# Patient Record
Sex: Male | Born: 1950
Health system: Southern US, Community
[De-identification: ages and names within clinical notes are randomized; demographics above are authoritative.]

## PROBLEM LIST (undated history)

## (undated) VITALS — BP 119/68 | HR 54 | Temp 97.3°F | Resp 18 | Ht 70.08 in | Wt 302.0 lb

## (undated) DIAGNOSIS — D124 Benign neoplasm of descending colon: Secondary | ICD-10-CM

## (undated) DIAGNOSIS — K429 Umbilical hernia without obstruction or gangrene: Secondary | ICD-10-CM

## (undated) DIAGNOSIS — R079 Chest pain, unspecified: Secondary | ICD-10-CM

## (undated) DIAGNOSIS — M2550 Pain in unspecified joint: Secondary | ICD-10-CM

## (undated) DIAGNOSIS — E119 Type 2 diabetes mellitus without complications: Secondary | ICD-10-CM

## (undated) DIAGNOSIS — I10 Essential (primary) hypertension: Secondary | ICD-10-CM

## (undated) DIAGNOSIS — E785 Hyperlipidemia, unspecified: Secondary | ICD-10-CM

## (undated) DIAGNOSIS — M25549 Pain in joints of unspecified hand: Secondary | ICD-10-CM

## (undated) DIAGNOSIS — I209 Angina pectoris, unspecified: Secondary | ICD-10-CM

## (undated) DIAGNOSIS — M75101 Unspecified rotator cuff tear or rupture of right shoulder, not specified as traumatic: Secondary | ICD-10-CM

## (undated) DIAGNOSIS — Z87442 Personal history of urinary calculi: Secondary | ICD-10-CM

## (undated) DIAGNOSIS — H9319 Tinnitus, unspecified ear: Secondary | ICD-10-CM

## (undated) DIAGNOSIS — Z7901 Long term (current) use of anticoagulants: Secondary | ICD-10-CM

## (undated) DIAGNOSIS — M5137 Other intervertebral disc degeneration, lumbosacral region: Secondary | ICD-10-CM

## (undated) DIAGNOSIS — N529 Male erectile dysfunction, unspecified: Secondary | ICD-10-CM

## (undated) DIAGNOSIS — M79644 Pain in right finger(s): Secondary | ICD-10-CM

## (undated) DIAGNOSIS — F419 Anxiety disorder, unspecified: Secondary | ICD-10-CM

## (undated) DIAGNOSIS — S82899B Other fracture of unspecified lower leg, initial encounter for open fracture type I or II: Secondary | ICD-10-CM

## (undated) DIAGNOSIS — Z955 Presence of coronary angioplasty implant and graft: Secondary | ICD-10-CM

## (undated) DIAGNOSIS — I441 Atrioventricular block, second degree: Secondary | ICD-10-CM

## (undated) DIAGNOSIS — E559 Vitamin D deficiency, unspecified: Secondary | ICD-10-CM

## (undated) DIAGNOSIS — R011 Cardiac murmur, unspecified: Secondary | ICD-10-CM

## (undated) DIAGNOSIS — M1990 Unspecified osteoarthritis, unspecified site: Secondary | ICD-10-CM

## (undated) DIAGNOSIS — L84 Corns and callosities: Secondary | ICD-10-CM

## (undated) DIAGNOSIS — M4802 Spinal stenosis, cervical region: Secondary | ICD-10-CM

## (undated) DIAGNOSIS — R001 Bradycardia, unspecified: Secondary | ICD-10-CM

## (undated) DIAGNOSIS — R1310 Dysphagia, unspecified: Secondary | ICD-10-CM

## (undated) DIAGNOSIS — D120 Benign neoplasm of cecum: Secondary | ICD-10-CM

## (undated) DIAGNOSIS — I499 Cardiac arrhythmia, unspecified: Secondary | ICD-10-CM

## (undated) DIAGNOSIS — R0602 Shortness of breath: Secondary | ICD-10-CM

## (undated) DIAGNOSIS — E669 Obesity, unspecified: Secondary | ICD-10-CM

## (undated) DIAGNOSIS — G4733 Obstructive sleep apnea (adult) (pediatric): Secondary | ICD-10-CM

## (undated) DIAGNOSIS — I2510 Atherosclerotic heart disease of native coronary artery without angina pectoris: Secondary | ICD-10-CM

## (undated) DIAGNOSIS — M12812 Other specific arthropathies, not elsewhere classified, left shoulder: Secondary | ICD-10-CM

## (undated) DIAGNOSIS — M19071 Primary osteoarthritis, right ankle and foot: Secondary | ICD-10-CM

## (undated) DIAGNOSIS — F329 Major depressive disorder, single episode, unspecified: Secondary | ICD-10-CM

## (undated) DIAGNOSIS — F418 Other specified anxiety disorders: Secondary | ICD-10-CM

## (undated) DIAGNOSIS — Z89511 Acquired absence of right leg below knee: Secondary | ICD-10-CM

## (undated) DIAGNOSIS — T7840XA Allergy, unspecified, initial encounter: Secondary | ICD-10-CM

## (undated) DIAGNOSIS — M25552 Pain in left hip: Secondary | ICD-10-CM

## (undated) DIAGNOSIS — M12811 Other specific arthropathies, not elsewhere classified, right shoulder: Secondary | ICD-10-CM

## (undated) DIAGNOSIS — E1151 Type 2 diabetes mellitus with diabetic peripheral angiopathy without gangrene: Secondary | ICD-10-CM

## (undated) DIAGNOSIS — E1165 Type 2 diabetes mellitus with hyperglycemia: Secondary | ICD-10-CM

## (undated) DIAGNOSIS — M75102 Unspecified rotator cuff tear or rupture of left shoulder, not specified as traumatic: Secondary | ICD-10-CM

## (undated) DIAGNOSIS — S82899A Other fracture of unspecified lower leg, initial encounter for closed fracture: Secondary | ICD-10-CM

## (undated) DIAGNOSIS — I219 Acute myocardial infarction, unspecified: Secondary | ICD-10-CM

## (undated) DIAGNOSIS — K869 Disease of pancreas, unspecified: Secondary | ICD-10-CM

## (undated) DIAGNOSIS — D649 Anemia, unspecified: Secondary | ICD-10-CM

## (undated) DIAGNOSIS — G4730 Sleep apnea, unspecified: Secondary | ICD-10-CM

## (undated) DIAGNOSIS — J189 Pneumonia, unspecified organism: Secondary | ICD-10-CM

## (undated) DIAGNOSIS — M797 Fibromyalgia: Secondary | ICD-10-CM

## (undated) DIAGNOSIS — M48062 Spinal stenosis, lumbar region with neurogenic claudication: Secondary | ICD-10-CM

## (undated) DIAGNOSIS — B1920 Unspecified viral hepatitis C without hepatic coma: Secondary | ICD-10-CM

## (undated) DIAGNOSIS — R0789 Other chest pain: Secondary | ICD-10-CM

## (undated) DIAGNOSIS — J309 Allergic rhinitis, unspecified: Secondary | ICD-10-CM

## (undated) DIAGNOSIS — G894 Chronic pain syndrome: Secondary | ICD-10-CM

## (undated) DIAGNOSIS — M5430 Sciatica, unspecified side: Secondary | ICD-10-CM

## (undated) DIAGNOSIS — R269 Unspecified abnormalities of gait and mobility: Secondary | ICD-10-CM

## (undated) DIAGNOSIS — G4700 Insomnia, unspecified: Secondary | ICD-10-CM

## (undated) DIAGNOSIS — R7303 Prediabetes: Secondary | ICD-10-CM

## (undated) DIAGNOSIS — R531 Weakness: Secondary | ICD-10-CM

## (undated) DIAGNOSIS — K219 Gastro-esophageal reflux disease without esophagitis: Secondary | ICD-10-CM

## (undated) DIAGNOSIS — R195 Other fecal abnormalities: Secondary | ICD-10-CM

## (undated) DIAGNOSIS — F32A Depression, unspecified: Secondary | ICD-10-CM

## (undated) DIAGNOSIS — N35919 Unspecified urethral stricture, male, unspecified site: Secondary | ICD-10-CM

## (undated) DIAGNOSIS — S92901A Unspecified fracture of right foot, initial encounter for closed fracture: Secondary | ICD-10-CM

## (undated) DIAGNOSIS — R2689 Other abnormalities of gait and mobility: Secondary | ICD-10-CM

## (undated) DIAGNOSIS — R7611 Nonspecific reaction to tuberculin skin test without active tuberculosis: Secondary | ICD-10-CM

## (undated) DIAGNOSIS — R0600 Dyspnea, unspecified: Secondary | ICD-10-CM

## (undated) HISTORY — DX: Type 2 diabetes mellitus with diabetic peripheral angiopathy without gangrene: E11.51

## (undated) HISTORY — DX: Prediabetes: R73.03

## (undated) HISTORY — DX: Atherosclerotic heart disease of native coronary artery without angina pectoris: I25.10

## (undated) HISTORY — DX: Unspecified fracture of right foot, initial encounter for closed fracture: S92.901A

## (undated) HISTORY — PX: URETHRAL DILATION: SUR417

## (undated) HISTORY — PX: ROTATOR CUFF REPAIR: SHX139

## (undated) HISTORY — DX: Benign neoplasm of descending colon: D12.4

## (undated) HISTORY — DX: Other intervertebral disc degeneration, lumbosacral region: M51.37

## (undated) HISTORY — PX: UMBILICAL HERNIA REPAIR: SHX196

## (undated) HISTORY — DX: Obstructive sleep apnea (adult) (pediatric): G47.33

## (undated) HISTORY — DX: Other fecal abnormalities: R19.5

## (undated) HISTORY — DX: Corns and callosities: L84

## (undated) HISTORY — DX: Insomnia, unspecified: G47.00

## (undated) HISTORY — DX: Essential (primary) hypertension: I10

## (undated) HISTORY — DX: Major depressive disorder, single episode, unspecified: F32.9

## (undated) HISTORY — DX: Hyperlipidemia, unspecified: E78.5

## (undated) HISTORY — DX: Vitamin D deficiency, unspecified: E55.9

## (undated) HISTORY — DX: Chest pain, unspecified: R07.9

## (undated) HISTORY — DX: Nonspecific reaction to tuberculin skin test without active tuberculosis: R76.11

## (undated) HISTORY — DX: Sciatica, unspecified side: M54.30

## (undated) HISTORY — DX: Unspecified osteoarthritis, unspecified site: M19.90

## (undated) HISTORY — DX: Other specific arthropathies, not elsewhere classified, right shoulder: M12.811

## (undated) HISTORY — PX: CARDIAC CATHETERIZATION: SHX172

## (undated) HISTORY — PX: POLYPECTOMY: SHX149

## (undated) HISTORY — DX: Presence of coronary angioplasty implant and graft: Z95.5

## (undated) HISTORY — DX: Chronic pain syndrome: G89.4

## (undated) HISTORY — DX: Gastro-esophageal reflux disease without esophagitis: K21.9

## (undated) HISTORY — DX: Male erectile dysfunction, unspecified: N52.9

## (undated) HISTORY — PX: WISDOM TOOTH EXTRACTION: SHX21

## (undated) HISTORY — DX: Anemia, unspecified: D64.9

## (undated) HISTORY — DX: Allergic rhinitis, unspecified: J30.9

## (undated) HISTORY — DX: Pain in joints of unspecified hand: M25.549

## (undated) HISTORY — DX: Type 2 diabetes mellitus with hyperglycemia: E11.65

## (undated) HISTORY — DX: Dysphagia, unspecified: R13.10

## (undated) HISTORY — DX: Long term (current) use of anticoagulants: Z79.01

## (undated) HISTORY — DX: Unspecified rotator cuff tear or rupture of left shoulder, not specified as traumatic: M75.102

## (undated) HISTORY — PX: CARPAL TUNNEL RELEASE: SHX101

## (undated) HISTORY — DX: Other fracture of unspecified lower leg, initial encounter for closed fracture: S82.899A

## (undated) HISTORY — PX: FRACTURE SURGERY: SHX138

## (undated) HISTORY — DX: Other fracture of unspecified lower leg, initial encounter for open fracture type I or II: S82.899B

## (undated) HISTORY — DX: Shortness of breath: R06.02

## (undated) HISTORY — DX: Disease of pancreas, unspecified: K86.9

## (undated) HISTORY — DX: Tinnitus, unspecified ear: H93.19

## (undated) HISTORY — DX: Acquired absence of right leg below knee: Z89.511

## (undated) HISTORY — PX: CORONARY ANGIOPLASTY WITH STENT PLACEMENT: SHX49

## (undated) HISTORY — DX: Sleep apnea, unspecified: G47.30

## (undated) HISTORY — DX: Obesity, unspecified: E66.9

## (undated) HISTORY — PX: TONSILLECTOMY: SUR1361

## (undated) HISTORY — DX: Primary osteoarthritis, right ankle and foot: M19.071

## (undated) HISTORY — PX: COLONOSCOPY: SHX174

## (undated) HISTORY — DX: Spinal stenosis, lumbar region with neurogenic claudication: M48.062

## (undated) HISTORY — DX: Atrioventricular block, second degree: I44.1

## (undated) HISTORY — DX: Pain in right finger(s): M79.644

## (undated) HISTORY — DX: Allergy, unspecified, initial encounter: T78.40XA

## (undated) HISTORY — DX: Unspecified rotator cuff tear or rupture of right shoulder, not specified as traumatic: M75.101

## (undated) HISTORY — DX: Other abnormalities of gait and mobility: R26.89

## (undated) HISTORY — PX: TOOTH EXTRACTION: SUR596

## (undated) HISTORY — PX: HERNIA REPAIR: SHX51

## (undated) HISTORY — DX: Benign neoplasm of cecum: D12.0

## (undated) HISTORY — DX: Pain in unspecified joint: M25.50

## (undated) HISTORY — DX: Unspecified urethral stricture, male, unspecified site: N35.919

## (undated) HISTORY — DX: Bradycardia, unspecified: R00.1

## (undated) HISTORY — DX: Other specific arthropathies, not elsewhere classified, left shoulder: M12.812

## (undated) HISTORY — PX: VASECTOMY: SHX75

## (undated) HISTORY — PX: KNEE CARTILAGE SURGERY: SHX688

## (undated) HISTORY — PX: SHOULDER ARTHROSCOPY W/ ROTATOR CUFF REPAIR: SHX2400

## (undated) HISTORY — DX: Unspecified abnormalities of gait and mobility: R26.9

## (undated) HISTORY — DX: Type 2 diabetes mellitus without complications: E11.9

## (undated) HISTORY — PX: JOINT REPLACEMENT: SHX530

## (undated) HISTORY — DX: Weakness: R53.1

## (undated) HISTORY — DX: Pain in left hip: M25.552

## (undated) HISTORY — DX: Anxiety disorder, unspecified: F41.9

## (undated) HISTORY — DX: Spinal stenosis, cervical region: M48.02

## (undated) HISTORY — PX: BACK SURGERY: SHX140

---

## 2006-05-22 DIAGNOSIS — I219 Acute myocardial infarction, unspecified: Secondary | ICD-10-CM

## 2006-05-22 HISTORY — DX: Acute myocardial infarction, unspecified: I21.9

## 2006-05-22 HISTORY — PX: TOTAL KNEE ARTHROPLASTY: SHX125

## 2008-05-22 LAB — HM COLONOSCOPY: HM Colonoscopy: NORMAL

## 2009-01-22 ENCOUNTER — Encounter: Payer: Self-pay | Admitting: Cardiology

## 2009-06-24 ENCOUNTER — Ambulatory Visit: Payer: Self-pay | Admitting: Internal Medicine

## 2009-06-24 DIAGNOSIS — R5383 Other fatigue: Secondary | ICD-10-CM

## 2009-06-24 DIAGNOSIS — I2510 Atherosclerotic heart disease of native coronary artery without angina pectoris: Secondary | ICD-10-CM | POA: Insufficient documentation

## 2009-06-24 DIAGNOSIS — J069 Acute upper respiratory infection, unspecified: Secondary | ICD-10-CM | POA: Insufficient documentation

## 2009-06-24 DIAGNOSIS — R5381 Other malaise: Secondary | ICD-10-CM | POA: Insufficient documentation

## 2009-06-24 DIAGNOSIS — M1990 Unspecified osteoarthritis, unspecified site: Secondary | ICD-10-CM | POA: Insufficient documentation

## 2009-06-24 DIAGNOSIS — J309 Allergic rhinitis, unspecified: Secondary | ICD-10-CM | POA: Insufficient documentation

## 2009-06-24 DIAGNOSIS — N35919 Unspecified urethral stricture, male, unspecified site: Secondary | ICD-10-CM | POA: Insufficient documentation

## 2009-06-24 DIAGNOSIS — M25519 Pain in unspecified shoulder: Secondary | ICD-10-CM | POA: Insufficient documentation

## 2009-06-24 DIAGNOSIS — M25579 Pain in unspecified ankle and joints of unspecified foot: Secondary | ICD-10-CM | POA: Insufficient documentation

## 2009-06-24 DIAGNOSIS — I10 Essential (primary) hypertension: Secondary | ICD-10-CM

## 2009-06-24 DIAGNOSIS — Z87442 Personal history of urinary calculi: Secondary | ICD-10-CM | POA: Insufficient documentation

## 2009-06-24 DIAGNOSIS — R079 Chest pain, unspecified: Secondary | ICD-10-CM | POA: Insufficient documentation

## 2009-06-24 HISTORY — DX: Atherosclerotic heart disease of native coronary artery without angina pectoris: I25.10

## 2009-06-24 HISTORY — DX: Essential (primary) hypertension: I10

## 2009-06-24 HISTORY — DX: Unspecified osteoarthritis, unspecified site: M19.90

## 2009-06-24 HISTORY — DX: Allergic rhinitis, unspecified: J30.9

## 2009-06-24 HISTORY — DX: Unspecified urethral stricture, male, unspecified site: N35.919

## 2009-07-07 ENCOUNTER — Telehealth: Payer: Self-pay | Admitting: Internal Medicine

## 2009-07-08 ENCOUNTER — Ambulatory Visit: Payer: Self-pay | Admitting: Internal Medicine

## 2009-07-08 DIAGNOSIS — H60399 Other infective otitis externa, unspecified ear: Secondary | ICD-10-CM | POA: Insufficient documentation

## 2009-07-15 ENCOUNTER — Encounter (HOSPITAL_COMMUNITY): Admission: RE | Admit: 2009-07-15 | Discharge: 2009-09-21 | Payer: Self-pay | Admitting: Cardiology

## 2009-07-15 ENCOUNTER — Ambulatory Visit: Payer: Self-pay

## 2009-07-15 ENCOUNTER — Ambulatory Visit: Payer: Self-pay | Admitting: Cardiology

## 2009-07-15 DIAGNOSIS — E785 Hyperlipidemia, unspecified: Secondary | ICD-10-CM

## 2009-07-15 HISTORY — DX: Hyperlipidemia, unspecified: E78.5

## 2009-07-16 ENCOUNTER — Telehealth: Payer: Self-pay | Admitting: Cardiology

## 2009-07-20 ENCOUNTER — Telehealth (INDEPENDENT_AMBULATORY_CARE_PROVIDER_SITE_OTHER): Payer: Self-pay | Admitting: *Deleted

## 2009-07-21 ENCOUNTER — Ambulatory Visit: Payer: Self-pay

## 2009-07-21 ENCOUNTER — Encounter (HOSPITAL_COMMUNITY): Admission: RE | Admit: 2009-07-21 | Discharge: 2009-09-21 | Payer: Self-pay | Admitting: Cardiology

## 2009-07-21 ENCOUNTER — Ambulatory Visit: Payer: Self-pay | Admitting: Cardiovascular Disease

## 2009-07-21 ENCOUNTER — Ambulatory Visit (HOSPITAL_COMMUNITY): Admission: RE | Admit: 2009-07-21 | Discharge: 2009-07-21 | Payer: Self-pay | Admitting: Cardiology

## 2009-07-21 ENCOUNTER — Encounter: Payer: Self-pay | Admitting: Cardiology

## 2009-07-29 ENCOUNTER — Telehealth (INDEPENDENT_AMBULATORY_CARE_PROVIDER_SITE_OTHER): Payer: Self-pay | Admitting: *Deleted

## 2009-08-05 ENCOUNTER — Telehealth: Payer: Self-pay | Admitting: Cardiology

## 2009-08-06 ENCOUNTER — Encounter: Payer: Self-pay | Admitting: Cardiovascular Disease

## 2009-08-06 ENCOUNTER — Ambulatory Visit (HOSPITAL_COMMUNITY): Admission: RE | Admit: 2009-08-06 | Discharge: 2009-08-06 | Payer: Self-pay | Admitting: Cardiovascular Disease

## 2009-08-10 ENCOUNTER — Ambulatory Visit: Payer: Self-pay | Admitting: Cardiology

## 2009-08-11 ENCOUNTER — Encounter: Payer: Self-pay | Admitting: Cardiology

## 2009-08-17 ENCOUNTER — Telehealth: Payer: Self-pay | Admitting: Cardiology

## 2009-08-18 ENCOUNTER — Telehealth: Payer: Self-pay | Admitting: Cardiology

## 2009-08-20 ENCOUNTER — Telehealth (INDEPENDENT_AMBULATORY_CARE_PROVIDER_SITE_OTHER): Payer: Self-pay | Admitting: *Deleted

## 2009-08-31 ENCOUNTER — Ambulatory Visit (HOSPITAL_COMMUNITY): Admission: RE | Admit: 2009-08-31 | Discharge: 2009-09-01 | Payer: Self-pay | Admitting: Orthopedic Surgery

## 2009-09-17 ENCOUNTER — Ambulatory Visit: Payer: Self-pay | Admitting: Internal Medicine

## 2009-10-08 ENCOUNTER — Encounter (INDEPENDENT_AMBULATORY_CARE_PROVIDER_SITE_OTHER): Payer: Self-pay | Admitting: *Deleted

## 2009-10-27 ENCOUNTER — Ambulatory Visit: Payer: Self-pay | Admitting: Internal Medicine

## 2009-10-27 DIAGNOSIS — G894 Chronic pain syndrome: Secondary | ICD-10-CM

## 2009-10-27 HISTORY — DX: Chronic pain syndrome: G89.4

## 2009-10-28 ENCOUNTER — Encounter: Payer: Self-pay | Admitting: Internal Medicine

## 2009-11-11 ENCOUNTER — Telehealth: Payer: Self-pay | Admitting: Internal Medicine

## 2009-12-03 ENCOUNTER — Encounter: Admission: RE | Admit: 2009-12-03 | Discharge: 2009-12-03 | Payer: Self-pay | Admitting: Orthopedic Surgery

## 2009-12-03 ENCOUNTER — Encounter: Payer: Self-pay | Admitting: Internal Medicine

## 2009-12-14 ENCOUNTER — Ambulatory Visit: Payer: Self-pay | Admitting: Cardiology

## 2009-12-17 ENCOUNTER — Ambulatory Visit: Payer: Self-pay | Admitting: Internal Medicine

## 2009-12-17 DIAGNOSIS — K1370 Unspecified lesions of oral mucosa: Secondary | ICD-10-CM | POA: Insufficient documentation

## 2009-12-20 ENCOUNTER — Telehealth: Payer: Self-pay | Admitting: Internal Medicine

## 2009-12-24 ENCOUNTER — Encounter
Admission: RE | Admit: 2009-12-24 | Discharge: 2010-03-24 | Payer: Self-pay | Admitting: Physical Medicine & Rehabilitation

## 2009-12-30 ENCOUNTER — Ambulatory Visit: Payer: Self-pay | Admitting: Physical Medicine & Rehabilitation

## 2010-01-03 ENCOUNTER — Encounter: Admission: RE | Admit: 2010-01-03 | Discharge: 2010-01-03 | Payer: Self-pay | Admitting: Orthopedic Surgery

## 2010-01-26 ENCOUNTER — Encounter: Payer: Self-pay | Admitting: Internal Medicine

## 2010-02-01 ENCOUNTER — Ambulatory Visit: Payer: Self-pay | Admitting: Physical Medicine & Rehabilitation

## 2010-03-03 ENCOUNTER — Telehealth: Payer: Self-pay | Admitting: Cardiology

## 2010-03-03 ENCOUNTER — Encounter: Payer: Self-pay | Admitting: Internal Medicine

## 2010-03-07 ENCOUNTER — Telehealth: Payer: Self-pay | Admitting: Cardiology

## 2010-03-14 ENCOUNTER — Ambulatory Visit: Payer: Self-pay | Admitting: Internal Medicine

## 2010-03-16 ENCOUNTER — Encounter (INDEPENDENT_AMBULATORY_CARE_PROVIDER_SITE_OTHER): Payer: Self-pay | Admitting: *Deleted

## 2010-04-18 ENCOUNTER — Telehealth (INDEPENDENT_AMBULATORY_CARE_PROVIDER_SITE_OTHER): Payer: Self-pay | Admitting: *Deleted

## 2010-04-19 ENCOUNTER — Ambulatory Visit: Payer: Self-pay | Admitting: Internal Medicine

## 2010-04-19 DIAGNOSIS — M5137 Other intervertebral disc degeneration, lumbosacral region: Secondary | ICD-10-CM

## 2010-04-19 DIAGNOSIS — M5430 Sciatica, unspecified side: Secondary | ICD-10-CM | POA: Insufficient documentation

## 2010-04-19 DIAGNOSIS — S8010XA Contusion of unspecified lower leg, initial encounter: Secondary | ICD-10-CM | POA: Insufficient documentation

## 2010-04-19 DIAGNOSIS — M51379 Other intervertebral disc degeneration, lumbosacral region without mention of lumbar back pain or lower extremity pain: Secondary | ICD-10-CM

## 2010-04-19 DIAGNOSIS — M5136 Other intervertebral disc degeneration, lumbar region: Secondary | ICD-10-CM | POA: Insufficient documentation

## 2010-04-19 HISTORY — DX: Other intervertebral disc degeneration, lumbosacral region without mention of lumbar back pain or lower extremity pain: M51.379

## 2010-04-19 HISTORY — DX: Other intervertebral disc degeneration, lumbosacral region: M51.37

## 2010-04-19 HISTORY — DX: Sciatica, unspecified side: M54.30

## 2010-04-19 LAB — CONVERTED CEMR LAB
Blood in Urine, dipstick: NEGATIVE
Glucose, Urine, Semiquant: NEGATIVE
Nitrite: NEGATIVE
Protein, U semiquant: NEGATIVE
pH: 5

## 2010-04-26 ENCOUNTER — Telehealth: Payer: Self-pay | Admitting: Cardiology

## 2010-04-28 ENCOUNTER — Encounter: Payer: Self-pay | Admitting: Internal Medicine

## 2010-05-22 DIAGNOSIS — E119 Type 2 diabetes mellitus without complications: Secondary | ICD-10-CM

## 2010-05-22 HISTORY — DX: Type 2 diabetes mellitus without complications: E11.9

## 2010-05-30 ENCOUNTER — Other Ambulatory Visit: Payer: Self-pay | Admitting: Internal Medicine

## 2010-05-30 ENCOUNTER — Ambulatory Visit
Admission: RE | Admit: 2010-05-30 | Discharge: 2010-05-30 | Payer: Self-pay | Source: Home / Self Care | Attending: Internal Medicine | Admitting: Internal Medicine

## 2010-05-30 DIAGNOSIS — E739 Lactose intolerance, unspecified: Secondary | ICD-10-CM | POA: Insufficient documentation

## 2010-05-30 DIAGNOSIS — N529 Male erectile dysfunction, unspecified: Secondary | ICD-10-CM

## 2010-05-30 DIAGNOSIS — R300 Dysuria: Secondary | ICD-10-CM | POA: Insufficient documentation

## 2010-05-30 HISTORY — DX: Male erectile dysfunction, unspecified: N52.9

## 2010-05-30 LAB — CBC WITH DIFFERENTIAL/PLATELET
Basophils Absolute: 0 10*3/uL (ref 0.0–0.1)
Basophils Relative: 0.4 % (ref 0.0–3.0)
Eosinophils Absolute: 0.1 10*3/uL (ref 0.0–0.7)
Eosinophils Relative: 1.4 % (ref 0.0–5.0)
HCT: 38.6 % — ABNORMAL LOW (ref 39.0–52.0)
Hemoglobin: 13 g/dL (ref 13.0–17.0)
Lymphocytes Relative: 20.2 % (ref 12.0–46.0)
Lymphs Abs: 1.3 10*3/uL (ref 0.7–4.0)
MCHC: 33.7 g/dL (ref 30.0–36.0)
MCV: 89.7 fl (ref 78.0–100.0)
Monocytes Absolute: 0.5 10*3/uL (ref 0.1–1.0)
Monocytes Relative: 7.7 % (ref 3.0–12.0)
Neutro Abs: 4.6 10*3/uL (ref 1.4–7.7)
Neutrophils Relative %: 70.3 % (ref 43.0–77.0)
Platelets: 256 10*3/uL (ref 150.0–400.0)
RBC: 4.31 Mil/uL (ref 4.22–5.81)
RDW: 14.8 % — ABNORMAL HIGH (ref 11.5–14.6)
WBC: 6.5 10*3/uL (ref 4.5–10.5)

## 2010-05-30 LAB — HEPATIC FUNCTION PANEL
ALT: 22 U/L (ref 0–53)
AST: 24 U/L (ref 0–37)
Albumin: 3.7 g/dL (ref 3.5–5.2)
Alkaline Phosphatase: 63 U/L (ref 39–117)
Bilirubin, Direct: 0.1 mg/dL (ref 0.0–0.3)
Total Bilirubin: 0.5 mg/dL (ref 0.3–1.2)
Total Protein: 6.4 g/dL (ref 6.0–8.3)

## 2010-05-30 LAB — URINALYSIS, ROUTINE W REFLEX MICROSCOPIC
Bilirubin Urine: NEGATIVE
Hemoglobin, Urine: NEGATIVE
Ketones, ur: NEGATIVE
Leukocytes, UA: NEGATIVE
Nitrite: NEGATIVE
Specific Gravity, Urine: 1.025 (ref 1.000–1.030)
Total Protein, Urine: NEGATIVE
Urine Glucose: NEGATIVE
Urobilinogen, UA: 0.2 (ref 0.0–1.0)
pH: 5.5 (ref 5.0–8.0)

## 2010-05-30 LAB — BASIC METABOLIC PANEL
BUN: 14 mg/dL (ref 6–23)
CO2: 29 mEq/L (ref 19–32)
Calcium: 9 mg/dL (ref 8.4–10.5)
Chloride: 104 mEq/L (ref 96–112)
Creatinine, Ser: 0.8 mg/dL (ref 0.4–1.5)
GFR: 104.03 mL/min (ref >60.00)
Glucose, Bld: 179 mg/dL — ABNORMAL HIGH (ref 70–99)
Potassium: 4.5 mEq/L (ref 3.5–5.1)
Sodium: 139 mEq/L (ref 135–145)

## 2010-05-30 LAB — TSH: TSH: 0.89 u[IU]/mL (ref 0.35–5.50)

## 2010-05-30 LAB — LIPID PANEL
Cholesterol: 119 mg/dL (ref 0–200)
HDL: 38.9 mg/dL — ABNORMAL LOW (ref >39.00)
LDL Cholesterol: 51 mg/dL (ref 0–99)
Total CHOL/HDL Ratio: 3
Triglycerides: 147 mg/dL (ref 0.0–149.0)
VLDL: 29.4 mg/dL (ref 0.0–40.0)

## 2010-05-30 LAB — HEMOGLOBIN A1C: Hgb A1c MFr Bld: 7 % — ABNORMAL HIGH (ref 4.6–6.5)

## 2010-05-31 ENCOUNTER — Encounter: Payer: Self-pay | Admitting: Internal Medicine

## 2010-06-08 ENCOUNTER — Encounter
Admission: RE | Admit: 2010-06-08 | Discharge: 2010-06-14 | Payer: Self-pay | Source: Home / Self Care | Attending: Physical Medicine & Rehabilitation | Admitting: Physical Medicine & Rehabilitation

## 2010-06-09 ENCOUNTER — Ambulatory Visit
Admission: RE | Admit: 2010-06-09 | Discharge: 2010-06-09 | Payer: Self-pay | Source: Home / Self Care | Attending: Internal Medicine | Admitting: Internal Medicine

## 2010-06-09 DIAGNOSIS — H66019 Acute suppurative otitis media with spontaneous rupture of ear drum, unspecified ear: Secondary | ICD-10-CM | POA: Insufficient documentation

## 2010-06-14 ENCOUNTER — Ambulatory Visit
Admission: RE | Admit: 2010-06-14 | Discharge: 2010-06-14 | Payer: Self-pay | Source: Home / Self Care | Attending: Physical Medicine & Rehabilitation | Admitting: Physical Medicine & Rehabilitation

## 2010-06-15 ENCOUNTER — Telehealth: Payer: Self-pay | Admitting: Internal Medicine

## 2010-06-19 LAB — CONVERTED CEMR LAB
AST: 27 units/L (ref 0–37)
BUN: 18 mg/dL (ref 6–23)
Basophils Relative: 0.4 % (ref 0.0–3.0)
Creatinine, Ser: 1.1 mg/dL (ref 0.4–1.5)
Eosinophils Absolute: 0.2 10*3/uL (ref 0.0–0.7)
GFR calc non Af Amer: 73.3 mL/min (ref >60)
HCT: 38.4 % — ABNORMAL LOW (ref 39.0–52.0)
LDL Cholesterol: 45 mg/dL (ref 0–99)
MCV: 91 fL (ref 78.0–100.0)
Monocytes Absolute: 0.5 10*3/uL (ref 0.1–1.0)
Monocytes Relative: 9.2 % (ref 3.0–12.0)
Neutro Abs: 3.7 10*3/uL (ref 1.4–7.7)
Platelets: 274 10*3/uL (ref 150.0–400.0)
RBC: 4.21 M/uL — ABNORMAL LOW (ref 4.22–5.81)
Total Bilirubin: 0.4 mg/dL (ref 0.3–1.2)
Total Protein: 6.6 g/dL (ref 6.0–8.3)

## 2010-06-20 ENCOUNTER — Ambulatory Visit: Admit: 2010-06-20 | Payer: Self-pay

## 2010-06-21 ENCOUNTER — Encounter: Payer: Self-pay | Admitting: Internal Medicine

## 2010-06-21 ENCOUNTER — Other Ambulatory Visit: Payer: Self-pay | Admitting: Physical Medicine & Rehabilitation

## 2010-06-21 DIAGNOSIS — M545 Low back pain, unspecified: Secondary | ICD-10-CM

## 2010-06-22 ENCOUNTER — Other Ambulatory Visit: Payer: Self-pay

## 2010-06-22 ENCOUNTER — Encounter: Payer: Self-pay | Admitting: Physical Medicine & Rehabilitation

## 2010-06-23 ENCOUNTER — Other Ambulatory Visit: Payer: Self-pay

## 2010-06-23 NOTE — Consult Note (Signed)
Summary: [facility]  [facility]   Imported By: Bubba Hales 11/02/2009 13:07:31  _____________________________________________________________________  External Attachment:    Type:   Image     Comment:   External Document

## 2010-06-23 NOTE — Assessment & Plan Note (Signed)
Summary: per phone note, sciatic pain/cd   Vital Signs:  Patient profile:   60 year old male Height:      72 inches Weight:      364 pounds BMI:     49.55 O2 Sat:      97 % on Room air Temp:     99.1 degrees F oral Pulse rate:   48 / minute BP sitting:   128 / 90  (left arm) Cuff size:   large  Vitals Entered By: Shirlean Mylar Ewing CMA Deborra Medina) (April 19, 2010 10:26 AM)  O2 Flow:  Room air CC: Low Back pain/RE   Primary Care Provider:  Dr. Jenny Reichmann  CC:  Low Back pain/RE.  History of Present Illness: here with low mid back pain, flared x 10 days, seemed to start very soon after a fall out of bed one monrning (still with contusion to left lateral hip and leg);  better with uriantion, worse with bowel movement but no dysuria, freq, urgency, hemurita;  does have mild constipatoin as well;  pain to the lower back worse on the left; radiates to the left buttock only on top of chronic pain to the left lateral leg (? IT band syndrome vs nervie pinch);  but no other LE pain/weak/ numbness;  no fever, wt loss,  gait change or fall (has chronic dizzy/imbalance occasionally chronic);  Has known lumbar disc dz with l5-6 per pt; last MRI > 2 yrs; no prior surgury; has had ESI in the past (mult over the yrs - about 6), last saw Gioffre for neck and shoulder reguraly, but no other eval for the lumbar since last dec 2010.  pain level about 10/10 with severe flare, but 6/10 with sitting quitly.  Pain worse to lying down, not worse to standing.  UA dip in the office - neg. Pt denies CP, worsening sob, doe, wheezing, orthopnea, pnd, worsening LE edema, palps, dizziness or syncope  Pt denies CP, worsening sob, doe, wheezing, orthopnea, pnd, worsening LE edema, palps, dizziness or syncope Pt denies other new neuro symptoms such as headache, facial or extremity weakness  Denies worsening depressive symptoms, suicidal ideation, or panic.    Problems Prior to Update: 1)  Contusion, Leg  (ICD-924.5) 2)  Sciatica, Left   (ICD-724.3) 3)  Disc Disease, Lumbar  (ICD-722.52) 4)  Other&unspecified Diseases The Oral Soft Tissues  (ICD-528.9) 5)  Chronic Pain Syndrome  (ICD-338.4) 6)  Hyperlipidemia-mixed  (ICD-272.4) 7)  Otitis Externa, Left  (ICD-380.10) 8)  Coronary Artery Disease  (ICD-414.00) 9)  Hypertension  (ICD-401.9) 10)  Chest Pain  (ICD-786.50) 11)  Depression  (ICD-311) 12)  Fatigue  (ICD-780.79) 13)  Special Screening Malig Neoplasms Other Sites  (ICD-V76.49) 14)  Uri  (ICD-465.9) 15)  Ankle Pain, Right  (ICD-719.47) 16)  Shoulder Pain, Bilateral  (ICD-719.41) 17)  Urethral Stricture  (ICD-598.9) 18)  Preventive Health Care  (ICD-V70.0) 19)  Nephrolithiasis, Hx of  (ICD-V13.01) 20)  Allergic Rhinitis  (ICD-477.9) 21)  Degenerative Joint Disease  (ICD-715.90)  Medications Prior to Update: 1)  Gabapentin 800 Mg Tabs (Gabapentin) .[name].. 1 By Mouth Three Times A Day 2)  Atenolol 25 Mg Tabs (Atenolol) .[name].. 1po Once Daily 3)  Oxycodone Hcl 15 Mg Tabs (Oxycodone Hcl) .[name].. 1 By Mouth Four Times Per Day - To Fill Sept 27, 2011 4)  Cymbalta 60 Mg Cpep (Duloxetine Hcl) .[name].. 1 By Mouth Once Daily 5)  Mirtazapine 30 Mg Tabs (Mirtazapine) .[name].. 1 By Mouth At Bedtime 6)  Effient  10 Mg Tabs (Prasugrel Hcl) .... Take Three Tablets The First Day Then One Tablet Daily 7)  Benazepril Hcl 20 Mg Tabs (Benazepril Hcl) .[name].. 1po Once Daily 8)  Flexeril 5 Mg Tabs (Cyclobenzaprine Hcl) .... Generic - 1 By Mouth Three Times A Day As Needed 9)  Crestor 10 Mg Tabs (Rosuvastatin Calcium) .[name].. 1po Once Daily 10)  Amlodipine Besylate 5 Mg Tabs (Amlodipine Besylate) .[name].. 1p O Once Daily 11)  Zolpidem Tartrate 10 Mg Tabs (Zolpidem Tartrate) .... 2 By Mouth At Bedtime As Needed 12)  Nitrofurantoin Macrocrystal 50 Mg Caps (Nitrofurantoin Macrocrystal) .[name].. 1 By Mouth Once Daily 13)  Nasonex 50 Mcg/act Susp (Mometasone Furoate) .... 2 Spray/side Per Day 14)  Lidoderm 5 % Ptch (Lidocaine) .[name].. 1 Patch Three Times A Day As Needed  Pain 15)  Voltaren 1 % Gel (Diclofenac Sodium) .... Use Asd Three Times A Day 16)  Nitrostat 0.3 Mg Subl (Nitroglycerin) .... Use Asd As Needed 17)  Aspir-Low 81 Mg Tbec (Aspirin) .... One Tablet Daily 18)  Indomethacin 25 Mg Caps (Indomethacin) .... 2 By Mouth Daily 19)  Viagra 100 Mg Tabs (Sildenafil Citrate) .... As Needed  Current Medications (verified): 1)  Gabapentin 800 Mg Tabs (Gabapentin) .[name].. 1 By Mouth Three Times A Day 2)  Atenolol 25 Mg Tabs (Atenolol) .[name].. 1po Once Daily 3)  Oxycodone Hcl 15 Mg Tabs (Oxycodone Hcl) .[name].. 1 By Mouth Four Times Per Day - To Fill Sept 27, 2011 4)  Cymbalta 60 Mg Cpep (Duloxetine Hcl) .[name].. 1 By Mouth Once Daily 5)  Mirtazapine 30 Mg Tabs (Mirtazapine) .[name].. 1 By Mouth At Bedtime 6)  Effient 10 Mg Tabs (Prasugrel Hcl) .... Take Three Tablets The First Day Then One Tablet Daily 7)  Benazepril Hcl 20 Mg Tabs (Benazepril Hcl) .[name].. 1po Once Daily 8)  Flexeril 5 Mg Tabs (Cyclobenzaprine Hcl) .... Generic - 1 By Mouth Three Times A Day As Needed 9)  Crestor 10 Mg Tabs (Rosuvastatin Calcium) .[name].. 1po Once Daily 10)  Amlodipine Besylate 5 Mg Tabs (Amlodipine Besylate) .[name].. 1p O Once Daily 11)  Zolpidem Tartrate 10 Mg Tabs (Zolpidem Tartrate) .... 2 By Mouth At Bedtime As Needed 12)  Nitrofurantoin Macrocrystal 50 Mg Caps (Nitrofurantoin Macrocrystal) .[name].. 1 By Mouth Once Daily 13)  Nasonex 50 Mcg/act Susp (Mometasone Furoate) .... 2 Spray/side Per Day 14)  Lidoderm 5 % Ptch (Lidocaine) .[name].. 1 Patch Three Times A Day As Needed Pain 15)  Voltaren 1 % Gel (Diclofenac Sodium) .... Use Asd Three Times A Day 16)  Nitrostat 0.3 Mg Subl (Nitroglycerin) .... Use Asd As Needed 17)  Aspir-Low 81 Mg Tbec (Aspirin) .... One Tablet Daily 18)  Indomethacin 25 Mg Caps (Indomethacin) .... 2 By Mouth Daily 19)  Viagra 100 Mg Tabs (Sildenafil Citrate) .... As Needed 20)  Prednisone 10 Mg Tabs (Prednisone) .... 4po Qd For 3days, Then 3po Qd For 3days, Then 2po Qd For 3days,  Then 1po Qd For 3 Days, Then Stop  Allergies (verified): No Known Drug Allergies  Past History:  Past Surgical History: Last updated: 09/17/2009 Tonsillectomy Rotator cuff repair - bilat s/p knee replacement 2008 s/p bilat wrist surgury s/p urethral dilations , mult in 2009, 2010 Carpal tunnel release s/p right knee arthroscopy 03/2005 and mult prior s/p umbilical hernia s/p left knee arthroscopy june 2002 and mult prior s/p right shoulder rotater cuff surgury apr 2011 - Dr [name] Lighter  Social History: Last updated: 07/15/2009 Divorced, moved here from [place].  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   Risk Factors: Smoking Status: quit (06/24/2009)  Past Medical History: 1. CORONARY ARTERY DISEASE (ICD-414.00): Has had multiple PCIs, reports he never had an MI (just angina).  12/08 had Taxus DES to mLAD, Endeavor DES to [facility] and to dCFX.  11/09 had BMS x 2 to mid RCA.  1/09 had Xience DES to dCFX, mCFX, and pCFX.  Lexiscan myoview (3/11): EF 50%, apical hypokinesis, possible small inferoapical infarct with no ischemia.  Low risk.  2.  HYPERTENSION (ICD-401.9) 3. DEPRESSION (ICD-311) 4. URETHRAL STRICTURE (ICD-598.9) 5. NEPHROLITHIASIS, HX OF (ICD-V13.01) 6. ALLERGIC RHINITIS (ICD-477.9) 7. DEGENERATIVE JOINT DISEASE (ICD-715.90): s/p bilateral TKRs 8. OSA: Not using CPAP very often.  9. Hyperlipidemia 10. Obesity 11. Echo (3/11): EF 55-60%, normal wall motion, grade I diastolic dysfunction, normal valves, normal RV.  12. Clopidogrel resistant 13. Rotator cuff repair 4/11 Lumbar Disc disease  Review of Systems       all otherwise negative per pt -    Physical Exam  General:  alert and overweight-appearing.   Head:  normocephalic and atraumatic.   Eyes:  vision grossly intact, pupils equal, and pupils round.    Ears:  R ear normal and L ear normal.   Nose:  no external deformity and no nasal discharge.   Mouth:  no gingival abnormalities and pharynx pink and moist.   Neck:  supple and no masses.   Lungs:  normal respiratory effort and normal breath sounds.   Heart:  normal rate and regular rhythm.   Abdomen:  soft, non-tender, and normal bowel sounds.   Msk:  no joint tenderness and no joint swelling.  , spine nontender , does have mild left lower lateral lumbar paravertebral tenderness Extremities:  trace edema bilat, no ulcers Neurologic:  cranial nerves II-XII intact and strength normal in all extremities.  sensation intact to light touch and gait normal.   Skin:  approx 3 cm area contusion to area left lateral thigh just below the left greater trochanter, mild tender   Impression & Recommendations:  Problem # 1:  SCIATICA, LEFT (ICD-724.3)  His updated medication list for this problem includes:    Oxycodone Hcl 15 Mg Tabs (Oxycodone hcl) .[name]... 1 by mouth four times per day - to fill sept 27, 2011    Flexeril 5 Mg Tabs (Cyclobenzaprine hcl) .[name]... Generic - 1 by mouth three times a day as needed    Aspir-low 81 Mg Tbec (Aspirin) ..... One tablet daily    Indomethacin 25 Mg Caps (Indomethacin) .[name]... 2 by mouth daily flare for 10 days, has pain meds per pain clinic which help, for today will do depomedrol IM , and predpack for home  Orders: Depo- Medrol 20m (J1030) Depo- Medrol 886m(J1040) Admin of Therapeutic Inj  intramuscular or subcutaneous ([id number] Problem # 2:  CONTUSION, LEG (ICD-924.5) mild left lateral leg; ok to follow  Problem # 3:  HYPERTENSION (ICD-401.9)  His updated medication list for this problem includes:    Atenolol 25 Mg Tabs (Atenolol) ...[name]. 1po once daily    Benazepril Hcl 20 Mg Tabs (Benazepril hcl) ...[name]. 1po once daily    Amlodipine Besylate 5 Mg Tabs (Amlodipine besylate) ...[name]. 1p o once daily  BP today: 128/90 Prior BP: 128/72 (12/17/2009)  Labs  Reviewed: K+: 4.9 (07/15/2009) Creat: : 1.1 (07/15/2009)  Chol: 98 (07/15/2009)   HDL: 41.70 (07/15/2009)   LDL: 45 (07/15/2009)   TG: 59.0 (07/15/2009) stable overall by hx and exam, ok to continue meds/tx as is   Problem # 4:  DEPRESSION (ICD-311)  His updated medication list for this problem includes:    Cymbalta 60 Mg Cpep (Duloxetine hcl) .[name]... 1 by mouth once daily    Mirtazapine 30 Mg Tabs (Mirtazapine) .[name]... 1 by mouth at bedtime stable overall by hx and exam, ok to continue meds/tx as is - no need for change at this time, declines counseling  Complete Medication List: 1)  Gabapentin 800 Mg Tabs (Gabapentin) .[name].. 1 by mouth three times a day 2)  Atenolol 25 Mg Tabs (Atenolol) .[name].. 1po once daily 3)  Oxycodone Hcl 15 Mg Tabs (Oxycodone hcl) .[name].. 1 by mouth four times per day - to fill sept 27, 2011 4)  Cymbalta 60 Mg Cpep (Duloxetine hcl) .[name].. 1 by mouth once daily 5)  Mirtazapine 30 Mg Tabs (Mirtazapine) .[name].. 1 by mouth at bedtime 6)  Effient 10 Mg Tabs (Prasugrel hcl) .... Take three tablets the first day then one tablet daily 7)  Benazepril Hcl 20 Mg Tabs (Benazepril hcl) .[name].. 1po once daily 8)  Flexeril 5 Mg Tabs (Cyclobenzaprine hcl) .... Generic - 1 by mouth three times a day as needed 9)  Crestor 10 Mg Tabs (Rosuvastatin calcium) .[name].. 1po once daily 10)  Amlodipine Besylate 5 Mg Tabs (Amlodipine besylate) .[name].. 1p o once daily 11)  Zolpidem Tartrate 10 Mg Tabs (Zolpidem tartrate) .... 2 by mouth at bedtime as needed 12)  Nitrofurantoin Macrocrystal 50 Mg Caps (Nitrofurantoin macrocrystal) .[name].. 1 by mouth once daily 13)  Nasonex 50 Mcg/act Susp (Mometasone furoate) .... 2 spray/side per day 14)  Lidoderm 5 % Ptch (Lidocaine) .[name].. 1 patch three times a day as needed pain 15)  Voltaren 1 % Gel (Diclofenac sodium) .... Use asd three times a day 16)  Nitrostat 0.3 Mg Subl (Nitroglycerin) .... Use asd as needed 17)  Aspir-low 81 Mg Tbec (Aspirin) .... One tablet daily 18)   Indomethacin 25 Mg Caps (Indomethacin) .... 2 by mouth daily 19)  Viagra 100 Mg Tabs (Sildenafil citrate) .... As needed 20)  Prednisone 10 Mg Tabs (Prednisone) .... 4po qd for 3days, then 3po qd for 3days, then 2po qd for 3days, then 1po qd for 3 days, then stop  Other Orders: UA Dipstick W/ Micro (manual) (81000)  Patient Instructions: 1)  you had the steroid shot today 2)  Please take all new medications as prescribed - the prednisone for home 3)  Continue all previous medications as before this visit  4)  Please schedule a follow-up appointment in 4 months, or sooner if needed 5)  If the pain does not improve, or worsens, you may need to consider followup with Dr [name] Lighter Prescriptions: PREDNISONE 10 MG TABS (PREDNISONE) 4po qd for 3days, then 3po qd for 3days, then 2po qd for 3days, then 1po qd for 3 days, then stop  #30 x 0   Entered and Authorized by:   [name] MD   Signed by:   [name] MD on 04/19/2010   Method used:   Electronically to        [id number] (retail)       [place], [place]  [address]       Ph: [phone]       Fax:  [phone]   RxID:   [id number]    Medication Administration  Injection # 1:    Medication: Depo- Medrol 25m    Diagnosis: SCIATICA, LEFT (ICD-724.3)    Route: IM    Site: LUOQ gluteus    Exp Date: 10/2012    Lot #: [id number]    Mfr: Pharmacia    Comments: patient received 1249mDepo-medrol    Patient tolerated injection without complications    Given by: RoShirlean Mylarwing CMA (ADeborra Medina(April 19, 2010 11:44 AM)  Injection # 2:    Medication: Depo- Medrol 8050m  Diagnosis: SCIATICA, LEFT (ICD-724.3)    Route: IM    Site: LUOQ gluteus    Exp Date: 10/2012    Lot #: [id number]    Mfr: Pharmacia    Given by: RobShirlean Mylaring CMA (AADeborra MedinaNovember 29, 2011 11:44 AM)  Orders Added: 1)  UA Dipstick W/ Micro (manual) [81000] 2)  Depo- Medrol 49m59m1030] 3)  Depo- Medrol 80mg95m040] 4)  Admin of  Therapeutic Inj  intramuscular or subcutaneous [96372] 5)  Est. Patient Level IV [9921[21947]aboratory Results   Urine Tests    Routine Urinalysis   Color: yellow Appearance: Clear Glucose: negative   (Normal Range: Negative) Bilirubin: negative   (Normal Range: Negative) Ketone: negative   (Normal Range: Negative) Spec. Gravity: 1.020   (Normal Range: 1.003-1.035) Blood: negative   (Normal Range: Negative) pH: 5.0   (Normal Range: 5.0-8.0) Protein: negative   (Normal Range: Negative) Urobilinogen: 1.0   (Normal Range: 0-1) Nitrite: negative   (Normal Range: Negative) Leukocyte Esterace: negative   (Normal Range: Negative)

## 2010-06-23 NOTE — Assessment & Plan Note (Signed)
Summary: np6/chest pain/jml   Primary Provider:  Dr. Jenny Reichmann  CC:  chest pain/new patient.  Pt states he needs general heart check.  History of Present Illness: 60 yo with history of CAD s/p multiple PCIs and obesity presents to establish cardiology care.  He recently moved here from [place] after a divorce.  He is living with his brother.  He brings the cards for a total of 8 stents placed in 12/08, 1/09, and 11/09.  No cath since 11/09.  He never had an MI, procedures were done due to angina.  During his move last month, he noted fairly frequent exertional chest pain relieved by NTG.  He would get substernal tightness walking his dog up a hill or with moderate exertion like carrying boxes. This has actually resolved and he has not had any chest pain with moderate exertion for about a month now.  He is very limited by orthopedic pain (foot and knees).  He is morbidly obese.  He gets mildly short of breath walking up a flight of steps.  No orthopnea or PND.  He has sleep apnea but is rarely using his CPAP.  No ihistory of CHF.   ECG: NSR, normal  No labs    Current Medications (verified): 1)  Isosorbide Mononitrate Cr 60 Mg Xr24h-Tab (Isosorbide Mononitrate) .... One Tablet Daily 2)  Gabapentin 800 Mg Tabs (Gabapentin) .[name].. 1 By Mouth Three Times A Day 3)  Atenolol 25 Mg Tabs (Atenolol) .[name].. 1po Once Daily 4)  Oxycodone Hcl 15 Mg Tabs (Oxycodone Hcl) .[name].. 1 By Mouth Four Times Per Day - To Fill Aug 19, 2009 5)  Cymbalta 60 Mg Cpep (Duloxetine Hcl) .[name].. 1 By Mouth Once Daily 6)  Mirtazapine 30 Mg Tabs (Mirtazapine) .[name].. 1 By Mouth At Bedtime 7)  Plavix 75 Mg Tabs (Clopidogrel Bisulfate) .[name].. 1po Once Daily 8)  Benazepril Hcl 20 Mg Tabs (Benazepril Hcl) .[name].. 1po Once Daily 9)  Skelaxin 800 Mg Tabs (Metaxalone) .[name].. 1 By Mouth Four Times Per Day 10)  Meloxicam 15 Mg Tabs (Meloxicam) .[name].. 1po Two Times A Day 11)  Crestor 10 Mg Tabs (Rosuvastatin Calcium) .[name].. 1po Once Daily 12)  Amlodipine  Besylate 5 Mg Tabs (Amlodipine Besylate) .[name].. 1p O Once Daily 13)  Zolpidem Tartrate 10 Mg Tabs (Zolpidem Tartrate) .... 2 By Mouth At Bedtime As Needed 14)  Nitrofurantoin Macrocrystal 50 Mg Caps (Nitrofurantoin Macrocrystal) .[name].. 1 By Mouth Once Daily 15)  Nasonex 50 Mcg/act Susp (Mometasone Furoate) .... 2 Spray/side Per Day 16)  Lidoderm 5 % Ptch (Lidocaine) .[name].. 1 Patch Three Times A Day As Needed Pain 17)  Voltaren 1 % Gel (Diclofenac Sodium) .... Use Asd Three Times A Day 18)  Nitrostat 0.3 Mg Subl (Nitroglycerin) .... Use Asd As Needed 19)  Ecotrin 325 Mg Tbec (Aspirin) .[name].. 1po Once Daily 20)  Cortisporin 3.5-10000-1 Soln (Neomycin-Polymyxin-Hc) .... Use Asd 2 Gtt's Four Times Per Day Left Ear For 10 Days 21)  Tessalon Perles 100 Mg Caps (Benzonatate) .[name].. 1 - 2 By Mouth Three Times A Day As Needed Cough  Allergies (verified): No Known Drug Allergies  Past History:  Past Medical History: 1. CORONARY ARTERY DISEASE (ICD-414.00): Has had multiple PCIs, reports he never had an MI (just angina).  12/08 had Taxus DES to mLAD, Endeavor DES to [facility] and to dCFX.  11/09 had BMS x 2 to mid RCA.  1/09 had Xience DES to dCFX, mCFX, and pCFX.   2.  HYPERTENSION (ICD-401.9) 3. DEPRESSION (ICD-311) 4.  URETHRAL STRICTURE (ICD-598.9) 5. NEPHROLITHIASIS, HX OF (ICD-V13.01) 6. ALLERGIC RHINITIS (ICD-477.9) 7. DEGENERATIVE JOINT DISEASE (ICD-715.90): s/p bilateral TKRs 8. OSA: Not using CPAP very often.  9. Hyperlipidemia 10. Obesity  Family History: Reviewed history from 06/28/2009 and no changes required. mother with met cancer, unknown primary, DJD, heart disease, depression, HTN, breast cancer father with DM, heart disease/CABG,  skin cancers, HTN, elev chol, and prostate cancer grandparent wih DJD, depression, CAD, HTN brother x 2 with HTN, depression  Social History: Divorced, moved here from [place].  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   Review of Systems       All systems reviewed and negative except as per HPI.   Vital Signs:  Patient profile:   60 year old male Height:      72 inches Weight:      370 pounds BMI:     50.36 Pulse rate:   52 / minute Pulse rhythm:   regular BP sitting:   138 / 74  (left arm) Cuff size:   regular  Vitals Entered By: Doug Sou CMA (July 15, 2009 9:43 AM)  Physical Exam  General:  Well developed, well nourished, in no acute distress.  Morbidly obese.  Head:  normocephalic and atraumatic Nose:  no deformity, discharge, inflammation, or lesions Mouth:  Teeth, gums and palate normal. Oral mucosa normal. Neck:  Neck thick, no JVD. No masses, thyromegaly or abnormal cervical nodes. Lungs:  Clear bilaterally to auscultation and percussion. Heart:  Non-displaced PMI, chest non-tender; regular rate and rhythm, S1, S2 without murmurs, rubs or gallops. Carotid upstroke normal, no bruit. Pedals normal pulses. 1+ ankle edema.   Abdomen:  Bowel sounds positive; abdomen soft and non-tender without masses, organomegaly, or hernias noted. No hepatosplenomegaly.  Obese.  Msk:  Back normal, normal gait. Muscle strength and tone normal. Extremities:  No clubbing or cyanosis. Neurologic:  Alert and oriented x 3. Skin:  Intact without lesions or rashes. Psych:  Normal affect.   Impression & Recommendations:  Problem # 1:  CORONARY ARTERY DISEASE (ICD-414.00) Patient has CAD s/p multiple multiple PCIs.  He has the cards for a total of 8 stents, most recently in 11/09.  He was having exertional chest pain last month during his move from [place] but has been pain-free this month.   - ETT-myoview to assess for ischemia.  - Increase Imdur to 60 mg daily to increase anginal threshold.  - Will send for platelet function testing using VerifyNow device to see if he responds  adequately to Plavix given his multiple stents.   - Continue ASA, ACEI, beta blocker - Echocardiogram to assess LV and RV function.   Problem # 2:  HYPERTENSION (ICD-401.9) BP appears reasonably well-controlled.   Problem # 3:  HYPERLIPIDEMIA-MIXED (BMW-413.4) Check lipids and LFTs today.   Problem # 4:  OBESITY Weight loss is going to be imperative.  He is going to have a difficult time exercising due to knee and foot pain.  He plans to try water aerobics.    Other Orders: EKG w/ Interpretation (93000) Echocardiogram (Echo) Nuclear Stress Test (Nuc Stress Test) TLB-BMP (Basic Metabolic Panel-BMET) (24401-UUVOZDG) TLB-Lipid Panel (80061-LIPID) TLB-CBC Platelet - w/Differential (85025-CBCD) TLB-Hepatic/Liver Function Pnl (80076-HEPATIC)  Patient Instructions: 1)  Your physician has recommended you make the following change in your medication:  2)  Increase Imdur to 37m daily 3)  RESTART CPAP 4)  Your physician recommends that you return for a FASTING lipid profile/liver profile/CBC/bmp  786.50 414.01  5)  Have P2Y12 (verify now) blood test --you have the order--[facility]  6)    7)  Your physician has requested that you have an echocardiogram.  Echocardiography is a painless test that uses sound waves to create images of your heart. It provides your doctor with information about the size and shape of your heart and how well your heart's chambers and valves are working.  This procedure takes approximately one hour. There are no restrictions for this procedure. 8)  Your physician has requested that you have an lOrason  For further information please visit [email or web address]  Please follow instruction sheet, as given. 9)  Your physician recommends that you schedule a follow-up appointment in: 1 month with Dr MAundra Dubin Prescriptions: ISOSORBIDE MONONITRATE CR 60 MG XR24H-TAB (ISOSORBIDE MONONITRATE) one tablet daily  #30  x 6   Entered by:   ADesiree [name] RN, BSN   Authorized by:   DLoralie Champagne MD   Signed by:   ADesiree [name] RN, BSN on 07/15/2009   Method used:   Electronically to        [id number] (retail)       [address]   [id number]      Ph: [phone]      Fax: [phone]  RxID:   [id number]

## 2010-06-23 NOTE — Progress Notes (Signed)
  Faxed all Cardiac over to [facility] to [facility] [place]  August 20, 2009 9:08 AM

## 2010-06-23 NOTE — Progress Notes (Signed)
Summary: medication change  Phone Note From Pharmacy   Caller: [place]. #[phone]* Summary of Call: Pharmacy requesting to change Skelaxin to something else as not available Initial call taken by: Robin Ewing CMA Deborra Medina),  December 20, 2009 3:59 PM  Follow-up for Phone Call        faxed hardcopy to [facility] [address] Follow-up by: Shirlean Mylar Ewing CMA ([place]),  December 20, 2009 4:28 PM    New/Updated Medications: FLEXERIL 5 MG TABS (CYCLOBENZAPRINE HCL) generic - 1 by mouth three times a day as needed Prescriptions: FLEXERIL 5 MG TABS (CYCLOBENZAPRINE HCL) generic - 1 by mouth three times a day as needed  #90 x 2   Entered and Authorized by:   [name] MD   Signed by:   [name] MD on 12/20/2009   Method used:   Print then Give to Patient   RxID:   [id number]  done hardcopy to [facility] side B - dahlia  [name] MD  December 20, 2009 4:15 PM

## 2010-06-23 NOTE — Letter (Signed)
Summary: Health Exam form/[employer] Exam form/[employer]   Imported By: Phillis Knack 12/20/2009 14:31:05  _____________________________________________________________________  External Attachment:    Type:   Image     Comment:   External Document

## 2010-06-23 NOTE — Assessment & Plan Note (Signed)
Summary: BODY PAIN  STC   Vital Signs:  Patient profile:   60 year old male Height:      72 inches Weight:      373 pounds BMI:     50.77 O2 Sat:      97 % on Room air Temp:     99.2 degrees F oral Pulse rate:   52 / minute BP sitting:   132 / 80  (left arm) Cuff size:   large  Vitals Entered ByShirlean Mylar Ewing (October 27, 2009 8:12 AM)  O2 Flow:  Room air  CC: Body Pain/RE   Primary Care Provider:  Dr. Jenny Reichmann  CC:  Body Pain/RE.  History of Present Illness: just had urology visit and PSA done in december 2010;  overall doing ok except level of pain control on current meds just not tenable;  requeswts pain clinic referral;  Pt denies CP, sob, doe, wheezing, orthopnea, pnd, worsening LE edema, palps, dizziness or syncope  Pt denies new neuro symptoms such as headache, facial or extremity weakness  No new complaints.  Has some very slight LE edema but no change.  Trying to follow lower chol diet.   Problems Prior to Update: 1)  Hyperlipidemia-mixed  (ICD-272.4) 2)  Otitis Externa, Left  (ICD-380.10) 3)  Coronary Artery Disease  (ICD-414.00) 4)  Hypertension  (ICD-401.9) 5)  Chest Pain  (ICD-786.50) 6)  Depression  (ICD-311) 7)  Fatigue  (ICD-780.79) 8)  Special Screening Malig Neoplasms Other Sites  (ICD-V76.49) 9)  Uri  (ICD-465.9) 10)  Ankle Pain, Right  (ICD-719.47) 11)  Shoulder Pain, Bilateral  (ICD-719.41) 12)  Urethral Stricture  (ICD-598.9) 13)  Preventive Health Care  (ICD-V70.0) 14)  Nephrolithiasis, Hx of  (ICD-V13.01) 15)  Allergic Rhinitis  (ICD-477.9) 16)  Degenerative Joint Disease  (ICD-715.90)  Medications Prior to Update: 1)  Isosorbide Mononitrate Cr 60 Mg Xr24h-Tab (Isosorbide Mononitrate) .... One and One-Half  Tablet Daily 2)  Gabapentin 800 Mg Tabs (Gabapentin) .[name].. 1 By Mouth Three Times A Day 3)  Atenolol 25 Mg Tabs (Atenolol) .[name].. 1po Once Daily 4)  Oxycodone Hcl 15 Mg Tabs (Oxycodone Hcl) .[name].. 1 By Mouth Four Times Per Day - To Fill November 17, 2009 5)  Cymbalta 60 Mg Cpep (Duloxetine Hcl) .[name].. 1 By Mouth Once Daily 6)  Mirtazapine 30 Mg Tabs (Mirtazapine) .[name].. 1 By Mouth At Bedtime 7)  Effient 10 Mg Tabs (Prasugrel Hcl) .... Take Three Tablets The First Day Then One Tablet Daily 8)  Benazepril Hcl 20 Mg Tabs (Benazepril Hcl) .[name].. 1po Once Daily 9)  Skelaxin 800 Mg Tabs (Metaxalone) .[name].. 1 By Mouth Four Times Per Day 10)  Meloxicam 15 Mg Tabs (Meloxicam) .[name].. 1po Two Times A Day 11)  Crestor 10 Mg Tabs (Rosuvastatin Calcium) .[name].. 1po Once Daily 12)  Amlodipine Besylate 5 Mg Tabs (Amlodipine Besylate) .[name].. 1p O Once Daily 13)  Zolpidem Tartrate 10 Mg Tabs (Zolpidem Tartrate) .... 2 By Mouth At Bedtime As Needed 14)  Nitrofurantoin Macrocrystal 50 Mg Caps (Nitrofurantoin Macrocrystal) .[name].. 1 By Mouth Once Daily 15)  Nasonex 50 Mcg/act Susp (Mometasone Furoate) .... 2 Spray/side Per Day 16)  Lidoderm 5 % Ptch (Lidocaine) .[name].. 1 Patch Three Times A Day As Needed Pain 17)  Voltaren 1 % Gel (Diclofenac Sodium) .... Use Asd Three Times A Day 18)  Nitrostat 0.3 Mg Subl (Nitroglycerin) .... Use Asd As Needed 19)  Aspir-Low 81 Mg Tbec (Aspirin) .... One Tablet Daily  Current Medications (verified):  1)  Isosorbide Mononitrate Cr 60 Mg Xr24h-Tab (Isosorbide Mononitrate) .... One and One-Half  Tablet Daily 2)  Gabapentin 800 Mg Tabs (Gabapentin) .[name].. 1 By Mouth Three Times A Day 3)  Atenolol 25 Mg Tabs (Atenolol) .[name].. 1po Once Daily 4)  Oxycodone Hcl 15 Mg Tabs (Oxycodone Hcl) .[name].. 1 By Mouth Four Times Per Day - To Fill Sept 27, 2011 5)  Cymbalta 60 Mg Cpep (Duloxetine Hcl) .[name].. 1 By Mouth Once Daily 6)  Mirtazapine 30 Mg Tabs (Mirtazapine) .[name].. 1 By Mouth At Bedtime 7)  Effient 10 Mg Tabs (Prasugrel Hcl) .... Take Three Tablets The First Day Then One Tablet Daily 8)  Benazepril Hcl 20 Mg Tabs (Benazepril Hcl) .[name].. 1po Once Daily 9)  Skelaxin 800 Mg Tabs (Metaxalone) .[name].. 1 By Mouth Four Times Per Day 10)  Meloxicam 15 Mg Tabs (Meloxicam)  .[name].. 1po Two Times A Day 11)  Crestor 10 Mg Tabs (Rosuvastatin Calcium) .[name].. 1po Once Daily 12)  Amlodipine Besylate 5 Mg Tabs (Amlodipine Besylate) .[name].. 1p O Once Daily 13)  Zolpidem Tartrate 10 Mg Tabs (Zolpidem Tartrate) .... 2 By Mouth At Bedtime As Needed 14)  Nitrofurantoin Macrocrystal 50 Mg Caps (Nitrofurantoin Macrocrystal) .[name].. 1 By Mouth Once Daily 15)  Nasonex 50 Mcg/act Susp (Mometasone Furoate) .... 2 Spray/side Per Day 16)  Lidoderm 5 % Ptch (Lidocaine) .[name].. 1 Patch Three Times A Day As Needed Pain 17)  Voltaren 1 % Gel (Diclofenac Sodium) .... Use Asd Three Times A Day 18)  Nitrostat 0.3 Mg Subl (Nitroglycerin) .... Use Asd As Needed 19)  Aspir-Low 81 Mg Tbec (Aspirin) .... One Tablet Daily  Allergies (verified): No Known Drug Allergies  Past History:  Past Medical History: Last updated: 08/10/2009 1. CORONARY ARTERY DISEASE (ICD-414.00): Has had multiple PCIs, reports he never had an MI (just angina).  12/08 had Taxus DES to mLAD, Endeavor DES to [facility] and to dCFX.  11/09 had BMS x 2 to mid RCA.  1/09 had Xience DES to dCFX, mCFX, and pCFX.  Lexiscan myoview (3/11): EF 50%, apical hypokinesis, possible small inferoapical infarct with no ischemia.  Low risk.  2.  HYPERTENSION (ICD-401.9) 3. DEPRESSION (ICD-311) 4. URETHRAL STRICTURE (ICD-598.9) 5. NEPHROLITHIASIS, HX OF (ICD-V13.01) 6. ALLERGIC RHINITIS (ICD-477.9) 7. DEGENERATIVE JOINT DISEASE (ICD-715.90): s/p bilateral TKRs 8. OSA: Not using CPAP very often.  9. Hyperlipidemia 10. Obesity 11. Echo (3/11): EF 55-60%, normal wall motion, grade I diastolic dysfunction, normal valves, normal RV.  12. Clopidogrel resistant  Past Surgical History: Last updated: 09/17/2009 Tonsillectomy Rotator cuff repair - bilat s/p knee replacement 2008 s/p bilat wrist surgury s/p urethral dilations , mult in 2009, 2010 Carpal tunnel release s/p right knee arthroscopy 03/2005 and mult prior s/p umbilical hernia s/p left knee  arthroscopy june 2002 and mult prior s/p right shoulder rotater cuff surgury apr 2011 - Dr [name] Lighter  Social History: Last updated: 07/15/2009 Divorced, moved here from [place].  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   Risk Factors: Smoking Status: quit (06/24/2009)  Social History: Reviewed history from 07/15/2009 and no changes required. Divorced, moved here from [place].  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  Review of Systems       all otherwise negative per pt -    Physical Exam  General:  alert and overweight-appearing.   Head:  normocephalic and atraumatic.   Eyes:  vision grossly intact, pupils equal, and pupils round.   Ears:  R ear normal and L ear normal.   Nose:  no external deformity and no nasal discharge.   Mouth:  no gingival abnormalities and pharynx pink and moist.   Neck:  supple and no masses.   Lungs:  normal respiratory effort and normal breath sounds.   Heart:  normal rate and regular rhythm.   Extremities:  no edema, no erythema    Impression & Recommendations:  Problem # 1:  CHRONIC PAIN SYNDROME (ICD-338.4)  on mult meds; to cont same meds for now, but will need referral to pain clinic  Orders: Pain Clinic Referral (Pain)  Problem # 2:  HYPERTENSION (ICD-401.9)  His updated medication list for this problem includes:    Atenolol 25 Mg Tabs (Atenolol) .[name]... 1po once daily    Benazepril Hcl 20 Mg Tabs (Benazepril hcl) .[name]... 1po once daily    Amlodipine Besylate 5 Mg Tabs (Amlodipine besylate) .[name]... 1p o once daily  BP today: 132/80 Prior BP: 130/74 (09/17/2009)  Labs Reviewed: K+: 4.9 (07/15/2009) Creat: : 1.1  (07/15/2009)   Chol: 98 (07/15/2009)   HDL: 41.70 (07/15/2009)   LDL: 45 (07/15/2009)   TG: 59.0 (07/15/2009) stable overall by hx and exam, ok to continue meds/tx as is   Problem # 3:  [id number] (ICD-272.4)  His updated medication list for this problem includes:    Crestor 10 Mg Tabs (Rosuvastatin calcium) .[name]... 1po once daily  Labs Reviewed: SGOT: 27 (07/15/2009)   SGPT: 23 (07/15/2009)   HDL:41.70 (07/15/2009)  LDL:45 (07/15/2009)  Chol:98 (07/15/2009)  Trig:59.0 (07/15/2009) stable overall by hx and exam, ok to continue meds/tx as is   Complete Medication List: 1)  Isosorbide Mononitrate Cr 60 Mg Xr24h-tab (Isosorbide mononitrate) .... One and one-half  tablet daily 2)  Gabapentin 800 Mg Tabs (Gabapentin) .[name].. 1 by mouth three times a day 3)  Atenolol 25 Mg Tabs (Atenolol) .[name].. 1po once daily 4)  Oxycodone Hcl 15 Mg Tabs (Oxycodone hcl) .[name].. 1 by mouth four times per day - to fill sept 27, 2011 5)  Cymbalta 60 Mg Cpep (Duloxetine hcl) .[name].. 1 by mouth once daily 6)  Mirtazapine 30 Mg Tabs (Mirtazapine) .[name].. 1 by mouth at bedtime 7)  Effient 10 Mg Tabs (Prasugrel hcl) .... Take three tablets the first day then one tablet daily 8)  Benazepril Hcl 20 Mg Tabs (Benazepril hcl) .[name].. 1po once daily 9)  Skelaxin 800 Mg Tabs (Metaxalone) .[name].. 1 by mouth four times per day 10)  Meloxicam 15 Mg Tabs (Meloxicam) .[name].. 1po two times a day 11)  Crestor 10 Mg Tabs (Rosuvastatin calcium) .[name].. 1po once daily 12)  Amlodipine Besylate 5 Mg Tabs (Amlodipine besylate) .[name].. 1p o once daily 13)  Zolpidem Tartrate 10 Mg Tabs (Zolpidem tartrate) .... 2 by mouth at bedtime as needed 14)  Nitrofurantoin Macrocrystal 50 Mg Caps (Nitrofurantoin macrocrystal) .[name].. 1 by mouth once daily 15)  Nasonex 50 Mcg/act Susp (Mometasone furoate) .... 2 spray/side per day 16)  Lidoderm 5 % Ptch (Lidocaine) .[name].. 1 patch three times a day as needed pain 17)  Voltaren 1 % Gel (Diclofenac sodium) .... Use asd three  times a day 18)  Nitrostat 0.3 Mg Subl (Nitroglycerin) .... Use asd as  needed 19)  Aspir-low 81 Mg Tbec (Aspirin) .... One tablet daily  Patient Instructions: 1)  Continue all previous medications as before this visit 2)  You will be contacted about the referral(s) to: Pain clinic 3)  Please schedule a follow-up appointment in Jan 2012 for "yearly medicare exam" Prescriptions: SKELAXIN 800 MG TABS (METAXALONE) 1 by mouth four times per day  #120 x 5   Entered and Authorized by:   [name] MD   Signed by:   [name] MD on 10/27/2009   Method used:   Print then Give to Patient   RxID:   [id number] LIDODERM 5 % PTCH (LIDOCAINE) 1 patch three times a day as needed pain  #90 x 5   Entered and Authorized by:   [name] MD   Signed by:   [name] MD on 10/27/2009   Method used:   Print then Give to Patient   RxID:   [id number] GABAPENTIN 800 MG TABS (GABAPENTIN) 1 by mouth three times a day  #270 x 1   Entered and Authorized by:   [name] MD   Signed by:   [name] MD on 10/27/2009   Method used:   Print then Give to Patient   RxID:   [id number] OXYCODONE HCL 15 MG TABS (OXYCODONE HCL) 1 by mouth four times per day - to fill sept 27, 2011  #120 x 0   Entered and Authorized by:   [name] MD   Signed by:   [name] MD on 10/27/2009   Method used:   Print then Give to Patient   RxID:   [id number] OXYCODONE HCL 15 MG TABS (OXYCODONE HCL) 1 by mouth four times per day - to fill Jan 16, 2010  #120 x 0   Entered and Authorized by:   [name] MD   Signed by:   [name] MD on 10/27/2009   Method used:   Print then Give to Patient   RxID:   [id number] OXYCODONE HCL 15 MG TABS (OXYCODONE HCL) 1 by mouth four times per day - to fill December 17, 2009  #120 x 0   Entered and Authorized by:   [name] MD   Signed by:   [name] MD on 10/27/2009   Method used:   Print then Give to Patient   RxID:   [id number]

## 2010-06-23 NOTE — Letter (Signed)
Summary: [facility]  [facility]   Imported By: Bubba Hales 05/06/2010 10:31:16  _____________________________________________________________________  External Attachment:    Type:   Image     Comment:   External Document

## 2010-06-23 NOTE — Progress Notes (Signed)
Summary: Weight loss?  Phone Note Call from Patient Call back at [facility] Phone [phone]   Caller: Patient Summary of Call: Pt called asking if MD can refer or suggest a weight loss program. Pt also asked about status of pain clinic referral, he says he was told that referral will take 2-3 days.  Initial call taken by: Crissie Sickles, CMA,  November 11, 2009 1:20 PM  Follow-up for Phone Call        the only wt loss program that really works it [employer]  - I would highly encourage that  sorry, I may have mis-spoke regarding the referral -  the pain clinic referrals normally take sometimes up to 2 to 3 months (not days) Follow-up by: [name] MD,  November 11, 2009 5:38 PM  Additional Follow-up for Phone Call Additional follow up Details #1::        pt informed Additional Follow-up by: Crissie Sickles, [place],  November 12, 2009 8:14 AM

## 2010-06-23 NOTE — Assessment & Plan Note (Signed)
Summary: 3 mos f/u #/ cd   Vital Signs:  Patient profile:   60 year old male Height:      72 inches Weight:      365.50 pounds BMI:     49.75 O2 Sat:      96 % on Room air Temp:     98.6 degrees F oral Pulse rate:   57 / minute BP sitting:   128 / 72  (left arm) Cuff size:   large  Vitals Entered By: Shirlean Mylar Ewing CMA (AAMA) (December 17, 2009 1:21 PM)  O2 Flow:  Room air  CC: 3 month followup/RE   Primary Care Provider:  Dr. Jenny Reichmann  CC:  3 month followup/RE.  History of Present Illness: due to establish with pain clinic aug 11l  ;  overall o/w doing well, no specific complaints, getting PT with neck traction and liekly to get home unit as well to do his own traction at home as this has really helped;  just saw Dr Aundra Dubin /card - imdur stopped,  viagra ok;  Pt denies CP, sob, doe, wheezing, orthopnea, pnd, worsening LE edema, palps, dizziness or syncope  Pt denies new neuro symptoms such as headache, facial or extremity weakness  No hx of dm, denies polydipsia, polyuria.  Does not use his CPAP nightly though.  No fever, wt loss, night sweats, loss of appetite or other constitutional symptoms  No worsening depressive symtpoms, suidical ideation or panic.  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  Problems Prior to Update: 1)  Chronic Pain Syndrome  (ICD-338.4) 2)  Hyperlipidemia-mixed  (ICD-272.4) 3)  Otitis Externa, Left  (ICD-380.10) 4)  Coronary Artery Disease  (ICD-414.00) 5)  Hypertension  (ICD-401.9) 6)  Chest Pain  (ICD-786.50) 7)  Depression  (ICD-311) 8)  Fatigue  (ICD-780.79) 9)  Special Screening Malig Neoplasms Other Sites  (ICD-V76.49) 10)  Uri  (ICD-465.9) 11)  Ankle Pain, Right  (ICD-719.47) 12)  Shoulder Pain, Bilateral  (ICD-719.41) 13)  Urethral Stricture  (ICD-598.9) 14)  Preventive Health Care  (ICD-V70.0) 15)  Nephrolithiasis, Hx of  (ICD-V13.01) 16)  Allergic Rhinitis  (ICD-477.9) 17)  Degenerative Joint Disease  (ICD-715.90)  Medications  Prior to Update: 1)  Gabapentin 800 Mg Tabs (Gabapentin) .[name].. 1 By Mouth Three Times A Day 2)  Atenolol 25 Mg Tabs (Atenolol) .[name].. 1po Once Daily 3)  Oxycodone Hcl 15 Mg Tabs (Oxycodone Hcl) .[name].. 1 By Mouth Four Times Per Day - To Fill Sept 27, 2011 4)  Cymbalta 60 Mg Cpep (Duloxetine Hcl) .[name].. 1 By Mouth Once Daily 5)  Mirtazapine 30 Mg Tabs (Mirtazapine) .[name].. 1 By Mouth At Bedtime 6)  Effient 10 Mg Tabs (Prasugrel Hcl) .... Take Three Tablets The First Day Then One Tablet Daily 7)  Benazepril Hcl 20 Mg Tabs (Benazepril Hcl) .[name].. 1po Once Daily 8)  Skelaxin 800 Mg Tabs (Metaxalone) .[name].. 1 By Mouth Four Times Per Day 9)  Crestor 10 Mg Tabs (Rosuvastatin Calcium) .[name].. 1po Once Daily 10)  Amlodipine Besylate 5 Mg Tabs (Amlodipine Besylate) .[name].. 1p O Once Daily 11)  Zolpidem Tartrate 10 Mg Tabs (Zolpidem Tartrate) .... 2 By Mouth At Bedtime As Needed 12)  Nitrofurantoin Macrocrystal 50 Mg Caps (Nitrofurantoin Macrocrystal) .[name].. 1 By Mouth Once Daily 13)  Nasonex 50 Mcg/act Susp (Mometasone Furoate) .... 2 Spray/side Per Day 14)  Lidoderm 5 % Ptch (Lidocaine) .[name].. 1 Patch Three Times A Day As Needed Pain 15)  Voltaren 1 % Gel (  Diclofenac Sodium) .... Use Asd Three Times A Day 16)  Nitrostat 0.3 Mg Subl (Nitroglycerin) .... Use Asd As Needed 17)  Aspir-Low 81 Mg Tbec (Aspirin) .... One Tablet Daily 18)  Indomethacin 25 Mg Caps (Indomethacin) .... 2 By Mouth Daily 19)  Viagra 25 Mg Tabs (Sildenafil Citrate) .... Use As Directed As Needed  Current Medications (verified): 1)  Gabapentin 800 Mg Tabs (Gabapentin) .[name].. 1 By Mouth Three Times A Day 2)  Atenolol 25 Mg Tabs (Atenolol) .[name].. 1po Once Daily 3)  Oxycodone Hcl 15 Mg Tabs (Oxycodone Hcl) .[name].. 1 By Mouth Four Times Per Day - To Fill Sept 27, 2011 4)  Cymbalta 60 Mg Cpep (Duloxetine Hcl) .[name].. 1 By Mouth Once Daily 5)  Mirtazapine 30 Mg Tabs (Mirtazapine) .[name].. 1 By Mouth At Bedtime 6)  Effient 10 Mg Tabs (Prasugrel Hcl) .... Take Three Tablets  The First Day Then One Tablet Daily 7)  Benazepril Hcl 20 Mg Tabs (Benazepril Hcl) .[name].. 1po Once Daily 8)  Skelaxin 800 Mg Tabs (Metaxalone) .[name].. 1 By Mouth Four Times Per Day 9)  Crestor 10 Mg Tabs (Rosuvastatin Calcium) .[name].. 1po Once Daily 10)  Amlodipine Besylate 5 Mg Tabs (Amlodipine Besylate) .[name].. 1p O Once Daily 11)  Zolpidem Tartrate 10 Mg Tabs (Zolpidem Tartrate) .... 2 By Mouth At Bedtime As Needed 12)  Nitrofurantoin Macrocrystal 50 Mg Caps (Nitrofurantoin Macrocrystal) .[name].. 1 By Mouth Once Daily 13)  Nasonex 50 Mcg/act Susp (Mometasone Furoate) .... 2 Spray/side Per Day 14)  Lidoderm 5 % Ptch (Lidocaine) .[name].. 1 Patch Three Times A Day As Needed Pain 15)  Voltaren 1 % Gel (Diclofenac Sodium) .... Use Asd Three Times A Day 16)  Nitrostat 0.3 Mg Subl (Nitroglycerin) .... Use Asd As Needed 17)  Aspir-Low 81 Mg Tbec (Aspirin) .... One Tablet Daily 18)  Indomethacin 25 Mg Caps (Indomethacin) .... 2 By Mouth Daily 19)  Viagra 25 Mg Tabs (Sildenafil Citrate) .... Use As Directed As Needed  Allergies (verified): No Known Drug Allergies  Past History:  Past Surgical History: Last updated: 09/17/2009 Tonsillectomy Rotator cuff repair - bilat s/p knee replacement 2008 s/p bilat wrist surgury s/p urethral dilations , mult in 2009, 2010 Carpal tunnel release s/p right knee arthroscopy 03/2005 and mult prior s/p umbilical hernia s/p left knee arthroscopy june 2002 and mult prior s/p right shoulder rotater cuff surgury apr 2011 - Dr [name] Lighter  Social History: Last updated: 07/15/2009 Divorced, moved here from [place].  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   Risk Factors: Smoking Status: quit (06/24/2009)  Past Medical History: 1. CORONARY ARTERY DISEASE (ICD-414.00): Has had multiple PCIs, reports he never had an MI  (just angina).  12/08 had Taxus DES to mLAD, Endeavor DES to [facility] and to dCFX.  11/09 had BMS x 2 to mid RCA.  1/09 had Xience DES to dCFX, mCFX, and pCFX.  Lexiscan myoview (3/11): EF 50%, apical hypokinesis, possible small inferoapical infarct with no ischemia.  Low risk.  2.  HYPERTENSION (ICD-401.9) 3. DEPRESSION (ICD-311) 4. URETHRAL STRICTURE (ICD-598.9) 5. NEPHROLITHIASIS, HX OF (ICD-V13.01) 6. ALLERGIC RHINITIS (ICD-477.9) 7. DEGENERATIVE JOINT DISEASE (ICD-715.90): s/p bilateral TKRs 8. OSA: Not using CPAP very often.  9. Hyperlipidemia 10. Obesity 11. Echo (3/11): EF 55-60%, normal wall motion, grade I diastolic dysfunction, normal valves, normal RV.  12. Clopidogrel  resistant 13. Rotator cuff repair 4/11  Review of Systems       all otherwise negative per pt -    Physical Exam  General:  alert and overweight-appearing.   Head:  normocephalic and atraumatic.   Eyes:  vision grossly intact, pupils equal, and pupils round.   Ears:  R ear normal and L ear normal.   Nose:  no external deformity and no nasal discharge.   Mouth:  no gingival abnormalities and pharynx pink and moist.   Neck:  supple and no masses.   Lungs:  normal respiratory effort and normal breath sounds.   Heart:  normal rate and regular rhythm.   Abdomen:  soft, non-tender, and normal bowel sounds.   Msk:  no joint tenderness and no joint swelling.   Extremities:  trace edema bilat   Impression & Recommendations:  Problem # 1:  CHRONIC PAIN SYNDROME (ICD-338.4) stable overall by hx and exam, ok to continue meds/tx as is, f/u pain clinic as planned  Problem # 2:  HYPERTENSION (ICD-401.9)  His updated medication list for this problem includes:    Atenolol 25 Mg Tabs (Atenolol) .[name]... 1po once daily    Benazepril Hcl 20 Mg Tabs (Benazepril hcl) .[name]... 1po once daily    Amlodipine Besylate 5 Mg Tabs (Amlodipine besylate) .[name]... 1p o once daily  BP today: 128/72 Prior BP: 122/74 (12/14/2009)  Labs  Reviewed: K+: 4.9 (07/15/2009) Creat: : 1.1 (07/15/2009)   Chol: 98 (07/15/2009)   HDL: 41.70 (07/15/2009)   LDL: 45 (07/15/2009)   TG: 59.0 (07/15/2009) stable overall by hx and exam, ok to continue meds/tx as is   Problem # 3:  DEPRESSION (ICD-311)  His updated medication list for this problem includes:    Cymbalta 60 Mg Cpep (Duloxetine hcl) .[name]... 1 by mouth once daily    Mirtazapine 30 Mg Tabs (Mirtazapine) .[name]... 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 Referral (Misc. Ref)  Complete Medication List: 1)  Gabapentin 800 Mg Tabs (Gabapentin) .[name].. 1 by mouth three times a day 2)  Atenolol 25 Mg Tabs (Atenolol) .[name].. 1po once daily 3)  Oxycodone Hcl 15 Mg Tabs (Oxycodone hcl) .[name].. 1 by mouth four times per day - to fill sept 27, 2011 4)  Cymbalta 60 Mg Cpep (Duloxetine hcl) .[name].. 1 by mouth once daily 5)  Mirtazapine 30 Mg Tabs (Mirtazapine) .[name].. 1 by mouth at bedtime 6)  Effient 10 Mg Tabs (Prasugrel hcl) .... Take three tablets the first day then one tablet daily 7)  Benazepril Hcl 20 Mg Tabs (Benazepril hcl) .[name].. 1po once daily 8)  Skelaxin 800 Mg Tabs (Metaxalone) .[name].. 1 by mouth four times per day 9)  Crestor 10 Mg Tabs (Rosuvastatin calcium) .[name].. 1po once daily 10)  Amlodipine Besylate 5 Mg Tabs (Amlodipine besylate) .[name].. 1p o once daily 11)  Zolpidem Tartrate 10 Mg Tabs (Zolpidem tartrate) .... 2 by mouth at bedtime as needed 12)  Nitrofurantoin Macrocrystal 50 Mg Caps (Nitrofurantoin macrocrystal) .[name].. 1 by mouth once daily 13)  Nasonex 50 Mcg/act Susp (Mometasone furoate) .... 2 spray/side per day 14)  Lidoderm 5 % Ptch (Lidocaine) .[name].. 1 patch three times a day as needed pain 15)  Voltaren 1 %  Gel (Diclofenac sodium) .... Use asd three  times a day 16)  Nitrostat 0.3 Mg Subl (Nitroglycerin) .... Use asd as needed 17)  Aspir-low 81 Mg Tbec (Aspirin) .... One tablet daily 18)  Indomethacin 25 Mg Caps (Indomethacin) .... 2 by mouth daily 19)  Viagra 25 Mg Tabs (Sildenafil citrate) .... Use as directed as needed  Patient Instructions: 1)  Continue all previous medications as before this visit  2)  Your form was signed today 3)  You will be contacted about the referral(s) to: oral surgeon 4)  Please schedule a follow-up appointment in march 2012 (or sooner if needed) for your "yearly medicare exam"

## 2010-06-23 NOTE — Progress Notes (Signed)
Summary: CALL  Phone Note Call from Patient Call back at [facility] Phone [phone]   Summary of Call: Pt left vm req a call back about his hearing.  Initial call taken by: Charlsie Quest, [place],  June 15, 2010 3:18 PM  Follow-up for Phone Call        Pt is requesting referral to ENT for decreased hearing, please see OV with Dr Ronnald Ramp Follow-up by: Crissie Sickles, [place],  June 16, 2010 1:21 PM  Additional Follow-up for Phone Call Additional follow up Details #1::        ok for referral - done per emr Additional Follow-up by: [name] MD,  June 16, 2010 1:23 PM    Additional Follow-up for Phone Call Additional follow up Details #2::    Pt advised to expect a call from [facility] with appt info. Follow-up by: Crissie Sickles, [place],  June 16, 2010 1:43 PM

## 2010-06-23 NOTE — Progress Notes (Signed)
Summary: returned call  Phone Note Call from Patient   Caller: Patient Reason for Call: Talk to Nurse Summary of Call: Returned call. Initial call taken by: Alexander Bergeron,  August 18, 2009 12:19 PM

## 2010-06-23 NOTE — Assessment & Plan Note (Signed)
Summary: Travis [name]  Primary Provider:  Dr. JJenny Reichmann  History of Present Illness: 60yo with history of CAD s/p multiple PCIs and obesity presents for followup.  He had some exertional chest pain [place] this [place]  This was a low risk study showing a probable small inferoapical infarct with no ischemia.  EF was preserved by echo and myoview.  He has been doing well.  No further exertional chest pressure.  He has been going to the pool almost daily this summer and doing water aerobics and walking in the pool.  He had a rotator cuff repair in 4/11 with no problems.  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   ECG: NSR at 57, normal  Current Medications (verified): 1)  Isosorbide Mononitrate Cr 60 Mg Xr24h-Tab (Isosorbide Mononitrate) .... One and One-Half  Tablet Daily 2)  Gabapentin 800 Mg Tabs (Gabapentin) ..[name]. 1 By Mouth Three Times A Day 3)  Atenolol 25 Mg Tabs (Atenolol) ..[name]. 1po Once Daily 4)  Oxycodone Hcl 15 Mg Tabs (Oxycodone Hcl) ..[name]. 1 By Mouth Four Times Per Day - To Fill Sept 27, 2011 5)  Cymbalta 60 Mg Cpep (Duloxetine Hcl) ..[name]. 1 By Mouth Once Daily 6)  Mirtazapine 30 Mg Tabs (Mirtazapine) ..[name]. 1 By Mouth At Bedtime 7)  Effient 10 Mg Tabs (Prasugrel Hcl) .... Take Three Tablets The First Day Then One Tablet Daily 8)  Benazepril Hcl 20 Mg Tabs (Benazepril Hcl) ..[name]. 1po Once Daily 9)  Skelaxin 800 Mg Tabs (Metaxalone) ..[name]. 1 By Mouth Four Times Per Day 10)  Crestor 10 Mg Tabs (Rosuvastatin Calcium) ..[name]. 1po Once Daily 11)  Amlodipine Besylate 5 Mg Tabs (Amlodipine Besylate) ..[name]. 1p O Once Daily 12)  Zolpidem Tartrate 10 Mg Tabs (Zolpidem Tartrate) .... 2 By Mouth  At Bedtime As Needed 13)  Nitrofurantoin Macrocrystal 50 Mg Caps (Nitrofurantoin Macrocrystal) ..[name]. 1 By Mouth Once Daily 14)  Nasonex 50 Mcg/act Susp (Mometasone Furoate) .... 2 Spray/side Per Day 15)  Lidoderm 5 % Ptch (Lidocaine) ..[name]. 1 Patch Three Times A Day As Needed Pain 16)  Voltaren 1 % Gel (Diclofenac Sodium) .... Use Asd Three Times A Day 17)  Nitrostat 0.3 Mg Subl (Nitroglycerin) .... Use Asd As Needed 18)  Aspir-Low 81 Mg Tbec (Aspirin) .... One Tablet Daily 19)  Indomethacin 25 Mg Caps (Indomethacin) .... 2 By Mouth Daily  Allergies (verified): No Known Drug Allergies  Past History:  Past Medical History: 1. CORONARY ARTERY DISEASE (ICD-414.00): Has had multiple PCIs, reports he never had an MI (just angina).  12[facility]08 [facility].  11/09 had BMS x 2 to mid RCA.  1/09 had Xience DES to dCFX, mCFX, and pCFX.  Lexiscan myoview (3/11): EF 50%, apical hypokinesis, possible small inferoapical infarct with no ischemia.  Low risk.  2.  HYPERTENSION (ICD-401.9) 3. DEPRESSION (ICD-311) 4. URETHRAL STRICTURE (ICD-598.9) 5. NEPHROLITHIASIS, HX OF (ICD-V13.01) 6. ALLERGIC RHINITIS (ICD-477.9) 7. DEGENERATIVE JOINT DISEASE (ICD-715.90): s/p bilateral TKRs 8. OSA: Not using CPAP very often.  9. Hyperlipidemia 10. Obesity 11. Echo (3/11): EF 55-60%, normal wall motion, grade I diastolic dysfunction,  normal valves, normal RV.  12. Clopidogrel resistant 13. Rotator cuff repair 4/11  Family History: Reviewed history from 06/28/2009 and no changes required. mother with met cancer, unknown primary, DJD, heart disease, depression, HTN, breast cancer father with DM, heart disease/CABG,  skin cancers, HTN, elev chol, and prostate cancer grandparent wih DJD, depression, CAD, HTN brother x 2 with HTN, depression  Social History: Reviewed history from 07/15/2009 and no changes required. Divorced, moved here from [place].  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   Review of Systems       All systems reviewed and negative except as per HPI.   Vital Signs:  Patient profile:   60 year old male Height:      72 inches Weight:      371 pounds BMI:     50.50 Pulse rate:   57 / minute Resp:     18 per minute BP sitting:   122 / 74  (left arm)  Vitals Entered By: Levora Angel, CNA (December 14, 2009 2:00 PM)  Physical Exam  General:  alert and overweight-appearing.   Neck:  Neck supple, no JVD. No masses, thyromegaly or abnormal cervical nodes. Lungs:  Clear bilaterally to auscultation and percussion. Heart:  Non-displaced PMI, chest non-tender; regular rate and rhythm, S1, S2 without murmurs, rubs or gallops. Carotid upstroke normal, no bruit. Pedals normal pulses. Trace ankle edema.  Abdomen:  Bowel sounds positive; abdomen soft and non-tender without masses, organomegaly, or hernias noted. No hepatosplenomegaly. Extremities:  No clubbing or cyanosis. Neurologic:  Alert and oriented x 3. Psych:  Normal affect.   Impression & Recommendations:  Problem # 1:  CORONARY ARTERY DISEASE (ICD-414.00) Patient has CAD s/p multiple PCIs.  He has the cards for a total of 8 stents, most recently in 11/09.  Given exertional chest pain earlier in the year, I did a Lexiscan myoview that was low risk with probable small inferoapical infarct and no ischemia.  EF preserved on echo and myoview.  No exertional chest pain.  Exercising more.  - Continue ASA, ACEI, atenolol, Effient - Patient wants to use Viagra.  I am leery about this but will have him stop Imdur completely.  I will give him a prescription for Viagra 25 mg as needed.  He understands that he cannot use sublingual nitroglycerine or Imdur with this medication.    Problem # 2:  [id number] (LHT-342.4) LDL at goal <  70 when last checked.   Problem # 3:  HYPERTENSION (ICD-401.9) BP under good control.   Problem # 4:  OBESITY Needs weight loss.   Patient Instructions: 1)  Your physician has recommended you make the following change in your medication:  2)  Stop Imdur(Isosorbide). 3)  Use Viagra. 4)  DO NOT USE NITROGLYCERIN WITHIN 24 HOURS OF USING VIAGRA. 5)  Your physician recommends that you return for a FASTING lipid profile/liver profile SEPTEMBER 2011--414.01 272.0 6)  Your physician wants you to follow-up in: 6 months with Dr Aundra Dubin.  You will receive a reminder letter in the mail two months in advance. If you don't receive a letter, please call our office to schedule the follow-up appointment. Prescriptions: VIAGRA 25 MG TABS (SILDENAFIL CITRATE) use as directed as needed  #30 x 2   Entered by:   Desiree [name], RN, BSN  Authorized by:   Loralie Champagne, MD   Signed by:   Desiree [name], RN, BSN on 12/14/2009   Method used:   Electronically to        [id number] (retail)       [place], [place]  [address]       Ph: [phone]       Fax: [phone]   RxID:   [id number]

## 2010-06-23 NOTE — Assessment & Plan Note (Signed)
Summary: NEW/ MEDICARE/ [facility] #   Vital Signs:  Patient profile:   60 year old male Height:      71 inches Weight:      374 pounds BMI:     52.35 O2 Sat:      97 % on Room air Temp:     99.2 degrees F oral Pulse rate:   68 / minute BP sitting:   130 / 80  (left arm) Cuff size:   large  Vitals Entered ByShirlean Mylar Ewing (June 24, 2009 9:46 AM)  O2 Flow:  Room air  Preventive Care Screening  Colonoscopy:    Date:  05/22/2008    Next Due:  05/2018    Results:  normal   Last Pneumovax:    Date:  02/19/2009    Results:  given   Last Tetanus Booster:    Date:  05/22/2004    Results:  Tdap      declines flu shot  CC: new pt, new medicare/RE   CC:  new pt and new medicare/RE.  History of Present Illness: here to establish, out of most of his meds since moved here  since jan7;  a few days ago had 2 episodes of chest discomfort, sharp, midsternal, rad to neck area and jaw,  assoc with sob and palps, but no nausea, and no syncope.  Relieved with 2 sl ntg, as is usual for him he states..  Needs to establish with new specialissts - cards, psych, urology, and ortho.  Denies increased depressive symptosm or suicidal ideation or panic.  No worsening GU or joint issue but would like ongoing care as before.  Pt denies new neuro symptoms such as headache, facial or extremity weakness   Overall good compliance with meds prior to running out;  tolerated well.  No ST or cough today. Here for wellness Diet: Heart Healthy or DM if diabetic Physical Activities: Sedentary Depression/mood screen: as above Hearing: Intact bilateral Visual Acuity: Grossly normal ADL's: Capable  Fall Risk: mild Home Safety: Good End-of-Life Planning: Advance directive - Full code/I agree   Preventive Screening-Counseling & Management  Alcohol-Tobacco     Smoking Status: quit      Drug Use:  no.    Problems Prior to Update: 1)  Special Screening Malig Neoplasms Other Sites  (ICD-V76.49) 2)  Fever   (ICD-780.60) 3)  Uri  (ICD-465.9) 4)  Fatigue  (ICD-780.79) 5)  Ankle Pain, Right  (ICD-719.47) 6)  Shoulder Pain, Bilateral  (ICD-719.41) 7)  Urethral Stricture  (ICD-598.9) 8)  Chest Pain  (ICD-786.50) 9)  Preventive Health Care  (ICD-V70.0) 10)  Coronary Artery Disease  (ICD-414.00) 11)  Nephrolithiasis, Hx of  (ICD-V13.01) 12)  Hypertension  (ICD-401.9) 13)  Allergic Rhinitis  (ICD-477.9) 14)  Depression  (ICD-311) 15)  Degenerative Joint Disease  (ICD-715.90)  Medications Prior to Update: 1)  None  Current Medications (verified): 1)  Isosorbide Mononitrate Cr 30 Mg Xr24h-Tab (Isosorbide Mononitrate) .[name].. 1 By Mouth Once Daily 2)  Gabapentin 800 Mg Tabs (Gabapentin) .[name].. 1 By Mouth Three Times A Day 3)  Atenolol 25 Mg Tabs (Atenolol) .[name].. 1po Once Daily 4)  Oxycodone Hcl 15 Mg Tabs (Oxycodone Hcl) .[name].. 1 By Mouth Four Times Per Day - To Fill Aug 19, 2009 5)  Cymbalta 60 Mg Cpep (Duloxetine Hcl) .[name].. 1 By Mouth Once Daily 6)  Mirtazapine 30 Mg Tabs (Mirtazapine) .[name].. 1 By Mouth At Bedtime 7)  Plavix 75 Mg Tabs (Clopidogrel Bisulfate) .[name].. 1po Once Daily  8)  Benazepril Hcl 20 Mg Tabs (Benazepril Hcl) .[name].. 1po Once Daily 9)  Skelaxin 800 Mg Tabs (Metaxalone) .[name].. 1 By Mouth Four Times Per Day 10)  Meloxicam 15 Mg Tabs (Meloxicam) .[name].. 1po Two Times A Day 11)  Crestor 10 Mg Tabs (Rosuvastatin Calcium) .[name].. 1po Once Daily 12)  Amlodipine Besylate 5 Mg Tabs (Amlodipine Besylate) .[name].. 1p O Once Daily 13)  Zolpidem Tartrate 10 Mg Tabs (Zolpidem Tartrate) .... 2 By Mouth At Bedtime As Needed 14)  Nitrofurantoin Macrocrystal 50 Mg Caps (Nitrofurantoin Macrocrystal) .[name].. 1 By Mouth Once Daily 15)  Nasonex 50 Mcg/act Susp (Mometasone Furoate) .... 2 Spray/side Per Day 16)  Lidoderm 5 % Ptch (Lidocaine) .[name].. 1 Patch Three Times A Day As Needed Pain 17)  Voltaren 1 % Gel (Diclofenac Sodium) .... Use Asd Three Times A Day 18)  Nitrostat 0.3 Mg Subl (Nitroglycerin) .... Use Asd As Needed 19)   Ecotrin 325 Mg Tbec (Aspirin) .[name].. 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  Risk Factors: Smoking Status: quit (06/24/2009)  Past Medical History: DJD - right foot/with chronic tendinopathy  Depression Allergic rhinitis Hypertension Nephrolithiasis, hx of Coronary artery disease right heel spur recurrent left iliotibial band tendonitis  Past Surgical History: Tonsillectomy Rotator cuff repair - bilat s/p knee replacement 2008 s/p bilat wrist surgury s/p urethral dilations , mult in 2009, 2010 Carpal tunnel release s/p right knee arthroscopy 03/2005 and mult prior s/p umbilical hernia s/p left knee arthroscopy june 2002 and mult prior  Family History: Reviewed history and no changes required. mother with met cancer, unknown primary, DJD, heart disease, depression, HTN, breast cancer father with DM, heart disease/CABG,  skin cancers, HTN, elev chol, and prostate cancer grandparent wih DJD, depression, CAD, HTN brother x 2 with HTN, depression  Social History: Reviewed history and no changes required. 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         all otherwise negative per pt - except mild ongoing fatigue, denies OSA symptoms  Physical Exam  General:  alert and overweight-appearing.   Head:  normocephalic and atraumatic.   Eyes:  vision grossly intact, pupils equal, and pupils round.   Ears:  mld bilat tm's erythema, canals ok, sinus nontendder Nose:  no external deformity and no nasal discharge.   Mouth:  pharyngeal erythema and fair dentition.   Neck:  supple and no masses.   Lungs:  normal respiratory effort and normal breath sounds.   Heart:  normal  rate and regular rhythm.   Abdomen:  soft, non-tender, and normal bowel sounds.   Msk:  no joint tenderness and no joint swelling.  except for significant right ankle bony change c/w djd, no effusion Extremities:  no erythema, no ulcers Neurologic:  cranial nerves II-XII intact and strength normal in all extremities.     Impression & Recommendations:  Problem # 1:  Preventive Health Care (ICD-V70.0)  Overall doing well, age appropriate education and counseling updated and referral for appropriate preventive services done unless declined, immunizations up to date or declined, diet counseling done if overweight, urged to quit smoking if smokes , most recent labs reviewed and current ordered if appropriate, ecg reviewed or declined (interpretation per ECG scanned in the EMR if done); information regarding Medicare Prevention requirements given if appropriate   Orders: First annual wellness visit with prevention plan  (Q6761)  Problem # 2:  CHEST PAIN (ICD-786.50)  c/w angina it seems but overall stable,  will  refill meds, refer cardiology  Orders: Cardiology Referral (Cardiology) EKG w/ Interpretation (93000) T-2 View CXR, Same Day (71020.5TC)  Problem # 3:  DEPRESSION (ICD-311)  refer psychiatry for f/u, o/w stable   Orders: Psychiatric Referral (Psych) Prescription Created Electronically [phone])  His updated medication list for this problem includes:    Cymbalta 60 Mg Cpep (Duloxetine hcl) .[name]... 1 by mouth once daily    Mirtazapine 30 Mg Tabs (Mirtazapine) .[name]... 1 by mouth at bedtime  Problem # 4:  URETHRAL STRICTURE (ICD-598.9)  due for urolgy f/u - will refer, stable overall by hx and exam, ok to continue meds/tx as is   Orders: Urology Referral (Urology)  Problem # 5:  SHOULDER PAIN, BILATERAL (ICD-719.41)  ok to refer to ortho  Orders: Orthopedic Surgeon Referral (Ortho Surgeon)  His updated medication list for this problem includes:    Oxycodone Hcl 15 Mg Tabs (Oxycodone hcl) .[name]... 1 by mouth four times per day - to fill Aug 19, 2009    Skelaxin 800 Mg Tabs (Metaxalone) .[name]... 1 by mouth four times per day    Meloxicam 15 Mg Tabs (Meloxicam) .[name]... 1po two times a day    Ecotrin 325 Mg Tbec (Aspirin) .[name]... 1po once daily  Problem # 6:  ANKLE PAIN, RIGHT (ICD-719.47)  ok to refer to dr bednarz/ortho  Orders: TLB-Uric Acid, Blood (84550-URIC) TLB-T4 (Thyrox), Free [phone]) Orthopedic Surgeon Referral (Ortho Surgeon)  Problem # 7:  FATIGUE (ICD-780.79) exam benign, to check labs below; follow with expectant management  Orders: TLB-BMP (Basic Metabolic Panel-BMET) (99833-ASNKNLZ) TLB-CBC Platelet - w/Differential (85025-CBCD) TLB-Hepatic/Liver Function Pnl (80076-HEPATIC) TLB-TSH (Thyroid Stimulating Hormone) (84443-TSH) TLB-Sedimentation Rate (ESR) (85652-ESR) TLB-IBC Pnl (Iron/FE;Transferrin) (83550-IBC) TLB-B12 + Folate Pnl (82746_82607-B12/FOL) T-Vitamin D (25-Hydroxy) (76734-19379)  Problem # 8:  FEVER (ICD-780.60)  mild low grade, likely due to URI  today - but will check urine cxr given hx as well   Orders: TLB-Udip w/ Micro (81001-URINE) T-Culture, Urine (02409-73532)  Complete Medication List: 1)  Isosorbide Mononitrate Cr 30 Mg Xr24h-tab (Isosorbide mononitrate) .[name].. 1 by mouth once daily 2)  Gabapentin 800 Mg Tabs (Gabapentin) .[name].. 1 by mouth three times a day 3)  Atenolol 25 Mg Tabs (Atenolol) .[name].. 1po once daily 4)  Oxycodone Hcl 15 Mg Tabs (Oxycodone hcl) .[name].. 1 by mouth four times per day - to fill Aug 19, 2009 5)  Cymbalta 60 Mg Cpep (Duloxetine hcl) .[name].. 1 by mouth once daily 6)  Mirtazapine 30 Mg Tabs (Mirtazapine) .[name].. 1 by mouth at bedtime  7)  Plavix 75 Mg Tabs (Clopidogrel bisulfate) .[name].. 1po once daily 8)  Benazepril Hcl 20 Mg Tabs (Benazepril hcl) .[name].. 1po once daily 9)  Skelaxin 800 Mg Tabs (Metaxalone) .[name].. 1 by mouth four times per day 10)  Meloxicam 15 Mg Tabs (Meloxicam) .[name].. 1po two times a day 11)  Crestor 10 Mg Tabs (Rosuvastatin calcium) .[name].. 1po once daily 12)  Amlodipine Besylate 5 Mg Tabs (Amlodipine besylate) .[name].. 1p o once daily 13)  Zolpidem Tartrate 10 Mg Tabs (Zolpidem tartrate) .... 2 by mouth at bedtime as needed 14)  Nitrofurantoin Macrocrystal 50 Mg Caps (Nitrofurantoin macrocrystal) .[name].. 1 by mouth once daily 15)  Nasonex 50 Mcg/act Susp (Mometasone furoate) .... 2 spray/side per day 16)  Lidoderm 5 % Ptch (Lidocaine) .[name].. 1 patch three times a day as needed pain 17)  Voltaren 1 % Gel (Diclofenac sodium) .... Use asd three times a day 18)  Nitrostat 0.3 Mg Subl (Nitroglycerin) .... Use asd as needed 19)  Ecotrin 325 Mg Tbec (Aspirin) .[name].. 1po once daily  Other Orders: TLB-PSA (Prostate Specific Antigen) (84153-PSA) TLB-Lipid Panel (80061-LIPID)  Patient Instructions: 1)  You will be contacted about the referral(s) to: cardiology, and psychiatry, urology, orthopedic for the shoulders, and Dr Jeannett Senior for the foot and ankle 2)  re-start your medications as before 3)  Please go to the  Lab in the basement for your blood and/or urine tests today  4)  Please go to Radiology in the basement level for your X-Ray today 5)  Your EKG was [facility] today  6)  please stop at the front desk to ask for the Medical Information Release Form to sign 7)  Please schedule a follow-up appointment in 3 months or sooner if needed Prescriptions: ATENOLOL 25 MG TABS (ATENOLOL) 1po once daily  #90 x 3   Entered and Authorized by:   [name] MD   Signed by:   [name] MD on 06/24/2009   Method used:   Electronically to        [facility]. #[id number]* (retail)       [place], [place]  [address]       Ph: [phone]       Fax: [phone]   RxID:   [id number] NITROSTAT 0.3 MG SUBL (NITROGLYCERIN) use asd as needed  #1bottle x 5   Entered and Authorized by:   [name] MD   Signed by:   [name] MD on 06/24/2009   Method used:   Electronically to        [facility]. #[id number]* (retail)       [place], [place]  [address]       Ph: [phone]       Fax: [phone]   RxID:   [id number] VOLTAREN 1 % GEL (DICLOFENAC SODIUM) use asd three times a day  #81mox 11   Entered and Authorized by:   JBiagio BorgMD   Signed by:   JBiagio BorgMD on 06/24/2009   Method used:   Electronically to        [facility] ##[id number] (retail)       [address] [place]  [address]      Ph: [phone]      Fax: [phone]  RxID:  [id number] LIDODERM 5 % PTCH (LIDOCAINE) 1 patch three times a day as needed pain  #90 x 5   Entered and Authorized by:   [name] MD   Signed by:   [name] MD on 06/24/2009   Method used:   Print then Give to Patient   RxID:   [id number] NASONEX 50 MCG/ACT SUSP (MOMETASONE FUROATE) 2 spray/side per day  #3inh x 3   Entered and Authorized by:   [name] MD   Signed by:   [name] MD on 06/24/2009   Method used:   Electronically to        [facility]. #[id number]* (retail)       [place], [place]  [address]       Ph: [phone]       Fax: [phone]   RxID:   [id number] NITROFURANTOIN MACROCRYSTAL 50 MG CAPS (NITROFURANTOIN MACROCRYSTAL) 1 by mouth once daily  #90 x 3   Entered and Authorized by:   [name] MD   Signed by:   [name] MD on 06/24/2009   Method used:   Electronically to        [facility]. #[id number]* (retail)       [place], [place]  [address]       Ph: [phone]       Fax: [phone]   RxID:   [id number] ZOLPIDEM TARTRATE 10 MG TABS (ZOLPIDEM TARTRATE) 2 by mouth at bedtime as needed  #180 x 1   Entered and Authorized by:   [name] MD   Signed by:   [name] MD on 06/24/2009   Method used:   Print then Give to Patient   RxID:   [id number] AMLODIPINE BESYLATE 5 MG TABS (AMLODIPINE BESYLATE) 1p o once daily  #90 x 3   Entered and Authorized by:   [name] MD   Signed by:   [name] MD on 06/24/2009   Method used:   Electronically to        [facility]. #[id number]* (retail)       [place], [place]  [address]       Ph: [phone]       Fax: [phone]   RxID:   [id number] CRESTOR 10 MG TABS (ROSUVASTATIN CALCIUM) 1po once daily  #90 x 3   Entered and Authorized by:   [name] MD   Signed by:   [name] MD on 06/24/2009   Method used:   Electronically to        [facility]. #[id number]* (retail)       [place], [place]  [address]       Ph: [phone]       Fax: [phone]   RxID:   [id number] MELOXICAM 15 MG TABS (MELOXICAM) 1po two times a day  #180 x 3   Entered and Authorized by:   [name] MD   Signed by:   [name] MD on 06/24/2009   Method used:   Electronically to        [facility]. #[id number]* (retail)       [place]  [place], [place]  [address]       Ph: [phone]       Fax: [phone]   RxID:    [id number] OLMBEMLJ 449 MG TABS (METAXALONE) 1 by mouth four times per day  #120 x 5   Entered and Authorized by:   [name] MD   Signed by:   [name] MD on 06/24/2009   Method used:   Print then Give to Patient   RxID:   [id number] BENAZEPRIL HCL 20 MG TABS (BENAZEPRIL HCL) 1po once daily  #90 x 3   Entered and Authorized by:   [name] MD   Signed by:   [name] MD on 06/24/2009   Method used:   Electronically to        [facility]. #[id number]* (retail)       [place], [place]  [address]       Ph: [phone]       Fax: [phone]   RxID:   [id number] PLAVIX 75 MG TABS (CLOPIDOGREL BISULFATE) 1po once daily  #90 x 3   Entered and Authorized by:   [name] MD   Signed by:   [name] MD on 06/24/2009   Method used:   Electronically to        [facility]. #[id number]* (retail)       [place], [place]  [address]       Ph: [phone]       Fax: [phone]   RxID:   [id number] MIRTAZAPINE 30 MG TABS (MIRTAZAPINE) 1 by mouth at bedtime  #90 x 3   Entered and Authorized by:   [name] MD   Signed by:   [name] MD on 06/24/2009   Method used:   Print then Give to Patient   RxID:   [id number] CYMBALTA 60 MG CPEP (DULOXETINE HCL) 1 by mouth once daily  #90 x 3   Entered and Authorized by:   [name] MD   Signed by:   [name] MD on 06/24/2009   Method used:   Electronically to        [facility]. #[id number]* (retail)       [place], [place]  [address]       Ph: [phone]       Fax: [phone]   RxID:   [id number] OXYCODONE HCL 15 MG TABS (OXYCODONE HCL) 1 by mouth four times per day - to fill Aug 19, 2009  #120 x 0   Entered and Authorized by:   [name] MD   Signed by:   [name] MD on 06/24/2009   Method used:   Print then Give to Patient   RxID:   [id number] OXYCODONE HCL 15 MG TABS (OXYCODONE HCL) 1 by mouth  four times per day - to fill Jul 20, 2009  #120 x 0   Entered and Authorized by:   [name] MD   Signed by:   [name] MD on 06/24/2009   Method used:   Print then Give to Patient   RxID:   [id number] OXYCODONE HCL 15 MG TABS (OXYCODONE HCL) 1 by mouth four times per day - to fill Jun 24, 2009  #120 x 0   Entered  and Authorized by:   [name] MD   Signed by:   [name] MD on 06/24/2009   Method used:   Print then Give to Patient   RxID:   [id number] ATENOLOL 25 MG TABS (ATENOLOL) 1po once daily  #90 x 32   Entered and Authorized by:   [name] MD   Signed by:   [name] MD on 06/24/2009   Method used:   Print then Give to Patient   RxID:   [id number] GABAPENTIN 800 MG TABS (GABAPENTIN) 1 by mouth three times a day  #270 x 1   Entered and Authorized by:   [name] MD   Signed by:   [name] MD on 06/24/2009   Method used:   Print then Give to Patient   RxID:   [id number] ISOSORBIDE MONONITRATE CR 30 MG XR24H-TAB (ISOSORBIDE MONONITRATE) 1 by mouth once daily  #90 x 3   Entered and Authorized by:   [name] MD   Signed by:   [name] MD on 06/24/2009   Method used:   Electronically to        [facility]. #[id number]* (retail)       [place], [place]  [address]       Ph: [phone]       Fax: [phone]   RxID:   [id number]

## 2010-06-23 NOTE — Assessment & Plan Note (Signed)
Summary: severe left ear ache--ss   Vital Signs:  Patient profile:   61 year old male Height:      72 inches Weight:      356 pounds BMI:     48.46 O2 Sat:      96 % on Room air Temp:     97.3 degrees F oral Pulse rate:   66 / minute Pulse rhythm:   regular Resp:     16 per minute BP sitting:   142 / 72  (left arm) Cuff size:   large  Vitals Entered By: Shirlean Mylar Ewing CMA Deborra Medina) (June 09, 2010 1:05 PM)  Nutrition Counseling: Patient's BMI is greater than 25 and therefore counseled on weight management options.  O2 Flow:  Room air CC: Left ear pain, congestion/RE   Primary Care Provider:  Dr. Jenny Reichmann  CC:  Left ear pain and congestion/RE.  History of Present Illness: New to me this gentleman has been having URI symptoms for 5 days and now has left ear pain with dizziness and a mild decrease in his hearing.  Preventive Screening-Counseling & Management  Alcohol-Tobacco     Alcohol drinks/day: 0     Alcohol Counseling: not indicated; patient does not drink     Smoking Status: quit     Tobacco Counseling: to remain off tobacco products  Hep-HIV-STD-Contraception     Hepatitis Risk: no risk noted     HIV Risk: no risk noted     STD Risk: no risk noted      Drug Use:  no.    Clinical Review Panels:  Prevention   Last Colonoscopy:  normal (05/22/2008)   Last PSA:  0.39 (10/20/2009)  Immunizations   Last Tetanus Booster:  Tdap (05/22/2004)   Last Flu Vaccine:  Fluvax 3+ (03/14/2010)   Last Pneumovax:  given (02/19/2009)   Last PPD Date Read:  03/16/2010 (03/14/2010)   Last PPD Result:  negative (03/14/2010)  Lipid Management   Cholesterol:  119 (05/30/2010)   LDL (bad choesterol):  51 (05/30/2010)   HDL (good cholesterol):  38.90 (05/30/2010)  Diabetes Management   HgBA1C:  7.0 (05/30/2010)   Creatinine:  0.8 (05/30/2010)   Last Flu Vaccine:  Fluvax 3+ (03/14/2010)   Last Pneumovax:  given (02/19/2009)  CBC   WBC:  6.5 (05/30/2010)   RBC:  4.31  (05/30/2010)   Hgb:  13.0 (05/30/2010)   Hct:  38.6 (05/30/2010)   Platelets:  256.0 (05/30/2010)   MCV  89.7 (05/30/2010)   MCHC  33.7 (05/30/2010)   RDW  14.8 (05/30/2010)   PMN:  70.3 (05/30/2010)   Lymphs:  20.2 (05/30/2010)   Monos:  7.7 (05/30/2010)   Eosinophils:  1.4 (05/30/2010)   Basophil:  0.4 (05/30/2010)  Complete Metabolic Panel   Glucose:  179 (05/30/2010)   Sodium:  139 (05/30/2010)   Potassium:  4.5 (05/30/2010)   Chloride:  104 (05/30/2010)   CO2:  29 (05/30/2010)   BUN:  14 (05/30/2010)   Creatinine:  0.8 (05/30/2010)   Albumin:  3.7 (05/30/2010)   Total Protein:  6.4 (05/30/2010)   Calcium:  9.0 (05/30/2010)   Total Bili:  0.5 (05/30/2010)   Alk Phos:  63 (05/30/2010)   SGPT (ALT):  22 (05/30/2010)   SGOT (AST):  24 (05/30/2010)   Medications Prior to Update: 1)  Gabapentin 800 Mg Tabs (Gabapentin) .[name].. 1 By Mouth Three Times A Day 2)  Atenolol 25 Mg Tabs (Atenolol) .[name].. 1po Once Daily 3)  Oxycodone Hcl 15 Mg  Tabs (Oxycodone Hcl) .[name].. 1 By Mouth Four Times Per Day - To Fill Sept 27, 2011 4)  Bupropion Hcl 300 Mg Xr24h-Tab (Bupropion Hcl) .[name].. 1po Once Daily 5)  Mirtazapine 30 Mg Tabs (Mirtazapine) .[name].. 1 By Mouth At Bedtime 6)  Effient 10 Mg Tabs (Prasugrel Hcl) .... Take Three Tablets The First Day Then One Tablet Daily 7)  Benazepril Hcl 20 Mg Tabs (Benazepril Hcl) .[name].. 1po Once Daily 8)  Flexeril 5 Mg Tabs (Cyclobenzaprine Hcl) .... Generic - 1 By Mouth Three Times A Day As Needed 9)  Lipitor 20 Mg Tabs (Atorvastatin Calcium) .[name].. 1po Once Daily - Generic 10)  Amlodipine Besylate 5 Mg Tabs (Amlodipine Besylate) .[name].. 1p O Once Daily 11)  Zolpidem Tartrate 10 Mg Tabs (Zolpidem Tartrate) .... 2 By Mouth At Bedtime As Needed 12)  Nitrofurantoin Macrocrystal 50 Mg Caps (Nitrofurantoin Macrocrystal) .[name].. 1 By Mouth Once Daily 13)  Fluticasone Propionate 50 Mcg/act Susp (Fluticasone Propionate) .... 2 Spray/side Once Daily 14)  Lidoderm 5 % Ptch (Lidocaine)  .[name].. 1 Patch Three Times A Day As Needed Pain 15)  Voltaren 1 % Gel (Diclofenac Sodium) .... Use Asd Three Times A Day 16)  Nitrostat 0.3 Mg Subl (Nitroglycerin) .... Use Asd As Needed 17)  Aspir-Low 81 Mg Tbec (Aspirin) .... One Tablet Daily 18)  Indomethacin 25 Mg Caps (Indomethacin) .... 2 By Mouth Daily 19)  Levitra 20 Mg Tabs (Vardenafil Hcl) .[name].. 1po Every Other Day As Needed 20)  Fluoxetine Hcl 20 Mg Caps (Fluoxetine Hcl) .[name].. 1po Once Daily  Current Medications (verified): 1)  Gabapentin 800 Mg Tabs (Gabapentin) .[name].. 1 By Mouth Three Times A Day 2)  Atenolol 25 Mg Tabs (Atenolol) .[name].. 1po Once Daily 3)  Oxycodone Hcl 15 Mg Tabs (Oxycodone Hcl) .[name].. 1 By Mouth Four Times Per Day - To Fill Sept 27, 2011 4)  Bupropion Hcl 300 Mg Xr24h-Tab (Bupropion Hcl) .[name].. 1po Once Daily 5)  Mirtazapine 30 Mg Tabs (Mirtazapine) .[name].. 1 By Mouth At Bedtime 6)  Effient 10 Mg Tabs (Prasugrel Hcl) .... Take Three Tablets The First Day Then One Tablet Daily 7)  Benazepril Hcl 20 Mg Tabs (Benazepril Hcl) .[name].. 1po Once Daily 8)  Flexeril 5 Mg Tabs (Cyclobenzaprine Hcl) .... Generic - 1 By Mouth Three Times A Day As Needed 9)  Lipitor 20 Mg Tabs (Atorvastatin Calcium) .[name].. 1po Once Daily - Generic 10)  Amlodipine Besylate 5 Mg Tabs (Amlodipine Besylate) .[name].. 1p O Once Daily 11)  Zolpidem Tartrate 10 Mg Tabs (Zolpidem Tartrate) .... 2 By Mouth At Bedtime As Needed 12)  Nitrofurantoin Macrocrystal 50 Mg Caps (Nitrofurantoin Macrocrystal) .[name].. 1 By Mouth Once Daily 13)  Fluticasone Propionate 50 Mcg/act Susp (Fluticasone Propionate) .... 2 Spray/side Once Daily 14)  Lidoderm 5 % Ptch (Lidocaine) .[name].. 1 Patch Three Times A Day As Needed Pain 15)  Voltaren 1 % Gel (Diclofenac Sodium) .... Use Asd Three Times A Day 16)  Nitrostat 0.3 Mg Subl (Nitroglycerin) .... Use Asd As Needed 17)  Aspir-Low 81 Mg Tbec (Aspirin) .... One Tablet Daily 18)  Indomethacin 25 Mg Caps (Indomethacin) .... 2 By Mouth Daily 19)   Levitra 20 Mg Tabs (Vardenafil Hcl) .[name].. 1po Every Other Day As Needed 20)  Fluoxetine Hcl 20 Mg Caps (Fluoxetine Hcl) .[name].. 1po Once Daily 21)  Amoxicillin-Pot Clavulanate 500-125 Mg Tabs (Amoxicillin-Pot Clavulanate) .... One By Mouth Three Times A Day For 10 Days  Allergies (verified): No Known Drug Allergies  Past History:  Past Medical History:  Last updated: 05/30/2010 1. CORONARY ARTERY DISEASE (ICD-414.00): Has had multiple PCIs, reports he never had an MI (just angina).  12/08 had Taxus DES to mLAD, Endeavor DES to [facility] and to dCFX.  11/09 had BMS x 2 to mid RCA.  1/09 had Xience DES to dCFX, mCFX, and pCFX.  Lexiscan myoview (3/11): EF 50%, apical hypokinesis, possible small inferoapical infarct with no ischemia.  Low risk.  2.  HYPERTENSION (ICD-401.9) 3. DEPRESSION (ICD-311) 4. URETHRAL STRICTURE (ICD-598.9) 5. NEPHROLITHIASIS, HX OF (ICD-V13.01) 6. ALLERGIC RHINITIS (ICD-477.9) 7. DEGENERATIVE JOINT DISEASE (ICD-715.90): s/p bilateral TKRs 8. OSA: Not using CPAP very often.  9. Hyperlipidemia 10. Obesity 11. Echo (3/11): EF 55-60%, normal wall motion, grade I diastolic dysfunction, normal valves, normal RV.  12. Clopidogrel resistant 13. Rotator cuff repair 4/11 Lumbar Disc disease glucose intolerance  Past Surgical History: Last updated: 09/17/2009 Tonsillectomy Rotator cuff repair - bilat s/p knee replacement 2008 s/p bilat wrist surgury s/p urethral dilations , mult in 2009, 2010 Carpal tunnel release s/p right knee arthroscopy 03/2005 and mult prior s/p umbilical hernia s/p left knee arthroscopy june 2002 and mult prior s/p right shoulder rotater cuff surgury apr 2011 - Dr [name] Lighter  Family History: Last updated: 06/28/2009 mother with met cancer, unknown primary, DJD, heart disease, depression, HTN, breast cancer father with DM, heart disease/CABG,  skin cancers, HTN, elev chol, and prostate cancer grandparent wih DJD, depression, CAD, HTN brother x 2 with  HTN, depression  Social History: Last updated: 07/15/2009 Divorced, moved here from [place].  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   Risk Factors: Alcohol Use: 0 (06/09/2010)  Risk Factors: Smoking Status: quit (06/09/2010)  Family History: Reviewed history from 06/28/2009 and no changes required. mother with met cancer, unknown primary, DJD, heart disease, depression, HTN, breast cancer father with DM, heart disease/CABG,  skin cancers, HTN, elev chol, and prostate cancer grandparent wih DJD, depression, CAD, HTN brother x 2 with HTN, depression  Social History: Reviewed history from 07/15/2009 and no changes required. Divorced, moved here from [place].  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  Hepatitis Risk:  no risk noted HIV Risk:  no risk noted STD Risk:  no risk noted  Review of Systems       The patient complains of decreased hearing.  The patient denies anorexia, fever, weight loss, weight gain, chest pain, peripheral edema, prolonged cough, headaches, hemoptysis, abdominal pain, hematuria, suspicious skin lesions, and enlarged lymph nodes.   ENT:  Complains of decreased hearing, ear discharge, earache, nasal congestion, postnasal drainage, and sore throat; denies difficulty swallowing, nosebleeds, ringing in ears, and sinus pressure. Resp:  Denies cough, shortness of breath, sputum productive, and wheezing.  Physical Exam  General:  alert, well-developed, well-nourished, well-hydrated, appropriate dress, normal appearance, healthy-appearing, and overweight-appearing.   Head:  normocephalic, atraumatic, no abnormalities observed, and no  abnormalities palpated.   Eyes:  vision grossly intact, pupils equal, and no injection.   Ears:  R ear normal.  Left ear shows the TM is ruptured and there is pus pouring out. The EAC is not swollen or red and the pinna pull test is negative. Nose:  External  nasal examination shows no deformity or inflammation. Nasal mucosa are pink and moist without lesions or exudates. Mouth:  Oral mucosa and oropharynx without lesions or exudates.  Teeth in good repair. Neck:  supple and no masses.   Lungs:  normal respiratory effort, no intercostal retractions, no accessory muscle use, normal breath sounds, no dullness, no fremitus, no crackles, and no wheezes.   Heart:  normal rate, regular rhythm, no murmur, no gallop, and no rub.   Abdomen:  soft, non-tender, normal bowel sounds, no distention, no masses, no guarding, no rigidity, and no rebound tenderness.   Msk:  No deformity or scoliosis noted of thoracic or lumbar spine.   Pulses:  R and L carotid,radial,femoral,dorsalis pedis and posterior tibial pulses are full and equal bilaterally Extremities:  No clubbing, cyanosis, edema, or deformity noted with normal full range of motion of all joints.   Neurologic:  No cranial nerve deficits noted. Station and gait are normal. Plantar reflexes are down-going bilaterally. DTRs are symmetrical throughout. Sensory, motor and coordinative functions appear intact. Skin:  turgor normal, color normal, no rashes, no suspicious lesions, no ecchymoses, no petechiae, no purpura, no ulcerations, and no edema.   Cervical Nodes:  no anterior cervical adenopathy and no posterior cervical adenopathy.   Axillary Nodes:  no R axillary adenopathy and no L axillary adenopathy.   Psych:  Cognition and judgment appear intact. Alert and cooperative with normal attention span and concentration. No apparent delusions, illusions, hallucinations   Impression & Recommendations:  Problem # 1:  ACUT SUPPRATV OTITIS MEDIA W/SPONT RUP  EARDRUM (ICD-382.01) Assessment New keep the ear clean and dry, report any new or worsening symptoms to me His updated medication list for this problem includes:    Aspir-low 81 Mg Tbec (Aspirin) ..... One tablet daily    Indomethacin 25 Mg Caps (Indomethacin) .[name]... 2 by mouth daily    Amoxicillin-pot Clavulanate 500-125 Mg Tabs (Amoxicillin-pot clavulanate) ..... One by mouth three times a day for 10 days  Complete Medication List: 1)  Gabapentin 800 Mg Tabs (Gabapentin) .[name].. 1 by mouth three times a day 2)  Atenolol 25 Mg Tabs (Atenolol) .[name].. 1po once daily 3)  Oxycodone Hcl 15 Mg Tabs (Oxycodone hcl) .[name].. 1 by mouth four times per day - to fill sept 27, 2011 4)  Bupropion Hcl 300 Mg Xr24h-tab (Bupropion hcl) .[name].. 1po once daily 5)  Mirtazapine 30 Mg Tabs (Mirtazapine) .[name].. 1 by mouth at bedtime 6)  Effient 10 Mg Tabs (Prasugrel hcl) .... Take three tablets the first day then one tablet daily 7)  Benazepril Hcl 20 Mg Tabs (Benazepril hcl) .[name].. 1po once daily 8)  Flexeril 5 Mg Tabs (Cyclobenzaprine hcl) .... Generic - 1 by mouth three times a day as needed 9)  Lipitor 20 Mg Tabs (Atorvastatin calcium) .[name].. 1po once daily - generic 10)  Amlodipine Besylate 5 Mg Tabs (Amlodipine besylate) .[name].. 1p o once daily 11)  Zolpidem Tartrate 10 Mg Tabs (Zolpidem tartrate) .... 2 by mouth at bedtime as needed 12)  Nitrofurantoin Macrocrystal 50 Mg Caps (Nitrofurantoin macrocrystal) .[name].. 1 by mouth once daily 13)  Fluticasone Propionate 50 Mcg/act Susp (Fluticasone propionate) .... 2 spray/side once daily 14)  Lidoderm 5 % Ptch (Lidocaine) .[name].. 1 patch three times a day as needed pain 15)  Voltaren 1 % Gel (Diclofenac sodium) .... Use asd three times a day 16)  Nitrostat 0.3 Mg Subl (Nitroglycerin) .... Use asd as needed 17)  Aspir-low 81 Mg Tbec (Aspirin) .... One  tablet daily 18)  Indomethacin 25 Mg Caps (Indomethacin) .... 2 by mouth daily 19)  Levitra 20 Mg Tabs (Vardenafil hcl) .[name].. 1po every  other day as needed 20)  Fluoxetine Hcl 20 Mg Caps (Fluoxetine hcl) .[name].. 1po once daily 21)  Amoxicillin-pot Clavulanate 500-125 Mg Tabs (Amoxicillin-pot clavulanate) .... One by mouth three times a day for 10 days  Patient Instructions: 1)  Please schedule a follow-up appointment in 2 weeks. 2)  Get plenty of rest, drink lots of clear liquids, and use Tylenol or Ibuprofen for fever and comfort. Return in 7-10 days if you're not better:sooner if you're feeling worse. 3)  Take your antibiotic as prescribed until ALL of it is gone, but stop if you develop a rash or swelling and contact our office as soon as possible. Prescriptions: AMOXICILLIN-POT CLAVULANATE 500-125 MG TABS (AMOXICILLIN-POT CLAVULANATE) One by mouth three times a day for 10 days  #30 x 0   Entered and Authorized by:   Janith Lima MD   Signed by:   Janith Lima MD on 06/09/2010   Method used:   Electronically to        [facility] #[id number]* (retail)       [place], [place]  [address]       Ph: [phone]       Fax: [phone]   RxID:   [id number]    Orders Added: 1)  Est. Patient Level IV [81594]

## 2010-06-23 NOTE — Progress Notes (Signed)
Summary: Bleeding ear  Phone Note Call from Patient   Caller: Patient [phone] Summary of Call: pt called stating that he woke up Friday morning wth blood on his pillow and since then his ear has been draining blood-tinged fluid, he is unable to hear clearly out of that ear, he does hear a loud "tone". please advise Initial call taken by: Crissie Sickles, [place],  July 07, 2009 3:31 PM  Follow-up for Phone Call        sounds like probable ear canal infection, or even ruptured ear drum - needs OV (any MD ok) Follow-up by: [name] MD,  July 07, 2009 3:38 PM  Additional Follow-up for Phone Call Additional follow up Details #1::        pt informed, transferred to schedule apt Additional Follow-up by: Crissie Sickles, [place],  July 07, 2009 3:53 PM

## 2010-06-23 NOTE — Progress Notes (Signed)
Summary: PT WANTS TO KNOW Y RX NOT CALLED IN  Phone Note Call from Patient Call back at Home Phone [phone]   Caller: Patient Reason for Call: Talk to Nurse Summary of Call: PT CALLED ON THUSDAY 10.13.11 FOR REFILL. PT STATES RX WAS NOT CALLED IN.  PT WANTS TO KNOW IF HE CAN HAVE RX REFILL BUT CHANGE THE MG TO 100MG SO HE CAN BREAK THEM IN HALF SO HE CAN SAVE MONEY ON REFILL. PT HAS BEEN TAKEN TWO OR THREE OF 25MG FOR THE RX TO WORK. [facility] # [id number]  Initial call taken by: Regan Lemming,  March 07, 2010 10:49 AM  Follow-up for Phone Call        per Dr Claris Gladden prev. note ok to give pt 18m, pt is aware and will break tabs in half, [facility]  March 07, 2010 11:17 AM     New/Updated Medications: VIAGRA 100 MG TABS (SILDENAFIL CITRATE) as needed Prescriptions: VIAGRA 100 MG TABS (SILDENAFIL CITRATE) as needed  #4 x 2   Entered by:   HKevan Rosebush RN   Authorized by:   DLoralie Champagne MD   Signed by:   HKevan Rosebush RN on 03/07/2010   Method used:   Electronically to        [id number] (retail)       [address] [place]  [address]      Ph: [phone]      Fax: [phone]  RxID:   [id number]

## 2010-06-23 NOTE — Progress Notes (Signed)
  Faxed ROI over to [facility] 2/24,3/10, recieved records back today forwarded to [facility] for 08/10/09 appt with Mclean.Fax [place]  July 29, 2009 10:59 AM

## 2010-06-23 NOTE — Letter (Signed)
Summary: TB Skin Test  All     ,     Phone:   Fax:           TB Skin Test    [name]    Date TB Test Placed: October 24th 2011 L  forearm  TB Test Placed by:  Crissie Sickles, CMA   Lot #:  [id number]        Expiration Date: 10/17/2011  Date TB Test Read:  March 16, 2010     Result <5 MM  TB Test Read by: Crissie Sickles, CMA  March 16, 2010 1:13 PM

## 2010-06-23 NOTE — Medication Information (Signed)
Summary: [place]   Imported By: Bubba Hales 12/07/2009 07:19:30  _____________________________________________________________________  External Attachment:    Type:   Image     Comment:   External Document

## 2010-06-23 NOTE — Letter (Signed)
Summary: [facility] Medical Clearance   [facility] Medical Clearance   Imported By: Sallee Provencal 08/26/2009 11:57:17  _____________________________________________________________________  External Attachment:    Type:   Image     Comment:   External Document

## 2010-06-23 NOTE — Progress Notes (Signed)
Summary: pt needs letter of clearence asap  Phone Note Call from Patient Call back at Home Phone [phone]   Caller: Patient Reason for Call: Talk to Nurse, Talk to Doctor Summary of Call: pt is to have shoulder surgery with [facility] and needs his letter of clearence because he has to be off his plavix for one week. per pt [facility] faxed [place] letter of clearence last week and we still have not responded and pt is in alot of pain and wants to take care of this  Initial call taken by: Shelda Pal,  August 17, 2009 10:32 AM  Follow-up for Phone Call        Called pt.  We saw patient before he saw Dr Cay Schillings and told him he needed  surgery.   Called over and requested the clearance form .   Will Leave for Dr Aundra Dubin to review and decide on stopping  Plavix.  per Dr Maceo Pro for pt to hold Plavix x 7 days if pt has not had a stent done in the last year--past procedures in [place] do not have records--I will call and talk with pt LMVM for pt to call me Desiree [name], RN, BSN  August 18, 2009 11:07 AM      Follow-up by: Gasper Sells, EMT,  August 17, 2009 10:57 AM     Appended Document: pt needs letter of clearence asap Patient has not had PCI since 11/09.  He had a low risk myoview in 3/11.  He has at least 7 stents so will be at higher risk of peri-operative event but if surgery is absolutely needed he can stop Effient 5 days pre-procedure, continue ASA 81 mg through the procedure, and continue his beta blocker peri-operatively.   Appended Document: pt needs letter of clearence asap discussed Dr Claris Gladden  recommendations  with patient--will fax surgical clearance form  to Dr [name] Lighter   Clinical Lists Changes

## 2010-06-23 NOTE — Progress Notes (Signed)
Summary: RX REFILL  Phone Note Refill Request Message from:  Patient on March 03, 2010 12:48 PM  Refills Requested: Medication #1:  VIAGRA 25 MG TABS use as directed as needed. PT WANTS TO KNOW IF HE CAN HAVE RX REFILL BUT CHANGE THE MG TO 100MG SO HE CAN BREAK THEM IN HALF SO HE CAN SAVE MONEY ON REFILL. PT HAS BEEN TAKEN TWO OR THREE OF 25MG FOR THE RX TO WORK. [facility] # [phone]   Initial call taken by: Regan Lemming,  March 03, 2010 12:48 PM  Follow-up for Phone Call        I will review with Dr Karlyne Greenspan, RN, BSN  March 03, 2010 3:18 PM      Appended Document: RX REFILL OK to do this.

## 2010-06-23 NOTE — Assessment & Plan Note (Signed)
Summary: tb skin test/john/cd  Nurse Visit   Allergies: No Known Drug Allergies  Immunizations Administered:  PPD Skin Test:    Vaccine Type: PPD    Site: left forearm    Mfr: Sanofi Pasteur    Dose: 0.1 ml    Route: ID    Given by: Crissie Sickles, CMA    Exp. Date: 10/17/2011    Lot #: [id number]  PPD Results    Date of reading: 03/16/2010    Results: < 68m    Interpretation: negative  Orders Added: 1)  Flu Vaccine 320yr+ MEDICARE PATIENTS [Q2039] 2)  Administration Flu vaccine - MCR [G0008] 3)  TB Skin Test [8[73668])  Admin 1st Vaccine [9[15947]lu Vaccine Consent Questions     Do you have a history of severe allergic reactions to this vaccine? no    Any prior history of allergic reactions to egg and/or gelatin? no    Do you have a sensitivity to the preservative Thimersol? no    Do you have a past history of Guillan-Barre Syndrome? no    Do you currently have an acute febrile illness? no    Have you ever had a severe reaction to latex? no    Vaccine information given and explained to patient? yes    Are you currently pregnant? no    Lot Number:[id number]   Exp Date:11/19/2010   Site Given  Left Deltoid IMistration Flu vaccine - MCR [G[M7615]lbmedflu

## 2010-06-23 NOTE — Progress Notes (Signed)
Summary: test results  Phone Note Call from Patient Call back at Home Phone [phone] Call back at [phone]- cell phone    Caller: Patient Reason for Call: Talk to Nurse, Lab or Test Results Initial call taken by: Neil Crouch,  July 16, 2009 11:46 AM  Follow-up for Phone Call        Spoke with patient, lab results given.  Follow-up by: Carollee Sires, RN, BSN,  July 16, 2009 11:53 AM

## 2010-06-23 NOTE — Progress Notes (Signed)
Summary: refll request/2nd call/effient refill  Phone Note Refill Request Message from:  Patient on April 26, 2010 1:24 PM  Refills Requested: Medication #1:  EFFIENT 10 MG TABS take three tablets the first day then one tablet daily [facility] [address]   Method Requested: Telephone to Pharmacy Initial call taken by: Lorenda Hatchet,  April 26, 2010 1:24 PM  Follow-up for Phone Call        pt needs meds. pt would like nurse to call it to [facility]. pt states he has called twice. pt needs meds pt is going out of town tomorrow. Follow-up by: Regan Lemming,  April 27, 2010 3:08 PM    Prescriptions: EFFIENT 10 MG TABS (PRASUGREL HCL) take three tablets the first day then one tablet daily  #30 x 3   Entered by:   Philemon Kingdom   Authorized by:   Loralie Champagne, MD   Signed by:   Philemon Kingdom on 04/27/2010   Method used:   Electronically to        [facility] #[id number]* (retail)       [place], [place]  [address]       Ph: [phone]       Fax: [phone]   RxID:   [id number]

## 2010-06-23 NOTE — Letter (Signed)
Summary: CMN for arch supports/[facility]  CMN for arch supports/[facility]   Imported By: Phillis Knack 03/08/2010 09:01:57  _____________________________________________________________________  External Attachment:    Type:   Image     Comment:   External Document

## 2010-06-23 NOTE — Progress Notes (Signed)
Summary: [id number] will go 06-08-09 to [facility]  Phone Note Outgoing Call   Call placed by: Desiree [name], RN, BSN,  August 05, 2009 3:12 PM Call placed to: Patient Summary of Call: P2Y12   Follow-up for Phone Call        [facility] for pt--I am trying to see if pt had P2Y12 done Desiree [name], RN, BSN  August 05, 2009 3:12 PM -pt states he has not had P2Y12 test yet--he will go have it done at [place], RN, BSN  August 05, 2009 3:29 PM

## 2010-06-23 NOTE — Progress Notes (Signed)
Summary: Med list and history/[facility]  Med list and history/[facility]   Imported By: Bubba Hales 06/25/2009 08:48:44  _____________________________________________________________________  External Attachment:    Type:   Image     Comment:   External Document

## 2010-06-23 NOTE — Letter (Signed)
Summary: Appointment - Reminder [facility], [employer]  [address] [place]   [place], [place] [address]   Phone: [phone]  Fax: [phone]     Oct 08, 2009 MRN: [id number]   MERLON ALCORTA [address] [place], [place]  [address]   Dear Mr. AGOSTO,  [facility] records indicate that it is time to schedule a follow-up appointment with Dr. Aundra Dubin. It is very important that we reach you to schedule this appointment. We look forward to participating in your health care needs. Please contact us at the number listed above at your earliest convenience to schedule your appointment.  If you are unable to make an appointment at this time, give [place] a call so we can update our records.     Sincerely,   Darnell Level [facility] Scheduling Team

## 2010-06-23 NOTE — Progress Notes (Signed)
Summary: Nuclear Pre-Procedure  Phone Note Outgoing Call   Call placed by: Perrin Maltese, EMT-P,  July 20, 2009 3:35 PM Summary of Call: Reviewed information on [employer] Information Sheet (see scanned document for further details).  Spoke with patient.     Nuclear Med Background Indications for Stress Test: Evaluation for Ischemia, Stent Patency   History: Heart Catheterization, Stents   Symptoms: Chest Pain with Exertion, Chest Tightness with Exertion, DOE    Nuclear Pre-Procedure Cardiac Risk Factors: Family History - CAD, History of Smoking, Hypertension, Lipids, Obesity Height (in): 60  Nuclear Med Study Referring MD:  D.Mclean

## 2010-06-23 NOTE — Assessment & Plan Note (Signed)
Summary: LEFT EARDRUM RUPTURE? PER DAHLIA/CD   Vital Signs:  Patient profile:   60 year old male Height:      72 inches Weight:      372.75 pounds BMI:     50.74 O2 Sat:      98 % on Room air Temp:     98.6 degrees F oral Pulse rate:   70 / minute BP sitting:   136 / 68  (left arm) Cuff size:   large  Vitals Entered ByShirlean Mylar Ewing (July 08, 2009 11:02 AM)  O2 Flow:  Room air CC: left ear pain, cough/RE   CC:  left ear pain and cough/RE.  History of Present Illness: here wtih acute onset since feb 11 of mild left ear reddish d/c/blood with mild discomfort; but no headache , fever/chills.  and Pt denies CP, sob, doe, wheezing, orthopnea, pnd, worsening LE edema, palps, dizziness or syncope   Had some blood on Qtip as well.  Yesterday with osnet ST and worsening prod cough greenish sputum as well, without pleuritic CP or the above.  No myalgias, headache, rash, joint pain, diarrhea or blood.    Problems Prior to Update: 1)  Otitis Externa, Left  (ICD-380.10) 2)  Bronchitis-acute  (ICD-466.0) 3)  Coronary Artery Disease  (ICD-414.00) 4)  Hypertension  (ICD-401.9) 5)  Chest Pain  (ICD-786.50) 6)  Depression  (ICD-311) 7)  Fatigue  (ICD-780.79) 8)  Special Screening Malig Neoplasms Other Sites  (ICD-V76.49) 9)  Fever  (ICD-780.60) 10)  Uri  (ICD-465.9) 11)  Ankle Pain, Right  (ICD-719.47) 12)  Shoulder Pain, Bilateral  (ICD-719.41) 13)  Urethral Stricture  (ICD-598.9) 14)  Preventive Health Care  (ICD-V70.0) 15)  Nephrolithiasis, Hx of  (ICD-V13.01) 16)  Allergic Rhinitis  (ICD-477.9) 17)  Degenerative Joint Disease  (ICD-715.90)  Medications Prior to Update: 1)  Isosorbide Mononitrate Cr 30 Mg Xr24h-Tab (Isosorbide Mononitrate) .[name].. 1 By Mouth Once Daily 2)  Gabapentin 800 Mg Tabs (Gabapentin) .[name].. 1 By Mouth Three Times A Day 3)  Atenolol 25 Mg Tabs (Atenolol) .[name].. 1po Once Daily 4)  Oxycodone Hcl 15 Mg Tabs (Oxycodone Hcl) .[name].. 1 By Mouth Four Times Per Day - To  Fill Aug 19, 2009 5)  Cymbalta 60 Mg Cpep (Duloxetine Hcl) .[name].. 1 By Mouth Once Daily 6)  Mirtazapine 30 Mg Tabs (Mirtazapine) .[name].. 1 By Mouth At Bedtime 7)  Plavix 75 Mg Tabs (Clopidogrel Bisulfate) .[name].. 1po Once Daily 8)  Benazepril Hcl 20 Mg Tabs (Benazepril Hcl) .[name].. 1po Once Daily 9)  Skelaxin 800 Mg Tabs (Metaxalone) .[name].. 1 By Mouth Four Times Per Day 10)  Meloxicam 15 Mg Tabs (Meloxicam) .[name].. 1po Two Times A Day 11)  Crestor 10 Mg Tabs (Rosuvastatin Calcium) .[name].. 1po Once Daily 12)  Amlodipine Besylate 5 Mg Tabs (Amlodipine Besylate) .[name].. 1p O Once Daily 13)  Zolpidem Tartrate 10 Mg Tabs (Zolpidem Tartrate) .... 2 By Mouth At Bedtime As Needed 14)  Nitrofurantoin Macrocrystal 50 Mg Caps (Nitrofurantoin Macrocrystal) .[name].. 1 By Mouth Once Daily 15)  Nasonex 50 Mcg/act Susp (Mometasone Furoate) .... 2 Spray/side Per Day 16)  Lidoderm 5 % Ptch (Lidocaine) .[name].. 1 Patch Three Times A Day As Needed Pain 17)  Voltaren 1 % Gel (Diclofenac Sodium) .... Use Asd Three Times A Day 18)  Nitrostat 0.3 Mg Subl (Nitroglycerin) .... Use Asd As Needed 19)  Ecotrin 325 Mg Tbec (Aspirin) .[name].. 1po Once Daily  Current Medications (verified): 1)  Isosorbide Mononitrate Cr 30 Mg Xr24h-Tab (Isosorbide  Mononitrate) .[name].. 1 By Mouth Once Daily 2)  Gabapentin 800 Mg Tabs (Gabapentin) .[name].. 1 By Mouth Three Times A Day 3)  Atenolol 25 Mg Tabs (Atenolol) .[name].. 1po Once Daily 4)  Oxycodone Hcl 15 Mg Tabs (Oxycodone Hcl) .[name].. 1 By Mouth Four Times Per Day - To Fill Aug 19, 2009 5)  Cymbalta 60 Mg Cpep (Duloxetine Hcl) .[name].. 1 By Mouth Once Daily 6)  Mirtazapine 30 Mg Tabs (Mirtazapine) .[name].. 1 By Mouth At Bedtime 7)  Plavix 75 Mg Tabs (Clopidogrel Bisulfate) .[name].. 1po Once Daily 8)  Benazepril Hcl 20 Mg Tabs (Benazepril Hcl) .[name].. 1po Once Daily 9)  Skelaxin 800 Mg Tabs (Metaxalone) .[name].. 1 By Mouth Four Times Per Day 10)  Meloxicam 15 Mg Tabs (Meloxicam) .[name].. 1po Two Times A Day 11)  Crestor 10 Mg Tabs (Rosuvastatin  Calcium) .[name].. 1po Once Daily 12)  Amlodipine Besylate 5 Mg Tabs (Amlodipine Besylate) .[name].. 1p O Once Daily 13)  Zolpidem Tartrate 10 Mg Tabs (Zolpidem Tartrate) .... 2 By Mouth At Bedtime As Needed 14)  Nitrofurantoin Macrocrystal 50 Mg Caps (Nitrofurantoin Macrocrystal) .[name].. 1 By Mouth Once Daily 15)  Nasonex 50 Mcg/act Susp (Mometasone Furoate) .... 2 Spray/side Per Day 16)  Lidoderm 5 % Ptch (Lidocaine) .[name].. 1 Patch Three Times A Day As Needed Pain 17)  Voltaren 1 % Gel (Diclofenac Sodium) .... Use Asd Three Times A Day 18)  Nitrostat 0.3 Mg Subl (Nitroglycerin) .... Use Asd As Needed 19)  Ecotrin 325 Mg Tbec (Aspirin) .[name].. 1po Once Daily 20)  Cortisporin 3.5-10000-1 Soln (Neomycin-Polymyxin-Hc) .... Use Asd 2 Gtt's Four Times Per Day Left Ear For 10 Days 21)  Azithromycin 250 Mg Tabs (Azithromycin) .... 2po Qd For 1 Day, Then 1po Qd For 4days, Then Stop 22)  Tessalon Perles 100 Mg Caps (Benzonatate) .[name].. 1 - 2 By Mouth Three Times A Day As Needed Cough  Allergies (verified): No Known Drug Allergies  Past History:  Past Medical History: Last updated: 06/28/2009 Current Problems:  CORONARY ARTERY DISEASE (ICD-414.00) HYPERTENSION (ICD-401.9) CHEST PAIN (ICD-786.50) DEPRESSION (ICD-311) FATIGUE (ICD-780.79) SPECIAL SCREENING MALIG NEOPLASMS OTHER SITES (ICD-V76.49) FEVER (ICD-780.60) URI (ICD-465.9) ANKLE PAIN, RIGHT (ICD-719.47) SHOULDER PAIN, BILATERAL (ICD-719.41) URETHRAL STRICTURE (ICD-598.9) PREVENTIVE HEALTH CARE (ICD-V70.0) NEPHROLITHIASIS, HX OF (ICD-V13.01) ALLERGIC RHINITIS (ICD-477.9) DEGENERATIVE JOINT DISEASE (ICD-715.90)  Past Surgical History: Last updated: 06/28/2009 Tonsillectomy Rotator cuff repair - bilat s/p knee replacement 2008 s/p bilat wrist surgury s/p urethral dilations , mult in 2009, 2010 Carpal tunnel release s/p right knee arthroscopy 03/2005 and mult prior s/p umbilical hernia s/p left knee arthroscopy june 2002 and mult prior  Social  History: Last updated: 06/28/2009 Divorced 2 children - grown disabled since 2006 - due to ortho, heart, psych Former Smoker - rare cigar now Alcohol use-no - very occasionally 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   Eyes:  vision grossly intact, pupils equal, and pupils round.   Ears:  left canal with 1+ swelling, erythema, and mild d/c without active bleeding, bilat tm's mild red Nose:  nasal dischargemucosal pallor and mucosal edema.   Mouth:  pharyngeal erythema and fair dentition.   Neck:  supple and cervical lymphadenopathy.   Lungs:  normal respiratory effort and normal breath sounds.   Heart:  normal rate  and regular rhythm.   Extremities:  no edema, no erythema    Impression & Recommendations:  Problem # 1:  BRONCHITIS-ACUTE (ICD-466.0)  His updated medication list for this problem includes:    Azithromycin 250 Mg Tabs (Azithromycin) .[name]... 2po qd for 1 day, then 1po qd for 4days, then stop    Tessalon Perles 100 Mg Caps (Benzonatate) .[name]... 1 - 2 by mouth three times a day as needed cough treat as above, f/u any worsening signs or symptoms   Problem # 2:  OTITIS EXTERNA, LEFT (ICD-380.10)  His updated medication list for this problem includes:    Cortisporin 3.5-10000-1 Soln (Neomycin-polymyxin-hc) ..... Use asd 2 gtt's four times per day left ear for 10 days treat as above, f/u any worsening signs or symptoms   Problem # 3:  HYPERTENSION (ICD-401.9)  His updated medication list for this problem includes:    Atenolol 25 Mg Tabs (Atenolol) .[name]... 1po once daily    Benazepril Hcl 20 Mg Tabs (Benazepril hcl) .[name]... 1po once daily    Amlodipine Besylate 5 Mg Tabs (Amlodipine besylate) .[name]... 1p o once daily  BP today: 136/68 Prior BP: 130/80  (06/24/2009) stable overall by hx and exam, ok to continue meds/tx as is   Complete Medication List: 1)  Isosorbide Mononitrate Cr 30 Mg Xr24h-tab (Isosorbide mononitrate) .[name].. 1 by mouth once daily 2)  Gabapentin 800 Mg Tabs (Gabapentin) .[name].. 1 by mouth three times a day 3)  Atenolol 25 Mg Tabs (Atenolol) .[name].. 1po once daily 4)  Oxycodone Hcl 15 Mg Tabs (Oxycodone hcl) .[name].. 1 by mouth four times per day - to fill Aug 19, 2009 5)  Cymbalta 60 Mg Cpep (Duloxetine hcl) .[name].. 1 by mouth once daily 6)  Mirtazapine 30 Mg Tabs (Mirtazapine) .[name].. 1 by mouth at bedtime 7)  Plavix 75 Mg Tabs (Clopidogrel bisulfate) .[name].. 1po once daily 8)  Benazepril Hcl 20 Mg Tabs (Benazepril hcl) .[name].. 1po once daily 9)  Skelaxin 800 Mg Tabs (Metaxalone) .[name].. 1 by mouth four times per day 10)  Meloxicam 15 Mg Tabs (Meloxicam) .[name].. 1po two times a day 11)  Crestor 10 Mg Tabs (Rosuvastatin calcium) .[name].. 1po once daily 12)  Amlodipine Besylate 5 Mg Tabs (Amlodipine besylate) .[name].. 1p o once daily 13)  Zolpidem Tartrate 10 Mg Tabs (Zolpidem tartrate) .... 2 by mouth at bedtime as needed 14)  Nitrofurantoin Macrocrystal 50 Mg Caps (Nitrofurantoin macrocrystal) .[name].. 1 by mouth once daily 15)  Nasonex 50 Mcg/act Susp (Mometasone furoate) .... 2 spray/side per day 16)  Lidoderm 5 % Ptch (Lidocaine) .[name].. 1 patch three times a day as needed pain 17)  Voltaren 1 % Gel (Diclofenac sodium) .... Use asd three times a day 18)  Nitrostat 0.3 Mg Subl (Nitroglycerin) .... Use asd as needed 19)  Ecotrin 325 Mg Tbec (Aspirin) .[name].. 1po once daily 20)  Cortisporin 3.5-10000-1 Soln (Neomycin-polymyxin-hc) .... Use asd 2 gtt's four times per day left ear for 10 days 21)  Azithromycin 250 Mg Tabs (Azithromycin) .... 2po qd for 1 day, then 1po qd for 4days, then stop 22)  Tessalon Perles 100 Mg Caps (Benzonatate) .[name].. 1 - 2 by mouth three times a day as needed cough  Patient Instructions: 1)  Please take all new medications as prescribed 2)   Continue all previous medications as before this visit  3)  Please schedule a follow-up appointment in 3 months as recommended at your last office visit Prescriptions: TESSALON PERLES 100 MG CAPS (BENZONATATE) 1 -  2 by mouth three times a day as needed cough  #60 x 1   Entered and Authorized by:   [name] MD   Signed by:   [name] MD on 07/08/2009   Method used:   Print then Give to Patient   RxID:   [id number] AZITHROMYCIN 250 MG TABS (AZITHROMYCIN) 2po qd for 1 day, then 1po qd for 4days, then stop  #6 x 1   Entered and Authorized by:   [name] MD   Signed by:   [name] MD on 07/08/2009   Method used:   Print then Give to Patient   RxID:   [id number] [place] 3.5-10000-1 SOLN [facility]) use asd 2 gtt's four times per day left ear for 10 days  #1 x 0   Entered and Authorized by:   [name] MD   Signed by:   [name] MD on 07/08/2009   Method used:   Print then Give to Patient   RxID:   [id number]

## 2010-06-23 NOTE — Assessment & Plan Note (Signed)
Summary: YEARLY CHECK UP/PN   Vital Signs:  Patient profile:   60 year old male Height:      72.5 inches Weight:      366.13 pounds BMI:     49.15 O2 Sat:      97 % on Room air Temp:     98.7 degrees F oral Pulse rate:   72 / minute BP sitting:   144 / 70  (left arm) Cuff size:   large  Vitals Entered By: Shirlean Mylar Ewing CMA Deborra Medina) (May 30, 2010 1:09 PM)  O2 Flow:  Room air  Preventive Care Screening  PSA:    Date:  10/20/2009    Results:  0.39 ng/mL     PSA per urology  CC: Yearly/RE   Primary Care Provider:  Dr. Jenny Reichmann  CC:  Yearly/RE.  History of Present Illness: here to f/u;  has had to self cath several times recently, used to take the macrobid but not recently, and now with dyrusia, freq, urgency but no hematuria for several days;  no worseningback pain, n/v, chills, high fevers.    also cymbalta and effient not covered wiht insurance and asks for cymbalta change to wellbutrin and prozac, Denies worsening depressive symptoms, suicidal ideation, or panic.  but has ongoing anxiety.   also nasonex not covered by ins;  allergy symtpoms have been doing well - no signivcatn congesiton, fever, pain, d/c , itch or sneeze   Pt denies CP, worsening sob, doe, wheezing, orthopnea, pnd, worsening LE edema, palps, dizziness or syncope  Pt denies new neuro symptoms such as headache, facial or extremity weakness  Pt denies polydipsia, polyuria.  Viagra 100 mg not working - needs change  also with ongoing fatigue, and sleeping in the room today, but denies night time snoring, AM headaches, or other daytime somnolence  also with various muscle cramps - to legs and chest - despite gatorade and bananas, mild to mod , for several wks, nothing makes better  Problems Prior to Update: 1)  Contusion, Leg  (ICD-924.5) 2)  Sciatica, Left  (ICD-724.3) 3)  Disc Disease, Lumbar  (ICD-722.52) 4)  Other&unspecified Diseases The Oral Soft Tissues  (ICD-528.9) 5)  Chronic Pain Syndrome   (ICD-338.4) 6)  Hyperlipidemia-mixed  (ICD-272.4) 7)  Otitis Externa, Left  (ICD-380.10) 8)  Coronary Artery Disease  (ICD-414.00) 9)  Hypertension  (ICD-401.9) 10)  Chest Pain  (ICD-786.50) 11)  Depression  (ICD-311) 12)  Fatigue  (ICD-780.79) 13)  Special Screening Malig Neoplasms Other Sites  (ICD-V76.49) 14)  Uri  (ICD-465.9) 15)  Ankle Pain, Right  (ICD-719.47) 16)  Shoulder Pain, Bilateral  (ICD-719.41) 17)  Urethral Stricture  (ICD-598.9) 18)  Preventive Health Care  (ICD-V70.0) 19)  Nephrolithiasis, Hx of  (ICD-V13.01) 20)  Allergic Rhinitis  (ICD-477.9) 21)  Degenerative Joint Disease  (ICD-715.90)  Medications Prior to Update: 1)  Gabapentin 800 Mg Tabs (Gabapentin) .[name].. 1 By Mouth Three Times A Day 2)  Atenolol 25 Mg Tabs (Atenolol) .[name].. 1po Once Daily 3)  Oxycodone Hcl 15 Mg Tabs (Oxycodone Hcl) .[name].. 1 By Mouth Four Times Per Day - To Fill Sept 27, 2011 4)  Cymbalta 60 Mg Cpep (Duloxetine Hcl) .[name].. 1 By Mouth Once Daily 5)  Mirtazapine 30 Mg Tabs (Mirtazapine) .[name].. 1 By Mouth At Bedtime 6)  Effient 10 Mg Tabs (Prasugrel Hcl) .... Take Three Tablets The First Day Then One Tablet Daily 7)  Benazepril Hcl 20 Mg Tabs (Benazepril Hcl) .[name].. 1po Once Daily 8)  Flexeril  5 Mg Tabs (Cyclobenzaprine Hcl) .... Generic - 1 By Mouth Three Times A Day As Needed 9)  Crestor 10 Mg Tabs (Rosuvastatin Calcium) .[name].. 1po Once Daily 10)  Amlodipine Besylate 5 Mg Tabs (Amlodipine Besylate) .[name].. 1p O Once Daily 11)  Zolpidem Tartrate 10 Mg Tabs (Zolpidem Tartrate) .... 2 By Mouth At Bedtime As Needed 12)  Nitrofurantoin Macrocrystal 50 Mg Caps (Nitrofurantoin Macrocrystal) .[name].. 1 By Mouth Once Daily 13)  Nasonex 50 Mcg/act Susp (Mometasone Furoate) .... 2 Spray/side Per Day 14)  Lidoderm 5 % Ptch (Lidocaine) .[name].. 1 Patch Three Times A Day As Needed Pain 15)  Voltaren 1 % Gel (Diclofenac Sodium) .... Use Asd Three Times A Day 16)  Nitrostat 0.3 Mg Subl (Nitroglycerin) .... Use Asd As Needed 17)   Aspir-Low 81 Mg Tbec (Aspirin) .... One Tablet Daily 18)  Indomethacin 25 Mg Caps (Indomethacin) .... 2 By Mouth Daily 19)  Viagra 100 Mg Tabs (Sildenafil Citrate) .... As Needed 20)  Prednisone 10 Mg Tabs (Prednisone) .... 4po Qd For 3days, Then 3po Qd For 3days, Then 2po Qd For 3days, Then 1po Qd For 3 Days, Then Stop  Current Medications (verified): 1)  Gabapentin 800 Mg Tabs (Gabapentin) .[name].. 1 By Mouth Three Times A Day 2)  Atenolol 25 Mg Tabs (Atenolol) .[name].. 1po Once Daily 3)  Oxycodone Hcl 15 Mg Tabs (Oxycodone Hcl) .[name].. 1 By Mouth Four Times Per Day - To Fill Sept 27, 2011 4)  Bupropion Hcl 300 Mg Xr24h-Tab (Bupropion Hcl) .[name].. 1po Once Daily 5)  Mirtazapine 30 Mg Tabs (Mirtazapine) .[name].. 1 By Mouth At Bedtime 6)  Effient 10 Mg Tabs (Prasugrel Hcl) .... Take Three Tablets The First Day Then One Tablet Daily 7)  Benazepril Hcl 20 Mg Tabs (Benazepril Hcl) .[name].. 1po Once Daily 8)  Flexeril 5 Mg Tabs (Cyclobenzaprine Hcl) .... Generic - 1 By Mouth Three Times A Day As Needed 9)  Lipitor 20 Mg Tabs (Atorvastatin Calcium) .[name].. 1po Once Daily - Generic 10)  Amlodipine Besylate 5 Mg Tabs (Amlodipine Besylate) .[name].. 1p O Once Daily 11)  Zolpidem Tartrate 10 Mg Tabs (Zolpidem Tartrate) .... 2 By Mouth At Bedtime As Needed 12)  Nitrofurantoin Macrocrystal 50 Mg Caps (Nitrofurantoin Macrocrystal) .[name].. 1 By Mouth Once Daily 13)  Fluticasone Propionate 50 Mcg/act Susp (Fluticasone Propionate) .... 2 Spray/side Once Daily 14)  Lidoderm 5 % Ptch (Lidocaine) .[name].. 1 Patch Three Times A Day As Needed Pain 15)  Voltaren 1 % Gel (Diclofenac Sodium) .... Use Asd Three Times A Day 16)  Nitrostat 0.3 Mg Subl (Nitroglycerin) .... Use Asd As Needed 17)  Aspir-Low 81 Mg Tbec (Aspirin) .... One Tablet Daily 18)  Indomethacin 25 Mg Caps (Indomethacin) .... 2 By Mouth Daily 19)  Levitra 20 Mg Tabs (Vardenafil Hcl) .[name].. 1po Every Other Day As Needed 20)  Fluoxetine Hcl 20 Mg Caps (Fluoxetine Hcl) .[name].. 1po Once  Daily  Allergies (verified): No Known Drug Allergies  Past History:  Past Surgical History: Last updated: 09/17/2009 Tonsillectomy Rotator cuff repair - bilat s/p knee replacement 2008 s/p bilat wrist surgury s/p urethral dilations , mult in 2009, 2010 Carpal tunnel release s/p right knee arthroscopy 03/2005 and mult prior s/p umbilical hernia s/p left knee arthroscopy june 2002 and mult prior s/p right shoulder rotater cuff surgury apr 2011 - Dr [name] Lighter  Social History: Last updated: 07/15/2009 Divorced, moved here from [place].  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   Risk Factors: Smoking Status: quit (06/24/2009)  Past Medical History: 1. CORONARY ARTERY DISEASE (ICD-414.00): Has had multiple PCIs, reports he never had an MI (just angina).  12/08 had Taxus DES to mLAD, Endeavor DES to [facility] and to dCFX.  11/09 had BMS x 2 to mid RCA.  1/09 had Xience DES to dCFX, mCFX, and pCFX.  Lexiscan myoview (3/11): EF 50%, apical hypokinesis, possible small inferoapical infarct with no ischemia.  Low risk.  2.  HYPERTENSION (ICD-401.9) 3. DEPRESSION (ICD-311) 4. URETHRAL STRICTURE (ICD-598.9) 5. NEPHROLITHIASIS, HX OF (ICD-V13.01) 6. ALLERGIC RHINITIS (ICD-477.9) 7. DEGENERATIVE JOINT DISEASE (ICD-715.90): s/p bilateral TKRs 8. OSA: Not using CPAP very often.  9. Hyperlipidemia 10. Obesity 11. Echo (3/11): EF 55-60%, normal wall motion, grade I diastolic dysfunction, normal valves, normal RV.  12. Clopidogrel resistant 13. Rotator cuff repair 4/11 Lumbar Disc disease glucose intolerance  Review of Systems       all otherwise negative per pt -    Physical Exam  General:  alert and overweight-appearing.   Head:  normocephalic and atraumatic.   Eyes:  vision grossly intact, pupils equal, and pupils round.    Ears:  R ear normal and L ear normal.   Nose:  no external deformity and no nasal discharge.   Mouth:  no gingival abnormalities and pharynx pink and moist.   Neck:  supple and no masses.   Lungs:  normal respiratory effort and normal breath sounds.   Heart:  normal rate and regular rhythm.   Abdomen:  soft, non-tender, and normal bowel sounds.   Msk:  no flank tender , but has some left periscapular tender/spasm Extremities:  no edema, no erythema  Neurologic:  strength normal in all extremities and gait normal.   Skin:  color normal and no rashes.   Psych:  dysphoric affect and moderately anxious.     Impression & Recommendations:  Problem # 1:  DYSURIA (ICD-788.1)  His updated medication list for this problem includes:    Nitrofurantoin Macrocrystal 50 Mg Caps (Nitrofurantoin macrocrystal) .[name]... 1 by mouth once daily to re-start the med, chelkc urine studies,  ? uti - consider cipro course  Orders: TLB-Udip w/ Micro (81001-URINE) T-Culture, Urine ([id number])  Problem # 2:  DEPRESSION (ICD-311)  His updated medication list for this problem includes:    Bupropion Hcl 300 Mg Xr24h-tab (Bupropion hcl) .[name]... 1po once daily    Mirtazapine 30 Mg Tabs (Mirtazapine) .[name]... 1 by mouth at bedtime    Fluoxetine Hcl 20 Mg Caps (Fluoxetine hcl) .[name]... 1po once daily med adjsted, o/w stable,  Discussed treatment options, including trial of antidpressant medication.Patient agrees to call if any worsening of symptoms or thoughts of doing harm arise. Verified that the patient has no suicidal ideation at this time.   Problem # 3:  ALLERGIC RHINITIS (ICD-477.9)  His updated medication list for this problem includes:    Fluticasone Propionate 50 Mcg/act Susp (Fluticasone propionate) .[name]... 2 spray/side once daily change to fluticason susp due to cost -o/w stable overall by hx and exam, ok to continue meds/tx   Problem # 4:  HYPERLIPIDEMIA-MIXED (ICD-272.4)  His updated medication list for  this problem includes:    Lipitor 20 Mg Tabs (Atorvastatin calcium) .[name]... 1po once daily - generic to change crestor to lipitor due to cost, o/w stable overall by hx and exam, ok to continue meds/tx  Labs Reviewed: SGOT: 27 (07/15/2009)   SGPT: 23 (07/15/2009)   HDL:41.70 (07/15/2009)  LDL:45 (07/15/2009)  Chol:98 (07/15/2009)  Trig:59.0 (07/15/2009)  Orders: TLB-Lipid Panel (80061-LIPID)  Problem # 5:  ERECTILE DYSFUNCTION, ORGANIC (ICD-607.84)  His updated medication list for this problem includes:    Levitra 20 Mg Tabs (Vardenafil hcl) .[name]... 1po every other day as needed treat as above, f/u any worsening signs or symptoms , recent testosterone and PSA check done per urology  Problem # 6:  GLUCOSE INTOLERANCE (ICD-271.3)  recent labs 2011 reviewed with pt - with midl elev glc 113 -121 - to check a1c  Orders: TLB-A1C / Hgb A1C (Glycohemoglobin) (83036-A1C)  Problem # 7:  FATIGUE (ICD-780.79) exam benign, to check labs below; follow with expectant management  - likely due to mult co-morbidities, denies osa symtpoms Orders: TLB-BMP (Basic Metabolic Panel-BMET) ([id number]-[id number]) TLB-CBC Platelet - w/Differential (85025-CBCD) TLB-Hepatic/Liver Function Pnl (80076-HEPATIC) TLB-TSH (Thyroid Stimulating Hormone) (84443-TSH)  Complete Medication List: 1)  Gabapentin 800 Mg Tabs (Gabapentin) .[name].. 1 by mouth three times a day 2)  Atenolol 25 Mg Tabs (Atenolol) .[name].. 1po once daily 3)  Oxycodone Hcl 15 Mg Tabs (Oxycodone hcl) .[name].. 1 by mouth four times per day - to fill sept 27, 2011 4)  Bupropion Hcl 300 Mg Xr24h-tab (Bupropion hcl) .[name].. 1po once daily 5)  Mirtazapine 30 Mg Tabs (Mirtazapine) .[name].. 1 by mouth at bedtime 6)  Effient 10 Mg Tabs (Prasugrel hcl) .... Take three tablets the first day then one tablet daily 7)  Benazepril Hcl 20 Mg Tabs (Benazepril hcl) .[name].. 1po once daily 8)  Flexeril 5 Mg Tabs (Cyclobenzaprine hcl) .... Generic - 1 by mouth three times a day as needed 9)   Lipitor 20 Mg Tabs (Atorvastatin calcium) .[name].. 1po once daily - generic 10)  Amlodipine Besylate 5 Mg Tabs (Amlodipine besylate) .[name].. 1p o once daily 11)  Zolpidem Tartrate 10 Mg Tabs (Zolpidem tartrate) .... 2 by mouth at bedtime as needed 12)  Nitrofurantoin Macrocrystal 50 Mg Caps (Nitrofurantoin macrocrystal) .[name].. 1 by mouth once daily 13)  Fluticasone Propionate 50 Mcg/act Susp (Fluticasone propionate) .... 2 spray/side once daily 14)  Lidoderm 5 % Ptch (Lidocaine) .[name].. 1 patch three times a day as needed pain 15)  Voltaren 1 % Gel (Diclofenac sodium) .... Use asd three times a day 16)  Nitrostat 0.3 Mg Subl (Nitroglycerin) .... Use asd as needed 17)  Aspir-low 81 Mg Tbec (Aspirin) .... One tablet daily 18)  Indomethacin 25 Mg Caps (Indomethacin) .... 2 by mouth daily 19)  Levitra 20 Mg Tabs (Vardenafil hcl) .[name].. 1po every other day as needed 20)  Fluoxetine Hcl 20 Mg Caps (Fluoxetine hcl) .[name].. 1po once daily  Patient Instructions: 1)  Please take all new medications as prescribed 2)  Continue all previous medications as before this visit  3)  Please go to the Lab in the basement for your blood and/or urine tests today 4)  Please call the number on the [facility] for results of your testing  5)  Please schedule a follow-up appointment in 6 months or sooner if needed Prescriptions: LEVITRA 20 MG TABS (VARDENAFIL HCL) 1po every other day as needed  #5 x 11   Entered and Authorized by:   [name] MD   Signed by:   [name] MD on 05/30/2010   Method used:   Electronically to        [facility]. #[id number]* (retail)  [place], [place]  [address]       Ph: [phone]       Fax: [phone]   RxID:   [id number] LIPITOR 20 MG TABS (ATORVASTATIN CALCIUM) 1po once daily - generic  #90 x 3   Entered and Authorized by:   [name] MD   Signed by:   [name] MD on 05/30/2010   Method used:   Electronically to        [facility].  #[id number]* (retail)       [place], [place]  [address]       Ph: [phone]       Fax: [phone]   RxID:   [id number] FLUTICASONE PROPIONATE 50 MCG/ACT SUSP (FLUTICASONE PROPIONATE) 2 spray/side once daily  #1 x 11   Entered and Authorized by:   [name] MD   Signed by:   [name] MD on 05/30/2010   Method used:   Electronically to        [facility]. #[id number]* (retail)       [place], [place]  [address]       Ph: [phone]       Fax: [phone]   RxID:   [id number] BUPROPION HCL 300 MG XR24H-TAB (BUPROPION HCL) 1po once daily  #90 x 3   Entered and Authorized by:   [name] MD   Signed by:   [name] MD on 05/30/2010   Method used:   Electronically to        [facility]. #[id number]* (retail)       [place], [place]  [address]       Ph: [phone]       Fax: [phone]   RxID:   [id number] FLUOXETINE HCL 20 MG CAPS (FLUOXETINE HCL) 1po once daily  #90 x 3   Entered and Authorized by:   [name] MD   Signed by:   [name] MD on 05/30/2010   Method used:   Electronically to        [facility]. #[id number]* (retail)       [place], [place]  [address]       Ph: [phone]       Fax: [phone]   RxID:   [id number] NITROFURANTOIN MACROCRYSTAL 50 MG CAPS (NITROFURANTOIN MACROCRYSTAL) 1 by mouth once daily  #90 x 3   Entered and Authorized by:   [name] MD   Signed by:   [name] MD on 05/30/2010   Method used:   Electronically to        [facility]. #[id number]* (retail)       [place], [place]  [address]       Ph: [phone]       Fax: [phone]   RxID:   [id number]    Orders Added: 1)  TLB-Udip w/ Micro [81001-URINE] 2)  T-Culture, Urine [[id number]] 3)  TLB-A1C / Hgb A1C (Glycohemoglobin) [83036-A1C] 4)  TLB-Lipid Panel [80061-LIPID] 5)  TLB-BMP (Basic Metabolic Panel-BMET)  [[id number]] 6)  TLB-CBC Platelet - w/Differential [85025-CBCD] 7)  TLB-Hepatic/Liver Function Pnl [80076-HEPATIC] 8)  TLB-TSH (Thyroid  Stimulating Hormone) [84443-TSH] 9)  Est. Patient Level IV [70350]

## 2010-06-23 NOTE — Progress Notes (Signed)
Summary: NEEDS OV   Phone Note Call from Patient Call back at [facility] Phone [phone]   Summary of Call: Pt left vm req apt w/MD. C/o sciatic pain. Please help schedule w/ Dr Jenny Reichmann if avail or another avail MD if pt insists on office visit today. [facility] Initial call taken by: Charlsie Quest, Rachel,  April 18, 2010 11:08 AM  Follow-up for Phone Call        Appt made for 11/29 w/Dr Jenny Reichmann Follow-up by: Denice Paradise,  April 18, 2010 11:21 AM

## 2010-06-23 NOTE — Assessment & Plan Note (Signed)
Summary: per check out/sf   Visit Type:  Follow-up Primary Provider:  Dr. Jenny Reichmann   History of Present Illness: 60 yo with history of CAD s/p multiple PCIs and obesity presents for followup.  He had some exertional chest pain earlier this year during his move to [place] from [place] so we did a [place].  This was a low risk study showing a probable small inferoapical infarct with no ischemia.  EF was preserved by echo and myoview.  He has been doing well.  No further exertional chest pressure.  He gets occasional pressure in his chest that now occurs once a week.  He typically notices it at rest (never with exertion recently).  He will take a nitroglycerin with complete resolution.  He is trying to get more active and has lost about 2 lbs since last appointment.  He is able to walk around the block for exercise without chest pain or shortness of breath but is limited by his knee and foot arthritis.  Some dyspnea with steps.  He plans to start swimming for exercise.  He is trying to watch his diet better as well.  He is trying to use his CPAP more and is sleeping better at night.   Labs (2/11): K 4.9, creatinine 1.1, HDL 42, LDL 45, TG 59, LFTs normal Labs (3/11): VerifyNow clopidogrel resistance testing - 31% inhibition, 241 PRU (low level of platelet inhibition with Plavix).   Current Medications (verified): 1)  Isosorbide Mononitrate Cr 60 Mg Xr24h-Tab (Isosorbide Mononitrate) .... One Tablet Daily 2)  Gabapentin 800 Mg Tabs (Gabapentin) .[name].. 1 By Mouth Three Times A Day 3)  Atenolol 25 Mg Tabs (Atenolol) .[name].. 1po Once Daily 4)  Oxycodone Hcl 15 Mg Tabs (Oxycodone Hcl) .[name].. 1 By Mouth Four Times Per Day - To Fill Aug 19, 2009 5)  Cymbalta 60 Mg Cpep (Duloxetine Hcl) .[name].. 1 By Mouth Once Daily 6)  Mirtazapine 30 Mg Tabs (Mirtazapine) .[name].. 1 By Mouth At Bedtime 7)  Plavix 75 Mg Tabs (Clopidogrel Bisulfate) .[name].. 1po Once Daily 8)  Benazepril Hcl 20 Mg Tabs (Benazepril Hcl) .[name].. 1po Once  Daily 9)  Skelaxin 800 Mg Tabs (Metaxalone) .[name].. 1 By Mouth Four Times Per Day 10)  Meloxicam 15 Mg Tabs (Meloxicam) .[name].. 1po Two Times A Day 11)  Crestor 10 Mg Tabs (Rosuvastatin Calcium) .[name].. 1po Once Daily 12)  Amlodipine Besylate 5 Mg Tabs (Amlodipine Besylate) .[name].. 1p O Once Daily 13)  Zolpidem Tartrate 10 Mg Tabs (Zolpidem Tartrate) .... 2 By Mouth At Bedtime As Needed 14)  Nitrofurantoin Macrocrystal 50 Mg Caps (Nitrofurantoin Macrocrystal) .[name].. 1 By Mouth Once Daily 15)  Nasonex 50 Mcg/act Susp (Mometasone Furoate) .... 2 Spray/side Per Day 16)  Lidoderm 5 % Ptch (Lidocaine) .[name].. 1 Patch Three Times A Day As Needed Pain 17)  Voltaren 1 % Gel (Diclofenac Sodium) .... Use Asd Three Times A Day 18)  Nitrostat 0.3 Mg Subl (Nitroglycerin) .... Use Asd As Needed 19)  Ecotrin 325 Mg Tbec (Aspirin) .[name].. 1po Once Daily  Allergies (verified): No Known Drug Allergies  Past History:  Past Medical History: 1. CORONARY ARTERY DISEASE (ICD-414.00): Has had multiple PCIs, reports he never had an MI (just angina).  12/08 had Taxus DES to mLAD, Endeavor DES to [facility] and to dCFX.  11/09 had BMS x 2 to mid RCA.  1/09 had Xience DES to dCFX, mCFX, and pCFX.  Lexiscan myoview (3/11): EF 50%, apical hypokinesis, possible small inferoapical infarct with no ischemia.  Low risk.  2.  HYPERTENSION (ICD-401.9) 3. DEPRESSION (ICD-311) 4. URETHRAL STRICTURE (ICD-598.9) 5. NEPHROLITHIASIS, HX OF (ICD-V13.01) 6. ALLERGIC RHINITIS (ICD-477.9) 7. DEGENERATIVE JOINT DISEASE (ICD-715.90): s/p bilateral TKRs 8. OSA: Not using CPAP very often.  9. Hyperlipidemia 10. Obesity 11. Echo (3/11): EF 55-60%, normal wall motion, grade I diastolic dysfunction, normal valves, normal RV.  12. Clopidogrel resistant  Family History: Reviewed history from 06/28/2009 and no changes required. mother with met cancer, unknown primary, DJD, heart disease, depression, HTN, breast cancer father with DM, heart disease/CABG,  skin  cancers, HTN, elev chol, and prostate cancer grandparent wih DJD, depression, CAD, HTN brother x 2 with HTN, depression  Social History: Reviewed history from 07/15/2009 and no changes required. Divorced, moved here from [place].  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   Review of Systems       All systems reviewed and negative except as per HPI.   Vital Signs:  Patient profile:   60 year old male Height:      72 inches Weight:      368 pounds BMI:     50.09 Pulse rate:   56 / minute BP sitting:   138 / 76  (left arm)  Vitals Entered By: Margaretmary Bayley CMA (August 10, 2009 9:49 AM)  Physical Exam  General:  Well developed, well nourished, in no acute distress.  Morbidly obese.  Neck:  Neck thick, no JVD. No masses, thyromegaly or abnormal cervical nodes. Lungs:  Clear bilaterally to auscultation and percussion. Heart:  Non-displaced PMI, chest non-tender; regular rate and rhythm, S1, S2 without murmurs, rubs or gallops. Carotid upstroke normal, no bruit. Pedals normal pulses. Trace ankle edema.  Abdomen:  Bowel sounds positive; abdomen soft and non-tender without masses, organomegaly, or hernias noted. No hepatosplenomegaly.  Obese.  Extremities:  No clubbing or cyanosis. Neurologic:  Alert and oriented x 3. Psych:  Normal affect.   Impression & Recommendations:  Problem # 1:  CORONARY ARTERY DISEASE (ICD-414.00) Patient has CAD s/p multiple PCIs.  He has the cards for a total of 8 stents, most recently in 11/09.  Given exertional chest pain earlier in the year, I did a Lexiscan myoview that was low risk with probable small inferoapical infarct and no ischemia.  EF preserved on echo and myoview.  He is not having exertional chest pain now, only some mild occasional atypical CP (though it is actually relieved by NTG).   Additionally, platelet inhibition with clopidogrel is inadequate. - Continue ASA, ACEI, atenolol - Increase Imdur to 90 mg daily.  - Given multiple stents and higher risk of stent thrombosis, since the patient has poor platelet inhibition with clopidogrel, I will change him to prasugrel.    Problem # 2:  TGGYIRSWNIOEVO-JJKKX (FGH-829.4) Excellent lipids.  Continue Crestor.   Problem # 3:  OBESITY Needs weight loss.  We talked extensively about diet and exercise.  With low risk myoview, I would like him to start a formal exercise program. He has liked to swim in the past.  I think that this would be reasonable.    Patient Instructions: 1)  Your physician has recommended you make the following change in your medication:  2)  Stop Plavix 3)  Start Effient --the first day take 11m (three 159mtablets) then take one tablet daily--DECREASE ASPIRIN TO 81MG  WHEN YOU START EFFIENT 4)  Increase Imdur(isosorbide)to 56m daily--this will be one and one-half of a 621mtablet 5)  Your physician wants you to follow-up in: 4 months with Dr McAundra Dubin  You will receive a reminder letter in the mail two months in advance. If you don't receive a letter, please call our office to schedule the follow-up appointment. Prescriptions: ISOSORBIDE MONONITRATE CR 60 MG XR24H-TAB (ISOSORBIDE MONONITRATE) one and one-half  tablet daily  #50 x 6   Entered by:   AnDesiree LucyRN, BSN   Authorized by:   DaLoralie ChampagneMD   Signed by:   AnDesiree LucyRN, BSN on 08/10/2009   Method used:   Electronically to        [id number](retail)       [address]  [address]     Ph: [phone]     Fax: [phone] RxID:   [id number] MONONITRATE CR 30 MG XR24H-TAB (ISOSORBIDE MONONITRATE) take one tablet daily  #30 x 6   Entered by:   AnDesiree LucyRN, BSN   Authorized by:   DaLoralie ChampagneMD   Signed by:   AnDesiree LucyRN, BSN on 08/10/2009   Method used:    Electronically to        [id number](retail)       [address]  [address]     Ph: [phone]     Fax: [phone] RxID:   [id number] 10 MG TABS (PRASUGREL HCL) take three tablets the first day then one tablet daily  #30 x 3   Entered by:   AnDesiree LucyRN, BSN   Authorized by:   DaLoralie ChampagneMD   Signed by:   AnDesiree LucyRN, BSN on 08/10/2009   Method used:   Electronically to        [id number](retail)       [address]  [id number]     Ph: [phone]     Fax: [phone] RxID:   [id number]

## 2010-06-23 NOTE — Assessment & Plan Note (Signed)
Summary: Cardiology Nuclear Study  Nuclear Med Background Indications for Stress Test: Evaluation for Ischemia, Stent Patency   History: Heart Catheterization, Stents  History Comments: Mult. Stents, Last 11/09 Stent-RCA  Symptoms: Chest Pain, Chest Pain with Exertion, Chest Tightness, Chest Tightness with Exertion, Diaphoresis, Dizziness, DOE, Fatigue, Palpitations, Rapid HR  Symptoms Comments: CP>jaw. Last episode of CP:3 weeks ago.   Nuclear Pre-Procedure Cardiac Risk Factors: Family History - CAD, Hypertension, Lipids, Obesity, Smoker Caffeine/Decaff Intake: None NPO After: 8:00 AM Lungs: Clear.  O2 Sat 95% on RA. IV 0.9% NS with Angio Cath: 22g     IV Site: (R) Forearm IV Started by: Irven Baltimore RN Chest Size (in) 62     Height (in): 72 Weight (lb): 368 BMI: 50.09 Tech Comments: Smoker-smokes 1 cigar a week.  Nuclear Med Study 1 or 2 day study:  2 day     Stress Test Type:  Carlton Adam Reading MD:  Jenkins Rouge, MD     Referring MD:  Loralie Champagne, MD Resting Radionuclide:  Technetium 79mTetrofosmin     Resting Radionuclide Dose:  33.0 mCi  Stress Radionuclide:  Technetium 965metrofosmin     Stress Radionuclide Dose:  33.0 mCi   Stress Protocol   Lexiscan: 0.4 mg   Stress Test Technologist:  ShValetta FullerMA-N     Nuclear Technologist:  WaMariann Lastereal RT-N  Rest Procedure  Myocardial perfusion imaging was performed at rest 45 minutes following the intravenous administration of Myoview Technetium 9935mtrofosmin.  Stress Procedure  The patient received IV Lexiscan 0.4 mg over 15-seconds.  Myoview injected at 30-seconds.  There were no significant changes with lexiscan. other than a hypotensive response.  Quantitative spect images were obtained after a 45 minute delay.  QPS Raw Data Images:  Normal; no motion artifact; normal heart/lung ratio. Stress Images:  NI: Uniform and normal uptake of tracer in all myocardial segments. Rest Images:  Normal homogeneous uptake  in all areas of the myocardium. Subtraction (SDS):  Normal Transient Ischemic Dilatation:  .81  (Normal <1.22)  Lung/Heart Ratio:  .34  (Normal <0.45)  Quantitative Gated Spect Images QGS EDV:  152 ml QGS ESV:  76 ml QGS EF:  50 % QGS cine images:  Apical hypokinesis  Findings Low risk nuclear study  Evidence for inferior infarct  Evidence for LV Dysfunction LV Dysfunction    Overall Impression  Exercise Capacity: Lexiscan BP Response: Normal blood pressure response. Clinical Symptoms: Dyspnea ECG Impression: No significant ST segment change suggestive of ischemia. Overall Impression: Possible small inferoapical infarct.  RV enlargement.  No ischemia  Appended Document: Cardiology Nuclear Study possible small inferoapical infarct, no ischemia.  Low risk.   Appended Document: Cardiology Nuclear Study LMVM  Appended Document: Cardiology Nuclear Study discussed results by telephone with patient

## 2010-06-23 NOTE — Letter (Signed)
Summary: [facility]   Imported By: Bubba Hales 01/31/2010 10:29:52  _____________________________________________________________________  External Attachment:    Type:   Image     Comment:   External Document

## 2010-06-23 NOTE — Assessment & Plan Note (Signed)
Summary: 3 MO ROV /NWS  #   Vital Signs:  Patient profile:   60 year old [name] Height:      72 inches Weight:      366.50 pounds BMI:     49.89 O2 Sat:      97 % on Room air Temp:     97.9 degrees F oral Pulse rate:   57 / minute BP sitting:   130 / 74  (left arm) Cuff size:   large  Vitals Entered ByShirlean Mylar Ewing (September 17, 2009 1:51 PM)  O2 Flow:  Room air CC: 3 Month ROV/RE   Primary Care Provider:  Dr. Jenny Reichmann  CC:  3 Month ROV/RE.  History of Present Illness: here to f/u; overall doing ok, has ongoing pain with depressive symptoms but no worse and needs med refills, no worsening depressive symtpoms, suicidal idaeation, or panic.  Pt denies CP, sob, doe, wheezing, orthopnea, pnd, worsening LE edema, palps, dizziness or syncope  Pt denies new neuro symptoms such as headache, facial or extremity weakness   Never did see urology and stil with symptomatic urethral stricture type symtpoms.  No other new comploaints.    Problems Prior to Update: 1)  Hyperlipidemia-mixed  (ICD-272.4) 2)  Otitis Externa, Left  (ICD-380.10) 3)  Coronary Artery Disease  (ICD-414.00) 4)  Hypertension  (ICD-401.9) 5)  Chest Pain  (ICD-786.50) 6)  Depression  (ICD-311) 7)  Fatigue  (ICD-780.79) 8)  Special Screening Malig Neoplasms Other Sites  (ICD-V76.49) 9)  Uri  (ICD-465.9) 10)  Ankle Pain, Right  (ICD-719.47) 11)  Shoulder Pain, Bilateral  (ICD-719.41) 12)  Urethral Stricture  (ICD-598.9) 13)  Preventive Health Care  (ICD-V70.0) 14)  Nephrolithiasis, Hx of  (ICD-V13.01) 15)  Allergic Rhinitis  (ICD-477.9) 16)  Degenerative Joint Disease  (ICD-715.90)  Medications Prior to Update: 1)  Isosorbide Mononitrate Cr 60 Mg Xr24h-Tab (Isosorbide Mononitrate) .... One and One-Half  Tablet Daily 2)  Gabapentin 800 Mg Tabs (Gabapentin) .[name].. 1 By Mouth Three Times A Day 3)  Atenolol 25 Mg Tabs (Atenolol) .[name].. 1po Once Daily 4)  Oxycodone Hcl 15 Mg Tabs (Oxycodone Hcl) .[name].. 1 By Mouth Four Times Per Day -  To Fill Aug 19, 2009 5)  Cymbalta 60 Mg Cpep (Duloxetine Hcl) .[name].. 1 By Mouth Once Daily 6)  Mirtazapine 30 Mg Tabs (Mirtazapine) .[name].. 1 By Mouth At Bedtime 7)  Effient 10 Mg Tabs (Prasugrel Hcl) .... Take Three Tablets The First Day Then One Tablet Daily 8)  Benazepril Hcl 20 Mg Tabs (Benazepril Hcl) .[name].. 1po Once Daily 9)  Skelaxin 800 Mg Tabs (Metaxalone) .[name].. 1 By Mouth Four Times Per Day 10)  Meloxicam 15 Mg Tabs (Meloxicam) .[name].. 1po Two Times A Day 11)  Crestor 10 Mg Tabs (Rosuvastatin Calcium) .[name].. 1po Once Daily 12)  Amlodipine Besylate 5 Mg Tabs (Amlodipine Besylate) .[name].. 1p O Once Daily 13)  Zolpidem Tartrate 10 Mg Tabs (Zolpidem Tartrate) .... 2 By Mouth At Bedtime As Needed 14)  Nitrofurantoin Macrocrystal 50 Mg Caps (Nitrofurantoin Macrocrystal) .[name].. 1 By Mouth Once Daily 15)  Nasonex 50 Mcg/act Susp (Mometasone Furoate) .... 2 Spray/side Per Day 16)  Lidoderm 5 % Ptch (Lidocaine) .[name].. 1 Patch Three Times A Day As Needed Pain 17)  Voltaren 1 % Gel (Diclofenac Sodium) .... Use Asd Three Times A Day 18)  Nitrostat 0.3 Mg Subl (Nitroglycerin) .... Use Asd As Needed 19)  Aspir-Low 81 Mg Tbec (Aspirin) .... One Tablet Daily  Current Medications (verified): 1)  Isosorbide Mononitrate Cr 60 Mg Xr24h-Tab (Isosorbide Mononitrate) .... One and One-Half  Tablet Daily 2)  Gabapentin 800 Mg Tabs (Gabapentin) .[name].. 1 By Mouth Three Times A Day 3)  Atenolol 25 Mg Tabs (Atenolol) .[name].. 1po Once Daily 4)  Oxycodone Hcl 15 Mg Tabs (Oxycodone Hcl) .[name].. 1 By Mouth Four Times Per Day - To Fill November 17, 2009 5)  Cymbalta 60 Mg Cpep (Duloxetine Hcl) .[name].. 1 By Mouth Once Daily 6)  Mirtazapine 30 Mg Tabs (Mirtazapine) .[name].. 1 By Mouth At Bedtime 7)  Effient 10 Mg Tabs (Prasugrel Hcl) .... Take Three Tablets The First Day Then One Tablet Daily 8)  Benazepril Hcl 20 Mg Tabs (Benazepril Hcl) .[name].. 1po Once Daily 9)  Skelaxin 800 Mg Tabs (Metaxalone) .[name].. 1 By Mouth Four Times Per Day 10)  Meloxicam 15 Mg  Tabs (Meloxicam) .[name].. 1po Two Times A Day 11)  Crestor 10 Mg Tabs (Rosuvastatin Calcium) .[name].. 1po Once Daily 12)  Amlodipine Besylate 5 Mg Tabs (Amlodipine Besylate) .[name].. 1p O Once Daily 13)  Zolpidem Tartrate 10 Mg Tabs (Zolpidem Tartrate) .... 2 By Mouth At Bedtime As Needed 14)  Nitrofurantoin Macrocrystal 50 Mg Caps (Nitrofurantoin Macrocrystal) .[name].. 1 By Mouth Once Daily 15)  Nasonex 50 Mcg/act Susp (Mometasone Furoate) .... 2 Spray/side Per Day 16)  Lidoderm 5 % Ptch (Lidocaine) .[name].. 1 Patch Three Times A Day As Needed Pain 17)  Voltaren 1 % Gel (Diclofenac Sodium) .... Use Asd Three Times A Day 18)  Nitrostat 0.3 Mg Subl (Nitroglycerin) .... Use Asd As Needed 19)  Aspir-Low 81 Mg Tbec (Aspirin) .... One Tablet Daily  Allergies (verified): No Known Drug Allergies  Past History:  Social History: Last updated: 07/15/2009 Divorced, moved here from [place].  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   Risk Factors: Smoking Status: quit (06/24/2009)  Past Medical History: Reviewed history from 08/10/2009 and no changes required. 1. CORONARY ARTERY DISEASE (ICD-414.00): Has had multiple PCIs, reports he never had an MI (just angina).  12/08 had Taxus DES to mLAD, Endeavor DES to [facility] and to dCFX.  11/09 had BMS x 2 to mid RCA.  1/09 had Xience DES to dCFX, mCFX, and pCFX.  Lexiscan myoview (3/11): EF 50%, apical hypokinesis, possible small inferoapical infarct with no ischemia.  Low risk.  2.  HYPERTENSION (ICD-401.9) 3. DEPRESSION (ICD-311) 4. URETHRAL STRICTURE (ICD-598.9) 5. NEPHROLITHIASIS, HX OF (ICD-V13.01) 6. ALLERGIC RHINITIS (ICD-477.9) 7. DEGENERATIVE JOINT DISEASE (ICD-715.90): s/p bilateral TKRs 8. OSA: Not using CPAP very often.  9. Hyperlipidemia 10. Obesity 11. Echo (3/11): EF 55-60%, normal wall motion,  grade I diastolic dysfunction, normal valves, normal RV.  12. Clopidogrel resistant  Past Surgical History: Tonsillectomy Rotator cuff repair - bilat s/p knee replacement 2008 s/p bilat wrist surgury s/p urethral dilations , mult in 2009, 2010 Carpal tunnel release s/p right knee arthroscopy 03/2005 and mult prior s/p umbilical hernia s/p left knee arthroscopy june 2002 and mult prior s/p right shoulder rotater cuff surgury apr 2011 - Dr [name] Lighter  Review of Systems       all otherwise negative per pt -    Physical Exam  General:  alert and overweight-appearing.   Head:  normocephalic and atraumatic.   Eyes:  vision grossly intact, pupils equal, and pupils round.   Ears:  R ear normal and  L ear normal.   Nose:  no external deformity and no nasal discharge.   Mouth:  no gingival abnormalities and pharynx pink and moist.   Neck:  supple and no masses.   Lungs:  normal respiratory effort and normal breath sounds.   Heart:  normal rate and regular rhythm.   Abdomen:  soft, non-tender, and normal bowel sounds.   Msk:  no active joint tenderness and no joint swelling.   Extremities:  no edema, no erythema    Impression & Recommendations:  Problem # 1:  DEGENERATIVE JOINT DISEASE (ICD-715.90)  His updated medication list for this problem includes:    Oxycodone Hcl 15 Mg Tabs (Oxycodone hcl) .[name]... 1 by mouth four times per day - to fill November 17, 2009    Meloxicam 15 Mg Tabs (Meloxicam) .[name]... 1po two times a day    Aspir-low 81 Mg Tbec (Aspirin) ..... One tablet daily with chronic pain - for refills today, o/w stable overall by hx and exam, ok to continue meds/tx as is   Problem # 2:  DEPRESSION (ICD-311)  His updated medication list for this problem includes:    Cymbalta 60 Mg Cpep (Duloxetine hcl) .[name]... 1 by mouth once daily    Mirtazapine 30 Mg Tabs (Mirtazapine) .[name]... 1 by mouth at bedtime stable overall by hx and exam, ok to continue meds/tx as is   Problem # 3:   HYPERTENSION (ICD-401.9)  His updated medication list for this problem includes:    Atenolol 25 Mg Tabs (Atenolol) .[name]... 1po once daily    Benazepril Hcl 20 Mg Tabs (Benazepril hcl) .[name]... 1po once daily    Amlodipine Besylate 5 Mg Tabs (Amlodipine besylate) .[name]... 1p o once daily  BP today: 130/74 Prior BP: 138/76 (08/10/2009)  Labs Reviewed: K+: 4.9 (07/15/2009) Creat: : 1.1 (07/15/2009)   Chol: 98 (07/15/2009)   HDL: 41.70 (07/15/2009)   LDL: 45 (07/15/2009)   TG: 59.0 (07/15/2009) stable overall by hx and exam, ok to continue meds/tx as is   Problem # 4:  URETHRAL STRICTURE (ICD-598.9)  to re-refer to urology as he has not been able to be seen  Orders: Urology Referral (Urology)  Complete Medication List: 1)  Isosorbide Mononitrate Cr 60 Mg Xr24h-tab (Isosorbide mononitrate) .... One and one-half  tablet daily 2)  Gabapentin 800 Mg Tabs (Gabapentin) .[name].. 1 by mouth three times a day 3)  Atenolol 25 Mg Tabs (Atenolol) .[name].. 1po once daily 4)  Oxycodone Hcl 15 Mg Tabs (Oxycodone hcl) .[name].. 1 by mouth four times per day - to fill November 17, 2009 5)  Cymbalta 60 Mg Cpep (Duloxetine hcl) .[name].. 1 by mouth once daily 6)  Mirtazapine 30 Mg Tabs (Mirtazapine) .[name].. 1 by mouth at bedtime 7)  Effient 10 Mg Tabs (Prasugrel hcl) .... Take three tablets the first day then one tablet daily 8)  Benazepril Hcl 20 Mg Tabs (Benazepril hcl) .[name].. 1po once daily 9)  Skelaxin 800 Mg Tabs (Metaxalone) .[name].. 1 by mouth four times per day 10)  Meloxicam 15 Mg Tabs (Meloxicam) .[name].. 1po two times a day 11)  Crestor 10 Mg Tabs (Rosuvastatin calcium) .[name].. 1po once daily 12)  Amlodipine Besylate 5 Mg Tabs (Amlodipine besylate) .[name].. 1p o once daily 13)  Zolpidem Tartrate 10 Mg Tabs (Zolpidem tartrate) .... 2 by mouth at bedtime as needed 14)  Nitrofurantoin Macrocrystal 50 Mg Caps (Nitrofurantoin macrocrystal) .[name].. 1 by mouth once daily 15)  Nasonex 50 Mcg/act Susp (Mometasone furoate) .... 2 spray/side per  day 16)   Lidoderm 5 % Ptch (Lidocaine) .[name].. 1 patch three times a day as needed pain 17)  Voltaren 1 % Gel (Diclofenac sodium) .... Use asd three times a day 18)  Nitrostat 0.3 Mg Subl (Nitroglycerin) .... Use asd as needed 19)  Aspir-low 81 Mg Tbec (Aspirin) .... One tablet daily  Patient Instructions: 1)  Continue all previous medications as before this visit  2)  You will be contacted about the referral(s) to: urology 3)  Please schedule a follow-up appointment in 3 months. Prescriptions: MIRTAZAPINE 30 MG TABS (MIRTAZAPINE) 1 by mouth at bedtime  #90 x 3   Entered and Authorized by:   [name] MD   Signed by:   [name] MD on 09/17/2009   Method used:   Print then Give to Patient   RxID:   [id number] OXYCODONE HCL 15 MG TABS (OXYCODONE HCL) 1 by mouth four times per day - to fill November 17, 2009  #120 x 0   Entered and Authorized by:   [name] MD   Signed by:   [name] MD on 09/17/2009   Method used:   Print then Give to Patient   RxID:   [id number] OXYCODONE HCL 15 MG TABS (OXYCODONE HCL) 1 by mouth four times per day - to fill Oct 18, 2009  #120 x 0   Entered and Authorized by:   [name] MD   Signed by:   [name] MD on 09/17/2009   Method used:   Print then Give to Patient   RxID:   [id number] OXYCODONE HCL 15 MG TABS (OXYCODONE HCL) 1 by mouth four times per day - to fill Sep 18, 2009  #120 x 0   Entered and Authorized by:   [name] MD   Signed by:   [name] MD on 09/17/2009   Method used:   Print then Give to Patient   RxID:   [id number]

## 2010-06-28 ENCOUNTER — Ambulatory Visit
Admission: RE | Admit: 2010-06-28 | Discharge: 2010-06-28 | Disposition: A | Discharge status: Home / Self Care | Payer: Medicare Other | Source: Ambulatory Visit | Attending: Physical Medicine & Rehabilitation | Admitting: Physical Medicine & Rehabilitation

## 2010-06-28 ENCOUNTER — Encounter: Payer: Self-pay | Admitting: Internal Medicine

## 2010-06-28 DIAGNOSIS — M545 Low back pain, unspecified: Secondary | ICD-10-CM

## 2010-07-05 ENCOUNTER — Telehealth: Payer: Self-pay | Admitting: Cardiology

## 2010-07-07 NOTE — Consult Note (Signed)
Summary: [facility]  [facility]   Imported By: Rise Patience 06/29/2010 14:06:05  _____________________________________________________________________  External Attachment:    Type:   Image     Comment:   External Document

## 2010-07-11 ENCOUNTER — Encounter: Payer: Self-pay | Admitting: Cardiology

## 2010-07-11 ENCOUNTER — Ambulatory Visit (INDEPENDENT_AMBULATORY_CARE_PROVIDER_SITE_OTHER): Payer: Medicare Other | Admitting: Cardiology

## 2010-07-11 DIAGNOSIS — I2510 Atherosclerotic heart disease of native coronary artery without angina pectoris: Secondary | ICD-10-CM

## 2010-07-13 NOTE — Letter (Signed)
[facility]: [facility]   Imported By: Phillis Knack 07/04/2010 12:16:23  _____________________________________________________________________  External Attachment:    Type:   Image     Comment:   External Document

## 2010-07-13 NOTE — Progress Notes (Signed)
Summary: refill request  Phone Note Refill Request Message from:  Patient on July 05, 2010 10:40 AM  pt requested refill of isosorbide   Method Requested: Telephone to Pharmacy Initial call taken by: Lorenda Hatchet,  July 05, 2010 10:41 AM Caller: Patient [phone] Reason for Call: Talk to Nurse Summary of Call: pt callin  Follow-up for Phone Call        [facility] for patient to call back regarding Imdur refill.   Awaiting callback Follow-up by: Doug Sou CMA,  July 05, 2010 4:35 PM

## 2010-07-13 NOTE — Progress Notes (Signed)
Summary: refill requestptout of med/req last week  Phone Note Call from Patient   Caller: Patient Reason for Call: Talk to Nurse Summary of Call: pt calling re refill of isosorbide? Initial call taken by: Lorenda Hatchet,  July 05, 2010 10:28 AM

## 2010-07-14 ENCOUNTER — Encounter: Payer: Medicare Other | Admitting: Physical Medicine & Rehabilitation

## 2010-07-14 ENCOUNTER — Encounter: Payer: Medicare Other | Attending: Physical Medicine & Rehabilitation

## 2010-07-14 DIAGNOSIS — IMO0002 Reserved for concepts with insufficient information to code with codable children: Secondary | ICD-10-CM | POA: Insufficient documentation

## 2010-07-19 NOTE — Assessment & Plan Note (Signed)
Summary: f101mrs per prt request-mb   Primary Provider:  Dr. JJenny Reichmann  History of Present Illness: 60yo with history of CAD s/p multiple PCIs and obesity presents for followup.  He has been doing well.  No further exertional chest pressure.  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  No significant exertional dyspnea but he is not very active due to his back pain.  He is planning on getting an epidural on Friday because of this.  He is still morbidly obese but has lost a pound since last appointment.  He now has a girlfriend and has been using Levitra.  I had asked him in the past to stop Imdur if he were planning on using this.  He did not stop Imdur but instead uses Imdur Monday-Thursday and Levitra only Saturday.  He understands the risks of the combination of Levitra with another nitrate.    Labs (2/11): K 4.9, creatinine 1.1, HDL 42, LDL 45, TG 59, LFTs normal Labs (3/11): VerifyNow clopidogrel resistance testing - 31% inhibition, 241 PRU (low level of platelet inhibition with Plavix).  Labs (1/12): LDL 51, HDL 39, K 4.5, creatinine 0.8, TSH normal  ECG: NSR at 57, normal  Current Medications (verified): 1)  Gabapentin 800 Mg Tabs (Gabapentin) ..[name]. 1 By Mouth Three Times A Day 2)  Atenolol 25 Mg Tabs (Atenolol) ..[name]. 1po Once Daily 3)  Oxycodone Hcl 15 Mg Tabs (Oxycodone Hcl) ..[name]. 1 By Mouth Five  Times Per Day As Needed 4)  Bupropion Hcl 300 Mg Xr24h-Tab (Bupropion Hcl) ..[name]. 1po Once Daily 5)  Mirtazapine 30 Mg Tabs (Mirtazapine) ..[name]. 1 By Mouth At Bedtime 6)  Effient 10 Mg Tabs (Prasugrel Hcl) .... One Tablet Daily 7)  Benazepril Hcl 20 Mg Tabs (Benazepril Hcl) ..[name]. 1po Once Daily 8)  Flexeril 5 Mg Tabs (Cyclobenzaprine Hcl) .... Generic - 1 By Mouth Three Times A Day As Needed 9)  Lipitor 20 Mg Tabs (Atorvastatin Calcium) ..[name]. 1po Once Daily - Generic 10)  Amlodipine Besylate 5 Mg Tabs (Amlodipine Besylate) ..[name]. 1p O Once  Daily 11)  Zolpidem Tartrate 10 Mg Tabs (Zolpidem Tartrate) .... 2 By Mouth At Bedtime As Needed - To Fill Jul 05, 2010 12)  Nitrofurantoin Macrocrystal 50 Mg Caps (Nitrofurantoin Macrocrystal) ..[name]. 1 By Mouth Once Daily 13)  Fluticasone Propionate 50 Mcg/act Susp (Fluticasone Propionate) .... 2 Spray/side Once Daily 14)  Lidoderm 5 % Ptch (Lidocaine) ..[name]. 1 Patch Three Times A Day As Needed Pain 15)  Voltaren 1 % Gel (Diclofenac Sodium) .... Use Asd Three Times A Day 16)  Nitrostat 0.3 Mg Subl (Nitroglycerin) .... Use Asd As Needed 17)  Aspir-Low 81 Mg Tbec (Aspirin) .... One Tablet Daily 18)  Indomethacin 25 Mg Caps (Indomethacin) .... 2 By Mouth Daily 19)  Levitra 20 Mg Tabs (Vardenafil Hcl) ..[name]. 1po Every Other Day As Needed 20)  Fluoxetine Hcl 20 Mg Caps (Fluoxetine Hcl) ..[name]. 1po Once Daily 21)  Isosorbide Mononitrate Cr 60 Mg Xr24h-Tab (Isosorbide Mononitrate) .... Take 1 1/2 Tablet By Mouth Daily  Allergies: No Known Drug Allergies  Past History:  Past Medical History: 1. CORONARY ARTERY DISEASE (ICD-414.00): Has had multiple PCIs, reports he never had an MI (just angina).  12/08 [facility] Taxus [facility] mLAD[facility].  11/09 had BMS x 2 to mid RCA.  1/09 had Xience DES to dCFX, mCFX, and pCFX.  Lexiscan myoview (3/11): EF 50%, apical hypokinesis,  possible small inferoapical infarct with no ischemia.  Low risk.  2.  HYPERTENSION (ICD-401.9) 3. DEPRESSION (ICD-311) 4. URETHRAL STRICTURE (ICD-598.9) 5. NEPHROLITHIASIS, HX OF (ICD-V13.01) 6. ALLERGIC RHINITIS (ICD-477.9) 7. DEGENERATIVE JOINT DISEASE (ICD-715.90): s/p bilateral TKRs 8. OSA: Not using CPAP very often.  9. Hyperlipidemia 10. Obesity 11. Echo (3/11): EF 55-60%, normal wall motion, grade I diastolic dysfunction, normal valves, normal RV.  12. Clopidogrel resistant 13. Rotator cuff repair 4/11 14. Lumbar disk disease with chronic low back pain.  15. glucose intolerance 16. Erectile  dysfunction  Family History: Reviewed history from 06/28/2009 and no changes required. mother with met cancer, unknown primary, DJD, heart disease, depression, HTN, breast cancer father with DM, heart disease/CABG,  skin cancers, HTN, elev chol, and prostate cancer grandparent wih DJD, depression, CAD, HTN brother x 2 with HTN, depression  Social History: Divorced, moved here from [place].  Played semi-pro football, used steroids 2 children - grown disabled since 2006 - due to ortho, heart, psych Former Smoker - rare cigar now Alcohol use-no - very occasionally Drug use-no work - Part time work as an Multimedia programmer, wrestling, and Recruitment consultant at [employer] reviewed and negative except as per HPI.   Vital Signs:  Patient profile:   60 year old male Height:      72 inches Weight:      370 pounds BMI:     50.36 Pulse rate:   57 / minute Resp:     16 per minute BP sitting:   143 / 78  (left arm)  Vitals Entered By: Burnett Kanaris (July 11, 2010 10:25 AM)  Physical Exam  General:  Well developed, well nourished, in no acute distress. Obese.  Neck:  Neck thick, no JVD. No masses, thyromegaly or abnormal cervical nodes. Lungs:  Clear bilaterally to auscultation and percussion. Heart:  Non-displaced PMI, chest non-tender; regular rate and rhythm, S1, S2 without murmurs, rubs or gallops. Carotid upstroke normal, no bruit.  Pedals normal pulses. 1+ ankle edema.  Abdomen:  Bowel sounds positive; abdomen soft and non-tender without masses, organomegaly, or hernias noted. No hepatosplenomegaly. Extremities:  No clubbing or cyanosis. Neurologic:  Alert and oriented x 3. Psych:  Normal affect.   Impression & Recommendations:  Problem # 1:  CORONARY ARTERY DISEASE (ICD-414.00) Patient has CAD s/p multiple PCIs.  He has the cards for a total of 8 stents, most recently in 11/09.  Lexiscan myoview in 2011 was low risk with  probable small inferoapical infarct and no ischemia.  EF preserved on echo and myoview.  No exertional chest pain.  Main limitation is back pain.  - Continue ASA, ACEI, atenolol, Effient, statin.  - He can stop Effient prior to epidural and restart it after the procedure.  If possible, would continue his ASA 81 mg daily through the procedure.   Problem # 2:  ERECTILE DYSFUNCTION, ORGANIC (ICD-607.84) Paitent is now using Levitra.  I had asked him to stop Imdur but he is scared to do this.  He is taking Imdur Monday-Thursday and using Levitra only Saturdays. This is not ideal but should be safe as long as he does not change the day he takes Levitra.  We talked extensively about this. He is aware of the risk.   Problem # 3:  HYPERLIPIDEMIA-MIXED (GDJ-242.4) Lipids at goal in 1/12 (LDL < 70).   Patient Instructions: 1)  I have given you the information for the  Effient patient assistance program. Please let me know if you are going to be unable to get Effient. Dr Aundra Dubin does not want you to run out of this.  Eliot Ford  [phone]  2)  Your physician wants you to follow-up in: 6 months with Dr Aundra Dubin.(AUGUST  2012)  You will receive a reminder letter in the mail two months in advance. If you don't receive a letter, please call our office to schedule the follow-up appointment.

## 2010-08-10 LAB — COMPREHENSIVE METABOLIC PANEL
ALT: 22 U/L (ref 0–53)
AST: 23 U/L (ref 0–37)
Albumin: 4.1 g/dL (ref 3.5–5.2)
CO2: 28 mEq/L (ref 19–32)
Chloride: 109 mEq/L (ref 96–112)
Glucose, Bld: 121 mg/dL — ABNORMAL HIGH (ref 70–99)
Sodium: 140 mEq/L (ref 135–145)
Total Bilirubin: 0.4 mg/dL (ref 0.3–1.2)
Total Protein: 6.6 g/dL (ref 6.0–8.3)

## 2010-08-10 LAB — CBC
HCT: 40.2 % (ref 39.0–52.0)
MCHC: 33.9 g/dL (ref 30.0–36.0)
MCV: 89.9 fL (ref 78.0–100.0)
RDW: 13.3 % (ref 11.5–15.5)

## 2010-08-10 LAB — DIFFERENTIAL
Basophils Absolute: 0 10*3/uL (ref 0.0–0.1)
Basophils Relative: 1 % (ref 0–1)
Eosinophils Absolute: 0.1 10*3/uL (ref 0.0–0.7)
Neutro Abs: 3.2 10*3/uL (ref 1.7–7.7)

## 2010-08-10 LAB — URINALYSIS, ROUTINE W REFLEX MICROSCOPIC
Hgb urine dipstick: NEGATIVE
Ketones, ur: NEGATIVE mg/dL
Nitrite: NEGATIVE
Specific Gravity, Urine: 1.022 (ref 1.005–1.030)
Urobilinogen, UA: 0.2 mg/dL (ref 0.0–1.0)
pH: 5 (ref 5.0–8.0)

## 2010-08-10 LAB — HEMOGLOBIN AND HEMATOCRIT, BLOOD: Hemoglobin: 12.2 g/dL — ABNORMAL LOW (ref 13.0–17.0)

## 2010-08-15 LAB — PLATELET INHIBITION P2Y12
Platelet Function  P2Y12: 241 [PRU] (ref 194–418)
Platelet Function Baseline: 350 [PRU] (ref 194–418)

## 2010-08-19 ENCOUNTER — Ambulatory Visit: Payer: Medicare Other | Admitting: Physical Medicine & Rehabilitation

## 2010-08-19 ENCOUNTER — Other Ambulatory Visit: Payer: Self-pay | Admitting: Internal Medicine

## 2010-08-19 DIAGNOSIS — M5137 Other intervertebral disc degeneration, lumbosacral region: Secondary | ICD-10-CM

## 2010-08-19 NOTE — Telephone Encounter (Signed)
Refill request on Lidoderm patch 5%. Last refill 10/27/09 #90 with 5 refills, last OV 05/30/10.

## 2010-08-19 NOTE — Telephone Encounter (Signed)
Faxed hardcopy to pharmacy. 

## 2010-08-23 ENCOUNTER — Other Ambulatory Visit: Payer: Self-pay | Admitting: Internal Medicine

## 2010-08-25 ENCOUNTER — Other Ambulatory Visit: Payer: Self-pay

## 2010-08-25 ENCOUNTER — Other Ambulatory Visit: Payer: Self-pay | Admitting: *Deleted

## 2010-08-25 MED ORDER — PRASUGREL HCL 10 MG PO TABS: 10.0000 mg | ORAL_TABLET | Freq: Every day | ORAL | Status: DC

## 2010-09-27 ENCOUNTER — Encounter: Payer: Self-pay | Admitting: Internal Medicine

## 2010-09-27 ENCOUNTER — Other Ambulatory Visit: Payer: Self-pay | Admitting: Internal Medicine

## 2010-09-27 DIAGNOSIS — E1165 Type 2 diabetes mellitus with hyperglycemia: Secondary | ICD-10-CM

## 2010-09-27 DIAGNOSIS — E1169 Type 2 diabetes mellitus with other specified complication: Secondary | ICD-10-CM | POA: Insufficient documentation

## 2010-09-27 DIAGNOSIS — Z0000 Encounter for general adult medical examination without abnormal findings: Secondary | ICD-10-CM | POA: Insufficient documentation

## 2010-09-27 DIAGNOSIS — E669 Obesity, unspecified: Secondary | ICD-10-CM | POA: Insufficient documentation

## 2010-09-28 ENCOUNTER — Encounter: Payer: Self-pay | Admitting: Internal Medicine

## 2010-09-28 ENCOUNTER — Ambulatory Visit (INDEPENDENT_AMBULATORY_CARE_PROVIDER_SITE_OTHER): Payer: Medicare Other | Admitting: Internal Medicine

## 2010-09-28 VITALS — BP 122/70 | HR 88 | Temp 98.1°F | Ht 73.0 in | Wt 375.0 lb

## 2010-09-28 DIAGNOSIS — E860 Dehydration: Secondary | ICD-10-CM | POA: Insufficient documentation

## 2010-09-28 DIAGNOSIS — R112 Nausea with vomiting, unspecified: Secondary | ICD-10-CM | POA: Insufficient documentation

## 2010-09-28 DIAGNOSIS — J019 Acute sinusitis, unspecified: Secondary | ICD-10-CM | POA: Insufficient documentation

## 2010-09-28 DIAGNOSIS — R42 Dizziness and giddiness: Secondary | ICD-10-CM

## 2010-09-28 DIAGNOSIS — R110 Nausea: Secondary | ICD-10-CM | POA: Insufficient documentation

## 2010-09-28 DIAGNOSIS — Z0000 Encounter for general adult medical examination without abnormal findings: Secondary | ICD-10-CM

## 2010-09-28 MED ORDER — PROMETHAZINE HCL 25 MG PO TABS: 25.0000 mg | ORAL_TABLET | Freq: Four times a day (QID) | ORAL | Status: AC | PRN

## 2010-09-28 MED ORDER — MECLIZINE HCL 12.5 MG PO TABS: 12.5000 mg | ORAL_TABLET | Freq: Three times a day (TID) | ORAL | Status: AC | PRN

## 2010-09-28 MED ORDER — CEPHALEXIN 500 MG PO TABS: 500.0000 mg | ORAL_TABLET | Freq: Four times a day (QID) | ORAL | Status: AC

## 2010-09-28 NOTE — Progress Notes (Signed)
Subjective:    Patient ID: Travis [name], male    DOB: [birth year], 59 y.o.   MRN: [id number]  HPI   Here with 3 days acute onset fever, facial pain, pressure, general weakness and malaise, and greenish d/c, with slight ST, but little to no cough and Pt denies chest pain, increased sob or doe, wheezing, orthopnea, PND, increased LE swelling, palpitations, or syncope. Pt denies new neurological symptoms such as new headache, or facial or extremity weakness or numbness, but has vertigo and lightheaded type dizziness , as well as freq recurrent nausea, and one episode vomiting as well.  No other abd pain, dysphagia, bowel change or blood. Past Medical History  Diagnosis Date  . Impaired glucose tolerance 09/27/2010  . HYPERLIPIDEMIA-MIXED 07/15/2009  . DEPRESSION 06/24/2009  . Chronic pain syndrome 10/27/2009  . HYPERTENSION 06/24/2009  . CORONARY ARTERY DISEASE 06/24/2009  . ALLERGIC RHINITIS 06/24/2009  . URETHRAL STRICTURE 06/24/2009  . DEGENERATIVE JOINT DISEASE 06/24/2009  . SHOULDER PAIN, BILATERAL 06/24/2009  . ANKLE PAIN, RIGHT 06/24/2009  . C[place]on DISEASE, LUMBAR 04/19/2010  . SCIATICA, LEFT 04/19/2010  . FATIGUE 06/24/2009  . NEPHROLITHIASIS, HX OF 06/24/2009  . ERECTILE DYSFUNCTION, ORGANIC 05/30/2010   Past Surgical History  Procedure Date  . Tonsillectomy   . Rotator cuff repair     bilateral  . S/p knee replacement 2008  . S/p bilat wrist surgury   . S/p urethral dilations, multi 2009,2010  . Carpal tunnel release   . S/p right knee arthroscopy 03/2005    and multi prior  . S/p umbilical hernia   . S/p left knee arthroscopy june 2002    multi prior  . S/p right shoulder rotater cuff surgury april 2011    Dr. [name] Lighter    reports that he has quit smoking. His smoking use included Cigarettes. He does not have any smokeless tobacco history on file. He reports that he does not drink alcohol or use illicit drugs. family history includes Cancer in his father and mother; Coronary artery disease in  his other; Depression in his brother, mother, and other; Diabetes in his father; Heart disease in his father and mother; Hyperlipidemia in his father; and Hypertension in his brother, father, mother, and other. No Known Allergies Current Outpatient Prescriptions on File Prior to Visit  Medication Sig Dispense Refill  . amLODipine (NORVASC) 5 MG tablet Take 5 mg by mouth daily.        [name] aspirin 81 MG tablet Take 81 mg by mouth daily.        [name] atenolol (TENORMIN) 25 MG tablet Take 25 mg by mouth daily.        [name] atorvastatin (LIPITOR) 20 MG tablet Take 20 mg by mouth daily.        . benazepril (LOTENSIN) 20 MG tablet Take 20 mg by mouth daily.        [name] buPROPion (WELLBUTRIN XL) 300 MG 24 hr tablet Take 300 mg by mouth daily.        . cyclobenzaprine (FLEXERIL) 5 MG tablet Take 5 mg by mouth 3 (three) times daily as needed.        [name] FLUoxetine (PROZAC) 20 MG capsule Take 20 mg by mouth daily.        . fluticasone (FLONASE) 50 MCG/ACT nasal spray 2 sprays by Nasal route daily.        [name] gabapentin (NEURONTIN) 800 MG tablet Take 800 mg by mouth 3 (three) times daily.        [name]  indomethacin (INDOCIN) 25 MG capsule Take 25 mg by mouth 2 (two) times daily.        . isosorbide mononitrate (IMDUR) 60 MG 24 hr tablet Take 1 1/2 tablet by mouth daily       . LIDODERM 5 % APPLY 1 PATCH THREE TIMES DAILY AS NEEDED FOR PAIN  90 each  5  . mirtazapine (REMERON) 30 MG tablet TAKE 1 TABLET BY MOUTH AT BEDTIME  90 tablet  3  . nitroGLYCERIN (NITROSTAT) 0.3 MG SL tablet Use as directed as needed       . oxyCODONE (ROXICODONE) 15 MG immediate release tablet 1 by mouth five times a day as needed       . prasugrel (EFFIENT) 10 MG TABS Take 1 tablet (10 mg total) by mouth daily.  30 tablet  11  . zolpidem (AMBIEN) 10 MG tablet 2 by mouth at bedtime as needed       . diclofenac sodium (VOLTAREN) 1 % GEL Apply topically 3 (three) times daily.        . vardenafil (LEVITRA) 20 MG tablet Every other day as needed       .  DISCONTD: nitrofurantoin (MACRODANTIN) 50 MG capsule Take 50 mg by mouth daily.         Review of Systems All otherwise neg per pt  Objective:   Physical Exam BP 122/70  Pulse 88  Temp(Src) 98.1 F (36.7 C) (Oral)  Ht 6' 1"  (1.854 m)  Wt 375 lb (170.099 kg)  BMI 49.48 kg/m2  SpO2 96% Physical Exam  VS noted, mild ill, morbid obese, orthostatic by BP on standing Constitutional: Pt appears well-developed and well-nourished.  HENT: Head: Normocephalic.  Right Ear: External ear normal.  Left Ear: External ear normal.  Bilat tm's mild erythema.  Sinus tender bilat .  Pharynx mild erythema Eyes: Conjunctivae and EOM are normal. Pupils are equal, round, and reactive to light.  Neck: Normal range of motion. Neck supple.  Cardiovascular: Normal rate and regular rhythm.   Pulmonary/Chest: Effort normal and breath sounds normal.  Abd:  Soft, NT, non-distended, + BS Neurological: Pt is alert. No cranial nerve deficit.  Skin: Skin is warm. No erythema.  Psychiatric: Pt behavior is normal. Thought content normal. 1+ nervous        Assessment & Plan:

## 2010-09-28 NOTE — Assessment & Plan Note (Signed)
Likely assoc with sinus infection overall, for phenergan prn, has only rare vomiting , exam o/w benign today,  to f/u any worsening symptoms or concerns

## 2010-09-28 NOTE — Assessment & Plan Note (Signed)
As above, likely due to reduced po intake, to increase po fluids with antiemetics prn

## 2010-09-28 NOTE — Patient Instructions (Addendum)
Take all new medications as prescribed Continue all other medications as before Please return in Jan 2013 with Lab testing done 3-5 days before

## 2010-09-28 NOTE — Assessment & Plan Note (Signed)
Mild elev today likely situational, Continue all other medications as before

## 2010-09-28 NOTE — Assessment & Plan Note (Addendum)
C/w mild orthostasis , likely due to reduced po intake - for trial meclizine prn trial, and to increase po fluids

## 2010-09-28 NOTE — Assessment & Plan Note (Signed)
Mild to mod, for antibx course,  to f/u any worsening symptoms or concerns 

## 2010-10-12 ENCOUNTER — Encounter: Payer: Medicare Other | Attending: Neurosurgery | Admitting: Neurosurgery

## 2010-10-12 ENCOUNTER — Other Ambulatory Visit: Payer: Self-pay | Admitting: Internal Medicine

## 2010-10-12 DIAGNOSIS — M25579 Pain in unspecified ankle and joints of unspecified foot: Secondary | ICD-10-CM | POA: Insufficient documentation

## 2010-10-12 DIAGNOSIS — M25529 Pain in unspecified elbow: Secondary | ICD-10-CM | POA: Insufficient documentation

## 2010-10-12 DIAGNOSIS — M75100 Unspecified rotator cuff tear or rupture of unspecified shoulder, not specified as traumatic: Secondary | ICD-10-CM

## 2010-10-12 DIAGNOSIS — G894 Chronic pain syndrome: Secondary | ICD-10-CM | POA: Insufficient documentation

## 2010-10-12 DIAGNOSIS — M25569 Pain in unspecified knee: Secondary | ICD-10-CM | POA: Insufficient documentation

## 2010-10-13 NOTE — Assessment & Plan Note (Signed)
ACCOUNT:  [id number].  This is a gentleman who is followed by Dr. Letta Pate for multiple pain complaints.  He has got a chronic pain problem in his ankle and elbow, and that is managed by Dr. Beola Cord from [facility].  The patient states he has had a shoulder surgery on his right shoulder, and since that time, he has been pretty much "useless" for that shoulder, and he has had second opinion, they told him surgery would not do him any good.  Average pain is 7, sharp, burning, stabbing, and aching.  All activities aggravate his pain.  Rest, heat, and medication tend to help.  He walks without assistance, but does walk a cane occasionally.  He does have AFO brace on his leg.  He does drive and can climb steps.  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  PAST MEDICAL HISTORY:  Unchanged.  SOCIAL HISTORY:  He is single.  Lives with his girlfriend.  PHYSICAL EXAM:  VITAL SIGNS:  His blood pressure is 149/70, pulse 62, respirations 20, O2 sats 96 on room air. NEUROLOGIC:  Motor strength is 4-4/5 on the lower extremities.  His test against resistance.  Sensation somewhat diminished in right lower extremity, otherwise, positive and equal.  His affects is bright.  He is oriented x3.  Constitutionally, he is within normal limits, somewhat of an obese gentleman.  IMPRESSION: 1. Chronic pain syndrome. 2. Lower extremity pain, managed by Dr. Beola Cord. 3. Some right knee pain.  PLAN: 1. We will go ahead and refill his oxycodone 50 mg one p.o. 5 times a     day, 150 with no refill.  Then, I give him a new prescription. 2. Tramadol 50 mg one p.o. b.i.d., #60 with one refill. 3. He will follow up in my clinic in 3 months.  His questions were     encouraged and answered.     Salley Boxley L. Blenda Nicely Electronically  Signed    RLW/MedQ D:  10/12/2010 13:49:12  T:  10/13/2010 30:94:07  Job #:  [id number]

## 2010-10-13 NOTE — Telephone Encounter (Signed)
Faxed hardcopy to pharmacy. 

## 2010-11-29 ENCOUNTER — Ambulatory Visit: Payer: Self-pay | Admitting: Internal Medicine

## 2010-12-08 ENCOUNTER — Encounter: Payer: Self-pay | Admitting: Cardiology

## 2010-12-22 ENCOUNTER — Other Ambulatory Visit: Payer: Self-pay | Admitting: Internal Medicine

## 2010-12-22 ENCOUNTER — Telehealth: Payer: Self-pay | Admitting: *Deleted

## 2010-12-22 NOTE — Telephone Encounter (Signed)
Request for Handicap Placard that he previously had [w/MD in [place]. Is it Sylvia for Latoya to initiate application?

## 2010-12-22 NOTE — Telephone Encounter (Signed)
ok 

## 2010-12-26 NOTE — Telephone Encounter (Signed)
Approval from MD forwarded to [facility] on Fri 08/03/012.

## 2011-01-11 ENCOUNTER — Encounter: Payer: Self-pay | Admitting: Cardiology

## 2011-01-11 ENCOUNTER — Ambulatory Visit (INDEPENDENT_AMBULATORY_CARE_PROVIDER_SITE_OTHER): Payer: Medicare Other | Admitting: Cardiology

## 2011-01-11 VITALS — BP 132/80 | HR 61 | Resp 18 | Ht 72.0 in | Wt 359.0 lb

## 2011-01-11 DIAGNOSIS — E785 Hyperlipidemia, unspecified: Secondary | ICD-10-CM

## 2011-01-11 DIAGNOSIS — I10 Essential (primary) hypertension: Secondary | ICD-10-CM

## 2011-01-11 DIAGNOSIS — E669 Obesity, unspecified: Secondary | ICD-10-CM

## 2011-01-11 DIAGNOSIS — I2510 Atherosclerotic heart disease of native coronary artery without angina pectoris: Secondary | ICD-10-CM

## 2011-01-11 NOTE — Patient Instructions (Signed)
Schedule an appointment for fasting lab in the next 1-2 weeks-lipid profile/liver profile/BMP  414.01  Your physician wants you to follow-up in: 6 months with Dr Aundra Dubin. (February 2013).You will receive a reminder letter in the mail two months in advance. If you don't receive a letter, please call our office to schedule the follow-up appointment.

## 2011-01-12 ENCOUNTER — Encounter: Payer: Medicare Other | Attending: Neurosurgery | Admitting: Neurosurgery

## 2011-01-12 DIAGNOSIS — M545 Low back pain, unspecified: Secondary | ICD-10-CM | POA: Insufficient documentation

## 2011-01-12 DIAGNOSIS — M25519 Pain in unspecified shoulder: Secondary | ICD-10-CM | POA: Insufficient documentation

## 2011-01-12 DIAGNOSIS — M1710 Unilateral primary osteoarthritis, unspecified knee: Secondary | ICD-10-CM | POA: Insufficient documentation

## 2011-01-12 DIAGNOSIS — G894 Chronic pain syndrome: Secondary | ICD-10-CM | POA: Insufficient documentation

## 2011-01-12 DIAGNOSIS — M25569 Pain in unspecified knee: Secondary | ICD-10-CM

## 2011-01-12 DIAGNOSIS — M25579 Pain in unspecified ankle and joints of unspecified foot: Secondary | ICD-10-CM | POA: Insufficient documentation

## 2011-01-12 DIAGNOSIS — M79609 Pain in unspecified limb: Secondary | ICD-10-CM | POA: Insufficient documentation

## 2011-01-12 DIAGNOSIS — IMO0001 Reserved for inherently not codable concepts without codable children: Secondary | ICD-10-CM | POA: Insufficient documentation

## 2011-01-12 DIAGNOSIS — E669 Obesity, unspecified: Secondary | ICD-10-CM | POA: Insufficient documentation

## 2011-01-12 NOTE — Progress Notes (Signed)
PCP: Dr. Jenny Reichmann  60 yo with history of CAD s/p multiple PCIs and obesity presents for followup.  He has been doing well.  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  Football practice has started, and he has been losing weight (down 11 lbs since last appointment).  He had 1 episode of chest pain: walked his dog uphill on a hot day, then had some mild chest pain when he went inside and sat down afterwards.  Did not need NTG.  Has not had any other chest pain.  No significant exertional dyspnea.  He is mainly limited by his orthopedic problems (back and right foot).  He is on Imdur and has not been using Levitra.  Labs (2/11): K 4.9, creatinine 1.1, HDL 42, LDL 45, TG 59, LFTs normal Labs (3/11): VerifyNow clopidogrel resistance testing - 31% inhibition, 241 PRU (low level of platelet inhibition with Plavix).  Labs (1/12): LDL 51, HDL 39, K 4.5, creatinine 0.8, TSH normal  ECG: NSR, normal  Allergies:  No Known Drug Allergies  Past Medical History: 1. CORONARY ARTERY DISEASE (ICD-414.00): Has had multiple PCIs, reports he never had an MI (just angina).  12/08 had Taxus DES to mLAD, Endeavor DES to [facility] and to dCFX.  11/09 had BMS x 2 to mid RCA.  1/09 had Xience DES to dCFX, mCFX, and pCFX.  Lexiscan myoview (3/11): EF 50%, apical hypokinesis, possible small inferoapical infarct with no ischemia.  Low risk.  2.  HYPERTENSION (ICD-401.9) 3. DEPRESSION (ICD-311) 4. URETHRAL STRICTURE (ICD-598.9) 5. NEPHROLITHIASIS, HX OF (ICD-V13.01) 6. ALLERGIC RHINITIS (ICD-477.9) 7. DEGENERATIVE JOINT DISEASE (ICD-715.90): s/p bilateral TKRs 8. OSA: Not using CPAP very often.  9. Hyperlipidemia 10. Obesity 11. Echo (3/11): EF 55-60%, normal wall motion, grade I diastolic dysfunction, normal valves, normal RV.  12. Clopidogrel resistant 13. Rotator cuff repair 4/11 14. Lumbar disk disease with chronic low back pain.  15. glucose intolerance 16.  Erectile dysfunction  Family History: mother with met cancer, unknown primary, DJD, heart disease, depression, HTN, breast cancer father with DM, heart disease/CABG,  skin cancers, HTN, elev chol, and prostate cancer grandparent wih DJD, depression, CAD, HTN brother x 2 with HTN, depression  Social History: Divorced, moved here from [place].  Played semi-pro football, used steroids 2 children - grown disabled since 2006 - due to ortho, heart, psych Former Smoker - rare cigar now Alcohol use-no - very occasionally Drug use-no work - Part time work as an Multimedia programmer, wrestling, and Recruitment consultant at [employer]

## 2011-01-12 NOTE — Assessment & Plan Note (Addendum)
Patient is morbidly obese.  I strongly encouraged him to continue to work on weight loss.  He has done well this summer but tends to put the weight back on over the winter.  Needs to follow a steady diet and exercise routine.

## 2011-01-12 NOTE — Assessment & Plan Note (Signed)
Patient has CAD s/p multiple PCIs.  He has the cards for a total of 8 stents, most recently in 11/09.  Lexiscan myoview in 2011 was low risk with probable small inferoapical infarct and no ischemia.  EF preserved on echo and myoview.  One episode of somewhat atypical chest pain recently.  Main limitation is back pain.  - Continue ASA, ACEI, atenolol, Effient, statin, Imdur.   - We discussed not taking Levitra when using Imdur.  He understands the risks well and has not used Levitra for months.

## 2011-01-12 NOTE — Assessment & Plan Note (Signed)
Check lipids/LFTs today, goal LDL < 70.

## 2011-01-12 NOTE — Assessment & Plan Note (Signed)
This is a patient followed by Dr. Letta Pate for multiple pain problems mostly low back pain with chronic ankle and elbow pain which is somewhat managed by Dr. Beola Cord at [facility].  He has got a history of right shoulder surgery but today complains of a little bit of increased low back pain with some left buttock and thigh pain.  He rates his pain as 6-7.  It is intermittent stabbing, burning, sharp pain with aching sensation.  General activity level is 3-4.  The pain is worse in the morning and evening.  Sleep patterns are fair.  Pain is worse with walking, standing.  Rest, heat, and medication tend to help.  He walks without assistance.  He can walk about 20 minutes at a time.  He climbs steps and drives.  He does have AFO brace on his right leg.  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  REVIEW OF SYSTEMS:  Notable for those difficulties as well as some depression.  No suicidal thoughts or aberrant behaviors.  He has Oswestry score of 44.  PAST MEDICAL HISTORY:  Unchanged.  SOCIAL HISTORY:  Married.  FAMILY HISTORY:  Unchanged.  PHYSICAL EXAMINATION:  His blood pressure is 166/68, pulse 74, respirations 18, O2 sats 99 on room air.  Motor strength is 5/5 in lower extremities.  Sensation is intact.  Constitutionally he is obese.  He is alert and oriented x3.  He has a fairly normal gait considering the ankle problems.  ASSESSMENT: 1. Chronic pain syndrome. 2. Lower extremity pain managed by Dr. Beola Cord. 3. Right knee osteoarthritis/pain.  PLAN: 1. Refill oxycodone 50 mg up to 5 times a day, #150 with no refill. 2. Tramadol 50 mg 1 p.o. b.i.d., #60 with 3 refills. 3. He will follow up in clinic in 3 months.  His questions were     encouraged and answered.     Noreta Kue L. Blenda Nicely Electronically Signed    RLW/MedQ D:  01/12/2011 14:43:33  T:  01/12/2011 21:32:02  Job #:  [id number]

## 2011-01-12 NOTE — Assessment & Plan Note (Signed)
BP is reasonably controlled.  

## 2011-01-25 ENCOUNTER — Other Ambulatory Visit: Payer: Medicare Other | Admitting: *Deleted

## 2011-02-06 ENCOUNTER — Other Ambulatory Visit: Payer: Self-pay

## 2011-02-06 MED ORDER — INDOMETHACIN 25 MG PO CAPS: 25.0000 mg | ORAL_CAPSULE | Freq: Two times a day (BID) | ORAL | Status: DC | PRN

## 2011-02-06 NOTE — Telephone Encounter (Signed)
Done per emr 

## 2011-02-06 NOTE — Telephone Encounter (Signed)
Patient is requesting a refill on Indomethacin 25 mg. His orthopedic MD had prescribed this for him but no longer will do so. He does have a new pharmacy called the patient left message to call back to confirm pharmacy

## 2011-02-07 ENCOUNTER — Telehealth: Payer: Self-pay

## 2011-02-07 MED ORDER — INDOMETHACIN 25 MG PO CAPS: 25.0000 mg | ORAL_CAPSULE | Freq: Four times a day (QID) | ORAL | Status: DC | PRN

## 2011-02-07 NOTE — Telephone Encounter (Signed)
Done per emr 

## 2011-02-07 NOTE — Telephone Encounter (Signed)
The Patient called informed he has been taking Indomethacin 25 mg 4 daily and his refill was 2 daily, please advise also his new pharmacy is [facility].

## 2011-02-08 NOTE — Telephone Encounter (Signed)
Called patient left message refill sent in as requested

## 2011-03-27 ENCOUNTER — Other Ambulatory Visit: Payer: Self-pay

## 2011-03-27 MED ORDER — ATENOLOL 25 MG PO TABS: 25.0000 mg | ORAL_TABLET | Freq: Every day | ORAL | Status: DC

## 2011-03-27 MED ORDER — AMLODIPINE BESYLATE 5 MG PO TABS: 5.0000 mg | ORAL_TABLET | Freq: Every day | ORAL | Status: DC

## 2011-03-27 MED ORDER — BENAZEPRIL HCL 20 MG PO TABS: 20.0000 mg | ORAL_TABLET | Freq: Every day | ORAL | Status: DC

## 2011-03-28 ENCOUNTER — Other Ambulatory Visit: Payer: Self-pay | Admitting: *Deleted

## 2011-03-28 MED ORDER — ISOSORBIDE MONONITRATE ER 60 MG PO TB24: ORAL_TABLET | ORAL | Status: DC

## 2011-04-06 ENCOUNTER — Encounter: Payer: Medicare Other | Attending: Neurosurgery | Admitting: Neurosurgery

## 2011-04-06 DIAGNOSIS — M1710 Unilateral primary osteoarthritis, unspecified knee: Secondary | ICD-10-CM | POA: Insufficient documentation

## 2011-04-06 DIAGNOSIS — G894 Chronic pain syndrome: Secondary | ICD-10-CM | POA: Insufficient documentation

## 2011-04-06 DIAGNOSIS — M545 Low back pain, unspecified: Secondary | ICD-10-CM | POA: Insufficient documentation

## 2011-04-06 DIAGNOSIS — M79609 Pain in unspecified limb: Secondary | ICD-10-CM | POA: Insufficient documentation

## 2011-04-06 DIAGNOSIS — M25569 Pain in unspecified knee: Secondary | ICD-10-CM | POA: Insufficient documentation

## 2011-04-06 NOTE — Assessment & Plan Note (Signed)
This is a patient of Dr. Letta Pate seen for multiple pain complaints as well as low back and knee pain.  He does have a brace on his right ankle and reports no change in his pain. It is 4-6.  It is sharp, burning, stabbing, aching type pain.  General activity level is 4.  Pain is worse in the evening.  Sleep patterns are poor.  Pain is worse with walking and activity and standing.  Heat, ice, and medication tend to help.  He does walk with assistance.  He can walk up to 30 minutes at a time.  He climbs steps and drives.  Functionally he is retired.  He does need some help with household duties.  He does work part-time as a Leisure centre manager.  REVIEW OF SYSTEMS:  Notable for difficulties described above as well as some diarrhea, weight loss, weakness, trouble walking, depression, anxiety.  No suicidal thoughts or aberrant behaviors.  Last UDS and pill counts consistent.  PAST MEDICAL HISTORY/SOCIAL HISTORY/FAMILY HISTORY:  Unchanged.  PHYSICAL EXAMINATION:  Blood pressure 147/82, pulse 63, respirations 18, O2 sats 98 on room air.  His motor strength and sensation are intact. He cannot dorsiflex or plantar flex his right foot.  Constitutionally he is obese, alert and oriented x3.  He does walk with a limp.  ASSESSMENT: 1. Chronic pain syndrome. 2. Lower extremity pain also managed by Dr. Beola Cord. 3. Right knee osteoarthritis.  PLAN:  He was given a prescription for oxycodone 15 mg, 1 up to 5 times a day, 150 with no refill.  He has tramadol refills available, we will keep that filled for him.  We will see him back in clinic in 3 months. His questions were encouraged and answered.     Candus Braud L. Blenda Nicely Electronically Signed    RLW/MedQ D:  04/06/2011 11:47:31  T:  04/06/2011 20:15:52  Job #:  [id number]

## 2011-04-10 ENCOUNTER — Telehealth: Payer: Self-pay

## 2011-04-11 NOTE — Telephone Encounter (Signed)
A user error has taken place: encounter opened in error, closed for administrative reasons.

## 2011-04-26 ENCOUNTER — Other Ambulatory Visit: Payer: Self-pay

## 2011-04-26 MED ORDER — ZOLPIDEM TARTRATE 10 MG PO TABS: 10.0000 mg | ORAL_TABLET | Freq: Every evening | ORAL | Status: DC | PRN

## 2011-04-26 NOTE — Telephone Encounter (Signed)
Faxed prescription to [place]

## 2011-05-05 ENCOUNTER — Other Ambulatory Visit: Payer: Self-pay | Admitting: Internal Medicine

## 2011-05-24 ENCOUNTER — Other Ambulatory Visit (INDEPENDENT_AMBULATORY_CARE_PROVIDER_SITE_OTHER): Payer: Medicare Other | Admitting: *Deleted

## 2011-05-24 DIAGNOSIS — Z0000 Encounter for general adult medical examination without abnormal findings: Secondary | ICD-10-CM

## 2011-05-24 LAB — LIPID PANEL
HDL: 31.9 mg/dL — ABNORMAL LOW (ref >39.00)
LDL Cholesterol: 44 mg/dL (ref 0–99)
Total CHOL/HDL Ratio: 3
Triglycerides: 63 mg/dL (ref 0.0–149.0)
VLDL: 12.6 mg/dL (ref 0.0–40.0)

## 2011-05-24 LAB — URINALYSIS, ROUTINE W REFLEX MICROSCOPIC
Hgb urine dipstick: NEGATIVE
Ketones, ur: NEGATIVE
Total Protein, Urine: NEGATIVE
Urine Glucose: NEGATIVE

## 2011-05-24 LAB — BASIC METABOLIC PANEL
CO2: 26 mEq/L (ref 19–32)
Chloride: 111 mEq/L (ref 96–112)
Glucose, Bld: 143 mg/dL — ABNORMAL HIGH (ref 70–99)
Potassium: 4.7 mEq/L (ref 3.5–5.1)
Sodium: 142 mEq/L (ref 135–145)

## 2011-05-24 LAB — HEPATIC FUNCTION PANEL
Albumin: 3.6 g/dL (ref 3.5–5.2)
Total Bilirubin: 0.4 mg/dL (ref 0.3–1.2)

## 2011-05-24 LAB — TSH: TSH: 1.39 u[IU]/mL (ref 0.35–5.50)

## 2011-05-24 LAB — CBC WITH DIFFERENTIAL/PLATELET
Eosinophils Relative: 4.2 % (ref 0.0–5.0)
HCT: 37 % — ABNORMAL LOW (ref 39.0–52.0)
Monocytes Relative: 11 % (ref 3.0–12.0)
Neutrophils Relative %: 44.5 % (ref 43.0–77.0)
Platelets: 222 10*3/uL (ref 150.0–400.0)
RBC: 4.13 Mil/uL — ABNORMAL LOW (ref 4.22–5.81)
WBC: 4.6 10*3/uL (ref 4.5–10.5)

## 2011-05-26 ENCOUNTER — Other Ambulatory Visit: Payer: Self-pay

## 2011-05-26 MED ORDER — BUPROPION HCL ER (XL) 300 MG PO TB24: 300.0000 mg | ORAL_TABLET | Freq: Every day | ORAL | Status: DC

## 2011-05-29 ENCOUNTER — Ambulatory Visit: Payer: Medicare Other | Admitting: Internal Medicine

## 2011-05-29 DIAGNOSIS — Z0289 Encounter for other administrative examinations: Secondary | ICD-10-CM

## 2011-05-31 ENCOUNTER — Ambulatory Visit: Payer: Medicare Other | Admitting: Internal Medicine

## 2011-06-01 ENCOUNTER — Telehealth: Payer: Self-pay | Admitting: Cardiology

## 2011-06-01 NOTE — Telephone Encounter (Signed)
Called [facility] regarding Dr Claris Gladden note. They have since faxed a Medical Clearance form to this office. Pt is needing 3 teeth pulled and they do need the form.  Form placed in Dr Claris Gladden box in Team Room B.

## 2011-06-01 NOTE — Telephone Encounter (Signed)
New Msg: Pt calling wanting make sure MD approved for pt to get teeth cleaned. Please return pt call to discuss further if necessary.

## 2011-06-01 NOTE — Telephone Encounter (Signed)
It would be ok for him to have his teeth cleaned.  He will tend to bleed a bit more with Effient.

## 2011-06-01 NOTE — Telephone Encounter (Signed)
This pt is inquiring about getting his teeth cleaned while on effient. He was having an issue with a tooth but that tooth has since fallen out. His dentist with [facility] needs clearance by Cardiology to clean his teeth while on effient.  [facility] notified at [phone] to fax form they are requiring for pt to be able to have his teeth cleaned.

## 2011-06-02 NOTE — Telephone Encounter (Signed)
Dr Aundra Dubin completed Medical Clearance for Dental Services form from [facility] requesting information about Effient. Dr Aundra Dubin has stated on the form that if needed can  hold effient for 5 days before tooth extraction.  This form has been forwarded to HIM to be faxed back to [facility].

## 2011-06-05 ENCOUNTER — Ambulatory Visit (INDEPENDENT_AMBULATORY_CARE_PROVIDER_SITE_OTHER): Payer: Medicare HMO | Admitting: Internal Medicine

## 2011-06-05 ENCOUNTER — Encounter: Payer: Self-pay | Admitting: Internal Medicine

## 2011-06-05 VITALS — BP 108/68 | HR 64 | Temp 97.3°F | Ht 72.0 in | Wt 363.2 lb

## 2011-06-05 DIAGNOSIS — Z23 Encounter for immunization: Secondary | ICD-10-CM

## 2011-06-05 DIAGNOSIS — Z0000 Encounter for general adult medical examination without abnormal findings: Secondary | ICD-10-CM

## 2011-06-05 MED ORDER — ZOLPIDEM TARTRATE 10 MG PO TABS: ORAL_TABLET | ORAL | Status: DC

## 2011-06-05 MED ORDER — PNEUMOCOCCAL VAC POLYVALENT 25 MCG/0.5ML IJ INJ: 0.5000 mL | INJECTION | Freq: Once | INTRAMUSCULAR | Status: DC

## 2011-06-05 NOTE — Progress Notes (Signed)
Subjective:    Patient ID: Travis [name], male    DOB: 01-23-1951, 61 y.o.   MRN: [id number]  HPI  Here for wellness and f/u;  Overall doing ok;  Pt denies CP, worsening SOB, DOE, wheezing, orthopnea, PND, worsening LE edema, palpitations, dizziness or syncope.  Pt denies neurological change such as new Headache, facial or extremity weakness.  Pt denies polydipsia, polyuria, or low sugar symptoms. Pt states overall good compliance with treatment and medications, good tolerability, and trying to follow lower cholesterol diet.  Pt denies worsening depressive symptoms, suicidal ideation or panic. No fever, wt loss, night sweats, loss of appetite, or other constitutional symptoms.  Pt states good ability with ADL's, low fall risk, home safety reviewed and adequate, no significant changes in hearing or vision, and occasionally active with exercise.  One zolpidem at night is not enough, needs 2 as this has worked in the past.  Due for flu shot and pnuemovax today.   Past Medical History  Diagnosis Date  . Impaired glucose tolerance 09/27/2010  . HYPERLIPIDEMIA-MIXED 07/15/2009  . DEPRESSION 06/24/2009  . Chronic pain syndrome 10/27/2009  . HYPERTENSION 06/24/2009  . CORONARY ARTERY DISEASE 06/24/2009  . ALLERGIC RHINITIS 06/24/2009  . URETHRAL STRICTURE 06/24/2009  . DEGENERATIVE JOINT DISEASE 06/24/2009  . SHOULDER PAIN, BILATERAL 06/24/2009  . ANKLE PAIN, RIGHT 06/24/2009  . [place] DISEASE, LUMBAR 04/19/2010  . SCIATICA, LEFT 04/19/2010  . FATIGUE 06/24/2009  . NEPHROLITHIASIS, HX OF 06/24/2009  . ERECTILE DYSFUNCTION, ORGANIC 05/30/2010  . OSA (obstructive sleep apnea)     not using CPAP very often  . Obesity   . Allergy to clopidogrel    Past Surgical History  Procedure Date  . Tonsillectomy   . Rotator cuff repair     bilateral  . S/p knee replacement 2008  . S/p bilat wrist surgury   . S/p urethral dilations, multi 2009,2010  . Carpal tunnel release   . S/p right knee arthroscopy 03/2005    and multi  prior  . S/p umbilical hernia   . S/p left knee arthroscopy june 2002    multi prior  . S/p right shoulder rotater cuff surgury april 2011    Dr. [name] Lighter    reports that he has quit smoking. His smoking use included Cigarettes. He does not have any smokeless tobacco history on file. He reports that he does not drink alcohol or use illicit drugs. family history includes Cancer in his father and mother; Coronary artery disease in his other; Depression in his brother, mother, and other; Diabetes in his father; Heart disease in his father and mother; Hyperlipidemia in his father; and Hypertension in his brother, father, mother, and other. No Known Allergies Current Outpatient Prescriptions on File Prior to Visit  Medication Sig Dispense Refill  . amLODipine (NORVASC) 5 MG tablet Take 1 tablet (5 mg total) by mouth daily.  90 tablet  2  . aspirin 81 MG tablet Take 81 mg by mouth daily.        [name] atenolol (TENORMIN) 25 MG tablet Take 1 tablet (25 mg total) by mouth daily.  90 tablet  2  . atorvastatin (LIPITOR) 20 MG tablet Take 20 mg by mouth daily.        . benazepril (LOTENSIN) 20 MG tablet Take 1 tablet (20 mg total) by mouth daily.  90 tablet  2  . buPROPion (WELLBUTRIN XL) 300 MG 24 hr tablet Take 1 tablet (300 mg total) by mouth daily.  30 tablet  6  .  cyclobenzaprine (FLEXERIL) 5 MG tablet TAKE 1 TABLET BY MOUTH THREE TIMES DAILY AS NEEDED  90 tablet  7  . diclofenac sodium (VOLTAREN) 1 % GEL Apply 1 application topically 3 (three) times daily.        [name] FLUoxetine (PROZAC) 20 MG capsule TAKE ONE CAPSULE BY MOUTH ONE TIME DAILY  30 capsule  6  . fluticasone (FLONASE) 50 MCG/ACT nasal spray 2 sprays by Nasal route daily.        [name] gabapentin (NEURONTIN) 800 MG tablet Take 800 mg by mouth 3 (three) times daily.        . indomethacin (INDOCIN) 25 MG capsule Take 1 capsule (25 mg total) by mouth 4 (four) times daily as needed.  120 capsule  5  . isosorbide mononitrate (IMDUR) 60 MG 24 hr tablet  Take 1 1/2 tablet by mouth daily  45 tablet  5  . LIDODERM 5 % APPLY 1 PATCH THREE TIMES DAILY AS NEEDED FOR PAIN  90 each  5  . meclizine (ANTIVERT) 12.5 MG tablet Take 1 tablet (12.5 mg total) by mouth 3 (three) times daily as needed.  30 tablet  1  . mirtazapine (REMERON) 30 MG tablet TAKE 1 TABLET BY MOUTH AT BEDTIME  90 tablet  3  . nitrofurantoin (MACRODANTIN) 50 MG capsule Take 50 mg by mouth daily.        . nitroGLYCERIN (NITROSTAT) 0.3 MG SL tablet Use as directed as needed       . oxyCODONE (ROXICODONE) 15 MG immediate release tablet 1 by mouth five times a day as needed       . prasugrel (EFFIENT) 10 MG TABS Take 1 tablet (10 mg total) by mouth daily.  30 tablet  11  . vardenafil (LEVITRA) 20 MG tablet Take 20 mg by mouth every other day.         No current facility-administered medications on file prior to visit.   Review of Systems Review of Systems  Constitutional: Negative for diaphoresis, activity change, appetite change and unexpected weight change.  HENT: Negative for hearing loss, ear pain, facial swelling, mouth sores and neck stiffness.   Eyes: Negative for pain, redness and visual disturbance.  Respiratory: Negative for shortness of breath and wheezing.   Cardiovascular: Negative for chest pain and palpitations.  Gastrointestinal: Negative for diarrhea, blood in stool, abdominal distention and rectal pain.  Genitourinary: Negative for hematuria, flank pain and decreased urine volume.  Musculoskeletal: Negative for myalgias and joint swelling.  Skin: Negative for color change and wound.  Neurological: Negative for syncope and numbness.  Hematological: Negative for adenopathy.  Psychiatric/Behavioral: Negative for hallucinations, self-injury, decreased concentration and agitation. '     Objective:   Physical Exam BP 108/68  Pulse 64  Temp(Src) 97.3 F (36.3 C) (Oral)  Ht 6' (1.829 m)  Wt 363 lb 4 oz (164.769 kg)  BMI 49.27 kg/m2  SpO2 97% Physical Exam  VS  noted Constitutional: Pt is oriented to person, place, and time. Appears well-developed and well-nourished.  HENT:  Head: Normocephalic and atraumatic.  Right Ear: External ear normal.  Left Ear: External ear normal.  Nose: Nose normal.  Mouth/Throat: Oropharynx is clear and moist.  Eyes: Conjunctivae and EOM are normal. Pupils are equal, round, and reactive to light.  Neck: Normal range of motion. Neck supple. No JVD present. No tracheal deviation present.  Cardiovascular: Normal rate, regular rhythm, normal heart sounds and intact distal pulses.   Pulmonary/Chest: Effort normal and breath sounds normal.  Abdominal: Soft. Bowel sounds are normal. There is no tenderness.  Musculoskeletal: Normal range of motion. Exhibits no edema.  Lymphadenopathy:  Has no cervical adenopathy.  Neurological: Pt is alert and oriented to person, place, and time. Pt has normal reflexes. No cranial nerve deficit.  Skin: Skin is warm and dry. No rash noted.  Psychiatric:  Has  normal mood and affect. Behavior is normal.     Assessment & Plan:

## 2011-06-05 NOTE — Assessment & Plan Note (Signed)

## 2011-06-05 NOTE — Patient Instructions (Addendum)
Please show the insurance card to the front desk as you leave - [facility] You had the flu shot, and the pneumonia shots today Your zolpidem was increased today Continue all other medications as before Please return in 6 months, or sooner if needed Please call later after your insurance is corrected, so we can pre-order the blood work before next visit

## 2011-06-14 ENCOUNTER — Telehealth: Payer: Self-pay

## 2011-06-14 NOTE — Telephone Encounter (Signed)
Dr Jenny Reichmann increased to 2x10 mg qhs

## 2011-06-14 NOTE — Telephone Encounter (Signed)
Called the pharmacy they stated the patient got medication filled #90 on 04/26/11 and not due for next refill until Jan. 29, 2013. Patient states he is completely out, please advise

## 2011-06-14 NOTE — Telephone Encounter (Signed)
Patients most recent rx for Zolpidem 10 mg was to take BID (Which is how he takes). But previous was for Zolpidem 10 mg qd. The pharmacy will not fill Zolpidem 10 mg BID and patient has been out for 2 days. Needs MD to authorize, please advise

## 2011-06-15 MED ORDER — ALPRAZOLAM 1 MG PO TABS: 1.0000 mg | ORAL_TABLET | Freq: Every evening | ORAL | Status: AC | PRN

## 2011-06-15 NOTE — Telephone Encounter (Signed)
I authorize a NEW one time rx for #30 xanax 7m qhs (no refills) to use in place of bid zolpidem 188mtabs. - let pt know same and tpt should NOT pick up 06/20/11 rx until further discussion with [facility] week (to disp #180/mo if appropriate) - thanks

## 2011-06-15 NOTE — Telephone Encounter (Signed)
Pt advised and understood. 

## 2011-06-21 ENCOUNTER — Telehealth: Payer: Self-pay

## 2011-06-21 MED ORDER — ZOLPIDEM TARTRATE 10 MG PO TABS: ORAL_TABLET | ORAL | Status: DC

## 2011-06-21 NOTE — Telephone Encounter (Signed)
Please review phone notes from 06/14/2011. Patient called today and the pharmacy will not fill his Zolipidem prescription without override from MD. Please advise as has been out for 10 days the xanax is taking the edge off some but only sleeping 2-3 hours per night.

## 2011-06-21 NOTE — Telephone Encounter (Signed)
90 day x 1 ref rx re-done for [place] as before - Done hardcopy to [facility]

## 2011-06-22 NOTE — Telephone Encounter (Signed)
Faxed hardcopy to pharmacy. Called the patient left message that prescription has been faxed to pharmacy.

## 2011-06-25 ENCOUNTER — Other Ambulatory Visit: Payer: Self-pay | Admitting: Internal Medicine

## 2011-06-26 ENCOUNTER — Telehealth: Payer: Self-pay

## 2011-06-26 MED ORDER — BENZONATATE 100 MG PO CAPS: ORAL_CAPSULE | ORAL | Status: DC

## 2011-06-26 NOTE — Telephone Encounter (Signed)
Done per emr 

## 2011-06-26 NOTE — Telephone Encounter (Signed)
Patient is requesting the cough pills called in to his pharmacy.

## 2011-06-26 NOTE — Telephone Encounter (Signed)
Patient informed. 

## 2011-07-11 ENCOUNTER — Encounter: Payer: Medicare HMO | Attending: Physical Medicine & Rehabilitation

## 2011-07-11 ENCOUNTER — Ambulatory Visit (HOSPITAL_BASED_OUTPATIENT_CLINIC_OR_DEPARTMENT_OTHER): Payer: Medicare HMO | Admitting: Physical Medicine & Rehabilitation

## 2011-07-11 ENCOUNTER — Encounter: Payer: Self-pay | Admitting: Physical Medicine & Rehabilitation

## 2011-07-11 ENCOUNTER — Ambulatory Visit: Payer: Medicare Other | Admitting: Neurosurgery

## 2011-07-11 VITALS — BP 122/56 | HR 68 | Resp 18 | Ht 71.0 in | Wt 362.0 lb

## 2011-07-11 DIAGNOSIS — M48061 Spinal stenosis, lumbar region without neurogenic claudication: Secondary | ICD-10-CM

## 2011-07-11 DIAGNOSIS — G8928 Other chronic postprocedural pain: Secondary | ICD-10-CM

## 2011-07-11 DIAGNOSIS — M545 Low back pain, unspecified: Secondary | ICD-10-CM | POA: Insufficient documentation

## 2011-07-11 DIAGNOSIS — M549 Dorsalgia, unspecified: Secondary | ICD-10-CM | POA: Insufficient documentation

## 2011-07-11 DIAGNOSIS — M5137 Other intervertebral disc degeneration, lumbosacral region: Secondary | ICD-10-CM | POA: Insufficient documentation

## 2011-07-11 DIAGNOSIS — M542 Cervicalgia: Secondary | ICD-10-CM | POA: Insufficient documentation

## 2011-07-11 DIAGNOSIS — R209 Unspecified disturbances of skin sensation: Secondary | ICD-10-CM | POA: Insufficient documentation

## 2011-07-11 DIAGNOSIS — IMO0001 Reserved for inherently not codable concepts without codable children: Secondary | ICD-10-CM | POA: Insufficient documentation

## 2011-07-11 DIAGNOSIS — M5136 Other intervertebral disc degeneration, lumbar region: Secondary | ICD-10-CM

## 2011-07-11 DIAGNOSIS — G8929 Other chronic pain: Secondary | ICD-10-CM | POA: Insufficient documentation

## 2011-07-11 DIAGNOSIS — M51379 Other intervertebral disc degeneration, lumbosacral region without mention of lumbar back pain or lower extremity pain: Secondary | ICD-10-CM | POA: Insufficient documentation

## 2011-07-11 DIAGNOSIS — M533 Sacrococcygeal disorders, not elsewhere classified: Secondary | ICD-10-CM | POA: Insufficient documentation

## 2011-07-11 MED ORDER — OXYCODONE HCL 15 MG PO TABS: 15.0000 mg | ORAL_TABLET | ORAL | Status: DC | PRN

## 2011-07-11 NOTE — Progress Notes (Signed)
  Subjective:    Patient ID: Travis [name], male    DOB: Mar 26, 1951, 61 y.o.   MRN: [id number]  Back Pain This is a chronic problem. The current episode started more than 1 year ago (1975). The problem occurs constantly. The problem has been gradually worsening since onset. The pain is present in the lumbar spine, gluteal and sacro-iliac. The quality of the pain is described as aching, burning and stabbing. The pain radiates to the left knee. The pain is at a severity of 6/10. The pain is moderate. The pain is worse during the night. The symptoms are aggravated by standing. Stiffness is present in the morning. Associated symptoms include numbness and tingling. Risk factors include history of steroid use and history of osteoporosis (steroids 40 yrs ago). He has tried muscle relaxant, NSAIDs, analgesics, ice and heat for the symptoms.  Neck Pain  Associated symptoms include numbness and tingling.      Review of Systems  Constitutional: Negative.   HENT: Positive for neck pain.   Eyes: Negative.   Respiratory: Negative.   Cardiovascular: Negative.   Gastrointestinal: Negative.   Genitourinary: Negative.   Musculoskeletal: Positive for back pain.  Skin: Negative.   Neurological: Positive for tingling and numbness.  Hematological: Negative.   Psychiatric/Behavioral: Negative.        Objective:   Physical Exam  Constitutional: He is oriented to person, place, and time.  Musculoskeletal:       Lumbar back: He exhibits decreased range of motion and pain. He exhibits no tenderness, no bony tenderness, no swelling, no edema, no deformity, no laceration and no spasm.       Back:  Neurological: He is alert and oriented to person, place, and time. He has normal strength.       Except r ankle in afo  back pain with extension Gait:  No assistive device but has AFO double metal upright RLE Mood/afect appropriate Ext:  -C/C/E Motor Bilateral quads 5/5, Bilateral HF 5/5, 5/5 in LLE ankle, RLE  in AFO MSK:  Bilateral knees s/p TKR with medio lateral instability but no seliing or tenderness      Assessment & Plan:  1.  Chronic low back pain lumbar degenerative disc, lumbar stenosis Cont oxycodone 62m 5 times per day.No new diagnostic testing or interventional procedures recommended. 2.  Morbid Obessty f/u Dr. JJudi Congprimary care.  Gave him obesity hand out.

## 2011-07-11 NOTE — Patient Instructions (Signed)
Obesity Obesity is defined as having a body mass index (BMI) of 30 or more. To calculate your BMI divide your weight in pounds by your height in inches squared and multiply that product by 703. Major illnesses resulting from long-term obesity include:  Stroke.   Heart disease.   Diabetes.   Many cancers.   Arthritis.  Obesity also complicates recovery from many other medical problems.  CAUSES   A history of obesity in your parents.   Thyroid hormone imbalance.   Environmental factors such as excess calorie intake and physical inactivity.  TREATMENT  A healthy weight loss program includes:  A calorie restricted diet based on individual calorie needs.   Increased physical activity (exercise).  An exercise program is just as important as the right low-calorie diet.  Weight-loss medicines should be used only under the supervision of your physician. These medicines help, but only if they are used with diet and exercise programs. Medicines can have side effects including nervousness, nausea, abdominal pain, diarrhea, headache, drowsiness, and depression.  An unhealthy weight loss program includes:  Fasting.   Fad diets.   Supplements and drugs.  These choices do not succeed in long-term weight control.  HOME CARE INSTRUCTIONS  To help you make the needed dietary changes:   Exercise and perform physical activity as directed by your caregiver.   Keep a daily record of everything you eat. There are many free websites to help you with this. It may be helpful to measure your foods so you can determine if you are eating the correct portion sizes.   Use low-calorie cookbooks or take special cooking classes.   Avoid alcohol. Drink more water and drinks with no calories.   Take vitamins and supplements only as recommended by your caregiver.   Weight loss support groups, Registered Dieticians, counselors, and stress reduction education can also be very helpful.  Document Released:  06/15/2004 Document Revised: 01/18/2011 Document Reviewed: 04/14/2007 Exit[employer] Patient Information 2012 [employer]. 

## 2011-07-21 ENCOUNTER — Telehealth: Payer: Self-pay

## 2011-07-21 ENCOUNTER — Telehealth: Payer: Self-pay | Admitting: *Deleted

## 2011-07-21 MED ORDER — ALPRAZOLAM 1 MG PO TABS: 1.0000 mg | ORAL_TABLET | Freq: Every day | ORAL | Status: AC | PRN

## 2011-07-21 MED ORDER — TIZANIDINE HCL 4 MG PO TABS: 4.0000 mg | ORAL_TABLET | Freq: Three times a day (TID) | ORAL | Status: DC | PRN

## 2011-07-21 NOTE — Telephone Encounter (Signed)
Faxed hardcopy to pharmacy and informed the patient

## 2011-07-21 NOTE — Telephone Encounter (Signed)
Pharmacy to inform Cyclobenzaprine 5 mg non formulary, please advise alternative

## 2011-07-21 NOTE — Telephone Encounter (Signed)
Done hardcopy to [facility]  

## 2011-07-21 NOTE — Telephone Encounter (Signed)
Received fax pt requesting refill on alprazolam 1 mg. Med is not on pt med list. IS this ok?... 07/21/11@2 :43pm/LMB

## 2011-07-21 NOTE — Telephone Encounter (Signed)
To try tizanidine prn - done per emr

## 2011-07-24 NOTE — Telephone Encounter (Signed)
Called left message to call back 

## 2011-07-24 NOTE — Telephone Encounter (Signed)
Patient informed of medication change

## 2011-07-31 ENCOUNTER — Telehealth: Payer: Self-pay

## 2011-07-31 MED ORDER — METHOCARBAMOL 500 MG PO TABS: 500.0000 mg | ORAL_TABLET | Freq: Three times a day (TID) | ORAL | Status: DC

## 2011-07-31 NOTE — Telephone Encounter (Signed)
Try change to robaxin prn  If this is not covered, pt should inquire with pharmacist about covered option, but if none he should be prepared to consider paying cash due to his particular insurance not covering this class of medication

## 2011-07-31 NOTE — Telephone Encounter (Signed)
Cyclobenzaprine and Tizanidine not on formulary, please advise.

## 2011-07-31 NOTE — Telephone Encounter (Signed)
Cyclobenzaprine and Tizanidine not on formulary

## 2011-08-01 NOTE — Telephone Encounter (Signed)
Patient informed. 

## 2011-08-04 ENCOUNTER — Other Ambulatory Visit: Payer: Self-pay | Admitting: Internal Medicine

## 2011-08-07 ENCOUNTER — Encounter: Payer: Medicare HMO | Attending: Physical Medicine & Rehabilitation | Admitting: *Deleted

## 2011-08-07 ENCOUNTER — Encounter: Payer: Self-pay | Admitting: *Deleted

## 2011-08-07 VITALS — BP 160/86 | HR 52 | Resp 18 | Ht 73.0 in | Wt 372.0 lb

## 2011-08-07 DIAGNOSIS — M1710 Unilateral primary osteoarthritis, unspecified knee: Secondary | ICD-10-CM | POA: Insufficient documentation

## 2011-08-07 DIAGNOSIS — M79609 Pain in unspecified limb: Secondary | ICD-10-CM | POA: Insufficient documentation

## 2011-08-07 DIAGNOSIS — G894 Chronic pain syndrome: Secondary | ICD-10-CM | POA: Insufficient documentation

## 2011-08-07 DIAGNOSIS — M5137 Other intervertebral disc degeneration, lumbosacral region: Secondary | ICD-10-CM

## 2011-08-07 MED ORDER — OXYCODONE HCL 15 MG PO TABS: 15.0000 mg | ORAL_TABLET | Freq: Four times a day (QID) | ORAL | Status: DC | PRN

## 2011-08-07 NOTE — Progress Notes (Signed)
Reports no change in pain complaint today. Discussed frequency of office visits; originally he was coming in every 3 months. Will clarify with Dr Letta Pate at next visit w/ him in May. No other questions voiced.

## 2011-08-16 ENCOUNTER — Other Ambulatory Visit: Payer: Self-pay | Admitting: Internal Medicine

## 2011-08-22 ENCOUNTER — Other Ambulatory Visit: Payer: Self-pay | Admitting: *Deleted

## 2011-08-22 MED ORDER — TRAMADOL HCL 50 MG PO TABS: 50.0000 mg | ORAL_TABLET | Freq: Two times a day (BID) | ORAL | Status: DC | PRN

## 2011-08-28 ENCOUNTER — Other Ambulatory Visit: Payer: Self-pay | Admitting: Cardiology

## 2011-09-07 ENCOUNTER — Other Ambulatory Visit: Payer: Self-pay | Admitting: *Deleted

## 2011-09-07 MED ORDER — PRASUGREL HCL 10 MG PO TABS: 10.0000 mg | ORAL_TABLET | Freq: Every day | ORAL | Status: DC

## 2011-09-07 NOTE — Telephone Encounter (Signed)
Refilled effient ,needs appointment

## 2011-09-12 ENCOUNTER — Other Ambulatory Visit: Payer: Self-pay | Admitting: Internal Medicine

## 2011-09-12 MED ORDER — ZOLPIDEM TARTRATE 10 MG PO TABS: ORAL_TABLET | ORAL | Status: DC

## 2011-09-12 MED ORDER — CYCLOBENZAPRINE HCL 5 MG PO TABS: 5.0000 mg | ORAL_TABLET | Freq: Three times a day (TID) | ORAL | Status: AC | PRN

## 2011-09-12 NOTE — Telephone Encounter (Signed)
Pt needs an rx for [employer]

## 2011-09-12 NOTE — Telephone Encounter (Signed)
Done per emr 

## 2011-09-13 NOTE — Telephone Encounter (Signed)
Faxed hardcopy to pharmacy. 

## 2011-09-18 ENCOUNTER — Other Ambulatory Visit: Payer: Self-pay | Admitting: Internal Medicine

## 2011-09-21 ENCOUNTER — Other Ambulatory Visit: Payer: Self-pay

## 2011-09-21 MED ORDER — ISOSORBIDE MONONITRATE ER 60 MG PO TB24: ORAL_TABLET | ORAL | Status: DC

## 2011-09-21 MED ORDER — ALPRAZOLAM 1 MG PO TABS: 1.0000 mg | ORAL_TABLET | Freq: Every evening | ORAL | Status: DC | PRN

## 2011-09-21 NOTE — Telephone Encounter (Signed)
Done hardcopy to [facility]  

## 2011-09-21 NOTE — Telephone Encounter (Signed)
Faxed hardcopy to pharmacy. 

## 2011-09-22 ENCOUNTER — Other Ambulatory Visit: Payer: Self-pay | Admitting: *Deleted

## 2011-09-26 ENCOUNTER — Telehealth: Payer: Self-pay | Admitting: Physical Medicine & Rehabilitation

## 2011-09-26 NOTE — Telephone Encounter (Signed)
Pt has appointment on Friday all of his rx's will be refilled then. Pt is aware of this.

## 2011-09-26 NOTE — Telephone Encounter (Signed)
Pt states that he will get rx on Friday at his appointment.

## 2011-09-26 NOTE — Telephone Encounter (Signed)
Patient needs refill on Gabapentin and Tramadol in addition to Oxycodone.

## 2011-09-26 NOTE — Telephone Encounter (Signed)
Patient needs refill on Oxycodone

## 2011-09-29 ENCOUNTER — Encounter: Payer: Self-pay | Admitting: Physical Medicine & Rehabilitation

## 2011-09-29 ENCOUNTER — Encounter: Payer: Medicare HMO | Attending: Physical Medicine & Rehabilitation

## 2011-09-29 ENCOUNTER — Ambulatory Visit (HOSPITAL_BASED_OUTPATIENT_CLINIC_OR_DEPARTMENT_OTHER): Payer: Medicare HMO | Admitting: Physical Medicine & Rehabilitation

## 2011-09-29 VITALS — BP 140/76 | HR 53 | Resp 16 | Wt 371.0 lb

## 2011-09-29 DIAGNOSIS — M542 Cervicalgia: Secondary | ICD-10-CM | POA: Insufficient documentation

## 2011-09-29 DIAGNOSIS — G8929 Other chronic pain: Secondary | ICD-10-CM | POA: Insufficient documentation

## 2011-09-29 DIAGNOSIS — IMO0001 Reserved for inherently not codable concepts without codable children: Secondary | ICD-10-CM | POA: Insufficient documentation

## 2011-09-29 DIAGNOSIS — M51379 Other intervertebral disc degeneration, lumbosacral region without mention of lumbar back pain or lower extremity pain: Secondary | ICD-10-CM | POA: Insufficient documentation

## 2011-09-29 DIAGNOSIS — M48061 Spinal stenosis, lumbar region without neurogenic claudication: Secondary | ICD-10-CM | POA: Insufficient documentation

## 2011-09-29 DIAGNOSIS — M545 Low back pain, unspecified: Secondary | ICD-10-CM

## 2011-09-29 DIAGNOSIS — M5137 Other intervertebral disc degeneration, lumbosacral region: Secondary | ICD-10-CM | POA: Insufficient documentation

## 2011-09-29 DIAGNOSIS — M549 Dorsalgia, unspecified: Secondary | ICD-10-CM | POA: Insufficient documentation

## 2011-09-29 DIAGNOSIS — R209 Unspecified disturbances of skin sensation: Secondary | ICD-10-CM | POA: Insufficient documentation

## 2011-09-29 DIAGNOSIS — M533 Sacrococcygeal disorders, not elsewhere classified: Secondary | ICD-10-CM | POA: Insufficient documentation

## 2011-09-29 MED ORDER — GABAPENTIN 800 MG PO TABS: 800.0000 mg | ORAL_TABLET | Freq: Three times a day (TID) | ORAL | Status: DC

## 2011-09-29 MED ORDER — OXYCODONE HCL 15 MG PO TABS: 15.0000 mg | ORAL_TABLET | Freq: Four times a day (QID) | ORAL | Status: DC | PRN

## 2011-09-29 NOTE — Patient Instructions (Addendum)
Please let us know if you switch your pharmacy PA visit next month We will be checking a urine drug screen today, I would expect to see oxycodone even if your last dose was 2 days ago

## 2011-09-29 NOTE — Progress Notes (Signed)
  Subjective:    Patient ID: Travis [name], male    DOB: 1950-11-30, 61 y.o.   MRN: [id number]  HPI No new medical issues. Ran out of gabapentin several days ago. Has been out of oxycodone 2 days ago. He states that he had almost a full prescription last time I saw him in [place] and then I wrote her another prescription. That carried on through March she had another prescription that he filled in April and now he was out of medication. Last visit was with the nurse on 318 Pain Inventory Average Pain 6 Pain Right Now 7 My pain is constant, sharp, burning and stabbing  In the last 24 hours, has pain interfered with the following? General activity 6 Relation with others 6 Enjoyment of life 7 What TIME of day is your pain at its worst? morning and night Sleep (in general) Poor  Pain is worse with: standing Pain improves with: heat/ice, pacing activities and medication Relief from Meds: 6  Mobility walk without assistance how many minutes can you walk? 30-45 ability to climb steps?  yes do you drive?  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  no  Physicians involved in your care Any changes since last visit?  no       Review of Systems  Musculoskeletal: Positive for back pain and gait problem.  Neurological: Positive for weakness and numbness.  Psychiatric/Behavioral: Positive for dysphoric mood. The patient is nervous/anxious.   All other systems reviewed and are negative.       Objective:   Physical Exam  Constitutional: He is oriented to person, place, and time.  Musculoskeletal:       Right shoulder: He exhibits normal range of motion and no tenderness.       Right ankle: He exhibits deformity.       Valgus deformity right ankle has an AFO with a T-strap  Neurological: He is alert and  oriented to person, place, and time. Gait abnormal.    Orbitally obese male in no acute distress      Assessment & Plan:  1. Lumbar spinal stenosis primarily at L4-L5 level. No neurogenic claudication. 2. Right ankle valgus deformity after trauma. He is using an AFO for this rather than for a foot drop. We'll continue current pain medications Urine drug screen today. He should still have some oxycodone in his system at least metabolites. Resume gabapentin PA visit one month Check prescriber information next month

## 2011-10-04 ENCOUNTER — Encounter: Payer: Self-pay | Admitting: Internal Medicine

## 2011-10-04 ENCOUNTER — Ambulatory Visit (INDEPENDENT_AMBULATORY_CARE_PROVIDER_SITE_OTHER): Payer: Medicare HMO | Admitting: Internal Medicine

## 2011-10-04 VITALS — BP 128/70 | HR 59 | Temp 97.9°F | Ht 73.0 in | Wt 369.1 lb

## 2011-10-04 DIAGNOSIS — F329 Major depressive disorder, single episode, unspecified: Secondary | ICD-10-CM

## 2011-10-04 DIAGNOSIS — R7302 Impaired glucose tolerance (oral): Secondary | ICD-10-CM

## 2011-10-04 DIAGNOSIS — G4700 Insomnia, unspecified: Secondary | ICD-10-CM

## 2011-10-04 DIAGNOSIS — R7309 Other abnormal glucose: Secondary | ICD-10-CM

## 2011-10-04 DIAGNOSIS — I10 Essential (primary) hypertension: Secondary | ICD-10-CM

## 2011-10-04 DIAGNOSIS — E785 Hyperlipidemia, unspecified: Secondary | ICD-10-CM

## 2011-10-04 DIAGNOSIS — Z111 Encounter for screening for respiratory tuberculosis: Secondary | ICD-10-CM

## 2011-10-04 DIAGNOSIS — Z0000 Encounter for general adult medical examination without abnormal findings: Secondary | ICD-10-CM

## 2011-10-04 HISTORY — DX: Insomnia, unspecified: G47.00

## 2011-10-04 MED ORDER — TRAZODONE HCL 50 MG PO TABS: 25.0000 mg | ORAL_TABLET | Freq: Every evening | ORAL | Status: DC | PRN

## 2011-10-04 NOTE — Patient Instructions (Addendum)
Take all new medications as prescribed - the trazodone (sent to [place]);  We are limited to 6 mo total prescription for this type of medication, and has to be renewed after that to continue Continue all other medications as before Your form was filled out today Your TB skin test was place today Please return for Nurse Visit on Friday March 17  Please return in 7 mo with Lab testing done 3-5 days before

## 2011-10-04 NOTE — Progress Notes (Signed)
Subjective:    Patient ID: Travis [name], male    DOB: 1950-07-27, 61 y.o.   MRN: [id number]  HPI  Here to f/u; overall doing ok,  Pt denies chest pain, increased sob or doe, wheezing, orthopnea, PND, increased LE swelling, palpitations, dizziness or syncope.  Pt denies new neurological symptoms such as new headache, or facial or extremity weakness or numbness   Pt denies polydipsia, polyuria, or low sugar symptoms such as weakness or confusion improved with po intake.  Pt states overall good compliance with meds, trying to follow lower cholesterol, diabetic diet, wt overall stable but little exercise however.  Very difficult to lose wt.  Does have sense of ongoing fatigue, but denies signficant hypersomnolence.  But does have signficaint issue with sleeping at night with freq awakening.  Denies worsening depressive symptoms, suicidal ideation, or panic. Past Medical History  Diagnosis Date  . Impaired glucose tolerance 09/27/2010  . HYPERLIPIDEMIA-MIXED 07/15/2009  . DEPRESSION 06/24/2009  . Chronic pain syndrome 10/27/2009  . HYPERTENSION 06/24/2009  . CORONARY ARTERY DISEASE 06/24/2009  . URETHRAL STRICTURE 06/24/2009  . DEGENERATIVE JOINT DISEASE 06/24/2009  . SHOULDER PAIN, BILATERAL 06/24/2009  . ANKLE PAIN, RIGHT 06/24/2009  . [place] DISEASE, LUMBAR 04/19/2010  . SCIATICA, LEFT 04/19/2010  . FATIGUE 06/24/2009  . NEPHROLITHIASIS, HX OF 06/24/2009  . ERECTILE DYSFUNCTION, ORGANIC 05/30/2010  . OSA (obstructive sleep apnea)     not using CPAP very often  . Obesity   . Allergy to clopidogrel   . ALLERGIC RHINITIS 06/24/2009   Past Surgical History  Procedure Date  . Tonsillectomy   . Rotator cuff repair     bilateral  . S/p knee replacement 2008  . S/p bilat wrist surgury   . S/p urethral dilations, multi 2009,2010  . Carpal tunnel release   . S/p right knee arthroscopy 03/2005    and multi prior  . S/p umbilical hernia   . S/p left knee arthroscopy june 2002    multi prior  . S/p right shoulder  rotater cuff surgury april 2011    Dr. [name] Lighter    reports that he quit smoking about 13 months ago. His smoking use included Cigars. He has never used smokeless tobacco. He reports that he drinks alcohol. He reports that he does not use illicit drugs. family history includes Cancer in his father and mother; Coronary artery disease in his other; Depression in his brother, mother, and other; Diabetes in his father; Early death in his paternal grandfather; Early death (age of onset:65) in his maternal grandfather; Heart disease in his father, maternal grandfather, and mother; Hyperlipidemia in his father; and Hypertension in his brother, father, mother, and other. No Known Allergies Current Outpatient Prescriptions on File Prior to Visit  Medication Sig Dispense Refill  . ALPRAZolam (XANAX) 1 MG tablet Take 1 tablet (1 mg total) by mouth at bedtime as needed for sleep.  30 tablet  1  . amLODipine (NORVASC) 5 MG tablet Take 1 tablet (5 mg total) by mouth daily.  90 tablet  2  . aspirin 81 MG tablet Take 81 mg by mouth daily.        [name] atenolol (TENORMIN) 25 MG tablet Take 1 tablet (25 mg total) by mouth daily.  90 tablet  2  . atorvastatin (LIPITOR) 20 MG tablet TAKE ONE TABLET BY MOUTH ONE TIME DAILY  90 tablet  3  . benazepril (LOTENSIN) 20 MG tablet Take 1 tablet (20 mg total) by mouth daily.  90 tablet  2  . benzonatate (TESSALON PERLES) 100 MG capsule 1-2 tabs by mouth three times per day as needed for cough  90 capsule  1  . buPROPion (WELLBUTRIN XL) 300 MG 24 hr tablet Take 1 tablet (300 mg total) by mouth daily.  30 tablet  6  . diclofenac sodium (VOLTAREN) 1 % GEL Apply 1 application topically 3 (three) times daily.        [name] FLUoxetine (PROZAC) 20 MG capsule TAKE ONE CAPSULE BY MOUTH ONE TIME DAILY  30 capsule  6  . fluticasone (FLONASE) 50 MCG/ACT nasal spray USE 2 SPRAYS INTO EACH NOSTRIL ONCE DAILY  16 g  11  . gabapentin (NEURONTIN) 800 MG tablet Take 1 tablet (800 mg total) by mouth 3  (three) times daily.  90 tablet  5  . indomethacin (INDOCIN) 25 MG capsule TAKE ONE CAPSULE BY MOUTH FOUR TIMES DAILY  120 capsule  6  . isosorbide mononitrate (IMDUR) 60 MG 24 hr tablet Take 1 1/2 tablet by mouth daily  45 tablet  1  . LIDODERM 5 % APPLY 1 PATCH THREE TIMES DAILY AS NEEDED FOR PAIN  90 each  5  . mirtazapine (REMERON) 30 MG tablet TAKE 1 TABLET BY MOUTH AT BEDTIME  90 tablet  3  . nitrofurantoin (MACRODANTIN) 50 MG capsule Take 50 mg by mouth daily.        . nitroGLYCERIN (NITROSTAT) 0.3 MG SL tablet Use as directed as needed       . oxyCODONE (ROXICODONE) 15 MG immediate release tablet Take 1 tablet (15 mg total) by mouth every 6 (six) hours as needed for pain. 1 by mouth five times a day as needed  150 tablet  0  . prasugrel (EFFIENT) 10 MG TABS Take 1 tablet (10 mg total) by mouth daily.  30 tablet  1  . traMADol (ULTRAM) 50 MG tablet Take 1 tablet (50 mg total) by mouth 2 (two) times daily as needed for pain.  60 tablet  1  . vardenafil (LEVITRA) 20 MG tablet Take 20 mg by mouth every other day.        . zolpidem (AMBIEN) 10 MG tablet 2 tabs by mouth at night as needed for sleep  180 tablet  1  . traZODone (DESYREL) 50 MG tablet Take 0.5-1 tablets (25-50 mg total) by mouth at bedtime as needed for sleep.  90 tablet  1   Review of Systems Review of Systems  Constitutional: Negative for diaphoresis and unexpected weight change.  HENT: Negative for drooling and tinnitus.   Eyes: Negative for photophobia and visual disturbance.  Respiratory: Negative for choking and stridor.   Gastrointestinal: Negative for vomiting and blood in stool.  Genitourinary: Negative for hematuria and decreased urine volume.  Musculoskeletal: Negative for gait problem.  Skin: Negative for color change and wound.     Objective:   Physical Exam BP 128/70  Pulse 59  Temp(Src) 97.9 F (36.6 C) (Oral)  Ht 6' 1"  (1.854 m)  Wt 369 lb 2 oz (167.434 kg)  BMI 48.70 kg/m2  SpO2 96% Physical Exam   VS noted Constitutional: Pt appears well-developed and well-nourished.  HENT: Head: Normocephalic.  Right Ear: External ear normal.  Left Ear: External ear normal.  Eyes: Conjunctivae and EOM are normal. Pupils are equal, round, and reactive to light.  Neck: Normal range of motion. Neck supple.  Cardiovascular: Normal rate and regular rhythm.   Pulmonary/Chest: Effort normal and breath sounds normal.  Neurological: Pt is alert.  Not confused Skin: Skin is warm. No erythema.  Psychiatric: Pt behavior is normal. Thought content normal. 1+ nervous    Assessment & Plan:

## 2011-10-06 LAB — TB SKIN TEST: TB Skin Test: NEGATIVE mm

## 2011-10-07 ENCOUNTER — Encounter: Payer: Self-pay | Admitting: Internal Medicine

## 2011-10-07 NOTE — Assessment & Plan Note (Signed)
stable overall by hx and exam, most recent data reviewed with pt, and pt to continue medical treatment as before BP Readings from Last 3 Encounters:  10/04/11 128/70  09/29/11 140/76  08/07/11 160/86

## 2011-10-07 NOTE — Assessment & Plan Note (Signed)
stable overall by hx and exam, most recent data reviewed with pt, and pt to continue medical treatment as before Lab Results  Component Value Date   LDLCALC 44 05/24/2011

## 2011-10-07 NOTE — Assessment & Plan Note (Signed)
[place] for trazodone qhs prn, to f/u any worsening symptoms or concerns

## 2011-10-07 NOTE — Assessment & Plan Note (Signed)
stable overall by hx and exam, most recent data reviewed with pt, and pt to continue medical treatment as before Lab Results  Component Value Date   WBC 4.6 05/24/2011   HGB 12.7* 05/24/2011   HCT 37.0* 05/24/2011   PLT 222.0 05/24/2011   GLUCOSE 143* 05/24/2011   CHOL 88 05/24/2011   TRIG 63.0 05/24/2011   HDL 31.90* 05/24/2011   LDLCALC 44 05/24/2011   ALT 42 05/24/2011   AST 30 05/24/2011   NA 142 05/24/2011   K 4.7 05/24/2011   CL 111 05/24/2011   CREATININE 1.0 05/24/2011   BUN 21 05/24/2011   CO2 26 05/24/2011   TSH 1.39 05/24/2011   PSA 0.48 05/24/2011   INR 1.06 08/26/2009   HGBA1C 6.6* 05/24/2011

## 2011-10-07 NOTE — Assessment & Plan Note (Addendum)
stable overall by hx and exam, most recent data reviewed with pt, and pt to continue medical treatment as before Lab Results  Component Value Date   HGBA1C 6.6* 05/24/2011

## 2011-10-18 ENCOUNTER — Telehealth: Payer: Self-pay | Admitting: Cardiology

## 2011-10-18 NOTE — Telephone Encounter (Signed)
Spoke with pt. Pt called about Effient. Pt asking if he can stop Effient or change to another medication. During our conversation pt mentioned that he had been meaning to call the office for an appt. Pt states he has had chest pressure similar to symptoms prior to prior PCI. Pt states the last time he had chest pressure [place] the movie theatre. He went home and took 1 NTG with relief. Pt states he has not had any chest pressure since Monday. I offered pt an appt tomorrow but he declined. I have given him an appt 10/20/11 at 8 am  with Kathrene Alu. Pt states he will continue Effient for now.

## 2011-10-18 NOTE — Telephone Encounter (Signed)
New msg Pt is taking effient. He is having side effects from this med please call her back

## 2011-10-20 ENCOUNTER — Encounter: Payer: Self-pay | Admitting: Nurse Practitioner

## 2011-10-20 ENCOUNTER — Ambulatory Visit
Admission: RE | Admit: 2011-10-20 | Discharge: 2011-10-20 | Disposition: A | Discharge status: Home / Self Care | Payer: Medicare HMO | Source: Ambulatory Visit | Attending: Nurse Practitioner | Admitting: Nurse Practitioner

## 2011-10-20 ENCOUNTER — Ambulatory Visit (INDEPENDENT_AMBULATORY_CARE_PROVIDER_SITE_OTHER): Payer: Medicare HMO | Admitting: Nurse Practitioner

## 2011-10-20 ENCOUNTER — Other Ambulatory Visit: Payer: Self-pay | Admitting: Nurse Practitioner

## 2011-10-20 VITALS — BP 135/70 | HR 40 | Ht 73.0 in | Wt 368.0 lb

## 2011-10-20 DIAGNOSIS — R001 Bradycardia, unspecified: Secondary | ICD-10-CM

## 2011-10-20 DIAGNOSIS — R079 Chest pain, unspecified: Secondary | ICD-10-CM

## 2011-10-20 DIAGNOSIS — Z01818 Encounter for other preprocedural examination: Secondary | ICD-10-CM

## 2011-10-20 DIAGNOSIS — E785 Hyperlipidemia, unspecified: Secondary | ICD-10-CM

## 2011-10-20 DIAGNOSIS — I498 Other specified cardiac arrhythmias: Secondary | ICD-10-CM

## 2011-10-20 DIAGNOSIS — I2510 Atherosclerotic heart disease of native coronary artery without angina pectoris: Secondary | ICD-10-CM

## 2011-10-20 LAB — LIPID PANEL
Cholesterol: 85 mg/dL (ref 0–200)
HDL: 36.7 mg/dL — ABNORMAL LOW (ref >39.00)
LDL Cholesterol: 40 mg/dL (ref 0–99)
Total CHOL/HDL Ratio: 2
Triglycerides: 44 mg/dL (ref 0.0–149.0)
VLDL: 8.8 mg/dL (ref 0.0–40.0)

## 2011-10-20 LAB — BASIC METABOLIC PANEL
BUN: 19 mg/dL (ref 6–23)
CO2: 25 mEq/L (ref 19–32)
Calcium: 8.8 mg/dL (ref 8.4–10.5)
Chloride: 109 mEq/L (ref 96–112)
Creatinine, Ser: 1 mg/dL (ref 0.4–1.5)
GFR: 86.13 mL/min (ref >60.00)
Glucose, Bld: 165 mg/dL — ABNORMAL HIGH (ref 70–99)
Potassium: 4.1 mEq/L (ref 3.5–5.1)
Sodium: 140 mEq/L (ref 135–145)

## 2011-10-20 LAB — HEPATIC FUNCTION PANEL
ALT: 19 U/L (ref 0–53)
AST: 18 U/L (ref 0–37)
Albumin: 3.9 g/dL (ref 3.5–5.2)
Alkaline Phosphatase: 77 U/L (ref 39–117)
Bilirubin, Direct: 0.1 mg/dL (ref 0.0–0.3)
Total Bilirubin: 0.6 mg/dL (ref 0.3–1.2)
Total Protein: 6.4 g/dL (ref 6.0–8.3)

## 2011-10-20 LAB — CBC WITH DIFFERENTIAL/PLATELET
Basophils Absolute: 0 10*3/uL (ref 0.0–0.1)
Basophils Relative: 0.7 % (ref 0.0–3.0)
Eosinophils Absolute: 0.1 10*3/uL (ref 0.0–0.7)
Eosinophils Relative: 2.1 % (ref 0.0–5.0)
HCT: 37.5 % — ABNORMAL LOW (ref 39.0–52.0)
Hemoglobin: 12.5 g/dL — ABNORMAL LOW (ref 13.0–17.0)
Lymphocytes Relative: 26.6 % (ref 12.0–46.0)
Lymphs Abs: 1.3 10*3/uL (ref 0.7–4.0)
MCHC: 33.4 g/dL (ref 30.0–36.0)
MCV: 90.6 fl (ref 78.0–100.0)
Monocytes Absolute: 0.4 10*3/uL (ref 0.1–1.0)
Monocytes Relative: 7.4 % (ref 3.0–12.0)
Neutro Abs: 3.2 10*3/uL (ref 1.4–7.7)
Neutrophils Relative %: 63.2 % (ref 43.0–77.0)
Platelets: 220 10*3/uL (ref 150.0–400.0)
RBC: 4.14 Mil/uL — ABNORMAL LOW (ref 4.22–5.81)
RDW: 14 % (ref 11.5–14.6)
WBC: 5 10*3/uL (ref 4.5–10.5)

## 2011-10-20 LAB — APTT: aPTT: 29 s — ABNORMAL HIGH (ref 21.7–28.8)

## 2011-10-20 LAB — PROTIME-INR
INR: 1.1 ratio — ABNORMAL HIGH (ref 0.8–1.0)
Prothrombin Time: 12.4 s (ref 10.2–12.4)

## 2011-10-20 MED ORDER — NITROGLYCERIN 0.4 MG SL SUBL: 0.4000 mg | SUBLINGUAL_TABLET | SUBLINGUAL | Status: AC | PRN

## 2011-10-20 NOTE — Patient Instructions (Addendum)
We are going to arrange for a cardiac catheterization  We need to check labs today and send you for a CXR.  Go to [place] at the [facility] on the first floor for your CXR  You are scheduled for an outpatient cardiac catheterization on Tuesday, June 4th with Dr. Martin[place] or Dr. Angelena Form or associates.  Go the [facility] at [facility] on Tuesday, June 4th at 9:30am Call the [place] at [phone] if you are unable to make your appointment.  The code to get into the parking garage under the building is 0700. You must have someone available to drive you home. Someone needs to be with you for the first 24 hours after you arrive home. Please wear clothes that are easy to get on and off.  Do not eat or drink after midnight on Monday. You may have water only with your medications on the morning of your procedure.

## 2011-10-20 NOTE — Assessment & Plan Note (Signed)
Patient presents with recurrent chest pain. Using more NTG with and without a response. Some relief with Gi meds. Has extensive CAD with a total of 8 stents. I do not feel that given his size, that stress testing is going to be beneficial. Will go ahead and arrange for cardiac catheterization next week. The procedure has been reviewed in detail. He is willing to proceed. I have refilled his NTG today as well. Labs are checked and CXR will be updated. Patient is agreeable to this plan and will call if any problems develop in the interim.

## 2011-10-20 NOTE — Assessment & Plan Note (Signed)
His rate was 48 during my exam. He is not symptomatic. I have left him on his beta blocker for now. Will proceed on with cardiac catheterization.

## 2011-10-20 NOTE — Progress Notes (Signed)
Travis [name] Date of Birth: 1950-05-27 Medical Record #[id number]  History of Present Illness: Travis [name] is seen today for a work in visit. He is seen for Dr. Aundra Dubin. He is a 61 year old male with known CAD, s/p multiple PCIs in the setting of obesity, OSA, HTN, HLD and DJD. He has never had an MI. He has a total of 8 stents. In 2008 he had a Taxus DES to the mild LAD, Endeavor DES to mid LCX and distal LCX. In January 2009 he had DES to distal LCX, mid LCX and proximal LCX. In November 2009 had BMS x 2 to the mid RCA. His last Carlton Adam was in March of 2011 and was felt to be low risk. EF was 50%.   He comes in today. He called earlier in the week. The call was originally to discuss stopping his Effient. Apparently his insurance company wants him to stop his Effient due to cost. He is Plavix allergic. During the conversation with Travis [name], he mentioned that he had been having chest pressure similar to his prior chest pain syndrome. He has used some NTG with relief.  He was asked to make an appointment. He tells me that he has not really felt well for at least a month. Less energy. Has started having chest pressure that occurs with and without exertion. Using NTG now which he hadn't been doing. NTG brings relief but sometimes he has to use Gas X or Tums. Some diaphoresis noted as well. He feels like something is not right with him. His symptoms are not getting worse but not getting better either. He remains quite obese and I think stress testing would be limited. He says he is not diabetic. 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 He says he has not been dizzy or lightheaded. No syncope reported.   Current Outpatient Prescriptions on File Prior to Visit  Medication Sig Dispense Refill  . ALPRAZolam (XANAX) 1 MG tablet Take 1 tablet (1 mg total) by mouth at bedtime as needed for sleep.  30 tablet  1  .  amLODipine (NORVASC) 5 MG tablet Take 1 tablet (5 mg total) by mouth daily.  90 tablet  2  . aspirin 81 MG tablet Take 81 mg by mouth daily.        [name] atenolol (TENORMIN) 25 MG tablet Take 1 tablet (25 mg total) by mouth daily.  90 tablet  2  . atorvastatin (LIPITOR) 20 MG tablet TAKE ONE TABLET BY MOUTH ONE TIME DAILY  90 tablet  3  . benazepril (LOTENSIN) 20 MG tablet Take 1 tablet (20 mg total) by mouth daily.  90 tablet  2  . benzonatate (TESSALON PERLES) 100 MG capsule 1-2 tabs by mouth three times per day as needed for cough  90 capsule  1  . buPROPion (WELLBUTRIN XL) 300 MG 24 hr tablet Take 1 tablet (300 mg total) by mouth daily.  30 tablet  6  . FLUoxetine (PROZAC) 20 MG capsule TAKE ONE CAPSULE BY MOUTH ONE TIME DAILY  30 capsule  6  . fluticasone (FLONASE) 50 MCG/ACT nasal spray USE 2 SPRAYS INTO EACH NOSTRIL ONCE DAILY  16 g  11  . gabapentin (NEURONTIN) 800 MG tablet Take 1 tablet (800 mg total) by mouth 3 (three) times daily.  90 tablet  5  . indomethacin (INDOCIN) 25 MG capsule TAKE ONE CAPSULE  BY MOUTH FOUR TIMES DAILY  120 capsule  6  . isosorbide mononitrate (IMDUR) 60 MG 24 hr tablet Take 1 1/2 tablet by mouth daily  45 tablet  1  . nitroGLYCERIN (NITROSTAT) 0.3 MG SL tablet Use as directed as needed       . oxyCODONE (ROXICODONE) 15 MG immediate release tablet Take 1 tablet (15 mg total) by mouth every 6 (six) hours as needed for pain. 1 by mouth five times a day as needed  150 tablet  0  . prasugrel (EFFIENT) 10 MG TABS Take 1 tablet (10 mg total) by mouth daily.  30 tablet  1  . traMADol (ULTRAM) 50 MG tablet Take 1 tablet (50 mg total) by mouth 2 (two) times daily as needed for pain.  60 tablet  1  . traZODone (DESYREL) 50 MG tablet Take 0.5-1 tablets (25-50 mg total) by mouth at bedtime as needed for sleep.  90 tablet  1  . vardenafil (LEVITRA) 20 MG tablet Take 20 mg by mouth every other day.        . diclofenac sodium (VOLTAREN) 1 % GEL Apply 1 application topically 3  (three) times daily.        [name] LIDODERM 5 % APPLY 1 PATCH THREE TIMES DAILY AS NEEDED FOR PAIN  90 each  5  . mirtazapine (REMERON) 30 MG tablet TAKE 1 TABLET BY MOUTH AT BEDTIME  90 tablet  3    No Known Allergies  Past Medical History  Diagnosis Date  . Impaired glucose tolerance 09/27/2010  . HYPERLIPIDEMIA-MIXED 07/15/2009  . DEPRESSION 06/24/2009  . Chronic pain syndrome 10/27/2009  . HYPERTENSION 06/24/2009  . CORONARY ARTERY DISEASE 06/24/2009    s/p multiple PCIs  . URETHRAL STRICTURE 06/24/2009  . DEGENERATIVE JOINT DISEASE 06/24/2009  . SHOULDER PAIN, BILATERAL 06/24/2009  . ANKLE PAIN, RIGHT 06/24/2009  . [place] DISEASE, LUMBAR 04/19/2010  . SCIATICA, LEFT 04/19/2010  . FATIGUE 06/24/2009  . NEPHROLITHIASIS, HX OF 06/24/2009  . ERECTILE DYSFUNCTION, ORGANIC 05/30/2010  . OSA (obstructive sleep apnea)     not using CPAP very often  . Obesity   . Allergy to clopidogrel   . ALLERGIC RHINITIS 06/24/2009    Past Surgical History  Procedure Date  . Tonsillectomy   . Rotator cuff repair     bilateral  . S/p knee replacement 2008  . S/p bilat wrist surgury   . S/p urethral dilations, multi 2009,2010  . Carpal tunnel release   . S/p right knee arthroscopy 03/2005    and multi prior  . S/p umbilical hernia   . S/p left knee arthroscopy june 2002    multi prior  . S/p right shoulder rotater cuff surgury april 2011    Dr. [name] Lighter    History  Smoking status  . Former Smoker  . Types: Cigars  . Quit date: 08/28/2010  Smokeless tobacco  . Never Used  Comment: rare    History  Alcohol Use  . 0.0 oz/week    occasional    Family History  Problem Relation Age of Onset  . Depression Mother   . Heart disease Mother   . Hypertension Mother   . Cancer Mother     Breast  . Diabetes Father   . Heart disease Father     CABG  . Cancer Father     prostate and skin cancer  . Hypertension Father   . Hyperlipidemia Father   . Depression Brother     x 2  .  Hypertension Brother     x2    . Coronary artery disease Other   . Hypertension Other   . Depression Other   . Heart disease Maternal Grandfather   . Early death Maternal Grandfather 70    heart attack  . Early death Paternal Grandfather     Review of Systems: The review of systems is positive for chest pressure.  All other systems were reviewed and are negative.  Physical Exam: BP 135/70  Pulse 40  Ht 6' 1"  (1.854 m)  Wt 368 lb (166.924 kg)  BMI 48.55 kg/m2 Patient is very pleasant and in no acute distress. He is morbidly obese. Skin is warm and dry. Color is normal.  HEENT is unremarkable. Normocephalic/atraumatic. PERRL. Sclera are nonicteric. Neck is supple. No masses. No JVD. Lungs are clear. Cardiac exam shows a regular rate and rhythm. Heart rate is 48 by my exam. Abdomen is obese but soft. Extremities are without edema. He has a brace on his right leg. Gait and ROM are intact. No gross neurologic deficits noted.   LABORATORY DATA: EKG today shows sinus bradycardia with a rate of 40.    Assessment / Plan:

## 2011-10-24 ENCOUNTER — Encounter (HOSPITAL_BASED_OUTPATIENT_CLINIC_OR_DEPARTMENT_OTHER)
Admission: RE | Disposition: A | Discharge status: Home / Self Care | Payer: Self-pay | Source: Ambulatory Visit | Attending: Cardiovascular Disease

## 2011-10-24 ENCOUNTER — Inpatient Hospital Stay (HOSPITAL_BASED_OUTPATIENT_CLINIC_OR_DEPARTMENT_OTHER)
Admission: RE | Admit: 2011-10-24 | Discharge: 2011-10-24 | Disposition: A | Discharge status: Home / Self Care | Payer: Medicare HMO | Source: Ambulatory Visit | Attending: Cardiovascular Disease | Admitting: Cardiovascular Disease

## 2011-10-24 DIAGNOSIS — I2510 Atherosclerotic heart disease of native coronary artery without angina pectoris: Secondary | ICD-10-CM | POA: Insufficient documentation

## 2011-10-24 DIAGNOSIS — G4733 Obstructive sleep apnea (adult) (pediatric): Secondary | ICD-10-CM | POA: Insufficient documentation

## 2011-10-24 DIAGNOSIS — I10 Essential (primary) hypertension: Secondary | ICD-10-CM | POA: Insufficient documentation

## 2011-10-24 DIAGNOSIS — M1990 Unspecified osteoarthritis, unspecified site: Secondary | ICD-10-CM | POA: Insufficient documentation

## 2011-10-24 DIAGNOSIS — E785 Hyperlipidemia, unspecified: Secondary | ICD-10-CM | POA: Insufficient documentation

## 2011-10-24 DIAGNOSIS — Z01818 Encounter for other preprocedural examination: Secondary | ICD-10-CM

## 2011-10-24 DIAGNOSIS — Z9861 Coronary angioplasty status: Secondary | ICD-10-CM | POA: Insufficient documentation

## 2011-10-24 DIAGNOSIS — R0789 Other chest pain: Secondary | ICD-10-CM | POA: Insufficient documentation

## 2011-10-24 DIAGNOSIS — E669 Obesity, unspecified: Secondary | ICD-10-CM | POA: Insufficient documentation

## 2011-10-24 DIAGNOSIS — R079 Chest pain, unspecified: Secondary | ICD-10-CM

## 2011-10-24 SURGERY — JV LEFT HEART CATHETERIZATION WITH CORONARY ANGIOGRAM
Anesthesia: Moderate Sedation

## 2011-10-24 MED ORDER — SODIUM CHLORIDE 0.9 % IV SOLN: 250.0000 mL | INTRAVENOUS | Status: DC | PRN

## 2011-10-24 MED ORDER — NITROGLYCERIN 0.4 MG/SPRAY TL SOLN
1.0000 | Status: DC | PRN
  Administered 2011-10-24 (×2): 1 via SUBLINGUAL

## 2011-10-24 MED ORDER — SODIUM CHLORIDE 0.9 % IV SOLN: INTRAVENOUS | Status: DC

## 2011-10-24 MED ORDER — DIAZEPAM 5 MG PO TABS
10.0000 mg | ORAL_TABLET | ORAL | Status: AC
  Administered 2011-10-24: 10 mg via ORAL

## 2011-10-24 MED ORDER — ASPIRIN 81 MG PO CHEW
324.0000 mg | CHEWABLE_TABLET | ORAL | Status: AC
  Administered 2011-10-24: 324 mg via ORAL

## 2011-10-24 MED ORDER — SODIUM CHLORIDE 0.9 % IJ SOLN: 3.0000 mL | Freq: Two times a day (BID) | INTRAMUSCULAR | Status: DC

## 2011-10-24 MED ORDER — ACETAMINOPHEN 325 MG PO TABS: 650.0000 mg | ORAL_TABLET | ORAL | Status: DC | PRN

## 2011-10-24 MED ORDER — SODIUM CHLORIDE 0.9 % IJ SOLN: 3.0000 mL | INTRAMUSCULAR | Status: DC | PRN

## 2011-10-24 NOTE — CV Procedure (Signed)
    Cardiac Catheterization Operative Report  Travis [name] [id number] 6/4/201311:28 AM [name], MD, MD  Procedure Performed:  1. Left Heart Catheterization 2. Selective Coronary Angiography 3. Left ventricular angiogram   Operator: Lauree Chandler, MD  Arterial access site:  Right radial artery.   Indication:  Chest pain in a patient with known CAD with multiple stenting procedures in the past in [place]. No recent cardiac workup.                                   Procedure Details: The risks, benefits, complications, treatment options, and expected outcomes were discussed with the patient. The patient and/or family concurred with the proposed plan, giving informed consent. The patient was brought to the cath lab after IV hydration was begun and oral premedication was given. The patient was further sedated with Versed and Fentanyl. The right wrist was assessed with an Allens test which was positive. The right wrist was prepped and draped in a sterile fashion. 1% lidocaine was used for local anesthesia. Using the modified Seldinger access technique, a 5 French sheath was placed in the right radial artery. 2.5 mg Nicardipine was given through the sheath. 6000 units IV heparin was given. Standard diagnostic catheters were used to perform selective coronary angiography. A pigtail catheter was used to perform a left ventricular angiogram. The sheath was removed from the right radial artery and a Terumo hemostasis band was applied at the arteriotomy site on the right wrist.   There were no immediate complications. The patient was taken to the recovery area in stable condition.   Hemodynamic Findings: Central aortic pressure: 99/49 Left ventricular pressure: 99/12/20  Angiographic Findings:  Left main: No obstructive disease.   Left Anterior Descending Artery: Large vessel that courses to the apex. There is a patent stent in the mid vessel. There are minor luminal  irregularities in the mid and distal vessel.   Circumflex Artery: Moderate sized vessel with patents stents in the proximal and mid vessel and in the distal OM branch. No obstructive disease noted.   Right Coronary Artery: Moderate sized dominant vessel with patent proximal stents. No restenosis. Mild plaque in the mid vessel.   Left Ventricular Angiogram: LVEF 55%.  Impression: 1.  Triple vessel CAD with patent stents in the RCA, LAD and Circumflex 2.  Normal LV systolic function 3.  Non-cardiac chest pain  Recommendations: Continue medical management. Will add Pepcid 20 mg po Qdaily for possible GERD. If symptoms persist, may need to consider PPI and GI workup.        Complications:  None. The patient tolerated the procedure well.

## 2011-10-24 NOTE — OR Nursing (Signed)
Discharge instructions reviewed and signed, pt stated understanding, ambulated in hall without difficulty, site level 0, transported to friend's car via [employer]

## 2011-10-24 NOTE — H&P (View-Only) (Signed)
Travis [name] Date of Birth: 1950-12-15 Medical Record #[id number]  History of Present Illness: Travis [name] is seen today for a work in visit. He is seen for Dr. Aundra Dubin. He is a 61 year old male with known CAD, s/p multiple PCIs in the setting of obesity, OSA, HTN, HLD and DJD. He has never had an MI. He has a total of 8 stents. In 2008 he had a Taxus DES to the mild LAD, Endeavor DES to mid LCX and distal LCX. In January 2009 he had DES to distal LCX, mid LCX and proximal LCX. In November 2009 had BMS x 2 to the mid RCA. His last Carlton Adam was in March of 2011 and was felt to be low risk. EF was 50%.   He comes in today. He called earlier in the week. The call was originally to discuss stopping his Effient. Apparently his insurance company wants him to stop his Effient due to cost. He is Plavix allergic. During the conversation with Lelon Frohlich, he mentioned that he had been having chest pressure similar to his prior chest pain syndrome. He has used some NTG with relief.  He was asked to make an appointment. He tells me that he has not really felt well for at least a month. Less energy. Has started having chest pressure that occurs with and without exertion. Using NTG now which he hadn't been doing. NTG brings relief but sometimes he has to use Gas X or Tums. Some diaphoresis noted as well. He feels like something is not right with him. His symptoms are not getting worse but not getting better either. He remains quite obese and I think stress testing would be limited. He says he is not diabetic. 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 He says he has not been dizzy or lightheaded. No syncope reported.   Current Outpatient Prescriptions on File Prior to Visit  Medication Sig Dispense Refill  . ALPRAZolam (XANAX) 1 MG tablet Take 1 tablet (1 mg total) by mouth at bedtime as needed for sleep.  30 tablet  1  .  amLODipine (NORVASC) 5 MG tablet Take 1 tablet (5 mg total) by mouth daily.  90 tablet  2  . aspirin 81 MG tablet Take 81 mg by mouth daily.        [name] atenolol (TENORMIN) 25 MG tablet Take 1 tablet (25 mg total) by mouth daily.  90 tablet  2  . atorvastatin (LIPITOR) 20 MG tablet TAKE ONE TABLET BY MOUTH ONE TIME DAILY  90 tablet  3  . benazepril (LOTENSIN) 20 MG tablet Take 1 tablet (20 mg total) by mouth daily.  90 tablet  2  . benzonatate (TESSALON PERLES) 100 MG capsule 1-2 tabs by mouth three times per day as needed for cough  90 capsule  1  . buPROPion (WELLBUTRIN XL) 300 MG 24 hr tablet Take 1 tablet (300 mg total) by mouth daily.  30 tablet  6  . FLUoxetine (PROZAC) 20 MG capsule TAKE ONE CAPSULE BY MOUTH ONE TIME DAILY  30 capsule  6  . fluticasone (FLONASE) 50 MCG/ACT nasal spray USE 2 SPRAYS INTO EACH NOSTRIL ONCE DAILY  16 g  11  . gabapentin (NEURONTIN) 800 MG tablet Take 1 tablet (800 mg total) by mouth 3 (three) times daily.  90 tablet  5  . indomethacin (INDOCIN) 25 MG capsule TAKE ONE CAPSULE  BY MOUTH FOUR TIMES DAILY  120 capsule  6  . isosorbide mononitrate (IMDUR) 60 MG 24 hr tablet Take 1 1/2 tablet by mouth daily  45 tablet  1  . nitroGLYCERIN (NITROSTAT) 0.3 MG SL tablet Use as directed as needed       . oxyCODONE (ROXICODONE) 15 MG immediate release tablet Take 1 tablet (15 mg total) by mouth every 6 (six) hours as needed for pain. 1 by mouth five times a day as needed  150 tablet  0  . prasugrel (EFFIENT) 10 MG TABS Take 1 tablet (10 mg total) by mouth daily.  30 tablet  1  . traMADol (ULTRAM) 50 MG tablet Take 1 tablet (50 mg total) by mouth 2 (two) times daily as needed for pain.  60 tablet  1  . traZODone (DESYREL) 50 MG tablet Take 0.5-1 tablets (25-50 mg total) by mouth at bedtime as needed for sleep.  90 tablet  1  . vardenafil (LEVITRA) 20 MG tablet Take 20 mg by mouth every other day.        . diclofenac sodium (VOLTAREN) 1 % GEL Apply 1 application topically 3  (three) times daily.        [name] LIDODERM 5 % APPLY 1 PATCH THREE TIMES DAILY AS NEEDED FOR PAIN  90 each  5  . mirtazapine (REMERON) 30 MG tablet TAKE 1 TABLET BY MOUTH AT BEDTIME  90 tablet  3    No Known Allergies  Past Medical History  Diagnosis Date  . Impaired glucose tolerance 09/27/2010  . HYPERLIPIDEMIA-MIXED 07/15/2009  . DEPRESSION 06/24/2009  . Chronic pain syndrome 10/27/2009  . HYPERTENSION 06/24/2009  . CORONARY ARTERY DISEASE 06/24/2009    s/p multiple PCIs  . URETHRAL STRICTURE 06/24/2009  . DEGENERATIVE JOINT DISEASE 06/24/2009  . SHOULDER PAIN, BILATERAL 06/24/2009  . ANKLE PAIN, RIGHT 06/24/2009  . [place] DISEASE, LUMBAR 04/19/2010  . SCIATICA, LEFT 04/19/2010  . FATIGUE 06/24/2009  . NEPHROLITHIASIS, HX OF 06/24/2009  . ERECTILE DYSFUNCTION, ORGANIC 05/30/2010  . OSA (obstructive sleep apnea)     not using CPAP very often  . Obesity   . Allergy to clopidogrel   . ALLERGIC RHINITIS 06/24/2009    Past Surgical History  Procedure Date  . Tonsillectomy   . Rotator cuff repair     bilateral  . S/p knee replacement 2008  . S/p bilat wrist surgury   . S/p urethral dilations, multi 2009,2010  . Carpal tunnel release   . S/p right knee arthroscopy 03/2005    and multi prior  . S/p umbilical hernia   . S/p left knee arthroscopy june 2002    multi prior  . S/p right shoulder rotater cuff surgury april 2011    Dr. [name] Lighter    History  Smoking status  . Former Smoker  . Types: Cigars  . Quit date: 08/28/2010  Smokeless tobacco  . Never Used  Comment: rare    History  Alcohol Use  . 0.0 oz/week    occasional    Family History  Problem Relation Age of Onset  . Depression Mother   . Heart disease Mother   . Hypertension Mother   . Cancer Mother     Breast  . Diabetes Father   . Heart disease Father     CABG  . Cancer Father     prostate and skin cancer  . Hypertension Father   . Hyperlipidemia Father   . Depression Brother     x 2  .  Hypertension Brother     x2    . Coronary artery disease Other   . Hypertension Other   . Depression Other   . Heart disease Maternal Grandfather   . Early death Maternal Grandfather 79    heart attack  . Early death Paternal Grandfather     Review of Systems: The review of systems is positive for chest pressure.  All other systems were reviewed and are negative.  Physical Exam: BP 135/70  Pulse 40  Ht 6' 1"  (1.854 m)  Wt 368 lb (166.924 kg)  BMI 48.55 kg/m2 Patient is very pleasant and in no acute distress. He is morbidly obese. Skin is warm and dry. Color is normal.  HEENT is unremarkable. Normocephalic/atraumatic. PERRL. Sclera are nonicteric. Neck is supple. No masses. No JVD. Lungs are clear. Cardiac exam shows a regular rate and rhythm. Heart rate is 48 by my exam. Abdomen is obese but soft. Extremities are without edema. He has a brace on his right leg. Gait and ROM are intact. No gross neurologic deficits noted.   LABORATORY DATA: EKG today shows sinus bradycardia with a rate of 40.    Assessment / Plan:

## 2011-10-24 NOTE — Discharge Instructions (Signed)
Start Protonix 40 mg po QDaily.

## 2011-10-24 NOTE — Interval H&P Note (Signed)
History and Physical Interval Note:  10/24/2011 10:31 AM  Travis [name]  has presented today for surgery, with the diagnosis of cp  The various methods of treatment have been discussed with the patient and family. After consideration of risks, benefits and other options for treatment, the patient has consented to  Procedure(s) (LRB): JV LEFT HEART CATHETERIZATION WITH CORONARY ANGIOGRAM (N/A) as a surgical intervention .  The patients' history has been reviewed, patient examined, no change in status, stable for surgery.  I have reviewed the patients' chart and labs.  Questions were answered to the patient's satisfaction.     [name]

## 2011-10-27 ENCOUNTER — Telehealth: Payer: Self-pay

## 2011-10-27 MED ORDER — METHOCARBAMOL 500 MG PO TABS: 500.0000 mg | ORAL_TABLET | Freq: Three times a day (TID) | ORAL | Status: AC

## 2011-10-27 NOTE — Telephone Encounter (Signed)
Done per emr 

## 2011-10-27 NOTE — Telephone Encounter (Signed)
Pharmacist requesting refill on Robaxin 500 mg tid, please advise not currently on med. list

## 2011-10-31 ENCOUNTER — Encounter: Payer: Self-pay | Admitting: Physical Medicine and Rehabilitation

## 2011-10-31 ENCOUNTER — Encounter: Payer: Medicare HMO | Attending: Physical Medicine & Rehabilitation | Admitting: Physical Medicine and Rehabilitation

## 2011-10-31 ENCOUNTER — Encounter: Payer: Medicare HMO | Admitting: Physical Medicine and Rehabilitation

## 2011-10-31 VITALS — BP 156/76 | HR 48 | Resp 16 | Ht 73.0 in | Wt 370.4 lb

## 2011-10-31 DIAGNOSIS — G894 Chronic pain syndrome: Secondary | ICD-10-CM | POA: Insufficient documentation

## 2011-10-31 DIAGNOSIS — M25519 Pain in unspecified shoulder: Secondary | ICD-10-CM | POA: Insufficient documentation

## 2011-10-31 DIAGNOSIS — M79609 Pain in unspecified limb: Secondary | ICD-10-CM | POA: Insufficient documentation

## 2011-10-31 DIAGNOSIS — M545 Low back pain, unspecified: Secondary | ICD-10-CM

## 2011-10-31 DIAGNOSIS — M25571 Pain in right ankle and joints of right foot: Secondary | ICD-10-CM

## 2011-10-31 DIAGNOSIS — M1710 Unilateral primary osteoarthritis, unspecified knee: Secondary | ICD-10-CM | POA: Insufficient documentation

## 2011-10-31 DIAGNOSIS — M25569 Pain in unspecified knee: Secondary | ICD-10-CM | POA: Insufficient documentation

## 2011-10-31 DIAGNOSIS — M25579 Pain in unspecified ankle and joints of unspecified foot: Secondary | ICD-10-CM

## 2011-10-31 MED ORDER — OXYCODONE HCL 15 MG PO TABS: 15.0000 mg | ORAL_TABLET | Freq: Four times a day (QID) | ORAL | Status: DC | PRN

## 2011-10-31 NOTE — Progress Notes (Signed)
Subjective:    Patient ID: Travis [name], male    DOB: 08/04/1950, 60 y.o.   MRN: [id number]  HPI The patient reports that the medication is controlling his pain very well. The problem has been stable. He only complains about right TMJ pain, he states that he has a lot of stress in his family and most likely he is grinding his teeth at night.  Pain Inventory Average Pain 6 Pain Right Now 6 My pain is sharp, dull, stabbing and tingling  In the last 24 hours, has pain interfered with the following? General activity 7 Relation with others 8 Enjoyment of life 7 What TIME of day is your pain at its worst? evening Sleep (in general) Poor  Pain is worse with: inactivity, standing and some activites Pain improves with: heat/ice, pacing activities and medication Relief from Meds: 7  Mobility walk with assistance ability to climb steps?  yes do you drive?  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  no  Physicians involved in your care Any changes since last visit?  no   Family History  Problem Relation Age of Onset  . Depression Mother   . Heart disease Mother   . Hypertension Mother   . Cancer Mother     Breast  . Diabetes Father   . Heart disease Father     CABG  . Cancer Father     prostate and skin cancer  . Hypertension Father   . Hyperlipidemia Father   . Depression Brother     x 2  . Hypertension Brother     x2  . Coronary artery disease Other   . Hypertension Other   . Depression Other   . Heart disease Maternal Grandfather   . Early death Maternal Grandfather 29    heart attack  . Early death Paternal Grandfather    History   Social History  . Marital Status: Divorced    Spouse Name: N/A    Number of Children: 2  . Years of Education: N/A   Occupational History  . disabled since 2006 due to ortho. heart,  psych   . part time work as an Multimedia programmer, wrestling, and baseball Agricultural consultant    Social History Main Topics  . Smoking status: Former Smoker    Types: Cigars    Quit date: 08/28/2010  . Smokeless tobacco: Never Used   Comment: rare  . Alcohol Use: 0.0 oz/week     occasional  . Drug Use: No  . Sexually Active: Not Currently   Other Topics Concern  . None   Social History Narrative   Played semi-pro football, used steroidsDivorced moved here from [place], [place]   Past Surgical History  Procedure Date  . Tonsillectomy   . Rotator cuff repair     bilateral  . S/p knee replacement 2008  . S/p bilat wrist surgury   . S/p urethral dilations, multi 2009,2010  . Carpal tunnel release   . S/p right knee arthroscopy 03/2005    and multi prior  . S/p umbilical hernia   . S/p left knee arthroscopy june 2002    multi prior  . S/p right shoulder rotater cuff surgury april 2011    Dr. [name] Lighter   Past Medical History  Diagnosis Date  . Impaired glucose tolerance 09/27/2010  . HYPERLIPIDEMIA-MIXED 07/15/2009  . DEPRESSION 06/24/2009  . Chronic pain  syndrome 10/27/2009  . HYPERTENSION 06/24/2009  . CORONARY ARTERY DISEASE 06/24/2009    s/p multiple PCIs  . URETHRAL STRICTURE 06/24/2009  . DEGENERATIVE JOINT DISEASE 06/24/2009  . SHOULDER PAIN, BILATERAL 06/24/2009  . ANKLE PAIN, RIGHT 06/24/2009  . [place] DISEASE, LUMBAR 04/19/2010  . SCIATICA, LEFT 04/19/2010  . FATIGUE 06/24/2009  . NEPHROLITHIASIS, HX OF 06/24/2009  . ERECTILE DYSFUNCTION, ORGANIC 05/30/2010  . OSA (obstructive sleep apnea)     not using CPAP very often  . Obesity   . Allergy to clopidogrel   . ALLERGIC RHINITIS 06/24/2009   BP 156/76  Pulse 48  Resp 16  Ht 6' 1"  (1.854 m)  Wt 370 lb 6.4 oz (168.012 kg)  BMI 48.87 kg/m2  SpO2 98%    Review of Systems  Constitutional: Positive for diaphoresis and unexpected weight change.  Respiratory: Positive for apnea.   Gastrointestinal: Positive for diarrhea and  constipation.  Musculoskeletal: Positive for back pain and gait problem.  Neurological: Positive for dizziness and weakness.  Psychiatric/Behavioral: Positive for dysphoric mood. The patient is nervous/anxious.   All other systems reviewed and are negative.       Objective:   Physical Exam  Constitutional: He is oriented to person, place, and time. He appears well-developed.       Morbidly obese  HENT:  Head: Normocephalic.  Neck: Neck supple.  Musculoskeletal: He exhibits tenderness.       Right shoulder: He exhibits decreased range of motion, tenderness and pain.       Right knee: tenderness found.       Left knee: tenderness found.       Lumbar back: He exhibits decreased range of motion and tenderness.  Neurological: He is alert and oriented to person, place, and time.  Skin: Skin is warm and dry.  Psychiatric: He has a normal mood and affect.          Assessment & Plan:  This is a 61 year old  male with 1.Chronic LBP 2.Right shoulder pain 3.Bilateral knee pain  Plan : Continue with medication, continue with exercising in the pool. Gave patient 2 prescriptions for his oxycodone, because he, will be in [place] for 2 months. Follow up in 2 months.

## 2011-10-31 NOTE — Patient Instructions (Signed)
Continue with your medication, continue with your walking and swimming program.

## 2011-11-06 ENCOUNTER — Telehealth: Payer: Self-pay | Admitting: Internal Medicine

## 2011-11-06 NOTE — Telephone Encounter (Signed)
Caller: Deacon/Patient; PCP: [name]; CB#: [phone]; ; ; Call regarding Headache;  Pt calling regarding needs appt with Dr. Jenny Reichmann for headache, that started 2 weeks ago, had heart cath 10 days ago and was normal. Has had this headache since chest pain started. Pt states he has chest pain/burning but no change in pain in the past 10 days. Took ASA at 0715, rates HA a 7 when he has this HA. Rates a 6 now spreads to his forehead and ear, thinks he may be getting an ear infection. Afebrile. Denies chest discomfort at this time. Emergent s/s for Headache r/o per protocol except for see in 4hrs due to new tenderness over temporal area. Pts PCP does not have any more appts, offered appt with Dr. Ronnald Ramp at 81 and wants to see his PCP. Message to clinical pool and MD as per office orders.

## 2011-11-06 NOTE — Telephone Encounter (Signed)
Ok to workin June 18

## 2011-11-07 ENCOUNTER — Encounter: Payer: Self-pay | Admitting: Internal Medicine

## 2011-11-07 ENCOUNTER — Ambulatory Visit (INDEPENDENT_AMBULATORY_CARE_PROVIDER_SITE_OTHER): Payer: Medicare HMO | Admitting: Internal Medicine

## 2011-11-07 VITALS — BP 112/72 | HR 47 | Temp 98.5°F | Ht 73.0 in | Wt 371.0 lb

## 2011-11-07 DIAGNOSIS — F329 Major depressive disorder, single episode, unspecified: Secondary | ICD-10-CM

## 2011-11-07 DIAGNOSIS — G4733 Obstructive sleep apnea (adult) (pediatric): Secondary | ICD-10-CM

## 2011-11-07 DIAGNOSIS — F411 Generalized anxiety disorder: Secondary | ICD-10-CM

## 2011-11-07 DIAGNOSIS — F3289 Other specified depressive episodes: Secondary | ICD-10-CM

## 2011-11-07 DIAGNOSIS — I10 Essential (primary) hypertension: Secondary | ICD-10-CM

## 2011-11-07 DIAGNOSIS — F419 Anxiety disorder, unspecified: Secondary | ICD-10-CM

## 2011-11-07 DIAGNOSIS — G4700 Insomnia, unspecified: Secondary | ICD-10-CM

## 2011-11-07 MED ORDER — CLONAZEPAM 1 MG PO TABS: ORAL_TABLET | ORAL | Status: DC

## 2011-11-07 MED ORDER — MIRTAZAPINE 30 MG PO TABS: 15.0000 mg | ORAL_TABLET | Freq: Every day | ORAL | Status: DC

## 2011-11-07 NOTE — Patient Instructions (Addendum)
Please re-start the mirtazipine at 15 mg per day  OK to stop the alprazolam Ok to start the generic Klonopin for nerves as needed only Continue all other medications as before You will be contacted regarding the referral for: pulmonary for sleep apnea followup OK to stop the pantoprazole if not helped so far Remember you may be able to stop buying the [place] cash if the mirtazipine helps enough for sleep

## 2011-11-07 NOTE — Assessment & Plan Note (Signed)
Not used tx or f/u for 4 yrs, needs to re-start - for pulm referral

## 2011-11-08 ENCOUNTER — Encounter: Payer: Medicare HMO | Admitting: Physician Assistant

## 2011-11-12 ENCOUNTER — Encounter: Payer: Self-pay | Admitting: Internal Medicine

## 2011-11-12 DIAGNOSIS — F419 Anxiety disorder, unspecified: Secondary | ICD-10-CM | POA: Insufficient documentation

## 2011-11-12 HISTORY — DX: Anxiety disorder, unspecified: F41.9

## 2011-11-12 NOTE — Assessment & Plan Note (Signed)
To add klonopin asd prn

## 2011-11-12 NOTE — Progress Notes (Signed)
Subjective:    Patient ID: Travis [name], male    DOB: 01/30/1951, 61 y.o.   MRN: [id number]  HPI  Here to f/u;  Has been out of mirtazipine 65m for 2 mo with ongoing stressors and increased anxiety, recurrent HA's, chest pressures not better with protonix, and nightly right sided dull HA's, tender to touch with some swelling but only at night;  alos worsening midl depressive symptoms but no suicidal ideation, or panic.  Alprazolam not really working well.  Has signiicant social stressors, currently has custody of a 183yoniece who ranaway from her parents, now all upset with him, and also not getting along with the girlfriend recent.  Has hx of L5 disc dz, chronic pain, sees pain clinic.  Has marked insomnia, aLorrin Maishelps but has to pay cash since insurance wont cover Pt denies other chest pain, increased sob or doe, wheezing, orthopnea, PND, increased LE swelling, palpitations, dizziness or syncope.  Pt denies new neurological symptoms such as new headache, or facial or extremity weakness or numbness   Pt denies polydipsia, polyuria Past Medical History  Diagnosis Date  . Impaired glucose tolerance 09/27/2010  . HYPERLIPIDEMIA-MIXED 07/15/2009  . DEPRESSION 06/24/2009  . Chronic pain syndrome 10/27/2009  . HYPERTENSION 06/24/2009  . CORONARY ARTERY DISEASE 06/24/2009    s/p multiple PCIs  . URETHRAL STRICTURE 06/24/2009  . DEGENERATIVE JOINT DISEASE 06/24/2009  . SHOULDER PAIN, BILATERAL 06/24/2009  . ANKLE PAIN, RIGHT 06/24/2009  . [place]EASE, LUMBAR 04/19/2010  . SCIATICA, LEFT 04/19/2010  . FATIGUE 06/24/2009  . NEPHROLITHIASIS, HX OF 06/24/2009  . ERECTILE DYSFUNCTION, ORGANIC 05/30/2010  . OSA (obstructive sleep apnea)     not using CPAP very often  . Obesity   . Allergy to clopidogrel   . ALLERGIC RHINITIS 06/24/2009   Past Surgical History  Procedure Date  . Tonsillectomy   . Rotator cuff repair     bilateral  . S/p knee replacement 2008  . S/p bilat wrist surgury   . S/p urethral dilations,  multi 2009,2010  . Carpal tunnel release   . S/p right knee arthroscopy 03/2005    and multi prior  . S/p umbilical hernia   . S/p left knee arthroscopy june 2002    multi prior  . S/p right shoulder rotater cuff surgury april 2011    Dr. GGladstone Lighter   reports that he quit smoking about 14 months ago. His smoking use included Cigars. He has never used smokeless tobacco. He reports that he drinks alcohol. He reports that he does not use illicit drugs. family history includes Cancer in his father and mother; Coronary artery disease in his other; Depression in his brother, mother, and other; Diabetes in his father; Early death in his paternal grandfather; Early death (age of onset:65) in his maternal grandfather; Heart disease in his father, maternal grandfather, and mother; Hyperlipidemia in his father; and Hypertension in his brother, father, mother, and other. No Known Allergies Current Outpatient Prescriptions on File Prior to Visit  Medication Sig Dispense Refill  . amLODipine (NORVASC) 5 MG tablet Take 1 tablet (5 mg total) by mouth daily.  90 tablet  2  . aspirin 81 MG tablet Take 81 mg by mouth daily.        .Marland Kitchenatenolol (TENORMIN) 25 MG tablet Take 1 tablet (25 mg total) by mouth daily.  90 tablet  2  . atorvastatin (LIPITOR) 20 MG tablet TAKE ONE TABLET BY MOUTH ONE TIME DAILY  90 tablet  3  .  benazepril (LOTENSIN) 20 MG tablet Take 1 tablet (20 mg total) by mouth daily.  90 tablet  2  . benzonatate (TESSALON PERLES) 100 MG capsule 1-2 tabs by mouth three times per day as needed for cough  90 capsule  1  . buPROPion (WELLBUTRIN XL) 300 MG 24 hr tablet Take 1 tablet (300 mg total) by mouth daily.  30 tablet  6  . diclofenac sodium (VOLTAREN) 1 % GEL Apply 1 application topically 3 (three) times daily.        [name] FLUoxetine (PROZAC) 20 MG capsule TAKE ONE CAPSULE BY MOUTH ONE TIME DAILY  30 capsule  6  . fluticasone (FLONASE) 50 MCG/ACT nasal spray USE 2 SPRAYS INTO EACH NOSTRIL ONCE DAILY  16  g  11  . gabapentin (NEURONTIN) 800 MG tablet Take 1 tablet (800 mg total) by mouth 3 (three) times daily.  90 tablet  5  . indomethacin (INDOCIN) 25 MG capsule TAKE ONE CAPSULE BY MOUTH FOUR TIMES DAILY  120 capsule  6  . isosorbide mononitrate (IMDUR) 60 MG 24 hr tablet Take 1 1/2 tablet by mouth daily  45 tablet  1  . LIDODERM 5 % APPLY 1 PATCH THREE TIMES DAILY AS NEEDED FOR PAIN  90 each  5  . mirtazapine (REMERON) 30 MG tablet Take 0.5 tablets (15 mg total) by mouth at bedtime.  90 tablet  3  . nitroGLYCERIN (NITROSTAT) 0.4 MG SL tablet Place 1 tablet (0.4 mg total) under the tongue every 5 (five) minutes as needed for chest pain.  25 tablet  3  . oxyCODONE (ROXICODONE) 15 MG immediate release tablet Take 1 tablet (15 mg total) by mouth every 6 (six) hours as needed for pain. 1 by mouth five times a day as needed  150 tablet  0  . prasugrel (EFFIENT) 10 MG TABS Take 1 tablet (10 mg total) by mouth daily.  30 tablet  1  . traMADol (ULTRAM) 50 MG tablet Take 1 tablet (50 mg total) by mouth 2 (two) times daily as needed for pain.  60 tablet  1  . vardenafil (LEVITRA) 20 MG tablet Take 20 mg by mouth every other day. Reports he is rarely using. He is aware that he is on Imdur      . clonazePAM (KLONOPIN) 1 MG tablet 1/2 to 1 tab by mouth twice per day as needed  60 tablet  2  . traZODone (DESYREL) 50 MG tablet Take 0.5-1 tablets (25-50 mg total) by mouth at bedtime as needed for sleep.  90 tablet  1   Review of Systems Review of Systems  Constitutional: Negative for diaphoresis and unexpected weight change.  HENT: Negative for drooling and tinnitus.   Eyes: Negative for photophobia and visual disturbance.  Respiratory: Negative for choking and stridor.   Gastrointestinal: Negative for vomiting and blood in stool.  Genitourinary: Negative for hematuria and decreased urine volume.  Musculoskeletal: Negative for gait problem.  Skin: Negative for color change and wound.  Neurological: Negative  for tremors and numbness.     Objective:   Physical Exam BP 112/72  Pulse 47  Temp 98.5 F (36.9 C) (Oral)  Ht 6' 1"  (1.854 m)  Wt 371 lb (168.284 kg)  BMI 48.95 kg/m2  SpO2 97% Physical Exam  VS noted Constitutional: Pt appears well-developed and well-nourished.  HENT: Head: Normocephalic.  Right Ear: External ear normal.  Left Ear: External ear normal.  Eyes: Conjunctivae and EOM are normal. Pupils are equal, round, and  reactive to light.  Neck: Normal range of motion. Neck supple.  Cardiovascular: Normal rate and regular rhythm.   Pulmonary/Chest: Effort normal and breath sounds normal.  Abd:  Soft, NT, non-distended, + BS Neurological: Pt is alert. Not confused, gait/motor intact Skin: Skin is warm. No erythema.  Psychiatric: Pt behavior is normal. Thought content normal. +depressed affect, 1-2+ nervous    Assessment & Plan:

## 2011-11-12 NOTE — Assessment & Plan Note (Signed)
Change of mirtazipien from 30 to 15 mg to help with insomnia given greater antihist effect at lower strength,  to f/u any worsening symptoms or concerns

## 2011-11-12 NOTE — Assessment & Plan Note (Signed)
stable overall by hx and exam, most recent data reviewed with pt, and pt to continue medical treatment as before BP Readings from Last 3 Encounters:  11/07/11 112/72  10/31/11 156/76  10/24/11 112/62

## 2011-11-12 NOTE — Assessment & Plan Note (Signed)
For mirtazipine re-start at 15 mg, delcines counseling,  to f/u any worsening symptoms or concerns

## 2011-11-13 ENCOUNTER — Institutional Professional Consult (permissible substitution): Payer: Medicare HMO | Admitting: Pulmonary Disease

## 2011-11-17 ENCOUNTER — Other Ambulatory Visit: Payer: Self-pay | Admitting: Cardiology

## 2011-11-17 MED ORDER — PRASUGREL HCL 10 MG PO TABS: 10.0000 mg | ORAL_TABLET | Freq: Every day | ORAL | Status: DC

## 2011-11-17 MED ORDER — ISOSORBIDE MONONITRATE ER 60 MG PO TB24: ORAL_TABLET | ORAL | Status: DC

## 2011-11-21 ENCOUNTER — Other Ambulatory Visit: Payer: Self-pay | Admitting: *Deleted

## 2011-11-21 MED ORDER — ISOSORBIDE MONONITRATE ER 60 MG PO TB24: ORAL_TABLET | ORAL | Status: DC

## 2011-11-21 MED ORDER — PRASUGREL HCL 10 MG PO TABS: 10.0000 mg | ORAL_TABLET | Freq: Every day | ORAL | Status: DC

## 2011-11-24 ENCOUNTER — Other Ambulatory Visit: Payer: Self-pay | Admitting: Internal Medicine

## 2011-11-27 NOTE — Telephone Encounter (Signed)
Faxed hardcopy to pharmacy. 

## 2011-11-27 NOTE — Telephone Encounter (Signed)
Done hardcopy to [facility]  

## 2011-12-11 ENCOUNTER — Ambulatory Visit: Payer: Medicare Other | Admitting: Internal Medicine

## 2011-12-13 ENCOUNTER — Telehealth: Payer: Self-pay

## 2011-12-13 MED ORDER — CYCLOBENZAPRINE HCL 5 MG PO TABS: 5.0000 mg | ORAL_TABLET | Freq: Three times a day (TID) | ORAL | Status: AC | PRN

## 2011-12-13 NOTE — Telephone Encounter (Signed)
Ok to change back to flexeril - done erx

## 2011-12-13 NOTE — Telephone Encounter (Signed)
Patient called to inform Robaxin is not working and would like to go back to previous medication.  He stated insurance will not pay, but that he would take care of paying the price for the medication.

## 2011-12-13 NOTE — Telephone Encounter (Signed)
Called the patient informed prescription sent in to Pharmacy.

## 2011-12-18 ENCOUNTER — Ambulatory Visit: Payer: Medicare HMO | Admitting: Internal Medicine

## 2011-12-21 ENCOUNTER — Institutional Professional Consult (permissible substitution): Payer: Medicare HMO | Admitting: Pulmonary Disease

## 2011-12-23 ENCOUNTER — Other Ambulatory Visit: Payer: Self-pay | Admitting: Internal Medicine

## 2011-12-25 ENCOUNTER — Ambulatory Visit (INDEPENDENT_AMBULATORY_CARE_PROVIDER_SITE_OTHER): Payer: Medicare HMO | Admitting: Internal Medicine

## 2011-12-25 ENCOUNTER — Encounter: Payer: Self-pay | Admitting: Internal Medicine

## 2011-12-25 VITALS — BP 120/80 | HR 55 | Temp 98.6°F | Ht 72.0 in | Wt 348.0 lb

## 2011-12-25 DIAGNOSIS — F419 Anxiety disorder, unspecified: Secondary | ICD-10-CM

## 2011-12-25 DIAGNOSIS — R2689 Other abnormalities of gait and mobility: Secondary | ICD-10-CM

## 2011-12-25 DIAGNOSIS — R29818 Other symptoms and signs involving the nervous system: Secondary | ICD-10-CM

## 2011-12-25 DIAGNOSIS — R7309 Other abnormal glucose: Secondary | ICD-10-CM

## 2011-12-25 DIAGNOSIS — F411 Generalized anxiety disorder: Secondary | ICD-10-CM

## 2011-12-25 DIAGNOSIS — R7302 Impaired glucose tolerance (oral): Secondary | ICD-10-CM

## 2011-12-25 DIAGNOSIS — I10 Essential (primary) hypertension: Secondary | ICD-10-CM

## 2011-12-25 NOTE — Assessment & Plan Note (Signed)
stable overall by hx and exam, most recent data reviewed with pt, and pt to continue medical treatment as before BP Readings from Last 3 Encounters:  12/25/11 120/80  11/07/11 112/72  10/31/11 156/76

## 2011-12-25 NOTE — Assessment & Plan Note (Addendum)
stable overall by hx and exam, most recent data reviewed with pt, and pt to continue medical treatment as before Lab Results  Component Value Date   HGBA1C 6.6* 05/24/2011   Doubt related to current symptoms, to cont tx and efforts at wt loss

## 2011-12-25 NOTE — Assessment & Plan Note (Signed)
stable overall by hx and exam, most recent data reviewed with pt, and pt to continue medical treatment as before Lab Results  Component Value Date   WBC 5.0 10/20/2011   HGB 12.5* 10/20/2011   HCT 37.5* 10/20/2011   PLT 220.0 10/20/2011   GLUCOSE 165* 10/20/2011   CHOL 85 10/20/2011   TRIG 44.0 10/20/2011   HDL 36.70* 10/20/2011   LDLCALC 40 10/20/2011   ALT 19 10/20/2011   AST 18 10/20/2011   NA 140 10/20/2011   K 4.1 10/20/2011   CL 109 10/20/2011   CREATININE 1.0 10/20/2011   BUN 19 10/20/2011   CO2 25 10/20/2011   TSH 1.39 05/24/2011   PSA 0.48 05/24/2011   INR 1.1* 10/20/2011   HGBA1C 6.6* 05/24/2011

## 2011-12-25 NOTE — Assessment & Plan Note (Addendum)
Somewhat suspicious today for cerebellar problem, but cant r/o orthopedic or neurologic related to lumbar disease;  I offered MRI head and neurology referral but he defers due to cost today; states will cont to monitor for any worsening, to avoid high places

## 2011-12-25 NOTE — Patient Instructions (Addendum)
Continue all other medications as before Please return for any worsening symptoms to consider MRI head and neurology Please have the pharmacy call with any refills you may need. Please return in 3 mo with Lab testing done 3-5 days before

## 2011-12-25 NOTE — Progress Notes (Signed)
Subjective:    Patient ID: Travis [name], male    DOB: [birth year], 61 y.o.   MRN: [id number]  HPI  Here to f/u;  Has sense of stumbling worse with standing and trying to change directions;  Has had sense of being off balance, and fell out of the boat in [place] fishing twice.  Has chronic hearing loss, tinnitus no change.  Doesn't happen every day, only sometimes, no dizziness per se - denies lightheaded or room spinning.  No prior hx. Has fallen now three time in the past 2 months total  - the last at foot ball camp where he "just stumbled down", but often has to "hop" or grab on to avoid falling.  No HA, fever, neck or back pain worsening, no bowel or bladder change, fever, wt loss,  worsening LE pain/numbness/weakness (has chronic left leg numbness), gait change except does remember twice when had to catch himself with walking when "took a wrong step.".  Has right ankle brace he wears for support wearing or not he's not sure related to the falls.  No recent med changes, head trauma.  Under quite a bit of stress, niece ran away from home last night.  Not wearing the AFO at this time for the chronic right ankle pain as it is being repaired.  Pt denies polydipsia, polyuria.  Pt states overall good compliance with meds, trying to follow lower cholesterol, diabetic diet, wt overall down intentionally   Past Medical History  Diagnosis Date  . Impaired glucose tolerance 09/27/2010  . HYPERLIPIDEMIA-MIXED 07/15/2009  . DEPRESSION 06/24/2009  . Chronic pain syndrome 10/27/2009  . HYPERTENSION 06/24/2009  . CORONARY ARTERY DISEASE 06/24/2009    s/p multiple PCIs  . URETHRAL STRICTURE 06/24/2009  . DEGENERATIVE JOINT DISEASE 06/24/2009  . SHOULDER PAIN, BILATERAL 06/24/2009  . ANKLE PAIN, RIGHT 06/24/2009  . [place] DISEASE, LUMBAR 04/19/2010  . SCIATICA, LEFT 04/19/2010  . FATIGUE 06/24/2009  . NEPHROLITHIASIS, HX OF 06/24/2009  . ERECTILE DYSFUNCTION, ORGANIC 05/30/2010  . OSA (obstructive sleep apnea)     not using CPAP  very often  . Obesity   . Allergy to clopidogrel   . ALLERGIC RHINITIS 06/24/2009  . Anxiety 11/12/2011   Past Surgical History  Procedure Date  . Tonsillectomy   . Rotator cuff repair     bilateral  . S/p knee replacement 2008  . S/p bilat wrist surgury   . S/p urethral dilations, multi 2009,2010  . Carpal tunnel release   . S/p right knee arthroscopy 03/2005    and multi prior  . S/p umbilical hernia   . S/p left knee arthroscopy june 2002    multi prior  . S/p right shoulder rotater cuff surgury april 2011    Dr. [name] Lighter    reports that he quit smoking about 15 months ago. His smoking use included Cigars. He has never used smokeless tobacco. He reports that he drinks alcohol. He reports that he does not use illicit drugs. family history includes Cancer in his father and mother; Coronary artery disease in his other; Depression in his brother, mother, and other; Diabetes in his father; Early death in his paternal grandfather; Early death (age of onset:65) in his maternal grandfather; Heart disease in his father, maternal grandfather, and mother; Hyperlipidemia in his father; and Hypertension in his brother, father, mother, and other. No Known Allergies Current Outpatient Prescriptions on File Prior to Visit  Medication Sig Dispense Refill  . aspirin 81 MG tablet Take 81 mg by mouth  daily.        . atorvastatin (LIPITOR) 20 MG tablet TAKE ONE TABLET BY MOUTH ONE TIME DAILY  90 tablet  3  . benazepril (LOTENSIN) 20 MG tablet Take 1 tablet (20 mg total) by mouth daily.  90 tablet  2  . buPROPion (WELLBUTRIN XL) 300 MG 24 hr tablet Take 1 tablet (300 mg total) by mouth daily.  30 tablet  6  . clonazePAM (KLONOPIN) 1 MG tablet 1/2 to 1 tab by mouth twice per day as needed  60 tablet  2  . diclofenac sodium (VOLTAREN) 1 % GEL Apply 1 application topically 3 (three) times daily.        [name] FLUoxetine (PROZAC) 20 MG capsule TAKE ONE CAPSULE BY MOUTH ONE TIME DAILY  30 capsule  6  . fluticasone  (FLONASE) 50 MCG/ACT nasal spray USE 2 SPRAYS INTO EACH NOSTRIL ONCE DAILY  16 g  11  . gabapentin (NEURONTIN) 800 MG tablet Take 1 tablet (800 mg total) by mouth 3 (three) times daily.  90 tablet  5  . indomethacin (INDOCIN) 25 MG capsule TAKE ONE CAPSULE BY MOUTH FOUR TIMES DAILY  120 capsule  6  . isosorbide mononitrate (IMDUR) 60 MG 24 hr tablet Take 1 1/2 tablet by mouth daily  45 tablet  1  . LIDODERM 5 % APPLY 1 PATCH THREE TIMES DAILY AS NEEDED FOR PAIN  90 each  5  . mirtazapine (REMERON) 30 MG tablet Take 0.5 tablets (15 mg total) by mouth at bedtime.  90 tablet  3  . nitroGLYCERIN (NITROSTAT) 0.4 MG SL tablet Place 1 tablet (0.4 mg total) under the tongue every 5 (five) minutes as needed for chest pain.  25 tablet  3  . oxyCODONE (ROXICODONE) 15 MG immediate release tablet Take 1 tablet (15 mg total) by mouth every 6 (six) hours as needed for pain. 1 by mouth five times a day as needed  150 tablet  0  . prasugrel (EFFIENT) 10 MG TABS Take 1 tablet (10 mg total) by mouth daily.  30 tablet  1  . traMADol (ULTRAM) 50 MG tablet Take 1 tablet (50 mg total) by mouth 2 (two) times daily as needed for pain.  60 tablet  1  . vardenafil (LEVITRA) 20 MG tablet Take 20 mg by mouth every other day. Reports he is rarely using. He is aware that he is on Imdur      . zolpidem (AMBIEN) 10 MG tablet TAKE 2 TABLETS BY MOUTH EVERY NIGHT AT BEDTIME AS NEEDED  180 tablet  1  . amLODipine (NORVASC) 5 MG tablet TAKE ONE TABLET BY MOUTH ONE TIME DAILY  90 tablet  3  . atenolol (TENORMIN) 25 MG tablet TAKE ONE TABLET BY MOUTH ONE TIME DAILY  90 tablet  3  . benzonatate (TESSALON PERLES) 100 MG capsule 1-2 tabs by mouth three times per day as needed for cough  90 capsule  1  . traZODone (DESYREL) 50 MG tablet Take 0.5-1 tablets (25-50 mg total) by mouth at bedtime as needed for sleep.  90 tablet  1   Review of Systems Review of Systems  Constitutional: Negative for diaphoresis and unexpected weight change.    HENT: Negative for drooling and tinnitus.   Eyes: Negative for photophobia and visual disturbance.  Respiratory: Negative for choking and stridor.   Gastrointestinal: Negative for vomiting and blood in stool.  Genitourinary: Negative for hematuria and decreased urine volume.  Musculoskeletal: Negative for acute joint swellng.  Skin: Negative for color change and wound.  Neurological: Negative for tremors.  Psychiatric/Behavioral: Negative for decreased concentration. The patient is not hyperactive.      Objective:   Physical Exam BP 120/80  Pulse 55  Temp 98.6 F (37 C) (Oral)  Ht 6' (1.829 m)  Wt 348 lb (157.852 kg)  BMI 47.20 kg/m2  SpO2 95% Physical Exam  VS noted Constitutional: Pt appears well-developed and well-nourished.  HENT: Head: Normocephalic.  Right Ear: External ear normal.  Left Ear: External ear normal.  Eyes: Conjunctivae and EOM are normal. Pupils are equal, round, and reactive to light.  Neck: Normal range of motion. Neck supple.  Cardiovascular: Normal rate and regular rhythm.   Pulmonary/Chest: Effort normal and breath sounds normal.  Abd:  Soft, NT, non-distended, + BS Neurological: Pt is alert. No cranial nerve deficit. FTN and heel to shin intact, motor intact, does a "hop" step on turning while standing to leave Skin: Skin is warm. No erythema.  Psychiatric: Pt behavior is normal. Thought content normal. 1+ nervous    Assessment & Plan:

## 2011-12-27 ENCOUNTER — Other Ambulatory Visit: Payer: Self-pay | Admitting: Internal Medicine

## 2011-12-27 ENCOUNTER — Telehealth: Payer: Self-pay | Admitting: Physical Medicine & Rehabilitation

## 2011-12-27 NOTE — Telephone Encounter (Signed)
Pharmacy claims to have faxed over refill request, but we have not returned.  [facility] on [address]

## 2011-12-27 NOTE — Telephone Encounter (Signed)
Pt aware that these have been taken care of.

## 2011-12-29 ENCOUNTER — Telehealth: Payer: Self-pay | Admitting: Internal Medicine

## 2011-12-29 DIAGNOSIS — F32A Depression, unspecified: Secondary | ICD-10-CM

## 2011-12-29 DIAGNOSIS — F329 Major depressive disorder, single episode, unspecified: Secondary | ICD-10-CM

## 2011-12-29 MED ORDER — BUPROPION HCL ER (XL) 300 MG PO TB24: 300.0000 mg | ORAL_TABLET | Freq: Every day | ORAL | Status: DC

## 2011-12-29 NOTE — Telephone Encounter (Signed)
Both done

## 2011-12-29 NOTE — Telephone Encounter (Signed)
Very sorry, but I would not feel comfortable with this as he is on 4 other medications already that are "psychoactive" - including the wellbutrin, klonopin, remeron, prozac and even gabapentin can affect the central nervous system  Ideally, he should be followed per psychiatry for this - can I do a referral?

## 2011-12-29 NOTE — Telephone Encounter (Signed)
Called the patient informed of MD instructions on medication and denial of request.  Patient does want a referral, also requesting a refill on wellbutrin

## 2012-01-01 ENCOUNTER — Encounter: Payer: Medicare HMO | Attending: Physical Medicine and Rehabilitation | Admitting: Physical Medicine and Rehabilitation

## 2012-01-01 ENCOUNTER — Encounter: Payer: Self-pay | Admitting: Physical Medicine and Rehabilitation

## 2012-01-01 ENCOUNTER — Telehealth: Payer: Self-pay

## 2012-01-01 VITALS — BP 139/56 | HR 64 | Resp 16 | Ht 73.0 in | Wt 353.0 lb

## 2012-01-01 DIAGNOSIS — M1990 Unspecified osteoarthritis, unspecified site: Secondary | ICD-10-CM

## 2012-01-01 DIAGNOSIS — M545 Low back pain, unspecified: Secondary | ICD-10-CM | POA: Insufficient documentation

## 2012-01-01 DIAGNOSIS — M25571 Pain in right ankle and joints of right foot: Secondary | ICD-10-CM

## 2012-01-01 DIAGNOSIS — M25569 Pain in unspecified knee: Secondary | ICD-10-CM | POA: Insufficient documentation

## 2012-01-01 DIAGNOSIS — G894 Chronic pain syndrome: Secondary | ICD-10-CM

## 2012-01-01 DIAGNOSIS — G8929 Other chronic pain: Secondary | ICD-10-CM | POA: Insufficient documentation

## 2012-01-01 DIAGNOSIS — M25579 Pain in unspecified ankle and joints of unspecified foot: Secondary | ICD-10-CM | POA: Insufficient documentation

## 2012-01-01 DIAGNOSIS — M25519 Pain in unspecified shoulder: Secondary | ICD-10-CM | POA: Insufficient documentation

## 2012-01-01 MED ORDER — OXYCODONE HCL 15 MG PO TABS: 15.0000 mg | ORAL_TABLET | Freq: Four times a day (QID) | ORAL | Status: DC | PRN

## 2012-01-01 NOTE — Patient Instructions (Signed)
Continue with exercising in the pool regulary, continue with eating healthy and loosing weight.

## 2012-01-01 NOTE — Telephone Encounter (Signed)
A user error has taken place: encounter opened in error, closed for administrative reasons.

## 2012-01-01 NOTE — Progress Notes (Signed)
Subjective:    Patient ID: Travis [name], male    DOB: 1950-12-07, 61 y.o.   MRN: [id number]  HPI The patient reports that the medication is controlling his pain very well.  The problem has been stable. He reports that he has been working out in the pool every day. He also states that he has lost 30 pounds since his last visit.   Pain Inventory Average Pain 6 Pain Right Now 7 My pain is sharp and aching  In the last 24 hours, has pain interfered with the following? General activity 3 Relation with others 3 Enjoyment of life 4 What TIME of day is your pain at its worst? evening Sleep (in general) Poor  Pain is worse with: walking, bending, sitting, inactivity, standing and some activites Pain improves with: rest, heat/ice, therapy/exercise and medication Relief from Meds: 7  Mobility walk without assistance how many minutes can you walk? 30 ability to climb steps?  yes do you drive?  yes  Function employed # of hrs/week  I need assistance with the following:  household duties and shopping  Neuro/Psych No problems in this area  Prior Studies Any changes since last visit?  no  Physicians involved in your care Any changes since last visit?  no   Family History  Problem Relation Age of Onset  . Depression Mother   . Heart disease Mother   . Hypertension Mother   . Cancer Mother     Breast  . Diabetes Father   . Heart disease Father     CABG  . Cancer Father     prostate and skin cancer  . Hypertension Father   . Hyperlipidemia Father   . Depression Brother     x 2  . Hypertension Brother     x2  . Coronary artery disease Other   . Hypertension Other   . Depression Other   . Heart disease Maternal Grandfather   . Early death Maternal Grandfather 39    heart attack  . Early death Paternal Grandfather    History   Social History  . Marital Status: Divorced    Spouse Name: N/A    Number of Children: 2  . Years of Education: N/A   Occupational  History  . disabled since 2006 due to ortho. heart, psych   . part time work as an Multimedia programmer, wrestling, and baseball Agricultural consultant    Social History Main Topics  . Smoking status: Former Smoker    Types: Cigars    Quit date: 08/28/2010  . Smokeless tobacco: Never Used   Comment: rare  . Alcohol Use: 0.0 oz/week     occasional  . Drug Use: No  . Sexually Active: Not Currently   Other Topics Concern  . None   Social History Narrative   Played semi-pro football, used steroidsDivorced moved here from [place], [place]   Past Surgical History  Procedure Date  . Tonsillectomy   . Rotator cuff repair     bilateral  . S/p knee replacement 2008  . S/p bilat wrist surgury   . S/p urethral dilations, multi 2009,2010  . Carpal tunnel release   . S/p right knee arthroscopy 03/2005    and multi prior  . S/p umbilical hernia   . S/p left knee arthroscopy june 2002    multi prior  . S/p right shoulder rotater cuff surgury april 2011    Dr. [name] Lighter   Past Medical History  Diagnosis Date  . Impaired  glucose tolerance 09/27/2010  . HYPERLIPIDEMIA-MIXED 07/15/2009  . DEPRESSION 06/24/2009  . Chronic pain syndrome 10/27/2009  . HYPERTENSION 06/24/2009  . CORONARY ARTERY DISEASE 06/24/2009    s/p multiple PCIs  . URETHRAL STRICTURE 06/24/2009  . DEGENERATIVE JOINT DISEASE 06/24/2009  . SHOULDER PAIN, BILATERAL 06/24/2009  . ANKLE PAIN, RIGHT 06/24/2009  . [place] DISEASE, LUMBAR 04/19/2010  . SCIATICA, LEFT 04/19/2010  . FATIGUE 06/24/2009  . NEPHROLITHIASIS, HX OF 06/24/2009  . ERECTILE DYSFUNCTION, ORGANIC 05/30/2010  . OSA (obstructive sleep apnea)     not using CPAP very often  . Obesity   . Allergy to clopidogrel   . ALLERGIC RHINITIS 06/24/2009  . Anxiety 11/12/2011   BP 139/56  Pulse 64  Resp 16  Ht 6' 1"  (1.854 m)  Wt 353 lb (160.12 kg)  BMI 46.57 kg/m2  SpO2 95%      Review of Systems  Constitutional: Negative.   HENT: Positive for neck pain.   Eyes: Negative.     Respiratory: Negative.   Cardiovascular: Negative.   Gastrointestinal: Negative.   Genitourinary: Negative.   Musculoskeletal: Positive for back pain.  Skin: Negative.   Neurological: Negative.   Hematological: Negative.   Psychiatric/Behavioral: Negative.        Objective:   Physical Exam Constitutional: He is oriented to person, place, and time. He appears well-developed.  Morbidly obese  HENT:  Head: Normocephalic.  Neck: Neck supple.  Musculoskeletal: He exhibits tenderness.  Right shoulder: He exhibits decreased range of motion, tenderness and pain.  Right knee: tenderness found.  Left knee: tenderness found.  Lumbar back: He exhibits decreased range of motion and tenderness.  Neurological: He is alert and oriented to person, place, and time.  Skin: Skin is warm and dry.  Psychiatric: He has a normal mood and affect.  Symmetric normal motor tone is noted throughout. Normal muscle bulk. Muscle testing reveals 5/5 muscle strength of the upper extremity, and 5/5 of the lower extremity. DTR in the upper and lower extremity are present and symmetric 2+. No clonus is noted.  Patient arises from chair with mild difficulty. Wide based gait with normal arm swing bilateral.        Assessment & Plan:  This is a 61 year old male with  1.Chronic LBP  2.Right shoulder pain  3.Bilateral knee pain 4. Right ankle pain  Plan :  Continue with medication, continue with exercising in the pool, also continue to eat healthy and loose weight. Follow up in 2 months.

## 2012-01-14 ENCOUNTER — Other Ambulatory Visit: Payer: Self-pay | Admitting: Internal Medicine

## 2012-01-15 NOTE — Telephone Encounter (Signed)
Done erx 

## 2012-01-26 ENCOUNTER — Telehealth: Payer: Self-pay | Admitting: *Deleted

## 2012-01-26 MED ORDER — OXYCODONE HCL 15 MG PO TABS: 15.0000 mg | ORAL_TABLET | Freq: Every day | ORAL | Status: DC | PRN

## 2012-01-26 NOTE — Telephone Encounter (Signed)
Pt aware that rx is ready for him to pick up.

## 2012-01-26 NOTE — Telephone Encounter (Signed)
Refill Oxycodone

## 2012-01-26 NOTE — Telephone Encounter (Signed)
Printed for Santiago Glad to sign

## 2012-01-29 ENCOUNTER — Other Ambulatory Visit: Payer: Self-pay

## 2012-01-29 DIAGNOSIS — G4700 Insomnia, unspecified: Secondary | ICD-10-CM

## 2012-01-29 MED ORDER — TRAZODONE HCL 50 MG PO TABS: 25.0000 mg | ORAL_TABLET | Freq: Every evening | ORAL | Status: DC | PRN

## 2012-01-29 NOTE — Telephone Encounter (Signed)
Done per emr 

## 2012-02-23 ENCOUNTER — Other Ambulatory Visit: Payer: Self-pay | Admitting: Physical Medicine & Rehabilitation

## 2012-02-23 ENCOUNTER — Other Ambulatory Visit: Payer: Self-pay | Admitting: Internal Medicine

## 2012-02-23 MED ORDER — CLONAZEPAM 1 MG PO TABS: ORAL_TABLET | ORAL | Status: DC

## 2012-02-23 MED ORDER — TRAZODONE HCL 50 MG PO TABS: ORAL_TABLET | ORAL | Status: DC

## 2012-02-23 NOTE — Telephone Encounter (Signed)
Faxed hardcopy to pharmacy. 

## 2012-02-23 NOTE — Telephone Encounter (Addendum)
Done hardcopy to robin  

## 2012-02-26 ENCOUNTER — Ambulatory Visit: Payer: Medicare HMO | Admitting: Physical Medicine and Rehabilitation

## 2012-02-27 ENCOUNTER — Encounter: Payer: Self-pay | Admitting: Physical Medicine and Rehabilitation

## 2012-02-27 ENCOUNTER — Encounter: Payer: Medicare HMO | Attending: Physical Medicine and Rehabilitation | Admitting: Physical Medicine and Rehabilitation

## 2012-02-27 VITALS — BP 112/60 | HR 62 | Resp 16 | Ht 73.0 in | Wt 344.0 lb

## 2012-02-27 DIAGNOSIS — M51379 Other intervertebral disc degeneration, lumbosacral region without mention of lumbar back pain or lower extremity pain: Secondary | ICD-10-CM

## 2012-02-27 DIAGNOSIS — M25571 Pain in right ankle and joints of right foot: Secondary | ICD-10-CM

## 2012-02-27 DIAGNOSIS — M545 Low back pain, unspecified: Secondary | ICD-10-CM

## 2012-02-27 DIAGNOSIS — M25519 Pain in unspecified shoulder: Secondary | ICD-10-CM | POA: Insufficient documentation

## 2012-02-27 DIAGNOSIS — M51369 Other intervertebral disc degeneration, lumbar region without mention of lumbar back pain or lower extremity pain: Secondary | ICD-10-CM

## 2012-02-27 DIAGNOSIS — M25579 Pain in unspecified ankle and joints of unspecified foot: Secondary | ICD-10-CM

## 2012-02-27 DIAGNOSIS — G8929 Other chronic pain: Secondary | ICD-10-CM | POA: Insufficient documentation

## 2012-02-27 DIAGNOSIS — M5137 Other intervertebral disc degeneration, lumbosacral region: Secondary | ICD-10-CM

## 2012-02-27 DIAGNOSIS — M5136 Other intervertebral disc degeneration, lumbar region: Secondary | ICD-10-CM

## 2012-02-27 DIAGNOSIS — M25569 Pain in unspecified knee: Secondary | ICD-10-CM | POA: Insufficient documentation

## 2012-02-27 MED ORDER — TRAMADOL HCL 50 MG PO TABS: 50.0000 mg | ORAL_TABLET | Freq: Two times a day (BID) | ORAL | Status: DC | PRN

## 2012-02-27 MED ORDER — OXYCODONE HCL 15 MG PO TABS: 15.0000 mg | ORAL_TABLET | Freq: Every day | ORAL | Status: DC | PRN

## 2012-02-27 NOTE — Addendum Note (Signed)
Addended by: Horatio Pel on: 02/27/2012 01:15 PM   Modules accepted: Orders

## 2012-02-27 NOTE — Patient Instructions (Signed)
Continue with exercising in the gym, stay active, continue with your weight loosing program.

## 2012-02-27 NOTE — Progress Notes (Signed)
Subjective:    Patient ID: Travis [name], male    DOB: [birth year], 61 y.o.   MRN: [id number]  HPI The patient reports that the medication is controlling his pain very well.  The problem has been stable. He reports that he has been working out in the pool every day, he also works out with a Clinical research associate at the [employer], and eats a healthy diet. He also states that he has lost another 10 pounds since his last visit.   Pain Inventory Average Pain 5 Pain Right Now 4 My pain is constant  In the last 24 hours, has pain interfered with the following? General activity 5 Relation with others 5 Enjoyment of life 5 What TIME of day is your pain at its worst? daytime Sleep (in general) Fair  Pain is worse with: some activites Pain improves with: medication Relief from Meds: 6  Mobility walk without assistance  Function employed # of hrs/week   Neuro/Psych No problems in this area  Prior Studies Any changes since last visit?  no  Physicians involved in your care Any changes since last visit?  no   Family History  Problem Relation Age of Onset  . Depression Mother   . Heart disease Mother   . Hypertension Mother   . Cancer Mother     Breast  . Diabetes Father   . Heart disease Father     CABG  . Cancer Father     prostate and skin cancer  . Hypertension Father   . Hyperlipidemia Father   . Depression Brother     x 2  . Hypertension Brother     x2  . Coronary artery disease Other   . Hypertension Other   . Depression Other   . Heart disease Maternal Grandfather   . Early death Maternal Grandfather 45    heart attack  . Early death Paternal Grandfather    History   Social History  . Marital Status: Divorced    Spouse Name: N/A    Number of Children: 2  . Years of Education: N/A   Occupational History  . disabled since 2006 due to ortho. heart, psych   . part time work as an Multimedia programmer, wrestling, and baseball Agricultural consultant    Social History  Main Topics  . Smoking status: Former Smoker    Types: Cigars    Quit date: 08/28/2010  . Smokeless tobacco: Never Used   Comment: rare  . Alcohol Use: 0.0 oz/week     occasional  . Drug Use: No  . Sexually Active: Not Currently   Other Topics Concern  . None   Social History Narrative   Played semi-pro football, used steroidsDivorced moved here from [place], [place]   Past Surgical History  Procedure Date  . Tonsillectomy   . Rotator cuff repair     bilateral  . S/p knee replacement 2008  . S/p bilat wrist surgury   . S/p urethral dilations, multi 2009,2010  . Carpal tunnel release   . S/p right knee arthroscopy 03/2005    and multi prior  . S/p umbilical hernia   . S/p left knee arthroscopy june 2002    multi prior  . S/p right shoulder rotater cuff surgury april 2011    Dr. [name] Lighter   Past Medical History  Diagnosis Date  . Impaired glucose tolerance 09/27/2010  . HYPERLIPIDEMIA-MIXED 07/15/2009  . DEPRESSION 06/24/2009  . Chronic pain syndrome 10/27/2009  . HYPERTENSION 06/24/2009  . CORONARY ARTERY  DISEASE 06/24/2009    s/p multiple PCIs  . URETHRAL STRICTURE 06/24/2009  . DEGENERATIVE JOINT DISEASE 06/24/2009  . SHOULDER PAIN, BILATERAL 06/24/2009  . ANKLE PAIN, RIGHT 06/24/2009  . [place] DISEASE, LUMBAR 04/19/2010  . SCIATICA, LEFT 04/19/2010  . FATIGUE 06/24/2009  . NEPHROLITHIASIS, HX OF 06/24/2009  . ERECTILE DYSFUNCTION, ORGANIC 05/30/2010  . OSA (obstructive sleep apnea)     not using CPAP very often  . Obesity   . Allergy to clopidogrel   . ALLERGIC RHINITIS 06/24/2009  . Anxiety 11/12/2011   BP 112/60  Pulse 62  Resp 16  Ht 6' 1"  (1.854 m)  Wt 344 lb (156.037 kg)  BMI 45.39 kg/m2  SpO2 98%      Review of Systems  Constitutional: Negative.   HENT: Positive for neck stiffness.   Eyes: Negative.   Respiratory: Negative.   Cardiovascular: Negative.   Gastrointestinal: Negative.   Genitourinary: Negative.   Musculoskeletal: Positive for myalgias, back pain and  arthralgias.  Skin: Negative.   Neurological: Negative.   Hematological: Negative.   Psychiatric/Behavioral: Negative.        Objective:   Physical Exam Constitutional: He is oriented to person, place, and time. He appears well-developed.  Morbidly obese  HENT:  Head: Normocephalic.  Neck: Neck supple.  Musculoskeletal: He exhibits tenderness.  Right shoulder: He exhibits decreased range of motion, tenderness and pain.  Right knee: tenderness found.  Left knee: tenderness found.  Lumbar back: He exhibits decreased range of motion and tenderness.  Neurological: He is alert and oriented to person, place, and time.  Skin: Skin is warm and dry.  Psychiatric: He has a normal mood and affect.  Symmetric normal motor tone is noted throughout. Normal muscle bulk. Muscle testing reveals 5/5 muscle strength of the upper extremity, and 5/5 of the lower extremity.  DTR in the upper and lower extremity are present and symmetric 2+. No clonus is noted.  Patient arises from chair with mild difficulty. Wide based gait with normal arm swing bilateral.         Assessment & Plan:  This is a 61 year old male with  1.Chronic LBP  2.Right shoulder pain  3.Bilateral knee pain  4. Right ankle pain  Plan :  Continue with medication, continue with exercising in the pool and with a trainer at the [facility], also continue to eat healthy and loose weight.  Follow up in 2 months.

## 2012-03-18 ENCOUNTER — Other Ambulatory Visit: Payer: Self-pay

## 2012-03-18 MED ORDER — CYCLOBENZAPRINE HCL 5 MG PO TABS: 5.0000 mg | ORAL_TABLET | Freq: Three times a day (TID) | ORAL | Status: DC | PRN

## 2012-03-18 NOTE — Telephone Encounter (Signed)
[place] for flexeril, but I had to d/c the robaxin, should not take both

## 2012-03-18 NOTE — Telephone Encounter (Signed)
Please advise if ok to fill Cyclobenzaprine 5 mg as not on current med. list

## 2012-03-19 NOTE — Telephone Encounter (Signed)
Called the patient informed of MD instructions.

## 2012-03-28 ENCOUNTER — Telehealth: Payer: Self-pay | Admitting: Physical Medicine & Rehabilitation

## 2012-03-28 MED ORDER — OXYCODONE HCL 15 MG PO TABS: 15.0000 mg | ORAL_TABLET | Freq: Every day | ORAL | Status: DC | PRN

## 2012-03-28 NOTE — Telephone Encounter (Signed)
RX printed for Travis [name] to sign.

## 2012-03-28 NOTE — Telephone Encounter (Signed)
Refill on Oxycodone.  Out on Sunday.

## 2012-03-29 NOTE — Telephone Encounter (Signed)
Notified by personally identified email that RX available.

## 2012-03-30 ENCOUNTER — Other Ambulatory Visit: Payer: Self-pay | Admitting: Cardiology

## 2012-04-15 ENCOUNTER — Other Ambulatory Visit: Payer: Self-pay | Admitting: Cardiology

## 2012-04-15 NOTE — Telephone Encounter (Signed)
Pt needs  rx called in asap going out of town, pls  call [phone]

## 2012-04-16 MED ORDER — VARDENAFIL HCL 20 MG PO TABS: 20.0000 mg | ORAL_TABLET | ORAL | Status: DC

## 2012-04-26 ENCOUNTER — Ambulatory Visit: Payer: Medicare HMO | Admitting: Physical Medicine and Rehabilitation

## 2012-04-29 ENCOUNTER — Encounter: Payer: Self-pay | Admitting: Physical Medicine and Rehabilitation

## 2012-04-29 ENCOUNTER — Encounter: Payer: Medicare HMO | Attending: Physical Medicine and Rehabilitation | Admitting: Physical Medicine and Rehabilitation

## 2012-04-29 VITALS — BP 150/47 | HR 68 | Resp 14 | Ht 73.0 in | Wt 333.8 lb

## 2012-04-29 DIAGNOSIS — G8929 Other chronic pain: Secondary | ICD-10-CM | POA: Insufficient documentation

## 2012-04-29 DIAGNOSIS — M5136 Other intervertebral disc degeneration, lumbar region: Secondary | ICD-10-CM

## 2012-04-29 DIAGNOSIS — M5137 Other intervertebral disc degeneration, lumbosacral region: Secondary | ICD-10-CM

## 2012-04-29 DIAGNOSIS — M25519 Pain in unspecified shoulder: Secondary | ICD-10-CM | POA: Insufficient documentation

## 2012-04-29 DIAGNOSIS — M545 Low back pain, unspecified: Secondary | ICD-10-CM | POA: Insufficient documentation

## 2012-04-29 DIAGNOSIS — M25579 Pain in unspecified ankle and joints of unspecified foot: Secondary | ICD-10-CM | POA: Insufficient documentation

## 2012-04-29 DIAGNOSIS — M25569 Pain in unspecified knee: Secondary | ICD-10-CM | POA: Insufficient documentation

## 2012-04-29 MED ORDER — OXYCODONE HCL 15 MG PO TABS: 15.0000 mg | ORAL_TABLET | Freq: Every day | ORAL | Status: DC | PRN

## 2012-04-29 MED ORDER — TRAMADOL HCL 50 MG PO TABS: 50.0000 mg | ORAL_TABLET | Freq: Two times a day (BID) | ORAL | Status: DC | PRN

## 2012-04-29 NOTE — Progress Notes (Signed)
Subjective:    Patient ID: Travis [name], male    DOB: [birth year], 61 y.o.   MRN: [id number]  HPI The patient reports that the medication is controlling his pain very well.  The problem has been stable. He reports that he has been working out in the pool every day, he also works out with a Clinical research associate at the [employer], and eats a healthy diet. He also states that he has lost another 11 pounds since his last visit. He is a little down today, because his girlfriend, of 3 1/2 years, broke up with him.  Pain Inventory Average Pain 4 Pain Right Now 4 My pain is constant, sharp and burning  In the last 24 hours, has pain interfered with the following? General activity 5 Relation with others 5 Enjoyment of life 10 What TIME of day is your pain at its worst? morning and night Sleep (in general) Poor  Pain is worse with: sitting Pain improves with: rest, heat/ice and medication Relief from Meds: 5  Mobility walk without assistance how many minutes can you walk? 15 ability to climb steps?  yes do you drive?  yes  Function employed # of hrs/week 10 disabled: date disabled 2007  Neuro/Psych tingling trouble walking depression suicidal thoughts no plan  Prior Studies Any changes since last visit?  no  Physicians involved in your care Any changes since last visit?  no   Family History  Problem Relation Age of Onset  . Depression Mother   . Heart disease Mother   . Hypertension Mother   . Cancer Mother     Breast  . Diabetes Father   . Heart disease Father     CABG  . Cancer Father     prostate and skin cancer  . Hypertension Father   . Hyperlipidemia Father   . Depression Brother     x 2  . Hypertension Brother     x2  . Coronary artery disease Other   . Hypertension Other   . Depression Other   . Heart disease Maternal Grandfather   . Early death Maternal Grandfather 37    heart attack  . Early death Paternal Grandfather    History   Social History  . Marital  Status: Divorced    Spouse Name: N/A    Number of Children: 2  . Years of Education: N/A   Occupational History  . disabled since 2006 due to ortho. heart, psych   . part time work as an Multimedia programmer, wrestling, and baseball Agricultural consultant    Social History Main Topics  . Smoking status: Former Smoker    Types: Cigars    Quit date: 08/28/2010  . Smokeless tobacco: Never Used     Comment: rare  . Alcohol Use: 0.0 oz/week     Comment: occasional  . Drug Use: No  . Sexually Active: Not Currently   Other Topics Concern  . None   Social History Narrative   Played semi-pro football, used steroidsDivorced moved here from [place], [place]   Past Surgical History  Procedure Date  . Tonsillectomy   . Rotator cuff repair     bilateral  . S/p knee replacement 2008  . S/p bilat wrist surgury   . S/p urethral dilations, multi 2009,2010  . Carpal tunnel release   . S/p right knee arthroscopy 03/2005    and multi prior  . S/p umbilical hernia   . S/p left knee arthroscopy june 2002    multi prior  .  S/p right shoulder rotater cuff surgury april 2011    Dr. [name] Lighter   Past Medical History  Diagnosis Date  . Impaired glucose tolerance 09/27/2010  . HYPERLIPIDEMIA-MIXED 07/15/2009  . DEPRESSION 06/24/2009  . Chronic pain syndrome 10/27/2009  . HYPERTENSION 06/24/2009  . CORONARY ARTERY DISEASE 06/24/2009    s/p multiple PCIs  . URETHRAL STRICTURE 06/24/2009  . DEGENERATIVE JOINT DISEASE 06/24/2009  . SHOULDER PAIN, BILATERAL 06/24/2009  . ANKLE PAIN, RIGHT 06/24/2009  . [place] DISEASE, LUMBAR 04/19/2010  . SCIATICA, LEFT 04/19/2010  . FATIGUE 06/24/2009  . NEPHROLITHIASIS, HX OF 06/24/2009  . ERECTILE DYSFUNCTION, ORGANIC 05/30/2010  . OSA (obstructive sleep apnea)     not using CPAP very often  . Obesity   . Allergy to clopidogrel   . ALLERGIC RHINITIS 06/24/2009  . Anxiety 11/12/2011   BP 150/47  Pulse 68  Resp 14  Ht 6' 1"  (1.854 m)  Wt 333 lb 12.8 oz (151.411 kg)  BMI 44.04  kg/m2  SpO2 96%    Review of Systems  HENT: Positive for neck pain.   Musculoskeletal: Positive for back pain and gait problem.  Neurological:       Tingling  Psychiatric/Behavioral: Positive for suicidal ideas and dysphoric mood.  All other systems reviewed and are negative.       Objective:   Physical Exam Constitutional: He is oriented to person, place, and time. He appears well-developed.  Morbidly obese  HENT:  Head: Normocephalic.  Neck: Neck supple.  Musculoskeletal: He exhibits tenderness.  Right shoulder: He exhibits decreased range of motion, tenderness and pain.  Right knee: tenderness found.  Left knee: tenderness found.  Lumbar back: He exhibits decreased range of motion and tenderness.  Neurological: He is alert and oriented to person, place, and time.  Skin: Skin is warm and dry.  Psychiatric: He has a normal mood and affect.  Symmetric normal motor tone is noted throughout. Normal muscle bulk. Muscle testing reveals 5/5 muscle strength of the upper extremity, and 5/5 of the lower extremity.  DTR in the upper and lower extremity are present and symmetric 2+. No clonus is noted.  Patient arises from chair with mild difficulty. Wide based gait with normal arm swing bilateral.         Assessment & Plan:  This is a 61 year old male with  1.Chronic LBP  2.Right shoulder pain  3.Bilateral knee pain  4. Right ankle pain  Plan :  Continue with medication, continue with exercising in the pool, also practise some balance exercises in the pool, and continue with working out with a trainer at [employer], also continue to eat healthy and loose weight.  Follow up in 2 months.

## 2012-04-29 NOTE — Patient Instructions (Signed)
Continue with your exercising in the gym, continue with your healthy diet.

## 2012-05-06 ENCOUNTER — Encounter: Payer: Self-pay | Admitting: Internal Medicine

## 2012-05-06 ENCOUNTER — Ambulatory Visit (INDEPENDENT_AMBULATORY_CARE_PROVIDER_SITE_OTHER): Payer: Medicare HMO | Admitting: Internal Medicine

## 2012-05-06 ENCOUNTER — Other Ambulatory Visit (INDEPENDENT_AMBULATORY_CARE_PROVIDER_SITE_OTHER): Payer: Medicare HMO

## 2012-05-06 VITALS — BP 120/70 | HR 49 | Temp 98.3°F | Ht 73.0 in | Wt 320.0 lb

## 2012-05-06 DIAGNOSIS — R7309 Other abnormal glucose: Secondary | ICD-10-CM

## 2012-05-06 DIAGNOSIS — E785 Hyperlipidemia, unspecified: Secondary | ICD-10-CM

## 2012-05-06 DIAGNOSIS — Z0000 Encounter for general adult medical examination without abnormal findings: Secondary | ICD-10-CM

## 2012-05-06 DIAGNOSIS — R7302 Impaired glucose tolerance (oral): Secondary | ICD-10-CM

## 2012-05-06 DIAGNOSIS — F329 Major depressive disorder, single episode, unspecified: Secondary | ICD-10-CM

## 2012-05-06 DIAGNOSIS — I10 Essential (primary) hypertension: Secondary | ICD-10-CM

## 2012-05-06 LAB — LIPID PANEL
Cholesterol: 129 mg/dL (ref 0–200)
HDL: 31 mg/dL — ABNORMAL LOW (ref >39.00)
LDL Cholesterol: 83 mg/dL (ref 0–99)
VLDL: 15.4 mg/dL (ref 0.0–40.0)

## 2012-05-06 LAB — URINALYSIS, ROUTINE W REFLEX MICROSCOPIC
Ketones, ur: NEGATIVE
Leukocytes, UA: NEGATIVE
Specific Gravity, Urine: 1.02 (ref 1.000–1.030)
Total Protein, Urine: NEGATIVE
Urine Glucose: NEGATIVE
pH: 7 (ref 5.0–8.0)

## 2012-05-06 LAB — HEMOGLOBIN A1C: Hgb A1c MFr Bld: 6.3 % (ref 4.6–6.5)

## 2012-05-06 LAB — CBC WITH DIFFERENTIAL/PLATELET
Basophils Relative: 0.3 % (ref 0.0–3.0)
Eosinophils Relative: 1 % (ref 0.0–5.0)
HCT: 39.5 % (ref 39.0–52.0)
Hemoglobin: 13.4 g/dL (ref 13.0–17.0)
Lymphs Abs: 1.2 10*3/uL (ref 0.7–4.0)
Monocytes Relative: 7.7 % (ref 3.0–12.0)
Neutro Abs: 3.2 10*3/uL (ref 1.4–7.7)
WBC: 4.9 10*3/uL (ref 4.5–10.5)

## 2012-05-06 LAB — BASIC METABOLIC PANEL
BUN: 14 mg/dL (ref 6–23)
Calcium: 9.1 mg/dL (ref 8.4–10.5)
GFR: 91.5 mL/min (ref >60.00)
Glucose, Bld: 114 mg/dL — ABNORMAL HIGH (ref 70–99)
Sodium: 139 mEq/L (ref 135–145)

## 2012-05-06 LAB — HEPATIC FUNCTION PANEL
Albumin: 4.1 g/dL (ref 3.5–5.2)
Total Bilirubin: 0.8 mg/dL (ref 0.3–1.2)

## 2012-05-06 NOTE — Assessment & Plan Note (Signed)
stable overall by hx and exam, most recent data reviewed with pt, and pt to continue medical treatment as before Lab Results  Component Value Date   HGBA1C 6.6* 05/24/2011   To cont wt loss

## 2012-05-06 NOTE — Patient Instructions (Addendum)
Continue all other medications as before Please have the pharmacy call with any other refills you may need. Please continue your efforts at being more active, low cholesterol diabetic diet, and weight control. You are otherwise up to date with prevention measures Please go to LAB in the Basement for the blood and/or urine tests to be done today You will be contacted by phone if any changes need to be made immediately.  Otherwise, you will receive a letter about your results with an explanation, but please check with MyChart first. Thank you for enrolling in [facility]. Please follow the instructions below to securely access your online medical record. MyChart allows you to send messages to your doctor, view your test results, renew your prescriptions, schedule appointments, and more. To Log into MyChart, please go to [email or web address], and your Username is: [email or web address] Please return in 6 months, or sooner if needed

## 2012-05-06 NOTE — Assessment & Plan Note (Signed)

## 2012-05-06 NOTE — Progress Notes (Signed)
Subjective:    Patient ID: Travis [name], male    DOB: 05-21-1951, 61 y.o.   MRN: [id number]  HPI  Here for wellness and f/u;  Overall doing ok;  Pt denies CP, worsening SOB, DOE, wheezing, orthopnea, PND, worsening LE edema, palpitations, dizziness or syncope.  Pt denies neurological change such as new Headache, facial or extremity weakness.  Pt denies polydipsia, polyuria, or low sugar symptoms. Pt states overall good compliance with treatment and medications, good tolerability, and trying to follow lower cholesterol diet.  Pt denies worsening depressive symptoms, suicidal ideation or panic. No fever, wt loss, night sweats, loss of appetite, or other constitutional symptoms.  Pt states good ability with ADL's, low fall risk, home safety reviewed and adequate, no significant changes in hearing or vision, and occasionally active with exercise.  Lost wt from 369 to 320 today with better diet/excercise. No new complaints Past Medical History  Diagnosis Date  . Impaired glucose tolerance 09/27/2010  . HYPERLIPIDEMIA-MIXED 07/15/2009  . DEPRESSION 06/24/2009  . Chronic pain syndrome 10/27/2009  . HYPERTENSION 06/24/2009  . CORONARY ARTERY DISEASE 06/24/2009    s/p multiple PCIs  . URETHRAL STRICTURE 06/24/2009  . DEGENERATIVE JOINT DISEASE 06/24/2009  . SHOULDER PAIN, BILATERAL 06/24/2009  . ANKLE PAIN, RIGHT 06/24/2009  . [place] DISEASE, LUMBAR 04/19/2010  . SCIATICA, LEFT 04/19/2010  . FATIGUE 06/24/2009  . NEPHROLITHIASIS, HX OF 06/24/2009  . ERECTILE DYSFUNCTION, ORGANIC 05/30/2010  . OSA (obstructive sleep apnea)     not using CPAP very often  . Obesity   . Allergy to clopidogrel   . ALLERGIC RHINITIS 06/24/2009  . Anxiety 11/12/2011   Past Surgical History  Procedure Date  . Tonsillectomy   . Rotator cuff repair     bilateral  . S/p knee replacement 2008  . S/p bilat wrist surgury   . S/p urethral dilations, multi 2009,2010  . Carpal tunnel release   . S/p right knee arthroscopy 03/2005    and multi  prior  . S/p umbilical hernia   . S/p left knee arthroscopy june 2002    multi prior  . S/p right shoulder rotater cuff surgury april 2011    Dr. [name] Lighter    reports that he quit smoking about 20 months ago. His smoking use included Cigars. He has never used smokeless tobacco. He reports that he drinks alcohol. He reports that he does not use illicit drugs. family history includes Cancer in his father and mother; Coronary artery disease in his other; Depression in his brother, mother, and other; Diabetes in his father; Early death in his paternal grandfather; Early death (age of onset:65) in his maternal grandfather; Heart disease in his father, maternal grandfather, and mother; Hyperlipidemia in his father; and Hypertension in his brother, father, mother, and other. No Known Allergies Current Outpatient Prescriptions on File Prior to Visit  Medication Sig Dispense Refill  . amLODipine (NORVASC) 5 MG tablet TAKE ONE TABLET BY MOUTH ONE TIME DAILY  90 tablet  3  . aspirin 81 MG tablet Take 81 mg by mouth daily.        [name] atenolol (TENORMIN) 25 MG tablet TAKE ONE TABLET BY MOUTH ONE TIME DAILY  90 tablet  3  . atorvastatin (LIPITOR) 20 MG tablet TAKE ONE TABLET BY MOUTH ONE TIME DAILY  90 tablet  3  . benazepril (LOTENSIN) 20 MG tablet TAKE ONE TABLET BY MOUTH ONE TIME DAILY  90 tablet  3  . buPROPion (WELLBUTRIN XL) 300 MG 24 hr tablet  Take 1 tablet (300 mg total) by mouth daily.  30 tablet  6  . clonazePAM (KLONOPIN) 1 MG tablet 1/2 to 1 tab by mouth twice per day as needed  60 tablet  2  . cyclobenzaprine (FLEXERIL) 5 MG tablet Take 1 tablet (5 mg total) by mouth 3 (three) times daily as needed for muscle spasms.  90 tablet  1  . diclofenac sodium (VOLTAREN) 1 % GEL Apply 1 application topically 3 (three) times daily.        [name] EFFIENT 10 MG TABS TAKE ONE TABLET BY MOUTH ONE TIME DAILY  30 each  2  . FLUoxetine (PROZAC) 20 MG capsule TAKE ONE CAPSULE BY MOUTH ONE TIME DAILY  30 capsule  6  .  fluticasone (FLONASE) 50 MCG/ACT nasal spray USE 2 SPRAYS INTO EACH NOSTRIL ONCE DAILY  16 g  11  . gabapentin (NEURONTIN) 800 MG tablet TAKE ONE TABLET BY MOUTH FOUR TIMES DAILY  120 tablet  5  . indomethacin (INDOCIN) 25 MG capsule TAKE ONE CAPSULE BY MOUTH FOUR TIMES DAILY  120 capsule  6  . isosorbide mononitrate (IMDUR) 60 MG 24 hr tablet TAKE ONE AND ONE-HALF TABLETS BY MOUTH DAILY  45 tablet  2  . mirtazapine (REMERON) 30 MG tablet Take 0.5 tablets (15 mg total) by mouth at bedtime.  90 tablet  3  . nitroGLYCERIN (NITROSTAT) 0.4 MG SL tablet Place 1 tablet (0.4 mg total) under the tongue every 5 (five) minutes as needed for chest pain.  25 tablet  3  . oxyCODONE (ROXICODONE) 15 MG immediate release tablet Take 1 tablet (15 mg total) by mouth 5 (five) times daily as needed for pain.  150 tablet  0  . prasugrel (EFFIENT) 10 MG TABS Take 1 tablet (10 mg total) by mouth daily.  30 tablet  1  . traMADol (ULTRAM) 50 MG tablet Take 1 tablet (50 mg total) by mouth 2 (two) times daily as needed for pain.  60 tablet  1  . traZODone (DESYREL) 50 MG tablet TAKE 1/2 OR 1 TABLET AT BEDTIME AS NEEDED FOR SLEEP  90 tablet  1  . vardenafil (LEVITRA) 20 MG tablet Take 1 tablet (20 mg total) by mouth every other day. Reports he is rarely using. He is aware that he is on Imdur  10 tablet  0  . zolpidem (AMBIEN) 10 MG tablet TAKE 2 TABLETS BY MOUTH EVERY NIGHT AT BEDTIME AS NEEDED  180 tablet  1   Review of Systems Review of Systems  Constitutional: Negative for diaphoresis, activity change, appetite change and unexpected weight change.  HENT: Negative for hearing loss, ear pain, facial swelling, mouth sores and neck stiffness.   Eyes: Negative for pain, redness and visual disturbance.  Respiratory: Negative for shortness of breath and wheezing.   Cardiovascular: Negative for chest pain and palpitations.  Gastrointestinal: Negative for diarrhea, blood in stool, abdominal distention and rectal pain.   Genitourinary: Negative for hematuria, flank pain and decreased urine volume.  Musculoskeletal: Negative for myalgias and joint swelling.  Skin: Negative for color change and wound.  Neurological: Negative for syncope and numbness.  Hematological: Negative for adenopathy.  Psychiatric/Behavioral: Negative for hallucinations, self-injury, decreased concentration and agitation.      Objective:   Physical Exam BP 120/70  Pulse 49  Temp 98.3 F (36.8 C) (Oral)  Ht 6' 1"  (1.854 m)  Wt 320 lb (145.151 kg)  BMI 42.22 kg/m2  SpO2 97% Physical Exam  VS noted Constitutional:  Pt is oriented to person, place, and time. Appears well-developed and well-nourished. /morbid obese HENT:  Head: Normocephalic and atraumatic.  Right Ear: External ear normal.  Left Ear: External ear normal.  Nose: Nose normal.  Mouth/Throat: Oropharynx is clear and moist.  Eyes: Conjunctivae and EOM are normal. Pupils are equal, round, and reactive to light.  Neck: Normal range of motion. Neck supple. No JVD present. No tracheal deviation present.  Cardiovascular: Normal rate, regular rhythm, normal heart sounds and intact distal pulses.   Pulmonary/Chest: Effort normal and breath sounds normal.  Abdominal: Soft. Bowel sounds are normal. There is no tenderness.  Musculoskeletal: Normal range of motion. Exhibits no edema.  Lymphadenopathy:  Has no cervical adenopathy.  Neurological: Pt is alert and oriented to person, place, and time. Pt has normal reflexes. No cranial nerve deficit.  Skin: Skin is warm and dry. No rash noted.  Psychiatric:  Has  normal mood and affect. Behavior is normal.    Assessment & Plan:

## 2012-05-24 ENCOUNTER — Other Ambulatory Visit: Payer: Self-pay

## 2012-05-24 MED ORDER — CLONAZEPAM 1 MG PO TABS: ORAL_TABLET | ORAL | Status: DC

## 2012-05-24 NOTE — Telephone Encounter (Signed)
Faxed hardcopy to pharmacy. 

## 2012-05-24 NOTE — Telephone Encounter (Signed)
Done hardcopy to [facility]  

## 2012-05-27 ENCOUNTER — Telehealth: Payer: Self-pay | Admitting: *Deleted

## 2012-05-27 MED ORDER — OXYCODONE HCL 15 MG PO TABS: 15.0000 mg | ORAL_TABLET | Freq: Every day | ORAL | Status: DC | PRN

## 2012-05-27 NOTE — Telephone Encounter (Signed)
Patient needs prescription for Oxycodone.

## 2012-05-27 NOTE — Telephone Encounter (Signed)
Prescription printed for Santiago Glad to sign

## 2012-05-27 NOTE — Telephone Encounter (Signed)
Patient informed script is ready for pick up.

## 2012-06-03 ENCOUNTER — Telehealth: Payer: Self-pay | Admitting: Internal Medicine

## 2012-06-03 NOTE — Telephone Encounter (Signed)
Patient informed. 

## 2012-06-03 NOTE — Telephone Encounter (Signed)
The patient has been on a diet, drinking a lot of fluids and states he has gained 10 lbs. He has gone from a size 58 to 46.   The patient thinks he may need something for fluid??

## 2012-06-03 NOTE — Telephone Encounter (Signed)
Called the patient left message to call back

## 2012-06-03 NOTE — Telephone Encounter (Signed)
Patient left message on triage requesting a call back from Travis [name], he is wanting to have something called in to help with the water weight he is retaining

## 2012-06-03 NOTE — Telephone Encounter (Signed)
Not sure how the size would go down, with wt increased  Please moderate (reduce) the fluids as he likely does not need that much if the wt gain is true, and consider OV if wt does not decrease in the next few days as the kidneys work to get rid of extra fluid

## 2012-06-05 ENCOUNTER — Other Ambulatory Visit: Payer: Self-pay

## 2012-06-05 MED ORDER — CYCLOBENZAPRINE HCL 5 MG PO TABS: 5.0000 mg | ORAL_TABLET | Freq: Three times a day (TID) | ORAL | Status: DC | PRN

## 2012-06-11 ENCOUNTER — Other Ambulatory Visit: Payer: Self-pay

## 2012-06-11 MED ORDER — INDOMETHACIN 25 MG PO CAPS: 25.0000 mg | ORAL_CAPSULE | Freq: Four times a day (QID) | ORAL | Status: DC

## 2012-06-17 ENCOUNTER — Telehealth: Payer: Self-pay | Admitting: Internal Medicine

## 2012-06-17 MED ORDER — TIZANIDINE HCL 4 MG PO TABS: 4.0000 mg | ORAL_TABLET | Freq: Four times a day (QID) | ORAL | Status: DC | PRN

## 2012-06-17 NOTE — Telephone Encounter (Signed)
Patient informed rx changed and sent in to pharmacy

## 2012-06-17 NOTE — Telephone Encounter (Signed)
Ok to try change to zanaflex prn - done erx

## 2012-06-17 NOTE — Telephone Encounter (Signed)
Pt called to say he has changed insurance to [employer].  This insurance is requiring a PA for the Cyclobenzaprine (Flexeril). His insurance ID is [id number].  The pharmacy number on the card is [phone], but he uses [facility] on [address].

## 2012-06-24 ENCOUNTER — Encounter: Payer: Self-pay | Admitting: Internal Medicine

## 2012-06-24 ENCOUNTER — Ambulatory Visit (INDEPENDENT_AMBULATORY_CARE_PROVIDER_SITE_OTHER): Payer: Medicare Other | Admitting: Internal Medicine

## 2012-06-24 VITALS — BP 132/82 | HR 55 | Temp 98.5°F

## 2012-06-24 DIAGNOSIS — J329 Chronic sinusitis, unspecified: Secondary | ICD-10-CM

## 2012-06-24 MED ORDER — AZITHROMYCIN 250 MG PO TABS: ORAL_TABLET | ORAL | Status: DC

## 2012-06-24 NOTE — Progress Notes (Signed)
HPI  Pt presents to the clinic today with c/o headache, sinus pain and pressure and nasal congestion x 2 weeks. He has been taking Sudafed, Mucinex, and flonase which has helped a little but the symptoms seem to be getting worse each day. He does have a history of allergies. He has had sick contacts.  Review of Systems    Past Medical History  Diagnosis Date  . Impaired glucose tolerance 09/27/2010  . HYPERLIPIDEMIA-MIXED 07/15/2009  . DEPRESSION 06/24/2009  . Chronic pain syndrome 10/27/2009  . HYPERTENSION 06/24/2009  . CORONARY ARTERY DISEASE 06/24/2009    s/p multiple PCIs  . URETHRAL STRICTURE 06/24/2009  . DEGENERATIVE JOINT DISEASE 06/24/2009  . SHOULDER PAIN, BILATERAL 06/24/2009  . ANKLE PAIN, RIGHT 06/24/2009  . [place] DISEASE, LUMBAR 04/19/2010  . SCIATICA, LEFT 04/19/2010  . FATIGUE 06/24/2009  . NEPHROLITHIASIS, HX OF 06/24/2009  . ERECTILE DYSFUNCTION, ORGANIC 05/30/2010  . OSA (obstructive sleep apnea)     not using CPAP very often  . Obesity   . Allergy to clopidogrel   . ALLERGIC RHINITIS 06/24/2009  . Anxiety 11/12/2011    Family History  Problem Relation Age of Onset  . Depression Mother   . Heart disease Mother   . Hypertension Mother   . Cancer Mother     Breast  . Diabetes Father   . Heart disease Father     CABG  . Cancer Father     prostate and skin cancer  . Hypertension Father   . Hyperlipidemia Father   . Depression Brother     x 2  . Hypertension Brother     x2  . Coronary artery disease Other   . Hypertension Other   . Depression Other   . Heart disease Maternal Grandfather   . Early death Maternal Grandfather 32    heart attack  . Early death Paternal Grandfather     History   Social History  . Marital Status: Divorced    Spouse Name: N/A    Number of Children: 2  . Years of Education: N/A   Occupational History  . disabled since 2006 due to ortho. heart, psych   . part time work as an Multimedia programmer, wrestling, and baseball Immunologist    Social History Main Topics  . Smoking status: Former Smoker    Types: Cigars    Quit date: 08/28/2010  . Smokeless tobacco: Never Used     Comment: rare  . Alcohol Use: 0.0 oz/week     Comment: occasional  . Drug Use: No  . Sexually Active: Not Currently   Other Topics Concern  . Not on file   Social History Narrative   Played semi-pro football, used steroidsDivorced moved here from [place], [place]    No Known Allergies   Constitutional: Positive headache, fatigue and fever. Denies abrupt weight changes.  HEENT:  Positive eye pain, pressure behind the eyes, facial pain, nasal congestion and sore throat. Denies eye redness, ear pain, ringing in the ears, wax buildup, runny nose or bloody nose. Respiratory: Positive cough. Denies difficulty breathing or shortness of breath.  Cardiovascular: Denies chest pain, chest tightness, palpitations or swelling in the hands or feet.   No other specific complaints in a complete review of systems (except as listed in HPI above).  Objective:    BP 132/82  Pulse 55  Temp 98.5 F (36.9 C) (Oral)  SpO2 96% Wt Readings from Last 3 Encounters:  05/06/12 320 lb (145.151 kg)  04/29/12 333 lb 12.8 oz (151.411 kg)  02/27/12 344 lb (156.037 kg)    General: Appears his stated age, well developed, well nourished in NAD. HEENT: Head: normal shape and size; Eyes: sclera white, no icterus, conjunctiva pink, PERRLA and EOMs intact; Ears: Tm's gray and intact, normal light reflex; Nose: mucosa pink and moist, septum midline; Throat/Mouth: + PND. Teeth present, mucosa erythematous and moist, no exudate noted, no lesions or ulcerations noted.  Neck: Mild cervical lymphadenopathy. Neck supple, trachea midline. No massses, lumps or thyromegaly present.  Cardiovascular: Normal rate and rhythm. S1,S2 noted.  No murmur, rubs or gallops noted. No JVD or BLE edema. No carotid bruits noted. Pulmonary/Chest: Normal effort and positive vesicular breath  sounds. No respiratory distress. No wheezes, rales or ronchi noted.      Assessment & Plan:   Acute bacterial sinusitis  Can use a Neti Pot which can be purchased from your local drug store. Flonase 2 sprays each nostril for 3 days and then as needed. Azithromax x 5 days  RTC as needed or if symptoms persist.

## 2012-06-24 NOTE — Patient Instructions (Signed)

## 2012-06-25 ENCOUNTER — Encounter (HOSPITAL_COMMUNITY): Payer: Self-pay

## 2012-06-25 ENCOUNTER — Ambulatory Visit (INDEPENDENT_AMBULATORY_CARE_PROVIDER_SITE_OTHER): Payer: Medicare Other | Admitting: Psychology

## 2012-06-25 ENCOUNTER — Emergency Department (HOSPITAL_COMMUNITY)
Admission: EM | Admit: 2012-06-25 | Discharge: 2012-06-25 | Disposition: A | Discharge status: Other Acute Inpatient Hospital | Payer: Medicare Other | Source: Home / Self Care | Attending: Emergency Medicine | Admitting: Emergency Medicine

## 2012-06-25 ENCOUNTER — Encounter (HOSPITAL_COMMUNITY): Payer: Self-pay | Admitting: *Deleted

## 2012-06-25 ENCOUNTER — Other Ambulatory Visit: Payer: Self-pay

## 2012-06-25 ENCOUNTER — Inpatient Hospital Stay (HOSPITAL_COMMUNITY)
Admission: RE | Admit: 2012-06-25 | Discharge: 2012-06-28 | DRG: 885 | Disposition: A | Discharge status: Home / Self Care | Payer: Medicare Other | Attending: Psychiatry | Admitting: Psychiatry

## 2012-06-25 DIAGNOSIS — I10 Essential (primary) hypertension: Secondary | ICD-10-CM | POA: Diagnosis present

## 2012-06-25 DIAGNOSIS — J329 Chronic sinusitis, unspecified: Secondary | ICD-10-CM

## 2012-06-25 DIAGNOSIS — G894 Chronic pain syndrome: Secondary | ICD-10-CM | POA: Diagnosis present

## 2012-06-25 DIAGNOSIS — F332 Major depressive disorder, recurrent severe without psychotic features: Secondary | ICD-10-CM | POA: Diagnosis present

## 2012-06-25 DIAGNOSIS — I2510 Atherosclerotic heart disease of native coronary artery without angina pectoris: Secondary | ICD-10-CM | POA: Diagnosis present

## 2012-06-25 DIAGNOSIS — F419 Anxiety disorder, unspecified: Secondary | ICD-10-CM

## 2012-06-25 DIAGNOSIS — Z79899 Other long term (current) drug therapy: Secondary | ICD-10-CM

## 2012-06-25 DIAGNOSIS — R45851 Suicidal ideations: Secondary | ICD-10-CM | POA: Diagnosis not present

## 2012-06-25 DIAGNOSIS — F331 Major depressive disorder, recurrent, moderate: Secondary | ICD-10-CM

## 2012-06-25 DIAGNOSIS — F322 Major depressive disorder, single episode, severe without psychotic features: Secondary | ICD-10-CM

## 2012-06-25 DIAGNOSIS — F411 Generalized anxiety disorder: Secondary | ICD-10-CM | POA: Diagnosis present

## 2012-06-25 HISTORY — DX: Depression, unspecified: F32.A

## 2012-06-25 HISTORY — DX: Major depressive disorder, single episode, unspecified: F32.9

## 2012-06-25 LAB — URINE MICROSCOPIC-ADD ON

## 2012-06-25 LAB — COMPREHENSIVE METABOLIC PANEL
ALT: 17 U/L (ref 0–53)
AST: 22 U/L (ref 0–37)
Calcium: 9.2 mg/dL (ref 8.4–10.5)
Creatinine, Ser: 1.18 mg/dL (ref 0.50–1.35)
GFR calc Af Amer: 76 mL/min — ABNORMAL LOW (ref >90)
GFR calc non Af Amer: 65 mL/min — ABNORMAL LOW (ref >90)
Glucose, Bld: 131 mg/dL — ABNORMAL HIGH (ref 70–99)
Sodium: 140 mEq/L (ref 135–145)
Total Protein: 7.5 g/dL (ref 6.0–8.3)

## 2012-06-25 LAB — URINALYSIS, ROUTINE W REFLEX MICROSCOPIC
Glucose, UA: NEGATIVE mg/dL
Hgb urine dipstick: NEGATIVE
Ketones, ur: NEGATIVE mg/dL
Protein, ur: NEGATIVE mg/dL
pH: 5.5 (ref 5.0–8.0)

## 2012-06-25 LAB — CBC WITH DIFFERENTIAL/PLATELET
Eosinophils Absolute: 0.1 10*3/uL (ref 0.0–0.7)
Eosinophils Relative: 2 % (ref 0–5)
HCT: 40 % (ref 39.0–52.0)
Hemoglobin: 13.7 g/dL (ref 13.0–17.0)
Lymphs Abs: 1.5 10*3/uL (ref 0.7–4.0)
MCH: 30.5 pg (ref 26.0–34.0)
MCV: 89.1 fL (ref 78.0–100.0)
Monocytes Absolute: 0.4 10*3/uL (ref 0.1–1.0)
Monocytes Relative: 8 % (ref 3–12)
RBC: 4.49 MIL/uL (ref 4.22–5.81)

## 2012-06-25 LAB — ETHANOL: Alcohol, Ethyl (B): <11 mg/dL (ref 0–11)

## 2012-06-25 LAB — RAPID URINE DRUG SCREEN, HOSP PERFORMED
Amphetamines: NOT DETECTED
Benzodiazepines: NOT DETECTED
Tetrahydrocannabinol: NOT DETECTED

## 2012-06-25 MED ORDER — AMLODIPINE BESYLATE 5 MG PO TABS
5.0000 mg | ORAL_TABLET | Freq: Every day | ORAL | Status: DC
  Administered 2012-06-26 – 2012-06-28 (×3): 5 mg via ORAL
  Filled 2012-06-25 (×5): qty 1

## 2012-06-25 MED ORDER — PRASUGREL HCL 10 MG PO TABS
10.0000 mg | ORAL_TABLET | Freq: Every day | ORAL | Status: DC
  Administered 2012-06-26 – 2012-06-28 (×3): 10 mg via ORAL
  Filled 2012-06-25 (×5): qty 1

## 2012-06-25 MED ORDER — FLUTICASONE PROPIONATE 50 MCG/ACT NA SUSP
2.0000 | Freq: Every day | NASAL | Status: DC
  Administered 2012-06-26 – 2012-06-28 (×3): 2 via NASAL
  Filled 2012-06-25: qty 16

## 2012-06-25 MED ORDER — GABAPENTIN 400 MG PO CAPS
400.0000 mg | ORAL_CAPSULE | Freq: Two times a day (BID) | ORAL | Status: DC
  Administered 2012-06-25 – 2012-06-28 (×6): 400 mg via ORAL
  Filled 2012-06-25 (×11): qty 1

## 2012-06-25 MED ORDER — ATORVASTATIN CALCIUM 20 MG PO TABS
20.0000 mg | ORAL_TABLET | Freq: Every day | ORAL | Status: DC
  Administered 2012-06-26 – 2012-06-27 (×2): 20 mg via ORAL
  Filled 2012-06-25 (×4): qty 1

## 2012-06-25 MED ORDER — ATENOLOL 25 MG PO TABS
25.0000 mg | ORAL_TABLET | Freq: Every day | ORAL | Status: DC
  Administered 2012-06-28: 25 mg via ORAL
  Filled 2012-06-25 (×5): qty 1

## 2012-06-25 MED ORDER — AZITHROMYCIN 250 MG PO TABS
250.0000 mg | ORAL_TABLET | Freq: Every day | ORAL | Status: AC
  Administered 2012-06-26 – 2012-06-28 (×3): 250 mg via ORAL
  Filled 2012-06-25 (×4): qty 1

## 2012-06-25 MED ORDER — BENAZEPRIL HCL 5 MG PO TABS
5.0000 mg | ORAL_TABLET | Freq: Every day | ORAL | Status: DC
  Administered 2012-06-26 – 2012-06-28 (×3): via ORAL
  Filled 2012-06-25 (×5): qty 1

## 2012-06-25 MED ORDER — ALUM & MAG HYDROXIDE-SIMETH 200-200-20 MG/5ML PO SUSP: 30.0000 mL | ORAL | Status: DC | PRN

## 2012-06-25 MED ORDER — BUPROPION HCL ER (XL) 300 MG PO TB24
300.0000 mg | ORAL_TABLET | Freq: Every day | ORAL | Status: DC
  Administered 2012-06-26 – 2012-06-28 (×3): 300 mg via ORAL
  Filled 2012-06-25 (×5): qty 1

## 2012-06-25 MED ORDER — LORAZEPAM 2 MG/ML IJ SOLN: 1.0000 mg | Freq: Once | INTRAMUSCULAR | Status: DC

## 2012-06-25 MED ORDER — OXYCODONE HCL 5 MG PO TABS
15.0000 mg | ORAL_TABLET | Freq: Once | ORAL | Status: AC
  Administered 2012-06-25: 15 mg via ORAL
  Filled 2012-06-25: qty 3

## 2012-06-25 MED ORDER — INDOMETHACIN 25 MG PO CAPS
25.0000 mg | ORAL_CAPSULE | Freq: Four times a day (QID) | ORAL | Status: DC
  Administered 2012-06-25 – 2012-06-28 (×11): 25 mg via ORAL
  Filled 2012-06-25 (×19): qty 1

## 2012-06-25 MED ORDER — TRAMADOL HCL 50 MG PO TABS
50.0000 mg | ORAL_TABLET | Freq: Four times a day (QID) | ORAL | Status: DC | PRN
  Administered 2012-06-26: 50 mg via ORAL
  Filled 2012-06-25: qty 1

## 2012-06-25 MED ORDER — LORAZEPAM 2 MG/ML IJ SOLN
2.0000 mg | Freq: Once | INTRAMUSCULAR | Status: AC
  Administered 2012-06-25: 2 mg via INTRAMUSCULAR
  Filled 2012-06-25 (×2): qty 1

## 2012-06-25 MED ORDER — NITROGLYCERIN 0.4 MG SL SUBL: 0.4000 mg | SUBLINGUAL_TABLET | SUBLINGUAL | Status: DC | PRN

## 2012-06-25 MED ORDER — MAGNESIUM HYDROXIDE 400 MG/5ML PO SUSP: 30.0000 mL | Freq: Every day | ORAL | Status: DC | PRN

## 2012-06-25 MED ORDER — ASPIRIN 81 MG PO CHEW
81.0000 mg | CHEWABLE_TABLET | Freq: Every day | ORAL | Status: DC
  Administered 2012-06-26 – 2012-06-28 (×3): 81 mg via ORAL
  Filled 2012-06-25 (×5): qty 1

## 2012-06-25 MED ORDER — CLONAZEPAM 1 MG PO TABS
1.0000 mg | ORAL_TABLET | Freq: Two times a day (BID) | ORAL | Status: DC
  Administered 2012-06-25 – 2012-06-28 (×6): 1 mg via ORAL
  Filled 2012-06-25 (×6): qty 1

## 2012-06-25 MED ORDER — ISOSORBIDE MONONITRATE ER 30 MG PO TB24
30.0000 mg | ORAL_TABLET | Freq: Every day | ORAL | Status: DC
  Administered 2012-06-28: 30 mg via ORAL
  Filled 2012-06-25 (×5): qty 1

## 2012-06-25 MED ORDER — INFLUENZA VIRUS VACC SPLIT PF IM SUSP
0.5000 mL | INTRAMUSCULAR | Status: AC
  Administered 2012-06-26: 0.5 mL via INTRAMUSCULAR

## 2012-06-25 MED ORDER — TRAZODONE HCL 50 MG PO TABS
50.0000 mg | ORAL_TABLET | Freq: Every evening | ORAL | Status: DC | PRN
  Administered 2012-06-25 – 2012-06-27 (×6): 50 mg via ORAL
  Filled 2012-06-25 (×12): qty 1

## 2012-06-25 MED ORDER — ACETAMINOPHEN 325 MG PO TABS: 650.0000 mg | ORAL_TABLET | Freq: Four times a day (QID) | ORAL | Status: DC | PRN
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
## 2012-06-25 NOTE — ED Provider Notes (Signed)
History   This chart was scribed for Travis [name], a non-physician practitioner working with Julianne Rice, MD by Denice Bors, ED Scribe. This patient was seen in room WLCON/WLCON and the patient's care was started at 3:45 pm.    CSN: [id number]  Arrival date & time 06/25/12  1435   None     Chief Complaint  Patient presents with  . Medical Clearance    (Consider location/radiation/quality/duration/timing/severity/associated sxs/prior treatment) HPI  Level 5 caveat as patient is uncooperative, tearful, anxious and angry. Does not want to be here   Bertran Zeimet is a 62 y.o. male who presents to the Emergency Department complaining of gradually worsening constant depression onset 1 year. Pt states "he's been battling depression for 30 years." Pt states his PCP 'made' him come to ED to be evaluated today. Pt states he went to PCP for Abilify. Pt states PCP was concerned "he would hurt himself." Pt reports depression, anxiety,  Pt denies auditory hallucinations, visual hallucinations, suicidal ideations and homicidal ideations.   Past Medical History  Diagnosis Date  . Impaired glucose tolerance 09/27/2010  . HYPERLIPIDEMIA-MIXED 07/15/2009  . DEPRESSION 06/24/2009  . Chronic pain syndrome 10/27/2009  . HYPERTENSION 06/24/2009  . CORONARY ARTERY DISEASE 06/24/2009    s/p multiple PCIs  . URETHRAL STRICTURE 06/24/2009  . DEGENERATIVE JOINT DISEASE 06/24/2009  . SHOULDER PAIN, BILATERAL 06/24/2009  . ANKLE PAIN, RIGHT 06/24/2009  . [place] DISEASE, LUMBAR 04/19/2010  . SCIATICA, LEFT 04/19/2010  . FATIGUE 06/24/2009  . NEPHROLITHIASIS, HX OF 06/24/2009  . ERECTILE DYSFUNCTION, ORGANIC 05/30/2010  . OSA (obstructive sleep apnea)     not using CPAP very often  . Obesity   . Allergy to clopidogrel   . ALLERGIC RHINITIS 06/24/2009  . Anxiety 11/12/2011  . Anxiety   . Depression     Past Surgical History  Procedure Date  . Tonsillectomy   . Rotator cuff repair     bilateral  . S/p  knee replacement 2008  . S/p bilat wrist surgury   . S/p urethral dilations, multi 2009,2010  . Carpal tunnel release   . S/p right knee arthroscopy 03/2005    and multi prior  . S/p umbilical hernia   . S/p left knee arthroscopy june 2002    multi prior  . S/p right shoulder rotater cuff surgury april 2011    Dr. [name] Lighter    Family History  Problem Relation Age of Onset  . Depression Mother   . Heart disease Mother   . Hypertension Mother   . Cancer Mother     Breast  . Diabetes Father   . Heart disease Father     CABG  . Cancer Father     prostate and skin cancer  . Hypertension Father   . Hyperlipidemia Father   . Depression Brother     x 2  . Hypertension Brother     x2  . Coronary artery disease Other   . Hypertension Other   . Depression Other   . Heart disease Maternal Grandfather   . Early death Maternal Grandfather 12    heart attack  . Early death Paternal Grandfather     History  Substance Use Topics  . Smoking status: Former Smoker    Types: Cigars    Quit date: 08/28/2010  . Smokeless tobacco: Never Used     Comment: rare  . Alcohol Use: 0.0 oz/week     Comment: occasional      Review of Systems  Constitutional: Negative.   HENT: Negative.   Respiratory: Negative.   Cardiovascular: Negative.   Gastrointestinal: Negative.   Musculoskeletal: Negative.   Skin: Negative.   Neurological: Negative.   Hematological: Negative.   Psychiatric/Behavioral: Positive for suicidal ideas. The patient is nervous/anxious.        Depression  All other systems reviewed and are negative.   A complete 10 system review of systems was obtained and all systems are negative except as noted in the HPI and PMH.    Allergies  Review of patient's allergies indicates no known allergies.  Home Medications   Current Outpatient Rx  Name  Route  Sig  Dispense  Refill  . AMLODIPINE BESYLATE 5 MG PO TABS      TAKE ONE TABLET BY MOUTH ONE TIME DAILY   90 tablet    3   . ASPIRIN 81 MG PO TABS   Oral   Take 81 mg by mouth daily.           . ATENOLOL 25 MG PO TABS      TAKE ONE TABLET BY MOUTH ONE TIME DAILY   90 tablet   3   . ATORVASTATIN CALCIUM 20 MG PO TABS      TAKE ONE TABLET BY MOUTH ONE TIME DAILY   90 tablet   3   . AZITHROMYCIN 250 MG PO TABS      Take 2 tablets today, then 1 tablet daily for 4 days   6 tablet   0   . BENAZEPRIL HCL 20 MG PO TABS      TAKE ONE TABLET BY MOUTH ONE TIME DAILY   90 tablet   3   . BUPROPION HCL ER (XL) 300 MG PO TB24   Oral   Take 1 tablet (300 mg total) by mouth daily.   30 tablet   6   . CLONAZEPAM 1 MG PO TABS      1/2 to 1 tab by mouth twice per day as needed   60 tablet   2   . DICLOFENAC SODIUM 1 % TD GEL   Topical   Apply 1 application topically 3 (three) times daily.           [name] FLUOXETINE HCL 20 MG PO CAPS      TAKE ONE CAPSULE BY MOUTH ONE TIME DAILY   30 capsule   6   . FLUTICASONE PROPIONATE 50 MCG/ACT NA SUSP      USE 2 SPRAYS INTO EACH NOSTRIL ONCE DAILY   16 g   11   . GABAPENTIN 800 MG PO TABS      TAKE ONE TABLET BY MOUTH FOUR TIMES DAILY   120 tablet   5   . INDOMETHACIN 25 MG PO CAPS   Oral   Take 1 capsule (25 mg total) by mouth 4 (four) times daily.   120 capsule   6   . ISOSORBIDE MONONITRATE ER 60 MG PO TB24      TAKE ONE AND ONE-HALF TABLETS BY MOUTH DAILY   45 tablet   2     Needs a follow up appointment   . MIRTAZAPINE 30 MG PO TABS   Oral   Take 0.5 tablets (15 mg total) by mouth at bedtime.   90 tablet   3   . NITROGLYCERIN 0.4 MG SL SUBL   Sublingual   Place 1 tablet (0.4 mg total) under the tongue every 5 (five) minutes as needed for chest pain.  25 tablet   3   . OXYCODONE HCL 15 MG PO TABS   Oral   Take 1 tablet (15 mg total) by mouth 5 (five) times daily as needed for pain.   150 tablet   0   . PRASUGREL HCL 10 MG PO TABS   Oral   Take 1 tablet (10 mg total) by mouth daily.   30 tablet   1      Patient needs to call for appointment   . TIZANIDINE HCL 4 MG PO TABS   Oral   Take 1 tablet (4 mg total) by mouth every 6 (six) hours as needed.   60 tablet   2   . TRAZODONE HCL 50 MG PO TABS      TAKE 1/2 OR 1 TABLET AT BEDTIME AS NEEDED FOR SLEEP   90 tablet   1     BP 127/73  Pulse 80  Temp 98.6 F (37 C) (Oral)  Resp 20  SpO2 98%  Physical Exam  Nursing note and vitals reviewed. Constitutional: He is oriented to person, place, and time. He appears well-developed and well-nourished. No distress.       Pt crying on exam  HENT:  Head: Normocephalic and atraumatic.  Eyes: EOM are normal.  Neck: Neck supple. No tracheal deviation present.  Cardiovascular: Normal rate.   Pulmonary/Chest: Effort normal. No respiratory distress.  Abdominal: Soft.  Musculoskeletal: Normal range of motion.  Neurological: He is alert and oriented to person, place, and time.  Skin: Skin is warm and dry.  Psychiatric: His behavior is normal. His mood appears anxious. His speech is rapid and/or pressured. He is not actively hallucinating. Thought content is not delusional. He exhibits a depressed mood. He expresses no homicidal and no suicidal ideation.    ED Course  Procedures (including critical care time)  4:22 PM: Pt states he is upset and wants to leave. Called PCP and pt was not sen in primary care's office today. Questioned pt about it and he states he was seen by a psychiatrist.   4:28 PM: 4 weeks ago and 2 weeks ago attempted suicide by trying to overdose on wine and xanax. Pt seen at Dr. Orion Crook office and told psychologist (at [facility]) he attempted suicide 2 times in the last month.   4:57 PM Pt seen by Dr. Lita Mains and recommends IVC.    IV Ccompleted pt transferred to [facility].     Medications  LORazepam (ATIVAN) injection 2 mg (2 mg Intramuscular Given 06/25/12 1643)    Labs Reviewed  URINALYSIS, ROUTINE W REFLEX MICROSCOPIC - Abnormal; Notable for the following:     APPearance CLOUDY (*)     Leukocytes, UA MODERATE (*)     All other components within normal limits  COMPREHENSIVE METABOLIC PANEL - Abnormal; Notable for the following:    Glucose, Bld 131 (*)     GFR calc non Af Amer 65 (*)     GFR calc Af Amer 76 (*)     All other components within normal limits  URINE MICROSCOPIC-ADD ON - Abnormal; Notable for the following:    Squamous Epithelial / LPF FEW (*)     Bacteria, UA FEW (*)     All other components within normal limits  CBC WITH DIFFERENTIAL  URINE RAPID DRUG SCREEN (HOSP PERFORMED)  ETHANOL  URINE CULTURE   No results found.   1. Severe depression   2. Anxiety       MDM  Pt sent  from PCP office for being on "edge of a psychotic break".  Was taken to behavioral health when they sent him to [facility] for medical clearance. Pt has bed at behavioral health pending.     I personally performed the services described in this documentation, which was scribed in my presence. The recorded information has been reviewed and is accurate.    Linus Mako, PA 06/25/12 [place], PA 06/25/12 [place], PA 06/25/12 2226

## 2012-06-25 NOTE — ED Notes (Signed)
Pt reports that he just wanted PCP to prescribe Abilify.

## 2012-06-25 NOTE — ED Notes (Signed)
Travis [name] with Security in to wand pt and pt's personal belongings. 2 bags of labelled, personal belongings placed behind nurses' station in triage.

## 2012-06-25 NOTE — ED Notes (Addendum)
Per [facility] staff, pt was seen at [facility] this morning around 1100 and due to depression and SI, MD escorted pt to [facility] to be evaluated. [facility] then brought pt here for medical clearance. [facility] staff reports that pt has a bed pending medical clearance.

## 2012-06-25 NOTE — ED Notes (Signed)
Very tearful states that he has lost everything. Tiffany PA-C ordered Ativan to be given and security will take patient back to [facility].

## 2012-06-25 NOTE — Tx Team (Signed)
Initial Interdisciplinary Treatment Plan  PATIENT STRENGTHS: (choose at least two) Ability for insight Average or above average intelligence  PATIENT STRESSORS: Financial difficulties Loss of relationship with girlfriend of 3 years*   PROBLEM LIST: Problem List/Patient Goals Date to be addressed Date deferred Reason deferred Estimated date of resolution  Depression  06/25/2013     SI      Anxiety             Goal:Patient reports hopefully find a medication to help with his depression and get on with his life                               DISCHARGE CRITERIA:  Ability to meet basic life and health needs Adequate post-discharge living arrangements Improved stabilization in mood, thinking, and/or behavior Need for constant or close observation no longer present  PRELIMINARY DISCHARGE PLAN: Outpatient therapy Return to previous living arrangement  PATIENT/FAMIILY INVOLVEMENT: This treatment plan has been presented to and reviewed with the patient, Travis [name], and/or family member.  The patient and family have been given the opportunity to ask questions and make suggestions.  Aurora Mask 06/25/2012, 9:05 PM

## 2012-06-25 NOTE — ED Notes (Signed)
IVC papers faxed to [facility] assessment and stated that they will call when they have approved.

## 2012-06-26 ENCOUNTER — Telehealth: Payer: Self-pay

## 2012-06-26 DIAGNOSIS — F411 Generalized anxiety disorder: Secondary | ICD-10-CM

## 2012-06-26 DIAGNOSIS — F332 Major depressive disorder, recurrent severe without psychotic features: Principal | ICD-10-CM

## 2012-06-26 LAB — URINE CULTURE

## 2012-06-26 MED ORDER — OXYCODONE HCL 5 MG PO TABS
15.0000 mg | ORAL_TABLET | Freq: Three times a day (TID) | ORAL | Status: DC
  Administered 2012-06-26 – 2012-06-28 (×6): 15 mg via ORAL
  Filled 2012-06-26 (×6): qty 3

## 2012-06-26 MED ORDER — FLUOXETINE HCL 20 MG PO CAPS
40.0000 mg | ORAL_CAPSULE | Freq: Every day | ORAL | Status: DC
  Administered 2012-06-26 – 2012-06-28 (×3): 40 mg via ORAL
  Filled 2012-06-26: qty 14
  Filled 2012-06-26 (×4): qty 2

## 2012-06-26 MED ORDER — OXYCODONE HCL 5 MG PO TABS
15.0000 mg | ORAL_TABLET | Freq: Three times a day (TID) | ORAL | Status: DC
  Administered 2012-06-26: 15 mg via ORAL
  Filled 2012-06-26: qty 3

## 2012-06-26 NOTE — Tx Team (Signed)
Interdisciplinary Treatment Plan Update (Adult)  Date:  06/26/2012  Time Reviewed:  8:34 AM   Progress in Treatment: Attending groups:   Yes   Participating in groups:  Yes Taking medication as prescribed:  Yes Tolerating medication:  Yes Family/Significant othe contact made: Contact to be made with family Patient understands diagnosis:  Yes Discussing patient identified problems/goals with staff: Yes Medical problems stabilized or resolved: Yes Denies suicidal/homicidal ideation:Yes Issues/concerns per patient self-inventory:  Other:   New problem(s) identified:  Reason for Continuation of Hospitalization: Anxiety Depression Medication stabilization  Interventions implemented related to continuation of hospitalization:  Medication Management; safety checks q 15 mins  Additional comments:  Estimated length of stay:  2-3 days  Discharge Plan:  Home with outpatient follow up  New goal(s):  Review of initial/current patient goals per problem list:    1.  Goal(s): Eliminate SI/other thoughts of self harm (Patient will no longer endorse SI/HI or thoughts of self harm)   Met:  Yes  Target date: d/c  As evidenced by: Patient is not endorsing SI/HI or other thoughts of self harm.    2.  Goal (s):Reduce depression/anxiety (Paitent will rate symptoms at four or below)  Met: Yes  Target date: d/c  As evidenced by: Patient rates symptoms at five and two    3.  Goal(s):.stabilize on meds (Patient will report being stabilized on medications   Met:  No  Target date: d/c  As evidenced by: Patient continues to endorse symptoms    4.  Goal(s): Refer for outpatient follow up (Follow up appointment will be scheduled)   Met:  No  Target date: d/c  As evidenced by: Follow up appointment scheduled    Attendees: Patient:   06/26/2012 8:34 AM  Physican:  Elvin So, MD 06/26/2012 8:34 AM  Nursing:  Eduard Roux, RN 06/26/2012 8:34 AM   Nursing:   Esau Grew,  RN 06/26/2012 8:34 AM   Clinical Social Worker:  Joette Catching, LCSW 06/26/2012 8:34 AM   Other: Mickie Bail, PHM-NP 06/26/2012 8:34 AM   Other:   06/26/2012 8:34 AM Other:        06/26/2012 8:34 AM

## 2012-06-26 NOTE — Progress Notes (Signed)
Patient admitted c/o increased depression, anxiety and suicide attempt last week. Patient reported that last week he drank 5 bottles of wine and took some xanax pills hoping to not wake up. Patient reports that he and his girlfriend of 3 years have been having problems and he found out that she has been dating other men. Patient reports that he has been calling and texting her and she has not responded. Patient was tearful off and on during admission talking about his girlfriend/ex-girlfriend. Patient reports that he is also having financial difficulties with his vehicle possibly being repossessed and eviction from his home. Patient reports that he saw his doctor on today and requested to be started on Abilify because he feels that his depression medication is no longer working. Patent currently denies si/hi/a/v hallucinations. Patient given meal, oriented to unit. Safety maintained on unit with 15 min checks.

## 2012-06-26 NOTE — Progress Notes (Signed)
Nutrition Brief Note  Patient identified on the Malnutrition Screening Tool (MST) Report  Body mass index is 43.23 kg/(m^2). Patient meets criteria for class III morbid obesity based on current BMI.   Pt reports 85 pound intentional weight loss since June 2013 from eating healthier and exercising more. Pt reports eating 6 small meals/day at home with great appetite. Pt reports he has 1 "cheat" day a week however overall eats healthy.   No nutrition interventions warranted at this time. If nutrition issues arise, please consult RD.   Mikey College MS, [place], [place] Pager [phone] After Hours Pager
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
## 2012-06-26 NOTE — Progress Notes (Signed)
Adult Psychoeducational Group Note  Date:  06/26/2012 Time:  1:55 PM  Group Topic/Focus:  Personal Choices and Values:   The focus of this group is to help patients assess and explore the importance of values in their lives, how their values affect their decisions, how they express their values and what opposes their expression.  Participation Level:  Minimal  Participation Quality:  Appropriate and Attentive  Affect:  Flat and Labile  Cognitive:  Appropriate  Insight: Limited  Engagement in Group:  Limited  Modes of Intervention:  Discussion, Education, Problem-solving and Support  Additional Comments:  Travis [name] attended group and shared when asked to during the group. Patient did complete workbook on identifying values and choosing a value oriented life. He affect was flat, even thought he was engaged he did not share.  Travis [name] 06/26/2012, 1:55 PM

## 2012-06-26 NOTE — Telephone Encounter (Signed)
Patient called requesting refill on oxycodone.  Offered him earlier appointment but he cannot come in he is out of town.  He will be out tomorrow and will have his brother pick up the script and mail it to him. Please advise.

## 2012-06-26 NOTE — Progress Notes (Signed)
Patient ID: Travis [name], male   DOB: August 10, 1950, 62 y.o.   MRN: [id number] D: Patient mood and affect is depressed. Pt denies SI/HI/AVH.  Pt c/o of generalized pain. Pt attended evening wrap up group and Interacted appropriately with peers. Pt agitated about evening medications because he is not on all his home medications. Cooperative with assessment. No acute distressed noted at this time.   A: Met with pt 1:1. Medications administered as prescribed. Writer encouraged pt to discuss feelings. Pt encouraged to come to staff with any question or concerns.  R: Patient remains safe. He is complaint with medications and group programming. Continue current POC.

## 2012-06-26 NOTE — Progress Notes (Signed)
[facility] LCSW Group Therapy  06/26/2012 2:56 PM  Type of Therapy:  Group Therapy  Participation Level:  None  Participation Quality:  Drowsy  Affect:  Depressed  Cognitive:  Appropriate  Insight:  None  Engagement in Therapy:  None  Modes of Intervention:  Discussion, Exploration, Problem-solving, Rapport Building and Support  Summary of Progress/Problems:  Patient remained in group but did not participate.  Concha Pyo 06/26/2012, 2:56 PM

## 2012-06-26 NOTE — Progress Notes (Signed)
Patient ID: Travis [name], male   DOB: April 15, 1951, 62 y.o.   MRN: [id number] D: Patient anxious and stating "I can't sleep without my Ambien". Patriciaann Clan, PA notified of request. Pt also states "I gotta get Leretha Dykes here because if I don't pay my bills I will lose my house and car". A: Monitor pt Q 15 minutes for safety, encourage staff/peer interaction and group participation. Administer medications as ordered by MD. R: No distress noted; pt denies SI at this time.
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
## 2012-06-26 NOTE — Progress Notes (Signed)
[facility] Group Notes:  (Nursing/MHT/Case Management/Adjunct)  Type of Therapy:  Psychoeducational Skills  Participation Level:  Minimal  Participation Quality:  Attentive and gaurded  Affect:  Depressed  Cognitive:  Alert, Appropriate and Oriented  Insight:  Good  Engagement in Group:  Engaged  Modes of Intervention:  Activity, Discussion, Education, Problem-solving, Rapport Building, Socialization and Support  Summary of Progress/Problems: Travis [name] attended psychoeducational group on labels. 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 Travis [name] was quiet but spoke when called on while group discussed what labels are, how we use them, how they affect the way we think about and perceive the world, and listed positive and negative labels they have used or been called. Travis [name] stated that a positive label he likes to be defined by is compassionate. Travis [name] was given homework assignment to list 10 words he has been labeled and to find the reality of the situation/label.    Travis [name] 06/26/2012 12:41 PM

## 2012-06-26 NOTE — Progress Notes (Signed)
D: Patient appropriate and cooperative with staff and peers. Patient's affect/mood is flat, sad and depressed. At times, the patient brightens on approach. Declined self inventory sheet today. Patient is attending groups.  A: Support and encouragement provided to patient. Administered scheduled medications per ordering MD. Maintain Q15 minute checks for safety.  R: Patient receptive. Denies SI/HI/AVH. Patient remains safe on the unit.

## 2012-06-26 NOTE — ED Provider Notes (Signed)
Medical screening examination/treatment/procedure(s) were performed by non-physician practitioner and as supervising physician I was immediately available for consultation/collaboration.  Orpah Greek, MD 06/26/12 [phone]

## 2012-06-26 NOTE — Progress Notes (Signed)
[facility] LCSW Aftercare Discharge Planning Group Note  06/26/2012 12:35 PM  Participation Quality:  Appropriate and Attentive  Affect:  Appropriate, Depressed, Tearful  Cognitive:  Alert and Appropriate  Insight:  Engaged  Engagement in Group:  Engaged  Modes of Intervention:  Exploration, Problem-solving and Support  Summary of Progress/Problems:  Patient advised of a suicide attempt two weeks ago by drinking five bottles of wine and taking Xanax.  He currently denies SI/Hi but endorses depression at five.  Patient shared he normally drinks a glass of wine on occasion and has not drink since attempting to OD.  He reports he is followed by his PCP for medications.  He is agreeable to referred for medication management.  Patient reports having home, and transportation.    Concha Pyo 06/26/2012, 12:35 PM

## 2012-06-27 DIAGNOSIS — J329 Chronic sinusitis, unspecified: Secondary | ICD-10-CM

## 2012-06-27 NOTE — Progress Notes (Signed)
D: Patient appropriate and cooperative with staff and peers. Patient's affect/mood is flat and depressed, but as the day progresses, the patient seems to be much brighter as opposed to previous shift. Patient presents to be very excited about d/c tomorrow. He verbalized that after discharge he plans to go to some of the meetings at the [facility]. Patient reported on the self inventory sheet that his appetite is good, energy level is normal and ability to pay attention is improving. He rated depression and feelings of hopelessness "3".  A: Support and encouragement provided to patient. Scheduled medications administered per MD orders. Monitor Q15 minute checks for safety.  R: Patient receptive. Denies SI/HI/AVH. Patient remains safe.

## 2012-06-27 NOTE — Progress Notes (Signed)
Patient ID: Travis [name], male   DOB: 06/28/1950, 62 y.o.   MRN: [id number] D:  Pt presented with depressed mood and flat affect. Pt is excited about  possible discharge tomorrow. Pt is visible in the milieu playing card with peers in the dayroom. Pt attended evening karaoke and Interacted appropriately with peers. Pt denies SI/HI/AVH and pain. Cooperative with assessment. No acute distressed noted at this time.   A: Met with pt 1:1. Medications administered as prescribed. Writer encouraged pt to discuss feelings. Pt encouraged to come to staff with any question or concerns.  R: Patient remains safe. He is complaint with medications and group programming. Continue current POC.

## 2012-06-27 NOTE — Progress Notes (Signed)
[facility] MD Progress Note  06/27/2012 1:19 PM Travis [name]  MRN:  [id number] Subjective:  Patient tolerating increase in Prozac. Mood improved. Looking forward to discharge.  Diagnosis:   Axis I: Depressive Disorder NOS Axis II: Deferred Axis III:  Past Medical History  Diagnosis Date  . Impaired glucose tolerance 09/27/2010  . HYPERLIPIDEMIA-MIXED 07/15/2009  . DEPRESSION 06/24/2009  . Chronic pain syndrome 10/27/2009  . HYPERTENSION 06/24/2009  . CORONARY ARTERY DISEASE 06/24/2009    s/p multiple PCIs  . URETHRAL STRICTURE 06/24/2009  . DEGENERATIVE JOINT DISEASE 06/24/2009  . SHOULDER PAIN, BILATERAL 06/24/2009  . ANKLE PAIN, RIGHT 06/24/2009  . [place] DISEASE, LUMBAR 04/19/2010  . SCIATICA, LEFT 04/19/2010  . FATIGUE 06/24/2009  . NEPHROLITHIASIS, HX OF 06/24/2009  . ERECTILE DYSFUNCTION, ORGANIC 05/30/2010  . OSA (obstructive sleep apnea)     not using CPAP very often  . Obesity   . Allergy to clopidogrel   . ALLERGIC RHINITIS 06/24/2009  . Anxiety 11/12/2011  . Anxiety   . Depression    Axis IV: other psychosocial or environmental problems Axis V: 51-60 moderate symptoms  ADL's:  Intact  Sleep: Fair  Appetite:  Fair   Psychiatric Specialty Exam: Review of Systems  Constitutional: Negative.   HENT: Negative.   Eyes: Negative.   Respiratory: Negative.   Cardiovascular: Negative.   Gastrointestinal: Negative.   Genitourinary: Negative.   Musculoskeletal: Positive for joint pain.  Skin: Negative.   Neurological: Negative.   Endo/Heme/Allergies: Negative.   Psychiatric/Behavioral: Positive for depression. The patient is nervous/anxious.     Blood pressure 114/77, pulse 61, temperature 97.8 F (36.6 C), temperature source Oral, resp. rate 18, height 5' 10.08" (1.78 m), weight 136.986 kg (302 lb).Body mass index is 43.23 kg/(m^2).  General Appearance: Casual  Eye Contact::  Fair  Speech:  Clear and Coherent  Volume:  Normal  Mood:  Anxious and Depressed  Affect:  Congruent   Thought Process:  Coherent  Orientation:  Full (Time, Place, and Person)  Thought Content:  WDL  Suicidal Thoughts:  No  Homicidal Thoughts:  No  Memory:  Immediate;   Fair Recent;   Fair Remote;   Fair  Judgement:  Fair  Insight:  Present  Psychomotor Activity:  Normal  Concentration:  Fair  Recall:  Fair  Akathisia:  No  Handed:  Right  AIMS (if indicated):     Assets:  Communication Skills Desire for Improvement Housing Social Support  Sleep:  Number of Hours: 6    Current Medications: Current Facility-Administered Medications  Medication Dose Route Frequency Provider Last Rate Last Dose  . acetaminophen (TYLENOL) tablet 650 mg  650 mg Oral Q6H PRN Nena Polio, PA-C      . alum & mag hydroxide-simeth (MAALOX/MYLANTA) 200-200-20 MG/5ML suspension 30 mL  30 mL Oral Q4H PRN Nena Polio, PA-C      . amLODipine (NORVASC) tablet 5 mg  5 mg Oral Daily Laverle Hobby, PA   5 mg at 06/27/12 0756  . aspirin chewable tablet 81 mg  81 mg Oral Daily Laverle Hobby, PA   81 mg at 06/27/12 0758  . atenolol (TENORMIN) tablet 25 mg  25 mg Oral Daily Laverle Hobby, PA      . atorvastatin (LIPITOR) tablet 20 mg  20 mg Oral q1800 Laverle Hobby, PA   20 mg at 06/26/12 1722  . azithromycin (ZITHROMAX) tablet 250 mg  250 mg Oral Daily Laverle Hobby, PA   250 mg at 06/27/12  0759  . benazepril (LOTENSIN) tablet 5 mg  5 mg Oral Daily Laverle Hobby, PA      . buPROPion (WELLBUTRIN XL) 24 hr tablet 300 mg  300 mg Oral Daily Laverle Hobby, PA   300 mg at 06/27/12 0757  . clonazePAM (KLONOPIN) tablet 1 mg  1 mg Oral BID Laverle Hobby, PA   1 mg at 06/27/12 0758  . FLUoxetine (PROZAC) capsule 40 mg  40 mg Oral Daily Klani Caridi, MD   40 mg at 06/27/12 0757  . fluticasone (FLONASE) 50 MCG/ACT nasal spray 2 spray  2 spray Each Nare Daily Laverle Hobby, PA   2 spray at 06/27/12 0802  . gabapentin (NEURONTIN) capsule 400 mg  400 mg Oral BID Laverle Hobby, PA   400 mg at 06/27/12 0756   . indomethacin (INDOCIN) capsule 25 mg  25 mg Oral QID Laverle Hobby, PA   25 mg at 06/27/12 1203  . isosorbide mononitrate (IMDUR) 24 hr tablet 30 mg  30 mg Oral Daily Laverle Hobby, PA      . magnesium hydroxide (MILK OF MAGNESIA) suspension 30 mL  30 mL Oral Daily PRN Nena Polio, PA-C      . nitroGLYCERIN (NITROSTAT) SL tablet 0.4 mg  0.4 mg Sublingual Q5 min PRN Laverle Hobby, PA      . oxyCODONE (Oxy IR/ROXICODONE) immediate release tablet 15 mg  15 mg Oral TID Waylan Boga, NP   15 mg at 06/27/12 1300  . prasugrel (EFFIENT) tablet 10 mg  10 mg Oral Daily Laverle Hobby, PA   10 mg at 06/27/12 0759  . traMADol (ULTRAM) tablet 50 mg  50 mg Oral Q6H PRN Laverle Hobby, PA   50 mg at 06/26/12 1100  . traZODone (DESYREL) tablet 50 mg  50 mg Oral QHS,MR X 1 Laverle Hobby, PA   50 mg at 06/26/12 2358    Lab Results:  Results for orders placed during the hospital encounter of 06/25/12 (from the past 48 hour(s))  CBC WITH DIFFERENTIAL     Status: Normal   Collection Time   06/25/12  3:05 PM      Component Value Range Comment   WBC 5.0  4.0 - 10.5 K/uL    RBC 4.49  4.22 - 5.81 MIL/uL    Hemoglobin 13.7  13.0 - 17.0 g/dL    HCT 40.0  39.0 - 52.0 %    MCV 89.1  78.0 - 100.0 fL    MCH 30.5  26.0 - 34.0 pg    MCHC 34.3  30.0 - 36.0 g/dL    RDW 13.3  11.5 - 15.5 %    Platelets 273  150 - 400 K/uL    Neutrophils Relative 59  43 - 77 %    Neutro Abs 2.9  1.7 - 7.7 K/uL    Lymphocytes Relative 30  12 - 46 %    Lymphs Abs 1.5  0.7 - 4.0 K/uL    Monocytes Relative 8  3 - 12 %    Monocytes Absolute 0.4  0.1 - 1.0 K/uL    Eosinophils Relative 2  0 - 5 %    Eosinophils Absolute 0.1  0.0 - 0.7 K/uL    Basophils Relative 1  0 - 1 %    Basophils Absolute 0.0  0.0 - 0.1 K/uL   ETHANOL     Status: Normal   Collection Time   06/25/12  3:05 PM  Component Value Range Comment   Alcohol, Ethyl (B) <11  0 - 11 mg/dL   COMPREHENSIVE METABOLIC PANEL     Status: Abnormal   Collection Time    06/25/12  3:05 PM      Component Value Range Comment   Sodium 140  135 - 145 mEq/L    Potassium 4.2  3.5 - 5.1 mEq/L    Chloride 106  96 - 112 mEq/L    CO2 20  19 - 32 mEq/L    Glucose, Bld 131 (*) 70 - 99 mg/dL    BUN 22  6 - 23 mg/dL    Creatinine, Ser 1.18  0.50 - 1.35 mg/dL    Calcium 9.2  8.4 - 10.5 mg/dL    Total Protein 7.5  6.0 - 8.3 g/dL    Albumin 4.0  3.5 - 5.2 g/dL    AST 22  0 - 37 U/L    ALT 17  0 - 53 U/L    Alkaline Phosphatase 62  39 - 117 U/L    Total Bilirubin 0.5  0.3 - 1.2 mg/dL    GFR calc non Af Amer 65 (*) >90 mL/min    GFR calc Af Amer 76 (*) >90 mL/min   URINALYSIS, ROUTINE W REFLEX MICROSCOPIC     Status: Abnormal   Collection Time   06/25/12  3:11 PM      Component Value Range Comment   Color, Urine YELLOW  YELLOW    APPearance CLOUDY (*) CLEAR    Specific Gravity, Urine 1.020  1.005 - 1.030    pH 5.5  5.0 - 8.0    Glucose, UA NEGATIVE  NEGATIVE mg/dL    Hgb urine dipstick NEGATIVE  NEGATIVE    Bilirubin Urine NEGATIVE  NEGATIVE    Ketones, ur NEGATIVE  NEGATIVE mg/dL    Protein, ur NEGATIVE  NEGATIVE mg/dL    Urobilinogen, UA 0.2  0.0 - 1.0 mg/dL    Nitrite NEGATIVE  NEGATIVE    Leukocytes, UA MODERATE (*) NEGATIVE   URINE RAPID DRUG SCREEN (HOSP PERFORMED)     Status: Normal   Collection Time   06/25/12  3:11 PM      Component Value Range Comment   Opiates NONE DETECTED  NONE DETECTED    Cocaine NONE DETECTED  NONE DETECTED    Benzodiazepines NONE DETECTED  NONE DETECTED    Amphetamines NONE DETECTED  NONE DETECTED    Tetrahydrocannabinol NONE DETECTED  NONE DETECTED    Barbiturates NONE DETECTED  NONE DETECTED   URINE MICROSCOPIC-ADD ON     Status: Abnormal   Collection Time   06/25/12  3:11 PM      Component Value Range Comment   Squamous Epithelial / LPF FEW (*) RARE    WBC, UA 3-6  <3 WBC/hpf    Bacteria, UA FEW (*) RARE    Urine-Other MUCOUS PRESENT     URINE CULTURE     Status: Normal   Collection Time   06/25/12  3:11 PM      Component  Value Range Comment   Specimen Description URINE, CLEAN CATCH      Special Requests NONE      Culture  Setup Time 06/26/2012 01:32      Colony Count 20,OOO COLONIES/ML      Culture        Value: Multiple bacterial morphotypes present, none predominant. Suggest appropriate recollection if clinically indicated.   Report Status 06/26/2012 FINAL  Physical Findings: AIMS: Facial and Oral Movements Muscles of Facial Expression: None, normal Lips and Perioral Area: None, normal Jaw: None, normal Tongue: None, normal,Extremity Movements Upper (arms, wrists, hands, fingers): None, normal Lower (legs, knees, ankles, toes): None, normal, Trunk Movements Neck, shoulders, hips: None, normal, Overall Severity Severity of abnormal movements (highest score from questions above): None, normal Incapacitation due to abnormal movements: None, normal Patient's awareness of abnormal movements (rate only patient's report): No Awareness, Dental Status Current problems with teeth and/or dentures?: No Does patient usually wear dentures?: No  CIWA:    COWS:     Treatment Plan Summary: Daily contact with patient to assess and evaluate symptoms and progress in treatment Medication management  Plan: Continue current plan of care. Plan for discharge for tomorrow.  Medical Decision Making Problem Points:  Established problem, stable/improving (1), Review of last therapy session (1) and Review of psycho-social stressors (1) Data Points:  Review of medication regiment & side effects (2)  I certify that inpatient services furnished can reasonably be expected to improve the patient's condition.   Korrin Waterfield 06/27/2012, 1:19 PM

## 2012-06-27 NOTE — Progress Notes (Signed)
[place] LCSW Group Therapy  [facility]  06/27/2012 3:37 PM  Type of Therapy:  Group Therapy  Participation Level:  None  Participation Quality:  Drowsy  Affect:  Depressed  Cognitive:  Appropriate  Insight:  None  Engagement in Therapy:  None  Modes of Intervention:  Discussion, Exploration, Problem-solving, Rapport Building and Support  Summary of Progress/Problems:  .Patient listened attentively to speaker from [place].  He advised speaker he plans to stop by the office following discharge tomorrow to become involved in their services.   Concha Pyo 06/27/2012, 3:37 PM

## 2012-06-27 NOTE — Progress Notes (Signed)
Adult Psychoeducational Group Note  Date:  06/27/2012 Time:  3:52 PM  Group Topic/Focus:  Rediscovering Joy:   The focus of this group is to explore various ways to relieve stress in a positive manner.  Participation Level:  Active  Participation Quality:  Appropriate, Attentive and Sharing  Affect:  Appropriate  Cognitive:  Appropriate  Insight: Appropriate and Good  Engagement in Group:  Engaged  Modes of Intervention:  Activity, Discussion and Socialization  Additional Comments:  Travis [name] attended group and participated. Group discussion consisted of talking about how to rediscover joy. Patient stated ways that laughter and humor affect the human mind and body. Patient then expressed ways to have humor and laughter apart of their lives. Activity was apart of group, patient explained one thing enjoyable related to the five senses and how to incorporate into daily coping skills.      [name] [name] 06/27/2012, 3:52 PM

## 2012-06-27 NOTE — Progress Notes (Signed)
[facility] Group Notes:  (Nursing/MHT/Case Management/Adjunct)  Date:  06/27/2012  Time:  1:07 AM  Type of Therapy:  wrap up group  Participation Level:  Minimal  Participation Quality:  Appropriate  Affect:  Depressed  Cognitive:  Alert  Insight:  Good  Engagement in Group:  Limited  Modes of Intervention:  Discussion and Support  Summary of Progress/Problems: Pt was limited in discussing how his day was, just revealed that it was okay but would not go into any details about why it was just okay.  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  Travis [name] Coursey 06/27/2012, 1:07 AM
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
## 2012-06-27 NOTE — Progress Notes (Signed)
[facility] LCSW Aftercare Discharge Planning Group Note  06/27/2012 3:35 PM  Participation Quality:  Appropriate and Attentive  Affect:  Appropriate,   Cognitive:  Alert and Appropriate  Insight:  Engaged  Engagement in Group:  Engaged  Modes of Intervention:  Exploration, Problem-solving and Support  Summary of Progress/Problems:  Patient reports being "wonderful" today and denies SI/HI.  He rates depression at four and anxiety at two.  He is hopeful to discharge home soon.  Patient will need a referral for outpatient follow up and is okay with either [facility] outpatient clinic or [facility] Concha Pyo 06/27/2012, 3:35 PM

## 2012-06-28 MED ORDER — ATORVASTATIN CALCIUM 20 MG PO TABS: 20.0000 mg | ORAL_TABLET | Freq: Every day | ORAL | Status: DC

## 2012-06-28 MED ORDER — AMLODIPINE BESYLATE 5 MG PO TABS: 5.0000 mg | ORAL_TABLET | Freq: Every day | ORAL | Status: DC

## 2012-06-28 MED ORDER — ISOSORBIDE MONONITRATE ER 60 MG PO TB24: 60.0000 mg | ORAL_TABLET | Freq: Every day | ORAL | Status: DC

## 2012-06-28 MED ORDER — CLONAZEPAM 1 MG PO TABS: 1.0000 mg | ORAL_TABLET | Freq: Two times a day (BID) | ORAL | Status: DC

## 2012-06-28 MED ORDER — OXYCODONE HCL 15 MG PO TABS: 15.0000 mg | ORAL_TABLET | Freq: Three times a day (TID) | ORAL | Status: DC

## 2012-06-28 MED ORDER — FLUTICASONE PROPIONATE 50 MCG/ACT NA SUSP: 1.0000 | Freq: Every day | NASAL | Status: DC

## 2012-06-28 MED ORDER — TRAZODONE HCL 50 MG PO TABS: 50.0000 mg | ORAL_TABLET | Freq: Every evening | ORAL | Status: DC | PRN

## 2012-06-28 MED ORDER — ATENOLOL 25 MG PO TABS: 25.0000 mg | ORAL_TABLET | Freq: Every day | ORAL | Status: DC

## 2012-06-28 MED ORDER — FLUOXETINE HCL 40 MG PO CAPS: 40.0000 mg | ORAL_CAPSULE | Freq: Every day | ORAL | Status: DC

## 2012-06-28 MED ORDER — DICLOFENAC SODIUM 1 % TD GEL: 2.0000 g | Freq: Three times a day (TID) | TRANSDERMAL | Status: DC

## 2012-06-28 MED ORDER — ASPIRIN 81 MG PO TABS: 81.0000 mg | ORAL_TABLET | Freq: Every day | ORAL | Status: DC

## 2012-06-28 MED ORDER — GABAPENTIN 400 MG PO CAPS: 400.0000 mg | ORAL_CAPSULE | Freq: Two times a day (BID) | ORAL | Status: DC

## 2012-06-28 MED ORDER — PRASUGREL HCL 10 MG PO TABS: 10.0000 mg | ORAL_TABLET | Freq: Every day | ORAL | Status: DC

## 2012-06-28 MED ORDER — TRAMADOL HCL 50 MG PO TABS: 50.0000 mg | ORAL_TABLET | Freq: Four times a day (QID) | ORAL | Status: DC | PRN

## 2012-06-28 MED ORDER — BENAZEPRIL HCL 20 MG PO TABS: 20.0000 mg | ORAL_TABLET | Freq: Every day | ORAL | Status: DC

## 2012-06-28 MED ORDER — BUPROPION HCL ER (XL) 300 MG PO TB24: 300.0000 mg | ORAL_TABLET | Freq: Every day | ORAL | Status: DC

## 2012-06-28 MED ORDER — ISOSORBIDE MONONITRATE ER 60 MG PO TB24: 90.0000 mg | ORAL_TABLET | Freq: Every day | ORAL | Status: DC

## 2012-06-28 MED ORDER — INDOMETHACIN 25 MG PO CAPS: 25.0000 mg | ORAL_CAPSULE | Freq: Four times a day (QID) | ORAL | Status: DC

## 2012-06-28 NOTE — Discharge Summary (Signed)
Reviewed

## 2012-06-28 NOTE — BHH Suicide Risk Assessment (Signed)
Suicide Risk Assessment  Discharge Assessment     Demographic Factors:  Male, Caucasian and Living alone  Mental Status Per Nursing Assessment::   On Admission:  Suicide plan;Self-harm thoughts  Current Mental Status by Physician: Alert and oriented to 4. Denies aH/VH/SI/HI.  Loss Factors: Loss of significant relationship  Historical Factors: Impulsivity  Risk Reduction Factors:   Employed, Positive social support and Positive coping skills or problem solving skills  Continued Clinical Symptoms:  Depression:   Recent sense of peace/wellbeing  Cognitive Features That Contribute To Risk:  Cognitively intact   Suicide Risk:  Minimal: No identifiable suicidal ideation.  Patients presenting with no risk factors but with morbid ruminations; may be classified as minimal risk based on the severity of the depressive symptoms  Discharge Diagnoses:   AXIS I:  Major Depression, Recurrent severe AXIS II:  Deferred AXIS III:   Past Medical History  Diagnosis Date  . Impaired glucose tolerance 09/27/2010  . HYPERLIPIDEMIA-MIXED 07/15/2009  . DEPRESSION 06/24/2009  . Chronic pain syndrome 10/27/2009  . HYPERTENSION 06/24/2009  . CORONARY ARTERY DISEASE 06/24/2009    s/p multiple PCIs  . URETHRAL STRICTURE 06/24/2009  . DEGENERATIVE JOINT DISEASE 06/24/2009  . SHOULDER PAIN, BILATERAL 06/24/2009  . ANKLE PAIN, RIGHT 06/24/2009  . [place] DISEASE, LUMBAR 04/19/2010  . SCIATICA, LEFT 04/19/2010  . FATIGUE 06/24/2009  . NEPHROLITHIASIS, HX OF 06/24/2009  . ERECTILE DYSFUNCTION, ORGANIC 05/30/2010  . OSA (obstructive sleep apnea)     not using CPAP very often  . Obesity   . Allergy to clopidogrel   . ALLERGIC RHINITIS 06/24/2009  . Anxiety 11/12/2011  . Anxiety   . Depression    AXIS IV:  other psychosocial or environmental problems AXIS V:  61-70 mild symptoms  Plan Of Care/Follow-up recommendations:  Activity:  Regular Diet:  Regular Follow up with outpatient appointments.  Is patient on  multiple antipsychotic therapies at discharge:  No   Has Patient had three or more failed trials of antipsychotic monotherapy by history:  No  Recommended Plan for Multiple Antipsychotic Therapies: NA  Bernardo Brayman 06/28/2012, 10:37 AM

## 2012-06-28 NOTE — Tx Team (Signed)
Interdisciplinary Treatment Plan Update (Adult)  Date:  06/28/2012  Time Reviewed:  9:52 AM   Progress in Treatment: Attending groups:   Yes   Participating in groups:  Yes Taking medication as prescribed:  Yes Tolerating medication:  Yes Family/Significant othe contact made: Contact to be made with family Patient understands diagnosis:  Yes Discussing patient identified problems/goals with staff: Yes Medical problems stabilized or resolved: Yes Denies suicidal/homicidal ideation:Yes Issues/concerns per patient self-inventory:  Other:   New problem(s) identified:  Reason for Continuation of Hospitalization: Anxiety Depression Medication stabilization  Interventions implemented related to continuation of hospitalization:  Medication Management; safety checks q 15 mins  Additional comments:  Estimated length of stay:  2-3 days  Discharge Plan:  Home with outpatient follow up  New goal(s):  Review of initial/current patient goals per problem list:    1.  Goal(s): Eliminate SI/other thoughts of self harm (Patient will no longer endorse SI/HI or thoughts of self harm)   Met:  Yes  Target date: d/c  As evidenced by: Patient is not endorsing SI/HI or other thoughts of self harm.    2.  Goal (s):Reduce depression/anxiety (Paitent will rate symptoms at four or below)  Met: Yes  Target date: d/c  As evidenced by: Patient rates symptoms at five and two    3.  Goal(s):.stabilize on meds (Patient will report being stabilized on medications   Met:  No  Target date: d/c  As evidenced by: Patient continues to endorse symptoms    4.  Goal(s): Refer for outpatient follow up (Follow up appointment will be scheduled)   Met:  No  Target date: d/c  As evidenced by: Follow up appointment scheduled    Attendees: Patient:   06/28/2012 9:52 AM  Physican:  Elvin So, MD 06/28/2012 9:52 AM  Nursing:  Eduard Roux, RN 06/28/2012 9:52 AM   Nursing:   Esau Grew,  RN 06/28/2012 9:52 AM   Clinical Social Worker:  Joette Catching, LCSW 06/28/2012 9:52 AM   Other: Mickie Bail, PHM-NP 06/28/2012 9:52 AM   Other:   06/28/2012 9:52 AM Other:        06/28/2012 9:52 AM

## 2012-06-28 NOTE — Telephone Encounter (Signed)
Per Santiago Glad we will not fill script due to inconsistency.

## 2012-06-28 NOTE — Progress Notes (Signed)
[facility] Adult Case Management Discharge Plan :  Will you be returning to the same living situation after discharge: Yes,  Patient is returning to his home. At discharge, do you have transportation home?:Yes,  Patient has transportation home Do you have the ability to pay for your medications:Yes,  Patient able to obtain medications  Release of information consent forms completed and in the chart;  Patient's signature needed at discharge.  Patient to Follow up at: Follow-up Information    Follow up with [facility]. On 07/04/2012. (Your are scheduled for MH-IOP on Thursday, Feburary 13, 2014 at 8:45 AM)    Contact information:   [address] [place], [place]   [address]  [phone]         Patient denies SI/HI:   Yes,  Patient is not endorsing SI/HI or thoughts of self harm    Safety Planning and Suicide Prevention discussed:  Yes,  Reviewed during aftercare groups  Concha Pyo 06/28/2012, 12:32 PM

## 2012-06-28 NOTE — Progress Notes (Signed)
[facility] LCSW Aftercare Discharge Planning Group Note  06/28/2012 12:37 PM  Participation Quality:  Appropriate and Attentive  Affect:  Appropriate,   Cognitive:  Alert and Appropriate  Insight:  Engaged  Engagement in Group:  Engaged  Modes of Intervention:  Exploration, Problem-solving and Support  Summary of Progress/Problems:  Patient reports being "wonderful" today and denies SI/HI.  He rates depression at four and anxiety at two.  He looks forward to discharging home today.  Concha Pyo 06/28/2012, 12:37 PM

## 2012-06-28 NOTE — Telephone Encounter (Signed)
I actually said that he has to pick it up, we will not sent out a prescription of narcotics by mail.

## 2012-06-28 NOTE — Progress Notes (Signed)
Discharge Note  D: Patient's mood was appropriate to the circumstance. Patient affect much brighter today, as opposed to other previous shifts. Patient verbalized that he is still planning to attend some of the meetings at the [facility] and that he now knows where to go for help.  A: Support and encouragement provided. Scheduled medications administered per MD orders. Patient was given his scheduled oxycodone prior to discharge; Per Waylan Boga, NP she advised that it was safe to administer this medication after speaking with the patient and addressing that the medication doesn't cause him to be drowsy or sleepy. Discharge instructions/prescriptions given to patient. Returned belongings to patient.  R: Patient receptive. Denies SI/HI/AVH. Patient d/c without incident. Patient verbalized understanding of discharge instructions and prescriptions.

## 2012-06-28 NOTE — Progress Notes (Signed)
Adult Psychoeducational Group Note  Date:  06/28/2012 Time:  12:09 PM  Group Topic/Focus:  Relapse Prevention Planning:   The focus of this group is to define relapse and discuss the need for planning to combat relapse.  Participation Level:  Active  Participation Quality:  Appropriate and Attentive  Affect:  Appropriate  Cognitive:  Alert and Appropriate  Insight: Good  Engagement in Group:  Engaged  Modes of Intervention:  Discussion and Education  Additional Comments:  Pt talked about working on his anxiety in group. Pt is able to recognize warning signs for his disorder and shared in group about coping skills.  Travis [name] T 06/28/2012, 12:09 PM

## 2012-06-28 NOTE — Progress Notes (Signed)
[facility] INPATIENT:  Family/Significant Other Suicide Prevention Education  Suicide Prevention Education:  Education Completed; Delton Stelle, Johnathan Hausen, [phone] has been identified by the patient as the family member/significant other with whom the patient will be residing, and identified as the person(s) who will aid the patient in the event of a mental health crisis (suicidal ideations/suicide attempt).  With written consent from the patient, the family member/significant other has been provided the following suicide prevention education, prior to the and/or following the discharge of the patient.  The suicide prevention education provided includes the following:  Suicide risk factors  Suicide prevention and interventions  National Suicide Hotline telephone number  [facility] assessment telephone number  [facility] Emergency Assistance [place] and/or Residential Mobile Crisis Unit telephone number  Request made of family/significant other to:  Remove weapons (e.g., guns, rifles, knives), all items previously/currently identified as safety concern.  Brother reports patient does not have a gun.  Remove drugs/medications (over-the-counter, prescriptions, illicit drugs), all items previously/currently identified as a safety concern.  The family member/significant other verbalizes understanding of the suicide prevention education information provided.  The family member/significant other agrees to remove the items of safety concern listed above.  Concha Pyo 06/28/2012, 1:11 PM

## 2012-06-28 NOTE — Progress Notes (Signed)
[facility] Group Notes:  (Nursing/MHT/Case Management/Adjunct)  Date:  06/28/2012  Time:  1:17 PM  Type of Therapy:  Therapeutic Activity- Recovery goals  Participation Level:  Active  Participation Quality:  Appropriate and Attentive  Affect:  Appropriate  Cognitive:  Appropriate  Insight:  Appropriate and Good  Engagement in Group:  Engaged and Improving  Modes of Intervention:  Activity, Discussion, Education, Socialization and Support  Summary of Progress/Problems: Charlesattended and participated in therapeutic activity on recovery goals. Patient stated one goal on recovery for to have when discharged, once written another group member would read the goal and the patient would explain why they chose that goal. Patient was guarded when sharing due to the resistant with sharing in a group setting. Travis [name] goal is to learn to be able to live by himself and rely on others for support.    Travis [name] Travis [name] 06/28/2012, 1:17 PM

## 2012-06-28 NOTE — Discharge Summary (Signed)
Physician Discharge Summary Note  Patient:  Travis [name] is an 62 y.o., male MRN:  [id number] DOB:  Oct 12, 1950 Patient phone:  [phone] (home)  Patient address:   [address] [place] [facility] [address] [address],   Date of Admission:  06/25/2012 Date of Discharge: 06/28/2012  Reason for Admission:  Depression with suicide attempt by overdosing  Discharge Diagnoses: Active Problems:  * No active hospital problems. *   Review of Systems  Constitutional: Negative.   HENT: Negative.   Eyes: Negative.   Respiratory: Negative.   Cardiovascular: Negative.   Gastrointestinal: Negative.   Genitourinary: Negative.   Musculoskeletal: Positive for back pain.  Skin: Negative.   Neurological: Negative.   Endo/Heme/Allergies: Negative.   Psychiatric/Behavioral: Positive for depression. The patient is nervous/anxious.    Axis Diagnosis:   AXIS I:  Anxiety Disorder NOS and Depressive Disorder NOS AXIS II:  Deferred AXIS III:   Past Medical History  Diagnosis Date  . Impaired glucose tolerance 09/27/2010  . HYPERLIPIDEMIA-MIXED 07/15/2009  . DEPRESSION 06/24/2009  . Chronic pain syndrome 10/27/2009  . HYPERTENSION 06/24/2009  . CORONARY ARTERY DISEASE 06/24/2009    s/p multiple PCIs  . URETHRAL STRICTURE 06/24/2009  . DEGENERATIVE JOINT DISEASE 06/24/2009  . SHOULDER PAIN, BILATERAL 06/24/2009  . ANKLE PAIN, RIGHT 06/24/2009  . [place] DISEASE, LUMBAR 04/19/2010  . SCIATICA, LEFT 04/19/2010  . FATIGUE 06/24/2009  . NEPHROLITHIASIS, HX OF 06/24/2009  . ERECTILE DYSFUNCTION, ORGANIC 05/30/2010  . OSA (obstructive sleep apnea)     not using CPAP very often  . Obesity   . Allergy to clopidogrel   . ALLERGIC RHINITIS 06/24/2009  . Anxiety 11/12/2011  . Anxiety   . Depression    AXIS IV:  other psychosocial or environmental problems, problems related to social environment and problems with primary support group AXIS V:  61-70 mild symptoms  Level of Care:  OP  Hospital Course:  Reviewed chart, vital signs,  medications, and notes. 1-Admitted for crisis management and stabilization, completed 2-Individual and group therapy attended with participation 3-Medication managed for depression and anxiety to reduce current symptoms to base line and improve the patient's overall level of functioning:  Medications reviewed with the patient and he stated no untoward effects, Prozac and Klonopin increased during his hospitalization with positive results. 4-Coping skills for depression and anxiety developed and utilized 5-Addressed health issues--pain stable, blood pressures fluctuating--he will follow-up with his cardiologist, home medications will resume 6-Treatment plan in place to prevent relapse of depression and anxiety 7-Psychosocial education regarding relapse prevention and self-care 8-Patient denied suicidal/homicidal ideations and auditory/visual hallucinations, follow-up appointments encouraged to attend, outside support groups encouraged and information given, Rx and one-week supply of Prozac given at discharge, patient has his other medications  Consults:  None  Significant Diagnostic Studies:  labs: Completed and reviewed, stable  Discharge Vitals:   Blood pressure 153/76, pulse 69, temperature 97.3 F (36.3 C), temperature source Oral, resp. rate 18, height 5' 10.08" (1.78 m), weight 136.986 kg (302 lb). Body mass index is 43.23 kg/(m^2). Lab Results:   Results for orders placed during the hospital encounter of 06/25/12 (from the past 72 hour(s))  CBC WITH DIFFERENTIAL     Status: Normal   Collection Time   06/25/12  3:05 PM      Component Value Range Comment   WBC 5.0  4.0 - 10.5 K/uL    RBC 4.49  4.22 - 5.81 MIL/uL    Hemoglobin 13.7  13.0 - 17.0 g/dL    HCT 40.0  39.0 - 52.0 %    MCV 89.1  78.0 - 100.0 fL    MCH 30.5  26.0 - 34.0 pg    MCHC 34.3  30.0 - 36.0 g/dL    RDW 13.3  11.5 - 15.5 %    Platelets 273  150 - 400 K/uL    Neutrophils Relative 59  43 - 77 %    Neutro Abs 2.9  1.7  - 7.7 K/uL    Lymphocytes Relative 30  12 - 46 %    Lymphs Abs 1.5  0.7 - 4.0 K/uL    Monocytes Relative 8  3 - 12 %    Monocytes Absolute 0.4  0.1 - 1.0 K/uL    Eosinophils Relative 2  0 - 5 %    Eosinophils Absolute 0.1  0.0 - 0.7 K/uL    Basophils Relative 1  0 - 1 %    Basophils Absolute 0.0  0.0 - 0.1 K/uL   ETHANOL     Status: Normal   Collection Time   06/25/12  3:05 PM      Component Value Range Comment   Alcohol, Ethyl (B) <11  0 - 11 mg/dL   COMPREHENSIVE METABOLIC PANEL     Status: Abnormal   Collection Time   06/25/12  3:05 PM      Component Value Range Comment   Sodium 140  135 - 145 mEq/L    Potassium 4.2  3.5 - 5.1 mEq/L    Chloride 106  96 - 112 mEq/L    CO2 20  19 - 32 mEq/L    Glucose, Bld 131 (*) 70 - 99 mg/dL    BUN 22  6 - 23 mg/dL    Creatinine, Ser 1.18  0.50 - 1.35 mg/dL    Calcium 9.2  8.4 - 10.5 mg/dL    Total Protein 7.5  6.0 - 8.3 g/dL    Albumin 4.0  3.5 - 5.2 g/dL    AST 22  0 - 37 U/L    ALT 17  0 - 53 U/L    Alkaline Phosphatase 62  39 - 117 U/L    Total Bilirubin 0.5  0.3 - 1.2 mg/dL    GFR calc non Af Amer 65 (*) >90 mL/min    GFR calc Af Amer 76 (*) >90 mL/min   URINALYSIS, ROUTINE W REFLEX MICROSCOPIC     Status: Abnormal   Collection Time   06/25/12  3:11 PM      Component Value Range Comment   Color, Urine YELLOW  YELLOW    APPearance CLOUDY (*) CLEAR    Specific Gravity, Urine 1.020  1.005 - 1.030    pH 5.5  5.0 - 8.0    Glucose, UA NEGATIVE  NEGATIVE mg/dL    Hgb urine dipstick NEGATIVE  NEGATIVE    Bilirubin Urine NEGATIVE  NEGATIVE    Ketones, ur NEGATIVE  NEGATIVE mg/dL    Protein, ur NEGATIVE  NEGATIVE mg/dL    Urobilinogen, UA 0.2  0.0 - 1.0 mg/dL    Nitrite NEGATIVE  NEGATIVE    Leukocytes, UA MODERATE (*) NEGATIVE   URINE RAPID DRUG SCREEN (HOSP PERFORMED)     Status: Normal   Collection Time   06/25/12  3:11 PM      Component Value Range Comment   Opiates NONE DETECTED  NONE DETECTED    Cocaine NONE DETECTED  NONE DETECTED     Benzodiazepines NONE DETECTED  NONE DETECTED  Amphetamines NONE DETECTED  NONE DETECTED    Tetrahydrocannabinol NONE DETECTED  NONE DETECTED    Barbiturates NONE DETECTED  NONE DETECTED   URINE MICROSCOPIC-ADD ON     Status: Abnormal   Collection Time   06/25/12  3:11 PM      Component Value Range Comment   Squamous Epithelial / LPF FEW (*) RARE    WBC, UA 3-6  <3 WBC/hpf    Bacteria, UA FEW (*) RARE    Urine-Other MUCOUS PRESENT     URINE CULTURE     Status: Normal   Collection Time   06/25/12  3:11 PM      Component Value Range Comment   Specimen Description URINE, CLEAN CATCH      Special Requests NONE      Culture  Setup Time 06/26/2012 01:32      Colony Count 20,OOO COLONIES/ML      Culture        Value: Multiple bacterial morphotypes present, none predominant. Suggest appropriate recollection if clinically indicated.   Report Status 06/26/2012 FINAL       Physical Findings: AIMS: Facial and Oral Movements Muscles of Facial Expression: None, normal Lips and Perioral Area: None, normal Jaw: None, normal Tongue: None, normal,Extremity Movements Upper (arms, wrists, hands, fingers): None, normal Lower (legs, knees, ankles, toes): None, normal, Trunk Movements Neck, shoulders, hips: None, normal, Overall Severity Severity of abnormal movements (highest score from questions above): None, normal Incapacitation due to abnormal movements: None, normal Patient's awareness of abnormal movements (rate only patient's report): No Awareness, Dental Status Current problems with teeth and/or dentures?: No Does patient usually wear dentures?: No  CIWA:    COWS:     Psychiatric Specialty Exam: See Psychiatric Specialty Exam and Suicide Risk Assessment completed by Attending Physician prior to discharge.  Discharge destination:  Home  Is patient on multiple antipsychotic therapies at discharge:  No   Has Patient had three or more failed trials of antipsychotic monotherapy by  history:  No Recommended Plan for Multiple Antipsychotic Therapies:  N/A  Discharge Orders    Future Appointments: Provider: Department: Dept Phone: Center:   07/01/2012 10:00 AM Aundria Mems, PA-C [facility] [phone] [facility]   11/07/2012 10:45 AM [name], MD [facility] [phone] [facility]     Future Orders Please Complete By Expires   Diet - low sodium heart healthy      Activity as tolerated - No restrictions          Medication List     As of 06/28/2012 10:30 AM    STOP taking these medications         azithromycin 250 MG tablet   Commonly known as: ZITHROMAX      gabapentin 800 MG tablet   Commonly known as: NEURONTIN   Replaced by: gabapentin 400 MG capsule      mirtazapine 30 MG tablet   Commonly known as: REMERON      tiZANidine 4 MG tablet   Commonly known as: ZANAFLEX      zolpidem 10 MG tablet   Commonly known as: AMBIEN      TAKE these medications      Indication    amLODipine 5 MG tablet   Commonly known as: NORVASC   Take 1 tablet (5 mg total) by mouth daily.    Indication: High Blood Pressure      aspirin 81 MG tablet   Take 1 tablet (81  mg total) by mouth daily.    Indication: thrombosis prevention      atenolol 25 MG tablet   Commonly known as: TENORMIN   Take 1 tablet (25 mg total) by mouth daily.    Indication: High Blood Pressure      atorvastatin 20 MG tablet   Commonly known as: LIPITOR   Take 1 tablet (20 mg total) by mouth daily.    Indication: hyperlipidemia      benazepril 20 MG tablet   Commonly known as: LOTENSIN   Take 1 tablet (20 mg total) by mouth daily.    Indication: High Blood Pressure      buPROPion 300 MG 24 hr tablet   Commonly known as: WELLBUTRIN XL   Take 1 tablet (300 mg total) by mouth daily.    Indication: Major Depressive Disorder      clonazePAM 1 MG tablet   Commonly known as: KLONOPIN   Take 1 tablet (1 mg total) by mouth 2 (two)  times daily.    Indication: anxiety      diclofenac sodium 1 % Gel   Commonly known as: VOLTAREN   Apply 2 g topically 3 (three) times daily.    Indication: anti-inflammatory pain, topical      FLUoxetine 40 MG capsule   Commonly known as: PROZAC   Take 1 capsule (40 mg total) by mouth daily.    Indication: Depression      fluticasone 50 MCG/ACT nasal spray   Commonly known as: FLONASE   Place 1 spray into the nose daily.    Indication: Hayfever      gabapentin 400 MG capsule   Commonly known as: NEURONTIN   Take 1 capsule (400 mg total) by mouth 2 (two) times daily.    Indication: Neuropathic Pain      indomethacin 25 MG capsule   Commonly known as: INDOCIN   Take 1 capsule (25 mg total) by mouth 4 (four) times daily.    Indication: arthritis      isosorbide mononitrate 60 MG 24 hr tablet   Commonly known as: IMDUR   Take 1.5 tablets (90 mg total) by mouth daily.    Indication: cardiac issues      nitroGLYCERIN 0.4 MG SL tablet   Commonly known as: NITROSTAT   Place 1 tablet (0.4 mg total) under the tongue every 5 (five) minutes as needed for chest pain.       oxyCODONE 15 MG immediate release tablet   Commonly known as: ROXICODONE   Take 1 tablet (15 mg total) by mouth 3 (three) times daily.    Indication: Moderate to Severe Pain      prasugrel 10 MG Tabs   Commonly known as: EFFIENT   Take 1 tablet (10 mg total) by mouth daily.    Indication: Acute Coronary Syndrome      traMADol 50 MG tablet   Commonly known as: ULTRAM   Take 1 tablet (50 mg total) by mouth every 6 (six) hours as needed.    Indication: Moderate to Moderately Severe Pain      traZODone 50 MG tablet   Commonly known as: DESYREL   Take 1 tablet (50 mg total) by mouth at bedtime and may repeat dose one time if needed.    Indication: Trouble Sleeping           Follow-up Information    Follow up with [facility]. On 06/28/2012.   Contact  information:  [place], [place]   [address]  [phone]         Follow-up recommendations:  Activity as tolerated, low-sodium heart healthy diet  Comments:  Patient will continue his care with [facility] outpatient  Total Discharge Time:  Greater than 30 minutes  Signed: Waylan Boga, PMH-NP 06/28/2012, 10:30 AM

## 2012-07-01 ENCOUNTER — Encounter: Payer: Self-pay | Admitting: Physical Medicine and Rehabilitation

## 2012-07-01 ENCOUNTER — Encounter
Payer: Medicare Other | Attending: Physical Medicine and Rehabilitation | Admitting: Physical Medicine and Rehabilitation

## 2012-07-01 VITALS — BP 140/81 | HR 72 | Resp 14 | Ht 72.0 in | Wt 300.0 lb

## 2012-07-01 DIAGNOSIS — M545 Low back pain, unspecified: Secondary | ICD-10-CM | POA: Insufficient documentation

## 2012-07-01 DIAGNOSIS — M25579 Pain in unspecified ankle and joints of unspecified foot: Secondary | ICD-10-CM | POA: Insufficient documentation

## 2012-07-01 DIAGNOSIS — M5137 Other intervertebral disc degeneration, lumbosacral region: Secondary | ICD-10-CM

## 2012-07-01 DIAGNOSIS — G894 Chronic pain syndrome: Secondary | ICD-10-CM

## 2012-07-01 DIAGNOSIS — M25569 Pain in unspecified knee: Secondary | ICD-10-CM | POA: Insufficient documentation

## 2012-07-01 DIAGNOSIS — M5136 Other intervertebral disc degeneration, lumbar region: Secondary | ICD-10-CM

## 2012-07-01 DIAGNOSIS — G8929 Other chronic pain: Secondary | ICD-10-CM | POA: Insufficient documentation

## 2012-07-01 DIAGNOSIS — M25519 Pain in unspecified shoulder: Secondary | ICD-10-CM | POA: Insufficient documentation

## 2012-07-01 MED ORDER — OXYCODONE HCL 15 MG PO TABS: 15.0000 mg | ORAL_TABLET | ORAL | Status: DC | PRN

## 2012-07-01 NOTE — Patient Instructions (Signed)
Continue with your exercise and healthy life style.

## 2012-07-01 NOTE — Progress Notes (Signed)
Subjective:    Patient ID: Travis [name], male    DOB: 10-29-1950, 62 y.o.   MRN: [id number]  HPI The patient reports that the medication is controlling his pain very well.  The problem has been stable. He reports that he has been working out in the pool every day, he also works out with a Clinical research associate at the [employer], and eats a healthy diet. He also states that he has lost about 80 lbs, since last summer.  He is a little down today, because his girlfriend, of 3 1/2 years, broke up with him.He states, that he went to behavioral health, and was admitted for 3 days, because of severe depression, he states, that he is a little better now, and has no suicidal thoughts.  Pain Inventory Average Pain 6 Pain Right Now 4 My pain is sharp, burning, dull, tingling and aching  In the last 24 hours, has pain interfered with the following? General activity 3 Relation with others 3 Enjoyment of life 3 What TIME of day is your pain at its worst? evening Sleep (in general) Poor  Pain is worse with: walking and standing Pain improves with: pacing activities and medication Relief from Meds: 6  Mobility how many minutes can you walk? 15 Do you have any goals in this area?  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  yes  Neuro/Psych weakness numbness tingling trouble walking spasms depression anxiety loss of taste or smell suicidal thoughts  Prior Studies Any changes since last visit?  no  Physicians involved in your care Any changes since last visit?  no   Family History  Problem Relation Age of Onset  . Depression Mother   . Heart disease Mother   . Hypertension Mother   . Cancer Mother     Breast  . Diabetes Father   . Heart disease Father     CABG  . Cancer Father     prostate and skin cancer  . Hypertension Father   .  Hyperlipidemia Father   . Depression Brother     x 2  . Hypertension Brother     x2  . Coronary artery disease Other   . Hypertension Other   . Depression Other   . Heart disease Maternal Grandfather   . Early death Maternal Grandfather 61    heart attack  . Early death Paternal Grandfather    History   Social History  . Marital Status: Divorced    Spouse Name: N/A    Number of Children: 2  . Years of Education: N/A   Occupational History  . disabled since 2006 due to ortho. heart, psych   . part time work as an Multimedia programmer, wrestling, and baseball Agricultural consultant    Social History Main Topics  . Smoking status: Former Smoker    Types: Cigars    Quit date: 08/28/2010  . Smokeless tobacco: Never Used     Comment: rare  . Alcohol Use: 0.0 oz/week     Comment: occasional  . Drug Use: No  . Sexually Active: Not Currently   Other Topics Concern  . None   Social History Narrative   Played semi-pro football, used steroids   Divorced moved here from [place], [place]         Past Surgical History  Procedure Laterality Date  . Tonsillectomy    .  Rotator cuff repair      bilateral  . S/p knee replacement  2008  . S/p bilat wrist surgury    . S/p urethral dilations,  multi 2009,2010  . Carpal tunnel release    . S/p right knee arthroscopy  03/2005    and multi prior  . S/p umbilical hernia    . S/p left knee arthroscopy  june 2002    multi prior  . S/p right shoulder rotater cuff surgury  april 2011    Dr. [name] Lighter   Past Medical History  Diagnosis Date  . Impaired glucose tolerance 09/27/2010  . HYPERLIPIDEMIA-MIXED 07/15/2009  . DEPRESSION 06/24/2009  . Chronic pain syndrome 10/27/2009  . HYPERTENSION 06/24/2009  . CORONARY ARTERY DISEASE 06/24/2009    s/p multiple PCIs  . URETHRAL STRICTURE 06/24/2009  . DEGENERATIVE JOINT DISEASE 06/24/2009  . SHOULDER PAIN, BILATERAL 06/24/2009  . ANKLE PAIN, RIGHT 06/24/2009  . [place] DISEASE, LUMBAR 04/19/2010  .  SCIATICA, LEFT 04/19/2010  . FATIGUE 06/24/2009  . NEPHROLITHIASIS, HX OF 06/24/2009  . ERECTILE DYSFUNCTION, ORGANIC 05/30/2010  . OSA (obstructive sleep apnea)     not using CPAP very often  . Obesity   . Allergy to clopidogrel   . ALLERGIC RHINITIS 06/24/2009  . Anxiety 11/12/2011  . Anxiety   . Depression    BP 140/81  Pulse 72  Resp 14  Ht 6' (1.829 m)  Wt 300 lb (136.079 kg)  BMI 40.68 kg/m2  SpO2 94%     Review of Systems  Constitutional: Positive for fever and chills.  Musculoskeletal: Positive for back pain and gait problem.  Neurological: Positive for weakness and numbness.  Hematological: Bruises/bleeds easily.  Psychiatric/Behavioral: Positive for suicidal ideas and dysphoric mood. The patient is nervous/anxious.   All other systems reviewed and are negative.       Objective:   Physical Exam Constitutional: He is oriented to person, place, and time. He appears well-developed.  Morbidly obese  HENT:  Head: Normocephalic.  Neck: Neck supple.  Musculoskeletal: He exhibits tenderness.  Right shoulder: He exhibits decreased range of motion, tenderness and pain.  Right knee: tenderness found.  Left knee: tenderness found.  Lumbar back: He exhibits decreased range of motion and tenderness.  Neurological: He is alert and oriented to person, place, and time.  Skin: Skin is warm and dry.  Psychiatric: He has a normal mood and affect.  Symmetric normal motor tone is noted throughout. Normal muscle bulk. Muscle testing reveals 5/5 muscle strength of the upper extremity, and 5/5 of the lower extremity.  DTR in the upper and lower extremity are present and symmetric 2+. No clonus is noted.  Patient arises from chair with mild difficulty. Wide based gait with normal arm swing bilateral.         Assessment & Plan:  This is a 62 year old male with  1.Chronic LBP  2.Right shoulder pain  3.Bilateral knee pain  4. Right ankle pain  Plan :  Continue with medication,  continue with exercising in the pool, also practise some balance exercises in the pool, and continue with working out with a trainer at [employer], also continue to eat healthy and loose weight. He went to behavioral health, and was admitted for 3 days, because of severe depression, because his girlfriend of over 3 years broke up with him 4 month ago. He states, that he is a little better now, and has no suicidal thoughts at this point. I advised him to go  back to [facility] if he gets more depressed again. Follow up in 2 months.

## 2012-07-02 ENCOUNTER — Other Ambulatory Visit: Payer: Self-pay | Admitting: Cardiology

## 2012-07-03 NOTE — Progress Notes (Signed)
Patient Discharge Instructions:  Next Level Care Provider Has Access to the EMR, 07/03/12 Records provided to [facility] via [facility]/Epic access.  Patsey Berthold, 07/03/2012, 11:16 AM

## 2012-07-04 ENCOUNTER — Ambulatory Visit: Payer: Medicare HMO | Admitting: Physical Medicine and Rehabilitation

## 2012-07-08 ENCOUNTER — Ambulatory Visit: Payer: Medicare Other | Admitting: Psychology

## 2012-07-08 ENCOUNTER — Other Ambulatory Visit (HOSPITAL_COMMUNITY): Payer: Medicare Other | Attending: Psychiatry | Admitting: Psychiatry

## 2012-07-08 ENCOUNTER — Encounter (HOSPITAL_COMMUNITY): Payer: Self-pay

## 2012-07-08 DIAGNOSIS — F331 Major depressive disorder, recurrent, moderate: Secondary | ICD-10-CM

## 2012-07-08 DIAGNOSIS — F411 Generalized anxiety disorder: Secondary | ICD-10-CM | POA: Insufficient documentation

## 2012-07-08 DIAGNOSIS — F332 Major depressive disorder, recurrent severe without psychotic features: Secondary | ICD-10-CM | POA: Insufficient documentation

## 2012-07-08 DIAGNOSIS — F329 Major depressive disorder, single episode, unspecified: Secondary | ICD-10-CM

## 2012-07-08 NOTE — Progress Notes (Signed)
    Daily Group Progress Note  Program: IOP  Group Time: 9:00-10:30 am   Participation Level: Minimal  Behavioral Response: Appropriate  Type of Therapy:  Process Group  Summary of Progress: Today was patients first day in the group following discharge from a recent inpatient admission. He appeared extremely anxious and fidgeted during the group but was attentive.      Group Time: 10:30 am - 12:00 pm   Participation Level:  Minimal  Behavioral Response: Appropriate  Type of Therapy: Psycho-education Group  Summary of Progress: patient participated in a group on grief and loss and identified how current losses are impacting depression and effective ways of grieving.   Tresa Res, LCSW
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
## 2012-07-09 ENCOUNTER — Other Ambulatory Visit (HOSPITAL_COMMUNITY): Payer: Medicare Other | Admitting: Psychiatry

## 2012-07-09 DIAGNOSIS — F329 Major depressive disorder, single episode, unspecified: Secondary | ICD-10-CM

## 2012-07-09 MED ORDER — CHLORPROMAZINE HCL 10 MG PO TABS: 10.0000 mg | ORAL_TABLET | Freq: Every day | ORAL | Status: DC

## 2012-07-09 NOTE — Progress Notes (Signed)
Patient ID: Travis [name], male   DOB: 08-01-1950, 62 y.o.   MRN: [id number] A:  Did initial pre-cert on 5-91-02.  Spoke with Doreatha Massed, who auth'd 18 MH-IOP days with auth # [id number].

## 2012-07-09 NOTE — Progress Notes (Signed)
    Daily Group Progress Note  Program: IOP  Group Time: 9:00-10:30 am   Participation Level: Active  Behavioral Response: Appropriate  Type of Therapy:  Process Group  Summary of Progress: Patient reports high depression and was tearful. He said he can't function in his life due to the loss of his recent relationship of his girlfriend. He listed many unhealthy components of the relationship and said he missed the companionship more than her. He fears being alone and described co-dependent traits. He is working on grieving the loss of this relationship and identifying how to make himself healthier.      Group Time: 10:30 am - 12:00 pm   Participation Level:  Active  Behavioral Response: Appropriate  Type of Therapy: Psycho-education Group  Summary of Progress: Patient learned about healthy relationships and the components to making better relationships with others.   Tresa Res, LCSW

## 2012-07-10 ENCOUNTER — Other Ambulatory Visit (HOSPITAL_COMMUNITY): Payer: Medicare Other | Admitting: Psychiatry

## 2012-07-10 DIAGNOSIS — F329 Major depressive disorder, single episode, unspecified: Secondary | ICD-10-CM

## 2012-07-10 NOTE — Progress Notes (Signed)
    Daily Group Progress Note  Program: IOP  Group Time: 9:00-10:30 am   Participation Level: Active  Behavioral Response: Appropriate  Type of Therapy:  Process Group  Summary of Progress: Patient was very talkative today and required some redirection to allow others time to share. He reported feeling "great" at the start of group and then said he "swung into a bad depression" after listening to the relaxation music that reminded him of a wife he had previously who died. He states he is still not over this loss and it seems to severely affect his ability to function when reminded of her. He states the problem with his most recent girlfriend that he was severely depressed about yesterday has resolved itself after patient had lunch with her yesterday and appears to have closure with that situation. Members appeared confused as to the significant changes in moods an stressors with patient.      Group Time: 10:30 am - 12:00 pm   Participation Level:  Active  Behavioral Response: Appropriate  Type of Therapy: Psycho-education Group  Summary of Progress: Patient participated in an educational group on support services offered through the [facility] and learned how to access them to foster continued emotional wellness.   Tresa Res, LCSW

## 2012-07-11 ENCOUNTER — Other Ambulatory Visit (HOSPITAL_COMMUNITY): Payer: Medicare Other | Admitting: Psychiatry

## 2012-07-11 DIAGNOSIS — F329 Major depressive disorder, single episode, unspecified: Secondary | ICD-10-CM

## 2012-07-12 ENCOUNTER — Other Ambulatory Visit (HOSPITAL_COMMUNITY): Payer: Medicare Other | Admitting: Psychiatry

## 2012-07-12 DIAGNOSIS — F329 Major depressive disorder, single episode, unspecified: Secondary | ICD-10-CM

## 2012-07-12 NOTE — Progress Notes (Signed)
    Daily Group Progress Note  Program: IOP  Group Time: 9:00-10:30 am   Participation Level: Active  Behavioral Response: Appropriate  Type of Therapy:  Process Group  Summary of Progress: Patient explained that he is doing well today. He continues to work on anxiety and self-esteem. Patient explained to the group that he feels his depression has lessened and he is feeling better about himself because he has gotten some attention from new women on an online dating site. He also stated that he and his ex-girlfriend are getting along much better and are able to be friends. He is feeling more confident in himself and has a brighter outlook on the future today.      Group Time: 10:30 am - 12:00 pm   Participation Level:  Active  Behavioral Response: Appropriate  Type of Therapy: Psycho-education Group  Summary of Progress: Patient participated in a skills training on managing difficult emotions and how to use mindfulness to sit with painful feelings instead of rejecting them.  Tresa Res, LCSW

## 2012-07-12 NOTE — Progress Notes (Signed)
    Daily Group Progress Note  Program: IOP  Group Time: 9:00-10:30 am   Participation Level: Active  Behavioral Response: Appropriate  Type of Therapy:  Process Group  Summary of Progress: Patient reports high depression today associated with conflicted feelings about the end of his romantic relationship. He stated he is considering trying to date another woman and feels guilty because of the feelings he still has for his ex. Members challenged patient on his trying to move forward being fully grieving the end of his first relationship. Patient is working self-esteem and on co-dependency traits that impact his depression.      Group Time: 10:30 am - 12:00 pm   Participation Level:  Active  Behavioral Response: Appropriate  Type of Therapy: Psycho-education Group  Summary of Progress: Patient participated in a stress management technique called Progressive Muscle Relaxation and learned the benefits of relaxation to manage wellness.  Tresa Res, LCSW
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
## 2012-07-15 ENCOUNTER — Telehealth (HOSPITAL_COMMUNITY): Payer: Self-pay | Admitting: Psychiatry

## 2012-07-15 ENCOUNTER — Other Ambulatory Visit (HOSPITAL_COMMUNITY): Payer: Medicare Other

## 2012-07-16 ENCOUNTER — Other Ambulatory Visit (HOSPITAL_COMMUNITY): Payer: Medicare Other | Admitting: Psychiatry

## 2012-07-16 DIAGNOSIS — F329 Major depressive disorder, single episode, unspecified: Secondary | ICD-10-CM

## 2012-07-16 NOTE — Progress Notes (Signed)
    Daily Group Progress Note  Program: IOP  Group Time: 9:00-10:30 am   Participation Level: Active  Behavioral Response: Appropriate  Type of Therapy:  Process Group  Summary of Progress: Patient reports feeling some depression again today and questioned why he has good and bad days with his mood. Patient was talkative and required regular redirection to allow others time to share. Patient states he is still seeing his ex-girlfriend, as friends only, and has started dating another woman. Members gave patient feedback on how his mood shifts rapidly based on how his relationships are going. Patient appears to struggle to maintain his emotions if he is not in a good place in a romantic relationship. Patient also stated his baseline level of functioning is to be suicidal daily and he has been that way since he was a child and states he does not remember any days without feeling that way but denies wanting to act on them.      Group Time: 10:30 am - 12:00 pm   Participation Level:  Active  Behavioral Response: Appropriate  Type of Therapy: Psycho-education Group  Summary of Progress: Patient learned about the symptoms of depression and how to identify them and act early to prevent relapses.   Tresa Res, LCSW

## 2012-07-17 ENCOUNTER — Telehealth (HOSPITAL_COMMUNITY): Payer: Self-pay | Admitting: Psychiatry

## 2012-07-17 ENCOUNTER — Other Ambulatory Visit (HOSPITAL_COMMUNITY): Payer: Medicare Other | Admitting: Psychiatry

## 2012-07-17 DIAGNOSIS — F329 Major depressive disorder, single episode, unspecified: Secondary | ICD-10-CM

## 2012-07-18 ENCOUNTER — Other Ambulatory Visit (HOSPITAL_COMMUNITY): Payer: Medicare Other | Admitting: Psychiatry

## 2012-07-18 DIAGNOSIS — F329 Major depressive disorder, single episode, unspecified: Secondary | ICD-10-CM

## 2012-07-18 NOTE — Progress Notes (Signed)
    Daily Group Progress Note  Program: IOP  Group Time: 9:00-10:30 am   Participation Level: Active  Behavioral Response: Appropriate  Type of Therapy:  Psycho-education Group  Summary of Progress:Patient participated in a discussion Bipolar Disorder and Depression and shared personal experiences they have with their own symptoms. Patient learned how to identify them and act early on before the symptoms increase.       Group Time: 10:30 am - 12:00 pm   Participation Level:  Active  Behavioral Response: Appropriate  Type of Therapy: Psycho-education Group  Summary of Progress: Patient learned about Anxiety and how to recognize symptoms and act quickly using various coping skills to manage the symptoms.   Tresa Res, LCSW

## 2012-07-19 ENCOUNTER — Other Ambulatory Visit (HOSPITAL_COMMUNITY): Payer: Medicare Other | Admitting: Psychiatry

## 2012-07-19 DIAGNOSIS — F329 Major depressive disorder, single episode, unspecified: Secondary | ICD-10-CM

## 2012-07-19 NOTE — Progress Notes (Signed)
    Daily Group Progress Note  Program: IOP  Group Time: 9:00-10:30 am   Participation Level: Active  Behavioral Response: Appropriate  Type of Therapy:  Process Group  Summary of Progress:Patient continues to work through grief over the loss of a relationship with his ex-girlfriend. He was very tearful in group and explained that he had contact with the ex this morning. He came to realize that he is stuck not knowing whether he really wants another relationship with this woman again or not. He explained that he cares for her but also knows the relationship was not good for him. He continues to try to figure out what would be best for him.      Group Time: 10:30 am - 12:00 pm   Participation Level:  Active  Behavioral Response: Appropriate  Type of Therapy: Psycho-education Group  Summary of Progress: Patient learned the DBT skill of Mindfulness and how to use it for emotion regulation and to be more effective in daily interactions.   Tresa Res, LCSW

## 2012-07-19 NOTE — Progress Notes (Signed)
Patient ID: Travis [name], male   DOB: Apr 29, 1951, 62 y.o.   MRN: [id number] D:  This is a 62 y.o caucasian male, who was transitioned from the inpatient unit. He was admitted on the inpatient unit due to two recent suicide attempts in the past two months. According to pt, during his first appointment with psychologist (Dr. Apolonio Schneiders), he (pt) was escorted to [facility] for admission. Pt admits to overdosing on Xanax and drinking ETOH ~ 2 1/2 weeks ago. And a month before that he attempted the same thing. Trigger: 1) Unresolved grief/loss issues: In November 2013, relationship of three years ended with girlfriend. According to pt, she is seeing someone else now. Pt states she is twenty years younger than him. 2) Financial Strain: After dating for ~ six months, pt and his ex-girlfriend purchased a home together. "Since she is gone it is difficult for me to pay all the bills by myself." 3) Conflictual relationships: Pt states there is conflict between he, ex-girlfriend, niece and his niece's daughter. Apparently, the niece's daughter use to reside with him and ex-girlfriend. 4) No support system. Pt's overall mood is improved.  Reports feeling anxious about being discharged.  Denies any SI/HI or A/V hallucinations. States the groups were helpful.  Coping better with stressors.  Applying the skills that he learned. A:  D/C today.  Follow up with Dr. Cheryln Manly and PCP.  R:  Pt receptive.

## 2012-07-19 NOTE — Progress Notes (Signed)
Discharge Note  Patient:  Travis [name] is an 62 y.o., male DOB:  [birth year]  Date of Admission:  07/08/12  Date of Discharge: 07/19/12  Reason for Admission: Depression and anxiety , Transferred from Inpatient unit for suicidal [place] Hospital Course:Pt started IOP and due to his pqain his Neurontin was increased to 1200 mg q day , and because of Insomnia he was started on Thorazine 10 mg q d . He responded well to the meds with improvement in his symptoms. He talked about his relationships in groups and did well . He was coping well and tol his meds well. Had no Si / HI.  Mental Status at [place], O/3, affect-full, mood-good mildly anxious,. No SI / No HI/ No hallucinations / delusions. Recent and Remote memory-good, judgement / insight-good, concentration/ recall-good.  Lab Results: No results found for this or any previous visit (from the past 48 hour(s)).  Current outpatient prescriptions:amLODipine (NORVASC) 5 MG tablet, Take 1 tablet (5 mg total) by mouth daily., Disp: 30 tablet, Rfl: 1;  aspirin 81 MG tablet, Take 1 tablet (81 mg total) by mouth daily., Disp: 30 tablet, Rfl: 0;  atenolol (TENORMIN) 25 MG tablet, Take 1 tablet (25 mg total) by mouth daily., Disp: 30 tablet, Rfl: 1;  atorvastatin (LIPITOR) 20 MG tablet, Take 1 tablet (20 mg total) by mouth daily., Disp: 90 tablet, Rfl: 3 benazepril (LOTENSIN) 20 MG tablet, Take 1 tablet (20 mg total) by mouth daily., Disp: 30 tablet, Rfl: 0;  buPROPion (WELLBUTRIN XL) 300 MG 24 hr tablet, Take 1 tablet (300 mg total) by mouth daily., Disp: 30 tablet, Rfl: 0;  chlorproMAZINE (THORAZINE) 10 MG tablet, Take 1 tablet (10 mg total) by mouth at bedtime., Disp: 30 tablet, Rfl: 0 clonazePAM (KLONOPIN) 1 MG tablet, Take 1 tablet (1 mg total) by mouth 2 (two) times daily., Disp: 30 tablet, Rfl: 0;  diclofenac sodium (VOLTAREN) 1 % GEL, Apply 2 g topically 3 (three) times daily., Disp: 1 Tube, Rfl: 0;  FLUoxetine (PROZAC) 40 MG capsule,  Take 1 capsule (40 mg total) by mouth daily., Disp: 30 capsule, Rfl: 0;  fluticasone (FLONASE) 50 MCG/ACT nasal spray, Place 1 spray into the nose daily., Disp: 16 g, Rfl: 0 gabapentin (NEURONTIN) 400 MG capsule, Take 1 capsule (400 mg total) by mouth 2 (two) times daily., Disp: 60 capsule, Rfl: 0;  indomethacin (INDOCIN) 25 MG capsule, Take 1 capsule (25 mg total) by mouth 4 (four) times daily., Disp: 120 capsule, Rfl: 0;  isosorbide mononitrate (IMDUR) 60 MG 24 hr tablet, Take 1.5 tablets (90 mg total) by mouth daily., Disp: 60 tablet, Rfl: 0 nitroGLYCERIN (NITROSTAT) 0.4 MG SL tablet, Place 1 tablet (0.4 mg total) under the tongue every 5 (five) minutes as needed for chest pain., Disp: 25 tablet, Rfl: 3;  oxyCODONE (ROXICODONE) 15 MG immediate release tablet, Take 1 tablet (15 mg total) by mouth every 4 (four) hours as needed for pain., Disp: 150 tablet, Rfl: 0;  prasugrel (EFFIENT) 10 MG TABS, Take 1 tablet (10 mg total) by mouth daily., Disp: 30 tablet, Rfl: 1 traMADol (ULTRAM) 50 MG tablet, Take 1 tablet (50 mg total) by mouth every 6 (six) hours as needed., Disp: 30 tablet, Rfl: 0;  traZODone (DESYREL) 50 MG tablet, Take 1 tablet (50 mg total) by mouth at bedtime and may repeat dose one time if needed., Disp: 30 tablet, Rfl: 0  Axis Diagnosis:   Axis I: Generalized Anxiety Disorder and Major Depression, Recurrent severe Axis II: Cluster B  Traits Axis III:  Past Medical History  Diagnosis Date  . Impaired glucose tolerance 09/27/2010  . HYPERLIPIDEMIA-MIXED 07/15/2009  . DEPRESSION 06/24/2009  . Chronic pain syndrome 10/27/2009  . HYPERTENSION 06/24/2009  . CORONARY ARTERY DISEASE 06/24/2009    s/p multiple PCIs  . URETHRAL STRICTURE 06/24/2009  . DEGENERATIVE JOINT DISEASE 06/24/2009  . SHOULDER PAIN, BILATERAL 06/24/2009  . ANKLE PAIN, RIGHT 06/24/2009  . [place] DISEASE, LUMBAR 04/19/2010  . SCIATICA, LEFT 04/19/2010  . FATIGUE 06/24/2009  . NEPHROLITHIASIS, HX OF 06/24/2009  . ERECTILE DYSFUNCTION, ORGANIC  05/30/2010  . OSA (obstructive sleep apnea)     not using CPAP very often  . Obesity   . Allergy to clopidogrel   . ALLERGIC RHINITIS 06/24/2009  . Anxiety 11/12/2011  . Anxiety   . Depression    Axis IV: other psychosocial or environmental problems, problems related to social environment and problems with primary support group Axis V: 61-70 mild symptoms   Level of Care:  OP  Discharge destination:  Home  Is patient on multiple antipsychotic therapies at discharge:  No    Has Patient had three or more failed trials of antipsychotic monotherapy by history:  No  Patient phone:  [phone] (home)  Patient address:   [address] [place] [address],   Follow-up recommendations:  Activity:  as tolerated Diet:  Regular Other:  Follow up with Dr Cheryln Manly for therapy and his PCP Dr Jenny Reichmann for therapy.  Comments:    The patient received suicide prevention pamphlet:  yes   Erin Sons 07/19/2012, 10:04 AM

## 2012-07-19 NOTE — Patient Instructions (Addendum)
Patient completed MH-IOP today.  Will follow up with Dr. Apolonio Schneiders and PCP.  Encouraged support groups.

## 2012-07-22 ENCOUNTER — Other Ambulatory Visit (HOSPITAL_COMMUNITY): Payer: Medicare Other

## 2012-07-22 NOTE — Progress Notes (Signed)
    Daily Group Progress Note  Program: IOP  Group Time: 9:00-10:30 am   Participation Level: Active  Behavioral Response: Appropriate  Type of Therapy:  Process Group  Summary of Progress: today is patients last day in the group and he reports significant improvement in his depression, anxiety and self esteem. He states he learned how to better cope with the loss of his recent breakup and is learning ways to move on with his life in a healthier way.      Group Time: 10:30 am - 12:00 pm   Participation Level:  Active  Behavioral Response: Appropriate  Type of Therapy: Psycho-education Group  Summary of Progress: patient participated in the goodbye ceremony and said goodbye to members and practiced the skill of healthy closure.   Tresa Res, LCSW

## 2012-07-23 ENCOUNTER — Other Ambulatory Visit (HOSPITAL_COMMUNITY): Payer: Medicare Other

## 2012-07-24 ENCOUNTER — Other Ambulatory Visit (HOSPITAL_COMMUNITY): Payer: Medicare Other

## 2012-07-24 ENCOUNTER — Other Ambulatory Visit: Payer: Self-pay | Admitting: Cardiology

## 2012-07-24 ENCOUNTER — Telehealth: Payer: Self-pay | Admitting: Internal Medicine

## 2012-07-24 MED ORDER — ZOLPIDEM TARTRATE 10 MG PO TABS: ORAL_TABLET | ORAL | Status: DC

## 2012-07-24 NOTE — Telephone Encounter (Signed)
Faxed hardcopy to pharmacy. 

## 2012-07-24 NOTE — Telephone Encounter (Signed)
Done hardcopy to [facility]  

## 2012-07-24 NOTE — Telephone Encounter (Signed)
Requesting a refill on ambein.  [facility].

## 2012-07-25 ENCOUNTER — Other Ambulatory Visit (HOSPITAL_COMMUNITY): Payer: Medicare Other

## 2012-07-25 ENCOUNTER — Ambulatory Visit (INDEPENDENT_AMBULATORY_CARE_PROVIDER_SITE_OTHER): Payer: Medicare Other | Admitting: Psychology

## 2012-07-25 DIAGNOSIS — F331 Major depressive disorder, recurrent, moderate: Secondary | ICD-10-CM

## 2012-07-26 ENCOUNTER — Other Ambulatory Visit (HOSPITAL_COMMUNITY): Payer: Medicare Other

## 2012-07-29 ENCOUNTER — Other Ambulatory Visit (HOSPITAL_COMMUNITY): Payer: Medicare Other

## 2012-07-29 ENCOUNTER — Telehealth: Payer: Self-pay | Admitting: Physical Medicine & Rehabilitation

## 2012-07-29 MED ORDER — OXYCODONE HCL 15 MG PO TABS: 15.0000 mg | ORAL_TABLET | ORAL | Status: DC | PRN

## 2012-07-29 NOTE — Telephone Encounter (Signed)
We see him every 2 month, and he picks up his medication inbetween. Is ok to fill for this month and we see him for the next refill.

## 2012-07-29 NOTE — Telephone Encounter (Signed)
Oxycodone refilled.  Patient aware.

## 2012-07-29 NOTE — Telephone Encounter (Signed)
Requesting refill of Oxycodone 15 mg.

## 2012-07-30 ENCOUNTER — Ambulatory Visit: Payer: Medicare Other | Admitting: Psychology

## 2012-08-05 ENCOUNTER — Other Ambulatory Visit: Payer: Self-pay

## 2012-08-05 MED ORDER — FLUOXETINE HCL 40 MG PO CAPS: 40.0000 mg | ORAL_CAPSULE | Freq: Every day | ORAL | Status: DC

## 2012-08-05 MED ORDER — BUPROPION HCL ER (XL) 300 MG PO TB24: 300.0000 mg | ORAL_TABLET | Freq: Every day | ORAL | Status: DC

## 2012-08-05 NOTE — Telephone Encounter (Signed)
Done erx 

## 2012-08-08 ENCOUNTER — Ambulatory Visit (INDEPENDENT_AMBULATORY_CARE_PROVIDER_SITE_OTHER): Payer: Medicare Other | Admitting: Psychology

## 2012-08-08 DIAGNOSIS — F331 Major depressive disorder, recurrent, moderate: Secondary | ICD-10-CM

## 2012-08-12 ENCOUNTER — Telehealth: Payer: Self-pay | Admitting: Cardiology

## 2012-08-12 MED ORDER — ISOSORBIDE MONONITRATE ER 60 MG PO TB24: 90.0000 mg | ORAL_TABLET | Freq: Every day | ORAL | Status: DC

## 2012-08-12 NOTE — Telephone Encounter (Signed)
New Prob   Isosorbide 60 mg  to [facility] on [address]

## 2012-08-13 ENCOUNTER — Telehealth: Payer: Self-pay

## 2012-08-13 MED ORDER — INDOMETHACIN 25 MG PO CAPS: 25.0000 mg | ORAL_CAPSULE | Freq: Four times a day (QID) | ORAL | Status: DC

## 2012-08-13 MED ORDER — BENAZEPRIL HCL 20 MG PO TABS: 20.0000 mg | ORAL_TABLET | Freq: Every day | ORAL | Status: DC

## 2012-08-13 MED ORDER — CYCLOBENZAPRINE HCL 5 MG PO TABS: 5.0000 mg | ORAL_TABLET | Freq: Three times a day (TID) | ORAL | Status: DC | PRN

## 2012-08-13 NOTE — Telephone Encounter (Signed)
Done erx 

## 2012-08-13 NOTE — Telephone Encounter (Signed)
The patient would like to go back to generic flexeril as alternative does not work.  Please advise on refills

## 2012-08-14 ENCOUNTER — Telehealth: Payer: Self-pay

## 2012-08-14 NOTE — Telephone Encounter (Signed)
We are not the prescriber on the tramadol. Will not be refilled. Dr Letta Pate to review the narcotics due to inconsistent drug screen and [facility] admission in February @ ED visit. Opiates and benzos negative when should be positive for drug and metabolites. This is the second UDS negative for metabolites though takes oxy 15 mg 5xday. Aundria Mems PA will discuss with Dr Letta Pate.

## 2012-08-14 NOTE — Telephone Encounter (Signed)
Patient called to get tramadol refilled at [facility] drug.

## 2012-08-15 NOTE — Telephone Encounter (Signed)
I talked to Dr. Read Drivers, the patient will be d/c, we will taper him off by 1 tablet per day per week. He is now taking 13m 5 times a day. He picked up his medication on 07/29/2012, he should have 65 tablets left.  He should take 4 tablets a day for the next week ( = week 1) Week 2 : he should take 3 tablets /day Week 3 : he should take 2 tablets per day, Week 4 : he should take 1 tablet per day He will be short of 5 tablets in the 4th week, [place] should [place] and come by our office, after his 3rd week, then we will give him a prescription for the last 5 tablets of 143m and then 5, 7.5 mg tablets until he stops.

## 2012-08-15 NOTE — Telephone Encounter (Signed)
As discussed with care and will discharge with a taper of oxycodone

## 2012-08-15 NOTE — Telephone Encounter (Signed)
For discharge, how do you want to direct him to taper off his oxycodone?

## 2012-08-16 ENCOUNTER — Telehealth: Payer: Self-pay | Admitting: Physical Medicine & Rehabilitation

## 2012-08-16 ENCOUNTER — Encounter: Payer: Self-pay | Admitting: *Deleted

## 2012-08-16 NOTE — Telephone Encounter (Signed)
Patient returned your call.

## 2012-08-16 NOTE — Telephone Encounter (Signed)
Attempted to call and speak with Travis [name]. His voicemail picked up and a message was left to please return our call to discuss medications.

## 2012-08-16 NOTE — Telephone Encounter (Signed)
Ok thanks 

## 2012-08-16 NOTE — Telephone Encounter (Signed)
See note attached to previous message

## 2012-08-16 NOTE — Telephone Encounter (Signed)
I spoke with Travis [name] this afternoon and explained to him Dr Letta Pate concerns about prescribing narcotics for him and that he is no longer willing to do so because of these concerns. The use of alcohol and narcotics is not safe and because of his history with this, as well as urine drug screens missing evidence of taking these medications,  it is no longer safe for either Dr Letta Pate to prescribe or for him to take them.  Travis [name] says it was just a one time stupid thing, and that he needs his medication. I told him I understood that but that since he feels he needs these medications it will be best for him to find another physician willing to prescribe for him.  I reviewed his wean down schedule but because of the complex nature of the instructions I told him I would email him the instructions as well.  We will mail him his discharge letter and include a list of other pain clinics in the area.

## 2012-08-19 ENCOUNTER — Telehealth: Payer: Self-pay | Admitting: Internal Medicine

## 2012-08-19 ENCOUNTER — Ambulatory Visit (INDEPENDENT_AMBULATORY_CARE_PROVIDER_SITE_OTHER): Payer: Medicare Other | Admitting: Psychology

## 2012-08-19 DIAGNOSIS — G894 Chronic pain syndrome: Secondary | ICD-10-CM

## 2012-08-19 DIAGNOSIS — F331 Major depressive disorder, recurrent, moderate: Secondary | ICD-10-CM

## 2012-08-19 NOTE — Telephone Encounter (Signed)
Patient would like to request Dr. Jenny Reichmann to place him back on oxycodone 35m 4 x day.  Was previously on oxycodone but was taken off by another doctor.  Can RShirlean Mylargive him a call to talk about this?

## 2012-08-19 NOTE — Telephone Encounter (Signed)
I would not be able to do this as I dont normally treat chronic pain with high dose oxycodone;  I can refer to pain clinic if he likes

## 2012-08-20 NOTE — Telephone Encounter (Signed)
Referral done

## 2012-08-20 NOTE — Telephone Encounter (Signed)
Informed the patient of MD response.  The patient does request to not be referred to previous pain management Dr. Elnita Maxwell.  His next refill date of his oxycodone is due on the 11th and would like a referral before that date if at all possible.

## 2012-08-21 NOTE — Telephone Encounter (Signed)
Patient informed of referral

## 2012-08-23 ENCOUNTER — Telehealth: Payer: Self-pay

## 2012-08-23 DIAGNOSIS — G894 Chronic pain syndrome: Secondary | ICD-10-CM

## 2012-08-23 NOTE — Telephone Encounter (Signed)
The patient was given the name of [facility] and would like a referral.

## 2012-08-23 NOTE — Telephone Encounter (Signed)
Done per emr 

## 2012-08-26 ENCOUNTER — Ambulatory Visit: Payer: Self-pay | Admitting: Cardiology

## 2012-08-26 ENCOUNTER — Telehealth: Payer: Self-pay

## 2012-08-26 MED ORDER — OXYCODONE HCL 15 MG PO TABS: 15.0000 mg | ORAL_TABLET | Freq: Four times a day (QID) | ORAL | Status: DC | PRN

## 2012-08-26 NOTE — Telephone Encounter (Signed)
Referral was place mar 31  Unfortunately, it can take up to 2 or more months to be seen locally  OK for pain med until then - Done hardcopy to robin

## 2012-08-26 NOTE — Telephone Encounter (Signed)
The patient has still not received an appointment with a pain management MD.  States he is going to [place] on Friday for 2 weeks.  He is requesting a one month prescription of his pain medication to hold while in [place] and to see new pain management.

## 2012-08-27 ENCOUNTER — Telehealth: Payer: Self-pay

## 2012-08-27 NOTE — Telephone Encounter (Signed)
Please clarify, has most recently been tx with klonopin, which is what we would prefer for him

## 2012-08-27 NOTE — Telephone Encounter (Signed)
Pharmacy requesting refill on alprazolam 1 mg please advise

## 2012-08-27 NOTE — Telephone Encounter (Signed)
Patient informed to pickup hardcopy at the front desk. Also informed of MD instructions on appt.

## 2012-08-28 ENCOUNTER — Ambulatory Visit (INDEPENDENT_AMBULATORY_CARE_PROVIDER_SITE_OTHER): Payer: No Typology Code available for payment source | Admitting: Psychology

## 2012-08-28 DIAGNOSIS — F331 Major depressive disorder, recurrent, moderate: Secondary | ICD-10-CM

## 2012-08-28 MED ORDER — CLONAZEPAM 1 MG PO TABS: 1.0000 mg | ORAL_TABLET | Freq: Two times a day (BID) | ORAL | Status: DC

## 2012-08-28 NOTE — Telephone Encounter (Signed)
Called the patient to clarify refill on alprazolam.  He stated to continue as MD preferred to do only klonopin at this time.

## 2012-08-28 NOTE — Telephone Encounter (Signed)
Faxed hardcopy to [facility].

## 2012-08-28 NOTE — Telephone Encounter (Signed)
Done hardcopy to [facility]  

## 2012-08-29 ENCOUNTER — Other Ambulatory Visit: Payer: Self-pay | Admitting: *Deleted

## 2012-08-29 MED ORDER — PRASUGREL HCL 10 MG PO TABS: ORAL_TABLET | ORAL | Status: DC

## 2012-08-30 ENCOUNTER — Ambulatory Visit: Payer: Self-pay | Admitting: Physical Medicine and Rehabilitation

## 2012-09-16 ENCOUNTER — Other Ambulatory Visit: Payer: Self-pay | Admitting: *Deleted

## 2012-09-16 MED ORDER — ATENOLOL 25 MG PO TABS: 25.0000 mg | ORAL_TABLET | Freq: Every day | ORAL | Status: DC

## 2012-09-17 ENCOUNTER — Ambulatory Visit: Payer: No Typology Code available for payment source | Admitting: Psychology

## 2012-09-17 ENCOUNTER — Other Ambulatory Visit: Payer: Self-pay | Admitting: *Deleted

## 2012-09-17 ENCOUNTER — Ambulatory Visit: Payer: Self-pay | Admitting: Cardiology

## 2012-09-17 MED ORDER — AMLODIPINE BESYLATE 5 MG PO TABS: 5.0000 mg | ORAL_TABLET | Freq: Every day | ORAL | Status: DC

## 2012-09-20 ENCOUNTER — Ambulatory Visit (INDEPENDENT_AMBULATORY_CARE_PROVIDER_SITE_OTHER): Payer: No Typology Code available for payment source | Admitting: Psychology

## 2012-09-20 DIAGNOSIS — F331 Major depressive disorder, recurrent, moderate: Secondary | ICD-10-CM

## 2012-09-30 ENCOUNTER — Ambulatory Visit: Payer: Self-pay | Admitting: Cardiology

## 2012-09-30 ENCOUNTER — Ambulatory Visit (INDEPENDENT_AMBULATORY_CARE_PROVIDER_SITE_OTHER): Payer: No Typology Code available for payment source | Admitting: Psychology

## 2012-09-30 DIAGNOSIS — F331 Major depressive disorder, recurrent, moderate: Secondary | ICD-10-CM

## 2012-10-08 ENCOUNTER — Other Ambulatory Visit: Payer: Self-pay | Admitting: *Deleted

## 2012-10-08 ENCOUNTER — Other Ambulatory Visit: Payer: Self-pay | Admitting: Internal Medicine

## 2012-10-08 MED ORDER — PRASUGREL HCL 10 MG PO TABS: ORAL_TABLET | ORAL | Status: DC

## 2012-10-08 MED ORDER — TIZANIDINE HCL 4 MG PO TABS: 4.0000 mg | ORAL_TABLET | Freq: Four times a day (QID) | ORAL | Status: DC | PRN

## 2012-10-09 ENCOUNTER — Ambulatory Visit: Payer: Self-pay | Admitting: Cardiology

## 2012-10-22 ENCOUNTER — Other Ambulatory Visit: Payer: Self-pay | Admitting: *Deleted

## 2012-10-22 MED ORDER — ISOSORBIDE MONONITRATE ER 60 MG PO TB24: 90.0000 mg | ORAL_TABLET | Freq: Every day | ORAL | Status: DC

## 2012-11-07 ENCOUNTER — Ambulatory Visit: Payer: Medicare HMO | Admitting: Internal Medicine

## 2012-11-07 DIAGNOSIS — Z0289 Encounter for other administrative examinations: Secondary | ICD-10-CM

## 2012-11-11 ENCOUNTER — Telehealth: Payer: Self-pay

## 2012-11-11 NOTE — Telephone Encounter (Signed)
A user error has taken place: encounter opened in error, closed for administrative reasons.

## 2012-11-14 ENCOUNTER — Other Ambulatory Visit: Payer: Self-pay

## 2012-11-14 MED ORDER — INDOMETHACIN 25 MG PO CAPS: 25.0000 mg | ORAL_CAPSULE | Freq: Four times a day (QID) | ORAL | Status: DC

## 2012-11-18 ENCOUNTER — Other Ambulatory Visit: Payer: Self-pay | Admitting: Cardiology

## 2012-12-10 ENCOUNTER — Other Ambulatory Visit: Payer: Self-pay | Admitting: Cardiology

## 2012-12-24 ENCOUNTER — Other Ambulatory Visit: Payer: Self-pay | Admitting: Cardiology

## 2013-01-09 ENCOUNTER — Other Ambulatory Visit: Payer: Self-pay | Admitting: Cardiology

## 2013-01-09 ENCOUNTER — Other Ambulatory Visit: Payer: Self-pay | Admitting: Internal Medicine

## 2013-01-30 ENCOUNTER — Emergency Department (HOSPITAL_COMMUNITY): Payer: Medicare HMO

## 2013-01-30 ENCOUNTER — Emergency Department (HOSPITAL_COMMUNITY)
Admission: EM | Admit: 2013-01-30 | Discharge: 2013-01-30 | Disposition: A | Discharge status: Home / Self Care | Payer: Medicare HMO | Attending: Emergency Medicine | Admitting: Emergency Medicine

## 2013-01-30 ENCOUNTER — Encounter (HOSPITAL_COMMUNITY): Payer: Self-pay | Admitting: *Deleted

## 2013-01-30 ENCOUNTER — Other Ambulatory Visit: Payer: Self-pay

## 2013-01-30 DIAGNOSIS — F3289 Other specified depressive episodes: Secondary | ICD-10-CM | POA: Insufficient documentation

## 2013-01-30 DIAGNOSIS — N200 Calculus of kidney: Secondary | ICD-10-CM | POA: Insufficient documentation

## 2013-01-30 DIAGNOSIS — Z8709 Personal history of other diseases of the respiratory system: Secondary | ICD-10-CM | POA: Insufficient documentation

## 2013-01-30 DIAGNOSIS — G894 Chronic pain syndrome: Secondary | ICD-10-CM | POA: Insufficient documentation

## 2013-01-30 DIAGNOSIS — G4733 Obstructive sleep apnea (adult) (pediatric): Secondary | ICD-10-CM | POA: Insufficient documentation

## 2013-01-30 DIAGNOSIS — Z9861 Coronary angioplasty status: Secondary | ICD-10-CM | POA: Insufficient documentation

## 2013-01-30 DIAGNOSIS — M1990 Unspecified osteoarthritis, unspecified site: Secondary | ICD-10-CM | POA: Insufficient documentation

## 2013-01-30 DIAGNOSIS — IMO0002 Reserved for concepts with insufficient information to code with codable children: Secondary | ICD-10-CM | POA: Insufficient documentation

## 2013-01-30 DIAGNOSIS — M5430 Sciatica, unspecified side: Secondary | ICD-10-CM | POA: Insufficient documentation

## 2013-01-30 DIAGNOSIS — E669 Obesity, unspecified: Secondary | ICD-10-CM | POA: Insufficient documentation

## 2013-01-30 DIAGNOSIS — Z791 Long term (current) use of non-steroidal anti-inflammatories (NSAID): Secondary | ICD-10-CM | POA: Insufficient documentation

## 2013-01-30 DIAGNOSIS — M5137 Other intervertebral disc degeneration, lumbosacral region: Secondary | ICD-10-CM | POA: Insufficient documentation

## 2013-01-30 DIAGNOSIS — I2510 Atherosclerotic heart disease of native coronary artery without angina pectoris: Secondary | ICD-10-CM | POA: Insufficient documentation

## 2013-01-30 DIAGNOSIS — F329 Major depressive disorder, single episode, unspecified: Secondary | ICD-10-CM | POA: Insufficient documentation

## 2013-01-30 DIAGNOSIS — I10 Essential (primary) hypertension: Secondary | ICD-10-CM | POA: Insufficient documentation

## 2013-01-30 DIAGNOSIS — Z7982 Long term (current) use of aspirin: Secondary | ICD-10-CM | POA: Insufficient documentation

## 2013-01-30 DIAGNOSIS — Z87891 Personal history of nicotine dependence: Secondary | ICD-10-CM | POA: Insufficient documentation

## 2013-01-30 DIAGNOSIS — E782 Mixed hyperlipidemia: Secondary | ICD-10-CM | POA: Insufficient documentation

## 2013-01-30 DIAGNOSIS — R1110 Vomiting, unspecified: Secondary | ICD-10-CM | POA: Insufficient documentation

## 2013-01-30 DIAGNOSIS — F411 Generalized anxiety disorder: Secondary | ICD-10-CM | POA: Insufficient documentation

## 2013-01-30 DIAGNOSIS — M51379 Other intervertebral disc degeneration, lumbosacral region without mention of lumbar back pain or lower extremity pain: Secondary | ICD-10-CM | POA: Insufficient documentation

## 2013-01-30 LAB — CBC WITH DIFFERENTIAL/PLATELET
Basophils Relative: 1 % (ref 0–1)
HCT: 36.9 % — ABNORMAL LOW (ref 39.0–52.0)
Hemoglobin: 12.6 g/dL — ABNORMAL LOW (ref 13.0–17.0)
Lymphocytes Relative: 23 % (ref 12–46)
Lymphs Abs: 1.5 10*3/uL (ref 0.7–4.0)
Monocytes Absolute: 0.5 10*3/uL (ref 0.1–1.0)
Monocytes Relative: 8 % (ref 3–12)
Neutro Abs: 4.2 10*3/uL (ref 1.7–7.7)
Neutrophils Relative %: 66 % (ref 43–77)
RBC: 4.05 MIL/uL — ABNORMAL LOW (ref 4.22–5.81)
WBC: 6.4 10*3/uL (ref 4.0–10.5)

## 2013-01-30 LAB — URINALYSIS, ROUTINE W REFLEX MICROSCOPIC
Ketones, ur: 40 mg/dL — AB
Leukocytes, UA: NEGATIVE
Nitrite: NEGATIVE
Protein, ur: NEGATIVE mg/dL
pH: 5 (ref 5.0–8.0)

## 2013-01-30 LAB — COMPREHENSIVE METABOLIC PANEL
Albumin: 4 g/dL (ref 3.5–5.2)
Alkaline Phosphatase: 55 U/L (ref 39–117)
BUN: 18 mg/dL (ref 6–23)
CO2: 22 mEq/L (ref 19–32)
Chloride: 105 mEq/L (ref 96–112)
Creatinine, Ser: 0.98 mg/dL (ref 0.50–1.35)
GFR calc non Af Amer: 88 mL/min — ABNORMAL LOW (ref >90)
Glucose, Bld: 123 mg/dL — ABNORMAL HIGH (ref 70–99)
Potassium: 4.2 mEq/L (ref 3.5–5.1)
Total Bilirubin: 0.4 mg/dL (ref 0.3–1.2)

## 2013-01-30 LAB — APTT: aPTT: 27 seconds (ref 24–37)

## 2013-01-30 LAB — TYPE AND SCREEN: Antibody Screen: NEGATIVE

## 2013-01-30 LAB — POCT I-STAT TROPONIN I

## 2013-01-30 LAB — ABO/RH: ABO/RH(D): A POS

## 2013-01-30 MED ORDER — HYDROMORPHONE HCL 2 MG PO TABS: 2.0000 mg | ORAL_TABLET | ORAL | Status: DC | PRN

## 2013-01-30 MED ORDER — HYDROMORPHONE HCL PF 1 MG/ML IJ SOLN
1.0000 mg | Freq: Once | INTRAMUSCULAR | Status: AC
  Administered 2013-01-30: 1 mg via INTRAVENOUS

## 2013-01-30 MED ORDER — IBUPROFEN 800 MG PO TABS: 800.0000 mg | ORAL_TABLET | Freq: Three times a day (TID) | ORAL | Status: DC

## 2013-01-30 MED ORDER — DIAZEPAM 5 MG/ML IJ SOLN
5.0000 mg | Freq: Once | INTRAMUSCULAR | Status: DC
  Filled 2013-01-30: qty 2

## 2013-01-30 MED ORDER — TAMSULOSIN HCL 0.4 MG PO CAPS
0.4000 mg | ORAL_CAPSULE | Freq: Once | ORAL | Status: AC
  Administered 2013-01-30: 0.4 mg via ORAL
  Filled 2013-01-30: qty 1

## 2013-01-30 MED ORDER — TAMSULOSIN HCL 0.4 MG PO CAPS: 0.4000 mg | ORAL_CAPSULE | Freq: Every day | ORAL | Status: DC

## 2013-01-30 MED ORDER — IOHEXOL 350 MG/ML SOLN
100.0000 mL | Freq: Once | INTRAVENOUS | Status: AC | PRN
  Administered 2013-01-30: 100 mL via INTRAVENOUS

## 2013-01-30 MED ORDER — ONDANSETRON HCL 4 MG PO TABS: 4.0000 mg | ORAL_TABLET | Freq: Four times a day (QID) | ORAL | Status: DC

## 2013-01-30 MED ORDER — ONDANSETRON HCL 4 MG/2ML IJ SOLN
4.0000 mg | Freq: Once | INTRAMUSCULAR | Status: AC
  Administered 2013-01-30: 4 mg via INTRAVENOUS
  Filled 2013-01-30: qty 2

## 2013-01-30 MED ORDER — KETOROLAC TROMETHAMINE 30 MG/ML IJ SOLN
30.0000 mg | Freq: Once | INTRAMUSCULAR | Status: AC
Start: 2013-01-30 — End: 2013-01-30
  Administered 2013-01-30: 30 mg via INTRAVENOUS
  Filled 2013-01-30: qty 1

## 2013-01-30 MED ORDER — HYDROMORPHONE HCL PF 1 MG/ML IJ SOLN
1.0000 mg | Freq: Once | INTRAMUSCULAR | Status: DC
  Filled 2013-01-30: qty 1

## 2013-01-30 MED ORDER — HYDROMORPHONE HCL PF 1 MG/ML IJ SOLN
1.0000 mg | Freq: Once | INTRAMUSCULAR | Status: AC
  Administered 2013-01-30: 1 mg via INTRAVENOUS
  Filled 2013-01-30: qty 1

## 2013-01-30 MED ORDER — SODIUM CHLORIDE 0.9 % IV BOLUS (SEPSIS)
1000.0000 mL | Freq: Once | INTRAVENOUS | Status: AC
  Administered 2013-01-30: 1000 mL via INTRAVENOUS

## 2013-01-30 NOTE — ED Notes (Signed)
Patient transported to CT 

## 2013-01-30 NOTE — ED Notes (Signed)
MD at bedside. Dr. Leonides Schanz at bedside.

## 2013-01-30 NOTE — ED Notes (Signed)
MD at bedside. 

## 2013-01-30 NOTE — ED Notes (Signed)
Pt arrived ED via POV with c/o rightsided low back pain that is non-radiating.  Pt has signig hx of heart disease.  Pt +N/V in trige.

## 2013-01-30 NOTE — ED Provider Notes (Signed)
TIME SEEN: 2:00 PM  CHIEF COMPLAINT: Right lower back pain, vomiting  HPI: Patient is a 62 y.o. male with history of hypertension, hyperlipidemia, DM, CAD status post multiple stents currently on Effient who presents emergency department with acute onset right lower back pain that started at approximately 1:30 this afternoon. States the pain came on while he was at rest. He describes as a sharp, intense pain. He describes as severe. He states it is worse with movement. No alleviating factors. He has never had similar symptoms in the past. He denies any fever, chest pain, shortness of breath, abdominal pain. He has had multiple episodes of vomiting. No diarrhea. No pain in his arms or legs. No numbness, tingling or focal weakness. No bowel or bladder incontinence.  ROS: See HPI Constitutional: no fever  Eyes: no drainage  ENT: no runny nose   Cardiovascular:  no chest pain  Resp: no SOB  GI: no vomiting GU: no dysuria Integumentary: no rash  Allergy: no hives  Musculoskeletal: no leg swelling  Neurological: no slurred speech ROS otherwise negative  PAST MEDICAL HISTORY/PAST SURGICAL HISTORY:  Past Medical History  Diagnosis Date  . Impaired glucose tolerance 09/27/2010  . HYPERLIPIDEMIA-MIXED 07/15/2009  . DEPRESSION 06/24/2009  . Chronic pain syndrome 10/27/2009  . HYPERTENSION 06/24/2009  . CORONARY ARTERY DISEASE 06/24/2009    s/p multiple PCIs  . URETHRAL STRICTURE 06/24/2009  . DEGENERATIVE JOINT DISEASE 06/24/2009  . SHOULDER PAIN, BILATERAL 06/24/2009  . ANKLE PAIN, RIGHT 06/24/2009  . [place] DISEASE, LUMBAR 04/19/2010  . SCIATICA, LEFT 04/19/2010  . FATIGUE 06/24/2009  . NEPHROLITHIASIS, HX OF 06/24/2009  . ERECTILE DYSFUNCTION, ORGANIC 05/30/2010  . OSA (obstructive sleep apnea)     not using CPAP very often  . Obesity   . Allergy to clopidogrel   . ALLERGIC RHINITIS 06/24/2009  . Anxiety 11/12/2011  . Anxiety   . Depression     MEDICATIONS:  Prior to Admission medications   Medication  Sig Start Date End Date Taking? Authorizing Provider  amLODipine (NORVASC) 5 MG tablet TAKE 1 TABLET BY MOUTH EVERY DAY 12/24/12  Yes Larey Dresser, MD  aspirin 81 MG tablet Take 1 tablet (81 mg total) by mouth daily. 06/28/12  Yes Waylan Boga, NP  atenolol (TENORMIN) 25 MG tablet TAKE 1 TABLET BY MOUTH EVERY DAY 11/18/12  Yes Larey Dresser, MD  atorvastatin (LIPITOR) 20 MG tablet Take 20 mg by mouth at bedtime.   Yes Historical Provider, MD  benazepril (LOTENSIN) 20 MG tablet Take 20 mg by mouth at bedtime.   Yes Historical Provider, MD  buPROPion (WELLBUTRIN XL) 300 MG 24 hr tablet Take 1 tablet (300 mg total) by mouth daily. 08/05/12  Yes [name], MD  chlorproMAZINE (THORAZINE) 10 MG tablet Take 1 tablet (10 mg total) by mouth at bedtime. 07/09/12  Yes Leonides Grills, MD  clonazePAM (KLONOPIN) 1 MG tablet Take 1 tablet (1 mg total) by mouth 2 (two) times daily. 08/28/12  Yes [name], MD  cyclobenzaprine (FLEXERIL) 5 MG tablet Take 1 tablet (5 mg total) by mouth 3 (three) times daily as needed for muscle spasms. 08/13/12  Yes [name], MD  diclofenac sodium (VOLTAREN) 1 % GEL Apply 2 g topically 3 (three) times daily. 06/28/12  Yes Waylan Boga, NP  EFFIENT 10 MG TABS TAKE 1 TABLET BY MOUTH DAILY 12/10/12  Yes Larey Dresser, MD  FLUoxetine (PROZAC) 40 MG capsule Take 1 capsule (40 mg total) by mouth  daily. 08/05/12  Yes [name], MD  fluticasone [facility]) 50 MCG/ACT nasal spray Place 1 spray into the nose daily. 06/28/12  Yes Waylan Boga, NP  gabapentin (NEURONTIN) 400 MG capsule Take 1 capsule (400 mg total) by mouth 2 (two) times daily. 06/28/12  Yes Waylan Boga, NP  indomethacin (INDOCIN) 25 MG capsule TAKE ONE CAPSULE BY MOUTH FOUR TIMES DAILY 01/09/13  Yes [name], MD  isosorbide mononitrate (IMDUR) 60 MG 24 hr tablet TAKE 1 AND 1/2 TABLETS BY MOUTH DAILY 01/09/13  Yes Larey Dresser, MD  metFORMIN (GLUCOPHAGE) 500 MG tablet Take 500 mg by mouth 2 (two) times daily with a  meal.   Yes Historical Provider, MD  oxyCODONE (ROXICODONE) 15 MG immediate release tablet Take 1 tablet (15 mg total) by mouth every 6 (six) hours as needed for pain. With limit 4 per day 08/26/12  Yes [name], MD  tiZANidine (ZANAFLEX) 4 MG tablet Take 1 tablet (4 mg total) by mouth every 6 (six) hours as needed. 10/08/12  Yes [name], MD  traZODone (DESYREL) 50 MG tablet Take 1 tablet (50 mg total) by mouth at bedtime and may repeat dose one time if needed. 06/28/12  Yes Waylan Boga, NP  zolpidem (AMBIEN) 10 MG tablet 2 tabs by mouth at bedtime as needed for sleep 07/24/12  Yes [name], MD    ALLERGIES:  No Known Allergies  SOCIAL HISTORY:  History  Substance Use Topics  . Smoking status: Former Smoker    Types: Cigars    Quit date: 08/28/2010  . Smokeless tobacco: Never Used     Comment: rare  . Alcohol Use: 0.0 oz/week     Comment: occasional    FAMILY HISTORY: Family History  Problem Relation Age of Onset  . Depression Mother   . Heart disease Mother   . Hypertension Mother   . Cancer Mother     Breast  . Diabetes Father   . Heart disease Father     CABG  . Cancer Father     prostate and skin cancer  . Hypertension Father   . Hyperlipidemia Father   . Depression Brother     x 2  . Hypertension Brother     x2  . Coronary artery disease Other   . Hypertension Other   . Depression Other   . Heart disease Maternal Grandfather   . Early death Maternal Grandfather 26    heart attack  . Early death Paternal Grandfather     EXAM: BP 149/62  Pulse 52  Ht 6' 1"  (1.854 m)  Wt 275 lb (124.739 kg)  BMI 36.29 kg/m2  SpO2 96% CONSTITUTIONAL: Alert and oriented and responds appropriately to questions. Appears very uncomfortable, crying, diaphoretic, vomiting HEAD: Normocephalic EYES: Conjunctivae clear, PERRL ENT: normal nose; no rhinorrhea; moist mucous membranes; pharynx without lesions noted NECK: Supple, no meningismus, no LAD  CARD: RRR; S1 and S2  appreciated; no murmurs, no clicks, no rubs, no gallops RESP: Normal chest excursion without splinting or tachypnea; breath sounds clear and equal bilaterally; no wheezes, no rhonchi, no rales,  ABD/GI: Normal bowel sounds; non-distended; soft, non-tender, no rebound, no guarding BACK:  Tender to palpation over the right lumbar spinal region, no midline spinal tenderness, no CVA tenderness EXT: Normal ROM in all joints; non-tender to palpation; no edema; normal capillary refill; no cyanosis; 2+ DP and radial pulses bilaterally   SKIN: Normal color for age and race; warm NEURO: Moves  all extremities equally; pt walks hunched over due to pain, sensation to light touch intact diffusely PSYCH: The patient's mood and manner are appropriate. Grooming and personal hygiene are appropriate.  MEDICAL DECISION MAKING: Patient with acute onset right-sided back pain. Given his appearance, I am concerned for possible dissection. Also a differential includes muscle spasm versus kidney stone. Will obtain CT chest, abdomen and pelvis. Will obtain labs, urine.  Will give pain meds, antiemetics.  Pt is hemodynamically stable.    ED PROGRESS: Patient has a right-sided 5-6 mm UVJ stone with mild hydronephrosis. Urine shows no sign of infection. Labs are otherwise unremarkable. Patient is still in significant pain after 2 doses of Dilaudid. We'll give Toradol and Flomax and reassess.   Date: 01/30/2013  Rate: 54  Rhythm: normal sinus rhythm  QRS Axis: normal  Intervals: normal  ST/T Wave abnormalities: normal  Conduction Disutrbances: none  Narrative Interpretation: unremarkable; diffuse ST flattening, no change compared to prior EKG, no new ischemic changes    Patient's pain is completely resolved after Toradol. He feels much better and is ready for discharge home. We'll discharge with urine strainer, prescription for pain medication, antiemetics and Flomax. Will get outpatient urology followup. Given return  precautions. Patient verbalizes understanding is comfortable this plan.  [place], DO 01/30/13 1737

## 2013-01-30 NOTE — ED Notes (Signed)
Pt felt like he had to pee at 1230 today. Pt had a couple drops  Pass then felt extreme back pain. Pt last urination was was at 8 am this am. Pt has been unable to pass urine since.

## 2013-02-05 ENCOUNTER — Telehealth: Payer: Self-pay

## 2013-02-05 DIAGNOSIS — G894 Chronic pain syndrome: Secondary | ICD-10-CM

## 2013-02-05 NOTE — Telephone Encounter (Signed)
[place] with me  Referral done

## 2013-02-05 NOTE — Telephone Encounter (Signed)
The patient has been living out of town and is now back in [place].  He has appointment with PCP not until November.  He would like a new referral for pain management with Dr. Cherlynn Polo [phone]  He will be out in October of his Oxy 15 mg taking 4 times a day.  Would like to know if Dr. Jenny Reichmann would refill until he gets in with pain management?

## 2013-02-06 ENCOUNTER — Encounter: Payer: Self-pay | Admitting: Internal Medicine

## 2013-02-06 ENCOUNTER — Ambulatory Visit (INDEPENDENT_AMBULATORY_CARE_PROVIDER_SITE_OTHER): Payer: Medicare HMO | Admitting: Internal Medicine

## 2013-02-06 VITALS — BP 118/62 | HR 62 | Temp 99.1°F | Ht 73.0 in | Wt 279.2 lb

## 2013-02-06 DIAGNOSIS — L84 Corns and callosities: Secondary | ICD-10-CM

## 2013-02-06 DIAGNOSIS — F419 Anxiety disorder, unspecified: Secondary | ICD-10-CM

## 2013-02-06 DIAGNOSIS — IMO0001 Reserved for inherently not codable concepts without codable children: Secondary | ICD-10-CM

## 2013-02-06 DIAGNOSIS — G4700 Insomnia, unspecified: Secondary | ICD-10-CM

## 2013-02-06 DIAGNOSIS — F411 Generalized anxiety disorder: Secondary | ICD-10-CM

## 2013-02-06 DIAGNOSIS — Z0000 Encounter for general adult medical examination without abnormal findings: Secondary | ICD-10-CM

## 2013-02-06 DIAGNOSIS — G894 Chronic pain syndrome: Secondary | ICD-10-CM

## 2013-02-06 HISTORY — DX: Corns and callosities: L84

## 2013-02-06 MED ORDER — TRAZODONE HCL 50 MG PO TABS: 100.0000 mg | ORAL_TABLET | Freq: Every day | ORAL | Status: DC

## 2013-02-06 MED ORDER — CYCLOBENZAPRINE HCL 5 MG PO TABS: 5.0000 mg | ORAL_TABLET | Freq: Three times a day (TID) | ORAL | Status: DC | PRN

## 2013-02-06 MED ORDER — CLONAZEPAM 1 MG PO TABS: 1.0000 mg | ORAL_TABLET | Freq: Three times a day (TID) | ORAL | Status: DC | PRN

## 2013-02-06 NOTE — Progress Notes (Signed)
Subjective:    Patient ID: Travis [name], male    DOB: [birth year], 62 y.o.   MRN: [id number]  HPI  Here to f/u -    Pt denies polydipsia, polyuria,  Pt states overall good compliance with meds, trying to follow lower cholesterol diet, wt overall hard to lose.  C/o inablity to get to sleep most nights in the past weeks, asks for sleep med. Denies worsening depressive symptoms, suicidal ideation, or panic; has ongoing anxiety, increased overall recently with mult stressors.     Had renal stone last wk, passed the stone, stopped the flomax   Was trying to walk up to 9 miles per day in [place], back to [place] x 2 wks but had developed and now worsened right foot pain.  Has known ankle djd., bone spur, flat feet and has orthopedic boots that have done quite well, but now with callous and pain worse to the medial right great toe.  Did see podiatry for callous treatments but became expensive at twice per wk.  No willing to restart podiatry tx locally.  Zanaflex does not work well for muscle spasm, needs to go back to flexeril prn but will need prior auth per pt  Needs pain management re-referral, asks to return to prior MD - Dr Delynn Flavin. Not due for pain med until oct 14. Past Medical History  Diagnosis Date  . Impaired glucose tolerance 09/27/2010  . HYPERLIPIDEMIA-MIXED 07/15/2009  . Chronic pain syndrome 10/27/2009  . HYPERTENSION 06/24/2009  . CORONARY ARTERY DISEASE 06/24/2009    a. s/p multiple PCIs - In 2008 he had a Taxus DES to the mild LAD, Endeavor DES to mid LCX and distal LCX. In January 2009 he had DES to distal LCX, mid LCX and proximal LCX. In November 2009 had BMS x 2 to the mid RCA. Cath 10/2011 with patent stents, noncardiac CP. LHC 01/2013: patent stents (noncardiac CP).  [name] URETHRAL STRICTURE 06/24/2009  . DEGENERATIVE JOINT DISEASE 06/24/2009  . SHOULDER PAIN, BILATERAL 06/24/2009  . ANKLE PAIN, RIGHT 06/24/2009  . [place] DISEASE, LUMBAR 04/19/2010  . SCIATICA, LEFT 04/19/2010  .  NEPHROLITHIASIS, HX OF 06/24/2009  . ERECTILE DYSFUNCTION, ORGANIC 05/30/2010  . OSA (obstructive sleep apnea)     not using CPAP very often  . Obesity   . Allergy to clopidogrel   . ALLERGIC RHINITIS 06/24/2009  . Anxiety   . Depression     Prior suicide attempt   Past Surgical History  Procedure Laterality Date  . Tonsillectomy    . Rotator cuff repair      bilateral  . S/p knee replacement  2008  . S/p bilat wrist surgury    . S/p urethral dilations,  multi 2009,2010  . Carpal tunnel release    . S/p right knee arthroscopy  03/2005    and multi prior  . S/p umbilical hernia    . S/p left knee arthroscopy  june 2002    multi prior  . S/p right shoulder rotater cuff surgury  april 2011    Dr. [name] Lighter    reports that he quit smoking about 2 years ago. His smoking use included Cigars. He has never used smokeless tobacco. He reports that  drinks alcohol. He reports that he does not use illicit drugs. family history includes Cancer in his father and mother; Coronary artery disease in his other; Depression in his brother, mother, and other; Diabetes in his father; Early death in his paternal grandfather; Early death (age of onset: 32) in  his maternal grandfather; Heart disease in his father, maternal grandfather, and mother; Hyperlipidemia in his father; Hypertension in his brother, father, mother, and other. Allergies  Allergen Reactions  . Plavix [Clopidogrel Bisulfate]    No current facility-administered medications on file prior to visit.   Current Outpatient Prescriptions on File Prior to Visit  Medication Sig Dispense Refill  . amLODipine (NORVASC) 5 MG tablet TAKE 1 TABLET BY MOUTH EVERY DAY  30 tablet  0  . aspirin 81 MG tablet Take 1 tablet (81 mg total) by mouth daily.  30 tablet  0  . atorvastatin (LIPITOR) 20 MG tablet Take 20 mg by mouth at bedtime.      . benazepril (LOTENSIN) 20 MG tablet Take 20 mg by mouth at bedtime.      [name] buPROPion (WELLBUTRIN XL) 300 MG 24 hr  tablet Take 1 tablet (300 mg total) by mouth daily.  30 tablet  8  . chlorproMAZINE (THORAZINE) 10 MG tablet Take 1 tablet (10 mg total) by mouth at bedtime.  30 tablet  0  . EFFIENT 10 MG TABS TAKE 1 TABLET BY MOUTH DAILY  30 tablet  3  . FLUoxetine (PROZAC) 40 MG capsule Take 1 capsule (40 mg total) by mouth daily.  30 capsule  8  . fluticasone (FLONASE) 50 MCG/ACT nasal spray Place 1 spray into the nose daily.  16 g  0  . oxyCODONE (ROXICODONE) 15 MG immediate release tablet Take 1 tablet (15 mg total) by mouth every 6 (six) hours as needed for pain. With limit 4 per day  120 tablet  0  . tamsulosin (FLOMAX) 0.4 MG CAPS capsule Take 1 capsule (0.4 mg total) by mouth daily.  30 capsule  0    Review of Systems  Constitutional: Negative for unexpected weight change, or unusual diaphoresis  HENT: Negative for tinnitus.   Eyes: Negative for photophobia and visual disturbance.  Respiratory: Negative for choking and stridor.   Gastrointestinal: Negative for vomiting and blood in stool.  Genitourinary: Negative for hematuria and decreased urine volume.  Musculoskeletal: Negative for acute joint swelling Skin: Negative for color change and wound.  Neurological: Negative for tremors and numbness other than noted  Psychiatric/Behavioral: Negative for decreased concentration or  hyperactivity.       Objective:   Physical Exam BP 118/62  Pulse 62  Temp(Src) 99.1 F (37.3 C) (Oral)  Ht 6' 1"  (1.854 m)  Wt 279 lb 4 oz (126.667 kg)  BMI 36.85 kg/m2  SpO2 96% VS noted,  Constitutional: Pt appears well-developed and well-nourished.  HENT: Head: NCAT.  Right Ear: External ear normal.  Left Ear: External ear normal.  Eyes: Conjunctivae and EOM are normal. Pupils are equal, round, and reactive to light.  Neck: Normal range of motion. Neck supple.  Cardiovascular: Normal rate and regular rhythm.   Pulmonary/Chest: Effort normal and breath sounds normal.  Abd:  Soft, NT, non-distended, +  BS Neurological: Pt is alert. Not confused  Skin: Skin is warm. No erythema. Thick callous to right great medial toe without ulcer. Psychiatric: Pt behavior is normal. Thought content normal. 1-2+ nervous    Assessment & Plan:

## 2013-02-06 NOTE — Patient Instructions (Signed)
OK to increase the trazodone to 100 mg at bedtime as needed for sleep OK to increase the klonopin for nerves You will be contacted regarding the referral for: podiatry for the toe You will be contacted regarding the referral for: Pain Management  Please go to the LAB in the Basement (turn left off the elevator) for the tests to be done tomorrow - JUST the A1C  Please remember to sign up for My Chart if you have not done so, as this will be important to you in the future with finding out test results, communicating by private email, and scheduling acute appointments online when needed.  Please return in Nov 2014 as planned, or sooner if needed, with Lab testing done 3-5 days before

## 2013-02-06 NOTE — Telephone Encounter (Signed)
Patient informed.  Call back number [phone]

## 2013-02-07 ENCOUNTER — Emergency Department (HOSPITAL_COMMUNITY): Payer: Medicare HMO

## 2013-02-07 ENCOUNTER — Other Ambulatory Visit (INDEPENDENT_AMBULATORY_CARE_PROVIDER_SITE_OTHER): Payer: Medicare HMO

## 2013-02-07 ENCOUNTER — Encounter (HOSPITAL_COMMUNITY): Payer: Self-pay | Admitting: Unknown Physician Specialty

## 2013-02-07 ENCOUNTER — Inpatient Hospital Stay (HOSPITAL_COMMUNITY)
Admission: EM | Admit: 2013-02-07 | Discharge: 2013-02-10 | DRG: 880 | Disposition: A | Discharge status: Home / Self Care | Payer: Medicare HMO | Attending: Cardiology | Admitting: Cardiology

## 2013-02-07 DIAGNOSIS — Z818 Family history of other mental and behavioral disorders: Secondary | ICD-10-CM

## 2013-02-07 DIAGNOSIS — Z6837 Body mass index (BMI) 37.0-37.9, adult: Secondary | ICD-10-CM

## 2013-02-07 DIAGNOSIS — Z9861 Coronary angioplasty status: Secondary | ICD-10-CM

## 2013-02-07 DIAGNOSIS — R0789 Other chest pain: Secondary | ICD-10-CM | POA: Diagnosis present

## 2013-02-07 DIAGNOSIS — G4733 Obstructive sleep apnea (adult) (pediatric): Secondary | ICD-10-CM | POA: Diagnosis present

## 2013-02-07 DIAGNOSIS — E669 Obesity, unspecified: Secondary | ICD-10-CM | POA: Diagnosis present

## 2013-02-07 DIAGNOSIS — Z96659 Presence of unspecified artificial knee joint: Secondary | ICD-10-CM

## 2013-02-07 DIAGNOSIS — M519 Unspecified thoracic, thoracolumbar and lumbosacral intervertebral disc disorder: Secondary | ICD-10-CM | POA: Diagnosis present

## 2013-02-07 DIAGNOSIS — Z8042 Family history of malignant neoplasm of prostate: Secondary | ICD-10-CM

## 2013-02-07 DIAGNOSIS — N529 Male erectile dysfunction, unspecified: Secondary | ICD-10-CM | POA: Diagnosis present

## 2013-02-07 DIAGNOSIS — M5430 Sciatica, unspecified side: Secondary | ICD-10-CM | POA: Diagnosis present

## 2013-02-07 DIAGNOSIS — F411 Generalized anxiety disorder: Principal | ICD-10-CM | POA: Diagnosis present

## 2013-02-07 DIAGNOSIS — I498 Other specified cardiac arrhythmias: Secondary | ICD-10-CM | POA: Diagnosis present

## 2013-02-07 DIAGNOSIS — G894 Chronic pain syndrome: Secondary | ICD-10-CM | POA: Diagnosis present

## 2013-02-07 DIAGNOSIS — N35919 Unspecified urethral stricture, male, unspecified site: Secondary | ICD-10-CM | POA: Diagnosis present

## 2013-02-07 DIAGNOSIS — Z808 Family history of malignant neoplasm of other organs or systems: Secondary | ICD-10-CM

## 2013-02-07 DIAGNOSIS — F329 Major depressive disorder, single episode, unspecified: Secondary | ICD-10-CM | POA: Diagnosis present

## 2013-02-07 DIAGNOSIS — Z7982 Long term (current) use of aspirin: Secondary | ICD-10-CM

## 2013-02-07 DIAGNOSIS — J309 Allergic rhinitis, unspecified: Secondary | ICD-10-CM | POA: Diagnosis present

## 2013-02-07 DIAGNOSIS — Z8249 Family history of ischemic heart disease and other diseases of the circulatory system: Secondary | ICD-10-CM

## 2013-02-07 DIAGNOSIS — E782 Mixed hyperlipidemia: Secondary | ICD-10-CM | POA: Diagnosis present

## 2013-02-07 DIAGNOSIS — I2510 Atherosclerotic heart disease of native coronary artery without angina pectoris: Secondary | ICD-10-CM | POA: Diagnosis present

## 2013-02-07 DIAGNOSIS — M1990 Unspecified osteoarthritis, unspecified site: Secondary | ICD-10-CM | POA: Diagnosis present

## 2013-02-07 DIAGNOSIS — Z79899 Other long term (current) drug therapy: Secondary | ICD-10-CM

## 2013-02-07 DIAGNOSIS — F3289 Other specified depressive episodes: Secondary | ICD-10-CM | POA: Diagnosis present

## 2013-02-07 DIAGNOSIS — I10 Essential (primary) hypertension: Secondary | ICD-10-CM | POA: Diagnosis present

## 2013-02-07 DIAGNOSIS — Z803 Family history of malignant neoplasm of breast: Secondary | ICD-10-CM

## 2013-02-07 DIAGNOSIS — Z833 Family history of diabetes mellitus: Secondary | ICD-10-CM

## 2013-02-07 DIAGNOSIS — Z87891 Personal history of nicotine dependence: Secondary | ICD-10-CM

## 2013-02-07 DIAGNOSIS — Z7902 Long term (current) use of antithrombotics/antiplatelets: Secondary | ICD-10-CM

## 2013-02-07 DIAGNOSIS — Z23 Encounter for immunization: Secondary | ICD-10-CM

## 2013-02-07 DIAGNOSIS — IMO0001 Reserved for inherently not codable concepts without codable children: Secondary | ICD-10-CM

## 2013-02-07 DIAGNOSIS — I200 Unstable angina: Secondary | ICD-10-CM

## 2013-02-07 LAB — LIPASE, BLOOD: Lipase: 16 U/L (ref 11–59)

## 2013-02-07 LAB — HEPATIC FUNCTION PANEL
AST: 18 U/L (ref 0–37)
Albumin: 3.7 g/dL (ref 3.5–5.2)
Bilirubin, Direct: 0.1 mg/dL (ref 0.0–0.3)
Total Bilirubin: 0.4 mg/dL (ref 0.3–1.2)
Total Protein: 6.3 g/dL (ref 6.0–8.3)

## 2013-02-07 LAB — CBC WITH DIFFERENTIAL/PLATELET
Eosinophils Absolute: 0 10*3/uL (ref 0.0–0.7)
Hemoglobin: 12.8 g/dL — ABNORMAL LOW (ref 13.0–17.0)
Lymphocytes Relative: 15 % (ref 12–46)
Lymphs Abs: 0.8 10*3/uL (ref 0.7–4.0)
MCH: 31.7 pg (ref 26.0–34.0)
Neutro Abs: 4 10*3/uL (ref 1.7–7.7)
Neutrophils Relative %: 78 % — ABNORMAL HIGH (ref 43–77)
Platelets: 251 10*3/uL (ref 150–400)
RBC: 4.04 MIL/uL — ABNORMAL LOW (ref 4.22–5.81)
WBC: 5.2 10*3/uL (ref 4.0–10.5)

## 2013-02-07 LAB — PROTIME-INR: Prothrombin Time: 14.6 seconds (ref 11.6–15.2)

## 2013-02-07 LAB — POCT I-STAT, CHEM 8
Chloride: 106 mEq/L (ref 96–112)
Creatinine, Ser: 1 mg/dL (ref 0.50–1.35)
Hemoglobin: 12.6 g/dL — ABNORMAL LOW (ref 13.0–17.0)
Potassium: 4.5 mEq/L (ref 3.5–5.1)
Sodium: 142 mEq/L (ref 135–145)

## 2013-02-07 LAB — GLUCOSE, CAPILLARY: Glucose-Capillary: 151 mg/dL — ABNORMAL HIGH (ref 70–99)

## 2013-02-07 LAB — TROPONIN I: Troponin I: <0.3 ng/mL (ref <0.30)

## 2013-02-07 LAB — HEMOGLOBIN A1C: Hgb A1c MFr Bld: 5.9 % (ref 4.6–6.5)

## 2013-02-07 LAB — POCT I-STAT TROPONIN I

## 2013-02-07 LAB — APTT: aPTT: 31 seconds (ref 24–37)

## 2013-02-07 MED ORDER — HEPARIN BOLUS VIA INFUSION
4000.0000 [IU] | Freq: Once | INTRAVENOUS | Status: AC
  Administered 2013-02-07: 4000 [IU] via INTRAVENOUS
  Filled 2013-02-07: qty 4000

## 2013-02-07 MED ORDER — SODIUM CHLORIDE 0.9 % IJ SOLN: 3.0000 mL | INTRAMUSCULAR | Status: DC | PRN

## 2013-02-07 MED ORDER — LIDOCAINE HCL (PF) 1 % IJ SOLN
INTRAMUSCULAR | Status: AC
  Filled 2013-02-07: qty 5

## 2013-02-07 MED ORDER — BENAZEPRIL HCL 20 MG PO TABS
20.0000 mg | ORAL_TABLET | Freq: Every day | ORAL | Status: DC
  Administered 2013-02-07 – 2013-02-09 (×3): 20 mg via ORAL
  Filled 2013-02-07 (×4): qty 1

## 2013-02-07 MED ORDER — OXYCODONE-ACETAMINOPHEN 5-325 MG PO TABS
2.0000 | ORAL_TABLET | Freq: Once | ORAL | Status: AC
  Administered 2013-02-07: 2 via ORAL
  Filled 2013-02-07: qty 2

## 2013-02-07 MED ORDER — LORAZEPAM 2 MG/ML IJ SOLN
INTRAMUSCULAR | Status: AC
  Filled 2013-02-07: qty 1

## 2013-02-07 MED ORDER — ASPIRIN 81 MG PO CHEW
81.0000 mg | CHEWABLE_TABLET | Freq: Every day | ORAL | Status: DC
  Administered 2013-02-08 – 2013-02-09 (×2): 81 mg via ORAL
  Filled 2013-02-07 (×2): qty 1

## 2013-02-07 MED ORDER — INSULIN ASPART 100 UNIT/ML ~~LOC~~ SOLN
0.0000 [IU] | Freq: Three times a day (TID) | SUBCUTANEOUS | Status: DC
  Administered 2013-02-08: 1 [IU] via SUBCUTANEOUS

## 2013-02-07 MED ORDER — FLUTICASONE PROPIONATE 50 MCG/ACT NA SUSP
1.0000 | Freq: Every day | NASAL | Status: DC
  Administered 2013-02-07 – 2013-02-09 (×3): 1 via NASAL
  Filled 2013-02-07: qty 16

## 2013-02-07 MED ORDER — ASPIRIN 81 MG PO CHEW
324.0000 mg | CHEWABLE_TABLET | ORAL | Status: AC
  Administered 2013-02-07: 324 mg via ORAL
  Filled 2013-02-07: qty 4

## 2013-02-07 MED ORDER — PRASUGREL HCL 10 MG PO TABS
10.0000 mg | ORAL_TABLET | Freq: Every day | ORAL | Status: DC
Start: 2013-02-07 — End: 2013-02-10
  Administered 2013-02-07 – 2013-02-10 (×4): 10 mg via ORAL
  Filled 2013-02-07 (×4): qty 1

## 2013-02-07 MED ORDER — OXYCODONE HCL 5 MG PO TABS
15.0000 mg | ORAL_TABLET | Freq: Four times a day (QID) | ORAL | Status: DC | PRN
  Administered 2013-02-07 – 2013-02-10 (×9): 15 mg via ORAL
  Filled 2013-02-07 (×9): qty 3

## 2013-02-07 MED ORDER — BUPROPION HCL ER (XL) 300 MG PO TB24
300.0000 mg | ORAL_TABLET | Freq: Every day | ORAL | Status: DC
  Administered 2013-02-07 – 2013-02-10 (×4): 300 mg via ORAL
  Filled 2013-02-07 (×4): qty 1

## 2013-02-07 MED ORDER — FLUOXETINE HCL 20 MG PO CAPS
40.0000 mg | ORAL_CAPSULE | Freq: Every day | ORAL | Status: DC
  Administered 2013-02-08 – 2013-02-10 (×3): 40 mg via ORAL
  Filled 2013-02-07 (×4): qty 2

## 2013-02-07 MED ORDER — NITROGLYCERIN 0.4 MG SL SUBL
0.4000 mg | SUBLINGUAL_TABLET | SUBLINGUAL | Status: DC | PRN
  Administered 2013-02-08 (×2): 0.4 mg via SUBLINGUAL
  Filled 2013-02-07: qty 25

## 2013-02-07 MED ORDER — ATORVASTATIN CALCIUM 20 MG PO TABS
20.0000 mg | ORAL_TABLET | Freq: Every day | ORAL | Status: DC
  Administered 2013-02-07 – 2013-02-09 (×3): 20 mg via ORAL
  Filled 2013-02-07 (×4): qty 1

## 2013-02-07 MED ORDER — TRAZODONE HCL 100 MG PO TABS
100.0000 mg | ORAL_TABLET | Freq: Every day | ORAL | Status: DC
  Administered 2013-02-07 – 2013-02-09 (×3): 100 mg via ORAL
  Filled 2013-02-07 (×4): qty 1

## 2013-02-07 MED ORDER — SODIUM CHLORIDE 0.9 % IJ SOLN
3.0000 mL | Freq: Two times a day (BID) | INTRAMUSCULAR | Status: DC
  Administered 2013-02-08 – 2013-02-09 (×2): 3 mL via INTRAVENOUS

## 2013-02-07 MED ORDER — AMLODIPINE BESYLATE 5 MG PO TABS
5.0000 mg | ORAL_TABLET | Freq: Every day | ORAL | Status: DC
  Administered 2013-02-07 – 2013-02-10 (×4): 5 mg via ORAL
  Filled 2013-02-07 (×4): qty 1

## 2013-02-07 MED ORDER — ONDANSETRON HCL 4 MG/2ML IJ SOLN: 4.0000 mg | Freq: Four times a day (QID) | INTRAMUSCULAR | Status: DC | PRN

## 2013-02-07 MED ORDER — NITROGLYCERIN IN D5W 200-5 MCG/ML-% IV SOLN: 5.0000 ug/min | INTRAVENOUS | Status: DC

## 2013-02-07 MED ORDER — CLONAZEPAM 0.5 MG PO TABS
1.0000 mg | ORAL_TABLET | Freq: Three times a day (TID) | ORAL | Status: DC | PRN
  Administered 2013-02-07 – 2013-02-10 (×5): 1 mg via ORAL
  Filled 2013-02-07 (×6): qty 1

## 2013-02-07 MED ORDER — CYCLOBENZAPRINE HCL 10 MG PO TABS
5.0000 mg | ORAL_TABLET | Freq: Three times a day (TID) | ORAL | Status: DC | PRN
  Administered 2013-02-08 – 2013-02-10 (×4): 5 mg via ORAL
  Filled 2013-02-07 (×4): qty 1

## 2013-02-07 MED ORDER — CHLORPROMAZINE HCL 10 MG PO TABS
10.0000 mg | ORAL_TABLET | Freq: Every day | ORAL | Status: DC
  Administered 2013-02-07 – 2013-02-09 (×3): 10 mg via ORAL
  Filled 2013-02-07 (×5): qty 1

## 2013-02-07 MED ORDER — SODIUM CHLORIDE 0.9 % IV SOLN
250.0000 mL | INTRAVENOUS | Status: DC | PRN
  Administered 2013-02-07: 500 mL via INTRAVENOUS

## 2013-02-07 MED ORDER — TAMSULOSIN HCL 0.4 MG PO CAPS
0.4000 mg | ORAL_CAPSULE | Freq: Every day | ORAL | Status: DC
  Filled 2013-02-07: qty 1

## 2013-02-07 MED ORDER — LORAZEPAM 2 MG/ML IJ SOLN
0.5000 mg | Freq: Once | INTRAMUSCULAR | Status: AC
  Administered 2013-02-07: 0.5 mg via INTRAVENOUS
  Filled 2013-02-07: qty 1

## 2013-02-07 MED ORDER — GABAPENTIN 400 MG PO CAPS
400.0000 mg | ORAL_CAPSULE | Freq: Two times a day (BID) | ORAL | Status: DC
  Administered 2013-02-07 – 2013-02-08 (×2): 400 mg via ORAL
  Filled 2013-02-07 (×3): qty 1

## 2013-02-07 MED ORDER — HEPARIN (PORCINE) IN NACL 100-0.45 UNIT/ML-% IJ SOLN
1800.0000 [IU]/h | INTRAMUSCULAR | Status: DC
  Administered 2013-02-07: 1500 [IU]/h via INTRAVENOUS
  Filled 2013-02-07 (×2): qty 250

## 2013-02-07 MED ORDER — ACETAMINOPHEN 325 MG PO TABS
650.0000 mg | ORAL_TABLET | ORAL | Status: DC | PRN
  Administered 2013-02-08 (×2): 650 mg via ORAL
  Filled 2013-02-07 (×2): qty 2

## 2013-02-07 MED ORDER — CYCLOBENZAPRINE HCL 10 MG PO TABS
5.0000 mg | ORAL_TABLET | Freq: Once | ORAL | Status: AC
  Administered 2013-02-07: 5 mg via ORAL
  Filled 2013-02-07: qty 1

## 2013-02-07 MED ORDER — NITROGLYCERIN IN D5W 200-5 MCG/ML-% IV SOLN
5.0000 ug/min | INTRAVENOUS | Status: DC
  Administered 2013-02-07: 10 ug/min via INTRAVENOUS
  Administered 2013-02-07: 15 ug/min via INTRAVENOUS
  Filled 2013-02-07: qty 250

## 2013-02-07 MED ORDER — ZOLPIDEM TARTRATE 5 MG PO TABS
10.0000 mg | ORAL_TABLET | Freq: Every evening | ORAL | Status: DC | PRN
  Administered 2013-02-07 – 2013-02-09 (×3): 10 mg via ORAL
  Filled 2013-02-07 (×3): qty 2

## 2013-02-07 NOTE — ED Notes (Signed)
Unable to place peripheral IV but labs drawn.  While drawing blood, pt asks "ask the doctor if they can admit me because I don't want to go to jail".

## 2013-02-07 NOTE — ED Notes (Signed)
Pt presents with onset of R sided chest pain while being arrested today.  Pt reports pain is intermittent x 30 minutes.  Pt reports shortness of breath, diaphoresis and nausea.  Pt seen at [facility] on Monday for kidney stone, had labs drawn this morning.  Pt reports he has not taken any of his medication today, only complains of back pain.

## 2013-02-07 NOTE — ED Notes (Addendum)
Patient arrived via [facility] from the jail house with substernal intermittent chest pain that started while in custody. Patient has a cardiac history. VSS, EKG NSR. Police Offical is at bedside.

## 2013-02-07 NOTE — ED Provider Notes (Signed)
CSN: [id number]     Arrival date & time 02/07/13  1419 History   First MD Initiated Contact with Patient 02/07/13 1438     Chief Complaint  Patient presents with  . Chest Pain   (Consider location/radiation/quality/duration/timing/severity/associated sxs/prior Treatment) HPI Complains of anterior chest pain right sided parasternal typical of angina he had in the past brought on by stress when he was taken into police custody. Treated with aspirin in the field states pain is intermittent and lasts for 5 minutes at a time. He is presently pain-free. Patient states he gets angina approximately once per week uses nitroglycerin approximately twice per month. Past Medical History  Diagnosis Date  . Impaired glucose tolerance 09/27/2010  . HYPERLIPIDEMIA-MIXED 07/15/2009  . DEPRESSION 06/24/2009  . Chronic pain syndrome 10/27/2009  . HYPERTENSION 06/24/2009  . CORONARY ARTERY DISEASE 06/24/2009    s/p multiple PCIs  . URETHRAL STRICTURE 06/24/2009  . DEGENERATIVE JOINT DISEASE 06/24/2009  . SHOULDER PAIN, BILATERAL 06/24/2009  . ANKLE PAIN, RIGHT 06/24/2009  . [place] DISEASE, LUMBAR 04/19/2010  . SCIATICA, LEFT 04/19/2010  . FATIGUE 06/24/2009  . NEPHROLITHIASIS, HX OF 06/24/2009  . ERECTILE DYSFUNCTION, ORGANIC 05/30/2010  . OSA (obstructive sleep apnea)     not using CPAP very often  . Obesity   . Allergy to clopidogrel   . ALLERGIC RHINITIS 06/24/2009  . Anxiety 11/12/2011  . Anxiety   . Depression    Past Surgical History  Procedure Laterality Date  . Tonsillectomy    . Rotator cuff repair      bilateral  . S/p knee replacement  2008  . S/p bilat wrist surgury    . S/p urethral dilations,  multi 2009,2010  . Carpal tunnel release    . S/p right knee arthroscopy  03/2005    and multi prior  . S/p umbilical hernia    . S/p left knee arthroscopy  june 2002    multi prior  . S/p right shoulder rotater cuff surgury  april 2011    Dr. [name] Lighter   Family History  Problem Relation Age of Onset  .  Depression Mother   . Heart disease Mother   . Hypertension Mother   . Cancer Mother     Breast  . Diabetes Father   . Heart disease Father     CABG  . Cancer Father     prostate and skin cancer  . Hypertension Father   . Hyperlipidemia Father   . Depression Brother     x 2  . Hypertension Brother     x2  . Coronary artery disease Other   . Hypertension Other   . Depression Other   . Heart disease Maternal Grandfather   . Early death Maternal Grandfather 76    heart attack  . Early death Paternal Grandfather    History  Substance Use Topics  . Smoking status: Former Smoker    Types: Cigars    Quit date: 08/28/2010  . Smokeless tobacco: Never Used     Comment: rare  . Alcohol Use: 0.0 oz/week     Comment: occasional    Review of Systems  Constitutional: Negative.   HENT: Negative.   Respiratory: Negative.   Cardiovascular: Positive for chest pain.  Gastrointestinal: Negative.   Musculoskeletal: Positive for back pain.       Chronic back pain  Skin: Negative.   Neurological: Negative.   Psychiatric/Behavioral:       Anxiety  All other systems reviewed and are negative.  Allergies  Review of patient's allergies indicates no known allergies.  Home Medications   Current Outpatient Rx  Name  Route  Sig  Dispense  Refill  . amLODipine (NORVASC) 5 MG tablet      TAKE 1 TABLET BY MOUTH EVERY DAY   30 tablet   0     Patient needs to schedule appointment for further  ...   . aspirin 81 MG tablet   Oral   Take 1 tablet (81 mg total) by mouth daily.   30 tablet   0   . atorvastatin (LIPITOR) 20 MG tablet   Oral   Take 20 mg by mouth at bedtime.         . benazepril (LOTENSIN) 20 MG tablet   Oral   Take 20 mg by mouth at bedtime.         [name] buPROPion (WELLBUTRIN XL) 300 MG 24 hr tablet   Oral   Take 1 tablet (300 mg total) by mouth daily.   30 tablet   8   . chlorproMAZINE (THORAZINE) 10 MG tablet   Oral   Take 1 tablet (10 mg total) by  mouth at bedtime.   30 tablet   0   . clonazePAM (KLONOPIN) 1 MG tablet   Oral   Take 1 tablet (1 mg total) by mouth 3 (three) times daily as needed for anxiety.   90 tablet   2   . cyclobenzaprine (FLEXERIL) 5 MG tablet   Oral   Take 1 tablet (5 mg total) by mouth 3 (three) times daily as needed for muscle spasms.   90 tablet   2   . EFFIENT 10 MG TABS      TAKE 1 TABLET BY MOUTH DAILY   30 tablet   3   . FLUoxetine (PROZAC) 40 MG capsule   Oral   Take 1 capsule (40 mg total) by mouth daily.   30 capsule   8   . fluticasone (FLONASE) 50 MCG/ACT nasal spray   Nasal   Place 1 spray into the nose daily.   16 g   0   . gabapentin (NEURONTIN) 400 MG capsule   Oral   Take 1 capsule (400 mg total) by mouth 2 (two) times daily.   60 capsule   0   . ibuprofen (ADVIL,MOTRIN) 800 MG tablet   Oral   Take 1 tablet (800 mg total) by mouth 3 (three) times daily.   21 tablet   0   . indomethacin (INDOCIN) 25 MG capsule      TAKE ONE CAPSULE BY MOUTH FOUR TIMES DAILY   360 capsule   2     **Patient requests 90 days supply**   . isosorbide mononitrate (IMDUR) 60 MG 24 hr tablet      TAKE 1 AND 1/2 TABLETS BY MOUTH DAILY   45 tablet   1     .Marland KitchenPatient needs to contact office to schedule  App ...   . metFORMIN (GLUCOPHAGE) 500 MG tablet   Oral   Take 500 mg by mouth 2 (two) times daily with a meal.         . oxyCODONE (ROXICODONE) 15 MG immediate release tablet   Oral   Take 1 tablet (15 mg total) by mouth every 6 (six) hours as needed for pain. With limit 4 per day   120 tablet   0   . tamsulosin (FLOMAX) 0.4 MG CAPS capsule   Oral   Take  1 capsule (0.4 mg total) by mouth daily.   30 capsule   0   . traZODone (DESYREL) 50 MG tablet   Oral   Take 2 tablets (100 mg total) by mouth at bedtime.   60 tablet   5   . zolpidem (AMBIEN) 10 MG tablet      2 tabs by mouth at bedtime as needed for sleep   60 tablet   2    BP 108/63  Pulse 68  Temp(Src)  99.6 F (37.6 C) (Oral)  Resp 20  SpO2 97% Physical Exam  Nursing note and vitals reviewed. Constitutional: He appears well-developed and well-nourished.  HENT:  Head: Normocephalic and atraumatic.  Eyes: Conjunctivae are normal. Pupils are equal, round, and reactive to light.  Neck: Neck supple. No tracheal deviation present. No thyromegaly present.  Cardiovascular: Normal rate and regular rhythm.   No murmur heard. Pulmonary/Chest: Effort normal and breath sounds normal.  Abdominal: Soft. Bowel sounds are normal. He exhibits no distension. There is no tenderness.  Musculoskeletal: Normal range of motion. He exhibits no edema and no tenderness.  Neurological: He is alert. Coordination normal.  Skin: Skin is warm and dry. No rash noted.  Psychiatric: He has a normal mood and affect.    ED Course  Procedures (including critical care time) Labs Review Labs Reviewed - No data to display Imaging Review No results found.  Date: 02/07/2013  1427 pm  Rate: 60  Rhythm: normal sinus rhythm  QRS Axis: normal  Intervals: normal  ST/T Wave abnormalities: normal  Conduction Disutrbances: none  Narrative Interpretation: unremarkable    At 1450 p.m. patient complained of recurrent chest pain. Lasted 3 or 4 minutes. Resolved spontaneously.  Date: 02/07/2013  1451  Rate: 70  RNitroglycerinhythm: normal sinus rhythm  QRSFlexeril Axis: normal  Intervals: normal  ST/T Wave abnormalities: normal  Conduction Disutrbances: none  Narrative Interpretation: unremarkable  nitoglycein iv dtrip ordered.  Percocet, flexeruil ordered fro chronic back pain Nursing was unable to establish IV access. I established a right-sided external jugular peripheral IV Angiocath insertion Performed by: Orlie Dakin  Consent: Verbal consent obtained. Risks and benefits: risks, benefits and alternatives were discussed Time out: Immediately prior to procedure a "time out" was called to verify the correct  patient, procedure, equipment, support staff and site/side marked as required.  Preparation: Patient was prepped and draped in the usual sterile fashion.  Vein Location: right external jugular    Gauge: 20  Normal blood return and flush without difficulty  Patient tolerated procedure well   4:35 PM pain improved after treatment with intravenous nitroglycerin drip, titrated to pain. He also received Ativan 0.5 mg IV for anxiety. 4:35 PM he is resting comfortably.   Results for orders placed during the hospital encounter of 02/07/13  CBC WITH DIFFERENTIAL      Result Value Range   WBC 5.2  4.0 - 10.5 K/uL   RBC 4.04 (*) 4.22 - 5.81 MIL/uL   Hemoglobin 12.8 (*) 13.0 - 17.0 g/dL   HCT 36.8 (*) 39.0 - 52.0 %   MCV 91.1  78.0 - 100.0 fL   MCH 31.7  26.0 - 34.0 pg   MCHC 34.8  30.0 - 36.0 g/dL   RDW 13.4  11.5 - 15.5 %   Platelets 251  150 - 400 K/uL   Neutrophils Relative % 78 (*) 43 - 77 %   Neutro Abs 4.0  1.7 - 7.7 K/uL   Lymphocytes Relative 15  12 - 46 %  Lymphs Abs 0.8  0.7 - 4.0 K/uL   Monocytes Relative 6  3 - 12 %   Monocytes Absolute 0.3  0.1 - 1.0 K/uL   Eosinophils Relative 1  0 - 5 %   Eosinophils Absolute 0.0  0.0 - 0.7 K/uL   Basophils Relative 0  0 - 1 %   Basophils Absolute 0.0  0.0 - 0.1 K/uL  POCT I-STAT, CHEM 8      Result Value Range   Sodium 142  135 - 145 mEq/L   Potassium 4.5  3.5 - 5.1 mEq/L   Chloride 106  96 - 112 mEq/L   BUN 19  6 - 23 mg/dL   Creatinine, Ser 1.00  0.50 - 1.35 mg/dL   Glucose, Bld 102 (*) 70 - 99 mg/dL   Calcium, Ion 1.15  1.13 - 1.30 mmol/L   TCO2 25  0 - 100 mmol/L   Hemoglobin 12.6 (*) 13.0 - 17.0 g/dL   HCT 37.0 (*) 39.0 - 52.0 %  POCT I-STAT TROPONIN I      Result Value Range   Troponin i, poc 0.00  0.00 - 0.08 ng/mL   Comment 3            Dg Chest 2 View  02/07/2013   CLINICAL DATA:  Chest pain, shortness of breast, coronary stents  EXAM: CHEST  2 VIEW  COMPARISON:  CTA chest dated 01/30/2013  FINDINGS: Lungs are  clear. No pleural effusion or pneumothorax.  The heart is normal in size. Coronary stents.  Degenerative changes of the visualized thoracolumbar spine.  IMPRESSION: No evidence of acute cardiopulmonary disease.   Electronically Signed   By: Julian Hy M.D.   On: 02/07/2013 17:59   Ct Angio Abdomen W/cm &/or Wo Contrast  01/30/2013   CLINICAL DATA:  Severe back pain, vomiting.  EXAM: CT ANGIOGRAPHY CHEST, ABDOMEN AND PELVIS  TECHNIQUE: Multidetector CT imaging through the chest, abdomen and pelvis was performed using the standard protocol during bolus administration of intravenous contrast. Multiplanar reconstructed images including MIPs were obtained and reviewed to evaluate the vascular anatomy.  CONTRAST:  185m OMNIPAQUE IOHEXOL 350 MG/ML SOLN  COMPARISON:  None.  FINDINGS: CTA CHEST FINDINGS  Heart is normal size. Aorta is normal caliber. No evidence of aortic dissection. Densely and diffusely calcified CORONARY ARTERIES. While not optimally performed to assess for pulmonary emboli, no large or moderate-sized filling defect in the pulmonary arteries to suggest significant pulmonary emboli. No mediastinal, hilar, or axillary adenopathy. Visualized thyroid and chest wall soft tissues unremarkable. I would Lungs are clear. No focal airspace opacitiesor suspicious nodules. No effusions.  Review of the MIP images confirms the above findings.  CTA ABDOMEN AND PELVIS FINDINGS  Aorta is normal caliber. No dissection. Mesenteric vessels and renal arteries widely patent. The iliofemoral vessels demonstrate scattered calcifications without aneurysm or dissection.  There is mild right hydronephrosis and perinephric stranding. 5-6 mm stone at the right ureterovesical junction. Urinary bladder is decompressed. No renal stones bilaterally.  Liver, spleen, pancreas and adrenals are unremarkable. A small amount of layering high density material in the gallbladder, likely small stones.  Bowel grossly unremarkable.  No  free fluid, free air, or adenopathy.  Right inguinal hernia containing fat.  Degenerative changes throughout the thoracolumbar spine. No acute bony abnormality.  Review of the MIP images confirms the above findings.  IMPRESSION: CTA CHEST IMPRESSION  No evidence of aortic aneurysm or significant pulmonary embolus.  Severe diffuse coronary artery calcifications.  CTA  ABDOMEN AND PELVIS IMPRESSION  5-6 mm obstructing right UVJ stone with mild right hydronephrosis and perinephric stranding.  No evidence of aortic aneurysm or dissection.  Cholelithiasis.   Electronically Signed   By: Rolm Baptise M.D.   On: 01/30/2013 15:48   Ct Angio Chest Aortic Dissect W &/or W/o  01/30/2013   CLINICAL DATA:  Severe back pain, vomiting.  EXAM: CT ANGIOGRAPHY CHEST, ABDOMEN AND PELVIS  TECHNIQUE: Multidetector CT imaging through the chest, abdomen and pelvis was performed using the standard protocol during bolus administration of intravenous contrast. Multiplanar reconstructed images including MIPs were obtained and reviewed to evaluate the vascular anatomy.  CONTRAST:  151m OMNIPAQUE IOHEXOL 350 MG/ML SOLN  COMPARISON:  None.  FINDINGS: CTA CHEST FINDINGS  Heart is normal size. Aorta is normal caliber. No evidence of aortic dissection. Densely and diffusely calcified CORONARY ARTERIES. While not optimally performed to assess for pulmonary emboli, no large or moderate-sized filling defect in the pulmonary arteries to suggest significant pulmonary emboli. No mediastinal, hilar, or axillary adenopathy. Visualized thyroid and chest wall soft tissues unremarkable. I would Lungs are clear. No focal airspace opacitiesor suspicious nodules. No effusions.  Review of the MIP images confirms the above findings.  CTA ABDOMEN AND PELVIS FINDINGS  Aorta is normal caliber. No dissection. Mesenteric vessels and renal arteries widely patent. The iliofemoral vessels demonstrate scattered calcifications without aneurysm or dissection.  There is  mild right hydronephrosis and perinephric stranding. 5-6 mm stone at the right ureterovesical junction. Urinary bladder is decompressed. No renal stones bilaterally.  Liver, spleen, pancreas and adrenals are unremarkable. A small amount of layering high density material in the gallbladder, likely small stones.  Bowel grossly unremarkable.  No free fluid, free air, or adenopathy.  Right inguinal hernia containing fat.  Degenerative changes throughout the thoracolumbar spine. No acute bony abnormality.  Review of the MIP images confirms the above findings.  IMPRESSION: CTA CHEST IMPRESSION  No evidence of aortic aneurysm or significant pulmonary embolus.  Severe diffuse coronary artery calcifications.  CTA ABDOMEN AND PELVIS IMPRESSION  5-6 mm obstructing right UVJ stone with mild right hydronephrosis and perinephric stranding.  No evidence of aortic aneurysm or dissection.  Cholelithiasis.   Electronically Signed   By: KRolm BaptiseM.D.   On: 01/30/2013 15:48     MDM  No diagnosis found. Spoke with Dr.Mclean plan 23 hour observation step down unit . Intravenous nitroglycerin drip intravenous heparin  Diagnosis unstable angina CRITICAL CARE Performed by: JOrlie DakinTotal critical care time: 30 minute Critical care time was exclusive of separately billable procedures and treating other patients. Critical care was necessary to treat or prevent imminent or life-threatening deterioration. Critical care was time spent personally by me on the following activities: development of treatment plan with patient and/or surrogate as well as nursing, discussions with consultants, evaluation of patient's response to treatment, examination of patient, obtaining history from patient or surrogate, ordering and performing treatments and interventions, ordering and review of laboratory studies, ordering and review of radiographic studies, pulse oximetry and re-evaluation of patient's condition.   SOrlie Dakin  MD 02/07/13 [phone]

## 2013-02-07 NOTE — ED Notes (Signed)
Cardiology paged again.

## 2013-02-07 NOTE — ED Notes (Signed)
Attempted multiple times to access an iv site on patient. EDP advised.

## 2013-02-07 NOTE — H&P (Signed)
History and Physical  Patient ID: Travis [name] MRN: [id number], DOB: [birth year] Date of Encounter: 02/07/2013, 6:44 PM Primary Physician: [name], MD Primary Cardiologist: Aundra Dubin  Chief Complaint: CP Reason for Admission: CP  HPI: [name] is a 62 y/o M with history of CAD (multiple PCIs - 8 stents - last cath 10/2011 with patent stents, noncardiac CP), HTN, HL, anxiety/depression with prior suicide attempt, DDD followed by pain management who presented to [facility] today with chest pain while being arrested. He was in the holding cell and developed intermittent R sided chest pain like someone was stepping on/off his chest. This was associated with jaw pain and diaphoresis. He felt very anxious. The pain resolved but then recurred on the L and central chest in the ER. This has been intermittent without a real pattern, has lasted minutes at a time. He had nausea once and SOB twice. IV NTG has not helped. He did not receive SL NTG. He received Percocet in the ED without improvement. He also received Flexeril and Ativan. With each recurrence of CP, ECG remained unchanged. He reports being told recently in [facility] that he needs a heart cath. He can walk up to 9 miles without CP. This pain feels similar to prior heart pain. CXR nonacute. Labs stable. Troponin neg x 1. VSS.  Past Medical History  Diagnosis Date  . Impaired glucose tolerance 09/27/2010  . HYPERLIPIDEMIA-MIXED 07/15/2009  . Chronic pain syndrome 10/27/2009  . HYPERTENSION 06/24/2009  . CORONARY ARTERY DISEASE 06/24/2009    s/p multiple PCIs.  In 2008 he had a Taxus DES to the mild LAD, Endeavor DES to mid LCX and distal LCX. In January 2009 he had DES to distal LCX, mid LCX and proximal LCX. In November 2009 had BMS x 2 to the mid RCA. Cath 10/2011 with patent stents, noncardiac CP.  [name] URETHRAL STRICTURE 06/24/2009  . DEGENERATIVE JOINT DISEASE 06/24/2009  . SHOULDER PAIN, BILATERAL 06/24/2009  . ANKLE PAIN, RIGHT 06/24/2009  . [place]  DISEASE, LUMBAR 04/19/2010  . SCIATICA, LEFT 04/19/2010  . NEPHROLITHIASIS, HX OF 06/24/2009  . ERECTILE DYSFUNCTION, ORGANIC 05/30/2010  . OSA (obstructive sleep apnea)     not using CPAP very often  . Obesity   . Allergy to clopidogrel   . ALLERGIC RHINITIS 06/24/2009  . Anxiety   . Depression     Prior suicide attempt     Most Recent Cardiac Studies: 2D Echo 07/2009 Left ventricle: The cavity size was normal. There was mild concentric hypertrophy. Systolic function was normal. The estimated ejection fraction was in the range of 55% to 60%. Wall motion was normal; there were no regional wall motion abnormalities. Doppler parameters are consistent with abnormal left ventricular relaxation (grade 1 diastolic dysfunction).   Cardiac Cath 10/2011 Procedure Performed:  1. Left Heart Catheterization 2. Selective Coronary Angiography 3. Left ventricular angiogram  Operator: Lauree Chandler, MD  Arterial access site: Right radial artery.  Indication: Chest pain in a patient with known CAD with multiple stenting procedures in the past in [place]. No recent cardiac workup.  Procedure Details:  The risks, benefits, complications, treatment options, and expected outcomes were discussed with the patient. The patient and/or family concurred with the proposed plan, giving informed consent. The patient was brought to the cath lab after IV hydration was begun and oral premedication was given. The patient was further sedated with Versed and Fentanyl. The right wrist was assessed with an Allens test which was positive. The right wrist was  prepped and draped in a sterile fashion. 1% lidocaine was used for local anesthesia. Using the modified Seldinger access technique, a 5 French sheath was placed in the right radial artery. 2.5 mg Nicardipine was given through the sheath. 6000 units IV heparin was given. Standard diagnostic catheters were used to perform selective coronary angiography. A pigtail catheter  was used to perform a left ventricular angiogram. The sheath was removed from the right radial artery and a Terumo hemostasis band was applied at the arteriotomy site on the right wrist.  There were no immediate complications. The patient was taken to the recovery area in stable condition.  Hemodynamic Findings:  Central aortic pressure: 99/49  Left ventricular pressure: 99/12/20  Angiographic Findings:  Left main: No obstructive disease.  Left Anterior Descending Artery: Large vessel that courses to the apex. There is a patent stent in the mid vessel. There are minor luminal irregularities in the mid and distal vessel.  Circumflex Artery: Moderate sized vessel with patents stents in the proximal and mid vessel and in the distal OM branch. No obstructive disease noted.  Right Coronary Artery: Moderate sized dominant vessel with patent proximal stents. No restenosis. Mild plaque in the mid vessel.  Left Ventricular Angiogram: LVEF 55%.  Impression:  1. Triple vessel CAD with patent stents in the RCA, LAD and Circumflex  2. Normal LV systolic function  3. Non-cardiac chest pain  Recommendations: Continue medical management. Will add Pepcid 20 mg po Qdaily for possible GERD. If symptoms persist, may need to consider PPI and GI workup.  Complications: None. The patient tolerated the procedure well.    Surgical History:  Past Surgical History  Procedure Laterality Date  . Tonsillectomy    . Rotator cuff repair      bilateral  . S/p knee replacement  2008  . S/p bilat wrist surgury    . S/p urethral dilations,  multi 2009,2010  . Carpal tunnel release    . S/p right knee arthroscopy  03/2005    and multi prior  . S/p umbilical hernia    . S/p left knee arthroscopy  june 2002    multi prior  . S/p right shoulder rotater cuff surgury  april 2011    Dr. [name] Lighter     Home Meds: Prior to Admission medications   Medication Sig Start Date End Date Taking? Authorizing Provider  amLODipine  (NORVASC) 5 MG tablet TAKE 1 TABLET BY MOUTH EVERY DAY 12/24/12  Yes Larey Dresser, MD  aspirin 81 MG tablet Take 1 tablet (81 mg total) by mouth daily. 06/28/12  Yes Waylan Boga, NP  atorvastatin (LIPITOR) 20 MG tablet Take 20 mg by mouth at bedtime.   Yes Historical Provider, MD  benazepril (LOTENSIN) 20 MG tablet Take 20 mg by mouth at bedtime.   Yes Historical Provider, MD  buPROPion (WELLBUTRIN XL) 300 MG 24 hr tablet Take 1 tablet (300 mg total) by mouth daily. 08/05/12  Yes [name], MD  chlorproMAZINE (THORAZINE) 10 MG tablet Take 1 tablet (10 mg total) by mouth at bedtime. 07/09/12  Yes Leonides Grills, MD  clonazePAM (KLONOPIN) 1 MG tablet Take 1 tablet (1 mg total) by mouth 3 (three) times daily as needed for anxiety. 02/06/13  Yes [name], MD  cyclobenzaprine (FLEXERIL) 5 MG tablet Take 1 tablet (5 mg total) by mouth 3 (three) times daily as needed for muscle spasms. 02/06/13  Yes [name], MD  EFFIENT 10 MG TABS TAKE 1 TABLET  BY MOUTH DAILY 12/10/12  Yes Larey Dresser, MD  FLUoxetine (PROZAC) 40 MG capsule Take 1 capsule (40 mg total) by mouth daily. 08/05/12  Yes [name], MD  fluticasone (FLONASE) 50 MCG/ACT nasal spray Place 1 spray into the nose daily. 06/28/12  Yes Waylan Boga, NP  gabapentin (NEURONTIN) 400 MG capsule Take 800 mg by mouth 4 (four) times daily. 06/28/12  Yes Waylan Boga, NP  ibuprofen (ADVIL,MOTRIN) 800 MG tablet Take 1 tablet (800 mg total) by mouth 3 (three) times daily. 01/30/13  Yes Kristen N Ward, DO  indomethacin (INDOCIN) 25 MG capsule TAKE ONE CAPSULE BY MOUTH FOUR TIMES DAILY 01/09/13  Yes [name], MD  isosorbide mononitrate (IMDUR) 60 MG 24 hr tablet  01/09/13  Yes Larey Dresser, MD  metFORMIN (GLUCOPHAGE) 500 MG tablet Take 500 mg by mouth 2 (two) times daily with a meal.   Yes Historical Provider, MD  oxyCODONE (ROXICODONE) 15 MG immediate release tablet Take 1 tablet (15 mg total) by mouth every 6 (six) hours as needed for pain. With  limit 4 per day 08/26/12  Yes [name], MD  tamsulosin (FLOMAX) 0.4 MG CAPS capsule Take 1 capsule (0.4 mg total) by mouth daily. 01/30/13  Yes Kristen N Ward, DO  traZODone (DESYREL) 50 MG tablet Take 2 tablets (100 mg total) by mouth at bedtime. 02/06/13  Yes [name], MD  zolpidem Lorrin Mais) 10 MG tablet 2 tabs by mouth at bedtime as needed for sleep 07/24/12  Yes [name], MD    Allergies:  Allergies  Allergen Reactions  . Plavix [Clopidogrel Bisulfate]     History   Social History  . Marital Status: Divorced    Spouse Name: N/A    Number of Children: 2  . Years of Education: N/A   Occupational History  . disabled since 2006 due to ortho. heart, psych   . part time work as an Multimedia programmer, wrestling, and baseball Agricultural consultant    Social History Main Topics  . Smoking status: Former Smoker    Types: Cigars    Quit date: 08/28/2010  . Smokeless tobacco: Never Used     Comment: rare  . Alcohol Use: 0.0 oz/week     Comment: occasional  . Drug Use: No  . Sexual Activity: Not Currently   Other Topics Concern  . Not on file   Social History Narrative   Played semi-pro football, used steroids   Divorced moved here from [place], [place]           Family History  Problem Relation Age of Onset  . Depression Mother   . Heart disease Mother   . Hypertension Mother   . Cancer Mother     Breast  . Diabetes Father   . Heart disease Father     CABG  . Cancer Father     prostate and skin cancer  . Hypertension Father   . Hyperlipidemia Father   . Depression Brother     x 2  . Hypertension Brother     x2  . Coronary artery disease Other   . Hypertension Other   . Depression Other   . Heart disease Maternal Grandfather   . Early death Maternal Grandfather 55    heart attack  . Early death Paternal Grandfather     Review of Systems: All other systems reviewed and are otherwise negative except as noted above.  Labs:   Lab Results  Component  Value Date   WBC 5.2 02/07/2013   HGB 12.8* 02/07/2013   HCT 36.8* 02/07/2013   MCV 91.1 02/07/2013   PLT 251 02/07/2013    Recent Labs Lab 02/07/13 1517  NA 142  K 4.5  CL 106  BUN 19  CREATININE 1.00  GLUCOSE 102*    Lab Results  Component Value Date   CHOL 129 05/06/2012   HDL 31.00* 05/06/2012   LDLCALC 83 05/06/2012   TRIG 77.0 05/06/2012   Troponin neg x 1.  Radiology/Studies:  Dg Chest 2 View9/19/2014   CLINICAL DATA:  Chest pain, shortness of breast, coronary stents  EXAM: CHEST  2 VIEW  COMPARISON:  CTA chest dated 01/30/2013  FINDINGS: Lungs are clear. No pleural effusion or pneumothorax.  The heart is normal in size. Coronary stents.  Degenerative changes of the visualized thoracolumbar spine.  IMPRESSION: No evidence of acute cardiopulmonary disease.   Electronically Signed   By: Julian Hy M.D.   On: 02/07/2013 17:59   EKG: NSR 61bpm nonspecific IVCD no acute ST-T changes  Physical Exam: Blood pressure 114/55, pulse 49, temperature 98.6 F (37 C), temperature source Oral, resp. rate 11, SpO2 98.00%. General: Well developed, well nourished, in no acute distress. Head: Normocephalic, atraumatic, sclera non-icteric, no xanthomas, nares are without discharge.  Neck: Negative for carotid bruits. JVD not elevated. Lungs: Clear bilaterally to auscultation without wheezes, rales, or rhonchi. Breathing is unlabored. Heart: RRR with S1 S2. No murmurs, rubs, or gallops appreciated. Abdomen: Soft, non-tender, non-distended with normoactive bowel sounds. No hepatomegaly. No rebound/guarding. No obvious abdominal masses. Msk:  Strength and tone appear normal for age. Extremities: No clubbing or cyanosis. No edema.  Distal pedal pulses are 2+ and equal bilaterally. Neuro: Alert and oriented X 3. No focal deficit. No facial asymmetry. Moves all extremities spontaneously. Psych:  Responds to questions appropriately with a normal affect.    ASSESSMENT AND PLAN:  1. Chest  pain, atypical and typical features 2. CAD 3. Chronic pain  Signed, Melina Copa PA-C 02/07/2013, 6:44 PM   Patient seen with PA, agree with the above note.  Patient has an extensive PCI history and chronic chest pain, with last cath in 6/13 showing patent stents.  He has been in [place] for about 4 months.  He was having atypical chest pain in [place] and apparently had an abnormal stress test down there.  A cath was recommended but he came back to [place].  Today, in the setting of being arrested, he developed central to right-sided CP like someone standing on his chest.  This has been going on periodically throughout the day.  ECG is unchanged and enzymes negative.  Difficult situation: has had extensive prior disease but also more recently has had atypical chest pain with cardiac cath last year showing no significant disease. He also had an abnormal stress in [place] and was told to have a cardiac cath.  - Admit, cycle cardiac enzymes. - Continue heparin gtt for now, can stop if rules out.  - Continue ASA, statin, Effient.   - No beta blocker given asymptomatic mild bradycardia - Would not re-stress, would go ahead to definitive test by cardiac catheterization.   Loralie Champagne 02/07/2013 7:21 PM

## 2013-02-07 NOTE — ED Notes (Signed)
Patient became anxious and stated his chest was hurting again. Applied O2 at 2L [place] and patient stated his chest discomfort went away.

## 2013-02-07 NOTE — ED Notes (Addendum)
Called report to [place], RN unit 2H.

## 2013-02-07 NOTE — Progress Notes (Signed)
ANTICOAGULATION CONSULT NOTE - Initial Consult  Pharmacy Consult for Heparin Indication: chest pain/ACS  Allergies  Allergen Reactions  . Plavix [Clopidogrel Bisulfate]     Patient Measurements:   Wt Readings from Last 3 Encounters:  02/06/13 279 lb 4 oz (126.667 kg)  01/30/13 275 lb (124.739 kg)  07/01/12 300 lb (136.079 kg)   Heparin Dosing Weight: 108 kg  Vital Signs: Temp: 98.6 F (37 C) (09/19 1700) Temp src: Oral (09/19 1700) BP: 109/50 mmHg (09/19 1830) Pulse Rate: 49 (09/19 1830)  Labs:  Recent Labs  02/07/13 1517 02/07/13 1630  HGB 12.6* 12.8*  HCT 37.0* 36.8*  PLT  --  251  CREATININE 1.00  --     The CrCl is unknown because both a height and weight (above a minimum accepted value) are required for this calculation.   Medical History: Past Medical History  Diagnosis Date  . Impaired glucose tolerance 09/27/2010  . HYPERLIPIDEMIA-MIXED 07/15/2009  . Chronic pain syndrome 10/27/2009  . HYPERTENSION 06/24/2009  . CORONARY ARTERY DISEASE 06/24/2009    s/p multiple PCIs.  In 2008 he had a Taxus DES to the mild LAD, Endeavor DES to mid LCX and distal LCX. In January 2009 he had DES to distal LCX, mid LCX and proximal LCX. In November 2009 had BMS x 2 to the mid RCA. Cath 10/2011 with patent stents, noncardiac CP.  [name] URETHRAL STRICTURE 06/24/2009  . DEGENERATIVE JOINT DISEASE 06/24/2009  . SHOULDER PAIN, BILATERAL 06/24/2009  . ANKLE PAIN, RIGHT 06/24/2009  . [place] DISEASE, LUMBAR 04/19/2010  . SCIATICA, LEFT 04/19/2010  . NEPHROLITHIASIS, HX OF 06/24/2009  . ERECTILE DYSFUNCTION, ORGANIC 05/30/2010  . OSA (obstructive sleep apnea)     not using CPAP very often  . Obesity   . Allergy to clopidogrel   . ALLERGIC RHINITIS 06/24/2009  . Anxiety   . Depression     Prior suicide attempt    Medications:   (Not in a hospital admission)  Assessment: 62 year old male with a history of CAD presenting with chest pain.  To begin anticoagulation with Heparin.  No  contraindications identified.  Goal of Therapy:  Heparin level 0.3-0.7 units/ml Monitor platelets by anticoagulation protocol: Yes   Plan:  Heparin 4000 units IV bolus Start Heparin infusion at 1500 units/hr (~14 units/kg/hr) Check Heparin level in 6 hours Daily Heparin level and CBC Check baseline PT/PTT  Legrand Como, Pharm.D., BCPS, AAHIVP Clinical Pharmacist Phone [phone] or [phone] 02/07/2013, 7:11 PM

## 2013-02-08 LAB — BASIC METABOLIC PANEL
BUN: 16 mg/dL (ref 6–23)
Calcium: 8.3 mg/dL — ABNORMAL LOW (ref 8.4–10.5)
Chloride: 106 mEq/L (ref 96–112)
GFR calc Af Amer: >90 mL/min (ref >90)
GFR calc non Af Amer: 89 mL/min — ABNORMAL LOW (ref >90)
Glucose, Bld: 142 mg/dL — ABNORMAL HIGH (ref 70–99)
Potassium: 3.4 mEq/L — ABNORMAL LOW (ref 3.5–5.1)
Sodium: 140 mEq/L (ref 135–145)

## 2013-02-08 LAB — CBC
HCT: 32.2 % — ABNORMAL LOW (ref 39.0–52.0)
MCHC: 34.5 g/dL (ref 30.0–36.0)
MCV: 91.5 fL (ref 78.0–100.0)
RDW: 13.5 % (ref 11.5–15.5)

## 2013-02-08 LAB — TROPONIN I
Troponin I: <0.3 ng/mL (ref <0.30)
Troponin I: <0.3 ng/mL (ref <0.30)

## 2013-02-08 LAB — GLUCOSE, CAPILLARY
Glucose-Capillary: 103 mg/dL — ABNORMAL HIGH (ref 70–99)
Glucose-Capillary: 103 mg/dL — ABNORMAL HIGH (ref 70–99)
Glucose-Capillary: 121 mg/dL — ABNORMAL HIGH (ref 70–99)
Glucose-Capillary: 119 mg/dL — ABNORMAL HIGH (ref 70–99)

## 2013-02-08 LAB — HEPARIN LEVEL (UNFRACTIONATED): Heparin Unfractionated: 0.21 IU/mL — ABNORMAL LOW (ref 0.30–0.70)

## 2013-02-08 MED ORDER — INFLUENZA VAC SPLIT QUAD 0.5 ML IM SUSP
0.5000 mL | INTRAMUSCULAR | Status: AC
  Administered 2013-02-09: 0.5 mL via INTRAMUSCULAR
  Filled 2013-02-08: qty 0.5

## 2013-02-08 MED ORDER — GABAPENTIN 400 MG PO CAPS
800.0000 mg | ORAL_CAPSULE | Freq: Four times a day (QID) | ORAL | Status: DC
  Administered 2013-02-08 – 2013-02-10 (×8): 800 mg via ORAL
  Filled 2013-02-08 (×11): qty 2

## 2013-02-08 NOTE — Progress Notes (Signed)
Patient ID: Travis [name], male   DOB: [birth year], 62 y.o.   MRN: [id number]     Subjective:  Denies SSCP, palpitations or Dyspnea   Objective:  Filed Vitals:   02/08/13 0500 02/08/13 0600 02/08/13 0700 02/08/13 0800  BP: 86/40 91/46 93/45  134/80  Pulse: 37 41 42 76  Temp:    97.7 F (36.5 C)  TempSrc:    Oral  Resp: 16 18 18 22   Height:      Weight:      SpO2: 99% 100% 99% 100%    Intake/Output from previous day:  Intake/Output Summary (Last 24 hours) at 02/08/13 0919 Last data filed at 02/08/13 0600  Gross per 24 hour  Intake  593.8 ml  Output    425 ml  Net  168.8 ml    Physical Exam: Affect appropriate Healthy:  appears stated age HEENT: normal Neck supple with no adenopathy JVP normal no bruits no thyromegaly Lungs clear with no wheezing and good diaphragmatic motion Heart:  S1/S2 no murmur, no rub, gallop or click PMI normal Abdomen: benighn, BS positve, no tenderness, no AAA no bruit.  No HSM or HJR Distal pulses intact with no bruits No edema Neuro non-focal Skin warm and dry No muscular weakness   Lab Results: Basic Metabolic Panel:  Recent Labs  02/07/13 1517 02/08/13 0147  NA 142 140  K 4.5 3.4*  CL 106 106  CO2  --  27  GLUCOSE 102* 142*  BUN 19 16  CREATININE 1.00 0.95  CALCIUM  --  8.3*   Liver Function Tests:  Recent Labs  02/07/13 2155  AST 18  ALT 18  ALKPHOS 54  BILITOT 0.4  PROT 6.3  ALBUMIN 3.7    Recent Labs  02/07/13 2155  LIPASE 16   CBC:  Recent Labs  02/07/13 1630 02/08/13 0147  WBC 5.2 4.2  NEUTROABS 4.0  --   HGB 12.8* 11.1*  HCT 36.8* 32.2*  MCV 91.1 91.5  PLT 251 206   Cardiac Enzymes:  Recent Labs  02/07/13 2155 02/08/13 0237  TROPONINI <0.30 <0.30   Hemoglobin A1C:  Recent Labs  02/07/13 0957  HGBA1C 5.9    Imaging: Dg Chest 2 View  02/07/2013   CLINICAL DATA:  Chest pain, shortness of breast, coronary stents  EXAM: CHEST  2 VIEW  COMPARISON:  CTA chest dated 01/30/2013   FINDINGS: Lungs are clear. No pleural effusion or pneumothorax.  The heart is normal in size. Coronary stents.  Degenerative changes of the visualized thoracolumbar spine.  IMPRESSION: No evidence of acute cardiopulmonary disease.   Electronically Signed   By: Julian Hy M.D.   On: 02/07/2013 17:59    Cardiac Studies:  ECG:   02/08/2013   SR rate 54 normal    Telemetry:  NSR no VT  Echo:   Medications:   . amLODipine  5 mg Oral Daily  . aspirin  81 mg Oral Daily  . atorvastatin  20 mg Oral QHS  . benazepril  20 mg Oral QHS  . buPROPion  300 mg Oral Daily  . chlorproMAZINE  10 mg Oral QHS  . FLUoxetine  40 mg Oral Daily  . fluticasone  1 spray Each Nare Daily  . gabapentin  400 mg Oral BID  . insulin aspart  0-9 Units Subcutaneous TID WC  . prasugrel  10 mg Oral Daily  . sodium chloride  3 mL Intravenous Q12H  . traZODone  100 mg Oral QHS  Assessment/Plan:  Chest pain:  Multiple stents  See note by DM  Cath Monday.  Discussed with patient he is amenable.  R/O D/C iv heparin and nitro  Continue DAT with Effient and ASA HTN:  Continue amlodipine and benazepril Depression:  Continue fluoxetine  Jenkins Rouge 02/08/2013, 9:19 AM

## 2013-02-08 NOTE — Progress Notes (Signed)
Pt c/o chest pain 7/10 mid sternal non radiating. Per CP protocol bp 100/40 1 sl nitro given. Pain down to 4/10 in five min. bp 93/37 1 sl nitro given pain resolved in @ 5 min. ekg showing NSR placed on chart. Will continue to monitor and advise attending as needed.

## 2013-02-08 NOTE — Progress Notes (Signed)
ANTICOAGULATION CONSULT NOTE - Initial Consult  Pharmacy Consult for Heparin Indication: chest pain/ACS  Allergies  Allergen Reactions  . Plavix [Clopidogrel Bisulfate]     Patient Measurements: Height: 6' (182.9 cm) Weight: 274 lb 7.6 oz (124.5 kg) IBW/kg (Calculated) : 77.6 Wt Readings from Last 3 Encounters:  02/07/13 274 lb 7.6 oz (124.5 kg)  02/06/13 279 lb 4 oz (126.667 kg)  01/30/13 275 lb (124.739 kg)   Heparin Dosing Weight: 108 kg  Vital Signs: Temp: 98.3 F (36.8 C) (09/20 0000) Temp src: Oral (09/20 0000) BP: 79/40 mmHg (09/20 0200) Pulse Rate: 44 (09/20 0200)  Labs:  Recent Labs  02/07/13 1517 02/07/13 1630 02/07/13 1951 02/07/13 2155 02/08/13 0147  HGB 12.6* 12.8*  --   --  11.1*  HCT 37.0* 36.8*  --   --  32.2*  PLT  --  251  --   --  206  APTT  --   --  31  --   --   LABPROT  --   --  14.6  --   --   INR  --   --  1.16  --   --   HEPARINUNFRC  --   --   --   --  0.21*  CREATININE 1.00  --   --   --   --   TROPONINI  --   --   --  <0.30  --     Estimated Creatinine Clearance: 107.1 ml/min (by C-G formula based on Cr of 1).   Medical History: Past Medical History  Diagnosis Date  . Impaired glucose tolerance 09/27/2010  . HYPERLIPIDEMIA-MIXED 07/15/2009  . Chronic pain syndrome 10/27/2009  . HYPERTENSION 06/24/2009  . CORONARY ARTERY DISEASE 06/24/2009    s/p multiple PCIs.  In 2008 he had a Taxus DES to the mild LAD, Endeavor DES to mid LCX and distal LCX. In January 2009 he had DES to distal LCX, mid LCX and proximal LCX. In November 2009 had BMS x 2 to the mid RCA. Cath 10/2011 with patent stents, noncardiac CP.  [name] URETHRAL STRICTURE 06/24/2009  . DEGENERATIVE JOINT DISEASE 06/24/2009  . SHOULDER PAIN, BILATERAL 06/24/2009  . ANKLE PAIN, RIGHT 06/24/2009  . [place] DISEASE, LUMBAR 04/19/2010  . SCIATICA, LEFT 04/19/2010  . NEPHROLITHIASIS, HX OF 06/24/2009  . ERECTILE DYSFUNCTION, ORGANIC 05/30/2010  . OSA (obstructive sleep apnea)     not using CPAP very  often  . Obesity   . Allergy to clopidogrel   . ALLERGIC RHINITIS 06/24/2009  . Anxiety   . Depression     Prior suicide attempt    Medications:  Prescriptions prior to admission  Medication Sig Dispense Refill  . amLODipine (NORVASC) 5 MG tablet TAKE 1 TABLET BY MOUTH EVERY DAY  30 tablet  0  . aspirin 81 MG tablet Take 1 tablet (81 mg total) by mouth daily.  30 tablet  0  . atorvastatin (LIPITOR) 20 MG tablet Take 20 mg by mouth at bedtime.      . benazepril (LOTENSIN) 20 MG tablet Take 20 mg by mouth at bedtime.      [name] buPROPion (WELLBUTRIN XL) 300 MG 24 hr tablet Take 1 tablet (300 mg total) by mouth daily.  30 tablet  8  . chlorproMAZINE (THORAZINE) 10 MG tablet Take 1 tablet (10 mg total) by mouth at bedtime.  30 tablet  0  . clonazePAM (KLONOPIN) 1 MG tablet Take 1 tablet (1 mg total) by mouth 3 (three) times  daily as needed for anxiety.  90 tablet  2  . cyclobenzaprine (FLEXERIL) 5 MG tablet Take 1 tablet (5 mg total) by mouth 3 (three) times daily as needed for muscle spasms.  90 tablet  2  . EFFIENT 10 MG TABS TAKE 1 TABLET BY MOUTH DAILY  30 tablet  3  . FLUoxetine (PROZAC) 40 MG capsule Take 1 capsule (40 mg total) by mouth daily.  30 capsule  8  . fluticasone (FLONASE) 50 MCG/ACT nasal spray Place 1 spray into the nose daily.  16 g  0  . gabapentin (NEURONTIN) 400 MG capsule Take 800 mg by mouth 4 (four) times daily.      [name] ibuprofen (ADVIL,MOTRIN) 800 MG tablet Take 1 tablet (800 mg total) by mouth 3 (three) times daily.  21 tablet  0  . indomethacin (INDOCIN) 25 MG capsule TAKE ONE CAPSULE BY MOUTH FOUR TIMES DAILY  360 capsule  2  . isosorbide mononitrate (IMDUR) 60 MG 24 hr tablet       . metFORMIN (GLUCOPHAGE) 500 MG tablet Take 500 mg by mouth 2 (two) times daily with a meal.      . oxyCODONE (ROXICODONE) 15 MG immediate release tablet Take 1 tablet (15 mg total) by mouth every 6 (six) hours as needed for pain. With limit 4 per day  120 tablet  0  . tamsulosin (FLOMAX)  0.4 MG CAPS capsule Take 1 capsule (0.4 mg total) by mouth daily.  30 capsule  0  . traZODone (DESYREL) 50 MG tablet Take 2 tablets (100 mg total) by mouth at bedtime.  60 tablet  5  . zolpidem (AMBIEN) 10 MG tablet 2 tabs by mouth at bedtime as needed for sleep  60 tablet  2    Assessment: 62 year old male with a history of CAD presenting with chest pain.  Initial heparin level is 0.21 units/ml.  Goal of Therapy:  Heparin level 0.3-0.7 units/ml Monitor platelets by anticoagulation protocol: Yes   Plan:  Increase heparin infusion at 1800 units/hr Check Heparin level in 6 hours  Arvel Oquinn, Pharm.D Clinical Pharmacist Phone  [phone] 02/08/2013, 3:10 AM

## 2013-02-09 LAB — GLUCOSE, CAPILLARY
Glucose-Capillary: 91 mg/dL (ref 70–99)
Glucose-Capillary: 107 mg/dL — ABNORMAL HIGH (ref 70–99)
Glucose-Capillary: 95 mg/dL (ref 70–99)

## 2013-02-09 LAB — CBC
HCT: 36.1 % — ABNORMAL LOW (ref 39.0–52.0)
Hemoglobin: 12.2 g/dL — ABNORMAL LOW (ref 13.0–17.0)
RDW: 13.8 % (ref 11.5–15.5)
WBC: 4.6 10*3/uL (ref 4.0–10.5)

## 2013-02-09 MED ORDER — SODIUM CHLORIDE 0.9 % IJ SOLN: 3.0000 mL | INTRAMUSCULAR | Status: DC | PRN

## 2013-02-09 MED ORDER — TRAMADOL HCL 50 MG PO TABS
50.0000 mg | ORAL_TABLET | Freq: Four times a day (QID) | ORAL | Status: DC | PRN
  Administered 2013-02-09 (×2): 50 mg via ORAL
  Filled 2013-02-09 (×2): qty 1

## 2013-02-09 MED ORDER — SODIUM CHLORIDE 0.9 % IJ SOLN
3.0000 mL | Freq: Two times a day (BID) | INTRAMUSCULAR | Status: DC
  Administered 2013-02-09: 3 mL via INTRAVENOUS

## 2013-02-09 MED ORDER — PNEUMOCOCCAL VAC POLYVALENT 25 MCG/0.5ML IJ INJ
0.5000 mL | INJECTION | INTRAMUSCULAR | Status: DC
  Filled 2013-02-09: qty 0.5

## 2013-02-09 MED ORDER — ASPIRIN 81 MG PO CHEW
324.0000 mg | CHEWABLE_TABLET | ORAL | Status: AC
  Administered 2013-02-10: 324 mg via ORAL
  Filled 2013-02-09: qty 4

## 2013-02-09 MED ORDER — SODIUM CHLORIDE 0.9 % IV SOLN: 250.0000 mL | INTRAVENOUS | Status: DC | PRN

## 2013-02-09 NOTE — Progress Notes (Signed)
Patient ID: Travis [name], male   DOB: 1950-11-10, 62 y.o.   MRN: [id number]     Subjective:  Still with intermitant episodes of pain    Objective:  Filed Vitals:   02/08/13 2015 02/08/13 2350 02/09/13 0615 02/09/13 0725  BP: 110/47 98/50 106/51 108/52  Pulse:      Temp:  98.5 F (36.9 C)  98.1 F (36.7 C)  TempSrc:  Oral  Oral  Resp:    14  Height:      Weight:      SpO2: 98% 97% 97% 93%    Intake/Output from previous day:  Intake/Output Summary (Last 24 hours) at 02/09/13 9563 Last data filed at 02/09/13 0600  Gross per 24 hour  Intake      0 ml  Output   1700 ml  Net  -1700 ml    Physical Exam: Affect appropriate Healthy:  appears stated age HEENT: normal Neck supple with no adenopathy JVP normal no bruits no thyromegaly Lungs clear with no wheezing and good diaphragmatic motion Heart:  S1/S2 no murmur, no rub, gallop or click PMI normal Abdomen: benighn, BS positve, no tenderness, no AAA no bruit.  No HSM or HJR Distal pulses intact with no bruits No edema Neuro non-focal Skin warm and dry No muscular weakness   Lab Results: Basic Metabolic Panel:  Recent Labs  02/07/13 1517 02/08/13 0147  NA 142 140  K 4.5 3.4*  CL 106 106  CO2  --  27  GLUCOSE 102* 142*  BUN 19 16  CREATININE 1.00 0.95  CALCIUM  --  8.3*   Liver Function Tests:  Recent Labs  02/07/13 2155  AST 18  ALT 18  ALKPHOS 54  BILITOT 0.4  PROT 6.3  ALBUMIN 3.7    Recent Labs  02/07/13 2155  LIPASE 16   CBC:  Recent Labs  02/07/13 1630 02/08/13 0147 02/09/13 0441  WBC 5.2 4.2 4.6  NEUTROABS 4.0  --   --   HGB 12.8* 11.1* 12.2*  HCT 36.8* 32.2* 36.1*  MCV 91.1 91.5 92.8  PLT 251 206 201   Cardiac Enzymes:  Recent Labs  02/07/13 2155 02/08/13 0237 02/08/13 0835  TROPONINI <0.30 <0.30 <0.30   Hemoglobin A1C:  Recent Labs  02/07/13 0957  HGBA1C 5.9    Imaging: Dg Chest 2 View  02/07/2013   CLINICAL DATA:  Chest pain, shortness of breast,  coronary stents  EXAM: CHEST  2 VIEW  COMPARISON:  CTA chest dated 01/30/2013  FINDINGS: Lungs are clear. No pleural effusion or pneumothorax.  The heart is normal in size. Coronary stents.  Degenerative changes of the visualized thoracolumbar spine.  IMPRESSION: No evidence of acute cardiopulmonary disease.   Electronically Signed   By: Julian Hy M.D.   On: 02/07/2013 17:59    Cardiac Studies:  ECG:   02/09/2013   SR rate 54 normal    Telemetry:  NSR no VT  Echo:   Medications:   . amLODipine  5 mg Oral Daily  . aspirin  81 mg Oral Daily  . atorvastatin  20 mg Oral QHS  . benazepril  20 mg Oral QHS  . buPROPion  300 mg Oral Daily  . chlorproMAZINE  10 mg Oral QHS  . FLUoxetine  40 mg Oral Daily  . fluticasone  1 spray Each Nare Daily  . gabapentin  800 mg Oral QID  . influenza vac split quadrivalent PF  0.5 mL Intramuscular Tomorrow-1000  . insulin aspart  0-9 Units Subcutaneous TID WC  . [START ON 02/10/2013] pneumococcal 23 valent vaccine  0.5 mL Intramuscular Tomorrow-1000  . prasugrel  10 mg Oral Daily  . sodium chloride  3 mL Intravenous Q12H  . traZODone  100 mg Oral QHS       Assessment/Plan:  Chest pain:  Multiple stents  See note by DM  Cath Monday.  Discussed with patient he is amenable.  R/O D/C iv heparin and nitro  Continue DAT with Effient and ASA HTN:  Continue amlodipine and benazepril Depression:  Continue fluoxetine  Pre cath orders done  Jenkins Rouge 02/09/2013, 9:39 AM

## 2013-02-10 ENCOUNTER — Encounter (HOSPITAL_COMMUNITY): Payer: Self-pay | Admitting: Physician Assistant

## 2013-02-10 ENCOUNTER — Encounter (HOSPITAL_COMMUNITY)
Admission: EM | Disposition: A | Discharge status: Home / Self Care | Payer: Self-pay | Source: Home / Self Care | Attending: Cardiology

## 2013-02-10 DIAGNOSIS — I2510 Atherosclerotic heart disease of native coronary artery without angina pectoris: Secondary | ICD-10-CM

## 2013-02-10 HISTORY — PX: LEFT HEART CATHETERIZATION WITH CORONARY ANGIOGRAM: SHX5451

## 2013-02-10 LAB — CBC
HCT: 36.7 % — ABNORMAL LOW (ref 39.0–52.0)
Hemoglobin: 12.6 g/dL — ABNORMAL LOW (ref 13.0–17.0)
MCH: 31.4 pg (ref 26.0–34.0)
MCHC: 34.3 g/dL (ref 30.0–36.0)
MCV: 91.5 fL (ref 78.0–100.0)
Platelets: 217 10*3/uL (ref 150–400)
RBC: 4.01 MIL/uL — ABNORMAL LOW (ref 4.22–5.81)
RDW: 13.6 % (ref 11.5–15.5)
WBC: 5.1 10*3/uL (ref 4.0–10.5)

## 2013-02-10 LAB — GLUCOSE, CAPILLARY
Glucose-Capillary: 100 mg/dL — ABNORMAL HIGH (ref 70–99)
Glucose-Capillary: 96 mg/dL (ref 70–99)

## 2013-02-10 LAB — BASIC METABOLIC PANEL
CO2: 24 mEq/L (ref 19–32)
Calcium: 8.9 mg/dL (ref 8.4–10.5)
Creatinine, Ser: 0.94 mg/dL (ref 0.50–1.35)
GFR calc non Af Amer: 89 mL/min — ABNORMAL LOW (ref >90)
Glucose, Bld: 100 mg/dL — ABNORMAL HIGH (ref 70–99)
Potassium: 4.3 mEq/L (ref 3.5–5.1)
Sodium: 135 mEq/L (ref 135–145)

## 2013-02-10 SURGERY — LEFT HEART CATHETERIZATION WITH CORONARY ANGIOGRAM
Anesthesia: LOCAL

## 2013-02-10 MED ORDER — SODIUM CHLORIDE 0.9 % IV SOLN: INTRAVENOUS | Status: DC

## 2013-02-10 MED ORDER — MIDAZOLAM HCL 2 MG/2ML IJ SOLN
INTRAMUSCULAR | Status: AC
  Filled 2013-02-10: qty 2

## 2013-02-10 MED ORDER — HEPARIN SODIUM (PORCINE) 1000 UNIT/ML IJ SOLN
INTRAMUSCULAR | Status: AC
  Filled 2013-02-10: qty 1

## 2013-02-10 MED ORDER — SODIUM CHLORIDE 0.9 % IV SOLN: INTRAVENOUS | Status: AC

## 2013-02-10 MED ORDER — LIDOCAINE HCL (PF) 1 % IJ SOLN
INTRAMUSCULAR | Status: AC
  Filled 2013-02-10: qty 30

## 2013-02-10 MED ORDER — FENTANYL CITRATE 0.05 MG/ML IJ SOLN
INTRAMUSCULAR | Status: AC
  Filled 2013-02-10: qty 2

## 2013-02-10 MED ORDER — ZOLPIDEM TARTRATE 10 MG PO TABS: 10.0000 mg | ORAL_TABLET | Freq: Every evening | ORAL | Status: DC | PRN

## 2013-02-10 MED ORDER — NITROGLYCERIN 0.2 MG/ML ON CALL CATH LAB
INTRAVENOUS | Status: AC
  Filled 2013-02-10: qty 1

## 2013-02-10 MED ORDER — VERAPAMIL HCL 2.5 MG/ML IV SOLN
INTRAVENOUS | Status: AC
  Filled 2013-02-10: qty 2

## 2013-02-10 MED ORDER — NITROGLYCERIN 0.4 MG SL SUBL: 0.4000 mg | SUBLINGUAL_TABLET | SUBLINGUAL | Status: DC | PRN

## 2013-02-10 MED ORDER — METFORMIN HCL 500 MG PO TABS: 500.0000 mg | ORAL_TABLET | Freq: Two times a day (BID) | ORAL | Status: DC

## 2013-02-10 MED ORDER — HEPARIN (PORCINE) IN NACL 2-0.9 UNIT/ML-% IJ SOLN
INTRAMUSCULAR | Status: AC
  Filled 2013-02-10: qty 1000

## 2013-02-10 NOTE — Assessment & Plan Note (Signed)
[facility] [facility] for f/u with podiatry, will refer

## 2013-02-10 NOTE — Assessment & Plan Note (Addendum)
For incr klonopin 1 mg tid prn,  to f/u any worsening symptoms or concerns, declines other tx or referral today  Note:  Total time for pt hx, exam, review of record with pt in the room, determination of diagnoses and plan for further eval and tx is > 40 min, with over 50% spent in coordination and counseling of patient

## 2013-02-10 NOTE — Interval H&P Note (Signed)
History and Physical Interval Note:  02/10/2013 8:57 AM  Travis [name]  has presented today for cardiac cath with the diagnosis of unstable angina. The various methods of treatment have been discussed with the patient and family. After consideration of risks, benefits and other options for treatment, the patient has consented to  Procedure(s): LEFT HEART CATHETERIZATION WITH CORONARY ANGIOGRAM (N/A) as a surgical intervention .  The patient's history has been reviewed, patient examined, no change in status, stable for surgery.  I have reviewed the patient's chart and labs.  Questions were answered to the patient's satisfaction.    Cath Lab Visit (complete for each Cath Lab visit)  Clinical Evaluation Leading to the Procedure:   ACS: no  Non-ACS:    Anginal Classification: CCS III  Anti-ischemic medical therapy: Maximal Therapy (2 or more classes of medications)  Non-Invasive Test Results: No non-invasive testing performed  Prior CABG: No previous CABG         Clementine Soulliere

## 2013-02-10 NOTE — Discharge Summary (Signed)
Discharge Summary   Patient ID: Travis [name] MRN: [id number], DOB/AGE: Jul 31, 1950 62 y.o. Admit date: 02/07/2013 D/C date:     02/10/2013  Primary Cardiologist: Aundra Dubin  Primary Discharge Diagnoses:  1. Chest pain noncardiac, suspect anxiety - cath as below 2. CAD - cath today: patent stents in RCA, LAD, Cx, normal LV function EF 55% - s/p multiple prior stents (In 2008 he had a Taxus DES to the mild LAD, Endeavor DES to mid LCX and distal LCX. In January 2009 he had DES to distal LCX, mid LCX and proximal LCX. In November 2009 had BMS x 2 to the mid RCA. Cath 10/2011 with patent stents, noncardiac CP.)   Secondary Discharge Diagnoses:  1. Impaired glucose tolerance 2. Hyperlipidemia - mixed 3. Chronic pain syndrome 4. HTN 5. Urethral stricture 6. DJD 7. Bilateral shoulder pain 8. R ankle pain 9. Lumbar disc disease 10. Left sciatica 11. Hx of nephrolithiasis 12. Erectile dysfunction, organic 13. OSA, not using CPAP very often 14. Obesity BMI 37.3 15. Allergy to clopidogrel  16. Allergic rhinitis 17. Depression with prior suicide attempt  36. Anxiety  Hospital Course: Travis [name] is a 62 y/o M with history of CAD (multiple PCIs - 8 stents - last cath 10/2011 with patent stents, noncardiac CP), HTN, HL, anxiety/depression with prior suicide attempt, DDD followed by pain management who presented to [facility] 02/07/2013 with chest pain while being arrested. He was in the holding cell and developed intermittent R sided chest pain like someone was stepping on/off his chest. This was associated with jaw pain and diaphoresis. He felt very anxious like a panic attack. The pain resolved but then recurred on the L and central chest in the ER. This has been intermittent without a real pattern, has lasted minutes at a time. He had rare nausea and SOB with this. IV NTG did not help. He received Percocet in the ED without improvement. He also received Flexeril and Ativan. With each  recurrence of CP, ECG remained unchanged. He reports being told recently in [facility] that he needs a heart cath. He can walk up to 9 miles without CP. This pain feels similar to prior heart pain. His chest pain was felt atypical, but his extensive history of CAD made the situation difficult. Dr. Aundra Dubin elected not to re-stress the patient and instead recommended cardiac cath for definitive testing. The patient ruled out for MI and remained stable over the weekend. Cardiac cath today demonstrated: 1. Triple vessel CAD with patent stents in the RCA, LAD and Circumflex  2. Normal LV systolic function  3. Non-cardiac chest pain  Continued medical therapy was recommended. He was advised to see his PCP for further evaluation of noncardiac CP if it recurs. He was instructed to try staying off ibuprofen/indomethacin in case this was gastric irritation. He will restart Metformin 02/13/13. Of note, the patient's med list says he was taking 2 tablets of ambien QHS - he was instructed max dose was 59m/qhs. He will continue ASA & Effient. He is not on BB due to resting bradycardia. Dr. MAngelena Formhas seen and examined the patient today and feels he is stable for discharge. Our office will arrange for f/u appt with Dr. MAundra Dubin   Discharge Vitals: Blood pressure 113/65, pulse 54, temperature 98.6 F (37 C), temperature source Oral, resp. rate 16, height 6' (1.829 m), weight 274 lb 7.6 oz (124.5 kg), SpO2 99.00%.  Labs: Lab Results  Component Value Date   WBC 5.1 02/10/2013  HGB 12.6* 02/10/2013   HCT 36.7* 02/10/2013   MCV 91.5 02/10/2013   PLT 217 02/10/2013    Recent Labs Lab 02/07/13 2155  02/10/13 0547  NA  --   < > 135  K  --   < > 4.3  CL  --   < > 101  CO2  --   < > 24  BUN  --   < > 14  CREATININE  --   < > 0.94  CALCIUM  --   < > 8.9  PROT 6.3  --   --   BILITOT 0.4  --   --   ALKPHOS 54  --   --   ALT 18  --   --   AST 18  --   --   GLUCOSE  --   < > 100*  < > = values in this interval not  displayed.  Recent Labs  02/07/13 2155 02/08/13 0237 02/08/13 0835  TROPONINI <0.30 <0.30 <0.30   Lab Results  Component Value Date   CHOL 129 05/06/2012   HDL 31.00* 05/06/2012   LDLCALC 83 05/06/2012   TRIG 77.0 05/06/2012    Diagnostic Studies/Procedures   Cardiac catheterization this admission, please see full report and above for summary.   Dg Chest 2 View  02/07/2013   CLINICAL DATA:  Chest pain, shortness of breast, coronary stents  EXAM: CHEST  2 VIEW  COMPARISON:  CTA chest dated 01/30/2013  FINDINGS: Lungs are clear. No pleural effusion or pneumothorax.  The heart is normal in size. Coronary stents.  Degenerative changes of the visualized thoracolumbar spine.  IMPRESSION: No evidence of acute cardiopulmonary disease.   Electronically Signed   By: Julian Hy M.D.   On: 02/07/2013 17:59    Discharge Medications     Medication List    STOP taking these medications       ibuprofen 800 MG tablet  Commonly known as:  ADVIL,MOTRIN     indomethacin 25 MG capsule  Commonly known as:  INDOCIN      TAKE these medications       amLODipine 5 MG tablet  Commonly known as:  NORVASC  TAKE 1 TABLET BY MOUTH EVERY DAY     aspirin 81 MG tablet  Take 1 tablet (81 mg total) by mouth daily.     atorvastatin 20 MG tablet  Commonly known as:  LIPITOR  Take 20 mg by mouth at bedtime.     benazepril 20 MG tablet  Commonly known as:  LOTENSIN  Take 20 mg by mouth at bedtime.     buPROPion 300 MG 24 hr tablet  Commonly known as:  WELLBUTRIN XL  Take 1 tablet (300 mg total) by mouth daily.     chlorproMAZINE 10 MG tablet  Commonly known as:  THORAZINE  Take 1 tablet (10 mg total) by mouth at bedtime.     clonazePAM 1 MG tablet  Commonly known as:  KLONOPIN  Take 1 tablet (1 mg total) by mouth 3 (three) times daily as needed for anxiety.     cyclobenzaprine 5 MG tablet  Commonly known as:  FLEXERIL  Take 1 tablet (5 mg total) by mouth 3 (three) times daily as  needed for muscle spasms.     EFFIENT 10 MG Tabs tablet  Generic drug:  prasugrel  TAKE 1 TABLET BY MOUTH DAILY     FLUoxetine 40 MG capsule  Commonly known as:  PROZAC  Take 1 capsule (  40 mg total) by mouth daily.     fluticasone 50 MCG/ACT nasal spray  Commonly known as:  FLONASE  Place 1 spray into the nose daily.     gabapentin 400 MG capsule  Commonly known as:  NEURONTIN  Take 800 mg by mouth 4 (four) times daily.     isosorbide mononitrate 60 MG 24 hr tablet  Commonly known as:  IMDUR  Take 90 mg by mouth daily.     metFORMIN 500 MG tablet  Commonly known as:  GLUCOPHAGE  Take 1 tablet (500 mg total) by mouth 2 (two) times daily with a meal.  Start taking on:  02/13/2013     nitroGLYCERIN 0.4 MG SL tablet  Commonly known as:  NITROSTAT  Place 1 tablet (0.4 mg total) under the tongue every 5 (five) minutes as needed for chest pain (up to 3 doses).     oxyCODONE 15 MG immediate release tablet  Commonly known as:  ROXICODONE  Take 1 tablet (15 mg total) by mouth every 6 (six) hours as needed for pain. With limit 4 per day     tamsulosin 0.4 MG Caps capsule  Commonly known as:  FLOMAX  Take 1 capsule (0.4 mg total) by mouth daily.     traZODone 50 MG tablet  Commonly known as:  DESYREL  Take 2 tablets (100 mg total) by mouth at bedtime.     zolpidem 10 MG tablet  Commonly known as:  AMBIEN  Take 1 tablet (10 mg total) by mouth at bedtime as needed for sleep.        Disposition   The patient will be discharged in stable condition to home. Discharge Orders   Future Appointments Provider Department Dept Phone   04/03/2013 1:00 PM [name], MD [facility] [phone]   Future Orders Complete By Expires   Diet - low sodium heart healthy  As directed    Discharge instructions  As directed    Comments:     Do not restart Metformin until the morning of 02/13/13.  Medicines like ibuprofen and indomethacin can irritate the stomach  lining - try stopping these medicines to see if this helps with your chest pain. Talk to your primary doctor about alternatives.   Increase activity slowly  As directed    Comments:     No driving for 2 days. No lifting over 5 lbs for 1 week. No sexual activity for 1 week. Keep procedure site clean & dry. If you notice increased pain, swelling, bleeding or pus, call/return!  You may shower, but no soaking baths/hot tubs/pools for 1 week.     Follow-up Information   Follow up with [name], MD. (Please see your primary care doctor to evaluate for non-heart causes of chest pain.)    Specialties:  Internal Medicine, Radiology   Contact information:   [address] [place] [address] [phone]       Follow up with Loralie Champagne, MD. (Our office will call you for a follow-up appointment. Please call the office if you have not heard from [place] within 5 days.)    Specialty:  Cardiology   Contact information:   [address] [place] [address] [phone]         Duration of Discharge Encounter: Greater than 30 minutes including physician and PA time.  Signed, Melina Copa PA-C 02/10/2013, 4:31 PM

## 2013-02-10 NOTE — CV Procedure (Signed)
    Cardiac Catheterization Operative Report  Travis [name] [id number] 9/22/20148:28 AM [name], MD  Procedure Performed:  1. Left Heart Catheterization 2. Selective Coronary Angiography 3. Left ventricular angiogram                                     Operator: Lauree Chandler, MD   Arterial access site: Right radial artery.   Indication: Chest pain in a patient with known CAD with multiple stenting procedures, cardiac markers negative. Last cath June 2013 with patent coronary arteries.   Procedure Details:  The risks, benefits, complications, treatment options, and expected outcomes were discussed with the patient. The patient and/or family concurred with the proposed plan, giving informed consent. The patient was brought to the cath lab after IV hydration was begun and oral premedication was given. The patient was further sedated with Versed and Fentanyl. The right wrist was assessed with an Allens test which was positive. The right wrist was prepped and draped in a sterile fashion. 1% lidocaine was used for local anesthesia. Using the modified Seldinger access technique, a 5 French sheath was placed in the right radial artery. 3 mg Verapamil was given through the sheath. 5500 units IV heparin was given. Standard diagnostic catheters were used to perform selective coronary angiography. A pigtail catheter was used to perform a left ventricular angiogram. The sheath was removed from the right radial artery and a Terumo hemostasis band was applied at the arteriotomy site on the right wrist.   There were no immediate complications. The patient was taken to the recovery area in stable condition.   Hemodynamic Findings:  Central aortic pressure:  116/58 Left ventricular pressure:  115/8/14  Angiographic Findings:   Left main: No obstructive disease.   Left Anterior Descending Artery: Large vessel that courses to the apex. There is a patent stent in the mid vessel without  significant restenosis.  There are minor luminal irregularities in the mid and distal vessel. The diagonal branch is patent without obstructive disease.   Circumflex Artery: Moderate sized vessel with patents stents in the proximal and mid vessel and in the distal OM branch. No restenosis in the stents. No obstructive disease noted.   Right Coronary Artery: Moderate sized dominant vessel with patent proximal stents. There is 20-30% restenosis in the stented segment. Mild plaque in the mid vessel.   Left Ventricular Angiogram: LVEF 55%.   Impression:  1. Triple vessel CAD with patent stents in the RCA, LAD and Circumflex  2. Normal LV systolic function  3. Non-cardiac chest pain   Recommendations: Continue medical management. Discharge home today after bedrest.   Complications: None. The patient tolerated the procedure well.

## 2013-02-10 NOTE — Assessment & Plan Note (Addendum)
For referral cont current med, no refill done today

## 2013-02-10 NOTE — Progress Notes (Signed)
TR BAND REMOVAL  LOCATION:    right radial  DEFLATED PER PROTOCOL:    yes  TIME BAND OFF / DRESSING APPLIED:    1450   SITE UPON ARRIVAL:    Level 0  SITE AFTER BAND REMOVAL:    Level 0  REVERSE ALLEN'S TEST:     positive  CIRCULATION SENSATION AND MOVEMENT:    Within Normal Limits   yes  COMMENTS:   Tolerated procedure well, rebled with initial air draw from TR Band

## 2013-02-10 NOTE — Assessment & Plan Note (Signed)
[place] for trazodone qhs prn,  to f/u any worsening symptoms or concerns

## 2013-02-10 NOTE — Care Management Note (Unsigned)
    Page 1 of 1   02/10/2013     3:32:22 PM   CARE MANAGEMENT NOTE 02/10/2013  Patient:  Travis [name], Travis [name]   Account Number:  [id number]  Date Initiated:  02/10/2013  Documentation initiated by:  Alleah Dearman  Subjective/Objective Assessment:   PT ADM ON 02/07/13 WITH CHEST PAIN.  PTA, PT INDEPENDENT OF ADLS.     Action/Plan:   WILL FOLLOW FOR DISCHARGE NEEDS AS PT PROGRESSES.   Anticipated DC Date:  02/10/2013   Anticipated DC Plan:  [place]  CM consult      Choice offered to / List presented to:             Status of service:  In process, will continue to follow Medicare Important Message given?   (If response is "NO", the following Medicare IM given date fields will be blank) Date Medicare IM given:   Date Additional Medicare IM given:    Discharge Disposition:    Per UR Regulation:  Reviewed for med. necessity/level of care/duration of stay  If discussed at [place] of Stay Meetings, dates discussed:    Comments:

## 2013-02-10 NOTE — Assessment & Plan Note (Signed)
stable overall by history and exam, recent data reviewed with pt, and pt to continue medical treatment as before,  to f/u any worsening symptoms or concerns Lab Results  Component Value Date   HGBA1C 5.9 02/07/2013

## 2013-02-11 LAB — DRUGS OF ABUSE SCREEN W/O ALC, ROUTINE URINE
Barbiturate Quant, Ur: NEGATIVE
Cocaine Metabolites: NEGATIVE
Creatinine,U: 212.5 mg/dL
Marijuana Metabolite: NEGATIVE
Opiate Screen, Urine: NEGATIVE
Propoxyphene: NEGATIVE

## 2013-02-13 LAB — BENZODIAZEPINE, QUANTITATIVE, URINE
Alprazolam (GC/LC/MS), ur confirm: NEGATIVE ng/mL
Alprazolam metabolite (GC/LC/MS), ur confirm: NEGATIVE ng/mL
Clonazepam metabolite (GC/LC/MS), ur confirm: 820 ng/mL
Flurazepam GC/MS Conf: NEGATIVE ng/mL
Lorazepam (GC/LC/MS), ur confirm: 198 ng/mL
Midazolam (GC/LC/MS), ur confirm: NEGATIVE ng/mL
Oxazepam GC/MS Conf: NEGATIVE ng/mL
Temazepam GC/MS Conf: NEGATIVE ng/mL
Triazolam metabolite (GC/LC/MS), ur confirm: NEGATIVE ng/mL

## 2013-02-14 ENCOUNTER — Ambulatory Visit (INDEPENDENT_AMBULATORY_CARE_PROVIDER_SITE_OTHER): Payer: Commercial Managed Care - HMO | Admitting: Psychology

## 2013-02-14 DIAGNOSIS — F331 Major depressive disorder, recurrent, moderate: Secondary | ICD-10-CM

## 2013-02-21 ENCOUNTER — Telehealth: Payer: Self-pay | Admitting: *Deleted

## 2013-02-21 MED ORDER — OXYCODONE HCL 15 MG PO TABS: 15.0000 mg | ORAL_TABLET | Freq: Four times a day (QID) | ORAL | Status: DC | PRN

## 2013-02-21 NOTE — Telephone Encounter (Signed)
Done hardcopy to [facility]  

## 2013-02-21 NOTE — Telephone Encounter (Signed)
Pt called requesting a refill on Oxycodone IR.  Please advise

## 2013-02-21 NOTE — Telephone Encounter (Signed)
Called the patient left a detailed message that requested refill has been done and can pickup hardcopy at the front desk.

## 2013-02-25 ENCOUNTER — Ambulatory Visit (HOSPITAL_COMMUNITY)
Admission: RE | Admit: 2013-02-25 | Discharge: 2013-02-25 | Disposition: A | Discharge status: Home / Self Care | Payer: No Typology Code available for payment source | Attending: Psychiatry | Admitting: Psychiatry
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
## 2013-02-27 ENCOUNTER — Other Ambulatory Visit: Payer: Self-pay | Admitting: Nurse Practitioner

## 2013-03-03 ENCOUNTER — Telehealth: Payer: Self-pay | Admitting: Internal Medicine

## 2013-03-03 ENCOUNTER — Encounter: Payer: Self-pay | Admitting: Cardiology

## 2013-03-03 ENCOUNTER — Ambulatory Visit (INDEPENDENT_AMBULATORY_CARE_PROVIDER_SITE_OTHER): Payer: Medicare HMO | Admitting: Cardiology

## 2013-03-03 ENCOUNTER — Other Ambulatory Visit (HOSPITAL_COMMUNITY): Payer: Medicare HMO | Attending: Psychiatry | Admitting: Psychiatry

## 2013-03-03 ENCOUNTER — Encounter (HOSPITAL_COMMUNITY): Payer: Self-pay

## 2013-03-03 VITALS — BP 122/70 | HR 52 | Ht 73.0 in | Wt 275.0 lb

## 2013-03-03 DIAGNOSIS — I2510 Atherosclerotic heart disease of native coronary artery without angina pectoris: Secondary | ICD-10-CM

## 2013-03-03 DIAGNOSIS — I10 Essential (primary) hypertension: Secondary | ICD-10-CM

## 2013-03-03 DIAGNOSIS — E669 Obesity, unspecified: Secondary | ICD-10-CM

## 2013-03-03 DIAGNOSIS — F331 Major depressive disorder, recurrent, moderate: Secondary | ICD-10-CM | POA: Insufficient documentation

## 2013-03-03 DIAGNOSIS — E785 Hyperlipidemia, unspecified: Secondary | ICD-10-CM

## 2013-03-03 NOTE — Progress Notes (Signed)
Patient ID: Travis [name], male   DOB: 05/02/1951, 62 y.o.   MRN: [id number] PCP: Dr. Jenny Reichmann  62 yo with history of CAD s/p multiple PCIs and obesity presents for followup.  Over the last year, he has lost about 115 lbs with diet and exercise.  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  He has very rare atypical chest pain currently but had an episode of prolonged chest pain back in 9/14 in the setting of being arrested.  He ended up having a cardiac cath which showed that all his stents were patent, nonobstructive disease.  EF was 55%.  No exertional dyspnea.    Labs (2/11): K 4.9, creatinine 1.1, HDL 42, LDL 45, TG 59, LFTs normal  Labs (3/11): [facility] clopidogrel resistance testing - 31% inhibition, 241 PRU (low level of platelet inhibition with Plavix).  Labs (1/12): LDL 51, HDL 39, K 4.5, creatinine 0.8, TSH normal  Labs (9/14): K 3.4, creatinine 0.95  ECG: NSR, normal   Allergies:  No Known Drug Allergies   Past Medical History:  1. CORONARY ARTERY DISEASE (ICD-414.00): Has had multiple PCIs, reports he never had an MI (just angina). 12/08 had Taxus DES to mLAD, Endeavor DES to [facility] [address] and to dCFX. 11/09 had BMS x 2 to mid RCA. 1/09 had Xience DES to dCFX, mCFX, and pCFX. Lexiscan myoview (3/11): EF 50%, apical hypokinesis, possible small inferoapical infarct with no ischemia. Low risk.  LHC (6/13): nonobstructive disease.  LHC (9/14): EF 55%, patent stents with nonobstructive disease, EF 55%.  2. HYPERTENSION (ICD-401.9)  3. DEPRESSION (ICD-311)  4. URETHRAL STRICTURE (ICD-598.9)  5. NEPHROLITHIASIS, HX OF (ICD-V13.01)  6. ALLERGIC RHINITIS (ICD-477.9)  7. DEGENERATIVE JOINT DISEASE (ICD-715.90): s/p bilateral TKRs  8. OSA: Not using CPAP very often.  9. Hyperlipidemia  10. Obesity  11. Echo (3/11): EF 55-60%, normal wall motion, grade I diastolic dysfunction, normal valves, normal RV.  12. Clopidogrel resistant  13. Rotator cuff repair 4/11  14. Lumbar disk disease with  chronic low back pain.  15. glucose intolerance  16. Erectile dysfunction   Family History:  mother with met cancer, unknown primary, DJD, heart disease, depression, HTN, breast cancer  father with DM, heart disease/CABG, skin cancers, HTN, elev chol, and prostate cancer  grandparent wih DJD, depression, CAD, HTN  brother x 2 with HTN, depression   Social History:  Divorced, moved here from [place].  Played semi-pro football, used steroids  2 children - grown  disabled since 2006 - due to ortho, heart, psych  Former Smoker - rare cigar now  Alcohol use-no - very occasionally  Drug use-no  work - Part time work as an Multimedia programmer, wrestling, and Recruitment consultant   Current Outpatient Prescriptions  Medication Sig Dispense Refill  . amLODipine (NORVASC) 5 MG tablet TAKE 1 TABLET BY MOUTH EVERY DAY  30 tablet  0  . aspirin 81 MG tablet Take 1 tablet (81 mg total) by mouth daily.  30 tablet  0  . atorvastatin (LIPITOR) 20 MG tablet Take 20 mg by mouth at bedtime.      . benazepril (LOTENSIN) 20 MG tablet Take 20 mg by mouth at bedtime.      [name] buPROPion (WELLBUTRIN XL) 300 MG 24 hr tablet Take 1 tablet (300 mg total) by mouth daily.  30 tablet  8  . clonazePAM (KLONOPIN) 1 MG tablet Take 1 tablet (1 mg total) by mouth 3 (three) times daily as needed for anxiety.  90 tablet  2  . EFFIENT 10 MG TABS TAKE 1 TABLET BY MOUTH DAILY  30 tablet  3  . FLUoxetine (PROZAC) 40 MG capsule Take 1 capsule (40 mg total) by mouth daily.  30 capsule  8  . fluticasone (FLONASE) 50 MCG/ACT nasal spray Place 1 spray into the nose daily.  16 g  0  . gabapentin (NEURONTIN) 400 MG capsule Take 800 mg by mouth 4 (four) times daily.      . isosorbide mononitrate (IMDUR) 60 MG 24 hr tablet Take 90 mg by mouth daily.      . metFORMIN (GLUCOPHAGE) 500 MG tablet Take 1 tablet (500 mg total) by mouth 2 (two) times daily with a meal.      . NITROSTAT 0.4 MG SL tablet PLACE 1 TABLET UNDER THE TONGUE EVERY 5  MINUTES AS NEEDED FOR CHEST PAIN  25 tablet  0  . oxyCODONE (ROXICODONE) 15 MG immediate release tablet Take 1 tablet (15 mg total) by mouth every 6 (six) hours as needed for pain. With limit 4 per day  120 tablet  0  . tiZANidine (ZANAFLEX) 4 MG tablet TAKE 3 PILLS A DAY AS NEEDED      . traZODone (DESYREL) 50 MG tablet Take 2 tablets (100 mg total) by mouth at bedtime.  60 tablet  5  . zolpidem (AMBIEN) 10 MG tablet Take 1 tablet (10 mg total) by mouth at bedtime as needed for sleep.       No current facility-administered medications for this visit.    BP 122/70  Pulse 52  Ht 6' 1"  (1.854 m)  Wt 275 lb (124.739 kg)  BMI 36.29 kg/m2 General: NAD, obese Neck: No JVD, no thyromegaly or thyroid nodule.  Lungs: Clear to auscultation bilaterally with normal respiratory effort. CV: Nondisplaced PMI.  Heart regular S1/S2, no S3/S4, no murmur.  No peripheral edema.  No carotid bruit.  Normal pedal pulses.  Abdomen: Soft, nontender, no hepatosplenomegaly, no distention.  Skin: Intact without lesions or rashes.  Neurologic: Alert and oriented x 3.  Psych: Normal affect. Extremities: No clubbing or cyanosis.   Assessment/Plan: 1. CAD: Stable, currently no ischemic symptoms.  Suspect that the episode in 9/14 was stress-related chest pain (had been arrested).  No obstructive disease on 9/14 cath, all stents patent.  Continue ASA 81, Effient (poor platelet inhibition with Plavix and he has 8 or 9 stents), and atorvastatin.  2. Obesity: I congratulated him on his extensive weight loss.   3. HTN: BP is under good control.   4. Hyperlipidemia: He is fasting, will check lipids today.   Loralie Champagne 03/04/2013

## 2013-03-03 NOTE — Patient Instructions (Addendum)
Your physician recommends that you have a FASTING lipid profile.   Your physician wants you to follow-up in: 6 months with Dr Aundra Dubin. (April 2015). You will receive a reminder letter in the mail two months in advance. If you don't receive a letter, please call our office to schedule the follow-up appointment.

## 2013-03-03 NOTE — Telephone Encounter (Signed)
[name] called from [facility] to advise that [name] has started his IOP Program.
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
## 2013-03-04 ENCOUNTER — Other Ambulatory Visit (HOSPITAL_COMMUNITY): Payer: Medicare HMO | Admitting: Psychiatry

## 2013-03-04 DIAGNOSIS — F329 Major depressive disorder, single episode, unspecified: Secondary | ICD-10-CM

## 2013-03-04 LAB — LIPID PANEL
Cholesterol: 86 mg/dL (ref 0–200)
HDL: 39.1 mg/dL (ref >39.00)
Triglycerides: 35 mg/dL (ref 0.0–149.0)

## 2013-03-05 ENCOUNTER — Encounter (HOSPITAL_COMMUNITY): Payer: Self-pay | Admitting: Psychiatry

## 2013-03-05 ENCOUNTER — Other Ambulatory Visit (HOSPITAL_COMMUNITY): Payer: Medicare HMO | Admitting: Psychiatry

## 2013-03-05 ENCOUNTER — Encounter (HOSPITAL_COMMUNITY): Payer: Self-pay

## 2013-03-05 ENCOUNTER — Other Ambulatory Visit (HOSPITAL_COMMUNITY): Payer: Self-pay | Admitting: Physician Assistant

## 2013-03-05 DIAGNOSIS — F329 Major depressive disorder, single episode, unspecified: Secondary | ICD-10-CM

## 2013-03-05 MED ORDER — BUPROPION HCL ER (XL) 150 MG PO TB24: 450.0000 mg | ORAL_TABLET | Freq: Every day | ORAL | Status: DC

## 2013-03-05 MED ORDER — FLUOXETINE HCL 20 MG PO TABS: 60.0000 mg | ORAL_TABLET | Freq: Every day | ORAL | Status: DC
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
## 2013-03-05 NOTE — Progress Notes (Signed)
    Daily Group Progress Note  Program: IOP  Group Time: 9:00-10:30 am   Participation Level: Active  Behavioral Response: Appropriate  Type of Therapy:  Process Group  Summary of Progress: Pt learned about how to use heartmath to reduce depression and anxiety symptoms.       Group Time: 10:30 am - 12:00 pm   Participation Level:  Active  Behavioral Response: Appropriate  Type of Therapy: Psycho-education Group  Summary of Progress: Pt practiced using the heartmath program to manage stress symptoms.    Tresa Res, LCSW

## 2013-03-06 ENCOUNTER — Other Ambulatory Visit (HOSPITAL_COMMUNITY): Payer: Medicare HMO | Admitting: Psychiatry

## 2013-03-06 DIAGNOSIS — F329 Major depressive disorder, single episode, unspecified: Secondary | ICD-10-CM

## 2013-03-06 NOTE — Progress Notes (Signed)
    Daily Group Progress Note  Program: IOP  Group Time: 9:00-10:30 am   Participation Level: Active  Behavioral Response: Appropriate  Type of Therapy:  Process Group  Summary of Progress: Pt presented with anxious affect and attributed it to learning that he is wanted for criminal charges for charging almost thirteen thousand dollars on a elderly woman's credit card. Pt does not see how this is a concern and is focused on defending himself. Pt described chaotic situations he is experiencing because of her impulsivity into different romantic relationships. Pt lacks insight into how his behaviors and poor insight cause chaos for him.      Group Time: 10:30 am - 12:00 pm   Participation Level:  Active  Behavioral Response: Appropriate  Type of Therapy: Psycho-education Group  Summary of Progress: Pt participated in an education segment on learning about the support groups offered through the [facility].   Tresa Res, LCSW

## 2013-03-06 NOTE — Progress Notes (Unsigned)
    Daily Group Progress Note  Program: IOP  Group Time: 9:00-10:30 am   Participation Level: Active  Behavioral Response: Appropriate  Type of Therapy:  Process Group  Summary of Progress: Pt reports high depression and disclosed that he became involved with a male group member from the last time he was in IOP and they have been "saving each other when the other one wants to kill themselves". Pt said "it has been working" but last night his male friend overdosed and is in the hospital. Pt has a tendency to get involved in unhealthy relationships and lacks insight into how his behaviors contribute to his depression and chaos in his life.      Group Time: 10:30 am - 12:00 pm   Participation Level:  Active  Behavioral Response: Appropriate  Type of Therapy: Psycho-education Group  Summary of Progress: Pt learned the stress management skill of heartmath to reduce feelings of depression and anxiety.  Tresa Res, LCSW

## 2013-03-07 ENCOUNTER — Other Ambulatory Visit (HOSPITAL_COMMUNITY): Payer: Medicare HMO | Admitting: Psychiatry

## 2013-03-07 DIAGNOSIS — F329 Major depressive disorder, single episode, unspecified: Secondary | ICD-10-CM

## 2013-03-07 NOTE — Progress Notes (Signed)
    Daily Group Progress Note  Program: IOP  Group Time: 9:00-10:30 am   Participation Level: Active  Behavioral Response: Appropriate  Type of Therapy:  Process Group  Summary of Progress: Pt pushed his chair all the way to the corner of the room and was disengaged from the group. Pt did not join the group until called upon and then talked about "having no money for food or gas". Pt continued on with this hopeless train of thought until another group member challenged Pt on it. Pt has been observed giving misinformation from day to day in the group and he has been asking group members for money. Pt as been informed to stop doing that and Pt has been redirected to focus on the behaviors that led him into his current financial and legal troubles. Pt is not taking personal responsibility over his actions that led him into his current situations and continues to seek out women for money.      Group Time: 10:30 am - 12:00 pm   Participation Level:  Active  Behavioral Response: Appropriate  Type of Therapy: Psycho-education Group  Summary of Progress: Pt learned about the symptoms of depression and how to recognize to intervene with reducing depression symptoms.   Tresa Res, LCSW

## 2013-03-10 ENCOUNTER — Telehealth (HOSPITAL_COMMUNITY): Payer: Self-pay | Admitting: Psychiatry

## 2013-03-10 ENCOUNTER — Other Ambulatory Visit (HOSPITAL_COMMUNITY): Payer: Medicare HMO

## 2013-03-11 ENCOUNTER — Other Ambulatory Visit (HOSPITAL_COMMUNITY): Payer: Medicare HMO | Admitting: Psychiatry

## 2013-03-11 ENCOUNTER — Other Ambulatory Visit: Payer: Self-pay | Admitting: Internal Medicine

## 2013-03-11 DIAGNOSIS — F329 Major depressive disorder, single episode, unspecified: Secondary | ICD-10-CM

## 2013-03-12 ENCOUNTER — Ambulatory Visit (INDEPENDENT_AMBULATORY_CARE_PROVIDER_SITE_OTHER): Payer: Medicare HMO | Admitting: Internal Medicine

## 2013-03-12 ENCOUNTER — Other Ambulatory Visit (HOSPITAL_COMMUNITY): Payer: Medicare HMO | Admitting: Psychiatry

## 2013-03-12 ENCOUNTER — Encounter: Payer: Self-pay | Admitting: Internal Medicine

## 2013-03-12 VITALS — BP 130/80 | HR 81 | Temp 97.5°F | Ht 73.0 in | Wt 277.5 lb

## 2013-03-12 DIAGNOSIS — M25552 Pain in left hip: Secondary | ICD-10-CM

## 2013-03-12 DIAGNOSIS — R2689 Other abnormalities of gait and mobility: Secondary | ICD-10-CM

## 2013-03-12 DIAGNOSIS — R29818 Other symptoms and signs involving the nervous system: Secondary | ICD-10-CM

## 2013-03-12 DIAGNOSIS — M545 Low back pain, unspecified: Secondary | ICD-10-CM

## 2013-03-12 DIAGNOSIS — R269 Unspecified abnormalities of gait and mobility: Secondary | ICD-10-CM

## 2013-03-12 DIAGNOSIS — F329 Major depressive disorder, single episode, unspecified: Secondary | ICD-10-CM

## 2013-03-12 DIAGNOSIS — M25559 Pain in unspecified hip: Secondary | ICD-10-CM

## 2013-03-12 DIAGNOSIS — R259 Unspecified abnormal involuntary movements: Secondary | ICD-10-CM

## 2013-03-12 DIAGNOSIS — R251 Tremor, unspecified: Secondary | ICD-10-CM

## 2013-03-12 HISTORY — DX: Pain in left hip: M25.552

## 2013-03-12 HISTORY — DX: Unspecified abnormalities of gait and mobility: R26.9

## 2013-03-12 HISTORY — DX: Other abnormalities of gait and mobility: R26.89

## 2013-03-12 MED ORDER — OXYCODONE HCL 15 MG PO TABS: 15.0000 mg | ORAL_TABLET | Freq: Four times a day (QID) | ORAL | Status: DC | PRN

## 2013-03-12 MED ORDER — INDOMETHACIN 25 MG PO CAPS: 25.0000 mg | ORAL_CAPSULE | Freq: Four times a day (QID) | ORAL | Status: DC | PRN

## 2013-03-12 NOTE — Assessment & Plan Note (Signed)
For ua to r/o hematuria, has hx of lumbar disc dz, for pain control ,  to f/u any worsening symptoms or concerns

## 2013-03-12 NOTE — Progress Notes (Signed)
Patient ID: Travis [name], male   DOB: 11/08/1950, 62 y.o.   MRN: [id number] Patient viewed an interview today states is tolerating the increase in the adjustments in his medications well, his Prozac was increased to 60 mg and his Wellbutrin was changed to XL 150 mg. Patient had to go to [place] 2 days ago for breaking and entering his own home when he had to go in to talk to his tendons he was charged with that. He is upset about the fine of $200 and community service. Reaching states that he feels very lonely and would like to be in a relationship. Sleep and appetite are okay, denies suicidal or homicidal ideation and has no hallucinations or delusions.

## 2013-03-12 NOTE — Assessment & Plan Note (Signed)
For left hip film, refer sports med

## 2013-03-12 NOTE — Assessment & Plan Note (Signed)
As above balance disorder

## 2013-03-12 NOTE — Patient Instructions (Signed)
Please take all new medication as prescribed - the indocin Please continue all other medications as before, and refills have been done if requested. - the oxycodone Please return tomorrow for the urine testing at the lab Please go to the XRAY Department in the Basement (go straight as you get off the elevator) for the x-ray testing tomorrow as well You will be contacted by phone if any changes need to be made immediately.  Otherwise, you will receive a letter about your results with an explanation, but please check with MyChart first.  You will be contacted regarding the referral for: Head MRI, Neurology, and Dr Tamala Julian (sports med) in our office here at [facility] for the lower back and left hip pain

## 2013-03-12 NOTE — Progress Notes (Signed)
Subjective:    Patient ID: Travis [name], male    DOB: April 13, 1951, 62 y.o.   MRN: [id number]  HPI  Here to f/u with several concerns;  C/o right lower back pain for 5 days, concerned about kidney stone, sharp, no radiation or bowel or bladder change, fever, wt loss,  worsening LE numbness/weakness, or falls. Also with new onset left groin pain worse to ambulate.  Asks for pain med refills including indocin.  Denies urinary symptoms such as dysuria, frequency, urgency, flank pain, hematuria or n/v, fever, chills.  Also with new onset 1-2 mo worsening balance disorder, bilat arm and hand tremors, stumbles and almost falls with simple turning when walking 5 steps, tends to keep trying to fall backwards.   Past Medical History  Diagnosis Date  . Impaired glucose tolerance 09/27/2010  . HYPERLIPIDEMIA-MIXED 07/15/2009  . Chronic pain syndrome 10/27/2009  . HYPERTENSION 06/24/2009  . CORONARY ARTERY DISEASE 06/24/2009    a. s/p multiple PCIs - In 2008 he had a Taxus DES to the mild LAD, Endeavor DES to mid LCX and distal LCX. In January 2009 he had DES to distal LCX, mid LCX and proximal LCX. In November 2009 had BMS x 2 to the mid RCA. Cath 10/2011 with patent stents, noncardiac CP. LHC 01/2013: patent stents (noncardiac CP).  [name] URETHRAL STRICTURE 06/24/2009  . DEGENERATIVE JOINT DISEASE 06/24/2009  . SHOULDER PAIN, BILATERAL 06/24/2009  . ANKLE PAIN, RIGHT 06/24/2009  . [place] DISEASE, LUMBAR 04/19/2010  . SCIATICA, LEFT 04/19/2010  . NEPHROLITHIASIS, HX OF 06/24/2009  . ERECTILE DYSFUNCTION, ORGANIC 05/30/2010  . OSA (obstructive sleep apnea)     not using CPAP very often  . Obesity   . Allergy to clopidogrel   . ALLERGIC RHINITIS 06/24/2009  . Anxiety   . Depression     Prior suicide attempt   Past Surgical History  Procedure Laterality Date  . Tonsillectomy    . Rotator cuff repair      bilateral  . S/p knee replacement  2008  . S/p bilat wrist surgury    . S/p urethral dilations,  multi 2009,2010  .  Carpal tunnel release    . S/p right knee arthroscopy  03/2005    and multi prior  . S/p umbilical hernia    . S/p left knee arthroscopy  june 2002    multi prior  . S/p right shoulder rotater cuff surgury  april 2011    Dr. [name] Lighter    reports that he quit smoking about 2 years ago. His smoking use included Cigars. He has never used smokeless tobacco. He reports that he drinks alcohol. He reports that he does not use illicit drugs. family history includes Cancer in his father and mother; Coronary artery disease in his other; Depression in his brother, mother, and other; Diabetes in his father; Early death in his paternal grandfather; Early death (age of onset: 69) in his maternal grandfather; Heart disease in his father, maternal grandfather, and mother; Hyperlipidemia in his father; Hypertension in his brother, father, mother, and other. No Known Allergies Current Outpatient Prescriptions on File Prior to Visit  Medication Sig Dispense Refill  . amLODipine (NORVASC) 5 MG tablet TAKE 1 TABLET BY MOUTH EVERY DAY  30 tablet  0  . aspirin 81 MG tablet Take 1 tablet (81 mg total) by mouth daily.  30 tablet  0  . atorvastatin (LIPITOR) 20 MG tablet Take 20 mg by mouth at bedtime.      . benazepril (LOTENSIN)  20 MG tablet Take 20 mg by mouth at bedtime.      [name] buPROPion (WELLBUTRIN XL) 150 MG 24 hr tablet TAKE 3 TABLETS BY MOUTH EVERY DAY  270 tablet  0  . clonazePAM (KLONOPIN) 1 MG tablet Take 1 tablet (1 mg total) by mouth 3 (three) times daily as needed for anxiety.  90 tablet  2  . EFFIENT 10 MG TABS TAKE 1 TABLET BY MOUTH DAILY  30 tablet  3  . FLUoxetine (PROZAC) 20 MG capsule TAKE 3 CAPSULES BY MOUTH EVERY DAY  270 capsule  0  . fluticasone (FLONASE) 50 MCG/ACT nasal spray PLACE 1 SPRAY INTO EACH NOSTRIL DAILY  16 g  11  . gabapentin (NEURONTIN) 400 MG capsule Take 800 mg by mouth 4 (four) times daily.      . isosorbide mononitrate (IMDUR) 60 MG 24 hr tablet Take 90 mg by mouth daily.       . metFORMIN (GLUCOPHAGE) 500 MG tablet Take 1 tablet (500 mg total) by mouth 2 (two) times daily with a meal.      . NITROSTAT 0.4 MG SL tablet PLACE 1 TABLET UNDER THE TONGUE EVERY 5 MINUTES AS NEEDED FOR CHEST PAIN  25 tablet  0  . tiZANidine (ZANAFLEX) 4 MG tablet TAKE 3 PILLS A DAY AS NEEDED      . traZODone (DESYREL) 50 MG tablet Take 2 tablets (100 mg total) by mouth at bedtime.  60 tablet  5  . zolpidem (AMBIEN) 10 MG tablet Take 1 tablet (10 mg total) by mouth at bedtime as needed for sleep.       No current facility-administered medications on file prior to visit.   Review of Systems  Constitutional: Negative for unexpected weight change, or unusual diaphoresis  HENT: Negative for tinnitus.   Eyes: Negative for photophobia and visual disturbance.  Respiratory: Negative for choking and stridor.   Gastrointestinal: Negative for vomiting and blood in stool.  Genitourinary: Negative for hematuria and decreased urine volume.  Musculoskeletal: Negative for acute joint swelling Skin: Negative for color change and wound.  Neurological: Negative for tremors and numbness other than noted  Psychiatric/Behavioral: Negative for decreased concentration or  hyperactivity.       Objective:   Physical Exam BP 130/80  Pulse 81  Temp(Src) 97.5 F (36.4 C) (Oral)  Ht 6' 1"  (1.854 m)  Wt 277 lb 8 oz (125.873 kg)  BMI 36.62 kg/m2  SpO2 95% VS noted, not ill appaering Constitutional: Pt appears well-developed and well-nourished. /morbid obese HENT: Head: NCAT.  Right Ear: External ear normal.  Left Ear: External ear normal.  Eyes: Conjunctivae and EOM are normal. Pupils are equal, round, and reactive to light.  Neck: Normal range of motion. Neck supple.  Cardiovascular: Normal rate and regular rhythm.   Pulmonary/Chest: Effort normal and breath sounds normal.  Abd:  Soft, NT, non-distended, + BS Left hip with reduced flexion and pain on rotation Tender right lumbar  paravertebral Neurological: Pt is alert. Not confused, motor 5/5 but tremor to both UE's noted, slow to stand, walks unsteady, appears at higher risk of fall  Skin: Skin is warm. No erythema.  Psychiatric: Pt behavior is normal. Thought content normal.     Assessment & Plan:

## 2013-03-12 NOTE — Assessment & Plan Note (Addendum)
?   Loss of postural reflexes - With gait issue and tremor, ? parkinsons - for neurology referral, Head MRI  Note:  Total time for pt hx, exam, review of record with pt in the room, determination of diagnoses and plan for further eval and tx is > 40 min, with over 50% spent in coordination and counseling of patient

## 2013-03-13 ENCOUNTER — Other Ambulatory Visit (INDEPENDENT_AMBULATORY_CARE_PROVIDER_SITE_OTHER): Payer: Medicare HMO

## 2013-03-13 ENCOUNTER — Ambulatory Visit (INDEPENDENT_AMBULATORY_CARE_PROVIDER_SITE_OTHER)
Admission: RE | Admit: 2013-03-13 | Discharge: 2013-03-13 | Disposition: A | Discharge status: Home / Self Care | Payer: Medicare HMO | Source: Ambulatory Visit | Attending: Internal Medicine | Admitting: Internal Medicine

## 2013-03-13 ENCOUNTER — Other Ambulatory Visit (HOSPITAL_COMMUNITY): Payer: Medicare HMO | Admitting: Psychiatry

## 2013-03-13 DIAGNOSIS — M545 Low back pain, unspecified: Secondary | ICD-10-CM

## 2013-03-13 DIAGNOSIS — F329 Major depressive disorder, single episode, unspecified: Secondary | ICD-10-CM

## 2013-03-13 DIAGNOSIS — M25559 Pain in unspecified hip: Secondary | ICD-10-CM

## 2013-03-13 DIAGNOSIS — R2689 Other abnormalities of gait and mobility: Secondary | ICD-10-CM

## 2013-03-13 DIAGNOSIS — R29818 Other symptoms and signs involving the nervous system: Secondary | ICD-10-CM

## 2013-03-13 DIAGNOSIS — M25552 Pain in left hip: Secondary | ICD-10-CM

## 2013-03-13 DIAGNOSIS — R269 Unspecified abnormalities of gait and mobility: Secondary | ICD-10-CM

## 2013-03-13 LAB — URINALYSIS, ROUTINE W REFLEX MICROSCOPIC
Bilirubin Urine: NEGATIVE
Ketones, ur: NEGATIVE
Leukocytes, UA: NEGATIVE
Nitrite: NEGATIVE
Specific Gravity, Urine: 1.02 (ref 1.000–1.030)
Total Protein, Urine: NEGATIVE
pH: 7 (ref 5.0–8.0)

## 2013-03-13 NOTE — Progress Notes (Signed)
    Daily Group Progress Note  Program: IOP  Group Time: 9:00 am -12:00 pm  Participation Level: Active  Behavioral Response: Appropriate  Type of Therapy:  Process Group  Summary of Progress: Pt learned about the symptoms of depression and how the symptoms are present in their daily living and spoke about each symptom they are experiencing.        Tresa Res, LCSW

## 2013-03-13 NOTE — Progress Notes (Signed)
    Daily Group Progress Note  Program: IOP  Group Time: 9:00-10:30 am   Participation Level: Active  Behavioral Response: Appropriate  Type of Therapy:  Process Group  Summary of Progress: Pt wore new clothes into group and when asked what happened to his severe financial difficulties that were causing him great depression yesterday he said his brother loaned him a "large sum of money". Pt has a history of using others including group members to get money and Pt appeared to be caught up on being dishonest. Pt is now saying he may have parkinson's disease and began wobbling around the group room in what appeared to be a forced way.      Group Time: 10:30 am - 12:00 pm   Participation Level:  Active  Behavioral Response: Appropriate  Type of Therapy: Psycho-education Group  Summary of Progress: Pt learned about low self-esteem and how it begins and effects mood and behaviors today.   Tresa Res, LCSW

## 2013-03-13 NOTE — Progress Notes (Signed)
    Daily Group Progress Note  Program: IOP  Group Time: 9:00 am -12:00 pm  Participation Level: Active  Behavioral Response: Appropriate  Type of Therapy:  Process Group  Summary of Progress: member participated in two goodbye ceremonies for members ending the group and had the opportunity for healthy closure. Pt identified healthy forms of grieving losses.      Tresa Res, LCSW

## 2013-03-14 ENCOUNTER — Other Ambulatory Visit (HOSPITAL_COMMUNITY): Payer: Medicare HMO | Admitting: Psychiatry

## 2013-03-14 DIAGNOSIS — F329 Major depressive disorder, single episode, unspecified: Secondary | ICD-10-CM

## 2013-03-14 DIAGNOSIS — F3289 Other specified depressive episodes: Secondary | ICD-10-CM

## 2013-03-17 ENCOUNTER — Emergency Department (HOSPITAL_COMMUNITY)
Admission: EM | Admit: 2013-03-17 | Discharge: 2013-03-17 | Disposition: A | Discharge status: Home / Self Care | Payer: Medicare HMO | Attending: Emergency Medicine | Admitting: Emergency Medicine

## 2013-03-17 ENCOUNTER — Emergency Department (HOSPITAL_COMMUNITY): Payer: Medicare HMO

## 2013-03-17 ENCOUNTER — Encounter (HOSPITAL_COMMUNITY): Payer: Self-pay | Admitting: Emergency Medicine

## 2013-03-17 ENCOUNTER — Other Ambulatory Visit (HOSPITAL_COMMUNITY): Payer: Medicare HMO | Admitting: Psychiatry

## 2013-03-17 DIAGNOSIS — Y9389 Activity, other specified: Secondary | ICD-10-CM | POA: Insufficient documentation

## 2013-03-17 DIAGNOSIS — Y92009 Unspecified place in unspecified non-institutional (private) residence as the place of occurrence of the external cause: Secondary | ICD-10-CM | POA: Insufficient documentation

## 2013-03-17 DIAGNOSIS — F329 Major depressive disorder, single episode, unspecified: Secondary | ICD-10-CM

## 2013-03-17 DIAGNOSIS — M1990 Unspecified osteoarthritis, unspecified site: Secondary | ICD-10-CM | POA: Insufficient documentation

## 2013-03-17 DIAGNOSIS — M545 Low back pain, unspecified: Secondary | ICD-10-CM | POA: Insufficient documentation

## 2013-03-17 DIAGNOSIS — R259 Unspecified abnormal involuntary movements: Secondary | ICD-10-CM | POA: Insufficient documentation

## 2013-03-17 DIAGNOSIS — S0990XA Unspecified injury of head, initial encounter: Secondary | ICD-10-CM | POA: Insufficient documentation

## 2013-03-17 DIAGNOSIS — I2510 Atherosclerotic heart disease of native coronary artery without angina pectoris: Secondary | ICD-10-CM | POA: Insufficient documentation

## 2013-03-17 DIAGNOSIS — Z87891 Personal history of nicotine dependence: Secondary | ICD-10-CM | POA: Insufficient documentation

## 2013-03-17 DIAGNOSIS — F3289 Other specified depressive episodes: Secondary | ICD-10-CM | POA: Insufficient documentation

## 2013-03-17 DIAGNOSIS — H539 Unspecified visual disturbance: Secondary | ICD-10-CM | POA: Insufficient documentation

## 2013-03-17 DIAGNOSIS — IMO0002 Reserved for concepts with insufficient information to code with codable children: Secondary | ICD-10-CM | POA: Insufficient documentation

## 2013-03-17 DIAGNOSIS — Z79899 Other long term (current) drug therapy: Secondary | ICD-10-CM | POA: Insufficient documentation

## 2013-03-17 DIAGNOSIS — R42 Dizziness and giddiness: Secondary | ICD-10-CM | POA: Insufficient documentation

## 2013-03-17 DIAGNOSIS — F411 Generalized anxiety disorder: Secondary | ICD-10-CM | POA: Insufficient documentation

## 2013-03-17 DIAGNOSIS — R296 Repeated falls: Secondary | ICD-10-CM | POA: Insufficient documentation

## 2013-03-17 DIAGNOSIS — M542 Cervicalgia: Secondary | ICD-10-CM | POA: Insufficient documentation

## 2013-03-17 DIAGNOSIS — W19XXXA Unspecified fall, initial encounter: Secondary | ICD-10-CM

## 2013-03-17 DIAGNOSIS — Z9181 History of falling: Secondary | ICD-10-CM | POA: Insufficient documentation

## 2013-03-17 DIAGNOSIS — Z87442 Personal history of urinary calculi: Secondary | ICD-10-CM | POA: Insufficient documentation

## 2013-03-17 DIAGNOSIS — I10 Essential (primary) hypertension: Secondary | ICD-10-CM | POA: Insufficient documentation

## 2013-03-17 DIAGNOSIS — M25519 Pain in unspecified shoulder: Secondary | ICD-10-CM | POA: Insufficient documentation

## 2013-03-17 DIAGNOSIS — Z9861 Coronary angioplasty status: Secondary | ICD-10-CM | POA: Insufficient documentation

## 2013-03-17 DIAGNOSIS — R251 Tremor, unspecified: Secondary | ICD-10-CM

## 2013-03-17 DIAGNOSIS — E785 Hyperlipidemia, unspecified: Secondary | ICD-10-CM | POA: Insufficient documentation

## 2013-03-17 DIAGNOSIS — R4789 Other speech disturbances: Secondary | ICD-10-CM | POA: Insufficient documentation

## 2013-03-17 DIAGNOSIS — Z7982 Long term (current) use of aspirin: Secondary | ICD-10-CM | POA: Insufficient documentation

## 2013-03-17 DIAGNOSIS — F32A Depression, unspecified: Secondary | ICD-10-CM

## 2013-03-17 DIAGNOSIS — G8929 Other chronic pain: Secondary | ICD-10-CM | POA: Insufficient documentation

## 2013-03-17 DIAGNOSIS — G4733 Obstructive sleep apnea (adult) (pediatric): Secondary | ICD-10-CM | POA: Insufficient documentation

## 2013-03-17 DIAGNOSIS — E669 Obesity, unspecified: Secondary | ICD-10-CM | POA: Insufficient documentation

## 2013-03-17 LAB — COMPREHENSIVE METABOLIC PANEL
ALT: 17 U/L (ref 0–53)
AST: 19 U/L (ref 0–37)
Albumin: 3.6 g/dL (ref 3.5–5.2)
Alkaline Phosphatase: 61 U/L (ref 39–117)
CO2: 26 mEq/L (ref 19–32)
Calcium: 9 mg/dL (ref 8.4–10.5)
GFR calc Af Amer: >90 mL/min (ref >90)
Sodium: 137 mEq/L (ref 135–145)
Total Protein: 6.4 g/dL (ref 6.0–8.3)

## 2013-03-17 LAB — CBC WITH DIFFERENTIAL/PLATELET
Basophils Absolute: 0 10*3/uL (ref 0.0–0.1)
Basophils Relative: 0 % (ref 0–1)
Eosinophils Absolute: 0.1 10*3/uL (ref 0.0–0.7)
Eosinophils Relative: 2 % (ref 0–5)
MCH: 31.3 pg (ref 26.0–34.0)
MCV: 90.6 fL (ref 78.0–100.0)
Neutrophils Relative %: 67 % (ref 43–77)
Platelets: 220 10*3/uL (ref 150–400)
RDW: 13.2 % (ref 11.5–15.5)

## 2013-03-17 MED ORDER — SODIUM CHLORIDE 0.9 % IV BOLUS (SEPSIS)
1000.0000 mL | Freq: Once | INTRAVENOUS | Status: AC
  Administered 2013-03-17: 1000 mL via INTRAVENOUS

## 2013-03-17 NOTE — ED Notes (Signed)
Pt states he has developed slurred speech and increased difficulty walking. Pt states he has had 12 falls in past 4 days. Denies injury.

## 2013-03-17 NOTE — Progress Notes (Signed)
    Daily Group Progress Note  Program: IOP  Group Time: 9:00-10:30 am   Participation Level: Active  Behavioral Response: Appropriate  Type of Therapy:  Group Therapy  Summary of Progress: Pt is labile between hopeless and hopefulness. Pt is processing financial stressors that he causes by getting involved in situations that bring crisis into his life. Pt processed his lack of a relationship with all his children.      Group Time: 10:30 am - 12:00 pm    Participation Level:  None  Behavioral Response: none  Type of Therapy: Psycho-education Group  Summary of Progress: Group ended an hour early due to Probation officer having a family emergency.   Tresa Res, LCSW

## 2013-03-17 NOTE — ED Notes (Signed)
Per EMS. Pt from home. Reports 3-4 month history of whole body tremors, has been worked up by PCP for Parkinsons. Has appointment with neurologist on Oct 31 and MRI on Nov 1. Told EMS tremor has gotten much worse over past 3-4 days and has had several falls.

## 2013-03-17 NOTE — ED Provider Notes (Signed)
CSN: [id number]     Arrival date & time 03/17/13  1806 History   First MD Initiated Contact with Patient 03/17/13 1811     Chief Complaint  Patient presents with  . Tremors  . Fall   (Consider location/radiation/quality/duration/timing/severity/associated sxs/prior Treatment) The history is provided by the patient. No language interpreter was used.  Travis [name] is a 62 y/o M with PMHx of HLD, HTN, chronic pain syndrome, CAD, DJD, anxiety, depression, cardiac cath performed in 10/2011 with stents presenting to the ED with continuous dizziness and falls. As per patient, reported that he has been experiencing poor balance for the past 7 months which has gotten progressively worse over the course of the past week. Patient reported that he has been experiencing hand tremors for the past 7 months, localized to the right hand mainly - sporadically occurring throughout the day. Patient reported that he has been losing his short term memory, issues with speech - stuttering a lot. Patient is concerned regarding his balance - reported that over the past weekend - reported that since Saturday his balance has took a turn for the worst. Patient reported that from Friday to today he has fallen at least 14-15 times. Reported that he feels like his legs come out from under him and become weak to the point where he cannot get himself balanced. Patient reported that he does not use a walker or a cane. Patient lives at home alone. Patient reported that he is aware that he is falling and what is going on around him. Reported that he has hit his head a couple of times - stated that he has not lost consciousness. Reported that yesterday he was going to throw something out in the trash and stated that as he turned he fell and hit his head. Patient reported that he has seen his PCP and reported that his PCP recommended him to come to the ED if the falls were to get worse and balance were to get worse. Patient reported that  he has an appointment for an MRI to be performed at 9:00PM March 21, 2013. Reported that he is scheduled to see Neurology on April 01, 2013. Patient reported that his PCP referred him to a brain surgeon, patient reported that he has not scheduled an appointment yet. Denied urinary and bowel incontinence, numbness, tingling, sudden loss of vision, neck pain, neck stiffness, chest pain, shortness of breath, difficulty breathing.  PCP Dr. Marshall Cork   Past Medical History  Diagnosis Date  . Impaired glucose tolerance 09/27/2010  . HYPERLIPIDEMIA-MIXED 07/15/2009  . Chronic pain syndrome 10/27/2009  . HYPERTENSION 06/24/2009  . CORONARY ARTERY DISEASE 06/24/2009    a. s/p multiple PCIs - In 2008 he had a Taxus DES to the mild LAD, Endeavor DES to mid LCX and distal LCX. In January 2009 he had DES to distal LCX, mid LCX and proximal LCX. In November 2009 had BMS x 2 to the mid RCA. Cath 10/2011 with patent stents, noncardiac CP. LHC 01/2013: patent stents (noncardiac CP).  [name] URETHRAL STRICTURE 06/24/2009  . DEGENERATIVE JOINT DISEASE 06/24/2009  . SHOULDER PAIN, BILATERAL 06/24/2009  . ANKLE PAIN, RIGHT 06/24/2009  . Love DISEASE, LUMBAR 04/19/2010  . SCIATICA, LEFT 04/19/2010  . NEPHROLITHIASIS, HX OF 06/24/2009  . ERECTILE DYSFUNCTION, ORGANIC 05/30/2010  . OSA (obstructive sleep apnea)     not using CPAP very often  . Obesity   . Allergy to clopidogrel   . ALLERGIC RHINITIS 06/24/2009  . Anxiety   .  Depression     Prior suicide attempt   Past Surgical History  Procedure Laterality Date  . Tonsillectomy    . Rotator cuff repair      bilateral  . S/p knee replacement  2008  . S/p bilat wrist surgury    . S/p urethral dilations,  multi 2009,2010  . Carpal tunnel release    . S/p right knee arthroscopy  03/2005    and multi prior  . S/p umbilical hernia    . S/p left knee arthroscopy  june 2002    multi prior  . S/p right shoulder rotater cuff surgury  april 2011    Dr. [name] Lighter   Family History   Problem Relation Age of Onset  . Depression Mother   . Heart disease Mother   . Hypertension Mother   . Cancer Mother     Breast  . Diabetes Father   . Heart disease Father     CABG  . Cancer Father     prostate and skin cancer  . Hypertension Father   . Hyperlipidemia Father   . Depression Brother     x 2  . Hypertension Brother     x2  . Coronary artery disease Other   . Hypertension Other   . Depression Other   . Heart disease Maternal Grandfather   . Early death Maternal Grandfather 57    heart attack  . Early death Paternal Grandfather    History  Substance Use Topics  . Smoking status: Former Smoker    Types: Cigars    Quit date: 08/28/2010  . Smokeless tobacco: Never Used     Comment: rare  . Alcohol Use: 0.0 oz/week     Comment: occasional    Review of Systems  Constitutional: Negative for fever and chills.  HENT: Negative for trouble swallowing.   Eyes: Positive for visual disturbance.  Respiratory: Negative for chest tightness and shortness of breath.   Cardiovascular: Negative for chest pain.  Gastrointestinal: Negative for nausea, vomiting, abdominal pain and diarrhea.  Musculoskeletal: Positive for back pain (chronic lumbar pain ) and neck pain (chronic - herniated discs).  Neurological: Positive for dizziness. Negative for weakness and numbness.  All other systems reviewed and are negative.    Allergies  Review of patient's allergies indicates no known allergies.  Home Medications   Current Outpatient Rx  Name  Route  Sig  Dispense  Refill  . amLODipine (NORVASC) 5 MG tablet   Oral   Take 5 mg by mouth daily.         [name] aspirin 81 MG tablet   Oral   Take 1 tablet (81 mg total) by mouth daily.   30 tablet   0   . atorvastatin (LIPITOR) 20 MG tablet   Oral   Take 20 mg by mouth at bedtime.         . benazepril (LOTENSIN) 20 MG tablet   Oral   Take 20 mg by mouth at bedtime.         [name] buPROPion (WELLBUTRIN XL) 150 MG 24 hr  tablet   Oral   Take 150 mg by mouth daily. Take with 300 mg to equal 450 mg         . buPROPion (WELLBUTRIN XL) 300 MG 24 hr tablet   Oral   Take 300 mg by mouth daily.         . clonazePAM (KLONOPIN) 1 MG tablet   Oral   Take 1  tablet (1 mg total) by mouth 3 (three) times daily as needed for anxiety.   90 tablet   2   . cyclobenzaprine (FLEXERIL) 5 MG tablet   Oral   Take 5 mg by mouth 3 (three) times daily as needed for muscle spasms.         . diclofenac sodium (VOLTAREN) 1 % GEL   Topical   Apply 2 g topically 4 (four) times daily.         [name] EFFIENT 10 MG TABS      TAKE 1 TABLET BY MOUTH DAILY   30 tablet   3   . FLUoxetine (PROZAC) 20 MG capsule   Oral   Take 20 mg by mouth daily. Take with 40 mg to equal 54m until 40 mg runs out         . fluticasone (FLONASE) 50 MCG/ACT nasal spray      PLACE 1 SPRAY INTO EACH NOSTRIL DAILY   16 g   11   . fluvastatin (LESCOL) 40 MG capsule   Oral   Take 40 mg by mouth daily.         .Marland Kitchengabapentin (NEURONTIN) 400 MG capsule   Oral   Take 800 mg by mouth 4 (four) times daily.         . indomethacin (INDOCIN) 25 MG capsule   Oral   Take 1 capsule (25 mg total) by mouth 4 (four) times daily as needed.   120 capsule   3   . isosorbide mononitrate (IMDUR) 60 MG 24 hr tablet   Oral   Take 90 mg by mouth daily.         .Marland Kitchenlidocaine (LIDODERM) 5 %   Transdermal   Place 1 patch onto the skin daily. Remove & Discard patch within 12 hours or as directed by MD         . metFORMIN (GLUCOPHAGE) 500 MG tablet   Oral   Take 1 tablet (500 mg total) by mouth 2 (two) times daily with a meal.           !!!!!!!!!!!!!!!!!!!!!!!!!!!!!!! Do not restart unt ...   . NITROSTAT 0.4 MG SL tablet      PLACE 1 TABLET UNDER THE TONGUE EVERY 5 MINUTES AS NEEDED FOR CHEST PAIN   25 tablet   0   . oxyCODONE (ROXICODONE) 15 MG immediate release tablet   Oral   Take 1 tablet (15 mg total) by mouth every 6 (six) hours as  needed for pain. With limit 4 per day - to fill Mar 23, 2013   120 tablet   0   . traZODone (DESYREL) 50 MG tablet   Oral   Take 2 tablets (100 mg total) by mouth at bedtime.   60 tablet   5   . zolpidem (AMBIEN) 10 MG tablet   Oral   Take 1 tablet (10 mg total) by mouth at bedtime as needed for sleep.          BP 126/97  Pulse 82  Temp(Src) 99.4 F (37.4 C) (Oral)  Resp 20  SpO2 95% Physical Exam  Nursing note and vitals reviewed. Constitutional: He is oriented to person, place, and time. He appears well-developed and well-nourished. No distress.  HENT:  Head: Normocephalic and atraumatic.  Mouth/Throat: Oropharynx is clear and moist. No oropharyngeal exudate.  Negative facial trauma Negative hematoma  Eyes: Conjunctivae and EOM are normal. Pupils are equal, round, and reactive to light. Right eye  exhibits no discharge. Left eye exhibits no discharge.  Negative nystagmus  Neck: Normal range of motion. Neck supple.  Negative neck stiffness Negative nuchal rigidity Negative cervical LAD  Cardiovascular: Normal rate, regular rhythm and normal heart sounds.   Pulses:      Radial pulses are 2+ on the right side, and 2+ on the left side.       Dorsalis pedis pulses are 2+ on the right side, and 2+ on the left side.  Pulmonary/Chest: Effort normal and breath sounds normal. No respiratory distress. He has no wheezes. He has no rales.  Musculoskeletal: Normal range of motion. He exhibits tenderness.  Discomfort upon palpation to the lumbar spine - midspinal region and paraspinal - patient reported this pain is chronic. Negative deformities, swelling, bulging, erythema noted to the spine. Decreased ROM to the right shoulder - decreased abduction secondary to surgery 3 years ago. Collapsing right foot - patient has brace on.   Lymphadenopathy:    He has no cervical adenopathy.  Neurological: He is alert and oriented to person, place, and time. No cranial nerve deficit. He exhibits  normal muscle tone. Coordination normal.  Cranial nerves III-XII grossly intact Strength 5+/5+ to upper and lower extremities bilaterally Straight leg raise normal bilaterally Tremors noted to hand bilaterally Shuffling, unsteady gait - unbalanced Stuttered speech  Skin: Skin is warm and dry. No rash noted. He is not diaphoretic. No erythema.  Psychiatric: He has a normal mood and affect. His behavior is normal. Thought content normal.    ED Course  Procedures (including critical care time)  8:28 PM Discussed case with Dr. Julious Oka - Dr. Julious Oka to see patient and assess.   9:05 PM This provider was informed that patient reported that he is suicidal - that he wants to take all of his Ambien medication and not wake up.   9:30 PM This provider spoke with Dr. Sheran Luz regarding case, labs, imaging - Dr. Sheran Luz reported that he will see the patient.   9:53 PM Discussion with Dr. Sheran Luz and Dr. Julious Oka - Dr. Sheran Luz reported that he would call Neurology, possibly start patient Levadopa. Dr. Julious Oka did not recommend this, since patient will have to be monitored due to having numerous side effects and patient just starting the medication. Dr. Julious Oka recommended that patient be seen by psych - possible stemming from pyschological issues. Dr. Sheran Luz believes it is okay for patient to be discharged.   10:15 PM Dr. Julious Oka spoke with patient, patient reported that he does not feel suicidal. Stated that he does not want to kill himself.   10:36 PM This provider was at patient's bedside. Had a long discussion with patient regarding labs and imaging results. Discussed with patient regarding SI - patient reported that he made a comment regarding taking all of his Ambien when he got home - stated that he has been having SI for the past week. Reported that he does not have SI now, reported that he has a psychiatrist and will consult the psychiatrist tomorrow. Patient has history  of depression, currently on medication regarding this. This provider noticed that patient did not have a single tremor to the right hand or left hand during the interview, patient was calm, flat affect noted.    Date: 03/17/2013  Rate: 59  Rhythm: normal sinus rhythm  QRS Axis: normal  Intervals: normal  ST/T Wave abnormalities: normal  Conduction Disutrbances:none  Narrative Interpretation:  Old EKG Reviewed: unchanged EKG analyzed and reviewed by this provider and attending physician.   Labs Review Labs Reviewed  CBC WITH DIFFERENTIAL - Abnormal; Notable for the following:    RBC 4.03 (*)    Hemoglobin 12.6 (*)    HCT 36.5 (*)    All other components within normal limits  COMPREHENSIVE METABOLIC PANEL - Abnormal; Notable for the following:    GFR calc non Af Amer 89 (*)    All other components within normal limits  POCT I-STAT TROPONIN I   Imaging Review Dg Cervical Spine Complete  03/17/2013   CLINICAL DATA:  Neck pain post fall, bilateral shoulder pain  EXAM: CERVICAL SPINE  4+ VIEWS  COMPARISON:  None  FINDINGS: Prevertebral soft tissues normal thickness.  Vertebral body heights maintained.  Scattered disc space narrowing and minimal endplate spur formation cervical spine.  Small uncovertebral spurs encroach upon bilateral C3-C4 and right C6-C7 neural foramina.  Multilevel facet degenerative changes.  Lung apices clear.  No acute fracture, subluxation or bone destruction. C1-C2 alignment normal, with odontoid process tip obscured by skull base.  IMPRESSION: Degenerative disc and facet disease changes of the cervical spine as above.  No acute abnormalities.   Electronically Signed   By: Lavonia Dana M.D.   On: 03/17/2013 20:15   Dg Shoulder Right  03/17/2013   CLINICAL DATA:  Bilateral shoulder pain secondary to a fall. Limited range of motion.  EXAM: RIGHT SHOULDER - 2+ VIEW  COMPARISON:  Chest x-ray dated 10/20/2011 and MRI of the shoulder dated 01/03/2010  FINDINGS: There is no  fracture or dislocation. There are degenerative arthritic changes as well as post surgical changes at the shoulder.  IMPRESSION: No acute abnormality.   Electronically Signed   By: Rozetta Nunnery M.D.   On: 03/17/2013 20:20   Ct Head Wo Contrast  03/17/2013   CLINICAL DATA:  Status post fall today, tremors.  EXAM: CT HEAD WITHOUT CONTRAST  TECHNIQUE: Contiguous axial images were obtained from the base of the skull through the vertex without intravenous contrast.  COMPARISON:  None.  FINDINGS: There is no midline shift, hydrocephalus, or mass. No acute hemorrhage or acute transcortical infarct is identified. The bony calvarium is intact. There is minimal mucoperiosteal thickening of bilateral maxillary and bilateral ethmoid sinuses.  IMPRESSION: No focal acute intracranial abnormality identified.   Electronically Signed   By: Abelardo Diesel M.D.   On: 03/17/2013 19:48   Dg Shoulder Left  03/17/2013   CLINICAL DATA:  Left shoulder pain secondary to a fall.  EXAM: LEFT SHOULDER - 2+ VIEW  COMPARISON:  Chest x-ray dated 02/07/2013  FINDINGS: There is moderate osteoarthritis of the glenohumeral joint with what appear to be postsurgical changes at the acromion and distal clavicle. Calcification in the distal rotator cuff. No acute abnormalities.  IMPRESSION: Degenerative and postoperative changes as described. No acute abnormality.   Electronically Signed   By: Rozetta Nunnery M.D.   On: 03/17/2013 20:21    EKG Interpretation     Ventricular Rate:  59 PR Interval:  168 QRS Duration: 122 QT Interval:  432 QTC Calculation: 428 R Axis:   29 Text Interpretation:  Normal sinus rhythm            MDM   1. Fall, initial encounter   2. Shakes   3. Depression    Medications  sodium chloride 0.9 % bolus 1,000 mL (0 mLs Intravenous Stopped 03/17/13 2230)   Filed Vitals:   03/17/13 1817  BP: 126/97  Pulse: 82  Temp: 99.4 F (37.4 C)  Resp: 20    Patient presenting to the ED with recurrent  falls and dizziness and shaking that has been ongoing for the past 7 months. As per patient reported that the falls and dizziness have gotten worse over the course of the weekend. Patient reported that from Friday to today he has fallen at least 14-15 times in his home - stated that he lives alone.  Alert and oriented. Patient follows commands, responds to questions appropriately. Stutters identified with speech. Shaking tremors identified to bilateral hands-right more predominant than left. Gait unsteady, poor balance identified. Strength 5+/5+ to upper and lower extremities bilaterally, equal distribution identified. Negative nystagmus. Cranial nerves III through XII grossly intact. Lungs clear to auscultation bilaterally to upper and lower lobes. Heart rate and rhythm normal. Full range of motion to upper and lower extremities bilaterally-decreased range of motion to the right shoulder, this is chronic, has been ongoing for the past 3 years since patient shoulder surgery performed, decreased abduction. EKG negative ischemic findings, negative new findings identified. Negative elevation of troponin. CBC negative findings. CMP negative findings. Plain film of cervical spine noted degenerative disc disease as well as degenerative disc disease, negative acute findings. Plain film of right shoulder negative acute findings. Plain film of left shoulder degenerative and postoperative changes identified, negative acute abnormalities. CT head without contrast negative acute intracranial abnormalities identified. Patient reported at first while in the ED setting that when he got discharged that he was going to take all of his Ambien when he got home - stated that he was having SI. This provider and attending physician both discussed with the patient and had a sit down conversation - patient denied SI to both this provider and attending physician. Patient reported that he will see his psychiatrist in the morning. Patient  denied SI and HI.  Patient stable, afebrile. Definitive etiology of tremors/shakes unknown. Patient currently being worked up for PD with PCP. Could possibly be psychological in nature. Patient has an appointment scheduled with neurology on 03/24/2013 - patient scheduled for MRI on 03/21/2013. Discharged patient. Discussed with patient to rest and stay hydrated. Discussed with patient that he needs to follow up with his PCP and will most likely need to get PT on board and started in order to get stronger. Discussed with patient to closely monitor symptoms and if symptoms are to worsen or change to report back to the ED - strict return instructions given.  Patient agreed to plan of care, understood, all questions answered.       Jamse Mead, PA-C 03/18/13 1501

## 2013-03-17 NOTE — ED Notes (Signed)
Bed: WN05 Expected date:  Expected time:  Means of arrival:  Comments: tremors

## 2013-03-17 NOTE — Progress Notes (Signed)
Patient ID: Travis [name], male   DOB: 1950-10-24, 62 y.o.   MRN: [id number] Pt reviewed and interviewed today, states he is doing better. Reports that he is trying to cut back on his expenses in order to make ends meet and that he has moved back into his house. He reports that his sleep and appetite are good mood is improving and he is less anxious. Patient has an appointment scheduled to see a neurologist. Denies suicidal or homicidal ideation and has no hallucinations or delusions. Is tolerating his medications well and his coping much better

## 2013-03-17 NOTE — Progress Notes (Signed)
    Daily Group Progress Note  Program: IOP  Group Time: 9:00-10:30 am   Participation Level: Active  Behavioral Response: Appropriate  Type of Therapy:  Process Group  Summary of Progress: Pt continues to engage in manipulative behavior. Pt is aware that he connects with women in relationships but struggles to see how he takes their money and this has been the cause of much of his legal troubles. Pt became very tearful in group when talking about past losses in his life. Pt has been informed not to contact group members outside of group and not to take money from members. Pt became passive aggressive in making a comment to "take his Klonopin" when he gets home. Pt was assessed for safety and contracted.      Group Time: 10:30 am - 12:00 pm   Participation Level:  Active  Behavioral Response: Appropriate  Type of Therapy: Psycho-education Group  Summary of Progress: Pt participated in a group with a focus on grief and loss and identified losses impacting overall wellness and effective grieving strategies.   Tresa Res, LCSW

## 2013-03-18 ENCOUNTER — Other Ambulatory Visit (HOSPITAL_COMMUNITY): Payer: Medicare HMO

## 2013-03-18 ENCOUNTER — Encounter (HOSPITAL_COMMUNITY): Payer: Self-pay | Admitting: Emergency Medicine

## 2013-03-18 ENCOUNTER — Emergency Department (HOSPITAL_COMMUNITY)
Admission: EM | Admit: 2013-03-18 | Discharge: 2013-03-18 | Disposition: A | Discharge status: Home / Self Care | Payer: Medicare HMO | Attending: Emergency Medicine | Admitting: Emergency Medicine

## 2013-03-18 ENCOUNTER — Ambulatory Visit (HOSPITAL_COMMUNITY)
Admission: RE | Admit: 2013-03-18 | Discharge: 2013-03-18 | Disposition: A | Discharge status: Home / Self Care | Payer: Medicare HMO | Attending: Psychiatry | Admitting: Psychiatry

## 2013-03-18 DIAGNOSIS — Z79899 Other long term (current) drug therapy: Secondary | ICD-10-CM | POA: Insufficient documentation

## 2013-03-18 DIAGNOSIS — E782 Mixed hyperlipidemia: Secondary | ICD-10-CM | POA: Insufficient documentation

## 2013-03-18 DIAGNOSIS — G4733 Obstructive sleep apnea (adult) (pediatric): Secondary | ICD-10-CM | POA: Insufficient documentation

## 2013-03-18 DIAGNOSIS — Z87891 Personal history of nicotine dependence: Secondary | ICD-10-CM | POA: Insufficient documentation

## 2013-03-18 DIAGNOSIS — G8929 Other chronic pain: Secondary | ICD-10-CM | POA: Insufficient documentation

## 2013-03-18 DIAGNOSIS — F332 Major depressive disorder, recurrent severe without psychotic features: Secondary | ICD-10-CM

## 2013-03-18 DIAGNOSIS — Z7982 Long term (current) use of aspirin: Secondary | ICD-10-CM | POA: Insufficient documentation

## 2013-03-18 DIAGNOSIS — Z8709 Personal history of other diseases of the respiratory system: Secondary | ICD-10-CM | POA: Insufficient documentation

## 2013-03-18 DIAGNOSIS — R4182 Altered mental status, unspecified: Secondary | ICD-10-CM | POA: Insufficient documentation

## 2013-03-18 DIAGNOSIS — R45851 Suicidal ideations: Secondary | ICD-10-CM | POA: Insufficient documentation

## 2013-03-18 DIAGNOSIS — I2510 Atherosclerotic heart disease of native coronary artery without angina pectoris: Secondary | ICD-10-CM | POA: Insufficient documentation

## 2013-03-18 DIAGNOSIS — Z8739 Personal history of other diseases of the musculoskeletal system and connective tissue: Secondary | ICD-10-CM | POA: Insufficient documentation

## 2013-03-18 DIAGNOSIS — F32A Depression, unspecified: Secondary | ICD-10-CM

## 2013-03-18 DIAGNOSIS — Z87448 Personal history of other diseases of urinary system: Secondary | ICD-10-CM | POA: Insufficient documentation

## 2013-03-18 DIAGNOSIS — IMO0002 Reserved for concepts with insufficient information to code with codable children: Secondary | ICD-10-CM | POA: Insufficient documentation

## 2013-03-18 DIAGNOSIS — Z87442 Personal history of urinary calculi: Secondary | ICD-10-CM | POA: Insufficient documentation

## 2013-03-18 DIAGNOSIS — F3289 Other specified depressive episodes: Secondary | ICD-10-CM | POA: Insufficient documentation

## 2013-03-18 DIAGNOSIS — F329 Major depressive disorder, single episode, unspecified: Secondary | ICD-10-CM | POA: Insufficient documentation

## 2013-03-18 DIAGNOSIS — E669 Obesity, unspecified: Secondary | ICD-10-CM | POA: Insufficient documentation

## 2013-03-18 DIAGNOSIS — F411 Generalized anxiety disorder: Secondary | ICD-10-CM | POA: Insufficient documentation

## 2013-03-18 DIAGNOSIS — I10 Essential (primary) hypertension: Secondary | ICD-10-CM | POA: Insufficient documentation

## 2013-03-18 LAB — ETHANOL: Alcohol, Ethyl (B): <11 mg/dL (ref 0–11)

## 2013-03-18 LAB — RAPID URINE DRUG SCREEN, HOSP PERFORMED
Amphetamines: NOT DETECTED
Barbiturates: NOT DETECTED
Benzodiazepines: POSITIVE — AB
Cocaine: NOT DETECTED
Tetrahydrocannabinol: NOT DETECTED

## 2013-03-18 LAB — CBC
HCT: 40.1 % (ref 39.0–52.0)
Hemoglobin: 13.6 g/dL (ref 13.0–17.0)
MCHC: 33.9 g/dL (ref 30.0–36.0)
MCV: 91.8 fL (ref 78.0–100.0)
RBC: 4.37 MIL/uL (ref 4.22–5.81)
RDW: 13.4 % (ref 11.5–15.5)
WBC: 5.3 10*3/uL (ref 4.0–10.5)

## 2013-03-18 LAB — COMPREHENSIVE METABOLIC PANEL
Albumin: 4.1 g/dL (ref 3.5–5.2)
Alkaline Phosphatase: 70 U/L (ref 39–117)
BUN: 17 mg/dL (ref 6–23)
CO2: 22 mEq/L (ref 19–32)
Chloride: 107 mEq/L (ref 96–112)
Creatinine, Ser: 0.94 mg/dL (ref 0.50–1.35)
Potassium: 4.2 mEq/L (ref 3.5–5.1)
Total Bilirubin: 0.4 mg/dL (ref 0.3–1.2)

## 2013-03-18 LAB — ACETAMINOPHEN LEVEL: Acetaminophen (Tylenol), Serum: <15 ug/mL (ref 10–30)

## 2013-03-18 MED ORDER — IBUPROFEN 200 MG PO TABS: 400.0000 mg | ORAL_TABLET | Freq: Four times a day (QID) | ORAL | Status: DC | PRN

## 2013-03-18 MED ORDER — NITROGLYCERIN 0.4 MG SL SUBL: 0.4000 mg | SUBLINGUAL_TABLET | SUBLINGUAL | Status: DC | PRN

## 2013-03-18 MED ORDER — ZOLPIDEM TARTRATE 10 MG PO TABS: 10.0000 mg | ORAL_TABLET | Freq: Every evening | ORAL | Status: DC | PRN

## 2013-03-18 MED ORDER — TRAZODONE HCL 100 MG PO TABS: 100.0000 mg | ORAL_TABLET | Freq: Every day | ORAL | Status: DC

## 2013-03-18 MED ORDER — FLUOXETINE HCL 20 MG PO CAPS
20.0000 mg | ORAL_CAPSULE | Freq: Every day | ORAL | Status: DC
  Filled 2013-03-18: qty 1

## 2013-03-18 MED ORDER — AMLODIPINE BESYLATE 5 MG PO TABS
5.0000 mg | ORAL_TABLET | Freq: Every day | ORAL | Status: DC
  Filled 2013-03-18: qty 1

## 2013-03-18 MED ORDER — OXYCODONE HCL 5 MG PO TABS: 15.0000 mg | ORAL_TABLET | Freq: Four times a day (QID) | ORAL | Status: DC | PRN

## 2013-03-18 MED ORDER — NITROGLYCERIN 0.3 MG SL SUBL: 0.3000 mg | SUBLINGUAL_TABLET | SUBLINGUAL | Status: DC | PRN

## 2013-03-18 MED ORDER — PRASUGREL HCL 10 MG PO TABS
10.0000 mg | ORAL_TABLET | Freq: Every day | ORAL | Status: DC
  Filled 2013-03-18: qty 1

## 2013-03-18 MED ORDER — ASPIRIN 81 MG PO CHEW: 81.0000 mg | CHEWABLE_TABLET | Freq: Every day | ORAL | Status: DC

## 2013-03-18 MED ORDER — FLUTICASONE PROPIONATE 50 MCG/ACT NA SUSP
1.0000 | Freq: Every day | NASAL | Status: DC
  Filled 2013-03-18: qty 16

## 2013-03-18 MED ORDER — GABAPENTIN 400 MG PO CAPS
800.0000 mg | ORAL_CAPSULE | Freq: Four times a day (QID) | ORAL | Status: DC
  Filled 2013-03-18 (×3): qty 2

## 2013-03-18 MED ORDER — ATORVASTATIN CALCIUM 20 MG PO TABS
20.0000 mg | ORAL_TABLET | Freq: Every day | ORAL | Status: DC
  Filled 2013-03-18: qty 1

## 2013-03-18 MED ORDER — BUPROPION HCL ER (XL) 300 MG PO TB24
450.0000 mg | ORAL_TABLET | Freq: Every day | ORAL | Status: DC
  Filled 2013-03-18: qty 1

## 2013-03-18 MED ORDER — CLONAZEPAM 0.5 MG PO TABS: 1.0000 mg | ORAL_TABLET | Freq: Three times a day (TID) | ORAL | Status: DC | PRN

## 2013-03-18 MED ORDER — ISOSORBIDE MONONITRATE ER 60 MG PO TB24
90.0000 mg | ORAL_TABLET | Freq: Every day | ORAL | Status: DC
  Filled 2013-03-18: qty 1

## 2013-03-18 MED ORDER — METFORMIN HCL 500 MG PO TABS
500.0000 mg | ORAL_TABLET | Freq: Two times a day (BID) | ORAL | Status: DC
  Filled 2013-03-18 (×2): qty 1

## 2013-03-18 MED ORDER — BUPROPION HCL ER (XL) 150 MG PO TB24: 150.0000 mg | ORAL_TABLET | Freq: Every day | ORAL | Status: DC

## 2013-03-18 MED ORDER — BUPROPION HCL ER (XL) 300 MG PO TB24: 300.0000 mg | ORAL_TABLET | Freq: Every day | ORAL | Status: DC

## 2013-03-18 MED ORDER — CYCLOBENZAPRINE HCL 10 MG PO TABS: 5.0000 mg | ORAL_TABLET | Freq: Three times a day (TID) | ORAL | Status: DC | PRN

## 2013-03-18 MED ORDER — LORAZEPAM 1 MG PO TABS: 1.0000 mg | ORAL_TABLET | ORAL | Status: DC | PRN

## 2013-03-18 MED ORDER — BENAZEPRIL HCL 20 MG PO TABS
20.0000 mg | ORAL_TABLET | Freq: Every day | ORAL | Status: DC
  Filled 2013-03-18: qty 1

## 2013-03-18 NOTE — ED Notes (Signed)
Patient still has his shoes due to a orthopedic brace he needs to ambulate. RN Morey Hummingbird is aware.

## 2013-03-18 NOTE — BH Assessment (Signed)
Pt presented to [facility] as a walk-in. Ran patient by Waylan Boga, NP and medical clearance was recommended. Pt sent to [facility] for clearance.   Patient was not accepted not denied for admission to [facility]. This will be determined once patient is medically cleared.

## 2013-03-18 NOTE — ED Provider Notes (Signed)
CSN: [id number]     Arrival date & time 03/18/13  1115 History   First MD Initiated Contact with Patient 03/18/13 1256     Chief Complaint  Patient presents with  . medical clearance    (Consider location/radiation/quality/duration/timing/severity/associated sxs/prior Treatment) Patient is a 62 y.o. male presenting with altered mental status. The history is provided by the patient (the pt states he told behavior health today that he wanted to kill himself).  Altered Mental Status Presenting symptoms: no combativeness   Severity:  Severe Most recent episode:  Yesterday Episode history:  Continuous Timing:  Constant Progression:  Waxing and waning Associated symptoms: no abdominal pain, no hallucinations, no headaches, no rash and no seizures     Past Medical History  Diagnosis Date  . Impaired glucose tolerance 09/27/2010  . HYPERLIPIDEMIA-MIXED 07/15/2009  . Chronic pain syndrome 10/27/2009  . HYPERTENSION 06/24/2009  . CORONARY ARTERY DISEASE 06/24/2009    a. s/p multiple PCIs - In 2008 he had a Taxus DES to the mild LAD, Endeavor DES to mid LCX and distal LCX. In January 2009 he had DES to distal LCX, mid LCX and proximal LCX. In November 2009 had BMS x 2 to the mid RCA. Cath 10/2011 with patent stents, noncardiac CP. LHC 01/2013: patent stents (noncardiac CP).  [name] URETHRAL STRICTURE 06/24/2009  . DEGENERATIVE JOINT DISEASE 06/24/2009  . SHOULDER PAIN, BILATERAL 06/24/2009  . ANKLE PAIN, RIGHT 06/24/2009  . [place] DISEASE, LUMBAR 04/19/2010  . SCIATICA, LEFT 04/19/2010  . NEPHROLITHIASIS, HX OF 06/24/2009  . ERECTILE DYSFUNCTION, ORGANIC 05/30/2010  . OSA (obstructive sleep apnea)     not using CPAP very often  . Obesity   . Allergy to clopidogrel   . ALLERGIC RHINITIS 06/24/2009  . Anxiety   . Depression     Prior suicide attempt   Past Surgical History  Procedure Laterality Date  . Tonsillectomy    . Rotator cuff repair      bilateral  . S/p knee replacement  2008  . S/p bilat wrist  surgury    . S/p urethral dilations,  multi 2009,2010  . Carpal tunnel release    . S/p right knee arthroscopy  03/2005    and multi prior  . S/p umbilical hernia    . S/p left knee arthroscopy  june 2002    multi prior  . S/p right shoulder rotater cuff surgury  april 2011    Dr. [name] Lighter   Family History  Problem Relation Age of Onset  . Depression Mother   . Heart disease Mother   . Hypertension Mother   . Cancer Mother     Breast  . Diabetes Father   . Heart disease Father     CABG  . Cancer Father     prostate and skin cancer  . Hypertension Father   . Hyperlipidemia Father   . Depression Brother     x 2  . Hypertension Brother     x2  . Coronary artery disease Other   . Hypertension Other   . Depression Other   . Heart disease Maternal Grandfather   . Early death Maternal Grandfather 59    heart attack  . Early death Paternal Grandfather    History  Substance Use Topics  . Smoking status: Former Smoker    Types: Cigars    Quit date: 08/28/2010  . Smokeless tobacco: Never Used     Comment: rare  . Alcohol Use: 0.0 oz/week     Comment:  occasional    Review of Systems  Constitutional: Negative for appetite change and fatigue.  HENT: Negative for congestion, ear discharge and sinus pressure.   Eyes: Negative for discharge.  Respiratory: Negative for cough.   Cardiovascular: Negative for chest pain.  Gastrointestinal: Negative for abdominal pain and diarrhea.  Genitourinary: Negative for frequency and hematuria.  Musculoskeletal: Negative for back pain.  Skin: Negative for rash.  Neurological: Negative for seizures and headaches.  Psychiatric/Behavioral: Positive for suicidal ideas. Negative for hallucinations.    Allergies  Review of patient's allergies indicates no known allergies.  Home Medications   Current Outpatient Rx  Name  Route  Sig  Dispense  Refill  . amLODipine (NORVASC) 5 MG tablet   Oral   Take 5 mg by mouth daily.         [name]  aspirin 81 MG tablet   Oral   Take 1 tablet (81 mg total) by mouth daily.   30 tablet   0   . atorvastatin (LIPITOR) 20 MG tablet   Oral   Take 20 mg by mouth at bedtime.         . benazepril (LOTENSIN) 20 MG tablet   Oral   Take 20 mg by mouth at bedtime.         [name] buPROPion (WELLBUTRIN XL) 150 MG 24 hr tablet   Oral   Take 150 mg by mouth daily. Take with 300 mg to equal 450 mg         . buPROPion (WELLBUTRIN XL) 300 MG 24 hr tablet   Oral   Take 300 mg by mouth daily.         . clonazePAM (KLONOPIN) 1 MG tablet   Oral   Take 1 tablet (1 mg total) by mouth 3 (three) times daily as needed for anxiety.   90 tablet   2   . cyclobenzaprine (FLEXERIL) 5 MG tablet   Oral   Take 5 mg by mouth 3 (three) times daily as needed for muscle spasms.         [name] EFFIENT 10 MG TABS      TAKE 1 TABLET BY MOUTH DAILY   30 tablet   3   . FLUoxetine (PROZAC) 20 MG capsule   Oral   Take 20 mg by mouth daily. Take with 40 mg to equal 44m until 40 mg runs out         . fluticasone (FLONASE) 50 MCG/ACT nasal spray      PLACE 1 SPRAY INTO EACH NOSTRIL DAILY   16 g   11   . gabapentin (NEURONTIN) 400 MG capsule   Oral   Take 800 mg by mouth 4 (four) times daily.         .Marland Kitchenibuprofen (ADVIL,MOTRIN) 200 MG tablet   Oral   Take 400 mg by mouth every 6 (six) hours as needed for pain.         . isosorbide mononitrate (IMDUR) 60 MG 24 hr tablet   Oral   Take 90 mg by mouth daily.         . metFORMIN (GLUCOPHAGE) 500 MG tablet   Oral   Take 1 tablet (500 mg total) by mouth 2 (two) times daily with a meal.           !!!!!!!!!!!!!!!!!!!!!!!!!!!!!!! Do not restart unt ...   . NITROSTAT 0.4 MG SL tablet      PLACE 1 TABLET UNDER THE TONGUE EVERY 5 MINUTES AS NEEDED  FOR CHEST PAIN   25 tablet   0   . oxyCODONE (ROXICODONE) 15 MG immediate release tablet   Oral   Take 1 tablet (15 mg total) by mouth every 6 (six) hours as needed for pain. With limit 4 per day -  to fill Mar 23, 2013   120 tablet   0   . traZODone (DESYREL) 50 MG tablet   Oral   Take 2 tablets (100 mg total) by mouth at bedtime.   60 tablet   5   . zolpidem (AMBIEN) 10 MG tablet   Oral   Take 1 tablet (10 mg total) by mouth at bedtime as needed for sleep.          There were no vitals taken for this visit. Physical Exam  Constitutional: He is oriented to person, place, and time. He appears well-developed.  HENT:  Head: Normocephalic.  Eyes: Conjunctivae and EOM are normal. No scleral icterus.  Neck: Neck supple. No thyromegaly present.  Cardiovascular: Normal rate and regular rhythm.  Exam reveals no gallop and no friction rub.   No murmur heard. Pulmonary/Chest: No stridor. He has no wheezes. He has no rales. He exhibits no tenderness.  Abdominal: He exhibits no distension. There is no tenderness. There is no rebound.  Musculoskeletal: Normal range of motion. He exhibits no edema.  Lymphadenopathy:    He has no cervical adenopathy.  Neurological: He is oriented to person, place, and time. He exhibits normal muscle tone. Coordination normal.  Skin: No rash noted. No erythema.  Psychiatric:  Pt depressed and states now he is not suicidal,  But this am he was suicidal    ED Course  Procedures (including critical care time) Labs Review Labs Reviewed  URINE RAPID DRUG SCREEN (HOSP PERFORMED) - Abnormal; Notable for the following:    Benzodiazepines POSITIVE (*)    All other components within normal limits  CBC  ACETAMINOPHEN LEVEL  COMPREHENSIVE METABOLIC PANEL  ETHANOL  SALICYLATE LEVEL   Imaging Review Dg Cervical Spine Complete  03/17/2013   CLINICAL DATA:  Neck pain post fall, bilateral shoulder pain  EXAM: CERVICAL SPINE  4+ VIEWS  COMPARISON:  None  FINDINGS: Prevertebral soft tissues normal thickness.  Vertebral body heights maintained.  Scattered disc space narrowing and minimal endplate spur formation cervical spine.  Small uncovertebral spurs encroach  upon bilateral C3-C4 and right C6-C7 neural foramina.  Multilevel facet degenerative changes.  Lung apices clear.  No acute fracture, subluxation or bone destruction. C1-C2 alignment normal, with odontoid process tip obscured by skull base.  IMPRESSION: Degenerative disc and facet disease changes of the cervical spine as above.  No acute abnormalities.   Electronically Signed   By: Lavonia Dana M.D.   On: 03/17/2013 20:15   Dg Shoulder Right  03/17/2013   CLINICAL DATA:  Bilateral shoulder pain secondary to a fall. Limited range of motion.  EXAM: RIGHT SHOULDER - 2+ VIEW  COMPARISON:  Chest x-ray dated 10/20/2011 and MRI of the shoulder dated 01/03/2010  FINDINGS: There is no fracture or dislocation. There are degenerative arthritic changes as well as post surgical changes at the shoulder.  IMPRESSION: No acute abnormality.   Electronically Signed   By: Rozetta Nunnery M.D.   On: 03/17/2013 20:20   Ct Head Wo Contrast  03/17/2013   CLINICAL DATA:  Status post fall today, tremors.  EXAM: CT HEAD WITHOUT CONTRAST  TECHNIQUE: Contiguous axial images were obtained from the base of the skull through the  vertex without intravenous contrast.  COMPARISON:  None.  FINDINGS: There is no midline shift, hydrocephalus, or mass. No acute hemorrhage or acute transcortical infarct is identified. The bony calvarium is intact. There is minimal mucoperiosteal thickening of bilateral maxillary and bilateral ethmoid sinuses.  IMPRESSION: No focal acute intracranial abnormality identified.   Electronically Signed   By: Abelardo Diesel M.D.   On: 03/17/2013 19:48   Dg Shoulder Left  03/17/2013   CLINICAL DATA:  Left shoulder pain secondary to a fall.  EXAM: LEFT SHOULDER - 2+ VIEW  COMPARISON:  Chest x-ray dated 02/07/2013  FINDINGS: There is moderate osteoarthritis of the glenohumeral joint with what appear to be postsurgical changes at the acromion and distal clavicle. Calcification in the distal rotator cuff. No acute  abnormalities.  IMPRESSION: Degenerative and postoperative changes as described. No acute abnormality.   Electronically Signed   By: Rozetta Nunnery M.D.   On: 03/17/2013 20:21    EKG Interpretation   None     pt depressed.  Behavior health to evaluate  MDM  No diagnosis found.     Maudry Diego, MD 03/18/13 1316

## 2013-03-18 NOTE — Patient Instructions (Signed)
Patient requested inpatient care.  Pt encouraged to go upstairs for telepsych assessment.  After inpatient care, it is recommended that pt go to a partial program for continued stablization.  Follow up with Dr. Cheryln Manly.

## 2013-03-18 NOTE — ED Provider Notes (Signed)
Medical screening examination/treatment/procedure(s) were conducted as a shared visit with non-physician practitioner(s) and myself.  I personally evaluated the patient during the encounter.  EKG Interpretation     Ventricular Rate:  59 PR Interval:  168 QRS Duration: 122 QT Interval:  432 QTC Calculation: 428 R Axis:   29 Text Interpretation:  Normal sinus rhythm             Patient here with some concerns for tremor weakness. Increasing multiple falls of late. Patient is having outpatient workup for Parkinson's. Patient weak and has trouble walking with the PA who saw him today. Labs are normal here to the hospital is he cannot admit for PT. He also spoke with the patient initially reported some mild suicidality. He thought he was getting admitted and asked to be sent today relatively her down with him as an inpatient. We found out he cannot be admitted to the hospital, he stated that he is okay and is receiving intensive outpatient therapy. He denies suicidality while he is eating his dinner here in the bed. With his history and he has not had any recent notes of falls per recent PCP visits in the past 2 weeks. He does have an extremely complicated psych history which may explain some of his falling and weakness.. Instructed to followup with PCP as directed.  Osvaldo Shipper, MD 03/18/13 [id number]
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
## 2013-03-18 NOTE — ED Notes (Addendum)
Patient wanded by security. His belongings are in [place] locker [address]

## 2013-03-18 NOTE — ED Notes (Signed)
Pt states that he has been having trouble with balance walking and falling and absolutely no strength left.  Pt states that he has a MRI on Friday to see what is effecting he balance. Pt states that he told the EDP last night that if has Parkinson's then he is going to go home and drink a bottle of Shearon Stalls and take a bottle of Ambien.  Pt states he went to [facility] today for help on his thoughts and was sent here for medical clearance.

## 2013-03-19 ENCOUNTER — Other Ambulatory Visit (HOSPITAL_COMMUNITY): Payer: Medicare HMO

## 2013-03-19 ENCOUNTER — Ambulatory Visit: Payer: Self-pay | Admitting: Internal Medicine

## 2013-03-19 NOTE — Progress Notes (Signed)
Discharge Note  Patient:  Travis [name] is an 62 y.o., male DOB:  1951-05-12  Date of Admission:  02-21-13  Date of Discharge:  03-18-13  Reason for Admission: Depression and anxiety  Hospital Course: Patient was admitted to IOP and was continued on his medications. He had multiple stressors which included financial stressors and legal problems. Patient felt overwhelmed with the spike was able to talk about them. His sleep and appetite were good and mood was anxious. He denied suicidal or homicidal ideation and had no hallucinations or delusions. He was coping well and tolerating his medications well  Mental Status at Discharge: Alert, oriented x3, affect was anxious mood was stable with no suicidal or homicidal ideation no hallucinations or delusions were noted. Recent and remote memory was good, judgment and insight were good, concentration was fair recall was good.  Lab Results:  Results for orders placed during the hospital encounter of 03/18/13 (from the past 48 hour(s))  URINE RAPID DRUG SCREEN (HOSP PERFORMED)     Status: Abnormal   Collection Time    03/18/13 12:27 PM      Result Value Range   Opiates NONE DETECTED  NONE DETECTED   Cocaine NONE DETECTED  NONE DETECTED   Benzodiazepines POSITIVE (*) NONE DETECTED   Amphetamines NONE DETECTED  NONE DETECTED   Tetrahydrocannabinol NONE DETECTED  NONE DETECTED   Barbiturates NONE DETECTED  NONE DETECTED   Comment:            DRUG SCREEN FOR MEDICAL PURPOSES     ONLY.  IF CONFIRMATION IS NEEDED     FOR ANY PURPOSE, NOTIFY LAB     WITHIN 5 DAYS.                LOWEST DETECTABLE LIMITS     FOR URINE DRUG SCREEN     Drug Class       Cutoff (ng/mL)     Amphetamine      1000     Barbiturate      200     Benzodiazepine   213     Tricyclics       086     Opiates          300     Cocaine          300     THC              50  ACETAMINOPHEN LEVEL     Status: None   Collection Time    03/18/13 12:44 PM      Result Value Range    Acetaminophen (Tylenol), Serum <15.0  10 - 30 ug/mL   Comment:            THERAPEUTIC CONCENTRATIONS VARY     SIGNIFICANTLY. A RANGE OF 10-30     ug/mL MAY BE AN EFFECTIVE     CONCENTRATION FOR MANY PATIENTS.     HOWEVER, SOME ARE BEST TREATED     AT CONCENTRATIONS OUTSIDE THIS     RANGE.     ACETAMINOPHEN CONCENTRATIONS     >150 ug/mL AT 4 HOURS AFTER     INGESTION AND >50 ug/mL AT 12     HOURS AFTER INGESTION ARE     OFTEN ASSOCIATED WITH TOXIC     REACTIONS.  CBC     Status: None   Collection Time    03/18/13 12:44 PM      Result Value Range   WBC 5.3  4.0 - 10.5 K/uL   RBC 4.37  4.22 - 5.81 MIL/uL   Hemoglobin 13.6  13.0 - 17.0 g/dL   HCT 40.1  39.0 - 52.0 %   MCV 91.8  78.0 - 100.0 fL   MCH 31.1  26.0 - 34.0 pg   MCHC 33.9  30.0 - 36.0 g/dL   RDW 13.4  11.5 - 15.5 %   Platelets 233  150 - 400 K/uL  COMPREHENSIVE METABOLIC PANEL     Status: Abnormal   Collection Time    03/18/13 12:44 PM      Result Value Range   Sodium 139  135 - 145 mEq/L   Potassium 4.2  3.5 - 5.1 mEq/L   Chloride 107  96 - 112 mEq/L   CO2 22  19 - 32 mEq/L   Glucose, Bld 98  70 - 99 mg/dL   BUN 17  6 - 23 mg/dL   Creatinine, Ser 0.94  0.50 - 1.35 mg/dL   Calcium 9.5  8.4 - 10.5 mg/dL   Total Protein 7.3  6.0 - 8.3 g/dL   Albumin 4.1  3.5 - 5.2 g/dL   AST 20  0 - 37 U/L   ALT 20  0 - 53 U/L   Alkaline Phosphatase 70  39 - 117 U/L   Total Bilirubin 0.4  0.3 - 1.2 mg/dL   GFR calc non Af Amer 89 (*) >90 mL/min   GFR calc Af Amer >90  >90 mL/min   Comment: (NOTE)     The eGFR has been calculated using the CKD EPI equation.     This calculation has not been validated in all clinical situations.     eGFR's persistently <90 mL/min signify possible Chronic Kidney     Disease.  ETHANOL     Status: None   Collection Time    03/18/13 12:44 PM      Result Value Range   Alcohol, Ethyl (B) <11  0 - 11 mg/dL   Comment:            LOWEST DETECTABLE LIMIT FOR     SERUM ALCOHOL IS 11 mg/dL      FOR MEDICAL PURPOSES ONLY  SALICYLATE LEVEL     Status: Abnormal   Collection Time    03/18/13 12:44 PM      Result Value Range   Salicylate Lvl <3.5 (*) 2.8 - 20.0 mg/dL    Current outpatient prescriptions:amLODipine (NORVASC) 5 MG tablet, Take 5 mg by mouth daily., Disp: , Rfl: ;  aspirin 81 MG tablet, Take 1 tablet (81 mg total) by mouth daily., Disp: 30 tablet, Rfl: 0;  atorvastatin (LIPITOR) 20 MG tablet, Take 20 mg by mouth at bedtime., Disp: , Rfl: ;  benazepril (LOTENSIN) 20 MG tablet, Take 20 mg by mouth at bedtime., Disp: , Rfl:  buPROPion (WELLBUTRIN XL) 150 MG 24 hr tablet, Take 150 mg by mouth daily. Take with 300 mg to equal 450 mg, Disp: , Rfl: ;  buPROPion (WELLBUTRIN XL) 300 MG 24 hr tablet, Take 300 mg by mouth daily., Disp: , Rfl: ;  clonazePAM (KLONOPIN) 1 MG tablet, Take 1 tablet (1 mg total) by mouth 3 (three) times daily as needed for anxiety., Disp: 90 tablet, Rfl: 2 cyclobenzaprine (FLEXERIL) 5 MG tablet, Take 5 mg by mouth 3 (three) times daily as needed for muscle spasms., Disp: , Rfl: ;  EFFIENT 10 MG TABS, TAKE 1 TABLET BY MOUTH DAILY, Disp: 30 tablet, Rfl: 3;  FLUoxetine (PROZAC) 20 MG capsule, Take 20 mg by mouth daily. Take with 40 mg to equal 82m until 40 mg runs out, Disp: , Rfl: ;  fluticasone (FLONASE) 50 MCG/ACT nasal spray, PLACE 1 SPRAY INTO EACH NOSTRIL DAILY, Disp: 16 g, Rfl: 11 gabapentin (NEURONTIN) 400 MG capsule, Take 800 mg by mouth 4 (four) times daily., Disp: , Rfl: ;  ibuprofen (ADVIL,MOTRIN) 200 MG tablet, Take 400 mg by mouth every 6 (six) hours as needed for pain., Disp: , Rfl: ;  isosorbide mononitrate (IMDUR) 60 MG 24 hr tablet, Take 90 mg by mouth daily., Disp: , Rfl: ;  metFORMIN (GLUCOPHAGE) 500 MG tablet, Take 1 tablet (500 mg total) by mouth 2 (two) times daily with a meal., Disp: , Rfl:  NITROSTAT 0.4 MG SL tablet, PLACE 1 TABLET UNDER THE TONGUE EVERY 5 MINUTES AS NEEDED FOR CHEST PAIN, Disp: 25 tablet, Rfl: 0;  oxyCODONE (ROXICODONE) 15 MG  immediate release tablet, Take 1 tablet (15 mg total) by mouth every 6 (six) hours as needed for pain. With limit 4 per day - to fill Mar 23, 2013, Disp: 120 tablet, Rfl: 0;  traZODone (DESYREL) 50 MG tablet, Take 2 tablets (100 mg total) by mouth at bedtime., Disp: 60 tablet, Rfl: 5 zolpidem (AMBIEN) 10 MG tablet, Take 1 tablet (10 mg total) by mouth at bedtime as needed for sleep., Disp: , Rfl:   Axis Diagnosis:   Axis I: Anxiety Disorder NOS and Major Depression, Recurrent severe Axis II: Cluster B Traits Axis III:  Past Medical History  Diagnosis Date  . Impaired glucose tolerance 09/27/2010  . HYPERLIPIDEMIA-MIXED 07/15/2009  . Chronic pain syndrome 10/27/2009  . HYPERTENSION 06/24/2009  . CORONARY ARTERY DISEASE 06/24/2009    a. s/p multiple PCIs - In 2008 he had a Taxus DES to the mild LAD, Endeavor DES to mid LCX and distal LCX. In January 2009 he had DES to distal LCX, mid LCX and proximal LCX. In November 2009 had BMS x 2 to the mid RCA. Cath 10/2011 with patent stents, noncardiac CP. LHC 01/2013: patent stents (noncardiac CP).  .Marland KitchenURETHRAL STRICTURE 06/24/2009  . DEGENERATIVE JOINT DISEASE 06/24/2009  . SHOULDER PAIN, BILATERAL 06/24/2009  . ANKLE PAIN, RIGHT 06/24/2009  . DAmblerDISEASE, LUMBAR 04/19/2010  . SCIATICA, LEFT 04/19/2010  . NEPHROLITHIASIS, HX OF 06/24/2009  . ERECTILE DYSFUNCTION, ORGANIC 05/30/2010  . OSA (obstructive sleep apnea)     not using CPAP very often  . Obesity   . Allergy to clopidogrel   . ALLERGIC RHINITIS 06/24/2009  . Anxiety   . Depression     Prior suicide attempt   Axis IV: economic problems, other psychosocial or environmental problems, problems related to legal system/crime, problems related to social environment and problems with primary support group Axis V: 61-70 mild symptoms   Level of Care:  OP  Discharge destination:  Home  Is patient on multiple antipsychotic therapies at discharge:  No    Has Patient had three or more failed trials of antipsychotic  monotherapy by history:  No  Patient phone:  [phone](home)  Patient address:   [address] [id number]   Follow-up recommendations:  Activity:  as tolerated Diet:  regular Other:  follow up with Dr AAdele Schilderfor meds and Dr GCheryln Manlyfor therapy.   The patient received suicide prevention pamphlet:  Yes   TErin Sons10/29/2014, 2:25 PM

## 2013-03-20 ENCOUNTER — Ambulatory Visit (HOSPITAL_COMMUNITY): Payer: Self-pay | Admitting: Psychiatry

## 2013-03-20 ENCOUNTER — Encounter (HOSPITAL_COMMUNITY): Payer: Self-pay | Admitting: Psychiatry

## 2013-03-20 ENCOUNTER — Other Ambulatory Visit (HOSPITAL_COMMUNITY): Payer: Medicare HMO

## 2013-03-20 ENCOUNTER — Telehealth: Payer: Self-pay | Admitting: Cardiology

## 2013-03-20 ENCOUNTER — Ambulatory Visit (INDEPENDENT_AMBULATORY_CARE_PROVIDER_SITE_OTHER): Payer: Medicare HMO | Admitting: Psychiatry

## 2013-03-20 ENCOUNTER — Encounter (HOSPITAL_COMMUNITY): Payer: Self-pay | Admitting: Emergency Medicine

## 2013-03-20 ENCOUNTER — Emergency Department (HOSPITAL_COMMUNITY)
Admission: EM | Admit: 2013-03-20 | Discharge: 2013-03-20 | Disposition: A | Discharge status: Home / Self Care | Payer: Medicare HMO | Attending: Emergency Medicine | Admitting: Emergency Medicine

## 2013-03-20 ENCOUNTER — Other Ambulatory Visit (HOSPITAL_COMMUNITY): Payer: Self-pay | Admitting: Psychiatry

## 2013-03-20 VITALS — BP 130/70 | HR 88 | Ht 73.0 in | Wt 273.0 lb

## 2013-03-20 DIAGNOSIS — Z87891 Personal history of nicotine dependence: Secondary | ICD-10-CM | POA: Insufficient documentation

## 2013-03-20 DIAGNOSIS — Z79899 Other long term (current) drug therapy: Secondary | ICD-10-CM | POA: Insufficient documentation

## 2013-03-20 DIAGNOSIS — F39 Unspecified mood [affective] disorder: Secondary | ICD-10-CM

## 2013-03-20 DIAGNOSIS — H538 Other visual disturbances: Secondary | ICD-10-CM | POA: Insufficient documentation

## 2013-03-20 DIAGNOSIS — Z7982 Long term (current) use of aspirin: Secondary | ICD-10-CM | POA: Insufficient documentation

## 2013-03-20 DIAGNOSIS — F329 Major depressive disorder, single episode, unspecified: Secondary | ICD-10-CM | POA: Insufficient documentation

## 2013-03-20 DIAGNOSIS — Y929 Unspecified place or not applicable: Secondary | ICD-10-CM | POA: Insufficient documentation

## 2013-03-20 DIAGNOSIS — Z87442 Personal history of urinary calculi: Secondary | ICD-10-CM | POA: Insufficient documentation

## 2013-03-20 DIAGNOSIS — Z8739 Personal history of other diseases of the musculoskeletal system and connective tissue: Secondary | ICD-10-CM | POA: Insufficient documentation

## 2013-03-20 DIAGNOSIS — T48204A Poisoning by unspecified drugs acting on muscles, undetermined, initial encounter: Secondary | ICD-10-CM | POA: Insufficient documentation

## 2013-03-20 DIAGNOSIS — T50901A Poisoning by unspecified drugs, medicaments and biological substances, accidental (unintentional), initial encounter: Secondary | ICD-10-CM

## 2013-03-20 DIAGNOSIS — E782 Mixed hyperlipidemia: Secondary | ICD-10-CM | POA: Insufficient documentation

## 2013-03-20 DIAGNOSIS — I10 Essential (primary) hypertension: Secondary | ICD-10-CM | POA: Insufficient documentation

## 2013-03-20 DIAGNOSIS — Z8669 Personal history of other diseases of the nervous system and sense organs: Secondary | ICD-10-CM | POA: Insufficient documentation

## 2013-03-20 DIAGNOSIS — Z8709 Personal history of other diseases of the respiratory system: Secondary | ICD-10-CM | POA: Insufficient documentation

## 2013-03-20 DIAGNOSIS — T48201A Poisoning by unspecified drugs acting on muscles, accidental (unintentional), initial encounter: Secondary | ICD-10-CM | POA: Insufficient documentation

## 2013-03-20 DIAGNOSIS — I2510 Atherosclerotic heart disease of native coronary artery without angina pectoris: Secondary | ICD-10-CM | POA: Insufficient documentation

## 2013-03-20 DIAGNOSIS — F411 Generalized anxiety disorder: Secondary | ICD-10-CM | POA: Insufficient documentation

## 2013-03-20 DIAGNOSIS — F3289 Other specified depressive episodes: Secondary | ICD-10-CM | POA: Insufficient documentation

## 2013-03-20 DIAGNOSIS — G8929 Other chronic pain: Secondary | ICD-10-CM | POA: Insufficient documentation

## 2013-03-20 DIAGNOSIS — R42 Dizziness and giddiness: Secondary | ICD-10-CM | POA: Insufficient documentation

## 2013-03-20 DIAGNOSIS — E669 Obesity, unspecified: Secondary | ICD-10-CM | POA: Insufficient documentation

## 2013-03-20 DIAGNOSIS — Z87448 Personal history of other diseases of urinary system: Secondary | ICD-10-CM | POA: Insufficient documentation

## 2013-03-20 DIAGNOSIS — Y939 Activity, unspecified: Secondary | ICD-10-CM | POA: Insufficient documentation

## 2013-03-20 LAB — CBC WITH DIFFERENTIAL/PLATELET
Basophils Relative: 0 % (ref 0–1)
Eosinophils Relative: 2 % (ref 0–5)
HCT: 36.7 % — ABNORMAL LOW (ref 39.0–52.0)
Hemoglobin: 12.7 g/dL — ABNORMAL LOW (ref 13.0–17.0)
Lymphocytes Relative: 22 % (ref 12–46)
MCHC: 34.6 g/dL (ref 30.0–36.0)
MCV: 91.3 fL (ref 78.0–100.0)
Monocytes Absolute: 0.4 10*3/uL (ref 0.1–1.0)
Monocytes Relative: 9 % (ref 3–12)
Neutro Abs: 3.2 10*3/uL (ref 1.7–7.7)
WBC: 4.8 10*3/uL (ref 4.0–10.5)

## 2013-03-20 LAB — PROTIME-INR
INR: 1.08 (ref 0.00–1.49)
Prothrombin Time: 13.8 seconds (ref 11.6–15.2)

## 2013-03-20 LAB — COMPREHENSIVE METABOLIC PANEL
Albumin: 3.7 g/dL (ref 3.5–5.2)
BUN: 17 mg/dL (ref 6–23)
CO2: 25 mEq/L (ref 19–32)
Calcium: 8.9 mg/dL (ref 8.4–10.5)
Chloride: 104 mEq/L (ref 96–112)
Creatinine, Ser: 0.8 mg/dL (ref 0.50–1.35)
GFR calc non Af Amer: >90 mL/min (ref >90)
Glucose, Bld: 136 mg/dL — ABNORMAL HIGH (ref 70–99)
Total Bilirubin: 0.5 mg/dL (ref 0.3–1.2)

## 2013-03-20 MED ORDER — DIVALPROEX SODIUM 250 MG PO DR TAB: 250.0000 mg | DELAYED_RELEASE_TABLET | Freq: Two times a day (BID) | ORAL | Status: DC

## 2013-03-20 MED ORDER — SODIUM CHLORIDE 0.9 % IV BOLUS (SEPSIS)
1000.0000 mL | Freq: Once | INTRAVENOUS | Status: AC
  Administered 2013-03-20: 1000 mL via INTRAVENOUS

## 2013-03-20 MED ORDER — ONDANSETRON HCL 4 MG/2ML IJ SOLN
4.0000 mg | Freq: Once | INTRAMUSCULAR | Status: AC
  Administered 2013-03-20: 4 mg via INTRAVENOUS
  Filled 2013-03-20: qty 2

## 2013-03-20 MED ORDER — CHARCOAL ACTIVATED PO LIQD
50.0000 g | Freq: Once | ORAL | Status: AC
  Administered 2013-03-20: 50 g via ORAL
  Filled 2013-03-20: qty 240

## 2013-03-20 NOTE — Telephone Encounter (Signed)
Direct transfer/ pt states he accidentally took 30 levitra and 4 viagra. Pt was told to go to ED/Dr Aundra Dubin preferred he go by EMS but pt is currently in route by car to [place] [facility].

## 2013-03-20 NOTE — Progress Notes (Addendum)
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 YELLOW  Yellow;Lt. Yellow   APPearance CLEAR  Clear   Specific Gravity, Urine 1.020  1.000-1.030   pH 7.0  5.0 - 8.0   Total Protein, Urine NEGATIVE  Negative   Urine Glucose NEGATIVE  Negative   Ketones, ur NEGATIVE  Negative   Bilirubin Urine NEGATIVE  Negative   Hgb urine dipstick NEGATIVE  Negative   Urobilinogen, UA 0.2  0.0 - 1.0   Leukocytes, UA NEGATIVE  Negative   Nitrite NEGATIVE  Negative   WBC, UA 3-6/hpf  0-2/hpf   Mucus, UA Presence of  None   Squamous Epithelial / LPF Rare(0-4/hpf)  Rare(0-4/hpf)  CBC WITH DIFFERENTIAL     Status: Abnormal   Collection Time    03/17/13  7:15 PM      Result Value Range   WBC 5.0  4.0 - 10.5 K/uL   RBC 4.03 (*) 4.22 - 5.81 MIL/uL   Hemoglobin 12.6 (*) 13.0 - 17.0 g/dL   HCT 36.5 (*) 39.0 - 52.0 %   MCV 90.6  78.0 -  100.0 fL   MCH 31.3  26.0 - 34.0 pg   MCHC 34.5  30.0 - 36.0 g/dL   RDW 13.2  11.5 - 15.5 %   Platelets 220  150 - 400 K/uL   Neutrophils Relative % 67  43 - 77 %   Neutro Abs 3.4  1.7 - 7.7 K/uL   Lymphocytes Relative 22  12 - 46 %   Lymphs Abs 1.1  0.7 - 4.0 K/uL   Monocytes Relative 8  3 -  12 %   Monocytes Absolute 0.4  0.1 - 1.0 K/uL   Eosinophils Relative 2  0 - 5 %   Eosinophils Absolute 0.1  0.0 - 0.7 K/uL   Basophils Relative 0  0 - 1 %   Basophils Absolute 0.0  0.0 - 0.1 K/uL  COMPREHENSIVE METABOLIC PANEL     Status: Abnormal   Collection Time    03/17/13  7:15 PM      Result Value Range   Sodium 137  135 - 145 mEq/L   Potassium 3.8  3.5 - 5.1 mEq/L   Chloride 102  96 - 112 mEq/L   CO2 26  19 - 32 mEq/L   Glucose, Bld 92  70 - 99 mg/dL   BUN 13  6 - 23 mg/dL   Creatinine, Ser 0.93  0.50 - 1.35 mg/dL   Calcium 9.0  8.4 - 10.5 mg/dL   Total Protein 6.4  6.0 - 8.3 g/dL   Albumin 3.6  3.5 - 5.2 g/dL   AST 19  0 - 37 U/L   ALT 17  0 - 53 U/L   Alkaline Phosphatase 61  39 - 117 U/L   Total Bilirubin 0.7  0.3 - 1.2 mg/dL   GFR calc non Af Amer 89 (*) >90 mL/min   GFR calc Af Amer >90  >90 mL/min   Comment: (NOTE)     The eGFR has been calculated using the CKD EPI equation.     This calculation has not been validated in all clinical situations.     eGFR's persistently <90 mL/min signify possible Chronic Kidney     Disease.  POCT I-STAT TROPONIN I     Status: None   Collection Time    03/17/13  7:24 PM      Result Value Range   Troponin i, poc 0.00  0.00 - 0.08 ng/mL   Comment 3            Comment: Due to the release kinetics of cTnI,     a negative result within the first hours     of the onset of symptoms does not rule out     myocardial infarction with certainty.     If myocardial infarction is still suspected,     repeat the test at appropriate intervals.  URINE RAPID DRUG SCREEN (HOSP PERFORMED)     Status: Abnormal   Collection Time    03/18/13 12:27 PM       Result Value Range   Opiates NONE DETECTED  NONE DETECTED   Cocaine NONE DETECTED  NONE DETECTED   Benzodiazepines POSITIVE (*) NONE DETECTED   Amphetamines NONE DETECTED  NONE DETECTED   Tetrahydrocannabinol NONE DETECTED  NONE DETECTED   Barbiturates NONE DETECTED  NONE DETECTED   Comment:            DRUG SCREEN FOR MEDICAL PURPOSES     ONLY.  IF CONFIRMATION IS NEEDED     FOR ANY PURPOSE, NOTIFY LAB     WITHIN 5 DAYS.                LOWEST DETECTABLE LIMITS     FOR URINE DRUG SCREEN     Drug Class       Cutoff (ng/mL)     Amphetamine      1000     Barbiturate      200     Benzodiazepine   619     Tricyclics  300     Opiates          300     Cocaine          300     THC              50  ACETAMINOPHEN LEVEL     Status: None   Collection Time    03/18/13 12:44 PM      Result Value Range   Acetaminophen (Tylenol), Serum <15.0  10 - 30 ug/mL   Comment:            THERAPEUTIC CONCENTRATIONS VARY     SIGNIFICANTLY. A RANGE OF 10-30     ug/mL MAY BE AN EFFECTIVE     CONCENTRATION FOR MANY PATIENTS.     HOWEVER, SOME ARE BEST TREATED     AT CONCENTRATIONS OUTSIDE THIS     RANGE.     ACETAMINOPHEN CONCENTRATIONS     >150 ug/mL AT 4 HOURS AFTER     INGESTION AND >50 ug/mL AT 12     HOURS AFTER INGESTION ARE     OFTEN ASSOCIATED WITH TOXIC     REACTIONS.  CBC     Status: None   Collection Time    03/18/13 12:44 PM      Result Value Range   WBC 5.3  4.0 - 10.5 K/uL   RBC 4.37  4.22 - 5.81 MIL/uL   Hemoglobin 13.6  13.0 - 17.0 g/dL   HCT 40.1  39.0 - 52.0 %   MCV 91.8  78.0 - 100.0 fL   MCH 31.1  26.0 - 34.0 pg   MCHC 33.9  30.0 - 36.0 g/dL   RDW 13.4  11.5 - 15.5 %   Platelets 233  150 - 400 K/uL  COMPREHENSIVE METABOLIC PANEL     Status: Abnormal   Collection Time    03/18/13 12:44 PM      Result Value Range   Sodium 139  135 - 145 mEq/L   Potassium 4.2  3.5 - 5.1 mEq/L   Chloride 107  96 - 112 mEq/L   CO2 22  19 - 32 mEq/L   Glucose, Bld 98  70 - 99  mg/dL   BUN 17  6 - 23 mg/dL   Creatinine, Ser 0.94  0.50 - 1.35 mg/dL   Calcium 9.5  8.4 - 10.5 mg/dL   Total Protein 7.3  6.0 - 8.3 g/dL   Albumin 4.1  3.5 - 5.2 g/dL   AST 20  0 - 37 U/L   ALT 20  0 - 53 U/L   Alkaline Phosphatase 70  39 - 117 U/L   Total Bilirubin 0.4  0.3 - 1.2 mg/dL   GFR calc non Af Amer 89 (*) >90 mL/min   GFR calc Af Amer >90  >90 mL/min   Comment: (NOTE)     The eGFR has been calculated using the CKD EPI equation.     This calculation has not been validated in all clinical situations.     eGFR's persistently <90 mL/min signify possible Chronic Kidney     Disease.  ETHANOL     Status: None   Collection Time    03/18/13 12:44 PM      Result Value Range   Alcohol, Ethyl (B) <11  0 - 11 mg/dL   Comment:            LOWEST DETECTABLE LIMIT FOR     SERUM ALCOHOL IS 11  mg/dL     FOR MEDICAL PURPOSES ONLY  SALICYLATE LEVEL     Status: Abnormal   Collection Time    03/18/13 12:44 PM      Result Value Range   Salicylate Lvl <0.9 (*) 2.8 - 20.0 mg/dL  CBC WITH DIFFERENTIAL     Status: Abnormal   Collection Time    03/20/13 10:05 AM      Result Value Range   WBC 4.8  4.0 - 10.5 K/uL   RBC 4.02 (*) 4.22 - 5.81 MIL/uL   Hemoglobin 12.7 (*) 13.0 - 17.0 g/dL   HCT 36.7 (*) 39.0 - 52.0 %   MCV 91.3  78.0 - 100.0 fL   MCH 31.6  26.0 - 34.0 pg   MCHC 34.6  30.0 - 36.0 g/dL   RDW 13.4  11.5 - 15.5 %   Platelets 211  150 - 400 K/uL   Neutrophils Relative % 67  43 - 77 %   Neutro Abs 3.2  1.7 - 7.7 K/uL   Lymphocytes Relative 22  12 - 46 %   Lymphs Abs 1.1  0.7 - 4.0 K/uL   Monocytes Relative 9  3 - 12 %   Monocytes Absolute 0.4  0.1 - 1.0 K/uL   Eosinophils Relative 2  0 - 5 %   Eosinophils Absolute 0.1  0.0 - 0.7 K/uL   Basophils Relative 0  0 - 1 %   Basophils Absolute 0.0  0.0 - 0.1 K/uL  COMPREHENSIVE METABOLIC PANEL     Status: Abnormal   Collection Time    03/20/13 10:05 AM      Result Value Range   Sodium 137  135 - 145 mEq/L   Potassium 3.8  3.5  - 5.1 mEq/L   Chloride 104  96 - 112 mEq/L   CO2 25  19 - 32 mEq/L   Glucose, Bld 136 (*) 70 - 99 mg/dL   BUN 17  6 - 23 mg/dL   Creatinine, Ser 0.80  0.50 - 1.35 mg/dL   Calcium 8.9  8.4 - 10.5 mg/dL   Total Protein 6.5  6.0 - 8.3 g/dL   Albumin 3.7  3.5 - 5.2 g/dL   AST 17  0 - 37 U/L   ALT 16  0 - 53 U/L   Alkaline Phosphatase 58  39 - 117 U/L   Total Bilirubin 0.5  0.3 - 1.2 mg/dL   GFR calc non Af Amer >90  >90 mL/min   GFR calc Af Amer >90  >90 mL/min   Comment: (NOTE)     The eGFR has been calculated using the CKD EPI equation.     This calculation has not been validated in all clinical situations.     eGFR's persistently <90 mL/min signify possible Chronic Kidney     Disease.  PROTIME-INR     Status: None   Collection Time    03/20/13 10:05 AM      Result Value Range   Prothrombin Time 13.8  11.6 - 15.2 seconds   INR 1.08  0.00 - 1.49  APTT     Status: None   Collection Time    03/20/13 10:05 AM      Result Value Range   aPTT 29  24 - 37 seconds  SALICYLATE LEVEL     Status: Abnormal   Collection Time    03/20/13 10:05 AM      Result Value Range   Salicylate Lvl <6.0 (*) 2.8 -  20.0 mg/dL  ACETAMINOPHEN LEVEL     Status: None   Collection Time    03/20/13 10:05 AM      Result Value Range   Acetaminophen (Tylenol), Serum <15.0  10 - 30 ug/mL   Comment:            THERAPEUTIC CONCENTRATIONS VARY     SIGNIFICANTLY. A RANGE OF 10-30     ug/mL MAY BE AN EFFECTIVE     CONCENTRATION FOR MANY PATIENTS.     HOWEVER, SOME ARE BEST TREATED     AT CONCENTRATIONS OUTSIDE THIS     RANGE.     ACETAMINOPHEN CONCENTRATIONS     >150 ug/mL AT 4 HOURS AFTER     INGESTION AND >50 ug/mL AT 12     HOURS AFTER INGESTION ARE     OFTEN ASSOCIATED WITH TOXIC     REACTIONS.  POCT I-STAT TROPONIN I     Status: None   Collection Time    03/20/13 10:19 AM      Result Value Range   Troponin i, poc 0.01  0.00 - 0.08 ng/mL   Comment 3            Comment: Due to the release kinetics  of cTnI,     a negative result within the first hours     of the onset of symptoms does not rule out     myocardial infarction with certainty.     If myocardial infarction is still suspected,     repeat the test at appropriate intervals.      Physical Exam: Constitutional:  BP 130/70  Pulse 88  Ht 6' 1"  (1.854 m)  Wt 273 lb (123.832 kg)  BMI 36.03 kg/m2  Musculoskeletal: Strength & Muscle Tone: decreased Gait & Station: Difficulty walking due to pain Patient leans: Front  Mental Status Examination;  Patient is a 62 year old man who was fairly groomed.  He maintains fair eye contact.  His speech is coherent.  He described his mood as irritable and his affect is mood appropriate.  He denies any suicidal thoughts or homicidal thoughts but admitted constant rumination office thinking.  He denies any visual hallucinations but admitted auditory hallucinations on and off from his deceased father.  There were no tremors or shakes.  His fund of knowledge is adequate.  His psychomotor activity is slow.  He is alert and oriented x3.  His insight judgment and impulse control is fair.   Medical Decision Making (Choose Three): Established Problem, Stable/Improving (1), New problem, with additional work up planned, Review of Psycho-Social Stressors (1), Review or order clinical lab tests (1), Review and summation of old records (2), Established Problem, Worsening (2), New Problem, with no additional work-up planned (3), Review of Medication Regimen & Side Effects (2) and Review of New Medication or Change in Dosage (2)  Assessment: Axis I: Mood disorder NOS, rule out bipolar disorder depressed type  Axis II: Deferred  Axis III:  Past Medical History  Diagnosis Date  . Impaired glucose tolerance 09/27/2010  . HYPERLIPIDEMIA-MIXED 07/15/2009  . Chronic pain syndrome 10/27/2009  . HYPERTENSION 06/24/2009  . CORONARY ARTERY DISEASE 06/24/2009    a. s/p multiple PCIs - In 2008 he had a Taxus DES to the  mild LAD, Endeavor DES to mid LCX and distal LCX. In January 2009 he had DES to distal LCX, mid LCX and proximal LCX. In November 2009 had BMS x 2 to the mid RCA. Cath 10/2011 with patent stents,  noncardiac CP. LHC 01/2013: patent stents (noncardiac CP).  [name] URETHRAL STRICTURE 06/24/2009  . DEGENERATIVE JOINT DISEASE 06/24/2009  . SHOULDER PAIN, BILATERAL 06/24/2009  . ANKLE PAIN, RIGHT 06/24/2009  . [place] DISEASE, LUMBAR 04/19/2010  . SCIATICA, LEFT 04/19/2010  . NEPHROLITHIASIS, HX OF 06/24/2009  . ERECTILE DYSFUNCTION, ORGANIC 05/30/2010  . OSA (obstructive sleep apnea)     not using CPAP very often  . Obesity   . Allergy to clopidogrel   . ALLERGIC RHINITIS 06/24/2009  . Anxiety   . Depression     Prior suicide attempt    Axis IV: Moderate   Plan:  I reviewed the symptoms, current medication, psychosocial stressor and history.  Patient has been taken multiple antidepressant however he always have irritability mood anger and depression.  He has never tried mood stabilizer.  He admitted racing thoughts and history of impulsive behavior. I recommend to try Depakote 250 mg at bedtime and also in the morning.  Currently he is taking Wellbutrin XL 450 mg daily, Prozac 60 mg daily, Ambien 10 mg at bedtime, Klonopin 1 mg up to 3 times a day , trazodone 100 mg at bedtime.  Recommend to discontinue Ambien discontinue trazodone.  Recommend to decrease Wellbutrin to 300 mg, Klonopin 1 mg up to twice a day as needed and Prozac to 40 mg only.  We may need to titrate Depakote further since patient do not exhibit any side effects.  I had a long discussion with the patient about benzodiazepine dependence, tolerance and withdrawal symptoms.  Recommend to see his therapist Welford Roche for counseling.  Discussed in detail the safety plan.  Discuss in detail the risks and benefits of medication and side effects.  Followup in 2 weeks.  Time spent 55 minutes.  Recommend to cause back if he is any question or  concern.  Ajai Harville T., MD 03/20/2013

## 2013-03-20 NOTE — ED Provider Notes (Signed)
CSN: [id number]     Arrival date & time 03/20/13  1497 History   First MD Initiated Contact with Patient 03/20/13 [phone]     Chief Complaint  Patient presents with  . Ingestion   (Consider location/radiation/quality/duration/timing/severity/associated sxs/prior Treatment) Patient is a 62 y.o. male presenting with Ingested Medication. The history is provided by the patient.  Ingestion This is a new (pt states this am he was at home and got distracted and took the wrong cup of medicaitons that included 30-24m levitra and 4-1043mviagra.  He also took all his home meds as well.) problem. The current episode started less than 1 hour ago. The problem occurs constantly. The problem has been gradually worsening. Pertinent negatives include no chest pain, no abdominal pain, no headaches and no shortness of breath. Associated symptoms comments: No numbness, tingling, CP but is c/o of lightheadedness and blurred vision.  Denies erection.. The symptoms are aggravated by walking and standing. Nothing relieves the symptoms. He has tried nothing for the symptoms. The treatment provided no relief.    Past Medical History  Diagnosis Date  . Impaired glucose tolerance 09/27/2010  . HYPERLIPIDEMIA-MIXED 07/15/2009  . Chronic pain syndrome 10/27/2009  . HYPERTENSION 06/24/2009  . CORONARY ARTERY DISEASE 06/24/2009    a. s/p multiple PCIs - In 2008 he had a Taxus DES to the mild LAD, Endeavor DES to mid LCX and distal LCX. In January 2009 he had DES to distal LCX, mid LCX and proximal LCX. In November 2009 had BMS x 2 to the mid RCA. Cath 10/2011 with patent stents, noncardiac CP. LHC 01/2013: patent stents (noncardiac CP).  . Marland KitchenRETHRAL STRICTURE 06/24/2009  . DEGENERATIVE JOINT DISEASE 06/24/2009  . SHOULDER PAIN, BILATERAL 06/24/2009  . ANKLE PAIN, RIGHT 06/24/2009  . [place]  . SCIATICA, LEFT 04/19/2010  . NEPHROLITHIASIS, HX OF 06/24/2009  . ERECTILE DYSFUNCTION, ORGANIC 05/30/2010  . OSA (obstructive  sleep apnea)     not using CPAP very often  . Obesity   . Allergy to clopidogrel   . ALLERGIC RHINITIS 06/24/2009  . Anxiety   . Depression     Prior suicide attempt   Past Surgical History  Procedure Laterality Date  . Tonsillectomy    . Rotator cuff repair      bilateral  . S/p knee replacement  2008  . S/p bilat wrist surgury    . S/p urethral dilations,  multi 2009,2010  . Carpal tunnel release    . S/p right knee arthroscopy  03/2005    and multi prior  . S/p umbilical hernia    . S/p left knee arthroscopy  june 2002    multi prior  . S/p right shoulder rotater cuff surgury  april 2011    Dr. GiGladstone Lighter Family History  Problem Relation Age of Onset  . Depression Mother   . Heart disease Mother   . Hypertension Mother   . Cancer Mother     Breast  . Diabetes Father   . Heart disease Father     CABG  . Cancer Father     prostate and skin cancer  . Hypertension Father   . Hyperlipidemia Father   . Depression Brother     x 2  . Hypertension Brother     x2  . Coronary artery disease Other   . Hypertension Other   . Depression Other   . Heart disease Maternal Grandfather   . Early death Maternal Grandfather [id number]  heart attack  . Early death Paternal Grandfather    History  Substance Use Topics  . Smoking status: Former Smoker    Types: Cigars    Quit date: 08/28/2010  . Smokeless tobacco: Never Used     Comment: rare  . Alcohol Use: 0.0 oz/week     Comment: occasional    Review of Systems  Respiratory: Negative for shortness of breath.   Cardiovascular: Negative for chest pain.  Gastrointestinal: Negative for abdominal pain.  Neurological: Negative for headaches.  All other systems reviewed and are negative.    Allergies  Review of patient's allergies indicates no known allergies.  Home Medications   Current Outpatient Rx  Name  Route  Sig  Dispense  Refill  . amLODipine (NORVASC) 5 MG tablet   Oral   Take 5 mg by mouth daily.         [name]  aspirin 81 MG tablet   Oral   Take 1 tablet (81 mg total) by mouth daily.   30 tablet   0   . atorvastatin (LIPITOR) 20 MG tablet   Oral   Take 20 mg by mouth at bedtime.         . benazepril (LOTENSIN) 20 MG tablet   Oral   Take 20 mg by mouth at bedtime.         [name] buPROPion (WELLBUTRIN XL) 150 MG 24 hr tablet   Oral   Take 150 mg by mouth daily. Take with 300 mg to equal 450 mg         . buPROPion (WELLBUTRIN XL) 300 MG 24 hr tablet   Oral   Take 300 mg by mouth daily.         . clonazePAM (KLONOPIN) 1 MG tablet   Oral   Take 1 tablet (1 mg total) by mouth 3 (three) times daily as needed for anxiety.   90 tablet   2   . cyclobenzaprine (FLEXERIL) 5 MG tablet   Oral   Take 5 mg by mouth 3 (three) times daily as needed for muscle spasms.         [name] EFFIENT 10 MG TABS      TAKE 1 TABLET BY MOUTH DAILY   30 tablet   3   . FLUoxetine (PROZAC) 20 MG capsule   Oral   Take 20 mg by mouth daily. Take with 40 mg to equal 89m until 40 mg runs out         . fluticasone (FLONASE) 50 MCG/ACT nasal spray      PLACE 1 SPRAY INTO EACH NOSTRIL DAILY   16 g   11   . gabapentin (NEURONTIN) 400 MG capsule   Oral   Take 800 mg by mouth 4 (four) times daily.         .Marland Kitchenibuprofen (ADVIL,MOTRIN) 200 MG tablet   Oral   Take 400 mg by mouth every 6 (six) hours as needed for pain.         . isosorbide mononitrate (IMDUR) 60 MG 24 hr tablet   Oral   Take 90 mg by mouth daily.         . metFORMIN (GLUCOPHAGE) 500 MG tablet   Oral   Take 1 tablet (500 mg total) by mouth 2 (two) times daily with a meal.           !!!!!!!!!!!!!!!!!!!!!!!!!!!!!!! Do not restart unt ...   . NITROSTAT 0.4 MG SL tablet  PLACE 1 TABLET UNDER THE TONGUE EVERY 5 MINUTES AS NEEDED FOR CHEST PAIN   25 tablet   0   . oxyCODONE (ROXICODONE) 15 MG immediate release tablet   Oral   Take 1 tablet (15 mg total) by mouth every 6 (six) hours as needed for pain. With limit 4 per day -  to fill Mar 23, 2013   120 tablet   0   . traZODone (DESYREL) 50 MG tablet   Oral   Take 2 tablets (100 mg total) by mouth at bedtime.   60 tablet   5   . zolpidem (AMBIEN) 10 MG tablet   Oral   Take 1 tablet (10 mg total) by mouth at bedtime as needed for sleep.          BP 103/56  Pulse 86  Temp(Src) 98.3 F (36.8 C) (Oral)  Resp 20  SpO2 96% Physical Exam  Nursing note and vitals reviewed. Constitutional: He is oriented to person, place, and time. He appears well-developed and well-nourished. No distress.  Awake alert and oriented.  HENT:  Head: Normocephalic and atraumatic.  Mouth/Throat: Oropharynx is clear and moist.  Eyes: Conjunctivae and EOM are normal. Pupils are equal, round, and reactive to light.  Neck: Normal range of motion. Neck supple.  Cardiovascular: Normal rate, regular rhythm and intact distal pulses.   No murmur heard. Pulmonary/Chest: Effort normal and breath sounds normal. No respiratory distress. He has no wheezes. He has no rales.  Abdominal: Soft. He exhibits no distension. There is no tenderness. There is no rebound and no guarding.  Musculoskeletal: Normal range of motion. He exhibits no edema and no tenderness.  Neurological: He is alert and oriented to person, place, and time.  Skin: Skin is warm and dry. No rash noted. No erythema.  Psychiatric: He has a normal mood and affect. His behavior is normal.    ED Course  Procedures (including critical care time) Labs Review Labs Reviewed  CBC WITH DIFFERENTIAL - Abnormal; Notable for the following:    RBC 4.02 (*)    Hemoglobin 12.7 (*)    HCT 36.7 (*)    All other components within normal limits  COMPREHENSIVE METABOLIC PANEL - Abnormal; Notable for the following:    Glucose, Bld 136 (*)    All other components within normal limits  SALICYLATE LEVEL - Abnormal; Notable for the following:    Salicylate Lvl <1.2 (*)    All other components within normal limits  PROTIME-INR  APTT   ACETAMINOPHEN LEVEL  URINE RAPID DRUG SCREEN (HOSP PERFORMED)  POCT I-STAT TROPONIN I   Imaging Review No results found.  EKG Interpretation     Ventricular Rate:  69 PR Interval:  165 QRS Duration: 127 QT Interval:  420 QTC Calculation: 450 R Axis:   31 Text Interpretation:  Sinus rhythm Nonspecific intraventricular conduction delay Baseline wander in lead(s) V1 V2 V3 V5 V6 No significant change since last tracing            MDM   1. Accidental overdose, initial encounter     Patient presents after accidentally taking 600 mg of Levitra and 400 mg of Viagra by accident. He also took his normal blood pressure medications today which included Benzapril, Norvasc and Imdur.  Ingestion occurred approximately 30 minutes prior to arrival. Upon arrival patient only complained of feeling lightheaded but denied any chest pain, abdominal pain, numbness, weakness and some mild blurry vision. Spoke with poison control upon patient's arrival and they  recommended activated charcoal, symptomatic and supportive care.  Labs pending.  1:30 PM Normal labs.  After 5 hours of observation pt has no sx and has required no supportive care.  Suspicious that pt may not have ingested what he thought.  Spoke with poison control who agrees that half-life is 5 hours and since no hypotension, priapism or other sx pt is cleared to go.  Pt adamantly denies SI at this time.  2:31 PM Discussed with poison controla nd pt and he states there is no way he could have taken more of his regular meds as they are locked in the safe.  He states it was definitely the levitra.  Pt is still assymptomatic and his bp is baseline per him and the notes prior.  Will d/c and he is to return for worsening sx.  Blanchie Dessert, MD 03/20/13 [phone]

## 2013-03-20 NOTE — ED Notes (Signed)
Pt states about 30 minutes ago he took 61 Levitra and 4 Viagra by accident, states he usually takes about 19 medications a day, grabbed the wrong cup of medications and took them, states at this time he is having blurred vision and light headed, denies pain/numbness or tingling at this time or any penile problems at this time.

## 2013-03-20 NOTE — Telephone Encounter (Signed)
New message    Pt states that he took by accident 68 levitra and 4 viagra tablets

## 2013-03-20 NOTE — Discharge Instructions (Signed)
Accidental Overdose A drug overdose occurs when a chemical substance (drug or medication) is used in amounts large enough to overcome a person. This may result in severe illness or death. This is a type of poisoning. Accidental overdoses of medications or other substances come from a variety of reasons. When this happens accidentally, it is often because the person taking the substance does not know enough about what they have taken. Drugs which commonly cause overdose deaths are alcohol, psychotropic medications (medications which affect the mind), pain medications, illegal drugs (street drugs) such as cocaine and heroin, and multiple drugs taken at the same time. It may result from careless behavior (such as over-indulging at a party). Other causes of overdose may include multiple drug use, a lapse in memory, or drug use after a period of no drug use.  Sometimes overdosing occurs because a person cannot remember if they have taken their medication.  A common unintentional overdose in young children involves multi-vitamins containing iron. Iron is a part of the hemoglobin molecule in blood. It is used to transport oxygen to living cells. When taken in small amounts, iron allows the body to restock hemoglobin. In large amounts, it causes problems in the body. If this overdose is not treated, it can lead to death. Never take medicines that show signs of tampering or do not seem quite right. Never take medicines in the dark or in poor lighting. Read the label and check each dose of medicine before you take it. When adults are poisoned, it happens most often through carelessness or lack of information. Taking medicines in the dark or taking medicine prescribed for someone else to treat the same type of problem is a dangerous practice. SYMPTOMS  Symptoms of overdose depend on the medication and amount taken. They can vary from over-activity with stimulant over-dosage, to sleepiness from depressants such as  alcohol, narcotics and tranquilizers. Confusion, dizziness, nausea and vomiting may be present. If problems are severe enough coma and death may result. DIAGNOSIS  Diagnosis and management are generally straightforward if the drug is known. Otherwise it is more difficult. At times, certain symptoms and signs exhibited by the patient, or blood tests, can reveal the drug in question.  TREATMENT  In an emergency department, most patients can be treated with supportive measures. Antidotes may be available if there has been an overdose of opioids or benzodiazepines. A rapid improvement will often occur if this is the cause of overdose. At home or away from medical care:  There may be no immediate problems or warning signs in children.  Not everything works well in all cases of poisoning.  Take immediate action. Poisons may act quickly.  If you think someone has swallowed medicine or a household product, and the person is unconscious, having seizures (convulsions), or is not breathing, immediately call for an ambulance. IF a person is conscious and appears to be doing OK but has swallowed a poison:  Do not wait to see what effect the poison will have. Immediately call a poison control center (listed in the white pages of your telephone book under "Poison Control" or inside the front cover with other emergency numbers). Some poison control centers have TTY capability for the deaf. Check with your local center if you or someone in your family requires this service.  Keep the container so you can read the label on the product for ingredients.  Describe what, when, and how much was taken and the age and condition of the person poisoned.  Inform them if the person is vomiting, choking, drowsy, shows a change in color or temperature of skin, is conscious or unconscious, or is convulsing.  Do not cause vomiting unless instructed by medical personnel. Do not induce vomiting or force liquids into a person who  is convulsing, unconscious, or very drowsy. Stay calm and in control.   Activated charcoal also is sometimes used in certain types of poisoning and you may wish to add a supply to your emergency medicines. It is available without a prescription. Call a poison control center before using this medication. PREVENTION  Thousands of children die every year from unintentional poisoning. This may be from household chemicals, poisoning from carbon monoxide in a car, taking their parent's medications, or simply taking a few iron pills or vitamins with iron. Poisoning comes from unexpected sources.  Store medicines out of the sight and reach of children, preferably in a locked cabinet. Do not keep medications in a food cabinet. Always store your medicines in a secure place. Get rid of expired medications.  If you have children living with you or have them as occasional guests, you should have child-resistant caps on your medicine containers. Keep everything out of reach. Child proof your home.  If you are called to the telephone or to answer the door while you are taking a medicine, take the container with you or put the medicine out of the reach of small children.  Do not take your medication in front of children. Do not tell your child how good a medication is and how good it is for them. They may get the idea it is more of a treat.  If you are an adult and have accidentally taken an overdose, you need to consider how this happened and what can be done to prevent it from happening again. If this was from a street drug or alcohol, determine if there is a problem that needs addressing. If you are not sure a problems exists, it is easy to talk to a professional and ask them if they think you have a problem. It is better to handle this problem in this way before it happens again and has a much worse consequence. Document Released: 07/22/2004 Document Revised: 07/31/2011 Document Reviewed: 12/28/2008 [facility]  Patient Information 2014 [place].  Activated Charcoal Activated charcoal is a mixture of very fine charcoal particles that absorb and trap poisons or drugs and prevent them from passing into your blood. Activated charcoal is usually given once. It may also be mixed with a medicine (laxative)to help you have a bowel movement. This helps move the toxins through the gut quickly. The activated charcoal may upset your stomach, cause vomiting, diarrhea or even constipation. Your stool (feces) will usually be black for 1 to 2 days. Call your caregiver if you have any concerns about your treatment. Document Released: 06/15/2004 Document Revised: 07/31/2011 Document Reviewed: 03/05/2009 [facility] Patient Information 2014 [place].

## 2013-03-21 ENCOUNTER — Ambulatory Visit: Payer: Self-pay | Admitting: Psychology

## 2013-03-21 ENCOUNTER — Ambulatory Visit
Admission: RE | Admit: 2013-03-21 | Discharge: 2013-03-21 | Disposition: A | Discharge status: Home / Self Care | Payer: Commercial Managed Care - HMO | Source: Ambulatory Visit | Attending: Internal Medicine | Admitting: Internal Medicine

## 2013-03-21 ENCOUNTER — Other Ambulatory Visit (HOSPITAL_COMMUNITY): Payer: Medicare HMO

## 2013-03-21 ENCOUNTER — Other Ambulatory Visit: Payer: Self-pay | Admitting: Internal Medicine

## 2013-03-21 DIAGNOSIS — R269 Unspecified abnormalities of gait and mobility: Secondary | ICD-10-CM

## 2013-03-21 DIAGNOSIS — R2689 Other abnormalities of gait and mobility: Secondary | ICD-10-CM

## 2013-03-21 DIAGNOSIS — R251 Tremor, unspecified: Secondary | ICD-10-CM

## 2013-03-23 ENCOUNTER — Other Ambulatory Visit (HOSPITAL_COMMUNITY): Payer: Self-pay | Admitting: Psychiatry

## 2013-03-23 NOTE — Telephone Encounter (Signed)
Not now since may need meds adjustment in future.

## 2013-03-24 ENCOUNTER — Encounter: Payer: Self-pay | Admitting: Family Medicine

## 2013-03-24 ENCOUNTER — Ambulatory Visit (INDEPENDENT_AMBULATORY_CARE_PROVIDER_SITE_OTHER): Payer: Medicare HMO | Admitting: Family Medicine

## 2013-03-24 ENCOUNTER — Other Ambulatory Visit (HOSPITAL_COMMUNITY): Payer: Medicare HMO

## 2013-03-24 VITALS — BP 140/80 | HR 58 | Wt 271.0 lb

## 2013-03-24 DIAGNOSIS — R269 Unspecified abnormalities of gait and mobility: Secondary | ICD-10-CM

## 2013-03-24 DIAGNOSIS — M5136 Other intervertebral disc degeneration, lumbar region: Secondary | ICD-10-CM

## 2013-03-24 DIAGNOSIS — M25552 Pain in left hip: Secondary | ICD-10-CM

## 2013-03-24 DIAGNOSIS — M25559 Pain in unspecified hip: Secondary | ICD-10-CM

## 2013-03-24 DIAGNOSIS — M5137 Other intervertebral disc degeneration, lumbosacral region: Secondary | ICD-10-CM

## 2013-03-24 MED ORDER — METHYLPREDNISOLONE ACETATE 40 MG/ML IJ SUSP
80.0000 mg | Freq: Once | INTRAMUSCULAR | Status: AC
  Administered 2013-03-24: 80 mg via INTRAMUSCULAR

## 2013-03-24 MED ORDER — KETOROLAC TROMETHAMINE 60 MG/2ML IM SOLN
60.0000 mg | Freq: Once | INTRAMUSCULAR | Status: AC
  Administered 2013-03-24: 60 mg via INTRAMUSCULAR

## 2013-03-24 NOTE — Progress Notes (Signed)
I'm seeing this patient by the request  of:  [name], MD   CC: Back and hip pain  HPI: Patient is a 62 year old gentleman with a past medical history significant for chronic pain syndrome, degenerative joint disease, lumbar disc disease, bilateral rotator cuff repair, and knee replacement coming in with complaints of hip pain and back pain. Regarding his hip pain, patient has had it for multiple years. Patient does give a past medical history of doing steroids for quite some time. Patient states that the pain is worse with ambulation and does have a dull aching sensation when he is sleeping. Patient does have sharp pain with flexion of the hip. Patient states that this has seem to be getting worse over the course of time. Patient is taking indomethacin, oxycodone, Flexeril with mild improvement. Patient has lost 115 pounds recently and states that improve some problems but still go back and hip pain pain seems to be worse. States sometime in can have radiation to the left leg. Denies any numbness. Patient does have weakness in the legs bilaterally but states that that is not from his back. Patient is a severity a 6/10.   Regarding his back pain pretty much very similar to as stated above. MRI 2012 of the lumbar spine showed moderate spinal stenosis as well as multiple level degenerative disc disease. Patient has undergone epidural injections before which do give him some relief.   Past medical, surgical, family and social history reviewed. Medications reviewed all in the electronic medical record.   Review of Systems: No headache, visual changes, nausea, vomiting, diarrhea, constipation, dizziness, abdominal pain, skin rash, fevers, chills, night sweats, weight loss, swollen lymph nodes, body aches, joint swelling, muscle aches, chest pain, shortness of breath, mood changes.   Objective:    Blood pressure 140/80, pulse 58, weight 271 lb (122.925 kg), SpO2 97.00%.   General: No apparent  distress alert and oriented x3 mood and affect normal, dressed appropriately.  HEENT: Pupils equal, extraocular movements intact Respiratory: Patient's speak in full sentences and does not appear short of breath Cardiovascular: No lower extremity edema, non tender, no erythema Skin: Warm dry intact with no signs of infection or rash on extremities or on axial skeleton. Abdomen: Soft nontender Neuro: Cranial nerves II through XII are intact, neurovascularly intact in all extremities  Lymph: No lymphadenopathy of posterior or anterior cervical chain or axillae bilaterally.  Gait shows patient does have extreme external rotation of the right leg secondary to knee replacement as well as foot drop. Patient is wearing a brace on his right ankle. Patient has a significant antalgic gait as well as a flexed lumbar spine negative very difficult to ambulate correctly.  MSK: Non tender with full range of motion and good stability and symmetric strength and tone of shoulders, elbows, wrist, , knee bilaterally. Patient's right shoulder is weaker than his left shoulder. Does have mild atrophy of the muscle in that area as well. Mild resting tremor of the right hand Back Exam:  Inspection: Unremarkable  Motion: Flexion 25 deg, Extension 25 deg, Side Bending to 35 deg bilaterally,  Rotation to 45 deg bilaterally  SLR laying: Positive left XSLR laying: Negative  Palpable tenderness: Diffuse of the paraspinal musculature of the thoracic and lumbar spine bilaterally FABER: Positive for difficult to assess secondary to patient having your placement bilaterally. Sensory change: Gross sensation intact to all lumbar and sacral dermatomes.  Reflexes: 2+ at both patellar tendons, 2+ at achilles tendons, Babinski's downgoing.  Strength  at foot  Plantar-flexion: 3/5 on the right with 4/5 on the Dorsi-flexion: 3/5 on right and 4-5 on left Eversion: 5/5 Inversion: 5/5  Leg strength  Gait unremarkable.  Hip:  Left ROM IR: 15 Deg, ER: 45 Deg, Flexion: 120 Deg, Extension: 100 Deg, Abduction: 45 Deg, Adduction: 45 Deg Strength IR: 4/5, ER: 4/5, Flexion: 5/5, Extension: 5/5, Abduction: 3/5, Adduction: 4/5 Pelvic alignment unremarkable to inspection and palpation. Standing hip rotation and gait without trendelenburg sign / unsteadiness. Greater trochanter with mild tenderness to palpation. No tenderness over piriformis  Positive pain with FABER in severe pain with FADIR. No SI joint tenderness and normal minimal SI movement.   Impression and Recommendations:     This case required medical decision making of moderate complexity.

## 2013-03-24 NOTE — Assessment & Plan Note (Signed)
The patient's left hip pain seems to be secondary to arthritis. When reviewing patient's x-rays I would say he has more of a moderate osteophytic changes. These were nonweightbearing and probably worse with weightbearing. Patient was given a home exercise program, discussed over-the-counter medications that should be beneficial, and it would come back again in 2 weeks. If he continues to have pain at that time we'll consider an ultrasound-guided intra-articular hip injection.

## 2013-03-24 NOTE — Assessment & Plan Note (Signed)
Secondary to arthritic pain and tendon breakdown due to chronic steroid use. Patient will start vitamin D which a think will be beneficial. There is a potential that polypharmacy plays a role. Discussed bringing Neurontin down to 400 mg 3 times daily. Patient will avoid his Ambien as well as trazodone while he increases his Depakote. We'll see patient again in 2 weeks.

## 2013-03-24 NOTE — Patient Instructions (Signed)
Very nice to meet you You do have a lot going on  Neurontin 416m three times a day only.  The following are things that can help arthritis.  Take tylenol 650 mg three times a day is the best evidence based medicine we have for arthritis.  Glucosamine sulfate 7527mtwice a day is a supplement that has been shown to help moderate to severe arthritis. Capsaicin topically up to four times a day may also help with pain. Vitamin D 1000-2000 IU daily Fish oil 3 grams daily.  Cortisone injections are an option if these interventions do not seem to make a difference or need more relief.  We can do a hip injection at next visit.  It's important that you continue to stay active. Try diabetic insoles Walker or cane if needed. Heat or ice 20 minutes at a time 3-4 times a day as needed to help with pain. Water aerobics and cycling with low resistance are the best two types of exercise for arthritis. Come back and see me in 2 weeks and if in pain will do injection i nthe left hip.

## 2013-03-24 NOTE — Assessment & Plan Note (Signed)
Patient does have severe osteoarthritic changes and degenerative disc disease at multiple levels but is definitely contributing to his pain. Patient has talked to chronic pain providers and they have stated that surgical intervention seems to be a last resort. Patient has declined that recommendation multiple times. Patient will try over-the-counter medications, given different exercises mostly been range of motion and we'll discuss strengthening at a later date. Patient does give a past medical history significant for steroid use for multiple years that likely has exacerbated this problem. Toradol and Depo-Medrol given today to try and relieve some of the pain. Patient will come back again in 2 weeks for further evaluation. Patient was given the option for formal physical therapy which he declined.

## 2013-03-25 ENCOUNTER — Ambulatory Visit: Payer: Self-pay | Admitting: Neurology

## 2013-03-25 ENCOUNTER — Ambulatory Visit (INDEPENDENT_AMBULATORY_CARE_PROVIDER_SITE_OTHER): Payer: Medicare HMO | Admitting: Neurology

## 2013-03-25 ENCOUNTER — Encounter: Payer: Self-pay | Admitting: Neurology

## 2013-03-25 VITALS — BP 134/70 | HR 58 | Temp 97.9°F | Resp 16 | Ht 73.0 in | Wt 277.3 lb

## 2013-03-25 DIAGNOSIS — IMO0001 Reserved for inherently not codable concepts without codable children: Secondary | ICD-10-CM

## 2013-03-25 DIAGNOSIS — R2681 Unsteadiness on feet: Secondary | ICD-10-CM

## 2013-03-25 DIAGNOSIS — Z5181 Encounter for therapeutic drug level monitoring: Secondary | ICD-10-CM

## 2013-03-25 DIAGNOSIS — R269 Unspecified abnormalities of gait and mobility: Secondary | ICD-10-CM

## 2013-03-25 DIAGNOSIS — R251 Tremor, unspecified: Secondary | ICD-10-CM

## 2013-03-25 DIAGNOSIS — R259 Unspecified abnormal involuntary movements: Secondary | ICD-10-CM

## 2013-03-25 NOTE — Progress Notes (Signed)
Travis [name] was seen today in the movement disorders clinic for neurologic consultation at the request of [name], MD.  The consultation is for the evaluation of balance changes, tremor and to r/o PD.  The pt is R handed and noted balance changes 7-8 months ago.  The pt states that initially it was a little "off balance" to now more significant loss of balance.  He falls 2-3 times per day.  He can fall forward or backward.  He has had multiple bruises from falling; he has had no fractures.   Specific Symptoms:  Tremor: yes (at rest, R sided in hand only, intermittent but daily) Voice: no changes Sleep: sleeps well with multiple meds  Vivid Dreams:  no  Acting out dreams:  no Wet Pillows: no Postural symptoms:  yes  Falls?  yes Bradykinesia symptoms: difficulty getting out of a chair and difficulty regaining balance Loss of smell:  yes Loss of taste:  yes Urinary Incontinence:  no Difficulty Swallowing:  no Handwriting, micrographia: no Trouble with ADL's:  no  Trouble buttoning clothing: no Depression:  yes, seeing psychiatry Memory changes:  yes Hallucinations:  no  visual distortions: yes N/V:  no Lightheaded:  yes  Syncope: no Diplopia:  yes (goes away if closes eye, horizontal, rarely has it) Dyskinesia:  no  Neuroimaging has previously been performed.  It is available for my review today.  His MRI of the brain was done on 03/22/2013.  There is mild small vessel disease.  The formal report is below:  MRI HEAD WITHOUT CONTRAST  TECHNIQUE:  Multiplanar, multisequence MR imaging was performed. No intravenous  contrast was administered.  COMPARISON: CT head 03/17/2013.  FINDINGS:  No acute stroke or hemorrhage. No mass lesion or hydrocephalus. No  extra-axial fluid. Mild cerebral and cerebellar atrophy. Mild  chronic microvascular ischemic change affects the periventricular  greater than subcortical white matter. No brainstem or cerebellar  involvement. No lacunar  or large vessel infarct is evident. Flow  voids are maintained throughout the major intracranial vascular  structures. No evidence for chronic hemorrhage. Calvarium, skull  base, and upper cervical region appear unremarkable. No pituitary or  cerebellar tonsil abnormality. Scalp, extracranial soft tissues, and  orbits appear unremarkable. Small bilateral mastoid effusions.  Bilateral ethmoid sinus disease is greater on the left. Good  agreement with prior CT.  IMPRESSION:  Mild cerebral and cerebellar atrophy. Mild chronic microvascular  ischemic change. No acute intracranial findings. No evidence for  occult intracranial hemorrhage or recent acute stroke.    PREVIOUS MEDICATIONS: none to date  ALLERGIES:  No Known Allergies  CURRENT MEDICATIONS:  Current Outpatient Prescriptions on File Prior to Visit  Medication Sig Dispense Refill  . amLODipine (NORVASC) 5 MG tablet Take 5 mg by mouth daily.      [name] aspirin 81 MG tablet Take 1 tablet (81 mg total) by mouth daily.  30 tablet  0  . atorvastatin (LIPITOR) 20 MG tablet Take 20 mg by mouth at bedtime.      . benazepril (LOTENSIN) 20 MG tablet Take 20 mg by mouth at bedtime.      [name] buPROPion (WELLBUTRIN XL) 300 MG 24 hr tablet TAKE 1 TABLET BY MOUTH DAILY  90 tablet  2  . clonazePAM (KLONOPIN) 1 MG tablet Take 1 tablet (1 mg total) by mouth 3 (three) times daily as needed for anxiety.  90 tablet  2  . cyclobenzaprine (FLEXERIL) 5 MG tablet Take 5 mg by mouth 3 (three) times daily  as needed for muscle spasms.      . divalproex (DEPAKOTE) 250 MG DR tablet Take 1 tablet (250 mg total) by mouth 2 (two) times daily.  60 tablet  0  . EFFIENT 10 MG TABS TAKE 1 TABLET BY MOUTH DAILY  30 tablet  3  . FLUoxetine (PROZAC) 40 MG capsule Take 40 mg by mouth daily. Takes with 61m to equal 631m     . fluticasone (FLONASE) 50 MCG/ACT nasal spray PLACE 1 SPRAY INTO EACH NOSTRIL DAILY  16 g  11  . gabapentin (NEURONTIN) 400 MG capsule Take 800 mg by  mouth 4 (four) times daily.      . indomethacin (INDOCIN) 25 MG capsule       . isosorbide mononitrate (IMDUR) 60 MG 24 hr tablet Take 90 mg by mouth daily.      . Marland Kitchenidocaine (LIDODERM) 5 %       . metFORMIN (GLUCOPHAGE) 500 MG tablet Take 1 tablet (500 mg total) by mouth 2 (two) times daily with a meal.      . oxyCODONE (ROXICODONE) 15 MG immediate release tablet Take 1 tablet (15 mg total) by mouth every 6 (six) hours as needed for pain. With limit 4 per day - to fill Mar 23, 2013  120 tablet  0  . traZODone (DESYREL) 50 MG tablet Take 2 tablets (100 mg total) by mouth at bedtime.  60 tablet  5   No current facility-administered medications on file prior to visit.    PAST MEDICAL HISTORY:   Past Medical History  Diagnosis Date  . Impaired glucose tolerance 09/27/2010  . HYPERLIPIDEMIA-MIXED 07/15/2009  . Chronic pain syndrome 10/27/2009  . HYPERTENSION 06/24/2009  . CORONARY ARTERY DISEASE 06/24/2009    a. s/p multiple PCIs - In 2008 he had a Taxus DES to the mild LAD, Endeavor DES to mid LCX and distal LCX. In January 2009 he had DES to distal LCX, mid LCX and proximal LCX. In November 2009 had BMS x 2 to the mid RCA. Cath 10/2011 with patent stents, noncardiac CP. LHC 01/2013: patent stents (noncardiac CP).  . Marland KitchenRETHRAL STRICTURE 06/24/2009  . DEGENERATIVE JOINT DISEASE 06/24/2009  . SHOULDER PAIN, BILATERAL 06/24/2009  . ANKLE PAIN, RIGHT 06/24/2009  . DIC[place]ISEASE, LUMBAR 04/19/2010  . SCIATICA, LEFT 04/19/2010  . NEPHROLITHIASIS, HX OF 06/24/2009  . ERECTILE DYSFUNCTION, ORGANIC 05/30/2010  . OSA (obstructive sleep apnea)     not using CPAP very often  . Obesity   . Allergy to clopidogrel   . ALLERGIC RHINITIS 06/24/2009  . Anxiety   . Depression     Prior suicide attempt    PAST SURGICAL HISTORY:   Past Surgical History  Procedure Laterality Date  . Tonsillectomy    . Rotator cuff repair      bilateral  . S/p knee replacement  2008  . S/p bilat wrist surgury    . S/p urethral dilations,   multi 2009,2010  . Carpal tunnel release    . S/p right knee arthroscopy  03/2005    and multi prior  . S/p umbilical hernia    . S/p left knee arthroscopy  june 2002    multi prior  . S/p right shoulder rotater cuff surgury  april 2011    Dr. GiGladstone Lighter  SOCIAL HISTORY:   History   Social History  . Marital Status: Divorced    Spouse Name: N/A    Number of Children: 2  . Years of  Education: N/A   Occupational History  . disabled since 2006 due to ortho. heart, psych   . part time work as an Multimedia programmer, wrestling, and baseball Agricultural consultant    Social History Main Topics  . Smoking status: Former Smoker    Types: Cigars    Quit date: 08/28/2010  . Smokeless tobacco: Never Used     Comment: rare  . Alcohol Use: 0.0 oz/week     Comment: occasional  . Drug Use: No  . Sexual Activity: Not Currently   Other Topics Concern  . Not on file   Social History Narrative   Played semi-pro football, used steroids   Divorced moved here from [place], [place]      03/05/2013 AHW "Travis [name]" was born and grew up in [place], [place], and moved to [place], [place] at age 25. He has one sister and 3 brothers. He reports that his childhood was rough. His parents are deceased. He graduated from [employer], and attended one year of college. He is currently unemployed and on disability for multiple medical problems. He has been married 5 times. He has 2 sons. He currently lives alone. 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 He denies that he has any social support system. 03/05/2013 AHW             FAMILY HISTORY:   Family Status  Relation Status Death Age  . Mother Deceased   . Father Deceased   . Brother Alive   . Sister Alive   . Son Alive   . Maternal Grandmother Deceased   . Maternal Grandfather Deceased   . Paternal Grandmother Deceased   . Paternal Grandfather Deceased   . Brother Alive   . Brother Alive   . Son  Alive     ROS:  Has recently lost a lot of weight trying (greater than 100 pounds).  With  the exception of the above, 10 system review of systems was obtained and was unremarkable apart from what is mentioned above.  PHYSICAL EXAMINATION:    VITALS:   Filed Vitals:   03/25/13 1415  BP: 134/70  Pulse: 58  Temp: 97.9 F (36.6 C)  Resp: 16  Height: 6' 1"  (1.854 m)  Weight: 277 lb 4.8 oz (125.782 kg)    GEN:  The patient appears stated age and is in NAD. HEENT:  Normocephalic, atraumatic.  The mucous membranes are moist. The superficial temporal arteries are without ropiness or tenderness. CV:  RRR Lungs:  CTAB Neck/HEME:  There are no carotid bruits bilaterally.  Neurological examination:  Orientation: The patient is alert and oriented x3. Fund of knowledge is appropriate.  Recent and remote memory are intact.  Attention and concentration are normal.    Able to name objects and repeat phrases. Cranial nerves: There is good facial symmetry.  He has eyelid opening apraxia.  Pupils are equal round and reactive to light bilaterally. Fundoscopic exam reveals clear margins bilaterally. Extraocular muscles are intact. The visual fields are full to confrontational testing. The speech is fluent and clear. Soft palate rises symmetrically and there is no tongue deviation. Hearing is intact to conversational tone. Sensation: Sensation is intact to light and pinprick throughout (facial, trunk, extremities). Vibration is intact at the bilateral big toe. There is no extinction with double simultaneous stimulation. There is no sensory dermatomal level identified. Motor: Strength is 5/5 in the left upper extremity and distally in the right upper extremity.  The patient  has limited ability to abduct the shoulder on the right.  Strength in the proximal and distal lower extremities is 4/5 with notable give way weakness.   Shoulder shrug is equal and symmetric.  There is no pronator drift. Deep tendon  reflexes: Deep tendon reflexes are 2/4 at the bilateral biceps, triceps, brachioradialis, patella and absent at the bilateral achilles. Plantar responses are downgoing bilaterally.  Movement examination: Tone: There is normal tone in the upper extremities, but there may be some mild increase with activation procedure on the right.  Tone in the legs and on the left is normal.   Abnormal movements: There is an intermittent distractible tremor in the RUE. Coordination:  There is mild decremation with RAM's, seen on the R with finger taps. Gait and Station: The patient has  difficulty arising out of a deep-seated chair without the use of the hands.  Once standing, the patients gait is very wide based and has an astasia abasia quality.  He is unstable but does not fall.  He does not shuffle.    No results found for this basename: CKTOTAL   Lab Results  Component Value Date   TSH 1.13 05/06/2012   No results found for this basename: VITAMINB12   Lab Results  Component Value Date   WBC 4.8 03/20/2013   HGB 12.7* 03/20/2013   HCT 36.7* 03/20/2013   MCV 91.3 03/20/2013   PLT 211 03/20/2013     Chemistry      Component Value Date/Time   NA 137 03/20/2013 1005   K 3.8 03/20/2013 1005   CL 104 03/20/2013 1005   CO2 25 03/20/2013 1005   BUN 17 03/20/2013 1005   CREATININE 0.80 03/20/2013 1005      Component Value Date/Time   CALCIUM 8.9 03/20/2013 1005   ALKPHOS 58 03/20/2013 1005   AST 17 03/20/2013 1005   ALT 16 03/20/2013 1005   BILITOT 0.5 03/20/2013 1005       ASSESSMENT/PLAN:  1.  New onset tremor and gait changes.  -He has only been on VPA for one day so I don't think that contributes to his c/o.  -While there are some nonphysiologic aspects of the examination, I do note eyelid opening apraxia and I think that MSA is in the ddx.  Because of his very complicated history and multiple other medical problems, including facet steroid use and more recent history of suicide attempt  with Xanax and alcohol, I am going to go ahead and get a DaT scan.  -He will have an EMG of the legs to r/o myopathy.  The pt c/o of both weakness and pain, but he has multiple arthritic sources for pain including the hip and lumbar span and previously been evaluated.  -I do think he could benefit from physical therapy, but we decided to hold off on that until the remainder of the tests are completed.  I do think he needs an ambulatory assistive device at all times, and wrote him a prescription for a cane as I don't think that he will use a walker yet.  -He will have blood work to include TSH, B12, folate, sedimentation rate, CPK, RPR and SPEP/UPEP with immunofixation  -Followup with me will be after the above has been completed.  Time in the room with the patient today was 60 minutes, high complexity.

## 2013-03-26 ENCOUNTER — Other Ambulatory Visit: Payer: Self-pay

## 2013-03-26 DIAGNOSIS — IMO0001 Reserved for inherently not codable concepts without codable children: Secondary | ICD-10-CM

## 2013-03-26 DIAGNOSIS — R251 Tremor, unspecified: Secondary | ICD-10-CM

## 2013-03-26 DIAGNOSIS — R2681 Unsteadiness on feet: Secondary | ICD-10-CM

## 2013-03-26 LAB — TSH: TSH: 0.74 u[IU]/mL (ref 0.35–5.50)

## 2013-03-26 LAB — FOLATE: Folate: 8 ng/mL (ref >5.9)

## 2013-03-26 LAB — SEDIMENTATION RATE: Sed Rate: 11 mm/hr (ref 0–22)

## 2013-03-26 LAB — VITAMIN B12: Vitamin B-12: 318 pg/mL (ref 211–911)

## 2013-03-27 LAB — PROTEIN ELECTROPHORESIS, URINE REFLEX: Total Protein, Urine: 7 mg/dL

## 2013-04-02 ENCOUNTER — Telehealth: Payer: Self-pay | Admitting: Neurology

## 2013-04-02 NOTE — Telephone Encounter (Signed)
Called pt and left voicemail relaying your message.

## 2013-04-02 NOTE — Telephone Encounter (Signed)
I don't have him on the flexeril so it might be better if he talked with prescribing physician.  I am waiting for his tests to be completed before I can do much else (don't know what I am treating or even if its neurologic).  I know that [facility] said it would be a few weeks before they could do the DaT, and you could try [facility] and see if it would be faster.

## 2013-04-02 NOTE — Telephone Encounter (Signed)
I called pt to get more details about what is going on and he said that the flexeril is not working at all. He is having tremors, spasms, and is stuttering when he talks. He is homebound because he is too afraid to drive. Please advise.

## 2013-04-03 ENCOUNTER — Other Ambulatory Visit (INDEPENDENT_AMBULATORY_CARE_PROVIDER_SITE_OTHER): Payer: Medicare HMO

## 2013-04-03 ENCOUNTER — Ambulatory Visit (INDEPENDENT_AMBULATORY_CARE_PROVIDER_SITE_OTHER): Payer: Medicare HMO | Admitting: Internal Medicine

## 2013-04-03 ENCOUNTER — Ambulatory Visit (INDEPENDENT_AMBULATORY_CARE_PROVIDER_SITE_OTHER): Payer: Medicare HMO | Admitting: Psychology

## 2013-04-03 ENCOUNTER — Encounter: Payer: Self-pay | Admitting: Internal Medicine

## 2013-04-03 VITALS — BP 98/60 | HR 99 | Temp 99.1°F | Ht 73.0 in | Wt 261.0 lb

## 2013-04-03 DIAGNOSIS — M79644 Pain in right finger(s): Secondary | ICD-10-CM

## 2013-04-03 DIAGNOSIS — Z0000 Encounter for general adult medical examination without abnormal findings: Secondary | ICD-10-CM

## 2013-04-03 DIAGNOSIS — M79609 Pain in unspecified limb: Secondary | ICD-10-CM

## 2013-04-03 DIAGNOSIS — R269 Unspecified abnormalities of gait and mobility: Secondary | ICD-10-CM

## 2013-04-03 DIAGNOSIS — F331 Major depressive disorder, recurrent, moderate: Secondary | ICD-10-CM

## 2013-04-03 HISTORY — DX: Pain in right finger(s): M79.644

## 2013-04-03 LAB — HEPATIC FUNCTION PANEL
ALT: 26 U/L (ref 0–53)
Albumin: 4.2 g/dL (ref 3.5–5.2)
Bilirubin, Direct: 0.2 mg/dL (ref 0.0–0.3)
Total Bilirubin: 1 mg/dL (ref 0.3–1.2)
Total Protein: 7 g/dL (ref 6.0–8.3)

## 2013-04-03 LAB — CBC WITH DIFFERENTIAL/PLATELET
Eosinophils Absolute: 0.1 10*3/uL (ref 0.0–0.7)
Eosinophils Relative: 1.1 % (ref 0.0–5.0)
Hemoglobin: 13.7 g/dL (ref 13.0–17.0)
Lymphocytes Relative: 15.1 % (ref 12.0–46.0)
Lymphs Abs: 1 10*3/uL (ref 0.7–4.0)
MCHC: 33.7 g/dL (ref 30.0–36.0)
MCV: 92.9 fl (ref 78.0–100.0)
Monocytes Absolute: 0.5 10*3/uL (ref 0.1–1.0)
Monocytes Relative: 8.3 % (ref 3.0–12.0)
Neutro Abs: 4.9 10*3/uL (ref 1.4–7.7)
Neutrophils Relative %: 75.2 % (ref 43.0–77.0)
Platelets: 288 10*3/uL (ref 150.0–400.0)
RBC: 4.39 Mil/uL (ref 4.22–5.81)

## 2013-04-03 LAB — LIPID PANEL
Cholesterol: 105 mg/dL (ref 0–200)
HDL: 39.5 mg/dL (ref >39.00)
Total CHOL/HDL Ratio: 3
Triglycerides: 68 mg/dL (ref 0.0–149.0)
VLDL: 13.6 mg/dL (ref 0.0–40.0)

## 2013-04-03 LAB — PSA: PSA: 0.49 ng/mL (ref 0.10–4.00)

## 2013-04-03 LAB — BASIC METABOLIC PANEL
BUN: 21 mg/dL (ref 6–23)
CO2: 25 mEq/L (ref 19–32)
Calcium: 9.4 mg/dL (ref 8.4–10.5)
Chloride: 103 mEq/L (ref 96–112)
Creatinine, Ser: 1 mg/dL (ref 0.4–1.5)
Glucose, Bld: 108 mg/dL — ABNORMAL HIGH (ref 70–99)

## 2013-04-03 MED ORDER — TIZANIDINE HCL 4 MG PO TABS: 4.0000 mg | ORAL_TABLET | Freq: Four times a day (QID) | ORAL | Status: DC | PRN

## 2013-04-03 NOTE — Patient Instructions (Addendum)
Please check with insurance about covering the shingles shot OK to stop the flexeril Please take all new medication as prescribed - the zanaflex Please continue all other medications as before Please have the pharmacy call with any other refills you may need. Please continue your efforts at being more active, low cholesterol diet, and weight control, as you are able You are otherwise up to date with prevention measures today. You are given the prescription for the walker, to take to a local medical supply store such as [facility] on [address] Please return if you change your mind about having the xray done for the right thumb Please keep your appointments with your specialists as you have planned - the tests, and neurology Please go to the LAB in the Basement (turn left off the elevator) for the tests to be done today You will be contacted by phone if any changes need to be made immediately.  Otherwise, you will receive a letter about your results with an explanation, but please check with MyChart first.  Please remember to sign up for My Chart if you have not done so, as this will be important to you in the future with finding out test results, communicating by private email, and scheduling acute appointments online when needed.  Please return in 6 months, or sooner if needed

## 2013-04-03 NOTE — Assessment & Plan Note (Signed)

## 2013-04-03 NOTE — Assessment & Plan Note (Signed)
With bruise,swelling post fall last night, declines film or other pain med

## 2013-04-03 NOTE — Assessment & Plan Note (Addendum)
With worsening freq recurrent falls per pt; ok for walker rx and change flexeril to zanaflex prn, f/u testing per neuro and neuro f/u as planned

## 2013-04-03 NOTE — Progress Notes (Signed)
Subjective:    Patient ID: Travis [name], male    DOB: [birth year], 62 y.o.   MRN: [id number]  HPI  Here for wellness and f/u;  Overall doing ok;  Pt denies CP, worsening SOB, DOE, wheezing, orthopnea, PND, worsening LE edema, palpitations, dizziness or syncope.  Pt denies neurological change such as new headache, facial or extremity weakness.  Pt denies polydipsia, polyuria, or low sugar symptoms. Pt states overall good compliance with treatment and medications, good tolerability, and has been trying to follow lower cholesterol diet.  Pt denies worsening depressive symptoms, suicidal ideation or panic. No fever, night sweats, wt loss, loss of appetite, or other constitutional symptoms.  Pt states good ability with ADL's, has low fall risk, home safety reviewed and adequate, no other significant changes in hearing or vision, and no excericse recently due to worsening gait and shakiness, ? Worse with anxiety as it seems to wax and wane.  Had recent neuro eval with mult lab testing neg, including MRI head, due now for MRI head with contrast and EMG.  C/o worsening shakiness since the that eval, falls about 5 times per day, not really the cane, tends to fall backwards or to side.   Doesn't think he can cont to care for his dog who has to go out on leash that can pull him over. Fell last night, with large bruise/pain to thumb. Declines film today due to cost.  Flexeril not covered by insuracne, needs change Past Medical History  Diagnosis Date  . Impaired glucose tolerance 09/27/2010  . HYPERLIPIDEMIA-MIXED 07/15/2009  . Chronic pain syndrome 10/27/2009  . HYPERTENSION 06/24/2009  . CORONARY ARTERY DISEASE 06/24/2009    a. s/p multiple PCIs - In 2008 he had a Taxus DES to the mild LAD, Endeavor DES to mid LCX and distal LCX. In January 2009 he had DES to distal LCX, mid LCX and proximal LCX. In November 2009 had BMS x 2 to the mid RCA. Cath 10/2011 with patent stents, noncardiac CP. LHC 01/2013: patent stents  (noncardiac CP).  [name] URETHRAL STRICTURE 06/24/2009  . DEGENERATIVE JOINT DISEASE 06/24/2009  . SHOULDER PAIN, BILATERAL 06/24/2009  . ANKLE PAIN, RIGHT 06/24/2009  . [place] DISEASE, LUMBAR 04/19/2010  . SCIATICA, LEFT 04/19/2010  . NEPHROLITHIASIS, HX OF 06/24/2009  . ERECTILE DYSFUNCTION, ORGANIC 05/30/2010  . OSA (obstructive sleep apnea)     not using CPAP very often  . Obesity   . Allergy to clopidogrel   . ALLERGIC RHINITIS 06/24/2009  . Anxiety   . Depression     Prior suicide attempt   Past Surgical History  Procedure Laterality Date  . Tonsillectomy    . Rotator cuff repair      bilateral  . S/p knee replacement  2008  . S/p bilat wrist surgury    . S/p urethral dilations,  multi 2009,2010  . Carpal tunnel release Bilateral   . S/p right knee arthroscopy  03/2005    and multi prior  . S/p umbilical hernia    . S/p left knee arthroscopy  june 2002    multi prior  . S/p right shoulder rotater cuff surgury  april 2011    Dr. [name] Lighter  . Coronary angioplasty with stent placement      reports that he quit smoking about 2 years ago. His smoking use included Cigars. He has never used smokeless tobacco. He reports that he drinks alcohol. He reports that he does not use illicit drugs. family history includes Cancer in his father  and mother; Coronary artery disease in his other; Depression in his brother, mother, and other; Diabetes in his father; Early death in his paternal grandfather; Early death (age of onset: 51) in his maternal grandfather; Heart disease in his father, maternal grandfather, and mother; Hyperlipidemia in his father; Hypertension in his brother, father, mother, and other. No Known Allergies Current Outpatient Prescriptions on File Prior to Visit  Medication Sig Dispense Refill  . amLODipine (NORVASC) 5 MG tablet Take 5 mg by mouth daily.      [name] aspirin 81 MG tablet Take 1 tablet (81 mg total) by mouth daily.  30 tablet  0  . atorvastatin (LIPITOR) 20 MG tablet Take 20 mg  by mouth at bedtime.      . benazepril (LOTENSIN) 20 MG tablet Take 20 mg by mouth at bedtime.      [name] buPROPion (WELLBUTRIN XL) 300 MG 24 hr tablet TAKE 1 TABLET BY MOUTH DAILY  90 tablet  2  . clonazePAM (KLONOPIN) 1 MG tablet Take 1 tablet (1 mg total) by mouth 3 (three) times daily as needed for anxiety.  90 tablet  2  . divalproex (DEPAKOTE) 250 MG DR tablet Take 1 tablet (250 mg total) by mouth 2 (two) times daily.  60 tablet  0  . EFFIENT 10 MG TABS TAKE 1 TABLET BY MOUTH DAILY  30 tablet  3  . FLUoxetine (PROZAC) 40 MG capsule Take 40 mg by mouth daily. Takes with 19m to equal 692m     . fluticasone (FLONASE) 50 MCG/ACT nasal spray PLACE 1 SPRAY INTO EACH NOSTRIL DAILY  16 g  11  . gabapentin (NEURONTIN) 400 MG capsule Take 800 mg by mouth 4 (four) times daily.      . indomethacin (INDOCIN) 25 MG capsule       . isosorbide mononitrate (IMDUR) 60 MG 24 hr tablet Take 90 mg by mouth daily.      . Marland Kitchenidocaine (LIDODERM) 5 %       . metFORMIN (GLUCOPHAGE) 500 MG tablet Take 1 tablet (500 mg total) by mouth 2 (two) times daily with a meal.      . oxyCODONE (ROXICODONE) 15 MG immediate release tablet Take 1 tablet (15 mg total) by mouth every 6 (six) hours as needed for pain. With limit 4 per day - to fill Mar 23, 2013  120 tablet  0  . traZODone (DESYREL) 50 MG tablet Take 2 tablets (100 mg total) by mouth at bedtime.  60 tablet  5   No current facility-administered medications on file prior to visit.   Review of Systems Constitutional: Negative for diaphoresis, activity change, appetite change or unexpected weight change.  HENT: Negative for hearing loss, ear pain, facial swelling, mouth sores and neck stiffness.   Eyes: Negative for pain, redness and visual disturbance.  Respiratory: Negative for shortness of breath and wheezing.   Cardiovascular: Negative for chest pain and palpitations.  Gastrointestinal: Negative for diarrhea, blood in stool, abdominal distention or other  pain Genitourinary: Negative for hematuria, flank pain or change in urine volume.  Musculoskeletal: Negative for myalgias and joint swelling.  Skin: Negative for color change and wound.  Neurological: Negative for syncope and numbness. other than noted Hematological: Negative for adenopathy.  Psychiatric/Behavioral: Negative for hallucinations, self-injury, decreased concentration and agitation.      Objective:   Physical Exam BP 98/60  Pulse 99  Temp(Src) 99.1 F (37.3 C) (Oral)  Ht 6' 1"  (1.854 m)  Wt 261 lb (  118.389 kg)  BMI 34.44 kg/m2  SpO2 96% VS noted,  Constitutional: Pt is oriented to person, place, and time. Appears well-developed and well-nourished.  Head: Normocephalic and atraumatic.  Right Ear: External ear normal.  Left Ear: External ear normal.  Nose: Nose normal.  Mouth/Throat: Oropharynx is clear and moist.  Eyes: Conjunctivae and EOM are normal. Pupils are equal, round, and reactive to light.  Neck: Normal range of motion. Neck supple. No JVD present. No tracheal deviation present.  Cardiovascular: Normal rate, regular rhythm, normal heart sounds and intact distal pulses.   Pulmonary/Chest: Effort normal and breath sounds normal.  Abdominal: Soft. Bowel sounds are normal. There is no tenderness. No HSM  Musculoskeletal: Normal range of motion. Exhibits no edema.  Lymphadenopathy:  Has no cervical adenopathy.  Neurological: Pt is alert and oriented to person, place, and time. Pt has normal reflexes. No cranial nerve deficit. Has marked tremulousness Skin: Skin is warm and dry. No rash noted.  Right thumb base with 2+ bruise, swelling, tender Psychiatric:  Has  1-2+ nervousmood and affect. Behavior is normal.     Assessment & Plan:

## 2013-04-07 ENCOUNTER — Ambulatory Visit: Payer: Medicare HMO | Admitting: Family Medicine

## 2013-04-08 ENCOUNTER — Telehealth: Payer: Self-pay

## 2013-04-08 MED ORDER — CLONAZEPAM 2 MG PO TABS: 2.0000 mg | ORAL_TABLET | Freq: Two times a day (BID) | ORAL | Status: DC | PRN

## 2013-04-08 NOTE — Telephone Encounter (Signed)
Called the patient informed MD of instructions.  Did call the pharmacy and canceled refills on klonopin 1 mg tid.  Faxed new prescription of klonopin 2 mg bid to [facility].

## 2013-04-08 NOTE — Telephone Encounter (Signed)
At least we're on the right track  Branch to stop the klonopin 1 mg tid  Change to klonopin 2 mg BID (but that's the most I can do) - total 4 mg per day, watch for sedation, and he should not drive taking this much medication; Done hardcopy to robin  Robin to cancel his other refills on the klonopin 1 mg tid rx

## 2013-04-08 NOTE — Telephone Encounter (Signed)
The patient called stating he is taking more klonopin than prescribed per day, but that is the only way hiis shaking will go away.

## 2013-04-09 ENCOUNTER — Encounter (HOSPITAL_COMMUNITY): Payer: Self-pay | Admitting: Psychiatry

## 2013-04-09 ENCOUNTER — Ambulatory Visit (INDEPENDENT_AMBULATORY_CARE_PROVIDER_SITE_OTHER): Payer: Commercial Managed Care - HMO | Admitting: Psychiatry

## 2013-04-09 VITALS — BP 122/78 | HR 85 | Ht 73.0 in | Wt 260.0 lb

## 2013-04-09 DIAGNOSIS — F39 Unspecified mood [affective] disorder: Secondary | ICD-10-CM

## 2013-04-09 MED ORDER — ARIPIPRAZOLE 5 MG PO TABS: 5.0000 mg | ORAL_TABLET | Freq: Every day | ORAL | Status: DC

## 2013-04-09 MED ORDER — DIVALPROEX SODIUM 250 MG PO DR TAB: DELAYED_RELEASE_TABLET | ORAL | Status: DC

## 2013-04-09 NOTE — Progress Notes (Signed)
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   Collection Time    03/17/13  7:24 PM      Result Value Range   Troponin i, poc 0.00  0.00 - 0.08 ng/mL   Comment 3            Comment: Due to the release kinetics of cTnI,     a negative result within the first  hours     of the onset of symptoms does not rule out     myocardial infarction with certainty.     If myocardial infarction is still suspected,     repeat the test at appropriate intervals.  URINE RAPID DRUG SCREEN (HOSP PERFORMED)     Status: Abnormal   Collection Time    03/18/13 12:27 PM      Result Value Range   Opiates NONE DETECTED  NONE DETECTED   Cocaine NONE DETECTED  NONE DETECTED   Benzodiazepines POSITIVE (*) NONE DETECTED   Amphetamines NONE DETECTED  NONE DETECTED   Tetrahydrocannabinol  NONE DETECTED  NONE DETECTED   Barbiturates NONE DETECTED  NONE DETECTED   Comment:            DRUG SCREEN FOR MEDICAL PURPOSES     ONLY.  IF CONFIRMATION IS NEEDED     FOR ANY PURPOSE, NOTIFY LAB     WITHIN 5 DAYS.                LOWEST DETECTABLE LIMITS     FOR URINE DRUG SCREEN     Drug Class       Cutoff (ng/mL)     Amphetamine      1000     Barbiturate      200     Benzodiazepine   093     Tricyclics       818     Opiates          300     Cocaine          300     THC              50  ACETAMINOPHEN LEVEL     Status: None   Collection Time    03/18/13 12:44 PM      Result Value Range   Acetaminophen (Tylenol), Serum <15.0  10 - 30 ug/mL   Comment:            THERAPEUTIC CONCENTRATIONS VARY     SIGNIFICANTLY. A RANGE OF 10-30     ug/mL MAY BE AN EFFECTIVE     CONCENTRATION FOR MANY PATIENTS.     HOWEVER, SOME ARE BEST TREATED     AT CONCENTRATIONS OUTSIDE THIS     RANGE.     ACETAMINOPHEN CONCENTRATIONS     >150 ug/mL AT 4 HOURS AFTER     INGESTION AND >50 ug/mL AT 12     HOURS AFTER INGESTION ARE     OFTEN ASSOCIATED WITH TOXIC     REACTIONS.  CBC     Status: None   Collection Time    03/18/13 12:44 PM      Result Value Range   WBC 5.3  4.0 - 10.5 K/uL   RBC 4.37  4.22 - 5.81 MIL/uL   Hemoglobin 13.6  13.0 - 17.0 g/dL   HCT 40.1  39.0 - 52.0 %   MCV 91.8  78.0 - 100.0 fL   MCH 31.1  26.0 - 34.0 pg   MCHC 33.9  30.0 - 36.0 g/dL   RDW 13.4  11.5 - 15.5 %   Platelets 233  150 - 400 K/uL  COMPREHENSIVE METABOLIC PANEL     Status: Abnormal   Collection Time    03/18/13 12:44 PM      Result Value Range   Sodium 139  135 - 145 mEq/L   Potassium 4.2  3.5 - 5.1 mEq/L  Chloride 107  96 - 112 mEq/L   CO2 22  19 - 32 mEq/L   Glucose, Bld 98  70 - 99 mg/dL   BUN 17  6 - 23 mg/dL   Creatinine, Ser 0.94  0.50 - 1.35 mg/dL   Calcium 9.5  8.4 - 10.5 mg/dL   Total Protein 7.3  6.0 - 8.3 g/dL   Albumin 4.1  3.5 - 5.2 g/dL   AST 20  0 - 37 U/L   ALT 20  0 - 53 U/L    Alkaline Phosphatase 70  39 - 117 U/L   Total Bilirubin 0.4  0.3 - 1.2 mg/dL   GFR calc non Af Amer 89 (*) >90 mL/min   GFR calc Af Amer >90  >90 mL/min   Comment: (NOTE)     The eGFR has been calculated using the CKD EPI equation.     This calculation has not been validated in all clinical situations.     eGFR's persistently <90 mL/min signify possible Chronic Kidney     Disease.  ETHANOL     Status: None   Collection Time    03/18/13 12:44 PM      Result Value Range   Alcohol, Ethyl (B) <11  0 - 11 mg/dL   Comment:            LOWEST DETECTABLE LIMIT FOR     SERUM ALCOHOL IS 11 mg/dL     FOR MEDICAL PURPOSES ONLY  SALICYLATE LEVEL     Status: Abnormal   Collection Time    03/18/13 12:44 PM      Result Value Range   Salicylate Lvl <1.6 (*) 2.8 - 20.0 mg/dL  CBC WITH DIFFERENTIAL     Status: Abnormal   Collection Time    03/20/13 10:05 AM      Result Value Range   WBC 4.8  4.0 - 10.5 K/uL   RBC 4.02 (*) 4.22 - 5.81 MIL/uL   Hemoglobin 12.7 (*) 13.0 - 17.0 g/dL   HCT 36.7 (*) 39.0 - 52.0 %   MCV 91.3  78.0 - 100.0 fL   MCH 31.6  26.0 - 34.0 pg   MCHC 34.6  30.0 - 36.0 g/dL   RDW 13.4  11.5 - 15.5 %   Platelets 211  150 - 400 K/uL   Neutrophils Relative % 67  43 - 77 %   Neutro Abs 3.2  1.7 - 7.7 K/uL   Lymphocytes Relative 22  12 - 46 %   Lymphs Abs 1.1  0.7 - 4.0 K/uL   Monocytes Relative 9  3 - 12 %   Monocytes Absolute 0.4  0.1 - 1.0 K/uL   Eosinophils Relative 2  0 - 5 %   Eosinophils Absolute 0.1  0.0 - 0.7 K/uL   Basophils Relative 0  0 - 1 %   Basophils Absolute 0.0  0.0 - 0.1 K/uL  COMPREHENSIVE METABOLIC PANEL     Status: Abnormal   Collection Time    03/20/13 10:05 AM      Result Value Range   Sodium 137  135 - 145 mEq/L   Potassium 3.8  3.5 - 5.1 mEq/L   Chloride 104  96 - 112 mEq/L   CO2 25  19 - 32 mEq/L   Glucose, Bld 136 (*) 70 - 99 mg/dL   BUN 17  6 - 23 mg/dL   Creatinine, Ser 0.80  0.50 - 1.35 mg/dL   Calcium 8.9  8.4 -  10.5 mg/dL   Total  Protein 6.5  6.0 - 8.3 g/dL   Albumin 3.7  3.5 - 5.2 g/dL   AST 17  0 - 37 U/L   ALT 16  0 - 53 U/L   Alkaline Phosphatase 58  39 - 117 U/L   Total Bilirubin 0.5  0.3 - 1.2 mg/dL   GFR calc non Af Amer >90  >90 mL/min   GFR calc Af Amer >90  >90 mL/min   Comment: (NOTE)     The eGFR has been calculated using the CKD EPI equation.     This calculation has not been validated in all clinical situations.     eGFR's persistently <90 mL/min signify possible Chronic Kidney     Disease.  PROTIME-INR     Status: None   Collection Time    03/20/13 10:05 AM      Result Value Range   Prothrombin Time 13.8  11.6 - 15.2 seconds   INR 1.08  0.00 - 1.49  APTT     Status: None   Collection Time    03/20/13 10:05 AM      Result Value Range   aPTT 29  24 - 37 seconds  SALICYLATE LEVEL     Status: Abnormal   Collection Time    03/20/13 10:05 AM      Result Value Range   Salicylate Lvl <9.2 (*) 2.8 - 20.0 mg/dL  ACETAMINOPHEN LEVEL     Status: None   Collection Time    03/20/13 10:05 AM      Result Value Range   Acetaminophen (Tylenol), Serum <15.0  10 - 30 ug/mL   Comment:            THERAPEUTIC CONCENTRATIONS VARY     SIGNIFICANTLY. A RANGE OF 10-30     ug/mL MAY BE AN EFFECTIVE     CONCENTRATION FOR MANY PATIENTS.     HOWEVER, SOME ARE BEST TREATED     AT CONCENTRATIONS OUTSIDE THIS     RANGE.     ACETAMINOPHEN CONCENTRATIONS     >150 ug/mL AT 4 HOURS AFTER     INGESTION AND >50 ug/mL AT 12     HOURS AFTER INGESTION ARE     OFTEN ASSOCIATED WITH TOXIC     REACTIONS.  POCT I-STAT TROPONIN I     Status: None   Collection Time    03/20/13 10:19 AM      Result Value Range   Troponin i, poc 0.01  0.00 - 0.08 ng/mL   Comment 3            Comment: Due to the release kinetics of cTnI,     a negative result within the first hours     of the onset of symptoms does not rule out     myocardial infarction with certainty.     If myocardial infarction is still suspected,     repeat the test  at appropriate intervals.  CK     Status: None   Collection Time    03/25/13  3:39 PM      Result Value Range   Total CK 60  7 - 232 U/L  TSH     Status: None   Collection Time    03/25/13  3:39 PM      Result Value Range   TSH 0.74  0.35 - 5.50 uIU/mL  VITAMIN B12     Status: None   Collection Time  03/25/13  3:39 PM      Result Value Range   Vitamin B-12 318  211 - 911 pg/mL  FOLATE     Status: None   Collection Time    03/25/13  3:39 PM      Result Value Range   Folate 8.0  >5.9 ng/mL  SEDIMENTATION RATE     Status: None   Collection Time    03/25/13  3:39 PM      Result Value Range   Sed Rate 11  0 - 22 mm/hr  PROTEIN ELECTROPHORESIS, URINE REFLEX     Status: None   Collection Time    03/25/13  3:40 PM      Result Value Range   Total Protein, Urine 7     Comment: No established reference range.   Total Protein, Urine/Day NOT CALC  50 - 100 mg/day   Albumin Not Applicable     [id number], U Not Applicable     [id number], U Not Applicable     Beta Globulin, U Not Applicable     Gamma Globulin, U Not Applicable     Monoclonal Band 1 NONE DET     Monoclonal Band 2 NONE DET     Interpretation       Comment: No significant protein identified.     Reviewed by Odis Hollingshead, MD, PhD, FCAP (Electronic Signature on     File)  LIPID PANEL     Status: None   Collection Time    04/03/13  1:59 PM      Result Value Range   Cholesterol 105  0 - 200 mg/dL   Comment: ATP III Classification       Desirable:  < 200 mg/dL               Borderline High:  200 - 239 mg/dL          High:  > = 240 mg/dL   Triglycerides 68.0  0.0 - 149.0 mg/dL   Comment: Normal:  <150 mg/dLBorderline High:  150 - 199 mg/dL   HDL 39.50  >39.00 mg/dL   VLDL 13.6  0.0 - 40.0 mg/dL   LDL Cholesterol 52  0 - 99 mg/dL   Total CHOL/HDL Ratio 3     Comment:                Men          Women1/2 Average Risk     3.4          3.3Average Risk          5.0          4.42X Average Risk           9.6          7.13X Average Risk          15.0          11.0                      BASIC METABOLIC PANEL     Status: Abnormal   Collection Time    04/03/13  1:59 PM      Result Value Range   Sodium 135  135 - 145 mEq/L   Potassium 4.4  3.5 - 5.1 mEq/L   Chloride 103  96 - 112 mEq/L   CO2 25  19 - 32 mEq/L   Glucose, Bld 108 (*) 70 - 99 mg/dL   BUN  21  6 - 23 mg/dL   Creatinine, Ser 1.0  0.4 - 1.5 mg/dL   Calcium 9.4  8.4 - 10.5 mg/dL   GFR 83.67  >60.00 mL/min  HEPATIC FUNCTION PANEL     Status: None   Collection Time    04/03/13  1:59 PM      Result Value Range   Total Bilirubin 1.0  0.3 - 1.2 mg/dL   Bilirubin, Direct 0.2  0.0 - 0.3 mg/dL   Alkaline Phosphatase 65  39 - 117 U/L   AST 22  0 - 37 U/L   ALT 26  0 - 53 U/L   Total Protein 7.0  6.0 - 8.3 g/dL   Albumin 4.2  3.5 - 5.2 g/dL  CBC WITH DIFFERENTIAL     Status: None   Collection Time    04/03/13  1:59 PM      Result Value Range   WBC 6.5  4.5 - 10.5 K/uL   RBC 4.39  4.22 - 5.81 Mil/uL   Hemoglobin 13.7  13.0 - 17.0 g/dL   HCT 40.8  39.0 - 52.0 %   MCV 92.9  78.0 - 100.0 fl   MCHC 33.7  30.0 - 36.0 g/dL   RDW 13.7  11.5 - 14.6 %   Platelets 288.0  150.0 - 400.0 K/uL   Neutrophils Relative % 75.2  43.0 - 77.0 %   Lymphocytes Relative 15.1  12.0 - 46.0 %   Monocytes Relative 8.3  3.0 - 12.0 %   Eosinophils Relative 1.1  0.0 - 5.0 %   Basophils Relative 0.3  0.0 - 3.0 %   Neutro Abs 4.9  1.4 - 7.7 K/uL   Lymphs Abs 1.0  0.7 - 4.0 K/uL   Monocytes Absolute 0.5  0.1 - 1.0 K/uL   Eosinophils Absolute 0.1  0.0 - 0.7 K/uL   Basophils Absolute 0.0  0.0 - 0.1 K/uL  PSA     Status: None   Collection Time    04/03/13  1:59 PM      Result Value Range   PSA 0.49  0.10 - 4.00 ng/mL   Musculoskeletal: Strength & Muscle Tone: decreased Gait & Station: Difficulty walking due to pain Patient leans: Front  Mental Status Examination;  Patient is casually dressed and fairly groomed.  He maintains fair eye contact.  His  speech is stuttering but coherent.  He described his mood as irritable and his affect is mood appropriate.  He denies any suicidal thoughts or homicidal thoughts but endorse auditory hallucination when he goes to sleep.  Denies any visual hallucination.  He has tremors and shakes . His fund of knowledge is adequate.  His psychomotor activity is slow.  He is alert and oriented x3.  His insight judgment and impulse control is fair.   Medical Decision Making (Choose Three): Established Problem, Stable/Improving (1), Review of Psycho-Social Stressors (1), Review or order clinical lab tests (1), Review of Last Therapy Session (1), Review of Medication Regimen & Side Effects (2) and Review of New Medication or Change in Dosage (2)  Assessment: Axis I: Mood disorder NOS, rule out bipolar disorder depressed type  Axis II: Deferred  Axis III:  Past Medical History  Diagnosis Date  . Impaired glucose tolerance 09/27/2010  . HYPERLIPIDEMIA-MIXED 07/15/2009  . Chronic pain syndrome 10/27/2009  . HYPERTENSION 06/24/2009  . CORONARY ARTERY DISEASE 06/24/2009    a. s/p multiple PCIs - In 2008 he had a Taxus DES to the mild  LAD, Endeavor DES to mid LCX and distal LCX. In January 2009 he had DES to distal LCX, mid LCX and proximal LCX. In November 2009 had BMS x 2 to the mid RCA. Cath 10/2011 with patent stents, noncardiac CP. LHC 01/2013: patent stents (noncardiac CP).  Travis [name] URETHRAL STRICTURE 06/24/2009  . DEGENERATIVE JOINT DISEASE 06/24/2009  . SHOULDER PAIN, BILATERAL 06/24/2009  . ANKLE PAIN, RIGHT 06/24/2009  . [place] DISEASE, LUMBAR 04/19/2010  . SCIATICA, LEFT 04/19/2010  . NEPHROLITHIASIS, HX OF 06/24/2009  . ERECTILE DYSFUNCTION, ORGANIC 05/30/2010  . OSA (obstructive sleep apnea)     not using CPAP very often  . Obesity   . Allergy to clopidogrel   . ALLERGIC RHINITIS 06/24/2009  . Anxiety   . Depression     Prior suicide attempt    Axis IV: Moderate   Plan:  I recommended to stop Wellbutrin as patient is  taking Prozac and trazodone and recently Klonopin is increased to 2 mg twice a day by his primary care physician.  I would increase his Depakote from 250 milligram twice a day to 250 mg in the morning and 500 mg at bedtime.  I will also try Abilify 5 mg daily at bedtime.  Recommended to stop Ambien .  Patient is taking pain medication and multiple muscle relaxant.  Recommend to see his therapist Dr. Octavia Bruckner for coping and social skills.  Recommend to stop drinking. Discussed in detail the safety plan.  Discuss in detail the risks and benefits of medication and side effects.  Followup in 3 weeks.  Time spent 25 minutes.  Recommend to cause back if he is any question or concern.  Daliana Leverett T., MD 04/09/2013

## 2013-04-12 ENCOUNTER — Other Ambulatory Visit (HOSPITAL_COMMUNITY): Payer: Self-pay | Admitting: Psychiatry

## 2013-04-14 ENCOUNTER — Ambulatory Visit (INDEPENDENT_AMBULATORY_CARE_PROVIDER_SITE_OTHER): Payer: Medicare HMO | Admitting: Neurology

## 2013-04-14 ENCOUNTER — Encounter: Payer: Self-pay | Admitting: Neurology

## 2013-04-14 DIAGNOSIS — IMO0002 Reserved for concepts with insufficient information to code with codable children: Secondary | ICD-10-CM

## 2013-04-14 DIAGNOSIS — G609 Hereditary and idiopathic neuropathy, unspecified: Secondary | ICD-10-CM

## 2013-04-14 DIAGNOSIS — M5417 Radiculopathy, lumbosacral region: Secondary | ICD-10-CM

## 2013-04-14 DIAGNOSIS — R269 Unspecified abnormalities of gait and mobility: Secondary | ICD-10-CM

## 2013-04-14 DIAGNOSIS — R2681 Unsteadiness on feet: Secondary | ICD-10-CM

## 2013-04-14 NOTE — Procedures (Signed)
[facility]  [address]  [place], [place] [address] Tel: [phone] Fax:  [phone] Test Date:  04/14/2013  Patient: Travis [name] DOB: Mar 11, 1951 Physician: Narda Amber, DO  Sex: Male Height: 6' 1"  Ref Phys: Alonza Bogus  ID#: [id number] Temp: 33.6C Technician:    Patient Complaints: This is a 62 year-old man presenting with bilateral leg weakness and falls.  NCV & EMG Findings: Extensive evaluation of the right and left lower extremities reveals: 1. Absent sural and superficial peroneal responses bilaterally. 2. The right peroneal motor nerve showed reduced amplitude (1.9 mV) at the ankle and normal response when recorded at tibialis anterior.  The left peroneal motor nerve recorded at the extensor digitorum brevis is normal. This asymmetry may be due to local compression from the ankle boot that the patient wears on the right foot. 3. Tibial motor response was absent bilaterally. 4. Bilateral H-reflexes are prolonged. 5. Needle electrode examination shows chronic motor axon loss changes affecting the intrinsic foot muscles and bilateral L5-myotomes.  There is no active denervation.    Impression: 1. 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  2. Chronic bilateral L5 radiculopathies, mild in degree electrically.     ___________________________ Narda Amber, DO    Nerve Conduction Studies Anti Sensory Summary Table   Site NR Peak (ms) Norm Peak (ms) P-T Amp (V) Norm P-T Amp  Left Sup Peroneal Anti Sensory (Ant Lat Mall)  12 cm NR  <4.6  >3  Right Sup Peroneal Anti Sensory (Ant Lat Mall)  12 cm NR  <4.6  >3  Left Sural Anti Sensory (Lat Mall)  Calf NR  <4.6  >3  Right Sural Anti Sensory (Lat Mall)  Calf NR  <4.6  >3   Motor Summary Table   Site NR Onset  (ms) Norm Onset (ms) O-P Amp (mV) Norm O-P Amp Site1 Site2 Delta-0 (ms) Dist (cm) Vel (m/s) Norm Vel (m/s)  Left Peroneal Motor (Ext Dig Brev)  Ankle    5.1 <6.0 4.2 >2.5 B Fib Ankle 11.2 45.0 40 >40  B Fib    16.3  3.5  Poplt B Fib 0.5 8.0 160 >40  Poplt    16.8  3.4         Right Peroneal Motor (Ext Dig Brev)  Ankle    5.2 <6.0 1.9 >2.5 B Fib Ankle 11.1 44.0 40 >40  B Fib    16.3  1.7  Poplt B Fib 1.0 10.0 100 >40  Poplt    17.3  1.7         Right Peroneal TA Motor (Tib Ant)  Fib Head    3.8 <4.5 3.0 >3 Poplit Fib Head 2.1 10.0 48 >40  Poplit    5.9  2.7         Left Tibial Motor (Abd Hall Brev)  Ankle NR  <6.0  >4 Knee Ankle  0.0  >40  Knee NR            Right Tibial Motor (Abd Hall Brev)  Ankle NR  <6.0  >4 Knee Ankle  0.0  >40  Knee NR             H Reflex Studies   NR H-Lat (ms) Lat Norm (ms) L-R H-Lat (ms)  Left Tibial (Gastroc)     41.33 <35 0.00  Right Tibial (Gastroc)  41.33 <35 0.00   EMG   Side Muscle Ins Act Fibs Psw Fasc Number Recrt Dur Dur. Amp Amp. Poly Poly. Comment  Left AntTibialis Nml Nml Nml Nml 1- Mod-R Few 1+ Few 1+ Nml Nml N/A  Left Flex Dig Long Nml Nml Nml Nml 2- Mod-R Few 1+ Few 1+ Nml Nml N/A  Left Ext Dig Brev Nml Nml Nml Nml 2- Rapid Some 1+ Some 1+ Nml Nml N/A  Left AbdHallucis Nml Nml Nml Nml 2- Rapid Some 1+ Some 1+ Nml Nml N/A  Left GluteusMed Nml Nml Nml Nml 1- Mod Few 1+ Nml Nml Nml Nml N/A  Left RectFemoris Nml Nml Nml Nml Nml Nml Nml Nml Nml Nml Nml Nml N/A  Left Gastroc Nml Nml Nml Nml 1- Mod Nml Nml Nml Nml Nml Nml N/A  Left BicepsFemS Nml Nml Nml Nml Nml Nml Nml Nml Nml Nml Nml Nml N/A  Right AntTibialis Nml Nml Nml Nml 1- Mod Few 1+ Nml Nml Nml Nml N/A  Right Ext Dig Brev Nml Nml Nml Nml [facility] Rapid All 1+ Nml Nml Nml Nml N/A  Right GluteusMed Nml Nml Nml Nml 1- Mod Few 1+ Nml Nml Nml Nml N/A  Right Flex Dig Long Nml Nml Nml Nml 1- Mod-R Few 1+ Nml Nml Nml Nml N/A  Right AbdHallucis Nml Nml Nml Nml NE Rapid Some 1+ Nml Nml Nml Nml  N/A  Right Lumbo Parasp Low Nml Nml Nml Nml NE - - - - - - - N/A  Right BicepsFemS Nml Nml Nml Nml Nml Nml Nml Nml Nml Nml Nml Nml N/A  Right RectFemoris Nml Nml Nml Nml Nml Nml Nml Nml Nml Nml Nml Nml N/A  Right Gastroc Nml Nml Nml Nml Nml Nml Nml Nml Nml Nml Nml Nml N/A     Waveforms:

## 2013-04-14 NOTE — Progress Notes (Signed)
See procedure note for EMG results.  Halvor Behrend K. Posey Pronto, DO

## 2013-04-16 ENCOUNTER — Ambulatory Visit: Payer: Commercial Managed Care - HMO | Admitting: Psychology

## 2013-04-16 ENCOUNTER — Other Ambulatory Visit: Payer: Self-pay

## 2013-04-16 MED ORDER — OXYCODONE HCL 15 MG PO TABS: 15.0000 mg | ORAL_TABLET | Freq: Four times a day (QID) | ORAL | Status: DC | PRN

## 2013-04-16 NOTE — Telephone Encounter (Signed)
Done hardcopy to robin, also to let pt know:  You are given the letter today explaining the transitional pain medication refill policy due to recent change in [place] law and [place] regulations  Please be aware that I will no longer be able to offer monthly refills of any Schedule II or higher medication starting Jun 22, 2013

## 2013-04-16 NOTE — Telephone Encounter (Signed)
Called the patient informed hardcopy's are ready for pickup and enclosed letter as well.

## 2013-04-24 ENCOUNTER — Telehealth: Payer: Self-pay | Admitting: Internal Medicine

## 2013-04-24 ENCOUNTER — Telehealth: Payer: Self-pay

## 2013-04-24 ENCOUNTER — Other Ambulatory Visit: Payer: Self-pay | Admitting: Internal Medicine

## 2013-04-24 DIAGNOSIS — G894 Chronic pain syndrome: Secondary | ICD-10-CM

## 2013-04-24 NOTE — Telephone Encounter (Signed)
The patient would like a pain management referral to Dr. Nicholaus Bloom.

## 2013-04-24 NOTE — Telephone Encounter (Signed)
Pt is requesting to switch PCP from Dr. Jenny Reichmann to Dr. Alain Marion due to taking oxycodone and other pain meds.  Dr. Alain Marion sees his brother.

## 2013-04-24 NOTE — Telephone Encounter (Signed)
Done per emr 

## 2013-04-24 NOTE — Telephone Encounter (Signed)
I'm sorry - I'm not able to accommodate new pain patients Thx

## 2013-04-25 NOTE — Telephone Encounter (Signed)
Pt is aware of Dr. Ronnald Ramp decision and will stay with Dr. Jenny Reichmann.  He is being referred to a Pain Clinic.

## 2013-04-30 ENCOUNTER — Telehealth: Payer: Self-pay

## 2013-04-30 ENCOUNTER — Ambulatory Visit (HOSPITAL_COMMUNITY): Payer: Self-pay | Admitting: Psychiatry

## 2013-04-30 ENCOUNTER — Encounter (HOSPITAL_COMMUNITY): Payer: Self-pay | Admitting: Psychiatry

## 2013-04-30 VITALS — BP 146/88 | HR 78 | Ht 73.0 in | Wt 267.0 lb

## 2013-04-30 DIAGNOSIS — F39 Unspecified mood [affective] disorder: Secondary | ICD-10-CM

## 2013-04-30 MED ORDER — FLUOXETINE HCL 40 MG PO CAPS: 40.0000 mg | ORAL_CAPSULE | Freq: Every day | ORAL | Status: DC

## 2013-04-30 MED ORDER — TRAZODONE HCL 100 MG PO TABS: 100.0000 mg | ORAL_TABLET | Freq: Every day | ORAL | Status: DC

## 2013-04-30 MED ORDER — ARIPIPRAZOLE 5 MG PO TABS: 5.0000 mg | ORAL_TABLET | Freq: Every day | ORAL | Status: DC

## 2013-04-30 NOTE — Telephone Encounter (Signed)
refill 

## 2013-04-30 NOTE — Progress Notes (Signed)
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 mL/min signify possible Chronic Kidney     Disease.  ETHANOL     Status:  None   Collection Time    03/18/13 12:44 PM      Result Value Range   Alcohol, Ethyl (B) <11  0 - 11 mg/dL   Comment:            LOWEST DETECTABLE LIMIT FOR     SERUM ALCOHOL IS 11 mg/dL     FOR MEDICAL PURPOSES ONLY  SALICYLATE LEVEL     Status: Abnormal   Collection Time    03/18/13 12:44 PM      Result Value Range   Salicylate Lvl <2.4 (*) 2.8 - 20.0 mg/dL  CBC WITH DIFFERENTIAL     Status: Abnormal   Collection Time    03/20/13 10:05 AM      Result Value Range   WBC 4.8  4.0 - 10.5 K/uL   RBC 4.02 (*) 4.22 - 5.81 MIL/uL   Hemoglobin 12.7 (*) 13.0 - 17.0 g/dL   HCT 36.7 (*) 39.0 - 52.0 %   MCV 91.3  78.0 - 100.0 fL   MCH 31.6  26.0 - 34.0  pg   MCHC 34.6  30.0 - 36.0 g/dL   RDW 13.4  11.5 - 15.5 %   Platelets 211  150 - 400 K/uL   Neutrophils Relative % 67  43 - 77 %   Neutro Abs 3.2  1.7 - 7.7 K/uL   Lymphocytes Relative 22  12 - 46 %   Lymphs Abs 1.1  0.7 - 4.0 K/uL   Monocytes Relative 9  3 - 12 %   Monocytes Absolute 0.4  0.1 - 1.0 K/uL   Eosinophils Relative 2  0 - 5 %   Eosinophils Absolute 0.1  0.0 - 0.7 K/uL   Basophils Relative 0  0 - 1 %   Basophils Absolute 0.0  0.0 - 0.1 K/uL  COMPREHENSIVE METABOLIC PANEL     Status: Abnormal   Collection Time    03/20/13 10:05 AM      Result Value Range   Sodium 137  135 - 145 mEq/L   Potassium 3.8  3.5 - 5.1 mEq/L   Chloride 104  96 - 112 mEq/L   CO2 25  19 - 32 mEq/L   Glucose, Bld 136 (*) 70 - 99 mg/dL   BUN 17  6 - 23 mg/dL   Creatinine, Ser 0.80  0.50 - 1.35 mg/dL   Calcium 8.9  8.4 - 10.5 mg/dL   Total Protein 6.5  6.0 - 8.3 g/dL   Albumin 3.7  3.5 - 5.2 g/dL   AST 17  0 - 37 U/L   ALT 16  0 - 53 U/L   Alkaline Phosphatase 58  39 - 117 U/L   Total Bilirubin 0.5  0.3 - 1.2 mg/dL   GFR calc non Af Amer >90  >90 mL/min   GFR calc Af Amer >90  >90 mL/min   Comment: (NOTE)     The eGFR has been calculated using the CKD EPI equation.     This calculation has not been validated in all clinical situations.     eGFR's persistently <90 mL/min signify possible Chronic Kidney     Disease.  PROTIME-INR     Status: None   Collection Time    03/20/13 10:05 AM      Result Value Range   Prothrombin Time 13.8  11.6 - 15.2 seconds   INR 1.08  0.00 - 1.49  APTT     Status: None   Collection Time  03/20/13 10:05 AM      Result Value Range   aPTT 29  24 - 37 seconds  SALICYLATE LEVEL     Status: Abnormal   Collection Time    03/20/13 10:05 AM      Result Value Range   Salicylate Lvl <0.6 (*) 2.8 - 20.0 mg/dL  ACETAMINOPHEN LEVEL     Status: None   Collection Time    03/20/13 10:05 AM      Result Value Range   Acetaminophen (Tylenol), Serum <15.0  10 - 30 ug/mL    Comment:            THERAPEUTIC CONCENTRATIONS VARY     SIGNIFICANTLY. A RANGE OF 10-30     ug/mL MAY BE AN EFFECTIVE     CONCENTRATION FOR MANY PATIENTS.     HOWEVER, SOME ARE BEST TREATED     AT CONCENTRATIONS OUTSIDE THIS     RANGE.     ACETAMINOPHEN CONCENTRATIONS     >150 ug/mL AT 4 HOURS AFTER     INGESTION AND >50 ug/mL AT 12     HOURS AFTER INGESTION ARE     OFTEN ASSOCIATED WITH TOXIC     REACTIONS.  POCT I-STAT TROPONIN I     Status: None   Collection Time    03/20/13 10:19 AM      Result Value Range   Troponin i, poc 0.01  0.00 - 0.08 ng/mL   Comment 3            Comment: Due to the release kinetics of cTnI,     a negative result within the first hours     of the onset of symptoms does not rule out     myocardial infarction with certainty.     If myocardial infarction is still suspected,     repeat the test at appropriate intervals.  CK     Status: None   Collection Time    03/25/13  3:39 PM      Result Value Range   Total CK 60  7 - 232 U/L  TSH     Status: None   Collection Time    03/25/13  3:39 PM      Result Value Range   TSH 0.74  0.35 - 5.50 uIU/mL  VITAMIN B12     Status: None   Collection Time    03/25/13  3:39 PM      Result Value Range   Vitamin B-12 318  211 - 911 pg/mL  FOLATE     Status: None   Collection Time    03/25/13  3:39 PM      Result Value Range   Folate 8.0  >5.9 ng/mL  SEDIMENTATION RATE     Status: None   Collection Time    03/25/13  3:39 PM      Result Value Range   Sed Rate 11  0 - 22 mm/hr  PROTEIN ELECTROPHORESIS, URINE REFLEX     Status: None   Collection Time    03/25/13  3:40 PM      Result Value Range   Total Protein, Urine 7     Comment: No established reference range.   Total Protein, Urine/Day NOT CALC  50 - 100 mg/day   Albumin Not Applicable     [id number], U Not Applicable     [id number]-[id number], U Not Applicable     Beta Globulin, U Not Applicable     Gamma Globulin, U  Not Applicable      Monoclonal Band 1 NONE DET     Monoclonal Band 2 NONE DET     Interpretation       Comment: No significant protein identified.     Reviewed by Odis Hollingshead, MD, PhD, FCAP (Electronic Signature on     File)  LIPID PANEL     Status: None   Collection Time    04/03/13  1:59 PM      Result Value Range   Cholesterol 105  0 - 200 mg/dL   Comment: ATP III Classification       Desirable:  < 200 mg/dL               Borderline High:  200 - 239 mg/dL          High:  > = 240 mg/dL   Triglycerides 68.0  0.0 - 149.0 mg/dL   Comment: Normal:  <150 mg/dLBorderline High:  150 - 199 mg/dL   HDL 39.50  >39.00 mg/dL   VLDL 13.6  0.0 - 40.0 mg/dL   LDL Cholesterol 52  0 - 99 mg/dL   Total CHOL/HDL Ratio 3     Comment:                Men          Women1/2 Average Risk     3.4          3.3Average Risk          5.0          4.42X Average Risk          9.6          7.13X Average Risk          15.0          11.0                      BASIC METABOLIC PANEL     Status: Abnormal   Collection Time    04/03/13  1:59 PM      Result Value Range   Sodium 135  135 - 145 mEq/L   Potassium 4.4  3.5 - 5.1 mEq/L   Chloride 103  96 - 112 mEq/L   CO2 25  19 - 32 mEq/L   Glucose, Bld 108 (*) 70 - 99 mg/dL   BUN 21  6 - 23 mg/dL   Creatinine, Ser 1.0  0.4 - 1.5 mg/dL   Calcium 9.4  8.4 - 10.5 mg/dL   GFR 83.67  >60.00 mL/min  HEPATIC FUNCTION PANEL     Status: None   Collection Time    04/03/13  1:59 PM      Result Value Range   Total Bilirubin 1.0  0.3 - 1.2 mg/dL   Bilirubin, Direct 0.2  0.0 - 0.3 mg/dL   Alkaline Phosphatase 65  39 - 117 U/L   AST 22  0 - 37 U/L   ALT 26  0 - 53 U/L   Total Protein 7.0  6.0 - 8.3 g/dL   Albumin 4.2  3.5 - 5.2 g/dL  CBC WITH DIFFERENTIAL     Status: None   Collection Time    04/03/13  1:59 PM      Result Value Range   WBC 6.5  4.5 - 10.5 K/uL   RBC 4.39  4.22 - 5.81 Mil/uL   Hemoglobin 13.7  13.0 - 17.0 g/dL   HCT 40.8  39.0 - 52.0 %   MCV 92.9  78.0 - 100.0 fl    MCHC 33.7  30.0 - 36.0 g/dL   RDW 13.7  11.5 - 14.6 %   Platelets 288.0  150.0 - 400.0 K/uL   Neutrophils Relative % 75.2  43.0 - 77.0 %   Lymphocytes Relative 15.1  12.0 - 46.0 %   Monocytes Relative 8.3  3.0 - 12.0 %   Eosinophils Relative 1.1  0.0 - 5.0 %   Basophils Relative 0.3  0.0 - 3.0 %   Neutro Abs 4.9  1.4 - 7.7 K/uL   Lymphs Abs 1.0  0.7 - 4.0 K/uL   Monocytes Absolute 0.5  0.1 - 1.0 K/uL   Eosinophils Absolute 0.1  0.0 - 0.7 K/uL   Basophils Absolute 0.0  0.0 - 0.1 K/uL  PSA     Status: None   Collection Time    04/03/13  1:59 PM      Result Value Range   PSA 0.49  0.10 - 4.00 ng/mL   Musculoskeletal: Strength & Muscle Tone: decreased Gait & Station: Difficulty walking due to pain Patient leans: Front  Mental Status Examination;  Patient is casually dressed and fairly groomed.  He maintains fair eye contact.  His speech is coherent.  He described his mood as good and his affect is improved from the past.  He denies any suicidal thoughts or homicidal thoughts.  He denies any auditory hallucination or visual hallucination.  There were no paranoia , delusions or any of the session.  He denies any tremors or shakes.  His fund of knowledge is adequate.  He is alert and oriented x3.  His insight judgment and impulse control is fair.   Medical Decision Making (Choose Three): Established Problem, Stable/Improving (1), Review of Psycho-Social Stressors (1), Review or order clinical lab tests (1), Review of Last Therapy Session (1), Review of Medication Regimen & Side Effects (2) and Review of New Medication or Change in Dosage (2)  Assessment: Axis I: Mood disorder NOS, rule out bipolar disorder depressed type  Axis II: Deferred  Axis III:  Past Medical History  Diagnosis Date  . Impaired glucose tolerance 09/27/2010  . HYPERLIPIDEMIA-MIXED 07/15/2009  . Chronic pain syndrome 10/27/2009  . HYPERTENSION 06/24/2009  . CORONARY ARTERY DISEASE 06/24/2009    a. s/p multiple PCIs - In  2008 he had a Taxus DES to the mild LAD, Endeavor DES to mid LCX and distal LCX. In January 2009 he had DES to distal LCX, mid LCX and proximal LCX. In November 2009 had BMS x 2 to the mid RCA. Cath 10/2011 with patent stents, noncardiac CP. LHC 01/2013: patent stents (noncardiac CP).  [name] URETHRAL STRICTURE 06/24/2009  . DEGENERATIVE JOINT DISEASE 06/24/2009  . SHOULDER PAIN, BILATERAL 06/24/2009  . ANKLE PAIN, RIGHT 06/24/2009  . [place] DISEASE, LUMBAR 04/19/2010  . SCIATICA, LEFT 04/19/2010  . NEPHROLITHIASIS, HX OF 06/24/2009  . ERECTILE DYSFUNCTION, ORGANIC 05/30/2010  . OSA (obstructive sleep apnea)     not using CPAP very often  . Obesity   . Allergy to clopidogrel   . ALLERGIC RHINITIS 06/24/2009  . Anxiety   . Depression     Prior suicide attempt    Axis IV: Moderate   Plan:  Is doing much better since we started him on Abilify.  I will continue Abilify 5 mg daily.  Continue Prozac 40 mg daily and trazodone 100 mg at bedtime.  He is getting Klonopin from his primary care  physician.  I discussed benzodiazepine tolerance, dependence and withdrawal symptoms.  He is also taking Depakote 750 mg a day.  I recommend because it is any question or any concern.  Recommend to see his therapist Dr. Cheryln Manly for counseling.  Followup in 3 months. Discussed in detail the safety plan.  Discuss in detail the risks and benefits of medication and side effects.  Followup in 3 months.  Time spent 25 minutes.  Recommend to cause back if he is any question or concern.  Genavieve Mangiapane T., MD 04/30/2013

## 2013-05-01 ENCOUNTER — Ambulatory Visit (INDEPENDENT_AMBULATORY_CARE_PROVIDER_SITE_OTHER): Payer: Medicare HMO | Admitting: Internal Medicine

## 2013-05-01 VITALS — BP 122/62 | HR 75 | Temp 99.1°F | Ht 73.0 in | Wt 268.0 lb

## 2013-05-01 DIAGNOSIS — G4733 Obstructive sleep apnea (adult) (pediatric): Secondary | ICD-10-CM

## 2013-05-01 DIAGNOSIS — J069 Acute upper respiratory infection, unspecified: Secondary | ICD-10-CM | POA: Insufficient documentation

## 2013-05-01 DIAGNOSIS — L989 Disorder of the skin and subcutaneous tissue, unspecified: Secondary | ICD-10-CM | POA: Insufficient documentation

## 2013-05-01 MED ORDER — AZITHROMYCIN 250 MG PO TABS: ORAL_TABLET | ORAL | Status: DC

## 2013-05-01 NOTE — Patient Instructions (Signed)
Please take all new medication as prescribed - the antibiotic  Please continue all other medications as before  Please have the pharmacy call with any other refills you may need.  You will be contacted regarding the referral for: pulmonary, and dermatology  Please keep your appointments with your specialists as you have planned

## 2013-05-01 NOTE — Progress Notes (Signed)
Subjective:    Patient ID: Travis [name], male    DOB: 15-May-1951, 62 y.o.   MRN: [id number]  HPI   Here with 2-3 days acute onset fever, facial pain, pressure, headache, general weakness and malaise, and greenish d/c, with mild ST and cough, but pt denies chest pain, wheezing, increased sob or doe, orthopnea, PND, increased LE swelling, palpitations, dizziness or syncope. Has a skin lesion to right cheek that has ? Enlarged, dark, ? Slightly raised, asks for derm referral.  Also has ongoing OSA symptoms, home machine not working well, needs f/u but does not have local MD for this. Past Medical History  Diagnosis Date  . Impaired glucose tolerance 09/27/2010  . HYPERLIPIDEMIA-MIXED 07/15/2009  . Chronic pain syndrome 10/27/2009  . HYPERTENSION 06/24/2009  . CORONARY ARTERY DISEASE 06/24/2009    a. s/p multiple PCIs - In 2008 he had a Taxus DES to the mild LAD, Endeavor DES to mid LCX and distal LCX. In January 2009 he had DES to distal LCX, mid LCX and proximal LCX. In November 2009 had BMS x 2 to the mid RCA. Cath 10/2011 with patent stents, noncardiac CP. LHC 01/2013: patent stents (noncardiac CP).  [name] URETHRAL STRICTURE 06/24/2009  . DEGENERATIVE JOINT DISEASE 06/24/2009  . SHOULDER PAIN, BILATERAL 06/24/2009  . ANKLE PAIN, RIGHT 06/24/2009  . [place] DISEASE, LUMBAR 04/19/2010  . SCIATICA, LEFT 04/19/2010  . NEPHROLITHIASIS, HX OF 06/24/2009  . ERECTILE DYSFUNCTION, ORGANIC 05/30/2010  . OSA (obstructive sleep apnea)     not using CPAP very often  . Obesity   . Allergy to clopidogrel   . ALLERGIC RHINITIS 06/24/2009  . Anxiety   . Depression     Prior suicide attempt   Past Surgical History  Procedure Laterality Date  . Tonsillectomy    . Rotator cuff repair      bilateral  . S/p knee replacement  2008  . S/p bilat wrist surgury    . S/p urethral dilations,  multi 2009,2010  . Carpal tunnel release Bilateral   . S/p right knee arthroscopy  03/2005    and multi prior  . S/p umbilical hernia    .  S/p left knee arthroscopy  june 2002    multi prior  . S/p right shoulder rotater cuff surgury  april 2011    Dr. [name] Lighter  . Coronary angioplasty with stent placement      reports that he quit smoking about 2 years ago. His smoking use included Cigars. He has never used smokeless tobacco. He reports that he drinks alcohol. He reports that he does not use illicit drugs. family history includes Cancer in his father and mother; Coronary artery disease in his other; Depression in his brother, mother, and other; Diabetes in his father; Early death in his paternal grandfather; Early death (age of onset: 58) in his maternal grandfather; Heart disease in his father, maternal grandfather, and mother; Hyperlipidemia in his father; Hypertension in his brother, father, mother, and other. No Known Allergies Current Outpatient Prescriptions on File Prior to Visit  Medication Sig Dispense Refill  . amLODipine (NORVASC) 5 MG tablet Take 5 mg by mouth daily.      . ARIPiprazole (ABILIFY) 5 MG tablet Take 1 tablet (5 mg total) by mouth daily.  90 tablet  0  . aspirin 81 MG tablet Take 1 tablet (81 mg total) by mouth daily.  30 tablet  0  . atorvastatin (LIPITOR) 20 MG tablet Take 20 mg by mouth at bedtime.      [name]  benazepril (LOTENSIN) 20 MG tablet Take 20 mg by mouth at bedtime.      . clonazePAM (KLONOPIN) 2 MG tablet Take 1 tablet (2 mg total) by mouth 2 (two) times daily as needed for anxiety.  60 tablet  2  . divalproex (DEPAKOTE) 250 MG DR tablet TAKE 1 TABLET BY MOUTH EVERY MORNING AND 2 AT BEDTIME  270 tablet  0  . EFFIENT 10 MG TABS TAKE 1 TABLET BY MOUTH DAILY  30 tablet  3  . FLUoxetine (PROZAC) 40 MG capsule Take 1 capsule (40 mg total) by mouth daily.  90 capsule  0  . fluticasone (FLONASE) 50 MCG/ACT nasal spray PLACE 1 SPRAY INTO EACH NOSTRIL DAILY  16 g  11  . gabapentin (NEURONTIN) 400 MG capsule Take 800 mg by mouth 4 (four) times daily.      . indomethacin (INDOCIN) 25 MG capsule Take 50 mg by  mouth 2 (two) times daily with a meal.       . isosorbide mononitrate (IMDUR) 60 MG 24 hr tablet Take 90 mg by mouth daily.      . metFORMIN (GLUCOPHAGE) 500 MG tablet TAKE 1 TABLET BY MOUTH TWICE DAILY WITH MEALS  60 tablet  11  . oxyCODONE (ROXICODONE) 15 MG immediate release tablet Take 1 tablet (15 mg total) by mouth every 6 (six) hours as needed for pain. With limit 4 per day - to fill Jun 21, 2013  120 tablet  0  . tiZANidine (ZANAFLEX) 4 MG tablet Take 1 tablet (4 mg total) by mouth every 6 (six) hours as needed for muscle spasms.  60 tablet  1  . traZODone (DESYREL) 100 MG tablet Take 1 tablet (100 mg total) by mouth at bedtime.  90 tablet  0   No current facility-administered medications on file prior to visit.   Review of Systems  Constitutional: Negative for unexpected weight change, or unusual diaphoresis  HENT: Negative for tinnitus.   Eyes: Negative for photophobia and visual disturbance.  Respiratory: Negative for choking and stridor.   Gastrointestinal: Negative for vomiting and blood in stool.  Genitourinary: Negative for hematuria and decreased urine volume.  Musculoskeletal: Negative for acute joint swelling Skin: Negative for color change and wound.  Neurological: Negative for tremors and numbness other than noted  Psychiatric/Behavioral: Negative for decreased concentration or  hyperactivity.       Objective:   Physical Exam BP 122/62  Pulse 75  Temp(Src) 99.1 F (37.3 C) (Oral)  Ht 6' 1"  (1.854 m)  Wt 268 lb (121.564 kg)  BMI 35.37 kg/m2  SpO2 95% VS noted, .mild ill Constitutional: Pt appears well-developed and well-nourished.  HENT: Head: NCAT.  Right Ear: External ear normal.  Left Ear: External ear normal.  Bilat tm's with mild erythema.  Max sinus areas mild tender.  Pharynx with mild erythema, no exudate Eyes: Conjunctivae and EOM are normal. Pupils are equal, round, and reactive to light.  Neck: Normal range of motion. Neck supple.  Cardiovascular:  Normal rate and regular rhythm.   Pulmonary/Chest: Effort normal and breath sounds normal.  Neurological: Pt is alert. Not confused  Skin: Skin is warm. No erythema. skin lesion right cheek slightly oozing blood -  Cyst vs other  Psychiatric: Pt behavior is normal. Thought content normal.     Assessment & Plan:

## 2013-05-02 ENCOUNTER — Emergency Department (HOSPITAL_COMMUNITY)
Admission: EM | Admit: 2013-05-02 | Discharge: 2013-05-03 | Discharge status: Against Medical Advice | Payer: Medicare HMO | Attending: Emergency Medicine | Admitting: Emergency Medicine

## 2013-05-02 ENCOUNTER — Encounter (HOSPITAL_COMMUNITY): Payer: Self-pay | Admitting: Emergency Medicine

## 2013-05-02 DIAGNOSIS — I10 Essential (primary) hypertension: Secondary | ICD-10-CM | POA: Insufficient documentation

## 2013-05-02 DIAGNOSIS — G894 Chronic pain syndrome: Secondary | ICD-10-CM | POA: Insufficient documentation

## 2013-05-02 DIAGNOSIS — IMO0002 Reserved for concepts with insufficient information to code with codable children: Secondary | ICD-10-CM | POA: Insufficient documentation

## 2013-05-02 DIAGNOSIS — Z79899 Other long term (current) drug therapy: Secondary | ICD-10-CM | POA: Insufficient documentation

## 2013-05-02 DIAGNOSIS — Z87448 Personal history of other diseases of urinary system: Secondary | ICD-10-CM | POA: Insufficient documentation

## 2013-05-02 DIAGNOSIS — Z87442 Personal history of urinary calculi: Secondary | ICD-10-CM | POA: Insufficient documentation

## 2013-05-02 DIAGNOSIS — F329 Major depressive disorder, single episode, unspecified: Secondary | ICD-10-CM | POA: Insufficient documentation

## 2013-05-02 DIAGNOSIS — F3289 Other specified depressive episodes: Secondary | ICD-10-CM | POA: Insufficient documentation

## 2013-05-02 DIAGNOSIS — G4733 Obstructive sleep apnea (adult) (pediatric): Secondary | ICD-10-CM | POA: Insufficient documentation

## 2013-05-02 DIAGNOSIS — Z8709 Personal history of other diseases of the respiratory system: Secondary | ICD-10-CM | POA: Insufficient documentation

## 2013-05-02 DIAGNOSIS — R079 Chest pain, unspecified: Secondary | ICD-10-CM

## 2013-05-02 DIAGNOSIS — R072 Precordial pain: Secondary | ICD-10-CM | POA: Insufficient documentation

## 2013-05-02 DIAGNOSIS — F411 Generalized anxiety disorder: Secondary | ICD-10-CM | POA: Insufficient documentation

## 2013-05-02 DIAGNOSIS — R110 Nausea: Secondary | ICD-10-CM | POA: Insufficient documentation

## 2013-05-02 DIAGNOSIS — Z9861 Coronary angioplasty status: Secondary | ICD-10-CM | POA: Insufficient documentation

## 2013-05-02 DIAGNOSIS — Z7982 Long term (current) use of aspirin: Secondary | ICD-10-CM | POA: Insufficient documentation

## 2013-05-02 DIAGNOSIS — M51379 Other intervertebral disc degeneration, lumbosacral region without mention of lumbar back pain or lower extremity pain: Secondary | ICD-10-CM | POA: Insufficient documentation

## 2013-05-02 DIAGNOSIS — E782 Mixed hyperlipidemia: Secondary | ICD-10-CM | POA: Insufficient documentation

## 2013-05-02 DIAGNOSIS — M5137 Other intervertebral disc degeneration, lumbosacral region: Secondary | ICD-10-CM | POA: Insufficient documentation

## 2013-05-02 DIAGNOSIS — I2510 Atherosclerotic heart disease of native coronary artery without angina pectoris: Secondary | ICD-10-CM | POA: Insufficient documentation

## 2013-05-02 NOTE — ED Notes (Signed)
Per EMS, patient began having CP at 1430 today. Patient reported pain a 9/10 and described it as a stabbing pressure to the right sternum with radiation to right jaw. Patient has a hx of MI, but is unsure of the year. Patient does have stent placement. Patient received a total of 5 nitro and CP is now a 3/10. Initial BP on scene was 216/112, but is now 155/87. HR in mid 80's. Patient also reporting pain in right shoulder, but has had multiple rotator cuff surgeries and states that is normal for him.

## 2013-05-03 ENCOUNTER — Encounter: Payer: Self-pay | Admitting: Internal Medicine

## 2013-05-03 ENCOUNTER — Emergency Department (HOSPITAL_COMMUNITY): Payer: Medicare HMO

## 2013-05-03 LAB — URINALYSIS, ROUTINE W REFLEX MICROSCOPIC
Bilirubin Urine: NEGATIVE
Hgb urine dipstick: NEGATIVE
Ketones, ur: NEGATIVE mg/dL
Specific Gravity, Urine: 1.025 (ref 1.005–1.030)
pH: 6 (ref 5.0–8.0)

## 2013-05-03 LAB — COMPREHENSIVE METABOLIC PANEL
ALT: 10 U/L (ref 0–53)
Albumin: 3.2 g/dL — ABNORMAL LOW (ref 3.5–5.2)
Alkaline Phosphatase: 59 U/L (ref 39–117)
BUN: 20 mg/dL (ref 6–23)
Calcium: 8.3 mg/dL — ABNORMAL LOW (ref 8.4–10.5)
Chloride: 107 mEq/L (ref 96–112)
Creatinine, Ser: 0.82 mg/dL (ref 0.50–1.35)
Glucose, Bld: 130 mg/dL — ABNORMAL HIGH (ref 70–99)
Potassium: 3.6 mEq/L (ref 3.5–5.1)
Sodium: 142 mEq/L (ref 135–145)
Total Bilirubin: 0.2 mg/dL — ABNORMAL LOW (ref 0.3–1.2)
Total Protein: 6 g/dL (ref 6.0–8.3)

## 2013-05-03 LAB — URINE MICROSCOPIC-ADD ON

## 2013-05-03 LAB — CBC
HCT: 33.2 % — ABNORMAL LOW (ref 39.0–52.0)
Hemoglobin: 11.4 g/dL — ABNORMAL LOW (ref 13.0–17.0)
MCH: 31.6 pg (ref 26.0–34.0)
MCHC: 34.3 g/dL (ref 30.0–36.0)
MCV: 92 fL (ref 78.0–100.0)
Platelets: 199 10*3/uL (ref 150–400)
RDW: 13 % (ref 11.5–15.5)
WBC: 4.9 10*3/uL (ref 4.0–10.5)

## 2013-05-03 LAB — APTT: aPTT: 30 seconds (ref 24–37)

## 2013-05-03 LAB — PROTIME-INR: INR: 1.15 (ref 0.00–1.49)

## 2013-05-03 LAB — POCT I-STAT TROPONIN I: Troponin i, poc: 0.02 ng/mL (ref 0.00–0.08)

## 2013-05-03 LAB — PRO B NATRIURETIC PEPTIDE: Pro B Natriuretic peptide (BNP): 40.3 pg/mL (ref 0–125)

## 2013-05-03 LAB — MAGNESIUM: Magnesium: 1.9 mg/dL (ref 1.5–2.5)

## 2013-05-03 NOTE — ED Notes (Signed)
EKG done at 01:55 and given to Dr. Doy Mince

## 2013-05-03 NOTE — Assessment & Plan Note (Signed)
?   Cystic vs other - for derm referral

## 2013-05-03 NOTE — ED Notes (Signed)
The pt arrived by ems and he was given 4 sl nitro enroute.  He has no chest pain now.  He did have 4/10 on arrival here.  He is alert skin warm and dry no distress or pain at present.  Hx of mi and stents

## 2013-05-03 NOTE — ED Provider Notes (Signed)
CSN: [id number]     Arrival date & time 05/02/13  2348 History   First MD Initiated Contact with Patient 05/03/13 0037     Chief Complaint  Patient presents with  . Chest Pain   (Consider location/radiation/quality/duration/timing/severity/associated sxs/prior Treatment) HPI Comments: 62 yo wm presents via EMS for CP.    O: 2230 at home while watching TV P: Nitro improved sxs Q: ache R: R chest and L arm S: 8/10 --> 0/10 currently after ntg  NKDA PCP: [facility]: [facility]) Last cath in Sep 2014.  H/o 9 stents all clean in last cath per pt.  No smoking, EtOH, or IDU.    Pt was started on Abx (Zpack) for Strep Throat on Thursday.    EMS gave ASA x 4 and NTG x 3 per pt.   Patient is a 62 y.o. male presenting with chest pain. The history is provided by the patient and the EMS personnel.  Chest Pain Pain location:  R chest and substernal area Pain quality: aching   Pain radiates to:  L arm Pain radiates to the back: no   Pain severity:  Mild Duration:  2 hours Timing:  Intermittent Progression:  Resolved Chronicity:  Recurrent Relieved by:  Nitroglycerin Associated symptoms: nausea   Associated symptoms: no cough, no headache, no heartburn, no lower extremity edema, no palpitations and no shortness of breath     Past Medical History  Diagnosis Date  . Impaired glucose tolerance 09/27/2010  . HYPERLIPIDEMIA-MIXED 07/15/2009  . Chronic pain syndrome 10/27/2009  . HYPERTENSION 06/24/2009  . CORONARY ARTERY DISEASE 06/24/2009    a. s/p multiple PCIs - In 2008 he had a Taxus DES to the mild LAD, Endeavor DES to mid LCX and distal LCX. In January 2009 he had DES to distal LCX, mid LCX and proximal LCX. In November 2009 had BMS x 2 to the mid RCA. Cath 10/2011 with patent stents, noncardiac CP. LHC 01/2013: patent stents (noncardiac CP).  [name] URETHRAL STRICTURE 06/24/2009  . DEGENERATIVE JOINT DISEASE 06/24/2009  . SHOULDER PAIN, BILATERAL 06/24/2009  . ANKLE PAIN, RIGHT 06/24/2009   . [place] DISEASE, LUMBAR 04/19/2010  . SCIATICA, LEFT 04/19/2010  . NEPHROLITHIASIS, HX OF 06/24/2009  . ERECTILE DYSFUNCTION, ORGANIC 05/30/2010  . OSA (obstructive sleep apnea)     not using CPAP very often  . Obesity   . Allergy to clopidogrel   . ALLERGIC RHINITIS 06/24/2009  . Anxiety   . Depression     Prior suicide attempt   Past Surgical History  Procedure Laterality Date  . Tonsillectomy    . Rotator cuff repair      bilateral  . S/p knee replacement  2008  . S/p bilat wrist surgury    . S/p urethral dilations,  multi 2009,2010  . Carpal tunnel release Bilateral   . S/p right knee arthroscopy  03/2005    and multi prior  . S/p umbilical hernia    . S/p left knee arthroscopy  june 2002    multi prior  . S/p right shoulder rotater cuff surgury  april 2011    Dr. [name] Lighter  . Coronary angioplasty with stent placement     Family History  Problem Relation Age of Onset  . Depression Mother   . Heart disease Mother   . Hypertension Mother   . Cancer Mother     Breast  . Diabetes Father   . Heart disease Father     CABG  . Cancer Father  prostate and skin cancer  . Hypertension Father   . Hyperlipidemia Father   . Depression Brother     x 2  . Hypertension Brother     x2  . Coronary artery disease Other   . Hypertension Other   . Depression Other   . Heart disease Maternal Grandfather   . Early death Maternal Grandfather 53    heart attack  . Early death Paternal Grandfather    History  Substance Use Topics  . Smoking status: Former Smoker    Types: Cigars    Quit date: 08/28/2010  . Smokeless tobacco: Never Used     Comment: rare  . Alcohol Use: 0.0 oz/week     Comment: occasional (4 beers all summer)    Review of Systems  Constitutional: Negative.   HENT: Negative.   Eyes: Negative.   Respiratory: Negative for cough, choking, shortness of breath and stridor.   Cardiovascular: Positive for chest pain. Negative for palpitations and leg swelling.   Gastrointestinal: Positive for nausea. Negative for heartburn.  Endocrine: Negative.   Genitourinary: Negative.   Musculoskeletal: Negative.   Neurological: Negative for headaches.    Allergies  Review of patient's allergies indicates no known allergies.  Home Medications   Current Outpatient Rx  Name  Route  Sig  Dispense  Refill  . amLODipine (NORVASC) 5 MG tablet   Oral   Take 5 mg by mouth daily.         . ARIPiprazole (ABILIFY) 5 MG tablet   Oral   Take 1 tablet (5 mg total) by mouth daily.   90 tablet   0   . aspirin 81 MG tablet   Oral   Take 1 tablet (81 mg total) by mouth daily.   30 tablet   0   . atorvastatin (LIPITOR) 20 MG tablet   Oral   Take 20 mg by mouth at bedtime.         [name] azithromycin (ZITHROMAX Z-PAK) 250 MG tablet      Use as directed   6 each   1   . benazepril (LOTENSIN) 20 MG tablet   Oral   Take 20 mg by mouth at bedtime.         . clonazePAM (KLONOPIN) 2 MG tablet   Oral   Take 1 tablet (2 mg total) by mouth 2 (two) times daily as needed for anxiety.   60 tablet   2   . divalproex (DEPAKOTE) 250 MG DR tablet      TAKE 1 TABLET BY MOUTH EVERY MORNING AND 2 AT BEDTIME   270 tablet   0     90 days supply   . EFFIENT 10 MG TABS      TAKE 1 TABLET BY MOUTH DAILY   30 tablet   3   . FLUoxetine (PROZAC) 40 MG capsule   Oral   Take 1 capsule (40 mg total) by mouth daily.   90 capsule   0   . fluticasone (FLONASE) 50 MCG/ACT nasal spray      PLACE 1 SPRAY INTO EACH NOSTRIL DAILY   16 g   11   . gabapentin (NEURONTIN) 400 MG capsule   Oral   Take 800 mg by mouth 4 (four) times daily.         . indomethacin (INDOCIN) 25 MG capsule   Oral   Take 50 mg by mouth 2 (two) times daily with a meal.          .  isosorbide mononitrate (IMDUR) 60 MG 24 hr tablet   Oral   Take 90 mg by mouth daily.         . metFORMIN (GLUCOPHAGE) 500 MG tablet      TAKE 1 TABLET BY MOUTH TWICE DAILY WITH MEALS   60 tablet    11   . oxyCODONE (ROXICODONE) 15 MG immediate release tablet   Oral   Take 1 tablet (15 mg total) by mouth every 6 (six) hours as needed for pain. With limit 4 per day - to fill Jun 21, 2013   120 tablet   0   . tiZANidine (ZANAFLEX) 4 MG tablet   Oral   Take 1 tablet (4 mg total) by mouth every 6 (six) hours as needed for muscle spasms.   60 tablet   1   . traZODone (DESYREL) 100 MG tablet   Oral   Take 1 tablet (100 mg total) by mouth at bedtime.   90 tablet   0    BP 116/46  Pulse 58  Temp(Src) 98.6 F (37 C) (Oral)  Resp 14  SpO2 99% Physical Exam  Nursing note and vitals reviewed. Constitutional: He is oriented to person, place, and time. He appears well-developed and well-nourished. No distress.  HENT:  Head: Normocephalic and atraumatic.  Right Ear: External ear normal.  Left Ear: External ear normal.  Nose: Nose normal.  Mouth/Throat: Oropharynx is clear and moist. No oropharyngeal exudate.  Eyes: Conjunctivae are normal. Pupils are equal, round, and reactive to light. Right eye exhibits no discharge. Left eye exhibits no discharge.  Neck: Normal range of motion. Neck supple. No JVD present.  Cardiovascular: Normal rate and regular rhythm.  Exam reveals no friction rub.   No murmur heard. Pulmonary/Chest: Effort normal and breath sounds normal. No stridor. He has no wheezes. He has no rales.  Abdominal: Soft. Bowel sounds are normal. He exhibits no distension and no mass. There is no tenderness. There is no rebound and no guarding.  Musculoskeletal: He exhibits no edema and no tenderness.  Neurological: He is alert and oriented to person, place, and time. He has normal reflexes.  Skin: Skin is warm and dry. He is not diaphoretic.    ED Course  Procedures (including critical care time) Labs Review Labs Reviewed  CBC - Abnormal; Notable for the following:    RBC 3.61 (*)    Hemoglobin 11.4 (*)    HCT 33.2 (*)    All other components within normal limits   COMPREHENSIVE METABOLIC PANEL - Abnormal; Notable for the following:    Glucose, Bld 130 (*)    Calcium 8.3 (*)    Albumin 3.2 (*)    Total Bilirubin 0.2 (*)    All other components within normal limits  URINALYSIS, ROUTINE W REFLEX MICROSCOPIC - Abnormal; Notable for the following:    Leukocytes, UA TRACE (*)    All other components within normal limits  URINE MICROSCOPIC-ADD ON - Abnormal; Notable for the following:    Bacteria, UA FEW (*)    All other components within normal limits  URINE CULTURE  MAGNESIUM  PROTIME-INR  APTT  PRO B NATRIURETIC PEPTIDE  POCT I-STAT TROPONIN I   Imaging Review Dg Chest 2 View  05/03/2013   CLINICAL DATA:  Chest pain.  Nonsmoker.  EXAM: CHEST  2 VIEW  COMPARISON:  02/07/13.  FINDINGS: Normal heart size with clear lung fields. No bony abnormality. No change from prior recent examination.  IMPRESSION: No active cardiopulmonary  disease.   Electronically Signed   By: Rolla Flatten M.D.   On: 05/03/2013 01:16    EKG Interpretation    Date/Time:  Saturday May 03 2013 00:01:55 EST Ventricular Rate:  71 PR Interval:  158 QRS Duration: 126 QT Interval:  402 QTC Calculation: 437 R Axis:   19 Text Interpretation:  Age not entered, assumed to be  62 years old for purpose of ECG interpretation Sinus rhythm Nonspecific intraventricular conduction delay Confirmed by [facility]  MD,  Grosser ([id number]) on 05/03/2013 12:44:50 AM           Results for orders placed during the hospital encounter of 05/02/13  CBC      Result Value Range   WBC 4.9  4.0 - 10.5 K/uL   RBC 3.61 (*) 4.22 - 5.81 MIL/uL   Hemoglobin 11.4 (*) 13.0 - 17.0 g/dL   HCT 33.2 (*) 39.0 - 52.0 %   MCV 92.0  78.0 - 100.0 fL   MCH 31.6  26.0 - 34.0 pg   MCHC 34.3  30.0 - 36.0 g/dL   RDW 13.0  11.5 - 15.5 %   Platelets 199  150 - 400 K/uL  COMPREHENSIVE METABOLIC PANEL      Result Value Range   Sodium 142  135 - 145 mEq/L   Potassium 3.6  3.5 - 5.1 mEq/L   Chloride 107  96 - 112  mEq/L   CO2 28  19 - 32 mEq/L   Glucose, Bld 130 (*) 70 - 99 mg/dL   BUN 20  6 - 23 mg/dL   Creatinine, Ser 0.82  0.50 - 1.35 mg/dL   Calcium 8.3 (*) 8.4 - 10.5 mg/dL   Total Protein 6.0  6.0 - 8.3 g/dL   Albumin 3.2 (*) 3.5 - 5.2 g/dL   AST 13  0 - 37 U/L   ALT 10  0 - 53 U/L   Alkaline Phosphatase 59  39 - 117 U/L   Total Bilirubin 0.2 (*) 0.3 - 1.2 mg/dL   GFR calc non Af Amer >90  >90 mL/min   GFR calc Af Amer >90  >90 mL/min  MAGNESIUM      Result Value Range   Magnesium 1.9  1.5 - 2.5 mg/dL  URINALYSIS, ROUTINE W REFLEX MICROSCOPIC      Result Value Range   Color, Urine YELLOW  YELLOW   APPearance CLEAR  CLEAR   Specific Gravity, Urine 1.025  1.005 - 1.030   pH 6.0  5.0 - 8.0   Glucose, UA NEGATIVE  NEGATIVE mg/dL   Hgb urine dipstick NEGATIVE  NEGATIVE   Bilirubin Urine NEGATIVE  NEGATIVE   Ketones, ur NEGATIVE  NEGATIVE mg/dL   Protein, ur NEGATIVE  NEGATIVE mg/dL   Urobilinogen, UA 1.0  0.0 - 1.0 mg/dL   Nitrite NEGATIVE  NEGATIVE   Leukocytes, UA TRACE (*) NEGATIVE  PROTIME-INR      Result Value Range   Prothrombin Time 14.5  11.6 - 15.2 seconds   INR 1.15  0.00 - 1.49  APTT      Result Value Range   aPTT 30  24 - 37 seconds  PRO B NATRIURETIC PEPTIDE      Result Value Range   Pro B Natriuretic peptide (BNP) 40.3  0 - 125 pg/mL  URINE MICROSCOPIC-ADD ON      Result Value Range   Squamous Epithelial / LPF RARE  RARE   WBC, UA 3-6  <3 WBC/hpf   RBC / HPF  3-6  <3 RBC/hpf   Bacteria, UA FEW (*) RARE  POCT I-STAT TROPONIN I      Result Value Range   Troponin i, poc 0.02  0.00 - 0.08 ng/mL   Comment 3             MDM   1. Chest pain    62 yo male with MMP presents to ER for CP.  VSS.  EKG and Trop negative.  CXR neg. Concern for pt due to cc of CP and PMH, however pt requested to leave AMA when his CP resolved.  He was alert and oriented and in NAD.  He was w/o sns of delirium, dementia, or intoxication. Pt left ER AMA - witnessed by nursing. Unclear  etiology of symptoms at this point.  Pt is aware he may return at any time.   He may have undiagnosed ACS, AD, PE, or other life threatening condition. ER precautions were given.       Elmer Sow, MD 05/03/13 [phone]

## 2013-05-03 NOTE — Assessment & Plan Note (Signed)
Mild to mod, for antibx course,  to f/u any worsening symptoms or concerns 

## 2013-05-03 NOTE — Assessment & Plan Note (Signed)
With ongoing hypersomnolence - for pulm referral for further eval and tx

## 2013-05-03 NOTE — ED Notes (Signed)
The pt reports that his chest pain is gone and he does not wish for any other tests etc.  He signed ama form witnessed by myself and left.  He knows he can return if he has amy problems

## 2013-05-04 LAB — URINE CULTURE: Colony Count: >=100000

## 2013-05-05 ENCOUNTER — Other Ambulatory Visit: Payer: Self-pay | Admitting: Cardiology

## 2013-05-05 NOTE — ED Notes (Signed)
Left AMA when CP resolved.Call patient to see if he has any urinary symptoms.If so  Amoxicillin 250 mg po TID per Montine Circle.

## 2013-05-08 ENCOUNTER — Telehealth (HOSPITAL_COMMUNITY): Payer: Self-pay | Admitting: *Deleted

## 2013-05-08 ENCOUNTER — Other Ambulatory Visit: Payer: Self-pay | Admitting: Neurology

## 2013-05-08 ENCOUNTER — Telehealth: Payer: Self-pay | Admitting: Neurology

## 2013-05-08 NOTE — Telephone Encounter (Signed)
Left a message for the patient to return my call.  

## 2013-05-08 NOTE — Telephone Encounter (Signed)
Patient called to say his scan was scheduled at [facility] on 06/03/13.  **Dr. Carles Collet, please advise the appropriate follow up appointment date with you in order to discuss the results. Thanks.

## 2013-05-08 NOTE — Telephone Encounter (Signed)
Spoke with the patient. I let him know that all information was faxed to [facility] for the DaT scan and that they would call him to schedule the appointment. I then asked him to please let us know the appointment date for the scan so that we could then schedule a follow up appointment for him with Dr. Carles Collet. He states he will let us know.

## 2013-05-08 NOTE — ED Notes (Signed)
Unable to contact via phone letter sent to [facility] address.

## 2013-05-08 NOTE — Telephone Encounter (Signed)
Message copied by Angelica Pou on Thu May 08, 2013  8:44 AM ------      Message from: TAT, Pike: Wed May 07, 2013  3:11 PM       Jan,            Pt was supposed to have a DaT.  We originally thought it could be done in [place] but I just got called that they can't do it.  Please reschedule through [facility] and reschedule his f/u appt until after it is completed.            Thank you! ------

## 2013-05-09 NOTE — Telephone Encounter (Signed)
Beginning of feb

## 2013-05-09 NOTE — Telephone Encounter (Signed)
Called and spoke with the patient and appointment rescheduled to 06/25/13 at 10:45 to arrive at 10:30 am.

## 2013-05-19 ENCOUNTER — Ambulatory Visit: Payer: Medicare HMO | Admitting: Neurology

## 2013-05-19 ENCOUNTER — Other Ambulatory Visit: Payer: Self-pay | Admitting: Internal Medicine

## 2013-05-19 ENCOUNTER — Telehealth (HOSPITAL_COMMUNITY): Payer: Self-pay | Admitting: *Deleted

## 2013-05-19 DIAGNOSIS — F39 Unspecified mood [affective] disorder: Secondary | ICD-10-CM

## 2013-05-19 MED ORDER — ARIPIPRAZOLE 5 MG PO TABS: 5.0000 mg | ORAL_TABLET | Freq: Every day | ORAL | Status: DC

## 2013-05-19 NOTE — Telephone Encounter (Signed)
Pt left IH:WTUUEKC sent to [facility].He owes them money and can't pay right now.They won't send med without payment first.Do we have any samples?Has been off medicine a few days and symptoms returning.  Advised pt no samples availble per [facility] pharmacist.Pt asked that RX sent to [facility] be d/c'd and Rx sent to [facility] on [address] instead.

## 2013-05-20 ENCOUNTER — Other Ambulatory Visit: Payer: Self-pay

## 2013-05-20 NOTE — Telephone Encounter (Signed)
Patient called to inform did not want to use [facility] as a pharmacy, only [facility].  Updated pharmacy.

## 2013-05-21 ENCOUNTER — Telehealth (HOSPITAL_COMMUNITY): Payer: Self-pay | Admitting: *Deleted

## 2013-05-21 NOTE — Telephone Encounter (Signed)
Pt left NZ:DKEUVHAW started on Abilify.Given samples.RXthen sent to [facility] because he owed [facility] money.At [facility] for Abilify is $150.Do we have coupons or anything to reduce cost?  Left message for pt: No coupons here per [place].Pharmacist recommended for pt to go to [email or web address] to see if any savings program available

## 2013-05-25 ENCOUNTER — Other Ambulatory Visit (HOSPITAL_COMMUNITY): Payer: Self-pay | Admitting: Psychiatry

## 2013-05-26 NOTE — Telephone Encounter (Signed)
Chart reviewed, refill not appropriate at this time. 3 month supply given Nov 2014.

## 2013-05-28 NOTE — Telephone Encounter (Signed)
We do not have any samples of Abilify.

## 2013-06-05 ENCOUNTER — Ambulatory Visit (INDEPENDENT_AMBULATORY_CARE_PROVIDER_SITE_OTHER): Payer: Commercial Managed Care - HMO | Admitting: Psychology

## 2013-06-05 DIAGNOSIS — F331 Major depressive disorder, recurrent, moderate: Secondary | ICD-10-CM

## 2013-06-10 ENCOUNTER — Ambulatory Visit (INDEPENDENT_AMBULATORY_CARE_PROVIDER_SITE_OTHER): Payer: Medicare HMO | Admitting: Family Medicine

## 2013-06-10 ENCOUNTER — Telehealth: Payer: Self-pay | Admitting: Neurology

## 2013-06-10 ENCOUNTER — Encounter: Payer: Self-pay | Admitting: Family Medicine

## 2013-06-10 ENCOUNTER — Ambulatory Visit (INDEPENDENT_AMBULATORY_CARE_PROVIDER_SITE_OTHER): Payer: Medicare HMO | Admitting: Internal Medicine

## 2013-06-10 ENCOUNTER — Encounter: Payer: Self-pay | Admitting: Internal Medicine

## 2013-06-10 VITALS — BP 124/80 | HR 59 | Ht 73.0 in | Wt 277.0 lb

## 2013-06-10 VITALS — BP 132/78 | HR 72 | Temp 96.9°F | Resp 16 | Wt 277.0 lb

## 2013-06-10 DIAGNOSIS — J069 Acute upper respiratory infection, unspecified: Secondary | ICD-10-CM

## 2013-06-10 DIAGNOSIS — M25559 Pain in unspecified hip: Secondary | ICD-10-CM

## 2013-06-10 DIAGNOSIS — M75102 Unspecified rotator cuff tear or rupture of left shoulder, not specified as traumatic: Secondary | ICD-10-CM

## 2013-06-10 DIAGNOSIS — M25549 Pain in joints of unspecified hand: Secondary | ICD-10-CM | POA: Insufficient documentation

## 2013-06-10 DIAGNOSIS — M25552 Pain in left hip: Secondary | ICD-10-CM

## 2013-06-10 DIAGNOSIS — M12811 Other specific arthropathies, not elsewhere classified, right shoulder: Secondary | ICD-10-CM

## 2013-06-10 DIAGNOSIS — M75101 Unspecified rotator cuff tear or rupture of right shoulder, not specified as traumatic: Secondary | ICD-10-CM | POA: Insufficient documentation

## 2013-06-10 DIAGNOSIS — G4733 Obstructive sleep apnea (adult) (pediatric): Secondary | ICD-10-CM

## 2013-06-10 DIAGNOSIS — M12812 Other specific arthropathies, not elsewhere classified, left shoulder: Secondary | ICD-10-CM | POA: Insufficient documentation

## 2013-06-10 DIAGNOSIS — M19019 Primary osteoarthritis, unspecified shoulder: Secondary | ICD-10-CM

## 2013-06-10 HISTORY — DX: Pain in joints of unspecified hand: M25.549

## 2013-06-10 HISTORY — DX: Unspecified rotator cuff tear or rupture of right shoulder, not specified as traumatic: M75.101

## 2013-06-10 HISTORY — DX: Other specific arthropathies, not elsewhere classified, right shoulder: M12.811

## 2013-06-10 HISTORY — DX: Other specific arthropathies, not elsewhere classified, left shoulder: M12.812

## 2013-06-10 MED ORDER — BENZONATATE 100 MG PO CAPS: ORAL_CAPSULE | ORAL | Status: DC

## 2013-06-10 NOTE — Assessment & Plan Note (Signed)
Patient does have bilateral hand pain is likely is multifactorial. Patient and did not have any significant osteoarthritic changes in likely does have some neuropathy. It is always is occurring in early morning I would like to get a sleep study. This has been ordered. In addition this patient will start a multivitamin encases low calcium as well as vitamin D and we discussed an iron pill. Patient like to hold on any labs at this time. Discuss potentially sleeping with gloves on it is secondary to cold and arthritis. No imaging today. Patient will try these interventions and an come back again in 3-4 weeks for further evaluation.

## 2013-06-10 NOTE — Progress Notes (Signed)
Subjective:    Patient ID: Travis [name], male    DOB: [birth year], 63 y.o.   MRN: [id number]  HPI 06/10/13- 63 yoM former smoker referred courtesy of Dr Jenny Reichmann for OSA.  Remote sleep study in [place] was positive. He lost CPAP in a move. Now admits daytime sleepiness, non-restorative sleep with difficulty initiating sleep, loud snoring. Admits he was better off with CPAP.  Hx septoplasty and sinus surgery. CAD/ MI, HBP, DM, osteoarthritis Bedtime 10-11 PM, latency 2 hours, waking 2-3 times before up 7:30 AM.  Has lost 150 lbs in 2 years. Discomfort from osteoarthritis with associated surgeries.  Prior to Admission medications   Medication Sig Start Date End Date Taking? Authorizing Provider  amLODipine (NORVASC) 5 MG tablet Take 5 mg by mouth daily.   Yes Historical Provider, MD  ARIPiprazole (ABILIFY) 5 MG tablet Take 1 tablet (5 mg total) by mouth daily. 05/19/13  Yes Kathlee Nations, MD  aspirin 81 MG tablet Take 1 tablet (81 mg total) by mouth daily. 06/28/12  Yes Waylan Boga, NP  atorvastatin (LIPITOR) 20 MG tablet Take 20 mg by mouth at bedtime.   Yes Historical Provider, MD  benazepril (LOTENSIN) 20 MG tablet TAKE 1 TABLET BY MOUTH EVERY DAY 05/19/13  Yes [name], MD  clonazePAM (KLONOPIN) 2 MG tablet Take 1 tablet (2 mg total) by mouth 2 (two) times daily as needed for anxiety. 04/08/13  Yes [name], MD  divalproex (DEPAKOTE) 250 MG DR tablet TAKE 1 TABLET BY MOUTH EVERY MORNING AND 2 AT BEDTIME 04/12/13  Yes Kathlee Nations, MD  EFFIENT 10 MG TABS tablet TAKE 1 TABLET BY MOUTH DAILY 05/05/13  Yes Larey Dresser, MD  FLUoxetine (PROZAC) 40 MG capsule Take 1 capsule (40 mg total) by mouth daily. 04/30/13  Yes [name], MD  fluticasone [facility]) 50 MCG/ACT nasal spray PLACE 1 SPRAY INTO EACH NOSTRIL DAILY 03/11/13  Yes [name], MD  gabapentin (NEURONTIN) 400 MG capsule Take 800 mg by mouth 4 (four) times daily. 06/28/12  Yes Waylan Boga, NP  indomethacin (INDOCIN) 25 MG  capsule Take 50 mg by mouth 2 (two) times daily with a meal.  03/12/13  Yes Historical Provider, MD  isosorbide mononitrate (IMDUR) 60 MG 24 hr tablet Take 90 mg by mouth daily.   Yes Historical Provider, MD  metFORMIN (GLUCOPHAGE) 500 MG tablet TAKE 1 TABLET BY MOUTH TWICE DAILY WITH MEALS 04/24/13  Yes [name], MD  oxyCODONE (ROXICODONE) 15 MG immediate release tablet Take 1 tablet (15 mg total) by mouth every 6 (six) hours as needed for pain. With limit 4 per day - to fill Jun 21, 2013 04/16/13  Yes [name], MD  tiZANidine (ZANAFLEX) 4 MG tablet Take 1 tablet (4 mg total) by mouth every 6 (six) hours as needed for muscle spasms. 04/03/13  Yes [name], MD  traZODone (DESYREL) 100 MG tablet Take 1 tablet (100 mg total) by mouth at bedtime. 04/30/13  Yes Kathlee Nations, MD  benzonatate (TESSALON PERLES) 100 MG capsule 1-2 tabs by mouth three times per day as needed for cough 06/10/13   Deneise Lever, MD   Past Medical History  Diagnosis Date  . Impaired glucose tolerance 09/27/2010  . HYPERLIPIDEMIA-MIXED 07/15/2009  . Chronic pain syndrome 10/27/2009  . HYPERTENSION 06/24/2009  . CORONARY ARTERY DISEASE 06/24/2009    a. s/p multiple PCIs - In 2008 he had a Taxus DES to the mild LAD,  Endeavor DES to mid LCX and distal LCX. In January 2009 he had DES to distal LCX, mid LCX and proximal LCX. In November 2009 had BMS x 2 to the mid RCA. Cath 10/2011 with patent stents, noncardiac CP. LHC 01/2013: patent stents (noncardiac CP).  [name] URETHRAL STRICTURE 06/24/2009  . DEGENERATIVE JOINT DISEASE 06/24/2009  . SHOULDER PAIN, BILATERAL 06/24/2009  . ANKLE PAIN, RIGHT 06/24/2009  . [place] DISEASE, LUMBAR 04/19/2010  . SCIATICA, LEFT 04/19/2010  . NEPHROLITHIASIS, HX OF 06/24/2009  . ERECTILE DYSFUNCTION, ORGANIC 05/30/2010  . OSA (obstructive sleep apnea)     not using CPAP very often  . Obesity   . Allergy to clopidogrel   . ALLERGIC RHINITIS 06/24/2009  . Anxiety   . Depression     Prior suicide attempt    Past Surgical History  Procedure Laterality Date  . Tonsillectomy    . Rotator cuff repair      bilateral  . S/p knee replacement  2008  . S/p bilat wrist surgury    . S/p urethral dilations,  multi 2009,2010  . Carpal tunnel release Bilateral   . S/p right knee arthroscopy  03/2005    and multi prior  . S/p umbilical hernia    . S/p left knee arthroscopy  june 2002    multi prior  . S/p right shoulder rotater cuff surgury  april 2011    Dr. [name] Lighter  . Coronary angioplasty with stent placement     Family History  Problem Relation Age of Onset  . Depression Mother   . Heart disease Mother   . Hypertension Mother   . Cancer Mother     Breast  . Diabetes Father   . Heart disease Father     CABG  . Cancer Father     prostate and skin cancer  . Hypertension Father   . Hyperlipidemia Father   . Depression Brother     x 2  . Hypertension Brother     x2  . Coronary artery disease Other   . Hypertension Other   . Depression Other   . Heart disease Maternal Grandfather   . Early death Maternal Grandfather 19    heart attack  . Early death Paternal Grandfather    History   Social History  . Marital Status: Divorced    Spouse Name: N/A    Number of Children: 2  . Years of Education: N/A   Occupational History  . disabled since 2006 due to ortho. heart, psych   . part time work as an Multimedia programmer, wrestling, and baseball Agricultural consultant    Social History Main Topics  . Smoking status: Former Smoker    Types: Cigars    Quit date: 08/28/2010  . Smokeless tobacco: Never Used     Comment: rare  . Alcohol Use: 0.0 oz/week     Comment: occasional (4 beers all summer)  . Drug Use: No  . Sexual Activity: Not Currently   Other Topics Concern  . Not on file   Social History Narrative   Played semi-pro football, used steroids   Divorced moved here from [place], [place]      03/05/2013 AHW "[name]" was born and grew up in [place], [place], and moved to  [place], [place] at age 72. He has one sister and 3 brothers. He reports that his childhood was rough. His parents are deceased. He graduated from [employer], and attended one year of college. He is currently unemployed  and on disability for multiple medical problems. He has been married 5 times. He has 2 sons. He currently lives alone. 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 He denies that he has any social support system. 03/05/2013 AHW             Review of Systems  Constitutional: Negative for fever, chills, diaphoresis, activity change, appetite change, fatigue and unexpected weight change.  HENT: Positive for congestion, dental problem, sore throat and trouble swallowing. Negative for ear discharge, ear pain, facial swelling, hearing loss, mouth sores, nosebleeds, postnasal drip, rhinorrhea, sinus pressure, sneezing, tinnitus and voice change.   Eyes: Negative for photophobia, discharge, itching and visual disturbance.  Respiratory: Positive for cough and shortness of breath. Negative for apnea, choking, chest tightness, wheezing and stridor.   Cardiovascular: Positive for chest pain and palpitations. Negative for leg swelling.  Gastrointestinal: Negative for nausea, vomiting, abdominal pain, constipation, blood in stool and abdominal distention.  Genitourinary: Negative for dysuria, urgency, frequency, hematuria, flank pain, decreased urine volume and difficulty urinating.  Musculoskeletal: Negative for arthralgias, back pain, gait problem, joint swelling, myalgias, neck pain and neck stiffness.  Skin: Negative for color change, pallor and rash.  Neurological: Positive for headaches. Negative for dizziness, tremors, seizures, syncope, speech difficulty, weakness, light-headedness and numbness.  Hematological: Negative for adenopathy. Does not bruise/bleed easily.  Psychiatric/Behavioral: Positive for dysphoric mood. Negative for confusion,  sleep disturbance and agitation. The patient is nervous/anxious.        Objective:   Physical Exam  OBJ- Physical Exam General- Alert, Oriented, Affect-appropriate, Distress- none acute.[name] Overweight Skin- rash-none, lesions- none, excoriation- none Lymphadenopathy- none Head- atraumatic            Eyes- Gross vision intact, PERRLA, conjunctivae and secretions clear            Ears- Hearing, canals-normal            Nose- Clear, no-Septal dev, mucus, polyps, erosion, perforation             Throat- Mallampati II-III , mucosa clear , drainage- none, tonsils- atrophic. Missing tooth Neck- flexible , trachea midline, no stridor , thyroid nl, carotid no bruit Chest - symmetrical excursion , unlabored           Heart/CV- RRR , no murmur , no gallop  , no rub, nl s1 s2                           - JVD- none , edema- none, stasis changes- none, varices- none           Lung- clear to P&A, wheeze- none, cough- none , dullness-none, rub- none           Chest wall-  Abd- tender-no, distended-no, bowel sounds-present, HSM- no Br/ Gen/ Rectal- Not done, not indicated Extrem-+brace R ankle, Surgical scar R shoulder, difficulty lifting R arm Neuro- grossly intact to observation     Assessment & Plan:

## 2013-06-10 NOTE — Assessment & Plan Note (Signed)
Spent greater than 25 minutes with patient face-to-face and had greater than 50% of counseling including as described above in assessment and plan. Discussed with patient again at length. Patient is having severe pain of that left hip and I do think he could respond well to an intra-articular injection. Today we did run out of time and I would like him to come back for further evaluation as well as likely injection. Patient given a list of over-the-counter medications that could be beneficial for osteoarthritis pain.

## 2013-06-10 NOTE — Progress Notes (Signed)
Pre-visit discussion using our clinic review tool. No additional management support is needed unless otherwise documented below in the visit note.  

## 2013-06-10 NOTE — Assessment & Plan Note (Signed)
He describes recurrent URIs/ bronchitis, former cigar smoker, and asks refill tessalon. Plan script benzonatate perles

## 2013-06-10 NOTE — Patient Instructions (Signed)
Order- Schedule split protocol NPSG  Dx OSA  Script sent for benzonatate perles for cough if needed

## 2013-06-10 NOTE — Telephone Encounter (Signed)
Spoke with patient and discussed results. He states he is borderline diabetic. Also, he does not have a pain management MD because he had moved to [facility] and since he has been back he has not been referred. I encouraged him to discuss these issues with his PCP to further investigate possible diabetes and to get a new referral to pain management. He will keep follow up appt.

## 2013-06-10 NOTE — Patient Instructions (Signed)
Good to see you again Dr. Frederik Pear MD at [facility] for your knee.  Dr. Tamera Punt M.D. For your shoulder. [phone] Take tylenol 650 mg three times a day is the best evidence based medicine we have for arthritis.  Glucosamine sulfate 725m twice a day is a supplement that has been shown to help moderate to severe arthritis. Vitamin D 2000 IU daily Fish oil 2 grams daily.  Tumeric 5056mtwice daily.  Iron 3258maily Multi vitamin aily can help as well.  Hands can wear gloves at night as well.  Capsaicin topically up to four times a day may also help with pain. Lets make an appoitnement in 1-2 weeks for injection in your hip  Shoe inserts with good arch support may be helpful.  [employer] orthotics at [employer] could help.  Water aerobics and cycling with low resistance are the best two types of exercise for arthritis.

## 2013-06-10 NOTE — Progress Notes (Signed)
I'm seeing this patient by the request  of:  [name], MD   CC:leg and hand pain bilaterally.   HPI: Patient is a 62 year old gentleman with a past medical history significant for chronic pain syndrome, degenerative joint disease, lumbar disc disease, bilateral rotator cuff repair with failure on right and knee replacement coming in with complaints of bilateral hand and leg pain.   Patient does notice that he's had increasing pain and stiffness of the hands bilaterally more in the early mornings. Patient states once he gets moving it seems to improve somewhat. Patient denies any radiation from the neck or arms. Patient does have some peripheral neuropathy but states the 800 mg of gabapentin 4 times a day has not seem to be any more helpful. Patient is concerned because he does like to do other activities with his hands. Patient rates his pain approximately 6/10 in severity. Patient has not tried any other over-the-counter medications at this time.  Patient continues to have his left hip pain. He has had it for multiple years. Patient feels he is compensating for his right knee and likely has made the pain worse. Patient is looking for physician to do a right knee revision.   Regarding his back pain pretty much very similar to as stated above. MRI 2012 of the lumbar spine showed moderate spinal stenosis as well as multiple level degenerative disc disease. Patient has undergone epidural injections before which do give him some relief. Continues to have the same pain. Patient was given a home exercise program the last time I saw him 3 months ago he never seem to improve. Patient actually never did the exercises he states. Patient like to avoid any formal physical therapy   Past medical, surgical, family and social history reviewed. Medications reviewed all in the electronic medical record.   Review of Systems: No headache, visual changes, nausea, vomiting, diarrhea, constipation, dizziness,  abdominal pain, skin rash, fevers, chills, night sweats, weight loss, swollen lymph nodes, body aches, joint swelling, muscle aches, chest pain, shortness of breath, mood changes.   Objective:    Blood pressure 132/78, pulse 72, temperature 96.9 F (36.1 C), temperature source Oral, resp. rate 16, weight 277 lb (125.646 kg), SpO2 96.00%.   General: No apparent distress alert and oriented x3 mood and affect normal, dressed appropriately. Patient is overweight HEENT: Pupils equal, extraocular movements intact Respiratory: Patient's speak in full sentences and does not appear short of breath Cardiovascular: No lower extremity edema, non tender, no erythema Skin: Warm dry intact with no signs of infection or rash on extremities or on axial skeleton. Abdomen: Soft nontender Neuro: Cranial nerves II through XII are intact, neurovascularly intact in all extremities  Lymph: No lymphadenopathy of posterior or anterior cervical chain or axillae bilaterally.  Gait shows patient does have extreme external rotation of the right leg secondary to knee replacement as well as foot drop. Patient is wearing a brace on his right ankle. Patient has a significant antalgic gait as well as a flexed lumbar spine negative very difficult to ambulate correctly.  MSK:  Neck: Inspection unremarkable. No palpable stepoffs. Negative Spurling's maneuver. Mild restriction of range of motion in all planes Grip strength and sensation normal in bilateral hands Strength good C4 to T1 distribution No sensory change to C4 to T1 Negative Hoffman sign bilaterally Reflexes normal Back Exam:  Inspection: Unremarkable  Motion: Flexion 25 deg, Extension 25 deg, Side Bending to 35 deg bilaterally,  Rotation to 45 deg bilaterally  SLR  laying: Positive left XSLR laying: Negative  Palpable tenderness: Diffuse of the paraspinal musculature of the thoracic and lumbar spine bilaterally FABER: Positive for difficult to assess secondary  to patient having knee replacement bilaterally. Sensory change: Gross sensation intact to all lumbar and sacral dermatomes.  Reflexes: 2+ at both patellar tendons, 2+ at achilles tendons, Babinski's downgoing.  Strength at foot  Plantar-flexion: 3/5 on the right with 4/5 on the Dorsi-flexion: 3/5 on right and 4-5 on left Eversion: 5/5 Inversion: 5/5  Leg strength  Gait unremarkable.  Hip: Left ROM IR: 15 Deg, ER: 45 Deg, Flexion: 120 Deg, Extension: 100 Deg, Abduction: 45 Deg, Adduction: 45 Deg Strength IR: 4/5, ER: 4/5, Flexion: 5/5, Extension: 5/5, Abduction: 3/5, Adduction: 4/5 Pelvic alignment unremarkable to inspection and palpation. Standing hip rotation and gait without trendelenburg sign / unsteadiness. Greater trochanter with mild tenderness to palpation. No tenderness over piriformis  Positive pain with FABER in severe pain with FADIR. No SI joint tenderness and normal minimal SI movement.  Pain exam bilaterally shows full range of motion good grip strength and neurovascularly intact distally with good capillary refill. Shoulder: Right Inspection reveals no abnormalities, atrophy or asymmetry. Patient does have generalized tenderness to palpation over the right shoulder Patient has severe lack of strength of the rotator cuff likely full tear and significant crepitus positive painful arc and positive drop arm sign occurring.      Impression and Recommendations:     This case required medical decision making of moderate complexity.

## 2013-06-10 NOTE — Assessment & Plan Note (Signed)
Strong probability that he has significant obstructive apnea. Discussed sleep hygiene and outlined test. Plan- schedule split protocol NPSG

## 2013-06-10 NOTE — Assessment & Plan Note (Signed)
With patient having recurrent tears and the amount of osteoarthritis that it is in the shoulder only revision would be possible. Patient will go to Dr. Tamera Punt for further evaluation and treatment options.

## 2013-06-10 NOTE — Telephone Encounter (Signed)
Jade, would you please let the pt know the following:  1.  I got his DaT scan from [facility].  It was normal.  No evidence of any type of parkinsons disease or any related condition  2.  His EMG done by Dr. Posey Pronto showed a peripheral neuropathy (just damage to long nerves that go to feet...most common causes in our [place] are DM and EtOH) and a bilateral chronic pinched L5 nerves, mild (he is already seeing pain management about this)

## 2013-06-10 NOTE — Progress Notes (Signed)
06/10/13- 47 yoM former smoker referred courtesy of Dr Jenny Reichmann for OSA. Remote sleep study in [place] was positive. He lost CPAP in a move. Now admits daytime sleepiness, non-restorative sleep with difficulty initiating sleep, loud snoring. Admits he was better off with CPAP. Hx septoplasty and sinus surgery. CAD/ MI, HBP, DM, osteoarthritis

## 2013-06-18 ENCOUNTER — Telehealth (HOSPITAL_COMMUNITY): Payer: Self-pay | Admitting: *Deleted

## 2013-06-18 NOTE — Telephone Encounter (Signed)
Pt left ZJ:UDILO a refill of Abilify and cannot afford it.Can he get samples?

## 2013-06-19 ENCOUNTER — Ambulatory Visit (INDEPENDENT_AMBULATORY_CARE_PROVIDER_SITE_OTHER): Payer: Commercial Managed Care - HMO | Admitting: Psychology

## 2013-06-19 ENCOUNTER — Telehealth (HOSPITAL_COMMUNITY): Payer: Self-pay | Admitting: *Deleted

## 2013-06-19 DIAGNOSIS — F331 Major depressive disorder, recurrent, moderate: Secondary | ICD-10-CM

## 2013-06-19 NOTE — Telephone Encounter (Signed)
Informed pt that there are samples of Abilify availbe for him to pick up.The samples are 10 mg, he will need to cut in half for 5 mg dose.Pt verbalized understanding

## 2013-06-24 ENCOUNTER — Encounter: Payer: Self-pay | Admitting: Family Medicine

## 2013-06-24 ENCOUNTER — Other Ambulatory Visit (INDEPENDENT_AMBULATORY_CARE_PROVIDER_SITE_OTHER): Payer: Medicare HMO

## 2013-06-24 ENCOUNTER — Telehealth: Payer: Self-pay

## 2013-06-24 ENCOUNTER — Ambulatory Visit (INDEPENDENT_AMBULATORY_CARE_PROVIDER_SITE_OTHER): Payer: Medicare HMO | Admitting: Family Medicine

## 2013-06-24 VITALS — BP 138/74 | HR 83 | Temp 97.9°F | Resp 18 | Wt 270.6 lb

## 2013-06-24 DIAGNOSIS — M25569 Pain in unspecified knee: Secondary | ICD-10-CM

## 2013-06-24 DIAGNOSIS — M25559 Pain in unspecified hip: Secondary | ICD-10-CM

## 2013-06-24 DIAGNOSIS — M25552 Pain in left hip: Secondary | ICD-10-CM

## 2013-06-24 NOTE — Telephone Encounter (Signed)
The patient has still not heard back on pain management referral.  He has been to [facility] in the past and would like another referral there.

## 2013-06-24 NOTE — Telephone Encounter (Signed)
Pcc/s see below

## 2013-06-24 NOTE — Assessment & Plan Note (Signed)
Patient has injection as described above. Patient tolerated the procedure very well and had decreased pain immediately. Patient was able to ambulate somewhat better. Secondary to patient's knee though he doesn't need to have a revision done. Patient may come back again and have a repeat on the right hip if that continues to be painful as well.

## 2013-06-24 NOTE — Telephone Encounter (Signed)
I have no control over the referral process, I will forward the concern to the [facility]  Remember, there is no guarantee a pt will be accepted to a pain clinic, as they do not automatically do this, and all pts are screened for appropriateness by those clinics

## 2013-06-24 NOTE — Progress Notes (Signed)
I'm seeing this patient by the request  of:  [name], MD   CC:leg and hp pain bilaterally.   HPI: Patient is a 63 year old gentleman with a past medical history significant for chronic pain syndrome, degenerative joint disease, lumbar disc disease, bilateral rotator cuff repair with failure on right and knee replacement coming in with complaints had left hip pain. Patient states that the right hip it is starting to get worse as well. No new symptoms is worsening of previous symptoms are described in previous notes.    Patient continues to have his left hip pain. He has had it for multiple years. Patient feels he is compensating for his right knee and likely has made the pain worse. Patient is looking for physician to do a right knee revision. Patient is wanting further intervention.    Past medical, surgical, family and social history reviewed. Medications reviewed all in the electronic medical record.   Review of Systems: No headache, visual changes, nausea, vomiting, diarrhea, constipation, dizziness, abdominal pain, skin rash, fevers, chills, night sweats, weight loss, swollen lymph nodes, body aches, joint swelling, muscle aches, chest pain, shortness of breath, mood changes.   Objective:    Blood pressure 138/74, pulse 83, temperature 97.9 F (36.6 C), temperature source Oral, resp. rate 18, weight 270 lb 9.6 oz (122.743 kg), SpO2 94.00%.   General: No apparent distress alert and oriented x3 mood and affect normal, dressed appropriately. Patient is overweight HEENT: Pupils equal, extraocular movements intact Respiratory: Patient's speak in full sentences and does not appear short of breath Cardiovascular: No lower extremity edema, non tender, no erythema Skin: Warm dry intact with no signs of infection or rash on extremities or on axial skeleton. Abdomen: Soft nontender Neuro: Cranial nerves II through XII are intact, neurovascularly intact in all extremities  Lymph: No  lymphadenopathy of posterior or anterior cervical chain or axillae bilaterally.  Gait shows patient does have extreme external rotation of the right leg secondary to knee replacement as well as foot drop. Patient is wearing a brace on his right ankle. Patient has a significant antalgic gait as well as a flexed lumbar spine negative very difficult to ambulate correctly.  MSK:  Neck: Inspection unremarkable. No palpable stepoffs. Negative Spurling's maneuver. Mild restriction of range of motion in all planes Grip strength and sensation normal in bilateral hands Strength good C4 to T1 distribution No sensory change to C4 to T1 Negative Hoffman sign bilaterally Reflexes normal Back Exam:  Inspection: Unremarkable  Motion: Flexion 25 deg, Extension 25 deg, Side Bending to 35 deg bilaterally,  Rotation to 45 deg bilaterally  SLR laying: Positive left XSLR laying: Negative  Palpable tenderness: Diffuse of the paraspinal musculature of the thoracic and lumbar spine bilaterally FABER: Positive for difficult to assess secondary to patient having knee replacement bilaterally. Sensory change: Gross sensation intact to all lumbar and sacral dermatomes.  Reflexes: 2+ at both patellar tendons, 2+ at achilles tendons, Babinski's downgoing.  Strength at foot  Plantar-flexion: 3/5 on the right with 4/5 on the Dorsi-flexion: 3/5 on right and 4-5 on left Eversion: 5/5 Inversion: 5/5  Leg strength  Gait unremarkable.  Hip: Left ROM IR: 15 Deg, ER: 45 Deg, Flexion: 120 Deg, Extension: 100 Deg, Abduction: 45 Deg, Adduction: 45 Deg Strength IR: 4/5, ER: 4/5, Flexion: 5/5, Extension: 5/5, Abduction: 3/5, Adduction: 4/5 Pelvic alignment unremarkable to inspection and palpation. Standing hip rotation and gait without trendelenburg sign / unsteadiness. Greater trochanter with mild tenderness to palpation. No tenderness  over piriformis  Positive pain with FABER in severe pain with FADIR. No SI joint  tenderness and normal minimal SI movement.   Procedure: Real-time Ultrasound Guided Injection of left intra-articular hip Device: GE Logiq E  Ultrasound guided injection is preferred based studies that show increased duration, increased effect, greater accuracy, decreased procedural pain, increased response rate with ultrasound guided versus blind injection.  Verbal informed consent obtained.  Time-out conducted.  Noted no overlying erythema, induration, or other signs of local infection.  Skin prepped in a sterile fashion.  Local anesthesia: Topical Ethyl chloride.  With sterile technique and under real time ultrasound guidance:  Anterior capsule visualized, needle visualized going to the head neck junction at the anterior capsule. Pictures taken. Patient did have injection of 3 cc of 1% lidocaine, 3 cc of 0.5% Marcaine, and 1 cc of Kenalog 40 mg/dL. Completed without difficulty  Pain immediately resolved suggesting accurate placement of the medication.  Advised to call if fevers/chills, erythema, induration, drainage, or persistent bleeding.  Images permanently stored and available for review in the ultrasound unit.  Impression: Technically successful ultrasound guided injection.    Impression and Recommendations:     This case required medical decision making of moderate complexity. Spent greater than 25 minutes with patient face-to-face and had greater than 50% of counseling including as described above in assessment and plan.

## 2013-06-24 NOTE — Patient Instructions (Signed)
Good to see you We will send you to Dr. Hal Morales If you want an injection in right hip come back but make sure it is a 30 minute appointment.

## 2013-06-24 NOTE — Progress Notes (Signed)
Pre-visit discussion using our clinic review tool. No additional management support is needed unless otherwise documented below in the visit note.  

## 2013-06-25 ENCOUNTER — Ambulatory Visit (INDEPENDENT_AMBULATORY_CARE_PROVIDER_SITE_OTHER): Payer: Medicare HMO | Admitting: Neurology

## 2013-06-25 ENCOUNTER — Encounter: Payer: Self-pay | Admitting: Neurology

## 2013-06-25 VITALS — BP 138/70 | HR 72 | Temp 98.4°F | Wt 269.1 lb

## 2013-06-25 DIAGNOSIS — E538 Deficiency of other specified B group vitamins: Secondary | ICD-10-CM

## 2013-06-25 DIAGNOSIS — R259 Unspecified abnormal involuntary movements: Secondary | ICD-10-CM

## 2013-06-25 DIAGNOSIS — G609 Hereditary and idiopathic neuropathy, unspecified: Secondary | ICD-10-CM

## 2013-06-25 DIAGNOSIS — R251 Tremor, unspecified: Secondary | ICD-10-CM

## 2013-06-25 NOTE — Progress Notes (Signed)
Travis [name] was seen today in the movement disorders clinic for neurologic consultation at the request of [name], MD.  The consultation is for the evaluation of balance changes, tremor and to r/o PD.  The pt is R handed and noted balance changes 7-8 months ago.  The pt states that initially it was a little "off balance" to now more significant loss of balance.  He falls 2-3 times per day.  He can fall forward or backward.  He has had multiple bruises from falling; he has had no fractures.  06/25/13 update:  Pt was seen today in f/u.  No falls.  Balance much better.  Some trouble with turning or if getting off couch but overall better.  On abilify now and mood much better.  Feels pretty good.  Admits tremor has increased.  Had DaT scan since our last visit that was normal.  He also had an EMG demonstrating peripheral neuropathy as well as a chronic L5 radiculopathy bilaterally, is overall mild.  He reports DM hx today, but I don't see that on his prob list or PMHx.  His last A1C was 5.9.  His B12 was mildly low at 318.  Neuroimaging has previously been performed.  It is available for my review today.  His MRI of the brain was done on 03/22/2013.  There is mild small vessel disease.  The formal report is below:  MRI HEAD WITHOUT CONTRAST  TECHNIQUE:  Multiplanar, multisequence MR imaging was performed. No intravenous  contrast was administered.  COMPARISON: CT head 03/17/2013.  FINDINGS:  No acute stroke or hemorrhage. No mass lesion or hydrocephalus. No  extra-axial fluid. Mild cerebral and cerebellar atrophy. Mild  chronic microvascular ischemic change affects the periventricular  greater than subcortical white matter. No brainstem or cerebellar  involvement. No lacunar or large vessel infarct is evident. Flow  voids are maintained throughout the major intracranial vascular  structures. No evidence for chronic hemorrhage. Calvarium, skull  base, and upper cervical region appear  unremarkable. No pituitary or  cerebellar tonsil abnormality. Scalp, extracranial soft tissues, and  orbits appear unremarkable. Small bilateral mastoid effusions.  Bilateral ethmoid sinus disease is greater on the left. Good  agreement with prior CT.  IMPRESSION:  Mild cerebral and cerebellar atrophy. Mild chronic microvascular  ischemic change. No acute intracranial findings. No evidence for  occult intracranial hemorrhage or recent acute stroke.    PREVIOUS MEDICATIONS: none to date  ALLERGIES:  No Known Allergies  CURRENT MEDICATIONS:  Current Outpatient Prescriptions on File Prior to Visit  Medication Sig Dispense Refill  . amLODipine (NORVASC) 5 MG tablet Take 5 mg by mouth daily.      . ARIPiprazole (ABILIFY) 5 MG tablet Take 1 tablet (5 mg total) by mouth daily.  30 tablet  2  . aspirin 81 MG tablet Take 1 tablet (81 mg total) by mouth daily.  30 tablet  0  . atorvastatin (LIPITOR) 20 MG tablet Take 20 mg by mouth at bedtime.      . benazepril (LOTENSIN) 20 MG tablet TAKE 1 TABLET BY MOUTH EVERY DAY  90 tablet  3  . benzonatate (TESSALON PERLES) 100 MG capsule 1-2 tabs by mouth three times per day as needed for cough  90 capsule  3  . clonazePAM (KLONOPIN) 2 MG tablet Take 1 tablet (2 mg total) by mouth 2 (two) times daily as needed for anxiety.  60 tablet  2  . divalproex (DEPAKOTE) 250 MG DR tablet TAKE 1 TABLET BY  MOUTH EVERY MORNING AND 2 AT BEDTIME  270 tablet  0  . EFFIENT 10 MG TABS tablet TAKE 1 TABLET BY MOUTH DAILY  30 tablet  3  . FLUoxetine (PROZAC) 40 MG capsule Take 1 capsule (40 mg total) by mouth daily.  90 capsule  0  . fluticasone (FLONASE) 50 MCG/ACT nasal spray PLACE 1 SPRAY INTO EACH NOSTRIL DAILY  16 g  11  . gabapentin (NEURONTIN) 400 MG capsule Take 800 mg by mouth 4 (four) times daily.      . indomethacin (INDOCIN) 25 MG capsule Take 50 mg by mouth 2 (two) times daily with a meal.       . isosorbide mononitrate (IMDUR) 60 MG 24 hr tablet Take 90 mg by  mouth daily.      . metFORMIN (GLUCOPHAGE) 500 MG tablet TAKE 1 TABLET BY MOUTH TWICE DAILY WITH MEALS  60 tablet  11  . oxyCODONE (ROXICODONE) 15 MG immediate release tablet Take 1 tablet (15 mg total) by mouth every 6 (six) hours as needed for pain. With limit 4 per day - to fill Jun 21, 2013  120 tablet  0  . tiZANidine (ZANAFLEX) 4 MG tablet Take 1 tablet (4 mg total) by mouth every 6 (six) hours as needed for muscle spasms.  60 tablet  1  . traZODone (DESYREL) 100 MG tablet Take 1 tablet (100 mg total) by mouth at bedtime.  90 tablet  0   No current facility-administered medications on file prior to visit.    PAST MEDICAL HISTORY:   Past Medical History  Diagnosis Date  . Impaired glucose tolerance 09/27/2010  . HYPERLIPIDEMIA-MIXED 07/15/2009  . Chronic pain syndrome 10/27/2009  . HYPERTENSION 06/24/2009  . CORONARY ARTERY DISEASE 06/24/2009    a. s/p multiple PCIs - In 2008 he had a Taxus DES to the mild LAD, Endeavor DES to mid LCX and distal LCX. In January 2009 he had DES to distal LCX, mid LCX and proximal LCX. In November 2009 had BMS x 2 to the mid RCA. Cath 10/2011 with patent stents, noncardiac CP. LHC 01/2013: patent stents (noncardiac CP).  [name] URETHRAL STRICTURE 06/24/2009  . DEGENERATIVE JOINT DISEASE 06/24/2009  . SHOULDER PAIN, BILATERAL 06/24/2009  . ANKLE PAIN, RIGHT 06/24/2009  . [place] DISEASE, LUMBAR 04/19/2010  . SCIATICA, LEFT 04/19/2010  . NEPHROLITHIASIS, HX OF 06/24/2009  . ERECTILE DYSFUNCTION, ORGANIC 05/30/2010  . OSA (obstructive sleep apnea)     not using CPAP very often  . Obesity   . Allergy to clopidogrel   . ALLERGIC RHINITIS 06/24/2009  . Anxiety   . Depression     Prior suicide attempt    PAST SURGICAL HISTORY:   Past Surgical History  Procedure Laterality Date  . Tonsillectomy    . Rotator cuff repair      bilateral  . S/p knee replacement  2008  . S/p bilat wrist surgury    . S/p urethral dilations,  multi 2009,2010  . Carpal tunnel release Bilateral   .  S/p right knee arthroscopy  03/2005    and multi prior  . S/p umbilical hernia    . S/p left knee arthroscopy  june 2002    multi prior  . S/p right shoulder rotater cuff surgury  april 2011    Dr. [name] Lighter  . Coronary angioplasty with stent placement      SOCIAL HISTORY:   History   Social History  . Marital Status: Divorced    Spouse Name: N/A  Number of Children: 2  . Years of Education: N/A   Occupational History  . disabled since 2006 due to ortho. heart, psych   . part time work as an Multimedia programmer, wrestling, and baseball Agricultural consultant    Social History Main Topics  . Smoking status: Former Smoker    Types: Cigars    Quit date: 08/28/2010  . Smokeless tobacco: Never Used     Comment: rare  . Alcohol Use: 0.0 oz/week     Comment: occasional (4 beers all summer)  . Drug Use: No  . Sexual Activity: Not Currently   Other Topics Concern  . Not on file   Social History Narrative   Played semi-pro football, used steroids   Divorced moved here from [place], [place]      03/05/2013 AHW "[name]" was born and grew up in [place], [place], and moved to [place], [place] at age 37. He has one sister and 3 brothers. He reports that his childhood was rough. His parents are deceased. He graduated from [employer], and attended one year of college. He is currently unemployed and on disability for multiple medical problems. He has been married 5 times. He has 2 sons. He currently lives alone. 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 He denies that he has any social support system. 03/05/2013 AHW             FAMILY HISTORY:   Family Status  Relation Status Death Age  . Mother Deceased     CA, unknown type  . Father Deceased     fall  . Brother Alive     3, healthy  . Sister Alive     healthy  . Son Alive     2, alive and well  . Maternal Grandmother Deceased   . Maternal Grandfather Deceased   . Paternal  Grandmother Deceased   . Paternal Grandfather Deceased     ROS:  Has recently lost a lot of weight trying (greater than 100 pounds).  With  the exception of the above, 10 system review of systems was obtained and was unremarkable apart from what is mentioned above.  PHYSICAL EXAMINATION:    VITALS:   Filed Vitals:   06/25/13 1013  BP: 138/70  Pulse: 72  Temp: 98.4 F (36.9 C)  Weight: 269 lb 1 oz (122.046 kg)    GEN:  The patient appears stated age and is in NAD. HEENT:  Normocephalic, atraumatic.  The mucous membranes are moist. The superficial temporal arteries are without ropiness or tenderness. CV:  RRR Lungs:  CTAB Neck/HEME:  There are no carotid bruits bilaterally.  Neurological examination:  Orientation: The patient is alert and oriented x3. Fund of knowledge is appropriate.  Recent and remote memory are intact.  Attention and concentration are normal.    Able to name objects and repeat phrases. Cranial nerves: There is good facial symmetry.  Eyelids were normal today. Pupils are equal round and reactive to light bilaterally. Fundoscopic exam reveals clear margins bilaterally. Extraocular muscles are intact. The visual fields are full to confrontational testing. The speech is fluent and clear. Soft palate rises symmetrically and there is no tongue deviation. Hearing is intact to conversational tone. Sensation: Sensation is intact to light and pinprick throughout (facial, trunk, extremities). Vibration is intact at the bilateral big toe. There is no extinction with double simultaneous stimulation. There is no sensory dermatomal level identified. Motor: Strength is 5/5 in the left  upper extremity and distally in the right upper extremity.  The patient has limited ability to abduct the shoulder on the right.  Strength in the proximal and distal lower extremities is 4/5 with notable give way weakness.   Shoulder shrug is equal and symmetric.  There is no pronator drift. Deep tendon  reflexes: Deep tendon reflexes are 2/4 at the bilateral biceps, triceps, brachioradialis, patella and absent at the bilateral achilles. Plantar responses are downgoing bilaterally.  Movement examination: Tone: There is normal tone in the upper extremities, but there may be some mild increase with activation procedure on the right.  Tone in the legs and on the left is normal.   Abnormal movements: There is an intermittent distractible tremor in the RUE. Coordination:  There is mild decremation with RAM's, seen on the R with finger taps. Gait and Station: The patient arises quickly out of the chair and walks with good stride length although his gait is antalgic.    Lab Results  Component Value Date   CKTOTAL 60 03/25/2013   Lab Results  Component Value Date   TSH 0.74 03/25/2013   Lab Results  Component Value Date   VITAMINB12 318 03/25/2013   Lab Results  Component Value Date   WBC 4.9 05/03/2013   HGB 11.4* 05/03/2013   HCT 33.2* 05/03/2013   MCV 92.0 05/03/2013   PLT 199 05/03/2013     Chemistry      Component Value Date/Time   NA 142 05/03/2013 0052   K 3.6 05/03/2013 0052   CL 107 05/03/2013 0052   CO2 28 05/03/2013 0052   BUN 20 05/03/2013 0052   CREATININE 0.82 05/03/2013 0052      Component Value Date/Time   CALCIUM 8.3* 05/03/2013 0052   ALKPHOS 59 05/03/2013 0052   AST 13 05/03/2013 0052   ALT 10 05/03/2013 0052   BILITOT 0.2* 05/03/2013 0052     Lab Results  Component Value Date   HGBA1C 5.9 02/07/2013     ASSESSMENT/PLAN:  1.  Peripheral neuropathy  -We discussed safety associated with peripheral neuropathy.  He has a mild B12 deficiency.  It would be my recommendation that he begin the B12 supplements, 1000 mcg daily.  -His EMG didn't demonstrate also a mild bilateral chronic L5 radiculopathies.  There were no acute changes on EMG associated with this. 2.  Tremor  -DaT scan was negative.    -He recently started on Abilify, and he and I talked about  the fact that this definitely can worsen the symptoms, as can Depakote.  He understands that, and is willing to take the risk as he feels much better on the medication. 3.  F/u prn

## 2013-06-27 ENCOUNTER — Encounter: Payer: Self-pay | Admitting: Neurology

## 2013-06-27 ENCOUNTER — Telehealth: Payer: Self-pay | Admitting: *Deleted

## 2013-06-27 NOTE — Telephone Encounter (Signed)
Patient left message on triage voicemail at 1152 today regarding an injection he received earlier in the week by Z. Smith.  Attempted to call patient back for need clarification.  [facility] on patient voicemail.

## 2013-06-30 ENCOUNTER — Other Ambulatory Visit: Payer: Self-pay | Admitting: Internal Medicine

## 2013-07-03 ENCOUNTER — Other Ambulatory Visit: Payer: Self-pay | Admitting: Internal Medicine

## 2013-07-03 ENCOUNTER — Ambulatory Visit (HOSPITAL_BASED_OUTPATIENT_CLINIC_OR_DEPARTMENT_OTHER): Payer: Medicare HMO | Attending: Internal Medicine

## 2013-07-03 ENCOUNTER — Ambulatory Visit (INDEPENDENT_AMBULATORY_CARE_PROVIDER_SITE_OTHER): Payer: Commercial Managed Care - HMO | Admitting: Psychology

## 2013-07-03 DIAGNOSIS — F331 Major depressive disorder, recurrent, moderate: Secondary | ICD-10-CM

## 2013-07-03 NOTE — Telephone Encounter (Signed)
Done hardcopy to [facility]  

## 2013-07-03 NOTE — Telephone Encounter (Signed)
Faxed hardcopy to [facility]

## 2013-07-08 ENCOUNTER — Other Ambulatory Visit: Payer: Self-pay | Admitting: Internal Medicine

## 2013-07-16 ENCOUNTER — Ambulatory Visit: Payer: Commercial Managed Care - HMO | Admitting: Psychology

## 2013-07-16 DIAGNOSIS — F331 Major depressive disorder, recurrent, moderate: Secondary | ICD-10-CM

## 2013-07-17 ENCOUNTER — Ambulatory Visit: Payer: Self-pay | Admitting: Psychology

## 2013-07-17 ENCOUNTER — Ambulatory Visit: Payer: Self-pay | Admitting: Internal Medicine

## 2013-07-18 ENCOUNTER — Ambulatory Visit: Payer: Self-pay | Admitting: Psychology

## 2013-07-29 ENCOUNTER — Ambulatory Visit (HOSPITAL_COMMUNITY): Payer: Self-pay | Admitting: Psychiatry

## 2013-07-31 ENCOUNTER — Ambulatory Visit: Payer: Self-pay | Admitting: Psychology

## 2013-07-31 ENCOUNTER — Ambulatory Visit: Payer: Medicare HMO | Admitting: Psychology

## 2013-08-08 ENCOUNTER — Telehealth: Payer: Self-pay | Admitting: Cardiology

## 2013-08-08 NOTE — Telephone Encounter (Signed)
LMTCB I have checked with medical records & faxes. We received the fax today at 11:56 am from [facility]

## 2013-08-08 NOTE — Telephone Encounter (Signed)
Surgical clearance fax is now on Dr. Claris Gladden cart at [facility]. Pt is aware Dr. Aundra Dubin will be in the office Monday afternoon  Horton Chin RN

## 2013-08-08 NOTE — Telephone Encounter (Signed)
New message     Patient calling  Form  was fax over for clearance - injection in back .     Form coming from [facility]

## 2013-08-11 NOTE — Telephone Encounter (Signed)
Dr Aundra Dubin signed clearance form for epidural injection--may hold effient for 7 days prior to injection, restart afterwards. Pt advised and will forward form to HIM to be faxed to Dr Ernestina Patches.

## 2013-08-12 ENCOUNTER — Telehealth: Payer: Self-pay | Admitting: Cardiology

## 2013-08-12 NOTE — Telephone Encounter (Signed)
Received request from Nurse fax box, documents faxed for surgical clearance. To: [facility] number: [phone] Attention: [phone]/[id number]

## 2013-08-13 ENCOUNTER — Encounter (HOSPITAL_COMMUNITY): Payer: Self-pay | Admitting: Psychiatry

## 2013-08-13 ENCOUNTER — Other Ambulatory Visit: Payer: Self-pay | Admitting: Cardiology

## 2013-08-13 ENCOUNTER — Ambulatory Visit (INDEPENDENT_AMBULATORY_CARE_PROVIDER_SITE_OTHER): Payer: Commercial Managed Care - HMO | Admitting: Psychiatry

## 2013-08-13 VITALS — BP 143/60 | HR 62 | Ht 73.0 in | Wt 281.4 lb

## 2013-08-13 DIAGNOSIS — F39 Unspecified mood [affective] disorder: Secondary | ICD-10-CM

## 2013-08-13 MED ORDER — ARIPIPRAZOLE 5 MG PO TABS: 5.0000 mg | ORAL_TABLET | Freq: Every day | ORAL | Status: DC

## 2013-08-13 MED ORDER — FLUOXETINE HCL 40 MG PO CAPS
40.0000 mg | ORAL_CAPSULE | Freq: Every day | ORAL | Status: DC
Start: 2013-08-13 — End: 2013-09-02

## 2013-08-13 MED ORDER — TRAZODONE HCL 100 MG PO TABS: 100.0000 mg | ORAL_TABLET | Freq: Every day | ORAL | Status: DC

## 2013-08-13 NOTE — Progress Notes (Signed)
Travis [name] [phone] Progress Note  Travis [name] [id number] 63 y.o.  08/13/2013 2:07 PM  Chief Complaint:  Medication management and followup.    History of Present Illness:  Travis [name] came for his followup appointment.  He is compliant with Abilify 5 mg daily, Prozac 40 mg daily, Depakote and trazodone 100 mg at bedtime.  He denies any tremors or any shakes.  His mood has been stable.  He is sleeping better.  He has increased energy and motivation.  He denies any irritability, anger, mood swing auditory hallucination.  He is not drinking or using any legal substances.  He is also taking Prozac 40 mg daily.  He still takes Klonopin as prescribed by his primary care physician. He is seeing therapist Dr. Cheryln Manly for counseling.  Travis [name] was to continue his current psychotropic medication.  He denies any paranoia or aggression.  He is sleeping better.  Suicidal Ideation: No Plan Formed: No Travis [name] has means to carry out plan: No  Homicidal Ideation: No Plan Formed: No Travis [name] has means to carry out plan: No  Past Psychiatric History/Hospitalization(s) Travis [name] started seeing a therapist since age 63.  He endorsed long history of depression and mania.[name]  He was in the treatment when he was in [place] then in [place] and then in [place] and also in [place].  He remembered taking psychotropic medication for at least 15-20 years.  In the past he had tried Effexor, Xanax, Klonopin, Ambien , Lunesta, Wellbutrin, Ambien and Remeron.  He never tried mood stabilizer until recently they started him on Abilify.  Travis [name] endorses history of mood swings, irritability, poor impulse control and risky behavior.  He spent $13,000 on his friend which was given to him to use only as needed.  Travis [name] also endorsed history of auditory hallucination on and off in the past.  He has one psychiatric hospitalization in February 2014 when he took overdose on Xanax with alcohol.  As per chart there is a  history of at least 2 suicidal gestures in the past.  Travis [name] has done at least twice intensive outpatient program. Anxiety: Yes Bipolar Disorder: Yes Depression: Yes Mania: Yes Psychosis: Yes Schizophrenia: No Personality Disorder: No Hospitalization for psychiatric illness: Yes History of Electroconvulsive Shock Therapy: No Prior Suicide Attempts: Yes  Medical History; Travis [name] has hypertension, coronary artery disease, allergic rhinitis, type 2 diabetes mellitus, left leg pain, degenerative joint disease, chronic back pain, urethral stricture, and about his function, hyperlipidemia, nephrolithiasis and chronic fatigue.  His primary care physician is Dr. Kandee Keen.  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  He spent a good amount of time in [place].  He moved back to [place] on a 5 years ago.  He married 5 times.  He has 2 children however he has no contact with them.  Travis [name] told they don't talk to him anymore.  Travis [name] has been in a relationship multiple times in the past 2 years.  Travis [name] currently lives by himself.  Review of Systems: Psychiatric: Agitation: No Hallucination: No Depressed Mood: No Insomnia: No Hypersomnia: No Altered Concentration: No Feels Worthless: No Grandiose Ideas: No Belief In Special Powers: No New/Increased Substance Abuse: No Compulsions: No  Neurologic: Headache: No Seizure: No Paresthesias: Yes    Outpatient Encounter Prescriptions as of 08/13/2013  Medication Sig  . amLODipine (NORVASC) 5 MG tablet Take 5 mg by mouth daily.  . ARIPiprazole (ABILIFY) 5 MG tablet Take 1 tablet (5 mg total) by  mouth daily.  [name] aspirin 81 MG tablet Take 1 tablet (81 mg total) by mouth daily.  [name] atorvastatin (LIPITOR) 20 MG tablet TAKE 1 TABLET BY MOUTH EVERY DAY  . benazepril (LOTENSIN) 20 MG tablet TAKE 1 TABLET BY MOUTH EVERY DAY  . benzonatate (TESSALON PERLES) 100 MG capsule 1-2 tabs by mouth three  times per day as needed for cough  . clonazePAM (KLONOPIN) 2 MG tablet TAKE 1 TABLET BY MOUTH TWICE DAILY AS NEEDED FOR ANXIETY  . cyclobenzaprine (FLEXERIL) 5 MG tablet TAKE 1 TABLET BY MOUTH THREE TIMES DAILY AS NEEDED MUSCLE SPASMS  . divalproex (DEPAKOTE) 250 MG DR tablet TAKE 1 TABLET BY MOUTH EVERY MORNING AND 2 AT BEDTIME  . EFFIENT 10 MG TABS tablet TAKE 1 TABLET BY MOUTH DAILY  . FLUoxetine (PROZAC) 40 MG capsule Take 1 capsule (40 mg total) by mouth daily.  . fluticasone (FLONASE) 50 MCG/ACT nasal spray PLACE 1 SPRAY INTO EACH NOSTRIL DAILY  . gabapentin (NEURONTIN) 400 MG capsule Take 800 mg by mouth 4 (four) times daily.  . indomethacin (INDOCIN) 25 MG capsule Take 50 mg by mouth 2 (two) times daily with a meal.   . isosorbide mononitrate (IMDUR) 60 MG 24 hr tablet Take 90 mg by mouth daily.  . metFORMIN (GLUCOPHAGE) 500 MG tablet TAKE 1 TABLET BY MOUTH TWICE DAILY WITH MEALS  . oxyCODONE (ROXICODONE) 15 MG immediate release tablet Take 1 tablet (15 mg total) by mouth every 6 (six) hours as needed for pain. With limit 4 per day - to fill Jun 21, 2013  . tiZANidine (ZANAFLEX) 4 MG tablet Take 1 tablet (4 mg total) by mouth every 6 (six) hours as needed for muscle spasms.  . traZODone (DESYREL) 100 MG tablet Take 1 tablet (100 mg total) by mouth at bedtime.  . [DISCONTINUED] ARIPiprazole (ABILIFY) 5 MG tablet Take 1 tablet (5 mg total) by mouth daily.  . [DISCONTINUED] FLUoxetine (PROZAC) 40 MG capsule Take 1 capsule (40 mg total) by mouth daily.  . [DISCONTINUED] traZODone (DESYREL) 100 MG tablet Take 1 tablet (100 mg total) by mouth at bedtime.    Physical Exam: Constitutional:  BP 143/60  Pulse 62  Ht 6' 1"  (1.854 m)  Wt 281 lb 6.4 oz (127.642 kg)  BMI 37.13 kg/m2   Musculoskeletal: Strength & Muscle Tone: decreased Gait & Station: Difficulty walking due to pain Travis [name] leans: Front  Mental Status Examination;  Travis [name] is casually dressed and fairly groomed.  He  maintains fair eye contact.  His speech is coherent.  He described his mood as good and his affect is improved from the past.  He denies any suicidal thoughts or homicidal thoughts.  He denies any auditory hallucination or visual hallucination.  There were no paranoia , delusions or any of the session.  He denies any tremors or shakes.  His fund of knowledge is adequate.  He is alert and oriented x3.  His insight judgment and impulse control is fair.  Established Problem, Stable/Improving (1), Review of Last Therapy Session (1) and Review of Medication Regimen & Side Effects (2)  Assessment: Axis I: Mood disorder NOS, rule out bipolar disorder depressed type  Axis II: Deferred  Axis III:  Past Medical History  Diagnosis Date  . Impaired glucose tolerance 09/27/2010  . HYPERLIPIDEMIA-MIXED 07/15/2009  . Chronic pain syndrome 10/27/2009  . HYPERTENSION 06/24/2009  . CORONARY ARTERY DISEASE 06/24/2009    a. s/p multiple PCIs - In 2008 he had a Taxus DES to  the mild LAD, Endeavor DES to mid LCX and distal LCX. In January 2009 he had DES to distal LCX, mid LCX and proximal LCX. In November 2009 had BMS x 2 to the mid RCA. Cath 10/2011 with patent stents, noncardiac CP. LHC 01/2013: patent stents (noncardiac CP).  [name] URETHRAL STRICTURE 06/24/2009  . DEGENERATIVE JOINT DISEASE 06/24/2009  . SHOULDER PAIN, BILATERAL 06/24/2009  . ANKLE PAIN, RIGHT 06/24/2009  . [place] DISEASE, LUMBAR 04/19/2010  . SCIATICA, LEFT 04/19/2010  . NEPHROLITHIASIS, HX OF 06/24/2009  . ERECTILE DYSFUNCTION, ORGANIC 05/30/2010  . OSA (obstructive sleep apnea)     not using CPAP very often  . Obesity   . Allergy to clopidogrel   . ALLERGIC RHINITIS 06/24/2009  . Anxiety   . Depression     Prior suicide attempt    Axis IV: Moderate   Plan:  Travis [name] is doing better on his current psychotropic medication.  Travis [name] does not have any side effects.  We will consider blood work including Depakote level on his next appointment.  Recommended to  continue to see his therapist Dr. Cheryln Manly for counseling.  Recommended to call us back if he has any question or any concern.  Followup in 3 months.  Ascencion Stegner T., MD 08/13/2013

## 2013-08-14 ENCOUNTER — Other Ambulatory Visit: Payer: Self-pay | Admitting: Cardiology

## 2013-08-19 ENCOUNTER — Telehealth: Payer: Self-pay | Admitting: Cardiology

## 2013-08-19 MED ORDER — SILDENAFIL CITRATE 50 MG PO TABS: 50.0000 mg | ORAL_TABLET | Freq: Every day | ORAL | Status: DC | PRN

## 2013-08-19 NOTE — Telephone Encounter (Signed)
Explained to pt that because he is on Imdur he can not take Levitra.  Pt is upset because he states "Dr Aundra Dubin gave it to me before.  He just told me not to take the Imdur for 3 days before."  He insisted I ask again for the RX.

## 2013-08-19 NOTE — Telephone Encounter (Signed)
RX sent into pharmacy as ordered with instruction to include to not use Imdur 3 days before or 1 day after Viagra.  He was warned of the risks to using the 2 together.

## 2013-08-19 NOTE — Telephone Encounter (Signed)
Not on medication list.  Will forward to Dr Aundra Dubin for review and any orders

## 2013-08-19 NOTE — Telephone Encounter (Signed)
If he holds Imdur for 3 days prior and 1 day after he can take Viagra 50 mg prn.  I would rather him not take a long-acting med like Levitra.  Warn him about risk of nitrates + Viagra.

## 2013-08-19 NOTE — Telephone Encounter (Signed)
New problem   Pt need a new prescription call in for Levitra 32m at [facility] Pt need it has soon as possible. Please call if any questions.

## 2013-08-19 NOTE — Telephone Encounter (Signed)
He is on Imdur for angina. Cannot take Levitra and Imdur together.

## 2013-08-27 ENCOUNTER — Ambulatory Visit (INDEPENDENT_AMBULATORY_CARE_PROVIDER_SITE_OTHER): Payer: Commercial Managed Care - HMO | Admitting: Psychology

## 2013-08-27 DIAGNOSIS — F331 Major depressive disorder, recurrent, moderate: Secondary | ICD-10-CM

## 2013-08-28 ENCOUNTER — Telehealth: Payer: Self-pay | Admitting: *Deleted

## 2013-08-28 DIAGNOSIS — G894 Chronic pain syndrome: Secondary | ICD-10-CM

## 2013-08-28 NOTE — Telephone Encounter (Signed)
Pt called states he has found a [facility] that accepts his insurance.  It is [facility] in [place]; [phone] phone.  Please send referral

## 2013-08-28 NOTE — Telephone Encounter (Signed)
Referral done

## 2013-09-02 ENCOUNTER — Emergency Department (HOSPITAL_COMMUNITY): Payer: Medicare HMO

## 2013-09-02 ENCOUNTER — Inpatient Hospital Stay (HOSPITAL_COMMUNITY)
Admission: EM | Admit: 2013-09-02 | Discharge: 2013-09-04 | DRG: 552 | Disposition: A | Discharge status: Home / Self Care | Payer: Medicare HMO | Attending: Internal Medicine | Admitting: Internal Medicine

## 2013-09-02 ENCOUNTER — Encounter (HOSPITAL_COMMUNITY): Payer: Self-pay | Admitting: Emergency Medicine

## 2013-09-02 DIAGNOSIS — R001 Bradycardia, unspecified: Secondary | ICD-10-CM

## 2013-09-02 DIAGNOSIS — F419 Anxiety disorder, unspecified: Secondary | ICD-10-CM

## 2013-09-02 DIAGNOSIS — E785 Hyperlipidemia, unspecified: Secondary | ICD-10-CM

## 2013-09-02 DIAGNOSIS — M79644 Pain in right finger(s): Secondary | ICD-10-CM

## 2013-09-02 DIAGNOSIS — M47812 Spondylosis without myelopathy or radiculopathy, cervical region: Principal | ICD-10-CM | POA: Diagnosis present

## 2013-09-02 DIAGNOSIS — R209 Unspecified disturbances of skin sensation: Secondary | ICD-10-CM | POA: Diagnosis present

## 2013-09-02 DIAGNOSIS — Z8249 Family history of ischemic heart disease and other diseases of the circulatory system: Secondary | ICD-10-CM

## 2013-09-02 DIAGNOSIS — M25552 Pain in left hip: Secondary | ICD-10-CM

## 2013-09-02 DIAGNOSIS — L84 Corns and callosities: Secondary | ICD-10-CM

## 2013-09-02 DIAGNOSIS — M719 Bursopathy, unspecified: Secondary | ICD-10-CM | POA: Diagnosis present

## 2013-09-02 DIAGNOSIS — R251 Tremor, unspecified: Secondary | ICD-10-CM

## 2013-09-02 DIAGNOSIS — M51369 Other intervertebral disc degeneration, lumbar region without mention of lumbar back pain or lower extremity pain: Secondary | ICD-10-CM

## 2013-09-02 DIAGNOSIS — M75101 Unspecified rotator cuff tear or rupture of right shoulder, not specified as traumatic: Secondary | ICD-10-CM

## 2013-09-02 DIAGNOSIS — Z0000 Encounter for general adult medical examination without abnormal findings: Secondary | ICD-10-CM

## 2013-09-02 DIAGNOSIS — F329 Major depressive disorder, single episode, unspecified: Secondary | ICD-10-CM

## 2013-09-02 DIAGNOSIS — R29818 Other symptoms and signs involving the nervous system: Secondary | ICD-10-CM

## 2013-09-02 DIAGNOSIS — G4733 Obstructive sleep apnea (adult) (pediatric): Secondary | ICD-10-CM

## 2013-09-02 DIAGNOSIS — Z9861 Coronary angioplasty status: Secondary | ICD-10-CM

## 2013-09-02 DIAGNOSIS — M545 Low back pain, unspecified: Secondary | ICD-10-CM

## 2013-09-02 DIAGNOSIS — M51379 Other intervertebral disc degeneration, lumbosacral region without mention of lumbar back pain or lower extremity pain: Secondary | ICD-10-CM

## 2013-09-02 DIAGNOSIS — F39 Unspecified mood [affective] disorder: Secondary | ICD-10-CM

## 2013-09-02 DIAGNOSIS — R5381 Other malaise: Secondary | ICD-10-CM

## 2013-09-02 DIAGNOSIS — I959 Hypotension, unspecified: Secondary | ICD-10-CM | POA: Diagnosis present

## 2013-09-02 DIAGNOSIS — M25519 Pain in unspecified shoulder: Secondary | ICD-10-CM

## 2013-09-02 DIAGNOSIS — E1165 Type 2 diabetes mellitus with hyperglycemia: Secondary | ICD-10-CM

## 2013-09-02 DIAGNOSIS — Z96659 Presence of unspecified artificial knee joint: Secondary | ICD-10-CM

## 2013-09-02 DIAGNOSIS — M1990 Unspecified osteoarthritis, unspecified site: Secondary | ICD-10-CM

## 2013-09-02 DIAGNOSIS — E669 Obesity, unspecified: Secondary | ICD-10-CM

## 2013-09-02 DIAGNOSIS — F319 Bipolar disorder, unspecified: Secondary | ICD-10-CM | POA: Diagnosis present

## 2013-09-02 DIAGNOSIS — J069 Acute upper respiratory infection, unspecified: Secondary | ICD-10-CM

## 2013-09-02 DIAGNOSIS — E1169 Type 2 diabetes mellitus with other specified complication: Secondary | ICD-10-CM | POA: Diagnosis present

## 2013-09-02 DIAGNOSIS — M5030 Other cervical disc degeneration, unspecified cervical region: Secondary | ICD-10-CM | POA: Diagnosis present

## 2013-09-02 DIAGNOSIS — M5136 Other intervertebral disc degeneration, lumbar region: Secondary | ICD-10-CM

## 2013-09-02 DIAGNOSIS — F411 Generalized anxiety disorder: Secondary | ICD-10-CM | POA: Diagnosis present

## 2013-09-02 DIAGNOSIS — G4700 Insomnia, unspecified: Secondary | ICD-10-CM

## 2013-09-02 DIAGNOSIS — F3289 Other specified depressive episodes: Secondary | ICD-10-CM

## 2013-09-02 DIAGNOSIS — R269 Unspecified abnormalities of gait and mobility: Secondary | ICD-10-CM

## 2013-09-02 DIAGNOSIS — Z87442 Personal history of urinary calculi: Secondary | ICD-10-CM

## 2013-09-02 DIAGNOSIS — Z833 Family history of diabetes mellitus: Secondary | ICD-10-CM

## 2013-09-02 DIAGNOSIS — M12812 Other specific arthropathies, not elsewhere classified, left shoulder: Secondary | ICD-10-CM | POA: Diagnosis present

## 2013-09-02 DIAGNOSIS — Z79899 Other long term (current) drug therapy: Secondary | ICD-10-CM

## 2013-09-02 DIAGNOSIS — M75102 Unspecified rotator cuff tear or rupture of left shoulder, not specified as traumatic: Secondary | ICD-10-CM

## 2013-09-02 DIAGNOSIS — M5430 Sciatica, unspecified side: Secondary | ICD-10-CM

## 2013-09-02 DIAGNOSIS — M67919 Unspecified disorder of synovium and tendon, unspecified shoulder: Secondary | ICD-10-CM | POA: Diagnosis present

## 2013-09-02 DIAGNOSIS — Z66 Do not resuscitate: Secondary | ICD-10-CM | POA: Diagnosis present

## 2013-09-02 DIAGNOSIS — IMO0001 Reserved for inherently not codable concepts without codable children: Secondary | ICD-10-CM

## 2013-09-02 DIAGNOSIS — R5383 Other fatigue: Secondary | ICD-10-CM

## 2013-09-02 DIAGNOSIS — R42 Dizziness and giddiness: Secondary | ICD-10-CM | POA: Diagnosis present

## 2013-09-02 DIAGNOSIS — N529 Male erectile dysfunction, unspecified: Secondary | ICD-10-CM

## 2013-09-02 DIAGNOSIS — N35919 Unspecified urethral stricture, male, unspecified site: Secondary | ICD-10-CM

## 2013-09-02 DIAGNOSIS — I2510 Atherosclerotic heart disease of native coronary artery without angina pectoris: Secondary | ICD-10-CM

## 2013-09-02 DIAGNOSIS — M5137 Other intervertebral disc degeneration, lumbosacral region: Secondary | ICD-10-CM

## 2013-09-02 DIAGNOSIS — L989 Disorder of the skin and subcutaneous tissue, unspecified: Secondary | ICD-10-CM

## 2013-09-02 DIAGNOSIS — G894 Chronic pain syndrome: Secondary | ICD-10-CM

## 2013-09-02 DIAGNOSIS — R2689 Other abnormalities of gait and mobility: Secondary | ICD-10-CM

## 2013-09-02 DIAGNOSIS — M25549 Pain in joints of unspecified hand: Secondary | ICD-10-CM

## 2013-09-02 DIAGNOSIS — Z6836 Body mass index (BMI) 36.0-36.9, adult: Secondary | ICD-10-CM

## 2013-09-02 DIAGNOSIS — R509 Fever, unspecified: Secondary | ICD-10-CM

## 2013-09-02 DIAGNOSIS — Z7982 Long term (current) use of aspirin: Secondary | ICD-10-CM

## 2013-09-02 DIAGNOSIS — I10 Essential (primary) hypertension: Secondary | ICD-10-CM

## 2013-09-02 DIAGNOSIS — M542 Cervicalgia: Secondary | ICD-10-CM

## 2013-09-02 DIAGNOSIS — M12811 Other specific arthropathies, not elsewhere classified, right shoulder: Secondary | ICD-10-CM

## 2013-09-02 DIAGNOSIS — J309 Allergic rhinitis, unspecified: Secondary | ICD-10-CM

## 2013-09-02 DIAGNOSIS — Z87891 Personal history of nicotine dependence: Secondary | ICD-10-CM

## 2013-09-02 LAB — CBC
HCT: 36.6 % — ABNORMAL LOW (ref 39.0–52.0)
HEMOGLOBIN: 12.2 g/dL — AB (ref 13.0–17.0)
MCH: 30.7 pg (ref 26.0–34.0)
MCHC: 33.3 g/dL (ref 30.0–36.0)
MCV: 92 fL (ref 78.0–100.0)
PLATELETS: 162 10*3/uL (ref 150–400)
RBC: 3.98 MIL/uL — AB (ref 4.22–5.81)
RDW: 13.6 % (ref 11.5–15.5)
WBC: 6 10*3/uL (ref 4.0–10.5)

## 2013-09-02 LAB — BASIC METABOLIC PANEL
BUN: 16 mg/dL (ref 6–23)
CALCIUM: 8.7 mg/dL (ref 8.4–10.5)
CHLORIDE: 104 meq/L (ref 96–112)
CO2: 23 meq/L (ref 19–32)
Creatinine, Ser: 0.86 mg/dL (ref 0.50–1.35)
GFR calc Af Amer: >90 mL/min (ref >90)
GFR calc non Af Amer: >90 mL/min (ref >90)
GLUCOSE: 133 mg/dL — AB (ref 70–99)
Potassium: 3.9 mEq/L (ref 3.7–5.3)
Sodium: 138 mEq/L (ref 137–147)

## 2013-09-02 LAB — CSF CELL COUNT WITH DIFFERENTIAL
RBC Count, CSF: 26 /mm3 — ABNORMAL HIGH
RBC Count, CSF: 303 /mm3 — ABNORMAL HIGH
Tube #: 1
Tube #: 4
WBC CSF: 1 /mm3 (ref 0–5)
WBC, CSF: 1 /mm3 (ref 0–5)

## 2013-09-02 LAB — URINALYSIS, ROUTINE W REFLEX MICROSCOPIC
Bilirubin Urine: NEGATIVE
Glucose, UA: NEGATIVE mg/dL
HGB URINE DIPSTICK: NEGATIVE
Ketones, ur: NEGATIVE mg/dL
Nitrite: NEGATIVE
PH: 5.5 (ref 5.0–8.0)
Protein, ur: NEGATIVE mg/dL
SPECIFIC GRAVITY, URINE: 1.023 (ref 1.005–1.030)
Urobilinogen, UA: 1 mg/dL (ref 0.0–1.0)

## 2013-09-02 LAB — GRAM STAIN: Gram Stain: NONE SEEN

## 2013-09-02 LAB — URINE MICROSCOPIC-ADD ON

## 2013-09-02 LAB — I-STAT CG4 LACTIC ACID, ED: LACTIC ACID, VENOUS: 1.32 mmol/L (ref 0.5–2.2)

## 2013-09-02 LAB — PROTEIN, CSF: TOTAL PROTEIN, CSF: 37 mg/dL (ref 15–45)

## 2013-09-02 LAB — GLUCOSE, CSF: GLUCOSE CSF: 75 mg/dL (ref 43–76)

## 2013-09-02 LAB — I-STAT TROPONIN, ED: TROPONIN I, POC: 0 ng/mL (ref 0.00–0.08)

## 2013-09-02 LAB — RAPID STREP SCREEN (MED CTR MEBANE ONLY): STREPTOCOCCUS, GROUP A SCREEN (DIRECT): NEGATIVE

## 2013-09-02 MED ORDER — DEXTROSE 5 % IV SOLN
2.0000 g | Freq: Once | INTRAVENOUS | Status: AC
  Administered 2013-09-02: 2 g via INTRAVENOUS
  Filled 2013-09-02: qty 2

## 2013-09-02 MED ORDER — MORPHINE SULFATE 4 MG/ML IJ SOLN
4.0000 mg | Freq: Once | INTRAMUSCULAR | Status: AC
  Administered 2013-09-02: 4 mg via INTRAVENOUS
  Filled 2013-09-02: qty 1

## 2013-09-02 MED ORDER — SODIUM CHLORIDE 0.9 % IV BOLUS (SEPSIS)
1000.0000 mL | Freq: Once | INTRAVENOUS | Status: AC
  Administered 2013-09-02: 1000 mL via INTRAVENOUS

## 2013-09-02 MED ORDER — IOHEXOL 300 MG/ML  SOLN
100.0000 mL | Freq: Once | INTRAMUSCULAR | Status: AC | PRN
  Administered 2013-09-02: 100 mL via INTRAVENOUS

## 2013-09-02 MED ORDER — VANCOMYCIN HCL 10 G IV SOLR
2000.0000 mg | Freq: Once | INTRAVENOUS | Status: AC
  Administered 2013-09-02: 2000 mg via INTRAVENOUS
  Filled 2013-09-02: qty 2000

## 2013-09-02 MED ORDER — LIDOCAINE HCL 1 % IJ SOLN
INTRAMUSCULAR | Status: AC
  Administered 2013-09-02: 20 mL
  Filled 2013-09-02: qty 20

## 2013-09-02 NOTE — ED Notes (Signed)
Patient transported to CT 

## 2013-09-02 NOTE — ED Notes (Signed)
Dr. Regenia Skeeter performing LP at this time.

## 2013-09-02 NOTE — ED Notes (Signed)
Patient transported to X-ray 

## 2013-09-02 NOTE — ED Notes (Addendum)
Pt denies any recent known fever at home, denies HA, weakness or blurred vision. Per EMS - pt w/ temp of 100.8 and pt given 1073m of tylenol en route.

## 2013-09-02 NOTE — ED Notes (Signed)
Per Dr. Regenia Skeeter - holding antibiotics until LP is completed by radiology.

## 2013-09-02 NOTE — ED Provider Notes (Signed)
CSN: [id number]     Arrival date & time 09/02/13  1503 History   First MD Initiated Contact with Patient 09/02/13 1504     Chief Complaint  Patient presents with  . Back Pain     (Consider location/radiation/quality/duration/timing/severity/associated sxs/prior Treatment) HPI Comments: Pt is a 63 y/o male with a PMHx of chronic back pain, CAD, DJD, anxiety and depression who presents to the ED via EMS complaining of worsening neck pain x 2 days. States he has chronic neck and back pain and recently has been referred to pain management, takes oxycodone 30 mg, ran out of pain meds yesterday. However, he states 2 days ago while sitting and watching TV his neck pain worsened and feels like a pain he has never had before. Pain has been constant, radiating across his neck, worse with any movement and feels stiff. States his pain is normally worse in his lower back. Admits to occasional tingling into his arms, however how severe. States he has felt warm but unsure of fever. He felt dizzy earlier today. Denies headache, chest pain, sob, n/v, loss of control of bowels or bladder or saddle anesthesia. Denies history of IV drug abuse or CA. No known injury or trauma. EMS noted pt to have a fever on arrival, gave tylenol about 10 minutes PTA. Temp 100.2 on arrival.  Patient is a 63 y.o. male presenting with back pain. The history is provided by the patient.  Back Pain   Past Medical History  Diagnosis Date  . Impaired glucose tolerance 09/27/2010  . HYPERLIPIDEMIA-MIXED 07/15/2009  . Chronic pain syndrome 10/27/2009  . HYPERTENSION 06/24/2009  . CORONARY ARTERY DISEASE 06/24/2009    a. s/p multiple PCIs - In 2008 he had a Taxus DES to the mild LAD, Endeavor DES to mid LCX and distal LCX. In January 2009 he had DES to distal LCX, mid LCX and proximal LCX. In November 2009 had BMS x 2 to the mid RCA. Cath 10/2011 with patent stents, noncardiac CP. LHC 01/2013: patent stents (noncardiac CP).  [name] URETHRAL STRICTURE  06/24/2009  . DEGENERATIVE JOINT DISEASE 06/24/2009  . SHOULDER PAIN, BILATERAL 06/24/2009  . ANKLE PAIN, RIGHT 06/24/2009  . [place] DISEASE, LUMBAR 04/19/2010  . SCIATICA, LEFT 04/19/2010  . NEPHROLITHIASIS, HX OF 06/24/2009  . ERECTILE DYSFUNCTION, ORGANIC 05/30/2010  . OSA (obstructive sleep apnea)     not using CPAP very often  . Obesity   . Allergy to clopidogrel   . ALLERGIC RHINITIS 06/24/2009  . Anxiety   . Depression     Prior suicide attempt   Past Surgical History  Procedure Laterality Date  . Tonsillectomy    . Rotator cuff repair      bilateral  . S/p knee replacement  2008  . S/p bilat wrist surgury    . S/p urethral dilations,  multi 2009,2010  . Carpal tunnel release Bilateral   . S/p right knee arthroscopy  03/2005    and multi prior  . S/p umbilical hernia    . S/p left knee arthroscopy  june 2002    multi prior  . S/p right shoulder rotater cuff surgury  april 2011    Dr. [name] Lighter  . Coronary angioplasty with stent placement     Family History  Problem Relation Age of Onset  . Depression Mother   . Heart disease Mother   . Hypertension Mother   . Cancer Mother     Breast  . Diabetes Father   . Heart disease Father  CABG  . Cancer Father     prostate and skin cancer  . Hypertension Father   . Hyperlipidemia Father   . Depression Brother     x 2  . Hypertension Brother     x2  . Coronary artery disease Other   . Hypertension Other   . Depression Other   . Heart disease Maternal Grandfather   . Early death Maternal Grandfather 96    heart attack  . Early death Paternal Grandfather    History  Substance Use Topics  . Smoking status: Former Smoker    Types: Cigars    Quit date: 08/28/2010  . Smokeless tobacco: Never Used     Comment: rare  . Alcohol Use: 0.0 oz/week     Comment: occasional (4 beers all summer)    Review of Systems  Musculoskeletal: Positive for back pain, neck pain and neck stiffness.  Neurological: Positive for dizziness.   All other systems reviewed and are negative.     Allergies  Review of patient's allergies indicates no known allergies.  Home Medications   Prior to Admission medications   Medication Sig Start Date End Date Taking? Authorizing Provider  amLODipine (NORVASC) 5 MG tablet Take 5 mg by mouth daily.    Historical Provider, MD  ARIPiprazole (ABILIFY) 5 MG tablet Take 1 tablet (5 mg total) by mouth daily. 08/13/13   Kathlee Nations, MD  aspirin 81 MG tablet Take 1 tablet (81 mg total) by mouth daily. 06/28/12   Waylan Boga, NP  clonazePAM (KLONOPIN) 2 MG tablet TAKE 1 TABLET BY MOUTH TWICE DAILY AS NEEDED FOR ANXIETY 07/03/13   [name], MD  cyclobenzaprine (FLEXERIL) 5 MG tablet TAKE 1 TABLET BY MOUTH THREE TIMES DAILY AS NEEDED MUSCLE SPASMS 06/30/13   [name], MD  divalproex (DEPAKOTE) 250 MG DR tablet TAKE 1 TABLET BY MOUTH EVERY MORNING AND 2 AT BEDTIME 04/12/13   Kathlee Nations, MD  EFFIENT 10 MG TABS tablet TAKE 1 TABLET BY MOUTH DAILY 05/05/13   Larey Dresser, MD  FLUoxetine (PROZAC) 40 MG capsule Take 1 capsule (40 mg total) by mouth daily. 08/13/13   Kathlee Nations, MD  fluticasone (FLONASE) 50 MCG/ACT nasal spray PLACE 1 SPRAY INTO EACH NOSTRIL DAILY 03/11/13   [name], MD  gabapentin (NEURONTIN) 400 MG capsule Take 800 mg by mouth 4 (four) times daily. 06/28/12   Waylan Boga, NP  indomethacin (INDOCIN) 25 MG capsule Take 50 mg by mouth 2 (two) times daily with a meal.  03/12/13   Historical Provider, MD  isosorbide mononitrate (IMDUR) 60 MG 24 hr tablet TAKE 1 AND 1/2 TABLETS BY MOUTH DAILY 08/14/13   Larey Dresser, MD  metFORMIN (GLUCOPHAGE) 500 MG tablet TAKE 1 TABLET BY MOUTH TWICE DAILY WITH MEALS 04/24/13   [name], MD  oxyCODONE (ROXICODONE) 15 MG immediate release tablet Take 1 tablet (15 mg total) by mouth every 6 (six) hours as needed for pain. With limit 4 per day - to fill Jun 21, 2013 04/16/13   [name], MD  sildenafil (VIAGRA) 50 MG tablet Take 1 tablet  (50 mg total) by mouth daily as needed for erectile dysfunction. 08/19/13   Larey Dresser, MD  traZODone (DESYREL) 100 MG tablet Take 1 tablet (100 mg total) by mouth at bedtime. 08/13/13   Kathlee Nations, MD   BP 98/53  Pulse 82  Temp(Src) 100.2 F (37.9 C) (Oral)  Resp 20  Ht 6' 1"  (1.854 m)  Wt 280 lb (127.007 kg)  BMI 36.95 kg/m2  SpO2 95% Physical Exam  Nursing note and vitals reviewed. Constitutional: He is oriented to person, place, and time. He appears well-developed and well-nourished. No distress.  Obese.  HENT:  Head: Normocephalic and atraumatic.  Mouth/Throat: Oropharynx is clear and moist.  Eyes: Conjunctivae are normal.  Neck: No spinous process tenderness and no muscular tenderness present.  Lateral ROM limited due to pain. Spinous process and paraspinal muscle tenderness. No edema. Severe pain with flexion.  Cardiovascular: Normal rate, regular rhythm and normal heart sounds.   Pulmonary/Chest: Effort normal and breath sounds normal. No respiratory distress.  Abdominal: Soft. Bowel sounds are normal. There is no tenderness.  Musculoskeletal: Normal range of motion. He exhibits no edema.  Neurological: He is alert and oriented to person, place, and time. He has normal strength.  Strength lower extremities 5/5 and equal bilateral. Strength UE 5/5 and equal BL. Pain to neck while assessing. Sensation intact.  Skin: Skin is warm and dry. No rash noted. He is not diaphoretic.  Psychiatric: He has a normal mood and affect. His behavior is normal.    ED Course  Procedures (including critical care time) Labs Review Labs Reviewed  URINALYSIS, ROUTINE W REFLEX MICROSCOPIC - Abnormal; Notable for the following:    Color, Urine AMBER (*)    Leukocytes, UA SMALL (*)    All other components within normal limits  CBC - Abnormal; Notable for the following:    RBC 3.98 (*)    Hemoglobin 12.2 (*)    HCT 36.6 (*)    All other components within normal limits  BASIC  METABOLIC PANEL - Abnormal; Notable for the following:    Glucose, Bld 133 (*)    All other components within normal limits  CSF CELL COUNT WITH DIFFERENTIAL - Abnormal; Notable for the following:    Appearance, CSF CLEAR (*)    RBC Count, CSF 303 (*)    All other components within normal limits  CSF CELL COUNT WITH DIFFERENTIAL - Abnormal; Notable for the following:    Appearance, CSF CLEAR (*)    RBC Count, CSF 26 (*)    All other components within normal limits  GRAM STAIN  RAPID STREP SCREEN  CULTURE, BLOOD (ROUTINE X 2)  CULTURE, BLOOD (ROUTINE X 2)  CSF CULTURE  CULTURE, GROUP A STREP  GLUCOSE, CSF  PROTEIN, CSF  URINE MICROSCOPIC-ADD ON  I-STAT CG4 LACTIC ACID, ED  Randolm Idol, ED    Imaging Review Dg Chest 2 View  09/02/2013   CLINICAL DATA:  Chest pain  EXAM: CHEST  2 VIEW  COMPARISON:  05/03/2013  FINDINGS: Cardiac shadow is stable. The lungs are well aerated bilaterally. Changes of prior coronary stenting are noted. No acute infiltrate or sizable effusion is seen.  IMPRESSION: No acute abnormality noted.   Electronically Signed   By: Inez Catalina M.D.   On: 09/02/2013 15:54   Ct Head Wo Contrast  09/02/2013   CLINICAL DATA:  Head and neck pain.  Unable to walk.  EXAM: CT HEAD WITHOUT CONTRAST  CT CERVICAL SPINE WITHOUT CONTRAST  TECHNIQUE: Multidetector CT imaging of the head and cervical spine was performed following the standard protocol without intravenous contrast. Multiplanar CT image reconstructions of the cervical spine were also generated.  COMPARISON:  MRI 03/21/2013.  MRI 12/03/2009.  FINDINGS: CT HEAD FINDINGS  The brain has a normal appearance without evidence of atrophy, old or acute infarction,  mass lesion, hemorrhage, hydrocephalus or extra-axial collection. No calvarial abnormality. No significant sinus disease.  CT CERVICAL SPINE FINDINGS  Alignment is normal.  No evidence of fracture.  There is ordinary osteoarthritis of the C1-2 articulation.  C2-3:  Facet degeneration on the left. Mild foraminal narrowing on the left.  C3-4: Facet arthropathy right worse than left. Spondylosis with shallow protrusion of disc material and uncovertebral hypertrophy. Foraminal stenosis bilaterally left worse than right.  C4-5: Facet degeneration on the left. No canal or foraminal stenosis.  C5-6: Facet degeneration on the left. 1 mm of anterolisthesis. Mild foraminal narrowing on the left.  C6-7: Spondylosis with endplate osteophytes. Mild narrowing of the canal, not pronounced.  C7-T1: Bilateral facet arthropathy with 2 mm of anterolisthesis. No canal stenosis. Foraminal narrowing left more than right.  IMPRESSION: Head CT:  Negative  Cervical spine CT: Chronic degenerative facet arthropathy and degenerative spondylosis. By CT, there does not appear to be any compromise of the spinal canal. There is foraminal narrowing at multiple levels that could result in cervical radicular symptoms.   Electronically Signed   By: Nelson Chimes M.D.   On: 09/02/2013 15:51   Ct Soft Tissue Neck W Contrast  09/02/2013   CLINICAL DATA:  Fever  EXAM: CT NECK WITH CONTRAST  TECHNIQUE: Multidetector CT imaging of the neck was performed using the standard protocol following the bolus administration of intravenous contrast.  CONTRAST:  144m OMNIPAQUE IOHEXOL 300 MG/ML  SOLN  COMPARISON:  CT HEAD W/O CM dated 09/02/2013  FINDINGS: No retropharyngeal abscess. Multilevel cervical spondylosis and degenerative disc disease. Chronic right maxillary sinusitis.  Mild prominence of the adenoidal tissue and mildly prominent palatine tonsils. Lingual tonsils are prominent, right greater than left.  No pathologic adenopathy in the neck. Salivary glands appear symmetric.  Dominant right vertebral artery. Main vascular structures in the neck are patent.  Thyroid gland unremarkable.  Glottic structures appear symmetric.  IMPRESSION: 1. Slight adenoidal, lingual tonsillar, and palatine tonsillar prominence, likely  reactive. The airway does not appear particularly threatened. No pathologic adenopathy. 2. The lingual tonsillar hypertrophy is somewhat asymmetric, with the right larger than the left. It may be prudent to follow-up with direct visualization of the lingual tonsils in order to ensure resolution of presumably reactive adenopathy and exclude the unlikely possibility of a lingual tonsillar mass. 3. Cervical spondylosis and degenerative disc disease, with osseous foraminal narrowing most striking on the left at C3-4. 4. Chronic right maxillary sinusitis.   Electronically Signed   By: WSherryl BartersM.D.   On: 09/02/2013 21:51   Ct Cervical Spine Wo Contrast  09/02/2013   CLINICAL DATA:  Head and neck pain.  Unable to walk.  EXAM: CT HEAD WITHOUT CONTRAST  CT CERVICAL SPINE WITHOUT CONTRAST  TECHNIQUE: Multidetector CT imaging of the head and cervical spine was performed following the standard protocol without intravenous contrast. Multiplanar CT image reconstructions of the cervical spine were also generated.  COMPARISON:  MRI 03/21/2013.  MRI 12/03/2009.  FINDINGS: CT HEAD FINDINGS  The brain has a normal appearance without evidence of atrophy, old or acute infarction, mass lesion, hemorrhage, hydrocephalus or extra-axial collection. No calvarial abnormality. No significant sinus disease.  CT CERVICAL SPINE FINDINGS  Alignment is normal.  No evidence of fracture.  There is ordinary osteoarthritis of the C1-2 articulation.  C2-3: Facet degeneration on the left. Mild foraminal narrowing on the left.  C3-4: Facet arthropathy right worse than left. Spondylosis with shallow protrusion of disc material and uncovertebral hypertrophy.  Foraminal stenosis bilaterally left worse than right.  C4-5: Facet degeneration on the left. No canal or foraminal stenosis.  C5-6: Facet degeneration on the left. 1 mm of anterolisthesis. Mild foraminal narrowing on the left.  C6-7: Spondylosis with endplate osteophytes. Mild narrowing of  the canal, not pronounced.  C7-T1: Bilateral facet arthropathy with 2 mm of anterolisthesis. No canal stenosis. Foraminal narrowing left more than right.  IMPRESSION: Head CT:  Negative  Cervical spine CT: Chronic degenerative facet arthropathy and degenerative spondylosis. By CT, there does not appear to be any compromise of the spinal canal. There is foraminal narrowing at multiple levels that could result in cervical radicular symptoms.   Electronically Signed   By: Nelson Chimes M.D.   On: 09/02/2013 15:51     EKG Interpretation None      MDM   Final diagnoses:  Neck pain  Fever   Pt presenting with exacerbation of chronic neck pain, however states pain is different than he has ever experienced. He is noted to be febrile and slightly hypotensive. Concern for meningitis. Plan to check labs, CT head/c-spine. If no findings, probable lumbar puncture. Pt also has significant cardiac history, hx of 9 stents. Will check troponin, CXR, EKG in event cardiac cause of neck pain. Case discussed with attending Dr. Regenia Skeeter who agrees with plan of care.  4:18 PM Imaging studies negative. Rectal temp 101.2. Consent for LP given. Prophylactic empiric antibiotics ordered and will be given after LP.  4:54 PM LP attempted by Dr. Regenia Skeeter, unable to obtain CSF, will be done by radiology under fluoroscopy. 8:10 PM CSF Gram stain no organisms seen, white blood cells present, predominantly mononuclear. 1 white blood cell on tube one and four. Culture pending. Doubt meningitis. Plan to obtain CT soft tissue neck with contrast to evaluate any other cause patient's neck pain with fever. He denies sore throat or difficulty swallowing. 11:35 PM CT soft tissue neck showing slight adenoidal lingular tonsillar and palatine tonsillar prominence, likely reactive. This does not correlate with patient's symptoms. Patient is able to swallow without difficulty. When attempting to walk patient, he was unable to get up off  the bed by himself and needed assistance. He feels very unsteady on his feet. He lives at home alone. He is still unable to move his neck. Source of fever still undetermined, possible discitis. Patient may need an MRI for further evaluation. After further discussion with Dr. Regenia Skeeter, pt will be admitted. Admission accepted by Dr. Tana Coast, [facility].  Illene Labrador, PA-C 09/02/13 2338

## 2013-09-02 NOTE — ED Notes (Signed)
Patient transported to radiology for LP

## 2013-09-02 NOTE — H&P (Signed)
History and Physical       Hospital Admission Note Date: 09/02/2013  Patient name: Travis [name] Medical record number: [id number] Date of birth: 12/14/1950 Age: 63 y.o. Gender: male  PCP: [name], MD    Chief Complaint:  Neck and back pain  HPI: Patient is a 63 year old male with history of spinal stenosis, DJD, chronic back pain, states he follows Dr. Louanne Skye, on chronic narcotics presented to the ER with worsening neck pain since Friday 4 days ago. Patient reports that he has history of severe spinal stenosis, start after discussed partner in the office on Friday and was given ESI for the lower back/lumbar spine and no significant improvement. He denies any radiation of the pain to the legs, numbness or tingling or any bladder or bowel incontinence. He states that since Friday he has been having neck pain which is worsening. He ran out of his pain medications yesterday hence it has been progressively getting worse. He also reports that he has been referred to the outpatient pain management. He describes his neck pain as constant, worse with movement of his neck, feels stiff, pain radiating worse the right shoulder but no numbness or tingling or weakness in the arms. He also has a history of right rotator cuff injury. He called EMS, temp was 101.3 which prompted LPN at all to rule out meningitis. He also received IV vancomycin and Rocephin. Although culture pending, Gram stain shows no organism, WBC's 1 CT of the soft tissue neck showed slight edema with lingular tonsil and palatine tonsillar prominence, likely reactive, patient has no difficulty eating.   Review of Systems:  Constitutional: Denies fever, chills, diaphoresis, poor appetite and fatigue.  HEENT: Denies photophobia, eye pain, redness, hearing loss, ear pain, congestion, sore throat, rhinorrhea, sneezing, mouth sores, trouble swallowing, neck pain, neck stiffness and  tinnitus.   Respiratory: Denies SOB, DOE, cough, chest tightness,  and wheezing.   Cardiovascular: Denies chest pain, palpitations and leg swelling.  Gastrointestinal: Denies nausea, vomiting, abdominal pain, diarrhea, constipation, blood in stool and abdominal distention.  Genitourinary: Denies dysuria, urgency, frequency, hematuria, flank pain and difficulty urinating.  Musculoskeletal: Please see history of present illness  Skin: Denies pallor, rash and wound.  Neurological: Denies dizziness, seizures, syncope, weakness, light-headedness, numbness and headaches.  Hematological: Denies adenopathy. Easy bruising, personal or family bleeding history  Psychiatric/Behavioral: Denies suicidal ideation, mood changes, confusion, nervousness, sleep disturbance and agitation  Past Medical History: Past Medical History  Diagnosis Date  . Impaired glucose tolerance 09/27/2010  . HYPERLIPIDEMIA-MIXED 07/15/2009  . Chronic pain syndrome 10/27/2009  . HYPERTENSION 06/24/2009  . CORONARY ARTERY DISEASE 06/24/2009    a. s/p multiple PCIs - In 2008 he had a Taxus DES to the mild LAD, Endeavor DES to mid LCX and distal LCX. In January 2009 he had DES to distal LCX, mid LCX and proximal LCX. In November 2009 had BMS x 2 to the mid RCA. Cath 10/2011 with patent stents, noncardiac CP. LHC 01/2013: patent stents (noncardiac CP).  [name] URETHRAL STRICTURE 06/24/2009  . DEGENERATIVE JOINT DISEASE 06/24/2009  . SHOULDER PAIN, BILATERAL 06/24/2009  . ANKLE PAIN, RIGHT 06/24/2009  . [address] DISEASE, LUMBAR 04/19/2010  . SCIATICA, LEFT 04/19/2010  . NEPHROLITHIASIS, HX OF 06/24/2009  . ERECTILE DYSFUNCTION, ORGANIC 05/30/2010  . OSA (obstructive sleep apnea)     not using CPAP very often  . Obesity   . Allergy to clopidogrel   . ALLERGIC RHINITIS 06/24/2009  . Anxiety   . Depression  Prior suicide attempt   Past Surgical History  Procedure Laterality Date  . Tonsillectomy    . Rotator cuff repair      bilateral  . S/p knee  replacement  2008  . S/p bilat wrist surgury    . S/p urethral dilations,  multi 2009,2010  . Carpal tunnel release Bilateral   . S/p right knee arthroscopy  03/2005    and multi prior  . S/p umbilical hernia    . S/p left knee arthroscopy  june 2002    multi prior  . S/p right shoulder rotater cuff surgury  april 2011    Dr. [name] Lighter  . Coronary angioplasty with stent placement      Medications: Prior to Admission medications   Medication Sig Start Date End Date Taking? Authorizing Provider  amLODipine (NORVASC) 5 MG tablet Take 5 mg by mouth daily.   Yes Historical Provider, MD  ARIPiprazole (ABILIFY) 5 MG tablet Take 5 mg by mouth daily. 08/13/13  Yes Kathlee Nations, MD  aspirin 81 MG tablet Take 1 tablet (81 mg total) by mouth daily. 06/28/12  Yes Waylan Boga, NP  atorvastatin (LIPITOR) 20 MG tablet Take 20 mg by mouth daily.   Yes Historical Provider, MD  benazepril (LOTENSIN) 20 MG tablet Take 20 mg by mouth daily.   Yes Historical Provider, MD  clonazePAM (KLONOPIN) 2 MG tablet Take 2 mg by mouth 2 (two) times daily as needed for anxiety (anxiety).   Yes Historical Provider, MD  cyclobenzaprine (FLEXERIL) 5 MG tablet TAKE 1 TABLET BY MOUTH THREE TIMES DAILY AS NEEDED MUSCLE SPASMS 06/30/13  Yes [name], MD  divalproex (DEPAKOTE) 250 MG DR tablet Take 250-500 mg by mouth 2 (two) times daily. 250 mg in morning and 500 mg at bedtime   Yes Historical Provider, MD  FLUoxetine (PROZAC) 40 MG capsule Take 40 mg by mouth daily. 08/13/13  Yes Kathlee Nations, MD  fluticasone (FLONASE) 50 MCG/ACT nasal spray Place 1 spray into both nostrils daily.   Yes Historical Provider, MD  gabapentin (NEURONTIN) 400 MG capsule Take 800 mg by mouth 4 (four) times daily. 06/28/12  Yes Waylan Boga, NP  isosorbide mononitrate (IMDUR) 60 MG 24 hr tablet Take 90 mg by mouth daily.   Yes Historical Provider, MD  metFORMIN (GLUCOPHAGE) 500 MG tablet Take by mouth 2 (two) times daily with a meal.   Yes Historical  Provider, MD  oxyCODONE (ROXICODONE) 15 MG immediate release tablet Take 1 tablet (15 mg total) by mouth every 6 (six) hours as needed for pain. With limit 4 per day - to fill Jun 21, 2013 04/16/13  Yes [name], MD  prasugrel (EFFIENT) 10 MG TABS tablet Take 10 mg by mouth daily.   Yes Historical Provider, MD  sildenafil (VIAGRA) 50 MG tablet Take 50 mg by mouth daily as needed for erectile dysfunction. 08/19/13  Yes Larey Dresser, MD  traZODone (DESYREL) 100 MG tablet Take 100 mg by mouth at bedtime. 08/13/13  Yes Kathlee Nations, MD  indomethacin (INDOCIN) 25 MG capsule Take 50 mg by mouth 2 (two) times daily with a meal.  03/12/13   Historical Provider, MD    Allergies:  No Known Allergies  Social History:  reports that he quit smoking about 3 years ago. His smoking use included Cigars. He has never used smokeless tobacco. He reports that he drinks alcohol. He reports that he does not use illicit drugs.  Family History: Family History  Problem Relation Age of Onset  . Depression Mother   . Heart disease Mother   . Hypertension Mother   . Cancer Mother     Breast  . Diabetes Father   . Heart disease Father     CABG  . Cancer Father     prostate and skin cancer  . Hypertension Father   . Hyperlipidemia Father   . Depression Brother     x 2  . Hypertension Brother     x2  . Coronary artery disease Other   . Hypertension Other   . Depression Other   . Heart disease Maternal Grandfather   . Early death Maternal Grandfather 31    heart attack  . Early death Paternal Grandfather     Physical Exam: Blood pressure 145/65, pulse 81, temperature 98.6 F (37 C), temperature source Oral, resp. rate 20, height 6' 1"  (1.854 m), weight 127.007 kg (280 lb), SpO2 99.00%. General: Alert, awake, oriented x3, in no acute distress. HEENT: normocephalic, atraumatic, anicteric sclera, pink conjunctiva, pupils equal and reactive to light and accomodation, oropharynx clear Neck: supple, no  masses or lymphadenopathy, no goiter, no bruits  Heart: Regular rate and rhythm, without murmurs, rubs or gallops. Lungs: Clear to auscultation bilaterally, no wheezing, rales or rhonchi. Abdomen: Soft, nontender, nondistended, positive bowel sounds, no masses. Extremities: No clubbing, cyanosis or edema with positive pedal pulses. Back: Cervical spine tenderness, and L 3-4, 4-5 tenderness. Patient was able to sit up in the bed himself. Neuro: Grossly intact, no focal neurological deficits, strength 5/5 upper and lower extremities bilaterally Psych: alert and oriented x 3, normal mood and affect Skin: no rashes or lesions, warm and dry   LABS on Admission:  Basic Metabolic Panel:  Recent Labs Lab 09/02/13 1523  NA 138  K 3.9  CL 104  CO2 23  GLUCOSE 133*  BUN 16  CREATININE 0.86  CALCIUM 8.7   Liver Function Tests: No results found for this basename: AST, ALT, ALKPHOS, BILITOT, PROT, ALBUMIN,  in the last 168 hours No results found for this basename: LIPASE, AMYLASE,  in the last 168 hours No results found for this basename: AMMONIA,  in the last 168 hours CBC:  Recent Labs Lab 09/02/13 1523  WBC 6.0  HGB 12.2*  HCT 36.6*  MCV 92.0  PLT 162   Cardiac Enzymes: No results found for this basename: CKTOTAL, CKMB, CKMBINDEX, TROPONINI,  in the last 168 hours BNP: No components found with this basename: POCBNP,  CBG: No results found for this basename: GLUCAP,  in the last 168 hours   Radiological Exams on Admission: Dg Chest 2 View  09/02/2013   CLINICAL DATA:  Chest pain  EXAM: CHEST  2 VIEW  COMPARISON:  05/03/2013  FINDINGS: Cardiac shadow is stable. The lungs are well aerated bilaterally. Changes of prior coronary stenting are noted. No acute infiltrate or sizable effusion is seen.  IMPRESSION: No acute abnormality noted.   Electronically Signed   By: Inez Catalina M.D.   On: 09/02/2013 15:54   Ct Head Wo Contrast  09/02/2013   CLINICAL DATA:  Head and neck pain.   Unable to walk.  EXAM: CT HEAD WITHOUT CONTRAST  CT CERVICAL SPINE WITHOUT CONTRAST  TECHNIQUE: Multidetector CT imaging of the head and cervical spine was performed following the standard protocol without intravenous contrast. Multiplanar CT image reconstructions of the cervical spine were also generated.  COMPARISON:  MRI 03/21/2013.  MRI 12/03/2009.  FINDINGS: CT HEAD FINDINGS  The brain has a normal appearance without evidence of atrophy, old or acute infarction, mass lesion, hemorrhage, hydrocephalus or extra-axial collection. No calvarial abnormality. No significant sinus disease.  CT CERVICAL SPINE FINDINGS  Alignment is normal.  No evidence of fracture.  There is ordinary osteoarthritis of the C1-2 articulation.  C2-3: Facet degeneration on the left. Mild foraminal narrowing on the left.  C3-4: Facet arthropathy right worse than left. Spondylosis with shallow protrusion of disc material and uncovertebral hypertrophy. Foraminal stenosis bilaterally left worse than right.  C4-5: Facet degeneration on the left. No canal or foraminal stenosis.  C5-6: Facet degeneration on the left. 1 mm of anterolisthesis. Mild foraminal narrowing on the left.  C6-7: Spondylosis with endplate osteophytes. Mild narrowing of the canal, not pronounced.  C7-T1: Bilateral facet arthropathy with 2 mm of anterolisthesis. No canal stenosis. Foraminal narrowing left more than right.  IMPRESSION: Head CT:  Negative  Cervical spine CT: Chronic degenerative facet arthropathy and degenerative spondylosis. By CT, there does not appear to be any compromise of the spinal canal. There is foraminal narrowing at multiple levels that could result in cervical radicular symptoms.   Electronically Signed   By: Nelson Chimes M.D.   On: 09/02/2013 15:51   Ct Soft Tissue Neck W Contrast  09/02/2013   CLINICAL DATA:  Fever  EXAM: CT NECK WITH CONTRAST  TECHNIQUE: Multidetector CT imaging of the neck was performed using the standard protocol following  the bolus administration of intravenous contrast.  CONTRAST:  154m OMNIPAQUE IOHEXOL 300 MG/ML  SOLN  COMPARISON:  CT HEAD W/O CM dated 09/02/2013  FINDINGS: No retropharyngeal abscess. Multilevel cervical spondylosis and degenerative disc disease. Chronic right maxillary sinusitis.  Mild prominence of the adenoidal tissue and mildly prominent palatine tonsils. Lingual tonsils are prominent, right greater than left.  No pathologic adenopathy in the neck. Salivary glands appear symmetric.  Dominant right vertebral artery. Main vascular structures in the neck are patent.  Thyroid gland unremarkable.  Glottic structures appear symmetric.  IMPRESSION: 1. Slight adenoidal, lingual tonsillar, and palatine tonsillar prominence, likely reactive. The airway does not appear particularly threatened. No pathologic adenopathy. 2. The lingual tonsillar hypertrophy is somewhat asymmetric, with the right larger than the left. It may be prudent to follow-up with direct visualization of the lingual tonsils in order to ensure resolution of presumably reactive adenopathy and exclude the unlikely possibility of a lingual tonsillar mass. 3. Cervical spondylosis and degenerative disc disease, with osseous foraminal narrowing most striking on the left at C3-4. 4. Chronic right maxillary sinusitis.   Electronically Signed   By: WSherryl BartersM.D.   On: 09/02/2013 21:51   Ct Cervical Spine Wo Contrast  09/02/2013   CLINICAL DATA:  Head and neck pain.  Unable to walk.  EXAM: CT HEAD WITHOUT CONTRAST  CT CERVICAL SPINE WITHOUT CONTRAST  TECHNIQUE: Multidetector CT imaging of the head and cervical spine was performed following the standard protocol without intravenous contrast. Multiplanar CT image reconstructions of the cervical spine were also generated.  COMPARISON:  MRI 03/21/2013.  MRI 12/03/2009.  FINDINGS: CT HEAD FINDINGS  The brain has a normal appearance without evidence of atrophy, old or acute infarction, mass lesion,  hemorrhage, hydrocephalus or extra-axial collection. No calvarial abnormality. No significant sinus disease.  CT CERVICAL SPINE FINDINGS  Alignment is normal.  No evidence of fracture.  There is ordinary osteoarthritis of the C1-2 articulation.  C2-3: Facet degeneration on the left. Mild foraminal narrowing on the left.  C3-4: Facet arthropathy  right worse than left. Spondylosis with shallow protrusion of disc material and uncovertebral hypertrophy. Foraminal stenosis bilaterally left worse than right.  C4-5: Facet degeneration on the left. No canal or foraminal stenosis.  C5-6: Facet degeneration on the left. 1 mm of anterolisthesis. Mild foraminal narrowing on the left.  C6-7: Spondylosis with endplate osteophytes. Mild narrowing of the canal, not pronounced.  C7-T1: Bilateral facet arthropathy with 2 mm of anterolisthesis. No canal stenosis. Foraminal narrowing left more than right.  IMPRESSION: Head CT:  Negative  Cervical spine CT: Chronic degenerative facet arthropathy and degenerative spondylosis. By CT, there does not appear to be any compromise of the spinal canal. There is foraminal narrowing at multiple levels that could result in cervical radicular symptoms.   Electronically Signed   By: Nelson Chimes M.D.   On: 09/02/2013 15:51    Assessment/Plan Principal Problem:   Neck pain, acute with a history of DJD, spinal stenosis - Patient has run out of his pain medication yesterday and hence his pain was progressively getting worse. He had LP done and spinal studies not consistent with any meningitis. He has received IV vancomycin and Rocephin. - Will obtain MRI of the C-spine and lumbar spine. He received ESI injection by orthopedics on Friday. Per patient, his orthopedics, Dr. Louanne Skye had mentioned that he will need lumbar surgery. Pending MRI of the cervical spine, L-spine, will consult orthopedic spine surgery. - For now place on pain control, his home medication of oxycodone 15 mg q. 6 hours prn,  Dilaudid IV for pain control. If pain management is in adequate, he will benefit from long-acting OxyContin. Place on Neurontin and flexeril. Will give one dose of IV Solu-Medrol, placed on prednisone.  Active Problems:   Chronic pain syndrome - Patient reports that he is being referred to outpatient pain management. Continue pain management for now with oxycodone as needed and IV Dilaudid. -  also placed on Neurontin and Flexeril     Type II or unspecified type diabetes mellitus without mention of complication, uncontrolled - Placed on sliding scale insulin     Lumbar degenerative disc disease With chronic low back pain  -As #1, Pain control as mentioned above, outpatient pain management     Rotator cuff tear arthropathy of right shoulder - Continue pain control   Fever: Unclear etiology  - Lumbar puncture done, cultures pending, received IV vancomycin and ceftriaxone, so studies negative for bacterial meningitis. UA shows small leukocytes. Chest x-ray shows no acute cardio pulmonary disease  - Obtain ESR, CRP, blood cultures  History of bipolar disorder with depression and mania: Follow as outpatient psychiatry, has a history of polypharmacy, sees therapist, Dr. Cheryln Manly - Continue Abilify, Klonopin Depakote, Prozac, trazodone  History of coronary artery disease: Currently no chest pain or ischemic symptoms. - Continue aspirin, effient and statin. - BP is somewhat soft, was 98/53 at the time of arrival. Restart antihypertensives as BP allows in a.m.  DVT prophylaxis:  heparin subcutaneous   CODE STATUS:  DO NOT RESUSCITATE per patient's wishes  Family Communication: Admission, patients condition and plan of care including tests being ordered have been discussed with the patient  who indicates understanding and agree with the plan and Code Status   Further plan will depend as patient's clinical course evolves and further radiologic and laboratory data become available.   Time  Spent on Admission: 1 hour  Gianmarco Roye Krystal Eaton M.D. [facility] 09/02/2013, 11:59 PM Pager: [phone]  If 7PM-7AM, please contact night-coverage [email or web address] Password  [email or web address]  **Disclaimer: This note was dictated with voice recognition software. Similar sounding words can inadvertently be transcribed and this note may contain transcription errors which may not have been corrected upon publication of note.**

## 2013-09-02 NOTE — ED Notes (Signed)
Per [facility] - pt from home, pt w/ hx of spinal stenosis and DDD - pt w/ chronic back pain to lower back and lower neck area - pt ran out of pain medication yesterday, pt progressively worse. Pt in process of getting MRI w/ PCP.

## 2013-09-03 ENCOUNTER — Inpatient Hospital Stay (HOSPITAL_COMMUNITY): Payer: Medicare HMO

## 2013-09-03 ENCOUNTER — Telehealth: Payer: Self-pay | Admitting: Internal Medicine

## 2013-09-03 LAB — BASIC METABOLIC PANEL
BUN: 12 mg/dL (ref 6–23)
CHLORIDE: 103 meq/L (ref 96–112)
CO2: 26 meq/L (ref 19–32)
Calcium: 8.4 mg/dL (ref 8.4–10.5)
Creatinine, Ser: 0.76 mg/dL (ref 0.50–1.35)
GFR calc Af Amer: >90 mL/min (ref >90)
GFR calc non Af Amer: >90 mL/min (ref >90)
Glucose, Bld: 124 mg/dL — ABNORMAL HIGH (ref 70–99)
POTASSIUM: 4 meq/L (ref 3.7–5.3)
Sodium: 140 mEq/L (ref 137–147)

## 2013-09-03 LAB — CBC
HEMATOCRIT: 34.2 % — AB (ref 39.0–52.0)
HEMOGLOBIN: 12.1 g/dL — AB (ref 13.0–17.0)
MCH: 31.9 pg (ref 26.0–34.0)
MCHC: 35.4 g/dL (ref 30.0–36.0)
MCV: 90.2 fL (ref 78.0–100.0)
Platelets: 165 10*3/uL (ref 150–400)
RBC: 3.79 MIL/uL — ABNORMAL LOW (ref 4.22–5.81)
RDW: 13.6 % (ref 11.5–15.5)
WBC: 5.6 10*3/uL (ref 4.0–10.5)

## 2013-09-03 LAB — GLUCOSE, CAPILLARY
GLUCOSE-CAPILLARY: 156 mg/dL — AB (ref 70–99)
GLUCOSE-CAPILLARY: 195 mg/dL — AB (ref 70–99)
Glucose-Capillary: 116 mg/dL — ABNORMAL HIGH (ref 70–99)
Glucose-Capillary: 147 mg/dL — ABNORMAL HIGH (ref 70–99)
Glucose-Capillary: 157 mg/dL — ABNORMAL HIGH (ref 70–99)

## 2013-09-03 LAB — HEMOGLOBIN A1C
HEMOGLOBIN A1C: 5.3 % (ref <5.7)
MEAN PLASMA GLUCOSE: 105 mg/dL (ref <117)

## 2013-09-03 LAB — C-REACTIVE PROTEIN: CRP: 10.7 mg/dL — ABNORMAL HIGH (ref <0.60)

## 2013-09-03 LAB — SEDIMENTATION RATE: Sed Rate: 20 mm/hr — ABNORMAL HIGH (ref 0–16)

## 2013-09-03 MED ORDER — ACETAMINOPHEN 650 MG RE SUPP: 650.0000 mg | Freq: Four times a day (QID) | RECTAL | Status: DC | PRN

## 2013-09-03 MED ORDER — PRASUGREL HCL 10 MG PO TABS
10.0000 mg | ORAL_TABLET | Freq: Every day | ORAL | Status: DC
  Administered 2013-09-03 – 2013-09-04 (×2): 10 mg via ORAL
  Filled 2013-09-03 (×2): qty 1

## 2013-09-03 MED ORDER — PREDNISONE 5 MG PO TABS: ORAL_TABLET | ORAL | Status: DC

## 2013-09-03 MED ORDER — HYDROMORPHONE HCL PF 1 MG/ML IJ SOLN
1.0000 mg | INTRAMUSCULAR | Status: DC | PRN
  Administered 2013-09-03: 1 mg via INTRAVENOUS
  Administered 2013-09-03: 2 mg via INTRAVENOUS
  Administered 2013-09-03: 1 mg via INTRAVENOUS
  Administered 2013-09-03: 2 mg via INTRAVENOUS
  Administered 2013-09-04: 1 mg via INTRAVENOUS
  Filled 2013-09-03: qty 2
  Filled 2013-09-03 (×2): qty 1
  Filled 2013-09-03: qty 2
  Filled 2013-09-03: qty 1

## 2013-09-03 MED ORDER — ACETAMINOPHEN 325 MG PO TABS: 650.0000 mg | ORAL_TABLET | Freq: Four times a day (QID) | ORAL | Status: DC | PRN

## 2013-09-03 MED ORDER — PREDNISONE 50 MG PO TABS
60.0000 mg | ORAL_TABLET | Freq: Every day | ORAL | Status: DC
  Administered 2013-09-03 – 2013-09-04 (×2): 60 mg via ORAL
  Filled 2013-09-03 (×3): qty 1

## 2013-09-03 MED ORDER — LORAZEPAM 2 MG/ML IJ SOLN
1.0000 mg | Freq: Once | INTRAMUSCULAR | Status: AC
  Administered 2013-09-03: 1 mg via INTRAVENOUS
  Filled 2013-09-03: qty 1

## 2013-09-03 MED ORDER — SODIUM CHLORIDE 0.9 % IV SOLN
INTRAVENOUS | Status: DC
  Administered 2013-09-03: 01:00:00 via INTRAVENOUS

## 2013-09-03 MED ORDER — FLUTICASONE PROPIONATE 50 MCG/ACT NA SUSP
1.0000 | Freq: Every day | NASAL | Status: DC
  Administered 2013-09-03 – 2013-09-04 (×2): 1 via NASAL
  Filled 2013-09-03: qty 16

## 2013-09-03 MED ORDER — TRAZODONE HCL 100 MG PO TABS
100.0000 mg | ORAL_TABLET | Freq: Every day | ORAL | Status: DC
  Administered 2013-09-03 (×2): 100 mg via ORAL
  Filled 2013-09-03 (×3): qty 1

## 2013-09-03 MED ORDER — ARIPIPRAZOLE 5 MG PO TABS
5.0000 mg | ORAL_TABLET | Freq: Every day | ORAL | Status: DC
  Administered 2013-09-03 – 2013-09-04 (×2): 5 mg via ORAL
  Filled 2013-09-03 (×2): qty 1

## 2013-09-03 MED ORDER — INSULIN ASPART 100 UNIT/ML ~~LOC~~ SOLN: 0.0000 [IU] | Freq: Every day | SUBCUTANEOUS | Status: DC

## 2013-09-03 MED ORDER — ONDANSETRON HCL 4 MG/2ML IJ SOLN: 4.0000 mg | Freq: Four times a day (QID) | INTRAMUSCULAR | Status: DC | PRN

## 2013-09-03 MED ORDER — DIVALPROEX SODIUM 250 MG PO DR TAB
500.0000 mg | DELAYED_RELEASE_TABLET | Freq: Every day | ORAL | Status: DC
  Administered 2013-09-03 (×2): 500 mg via ORAL
  Filled 2013-09-03 (×3): qty 2

## 2013-09-03 MED ORDER — FLUOXETINE HCL 20 MG PO CAPS
40.0000 mg | ORAL_CAPSULE | Freq: Every day | ORAL | Status: DC
  Administered 2013-09-03 – 2013-09-04 (×2): 40 mg via ORAL
  Filled 2013-09-03 (×2): qty 2

## 2013-09-03 MED ORDER — HEPARIN SODIUM (PORCINE) 5000 UNIT/ML IJ SOLN
5000.0000 [IU] | Freq: Three times a day (TID) | INTRAMUSCULAR | Status: DC
  Administered 2013-09-03 – 2013-09-04 (×4): 5000 [IU] via SUBCUTANEOUS
  Filled 2013-09-03 (×7): qty 1

## 2013-09-03 MED ORDER — ONDANSETRON HCL 4 MG PO TABS: 4.0000 mg | ORAL_TABLET | Freq: Four times a day (QID) | ORAL | Status: DC | PRN

## 2013-09-03 MED ORDER — METHYLPREDNISOLONE SODIUM SUCC 125 MG IJ SOLR
125.0000 mg | Freq: Once | INTRAMUSCULAR | Status: AC
  Administered 2013-09-03: 125 mg via INTRAVENOUS

## 2013-09-03 MED ORDER — INSULIN ASPART 100 UNIT/ML ~~LOC~~ SOLN
0.0000 [IU] | Freq: Three times a day (TID) | SUBCUTANEOUS | Status: DC
  Administered 2013-09-03: 1 [IU] via SUBCUTANEOUS
  Administered 2013-09-03 (×2): 2 [IU] via SUBCUTANEOUS
  Administered 2013-09-04 (×2): 1 [IU] via SUBCUTANEOUS

## 2013-09-03 MED ORDER — OXYCODONE HCL 5 MG PO TABS
15.0000 mg | ORAL_TABLET | Freq: Four times a day (QID) | ORAL | Status: DC | PRN
  Administered 2013-09-03 – 2013-09-04 (×4): 15 mg via ORAL
  Filled 2013-09-03 (×4): qty 3

## 2013-09-03 MED ORDER — ASPIRIN 81 MG PO CHEW
81.0000 mg | CHEWABLE_TABLET | Freq: Every day | ORAL | Status: DC
  Administered 2013-09-03 – 2013-09-04 (×2): 81 mg via ORAL
  Filled 2013-09-03 (×2): qty 1

## 2013-09-03 MED ORDER — CLONAZEPAM 1 MG PO TABS
2.0000 mg | ORAL_TABLET | Freq: Two times a day (BID) | ORAL | Status: DC | PRN
  Administered 2013-09-03: 2 mg via ORAL
  Filled 2013-09-03: qty 2

## 2013-09-03 MED ORDER — DIVALPROEX SODIUM 250 MG PO DR TAB
250.0000 mg | DELAYED_RELEASE_TABLET | Freq: Every day | ORAL | Status: DC
  Administered 2013-09-03 – 2013-09-04 (×2): 250 mg via ORAL
  Filled 2013-09-03 (×2): qty 1

## 2013-09-03 MED ORDER — SILDENAFIL CITRATE 50 MG PO TABS: 50.0000 mg | ORAL_TABLET | Freq: Every day | ORAL | Status: DC | PRN

## 2013-09-03 MED ORDER — SODIUM CHLORIDE 0.9 % IJ SOLN
3.0000 mL | Freq: Two times a day (BID) | INTRAMUSCULAR | Status: DC
  Administered 2013-09-03 – 2013-09-04 (×2): 3 mL via INTRAVENOUS

## 2013-09-03 MED ORDER — OXYCODONE HCL 15 MG PO TABS: 15.0000 mg | ORAL_TABLET | Freq: Four times a day (QID) | ORAL | Status: DC | PRN

## 2013-09-03 MED ORDER — GABAPENTIN 400 MG PO CAPS
800.0000 mg | ORAL_CAPSULE | Freq: Four times a day (QID) | ORAL | Status: DC
  Administered 2013-09-03 – 2013-09-04 (×5): 800 mg via ORAL
  Filled 2013-09-03 (×8): qty 2

## 2013-09-03 MED ORDER — ALUM & MAG HYDROXIDE-SIMETH 200-200-20 MG/5ML PO SUSP: 30.0000 mL | Freq: Four times a day (QID) | ORAL | Status: DC | PRN

## 2013-09-03 MED ORDER — ATORVASTATIN CALCIUM 20 MG PO TABS
20.0000 mg | ORAL_TABLET | Freq: Every day | ORAL | Status: DC
  Administered 2013-09-03: 20 mg via ORAL
  Filled 2013-09-03 (×2): qty 1

## 2013-09-03 MED ORDER — CYCLOBENZAPRINE HCL 10 MG PO TABS
10.0000 mg | ORAL_TABLET | Freq: Three times a day (TID) | ORAL | Status: DC
  Administered 2013-09-03 – 2013-09-04 (×5): 10 mg via ORAL
  Filled 2013-09-03 (×7): qty 1

## 2013-09-03 NOTE — Telephone Encounter (Signed)
Faxed referral to [facility] @ [phone] for Dr Dian Situ

## 2013-09-03 NOTE — Progress Notes (Signed)
Report received at 54 from [place], [place]. No change in assessment. Will continue to monitor pt closely. Helayne [place]

## 2013-09-03 NOTE — Care Management Note (Addendum)
    Page 1 of 2   09/04/2013     3:53:03 PM   CARE MANAGEMENT NOTE 09/04/2013  Patient:  Travis [name], Travis [name]   Account Number:  [id number]  Date Initiated:  09/03/2013  Documentation initiated by:  Dessa Phi  Subjective/Objective Assessment:   63 Y/O M ADMITTED W/NECK/BACK PAIN.     Action/Plan:   FROM HOME.   Anticipated DC Date:  09/04/2013   Anticipated DC Plan:  [place]  CM consult      Choice offered to / List presented to:  C-1 Patient   DME arranged  [place]      DME agency  [facility]     HH arranged  HH-1 RN  [place] agency  [facility].   Status of service:  Completed, signed off Medicare Important Message given?   (If response is "NO", the following Medicare IM given date fields will be blank) Date Medicare IM given:   Date Additional Medicare IM given:    Discharge Disposition:  [place]  Per UR Regulation:  Reviewed for med. necessity/level of care/duration of stay  If discussed at [place] of Stay Meetings, dates discussed:    Comments:  09/04/13 Roch Quach RN,BSN NCM [phone] 3:50P DUE TO TIME,THE PATIENT IS AGREEABLE FOR DELIVERY OF DME TO HIS HOME,CONFIRMED Apolonio Schneiders [facility] DME REP-ARRANGEMENTS FOR DELIVERY TO HOME.MD/NSG AWARE & AGREE ALSO. INSURANCE CONTRACT FOR DME W/[facility] REP [facility] C#[phone]/FAX#[phone] FAXED W/CONFIRMATION HOME DME ORDERS.THEY HAVE A 4HR WINDOW TO DELIVER TO HOSPITAL.[facility] CHOSEN FOR HH,TC KRISTEN REP AWARE OF D/C,& HHC ORDERS.PATIENT AWARE TO WAIT FOR DELIVERY OF DME TO [facility].

## 2013-09-03 NOTE — Progress Notes (Signed)
OT Cancellation Note  Patient Details Name: Tadeusz Stahl MRN: [id number] DOB: Dec 16, 1950   Cancelled Treatment:    Reason Eval/Treat Not Completed: Patient not medically ready. Noted pt on bedrest for 12 hours, as well as awaiting MRI results. Will recheck on pt next day. Thanks,  Betsy Pries 09/03/2013, 12:02 PM

## 2013-09-03 NOTE — Progress Notes (Signed)
Please see earlier admission note by Dr. Tana Coast, MRI lumbar and cervical spine pending. Follow upon results. Continue analgesia, Pt evaluation one pt able to tolerate. CBC and BMP in AM.  Faye Ramsay, MD  [facility] Pager [phone]  If 7PM-7AM, please contact night-coverage [email or web address] Password [email or web address]

## 2013-09-04 DIAGNOSIS — J309 Allergic rhinitis, unspecified: Secondary | ICD-10-CM

## 2013-09-04 DIAGNOSIS — J069 Acute upper respiratory infection, unspecified: Secondary | ICD-10-CM

## 2013-09-04 LAB — CBC
HCT: 35.8 % — ABNORMAL LOW (ref 39.0–52.0)
Hemoglobin: 12.3 g/dL — ABNORMAL LOW (ref 13.0–17.0)
MCH: 31 pg (ref 26.0–34.0)
MCHC: 34.4 g/dL (ref 30.0–36.0)
MCV: 90.2 fL (ref 78.0–100.0)
PLATELETS: 163 10*3/uL (ref 150–400)
RBC: 3.97 MIL/uL — ABNORMAL LOW (ref 4.22–5.81)
RDW: 13.1 % (ref 11.5–15.5)
WBC: 6.1 10*3/uL (ref 4.0–10.5)

## 2013-09-04 LAB — GLUCOSE, CAPILLARY
Glucose-Capillary: 128 mg/dL — ABNORMAL HIGH (ref 70–99)
Glucose-Capillary: 134 mg/dL — ABNORMAL HIGH (ref 70–99)

## 2013-09-04 LAB — CULTURE, GROUP A STREP

## 2013-09-04 LAB — BASIC METABOLIC PANEL
BUN: 14 mg/dL (ref 6–23)
CHLORIDE: 103 meq/L (ref 96–112)
CO2: 27 mEq/L (ref 19–32)
CREATININE: 0.8 mg/dL (ref 0.50–1.35)
Calcium: 8.6 mg/dL (ref 8.4–10.5)
Glucose, Bld: 148 mg/dL — ABNORMAL HIGH (ref 70–99)
Potassium: 4.5 mEq/L (ref 3.7–5.3)
Sodium: 140 mEq/L (ref 137–147)

## 2013-09-04 NOTE — Evaluation (Signed)
Physical Therapy Evaluation Patient Details Name: Travis [name] MRN: [id number] DOB: Aug 16, 1950 Today's Date: 09/04/2013   History of Present Illness  Neck and back pain. H/O severe degenerative spine disease.  Clinical Impression  Pt appears with improved balance with RW in open spaces, pt may be used to [facility] and object holding within the home as in case of balance loss. Pt will benefit from HHPT and RW. A hospital bed would decrease burden and strain of moving supine to sit and back.    Follow Up Recommendations Home health PT;Supervision - Intermittent    Equipment Recommendations  Rolling walker with 5" wheels    Recommendations for Other Services       Precautions / Restrictions Precautions Precautions: Fall Restrictions Weight Bearing Restrictions: No      Mobility  Bed Mobility Overal bed mobility: Modified Independent             General bed mobility comments: HOB raised, maximal effort using rail to turn and pull self to upright, also use of rail to go back to upright sitting in bed.  Transfers Overall transfer level: Needs assistance Equipment used: Straight cane;Rolling walker (2 wheeled) Transfers: Sit to/from Stand Sit to Stand: Supervision;Modified independent (Device/Increase time)         General transfer comment: pt is tenuaous as far as balance during sit to stand from  all surfaces, uses UE's maximally. holds onto rail beside toilet, slow to rise.  Ambulation/Gait Ambulation/Gait assistance: Supervision Ambulation Distance (Feet):  (x 2) Assistive device: Rolling walker (2 wheeled);Straight cane Gait Pattern/deviations: Step-to pattern;Step-through pattern;Decreased stride length;Decreased weight shift to right;Decreased weight shift to left;Wide base of support   Gait velocity interpretation: Below normal speed for age/gender General Gait Details: pt ambulated x 15' x 2 with SPC and held o bed, door door frame. Ambulated x 40' with Rw with  much better balance but c/o increase in R shoulder pain.  Stairs            Wheelchair Mobility    Modified Rankin (Stroke Patients Only)       Balance Overall balance assessment: Needs assistance         Standing balance support: Single extremity supported Standing balance-Leahy Scale: Poor Standing balance comment: appears to have to "regroup" at times and hold to objects, no righting response, neck and trunk are very rigid. High risk to fall                             Pertinent Vitals/Pain r neck and shoulder. 10- decr. W/ rest    [place] expects to be discharged to:: Private residence Living Arrangements: Alone   Type of Home: Mobile home Home Access: Stairs to enter Entrance Stairs-Rails: Psychiatric nurse of Steps: 5 Home Layout: One level Home Equipment: [place] - single point      Prior Function Level of Independence: Independent with assistive device(s)               Hand Dominance        Extremity/Trunk Assessment   Upper Extremity Assessment: Defer to OT evaluation           Lower Extremity Assessment: Generalized weakness      Cervical / Trunk Assessment: Kyphotic  Communication   Communication: No difficulties  Cognition Arousal/Alertness: Awake/alert Behavior During Therapy: WFL for tasks assessed/performed Overall Cognitive Status: Within Functional Limits for tasks assessed  General Comments      Exercises        Assessment/Plan    PT Assessment All further PT needs can be met in the next venue of care  PT Diagnosis     PT Problem List Decreased balance  PT Treatment Interventions     PT Goals (Current goals can be found in the Care Plan section) Acute Rehab PT Goals Patient Stated Goal: i need some help. (pt is for DC to home) PT Goal Formulation: No goals set, d/c therapy    Frequency     Barriers to discharge         Co-evaluation               End of Session Equipment Utilized During Treatment: Gait belt Activity Tolerance: Patient limited by pain Patient left: in bed;with call bell/phone within reach (OT) Nurse Communication: Mobility status (needs RW)         Time: 1136-1159 PT Time Calculation (min): 23 min   Charges:   PT Evaluation $Initial PT Evaluation Tier I: 1 Procedure PT Treatments $Gait Training: 23-37 mins   PT G Codes:           Elizabeth  09/04/2013, 12:12 PM   PT [phone]    

## 2013-09-04 NOTE — ED Provider Notes (Signed)
Medical screening examination/treatment/procedure(s) were conducted as a shared visit with non-physician practitioner(s) and myself.  I personally evaluated the patient during the encounter.   EKG Interpretation   Date/Time:  Tuesday September 02 2013 15:57:05 EDT Ventricular Rate:  84 PR Interval:  146 QRS Duration: 125 QT Interval:  354 QTC Calculation: 418 R Axis:   14 Text Interpretation:  Sinus rhythm Nonspecific intraventricular conduction  delay Baseline wander in lead(s) V2 No significant change since last  tracing Confirmed by Silvana Holecek  MD, Taegen Lennox ([id number]) on 09/02/2013 4:46:34 PM      patient with worsening neck pain and stiffness in the last few days. He is found to be febrile here as well. No neurologic abnormalities except vague weakness to bilateral extremities. Given this an LP was attempted by me, however no long needles are able to be obtained and the patient is obese making the procedure difficult. Due to this he was given antibiotics and his LP was done by radiology. This is no signs of meningitis or encephalitis. CT of his neck shows no retropharyngeal abscess. There is mild evidence of tonsillitis but this does not fit clinically as he has no sore throat only posterior neck pain. We'll admit to the hospitalist as he is unable to get out of bed without assistance likely needs an MRI to rule out discitis  Ephraim Hamburger, MD 09/04/13 [id number]

## 2013-09-04 NOTE — Telephone Encounter (Signed)
Received authorization # [id number] start date 09/22/13 exp 12/21/13 good for 4 visits

## 2013-09-04 NOTE — Evaluation (Signed)
Occupational Therapy Evaluation Patient Details Name: Travis [name] MRN: [id number] DOB: [birth year] Today's Date: 09/04/2013    History of Present Illness Neck and back pain. H/O severe degenerative spine disease.   Clinical Impression   Pt presents to OT with decreased I with ADL activity. Pt will benefit from skilled OT to increase I with ADL activity in home setting    Follow Up Recommendations  Home health OT    Equipment Recommendations  Tub/shower bench       Precautions / Restrictions Precautions Precautions: Fall Restrictions Weight Bearing Restrictions: No      Mobility Bed Mobility Overal bed mobility: Modified Independent             General bed mobility comments: HOB raised, maximal effort using rail to turn and pull self to upright, also use of rail to go back to upright sitting in bed.  Transfers Overall transfer level: Needs assistance Equipment used: Rolling walker (2 wheeled) Transfers: Sit to/from Stand Sit to Stand: Supervision;Modified independent (Device/Increase time)         General transfer comment: pt is tenuaous as far as balance during sit to stand from  all surfaces, uses UE's maximally. holds onto rail beside toilet, slow to rise.    Balance Overall balance assessment: Needs assistance         Standing balance support: Single extremity supported Standing balance-Leahy Scale: Poor Standing balance comment: appears to have to "regroup" at times and hold to objects, no righting response, neck and trunk are very rigid. High risk to fall                            ADL Overall ADL's : Needs assistance/impaired     Grooming: Set up;Standing   Upper Body Bathing: Set up;Sitting   Lower Body Bathing: Minimal assistance;Sit to/from stand   Upper Body Dressing : Set up;Sitting   Lower Body Dressing: Minimal assistance;Sit to/from stand   Toilet Transfer: Min guard;Comfort height toilet   Toileting- Marine scientist and Hygiene: Min guard;Sit to/from Nurse, children's Details (indicate cue type and reason): pt does want a tub transfer Functional mobility during ADLs: Min guard       Vision                      Extremity/Trunk Assessment Upper Extremity Assessment Upper Extremity Assessment: Generalized weakness   Lower Extremity Assessment Lower Extremity Assessment: Generalized weakness   Cervical / Trunk Assessment Cervical / Trunk Assessment: Kyphotic   Communication Communication Communication: No difficulties   Cognition Arousal/Alertness: Awake/alert Behavior During Therapy: WFL for tasks assessed/performed Overall Cognitive Status: Within Functional Limits for tasks assessed                                Home Living Family/patient expects to be discharged to:: Private residence Living Arrangements: Alone   Type of Home: Mobile home Home Access: Stairs to enter [employer] of Steps: 5 Entrance Stairs-Rails: Right;Left Home Layout: One level     Bathroom Shower/Tub: Tub/shower unit Shower/tub characteristics: Architectural technologist: Standard     Home Equipment: Cane - single point          Prior Functioning/Environment Level of Independence: Independent with assistive device(s)             OT Diagnosis: Generalized weakness   OT Problem  List: Decreased strength;Pain   OT Treatment/Interventions: Self-care/ADL training;DME and/or AE instruction;Patient/family education    OT Goals(Current goals can be found in the care plan section) Acute Rehab OT Goals Patient Stated Goal: i need some help. OT Goal Formulation: With patient Time For Goal Achievement: 09/18/13  OT Frequency: Min 2X/week   Barriers to D/C:               End of Session Equipment Utilized During Treatment: Rolling walker Nurse Communication: Mobility status  Activity Tolerance: Patient tolerated treatment well Patient left:  in bed   Time: 2426-8341 OT Time Calculation (min): 18 min Charges:  OT General Charges $OT Visit: 1 Procedure OT Evaluation $Initial OT Evaluation Tier I: 1 Procedure OT Treatments $Self Care/Home Management : 8-22 mins G-Codes:    Betsy Pries 09-06-2013, 12:17 PM

## 2013-09-04 NOTE — Discharge Instructions (Signed)
Back Pain, Adult Low back pain is very common. About 1 in 5 people have back pain.The cause of low back pain is rarely dangerous. The pain often gets better over time.About half of people with a sudden onset of back pain feel better in just 2 weeks. About 8 in 10 people feel better by 6 weeks.  CAUSES Some common causes of back pain include:  Strain of the muscles or ligaments supporting the spine.  Wear and tear (degeneration) of the spinal discs.  Arthritis.  Direct injury to the back. DIAGNOSIS Most of the time, the direct cause of low back pain is not known.However, back pain can be treated effectively even when the exact cause of the pain is unknown.Answering your caregiver's questions about your overall health and symptoms is one of the most accurate ways to make sure the cause of your pain is not dangerous. If your caregiver needs more information, he or she may order lab work or imaging tests (X-rays or MRIs).However, even if imaging tests show changes in your back, this usually does not require surgery. HOME CARE INSTRUCTIONS For many people, back pain returns.Since low back pain is rarely dangerous, it is often a condition that people can learn to manageon their own.   Remain active. It is stressful on the back to sit or stand in one place. Do not sit, drive, or stand in one place for more than 30 minutes at a time. Take short walks on level surfaces as soon as pain allows.Try to increase the length of time you walk each day.  Do not stay in bed.Resting more than 1 or 2 days can delay your recovery.  Do not avoid exercise or work.Your body is made to move.It is not dangerous to be active, even though your back may hurt.Your back will likely heal faster if you return to being active before your pain is gone.  Pay attention to your body when you bend and lift. Many people have less discomfortwhen lifting if they bend their knees, keep the load close to their bodies,and  avoid twisting. Often, the most comfortable positions are those that put less stress on your recovering back.  Find a comfortable position to sleep. Use a firm mattress and lie on your side with your knees slightly bent. If you lie on your back, put a pillow under your knees.  Only take over-the-counter or prescription medicines as directed by your caregiver. Over-the-counter medicines to reduce pain and inflammation are often the most helpful.Your caregiver may prescribe muscle relaxant drugs.These medicines help dull your pain so you can more quickly return to your normal activities and healthy exercise.  Put ice on the injured area.  Put ice in a plastic bag.  Place a towel between your skin and the bag.  Leave the ice on for 15-20 minutes, 03-04 times a day for the first 2 to 3 days. After that, ice and heat may be alternated to reduce pain and spasms.  Ask your caregiver about trying back exercises and gentle massage. This may be of some benefit.  Avoid feeling anxious or stressed.Stress increases muscle tension and can worsen back pain.It is important to recognize when you are anxious or stressed and learn ways to manage it.Exercise is a great option. SEEK MEDICAL CARE IF:  You have pain that is not relieved with rest or medicine.  You have pain that does not improve in 1 week.  You have new symptoms.  You are generally not feeling well. SEEK   IMMEDIATE MEDICAL CARE IF:   You have pain that radiates from your back into your legs.  You develop new bowel or bladder control problems.  You have unusual weakness or numbness in your arms or legs.  You develop nausea or vomiting.  You develop abdominal pain.  You feel faint. Document Released: 05/08/2005 Document Revised: 11/07/2011 Document Reviewed: 09/26/2010 [employer] Patient Information 2014 [employer].  

## 2013-09-04 NOTE — Discharge Summary (Signed)
Physician Discharge Summary  Travis [name] CXK:[id number] DOB: Apr 06, 1951 DOA: 09/02/2013  PCP: [name], MD  Admit date: 09/02/2013 Discharge date: 09/04/2013  Recommendations for Outpatient Follow-up:  1. Pt will need to follow up with PCP in 2-3 weeks post discharge 2. Please obtain BMP to evaluate electrolytes and kidney function 3. Please also check CBC to evaluate Hg and Hct levels 4. Pt to follow up with Dr. Louanne Skye as needed   Discharge Diagnoses:  Principal Problem:   Neck pain, acute Active Problems:   Chronic pain syndrome   Type II or unspecified type diabetes mellitus without mention of complication, uncontrolled   Lumbar degenerative disc disease   Low back pain   Rotator cuff tear arthropathy of right shoulder   Neck pain    Discharge Condition: Stable  Diet recommendation: Heart healthy diet discussed in details   History of present illness:  63 year old male with history of spinal stenosis, DJD, chronic back pain, states he follows Dr. Louanne Skye, on chronic narcotics presented to the ER with worsening neck pain since Friday 4 days PTA. Patient reports that he has history of severe spinal stenosis. He denies any radiation of the pain to the legs, numbness or tingling or any bladder or bowel incontinence. He states that since Friday he has been having neck pain which is worsening. He ran out of his pain medications yesterday hence it has been progressively getting worse. He also reports that he has been referred to the outpatient pain management. He describes his neck pain as constant, worse with movement of his neck, feels stiff, pain radiating to the right shoulder but no numbness or tingling or weakness in the arms. He also has a history of right rotator cuff injury. He called EMS, temp was 101.3 which prompted LP to rule out meningitis. He also received IV vancomycin and Rocephin. Although culture pending, Gram stain shows no organism. CT of the soft tissue neck showed  slight edema with lingular tonsil and palatine tonsillar prominence, likely reactive, patient has no difficulty eating.   Hospital Course:  Principal Problem:   Neck pain, acute - better controlled with pain medication - PT evaluation requested - pt to follow up with Dr. Louanne Skye for further recommendations  Active Problems:   Chronic pain syndrome - continue analgesia as needed - PT evaluation    Type II or unspecified type diabetes mellitus without mention of complication, uncontrolled - reasonable inpatient control    Lumbar degenerative disc disease - please see MRI results below - follow up with Dr. Louanne Skye   Procedures/Studies: Dg Chest 2 View  09/02/2013  No acute abnormality noted.     Ct Head Wo Contrast  09/02/2013   Head CT:  Negative  Cervical spine CT: Chronic degenerative facet arthropathy and degenerative spondylosis. By CT, there does not appear to be any compromise of the spinal canal. There is foraminal narrowing at multiple levels that could result in cervical radicular symptoms.   Ct Soft Tissue Neck W Contrast  09/02/2013  Slight adenoidal, lingual tonsillar, and palatine tonsillar prominence, likely reactive. The airway does not appear particularly threatened. No pathologic adenopathy. The lingual tonsillar hypertrophy is somewhat asymmetric, with the right larger than the left. It may be prudent to follow-up with direct visualization of the lingual tonsils in order to ensure resolution of presumably reactive adenopathy and exclude the unlikely possibility of a lingual tonsillar mass.  Cervical spondylosis and degenerative disc disease, with osseous foraminal narrowing most striking on the left at C3-4.  4. Chronic right maxillary sinusitis.  Ct Cervical Spine Wo Contrast  09/02/2013   Head CT:  Negative  Cervical spine CT: Chronic degenerative facet arthropathy and degenerative spondylosis. By CT, there does not appear to be any compromise of the spinal canal. There is foraminal  narrowing at multiple levels that could result in cervical radicular symptoms.   Mr Cervical Spine Wo Contrast  09/03/2013  Progressive broad-based disc osteophyte, plexus and left paramedian protrusion at C3-4 results and moderate central canal stenosis with contact and distortion of the left ventral surface of the cord but no definite abnormal signal. Moderate foraminal stenosis bilaterally at C3-4. rogressive uncovertebral and facet spurring at C4-5 with mild to moderate foraminal stenosis bilaterally.  Progressive broad-based disc osteophyte complex with no mild central and right foraminal stenosis at C6-7. Progressive asymmetric right-sided facet hypertrophy at C7-T1 without significant stenosis.    Mr Lumbar Spine Wo Contrast  09/03/2013  No significant interval change. Most severe level is again noted to the L4-5 with moderate central canal stenosis and right greater than left lateral recess narrowing. Moderate foraminal stenosis is worse on the left L4-5.    Dg Lumbar Puncture Fluoro Guide   09/03/2013  Technically successful lumbar puncture under fluoroscopy.    Consultations:  None  Antibiotics:  None upon discharge  Discharge Exam: Filed Vitals:   09/04/13 0501  BP: 144/65  Pulse: 46  Temp: 97.8 F (36.6 C)  Resp: 18   Filed Vitals:   09/03/13 0430 09/03/13 1434 09/03/13 2113 09/04/13 0501  BP: 140/67 152/64 140/85 144/65  Pulse: 62 62 59 46  Temp: 99.3 F (37.4 C) 97.4 F (36.3 C) 98.7 F (37.1 C) 97.8 F (36.6 C)  TempSrc: Oral Oral Oral Oral  Resp: 18 16 16 18   Height:      Weight:      SpO2: 95% 97% 97% 97%    General: Pt is alert, follows commands appropriately, not in acute distress Cardiovascular: Regular rate and rhythm, S1/S2 +, no murmurs, no rubs, no gallops Respiratory: Clear to auscultation bilaterally, no wheezing, no crackles, no rhonchi Abdominal: Soft, non tender, non distended, bowel sounds +, no guarding Extremities: no edema, no cyanosis, pulses  palpable bilaterally DP and PT Neuro: Grossly nonfocal  Discharge Instructions  Discharge Orders   Future Appointments Provider Department Dept Phone   10/01/2013 1:45 PM [name], MD [place] [phone]   Future Orders Complete By Expires   Diet - low sodium heart healthy  As directed    Increase activity slowly  As directed        Medication List         amLODipine 5 MG tablet  Commonly known as:  NORVASC  Take 5 mg by mouth daily.     ARIPiprazole 5 MG tablet  Commonly known as:  ABILIFY  Take 5 mg by mouth daily.     aspirin 81 MG tablet  Take 1 tablet (81 mg total) by mouth daily.     atorvastatin 20 MG tablet  Commonly known as:  LIPITOR  Take 20 mg by mouth daily.     benazepril 20 MG tablet  Commonly known as:  LOTENSIN  Take 20 mg by mouth daily.     clonazePAM 2 MG tablet  Commonly known as:  KLONOPIN  Take 2 mg by mouth 2 (two) times daily as needed for anxiety (anxiety).     cyclobenzaprine 5 MG tablet  Commonly known as:  FLEXERIL  TAKE 1 TABLET BY MOUTH THREE TIMES DAILY AS NEEDED MUSCLE SPASMS     divalproex 250 MG DR tablet  Commonly known as:  DEPAKOTE  Take 250-500 mg by mouth 2 (two) times daily. 250 mg in morning and 500 mg at bedtime     FLUoxetine 40 MG capsule  Commonly known as:  PROZAC  Take 40 mg by mouth daily.     fluticasone 50 MCG/ACT nasal spray  Commonly known as:  FLONASE  Place 1 spray into both nostrils daily.     gabapentin 400 MG capsule  Commonly known as:  NEURONTIN  Take 800 mg by mouth 4 (four) times daily.     indomethacin 25 MG capsule  Commonly known as:  INDOCIN  Take 50 mg by mouth 2 (two) times daily with a meal.     isosorbide mononitrate 60 MG 24 hr tablet  Commonly known as:  IMDUR  Take 90 mg by mouth daily.     metFORMIN 500 MG tablet  Commonly known as:  GLUCOPHAGE  Take by mouth 2 (two) times daily with a meal.     oxyCODONE 15 MG immediate release tablet   Commonly known as:  ROXICODONE  Take 1 tablet (15 mg total) by mouth every 6 (six) hours as needed for pain. With limit 4 per day - to fill Jun 21, 2013     prasugrel 10 MG Tabs tablet  Commonly known as:  EFFIENT  Take 10 mg by mouth daily.     predniSONE 5 MG tablet  Commonly known as:  DELTASONE  Take 50 mg tablet today and taper down by 5 mg daily until completed     sildenafil 50 MG tablet  Commonly known as:  VIAGRA  Take 50 mg by mouth daily as needed for erectile dysfunction.     sildenafil 50 MG tablet  Commonly known as:  VIAGRA  Take 1 tablet (50 mg total) by mouth daily as needed for erectile dysfunction.     traZODone 100 MG tablet  Commonly known as:  DESYREL  Take 100 mg by mouth at bedtime.           Follow-up Information   Schedule an appointment as soon as possible for a visit with [name], MD.   Specialties:  Internal Medicine, Radiology   Contact information:   [place] [address] [phone]       Follow up with Jessy Oto, MD.   Specialty:  Orthopedic Surgery   Contact information:   [place] [address] [phone]        The results of significant diagnostics from this hospitalization (including imaging, microbiology, ancillary and laboratory) are listed below for reference.     Microbiology: Recent Results (from the past 240 hour(s))  CULTURE, BLOOD (ROUTINE X 2)     Status: None   Collection Time    09/02/13  3:24 PM      Result Value Ref Range Status   Specimen Description BLOOD RIGHT FOREARM   Final   Special Requests BOTTLES DRAWN AEROBIC AND ANAEROBIC 4ML   Final   Culture  Setup Time     Final   Value: 09/02/2013 20:55     Performed at [facility]   Culture     Final   Value:        BLOOD CULTURE RECEIVED NO GROWTH TO DATE CULTURE WILL BE HELD FOR 5 DAYS BEFORE  ISSUING A FINAL NEGATIVE REPORT     Performed at [facility]   Report Status PENDING    Incomplete  CULTURE, BLOOD (ROUTINE X 2)     Status: None   Collection Time    09/02/13  4:00 PM      Result Value Ref Range Status   Specimen Description BLOOD RIGHT HAND   Final   Special Requests BOTTLES DRAWN AEROBIC ONLY 3ML   Final   Culture  Setup Time     Final   Value: 09/02/2013 20:55     Performed at [facility]   Culture     Final   Value:        BLOOD CULTURE RECEIVED NO GROWTH TO DATE CULTURE WILL BE HELD FOR 5 DAYS BEFORE ISSUING A FINAL NEGATIVE REPORT     Performed at [facility]   Report Status PENDING   Incomplete  CSF CULTURE     Status: None   Collection Time    09/02/13  5:30 PM      Result Value Ref Range Status   Specimen Description CSF   Final   Special Requests NONE   Final   Gram Stain     Final   Value: WBC PRESENT, PREDOMINANTLY MONONUCLEAR     NO ORGANISMS SEEN     CYTOSPIN Performed by [facility]     Performed at [facility]   Culture     Final   Value: NO GROWTH 1 DAY     Performed at [facility]   Report Status PENDING   Incomplete  GRAM STAIN     Status: None   Collection Time    09/02/13  5:30 PM      Result Value Ref Range Status   Specimen Description CSF   Final   Special Requests NONE   Final   Gram Stain     Final   Value: NO ORGANISMS SEEN     WBC PRESENT, PREDOMINANTLY MONONUCLEAR     CYTOSPIN     Gram Stain Report Called to,Read Back By and Verified With: A LEDWELL RN 1933 09/02/13 A NAVARRO   Report Status 09/02/2013 FINAL   Final  RAPID STREP SCREEN     Status: None   Collection Time    09/02/13  8:55 PM      Result Value Ref Range Status   Streptococcus, Group A Screen (Direct) NEGATIVE  NEGATIVE Final   Comment: (NOTE)     A Rapid Antigen test may result negative if the antigen level in the     sample is below the detection level of this test. The FDA has not     cleared this test as a stand-alone test therefore the rapid antigen     negative result has reflexed to a Group A  Strep culture.     Labs: Basic Metabolic Panel:  Recent Labs Lab 09/02/13 1523 09/03/13 0345 09/04/13 0355  NA 138 140 140  K 3.9 4.0 4.5  CL 104 103 103  CO2 23 26 27   GLUCOSE 133* 124* 148*  BUN 16 12 14   CREATININE 0.86 0.76 0.80  CALCIUM 8.7 8.4 8.6   Liver Function Tests: No results found for this basename: AST, ALT, ALKPHOS, BILITOT, PROT, ALBUMIN,  in the last 168 hours No results found for this basename: LIPASE, AMYLASE,  in the last 168 hours No results found for this basename: AMMONIA,  in the last 168 hours  CBC:  Recent Labs Lab 09/02/13 1523 09/03/13 0345 09/04/13 0355  WBC 6.0 5.6 6.1  HGB 12.2* 12.1* 12.3*  HCT 36.6* 34.2* 35.8*  MCV 92.0 90.2 90.2  PLT 162 165 163   Cardiac Enzymes: No results found for this basename: CKTOTAL, CKMB, CKMBINDEX, TROPONINI,  in the last 168 hours BNP: BNP (last 3 results)  Recent Labs  05/03/13 0103  PROBNP 40.3   CBG:  Recent Labs Lab 09/03/13 1156 09/03/13 1824 09/03/13 2116 09/04/13 0732 09/04/13 1110  GLUCAP 147* 156* 195* 128* 134*     SIGNED: Time coordinating discharge: Over 30 minutes  Theodis Blaze, MD  [facility] 09/04/2013, 11:48 AM Pager [phone]  If 7PM-7AM, please contact night-coverage [email or web address] Password [email or web address]

## 2013-09-06 LAB — CSF CULTURE W GRAM STAIN: Culture: NO GROWTH

## 2013-09-06 LAB — CSF CULTURE

## 2013-09-08 LAB — CULTURE, BLOOD (ROUTINE X 2)
Culture: NO GROWTH
Culture: NO GROWTH

## 2013-09-10 ENCOUNTER — Ambulatory Visit (INDEPENDENT_AMBULATORY_CARE_PROVIDER_SITE_OTHER): Payer: Medicare HMO | Admitting: Psychology

## 2013-09-10 DIAGNOSIS — F331 Major depressive disorder, recurrent, moderate: Secondary | ICD-10-CM

## 2013-09-16 DIAGNOSIS — M4802 Spinal stenosis, cervical region: Secondary | ICD-10-CM

## 2013-09-16 DIAGNOSIS — M5137 Other intervertebral disc degeneration, lumbosacral region: Secondary | ICD-10-CM

## 2013-09-16 DIAGNOSIS — M542 Cervicalgia: Secondary | ICD-10-CM

## 2013-09-17 ENCOUNTER — Encounter (HOSPITAL_COMMUNITY): Payer: Self-pay | Admitting: Pharmacy Technician

## 2013-09-17 ENCOUNTER — Other Ambulatory Visit (HOSPITAL_COMMUNITY): Payer: Self-pay | Admitting: Specialist

## 2013-09-18 ENCOUNTER — Telehealth: Payer: Self-pay | Admitting: Internal Medicine

## 2013-09-18 NOTE — Telephone Encounter (Signed)
Referral for Dr Louanne Skye faxed to [employer] [phone], awaiting approval

## 2013-09-18 NOTE — Telephone Encounter (Signed)
auth # [id number] start date 09/15/13 exp 12/14/13 good for 4 visits

## 2013-09-19 ENCOUNTER — Ambulatory Visit: Payer: Commercial Managed Care - HMO | Admitting: Psychology

## 2013-09-19 NOTE — H&P (Signed)
Travis [name] is an 63 y.o. male.   Chief Complaint: neck pain with radiation into bilateral shoulders. HPI:  Pt has severe limitation of lateral bending and rotation by as much as 40% to 50%.  Extension is decreased.  He notes pain when he extends his neck, pain radiating into his shoulder in the interscapular area consistent with Lhermitte phenomenon.  His clinical exam shows some minimal weakness in his biceps, triceps, and brachioradialis, minimal weakness in wrist dorsiflexion and volar flexion, finger extension and flexion.  Patient's reflexes at the biceps show pathologic spread into the fingers and wrists with flexion occurring, both sides.  Hoffmann sign is positive on the right hand, all digits, as well as left index and long finger.  Pain with extension of the neck, lateral bending and rotation as well.    MRI of his cervical spine shows he has significant stenosis associated with the C3-4 level with narrowing of the cervical canal and the AP diameter to about 6 or 7 mm.  The disk at this level is broad based and combined with disk osteophyte.  It has progressed from previous study of July 2011 and there is distortion of the left ventral surface of the cord contact with the cord.  There is narrowing of the cervical canal at this level to about 7 mm.  Also noted some very mild central narrowing at C4-5, uncovertebral spurs causing mild to moderate foraminal narrowing both sides.  At 5-6 no significant stenosis, at 6-7 disk osteophyte complex asymmetric to the right.  Some mild central and right foraminal narrowing.  Asymmetric facet hypertrophy seen at C7-T1 without stenosis.    The results of his study suggest that he shpold undergo decompression at C3-4 anteriorly and this would be anterior approach performing removal of the endplates, inferior aspect of C3, superior aspect of C4 and a partial corpectomy fashion with then the use of bone graft, then plates and screws at C3-4.  As he is having  problems with walking, as he is having Lhermitte-type phenomenon associated with his neck, decompression at this segment is warranted.     Past Medical History  Diagnosis Date  . Impaired glucose tolerance 09/27/2010  . HYPERLIPIDEMIA-MIXED 07/15/2009  . Chronic pain syndrome 10/27/2009  . HYPERTENSION 06/24/2009  . CORONARY ARTERY DISEASE 06/24/2009    a. s/p multiple PCIs - In 2008 he had a Taxus DES to the mild LAD, Endeavor DES to mid LCX and distal LCX. In January 2009 he had DES to distal LCX, mid LCX and proximal LCX. In November 2009 had BMS x 2 to the mid RCA. Cath 10/2011 with patent stents, noncardiac CP. LHC 01/2013: patent stents (noncardiac CP).  [name] URETHRAL STRICTURE 06/24/2009  . DEGENERATIVE JOINT DISEASE 06/24/2009  . SHOULDER PAIN, BILATERAL 06/24/2009  . ANKLE PAIN, RIGHT 06/24/2009  . Willcox DISEASE, LUMBAR 04/19/2010  . SCIATICA, LEFT 04/19/2010  . NEPHROLITHIASIS, HX OF 06/24/2009  . ERECTILE DYSFUNCTION, ORGANIC 05/30/2010  . OSA (obstructive sleep apnea)     not using CPAP very often  . Obesity   . Allergy to clopidogrel   . ALLERGIC RHINITIS 06/24/2009  . Anxiety   . Depression     Prior suicide attempt    Past Surgical History  Procedure Laterality Date  . Tonsillectomy    . Rotator cuff repair      bilateral  . S/p knee replacement  2008  . S/p bilat wrist surgury    . S/p urethral dilations,  multi 2009,2010  .  Carpal tunnel release Bilateral   . S/p right knee arthroscopy  03/2005    and multi prior  . S/p umbilical hernia    . S/p left knee arthroscopy  june 2002    multi prior  . S/p right shoulder rotater cuff surgury  april 2011    Dr. [name] Lighter  . Coronary angioplasty with stent placement      Family History  Problem Relation Age of Onset  . Depression Mother   . Heart disease Mother   . Hypertension Mother   . Cancer Mother     Breast  . Diabetes Father   . Heart disease Father     CABG  . Cancer Father     prostate and skin cancer  . Hypertension  Father   . Hyperlipidemia Father   . Depression Brother     x 2  . Hypertension Brother     x2  . Coronary artery disease Other   . Hypertension Other   . Depression Other   . Heart disease Maternal Grandfather   . Early death Maternal Grandfather 48    heart attack  . Early death Paternal Grandfather    Social History:  reports that he quit smoking about 3 years ago. His smoking use included Cigars. He has never used smokeless tobacco. He reports that he drinks alcohol. He reports that he does not use illicit drugs.  Allergies: No Known Allergies  No prescriptions prior to admission    No results found for this or any previous visit (from the past 48 hour(s)). No results found.  Review of Systems  Constitutional: Negative.   Eyes: Negative.   Respiratory: Negative.   Cardiovascular: Negative.   Gastrointestinal: Negative.   Genitourinary: Negative.   Musculoskeletal: Positive for back pain, joint pain and neck pain.  Skin: Negative.   Neurological: Positive for tingling, sensory change and focal weakness.       Legs are weak and give out easily.  Falls frequently at home.  Wears brace on right foot and ankle for Charcot collapse of the right foot.  Endo/Heme/Allergies: Negative.   Psychiatric/Behavioral: Negative.     There were no vitals taken for this visit. Physical Exam  Constitutional: He is oriented to person, place, and time. He appears well-developed and well-nourished.  HENT:  Head: Normocephalic and atraumatic.  Eyes: EOM are normal. Pupils are equal, round, and reactive to light.  Cardiovascular: Normal rate, regular rhythm and normal heart sounds.   Respiratory: Effort normal and breath sounds normal.  GI: Soft.  Musculoskeletal:   severe limitation of lateral bending and rotation by as much as 40% to 50%.  Extension is decreased.  He notes pain when he extends his neck, pain radiating into his shoulder in the interscapular area consistent with Lhermitte  phenomenon.  His clinical exam shows some minimal weakness in his biceps, triceps, and brachioradialis, minimal weakness in wrist dorsiflexion and volar flexion, finger extension and flexion.  Patient's reflexes at the biceps show pathologic spread into the fingers and wrists with flexion occurring, both sides.  Hoffmann sign is positive on the right hand, all digits, as well as left index and long finger.  Pain with extension of the neck, lateral bending and rotation as well.  His motor exam in both lower extremities shows weakness in right foot dorsiflexion that is chronic.  He has a brace for the right foot, a planovalgus foot deformity.  He has normal knee extension and flexion present.  Hip abduction/adduction,  hip flexion testing are normal.  Neurological: He is alert and oriented to person, place, and time. He has normal reflexes.  Skin: Skin is warm and dry.  Psychiatric: He has a normal mood and affect.     Assessment/Plan C3-4 stenosis  PLAN:  ACDF C3-4 with allograft,plate and screws.  DISPOSITION PLANNING:  Pt with concerns about returning home alone.  No assist with family or friends.  He has checked with his insurance and has been told they will pay for 120 day stay at nursing facility.  Pt advised a consult for social worker will be made post op and after surgery decision will be made for SNF vs home.  Epimenio Foot 09/19/2013, 4:19 PM

## 2013-09-23 NOTE — Pre-Procedure Instructions (Signed)
Travis [name]  09/23/2013   Your procedure is scheduled on:  Friday Sep 26, 2013 at 12:30 PM.  Report to [facility] Short Stay Entrance "A"  Admitting at 10:30 AM.  Call this number if you have problems the morning of surgery: [phone]   Remember:   Do not eat food or drink liquids after midnight.   Take these medicines the morning of surgery with A SIP OF WATER: Amlodipine (Norvasc), Aripiprazole (Abilify), Clonazepam (Klonopin) if needed, Divalproex (Depakote), Fluoxetine (Prozac), Flonase nasal spray, Gabapentin (Neurontin), Isosorbide (Imdur), Oxycodone   Do not wear jewelry.  Do not wear lotions, powders, or colognes.   Men may shave face and neck.  Do not bring valuables to the hospital.  [facility] is not responsible for any belongings or valuables.               Contacts, dentures or bridgework may not be worn into surgery.  Leave suitcase in the car. After surgery it may be brought to your room.  For patients admitted to the hospital, discharge time is determined by your treatment team.               Patients discharged the day of surgery will not be allowed to drive home.  Name and phone number of your driver: Family/Friend  Special Instructions: Shower using CHG soap the night before and the morning of your surgery using CHG soap   Please read over the following fact sheets that you were given: Pain Booklet, Coughing and Deep Breathing, MRSA Information and Surgical Site Infection Prevention

## 2013-09-24 ENCOUNTER — Encounter (HOSPITAL_COMMUNITY)
Admission: RE | Admit: 2013-09-24 | Discharge: 2013-09-24 | Disposition: A | Discharge status: Home / Self Care | Payer: Medicare HMO | Source: Ambulatory Visit | Attending: Specialist | Admitting: Specialist

## 2013-09-24 ENCOUNTER — Encounter (HOSPITAL_COMMUNITY): Payer: Self-pay

## 2013-09-24 HISTORY — DX: Acute myocardial infarction, unspecified: I21.9

## 2013-09-24 HISTORY — DX: Personal history of urinary calculi: Z87.442

## 2013-09-24 HISTORY — DX: Pneumonia, unspecified organism: J18.9

## 2013-09-24 HISTORY — DX: Cardiac arrhythmia, unspecified: I49.9

## 2013-09-24 LAB — SURGICAL PCR SCREEN
MRSA, PCR: NEGATIVE
Staphylococcus aureus: POSITIVE — AB

## 2013-09-24 LAB — CBC
HCT: 37.9 % — ABNORMAL LOW (ref 39.0–52.0)
HEMOGLOBIN: 12.8 g/dL — AB (ref 13.0–17.0)
MCH: 31.1 pg (ref 26.0–34.0)
MCHC: 33.8 g/dL (ref 30.0–36.0)
MCV: 92 fL (ref 78.0–100.0)
PLATELETS: 157 10*3/uL (ref 150–400)
RBC: 4.12 MIL/uL — ABNORMAL LOW (ref 4.22–5.81)
RDW: 13.8 % (ref 11.5–15.5)
WBC: 3.9 10*3/uL — ABNORMAL LOW (ref 4.0–10.5)

## 2013-09-24 LAB — BASIC METABOLIC PANEL
BUN: 20 mg/dL (ref 6–23)
CALCIUM: 8.8 mg/dL (ref 8.4–10.5)
CO2: 22 mEq/L (ref 19–32)
CREATININE: 1.03 mg/dL (ref 0.50–1.35)
Chloride: 102 mEq/L (ref 96–112)
GFR, EST AFRICAN AMERICAN: 89 mL/min — AB (ref >90)
GFR, EST NON AFRICAN AMERICAN: 76 mL/min — AB (ref >90)
GLUCOSE: 111 mg/dL — AB (ref 70–99)
POTASSIUM: 4.5 meq/L (ref 3.7–5.3)
Sodium: 140 mEq/L (ref 137–147)

## 2013-09-24 LAB — URINALYSIS, ROUTINE W REFLEX MICROSCOPIC
Bilirubin Urine: NEGATIVE
Glucose, UA: NEGATIVE mg/dL
Hgb urine dipstick: NEGATIVE
Ketones, ur: NEGATIVE mg/dL
LEUKOCYTES UA: NEGATIVE
Nitrite: NEGATIVE
Protein, ur: NEGATIVE mg/dL
Specific Gravity, Urine: 1.018 (ref 1.005–1.030)
UROBILINOGEN UA: 0.2 mg/dL (ref 0.0–1.0)
pH: 5.5 (ref 5.0–8.0)

## 2013-09-24 NOTE — Progress Notes (Addendum)
Nurse called prescription in to [facility] and then Nurse called patient and instructed him to pick ointment up at his earliest convenience, apply ointment to nares, and bring ointment with him DOS. Patient verbalized understanding. Patient informed Nurse that his insurance would cover him to stay in a assisted living facility after surgery. Nurse informed him that he would be assigned a case worker after admission to the hospital and that case worker would assess all his needs before discharge. Patient verbalized understanding.

## 2013-09-24 NOTE — Progress Notes (Addendum)
Anesthesia PAT Evaluation:  Patient is a 63 year old male scheduled for C3-4 ACDF on 09/26/13 by Dr. Louanne Skye.    History includes former smoker, DM2, HTN, HLD, chronic pain syndrome, CAD s/p "nine" stents all done in [place], [place] (last in 2006), nephrolithiasis, anxiety, depression, self catheterization, OSA, ED.  He reports multiple falls, one yesterday in which he primarily hit his shoulder but his head did hit the floor but not hard.  There was no LOC, bruising, or bumps on his head.  BMI is 38 consistent with obesity. PCP is Dr. [name]. Psychiatrist is Dr. Adele Schilder.  Cardiologist is Dr. Aundra Dubin, last visit 02/2013.  According to cardiology notes, he has had "poor platelet inhibition with Plavix" so he is on Effient. Patient said his last dose was 09/19/13 in anticipation for surgery.  He said that Dr. Claris Gladden office said he could hold 7 days preoperatively and restart 1-2 days post-operatively, but I don't see any documentation of this. Also he said that when it is resumed he will need a new refill, but would like something cheaper.  I told him that he would have to discuss this with Dr. Aundra Dubin.   EKG on 09/02/13 showed SR, non-specific IVCD, baseline wanderer in V2.  Cardiac cath on 02/10/13 showed: 1. Triple vessel CAD with patent stents in the RCA, LAD and Circumflex.  2. Normal LV systolic function, EF 95%. 3. Non-cardiac chest pain. Continue medical management.   Echo on 07/21/09 showed: Left ventricle: The cavity size was normal. There was mild concentric hypertrophy. Systolic function was normal. The estimated ejection fraction was in the range of 55% to 60%. Wall motion was normal; there were no regional wall motion abnormalities. Doppler parameters are consistent with abnormal left ventricular relaxation (grade 1 diastolic dysfunction).   CXR on 09/02/13 showed: No acute abnormality.  Preoperative labs noted.  On exam, he is a pleasant obese white male in NAD.  He was A&O X 4.  No  abrasions, swelling, ecchymosis noted on his head or face.  He gait is slow and deliberate.  Heart RRR, no murmur noted.  Lungs clear. No carotid bruits noted.  He does have BLE edema, R > L.  He is wearing an ankle boot on the right. He denies any calf pain.  He said he took/was given Nitro last during his ED visit 09/02/13 for neck and back pain as a precaution (EMS and ED staff were concerned these may be masking any chest pain). He ruled out by cardiac enzymes.  Prior to that his last Nitro was in 01/2013 (during an arrest by notes) and he ultimately had a cardiac cath that showed patent stents.  He has had no further chest pain or new SOB.  He has chronic LE edema which he feels may be worse in his RLE since he began wearing his ankle boot.    I reviewed above with anesthesiologist Dr. Albertina Parr.  Although patient with a reassuring cath within the past year, he has an extensive cardiac history.  He will need cardiac clearance.  Of notes, patient says that he will ultimately need additional surgery on his back.  Also patient expressed concerns about lack of help at home after discharge and fear of being at even greater risk for falls after discharge.  He apparently already spoke with Dr. Otho Ket physician assistant today about this.  I encouraged him to also discuss with Dr. Louanne Skye and request a case manager consult.  I notified Sherry at Dr. Otho Ket  office of need for cardiac clearance.  [facility] Short Stay Center/Anesthesiology Phone [phone] 09/24/2013 4:08 PM  Addendum: 09/25/13 4:57 PM Received message from Claiborne Billings at Dr. Otho Ket office this afternoon.  They have contacted Dr. Claris Gladden office regarding surgical clearance.  Apparently, he is in the office today and will review.  She hopes to have a response by tomorrow morning.  Short Stay staff will have to follow-up tomorrow morning. (09/26/2013 9:40 AM Received noted signed by Dr. Aundra Dubin stating that "If no symptomatic changes, ok  to hold Effient, cont beta blockers."  Further evaluation by his assigned anesthesiologist this morning.)

## 2013-09-24 NOTE — Progress Notes (Signed)
Patient informed Nurse that had a cardiac cath and has 9 stents in his heart. Patient denied having any acute cardiac or pulmonary issues. Will have Ebony Hail, [place] to see patient before leaving PAT. PCP is [name] and patient saw Dr. Baird Lyons at [facility] for a sleep study, but patient stated "they wanted me to have a sleep study but I blew it off. I have a CPAP machine in [place] and I didn't wear it there so...Marland KitchenMarland Kitchen" Sleep apnea results sent to PCP. When asked about Effient patient stated "I haven't had that in a week because I haven't been able to afford it, but Dr. Aundra Dubin wants me to be off of it for 7 days anyways so......." Patient voiced concern about going home after surgery. Patient stated "I talked to Dr. Otho Ket PA today and told them about me living home alone and they told me that my insurance probably would not cover me to stay in a nursing home for a week. My brother is going out of town on vacation after he drops me off for my surgery on Friday, and my niece is coming from out of town to pick me up and take me home on Saturday. I'm just worried about falling and messing up everything Dr. Louanne Skye is going to do." Nurse asked patient when he last fell and he stated "yesterday. I don't know what happened I just fell, but I fall all the time." Nurse asked patient if he was seen by medical personnel and he stated "no."

## 2013-09-25 MED ORDER — CHLORHEXIDINE GLUCONATE 4 % EX LIQD
60.0000 mL | Freq: Once | CUTANEOUS | Status: DC
  Filled 2013-09-25: qty 60

## 2013-09-25 MED ORDER — DEXTROSE 5 % IV SOLN
3.0000 g | INTRAVENOUS | Status: AC
  Administered 2013-09-26: 3 g via INTRAVENOUS
  Filled 2013-09-25: qty 3000

## 2013-09-25 NOTE — Progress Notes (Signed)
Spoke with Dr. Otho Ket office, Joycelyn Schmid,  She will try to get in touch with Maudie Mercury since sherry is in a meeting to see if she was able to obtain cardiac clearance for patient.  She will call me back. Joycelyn Schmid called back and said Nira Conn, in Dr. Otho Ket office will look into this.

## 2013-09-26 ENCOUNTER — Inpatient Hospital Stay (HOSPITAL_COMMUNITY): Payer: Medicare HMO | Admitting: Anesthesiology

## 2013-09-26 ENCOUNTER — Encounter (HOSPITAL_COMMUNITY): Payer: Medicare HMO | Admitting: Vascular Surgery

## 2013-09-26 ENCOUNTER — Encounter (HOSPITAL_COMMUNITY): Payer: Self-pay | Admitting: *Deleted

## 2013-09-26 ENCOUNTER — Encounter (HOSPITAL_COMMUNITY)
Admission: RE | Disposition: A | Discharge status: Skilled Nursing Facility | Payer: Commercial Managed Care - HMO | Source: Ambulatory Visit | Attending: Specialist

## 2013-09-26 ENCOUNTER — Inpatient Hospital Stay (HOSPITAL_COMMUNITY)
Admission: RE | Admit: 2013-09-26 | Discharge: 2013-10-01 | DRG: 472 | Disposition: A | Discharge status: Skilled Nursing Facility | Payer: Medicare HMO | Source: Ambulatory Visit | Attending: Specialist | Admitting: Specialist

## 2013-09-26 ENCOUNTER — Inpatient Hospital Stay (HOSPITAL_COMMUNITY): Payer: Medicare HMO

## 2013-09-26 ENCOUNTER — Telehealth: Payer: Self-pay | Admitting: Cardiology

## 2013-09-26 DIAGNOSIS — F411 Generalized anxiety disorder: Secondary | ICD-10-CM | POA: Diagnosis present

## 2013-09-26 DIAGNOSIS — F329 Major depressive disorder, single episode, unspecified: Secondary | ICD-10-CM | POA: Diagnosis present

## 2013-09-26 DIAGNOSIS — D62 Acute posthemorrhagic anemia: Secondary | ICD-10-CM | POA: Diagnosis not present

## 2013-09-26 DIAGNOSIS — G4733 Obstructive sleep apnea (adult) (pediatric): Secondary | ICD-10-CM

## 2013-09-26 DIAGNOSIS — Z6836 Body mass index (BMI) 36.0-36.9, adult: Secondary | ICD-10-CM

## 2013-09-26 DIAGNOSIS — N35919 Unspecified urethral stricture, male, unspecified site: Secondary | ICD-10-CM

## 2013-09-26 DIAGNOSIS — E785 Hyperlipidemia, unspecified: Secondary | ICD-10-CM | POA: Diagnosis present

## 2013-09-26 DIAGNOSIS — Z01818 Encounter for other preprocedural examination: Secondary | ICD-10-CM

## 2013-09-26 DIAGNOSIS — M48061 Spinal stenosis, lumbar region without neurogenic claudication: Secondary | ICD-10-CM | POA: Insufficient documentation

## 2013-09-26 DIAGNOSIS — Z9861 Coronary angioplasty status: Secondary | ICD-10-CM

## 2013-09-26 DIAGNOSIS — M48062 Spinal stenosis, lumbar region with neurogenic claudication: Secondary | ICD-10-CM

## 2013-09-26 DIAGNOSIS — Z96659 Presence of unspecified artificial knee joint: Secondary | ICD-10-CM

## 2013-09-26 DIAGNOSIS — I2510 Atherosclerotic heart disease of native coronary artery without angina pectoris: Secondary | ICD-10-CM | POA: Diagnosis present

## 2013-09-26 DIAGNOSIS — Z01812 Encounter for preprocedural laboratory examination: Secondary | ICD-10-CM

## 2013-09-26 DIAGNOSIS — R338 Other retention of urine: Secondary | ICD-10-CM

## 2013-09-26 DIAGNOSIS — E782 Mixed hyperlipidemia: Secondary | ICD-10-CM | POA: Diagnosis present

## 2013-09-26 DIAGNOSIS — Z79899 Other long term (current) drug therapy: Secondary | ICD-10-CM

## 2013-09-26 DIAGNOSIS — M4802 Spinal stenosis, cervical region: Secondary | ICD-10-CM

## 2013-09-26 DIAGNOSIS — R5082 Postprocedural fever: Secondary | ICD-10-CM

## 2013-09-26 DIAGNOSIS — E1165 Type 2 diabetes mellitus with hyperglycemia: Secondary | ICD-10-CM

## 2013-09-26 DIAGNOSIS — M47812 Spondylosis without myelopathy or radiculopathy, cervical region: Principal | ICD-10-CM | POA: Diagnosis present

## 2013-09-26 DIAGNOSIS — E669 Obesity, unspecified: Secondary | ICD-10-CM

## 2013-09-26 DIAGNOSIS — Z87891 Personal history of nicotine dependence: Secondary | ICD-10-CM

## 2013-09-26 DIAGNOSIS — I10 Essential (primary) hypertension: Secondary | ICD-10-CM | POA: Diagnosis present

## 2013-09-26 DIAGNOSIS — J069 Acute upper respiratory infection, unspecified: Secondary | ICD-10-CM

## 2013-09-26 DIAGNOSIS — IMO0001 Reserved for inherently not codable concepts without codable children: Secondary | ICD-10-CM

## 2013-09-26 DIAGNOSIS — J209 Acute bronchitis, unspecified: Secondary | ICD-10-CM

## 2013-09-26 DIAGNOSIS — G894 Chronic pain syndrome: Secondary | ICD-10-CM | POA: Diagnosis present

## 2013-09-26 DIAGNOSIS — B1920 Unspecified viral hepatitis C without hepatic coma: Secondary | ICD-10-CM | POA: Diagnosis present

## 2013-09-26 DIAGNOSIS — Q7649 Other congenital malformations of spine, not associated with scoliosis: Secondary | ICD-10-CM | POA: Insufficient documentation

## 2013-09-26 DIAGNOSIS — I252 Old myocardial infarction: Secondary | ICD-10-CM

## 2013-09-26 DIAGNOSIS — Z01811 Encounter for preprocedural respiratory examination: Secondary | ICD-10-CM

## 2013-09-26 DIAGNOSIS — F3289 Other specified depressive episodes: Secondary | ICD-10-CM | POA: Diagnosis present

## 2013-09-26 DIAGNOSIS — R339 Retention of urine, unspecified: Secondary | ICD-10-CM | POA: Diagnosis present

## 2013-09-26 DIAGNOSIS — Z7982 Long term (current) use of aspirin: Secondary | ICD-10-CM

## 2013-09-26 DIAGNOSIS — N529 Male erectile dysfunction, unspecified: Secondary | ICD-10-CM | POA: Diagnosis present

## 2013-09-26 DIAGNOSIS — M542 Cervicalgia: Secondary | ICD-10-CM

## 2013-09-26 DIAGNOSIS — E1169 Type 2 diabetes mellitus with other specified complication: Secondary | ICD-10-CM | POA: Diagnosis present

## 2013-09-26 HISTORY — DX: Fibromyalgia: M79.7

## 2013-09-26 HISTORY — DX: Unspecified osteoarthritis, unspecified site: M19.90

## 2013-09-26 HISTORY — DX: Unspecified viral hepatitis C without hepatic coma: B19.20

## 2013-09-26 HISTORY — DX: Type 2 diabetes mellitus without complications: E11.9

## 2013-09-26 HISTORY — DX: Spinal stenosis, lumbar region with neurogenic claudication: M48.062

## 2013-09-26 HISTORY — PX: ANTERIOR CERVICAL DECOMP/DISCECTOMY FUSION: SHX1161

## 2013-09-26 HISTORY — DX: Spinal stenosis, cervical region: M48.02

## 2013-09-26 HISTORY — DX: Angina pectoris, unspecified: I20.9

## 2013-09-26 LAB — GLUCOSE, CAPILLARY
GLUCOSE-CAPILLARY: 103 mg/dL — AB (ref 70–99)
GLUCOSE-CAPILLARY: 130 mg/dL — AB (ref 70–99)
GLUCOSE-CAPILLARY: 103 mg/dL — AB (ref 70–99)
Glucose-Capillary: 114 mg/dL — ABNORMAL HIGH (ref 70–99)
Glucose-Capillary: 112 mg/dL — ABNORMAL HIGH (ref 70–99)

## 2013-09-26 SURGERY — ANTERIOR CERVICAL DECOMPRESSION/DISCECTOMY FUSION 1 LEVEL
Anesthesia: General | Site: Spine Cervical

## 2013-09-26 MED ORDER — PHENOL 1.4 % MT LIQD
1.0000 | OROMUCOSAL | Status: DC | PRN
  Administered 2013-09-28: 1 via OROMUCOSAL
  Filled 2013-09-26: qty 177

## 2013-09-26 MED ORDER — THROMBIN 20000 UNITS EX SOLR
CUTANEOUS | Status: AC
  Filled 2013-09-26: qty 20000

## 2013-09-26 MED ORDER — PROPOFOL 10 MG/ML IV BOLUS
INTRAVENOUS | Status: DC | PRN
  Administered 2013-09-26: 150 mg via INTRAVENOUS

## 2013-09-26 MED ORDER — SODIUM CHLORIDE 0.9 % IV SOLN: 250.0000 mL | INTRAVENOUS | Status: DC

## 2013-09-26 MED ORDER — FENTANYL CITRATE 0.05 MG/ML IJ SOLN
INTRAMUSCULAR | Status: DC | PRN
  Administered 2013-09-26: 100 ug via INTRAVENOUS
  Administered 2013-09-26: 50 ug via INTRAVENOUS
  Administered 2013-09-26: 150 ug via INTRAVENOUS
  Administered 2013-09-26 (×3): 100 ug via INTRAVENOUS

## 2013-09-26 MED ORDER — LACTATED RINGERS IV SOLN
INTRAVENOUS | Status: DC
  Administered 2013-09-26: 11:00:00 via INTRAVENOUS

## 2013-09-26 MED ORDER — GLYCOPYRROLATE 0.2 MG/ML IJ SOLN
INTRAMUSCULAR | Status: AC
  Filled 2013-09-26: qty 3

## 2013-09-26 MED ORDER — EPHEDRINE SULFATE 50 MG/ML IJ SOLN
INTRAMUSCULAR | Status: DC | PRN
  Administered 2013-09-26: 5 mg via INTRAVENOUS
  Administered 2013-09-26 (×2): 10 mg via INTRAVENOUS

## 2013-09-26 MED ORDER — MAGNESIUM CITRATE PO SOLN: 1.0000 | Freq: Once | ORAL | Status: AC | PRN

## 2013-09-26 MED ORDER — PRASUGREL HCL 10 MG PO TABS
10.0000 mg | ORAL_TABLET | Freq: Every day | ORAL | Status: DC
  Administered 2013-09-27 – 2013-10-01 (×5): 10 mg via ORAL
  Filled 2013-09-26 (×5): qty 1

## 2013-09-26 MED ORDER — FENTANYL CITRATE 0.05 MG/ML IJ SOLN
INTRAMUSCULAR | Status: AC
  Filled 2013-09-26: qty 5

## 2013-09-26 MED ORDER — ONDANSETRON HCL 4 MG/2ML IJ SOLN: 4.0000 mg | INTRAMUSCULAR | Status: DC | PRN

## 2013-09-26 MED ORDER — NEOSTIGMINE METHYLSULFATE 10 MG/10ML IV SOLN
INTRAVENOUS | Status: DC | PRN
  Administered 2013-09-26: 3 mg via INTRAVENOUS

## 2013-09-26 MED ORDER — HYDROCODONE-ACETAMINOPHEN 5-325 MG PO TABS: 1.0000 | ORAL_TABLET | ORAL | Status: DC | PRN

## 2013-09-26 MED ORDER — MIDAZOLAM HCL 2 MG/2ML IJ SOLN
INTRAMUSCULAR | Status: AC
  Filled 2013-09-26: qty 2

## 2013-09-26 MED ORDER — 0.9 % SODIUM CHLORIDE (POUR BTL) OPTIME
TOPICAL | Status: DC | PRN
  Administered 2013-09-26: 1000 mL

## 2013-09-26 MED ORDER — GELATIN ABSORBABLE 100 EX MISC
CUTANEOUS | Status: DC | PRN
  Administered 2013-09-26: 14:00:00 via TOPICAL

## 2013-09-26 MED ORDER — DIVALPROEX SODIUM 250 MG PO DR TAB: 250.0000 mg | DELAYED_RELEASE_TABLET | Freq: Two times a day (BID) | ORAL | Status: DC

## 2013-09-26 MED ORDER — GABAPENTIN 400 MG PO CAPS
800.0000 mg | ORAL_CAPSULE | Freq: Four times a day (QID) | ORAL | Status: DC
  Administered 2013-09-26 – 2013-09-29 (×10): 800 mg via ORAL
  Filled 2013-09-26 (×14): qty 2

## 2013-09-26 MED ORDER — BENAZEPRIL HCL 20 MG PO TABS
20.0000 mg | ORAL_TABLET | Freq: Every day | ORAL | Status: DC
  Administered 2013-09-26 – 2013-10-01 (×6): 20 mg via ORAL
  Filled 2013-09-26 (×6): qty 1

## 2013-09-26 MED ORDER — NEOSTIGMINE METHYLSULFATE 10 MG/10ML IV SOLN
INTRAVENOUS | Status: AC
  Filled 2013-09-26: qty 1

## 2013-09-26 MED ORDER — KETOROLAC TROMETHAMINE 30 MG/ML IJ SOLN
30.0000 mg | Freq: Once | INTRAMUSCULAR | Status: AC
  Administered 2013-09-26: 30 mg via INTRAVENOUS
  Filled 2013-09-26: qty 1

## 2013-09-26 MED ORDER — ONDANSETRON HCL 4 MG/2ML IJ SOLN
INTRAMUSCULAR | Status: DC | PRN
Start: 2013-09-26 — End: 2013-09-26
  Administered 2013-09-26: 4 mg via INTRAVENOUS

## 2013-09-26 MED ORDER — ACETAMINOPHEN 650 MG RE SUPP: 650.0000 mg | RECTAL | Status: DC | PRN

## 2013-09-26 MED ORDER — LIDOCAINE HCL (CARDIAC) 20 MG/ML IV SOLN
INTRAVENOUS | Status: AC
  Filled 2013-09-26: qty 5

## 2013-09-26 MED ORDER — SODIUM CHLORIDE 0.9 % IJ SOLN
3.0000 mL | Freq: Two times a day (BID) | INTRAMUSCULAR | Status: DC
  Administered 2013-09-27 – 2013-09-29 (×4): 3 mL via INTRAVENOUS

## 2013-09-26 MED ORDER — ROCURONIUM BROMIDE 50 MG/5ML IV SOLN
INTRAVENOUS | Status: AC
  Filled 2013-09-26: qty 1

## 2013-09-26 MED ORDER — POLYETHYLENE GLYCOL 3350 17 G PO PACK
17.0000 g | PACK | Freq: Every day | ORAL | Status: DC | PRN
  Filled 2013-09-26: qty 1

## 2013-09-26 MED ORDER — LACTATED RINGERS IV SOLN
INTRAVENOUS | Status: DC | PRN
  Administered 2013-09-26 (×2): via INTRAVENOUS

## 2013-09-26 MED ORDER — OXYCODONE-ACETAMINOPHEN 5-325 MG PO TABS
1.0000 | ORAL_TABLET | ORAL | Status: DC | PRN
  Administered 2013-09-28: 2 via ORAL
  Filled 2013-09-26: qty 2

## 2013-09-26 MED ORDER — LIDOCAINE HCL (CARDIAC) 20 MG/ML IV SOLN
INTRAVENOUS | Status: DC | PRN
  Administered 2013-09-26: 70 mg via INTRAVENOUS

## 2013-09-26 MED ORDER — INSULIN ASPART 100 UNIT/ML ~~LOC~~ SOLN
0.0000 [IU] | SUBCUTANEOUS | Status: DC
  Administered 2013-09-27 – 2013-09-30 (×5): 3 [IU] via SUBCUTANEOUS

## 2013-09-26 MED ORDER — PROPOFOL 10 MG/ML IV BOLUS
INTRAVENOUS | Status: AC
  Filled 2013-09-26: qty 20

## 2013-09-26 MED ORDER — AMLODIPINE BESYLATE 5 MG PO TABS
5.0000 mg | ORAL_TABLET | Freq: Every day | ORAL | Status: DC
  Administered 2013-09-26 – 2013-10-01 (×6): 5 mg via ORAL
  Filled 2013-09-26 (×6): qty 1

## 2013-09-26 MED ORDER — OXYCODONE HCL 5 MG PO TABS
30.0000 mg | ORAL_TABLET | ORAL | Status: DC | PRN
  Administered 2013-09-26 – 2013-09-29 (×13): 30 mg via ORAL
  Filled 2013-09-26 (×13): qty 6

## 2013-09-26 MED ORDER — CLONAZEPAM 1 MG PO TABS
2.0000 mg | ORAL_TABLET | Freq: Two times a day (BID) | ORAL | Status: DC | PRN
  Administered 2013-09-27: 2 mg via ORAL
  Filled 2013-09-26: qty 2

## 2013-09-26 MED ORDER — CEFAZOLIN SODIUM 1-5 GM-% IV SOLN
1.0000 g | Freq: Three times a day (TID) | INTRAVENOUS | Status: AC
  Administered 2013-09-26 – 2013-09-27 (×2): 1 g via INTRAVENOUS
  Filled 2013-09-26 (×2): qty 50

## 2013-09-26 MED ORDER — HYDROMORPHONE HCL PF 1 MG/ML IJ SOLN
INTRAMUSCULAR | Status: AC
  Administered 2013-09-26: 0.5 mg
  Filled 2013-09-26: qty 1

## 2013-09-26 MED ORDER — TRAZODONE HCL 100 MG PO TABS
100.0000 mg | ORAL_TABLET | Freq: Every day | ORAL | Status: DC
  Administered 2013-09-26 – 2013-09-28 (×2): 100 mg via ORAL
  Filled 2013-09-26 (×4): qty 1

## 2013-09-26 MED ORDER — CYCLOBENZAPRINE HCL 10 MG PO TABS
5.0000 mg | ORAL_TABLET | Freq: Three times a day (TID) | ORAL | Status: DC | PRN
  Administered 2013-09-29 – 2013-09-30 (×2): 5 mg via ORAL
  Filled 2013-09-26 (×2): qty 1

## 2013-09-26 MED ORDER — BUPIVACAINE-EPINEPHRINE (PF) 0.5% -1:200000 IJ SOLN
INTRAMUSCULAR | Status: AC
  Filled 2013-09-26: qty 30

## 2013-09-26 MED ORDER — BUPIVACAINE-EPINEPHRINE 0.5% -1:200000 IJ SOLN
INTRAMUSCULAR | Status: DC | PRN
  Administered 2013-09-26: 7 mL

## 2013-09-26 MED ORDER — ONDANSETRON HCL 4 MG/2ML IJ SOLN
INTRAMUSCULAR | Status: AC
  Filled 2013-09-26: qty 2

## 2013-09-26 MED ORDER — HYDROMORPHONE HCL PF 1 MG/ML IJ SOLN
INTRAMUSCULAR | Status: AC
  Filled 2013-09-26: qty 1

## 2013-09-26 MED ORDER — ISOSORBIDE MONONITRATE ER 60 MG PO TB24
90.0000 mg | ORAL_TABLET | Freq: Every day | ORAL | Status: DC
  Administered 2013-09-27 – 2013-10-01 (×5): 90 mg via ORAL
  Filled 2013-09-26 (×5): qty 1

## 2013-09-26 MED ORDER — BISACODYL 10 MG RE SUPP: 10.0000 mg | Freq: Every day | RECTAL | Status: DC | PRN

## 2013-09-26 MED ORDER — ALUM & MAG HYDROXIDE-SIMETH 200-200-20 MG/5ML PO SUSP: 30.0000 mL | Freq: Four times a day (QID) | ORAL | Status: DC | PRN

## 2013-09-26 MED ORDER — ASPIRIN 81 MG PO CHEW
81.0000 mg | CHEWABLE_TABLET | Freq: Every day | ORAL | Status: DC
  Administered 2013-09-27 – 2013-10-01 (×5): 81 mg via ORAL
  Filled 2013-09-26 (×5): qty 1

## 2013-09-26 MED ORDER — HYDROMORPHONE HCL PF 1 MG/ML IJ SOLN
0.2500 mg | INTRAMUSCULAR | Status: DC | PRN
  Administered 2013-09-26: 0.5 mg via INTRAVENOUS
  Administered 2013-09-26: 16:00:00 via INTRAVENOUS

## 2013-09-26 MED ORDER — SODIUM CHLORIDE 0.9 % IJ SOLN: 3.0000 mL | INTRAMUSCULAR | Status: DC | PRN

## 2013-09-26 MED ORDER — DIVALPROEX SODIUM 250 MG PO DR TAB
250.0000 mg | DELAYED_RELEASE_TABLET | Freq: Every morning | ORAL | Status: DC
  Administered 2013-09-27 – 2013-10-01 (×5): 250 mg via ORAL
  Filled 2013-09-26 (×5): qty 1

## 2013-09-26 MED ORDER — FLUOXETINE HCL 20 MG PO CAPS
40.0000 mg | ORAL_CAPSULE | Freq: Every day | ORAL | Status: DC
  Administered 2013-09-27 – 2013-10-01 (×5): 40 mg via ORAL
  Filled 2013-09-26 (×5): qty 2

## 2013-09-26 MED ORDER — FLUTICASONE PROPIONATE 50 MCG/ACT NA SUSP
1.0000 | Freq: Every day | NASAL | Status: DC
  Administered 2013-09-26 – 2013-10-01 (×4): 1 via NASAL
  Filled 2013-09-26: qty 16

## 2013-09-26 MED ORDER — ACETAMINOPHEN 325 MG PO TABS
650.0000 mg | ORAL_TABLET | ORAL | Status: DC | PRN
  Administered 2013-09-28 – 2013-10-01 (×3): 650 mg via ORAL
  Filled 2013-09-26 (×3): qty 2

## 2013-09-26 MED ORDER — ATORVASTATIN CALCIUM 20 MG PO TABS
20.0000 mg | ORAL_TABLET | Freq: Every day | ORAL | Status: DC
  Administered 2013-09-26 – 2013-10-01 (×6): 20 mg via ORAL
  Filled 2013-09-26 (×6): qty 1

## 2013-09-26 MED ORDER — ARIPIPRAZOLE 5 MG PO TABS
5.0000 mg | ORAL_TABLET | Freq: Every day | ORAL | Status: DC
  Administered 2013-09-26 – 2013-10-01 (×6): 5 mg via ORAL
  Filled 2013-09-26 (×6): qty 1

## 2013-09-26 MED ORDER — ROCURONIUM BROMIDE 100 MG/10ML IV SOLN
INTRAVENOUS | Status: DC | PRN
  Administered 2013-09-26: 50 mg via INTRAVENOUS

## 2013-09-26 MED ORDER — DIVALPROEX SODIUM 500 MG PO DR TAB
500.0000 mg | DELAYED_RELEASE_TABLET | Freq: Every day | ORAL | Status: DC
  Administered 2013-09-26 – 2013-09-30 (×5): 500 mg via ORAL
  Filled 2013-09-26 (×6): qty 1

## 2013-09-26 MED ORDER — METHOCARBAMOL 1000 MG/10ML IJ SOLN
500.0000 mg | Freq: Four times a day (QID) | INTRAVENOUS | Status: DC | PRN
  Filled 2013-09-26: qty 5

## 2013-09-26 MED ORDER — MENTHOL 3 MG MT LOZG
1.0000 | LOZENGE | OROMUCOSAL | Status: DC | PRN
  Filled 2013-09-26: qty 9

## 2013-09-26 MED ORDER — SODIUM CHLORIDE 0.9 % IV SOLN
INTRAVENOUS | Status: DC
  Administered 2013-09-26: 22:00:00 via INTRAVENOUS

## 2013-09-26 MED ORDER — GLYCOPYRROLATE 0.2 MG/ML IJ SOLN
INTRAMUSCULAR | Status: DC | PRN
  Administered 2013-09-26: 0.2 mg via INTRAVENOUS
  Administered 2013-09-26: 0.4 mg via INTRAVENOUS

## 2013-09-26 MED ORDER — DOCUSATE SODIUM 100 MG PO CAPS
100.0000 mg | ORAL_CAPSULE | Freq: Two times a day (BID) | ORAL | Status: DC
  Administered 2013-09-26 – 2013-09-30 (×9): 100 mg via ORAL
  Filled 2013-09-26 (×12): qty 1

## 2013-09-26 MED ORDER — METHOCARBAMOL 500 MG PO TABS
500.0000 mg | ORAL_TABLET | Freq: Four times a day (QID) | ORAL | Status: DC | PRN
  Administered 2013-09-26 – 2013-09-27 (×2): 500 mg via ORAL
  Filled 2013-09-26 (×2): qty 1

## 2013-09-26 MED ORDER — MORPHINE SULFATE 2 MG/ML IJ SOLN
1.0000 mg | INTRAMUSCULAR | Status: DC | PRN
  Administered 2013-09-26: 2 mg via INTRAVENOUS
  Administered 2013-09-28: 4 mg via INTRAVENOUS
  Filled 2013-09-26: qty 1
  Filled 2013-09-26: qty 2

## 2013-09-26 MED ORDER — MIDAZOLAM HCL 5 MG/5ML IJ SOLN
INTRAMUSCULAR | Status: DC | PRN
  Administered 2013-09-26 (×2): 2 mg via INTRAVENOUS

## 2013-09-26 SURGICAL SUPPLY — 61 items
ADH SKN CLS APL DERMABOND .7 (GAUZE/BANDAGES/DRESSINGS) ×1
BIT DRILL SKYLINE 14 (BIT) ×1
BIT DRILL SKYLINE 14MM (BIT) IMPLANT
BLADE SURG ROTATE 9660 (MISCELLANEOUS) IMPLANT
BUR ROUND FLUTED 4 SOFT TCH (BURR) IMPLANT
BUR SABER RD CUTTING 3.0 (BURR) ×2 IMPLANT
CANISTER SUCTION 2500CC (MISCELLANEOUS) ×2 IMPLANT
COLLAR CERV LO CONTOUR FIRM DE (SOFTGOODS) ×1 IMPLANT
CORDS BIPOLAR (ELECTRODE) ×2 IMPLANT
COVER SURGICAL LIGHT HANDLE (MISCELLANEOUS) ×2 IMPLANT
DERMABOND ADVANCED (GAUZE/BANDAGES/DRESSINGS) ×1
DERMABOND ADVANCED .7 DNX12 (GAUZE/BANDAGES/DRESSINGS) ×1 IMPLANT
DRAIN TLS ROUND 10FR (DRAIN) IMPLANT
DRAPE C-ARM 42X72 X-RAY (DRAPES) ×1 IMPLANT
DRAPE MICROSCOPE LEICA (MISCELLANEOUS) ×2 IMPLANT
DRAPE POUCH INSTRU U-SHP 10X18 (DRAPES) ×2 IMPLANT
DRAPE SURG 17X23 STRL (DRAPES) ×6 IMPLANT
DRILL BIT SKYLINE 14MM (BIT) ×2
DRSG MEPILEX BORDER 4X4 (GAUZE/BANDAGES/DRESSINGS) ×1 IMPLANT
DURAPREP 6ML APPLICATOR 50/CS (WOUND CARE) ×2 IMPLANT
ELECT BLADE 4.0 EZ CLEAN MEGAD (MISCELLANEOUS)
ELECT COATED BLADE 2.86 ST (ELECTRODE) ×2 IMPLANT
ELECT REM PT RETURN 9FT ADLT (ELECTROSURGICAL) ×2
ELECTRODE BLDE 4.0 EZ CLN MEGD (MISCELLANEOUS) IMPLANT
ELECTRODE REM PT RTRN 9FT ADLT (ELECTROSURGICAL) ×1 IMPLANT
GLOVE BIOGEL PI IND STRL 6.5 (GLOVE) IMPLANT
GLOVE BIOGEL PI IND STRL 7.5 (GLOVE) ×1 IMPLANT
GLOVE BIOGEL PI INDICATOR 6.5 (GLOVE) ×1
GLOVE BIOGEL PI INDICATOR 7.5 (GLOVE) ×1
GLOVE ECLIPSE 6.5 STRL STRAW (GLOVE) ×1 IMPLANT
GLOVE ECLIPSE 7.0 STRL STRAW (GLOVE) ×2 IMPLANT
GLOVE ECLIPSE 8.5 STRL (GLOVE) ×1 IMPLANT
GLOVE ECLIPSE 9.0 STRL (GLOVE) ×2 IMPLANT
GLOVE SURG 8.5 LATEX PF (GLOVE) ×2 IMPLANT
GOWN STRL REUS W/ TWL LRG LVL3 (GOWN DISPOSABLE) ×2 IMPLANT
GOWN STRL REUS W/TWL 2XL LVL3 (GOWN DISPOSABLE) ×2 IMPLANT
GOWN STRL REUS W/TWL LRG LVL3 (GOWN DISPOSABLE) ×4
HEAD HALTER (SOFTGOODS) ×2 IMPLANT
KIT BASIN OR (CUSTOM PROCEDURE TRAY) ×2 IMPLANT
KIT ROOM TURNOVER OR (KITS) ×2 IMPLANT
NDL SPNL 20GX3.5 QUINCKE YW (NEEDLE) ×2 IMPLANT
NEEDLE SPNL 20GX3.5 QUINCKE YW (NEEDLE) ×4 IMPLANT
NS IRRIG 1000ML POUR BTL (IV SOLUTION) ×2 IMPLANT
PACK ORTHO CERVICAL (CUSTOM PROCEDURE TRAY) ×2 IMPLANT
PAD ARMBOARD 7.5X6 YLW CONV (MISCELLANEOUS) ×4 IMPLANT
PATTIES SURGICAL .5 X.5 (GAUZE/BANDAGES/DRESSINGS) IMPLANT
PIN DISTRACTION 14MM (PIN) ×4 IMPLANT
PLATE ONE LEVEL SKYLINE 14MM (Plate) ×1 IMPLANT
SCREW VAR SELF TAP SKYLINE 14M (Screw) ×4 IMPLANT
SPACER ADV ACF 9MM (Bone Implant) ×1 IMPLANT
SPONGE INTESTINAL PEANUT (DISPOSABLE) ×1 IMPLANT
SPONGE LAP 4X18 X RAY DECT (DISPOSABLE) ×1 IMPLANT
SPONGE SURGIFOAM ABS GEL 100 (HEMOSTASIS) ×2 IMPLANT
SUT ETHILON 4 0 PS 2 18 (SUTURE) IMPLANT
SUT VIC AB 2-0 CT1 27 (SUTURE)
SUT VIC AB 2-0 CT1 TAPERPNT 27 (SUTURE) ×1 IMPLANT
SUT VIC AB 3-0 FS2 27 (SUTURE) ×2 IMPLANT
SUT VICRYL 4-0 PS2 18IN ABS (SUTURE) ×2 IMPLANT
SYSTEM CHEST DRAIN TLS 7FR (DRAIN) ×1 IMPLANT
TOWEL OR 17X24 6PK STRL BLUE (TOWEL DISPOSABLE) ×2 IMPLANT
TOWEL OR 17X26 10 PK STRL BLUE (TOWEL DISPOSABLE) ×2 IMPLANT

## 2013-09-26 NOTE — Transfer of Care (Signed)
Immediate Anesthesia Transfer of Care Note  Patient: Travis [name]  Procedure(s) Performed: Procedure(s): ANTERIOR CERVICAL DISCECTOMY FUSION C3-4, plate and screw fixation, allograft bone graft (N/A)  Patient Location: PACU  Anesthesia Type:General  Level of Consciousness: awake, alert  and oriented  Airway & Oxygen Therapy: Patient Spontanous Breathing and Patient connected to face mask oxygen  Post-op Assessment: Report given to PACU RN  Post vital signs: Reviewed and stable  Complications: No apparent anesthesia complications

## 2013-09-26 NOTE — Telephone Encounter (Signed)
Received request from Nurse fax box, documents faxed for surgical clearance. To: [employer] number: [phone] Attention: [phone]/kdm

## 2013-09-26 NOTE — Op Note (Signed)
09/26/2013  3:12 PM  PATIENT:  Travis [name]  63 y.o. male  MRN: [id number]  OPERATIVE REPORT  PRE-OPERATIVE DIAGNOSIS:  C3-4 central stenosis, multiple level spondylosis  POST-OPERATIVE DIAGNOSIS:  C3-4 central stenosis, multiple level spondylosis  PROCEDURE:  Procedure(s): ANTERIOR CERVICAL DISCECTOMY FUSION C3-4, plate and screw fixation, allograft bone graft    SURGEON:  Jessy Oto, MD     ASSISTANT:  Phillips Hay, PA-C  (Present throughout the entire procedure and necessary for completion of procedure in a timely manner)     ANESTHESIA:  General,    COMPLICATIONS:  None.   DRAINS: 7 French TLS to red top tube.     COMPONENTS: Depuy cervical plate 65m with 4 x 101BPscrews. 9 mm allograft fibula bone graft packed centrally with local bone graft.  PROCEDURE:The patient was met in the holding area, and the appropriate  left C3-4 cervical level identified and marked with an "x" and my initials. The patient was then transported to OR and was placed on the operative table in a supine position head supported on the well padded Mayfield horseshoe. The patient was then placed under  general anesthesia without difficulty intubated atraumaticly.      Cervical spine was positioned with a Mayfield horseshoe and 5 pound cervical halter traction. All pressure points well padded and semi-beach chair position. Arm holder both arms. Standard prep with DuraPrep solution the anterior cervical spine chest. Draped in the usual manner. Iodine vi drape was used. Standard timeout protocol was carried out identifying the patient procedure side of the procedure and level. The skin the left neck was infiltrated with Marcaine with epinephrine total of 7 cc. This at the level of expected C3-4  incision and also along a skin crease in line with the patients lines of Langer. Incision transverse at the C4 level and carried down to the level of the platysma. Then was carried down to the anterior aspect of  the sternocleidomastoid muscle. The interval between the trachea and esophagus medially and the carotid sheath laterally was developed as a Metzenbaum scissors and blunt dissection exposing the anterior aspect of cervical spine at the C4 level.  The prevertebral fascia anterior to the cervical was cauterized with bipolar and teased across the midline with a KArt therapist An 18-gauge spinal needle was placed with sheath intact allowing only a centimeter to extend into the C3-4  disc and observed on lateral radiogragh  at the C3-4  level. Handheld Cloward retraction of the soft tissues while identifying the level at C3-4  and also while removing a portion of the anterior aspect of the disc with15 blade scalpel and pituitary. Medial border of the longus collie muscles was carefully elevated bilaterally and self-retaining retractors were introduced the foot of the blade beneath the medial border of the longus colli muscles. Soft tissue overlying the anterior borders of the disc space at C3-4  level carefully debridement of soft tissue back to bony edges. The anterior lip osteophytes were then resected using rongeur. This bone was preserved for later bone grafting purposes. The disc space was then first prepared using loupe magnification and headlight with resection of degenerative disc annulus anteriorly and posteriorly and cartilaginous endplates using micro-curettes pituitaries and a high-speed bur. The operating room microscope was draped sterilely and brought into the field. Under the operating room microscope and posterior aspect of the disc was excised using micro-curettes pituitary rongeur and times per. Posterior lip osteophytes were drilled using a high-speed bur and a carefully  resected with 1 and 2 mm Kerrison foraminotomy performed over both C4 nerve roots using 1 and 2 mm Kerrisons disc herniation material was noted centrally and into the left foramen some of which represented reflected portion of the  patient's annulus into the neuroforamen. Following decompression of the spinal cord and both C4 nerve roots, irrigation was carried out. The endplates of the inferior aspect of C3 and superior aspect of C4 were carefully prepared using a high-speed bur to parallel. The disc space was then sounded utilized and a precontoured sounder for the transgraft implant. Surrounding was carried up to a 73m implant.  9 mm transgraft spacer was felt to provide best fit for the C3-4  disc space. The transgraft permanent lordotic allograft implant was then brought onto the field. It was then packed with local bone graft that had been harvested previously. The implant was then carefully placed over the intervertebral disc space at C3-4  level. Care taken to ensure that no bone or soft tissue debris within the disc space that could be retropulsed with insertion of the cage and bone graft. The implant was then impacted into place with the head placed in longitudinal cervical traction.  The implant was felt to be in excellent position alignment. Cervical distraction instrumentation was removed and the screw post holes coated with wax for hemostasis. Length of the plate was then chosen was a 14 mm plate.. 14 mm screws were then placed into the C3  locking the plate in place. Additional 14 mm screws were then placed in the C4 level  cervical traction had been released prior to screw placement. Intraoperative lateral radiograph demonstrated the plates and screws in good position alignment no sign of impingement upon the cervical canal. The graft appeared in good position alignment.  Upper extremity longitudinal traction using wrist restraints was necessary to obtain visualization of the lower cervical level.  This point irrigation was carried out at cervical incision site.The esophagus examined at the cervical level  and found to be normal.  A 7 french TLS drain placed exiting out the anterior neck inferior to the skin incision and sewn  in place with 4-0 nylon. Irrigation was again carried out there was no active bleeding present. The incisions were then closed by approximating the deep subcutaneous layers the platysma layer with interrupted 3-0 Vicryl suture and the superficial fascia overlying the sternocleidomastoid muscle with interrupted 3-0 Vicryl sutures. The subcutaneous layers were approximated with interrupted 3-0 Vicryl sutures as were the superficial layers. The skin was closed with a running subcutaneous stitch of 4-0 Vicryl at the operative C4 transverse incision site. Skin was approximated with Dermabond. Mepilex bandage was applied. A Philadelphia collar was then applied to the cervical spine released on the cervical spine and drapes were removed.  Physician assistant's responsibilities: SPhillips HayPA-C perform the duties of assistant physician and surgeon during this case present from the beginning of the case to the end of the case. She assisted with careful retraction of neural structures suctioning about her elements including cervical cord and C4 nerve roots. Performed closure of the incision from platysma to the skin and application of dressing. She assisted in positioning the patient had removal the patient from the OR table to her stretcher.     JJessy Oto5/12/2013, 3:12 PM

## 2013-09-26 NOTE — Progress Notes (Signed)
Orthopedic Tech Progress Note Patient Details:  Travis [name] 02-07-1951 [id number]  Ortho Devices Type of Ortho Device: Soft collar Ortho Device/Splint Location: neck Ortho Device/Splint Interventions: Ordered;Application   [name] 09/26/2013, 4:02 PM

## 2013-09-26 NOTE — Anesthesia Preprocedure Evaluation (Addendum)
Anesthesia Evaluation  Patient identified by MRN, date of birth, ID band Patient awake    Reviewed: Allergy & Precautions, H&P , NPO status , Patient's Chart, lab work & pertinent test results  Airway Mallampati: II      Dental   Pulmonary sleep apnea , pneumonia -, former smoker,  breath sounds clear to auscultation        Cardiovascular hypertension, + CAD and + Past MI Rhythm:Regular Rate:Normal  Cardiac cath on 02/10/13 showed: 1. Triple vessel CAD with patent stents in the RCA, LAD and Circumflex.   2. Normal LV systolic function, EF 77%.    Neuro/Psych    GI/Hepatic negative GI ROS, Neg liver ROS,   Endo/Other  diabetes  Renal/GU negative Renal ROS     Musculoskeletal   Abdominal   Peds  Hematology   Anesthesia Other Findings   Reproductive/Obstetrics                         Anesthesia Physical Anesthesia Plan  ASA: III  Anesthesia Plan: General   Post-op Pain Management:    Induction: Intravenous  Airway Management Planned: Oral ETT  Additional Equipment:   Intra-op Plan:   Post-operative Plan: Extubation in OR  Informed Consent: I have reviewed the patients History and Physical, chart, labs and discussed the procedure including the risks, benefits and alternatives for the proposed anesthesia with the patient or authorized representative who has indicated his/her understanding and acceptance.   Dental advisory given  Plan Discussed with: CRNA, Anesthesiologist and Surgeon  Anesthesia Plan Comments:         Anesthesia Quick Evaluation

## 2013-09-26 NOTE — Anesthesia Postprocedure Evaluation (Signed)
  Anesthesia Post-op Note  Patient: Travis [name]  Procedure(s) Performed: Procedure(s): ANTERIOR CERVICAL DISCECTOMY FUSION C3-4, plate and screw fixation, allograft bone graft (N/A)  Patient Location: PACU  Anesthesia Type:General  Level of Consciousness: awake, alert , oriented and patient cooperative  Airway and Oxygen Therapy: Patient Spontanous Breathing and Patient connected to nasal cannula oxygen  Post-op Pain: mild  Post-op Assessment: Post-op Vital signs reviewed, Patient's Cardiovascular Status Stable, Respiratory Function Stable, Patent Airway, No signs of Nausea or vomiting and Pain level controlled  Post-op Vital Signs: Reviewed and stable  Last Vitals:  Filed Vitals:   09/26/13 1615  BP: 141/64  Pulse: 81  Temp:   Resp: 11    Complications: No apparent anesthesia complications

## 2013-09-26 NOTE — Interval H&P Note (Signed)
History and Physical Interval Note:  09/26/2013 12:17 PM  [name]  has presented today for surgery, with the diagnosis of C3-4 central stenosis, multiple level spondylosis  The various methods of treatment have been discussed with the patient and family. After consideration of risks, benefits and other options for treatment, the patient has consented to  Procedure(s): ANTERIOR CERVICAL DISCECTOMY FUSION C3-4, plate and screw fixation, allograft bone graft (N/A) as a surgical intervention .  The patient's history has been reviewed, patient examined, no change in status, stable for surgery.  I have reviewed the patient's chart and labs.  Questions were answered to the patient's satisfaction.     Travis [name]

## 2013-09-26 NOTE — Brief Op Note (Signed)
09/26/2013  3:08 PM  PATIENT:  Travis [name]  63 y.o. male  PRE-OPERATIVE DIAGNOSIS:  C3-4 central stenosis, multiple level spondylosis  POST-OPERATIVE DIAGNOSIS:  C3-4 central stenosis, multiple level spondylosis  PROCEDURE:  Procedure(s): ANTERIOR CERVICAL DISCECTOMY FUSION C3-4, plate and screw fixation, allograft bone graft (N/A)  SURGEON:  Surgeon(s) and Role: Travis Oto, MD - Primary  PHYSICIAN ASSISTANT: Travis Hay, PA-C   ANESTHESIA:   general supplemented with local marcaine 1/2% with 1/200,000 epi Dr. Cindee [name]. Travis [name]  EBL:  Total I/O In: 1000 [I.V.:1000] Out: -   BLOOD ADMINISTERED:none  DRAINS: 7 French TLS left anterior neck. LOCAL MEDICATIONS USED:  MARCAINE    and Amount: 10 ml  SPECIMEN:  No Specimen  DISPOSITION OF SPECIMEN:  N/A  COUNTS:  YES  TOURNIQUET:  * No tourniquets in log *  DICTATION: .Dragon Dictation  PLAN OF CARE: Admit to inpatient   PATIENT DISPOSITION:  PACU - hemodynamically stable.   Delay start of Pharmacological VTE agent (>24hrs) due to surgical blood loss or risk of bleeding: yes

## 2013-09-26 NOTE — Discharge Instructions (Signed)
No lifting greater than 10 lbs. No overhead use of arms. Avoid bending,and twisting neck. Walk in house for first week them may start to get out slowly increasing distance up to one mile by 3 weeks post op. Keep incision dry for 3 days, may then bathe and wet incision using a Philadelphia collar when showering. Call if any fevers >101, chills, or increasing numbness or weakness or increased swelling or drainage.

## 2013-09-27 LAB — GLUCOSE, CAPILLARY
GLUCOSE-CAPILLARY: 130 mg/dL — AB (ref 70–99)
Glucose-Capillary: 101 mg/dL — ABNORMAL HIGH (ref 70–99)
Glucose-Capillary: 104 mg/dL — ABNORMAL HIGH (ref 70–99)
Glucose-Capillary: 114 mg/dL — ABNORMAL HIGH (ref 70–99)
Glucose-Capillary: 86 mg/dL (ref 70–99)
Glucose-Capillary: 97 mg/dL (ref 70–99)
Glucose-Capillary: 98 mg/dL (ref 70–99)
Glucose-Capillary: 99 mg/dL (ref 70–99)

## 2013-09-27 LAB — BASIC METABOLIC PANEL
BUN: 12 mg/dL (ref 6–23)
CALCIUM: 8.5 mg/dL (ref 8.4–10.5)
CHLORIDE: 103 meq/L (ref 96–112)
CO2: 27 mEq/L (ref 19–32)
Creatinine, Ser: 0.86 mg/dL (ref 0.50–1.35)
GFR calc non Af Amer: >90 mL/min (ref >90)
Glucose, Bld: 95 mg/dL (ref 70–99)
Potassium: 4 mEq/L (ref 3.7–5.3)
Sodium: 141 mEq/L (ref 137–147)

## 2013-09-27 LAB — HEMOGLOBIN A1C
HEMOGLOBIN A1C: 5.6 % (ref <5.7)
Mean Plasma Glucose: 114 mg/dL (ref <117)

## 2013-09-27 MED ORDER — KETOROLAC TROMETHAMINE 30 MG/ML IJ SOLN
30.0000 mg | Freq: Four times a day (QID) | INTRAMUSCULAR | Status: AC | PRN
  Administered 2013-09-27: 30 mg via INTRAVENOUS
  Filled 2013-09-27: qty 1

## 2013-09-27 NOTE — Evaluation (Signed)
Occupational Therapy Evaluation Patient Details Name: Travis [name] MRN: [id number] DOB: 1950/06/19 Today's Date: 09/27/2013    History of Present Illness 63 y.o. male s/p ANTERIOR CERVICAL DISCECTOMY FUSION C3-4. Hx of HTN, CAD, and bilateral shoulder pain.   Clinical Impression   Pt presents with below problem list. Pt independent with ADLs, PTA. Feel pt will benefit from acute OT to increase independence prior to d/c. Recommending SNF as pt lives alone.    Follow Up Recommendations  SNF    Equipment Recommendations  None recommended by OT    Recommendations for Other Services       Precautions / Restrictions Precautions Precautions: Cervical;Fall Precaution Comments: History of falls Required Braces or Orthoses: Cervical Brace;Other Brace/Splint ("[place]" orthosis) Cervical Brace: Soft collar Other Brace/Splint: "[place]" orthosis for ambulation on RLE Restrictions Weight Bearing Restrictions: No      Mobility Bed Mobility Overal bed mobility: Needs Assistance Bed Mobility: Rolling;Sidelying to Sit Rolling: Min assist Sidelying to sit: Supervision       General bed mobility comments: cues for technique. Min A to roll  Transfers Overall transfer level: Needs assistance Equipment used: Rolling walker (2 wheeled) Transfers: Sit to/from Stand Sit to Stand: Min guard;Supervision              Balance                                            ADL Overall ADL's : Needs assistance/impaired     Grooming: Wash/dry hands;Set up;Supervision/safety;Standing               Lower Body Dressing: Moderate assistance;Sit to/from stand;With adaptive equipment   Toilet Transfer: Min guard;Ambulation;Comfort height toilet;Grab bars;RW   Toileting- Clothing Manipulation and Hygiene: Min guard (standing)       Functional mobility during ADLs: Min guard;Rolling walker General ADL Comments: Reviewed dressing technique and told pt button up  shirts may be helpful. Educated on use of cup/straw to avoid bending neck so much. Educated on use of bag on walker. Practiced with AE for LB ADLs.  Discussed elastic shoe laces for shoes. Explained precautions more for comfort but to avoid extreme motions of neck.     Vision                     Perception     Praxis      Pertinent Vitals/Pain Pain 3/10. Pt appeared comfortable in chair at end of session. Pillow placed behind back.      Hand Dominance Right   Extremity/Trunk Assessment Upper Extremity Assessment Upper Extremity Assessment: RUE deficits/detail RUE Deficits / Details: significant decrease in AROM - pt reports rotator cuff tear.   Lower Extremity Assessment Lower Extremity Assessment: Defer to PT evaluation       Communication Communication Communication: No difficulties   Cognition Arousal/Alertness: Awake/alert Behavior During Therapy: WFL for tasks assessed/performed Overall Cognitive Status: Within Functional Limits for tasks assessed                     General Comments       Exercises       Shoulder Instructions      Home Living Family/patient expects to be discharged to:: Skilled nursing facility Living Arrangements: Alone Available Help at Discharge: [place] Type of Home: Mobile home Home Access: Stairs to enter [employer] of Steps:  4 Entrance Stairs-Rails: Right;Left;Can reach both Home Layout: One level               Home Equipment: Walker - 2 wheels;Cane - single point          Prior Functioning/Environment Level of Independence: Independent with assistive device(s)        Comments: Ambulates with rolling walker. able to drive and shop for groceries. No yard work, but able to perform daily chores within home.    OT Diagnosis: Acute pain   OT Problem List: Decreased range of motion;Decreased strength;Impaired balance (sitting and/or standing);Decreased knowledge of use of DME  or AE;Decreased knowledge of precautions;Pain;Impaired UE functional use   OT Treatment/Interventions: Self-care/ADL training;DME and/or AE instruction;Therapeutic activities;Patient/family education;Balance training    OT Goals(Current goals can be found in the care plan section) Acute Rehab OT Goals Patient Stated Goal: to walk and go fishing OT Goal Formulation: With patient Time For Goal Achievement: 10/04/13 Potential to Achieve Goals: Good ADL Goals Pt Will Perform Lower Body Dressing: with set-up;with supervision;with adaptive equipment;sit to/from stand Pt Will Transfer to Toilet: with modified independence;ambulating;grab bars (comfort height toilet) Pt Will Perform Toileting - Clothing Manipulation and hygiene: with modified independence;sit to/from stand Additional ADL Goal #1: Pt will perform bed mobility at Mod I level as precursor for ADLs.    OT Frequency: Min 2X/week   Barriers to D/C: Decreased caregiver support          Co-evaluation              End of Session Equipment Utilized During Treatment: Gait belt;Rolling walker Nurse Communication: Other (comment) (left O2 off)  Activity Tolerance: Patient tolerated treatment well Patient left: in chair;with call bell/phone within reach   Time: 1417-1445 OT Time Calculation (min): 28 min Charges:  OT General Charges $OT Visit: 1 Procedure OT Evaluation $Initial OT Evaluation Tier I: 1 Procedure OT Treatments $Self Care/Home Management : 8-22 mins G-Codes:    Benito Mccreedy OTR/L [phone] 09/27/2013, 3:13 PM

## 2013-09-27 NOTE — Progress Notes (Signed)
   Subjective:  Patient reports pain as severe.  Says preop symptoms have greatly improved.  Objective:   VITALS:   Filed Vitals:   09/26/13 1648 09/26/13 2059 09/27/13 0020 09/27/13 0437  BP: 134/51 133/74 117/57 117/63  Pulse: 77 55 58 69  Temp: 98.1 F (36.7 C) 97.8 F (36.6 C) 97.6 F (36.4 C) 98.1 F (36.7 C)  TempSrc: Oral Oral Oral Oral  Resp: 14 18 18 18   Height:      Weight:      SpO2: 94% 98% 95% 93%    Neurologically intact Neurovascular intact Sensation intact distally Intact pulses distally TLS drain intact. non labored breathing   Lab Results  Component Value Date   WBC 3.9* 09/24/2013   HGB 12.8* 09/24/2013   HCT 37.9* 09/24/2013   MCV 92.0 09/24/2013   PLT 157 09/24/2013     Assessment/Plan:  1 Day Post-Op   - Expected postop acute blood loss anemia - will monitor for symptoms - Up with PT/OT - DVT ppx - SCDs, ambulation, asa - drain removed - UAT - Pain control - Discharge planning - lives alone and will need placement post dc from hospital, appreciate SW and CM help  Problem List Items Addressed This Visit   None       [place] Eduard Roux 09/27/2013, 10:39 AM [phone]

## 2013-09-27 NOTE — Evaluation (Addendum)
Physical Therapy Evaluation Patient Details Name: Travis [name] MRN: [id number] DOB: 22-May-1951 Today's Date: 09/27/2013   History of Present Illness  63 y.o. male s/p ANTERIOR CERVICAL DISCECTOMY FUSION C3-4. Hx of HTN, CAD, and bilateral shoulder pain.  Clinical Impression  Patient is seen following the above procedure and presents with the following functional limitations due to the deficits listed below (see PT Problem List). Patient will benefit from skilled PT to increase their independence and safety with mobility to allow discharge to the venue listed below. Pt lives alone and will need further rehabilitation at SNF before d/c home.     Follow Up Recommendations SNF;Supervision/Assistance - 24 hour    Equipment Recommendations  3in1 (PT) (Pt may need RW - Cannot locate the one brought to hospital)    Recommendations for Other Services       Precautions / Restrictions Precautions Precautions: Cervical;Fall Precaution Comments: History of falls Required Braces or Orthoses: Cervical Brace;Other Brace/Splint ("[place]" orthosis) Cervical Brace: Soft collar Other Brace/Splint: "[place]" orthosis for ambulation on RLE Restrictions Weight Bearing Restrictions: No      Mobility  Bed Mobility Overal bed mobility: Needs Assistance Bed Mobility: Supine to Sit     Supine to sit: Supervision;HOB elevated     General bed mobility comments: Supervision for safety with HOB elevated. verbal cues for technique. Requires extra time.  Transfers Overall transfer level: Needs assistance Equipment used: Rolling walker (2 wheeled) Transfers: Sit to/from Stand Sit to Stand: Min assist         General transfer comment: Min assist for RW control. Verbal cues for hand placement. Requires extra time. Narrow BOS upon standing, able to correct with cues  Ambulation/Gait Ambulation/Gait assistance: Min assist Ambulation Distance (Feet): 100 Feet Assistive device: Rolling walker (2  wheeled) Gait Pattern/deviations: Step-through pattern;Decreased stride length;Decreased dorsiflexion - right;Antalgic;Trunk flexed;Narrow base of support Gait velocity: decreased   General Gait Details: Pt significant gait abnormality due to history of knee surgeries bilaterally, hip pain, and charcot of right foot. Pt needs orthosis for ambulation on RLE. Ambulates with externally rotated and pronated R foot. Occasionally strikes heels together during swing limb advancement due to narrow BOS. Verbal cues for wider BOS and to keep RW closer to BOS. Min assist for RW control with turns. Close guard at all times. History of falls. Cues for upright posture  Stairs            Wheelchair Mobility    Modified Rankin (Stroke Patients Only)       Balance Overall balance assessment: Needs assistance;History of Falls Sitting-balance support: No upper extremity supported;Feet supported Sitting balance-Leahy Scale: Fair Sitting balance - Comments: sits EOB without assist   Standing balance support: Bilateral upper extremity supported Standing balance-Leahy Scale: Poor Standing balance comment: Requires RW for support                             Pertinent Vitals/Pain Pt reports pain as minimal Patient repositioned in chair for comfort.     Home Living Family/patient expects to be discharged to:: Skilled nursing facility Living Arrangements: Alone Available Help at Discharge: [place] Type of Home: Mobile home Home Access: Stairs to enter Entrance Stairs-Rails: Right;Left;Can reach both Entrance Stairs-Number of Steps: 4 Home Layout: One level Home Equipment: [place] - 2 wheels;Cane - single point      Prior Function Level of Independence: Independent with assistive device(s)         Comments:  Ambulates with rolling walker. able to drive and shop for groceries. No yard work, but able to perform daily chores within home.     Hand Dominance    Dominant Hand: Right    Extremity/Trunk Assessment   Upper Extremity Assessment: RUE deficits/detail RUE Deficits / Details: significant decrease in AROM - pt reports rotator cuff tear. Denies pain RUE: Unable to fully assess due to pain RUE Sensation:  (normal)     Lower Extremity Assessment: RLE deficits/detail RLE Deficits / Details: Decreased strength and ROM of R ankle and knee. Painful with resisted ankle doris/plantarflexion. 4+/5 strength with r knee extension. 4-/5 r knee flexion.  3+ r ankle dorsiflexion    Cervical / Trunk Assessment: Other exceptions (in cervical collar)  Communication   Communication: No difficulties  Cognition Arousal/Alertness: Awake/alert Behavior During Therapy: WFL for tasks assessed/performed Overall Cognitive Status: Within Functional Limits for tasks assessed                      General Comments General comments (skin integrity, edema, etc.): Reviewed precautions and safe mobility post-op for cervical repair. Pt ambulates with "[place]" orthosis, at min assist level.    Exercises General Exercises - Lower Extremity Ankle Circles/Pumps: AROM;Both;10 reps;Seated      Assessment/Plan    PT Assessment Patient needs continued PT services  PT Diagnosis Difficulty walking;Abnormality of gait;Acute pain;Generalized weakness   PT Problem List Decreased strength;Decreased range of motion;Decreased activity tolerance;Decreased balance;Decreased mobility;Decreased knowledge of use of DME;Decreased knowledge of precautions;Pain;Obesity  PT Treatment Interventions DME instruction;Gait training;Stair training;Functional mobility training;Therapeutic activities;Therapeutic exercise;Balance training;Neuromuscular re-education;Patient/family education;Modalities   PT Goals (Current goals can be found in the Care Plan section) Acute Rehab PT Goals Patient Stated Goal: Get better so he can walk and fish again PT Goal Formulation: With patient Time  For Goal Achievement: 10/04/13 Potential to Achieve Goals: Good    Frequency 5X/week   Barriers to discharge Decreased caregiver support Lives alone with no help available at any time    Co-evaluation               End of Session Equipment Utilized During Treatment: Gait belt;Cervical collar;Other (comment) ("[place]" orthosis) Activity Tolerance: Patient tolerated treatment well Patient left: in chair;with call bell/phone within reach Nurse Communication: Mobility status         Time: 6256-3893 PT Time Calculation (min): 44 min   Charges:   PT Evaluation $Initial PT Evaluation Tier I: 1 Procedure PT Treatments $Gait Training: 8-22 mins $Self Care/Home Management: 8-22   PT G Codes:         Elayne Snare, [place] 09/27/2013, 11:44 AM   Addendum for frequency - 5x/week

## 2013-09-27 NOTE — Progress Notes (Signed)
Patient ID: Travis [name], male   DOB: [birth year], 63 y.o.   MRN: [id number] TLS drain anterior neck SEWN IN please remove in am with suture removal tray. Lives alone and desires SNF post hospital for recovery.

## 2013-09-28 ENCOUNTER — Inpatient Hospital Stay (HOSPITAL_COMMUNITY): Payer: Medicare HMO

## 2013-09-28 LAB — URINALYSIS, ROUTINE W REFLEX MICROSCOPIC
Bilirubin Urine: NEGATIVE
Glucose, UA: NEGATIVE mg/dL
Ketones, ur: NEGATIVE mg/dL
Leukocytes, UA: NEGATIVE
Nitrite: NEGATIVE
Protein, ur: NEGATIVE mg/dL
Specific Gravity, Urine: 1.02 (ref 1.005–1.030)
Urobilinogen, UA: 1 mg/dL (ref 0.0–1.0)
pH: 6 (ref 5.0–8.0)

## 2013-09-28 LAB — URINE MICROSCOPIC-ADD ON

## 2013-09-28 LAB — GLUCOSE, CAPILLARY
GLUCOSE-CAPILLARY: 83 mg/dL (ref 70–99)
Glucose-Capillary: 111 mg/dL — ABNORMAL HIGH (ref 70–99)
Glucose-Capillary: 124 mg/dL — ABNORMAL HIGH (ref 70–99)
Glucose-Capillary: 135 mg/dL — ABNORMAL HIGH (ref 70–99)
Glucose-Capillary: 140 mg/dL — ABNORMAL HIGH (ref 70–99)

## 2013-09-28 NOTE — Progress Notes (Signed)
   Subjective:  Patient reports pain as improved.    Objective:   VITALS:   Filed Vitals:   09/27/13 2037 09/28/13 0513 09/28/13 0524 09/28/13 0649  BP: 120/61 81/42    Pulse: 74 87    Temp: 98 F (36.7 C) 102.2 F (39 C) 100.7 F (38.2 C) 98.7 F (37.1 C)  TempSrc: Oral Oral Oral Oral  Resp: 19 19    Height:      Weight:      SpO2: 94% 90%      Dressing c/d/i Sitting up in chair    Lab Results  Component Value Date   WBC 3.9* 09/24/2013   HGB 12.8* 09/24/2013   HCT 37.9* 09/24/2013   MCV 92.0 09/24/2013   PLT 157 09/24/2013     Assessment/Plan:  2 Days Post-Op   - Expected postop acute blood loss anemia - will monitor for symptoms - Up with PT/OT - DVT ppx - SCDs, ambulation, asa - UAT - Pain control - Discharge planning - lives alone and will need placement post dc from hospital, appreciate SW and CM help  Problem List Items Addressed This Visit   None       Travis [name] 09/28/2013, 10:19 AM [phone]

## 2013-09-28 NOTE — Clinical Social Work Placement (Addendum)
    Clinical Social Work Department CLINICAL SOCIAL WORK PLACEMENT NOTE 09/28/2013  Patient:  Travis [name], Travis [name]  Account Number:  [id number] Admit date:  09/26/2013  Clinical Social Worker:  Butch Penny CROWDER, LCSWA  Date/time:  09/28/2013 04:50 PM  Clinical Social Work is seeking post-discharge placement for this patient at the following level of care:   SKILLED NURSING   (*CSW will update this form in Epic as items are completed)   09/28/2013  Patient/family provided with [place] Department of Clinical Social Work's list of facilities offering this level of care within the geographic area requested by the patient (or if unable, by the patient's family).  09/28/2013  Patient/family informed of their freedom to choose among providers that offer the needed level of care, that participate in Medicare, Medicaid or managed care program needed by the patient, have an available bed and are willing to accept the patient.  09/28/2013  Patient/family informed of [facility]' ownership interest in [facility], as well as of the fact that they are under no obligation to receive care at this facility.  PASARR submitted to EDS on 09/27/2013 PASARR number received from EDS on 09/27/2013  FL2 transmitted to all facilities in geographic area requested by pt/family on  09/28/2013 FL2 transmitted to all facilities within larger geographic area on   Patient informed that his/her managed care company has contracts with or will negotiate with  certain facilities, including the following:   [facility]     Patient/family informed of bed offers received:   Patient chooses bed at [facility] Physician recommends and patient chooses bed at    Patient to be transferred to  on  10/01/13 Patient to be transferred to facility by Ambulance  Corey Harold)  The following physician request were entered in Epic:   Additional Comments:

## 2013-09-28 NOTE — Progress Notes (Signed)
Patient ambulated in the hall with 2 assist and walker around 75 feet.

## 2013-09-28 NOTE — Progress Notes (Signed)
Patient stated that he normally takes fish oil every evening 1200 mg and he has not been receiving it.  Please add to [facility] if he remains in the hospital.

## 2013-09-28 NOTE — Clinical Social Work Psychosocial (Signed)
     Clinical Social Work Department BRIEF PSYCHOSOCIAL ASSESSMENT 09/28/2013  Patient:  Travis [name], Travis [name]     Account Number:  [id number]     Admit date:  09/26/2013  Clinical Social Worker:  Iona Coach  Date/Time:  09/28/2013 04:42 PM  Referred by:  Physician  Date Referred:  09/28/2013 Referred for  SNF Placement   Other Referral:   Interview type:  Patient Other interview type:    PSYCHOSOCIAL DATA Living Status:  ALONE Admitted from facility:   Level of care:   Primary support name:  Kanan Sobek  [phone] Primary support relationship to patient:  [place] Degree of support available:   Good support    CURRENT CONCERNS Current Concerns  Post-Acute Placement   Other Concerns:    SOCIAL WORK ASSESSMENT / PLAN CSW met with patient to discuss recomendation by PT for short term SNF placement. Patient states that he is in full agreement to placement as he lives alone and does not feel that he can manage at this time. CSW provided SNF bed search list and discussed this process.  Patient does not have any placement preferences. Fl2 placed on chart for MD's signature.  Active bed search in place; patient will require [facility] auth prior to d/c.   Assessment/plan status:  Psychosocial Support/Ongoing Assessment of Needs Other assessment/ plan:   Information/referral to community resources:   SNF bed list provided to patient    PATIENTS/FAMILYS RESPONSE TO PLAN OF CARE: Patient is alert, oriented and responsive during visit re: his plan of care. He is requesting SNF placement as he states he cannot manage at home and has limited support from his family. Patient has a neck brace on and was having a difficult time coughing up secretions.  He requested some ice cream and CSW notified CNA who was outside of his room. Active bed search in place.  Will need to determine if patient is followed by [facility] for his [facility] authorization.  Patient was appreciative of  CSW's visist and denied any concerns at this time.    Lorie Phenix. [name], [place]

## 2013-09-28 NOTE — Progress Notes (Signed)
Patient's temperature 103, congestion and yellow sputum expectorated by patient. Called Dr. Erlinda Hong, order for chest xray. Sent urinalysis to rule out UTI. Patient's temperature at 1815 99.2. Patient;s blood pressure 94/47. Placed patient on NS @50  in attempt to increase B/P. Will continue to monitor.

## 2013-09-29 ENCOUNTER — Encounter (HOSPITAL_COMMUNITY): Payer: Self-pay | Admitting: Specialist

## 2013-09-29 DIAGNOSIS — N35919 Unspecified urethral stricture, male, unspecified site: Secondary | ICD-10-CM

## 2013-09-29 DIAGNOSIS — E669 Obesity, unspecified: Secondary | ICD-10-CM

## 2013-09-29 DIAGNOSIS — M542 Cervicalgia: Secondary | ICD-10-CM

## 2013-09-29 DIAGNOSIS — E1165 Type 2 diabetes mellitus with hyperglycemia: Secondary | ICD-10-CM

## 2013-09-29 DIAGNOSIS — R5082 Postprocedural fever: Secondary | ICD-10-CM

## 2013-09-29 DIAGNOSIS — M48062 Spinal stenosis, lumbar region with neurogenic claudication: Secondary | ICD-10-CM

## 2013-09-29 DIAGNOSIS — M7989 Other specified soft tissue disorders: Secondary | ICD-10-CM

## 2013-09-29 DIAGNOSIS — G4733 Obstructive sleep apnea (adult) (pediatric): Secondary | ICD-10-CM

## 2013-09-29 DIAGNOSIS — R509 Fever, unspecified: Secondary | ICD-10-CM

## 2013-09-29 DIAGNOSIS — IMO0001 Reserved for inherently not codable concepts without codable children: Secondary | ICD-10-CM

## 2013-09-29 LAB — GLUCOSE, CAPILLARY
GLUCOSE-CAPILLARY: 101 mg/dL — AB (ref 70–99)
GLUCOSE-CAPILLARY: 89 mg/dL (ref 70–99)
GLUCOSE-CAPILLARY: 109 mg/dL — AB (ref 70–99)
Glucose-Capillary: 105 mg/dL — ABNORMAL HIGH (ref 70–99)
Glucose-Capillary: 112 mg/dL — ABNORMAL HIGH (ref 70–99)
Glucose-Capillary: 114 mg/dL — ABNORMAL HIGH (ref 70–99)

## 2013-09-29 LAB — CBC WITH DIFFERENTIAL/PLATELET
BASOS ABS: 0 10*3/uL (ref 0.0–0.1)
BASOS PCT: 0 % (ref 0–1)
EOS ABS: 0.4 10*3/uL (ref 0.0–0.7)
EOS PCT: 7 % — AB (ref 0–5)
HCT: 33.9 % — ABNORMAL LOW (ref 39.0–52.0)
Hemoglobin: 11.3 g/dL — ABNORMAL LOW (ref 13.0–17.0)
Lymphocytes Relative: 16 % (ref 12–46)
Lymphs Abs: 0.8 10*3/uL (ref 0.7–4.0)
MCH: 31 pg (ref 26.0–34.0)
MCHC: 33.3 g/dL (ref 30.0–36.0)
MCV: 93.1 fL (ref 78.0–100.0)
MONO ABS: 0.7 10*3/uL (ref 0.1–1.0)
Monocytes Relative: 14 % — ABNORMAL HIGH (ref 3–12)
NEUTROS PCT: 63 % (ref 43–77)
Neutro Abs: 3.1 10*3/uL (ref 1.7–7.7)
PLATELETS: 156 10*3/uL (ref 150–400)
RBC: 3.64 MIL/uL — AB (ref 4.22–5.81)
RDW: 13.4 % (ref 11.5–15.5)
WBC: 4.8 10*3/uL (ref 4.0–10.5)

## 2013-09-29 LAB — COMPREHENSIVE METABOLIC PANEL
ALBUMIN: 3.3 g/dL — AB (ref 3.5–5.2)
ALT: 15 U/L (ref 0–53)
AST: 43 U/L — ABNORMAL HIGH (ref 0–37)
Alkaline Phosphatase: 41 U/L (ref 39–117)
BUN: 13 mg/dL (ref 6–23)
CALCIUM: 9 mg/dL (ref 8.4–10.5)
CO2: 28 mEq/L (ref 19–32)
CREATININE: 0.82 mg/dL (ref 0.50–1.35)
Chloride: 97 mEq/L (ref 96–112)
GFR calc Af Amer: >90 mL/min (ref >90)
GFR calc non Af Amer: >90 mL/min (ref >90)
Glucose, Bld: 110 mg/dL — ABNORMAL HIGH (ref 70–99)
Potassium: 4.5 mEq/L (ref 3.7–5.3)
Sodium: 136 mEq/L — ABNORMAL LOW (ref 137–147)
Total Bilirubin: 0.7 mg/dL (ref 0.3–1.2)
Total Protein: 6.3 g/dL (ref 6.0–8.3)

## 2013-09-29 LAB — PRO B NATRIURETIC PEPTIDE: Pro B Natriuretic peptide (BNP): 93.1 pg/mL (ref 0–125)

## 2013-09-29 MED ORDER — POLYETHYLENE GLYCOL 3350 17 G PO PACK
17.0000 g | PACK | Freq: Every day | ORAL | Status: DC
  Administered 2013-09-29 – 2013-09-30 (×2): 17 g via ORAL
  Filled 2013-09-29 (×2): qty 1

## 2013-09-29 MED ORDER — HYDROCODONE-ACETAMINOPHEN 5-325 MG PO TABS: 1.0000 | ORAL_TABLET | Freq: Four times a day (QID) | ORAL | Status: DC | PRN

## 2013-09-29 MED ORDER — CLONAZEPAM 0.5 MG PO TABS: 0.5000 mg | ORAL_TABLET | Freq: Two times a day (BID) | ORAL | Status: DC | PRN

## 2013-09-29 MED ORDER — GABAPENTIN 400 MG PO CAPS
400.0000 mg | ORAL_CAPSULE | Freq: Three times a day (TID) | ORAL | Status: DC
  Administered 2013-09-29 – 2013-10-01 (×5): 400 mg via ORAL
  Filled 2013-09-29 (×8): qty 1

## 2013-09-29 MED ORDER — DOXYCYCLINE HYCLATE 100 MG IV SOLR
100.0000 mg | Freq: Two times a day (BID) | INTRAVENOUS | Status: DC
  Administered 2013-09-29 – 2013-09-30 (×2): 100 mg via INTRAVENOUS
  Filled 2013-09-29 (×3): qty 100

## 2013-09-29 MED ORDER — MORPHINE SULFATE 2 MG/ML IJ SOLN
2.0000 mg | INTRAMUSCULAR | Status: DC | PRN
Start: 2013-09-29 — End: 2013-10-01

## 2013-09-29 MED ORDER — OXYCODONE HCL 5 MG PO TABS
15.0000 mg | ORAL_TABLET | ORAL | Status: DC | PRN
  Administered 2013-09-30 – 2013-10-01 (×7): 15 mg via ORAL
  Filled 2013-09-29 (×7): qty 3

## 2013-09-29 MED ORDER — TRAZODONE HCL 50 MG PO TABS
50.0000 mg | ORAL_TABLET | Freq: Every day | ORAL | Status: DC
  Administered 2013-09-29 – 2013-09-30 (×2): 50 mg via ORAL
  Filled 2013-09-29 (×3): qty 1

## 2013-09-29 MED FILL — Thrombin For Soln 20000 Unit: CUTANEOUS | Qty: 1 | Status: AC

## 2013-09-29 NOTE — Progress Notes (Signed)
Patient ID: Travis [name], male   DOB: [birth year], 63 y.o.   MRN: [id number] Subjective: 3 Days Post-Op Procedure(s) (LRB): ANTERIOR CERVICAL DISCECTOMY FUSION C3-4, plate and screw fixation, allograft bone graft (N/A) Awake, alert and oriented x 4. Congested, productive cough, positive history of bronchitis in the past. Spiked to temp of 103 yesterday. Arms are normal. Patient reports pain as mild.    Objective:   VITALS:  Temp:  [98.7 F (37.1 C)-103 F (39.4 C)] 99.9 F (37.7 C) (05/11 0424) Pulse Rate:  [75-97] 81 (05/11 0424) Resp:  [16-18] 16 (05/11 0424) BP: (91-130)/(47-64) 130/64 mmHg (05/11 0424) SpO2:  [92 %-97 %] 97 % (05/11 0424)  Neurologically intact ABD soft Intact pulses distally Incision: no drainage No cellulitis present CXR negative for acute findings.  LABS No results found for this basename: HGB, WBC, PLT,  in the last 72 hours  Recent Labs  09/27/13 0505  NA 141  K 4.0  CL 103  CO2 27  BUN 12  CREATININE 0.86  GLUCOSE 95   No results found for this basename: LABPT, INR,  in the last 72 hours   Assessment/Plan: 3 Days Post-Op Procedure(s) (LRB): ANTERIOR CERVICAL DISCECTOMY FUSION C3-4, plate and screw fixation, allograft bone graft (N/A)  Advance diet Up with therapy D/C IV fluids Discharge to SNF Due to elevated temp, productive cough and fluid retention will ask for [facility] to consult.  Jessy Oto 09/29/2013, 11:56 AM

## 2013-09-29 NOTE — Discharge Summary (Addendum)
Physician Discharge Summary  Patient ID: Travis [name] MRN: [id number] DOB/AGE: [birth year] 63 y.o.  Admit date: 09/26/2013 Discharge date:   Admission Diagnoses:  Principal Problem:   Spinal stenosis in cervical region Active Problems:   Acute urinary retention   Acute bronchitis   HYPERLIPIDEMIA-MIXED   HYPERTENSION   CORONARY ARTERY DISEASE   URETHRAL STRICTURE   ERECTILE DYSFUNCTION, ORGANIC   Type II or unspecified type diabetes mellitus without mention of complication, uncontrolled   Cervical stenosis of spinal canal   Postprocedural fever   Discharge Diagnoses:  Same  Past Medical History  Diagnosis Date  . Impaired glucose tolerance 09/27/2010  . HYPERLIPIDEMIA-MIXED 07/15/2009  . Chronic pain syndrome 10/27/2009  . HYPERTENSION 06/24/2009  . CORONARY ARTERY DISEASE 06/24/2009    a. s/p multiple PCIs - In 2008 he had a Taxus DES to the mild LAD, Endeavor DES to mid LCX and distal LCX. In January 2009 he had DES to distal LCX, mid LCX and proximal LCX. In November 2009 had BMS x 2 to the mid RCA. Cath 10/2011 with patent stents, noncardiac CP. LHC 01/2013: patent stents (noncardiac CP).  [name] URETHRAL STRICTURE 06/24/2009  . SHOULDER PAIN, BILATERAL 06/24/2009  . ANKLE PAIN, RIGHT 06/24/2009  . SCIATICA, LEFT 04/19/2010  . NEPHROLITHIASIS, HX OF 06/24/2009  . ERECTILE DYSFUNCTION, ORGANIC 05/30/2010  . Obesity   . Allergy to clopidogrel   . ALLERGIC RHINITIS 06/24/2009  . Anxiety   . Depression     Prior suicide attempt  . Irregular heart beat   . Pneumonia   . Self-catheterizes urinary bladder   . Anginal pain   . Myocardial infarction 1998  . OSA (obstructive sleep apnea)     not using CPAP (09/30/2013)  . Type II diabetes mellitus   . Hepatitis C dx'd 1979  . DEGENERATIVE JOINT DISEASE 06/24/2009  . [place] DISEASE, LUMBAR 04/19/2010  . Arthritis     "all my joints" (09/30/2013)  . Fibromyalgia     "left leg" (09/30/2013)  . Chronic back pain     "my whole back" (09/30/2013)   . Kidney stones     "passed on my own"    Surgeries: Procedure(s): ANTERIOR CERVICAL DISCECTOMY FUSION C3-4, plate and screw fixation, allograft bone graft on 09/26/2013   Consultants:    Discharged Condition: Improved  Hospital Course: Travis [name] is an 63 y.o. male who was admitted 09/26/2013 with a chief complaint of No chief complaint on file. and found to have a diagnosis of Spinal stenosis in cervical region.  They were brought to the operating room on 09/26/2013 and underwent the above named procedures.    They were given perioperative antibiotics:      Anti-infectives   Start     Dose/Rate Route Frequency Ordered Stop   10/01/13 0000  doxycycline (VIBRA-TABS) 100 MG tablet     100 mg Oral Every 12 hours 10/01/13 0844     09/30/13 1800  doxycycline (VIBRA-TABS) tablet 100 mg     100 mg Oral Every 12 hours 09/30/13 0843     09/29/13 1500  doxycycline (VIBRAMYCIN) 100 mg in dextrose 5 % 250 mL IVPB  Status:  Discontinued     100 mg 125 mL/hr over 120 Minutes Intravenous Every 12 hours 09/29/13 1342 09/30/13 0843   09/26/13 1700  ceFAZolin (ANCEF) IVPB 1 g/50 mL premix     1 g 100 mL/hr over 30 Minutes Intravenous Every 8 hours 09/26/13 1652 09/27/13 0229   09/26/13 0600  ceFAZolin (ANCEF)  3 g in dextrose 5 % 50 mL IVPB     3 g 160 mL/hr over 30 Minutes Intravenous On call to O.R. 09/25/13 1355 09/26/13 1340    POD #1 foley was discontinued, TLS drain left neck was removed by Dr. Erlinda Hong. Awake, alert and oriented x4. He lives alone at home so Post hospitalization he prefers to go to a SNF for recuperation to ensure 24/7 care during initial phase of his recuperation.POD#2 tolerating Po medications and po diet.Spiked temperature to 103 degrees. CXR negative, UA equivocal for UTI 3-6 WBC with several epithelial cells. Fever Responded to pulmonary toilet. POD#3 productive cough, incision dry without erythrema. Bilateral lower extremities with significant pitting edema. [facility] consult requested. Blood sugars 105.Social service consulted for SNF placement post hospitalization.Dr. Candiss Norse saw patient for [facility] and determined the patient to have acute urinary retention and acute bronchitis. Due to leg swelling doppler ultra sound was done That returned negative for DVT. BNP was 91. WBC and CBGs were within acceptable range. CMET normal. He was started on IV vibramycin and The transitioned to oral doxycycline 100 mg BID. On POD #4 temperatures 99-100, VSS and cough was improving. Metformin oral hypoglycemic med restarted. Discharge planning was undertaken And he underwent SNF placement. SNF was scheduled for POD# 5 10/01/2013. The patient may be transferred to the SNF via regular vehicle. Foley catheter will remain in place for 1-2 weeks to follow up with Dr. Janice Norrie urology for a voiding trial. Will use a leg bag during the day and A foley resevoir at night until followed up with Dr. Janice Norrie.  They were given sequential compression devices, early ambulation, and chemoprophylaxis for DVT prophylaxis.  They benefited maximally from their hospital stay and there were no complications.    Recent vital signs:  Filed Vitals:   10/01/13 0551  BP: 125/62  Pulse: 88  Temp: 100.3 F (37.9 C)  Resp: 18    Recent laboratory studies:  Results for orders placed during the hospital encounter of 09/26/13  CULTURE, BLOOD (ROUTINE X 2)      Result Value Ref Range   Specimen Description BLOOD LEFT ANTECUBITAL     Special Requests       Value: BOTTLES DRAWN AEROBIC AND ANAEROBIC 10CC BLUE, 5CC RED   Culture  Setup Time       Value: 09/29/2013 16:29     Performed at [facility]   Culture       Value:        BLOOD CULTURE RECEIVED NO GROWTH TO DATE CULTURE WILL BE HELD FOR 5 DAYS BEFORE ISSUING A FINAL NEGATIVE REPORT     Performed at [facility]   Report Status PENDING    CULTURE, BLOOD (ROUTINE X 2)      Result Value Ref Range   Specimen  Description BLOOD LEFT HAND     Special Requests BOTTLES DRAWN AEROBIC ONLY 5CC     Culture  Setup Time       Value: 09/29/2013 16:28     Performed at [facility]   Culture       Value:        BLOOD CULTURE RECEIVED NO GROWTH TO DATE CULTURE WILL BE HELD FOR 5 DAYS BEFORE ISSUING A FINAL NEGATIVE REPORT     Performed at [facility]   Report Status PENDING    GLUCOSE, CAPILLARY      Result Value Ref Range   Glucose-Capillary 103 (*) 70 -  99 mg/dL  GLUCOSE, CAPILLARY      Result Value Ref Range   Glucose-Capillary 114 (*) 70 - 99 mg/dL  GLUCOSE, CAPILLARY      Result Value Ref Range   Glucose-Capillary 112 (*) 70 - 99 mg/dL  GLUCOSE, CAPILLARY      Result Value Ref Range   Glucose-Capillary 130 (*) 70 - 99 mg/dL   Comment 1 Documented in Chart     Comment 2 Notify RN    HEMOGLOBIN A1C      Result Value Ref Range   Hemoglobin A1C 5.6  <5.7 %   Mean Plasma Glucose 114  <117 mg/dL  BASIC METABOLIC PANEL      Result Value Ref Range   Sodium 141  137 - 147 mEq/L   Potassium 4.0  3.7 - 5.3 mEq/L   Chloride 103  96 - 112 mEq/L   CO2 27  19 - 32 mEq/L   Glucose, Bld 95  70 - 99 mg/dL   BUN 12  6 - 23 mg/dL   Creatinine, Ser 0.86  0.50 - 1.35 mg/dL   Calcium 8.5  8.4 - 10.5 mg/dL   GFR calc non Af Amer >90  >90 mL/min   GFR calc Af Amer >90  >90 mL/min  GLUCOSE, CAPILLARY      Result Value Ref Range   Glucose-Capillary 103 (*) 70 - 99 mg/dL  GLUCOSE, CAPILLARY      Result Value Ref Range   Glucose-Capillary 98  70 - 99 mg/dL  GLUCOSE, CAPILLARY      Result Value Ref Range   Glucose-Capillary 101 (*) 70 - 99 mg/dL  GLUCOSE, CAPILLARY      Result Value Ref Range   Glucose-Capillary 104 (*) 70 - 99 mg/dL  GLUCOSE, CAPILLARY      Result Value Ref Range   Glucose-Capillary 86  70 - 99 mg/dL  GLUCOSE, CAPILLARY      Result Value Ref Range   Glucose-Capillary 130 (*) 70 - 99 mg/dL  GLUCOSE, CAPILLARY      Result Value Ref Range   Glucose-Capillary 99  70 -  99 mg/dL   Comment 1 Documented in Chart     Comment 2 Notify RN    GLUCOSE, CAPILLARY      Result Value Ref Range   Glucose-Capillary 97  70 - 99 mg/dL  GLUCOSE, CAPILLARY      Result Value Ref Range   Glucose-Capillary 114 (*) 70 - 99 mg/dL   Comment 1 Documented in Chart     Comment 2 Notify RN    GLUCOSE, CAPILLARY      Result Value Ref Range   Glucose-Capillary 111 (*) 70 - 99 mg/dL   Comment 1 Notify RN    GLUCOSE, CAPILLARY      Result Value Ref Range   Glucose-Capillary 124 (*) 70 - 99 mg/dL  GLUCOSE, CAPILLARY      Result Value Ref Range   Glucose-Capillary 83  70 - 99 mg/dL  URINALYSIS, ROUTINE W REFLEX MICROSCOPIC      Result Value Ref Range   Color, Urine YELLOW  YELLOW   APPearance CLEAR  CLEAR   Specific Gravity, Urine 1.020  1.005 - 1.030   pH 6.0  5.0 - 8.0   Glucose, UA NEGATIVE  NEGATIVE mg/dL   Hgb urine dipstick TRACE (*) NEGATIVE   Bilirubin Urine NEGATIVE  NEGATIVE   Ketones, ur NEGATIVE  NEGATIVE mg/dL   Protein, ur NEGATIVE  NEGATIVE mg/dL  Urobilinogen, UA 1.0  0.0 - 1.0 mg/dL   Nitrite NEGATIVE  NEGATIVE   Leukocytes, UA NEGATIVE  NEGATIVE  GLUCOSE, CAPILLARY      Result Value Ref Range   Glucose-Capillary 135 (*) 70 - 99 mg/dL   Comment 1 Documented in Chart     Comment 2 Notify RN    URINE MICROSCOPIC-ADD ON      Result Value Ref Range   Squamous Epithelial / LPF MANY (*) RARE   WBC, UA 3-6  <3 WBC/hpf  GLUCOSE, CAPILLARY      Result Value Ref Range   Glucose-Capillary 140 (*) 70 - 99 mg/dL  GLUCOSE, CAPILLARY      Result Value Ref Range   Glucose-Capillary 114 (*) 70 - 99 mg/dL  GLUCOSE, CAPILLARY      Result Value Ref Range   Glucose-Capillary 101 (*) 70 - 99 mg/dL  GLUCOSE, CAPILLARY      Result Value Ref Range   Glucose-Capillary 89  70 - 99 mg/dL  PRO B NATRIURETIC PEPTIDE      Result Value Ref Range   Pro B Natriuretic peptide (BNP) 93.1  0 - 125 pg/mL  CBC WITH DIFFERENTIAL      Result Value Ref Range   WBC 4.8  4.0 -  10.5 K/uL   RBC 3.64 (*) 4.22 - 5.81 MIL/uL   Hemoglobin 11.3 (*) 13.0 - 17.0 g/dL   HCT 33.9 (*) 39.0 - 52.0 %   MCV 93.1  78.0 - 100.0 fL   MCH 31.0  26.0 - 34.0 pg   MCHC 33.3  30.0 - 36.0 g/dL   RDW 13.4  11.5 - 15.5 %   Platelets 156  150 - 400 K/uL   Neutrophils Relative % 63  43 - 77 %   Neutro Abs 3.1  1.7 - 7.7 K/uL   Lymphocytes Relative 16  12 - 46 %   Lymphs Abs 0.8  0.7 - 4.0 K/uL   Monocytes Relative 14 (*) 3 - 12 %   Monocytes Absolute 0.7  0.1 - 1.0 K/uL   Eosinophils Relative 7 (*) 0 - 5 %   Eosinophils Absolute 0.4  0.0 - 0.7 K/uL   Basophils Relative 0  0 - 1 %   Basophils Absolute 0.0  0.0 - 0.1 K/uL  COMPREHENSIVE METABOLIC PANEL      Result Value Ref Range   Sodium 136 (*) 137 - 147 mEq/L   Potassium 4.5  3.7 - 5.3 mEq/L   Chloride 97  96 - 112 mEq/L   CO2 28  19 - 32 mEq/L   Glucose, Bld 110 (*) 70 - 99 mg/dL   BUN 13  6 - 23 mg/dL   Creatinine, Ser 0.82  0.50 - 1.35 mg/dL   Calcium 9.0  8.4 - 10.5 mg/dL   Total Protein 6.3  6.0 - 8.3 g/dL   Albumin 3.3 (*) 3.5 - 5.2 g/dL   AST 43 (*) 0 - 37 U/L   ALT 15  0 - 53 U/L   Alkaline Phosphatase 41  39 - 117 U/L   Total Bilirubin 0.7  0.3 - 1.2 mg/dL   GFR calc non Af Amer >90  >90 mL/min   GFR calc Af Amer >90  >90 mL/min  GLUCOSE, CAPILLARY      Result Value Ref Range   Glucose-Capillary 105 (*) 70 - 99 mg/dL  GLUCOSE, CAPILLARY      Result Value Ref Range   Glucose-Capillary 109 (*) 70 -  99 mg/dL  GLUCOSE, CAPILLARY      Result Value Ref Range   Glucose-Capillary 112 (*) 70 - 99 mg/dL  GLUCOSE, CAPILLARY      Result Value Ref Range   Glucose-Capillary 111 (*) 70 - 99 mg/dL  GLUCOSE, CAPILLARY      Result Value Ref Range   Glucose-Capillary 113 (*) 70 - 99 mg/dL  GLUCOSE, CAPILLARY      Result Value Ref Range   Glucose-Capillary 147 (*) 70 - 99 mg/dL  GLUCOSE, CAPILLARY      Result Value Ref Range   Glucose-Capillary 109 (*) 70 - 99 mg/dL   Comment 1 Documented in Chart     Comment 2 Notify  RN    GLUCOSE, CAPILLARY      Result Value Ref Range   Glucose-Capillary 124 (*) 70 - 99 mg/dL   Comment 1 Documented in Chart     Comment 2 Notify RN    GLUCOSE, CAPILLARY      Result Value Ref Range   Glucose-Capillary 116 (*) 70 - 99 mg/dL  GLUCOSE, CAPILLARY      Result Value Ref Range   Glucose-Capillary 123 (*) 70 - 99 mg/dL    Discharge Medications:     Medication List    STOP taking these medications       indomethacin 25 MG capsule  Commonly known as:  INDOCIN      TAKE these medications       amLODipine 5 MG tablet  Commonly known as:  NORVASC  Take 5 mg by mouth daily.     ARIPiprazole 5 MG tablet  Commonly known as:  ABILIFY  Take 5 mg by mouth daily.     aspirin 81 MG tablet  Take 1 tablet (81 mg total) by mouth daily.     atorvastatin 20 MG tablet  Commonly known as:  LIPITOR  Take 20 mg by mouth daily.     benazepril 20 MG tablet  Commonly known as:  LOTENSIN  Take 20 mg by mouth daily.     bisacodyl 10 MG suppository  Commonly known as:  DULCOLAX  Place 1 suppository (10 mg total) rectally daily as needed for moderate constipation.     clonazePAM 2 MG tablet  Commonly known as:  KLONOPIN  Take 2 mg by mouth 2 (two) times daily as needed for anxiety (anxiety).     cyclobenzaprine 5 MG tablet  Commonly known as:  FLEXERIL  Take 5 mg by mouth 3 (three) times daily as needed for muscle spasms.     cyclobenzaprine 5 MG tablet  Commonly known as:  FLEXERIL  Take 1 tablet (5 mg total) by mouth 3 (three) times daily as needed for muscle spasms.     diclofenac sodium 1 % Gel  Commonly known as:  VOLTAREN  Apply 2 g topically 4 (four) times daily as needed. For pain     divalproex 250 MG DR tablet  Commonly known as:  DEPAKOTE  Take 250-500 mg by mouth 2 (two) times daily. 250 mg in morning and 500 mg at bedtime     doxycycline 100 MG tablet  Commonly known as:  VIBRA-TABS  Take 1 tablet (100 mg total) by mouth every 12 (twelve) hours.      DSS 100 MG Caps  Take 100 mg by mouth 2 (two) times daily.     FLUoxetine 40 MG capsule  Commonly known as:  PROZAC  Take 40 mg by mouth daily.  fluticasone 50 MCG/ACT nasal spray  Commonly known as:  FLONASE  Place 1 spray into both nostrils daily.     gabapentin 400 MG capsule  Commonly known as:  NEURONTIN  Take 800 mg by mouth 4 (four) times daily.     isosorbide mononitrate 60 MG 24 hr tablet  Commonly known as:  IMDUR  Take 90 mg by mouth daily.     lidocaine 5 %  Commonly known as:  LIDODERM  Place 1 patch onto the skin daily as needed. Remove & Discard patch within 12 hours or as directed by MD. For  Pain.     metFORMIN 500 MG tablet  Commonly known as:  GLUCOPHAGE  Take by mouth 2 (two) times daily with a meal.     oxyCODONE 15 MG immediate release tablet  Commonly known as:  ROXICODONE  Take 1 tablet (15 mg total) by mouth every 4 (four) hours as needed for severe pain.     prasugrel 10 MG Tabs tablet  Commonly known as:  EFFIENT  Take 10 mg by mouth daily.     sildenafil 50 MG tablet  Commonly known as:  VIAGRA  Take 1 tablet (50 mg total) by mouth daily as needed for erectile dysfunction.     tamsulosin 0.4 MG Caps capsule  Commonly known as:  FLOMAX  Take 1 capsule (0.4 mg total) by mouth daily after breakfast.     traZODone 100 MG tablet  Commonly known as:  DESYREL  Take 100 mg by mouth at bedtime.        Diagnostic Studies: Dg Chest 2 View  09/28/2013   CLINICAL DATA:  Fever.  Productive cough.  EXAM: CHEST  2 VIEW  COMPARISON:  09/02/2013  FINDINGS: The heart size and mediastinal contours are within normal limits. Both lungs are clear. The visualized skeletal structures are unremarkable.  IMPRESSION: No active cardiopulmonary disease.   Electronically Signed   By: Earle Gell M.D.   On: 09/28/2013 15:09   Dg Chest 2 View  09/02/2013   CLINICAL DATA:  Chest pain  EXAM: CHEST  2 VIEW  COMPARISON:  05/03/2013  FINDINGS: Cardiac shadow is  stable. The lungs are well aerated bilaterally. Changes of prior coronary stenting are noted. No acute infiltrate or sizable effusion is seen.  IMPRESSION: No acute abnormality noted.   Electronically Signed   By: Inez Catalina M.D.   On: 09/02/2013 15:54   Dg Cervical Spine 2-3 Views  09/26/2013   CLINICAL DATA:  63 year old male undergoing spine surgery. Initial encounter.  EXAM: CERVICAL SPINE - 2-3 VIEW  COMPARISON:  Cervical spine MRI 09/12/2013.  FINDINGS: Intraoperative portable cross-table lateral views of the cervical spine.  Film labeled #1 at 1347 hrs. Needle directed at the C3-C4 disc space.  Film labeled found to at 1458 hrs.  C3-C4 ACDF hardware in place.  IMPRESSION: C3-C4 ACDF depicted.   Electronically Signed   By: Lars Pinks M.D.   On: 09/26/2013 15:25   Ct Head Wo Contrast  09/02/2013   CLINICAL DATA:  Head and neck pain.  Unable to walk.  EXAM: CT HEAD WITHOUT CONTRAST  CT CERVICAL SPINE WITHOUT CONTRAST  TECHNIQUE: Multidetector CT imaging of the head and cervical spine was performed following the standard protocol without intravenous contrast. Multiplanar CT image reconstructions of the cervical spine were also generated.  COMPARISON:  MRI 03/21/2013.  MRI 12/03/2009.  FINDINGS: CT HEAD FINDINGS  The brain has a normal appearance without evidence of atrophy,  old or acute infarction, mass lesion, hemorrhage, hydrocephalus or extra-axial collection. No calvarial abnormality. No significant sinus disease.  CT CERVICAL SPINE FINDINGS  Alignment is normal.  No evidence of fracture.  There is ordinary osteoarthritis of the C1-2 articulation.  C2-3: Facet degeneration on the left. Mild foraminal narrowing on the left.  C3-4: Facet arthropathy right worse than left. Spondylosis with shallow protrusion of disc material and uncovertebral hypertrophy. Foraminal stenosis bilaterally left worse than right.  C4-5: Facet degeneration on the left. No canal or foraminal stenosis.  C5-6: Facet degeneration on  the left. 1 mm of anterolisthesis. Mild foraminal narrowing on the left.  C6-7: Spondylosis with endplate osteophytes. Mild narrowing of the canal, not pronounced.  C7-T1: Bilateral facet arthropathy with 2 mm of anterolisthesis. No canal stenosis. Foraminal narrowing left more than right.  IMPRESSION: Head CT:  Negative  Cervical spine CT: Chronic degenerative facet arthropathy and degenerative spondylosis. By CT, there does not appear to be any compromise of the spinal canal. There is foraminal narrowing at multiple levels that could result in cervical radicular symptoms.   Electronically Signed   By: Nelson Chimes M.D.   On: 09/02/2013 15:51   Ct Soft Tissue Neck W Contrast  09/02/2013   CLINICAL DATA:  Fever  EXAM: CT NECK WITH CONTRAST  TECHNIQUE: Multidetector CT imaging of the neck was performed using the standard protocol following the bolus administration of intravenous contrast.  CONTRAST:  13m OMNIPAQUE IOHEXOL 300 MG/ML  SOLN  COMPARISON:  CT HEAD W/O CM dated 09/02/2013  FINDINGS: No retropharyngeal abscess. Multilevel cervical spondylosis and degenerative disc disease. Chronic right maxillary sinusitis.  Mild prominence of the adenoidal tissue and mildly prominent palatine tonsils. Lingual tonsils are prominent, right greater than left.  No pathologic adenopathy in the neck. Salivary glands appear symmetric.  Dominant right vertebral artery. Main vascular structures in the neck are patent.  Thyroid gland unremarkable.  Glottic structures appear symmetric.  IMPRESSION: 1. Slight adenoidal, lingual tonsillar, and palatine tonsillar prominence, likely reactive. The airway does not appear particularly threatened. No pathologic adenopathy. 2. The lingual tonsillar hypertrophy is somewhat asymmetric, with the right larger than the left. It may be prudent to follow-up with direct visualization of the lingual tonsils in order to ensure resolution of presumably reactive adenopathy and exclude the unlikely  possibility of a lingual tonsillar mass. 3. Cervical spondylosis and degenerative disc disease, with osseous foraminal narrowing most striking on the left at C3-4. 4. Chronic right maxillary sinusitis.   Electronically Signed   By: WSherryl BartersM.D.   On: 09/02/2013 21:51   Ct Cervical Spine Wo Contrast  09/02/2013   CLINICAL DATA:  Head and neck pain.  Unable to walk.  EXAM: CT HEAD WITHOUT CONTRAST  CT CERVICAL SPINE WITHOUT CONTRAST  TECHNIQUE: Multidetector CT imaging of the head and cervical spine was performed following the standard protocol without intravenous contrast. Multiplanar CT image reconstructions of the cervical spine were also generated.  COMPARISON:  MRI 03/21/2013.  MRI 12/03/2009.  FINDINGS: CT HEAD FINDINGS  The brain has a normal appearance without evidence of atrophy, old or acute infarction, mass lesion, hemorrhage, hydrocephalus or extra-axial collection. No calvarial abnormality. No significant sinus disease.  CT CERVICAL SPINE FINDINGS  Alignment is normal.  No evidence of fracture.  There is ordinary osteoarthritis of the C1-2 articulation.  C2-3: Facet degeneration on the left. Mild foraminal narrowing on the left.  C3-4: Facet arthropathy right worse than left. Spondylosis with shallow protrusion of disc  material and uncovertebral hypertrophy. Foraminal stenosis bilaterally left worse than right.  C4-5: Facet degeneration on the left. No canal or foraminal stenosis.  C5-6: Facet degeneration on the left. 1 mm of anterolisthesis. Mild foraminal narrowing on the left.  C6-7: Spondylosis with endplate osteophytes. Mild narrowing of the canal, not pronounced.  C7-T1: Bilateral facet arthropathy with 2 mm of anterolisthesis. No canal stenosis. Foraminal narrowing left more than right.  IMPRESSION: Head CT:  Negative  Cervical spine CT: Chronic degenerative facet arthropathy and degenerative spondylosis. By CT, there does not appear to be any compromise of the spinal canal. There is  foraminal narrowing at multiple levels that could result in cervical radicular symptoms.   Electronically Signed   By: Nelson Chimes M.D.   On: 09/02/2013 15:51   Mr Cervical Spine Wo Contrast  09/03/2013   CLINICAL DATA:  Acute neck pain. History degenerative joint disease. Difficulty walking and standing.  EXAM: MRI CERVICAL SPINE WITHOUT CONTRAST  TECHNIQUE: Multiplanar, multisequence MR imaging was performed. No intravenous contrast was administered.  COMPARISON:  MRI of the cervical spine 12/03/2009.  FINDINGS: Normal signal is present in the cervical and upper thoracic spinal cord the lowest imaged level. Craniocervical junction is within normal limits. The sphenoid sinuses are opacified. The visualized intracranial contents are normal.  C2-3: Mild left foraminal narrowing is due to uncovertebral and facet spurring. The central canal is patent.  C3-4: A broad-based disc osteophyte complex has progressed. There is contact and distortion of the left ventral surface of the cord. Moderate foraminal stenosis is present bilaterally.  C4-5: A mild broad-based disc osteophyte complex is present. Uncovertebral and facet spurring has progressed with mild to moderate foraminal narrowing bilaterally.  C5-6: Slight disc bulging is present without significant stenosis or change.  C6-7: A progressive broad-based disc osteophyte complex is asymmetric to the right. Mild central and right foraminal narrowing is present.  C7-T1: Asymmetric right-sided facet hypertrophy is present. There is no significant stenosis.  IMPRESSION: 1. Progressive broad-based disc osteophyte, plexus and left paramedian protrusion at C3-4 results and moderate central canal stenosis with contact and distortion of the left ventral surface of the cord but no definite abnormal signal. 2. Moderate foraminal stenosis bilaterally at C3-4. 3. Progressive uncovertebral and facet spurring at C4-5 with mild to moderate foraminal stenosis bilaterally. 4.  Progressive broad-based disc osteophyte complex with no mild central and right foraminal stenosis at C6-7. 5. Progressive asymmetric right-sided facet hypertrophy at C7-T1 without significant stenosis.   Electronically Signed   By: Lawrence Santiago M.D.   On: 09/03/2013 18:09   Mr Lumbar Spine Wo Contrast  09/03/2013   CLINICAL DATA:  Low back pain and bilateral leg weakness.  EXAM: MRI LUMBAR SPINE WITHOUT CONTRAST  TECHNIQUE: Multiplanar, multisequence MR imaging was performed. No intravenous contrast was administered.  COMPARISON:  MRI lumbar spine 07/24/2013 at [facility].  FINDINGS: Normal signal is present in the conus medullaris which terminates at L1-2, within normal limits. Marrow signal is normal. Schmorl's nodes are present along the superior endplate of L3. Dextro convex scoliosis is centered at L3.  L1-2: A a right lateral disc protrusion and asymmetric right-sided facet hypertrophy is stable.  L2-3: A broad-based disc herniation is asymmetric to the left. Facet hypertrophy is asymmetric on the left. Mild left lateral recess narrowing is stable.  L3-4: A broad-based disc herniation is present. Moderate facet hypertrophy is noted bilaterally. Mild lateral recess narrowing bilaterally is stable, worse on the left. Mild left foraminal narrowing  is stable as well.  L4-5: This is again the most severe level. A rightward disc herniation is present. Advanced facet hypertrophy is present bilaterally. Moderate central canal stenosis is evident with right greater left lateral recess narrowing. Moderate foraminal stenosis is worse on the left.  L5-S1: There is chronic loss of disc height. Mild facet hypertrophy is present. There is no significant stenosis.  IMPRESSION: 1. No significant interval change. 2. Most severe level is again noted to the L4-5 with moderate central canal stenosis and right greater than left lateral recess narrowing. 3. Moderate foraminal stenosis is worse on the  left L4-5.   Electronically Signed   By: Lawrence Santiago M.D.   On: 09/03/2013 18:35   Dg Lumbar Puncture Fluoro Guide  09/03/2013   CLINICAL DATA:  Fever, stiff neck, possible meningitis  EXAM: LUMBAR PUNCTURE UNDER FLUOROSCOPY  FLUOROSCOPY TIME:  9 seconds  TECHNIQUE: The procedure, risks (including but not limited to bleeding, infection, organ damage ), benefits, and alternatives were explained to the patient. Questions regarding the procedure were encouraged and answered. The patient understands and consents to the procedure. An appropriate skin entry site was determined fluoroscopically. Operator donned sterile gloves and mask. Skin site was marked, then prepped with Betadine, draped in usual sterile fashion, and infiltrated locally with 1% lidocaine. A 20 gauge spinal needle advanced into the thecal sac at L3 from a right interlaminar approach. Clear colorless CSF spontaneously returned, with a corrected opening pressure of 16 cm water. 4m CSF were collected and divided among 4 sterile vials for the requested laboratory studies. The needle was then removed. No immediate complication.  IMPRESSION: 1. Technically successful lumbar puncture under fluoroscopy.   Electronically Signed   By: DArne ClevelandM.D.   On: 09/03/2013 08:47    Disposition: 01-Home or Self Care  Discharge Orders   Future Appointments Provider Department Dept Phone   10/01/2013 1:45 PM JBiagio Borg MD [phone]  Future Orders Complete By Expires   Call MD / Call 911  As directed    Constipation Prevention  As directed    Diet - low sodium heart healthy  As directed    Discharge instructions  As directed    Driving restrictions  As directed    Increase activity slowly as tolerated  As directed    Lifting restrictions  As directed       Follow-up Information   Follow up with NITKA,JAMES E, MD In 2 weeks. (For wound re-check)    Specialty:  Orthopedic Surgery   Contact  information:   [address] [phone]      Follow up with NArvil Persons MD In 2 weeks. (For voiding trial.)    Specialty:  Urology   Contact information:   [phone]       Signed: JJessy Oto5/13/2015, 10:15 AM

## 2013-09-29 NOTE — Progress Notes (Signed)
*  PRELIMINARY RESULTS* Vascular Ultrasound Bilateral lower extremity venous duplex has been completed.  Preliminary findings: Bilateral:  No evidence of DVT, superficial thrombosis, or Baker's Cyst.    Travis [name] F Heike Pounds 09/29/2013, 7:00 PM

## 2013-09-29 NOTE — Progress Notes (Signed)
Bladder scan performed with results of 0 cc found, confirmed by second staff member,pt does complain of bladder tenderness, Dr. Candiss Norse aware of results, patient is confused,thinks he is in [place],  MD aware of confusion, not able to maintain awareness and follow directions, order to insert foley and leave in if over 300 cc in bladder, patient given IS and educated via teachback of use, able to perform 2000 on IS but lots of expiratory rhonchi, Hazle Nordmann RN

## 2013-09-29 NOTE — Significant Event (Addendum)
#  74 French foley catheter placed with [employer] CNA as second person for insertion, patient assisted to bed and catheter placed using sterile technique and without difficulty, 1000cc clear light yellow urine immediate return, secured with leg strap, clamped at 1000 for 1 hour per protocol, Dr. Candiss Norse notified of same,bed alarm set at middle level due to patient being disoriented, forgetful, Hazle Nordmann RN

## 2013-09-29 NOTE — Progress Notes (Signed)
Occupational Therapy Treatment Patient Details Name: Travis [name] MRN: [id number] DOB: [birth year] Today's Date: 09/29/2013    History of present illness 63 y.o. male s/p ANTERIOR CERVICAL DISCECTOMY FUSION C3-4. Hx of HTN, CAD, and bilateral shoulder pain.   OT comments  Pt. Was lethargic today and kept falling asleep during tx. Pt. Requires multiple demo on how to use AE for LE dressing. Pt. Required Mod A to use AE correctly and appears to be confused on use of items immediately after demo. Pt. Requires further skilled OT to maximize performance with ADLs and mobility.   Follow Up Recommendations  SNF    Equipment Recommendations       Recommendations for Other Services      Precautions / Restrictions Precautions Precautions: Cervical;Fall Precaution Comments: History of falls Required Braces or Orthoses: Cervical Brace;Other Brace/Splint Cervical Brace: Soft collar Other Brace/Splint: "[place]" orthosis for ambulation on RLE Restrictions Weight Bearing Restrictions: No       Mobility Bed Mobility                  Transfers Overall transfer level: Needs assistance Equipment used: Rolling walker (2 wheeled) Transfers: Sit to/from Stand Sit to Stand: Min assist         General transfer comment: Min assist for RW control. Verbal cues for hand placement. Requires extra time. Performed from recliner and BSC. Pt used handrails in bathroom to assist with stand.    Balance                                   ADL                       Lower Body Dressing: Moderate assistance;Sit to/from stand                 General ADL Comments: ed. pt. on use of AE for LE dressing. Pt. requires max cues and A to perform.      Vision                     Perception     Praxis      Cognition   Behavior During Therapy: [facility] for tasks assessed/performed Overall Cognitive Status: Impaired/Different from baseline Area of Impairment:  Attention;Memory;Following commands;Problem solving   Current Attention Level: Selective Memory: Decreased short-term memory  Following Commands: Follows multi-step commands inconsistently   Awareness: Anticipatory Problem Solving: Slow processing General Comments: Pt seemed to grow increasingly confused as therapy progressed today. Was found trying to ambulate to restroom but stated he got stuck and forgot what he was doing. He was becoming flat with his affect compared to the interactions we have had previously. Asked several times if his phose was ringing. Admitted he was feeling somewhat "off" towards end of therapy. Was asking questions out of context of the conversation we were having.    Extremity/Trunk Assessment               Exercises     Shoulder Instructions       General Comments      Pertinent Vitals/ Pain       No c/o pain  Home Living  Prior Functioning/Environment              Frequency       Progress Toward Goals  OT Goals(current goals can now be found in the care plan section)  Progress towards OT goals: Progressing toward goals  Acute Rehab OT Goals Patient Stated Goal: Get better so he can walk and fish again  Plan Discharge plan remains appropriate    Co-evaluation                 End of Session     Activity Tolerance Patient tolerated treatment well   Patient Left in chair;with call bell/phone within reach;with nursing/sitter in room   Nurse Communication  (nurse OK'ed therapy.)        Time: 7183-6725 OT Time Calculation (min): 43 min  Charges: OT General Charges $OT Visit: 1 Procedure OT Treatments $Self Care/Home Management : 38-52 mins  Audry Pili 09/29/2013, 2:37 PM

## 2013-09-29 NOTE — Progress Notes (Signed)
Physical Therapy Treatment Patient Details Name: Travis [name] MRN: [id number] DOB: [birth year] Today's Date: 09/29/2013    History of Present Illness 63 y.o. male s/p ANTERIOR CERVICAL DISCECTOMY FUSION C3-4. Hx of HTN, CAD, and bilateral shoulder pain.    PT Comments    Pt seemed to grow increasingly confused throughout therapy session. He is with lower extremity swelling bilaterally (R>L) and states that he is feeling a little "off" today. Was found ambulating in his room without assistance but was not moving while he stood at the bathroom door, stating that he was "stuck."  Nurse notified. Ambulatory distance decreased from previous session, pt reports fatigue. Transfers and gait mechanics slowly improving. Pt will benefit from continued skilled PT services to increase level of independence with functional mobility.   Follow Up Recommendations  SNF;Supervision/Assistance - 24 hour     Equipment Recommendations  3in1 (PT) (Pt may need RW - Cannot locate the one brought to hospital)    Recommendations for Other Services       Precautions / Restrictions Precautions Precautions: Cervical;Fall Precaution Comments: History of falls Required Braces or Orthoses: Cervical Brace;Other Brace/Splint ("[place]" orthosis) Cervical Brace: Soft collar Other Brace/Splint: "[place]" orthosis for ambulation on RLE Restrictions Weight Bearing Restrictions: No    Mobility  Bed Mobility                  Transfers Overall transfer level: Needs assistance Equipment used: Rolling walker (2 wheeled) Transfers: Sit to/from Stand Sit to Stand: Min assist         General transfer comment: Min assist for RW control. Verbal cues for hand placement. Requires extra time. Performed from recliner and BSC. Pt used handrails in bathroom to assist with stand.  Ambulation/Gait Ambulation/Gait assistance: Min guard Ambulation Distance (Feet): 65 Feet Assistive device: Rolling walker (2  wheeled) Gait Pattern/deviations: Step-through pattern;Decreased stride length;Decreased dorsiflexion - right;Trunk flexed;Narrow base of support Gait velocity: decreased   General Gait Details: [place] orthosis and shoes on for ambulation. Required min guard for safety. Several cues needed for initiation as pt would freeze and state he "had a brain fart." Cues for upright posture and wider BOS.  Needed additonal cues for RW placement towards end of distance as pt stated he was becoming tired and wanted to sit.   Stairs            Wheelchair Mobility    Modified Rankin (Stroke Patients Only)       Balance                                    Cognition Arousal/Alertness: Awake/alert Behavior During Therapy: Flat affect Overall Cognitive Status: Impaired/Different from baseline Area of Impairment: Attention;Awareness   Current Attention Level: Selective Memory: Decreased short-term memory     Awareness: Anticipatory   General Comments: Pt seemed to grow increasingly confused as therapy progressed today. Was found trying to ambulate to restroom but stated he got stuck and forgot what he was doing. He was becoming flat with his affect compared to the interactions we have had previously. Asked several times if his phose was ringing. Admitted he was feeling somewhat "off" towards end of therapy. Was asking questions out of context of the conversation we were having.    Exercises      General Comments General comments (skin integrity, edema, etc.): Significant LE swelling R>L. Pt with altered mental status/ different from previous sessions (see  cognition section.)  Pt urinated on floor while on BSC above toilet in bathroom. He reports he did not realize he was doing so. Several episodes of freezing during ambulation with increasingly flat affect. SpO2 checked at 90% on room air. increased to 94% on 1 L supplemental oxygen. Reviewed safety with ambulation and how to  call for nursing when he needs to use restroom.      Pertinent Vitals/Pain 6/10 pain Nurse notified Patient repositioned in chair for comfort.     Home Living                      Prior Function            PT Goals (current goals can now be found in the care plan section) Acute Rehab PT Goals Patient Stated Goal: Get better so he can walk and fish again PT Goal Formulation: With patient Time For Goal Achievement: 10/04/13 Potential to Achieve Goals: Good Progress towards PT goals: Progressing toward goals    Frequency  Min 5X/week    PT Plan Current plan remains appropriate    Co-evaluation             End of Session Equipment Utilized During Treatment: Gait belt;Cervical collar;Other (comment) ("[place]" orthosis) Activity Tolerance: Patient tolerated treatment well Patient left: in chair;with call bell/phone within reach     Time: 1145-1240 (Pt spent extended time on commode,  25 minutes therapeutic) PT Time Calculation (min): 55 min  Charges:  $Gait Training: 8-22 mins $Self Care/Home Management: 8-22                    G Codes:      Elayne Snare, [place] 09/29/2013, 1:22 PM

## 2013-09-29 NOTE — Consult Note (Addendum)
Patient Demographics  Travis [name], is a 63 y.o. male   MRN: [id number]   DOB - 11-02-1950  Admit Date - 09/26/2013    Outpatient Primary MD for the patient is [name], MD  Consult requested in the Hospital by Jessy Oto, MD, On 09/29/2013    Reason for consult fever   With History of -  Past Medical History  Diagnosis Date  . Impaired glucose tolerance 09/27/2010  . HYPERLIPIDEMIA-MIXED 07/15/2009  . Chronic pain syndrome 10/27/2009  . HYPERTENSION 06/24/2009  . CORONARY ARTERY DISEASE 06/24/2009    a. s/p multiple PCIs - In 2008 he had a Taxus DES to the mild LAD, Endeavor DES to mid LCX and distal LCX. In January 2009 he had DES to distal LCX, mid LCX and proximal LCX. In November 2009 had BMS x 2 to the mid RCA. Cath 10/2011 with patent stents, noncardiac CP. LHC 01/2013: patent stents (noncardiac CP).  [name] URETHRAL STRICTURE 06/24/2009  . DEGENERATIVE JOINT DISEASE 06/24/2009  . SHOULDER PAIN, BILATERAL 06/24/2009  . ANKLE PAIN, RIGHT 06/24/2009  . [place] DISEASE, LUMBAR 04/19/2010  . SCIATICA, LEFT 04/19/2010  . NEPHROLITHIASIS, HX OF 06/24/2009  . ERECTILE DYSFUNCTION, ORGANIC 05/30/2010  . Obesity   . Allergy to clopidogrel   . ALLERGIC RHINITIS 06/24/2009  . Anxiety   . Depression     Prior suicide attempt  . OSA (obstructive sleep apnea)     not using CPAP  . Irregular heart beat   . Myocardial infarction   . Pneumonia   . Self-catheterizes urinary bladder   . History of kidney stones   . Diabetes mellitus without complication       Past Surgical History  Procedure Laterality Date  . Tonsillectomy    . Rotator cuff repair      bilateral; X3 on right X 2 of left  . S/p knee replacement  2008  . S/p bilat wrist surgury    . S/p urethral dilations,  multi 2009,2010  . Carpal tunnel release Bilateral   . S/p right  knee arthroscopy  03/2005    and multi prior  . S/p umbilical hernia    . S/p left knee arthroscopy  june 2002    multi prior  . S/p right shoulder rotater cuff surgury  april 2011    Dr. [name] Lighter  . Coronary angioplasty with stent placement    . Joint replacement Bilateral   . Hernia repair      umbilical    in for      HPI  Travis [name]  is a 63 y.o. male, with history of CAD status post multiple stents follows with [place] cardiology, hypertension, dyslipidemia, chronic pain on narcotics, obesity, obstructive sleep apnea not using CPAP, urethral stricture who was admitted by neurosurgery on Friday and underwent Anterior cervical discectomy with fusion C3-4, plate and screw fixation allograft bone graft 3 days ago, he was due to be discharged to nursing home however this morning he had a temp of  103, does have a cough which is productive, denies any chest pain or shortness of breath, chest x-ray 2 view was unremarkable, denies any headache, some C-spine pain postop, no abdominal pain or diarrhea, no new joint pains or aches skin rashes or bruises. He has chronic swelling in both lower extremities no change in that pattern. Per neurosurgery C-spine wound looks clean.     Review of Systems    In addition to the HPI above,   No Fever-chills, except this morning No Headache, No changes with Vision or hearing, No problems swallowing food or Liquids, No Chest pain, Cough or Shortness of Breath, No Abdominal pain, No Nausea or Vommitting, Bowel movements are regular, No Blood in stool or Urine, No dysuria, No new skin rashes or bruises, No new joints pains-aches, except C-spine pain postop No new weakness, tingling, numbness in any extremity, No recent weight gain or loss, No polyuria, polydypsia or polyphagia, No significant Mental Stressors.  A full 10 point Review of Systems was done, except as stated above, all other Review of Systems were negative.   Social  History History  Substance Use Topics  . Smoking status: Former Smoker    Types: Cigars    Quit date: 08/28/2010  . Smokeless tobacco: Never Used     Comment: rare  . Alcohol Use: 0.0 oz/week     Comment: rare (4 beers all summer)      Family History Family History  Problem Relation Age of Onset  . Depression Mother   . Heart disease Mother   . Hypertension Mother   . Cancer Mother     Breast  . Diabetes Father   . Heart disease Father     CABG  . Cancer Father     prostate and skin cancer  . Hypertension Father   . Hyperlipidemia Father   . Depression Brother     x 2  . Hypertension Brother     x2  . Coronary artery disease Other   . Hypertension Other   . Depression Other   . Heart disease Maternal Grandfather   . Early death Maternal Grandfather 26    heart attack  . Early death Paternal Grandfather       Prior to Admission medications   Medication Sig Start Date End Date Taking? Authorizing Provider  amLODipine (NORVASC) 5 MG tablet Take 5 mg by mouth daily.   Yes Historical Provider, MD  ARIPiprazole (ABILIFY) 5 MG tablet Take 5 mg by mouth daily. 08/13/13  Yes Kathlee Nations, MD  aspirin 81 MG tablet Take 1 tablet (81 mg total) by mouth daily. 06/28/12  Yes Waylan Boga, NP  atorvastatin (LIPITOR) 20 MG tablet Take 20 mg by mouth daily.   Yes Historical Provider, MD  benazepril (LOTENSIN) 20 MG tablet Take 20 mg by mouth daily.   Yes Historical Provider, MD  clonazePAM (KLONOPIN) 2 MG tablet Take 2 mg by mouth 2 (two) times daily as needed for anxiety (anxiety).   Yes Historical Provider, MD  cyclobenzaprine (FLEXERIL) 5 MG tablet Take 5 mg by mouth 3 (three) times daily as needed for muscle spasms.   Yes Historical Provider, MD  divalproex (DEPAKOTE) 250 MG DR tablet Take 250-500 mg by mouth 2 (two) times daily. 250 mg in morning and 500 mg at bedtime   Yes Historical Provider, MD  FLUoxetine (PROZAC) 40 MG capsule Take 40 mg by mouth daily. 08/13/13  Yes Kathlee Nations, MD  fluticasone (FLONASE) 50 MCG/ACT  nasal spray Place 1 spray into both nostrils daily.   Yes Historical Provider, MD  gabapentin (NEURONTIN) 400 MG capsule Take 800 mg by mouth 4 (four) times daily. 06/28/12  Yes Waylan Boga, NP  indomethacin (INDOCIN) 25 MG capsule Take 50 mg by mouth 2 (two) times daily with a meal.  03/12/13  Yes Historical Provider, MD  isosorbide mononitrate (IMDUR) 60 MG 24 hr tablet Take 90 mg by mouth daily.   Yes Historical Provider, MD  metFORMIN (GLUCOPHAGE) 500 MG tablet Take by mouth 2 (two) times daily with a meal.   Yes Historical Provider, MD  oxycodone (ROXICODONE) 30 MG immediate release tablet Take 30 mg by mouth every 4 (four) hours.   Yes Historical Provider, MD  prasugrel (EFFIENT) 10 MG TABS tablet Take 10 mg by mouth daily.   Yes Historical Provider, MD  traZODone (DESYREL) 100 MG tablet Take 100 mg by mouth at bedtime. 08/13/13  Yes Kathlee Nations, MD  diclofenac sodium (VOLTAREN) 1 % GEL Apply 2 g topically 4 (four) times daily as needed. For pain    Historical Provider, MD  lidocaine (LIDODERM) 5 % Place 1 patch onto the skin daily as needed. Remove & Discard patch within 12 hours or as directed by MD. For  Pain.    Historical Provider, MD  sildenafil (VIAGRA) 50 MG tablet Take 1 tablet (50 mg total) by mouth daily as needed for erectile dysfunction. 09/03/13   Theodis Blaze, MD    Anti-infectives   Start     Dose/Rate Route Frequency Ordered Stop   09/26/13 1700  ceFAZolin (ANCEF) IVPB 1 g/50 mL premix     1 g 100 mL/hr over 30 Minutes Intravenous Every 8 hours 09/26/13 1652 09/27/13 0229   09/26/13 0600  ceFAZolin (ANCEF) 3 g in dextrose 5 % 50 mL IVPB     3 g 160 mL/hr over 30 Minutes Intravenous On call to O.R. 09/25/13 1355 09/26/13 1340      Scheduled Meds: . amLODipine  5 mg Oral Daily  . ARIPiprazole  5 mg Oral Daily  . aspirin  81 mg Oral Daily  . atorvastatin  20 mg Oral Daily  . benazepril  20 mg Oral Daily  . divalproex  250  mg Oral q morning - 10a   And  . divalproex  500 mg Oral QHS  . docusate sodium  100 mg Oral BID  . FLUoxetine  40 mg Oral Daily  . fluticasone  1 spray Each Nare Daily  . gabapentin  800 mg Oral QID  . insulin aspart  0-20 Units Subcutaneous 6 times per day  . isosorbide mononitrate  90 mg Oral Daily  . polyethylene glycol  17 g Oral Daily  . prasugrel  10 mg Oral Daily  . traZODone  100 mg Oral QHS   Continuous Infusions: . lactated ringers 50 mL/hr at 09/26/13 1051   PRN Meds:.acetaminophen, acetaminophen, alum & mag hydroxide-simeth, bisacodyl, clonazePAM, cyclobenzaprine, HYDROcodone-acetaminophen, menthol-cetylpyridinium, methocarbamol (ROBAXIN) IV, methocarbamol, morphine injection, ondansetron (ZOFRAN) IV, oxycodone, oxyCODONE-acetaminophen, phenol, polyethylene glycol  No Known Allergies  Physical Exam  Vitals  Blood pressure 130/64, pulse 81, temperature 99.9 F (37.7 C), temperature source Oral, resp. rate 16, height 6' 1"  (1.854 m), weight 127.007 kg (280 lb), SpO2 93.00%.   1. General middle-aged white male sitting in hospital chair, he appears to be somewhat somnolent,  wearing c-collar  2. Normal affect and insight, Not Suicidal or Homicidal, Awake Alert, Oriented X 2.  3.  No F.N deficits, ALL C.Nerves Intact, Strength 5/5 all 4 extremities, Sensation intact all 4 extremities, Plantars down going.  4. Ears and Eyes appear Normal, Conjunctivae clear, PERRLA. Moist Oral Mucosa.  5. Supple Neck, No JVD, No cervical lymphadenopathy appriciated, No Carotid Bruits.  6. Symmetrical Chest wall movement, Good air movement bilaterally, CTAB.  7. RRR, No Gallops, Rubs or Murmurs, No Parasternal Heave.  8. Positive Bowel Sounds, Abdomen Soft, Non tender, No organomegaly appriciated,No rebound -guarding or rigidity.  9.  No Cyanosis, Normal Skin Turgor, No Skin Rash or Bruise. Chronically swollen bilateral lower extremities per patient  10. Good muscle tone,  joints  appear normal , no effusions, Normal ROM.  11. No Palpable Lymph Nodes in Neck or Axillae     Data Review  CBC  Recent Labs Lab 09/24/13 1155  WBC 3.9*  HGB 12.8*  HCT 37.9*  PLT 157  MCV 92.0  MCH 31.1  MCHC 33.8  RDW 13.8   ------------------------------------------------------------------------------------------------------------------  Chemistries   Recent Labs Lab 09/24/13 1155 09/27/13 0505  NA 140 141  K 4.5 4.0  CL 102 103  CO2 22 27  GLUCOSE 111* 95  BUN 20 12  CREATININE 1.03 0.86  CALCIUM 8.8 8.5   ------------------------------------------------------------------------------------------------------------------ estimated creatinine clearance is 125.9 ml/min (by C-G formula based on Cr of 0.86). ------------------------------------------------------------------------------------------------------------------ No results found for this basename: TSH, T4TOTAL, FREET3, T3FREE, THYROIDAB,  in the last 72 hours   Coagulation profile No results found for this basename: INR, PROTIME,  in the last 168 hours ------------------------------------------------------------------------------------------------------------------- No results found for this basename: DDIMER,  in the last 72 hours -------------------------------------------------------------------------------------------------------------------  Cardiac Enzymes No results found for this basename: CK, CKMB, TROPONINI, MYOGLOBIN,  in the last 168 hours ------------------------------------------------------------------------------------------------------------------ No components found with this basename: POCBNP,    ---------------------------------------------------------------------------------------------------------------  Urinalysis    Component Value Date/Time   COLORURINE YELLOW 09/28/2013 1700   APPEARANCEUR CLEAR 09/28/2013 1700   LABSPEC 1.020 09/28/2013 1700   PHURINE 6.0 09/28/2013 1700    GLUCOSEU NEGATIVE 09/28/2013 [place] 03/13/2013 1244   HGBUR TRACE* 09/28/2013 1700   HGBUR negative 04/19/2010 1014   BILIRUBINUR NEGATIVE 09/28/2013 1700   KETONESUR NEGATIVE 09/28/2013 1700   PROTEINUR NEGATIVE 09/28/2013 1700   UROBILINOGEN 1.0 09/28/2013 1700   NITRITE NEGATIVE 09/28/2013 [place] 09/28/2013 1700     Imaging results:   Dg Chest 2 View  09/28/2013   CLINICAL DATA:  Fever.  Productive cough.  EXAM: CHEST  2 VIEW  COMPARISON:  09/02/2013  FINDINGS: The heart size and mediastinal contours are within normal limits. Both lungs are clear. The visualized skeletal structures are unremarkable.  IMPRESSION: No active cardiopulmonary disease.   Electronically Signed   By: Earle Gell M.D.   On: 09/28/2013 15:09   Dg Chest 2 View  09/02/2013   CLINICAL DATA:  Chest pain  EXAM: CHEST  2 VIEW  COMPARISON:  05/03/2013  FINDINGS: Cardiac shadow is stable. The lungs are well aerated bilaterally. Changes of prior coronary stenting are noted. No acute infiltrate or sizable effusion is seen.  IMPRESSION: No acute abnormality noted.   Electronically Signed   By: Inez Catalina M.D.   On: 09/02/2013 15:54   Dg Cervical Spine 2-3 Views  09/26/2013   CLINICAL DATA:  63 year old male undergoing spine surgery. Initial encounter.  EXAM: CERVICAL SPINE - 2-3 VIEW  COMPARISON:  Cervical spine MRI 09/12/2013.  FINDINGS: Intraoperative portable cross-table lateral views of the cervical  spine.  Film labeled #1 at 1347 hrs. Needle directed at the C3-C4 disc space.  Film labeled found to at 1458 hrs.  C3-C4 ACDF hardware in place.  IMPRESSION: C3-C4 ACDF depicted.   Electronically Signed   By: Lars Pinks M.D.   On: 09/26/2013 15:25   Ct Head Wo Contrast  09/02/2013   CLINICAL DATA:  Head and neck pain.  Unable to walk.  EXAM: CT HEAD WITHOUT CONTRAST  CT CERVICAL SPINE WITHOUT CONTRAST  TECHNIQUE: Multidetector CT imaging of the head and cervical spine was performed following the  standard protocol without intravenous contrast. Multiplanar CT image reconstructions of the cervical spine were also generated.  COMPARISON:  MRI 03/21/2013.  MRI 12/03/2009.  FINDINGS: CT HEAD FINDINGS  The brain has a normal appearance without evidence of atrophy, old or acute infarction, mass lesion, hemorrhage, hydrocephalus or extra-axial collection. No calvarial abnormality. No significant sinus disease.  CT CERVICAL SPINE FINDINGS  Alignment is normal.  No evidence of fracture.  There is ordinary osteoarthritis of the C1-2 articulation.  C2-3: Facet degeneration on the left. Mild foraminal narrowing on the left.  C3-4: Facet arthropathy right worse than left. Spondylosis with shallow protrusion of disc material and uncovertebral hypertrophy. Foraminal stenosis bilaterally left worse than right.  C4-5: Facet degeneration on the left. No canal or foraminal stenosis.  C5-6: Facet degeneration on the left. 1 mm of anterolisthesis. Mild foraminal narrowing on the left.  C6-7: Spondylosis with endplate osteophytes. Mild narrowing of the canal, not pronounced.  C7-T1: Bilateral facet arthropathy with 2 mm of anterolisthesis. No canal stenosis. Foraminal narrowing left more than right.  IMPRESSION: Head CT:  Negative  Cervical spine CT: Chronic degenerative facet arthropathy and degenerative spondylosis. By CT, there does not appear to be any compromise of the spinal canal. There is foraminal narrowing at multiple levels that could result in cervical radicular symptoms.   Electronically Signed   By: Nelson Chimes M.D.   On: 09/02/2013 15:51   Ct Soft Tissue Neck W Contrast  09/02/2013   CLINICAL DATA:  Fever  EXAM: CT NECK WITH CONTRAST  TECHNIQUE: Multidetector CT imaging of the neck was performed using the standard protocol following the bolus administration of intravenous contrast.  CONTRAST:  187m OMNIPAQUE IOHEXOL 300 MG/ML  SOLN  COMPARISON:  CT HEAD W/O CM dated 09/02/2013  FINDINGS: No retropharyngeal  abscess. Multilevel cervical spondylosis and degenerative disc disease. Chronic right maxillary sinusitis.  Mild prominence of the adenoidal tissue and mildly prominent palatine tonsils. Lingual tonsils are prominent, right greater than left.  No pathologic adenopathy in the neck. Salivary glands appear symmetric.  Dominant right vertebral artery. Main vascular structures in the neck are patent.  Thyroid gland unremarkable.  Glottic structures appear symmetric.  IMPRESSION: 1. Slight adenoidal, lingual tonsillar, and palatine tonsillar prominence, likely reactive. The airway does not appear particularly threatened. No pathologic adenopathy. 2. The lingual tonsillar hypertrophy is somewhat asymmetric, with the right larger than the left. It may be prudent to follow-up with direct visualization of the lingual tonsils in order to ensure resolution of presumably reactive adenopathy and exclude the unlikely possibility of a lingual tonsillar mass. 3. Cervical spondylosis and degenerative disc disease, with osseous foraminal narrowing most striking on the left at C3-4. 4. Chronic right maxillary sinusitis.   Electronically Signed   By: WSherryl BartersM.D.   On: 09/02/2013 21:51   Ct Cervical Spine Wo Contrast  09/02/2013   CLINICAL DATA:  Head and neck pain.  Unable to walk.  EXAM: CT HEAD WITHOUT CONTRAST  CT CERVICAL SPINE WITHOUT CONTRAST  TECHNIQUE: Multidetector CT imaging of the head and cervical spine was performed following the standard protocol without intravenous contrast. Multiplanar CT image reconstructions of the cervical spine were also generated.  COMPARISON:  MRI 03/21/2013.  MRI 12/03/2009.  FINDINGS: CT HEAD FINDINGS  The brain has a normal appearance without evidence of atrophy, old or acute infarction, mass lesion, hemorrhage, hydrocephalus or extra-axial collection. No calvarial abnormality. No significant sinus disease.  CT CERVICAL SPINE FINDINGS  Alignment is normal.  No evidence of fracture.   There is ordinary osteoarthritis of the C1-2 articulation.  C2-3: Facet degeneration on the left. Mild foraminal narrowing on the left.  C3-4: Facet arthropathy right worse than left. Spondylosis with shallow protrusion of disc material and uncovertebral hypertrophy. Foraminal stenosis bilaterally left worse than right.  C4-5: Facet degeneration on the left. No canal or foraminal stenosis.  C5-6: Facet degeneration on the left. 1 mm of anterolisthesis. Mild foraminal narrowing on the left.  C6-7: Spondylosis with endplate osteophytes. Mild narrowing of the canal, not pronounced.  C7-T1: Bilateral facet arthropathy with 2 mm of anterolisthesis. No canal stenosis. Foraminal narrowing left more than right.  IMPRESSION: Head CT:  Negative  Cervical spine CT: Chronic degenerative facet arthropathy and degenerative spondylosis. By CT, there does not appear to be any compromise of the spinal canal. There is foraminal narrowing at multiple levels that could result in cervical radicular symptoms.   Electronically Signed   By: Nelson Chimes M.D.   On: 09/02/2013 15:51   Mr Cervical Spine Wo Contrast  09/03/2013   CLINICAL DATA:  Acute neck pain. History degenerative joint disease. Difficulty walking and standing.  EXAM: MRI CERVICAL SPINE WITHOUT CONTRAST  TECHNIQUE: Multiplanar, multisequence MR imaging was performed. No intravenous contrast was administered.  COMPARISON:  MRI of the cervical spine 12/03/2009.  FINDINGS: Normal signal is present in the cervical and upper thoracic spinal cord the lowest imaged level. Craniocervical junction is within normal limits. The sphenoid sinuses are opacified. The visualized intracranial contents are normal.  C2-3: Mild left foraminal narrowing is due to uncovertebral and facet spurring. The central canal is patent.  C3-4: A broad-based disc osteophyte complex has progressed. There is contact and distortion of the left ventral surface of the cord. Moderate foraminal stenosis is  present bilaterally.  C4-5: A mild broad-based disc osteophyte complex is present. Uncovertebral and facet spurring has progressed with mild to moderate foraminal narrowing bilaterally.  C5-6: Slight disc bulging is present without significant stenosis or change.  C6-7: A progressive broad-based disc osteophyte complex is asymmetric to the right. Mild central and right foraminal narrowing is present.  C7-T1: Asymmetric right-sided facet hypertrophy is present. There is no significant stenosis.  IMPRESSION: 1. Progressive broad-based disc osteophyte, plexus and left paramedian protrusion at C3-4 results and moderate central canal stenosis with contact and distortion of the left ventral surface of the cord but no definite abnormal signal. 2. Moderate foraminal stenosis bilaterally at C3-4. 3. Progressive uncovertebral and facet spurring at C4-5 with mild to moderate foraminal stenosis bilaterally. 4. Progressive broad-based disc osteophyte complex with no mild central and right foraminal stenosis at C6-7. 5. Progressive asymmetric right-sided facet hypertrophy at C7-T1 without significant stenosis.   Electronically Signed   By: Lawrence Santiago M.D.   On: 09/03/2013 18:09   Mr Lumbar Spine Wo Contrast  09/03/2013   CLINICAL DATA:  Low back pain and bilateral leg weakness.  EXAM: MRI LUMBAR SPINE WITHOUT CONTRAST  TECHNIQUE: Multiplanar, multisequence MR imaging was performed. No intravenous contrast was administered.  COMPARISON:  MRI lumbar spine 07/24/2013 at [facility].  FINDINGS: Normal signal is present in the conus medullaris which terminates at L1-2, within normal limits. Marrow signal is normal. Schmorl's nodes are present along the superior endplate of L3. Dextro convex scoliosis is centered at L3.  L1-2: A a right lateral disc protrusion and asymmetric right-sided facet hypertrophy is stable.  L2-3: A broad-based disc herniation is asymmetric to the left. Facet hypertrophy is  asymmetric on the left. Mild left lateral recess narrowing is stable.  L3-4: A broad-based disc herniation is present. Moderate facet hypertrophy is noted bilaterally. Mild lateral recess narrowing bilaterally is stable, worse on the left. Mild left foraminal narrowing is stable as well.  L4-5: This is again the most severe level. A rightward disc herniation is present. Advanced facet hypertrophy is present bilaterally. Moderate central canal stenosis is evident with right greater left lateral recess narrowing. Moderate foraminal stenosis is worse on the left.  L5-S1: There is chronic loss of disc height. Mild facet hypertrophy is present. There is no significant stenosis.  IMPRESSION: 1. No significant interval change. 2. Most severe level is again noted to the L4-5 with moderate central canal stenosis and right greater than left lateral recess narrowing. 3. Moderate foraminal stenosis is worse on the left L4-5.   Electronically Signed   By: Lawrence Santiago M.D.   On: 09/03/2013 18:35   Dg Lumbar Puncture Fluoro Guide  09/03/2013   CLINICAL DATA:  Fever, stiff neck, possible meningitis  EXAM: LUMBAR PUNCTURE UNDER FLUOROSCOPY  FLUOROSCOPY TIME:  9 seconds  TECHNIQUE: The procedure, risks (including but not limited to bleeding, infection, organ damage ), benefits, and alternatives were explained to the patient. Questions regarding the procedure were encouraged and answered. The patient understands and consents to the procedure. An appropriate skin entry site was determined fluoroscopically. Operator donned sterile gloves and mask. Skin site was marked, then prepped with Betadine, draped in usual sterile fashion, and infiltrated locally with 1% lidocaine. A 20 gauge spinal needle advanced into the thecal sac at L3 from a right interlaminar approach. Clear colorless CSF spontaneously returned, with a corrected opening pressure of 16 cm water. 54m CSF were collected and divided among 4 sterile vials for the requested  laboratory studies. The needle was then removed. No immediate complication.  IMPRESSION: 1. Technically successful lumbar puncture under fluoroscopy.   Electronically Signed   By: DArne ClevelandM.D.   On: 09/03/2013 08:47        Assessment & Plan   1. Post procedure fever. Does have a productive cough, however 2 view chest x-ray is unremarkable, UA unremarkable. This could be acute bronchitis, atelectasis or early HCAP, will get 2 sets of blood cultures, sputum Gram stain culture, order incentive spirometry every hour, for now since at least he has acute bronchitis we'll place him on IV doxycycline covering both for atypical bugs and potentially MRSA. Will obtain stat CBC, will monitor him closely, supportive care with oxygen and nebulizer treatments as needed.   Will monitor for headaches, any signs of postop C-spine infection. Will defer this to neurosurgery. Have discussed with neurosurgery personally Dr. NLouanne Skye.     2. CAD status post multiple stents. No acute issues he is chest pain-free, he is currently on ASA - Effient, Imdur and Statin for secondary prevention which will be continued.     3.  Type 2 diabetes mellitus. Glycemic control appears stable on sliding scale which should be continued.     4. Dyslipidemia. Continue home dose statin.     5. Hypertension. Blood pressure stable on home dose Norvasc.     6. Recent C-spine surgery. Will defer postop management of this problem 2 primary team which is neurosurgery. Minimize narcotics, sedatives medications as muscle relaxants etc.     7. History of urethral stricture. Will check bladder scan to monitor post void residual.     8.Lower extremity swelling which according the patient is chronic. Check one-time venous duplex, rule out DVT as could be a potential cause of fever.      DVT Prophylaxis per primary team which is neurosurgery  AM Labs Ordered, also please review Full Orders  Family Communication:  Plan discussed with patient     Thank you for the consult, we will follow the patient with you in the Hospital.   Thurnell Lose M.D on 09/29/2013 at 1:35 PM  Between 7am to 7pm - Pager - [phone]  After 7pm go to [email or web address] - password [email or web address]  And look for the night coverage person covering me after hours   Thank you for the consult, we will follow the patient with you in the [facility]  [phone]

## 2013-09-30 ENCOUNTER — Encounter (HOSPITAL_COMMUNITY): Payer: Self-pay | Admitting: General Practice

## 2013-09-30 DIAGNOSIS — J209 Acute bronchitis, unspecified: Secondary | ICD-10-CM | POA: Diagnosis not present

## 2013-09-30 DIAGNOSIS — R338 Other retention of urine: Secondary | ICD-10-CM | POA: Diagnosis not present

## 2013-09-30 LAB — GLUCOSE, CAPILLARY
GLUCOSE-CAPILLARY: 109 mg/dL — AB (ref 70–99)
GLUCOSE-CAPILLARY: 111 mg/dL — AB (ref 70–99)
GLUCOSE-CAPILLARY: 147 mg/dL — AB (ref 70–99)
Glucose-Capillary: 113 mg/dL — ABNORMAL HIGH (ref 70–99)
Glucose-Capillary: 124 mg/dL — ABNORMAL HIGH (ref 70–99)
Glucose-Capillary: 116 mg/dL — ABNORMAL HIGH (ref 70–99)

## 2013-09-30 MED ORDER — TAMSULOSIN HCL 0.4 MG PO CAPS
0.4000 mg | ORAL_CAPSULE | Freq: Every day | ORAL | Status: DC
  Administered 2013-09-30 – 2013-10-01 (×2): 0.4 mg via ORAL
  Filled 2013-09-30 (×3): qty 1

## 2013-09-30 MED ORDER — METFORMIN HCL 500 MG PO TABS
500.0000 mg | ORAL_TABLET | Freq: Two times a day (BID) | ORAL | Status: DC
  Administered 2013-09-30 – 2013-10-01 (×2): 500 mg via ORAL
  Filled 2013-09-30 (×4): qty 1

## 2013-09-30 MED ORDER — IPRATROPIUM BROMIDE 0.02 % IN SOLN
0.5000 mg | Freq: Once | RESPIRATORY_TRACT | Status: AC
  Administered 2013-09-30: 0.5 mg via RESPIRATORY_TRACT
  Filled 2013-09-30: qty 2.5

## 2013-09-30 MED ORDER — DOXYCYCLINE HYCLATE 100 MG PO TABS
100.0000 mg | ORAL_TABLET | Freq: Two times a day (BID) | ORAL | Status: DC
  Administered 2013-09-30 – 2013-10-01 (×2): 100 mg via ORAL
  Filled 2013-09-30 (×3): qty 1

## 2013-09-30 MED ORDER — ALBUTEROL SULFATE (2.5 MG/3ML) 0.083% IN NEBU
5.0000 mg | INHALATION_SOLUTION | Freq: Once | RESPIRATORY_TRACT | Status: AC
  Administered 2013-09-30: 5 mg via RESPIRATORY_TRACT
  Filled 2013-09-30: qty 6

## 2013-09-30 MED ORDER — INSULIN ASPART 100 UNIT/ML ~~LOC~~ SOLN
0.0000 [IU] | Freq: Three times a day (TID) | SUBCUTANEOUS | Status: DC
  Administered 2013-09-30 – 2013-10-01 (×2): 3 [IU] via SUBCUTANEOUS

## 2013-09-30 MED ORDER — FUROSEMIDE 10 MG/ML IJ SOLN
20.0000 mg | Freq: Once | INTRAMUSCULAR | Status: AC
  Administered 2013-09-30: 20 mg via INTRAVENOUS
  Filled 2013-09-30: qty 2

## 2013-09-30 NOTE — Care Management Note (Signed)
CARE MANAGEMENT NOTE 09/30/2013  Patient:  Travis [name], Travis [name]   Account Number:  [id number]  Date Initiated:  09/29/2013  Documentation initiated by:  [facility]  Subjective/Objective Assessment:   admitted s/p ACDF C3-4     Action/Plan:   PT/OT evals-recommended SNF   Anticipated DC Date:  09/30/2013   Anticipated DC Plan:  SKILLED NURSING FACILITY  In-house referral  Clinical Social Worker      DC Planning Services  CM consult      Choice offered to / List presented to:             Status of service:  Completed, signed off Medicare Important Message given?  YES (If response is "NO", the following Medicare IM given date fields will be blank) Date Medicare IM given:  09/28/2013 Date Additional Medicare IM given:  09/30/2013  Discharge Disposition:  [facility]

## 2013-09-30 NOTE — Progress Notes (Signed)
Consult Note                                            Patient Demographics  Travis [name], is a 63 y.o. male, DOB - 31-Aug-1950, MHD:[id number]  Admit date - 09/26/2013   Admitting Physician Jessy Oto, MD  Outpatient Primary MD for the patient is [name], MD  LOS - 4   No chief complaint on file.       Assessment & Plan    1. Post procedure fever. Does have a productive cough, however 2 view chest x-ray is unremarkable, UA unremarkable. Acute bronchitis +  Urinary retention with > 1lit residual   -   Improved on Doxy, can switch to PO 19m BID for 7 days tomorrow, monitort 2 sets of blood cultures - sputum Gram stain culture till tomorrow, ordered incentive spirometry every hour, continue supportive care with oxygen and nebulizer treatments as needed.    Will monitor for headaches, any signs of postop C-spine infection. Will defer this to neurosurgery. Have discussed with neurosurgery personally Dr. NLouanne Skye     2. CAD status post multiple stents. No acute issues he is chest pain-free, he is currently on ASA - Effient, Imdur and Statin for secondary prevention which will be continued.      3. Type 2 diabetes mellitus. Glycemic control appears stable on sliding scale which should be continued.   CBG (last 3)   Recent Labs  09/29/13 2027 09/30/13 0020 09/30/13 0412  GLUCAP 112* 111* 113*      4. Dyslipidemia. Continue home dose statin.     5. Hypertension. Blood pressure stable on home dose Norvasc.     6. Recent C-spine surgery. Will defer postop management of this problem 2 primary team which is neurosurgery. Minimized narcotics, sedatives medications as muscle relaxants etc.     7. History of urethral stricture. Had > 1lit post void residual, placed on Foley and Flomax, should follow with his Urologist Dr NKellie Simmeringin 1  week. UA looked stable.    8.Lower extremity swelling which according the patient is chronic. Negative venous duplex, ruled out DVT.    Medications  Scheduled Meds: . amLODipine  5 mg Oral Daily  . ARIPiprazole  5 mg Oral Daily  . aspirin  81 mg Oral Daily  . atorvastatin  20 mg Oral Daily  . benazepril  20 mg Oral Daily  . divalproex  250 mg Oral q morning - 10a   And  . divalproex  500 mg Oral QHS  . docusate sodium  100 mg Oral BID  . doxycycline (VIBRAMYCIN) IV  100 mg Intravenous Q12H  . FLUoxetine  40 mg Oral Daily  . fluticasone  1 spray Each Nare Daily  . gabapentin  400 mg Oral TID  . insulin aspart  0-20 Units Subcutaneous 6 times per day  . isosorbide mononitrate  90 mg Oral Daily  . polyethylene glycol  17 g Oral Daily  . prasugrel  10 mg Oral Daily  . tamsulosin  0.4 mg Oral QPC breakfast  . traZODone  50 mg Oral QHS   Continuous Infusions: . lactated ringers 50 mL/hr at 09/26/13 1051   PRN Meds:.acetaminophen, alum & mag hydroxide-simeth, bisacodyl, clonazePAM, cyclobenzaprine, HYDROcodone-acetaminophen, menthol-cetylpyridinium, morphine injection, ondansetron (ZOFRAN) IV, oxycodone, phenol, polyethylene glycol  DVT Prophylaxis   SCDs   Lab Results  Component  Value Date   PLT 156 09/29/2013    Antibiotics     Anti-infectives   Start     Dose/Rate Route Frequency Ordered Stop   09/29/13 1500  doxycycline (VIBRAMYCIN) 100 mg in dextrose 5 % 250 mL IVPB     100 mg 125 mL/hr over 120 Minutes Intravenous Every 12 hours 09/29/13 1342     09/26/13 1700  ceFAZolin (ANCEF) IVPB 1 g/50 mL premix     1 g 100 mL/hr over 30 Minutes Intravenous Every 8 hours 09/26/13 1652 09/27/13 0229   09/26/13 0600  ceFAZolin (ANCEF) 3 g in dextrose 5 % 50 mL IVPB     3 g 160 mL/hr over 30 Minutes Intravenous On call to O.R. 09/25/13 1355 09/26/13 1340          Subjective:   Travis [name] today has, No headache, No chest pain, No abdominal pain - No Nausea, No new  weakness tingling or numbness, mild Cough - No SOB.   Objective:   Filed Vitals:   09/29/13 0800 09/29/13 1400 09/30/13 0409 09/30/13 0422  BP:  107/57  145/40  Pulse:  80 77   Temp:  100.4 F (38 C)  99.2 F (37.3 C)  TempSrc:  Oral  Oral  Resp:  19 20 18   Height:      Weight:      SpO2: 93% 98% 98% 99%    Wt Readings from Last 3 Encounters:  09/26/13 127.007 kg (280 lb)  09/26/13 127.007 kg (280 lb)  09/24/13 130.908 kg (288 lb 9.6 oz)     Intake/Output Summary (Last 24 hours) at 09/30/13 0745 Last data filed at 09/30/13 0421  Gross per 24 hour  Intake    250 ml  Output   4100 ml  Net  -3850 ml     Physical Exam  Awake Alert, Oriented X 3, No new F.N deficits, Normal affect [place].AT,PERRAL Wearing C Collar, No cervical lymphadenopathy appriciated.  Symmetrical Chest wall movement, Good air movement bilaterally, Coarse B sounds RRR,No Gallops,Rubs or new Murmurs, No Parasternal Heave +ve B.Sounds, Abd Soft, Non tender, No organomegaly appriciated, No rebound - guarding or rigidity. No Cyanosis, Clubbing or edema, No new Rash or bruise     Data Review   Micro Results Recent Results (from the past 240 hour(s))  SURGICAL PCR SCREEN     Status: Abnormal   Collection Time    09/24/13 11:34 AM      Result Value Ref Range Status   MRSA, PCR NEGATIVE  NEGATIVE Final   Staphylococcus aureus POSITIVE (*) NEGATIVE Final   Comment:            The Xpert SA Assay (FDA     approved for NASAL specimens     in patients over 81 years of age),     is one component of     a comprehensive surveillance     program.  Test performance has     been validated by [employer] for patients greater     than or equal to 73 year old.     It is not intended     to diagnose infection nor to     guide or monitor treatment.    Radiology Reports Dg Chest 2 View  09/28/2013   CLINICAL DATA:  Fever.  Productive cough.  EXAM: CHEST  2 VIEW  COMPARISON:  09/02/2013  FINDINGS: The  heart size and mediastinal contours are within normal  limits. Both lungs are clear. The visualized skeletal structures are unremarkable.  IMPRESSION: No active cardiopulmonary disease.   Electronically Signed   By: Earle Gell M.D.   On: 09/28/2013 15:09   Dg Chest 2 View  09/02/2013   CLINICAL DATA:  Chest pain  EXAM: CHEST  2 VIEW  COMPARISON:  05/03/2013  FINDINGS: Cardiac shadow is stable. The lungs are well aerated bilaterally. Changes of prior coronary stenting are noted. No acute infiltrate or sizable effusion is seen.  IMPRESSION: No acute abnormality noted.   Electronically Signed   By: Inez Catalina M.D.   On: 09/02/2013 15:54   Dg Cervical Spine 2-3 Views  09/26/2013   CLINICAL DATA:  63 year old male undergoing spine surgery. Initial encounter.  EXAM: CERVICAL SPINE - 2-3 VIEW  COMPARISON:  Cervical spine MRI 09/12/2013.  FINDINGS: Intraoperative portable cross-table lateral views of the cervical spine.  Film labeled #1 at 1347 hrs. Needle directed at the C3-C4 disc space.  Film labeled found to at 1458 hrs.  C3-C4 ACDF hardware in place.  IMPRESSION: C3-C4 ACDF depicted.   Electronically Signed   By: Lars Pinks M.D.   On: 09/26/2013 15:25   Ct Head Wo Contrast  09/02/2013   CLINICAL DATA:  Head and neck pain.  Unable to walk.  EXAM: CT HEAD WITHOUT CONTRAST  CT CERVICAL SPINE WITHOUT CONTRAST  TECHNIQUE: Multidetector CT imaging of the head and cervical spine was performed following the standard protocol without intravenous contrast. Multiplanar CT image reconstructions of the cervical spine were also generated.  COMPARISON:  MRI 03/21/2013.  MRI 12/03/2009.  FINDINGS: CT HEAD FINDINGS  The brain has a normal appearance without evidence of atrophy, old or acute infarction, mass lesion, hemorrhage, hydrocephalus or extra-axial collection. No calvarial abnormality. No significant sinus disease.  CT CERVICAL SPINE FINDINGS  Alignment is normal.  No evidence of fracture.  There is ordinary  osteoarthritis of the C1-2 articulation.  C2-3: Facet degeneration on the left. Mild foraminal narrowing on the left.  C3-4: Facet arthropathy right worse than left. Spondylosis with shallow protrusion of disc material and uncovertebral hypertrophy. Foraminal stenosis bilaterally left worse than right.  C4-5: Facet degeneration on the left. No canal or foraminal stenosis.  C5-6: Facet degeneration on the left. 1 mm of anterolisthesis. Mild foraminal narrowing on the left.  C6-7: Spondylosis with endplate osteophytes. Mild narrowing of the canal, not pronounced.  C7-T1: Bilateral facet arthropathy with 2 mm of anterolisthesis. No canal stenosis. Foraminal narrowing left more than right.  IMPRESSION: Head CT:  Negative  Cervical spine CT: Chronic degenerative facet arthropathy and degenerative spondylosis. By CT, there does not appear to be any compromise of the spinal canal. There is foraminal narrowing at multiple levels that could result in cervical radicular symptoms.   Electronically Signed   By: Nelson Chimes M.D.   On: 09/02/2013 15:51   Ct Soft Tissue Neck W Contrast  09/02/2013   CLINICAL DATA:  Fever  EXAM: CT NECK WITH CONTRAST  TECHNIQUE: Multidetector CT imaging of the neck was performed using the standard protocol following the bolus administration of intravenous contrast.  CONTRAST:  158m OMNIPAQUE IOHEXOL 300 MG/ML  SOLN  COMPARISON:  CT HEAD W/O CM dated 09/02/2013  FINDINGS: No retropharyngeal abscess. Multilevel cervical spondylosis and degenerative disc disease. Chronic right maxillary sinusitis.  Mild prominence of the adenoidal tissue and mildly prominent palatine tonsils. Lingual tonsils are prominent, right greater than left.  No pathologic adenopathy in the neck. Salivary glands appear  symmetric.  Dominant right vertebral artery. Main vascular structures in the neck are patent.  Thyroid gland unremarkable.  Glottic structures appear symmetric.  IMPRESSION: 1. Slight adenoidal, lingual  tonsillar, and palatine tonsillar prominence, likely reactive. The airway does not appear particularly threatened. No pathologic adenopathy. 2. The lingual tonsillar hypertrophy is somewhat asymmetric, with the right larger than the left. It may be prudent to follow-up with direct visualization of the lingual tonsils in order to ensure resolution of presumably reactive adenopathy and exclude the unlikely possibility of a lingual tonsillar mass. 3. Cervical spondylosis and degenerative disc disease, with osseous foraminal narrowing most striking on the left at C3-4. 4. Chronic right maxillary sinusitis.   Electronically Signed   By: Sherryl Barters M.D.   On: 09/02/2013 21:51   Ct Cervical Spine Wo Contrast  09/02/2013   CLINICAL DATA:  Head and neck pain.  Unable to walk.  EXAM: CT HEAD WITHOUT CONTRAST  CT CERVICAL SPINE WITHOUT CONTRAST  TECHNIQUE: Multidetector CT imaging of the head and cervical spine was performed following the standard protocol without intravenous contrast. Multiplanar CT image reconstructions of the cervical spine were also generated.  COMPARISON:  MRI 03/21/2013.  MRI 12/03/2009.  FINDINGS: CT HEAD FINDINGS  The brain has a normal appearance without evidence of atrophy, old or acute infarction, mass lesion, hemorrhage, hydrocephalus or extra-axial collection. No calvarial abnormality. No significant sinus disease.  CT CERVICAL SPINE FINDINGS  Alignment is normal.  No evidence of fracture.  There is ordinary osteoarthritis of the C1-2 articulation.  C2-3: Facet degeneration on the left. Mild foraminal narrowing on the left.  C3-4: Facet arthropathy right worse than left. Spondylosis with shallow protrusion of disc material and uncovertebral hypertrophy. Foraminal stenosis bilaterally left worse than right.  C4-5: Facet degeneration on the left. No canal or foraminal stenosis.  C5-6: Facet degeneration on the left. 1 mm of anterolisthesis. Mild foraminal narrowing on the left.  C6-7:  Spondylosis with endplate osteophytes. Mild narrowing of the canal, not pronounced.  C7-T1: Bilateral facet arthropathy with 2 mm of anterolisthesis. No canal stenosis. Foraminal narrowing left more than right.  IMPRESSION: Head CT:  Negative  Cervical spine CT: Chronic degenerative facet arthropathy and degenerative spondylosis. By CT, there does not appear to be any compromise of the spinal canal. There is foraminal narrowing at multiple levels that could result in cervical radicular symptoms.   Electronically Signed   By: Nelson Chimes M.D.   On: 09/02/2013 15:51   Mr Cervical Spine Wo Contrast  09/03/2013   CLINICAL DATA:  Acute neck pain. History degenerative joint disease. Difficulty walking and standing.  EXAM: MRI CERVICAL SPINE WITHOUT CONTRAST  TECHNIQUE: Multiplanar, multisequence MR imaging was performed. No intravenous contrast was administered.  COMPARISON:  MRI of the cervical spine 12/03/2009.  FINDINGS: Normal signal is present in the cervical and upper thoracic spinal cord the lowest imaged level. Craniocervical junction is within normal limits. The sphenoid sinuses are opacified. The visualized intracranial contents are normal.  C2-3: Mild left foraminal narrowing is due to uncovertebral and facet spurring. The central canal is patent.  C3-4: A broad-based disc osteophyte complex has progressed. There is contact and distortion of the left ventral surface of the cord. Moderate foraminal stenosis is present bilaterally.  C4-5: A mild broad-based disc osteophyte complex is present. Uncovertebral and facet spurring has progressed with mild to moderate foraminal narrowing bilaterally.  C5-6: Slight disc bulging is present without significant stenosis or change.  C6-7: A progressive broad-based disc  osteophyte complex is asymmetric to the right. Mild central and right foraminal narrowing is present.  C7-T1: Asymmetric right-sided facet hypertrophy is present. There is no significant stenosis.   IMPRESSION: 1. Progressive broad-based disc osteophyte, plexus and left paramedian protrusion at C3-4 results and moderate central canal stenosis with contact and distortion of the left ventral surface of the cord but no definite abnormal signal. 2. Moderate foraminal stenosis bilaterally at C3-4. 3. Progressive uncovertebral and facet spurring at C4-5 with mild to moderate foraminal stenosis bilaterally. 4. Progressive broad-based disc osteophyte complex with no mild central and right foraminal stenosis at C6-7. 5. Progressive asymmetric right-sided facet hypertrophy at C7-T1 without significant stenosis.   Electronically Signed   By: Lawrence Santiago M.D.   On: 09/03/2013 18:09   Mr Lumbar Spine Wo Contrast  09/03/2013   CLINICAL DATA:  Low back pain and bilateral leg weakness.  EXAM: MRI LUMBAR SPINE WITHOUT CONTRAST  TECHNIQUE: Multiplanar, multisequence MR imaging was performed. No intravenous contrast was administered.  COMPARISON:  MRI lumbar spine 07/24/2013 at [facility].  FINDINGS: Normal signal is present in the conus medullaris which terminates at L1-2, within normal limits. Marrow signal is normal. Schmorl's nodes are present along the superior endplate of L3. Dextro convex scoliosis is centered at L3.  L1-2: A a right lateral disc protrusion and asymmetric right-sided facet hypertrophy is stable.  L2-3: A broad-based disc herniation is asymmetric to the left. Facet hypertrophy is asymmetric on the left. Mild left lateral recess narrowing is stable.  L3-4: A broad-based disc herniation is present. Moderate facet hypertrophy is noted bilaterally. Mild lateral recess narrowing bilaterally is stable, worse on the left. Mild left foraminal narrowing is stable as well.  L4-5: This is again the most severe level. A rightward disc herniation is present. Advanced facet hypertrophy is present bilaterally. Moderate central canal stenosis is evident with right greater left lateral recess  narrowing. Moderate foraminal stenosis is worse on the left.  L5-S1: There is chronic loss of disc height. Mild facet hypertrophy is present. There is no significant stenosis.  IMPRESSION: 1. No significant interval change. 2. Most severe level is again noted to the L4-5 with moderate central canal stenosis and right greater than left lateral recess narrowing. 3. Moderate foraminal stenosis is worse on the left L4-5.   Electronically Signed   By: Lawrence Santiago M.D.   On: 09/03/2013 18:35   Dg Lumbar Puncture Fluoro Guide  09/03/2013   CLINICAL DATA:  Fever, stiff neck, possible meningitis  EXAM: LUMBAR PUNCTURE UNDER FLUOROSCOPY  FLUOROSCOPY TIME:  9 seconds  TECHNIQUE: The procedure, risks (including but not limited to bleeding, infection, organ damage ), benefits, and alternatives were explained to the patient. Questions regarding the procedure were encouraged and answered. The patient understands and consents to the procedure. An appropriate skin entry site was determined fluoroscopically. Operator donned sterile gloves and mask. Skin site was marked, then prepped with Betadine, draped in usual sterile fashion, and infiltrated locally with 1% lidocaine. A 20 gauge spinal needle advanced into the thecal sac at L3 from a right interlaminar approach. Clear colorless CSF spontaneously returned, with a corrected opening pressure of 16 cm water. 21m CSF were collected and divided among 4 sterile vials for the requested laboratory studies. The needle was then removed. No immediate complication.  IMPRESSION: 1. Technically successful lumbar puncture under fluoroscopy.   Electronically Signed   By: DArne ClevelandM.D.   On: 09/03/2013 08:47    CBC  Recent  Labs Lab 09/24/13 1155 09/29/13 1410  WBC 3.9* 4.8  HGB 12.8* 11.3*  HCT 37.9* 33.9*  PLT 157 156  MCV 92.0 93.1  MCH 31.1 31.0  MCHC 33.8 33.3  RDW 13.8 13.4  LYMPHSABS  --  0.8  MONOABS  --  0.7  EOSABS  --  0.4  BASOSABS  --  0.0     Chemistries   Recent Labs Lab 09/24/13 1155 09/27/13 0505 09/29/13 1410  NA 140 141 136*  K 4.5 4.0 4.5  CL 102 103 97  CO2 22 27 28   GLUCOSE 111* 95 110*  BUN 20 12 13   CREATININE 1.03 0.86 0.82  CALCIUM 8.8 8.5 9.0  AST  --   --  43*  ALT  --   --  15  ALKPHOS  --   --  41  BILITOT  --   --  0.7   ------------------------------------------------------------------------------------------------------------------ estimated creatinine clearance is 132.1 ml/min (by C-G formula based on Cr of 0.82). ------------------------------------------------------------------------------------------------------------------ No results found for this basename: HGBA1C,  in the last 72 hours ------------------------------------------------------------------------------------------------------------------ No results found for this basename: CHOL, HDL, LDLCALC, TRIG, CHOLHDL, LDLDIRECT,  in the last 72 hours ------------------------------------------------------------------------------------------------------------------ No results found for this basename: TSH, T4TOTAL, FREET3, T3FREE, THYROIDAB,  in the last 72 hours ------------------------------------------------------------------------------------------------------------------ No results found for this basename: VITAMINB12, FOLATE, FERRITIN, TIBC, IRON, RETICCTPCT,  in the last 72 hours  Coagulation profile No results found for this basename: INR, PROTIME,  in the last 168 hours  No results found for this basename: DDIMER,  in the last 72 hours  Cardiac Enzymes No results found for this basename: CK, CKMB, TROPONINI, MYOGLOBIN,  in the last 168 hours ------------------------------------------------------------------------------------------------------------------ No components found with this basename: POCBNP,      Time Spent in minutes   35   Thurnell Lose M.D on 09/30/2013 at 7:45 AM  Between 7am to 7pm - Pager -  [phone]  After 7pm go to [email or web address] - password [email or web address]  And look for the night coverage person covering for me after hours  [facility] Office  [phone]

## 2013-09-30 NOTE — Progress Notes (Signed)
Physical Therapy Treatment Patient Details Name: Travis [name] MRN: [id number] DOB: 1950/10/12 Today's Date: 09/30/2013    History of Present Illness 63 y.o. male s/p ANTERIOR CERVICAL DISCECTOMY FUSION C3-4. Hx of HTN, CAD, and bilateral shoulder pain and continued pain in right hip with walking    PT Comments    Good session today with improved gait distance, exercise performance, cognitions. Pt limited by right hip pain;requested pain meds  Follow Up Recommendations  SNF;Supervision/Assistance - 24 hour     Equipment Recommendations  3in1 (PT)    Recommendations for Other Services       Precautions / Restrictions Precautions Precautions: Cervical;Fall Precaution Comments: History of falls Required Braces or Orthoses: Cervical Brace;Other Brace/Splint Cervical Brace: Soft collar Other Brace/Splint: "arizona" orthosis for ambulation on RLE Restrictions Weight Bearing Restrictions: No    Mobility  Bed Mobility Overal bed mobility: Needs Assistance Bed Mobility: Sit to Supine       Sit to supine: Min assist   General bed mobility comments: min assist to move legs onto bed  Transfers Overall transfer level: Needs assistance Equipment used: Rolling walker (2 wheeled)   Sit to Stand: Min guard         General transfer comment: difficulty using right arm to due to rotator  cuff injury  Ambulation/Gait Ambulation/Gait assistance: Min guard Ambulation Distance (Feet): 125 Feet Assistive device: Rolling walker (2 wheeled) Gait Pattern/deviations: Step-through pattern;Decreased stride length;Decreased stance time - right;Narrow base of support;Antalgic Gait velocity: decreased   General Gait Details: c/o right hip pain today with some scoliosis evident? Pt needed 3 standing rest breaks duing ambulation  Encouraged upright posture throughout gait session   Stairs            Wheelchair Mobility    Modified Rankin (Stroke Patients Only)        Balance Overall balance assessment: History of Falls Sitting-balance support: No upper extremity supported Sitting balance-Leahy Scale: Good     Standing balance support: Bilateral upper extremity supported Standing balance-Leahy Scale: Good Standing balance comment: minimal use of RW during standing                    Cognition Arousal/Alertness: Lethargic;Awake/alert Behavior During Therapy: WFL for tasks assessed/performed                   General Comments: Conversant and participative with PT today    Exercises General Exercises - Lower Extremity Gluteal Sets: AROM;Both;Seated;10 reps Long Arc Quad: AROM;Both;10 reps;Seated Hip Flexion/Marching: AROM;Both;Seated;10 reps    General Comments General comments (skin integrity, edema, etc.): Swelling in LEs continues;pt wants to continue wearing ankle brace at all times.  Cognition appears to be improved from previous session      Pertinent Vitals/Pain Pt did not rate, but c/o right hip pain during ambulation and needed rest breaks    Home Living                      Prior Function            PT Goals (current goals can now be found in the care plan section) Progress towards PT goals: Progressing toward goals    Frequency  Min 5X/week    PT Plan Current plan remains appropriate    Co-evaluation             End of Session Equipment Utilized During Treatment: Gait belt;Cervical collar;Other (comment) ('arizona" ankle brace on right LE)   Patient left:  in bed;with bed alarm set;with call bell/phone within reach     Time: 0915-0944 PT Time Calculation (min): 29 min  Charges:  $Gait Training: 8-22 mins $Therapeutic Exercise: 8-22 mins                    G Codes:      Norwood Levo 09/30/2013, 9:53 AM

## 2013-09-30 NOTE — Progress Notes (Addendum)
Patient ID: Travis [name], male   DOB: 1951/04/08, 63 y.o.   MRN: [id number] Subjective: 4 Days Post-Op Procedure(s) (LRB): ANTERIOR CERVICAL DISCECTOMY FUSION C3-4, plate and screw fixation, allograft bone graft (N/A) Alert, awake and oriented x 4. [facility] Dr. Candiss Norse saw Mr. Chesnut, urinary retention and acute bronchitis. Work up for DVT neg. May be ready tomorrow for SNF. Pain controlled with oral pain meds. CBGs are stable. Patient reports pain as mild.    Objective:   VITALS:  Temp:  [99.2 F (37.3 C)-100.4 F (38 C)] 99.2 F (37.3 C) (05/12 0422) Pulse Rate:  [77-80] 77 (05/12 0409) Resp:  [18-20] 18 (05/12 0422) BP: (107-145)/(40-57) 145/40 mmHg (05/12 0422) SpO2:  [98 %-99 %] 99 % (05/12 0422)  Neurologically intact ABD soft Neurovascular intact Sensation intact distally Dorsiflexion/Plantar flexion intact Incision: no drainage No cellulitis present   LABS  Recent Labs  09/29/13 1410  HGB 11.3*  WBC 4.8  PLT 156    Recent Labs  09/29/13 1410  NA 136*  K 4.5  CL 97  CO2 28  BUN 13  CREATININE 0.82  GLUCOSE 110*   No results found for this basename: LABPT, INR,  in the last 72 hours Venous doppler bilat LEs negative for DVT.  Assessment/Plan: 4 Days Post-Op Procedure(s) (LRB): ANTERIOR CERVICAL DISCECTOMY FUSION C3-4, plate and screw fixation, allograft bone graft (N/A) Acute bronchitis Urinary retention. SNF placement.   Advance diet Up with therapy Continue ABX therapy due to Post-op infection acute bronchitis. Plan for discharge tomorrow to SNF if available.FL-2 signed.    Jessy Oto 09/30/2013, 11:00 AM

## 2013-09-30 NOTE — Progress Notes (Signed)
Patient has accepted a bed at [facility]. Travis [name], MSW, [place]

## 2013-10-01 ENCOUNTER — Other Ambulatory Visit: Payer: Self-pay | Admitting: *Deleted

## 2013-10-01 ENCOUNTER — Ambulatory Visit: Payer: Medicare HMO | Admitting: Internal Medicine

## 2013-10-01 DIAGNOSIS — Z0289 Encounter for other administrative examinations: Secondary | ICD-10-CM

## 2013-10-01 DIAGNOSIS — J069 Acute upper respiratory infection, unspecified: Secondary | ICD-10-CM

## 2013-10-01 DIAGNOSIS — R338 Other retention of urine: Secondary | ICD-10-CM

## 2013-10-01 DIAGNOSIS — J209 Acute bronchitis, unspecified: Secondary | ICD-10-CM

## 2013-10-01 LAB — GLUCOSE, CAPILLARY
Glucose-Capillary: 123 mg/dL — ABNORMAL HIGH (ref 70–99)
Glucose-Capillary: 121 mg/dL — ABNORMAL HIGH (ref 70–99)

## 2013-10-01 MED ORDER — CLONAZEPAM 2 MG PO TABS: ORAL_TABLET | ORAL | Status: DC

## 2013-10-01 MED ORDER — OXYCODONE HCL 15 MG PO TABS: ORAL_TABLET | ORAL | Status: DC

## 2013-10-01 MED ORDER — DSS 100 MG PO CAPS: 100.0000 mg | ORAL_CAPSULE | Freq: Two times a day (BID) | ORAL | Status: DC

## 2013-10-01 MED ORDER — DOXYCYCLINE HYCLATE 100 MG PO TABS: 100.0000 mg | ORAL_TABLET | Freq: Two times a day (BID) | ORAL | Status: DC

## 2013-10-01 MED ORDER — BISACODYL 10 MG RE SUPP: 10.0000 mg | Freq: Every day | RECTAL | Status: DC | PRN

## 2013-10-01 MED ORDER — TAMSULOSIN HCL 0.4 MG PO CAPS: 0.4000 mg | ORAL_CAPSULE | Freq: Every day | ORAL | Status: DC

## 2013-10-01 MED ORDER — CYCLOBENZAPRINE HCL 5 MG PO TABS: 5.0000 mg | ORAL_TABLET | Freq: Three times a day (TID) | ORAL | Status: DC | PRN

## 2013-10-01 MED ORDER — OXYCODONE HCL 15 MG PO TABS: 15.0000 mg | ORAL_TABLET | ORAL | Status: DC | PRN

## 2013-10-01 NOTE — Plan of Care (Signed)
Problem: Consults Goal: Diagnosis - Spinal Surgery Cervical Spine Fusion

## 2013-10-01 NOTE — Progress Notes (Signed)
Patient ID: Travis [name], male   DOB: [birth year], 63 y.o.   MRN: [id number]  [facility] PROGRESS NOTE  Amun Stemm TDS:[id number] DOB: [birth year] DOA: 09/26/2013 PCP: [name], MD  Brief narrative: Consult for post op fever.   Principal Problem:   Spinal stenosis in cervical region - management per primary team  Active Problems:   Post op fever - Tmax 100.3, blood culture to date negative - repeat CXR today, last one was 5/10 unremarkable - repeat UA and urine culture  - doppler study negative for DVT    HYPERLIPIDEMIA - continue statin    HYPERTENSION - continue Imdur, Norvasc, Benazepril   CORONARY ARTERY DISEASE - clinically compensated   Type II or unspecified type diabetes mellitus without mention of complication, uncontrolled - continue Metformin and Insulin per home medical regimen    Acute urinary retention - UOP > 3000 over the past 24 hours    Acute bronchitis - possible source - repeat CXR - continue Doxycycline   Antibiotics:  Doxycycline   Code Status: Full Family Communication: Pt at bedside Disposition Plan: Home when medically stable  HPI/Subjective: No events overnight.   Objective: Filed Vitals:   09/30/13 1327 09/30/13 2044 10/01/13 0200 10/01/13 0551  BP: 101/40 106/75  125/62  Pulse: 86 78  88  Temp: 99.1 F (37.3 C) 100.3 F (37.9 C) 100.1 F (37.8 C) 100.3 F (37.9 C)  TempSrc: Oral Oral Oral Oral  Resp: 18 18  18   Height:      Weight:      SpO2: 100% 98%  98%    Intake/Output Summary (Last 24 hours) at 10/01/13 1424 Last data filed at 10/01/13 1013  Gross per 24 hour  Intake    340 ml  Output   3052 ml  Net  -2712 ml    Exam:   General:  Pt is alert, follows commands appropriately, not in acute distress  Cardiovascular: Regular rate and rhythm, S1/S2, no murmurs, no rubs, no gallops  Respiratory: Clear to auscultation bilaterally, no wheezing, no crackles, no rhonchi  Abdomen: Soft, non tender, non  distended, bowel sounds present, no guarding  Data Reviewed: Basic Metabolic Panel:  Recent Labs Lab 09/27/13 0505 09/29/13 1410  NA 141 136*  K 4.0 4.5  CL 103 97  CO2 27 28  GLUCOSE 95 110*  BUN 12 13  CREATININE 0.86 0.82  CALCIUM 8.5 9.0   Liver Function Tests:  Recent Labs Lab 09/29/13 1410  AST 43*  ALT 15  ALKPHOS 41  BILITOT 0.7  PROT 6.3  ALBUMIN 3.3*   CBC:  Recent Labs Lab 09/29/13 1410  WBC 4.8  NEUTROABS 3.1  HGB 11.3*  HCT 33.9*  MCV 93.1  PLT 156   CBG:  Recent Labs Lab 09/30/13 1151 09/30/13 1616 09/30/13 2156 10/01/13 0631 10/01/13 1122  GLUCAP 109* 124* 116* 123* 121*    Recent Results (from the past 240 hour(s))  SURGICAL PCR SCREEN     Status: Abnormal   Collection Time    09/24/13 11:34 AM      Result Value Ref Range Status   MRSA, PCR NEGATIVE  NEGATIVE Final   Staphylococcus aureus POSITIVE (*) NEGATIVE Final   Comment:            The Xpert SA Assay (FDA     approved for NASAL specimens     in patients over 39 years of age),     is one component of     a  comprehensive surveillance     program.  Test performance has     been validated by [facility] for patients greater     than or equal to 52 year old.     It is not intended     to diagnose infection nor to     guide or monitor treatment.  CULTURE, BLOOD (ROUTINE X 2)     Status: None   Collection Time    09/29/13  2:10 PM      Result Value Ref Range Status   Specimen Description BLOOD LEFT ANTECUBITAL   Final   Special Requests     Final   Value: BOTTLES DRAWN AEROBIC AND ANAEROBIC 10CC BLUE, 5CC RED   Culture  Setup Time     Final   Value: 09/29/2013 16:29     Performed at [facility]   Culture     Final   Value:        BLOOD CULTURE RECEIVED NO GROWTH TO DATE CULTURE WILL BE HELD FOR 5 DAYS BEFORE ISSUING A FINAL NEGATIVE REPORT     Performed at [facility]   Report Status PENDING   Incomplete  CULTURE, BLOOD (ROUTINE X 2)      Status: None   Collection Time    09/29/13  2:15 PM      Result Value Ref Range Status   Specimen Description BLOOD LEFT HAND   Final   Special Requests BOTTLES DRAWN AEROBIC ONLY 5CC   Final   Culture  Setup Time     Final   Value: 09/29/2013 16:28     Performed at [facility]   Culture     Final   Value:        BLOOD CULTURE RECEIVED NO GROWTH TO DATE CULTURE WILL BE HELD FOR 5 DAYS BEFORE ISSUING A FINAL NEGATIVE REPORT     Performed at [facility]   Report Status PENDING   Incomplete     Scheduled Meds: . amLODipine  5 mg Oral Daily  . ARIPiprazole  5 mg Oral Daily  . aspirin  81 mg Oral Daily  . atorvastatin  20 mg Oral Daily  . benazepril  20 mg Oral Daily  . divalproex  250 mg Oral q morning - 10a   And  . divalproex  500 mg Oral QHS  . docusate sodium  100 mg Oral BID  . doxycycline  100 mg Oral Q12H  . FLUoxetine  40 mg Oral Daily  . fluticasone  1 spray Each Nare Daily  . gabapentin  400 mg Oral TID  . insulin aspart  0-20 Units Subcutaneous TID WC  . isosorbide mononitrate  90 mg Oral Daily  . metFORMIN  500 mg Oral BID WC  . polyethylene glycol  17 g Oral Daily  . prasugrel  10 mg Oral Daily  . tamsulosin  0.4 mg Oral QPC breakfast  . traZODone  50 mg Oral QHS   Continuous Infusions: . lactated ringers 50 mL/hr at 09/26/13 [place], MD  [facility] Pager [phone]  If 7PM-7AM, please contact night-coverage [email or web address] Password [email or web address] 10/01/2013, 2:24 PM   LOS: 5 days

## 2013-10-01 NOTE — Telephone Encounter (Signed)
[facility] 

## 2013-10-01 NOTE — Progress Notes (Signed)
Subjective: 5 Days Post-Op Procedure(s) (LRB): ANTERIOR CERVICAL DISCECTOMY FUSION C3-4, plate and screw fixation, allograft bone graft (N/A) Patient reports pain as mild.   Had BM earlier today. Cough improved.   Discussed discharge plans and use of foley.  Will need leg bag Objective: Vital signs in last 24 hours: Temp:  [99.1 F (37.3 C)-100.3 F (37.9 C)] 100.3 F (37.9 C) (05/13 0551) Pulse Rate:  [78-88] 88 (05/13 0551) Resp:  [18] 18 (05/13 0551) BP: (101-125)/(40-82) 125/62 mmHg (05/13 0551) SpO2:  [98 %-100 %] 98 % (05/13 0551)  Intake/Output from previous day: 05/12 0701 - 05/13 0700 In: 450 [P.O.:450] Out: 4452 [Urine:4450; Stool:2] Intake/Output this shift:     Recent Labs  09/29/13 1410  HGB 11.3*    Recent Labs  09/29/13 1410  WBC 4.8  RBC 3.64*  HCT 33.9*  PLT 156    Recent Labs  09/29/13 1410  NA 136*  K 4.5  CL 97  CO2 28  BUN 13  CREATININE 0.82  GLUCOSE 110*  CALCIUM 9.0   No results found for this basename: LABPT, INR,  in the last 72 hours  Neurovascular intact Incision: no drainage  Assessment/Plan: 5 Days Post-Op Procedure(s) (LRB): ANTERIOR CERVICAL DISCECTOMY FUSION C3-4, plate and screw fixation, allograft bone graft (N/A) Discharge to SNF  Travis [name] 10/01/2013, 8:45 AM

## 2013-10-01 NOTE — Progress Notes (Signed)
Patient examined and lab reviewed with [place] PA-C. 

## 2013-10-01 NOTE — Progress Notes (Signed)
Patient ID: Jaysiah Marchetta, male   DOB: [birth year], 63 y.o.   MRN: [id number]  [facility] PROGRESS NOTE  Dawood Spitler [id number]:[id number] DOB: 1950-08-04 DOA: 09/24/2013 PCP: [name], MD  Brief narrative: Consult for post op fever.   Principal Problem:   Spinal stenosis in cervical region - management per primary team  Active Problems:   Post op fever - Tmax 100.3, blood culture to date negative - last CXR was 5/10 unremarkable - UA and urine culture negative  - doppler study negative for DVT    HYPERLIPIDEMIA - continue statin    HYPERTENSION - continue Imdur, Norvasc, Benazepril   CORONARY ARTERY DISEASE - clinically compensated   Type II or unspecified type diabetes mellitus without mention of complication, uncontrolled - continue Metformin and Insulin per home medical regimen    Acute urinary retention - UOP > 3000 over the past 24 hours    Acute bronchitis - possible source - continue Doxycycline   Antibiotics:  Doxycycline   Code Status: Full Family Communication: Pt at bedside Disposition Plan: Home when medically stable  HPI/Subjective: No events overnight.   Objective: Filed Vitals:   09/24/13 1107  BP: 112/70  Pulse: 79  Temp: 97.9 F (36.6 C)  Resp: 20  Height: 6' 1"  (1.854 m)  Weight: 130.908 kg (288 lb 9.6 oz)  SpO2: 95%   No intake or output data in the 24 hours ending 10/01/13 1430  Exam:   General:  Pt is alert, follows commands appropriately, not in acute distress  Cardiovascular: Regular rate and rhythm, S1/S2, no murmurs, no rubs, no gallops  Respiratory: Clear to auscultation bilaterally, no wheezing, no crackles, no rhonchi  Abdomen: Soft, non tender, non distended, bowel sounds present, no guarding  Data Reviewed: Basic Metabolic Panel:  Recent Labs Lab 09/27/13 0505 09/29/13 1410  NA 141 136*  K 4.0 4.5  CL 103 97  CO2 27 28  GLUCOSE 95 110*  BUN 12 13  CREATININE 0.86 0.82  CALCIUM 8.5 9.0   Liver  Function Tests:  Recent Labs Lab 09/29/13 1410  AST 43*  ALT 15  ALKPHOS 41  BILITOT 0.7  PROT 6.3  ALBUMIN 3.3*   CBC:  Recent Labs Lab 09/29/13 1410  WBC 4.8  NEUTROABS 3.1  HGB 11.3*  HCT 33.9*  MCV 93.1  PLT 156   CBG:  Recent Labs Lab 09/30/13 1151 09/30/13 1616 09/30/13 2156 10/01/13 0631 10/01/13 1122  GLUCAP 109* 124* 116* 123* 121*    Recent Results (from the past 240 hour(s))  SURGICAL PCR SCREEN     Status: Abnormal   Collection Time    09/24/13 11:34 AM      Result Value Ref Range Status   MRSA, PCR NEGATIVE  NEGATIVE Final   Staphylococcus aureus POSITIVE (*) NEGATIVE Final   Comment:            The Xpert SA Assay (FDA     approved for NASAL specimens     in patients over 28 years of age),     is one component of     a comprehensive surveillance     program.  Test performance has     been validated by [employer] for patients greater     than or equal to 45 year old.     It is not intended     to diagnose infection nor to     guide or monitor treatment.  CULTURE, BLOOD (ROUTINE X  2)     Status: None   Collection Time    09/29/13  2:10 PM      Result Value Ref Range Status   Specimen Description BLOOD LEFT ANTECUBITAL   Final   Special Requests     Final   Value: BOTTLES DRAWN AEROBIC AND ANAEROBIC 10CC BLUE, 5CC RED   Culture  Setup Time     Final   Value: 09/29/2013 16:29     Performed at [facility]   Culture     Final   Value:        BLOOD CULTURE RECEIVED NO GROWTH TO DATE CULTURE WILL BE HELD FOR 5 DAYS BEFORE ISSUING A FINAL NEGATIVE REPORT     Performed at [facility]   Report Status PENDING   Incomplete  CULTURE, BLOOD (ROUTINE X 2)     Status: None   Collection Time    09/29/13  2:15 PM      Result Value Ref Range Status   Specimen Description BLOOD LEFT HAND   Final   Special Requests BOTTLES DRAWN AEROBIC ONLY 5CC   Final   Culture  Setup Time     Final   Value: 09/29/2013 16:28     Performed  at [facility]   Culture     Final   Value:        BLOOD CULTURE RECEIVED NO GROWTH TO DATE CULTURE WILL BE HELD FOR 5 DAYS BEFORE ISSUING A FINAL NEGATIVE REPORT     Performed at [facility]   Report Status PENDING   Incomplete     Scheduled Meds:  Continuous Infusions:    Theodis Blaze, MD  [facility] Pager [phone]  If 7PM-7AM, please contact night-coverage [email or web address] Password [email or web address] 10/01/2013, 2:30 PM   LOS: 0 days

## 2013-10-01 NOTE — Progress Notes (Signed)
Physical Therapy Treatment Patient Details Name: Merril Nagy MRN: [id number] DOB: [birth year] Today's Date: 10/01/2013    History of Present Illness 63 y.o. male s/p ANTERIOR CERVICAL DISCECTOMY FUSION C3-4. Hx of HTN, CAD, and bilateral shoulder pain and continued pain in right hip with walking    PT Comments    Patient agreeable to ambulation. Patient was walking himself to bathroom when nursing and myself walked in. ENcouraged not to get up without assistance. Increased time to don brace and shoes. Patient unable to complete without assistance  Follow Up Recommendations  SNF;Supervision/Assistance - 24 hour     Equipment Recommendations  3in1 (PT)    Recommendations for Other Services       Precautions / Restrictions Precautions Precautions: Cervical;Fall Precaution Comments: History of falls Required Braces or Orthoses: Cervical Brace;Other Brace/Splint Cervical Brace: Soft collar Other Brace/Splint: "arizona" orthosis for ambulation on RLE    Mobility  Bed Mobility               General bed mobility comments: Patient walking to bathroom alone prior to PT and sittingin recliner afterwards  Transfers Overall transfer level: Needs assistance Equipment used: Rolling walker (2 wheeled)   Sit to Stand: Min guard         General transfer comment: cues for technique  Ambulation/Gait Ambulation/Gait assistance: Min guard Ambulation Distance (Feet): 200 Feet Assistive device: Rolling walker (2 wheeled) Gait Pattern/deviations: Step-through pattern;Narrow base of support;Trunk flexed;Antalgic Gait velocity: decreased   General Gait Details: Patient only requiring one standing rest break this session. Cues for upright gait throughtout   Stairs            Wheelchair Mobility    Modified Rankin (Stroke Patients Only)       Balance                                    Cognition Arousal/Alertness: Awake/alert Behavior During  Therapy: WFL for tasks assessed/performed Overall Cognitive Status: Within Functional Limits for tasks assessed                      Exercises      General Comments        Pertinent Vitals/Pain Denied pain.    Home Living                      Prior Function            PT Goals (current goals can now be found in the care plan section) Progress towards PT goals: Progressing toward goals    Frequency  Min 5X/week    PT Plan Current plan remains appropriate    Co-evaluation             End of Session Equipment Utilized During Treatment: Gait belt;Cervical collar;Other (comment) (foot brace) Activity Tolerance: Patient tolerated treatment well Patient left: in chair;with call bell/phone within reach     Time: 5277-8242 PT Time Calculation (min): 24 min  Charges:  $Gait Training: 23-37 mins                    G Codes:      Tonia Brooms Robinette 10/01/2013, 11:57 AM 10/01/2013 [place] PTA [phone] pager [phone] office

## 2013-10-02 ENCOUNTER — Non-Acute Institutional Stay (SKILLED_NURSING_FACILITY): Payer: Commercial Managed Care - HMO | Admitting: Internal Medicine

## 2013-10-02 DIAGNOSIS — M4802 Spinal stenosis, cervical region: Secondary | ICD-10-CM

## 2013-10-02 DIAGNOSIS — I2510 Atherosclerotic heart disease of native coronary artery without angina pectoris: Secondary | ICD-10-CM

## 2013-10-02 DIAGNOSIS — E1059 Type 1 diabetes mellitus with other circulatory complications: Secondary | ICD-10-CM

## 2013-10-02 DIAGNOSIS — D62 Acute posthemorrhagic anemia: Secondary | ICD-10-CM

## 2013-10-04 DIAGNOSIS — D62 Acute posthemorrhagic anemia: Secondary | ICD-10-CM | POA: Insufficient documentation

## 2013-10-04 DIAGNOSIS — E1165 Type 2 diabetes mellitus with hyperglycemia: Secondary | ICD-10-CM

## 2013-10-04 DIAGNOSIS — E1151 Type 2 diabetes mellitus with diabetic peripheral angiopathy without gangrene: Secondary | ICD-10-CM

## 2013-10-04 DIAGNOSIS — IMO0002 Reserved for concepts with insufficient information to code with codable children: Secondary | ICD-10-CM

## 2013-10-04 HISTORY — DX: Type 2 diabetes mellitus with diabetic peripheral angiopathy without gangrene: E11.65

## 2013-10-04 HISTORY — DX: Reserved for concepts with insufficient information to code with codable children: IMO0002

## 2013-10-04 HISTORY — DX: Type 2 diabetes mellitus with diabetic peripheral angiopathy without gangrene: E11.51

## 2013-10-04 NOTE — Progress Notes (Signed)
HISTORY & PHYSICAL  DATE: 10/02/2013   FACILITY: [facility]  LEVEL OF CARE: SNF (31)  ALLERGIES:  No Known Allergies  CHIEF COMPLAINT:  Manage cervical spinal stenosis, CAD and diabetes mellitus  HISTORY OF PRESENT ILLNESS: Patient is a 63 year old Caucasian male.  SPINAL STENOSIS: Patient's spinal stenoses remains stable. Patient denies ongoing low back pain, numbness, tingling or weakness. No complications reported from the medications currently being used. Patient had cervical spinal stenosis and underwent anterior cervical discectomy fusion C3-4, plate and screw fixation, allograft bone graft and tolerated the procedure well. He is admitted to this facility for short-term rehabilitation. He currently wears a soft cervical collar.  CAD: The angina has been stable. The patient denies dyspnea on exertion, orthopnea, palpitations and paroxysmal nocturnal dyspnea. No complications noted from the medication presently being used.  DM:pt's DM remains stable.  Pt denies polyuria, polydipsia, polyphagia, changes in vision or hypoglycemic episodes.  No complications noted from the medication presently being used.  Last hemoglobin A1c is: Not available.  PAST MEDICAL HISTORY :  Past Medical History  Diagnosis Date  . Impaired glucose tolerance 09/27/2010  . HYPERLIPIDEMIA-MIXED 07/15/2009  . Chronic pain syndrome 10/27/2009  . HYPERTENSION 06/24/2009  . CORONARY ARTERY DISEASE 06/24/2009    a. s/p multiple PCIs - In 2008 he had a Taxus DES to the mild LAD, Endeavor DES to mid LCX and distal LCX. In January 2009 he had DES to distal LCX, mid LCX and proximal LCX. In November 2009 had BMS x 2 to the mid RCA. Cath 10/2011 with patent stents, noncardiac CP. LHC 01/2013: patent stents (noncardiac CP).  [name] URETHRAL STRICTURE 06/24/2009  . SHOULDER PAIN, BILATERAL 06/24/2009  . ANKLE PAIN, RIGHT 06/24/2009  . SCIATICA, LEFT 04/19/2010  . NEPHROLITHIASIS, HX OF 06/24/2009  . ERECTILE  DYSFUNCTION, ORGANIC 05/30/2010  . Obesity   . Allergy to clopidogrel   . ALLERGIC RHINITIS 06/24/2009  . Anxiety   . Depression     Prior suicide attempt  . Irregular heart beat   . Pneumonia   . Self-catheterizes urinary bladder   . Anginal pain   . Myocardial infarction 1998  . OSA (obstructive sleep apnea)     not using CPAP (09/30/2013)  . Type II diabetes mellitus   . Hepatitis C dx'd 1979  . DEGENERATIVE JOINT DISEASE 06/24/2009  . [place] DISEASE, LUMBAR 04/19/2010  . Arthritis     "all my joints" (09/30/2013)  . Fibromyalgia     "left leg" (09/30/2013)  . Chronic back pain     "my whole back" (09/30/2013)  . Kidney stones     "passed on my own"    PAST SURGICAL HISTORY: Past Surgical History  Procedure Laterality Date  . Shoulder open rotator cuff repair Right X 2  . Total knee arthroplasty Bilateral 2008  . Carpal tunnel release Bilateral   . Urethral dilation  X 4  . Knee arthroscopy Right X 7  . Umbilical hernia repair      UHR  . Joint replacement Bilateral   . Hernia repair      umbilical  . Anterior cervical decomp/discectomy fusion N/A 09/26/2013    Procedure: ANTERIOR CERVICAL DISCECTOMY FUSION C3-4, plate and screw fixation, allograft bone graft;  Surgeon: Jessy Oto, MD;  Location: [facility];  Service: Orthopedics;  Laterality: N/A;  . Shoulder arthroscopy w/ rotator cuff repair Bilateral     "2 on the left; 1 on the  right"  . Tonsillectomy    . Knee cartilage surgery Right X 4    "open; not scopes"  . Knee cartilage surgery Left X 3    "open; not scopes"  . Coronary angioplasty with stent placement      "I have 9 stents"  . Cardiac catheterization  X 1    SOCIAL HISTORY:  reports that he quit smoking about 3 years ago. His smoking use included Cigars. He has never used smokeless tobacco. He reports that he drinks alcohol. He reports that he does not use illicit drugs.  FAMILY HISTORY:  Family History  Problem Relation Age of Onset  . Depression Mother    . Heart disease Mother   . Hypertension Mother   . Cancer Mother     Breast  . Diabetes Father   . Heart disease Father     CABG  . Cancer Father     prostate and skin cancer  . Hypertension Father   . Hyperlipidemia Father   . Depression Brother     x 2  . Hypertension Brother     x2  . Coronary artery disease Other   . Hypertension Other   . Depression Other   . Heart disease Maternal Grandfather   . Early death Maternal Grandfather 63    heart attack  . Early death Paternal Grandfather     CURRENT MEDICATIONS: Reviewed per MAR/see medication list  REVIEW OF SYSTEMS:  See HPI otherwise 14 point ROS is negative.  PHYSICAL EXAMINATION  VS:  See VS section  GENERAL: no acute distress, moderately obese body habitus EYES: conjunctivae normal, sclerae normal, normal eye lids MOUTH/THROAT: lips without lesions,no lesions in the mouth,tongue is without lesions,uvula elevates in midline NECK: Soft cervical collar in place LYMPHATICS: no LAN in the neck, no supraclavicular LAN RESPIRATORY: breathing is even & unlabored, BS CTAB CARDIAC: RRR, no murmur,no extra heart sounds, costume bilateral lower extremity edema GI:  ABDOMEN: abdomen soft, normal BS, no masses, no tenderness  LIVER/SPLEEN: no hepatomegaly, no splenomegaly MUSCULOSKELETAL: HEAD: normal to inspection & palpation BACK: no kyphosis, scoliosis or spinal processes tenderness EXTREMITIES: LEFT UPPER EXTREMITY: full range of motion, normal strength & tone RIGHT UPPER EXTREMITY: Moderate range of motion, normal strength & tone LEFT LOWER EXTREMITY:  full range of motion, normal strength & tone RIGHT LOWER EXTREMITY:  full range of motion, normal strength & tone PSYCHIATRIC: the patient is alert & oriented to person, affect & behavior appropriate  LABS/RADIOLOGY:  Labs reviewed: Basic Metabolic Panel:  Recent Labs  04/03/13 1359 05/03/13 0052  09/24/13 1155 09/27/13 0505 09/29/13 1410  NA 135 142  < >  140 141 136*  K 4.4 3.6  < > 4.5 4.0 4.5  CL 103 107  < > 102 103 97  CO2 25 28  < > 22 27 28   GLUCOSE 108* 130*  < > 111* 95 110*  BUN 21 20  < > 20 12 13   CREATININE 1.0 0.82  < > 1.03 0.86 0.82  CALCIUM 9.4 8.3*  < > 8.8 8.5 9.0  MG  --  1.9  --   --   --   --   < > = values in this interval not displayed. Liver Function Tests:  Recent Labs  04/03/13 1359 05/03/13 0052 09/29/13 1410  AST 22 13 43*  ALT 26 10 15   ALKPHOS 65 59 41  BILITOT 1.0 0.2* 0.7  PROT 7.0 6.0 6.3  ALBUMIN 4.2 3.2* 3.3*  Recent Labs  02/07/13 2155  LIPASE 16   CBC:  Recent Labs  03/20/13 1005 04/03/13 1359  09/04/13 0355 09/24/13 1155 09/29/13 1410  WBC 4.8 6.5  < > 6.1 3.9* 4.8  NEUTROABS 3.2 4.9  --   --   --  3.1  HGB 12.7* 13.7  < > 12.3* 12.8* 11.3*  HCT 36.7* 40.8  < > 35.8* 37.9* 33.9*  MCV 91.3 92.9  < > 90.2 92.0 93.1  PLT 211 288.0  < > 163 157 156  < > = values in this interval not displayed.  Lipid Panel:  Recent Labs  03/03/13 1607 04/03/13 1359  HDL 39.10 39.50   Cardiac Enzymes:  Recent Labs  02/07/13 2155 02/08/13 0237 02/08/13 0835 03/25/13 1539  CKTOTAL  --   --   --  60  TROPONINI <0.30 <0.30 <0.30  --    CBG:  Recent Labs  09/30/13 2156 10/01/13 0631 10/01/13 1122  GLUCAP 116* 123* 121*    CT NECK WITH CONTRAST   TECHNIQUE: Multidetector CT imaging of the neck was performed using the standard protocol following the bolus administration of intravenous contrast.   CONTRAST:  152m OMNIPAQUE IOHEXOL 300 MG/ML  SOLN   COMPARISON:  CT HEAD W/O CM dated 09/02/2013   FINDINGS: No retropharyngeal abscess. Multilevel cervical spondylosis and degenerative disc disease. Chronic right maxillary sinusitis.   Mild prominence of the adenoidal tissue and mildly prominent palatine tonsils. Lingual tonsils are prominent, right greater than left.   No pathologic adenopathy in the neck. Salivary glands appear symmetric.   Dominant right  vertebral artery. Main vascular structures in the neck are patent.   Thyroid gland unremarkable.  Glottic structures appear symmetric.   IMPRESSION: 1. Slight adenoidal, lingual tonsillar, and palatine tonsillar prominence, likely reactive. The airway does not appear particularly threatened. No pathologic adenopathy. 2. The lingual tonsillar hypertrophy is somewhat asymmetric, with the right larger than the left. It may be prudent to follow-up with direct visualization of the lingual tonsils in order to ensure resolution of presumably reactive adenopathy and exclude the unlikely possibility of a lingual tonsillar mass. 3. Cervical spondylosis and degenerative disc disease, with osseous foraminal narrowing most striking on the left at C3-4. 4. Chronic right maxillary sinusitis. MRI LUMBAR SPINE WITHOUT CONTRAST   TECHNIQUE: Multiplanar, multisequence MR imaging was performed. No intravenous contrast was administered.   COMPARISON:  MRI lumbar spine 07/24/2013 [facility].   FINDINGS: Normal signal is present in the conus medullaris which terminates at L1-2, within normal limits. Marrow signal is normal. Schmorl's nodes are present along the superior endplate of L3. Dextro convex scoliosis is centered at L3.   L1-2: A a right lateral disc protrusion and asymmetric right-sided facet hypertrophy is stable.   L2-3: A broad-based disc herniation is asymmetric to the left. Facet hypertrophy is asymmetric on the left. Mild left lateral recess narrowing is stable.   L3-4: A broad-based disc herniation is present. Moderate facet hypertrophy is noted bilaterally. Mild lateral recess narrowing bilaterally is stable, worse on the left. Mild left foraminal narrowing is stable as well.   L4-5: This is again the most severe level. A rightward disc herniation is present. Advanced facet hypertrophy is present bilaterally. Moderate central canal stenosis is  evident with right greater left lateral recess narrowing. Moderate foraminal stenosis is worse on the left.   L5-S1: There is chronic loss of disc height. Mild facet hypertrophy is present. There is no significant stenosis.  IMPRESSION: 1. No significant interval change. 2. Most severe level is again noted to the L4-5 with moderate central canal stenosis and right greater than left lateral recess narrowing. 3. Moderate foraminal stenosis is worse on the left L4-5.     MRI CERVICAL SPINE WITHOUT CONTRAST   TECHNIQUE: Multiplanar, multisequence MR imaging was performed. No intravenous contrast was administered.   COMPARISON:  MRI of the cervical spine 12/03/2009.   FINDINGS: Normal signal is present in the cervical and upper thoracic spinal cord the lowest imaged level. Craniocervical junction is within normal limits. The sphenoid sinuses are opacified. The visualized intracranial contents are normal.   C2-3: Mild left foraminal narrowing is due to uncovertebral and facet spurring. The central canal is patent.   C3-4: A broad-based disc osteophyte complex has progressed. There is contact and distortion of the left ventral surface of the cord. Moderate foraminal stenosis is present bilaterally.   C4-5: A mild broad-based disc osteophyte complex is present. Uncovertebral and facet spurring has progressed with mild to moderate foraminal narrowing bilaterally.   C5-6: Slight disc bulging is present without significant stenosis or change.   C6-7: A progressive broad-based disc osteophyte complex is asymmetric to the right. Mild central and right foraminal narrowing is present.   C7-T1: Asymmetric right-sided facet hypertrophy is present. There is no significant stenosis.   IMPRESSION: 1. Progressive broad-based disc osteophyte, plexus and left paramedian protrusion at C3-4 results and moderate central canal stenosis with contact and distortion of the left ventral  surface of the cord but no definite abnormal signal. 2. Moderate foraminal stenosis bilaterally at C3-4. 3. Progressive uncovertebral and facet spurring at C4-5 with mild to moderate foraminal stenosis bilaterally. 4. Progressive broad-based disc osteophyte complex with no mild central and right foraminal stenosis at C6-7. 5. Progressive asymmetric right-sided facet hypertrophy at C7-T1 without significant stenosis.   CERVICAL SPINE - 2-3 VIEW   COMPARISON:  Cervical spine MRI 09/12/2013.   FINDINGS: Intraoperative portable cross-table lateral views of the cervical spine.   Film labeled #1 at 1347 hrs. Needle directed at the C3-C4 disc space.   Film labeled found to at 1458 hrs.  C3-C4 ACDF hardware in place.   IMPRESSION: C3-C4 ACDF depicted. CHEST  2 VIEW   COMPARISON:  09/02/2013   FINDINGS: The heart size and mediastinal contours are within normal limits. Both lungs are clear. The visualized skeletal structures are unremarkable.   IMPRESSION: No active cardiopulmonary disease.   ASSESSMENT/PLAN:  Cervical spinal stenosis-status post anterior cervical discectomy and fusion. Continue rehabilitation. CAD-stable Diabetes mellitus with vascular complications-continue current medications Acute blood loss anemia-recheck Hypertension-well-controlled Check CBC  I have reviewed patient's medical records received at admission/from hospitalization.  CPT CODE: 78938  Kacy Hegna Y Adian Jablonowski, [place] [phone]

## 2013-10-05 LAB — CULTURE, BLOOD (ROUTINE X 2)
CULTURE: NO GROWTH
Culture: NO GROWTH

## 2013-10-06 ENCOUNTER — Encounter: Payer: Self-pay | Admitting: *Deleted

## 2013-10-10 ENCOUNTER — Encounter: Payer: Self-pay | Admitting: Adult Health

## 2013-10-10 ENCOUNTER — Non-Acute Institutional Stay (SKILLED_NURSING_FACILITY): Payer: Commercial Managed Care - HMO | Admitting: Adult Health

## 2013-10-10 DIAGNOSIS — J309 Allergic rhinitis, unspecified: Secondary | ICD-10-CM

## 2013-10-10 DIAGNOSIS — F329 Major depressive disorder, single episode, unspecified: Secondary | ICD-10-CM

## 2013-10-10 DIAGNOSIS — F3289 Other specified depressive episodes: Secondary | ICD-10-CM

## 2013-10-10 DIAGNOSIS — E785 Hyperlipidemia, unspecified: Secondary | ICD-10-CM

## 2013-10-10 DIAGNOSIS — I2510 Atherosclerotic heart disease of native coronary artery without angina pectoris: Secondary | ICD-10-CM

## 2013-10-10 DIAGNOSIS — I10 Essential (primary) hypertension: Secondary | ICD-10-CM

## 2013-10-10 DIAGNOSIS — E1165 Type 2 diabetes mellitus with hyperglycemia: Secondary | ICD-10-CM

## 2013-10-10 DIAGNOSIS — IMO0001 Reserved for inherently not codable concepts without codable children: Secondary | ICD-10-CM

## 2013-10-10 DIAGNOSIS — M4802 Spinal stenosis, cervical region: Secondary | ICD-10-CM

## 2013-10-10 NOTE — Progress Notes (Signed)
Patient ID: Travis [name], male   DOB: 1950-09-14, 63 y.o.   MRN: [id number]              PROGRESS NOTE  DATE: 10/10/2013   FACILITY: [facility]  LEVEL OF CARE: SNF (31)  Acute Visit  CHIEF COMPLAINT:  Discharge in Notes  HISTORY OF PRESENT ILLNESS: This is a 63 year old male who is for discharge home with Home health PT and OT. He has been admitted to [facility] on 10/01/13 from [facility] with Spinal Stenosis in cervical region S/P anterior cervical discectomy C3-4. Patient was admitted to this facility for short-term rehabilitation after the patient's recent hospitalization.  Patient has completed SNF rehabilitation and therapy has cleared the patient for discharge.  Reassessment of ongoing problem(s):  HTN: Pt 's HTN remains stable.  Denies CP, sob, DOE, pedal edema, headaches, dizziness or visual disturbances.  No complications from the medications currently being used.  Last BP : 130/80  CAD: The angina has been stable. The patient denies dyspnea on exertion, orthopnea, pedal edema, palpitations and paroxysmal nocturnal dyspnea. No complications noted from the medication presently being used.  DM:pt's DM remains stable.  Pt denies polyuria, polydipsia, polyphagia, changes in vision or hypoglycemic episodes.  No complications noted from the medication presently being used.  4/15 hemoglobin A1c is: 5.3   PAST MEDICAL HISTORY : Reviewed.  No changes/see problem list  CURRENT MEDICATIONS: Reviewed per MAR/see medication list  REVIEW OF SYSTEMS:  GENERAL: no change in appetite, no fatigue, no weight changes, no fever, chills or weakness RESPIRATORY: no cough, SOB, DOE, wheezing, hemoptysis CARDIAC: no chest pain, edema or palpitations GI: no abdominal pain, diarrhea, constipation, heart burn, nausea or vomiting  PHYSICAL EXAMINATION  GENERAL: no acute distress, normal body habitus EYES: conjunctivae normal, sclerae normal, normal eye lids NECK:  supple, trachea midline, no neck masses, no thyroid tenderness, no thyromegaly LYMPHATICS: no LAN in the neck, no supraclavicular LAN RESPIRATORY: breathing is even & unlabored, BS CTAB CARDIAC: RRR, no murmur,no extra heart sounds, no edema GI: abdomen soft, normal BS, no masses, no tenderness, no hepatomegaly, no splenomegaly EXTREMITIES: able to move all 4 extremities PSYCHIATRIC: the patient is alert & oriented to person, affect & behavior appropriate  LABS/RADIOLOGY: Labs reviewed: Basic Metabolic Panel:  Recent Labs  04/03/13 1359 05/03/13 0052  09/24/13 1155 09/27/13 0505 09/29/13 1410  NA 135 142  < > 140 141 136*  K 4.4 3.6  < > 4.5 4.0 4.5  CL 103 107  < > 102 103 97  CO2 25 28  < > 22 27 28   GLUCOSE 108* 130*  < > 111* 95 110*  BUN 21 20  < > 20 12 13   CREATININE 1.0 0.82  < > 1.03 0.86 0.82  CALCIUM 9.4 8.3*  < > 8.8 8.5 9.0  MG  --  1.9  --   --   --   --   < > = values in this interval not displayed.  Liver Function Tests:  Recent Labs  04/03/13 1359 05/03/13 0052 09/29/13 1410  AST 22 13 43*  ALT 26 10 15   ALKPHOS 65 59 41  BILITOT 1.0 0.2* 0.7  PROT 7.0 6.0 6.3  ALBUMIN 4.2 3.2* 3.3*    Recent Labs  02/07/13 2155  LIPASE 16   CBC:  Recent Labs  03/20/13 1005 04/03/13 1359  09/04/13 0355 09/24/13 1155 09/29/13 1410  WBC 4.8 6.5  < > 6.1 3.9* 4.8  NEUTROABS 3.2 4.9  --   --   --  3.1  HGB 12.7* 13.7  < > 12.3* 12.8* 11.3*  HCT 36.7* 40.8  < > 35.8* 37.9* 33.9*  MCV 91.3 92.9  < > 90.2 92.0 93.1  PLT 211 288.0  < > 163 157 156  < > = values in this interval not displayed.   Lipid Panel:  Recent Labs  03/03/13 1607 04/03/13 1359  HDL 39.10 39.50   Cardiac Enzymes:  Recent Labs  02/07/13 2155 02/08/13 0237 02/08/13 0835 03/25/13 1539  CKTOTAL  --   --   --  60  TROPONINI <0.30 <0.30 <0.30  --    CBG:  Recent Labs  09/30/13 2156 10/01/13 0631 10/01/13 1122  GLUCAP 116* 123* 121*     ASSESSMENT/PLAN:  Spinal  stenosis in cervical region S/P anterior cervical discectomy Fusion C3-4 - for Home health PT, OT and Nursing. Hypertension - well-controlled; continue Lisinopril, Imdur and Amlodipine Depression - continue Prozac Allergic Rhinitis - stable; continue Flonase Diabetes Mellitus, type 2 - well-controlled; continue Metformin Hyperlipidemia - continue Atorvastatin CAD -  Continue Prasugrel    I have filled out patient's discharge paperwork and written prescriptions.  Patient will receive home health PT, OT and Nursing.  Total discharge time: Less than 30 minutes Discharge time involved coordination of the discharge process with Education officer, museum, nursing staff and therapy department. Medical justification for home health services verified.  CPT CODE: 44461  Seth Bake - NP [facility] [phone]

## 2013-10-11 ENCOUNTER — Other Ambulatory Visit: Payer: Self-pay | Admitting: Adult Health

## 2013-10-12 NOTE — Progress Notes (Signed)
Clinical social worker assisted with patient discharge to skilled nursing facility, [facility].  CSW addressed all family questions and concerns. CSW copied chart and added all important documents. CSW also set up patient transportation with Diplomatic Services operational officer. Clinical Social Worker will sign off for now as social work intervention is no longer needed.   Rhea Pink, MSW, [place]

## 2013-10-14 ENCOUNTER — Other Ambulatory Visit (HOSPITAL_COMMUNITY): Payer: Self-pay | Admitting: Adult Health

## 2013-10-15 ENCOUNTER — Ambulatory Visit (INDEPENDENT_AMBULATORY_CARE_PROVIDER_SITE_OTHER): Payer: Commercial Managed Care - HMO | Admitting: Internal Medicine

## 2013-10-15 ENCOUNTER — Encounter: Payer: Self-pay | Admitting: Internal Medicine

## 2013-10-15 VITALS — BP 114/78 | HR 75 | Temp 98.4°F | Wt 257.5 lb

## 2013-10-15 DIAGNOSIS — R51 Headache: Secondary | ICD-10-CM

## 2013-10-15 DIAGNOSIS — L03115 Cellulitis of right lower limb: Secondary | ICD-10-CM | POA: Insufficient documentation

## 2013-10-15 DIAGNOSIS — I10 Essential (primary) hypertension: Secondary | ICD-10-CM

## 2013-10-15 DIAGNOSIS — L03119 Cellulitis of unspecified part of limb: Secondary | ICD-10-CM

## 2013-10-15 DIAGNOSIS — R519 Headache, unspecified: Secondary | ICD-10-CM | POA: Insufficient documentation

## 2013-10-15 DIAGNOSIS — L02419 Cutaneous abscess of limb, unspecified: Secondary | ICD-10-CM

## 2013-10-15 MED ORDER — TAMSULOSIN HCL 0.4 MG PO CAPS: 0.4000 mg | ORAL_CAPSULE | Freq: Every day | ORAL | Status: DC

## 2013-10-15 MED ORDER — CYCLOBENZAPRINE HCL 5 MG PO TABS: 5.0000 mg | ORAL_TABLET | Freq: Three times a day (TID) | ORAL | Status: DC | PRN

## 2013-10-15 MED ORDER — DOXYCYCLINE HYCLATE 100 MG PO TABS: 100.0000 mg | ORAL_TABLET | Freq: Two times a day (BID) | ORAL | Status: DC

## 2013-10-15 MED ORDER — CLONAZEPAM 2 MG PO TABS: ORAL_TABLET | ORAL | Status: DC

## 2013-10-15 MED ORDER — DIVALPROEX SODIUM 250 MG PO DR TAB: 250.0000 mg | DELAYED_RELEASE_TABLET | Freq: Two times a day (BID) | ORAL | Status: DC

## 2013-10-15 NOTE — Assessment & Plan Note (Signed)
Mild early likely related to recent abrasions with fall - Mild to mod, for antibx course,  to f/u any worsening symptoms or concerns

## 2013-10-15 NOTE — Assessment & Plan Note (Addendum)
With fall, left sided head abrasion, slowed mentation and nasuea, prob concussion, for head ct - no CM. - r/o sdh; cont effient for now
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
## 2013-10-15 NOTE — Assessment & Plan Note (Signed)
stable overall by history and exam, recent data reviewed with pt, and pt to continue medical treatment as before,  to f/u any worsening symptoms or concerns BP Readings from Last 3 Encounters:  10/15/13 114/78  10/10/13 130/80  10/04/13 111/66

## 2013-10-15 NOTE — Progress Notes (Signed)
Pre visit review using our clinic review tool, if applicable. No additional management support is needed unless otherwise documented below in the visit note. 

## 2013-10-15 NOTE — Patient Instructions (Signed)
Please take all new medication as prescribed - the antibiotic  Please continue all other medications as before, and refills have been done if requested - the depakote, klonopin, and flomax  Please have the pharmacy call with any other refills you may need.  You will be contacted regarding the referral for: CT head (no contrast)  - to see [facility] now  Please keep your appointments with your specialists as you have planned

## 2013-10-16 ENCOUNTER — Telehealth: Payer: Self-pay | Admitting: Internal Medicine

## 2013-10-16 NOTE — Telephone Encounter (Signed)
Relevant patient education assigned to patient using Emmi. ° °

## 2013-10-21 ENCOUNTER — Other Ambulatory Visit: Payer: Self-pay

## 2013-10-23 ENCOUNTER — Other Ambulatory Visit (HOSPITAL_COMMUNITY): Payer: Self-pay | Admitting: Psychiatry

## 2013-10-24 ENCOUNTER — Encounter (HOSPITAL_COMMUNITY): Payer: Self-pay | Admitting: Specialist

## 2013-10-24 NOTE — OR Nursing (Signed)
Late entry on 10-24-2013 by Etheleen Mayhew, RN to add the procedure end time.

## 2013-10-26 ENCOUNTER — Other Ambulatory Visit (HOSPITAL_COMMUNITY): Payer: Self-pay | Admitting: Psychiatry

## 2013-10-26 NOTE — Telephone Encounter (Signed)
Given on 08/13/13 for 90 days. Too soon to refill.

## 2013-10-30 ENCOUNTER — Ambulatory Visit (INDEPENDENT_AMBULATORY_CARE_PROVIDER_SITE_OTHER): Payer: Commercial Managed Care - HMO | Admitting: Psychology

## 2013-10-30 DIAGNOSIS — F331 Major depressive disorder, recurrent, moderate: Secondary | ICD-10-CM

## 2013-10-31 ENCOUNTER — Telehealth: Payer: Self-pay

## 2013-10-31 MED ORDER — ZOLPIDEM TARTRATE ER 12.5 MG PO TBCR: 12.5000 mg | EXTENDED_RELEASE_TABLET | Freq: Every evening | ORAL | Status: DC | PRN

## 2013-10-31 NOTE — Telephone Encounter (Signed)
Ok for change - Done hardcopy to [employer]

## 2013-10-31 NOTE — Telephone Encounter (Signed)
Faxed hardcopy to [employer] .  Patient informed ok for change.

## 2013-10-31 NOTE — Telephone Encounter (Signed)
The patient phoned in this morning stating he is not sleeping well and that Trazodone is not working.  He would like to change to Zolpidem.

## 2013-11-13 ENCOUNTER — Other Ambulatory Visit (HOSPITAL_COMMUNITY): Payer: Self-pay | Admitting: Psychiatry

## 2013-11-13 ENCOUNTER — Encounter (HOSPITAL_COMMUNITY): Payer: Self-pay | Admitting: Psychiatry

## 2013-11-13 ENCOUNTER — Ambulatory Visit (INDEPENDENT_AMBULATORY_CARE_PROVIDER_SITE_OTHER): Payer: Commercial Managed Care - HMO | Admitting: Psychiatry

## 2013-11-13 ENCOUNTER — Ambulatory Visit (INDEPENDENT_AMBULATORY_CARE_PROVIDER_SITE_OTHER): Payer: Commercial Managed Care - HMO | Admitting: Psychology

## 2013-11-13 VITALS — BP 135/66 | HR 69 | Ht 73.0 in | Wt 282.0 lb

## 2013-11-13 DIAGNOSIS — F331 Major depressive disorder, recurrent, moderate: Secondary | ICD-10-CM

## 2013-11-13 DIAGNOSIS — F39 Unspecified mood [affective] disorder: Secondary | ICD-10-CM

## 2013-11-13 DIAGNOSIS — F319 Bipolar disorder, unspecified: Secondary | ICD-10-CM

## 2013-11-13 MED ORDER — TRAZODONE HCL 100 MG PO TABS: 100.0000 mg | ORAL_TABLET | Freq: Every day | ORAL | Status: DC

## 2013-11-13 MED ORDER — FLUOXETINE HCL 40 MG PO CAPS: 40.0000 mg | ORAL_CAPSULE | Freq: Every day | ORAL | Status: DC

## 2013-11-13 MED ORDER — ARIPIPRAZOLE 5 MG PO TABS
5.0000 mg | ORAL_TABLET | Freq: Every day | ORAL | Status: DC
Start: 2013-11-13 — End: 2014-01-13

## 2013-11-13 NOTE — Progress Notes (Signed)
[place] [phone] Progress Note  Travis [name] [id number] y.o.  11/13/2013 12:30 PM  Chief Complaint:  Medication management and followup.    History of Present Illness:  Travis [name] came for his followup appointment.  He had neck surgery in April .  He is still recovering from surgery and he is to have pain .  He has difficulty walking and he may require surgery for his leg .  Patient is taking his Abilify, Prozac Depakote but recently his primary care physician stopped trazodone and tried Ambien .   He mentioned that his Ambien is not covered by his insurance.  He struggles sleeping.  Most of his psychotropic medication is prescribed by his primary care physician Dr. Ronnald Ramp.  He has a refill remaining on his Depakote .  He is also taking Klonopin 2 mg twice a day which is prescribed by his primary care doctor with additional 5 refills remaining.  Patient is concerned about his pain medication.  He is scheduled to see a specialist on July 20 .  Patient told he is prescribed oxycodone every 4 hour and he is getting 240 tablets every month but he is not sure if his new pain specialist will continue the same amount.  He is also taking muscle relaxants , lidocaine patch .  Overall he believes his depression is good.  He denies any irritability or any anger.  He is moving to his girlfriend's house in [facility].  He is excited that his girlfriend is paying for his [facility] trip and even money to play gambling.  Patient is volunteer with football coaching but lately he has difficulty walking.  Patient is on disability.  He denies any aggression, violence, mood swing.  He denies any paranoia or any hallucination.  He is compliant with his medication .  He was to continue his current medication and current physician even though he is moving to [place].  He denies drinking or using any illegal substances.  He has no tremors or shakes.  He is seeing therapist Dr. Cheryln Manly for counseling.     Suicidal Ideation: No Plan Formed: No Patient has means to carry out plan: No  Homicidal Ideation: No Plan Formed: No Patient has means to carry out plan: No  Past Psychiatric History/Hospitalization(s) Patient started seeing a therapist since age 78.  He endorsed long history of depression and mania.[name]  He was in the treatment when he was in [place] then in [place] and then in [place] and also in [place].  He remembered taking psychotropic medication for at least 15-20 years.  In the past he had tried Effexor, Xanax, Klonopin, Ambien , Lunesta, Wellbutrin, Ambien and Remeron.  He never tried mood stabilizer until recently they started him on Abilify.  Patient endorses history of mood swings, irritability, poor impulse control and risky behavior.  He spent $13,000 on his friend which was given to him to use only as needed.  Patient also endorsed history of auditory hallucination on and off in the past.  He has one psychiatric hospitalization in February 2014 when he took overdose on Xanax with alcohol.  As per chart there is a history of at least 2 suicidal gestures in the past.  Patient has done at least twice intensive outpatient program. Anxiety: Yes Bipolar Disorder: Yes Depression: Yes Mania: Yes Psychosis: Yes Schizophrenia: No Personality Disorder: No Hospitalization for psychiatric illness: Yes History of Electroconvulsive Shock Therapy: No Prior Suicide Attempts: Yes  Medical History; Patient has  hypertension, coronary artery disease, allergic rhinitis, type 2 diabetes mellitus, left leg pain, degenerative joint disease, chronic back pain, urethral stricture, and about his function, hyperlipidemia, nephrolithiasis and chronic fatigue.  His primary care physician is Dr. Kandee Keen.  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  He spent a good amount of time in [place].  He moved back to [place] on a 5 years ago.  He married 5  times.  He has 2 children however he has no contact with them.  Patient told they don't talk to him anymore.  Patient has been in a relationship multiple times in the past 2 years.  Patient currently lives by himself.  Review of Systems: Psychiatric: Agitation: No Hallucination: No Depressed Mood: No Insomnia: No Hypersomnia: No Altered Concentration: No Feels Worthless: No Grandiose Ideas: No Belief In Special Powers: No New/Increased Substance Abuse: No Compulsions: No  Neurologic: Headache: No Seizure: No Paresthesias: Yes    Outpatient Encounter Prescriptions as of 11/13/2013  Medication Sig  . amLODipine (NORVASC) 5 MG tablet Take 5 mg by mouth daily.  . ARIPiprazole (ABILIFY) 5 MG tablet Take 1 tablet (5 mg total) by mouth daily.  [name] aspirin 81 MG tablet Take 1 tablet (81 mg total) by mouth daily.  [name] atorvastatin (LIPITOR) 20 MG tablet Take 20 mg by mouth daily.  . benazepril (LOTENSIN) 20 MG tablet Take 20 mg by mouth daily.  . bisacodyl (DULCOLAX) 10 MG suppository Place 1 suppository (10 mg total) rectally daily as needed for moderate constipation.  . clonazePAM (KLONOPIN) 2 MG tablet Take one tablet by mouth twice daily as needed for anxiety  . cyclobenzaprine (FLEXERIL) 5 MG tablet Take 1 tablet (5 mg total) by mouth 3 (three) times daily as needed for muscle spasms.  . diclofenac sodium (VOLTAREN) 1 % GEL Apply 2 g topically 4 (four) times daily as needed. For pain  . divalproex (DEPAKOTE) 250 MG DR tablet Take 1-2 tablets (250-500 mg total) by mouth 2 (two) times daily. 250 mg in morning and 500 mg at bedtime  . docusate sodium 100 MG CAPS Take 100 mg by mouth 2 (two) times daily.  [name] FLUoxetine (PROZAC) 40 MG capsule Take 1 capsule (40 mg total) by mouth daily.  . fluticasone (FLONASE) 50 MCG/ACT nasal spray Place 1 spray into both nostrils daily.  [name] gabapentin (NEURONTIN) 400 MG capsule Take 800 mg by mouth 4 (four) times daily.  . isosorbide mononitrate (IMDUR) 60  MG 24 hr tablet Take 90 mg by mouth daily.  [name] lidocaine (LIDODERM) 5 % Place 1 patch onto the skin daily as needed. Remove & Discard patch within 12 hours or as directed by MD. For  Pain.  . metFORMIN (GLUCOPHAGE) 500 MG tablet Take by mouth 2 (two) times daily with a meal.  . oxyCODONE (ROXICODONE) 15 MG immediate release tablet Take one tablet by mouth every 4 hours as needed for severe pain  . prasugrel (EFFIENT) 10 MG TABS tablet Take 10 mg by mouth daily.  . sildenafil (VIAGRA) 50 MG tablet Take 1 tablet (50 mg total) by mouth daily as needed for erectile dysfunction.  . tamsulosin (FLOMAX) 0.4 MG CAPS capsule Take 1 capsule (0.4 mg total) by mouth daily after breakfast.  . traZODone (DESYREL) 100 MG tablet Take 1 tablet (100 mg total) by mouth at bedtime.  . [DISCONTINUED] ARIPiprazole (ABILIFY) 5 MG tablet Take 5 mg by mouth daily.  . [DISCONTINUED] doxycycline (VIBRA-TABS) 100  MG tablet Take 1 tablet (100 mg total) by mouth every 12 (twelve) hours.  . [DISCONTINUED] FLUoxetine (PROZAC) 40 MG capsule Take 40 mg by mouth daily.  . [DISCONTINUED] zolpidem (AMBIEN CR) 12.5 MG CR tablet Take 1 tablet (12.5 mg total) by mouth at bedtime as needed for sleep.    Physical Exam: Constitutional:  BP 135/66  Pulse 69  Ht 6' 1"  (1.854 m)  Wt 282 lb (127.914 kg)  BMI 37.21 kg/m2 Recent Results (from the past 2160 hour(s))  CBC     Status: Abnormal   Collection Time    09/02/13  3:23 PM      Result Value Ref Range   WBC 6.0  4.0 - 10.5 K/uL   RBC 3.98 (*) 4.22 - 5.81 MIL/uL   Hemoglobin 12.2 (*) 13.0 - 17.0 g/dL   HCT 36.6 (*) 39.0 - 52.0 %   MCV 92.0  78.0 - 100.0 fL   MCH 30.7  26.0 - 34.0 pg   MCHC 33.3  30.0 - 36.0 g/dL   RDW 13.6  11.5 - 15.5 %   Platelets 162  150 - 400 K/uL  BASIC METABOLIC PANEL     Status: Abnormal   Collection Time    09/02/13  3:23 PM      Result Value Ref Range   Sodium 138  137 - 147 mEq/L   Potassium 3.9  3.7 - 5.3 mEq/L   Chloride 104  96 - 112 mEq/L    CO2 23  19 - 32 mEq/L   Glucose, Bld 133 (*) 70 - 99 mg/dL   BUN 16  6 - 23 mg/dL   Creatinine, Ser 0.86  0.50 - 1.35 mg/dL   Calcium 8.7  8.4 - 10.5 mg/dL   GFR calc non Af Amer >90  >90 mL/min   GFR calc Af Amer >90  >90 mL/min   Comment: (NOTE)     The eGFR has been calculated using the CKD EPI equation.     This calculation has not been validated in all clinical situations.     eGFR's persistently <90 mL/min signify possible Chronic Kidney     Disease.  CULTURE, BLOOD (ROUTINE X 2)     Status: None   Collection Time    09/02/13  3:24 PM      Result Value Ref Range   Specimen Description BLOOD RIGHT FOREARM     Special Requests BOTTLES DRAWN AEROBIC AND ANAEROBIC 4ML     Culture  Setup Time       Value: 09/02/2013 20:55     Performed at [facility]   Culture       Value: NO GROWTH 5 DAYS     Performed at [facility]   Report Status 09/08/2013 FINAL    CULTURE, BLOOD (ROUTINE X 2)     Status: None   Collection Time    09/02/13  4:00 PM      Result Value Ref Range   Specimen Description BLOOD RIGHT HAND     Special Requests BOTTLES DRAWN AEROBIC ONLY 3ML     Culture  Setup Time       Value: 09/02/2013 20:55     Performed at [facility]   Culture       Value: NO GROWTH 5 DAYS     Performed at [facility]   Report Status 09/08/2013 FINAL    I-STAT TROPOININ, ED     Status: None   Collection Time  09/02/13  4:08 PM      Result Value Ref Range   Troponin i, poc 0.00  0.00 - 0.08 ng/mL   Comment 3            Comment: Due to the release kinetics of cTnI,     a negative result within the first hours     of the onset of symptoms does not rule out     myocardial infarction with certainty.     If myocardial infarction is still suspected,     repeat the test at appropriate intervals.  I-STAT CG4 LACTIC ACID, ED     Status: None   Collection Time    09/02/13  4:11 PM      Result Value Ref Range   Lactic Acid, Venous 1.32  0.5 - 2.2  mmol/L  URINALYSIS, ROUTINE W REFLEX MICROSCOPIC     Status: Abnormal   Collection Time    09/02/13  4:58 PM      Result Value Ref Range   Color, Urine AMBER (*) YELLOW   Comment: BIOCHEMICALS MAY BE AFFECTED BY COLOR   APPearance CLEAR  CLEAR   Specific Gravity, Urine 1.023  1.005 - 1.030   pH 5.5  5.0 - 8.0   Glucose, UA NEGATIVE  NEGATIVE mg/dL   Hgb urine dipstick NEGATIVE  NEGATIVE   Bilirubin Urine NEGATIVE  NEGATIVE   Ketones, ur NEGATIVE  NEGATIVE mg/dL   Protein, ur NEGATIVE  NEGATIVE mg/dL   Urobilinogen, UA 1.0  0.0 - 1.0 mg/dL   Nitrite NEGATIVE  NEGATIVE   Leukocytes, UA SMALL (*) NEGATIVE  URINE MICROSCOPIC-ADD ON     Status: None   Collection Time    09/02/13  4:58 PM      Result Value Ref Range   Squamous Epithelial / LPF RARE  RARE   WBC, UA 3-6  <3 WBC/hpf  CSF CELL COUNT WITH DIFFERENTIAL     Status: Abnormal   Collection Time    09/02/13  5:30 PM      Result Value Ref Range   Tube # 1     Color, CSF COLORLESS  COLORLESS   Appearance, CSF CLEAR (*) CLEAR   Supernatant NOT INDICATED     RBC Count, CSF 303 (*) 0 /cu mm   WBC, CSF 1  0 - 5 /cu mm   Segmented Neutrophils-CSF TOO FEW TO COUNT, SMEAR AVAILABLE FOR REVIEW  0 - 6 %   Comment: RARE LYMPHOCTYES  CSF CELL COUNT WITH DIFFERENTIAL     Status: Abnormal   Collection Time    09/02/13  5:30 PM      Result Value Ref Range   Tube # 4     Color, CSF COLORLESS  COLORLESS   Appearance, CSF CLEAR (*) CLEAR   Supernatant NOT INDICATED     RBC Count, CSF 26 (*) 0 /cu mm   WBC, CSF 1  0 - 5 /cu mm   Segmented Neutrophils-CSF TOO FEW TO COUNT, SMEAR AVAILABLE FOR REVIEW  0 - 6 %   Comment: RARE LYMPHOCYTES  CSF CULTURE     Status: None   Collection Time    09/02/13  5:30 PM      Result Value Ref Range   Specimen Description CSF     Special Requests NONE     Gram Stain       Value: WBC PRESENT, PREDOMINANTLY MONONUCLEAR     NO ORGANISMS SEEN     CYTOSPIN  Performed by [facility]     Performed  at [facility]   Culture       Value: NO GROWTH 3 DAYS     Performed at [facility]   Report Status 09/06/2013 FINAL    GRAM STAIN     Status: None   Collection Time    09/02/13  5:30 PM      Result Value Ref Range   Specimen Description CSF     Special Requests NONE     Gram Stain       Value: NO ORGANISMS SEEN     WBC PRESENT, PREDOMINANTLY MONONUCLEAR     CYTOSPIN     Gram Stain Report Called to,Read Back By and Verified With: A LEDWELL RN 1933 09/02/13 A NAVARRO   Report Status 09/02/2013 FINAL    GLUCOSE, CSF     Status: None   Collection Time    09/02/13  5:30 PM      Result Value Ref Range   Glucose, CSF 75  43 - 76 mg/dL  PROTEIN, CSF     Status: None   Collection Time    09/02/13  5:30 PM      Result Value Ref Range   Total  Protein, CSF 37  15 - 45 mg/dL  RAPID STREP SCREEN     Status: None   Collection Time    09/02/13  8:55 PM      Result Value Ref Range   Streptococcus, Group A Screen (Direct) NEGATIVE  NEGATIVE   Comment: (NOTE)     A Rapid Antigen test may result negative if the antigen level in the     sample is below the detection level of this test. The FDA has not     cleared this test as a stand-alone test therefore the rapid antigen     negative result has reflexed to a Group A Strep culture.  CULTURE, GROUP A STREP     Status: None   Collection Time    09/02/13  8:55 PM      Result Value Ref Range   Specimen Description THROAT     Special Requests NONE     Culture       Value: No Beta Hemolytic Streptococci Isolated     Performed at [facility]   Report Status 09/04/2013 FINAL    GLUCOSE, CAPILLARY     Status: Abnormal   Collection Time    09/03/13 12:42 AM      Result Value Ref Range   Glucose-Capillary 116 (*) 70 - 99 mg/dL  SEDIMENTATION RATE     Status: Abnormal   Collection Time    09/03/13  3:45 AM      Result Value Ref Range   Sed Rate 20 (*) 0 - 16 mm/hr  C-REACTIVE PROTEIN     Status: Abnormal    Collection Time    09/03/13  3:45 AM      Result Value Ref Range   CRP 10.7 (*) <0.60 mg/dL   Comment: Performed at [facility]     Status: None   Collection Time    09/03/13  3:45 AM      Result Value Ref Range   Hemoglobin A1C 5.3  <5.7 %   Comment: (NOTE)  According to the ADA Clinical Practice Recommendations for 2011, when     HbA1c is used as a screening test:      >=6.5%   Diagnostic of Diabetes Mellitus               (if abnormal result is confirmed)     5.7-6.4%   Increased risk of developing Diabetes Mellitus     References:Diagnosis and Classification of Diabetes Mellitus,Diabetes     VELF,8101,75(ZWCHE 1):S62-S69 and Standards of Medical Care in             Diabetes - 2011,Diabetes NIDP,8242,35 (Suppl 1):S11-S61.   Mean Plasma Glucose 105  <117 mg/dL   Comment: Performed at [facility]     Status: Abnormal   Collection Time    09/03/13  3:45 AM      Result Value Ref Range   Sodium 140  137 - 147 mEq/L   Potassium 4.0  3.7 - 5.3 mEq/L   Chloride 103  96 - 112 mEq/L   CO2 26  19 - 32 mEq/L   Glucose, Bld 124 (*) 70 - 99 mg/dL   BUN 12  6 - 23 mg/dL   Creatinine, Ser 0.76  0.50 - 1.35 mg/dL   Calcium 8.4  8.4 - 10.5 mg/dL   GFR calc non Af Amer >90  >90 mL/min   GFR calc Af Amer >90  >90 mL/min   Comment: (NOTE)     The eGFR has been calculated using the CKD EPI equation.     This calculation has not been validated in all clinical situations.     eGFR's persistently <90 mL/min signify possible Chronic Kidney     Disease.  CBC     Status: Abnormal   Collection Time    09/03/13  3:45 AM      Result Value Ref Range   WBC 5.6  4.0 - 10.5 K/uL   RBC 3.79 (*) 4.22 - 5.81 MIL/uL   Hemoglobin 12.1 (*) 13.0 - 17.0 g/dL   HCT 34.2 (*) 39.0 - 52.0 %   MCV 90.2  78.0 - 100.0 fL   MCH 31.9  26.0 - 34.0 pg   MCHC 35.4  30.0 - 36.0 g/dL    RDW 13.6  11.5 - 15.5 %   Platelets 165  150 - 400 K/uL  GLUCOSE, CAPILLARY     Status: Abnormal   Collection Time    09/03/13  7:29 AM      Result Value Ref Range   Glucose-Capillary 157 (*) 70 - 99 mg/dL   Comment 1 Notify RN     Comment 2 Documented in Chart    GLUCOSE, CAPILLARY     Status: Abnormal   Collection Time    09/03/13 11:56 AM      Result Value Ref Range   Glucose-Capillary 147 (*) 70 - 99 mg/dL   Comment 1 Notify RN     Comment 2 Documented in Chart    GLUCOSE, CAPILLARY     Status: Abnormal   Collection Time    09/03/13  6:24 PM      Result Value Ref Range   Glucose-Capillary 156 (*) 70 - 99 mg/dL   Comment 1 Notify RN     Comment 2 Documented in Chart    GLUCOSE, CAPILLARY     Status: Abnormal   Collection Time    09/03/13  9:16 PM      Result Value Ref Range  Glucose-Capillary 195 (*) 70 - 99 mg/dL  CBC     Status: Abnormal   Collection Time    09/04/13  3:55 AM      Result Value Ref Range   WBC 6.1  4.0 - 10.5 K/uL   RBC 3.97 (*) 4.22 - 5.81 MIL/uL   Hemoglobin 12.3 (*) 13.0 - 17.0 g/dL   HCT 35.8 (*) 39.0 - 52.0 %   MCV 90.2  78.0 - 100.0 fL   MCH 31.0  26.0 - 34.0 pg   MCHC 34.4  30.0 - 36.0 g/dL   RDW 13.1  11.5 - 15.5 %   Platelets 163  150 - 400 K/uL  BASIC METABOLIC PANEL     Status: Abnormal   Collection Time    09/04/13  3:55 AM      Result Value Ref Range   Sodium 140  137 - 147 mEq/L   Potassium 4.5  3.7 - 5.3 mEq/L   Chloride 103  96 - 112 mEq/L   CO2 27  19 - 32 mEq/L   Glucose, Bld 148 (*) 70 - 99 mg/dL   BUN 14  6 - 23 mg/dL   Creatinine, Ser 0.80  0.50 - 1.35 mg/dL   Calcium 8.6  8.4 - 10.5 mg/dL   GFR calc non Af Amer >90  >90 mL/min   GFR calc Af Amer >90  >90 mL/min   Comment: (NOTE)     The eGFR has been calculated using the CKD EPI equation.     This calculation has not been validated in all clinical situations.     eGFR's persistently <90 mL/min signify possible Chronic Kidney     Disease.  GLUCOSE, CAPILLARY      Status: Abnormal   Collection Time    09/04/13  7:32 AM      Result Value Ref Range   Glucose-Capillary 128 (*) 70 - 99 mg/dL   Comment 1 Notify RN     Comment 2 Documented in Chart    GLUCOSE, CAPILLARY     Status: Abnormal   Collection Time    09/04/13 11:10 AM      Result Value Ref Range   Glucose-Capillary 134 (*) 70 - 99 mg/dL  SURGICAL PCR SCREEN     Status: Abnormal   Collection Time    09/24/13 11:34 AM      Result Value Ref Range   MRSA, PCR NEGATIVE  NEGATIVE   Staphylococcus aureus POSITIVE (*) NEGATIVE   Comment:            The Xpert SA Assay (FDA     approved for NASAL specimens     in patients over 36 years of age),     is one component of     a comprehensive surveillance     program.  Test performance has     been validated by [employer] for patients greater     than or equal to 62 year old.     It is not intended     to diagnose infection nor to     guide or monitor treatment.  URINALYSIS, ROUTINE W REFLEX MICROSCOPIC     Status: None   Collection Time    09/24/13 11:34 AM      Result Value Ref Range   Color, Urine YELLOW  YELLOW   APPearance CLEAR  CLEAR   Specific Gravity, Urine 1.018  1.005 - 1.030   pH 5.5  5.0 -  8.0   Glucose, UA NEGATIVE  NEGATIVE mg/dL   Hgb urine dipstick NEGATIVE  NEGATIVE   Bilirubin Urine NEGATIVE  NEGATIVE   Ketones, ur NEGATIVE  NEGATIVE mg/dL   Protein, ur NEGATIVE  NEGATIVE mg/dL   Urobilinogen, UA 0.2  0.0 - 1.0 mg/dL   Nitrite NEGATIVE  NEGATIVE   Leukocytes, UA NEGATIVE  NEGATIVE   Comment: MICROSCOPIC NOT DONE ON URINES WITH NEGATIVE PROTEIN, BLOOD, LEUKOCYTES, NITRITE, OR GLUCOSE <1000 mg/dL.  BASIC METABOLIC PANEL     Status: Abnormal   Collection Time    09/24/13 11:55 AM      Result Value Ref Range   Sodium 140  137 - 147 mEq/L   Potassium 4.5  3.7 - 5.3 mEq/L   Chloride 102  96 - 112 mEq/L   CO2 22  19 - 32 mEq/L   Glucose, Bld 111 (*) 70 - 99 mg/dL   BUN 20  6 - 23 mg/dL   Creatinine, Ser 1.03   0.50 - 1.35 mg/dL   Calcium 8.8  8.4 - 10.5 mg/dL   GFR calc non Af Amer 76 (*) >90 mL/min   GFR calc Af Amer 89 (*) >90 mL/min   Comment: (NOTE)     The eGFR has been calculated using the CKD EPI equation.     This calculation has not been validated in all clinical situations.     eGFR's persistently <90 mL/min signify possible Chronic Kidney     Disease.  CBC     Status: Abnormal   Collection Time    09/24/13 11:55 AM      Result Value Ref Range   WBC 3.9 (*) 4.0 - 10.5 K/uL   RBC 4.12 (*) 4.22 - 5.81 MIL/uL   Hemoglobin 12.8 (*) 13.0 - 17.0 g/dL   HCT 37.9 (*) 39.0 - 52.0 %   MCV 92.0  78.0 - 100.0 fL   MCH 31.1  26.0 - 34.0 pg   MCHC 33.8  30.0 - 36.0 g/dL   RDW 13.8  11.5 - 15.5 %   Platelets 157  150 - 400 K/uL  GLUCOSE, CAPILLARY     Status: Abnormal   Collection Time    09/26/13 10:37 AM      Result Value Ref Range   Glucose-Capillary 103 (*) 70 - 99 mg/dL  GLUCOSE, CAPILLARY     Status: Abnormal   Collection Time    09/26/13 11:47 AM      Result Value Ref Range   Glucose-Capillary 114 (*) 70 - 99 mg/dL  GLUCOSE, CAPILLARY     Status: Abnormal   Collection Time    09/26/13 12:52 PM      Result Value Ref Range   Glucose-Capillary 112 (*) 70 - 99 mg/dL  GLUCOSE, CAPILLARY     Status: Abnormal   Collection Time    09/26/13  3:40 PM      Result Value Ref Range   Glucose-Capillary 130 (*) 70 - 99 mg/dL   Comment 1 Documented in Chart     Comment 2 Notify RN    HEMOGLOBIN A1C     Status: None   Collection Time    09/26/13  7:59 PM      Result Value Ref Range   Hemoglobin A1C 5.6  <5.7 %   Comment: (NOTE)  According to the ADA Clinical Practice Recommendations for 2011, when     HbA1c is used as a screening test:      >=6.5%   Diagnostic of Diabetes Mellitus               (if abnormal result is confirmed)     5.7-6.4%   Increased risk of developing Diabetes Mellitus      References:Diagnosis and Classification of Diabetes Mellitus,Diabetes     NOBS,9628,36(OQHUT 1):S62-S69 and Standards of Medical Care in             Diabetes - 2011,Diabetes MLYY,5035,46 (Suppl 1):S11-S61.   Mean Plasma Glucose 114  <117 mg/dL   Comment: Performed at [place], CAPILLARY     Status: Abnormal   Collection Time    09/26/13  8:58 PM      Result Value Ref Range   Glucose-Capillary 103 (*) 70 - 99 mg/dL  GLUCOSE, CAPILLARY     Status: None   Collection Time    09/27/13 12:29 AM      Result Value Ref Range   Glucose-Capillary 98  70 - 99 mg/dL  GLUCOSE, CAPILLARY     Status: Abnormal   Collection Time    09/27/13  4:36 AM      Result Value Ref Range   Glucose-Capillary 101 (*) 70 - 99 mg/dL  BASIC METABOLIC PANEL     Status: None   Collection Time    09/27/13  5:05 AM      Result Value Ref Range   Sodium 141  137 - 147 mEq/L   Potassium 4.0  3.7 - 5.3 mEq/L   Chloride 103  96 - 112 mEq/L   CO2 27  19 - 32 mEq/L   Glucose, Bld 95  70 - 99 mg/dL   BUN 12  6 - 23 mg/dL   Creatinine, Ser 0.86  0.50 - 1.35 mg/dL   Calcium 8.5  8.4 - 10.5 mg/dL   GFR calc non Af Amer >90  >90 mL/min   GFR calc Af Amer >90  >90 mL/min   Comment: (NOTE)     The eGFR has been calculated using the CKD EPI equation.     This calculation has not been validated in all clinical situations.     eGFR's persistently <90 mL/min signify possible Chronic Kidney     Disease.  GLUCOSE, CAPILLARY     Status: Abnormal   Collection Time    09/27/13  8:48 AM      Result Value Ref Range   Glucose-Capillary 104 (*) 70 - 99 mg/dL  GLUCOSE, CAPILLARY     Status: None   Collection Time    09/27/13 11:08 AM      Result Value Ref Range   Glucose-Capillary 86  70 - 99 mg/dL  GLUCOSE, CAPILLARY     Status: Abnormal   Collection Time    09/27/13  5:02 PM      Result Value Ref Range   Glucose-Capillary 130 (*) 70 - 99 mg/dL  GLUCOSE, CAPILLARY     Status: None   Collection Time     09/27/13  7:36 PM      Result Value Ref Range   Glucose-Capillary 99  70 - 99 mg/dL   Comment 1 Documented in Chart     Comment 2 Notify RN    GLUCOSE, CAPILLARY     Status: None   Collection Time    09/27/13  8:05 PM  Result Value Ref Range   Glucose-Capillary 97  70 - 99 mg/dL  GLUCOSE, CAPILLARY     Status: Abnormal   Collection Time    09/27/13 11:56 PM      Result Value Ref Range   Glucose-Capillary 114 (*) 70 - 99 mg/dL   Comment 1 Documented in Chart     Comment 2 Notify RN    GLUCOSE, CAPILLARY     Status: Abnormal   Collection Time    09/28/13  3:43 AM      Result Value Ref Range   Glucose-Capillary 111 (*) 70 - 99 mg/dL   Comment 1 Notify RN    GLUCOSE, CAPILLARY     Status: Abnormal   Collection Time    09/28/13  8:53 AM      Result Value Ref Range   Glucose-Capillary 124 (*) 70 - 99 mg/dL  GLUCOSE, CAPILLARY     Status: None   Collection Time    09/28/13 12:01 PM      Result Value Ref Range   Glucose-Capillary 83  70 - 99 mg/dL  GLUCOSE, CAPILLARY     Status: Abnormal   Collection Time    09/28/13  4:13 PM      Result Value Ref Range   Glucose-Capillary 135 (*) 70 - 99 mg/dL   Comment 1 Documented in Chart     Comment 2 Notify RN    URINALYSIS, ROUTINE W REFLEX MICROSCOPIC     Status: Abnormal   Collection Time    09/28/13  5:00 PM      Result Value Ref Range   Color, Urine YELLOW  YELLOW   APPearance CLEAR  CLEAR   Specific Gravity, Urine 1.020  1.005 - 1.030   pH 6.0  5.0 - 8.0   Glucose, UA NEGATIVE  NEGATIVE mg/dL   Hgb urine dipstick TRACE (*) NEGATIVE   Bilirubin Urine NEGATIVE  NEGATIVE   Ketones, ur NEGATIVE  NEGATIVE mg/dL   Protein, ur NEGATIVE  NEGATIVE mg/dL   Urobilinogen, UA 1.0  0.0 - 1.0 mg/dL   Nitrite NEGATIVE  NEGATIVE   Leukocytes, UA NEGATIVE  NEGATIVE  URINE MICROSCOPIC-ADD ON     Status: Abnormal   Collection Time    09/28/13  5:00 PM      Result Value Ref Range   Squamous Epithelial / LPF MANY (*) RARE   WBC, UA 3-6   <3 WBC/hpf  GLUCOSE, CAPILLARY     Status: Abnormal   Collection Time    09/28/13  8:32 PM      Result Value Ref Range   Glucose-Capillary 140 (*) 70 - 99 mg/dL  GLUCOSE, CAPILLARY     Status: Abnormal   Collection Time    09/29/13 12:48 AM      Result Value Ref Range   Glucose-Capillary 114 (*) 70 - 99 mg/dL  GLUCOSE, CAPILLARY     Status: Abnormal   Collection Time    09/29/13  4:20 AM      Result Value Ref Range   Glucose-Capillary 101 (*) 70 - 99 mg/dL  GLUCOSE, CAPILLARY     Status: None   Collection Time    09/29/13  8:03 AM      Result Value Ref Range   Glucose-Capillary 89  70 - 99 mg/dL  GLUCOSE, CAPILLARY     Status: Abnormal   Collection Time    09/29/13 11:49 AM      Result Value Ref Range   Glucose-Capillary 105 (*)  70 - 99 mg/dL  PRO B NATRIURETIC PEPTIDE     Status: None   Collection Time    09/29/13  2:10 PM      Result Value Ref Range   Pro B Natriuretic peptide (BNP) 93.1  0 - 125 pg/mL  CBC WITH DIFFERENTIAL     Status: Abnormal   Collection Time    09/29/13  2:10 PM      Result Value Ref Range   WBC 4.8  4.0 - 10.5 K/uL   RBC 3.64 (*) 4.22 - 5.81 MIL/uL   Hemoglobin 11.3 (*) 13.0 - 17.0 g/dL   HCT 33.9 (*) 39.0 - 52.0 %   MCV 93.1  78.0 - 100.0 fL   MCH 31.0  26.0 - 34.0 pg   MCHC 33.3  30.0 - 36.0 g/dL   RDW 13.4  11.5 - 15.5 %   Platelets 156  150 - 400 K/uL   Neutrophils Relative % 63  43 - 77 %   Neutro Abs 3.1  1.7 - 7.7 K/uL   Lymphocytes Relative 16  12 - 46 %   Lymphs Abs 0.8  0.7 - 4.0 K/uL   Monocytes Relative 14 (*) 3 - 12 %   Monocytes Absolute 0.7  0.1 - 1.0 K/uL   Eosinophils Relative 7 (*) 0 - 5 %   Eosinophils Absolute 0.4  0.0 - 0.7 K/uL   Basophils Relative 0  0 - 1 %   Basophils Absolute 0.0  0.0 - 0.1 K/uL  COMPREHENSIVE METABOLIC PANEL     Status: Abnormal   Collection Time    09/29/13  2:10 PM      Result Value Ref Range   Sodium 136 (*) 137 - 147 mEq/L   Potassium 4.5  3.7 - 5.3 mEq/L   Chloride 97  96 - 112 mEq/L    CO2 28  19 - 32 mEq/L   Glucose, Bld 110 (*) 70 - 99 mg/dL   BUN 13  6 - 23 mg/dL   Creatinine, Ser 0.82  0.50 - 1.35 mg/dL   Calcium 9.0  8.4 - 10.5 mg/dL   Total Protein 6.3  6.0 - 8.3 g/dL   Albumin 3.3 (*) 3.5 - 5.2 g/dL   AST 43 (*) 0 - 37 U/L   ALT 15  0 - 53 U/L   Alkaline Phosphatase 41  39 - 117 U/L   Total Bilirubin 0.7  0.3 - 1.2 mg/dL   GFR calc non Af Amer >90  >90 mL/min   GFR calc Af Amer >90  >90 mL/min   Comment: (NOTE)     The eGFR has been calculated using the CKD EPI equation.     This calculation has not been validated in all clinical situations.     eGFR's persistently <90 mL/min signify possible Chronic Kidney     Disease.  CULTURE, BLOOD (ROUTINE X 2)     Status: None   Collection Time    09/29/13  2:10 PM      Result Value Ref Range   Specimen Description BLOOD LEFT ANTECUBITAL     Special Requests       Value: BOTTLES DRAWN AEROBIC AND ANAEROBIC 10CC BLUE, 5CC RED   Culture  Setup Time       Value: 09/29/2013 16:29     Performed at [facility]   Culture       Value: NO GROWTH 5 DAYS     Performed at [facility]  Report Status 10/05/2013 FINAL    CULTURE, BLOOD (ROUTINE X 2)     Status: None   Collection Time    09/29/13  2:15 PM      Result Value Ref Range   Specimen Description BLOOD LEFT HAND     Special Requests BOTTLES DRAWN AEROBIC ONLY 5CC     Culture  Setup Time       Value: 09/29/2013 16:28     Performed at [facility]   Culture       Value: NO GROWTH 5 DAYS     Performed at [facility]   Report Status 10/05/2013 FINAL    GLUCOSE, CAPILLARY     Status: Abnormal   Collection Time    09/29/13  4:34 PM      Result Value Ref Range   Glucose-Capillary 109 (*) 70 - 99 mg/dL  GLUCOSE, CAPILLARY     Status: Abnormal   Collection Time    09/29/13  8:27 PM      Result Value Ref Range   Glucose-Capillary 112 (*) 70 - 99 mg/dL  GLUCOSE, CAPILLARY     Status: Abnormal   Collection Time    09/30/13  12:20 AM      Result Value Ref Range   Glucose-Capillary 111 (*) 70 - 99 mg/dL  GLUCOSE, CAPILLARY     Status: Abnormal   Collection Time    09/30/13  4:12 AM      Result Value Ref Range   Glucose-Capillary 113 (*) 70 - 99 mg/dL  GLUCOSE, CAPILLARY     Status: Abnormal   Collection Time    09/30/13  8:16 AM      Result Value Ref Range   Glucose-Capillary 147 (*) 70 - 99 mg/dL  GLUCOSE, CAPILLARY     Status: Abnormal   Collection Time    09/30/13 11:51 AM      Result Value Ref Range   Glucose-Capillary 109 (*) 70 - 99 mg/dL   Comment 1 Documented in Chart     Comment 2 Notify RN    GLUCOSE, CAPILLARY     Status: Abnormal   Collection Time    09/30/13  4:16 PM      Result Value Ref Range   Glucose-Capillary 124 (*) 70 - 99 mg/dL   Comment 1 Documented in Chart     Comment 2 Notify RN    GLUCOSE, CAPILLARY     Status: Abnormal   Collection Time    09/30/13  9:56 PM      Result Value Ref Range   Glucose-Capillary 116 (*) 70 - 99 mg/dL  GLUCOSE, CAPILLARY     Status: Abnormal   Collection Time    10/01/13  6:31 AM      Result Value Ref Range   Glucose-Capillary 123 (*) 70 - 99 mg/dL  GLUCOSE, CAPILLARY     Status: Abnormal   Collection Time    10/01/13 11:22 AM      Result Value Ref Range   Glucose-Capillary 121 (*) 70 - 99 mg/dL   Comment 1 Documented in Chart     Comment 2 Notify RN       Musculoskeletal: Strength & Muscle Tone: decreased Gait & Station: Difficulty walking due to pain Patient leans: Front  Mental Status Examination;  Patient is casually dressed and fairly groomed.  He maintains fair eye contact.  His speech is coherent.  He described his mood as good and his affect is improved from the  past.  He denies any suicidal thoughts or homicidal thoughts.  He denies any auditory hallucination or visual hallucination.  There were no paranoia , delusions or any of the session.  He denies any tremors or shakes.  His fund of knowledge is adequate.  He is alert  and oriented x3.  His insight judgment and impulse control is fair.  Established Problem, Stable/Improving (1), Review of Last Therapy Session (1) and Review of Medication Regimen & Side Effects (2)  Assessment: Axis I: Mood disorder NOS, rule out bipolar disorder depressed type  Axis II: Deferred  Axis III:  Past Medical History  Diagnosis Date  . Impaired glucose tolerance 09/27/2010  . HYPERLIPIDEMIA-MIXED 07/15/2009  . Chronic pain syndrome 10/27/2009  . HYPERTENSION 06/24/2009  . CORONARY ARTERY DISEASE 06/24/2009    a. s/p multiple PCIs - In 2008 he had a Taxus DES to the mild LAD, Endeavor DES to mid LCX and distal LCX. In January 2009 he had DES to distal LCX, mid LCX and proximal LCX. In November 2009 had BMS x 2 to the mid RCA. Cath 10/2011 with patent stents, noncardiac CP. LHC 01/2013: patent stents (noncardiac CP).  [name] URETHRAL STRICTURE 06/24/2009  . SHOULDER PAIN, BILATERAL 06/24/2009  . ANKLE PAIN, RIGHT 06/24/2009  . SCIATICA, LEFT 04/19/2010  . NEPHROLITHIASIS, HX OF 06/24/2009  . ERECTILE DYSFUNCTION, ORGANIC 05/30/2010  . Obesity   . Allergy to clopidogrel   . ALLERGIC RHINITIS 06/24/2009  . Anxiety   . Depression     Prior suicide attempt  . Irregular heart beat   . Pneumonia   . Self-catheterizes urinary bladder   . Anginal pain   . Myocardial infarction 1998  . OSA (obstructive sleep apnea)     not using CPAP (09/30/2013)  . Type II diabetes mellitus   . Hepatitis C dx'd 1979  . DEGENERATIVE JOINT DISEASE 06/24/2009  . [place] DISEASE, LUMBAR 04/19/2010  . Arthritis     "all my joints" (09/30/2013)  . Fibromyalgia     "left leg" (09/30/2013)  . Chronic back pain     "my whole back" (09/30/2013)  . Kidney stones     "passed on my own"    Axis IV: Moderate   Plan:  I reviewed his discharge summary, progress note on current medication along with blood work results.  His taking multiple medication including narcotic pain medication.  His Neurontin , Klonopin, Depakote as  given by his primary care physician .  I told that his primary care physician and also Depakote level since he has not done in a while.  Patient has enough refill on these medications .  However he will require Abilify, Prozac .  I will discontinue Ambien since his insurance does not cover the Ambien.  I will restart trazodone 100 mg at bedtime which he had taken in the past .  Discuss polypharmacy, risks and benefits of medication and drug drug interaction.  Discussed benzodiazepine tolerance and withdrawal.  Recommended to continue seeing Dr Cheryln Manly for counseling.  A new prescription of Abilify, trazodone and Prozac as given .  I will see him again in 3 months.  Time spent 25 minutes.  More than 50% of the time spent in psychoeducation, counseling and coordination of care.  Discuss safety plan that anytime having active suicidal thoughts or homicidal thoughts then patient need to call 911 or go to the local emergency room.   Haven Foss T., MD 11/13/2013

## 2013-11-14 NOTE — OR Nursing (Signed)
Addendum to scope page

## 2013-12-01 ENCOUNTER — Other Ambulatory Visit: Payer: Self-pay | Admitting: Adult Health

## 2013-12-05 ENCOUNTER — Ambulatory Visit (INDEPENDENT_AMBULATORY_CARE_PROVIDER_SITE_OTHER): Payer: Commercial Managed Care - HMO | Admitting: Psychology

## 2013-12-05 DIAGNOSIS — F331 Major depressive disorder, recurrent, moderate: Secondary | ICD-10-CM

## 2013-12-15 ENCOUNTER — Telehealth: Payer: Self-pay | Admitting: Internal Medicine

## 2013-12-15 NOTE — Telephone Encounter (Signed)
Unfort, I am all booked unless there is a cancellation, and I am not working thur/fri this wk  Can another MD see Him?

## 2013-12-15 NOTE — Telephone Encounter (Signed)
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 No appt available, please advise. He need this by 12/20/13.

## 2013-12-16 NOTE — Telephone Encounter (Signed)
Left vm for pt to callback 

## 2013-12-26 ENCOUNTER — Ambulatory Visit: Payer: Commercial Managed Care - HMO | Admitting: Psychology

## 2014-01-09 ENCOUNTER — Ambulatory Visit (INDEPENDENT_AMBULATORY_CARE_PROVIDER_SITE_OTHER): Payer: Medicare HMO | Admitting: Psychology

## 2014-01-09 ENCOUNTER — Telehealth: Payer: Self-pay | Admitting: Cardiology

## 2014-01-09 DIAGNOSIS — F331 Major depressive disorder, recurrent, moderate: Secondary | ICD-10-CM

## 2014-01-09 NOTE — Telephone Encounter (Signed)
New problem   Pt need clearance to have back surgery for 1st week of September. Please advise pt

## 2014-01-12 NOTE — Telephone Encounter (Signed)
Per Dr Colman Cater no new cardiac symptoms, had cath 9/14, was OK, may proceed with surgery.  Pt states no new cardiac symptoms.  Surgical clearance form completed by Dr Aundra Dubin and returned to HIM to be faxed to [employer].

## 2014-01-13 ENCOUNTER — Other Ambulatory Visit (HOSPITAL_COMMUNITY): Payer: Self-pay | Admitting: Specialist

## 2014-01-13 ENCOUNTER — Encounter (HOSPITAL_COMMUNITY): Payer: Self-pay | Admitting: Psychiatry

## 2014-01-13 ENCOUNTER — Ambulatory Visit (INDEPENDENT_AMBULATORY_CARE_PROVIDER_SITE_OTHER): Payer: Commercial Managed Care - HMO | Admitting: Psychiatry

## 2014-01-13 ENCOUNTER — Telehealth: Payer: Self-pay | Admitting: Cardiology

## 2014-01-13 VITALS — BP 119/62 | HR 56 | Ht 73.0 in | Wt 280.0 lb

## 2014-01-13 DIAGNOSIS — F39 Unspecified mood [affective] disorder: Secondary | ICD-10-CM

## 2014-01-13 DIAGNOSIS — F319 Bipolar disorder, unspecified: Secondary | ICD-10-CM

## 2014-01-13 MED ORDER — FLUOXETINE HCL 40 MG PO CAPS: 40.0000 mg | ORAL_CAPSULE | Freq: Every day | ORAL | Status: DC

## 2014-01-13 MED ORDER — ARIPIPRAZOLE 5 MG PO TABS: 5.0000 mg | ORAL_TABLET | Freq: Every day | ORAL | Status: DC

## 2014-01-13 MED ORDER — TRAZODONE HCL 100 MG PO TABS: 100.0000 mg | ORAL_TABLET | Freq: Every day | ORAL | Status: DC

## 2014-01-13 NOTE — Progress Notes (Signed)
[place] Progress Note  Travis [name] [id number] 63 y.o.  01/13/2014 11:31 AM  Chief Complaint:  Medication management and followup.    History of Present Illness:  Travis [name] came for his followup appointment.  He is concerned about his new relationship which is not working really well .  He liked to go back to his own place.  He is taking his medication as prescribed.  He is getting Abilify, Prozac and trazodone from [place].  On his last visit we started him on trazodone because Ambien was not covered from the insurance.  He is sleeping better.  He is scheduled to have back surgery in September and then for surgery in November.  He continues to take a lot of pain medication along with Depakote and Klonopin as prescribed by his primary care physician Dr. [name].  He denies any agitation, anger, mood swing.  He is sleeping good.  He denies any tremors or shakes.  Patient is on disability.  He is seeing therapist Dr. Cheryln Manly for counseling.    Suicidal Ideation: No Plan Formed: No Patient has means to carry out plan: No  Homicidal Ideation: No Plan Formed: No Patient has means to carry out plan: No  Past Psychiatric History/Hospitalization(s) Patient started seeing a therapist since age 67.  He endorsed long history of depression and mania.[name]  He was in the treatment when he was in [place] then in [place] and then in [place] and also in [place].  He remembered taking psychotropic medication for at least 15-20 years.  In the past he had tried Effexor, Xanax, Klonopin, Ambien , Lunesta, Wellbutrin, Ambien and Remeron.  He was started on Abilify from this office. Patient endorses history of mood swings, auditory hallucination, irritability, poor impulse control and risky behavior.  He spent $13,000 on his friend which was given to him to use only as needed.  He has one psychiatric hospitalization in February 2014 when he took overdose on Xanax with alcohol.  As per chart  there is a history of at least 2 suicidal gestures in the past.  Patient has done at least twice intensive outpatient program. Anxiety: Yes Bipolar Disorder: Yes Depression: Yes Mania: Yes Psychosis: Yes Schizophrenia: No Personality Disorder: No Hospitalization for psychiatric illness: Yes History of Electroconvulsive Shock Therapy: No Prior Suicide Attempts: Yes  Medical History; Patient has hypertension, coronary artery disease, allergic rhinitis, type 2 diabetes mellitus, left leg pain, degenerative joint disease, chronic back pain, urethral stricture, and about his function, hyperlipidemia, nephrolithiasis and chronic fatigue.  His primary care physician is Dr. Kandee Keen.  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  He spent a good amount of time in [place].  He moved back to [place].  He married 5 times.  He has 2 children however he has no contact with them.  Patient told they don't talk to him anymore.  Patient has been in a relationship multiple times in the past 2 years.  Patient currently lives by himself.  Review of Systems: Psychiatric: Agitation: No Hallucination: No Depressed Mood: No Insomnia: No Hypersomnia: No Altered Concentration: No Feels Worthless: No Grandiose Ideas: No Belief In Special Powers: No New/Increased Substance Abuse: No Compulsions: No  Neurologic: Headache: No Seizure: No Paresthesias: Yes    Outpatient Encounter Prescriptions as of 01/13/2014  Medication Sig  . amLODipine (NORVASC) 5 MG tablet Take 5 mg by mouth daily.  . ARIPiprazole (ABILIFY) 5 MG tablet Take  1 tablet (5 mg total) by mouth daily.  [name] aspirin 81 MG tablet Take 1 tablet (81 mg total) by mouth daily.  [name] atorvastatin (LIPITOR) 20 MG tablet Take 20 mg by mouth daily.  . benazepril (LOTENSIN) 20 MG tablet Take 20 mg by mouth daily.  . bisacodyl (DULCOLAX) 10 MG suppository Place 1 suppository (10 mg total)  rectally daily as needed for moderate constipation.  . clonazePAM (KLONOPIN) 2 MG tablet Take one tablet by mouth twice daily as needed for anxiety  . cyclobenzaprine (FLEXERIL) 5 MG tablet Take 1 tablet (5 mg total) by mouth 3 (three) times daily as needed for muscle spasms.  . diclofenac sodium (VOLTAREN) 1 % GEL Apply 2 g topically 4 (four) times daily as needed. For pain  . divalproex (DEPAKOTE) 250 MG DR tablet Take 1-2 tablets (250-500 mg total) by mouth 2 (two) times daily. 250 mg in morning and 500 mg at bedtime  . docusate sodium 100 MG CAPS Take 100 mg by mouth 2 (two) times daily.  [name] FLUoxetine (PROZAC) 40 MG capsule Take 1 capsule (40 mg total) by mouth daily.  . fluticasone (FLONASE) 50 MCG/ACT nasal spray Place 1 spray into both nostrils daily.  [name] gabapentin (NEURONTIN) 400 MG capsule Take 800 mg by mouth 4 (four) times daily.  . isosorbide mononitrate (IMDUR) 60 MG 24 hr tablet Take 90 mg by mouth daily.  [name] lidocaine (LIDODERM) 5 % Place 1 patch onto the skin daily as needed. Remove & Discard patch within 12 hours or as directed by MD. For  Pain.  . metFORMIN (GLUCOPHAGE) 500 MG tablet Take by mouth 2 (two) times daily with a meal.  . oxyCODONE (ROXICODONE) 15 MG immediate release tablet Take one tablet by mouth every 4 hours as needed for severe pain  . prasugrel (EFFIENT) 10 MG TABS tablet Take 10 mg by mouth daily.  . sildenafil (VIAGRA) 50 MG tablet Take 1 tablet (50 mg total) by mouth daily as needed for erectile dysfunction.  . tamsulosin (FLOMAX) 0.4 MG CAPS capsule Take 1 capsule (0.4 mg total) by mouth daily after breakfast.  . traZODone (DESYREL) 100 MG tablet Take 1 tablet (100 mg total) by mouth at bedtime.  . [DISCONTINUED] ARIPiprazole (ABILIFY) 5 MG tablet Take 1 tablet (5 mg total) by mouth daily.  . [DISCONTINUED] FLUoxetine (PROZAC) 40 MG capsule Take 1 capsule (40 mg total) by mouth daily.  . [DISCONTINUED] traZODone (DESYREL) 100 MG tablet Take 1 tablet (100 mg  total) by mouth at bedtime.    Physical Exam: Constitutional:  BP 119/62  Pulse 56  Ht 6' 1"  (1.854 m)  Wt 280 lb (127.007 kg)  BMI 36.95 kg/m2 No results found for this or any previous visit (from the past 2160 hour(s)).   Musculoskeletal: Strength & Muscle Tone: decreased Gait & Station: Difficulty walking due to pain Patient leans: Front  Mental Status Examination;  Patient is casually dressed and fairly groomed.  He maintains fair eye contact.  His speech is coherent.  He described his mood neutral and his affect is mood appropriate.  He denies any suicidal thoughts or homicidal thoughts.  He denies any auditory hallucination or visual hallucination.  There were no paranoia , delusions or any of the session.  He denies any tremors or shakes.  His fund of knowledge is adequate.  He is alert and oriented x3.  His insight judgment and impulse control is fair.  Established Problem, Stable/Improving (1), Review of Last Therapy Session (1)  and Review of Medication Regimen & Side Effects (2)  Assessment: Axis I: Mood disorder NOS, rule out bipolar disorder depressed type  Axis II: Deferred  Axis III:  Past Medical History  Diagnosis Date  . Impaired glucose tolerance 09/27/2010  . HYPERLIPIDEMIA-MIXED 07/15/2009  . Chronic pain syndrome 10/27/2009  . HYPERTENSION 06/24/2009  . CORONARY ARTERY DISEASE 06/24/2009    a. s/p multiple PCIs - In 2008 he had a Taxus DES to the mild LAD, Endeavor DES to mid LCX and distal LCX. In January 2009 he had DES to distal LCX, mid LCX and proximal LCX. In November 2009 had BMS x 2 to the mid RCA. Cath 10/2011 with patent stents, noncardiac CP. LHC 01/2013: patent stents (noncardiac CP).  [name] URETHRAL STRICTURE 06/24/2009  . SHOULDER PAIN, BILATERAL 06/24/2009  . ANKLE PAIN, RIGHT 06/24/2009  . SCIATICA, LEFT 04/19/2010  . NEPHROLITHIASIS, HX OF 06/24/2009  . ERECTILE DYSFUNCTION, ORGANIC 05/30/2010  . Obesity   . Allergy to clopidogrel   . ALLERGIC RHINITIS  06/24/2009  . Anxiety   . Depression     Prior suicide attempt  . Irregular heart beat   . Pneumonia   . Self-catheterizes urinary bladder   . Anginal pain   . Myocardial infarction 1998  . OSA (obstructive sleep apnea)     not using CPAP (09/30/2013)  . Type II diabetes mellitus   . Hepatitis C dx'd 1979  . DEGENERATIVE JOINT DISEASE 06/24/2009  . [place] DISEASE, LUMBAR 04/19/2010  . Arthritis     "all my joints" (09/30/2013)  . Fibromyalgia     "left leg" (09/30/2013)  . Chronic back pain     "my whole back" (09/30/2013)  . Kidney stones     "passed on my own"    Axis IV: Moderate   Plan:  Patient is doing better on his current psychotropic medication.  He is taking multiple medication including narcotic pain medication.  His Neurontin , Klonopin, Depakote as given by his primary care physician .  He has no Depakote level in a while.  I suggested to talk to his premature physician for a Depakote level since he is getting Depakote from his primary care physician.  I will continue Abilify 5 mg daily, Prozac 40 mg daily and trazodone 100 mg at bedtime.  Recommended to continue seeing Dr Cheryln Manly for counseling.  A new prescription of Abilify, trazodone and Prozac is given.  I will see him again in 3 months.   Lennin Osmond T., MD 01/13/2014

## 2014-01-13 NOTE — Telephone Encounter (Signed)
Received request from Nurse fax box, documents faxed for surgical clearance. To: [employer] number: [phone] Attention: 8.25.15/km

## 2014-01-16 ENCOUNTER — Other Ambulatory Visit: Payer: Self-pay

## 2014-01-19 ENCOUNTER — Telehealth: Payer: Self-pay | Admitting: Internal Medicine

## 2014-01-19 ENCOUNTER — Encounter (HOSPITAL_COMMUNITY): Payer: Self-pay | Admitting: Pharmacy Technician

## 2014-01-19 DIAGNOSIS — G894 Chronic pain syndrome: Secondary | ICD-10-CM

## 2014-01-19 NOTE — Telephone Encounter (Signed)
Manuela Schwartz from [facility] called in, she wants to know if Dr Jenny Reichmann can provide a referral.  He has been coming there since 7/20 Please advise  Thank You!!!

## 2014-01-20 NOTE — Telephone Encounter (Signed)
Referral done per emr

## 2014-01-22 ENCOUNTER — Encounter (HOSPITAL_COMMUNITY)
Admission: RE | Admit: 2014-01-22 | Discharge: 2014-01-22 | Disposition: A | Discharge status: Home / Self Care | Payer: Medicare HMO | Source: Ambulatory Visit | Attending: Specialist | Admitting: Specialist

## 2014-01-22 ENCOUNTER — Encounter (HOSPITAL_COMMUNITY): Payer: Self-pay

## 2014-01-22 DIAGNOSIS — Z01818 Encounter for other preprocedural examination: Secondary | ICD-10-CM | POA: Diagnosis present

## 2014-01-22 DIAGNOSIS — M48061 Spinal stenosis, lumbar region without neurogenic claudication: Secondary | ICD-10-CM | POA: Insufficient documentation

## 2014-01-22 LAB — CBC
HCT: 36.4 % — ABNORMAL LOW (ref 39.0–52.0)
Hemoglobin: 12.5 g/dL — ABNORMAL LOW (ref 13.0–17.0)
MCH: 30.8 pg (ref 26.0–34.0)
MCHC: 34.3 g/dL (ref 30.0–36.0)
MCV: 89.7 fL (ref 78.0–100.0)
Platelets: 182 10*3/uL (ref 150–400)
RBC: 4.06 MIL/uL — ABNORMAL LOW (ref 4.22–5.81)
RDW: 12.5 % (ref 11.5–15.5)
WBC: 4 10*3/uL (ref 4.0–10.5)

## 2014-01-22 LAB — SURGICAL PCR SCREEN
MRSA, PCR: NEGATIVE
Staphylococcus aureus: NEGATIVE

## 2014-01-22 MED ORDER — CHLORHEXIDINE GLUCONATE 4 % EX LIQD: 60.0000 mL | Freq: Once | CUTANEOUS | Status: DC

## 2014-01-22 MED ORDER — DEXTROSE 5 % IV SOLN: 3.0000 g | INTRAVENOUS | Status: DC

## 2014-01-22 NOTE — H&P (Signed)
Travis [name] is an 63 y.o. male.      CHIEF COMPLAINT:  Back pain and some radiation in both lower extremities.    HISTORY OF PRESENT ILLNESS:  The patient is a 63 year old male.  He has undergone anterior cervical diskectomy and fusion at the C3-4 level for cervical stenosis.  He has done well following that surgery.  He had good improvement in his balance and his neck and shoulder pain.  He reports, though, that this morning he woke up and had some stiffness in his neck and pain. After going back and laying down for an hour or so the pain dissipated and he is not experiencing any discomfort presently.  He has no upper extremity discomfort.  He has no upper extremity anesthesia or weakness.    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  He reports that he could probably walk a quarter mile, but could not walk beyond that.  A year ago he reports that he was able to walk as much as seven miles at a time without discomfort.  He has problems of weakness in his legs with prolonged standing and feelings of fatigue into both lower extremities if he walks any distance.  He does have to lean on a cart when he is at a grocery store or at any merchant's facility.  He reports that if he does walk a quarter mile he has to find a place to sit.  His pain is severe into his back, but into his buttock and thighs on both sides and extending down below the knee.  He says that he does have some trouble with intermittent leaking and reports that there is urgency when he has to get to the bathroom and he has to get to the bathroom quickly.   The most recent study of his lumbar spine was done in April 2015.  This study shows significant lumbar spinal stenosis at the L4-5 level with crowding of the nerve roots and no CSF signal through this area.  There is disk prominence to  the right side within the canal narrowing the subarticular lateral recess on the right.  The left lateral recess is also significantly narrowed.  Moderate foraminal narrowing noted at left L4-5.  Mild spinal stenosis changes noted at the L3-4 level involving the subarticular lateral recesses.   ASSESSMENT:  The findings on this patient's study suggest that his claudication symptoms are likely related to stenosis at the L3-4 and L4-5 levels, the latter being more significant.  The patient's plain radiographs show a very minimal curvature of the lumbar spine of less than 5 degrees and apex to the right side at about the L2 level. Patient has had epidural steroid injections in the past, but these did not seem to provide him a great deal of relief in regard to his claudication and his lower extremity complaints.  Based on his MRI study I think that decompressive laminectomy would afford Travis [name] the best chance of relief of his claudication symptoms.  He wishes to proceed with lumbar surgery and we will go ahead and schedule this to be done in early September.  The risks of surgery, including risk of infection, bleeding, neurologic compromise were discussed with Travis [name].  The decompression will be carried out at the L4-5 level with central laminectomy and this will be extended  upwards to the L3-4 level where he will undergo lateral recess decompression with minimal resection of the central elements.  Foraminotomies will be performed at the L5 level.  He is currently in pain management through [facility].  He relates that his medications are being provided adequately.     Past Medical History  Diagnosis Date  . Impaired glucose tolerance 09/27/2010  . HYPERLIPIDEMIA-MIXED 07/15/2009  . Chronic pain syndrome 10/27/2009  . HYPERTENSION 06/24/2009  . CORONARY ARTERY DISEASE 06/24/2009    a. s/p multiple PCIs - In 2008 he had a Taxus DES to the mild LAD, Endeavor DES to mid LCX and distal LCX. In January 2009  he had DES to distal LCX, mid LCX and proximal LCX. In November 2009 had BMS x 2 to the mid RCA. Cath 10/2011 with patent stents, noncardiac CP. LHC 01/2013: patent stents (noncardiac CP).  [name] URETHRAL STRICTURE 06/24/2009  . SHOULDER PAIN, BILATERAL 06/24/2009  . ANKLE PAIN, RIGHT 06/24/2009  . SCIATICA, LEFT 04/19/2010  . NEPHROLITHIASIS, HX OF 06/24/2009  . ERECTILE DYSFUNCTION, ORGANIC 05/30/2010  . Obesity   . Allergy to clopidogrel   . ALLERGIC RHINITIS 06/24/2009  . Anxiety   . Depression     Prior suicide attempt  . Irregular heart beat   . Pneumonia   . Self-catheterizes urinary bladder   . Anginal pain   . Myocardial infarction 1998  . OSA (obstructive sleep apnea)     not using CPAP (09/30/2013)  . Type II diabetes mellitus   . Hepatitis C dx'd 1979  . DEGENERATIVE JOINT DISEASE 06/24/2009  . [place] DISEASE, LUMBAR 04/19/2010  . Arthritis     "all my joints" (09/30/2013)  . Fibromyalgia     "left leg" (09/30/2013)  . Chronic back pain     "my whole back" (09/30/2013)  . Kidney stones     "passed on my own"    Past Surgical History  Procedure Laterality Date  . Shoulder open rotator cuff repair Right X 2  . Total knee arthroplasty Bilateral 2008  . Carpal tunnel release Bilateral   . Urethral dilation  X 4  . Knee arthroscopy Right X 7  . Umbilical hernia repair      UHR  . Joint replacement Bilateral   . Hernia repair      umbilical  . Shoulder arthroscopy w/ rotator cuff repair Bilateral     "2 on the left; 1 on the right"  . Tonsillectomy    . Knee cartilage surgery Right X 4    "open; not scopes"  . Knee cartilage surgery Left X 3    "open; not scopes"  . Coronary angioplasty with stent placement      "I have 9 stents"  . Cardiac catheterization  X 1  . Anterior cervical decomp/discectomy fusion N/A 09/26/2013    Procedure: ANTERIOR CERVICAL DISCECTOMY FUSION C3-4, plate and screw fixation, allograft bone graft;  Surgeon: Jessy Oto, MD;  Location: [facility];  Service:  Orthopedics;  Laterality: N/A;    Family History  Problem Relation Age of Onset  . Depression Mother   . Heart disease Mother   . Hypertension Mother   . Cancer Mother     Breast  . Diabetes Father   . Heart disease Father     CABG  . Cancer Father     prostate and skin cancer  . Hypertension Father   . Hyperlipidemia Father   . Depression Brother     x  2  . Hypertension Brother     x2  . Coronary artery disease Other   . Hypertension Other   . Depression Other   . Heart disease Maternal Grandfather   . Early death Maternal Grandfather 20    heart attack  . Early death Paternal Grandfather    Social History:  reports that he quit smoking about 3 years ago. His smoking use included Cigars. He has never used smokeless tobacco. He reports that he drinks alcohol. He reports that he does not use illicit drugs.  Allergies: No Known Allergies  Medications Prior to Admission  Medication Sig Dispense Refill  . amLODipine (NORVASC) 5 MG tablet Take 5 mg by mouth daily.      . ARIPiprazole (ABILIFY) 5 MG tablet Take 5 mg by mouth daily.      [name] aspirin 81 MG tablet Take 1 tablet (81 mg total) by mouth daily.  30 tablet  0  . atorvastatin (LIPITOR) 20 MG tablet Take 20 mg by mouth daily.      . benazepril (LOTENSIN) 20 MG tablet Take 20 mg by mouth daily.      . clonazePAM (KLONOPIN) 2 MG tablet Take one tablet by mouth twice daily as needed for anxiety  60 tablet  5  . cyclobenzaprine (FLEXERIL) 5 MG tablet Take 1 tablet (5 mg total) by mouth 3 (three) times daily as needed for muscle spasms.  30 tablet  11  . divalproex (DEPAKOTE) 250 MG DR tablet Take 250-500 mg by mouth 2 (two) times daily. 250 mg in morning and 500 mg at bedtime      . FLUoxetine (PROZAC) 40 MG capsule Take 40 mg by mouth daily.      . fluticasone (FLONASE) 50 MCG/ACT nasal spray Place 1 spray into both nostrils daily.      [name] gabapentin (NEURONTIN) 400 MG capsule Take 800 mg by mouth 4 (four) times daily.      .  isosorbide mononitrate (IMDUR) 60 MG 24 hr tablet Take 60 mg by mouth daily.       . metFORMIN (GLUCOPHAGE) 500 MG tablet Take by mouth 2 (two) times daily with a meal.      . Omega-3 Fatty Acids (FISH OIL) 1000 MG CAPS Take 2 capsules by mouth daily.      [name] oxyCODONE (ROXICODONE) 15 MG immediate release tablet Take 30 mg by mouth every 4 (four) hours as needed for pain. Take one tablet by mouth every 4 hours as needed for severe pain      . prasugrel (EFFIENT) 10 MG TABS tablet Take 10 mg by mouth daily.      . sildenafil (VIAGRA) 50 MG tablet Take 1 tablet (50 mg total) by mouth daily as needed for erectile dysfunction.  10 tablet  0  . tamsulosin (FLOMAX) 0.4 MG CAPS capsule Take 0.4 mg by mouth daily after breakfast.      . traZODone (DESYREL) 100 MG tablet Take 100 mg by mouth at bedtime.        Results for orders placed during the hospital encounter of 01/27/14 (from the past 48 hour(s))  GLUCOSE, CAPILLARY     Status: Abnormal   Collection Time    01/27/14 10:44 AM      Result Value Ref Range   Glucose-Capillary 118 (*) 70 - 99 mg/dL  GLUCOSE, CAPILLARY     Status: Abnormal   Collection Time    01/27/14 11:58 AM      Result Value Ref  Range   Glucose-Capillary 116 (*) 70 - 99 mg/dL  GLUCOSE, CAPILLARY     Status: None   Collection Time    01/27/14  1:28 PM      Result Value Ref Range   Glucose-Capillary 96  70 - 99 mg/dL   Comment 1 Documented in Chart     Comment 2 Notify RN     No results found.  Review of Systems  Musculoskeletal: Positive for back pain.  Neurological: Positive for sensory change and focal weakness.       Lower extremities     Blood pressure 118/89, pulse 59, temperature 97.7 F (36.5 C), temperature source Oral, resp. rate 12, height 6' 1"  (1.854 m), weight 123.378 kg (272 lb), SpO2 95.00%. Physical Exam  Constitutional: He is oriented to person, place, and time. He appears well-developed and well-nourished.  HENT:  Head: Normocephalic and  atraumatic.  Eyes: EOM are normal. Pupils are equal, round, and reactive to light.  Cardiovascular: Normal rate.   Respiratory: Effort normal.  GI: Soft.  Musculoskeletal:     The lower extremity motor shows the right foot with an AFO intact and dorsiflexion of the right forefoot including EHL and EDC appear intact and normal.  Plantar flexion of the right foot are normal.  Left foot dorsiflexion and plantar flexion strength are normal.  Knee extension and flexion strength are normal.  Hip abduction, adduction and hip flexion testing are also normal.  He has incision scars of the bilateral total knee arthroplasty present.    Neurological: He is alert and oriented to person, place, and time.  Skin: Skin is warm and dry.     Assessment/Plan Spinal stenosis L3-4 and L4-5  PLAN:  Decompression L3-4 and L4-5.  Kiira Brach E 01/27/2014, 1:44 PM

## 2014-01-22 NOTE — Pre-Procedure Instructions (Signed)
Travis [name]  01/22/2014   Your procedure is scheduled on:  01/27/14  Report to [facility] Admitting at 1150 AM.  Call this number if you have problems the morning of surgery: [phone]   Remember:   Do not eat food or drink liquids after midnight.   Take these medicines the morning of surgery with A SIP OF WATER: amlodipine,abilify,depakote,prozac,flonase,neurontin,imdur,pain med.,flomax   Do not wear jewelry, make-up or nail polish.  Do not wear lotions, powders, or perfumes. You may wear deodorant.  Do not shave 48 hours prior to surgery. Men may shave face and neck.  Do not bring valuables to the hospital.  [facility] is not responsible                  for any belongings or valuables.               Contacts, dentures or bridgework may not be worn into surgery.  Leave suitcase in the car. After surgery it may be brought to your room.  For patients admitted to the hospital, discharge time is determined by your                treatment team.               Patients discharged the day of surgery will not be allowed to drive  home.  Name and phone number of your driver:   Special Instructions: Shower using CHG 2 nights before surgery and the night before surgery.  If you shower the day of surgery use CHG.  Use special wash - you have one bottle of CHG for all showers.  You should use approximately 1/3 of the bottle for each shower.   Please read over the following fact sheets that you were given: Pain Booklet, Coughing and Deep Breathing, MRSA Information and Surgical Site Infection Prevention

## 2014-01-22 NOTE — Pre-Procedure Instructions (Signed)
Travis [name]  01/22/2014   Your procedure is scheduled on:  01/27/14  Report to [facility] Admitting at 1150 AM.  Call this number if you have problems the morning of surgery: [phone]   Remember:   Do not eat food or drink liquids after midnight.   Take these medicines the morning of surgery with A SIP OF WATER: amlodipine,abilify,depakote,prozac,flonase,neurontin,imdur,pain med.,flomax   Do not wear jewelry, make-up or nail polish.  Do not wear lotions, powders, or perfumes. You may wear deodorant.  Do not shave 48 hours prior to surgery. Men may shave face and neck.  Do not bring valuables to the hospital.  [facility] is not responsible                  for any belongings or valuables.               Contacts, dentures or bridgework may not be worn into surgery.  Leave suitcase in the car. After surgery it may be brought to your room.  For patients admitted to the hospital, discharge time is determined by your                treatment team.               Patients discharged the day of surgery will not be allowed to drive  home.  Name and phone number of your driver:   Special Instructions: Shower using CHG 2 nights before surgery and the night before surgery.  If you shower the day of surgery use CHG.  Use special wash - you have one bottle of CHG for all showers.  You should use approximately 1/3 of the bottle for each shower.   Please read over the following fact sheets that you were given: Pain Booklet, Coughing and Deep Breathing, MRSA Information and Surgical Site Infection Prevention

## 2014-01-27 ENCOUNTER — Ambulatory Visit (HOSPITAL_COMMUNITY): Payer: Medicare HMO

## 2014-01-27 ENCOUNTER — Encounter (HOSPITAL_COMMUNITY)
Admission: RE | Disposition: A | Discharge status: Home / Self Care | Payer: Self-pay | Source: Ambulatory Visit | Attending: Specialist

## 2014-01-27 ENCOUNTER — Encounter (HOSPITAL_COMMUNITY): Payer: Self-pay | Admitting: Certified Registered Nurse Anesthetist

## 2014-01-27 ENCOUNTER — Observation Stay (HOSPITAL_COMMUNITY)
Admission: RE | Admit: 2014-01-27 | Discharge: 2014-01-31 | Disposition: A | Discharge status: Home / Self Care | Payer: Medicare HMO | Source: Ambulatory Visit | Attending: Specialist | Admitting: Specialist

## 2014-01-27 ENCOUNTER — Encounter (HOSPITAL_COMMUNITY): Payer: Medicare HMO | Admitting: Certified Registered Nurse Anesthetist

## 2014-01-27 ENCOUNTER — Ambulatory Visit (HOSPITAL_COMMUNITY): Payer: Medicare HMO | Admitting: Certified Registered Nurse Anesthetist

## 2014-01-27 DIAGNOSIS — E782 Mixed hyperlipidemia: Secondary | ICD-10-CM | POA: Diagnosis not present

## 2014-01-27 DIAGNOSIS — G894 Chronic pain syndrome: Secondary | ICD-10-CM | POA: Diagnosis not present

## 2014-01-27 DIAGNOSIS — Z79899 Other long term (current) drug therapy: Secondary | ICD-10-CM | POA: Diagnosis not present

## 2014-01-27 DIAGNOSIS — I2510 Atherosclerotic heart disease of native coronary artery without angina pectoris: Secondary | ICD-10-CM | POA: Diagnosis not present

## 2014-01-27 DIAGNOSIS — M545 Low back pain, unspecified: Secondary | ICD-10-CM | POA: Diagnosis not present

## 2014-01-27 DIAGNOSIS — G4733 Obstructive sleep apnea (adult) (pediatric): Secondary | ICD-10-CM | POA: Diagnosis not present

## 2014-01-27 DIAGNOSIS — F411 Generalized anxiety disorder: Secondary | ICD-10-CM | POA: Diagnosis not present

## 2014-01-27 DIAGNOSIS — Z7982 Long term (current) use of aspirin: Secondary | ICD-10-CM | POA: Insufficient documentation

## 2014-01-27 DIAGNOSIS — Q7649 Other congenital malformations of spine, not associated with scoliosis: Secondary | ICD-10-CM | POA: Diagnosis present

## 2014-01-27 DIAGNOSIS — I959 Hypotension, unspecified: Secondary | ICD-10-CM

## 2014-01-27 DIAGNOSIS — F329 Major depressive disorder, single episode, unspecified: Secondary | ICD-10-CM | POA: Diagnosis not present

## 2014-01-27 DIAGNOSIS — M48061 Spinal stenosis, lumbar region without neurogenic claudication: Principal | ICD-10-CM | POA: Insufficient documentation

## 2014-01-27 DIAGNOSIS — D62 Acute posthemorrhagic anemia: Secondary | ICD-10-CM

## 2014-01-27 DIAGNOSIS — N529 Male erectile dysfunction, unspecified: Secondary | ICD-10-CM | POA: Diagnosis not present

## 2014-01-27 DIAGNOSIS — IMO0001 Reserved for inherently not codable concepts without codable children: Secondary | ICD-10-CM | POA: Diagnosis not present

## 2014-01-27 DIAGNOSIS — I10 Essential (primary) hypertension: Secondary | ICD-10-CM | POA: Diagnosis not present

## 2014-01-27 DIAGNOSIS — R001 Bradycardia, unspecified: Secondary | ICD-10-CM

## 2014-01-27 DIAGNOSIS — I9589 Other hypotension: Secondary | ICD-10-CM | POA: Diagnosis not present

## 2014-01-27 DIAGNOSIS — Z87442 Personal history of urinary calculi: Secondary | ICD-10-CM | POA: Diagnosis not present

## 2014-01-27 DIAGNOSIS — M48062 Spinal stenosis, lumbar region with neurogenic claudication: Secondary | ICD-10-CM

## 2014-01-27 DIAGNOSIS — F3289 Other specified depressive episodes: Secondary | ICD-10-CM | POA: Diagnosis not present

## 2014-01-27 DIAGNOSIS — Z23 Encounter for immunization: Secondary | ICD-10-CM | POA: Diagnosis not present

## 2014-01-27 DIAGNOSIS — E119 Type 2 diabetes mellitus without complications: Secondary | ICD-10-CM | POA: Insufficient documentation

## 2014-01-27 DIAGNOSIS — M1289 Other specific arthropathies, not elsewhere classified, multiple sites: Secondary | ICD-10-CM | POA: Diagnosis not present

## 2014-01-27 DIAGNOSIS — I252 Old myocardial infarction: Secondary | ICD-10-CM | POA: Diagnosis not present

## 2014-01-27 DIAGNOSIS — F419 Anxiety disorder, unspecified: Secondary | ICD-10-CM

## 2014-01-27 HISTORY — PX: LUMBAR LAMINECTOMY/DECOMPRESSION MICRODISCECTOMY: SHX5026

## 2014-01-27 LAB — GLUCOSE, CAPILLARY
GLUCOSE-CAPILLARY: 118 mg/dL — AB (ref 70–99)
Glucose-Capillary: 116 mg/dL — ABNORMAL HIGH (ref 70–99)
Glucose-Capillary: 96 mg/dL (ref 70–99)
Glucose-Capillary: 108 mg/dL — ABNORMAL HIGH (ref 70–99)
Glucose-Capillary: 177 mg/dL — ABNORMAL HIGH (ref 70–99)

## 2014-01-27 SURGERY — LUMBAR LAMINECTOMY/DECOMPRESSION MICRODISCECTOMY
Anesthesia: General

## 2014-01-27 MED ORDER — CEFAZOLIN SODIUM 1-5 GM-% IV SOLN
1.0000 g | Freq: Three times a day (TID) | INTRAVENOUS | Status: AC
  Administered 2014-01-27 – 2014-01-28 (×2): 1 g via INTRAVENOUS
  Filled 2014-01-27 (×2): qty 50

## 2014-01-27 MED ORDER — DEXTROSE 5 % IV SOLN: 500.0000 mg | Freq: Four times a day (QID) | INTRAVENOUS | Status: DC | PRN

## 2014-01-27 MED ORDER — ATORVASTATIN CALCIUM 20 MG PO TABS
20.0000 mg | ORAL_TABLET | Freq: Every day | ORAL | Status: DC
  Administered 2014-01-27 – 2014-01-31 (×5): 20 mg via ORAL
  Filled 2014-01-27 (×5): qty 1

## 2014-01-27 MED ORDER — CEFAZOLIN SODIUM 1-5 GM-% IV SOLN
INTRAVENOUS | Status: AC
  Filled 2014-01-27: qty 50

## 2014-01-27 MED ORDER — HYDROMORPHONE HCL PF 1 MG/ML IJ SOLN
0.2500 mg | INTRAMUSCULAR | Status: DC | PRN
  Administered 2014-01-27 (×4): 0.5 mg via INTRAVENOUS

## 2014-01-27 MED ORDER — LIDOCAINE HCL (CARDIAC) 20 MG/ML IV SOLN
INTRAVENOUS | Status: AC
  Filled 2014-01-27: qty 5

## 2014-01-27 MED ORDER — HYDROMORPHONE HCL PF 1 MG/ML IJ SOLN
INTRAMUSCULAR | Status: AC
  Filled 2014-01-27: qty 2

## 2014-01-27 MED ORDER — ACETAMINOPHEN 325 MG PO TABS
650.0000 mg | ORAL_TABLET | ORAL | Status: DC | PRN
  Administered 2014-01-29: 650 mg via ORAL
  Filled 2014-01-27: qty 2

## 2014-01-27 MED ORDER — HEMOSTATIC AGENTS (NO CHARGE) OPTIME
TOPICAL | Status: DC | PRN
  Administered 2014-01-27: 1 via TOPICAL

## 2014-01-27 MED ORDER — FLUTICASONE PROPIONATE 50 MCG/ACT NA SUSP
1.0000 | Freq: Every day | NASAL | Status: DC
Start: 2014-01-27 — End: 2014-01-31
  Administered 2014-01-27 – 2014-01-31 (×3): 1 via NASAL
  Filled 2014-01-27: qty 16

## 2014-01-27 MED ORDER — LACTATED RINGERS IV SOLN
INTRAVENOUS | Status: DC
  Administered 2014-01-27: 12:00:00 via INTRAVENOUS

## 2014-01-27 MED ORDER — SODIUM CHLORIDE 0.9 % IJ SOLN: 3.0000 mL | INTRAMUSCULAR | Status: DC | PRN

## 2014-01-27 MED ORDER — PANTOPRAZOLE SODIUM 40 MG IV SOLR
40.0000 mg | Freq: Every day | INTRAVENOUS | Status: DC
  Administered 2014-01-27: 40 mg via INTRAVENOUS
  Filled 2014-01-27 (×2): qty 40

## 2014-01-27 MED ORDER — TRAZODONE HCL 100 MG PO TABS
100.0000 mg | ORAL_TABLET | Freq: Every day | ORAL | Status: DC
  Administered 2014-01-27 – 2014-01-28 (×2): 100 mg via ORAL
  Filled 2014-01-27 (×3): qty 1

## 2014-01-27 MED ORDER — PROPOFOL 10 MG/ML IV BOLUS
INTRAVENOUS | Status: AC
  Filled 2014-01-27: qty 20

## 2014-01-27 MED ORDER — ROCURONIUM BROMIDE 50 MG/5ML IV SOLN
INTRAVENOUS | Status: AC
  Filled 2014-01-27: qty 1

## 2014-01-27 MED ORDER — CEFAZOLIN SODIUM-DEXTROSE 2-3 GM-% IV SOLR
INTRAVENOUS | Status: AC
  Administered 2014-01-27: 3 g via INTRAVENOUS
  Filled 2014-01-27: qty 50

## 2014-01-27 MED ORDER — ONDANSETRON HCL 4 MG/2ML IJ SOLN: 4.0000 mg | Freq: Once | INTRAMUSCULAR | Status: DC | PRN

## 2014-01-27 MED ORDER — ACETAMINOPHEN 650 MG RE SUPP: 650.0000 mg | RECTAL | Status: DC | PRN

## 2014-01-27 MED ORDER — ARTIFICIAL TEARS OP OINT
TOPICAL_OINTMENT | OPHTHALMIC | Status: DC | PRN
  Administered 2014-01-27: 1 via OPHTHALMIC

## 2014-01-27 MED ORDER — GLYCOPYRROLATE 0.2 MG/ML IJ SOLN
INTRAMUSCULAR | Status: DC | PRN
  Administered 2014-01-27: .8 mg via INTRAVENOUS

## 2014-01-27 MED ORDER — BISACODYL 10 MG RE SUPP: 10.0000 mg | Freq: Every day | RECTAL | Status: DC | PRN

## 2014-01-27 MED ORDER — LIDOCAINE HCL 4 % MT SOLN
OROMUCOSAL | Status: DC | PRN
  Administered 2014-01-27: 2 mL via TOPICAL

## 2014-01-27 MED ORDER — AMLODIPINE BESYLATE 5 MG PO TABS
5.0000 mg | ORAL_TABLET | Freq: Every day | ORAL | Status: DC
  Administered 2014-01-27 – 2014-01-28 (×2): 5 mg via ORAL
  Filled 2014-01-27 (×2): qty 1

## 2014-01-27 MED ORDER — ONDANSETRON HCL 4 MG/2ML IJ SOLN
4.0000 mg | INTRAMUSCULAR | Status: DC | PRN
Start: 2014-01-27 — End: 2014-01-31

## 2014-01-27 MED ORDER — BUPIVACAINE LIPOSOME 1.3 % IJ SUSP
20.0000 mL | Freq: Once | INTRAMUSCULAR | Status: DC
  Filled 2014-01-27: qty 20

## 2014-01-27 MED ORDER — OXYCODONE HCL 5 MG/5ML PO SOLN: 5.0000 mg | Freq: Once | ORAL | Status: DC | PRN

## 2014-01-27 MED ORDER — OXYCODONE HCL 5 MG PO TABS
30.0000 mg | ORAL_TABLET | ORAL | Status: DC | PRN
  Administered 2014-01-27 – 2014-01-28 (×4): 30 mg via ORAL
  Filled 2014-01-27 (×4): qty 6

## 2014-01-27 MED ORDER — THROMBIN 20000 UNITS EX KIT
PACK | CUTANEOUS | Status: AC
  Filled 2014-01-27: qty 1

## 2014-01-27 MED ORDER — ISOSORBIDE MONONITRATE ER 60 MG PO TB24
60.0000 mg | ORAL_TABLET | Freq: Every day | ORAL | Status: DC
  Administered 2014-01-27 – 2014-01-28 (×2): 60 mg via ORAL
  Filled 2014-01-27 (×2): qty 1

## 2014-01-27 MED ORDER — EPHEDRINE SULFATE 50 MG/ML IJ SOLN
INTRAMUSCULAR | Status: DC | PRN
  Administered 2014-01-27: 5 mg via INTRAVENOUS
  Administered 2014-01-27: 10 mg via INTRAVENOUS

## 2014-01-27 MED ORDER — SENNOSIDES-DOCUSATE SODIUM 8.6-50 MG PO TABS
1.0000 | ORAL_TABLET | Freq: Every evening | ORAL | Status: DC | PRN
Start: 2014-01-27 — End: 2014-01-29

## 2014-01-27 MED ORDER — ASPIRIN 81 MG PO CHEW
81.0000 mg | CHEWABLE_TABLET | Freq: Every day | ORAL | Status: DC
  Administered 2014-01-27 – 2014-01-31 (×5): 81 mg via ORAL
  Filled 2014-01-27 (×6): qty 1

## 2014-01-27 MED ORDER — FENTANYL CITRATE 0.05 MG/ML IJ SOLN
INTRAMUSCULAR | Status: AC
  Filled 2014-01-27: qty 2

## 2014-01-27 MED ORDER — SODIUM CHLORIDE 0.9 % IV SOLN: 250.0000 mL | INTRAVENOUS | Status: DC

## 2014-01-27 MED ORDER — FLUOXETINE HCL 20 MG PO CAPS
40.0000 mg | ORAL_CAPSULE | Freq: Every day | ORAL | Status: DC
  Administered 2014-01-27 – 2014-01-31 (×5): 40 mg via ORAL
  Filled 2014-01-27 (×5): qty 2

## 2014-01-27 MED ORDER — BUPIVACAINE LIPOSOME 1.3 % IJ SUSP
INTRAMUSCULAR | Status: DC | PRN
  Administered 2014-01-27: 20 mL

## 2014-01-27 MED ORDER — FENTANYL CITRATE 0.05 MG/ML IJ SOLN
INTRAMUSCULAR | Status: DC | PRN
  Administered 2014-01-27 (×2): 50 ug via INTRAVENOUS
  Administered 2014-01-27: 25 ug via INTRAVENOUS
  Administered 2014-01-27 (×2): 50 ug via INTRAVENOUS
  Administered 2014-01-27 (×2): 25 ug via INTRAVENOUS
  Administered 2014-01-27: 50 ug via INTRAVENOUS
  Administered 2014-01-27: 25 ug via INTRAVENOUS
  Administered 2014-01-27: 100 ug via INTRAVENOUS
  Administered 2014-01-27: 50 ug via INTRAVENOUS

## 2014-01-27 MED ORDER — ONDANSETRON HCL 4 MG/2ML IJ SOLN
INTRAMUSCULAR | Status: DC | PRN
  Administered 2014-01-27: 4 mg via INTRAVENOUS

## 2014-01-27 MED ORDER — FLEET ENEMA 7-19 GM/118ML RE ENEM: 1.0000 | ENEMA | Freq: Once | RECTAL | Status: AC | PRN

## 2014-01-27 MED ORDER — FENTANYL CITRATE 0.05 MG/ML IJ SOLN
INTRAMUSCULAR | Status: AC
  Administered 2014-01-27: 50 ug via INTRAVENOUS
  Filled 2014-01-27: qty 2

## 2014-01-27 MED ORDER — LACTATED RINGERS IV SOLN
INTRAVENOUS | Status: DC | PRN
  Administered 2014-01-27 (×2): via INTRAVENOUS

## 2014-01-27 MED ORDER — OXYCODONE HCL 5 MG PO TABS: 5.0000 mg | ORAL_TABLET | Freq: Once | ORAL | Status: DC | PRN

## 2014-01-27 MED ORDER — INSULIN ASPART 100 UNIT/ML ~~LOC~~ SOLN: 0.0000 [IU] | Freq: Every day | SUBCUTANEOUS | Status: DC

## 2014-01-27 MED ORDER — METHOCARBAMOL 500 MG PO TABS
500.0000 mg | ORAL_TABLET | Freq: Four times a day (QID) | ORAL | Status: DC | PRN
  Administered 2014-01-27 – 2014-01-28 (×2): 500 mg via ORAL
  Filled 2014-01-27 (×2): qty 1

## 2014-01-27 MED ORDER — THROMBIN 20000 UNITS EX SOLR
OROMUCOSAL | Status: DC | PRN
  Administered 2014-01-27: 15:00:00 via TOPICAL

## 2014-01-27 MED ORDER — FENTANYL CITRATE 0.05 MG/ML IJ SOLN: 100.0000 ug | Freq: Once | INTRAMUSCULAR | Status: DC

## 2014-01-27 MED ORDER — TAMSULOSIN HCL 0.4 MG PO CAPS
0.4000 mg | ORAL_CAPSULE | Freq: Every day | ORAL | Status: DC
  Administered 2014-01-28 – 2014-01-31 (×4): 0.4 mg via ORAL
  Filled 2014-01-27 (×5): qty 1

## 2014-01-27 MED ORDER — INSULIN ASPART 100 UNIT/ML ~~LOC~~ SOLN
0.0000 [IU] | Freq: Three times a day (TID) | SUBCUTANEOUS | Status: DC
  Administered 2014-01-28 – 2014-01-31 (×5): 2 [IU] via SUBCUTANEOUS

## 2014-01-27 MED ORDER — MENTHOL 3 MG MT LOZG: 1.0000 | LOZENGE | OROMUCOSAL | Status: DC | PRN

## 2014-01-27 MED ORDER — ASPIRIN 81 MG PO TABS: 81.0000 mg | ORAL_TABLET | Freq: Every day | ORAL | Status: DC

## 2014-01-27 MED ORDER — MORPHINE SULFATE 2 MG/ML IJ SOLN
1.0000 mg | INTRAMUSCULAR | Status: DC | PRN
  Administered 2014-01-27: 1 mg via INTRAVENOUS
  Administered 2014-01-28 (×2): 2 mg via INTRAVENOUS
  Filled 2014-01-27 (×3): qty 1

## 2014-01-27 MED ORDER — BENAZEPRIL HCL 20 MG PO TABS
20.0000 mg | ORAL_TABLET | Freq: Every day | ORAL | Status: DC
  Administered 2014-01-27 – 2014-01-28 (×2): 20 mg via ORAL
  Filled 2014-01-27 (×2): qty 1

## 2014-01-27 MED ORDER — DIVALPROEX SODIUM ER 500 MG PO TB24
500.0000 mg | ORAL_TABLET | Freq: Every day | ORAL | Status: DC
  Administered 2014-01-28 – 2014-01-31 (×4): 500 mg via ORAL
  Filled 2014-01-27 (×6): qty 1

## 2014-01-27 MED ORDER — THROMBIN 20000 UNITS EX SOLR
CUTANEOUS | Status: AC
  Filled 2014-01-27: qty 20000

## 2014-01-27 MED ORDER — DIVALPROEX SODIUM 250 MG PO DR TAB
250.0000 mg | DELAYED_RELEASE_TABLET | Freq: Every day | ORAL | Status: DC
  Administered 2014-01-27 – 2014-01-31 (×5): 250 mg via ORAL
  Filled 2014-01-27 (×5): qty 1

## 2014-01-27 MED ORDER — CLONAZEPAM 1 MG PO TABS
2.0000 mg | ORAL_TABLET | Freq: Two times a day (BID) | ORAL | Status: DC | PRN
  Administered 2014-01-28: 2 mg via ORAL
  Filled 2014-01-27: qty 2

## 2014-01-27 MED ORDER — POTASSIUM CHLORIDE IN NACL 20-0.45 MEQ/L-% IV SOLN
INTRAVENOUS | Status: DC
  Administered 2014-01-27: 22:00:00 via INTRAVENOUS
  Filled 2014-01-27 (×4): qty 1000

## 2014-01-27 MED ORDER — FENTANYL CITRATE 0.05 MG/ML IJ SOLN
INTRAMUSCULAR | Status: AC
  Filled 2014-01-27: qty 5

## 2014-01-27 MED ORDER — SODIUM CHLORIDE 0.9 % IJ SOLN
3.0000 mL | Freq: Two times a day (BID) | INTRAMUSCULAR | Status: DC
  Administered 2014-01-28: 3 mL via INTRAVENOUS

## 2014-01-27 MED ORDER — MIDAZOLAM HCL 5 MG/5ML IJ SOLN
INTRAMUSCULAR | Status: DC | PRN
  Administered 2014-01-27: 2 mg via INTRAVENOUS

## 2014-01-27 MED ORDER — BUPIVACAINE-EPINEPHRINE (PF) 0.5% -1:200000 IJ SOLN
INTRAMUSCULAR | Status: AC
  Filled 2014-01-27: qty 30

## 2014-01-27 MED ORDER — FENTANYL CITRATE 0.05 MG/ML IJ SOLN
100.0000 ug | Freq: Once | INTRAMUSCULAR | Status: AC
  Administered 2014-01-27: 100 ug via INTRAVENOUS

## 2014-01-27 MED ORDER — PNEUMOCOCCAL VAC POLYVALENT 25 MCG/0.5ML IJ INJ
0.5000 mL | INJECTION | INTRAMUSCULAR | Status: AC
  Administered 2014-01-28: 0.5 mL via INTRAMUSCULAR
  Filled 2014-01-27: qty 0.5

## 2014-01-27 MED ORDER — ARIPIPRAZOLE 5 MG PO TABS
5.0000 mg | ORAL_TABLET | Freq: Every day | ORAL | Status: DC
  Administered 2014-01-27 – 2014-01-31 (×5): 5 mg via ORAL
  Filled 2014-01-27 (×5): qty 1

## 2014-01-27 MED ORDER — VECURONIUM BROMIDE 10 MG IV SOLR
INTRAVENOUS | Status: DC | PRN
  Administered 2014-01-27 (×2): 2 mg via INTRAVENOUS

## 2014-01-27 MED ORDER — BUPIVACAINE HCL (PF) 0.5 % IJ SOLN
INTRAMUSCULAR | Status: DC | PRN
  Administered 2014-01-27: 30 mL

## 2014-01-27 MED ORDER — GABAPENTIN 400 MG PO CAPS
800.0000 mg | ORAL_CAPSULE | Freq: Four times a day (QID) | ORAL | Status: DC
  Administered 2014-01-27 – 2014-01-31 (×14): 800 mg via ORAL
  Filled 2014-01-27 (×18): qty 2

## 2014-01-27 MED ORDER — ONDANSETRON HCL 4 MG/2ML IJ SOLN
INTRAMUSCULAR | Status: AC
  Filled 2014-01-27: qty 2

## 2014-01-27 MED ORDER — DOCUSATE SODIUM 100 MG PO CAPS
100.0000 mg | ORAL_CAPSULE | Freq: Two times a day (BID) | ORAL | Status: DC
  Administered 2014-01-27 – 2014-01-31 (×8): 100 mg via ORAL
  Filled 2014-01-27 (×10): qty 1

## 2014-01-27 MED ORDER — FENTANYL CITRATE 0.05 MG/ML IJ SOLN
50.0000 ug | Freq: Once | INTRAMUSCULAR | Status: AC
  Administered 2014-01-27: 50 ug via INTRAVENOUS

## 2014-01-27 MED ORDER — ROCURONIUM BROMIDE 100 MG/10ML IV SOLN
INTRAVENOUS | Status: DC | PRN
  Administered 2014-01-27: 50 mg via INTRAVENOUS

## 2014-01-27 MED ORDER — PROPOFOL 10 MG/ML IV BOLUS
INTRAVENOUS | Status: DC | PRN
  Administered 2014-01-27: 200 mg via INTRAVENOUS

## 2014-01-27 MED ORDER — PHENOL 1.4 % MT LIQD: 1.0000 | OROMUCOSAL | Status: DC | PRN

## 2014-01-27 MED ORDER — MIDAZOLAM HCL 2 MG/2ML IJ SOLN
INTRAMUSCULAR | Status: AC
  Filled 2014-01-27: qty 2

## 2014-01-27 MED ORDER — NEOSTIGMINE METHYLSULFATE 10 MG/10ML IV SOLN
INTRAVENOUS | Status: DC | PRN
  Administered 2014-01-27: 4 mg via INTRAVENOUS

## 2014-01-27 SURGICAL SUPPLY — 58 items
ADH SKN CLS APL DERMABOND .7 (GAUZE/BANDAGES/DRESSINGS) ×1
AIRSTRIP 3X4 (GAUZE/BANDAGES/DRESSINGS) ×2 IMPLANT
BUR ROUND FLUTED 4 SOFT TCH (BURR) ×1 IMPLANT
CORDS BIPOLAR (ELECTRODE) ×2 IMPLANT
DERMABOND ADVANCED (GAUZE/BANDAGES/DRESSINGS) ×1
DERMABOND ADVANCED .7 DNX12 (GAUZE/BANDAGES/DRESSINGS) ×1 IMPLANT
DRAPE INCISE IOBAN 66X45 STRL (DRAPES) ×1 IMPLANT
DRAPE MICROSCOPE LEICA (MISCELLANEOUS) ×2 IMPLANT
DRAPE POUCH INSTRU U-SHP 10X18 (DRAPES) ×2 IMPLANT
DRAPE PROXIMA HALF (DRAPES) IMPLANT
DRAPE SURG 17X23 STRL (DRAPES) ×8 IMPLANT
DRSG MEPILEX BORDER 4X4 (GAUZE/BANDAGES/DRESSINGS) ×1 IMPLANT
DRSG MEPILEX BORDER 4X8 (GAUZE/BANDAGES/DRESSINGS) ×1 IMPLANT
DURAPREP 26ML APPLICATOR (WOUND CARE) ×2 IMPLANT
ELECT BLADE 4.0 EZ CLEAN MEGAD (MISCELLANEOUS) ×2
ELECT CAUTERY BLADE 6.4 (BLADE) ×2 IMPLANT
ELECT REM PT RETURN 9FT ADLT (ELECTROSURGICAL) ×2
ELECTRODE BLDE 4.0 EZ CLN MEGD (MISCELLANEOUS) IMPLANT
ELECTRODE REM PT RTRN 9FT ADLT (ELECTROSURGICAL) ×1 IMPLANT
EVACUATOR 1/8 PVC DRAIN (DRAIN) ×1 IMPLANT
GLOVE BIOGEL PI IND STRL 7.5 (GLOVE) ×1 IMPLANT
GLOVE BIOGEL PI INDICATOR 7.5 (GLOVE) ×1
GLOVE ECLIPSE 7.0 STRL STRAW (GLOVE) ×2 IMPLANT
GLOVE ECLIPSE 9.0 STRL (GLOVE) ×2 IMPLANT
GLOVE SURG 8.5 LATEX PF (GLOVE) ×2 IMPLANT
GOWN STRL REUS W/ TWL LRG LVL3 (GOWN DISPOSABLE) ×2 IMPLANT
GOWN STRL REUS W/TWL 2XL LVL3 (GOWN DISPOSABLE) ×2 IMPLANT
GOWN STRL REUS W/TWL LRG LVL3 (GOWN DISPOSABLE) ×10
KIT BASIN OR (CUSTOM PROCEDURE TRAY) ×2 IMPLANT
KIT ROOM TURNOVER OR (KITS) ×2 IMPLANT
MANIFOLD NEPTUNE II (INSTRUMENTS) ×1 IMPLANT
MATRIX HEMOSTAT SURGIFLO (HEMOSTASIS) ×1 IMPLANT
NDL SPNL 18GX3.5 QUINCKE PK (NEEDLE) ×2 IMPLANT
NEEDLE 22X1 1/2 (OR ONLY) (NEEDLE) ×2 IMPLANT
NEEDLE SPNL 18GX3.5 QUINCKE PK (NEEDLE) ×4 IMPLANT
NS IRRIG 1000ML POUR BTL (IV SOLUTION) ×2 IMPLANT
PACK LAMINECTOMY ORTHO (CUSTOM PROCEDURE TRAY) ×2 IMPLANT
PAD ARMBOARD 7.5X6 YLW CONV (MISCELLANEOUS) ×5 IMPLANT
PATTIES SURGICAL .5 X.5 (GAUZE/BANDAGES/DRESSINGS) ×1 IMPLANT
PATTIES SURGICAL .75X.75 (GAUZE/BANDAGES/DRESSINGS) IMPLANT
PATTIES SURGICAL 1X1 (DISPOSABLE) ×1 IMPLANT
SPECIMEN JAR SMALL (MISCELLANEOUS) ×2 IMPLANT
SPONGE LAP 4X18 X RAY DECT (DISPOSABLE) ×1 IMPLANT
SPONGE SURGIFOAM ABS GEL 100 (HEMOSTASIS) IMPLANT
SUT VIC AB 0 CT1 27 (SUTURE) ×4
SUT VIC AB 0 CT1 27XBRD ANBCTR (SUTURE) ×2 IMPLANT
SUT VIC AB 1 CT1 27 (SUTURE) ×4
SUT VIC AB 1 CT1 27XBRD ANBCTR (SUTURE) ×2 IMPLANT
SUT VIC AB 2-0 CT1 27 (SUTURE)
SUT VIC AB 2-0 CT1 TAPERPNT 27 (SUTURE) IMPLANT
SUT VICRYL 0 UR6 27IN ABS (SUTURE) IMPLANT
SUT VICRYL 4-0 PS2 18IN ABS (SUTURE) IMPLANT
SYR CONTROL 10ML LL (SYRINGE) ×2 IMPLANT
TOWEL OR 17X24 6PK STRL BLUE (TOWEL DISPOSABLE) ×2 IMPLANT
TOWEL OR 17X26 10 PK STRL BLUE (TOWEL DISPOSABLE) ×2 IMPLANT
TRAY FOLEY CATH 16FRSI W/METER (SET/KITS/TRAYS/PACK) IMPLANT
WATER STERILE IRR 1000ML POUR (IV SOLUTION) ×1 IMPLANT
YANKAUER SUCT BULB TIP NO VENT (SUCTIONS) ×2 IMPLANT

## 2014-01-27 NOTE — Discharge Instructions (Addendum)
No lifting greater than 10 lbs. Avoid bending, stooping and twisting. Walk in house for first week them may start to get out slowly increasing distance up to one mile by 4 weeks post op. Keep incision dry for 5-7 days, may use tegaderm or similar water impervious dressing.

## 2014-01-27 NOTE — Interval H&P Note (Signed)
History and Physical Interval Note:  01/27/2014 1:45 PM  Travis [name]  has presented today for surgery, with the diagnosis of central spinal stenosis L3-4, L4-5  The various methods of treatment have been discussed with the patient and family. After consideration of risks, benefits and other options for treatment, the patient has consented to  Procedure(s): CENTRAL LUMBAR LAMINECTOMY L4-5 AND L3-4 (N/A) as a surgical intervention .  The patient's history has been reviewed, patient examined, no change in status, stable for surgery.  I have reviewed the patient's chart and labs.  Questions were answered to the patient's satisfaction.     NITKA,JAMES E

## 2014-01-27 NOTE — Anesthesia Postprocedure Evaluation (Signed)
  Anesthesia Post-op Note  Patient: Travis [name]  Procedure(s) Performed: Procedure(s): CENTRAL LUMBAR LAMINECTOMY L4-5 AND L3-4 (N/A)  Patient Location: PACU  Anesthesia Type:General  Level of Consciousness: awake, alert  and oriented  Airway and Oxygen Therapy: Patient Spontanous Breathing and Patient connected to nasal cannula oxygen  Post-op Pain: mild  Post-op Assessment: Post-op Vital signs reviewed, Patient's Cardiovascular Status Stable, Respiratory Function Stable, Patent Airway and Pain level controlled  Post-op Vital Signs: stable  Last Vitals:  Filed Vitals:   01/27/14 1835  BP: 116/53  Pulse: 75  Temp: 36.5 C  Resp: 18    Complications: No apparent anesthesia complications

## 2014-01-27 NOTE — Brief Op Note (Addendum)
01/27/2014  4:54 PM  PATIENT:  Travis [name]  63 y.o. male  PRE-OPERATIVE DIAGNOSIS:  central spinal stenosis L3-4, L4-5  POST-OPERATIVE DIAGNOSIS:  central spinal stenosis L3-4, L4-5  PROCEDURE:  Procedure(s): CENTRAL LUMBAR LAMINECTOMY L4-5 AND L3-4 (N/A)  SURGEON:  Surgeon(s) and Role:    * Jessy Oto, MD - Primary  PHYSICIAN ASSISTANT: Myriam Forehand  ANESTHESIA:   general and supplemental local anesthesia 1/2%marcaine 1:1 exparel 1.3% total 30cc, Dr. Ola Spurr.  EBL:  Total I/O In: 1000 [I.V.:1000] Out: 885 [Urine:285; Blood:600]  BLOOD ADMINISTERED:none  DRAINS: (One ) Hemovact drain(s) in the right lumbar area with  Suction Open and Urinary Catheter (Foley)   LOCAL MEDICATIONS USED:  Amount: 30 ml marcaine 1/2% 1:1 exparel 1.3%  SPECIMEN:  No Specimen  DISPOSITION OF SPECIMEN:  N/A  COUNTS:  YES  TOURNIQUET:  * No tourniquets in log *  DICTATION: .Dragon Dictation  PLAN OF CARE: Admit to inpatient   PATIENT DISPOSITION:  PACU - hemodynamically stable.   Delay start of Pharmacological VTE agent (>24hrs) due to surgical blood loss or risk of bleeding: yes

## 2014-01-27 NOTE — Transfer of Care (Signed)
Immediate Anesthesia Transfer of Care Note  Patient: Travis [name]  Procedure(s) Performed: Procedure(s): CENTRAL LUMBAR LAMINECTOMY L4-5 AND L3-4 (N/A)  Patient Location: PACU  Anesthesia Type:General  Level of Consciousness: awake, alert  and oriented  Airway & Oxygen Therapy: Patient Spontanous Breathing  Post-op Assessment: Report given to PACU RN  Post vital signs: Reviewed and stable  Complications: No apparent anesthesia complications

## 2014-01-27 NOTE — Anesthesia Preprocedure Evaluation (Addendum)
Anesthesia Evaluation  Patient identified by MRN, date of birth, ID band Patient awake    Reviewed: Allergy & Precautions, H&P , NPO status , Patient's Chart, lab work & pertinent test results  Airway Mallampati: III TM Distance: >3 FB Neck ROM: Full    Dental  (+) Teeth Intact   Pulmonary sleep apnea , former smoker,  breath sounds clear to auscultation        Cardiovascular hypertension, Pt. on medications + CAD, + Past MI, + Cardiac Stents and + Peripheral Vascular Disease Rhythm:Regular Rate:Normal     Neuro/Psych  Headaches, PSYCHIATRIC DISORDERS Anxiety Depression    GI/Hepatic negative GI ROS, Neg liver ROS,   Endo/Other  diabetes, Type 2, Oral Hypoglycemic AgentsMorbid obesity  Renal/GU negative Renal ROS     Musculoskeletal  (+) Arthritis -, Fibromyalgia -  Abdominal   Peds  Hematology  (+) anemia ,   Anesthesia Other Findings   Reproductive/Obstetrics                          Anesthesia Physical Anesthesia Plan  ASA: III  Anesthesia Plan: General   Post-op Pain Management:    Induction: Intravenous  Airway Management Planned: Oral ETT  Additional Equipment: None  Intra-op Plan:   Post-operative Plan: Extubation in OR  Informed Consent: I have reviewed the patients History and Physical, chart, labs and discussed the procedure including the risks, benefits and alternatives for the proposed anesthesia with the patient or authorized representative who has indicated his/her understanding and acceptance.   Dental advisory given  Plan Discussed with: CRNA, Anesthesiologist and Surgeon  Anesthesia Plan Comments:        Anesthesia Quick Evaluation

## 2014-01-27 NOTE — Op Note (Addendum)
01/27/2014  4:59 PM  PATIENT:  Travis [name]  63 y.o. male  MRN: [id number]  OPERATIVE REPORT  PRE-OPERATIVE DIAGNOSIS:  central spinal stenosis L3-4, L4-5  POST-OPERATIVE DIAGNOSIS:  central spinal stenosis L3-4, L4-5  PROCEDURE:  Procedure(s): CENTRAL LUMBAR LAMINECTOMY L4-5 AND L3-4    SURGEON:  Jessy Oto, MD     ASSISTANT:  Phillips Hay, PA-C  (Present throughout the entire procedure and necessary for completion of procedure in a timely manner)     ANESTHESIA:  General, and supplemental local anesthesia 1/2%marcaine 1:1 exparel 1.3% total 30cc, Dr. Ola Spurr.    COMPLICATIONS:  None.   EBL: 600cc  DRAINS: Foley to SD, Hemovac to Suction reservoir.  FINDINGS: Severe central stenosis L4-5, mid central stenosis L3-4. Bilateral foramenal stenosis L4 and L5.    PROCEDURE: The patient was met in the holding area, and the appropriateL3-4 and L4-5 levels identified and marked with an "X" and my initials. The patient was then transported to OR and was placed under general anesthesia without difficulty. Then placed on the operative table in a prone position. [facility] spine OR table was used. The patient received appropriate preoperative antibiotic prophylaxis ancef 3 gm. Nursing staff inserted a Foley catheter under sterile conditions prior to turning. Standard prep with DuraPrep solution from the mid dorsal spine to the mid sacral level.Time-out procedure was called and correct .  Patient was draped in the usual manner. Iodine Vi-Drape was used. 2 x 18-gauge spinal needles were placed at the expected and of the expected levels. Intraoperative lateral radiograph demonstrated the upper needle at the L2-3 level and the lower needle placed posterior to the L3-4 level. Initial incision was made at the upper needle L3 and the incision carried to the expected level of L4-5 approximately 2 1/2 - 3 inches in length. The incision were infiltrated with supplemental local anesthesia  1/2%marcaine 1:1 exparel 1.3% total 10cc, Dr. Ola Spurr.. Total of 30 cc were used. Skin subcutaneous layers divide down to the lumbodorsal fascia this was incised in both L3 spinous process and lumbosacral spinous process and clamps placed at the interspinous process space at L2-3 and L3-4. A lateral radiograph demonstrated the upper clamp at the L2-3 interspinous space. Lower clamp was at the L3-4 interspinous space. The L3 to L5 levels were then exposed using Cobb elevators and electrocautery. These areas were then packed. Boss McCollough retractor was inserted at the incision site. Leksell rongeur used to remove 50% of the inferior aspect of the spinous process of L3 and the spinous processes of  L4. Leksell rongeur was then used to further remove bone down to the ligamentum flavum and the central portions of the L4  and upper half of L5 lamina and base of the spinous processes were thinned. This facets were then exposed out laterally and were hypertrophic.Osteotomes then used medial aspect of the L3-4 facet and L4-5 facet bilaterally resecting approximately 10-15% of the facet bilaterally. Kerrisons were then used to resect central portions of the lamina of L4 and L3. Ligamentum flavum at the L3-4 and L4-5 levels were then carefully resected centrally preserving at least 7-8 mm with at the pars level L4 and L3. The central laminectomy was further widened performing more resection of the medial aspect of facet at both L4-5 and L3-4. Note that loupe magnification and headlight was used for this portion procedure.the central portions of the ligamentum flavum at the L4-5 level were resected and the medial aspect of the L4-5 facet resected over about 5-10%.  Hypertrophic flava was found to be present. The operating room microscope sterilely draped brought into the field and then carefully the right side decompressed at each level beginning at the right L5 neuroforamen resect bone over the superior lamina of L5 and  decompressing the right L5 foramen and reflected portion ligamentum flavum off the medial aspect of the right L4-5 facet. Hockey-stick nerve probe could be passed out both the L4 and L5 neuroforamen. The medial aspect of facet at the L4-5 level was then carefully evaluated and hypertrophic ligamentum flavum resected using Kerrisons decompressing the lateral recess on this right side. This was done such that hockey-stick nerve probe could be used to pass the lateral recess demonstrating patency and decompression of the L4 nerve root. Attention then turned to the L3-4 were further debridement of the lateral recess and hypertrophic ligamentum flavum and reflected portions of ligamentum flavum were carried out. Changing sides of the OR table in decompression was carried out in similar fashion on the left side from the left L5 nerve root to the L3-4 level. At the left L4-5 level severe lateral recess stenosis was noted lateral recess was decompressed and the L5 nerve root freed up . Under the OR microscope Derricho retractor used to retract the thecal sac on the left side and the left L4-5 lateral recess exposed over the posterior aspect of the L4-5 disc. Further decompression was then carried out the left side of the spinal canal decompressing the L 4, the L3 and the L5 nerve roots. Following this then a hockey-stick nerve probe could be passed out easily bilateral L3 and L4 and 5 neuroforamen. Following decompression bilaterally thrombin-soaked Gelfoam was applied to the laminotomy defects and these areas packed with small sponges. Adequate hemostasis was obtained at all levels. The thrombin-soaked Gelfoam was then removed  surgiflow was used and left in place to provide hemostasis and a medium Hemovac drain placed in the depths the incision the tip of the drain above the L3 lamina on the right side. Exiting on the right lower lumbar spine. Boss McCollough retractor was then removed. The end of the case a approximately  15 mm the central laminotomy was present with normal pulsation of the thecal sac present. Following additional irrigation and with no active bleeding present at any level level, the incision was then closed by first approximating his lumbar muscles the midline with interrupted #1 Vicryl sutures loosely the lumbodorsal fascia then approximated with interrupted #1 Vicryl sutures. Deep subcutaneous layers approximated with interrupted #1-0 Vicryl suture more superficial layers with interrupted 2-0 Vicryl suture the skin closed with a running subcutaneous stitch of 4-0 Vicryl at both levels. All Mepilex bandage is applied to both sites. All instrument and sponge counts were correct. Patient was then reactivated returned to supine position and extubated. He was then returned to recovery room in satisfactory condition.   Phillips Hay PA-C for the duties of assistant surgeon throughout this case she assisted loupe magnification retracting delicate neural structures suctioning over the counter neural structures throughout the cane bilaterally at the lower bar segment and superiorly. Is present from the beginning of the case to the end of the case. Assisted in positioning the patient having in positioning the arm and legs. She also participated in removal the patient from the operating table.            NITKA,JAMES E  01/27/2014, 4:59 PM

## 2014-01-28 ENCOUNTER — Encounter (HOSPITAL_COMMUNITY): Payer: Self-pay | Admitting: General Practice

## 2014-01-28 DIAGNOSIS — M48061 Spinal stenosis, lumbar region without neurogenic claudication: Secondary | ICD-10-CM | POA: Diagnosis not present

## 2014-01-28 DIAGNOSIS — E782 Mixed hyperlipidemia: Secondary | ICD-10-CM | POA: Diagnosis not present

## 2014-01-28 DIAGNOSIS — M48062 Spinal stenosis, lumbar region with neurogenic claudication: Secondary | ICD-10-CM

## 2014-01-28 DIAGNOSIS — G894 Chronic pain syndrome: Secondary | ICD-10-CM | POA: Diagnosis not present

## 2014-01-28 DIAGNOSIS — I10 Essential (primary) hypertension: Secondary | ICD-10-CM | POA: Diagnosis not present

## 2014-01-28 LAB — BASIC METABOLIC PANEL
ANION GAP: 11 (ref 5–15)
BUN: 29 mg/dL — AB (ref 6–23)
CALCIUM: 8.8 mg/dL (ref 8.4–10.5)
CO2: 27 mEq/L (ref 19–32)
CREATININE: 1 mg/dL (ref 0.50–1.35)
Chloride: 103 mEq/L (ref 96–112)
GFR calc Af Amer: >90 mL/min (ref >90)
GFR calc non Af Amer: 79 mL/min — ABNORMAL LOW (ref >90)
Glucose, Bld: 126 mg/dL — ABNORMAL HIGH (ref 70–99)
Potassium: 5 mEq/L (ref 3.7–5.3)
Sodium: 141 mEq/L (ref 137–147)

## 2014-01-28 LAB — GLUCOSE, CAPILLARY
GLUCOSE-CAPILLARY: 127 mg/dL — AB (ref 70–99)
GLUCOSE-CAPILLARY: 94 mg/dL (ref 70–99)
Glucose-Capillary: 105 mg/dL — ABNORMAL HIGH (ref 70–99)
Glucose-Capillary: 109 mg/dL — ABNORMAL HIGH (ref 70–99)
Glucose-Capillary: 118 mg/dL — ABNORMAL HIGH (ref 70–99)

## 2014-01-28 LAB — CBC
HCT: 32.3 % — ABNORMAL LOW (ref 39.0–52.0)
HCT: 29.8 % — ABNORMAL LOW (ref 39.0–52.0)
HEMOGLOBIN: 10.3 g/dL — AB (ref 13.0–17.0)
Hemoglobin: 10.8 g/dL — ABNORMAL LOW (ref 13.0–17.0)
MCH: 30.3 pg (ref 26.0–34.0)
MCH: 31.2 pg (ref 26.0–34.0)
MCHC: 33.4 g/dL (ref 30.0–36.0)
MCHC: 34.6 g/dL (ref 30.0–36.0)
MCV: 90.7 fL (ref 78.0–100.0)
MCV: 90.3 fL (ref 78.0–100.0)
PLATELETS: 198 10*3/uL (ref 150–400)
PLATELETS: 176 10*3/uL (ref 150–400)
RBC: 3.56 MIL/uL — AB (ref 4.22–5.81)
RBC: 3.3 MIL/uL — ABNORMAL LOW (ref 4.22–5.81)
RDW: 12.5 % (ref 11.5–15.5)
RDW: 12.4 % (ref 11.5–15.5)
WBC: 5.7 10*3/uL (ref 4.0–10.5)
WBC: 6 10*3/uL (ref 4.0–10.5)

## 2014-01-28 LAB — TROPONIN I: Troponin I: <0.3 ng/mL (ref <0.30)

## 2014-01-28 LAB — PRO B NATRIURETIC PEPTIDE: Pro B Natriuretic peptide (BNP): 22.1 pg/mL (ref 0–125)

## 2014-01-28 MED ORDER — OXYCODONE HCL 5 MG PO TABS
20.0000 mg | ORAL_TABLET | ORAL | Status: DC | PRN
  Administered 2014-01-29: 20 mg via ORAL
  Filled 2014-01-28: qty 4

## 2014-01-28 MED ORDER — SODIUM CHLORIDE 0.9 % IV BOLUS (SEPSIS)
1000.0000 mL | Freq: Once | INTRAVENOUS | Status: AC
  Administered 2014-01-28: 1000 mL via INTRAVENOUS

## 2014-01-28 MED ORDER — SODIUM CHLORIDE 0.9 % IV BOLUS (SEPSIS): 500.0000 mL | Freq: Once | INTRAVENOUS | Status: DC

## 2014-01-28 MED ORDER — OXYCODONE HCL 15 MG PO TABS: ORAL_TABLET | ORAL | Status: DC

## 2014-01-28 MED ORDER — PANTOPRAZOLE SODIUM 40 MG PO TBEC
40.0000 mg | DELAYED_RELEASE_TABLET | Freq: Every day | ORAL | Status: DC
  Administered 2014-01-28 – 2014-01-31 (×4): 40 mg via ORAL
  Filled 2014-01-28 (×4): qty 1

## 2014-01-28 MED ORDER — SODIUM CHLORIDE 0.9 % IV BOLUS (SEPSIS)
500.0000 mL | Freq: Once | INTRAVENOUS | Status: AC
  Administered 2014-01-28: 500 mL via INTRAVENOUS

## 2014-01-28 NOTE — Evaluation (Signed)
Physical Therapy Evaluation Patient Details Name: Travis [name] MRN: [id number] DOB: 1951/04/09 Today's Date: 01/28/2014   History of Present Illness  s/p laminectomy L3-4 and L4-5  Clinical Impression  Pt underwent above noted procedure 01-27-14.  He is min guard assist for all mobility, including ascend/descend stairs using rails and ambulation with RW.  He will have needed level of assist at home.  Pt educated on back precautions.  All other PT needs can be provided by HHPT upon d/c home.  Pt has all needed DME.    Follow Up Recommendations Home health PT;Supervision for mobility/OOB    Equipment Recommendations  None recommended by PT    Recommendations for Other Services       Precautions / Restrictions Precautions Precautions: Back Precaution Booklet Issued: Yes (comment) Precaution Comments: Educated pt on 3/3 back precautions      Mobility  Bed Mobility Overal bed mobility: Needs Assistance Bed Mobility: Sidelying to Sit   Sidelying to sit: Min assist       General bed mobility comments: min assist for logroll instruction  Transfers Overall transfer level: Needs assistance Equipment used: Rolling walker (2 wheeled) Transfers: Sit to/from [facility] Sit to Stand: Min guard Stand pivot transfers: Min guard       General transfer comment: verbal cues for hand placement/safety  Ambulation/Gait Ambulation/Gait assistance: Min guard Ambulation Distance (Feet): 50 Feet Assistive device: Rolling walker (2 wheeled) Gait Pattern/deviations: Decreased stride length;Antalgic;Step-through pattern   Gait velocity interpretation: Below normal speed for age/gender    Stairs Stairs: Yes Stairs assistance: Min guard Stair Management: Two rails;Step to pattern;Forwards Number of Stairs: 5    Wheelchair Mobility    Modified Rankin (Stroke Patients Only)       Balance                                              Pertinent Vitals/Pain Pain Assessment: 0-10 Pain Score: 9  Pain Intervention(s): Premedicated before session;Monitored during session    [place] expects to be discharged to:: Private residence Living Arrangements: Other relatives   Type of Home: Mobile home Home Access: Stairs to enter Entrance Stairs-Rails: Right;Left;Can reach both Entrance Stairs-Number of Steps: 4 Home Layout: One level Home Equipment: Environmental consultant - 2 wheels;Cane - single point      Prior Function Level of Independence: Independent with assistive device(s)               Hand Dominance   Dominant Hand: Right    Extremity/Trunk Assessment                         Communication   Communication: No difficulties  Cognition Arousal/Alertness: Awake/alert Behavior During Therapy: WFL for tasks assessed/performed Overall Cognitive Status: Within Functional Limits for tasks assessed                      General Comments      Exercises        Assessment/Plan    PT Assessment All further PT needs can be met in the next venue of care  PT Diagnosis Difficulty walking;Acute pain   PT Problem List Decreased activity tolerance;Decreased mobility;Pain;Decreased knowledge of precautions  PT Treatment Interventions     PT Goals (Current goals can be found in the Care Plan section) Acute Rehab PT Goals  PT Goal Formulation: No goals set, d/c therapy    Frequency     Barriers to discharge        Co-evaluation               End of Session Equipment Utilized During Treatment: Gait belt Activity Tolerance: Patient tolerated treatment well Patient left: in chair;with call bell/phone within reach Nurse Communication: Mobility status    Functional Assessment Tool Used: clinical judgement Functional Limitation: Mobility: Walking and moving around Mobility: Walking and Moving Around Current Status ([id number]): At least 1 percent but less than 20 percent  impaired, limited or restricted Mobility: Walking and Moving Around Goal Status [phone]): At least 1 percent but less than 20 percent impaired, limited or restricted Mobility: Walking and Moving Around Discharge Status [phone]): At least 1 percent but less than 20 percent impaired, limited or restricted    Time: 0958-1016 PT Time Calculation (min): 18 min   Charges:   PT Evaluation $Initial PT Evaluation Tier I: 1 Procedure PT Treatments $Gait Training: 8-22 mins   PT G Codes:   Functional Assessment Tool Used: clinical judgement Functional Limitation: Mobility: Walking and moving around    Lorriane Shire 01/28/2014, 10:35 AM

## 2014-01-28 NOTE — Progress Notes (Signed)
Pts BP per NT is 77/48, pts manual BP checked by RN is 78/48. MD notified. Received orders for a one time bolus dose of 1,000 mL at a rate of 500 mL/hr. Nursing will continue to monitor.

## 2014-01-28 NOTE — Consult Note (Signed)
[facility] Medical Consultation  Manoah Deckard TDD:[id number] DOB: 1951-04-28 DOA: 01/27/2014 PCP: [name], MD   Requesting physician: Dr. Louanne Skye  Date of consultation: 01/28/2014 Reason for consultation: hypotension   Impression/Recommendations Principal Problem:   Spinal stenosis, lumbar region, with neurogenic claudication - status post central lumbar laminectomy L4-5, L3-4 - pt still requiring pain medications but says he feels better overall - management per primary team  Active Problems:   Hypotension - possibly related to the effect of sedating medications, benzo's and narcotics and acute blood loss anemia, HTN meds - pt currently receiving IVF and is clinically stable, no chest pain, no dizziness, no dyspnea - will continue to monitor vitals for now - pt on Norvasc, Imdur, Lisinopril, will hold all until BP stabilizes and possibly resume home antihypertensive regimen in AM    Acute blood loss anemia  - post op related likely - no signs of active bleeding - repeat CBC in AM   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 Please contact me if I can be of assistance in the meanwhile. Thank you for this consultation.  Chief Complaint: hypotension noted per attending   HPI:  Pt is 63 yo male who presented to [facility] for surgery as noted above and [facility] consulted for hypotension prior to planned d/c. Pt currently reports feeling well, denies chest pain or shortness of breath, no abd or urinary concerns, no headaches, no visual changes, no dizziness.   Review of Systems:  Constitutional: Negative for fever, chills, diaphoresis, activity change, appetite change and fatigue.  HENT: Negative for ear pain, nosebleeds, congestion, facial swelling, rhinorrhea, neck pain, neck stiffness and ear discharge.   Eyes: Negative for pain, discharge, redness, itching and visual disturbance.   Respiratory: Negative for cough, choking, chest tightness, shortness of breath, wheezing and stridor.   Cardiovascular: Negative for chest pain, palpitations and leg swelling.  Gastrointestinal: Negative for abdominal distention.  Genitourinary: Negative for dysuria, urgency, frequency, hematuria, flank pain, decreased urine volume, difficulty urinating and dyspareunia.  Musculoskeletal: Negative for back pain, joint swelling, arthralgias and gait problem.  Neurological: Negative for dizziness, tremors, seizures, syncope, facial asymmetry, speech difficulty, weakness, light-headedness, numbness and headaches.  Hematological: Negative for adenopathy. Does not bruise/bleed easily.  Psychiatric/Behavioral: Negative for hallucinations, behavioral problems, confusion, dysphoric mood, decreased concentration and agitation.     Past Medical History  Diagnosis Date  . Impaired glucose tolerance 09/27/2010  . HYPERLIPIDEMIA-MIXED 07/15/2009  . Chronic pain syndrome 10/27/2009  . HYPERTENSION 06/24/2009  . CORONARY ARTERY DISEASE 06/24/2009    a. s/p multiple PCIs - In 2008 he had a Taxus DES to the mild LAD, Endeavor DES to mid LCX and distal LCX. In January 2009 he had DES to distal LCX, mid LCX and proximal LCX. In November 2009 had BMS x 2 to the mid RCA. Cath 10/2011 with patent stents, noncardiac CP. LHC 01/2013: patent stents (noncardiac CP).  [name] URETHRAL STRICTURE 06/24/2009  . SHOULDER PAIN, BILATERAL 06/24/2009  . ANKLE PAIN, RIGHT 06/24/2009  . SCIATICA, LEFT 04/19/2010  . NEPHROLITHIASIS, HX OF 06/24/2009  . ERECTILE DYSFUNCTION, ORGANIC 05/30/2010  . Obesity   . Allergy to clopidogrel   . ALLERGIC RHINITIS 06/24/2009  . Anxiety   . Depression     Prior suicide attempt  . Irregular heart beat   . Pneumonia   . Self-catheterizes urinary bladder   . Anginal pain   . Myocardial  infarction 1998  . OSA (obstructive sleep apnea)     not using CPAP (09/30/2013)  . Type II diabetes mellitus   . Hepatitis C  dx'd 1979  . DEGENERATIVE JOINT DISEASE 06/24/2009  . [place] DISEASE, LUMBAR 04/19/2010  . Arthritis     "all my joints" (09/30/2013)  . Fibromyalgia     "left leg" (09/30/2013)  . Chronic back pain     "my whole back" (09/30/2013)  . Kidney stones     "passed on my own"   Past Surgical History  Procedure Laterality Date  . Shoulder open rotator cuff repair Right X 2  . Total knee arthroplasty Bilateral 2008  . Carpal tunnel release Bilateral   . Urethral dilation  X 4  . Knee arthroscopy Right X 7  . Umbilical hernia repair      UHR  . Joint replacement Bilateral   . Hernia repair      umbilical  . Shoulder arthroscopy w/ rotator cuff repair Bilateral     "2 on the left; 1 on the right"  . Tonsillectomy    . Knee cartilage surgery Right X 4    "open; not scopes"  . Knee cartilage surgery Left X 3    "open; not scopes"  . Coronary angioplasty with stent placement      "I have 9 stents"  . Cardiac catheterization  X 1  . Anterior cervical decomp/discectomy fusion N/A 09/26/2013    Procedure: ANTERIOR CERVICAL DISCECTOMY FUSION C3-4, plate and screw fixation, allograft bone graft;  Surgeon: Jessy Oto, MD;  Location: [facility];  Service: Orthopedics;  Laterality: N/A;  . Laminectomy  01/28/2014    L3 L4 L5   Social History:  reports that he quit smoking about 3 years ago. His smoking use included Cigars. He has never used smokeless tobacco. He reports that he drinks alcohol. He reports that he does not use illicit drugs.  No Known Allergies Family History  Problem Relation Age of Onset  . Depression Mother   . Heart disease Mother   . Hypertension Mother   . Cancer Mother     Breast  . Diabetes Father   . Heart disease Father     CABG  . Cancer Father     prostate and skin cancer  . Hypertension Father   . Hyperlipidemia Father   . Depression Brother     x 2  . Hypertension Brother     x2  . Coronary artery disease Other   . Hypertension Other   . Depression Other    . Heart disease Maternal Grandfather   . Early death Maternal Grandfather 30    heart attack  . Early death Paternal Grandfather     Prior to Admission medications   Medication Sig Start Date End Date Taking? Authorizing Provider  amLODipine (NORVASC) 5 MG tablet Take 5 mg by mouth daily.   Yes Historical Provider, MD  ARIPiprazole (ABILIFY) 5 MG tablet Take 5 mg by mouth daily. 01/13/14  Yes Kathlee Nations, MD  aspirin 81 MG tablet Take 1 tablet (81 mg total) by mouth daily. 06/28/12  Yes Waylan Boga, NP  atorvastatin (LIPITOR) 20 MG tablet Take 20 mg by mouth daily.   Yes Historical Provider, MD  benazepril (LOTENSIN) 20 MG tablet Take 20 mg by mouth daily.   Yes Historical Provider, MD  clonazePAM Bobbye [place]) 2 MG tablet Take one tablet by mouth twice daily as needed for anxiety 10/15/13  Yes [name]  Jenny Reichmann, MD  cyclobenzaprine (FLEXERIL) 5 MG tablet Take 1 tablet (5 mg total) by mouth 3 (three) times daily as needed for muscle spasms. 10/15/13  Yes [name], MD  divalproex (DEPAKOTE) 250 MG DR tablet Take 250-500 mg by mouth 2 (two) times daily. 250 mg in morning and 500 mg at bedtime 10/15/13  Yes [name], MD  FLUoxetine (PROZAC) 40 MG capsule Take 40 mg by mouth daily. 01/13/14  Yes Kathlee Nations, MD  fluticasone (FLONASE) 50 MCG/ACT nasal spray Place 1 spray into both nostrils daily.   Yes Historical Provider, MD  gabapentin (NEURONTIN) 400 MG capsule Take 800 mg by mouth 4 (four) times daily. 06/28/12  Yes Waylan Boga, NP  isosorbide mononitrate (IMDUR) 60 MG 24 hr tablet Take 60 mg by mouth daily.    Yes Historical Provider, MD  metFORMIN (GLUCOPHAGE) 500 MG tablet Take by mouth 2 (two) times daily with a meal.   Yes Historical Provider, MD  Omega-3 Fatty Acids (FISH OIL) 1000 MG CAPS Take 2 capsules by mouth daily.   Yes Historical Provider, MD  oxyCODONE (ROXICODONE) 15 MG immediate release tablet Take 30 mg by mouth every 4 (four) hours as needed for pain. Take one tablet by mouth  every 4 hours as needed for severe pain 10/01/13  Yes Mahima Pandey, MD  prasugrel (EFFIENT) 10 MG TABS tablet Take 10 mg by mouth daily.   Yes Historical Provider, MD  sildenafil (VIAGRA) 50 MG tablet Take 1 tablet (50 mg total) by mouth daily as needed for erectile dysfunction. 09/03/13  Yes Theodis Blaze, MD  tamsulosin [facility]) 0.4 MG CAPS capsule Take 0.4 mg by mouth daily after breakfast. 10/15/13  Yes [name], MD  traZODone (DESYREL) 100 MG tablet Take 100 mg by mouth at bedtime. 01/13/14  Yes Kathlee Nations, MD  oxyCODONE (ROXICODONE) 15 MG immediate release tablet 1-2 tabs q 3-4 hrs prn severe breakthru pain. 01/28/14   Epimenio Foot, PA-C   Physical Exam: Blood pressure 101/53, pulse 84, temperature 99.1 F (37.3 C), temperature source Oral, resp. rate 18, height 6' 1"  (1.854 m), weight 123.378 kg (272 lb), SpO2 96.00%. Filed Vitals:   01/28/14 1801  BP:   Pulse:   Temp: 99.1 F (37.3 C)  Resp:    Physical Exam  Constitutional: Appears well-developed and well-nourished. No distress.  HENT: Normocephalic. External right and left ear normal. Oropharynx is clear and moist.  Eyes: Conjunctivae and EOM are normal. PERRLA, no scleral icterus.  Neck: Normal ROM. Neck supple. No JVD. No tracheal deviation. No thyromegaly.  CVS: RRR, S1/S2 +, no murmurs, no gallops, no carotid bruit.  Pulmonary: Effort and breath sounds normal, no stridor, rhonchi, wheezes, rales.  Abdominal: Soft. BS +,  no distension, tenderness, rebound or guarding.  Musculoskeletal: Normal range of motion. No edema and no tenderness.  Lymphadenopathy: No lymphadenopathy noted, cervical, inguinal. Neuro: Alert. Normal reflexes, muscle tone coordination. No cranial nerve deficit. Skin: Skin is warm and dry. No rash noted. Not diaphoretic. No erythema. No pallor.  Psychiatric: Normal mood and affect. Behavior, judgment, thought content normal.    Labs on Admission:  Basic Metabolic Panel:  Recent Labs Lab  01/28/14 0417  NA 141  K 5.0  CL 103  CO2 27  GLUCOSE 126*  BUN 29*  CREATININE 1.00  CALCIUM 8.8   CBC:  Recent Labs Lab 01/22/14 1125 01/28/14 0417  WBC 4.0 5.7  HGB 12.5* 10.8*  HCT 36.4* 32.3*  MCV 89.7 90.7  PLT 182 198   CBG:  Recent Labs Lab 01/27/14 2136 01/28/14 0622 01/28/14 1138 01/28/14 1348 01/28/14 1612  GLUCAP 177* 105* 127* 109* 118*    Radiological Exams on Admission: Dg Lumbar Spine 2-3 Views  01/27/2014   CLINICAL DATA:  History of degenerative disc disease.  EXAM: LUMBAR SPINE - 2-3 VIEW  COMPARISON:  MRI 09/03/2013  FINDINGS: Three lateral images were submitted. On the first image, there are surgical markers along the posterior aspect of L3-L4 and L4-5. On the last image, the markers are along the posterior aspect of L4 and L4-L5.  IMPRESSION: Surgical marking as described.   Electronically Signed   By: Markus Daft M.D.   On: 01/27/2014 15:14    EKG: Not done   Time spent: 45 minutes   Faye Ramsay [facility] Pager [phone]   If 7PM-7AM, please contact night-coverage [email or web address] Password [email or web address] 01/28/2014, 7:22 PM

## 2014-01-28 NOTE — Progress Notes (Signed)
Patient's shaking has decreased significantly- mild tremors at this time.

## 2014-01-28 NOTE — Plan of Care (Signed)
Problem: Consults Goal: Diagnosis - Spinal Surgery Outcome: Completed/Met Date Met:  01/28/14 Lumbar Laminectomy (Complex) L3-L4, L4-L5

## 2014-01-28 NOTE — Progress Notes (Addendum)
Patient complained of feeling lightheaded, dizzy, difficulty remembering, and jittery. BS WNL. BP low. Lynnae Sandhoff, [place] notified and ordered NS bolus and for patient to stay tonight, rather than be discharged.

## 2014-01-28 NOTE — Progress Notes (Signed)
Called by RN to assist with patient with onset of feeling "not right" and jittery.  RN asked to check current VS and CBG - reports that patient is alert but feels "funny."  On arrival BP 72/42 right arm and 80/476 left arm - both manual BP.  Patient sitting in chair - alert without distress - warm and dry - pale.  On RA - denies CP or SOB - able to push self back in chair - resps reg and unlabored - HR 82 and regular.  Patient had IV MS 2 mg at 1312 - states "he and MS do not get along well."  States he feels funny and dizzy with pain med - review of home meds reveals Oxy IR 30 mg which he takes at home for his chronic back pain.  Med review = had lots of Bp meds this am.  IV patent right hand.  Patient reclined in chair with feet up.  NS bolus started.  Dr. Otho Ket PA Adela Lank alerted and aware.  Orders given.  Patient remains alert without sx - call as needed.

## 2014-01-28 NOTE — Evaluation (Signed)
Occupational Therapy Evaluation Patient Details Name: Travis [name] MRN: [id number] DOB: 1950-06-30 Today's Date: 01/28/2014    History of Present Illness s/p laminectomy L3-4 and L4-5   Clinical Impression   Pt admitted with the above diagnoses and presents with below problem list. Pt will benefit from continued acute OT to address the below listed deficits and maximize independence with basic ADLs prior to d/c home with family. PTA pt was mod I with ADLs. Currently pt is at min guard level with ADLs with use of AE.      Follow Up Recommendations  Supervision/Assistance - 24 hour;No OT follow up    Equipment Recommendations  3 in 1 bedside comode    Recommendations for Other Services       Precautions / Restrictions Precautions Precautions: Back Precaution Booklet Issued: Yes (comment) Precaution Comments: reviewed precautions Required Braces or Orthoses: Sling      Mobility Bed Mobility Overal bed mobility: Needs Assistance Bed Mobility: Sidelying to Sit   Sidelying to sit: Min assist       General bed mobility comments: pt in recliner  Transfers Overall transfer level: Needs assistance Equipment used: Rolling walker (2 wheeled) Transfers: Sit to/from Stand Sit to Stand: Min guard Stand pivot transfers: Min guard       General transfer comment: cues for technique, very slow movement but not physical A needed    Balance Overall balance assessment: Needs assistance Sitting-balance support: No upper extremity supported;Feet supported Sitting balance-Leahy Scale: Good     Standing balance support: Bilateral upper extremity supported;During functional activity Standing balance-Leahy Scale: Fair                              ADL Overall ADL's : Needs assistance/impaired Eating/Feeding: Set up;Sitting   Grooming: Set up;Sitting;Standing   Upper Body Bathing: Min guard;Sitting   Lower Body Bathing: Min guard;With adaptive equipment;Sit  to/from stand   Upper Body Dressing : Set up;Sitting   Lower Body Dressing: Min guard;Sit to/from stand;With adaptive equipment   Toilet Transfer: Min guard;Ambulation;RW;BSC   Toileting- Water quality scientist and Hygiene: Min guard;Sit to/from stand   Tub/ Shower Transfer: Min guard;Ambulation;3 in 1;Rolling walker   Functional mobility during ADLs: Min guard;Rolling walker General ADL Comments: Educated pt on techniques and AE for safe completion of ADLs with back precautions. Pt needing lots of extra time with movement. Educated on functional impact of back precautions on ADLs.     Vision                     Perception     Praxis      Pertinent Vitals/Pain Pain Assessment: 0-10 Pain Score: 6  Pain Location: back Pain Descriptors / Indicators: Aching Pain Intervention(s): Limited activity within patient's tolerance;Monitored during session;Premedicated before session     Hand Dominance Right   Extremity/Trunk Assessment Upper Extremity Assessment Upper Extremity Assessment: Overall WFL for tasks assessed   Lower Extremity Assessment Lower Extremity Assessment: Defer to PT evaluation       Communication Communication Communication: No difficulties   Cognition Arousal/Alertness: Awake/alert Behavior During Therapy: WFL for tasks assessed/performed Overall Cognitive Status: Within Functional Limits for tasks assessed                     General Comments       Exercises       Shoulder Instructions      Home Living Family/patient expects to  be discharged to:: Private residence Living Arrangements: Other relatives;Other (Comment) (brother) Available Help at Discharge: Family Type of Home: Mobile home Home Access: Stairs to enter Entrance Stairs-Number of Steps: 4 Entrance Stairs-Rails: Right;Left;Can reach both Home Layout: One level     Bathroom Shower/Tub: Occupational psychologist: Standard     Home Equipment: Environmental consultant - 2  wheels;Cane - single point;Adaptive equipment Adaptive Equipment: Reacher;Sock aid Additional Comments: plans to live with brother during recovery      Prior Functioning/Environment Level of Independence: Independent with assistive device(s)        Comments: per pt report occasional use of cane    OT Diagnosis: Acute pain   OT Problem List: Impaired balance (sitting and/or standing);Decreased knowledge of precautions;Decreased knowledge of use of DME or AE;Pain   OT Treatment/Interventions: Self-care/ADL training;Therapeutic exercise;DME and/or AE instruction;Therapeutic activities;Patient/family education;Balance training    OT Goals(Current goals can be found in the care plan section) Acute Rehab OT Goals Patient Stated Goal: not stated OT Goal Formulation: With patient Time For Goal Achievement: 02/04/14 Potential to Achieve Goals: Good ADL Goals Pt Will Perform Lower Body Bathing: with modified independence;with adaptive equipment;sit to/from stand Pt Will Perform Lower Body Dressing: with modified independence;with adaptive equipment;sit to/from stand Pt Will Transfer to Toilet: with modified independence;ambulating (3n1 over toilet) Pt Will Perform Toileting - Clothing Manipulation and hygiene: with modified independence;sit to/from stand Pt Will Perform Tub/Shower Transfer: with modified independence;ambulating;3 in 1;rolling walker  OT Frequency: Min 2X/week   Barriers to D/C:            Co-evaluation              End of Session Equipment Utilized During Treatment: Gait belt;Rolling walker  Activity Tolerance: Patient tolerated treatment well;Patient limited by fatigue Patient left: in chair;with call bell/phone within reach   Time: 1118-1140 OT Time Calculation (min): 22 min Charges:  OT General Charges $OT Visit: 1 Procedure OT Evaluation $Initial OT Evaluation Tier I: 1 Procedure OT Treatments $Self Care/Home Management : 8-22 mins G-Codes: OT  G-codes **NOT FOR INPATIENT CLASS** Functional Assessment Tool Used: clinical judgment Functional Limitation: Self care Self Care Current Status (K1828): At least 1 percent but less than 20 percent impaired, limited or restricted Self Care Goal Status (Q3374): At least 1 percent but less than 20 percent impaired, limited or restricted Self Care Discharge Status [phone]): At least 1 percent but less than 20 percent impaired, limited or restricted  Hortencia Pilar 01/28/2014, 11:56 AM

## 2014-01-28 NOTE — Progress Notes (Addendum)
Updated Dr. Louanne Skye regarding patient's condition. Patient's BP has improved and patient is very alert. However, patient's shaking has increased. May be having withdrawals from Klonopin (per Dr. Louanne Skye). Klonopin given and hospitalist consulted.

## 2014-01-28 NOTE — Progress Notes (Signed)
Patient is demonstrating low BP today. Nursing notes that decrease associated with Morphine and Oxycodone Given. Stopped Morphine ER and decreased oxycodone to 20 mg every 3-4 hours prn pain. History of CAD on  effilin and aspirin. Had 600cc blood loss with central laminectomy L3-4 and L4-5. Voiding without difficulty. Feels sleepy. Troponin, BNP and CBC ordered. EKG ordered. IVF restarted. Will request consult with [facility] to eval for other causes of hypotension other than narcotics or blood loss Anemia. R/o  Cardiac cause of hypotension.

## 2014-01-28 NOTE — Progress Notes (Signed)
Dr. Louanne Skye, MD notified that patient's BP is still low (86/39) and that patient is having difficulty staying awake. HR and SaO2 WNL. New order given.

## 2014-01-28 NOTE — Progress Notes (Signed)
Utilization review completed.  

## 2014-01-28 NOTE — Progress Notes (Signed)
Subjective: 1 Day Post-Op Procedure(s) (LRB): CENTRAL LUMBAR LAMINECTOMY L4-5 AND L3-4 (N/A) Patient reports pain as mild.  Sitting in chair this am. Ate breakfast. No nausea or vomiting.  Voiding well.  Objective: Vital signs in last 24 hours: Temp:  [97.5 F (36.4 C)-98 F (36.7 C)] 97.6 F (36.4 C) (09/09 0507) Pulse Rate:  [54-78] 74 (09/09 0507) Resp:  [0-20] 18 (09/09 0507) BP: (96-124)/(50-89) 110/58 mmHg (09/09 0507) SpO2:  [95 %-100 %] 99 % (09/09 0507) Weight:  [123.378 kg (272 lb)] 123.378 kg (272 lb) (09/08 1045)  Intake/Output from previous day: 09/08 0701 - 09/09 0700 In: 2166.3 [I.V.:2166.3] Out: 2085 [Urine:1285; Drains:200; Blood:600] Intake/Output this shift:     Recent Labs  01/28/14 0417  HGB 10.8*    Recent Labs  01/28/14 0417  WBC 5.7  RBC 3.56*  HCT 32.3*  PLT 198    Recent Labs  01/28/14 0417  NA 141  K 5.0  CL 103  CO2 27  BUN 29*  CREATININE 1.00  GLUCOSE 126*  CALCIUM 8.8   No results found for this basename: LABPT, INR,  in the last 72 hours  Neurovascular intact Dorsiflexion/Plantar flexion intact Incision: no drainage from incision.  hemovac drain removed.  scant bloody drainage from drain site.  Assessment/Plan: 1 Day Post-Op Procedure(s) (LRB): CENTRAL LUMBAR LAMINECTOMY L4-5 AND L3-4 (N/A) Up with therapy D/C IV fluids Discharge home with home health Pt lives with family and will be discharged later today if approved by PT.  Phillips Hay M 01/28/2014, 8:19 AM

## 2014-01-28 NOTE — Care Management Note (Signed)
CARE MANAGEMENT NOTE 01/28/2014  Patient:  Travis [name], Travis [name]   Account Number:  [id number]  Date Initiated:  01/28/2014  Documentation initiated by:  Ricki Miller  Subjective/Objective Assessment:   63 yr old male admitted with central spinal stenosis L3-4, L4-5, s/p L3-4, L4-5 Lumbar laminectomy,     Action/Plan:   Case manager spoke with patient concerning home health and DME needs at discharge. Choice offered. Referral called to Rhett Bannister, [place] liaison.  Orders for 3in1 faxed to [place] [phone].   Anticipated DC Date:  01/29/2014   Anticipated DC Plan:  [place]  CM consult      [place]   Choice offered to / List presented to:  C-1 Patient   DME arranged  3-N-1      DME agency  [place] arranged  HH-2 PT      [place]   Status of service:  Completed, signed off Medicare Important Message given?   (If response is "NO", the following Medicare IM given date fields will be blank) Date Medicare IM given:   Medicare IM given by:   Date Additional Medicare IM given:   Additional Medicare IM given by:    Discharge Disposition:  [place]  Per UR Regulation:  Reviewed for med. necessity/level of care/duration of stay

## 2014-01-29 ENCOUNTER — Observation Stay (HOSPITAL_COMMUNITY): Payer: Medicare HMO

## 2014-01-29 ENCOUNTER — Encounter (HOSPITAL_COMMUNITY): Payer: Self-pay | Admitting: Specialist

## 2014-01-29 DIAGNOSIS — M48061 Spinal stenosis, lumbar region without neurogenic claudication: Secondary | ICD-10-CM | POA: Diagnosis not present

## 2014-01-29 DIAGNOSIS — I498 Other specified cardiac arrhythmias: Secondary | ICD-10-CM

## 2014-01-29 LAB — URINALYSIS, ROUTINE W REFLEX MICROSCOPIC
BILIRUBIN URINE: NEGATIVE
Glucose, UA: NEGATIVE mg/dL
HGB URINE DIPSTICK: NEGATIVE
KETONES UR: NEGATIVE mg/dL
NITRITE: NEGATIVE
Protein, ur: NEGATIVE mg/dL
SPECIFIC GRAVITY, URINE: 1.017 (ref 1.005–1.030)
Urobilinogen, UA: 0.2 mg/dL (ref 0.0–1.0)
pH: 5.5 (ref 5.0–8.0)

## 2014-01-29 LAB — BASIC METABOLIC PANEL
Anion gap: 10 (ref 5–15)
BUN: 29 mg/dL — AB (ref 6–23)
CO2: 24 meq/L (ref 19–32)
Calcium: 8.2 mg/dL — ABNORMAL LOW (ref 8.4–10.5)
Chloride: 104 mEq/L (ref 96–112)
Creatinine, Ser: 1.08 mg/dL (ref 0.50–1.35)
GFR calc Af Amer: 84 mL/min — ABNORMAL LOW (ref >90)
GFR, EST NON AFRICAN AMERICAN: 72 mL/min — AB (ref >90)
GLUCOSE: 156 mg/dL — AB (ref 70–99)
Potassium: 5.1 mEq/L (ref 3.7–5.3)
Sodium: 138 mEq/L (ref 137–147)

## 2014-01-29 LAB — CBC
HEMATOCRIT: 25.5 % — AB (ref 39.0–52.0)
HEMOGLOBIN: 8.6 g/dL — AB (ref 13.0–17.0)
MCH: 31 pg (ref 26.0–34.0)
MCHC: 33.7 g/dL (ref 30.0–36.0)
MCV: 92.1 fL (ref 78.0–100.0)
Platelets: 148 10*3/uL — ABNORMAL LOW (ref 150–400)
RBC: 2.77 MIL/uL — AB (ref 4.22–5.81)
RDW: 12.5 % (ref 11.5–15.5)
WBC: 5.6 10*3/uL (ref 4.0–10.5)

## 2014-01-29 LAB — LACTIC ACID, PLASMA: Lactic Acid, Venous: 1.1 mmol/L (ref 0.5–2.2)

## 2014-01-29 LAB — GLUCOSE, CAPILLARY
GLUCOSE-CAPILLARY: 150 mg/dL — AB (ref 70–99)
GLUCOSE-CAPILLARY: 138 mg/dL — AB (ref 70–99)
GLUCOSE-CAPILLARY: 114 mg/dL — AB (ref 70–99)
Glucose-Capillary: 96 mg/dL (ref 70–99)

## 2014-01-29 LAB — URINE MICROSCOPIC-ADD ON

## 2014-01-29 MED ORDER — BISACODYL 10 MG RE SUPP
10.0000 mg | Freq: Once | RECTAL | Status: DC
Start: 2014-01-29 — End: 2014-01-31
  Filled 2014-01-29: qty 1

## 2014-01-29 MED ORDER — CLONAZEPAM 1 MG PO TABS: 1.0000 mg | ORAL_TABLET | Freq: Two times a day (BID) | ORAL | Status: DC | PRN

## 2014-01-29 MED ORDER — SODIUM CHLORIDE 0.9 % IV BOLUS (SEPSIS)
1000.0000 mL | Freq: Once | INTRAVENOUS | Status: AC
  Administered 2014-01-29: 1000 mL via INTRAVENOUS

## 2014-01-29 MED ORDER — TRAZODONE HCL 50 MG PO TABS
50.0000 mg | ORAL_TABLET | Freq: Every day | ORAL | Status: DC
  Administered 2014-01-29 – 2014-01-30 (×2): 50 mg via ORAL
  Filled 2014-01-29 (×4): qty 1

## 2014-01-29 MED ORDER — METOCLOPRAMIDE HCL 10 MG PO TABS: 5.0000 mg | ORAL_TABLET | Freq: Four times a day (QID) | ORAL | Status: DC | PRN

## 2014-01-29 MED ORDER — SODIUM CHLORIDE 0.9 % IV SOLN
INTRAVENOUS | Status: DC
  Administered 2014-01-29 – 2014-01-30 (×2): via INTRAVENOUS

## 2014-01-29 MED ORDER — OXYCODONE HCL 5 MG PO TABS
15.0000 mg | ORAL_TABLET | ORAL | Status: DC | PRN
  Administered 2014-01-29 – 2014-01-30 (×5): 15 mg via ORAL
  Filled 2014-01-29 (×5): qty 3

## 2014-01-29 MED ORDER — ACETAMINOPHEN 500 MG PO TABS
1000.0000 mg | ORAL_TABLET | Freq: Three times a day (TID) | ORAL | Status: DC
  Administered 2014-01-29 – 2014-01-31 (×7): 1000 mg via ORAL
  Filled 2014-01-29 (×11): qty 2

## 2014-01-29 MED ORDER — SENNOSIDES-DOCUSATE SODIUM 8.6-50 MG PO TABS
1.0000 | ORAL_TABLET | Freq: Every day | ORAL | Status: DC
  Administered 2014-01-29 – 2014-01-30 (×2): 1 via ORAL
  Filled 2014-01-29 (×2): qty 1

## 2014-01-29 MED ORDER — POLYETHYLENE GLYCOL 3350 17 G PO PACK
17.0000 g | PACK | Freq: Every day | ORAL | Status: DC
  Administered 2014-01-29 – 2014-01-30 (×2): 17 g via ORAL
  Filled 2014-01-29 (×4): qty 1

## 2014-01-29 NOTE — Progress Notes (Signed)
After second 1,000 mL bolus given, pts BP is 93/41. Current fluid orders restarted. Nursing will continue to monitor.

## 2014-01-29 NOTE — Progress Notes (Signed)
After 1,000 mL given to pt, BP IS 97/49. One hour later @ 0200 BP was rechecked and found to be 84/44. PA on call notified. Orders given to receive another 1,000 mL bolus. Orders carried out, nursing will continue to monitor.

## 2014-01-29 NOTE — Progress Notes (Signed)
Utilization review completed.  

## 2014-01-29 NOTE — Progress Notes (Signed)
Pt c/o not being able to urinate, empty his bladder entirely, and feels slight pain in lower abdomen region. Nursing staff got patient to a standing position to attempt to further empty his bladder, pt still unable to empty bladder. Bladder scan found to be 889 mL. Per MD order, in & out cath performed by NT, 1,050 mL emptied from pts bladder. Pt no longer c/o fullness or discomfort. Nursing will continue to monitor.

## 2014-01-29 NOTE — Evaluation (Signed)
Physical Therapy Evaluation Patient Details Name: Travis [name] MRN: [id number] DOB: [birth year] Today's Date: 01/29/2014   History of Present Illness  s/p laminectomy L3-4 and L4-5  Clinical Impression  Pt with significant decline in mobility as compared to PT eval yesterday morning.  Pt admitting to memory troubles since yesterday and needs re-educating on back precautions and safe techniques.  Pt only able to ambulate 5' this pm and needed a 2nd person to A for safety.  Pt very painful during mobility and indicates no dizziness.  RN made aware.  Will continue to follow pt while on acute.      Follow Up Recommendations Home health PT;Supervision for mobility/OOB    Equipment Recommendations  None recommended by PT    Recommendations for Other Services       Precautions / Restrictions Precautions Precautions: Back Precaution Comments: reviewed precautions Restrictions Weight Bearing Restrictions: No      Mobility  Bed Mobility Overal bed mobility: Needs Assistance;+2 for physical assistance Bed Mobility: Rolling;Sidelying to Sit Rolling: Mod assist;+2 for physical assistance Sidelying to sit: Mod assist;+2 for physical assistance       General bed mobility comments: cues for step by step through technique.  pt very painful.    Transfers Overall transfer level: Needs assistance Equipment used: Rolling walker (2 wheeled) Transfers: Sit to/from Stand Sit to Stand: Min assist;+2 physical assistance         General transfer comment: cues for UE use and positioning LEs.    Ambulation/Gait Ambulation/Gait assistance: Min assist Ambulation Distance (Feet): 5 Feet Assistive device: Rolling walker (2 wheeled) Gait Pattern/deviations: Step-through pattern;Decreased stride length;Decreased stance time - right     General Gait Details: pt moves very slowly and very labored.  pt's R LE maintained in external rotation and pt indicates this is not new.  pt unable to clear R  foot from floor for each step.  pt fatigues very quickly.    Stairs            Wheelchair Mobility    Modified Rankin (Stroke Patients Only)       Balance Overall balance assessment: Needs assistance Sitting-balance support: Bilateral upper extremity supported;Feet supported Sitting balance-Leahy Scale: Poor     Standing balance support: Bilateral upper extremity supported Standing balance-Leahy Scale: Poor                               Pertinent Vitals/Pain Pain Assessment: 0-10 Pain Score: 9  Pain Location: back Pain Descriptors / Indicators: Constant;Throbbing Pain Intervention(s): Monitored during session;Repositioned;Patient requesting pain meds-RN notified    Home Living Family/patient expects to be discharged to:: Private residence Living Arrangements: Other relatives;Other (Comment) (brother) Available Help at Discharge: Family Type of Home: Mobile home Home Access: Stairs to enter Entrance Stairs-Rails: Right;Left;Can reach both Entrance Stairs-Number of Steps: 4 Home Layout: One level Home Equipment: Walker - 2 wheels;Cane - single point;Adaptive equipment Additional Comments: plans to live with brother during recovery    Prior Function Level of Independence: Independent with assistive device(s)         Comments: per pt report occasional use of cane     Hand Dominance   Dominant Hand: Right    Extremity/Trunk Assessment   Upper Extremity Assessment: Defer to OT evaluation           Lower Extremity Assessment: Generalized weakness      Cervical / Trunk Assessment: Normal  Communication   Communication: No  difficulties  Cognition Arousal/Alertness: Awake/alert Behavior During Therapy: WFL for tasks assessed/performed Overall Cognitive Status: Impaired/Different from baseline Area of Impairment: Memory;Orientation Orientation Level: Disoriented to;Time   Memory: Decreased short-term memory         General  Comments: Suspect cognitive deficits are due to pain meds.      General Comments      Exercises        Assessment/Plan    PT Assessment Patient needs continued PT services  PT Diagnosis Difficulty walking;Acute pain   PT Problem List Decreased strength;Decreased activity tolerance;Decreased balance;Decreased mobility;Decreased knowledge of use of DME;Decreased cognition;Pain  PT Treatment Interventions DME instruction;Gait training;Stair training;Functional mobility training;Therapeutic activities;Therapeutic exercise;Balance training;Patient/family education   PT Goals (Current goals can be found in the Care Plan section) Acute Rehab PT Goals Patient Stated Goal: not stated PT Goal Formulation: With patient Time For Goal Achievement: 02/05/14 Potential to Achieve Goals: Good    Frequency Min 5X/week   Barriers to discharge        Co-evaluation               End of Session Equipment Utilized During Treatment: Gait belt Activity Tolerance: Patient limited by fatigue;Patient limited by pain Patient left: in chair;with call bell/phone within reach Nurse Communication: Mobility status;Patient requests pain meds    Functional Assessment Tool Used: clinical judgement Functional Limitation: Mobility: Walking and moving around Mobility: Walking and Moving Around Current Status [phone]): At least 40 percent but less than 60 percent impaired, limited or restricted Mobility: Walking and Moving Around Goal Status [phone]): At least 1 percent but less than 20 percent impaired, limited or restricted    Time: 1421-1455 PT Time Calculation (min): 34 min   Charges:   PT Evaluation $PT Re-evaluation: 1 Procedure PT Treatments $Gait Training: 8-22 mins   PT G Codes:   Functional Assessment Tool Used: clinical judgement Functional Limitation: Mobility: Walking and moving around    Catarina Hartshorn, [place] [phone] 01/29/2014, 3:41 PM

## 2014-01-29 NOTE — Progress Notes (Signed)
Patient ID: Travis [name]  male  WKM:[id number]    DOB: Dec 18, 1950    DOA: 01/27/2014  PCP: [name], MD  Assessment/Plan:  Principal Problem:  Spinal stenosis, lumbar region, with neurogenic claudication  - status post central lumbar laminectomy L4-5, L3-4  - feels very stiff today, placed on scheduled tylenol, continue robaxin and oxycodone per primary team   Active Problems:  Hypotension, low grade fever; BP still soft   - possibly related to the effect of sedating medications, benzo's and narcotics and acute blood loss anemia, HTN meds  - pt on Norvasc, Imdur, Lisinopril, will hold all until BP stabilizes, cont gentle hydration - given low grade temp,obtain lactic acid, UA and culture, CXR, no antibiotics until source clear  Acute blood loss anemia - post op related likely  - no signs of active bleeding, Hb 8.6, likely some component of hemodilution, but down from 10.3, will leave to primary team if 1 unit of transfusion.   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  DVT Prophylaxis: SCD's  Code Status:  Family Communication:  Disposition: I ordered PT eval again   Antibiotics:  None    Subjective: BP still soft, feels very weak and stiff, had received 5m oxycodone at 5:40am. No BM in 3 days  Objective: Weight change:   Intake/Output Summary (Last 24 hours) at 01/29/14 0740 Last data filed at 01/29/14 0700  Gross per 24 hour  Intake 2627.5 ml  Output   1053 ml  Net 1574.5 ml   Blood pressure 102/44, pulse 80, temperature 99.1 F (37.3 C), temperature source Oral, resp. rate 18, height 6' 1"  (1.854 m), weight 123.378 kg (272 lb), SpO2 95.00%.  Physical Exam: General: somewhat lethargic but responds appropriately CVS: S1-S2 clear, no murmur rubs or gallops Chest: clear to auscultation bilaterally, no wheezing,  rales or rhonchi Abdomen: soft nontender, nondistended, normal bowel sounds  Extremities: no cyanosis, clubbing or edema noted bilaterally Neuro: Cranial nerves II-XII intact, no focal neurological deficits  Lab Results: Basic Metabolic Panel:  Recent Labs Lab 01/28/14 0417  NA 141  K 5.0  CL 103  CO2 27  GLUCOSE 126*  BUN 29*  CREATININE 1.00  CALCIUM 8.8   Liver Function Tests: No results found for this basename: AST, ALT, ALKPHOS, BILITOT, PROT, ALBUMIN,  in the last 168 hours No results found for this basename: LIPASE, AMYLASE,  in the last 168 hours No results found for this basename: AMMONIA,  in the last 168 hours CBC:  Recent Labs Lab 01/28/14 1548 01/29/14 0620  WBC 6.0 5.6  HGB 10.3* 8.6*  HCT 29.8* 25.5*  MCV 90.3 92.1  PLT 176 148*   Cardiac Enzymes:  Recent Labs Lab 01/28/14 1552  TROPONINI <0.30   BNP: No components found with this basename: POCBNP,  CBG:  Recent Labs Lab 01/28/14 1138 01/28/14 1348 01/28/14 1612 01/28/14 2125 01/29/14 0623  GLUCAP 127* 109* 118* 94 150*     Micro Results: Recent Results (from the past 240 hour(s))  SURGICAL PCR SCREEN     Status: None   Collection Time    01/22/14 11:25 AM      Result Value Ref Range Status   MRSA, PCR NEGATIVE  NEGATIVE Final   Staphylococcus aureus NEGATIVE  NEGATIVE Final   Comment:  The Xpert SA Assay (FDA     approved for NASAL specimens     in patients over 61 years of age),     is one component of     a comprehensive surveillance     program.  Test performance has     been validated by [employer] for patients greater     than or equal to 29 year old.     It is not intended     to diagnose infection nor to     guide or monitor treatment.    Studies/Results: Dg Lumbar Spine 2-3 Views  01/27/2014   CLINICAL DATA:  History of degenerative disc disease.  EXAM: LUMBAR SPINE - 2-3 VIEW  COMPARISON:  MRI 09/03/2013  FINDINGS: Three lateral images were  submitted. On the first image, there are surgical markers along the posterior aspect of L3-L4 and L4-5. On the last image, the markers are along the posterior aspect of L4 and L4-L5.  IMPRESSION: Surgical marking as described.   Electronically Signed   By: Markus Daft M.D.   On: 01/27/2014 15:14    Medications: Scheduled Meds: . acetaminophen  1,000 mg Oral 3 times per day  . ARIPiprazole  5 mg Oral Daily  . aspirin  81 mg Oral Daily  . atorvastatin  20 mg Oral Daily  . divalproex  500 mg Oral QHS  . divalproex  250 mg Oral Daily  . docusate sodium  100 mg Oral BID  . FLUoxetine  40 mg Oral Daily  . fluticasone  1 spray Each Nare Daily  . gabapentin  800 mg Oral QID  . insulin aspart  0-15 Units Subcutaneous TID WC  . insulin aspart  0-5 Units Subcutaneous QHS  . pantoprazole  40 mg Oral Q1200  . sodium chloride  3 mL Intravenous Q12H  . tamsulosin  0.4 mg Oral QPC breakfast  . traZODone  100 mg Oral QHS      LOS: 2 days   RAI,RIPUDEEP M.D. [facility] 01/29/2014, 7:40 AM Pager: [phone]  If 7PM-7AM, please contact night-coverage [email or web address] Password [email or web address]  **Disclaimer: This note was dictated with voice recognition software. Similar sounding words can inadvertently be transcribed and this note may contain transcription errors which may not have been corrected upon publication of note.**

## 2014-01-29 NOTE — Progress Notes (Signed)
Patient ID: Travis [name], male   DOB: [birth year], 63 y.o.   MRN: [id number] Subjective: 2 Days Post-Op Procedure(s) (LRB): CENTRAL LUMBAR LAMINECTOMY L4-5 AND L3-4 (N/A) Awake, alert and oriented x 4. No BM, no flatus. BP is improved. Sleepy and drowsy. Patient reports pain as moderate.  [facility] hospitalist's input appreciated.   Objective:   VITALS:  Temp:  [97.5 F (36.4 C)-100.6 F (38.1 C)] 99.1 F (37.3 C) (09/10 0730) Pulse Rate:  [80-107] 80 (09/10 0730) Resp:  [18] 18 (09/10 0538) BP: (77-102)/(39-55) 102/44 mmHg (09/10 0730) SpO2:  [94 %-100 %] 95 % (09/10 0730)  Neurologically intact ABD soft Neurovascular intact Sensation intact distally Incision: no drainage Abdomen is soft, NT few BS.   LABS  Recent Labs  01/28/14 0417 01/28/14 1548 01/29/14 0620  HGB 10.8* 10.3* 8.6*  WBC 5.7 6.0 5.6  PLT 198 176 148*    Recent Labs  01/28/14 0417 01/29/14 0620  NA 141 138  K 5.0 5.1  CL 103 104  CO2 27 24  BUN 29* 29*  CREATININE 1.00 1.08  GLUCOSE 126* 156*   No results found for this basename: LABPT, INR,  in the last 72 hours   Assessment/Plan: 2 Days Post-Op Procedure(s) (LRB): CENTRAL LUMBAR LAMINECTOMY L4-5 AND L3-4 (N/A) Periop acute blood loss anemia. Hypotension, multifactoral improved, hold antihypertensives, watch muscle relaxants and decrease narcotic meds. IVF for volume. Low grade temp likely pulmonary.  Advance diet Up with therapy Plan for discharge tomorrow IS and pulmonary toliet. Will continue IVF and check CXR.  NITKA,JAMES E 01/29/2014, 8:08 AM

## 2014-01-30 DIAGNOSIS — D62 Acute posthemorrhagic anemia: Secondary | ICD-10-CM

## 2014-01-30 DIAGNOSIS — I959 Hypotension, unspecified: Secondary | ICD-10-CM

## 2014-01-30 DIAGNOSIS — I9589 Other hypotension: Secondary | ICD-10-CM | POA: Diagnosis not present

## 2014-01-30 DIAGNOSIS — F411 Generalized anxiety disorder: Secondary | ICD-10-CM

## 2014-01-30 DIAGNOSIS — G894 Chronic pain syndrome: Secondary | ICD-10-CM

## 2014-01-30 DIAGNOSIS — M48061 Spinal stenosis, lumbar region without neurogenic claudication: Secondary | ICD-10-CM | POA: Diagnosis not present

## 2014-01-30 LAB — GLUCOSE, CAPILLARY
GLUCOSE-CAPILLARY: 119 mg/dL — AB (ref 70–99)
Glucose-Capillary: 108 mg/dL — ABNORMAL HIGH (ref 70–99)
Glucose-Capillary: 122 mg/dL — ABNORMAL HIGH (ref 70–99)
Glucose-Capillary: 102 mg/dL — ABNORMAL HIGH (ref 70–99)

## 2014-01-30 LAB — URINE CULTURE
COLONY COUNT: NO GROWTH
CULTURE: NO GROWTH

## 2014-01-30 LAB — BASIC METABOLIC PANEL
ANION GAP: 9 (ref 5–15)
BUN: 18 mg/dL (ref 6–23)
CALCIUM: 8.6 mg/dL (ref 8.4–10.5)
CO2: 25 mEq/L (ref 19–32)
CREATININE: 0.79 mg/dL (ref 0.50–1.35)
Chloride: 102 mEq/L (ref 96–112)
GFR calc Af Amer: >90 mL/min (ref >90)
Glucose, Bld: 113 mg/dL — ABNORMAL HIGH (ref 70–99)
Potassium: 4.9 mEq/L (ref 3.7–5.3)
Sodium: 136 mEq/L — ABNORMAL LOW (ref 137–147)

## 2014-01-30 LAB — CBC
HCT: 23.7 % — ABNORMAL LOW (ref 39.0–52.0)
Hemoglobin: 8.1 g/dL — ABNORMAL LOW (ref 13.0–17.0)
MCH: 30.8 pg (ref 26.0–34.0)
MCHC: 34.2 g/dL (ref 30.0–36.0)
MCV: 90.1 fL (ref 78.0–100.0)
PLATELETS: 161 10*3/uL (ref 150–400)
RBC: 2.63 MIL/uL — ABNORMAL LOW (ref 4.22–5.81)
RDW: 12.5 % (ref 11.5–15.5)
WBC: 4.6 10*3/uL (ref 4.0–10.5)

## 2014-01-30 MED ORDER — PRASUGREL HCL 10 MG PO TABS: 10.0000 mg | ORAL_TABLET | Freq: Every day | ORAL | Status: DC

## 2014-01-30 MED ORDER — AMLODIPINE BESYLATE 5 MG PO TABS: 5.0000 mg | ORAL_TABLET | Freq: Every day | ORAL | Status: DC

## 2014-01-30 MED ORDER — FLEET ENEMA 7-19 GM/118ML RE ENEM: 1.0000 | ENEMA | Freq: Once | RECTAL | Status: DC

## 2014-01-30 MED ORDER — BENAZEPRIL HCL 20 MG PO TABS: 20.0000 mg | ORAL_TABLET | Freq: Every day | ORAL | Status: DC

## 2014-01-30 MED ORDER — OXYCODONE HCL 5 MG PO TABS
30.0000 mg | ORAL_TABLET | ORAL | Status: DC | PRN
  Administered 2014-01-30 – 2014-01-31 (×3): 30 mg via ORAL
  Filled 2014-01-30 (×3): qty 6

## 2014-01-30 MED ORDER — ISOSORBIDE MONONITRATE ER 60 MG PO TB24: 60.0000 mg | ORAL_TABLET | Freq: Every day | ORAL | Status: DC

## 2014-01-30 MED ORDER — FERROUS GLUCONATE 324 (38 FE) MG PO TABS
324.0000 mg | ORAL_TABLET | Freq: Three times a day (TID) | ORAL | Status: DC
  Administered 2014-01-30 – 2014-01-31 (×3): 324 mg via ORAL
  Filled 2014-01-30 (×6): qty 1

## 2014-01-30 NOTE — Progress Notes (Signed)
Utilization review completed.  

## 2014-01-30 NOTE — Progress Notes (Signed)
Occupational Therapy Treatment Patient Details Name: Travis [name] MRN: [id number] DOB: March 11, 1951 Today's Date: 01/30/2014    History of present illness s/p laminectomy L3-4 and L4-5   OT comments  Pt. Was cooperative with therapy and was ed. On increasing I with ADLs with use of AE. Pt. States he has equipment at home. Pt. Does have family at home to A with ADLs as needed. Pt. Is in agreement with need for 3-1 commode to adhere to back precautions.   Follow Up Recommendations  Supervision/Assistance - 24 hour    Equipment Recommendations  3 in 1 bedside comode    Recommendations for Other Services      Precautions / Restrictions Precautions Precautions: Back Precaution Comments: reviewed precautions       Mobility Bed Mobility Overal bed mobility: Modified Independent Bed Mobility: Sidelying to Sit              Transfers Overall transfer level: Needs assistance   Transfers: Sit to/from Stand Sit to Stand: Mod assist Stand pivot transfers: Min guard            Balance                                   ADL       Grooming: Wash/dry hands;Supervision/safety   Upper Body Bathing: Supervision/ safety   Lower Body Bathing: Supervison/ safety   Upper Body Dressing : Set up   Lower Body Dressing: Minimal assistance   Toilet Transfer: Min guard           Functional mobility during ADLs: Supervision/safety;Min guard General ADL Comments:  (educated pt. on back precautions for ADLs.)      Vision                     Perception     Praxis      Cognition   Behavior During Therapy: WFL for tasks assessed/performed Overall Cognitive Status: Within Functional Limits for tasks assessed                       Extremity/Trunk Assessment               Exercises     Shoulder Instructions       General Comments      Pertinent Vitals/ Pain       Pain Assessment: 0-10 Pain Score: 6  Pain Location:   (back) Pain Descriptors / Indicators: Aching Pain Intervention(s): Monitored during session  Home Living                                          Prior Functioning/Environment              Frequency Min 2X/week     Progress Toward Goals  OT Goals(current goals can now be found in the care plan section)  Progress towards OT goals: Progressing toward goals     Plan      Co-evaluation                 End of Session Equipment Utilized During Treatment: Gait belt   Activity Tolerance Patient tolerated treatment well   Patient Left in chair   Nurse Communication          Time: 4163-8453 OT Time Calculation (  min): 39 min  Charges: OT General Charges $OT Visit: 1 Procedure OT Treatments $Self Care/Home Management : 38-52 mins  Solana Coggin 01/30/2014, 8:14 AM

## 2014-01-30 NOTE — Progress Notes (Signed)
Physical Therapy Treatment Patient Details Name: Travis [name] MRN: [id number] DOB: 31-May-1950 Today's Date: 01/30/2014    History of Present Illness s/p laminectomy L3-4 and L4-5    PT Comments    Pt moving slowly, though able to increase ambulation distance.  Pt very painful in back and R knee during mobility, though indicates having had pain meds prior to PT.  Will continue to follow.    Follow Up Recommendations  Home health PT;Supervision for mobility/OOB     Equipment Recommendations  None recommended by PT    Recommendations for Other Services       Precautions / Restrictions Precautions Precautions: Back Precaution Comments: reviewed precautions Restrictions Weight Bearing Restrictions: No    Mobility  Bed Mobility Overal bed mobility: Needs Assistance Bed Mobility: Rolling;Sidelying to Sit Rolling: Min assist Sidelying to sit: Min assist       General bed mobility comments: cues for log roll technique.    Transfers Overall transfer level: Needs assistance Equipment used: Rolling walker (2 wheeled) Transfers: Sit to/from Stand Sit to Stand: Mod assist         General transfer comment: cues for UE use and using momentum to A with power up to stand.  Repeated sit to/from stands x4 with pt becoming MinA.  pt indicates R knee painful during trasnfers.    Ambulation/Gait Ambulation/Gait assistance: Min guard Ambulation Distance (Feet): 15 Feet Assistive device: Rolling walker (2 wheeled) Gait Pattern/deviations: Step-through pattern;Decreased stride length;Trunk flexed   Gait velocity interpretation: Below normal speed for age/gender General Gait Details: pt moving slowly, but able to increase ambulation distance today.  pt indicates very painful in low back and R knee.  cueing for upright posture and encouragement.     Stairs            Wheelchair Mobility    Modified Rankin (Stroke Patients Only)       Balance Overall balance  assessment: Needs assistance Sitting-balance support: Single extremity supported;Feet supported Sitting balance-Leahy Scale: Poor     Standing balance support: Bilateral upper extremity supported Standing balance-Leahy Scale: Poor                      Cognition Arousal/Alertness: Awake/alert Behavior During Therapy: WFL for tasks assessed/performed Overall Cognitive Status: Within Functional Limits for tasks assessed                      Exercises      General Comments        Pertinent Vitals/Pain Pain Assessment: 0-10 Pain Score: 9  Pain Location: Back Pain Descriptors / Indicators: Aching;Constant Pain Intervention(s): Repositioned;Premedicated before session;Limited activity within patient's tolerance    Home Living                      Prior Function            PT Goals (current goals can now be found in the care plan section) Acute Rehab PT Goals Patient Stated Goal: not stated PT Goal Formulation: With patient Time For Goal Achievement: 02/05/14 Potential to Achieve Goals: Good Progress towards PT goals: Progressing toward goals    Frequency  Min 5X/week    PT Plan Current plan remains appropriate    Co-evaluation             End of Session Equipment Utilized During Treatment: Gait belt Activity Tolerance: Patient limited by fatigue;Patient limited by pain Patient left: in chair;with call bell/phone within  reach     Time: 1537-9432 PT Time Calculation (min): 21 min  Charges:  $Gait Training: 8-22 mins                    G CodesCatarina Hartshorn, [facility] 01/30/2014, 2:46 PM

## 2014-01-30 NOTE — Progress Notes (Signed)
Patient ID: Travis [name], male   DOB: 13-Sep-1950, 63 y.o.   MRN: [id number] Subjective: 3 Days Post-Op Procedure(s) (LRB): CENTRAL LUMBAR LAMINECTOMY L4-5 AND L3-4 (N/A) Awake, alert and oriented x4. More awake this am. Patient reports pain as moderate.    Objective:   VITALS:  Temp:  [97.5 F (36.4 C)-98.6 F (37 C)] 98.6 F (37 C) (09/11 0636) Pulse Rate:  [67-79] 67 (09/11 0636) Resp:  [16-18] 16 (09/11 0636) BP: (78-121)/(31-69) 98/50 mmHg (09/11 0700) SpO2:  [100 %] 100 % (09/11 0636)  Neurologically intact ABD soft Sensation intact distally Intact pulses distally Dorsiflexion/Plantar flexion intact Incision: no drainage   LABS  Recent Labs  01/28/14 0417 01/28/14 1548 01/29/14 0620 01/30/14 0559  HGB 10.8* 10.3* 8.6* 8.1*  WBC 5.7 6.0 5.6 4.6  PLT 198 176 148* 161    Recent Labs  01/29/14 0620 01/30/14 0559  NA 138 136*  K 5.1 4.9  CL 104 102  CO2 24 25  BUN 29* 18  CREATININE 1.08 0.79  GLUCOSE 156* 113*   No results found for this basename: LABPT, INR,  in the last 72 hours   Assessment/Plan: 3 Days Post-Op Procedure(s) (LRB): CENTRAL LUMBAR LAMINECTOMY L4-5 AND L3-4 (N/A) Post operative acute blood loss anemia. Hypotension, holding antihypertensive agents. Constipation.  Advance diet Up with therapy Discharge home with home health if orthostasis BP is normal. Allow to shower today. Check orthostatic BP. Check Cortisol, start Fe gluconate.  Jhon Mallozzi E 01/30/2014, 8:22 AM

## 2014-01-30 NOTE — Progress Notes (Signed)
Patient ID: Travis [name]  male  AOZ:[id number]    DOB: 05-30-1950    DOA: 01/27/2014  PCP: [name], MD  Assessment/Plan:  Principal Problem:  Spinal stenosis, lumbar region, with neurogenic claudication  - status post central lumbar laminectomy L4-5, L3-4  - feels better today, sitting up in chair, anticipating Dc today, lives with brother   Active Problems:  Hypotension: BP still soft but improving from last 24hrs, no fevers   - possibly related to the effect of sedating medications, benzo's and narcotics and acute blood loss anemia, HTN meds  - I have recommended pt to HOLD Norvasc, Imdur, Lisinopril for next 3 days, recheck BP at home, if SBP consistently running above >130 or DBP >90, he can slowly restart BP meds -  lactic acid normal, UA neg, CXR clear - agree with orthostatic vitals, cortisol level pending  Acute blood loss anemia - post op related likely  - no signs of active bleeding, Hb 8.1, likely some component of hemodilution, but down from 10.3, will leave to primary team if wants to transfuse 1 unit pRBC's  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  - resume Metformin upon discharge  - SSI provided inpatient   Constipation - no signs of ileus or obstruction, pt feels ready for BM  DVT Prophylaxis: SCD's  Code Status:  Family Communication:  Disposition: per primary team, likely okay to DC home with supervision today   Antibiotics:  None    Subjective: BP soft but clinically better, alert and oriented    Objective: Weight change:   Intake/Output Summary (Last 24 hours) at 01/30/14 0844 Last data filed at 01/30/14 9629  Gross per 24 hour  Intake 2507.5 ml  Output   1285 ml  Net 1222.5 ml   Blood pressure 102/52, pulse 67, temperature 98.6 F (37 C), temperature source Oral, resp. rate 16, height 6' 1"  (1.854 m), weight 123.378 kg (272 lb), SpO2 100.00%.  Physical Exam: General: A XOx3, NAD CVS: S1-S2 clear, no murmur rubs or  gallops Chest: clear to auscultation bilaterally, no wheezing, rales or rhonchi Abdomen: soft nontender, nondistended, normal bowel sounds  Extremities: no cyanosis, clubbing or edema noted bilaterally Neuro: Cranial nerves II-XII intact, no focal neurological deficits  Lab Results: Basic Metabolic Panel:  Recent Labs Lab 01/29/14 0620 01/30/14 0559  NA 138 136*  K 5.1 4.9  CL 104 102  CO2 24 25  GLUCOSE 156* 113*  BUN 29* 18  CREATININE 1.08 0.79  CALCIUM 8.2* 8.6   Liver Function Tests: No results found for this basename: AST, ALT, ALKPHOS, BILITOT, PROT, ALBUMIN,  in the last 168 hours No results found for this basename: LIPASE, AMYLASE,  in the last 168 hours No results found for this basename: AMMONIA,  in the last 168 hours CBC:  Recent Labs Lab 01/29/14 0620 01/30/14 0559  WBC 5.6 4.6  HGB 8.6* 8.1*  HCT 25.5* 23.7*  MCV 92.1 90.1  PLT 148* 161   Cardiac Enzymes:  Recent Labs Lab 01/28/14 1552  TROPONINI <0.30   BNP: No components found with this basename: POCBNP,  CBG:  Recent Labs Lab 01/29/14 0623 01/29/14 1104 01/29/14 1614 01/29/14 2250 01/30/14 0647  GLUCAP 150* 138* 114* 96 108*     Micro Results: Recent Results (from the past 240 hour(s))  SURGICAL PCR SCREEN     Status: None   Collection Time    01/22/14 11:25 AM      Result Value Ref Range Status  MRSA, PCR NEGATIVE  NEGATIVE Final   Staphylococcus aureus NEGATIVE  NEGATIVE Final   Comment:            The Xpert SA Assay (FDA     approved for NASAL specimens     in patients over 49 years of age),     is one component of     a comprehensive surveillance     program.  Test performance has     been validated by [employer] for patients greater     than or equal to 7 year old.     It is not intended     to diagnose infection nor to     guide or monitor treatment.    Studies/Results: Dg Lumbar Spine 2-3 Views  01/27/2014   CLINICAL DATA:  History of degenerative disc  disease.  EXAM: LUMBAR SPINE - 2-3 VIEW  COMPARISON:  MRI 09/03/2013  FINDINGS: Three lateral images were submitted. On the first image, there are surgical markers along the posterior aspect of L3-L4 and L4-5. On the last image, the markers are along the posterior aspect of L4 and L4-L5.  IMPRESSION: Surgical marking as described.   Electronically Signed   By: Markus Daft M.D.   On: 01/27/2014 15:14    Medications: Scheduled Meds: . acetaminophen  1,000 mg Oral 3 times per day  . ARIPiprazole  5 mg Oral Daily  . aspirin  81 mg Oral Daily  . atorvastatin  20 mg Oral Daily  . bisacodyl  10 mg Rectal Once  . divalproex  500 mg Oral QHS  . divalproex  250 mg Oral Daily  . docusate sodium  100 mg Oral BID  . ferrous gluconate  324 mg Oral TID WC  . FLUoxetine  40 mg Oral Daily  . fluticasone  1 spray Each Nare Daily  . gabapentin  800 mg Oral QID  . insulin aspart  0-15 Units Subcutaneous TID WC  . insulin aspart  0-5 Units Subcutaneous QHS  . pantoprazole  40 mg Oral Q1200  . polyethylene glycol  17 g Oral Daily  . senna-docusate  1 tablet Oral QHS  . sodium phosphate  1 enema Rectal Once  . tamsulosin  0.4 mg Oral QPC breakfast  . traZODone  50 mg Oral QHS      LOS: 3 days   RAI,RIPUDEEP M.D. [facility] 01/30/2014, 8:44 AM Pager: [phone]  If 7PM-7AM, please contact night-coverage [email or web address] Password [email or web address]  **Disclaimer: This note was dictated with voice recognition software. Similar sounding words can inadvertently be transcribed and this note may contain transcription errors which may not have been corrected upon publication of note.**

## 2014-01-31 DIAGNOSIS — M48061 Spinal stenosis, lumbar region without neurogenic claudication: Secondary | ICD-10-CM | POA: Diagnosis not present

## 2014-01-31 LAB — GLUCOSE, CAPILLARY
GLUCOSE-CAPILLARY: 120 mg/dL — AB (ref 70–99)
GLUCOSE-CAPILLARY: 127 mg/dL — AB (ref 70–99)

## 2014-01-31 LAB — CORTISOL-AM, BLOOD: Cortisol - AM: 7 ug/dL (ref 4.3–22.4)

## 2014-01-31 MED ORDER — FERROUS GLUCONATE 324 (38 FE) MG PO TABS: 324.0000 mg | ORAL_TABLET | Freq: Three times a day (TID) | ORAL | Status: DC

## 2014-01-31 MED ORDER — BISACODYL 10 MG RE SUPP: 10.0000 mg | Freq: Once | RECTAL | Status: DC

## 2014-01-31 MED ORDER — POLYETHYLENE GLYCOL 3350 17 G PO PACK
17.0000 g | PACK | Freq: Two times a day (BID) | ORAL | Status: DC
  Administered 2014-01-31: 17 g via ORAL
  Filled 2014-01-31 (×2): qty 1

## 2014-01-31 MED ORDER — AMLODIPINE BESYLATE 5 MG PO TABS: 5.0000 mg | ORAL_TABLET | Freq: Every day | ORAL | Status: DC

## 2014-01-31 MED ORDER — AMLODIPINE BESYLATE 5 MG PO TABS
5.0000 mg | ORAL_TABLET | Freq: Every day | ORAL | Status: DC
  Administered 2014-01-31: 5 mg via ORAL
  Filled 2014-01-31: qty 1

## 2014-01-31 MED ORDER — ISOSORBIDE MONONITRATE ER 60 MG PO TB24: 60.0000 mg | ORAL_TABLET | Freq: Every day | ORAL | Status: DC

## 2014-01-31 MED ORDER — POLYETHYLENE GLYCOL 3350 17 G PO PACK: 17.0000 g | PACK | Freq: Two times a day (BID) | ORAL | Status: DC

## 2014-01-31 MED ORDER — BENAZEPRIL HCL 20 MG PO TABS: 20.0000 mg | ORAL_TABLET | Freq: Every day | ORAL | Status: DC

## 2014-01-31 MED ORDER — PRASUGREL HCL 10 MG PO TABS
10.0000 mg | ORAL_TABLET | Freq: Every day | ORAL | Status: DC
  Administered 2014-01-31: 10 mg via ORAL
  Filled 2014-01-31: qty 1

## 2014-01-31 NOTE — Progress Notes (Signed)
Physical Therapy Treatment Patient Details Name: Travis [name] MRN: [id number] DOB: 07/06/1950 Today's Date: 02-24-2014    History of Present Illness s/p laminectomy L3-4 and L4-5    PT Comments    Pt making steady progress with increased activity today. Continues to report high pain levels.  Follow Up Recommendations  Home health PT;Supervision for mobility/OOB     Equipment Recommendations  None recommended by PT       Precautions / Restrictions Precautions Precautions: Back Precaution Comments: reviewed precautions Restrictions Weight Bearing Restrictions: No    Mobility  Bed Mobility               General bed mobility comments: not assessed as pt was in chair before and after session  Transfers Overall transfer level: Needs assistance Equipment used: Rolling walker (2 wheeled) Transfers: Sit to/from Stand Sit to Stand: Min guard Stand pivot transfers: Min guard       General transfer comment: cues for hand placement and ant weight shift forward to assist with standing.  Ambulation/Gait Ambulation/Gait assistance: Min guard;Supervision Ambulation Distance (Feet): 100 Feet Assistive device: Rolling walker (2 wheeled) Gait Pattern/deviations: Step-through pattern;Decreased stride length;Trunk flexed Gait velocity: decreased Gait velocity interpretation: Below normal speed for age/gender General Gait Details: slow and steady gait pattern. cues for posture and walker position with gait. no LOB or buckling noted.   Stairs Stairs: Yes Stairs assistance: Supervision Stair Management: One rail Right;Step to pattern;Forwards Number of Stairs: 3 General stair comments: cues on sequence with stairs (up with strong leg first, down with weak leg first)  Wheelchair Mobility    Modified Rankin (Stroke Patients Only)          Cognition Arousal/Alertness: Awake/alert Behavior During Therapy: WFL for tasks assessed/performed Overall Cognitive Status:  Within Functional Limits for tasks assessed                       Pertinent Vitals/Pain Pain Assessment: 0-10 Pain Score: 9  Pain Location: back and bil hamstring areas Pain Descriptors / Indicators: Aching;Burning;Sore;Constant Pain Intervention(s): Monitored during session;Premedicated before session;Patient requesting pain meds-RN notified;Repositioned;Limited activity within patient's tolerance           PT Goals (current goals can now be found in the care plan section) Acute Rehab PT Goals Patient Stated Goal: not stated PT Goal Formulation: With patient Time For Goal Achievement: 02/05/14 Potential to Achieve Goals: Good Progress towards PT goals: Progressing toward goals    Frequency  Min 5X/week    PT Plan Current plan remains appropriate       End of Session Equipment Utilized During Treatment: Gait belt Activity Tolerance: Patient tolerated treatment well;Patient limited by pain Patient left: in chair;with call bell/phone within reach     Time: 1150-1200 PT Time Calculation (min): 10 min  Charges:  $Gait Training: 8-22 mins                    G Codes:      Willow Ora 02/24/14, 1:27 PM   Willow Ora, PTA Office- [phone]

## 2014-01-31 NOTE — Progress Notes (Signed)
Patient ID: Travis [name]  male  YQM:[id number]    DOB: 1950/06/15    DOA: 01/27/2014  PCP: [name], MD  Assessment/Plan:  Principal Problem:  Spinal stenosis, lumbar region, with neurogenic claudication  - status post central lumbar laminectomy L4-5, L3-4   Active Problems:  Hypotension: BP stable now, possibly related to the effect of sedating medications, benzo's and narcotics and acute blood loss anemia, HTN meds. 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 Patient on significant amount of narcotics.   CAD: stable - I have restarted effient, cont ASA, lipitor, norvasc. Patient to start ACEI, imdur at home.   DVT Prophylaxis: SCD's  Code Status:  Family Communication:  Disposition: per primary team, likely okay to DC home with supervision today. I will sign off.    Antibiotics:  None    Subjective: BP stable, feels better, pain controlled   Objective: Weight change:   Intake/Output Summary (Last 24 hours) at 01/31/14 0741 Last data filed at 01/31/14 0014  Gross per 24 hour  Intake   1080 ml  Output   1000 ml  Net     80 ml   Blood pressure 131/57, pulse 68, temperature 97.7 F (36.5 C), temperature source Oral, resp. rate 16, height 6' 1"  (1.854 m), weight 123.378 kg (272 lb), SpO2 100.00%.  Physical Exam: General: A XOx3, NAD CVS: S1-S2 clear, no murmur  rubs or gallops Chest: clear to auscultation bilaterally, no wheezing, rales or rhonchi Abdomen: soft nontender, nondistended, normal bowel sounds  Extremities: no cyanosis, clubbing or edema noted bilaterally Neuro: Cranial nerves II-XII intact, no focal neurological deficits  Lab Results: Basic Metabolic Panel:  Recent Labs Lab 01/29/14 0620 01/30/14 0559  NA 138 136*  K 5.1 4.9  CL 104 102  CO2 24 25  GLUCOSE 156* 113*  BUN 29* 18  CREATININE 1.08 0.79  CALCIUM 8.2* 8.6   Liver Function Tests: No results found for this basename: AST, ALT, ALKPHOS, BILITOT, PROT, ALBUMIN,  in the last 168 hours No results found for this basename: LIPASE, AMYLASE,  in the last 168 hours No results found for this basename: AMMONIA,  in the last 168 hours CBC:  Recent Labs Lab 01/29/14 0620 01/30/14 0559  WBC 5.6 4.6  HGB 8.6* 8.1*  HCT 25.5* 23.7*  MCV 92.1 90.1  PLT 148* 161   Cardiac Enzymes:  Recent Labs Lab 01/28/14 1552  TROPONINI <0.30   BNP: No components found with this basename: POCBNP,  CBG:  Recent Labs Lab 01/30/14 0647 01/30/14 1027 01/30/14 1629 01/30/14 2132 01/31/14 0639  GLUCAP 108* 122* 119* 102* 120*     Micro Results: Recent Results (from the past 240 hour(s))  SURGICAL PCR SCREEN     Status: None   Collection Time    01/22/14 11:25 AM  Result Value Ref Range Status   MRSA, PCR NEGATIVE  NEGATIVE Final   Staphylococcus aureus NEGATIVE  NEGATIVE Final   Comment:            The Xpert SA Assay (FDA     approved for NASAL specimens     in patients over 46 years of age),     is one component of     a comprehensive surveillance     program.  Test performance has     been validated by [employer] for patients greater     than or equal to 63 year old.     It is not intended     to diagnose infection nor to     guide or monitor treatment.  URINE CULTURE     Status: None   Collection Time    01/29/14 10:25 AM      Result Value Ref  Range Status   Specimen Description URINE, CATHETERIZED   Final   Special Requests NONE   Final   Culture  Setup Time     Final   Value: 01/29/2014 15:38     Performed at [place]     Final   Value: NO GROWTH     Performed at [facility]   Culture     Final   Value: NO GROWTH     Performed at [facility]   Report Status 01/30/2014 FINAL   Final    Studies/Results: Dg Lumbar Spine 2-3 Views  01/27/2014   CLINICAL DATA:  History of degenerative disc disease.  EXAM: LUMBAR SPINE - 2-3 VIEW  COMPARISON:  MRI 09/03/2013  FINDINGS: Three lateral images were submitted. On the first image, there are surgical markers along the posterior aspect of L3-L4 and L4-5. On the last image, the markers are along the posterior aspect of L4 and L4-L5.  IMPRESSION: Surgical marking as described.   Electronically Signed   By: Markus Daft M.D.   On: 01/27/2014 15:14    Medications: Scheduled Meds: . acetaminophen  1,000 mg Oral 3 times per day  . ARIPiprazole  5 mg Oral Daily  . aspirin  81 mg Oral Daily  . atorvastatin  20 mg Oral Daily  . bisacodyl  10 mg Rectal Once  . divalproex  500 mg Oral QHS  . divalproex  250 mg Oral Daily  . docusate sodium  100 mg Oral BID  . ferrous gluconate  324 mg Oral TID WC  . FLUoxetine  40 mg Oral Daily  . fluticasone  1 spray Each Nare Daily  . gabapentin  800 mg Oral QID  . insulin aspart  0-15 Units Subcutaneous TID WC  . insulin aspart  0-5 Units Subcutaneous QHS  . pantoprazole  40 mg Oral Q1200  . polyethylene glycol  17 g Oral Daily  . prasugrel  10 mg Oral Daily  . senna-docusate  1 tablet Oral QHS  . sodium phosphate  1 enema Rectal Once  . tamsulosin  0.4 mg Oral QPC breakfast  . traZODone  50 mg Oral QHS      LOS: 4 days   RAI,RIPUDEEP M.D. [facility] 01/31/2014, 7:41 AM Pager: [phone]  If 7PM-7AM, please contact night-coverage [email or web address] Password [email or web address]  **Disclaimer: This note was  dictated with voice recognition software. Similar sounding words can inadvertently be transcribed and this note may contain transcription errors which may not have been corrected upon  publication of note.**

## 2014-01-31 NOTE — Discharge Summary (Signed)
Patient ID: Travis [name] MRN: [id number] DOB/AGE: 1951/03/18 63 y.o.  Admit date: 01/27/2014 Discharge date: 01/31/2014  Admission Diagnoses:  Principal Problem:   Spinal stenosis, lumbar region, with neurogenic claudication Active Problems:   Spinal stenosis, lumbar   Postoperative anemia due to acute blood loss   Hypotension, unspecified   Discharge Diagnoses:  Same  Past Medical History  Diagnosis Date  . Impaired glucose tolerance 09/27/2010  . HYPERLIPIDEMIA-MIXED 07/15/2009  . Chronic pain syndrome 10/27/2009  . HYPERTENSION 06/24/2009  . CORONARY ARTERY DISEASE 06/24/2009    a. s/p multiple PCIs - In 2008 he had a Taxus DES to the mild LAD, Endeavor DES to mid LCX and distal LCX. In January 2009 he had DES to distal LCX, mid LCX and proximal LCX. In November 2009 had BMS x 2 to the mid RCA. Cath 10/2011 with patent stents, noncardiac CP. LHC 01/2013: patent stents (noncardiac CP).  [name] URETHRAL STRICTURE 06/24/2009  . SHOULDER PAIN, BILATERAL 06/24/2009  . ANKLE PAIN, RIGHT 06/24/2009  . SCIATICA, LEFT 04/19/2010  . NEPHROLITHIASIS, HX OF 06/24/2009  . ERECTILE DYSFUNCTION, ORGANIC 05/30/2010  . Obesity   . Allergy to clopidogrel   . ALLERGIC RHINITIS 06/24/2009  . Anxiety   . Depression     Prior suicide attempt  . Irregular heart beat   . Pneumonia   . Self-catheterizes urinary bladder   . Anginal pain   . Myocardial infarction 1998  . OSA (obstructive sleep apnea)     not using CPAP (09/30/2013)  . Type II diabetes mellitus   . Hepatitis C dx'd 1979  . DEGENERATIVE JOINT DISEASE 06/24/2009  . [place] DISEASE, LUMBAR 04/19/2010  . Arthritis     "all my joints" (09/30/2013)  . Fibromyalgia     "left leg" (09/30/2013)  . Chronic back pain     "my whole back" (09/30/2013)  . Kidney stones     "passed on my own"    Surgeries: Procedure(s): CENTRAL LUMBAR LAMINECTOMY L4-5 AND L3-4 on 01/27/2014   Consultants: Treatment Team:  Theodis Blaze, MD  Discharged Condition:  Improved  Hospital Course: Travis [name] is an 63 y.o. male who was admitted 01/27/2014 for operative treatment ofSpinal stenosis, lumbar region, with neurogenic claudication. Patient has severe unremitting pain that affects sleep, daily activities, and work/hobbies. After pre-op clearance the patient was taken to the operating room on 01/27/2014 and underwent  Procedure(s): CENTRAL LUMBAR LAMINECTOMY L4-5 AND L3-4.    Patient was given perioperative antibiotics: Anti-infectives   Start     Dose/Rate Route Frequency Ordered Stop   01/27/14 1900  ceFAZolin (ANCEF) IVPB 1 g/50 mL premix     1 g 100 mL/hr over 30 Minutes Intravenous Every 8 hours 01/27/14 1842 01/28/14 0332   01/27/14 1343  ceFAZolin (ANCEF) 1-5 GM-% IVPB    Comments:  Vira Agar, Beth   : cabinet override      01/27/14 1343 01/28/14 0159   01/27/14 1343  ceFAZolin (ANCEF) 2-3 GM-% IVPB SOLR    Comments:  Vira Agar, Beth   : cabinet override      01/27/14 1343 01/27/14 1431       Patient was given sequential compression devices, early ambulation, and chemoprophylaxis to prevent DVT.  Patient benefited maximally from hospital stay.  He did experience acute blood loss anemia from surgery that did require a transfusion.  Recent vital signs: Patient Vitals for the past 24 hrs:  BP Temp Temp src Pulse Resp SpO2  01/31/14 0741 129/46 mmHg - - 72 - -  01/31/14 0609 131/57 mmHg 97.7 F (36.5 C) Oral 68 - 100 %  01/30/14 2052 119/49 mmHg 97.4 F (36.3 C) Oral 66 - 100 %  01/30/14 1326 117/53 mmHg 99.1 F (37.3 C) - 67 16 100 %  01/30/14 1036 105/46 mmHg 98.1 F (36.7 C) - 65 16 93 %     Recent laboratory studies:  Recent Labs  01/29/14 0620 01/30/14 0559  WBC 5.6 4.6  HGB 8.6* 8.1*  HCT 25.5* 23.7*  PLT 148* 161  NA 138 136*  K 5.1 4.9  CL 104 102  CO2 24 25  BUN 29* 18  CREATININE 1.08 0.79  GLUCOSE 156* 113*  CALCIUM 8.2* 8.6     Discharge Medications:     Medication List         amLODipine 5 MG tablet   Commonly known as:  NORVASC  Take 1 tablet (5 mg total) by mouth daily.     ARIPiprazole 5 MG tablet  Commonly known as:  ABILIFY  Take 5 mg by mouth daily.     aspirin 81 MG tablet  Take 1 tablet (81 mg total) by mouth daily.     atorvastatin 20 MG tablet  Commonly known as:  LIPITOR  Take 20 mg by mouth daily.     benazepril 20 MG tablet  Commonly known as:  LOTENSIN  Take 1 tablet (20 mg total) by mouth daily. HOLD for next 2 days     clonazePAM 2 MG tablet  Commonly known as:  KLONOPIN  Take one tablet by mouth twice daily as needed for anxiety     cyclobenzaprine 5 MG tablet  Commonly known as:  FLEXERIL  Take 1 tablet (5 mg total) by mouth 3 (three) times daily as needed for muscle spasms.     divalproex 250 MG DR tablet  Commonly known as:  DEPAKOTE  Take 250-500 mg by mouth 2 (two) times daily. 250 mg in morning and 500 mg at bedtime     ferrous gluconate 324 MG tablet  Commonly known as:  FERGON  Take 1 tablet (324 mg total) by mouth 3 (three) times daily with meals.     Fish Oil 1000 MG Caps  Take 2 capsules by mouth daily.     FLUoxetine 40 MG capsule  Commonly known as:  PROZAC  Take 40 mg by mouth daily.     fluticasone 50 MCG/ACT nasal spray  Commonly known as:  FLONASE  Place 1 spray into both nostrils daily.     gabapentin 400 MG capsule  Commonly known as:  NEURONTIN  Take 800 mg by mouth 4 (four) times daily.     isosorbide mononitrate 60 MG 24 hr tablet  Commonly known as:  IMDUR  Take 1 tablet (60 mg total) by mouth daily. HOLD for next 2 days     metFORMIN 500 MG tablet  Commonly known as:  GLUCOPHAGE  Take by mouth 2 (two) times daily with a meal.     oxyCODONE 15 MG immediate release tablet  Commonly known as:  ROXICODONE  Take 30 mg by mouth every 4 (four) hours as needed for pain. Take one tablet by mouth every 4 hours as needed for severe pain     oxyCODONE 15 MG immediate release tablet  Commonly known as:  ROXICODONE  1-2  tabs q 3-4 hrs prn severe breakthru pain.     polyethylene glycol packet  Commonly known as:  MIRALAX / GLYCOLAX  Take 17 g  by mouth 2 (two) times daily.     prasugrel 10 MG Tabs tablet  Commonly known as:  EFFIENT  Take 1 tablet (10 mg total) by mouth daily.  Start taking on:  02/03/2014     sildenafil 50 MG tablet  Commonly known as:  VIAGRA  Take 1 tablet (50 mg total) by mouth daily as needed for erectile dysfunction.     tamsulosin 0.4 MG Caps capsule  Commonly known as:  FLOMAX  Take 0.4 mg by mouth daily after breakfast.     traZODone 100 MG tablet  Commonly known as:  DESYREL  Take 100 mg by mouth at bedtime.        Diagnostic Studies: Dg Lumbar Spine 2-3 Views  01/27/2014   CLINICAL DATA:  History of degenerative disc disease.  EXAM: LUMBAR SPINE - 2-3 VIEW  COMPARISON:  MRI 09/03/2013  FINDINGS: Three lateral images were submitted. On the first image, there are surgical markers along the posterior aspect of L3-L4 and L4-5. On the last image, the markers are along the posterior aspect of L4 and L4-L5.  IMPRESSION: Surgical marking as described.   Electronically Signed   By: Markus Daft M.D.   On: 01/27/2014 15:14   Dg Chest Port 1 View  01/29/2014   CLINICAL DATA:  Foci of hypotension  EXAM: PORTABLE CHEST - 1 VIEW  COMPARISON:  09/28/2013  FINDINGS: There is left midlung linear band of airspace disease likely reflecting atelectasis. There is no focal consolidation, pleural effusion or pneumothorax. The heart and mediastinal contours are unremarkable.  The osseous structures are unremarkable.  IMPRESSION: No active disease.   Electronically Signed   By: Kathreen Devoid   On: 01/29/2014 07:58    Disposition: to home      Discharge Instructions   Call MD / Call 911    Complete by:  As directed   If you experience chest pain or shortness of breath, CALL 911 and be transported to the hospital emergency room.  If you develope a fever above 101 F, pus (white drainage) or increased  drainage or redness at the wound, or calf pain, call your surgeon's office.     Commode elevated 3 in 1    Complete by:  As directed   DME FOR HOME USE     Constipation Prevention    Complete by:  As directed   Drink plenty of fluids.  Prune juice may be helpful.  You may use a stool softener, such as Colace (over the counter) 100 mg twice a day.  Use MiraLax (over the counter) for constipation as needed.     Diet - low sodium heart healthy    Complete by:  As directed      Discharge instructions    Complete by:  As directed   No lifting greater than 10 lbs. Avoid bending, stooping and twisting. Walk in house for first week them may start to get out slowly increasing distance up to one mile by 3 weeks post op. Keep incision dry for 7 days, may use tegaderm or similar water impervious dressing. Keep covered until no drainage. Ice packs as needed for pain and swelling  HOLD EFFIENT FOR ONE WEEK POST OP.  RESUME ON Tuesday September 15. HOLD BLOOD PRESSURE MEDS AS DIRECTED.     Discharge patient    Complete by:  As directed      Increase activity slowly as tolerated    Complete by:  As directed  Follow-up Information   Follow up with NITKA,JAMES E, MD In 2 weeks.   Specialty:  Orthopedic Surgery   Contact information:   [place] [address] [phone]       Follow up with [facility]. (Someone from [facility] will contact you concerning start date and time for physical therapy.)    Specialty:  [place] information:   [place] [address] [phone]       Follow up with NITKA,JAMES E, MD. Schedule an appointment as soon as possible for a visit in 2 weeks.   Specialty:  Orthopedic Surgery   Contact information:   [place] [address] [phone]        Signed: Mcarthur Rossetti 01/31/2014, 9:36 AM

## 2014-01-31 NOTE — Progress Notes (Signed)
Subjective: 4 Days Post-Op Procedure(s) (LRB): CENTRAL LUMBAR LAMINECTOMY L4-5 AND L3-4 (N/A) Patient reports pain as moderate.  Feels much better.  Denies fatigue or light-headedness.  H/H still low with acute blood loss anemia, but now tolerating well.  Would like to go home today.  Objective: Vital signs in last 24 hours: Temp:  [97.4 F (36.3 C)-99.1 F (37.3 C)] 97.7 F (36.5 C) (09/12 0609) Pulse Rate:  [65-72] 72 (09/12 0741) Resp:  [16] 16 (09/11 1326) BP: (105-131)/(46-57) 129/46 mmHg (09/12 0741) SpO2:  [93 %-100 %] 100 % (09/12 0609)  Intake/Output from previous day: 09/11 0701 - 09/12 0700 In: 1080 [P.O.:1080] Out: 1000 [Urine:1000] Intake/Output this shift:     Recent Labs  01/28/14 1548 01/29/14 0620 01/30/14 0559  HGB 10.3* 8.6* 8.1*    Recent Labs  01/29/14 0620 01/30/14 0559  WBC 5.6 4.6  RBC 2.77* 2.63*  HCT 25.5* 23.7*  PLT 148* 161    Recent Labs  01/29/14 0620 01/30/14 0559  NA 138 136*  K 5.1 4.9  CL 104 102  CO2 24 25  BUN 29* 18  CREATININE 1.08 0.79  GLUCOSE 156* 113*  CALCIUM 8.2* 8.6   No results found for this basename: LABPT, INR,  in the last 72 hours  Sensation intact distally Intact pulses distally Dorsiflexion/Plantar flexion intact Incision: scant drainage  Assessment/Plan: 4 Days Post-Op Procedure(s) (LRB): CENTRAL LUMBAR LAMINECTOMY L4-5 AND L3-4 (N/A) OK for discharge to home today.  Mcarthur Rossetti 01/31/2014, 9:33 AM

## 2014-02-13 ENCOUNTER — Other Ambulatory Visit (HOSPITAL_COMMUNITY): Payer: Self-pay | Admitting: Psychiatry

## 2014-02-13 NOTE — Telephone Encounter (Signed)
Chart reviewed, refill approved.

## 2014-02-20 ENCOUNTER — Telehealth (HOSPITAL_COMMUNITY): Payer: Self-pay | Admitting: *Deleted

## 2014-02-20 ENCOUNTER — Other Ambulatory Visit (HOSPITAL_COMMUNITY): Payer: Self-pay | Admitting: Psychiatry

## 2014-02-20 DIAGNOSIS — F319 Bipolar disorder, unspecified: Secondary | ICD-10-CM

## 2014-02-20 MED ORDER — ARIPIPRAZOLE 5 MG PO TABS: 5.0000 mg | ORAL_TABLET | Freq: Every day | ORAL | Status: DC

## 2014-02-20 NOTE — Telephone Encounter (Signed)
Chart reviewed, refill appropriate. Pt had called stating he has no Abilify left and needed a refill. Sent to preferred pharmacy.Notified patient.

## 2014-03-05 ENCOUNTER — Other Ambulatory Visit: Payer: Self-pay | Admitting: Specialist

## 2014-03-05 DIAGNOSIS — M25512 Pain in left shoulder: Secondary | ICD-10-CM

## 2014-03-05 DIAGNOSIS — M545 Low back pain, unspecified: Secondary | ICD-10-CM

## 2014-03-17 ENCOUNTER — Ambulatory Visit
Admission: RE | Admit: 2014-03-17 | Discharge: 2014-03-17 | Disposition: A | Discharge status: Home / Self Care | Payer: Commercial Managed Care - HMO | Source: Ambulatory Visit | Attending: Specialist | Admitting: Specialist

## 2014-03-17 DIAGNOSIS — M545 Low back pain, unspecified: Secondary | ICD-10-CM

## 2014-03-17 DIAGNOSIS — M25512 Pain in left shoulder: Secondary | ICD-10-CM

## 2014-03-18 ENCOUNTER — Telehealth: Payer: Self-pay

## 2014-03-18 MED ORDER — CLONAZEPAM 2 MG PO TABS: ORAL_TABLET | ORAL | Status: DC

## 2014-03-18 NOTE — Telephone Encounter (Signed)
refill 

## 2014-03-18 NOTE — Telephone Encounter (Signed)
Done hardcopy to [facility]  

## 2014-03-18 NOTE — Telephone Encounter (Signed)
Faxed hardcopy for Clonazepam to [facility]

## 2014-03-19 ENCOUNTER — Ambulatory Visit: Payer: Commercial Managed Care - HMO | Attending: Specialist

## 2014-03-19 DIAGNOSIS — R293 Abnormal posture: Secondary | ICD-10-CM | POA: Diagnosis not present

## 2014-03-19 DIAGNOSIS — M4806 Spinal stenosis, lumbar region: Secondary | ICD-10-CM | POA: Insufficient documentation

## 2014-03-19 DIAGNOSIS — R262 Difficulty in walking, not elsewhere classified: Secondary | ICD-10-CM | POA: Insufficient documentation

## 2014-03-23 ENCOUNTER — Other Ambulatory Visit: Payer: Self-pay | Admitting: Adult Health

## 2014-03-24 ENCOUNTER — Other Ambulatory Visit: Payer: Self-pay | Admitting: Adult Health

## 2014-03-31 ENCOUNTER — Other Ambulatory Visit (HOSPITAL_COMMUNITY): Payer: Self-pay | Admitting: Orthopedic Surgery

## 2014-04-06 ENCOUNTER — Ambulatory Visit: Payer: Commercial Managed Care - HMO

## 2014-04-06 NOTE — Pre-Procedure Instructions (Signed)
Travis [name]  04/06/2014   Your procedure is scheduled on:  04/15/14  Report to [facility] cone short stay admitting at 55 AM.  Call this number if you have problems the morning of surgery: [phone]   Remember:   Do not eat food or drink liquids after midnight.   Take these medicines the morning of surgery with A SIP OF WATER: amlodipine,abilify,clonazepam,depakote,prozac,gabapentin,flomax,pain med if needed     Take all meds until day of surgery as ordered except as instructed below or per dr    No diabetic meds am of surgery   STOP all herbel meds, nsaids (aleve,naproxen,advil,ibuprofen) 5 days prior to surgery 04/09/14 including aspirin, vitamins fish oil,   effient per dr   Travis [name] not wear jewelry, make-up or nail polish.  Do not wear lotions, powders, or perfumes. You may wear deodorant.  Do not shave 48 hours prior to surgery. Men may shave face and neck.  Do not bring valuables to the hospital.  [facility] is not responsible                  for any belongings or valuables.               Contacts, dentures or bridgework may not be worn into surgery.  Leave suitcase in the car. After surgery it may be brought to your room.  For patients admitted to the hospital, discharge time is determined by your                treatment team.               Patients discharged the day of surgery will not be allowed to drive  home.  Name and phone number of your driver:   Special Instructions:  Special Instructions: [name] - Preparing for Surgery  Before surgery, you can play an important role.  Because skin is not sterile, your skin needs to be as free of germs as possible.  You can reduce the number of germs on you skin by washing with CHG (chlorahexidine gluconate) soap before surgery.  CHG is an antiseptic cleaner which kills germs and bonds with the skin to continue killing germs even after washing.  Please DO NOT use if you have an allergy to CHG or antibacterial soaps.  If your  skin becomes reddened/irritated stop using the CHG and inform your nurse when you arrive at Short Stay.  Do not shave (including legs and underarms) for at least 48 hours prior to the first CHG shower.  You may shave your face.  Please follow these instructions carefully:   1.  Shower with CHG Soap the night before surgery and the morning of Surgery.  2.  If you choose to wash your hair, wash your hair first as usual with your normal shampoo.  3.  After you shampoo, rinse your hair and body thoroughly to remove the Shampoo.  4.  Use CHG as you would any other liquid soap.  You can apply chg directly  to the skin and wash gently with scrungie or a clean washcloth.  5.  Apply the CHG Soap to your body ONLY FROM THE NECK DOWN.  Do not use on open wounds or open sores.  Avoid contact with your eyes ears, mouth and genitals (private parts).  Wash genitals (private parts)       with your normal soap.  6.  Wash thoroughly, paying special attention to the area where your surgery will be performed.  7.  Thoroughly rinse your body with warm water from the neck down.  8.  DO NOT shower/wash with your normal soap after using and rinsing off the CHG Soap.  9.  Pat yourself dry with a clean towel.            10.  Wear clean pajamas.            11.  Place clean sheets on your bed the night of your first shower and do not sleep with pets.  Day of Surgery  Do not apply any lotions/deodorants the morning of surgery.  Please wear clean clothes to the hospital/surgery center.   Please read over the following fact sheets that you were given: Pain Booklet, Coughing and Deep Breathing and Surgical Site Infection Prevention

## 2014-04-06 NOTE — Pre-Procedure Instructions (Signed)
Travis [name]  04/06/2014   Your procedure is scheduled on:  Wednesday, April 15, 2014 at 8:30 AM.   Report to [facility] Entrance "A" Admitting Office at 6:30 AM.   Call this number if you have problems the morning of surgery: [phone]                Any questions prior to day of surgery, please call [phone].   Remember:   Do not eat food or drink liquids after midnight Tuesday, 04/14/14.   Take these medicines the morning of surgery with A SIP OF WATER: amLODipine (NORVASC), ARIPiprazole (ABILIFY), divalproex (DEPAKOTE), FLUoxetine (PROZAC), gabapentin (NEURONTIN), isosorbide mononitrate (IMDUR),fluticasone (FLONASE), oxyCODONE (ROXICODONE) - if needed, clonazePAM (KLONOPIN) - if needed.  Stop Aspirin and Fish Oil as of today.  Follow physician's instructions for Effient use.   Do not wear jewelry.  Do not wear lotions, powders, or cologne. You may wear deodorant.  Men may shave face and neck.  Do not bring valuables to the hospital.  [facility] is not responsible                  for any belongings or valuables.               Contacts, dentures or bridgework may not be worn into surgery.  Leave suitcase in the car. After surgery it may be brought to your room.  For patients admitted to the hospital, discharge time is determined by your                treatment team.                               Special Instructions: [facility] - Preparing for Surgery  Before surgery, you can play an important role.  Because skin is not sterile, your skin needs to be as free of germs as possible.  You can reduce the number of germs on you skin by washing with CHG (chlorahexidine gluconate) soap before surgery.  CHG is an antiseptic cleaner which kills germs and bonds with the skin to continue killing germs even after washing.  Please DO NOT use if you have an allergy to CHG or antibacterial soaps.  If your skin becomes reddened/irritated stop using the CHG and inform your  nurse when you arrive at Short Stay.  Do not shave (including legs and underarms) for at least 48 hours prior to the first CHG shower.  You may shave your face.  Please follow these instructions carefully:   1.  Shower with CHG Soap the night before surgery and the                                morning of Surgery.  2.  If you choose to wash your hair, wash your hair first as usual with your       normal shampoo.  3.  After you shampoo, rinse your hair and body thoroughly to remove the                      Shampoo.  4.  Use CHG as you would any other liquid soap.  You can apply chg directly       to the skin and wash gently with scrungie or a clean washcloth.  5.  Apply the CHG Soap to your  body ONLY FROM THE NECK DOWN.        Do not use on open wounds or open sores.  Avoid contact with your eyes, ears, mouth and genitals (private parts).  Wash genitals (private parts) with your normal soap.  6.  Wash thoroughly, paying special attention to the area where your surgery        will be performed.  7.  Thoroughly rinse your body with warm water from the neck down.  8.  DO NOT shower/wash with your normal soap after using and rinsing off       the CHG Soap.  9.  Pat yourself dry with a clean towel.            10.  Wear clean pajamas.            11.  Place clean sheets on your bed the night of your first shower and do not        sleep with pets.  Day of Surgery  Do not apply any lotions the morning of surgery.  Please wear clean clothes to the hospital.     Please read over the following fact sheets that you were given: Pain Booklet, Coughing and Deep Breathing and Surgical Site Infection Prevention

## 2014-04-07 ENCOUNTER — Encounter (HOSPITAL_COMMUNITY): Payer: Self-pay

## 2014-04-07 ENCOUNTER — Encounter (HOSPITAL_COMMUNITY)
Admission: RE | Admit: 2014-04-07 | Discharge: 2014-04-07 | Disposition: A | Discharge status: Home / Self Care | Payer: Commercial Managed Care - HMO | Source: Ambulatory Visit | Attending: Orthopedic Surgery | Admitting: Orthopedic Surgery

## 2014-04-07 DIAGNOSIS — Z01812 Encounter for preprocedural laboratory examination: Secondary | ICD-10-CM | POA: Diagnosis present

## 2014-04-07 HISTORY — DX: Cardiac murmur, unspecified: R01.1

## 2014-04-07 LAB — COMPREHENSIVE METABOLIC PANEL
ALBUMIN: 3.9 g/dL (ref 3.5–5.2)
ALT: 7 U/L (ref 0–53)
AST: 13 U/L (ref 0–37)
Alkaline Phosphatase: 55 U/L (ref 39–117)
Anion gap: 12 (ref 5–15)
BUN: 12 mg/dL (ref 6–23)
CO2: 26 meq/L (ref 19–32)
Calcium: 9.2 mg/dL (ref 8.4–10.5)
Chloride: 101 mEq/L (ref 96–112)
Creatinine, Ser: 0.88 mg/dL (ref 0.50–1.35)
GFR calc Af Amer: >90 mL/min (ref >90)
GLUCOSE: 84 mg/dL (ref 70–99)
POTASSIUM: 4.4 meq/L (ref 3.7–5.3)
Sodium: 139 mEq/L (ref 137–147)
Total Bilirubin: 0.4 mg/dL (ref 0.3–1.2)
Total Protein: 6.7 g/dL (ref 6.0–8.3)

## 2014-04-07 LAB — CBC
HCT: 37.1 % — ABNORMAL LOW (ref 39.0–52.0)
Hemoglobin: 12.6 g/dL — ABNORMAL LOW (ref 13.0–17.0)
MCH: 30.7 pg (ref 26.0–34.0)
MCHC: 34 g/dL (ref 30.0–36.0)
MCV: 90.5 fL (ref 78.0–100.0)
Platelets: 154 10*3/uL (ref 150–400)
RBC: 4.1 MIL/uL — ABNORMAL LOW (ref 4.22–5.81)
RDW: 13.2 % (ref 11.5–15.5)
WBC: 3.7 10*3/uL — AB (ref 4.0–10.5)

## 2014-04-07 NOTE — Progress Notes (Addendum)
Pt has a cardiac history, sees Dr. Aundra Dubin once a year. Had a cath 01/2013 that was "clean". Last visit with Dr. Aundra Dubin was last October, 2014. Pt denies any chest pain or sob in past year. Found a "cardiac clearance" in telephone notes for September surgery.   He states he was told to stop Effient 5 days prior to surgery.  No longer uses C-pap due to the fact he has lost 140+ lbs in past 2 years.

## 2014-04-09 ENCOUNTER — Ambulatory Visit: Payer: Commercial Managed Care - HMO

## 2014-04-13 ENCOUNTER — Ambulatory Visit: Payer: Commercial Managed Care - HMO | Attending: Specialist | Admitting: Rehabilitation

## 2014-04-13 DIAGNOSIS — M48061 Spinal stenosis, lumbar region without neurogenic claudication: Secondary | ICD-10-CM

## 2014-04-13 DIAGNOSIS — M6281 Muscle weakness (generalized): Secondary | ICD-10-CM

## 2014-04-13 DIAGNOSIS — M4806 Spinal stenosis, lumbar region: Secondary | ICD-10-CM | POA: Diagnosis present

## 2014-04-13 DIAGNOSIS — R262 Difficulty in walking, not elsewhere classified: Secondary | ICD-10-CM | POA: Diagnosis not present

## 2014-04-13 DIAGNOSIS — R293 Abnormal posture: Secondary | ICD-10-CM | POA: Diagnosis not present

## 2014-04-13 NOTE — Patient Instructions (Signed)
ABDUCTION: Sitting - Resistance Band (Active)   Sit with feet flat. Lift left leg slightly and, against yellow resistance band, draw it out to side. Complete _2__ sets of 10___ repetitions. Perform _2__ sessions per day.  Copyright  VHI. All rights reserved.  Internal Rotation: Isometric (Sitting)  Sit at table, push outside of left foot into table leg, hold 5 seconds, repeat 2 sets of 10 reps, 2 times per day.   Copyright  VHI. All rights reserved.

## 2014-04-13 NOTE — Therapy (Signed)
Physical Therapy Treatment  Patient Details  Name: Travis [name] MRN: [id number] Date of Birth: 10-23-1950  Encounter Date: 04/13/2014      PT End of Session - 04/13/14 0754    Visit Number 2   Number of Visits 16   Date for PT Re-Evaluation 05/15/14   PT Start Time 0735   PT Stop Time 0800   PT Time Calculation (min) 25 min      Past Medical History  Diagnosis Date  . Impaired glucose tolerance 09/27/2010  . HYPERLIPIDEMIA-MIXED 07/15/2009  . Chronic pain syndrome 10/27/2009  . HYPERTENSION 06/24/2009  . CORONARY ARTERY DISEASE 06/24/2009    a. s/p multiple PCIs - In 2008 he had a Taxus DES to the mild LAD, Endeavor DES to mid LCX and distal LCX. In January 2009 he had DES to distal LCX, mid LCX and proximal LCX. In November 2009 had BMS x 2 to the mid RCA. Cath 10/2011 with patent stents, noncardiac CP. LHC 01/2013: patent stents (noncardiac CP).  [name] URETHRAL STRICTURE 06/24/2009  . SHOULDER PAIN, BILATERAL 06/24/2009  . ANKLE PAIN, RIGHT 06/24/2009  . SCIATICA, LEFT 04/19/2010  . NEPHROLITHIASIS, HX OF 06/24/2009  . ERECTILE DYSFUNCTION, ORGANIC 05/30/2010  . Obesity   . ALLERGIC RHINITIS 06/24/2009  . Anxiety   . Depression     Prior suicide attempt  . Irregular heart beat   . Pneumonia   . Self-catheterizes urinary bladder   . Anginal pain   . Type II diabetes mellitus   . Hepatitis C dx'd 1979  . DEGENERATIVE JOINT DISEASE 06/24/2009  . [place] DISEASE, LUMBAR 04/19/2010  . Arthritis     "all my joints" (09/30/2013)  . Fibromyalgia     "left leg" (09/30/2013)  . Chronic back pain     "my whole back" (09/30/2013)  . Kidney stones     "passed on my own"  . Myocardial infarction 2008  . OSA (obstructive sleep apnea)     not using CPAP (09/30/2013)  . Heart murmur     Past Surgical History  Procedure Laterality Date  . Shoulder open rotator cuff repair Right X 2  . Total knee arthroplasty Bilateral 2008  . Carpal tunnel release Bilateral   . Urethral dilation  X 4  . Knee  arthroscopy Right X 7  . Umbilical hernia repair      UHR  . Hernia repair      umbilical  . Shoulder arthroscopy w/ rotator cuff repair Bilateral     "3on the left; 2 on the right"  . Tonsillectomy    . Knee cartilage surgery Right X 4    "open; not scopes"  . Knee cartilage surgery Left X 3    "open; not scopes"  . Coronary angioplasty with stent placement      "I have 9 stents"  . Cardiac catheterization  X 1  . Anterior cervical decomp/discectomy fusion N/A 09/26/2013    Procedure: ANTERIOR CERVICAL DISCECTOMY FUSION C3-4, plate and screw fixation, allograft bone graft;  Surgeon: Jessy Oto, MD;  Location: [place];  Service: Orthopedics;  Laterality: N/A;  . Laminectomy  01/28/2014    L3 L4 L5  . Lumbar laminectomy/decompression microdiscectomy N/A 01/27/2014    Procedure: CENTRAL LUMBAR LAMINECTOMY L4-5 AND L3-4;  Surgeon: Jessy Oto, MD;  Location: [place];  Service: Orthopedics;  Laterality: N/A;  . Joint replacement Bilateral     knees  . Colonoscopy    . Vasectomy      There  were no vitals taken for this visit.  Visit Diagnosis:  Stenosis, spinal, lumbar  Muscle weakness  Difficulty walking      Subjective Assessment - 04/13/14 0741    Symptoms Pt reports overall pain has decreased. No longer constant, more intermittant now. Continued pain left lateral thigh to the knee which is worse with laying on left side. He reports walking no longer increases his pain   Currently in Pain? Yes   Pain Score 5    Pain Location Back   Pain Orientation Mid;Lower   Pain Descriptors / Indicators Aching   Aggravating Factors  Laying on left side, prolonged standing walking    Pain Relieving Factors Resting           [facility] PT Assessment - 04/13/14 0745    Strength   Left Hip Internal Rotation  --  2+/5 to 3-/5   Standardized Balance Assessment   Standardized Balance Assessment Berg Balance Test  50/56   Berg Balance Test   Sit to Stand Able to stand without using hands  and stabilize independently   Standing Unsupported Able to stand safely 2 minutes   Sitting with Back Unsupported but Feet Supported on Floor or Stool Able to sit safely and securely 2 minutes   Stand to Sit Sits safely with minimal use of hands   Transfers Able to transfer safely, minor use of hands   Standing Unsupported with Eyes Closed Able to stand 10 seconds safely   Standing Ubsupported with Feet Together Able to place feet together independently and stand 1 minute safely   From Standing, Reach Forward with Outstretched Arm Can reach confidently >25 cm (10")   From Standing Position, Pick up Object from Floor Able to pick up shoe safely and easily   From Standing Position, Turn to Look Behind Over each Shoulder Looks behind from both sides and weight shifts well   Turn 360 Degrees Able to turn 360 degrees safely one side only in 4 seconds or less   Standing Unsupported, Alternately Place Feet on Step/Stool Able to complete >2 steps/needs minimal assist   Standing Unsupported, One Foot in Front Able to place foot tandem independently and hold 30 seconds   Standing on One Leg Able to lift leg independently and hold equal to or more than 3 seconds          [facility] Adult PT Treatment/Exercise - 04/13/14 0735    Knee/Hip Exercises: Seated   Other Seated Knee Exercises --  Isometric hip IR into table leg 5sec x10   Other Seated Knee Exercises --  Clam with green band x20   Knee/Hip Exercises: Supine   Straight Leg Raises Strengthening;10 reps;Left   Knee/Hip Exercises: Sidelying   Clams --  10x2, attempted with yellow band, unable   Ankle Exercises: Aerobic   Stationary Bike Nustep  Level 5 LE only x 5 min            PT Short Term Goals - 04/13/14 1255    PT SHORT TERM GOAL #1   Title Be independent with initial HEP   Time 4   Period Weeks   Status Achieved   PT SHORT TERM GOAL #2   Title Report pain decrease 20%   Time 4   Period Weeks   Status Achieved   PT SHORT  TERM GOAL #3   Title Improve Rt thigh strength to 4/5   Time 4   Period Weeks   Status On-going   PT SHORT TERM  GOAL #4   Title report 20% improved tolerance for doing home tasks on feet   Time 4   Period Weeks   Status Achieved          PT Long Term Goals - 04/13/14 1257    PT LONG TERM GOAL #1   Title demonstrate and or verbalize techniques to reduce the risk of re-injury to include info on: posture and body mechanics   Time 8   Period Weeks   Status On-going   PT LONG TERM GOAL #2   Title Be independent with advanced HEP   Time 8   Period Weeks   Status On-going   PT LONG TERM GOAL #3   Title report pain decrease 50% improved tolerance of activity on feet   Time 8   Period Weeks   Status On-going   PT LONG TERM GOAL #4   Title report able to walk without device safely in home   Time 8   Period Weeks   Status On-going   PT LONG TERM GOAL #5   Title improve BERG SCORE to _/56 to demo improved balance and safety with walking   Time 8   Period Weeks   Status On-going          Plan - 04/13/14 0755    Clinical Impression Statement STG# 1 met, #2 met, #3 met. BERG 50/56. Pt having right foot surgery this week. Will call when released to return to P.T.   PT Next Visit Plan Continue Strengthening LLE        Problem List Patient Active Problem List   Diagnosis Date Noted  . Postoperative anemia due to acute blood loss 01/30/2014    Class: Acute  . Hypotension, unspecified 01/30/2014    Class: Acute  . Spinal stenosis, lumbar 01/27/2014  . Cellulitis of leg, right 10/15/2013  . Headache(784.0) 10/15/2013  . Type I (juvenile type) diabetes mellitus with peripheral circulatory disorders, uncontrolled(250.73) 10/04/2013  . Acute posthemorrhagic anemia 10/04/2013  . Acute urinary retention 09/30/2013    Class: Acute  . Spinal stenosis in cervical region 09/26/2013    Class: Chronic  . Spinal stenosis, lumbar region, with neurogenic claudication 09/26/2013     Class: Chronic  . Cervical stenosis of spinal canal 09/26/2013  . Neck pain 09/02/2013  . Hand joint pain 06/10/2013  . Rotator cuff tear arthropathy of right shoulder 06/10/2013  . Skin lesion of cheek 05/01/2013  . Pain of right thumb 04/03/2013  . Balance disorder 03/12/2013  . Gait disorder 03/12/2013  . Tremor 03/12/2013  . Left hip pain 03/12/2013  . Low back pain 03/12/2013  . Pre-ulcerative corn or callous 02/06/2013  . Polypharmacy 06/25/2012    Class: Chronic  . Balance problems 12/25/2011  . Anxiety 11/12/2011  . OSA (obstructive sleep apnea) 11/07/2011  . Bradycardia 10/20/2011  . Insomnia 10/04/2011  . Lumbar degenerative disc disease 07/11/2011  . Obesity 01/12/2011  . Preventative health care 09/27/2010  . ERECTILE DYSFUNCTION, ORGANIC 05/30/2010  . [place] DISEASE, LUMBAR 04/19/2010  . SCIATICA, LEFT 04/19/2010  . Chronic pain syndrome 10/27/2009  . HYPERLIPIDEMIA-MIXED 07/15/2009  . DEPRESSION 06/24/2009  . HYPERTENSION 06/24/2009  . CORONARY ARTERY DISEASE 06/24/2009  . ALLERGIC RHINITIS 06/24/2009  . URETHRAL STRICTURE 06/24/2009  . DEGENERATIVE JOINT DISEASE 06/24/2009  . SHOULDER PAIN, BILATERAL 06/24/2009  . FATIGUE 06/24/2009  . NEPHROLITHIASIS, HX OF 06/24/2009  Dorene Ar, PTA 04/13/2014, 1:08 PM

## 2014-04-14 MED ORDER — DEXTROSE 5 % IV SOLN
3.0000 g | INTRAVENOUS | Status: AC
  Administered 2014-04-15: 3 g via INTRAVENOUS
  Filled 2014-04-14: qty 3000

## 2014-04-15 ENCOUNTER — Ambulatory Visit (HOSPITAL_COMMUNITY): Payer: Commercial Managed Care - HMO | Admitting: Anesthesiology

## 2014-04-15 ENCOUNTER — Ambulatory Visit (HOSPITAL_COMMUNITY)
Admission: RE | Admit: 2014-04-15 | Discharge: 2014-04-17 | Disposition: A | Discharge status: Skilled Nursing Facility | Payer: Commercial Managed Care - HMO | Source: Ambulatory Visit | Attending: Orthopedic Surgery | Admitting: Orthopedic Surgery

## 2014-04-15 ENCOUNTER — Encounter (HOSPITAL_COMMUNITY)
Admission: RE | Disposition: A | Discharge status: Skilled Nursing Facility | Payer: Self-pay | Source: Ambulatory Visit | Attending: Orthopedic Surgery

## 2014-04-15 ENCOUNTER — Ambulatory Visit (HOSPITAL_COMMUNITY): Payer: Self-pay | Admitting: Psychiatry

## 2014-04-15 ENCOUNTER — Encounter (HOSPITAL_COMMUNITY): Payer: Self-pay | Admitting: *Deleted

## 2014-04-15 DIAGNOSIS — Z79899 Other long term (current) drug therapy: Secondary | ICD-10-CM | POA: Diagnosis not present

## 2014-04-15 DIAGNOSIS — I10 Essential (primary) hypertension: Secondary | ICD-10-CM | POA: Diagnosis not present

## 2014-04-15 DIAGNOSIS — B1920 Unspecified viral hepatitis C without hepatic coma: Secondary | ICD-10-CM | POA: Insufficient documentation

## 2014-04-15 DIAGNOSIS — G4733 Obstructive sleep apnea (adult) (pediatric): Secondary | ICD-10-CM | POA: Diagnosis not present

## 2014-04-15 DIAGNOSIS — Z7982 Long term (current) use of aspirin: Secondary | ICD-10-CM | POA: Diagnosis not present

## 2014-04-15 DIAGNOSIS — E785 Hyperlipidemia, unspecified: Secondary | ICD-10-CM | POA: Insufficient documentation

## 2014-04-15 DIAGNOSIS — D649 Anemia, unspecified: Secondary | ICD-10-CM | POA: Diagnosis not present

## 2014-04-15 DIAGNOSIS — G894 Chronic pain syndrome: Secondary | ICD-10-CM | POA: Diagnosis not present

## 2014-04-15 DIAGNOSIS — G8929 Other chronic pain: Secondary | ICD-10-CM | POA: Insufficient documentation

## 2014-04-15 DIAGNOSIS — M12571 Traumatic arthropathy, right ankle and foot: Secondary | ICD-10-CM | POA: Insufficient documentation

## 2014-04-15 DIAGNOSIS — I252 Old myocardial infarction: Secondary | ICD-10-CM | POA: Insufficient documentation

## 2014-04-15 DIAGNOSIS — E669 Obesity, unspecified: Secondary | ICD-10-CM | POA: Diagnosis not present

## 2014-04-15 DIAGNOSIS — N521 Erectile dysfunction due to diseases classified elsewhere: Secondary | ICD-10-CM | POA: Diagnosis not present

## 2014-04-15 DIAGNOSIS — I2510 Atherosclerotic heart disease of native coronary artery without angina pectoris: Secondary | ICD-10-CM | POA: Insufficient documentation

## 2014-04-15 DIAGNOSIS — F329 Major depressive disorder, single episode, unspecified: Secondary | ICD-10-CM | POA: Diagnosis not present

## 2014-04-15 DIAGNOSIS — M19071 Primary osteoarthritis, right ankle and foot: Secondary | ICD-10-CM | POA: Diagnosis present

## 2014-04-15 DIAGNOSIS — F419 Anxiety disorder, unspecified: Secondary | ICD-10-CM | POA: Insufficient documentation

## 2014-04-15 DIAGNOSIS — M797 Fibromyalgia: Secondary | ICD-10-CM | POA: Diagnosis not present

## 2014-04-15 DIAGNOSIS — M549 Dorsalgia, unspecified: Secondary | ICD-10-CM | POA: Insufficient documentation

## 2014-04-15 DIAGNOSIS — E119 Type 2 diabetes mellitus without complications: Secondary | ICD-10-CM | POA: Diagnosis not present

## 2014-04-15 DIAGNOSIS — Z87891 Personal history of nicotine dependence: Secondary | ICD-10-CM | POA: Diagnosis not present

## 2014-04-15 HISTORY — PX: FUSION OF TALONAVICULAR JOINT: SHX6332

## 2014-04-15 HISTORY — PX: ANKLE FUSION: SHX5718

## 2014-04-15 HISTORY — DX: Primary osteoarthritis, right ankle and foot: M19.071

## 2014-04-15 LAB — GLUCOSE, CAPILLARY
GLUCOSE-CAPILLARY: 98 mg/dL (ref 70–99)
GLUCOSE-CAPILLARY: 114 mg/dL — AB (ref 70–99)
Glucose-Capillary: 105 mg/dL — ABNORMAL HIGH (ref 70–99)
Glucose-Capillary: 93 mg/dL (ref 70–99)
Glucose-Capillary: 110 mg/dL — ABNORMAL HIGH (ref 70–99)

## 2014-04-15 LAB — APTT: APTT: 31 s (ref 24–37)

## 2014-04-15 LAB — PROTIME-INR
INR: 1.18 (ref 0.00–1.49)
Prothrombin Time: 15.2 seconds (ref 11.6–15.2)

## 2014-04-15 SURGERY — ANKLE FUSION
Anesthesia: Monitor Anesthesia Care | Site: Ankle | Laterality: Right

## 2014-04-15 MED ORDER — CYCLOBENZAPRINE HCL 5 MG PO TABS
5.0000 mg | ORAL_TABLET | Freq: Three times a day (TID) | ORAL | Status: DC | PRN
  Filled 2014-04-15: qty 1

## 2014-04-15 MED ORDER — POLYETHYLENE GLYCOL 3350 17 G PO PACK
17.0000 g | PACK | Freq: Every day | ORAL | Status: DC
  Administered 2014-04-16 – 2014-04-17 (×2): 17 g via ORAL
  Filled 2014-04-15 (×3): qty 1

## 2014-04-15 MED ORDER — ONDANSETRON HCL 4 MG/2ML IJ SOLN: 4.0000 mg | Freq: Four times a day (QID) | INTRAMUSCULAR | Status: DC | PRN

## 2014-04-15 MED ORDER — GLYCOPYRROLATE 0.2 MG/ML IJ SOLN
INTRAMUSCULAR | Status: DC | PRN
  Administered 2014-04-15: 0.2 mg via INTRAVENOUS

## 2014-04-15 MED ORDER — PROPOFOL 10 MG/ML IV BOLUS
INTRAVENOUS | Status: AC
  Filled 2014-04-15: qty 20

## 2014-04-15 MED ORDER — LIDOCAINE HCL (PF) 2 % IJ SOLN
INTRAMUSCULAR | Status: DC | PRN
  Administered 2014-04-15: 80 mg via INTRADERMAL

## 2014-04-15 MED ORDER — FLUTICASONE PROPIONATE 50 MCG/ACT NA SUSP
1.0000 | Freq: Every day | NASAL | Status: DC
  Administered 2014-04-16 – 2014-04-17 (×2): 1 via NASAL
  Filled 2014-04-15: qty 16

## 2014-04-15 MED ORDER — LACTATED RINGERS IV SOLN
INTRAVENOUS | Status: DC | PRN
  Administered 2014-04-15 (×2): via INTRAVENOUS

## 2014-04-15 MED ORDER — BENAZEPRIL HCL 20 MG PO TABS
20.0000 mg | ORAL_TABLET | Freq: Every day | ORAL | Status: DC
  Administered 2014-04-15 – 2014-04-17 (×3): 20 mg via ORAL
  Filled 2014-04-15 (×3): qty 1

## 2014-04-15 MED ORDER — LIDOCAINE HCL (CARDIAC) 20 MG/ML IV SOLN
INTRAVENOUS | Status: AC
  Filled 2014-04-15: qty 5

## 2014-04-15 MED ORDER — AMLODIPINE BESYLATE 5 MG PO TABS
5.0000 mg | ORAL_TABLET | Freq: Every day | ORAL | Status: DC
  Administered 2014-04-15 – 2014-04-17 (×3): 5 mg via ORAL
  Filled 2014-04-15 (×3): qty 1

## 2014-04-15 MED ORDER — MIDAZOLAM HCL 2 MG/2ML IJ SOLN
INTRAMUSCULAR | Status: AC
  Filled 2014-04-15: qty 2

## 2014-04-15 MED ORDER — OXYCODONE-ACETAMINOPHEN 5-325 MG PO TABS: 1.0000 | ORAL_TABLET | ORAL | Status: DC | PRN

## 2014-04-15 MED ORDER — FENTANYL CITRATE 0.05 MG/ML IJ SOLN
INTRAMUSCULAR | Status: DC | PRN
  Administered 2014-04-15 (×2): 50 ug via INTRAVENOUS

## 2014-04-15 MED ORDER — DIVALPROEX SODIUM 250 MG PO DR TAB
250.0000 mg | DELAYED_RELEASE_TABLET | Freq: Two times a day (BID) | ORAL | Status: DC
  Administered 2014-04-15: 250 mg via ORAL

## 2014-04-15 MED ORDER — ATORVASTATIN CALCIUM 20 MG PO TABS
20.0000 mg | ORAL_TABLET | Freq: Every day | ORAL | Status: DC
  Administered 2014-04-15 – 2014-04-16 (×2): 20 mg via ORAL
  Filled 2014-04-15 (×3): qty 1

## 2014-04-15 MED ORDER — FLUOXETINE HCL 20 MG PO CAPS
40.0000 mg | ORAL_CAPSULE | Freq: Every day | ORAL | Status: DC
  Administered 2014-04-15 – 2014-04-17 (×3): 40 mg via ORAL
  Filled 2014-04-15 (×3): qty 2

## 2014-04-15 MED ORDER — ISOSORBIDE MONONITRATE ER 60 MG PO TB24
60.0000 mg | ORAL_TABLET | Freq: Every day | ORAL | Status: DC
  Administered 2014-04-15 – 2014-04-17 (×3): 60 mg via ORAL
  Filled 2014-04-15 (×3): qty 1

## 2014-04-15 MED ORDER — ROCURONIUM BROMIDE 50 MG/5ML IV SOLN
INTRAVENOUS | Status: AC
  Filled 2014-04-15: qty 1

## 2014-04-15 MED ORDER — HYDROMORPHONE HCL 1 MG/ML IJ SOLN
0.5000 mg | INTRAMUSCULAR | Status: DC | PRN
  Administered 2014-04-16 – 2014-04-17 (×4): 1 mg via INTRAVENOUS
  Filled 2014-04-15 (×4): qty 1

## 2014-04-15 MED ORDER — ROPIVACAINE HCL 5 MG/ML IJ SOLN
INTRAMUSCULAR | Status: DC | PRN
  Administered 2014-04-15: 30 mL via PERINEURAL
  Administered 2014-04-15: 15 mL via PERINEURAL

## 2014-04-15 MED ORDER — METHOCARBAMOL 1000 MG/10ML IJ SOLN
500.0000 mg | Freq: Four times a day (QID) | INTRAVENOUS | Status: DC | PRN
  Filled 2014-04-15: qty 5

## 2014-04-15 MED ORDER — ONDANSETRON HCL 4 MG PO TABS: 4.0000 mg | ORAL_TABLET | Freq: Four times a day (QID) | ORAL | Status: DC | PRN

## 2014-04-15 MED ORDER — DIVALPROEX SODIUM 250 MG PO DR TAB
250.0000 mg | DELAYED_RELEASE_TABLET | Freq: Every day | ORAL | Status: DC
  Administered 2014-04-15 – 2014-04-17 (×3): 250 mg via ORAL
  Filled 2014-04-15 (×3): qty 1

## 2014-04-15 MED ORDER — 0.9 % SODIUM CHLORIDE (POUR BTL) OPTIME
TOPICAL | Status: DC | PRN
  Administered 2014-04-15: 1000 mL

## 2014-04-15 MED ORDER — ARTIFICIAL TEARS OP OINT
TOPICAL_OINTMENT | OPHTHALMIC | Status: AC
  Filled 2014-04-15: qty 3.5

## 2014-04-15 MED ORDER — DIVALPROEX SODIUM 500 MG PO DR TAB
500.0000 mg | DELAYED_RELEASE_TABLET | Freq: Every day | ORAL | Status: DC
  Administered 2014-04-15 – 2014-04-16 (×2): 500 mg via ORAL
  Filled 2014-04-15 (×3): qty 1

## 2014-04-15 MED ORDER — CLONAZEPAM 1 MG PO TABS
2.0000 mg | ORAL_TABLET | Freq: Two times a day (BID) | ORAL | Status: DC
  Administered 2014-04-15 – 2014-04-16 (×3): 2 mg via ORAL
  Filled 2014-04-15 (×4): qty 2

## 2014-04-15 MED ORDER — INFLUENZA VAC SPLIT QUAD 0.5 ML IM SUSY
0.5000 mL | PREFILLED_SYRINGE | INTRAMUSCULAR | Status: AC
  Administered 2014-04-16: 0.5 mL via INTRAMUSCULAR
  Filled 2014-04-15: qty 0.5

## 2014-04-15 MED ORDER — HYDROMORPHONE HCL 1 MG/ML IJ SOLN: 0.2500 mg | INTRAMUSCULAR | Status: DC | PRN

## 2014-04-15 MED ORDER — MIDAZOLAM HCL 5 MG/5ML IJ SOLN
INTRAMUSCULAR | Status: DC | PRN
  Administered 2014-04-15: 2 mg via INTRAVENOUS

## 2014-04-15 MED ORDER — SUCCINYLCHOLINE CHLORIDE 20 MG/ML IJ SOLN
INTRAMUSCULAR | Status: AC
  Filled 2014-04-15: qty 1

## 2014-04-15 MED ORDER — GABAPENTIN 400 MG PO CAPS
800.0000 mg | ORAL_CAPSULE | Freq: Four times a day (QID) | ORAL | Status: DC
  Administered 2014-04-15 – 2014-04-17 (×8): 800 mg via ORAL
  Filled 2014-04-15 (×11): qty 2

## 2014-04-15 MED ORDER — FENTANYL CITRATE 0.05 MG/ML IJ SOLN
INTRAMUSCULAR | Status: AC
  Filled 2014-04-15: qty 5

## 2014-04-15 MED ORDER — METOCLOPRAMIDE HCL 5 MG/ML IJ SOLN
INTRAMUSCULAR | Status: DC | PRN
  Administered 2014-04-15: 10 mg via INTRAVENOUS

## 2014-04-15 MED ORDER — OXYCODONE HCL 5 MG/5ML PO SOLN: 5.0000 mg | Freq: Once | ORAL | Status: DC | PRN

## 2014-04-15 MED ORDER — PROPOFOL 10 MG/ML IV BOLUS
INTRAVENOUS | Status: DC | PRN
  Administered 2014-04-15: 180 mg via INTRAVENOUS

## 2014-04-15 MED ORDER — EPHEDRINE SULFATE 50 MG/ML IJ SOLN
INTRAMUSCULAR | Status: DC | PRN
  Administered 2014-04-15: 10 mg via INTRAVENOUS
  Administered 2014-04-15: 5 mg via INTRAVENOUS
  Administered 2014-04-15: 25 mg via INTRAVENOUS

## 2014-04-15 MED ORDER — ONDANSETRON HCL 4 MG/2ML IJ SOLN
INTRAMUSCULAR | Status: AC
  Filled 2014-04-15: qty 2

## 2014-04-15 MED ORDER — DOCUSATE SODIUM 100 MG PO CAPS
100.0000 mg | ORAL_CAPSULE | Freq: Two times a day (BID) | ORAL | Status: DC
  Administered 2014-04-15 – 2014-04-17 (×4): 100 mg via ORAL
  Filled 2014-04-15 (×6): qty 1

## 2014-04-15 MED ORDER — OXYCODONE HCL 5 MG PO TABS
15.0000 mg | ORAL_TABLET | ORAL | Status: DC | PRN
  Administered 2014-04-15 – 2014-04-17 (×10): 15 mg via ORAL
  Filled 2014-04-15 (×10): qty 3

## 2014-04-15 MED ORDER — OXYCODONE HCL 5 MG PO TABS: 5.0000 mg | ORAL_TABLET | Freq: Once | ORAL | Status: DC | PRN

## 2014-04-15 MED ORDER — ASPIRIN 81 MG PO TABS: 81.0000 mg | ORAL_TABLET | Freq: Every day | ORAL | Status: DC

## 2014-04-15 MED ORDER — ARIPIPRAZOLE 5 MG PO TABS
5.0000 mg | ORAL_TABLET | Freq: Every day | ORAL | Status: DC
  Administered 2014-04-15 – 2014-04-17 (×3): 5 mg via ORAL
  Filled 2014-04-15 (×3): qty 1

## 2014-04-15 MED ORDER — CEFAZOLIN SODIUM-DEXTROSE 2-3 GM-% IV SOLR
2.0000 g | Freq: Four times a day (QID) | INTRAVENOUS | Status: AC
  Administered 2014-04-15 – 2014-04-16 (×3): 2 g via INTRAVENOUS
  Filled 2014-04-15 (×3): qty 50

## 2014-04-15 MED ORDER — PHENYLEPHRINE HCL 10 MG/ML IJ SOLN
INTRAMUSCULAR | Status: DC | PRN
  Administered 2014-04-15 (×3): 80 ug via INTRAVENOUS
  Administered 2014-04-15: 40 ug via INTRAVENOUS

## 2014-04-15 MED ORDER — METFORMIN HCL 500 MG PO TABS
500.0000 mg | ORAL_TABLET | Freq: Two times a day (BID) | ORAL | Status: DC
  Administered 2014-04-15 – 2014-04-17 (×4): 500 mg via ORAL
  Filled 2014-04-15 (×6): qty 1

## 2014-04-15 MED ORDER — METOCLOPRAMIDE HCL 5 MG/ML IJ SOLN: 5.0000 mg | Freq: Three times a day (TID) | INTRAMUSCULAR | Status: DC | PRN

## 2014-04-15 MED ORDER — ASPIRIN 81 MG PO CHEW
81.0000 mg | CHEWABLE_TABLET | Freq: Every day | ORAL | Status: DC
  Administered 2014-04-16 – 2014-04-17 (×2): 81 mg via ORAL
  Filled 2014-04-15 (×3): qty 1

## 2014-04-15 MED ORDER — TRAZODONE HCL 100 MG PO TABS
100.0000 mg | ORAL_TABLET | Freq: Every day | ORAL | Status: DC
  Administered 2014-04-15 – 2014-04-16 (×2): 100 mg via ORAL
  Filled 2014-04-15 (×3): qty 1

## 2014-04-15 MED ORDER — SODIUM CHLORIDE 0.9 % IV SOLN
INTRAVENOUS | Status: DC
  Administered 2014-04-15: 15:00:00 via INTRAVENOUS

## 2014-04-15 MED ORDER — ONDANSETRON HCL 4 MG/2ML IJ SOLN
INTRAMUSCULAR | Status: DC | PRN
  Administered 2014-04-15: 4 mg via INTRAVENOUS

## 2014-04-15 MED ORDER — METHOCARBAMOL 500 MG PO TABS
500.0000 mg | ORAL_TABLET | Freq: Four times a day (QID) | ORAL | Status: DC | PRN
  Administered 2014-04-15 – 2014-04-17 (×5): 500 mg via ORAL
  Filled 2014-04-15 (×5): qty 1

## 2014-04-15 MED ORDER — METOCLOPRAMIDE HCL 10 MG PO TABS: 5.0000 mg | ORAL_TABLET | Freq: Three times a day (TID) | ORAL | Status: DC | PRN

## 2014-04-15 MED ORDER — TAMSULOSIN HCL 0.4 MG PO CAPS
0.4000 mg | ORAL_CAPSULE | Freq: Every day | ORAL | Status: DC
  Administered 2014-04-15 – 2014-04-17 (×3): 0.4 mg via ORAL
  Filled 2014-04-15 (×5): qty 1

## 2014-04-15 SURGICAL SUPPLY — 47 items
BANDAGE ESMARK 6X9 LF (GAUZE/BANDAGES/DRESSINGS) ×1 IMPLANT
BINDER ABD UNIV 10 28-50 (GAUZE/BANDAGES/DRESSINGS) IMPLANT
BINDER ABDOM UNIV 10 (GAUZE/BANDAGES/DRESSINGS) ×4
BIT DRILL CANN 3.2 (BIT) IMPLANT
BIT DRILL CANN LRG QC 5X300 (BIT) ×1 IMPLANT
BLADE SAW SGTL HD 18.5X60.5X1. (BLADE) ×2 IMPLANT
BLADE SURG 10 STRL SS (BLADE) IMPLANT
BNDG CMPR 9X6 STRL LF SNTH (GAUZE/BANDAGES/DRESSINGS) ×1
BNDG COHESIVE 4X5 TAN STRL (GAUZE/BANDAGES/DRESSINGS) ×2 IMPLANT
BNDG ESMARK 6X9 LF (GAUZE/BANDAGES/DRESSINGS) ×2
BNDG GAUZE ELAST 4 BULKY (GAUZE/BANDAGES/DRESSINGS) ×3 IMPLANT
COVER MAYO STAND STRL (DRAPES) IMPLANT
COVER SURGICAL LIGHT HANDLE (MISCELLANEOUS) ×2 IMPLANT
DRAPE INCISE IOBAN 66X45 STRL (DRAPES) ×2 IMPLANT
DRAPE OEC MINIVIEW 54X84 (DRAPES) IMPLANT
DRAPE U-SHAPE 47X51 STRL (DRAPES) ×2 IMPLANT
DRILL BIT CANN 3.2 (BIT) ×2
DRSG ADAPTIC 3X8 NADH LF (GAUZE/BANDAGES/DRESSINGS) ×2 IMPLANT
DURAPREP 26ML APPLICATOR (WOUND CARE) ×2 IMPLANT
ELECT REM PT RETURN 9FT ADLT (ELECTROSURGICAL) ×2
ELECTRODE REM PT RTRN 9FT ADLT (ELECTROSURGICAL) ×1 IMPLANT
GAUZE SPONGE 4X4 12PLY STRL (GAUZE/BANDAGES/DRESSINGS) ×2 IMPLANT
GLOVE BIOGEL PI IND STRL 9 (GLOVE) ×1 IMPLANT
GLOVE BIOGEL PI INDICATOR 9 (GLOVE) ×1
GLOVE SURG ORTHO 9.0 STRL STRW (GLOVE) ×4 IMPLANT
GOWN STRL REUS W/ TWL XL LVL3 (GOWN DISPOSABLE) ×3 IMPLANT
GOWN STRL REUS W/TWL XL LVL3 (GOWN DISPOSABLE) ×6
GUIDEWIRE 2.8MM (WIRE) ×1 IMPLANT
GUIDEWIRE NON THREAD 1.6MM (WIRE) ×1 IMPLANT
KIT BASIN OR (CUSTOM PROCEDURE TRAY) ×2 IMPLANT
KIT ROOM TURNOVER OR (KITS) ×2 IMPLANT
NS IRRIG 1000ML POUR BTL (IV SOLUTION) ×2 IMPLANT
PACK ORTHO EXTREMITY (CUSTOM PROCEDURE TRAY) ×2 IMPLANT
PAD ABD 8X10 STRL (GAUZE/BANDAGES/DRESSINGS) ×1 IMPLANT
PAD ARMBOARD 7.5X6 YLW CONV (MISCELLANEOUS) ×4 IMPLANT
PUTTY BONE DBX 5CC MIX (Putty) ×1 IMPLANT
SCREW COMP HEADLEASS 4.5X50 (Screw) ×1 IMPLANT
SCREW HEADLESS 6.5X80X16MM (Screw) ×1 IMPLANT
SPONGE GAUZE 4X4 12PLY STER LF (GAUZE/BANDAGES/DRESSINGS) ×1 IMPLANT
SPONGE LAP 18X18 X RAY DECT (DISPOSABLE) ×2 IMPLANT
SUCTION FRAZIER TIP 10 FR DISP (SUCTIONS) ×2 IMPLANT
SUT ETHILON 2 0 PSLX (SUTURE) ×6 IMPLANT
TOWEL OR 17X24 6PK STRL BLUE (TOWEL DISPOSABLE) ×2 IMPLANT
TOWEL OR 17X26 10 PK STRL BLUE (TOWEL DISPOSABLE) ×2 IMPLANT
TUBE CONNECTING 12X1/4 (SUCTIONS) ×2 IMPLANT
WATER STERILE IRR 1000ML POUR (IV SOLUTION) ×2 IMPLANT
YANKAUER SUCT BULB TIP NO VENT (SUCTIONS) ×1 IMPLANT

## 2014-04-15 NOTE — Op Note (Signed)
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 male with a diagnosis of Traumatic Arthritis Right Subtalar and Talonavicular Joints who failed conservative measures and elected for surgical management.    The risks benefits and alternatives were discussed with the patient preoperatively including but not limited to the risks of infection, bleeding, nerve injury, cardiopulmonary complications, the need for revision surgery, among others, and the patient was willing to proceed.  OPERATIVE IMPLANTS: Synthes 4.5 and 6.5 headless screws. Demineralized bone matrix.  OPERATIVE FINDINGS: Sclerotic bone of the talonavicular and subtalar joint  OPERATIVE PROCEDURE: Patient was brought to the operating room and underwent a general anesthetic after an ankle block. After adequate levels of anesthesia were obtained patient's right lower extremity was prepped using DuraPrep draped into a sterile field. An oblique incision was made over the sinus Tarsi this was carried down through the extensor muscles which were elevated and retracted distally. The peroneal tendons were protected with retractor. Using a saw and osteotome and curet subtalar joint was debrided back to bleeding viable subchondral bone. The wound was irrigated with normal saline. Attention was then focused on the talonavicular joint. A dorsal incision was made over the talonavicular joint this was carried down to the bone retractors were placed to protect the soft tissue and tendons and using the saw and osteotomes and curettes the talonavicular joint was debrided of articular cartilage back to bleeding viable subchondral bone. Patient  had a pronated valgus forefoot the talonavicular joint was reduced and stabilized with a K wire C-arm fluoroscopy verified alignment and then a 4.5 headless screw was used to stabilize the talonavicular joint. A second K wire was then used with the foot and plantar grade slight valgus guidewire was inserted from the calcaneus up into the talus to stabilize the subtalar joint. C-arm possibly verified alignment and a 6.5 headless screw was placed to stabilize the subtalar joint. C-arm fluoroscopy verified alignment. The wounds were irrigated with normal saline. Incisions were closed using 2-0 nylon. A sterile compressive dressing was applied. Patient was extubated taken to the PACU in stable condition.

## 2014-04-15 NOTE — Care Management Note (Signed)
CARE MANAGEMENT NOTE 04/15/2014  Patient:  Travis [name], Travis [name]   Account Number:  [id number]  Date Initiated:  04/15/2014  Documentation initiated by:  Ricki Miller  Subjective/Objective Assessment:   63 yr old male admitted with traumatic arthritis right subtalar, talonavicular joints, patient had right subtalar Talonavicular fusion.     Action/Plan:   PT/OT eval.  case manager will continue to monitor.   Anticipated DC Date:  04/16/2014   Anticipated DC Plan:  [place]  CM consult      Choice offered to / List presented to:             Status of service:  In process, will continue to follow

## 2014-04-15 NOTE — Anesthesia Preprocedure Evaluation (Signed)
Anesthesia Evaluation  Patient identified by MRN, date of birth, ID band Patient awake    Reviewed: Allergy & Precautions, H&P , NPO status , Patient's Chart, lab work & pertinent test results  History of Anesthesia Complications Negative for: history of anesthetic complications  Airway Mallampati: III  TM Distance: >3 FB Neck ROM: Full    Dental  (+) Missing, Poor Dentition, Chipped,    Pulmonary neg sleep apnea, neg COPDneg recent URI, former smoker, neg PE breath sounds clear to auscultation        Cardiovascular hypertension, Pt. on medications - angina+ CAD, + Past MI, + Cardiac Stents and + Peripheral Vascular Disease + dysrhythmias Rhythm:Regular Rate:Normal     Neuro/Psych  Headaches, PSYCHIATRIC DISORDERS Anxiety Depression S/p neck/back surgery, numbness in left thumb and index finger intermittently   Neuromuscular disease    GI/Hepatic negative GI ROS, Neg liver ROS,   Endo/Other  diabetes, Type 2, Oral Hypoglycemic AgentsMorbid obesity  Renal/GU negative Renal ROS     Musculoskeletal  (+) Arthritis -, Fibromyalgia -  Abdominal   Peds  Hematology  (+) anemia ,   Anesthesia Other Findings   Reproductive/Obstetrics                             Anesthesia Physical Anesthesia Plan  ASA: III  Anesthesia Plan: MAC, General and Regional   Post-op Pain Management:    Induction:   Airway Management Planned:   Additional Equipment:   Intra-op Plan:   Post-operative Plan:   Informed Consent: I have reviewed the patients History and Physical, chart, labs and discussed the procedure including the risks, benefits and alternatives for the proposed anesthesia with the patient or authorized representative who has indicated his/her understanding and acceptance.   Dental advisory given  Plan Discussed with: CRNA and Surgeon  Anesthesia Plan Comments:         Anesthesia Quick  Evaluation

## 2014-04-15 NOTE — Evaluation (Signed)
Physical Therapy Evaluation Patient Details Name: Travis [name] MRN: [id number] DOB: November 12, 1950 Today's Date: 04/15/2014   History of Present Illness  Travis [name] is a  63 y.o. male with a diagnosis of Traumatic Arthritis Right Subtalar and Talonavicular Joints; now s/p Right Subtalar, Talonavicular Fusion  Past Medical History  Diagnosis Date  . Impaired glucose tolerance 09/27/2010  . HYPERLIPIDEMIA-MIXED 07/15/2009  . Chronic pain syndrome 10/27/2009  . HYPERTENSION 06/24/2009  . CORONARY ARTERY DISEASE 06/24/2009    a. s/p multiple PCIs - In 2008 he had a Taxus DES to the mild LAD, Endeavor DES to mid LCX and distal LCX. In January 2009 he had DES to distal LCX, mid LCX and proximal LCX. In November 2009 had BMS x 2 to the mid RCA. Cath 10/2011 with patent stents, noncardiac CP. LHC 01/2013: patent stents (noncardiac CP).  [name] URETHRAL STRICTURE 06/24/2009  . SHOULDER PAIN, BILATERAL 06/24/2009  . ANKLE PAIN, RIGHT 06/24/2009  . SCIATICA, LEFT 04/19/2010  . NEPHROLITHIASIS, HX OF 06/24/2009  . ERECTILE DYSFUNCTION, ORGANIC 05/30/2010  . Obesity   . ALLERGIC RHINITIS 06/24/2009  . Anxiety   . Depression     Prior suicide attempt  . Irregular heart beat   . Pneumonia   . Self-catheterizes urinary bladder   . Anginal pain   . Type II diabetes mellitus   . Hepatitis C dx'd 1979  . DEGENERATIVE JOINT DISEASE 06/24/2009  . [place] DISEASE, LUMBAR 04/19/2010  . Arthritis     "all my joints" (09/30/2013)  . Fibromyalgia     "left leg" (09/30/2013)  . Chronic back pain     "my whole back" (09/30/2013)  . Kidney stones     "passed on my own"  . Myocardial infarction 2008  . OSA (obstructive sleep apnea)     not using CPAP (09/30/2013)  . Heart murmur    Past Surgical History  Procedure Laterality Date  . Shoulder open rotator cuff repair Right X 2  . Total knee arthroplasty Bilateral 2008  . Carpal tunnel release Bilateral   . Urethral dilation  X 4  . Knee arthroscopy Right X 7  . Umbilical  hernia repair      UHR  . Hernia repair      umbilical  . Shoulder arthroscopy w/ rotator cuff repair Bilateral     "3on the left; 2 on the right"  . Tonsillectomy    . Knee cartilage surgery Right X 4    "open; not scopes"  . Knee cartilage surgery Left X 3    "open; not scopes"  . Coronary angioplasty with stent placement      "I have 9 stents"  . Cardiac catheterization  X 1  . Anterior cervical decomp/discectomy fusion N/A 09/26/2013    Procedure: ANTERIOR CERVICAL DISCECTOMY FUSION C3-4, plate and screw fixation, allograft bone graft;  Surgeon: Jessy Oto, MD;  Location: [place];  Service: Orthopedics;  Laterality: N/A;  . Laminectomy  01/28/2014    L3 L4 L5  . Lumbar laminectomy/decompression microdiscectomy N/A 01/27/2014    Procedure: CENTRAL LUMBAR LAMINECTOMY L4-5 AND L3-4;  Surgeon: Jessy Oto, MD;  Location: [place];  Service: Orthopedics;  Laterality: N/A;  . Joint replacement Bilateral     knees  . Colonoscopy    . Vasectomy    . Fusion of talonavicular joint Right 04/15/2014    dr duda      Clinical Impression   Patient is s/p above  surgery resulting in functional limitations due  to the deficits listed below (see PT Problem List).  Patient will benefit from skilled PT to increase their independence and safety with mobility to allow discharge to the venue listed below.    Difficulty maintaining TWB RLE; will benefit from having a wheelchair with elevating legrests until able to put full weight on RLE     Follow Up Recommendations Home health PT;Supervision/Assistance - 24 hour    Equipment Recommendations  Wheelchair (measurements PT);Wheelchair cushion (measurements PT) (with elevating legrests)    Recommendations for Other Services OT consult     Precautions / Restrictions Precautions Precautions: Fall Restrictions Weight Bearing Restrictions: Yes RLE Weight Bearing: Touchdown weight bearing      Mobility  Bed Mobility Overal bed mobility: Needs  Assistance Bed Mobility: Supine to Sit     Supine to sit: Supervision     General bed mobility comments: Cues for technique and to self-monitor for activity tolerance  Transfers Overall transfer level: Needs assistance Equipment used: Rolling walker (2 wheeled);Crutches Transfers: Sit to/from Stand Sit to Stand: Mod assist;Min assist         General transfer comment: Stood initially from bed with crutches and required mod asist to steady and keep weight off of RLE; Stood form recliner with RW and noted heavily dependent on UE push on armrests, but needing less phayical assist  Ambulation/Gait Ambulation/Gait assistance: Mod assist;Min assist Ambulation Distance (Feet): 20 Feet (10+10) Assistive device: Crutches;Rolling walker (2 wheeled) Gait Pattern/deviations: Step-to pattern;Trunk flexed     General Gait Details: initiated gait with crutches, however pt requiring mod assist for blance and steadiness; better with RW, however still with difficulty keeping TWB RLE  Stairs            Wheelchair Mobility    Modified Rankin (Stroke Patients Only)       Balance Overall balance assessment: Needs assistance         Standing balance support: Bilateral upper extremity supported Standing balance-Leahy Scale: Poor Standing balance comment: difficulty keeping tWB RLE                             Pertinent Vitals/Pain Pain Assessment: Faces (Nerve block R LE) Faces Pain Scale: Hurts little more Pain Location: R inner thigh spasms, L foot spasms Pain Descriptors / Indicators: Spasm Pain Intervention(s): Limited activity within patient's tolerance;Monitored during session;RN gave pain meds during session    Home Living Family/patient expects to be discharged to:: Private residence Living Arrangements: Other relatives;Non-relatives/Friends Available Help at Discharge: Family Type of Home: Mobile home Home Access: Stairs to enter Entrance Stairs-Rails:  Right;Left;Can reach both Entrance Stairs-Number of Steps: 4 Home Layout: One level Home Equipment: Walker - 2 wheels;Cane - single point;Adaptive equipment Additional Comments: plans to live with brother during recovery    Prior Function Level of Independence: Independent with assistive device(s)         Comments: per pt report occasional use of cane     Hand Dominance   Dominant Hand: Right    Extremity/Trunk Assessment   Upper Extremity Assessment: Overall WFL for tasks assessed           Lower Extremity Assessment: Generalized weakness;RLE deficits/detail RLE Deficits / Details: Ankle immobilized; no active toe wiggle (had a nerve block and it is still surgery day)       Communication   Communication: No difficulties  Cognition Arousal/Alertness: Awake/alert Behavior During Therapy: WFL for tasks assessed/performed Overall Cognitive Status: Within Functional  Limits for tasks assessed                      General Comments General comments (skin integrity, edema, etc.): Noted sanguinous drainage R heel post amb; padded with abd dressing and elevated; RN notified    Exercises        Assessment/Plan    PT Assessment Patient needs continued PT services  PT Diagnosis Difficulty walking;Generalized weakness   PT Problem List Decreased strength;Decreased range of motion;Decreased activity tolerance;Decreased balance;Decreased mobility;Decreased knowledge of use of DME;Decreased knowledge of precautions;Pain  PT Treatment Interventions DME instruction;Gait training;Stair training;Functional mobility training;Therapeutic activities;Therapeutic exercise;Patient/family education;Wheelchair mobility training   PT Goals (Current goals can be found in the Care Plan section) Acute Rehab PT Goals Patient Stated Goal: hopes to go home soon PT Goal Formulation: With patient Time For Goal Achievement: 04/22/14 Potential to Achieve Goals: Good    Frequency Min  5X/week   Barriers to discharge Inaccessible home environment Has 4 steps to enter home    Co-evaluation               End of Session Equipment Utilized During Treatment: Gait belt Activity Tolerance: Patient tolerated treatment well Patient left: in chair;with call bell/phone within reach Nurse Communication: Mobility status;Other (comment) (and noted drainage R heel)    Functional Assessment Tool Used: Clinical Judgement Functional Limitation: Mobility: Walking and moving around Mobility: Walking and Moving Around Current Status [phone]): At least 20 percent but less than 40 percent impaired, limited or restricted Mobility: Walking and Moving Around Goal Status [phone]): At least 1 percent but less than 20 percent impaired, limited or restricted    Time: 1420-1455 PT Time Calculation (min) (ACUTE ONLY): 35 min   Charges:   PT Evaluation $Initial PT Evaluation Tier I: 1 Procedure PT Treatments $Gait Training: 8-22 mins $Therapeutic Activity: 8-22 mins   PT G Codes:   Functional Assessment Tool Used: Clinical Judgement Functional Limitation: Mobility: Walking and moving around    [place] 04/15/2014, 4:11 PM  Roney Marion, [place] Pager [phone] Office [phone]

## 2014-04-15 NOTE — Progress Notes (Signed)
Orthopedic Tech Progress Note Patient Details:  Travis [name] 1950-10-04 [id number]  Ortho Devices Type of Ortho Device: CAM walker Ortho Device/Splint Location: rle Ortho Device/Splint Interventions: Application   Embree Brawley 04/15/2014, 12:04 PM

## 2014-04-15 NOTE — Anesthesia Procedure Notes (Addendum)
Anesthesia Regional Block:  Popliteal block  Pre-Anesthetic Checklist: ,, timeout performed, Correct Patient, Correct Site, Correct Laterality, Correct Procedure, Correct Position, site marked, Risks and benefits discussed,  Surgical consent,  Pre-op evaluation,  At surgeon's request and post-op pain management  Laterality: Lower and Right  Prep: chloraprep       Needles:  Injection technique: Single-shot  Needle Type: Echogenic Stimulator Needle          Additional Needles:  Procedures: ultrasound guided (picture in chart) and nerve stimulator Popliteal block  Nerve Stimulator or Paresthesia:  Response: plantarflexion, 0.5 mA,   Additional Responses:   Narrative:  Injection made incrementally with aspirations every 5 mL.  Performed by: Personally   Additional Notes: H+P and labs reviewed, risks and benefits discussed with patient, procedure tolerated well without complications   Anesthesia Regional Block:  Adductor canal block  Pre-Anesthetic Checklist: ,, timeout performed, Correct Patient, Correct Site, Correct Laterality, Correct Procedure, Correct Position, site marked, Risks and benefits discussed,  Surgical consent,  Pre-op evaluation,  At surgeon's request and post-op pain management  Laterality: Lower and Right  Prep: chloraprep       Needles:  Injection technique: Single-shot  Needle Type: Echogenic Needle          Additional Needles:  Procedures: ultrasound guided (picture in chart) Adductor canal block Narrative:  Injection made incrementally with aspirations every 5 mL.  Performed by: Personally   Additional Notes: H+P and labs reviewed, risks and benefits discussed with patient, procedure tolerated well without complications

## 2014-04-15 NOTE — Anesthesia Postprocedure Evaluation (Signed)
  Anesthesia Post-op Note  Patient: Travis [name]  Procedure(s) Performed: Procedure(s): Right Subtalar, Talonavicular Fusion (Right)  Patient Location: PACU  Anesthesia Type:General and Regional  Level of Consciousness: awake and alert   Airway and Oxygen Therapy: Patient Spontanous Breathing  Post-op Pain: none  Post-op Assessment: Post-op Vital signs reviewed, Patient's Cardiovascular Status Stable, Respiratory Function Stable, Patent Airway, No signs of Nausea or vomiting and Pain level controlled  Post-op Vital Signs: Reviewed and stable  Last Vitals:  Filed Vitals:   04/15/14 1045  BP: 114/59  Pulse: 52  Temp: 36.4 C  Resp: 16    Complications: No apparent anesthesia complications

## 2014-04-15 NOTE — H&P (Signed)
Travis [name] is an 63 y.o. male.   Chief Complaint: Posterior tibial tendon insufficiency with subtalar arthrosis HPI: Patient is a 63 year old gentleman with diabetes posterior tibial tendon insufficiency who has failed conservative care with pronated valgus hindfoot and pain with activities of daily living.  Past Medical History  Diagnosis Date  . Impaired glucose tolerance 09/27/2010  . HYPERLIPIDEMIA-MIXED 07/15/2009  . Chronic pain syndrome 10/27/2009  . HYPERTENSION 06/24/2009  . CORONARY ARTERY DISEASE 06/24/2009    a. s/p multiple PCIs - In 2008 he had a Taxus DES to the mild LAD, Endeavor DES to mid LCX and distal LCX. In January 2009 he had DES to distal LCX, mid LCX and proximal LCX. In November 2009 had BMS x 2 to the mid RCA. Cath 10/2011 with patent stents, noncardiac CP. LHC 01/2013: patent stents (noncardiac CP).  [name] URETHRAL STRICTURE 06/24/2009  . SHOULDER PAIN, BILATERAL 06/24/2009  . ANKLE PAIN, RIGHT 06/24/2009  . SCIATICA, LEFT 04/19/2010  . NEPHROLITHIASIS, HX OF 06/24/2009  . ERECTILE DYSFUNCTION, ORGANIC 05/30/2010  . Obesity   . ALLERGIC RHINITIS 06/24/2009  . Anxiety   . Depression     Prior suicide attempt  . Irregular heart beat   . Pneumonia   . Self-catheterizes urinary bladder   . Anginal pain   . Type II diabetes mellitus   . Hepatitis C dx'd 1979  . DEGENERATIVE JOINT DISEASE 06/24/2009  . [place] DISEASE, LUMBAR 04/19/2010  . Arthritis     "all my joints" (09/30/2013)  . Fibromyalgia     "left leg" (09/30/2013)  . Chronic back pain     "my whole back" (09/30/2013)  . Kidney stones     "passed on my own"  . Myocardial infarction 2008  . OSA (obstructive sleep apnea)     not using CPAP (09/30/2013)  . Heart murmur     Past Surgical History  Procedure Laterality Date  . Shoulder open rotator cuff repair Right X 2  . Total knee arthroplasty Bilateral 2008  . Carpal tunnel release Bilateral   . Urethral dilation  X 4  . Knee arthroscopy Right X 7  . Umbilical  hernia repair      UHR  . Hernia repair      umbilical  . Shoulder arthroscopy w/ rotator cuff repair Bilateral     "3on the left; 2 on the right"  . Tonsillectomy    . Knee cartilage surgery Right X 4    "open; not scopes"  . Knee cartilage surgery Left X 3    "open; not scopes"  . Coronary angioplasty with stent placement      "I have 9 stents"  . Cardiac catheterization  X 1  . Anterior cervical decomp/discectomy fusion N/A 09/26/2013    Procedure: ANTERIOR CERVICAL DISCECTOMY FUSION C3-4, plate and screw fixation, allograft bone graft;  Surgeon: Jessy Oto, MD;  Location: [facility];  Service: Orthopedics;  Laterality: N/A;  . Laminectomy  01/28/2014    L3 L4 L5  . Lumbar laminectomy/decompression microdiscectomy N/A 01/27/2014    Procedure: CENTRAL LUMBAR LAMINECTOMY L4-5 AND L3-4;  Surgeon: Jessy Oto, MD;  Location: [facility];  Service: Orthopedics;  Laterality: N/A;  . Joint replacement Bilateral     knees  . Colonoscopy    . Vasectomy      Family History  Problem Relation Age of Onset  . Depression Mother   . Heart disease Mother   . Hypertension Mother   . Cancer Mother  Breast  . Diabetes Father   . Heart disease Father     CABG  . Cancer Father     prostate and skin cancer  . Hypertension Father   . Hyperlipidemia Father   . Depression Brother     x 2  . Hypertension Brother     x2  . Coronary artery disease Other   . Hypertension Other   . Depression Other   . Heart disease Maternal Grandfather   . Early death Maternal Grandfather 37    heart attack  . Early death Paternal Grandfather    Social History:  reports that he quit smoking about 3 years ago. His smoking use included Cigars. He has never used smokeless tobacco. He reports that he does not drink alcohol or use illicit drugs.  Allergies: No Known Allergies  Medications Prior to Admission  Medication Sig Dispense Refill  . amLODipine (NORVASC) 5 MG tablet Take 1 tablet (5 mg total) by mouth  daily.    . ARIPiprazole (ABILIFY) 5 MG tablet TAKE 1 TABLET BY MOUTH DAILY 90 tablet 0  . aspirin 81 MG tablet Take 1 tablet (81 mg total) by mouth daily. 30 tablet 0  . atorvastatin (LIPITOR) 20 MG tablet Take 20 mg by mouth daily.    . benazepril (LOTENSIN) 20 MG tablet Take 1 tablet (20 mg total) by mouth daily. HOLD for next 2 days    . clonazePAM (KLONOPIN) 2 MG tablet Take one tablet by mouth twice daily as needed for anxiety 60 tablet 5  . cyclobenzaprine (FLEXERIL) 5 MG tablet Take 1 tablet (5 mg total) by mouth 3 (three) times daily as needed for muscle spasms. 30 tablet 11  . divalproex (DEPAKOTE) 250 MG DR tablet Take 250-500 mg by mouth 2 (two) times daily. 250 mg in morning and 500 mg at bedtime    . ferrous gluconate (FERGON) 324 MG tablet Take 1 tablet (324 mg total) by mouth 3 (three) times daily with meals. 60 tablet 3  . FLUoxetine (PROZAC) 40 MG capsule Take 40 mg by mouth daily.    . fluticasone (FLONASE) 50 MCG/ACT nasal spray Place 1 spray into both nostrils daily.    [name] gabapentin (NEURONTIN) 400 MG capsule Take 800 mg by mouth 4 (four) times daily.    . isosorbide mononitrate (IMDUR) 60 MG 24 hr tablet Take 1 tablet (60 mg total) by mouth daily. HOLD for next 2 days    . metFORMIN (GLUCOPHAGE) 500 MG tablet Take 500 mg by mouth 2 (two) times daily with a meal.     . Omega-3 Fatty Acids (FISH OIL) 1000 MG CAPS Take 2 capsules by mouth daily.    [name] oxyCODONE (ROXICODONE) 15 MG immediate release tablet 1-2 tabs q 3-4 hrs prn severe breakthru pain. 90 tablet 0  . polyethylene glycol (MIRALAX / GLYCOLAX) packet Take 17 g by mouth 2 (two) times daily. (Patient taking differently: Take 17 g by mouth daily. ) 60 each 3  . prasugrel (EFFIENT) 10 MG TABS tablet Take 1 tablet (10 mg total) by mouth daily. 30 tablet   . sildenafil (VIAGRA) 50 MG tablet Take 1 tablet (50 mg total) by mouth daily as needed for erectile dysfunction. 10 tablet 0  . tamsulosin (FLOMAX) 0.4 MG CAPS capsule  Take 0.4 mg by mouth daily after breakfast.    . traZODone (DESYREL) 100 MG tablet TAKE 1 TABLET BY MOUTH EVERY NIGHT AT BEDTIME 30 tablet 0    No results found for  this or any previous visit (from the past 48 hour(s)). No results found.  Review of Systems  All other systems reviewed and are negative.   There were no vitals taken for this visit. Physical Exam  On examination patient has a palpable dorsalis pedis pulse he has a fixed pronated valgus hindfoot pain to palpation the sinus Tarsi pain to palpation along the posterior tibial tendon Assessment/Plan Assessment: Fixed pronated valgus hindfoot with posterior tibial tendon insufficiency with diabetic insensate neuropathy.  Plan: We'll plan for talonavicular and subtalar fusion. Risks and benefits were discussed patient states he understands and wished to proceed at this time.  DUDA,MARCUS V 04/15/2014, 6:51 AM

## 2014-04-15 NOTE — Transfer of Care (Signed)
Immediate Anesthesia Transfer of Care Note  Patient: Travis [name]  Procedure(s) Performed: Procedure(s): Right Subtalar, Talonavicular Fusion (Right)  Patient Location: PACU  Anesthesia Type:General  Level of Consciousness: awake, alert , oriented and patient cooperative  Airway & Oxygen Therapy: Patient Spontanous Breathing and Patient connected to nasal cannula oxygen  Post-op Assessment: Report given to PACU RN, Post -op Vital signs reviewed and stable and Patient moving all extremities X 4  Post vital signs: Reviewed and stable  Complications: No apparent anesthesia complications

## 2014-04-15 NOTE — Plan of Care (Signed)
Problem: Phase III Progression Outcomes Goal: Pain controlled on oral analgesia Outcome: Completed/Met Date Met:  04/15/14     

## 2014-04-16 DIAGNOSIS — M12571 Traumatic arthropathy, right ankle and foot: Secondary | ICD-10-CM | POA: Diagnosis not present

## 2014-04-16 LAB — GLUCOSE, CAPILLARY
GLUCOSE-CAPILLARY: 107 mg/dL — AB (ref 70–99)
Glucose-Capillary: 126 mg/dL — ABNORMAL HIGH (ref 70–99)
Glucose-Capillary: 134 mg/dL — ABNORMAL HIGH (ref 70–99)
Glucose-Capillary: 136 mg/dL — ABNORMAL HIGH (ref 70–99)

## 2014-04-16 MED ORDER — OXYCODONE HCL 15 MG PO TABS: 15.0000 mg | ORAL_TABLET | ORAL | Status: DC | PRN

## 2014-04-16 NOTE — Discharge Summary (Addendum)
Physician Discharge Summary  Patient ID: Travis [name] MRN: [id number] DOB/AGE: 1950-10-28 63 y.o.  Admit date: 04/15/2014 Discharge date: 04/17/2014  Admission Diagnoses:  Active Problems:   Arthritis of foot, right, degenerative   Discharge Diagnoses:  Same  Past Medical History  Diagnosis Date  . Impaired glucose tolerance 09/27/2010  . HYPERLIPIDEMIA-MIXED 07/15/2009  . Chronic pain syndrome 10/27/2009  . HYPERTENSION 06/24/2009  . CORONARY ARTERY DISEASE 06/24/2009    a. s/p multiple PCIs - In 2008 he had a Taxus DES to the mild LAD, Endeavor DES to mid LCX and distal LCX. In January 2009 he had DES to distal LCX, mid LCX and proximal LCX. In November 2009 had BMS x 2 to the mid RCA. Cath 10/2011 with patent stents, noncardiac CP. LHC 01/2013: patent stents (noncardiac CP).  [name] URETHRAL STRICTURE 06/24/2009  . SHOULDER PAIN, BILATERAL 06/24/2009  . ANKLE PAIN, RIGHT 06/24/2009  . SCIATICA, LEFT 04/19/2010  . NEPHROLITHIASIS, HX OF 06/24/2009  . ERECTILE DYSFUNCTION, ORGANIC 05/30/2010  . Obesity   . ALLERGIC RHINITIS 06/24/2009  . Anxiety   . Depression     Prior suicide attempt  . Irregular heart beat   . Pneumonia   . Self-catheterizes urinary bladder   . Anginal pain   . Type II diabetes mellitus   . Hepatitis C dx'd 1979  . DEGENERATIVE JOINT DISEASE 06/24/2009  . [place] DISEASE, LUMBAR 04/19/2010  . Arthritis     "all my joints" (09/30/2013)  . Fibromyalgia     "left leg" (09/30/2013)  . Chronic back pain     "my whole back" (09/30/2013)  . Kidney stones     "passed on my own"  . Myocardial infarction 2008  . OSA (obstructive sleep apnea)     not using CPAP (09/30/2013)  . Heart murmur     Surgeries: Procedure(s): Right Subtalar, Talonavicular Fusion on 04/15/2014   Consultants:    Discharged Condition: Improved  Hospital Course: Travis [name] is an 63 y.o. male who was admitted 04/15/2014 with a chief complaint of No chief complaint on file. , and found to have a  diagnosis of <principal problem not specified>.  They were brought to the operating room on 04/15/2014 and underwent the above named procedures.    They were given perioperative antibiotics:      Anti-infectives    Start     Dose/Rate Route Frequency Ordered Stop   04/15/14 1700  ceFAZolin (ANCEF) IVPB 2 g/50 mL premix     2 g100 mL/hr over 30 Minutes Intravenous Every 6 hours 04/15/14 1040 04/16/14 0601   04/15/14 0600  ceFAZolin (ANCEF) 3 g in dextrose 5 % 50 mL IVPB     3 g160 mL/hr over 30 Minutes Intravenous On call to O.R. 04/14/14 1340 04/15/14 0847    POD#1 awake and alert. Elevated right foot and ankle for 24 hours, dressing reinforced x 2. In am dressing changed and kerlix and ABDs applied leaving the Original dressing. He is to elevate as often as possible. Use the CAM walking brace. Take aspirin daily. He was scheduled discharged home on POD#1. Patient was seen on postoperative day #1 by physical therapy and felt not to be a good candidate for discharge to home due to obvious dependencies and a situation in which he lives alone at home. Skilled nursing facility placement made for short-term postoperative rehabilitation including gait training and narcotic medication oversight. The patient seen on postoperative day #2 awake alert and oriented 4 dressing right lower extremity in  good condition without saturation. Social service consult obtained and the patient being a member of [employer] will require authorization prior to transfer to a skilled nursing facility. When a skilled nursing facility is available patient will be discharge to the skilled nursing facility. They were given sequential compression devices, early ambulation DVT prophylaxis.  They benefited maximally from their hospital stay and there were no complications.    Recent vital signs:  Filed Vitals:   04/17/14 0524  BP: 111/53  Pulse: 90  Temp: 99.5 F (37.5 C)  Resp: 18    Recent laboratory studies:  Results for  orders placed or performed during the hospital encounter of 04/15/14  APTT  Result Value Ref Range   aPTT 31 24 - 37 seconds  Protime-INR  Result Value Ref Range   Prothrombin Time 15.2 11.6 - 15.2 seconds   INR 1.18 0.00 - 1.49  Glucose, capillary  Result Value Ref Range   Glucose-Capillary 105 (H) 70 - 99 mg/dL  Glucose, capillary  Result Value Ref Range   Glucose-Capillary 93 70 - 99 mg/dL   Comment 1 Documented in Chart    Comment 2 Notify RN   Glucose, capillary  Result Value Ref Range   Glucose-Capillary 98 70 - 99 mg/dL   Comment 1 Notify RN   Glucose, capillary  Result Value Ref Range   Glucose-Capillary 110 (H) 70 - 99 mg/dL   Comment 1 Notify RN   Glucose, capillary  Result Value Ref Range   Glucose-Capillary 114 (H) 70 - 99 mg/dL  Glucose, capillary  Result Value Ref Range   Glucose-Capillary 126 (H) 70 - 99 mg/dL  Glucose, capillary  Result Value Ref Range   Glucose-Capillary 134 (H) 70 - 99 mg/dL   Comment 1 Notify RN   Glucose, capillary  Result Value Ref Range   Glucose-Capillary 107 (H) 70 - 99 mg/dL   Comment 1 Notify RN   Glucose, capillary  Result Value Ref Range   Glucose-Capillary 136 (H) 70 - 99 mg/dL   Comment 1 Notify RN   Glucose, capillary  Result Value Ref Range   Glucose-Capillary 131 (H) 70 - 99 mg/dL    Discharge Medications:     Medication List    TAKE these medications        amLODipine 5 MG tablet  Commonly known as:  NORVASC  Take 1 tablet (5 mg total) by mouth daily.     ARIPiprazole 5 MG tablet  Commonly known as:  ABILIFY  TAKE 1 TABLET BY MOUTH DAILY     aspirin 81 MG tablet  Take 1 tablet (81 mg total) by mouth daily.     atorvastatin 20 MG tablet  Commonly known as:  LIPITOR  Take 20 mg by mouth daily.     benazepril 20 MG tablet  Commonly known as:  LOTENSIN  Take 1 tablet (20 mg total) by mouth daily. HOLD for next 2 days     clonazePAM 2 MG tablet  Commonly known as:  KLONOPIN  Take one tablet by  mouth twice daily as needed for anxiety     cyclobenzaprine 5 MG tablet  Commonly known as:  FLEXERIL  Take 1 tablet (5 mg total) by mouth 3 (three) times daily as needed for muscle spasms.     divalproex 250 MG DR tablet  Commonly known as:  DEPAKOTE  Take 250-500 mg by mouth 2 (two) times daily. 250 mg in morning and 500 mg at bedtime  ferrous gluconate 324 MG tablet  Commonly known as:  FERGON  Take 1 tablet (324 mg total) by mouth 3 (three) times daily with meals.     Fish Oil 1000 MG Caps  Take 2 capsules by mouth daily.     FLUoxetine 40 MG capsule  Commonly known as:  PROZAC  Take 40 mg by mouth daily.     fluticasone 50 MCG/ACT nasal spray  Commonly known as:  FLONASE  Place 1 spray into both nostrils daily.     gabapentin 400 MG capsule  Commonly known as:  NEURONTIN  Take 800 mg by mouth 4 (four) times daily.     isosorbide mononitrate 60 MG 24 hr tablet  Commonly known as:  IMDUR  Take 1 tablet (60 mg total) by mouth daily. HOLD for next 2 days     metFORMIN 500 MG tablet  Commonly known as:  GLUCOPHAGE  Take 500 mg by mouth 2 (two) times daily with a meal.     oxyCODONE 15 MG immediate release tablet  Commonly known as:  ROXICODONE  1-2 tabs q 3-4 hrs prn severe breakthru pain.     oxyCODONE 15 MG immediate release tablet  Commonly known as:  ROXICODONE  Take 1 tablet (15 mg total) by mouth every 4 (four) hours as needed for moderate pain or severe pain.     polyethylene glycol packet  Commonly known as:  MIRALAX / GLYCOLAX  Take 17 g by mouth 2 (two) times daily.     prasugrel 10 MG Tabs tablet  Commonly known as:  EFFIENT  Take 1 tablet (10 mg total) by mouth daily.     sildenafil 50 MG tablet  Commonly known as:  VIAGRA  Take 1 tablet (50 mg total) by mouth daily as needed for erectile dysfunction.     tamsulosin 0.4 MG Caps capsule  Commonly known as:  FLOMAX  Take 0.4 mg by mouth daily after breakfast.     traZODone 100 MG tablet   Commonly known as:  DESYREL  TAKE 1 TABLET BY MOUTH EVERY NIGHT AT BEDTIME        Diagnostic Studies: No results found.  Disposition: 01-Home or Self Care  Discharge Instructions    Call MD / Call 911    Complete by:  As directed   If you experience chest pain or shortness of breath, CALL 911 and be transported to the hospital emergency room.  If you develope a fever above 101 F, pus (white drainage) or increased drainage or redness at the wound, or calf pain, call your surgeon's office.     Call MD / Call 911    Complete by:  As directed   If you experience chest pain or shortness of breath, CALL 911 and be transported to the hospital emergency room.  If you develope a fever above 101 F, pus (white drainage) or increased drainage or redness at the wound, or calf pain, call your surgeon's office.     Constipation Prevention    Complete by:  As directed   Drink plenty of fluids.  Prune juice may be helpful.  You may use a stool softener, such as Colace (over the counter) 100 mg twice a day.  Use MiraLax (over the counter) for constipation as needed.     Constipation Prevention    Complete by:  As directed   Drink plenty of fluids.  Prune juice may be helpful.  You may use a stool softener, such as Colace (  over the counter) 100 mg twice a day.  Use MiraLax (over the counter) for constipation as needed.     Diet - low sodium heart healthy    Complete by:  As directed      Diet - low sodium heart healthy    Complete by:  As directed      Discharge instructions    Complete by:  As directed   Keep short leg splint and dressing dry. May use water impervious bag or cast bag and tub chair to shower Tape the top of bag to skin to avoid moisture soaking the dressing on the leg. Call if there is odor or saturation of dressing or worsening pain not controlled with medications. Call if fever greater than 101.5. Use crutches or walker no weight bearing on the ankle fracture leg. Please follow up  with an appointment with Dr.Duda on 04/23/2014. Elevate as often as possible during the first week after surgery gradually increasing the time the leg is dependent or down there after. If swelling recurrs then elevate again. Wheel chair for longer distances. Take aspirin daily.     Increase activity slowly as tolerated    Complete by:  As directed      Increase activity slowly as tolerated    Complete by:  As directed            Follow-up Information    Follow up with Newt Minion, MD. Go on 04/23/2014.   Specialty:  Orthopedic Surgery   Why:  For wound re-check   Contact information:   [place] [address] [phone]      Discharge date was added on 05/01/2014. It was not a part of the original discharge summary as the discharge summary is need to  Process the discharge and facilitate transfer to the SNF. The patient's discharge summary is necessarily needed before the patient  Can be discharged.  Signed: NITKA,JAMES E 04/17/2014, 10:47 AM

## 2014-04-16 NOTE — Progress Notes (Signed)
Patient ID: Travis [name], male   DOB: [birth year], 63 y.o.   MRN: [id number] Subjective: 1 Day Post-Op Procedure(s) (LRB): Right Subtalar, Talonavicular Fusion (Right) Awake, alert and oriented x 4. Right foot dressing reinforced x2 with ace wraps and ABD. Moderate pain. Wants to go home today. Takes oxycodone 15 mg tablets. Dr. Sharol Given wrote for 5 mg oxycodone for home Patient reports pain as moderate.    Objective:   VITALS:  Temp:  [97.6 F (36.4 C)-98.6 F (37 C)] 98.5 F (36.9 C) (11/26 0532) Pulse Rate:  [51-83] 83 (11/26 0532) Resp:  [12-18] 18 (11/26 0532) BP: (80-125)/(46-62) 125/58 mmHg (11/26 0532) SpO2:  [94 %-100 %] 95 % (11/26 0532)  Neurologically intact ABD soft Neurovascular intact Sensation intact distally Intact pulses distally Incision: moderate drainage and Reinforcement of the dressing and extra ace compression discontinued. Regular dressing left intact. Reinforced with ABDsx3 and kerlix. should use walking brace when up.   LABS No results for input(s): HGB, WBC, PLT in the last 72 hours. No results for input(s): NA, K, CL, CO2, BUN, CREATININE, GLUCOSE in the last 72 hours.  Recent Labs  04/15/14 0715  INR 1.18     Assessment/Plan: 1 Day Post-Op Procedure(s) (LRB): Right Subtalar, Talonavicular Fusion (Right)  Advance diet Up with therapy Discharge home.   NITKA,JAMES E 04/16/2014, 9:08 AM

## 2014-04-16 NOTE — Discharge Instructions (Addendum)
Keep short leg splint and dressing dry. May use water impervious bag or cast bag and tub chair to shower Tape the top of bag to skin to avoid moisture soaking the dressing on the leg. Call if there is odor or saturation of dressing or worsening pain not controlled with medications. Call if fever greater than 101.5. Use crutches or walker no weight bearing on the ankle fracture leg. Please follow up with an appointment with Dr.Duda 04/23/2014. Elevate as often as possible during the first week after surgery gradually increasing the time the leg is dependent or down there after. If swelling recurrs then elevate again. Wheel chair for longer distances. Take aspirin daily.

## 2014-04-16 NOTE — Progress Notes (Signed)
Physical Therapy Treatment Patient Details Name: Travis [name] MRN: [id number] DOB: [birth year] Today's Date: 04/16/2014    History of Present Illness Travis [name] is a  63 y.o. 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 PT entered room and pt was slumped over unto highly elevated HOB. PT lowered HOB and patient was unable to hold his trunk up without support. Pt stated he wanted to go to the bathroom. Two PTs attempted to stand with pt to transfer to [facility]. Upon standing, pt was 2+ max assist and unable to follow weight bearing precautions even with cuing. PT noticed that pt's pants and gown were saturated with urine. Nurse tech was called and pt was put back into bed. PT and nurse tech cleaned pt and changed gown. Pt is not appropriate for discharge to brother's home at this time due to current assistance level. PT believes that pt would be more appropriately discharged to SNF with 24 hour assistance at this time based on pt's current level of function. PT called pt's nurse and communicated concern about current discharge plan.    Follow Up Recommendations  SNF     Equipment Recommendations  Wheelchair (measurements PT);Wheelchair cushion (measurements PT)    Recommendations for Other Services       Precautions / Restrictions Precautions Precautions: Fall Restrictions Weight Bearing Restrictions: Yes RLE Weight Bearing: Touchdown weight bearing    Mobility  Bed Mobility                  Transfers Overall transfer level: Needs assistance Equipment used: Rolling walker (2 wheeled) Transfers: Sit to/from Stand Sit to Stand: Max assist;+2 physical assistance         General transfer comment: Pt required 2+ max assist to perform sit to stand using RW. Pt required cuing and had difficulty  maintaining weight bearing precautions. Pt had significant Right posterior lean in standing. Pt sat back down in bed because pt was found to have had episode of urinary incontinence. Nurse tech was notified and PT aided with clean up of patient.   Ambulation/Gait                 Stairs            Wheelchair Mobility    Modified Rankin (Stroke Patients Only)       Balance Overall balance assessment: Needs assistance Sitting-balance support: Bilateral upper extremity supported;Feet supported Sitting balance-Leahy Scale: Poor     Standing balance support: During functional activity;Bilateral upper extremity supported Standing balance-Leahy Scale: Zero Standing balance comment: Pt unable to stand without 2+ max assistance and still remains unsafe.                     Cognition Arousal/Alertness: Lethargic;Suspect due to medications Behavior During Therapy: Flat affect Overall Cognitive Status: Difficult to assess                      Exercises      General Comments General comments (skin integrity, edema, etc.): Pt found to have urinary incontinence once attempted to stand. Nurse tech was called and PT/Nurse tech cleaned patient and changed gown.       Pertinent Vitals/Pain      Home Living  Prior Function            PT Goals (current goals can now be found in the care plan section) Progress towards PT goals: Not progressing toward goals - comment (2+ max assist to stand today; significant urinary incontinence)    Frequency  Min 5X/week    PT Plan Discharge plan needs to be updated    Co-evaluation             End of Session Equipment Utilized During Treatment: Gait belt Activity Tolerance: Other (comment);Patient limited by lethargy;Patient limited by fatigue (Episode of urinary incontinence) Patient left: in bed;with nursing/sitter in room     Time:  -     Charges:                       G  CodesJearld Shines 04-23-14, 2:47 PM

## 2014-04-16 NOTE — Plan of Care (Signed)
Problem: Phase I Progression Outcomes Goal: Tubes/drains patent Outcome: Completed/Met Date Met:  04/16/14

## 2014-04-16 NOTE — Progress Notes (Signed)
Charges for 04/16/14 PT   04/16/14 1500  PT Time Calculation  PT Start Time (ACUTE ONLY) 1410  PT Stop Time (ACUTE ONLY) 1438  PT Time Calculation (min) (ACUTE ONLY) 28 min  PT General Charges  $$ ACUTE PT VISIT 1 Procedure  PT Treatments  $Therapeutic Activity 8-22 mins  $Self Care/Home Management 8-22  [facility] [phone] (pager)

## 2014-04-16 NOTE — Plan of Care (Signed)
Problem: Consults Goal: General Surgical Patient Education (See Patient Education module for education specifics)  Outcome: Completed/Met Date Met:  04/16/14 Goal: Skin Care Protocol Initiated - if Braden Score 18 or less If consults are not indicated, leave blank or document N/A  Outcome: Completed/Met Date Met:  04/16/14 Goal: Nutrition Consult-if indicated Outcome: Not Applicable Date Met:  81/59/47 Goal: Diabetes Guidelines if Diabetic/Glucose > 140 If diabetic or lab glucose is > 140 mg/dl - Initiate Diabetes/Hyperglycemia Guidelines & Document Interventions  Outcome: Completed/Met Date Met:  04/16/14  Problem: Phase I Progression Outcomes Goal: Pain controlled with appropriate interventions Outcome: Completed/Met Date Met:  04/16/14 Goal: OOB as tolerated unless otherwise ordered Outcome: Completed/Met Date Met:  04/16/14

## 2014-04-16 NOTE — Progress Notes (Signed)
PT Cancellation Note  Patient Details Name: Travis [name] MRN: [id number] DOB: 02-19-1951   Cancelled Treatment:    Reason Eval/Treat Not Completed:  (pt just got meds and was groggy.)Thanks.   Irwin Brakeman F 04/16/2014, 1:07 PM [facility] [phone] (pager)

## 2014-04-17 DIAGNOSIS — M12571 Traumatic arthropathy, right ankle and foot: Secondary | ICD-10-CM | POA: Diagnosis not present

## 2014-04-17 LAB — GLUCOSE, CAPILLARY
GLUCOSE-CAPILLARY: 131 mg/dL — AB (ref 70–99)
Glucose-Capillary: 145 mg/dL — ABNORMAL HIGH (ref 70–99)

## 2014-04-17 MED ORDER — GABAPENTIN 400 MG PO CAPS: 800.0000 mg | ORAL_CAPSULE | Freq: Four times a day (QID) | ORAL | Status: DC

## 2014-04-17 MED ORDER — TRAZODONE HCL 100 MG PO TABS: 100.0000 mg | ORAL_TABLET | Freq: Every day | ORAL | Status: DC

## 2014-04-17 MED ORDER — DIVALPROEX SODIUM 250 MG PO DR TAB: 250.0000 mg | DELAYED_RELEASE_TABLET | Freq: Two times a day (BID) | ORAL | Status: DC

## 2014-04-17 MED ORDER — CLONAZEPAM 1 MG PO TABS: 2.0000 mg | ORAL_TABLET | Freq: Two times a day (BID) | ORAL | Status: DC | PRN

## 2014-04-17 MED ORDER — CLONAZEPAM 2 MG PO TABS: 2.0000 mg | ORAL_TABLET | Freq: Two times a day (BID) | ORAL | Status: DC | PRN

## 2014-04-17 MED ORDER — ASPIRIN 81 MG PO TABS: 81.0000 mg | ORAL_TABLET | Freq: Every day | ORAL | Status: DC

## 2014-04-17 MED ORDER — OXYCODONE HCL 15 MG PO TABS: 15.0000 mg | ORAL_TABLET | ORAL | Status: DC | PRN

## 2014-04-17 MED ORDER — DSS 100 MG PO CAPS: 100.0000 mg | ORAL_CAPSULE | Freq: Two times a day (BID) | ORAL | Status: DC

## 2014-04-17 MED ORDER — POLYETHYLENE GLYCOL 3350 17 G PO PACK: 17.0000 g | PACK | Freq: Every day | ORAL | Status: DC

## 2014-04-17 MED ORDER — CYCLOBENZAPRINE HCL 5 MG PO TABS: 5.0000 mg | ORAL_TABLET | Freq: Three times a day (TID) | ORAL | Status: DC | PRN

## 2014-04-17 MED ORDER — METHOCARBAMOL 500 MG PO TABS: 500.0000 mg | ORAL_TABLET | Freq: Four times a day (QID) | ORAL | Status: DC | PRN

## 2014-04-17 NOTE — Clinical Social Work Note (Addendum)
Patient to be discharged today per MD order to [facility] RN to call report to [phone] Transportation: [facility]- scheduled for 3:15pm Authorization received at 3:05pm: [id number]  CSW reviewed dc plans with patient and patient's brother, Delfino Lovett.  RN updated and aware.    Nonnie Done, [place] [phone]  Psychiatric & Orthopedics (5N 1-16) Clinical Social Worker

## 2014-04-17 NOTE — Clinical Social Work Placement (Addendum)
Clinical Social Work Department CLINICAL SOCIAL WORK PLACEMENT NOTE 04/17/2014  Patient:  Travis [name], Travis [name]  Account Number:  [id number] Admit date:  04/15/2014  Clinical Social Worker:  Wylene Men  Date/time:  04/17/2014 12:08 PM  Clinical Social Work is seeking post-discharge placement for this patient at the following level of care:   SKILLED NURSING   (*CSW will update this form in Epic as items are completed)   04/17/2014  Patient/family provided with [place] Department of Clinical Social Work's list of facilities offering this level of care within the geographic area requested by the patient (or if unable, by the patient's family).  04/17/2014  Patient/family informed of their freedom to choose among providers that offer the needed level of care, that participate in Medicare, Medicaid or managed care program needed by the patient, have an available bed and are willing to accept the patient.  04/17/2014  Patient/family informed of [facility]' ownership interest in [facility], as well as of the fact that they are under no obligation to receive care at this facility.  PASARR submitted to EDS on 04/17/2014 PASARR number received on 04/17/2014  FL2 transmitted to all facilities in geographic area requested by pt/family on  04/17/2014 FL2 transmitted to all facilities within larger geographic area on   Patient informed that his/her managed care company has contracts with or will negotiate with  certain facilities, including the following:     Patient/family informed of bed offers received:  04/17/2014 Patient chooses bed at [facility] Physician recommends and patient chooses bed at    Patient to be transferred to  [facility] on 04/17/2014   Patient to be transferred to facility by [facility] Patient and family notified of transfer on 04/17/2014 Name of family member notified:  Delfino Lovett, brother and patient notified and updated   The following  physician request were entered in Epic:   Additional Comments:  Nonnie Done, [place] [phone]  Psychiatric & Orthopedics (5N 1-16) Clinical Social Worker

## 2014-04-17 NOTE — Progress Notes (Signed)
Physical Therapy Treatment Patient Details Name: Travis [name] MRN: [id number] DOB: November 30, 1950 Today's Date: 04/17/2014    History of Present Illness Travis [name] is a  63 y.o. 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  RN Cary in and noted that his decreased alertness may be due to med schedule for his Klonipin.  I requested that nursing staff assist pt. OOB with lift equipment due to his inability to attempt transfer with PT.  Note pt. Still "observation " status and not inpatient.  Unsure if he qualifies as IP status for SNF placement.    Follow Up Recommendations  SNF     Equipment Recommendations  Wheelchair (measurements PT);Wheelchair cushion (measurements PT)    Recommendations for Other Services       Precautions / Restrictions Precautions Precautions: Fall Required Braces or Orthoses: Other Brace/Splint Other Brace/Splint: cam boot found in room but not on pt. at this time Restrictions Weight Bearing Restrictions: Yes RLE Weight Bearing: Touchdown weight bearing    Mobility  Bed Mobility Overal bed mobility: Needs Assistance Bed Mobility: Supine to Sit;Sit to Supine     Supine to sit: Max assist;HOB elevated Sit to supine: Max assist   General bed mobility comments: Pt. very drowsy which limited his ability to foloow directions or participate.  Pt. needed greatlty increased time and max assist to move supine<-> sit.  RN Jeani Hawking present and discussing with pt. his home schedule Klonipin.  Pt. states he takes 2 tabs at night.  Pt. currently receioving this med in the am and later in the day per RN.  This could be contributing to current decreased alertness   Transfers Overall transfer level:  (pt. unable to attempt)                   Ambulation/Gait                 Stairs            Wheelchair Mobility    Modified Rankin (Stroke Patients Only)       Balance Overall balance assessment: Needs assistance Sitting-balance support: Bilateral upper extremity supported;Feet supported Sitting balance-Leahy Scale: Poor Sitting balance - Comments: pt. sat at EOB x 10 minutes with intermittent physical assist and verbal stimulation to increase his level of alertness.  Pt. too sleepy to attempt further mobility                            Cognition Arousal/Alertness: Lethargic;Suspect due to medications Behavior During Therapy: Flat affect Overall Cognitive Status: Difficult to assess                      Exercises      General Comments        Pertinent Vitals/Pain Pain Assessment: 0-10 Pain Score: 7  Pain Location: right foot/ankle Pain Intervention(s): Limited activity within patient's tolerance;Monitored during session;Premedicated before session (notified Cary RN of pt's pain complaints and his drowsiness)    Home Living                      Prior Function            PT Goals (current goals can now be  found in the care plan section) Progress towards PT goals: Not progressing toward goals - comment (decreased level of alertness inhibits participation in PT)    Frequency  Min 5X/week    PT Plan Current plan remains appropriate    Co-evaluation             End of Session   Activity Tolerance: Patient limited by lethargy Patient left: in bed;with call bell/phone within reach;with nursing/sitter in room;with bed alarm set     Time: 9872-1587 PT Time Calculation (min) (ACUTE ONLY): 23 min  Charges:  $Therapeutic Activity: 23-37 mins                    G Codes:      Ladona Ridgel 04/17/2014, 12:00 PM Gerlean Ren PT Acute Rehab Services [place] [phone]

## 2014-04-17 NOTE — Progress Notes (Addendum)
Patient ID: Travis [name], male   DOB: 11/11/1950, 63 y.o.   MRN: [id number] Subjective: 2 Days Post-Op Procedure(s) (LRB): Right Subtalar, Talonavicular Fusion (Right) Awake, alert and oriented x 4. PT assess patient and deemed not satisfactory for independent home discharge, recommended SNF. Social service consulted for SNF short term placement. Patient reports pain as moderate.    Objective:   VITALS:  Temp:  [99.3 F (37.4 C)-100.8 F (38.2 C)] 99.5 F (37.5 C) (11/27 0524) Pulse Rate:  [85-90] 90 (11/27 0524) Resp:  [18] 18 (11/27 0524) BP: (93-111)/(44-58) 111/53 mmHg (11/27 0524) SpO2:  [96 %-99 %] 96 % (11/27 0524)  Neurologically intact ABD soft Neurovascular intact Sensation intact distally Intact pulses distally Dorsiflexion/Plantar flexion intact Incision: dressing C/D/I   LABS No results for input(s): HGB, WBC, PLT in the last 72 hours. No results for input(s): NA, K, CL, CO2, BUN, CREATININE, GLUCOSE in the last 72 hours.  Recent Labs  04/15/14 0715  INR 1.18     Assessment/Plan: 2 Days Post-Op Procedure(s) (LRB): Right Subtalar, Talonavicular Fusion (Right)  Advance diet Up with therapy D/C IV fluids Discharge to SNF Discharge summary in EMR. FL-2 form pending.  NITKA,JAMES E 04/17/2014, 9:04 AM

## 2014-04-17 NOTE — Clinical Social Work Note (Addendum)
Patient reports to CSW that he has been to [facility] in the past and would like to return for STR/SNF.  CSW has contacted SNF and requested patient be reviewed for possible admission.  Patient requests [facility] transportation once discharged.  CSW to arrange.  Nonnie Done, [place] [phone]  Psychiatric & Orthopedics (5N 1-16) Clinical Social Worker

## 2014-04-17 NOTE — Progress Notes (Signed)
Per day shift RN, rounding MD wrote orders for pt's discharge today (11/26). However, pt did not progress well with PT during afternoon session. PT recommending SNF placement as pt lives with his brother and is uncertain assistance will be adequate at this point in his recovery. Day shift RN paged on call MD (Dr. Louanne Skye) to make aware. MD canceled d/c orders. This RN will pass along to oncoming nursing staff (11/27).

## 2014-04-17 NOTE — Clinical Social Work Psychosocial (Signed)
Clinical Social Work Department BRIEF PSYCHOSOCIAL ASSESSMENT 04/17/2014  Patient:  Travis [name], Travis [name]     Account Number:  [id number]     Admit date:  04/15/2014  Clinical Social Worker:  Wylene Men  Date/Time:  04/17/2014 12:04 PM  Referred by:  Physician  Date Referred:  04/17/2014 Referred for  SNF Placement  Psychosocial assessment   Other Referral:   none   Interview type:  Patient Other interview type:   none    PSYCHOSOCIAL DATA Living Status:  SIBL[place] Admitted from facility:   Level of care:   Primary support name:  [name] Primary support relationship to patient:  SIBLING Degree of support available:   adequate    CURRENT CONCERNS Current Concerns  Post-Acute Placement   Other Concerns:   none    SOCIAL WORK ASSESSMENT / PLAN CSW assessed pt at bedside. PT is recommending SNF/STR at time of discharge (dc).  Patient is aware and is agreeable to this diposition. Patient states that prior to this hospital admission that he lives with his brother in [place]. Patient is optimisitc regarding his recovery and ability to return to independent level of care. Patient has no choice of SNF at this time.  CSW educated patient on SNF search process.  Patient is agreeable to SNF search of [facility].  CSW will provide patient with bed offers and continue to assist with disposition needs.   Assessment/plan status:  Psychosocial Support/Ongoing Assessment of Needs Other assessment/ plan:   FL2  PASARR   Information/referral to community resources:   SNF/STR    PATIENT'S/FAMILY'S RESPONSE TO PLAN OF CARE: patient is agreeable to SNF/STR search of [facility].       Nonnie Done, [place] [phone]  Psychiatric & Orthopedics (5N 1-16) Clinical Social Worker

## 2014-04-17 NOTE — Plan of Care (Signed)
Problem: Phase I Progression Outcomes Goal: Incision/dressings dry and intact Outcome: Completed/Met Date Met:  04/17/14 Goal: Sutures/staples intact Outcome: Completed/Met Date Met:  04/17/14 Goal: Voiding-avoid urinary catheter unless indicated Outcome: Completed/Met Date Met:  04/17/14

## 2014-04-20 ENCOUNTER — Encounter (HOSPITAL_COMMUNITY): Payer: Self-pay | Admitting: Orthopedic Surgery

## 2014-04-20 ENCOUNTER — Other Ambulatory Visit: Payer: Self-pay | Admitting: *Deleted

## 2014-04-20 MED ORDER — CLONAZEPAM 2 MG PO TABS: ORAL_TABLET | ORAL | Status: DC

## 2014-04-20 MED ORDER — OXYCODONE HCL 15 MG PO TABS: ORAL_TABLET | ORAL | Status: DC

## 2014-04-20 NOTE — Telephone Encounter (Signed)
[facility] 

## 2014-04-21 ENCOUNTER — Non-Acute Institutional Stay (SKILLED_NURSING_FACILITY): Payer: Commercial Managed Care - HMO | Admitting: Adult Health

## 2014-04-21 DIAGNOSIS — D62 Acute posthemorrhagic anemia: Secondary | ICD-10-CM

## 2014-04-21 DIAGNOSIS — J309 Allergic rhinitis, unspecified: Secondary | ICD-10-CM

## 2014-04-21 DIAGNOSIS — I2510 Atherosclerotic heart disease of native coronary artery without angina pectoris: Secondary | ICD-10-CM

## 2014-04-21 DIAGNOSIS — G894 Chronic pain syndrome: Secondary | ICD-10-CM

## 2014-04-21 DIAGNOSIS — F419 Anxiety disorder, unspecified: Secondary | ICD-10-CM

## 2014-04-21 DIAGNOSIS — F32A Depression, unspecified: Secondary | ICD-10-CM

## 2014-04-21 DIAGNOSIS — I10 Essential (primary) hypertension: Secondary | ICD-10-CM

## 2014-04-21 DIAGNOSIS — F329 Major depressive disorder, single episode, unspecified: Secondary | ICD-10-CM

## 2014-04-21 DIAGNOSIS — E118 Type 2 diabetes mellitus with unspecified complications: Secondary | ICD-10-CM

## 2014-04-21 DIAGNOSIS — E785 Hyperlipidemia, unspecified: Secondary | ICD-10-CM

## 2014-04-21 DIAGNOSIS — M19071 Primary osteoarthritis, right ankle and foot: Secondary | ICD-10-CM

## 2014-04-21 DIAGNOSIS — N400 Enlarged prostate without lower urinary tract symptoms: Secondary | ICD-10-CM

## 2014-04-21 DIAGNOSIS — G4700 Insomnia, unspecified: Secondary | ICD-10-CM

## 2014-04-22 ENCOUNTER — Non-Acute Institutional Stay (SKILLED_NURSING_FACILITY): Payer: Commercial Managed Care - HMO | Admitting: Internal Medicine

## 2014-04-22 DIAGNOSIS — I10 Essential (primary) hypertension: Secondary | ICD-10-CM

## 2014-04-22 DIAGNOSIS — M19071 Primary osteoarthritis, right ankle and foot: Secondary | ICD-10-CM

## 2014-04-22 DIAGNOSIS — F32A Depression, unspecified: Secondary | ICD-10-CM

## 2014-04-22 DIAGNOSIS — N400 Enlarged prostate without lower urinary tract symptoms: Secondary | ICD-10-CM

## 2014-04-22 DIAGNOSIS — F329 Major depressive disorder, single episode, unspecified: Secondary | ICD-10-CM

## 2014-04-22 DIAGNOSIS — K5900 Constipation, unspecified: Secondary | ICD-10-CM

## 2014-04-22 DIAGNOSIS — E118 Type 2 diabetes mellitus with unspecified complications: Secondary | ICD-10-CM

## 2014-04-22 NOTE — Progress Notes (Signed)
Patient ID: Travis [name], male   DOB: 12-Feb-1951, 63 y.o.   MRN: [id number]     [facility]   PCP: [name], MD  Code Status: full code  No Known Allergies  Chief Complaint  Patient presents with  . New Admit To SNF     HPI:  63 y/o male pt is here for STR post hospital admission from 04/15/14-04/17/14 with right foot DJD arthritis. He underwent Right Subtalar, Talonavicular Fusion on 04/15/2014. He is seen in his room today. His pain is under control. He had a bowel movement today. He denies any concerns. No new concern from staff.  Review of Systems:  Constitutional: Negative for fever, chills, diaphoresis.  HENT: Negative for congestion  Respiratory: Negative for cough, shortness of breath and wheezing.   Cardiovascular: Negative for chest pain, palpitations Gastrointestinal: Negative for heartburn, nausea, vomiting, abdominal pain Genitourinary: Negative for dysuria  Musculoskeletal: Negative for back pain, falls Skin: Negative for itching, rash.  Neurological: Negative for weakness,dizziness, headaches.  Psychiatric/Behavioral: Negative for depression  Past Medical History  Diagnosis Date  . Impaired glucose tolerance 09/27/2010  . HYPERLIPIDEMIA-MIXED 07/15/2009  . Chronic pain syndrome 10/27/2009  . HYPERTENSION 06/24/2009  . CORONARY ARTERY DISEASE 06/24/2009    a. s/p multiple PCIs - In 2008 he had a Taxus DES to the mild LAD, Endeavor DES to mid LCX and distal LCX. In January 2009 he had DES to distal LCX, mid LCX and proximal LCX. In November 2009 had BMS x 2 to the mid RCA. Cath 10/2011 with patent stents, noncardiac CP. LHC 01/2013: patent stents (noncardiac CP).  [name] URETHRAL STRICTURE 06/24/2009  . SHOULDER PAIN, BILATERAL 06/24/2009  . ANKLE PAIN, RIGHT 06/24/2009  . SCIATICA, LEFT 04/19/2010  . NEPHROLITHIASIS, HX OF 06/24/2009  . ERECTILE DYSFUNCTION, ORGANIC 05/30/2010  . Obesity   . ALLERGIC RHINITIS 06/24/2009  . Anxiety   .  Depression     Prior suicide attempt  . Irregular heart beat   . Pneumonia   . Self-catheterizes urinary bladder   . Anginal pain   . Type II diabetes mellitus   . Hepatitis C dx'd 1979  . DEGENERATIVE JOINT DISEASE 06/24/2009  . [place] DISEASE, LUMBAR 04/19/2010  . Arthritis     "all my joints" (09/30/2013)  . Fibromyalgia     "left leg" (09/30/2013)  . Chronic back pain     "my whole back" (09/30/2013)  . Kidney stones     "passed on my own"  . Myocardial infarction 2008  . OSA (obstructive sleep apnea)     not using CPAP (09/30/2013)  . Heart murmur    Past Surgical History  Procedure Laterality Date  . Shoulder open rotator cuff repair Right X 2  . Total knee arthroplasty Bilateral 2008  . Carpal tunnel release Bilateral   . Urethral dilation  X 4  . Knee arthroscopy Right X 7  . Umbilical hernia repair      UHR  . Hernia repair      umbilical  . Shoulder arthroscopy w/ rotator cuff repair Bilateral     "3on the left; 2 on the right"  . Tonsillectomy    . Knee cartilage surgery Right X 4    "open; not scopes"  . Knee cartilage surgery Left X 3    "open; not scopes"  . Coronary angioplasty with stent placement      "I have 9 stents"  . Cardiac catheterization  X 1  . Anterior  cervical decomp/discectomy fusion N/A 09/26/2013    Procedure: ANTERIOR CERVICAL DISCECTOMY FUSION C3-4, plate and screw fixation, allograft bone graft;  Surgeon: Jessy Oto, MD;  Location: [place];  Service: Orthopedics;  Laterality: N/A;  . Laminectomy  01/28/2014    L3 L4 L5  . Lumbar laminectomy/decompression microdiscectomy N/A 01/27/2014    Procedure: CENTRAL LUMBAR LAMINECTOMY L4-5 AND L3-4;  Surgeon: Jessy Oto, MD;  Location: [place];  Service: Orthopedics;  Laterality: N/A;  . Joint replacement Bilateral     knees  . Colonoscopy    . Vasectomy    . Fusion of talonavicular joint Right 04/15/2014    dr duda  . Ankle fusion Right 04/15/2014    Procedure: Right Subtalar, Talonavicular  Fusion;  Surgeon: Newt Minion, MD;  Location: [place];  Service: Orthopedics;  Laterality: Right;   Social History:   reports that he quit smoking about 3 years ago. His smoking use included Cigars. He has never used smokeless tobacco. He reports that he does not drink alcohol or use illicit drugs.  Family History  Problem Relation Age of Onset  . Depression Mother   . Heart disease Mother   . Hypertension Mother   . Cancer Mother     Breast  . Diabetes Father   . Heart disease Father     CABG  . Cancer Father     prostate and skin cancer  . Hypertension Father   . Hyperlipidemia Father   . Depression Brother     x 2  . Hypertension Brother     x2  . Coronary artery disease Other   . Hypertension Other   . Depression Other   . Heart disease Maternal Grandfather   . Early death Maternal Grandfather 45    heart attack  . Early death Paternal Grandfather     Medications: Patient's Medications  New Prescriptions   No medications on file  Previous Medications   AMLODIPINE (NORVASC) 5 MG TABLET    Take 1 tablet (5 mg total) by mouth daily.   ARIPIPRAZOLE (ABILIFY) 5 MG TABLET    TAKE 1 TABLET BY MOUTH DAILY   ASPIRIN 81 MG TABLET    Take 1 tablet (81 mg total) by mouth daily.   ATORVASTATIN (LIPITOR) 20 MG TABLET    Take 20 mg by mouth daily.   BENAZEPRIL (LOTENSIN) 20 MG TABLET    Take 1 tablet (20 mg total) by mouth daily. HOLD for next 2 days   CLONAZEPAM (KLONOPIN) 2 MG TABLET    Take 1 tablet (2 mg total) by mouth 2 (two) times daily as needed (sleep, anxiety).   CLONAZEPAM (KLONOPIN) 2 MG TABLET    Take one tablet by mouth twice daily as needed for anxiety   CYCLOBENZAPRINE (FLEXERIL) 5 MG TABLET    Take 1 tablet (5 mg total) by mouth 3 (three) times daily as needed for muscle spasms.   CYCLOBENZAPRINE (FLEXERIL) 5 MG TABLET    Take 1 tablet (5 mg total) by mouth 3 (three) times daily as needed for muscle spasms.   DIVALPROEX (DEPAKOTE) 250 MG DR TABLET    Take 1-2  tablets (250-500 mg total) by mouth 2 (two) times daily. 250 mg in morning and 500 mg at bedtime   DOCUSATE SODIUM 100 MG CAPS    Take 100 mg by mouth 2 (two) times daily.   FERROUS GLUCONATE (FERGON) 324 MG TABLET    Take 1 tablet (324 mg total) by mouth 3 (  three) times daily with meals.   FLUOXETINE (PROZAC) 40 MG CAPSULE    Take 40 mg by mouth daily.   FLUTICASONE (FLONASE) 50 MCG/ACT NASAL SPRAY    Place 1 spray into both nostrils daily.   GABAPENTIN (NEURONTIN) 400 MG CAPSULE    Take 800 mg by mouth 4 (four) times daily.   GABAPENTIN (NEURONTIN) 400 MG CAPSULE    Take 2 capsules (800 mg total) by mouth 4 (four) times daily.   ISOSORBIDE MONONITRATE (IMDUR) 60 MG 24 HR TABLET    Take 1 tablet (60 mg total) by mouth daily. HOLD for next 2 days   METFORMIN (GLUCOPHAGE) 500 MG TABLET    Take 500 mg by mouth 2 (two) times daily with a meal.    METHOCARBAMOL (ROBAXIN) 500 MG TABLET    Take 1 tablet (500 mg total) by mouth every 6 (six) hours as needed for muscle spasms.   OMEGA-3 FATTY ACIDS (FISH OIL) 1000 MG CAPS    Take 2 capsules by mouth daily.   OXYCODONE (ROXICODONE) 15 MG IMMEDIATE RELEASE TABLET    Take 1 tablet (15 mg total) by mouth every 4 (four) hours as needed.   OXYCODONE (ROXICODONE) 15 MG IMMEDIATE RELEASE TABLET    Take one tablet by mouth every 3 hours as needed for moderate pain; Take two tablets by mouth every 3 hours as needed for severe pain   POLYETHYLENE GLYCOL (MIRALAX / GLYCOLAX) PACKET    Take 17 g by mouth 2 (two) times daily.   POLYETHYLENE GLYCOL (MIRALAX / GLYCOLAX) PACKET    Take 17 g by mouth daily.   PRASUGREL (EFFIENT) 10 MG TABS TABLET    Take 1 tablet (10 mg total) by mouth daily.   SILDENAFIL (VIAGRA) 50 MG TABLET    Take 1 tablet (50 mg total) by mouth daily as needed for erectile dysfunction.   TAMSULOSIN (FLOMAX) 0.4 MG CAPS CAPSULE    Take 0.4 mg by mouth daily after breakfast.   TRAZODONE (DESYREL) 100 MG TABLET    TAKE 1 TABLET BY MOUTH EVERY NIGHT AT  BEDTIME   TRAZODONE (DESYREL) 100 MG TABLET    Take 1 tablet (100 mg total) by mouth at bedtime.  Modified Medications   No medications on file  Discontinued Medications   No medications on file     Physical Exam: Filed Vitals:   04/22/14 1524  BP: 134/78  Pulse: 72  Temp: 96.9 F (36.1 C)  Resp: 16  Weight: 253 lb 6.4 oz (114.941 kg)  SpO2: 96%    General- elderly male in no acute distress Head- atraumatic, normocephalic Neck- no cervical lymphadenopathy Cardiovascular- normal s1,s2, no murmurs Respiratory- bilateral clear to auscultation, no wheeze, no rhonchi Abdomen- bowel sounds present, soft, non tender Musculoskeletal- able to move all 4 extremities, no leg edema Neurological- no focal deficit Skin- warm and dry, right foot 5 incisions with sutures in place, healing well Psychiatry- alert, normal mood and affect   Labs reviewed: Basic Metabolic Panel:  Recent Labs  05/03/13 0052  01/29/14 0620 01/30/14 0559 04/07/14 1251  NA 142  < > 138 136* 139  K 3.6  < > 5.1 4.9 4.4  CL 107  < > 104 102 101  CO2 28  < > 24 25 26   GLUCOSE 130*  < > 156* 113* 84  BUN 20  < > 29* 18 12  CREATININE 0.82  < > 1.08 0.79 0.88  CALCIUM 8.3*  < > 8.2* 8.6 9.2  MG 1.9  --   --   --   --   < > = values in this interval not displayed. Liver Function Tests:  Recent Labs  05/03/13 0052 09/29/13 1410 04/07/14 1251  AST 13 43* 13  ALT 10 15 7   ALKPHOS 59 41 55  BILITOT 0.2* 0.7 0.4  PROT 6.0 6.3 6.7  ALBUMIN 3.2* 3.3* 3.9   No results for input(s): LIPASE, AMYLASE in the last 8760 hours. No results for input(s): AMMONIA in the last 8760 hours. CBC:  Recent Labs  09/29/13 1410  01/29/14 0620 01/30/14 0559 04/07/14 1251  WBC 4.8  < > 5.6 4.6 3.7*  NEUTROABS 3.1  --   --   --   --   HGB 11.3*  < > 8.6* 8.1* 12.6*  HCT 33.9*  < > 25.5* 23.7* 37.1*  MCV 93.1  < > 92.1 90.1 90.5  PLT 156  < > 148* 161 154  < > = values in this interval not displayed. Cardiac  Enzymes:  Recent Labs  01/28/14 1552  TROPONINI <0.30   BNP: Invalid input(s): POCBNP CBG:  Recent Labs  04/16/14 2120 04/17/14 0640 04/17/14 1214  GLUCAP 136* 131* 145*    Assessment/Plan  Right foot DJD S/p fusion, has short leg splint, has f/u with Dr Sharol Given. Continue oxycodone and neuronitn. Off flexeril and on prn robaxin for muscle spasm. Monitor. Will have patient work with PT/OT as tolerated to regain strength and restore function.  Fall precautions are in place.  HTN Continue amlodipine and benazepril, monitor bp  Dm type 2 Monitor cbg, continue metformin  Constipation Regular bowel movement. Continue miralax and docusate  BPH Continue his flomax  Depression Mood stable. Continue abilify, klonopin, prozac and depakote    Goals of care: short term rehabilitation   Blanchie Serve, MD  [facility] [phone] (Monday-Friday 8 am - 5 pm) [phone] (afterhours)

## 2014-04-23 ENCOUNTER — Emergency Department (HOSPITAL_COMMUNITY)
Admission: EM | Admit: 2014-04-23 | Discharge: 2014-04-23 | Disposition: A | Discharge status: Home / Self Care | Payer: Commercial Managed Care - HMO | Attending: Emergency Medicine | Admitting: Emergency Medicine

## 2014-04-23 ENCOUNTER — Other Ambulatory Visit: Payer: Self-pay

## 2014-04-23 ENCOUNTER — Emergency Department (HOSPITAL_COMMUNITY): Payer: Commercial Managed Care - HMO

## 2014-04-23 ENCOUNTER — Encounter (HOSPITAL_COMMUNITY): Payer: Self-pay | Admitting: Emergency Medicine

## 2014-04-23 DIAGNOSIS — W06XXXA Fall from bed, initial encounter: Secondary | ICD-10-CM | POA: Insufficient documentation

## 2014-04-23 DIAGNOSIS — S3992XA Unspecified injury of lower back, initial encounter: Secondary | ICD-10-CM | POA: Insufficient documentation

## 2014-04-23 DIAGNOSIS — E119 Type 2 diabetes mellitus without complications: Secondary | ICD-10-CM | POA: Insufficient documentation

## 2014-04-23 DIAGNOSIS — Z8701 Personal history of pneumonia (recurrent): Secondary | ICD-10-CM | POA: Insufficient documentation

## 2014-04-23 DIAGNOSIS — S0990XA Unspecified injury of head, initial encounter: Secondary | ICD-10-CM | POA: Insufficient documentation

## 2014-04-23 DIAGNOSIS — E782 Mixed hyperlipidemia: Secondary | ICD-10-CM | POA: Insufficient documentation

## 2014-04-23 DIAGNOSIS — Z7982 Long term (current) use of aspirin: Secondary | ICD-10-CM | POA: Insufficient documentation

## 2014-04-23 DIAGNOSIS — Z79899 Other long term (current) drug therapy: Secondary | ICD-10-CM | POA: Insufficient documentation

## 2014-04-23 DIAGNOSIS — I10 Essential (primary) hypertension: Secondary | ICD-10-CM | POA: Insufficient documentation

## 2014-04-23 DIAGNOSIS — Z87891 Personal history of nicotine dependence: Secondary | ICD-10-CM | POA: Diagnosis not present

## 2014-04-23 DIAGNOSIS — Y998 Other external cause status: Secondary | ICD-10-CM | POA: Insufficient documentation

## 2014-04-23 DIAGNOSIS — M791 Myalgia, unspecified site: Secondary | ICD-10-CM

## 2014-04-23 DIAGNOSIS — N529 Male erectile dysfunction, unspecified: Secondary | ICD-10-CM | POA: Diagnosis not present

## 2014-04-23 DIAGNOSIS — Z8619 Personal history of other infectious and parasitic diseases: Secondary | ICD-10-CM | POA: Insufficient documentation

## 2014-04-23 DIAGNOSIS — M1990 Unspecified osteoarthritis, unspecified site: Secondary | ICD-10-CM | POA: Insufficient documentation

## 2014-04-23 DIAGNOSIS — S79911A Unspecified injury of right hip, initial encounter: Secondary | ICD-10-CM | POA: Insufficient documentation

## 2014-04-23 DIAGNOSIS — Y92122 Bedroom in nursing home as the place of occurrence of the external cause: Secondary | ICD-10-CM | POA: Insufficient documentation

## 2014-04-23 DIAGNOSIS — Z9861 Coronary angioplasty status: Secondary | ICD-10-CM | POA: Diagnosis not present

## 2014-04-23 DIAGNOSIS — I25119 Atherosclerotic heart disease of native coronary artery with unspecified angina pectoris: Secondary | ICD-10-CM | POA: Diagnosis not present

## 2014-04-23 DIAGNOSIS — F329 Major depressive disorder, single episode, unspecified: Secondary | ICD-10-CM | POA: Diagnosis not present

## 2014-04-23 DIAGNOSIS — Y9384 Activity, sleeping: Secondary | ICD-10-CM | POA: Diagnosis not present

## 2014-04-23 DIAGNOSIS — G894 Chronic pain syndrome: Secondary | ICD-10-CM | POA: Diagnosis not present

## 2014-04-23 DIAGNOSIS — M797 Fibromyalgia: Secondary | ICD-10-CM | POA: Diagnosis not present

## 2014-04-23 DIAGNOSIS — F419 Anxiety disorder, unspecified: Secondary | ICD-10-CM | POA: Diagnosis not present

## 2014-04-23 DIAGNOSIS — E669 Obesity, unspecified: Secondary | ICD-10-CM | POA: Diagnosis not present

## 2014-04-23 DIAGNOSIS — Z9889 Other specified postprocedural states: Secondary | ICD-10-CM | POA: Insufficient documentation

## 2014-04-23 DIAGNOSIS — Y92129 Unspecified place in nursing home as the place of occurrence of the external cause: Secondary | ICD-10-CM

## 2014-04-23 DIAGNOSIS — R011 Cardiac murmur, unspecified: Secondary | ICD-10-CM | POA: Diagnosis not present

## 2014-04-23 DIAGNOSIS — W19XXXA Unspecified fall, initial encounter: Secondary | ICD-10-CM

## 2014-04-23 DIAGNOSIS — I252 Old myocardial infarction: Secondary | ICD-10-CM | POA: Insufficient documentation

## 2014-04-23 NOTE — ED Provider Notes (Signed)
Medical screening examination/treatment/procedure(s) were conducted as a shared visit with non-physician practitioner(s) and myself.  I personally evaluated the patient during the encounter.   EKG Interpretation None      Pt is a 63 y.o. M who had a fall out of bed today. Bedridden since recent ankle surgery. Patient complaining of back pain, hip pain, headache. Imaging has been unremarkable. Neurologically intact. The time I have seen patient he reports he is feeling much better. We'll discharge home. Discussed head injury return precautions.  [place], DO 04/24/14 [phone]

## 2014-04-23 NOTE — ED Notes (Signed)
[facility] here to transport patient

## 2014-04-23 NOTE — ED Provider Notes (Signed)
CSN: [id number]     Arrival date & time 04/23/14  1916 History   First MD Initiated Contact with Patient 04/23/14 1946     Chief Complaint  Patient presents with  . Fall     (Consider location/radiation/quality/duration/timing/severity/associated sxs/prior Treatment) Patient is a 63 y.o. male presenting with fall. The history is provided by the patient. No language interpreter was used.  Fall This is a new problem. The current episode started today. Associated symptoms include headaches. Pertinent negatives include no fever. Associated symptoms comments: The patient is here for evaluation after a fall from his bed, where he has been bedridden since recent ankle fusion surgery. He is at [facility] for rehab of the orthopedic surgery. Tonight he reports he was asleep when rolled over and fell onto the floor, hitting his head on the bedside furniture. He complains of back pain, right hip and thigh pain and headache. No vomiting. .    Past Medical History  Diagnosis Date  . Impaired glucose tolerance 09/27/2010  . HYPERLIPIDEMIA-MIXED 07/15/2009  . Chronic pain syndrome 10/27/2009  . HYPERTENSION 06/24/2009  . CORONARY ARTERY DISEASE 06/24/2009    a. s/p multiple PCIs - In 2008 he had a Taxus DES to the mild LAD, Endeavor DES to mid LCX and distal LCX. In January 2009 he had DES to distal LCX, mid LCX and proximal LCX. In November 2009 had BMS x 2 to the mid RCA. Cath 10/2011 with patent stents, noncardiac CP. LHC 01/2013: patent stents (noncardiac CP).  [name] URETHRAL STRICTURE 06/24/2009  . SHOULDER PAIN, BILATERAL 06/24/2009  . ANKLE PAIN, RIGHT 06/24/2009  . SCIATICA, LEFT 04/19/2010  . NEPHROLITHIASIS, HX OF 06/24/2009  . ERECTILE DYSFUNCTION, ORGANIC 05/30/2010  . Obesity   . ALLERGIC RHINITIS 06/24/2009  . Anxiety   . Depression     Prior suicide attempt  . Irregular heart beat   . Pneumonia   . Self-catheterizes urinary bladder   . Anginal pain   . Type II diabetes mellitus   . Hepatitis C dx'd  1979  . DEGENERATIVE JOINT DISEASE 06/24/2009  . [place] DISEASE, LUMBAR 04/19/2010  . Arthritis     "all my joints" (09/30/2013)  . Fibromyalgia     "left leg" (09/30/2013)  . Chronic back pain     "my whole back" (09/30/2013)  . Kidney stones     "passed on my own"  . Myocardial infarction 2008  . OSA (obstructive sleep apnea)     not using CPAP (09/30/2013)  . Heart murmur    Past Surgical History  Procedure Laterality Date  . Shoulder open rotator cuff repair Right X 2  . Total knee arthroplasty Bilateral 2008  . Carpal tunnel release Bilateral   . Urethral dilation  X 4  . Knee arthroscopy Right X 7  . Umbilical hernia repair      UHR  . Hernia repair      umbilical  . Shoulder arthroscopy w/ rotator cuff repair Bilateral     "3on the left; 2 on the right"  . Tonsillectomy    . Knee cartilage surgery Right X 4    "open; not scopes"  . Knee cartilage surgery Left X 3    "open; not scopes"  . Coronary angioplasty with stent placement      "I have 9 stents"  . Cardiac catheterization  X 1  . Anterior cervical decomp/discectomy fusion N/A 09/26/2013    Procedure: ANTERIOR CERVICAL DISCECTOMY FUSION C3-4, plate and screw fixation, allograft bone  graft;  Surgeon: Jessy Oto, MD;  Location: [place];  Service: Orthopedics;  Laterality: N/A;  . Laminectomy  01/28/2014    L3 L4 L5  . Lumbar laminectomy/decompression microdiscectomy N/A 01/27/2014    Procedure: CENTRAL LUMBAR LAMINECTOMY L4-5 AND L3-4;  Surgeon: Jessy Oto, MD;  Location: [place];  Service: Orthopedics;  Laterality: N/A;  . Joint replacement Bilateral     knees  . Colonoscopy    . Vasectomy    . Fusion of talonavicular joint Right 04/15/2014    dr duda  . Ankle fusion Right 04/15/2014    Procedure: Right Subtalar, Talonavicular Fusion;  Surgeon: Newt Minion, MD;  Location: [place];  Service: Orthopedics;  Laterality: Right;   Family History  Problem Relation Age of Onset  . Depression Mother   . Heart disease  Mother   . Hypertension Mother   . Cancer Mother     Breast  . Diabetes Father   . Heart disease Father     CABG  . Cancer Father     prostate and skin cancer  . Hypertension Father   . Hyperlipidemia Father   . Depression Brother     x 2  . Hypertension Brother     x2  . Coronary artery disease Other   . Hypertension Other   . Depression Other   . Heart disease Maternal Grandfather   . Early death Maternal Grandfather 41    heart attack  . Early death Paternal Grandfather    History  Substance Use Topics  . Smoking status: Former Smoker    Types: Cigars    Quit date: 08/28/2010  . Smokeless tobacco: Never Used     Comment: 09/30/2013 "smoked 1 cigar/wk when I did smoke"  . Alcohol Use: No     Comment: rare (4 beers all summer)  None at all now (03/2014)    Review of Systems  Constitutional: Negative for fever.  Respiratory: Negative.   Cardiovascular: Negative.   Gastrointestinal: Negative.   Musculoskeletal: Positive for back pain.       See HPI.  Skin: Negative for wound.  Neurological: Positive for headaches.      Allergies  Review of patient's allergies indicates no known allergies.  Home Medications   Prior to Admission medications   Medication Sig Start Date End Date Taking? Authorizing Provider  amLODipine (NORVASC) 5 MG tablet Take 1 tablet (5 mg total) by mouth daily. 01/31/14   Ripudeep Krystal Eaton, MD  ARIPiprazole (ABILIFY) 5 MG tablet TAKE 1 TABLET BY MOUTH DAILY 02/20/14   Kathlee Nations, MD  aspirin 81 MG tablet Take 1 tablet (81 mg total) by mouth daily. 04/17/14   Jessy Oto, MD  atorvastatin (LIPITOR) 20 MG tablet Take 20 mg by mouth daily.    Historical Provider, MD  benazepril (LOTENSIN) 20 MG tablet Take 1 tablet (20 mg total) by mouth daily. HOLD for next 2 days 01/31/14   Ripudeep Krystal Eaton, MD  clonazePAM (KLONOPIN) 2 MG tablet Take 1 tablet (2 mg total) by mouth 2 (two) times daily as needed (sleep, anxiety). 04/17/14   Jessy Oto, MD   clonazePAM Bobbye [place]) 2 MG tablet Take one tablet by mouth twice daily as needed for anxiety 04/20/14   Tiffany L Reed, DO  cyclobenzaprine (FLEXERIL) 5 MG tablet Take 1 tablet (5 mg total) by mouth 3 (three) times daily as needed for muscle spasms. 10/15/13   [name], MD  cyclobenzaprine (FLEXERIL) 5  MG tablet Take 1 tablet (5 mg total) by mouth 3 (three) times daily as needed for muscle spasms. 04/17/14   Jessy Oto, MD  divalproex (DEPAKOTE) 250 MG DR tablet Take 1-2 tablets (250-500 mg total) by mouth 2 (two) times daily. 250 mg in morning and 500 mg at bedtime 04/17/14   Jessy Oto, MD  docusate sodium 100 MG CAPS Take 100 mg by mouth 2 (two) times daily. 04/17/14   Jessy Oto, MD  ferrous gluconate (FERGON) 324 MG tablet Take 1 tablet (324 mg total) by mouth 3 (three) times daily with meals. 01/31/14   Mcarthur Rossetti, MD  FLUoxetine (PROZAC) 40 MG capsule Take 40 mg by mouth daily. 01/13/14   Kathlee Nations, MD  fluticasone (FLONASE) 50 MCG/ACT nasal spray Place 1 spray into both nostrils daily.    Historical Provider, MD  gabapentin (NEURONTIN) 400 MG capsule Take 800 mg by mouth 4 (four) times daily. 06/28/12   Waylan Boga, NP  gabapentin (NEURONTIN) 400 MG capsule Take 2 capsules (800 mg total) by mouth 4 (four) times daily. 04/17/14   Jessy Oto, MD  isosorbide mononitrate (IMDUR) 60 MG 24 hr tablet Take 1 tablet (60 mg total) by mouth daily. HOLD for next 2 days 01/31/14   Ripudeep Krystal Eaton, MD  metFORMIN (GLUCOPHAGE) 500 MG tablet Take 500 mg by mouth 2 (two) times daily with a meal.     Historical Provider, MD  methocarbamol (ROBAXIN) 500 MG tablet Take 1 tablet (500 mg total) by mouth every 6 (six) hours as needed for muscle spasms. 04/17/14   Jessy Oto, MD  Omega-3 Fatty Acids (FISH OIL) 1000 MG CAPS Take 2 capsules by mouth daily.    Historical Provider, MD  oxyCODONE (ROXICODONE) 15 MG immediate release tablet Take 1 tablet (15 mg total) by mouth every 4 (four)  hours as needed. 04/17/14   Jessy Oto, MD  oxyCODONE (ROXICODONE) 15 MG immediate release tablet Take one tablet by mouth every 3 hours as needed for moderate pain; Take two tablets by mouth every 3 hours as needed for severe pain 04/20/14   Tiffany L Reed, DO  polyethylene glycol (MIRALAX / GLYCOLAX) packet Take 17 g by mouth 2 (two) times daily. Patient taking differently: Take 17 g by mouth daily.  01/31/14   Ripudeep Krystal Eaton, MD  polyethylene glycol (MIRALAX / GLYCOLAX) packet Take 17 g by mouth daily. 04/17/14   Jessy Oto, MD  prasugrel (EFFIENT) 10 MG TABS tablet Take 1 tablet (10 mg total) by mouth daily. 02/03/14   Epimenio Foot, PA-C  sildenafil (VIAGRA) 50 MG tablet Take 1 tablet (50 mg total) by mouth daily as needed for erectile dysfunction. 09/03/13   Theodis Blaze, MD  tamsulosin [facility]) 0.4 MG CAPS capsule Take 0.4 mg by mouth daily after breakfast. 10/15/13   [name], MD  traZODone (DESYREL) 100 MG tablet TAKE 1 TABLET BY MOUTH EVERY NIGHT AT BEDTIME 02/13/14   Kathlee Nations, MD  traZODone (DESYREL) 100 MG tablet Take 1 tablet (100 mg total) by mouth at bedtime. 04/17/14   Jessy Oto, MD   BP 120/68 mmHg  Pulse 54  Temp(Src) 98.1 F (36.7 C) (Oral)  Resp 16  SpO2 100% Physical Exam  Constitutional: He is oriented to person, place, and time. He appears well-developed and well-nourished. No distress.  HENT:  Head: Normocephalic and atraumatic.  Eyes: Conjunctivae are normal.  Neck: Normal range of  motion.  Cardiovascular: Normal rate.   No murmur heard. Pulmonary/Chest: Effort normal. He has no wheezes. He has no rales. He exhibits no tenderness.  Abdominal: Soft. There is no tenderness. There is no rebound and no guarding.  Musculoskeletal:  Moves all extremities. No midline cervical tenderness. Lower back mild tenderness. Right hip tender.   Neurological: He is alert and oriented to person, place, and time. Coordination normal.  Skin:  No bruising, swelling,  hematoma.  Psychiatric: He has a normal mood and affect.    ED Course  Procedures (including critical care time) Labs Review Labs Reviewed - No data to display  Imaging Review Dg Lumbar Spine Complete  04/23/2014   CLINICAL DATA:  Status post fall at nursing home on 04/23/2014. Initial encounter. Low back pain.  EXAM: LUMBAR SPINE - COMPLETE 4+ VIEW  COMPARISON:  None.  FINDINGS: There are 5 nonrib bearing lumbar-type vertebral bodies. The vertebral body heights are maintained. The alignment is anatomic. There is no spondylolysis. There is no acute fracture or static listhesis. There is degenerative disc disease at L1-2, L2-3 and L5-S1. There is bilateral facet arthropathy at L3-4, L4-5 and L5-S1.  The SI joints are unremarkable.  IMPRESSION: No acute osseous injury of the lumbar spine.   Electronically Signed   By: Kathreen Devoid   On: 04/23/2014 21:28   Dg Pelvis 1-2 Views  04/23/2014   CLINICAL DATA:  Right lower extremity pain after fall at nursing home, initial encounter.  EXAM: PELVIS - 1-2 VIEW  COMPARISON:  March 13, 2013.  FINDINGS: There is no evidence of pelvic fracture or diastasis. No pelvic bone lesions are seen. Mild degenerative disease is seen involving both hip joints.  IMPRESSION: Mild degenerative joint disease of both hips. No fracture or dislocation is noted.   Electronically Signed   By: Sabino Dick M.D.   On: 04/23/2014 21:29   Dg Femur Right  04/23/2014   CLINICAL DATA:  Status post fall at nursing home. Initial encounter.  EXAM: RIGHT FEMUR - 2 VIEW  COMPARISON:  None.  FINDINGS: No acute fracture or dislocation. Total right knee arthroplasty. Moderate osteoarthritis of the right hip. No lytic or sclerotic osseous lesion. Unremarkable soft tissues.  IMPRESSION: 1. No acute osseous injury of the right femur. 2. Moderate osteoarthritis of the right hip.   Electronically Signed   By: Kathreen Devoid   On: 04/23/2014 21:30   Ct Head Wo Contrast  04/23/2014   CLINICAL DATA:   Initial evaluation for acute trauma, fall.  EXAM: CT HEAD WITHOUT CONTRAST  CT CERVICAL SPINE WITHOUT CONTRAST  TECHNIQUE: Multidetector CT imaging of the head and cervical spine was performed following the standard protocol without intravenous contrast. Multiplanar CT image reconstructions of the cervical spine were also generated.  COMPARISON:  Prior study from 09/02/2013.  FINDINGS: CT HEAD FINDINGS  Mild diffuse prominence of the CSF containing spaces is compatible with generalized cerebral atrophy, normal for patient age. Minimal hypodensity within the periventricular and deep white matter both cerebral hemispheres is consistent with mild chronic small vessel ischemic changes.  There is no acute intracranial hemorrhage or infarct. No mass lesion or midline shift. Gray-white matter differentiation is well maintained. Ventricles are normal in size without evidence of hydrocephalus. CSF containing spaces are within normal limits. No extra-axial fluid collection.  The calvarium is intact.  Orbital soft tissues are within normal limits.  There is partial opacification of the sphenoid sinus and ethmoidal air cells. No mastoid effusion.  Scalp  soft tissues are unremarkable.  CT CERVICAL SPINE FINDINGS  The patient is status post ACDF at the C3-C4 level. Hardware is stable in alignment without evidence of failure or complication.  Vertebral bodies are normally aligned with preservation of the normal cervical lordosis. Normal C1-2 articulations are intact. Prevertebral soft tissues are normal. No acute fracture listhesis.  Moderate multilevel degenerative disc disease is evidenced by intervertebral disc space narrowing, endplate sclerosis, and osteophytosis seen throughout the cervical spine, most severe at C6-7.  Visualized soft tissues of the neck are within normal limits.  Visualized lung apices are clear.  IMPRESSION: CT BRAIN:  1. No intracranial process. 2. Moderate sphenoethmoidal sinus disease.  CT CERVICAL  SPINE:  1. No acute traumatic injury within the cervical spine. 2. ACDF at X3-2 without complication.   Electronically Signed   By: Jeannine Boga M.D.   On: 04/23/2014 21:32   Ct Cervical Spine Wo Contrast  04/23/2014   CLINICAL DATA:  Initial evaluation for acute trauma, fall.  EXAM: CT HEAD WITHOUT CONTRAST  CT CERVICAL SPINE WITHOUT CONTRAST  TECHNIQUE: Multidetector CT imaging of the head and cervical spine was performed following the standard protocol without intravenous contrast. Multiplanar CT image reconstructions of the cervical spine were also generated.  COMPARISON:  Prior study from 09/02/2013.  FINDINGS: CT HEAD FINDINGS  Mild diffuse prominence of the CSF containing spaces is compatible with generalized cerebral atrophy, normal for patient age. Minimal hypodensity within the periventricular and deep white matter both cerebral hemispheres is consistent with mild chronic small vessel ischemic changes.  There is no acute intracranial hemorrhage or infarct. No mass lesion or midline shift. Gray-white matter differentiation is well maintained. Ventricles are normal in size without evidence of hydrocephalus. CSF containing spaces are within normal limits. No extra-axial fluid collection.  The calvarium is intact.  Orbital soft tissues are within normal limits.  There is partial opacification of the sphenoid sinus and ethmoidal air cells. No mastoid effusion.  Scalp soft tissues are unremarkable.  CT CERVICAL SPINE FINDINGS  The patient is status post ACDF at the C3-C4 level. Hardware is stable in alignment without evidence of failure or complication.  Vertebral bodies are normally aligned with preservation of the normal cervical lordosis. Normal C1-2 articulations are intact. Prevertebral soft tissues are normal. No acute fracture listhesis.  Moderate multilevel degenerative disc disease is evidenced by intervertebral disc space narrowing, endplate sclerosis, and osteophytosis seen throughout the  cervical spine, most severe at C6-7.  Visualized soft tissues of the neck are within normal limits.  Visualized lung apices are clear.  IMPRESSION: CT BRAIN:  1. No intracranial process. 2. Moderate sphenoethmoidal sinus disease.  CT CERVICAL SPINE:  1. No acute traumatic injury within the cervical spine. 2. ACDF at G4-0 without complication.   Electronically Signed   By: Jeannine Boga M.D.   On: 04/23/2014 21:32     EKG Interpretation None      MDM   Final diagnoses:  Fall at nursing home, initial encounter    1. Fall 2. Muscular soreness  No evidence bony or internal injury. He is feeling improved. Initially he was somnolent secondary to pain medication received prior to arrival. On re-evaluation, he is awake, confirms history, is able to discuss evaluation and care plan. VSS. He is stable for discharge back to [place].     Dewaine Oats, PA-C 04/23/14 2235

## 2014-04-23 NOTE — Discharge Instructions (Signed)
Cryotherapy Cryotherapy means treatment with cold. Ice or gel packs can be used to reduce both pain and swelling. Ice is the most helpful within the first 24 to 48 hours after an injury or flare-up from overusing a muscle or joint. Sprains, strains, spasms, burning pain, shooting pain, and aches can all be eased with ice. Ice can also be used when recovering from surgery. Ice is effective, has very few side effects, and is safe for most people to use. PRECAUTIONS  Ice is not a safe treatment option for people with:  Raynaud phenomenon. This is a condition affecting small blood vessels in the extremities. Exposure to cold may cause your problems to return.  Cold hypersensitivity. There are many forms of cold hypersensitivity, including:  Cold urticaria. Red, itchy hives appear on the skin when the tissues begin to warm after being iced.  Cold erythema. This is a red, itchy rash caused by exposure to cold.  Cold hemoglobinuria. Red blood cells break down when the tissues begin to warm after being iced. The hemoglobin that carry oxygen are passed into the urine because they cannot combine with blood proteins fast enough.  Numbness or altered sensitivity in the area being iced. If you have any of the following conditions, do not use ice until you have discussed cryotherapy with your caregiver:  Heart conditions, such as arrhythmia, angina, or chronic heart disease.  High blood pressure.  Healing wounds or open skin in the area being iced.  Current infections.  Rheumatoid arthritis.  Poor circulation.  Diabetes. Ice slows the blood flow in the region it is applied. This is beneficial when trying to stop inflamed tissues from spreading irritating chemicals to surrounding tissues. However, if you expose your skin to cold temperatures for too long or without the proper protection, you can damage your skin or nerves. Watch for signs of skin damage due to cold. HOME CARE INSTRUCTIONS Follow  these tips to use ice and cold packs safely.  Place a dry or damp towel between the ice and skin. A damp towel will cool the skin more quickly, so you may need to shorten the time that the ice is used.  For a more rapid response, add gentle compression to the ice.  Ice for no more than 10 to 20 minutes at a time. The bonier the area you are icing, the less time it will take to get the benefits of ice.  Check your skin after 5 minutes to make sure there are no signs of a poor response to cold or skin damage.  Rest 20 minutes or more between uses.  Once your skin is numb, you can end your treatment. You can test numbness by very lightly touching your skin. The touch should be so light that you do not see the skin dimple from the pressure of your fingertip. When using ice, most people will feel these normal sensations in this order: cold, burning, aching, and numbness.  Do not use ice on someone who cannot communicate their responses to pain, such as small children or people with dementia. HOW TO MAKE AN ICE PACK Ice packs are the most common way to use ice therapy. Other methods include ice massage, ice baths, and cryosprays. Muscle creams that cause a cold, tingly feeling do not offer the same benefits that ice offers and should not be used as a substitute unless recommended by your caregiver. To make an ice pack, do one of the following:  Place crushed ice or a  bag of frozen vegetables in a sealable plastic bag. Squeeze out the excess air. Place this bag inside another plastic bag. Slide the bag into a pillowcase or place a damp towel between your skin and the bag.  Mix 3 parts water with 1 part rubbing alcohol. Freeze the mixture in a sealable plastic bag. When you remove the mixture from the freezer, it will be slushy. Squeeze out the excess air. Place this bag inside another plastic bag. Slide the bag into a pillowcase or place a damp towel between your skin and the bag. SEEK MEDICAL CARE  IF:  You develop white spots on your skin. This may give the skin a blotchy (mottled) appearance.  Your skin turns blue or pale.  Your skin becomes waxy or hard.  Your swelling gets worse. MAKE SURE YOU:   Understand these instructions.  Will watch your condition.  Will get help right away if you are not doing well or get worse. Document Released: 01/02/2011 Document Revised: 09/22/2013 Document Reviewed: 01/02/2011 [facility] Patient Information 2015 [place], [place]. This information is not intended to replace advice given to you by your health care provider. Make sure you discuss any questions you have with your health care provider.

## 2014-04-23 NOTE — ED Notes (Addendum)
Pt arrives via [facility], pt at [facility] for rehab after rt heel fusion. Pt reports falling out of bed onto tile floor, reports pain in rt hip/rt thigh. Given 48m oxycodone at 1800 by rehab facility and 50 mcg of fentanyl as well as 5043mNS bolus via [facility] prior to arrival.

## 2014-04-23 NOTE — ED Notes (Signed)
[facility] contacted for transportation of patient back to rehab facility.

## 2014-04-27 ENCOUNTER — Other Ambulatory Visit: Payer: Self-pay | Admitting: *Deleted

## 2014-04-27 MED ORDER — OXYCODONE HCL 15 MG PO TABS: ORAL_TABLET | ORAL | Status: DC

## 2014-04-27 NOTE — Telephone Encounter (Signed)
[facility] 

## 2014-04-30 ENCOUNTER — Ambulatory Visit (INDEPENDENT_AMBULATORY_CARE_PROVIDER_SITE_OTHER): Payer: Medicare HMO | Admitting: Psychiatry

## 2014-04-30 ENCOUNTER — Encounter (HOSPITAL_COMMUNITY): Payer: Self-pay | Admitting: Cardiovascular Disease

## 2014-04-30 VITALS — BP 96/50 | HR 86 | Ht 73.0 in | Wt 251.0 lb

## 2014-04-30 DIAGNOSIS — F319 Bipolar disorder, unspecified: Secondary | ICD-10-CM

## 2014-04-30 DIAGNOSIS — F39 Unspecified mood [affective] disorder: Secondary | ICD-10-CM

## 2014-04-30 MED ORDER — FLUOXETINE HCL 40 MG PO CAPS: 40.0000 mg | ORAL_CAPSULE | Freq: Every day | ORAL | Status: DC

## 2014-04-30 MED ORDER — ARIPIPRAZOLE 5 MG PO TABS: 5.0000 mg | ORAL_TABLET | Freq: Every day | ORAL | Status: DC

## 2014-04-30 MED ORDER — TRAZODONE HCL 100 MG PO TABS: 100.0000 mg | ORAL_TABLET | Freq: Every day | ORAL | Status: DC

## 2014-04-30 NOTE — Progress Notes (Signed)
Travis [name] [phone] Progress Note  Travis [name] [id number] 63 y.o.  04/30/2014 4:45 PM  Chief Complaint:  I had back surgery last month.  I'm feeling better.    History of Present Illness:  Travis [name] came for his followup appointment.  He was last seen in August.  He has a surgery 2 months ago and recently he need another bone grafting in his ankle.  He is using wheelchair because he cannot walk.  He is taking his medication and denies any side effects.  He continues to have some time irritability and anger but he feels the medicine working very well.  I for the surgery he is staying at a rehabilitation center.  However he is very close to his brother these days.  He is compliant with trazodone, Abilify and Prozac.  He is also taking multiple medication which are prescribed for his chronic pain .  He does not feel that his back surgery did very well because he continues to have pain in his back but overall he feels sleep is improved .  Denies any paranoia, hallucination, anger or any rage.  He has not seen Dr. Cheryln Manly in a while.  His appetite is okay.  He has lost a lot of weight because he changes in diet.  Patient denies any tremors or shakes.  He denies drinking or using any illegal substances.    Suicidal Ideation: No Plan Formed: No Patient has means to carry out plan: No  Homicidal Ideation: No Plan Formed: No Patient has means to carry out plan: No  Past Psychiatric History/Hospitalization(s) Patient has history of depression and mania and start seeing psychiatrist since age 83 .  He has seen psychiatrist in [place] , this constant and [place] in the past.  In the past he had tried Effexor, Xanax, Klonopin, Ambien , Lunesta, Wellbutrin, Ambien and Remeron.  He was started on Abilify from this office. Patient endorses history of mood swings, auditory hallucination, irritability, poor impulse control and risky behavior.  He spent $13,000 on his friend which was given to him to  use only as needed.  He has one psychiatric hospitalization in February 2014 when he took overdose on Xanax with alcohol.  As per chart there is a history of at least 2 suicidal gestures in the past.  Patient has done at least twice intensive outpatient program. Anxiety: Yes Bipolar Disorder: Yes Depression: Yes Mania: Yes Psychosis: Yes Schizophrenia: No Personality Disorder: No Hospitalization for psychiatric illness: Yes History of Electroconvulsive Shock Therapy: No Prior Suicide Attempts: Yes  Medical History; Patient has hypertension, coronary artery disease, allergic rhinitis, type 2 diabetes mellitus, left leg pain, degenerative joint disease, chronic back pain, urethral stricture, and about his function, hyperlipidemia, nephrolithiasis and chronic fatigue.  His primary care physician is Dr. Kandee Keen.  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  He spent a good amount of time in [place].  He moved back to [place].  He married 5 times.  He has 2 children however he has no contact with them.    Review of Systems  Constitutional: Positive for weight loss.  Musculoskeletal: Positive for back pain, joint pain and neck pain.  Skin: Negative for itching and rash.    Psychiatric: Agitation: No Hallucination: No Depressed Mood: No Insomnia: No Hypersomnia: No Altered Concentration: No Feels Worthless: No Grandiose Ideas: No Belief In Special Powers: No New/Increased Substance Abuse: No Compulsions: No  Neurologic: Headache: No Seizure:  No Paresthesias: Yes    Outpatient Encounter Prescriptions as of 04/30/2014  Medication Sig  . amLODipine (NORVASC) 5 MG tablet Take 1 tablet (5 mg total) by mouth daily.  . ARIPiprazole (ABILIFY) 5 MG tablet Take 1 tablet (5 mg total) by mouth daily.  [name] aspirin 81 MG tablet Take 1 tablet (81 mg total) by mouth daily.  [name] atorvastatin (LIPITOR) 20 MG tablet Take 20 mg by  mouth daily.  . benazepril (LOTENSIN) 20 MG tablet Take 1 tablet (20 mg total) by mouth daily. HOLD for next 2 days  . clonazePAM (KLONOPIN) 2 MG tablet Take 1 tablet (2 mg total) by mouth 2 (two) times daily as needed (sleep, anxiety).  [name] divalproex (DEPAKOTE) 250 MG DR tablet Take 1-2 tablets (250-500 mg total) by mouth 2 (two) times daily. 250 mg in morning and 500 mg at bedtime  . docusate sodium 100 MG CAPS Take 100 mg by mouth 2 (two) times daily.  . ferrous gluconate (FERGON) 324 MG tablet Take 1 tablet (324 mg total) by mouth 3 (three) times daily with meals.  [name] FLUoxetine (PROZAC) 40 MG capsule Take 1 capsule (40 mg total) by mouth daily.  . fluticasone (FLONASE) 50 MCG/ACT nasal spray Place 1 spray into both nostrils daily.  [name] gabapentin (NEURONTIN) 400 MG capsule Take 2 capsules (800 mg total) by mouth 4 (four) times daily.  . isosorbide mononitrate (IMDUR) 60 MG 24 hr tablet Take 1 tablet (60 mg total) by mouth daily. HOLD for next 2 days  . metFORMIN (GLUCOPHAGE) 500 MG tablet Take 500 mg by mouth 2 (two) times daily with a meal.   . methocarbamol (ROBAXIN) 500 MG tablet Take 1 tablet (500 mg total) by mouth every 6 (six) hours as needed for muscle spasms.  . Omega-3 Fatty Acids (FISH OIL) 1000 MG CAPS Take 2 capsules by mouth daily.  [name] oxyCODONE (ROXICODONE) 15 MG immediate release tablet Take 1 tablet (15 mg total) by mouth every 4 (four) hours as needed.  [name] oxyCODONE (ROXICODONE) 15 MG immediate release tablet Take one tablet by mouth every 3 hours as needed for moderate pain; Take two tablets by mouth every 3 hours as needed for severe pain  . polyethylene glycol (MIRALAX / GLYCOLAX) packet Take 17 g by mouth 2 (two) times daily. (Patient taking differently: Take 17 g by mouth daily. )  . prasugrel (EFFIENT) 10 MG TABS tablet Take 1 tablet (10 mg total) by mouth daily.  . sildenafil (VIAGRA) 50 MG tablet Take 1 tablet (50 mg total) by mouth daily as needed for erectile dysfunction.   . tamsulosin (FLOMAX) 0.4 MG CAPS capsule Take 0.4 mg by mouth daily after breakfast.  . traZODone (DESYREL) 100 MG tablet Take 1 tablet (100 mg total) by mouth at bedtime.  . [DISCONTINUED] ARIPiprazole (ABILIFY) 5 MG tablet TAKE 1 TABLET BY MOUTH DAILY  . [DISCONTINUED] clonazePAM (KLONOPIN) 2 MG tablet Take one tablet by mouth twice daily as needed for anxiety  . [DISCONTINUED] cyclobenzaprine (FLEXERIL) 5 MG tablet Take 1 tablet (5 mg total) by mouth 3 (three) times daily as needed for muscle spasms.  . [DISCONTINUED] cyclobenzaprine (FLEXERIL) 5 MG tablet Take 1 tablet (5 mg total) by mouth 3 (three) times daily as needed for muscle spasms.  . [DISCONTINUED] FLUoxetine (PROZAC) 40 MG capsule Take 40 mg by mouth daily.  . [DISCONTINUED] gabapentin (NEURONTIN) 400 MG capsule Take 800 mg by mouth 4 (four) times daily.  . [DISCONTINUED] polyethylene glycol (MIRALAX / GLYCOLAX)  packet Take 17 g by mouth daily.  . [DISCONTINUED] traZODone (DESYREL) 100 MG tablet TAKE 1 TABLET BY MOUTH EVERY NIGHT AT BEDTIME  . [DISCONTINUED] traZODone (DESYREL) 100 MG tablet Take 1 tablet (100 mg total) by mouth at bedtime.    Physical Exam: Constitutional:  BP 96/50 mmHg  Pulse 86  Ht 6' 1"  (1.854 m)  Wt 251 lb (113.853 kg)  BMI 33.12 kg/m2 Recent Results (from the past 2160 hour(s))  Glucose, capillary     Status: Abnormal   Collection Time: 01/30/14  9:32 PM  Result Value Ref Range   Glucose-Capillary 102 (H) 70 - 99 mg/dL   Comment 1 Notify RN   Glucose, capillary     Status: Abnormal   Collection Time: 01/31/14  6:39 AM  Result Value Ref Range   Glucose-Capillary 120 (H) 70 - 99 mg/dL   Comment 1 Notify RN   Glucose, capillary     Status: Abnormal   Collection Time: 01/31/14 11:02 AM  Result Value Ref Range   Glucose-Capillary 127 (H) 70 - 99 mg/dL  CBC     Status: Abnormal   Collection Time: 04/07/14 12:51 PM  Result Value Ref Range   WBC 3.7 (L) 4.0 - 10.5 K/uL   RBC 4.10 (L) 4.22 -  5.81 MIL/uL   Hemoglobin 12.6 (L) 13.0 - 17.0 g/dL   HCT 37.1 (L) 39.0 - 52.0 %   MCV 90.5 78.0 - 100.0 fL   MCH 30.7 26.0 - 34.0 pg   MCHC 34.0 30.0 - 36.0 g/dL   RDW 13.2 11.5 - 15.5 %   Platelets 154 150 - 400 K/uL  Comprehensive metabolic panel     Status: None   Collection Time: 04/07/14 12:51 PM  Result Value Ref Range   Sodium 139 137 - 147 mEq/L   Potassium 4.4 3.7 - 5.3 mEq/L   Chloride 101 96 - 112 mEq/L   CO2 26 19 - 32 mEq/L   Glucose, Bld 84 70 - 99 mg/dL   BUN 12 6 - 23 mg/dL   Creatinine, Ser 0.88 0.50 - 1.35 mg/dL   Calcium 9.2 8.4 - 10.5 mg/dL   Total Protein 6.7 6.0 - 8.3 g/dL   Albumin 3.9 3.5 - 5.2 g/dL   AST 13 0 - 37 U/L   ALT 7 0 - 53 U/L   Alkaline Phosphatase 55 39 - 117 U/L   Total Bilirubin 0.4 0.3 - 1.2 mg/dL   GFR calc non Af Amer >90 >90 mL/min   GFR calc Af Amer >90 >90 mL/min    Comment: (NOTE) The eGFR has been calculated using the CKD EPI equation. This calculation has not been validated in all clinical situations. eGFR's persistently <90 mL/min signify possible Chronic Kidney Disease.    Anion gap 12 5 - 15  Glucose, capillary     Status: Abnormal   Collection Time: 04/15/14  6:59 AM  Result Value Ref Range   Glucose-Capillary 105 (H) 70 - 99 mg/dL  APTT     Status: None   Collection Time: 04/15/14  7:15 AM  Result Value Ref Range   aPTT 31 24 - 37 seconds  Protime-INR     Status: None   Collection Time: 04/15/14  7:15 AM  Result Value Ref Range   Prothrombin Time 15.2 11.6 - 15.2 seconds   INR 1.18 0.00 - 1.49  Glucose, capillary     Status: None   Collection Time: 04/15/14  9:50 AM  Result Value Ref  Range   Glucose-Capillary 93 70 - 99 mg/dL   Comment 1 Documented in Chart    Comment 2 Notify RN   Glucose, capillary     Status: None   Collection Time: 04/15/14 11:19 AM  Result Value Ref Range   Glucose-Capillary 98 70 - 99 mg/dL   Comment 1 Notify RN   Glucose, capillary     Status: Abnormal   Collection Time: 04/15/14   4:22 PM  Result Value Ref Range   Glucose-Capillary 110 (H) 70 - 99 mg/dL   Comment 1 Notify RN   Glucose, capillary     Status: Abnormal   Collection Time: 04/15/14  9:26 PM  Result Value Ref Range   Glucose-Capillary 114 (H) 70 - 99 mg/dL  Glucose, capillary     Status: Abnormal   Collection Time: 04/16/14  6:39 AM  Result Value Ref Range   Glucose-Capillary 126 (H) 70 - 99 mg/dL  Glucose, capillary     Status: Abnormal   Collection Time: 04/16/14 11:32 AM  Result Value Ref Range   Glucose-Capillary 134 (H) 70 - 99 mg/dL   Comment 1 Notify RN   Glucose, capillary     Status: Abnormal   Collection Time: 04/16/14  4:17 PM  Result Value Ref Range   Glucose-Capillary 107 (H) 70 - 99 mg/dL   Comment 1 Notify RN   Glucose, capillary     Status: Abnormal   Collection Time: 04/16/14  9:20 PM  Result Value Ref Range   Glucose-Capillary 136 (H) 70 - 99 mg/dL   Comment 1 Notify RN   Glucose, capillary     Status: Abnormal   Collection Time: 04/17/14  6:40 AM  Result Value Ref Range   Glucose-Capillary 131 (H) 70 - 99 mg/dL  Glucose, capillary     Status: Abnormal   Collection Time: 04/17/14 12:14 PM  Result Value Ref Range   Glucose-Capillary 145 (H) 70 - 99 mg/dL     Musculoskeletal: Strength & Muscle Tone: decreased Gait & Station: Difficulty walking due to pain Patient leans: Front  Mental Status Examination;  Patient is casually dressed and fairly groomed.  He is using wheelchair and he has a cast on his right foot.  He maintains fair eye contact.  His speech is fast but coherent.  He described his mood neutral and his affect is mood appropriate.  He denies any suicidal thoughts or homicidal thoughts.  He denies any auditory hallucination or visual hallucination.  There were no paranoia , delusions or any of the session.  He denies any tremors or shakes.  His fund of knowledge is adequate.  He is alert and oriented x3.  His insight judgment and impulse control is  fair.  Established Problem, Stable/Improving (1), Review of Psycho-Social Stressors (1), Review or order clinical lab tests (1), Decision to obtain old records (1), Review and summation of old records (2), Review of Last Therapy Session (1) and Review of Medication Regimen & Side Effects (2)  Assessment: Axis I: Mood disorder NOS, rule out bipolar disorder depressed type  Axis II: Deferred  Axis III:  Past Medical History  Diagnosis Date  . Impaired glucose tolerance 09/27/2010  . HYPERLIPIDEMIA-MIXED 07/15/2009  . Chronic pain syndrome 10/27/2009  . HYPERTENSION 06/24/2009  . CORONARY ARTERY DISEASE 06/24/2009    a. s/p multiple PCIs - In 2008 he had a Taxus DES to the mild LAD, Endeavor DES to mid LCX and distal LCX. In January 2009 he had DES to  distal LCX, mid LCX and proximal LCX. In November 2009 had BMS x 2 to the mid RCA. Cath 10/2011 with patent stents, noncardiac CP. LHC 01/2013: patent stents (noncardiac CP).  [name] URETHRAL STRICTURE 06/24/2009  . SHOULDER PAIN, BILATERAL 06/24/2009  . ANKLE PAIN, RIGHT 06/24/2009  . SCIATICA, LEFT 04/19/2010  . NEPHROLITHIASIS, HX OF 06/24/2009  . ERECTILE DYSFUNCTION, ORGANIC 05/30/2010  . Obesity   . ALLERGIC RHINITIS 06/24/2009  . Anxiety   . Depression     Prior suicide attempt  . Irregular heart beat   . Pneumonia   . Self-catheterizes urinary bladder   . Anginal pain   . Type II diabetes mellitus   . Hepatitis C dx'd 1979  . DEGENERATIVE JOINT DISEASE 06/24/2009  . [place] DISEASE, LUMBAR 04/19/2010  . Arthritis     "all my joints" (09/30/2013)  . Fibromyalgia     "left leg" (09/30/2013)  . Chronic back pain     "my whole back" (09/30/2013)  . Kidney stones     "passed on my own"  . Myocardial infarction 2008  . OSA (obstructive sleep apnea)     not using CPAP (09/30/2013)  . Heart murmur     Axis IV: Moderate   Plan:  Patient is fairly stable on his current psychotropic medication.  I will continue Prozac 40 mg daily, trazodone 100 mg at  bedtime and Abilify 5 mg daily.  He does not have any tremors or shakes. He is taking multiple medication including narcotic pain medication.  His Neurontin , Klonopin, Depakote as given by his primary care physician .  Reinforce to see Dr. Cheryln Manly for counseling and get the Depakote level which has not done in a while. Recommended to call us back if he has any question or any concern.  I will see him again in 3 months. Time spent 25 minutes.  More than 50% of the time spent in psychoeducation, counseling and coordination of care.  Discuss safety plan that anytime having active suicidal thoughts or homicidal thoughts then patient need to call 911 or go to the local emergency room.  Matas Burrows T., MD 04/30/2014

## 2014-05-01 NOTE — Progress Notes (Signed)
Discharge date was added on 05/01/2014. It was not a part of the original discharge summary as the discharge summary is needed to  process the discharge and facilitate transfer to the SNF. The patient's discharge summary is necessarily needed before the patient can be discharged. So the discharge summary is performed before discharge occurs and also occurs prior to the discharge facility is recognized on occasion.

## 2014-05-06 ENCOUNTER — Non-Acute Institutional Stay (SKILLED_NURSING_FACILITY): Payer: Commercial Managed Care - HMO | Admitting: Adult Health

## 2014-05-06 ENCOUNTER — Encounter: Payer: Self-pay | Admitting: Adult Health

## 2014-05-06 DIAGNOSIS — I2510 Atherosclerotic heart disease of native coronary artery without angina pectoris: Secondary | ICD-10-CM

## 2014-05-06 DIAGNOSIS — F3130 Bipolar disorder, current episode depressed, mild or moderate severity, unspecified: Secondary | ICD-10-CM

## 2014-05-06 DIAGNOSIS — E1165 Type 2 diabetes mellitus with hyperglycemia: Secondary | ICD-10-CM | POA: Insufficient documentation

## 2014-05-06 DIAGNOSIS — E785 Hyperlipidemia, unspecified: Secondary | ICD-10-CM

## 2014-05-06 DIAGNOSIS — G894 Chronic pain syndrome: Secondary | ICD-10-CM

## 2014-05-06 DIAGNOSIS — M19071 Primary osteoarthritis, right ankle and foot: Secondary | ICD-10-CM

## 2014-05-06 DIAGNOSIS — F419 Anxiety disorder, unspecified: Secondary | ICD-10-CM

## 2014-05-06 DIAGNOSIS — I10 Essential (primary) hypertension: Secondary | ICD-10-CM

## 2014-05-06 DIAGNOSIS — G4700 Insomnia, unspecified: Secondary | ICD-10-CM

## 2014-05-06 DIAGNOSIS — N400 Enlarged prostate without lower urinary tract symptoms: Secondary | ICD-10-CM

## 2014-05-06 DIAGNOSIS — E118 Type 2 diabetes mellitus with unspecified complications: Secondary | ICD-10-CM

## 2014-05-06 DIAGNOSIS — J309 Allergic rhinitis, unspecified: Secondary | ICD-10-CM

## 2014-05-06 DIAGNOSIS — E119 Type 2 diabetes mellitus without complications: Secondary | ICD-10-CM | POA: Insufficient documentation

## 2014-05-06 DIAGNOSIS — D62 Acute posthemorrhagic anemia: Secondary | ICD-10-CM

## 2014-05-06 NOTE — Progress Notes (Signed)
Patient ID: Travis [name], male   DOB: [birth year], 63 y.o.   MRN: [id number]   04/21/14  Facility:  Nursing Home Location:  [place] Room Number: 5631-4 LEVEL OF CARE:  SNF (31)   Chief Complaint  Patient presents with  . Hospitalization Follow-up    Degenerative arthritis of right foot S/P right subtalar talonavicularlonavicular fusion, hypertension, depression, hyperlipidemia, anemia, allergic rhinitis, chronic pain syndrome, CAD, ABDs mellitus, BPH, insomnia and anxiety    HISTORY OF PRESENT ILLNESS:  This is a 63 year old male who has been admitted to [facility] on 04/17/14 from [facility] with degenerative arthritis of right foot S/P talonavicular fusion. He has been admitted for a short-term rehabilitation.  REASSESSMENT OF ONGOING PROBLEMS:  HTN: Pt 's HTN remains stable.  Denies CP, sob, DOE, headaches, dizziness or visual disturbances.  No complications from the medications currently being used.  Last BP : 108/64  ALLERGIC RHINITIS: Allergic rhinitis remains stable.  Patient denies ongoing symptoms such as runny nose sneezing or tearing. No complications reported from the current medication(s) being used.  CAD: The angina has been stable. The patient denies dyspnea on exertion, orthopnea, pedal edema, palpitations and paroxysmal nocturnal dyspnea. No complications noted from the medication presently being used.   PAST MEDICAL HISTORY:  Past Medical History  Diagnosis Date  . Impaired glucose tolerance 09/27/2010  . HYPERLIPIDEMIA-MIXED 07/15/2009  . Chronic pain syndrome 10/27/2009  . HYPERTENSION 06/24/2009  . CORONARY ARTERY DISEASE 06/24/2009    a. s/p multiple PCIs - In 2008 he had a Taxus DES to the mild LAD, Endeavor DES to mid LCX and distal LCX. In January 2009 he had DES to distal LCX, mid LCX and proximal LCX. In November 2009 had BMS x 2 to the mid RCA. Cath 10/2011 with patent stents, noncardiac CP. LHC 01/2013: patent stents  (noncardiac CP).  [name] URETHRAL STRICTURE 06/24/2009  . SHOULDER PAIN, BILATERAL 06/24/2009  . ANKLE PAIN, RIGHT 06/24/2009  . SCIATICA, LEFT 04/19/2010  . NEPHROLITHIASIS, HX OF 06/24/2009  . ERECTILE DYSFUNCTION, ORGANIC 05/30/2010  . Obesity   . ALLERGIC RHINITIS 06/24/2009  . Anxiety   . Depression     Prior suicide attempt  . Irregular heart beat   . Pneumonia   . Self-catheterizes urinary bladder   . Anginal pain   . Type II diabetes mellitus   . Hepatitis C dx'd 1979  . DEGENERATIVE JOINT DISEASE 06/24/2009  . [place] DISEASE, LUMBAR 04/19/2010  . Arthritis     "all my joints" (09/30/2013)  . Fibromyalgia     "left leg" (09/30/2013)  . Chronic back pain     "my whole back" (09/30/2013)  . Kidney stones     "passed on my own"  . Myocardial infarction 2008  . OSA (obstructive sleep apnea)     not using CPAP (09/30/2013)  . Heart murmur     CURRENT MEDICATIONS: Reviewed per MAR/see medication list  No Known Allergies   REVIEW OF SYSTEMS:  GENERAL: no change in appetite, no fatigue, no weight changes, no fever, chills or weakness RESPIRATORY: no cough, SOB, DOE, wheezing, hemoptysis CARDIAC: no chest pain, or palpitations GI: no abdominal pain, diarrhea, constipation, heart burn, nausea or vomiting  PHYSICAL EXAMINATION  GENERAL: no acute distress, obese EYES: conjunctivae normal, sclerae normal, normal eye lids NECK: supple, trachea midline, no neck masses, no thyroid tenderness, no thyromegaly LYMPHATICS: no LAN in the neck, no supraclavicular LAN RESPIRATORY: breathing is even & unlabored, BS  CTAB CARDIAC: RRR, no murmur,no extra heart sounds, BLE edema 2+ GI: abdomen soft, normal BS, no masses, no tenderness, no hepatomegaly, no splenomegaly EXTREMITIES:  Unable to move all 4 extremities, right foot covered with dressing PSYCHIATRIC: the patient is sleepy   LABS/RADIOLOGY: Labs reviewed: Basic Metabolic Panel:  Recent Labs  01/29/14 0620 01/30/14 0559 04/07/14 1251    NA 138 136* 139  K 5.1 4.9 4.4  CL 104 102 101  CO2 24 25 26   GLUCOSE 156* 113* 84  BUN 29* 18 12  CREATININE 1.08 0.79 0.88  CALCIUM 8.2* 8.6 9.2   Liver Function Tests:  Recent Labs  09/29/13 1410 04/07/14 1251  AST 43* 13  ALT 15 7  ALKPHOS 41 55  BILITOT 0.7 0.4  PROT 6.3 6.7  ALBUMIN 3.3* 3.9   CBC:  Recent Labs  09/29/13 1410  01/29/14 0620 01/30/14 0559 04/07/14 1251  WBC 4.8  < > 5.6 4.6 3.7*  NEUTROABS 3.1  --   --   --   --   HGB 11.3*  < > 8.6* 8.1* 12.6*  HCT 33.9*  < > 25.5* 23.7* 37.1*  MCV 93.1  < > 92.1 90.1 90.5  PLT 156  < > 148* 161 154  < > = values in this interval not displayed.  Cardiac Enzymes:  Recent Labs  01/28/14 1552  TROPONINI <0.30   CBG:  Recent Labs  04/16/14 2120 04/17/14 0640 04/17/14 1214  GLUCAP 136* 131* 145*    Dg Lumbar Spine Complete  04/23/2014   CLINICAL DATA:  Status post fall at nursing home on 04/23/2014. Initial encounter. Low back pain.  EXAM: LUMBAR SPINE - COMPLETE 4+ VIEW  COMPARISON:  None.  FINDINGS: There are 5 nonrib bearing lumbar-type vertebral bodies. The vertebral body heights are maintained. The alignment is anatomic. There is no spondylolysis. There is no acute fracture or static listhesis. There is degenerative disc disease at L1-2, L2-3 and L5-S1. There is bilateral facet arthropathy at L3-4, L4-5 and L5-S1.  The SI joints are unremarkable.  IMPRESSION: No acute osseous injury of the lumbar spine.   Electronically Signed   By: Kathreen Devoid   On: 04/23/2014 21:28   Dg Pelvis 1-2 Views  04/23/2014   CLINICAL DATA:  Right lower extremity pain after fall at nursing home, initial encounter.  EXAM: PELVIS - 1-2 VIEW  COMPARISON:  March 13, 2013.  FINDINGS: There is no evidence of pelvic fracture or diastasis. No pelvic bone lesions are seen. Mild degenerative disease is seen involving both hip joints.  IMPRESSION: Mild degenerative joint disease of both hips. No fracture or dislocation is noted.    Electronically Signed   By: Sabino Dick M.D.   On: 04/23/2014 21:29   Dg Femur Right  04/23/2014   CLINICAL DATA:  Status post fall at nursing home. Initial encounter.  EXAM: RIGHT FEMUR - 2 VIEW  COMPARISON:  None.  FINDINGS: No acute fracture or dislocation. Total right knee arthroplasty. Moderate osteoarthritis of the right hip. No lytic or sclerotic osseous lesion. Unremarkable soft tissues.  IMPRESSION: 1. No acute osseous injury of the right femur. 2. Moderate osteoarthritis of the right hip.   Electronically Signed   By: Kathreen Devoid   On: 04/23/2014 21:30   Ct Head Wo Contrast  04/23/2014   CLINICAL DATA:  Initial evaluation for acute trauma, fall.  EXAM: CT HEAD WITHOUT CONTRAST  CT CERVICAL SPINE WITHOUT CONTRAST  TECHNIQUE: Multidetector CT imaging of the head  and cervical spine was performed following the standard protocol without intravenous contrast. Multiplanar CT image reconstructions of the cervical spine were also generated.  COMPARISON:  Prior study from 09/02/2013.  FINDINGS: CT HEAD FINDINGS  Mild diffuse prominence of the CSF containing spaces is compatible with generalized cerebral atrophy, normal for patient age. Minimal hypodensity within the periventricular and deep white matter both cerebral hemispheres is consistent with mild chronic small vessel ischemic changes.  There is no acute intracranial hemorrhage or infarct. No mass lesion or midline shift. Gray-white matter differentiation is well maintained. Ventricles are normal in size without evidence of hydrocephalus. CSF containing spaces are within normal limits. No extra-axial fluid collection.  The calvarium is intact.  Orbital soft tissues are within normal limits.  There is partial opacification of the sphenoid sinus and ethmoidal air cells. No mastoid effusion.  Scalp soft tissues are unremarkable.  CT CERVICAL SPINE FINDINGS  The patient is status post ACDF at the C3-C4 level. Hardware is stable in alignment without evidence  of failure or complication.  Vertebral bodies are normally aligned with preservation of the normal cervical lordosis. Normal C1-2 articulations are intact. Prevertebral soft tissues are normal. No acute fracture listhesis.  Moderate multilevel degenerative disc disease is evidenced by intervertebral disc space narrowing, endplate sclerosis, and osteophytosis seen throughout the cervical spine, most severe at C6-7.  Visualized soft tissues of the neck are within normal limits.  Visualized lung apices are clear.  IMPRESSION: CT BRAIN:  1. No intracranial process. 2. Moderate sphenoethmoidal sinus disease.  CT CERVICAL SPINE:  1. No acute traumatic injury within the cervical spine. 2. ACDF at J6-2 without complication.   Electronically Signed   By: Jeannine Boga M.D.   On: 04/23/2014 21:32   Ct Cervical Spine Wo Contrast  04/23/2014   CLINICAL DATA:  Initial evaluation for acute trauma, fall.  EXAM: CT HEAD WITHOUT CONTRAST  CT CERVICAL SPINE WITHOUT CONTRAST  TECHNIQUE: Multidetector CT imaging of the head and cervical spine was performed following the standard protocol without intravenous contrast. Multiplanar CT image reconstructions of the cervical spine were also generated.  COMPARISON:  Prior study from 09/02/2013.  FINDINGS: CT HEAD FINDINGS  Mild diffuse prominence of the CSF containing spaces is compatible with generalized cerebral atrophy, normal for patient age. Minimal hypodensity within the periventricular and deep white matter both cerebral hemispheres is consistent with mild chronic small vessel ischemic changes.  There is no acute intracranial hemorrhage or infarct. No mass lesion or midline shift. Gray-white matter differentiation is well maintained. Ventricles are normal in size without evidence of hydrocephalus. CSF containing spaces are within normal limits. No extra-axial fluid collection.  The calvarium is intact.  Orbital soft tissues are within normal limits.  There is partial  opacification of the sphenoid sinus and ethmoidal air cells. No mastoid effusion.  Scalp soft tissues are unremarkable.  CT CERVICAL SPINE FINDINGS  The patient is status post ACDF at the C3-C4 level. Hardware is stable in alignment without evidence of failure or complication.  Vertebral bodies are normally aligned with preservation of the normal cervical lordosis. Normal C1-2 articulations are intact. Prevertebral soft tissues are normal. No acute fracture listhesis.  Moderate multilevel degenerative disc disease is evidenced by intervertebral disc space narrowing, endplate sclerosis, and osteophytosis seen throughout the cervical spine, most severe at C6-7.  Visualized soft tissues of the neck are within normal limits.  Visualized lung apices are clear.  IMPRESSION: CT BRAIN:  1. No intracranial process. 2. Moderate sphenoethmoidal  sinus disease.  CT CERVICAL SPINE:  1. No acute traumatic injury within the cervical spine. 2. ACDF at J2-9 without complication.   Electronically Signed   By: Jeannine Boga M.D.   On: 04/23/2014 21:32    ASSESSMENT/PLAN:  Degenerative arthritis of right foot S/P talonavicular fusion - for rehabilitation; continue Robaxin 500 mg by mouth every 6 hours when necessary for muscle spasm; oxycodone 15 mg 1 tab by mouth every 4 hours when necessary and discontinue oxycodone 15 mg 2 tabs by mouth every 4 hours when necessary since since he is so sleepy Hypertension - well controlled; continue Norvasc 5 mg by mouth daily and Lotensin 20 mg by mouth daily Depression -  mood is stable; continue Abilify 5 mg by mouth daily and Prozac 40 mg by mouth daily Hyperlipidemia - continue Lipitor 20 mg by mouth daily and Fish oil 1000 mg by mouth daily Anemia, acute blood loss - stable; continue Fergon 324 mg 1 tab by mouth 3 times a day Allergic rhinitis - continue Flonase 50 g/ACT 1 spray into nostrils daily Chronic pain syndrome - well controlled; continue Neurontin 800 mg by mouth 4  times a day and Depakote 250 mg by mouth every morning and 500 mg by mouth daily at bedtime CAD - stable; continue indoor 60 mg by mouth daily, Effient 10 mg by mouth daily and aspirin 81 mg by mouth daily Diabetes mellitus, type II - continue Glucophage 500 mg by mouth twice a day BPH - continue Flomax 0.4 mg by mouth daily Insomnia - continue trazodone 100 mg by mouth daily at bedtime Anxiety - stable; continue Klonopin 2 mg by mouth twice a day when necessary   Spent 50 minutes in patient care.     [facility], [place] [phone]

## 2014-05-06 NOTE — Progress Notes (Signed)
Patient ID: Travis [name], male   DOB: 07-02-1950, 63 y.o.   MRN: [id number]   05/06/14  Facility:  Nursing Home Location:  [place] Room Number: 7510-2 LEVEL OF CARE:  SNF (31)   Chief Complaint  Patient presents with  . Discharge Note    Degenerative arthritis of right foot S/P right subtalar talonavicularlonavicular fusion, hypertension, depression, hyperlipidemia, anemia, allergic rhinitis, chronic pain syndrome, CAD, ABDs mellitus, BPH, insomnia and anxiety     HISTORY OF PRESENT ILLNESS:  This is a 63 year old male who  is for discharge home with home health PT OT and nursing.  DME: standard crutches for stairs. He  has been admitted to [facility] on 04/17/14 from [facility] with degenerative arthritis of right foot S/P talonavicular fusion. Patient was admitted to this facility for short-term rehabilitation after the patient's recent hospitalization.  Patient has completed SNF rehabilitation and therapy has cleared the patient for discharge.  REASSESSMENT OF ONGOING PROBLEMS:  ANEMIA: The anemia has been stable. The patient denies fatigue, melena or hematochezia. No complications from the medications currently being used. 11/15 hgb 12.6  BPH: The patient's BPH remains stable. Patient denies urinary hesitancy or dribbling. No complications reported from the current medications being used.   HTN: Pt 's HTN remains stable.  Denies CP, sob, DOE, headaches, dizziness or visual disturbances.  No complications from the medications currently being used.  Last BP : 119/68  PAST MEDICAL HISTORY:  Past Medical History  Diagnosis Date  . Impaired glucose tolerance 09/27/2010  . HYPERLIPIDEMIA-MIXED 07/15/2009  . Chronic pain syndrome 10/27/2009  . HYPERTENSION 06/24/2009  . CORONARY ARTERY DISEASE 06/24/2009    a. s/p multiple PCIs - In 2008 he had a Taxus DES to the mild LAD, Endeavor DES to mid LCX and distal LCX. In January 2009 he had DES to  distal LCX, mid LCX and proximal LCX. In November 2009 had BMS x 2 to the mid RCA. Cath 10/2011 with patent stents, noncardiac CP. LHC 01/2013: patent stents (noncardiac CP).  [name] URETHRAL STRICTURE 06/24/2009  . SHOULDER PAIN, BILATERAL 06/24/2009  . ANKLE PAIN, RIGHT 06/24/2009  . SCIATICA, LEFT 04/19/2010  . NEPHROLITHIASIS, HX OF 06/24/2009  . ERECTILE DYSFUNCTION, ORGANIC 05/30/2010  . Obesity   . ALLERGIC RHINITIS 06/24/2009  . Anxiety   . Depression     Prior suicide attempt  . Irregular heart beat   . Pneumonia   . Self-catheterizes urinary bladder   . Anginal pain   . Type II diabetes mellitus   . Hepatitis C dx'd 1979  . DEGENERATIVE JOINT DISEASE 06/24/2009  . [place] DISEASE, LUMBAR 04/19/2010  . Arthritis     "all my joints" (09/30/2013)  . Fibromyalgia     "left leg" (09/30/2013)  . Chronic back pain     "my whole back" (09/30/2013)  . Kidney stones     "passed on my own"  . Myocardial infarction 2008  . OSA (obstructive sleep apnea)     not using CPAP (09/30/2013)  . Heart murmur     CURRENT MEDICATIONS: Reviewed per MAR/see medication list  No Known Allergies   REVIEW OF SYSTEMS:  GENERAL: no change in appetite, no fatigue, no weight changes, no fever, chills or weakness RESPIRATORY: no cough, SOB, DOE, wheezing, hemoptysis CARDIAC: no chest pain, or palpitations GI: no abdominal pain, diarrhea, constipation, heart burn, nausea or vomiting  PHYSICAL EXAMINATION  GENERAL: no acute distress, obese EYES: conjunctivae normal, sclerae normal, normal  eye lids NECK: supple, trachea midline, no neck masses, no thyroid tenderness, no thyromegaly LYMPHATICS: no LAN in the neck, no supraclavicular LAN RESPIRATORY: breathing is even & unlabored, BS CTAB CARDIAC: RRR, no murmur,no extra heart sounds GI: abdomen soft, normal BS, no masses, no tenderness, no hepatomegaly, no splenomegaly EXTREMITIES:  right foot covered with dressing; able to move all 4 extremities PSYCHIATRIC: the  patient is alert & oriented to person, affect & behavior appropriate   LABS/RADIOLOGY: Labs reviewed: Basic Metabolic Panel:  Recent Labs  01/29/14 0620 01/30/14 0559 04/07/14 1251  NA 138 136* 139  K 5.1 4.9 4.4  CL 104 102 101  CO2 24 25 26   GLUCOSE 156* 113* 84  BUN 29* 18 12  CREATININE 1.08 0.79 0.88  CALCIUM 8.2* 8.6 9.2   Liver Function Tests:  Recent Labs  09/29/13 1410 04/07/14 1251  AST 43* 13  ALT 15 7  ALKPHOS 41 55  BILITOT 0.7 0.4  PROT 6.3 6.7  ALBUMIN 3.3* 3.9   CBC:  Recent Labs  09/29/13 1410  01/29/14 0620 01/30/14 0559 04/07/14 1251  WBC 4.8  < > 5.6 4.6 3.7*  NEUTROABS 3.1  --   --   --   --   HGB 11.3*  < > 8.6* 8.1* 12.6*  HCT 33.9*  < > 25.5* 23.7* 37.1*  MCV 93.1  < > 92.1 90.1 90.5  PLT 156  < > 148* 161 154  < > = values in this interval not displayed.  Cardiac Enzymes:  Recent Labs  01/28/14 1552  TROPONINI <0.30   CBG:  Recent Labs  04/16/14 2120 04/17/14 0640 04/17/14 1214  GLUCAP 136* 131* 145*    Dg Lumbar Spine Complete  04/23/2014   CLINICAL DATA:  Status post fall at nursing home on 04/23/2014. Initial encounter. Low back pain.  EXAM: LUMBAR SPINE - COMPLETE 4+ VIEW  COMPARISON:  None.  FINDINGS: There are 5 nonrib bearing lumbar-type vertebral bodies. The vertebral body heights are maintained. The alignment is anatomic. There is no spondylolysis. There is no acute fracture or static listhesis. There is degenerative disc disease at L1-2, L2-3 and L5-S1. There is bilateral facet arthropathy at L3-4, L4-5 and L5-S1.  The SI joints are unremarkable.  IMPRESSION: No acute osseous injury of the lumbar spine.   Electronically Signed   By: Kathreen Devoid   On: 04/23/2014 21:28   Dg Pelvis 1-2 Views  04/23/2014   CLINICAL DATA:  Right lower extremity pain after fall at nursing home, initial encounter.  EXAM: PELVIS - 1-2 VIEW  COMPARISON:  March 13, 2013.  FINDINGS: There is no evidence of pelvic fracture or diastasis.  No pelvic bone lesions are seen. Mild degenerative disease is seen involving both hip joints.  IMPRESSION: Mild degenerative joint disease of both hips. No fracture or dislocation is noted.   Electronically Signed   By: Sabino Dick M.D.   On: 04/23/2014 21:29   Dg Femur Right  04/23/2014   CLINICAL DATA:  Status post fall at nursing home. Initial encounter.  EXAM: RIGHT FEMUR - 2 VIEW  COMPARISON:  None.  FINDINGS: No acute fracture or dislocation. Total right knee arthroplasty. Moderate osteoarthritis of the right hip. No lytic or sclerotic osseous lesion. Unremarkable soft tissues.  IMPRESSION: 1. No acute osseous injury of the right femur. 2. Moderate osteoarthritis of the right hip.   Electronically Signed   By: Kathreen Devoid   On: 04/23/2014 21:30   Ct Head Wo  Contrast  04/23/2014   CLINICAL DATA:  Initial evaluation for acute trauma, fall.  EXAM: CT HEAD WITHOUT CONTRAST  CT CERVICAL SPINE WITHOUT CONTRAST  TECHNIQUE: Multidetector CT imaging of the head and cervical spine was performed following the standard protocol without intravenous contrast. Multiplanar CT image reconstructions of the cervical spine were also generated.  COMPARISON:  Prior study from 09/02/2013.  FINDINGS: CT HEAD FINDINGS  Mild diffuse prominence of the CSF containing spaces is compatible with generalized cerebral atrophy, normal for patient age. Minimal hypodensity within the periventricular and deep white matter both cerebral hemispheres is consistent with mild chronic small vessel ischemic changes.  There is no acute intracranial hemorrhage or infarct. No mass lesion or midline shift. Gray-white matter differentiation is well maintained. Ventricles are normal in size without evidence of hydrocephalus. CSF containing spaces are within normal limits. No extra-axial fluid collection.  The calvarium is intact.  Orbital soft tissues are within normal limits.  There is partial opacification of the sphenoid sinus and ethmoidal air  cells. No mastoid effusion.  Scalp soft tissues are unremarkable.  CT CERVICAL SPINE FINDINGS  The patient is status post ACDF at the C3-C4 level. Hardware is stable in alignment without evidence of failure or complication.  Vertebral bodies are normally aligned with preservation of the normal cervical lordosis. Normal C1-2 articulations are intact. Prevertebral soft tissues are normal. No acute fracture listhesis.  Moderate multilevel degenerative disc disease is evidenced by intervertebral disc space narrowing, endplate sclerosis, and osteophytosis seen throughout the cervical spine, most severe at C6-7.  Visualized soft tissues of the neck are within normal limits.  Visualized lung apices are clear.  IMPRESSION: CT BRAIN:  1. No intracranial process. 2. Moderate sphenoethmoidal sinus disease.  CT CERVICAL SPINE:  1. No acute traumatic injury within the cervical spine. 2. ACDF at P2-3 without complication.   Electronically Signed   By: Jeannine Boga M.D.   On: 04/23/2014 21:32   Ct Cervical Spine Wo Contrast  04/23/2014   CLINICAL DATA:  Initial evaluation for acute trauma, fall.  EXAM: CT HEAD WITHOUT CONTRAST  CT CERVICAL SPINE WITHOUT CONTRAST  TECHNIQUE: Multidetector CT imaging of the head and cervical spine was performed following the standard protocol without intravenous contrast. Multiplanar CT image reconstructions of the cervical spine were also generated.  COMPARISON:  Prior study from 09/02/2013.  FINDINGS: CT HEAD FINDINGS  Mild diffuse prominence of the CSF containing spaces is compatible with generalized cerebral atrophy, normal for patient age. Minimal hypodensity within the periventricular and deep white matter both cerebral hemispheres is consistent with mild chronic small vessel ischemic changes.  There is no acute intracranial hemorrhage or infarct. No mass lesion or midline shift. Gray-white matter differentiation is well maintained. Ventricles are normal in size without evidence of  hydrocephalus. CSF containing spaces are within normal limits. No extra-axial fluid collection.  The calvarium is intact.  Orbital soft tissues are within normal limits.  There is partial opacification of the sphenoid sinus and ethmoidal air cells. No mastoid effusion.  Scalp soft tissues are unremarkable.  CT CERVICAL SPINE FINDINGS  The patient is status post ACDF at the C3-C4 level. Hardware is stable in alignment without evidence of failure or complication.  Vertebral bodies are normally aligned with preservation of the normal cervical lordosis. Normal C1-2 articulations are intact. Prevertebral soft tissues are normal. No acute fracture listhesis.  Moderate multilevel degenerative disc disease is evidenced by intervertebral disc space narrowing, endplate sclerosis, and osteophytosis seen throughout the cervical  spine, most severe at C6-7.  Visualized soft tissues of the neck are within normal limits.  Visualized lung apices are clear.  IMPRESSION: CT BRAIN:  1. No intracranial process. 2. Moderate sphenoethmoidal sinus disease.  CT CERVICAL SPINE:  1. No acute traumatic injury within the cervical spine. 2. ACDF at S2-3 without complication.   Electronically Signed   By: Jeannine Boga M.D.   On: 04/23/2014 21:32    ASSESSMENT/PLAN:  Degenerative arthritis of right foot S/P talonavicular fusion - for home health PT, OT and Nursing; continue Robaxin 500 mg by mouth every 6 hours when necessary for muscle spasm and oxycodone 15 mg 1 tab by mouth every 4 hours when necessary  Hypertension - well controlled; continue Norvasc 5 mg by mouth daily and Lotensin 20 mg by mouth daily Bipolar Disorder  -  mood is stable; continue Abilify 5 mg by mouth daily,Depakote 250 mg by mouth every morning and 500 mg by mouth daily at bedtime and Prozac 40 mg by mouth daily Hyperlipidemia - continue Lipitor 20 mg by mouth daily and Fish oil 1000 mg by mouth daily Anemia, acute blood loss - stable;  hgb 12.6 ;  discontinue Fergon 324 mg 1 tab by mouth 3 times a day Allergic rhinitis - continue Flonase 50 g/ACT 1 spray into nostrils daily Chronic pain syndrome - well controlled; continue Neurontin 800 mg by mouth 4 times a day  CAD - stable; continue indoor 60 mg by mouth daily, Effient 10 mg by mouth daily and aspirin 81 mg by mouth daily Diabetes mellitus, type II - well controlled; continue Glucophage 500 mg by mouth twice a day BPH - continue Flomax 0.4 mg by mouth daily Insomnia - continue trazodone 100 mg by mouth daily at bedtime Anxiety - stable; continue Klonopin 2 mg by mouth twice a day when necessary   I have filled out patient's discharge paperwork and written prescriptions.  Patient will receive home health PT, OT and Nursing.  DME provided: standard crutches for stairs  Total discharge time: Greater than 30 minutes  Discharge time involved coordination of the discharge process with social worker, nursing staff and therapy department. Medical justification for home health services/DME verified.     [facility], [place] [phone]

## 2014-05-06 NOTE — Progress Notes (Deleted)
Patient ID: Travis [name], male   DOB: May 26, 1950, 63 y.o.   MRN: [id number]    Facility:  Nursing Home Location:  [place] Room Number: 1104-1 LEVEL OF CARE:  SNF (31)  Routine Visit  No chief complaint on file.   HISTORY OF PRESENT ILLNESS:  REASSESSMENT OF ONGOING PROBLEMS:  PAST MEDICAL HISTORY:  Past Medical History  Diagnosis Date  . Impaired glucose tolerance 09/27/2010  . HYPERLIPIDEMIA-MIXED 07/15/2009  . Chronic pain syndrome 10/27/2009  . HYPERTENSION 06/24/2009  . CORONARY ARTERY DISEASE 06/24/2009    a. s/p multiple PCIs - In 2008 he had a Taxus DES to the mild LAD, Endeavor DES to mid LCX and distal LCX. In January 2009 he had DES to distal LCX, mid LCX and proximal LCX. In November 2009 had BMS x 2 to the mid RCA. Cath 10/2011 with patent stents, noncardiac CP. LHC 01/2013: patent stents (noncardiac CP).  [name] URETHRAL STRICTURE 06/24/2009  . SHOULDER PAIN, BILATERAL 06/24/2009  . ANKLE PAIN, RIGHT 06/24/2009  . SCIATICA, LEFT 04/19/2010  . NEPHROLITHIASIS, HX OF 06/24/2009  . ERECTILE DYSFUNCTION, ORGANIC 05/30/2010  . Obesity   . ALLERGIC RHINITIS 06/24/2009  . Anxiety   . Depression     Prior suicide attempt  . Irregular heart beat   . Pneumonia   . Self-catheterizes urinary bladder   . Anginal pain   . Type II diabetes mellitus   . Hepatitis C dx'd 1979  . DEGENERATIVE JOINT DISEASE 06/24/2009  . [place] DISEASE, LUMBAR 04/19/2010  . Arthritis     "all my joints" (09/30/2013)  . Fibromyalgia     "left leg" (09/30/2013)  . Chronic back pain     "my whole back" (09/30/2013)  . Kidney stones     "passed on my own"  . Myocardial infarction 2008  . OSA (obstructive sleep apnea)     not using CPAP (09/30/2013)  . Heart murmur     CURRENT MEDICATIONS: Reviewed per MAR/see medication list  No Known Allergies   REVIEW OF SYSTEMS:  GENERAL: no change in appetite, no fatigue, no weight changes, no fever, chills or weakness RESPIRATORY: no  cough, SOB, DOE, wheezing, hemoptysis CARDIAC: no chest pain, edema or palpitations GI: no abdominal pain, diarrhea, constipation, heart burn, nausea or vomiting  PHYSICAL EXAMINATION  GENERAL: no acute distress, normal body habitus EYES: conjunctivae normal, sclerae normal, normal eye lids NECK: supple, trachea midline, no neck masses, no thyroid tenderness, no thyromegaly LYMPHATICS: no LAN in the neck, no supraclavicular LAN RESPIRATORY: breathing is even & unlabored, BS CTAB CARDIAC: RRR, no murmur,no extra heart sounds, no edema GI: abdomen soft, normal BS, no masses, no tenderness, no hepatomegaly, no splenomegaly EXTREMITIES:  PSYCHIATRIC: the patient is alert & oriented to person, affect & behavior appropriate  LABS/RADIOLOGY:  Dg Lumbar Spine Complete  04/23/2014   CLINICAL DATA:  Status post fall at nursing home on 04/23/2014. Initial encounter. Low back pain.  EXAM: LUMBAR SPINE - COMPLETE 4+ VIEW  COMPARISON:  None.  FINDINGS: There are 5 nonrib bearing lumbar-type vertebral bodies. The vertebral body heights are maintained. The alignment is anatomic. There is no spondylolysis. There is no acute fracture or static listhesis. There is degenerative disc disease at L1-2, L2-3 and L5-S1. There is bilateral facet arthropathy at L3-4, L4-5 and L5-S1.  The SI joints are unremarkable.  IMPRESSION: No acute osseous injury of the lumbar spine.   Electronically Signed   By: Kathreen Devoid  On: 04/23/2014 21:28   Dg Pelvis 1-2 Views  04/23/2014   CLINICAL DATA:  Right lower extremity pain after fall at nursing home, initial encounter.  EXAM: PELVIS - 1-2 VIEW  COMPARISON:  March 13, 2013.  FINDINGS: There is no evidence of pelvic fracture or diastasis. No pelvic bone lesions are seen. Mild degenerative disease is seen involving both hip joints.  IMPRESSION: Mild degenerative joint disease of both hips. No fracture or dislocation is noted.   Electronically Signed   By: Sabino Dick M.D.   On:  04/23/2014 21:29   Dg Femur Right  04/23/2014   CLINICAL DATA:  Status post fall at nursing home. Initial encounter.  EXAM: RIGHT FEMUR - 2 VIEW  COMPARISON:  None.  FINDINGS: No acute fracture or dislocation. Total right knee arthroplasty. Moderate osteoarthritis of the right hip. No lytic or sclerotic osseous lesion. Unremarkable soft tissues.  IMPRESSION: 1. No acute osseous injury of the right femur. 2. Moderate osteoarthritis of the right hip.   Electronically Signed   By: Kathreen Devoid   On: 04/23/2014 21:30   Ct Head Wo Contrast  04/23/2014   CLINICAL DATA:  Initial evaluation for acute trauma, fall.  EXAM: CT HEAD WITHOUT CONTRAST  CT CERVICAL SPINE WITHOUT CONTRAST  TECHNIQUE: Multidetector CT imaging of the head and cervical spine was performed following the standard protocol without intravenous contrast. Multiplanar CT image reconstructions of the cervical spine were also generated.  COMPARISON:  Prior study from 09/02/2013.  FINDINGS: CT HEAD FINDINGS  Mild diffuse prominence of the CSF containing spaces is compatible with generalized cerebral atrophy, normal for patient age. Minimal hypodensity within the periventricular and deep white matter both cerebral hemispheres is consistent with mild chronic small vessel ischemic changes.  There is no acute intracranial hemorrhage or infarct. No mass lesion or midline shift. Gray-white matter differentiation is well maintained. Ventricles are normal in size without evidence of hydrocephalus. CSF containing spaces are within normal limits. No extra-axial fluid collection.  The calvarium is intact.  Orbital soft tissues are within normal limits.  There is partial opacification of the sphenoid sinus and ethmoidal air cells. No mastoid effusion.  Scalp soft tissues are unremarkable.  CT CERVICAL SPINE FINDINGS  The patient is status post ACDF at the C3-C4 level. Hardware is stable in alignment without evidence of failure or complication.  Vertebral bodies are  normally aligned with preservation of the normal cervical lordosis. Normal C1-2 articulations are intact. Prevertebral soft tissues are normal. No acute fracture listhesis.  Moderate multilevel degenerative disc disease is evidenced by intervertebral disc space narrowing, endplate sclerosis, and osteophytosis seen throughout the cervical spine, most severe at C6-7.  Visualized soft tissues of the neck are within normal limits.  Visualized lung apices are clear.  IMPRESSION: CT BRAIN:  1. No intracranial process. 2. Moderate sphenoethmoidal sinus disease.  CT CERVICAL SPINE:  1. No acute traumatic injury within the cervical spine. 2. ACDF at Q3-3 without complication.   Electronically Signed   By: Jeannine Boga M.D.   On: 04/23/2014 21:32   Ct Cervical Spine Wo Contrast  04/23/2014   CLINICAL DATA:  Initial evaluation for acute trauma, fall.  EXAM: CT HEAD WITHOUT CONTRAST  CT CERVICAL SPINE WITHOUT CONTRAST  TECHNIQUE: Multidetector CT imaging of the head and cervical spine was performed following the standard protocol without intravenous contrast. Multiplanar CT image reconstructions of the cervical spine were also generated.  COMPARISON:  Prior study from 09/02/2013.  FINDINGS: CT HEAD FINDINGS  Mild diffuse prominence of the CSF containing spaces is compatible with generalized cerebral atrophy, normal for patient age. Minimal hypodensity within the periventricular and deep white matter both cerebral hemispheres is consistent with mild chronic small vessel ischemic changes.  There is no acute intracranial hemorrhage or infarct. No mass lesion or midline shift. Gray-white matter differentiation is well maintained. Ventricles are normal in size without evidence of hydrocephalus. CSF containing spaces are within normal limits. No extra-axial fluid collection.  The calvarium is intact.  Orbital soft tissues are within normal limits.  There is partial opacification of the sphenoid sinus and ethmoidal air  cells. No mastoid effusion.  Scalp soft tissues are unremarkable.  CT CERVICAL SPINE FINDINGS  The patient is status post ACDF at the C3-C4 level. Hardware is stable in alignment without evidence of failure or complication.  Vertebral bodies are normally aligned with preservation of the normal cervical lordosis. Normal C1-2 articulations are intact. Prevertebral soft tissues are normal. No acute fracture listhesis.  Moderate multilevel degenerative disc disease is evidenced by intervertebral disc space narrowing, endplate sclerosis, and osteophytosis seen throughout the cervical spine, most severe at C6-7.  Visualized soft tissues of the neck are within normal limits.  Visualized lung apices are clear.  IMPRESSION: CT BRAIN:  1. No intracranial process. 2. Moderate sphenoethmoidal sinus disease.  CT CERVICAL SPINE:  1. No acute traumatic injury within the cervical spine. 2. ACDF at H6-5 without complication.   Electronically Signed   By: Jeannine Boga M.D.   On: 04/23/2014 21:32    ASSESSMENT/PLAN:  CPT CODE: [place], [place]

## 2014-05-20 ENCOUNTER — Other Ambulatory Visit: Payer: Self-pay | Admitting: Adult Health

## 2014-05-22 HISTORY — PX: UPPER GASTROINTESTINAL ENDOSCOPY: SHX188

## 2014-05-27 DIAGNOSIS — M5387 Other specified dorsopathies, lumbosacral region: Secondary | ICD-10-CM | POA: Diagnosis not present

## 2014-05-27 DIAGNOSIS — G894 Chronic pain syndrome: Secondary | ICD-10-CM | POA: Diagnosis not present

## 2014-05-27 DIAGNOSIS — M79673 Pain in unspecified foot: Secondary | ICD-10-CM | POA: Diagnosis not present

## 2014-05-27 DIAGNOSIS — M797 Fibromyalgia: Secondary | ICD-10-CM | POA: Diagnosis not present

## 2014-05-27 DIAGNOSIS — M549 Dorsalgia, unspecified: Secondary | ICD-10-CM | POA: Diagnosis not present

## 2014-05-27 DIAGNOSIS — M47817 Spondylosis without myelopathy or radiculopathy, lumbosacral region: Secondary | ICD-10-CM | POA: Diagnosis not present

## 2014-05-27 DIAGNOSIS — M542 Cervicalgia: Secondary | ICD-10-CM | POA: Diagnosis not present

## 2014-05-27 DIAGNOSIS — Z79899 Other long term (current) drug therapy: Secondary | ICD-10-CM | POA: Diagnosis not present

## 2014-06-11 DIAGNOSIS — M12571 Traumatic arthropathy, right ankle and foot: Secondary | ICD-10-CM | POA: Diagnosis not present

## 2014-06-11 DIAGNOSIS — M76821 Posterior tibial tendinitis, right leg: Secondary | ICD-10-CM | POA: Diagnosis not present

## 2014-06-25 DIAGNOSIS — G894 Chronic pain syndrome: Secondary | ICD-10-CM | POA: Diagnosis not present

## 2014-06-25 DIAGNOSIS — M961 Postlaminectomy syndrome, not elsewhere classified: Secondary | ICD-10-CM | POA: Diagnosis not present

## 2014-06-25 DIAGNOSIS — Z79899 Other long term (current) drug therapy: Secondary | ICD-10-CM | POA: Diagnosis not present

## 2014-06-25 DIAGNOSIS — M549 Dorsalgia, unspecified: Secondary | ICD-10-CM | POA: Diagnosis not present

## 2014-06-30 ENCOUNTER — Other Ambulatory Visit: Payer: Self-pay | Admitting: Internal Medicine

## 2014-06-30 ENCOUNTER — Other Ambulatory Visit: Payer: Self-pay | Admitting: Adult Health

## 2014-06-30 ENCOUNTER — Other Ambulatory Visit: Payer: Self-pay | Admitting: Nurse Practitioner

## 2014-07-01 ENCOUNTER — Telehealth: Payer: Self-pay | Admitting: Internal Medicine

## 2014-07-01 NOTE — Telephone Encounter (Signed)
Patient states he had pharmacy to send in refill request on some meds that he is taking.  The pharmacy stated that we would not fill them because we had dismissed him from the practice.  Can you please confirm?  Patient was needing 11 scripts refilled.

## 2014-07-01 NOTE — Telephone Encounter (Signed)
One medication was already done.  Pt stated that he worked it out with the pharmacist and they will send over more.  Nothing further needed at this time

## 2014-07-01 NOTE — Telephone Encounter (Signed)
Pt has been dismissed from [facility]  Has not been dismissed from [facility] to address routine refills per [facility]

## 2014-07-10 ENCOUNTER — Other Ambulatory Visit: Payer: Self-pay | Admitting: Cardiology

## 2014-07-13 ENCOUNTER — Other Ambulatory Visit: Payer: Self-pay

## 2014-07-13 MED ORDER — PRASUGREL HCL 10 MG PO TABS: 10.0000 mg | ORAL_TABLET | Freq: Every day | ORAL | Status: DC

## 2014-07-14 ENCOUNTER — Ambulatory Visit (INDEPENDENT_AMBULATORY_CARE_PROVIDER_SITE_OTHER)
Admission: RE | Admit: 2014-07-14 | Discharge: 2014-07-14 | Disposition: A | Discharge status: Home / Self Care | Payer: Medicare HMO | Source: Ambulatory Visit | Attending: Internal Medicine | Admitting: Internal Medicine

## 2014-07-14 ENCOUNTER — Other Ambulatory Visit (INDEPENDENT_AMBULATORY_CARE_PROVIDER_SITE_OTHER): Payer: Commercial Managed Care - HMO

## 2014-07-14 ENCOUNTER — Encounter: Payer: Self-pay | Admitting: Internal Medicine

## 2014-07-14 ENCOUNTER — Ambulatory Visit (INDEPENDENT_AMBULATORY_CARE_PROVIDER_SITE_OTHER): Payer: Commercial Managed Care - HMO | Admitting: Internal Medicine

## 2014-07-14 VITALS — BP 110/68 | HR 73 | Temp 97.8°F | Resp 18 | Ht 73.0 in | Wt 236.1 lb

## 2014-07-14 DIAGNOSIS — R531 Weakness: Secondary | ICD-10-CM

## 2014-07-14 DIAGNOSIS — I10 Essential (primary) hypertension: Secondary | ICD-10-CM | POA: Diagnosis not present

## 2014-07-14 DIAGNOSIS — E1059 Type 1 diabetes mellitus with other circulatory complications: Secondary | ICD-10-CM

## 2014-07-14 DIAGNOSIS — E1051 Type 1 diabetes mellitus with diabetic peripheral angiopathy without gangrene: Secondary | ICD-10-CM

## 2014-07-14 DIAGNOSIS — E1065 Type 1 diabetes mellitus with hyperglycemia: Secondary | ICD-10-CM

## 2014-07-14 DIAGNOSIS — IMO0002 Reserved for concepts with insufficient information to code with codable children: Secondary | ICD-10-CM

## 2014-07-14 DIAGNOSIS — R296 Repeated falls: Secondary | ICD-10-CM

## 2014-07-14 DIAGNOSIS — F32A Depression, unspecified: Secondary | ICD-10-CM

## 2014-07-14 DIAGNOSIS — F329 Major depressive disorder, single episode, unspecified: Secondary | ICD-10-CM | POA: Diagnosis not present

## 2014-07-14 DIAGNOSIS — E119 Type 2 diabetes mellitus without complications: Secondary | ICD-10-CM | POA: Diagnosis not present

## 2014-07-14 DIAGNOSIS — R32 Unspecified urinary incontinence: Secondary | ICD-10-CM | POA: Insufficient documentation

## 2014-07-14 HISTORY — DX: Weakness: R53.1

## 2014-07-14 LAB — CBC WITH DIFFERENTIAL/PLATELET
Basophils Absolute: 0 10*3/uL (ref 0.0–0.1)
Basophils Relative: 0.2 % (ref 0.0–3.0)
EOS ABS: 0.2 10*3/uL (ref 0.0–0.7)
Eosinophils Relative: 3.9 % (ref 0.0–5.0)
HEMATOCRIT: 33 % — AB (ref 39.0–52.0)
HEMOGLOBIN: 11.1 g/dL — AB (ref 13.0–17.0)
LYMPHS PCT: 29.1 % (ref 12.0–46.0)
Lymphs Abs: 1.4 10*3/uL (ref 0.7–4.0)
MCHC: 33.5 g/dL (ref 30.0–36.0)
MCV: 89.2 fl (ref 78.0–100.0)
MONOS PCT: 8.1 % (ref 3.0–12.0)
Monocytes Absolute: 0.4 10*3/uL (ref 0.1–1.0)
NEUTROS ABS: 2.7 10*3/uL (ref 1.4–7.7)
Neutrophils Relative %: 58.7 % (ref 43.0–77.0)
Platelets: 224 10*3/uL (ref 150.0–400.0)
RBC: 3.7 Mil/uL — ABNORMAL LOW (ref 4.22–5.81)
RDW: 15.4 % (ref 11.5–15.5)
WBC: 4.7 10*3/uL (ref 4.0–10.5)

## 2014-07-14 LAB — LIPID PANEL
CHOL/HDL RATIO: 3
Cholesterol: 108 mg/dL (ref 0–200)
HDL: 36.3 mg/dL — AB (ref >39.00)
LDL Cholesterol: 57 mg/dL (ref 0–99)
NonHDL: 71.7
TRIGLYCERIDES: 75 mg/dL (ref 0.0–149.0)
VLDL: 15 mg/dL (ref 0.0–40.0)

## 2014-07-14 LAB — HEMOGLOBIN A1C: HEMOGLOBIN A1C: 5.6 % (ref 4.6–6.5)

## 2014-07-14 LAB — URINALYSIS, ROUTINE W REFLEX MICROSCOPIC
Bilirubin Urine: NEGATIVE
Hgb urine dipstick: NEGATIVE
Ketones, ur: NEGATIVE
LEUKOCYTES UA: NEGATIVE
Nitrite: NEGATIVE
RBC / HPF: NONE SEEN (ref 0–?)
Specific Gravity, Urine: 1.015 (ref 1.000–1.030)
Total Protein, Urine: NEGATIVE
UROBILINOGEN UA: 0.2 (ref 0.0–1.0)
Urine Glucose: NEGATIVE
pH: 6 (ref 5.0–8.0)

## 2014-07-14 LAB — HEPATIC FUNCTION PANEL
ALT: 8 U/L (ref 0–53)
AST: 14 U/L (ref 0–37)
Albumin: 4.1 g/dL (ref 3.5–5.2)
Alkaline Phosphatase: 48 U/L (ref 39–117)
BILIRUBIN TOTAL: 0.3 mg/dL (ref 0.2–1.2)
Bilirubin, Direct: 0.1 mg/dL (ref 0.0–0.3)
TOTAL PROTEIN: 7 g/dL (ref 6.0–8.3)

## 2014-07-14 LAB — BASIC METABOLIC PANEL
BUN: 26 mg/dL — ABNORMAL HIGH (ref 6–23)
CHLORIDE: 105 meq/L (ref 96–112)
CO2: 31 meq/L (ref 19–32)
CREATININE: 0.89 mg/dL (ref 0.40–1.50)
Calcium: 9.3 mg/dL (ref 8.4–10.5)
GFR: 92.02 mL/min (ref >60.00)
Glucose, Bld: 104 mg/dL — ABNORMAL HIGH (ref 70–99)
POTASSIUM: 4.2 meq/L (ref 3.5–5.1)
SODIUM: 141 meq/L (ref 135–145)

## 2014-07-14 LAB — TSH: TSH: 0.91 u[IU]/mL (ref 0.35–4.50)

## 2014-07-14 MED ORDER — METFORMIN HCL 500 MG PO TABS: 500.0000 mg | ORAL_TABLET | Freq: Every day | ORAL | Status: DC

## 2014-07-14 NOTE — Assessment & Plan Note (Signed)
For urine UA and cx, also refer urology

## 2014-07-14 NOTE — Assessment & Plan Note (Addendum)
Etiology unclear, orthostatics checked today, for labs and cxr as ordered, also refer for outpatient PT  Note:  Total time for pt hx, exam, review of record with pt in the room, determination of diagnoses and plan for further eval and tx is > 40 min, with over 50% spent in coordination and counseling of patient

## 2014-07-14 NOTE — Assessment & Plan Note (Addendum)
With borderline low BP, and recent falls, to d/c amlodipine, ECG reviewed as per emr

## 2014-07-14 NOTE — Progress Notes (Signed)
Subjective:    Patient ID: Travis [name] Sr., male    DOB: April 23, 1951, 64 y.o.   MRN: [id number]  HPI  Here to f/u; has been feleling poorly for the past mo,  Pt denies chest pain, increased sob or doe, wheezing, orthopnea, PND, increased LE swelling, palpitations,  or syncope, but has had dizziness with standing with weakness especialy when first trying to get out of be either in the AM, or after lying down during the day. .  Pt denies polydipsia, polyuria, or low sugar symptoms such as weakness or confusion improved with po intake.  Pt denies new neurological symptoms such as new headache, or facial or extremity weakness or numbness.   Pt states overall good compliance with meds, has been trying to follow lower cholesterol, diabetic diet, with wt overall stable,  but little exercise however.  Peak wt of 380 in past, now down with better diet over 1.5 yrs.  Walking with wlaker today due to recent right foot surgury, and weakness. He has a driver today. Also had lumbar and cervical disc surgury recently as well per Dr Eudelia Bunch. Wt Readings from Last 3 Encounters:  07/14/14 236 lb 1.3 oz (107.085 kg)  05/06/14 254 lb 9.6 oz (115.486 kg)  04/30/14 251 lb (113.853 kg)  Denies worsening depressive symptoms, suicidal ideation, or panic;requests depakote level with labs  Also with 5 falls and daily urinary incontinence in the past month for unclear reason.  Denies urinary symptoms such as dysuria, frequency, urgency, flank pain, hematuria or n/v, fever, chills. Denies worsening reflux, abd pain, dysphagia, n/v, bowel change or blood. Past Medical History  Diagnosis Date  . Impaired glucose tolerance 09/27/2010  . HYPERLIPIDEMIA-MIXED 07/15/2009  . Chronic pain syndrome 10/27/2009  . HYPERTENSION 06/24/2009  . CORONARY ARTERY DISEASE 06/24/2009    a. s/p multiple PCIs - In 2008 he had a Taxus DES to the mild LAD, Endeavor DES to mid LCX and distal LCX. In January 2009 he had DES to distal LCX, mid LCX and  proximal LCX. In November 2009 had BMS x 2 to the mid RCA. Cath 10/2011 with patent stents, noncardiac CP. LHC 01/2013: patent stents (noncardiac CP).  [name] URETHRAL STRICTURE 06/24/2009  . SHOULDER PAIN, BILATERAL 06/24/2009  . ANKLE PAIN, RIGHT 06/24/2009  . SCIATICA, LEFT 04/19/2010  . NEPHROLITHIASIS, HX OF 06/24/2009  . ERECTILE DYSFUNCTION, ORGANIC 05/30/2010  . Obesity   . ALLERGIC RHINITIS 06/24/2009  . Anxiety   . Depression     Prior suicide attempt  . Irregular heart beat   . Pneumonia   . Self-catheterizes urinary bladder   . Anginal pain   . Type II diabetes mellitus   . Hepatitis C dx'd 1979  . DEGENERATIVE JOINT DISEASE 06/24/2009  . [place] DISEASE, LUMBAR 04/19/2010  . Arthritis     "all my joints" (09/30/2013)  . Fibromyalgia     "left leg" (09/30/2013)  . Chronic back pain     "my whole back" (09/30/2013)  . Kidney stones     "passed on my own"  . Myocardial infarction 2008  . OSA (obstructive sleep apnea)     not using CPAP (09/30/2013)  . Heart murmur    Past Surgical History  Procedure Laterality Date  . Shoulder open rotator cuff repair Right X 2  . Total knee arthroplasty Bilateral 2008  . Carpal tunnel release Bilateral   . Urethral dilation  X 4  . Knee arthroscopy Right X 7  . Umbilical hernia repair  UHR  . Hernia repair      umbilical  . Shoulder arthroscopy w/ rotator cuff repair Bilateral     "3on the left; 2 on the right"  . Tonsillectomy    . Knee cartilage surgery Right X 4    "open; not scopes"  . Knee cartilage surgery Left X 3    "open; not scopes"  . Coronary angioplasty with stent placement      "I have 9 stents"  . Cardiac catheterization  X 1  . Anterior cervical decomp/discectomy fusion N/A 09/26/2013    Procedure: ANTERIOR CERVICAL DISCECTOMY FUSION C3-4, plate and screw fixation, allograft bone graft;  Surgeon: Jessy Oto, MD;  Location: [place];  Service: Orthopedics;  Laterality: N/A;  . Laminectomy  01/28/2014    L3 L4 L5  . Lumbar  laminectomy/decompression microdiscectomy N/A 01/27/2014    Procedure: CENTRAL LUMBAR LAMINECTOMY L4-5 AND L3-4;  Surgeon: Jessy Oto, MD;  Location: [place];  Service: Orthopedics;  Laterality: N/A;  . Joint replacement Bilateral     knees  . Colonoscopy    . Vasectomy    . Fusion of talonavicular joint Right 04/15/2014    dr duda  . Ankle fusion Right 04/15/2014    Procedure: Right Subtalar, Talonavicular Fusion;  Surgeon: Newt Minion, MD;  Location: [place];  Service: Orthopedics;  Laterality: Right;  . Left heart catheterization with coronary angiogram N/A 02/10/2013    Procedure: LEFT HEART CATHETERIZATION WITH CORONARY ANGIOGRAM;  Surgeon: Burnell Blanks, MD;  Location: [facility] CATH LAB;  Service: Cardiovascular;  Laterality: N/A;    reports that he quit smoking about 3 years ago. His smoking use included Cigars. He has never used smokeless tobacco. He reports that he does not drink alcohol or use illicit drugs. family history includes Cancer in his father and mother; Coronary artery disease in his other; Depression in his brother, mother, and other; Diabetes in his father; Early death in his paternal grandfather; Early death (age of onset: 23) in his maternal grandfather; Heart disease in his father, maternal grandfather, and mother; Hyperlipidemia in his father; Hypertension in his brother, father, mother, and other. No Known Allergies Current Outpatient Prescriptions on File Prior to Visit  Medication Sig Dispense Refill  . amLODipine (NORVASC) 5 MG tablet Take 1 tablet (5 mg total) by mouth daily.    . ARIPiprazole (ABILIFY) 5 MG tablet Take 1 tablet (5 mg total) by mouth daily. 90 tablet 0  . aspirin 81 MG tablet Take 1 tablet (81 mg total) by mouth daily. 100 tablet 0  . atorvastatin (LIPITOR) 20 MG tablet Take 20 mg by mouth daily.    . benazepril (LOTENSIN) 20 MG tablet Take 1 tablet (20 mg total) by mouth daily. HOLD for next 2 days    . benazepril (LOTENSIN) 20 MG tablet TAKE  1 TABLET BY MOUTH EVERY DAY 90 tablet 0  . clonazePAM (KLONOPIN) 2 MG tablet Take 1 tablet (2 mg total) by mouth 2 (two) times daily as needed (sleep, anxiety). 60 tablet 0  . divalproex (DEPAKOTE) 250 MG DR tablet Take 1-2 tablets (250-500 mg total) by mouth 2 (two) times daily. 250 mg in morning and 500 mg at bedtime 90 tablet 3  . docusate sodium 100 MG CAPS Take 100 mg by mouth 2 (two) times daily. 100 capsule 0  . EFFIENT 10 MG TABS tablet TAKE 1 TABLET BY MOUTH DAILY 30 tablet 1  . ferrous gluconate (FERGON) 324 MG tablet Take 1  tablet (324 mg total) by mouth 3 (three) times daily with meals. 60 tablet 3  . FLUoxetine (PROZAC) 40 MG capsule Take 1 capsule (40 mg total) by mouth daily. 90 capsule 0  . fluticasone (FLONASE) 50 MCG/ACT nasal spray Place 1 spray into both nostrils daily.    [name] gabapentin (NEURONTIN) 400 MG capsule Take 2 capsules (800 mg total) by mouth 4 (four) times daily. 240 capsule 3  . isosorbide mononitrate (IMDUR) 60 MG 24 hr tablet Take 1 tablet (60 mg total) by mouth daily. HOLD for next 2 days    . metFORMIN (GLUCOPHAGE) 500 MG tablet Take 500 mg by mouth 2 (two) times daily with a meal.     . methocarbamol (ROBAXIN) 500 MG tablet Take 1 tablet (500 mg total) by mouth every 6 (six) hours as needed for muscle spasms. 120 tablet 3  . Omega-3 Fatty Acids (FISH OIL) 1000 MG CAPS Take 2 capsules by mouth daily.    [name] oxyCODONE (ROXICODONE) 15 MG immediate release tablet Take 1 tablet (15 mg total) by mouth every 4 (four) hours as needed. 90 tablet 0  . oxyCODONE (ROXICODONE) 15 MG immediate release tablet Take one tablet by mouth every 3 hours as needed for moderate pain; Take two tablets by mouth every 3 hours as needed for severe pain 360 tablet 0  . polyethylene glycol (MIRALAX / GLYCOLAX) packet Take 17 g by mouth 2 (two) times daily. (Patient taking differently: Take 17 g by mouth daily. ) 60 each 3  . prasugrel (EFFIENT) 10 MG TABS tablet Take 1 tablet (10 mg total) by  mouth daily. 30 tablet 1  . sildenafil (VIAGRA) 50 MG tablet Take 1 tablet (50 mg total) by mouth daily as needed for erectile dysfunction. 10 tablet 0  . tamsulosin (FLOMAX) 0.4 MG CAPS capsule Take 0.4 mg by mouth daily after breakfast.    . traZODone (DESYREL) 100 MG tablet Take 1 tablet (100 mg total) by mouth at bedtime. 90 tablet 0   No current facility-administered medications on file prior to visit.     Review of Systems  Constitutional: Negative for unusual diaphoresis or other sweats  HENT: Negative for ringing in ear Eyes: Negative for double vision or worsening visual disturbance.  Respiratory: Negative for choking and stridor.   Gastrointestinal: Negative for vomiting or other signifcant bowel change Genitourinary: Negative for hematuria or decreased urine volume.  Musculoskeletal: Negative for other MSK pain or swelling except post op right foot Skin: Negative for color change and worsening wound.  Neurological: Negative for tremors and numbness other than noted  Psychiatric/Behavioral: Negative for decreased concentration or agitation other than above     ECG reviewed as per emr    Objective:   Physical Exam BP 110/68 mmHg  Pulse 73  Temp(Src) 97.8 F (36.6 C) (Oral)  Resp 18  Ht 6' 1"  (1.854 m)  Wt 236 lb 1.3 oz (107.085 kg)  BMI 31.15 kg/m2  SpO2 98% VS noted, including orthostatics - not clearly orthostatic Constitutional: Pt appears well-developed, well-nourished.  HENT: Head: NCAT.  Right Ear: External ear normal.  Left Ear: External ear normal.  Eyes: . Pupils are equal, round, and reactive to light. Conjunctivae and EOM are normal Neck: Normal range of motion. Neck supple.  Cardiovascular: Normal rate and regular rhythm.   Pulmonary/Chest: Effort normal and breath sounds without rales or wheezing.  Abd:  Soft, NT, ND, + BS Neurological: Pt is alert. Not confused , motor grossly intact Skin: Skin  is warm. No rash Psychiatric: Pt behavior is normal.  No agitation. seems fatigued, weak, mild dysphoric    Assessment & Plan:

## 2014-07-14 NOTE — Assessment & Plan Note (Signed)
With wt loss, can most likely decr the metformin to 500 qd, also for lab a1c today,  to f/u any worsening symptoms or concerns

## 2014-07-14 NOTE — Progress Notes (Signed)
Pre visit review using our clinic review tool, if applicable. No additional management support is needed unless otherwise documented below in the visit note. 

## 2014-07-14 NOTE — Assessment & Plan Note (Signed)
Overall stable, for depakote level today

## 2014-07-14 NOTE — Patient Instructions (Addendum)
Ok to decrease the metformin to 500 mg in the AM only  OK to stop the amlodipine  Please continue all other medications as before, and refills have been done if requested.  Please have the pharmacy call with any other refills you may need.  Please keep your appointments with your specialists as you may have planned  Please go to the XRAY Department in the Basement (go straight as you get off the elevator) for the x-ray testing  Please go to the LAB in the Basement (turn left off the elevator) for the tests to be done today  You will be contacted regarding the referral for: urology  - Dr Janice Norrie, and outpatient Physical Therapy

## 2014-07-15 LAB — VALPROIC ACID LEVEL: Valproic Acid Lvl: 46.3 ug/mL — ABNORMAL LOW (ref 50.0–100.0)

## 2014-07-15 LAB — URINE CULTURE
Colony Count: NO GROWTH
Organism ID, Bacteria: NO GROWTH

## 2014-07-22 DIAGNOSIS — G894 Chronic pain syndrome: Secondary | ICD-10-CM | POA: Diagnosis not present

## 2014-07-22 DIAGNOSIS — Z79899 Other long term (current) drug therapy: Secondary | ICD-10-CM | POA: Diagnosis not present

## 2014-07-22 DIAGNOSIS — M961 Postlaminectomy syndrome, not elsewhere classified: Secondary | ICD-10-CM | POA: Diagnosis not present

## 2014-07-22 DIAGNOSIS — M542 Cervicalgia: Secondary | ICD-10-CM | POA: Diagnosis not present

## 2014-07-22 DIAGNOSIS — M47817 Spondylosis without myelopathy or radiculopathy, lumbosacral region: Secondary | ICD-10-CM | POA: Diagnosis not present

## 2014-07-22 DIAGNOSIS — M549 Dorsalgia, unspecified: Secondary | ICD-10-CM | POA: Diagnosis not present

## 2014-07-22 DIAGNOSIS — M5387 Other specified dorsopathies, lumbosacral region: Secondary | ICD-10-CM | POA: Diagnosis not present

## 2014-07-29 ENCOUNTER — Ambulatory Visit (INDEPENDENT_AMBULATORY_CARE_PROVIDER_SITE_OTHER): Payer: Medicare HMO | Admitting: Psychology

## 2014-07-29 ENCOUNTER — Ambulatory Visit (INDEPENDENT_AMBULATORY_CARE_PROVIDER_SITE_OTHER): Payer: Medicare HMO | Admitting: Psychiatry

## 2014-07-29 ENCOUNTER — Encounter (HOSPITAL_COMMUNITY): Payer: Self-pay | Admitting: Psychiatry

## 2014-07-29 VITALS — BP 96/60 | HR 66 | Ht 73.0 in | Wt 236.2 lb

## 2014-07-29 DIAGNOSIS — F319 Bipolar disorder, unspecified: Secondary | ICD-10-CM

## 2014-07-29 DIAGNOSIS — F332 Major depressive disorder, recurrent severe without psychotic features: Secondary | ICD-10-CM

## 2014-07-29 DIAGNOSIS — F39 Unspecified mood [affective] disorder: Secondary | ICD-10-CM | POA: Diagnosis not present

## 2014-07-29 MED ORDER — ARIPIPRAZOLE 5 MG PO TABS: 5.0000 mg | ORAL_TABLET | Freq: Every day | ORAL | Status: DC

## 2014-07-29 MED ORDER — TRAZODONE HCL 100 MG PO TABS: 100.0000 mg | ORAL_TABLET | Freq: Every day | ORAL | Status: DC

## 2014-07-29 MED ORDER — FLUOXETINE HCL 40 MG PO CAPS: 40.0000 mg | ORAL_CAPSULE | Freq: Every day | ORAL | Status: DC

## 2014-07-29 NOTE — Progress Notes (Signed)
[facility] [phone] Progress Note  Travis [name] [id number] y.o.  07/29/2014 9:06 AM  Chief Complaint:  Medication management and follow-up.     History of Present Illness:  Travis [name] came for his followup appointment.  He is taking his medication Abilify, trazodone and Prozac from this writer and he also takes Klonopin, gabapentin and Depakote from other providers.  He reported he is doing better despite he has lot of chronic pain and health issues.  He sleeping good.  He has no tremors or shakes.  He has difficulty walking because of chronic back pain.  However he mentioned his mood swing, irritability, anger and depression is under control.  He is seeing Dr. Cheryln Manly regularly for counseling.  Recently he visited his primary care physician and he do not believe there has been any significant changes in his medication.  His appetite is okay.  His vitals are stable.  He is living with his brother who is very supportive.  Patient denies drinking or using any illegal substances.    Suicidal Ideation: No Plan Formed: No Patient has means to carry out plan: No  Homicidal Ideation: No Plan Formed: No Patient has means to carry out plan: No  Past Psychiatric History/Hospitalization(s) Patient has history of bipolar disorder and he is seeing psychiatrists since age 54.  He has seen psychiatrist in [place] and [place].  He has history of severe mood swings , anger issues, hallucination, irritability, poor impulse control and risky behavior.  He married 5 times and he has 2 children but patient has no contact with them.  He spent $13,000 on his friend which was given to him to use only as needed .  Patient has one psychiatric hospitalization in favor 8 2014 when he took overdose on Xanax with alcohol.  However he has at least history of 2 suicidal gesture in the past.  Patient also attended intensive outpatient program in the past.  He has seen psychiatrist in [place] and [place] in the  past.  He had tried Effexor, Xanax, Klonopin, Ambien , Lunesta, Wellbutrin, Ambien and Remeron.  He was started on Abilify from this office.  Anxiety: Yes Bipolar Disorder: Yes Depression: Yes Mania: Yes Psychosis: Yes Schizophrenia: No Personality Disorder: No Hospitalization for psychiatric illness: Yes History of Electroconvulsive Shock Therapy: No Prior Suicide Attempts: Yes  Medical History; Patient has hypertension, coronary artery disease, allergic rhinitis, type 2 diabetes mellitus, left leg pain, degenerative joint disease, chronic back pain, urethral stricture, and about his function, hyperlipidemia, nephrolithiasis and chronic fatigue.  His primary care physician is Dr. Kandee Keen.  Review of Systems  Musculoskeletal: Positive for back pain, joint pain and neck pain.  Psychiatric/Behavioral: Negative for suicidal ideas and substance abuse.    Psychiatric: Agitation: No Hallucination: No Depressed Mood: No Insomnia: No Hypersomnia: No Altered Concentration: No Feels Worthless: No Grandiose Ideas: No Belief In Special Powers: No New/Increased Substance Abuse: No Compulsions: No  Neurologic: Headache: No Seizure: No Paresthesias: Yes    Outpatient Encounter Prescriptions as of 07/29/2014  Medication Sig  . ARIPiprazole (ABILIFY) 5 MG tablet Take 1 tablet (5 mg total) by mouth daily.  [name] aspirin 81 MG tablet Take 1 tablet (81 mg total) by mouth daily.  [name] atorvastatin (LIPITOR) 20 MG tablet Take 20 mg by mouth daily.  . benazepril (LOTENSIN) 20 MG tablet TAKE 1 TABLET BY MOUTH EVERY DAY  . clonazePAM (KLONOPIN) 2 MG tablet Take 1 tablet (2 mg total) by mouth 2 (two) times daily  as needed (sleep, anxiety).  [name] divalproex (DEPAKOTE) 250 MG DR tablet Take 1-2 tablets (250-500 mg total) by mouth 2 (two) times daily. 250 mg in morning and 500 mg at bedtime  . docusate sodium 100 MG CAPS Take 100 mg by mouth 2 (two) times daily.  [name] EFFIENT 10 MG TABS tablet TAKE 1 TABLET  BY MOUTH DAILY  . ferrous gluconate (FERGON) 324 MG tablet Take 1 tablet (324 mg total) by mouth 3 (three) times daily with meals.  [name] FLUoxetine (PROZAC) 40 MG capsule Take 1 capsule (40 mg total) by mouth daily.  . fluticasone (FLONASE) 50 MCG/ACT nasal spray Place 1 spray into both nostrils daily.  [name] gabapentin (NEURONTIN) 400 MG capsule Take 2 capsules (800 mg total) by mouth 4 (four) times daily.  . isosorbide mononitrate (IMDUR) 60 MG 24 hr tablet Take 1 tablet (60 mg total) by mouth daily. HOLD for next 2 days  . metFORMIN (GLUCOPHAGE) 500 MG tablet Take 1 tablet (500 mg total) by mouth daily with breakfast.  . methocarbamol (ROBAXIN) 500 MG tablet Take 1 tablet (500 mg total) by mouth every 6 (six) hours as needed for muscle spasms.  . Omega-3 Fatty Acids (FISH OIL) 1000 MG CAPS Take 2 capsules by mouth daily.  . polyethylene glycol (MIRALAX / GLYCOLAX) packet Take 17 g by mouth 2 (two) times daily. (Patient taking differently: Take 17 g by mouth daily. )  . prasugrel (EFFIENT) 10 MG TABS tablet Take 1 tablet (10 mg total) by mouth daily.  . sildenafil (VIAGRA) 50 MG tablet Take 1 tablet (50 mg total) by mouth daily as needed for erectile dysfunction.  . tamsulosin (FLOMAX) 0.4 MG CAPS capsule Take 0.4 mg by mouth daily after breakfast.  . traZODone (DESYREL) 100 MG tablet Take 1 tablet (100 mg total) by mouth at bedtime.  . [DISCONTINUED] ARIPiprazole (ABILIFY) 5 MG tablet Take 1 tablet (5 mg total) by mouth daily.  . [DISCONTINUED] benazepril (LOTENSIN) 20 MG tablet Take 1 tablet (20 mg total) by mouth daily. HOLD for next 2 days  . [DISCONTINUED] FLUoxetine (PROZAC) 40 MG capsule Take 1 capsule (40 mg total) by mouth daily.  . [DISCONTINUED] oxyCODONE (ROXICODONE) 15 MG immediate release tablet Take 1 tablet (15 mg total) by mouth every 4 (four) hours as needed.  . [DISCONTINUED] traZODone (DESYREL) 100 MG tablet Take 1 tablet (100 mg total) by mouth at bedtime.  [name] oxymorphone (OPANA) 10  MG tablet   . [DISCONTINUED] oxyCODONE (ROXICODONE) 15 MG immediate release tablet Take one tablet by mouth every 3 hours as needed for moderate pain; Take two tablets by mouth every 3 hours as needed for severe pain    Physical Exam: Constitutional:  BP 96/60 mmHg  Pulse 66  Ht 6' 1"  (1.854 m)  Wt 236 lb 3.2 oz (107.14 kg)  BMI 31.17 kg/m2 Recent Results (from the past 2160 hour(s))  Hemoglobin A1c     Status: None   Collection Time: 07/14/14  3:25 PM  Result Value Ref Range   Hgb A1c MFr Bld 5.6 4.6 - 6.5 %    Comment: Glycemic Control Guidelines for People with Diabetes:Non Diabetic:  <6%Goal of Therapy: <7%Additional Action Suggested:  >8%   Lipid panel     Status: Abnormal   Collection Time: 07/14/14  3:25 PM  Result Value Ref Range   Cholesterol 108 0 - 200 mg/dL    Comment: ATP III Classification       Desirable:  < 200 mg/dL  Borderline High:  200 - 239 mg/dL          High:  > = 240 mg/dL   Triglycerides 75.0 0.0 - 149.0 mg/dL    Comment: Normal:  <150 mg/dLBorderline High:  150 - 199 mg/dL   HDL 36.30 (L) >39.00 mg/dL   VLDL 15.0 0.0 - 40.0 mg/dL   LDL Cholesterol 57 0 - 99 mg/dL   Total CHOL/HDL Ratio 3     Comment:                Men          Women1/2 Average Risk     3.4          3.3Average Risk          5.0          4.42X Average Risk          9.6          7.13X Average Risk          15.0          11.0                       NonHDL 71.70     Comment: NOTE:  Non-HDL goal should be 30 mg/dL higher than patient's LDL goal (i.e. LDL goal of < 70 mg/dL, would have non-HDL goal of < 100 mg/dL)  Basic metabolic panel     Status: Abnormal   Collection Time: 07/14/14  3:25 PM  Result Value Ref Range   Sodium 141 135 - 145 mEq/L   Potassium 4.2 3.5 - 5.1 mEq/L   Chloride 105 96 - 112 mEq/L   CO2 31 19 - 32 mEq/L   Glucose, Bld 104 (H) 70 - 99 mg/dL   BUN 26 (H) 6 - 23 mg/dL   Creatinine, Ser 0.89 0.40 - 1.50 mg/dL   Calcium 9.3 8.4 - 10.5 mg/dL   GFR 92.02  >60.00 mL/min  Hepatic function panel     Status: None   Collection Time: 07/14/14  3:25 PM  Result Value Ref Range   Total Bilirubin 0.3 0.2 - 1.2 mg/dL   Bilirubin, Direct 0.1 0.0 - 0.3 mg/dL   Alkaline Phosphatase 48 39 - 117 U/L   AST 14 0 - 37 U/L   ALT 8 0 - 53 U/L   Total Protein 7.0 6.0 - 8.3 g/dL   Albumin 4.1 3.5 - 5.2 g/dL  CBC with Differential/Platelet     Status: Abnormal   Collection Time: 07/14/14  3:25 PM  Result Value Ref Range   WBC 4.7 4.0 - 10.5 K/uL   RBC 3.70 (L) 4.22 - 5.81 Mil/uL   Hemoglobin 11.1 (L) 13.0 - 17.0 g/dL   HCT 33.0 (L) 39.0 - 52.0 %   MCV 89.2 78.0 - 100.0 fl   MCHC 33.5 30.0 - 36.0 g/dL   RDW 15.4 11.5 - 15.5 %   Platelets 224.0 150.0 - 400.0 K/uL   Neutrophils Relative % 58.7 43.0 - 77.0 %   Lymphocytes Relative 29.1 12.0 - 46.0 %   Monocytes Relative 8.1 3.0 - 12.0 %   Eosinophils Relative 3.9 0.0 - 5.0 %   Basophils Relative 0.2 0.0 - 3.0 %   Neutro Abs 2.7 1.4 - 7.7 K/uL   Lymphs Abs 1.4 0.7 - 4.0 K/uL   Monocytes Absolute 0.4 0.1 - 1.0 K/uL   Eosinophils Absolute 0.2 0.0 - 0.7 K/uL   Basophils  Absolute 0.0 0.0 - 0.1 K/uL  TSH     Status: None   Collection Time: 07/14/14  3:25 PM  Result Value Ref Range   TSH 0.91 0.35 - 4.50 uIU/mL  Urinalysis, Routine w reflex microscopic     Status: None   Collection Time: 07/14/14  3:25 PM  Result Value Ref Range   Color, Urine YELLOW Yellow;Lt. Yellow   APPearance CLEAR Clear   Specific Gravity, Urine 1.015 1.000-1.030   pH 6.0 5.0 - 8.0   Total Protein, Urine NEGATIVE Negative   Urine Glucose NEGATIVE Negative   Ketones, ur NEGATIVE Negative   Bilirubin Urine NEGATIVE Negative   Hgb urine dipstick NEGATIVE Negative   Urobilinogen, UA 0.2 0.0 - 1.0   Leukocytes, UA NEGATIVE Negative   Nitrite NEGATIVE Negative   WBC, UA 0-2/hpf 0-2/hpf   RBC / HPF none seen 0-2/hpf   Squamous Epithelial / LPF Rare(0-4/hpf) Rare(0-4/hpf)  Urine culture     Status: None   Collection Time: 07/14/14   3:25 PM  Result Value Ref Range   Colony Count NO GROWTH    Organism ID, Bacteria NO GROWTH   Valproic Acid level     Status: Abnormal   Collection Time: 07/14/14  3:25 PM  Result Value Ref Range   Valproic Acid Lvl 46.3 (L) 50.0 - 100.0 ug/mL     Musculoskeletal: Strength & Muscle Tone: decreased Gait & Station: Difficulty walking due to pain Patient leans: Front  Mental Status Examination;  Patient is casually dressed and fairly groomed.  He is using cane to help walking .  He maintains fair eye contact.  His speech is fast but coherent.  He described his mood neutral and his affect is mood appropriate.  He denies any suicidal thoughts or homicidal thoughts.  He denies any auditory hallucination or visual hallucination.  There were no paranoia , delusions or any of the session.  He denies any tremors or shakes.  His fund of knowledge is adequate.  He is alert and oriented x3.  His insight judgment and impulse control is fair.  Established Problem, Stable/Improving (1), Review of Psycho-Social Stressors (1), Review or order clinical lab tests (1), Review and summation of old records (2), Review of Last Therapy Session (1) and Review of Medication Regimen & Side Effects (2)  Assessment: Axis I: Mood disorder NOS, rule out bipolar disorder depressed type  Axis II: Deferred  Axis III:  Past Medical History  Diagnosis Date  . Impaired glucose tolerance 09/27/2010  . HYPERLIPIDEMIA-MIXED 07/15/2009  . Chronic pain syndrome 10/27/2009  . HYPERTENSION 06/24/2009  . CORONARY ARTERY DISEASE 06/24/2009    a. s/p multiple PCIs - In 2008 he had a Taxus DES to the mild LAD, Endeavor DES to mid LCX and distal LCX. In January 2009 he had DES to distal LCX, mid LCX and proximal LCX. In November 2009 had BMS x 2 to the mid RCA. Cath 10/2011 with patent stents, noncardiac CP. LHC 01/2013: patent stents (noncardiac CP).  [name] URETHRAL STRICTURE 06/24/2009  . SHOULDER PAIN, BILATERAL 06/24/2009  . ANKLE PAIN, RIGHT  06/24/2009  . SCIATICA, LEFT 04/19/2010  . NEPHROLITHIASIS, HX OF 06/24/2009  . ERECTILE DYSFUNCTION, ORGANIC 05/30/2010  . Obesity   . ALLERGIC RHINITIS 06/24/2009  . Anxiety   . Depression     Prior suicide attempt  . Irregular heart beat   . Pneumonia   . Self-catheterizes urinary bladder   . Anginal pain   . Type II diabetes mellitus   .  Hepatitis C dx'd 1979  . DEGENERATIVE JOINT DISEASE 06/24/2009  . [place] DISEASE, LUMBAR 04/19/2010  . Arthritis     "all my joints" (09/30/2013)  . Fibromyalgia     "left leg" (09/30/2013)  . Chronic back pain     "my whole back" (09/30/2013)  . Kidney stones     "passed on my own"  . Myocardial infarction 2008  . OSA (obstructive sleep apnea)     not using CPAP (09/30/2013)  . Heart murmur    Plan:  Patient is stable on his current psychotropic medication.  I review his blood work, collateral information from his primary care physician and his current medication.  His hemoglobin A1c is normal however he do not have any Depakote level.  I recommended to contact his primary care physician but Depakote level and patient promised that he will discuss with his physician on his next appointment.  I will continue Prozac 40 mg daily, trazodone 100 mg at bedtime and Abilify 5 mg daily.  He does not have any tremors or shakes. His Neurontin , Klonopin, Depakote as given by his primary care physician .  He is taking Neurontin 800 mg 4 times a day, Depakote 250 mg in the morning and 500 mg at bedtime and Klonopin 2 mg at bedtime.  I suggested to cut down the Klonopin half tablet and take only as needed.  Discussed medication side effects and benefits. Reinforce to see Dr. Cheryln Manly for counseling. Recommended to call us back if he has any question or any concern.  I will see him again in 3 months. Time spent 25 minutes.  More than 50% of the time spent in psychoeducation, counseling and coordination of care.  Discuss safety plan that anytime having active suicidal thoughts  or homicidal thoughts then patient need to call 911 or go to the local emergency room.  Bevan Disney T., MD 07/29/2014

## 2014-07-30 ENCOUNTER — Ambulatory Visit (HOSPITAL_COMMUNITY): Payer: Self-pay | Admitting: Psychiatry

## 2014-08-04 ENCOUNTER — Ambulatory Visit: Payer: Commercial Managed Care - HMO | Attending: Specialist

## 2014-08-04 DIAGNOSIS — R269 Unspecified abnormalities of gait and mobility: Secondary | ICD-10-CM

## 2014-08-04 DIAGNOSIS — R262 Difficulty in walking, not elsewhere classified: Secondary | ICD-10-CM | POA: Insufficient documentation

## 2014-08-04 DIAGNOSIS — R293 Abnormal posture: Secondary | ICD-10-CM | POA: Insufficient documentation

## 2014-08-04 DIAGNOSIS — M4806 Spinal stenosis, lumbar region: Secondary | ICD-10-CM | POA: Insufficient documentation

## 2014-08-04 DIAGNOSIS — M6281 Muscle weakness (generalized): Secondary | ICD-10-CM

## 2014-08-04 NOTE — Therapy (Signed)
[facility] [address] [place], [place], [address] Phone: [phone]   Fax:  [phone]  Physical Therapy Evaluation  Patient Details  Name: Travis Bixler Sr. MRN: [id number] Date of Birth: [birth year] Referring Provider:  [name], MD  Encounter Date: 08/04/2014      PT End of Session - 08/04/14 0957    Visit Number 1   Number of Visits 17   Date for PT Re-Evaluation 10/03/14   Authorization Type [employer], g-code every 10th visit.   PT Start Time 0848   PT Stop Time 0935   PT Time Calculation (min) 47 min   Equipment Utilized During Treatment Gait belt   Activity Tolerance Patient tolerated treatment well   Behavior During Therapy WFL for tasks assessed/performed      Past Medical History  Diagnosis Date  . Impaired glucose tolerance 09/27/2010  . HYPERLIPIDEMIA-MIXED 07/15/2009  . Chronic pain syndrome 10/27/2009  . HYPERTENSION 06/24/2009  . CORONARY ARTERY DISEASE 06/24/2009    a. s/p multiple PCIs - In 2008 he had a Taxus DES to the mild LAD, Endeavor DES to mid LCX and distal LCX. In January 2009 he had DES to distal LCX, mid LCX and proximal LCX. In November 2009 had BMS x 2 to the mid RCA. Cath 10/2011 with patent stents, noncardiac CP. LHC 01/2013: patent stents (noncardiac CP).  [name] URETHRAL STRICTURE 06/24/2009  . SHOULDER PAIN, BILATERAL 06/24/2009  . ANKLE PAIN, RIGHT 06/24/2009  . SCIATICA, LEFT 04/19/2010  . NEPHROLITHIASIS, HX OF 06/24/2009  . ERECTILE DYSFUNCTION, ORGANIC 05/30/2010  . Obesity   . ALLERGIC RHINITIS 06/24/2009  . Anxiety   . Depression     Prior suicide attempt  . Irregular heart beat   . Pneumonia   . Self-catheterizes urinary bladder   . Anginal pain   . Type II diabetes mellitus   . Hepatitis C dx'd 1979  . DEGENERATIVE JOINT DISEASE 06/24/2009  . [place] DISEASE, LUMBAR 04/19/2010  . Arthritis     "all my joints" (09/30/2013)  . Fibromyalgia     "left leg" (09/30/2013)  . Chronic  back pain     "my whole back" (09/30/2013)  . Kidney stones     "passed on my own"  . Myocardial infarction 2008  . OSA (obstructive sleep apnea)     not using CPAP (09/30/2013)  . Heart murmur     Past Surgical History  Procedure Laterality Date  . Shoulder open rotator cuff repair Right X 2  . Total knee arthroplasty Bilateral 2008  . Carpal tunnel release Bilateral   . Urethral dilation  X 4  . Knee arthroscopy Right X 7  . Umbilical hernia repair      UHR  . Hernia repair      umbilical  . Shoulder arthroscopy w/ rotator cuff repair Bilateral     "3on the left; 2 on the right"  . Tonsillectomy    . Knee cartilage surgery Right X 4    "open; not scopes"  . Knee cartilage surgery Left X 3    "open; not scopes"  . Coronary angioplasty with stent placement      "I have 9 stents"  . Cardiac catheterization  X 1  . Anterior cervical decomp/discectomy fusion N/A 09/26/2013    Procedure: ANTERIOR CERVICAL DISCECTOMY FUSION C3-4, plate and screw fixation, allograft bone graft;  Surgeon: Jessy Oto, MD;  Location: [facility];  Service: Orthopedics;  Laterality: N/A;  . Laminectomy  01/28/2014    L3 L4 L5  . Lumbar laminectomy/decompression microdiscectomy N/A 01/27/2014    Procedure: CENTRAL LUMBAR LAMINECTOMY L4-5 AND L3-4;  Surgeon: Jessy Oto, MD;  Location: [facility];  Service: Orthopedics;  Laterality: N/A;  . Joint replacement Bilateral     knees  . Colonoscopy    . Vasectomy    . Fusion of talonavicular joint Right 04/15/2014    dr duda  . Ankle fusion Right 04/15/2014    Procedure: Right Subtalar, Talonavicular Fusion;  Surgeon: Newt Minion, MD;  Location: [facility];  Service: Orthopedics;  Laterality: Right;  . Left heart catheterization with coronary angiogram N/A 02/10/2013    Procedure: LEFT HEART CATHETERIZATION WITH CORONARY ANGIOGRAM;  Surgeon: Burnell Blanks, MD;  Location: [facility] CATH LAB;  Service: Cardiovascular;  Laterality: N/A;    There were no vitals filed  for this visit.  Visit Diagnosis:  Abnormality of gait - Plan: PT plan of care cert/re-cert  Muscle weakness - Plan: PT plan of care cert/re-cert      Subjective Assessment - 08/04/14 0857    Symptoms impaired balance, weakness, and difficulty walking all started 3 months prior to cervical surgery 9 months    Pertinent History 3 Cervical and lumbar surgeries over last 9 months, R ankle fusion 03/2014, diabetes, hx of MI and 9 heart stents, R rotator cuff surgery 4 years ago with limited R shoulder AROM   Patient Stated Goals Be able to walk without a cane, get back to walking 6-7 miles per day   Currently in Pain? No/denies  none at rest but increased R ankle pain during amb. (10/10 at worst)            [facility] PT Assessment - 08/04/14 0901    Assessment   Medical Diagnosis Recurrent falls and generalized weakness   Onset Date 05/22/13   Prior Therapy SNF s/p Cx and Lx surgeries, Pt had OPPT for back pain prior to R ankle fusion but ceased in 03/2014.   Precautions   Precautions Fall   Precaution Comments Pt reported he no longer had back/neck precautions and no ankle weight bearing or ROM precautions.   Restrictions   Weight Bearing Restrictions No   Balance Screen   Has the patient fallen in the past 6 months Yes   How many times? 10   Has the patient had a decrease in activity level because of a fear of falling?  Yes   Is the patient reluctant to leave their home because of a fear of falling?  Yes   Home Environment   Living Enviornment Private residence   Living Arrangements Other relatives  Brother and sister-in-law   Available Help at Discharge Family   Type of [place] to enter   Entrance Stairs-Number of Steps 5   Entrance Stairs-Rails Can reach both   [place] - single point;Walker - 2 wheels;Bedside commode   Prior Function   Level of Independence Independent with basic ADLs;Independent with  homemaking with ambulation;Independent with gait;Independent with transfers   [employer], coach middle school sports (track, baseball, wrestling), walk 6-7 miles/day   Cognition   Overall Cognitive Status Within Functional Limits for tasks assessed  However, pt reports difficulty with memory   Observation/Other Assessments   Focus on Therapeutic Outcomes (FOTO)  FOTO Physical FS score 37   Sensation   Light Touch Impaired  by gross assessment  Decr. R hand and R LE light touch sensation   Additional Comments R thumb and index finger N/T and B leg cramps but no N/T   Coordination   Gross Motor Movements are Fluid and Coordinated Yes   Fine Motor Movements are Fluid and Coordinated Yes   Posture/Postural Control   Posture/Postural Control Postural limitations   Postural Limitations Rounded Shoulders;Forward head;Decreased lumbar lordosis;Weight shift left   ROM / Strength   AROM / PROM / Strength AROM;Strength   AROM   Overall AROM  Deficits   Overall AROM Comments WFL except for decreased R shoulder flexion and R ankle ROM due to fusion.   Strength   Overall Strength Deficits   Overall Strength Comments B UE WFL B hip flexion 3+/5, B knee flexion 4/5, B knee ext 4/5 and L dorsiflexion 4/5, R dorsiflexion not tested due to R ankle pain with movement (pt able to obtain neutral dorsifleixion). B hip abductor weakness suspected due to trendelenberg gait   Transfers   Transfers Sit to Stand;Stand to Sit   Sit to Stand 5: Supervision;With upper extremity assist;With armrests;From chair/3-in-1   Sit to Stand Details (indicate cue type and reason) noted to experience post. trunk lean during sit to stand   Stand to Sit 5: Supervision;With upper extremity assist;With armrests;To chair/3-in-1   Ambulation/Gait   Ambulation/Gait Yes   Ambulation/Gait Assistance 4: Min guard;4: Min assist  min A during 1 LOB    Ambulation/Gait Assistance Details Pt ambulated  over even terrain.   Ambulation Distance (Feet) 75 Feet   Assistive device Straight cane   Gait Pattern Step-through pattern;Decreased stride length;Trendelenburg;Lateral trunk lean to right  R foot ER, R shoulder shrug   Ambulation Surface Level;Indoor   Gait velocity 1.64f/sec.  with SPC   Standardized Balance Assessment   Standardized Balance Assessment Berg Balance Test;Timed Up and Go Test   Berg Balance Test   Sit to Stand Able to stand  independently using hands   Standing Unsupported Able to stand 2 minutes with supervision   Sitting with Back Unsupported but Feet Supported on Floor or Stool Able to sit safely and securely 2 minutes   Stand to Sit Controls descent by using hands   Transfers Able to transfer safely, definite need of hands   Standing Unsupported with Eyes Closed Able to stand 10 seconds with supervision   Standing Ubsupported with Feet Together Able to place feet together independently and stand for 1 minute with supervision   From Standing, Reach Forward with Outstretched Arm Can reach forward >12 cm safely (5")  6"   From Standing Position, Pick up Object from Floor Able to pick up shoe, needs supervision   From Standing Position, Turn to Look Behind Over each Shoulder Looks behind one side only/other side shows less weight shift   Turn 360 Degrees Needs close supervision or verbal cueing   Standing Unsupported, Alternately Place Feet on Step/Stool Needs assistance to keep from falling or unable to try   Standing Unsupported, One Foot in Front Able to take small step independently and hold 30 seconds   Standing on One Leg Tries to lift leg/unable to hold 3 seconds but remains standing independently   Total Score 35   Timed Up and Go Test   TUG Normal TUG   Normal TUG (seconds) 23.12  with SPC  PT Short Term Goals - 08/04/14 1001    PT SHORT TERM GOAL #1   Title Pt will be independent in HEP to improve  functional mobilty. Target date 09/01/14.   Status New   PT SHORT TERM GOAL #2   Title Pt will improve BERG score to >/=39/56 to decrease falls risk. Target date 09/01/14.   Status New   PT SHORT TERM GOAL #3   Title Pt will report zero falls over the last 4 weeks to improve safety during functional mobililty. Target date 09/01/14.   Status New   PT SHORT TERM GOAL #4   Title Pt will ambulate 300' over even terrain with LRAD and supervision to improve functional mobility. Target date 09/01/14.   Status New           PT Long Term Goals - 08/04/14 1003    PT LONG TERM GOAL #1   Title Pt will verbalize understanding of fall prevention strategies to reduce falls risk. Target date 09/29/14.   Status New   PT LONG TERM GOAL #2   Title Pt will improve BERG score to 43/56 to decrease falls risk. Target date 09/29/14.   Status New   PT LONG TERM GOAL #3   Title Pt will improve FOTO score by 10 points to improve quality of life. Target date 09/29/14.   Status New   PT LONG TERM GOAL #4   Title Pt will perform TUG in </=13.5 seconds with LRAD to decrease falls risk. Target date 09/29/14.   Status New   PT LONG TERM GOAL #5   Title Pt will improve gait speed with LRAD to >/=1.8 ft/sec. to ambulate without risk for recurrent falls. Target date 09/29/14.   Status New   Additional Long Term Goals   Additional Long Term Goals Yes   PT LONG TERM GOAL #6   Title Pt will ambulate 600' with LRAD over even/uneven terrain at MOD I level to improve functional mobility. Target date 09/29/14.   Status New               Plan - 08/04/14 0858    Clinical Impression Statement Pt is a 64 y/o male presenting to OPPT neuro with impaired balance and recurrent falls. Pt reported he has experienced 10 fall in the last 6 months, during ambulation. Pt reported his balance was off prior to the 3 neck/lumbar surgeries. Pt reported surgeries began 9 months ago, and reported balance is still "off" after the surgeries.  Pt also had R ankle fusion 03/2014. Pt with history of L sciatic pain, PT will not directly address back/ankle pain but will continue to monitor.  Pt reported he feels woozy and lightheaded prior to falling and when he changes positions, such as sit to stand.  Pt is at a high falls risk as indicated by his BERG balance score (35/56) and TUG time (23.12 sec.).    Pt will benefit from skilled therapeutic intervention in order to improve on the following deficits Abnormal gait;Decreased endurance;Obesity;Decreased knowledge of use of DME;Decreased balance;Decreased strength;Decreased mobility;Pain   Rehab Potential Good   PT Frequency 2x / week   PT Duration 8 weeks   PT Treatment/Interventions ADLs/Self Care Home Management;Gait training;Neuromuscular re-education;Stair training;Ultrasound;Biofeedback;Functional mobility training;Patient/family education;Cryotherapy;Therapeutic activities;Electrical Stimulation;Therapeutic exercise;Manual techniques;DME Instruction;Balance training   PT Next Visit Plan Initiate balance/strength HEP, assess for orthostatic hypotension, trial gait with RW, set up walking program   Consulted and Agree with Plan of Care Patient  G-Codes - 08/04/14 1007    Functional Assessment Tool Used BERG 35/56; gait speed 1.40f/sec with SPC, TUG with SPC 23.12sec.   Functional Limitation Mobility: Walking and moving around   Mobility: Walking and Moving Around Current Status ([phone] At least 60 percent but less than 80 percent impaired, limited or restricted   Mobility: Walking and Moving Around Goal Status [phone] At least 20 percent but less than 40 percent impaired, limited or restricted       Problem List Patient Active Problem List   Diagnosis Date Noted  . General weakness 07/14/2014  . Urinary incontinence 07/14/2014  . Diabetes mellitus 05/06/2014  . Arthritis of foot, right, degenerative 04/15/2014  . Postoperative anemia due to acute blood loss 01/30/2014     Class: Acute  . Hypotension, unspecified 01/30/2014    Class: Acute  . Spinal stenosis, lumbar 01/27/2014  . Cellulitis of leg, right 10/15/2013  . Headache(784.0) 10/15/2013  . Type 1 diabetes, uncontrolled, with peripheral circulatory disorder 10/04/2013  . Acute posthemorrhagic anemia 10/04/2013  . Acute urinary retention 09/30/2013    Class: Acute  . Spinal stenosis in cervical region 09/26/2013    Class: Chronic  . Spinal stenosis, lumbar region, with neurogenic claudication 09/26/2013    Class: Chronic  . Cervical stenosis of spinal canal 09/26/2013  . Neck pain 09/02/2013  . Hand joint pain 06/10/2013  . Rotator cuff tear arthropathy of right shoulder 06/10/2013  . Skin lesion of cheek 05/01/2013  . Pain of right thumb 04/03/2013  . Balance disorder 03/12/2013  . Gait disorder 03/12/2013  . Tremor 03/12/2013  . Left hip pain 03/12/2013  . Low back pain 03/12/2013  . Pre-ulcerative corn or callous 02/06/2013  . Polypharmacy 06/25/2012    Class: Chronic  . Balance problems 12/25/2011  . Anxiety 11/12/2011  . OSA (obstructive sleep apnea) 11/07/2011  . Bradycardia 10/20/2011  . Insomnia 10/04/2011  . Lumbar degenerative disc disease 07/11/2011  . Obesity 01/12/2011  . Preventative health care 09/27/2010  . ERECTILE DYSFUNCTION, ORGANIC 05/30/2010  . DCayucosDISEASE, LUMBAR 04/19/2010  . SCIATICA, LEFT 04/19/2010  . Chronic pain syndrome 10/27/2009  . Hyperlipidemia 07/15/2009  . Depression 06/24/2009  . Essential hypertension 06/24/2009  . CAD (coronary artery disease) 06/24/2009  . Allergic rhinitis 06/24/2009  . URETHRAL STRICTURE 06/24/2009  . DEGENERATIVE JOINT DISEASE 06/24/2009  . SHOULDER PAIN, BILATERAL 06/24/2009  . FATIGUE 06/24/2009  . NEPHROLITHIASIS, HX OF 06/24/2009    Cassia Fein L 08/04/2014, 10:10 AM  [facility] [address]Phone: [phone]   Fax:  [phone]    JGeoffry Paradise PT,DPT 08/04/2014 10:10 AM Phone: [phone]: [phone]

## 2014-08-11 ENCOUNTER — Ambulatory Visit: Payer: Commercial Managed Care - HMO

## 2014-08-18 ENCOUNTER — Other Ambulatory Visit: Payer: Self-pay | Admitting: Internal Medicine

## 2014-08-18 ENCOUNTER — Ambulatory Visit: Payer: Commercial Managed Care - HMO

## 2014-08-18 VITALS — BP 83/49 | HR 50

## 2014-08-18 DIAGNOSIS — I951 Orthostatic hypotension: Secondary | ICD-10-CM

## 2014-08-18 DIAGNOSIS — R001 Bradycardia, unspecified: Secondary | ICD-10-CM

## 2014-08-18 DIAGNOSIS — R269 Unspecified abnormalities of gait and mobility: Secondary | ICD-10-CM

## 2014-08-18 DIAGNOSIS — R293 Abnormal posture: Secondary | ICD-10-CM | POA: Diagnosis not present

## 2014-08-18 DIAGNOSIS — M6281 Muscle weakness (generalized): Secondary | ICD-10-CM

## 2014-08-18 DIAGNOSIS — R262 Difficulty in walking, not elsewhere classified: Secondary | ICD-10-CM | POA: Diagnosis not present

## 2014-08-18 DIAGNOSIS — M4806 Spinal stenosis, lumbar region: Secondary | ICD-10-CM | POA: Diagnosis not present

## 2014-08-18 NOTE — Therapy (Signed)
[facility] [address] [place], [place], [address] Phone: [phone]   Fax:  [phone]  Physical Therapy Treatment  Patient Details  Name: Travis Foister Sr. MRN: [id number] Date of Birth: 06/26/1950 Referring Provider:  [name], MD  Encounter Date: 08/18/2014      PT End of Session - 08/18/14 1429    Visit Number 2   Number of Visits 17   Date for PT Re-Evaluation 10/03/14   Authorization Type [employer], g-code every 10th visit.   PT Start Time 0802   PT Stop Time [phone]   PT Time Calculation (min) 41 min   Equipment Utilized During Treatment Gait belt   Activity Tolerance Patient tolerated treatment well   Behavior During Therapy WFL for tasks assessed/performed      Past Medical History  Diagnosis Date  . Impaired glucose tolerance 09/27/2010  . HYPERLIPIDEMIA-MIXED 07/15/2009  . Chronic pain syndrome 10/27/2009  . HYPERTENSION 06/24/2009  . CORONARY ARTERY DISEASE 06/24/2009    a. s/p multiple PCIs - In 2008 he had a Taxus DES to the mild LAD, Endeavor DES to mid LCX and distal LCX. In January 2009 he had DES to distal LCX, mid LCX and proximal LCX. In November 2009 had BMS x 2 to the mid RCA. Cath 10/2011 with patent stents, noncardiac CP. LHC 01/2013: patent stents (noncardiac CP).  [name] URETHRAL STRICTURE 06/24/2009  . SHOULDER PAIN, BILATERAL 06/24/2009  . ANKLE PAIN, RIGHT 06/24/2009  . SCIATICA, LEFT 04/19/2010  . NEPHROLITHIASIS, HX OF 06/24/2009  . ERECTILE DYSFUNCTION, ORGANIC 05/30/2010  . Obesity   . ALLERGIC RHINITIS 06/24/2009  . Anxiety   . Depression     Prior suicide attempt  . Irregular heart beat   . Pneumonia   . Self-catheterizes urinary bladder   . Anginal pain   . Type II diabetes mellitus   . Hepatitis C dx'd 1979  . DEGENERATIVE JOINT DISEASE 06/24/2009  . [place] DISEASE, LUMBAR 04/19/2010  . Arthritis     "all my joints" (09/30/2013)  . Fibromyalgia     "left leg" (09/30/2013)  . Chronic back  pain     "my whole back" (09/30/2013)  . Kidney stones     "passed on my own"  . Myocardial infarction 2008  . OSA (obstructive sleep apnea)     not using CPAP (09/30/2013)  . Heart murmur     Past Surgical History  Procedure Laterality Date  . Shoulder open rotator cuff repair Right X 2  . Total knee arthroplasty Bilateral 2008  . Carpal tunnel release Bilateral   . Urethral dilation  X 4  . Knee arthroscopy Right X 7  . Umbilical hernia repair      UHR  . Hernia repair      umbilical  . Shoulder arthroscopy w/ rotator cuff repair Bilateral     "3on the left; 2 on the right"  . Tonsillectomy    . Knee cartilage surgery Right X 4    "open; not scopes"  . Knee cartilage surgery Left X 3    "open; not scopes"  . Coronary angioplasty with stent placement      "I have 9 stents"  . Cardiac catheterization  X 1  . Anterior cervical decomp/discectomy fusion N/A 09/26/2013    Procedure: ANTERIOR CERVICAL DISCECTOMY FUSION C3-4, plate and screw fixation, allograft bone graft;  Surgeon: Jessy Oto, MD;  Location: [facility];  Service: Orthopedics;  Laterality: N/A;  . Laminectomy  01/28/2014    L3 L4 L5  . Lumbar laminectomy/decompression microdiscectomy N/A 01/27/2014    Procedure: CENTRAL LUMBAR LAMINECTOMY L4-5 AND L3-4;  Surgeon: Jessy Oto, MD;  Location: [facility];  Service: Orthopedics;  Laterality: N/A;  . Joint replacement Bilateral     knees  . Colonoscopy    . Vasectomy    . Fusion of talonavicular joint Right 04/15/2014    dr duda  . Ankle fusion Right 04/15/2014    Procedure: Right Subtalar, Talonavicular Fusion;  Surgeon: Newt Minion, MD;  Location: [facility];  Service: Orthopedics;  Laterality: Right;  . Left heart catheterization with coronary angiogram N/A 02/10/2013    Procedure: LEFT HEART CATHETERIZATION WITH CORONARY ANGIOGRAM;  Surgeon: Burnell Blanks, MD;  Location: [facility] CATH LAB;  Service: Cardiovascular;  Laterality: N/A;    Filed Vitals:   08/18/14 0805  08/18/14 0813 08/18/14 0818  BP: 108/64 90/50 83/49   Pulse: 42 45 50  Systolic change >58IFOY and diastolic change >77AJOI during orthostatic hypotension assessment.   Visit Diagnosis:  Abnormality of gait  Muscle weakness      Subjective Assessment - 08/18/14 0805    Symptoms Pt denied falls or changes since last visit.    Pertinent History 3 Cervical and lumbar surgeries over last 9 months, R ankle fusion 03/2014, diabetes, hx of MI and 9 heart stents, R rotator cuff surgery 4 years ago with limited R shoulder AROM   Patient Stated Goals Be able to walk without a cane, get back to walking 6-7 miles per day   Currently in Pain? Yes   Pain Score 5    Pain Location Ankle   Pain Orientation Right   Pain Descriptors / Indicators Sore;Aching   Pain Type Chronic pain   Pain Onset 1 to 4 weeks ago   Pain Frequency Constant   Aggravating Factors  walking   Pain Relieving Factors medication   Multiple Pain Sites Yes   Pain Score 4   Pain Location Back   Pain Orientation Lower;Other (Comment)  center of low back   Pain Descriptors / Indicators Aching   Pain Onset More than a month ago   Pain Frequency Constant   Aggravating Factors  performing sit to stand   Pain Relieving Factors medication                       [facility] Adult PT Treatment/Exercise - 08/18/14 0820    Balance   Balance Assessed Yes   Static Standing Balance   Static Standing - Balance Support No upper extremity supported;Right upper extremity supported;Left upper extremity supported   Static Standing - Level of Assistance 5: Stand by assistance;Other (comment)  min guard   Static Standing - Comment/# of Minutes Performed in corner with chair in front of pt; B LEs, 2-3 sets with 10-30 second holds on non-compliant surface: feet apart/together with and without head turns, feet apart/together with eyes closed, modified tandem, single leg stance (with UE support). Pt experienced 4 LOB episodes and required  cues for technique.                PT Education - 08/18/14 1429    Education provided Yes   Education Details Balance HEP.   Person(s) Educated Patient   Methods Explanation;Demonstration;Tactile cues;Handout   Comprehension Verbalized understanding;Returned demonstration          PT Short Term Goals - 08/18/14 1433    PT SHORT TERM GOAL #1  Title Pt will be independent in HEP to improve functional mobilty. Target date 09/01/14.   Status On-going   PT SHORT TERM GOAL #2   Title Pt will improve BERG score to >/=39/56 to decrease falls risk. Target date 09/01/14.   Status On-going   PT SHORT TERM GOAL #3   Title Pt will report zero falls over the last 4 weeks to improve safety during functional mobililty. Target date 09/01/14.   Status On-going   PT SHORT TERM GOAL #4   Title Pt will ambulate 300' over even terrain with LRAD and supervision to improve functional mobility. Target date 09/01/14.   Status On-going           PT Long Term Goals - 08/18/14 1434    PT LONG TERM GOAL #1   Title Pt will verbalize understanding of fall prevention strategies to reduce falls risk. Target date 09/29/14.   Status On-going   PT LONG TERM GOAL #2   Title Pt will improve BERG score to 43/56 to decrease falls risk. Target date 09/29/14.   Status On-going   PT LONG TERM GOAL #3   Title Pt will improve FOTO score by 10 points to improve quality of life. Target date 09/29/14.   Status On-going   PT LONG TERM GOAL #4   Title Pt will perform TUG in </=13.5 seconds with LRAD to decrease falls risk. Target date 09/29/14.   Status On-going   PT LONG TERM GOAL #5   Title Pt will improve gait speed with LRAD to >/=1.8 ft/sec. to ambulate without risk for recurrent falls. Target date 09/29/14.   Status On-going   PT LONG TERM GOAL #6   Title Pt will ambulate 600' with LRAD over even/uneven terrain at MOD I level to improve functional mobility. Target date 09/29/14.   Status On-going                Plan - 08/18/14 1429    Clinical Impression Statement During assessment for orthostatic hypotension, pt experienced a drop in BP and lightheadedness when transferring from supine to sit and sit to stand. Pt's systolic and diastolic decreased and was positive for orthostatic hypotension. Please see vitals section for actual BP measurements. Pt's HR was low and he was not sure what his HR typically is. Pt experienced increased postural sway during balance activities, especially with head turns, eyes closed, and feet together. Continue with POC. PT will send note to MD regarding orthostatic hypotension.   Pt will benefit from skilled therapeutic intervention in order to improve on the following deficits Abnormal gait;Decreased endurance;Obesity;Decreased knowledge of use of DME;Decreased balance;Decreased strength;Decreased mobility;Pain   Rehab Potential Good   PT Frequency 2x / week   PT Duration 8 weeks   PT Treatment/Interventions ADLs/Self Care Home Management;Gait training;Neuromuscular re-education;Stair training;Ultrasound;Biofeedback;Functional mobility training;Patient/family education;Cryotherapy;Therapeutic activities;Electrical Stimulation;Therapeutic exercise;Manual techniques;DME Instruction;Balance training   PT Next Visit Plan Initiate strength HEP. trial gait with RW, set up walking program   Consulted and Agree with Plan of Care Patient        Problem List Patient Active Problem List   Diagnosis Date Noted  . General weakness 07/14/2014  . Urinary incontinence 07/14/2014  . Diabetes mellitus 05/06/2014  . Arthritis of foot, right, degenerative 04/15/2014  . Postoperative anemia due to acute blood loss 01/30/2014    Class: Acute  . Hypotension, unspecified 01/30/2014    Class: Acute  . Spinal stenosis, lumbar 01/27/2014  . Cellulitis of leg, right 10/15/2013  . Headache(784.0) 10/15/2013  .  Type 1 diabetes, uncontrolled, with peripheral circulatory  disorder 10/04/2013  . Acute posthemorrhagic anemia 10/04/2013  . Acute urinary retention 09/30/2013    Class: Acute  . Spinal stenosis in cervical region 09/26/2013    Class: Chronic  . Spinal stenosis, lumbar region, with neurogenic claudication 09/26/2013    Class: Chronic  . Cervical stenosis of spinal canal 09/26/2013  . Neck pain 09/02/2013  . Hand joint pain 06/10/2013  . Rotator cuff tear arthropathy of right shoulder 06/10/2013  . Skin lesion of cheek 05/01/2013  . Pain of right thumb 04/03/2013  . Balance disorder 03/12/2013  . Gait disorder 03/12/2013  . Tremor 03/12/2013  . Left hip pain 03/12/2013  . Low back pain 03/12/2013  . Pre-ulcerative corn or callous 02/06/2013  . Polypharmacy 06/25/2012    Class: Chronic  . Balance problems 12/25/2011  . Anxiety 11/12/2011  . OSA (obstructive sleep apnea) 11/07/2011  . Bradycardia 10/20/2011  . Insomnia 10/04/2011  . Lumbar degenerative disc disease 07/11/2011  . Obesity 01/12/2011  . Preventative health care 09/27/2010  . ERECTILE DYSFUNCTION, ORGANIC 05/30/2010  . [place] DISEASE, LUMBAR 04/19/2010  . SCIATICA, LEFT 04/19/2010  . Chronic pain syndrome 10/27/2009  . Hyperlipidemia 07/15/2009  . Depression 06/24/2009  . Essential hypertension 06/24/2009  . CAD (coronary artery disease) 06/24/2009  . Allergic rhinitis 06/24/2009  . URETHRAL STRICTURE 06/24/2009  . DEGENERATIVE JOINT DISEASE 06/24/2009  . SHOULDER PAIN, BILATERAL 06/24/2009  . FATIGUE 06/24/2009  . NEPHROLITHIASIS, HX OF 06/24/2009    Miller,Jennifer L 08/18/2014, 2:34 PM  [facility] [address] [place], [place], [address] Phone: [phone]   Fax:  [phone]     Geoffry Paradise, PT,DPT 08/18/2014 2:35 PM Phone: [phone] Fax: [phone]

## 2014-08-18 NOTE — Patient Instructions (Signed)
Perform all balance activities in a corner with a chair in front of you for safety:  Feet Apart, Head Motion - Eyes Open   With eyes open, feet apart, move head slowly: up and down and side to side for 30 seconds. Repeat __3__ times per session. Do __1__ sessions per day.  Copyright  VHI. All rights reserved.  Feet Apart, Varied Arm Positions - Eyes Closed   Stand with feet shoulder width apart and arms at your side. Close eyes and visualize upright position. Hold _10-30___ seconds. Repeat __3__ times per session. Do __1__ sessions per day.  Copyright  VHI. All rights reserved.  Feet Together, Varied Arm Positions - Eyes Open   With eyes open, feet together, arms at your side, look straight ahead at a stationary object. Hold _10-30___ seconds. Repeat __3__ times per session. Do __1__ sessions per day.  Copyright  VHI. All rights reserved.  Feet Partial Heel-Toe, Varied Arm Positions - Eyes Open   With eyes open, right foot partially in front of the other, arms at your side, look straight ahead at a stationary object.Repeat with left foot partially in front. Hold _30___ seconds. Repeat __3__ times per session. Do __1__ sessions per day.  Copyright  VHI. All rights reserved.  Single Leg - Eyes Open   Holding support, lift right leg while maintaining balance over other leg. Progress to removing hands from support surface for longer periods of time. Repeat with other leg lifted. Hold_10-30 ___ seconds. Repeat __3__ times per session. Do __1__ sessions per day.  Copyright  VHI. All rights reserved.

## 2014-08-19 ENCOUNTER — Ambulatory Visit: Payer: Commercial Managed Care - HMO

## 2014-08-19 DIAGNOSIS — M6281 Muscle weakness (generalized): Secondary | ICD-10-CM

## 2014-08-19 DIAGNOSIS — R293 Abnormal posture: Secondary | ICD-10-CM | POA: Diagnosis not present

## 2014-08-19 DIAGNOSIS — M4806 Spinal stenosis, lumbar region: Secondary | ICD-10-CM | POA: Diagnosis not present

## 2014-08-19 DIAGNOSIS — R269 Unspecified abnormalities of gait and mobility: Secondary | ICD-10-CM

## 2014-08-19 DIAGNOSIS — R262 Difficulty in walking, not elsewhere classified: Secondary | ICD-10-CM | POA: Diagnosis not present

## 2014-08-19 NOTE — Therapy (Signed)
[facility] [address] [place], [place], [address] Phone: [phone]   Fax:  [phone]  Physical Therapy Treatment  Patient Details  Name: Travis Fouche Sr. MRN: [id number] Date of Birth: 11-08-1950 Referring Provider:  [name], MD  Encounter Date: 08/19/2014      PT End of Session - 08/19/14 1437    Visit Number 3   Number of Visits 17   Date for PT Re-Evaluation 10/03/14   Authorization Type [employer], g-code every 10th visit.   PT Start Time [id number]   PT Stop Time 0929   PT Time Calculation (min) 42 min   Activity Tolerance Patient tolerated treatment well   Behavior During Therapy [facility] for tasks assessed/performed      Past Medical History  Diagnosis Date  . Impaired glucose tolerance 09/27/2010  . HYPERLIPIDEMIA-MIXED 07/15/2009  . Chronic pain syndrome 10/27/2009  . HYPERTENSION 06/24/2009  . CORONARY ARTERY DISEASE 06/24/2009    a. s/p multiple PCIs - In 2008 he had a Taxus DES to the mild LAD, Endeavor DES to mid LCX and distal LCX. In January 2009 he had DES to distal LCX, mid LCX and proximal LCX. In November 2009 had BMS x 2 to the mid RCA. Cath 10/2011 with patent stents, noncardiac CP. LHC 01/2013: patent stents (noncardiac CP).  [name] URETHRAL STRICTURE 06/24/2009  . SHOULDER PAIN, BILATERAL 06/24/2009  . ANKLE PAIN, RIGHT 06/24/2009  . SCIATICA, LEFT 04/19/2010  . NEPHROLITHIASIS, HX OF 06/24/2009  . ERECTILE DYSFUNCTION, ORGANIC 05/30/2010  . Obesity   . ALLERGIC RHINITIS 06/24/2009  . Anxiety   . Depression     Prior suicide attempt  . Irregular heart beat   . Pneumonia   . Self-catheterizes urinary bladder   . Anginal pain   . Type II diabetes mellitus   . Hepatitis C dx'd 1979  . DEGENERATIVE JOINT DISEASE 06/24/2009  . [place] DISEASE, LUMBAR 04/19/2010  . Arthritis     "all my joints" (09/30/2013)  . Fibromyalgia     "left leg" (09/30/2013)  . Chronic back pain     "my whole back" (09/30/2013)  . Kidney  stones     "passed on my own"  . Myocardial infarction 2008  . OSA (obstructive sleep apnea)     not using CPAP (09/30/2013)  . Heart murmur     Past Surgical History  Procedure Laterality Date  . Shoulder open rotator cuff repair Right X 2  . Total knee arthroplasty Bilateral 2008  . Carpal tunnel release Bilateral   . Urethral dilation  X 4  . Knee arthroscopy Right X 7  . Umbilical hernia repair      UHR  . Hernia repair      umbilical  . Shoulder arthroscopy w/ rotator cuff repair Bilateral     "3on the left; 2 on the right"  . Tonsillectomy    . Knee cartilage surgery Right X 4    "open; not scopes"  . Knee cartilage surgery Left X 3    "open; not scopes"  . Coronary angioplasty with stent placement      "I have 9 stents"  . Cardiac catheterization  X 1  . Anterior cervical decomp/discectomy fusion N/A 09/26/2013    Procedure: ANTERIOR CERVICAL DISCECTOMY FUSION C3-4, plate and screw fixation, allograft bone graft;  Surgeon: Jessy Oto, MD;  Location: [facility];  Service: Orthopedics;  Laterality: N/A;  . Laminectomy  01/28/2014    L3 L4 L5  .  Lumbar laminectomy/decompression microdiscectomy N/A 01/27/2014    Procedure: CENTRAL LUMBAR LAMINECTOMY L4-5 AND L3-4;  Surgeon: Jessy Oto, MD;  Location: [facility];  Service: Orthopedics;  Laterality: N/A;  . Joint replacement Bilateral     knees  . Colonoscopy    . Vasectomy    . Fusion of talonavicular joint Right 04/15/2014    dr duda  . Ankle fusion Right 04/15/2014    Procedure: Right Subtalar, Talonavicular Fusion;  Surgeon: Newt Minion, MD;  Location: [facility];  Service: Orthopedics;  Laterality: Right;  . Left heart catheterization with coronary angiogram N/A 02/10/2013    Procedure: LEFT HEART CATHETERIZATION WITH CORONARY ANGIOGRAM;  Surgeon: Burnell Blanks, MD;  Location: [facility] CATH LAB;  Service: Cardiovascular;  Laterality: N/A;    There were no vitals filed for this visit.  Visit Diagnosis:  Muscle  weakness  Abnormality of gait      Subjective Assessment - 08/19/14 0849    Symptoms Pt denied falls or changes since last visit.   Pertinent History 3 Cervical and lumbar surgeries over last 9 months, R ankle fusion 03/2014, diabetes, hx of MI and 9 heart stents, R rotator cuff surgery 4 years ago with limited R shoulder AROM   Patient Stated Goals Be able to walk without a cane, get back to walking 6-7 miles per day   Currently in Pain? Yes   Pain Score 5    Pain Location Ankle   Pain Orientation Right   Pain Descriptors / Indicators Aching;Sore   Pain Type Chronic pain   Pain Onset More than a month ago   Pain Frequency Constant   Aggravating Factors  walking   Pain Relieving Factors medication   Multiple Pain Sites Yes   Pain Score 4   Pain Location Back   Pain Orientation Lower   Pain Descriptors / Indicators Aching   Pain Type Chronic pain   Pain Onset More than a month ago   Pain Frequency Constant   Aggravating Factors  sit to stand   Pain Relieving Factors medication       Therex: -Seated B heel/toe raises x20. VC's for technique and to reduce speed.  -Sidelying B clamshells 3x10. VC's for technique and to keep hips forward. -Supine TrA activation with 5 second hold x10. VC's for technique. -Supine Bridge with TrA activation 3x10. VC's for technique. -Sit<>stand x10 without UE support. VC's for technique and to improve weight shifting.                       PT Education - 08/19/14 1437    Education provided Yes   Education Details Strengthening HEP   Person(s) Educated Patient   Methods Explanation;Demonstration;Tactile cues;Verbal cues;Handout   Comprehension Verbalized understanding;Returned demonstration          PT Short Term Goals - 08/18/14 1433    PT SHORT TERM GOAL #1   Title Pt will be independent in HEP to improve functional mobilty. Target date 09/01/14.   Status On-going   PT SHORT TERM GOAL #2   Title Pt will improve  BERG score to >/=39/56 to decrease falls risk. Target date 09/01/14.   Status On-going   PT SHORT TERM GOAL #3   Title Pt will report zero falls over the last 4 weeks to improve safety during functional mobililty. Target date 09/01/14.   Status On-going   PT SHORT TERM GOAL #4   Title Pt will ambulate 300' over even terrain with  LRAD and supervision to improve functional mobility. Target date 09/01/14.   Status On-going           PT Long Term Goals - 08/18/14 1434    PT LONG TERM GOAL #1   Title Pt will verbalize understanding of fall prevention strategies to reduce falls risk. Target date 09/29/14.   Status On-going   PT LONG TERM GOAL #2   Title Pt will improve BERG score to 43/56 to decrease falls risk. Target date 09/29/14.   Status On-going   PT LONG TERM GOAL #3   Title Pt will improve FOTO score by 10 points to improve quality of life. Target date 09/29/14.   Status On-going   PT LONG TERM GOAL #4   Title Pt will perform TUG in </=13.5 seconds with LRAD to decrease falls risk. Target date 09/29/14.   Status On-going   PT LONG TERM GOAL #5   Title Pt will improve gait speed with LRAD to >/=1.8 ft/sec. to ambulate without risk for recurrent falls. Target date 09/29/14.   Status On-going   PT LONG TERM GOAL #6   Title Pt will ambulate 600' with LRAD over even/uneven terrain at MOD I level to improve functional mobility. Target date 09/29/14.   Status On-going               Plan - 08/19/14 1437    Clinical Impression Statement Pt demonstrated progress, as he tolerated strengthening HEP well and required minimal cues. Pt required rest breaks due to fatigue and reported he still feels lightheadedness occasionally when changing positions. Conitnue with POC. MD sent message back to PT regarding BP and HR and stated MD is referring pt to cardiology.    Pt will benefit from skilled therapeutic intervention in order to improve on the following deficits Abnormal gait;Decreased  endurance;Obesity;Decreased knowledge of use of DME;Decreased balance;Decreased strength;Decreased mobility;Pain   Rehab Potential Good   PT Frequency 2x / week   PT Duration 8 weeks   PT Treatment/Interventions ADLs/Self Care Home Management;Gait training;Neuromuscular re-education;Stair training;Ultrasound;Biofeedback;Functional mobility training;Patient/family education;Cryotherapy;Therapeutic activities;Electrical Stimulation;Therapeutic exercise;Manual techniques;DME Instruction;Balance training   PT Next Visit Plan Trial gait with RW, set up walking program. Inform pt of MD referring to cardiology due to low BP and HR.   Consulted and Agree with Plan of Care Patient        Problem List Patient Active Problem List   Diagnosis Date Noted  . General weakness 07/14/2014  . Urinary incontinence 07/14/2014  . Diabetes mellitus 05/06/2014  . Arthritis of foot, right, degenerative 04/15/2014  . Postoperative anemia due to acute blood loss 01/30/2014    Class: Acute  . Hypotension, unspecified 01/30/2014    Class: Acute  . Spinal stenosis, lumbar 01/27/2014  . Cellulitis of leg, right 10/15/2013  . Headache(784.0) 10/15/2013  . Type 1 diabetes, uncontrolled, with peripheral circulatory disorder 10/04/2013  . Acute posthemorrhagic anemia 10/04/2013  . Acute urinary retention 09/30/2013    Class: Acute  . Spinal stenosis in cervical region 09/26/2013    Class: Chronic  . Spinal stenosis, lumbar region, with neurogenic claudication 09/26/2013    Class: Chronic  . Cervical stenosis of spinal canal 09/26/2013  . Neck pain 09/02/2013  . Hand joint pain 06/10/2013  . Rotator cuff tear arthropathy of right shoulder 06/10/2013  . Skin lesion of cheek 05/01/2013  . Pain of right thumb 04/03/2013  . Balance disorder 03/12/2013  . Gait disorder 03/12/2013  . Tremor 03/12/2013  . Left hip pain 03/12/2013  .  Low back pain 03/12/2013  . Pre-ulcerative corn or callous 02/06/2013  .  Polypharmacy 06/25/2012    Class: Chronic  . Balance problems 12/25/2011  . Anxiety 11/12/2011  . OSA (obstructive sleep apnea) 11/07/2011  . Bradycardia 10/20/2011  . Insomnia 10/04/2011  . Lumbar degenerative disc disease 07/11/2011  . Obesity 01/12/2011  . Preventative health care 09/27/2010  . ERECTILE DYSFUNCTION, ORGANIC 05/30/2010  . [place] DISEASE, LUMBAR 04/19/2010  . SCIATICA, LEFT 04/19/2010  . Chronic pain syndrome 10/27/2009  . Hyperlipidemia 07/15/2009  . Depression 06/24/2009  . Essential hypertension 06/24/2009  . CAD (coronary artery disease) 06/24/2009  . Allergic rhinitis 06/24/2009  . URETHRAL STRICTURE 06/24/2009  . DEGENERATIVE JOINT DISEASE 06/24/2009  . SHOULDER PAIN, BILATERAL 06/24/2009  . FATIGUE 06/24/2009  . NEPHROLITHIASIS, HX OF 06/24/2009    Lashanna Angelo L 08/19/2014, 2:40 PM  [facility] [address] [place], [place], [address] Phone: [phone]   Fax:  [phone]    Geoffry Paradise, PT,DPT 08/19/2014 2:40 PM Phone: [phone] Fax: [phone]

## 2014-08-19 NOTE — Patient Instructions (Addendum)
Ankle Bend (Dorsiflexion and Plantar Flexion)   Sitting or lying down, point toes up, keeping both heels on floor. Then press toes to floor, raising heels. Repeat _20___ times. Do __1-2__ sessions per day.  http://gt2.exer.us/403   Copyright  VHI. All rights reserved.   Abduction: Clam (Eccentric) - Side-Lying   Lie on side with knees bent. Lift top knee, keeping feet together. Keep trunk steady. Slowly lower knee. Repeat with other leg. _10__ reps per set, _3__ sets per day, _3-4__ days per week.  Copyright  VHI. All rights reserved.   Stabilization: Transverse Abdominus Contraction - Supine   Lie with knees bent, feet flat. Place fingers on abdominal muscles just inside hip bones. Contract abdominals, pulling naval toward spine. Feel muscle contract, but keep pelvis and back still and hold for 5 seconds. Repeat __10__ times per set. Do __1__ sets per session. Do _7___ sessions per week.  Copyright  VHI. All rights reserved.    Bridge   Lie back, legs bent. Tuck in stomach and then press hips up. Keeping ribs in, lengthen lower back. Then lower back down.   Repeat __10__ times. Do _3___ sessions per day. 3-4 times a week.  Copyright  VHI. All rights reserved.   Functional Quadriceps: Sit to Stand   Sit on edge of chair, feet flat on floor. Stand upright, extending knees fully. Repeat __10__ times per set. Do __1__ sets per session. Do _2___ sessions per day.  http://orth.exer.us/734   Copyright  VHI. All rights reserved.

## 2014-08-20 DIAGNOSIS — M12571 Traumatic arthropathy, right ankle and foot: Secondary | ICD-10-CM | POA: Diagnosis not present

## 2014-08-20 DIAGNOSIS — M19071 Primary osteoarthritis, right ankle and foot: Secondary | ICD-10-CM | POA: Diagnosis not present

## 2014-08-25 ENCOUNTER — Ambulatory Visit: Payer: Medicare PPO | Attending: Specialist

## 2014-08-25 DIAGNOSIS — R269 Unspecified abnormalities of gait and mobility: Secondary | ICD-10-CM

## 2014-08-25 DIAGNOSIS — R293 Abnormal posture: Secondary | ICD-10-CM | POA: Insufficient documentation

## 2014-08-25 DIAGNOSIS — M4806 Spinal stenosis, lumbar region: Secondary | ICD-10-CM | POA: Diagnosis present

## 2014-08-25 DIAGNOSIS — M6281 Muscle weakness (generalized): Secondary | ICD-10-CM

## 2014-08-25 DIAGNOSIS — R262 Difficulty in walking, not elsewhere classified: Secondary | ICD-10-CM | POA: Diagnosis not present

## 2014-08-25 NOTE — Therapy (Signed)
[facility] [address] [place], [place], [address] Phone: [phone]   Fax:  [phone]  Physical Therapy Treatment  Patient Details  Name: Travis Disano Sr. MRN: [id number] Date of Birth: [birth year] Referring Provider:  [name], MD  Encounter Date: 08/25/2014      PT End of Session - 08/25/14 1345    Visit Number 4   Number of Visits 17   Date for PT Re-Evaluation 10/03/14   Authorization Type [employer], g-code every 10th visit.   PT Start Time 0850   PT Stop Time 0928   PT Time Calculation (min) 38 min   Equipment Utilized During Treatment Gait belt   Activity Tolerance Patient tolerated treatment well   Behavior During Therapy WFL for tasks assessed/performed      Past Medical History  Diagnosis Date  . Impaired glucose tolerance 09/27/2010  . HYPERLIPIDEMIA-MIXED 07/15/2009  . Chronic pain syndrome 10/27/2009  . HYPERTENSION 06/24/2009  . CORONARY ARTERY DISEASE 06/24/2009    a. s/p multiple PCIs - In 2008 he had a Taxus DES to the mild LAD, Endeavor DES to mid LCX and distal LCX. In January 2009 he had DES to distal LCX, mid LCX and proximal LCX. In November 2009 had BMS x 2 to the mid RCA. Cath 10/2011 with patent stents, noncardiac CP. LHC 01/2013: patent stents (noncardiac CP).  [name] URETHRAL STRICTURE 06/24/2009  . SHOULDER PAIN, BILATERAL 06/24/2009  . ANKLE PAIN, RIGHT 06/24/2009  . SCIATICA, LEFT 04/19/2010  . NEPHROLITHIASIS, HX OF 06/24/2009  . ERECTILE DYSFUNCTION, ORGANIC 05/30/2010  . Obesity   . ALLERGIC RHINITIS 06/24/2009  . Anxiety   . Depression     Prior suicide attempt  . Irregular heart beat   . Pneumonia   . Self-catheterizes urinary bladder   . Anginal pain   . Type II diabetes mellitus   . Hepatitis C dx'd 1979  . DEGENERATIVE JOINT DISEASE 06/24/2009  . [place] DISEASE, LUMBAR 04/19/2010  . Arthritis     "all my joints" (09/30/2013)  . Fibromyalgia     "left leg" (09/30/2013)  . Chronic back  pain     "my whole back" (09/30/2013)  . Kidney stones     "passed on my own"  . Myocardial infarction 2008  . OSA (obstructive sleep apnea)     not using CPAP (09/30/2013)  . Heart murmur     Past Surgical History  Procedure Laterality Date  . Shoulder open rotator cuff repair Right X 2  . Total knee arthroplasty Bilateral 2008  . Carpal tunnel release Bilateral   . Urethral dilation  X 4  . Knee arthroscopy Right X 7  . Umbilical hernia repair      UHR  . Hernia repair      umbilical  . Shoulder arthroscopy w/ rotator cuff repair Bilateral     "3on the left; 2 on the right"  . Tonsillectomy    . Knee cartilage surgery Right X 4    "open; not scopes"  . Knee cartilage surgery Left X 3    "open; not scopes"  . Coronary angioplasty with stent placement      "I have 9 stents"  . Cardiac catheterization  X 1  . Anterior cervical decomp/discectomy fusion N/A 09/26/2013    Procedure: ANTERIOR CERVICAL DISCECTOMY FUSION C3-4, plate and screw fixation, allograft bone graft;  Surgeon: Jessy Oto, MD;  Location: [facility];  Service: Orthopedics;  Laterality: N/A;  . Laminectomy  01/28/2014    L3 L4 L5  . Lumbar laminectomy/decompression microdiscectomy N/A 01/27/2014    Procedure: CENTRAL LUMBAR LAMINECTOMY L4-5 AND L3-4;  Surgeon: Jessy Oto, MD;  Location: [facility];  Service: Orthopedics;  Laterality: N/A;  . Joint replacement Bilateral     knees  . Colonoscopy    . Vasectomy    . Fusion of talonavicular joint Right 04/15/2014    dr duda  . Ankle fusion Right 04/15/2014    Procedure: Right Subtalar, Talonavicular Fusion;  Surgeon: Newt Minion, MD;  Location: [facility];  Service: Orthopedics;  Laterality: Right;  . Left heart catheterization with coronary angiogram N/A 02/10/2013    Procedure: LEFT HEART CATHETERIZATION WITH CORONARY ANGIOGRAM;  Surgeon: Burnell Blanks, MD;  Location: [facility] C[facility] LAB;  Service: Cardiovascular;  Laterality: N/A;    There were no vitals filed for  this visit.  Visit Diagnosis:  Abnormality of gait  Muscle weakness      Subjective Assessment - 08/25/14 0852    Subjective Pt denied falls or changes since last visit. Pt reported he has been performing HEP at home.   Pertinent History 3 Cervical and lumbar surgeries over last 9 months, R ankle fusion 03/2014, diabetes, hx of MI and 9 heart stents, R rotator cuff surgery 4 years ago with limited R shoulder AROM   Patient Stated Goals Be able to walk without a cane, get back to walking 6-7 miles per day   Currently in Pain? No/denies                       [facility] Adult PT Treatment/Exercise - 08/25/14 0853    Ambulation/Gait   Ambulation/Gait Yes   Ambulation/Gait Assistance 5: Supervision   Ambulation/Gait Assistance Details Pt ambulated over even and uneven terrain (with and without head turns) with SPC for 10 minutes before requiring seated rest break due to fatigue and L hip pain.  One LOB outdoors which pt self corrected with stepping strategy. Pt required seated rest break after 2 bouts of amb. VC's for upright posture and sequencing with SPC. Cues to improve weight shifting when traversing ramps.   Ambulation Distance (Feet) --  230'x3 indoors and 150' outdoors, 230' with head turns   Assistive device Straight cane   Gait Pattern Step-through pattern;Decreased stride length;Trendelenburg;Lateral trunk lean to right   Ambulation Surface Level;Indoor;Unlevel;Outdoor;Paved   Gait velocity 2.26f/sec.  with SPC (not bariatric SPC)                PT Education - 08/25/14 1345    Education provided Yes   Education Details Walking program HEP   Person(s) Educated Patient   Methods Explanation;Demonstration   Comprehension Verbalized understanding;Returned demonstration          PT Short Term Goals - 08/18/14 1433    PT SHORT TERM GOAL #1   Title Pt will be independent in HEP to improve functional mobilty. Target date 09/01/14.   Status On-going   PT  SHORT TERM GOAL #2   Title Pt will improve BERG score to >/=39/56 to decrease falls risk. Target date 09/01/14.   Status On-going   PT SHORT TERM GOAL #3   Title Pt will report zero falls over the last 4 weeks to improve safety during functional mobililty. Target date 09/01/14.   Status On-going   PT SHORT TERM GOAL #4   Title Pt will ambulate 300' over even terrain with LRAD and supervision to improve functional mobility. Target  date 09/01/14.   Status On-going           PT Long Term Goals - 08/18/14 1434    PT LONG TERM GOAL #1   Title Pt will verbalize understanding of fall prevention strategies to reduce falls risk. Target date 09/29/14.   Status On-going   PT LONG TERM GOAL #2   Title Pt will improve BERG score to 43/56 to decrease falls risk. Target date 09/29/14.   Status On-going   PT LONG TERM GOAL #3   Title Pt will improve FOTO score by 10 points to improve quality of life. Target date 09/29/14.   Status On-going   PT LONG TERM GOAL #4   Title Pt will perform TUG in </=13.5 seconds with LRAD to decrease falls risk. Target date 09/29/14.   Status On-going   PT LONG TERM GOAL #5   Title Pt will improve gait speed with LRAD to >/=1.8 ft/sec. to ambulate without risk for recurrent falls. Target date 09/29/14.   Status On-going   PT LONG TERM GOAL #6   Title Pt will ambulate 600' with LRAD over even/uneven terrain at MOD I level to improve functional mobility. Target date 09/29/14.   Status On-going               Plan - 08/25/14 1347    Clinical Impression Statement Pt demonstrated progress as he improve gait speed from 1.6f/sec to 2.469fsec using SPC vs. bariatric SPC. Pt reported bariatric cane is too heavy, and he drags it behind his B LEs which leads to impaired balance due to lateral trunk lean and LE weakness.  Continue with POC.   Pt will benefit from skilled therapeutic intervention in order to improve on the following deficits Abnormal gait;Decreased  endurance;Obesity;Decreased knowledge of use of DME;Decreased balance;Decreased strength;Decreased mobility;Pain   Rehab Potential Good   PT Frequency 2x / week   PT Duration 8 weeks   PT Treatment/Interventions ADLs/Self Care Home Management;Gait training;Neuromuscular re-education;Stair training;Ultrasound;Biofeedback;Functional mobility training;Patient/family education;Cryotherapy;Therapeutic activities;Electrical Stimulation;Therapeutic exercise;Manual techniques;DME Instruction;Balance training   PT Next Visit Plan dynamic gait (with SPC) and balance training.   Consulted and Agree with Plan of Care Patient        Problem List Patient Active Problem List   Diagnosis Date Noted  . General weakness 07/14/2014  . Urinary incontinence 07/14/2014  . Diabetes mellitus 05/06/2014  . Arthritis of foot, right, degenerative 04/15/2014  . Postoperative anemia due to acute blood loss 01/30/2014    Class: Acute  . Hypotension, unspecified 01/30/2014    Class: Acute  . Spinal stenosis, lumbar 01/27/2014  . Cellulitis of leg, right 10/15/2013  . Headache(784.0) 10/15/2013  . Type 1 diabetes, uncontrolled, with peripheral circulatory disorder 10/04/2013  . Acute posthemorrhagic anemia 10/04/2013  . Acute urinary retention 09/30/2013    Class: Acute  . Spinal stenosis in cervical region 09/26/2013    Class: Chronic  . Spinal stenosis, lumbar region, with neurogenic claudication 09/26/2013    Class: Chronic  . Cervical stenosis of spinal canal 09/26/2013  . Neck pain 09/02/2013  . Hand joint pain 06/10/2013  . Rotator cuff tear arthropathy of right shoulder 06/10/2013  . Skin lesion of cheek 05/01/2013  . Pain of right thumb 04/03/2013  . Balance disorder 03/12/2013  . Gait disorder 03/12/2013  . Tremor 03/12/2013  . Left hip pain 03/12/2013  . Low back pain 03/12/2013  . Pre-ulcerative corn or callous 02/06/2013  . Polypharmacy 06/25/2012    Class: Chronic  . Balance problems  12/25/2011  . Anxiety 11/12/2011  . OSA (obstructive sleep apnea) 11/07/2011  . Bradycardia 10/20/2011  . Insomnia 10/04/2011  . Lumbar degenerative disc disease 07/11/2011  . Obesity 01/12/2011  . Preventative health care 09/27/2010  . ERECTILE DYSFUNCTION, ORGANIC 05/30/2010  . [place] DISEASE, LUMBAR 04/19/2010  . SCIATICA, LEFT 04/19/2010  . Chronic pain syndrome 10/27/2009  . Hyperlipidemia 07/15/2009  . Depression 06/24/2009  . Essential hypertension 06/24/2009  . CAD (coronary artery disease) 06/24/2009  . Allergic rhinitis 06/24/2009  . URETHRAL STRICTURE 06/24/2009  . DEGENERATIVE JOINT DISEASE 06/24/2009  . SHOULDER PAIN, BILATERAL 06/24/2009  . FATIGUE 06/24/2009  . NEPHROLITHIASIS, HX OF 06/24/2009    Liana Camerer L 08/25/2014, 1:49 PM  [facility] [address] [place], [place], [address] Phone: [phone]   Fax:  [phone]     Geoffry Paradise, PT,DPT 08/25/2014 1:49 PM Phone: [phone] Fax: [phone]

## 2014-08-25 NOTE — Patient Instructions (Signed)
**  Walking Program:  Start by walking for 10 minutes, 2-3 times a week. Choose a location with even surfaces (not too many hills). Increase walking time by 5 minutes once 10 minutes becomes easy, with a goal of reaching one hour. Once the one hour becomes easy increase to 4 days a week, and slowly progress to every day.

## 2014-08-26 ENCOUNTER — Ambulatory Visit (INDEPENDENT_AMBULATORY_CARE_PROVIDER_SITE_OTHER): Payer: Medicare PPO | Admitting: Psychology

## 2014-08-26 ENCOUNTER — Ambulatory Visit: Payer: Medicare PPO

## 2014-08-26 DIAGNOSIS — M4806 Spinal stenosis, lumbar region: Secondary | ICD-10-CM | POA: Diagnosis not present

## 2014-08-26 DIAGNOSIS — R269 Unspecified abnormalities of gait and mobility: Secondary | ICD-10-CM

## 2014-08-26 DIAGNOSIS — M6281 Muscle weakness (generalized): Secondary | ICD-10-CM

## 2014-08-26 DIAGNOSIS — F332 Major depressive disorder, recurrent severe without psychotic features: Secondary | ICD-10-CM | POA: Diagnosis not present

## 2014-08-26 NOTE — Therapy (Signed)
[facility] [address] [place], [place], [address] Phone: [phone]   Fax:  [phone]  Physical Therapy Treatment  Patient Details  Name: Travis Masden Sr. MRN: [id number] Date of Birth: November 24, 1950 Referring Provider:  [name], MD  Encounter Date: 08/26/2014      PT End of Session - 08/26/14 1129    Visit Number 5   Number of Visits 17   Date for PT Re-Evaluation 10/03/14   Authorization Type [employer], g-code every 10th visit.   PT Start Time 0848   PT Stop Time 0927   PT Time Calculation (min) 39 min   Activity Tolerance Patient tolerated treatment well   Behavior During Therapy [facility] for tasks assessed/performed      Past Medical History  Diagnosis Date  . Impaired glucose tolerance 09/27/2010  . HYPERLIPIDEMIA-MIXED 07/15/2009  . Chronic pain syndrome 10/27/2009  . HYPERTENSION 06/24/2009  . CORONARY ARTERY DISEASE 06/24/2009    a. s/p multiple PCIs - In 2008 he had a Taxus DES to the mild LAD, Endeavor DES to mid LCX and distal LCX. In January 2009 he had DES to distal LCX, mid LCX and proximal LCX. In November 2009 had BMS x 2 to the mid RCA. Cath 10/2011 with patent stents, noncardiac CP. LHC 01/2013: patent stents (noncardiac CP).  [name] URETHRAL STRICTURE 06/24/2009  . SHOULDER PAIN, BILATERAL 06/24/2009  . ANKLE PAIN, RIGHT 06/24/2009  . SCIATICA, LEFT 04/19/2010  . NEPHROLITHIASIS, HX OF 06/24/2009  . ERECTILE DYSFUNCTION, ORGANIC 05/30/2010  . Obesity   . ALLERGIC RHINITIS 06/24/2009  . Anxiety   . Depression     Prior suicide attempt  . Irregular heart beat   . Pneumonia   . Self-catheterizes urinary bladder   . Anginal pain   . Type II diabetes mellitus   . Hepatitis C dx'd 1979  . DEGENERATIVE JOINT DISEASE 06/24/2009  . [place] DISEASE, LUMBAR 04/19/2010  . Arthritis     "all my joints" (09/30/2013)  . Fibromyalgia     "left leg" (09/30/2013)  . Chronic back pain     "my whole back" (09/30/2013)  . Kidney  stones     "passed on my own"  . Myocardial infarction 2008  . OSA (obstructive sleep apnea)     not using CPAP (09/30/2013)  . Heart murmur     Past Surgical History  Procedure Laterality Date  . Shoulder open rotator cuff repair Right X 2  . Total knee arthroplasty Bilateral 2008  . Carpal tunnel release Bilateral   . Urethral dilation  X 4  . Knee arthroscopy Right X 7  . Umbilical hernia repair      UHR  . Hernia repair      umbilical  . Shoulder arthroscopy w/ rotator cuff repair Bilateral     "3on the left; 2 on the right"  . Tonsillectomy    . Knee cartilage surgery Right X 4    "open; not scopes"  . Knee cartilage surgery Left X 3    "open; not scopes"  . Coronary angioplasty with stent placement      "I have 9 stents"  . Cardiac catheterization  X 1  . Anterior cervical decomp/discectomy fusion N/A 09/26/2013    Procedure: ANTERIOR CERVICAL DISCECTOMY FUSION C3-4, plate and screw fixation, allograft bone graft;  Surgeon: Jessy Oto, MD;  Location: [facility];  Service: Orthopedics;  Laterality: N/A;  . Laminectomy  01/28/2014    L3 L4 L5  .  Lumbar laminectomy/decompression microdiscectomy N/A 01/27/2014    Procedure: CENTRAL LUMBAR LAMINECTOMY L4-5 AND L3-4;  Surgeon: Jessy Oto, MD;  Location: [facility];  Service: Orthopedics;  Laterality: N/A;  . Joint replacement Bilateral     knees  . Colonoscopy    . Vasectomy    . Fusion of talonavicular joint Right 04/15/2014    dr duda  . Ankle fusion Right 04/15/2014    Procedure: Right Subtalar, Talonavicular Fusion;  Surgeon: Newt Minion, MD;  Location: [facility];  Service: Orthopedics;  Laterality: Right;  . Left heart catheterization with coronary angiogram N/A 02/10/2013    Procedure: LEFT HEART CATHETERIZATION WITH CORONARY ANGIOGRAM;  Surgeon: Burnell Blanks, MD;  Location: [facility] CATH LAB;  Service: Cardiovascular;  Laterality: N/A;    There were no vitals filed for this visit.  Visit Diagnosis:  Abnormality of  gait  Muscle weakness      Subjective Assessment - 08/26/14 0854    Subjective Pt denied falls since last visit.   Pertinent History 3 Cervical and lumbar surgeries over last 9 months, R ankle fusion 03/2014, diabetes, hx of MI and 9 heart stents, R rotator cuff surgery 4 years ago with limited R shoulder AROM   Patient Stated Goals Be able to walk without a cane, get back to walking 6-7 miles per day   Currently in Pain? Yes   Pain Score 5    Pain Location Back   Pain Orientation Lower   Pain Descriptors / Indicators Sharp   Pain Type Chronic pain   Pain Onset More than a month ago   Pain Frequency Constant   Aggravating Factors  not sure   Pain Relieving Factors medication   Multiple Pain Sites Yes   Pain Score 7   Pain Location Ankle   Pain Orientation Right   Pain Descriptors / Indicators Sharp   Pain Type Chronic pain   Pain Onset More than a month ago   Pain Frequency Intermittent   Aggravating Factors  walking   Pain Relieving Factors rest and meds                       [facility] Adult PT Treatment/Exercise - 08/26/14 0857    Ambulation/Gait   Ambulation/Gait Yes   Ambulation/Gait Assistance 5: Supervision   Ambulation/Gait Assistance Details Pt ambulated while performing head turns and counting playing cards (pt missed 3 cards). VC's to continue ambulation while looking for cards and to improve stride length. Pt required 3 seated rest breaks due to R foot and back pain.   Ambulation Distance (Feet) --  230' x2, 117'x2   Assistive device Straight cane   Gait Pattern Step-through pattern;Decreased stride length;Trendelenburg;Lateral trunk lean to right;Antalgic;Decreased step length - left   Ambulation Surface Level;Indoor   Balance   Balance Assessed Yes   High Level Balance   High Level Balance Activities Side stepping;Backward walking;Marching forwards   High Level Balance Comments Performed at counter with 1-2 UE support on counter and SPC,  6x7'/activity. VC's to improve upright posture, improve stride length and weight shifting. Pt required one seated rest break after performing balance activities 2/2 fatigue. Pt reported back pain is now 3/10 and R ankle pain is 5/10 after session.                PT Education - 08/26/14 1129    Education provided Yes   Education Details PT informed pt that [facility] prescription is available at MD office.  Person(s) Educated Patient   Methods Explanation   Comprehension Verbalized understanding          PT Short Term Goals - 08/18/14 1433    PT SHORT TERM GOAL #1   Title Pt will be independent in HEP to improve functional mobilty. Target date 09/01/14.   Status On-going   PT SHORT TERM GOAL #2   Title Pt will improve BERG score to >/=39/56 to decrease falls risk. Target date 09/01/14.   Status On-going   PT SHORT TERM GOAL #3   Title Pt will report zero falls over the last 4 weeks to improve safety during functional mobililty. Target date 09/01/14.   Status On-going   PT SHORT TERM GOAL #4   Title Pt will ambulate 300' over even terrain with LRAD and supervision to improve functional mobility. Target date 09/01/14.   Status On-going           PT Long Term Goals - 08/18/14 1434    PT LONG TERM GOAL #1   Title Pt will verbalize understanding of fall prevention strategies to reduce falls risk. Target date 09/29/14.   Status On-going   PT LONG TERM GOAL #2   Title Pt will improve BERG score to 43/56 to decrease falls risk. Target date 09/29/14.   Status On-going   PT LONG TERM GOAL #3   Title Pt will improve FOTO score by 10 points to improve quality of life. Target date 09/29/14.   Status On-going   PT LONG TERM GOAL #4   Title Pt will perform TUG in </=13.5 seconds with LRAD to decrease falls risk. Target date 09/29/14.   Status On-going   PT LONG TERM GOAL #5   Title Pt will improve gait speed with LRAD to >/=1.8 ft/sec. to ambulate without risk for recurrent falls. Target  date 09/29/14.   Status On-going   PT LONG TERM GOAL #6   Title Pt will ambulate 600' with LRAD over even/uneven terrain at MOD I level to improve functional mobility. Target date 09/29/14.   Status On-going               Plan - 08/26/14 1130    Clinical Impression Statement Pt limited by pain during first half of session today, as he required frequent seated rest breaks. However, pt reported decrease in pain at the end of session. No LOB noted during ambulation today. Continue with POC.   Pt will benefit from skilled therapeutic intervention in order to improve on the following deficits Abnormal gait;Decreased endurance;Obesity;Decreased knowledge of use of DME;Decreased balance;Decreased strength;Decreased mobility;Pain   Rehab Potential Good   PT Frequency 2x / week   PT Duration 8 weeks   PT Treatment/Interventions ADLs/Self Care Home Management;Gait training;Neuromuscular re-education;Stair training;Ultrasound;Biofeedback;Functional mobility training;Patient/family education;Cryotherapy;Therapeutic activities;Electrical Stimulation;Therapeutic exercise;Manual techniques;DME Instruction;Balance training   PT Next Visit Plan Check STGs.   PT Home Exercise Plan Balance and strengthening HEP   Consulted and Agree with Plan of Care Patient        Problem List Patient Active Problem List   Diagnosis Date Noted  . General weakness 07/14/2014  . Urinary incontinence 07/14/2014  . Diabetes mellitus 05/06/2014  . Arthritis of foot, right, degenerative 04/15/2014  . Postoperative anemia due to acute blood loss 01/30/2014    Class: Acute  . Hypotension, unspecified 01/30/2014    Class: Acute  . Spinal stenosis, lumbar 01/27/2014  . Cellulitis of leg, right 10/15/2013  . Headache(784.0) 10/15/2013  . Type 1 diabetes, uncontrolled, with peripheral circulatory  disorder 10/04/2013  . Acute posthemorrhagic anemia 10/04/2013  . Acute urinary retention 09/30/2013    Class: Acute  .  Spinal stenosis in cervical region 09/26/2013    Class: Chronic  . Spinal stenosis, lumbar region, with neurogenic claudication 09/26/2013    Class: Chronic  . Cervical stenosis of spinal canal 09/26/2013  . Neck pain 09/02/2013  . Hand joint pain 06/10/2013  . Rotator cuff tear arthropathy of right shoulder 06/10/2013  . Skin lesion of cheek 05/01/2013  . Pain of right thumb 04/03/2013  . Balance disorder 03/12/2013  . Gait disorder 03/12/2013  . Tremor 03/12/2013  . Left hip pain 03/12/2013  . Low back pain 03/12/2013  . Pre-ulcerative corn or callous 02/06/2013  . Polypharmacy 06/25/2012    Class: Chronic  . Balance problems 12/25/2011  . Anxiety 11/12/2011  . OSA (obstructive sleep apnea) 11/07/2011  . Bradycardia 10/20/2011  . Insomnia 10/04/2011  . Lumbar degenerative disc disease 07/11/2011  . Obesity 01/12/2011  . Preventative health care 09/27/2010  . ERECTILE DYSFUNCTION, ORGANIC 05/30/2010  . [place] DISEASE, LUMBAR 04/19/2010  . SCIATICA, LEFT 04/19/2010  . Chronic pain syndrome 10/27/2009  . Hyperlipidemia 07/15/2009  . Depression 06/24/2009  . Essential hypertension 06/24/2009  . CAD (coronary artery disease) 06/24/2009  . Allergic rhinitis 06/24/2009  . URETHRAL STRICTURE 06/24/2009  . DEGENERATIVE JOINT DISEASE 06/24/2009  . SHOULDER PAIN, BILATERAL 06/24/2009  . FATIGUE 06/24/2009  . NEPHROLITHIASIS, HX OF 06/24/2009    Perrie Ragin L 08/26/2014, 11:32 AM  [facility] [address] [place], [place], [address] Phone: [phone]   Fax:  [phone]     Geoffry Paradise, PT,DPT 08/26/2014 11:32 AM Phone: [phone] Fax: [phone]

## 2014-09-01 ENCOUNTER — Ambulatory Visit: Payer: Medicare PPO

## 2014-09-01 DIAGNOSIS — M6281 Muscle weakness (generalized): Secondary | ICD-10-CM

## 2014-09-01 DIAGNOSIS — M4806 Spinal stenosis, lumbar region: Secondary | ICD-10-CM | POA: Diagnosis not present

## 2014-09-01 DIAGNOSIS — R269 Unspecified abnormalities of gait and mobility: Secondary | ICD-10-CM

## 2014-09-01 NOTE — Therapy (Signed)
[facility] [address] [place], [place], [address] Phone: [phone]   Fax:  [phone]  Physical Therapy Treatment  Patient Details  Name: Travis Widrig Sr. MRN: [id number] Date of Birth: 1950-12-31 Referring Provider:  [name], MD  Encounter Date: 09/01/2014      PT End of Session - 09/01/14 1131    Visit Number 6   Number of Visits 17   Date for PT Re-Evaluation 10/03/14   Authorization Type [employer], g-code every 10th visit.   PT Start Time 0845   PT Stop Time 0928   PT Time Calculation (min) 43 min   Equipment Utilized During Treatment Gait belt   Activity Tolerance Patient tolerated treatment well      Past Medical History  Diagnosis Date  . Impaired glucose tolerance 09/27/2010  . HYPERLIPIDEMIA-MIXED 07/15/2009  . Chronic pain syndrome 10/27/2009  . HYPERTENSION 06/24/2009  . CORONARY ARTERY DISEASE 06/24/2009    a. s/p multiple PCIs - In 2008 he had a Taxus DES to the mild LAD, Endeavor DES to mid LCX and distal LCX. In January 2009 he had DES to distal LCX, mid LCX and proximal LCX. In November 2009 had BMS x 2 to the mid RCA. Cath 10/2011 with patent stents, noncardiac CP. LHC 01/2013: patent stents (noncardiac CP).  [name] URETHRAL STRICTURE 06/24/2009  . SHOULDER PAIN, BILATERAL 06/24/2009  . ANKLE PAIN, RIGHT 06/24/2009  . SCIATICA, LEFT 04/19/2010  . NEPHROLITHIASIS, HX OF 06/24/2009  . ERECTILE DYSFUNCTION, ORGANIC 05/30/2010  . Obesity   . ALLERGIC RHINITIS 06/24/2009  . Anxiety   . Depression     Prior suicide attempt  . Irregular heart beat   . Pneumonia   . Self-catheterizes urinary bladder   . Anginal pain   . Type II diabetes mellitus   . Hepatitis C dx'd 1979  . DEGENERATIVE JOINT DISEASE 06/24/2009  . [place] DISEASE, LUMBAR 04/19/2010  . Arthritis     "all my joints" (09/30/2013)  . Fibromyalgia     "left leg" (09/30/2013)  . Chronic back pain     "my whole back" (09/30/2013)  . Kidney stones     "passed on my own"  . Myocardial infarction 2008  . OSA (obstructive sleep apnea)     not using CPAP (09/30/2013)  . Heart murmur     Past Surgical History  Procedure Laterality Date  . Shoulder open rotator cuff repair Right X 2  . Total knee arthroplasty Bilateral 2008  . Carpal tunnel release Bilateral   . Urethral dilation  X 4  . Knee arthroscopy Right X 7  . Umbilical hernia repair      UHR  . Hernia repair      umbilical  . Shoulder arthroscopy w/ rotator cuff repair Bilateral     "3on the left; 2 on the right"  . Tonsillectomy    . Knee cartilage surgery Right X 4    "open; not scopes"  . Knee cartilage surgery Left X 3    "open; not scopes"  . Coronary angioplasty with stent placement      "I have 9 stents"  . Cardiac catheterization  X 1  . Anterior cervical decomp/discectomy fusion N/A 09/26/2013    Procedure: ANTERIOR CERVICAL DISCECTOMY FUSION C3-4, plate and screw fixation, allograft bone graft;  Surgeon: Jessy Oto, MD;  Location: [facility];  Service: Orthopedics;  Laterality: N/A;  . Laminectomy  01/28/2014    L3 L4 L5  . Lumbar  laminectomy/decompression microdiscectomy N/A 01/27/2014    Procedure: CENTRAL LUMBAR LAMINECTOMY L4-5 AND L3-4;  Surgeon: Jessy Oto, MD;  Location: [facility];  Service: Orthopedics;  Laterality: N/A;  . Joint replacement Bilateral     knees  . Colonoscopy    . Vasectomy    . Fusion of talonavicular joint Right 04/15/2014    dr duda  . Ankle fusion Right 04/15/2014    Procedure: Right Subtalar, Talonavicular Fusion;  Surgeon: Newt Minion, MD;  Location: [facility];  Service: Orthopedics;  Laterality: Right;  . Left heart catheterization with coronary angiogram N/A 02/10/2013    Procedure: LEFT HEART CATHETERIZATION WITH CORONARY ANGIOGRAM;  Surgeon: Burnell Blanks, MD;  Location[facility];  Service: Cardiovascular;  Laterality: N/A;    There were no vitals filed for this visit.  Visit Diagnosis:  Abnormality of gait  Muscle  weakness      Subjective Assessment - 09/01/14 0849    Subjective Pt denied falls or changes since last visit. Pt has not tried walking program as he has difficulty traversing hill by his home and does not have a car to drive to a mall/track.   Pertinent History 3 Cervical and lumbar surgeries over last 9 months, R ankle fusion 03/2014, diabetes, hx of MI and 9 heart stents, R rotator cuff surgery 4 years ago with limited R shoulder AROM   Patient Stated Goals Be able to walk without a cane, get back to walking 6-7 miles per day   Currently in Pain? Yes   Pain Score 7    Pain Location Knee   Pain Orientation Right   Pain Descriptors / Indicators Sharp   Pain Type Chronic pain   Pain Onset More than a month ago   Pain Frequency Intermittent   Aggravating Factors  sitting   Pain Relieving Factors walking                       [facility] Adult PT Treatment/Exercise - 09/01/14 0851    Ambulation/Gait   Ambulation/Gait Yes   Ambulation/Gait Assistance 5: Supervision   Ambulation/Gait Assistance Details Cues to improve B heel strike and stride length. Pt noted to experience decreased R lateral trunk lean with SPC at increased height. Pt's sequencing is improved with SPC vs. bariatric SPC.   Ambulation Distance (Feet) 400 Feet   Assistive device Straight cane   Gait Pattern Step-through pattern;Decreased stride length;Trendelenburg;Lateral trunk lean to right;Antalgic;Decreased step length - left   Ambulation Surface Level;Indoor   Balance   Balance Assessed Yes   Static Standing Balance   Static Standing - Balance Support No upper extremity supported;Bilateral upper extremity supported   Static Standing - Level of Assistance 5: Stand by assistance   Static Standing - Comment/# of Minutes Reviewed HEP and added to HEP: standing in corner with chair for safety: B LEs on non-compliant suface: feet together/apart with and without head turns, feet together/apart with eyes closed,  modified tandem, single leg stance with B UE support. All with 10-30 second holds. 3 LOB episodes which pt self corrected with hands on chair.    Dynamic Standing Balance   Dynamic Standing - Balance Support Right upper extremity supported   Dynamic Standing - Level of Assistance 5: Stand by assistance   Dynamic Standing - Balance Activities Other (comment)   Dynamic Standing - Comments With one UE support on counter: B marches x10/LE. VC's for technique and to improve hip flexion.   Standardized Balance  Assessment   Standardized Balance Assessment Berg Balance Test   Berg Balance Test   Sit to Stand Able to stand without using hands and stabilize independently   Standing Unsupported Able to stand 2 minutes with supervision   Sitting with Back Unsupported but Feet Supported on Floor or Stool Able to sit safely and securely 2 minutes   Stand to Sit Sits safely with minimal use of hands   Transfers Able to transfer safely, definite need of hands   Standing Unsupported with Eyes Closed Able to stand 10 seconds with supervision   Standing Ubsupported with Feet Together Able to place feet together independently and stand 1 minute safely   From Standing, Reach Forward with Outstretched Arm Can reach confidently >25 cm (10")  13"   From Standing Position, Pick up Object from Floor Able to pick up shoe safely and easily   From Standing Position, Turn to Look Behind Over each Shoulder Looks behind from both sides and weight shifts well   Turn 360 Degrees Able to turn 360 degrees safely but slowly   Standing Unsupported, Alternately Place Feet on Step/Stool Needs assistance to keep from falling or unable to try   Standing Unsupported, One Foot in Front Able to plae foot ahead of the other independently and hold 30 seconds   Standing on One Leg Tries to lift leg/unable to hold 3 seconds but remains standing independently   Total Score 43                PT Education - 09/01/14 1131     Education provided Yes   Education Details Reviewed HEP and progressed balance HEP as tolerated.   Person(s) Educated Patient   Methods Explanation;Demonstration;Verbal cues;Handout   Comprehension Verbalized understanding;Returned demonstration          PT Short Term Goals - 09/01/14 1133    PT SHORT TERM GOAL #1   Title Pt will be independent in HEP to improve functional mobilty. Target date 09/01/14.   Status On-going   PT SHORT TERM GOAL #2   Title Pt will improve BERG score to >/=39/56 to decrease falls risk. Target date 09/01/14.   Status Achieved   PT SHORT TERM GOAL #3   Title Pt will report zero falls over the last 4 weeks to improve safety during functional mobililty. Target date 09/01/14.   Status Achieved   PT SHORT TERM GOAL #4   Title Pt will ambulate 300' over even terrain with LRAD and supervision to improve functional mobility. Target date 09/01/14.   Status Achieved           PT Long Term Goals - 09/01/14 1133    PT LONG TERM GOAL #1   Title Pt will verbalize understanding of fall prevention strategies to reduce falls risk. Target date 09/29/14.   Status On-going   PT LONG TERM GOAL #2   Title Pt will improve BERG score to 47/56 to decrease falls risk. Target date 09/29/14.   Baseline Revised from 43 to 47, as pt met LTG on 09/01/14.   Status Revised   PT LONG TERM GOAL #3   Title Pt will improve FOTO score by 10 points to improve quality of life. Target date 09/29/14.   Status On-going   PT LONG TERM GOAL #4   Title Pt will perform TUG in </=13.5 seconds with LRAD to decrease falls risk. Target date 09/29/14.   Status On-going   PT LONG TERM GOAL #5   Title Pt will  improve gait speed with LRAD to >/=1.8 ft/sec. to ambulate without risk for recurrent falls. Target date 09/29/14.   Status On-going   PT LONG TERM GOAL #6   Title Pt will ambulate 600' with LRAD over even/uneven terrain at MOD I level to improve functional mobility. Target date 09/29/14.   Status  On-going               Plan - 09/01/14 1132    Clinical Impression Statement Pt demonstrated progress, as he met STGs 2, 3, and 4. Pt also met BERG LTG. PT will finish assessing HEP STG next session. Pt's BERG score of 43/56 continues to indicated is at risk for falls. Continue with POC.   Pt will benefit from skilled therapeutic intervention in order to improve on the following deficits Abnormal gait;Decreased endurance;Obesity;Decreased knowledge of use of DME;Decreased balance;Decreased strength;Decreased mobility;Pain   Rehab Potential Good   PT Frequency 2x / week   PT Duration 8 weeks   PT Treatment/Interventions ADLs/Self Care Home Management;Gait training;Neuromuscular re-education;Stair training;Ultrasound;Biofeedback;Functional mobility training;Patient/family education;Cryotherapy;Therapeutic activities;Electrical Stimulation;Therapeutic exercise;Manual techniques;DME Instruction;Balance training   PT Next Visit Plan Finish assessing strengthening HEP STG.   PT Home Exercise Plan Balance and strengthening HEP   Consulted and Agree with Plan of Care Patient        Problem List Patient Active Problem List   Diagnosis Date Noted  . General weakness 07/14/2014  . Urinary incontinence 07/14/2014  . Diabetes mellitus 05/06/2014  . Arthritis of foot, right, degenerative 04/15/2014  . Postoperative anemia due to acute blood loss 01/30/2014    Class: Acute  . Hypotension, unspecified 01/30/2014    Class: Acute  . Spinal stenosis, lumbar 01/27/2014  . Cellulitis of leg, right 10/15/2013  . Headache(784.0) 10/15/2013  . Type 1 diabetes, uncontrolled, with peripheral circulatory disorder 10/04/2013  . Acute posthemorrhagic anemia 10/04/2013  . Acute urinary retention 09/30/2013    Class: Acute  . Spinal stenosis in cervical region 09/26/2013    Class: Chronic  . Spinal stenosis, lumbar region, with neurogenic claudication 09/26/2013    Class: Chronic  . Cervical  stenosis of spinal canal 09/26/2013  . Neck pain 09/02/2013  . Hand joint pain 06/10/2013  . Rotator cuff tear arthropathy of right shoulder 06/10/2013  . Skin lesion of cheek 05/01/2013  . Pain of right thumb 04/03/2013  . Balance disorder 03/12/2013  . Gait disorder 03/12/2013  . Tremor 03/12/2013  . Left hip pain 03/12/2013  . Low back pain 03/12/2013  . Pre-ulcerative corn or callous 02/06/2013  . Polypharmacy 06/25/2012    Class: Chronic  . Balance problems 12/25/2011  . Anxiety 11/12/2011  . OSA (obstructive sleep apnea) 11/07/2011  . Bradycardia 10/20/2011  . Insomnia 10/04/2011  . Lumbar degenerative disc disease 07/11/2011  . Obesity 01/12/2011  . Preventative health care 09/27/2010  . ERECTILE DYSFUNCTION, ORGANIC 05/30/2010  . [place] DISEASE, LUMBAR 04/19/2010  . SCIATICA, LEFT 04/19/2010  . Chronic pain syndrome 10/27/2009  . Hyperlipidemia 07/15/2009  . Depression 06/24/2009  . Essential hypertension 06/24/2009  . CAD (coronary artery disease) 06/24/2009  . Allergic rhinitis 06/24/2009  . URETHRAL STRICTURE 06/24/2009  . DEGENERATIVE JOINT DISEASE 06/24/2009  . SHOULDER PAIN, BILATERAL 06/24/2009  . FATIGUE 06/24/2009  . NEPHROLITHIASIS, HX OF 06/24/2009    Kesa Birky L 09/01/2014, 11:34 AM  [facility] [address] [place], [place], [address] Phone: [phone]   Fax:  [phone]     Geoffry Paradise, PT,DPT 09/01/2014 11:34 AM Phone:  [phone] Fax: [phone]

## 2014-09-01 NOTE — Patient Instructions (Signed)
Stop the feet apart with eyes closed and with eyes open with head turns and perform with feet together.   Marching in Place: Varied Surfaces   March in place, slowly lifting knees toward ceiling. Repeat _10___ times per session. Do __1__ sessions per day.  Copyright  VHI. All rights reserved.  Feet Together, Head Motion - Eyes Open   With eyes open, feet together, move head slowly: up and down and side to side for 30 seconds. Repeat __3__ times per session. Do __1__ sessions per day.  Copyright  VHI. All rights reserved.  Feet Together, Varied Arm Positions - Eyes Closed   Stand with feet together and arms at your side. Close eyes and visualize upright position. Hold __30__ seconds. Repeat _3___ times per session. Do __1__ sessions per day.  Copyright  VHI. All rights reserved.

## 2014-09-02 ENCOUNTER — Ambulatory Visit: Payer: Medicare PPO

## 2014-09-02 DIAGNOSIS — M6281 Muscle weakness (generalized): Secondary | ICD-10-CM

## 2014-09-02 DIAGNOSIS — M4806 Spinal stenosis, lumbar region: Secondary | ICD-10-CM | POA: Diagnosis not present

## 2014-09-02 DIAGNOSIS — R269 Unspecified abnormalities of gait and mobility: Secondary | ICD-10-CM

## 2014-09-02 NOTE — Patient Instructions (Addendum)
Clamshells: Perform Right leg clams with red theraband tied above your knees and perform Left leg clams without a band (for now). Add band to Left clams when it becomes too easy.  Sit to stands: perform on a lower sofa is possible.  Bridge-stop. BUT still perform tucking in stomach with 5 second holds x10.  EXTENSION: Standing (Active)   Stand, both feet flat. Hold onto counter. Draw right leg behind body as far as possible. Repeat with left leg. Complete _3__ sets of _10__ repetitions. Perform __3-4_ sessions per week. When this becomes easy, add red theraband, tied around ankles.  http://gtsc.exer.us/76   Copyright  VHI. All rights reserved.   Hamstring Curl: Standing (Single Leg)   In shoulder width stance, hold onto counter. Bend same knee, foot toward buttock. Be careful not to lean forward. Repeat _10_ times per set. Repeat with other leg. Do _2_ sets per session. Do _3-4_ sessions per week. When this becomes easy, add one more set of 10 reps.  http://tub.exer.us/197   Copyright  VHI. All rights reserved.

## 2014-09-02 NOTE — Therapy (Signed)
[facility] [address] [place], [place], [address] Phone: [phone]   Fax:  [phone]  Physical Therapy Treatment  Patient Details  Name: Travis Schramm Sr. MRN: [id number] Date of Birth: 1950-09-06 Referring Provider:  [name], MD  Encounter Date: 09/02/2014      PT End of Session - 09/02/14 1218    Visit Number 7   Number of Visits 17   Date for PT Re-Evaluation 10/03/14   Authorization Type [employer], g-code every 10th visit.   PT Start Time [id number]   PT Stop Time 0928   PT Time Calculation (min) 42 min   Activity Tolerance Patient tolerated treatment well   Behavior During Therapy [facility] for tasks assessed/performed      Past Medical History  Diagnosis Date  . Impaired glucose tolerance 09/27/2010  . HYPERLIPIDEMIA-MIXED 07/15/2009  . Chronic pain syndrome 10/27/2009  . HYPERTENSION 06/24/2009  . CORONARY ARTERY DISEASE 06/24/2009    a. s/p multiple PCIs - In 2008 he had a Taxus DES to the mild LAD, Endeavor DES to mid LCX and distal LCX. In January 2009 he had DES to distal LCX, mid LCX and proximal LCX. In November 2009 had BMS x 2 to the mid RCA. Cath 10/2011 with patent stents, noncardiac CP. LHC 01/2013: patent stents (noncardiac CP).  [name] URETHRAL STRICTURE 06/24/2009  . SHOULDER PAIN, BILATERAL 06/24/2009  . ANKLE PAIN, RIGHT 06/24/2009  . SCIATICA, LEFT 04/19/2010  . NEPHROLITHIASIS, HX OF 06/24/2009  . ERECTILE DYSFUNCTION, ORGANIC 05/30/2010  . Obesity   . ALLERGIC RHINITIS 06/24/2009  . Anxiety   . Depression     Prior suicide attempt  . Irregular heart beat   . Pneumonia   . Self-catheterizes urinary bladder   . Anginal pain   . Type II diabetes mellitus   . Hepatitis C dx'd 1979  . DEGENERATIVE JOINT DISEASE 06/24/2009  . [place] DISEASE, LUMBAR 04/19/2010  . Arthritis     "all my joints" (09/30/2013)  . Fibromyalgia     "left leg" (09/30/2013)  . Chronic back pain     "my whole back" (09/30/2013)  . Kidney  stones     "passed on my own"  . Myocardial infarction 2008  . OSA (obstructive sleep apnea)     not using CPAP (09/30/2013)  . Heart murmur     Past Surgical History  Procedure Laterality Date  . Shoulder open rotator cuff repair Right X 2  . Total knee arthroplasty Bilateral 2008  . Carpal tunnel release Bilateral   . Urethral dilation  X 4  . Knee arthroscopy Right X 7  . Umbilical hernia repair      UHR  . Hernia repair      umbilical  . Shoulder arthroscopy w/ rotator cuff repair Bilateral     "3on the left; 2 on the right"  . Tonsillectomy    . Knee cartilage surgery Right X 4    "open; not scopes"  . Knee cartilage surgery Left X 3    "open; not scopes"  . Coronary angioplasty with stent placement      "I have 9 stents"  . Cardiac catheterization  X 1  . Anterior cervical decomp/discectomy fusion N/A 09/26/2013    Procedure: ANTERIOR CERVICAL DISCECTOMY FUSION C3-4, plate and screw fixation, allograft bone graft;  Surgeon: Jessy Oto, MD;  Location: [facility];  Service: Orthopedics;  Laterality: N/A;  . Laminectomy  01/28/2014    L3 L4 L5  .  Lumbar laminectomy/decompression microdiscectomy N/A 01/27/2014    Procedure: CENTRAL LUMBAR LAMINECTOMY L4-5 AND L3-4;  Surgeon: Jessy Oto, MD;  Location: [place];  Service: Orthopedics;  Laterality: N/A;  . Joint replacement Bilateral     knees  . Colonoscopy    . Vasectomy    . Fusion of talonavicular joint Right 04/15/2014    dr duda  . Ankle fusion Right 04/15/2014    Procedure: Right Subtalar, Talonavicular Fusion;  Surgeon: Newt Minion, MD;  Location: [facility];  Service: Orthopedics;  Laterality: Right;  . Left heart catheterization with coronary angiogram N/A 02/10/2013    Procedure: LEFT HEART CATHETERIZATION WITH CORONARY ANGIOGRAM;  Surgeon: Burnell Blanks, MD;  Location: [facility] CATH LAB;  Service: Cardiovascular;  Laterality: N/A;    There were no vitals filed for this visit.  Visit Diagnosis:  Muscle  weakness  Abnormality of gait      Subjective Assessment - 09/02/14 0850    Subjective Pt denied falls or changes since last visit. Pt's cousin passed away and he feels sad today. Pt reported his brother noticed that pt is walking better.   Pertinent History 3 Cervical and lumbar surgeries over last 9 months, R ankle fusion 03/2014, diabetes, hx of MI and 9 heart stents, R rotator cuff surgery 4 years ago with limited R shoulder AROM   Patient Stated Goals Be able to walk without a cane, get back to walking 6-7 miles per day   Currently in Pain? No/denies                   Therex: Reviewed HEP.  Seated B heel/toe raises x20. Pt demonstrated good technique but still has difficulty with R plantarflexion due to ankle pin. -Sidelying L clamshells 3x10 without theraband, R clamshells without band x10 and with red theraband 2x10 . VC's for technique and to keep hips forward. -Supine TrA activation with 5 second hold x5. VC's for technique. -Supine Bridge with TrA activation x10. Pt demonstrated good technique. -Sit<>stand x10 with and without UE support.  -Standing: B hip ext 3x10 and B hamstring curls 2x10. All with 2 UE support. VC's and demonstration for technique.  Pt reported bridge and sit<>stand were becoming easy.                             PT Education - 09/02/14 1217    Education provided Yes   Education Details Reviewed strengthening HEP and progressed as tolerated. See AVS for detail.   Person(s) Educated Patient   Methods Explanation;Tactile cues;Verbal cues;Handout   Comprehension Returned demonstration;Verbalized understanding          PT Short Term Goals - 09/02/14 1220    PT SHORT TERM GOAL #1   Title Pt will be independent in HEP to improve functional mobilty. Target date 09/01/14.   Status Achieved   PT SHORT TERM GOAL #2   Title Pt will improve BERG score to >/=39/56 to decrease falls risk. Target date 09/01/14.   Status  Achieved   PT SHORT TERM GOAL #3   Title Pt will report zero falls over the last 4 weeks to improve safety during functional mobililty. Target date 09/01/14.   Status Achieved   PT SHORT TERM GOAL #4   Title Pt will ambulate 300' over even terrain with LRAD and supervision to improve functional mobility. Target date 09/01/14.   Status Achieved  PT Long Term Goals - 09/01/14 1133    PT LONG TERM GOAL #1   Title Pt will verbalize understanding of fall prevention strategies to reduce falls risk. Target date 09/29/14.   Status On-going   PT LONG TERM GOAL #2   Title Pt will improve BERG score to 47/56 to decrease falls risk. Target date 09/29/14.   Baseline Revised from 43 to 47, as pt met LTG on 09/01/14.   Status Revised   PT LONG TERM GOAL #3   Title Pt will improve FOTO score by 10 points to improve quality of life. Target date 09/29/14.   Status On-going   PT LONG TERM GOAL #4   Title Pt will perform TUG in </=13.5 seconds with LRAD to decrease falls risk. Target date 09/29/14.   Status On-going   PT LONG TERM GOAL #5   Title Pt will improve gait speed with LRAD to >/=1.8 ft/sec. to ambulate without risk for recurrent falls. Target date 09/29/14.   Status On-going   PT LONG TERM GOAL #6   Title Pt will ambulate 600' with LRAD over even/uneven terrain at MOD I level to improve functional mobility. Target date 09/29/14.   Status On-going               Plan - 09/02/14 1219    Clinical Impression Statement Pt demonstrated progress as he met STG 1. Pt was able to tolerate progression of strengthening HEP, indicating strength is improving. Pt would continue to benefit from skilled PT to improve safety during functional mobility.   Pt will benefit from skilled therapeutic intervention in order to improve on the following deficits Abnormal gait;Decreased endurance;Obesity;Decreased knowledge of use of DME;Decreased balance;Decreased strength;Decreased mobility;Pain   Rehab  Potential Good   PT Frequency 2x / week   PT Duration 8 weeks   PT Treatment/Interventions ADLs/Self Care Home Management;Gait training;Neuromuscular re-education;Stair training;Ultrasound;Biofeedback;Functional mobility training;Patient/family education;Cryotherapy;Therapeutic activities;Electrical Stimulation;Therapeutic exercise;Manual techniques;DME Instruction;Balance training   PT Next Visit Plan Ambulation with SPC: Ramps and hills (outdoors) to mimic pt traversing hill at home.   PT Home Exercise Plan Balance and strengthening HEP   Consulted and Agree with Plan of Care Patient        Problem List Patient Active Problem List   Diagnosis Date Noted  . General weakness 07/14/2014  . Urinary incontinence 07/14/2014  . Diabetes mellitus 05/06/2014  . Arthritis of foot, right, degenerative 04/15/2014  . Postoperative anemia due to acute blood loss 01/30/2014    Class: Acute  . Hypotension, unspecified 01/30/2014    Class: Acute  . Spinal stenosis, lumbar 01/27/2014  . Cellulitis of leg, right 10/15/2013  . Headache(784.0) 10/15/2013  . Type 1 diabetes, uncontrolled, with peripheral circulatory disorder 10/04/2013  . Acute posthemorrhagic anemia 10/04/2013  . Acute urinary retention 09/30/2013    Class: Acute  . Spinal stenosis in cervical region 09/26/2013    Class: Chronic  . Spinal stenosis, lumbar region, with neurogenic claudication 09/26/2013    Class: Chronic  . Cervical stenosis of spinal canal 09/26/2013  . Neck pain 09/02/2013  . Hand joint pain 06/10/2013  . Rotator cuff tear arthropathy of right shoulder 06/10/2013  . Skin lesion of cheek 05/01/2013  . Pain of right thumb 04/03/2013  . Balance disorder 03/12/2013  . Gait disorder 03/12/2013  . Tremor 03/12/2013  . Left hip pain 03/12/2013  . Low back pain 03/12/2013  . Pre-ulcerative corn or callous 02/06/2013  . Polypharmacy 06/25/2012    Class: Chronic  . Balance  problems 12/25/2011  . Anxiety  11/12/2011  . OSA (obstructive sleep apnea) 11/07/2011  . Bradycardia 10/20/2011  . Insomnia 10/04/2011  . Lumbar degenerative disc disease 07/11/2011  . Obesity 01/12/2011  . Preventative health care 09/27/2010  . ERECTILE DYSFUNCTION, ORGANIC 05/30/2010  . [place] DISEASE, LUMBAR 04/19/2010  . SCIATICA, LEFT 04/19/2010  . Chronic pain syndrome 10/27/2009  . Hyperlipidemia 07/15/2009  . Depression 06/24/2009  . Essential hypertension 06/24/2009  . CAD (coronary artery disease) 06/24/2009  . Allergic rhinitis 06/24/2009  . URETHRAL STRICTURE 06/24/2009  . DEGENERATIVE JOINT DISEASE 06/24/2009  . SHOULDER PAIN, BILATERAL 06/24/2009  . FATIGUE 06/24/2009  . NEPHROLITHIASIS, HX OF 06/24/2009    Deaunte Dente L 09/02/2014, 12:21 PM  [facility] [address] [place], [place], [address] Phone: [phone]   Fax:  [phone]    Geoffry Paradise, PT,DPT 09/02/2014 12:21 PM Phone: [phone] Fax: [phone]

## 2014-09-05 ENCOUNTER — Other Ambulatory Visit: Payer: Self-pay | Admitting: Cardiology

## 2014-09-09 ENCOUNTER — Telehealth: Payer: Self-pay | Admitting: Internal Medicine

## 2014-09-09 ENCOUNTER — Encounter: Payer: Self-pay | Admitting: Internal Medicine

## 2014-09-09 ENCOUNTER — Ambulatory Visit (INDEPENDENT_AMBULATORY_CARE_PROVIDER_SITE_OTHER): Payer: Medicare PPO | Admitting: Internal Medicine

## 2014-09-09 VITALS — BP 108/64 | HR 46 | Temp 97.3°F | Resp 18 | Ht 73.0 in | Wt 244.0 lb

## 2014-09-09 DIAGNOSIS — Z0000 Encounter for general adult medical examination without abnormal findings: Secondary | ICD-10-CM

## 2014-09-09 DIAGNOSIS — E1165 Type 2 diabetes mellitus with hyperglycemia: Secondary | ICD-10-CM

## 2014-09-09 DIAGNOSIS — IMO0002 Reserved for concepts with insufficient information to code with codable children: Secondary | ICD-10-CM

## 2014-09-09 DIAGNOSIS — E1151 Type 2 diabetes mellitus with diabetic peripheral angiopathy without gangrene: Secondary | ICD-10-CM

## 2014-09-09 DIAGNOSIS — E1159 Type 2 diabetes mellitus with other circulatory complications: Secondary | ICD-10-CM | POA: Diagnosis not present

## 2014-09-09 DIAGNOSIS — G4700 Insomnia, unspecified: Secondary | ICD-10-CM | POA: Diagnosis not present

## 2014-09-09 MED ORDER — CLONAZEPAM 1 MG PO TABS: 1.0000 mg | ORAL_TABLET | Freq: Every evening | ORAL | Status: DC | PRN

## 2014-09-09 MED ORDER — CLONAZEPAM 1 MG PO TABS
1.0000 mg | ORAL_TABLET | Freq: Every evening | ORAL | Status: DC | PRN
Start: 2014-09-09 — End: 2015-03-17

## 2014-09-09 NOTE — Assessment & Plan Note (Signed)
For klonopin refill 1 mg qhs prn

## 2014-09-09 NOTE — Telephone Encounter (Signed)
This was the recommendation of Dr Adele Schilder - psychiatry, I was doing what he requested from your last visit

## 2014-09-09 NOTE — Patient Instructions (Signed)
Please continue all other medications as before, and refills have been done if requested, with the klonopin changed to 1 mg at night as needed only as per psychiatry recommendation  Please have the pharmacy call with any other refills you may need.  Please continue your efforts at being more active, low cholesterol diet, and weight control.  You are otherwise up to date with prevention measures today.  Please keep your appointments with your specialists as you may have planned  Please return in 6 months, or sooner if needed, with Lab testing done 3-5 days before

## 2014-09-09 NOTE — Addendum Note (Signed)
Addended by: [name] on: 09/09/2014 10:46 AM   Modules accepted: Orders, SmartSet

## 2014-09-09 NOTE — Assessment & Plan Note (Signed)
stable overall by history and exam, recent data reviewed with pt, and pt to continue medical treatment as before,  to f/u any worsening symptoms or concerns Lab Results  Component Value Date   HGBA1C 5.6 07/14/2014

## 2014-09-09 NOTE — Telephone Encounter (Signed)
Pt called in and wanted to know why dr Jenny Reichmann cut his klonopin in 1/2   Best number [phone]

## 2014-09-09 NOTE — Assessment & Plan Note (Signed)

## 2014-09-09 NOTE — Progress Notes (Signed)
Subjective:    Patient ID: Travis Fortis Sr., male    DOB: [birth year], 64 y.o.   MRN: [id number]  HPI  Here for wellness and f/u;  Overall doing ok;  Pt denies Chest pain, worsening SOB, DOE, wheezing, orthopnea, PND, worsening LE edema, palpitations, dizziness or syncope.  Pt denies neurological change such as new headache, facial or extremity weakness.  Pt denies polydipsia, polyuria, or low sugar symptoms. Pt states overall good compliance with treatment and medications, good tolerability, and has been trying to follow appropriate diet.  Pt denies worsening depressive symptoms, suicidal ideation or panic. No fever, night sweats, wt loss, loss of appetite, or other constitutional symptoms.  Pt states good ability with ADL's, has low fall risk, home safety reviewed and adequate, no other significant changes in hearing or vision, and only occasionally active with exercise. S/p c-spine, lumbar and right foot surgury in the past yr.  Has slight tremor developed in the past yr, saw neurology but ? Related to anxiety, no tx felt needed at that time.  Psych rec's klonopin 1 mg only at night prn Past Medical History  Diagnosis Date  . Impaired glucose tolerance 09/27/2010  . HYPERLIPIDEMIA-MIXED 07/15/2009  . Chronic pain syndrome 10/27/2009  . HYPERTENSION 06/24/2009  . CORONARY ARTERY DISEASE 06/24/2009    a. s/p multiple PCIs - In 2008 he had a Taxus DES to the mild LAD, Endeavor DES to mid LCX and distal LCX. In January 2009 he had DES to distal LCX, mid LCX and proximal LCX. In November 2009 had BMS x 2 to the mid RCA. Cath 10/2011 with patent stents, noncardiac CP. LHC 01/2013: patent stents (noncardiac CP).  [name] URETHRAL STRICTURE 06/24/2009  . SHOULDER PAIN, BILATERAL 06/24/2009  . ANKLE PAIN, RIGHT 06/24/2009  . SCIATICA, LEFT 04/19/2010  . NEPHROLITHIASIS, HX OF 06/24/2009  . ERECTILE DYSFUNCTION, ORGANIC 05/30/2010  . Obesity   . ALLERGIC RHINITIS 06/24/2009  . Anxiety   . Depression     Prior suicide  attempt  . Irregular heart beat   . Pneumonia   . Self-catheterizes urinary bladder   . Anginal pain   . Type II diabetes mellitus   . Hepatitis C dx'd 1979  . DEGENERATIVE JOINT DISEASE 06/24/2009  . [place] DISEASE, LUMBAR 04/19/2010  . Arthritis     "all my joints" (09/30/2013)  . Fibromyalgia     "left leg" (09/30/2013)  . Chronic back pain     "my whole back" (09/30/2013)  . Kidney stones     "passed on my own"  . Myocardial infarction 2008  . OSA (obstructive sleep apnea)     not using CPAP (09/30/2013)  . Heart murmur    Past Surgical History  Procedure Laterality Date  . Shoulder open rotator cuff repair Right X 2  . Total knee arthroplasty Bilateral 2008  . Carpal tunnel release Bilateral   . Urethral dilation  X 4  . Knee arthroscopy Right X 7  . Umbilical hernia repair      UHR  . Hernia repair      umbilical  . Shoulder arthroscopy w/ rotator cuff repair Bilateral     "3on the left; 2 on the right"  . Tonsillectomy    . Knee cartilage surgery Right X 4    "open; not scopes"  . Knee cartilage surgery Left X 3    "open; not scopes"  . Coronary angioplasty with stent placement      "I have 9 stents"  .  Cardiac catheterization  X 1  . Anterior cervical decomp/discectomy fusion N/A 09/26/2013    Procedure: ANTERIOR CERVICAL DISCECTOMY FUSION C3-4, plate and screw fixation, allograft bone graft;  Surgeon: Jessy Oto, MD;  Location: [facility];  Service: Orthopedics;  Laterality: N/A;  . Laminectomy  01/28/2014    L3 L4 L5  . Lumbar laminectomy/decompression microdiscectomy N/A 01/27/2014    Procedure: CENTRAL LUMBAR LAMINECTOMY L4-5 AND L3-4;  Surgeon: Jessy Oto, MD;  Location: [facility];  Service: Orthopedics;  Laterality: N/A;  . Joint replacement Bilateral     knees  . Colonoscopy    . Vasectomy    . Fusion of talonavicular joint Right 04/15/2014    dr duda  . Ankle fusion Right 04/15/2014    Procedure: Right Subtalar, Talonavicular Fusion;  Surgeon: Newt Minion,  MD;  Location: [facility];  Service: Orthopedics;  Laterality: Right;  . Left heart catheterization with coronary angiogram N/A 02/10/2013    Procedure: LEFT HEART CATHETERIZATION WITH CORONARY ANGIOGRAM;  Surgeon: Burnell Blanks, MD;  Location: [facility] CATH LAB;  Service: Cardiovascular;  Laterality: N/A;    reports that he quit smoking about 4 years ago. His smoking use included Cigars. He has never used smokeless tobacco. He reports that he does not drink alcohol or use illicit drugs. family history includes Cancer in his father and mother; Coronary artery disease in his other; Depression in his brother, mother, and other; Diabetes in his father; Early death in his paternal grandfather; Early death (age of onset: 76) in his maternal grandfather; Heart disease in his father, maternal grandfather, and mother; Hyperlipidemia in his father; Hypertension in his brother, father, mother, and other. No Known Allergies Current Outpatient Prescriptions on File Prior to Visit  Medication Sig Dispense Refill  . ARIPiprazole (ABILIFY) 5 MG tablet Take 1 tablet (5 mg total) by mouth daily. 90 tablet 0  . aspirin 81 MG tablet Take 1 tablet (81 mg total) by mouth daily. 100 tablet 0  . atorvastatin (LIPITOR) 20 MG tablet Take 20 mg by mouth daily.    . benazepril (LOTENSIN) 20 MG tablet TAKE 1 TABLET BY MOUTH EVERY DAY 90 tablet 0  . clonazePAM (KLONOPIN) 2 MG tablet Take 1 tablet (2 mg total) by mouth 2 (two) times daily as needed (sleep, anxiety). 60 tablet 0  . divalproex (DEPAKOTE) 250 MG DR tablet Take 1-2 tablets (250-500 mg total) by mouth 2 (two) times daily. 250 mg in morning and 500 mg at bedtime 90 tablet 3  . docusate sodium 100 MG CAPS Take 100 mg by mouth 2 (two) times daily. 100 capsule 0  . EFFIENT 10 MG TABS tablet TAKE 1 TABLET BY MOUTH DAILY 30 tablet 1  . ferrous gluconate (FERGON) 324 MG tablet Take 1 tablet (324 mg total) by mouth 3 (three) times daily with meals. 60 tablet 3  . FLUoxetine  (PROZAC) 40 MG capsule Take 1 capsule (40 mg total) by mouth daily. 90 capsule 0  . fluticasone (FLONASE) 50 MCG/ACT nasal spray Place 1 spray into both nostrils daily.    [name] gabapentin (NEURONTIN) 400 MG capsule Take 2 capsules (800 mg total) by mouth 4 (four) times daily. 240 capsule 3  . isosorbide mononitrate (IMDUR) 60 MG 24 hr tablet Take 1 tablet (60 mg total) by mouth daily. HOLD for next 2 days    . isosorbide mononitrate (IMDUR) 60 MG 24 hr tablet TAKE 1& ONE-HALF TABLETS EVERY DAY 135 tablet 0  . metFORMIN (GLUCOPHAGE) 500  MG tablet Take 1 tablet (500 mg total) by mouth daily with breakfast. 90 tablet 3  . methocarbamol (ROBAXIN) 500 MG tablet Take 1 tablet (500 mg total) by mouth every 6 (six) hours as needed for muscle spasms. 120 tablet 3  . Omega-3 Fatty Acids (FISH OIL) 1000 MG CAPS Take 2 capsules by mouth daily.    [name] oxymorphone (OPANA) 10 MG tablet   0  . polyethylene glycol (MIRALAX / GLYCOLAX) packet Take 17 g by mouth 2 (two) times daily. (Patient taking differently: Take 17 g by mouth daily. ) 60 each 3  . prasugrel (EFFIENT) 10 MG TABS tablet Take 1 tablet (10 mg total) by mouth daily. 30 tablet 1  . sildenafil (VIAGRA) 50 MG tablet Take 1 tablet (50 mg total) by mouth daily as needed for erectile dysfunction. 10 tablet 0  . tamsulosin (FLOMAX) 0.4 MG CAPS capsule Take 0.4 mg by mouth daily after breakfast.    . traZODone (DESYREL) 100 MG tablet Take 1 tablet (100 mg total) by mouth at bedtime. 90 tablet 0   No current facility-administered medications on file prior to visit.    Review of Systems Constitutional: Negative for increased diaphoresis, other activity, appetite or siginficant weight change other than noted HENT: Negative for worsening hearing loss, ear pain, facial swelling, mouth sores and neck stiffness.   Eyes: Negative for other worsening pain, redness or visual disturbance.  Respiratory: Negative for shortness of breath and wheezing  Cardiovascular:  Negative for chest pain and palpitations.  Gastrointestinal: Negative for diarrhea, blood in stool, abdominal distention or other pain Genitourinary: Negative for hematuria, flank pain or change in urine volume.  Musculoskeletal: Negative for myalgias or other joint complaints.  Skin: Negative for color change and wound or drainage.  Neurological: Negative for syncope and numbness. other than noted Hematological: Negative for adenopathy. or other swelling Psychiatric/Behavioral: Negative for hallucinations, SI, self-injury, decreased concentration or other worsening agitation.      Objective:   Physical Exam BP 108/64 mmHg  Pulse 46  Temp(Src) 97.3 F (36.3 C) (Oral)  Resp 18  Ht 6' 1"  (1.854 m)  Wt 244 lb (110.678 kg)  BMI 32.20 kg/m2  SpO2 97% BP Readings from Last 3 Encounters:  09/09/14 108/64  08/18/14 83/49  07/29/14 96/60   Wt Readings from Last 3 Encounters:  09/09/14 244 lb (110.678 kg)  07/29/14 236 lb 3.2 oz (107.14 kg)  07/14/14 236 lb 1.3 oz (107.085 kg)  VS noted,  Constitutional: Pt is oriented to person, place, and time. Appears well-developed and well-nourished, in no significant distress Head: Normocephalic and atraumatic.  Right Ear: External ear normal.  Left Ear: External ear normal.  Nose: Nose normal.  Mouth/Throat: Oropharynx is clear and moist.  Eyes: Conjunctivae and EOM are normal. Pupils are equal, round, and reactive to light.  Neck: Normal range of motion. Neck supple. No JVD present. No tracheal deviation present or significant neck LA or mass Cardiovascular: Normal rate, regular rhythm, normal heart sounds and intact distal pulses.   Pulmonary/Chest: Effort normal and breath sounds without rales or wheezing  Abdominal: Soft. Bowel sounds are normal. NT. No HSM  Musculoskeletal: Normal range of motion. Exhibits no edema.  Lymphadenopathy:  Has no cervical adenopathy.  Neurological: Pt is alert and oriented to person, place, and time. Pt has  normal reflexes. No cranial nerve deficit. Motor grossly intact Skin: Skin is warm and dry. No rash noted.  Psychiatric:  Has normal mood and affect. Behavior is normal.  Lab Results  Component Value Date   WBC 4.7 07/14/2014   HGB 11.1* 07/14/2014   HCT 33.0* 07/14/2014   PLT 224.0 07/14/2014   GLUCOSE 104* 07/14/2014   CHOL 108 07/14/2014   TRIG 75.0 07/14/2014   HDL 36.30* 07/14/2014   LDLCALC 57 07/14/2014   ALT 8 07/14/2014   AST 14 07/14/2014   NA 141 07/14/2014   K 4.2 07/14/2014   CL 105 07/14/2014   CREATININE 0.89 07/14/2014   BUN 26* 07/14/2014   CO2 31 07/14/2014   TSH 0.91 07/14/2014   PSA 0.49 04/03/2013   INR 1.18 04/15/2014   HGBA1C 5.6 07/14/2014       Assessment & Plan:

## 2014-09-09 NOTE — Progress Notes (Signed)
Pre visit review using our clinic review tool, if applicable. No additional management support is needed unless otherwise documented below in the visit note. 

## 2014-09-10 ENCOUNTER — Telehealth: Payer: Self-pay | Admitting: Internal Medicine

## 2014-09-11 NOTE — Telephone Encounter (Signed)
Pt informed below

## 2014-09-15 ENCOUNTER — Ambulatory Visit: Payer: Medicare PPO

## 2014-09-16 ENCOUNTER — Ambulatory Visit: Payer: Medicare PPO | Admitting: Physical Therapy

## 2014-09-22 ENCOUNTER — Ambulatory Visit: Payer: Medicare PPO | Attending: Specialist

## 2014-09-22 DIAGNOSIS — R262 Difficulty in walking, not elsewhere classified: Secondary | ICD-10-CM | POA: Diagnosis not present

## 2014-09-22 DIAGNOSIS — R293 Abnormal posture: Secondary | ICD-10-CM | POA: Diagnosis not present

## 2014-09-22 DIAGNOSIS — M4806 Spinal stenosis, lumbar region: Secondary | ICD-10-CM | POA: Diagnosis not present

## 2014-09-22 DIAGNOSIS — M6281 Muscle weakness (generalized): Secondary | ICD-10-CM

## 2014-09-22 DIAGNOSIS — R269 Unspecified abnormalities of gait and mobility: Secondary | ICD-10-CM

## 2014-09-22 NOTE — Therapy (Signed)
[facility] [address] [place], [place], [address] Phone: [phone]   Fax:  [phone]  Physical Therapy Treatment  Patient Details  Name: Travis [name]. MRN: [id number] Date of Birth: Mar 19, 1951 Referring Provider:  [name], MD  Encounter Date: 09/22/2014      PT End of Session - 09/22/14 1323    Visit Number 8   Number of Visits 17   Date for PT Re-Evaluation 10/03/14   Authorization Type [employer], g-code every 10th visit.   PT Start Time 0802   PT Stop Time [phone]   PT Time Calculation (min) 41 min   Equipment Utilized During Treatment Gait belt   Activity Tolerance Patient tolerated treatment well   Behavior During Therapy WFL for tasks assessed/performed      Past Medical History  Diagnosis Date  . Impaired glucose tolerance 09/27/2010  . HYPERLIPIDEMIA-MIXED 07/15/2009  . Chronic pain syndrome 10/27/2009  . HYPERTENSION 06/24/2009  . CORONARY ARTERY DISEASE 06/24/2009    a. s/p multiple PCIs - In 2008 he had a Taxus DES to the mild LAD, Endeavor DES to mid LCX and distal LCX. In January 2009 he had DES to distal LCX, mid LCX and proximal LCX. In November 2009 had BMS x 2 to the mid RCA. Cath 10/2011 with patent stents, noncardiac CP. LHC 01/2013: patent stents (noncardiac CP).  [name] URETHRAL STRICTURE 06/24/2009  . SHOULDER PAIN, BILATERAL 06/24/2009  . ANKLE PAIN, RIGHT 06/24/2009  . SCIATICA, LEFT 04/19/2010  . NEPHROLITHIASIS, HX OF 06/24/2009  . ERECTILE DYSFUNCTION, ORGANIC 05/30/2010  . Obesity   . ALLERGIC RHINITIS 06/24/2009  . Anxiety   . Depression     Prior suicide attempt  . Irregular heart beat   . Pneumonia   . Self-catheterizes urinary bladder   . Anginal pain   . Type II diabetes mellitus   . Hepatitis C dx'd 1979  . DEGENERATIVE JOINT DISEASE 06/24/2009  . [place] DISEASE, LUMBAR 04/19/2010  . Arthritis     "all my joints" (09/30/2013)  . Fibromyalgia     "left leg" (09/30/2013)  . Chronic back  pain     "my whole back" (09/30/2013)  . Kidney stones     "passed on my own"  . Myocardial infarction 2008  . OSA (obstructive sleep apnea)     not using CPAP (09/30/2013)  . Heart murmur     Past Surgical History  Procedure Laterality Date  . Shoulder open rotator cuff repair Right X 2  . Total knee arthroplasty Bilateral 2008  . Carpal tunnel release Bilateral   . Urethral dilation  X 4  . Knee arthroscopy Right X 7  . Umbilical hernia repair      UHR  . Hernia repair      umbilical  . Shoulder arthroscopy w/ rotator cuff repair Bilateral     "3on the left; 2 on the right"  . Tonsillectomy    . Knee cartilage surgery Right X 4    "open; not scopes"  . Knee cartilage surgery Left X 3    "open; not scopes"  . Coronary angioplasty with stent placement      "I have 9 stents"  . Cardiac catheterization  X 1  . Anterior cervical decomp/discectomy fusion N/A 09/26/2013    Procedure: ANTERIOR CERVICAL DISCECTOMY FUSION C3-4, plate and screw fixation, allograft bone graft;  Surgeon: Jessy Oto, MD;  Location: [facility];  Service: Orthopedics;  Laterality: N/A;  . Laminectomy  01/28/2014    L3 L4 L5  . Lumbar laminectomy/decompression microdiscectomy N/A 01/27/2014    Procedure: CENTRAL LUMBAR LAMINECTOMY L4-5 AND L3-4;  Surgeon: Jessy Oto, MD;  Location: [facility];  Service: Orthopedics;  Laterality: N/A;  . Joint replacement Bilateral     knees  . Colonoscopy    . Vasectomy    . Fusion of talonavicular joint Right 04/15/2014    dr duda  . Ankle fusion Right 04/15/2014    Procedure: Right Subtalar, Talonavicular Fusion;  Surgeon: Newt Minion, MD;  Location: [facility];  Service: Orthopedics;  Laterality: Right;  . Left heart catheterization with coronary angiogram N/A 02/10/2013    Procedure: LEFT HEART CATHETERIZATION WITH CORONARY ANGIOGRAM;  Surgeon: Burnell Blanks, MD;  Location: [facility] CATH LAB;  Service: Cardiovascular;  Laterality: N/A;    There were no vitals filed for  this visit.  Visit Diagnosis:  Abnormality of gait  Muscle weakness      Subjective Assessment - 09/22/14 0804    Subjective Pt denied falls or changes since last visit. Pt has not tried to walk the hill by his house yet. Pt is still performing HEP as prescribed. Pt reported he can lift R foot onto a step and pt ambulated to PT without bariatric SPC and reported he doesn't feel he needs a SPC. Pt reported he missed the last few weeks of PT, as he did not have a ride.   Pertinent History 3 Cervical and lumbar surgeries over last 9 months, R ankle fusion 03/2014, diabetes, hx of MI and 9 heart stents, R rotator cuff surgery 4 years ago with limited R shoulder AROM   Patient Stated Goals Be able to walk without a cane, get back to walking 6-7 miles per day   Currently in Pain? No/denies                         [facility] Adult PT Treatment/Exercise - 09/22/14 0806    Ambulation/Gait   Ambulation/Gait Yes   Ambulation/Gait Assistance 5: Supervision   Ambulation/Gait Assistance Details Pt ambulated while performing head turns, and missed 3 playing cards. Cues to decrease R lateral trunk lean and heel strike. Pt reported 4/10 R foot pain after ambulating 460', pt required seated rest break to decrease pain to 2/10. R lat. trunk lean decreased with use of SPC.   Ambulation Distance (Feet) --  125' without SPC, 460'x2 with SPC   Assistive device None   Gait Pattern Step-through pattern;Decreased stride length;Trendelenburg;Lateral trunk lean to right;Antalgic;Decreased step length - left   Ambulation Surface Level;Indoor   Stairs Yes   Stairs Assistance 5: Supervision   Stairs Assistance Details (indicate cue type and reason) Pt demonstrated safe technique.   Stair Management Technique One rail Right;Alternating pattern   Number of Stairs 4   Height of Stairs 6   Ramp 5: Supervision;Other (comment)  min guard   Ramp Details (indicate cue type and reason) x4. VC's for weight  shifting and min guard to ensure safety. performed with and without SPC, pt noted to demonstrate safe technique with SPC.   Dynamic Standing Balance   Dynamic Standing - Balance Support Left upper extremity supported;No upper extremity supported   Dynamic Standing - Level of Assistance 5: Stand by assistance;4: Min assist   Dynamic Standing - Balance Activities Alternating  foot traps   Dynamic Standing - Comments L UE support on SPC and then without UE support: 2x5 cone single taps/LE.  VC's to improve lateral weight shifting and min A required when pt not using SPC for support.    High Level Balance   High Level Balance Activities Other (comment);Side stepping;Backward walking;Turns;Head turns;Marching forwards;Figure 8 turns  forward amb. with eyes closed   High Level Balance Comments Performed on compliant surface with min guard to min A (during 3 LOB episodes). 4x7'/activity. VC's to improve upright posture, R heel strike, and narrow BOS to improve weight shifting.                PT Education - 09/22/14 1323    Education provided Yes   Education Details PT reiterated the importance of using SPC during ambulation. PT provided pt with [facility] prescription from MD and HEP from last visit, which pt left here.   Person(s) Educated Patient   Methods Explanation   Comprehension Verbalized understanding          PT Short Term Goals - 09/02/14 1220    PT SHORT TERM GOAL #1   Title Pt will be independent in HEP to improve functional mobilty. Target date 09/01/14.   Status Achieved   PT SHORT TERM GOAL #2   Title Pt will improve BERG score to >/=39/56 to decrease falls risk. Target date 09/01/14.   Status Achieved   PT SHORT TERM GOAL #3   Title Pt will report zero falls over the last 4 weeks to improve safety during functional mobililty. Target date 09/01/14.   Status Achieved   PT SHORT TERM GOAL #4   Title Pt will ambulate 300' over even terrain with LRAD and supervision to improve  functional mobility. Target date 09/01/14.   Status Achieved           PT Long Term Goals - 09/01/14 1133    PT LONG TERM GOAL #1   Title Pt will verbalize understanding of fall prevention strategies to reduce falls risk. Target date 09/29/14.   Status On-going   PT LONG TERM GOAL #2   Title Pt will improve BERG score to 47/56 to decrease falls risk. Target date 09/29/14.   Baseline Revised from 43 to 47, as pt met LTG on 09/01/14.   Status Revised   PT LONG TERM GOAL #3   Title Pt will improve FOTO score by 10 points to improve quality of life. Target date 09/29/14.   Status On-going   PT LONG TERM GOAL #4   Title Pt will perform TUG in </=13.5 seconds with LRAD to decrease falls risk. Target date 09/29/14.   Status On-going   PT LONG TERM GOAL #5   Title Pt will improve gait speed with LRAD to >/=1.8 ft/sec. to ambulate without risk for recurrent falls. Target date 09/29/14.   Status On-going   PT LONG TERM GOAL #6   Title Pt will ambulate 600' with LRAD over even/uneven terrain at MOD I level to improve functional mobility. Target date 09/29/14.   Status On-going               Plan - 09/22/14 1324    Clinical Impression Statement Pt demonstrated progress, as he was able to perform high level balance activities over compliant surfaces. Pt continues to require min A during single leg activities to maintain balance. Pt is able to ambulate without SPC with no overt LOB episodes, but R lateral trunk lean is increased. Continue with POC.   Pt will benefit from skilled therapeutic intervention in order to improve on the following deficits Abnormal gait;Decreased endurance;Obesity;Decreased knowledge  of use of DME;Decreased balance;Decreased strength;Decreased mobility;Pain   Rehab Potential Good   PT Frequency 2x / week   PT Duration 8 weeks   PT Treatment/Interventions ADLs/Self Care Home Management;Gait training;Neuromuscular re-education;Stair  training;Ultrasound;Biofeedback;Functional mobility training;Patient/family education;Cryotherapy;Therapeutic activities;Electrical Stimulation;Therapeutic exercise;Manual techniques;DME Instruction;Balance training   PT Next Visit Plan Ambulation with SPC: Ramps and hills (outdoors) to mimic pt traversing hill at home. High level balance activites over compliant surfaces and SLS activities.   PT Home Exercise Plan Balance and strengthening HEP   Consulted and Agree with Plan of Care Patient        Problem List Patient Active Problem List   Diagnosis Date Noted  . General weakness 07/14/2014  . Urinary incontinence 07/14/2014  . Arthritis of foot, right, degenerative 04/15/2014  . Postoperative anemia due to acute blood loss 01/30/2014    Class: Acute  . Hypotension, unspecified 01/30/2014    Class: Acute  . Spinal stenosis, lumbar 01/27/2014  . Cellulitis of leg, right 10/15/2013  . Headache(784.0) 10/15/2013  . Uncontrolled type 2 DM with peripheral circulatory disorder 10/04/2013  . Acute posthemorrhagic anemia 10/04/2013  . Acute urinary retention 09/30/2013    Class: Acute  . Spinal stenosis in cervical region 09/26/2013    Class: Chronic  . Spinal stenosis, lumbar region, with neurogenic claudication 09/26/2013    Class: Chronic  . Cervical stenosis of spinal canal 09/26/2013  . Neck pain 09/02/2013  . Hand joint pain 06/10/2013  . Rotator cuff tear arthropathy of right shoulder 06/10/2013  . Skin lesion of cheek 05/01/2013  . Pain of right thumb 04/03/2013  . Balance disorder 03/12/2013  . Gait disorder 03/12/2013  . Tremor 03/12/2013  . Left hip pain 03/12/2013  . Low back pain 03/12/2013  . Pre-ulcerative corn or callous 02/06/2013  . Polypharmacy 06/25/2012    Class: Chronic  . Balance problems 12/25/2011  . Anxiety 11/12/2011  . OSA (obstructive sleep apnea) 11/07/2011  . Bradycardia 10/20/2011  . Insomnia 10/04/2011  . Lumbar degenerative disc disease  07/11/2011  . Obesity 01/12/2011  . Preventative health care 09/27/2010  . ERECTILE DYSFUNCTION, ORGANIC 05/30/2010  . [place] DISEASE, LUMBAR 04/19/2010  . SCIATICA, LEFT 04/19/2010  . Chronic pain syndrome 10/27/2009  . Hyperlipidemia 07/15/2009  . Depression 06/24/2009  . Essential hypertension 06/24/2009  . CAD (coronary artery disease) 06/24/2009  . Allergic rhinitis 06/24/2009  . URETHRAL STRICTURE 06/24/2009  . DEGENERATIVE JOINT DISEASE 06/24/2009  . SHOULDER PAIN, BILATERAL 06/24/2009  . FATIGUE 06/24/2009  . NEPHROLITHIASIS, HX OF 06/24/2009    Jihan Rudy L 09/22/2014, 1:26 PM  [facility] [address] [place], [place], [address] Phone: [phone]   Fax:  [phone]     Geoffry Paradise, PT,DPT 09/22/2014 1:26 PM Phone: [phone] Fax: [phone]

## 2014-09-23 ENCOUNTER — Ambulatory Visit: Payer: Medicare PPO | Admitting: Physical Therapy

## 2014-09-23 ENCOUNTER — Ambulatory Visit (INDEPENDENT_AMBULATORY_CARE_PROVIDER_SITE_OTHER): Payer: Medicare PPO | Admitting: Psychology

## 2014-09-23 DIAGNOSIS — M4806 Spinal stenosis, lumbar region: Secondary | ICD-10-CM | POA: Diagnosis not present

## 2014-09-23 DIAGNOSIS — F332 Major depressive disorder, recurrent severe without psychotic features: Secondary | ICD-10-CM

## 2014-09-23 DIAGNOSIS — M6281 Muscle weakness (generalized): Secondary | ICD-10-CM

## 2014-09-23 DIAGNOSIS — R269 Unspecified abnormalities of gait and mobility: Secondary | ICD-10-CM

## 2014-09-23 NOTE — Therapy (Signed)
[facility] [address] [place], [place], [address] Phone: [phone]   Fax:  [phone]  Physical Therapy Treatment  Patient Details  Name: Travis Casher Sr. MRN: [id number] Date of Birth: 05-Sep-1950 Referring Provider:  [name], MD  Encounter Date: 09/23/2014      PT End of Session - 09/23/14 0833    Visit Number 9   Number of Visits 17   Date for PT Re-Evaluation 10/03/14   Authorization Type [employer], g-code every 10th visit.   PT Start Time 0806   PT Stop Time 0845   PT Time Calculation (min) 39 min   Equipment Utilized During Treatment Gait belt   Activity Tolerance Patient tolerated treatment well   Behavior During Therapy WFL for tasks assessed/performed      Past Medical History  Diagnosis Date  . Impaired glucose tolerance 09/27/2010  . HYPERLIPIDEMIA-MIXED 07/15/2009  . Chronic pain syndrome 10/27/2009  . HYPERTENSION 06/24/2009  . CORONARY ARTERY DISEASE 06/24/2009    a. s/p multiple PCIs - In 2008 he had a Taxus DES to the mild LAD, Endeavor DES to mid LCX and distal LCX. In January 2009 he had DES to distal LCX, mid LCX and proximal LCX. In November 2009 had BMS x 2 to the mid RCA. Cath 10/2011 with patent stents, noncardiac CP. LHC 01/2013: patent stents (noncardiac CP).  [name] URETHRAL STRICTURE 06/24/2009  . SHOULDER PAIN, BILATERAL 06/24/2009  . ANKLE PAIN, RIGHT 06/24/2009  . SCIATICA, LEFT 04/19/2010  . NEPHROLITHIASIS, HX OF 06/24/2009  . ERECTILE DYSFUNCTION, ORGANIC 05/30/2010  . Obesity   . ALLERGIC RHINITIS 06/24/2009  . Anxiety   . Depression     Prior suicide attempt  . Irregular heart beat   . Pneumonia   . Self-catheterizes urinary bladder   . Anginal pain   . Type II diabetes mellitus   . Hepatitis C dx'd 1979  . DEGENERATIVE JOINT DISEASE 06/24/2009  . [employer] DISEASE, LUMBAR 04/19/2010  . Arthritis     "all my joints" (09/30/2013)  . Fibromyalgia     "left leg" (09/30/2013)  . Chronic back  pain     "my whole back" (09/30/2013)  . Kidney stones     "passed on my own"  . Myocardial infarction 2008  . OSA (obstructive sleep apnea)     not using CPAP (09/30/2013)  . Heart murmur     Past Surgical History  Procedure Laterality Date  . Shoulder open rotator cuff repair Right X 2  . Total knee arthroplasty Bilateral 2008  . Carpal tunnel release Bilateral   . Urethral dilation  X 4  . Knee arthroscopy Right X 7  . Umbilical hernia repair      UHR  . Hernia repair      umbilical  . Shoulder arthroscopy w/ rotator cuff repair Bilateral     "3on the left; 2 on the right"  . Tonsillectomy    . Knee cartilage surgery Right X 4    "open; not scopes"  . Knee cartilage surgery Left X 3    "open; not scopes"  . Coronary angioplasty with stent placement      "I have 9 stents"  . Cardiac catheterization  X 1  . Anterior cervical decomp/discectomy fusion N/A 09/26/2013    Procedure: ANTERIOR CERVICAL DISCECTOMY FUSION C3-4, plate and screw fixation, allograft bone graft;  Surgeon: Jessy Oto, MD;  Location: [facility];  Service: Orthopedics;  Laterality: N/A;  . Laminectomy  01/28/2014    L3 L4 L5  . Lumbar laminectomy/decompression microdiscectomy N/A 01/27/2014    Procedure: CENTRAL LUMBAR LAMINECTOMY L4-5 AND L3-4;  Surgeon: Jessy Oto, MD;  Location: [facility];  Service: Orthopedics;  Laterality: N/A;  . Joint replacement Bilateral     knees  . Colonoscopy    . Vasectomy    . Fusion of talonavicular joint Right 04/15/2014    dr duda  . Ankle fusion Right 04/15/2014    Procedure: Right Subtalar, Talonavicular Fusion;  Surgeon: Newt Minion, MD;  Location: [facility];  Service: Orthopedics;  Laterality: Right;  . Left heart catheterization with coronary angiogram N/A 02/10/2013    Procedure: LEFT HEART CATHETERIZATION WITH CORONARY ANGIOGRAM;  Surgeon: Burnell Blanks, MD;  Location: [facility] CATH LAB;  Service: Cardiovascular;  Laterality: N/A;    There were no vitals filed for  this visit.  Visit Diagnosis:  Abnormality of gait  Muscle weakness      Subjective Assessment - 09/23/14 0810    Subjective "We are going to have to take it easy today.  My back is killing me."   Pertinent History 3 Cervical and lumbar surgeries over last 9 months, R ankle fusion 03/2014, diabetes, hx of MI and 9 heart stents, R rotator cuff surgery 4 years ago with limited R shoulder AROM   Patient Stated Goals Be able to walk without a cane, get back to walking 6-7 miles per day   Currently in Pain? Yes   Pain Score 7    Pain Location Back   Pain Orientation Lower   Pain Descriptors / Indicators Sharp   Pain Type Chronic pain   Pain Onset More than a month ago   Aggravating Factors  standing   Pain Relieving Factors meds   Multiple Pain Sites No                         [facility] Adult PT Treatment/Exercise - 09/23/14 1126    Transfers   Transfers Sit to Stand;Stand to Sit   Sit to Stand 5: Supervision;With upper extremity assist;With armrests;From chair/3-in-1   Stand to Sit 5: Supervision;With upper extremity assist;With armrests;To chair/3-in-1   Ambulation/Gait   Ambulation/Gait Yes   Ambulation/Gait Assistance 5: Supervision   Ambulation/Gait Assistance Details Pt ambulated on even/uneven concrete and pavement   Ambulation Distance (Feet) 800 Feet  outdoors and >400 indoors   Assistive device Straight cane   Gait Pattern Step-through pattern;Decreased stride length;Trendelenburg;Lateral trunk lean to right;Antalgic;Decreased step length - left   Ambulation Surface Level;Unlevel;Indoor;Outdoor;Paved   Knee/Hip Exercises: Standing   Lateral Step Up Both;10 reps;Hand Hold: 2;Step Height: 6"   Forward Step Up Both;10 reps;Hand Hold: 2;Step Height: 6"   Other Standing Knee Exercises standing at counter for hip abduction, hip extension, marching, hamstring curls x 10 bil LE's   Knee/Hip Exercises: Seated   Other Seated Knee Exercises Bil hamstring stretch edge  of mat x 30 seconds x 2                PT Education - 09/22/14 1323    Education provided Yes   Education Details PT reiterated the importance of using SPC during ambulation. PT provided pt with [facility] prescription from MD and HEP from last visit, which pt left here.   Person(s) Educated Patient   Methods Explanation   Comprehension Verbalized understanding          PT Short Term Goals - 09/02/14 1220  PT SHORT TERM GOAL #1   Title Pt will be independent in HEP to improve functional mobilty. Target date 09/01/14.   Status Achieved   PT SHORT TERM GOAL #2   Title Pt will improve BERG score to >/=39/56 to decrease falls risk. Target date 09/01/14.   Status Achieved   PT SHORT TERM GOAL #3   Title Pt will report zero falls over the last 4 weeks to improve safety during functional mobililty. Target date 09/01/14.   Status Achieved   PT SHORT TERM GOAL #4   Title Pt will ambulate 300' over even terrain with LRAD and supervision to improve functional mobility. Target date 09/01/14.   Status Achieved           PT Long Term Goals - 09/01/14 1133    PT LONG TERM GOAL #1   Title Pt will verbalize understanding of fall prevention strategies to reduce falls risk. Target date 09/29/14.   Status On-going   PT LONG TERM GOAL #2   Title Pt will improve BERG score to 47/56 to decrease falls risk. Target date 09/29/14.   Baseline Revised from 43 to 47, as pt met LTG on 09/01/14.   Status Revised   PT LONG TERM GOAL #3   Title Pt will improve FOTO score by 10 points to improve quality of life. Target date 09/29/14.   Status On-going   PT LONG TERM GOAL #4   Title Pt will perform TUG in </=13.5 seconds with LRAD to decrease falls risk. Target date 09/29/14.   Status On-going   PT LONG TERM GOAL #5   Title Pt will improve gait speed with LRAD to >/=1.8 ft/sec. to ambulate without risk for recurrent falls. Target date 09/29/14.   Status On-going   PT LONG TERM GOAL #6   Title Pt will  ambulate 600' with LRAD over even/uneven terrain at MOD I level to improve functional mobility. Target date 09/29/14.   Status On-going               Plan - 09/23/14 1252    Clinical Impression Statement Pt thinks increased back pain related to walking yesterday without cane while running errands all day.  Pt continues with L hip weakness and R lateral trunk lean.  Continue PT per POC.   Pt will benefit from skilled therapeutic intervention in order to improve on the following deficits Abnormal gait;Decreased endurance;Obesity;Decreased knowledge of use of DME;Decreased balance;Decreased strength;Decreased mobility;Pain   Rehab Potential Good   PT Frequency 2x / week   PT Duration 8 weeks   PT Treatment/Interventions ADLs/Self Care Home Management;Gait training;Neuromuscular re-education;Stair training;Ultrasound;Biofeedback;Functional mobility training;Patient/family education;Cryotherapy;Therapeutic activities;Electrical Stimulation;Therapeutic exercise;Manual techniques;DME Instruction;Balance training   PT Next Visit Plan LE strengthening   Consulted and Agree with Plan of Care Patient        Problem List Patient Active Problem List   Diagnosis Date Noted  . General weakness 07/14/2014  . Urinary incontinence 07/14/2014  . Arthritis of foot, right, degenerative 04/15/2014  . Postoperative anemia due to acute blood loss 01/30/2014    Class: Acute  . Hypotension, unspecified 01/30/2014    Class: Acute  . Spinal stenosis, lumbar 01/27/2014  . Cellulitis of leg, right 10/15/2013  . Headache(784.0) 10/15/2013  . Uncontrolled type 2 DM with peripheral circulatory disorder 10/04/2013  . Acute posthemorrhagic anemia 10/04/2013  . Acute urinary retention 09/30/2013    Class: Acute  . Spinal stenosis in cervical region 09/26/2013    Class: Chronic  . Spinal stenosis,  lumbar region, with neurogenic claudication 09/26/2013    Class: Chronic  . Cervical stenosis of spinal canal  09/26/2013  . Neck pain 09/02/2013  . Hand joint pain 06/10/2013  . Rotator cuff tear arthropathy of right shoulder 06/10/2013  . Skin lesion of cheek 05/01/2013  . Pain of right thumb 04/03/2013  . Balance disorder 03/12/2013  . Gait disorder 03/12/2013  . Tremor 03/12/2013  . Left hip pain 03/12/2013  . Low back pain 03/12/2013  . Pre-ulcerative corn or callous 02/06/2013  . Polypharmacy 06/25/2012    Class: Chronic  . Balance problems 12/25/2011  . Anxiety 11/12/2011  . OSA (obstructive sleep apnea) 11/07/2011  . Bradycardia 10/20/2011  . Insomnia 10/04/2011  . Lumbar degenerative disc disease 07/11/2011  . Obesity 01/12/2011  . Preventative health care 09/27/2010  . ERECTILE DYSFUNCTION, ORGANIC 05/30/2010  . [place] DISEASE, LUMBAR 04/19/2010  . SCIATICA, LEFT 04/19/2010  . Chronic pain syndrome 10/27/2009  . Hyperlipidemia 07/15/2009  . Depression 06/24/2009  . Essential hypertension 06/24/2009  . CAD (coronary artery disease) 06/24/2009  . Allergic rhinitis 06/24/2009  . URETHRAL STRICTURE 06/24/2009  . DEGENERATIVE JOINT DISEASE 06/24/2009  . SHOULDER PAIN, BILATERAL 06/24/2009  . FATIGUE 06/24/2009  . NEPHROLITHIASIS, HX OF 06/24/2009    Narda Bonds 09/23/2014, 12:54 PM  [facility] [address] [place], [place], [address] Phone: [phone]   Fax:  [place], [place] 09/23/2014 12:55 PM Phone: [phone] Fax: [phone]

## 2014-09-29 ENCOUNTER — Ambulatory Visit: Payer: Medicare PPO

## 2014-09-29 DIAGNOSIS — R269 Unspecified abnormalities of gait and mobility: Secondary | ICD-10-CM

## 2014-09-29 DIAGNOSIS — M4806 Spinal stenosis, lumbar region: Secondary | ICD-10-CM | POA: Diagnosis not present

## 2014-09-29 DIAGNOSIS — M6281 Muscle weakness (generalized): Secondary | ICD-10-CM

## 2014-09-29 NOTE — Therapy (Signed)
[facility] [address] [place], [place], [address] Phone: [phone]   Fax:  [phone]  Physical Therapy Treatment  Patient Details  Name: Travis Reinoso Sr. MRN: [id number] Date of Birth: 1951/02/12 Referring Provider:  [name], MD  Encounter Date: 09/29/2014      PT End of Session - 09/29/14 1246    Visit Number 10   Number of Visits 17   Date for PT Re-Evaluation 10/03/14   Authorization Type [employer], g-code every 10th visit.   PT Start Time 0801   PT Stop Time 0839   PT Time Calculation (min) 38 min   Equipment Utilized During Treatment Gait belt   Activity Tolerance Patient tolerated treatment well   Behavior During Therapy WFL for tasks assessed/performed      Past Medical History  Diagnosis Date  . Impaired glucose tolerance 09/27/2010  . HYPERLIPIDEMIA-MIXED 07/15/2009  . Chronic pain syndrome 10/27/2009  . HYPERTENSION 06/24/2009  . CORONARY ARTERY DISEASE 06/24/2009    a. s/p multiple PCIs - In 2008 he had a Taxus DES to the mild LAD, Endeavor DES to mid LCX and distal LCX. In January 2009 he had DES to distal LCX, mid LCX and proximal LCX. In November 2009 had BMS x 2 to the mid RCA. Cath 10/2011 with patent stents, noncardiac CP. LHC 01/2013: patent stents (noncardiac CP).  [name] URETHRAL STRICTURE 06/24/2009  . SHOULDER PAIN, BILATERAL 06/24/2009  . ANKLE PAIN, RIGHT 06/24/2009  . SCIATICA, LEFT 04/19/2010  . NEPHROLITHIASIS, HX OF 06/24/2009  . ERECTILE DYSFUNCTION, ORGANIC 05/30/2010  . Obesity   . ALLERGIC RHINITIS 06/24/2009  . Anxiety   . Depression     Prior suicide attempt  . Irregular heart beat   . Pneumonia   . Self-catheterizes urinary bladder   . Anginal pain   . Type II diabetes mellitus   . Hepatitis C dx'd 1979  . DEGENERATIVE JOINT DISEASE 06/24/2009  . [place] DISEASE, LUMBAR 04/19/2010  . Arthritis     "all my joints" (09/30/2013)  . Fibromyalgia     "left leg" (09/30/2013)  . Chronic  back pain     "my whole back" (09/30/2013)  . Kidney stones     "passed on my own"  . Myocardial infarction 2008  . OSA (obstructive sleep apnea)     not using CPAP (09/30/2013)  . Heart murmur     Past Surgical History  Procedure Laterality Date  . Shoulder open rotator cuff repair Right X 2  . Total knee arthroplasty Bilateral 2008  . Carpal tunnel release Bilateral   . Urethral dilation  X 4  . Knee arthroscopy Right X 7  . Umbilical hernia repair      UHR  . Hernia repair      umbilical  . Shoulder arthroscopy w/ rotator cuff repair Bilateral     "3on the left; 2 on the right"  . Tonsillectomy    . Knee cartilage surgery Right X 4    "open; not scopes"  . Knee cartilage surgery Left X 3    "open; not scopes"  . Coronary angioplasty with stent placement      "I have 9 stents"  . Cardiac catheterization  X 1  . Anterior cervical decomp/discectomy fusion N/A 09/26/2013    Procedure: ANTERIOR CERVICAL DISCECTOMY FUSION C3-4, plate and screw fixation, allograft bone graft;  Surgeon: Jessy Oto, MD;  Location: [facility];  Service: Orthopedics;  Laterality: N/A;  . Laminectomy  01/28/2014    L3 L4 L5  . Lumbar laminectomy/decompression microdiscectomy N/A 01/27/2014    Procedure: CENTRAL LUMBAR LAMINECTOMY L4-5 AND L3-4;  Surgeon: Jessy Oto, MD;  Location: [facility];  Service: Orthopedics;  Laterality: N/A;  . Joint replacement Bilateral     knees  . Colonoscopy    . Vasectomy    . Fusion of talonavicular joint Right 04/15/2014    dr duda  . Ankle fusion Right 04/15/2014    Procedure: Right Subtalar, Talonavicular Fusion;  Surgeon: Newt Minion, MD;  Location: [facility];  Service: Orthopedics;  Laterality: Right;  . Left heart catheterization with coronary angiogram N/A 02/10/2013    Procedure: LEFT HEART CATHETERIZATION WITH CORONARY ANGIOGRAM;  Surgeon: Burnell Blanks, MD;  Location: [facility] CATH LAB;  Service: Cardiovascular;  Laterality: N/A;    There were no vitals filed  for this visit.  Visit Diagnosis:  Abnormality of gait  Muscle weakness      Subjective Assessment - 09/29/14 0805    Subjective Pt reported he feels that balance and strength is much better. Pt did state he fell this weekend while walking his dog, his dog pulled him down the hill when the dog saw a cat. Pt fell but denied hitting head or having pain. Pt has an appointment with [employer] to purchase standard SPC this week.   Pertinent History 3 Cervical and lumbar surgeries over last 9 months, R ankle fusion 03/2014, diabetes, hx of MI and 9 heart stents, R rotator cuff surgery 4 years ago with limited R shoulder AROM   Patient Stated Goals Be able to walk without a cane, get back to walking 6-7 miles per day   Currently in Pain? No/denies                         [facility] Adult PT Treatment/Exercise - 09/29/14 0808    Ambulation/Gait   Ambulation/Gait Yes   Ambulation/Gait Assistance 6: Modified independent (Device/Increase time)   Ambulation/Gait Assistance Details No overt LOB episodes during ambulation.   Ambulation Distance (Feet) --  650', 75'X2   Assistive device Straight cane   Gait Pattern Step-through pattern;Decreased stride length;Trendelenburg;Lateral trunk lean to right;Antalgic;Decreased step length - left  less lateral trunk lean noted with SPC   Ambulation Surface Level;Unlevel;Indoor;Outdoor;Paved   Gait velocity 3.40f/sec.   Standardized Balance Assessment   Standardized Balance Assessment Berg Balance Test;Timed Up and Go Test   Berg Balance Test   Sit to Stand Able to stand without using hands and stabilize independently   Standing Unsupported Able to stand safely 2 minutes   Sitting with Back Unsupported but Feet Supported on Floor or Stool Able to sit safely and securely 2 minutes   Stand to Sit Sits safely with minimal use of hands   Transfers Able to transfer safely, minor use of hands   Standing Unsupported with Eyes Closed Able to stand 10  seconds safely   Standing Ubsupported with Feet Together Able to place feet together independently and stand 1 minute safely   From Standing, Reach Forward with Outstretched Arm Can reach confidently >25 cm (10")   From Standing Position, Pick up Object from Floor Able to pick up shoe safely and easily   From Standing Position, Turn to Look Behind Over each Shoulder Looks behind from both sides and weight shifts well   Turn 360 Degrees Able to turn 360 degrees safely but slowly   Standing Unsupported, Alternately Place Feet on  Step/Stool Able to stand independently and complete 8 steps >20 seconds   Standing Unsupported, One Foot in Front Able to plae foot ahead of the other independently and hold 30 seconds   Standing on One Leg Tries to lift leg/unable to hold 3 seconds but remains standing independently   Total Score 49   Timed Up and Go Test   TUG Normal TUG   Normal TUG (seconds) 10.39  with SPC                PT Education - Oct 03, 2014 1244    Education provided Yes   Education Details PT reiterated the importance of using new SPC and to not walk dog outdoors (while traversing hills). Pt was able to verbalize understanding of fall risk prevention strategies.  PT educated pt on results for BERG, TUG and gait speed. Pt is pleased with progress and agree to d/c.   Person(s) Educated Patient   Methods Explanation   Comprehension Verbalized understanding          PT Short Term Goals - 09/02/14 1220    PT SHORT TERM GOAL #1   Title Pt will be independent in HEP to improve functional mobilty. Target date 09/01/14.   Status Achieved   PT SHORT TERM GOAL #2   Title Pt will improve BERG score to >/=39/56 to decrease falls risk. Target date 09/01/14.   Status Achieved   PT SHORT TERM GOAL #3   Title Pt will report zero falls over the last 4 weeks to improve safety during functional mobililty. Target date 09/01/14.   Status Achieved   PT SHORT TERM GOAL #4   Title Pt will ambulate  300' over even terrain with LRAD and supervision to improve functional mobility. Target date 09/01/14.   Status Achieved           PT Long Term Goals - 10/03/14 1248    PT LONG TERM GOAL #1   Title Pt will verbalize understanding of fall prevention strategies to reduce falls risk. Target date 10/03/14.   Status Achieved   PT LONG TERM GOAL #2   Title Pt will improve BERG score to 47/56 to decrease falls risk. Target date 10-03-2014.   Baseline Revised from 43 to 47, as pt met LTG on 09/01/14.   Status Achieved   PT LONG TERM GOAL #3   Title Pt will improve FOTO score by 10 points to improve quality of life. Target date 10-03-14.   Status Achieved   PT LONG TERM GOAL #4   Title Pt will perform TUG in </=13.5 seconds with LRAD to decrease falls risk. Target date 10/03/2014.   Status Achieved   PT LONG TERM GOAL #5   Title Pt will improve gait speed with LRAD to >/=1.8 ft/sec. to ambulate without risk for recurrent falls. Target date 10-03-2014.   Status Achieved   PT LONG TERM GOAL #6   Title Pt will ambulate 600' with LRAD over even/uneven terrain at MOD I level to improve functional mobility. Target date 10-03-2014.   Status Achieved               Plan - 10/03/2014 1248    Clinical Impression Statement Pt met all LTGs and is discharging from PT today. Please see d/c summary for details.          G-Codes - 10-03-14 1250    Functional Assessment Tool Used BERG 49/56; gait speed 3.34f/sec with SPC, TUG with SPC 10.39sec.   Functional Limitation Mobility:  Walking and moving around   Mobility: Walking and Moving Around Goal Status [id number]) At least 20 percent but less than 40 percent impaired, limited or restricted   Mobility: Walking and Moving Around Discharge Status [phone]) At least 1 percent but less than 20 percent impaired, limited or restricted      Problem List Patient Active Problem List   Diagnosis Date Noted  . General weakness 07/14/2014  . Urinary incontinence  07/14/2014  . Arthritis of foot, right, degenerative 04/15/2014  . Postoperative anemia due to acute blood loss 01/30/2014    Class: Acute  . Hypotension, unspecified 01/30/2014    Class: Acute  . Spinal stenosis, lumbar 01/27/2014  . Cellulitis of leg, right 10/15/2013  . Headache(784.0) 10/15/2013  . Uncontrolled type 2 DM with peripheral circulatory disorder 10/04/2013  . Acute posthemorrhagic anemia 10/04/2013  . Acute urinary retention 09/30/2013    Class: Acute  . Spinal stenosis in cervical region 09/26/2013    Class: Chronic  . Spinal stenosis, lumbar region, with neurogenic claudication 09/26/2013    Class: Chronic  . Cervical stenosis of spinal canal 09/26/2013  . Neck pain 09/02/2013  . Hand joint pain 06/10/2013  . Rotator cuff tear arthropathy of right shoulder 06/10/2013  . Skin lesion of cheek 05/01/2013  . Pain of right thumb 04/03/2013  . Balance disorder 03/12/2013  . Gait disorder 03/12/2013  . Tremor 03/12/2013  . Left hip pain 03/12/2013  . Low back pain 03/12/2013  . Pre-ulcerative corn or callous 02/06/2013  . Polypharmacy 06/25/2012    Class: Chronic  . Balance problems 12/25/2011  . Anxiety 11/12/2011  . OSA (obstructive sleep apnea) 11/07/2011  . Bradycardia 10/20/2011  . Insomnia 10/04/2011  . Lumbar degenerative disc disease 07/11/2011  . Obesity 01/12/2011  . Preventative health care 09/27/2010  . ERECTILE DYSFUNCTION, ORGANIC 05/30/2010  . [place] DISEASE, LUMBAR 04/19/2010  . SCIATICA, LEFT 04/19/2010  . Chronic pain syndrome 10/27/2009  . Hyperlipidemia 07/15/2009  . Depression 06/24/2009  . Essential hypertension 06/24/2009  . CAD (coronary artery disease) 06/24/2009  . Allergic rhinitis 06/24/2009  . URETHRAL STRICTURE 06/24/2009  . DEGENERATIVE JOINT DISEASE 06/24/2009  . SHOULDER PAIN, BILATERAL 06/24/2009  . FATIGUE 06/24/2009  . NEPHROLITHIASIS, HX OF 06/24/2009   PHYSICAL THERAPY DISCHARGE SUMMARY  Visits from Start of Care:  10  Current functional level related to goals / functional outcomes:     PT Long Term Goals - 09/29/14 1248    PT LONG TERM GOAL #1   Title Pt will verbalize understanding of fall prevention strategies to reduce falls risk. Target date 09/29/14.   Status Achieved   PT LONG TERM GOAL #2   Title Pt will improve BERG score to 47/56 to decrease falls risk. Target date 09/29/14.   Baseline Revised from 43 to 47, as pt met LTG on 09/01/14.   Status Achieved   PT LONG TERM GOAL #3   Title Pt will improve FOTO score by 10 points to improve quality of life. Target date 09/29/14.   Status Achieved   PT LONG TERM GOAL #4   Title Pt will perform TUG in </=13.5 seconds with LRAD to decrease falls risk. Target date 09/29/14.   Status Achieved   PT LONG TERM GOAL #5   Title Pt will improve gait speed with LRAD to >/=1.8 ft/sec. to ambulate without risk for recurrent falls. Target date 09/29/14.   Status Achieved   PT LONG TERM GOAL #6   Title Pt will ambulate 600' with  LRAD over even/uneven terrain at MOD I level to improve functional mobility. Target date 09/29/14.   Status Achieved        Remaining deficits: Decreased endurance and general weakness.   Education / Equipment: HEP and obtained order from MD for standard [facility]  Plan: Patient agrees to discharge.  Patient goals were met. Patient is being discharged due to meeting the stated rehab goals.  ?????        , L 09/29/2014, 12:51 PM  [facility] [address] [place], [place], [address] Phone: [phone]   Fax:  [phone]     Geoffry Paradise, PT,DPT 09/29/2014 12:51 PM Phone: [phone] Fax: [phone]

## 2014-09-30 ENCOUNTER — Encounter: Payer: Self-pay | Admitting: Physical Therapy

## 2014-10-02 ENCOUNTER — Ambulatory Visit (INDEPENDENT_AMBULATORY_CARE_PROVIDER_SITE_OTHER): Payer: Medicare PPO | Admitting: Internal Medicine

## 2014-10-02 ENCOUNTER — Encounter: Payer: Self-pay | Admitting: Internal Medicine

## 2014-10-02 VITALS — BP 90/52 | HR 61 | Temp 98.2°F | Ht 73.0 in | Wt 227.0 lb

## 2014-10-02 DIAGNOSIS — E1165 Type 2 diabetes mellitus with hyperglycemia: Secondary | ICD-10-CM

## 2014-10-02 DIAGNOSIS — E1159 Type 2 diabetes mellitus with other circulatory complications: Secondary | ICD-10-CM | POA: Diagnosis not present

## 2014-10-02 DIAGNOSIS — E1151 Type 2 diabetes mellitus with diabetic peripheral angiopathy without gangrene: Secondary | ICD-10-CM

## 2014-10-02 DIAGNOSIS — I10 Essential (primary) hypertension: Secondary | ICD-10-CM

## 2014-10-02 DIAGNOSIS — E785 Hyperlipidemia, unspecified: Secondary | ICD-10-CM | POA: Diagnosis not present

## 2014-10-02 DIAGNOSIS — IMO0002 Reserved for concepts with insufficient information to code with codable children: Secondary | ICD-10-CM

## 2014-10-02 NOTE — Assessment & Plan Note (Signed)
stable overall by history and exam, recent data reviewed with pt, and pt to continue medical treatment as before,  to f/u any worsening symptoms or concerns BP Readings from Last 3 Encounters:  10/02/14 90/52  09/09/14 108/64  08/18/14 83/49

## 2014-10-02 NOTE — Patient Instructions (Signed)
You are given the prescription for the Diabetic shoes with orthotics  Please continue all other medications as before, and refills have been done if requested.  Please have the pharmacy call with any other refills you may need.  Please continue your efforts at being more active, low cholesterol diet, and weight control.  Please keep your appointments with your specialists as you may have planned

## 2014-10-02 NOTE — Progress Notes (Signed)
Subjective:    Patient ID: Travis Fortis Sr., male    DOB: 07-21-1950, 64 y.o.   MRN: [id number]  HPI here to f/u for DM foot evaluation, no c/o recent foot pain, open sores, neuropathy of vascular insufficiency.  Has seen ortho previously for feet.  Pt denies chest pain, increased sob or doe, wheezing, orthopnea, PND, increased LE swelling, palpitations, dizziness or syncope.  Pt denies new neurological symptoms such as new headache, or facial or extremity weakness or numbness  Pt denies polydipsia, polyuria Past Medical History  Diagnosis Date  . Impaired glucose tolerance 09/27/2010  . HYPERLIPIDEMIA-MIXED 07/15/2009  . Chronic pain syndrome 10/27/2009  . HYPERTENSION 06/24/2009  . CORONARY ARTERY DISEASE 06/24/2009    a. s/p multiple PCIs - In 2008 he had a Taxus DES to the mild LAD, Endeavor DES to mid LCX and distal LCX. In January 2009 he had DES to distal LCX, mid LCX and proximal LCX. In November 2009 had BMS x 2 to the mid RCA. Cath 10/2011 with patent stents, noncardiac CP. LHC 01/2013: patent stents (noncardiac CP).  [name] URETHRAL STRICTURE 06/24/2009  . SHOULDER PAIN, BILATERAL 06/24/2009  . ANKLE PAIN, RIGHT 06/24/2009  . SCIATICA, LEFT 04/19/2010  . NEPHROLITHIASIS, HX OF 06/24/2009  . ERECTILE DYSFUNCTION, ORGANIC 05/30/2010  . Obesity   . ALLERGIC RHINITIS 06/24/2009  . Anxiety   . Depression     Prior suicide attempt  . Irregular heart beat   . Pneumonia   . Self-catheterizes urinary bladder   . Anginal pain   . Type II diabetes mellitus   . Hepatitis C dx'd 1979  . DEGENERATIVE JOINT DISEASE 06/24/2009  . [place] DISEASE, LUMBAR 04/19/2010  . Arthritis     "all my joints" (09/30/2013)  . Fibromyalgia     "left leg" (09/30/2013)  . Chronic back pain     "my whole back" (09/30/2013)  . Kidney stones     "passed on my own"  . Myocardial infarction 2008  . OSA (obstructive sleep apnea)     not using CPAP (09/30/2013)  . Heart murmur    Past Surgical History  Procedure Laterality Date    . Shoulder open rotator cuff repair Right X 2  . Total knee arthroplasty Bilateral 2008  . Carpal tunnel release Bilateral   . Urethral dilation  X 4  . Knee arthroscopy Right X 7  . Umbilical hernia repair      UHR  . Hernia repair      umbilical  . Shoulder arthroscopy w/ rotator cuff repair Bilateral     "3on the left; 2 on the right"  . Tonsillectomy    . Knee cartilage surgery Right X 4    "open; not scopes"  . Knee cartilage surgery Left X 3    "open; not scopes"  . Coronary angioplasty with stent placement      "I have 9 stents"  . Cardiac catheterization  X 1  . Anterior cervical decomp/discectomy fusion N/A 09/26/2013    Procedure: ANTERIOR CERVICAL DISCECTOMY FUSION C3-4, plate and screw fixation, allograft bone graft;  Surgeon: Jessy Oto, MD;  Location: [facility];  Service: Orthopedics;  Laterality: N/A;  . Laminectomy  01/28/2014    L3 L4 L5  . Lumbar laminectomy/decompression microdiscectomy N/A 01/27/2014    Procedure: CENTRAL LUMBAR LAMINECTOMY L4-5 AND L3-4;  Surgeon: Jessy Oto, MD;  Location: [facility];  Service: Orthopedics;  Laterality: N/A;  . Joint replacement Bilateral     knees  .  Colonoscopy    . Vasectomy    . Fusion of talonavicular joint Right 04/15/2014    dr duda  . Ankle fusion Right 04/15/2014    Procedure: Right Subtalar, Talonavicular Fusion;  Surgeon: Newt Minion, MD;  Location: [facility];  Service: Orthopedics;  Laterality: Right;  . Left heart catheterization with coronary angiogram N/A 02/10/2013    Procedure: LEFT HEART CATHETERIZATION WITH CORONARY ANGIOGRAM;  Surgeon: Burnell Blanks, MD;  Location: [facility] CATH LAB;  Service: Cardiovascular;  Laterality: N/A;    reports that he quit smoking about 4 years ago. His smoking use included Cigars. He has never used smokeless tobacco. He reports that he does not drink alcohol or use illicit drugs. family history includes Cancer in his father and mother; Coronary artery disease in his other;  Depression in his brother, mother, and other; Diabetes in his father; Early death in his paternal grandfather; Early death (age of onset: 4) in his maternal grandfather; Heart disease in his father, maternal grandfather, and mother; Hyperlipidemia in his father; Hypertension in his brother, father, mother, and other. No Known Allergies Current Outpatient Prescriptions on File Prior to Visit  Medication Sig Dispense Refill  . ARIPiprazole (ABILIFY) 5 MG tablet Take 1 tablet (5 mg total) by mouth daily. 90 tablet 0  . aspirin 81 MG tablet Take 1 tablet (81 mg total) by mouth daily. 100 tablet 0  . atorvastatin (LIPITOR) 20 MG tablet Take 20 mg by mouth daily.    . benazepril (LOTENSIN) 20 MG tablet TAKE 1 TABLET BY MOUTH EVERY DAY 90 tablet 0  . clonazePAM (KLONOPIN) 1 MG tablet Take 1 tablet (1 mg total) by mouth at bedtime as needed for anxiety. 30 tablet 5  . divalproex (DEPAKOTE) 250 MG DR tablet Take 1-2 tablets (250-500 mg total) by mouth 2 (two) times daily. 250 mg in morning and 500 mg at bedtime 90 tablet 3  . docusate sodium 100 MG CAPS Take 100 mg by mouth 2 (two) times daily. 100 capsule 0  . ferrous gluconate (FERGON) 324 MG tablet Take 1 tablet (324 mg total) by mouth 3 (three) times daily with meals. 60 tablet 3  . FLUoxetine (PROZAC) 40 MG capsule Take 1 capsule (40 mg total) by mouth daily. 90 capsule 0  . fluticasone (FLONASE) 50 MCG/ACT nasal spray Place 1 spray into both nostrils daily.    [name] gabapentin (NEURONTIN) 400 MG capsule Take 2 capsules (800 mg total) by mouth 4 (four) times daily. 240 capsule 3  . isosorbide mononitrate (IMDUR) 60 MG 24 hr tablet TAKE 1& ONE-HALF TABLETS EVERY DAY 135 tablet 0  . metFORMIN (GLUCOPHAGE) 500 MG tablet Take 1 tablet (500 mg total) by mouth daily with breakfast. (Patient taking differently: Take 500 mg by mouth 2 (two) times daily. ) 90 tablet 3  . methocarbamol (ROBAXIN) 500 MG tablet Take 1 tablet (500 mg total) by mouth every 6 (six) hours  as needed for muscle spasms. 120 tablet 3  . Omega-3 Fatty Acids (FISH OIL) 1000 MG CAPS Take 2 capsules by mouth daily.    [name] oxymorphone (OPANA) 10 MG tablet   0  . polyethylene glycol (MIRALAX / GLYCOLAX) packet Take 17 g by mouth 2 (two) times daily. (Patient taking differently: Take 17 g by mouth daily. ) 60 each 3  . prasugrel (EFFIENT) 10 MG TABS tablet Take 1 tablet (10 mg total) by mouth daily. 30 tablet 1  . sildenafil (VIAGRA) 50 MG tablet Take 1 tablet (50  mg total) by mouth daily as needed for erectile dysfunction. 10 tablet 0  . tamsulosin (FLOMAX) 0.4 MG CAPS capsule Take 0.4 mg by mouth daily after breakfast.    . traZODone (DESYREL) 100 MG tablet Take 1 tablet (100 mg total) by mouth at bedtime. 90 tablet 0   No current facility-administered medications on file prior to visit.   Review of Systems  Constitutional: Negative for unusual diaphoresis or night sweats HENT: Negative for ringing in ear or discharge Eyes: Negative for double vision or worsening visual disturbance.  Respiratory: Negative for choking and stridor.   Gastrointestinal: Negative for vomiting or other signifcant bowel change Genitourinary: Negative for hematuria or change in urine volume.  Musculoskeletal: Negative for other MSK pain or swelling Skin: Negative for color change and worsening wound.  Neurological: Negative for tremors and numbness other than noted  Psychiatric/Behavioral: Negative for decreased concentration or agitation other than above       Objective:   Physical Exam BP 90/52 mmHg  Pulse 61  Temp(Src) 98.2 F (36.8 C) (Oral)  Ht 6' 1"  (1.854 m)  Wt 227 lb (102.967 kg)  BMI 29.96 kg/m2  SpO2 96% VS noted,  Constitutional: Pt appears in no significant distress HENT: Head: NCAT.  Right Ear: External ear normal.  Left Ear: External ear normal.  Eyes: . Pupils are equal, round, and reactive to light. Conjunctivae and EOM are normal Neck: Normal range of motion. Neck supple.    Cardiovascular: Normal rate and regular rhythm.   Pulmonary/Chest: Effort normal and breath sounds without rales or wheezing.  Abd:  Soft, NT, ND, + BS Neurological: Pt is alert. Not confused , motor grossly intact Skin: Skin is warm. No rash, no LE edema Psychiatric: Pt behavior is normal. No agitation.  Bilat feet with fallen arches, trace bilat dorsalis pedis, Sens intact to LT, no open wounds or ulcers, no tender or swelling, but significant bilat heel and lateral feet callouses and mild hammertoes bilat 2nd and 3rd toes    Assessment & Plan:

## 2014-10-02 NOTE — Progress Notes (Signed)
Pre visit review using our clinic review tool, if applicable. No additional management support is needed unless otherwise documented below in the visit note. 

## 2014-10-02 NOTE — Assessment & Plan Note (Signed)
stable overall by history and exam, recent data reviewed with pt, and pt to continue medical treatment as before,  to f/u any worsening symptoms or concerns Lab Results  Component Value Date   LDLCALC 57 07/14/2014

## 2014-10-02 NOTE — Assessment & Plan Note (Signed)
stable overall by history and exam, recent data reviewed with pt, and pt to continue medical treatment as before,  to f/u any worsening symptoms or concerns, gave rx for DM shoes with orthotics

## 2014-10-06 ENCOUNTER — Encounter: Payer: Self-pay | Admitting: Physical Therapy

## 2014-10-12 ENCOUNTER — Ambulatory Visit (INDEPENDENT_AMBULATORY_CARE_PROVIDER_SITE_OTHER): Payer: Medicare PPO | Admitting: Physician Assistant

## 2014-10-12 ENCOUNTER — Encounter: Payer: Self-pay | Admitting: Physician Assistant

## 2014-10-12 DIAGNOSIS — I951 Orthostatic hypotension: Secondary | ICD-10-CM | POA: Diagnosis not present

## 2014-10-12 DIAGNOSIS — I2510 Atherosclerotic heart disease of native coronary artery without angina pectoris: Secondary | ICD-10-CM

## 2014-10-12 NOTE — Patient Instructions (Signed)
Medication Instructions:  STOP TAKING :  NORVASC   IMDUR   LOTENSIN  Labwork:   Testing/Procedures:   Follow-Up:  WITH MICHLELE LENZE ON 08/20/14  @ 10 AM PER MICHELE   WITH DR [facility] IN 3 MONTHS   Any Other Special Instructions Will Be Listed Below (If Applicable).

## 2014-10-12 NOTE — Assessment & Plan Note (Signed)
Patient is extremely orthostatic. I will stop his amlodipine, benazepril, Imdur, and lisinopril. He is to stop taking the energy tablets. Increase fluids and food intake. Follow-up with me next week. Dr. Algernon Huxley in 2 months.

## 2014-10-12 NOTE — Assessment & Plan Note (Signed)
Stable without chest pain. Patient has been out of Effient for 1 month. We'll give samples and new prescription.

## 2014-10-12 NOTE — Assessment & Plan Note (Signed)
Lipid profile in February was good. Continue Lipitor.

## 2014-10-12 NOTE — Progress Notes (Signed)
Cardiology Office Note   Date:  10/12/2014   ID:  Travis Fortis Sr., DOB 04/10/1951, MRN [id number]  PCP:  [name], MD  Cardiologist: Loralie Champagne, MD  Chief Complaint: dizzy, palpitations    History of Present Illness: Travis Bissonette Sr. is a 64 y.o. male who presents for yearly follow-up of low it's been 2 1/2 years since he's been seen. He has history of CAD status post multiple PCI's.In 12/08 had Taxus DES to mLAD, Endeavor DES to [facility] and to dCFX. 11/09 had BMS x 2 to mid RCA. 1/09 had Xience DES to dCFX, mCFX, and pCFX. Lexiscan myoview (3/11): EF 50%, apical hypokinesis, possible small inferoapical infarct with no ischemia. Low risk. LHC (6/13): nonobstructive disease. LHC (9/14): EF 55%, patent stents with nonobstructive disease, EF 55%. Patient also has hypertension, hyperlipidemia, OSA, depression, and prior obesity.  The patient has lost 165 pounds. He takes 2 energy tablets from [employer] every day.[facility] to gain muscle. He complains of excessive dizziness every time he stands up. This started a few months ago but is gotten dramatically worse in the past week. He says his heart races when he becomes dizzy. Once he stabilizes heart rate slows down and the dizziness goes away. He feels like he tremors when he first stands up. Initial blood pressure when he got here was 60/40. He continues to try to lose weight but claims that he is staying hydrated and eating a healthy diet. He denies any chest pain. He ran out of his Effient one month ago because he couldn't afford it. He has Plavix resistance.  Past Medical History  Diagnosis Date  . Impaired glucose tolerance 09/27/2010  . HYPERLIPIDEMIA-MIXED 07/15/2009  . Chronic pain syndrome 10/27/2009  . HYPERTENSION 06/24/2009  . CORONARY ARTERY DISEASE 06/24/2009    a. s/p multiple PCIs - In 2008 he had a Taxus DES to the mild LAD, Endeavor DES to mid LCX and distal LCX. In January 2009 he  had DES to distal LCX, mid LCX and proximal LCX. In November 2009 had BMS x 2 to the mid RCA. Cath 10/2011 with patent stents, noncardiac CP. LHC 01/2013: patent stents (noncardiac CP).  [name] URETHRAL STRICTURE 06/24/2009  . SHOULDER PAIN, BILATERAL 06/24/2009  . ANKLE PAIN, RIGHT 06/24/2009  . SCIATICA, LEFT 04/19/2010  . NEPHROLITHIASIS, HX OF 06/24/2009  . ERECTILE DYSFUNCTION, ORGANIC 05/30/2010  . Obesity   . ALLERGIC RHINITIS 06/24/2009  . Anxiety   . Depression     Prior suicide attempt  . Irregular heart beat   . Pneumonia   . Self-catheterizes urinary bladder   . Anginal pain   . Type II diabetes mellitus   . Hepatitis C dx'd 1979  . DEGENERATIVE JOINT DISEASE 06/24/2009  . [place] DISEASE, LUMBAR 04/19/2010  . Arthritis     "all my joints" (09/30/2013)  . Fibromyalgia     "left leg" (09/30/2013)  . Chronic back pain     "my whole back" (09/30/2013)  . Kidney stones     "passed on my own"  . Myocardial infarction 2008  . OSA (obstructive sleep apnea)     not using CPAP (09/30/2013)  . Heart murmur     Past Surgical History  Procedure Laterality Date  . Shoulder open rotator cuff repair Right X 2  . Total knee arthroplasty Bilateral 2008  . Carpal tunnel release Bilateral   . Urethral dilation  X 4  . Knee arthroscopy  Right X 7  . Umbilical hernia repair      UHR  . Hernia repair      umbilical  . Shoulder arthroscopy w/ rotator cuff repair Bilateral     "3on the left; 2 on the right"  . Tonsillectomy    . Knee cartilage surgery Right X 4    "open; not scopes"  . Knee cartilage surgery Left X 3    "open; not scopes"  . Coronary angioplasty with stent placement      "I have 9 stents"  . Cardiac catheterization  X 1  . Anterior cervical decomp/discectomy fusion N/A 09/26/2013    Procedure: ANTERIOR CERVICAL DISCECTOMY FUSION C3-4, plate and screw fixation, allograft bone graft;  Surgeon: Jessy Oto, MD;  Location: [place];  Service: Orthopedics;  Laterality: N/A;  . Laminectomy   01/28/2014    L3 L4 L5  . Lumbar laminectomy/decompression microdiscectomy N/A 01/27/2014    Procedure: CENTRAL LUMBAR LAMINECTOMY L4-5 AND L3-4;  Surgeon: Jessy Oto, MD;  Location: [place];  Service: Orthopedics;  Laterality: N/A;  . Joint replacement Bilateral     knees  . Colonoscopy    . Vasectomy    . Fusion of talonavicular joint Right 04/15/2014    dr duda  . Ankle fusion Right 04/15/2014    Procedure: Right Subtalar, Talonavicular Fusion;  Surgeon: Newt Minion, MD;  Location: [place];  Service: Orthopedics;  Laterality: Right;  . Left heart catheterization with coronary angiogram N/A 02/10/2013    Procedure: LEFT HEART CATHETERIZATION WITH CORONARY ANGIOGRAM;  Surgeon: Burnell Blanks, MD;  Location: [facility] CATH LAB;  Service: Cardiovascular;  Laterality: N/A;     Current Outpatient Prescriptions  Medication Sig Dispense Refill  . Alcohol Swabs (ALCOHOL PREP) 70 % PADS     . amLODipine (NORVASC) 5 MG tablet     . ARIPiprazole (ABILIFY) 5 MG tablet Take 1 tablet (5 mg total) by mouth daily. 90 tablet 0  . aspirin 81 MG tablet Take 1 tablet (81 mg total) by mouth daily. 100 tablet 0  . ASSURE COMFORT LANCETS 30G MISC     . atorvastatin (LIPITOR) 20 MG tablet Take 20 mg by mouth daily.    . benazepril (LOTENSIN) 20 MG tablet TAKE 1 TABLET BY MOUTH EVERY DAY 90 tablet 0  . Blood Glucose Calibration (EMBRACE CONTROL) LOW SOLN     . Blood Glucose Monitoring Suppl (EMBRACE BLOOD GLUCOSE MONITOR) DEVI     . clonazePAM (KLONOPIN) 1 MG tablet Take 1 tablet (1 mg total) by mouth at bedtime as needed for anxiety. 30 tablet 5  . divalproex (DEPAKOTE) 250 MG DR tablet Take 1-2 tablets (250-500 mg total) by mouth 2 (two) times daily. 250 mg in morning and 500 mg at bedtime 90 tablet 3  . docusate sodium 100 MG CAPS Take 100 mg by mouth 2 (two) times daily. 100 capsule 0  . EMBRACE BLOOD GLUCOSE TEST test strip     . ferrous gluconate (FERGON) 324 MG tablet Take 1 tablet (324 mg total) by  mouth 3 (three) times daily with meals. 60 tablet 3  . FLUoxetine (PROZAC) 40 MG capsule Take 1 capsule (40 mg total) by mouth daily. 90 capsule 0  . fluticasone (FLONASE) 50 MCG/ACT nasal spray Place 1 spray into both nostrils daily.    [name] gabapentin (NEURONTIN) 400 MG capsule Take 2 capsules (800 mg total) by mouth 4 (four) times daily. 240 capsule 3  . isosorbide mononitrate (IMDUR) 60  MG 24 hr tablet TAKE 1& ONE-HALF TABLETS EVERY DAY 135 tablet 0  . Lancet Devices (ADJUSTABLE LANCING DEVICE) MISC     . lisinopril (PRINIVIL,ZESTRIL) 20 MG tablet     . metFORMIN (GLUCOPHAGE) 500 MG tablet Take 1 tablet (500 mg total) by mouth daily with breakfast. (Patient taking differently: Take 500 mg by mouth 2 (two) times daily. ) 90 tablet 3  . methocarbamol (ROBAXIN) 500 MG tablet Take 1 tablet (500 mg total) by mouth every 6 (six) hours as needed for muscle spasms. 120 tablet 3  . Omega-3 Fatty Acids (FISH OIL) 1000 MG CAPS Take 2 capsules by mouth daily.    [name] oxycodone (ROXICODONE) 30 MG immediate release tablet   0  . oxymorphone (OPANA) 10 MG tablet   0  . prasugrel (EFFIENT) 10 MG TABS tablet Take 1 tablet (10 mg total) by mouth daily. 30 tablet 1  . sildenafil (VIAGRA) 50 MG tablet Take 1 tablet (50 mg total) by mouth daily as needed for erectile dysfunction. 10 tablet 0  . tamsulosin (FLOMAX) 0.4 MG CAPS capsule Take 0.4 mg by mouth daily after breakfast.    . traZODone (DESYREL) 100 MG tablet Take 1 tablet (100 mg total) by mouth at bedtime. 90 tablet 0   No current facility-administered medications for this visit.    Allergies:   Review of patient's allergies indicates no known allergies.    Social History:  The patient  reports that he quit smoking about 4 years ago. His smoking use included Cigars. He has never used smokeless tobacco. He reports that he does not drink alcohol or use illicit drugs.   Family History:  The patient's    family history includes Cancer in his father and mother;  Coronary artery disease in his other; Depression in his brother, mother, and other; Diabetes in his father; Early death in his paternal grandfather; Early death (age of onset: 22) in his maternal grandfather; Heart disease in his father, maternal grandfather, and mother; Hyperlipidemia in his father; Hypertension in his brother, father, mother, and other.    ROS:  Please see the history of present illness.   Otherwise, review of systems are positive for change in his appetite dizzy with visual changes, irregular heartbeats, snoring, depression and anxiety, balance issues, easy bruising.   All other systems are reviewed and negative.    PHYSICAL EXAM: VS:  BP 60/40 mmHg  Pulse 93  Ht 6' 1"  (1.854 m)  Wt 220 lb 12.8 oz (100.154 kg)  BMI 29.14 kg/m2  SpO2 93% , BMI Body mass index is 29.14 kg/(m^2). GEN: Well nourished, well developed, in no acute distress Neck: no JVD, HJR, carotid bruits, or masses Cardiac: RRR; no murmurs,gallop, rubs, thrill or heave,  Respiratory:  clear to auscultation bilaterally, normal work of breathing GI: soft, nontender, nondistended, + BS MS: no deformity or atrophy Extremities: Large brace on his left ankle without cyanosis, clubbing, edema, good distal pulses bilaterally.  Skin: warm and dry, no rash Neuro:  Strength and sensation are intact    EKG:  EKG is ordered today. The ekg ordered today demonstrates normal sinus rhythm at 64 bpm  Recent Labs: 01/28/2014: Pro B Natriuretic peptide (BNP) 22.1 07/14/2014: ALT 8; BUN 26*; Creatinine 0.89; Hemoglobin 11.1*; Platelets 224.0; Potassium 4.2; Sodium 141; TSH 0.91    Lipid Panel    Component Value Date/Time   CHOL 108 07/14/2014 1525   TRIG 75.0 07/14/2014 1525   HDL 36.30* 07/14/2014 1525   CHOLHDL  3 07/14/2014 1525   VLDL 15.0 07/14/2014 1525   LDLCALC 57 07/14/2014 1525      Wt Readings from Last 3 Encounters:  10/12/14 220 lb 12.8 oz (100.154 kg)  10/02/14 227 lb (102.967 kg)  09/09/14 244 lb  (110.678 kg)      Other studies Reviewed: Additional studies/ records that were reviewed today include and review of the records demonstrates:   ASSESSMENT AND PLAN: Orthostatic hypotension Patient is extremely orthostatic. I will stop his amlodipine, benazepril, Imdur, and lisinopril. He is to stop taking the energy tablets. Increase fluids and food intake. Follow-up with me next week. Dr. Algernon Huxley in 2 months.   CAD (coronary artery disease) Stable without chest pain. Patient has been out of Effient for 1 month. We'll give samples and new prescription.   Hyperlipidemia Lipid profile in February was good. Continue Lipitor.      Travis Boast, PA-C  10/12/2014 1:01 PM    [facility] [address], [place], [place]  [address] Phone: [phone]; Fax: [phone]

## 2014-10-14 ENCOUNTER — Encounter: Payer: Self-pay | Admitting: Physical Therapy

## 2014-10-17 ENCOUNTER — Inpatient Hospital Stay (HOSPITAL_COMMUNITY)
Admission: EM | Admit: 2014-10-17 | Discharge: 2014-10-22 | DRG: 988 | Disposition: A | Discharge status: Home / Self Care | Payer: Medicare PPO | Attending: Internal Medicine | Admitting: Internal Medicine

## 2014-10-17 ENCOUNTER — Encounter (HOSPITAL_COMMUNITY): Payer: Self-pay | Admitting: Emergency Medicine

## 2014-10-17 ENCOUNTER — Emergency Department (HOSPITAL_COMMUNITY): Payer: Medicare PPO

## 2014-10-17 DIAGNOSIS — R55 Syncope and collapse: Secondary | ICD-10-CM | POA: Diagnosis not present

## 2014-10-17 DIAGNOSIS — W19XXXA Unspecified fall, initial encounter: Secondary | ICD-10-CM

## 2014-10-17 DIAGNOSIS — E1165 Type 2 diabetes mellitus with hyperglycemia: Secondary | ICD-10-CM | POA: Diagnosis present

## 2014-10-17 DIAGNOSIS — Z981 Arthrodesis status: Secondary | ICD-10-CM

## 2014-10-17 DIAGNOSIS — N400 Enlarged prostate without lower urinary tract symptoms: Secondary | ICD-10-CM | POA: Diagnosis present

## 2014-10-17 DIAGNOSIS — R42 Dizziness and giddiness: Secondary | ICD-10-CM | POA: Diagnosis present

## 2014-10-17 DIAGNOSIS — K3184 Gastroparesis: Secondary | ICD-10-CM | POA: Diagnosis present

## 2014-10-17 DIAGNOSIS — Z79899 Other long term (current) drug therapy: Secondary | ICD-10-CM | POA: Diagnosis not present

## 2014-10-17 DIAGNOSIS — Z87891 Personal history of nicotine dependence: Secondary | ICD-10-CM

## 2014-10-17 DIAGNOSIS — E869 Volume depletion, unspecified: Secondary | ICD-10-CM | POA: Diagnosis present

## 2014-10-17 DIAGNOSIS — K208 Other esophagitis: Secondary | ICD-10-CM | POA: Diagnosis present

## 2014-10-17 DIAGNOSIS — S92901D Unspecified fracture of right foot, subsequent encounter for fracture with routine healing: Secondary | ICD-10-CM | POA: Diagnosis not present

## 2014-10-17 DIAGNOSIS — E785 Hyperlipidemia, unspecified: Secondary | ICD-10-CM | POA: Diagnosis present

## 2014-10-17 DIAGNOSIS — M1990 Unspecified osteoarthritis, unspecified site: Secondary | ICD-10-CM | POA: Diagnosis present

## 2014-10-17 DIAGNOSIS — K635 Polyp of colon: Secondary | ICD-10-CM | POA: Diagnosis present

## 2014-10-17 DIAGNOSIS — I252 Old myocardial infarction: Secondary | ICD-10-CM | POA: Diagnosis not present

## 2014-10-17 DIAGNOSIS — F319 Bipolar disorder, unspecified: Secondary | ICD-10-CM | POA: Diagnosis present

## 2014-10-17 DIAGNOSIS — Z7902 Long term (current) use of antithrombotics/antiplatelets: Secondary | ICD-10-CM

## 2014-10-17 DIAGNOSIS — I9589 Other hypotension: Secondary | ICD-10-CM | POA: Diagnosis present

## 2014-10-17 DIAGNOSIS — N359 Urethral stricture, unspecified: Secondary | ICD-10-CM | POA: Diagnosis present

## 2014-10-17 DIAGNOSIS — S7001XA Contusion of right hip, initial encounter: Secondary | ICD-10-CM | POA: Diagnosis present

## 2014-10-17 DIAGNOSIS — D124 Benign neoplasm of descending colon: Secondary | ICD-10-CM | POA: Insufficient documentation

## 2014-10-17 DIAGNOSIS — I10 Essential (primary) hypertension: Secondary | ICD-10-CM | POA: Diagnosis present

## 2014-10-17 DIAGNOSIS — D696 Thrombocytopenia, unspecified: Secondary | ICD-10-CM | POA: Diagnosis present

## 2014-10-17 DIAGNOSIS — Z7901 Long term (current) use of anticoagulants: Secondary | ICD-10-CM | POA: Diagnosis not present

## 2014-10-17 DIAGNOSIS — Z955 Presence of coronary angioplasty implant and graft: Secondary | ICD-10-CM

## 2014-10-17 DIAGNOSIS — D649 Anemia, unspecified: Principal | ICD-10-CM | POA: Diagnosis present

## 2014-10-17 DIAGNOSIS — D120 Benign neoplasm of cecum: Secondary | ICD-10-CM | POA: Diagnosis not present

## 2014-10-17 DIAGNOSIS — K2960 Other gastritis without bleeding: Secondary | ICD-10-CM | POA: Diagnosis present

## 2014-10-17 DIAGNOSIS — D62 Acute posthemorrhagic anemia: Secondary | ICD-10-CM | POA: Diagnosis not present

## 2014-10-17 DIAGNOSIS — F419 Anxiety disorder, unspecified: Secondary | ICD-10-CM | POA: Diagnosis present

## 2014-10-17 DIAGNOSIS — S92351A Displaced fracture of fifth metatarsal bone, right foot, initial encounter for closed fracture: Secondary | ICD-10-CM | POA: Diagnosis present

## 2014-10-17 DIAGNOSIS — G4733 Obstructive sleep apnea (adult) (pediatric): Secondary | ICD-10-CM | POA: Diagnosis present

## 2014-10-17 DIAGNOSIS — B1920 Unspecified viral hepatitis C without hepatic coma: Secondary | ICD-10-CM | POA: Diagnosis present

## 2014-10-17 DIAGNOSIS — R195 Other fecal abnormalities: Secondary | ICD-10-CM

## 2014-10-17 DIAGNOSIS — I2510 Atherosclerotic heart disease of native coronary artery without angina pectoris: Secondary | ICD-10-CM | POA: Diagnosis present

## 2014-10-17 DIAGNOSIS — Y92410 Unspecified street and highway as the place of occurrence of the external cause: Secondary | ICD-10-CM | POA: Diagnosis not present

## 2014-10-17 DIAGNOSIS — S92901A Unspecified fracture of right foot, initial encounter for closed fracture: Secondary | ICD-10-CM

## 2014-10-17 DIAGNOSIS — S92341A Displaced fracture of fourth metatarsal bone, right foot, initial encounter for closed fracture: Secondary | ICD-10-CM | POA: Diagnosis present

## 2014-10-17 DIAGNOSIS — E1143 Type 2 diabetes mellitus with diabetic autonomic (poly)neuropathy: Secondary | ICD-10-CM | POA: Diagnosis present

## 2014-10-17 DIAGNOSIS — N179 Acute kidney failure, unspecified: Secondary | ICD-10-CM | POA: Diagnosis not present

## 2014-10-17 DIAGNOSIS — Z6831 Body mass index (BMI) 31.0-31.9, adult: Secondary | ICD-10-CM

## 2014-10-17 DIAGNOSIS — G894 Chronic pain syndrome: Secondary | ICD-10-CM | POA: Diagnosis present

## 2014-10-17 DIAGNOSIS — S92331A Displaced fracture of third metatarsal bone, right foot, initial encounter for closed fracture: Secondary | ICD-10-CM | POA: Diagnosis present

## 2014-10-17 DIAGNOSIS — Z96653 Presence of artificial knee joint, bilateral: Secondary | ICD-10-CM | POA: Diagnosis present

## 2014-10-17 DIAGNOSIS — Z7982 Long term (current) use of aspirin: Secondary | ICD-10-CM | POA: Diagnosis not present

## 2014-10-17 DIAGNOSIS — E669 Obesity, unspecified: Secondary | ICD-10-CM | POA: Diagnosis present

## 2014-10-17 DIAGNOSIS — I959 Hypotension, unspecified: Secondary | ICD-10-CM

## 2014-10-17 HISTORY — DX: Other chest pain: R07.89

## 2014-10-17 HISTORY — DX: Other specified anxiety disorders: F41.8

## 2014-10-17 HISTORY — DX: Other fecal abnormalities: R19.5

## 2014-10-17 HISTORY — DX: Unspecified fracture of right foot, initial encounter for closed fracture: S92.901A

## 2014-10-17 LAB — URINALYSIS, ROUTINE W REFLEX MICROSCOPIC
GLUCOSE, UA: NEGATIVE mg/dL
Hgb urine dipstick: NEGATIVE
Ketones, ur: 15 mg/dL — AB
Leukocytes, UA: NEGATIVE
Nitrite: NEGATIVE
Protein, ur: NEGATIVE mg/dL
Specific Gravity, Urine: 1.02 (ref 1.005–1.030)
Urobilinogen, UA: 1 mg/dL (ref 0.0–1.0)
pH: 5.5 (ref 5.0–8.0)

## 2014-10-17 LAB — CBC WITH DIFFERENTIAL/PLATELET
Basophils Absolute: 0 10*3/uL (ref 0.0–0.1)
Basophils Relative: 0 % (ref 0–1)
Eosinophils Absolute: 0 10*3/uL (ref 0.0–0.7)
Eosinophils Relative: 0 % (ref 0–5)
HCT: 23.5 % — ABNORMAL LOW (ref 39.0–52.0)
HEMOGLOBIN: 8.2 g/dL — AB (ref 13.0–17.0)
Lymphocytes Relative: 7 % — ABNORMAL LOW (ref 12–46)
Lymphs Abs: 0.3 10*3/uL — ABNORMAL LOW (ref 0.7–4.0)
MCH: 31.1 pg (ref 26.0–34.0)
MCHC: 34.9 g/dL (ref 30.0–36.0)
MCV: 89 fL (ref 78.0–100.0)
MONOS PCT: 12 % (ref 3–12)
Monocytes Absolute: 0.6 10*3/uL (ref 0.1–1.0)
Neutro Abs: 3.8 10*3/uL (ref 1.7–7.7)
Neutrophils Relative %: 81 % — ABNORMAL HIGH (ref 43–77)
Platelets: 138 10*3/uL — ABNORMAL LOW (ref 150–400)
RBC: 2.64 MIL/uL — AB (ref 4.22–5.81)
RDW: 13.5 % (ref 11.5–15.5)
WBC: 4.7 10*3/uL (ref 4.0–10.5)

## 2014-10-17 LAB — CBC
HCT: 23.4 % — ABNORMAL LOW (ref 39.0–52.0)
Hemoglobin: 8.3 g/dL — ABNORMAL LOW (ref 13.0–17.0)
MCH: 31.2 pg (ref 26.0–34.0)
MCHC: 35.5 g/dL (ref 30.0–36.0)
MCV: 88 fL (ref 78.0–100.0)
Platelets: 154 10*3/uL (ref 150–400)
RBC: 2.66 MIL/uL — AB (ref 4.22–5.81)
RDW: 13.5 % (ref 11.5–15.5)
WBC: 5.4 10*3/uL (ref 4.0–10.5)

## 2014-10-17 LAB — I-STAT TROPONIN, ED
TROPONIN I, POC: 0.04 ng/mL (ref 0.00–0.08)
Troponin i, poc: 0.02 ng/mL (ref 0.00–0.08)

## 2014-10-17 LAB — BASIC METABOLIC PANEL
ANION GAP: 11 (ref 5–15)
BUN: 27 mg/dL — ABNORMAL HIGH (ref 6–20)
CALCIUM: 8.5 mg/dL — AB (ref 8.9–10.3)
CO2: 23 mmol/L (ref 22–32)
Chloride: 101 mmol/L (ref 101–111)
Creatinine, Ser: 1.76 mg/dL — ABNORMAL HIGH (ref 0.61–1.24)
GFR calc Af Amer: 46 mL/min — ABNORMAL LOW (ref >60)
GFR calc non Af Amer: 40 mL/min — ABNORMAL LOW (ref >60)
Glucose, Bld: 218 mg/dL — ABNORMAL HIGH (ref 65–99)
POTASSIUM: 4.3 mmol/L (ref 3.5–5.1)
Sodium: 135 mmol/L (ref 135–145)

## 2014-10-17 LAB — TROPONIN I
TROPONIN I: 0.05 ng/mL — AB (ref <0.031)
TROPONIN I: 0.05 ng/mL — AB (ref <0.031)

## 2014-10-17 LAB — I-STAT CG4 LACTIC ACID, ED
Lactic Acid, Venous: 2.81 mmol/L (ref 0.5–2.0)
Lactic Acid, Venous: 1.35 mmol/L (ref 0.5–2.0)

## 2014-10-17 LAB — POC OCCULT BLOOD, ED: Fecal Occult Bld: POSITIVE — AB

## 2014-10-17 LAB — ABO/RH: ABO/RH(D): A POS

## 2014-10-17 MED ORDER — DIVALPROEX SODIUM 250 MG PO DR TAB
250.0000 mg | DELAYED_RELEASE_TABLET | Freq: Every day | ORAL | Status: DC
  Administered 2014-10-18 – 2014-10-22 (×5): 250 mg via ORAL
  Filled 2014-10-17 (×6): qty 1

## 2014-10-17 MED ORDER — ACETAMINOPHEN 325 MG PO TABS
650.0000 mg | ORAL_TABLET | Freq: Four times a day (QID) | ORAL | Status: DC | PRN
  Administered 2014-10-21: 650 mg via ORAL
  Filled 2014-10-17: qty 2

## 2014-10-17 MED ORDER — DIVALPROEX SODIUM 250 MG PO DR TAB
250.0000 mg | DELAYED_RELEASE_TABLET | Freq: Two times a day (BID) | ORAL | Status: DC
  Filled 2014-10-17: qty 2

## 2014-10-17 MED ORDER — TETANUS-DIPHTH-ACELL PERTUSSIS 5-2.5-18.5 LF-MCG/0.5 IM SUSP
0.5000 mL | Freq: Once | INTRAMUSCULAR | Status: AC
  Administered 2014-10-17: 0.5 mL via INTRAMUSCULAR
  Filled 2014-10-17: qty 0.5

## 2014-10-17 MED ORDER — TAMSULOSIN HCL 0.4 MG PO CAPS
0.4000 mg | ORAL_CAPSULE | Freq: Every day | ORAL | Status: DC
  Administered 2014-10-18 – 2014-10-22 (×5): 0.4 mg via ORAL
  Filled 2014-10-17 (×7): qty 1

## 2014-10-17 MED ORDER — SODIUM CHLORIDE 0.9 % IV BOLUS (SEPSIS)
1000.0000 mL | Freq: Once | INTRAVENOUS | Status: AC
  Administered 2014-10-17: 1000 mL via INTRAVENOUS

## 2014-10-17 MED ORDER — FENTANYL CITRATE (PF) 100 MCG/2ML IJ SOLN
50.0000 ug | Freq: Once | INTRAMUSCULAR | Status: AC
  Administered 2014-10-17: 50 ug via INTRAVENOUS
  Filled 2014-10-17: qty 2

## 2014-10-17 MED ORDER — FLUOXETINE HCL 20 MG PO CAPS
40.0000 mg | ORAL_CAPSULE | Freq: Every day | ORAL | Status: DC
  Administered 2014-10-18 – 2014-10-22 (×5): 40 mg via ORAL
  Filled 2014-10-17 (×6): qty 2

## 2014-10-17 MED ORDER — DIVALPROEX SODIUM 500 MG PO DR TAB
500.0000 mg | DELAYED_RELEASE_TABLET | Freq: Every day | ORAL | Status: DC
  Administered 2014-10-17 – 2014-10-21 (×5): 500 mg via ORAL
  Filled 2014-10-17 (×7): qty 1

## 2014-10-17 MED ORDER — METHOCARBAMOL 500 MG PO TABS
500.0000 mg | ORAL_TABLET | Freq: Once | ORAL | Status: AC
  Administered 2014-10-17: 500 mg via ORAL
  Filled 2014-10-17: qty 1

## 2014-10-17 MED ORDER — ONDANSETRON HCL 4 MG PO TABS: 4.0000 mg | ORAL_TABLET | Freq: Four times a day (QID) | ORAL | Status: DC | PRN

## 2014-10-17 MED ORDER — OXYCODONE HCL 5 MG PO TABS
30.0000 mg | ORAL_TABLET | Freq: Every day | ORAL | Status: DC | PRN
  Administered 2014-10-17 – 2014-10-22 (×13): 30 mg via ORAL
  Filled 2014-10-17 (×13): qty 6

## 2014-10-17 MED ORDER — ATORVASTATIN CALCIUM 20 MG PO TABS
20.0000 mg | ORAL_TABLET | Freq: Every day | ORAL | Status: DC
  Administered 2014-10-17 – 2014-10-21 (×5): 20 mg via ORAL
  Filled 2014-10-17 (×6): qty 1

## 2014-10-17 MED ORDER — ONDANSETRON HCL 4 MG/2ML IJ SOLN: 4.0000 mg | Freq: Four times a day (QID) | INTRAMUSCULAR | Status: DC | PRN

## 2014-10-17 MED ORDER — METHOCARBAMOL 500 MG PO TABS
500.0000 mg | ORAL_TABLET | Freq: Four times a day (QID) | ORAL | Status: DC | PRN
  Administered 2014-10-17 – 2014-10-18 (×2): 500 mg via ORAL
  Filled 2014-10-17 (×2): qty 1

## 2014-10-17 MED ORDER — ENOXAPARIN SODIUM 40 MG/0.4ML ~~LOC~~ SOLN
40.0000 mg | SUBCUTANEOUS | Status: DC
  Administered 2014-10-17 – 2014-10-20 (×4): 40 mg via SUBCUTANEOUS
  Filled 2014-10-17 (×5): qty 0.4

## 2014-10-17 MED ORDER — SODIUM CHLORIDE 0.9 % IV SOLN: INTRAVENOUS | Status: AC

## 2014-10-17 MED ORDER — CLONAZEPAM 1 MG PO TABS
1.0000 mg | ORAL_TABLET | Freq: Every evening | ORAL | Status: DC | PRN
  Administered 2014-10-17 – 2014-10-19 (×2): 1 mg via ORAL
  Filled 2014-10-17 (×2): qty 1

## 2014-10-17 MED ORDER — SODIUM CHLORIDE 0.9 % IV SOLN
INTRAVENOUS | Status: DC
  Administered 2014-10-17 – 2014-10-21 (×4): via INTRAVENOUS

## 2014-10-17 MED ORDER — ARIPIPRAZOLE 5 MG PO TABS
5.0000 mg | ORAL_TABLET | Freq: Every day | ORAL | Status: DC
  Administered 2014-10-17 – 2014-10-21 (×5): 5 mg via ORAL
  Filled 2014-10-17 (×6): qty 1

## 2014-10-17 MED ORDER — ALUM & MAG HYDROXIDE-SIMETH 200-200-20 MG/5ML PO SUSP: 30.0000 mL | Freq: Four times a day (QID) | ORAL | Status: DC | PRN

## 2014-10-17 MED ORDER — GABAPENTIN 400 MG PO CAPS
800.0000 mg | ORAL_CAPSULE | Freq: Four times a day (QID) | ORAL | Status: DC
  Administered 2014-10-17 – 2014-10-22 (×18): 800 mg via ORAL
  Filled 2014-10-17 (×21): qty 2

## 2014-10-17 MED ORDER — ACETAMINOPHEN 650 MG RE SUPP: 650.0000 mg | Freq: Four times a day (QID) | RECTAL | Status: DC | PRN

## 2014-10-17 MED ORDER — FENTANYL CITRATE (PF) 100 MCG/2ML IJ SOLN
25.0000 ug | INTRAMUSCULAR | Status: DC | PRN
Start: 2014-10-17 — End: 2014-10-21
  Administered 2014-10-18 (×2): 50 ug via INTRAVENOUS
  Administered 2014-10-19 (×3): 25 ug via INTRAVENOUS
  Administered 2014-10-20 (×2): 50 ug via INTRAVENOUS
  Filled 2014-10-17 (×7): qty 2

## 2014-10-17 MED ORDER — TRAZODONE HCL 100 MG PO TABS
100.0000 mg | ORAL_TABLET | Freq: Every day | ORAL | Status: DC
  Administered 2014-10-17 – 2014-10-21 (×5): 100 mg via ORAL
  Filled 2014-10-17 (×6): qty 1
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
## 2014-10-17 NOTE — ED Notes (Signed)
Patient transported to X-ray 

## 2014-10-17 NOTE — ED Notes (Signed)
Pt remains monitored by blood pressure, pulse ox, and 5 lead.
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
## 2014-10-17 NOTE — ED Notes (Signed)
Transporting patient to new room assignment. 

## 2014-10-17 NOTE — ED Notes (Signed)
Ortho Doctor at bedside.

## 2014-10-17 NOTE — ED Notes (Signed)
Pt placed into gown and on monitor upon arrival to room. Pt monitored by blood pressure, pulse ox, and 12 lead. Pts EKG given to and signed by Dr. Winfred Leeds.

## 2014-10-17 NOTE — ED Notes (Signed)
Admit Doctor at bedside.  

## 2014-10-17 NOTE — ED Notes (Signed)
Given [place] meal to patient.
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
## 2014-10-18 DIAGNOSIS — N179 Acute kidney failure, unspecified: Secondary | ICD-10-CM

## 2014-10-18 DIAGNOSIS — R195 Other fecal abnormalities: Secondary | ICD-10-CM

## 2014-10-18 DIAGNOSIS — I9589 Other hypotension: Secondary | ICD-10-CM

## 2014-10-18 DIAGNOSIS — Z7901 Long term (current) use of anticoagulants: Secondary | ICD-10-CM

## 2014-10-18 DIAGNOSIS — R55 Syncope and collapse: Secondary | ICD-10-CM

## 2014-10-18 DIAGNOSIS — D62 Acute posthemorrhagic anemia: Secondary | ICD-10-CM

## 2014-10-18 HISTORY — DX: Acute kidney failure, unspecified: N17.9

## 2014-10-18 LAB — CBC
HCT: 20.4 % — ABNORMAL LOW (ref 39.0–52.0)
Hemoglobin: 7.2 g/dL — ABNORMAL LOW (ref 13.0–17.0)
MCH: 31.4 pg (ref 26.0–34.0)
MCHC: 35.3 g/dL (ref 30.0–36.0)
MCV: 89.1 fL (ref 78.0–100.0)
Platelets: 128 10*3/uL — ABNORMAL LOW (ref 150–400)
RBC: 2.29 MIL/uL — ABNORMAL LOW (ref 4.22–5.81)
RDW: 13.7 % (ref 11.5–15.5)
WBC: 3.8 10*3/uL — ABNORMAL LOW (ref 4.0–10.5)

## 2014-10-18 LAB — IRON AND TIBC
IRON: 50 ug/dL (ref 45–182)
Saturation Ratios: 27 % (ref 17.9–39.5)
TIBC: 186 ug/dL — ABNORMAL LOW (ref 250–450)
UIBC: 136 ug/dL

## 2014-10-18 LAB — BASIC METABOLIC PANEL
ANION GAP: 7 (ref 5–15)
BUN: 22 mg/dL — AB (ref 6–20)
CHLORIDE: 105 mmol/L (ref 101–111)
CO2: 26 mmol/L (ref 22–32)
Calcium: 8.2 mg/dL — ABNORMAL LOW (ref 8.9–10.3)
Creatinine, Ser: 1.16 mg/dL (ref 0.61–1.24)
GFR calc Af Amer: >60 mL/min (ref >60)
GLUCOSE: 133 mg/dL — AB (ref 65–99)
POTASSIUM: 3.8 mmol/L (ref 3.5–5.1)
Sodium: 138 mmol/L (ref 135–145)

## 2014-10-18 LAB — RETICULOCYTES
RBC.: 2.29 MIL/uL — AB (ref 4.22–5.81)
Retic Count, Absolute: 29.8 10*3/uL (ref 19.0–186.0)
Retic Ct Pct: 1.3 % (ref 0.4–3.1)

## 2014-10-18 LAB — FERRITIN: Ferritin: 147 ng/mL (ref 24–336)

## 2014-10-18 LAB — TROPONIN I: Troponin I: 0.06 ng/mL — ABNORMAL HIGH (ref <0.031)

## 2014-10-18 LAB — FOLATE: FOLATE: 10.1 ng/mL (ref >5.9)

## 2014-10-18 LAB — VITAMIN B12: Vitamin B-12: 855 pg/mL (ref 180–914)

## 2014-10-18 MED ORDER — PANTOPRAZOLE SODIUM 40 MG PO TBEC
40.0000 mg | DELAYED_RELEASE_TABLET | Freq: Two times a day (BID) | ORAL | Status: DC
  Administered 2014-10-18 – 2014-10-20 (×5): 40 mg via ORAL
  Filled 2014-10-18 (×4): qty 1
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
## 2014-10-18 NOTE — Progress Notes (Signed)
Orthopedic Tech Progress Note Patient Details:  Travis Mccuiston Sr. 02-04-1951 [id number]  Ortho Devices Type of Ortho Device: Postop shoe/boot Ortho Device/Splint Interventions: Application   Cammer, Theodoro Parma 10/18/2014, 1:58 AM

## 2014-10-19 ENCOUNTER — Inpatient Hospital Stay (HOSPITAL_COMMUNITY): Payer: Medicare PPO

## 2014-10-19 ENCOUNTER — Encounter (HOSPITAL_COMMUNITY): Payer: Self-pay | Admitting: *Deleted

## 2014-10-19 ENCOUNTER — Encounter (HOSPITAL_COMMUNITY)
Admission: EM | Disposition: A | Discharge status: Home / Self Care | Payer: Self-pay | Source: Home / Self Care | Attending: Internal Medicine

## 2014-10-19 ENCOUNTER — Other Ambulatory Visit (HOSPITAL_COMMUNITY): Payer: Self-pay

## 2014-10-19 DIAGNOSIS — R55 Syncope and collapse: Secondary | ICD-10-CM

## 2014-10-19 DIAGNOSIS — D649 Anemia, unspecified: Principal | ICD-10-CM

## 2014-10-19 HISTORY — PX: ESOPHAGOGASTRODUODENOSCOPY: SHX5428

## 2014-10-19 LAB — BASIC METABOLIC PANEL
Anion gap: 5 (ref 5–15)
BUN: 8 mg/dL (ref 6–20)
CHLORIDE: 105 mmol/L (ref 101–111)
CO2: 26 mmol/L (ref 22–32)
Calcium: 7.7 mg/dL — ABNORMAL LOW (ref 8.9–10.3)
Creatinine, Ser: 0.92 mg/dL (ref 0.61–1.24)
GFR calc Af Amer: >60 mL/min (ref >60)
Glucose, Bld: 93 mg/dL (ref 65–99)
POTASSIUM: 3.9 mmol/L (ref 3.5–5.1)
SODIUM: 136 mmol/L (ref 135–145)

## 2014-10-19 LAB — CBC
HEMATOCRIT: 19 % — AB (ref 39.0–52.0)
Hemoglobin: 6.4 g/dL — CL (ref 13.0–17.0)
MCH: 30.5 pg (ref 26.0–34.0)
MCHC: 33.7 g/dL (ref 30.0–36.0)
MCV: 90.5 fL (ref 78.0–100.0)
PLATELETS: 120 10*3/uL — AB (ref 150–400)
RBC: 2.1 MIL/uL — ABNORMAL LOW (ref 4.22–5.81)
RDW: 13.8 % (ref 11.5–15.5)
WBC: 3.2 10*3/uL — AB (ref 4.0–10.5)

## 2014-10-19 LAB — PREPARE RBC (CROSSMATCH)

## 2014-10-19 LAB — TSH: TSH: 2.152 u[IU]/mL (ref 0.350–4.500)

## 2014-10-19 LAB — GLUCOSE, CAPILLARY: GLUCOSE-CAPILLARY: 88 mg/dL (ref 65–99)

## 2014-10-19 SURGERY — EGD (ESOPHAGOGASTRODUODENOSCOPY)
Anesthesia: Moderate Sedation

## 2014-10-19 MED ORDER — SODIUM CHLORIDE 0.9 % IV SOLN
Freq: Once | INTRAVENOUS | Status: AC
  Administered 2014-10-19: 19:00:00 via INTRAVENOUS

## 2014-10-19 MED ORDER — DIPHENHYDRAMINE HCL 50 MG/ML IJ SOLN
INTRAMUSCULAR | Status: DC | PRN
  Administered 2014-10-19: 25 mg via INTRAVENOUS

## 2014-10-19 MED ORDER — DIPHENHYDRAMINE HCL 50 MG/ML IJ SOLN
INTRAMUSCULAR | Status: AC
  Filled 2014-10-19: qty 1

## 2014-10-19 MED ORDER — BUTAMBEN-TETRACAINE-BENZOCAINE 2-2-14 % EX AERO
INHALATION_SPRAY | CUTANEOUS | Status: DC | PRN
  Administered 2014-10-19: 2 via TOPICAL

## 2014-10-19 MED ORDER — SODIUM CHLORIDE 0.9 % IV SOLN
INTRAVENOUS | Status: DC
  Administered 2014-10-19: 500 mL via INTRAVENOUS

## 2014-10-19 MED ORDER — MIDAZOLAM HCL 5 MG/ML IJ SOLN
INTRAMUSCULAR | Status: AC
  Filled 2014-10-19: qty 2

## 2014-10-19 MED ORDER — MIDAZOLAM HCL 10 MG/2ML IJ SOLN
INTRAMUSCULAR | Status: DC | PRN
  Administered 2014-10-19 (×2): 2 mg via INTRAVENOUS
  Administered 2014-10-19: 1 mg via INTRAVENOUS
  Administered 2014-10-19: 2 mg via INTRAVENOUS

## 2014-10-19 MED ORDER — FENTANYL CITRATE (PF) 100 MCG/2ML IJ SOLN
INTRAMUSCULAR | Status: AC
  Filled 2014-10-19: qty 2

## 2014-10-19 MED ORDER — FENTANYL CITRATE (PF) 100 MCG/2ML IJ SOLN
INTRAMUSCULAR | Status: DC | PRN
  Administered 2014-10-19 (×2): 25 ug via INTRAVENOUS
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
## 2014-10-19 NOTE — Progress Notes (Signed)
CRITICAL VALUE ALERT  Critical value received:  Hgb 6.4  Date of notification:  10/19/2014  Time of notification:  1808  Critical value read back:Yes.    Nurse who received alert:  Marijean Heath, BSN, RN  MD notified (1st page):  Dr. Carles Collet  Time of first page:  1812- verbally   MD notified (2nd page):  Time of second page:  Responding MD:  Dr. Carles Collet   Time MD responded:  [phone]

## 2014-10-19 NOTE — Progress Notes (Signed)
  Echocardiogram 2D Echocardiogram has been performed.  Travis [name] 10/19/2014, 12:46 PM

## 2014-10-19 NOTE — Op Note (Signed)
[facility] [place], [id number]   ENDOSCOPY PROCEDURE REPORT  PATIENT: Travis, [name]  MR#: [id number] BIRTHDATE: July 11, 1950 , 84  yrs. old GENDER: male ENDOSCOPIST: Jerene Bears, MD REFERRED BY:  [facility] PROCEDURE DATE:  10/19/2014 PROCEDURE:  EGD, diagnostic ASA CLASS:     Class III INDICATIONS:  anemia and heme positive stool. MEDICATIONS: Benadryl 25 mg IV, Fentanyl 50 mcg IV, and Versed 7 mg IV TOPICAL ANESTHETIC: Cetacaine Spray  DESCRIPTION OF PROCEDURE: After the risks benefits and alternatives of the procedure were thoroughly explained, informed consent was obtained.  The PENTAX GASTROSCOPE [id number] endoscope was introduced through the mouth and advanced to the second portion of the duodenum , Without limitations.  The instrument was slowly withdrawn as the mucosa was fully examined.   ESOPHAGUS: The mucosa of the esophagus appeared normal.  STOMACH: Erosive gastritis (inflammation) with scant adherent heme was found in the gastric antrum and prepyloric region stomach. There was a small amount of residual food seen in the gastric fundus.  Due to the residual food, complete mucosal examination could not be performed in the fundus (other views adequate). Suspect mild gastroparesis in the setting of chronic narcotic use.   DUODENUM: The duodenal mucosa showed no abnormalities in the bulb and 2nd part of the duodenum.  Retroflexed views revealed as previously described.     The scope was then withdrawn from the patient and the procedure completed.  COMPLICATIONS: There were no immediate complications.     ENDOSCOPIC IMPRESSION: 1.   The mucosa of the esophagus appeared normal 2.   Erosive gastritis (inflammation) was found in the gastric antrum and prepyloric region stomach 3.   Food residue in the gastric fundus, suspect mild narcotic-related gastroparesis 4.   The duodenal mucosa showed no abnormalities  in the bulb and 2nd part of the duodenum  RECOMMENDATIONS: 1.  Daily PPI 2.  Check H.  Pylori Ab and treat if positive 3.  Avoid NSAIDs when possible 4.  Outpatient colonoscopy with approval from cardiology to hold Effient for 7 days prior to procedure 5.  Monitor Hgb  eSigned:  Jerene Bears, MD 10/19/2014 10:05 AM    CC: the patient  PATIENT NAME:  Travis, [name] MR#: [id number]

## 2014-10-20 ENCOUNTER — Encounter: Payer: Self-pay | Admitting: Physical Therapy

## 2014-10-20 ENCOUNTER — Ambulatory Visit: Payer: Medicare PPO | Admitting: Physician Assistant

## 2014-10-20 ENCOUNTER — Encounter (HOSPITAL_COMMUNITY): Payer: Self-pay | Admitting: Internal Medicine

## 2014-10-20 LAB — PROTIME-INR
INR: 1.2 (ref 0.00–1.49)
PROTHROMBIN TIME: 15.3 s — AB (ref 11.6–15.2)

## 2014-10-20 LAB — CBC
HCT: 20.8 % — ABNORMAL LOW (ref 39.0–52.0)
Hemoglobin: 7.3 g/dL — ABNORMAL LOW (ref 13.0–17.0)
MCH: 30.9 pg (ref 26.0–34.0)
MCHC: 35.1 g/dL (ref 30.0–36.0)
MCV: 88.1 fL (ref 78.0–100.0)
PLATELETS: 109 10*3/uL — AB (ref 150–400)
RBC: 2.36 MIL/uL — ABNORMAL LOW (ref 4.22–5.81)
RDW: 14.8 % (ref 11.5–15.5)
WBC: 3.1 10*3/uL — ABNORMAL LOW (ref 4.0–10.5)

## 2014-10-20 LAB — HIV ANTIBODY (ROUTINE TESTING W REFLEX): HIV SCREEN 4TH GENERATION: NONREACTIVE

## 2014-10-20 LAB — FIBRINOGEN: FIBRINOGEN: 357 mg/dL (ref 204–475)

## 2014-10-20 LAB — APTT: aPTT: 32 seconds (ref 24–37)

## 2014-10-20 MED ORDER — SODIUM CHLORIDE 0.9 % IV BOLUS (SEPSIS): 500.0000 mL | Freq: Once | INTRAVENOUS | Status: DC

## 2014-10-20 MED ORDER — BISACODYL 5 MG PO TBEC: 10.0000 mg | DELAYED_RELEASE_TABLET | Freq: Once | ORAL | Status: DC

## 2014-10-20 MED ORDER — PANTOPRAZOLE SODIUM 40 MG PO TBEC
40.0000 mg | DELAYED_RELEASE_TABLET | Freq: Every day | ORAL | Status: DC
  Administered 2014-10-21 – 2014-10-22 (×2): 40 mg via ORAL
  Filled 2014-10-20 (×2): qty 1
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
## 2014-10-20 NOTE — Clinical Documentation Improvement (Addendum)
Query #1 For coding purposes, cause and effect relationships cannot be assumed and must be documented by the attending MD. Patient has a diagnosis of anemia.  EGD 10/19/14 showed erosive gastritis, food residue in the fundus.  CBC's this admission noted below.  Please document the probable, likely or known cause of the patient's anemia in the progress notes and discharge summary.  Please also include the acuity and type of anemia. Component     Latest Ref Rng 10/17/2014 10/17/2014 10/18/2014 10/19/2014         5:25 PM  8:42 PM    Hemoglobin     13.0 - 17.0 g/dL 8.2 (L) 8.3 (L) 7.2 (L) 6.4 (LL)  HCT     39.0 - 52.0 % 23.5 (L) 23.4 (L) 20.4 (L) 19.0 (L)  Platelets     150 - 400 K/uL 138 (L) 154 128 (L) 120 (L)    Query #2 Patient also has a diagnosis of Syncope, which is considered a symptom code.  Please document the probable, likely or known cause of the patient's syncope in the progress notes and discharge summary.  Thank You, Erling Conte ,RN Clinical Documentation Specialist:  [phone] [place] Information Management

## 2014-10-20 NOTE — Progress Notes (Signed)
Daily Rounding Note  10/20/2014, 12:08 PM  LOS: 3 days   SUBJECTIVE:       No BMs. No complaints of shortness of breath, chest pain, abdominal pain. Patient ready to have his colonoscopy on Thursday.   OBJECTIVE:         Vital signs in last 24 hours:    Temp:  [97.6 F (36.4 C)-98.9 F (37.2 C)] 98.4 F (36.9 C) (05/31 0858) Pulse Rate:  [18-76] 74 (05/31 0858) Resp:  [16-18] 18 (05/31 0858) BP: (73-119)/(34-53) 119/50 mmHg (05/31 0858) SpO2:  [97 %-100 %] 98 % (05/31 0858) Last BM Date: 10/19/14 Filed Weights   10/17/14 1633 10/17/14 2108 10/18/14 2024  Weight: 214 lb (97.07 kg) 223 lb (101.152 kg) 238 lb 9.6 oz (108.228 kg)   General: Pleasant, alert, comfortable.   Heart: RRR. S1/S2 audible. Chest: Clear bilaterally. Nonlabored breathing. Abdomen: Soft, NT, ND.  No mass or HSM  Extremities: + LE edema. Very large hematoma emanating from the right hip into the buttock and right thigh.  Right foot walking type fixation device in place. Neuro/Psych:  Pleasant, oriented 3. Appropriate.  Intake/Output from previous day: 05/30 0701 - 05/31 0700 In: 1920 [P.O.:840; I.V.:400; Blood:680] Out: 1850 [Urine:1850]  Intake/Output this shift: Total I/O In: 480 [P.O.:480] Out: 950 [Urine:950]  Lab Results:  Recent Labs  10/18/14 0930 10/19/14 1725 10/20/14 0811  WBC 3.8* 3.2* 3.1*  HGB 7.2* 6.4* 7.3*  HCT 20.4* 19.0* 20.8*  PLT 128* 120* 109*   BMET  Recent Labs  10/17/14 1725 10/18/14 0930 10/19/14 1725  NA 135 138 136  K 4.3 3.8 3.9  CL 101 105 105  CO2 23 26 26   GLUCOSE 218* 133* 93  BUN 27* 22* 8  CREATININE 1.76* 1.16 0.92  CALCIUM 8.5* 8.2* 7.7*   LFT No results for input(s): PROT, ALBUMIN, AST, ALT, ALKPHOS, BILITOT, BILIDIR, IBILI in the last 72 hours. PT/INR  Recent Labs  10/20/14 0811  LABPROT 15.3*  INR 1.20   Hepatitis Panel No results for input(s): HEPBSAG, HCVAB, HEPAIGM,  HEPBIGM in the last 72 hours.  Studies/Results: No results found.   Scheduled Meds: . ARIPiprazole  5 mg Oral QHS  . atorvastatin  20 mg Oral QHS  . divalproex  250 mg Oral Daily   And  . divalproex  500 mg Oral QHS  . enoxaparin (LOVENOX) injection  40 mg Subcutaneous Q24H  . FLUoxetine  40 mg Oral Daily  . gabapentin  800 mg Oral QID  . pantoprazole  40 mg Oral BID  . tamsulosin  0.4 mg Oral QPC breakfast  . traZODone  100 mg Oral QHS   Continuous Infusions: . sodium chloride 125 mL/hr at 10/20/14 0735   PRN Meds:.acetaminophen **OR** acetaminophen, alum & mag hydroxide-simeth, clonazePAM, fentaNYL (SUBLIMAZE) injection, methocarbamol, ondansetron **OR** ondansetron (ZOFRAN) IV, oxycodone   ASSESMENT:   *  Anemia, FOBT +.  Acute on chronic. Started on po iron by PMD 08/2014.  S/p PRBC x 2 this AM.  5/30 EGD: erosive esophagitis, retained gastric food/suspect mild narcotic-related gastroparesis. H pylori:  2005 colonoscopy:and EGD "normal",in [place].. study report not in Epic.  The very large right hip hematoma, resulting from his recent fall, is certainly playing a large hole in his current acute anemia.  *  CAD, s/p previous stenting/multiple PCIs.  Effient at home, on hold.   *  Thrombocytopenia.   PLAN   *  Colonoscopy 6/2.  Start  clears in AM.  Will need to hold Lovenox beforehand.  CBC in AM.      Azucena Freed  10/20/2014, 12:08 PM Pager: [phone]  Attending MD note:   I have taken a history, examined the patient, and reviewed the chart. I agree with the Advanced Practitioner's impression and recommendations. If he is discharged before 6/2 he will be set up for outpatient colon, otherwise he will prep on 10/21/2014  [facility] Pager # [phone]

## 2014-10-21 ENCOUNTER — Encounter: Payer: Self-pay | Admitting: Physical Therapy

## 2014-10-21 DIAGNOSIS — N179 Acute kidney failure, unspecified: Secondary | ICD-10-CM

## 2014-10-21 LAB — TYPE AND SCREEN
ABO/RH(D): A POS
Antibody Screen: NEGATIVE
UNIT DIVISION: 0
Unit division: 0

## 2014-10-21 LAB — CBC
HCT: 19.6 % — ABNORMAL LOW (ref 39.0–52.0)
Hemoglobin: 6.8 g/dL — CL (ref 13.0–17.0)
MCH: 31.1 pg (ref 26.0–34.0)
MCHC: 34.7 g/dL (ref 30.0–36.0)
MCV: 89.5 fL (ref 78.0–100.0)
Platelets: 132 10*3/uL — ABNORMAL LOW (ref 150–400)
RBC: 2.19 MIL/uL — AB (ref 4.22–5.81)
RDW: 15.3 % (ref 11.5–15.5)
WBC: 3.2 10*3/uL — ABNORMAL LOW (ref 4.0–10.5)

## 2014-10-21 LAB — BASIC METABOLIC PANEL
ANION GAP: 7 (ref 5–15)
BUN: 6 mg/dL (ref 6–20)
CALCIUM: 8 mg/dL — AB (ref 8.9–10.3)
CO2: 28 mmol/L (ref 22–32)
CREATININE: 0.83 mg/dL (ref 0.61–1.24)
Chloride: 105 mmol/L (ref 101–111)
GFR calc non Af Amer: >60 mL/min (ref >60)
Glucose, Bld: 114 mg/dL — ABNORMAL HIGH (ref 65–99)
Potassium: 3.7 mmol/L (ref 3.5–5.1)
Sodium: 140 mmol/L (ref 135–145)

## 2014-10-21 LAB — PREPARE RBC (CROSSMATCH)

## 2014-10-21 LAB — H. PYLORI ANTIBODY, IGG

## 2014-10-21 LAB — CORTISOL-AM, BLOOD: CORTISOL - AM: 4.3 ug/dL — AB (ref 6.7–22.6)

## 2014-10-21 MED ORDER — SODIUM CHLORIDE 0.9 % IV SOLN
Freq: Once | INTRAVENOUS | Status: DC
  Administered 2014-10-21: 16:00:00 via INTRAVENOUS

## 2014-10-21 MED ORDER — SODIUM CHLORIDE 0.9 % IV SOLN
INTRAVENOUS | Status: DC
  Administered 2014-10-21: 20 mL/h via INTRAVENOUS

## 2014-10-21 MED ORDER — PEG-KCL-NACL-NASULF-NA ASC-C 100 G PO SOLR
1.0000 | Freq: Once | ORAL | Status: AC
  Administered 2014-10-22: 200 g via ORAL
  Filled 2014-10-21: qty 1

## 2014-10-21 MED ORDER — POLYETHYLENE GLYCOL 3350 17 G PO PACK
17.0000 g | PACK | Freq: Four times a day (QID) | ORAL | Status: AC
  Administered 2014-10-21 (×2): 17 g via ORAL
  Filled 2014-10-21 (×3): qty 1

## 2014-10-21 MED ORDER — SODIUM CHLORIDE 0.9 % IV SOLN
Freq: Once | INTRAVENOUS | Status: AC
  Administered 2014-10-21: 10:00:00 via INTRAVENOUS

## 2014-10-21 NOTE — Clinical Documentation Improvement (Signed)
  Patient has a diagnosis of Syncope, which is considered a symptom code.  When possible, please document the probable, likely or known cause of the patient's syncope in the progress notes and discharge summary.  Thank You, Erling Conte ,RN Clinical Documentation Specialist:  [phone] [facility]

## 2014-10-21 NOTE — Progress Notes (Signed)
Dr.Josephtexted this am re: hgb=6.8. Awaiting orders.

## 2014-10-21 NOTE — Progress Notes (Signed)
          Daily Rounding Note  10/21/2014, 10:11 AM  LOS: 4 days   SUBJECTIVE:       Last BM yesterday.  Feels well  OBJECTIVE:         Vital signs in last 24 hours:    Temp:  [98.4 F (36.9 C)-100 F (37.8 C)] 99.3 F (37.4 C) (06/01 0837) Pulse Rate:  [71-80] 75 (06/01 0837) Resp:  [17-18] 18 (06/01 0837) BP: (99-108)/(49-85) 99/51 mmHg (06/01 0837) SpO2:  [96 %-98 %] 98 % (06/01 0837) Last BM Date: 10/20/14 Filed Weights   10/17/14 1633 10/17/14 2108 10/18/14 2024  Weight: 214 lb (97.07 kg) 223 lb (101.152 kg) 238 lb 9.6 oz (108.228 kg)   General: looks well, comfortable   Heart: RRR Chest: clear bil.  No dyspnea or cough Abdomen: soft, NT, ND  Extremities: no CCE Neuro/Psych:  Pleasant, oriented x 3.  Alert.  No gross deficits.   Intake/Output from previous day: 05/31 0701 - 06/01 0700 In: 6451.3 [P.O.:1440; I.V.:5011.3] Out: 2851 [Urine:2850; Stool:1]  Intake/Output this shift: Total I/O In: 240 [P.O.:240] Out: 400 [Urine:400]  Lab Results:  Recent Labs  10/19/14 1725 10/20/14 0811 10/21/14 0615  WBC 3.2* 3.1* 3.2*  HGB 6.4* 7.3* 6.8*  HCT 19.0* 20.8* 19.6*  PLT 120* 109* 132*   BMET  Recent Labs  10/19/14 1725 10/21/14 0615  NA 136 140  K 3.9 3.7  CL 105 105  CO2 26 28  GLUCOSE 93 114*  BUN 8 6  CREATININE 0.92 0.83  CALCIUM 7.7* 8.0*   LFT No results for input(s): PROT, ALBUMIN, AST, ALT, ALKPHOS, BILITOT, BILIDIR, IBILI in the last 72 hours. PT/INR  Recent Labs  10/20/14 0811  LABPROT 15.3*  INR 1.20   Hepatitis Panel No results for input(s): HEPBSAG, HCVAB, HEPAIGM, HEPBIGM in the last 72 hours.  Studies/Results: No results found.  ASSESMENT:   * Anemia, FOBT +. Acute on chronic. Started on po iron by PMD 08/2014.  S/p PRBC x 2 5/31, Hgb dropped overnight.  1 additional unit ordered for today. .  5/30 EGD: erosive esophagitis, retained gastric food/suspect  mild narcotic-related gastroparesis. H pylori:  2005 colonoscopy:and EGD "normal",in [place].. study report not in Epic.  The very large right hip hematoma, resulting from his recent fall, is certainly playing a large hole in his current acute anemia.  * CAD, s/p previous stenting/multiple PCIs. Effient at home, on hold.   * Thrombocytopenia.     PLAN   *  Full dose move prep in AM tomorrow.  Give some doses miralax today.  Colonoscopy at 330 tomorrow.  Stopped Lovenox.   CBC in AM      Azucena Freed  10/21/2014, 10:11 AM Pager: [phone] Attending MD note:   I have taken a history, examined the patient, and reviewed the chart. I agree with the Advanced Practitioner's impression and recommendations. Stable H/H. Prepping for colonoscopy.  [facility] Pager # [phone]
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
## 2014-10-21 NOTE — Progress Notes (Signed)
1 unit of packed red blood cells ordered. Pre transfusion temp was 100.3 @ 1149. Was asked to give Tylenol, wait and recheck temp. Temp rechecked @ 1249 and was still 100.3. Dr.Joseph notified. To hold blood and recheck temp in 1 hour.

## 2014-10-21 NOTE — Progress Notes (Signed)
WRONG PT

## 2014-10-22 ENCOUNTER — Encounter (HOSPITAL_COMMUNITY)
Admission: EM | Disposition: A | Discharge status: Home / Self Care | Payer: Self-pay | Source: Home / Self Care | Attending: Internal Medicine

## 2014-10-22 ENCOUNTER — Ambulatory Visit: Payer: Medicare PPO | Admitting: Psychology

## 2014-10-22 ENCOUNTER — Encounter (HOSPITAL_COMMUNITY): Payer: Self-pay | Admitting: Physician Assistant

## 2014-10-22 DIAGNOSIS — S92901D Unspecified fracture of right foot, subsequent encounter for fracture with routine healing: Secondary | ICD-10-CM

## 2014-10-22 DIAGNOSIS — D120 Benign neoplasm of cecum: Secondary | ICD-10-CM

## 2014-10-22 DIAGNOSIS — D124 Benign neoplasm of descending colon: Secondary | ICD-10-CM

## 2014-10-22 HISTORY — PX: COLONOSCOPY: SHX5424

## 2014-10-22 LAB — BASIC METABOLIC PANEL
ANION GAP: 8 (ref 5–15)
BUN: 6 mg/dL (ref 6–20)
CALCIUM: 8.6 mg/dL — AB (ref 8.9–10.3)
CO2: 29 mmol/L (ref 22–32)
CREATININE: 0.72 mg/dL (ref 0.61–1.24)
Chloride: 103 mmol/L (ref 101–111)
GFR calc Af Amer: >60 mL/min (ref >60)
Glucose, Bld: 100 mg/dL — ABNORMAL HIGH (ref 65–99)
POTASSIUM: 4.6 mmol/L (ref 3.5–5.1)
Sodium: 140 mmol/L (ref 135–145)

## 2014-10-22 LAB — TYPE AND SCREEN
ABO/RH(D): A POS
Antibody Screen: NEGATIVE
UNIT DIVISION: 0
UNIT DIVISION: 0

## 2014-10-22 LAB — CBC
HCT: 25.8 % — ABNORMAL LOW (ref 39.0–52.0)
Hemoglobin: 9 g/dL — ABNORMAL LOW (ref 13.0–17.0)
MCH: 30.9 pg (ref 26.0–34.0)
MCHC: 34.9 g/dL (ref 30.0–36.0)
MCV: 88.7 fL (ref 78.0–100.0)
PLATELETS: 171 10*3/uL (ref 150–400)
RBC: 2.91 MIL/uL — ABNORMAL LOW (ref 4.22–5.81)
RDW: 15.1 % (ref 11.5–15.5)
WBC: 5.1 10*3/uL (ref 4.0–10.5)

## 2014-10-22 LAB — MRSA PCR SCREENING: MRSA by PCR: NEGATIVE

## 2014-10-22 LAB — HM COLONOSCOPY: HM COLON: ABNORMAL — AB

## 2014-10-22 SURGERY — COLONOSCOPY
Anesthesia: Moderate Sedation

## 2014-10-22 MED ORDER — FENTANYL CITRATE (PF) 100 MCG/2ML IJ SOLN
INTRAMUSCULAR | Status: DC | PRN
  Administered 2014-10-22 (×2): 25 ug via INTRAVENOUS

## 2014-10-22 MED ORDER — MIDAZOLAM HCL 5 MG/ML IJ SOLN
INTRAMUSCULAR | Status: AC
Start: 2014-10-22 — End: 2014-10-22
  Filled 2014-10-22: qty 2

## 2014-10-22 MED ORDER — DIPHENHYDRAMINE HCL 50 MG/ML IJ SOLN
INTRAMUSCULAR | Status: DC | PRN
Start: 2014-10-22 — End: 2014-10-22
  Administered 2014-10-22: 25 mg via INTRAVENOUS

## 2014-10-22 MED ORDER — PRASUGREL HCL 10 MG PO TABS: 10.0000 mg | ORAL_TABLET | Freq: Every day | ORAL | Status: DC

## 2014-10-22 MED ORDER — FENTANYL CITRATE (PF) 100 MCG/2ML IJ SOLN
INTRAMUSCULAR | Status: AC
Start: 2014-10-22 — End: 2014-10-22
  Filled 2014-10-22: qty 2

## 2014-10-22 MED ORDER — DIPHENHYDRAMINE HCL 50 MG/ML IJ SOLN
INTRAMUSCULAR | Status: AC
  Filled 2014-10-22: qty 1

## 2014-10-22 MED ORDER — PANTOPRAZOLE SODIUM 40 MG PO TBEC: 40.0000 mg | DELAYED_RELEASE_TABLET | Freq: Every day | ORAL | Status: DC

## 2014-10-22 MED ORDER — ASPIRIN 81 MG PO TABS: 81.0000 mg | ORAL_TABLET | Freq: Every day | ORAL | Status: DC

## 2014-10-22 MED ORDER — MIDAZOLAM HCL 5 MG/5ML IJ SOLN
INTRAMUSCULAR | Status: DC | PRN
  Administered 2014-10-22 (×2): 2 mg via INTRAVENOUS
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
## 2014-10-22 NOTE — Op Note (Signed)
[facility] [place], [address]   COLONOSCOPY PROCEDURE REPORT  PATIENT: Travis [name], Travis [name]  MR#: [id number] BIRTHDATE: Feb 06, 1951 , 62  yrs. old GENDER: male ENDOSCOPIST: Lafayette Dragon, MD REFERRED BY Dr Theda Belfast PROCEDURE DATE:  10/22/2014 PROCEDURE:   Colonoscopy, diagnostic, Colonoscopy with cold biopsy polypectomy, and Colonoscopy with snare polypectomy First Screening Colonoscopy - Avg.  risk and is 50 yrs.  old or older - No.  Prior Negative Screening - Now for repeat screening. 10 or more years since last screening  History of Adenoma - Now for follow-up colonoscopy & has been > or = to 3 yrs.  N/A  Polyps removed today? Yes ASA CLASS:   Class III INDICATIONS:Unexplained iron deficiency anemia, Colorectal Neoplasm Risk Assessment for this procedure is average risk, and syncopal episode, Hgb dropped to 7.1, last colon in [place] in 2005.  , EGD last week- erosive gastritis MEDICATIONS: Fentanyl 50 mcg IV, Versed 4 mg IV, and Benadryl 25 mg IV  DESCRIPTION OF PROCEDURE:   After the risks benefits and alternatives of the procedure were thoroughly explained, informed consent was obtained.  The digital rectal exam revealed no abnormalities of the rectum.   The Pentax Ped Colon [id number] endoscope was introduced through the anus and advanced to the cecum, which was identified by both the appendix and ileocecal valve. No adverse events experienced.   The quality of the prep was good.  (MiraLax was used)  The instrument was then slowly withdrawn as the colon was fully examined. Estimated blood loss is zero unless otherwise noted in this procedure report.There was a 10 mm sessile polyp on the ileo cecal valve which was removed with hot snare  and sent to pathology. There was a 3 mm sessile polyp in the cecal pouch, removed with cold biopsies and to pathology.. There was no blood in the entire colon. Mucosa of the colon  appeared normal.   The time to cecum = 7.3 Withdrawal time = 15.5   The scope was withdrawn and the procedure completed. COMPLICATIONS: There were no immediate complications.  ENDOSCOPIC IMPRESSION: 1. no evidence of active bleeding 2. nothing to account for severe anemia 3. 2 right colon polyps removed with hot snare and with cold biopsy  RECOMMENDATIONS: Await pathology results Effient to be resumed  when he sees his cardiologist- hold it till then Resume regular diet Recall colonoscopy pending Path report  eSigned:  Lafayette Dragon, MD 10/22/2014 5:21 PM   cc:   PATIENT NAME:  Travis [name], Travis [name] MR#: [id number]

## 2014-10-22 NOTE — Progress Notes (Signed)
Daily Rounding Note  10/22/2014, 11:26 AM  LOS: 5 days   SUBJECTIVE:       Stools clear post bowel prep.  No complaints.  Breathing is fine, no chest pain, no dizziness/weakness.   OBJECTIVE:         Vital signs in last 24 hours:    Temp:  [98 F (36.7 C)-100.3 F (37.9 C)] 98.6 F (37 C) (06/02 0943) Pulse Rate:  [58-73] 58 (06/02 0943) Resp:  [16-20] 18 (06/02 0943) BP: (88-140)/(41-72) 99/41 mmHg (06/02 0943) SpO2:  [94 %-100 %] 94 % (06/02 0943) Weight:  [240 lb 11.9 oz (109.2 kg)] 240 lb 11.9 oz (109.2 kg) (06/01 2156) Last BM Date: 10/20/14 Filed Weights   10/17/14 2108 10/18/14 2024 10/21/14 2156  Weight: 223 lb (101.152 kg) 238 lb 9.6 oz (108.228 kg) 240 lb 11.9 oz (109.2 kg)   Spoke with pt via phone.  Did not reexamine  Intake/Output from previous day: 06/01 0701 - 06/02 0700 In: 2475 [P.O.:1560; I.V.:550; Blood:365] Out: 3762 [Urine:4175]  Intake/Output this shift: Total I/O In: 0  Out: 500 [Urine:200; Stool:300]  Lab Results:  Recent Labs  10/20/14 0811 10/21/14 0615 10/22/14 0625  WBC 3.1* 3.2* 5.1  HGB 7.3* 6.8* 9.0*  HCT 20.8* 19.6* 25.8*  PLT 109* 132* 171   BMET  Recent Labs  10/19/14 1725 10/21/14 0615 10/22/14 0625  NA 136 140 140  K 3.9 3.7 4.6  CL 105 105 103  CO2 26 28 29   GLUCOSE 93 114* 100*  BUN 8 6 6   CREATININE 0.92 0.83 0.72  CALCIUM 7.7* 8.0* 8.6*   LFT No results for input(s): PROT, ALBUMIN, AST, ALT, ALKPHOS, BILITOT, BILIDIR, IBILI in the last 72 hours. PT/INR  Recent Labs  10/20/14 0811  LABPROT 15.3*  INR 1.20   Scheduled Meds: . ARIPiprazole  5 mg Oral QHS  . atorvastatin  20 mg Oral QHS  . bisacodyl  10 mg Oral Once  . divalproex  250 mg Oral Daily   And  . divalproex  500 mg Oral QHS  . FLUoxetine  40 mg Oral Daily  . gabapentin  800 mg Oral QID  . pantoprazole  40 mg Oral Daily  . tamsulosin  0.4 mg Oral QPC breakfast  . traZODone  100  mg Oral QHS   Continuous Infusions: . sodium chloride 50 mL/hr at 10/21/14 1232  . sodium chloride 20 mL/hr (10/21/14 2133)   PRN Meds:.acetaminophen **OR** acetaminophen, alum & mag hydroxide-simeth, clonazePAM, methocarbamol, ondansetron **OR** ondansetron (ZOFRAN) IV, oxycodone   ASSESMENT:   * Anemia, FOBT +. Acute on chronic. Started on po iron by PMD 08/2014.  S/p PRBC x 4 on 5/31-6/1   2005 colonoscopy and EGD in [place], "normal" 10/19/14 EGD: erosive esophagitis, retained gastric food/suspect mild narcotic-related gastroparesis. On once daily PO Protonix, none PTA.  H pylori Ab in negative range.  *  Extensive right hip hematoma, following recent fall, is certainly contributing significantly to acute anemia.  * CAD, s/p previous stenting/multiple PCIs. Effient and 81 ASA at home, on hold.   * Thrombocytopenia.    *  Hepatitis C?  On questioning pt re hepatitis "C" he gives hx of it being as a teenager when he was acutely ill.  May have been food borne. He thinks it was hepatitis A, not C.  No serologies found in Epic.   PLAN   *  Colonoscopy today.  *  Obtain hepatitis  serologies.     Azucena Freed  10/22/2014, 11:26 AM Pager: [phone]  Attending MD note:   I have taken a history, examined the patient, and reviewed the chart. I agree with the Advanced Practitioner's impression and recommendations. Please see colonoscopy report- nothing to account for severe anemia- suspect soft tissue hematoma may be the cause. Hold Effient till he sees his cardiologist.  [facility] Pager # [phone]
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
## 2014-10-23 ENCOUNTER — Encounter (HOSPITAL_COMMUNITY): Payer: Self-pay | Admitting: Physician Assistant

## 2014-10-23 LAB — HEPATITIS A ANTIBODY, TOTAL: HEP A TOTAL AB: NEGATIVE

## 2014-10-23 LAB — HEPATITIS B CORE ANTIBODY, TOTAL: Hep B Core Total Ab: NEGATIVE

## 2014-10-23 LAB — HEPATITIS B SURFACE ANTIGEN: Hepatitis B Surface Ag: NEGATIVE — AB

## 2014-10-23 LAB — HEPATITIS C ANTIBODY: HCV Ab: <0.1 s/co ratio — AB (ref 0.0–0.9)

## 2014-10-23 LAB — HEPATITIS B SURFACE ANTIBODY,QUALITATIVE: Hep B S Ab: NONREACTIVE

## 2014-10-26 ENCOUNTER — Encounter: Payer: Self-pay | Admitting: Internal Medicine

## 2014-10-26 ENCOUNTER — Encounter (HOSPITAL_COMMUNITY): Payer: Self-pay | Admitting: Internal Medicine

## 2014-10-29 ENCOUNTER — Ambulatory Visit: Payer: Medicare PPO | Admitting: Internal Medicine

## 2014-10-29 DIAGNOSIS — Z0289 Encounter for other administrative examinations: Secondary | ICD-10-CM

## 2014-11-04 ENCOUNTER — Ambulatory Visit (INDEPENDENT_AMBULATORY_CARE_PROVIDER_SITE_OTHER): Payer: Medicare PPO | Admitting: Psychiatry

## 2014-11-04 ENCOUNTER — Encounter (HOSPITAL_COMMUNITY): Payer: Self-pay | Admitting: Psychiatry

## 2014-11-04 VITALS — BP 130/70 | HR 60 | Ht 73.0 in | Wt 221.4 lb

## 2014-11-04 DIAGNOSIS — Z79899 Other long term (current) drug therapy: Secondary | ICD-10-CM

## 2014-11-04 DIAGNOSIS — F319 Bipolar disorder, unspecified: Secondary | ICD-10-CM

## 2014-11-04 DIAGNOSIS — F39 Unspecified mood [affective] disorder: Secondary | ICD-10-CM | POA: Diagnosis not present

## 2014-11-04 MED ORDER — FLUOXETINE HCL 40 MG PO CAPS: 40.0000 mg | ORAL_CAPSULE | Freq: Every day | ORAL | Status: DC

## 2014-11-04 MED ORDER — ARIPIPRAZOLE 5 MG PO TABS: 5.0000 mg | ORAL_TABLET | Freq: Every day | ORAL | Status: DC

## 2014-11-04 MED ORDER — TRAZODONE HCL 50 MG PO TABS: 50.0000 mg | ORAL_TABLET | Freq: Every day | ORAL | Status: DC

## 2014-11-04 NOTE — Progress Notes (Signed)
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 superficial bruises on his knee after he felt on the floor .  He maintained fair eye contact.  His speech is  slow but clear and coherent.  He described his mood tired and his affect is appropriate.  He denies any suicidal thoughts or homicidal thoughts.  He denies any auditory hallucination or visual hallucination.  There were no paranoia , delusions or any of the session.  He denies any tremors or shakes.  His fund of knowledge is adequate.   his fund of knowledge is average.  His attention and concentration is fair.  He is alert and oriented x3.  His insight judgment and impulse control is  fair.  Established Problem, Stable/Improving (1), New problem, with additional work up planned, Review of Psycho-Social Stressors (1), Review or order clinical lab tests (1), Review and summation of old records (2), New Problem, with no additional work-up planned (3), Review of Last Therapy Session (1), Review of Medication Regimen & Side Effects (2) and Review of New Medication or Change in Dosage (2)  Assessment: Axis I: Mood disorder NOS, rule out bipolar disorder depressed type  Axis II: Deferred  Axis III:  Past Medical History  Diagnosis Date  . HYPERLIPIDEMIA-MIXED 07/15/2009  . Chronic pain syndrome 10/27/2009    of ankle, shoulders, low back.  sciatica.   [name] HYPERTENSION 06/24/2009  . CORONARY ARTERY DISEASE 06/24/2009    a. s/p multiple PCIs - In 2008 he had a Taxus DES to the mild LAD, Endeavor DES to mid LCX and distal LCX. In January 2009 he had DES to distal LCX, mid LCX and proximal LCX. In November 2009 had BMS x 2 to the mid RCA. Cath 10/2011 with patent stents, noncardiac CP. LHC 01/2013: patent stents (noncardiac CP).  [name] URETHRAL STRICTURE 06/24/2009    self catheterizes.   [name] NEPHROLITHIASIS, HX OF 06/24/2009  . ERECTILE DYSFUNCTION, ORGANIC 05/30/2010  . Obesity   . ALLERGIC RHINITIS 06/24/2009  . Depression with anxiety     Prior suicide attempt  . Irregular heart beat   . Pneumonia   . Non-cardiac chest pain 10/2011, 01/2013  . Type II diabetes mellitus 2012    no meds in 09/2014.   [name] [place] DISEASE, LUMBAR 04/19/2010  . Arthritis     "all my joints" (09/30/2013)  . Fibromyalgia   . Myocardial infarction 2008  . OSA (obstructive sleep apnea)     not using CPAP (09/30/2013)  . Heart murmur    Plan:  I reviewed recent discharge summary, current medication and collateral information.  His Klonopin has been reduced and he is no longer taking blood pressure medication.  Patient was given been date after cleaning the superficial bleeding .  He has scratch on his skin due to the fall  when he tripped today.  Patient is feeling better.  However I had a long discussion with the patient about his polypharmacy and medication.  I recommended to decrease trazodone 50 mg since it can also contribute local hypotension .  I reviewed blood work results patient has bit loss and he is going to see a GI doctor very soon.  He is going to see his primary care physician next Tuesday.  I strongly encourage to discuss his medication with his primary care physician in detail.  I will continue Abilify 5 mg daily, Prozac 40 mg daily and reduce the trazodone to 50 mg at bedtime.  His Neurontin , Klonopin, Depakote as given by his primary care physician .  He is taking Neurontin 800 mg 4 times a day, Depakote 250 mg in the morning and 500 mg at bedtime and Klonopin 1  mg at bedtime.  Discussed medication side effects and benefits. Reinforce to see Dr. Cheryln Manly for counseling. Recommended to call us back if he has any question or any concern.  I will see him again in 3 months. Time spent 25 minutes.  More than 50% of the time spent in psychoeducation, counseling and coordination of care.  Discuss safety plan that anytime having active suicidal thoughts or homicidal thoughts then patient need to call 911 or go to the local emergency room.  Jacoria Keiffer T., MD 11/04/2014

## 2014-11-15 ENCOUNTER — Other Ambulatory Visit: Payer: Self-pay | Admitting: Adult Health

## 2014-11-15 ENCOUNTER — Other Ambulatory Visit: Payer: Self-pay | Admitting: Internal Medicine

## 2014-11-16 ENCOUNTER — Other Ambulatory Visit: Payer: Self-pay | Admitting: Internal Medicine

## 2014-11-16 ENCOUNTER — Other Ambulatory Visit: Payer: Self-pay

## 2014-11-18 ENCOUNTER — Encounter (HOSPITAL_COMMUNITY): Payer: Self-pay

## 2014-11-18 ENCOUNTER — Ambulatory Visit (HOSPITAL_COMMUNITY): Payer: Self-pay

## 2014-11-19 ENCOUNTER — Ambulatory Visit (HOSPITAL_COMMUNITY): Payer: Self-pay

## 2014-11-19 ENCOUNTER — Ambulatory Visit: Payer: Medicare PPO | Admitting: Physician Assistant

## 2014-11-20 ENCOUNTER — Telehealth: Payer: Self-pay | Admitting: Internal Medicine

## 2014-11-20 DIAGNOSIS — M5489 Other dorsalgia: Secondary | ICD-10-CM

## 2014-11-20 NOTE — Telephone Encounter (Signed)
Referral done

## 2014-11-20 NOTE — Telephone Encounter (Signed)
Butch Penny from [place] stated that patient need a referral for patient, he is there at the office. Please advise [phone]

## 2014-11-24 ENCOUNTER — Telehealth: Payer: Self-pay | Admitting: Internal Medicine

## 2014-11-24 NOTE — Telephone Encounter (Signed)
Butch Penny from [place] stated the patient referral was not online please advise [phone]

## 2014-11-26 ENCOUNTER — Other Ambulatory Visit (HOSPITAL_COMMUNITY): Payer: Self-pay | Admitting: Psychiatry

## 2014-11-26 NOTE — Telephone Encounter (Signed)
Advised Butch Penny @ [place] that pt does not need insurance referral. He has [employer] PPO.

## 2014-11-27 NOTE — Telephone Encounter (Signed)
Requested Depakote refill declined as patient receives this from his primary care provider.

## 2014-12-23 ENCOUNTER — Other Ambulatory Visit: Payer: Self-pay | Admitting: Cardiology

## 2014-12-24 ENCOUNTER — Telehealth: Payer: Self-pay | Admitting: *Deleted

## 2014-12-24 DIAGNOSIS — I2510 Atherosclerotic heart disease of native coronary artery without angina pectoris: Secondary | ICD-10-CM

## 2014-12-24 NOTE — Telephone Encounter (Signed)
Just wanted to verify that the patient should still be taking this as it is not on the snapshot, but I did not see where he was told to stop taking it. Please advise. Thanks, MI

## 2014-12-24 NOTE — Telephone Encounter (Signed)
Copied from Ermalinda Barrios office note 10/12/14:  Stable without chest pain. Patient has been out of Effient for 1 month. We'll give samples and new prescription.  12/24/14--pt should be taking Effient 65m daily

## 2014-12-24 NOTE — Telephone Encounter (Signed)
If no problems, would just continue it since he has been on it.  He should get a CBC, however.  If he has noted any blood in stool, etc, should stop Effient again.

## 2014-12-24 NOTE — Telephone Encounter (Signed)
Notes copied from 10/22/14 Discharge Summary from [facility]:  Recommendations for Outpatient Follow-up:  1. Dr.John in 1 week, please check CBC at FU and resume antihypertensives based on BP as tolerated 2. Resume ASA in 1 week 3. Stop Effient until seen by Cardiologist in Follow up 4.   12/24/14--these notes from the [facility] Discharge Summary in June 2016 indicate he should not be taking Effient until follow up with cardiology. Pt has an appt with Dr Aundra Dubin 01/11/15. Pt states that he did not know he was to stop taking Effient, he is currently taking Effient 51m daily.  I will forward to Dr MAundra Dubinto review for recommendations about Effient prescription.

## 2014-12-24 NOTE — Telephone Encounter (Signed)
Pt denies any blood in stool or other obvious signs of bleeding.  Pt states he will come for [facility] tomorrow, he has about a week of Effient before he needs a refill.

## 2014-12-24 NOTE — Addendum Note (Signed)
Addended by: Katrine Coho on: 12/24/2014 12:40 PM   Modules accepted: Orders, Medications

## 2014-12-24 NOTE — Telephone Encounter (Signed)
Copied from 10/22/14 Discharge Summary from [facility]:  Resume ASA in 1 week Stop Effient until seen by Cardiologist in Follow up  After further review of the patient's medical record Effient was stopped and ASA started when pt was discharged from [facility] 10/22/14, pt should not currently be taking Effient.

## 2014-12-25 ENCOUNTER — Other Ambulatory Visit (HOSPITAL_COMMUNITY): Payer: Self-pay | Admitting: *Deleted

## 2014-12-25 ENCOUNTER — Other Ambulatory Visit (INDEPENDENT_AMBULATORY_CARE_PROVIDER_SITE_OTHER): Payer: Medicare PPO | Admitting: *Deleted

## 2014-12-25 DIAGNOSIS — I2510 Atherosclerotic heart disease of native coronary artery without angina pectoris: Secondary | ICD-10-CM

## 2014-12-25 LAB — CBC WITH DIFFERENTIAL/PLATELET
Basophils Absolute: 0 10*3/uL (ref 0.0–0.1)
Basophils Relative: 0.5 % (ref 0.0–3.0)
EOS PCT: 4.6 % (ref 0.0–5.0)
Eosinophils Absolute: 0.2 10*3/uL (ref 0.0–0.7)
HCT: 34.4 % — ABNORMAL LOW (ref 39.0–52.0)
HEMOGLOBIN: 11.6 g/dL — AB (ref 13.0–17.0)
Lymphocytes Relative: 32.3 % (ref 12.0–46.0)
Lymphs Abs: 1.3 10*3/uL (ref 0.7–4.0)
MCHC: 33.6 g/dL (ref 30.0–36.0)
MCV: 92.7 fl (ref 78.0–100.0)
MONOS PCT: 8.1 % (ref 3.0–12.0)
Monocytes Absolute: 0.3 10*3/uL (ref 0.1–1.0)
NEUTROS ABS: 2.1 10*3/uL (ref 1.4–7.7)
Neutrophils Relative %: 54.5 % (ref 43.0–77.0)
Platelets: 269 10*3/uL (ref 150.0–400.0)
RBC: 3.71 Mil/uL — AB (ref 4.22–5.81)
RDW: 15 % (ref 11.5–15.5)
WBC: 3.9 10*3/uL — ABNORMAL LOW (ref 4.0–10.5)

## 2014-12-25 NOTE — Telephone Encounter (Signed)
Patient called asking for a refill of Depakote. Chart reviewed, refill not appropriate. Explained to patient that it had last been filled by another provider and that was 2015. Asked when he last had it and he stated a couple months. Let him know tht MD may require a valproic level be drawn before restarting medication. Pt states that he remembers Dr. Adele Schilder mentioning that. Also noticed that patient did not have a follow up appointment scheduled and explained that he needed to make one. Transferred him to the front desk for appointment.

## 2015-01-11 ENCOUNTER — Ambulatory Visit (INDEPENDENT_AMBULATORY_CARE_PROVIDER_SITE_OTHER): Payer: Medicare PPO | Admitting: Cardiology

## 2015-01-11 ENCOUNTER — Encounter: Payer: Self-pay | Admitting: Cardiology

## 2015-01-11 VITALS — BP 116/62 | HR 75 | Ht 73.0 in | Wt 252.8 lb

## 2015-01-11 DIAGNOSIS — I10 Essential (primary) hypertension: Secondary | ICD-10-CM

## 2015-01-11 DIAGNOSIS — I2510 Atherosclerotic heart disease of native coronary artery without angina pectoris: Secondary | ICD-10-CM | POA: Diagnosis not present

## 2015-01-11 MED ORDER — PANTOPRAZOLE SODIUM 40 MG PO TBEC: 40.0000 mg | DELAYED_RELEASE_TABLET | Freq: Every day | ORAL | Status: DC

## 2015-01-11 MED ORDER — PRASUGREL HCL 5 MG PO TABS: 5.0000 mg | ORAL_TABLET | Freq: Every day | ORAL | Status: DC
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
## 2015-01-11 NOTE — Patient Instructions (Signed)
Medication Instructions:  Decrease Effient to 13m daily.  Take protonix 443mdaily  Labwork: None today  Testing/Procedures: None today  Follow-Up: Your physician wants you to follow-up in: 6 months with Dr McAundra Dubin(February 2017).  You will receive a reminder letter in the mail two months in advance. If you don't receive a letter, please call our office to schedule the follow-up appointment.

## 2015-03-10 ENCOUNTER — Encounter (HOSPITAL_COMMUNITY): Payer: Self-pay | Admitting: Psychiatry

## 2015-03-10 ENCOUNTER — Ambulatory Visit (INDEPENDENT_AMBULATORY_CARE_PROVIDER_SITE_OTHER): Payer: Medicare PPO | Admitting: Psychiatry

## 2015-03-10 VITALS — BP 126/70 | HR 67 | Ht 73.0 in | Wt 267.6 lb

## 2015-03-10 DIAGNOSIS — F39 Unspecified mood [affective] disorder: Secondary | ICD-10-CM | POA: Diagnosis not present

## 2015-03-10 DIAGNOSIS — F319 Bipolar disorder, unspecified: Secondary | ICD-10-CM

## 2015-03-10 MED ORDER — FLUOXETINE HCL 40 MG PO CAPS: 40.0000 mg | ORAL_CAPSULE | Freq: Every day | ORAL | Status: DC

## 2015-03-10 MED ORDER — ARIPIPRAZOLE 5 MG PO TABS: 5.0000 mg | ORAL_TABLET | Freq: Every day | ORAL | Status: DC

## 2015-03-10 MED ORDER — TRAZODONE HCL 100 MG PO TABS: 100.0000 mg | ORAL_TABLET | Freq: Every day | ORAL | Status: DC

## 2015-03-10 NOTE — Progress Notes (Signed)
[place] [phone] Progress Note  Travis [name] [id number] 64 y.o.  03/10/2015 1:55 PM  Chief Complaint:  I am not taking Depakote.  My doctor did not give me refills.  I am only sleeping 3 hours.        History of Present Illness:  Travis [name] came for his followup appointment.  He is taking Abilify, trazodone and Prozac.  He has been not taking Depakote for past 2 months because it was not renewed by his primary care physician.  He called a few times but not sure why Depakote was not continued.  He admitted poor sleep and racing thoughts but denies any irritability, anger, mania or any psychosis.  He is taking all his other psychiatric medication.  He denies any more complaining of dizziness of fall.  His blood pressures are stable.  He is happy that his niece moved in with him.  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  He admitted weight gain in recent months because he has been not able to watch his calories.  But overall he described his mood is stable.  He denies any agitation, anger, crying spells, mood swing, paranoia or any hallucination.  Other than insomnia he has no issues.  He is taking pain medication at [facility].  Patient denies drinking or using any illegal substances.  He denies any shakes or tremors.  He has chronic health issues including chronic back pain.  He goes to Dr. Cheryln [name] for counseling.  He denies any EPS, tremors or shakes.  He saw a cardiologist in August.  His chemistry was normal.  Suicidal Ideation: No Plan Formed: No Patient has means to carry out plan: No  Homicidal Ideation: No Plan Formed: No Patient has means to carry out plan: No  Past Psychiatric History/Hospitalization(s) Patient has history of bipolar disorder and he is seeing psychiatrists since age 89.  He saw psychiatrist in [place] and [place].  He has history of severe mood swings , anger issues, hallucination, irritability and poor impulse control and  excessive spending. He married 5 times and he has 2 children but patient has no contact with them.He has one psychiatric hospitalization in 2014 when he took overdose on Xanax with alcohol.  He had at least history of 2 suicidal gesture in the past.  Patient also attended intensive outpatient program in the past.  He had tried Effexor, Xanax, Klonopin, Ambien , Lunesta, Wellbutrin, Ambien and Remeron.  He was started on Abilify from this office.  Anxiety: Yes Bipolar Disorder: Yes Depression: Yes Mania: Yes Psychosis: Yes Schizophrenia: No Personality Disorder: No Hospitalization for psychiatric illness: Yes History of Electroconvulsive Shock Therapy: No Prior Suicide Attempts: Yes  Medical History; Patient has hypertension, coronary artery disease, allergic rhinitis, type 2 diabetes mellitus, left leg pain, degenerative joint disease, chronic back pain, urethral stricture, and about his function, hyperlipidemia, nephrolithiasis and chronic fatigue.  His primary care physician is Dr. Kandee [name].  Review of Systems  Constitutional: Negative.  Negative for weight loss.  Cardiovascular: Negative for chest pain and palpitations.  Gastrointestinal: Negative for nausea and vomiting.  Musculoskeletal: Positive for back pain, joint pain and neck pain.  Skin: Negative for itching and rash.  Neurological: Negative for dizziness, tremors and headaches.  Psychiatric/Behavioral: Negative for suicidal ideas and substance abuse. The patient has insomnia.     Psychiatric: Agitation: No Hallucination: No Depressed Mood: No Insomnia: No Hypersomnia: No Altered Concentration: No Feels Worthless: No Grandiose Ideas: No Belief In  Special Powers: No New/Increased Substance Abuse: No Compulsions: No  Neurologic: Headache: No Seizure: No Paresthesias: Yes    Outpatient Encounter Prescriptions as of 03/10/2015  Medication Sig  . ARIPiprazole (ABILIFY) 5 MG tablet Take 1 tablet (5 mg total) by  mouth at bedtime.  [name] aspirin 81 MG tablet Take 1 tablet (81 mg total) by mouth daily. Resume in 1 week after FU with PCP  . ASSURE COMFORT LANCETS 30G MISC   . atorvastatin (LIPITOR) 20 MG tablet TAKE 1 TABLET BY MOUTH EVERY DAY  . Blood Glucose Calibration (EMBRACE CONTROL) LOW SOLN   . Blood Glucose Monitoring Suppl (EMBRACE BLOOD GLUCOSE MONITOR) DEVI   . clonazePAM (KLONOPIN) 1 MG tablet Take 1 tablet (1 mg total) by mouth at bedtime as needed for anxiety. (Patient taking differently: Take 1 mg by mouth at bedtime. )  . cyclobenzaprine (FLEXERIL) 5 MG tablet TK 1 T PO  TID  . EMBRACE BLOOD GLUCOSE TEST test strip   . ferrous gluconate (FERGON) 324 MG tablet Take 1 tablet (324 mg total) by mouth 3 (three) times daily with meals.  [name] FLUoxetine (PROZAC) 40 MG capsule Take 1 capsule (40 mg total) by mouth daily.  . fluticasone (FLONASE) 50 MCG/ACT nasal spray Place 1 spray into both nostrils daily as needed (congestion).   . gabapentin (NEURONTIN) 400 MG capsule Take 2 capsules (800 mg total) by mouth 4 (four) times daily.  Elmore Guise Devices (ADJUSTABLE LANCING DEVICE) MISC   . metFORMIN (GLUCOPHAGE) 500 MG tablet Take 500 mg by mouth 2 (two) times daily with a meal.  . Omega-3 Fatty Acids (FISH OIL) 1000 MG CAPS Take 1,000 mg by mouth daily.   [name] oxycodone (ROXICODONE) 30 MG immediate release tablet Take 30 mg by mouth 5 (five) times daily as needed for pain.   [name] oxymorphone (OPANA) 10 MG tablet TK 1 T PO D PRF PAIN FLARE  . pantoprazole (PROTONIX) 40 MG tablet Take 1 tablet (40 mg total) by mouth daily.  . prasugrel (EFFIENT) 5 MG TABS tablet Take 1 tablet (5 mg total) by mouth daily.  . sildenafil (VIAGRA) 50 MG tablet Take 1 tablet (50 mg total) by mouth daily as needed for erectile dysfunction.  . tamsulosin (FLOMAX) 0.4 MG CAPS capsule Take 0.4 mg by mouth daily after breakfast.  . traZODone (DESYREL) 100 MG tablet Take 1 tablet (100 mg total) by mouth at bedtime.  . [DISCONTINUED]  ARIPiprazole (ABILIFY) 5 MG tablet Take 1 tablet (5 mg total) by mouth at bedtime.  . [DISCONTINUED] divalproex (DEPAKOTE) 250 MG DR tablet Take 1-2 tablets (250-500 mg total) by mouth 2 (two) times daily. 250 mg in morning and 500 mg at bedtime (Patient taking differently: Take 250-500 mg by mouth 2 (two) times daily. Take 1 tablet (250 mg) every morning and 2 tablets (500 mg) at bedtime)  . [DISCONTINUED] FLUoxetine (PROZAC) 40 MG capsule Take 1 capsule (40 mg total) by mouth daily.  . [DISCONTINUED] methocarbamol (ROBAXIN) 500 MG tablet Take 1 tablet (500 mg total) by mouth every 6 (six) hours as needed for muscle spasms.  . [DISCONTINUED] tamsulosin (FLOMAX) 0.4 MG CAPS capsule TAKE 1 CAPSULE BY MOUTH DAILY AFTER BREAKFAST  . [DISCONTINUED] traZODone (DESYREL) 50 MG tablet Take 1 tablet (50 mg total) by mouth at bedtime.   No facility-administered encounter medications on file as of 03/10/2015.    Physical Exam: Constitutional:  BP 126/70 mmHg  Pulse 67  Ht 6' 1"  (1.854 m)  Wt 267 lb  9.6 oz (121.383 kg)  BMI 35.31 kg/m2 Recent Results (from the past 2160 hour(s))  CBC with Differential/Platelet     Status: Abnormal   Collection Time: 12/25/14 12:35 PM  Result Value Ref Range   WBC 3.9 (L) 4.0 - 10.5 K/uL   RBC 3.71 (L) 4.22 - 5.81 Mil/uL   Hemoglobin 11.6 (L) 13.0 - 17.0 g/dL   HCT 34.4 (L) 39.0 - 52.0 %   MCV 92.7 78.0 - 100.0 fl   MCHC 33.6 30.0 - 36.0 g/dL   RDW 15.0 11.5 - 15.5 %   Platelets 269.0 150.0 - 400.0 K/uL   Neutrophils Relative % 54.5 43.0 - 77.0 %   Lymphocytes Relative 32.3 12.0 - 46.0 %   Monocytes Relative 8.1 3.0 - 12.0 %   Eosinophils Relative 4.6 0.0 - 5.0 %   Basophils Relative 0.5 0.0 - 3.0 %   Neutro Abs 2.1 1.4 - 7.7 K/uL   Lymphs Abs 1.3 0.7 - 4.0 K/uL   Monocytes Absolute 0.3 0.1 - 1.0 K/uL   Eosinophils Absolute 0.2 0.0 - 0.7 K/uL   Basophils Absolute 0.0 0.0 - 0.1 K/uL     Musculoskeletal: Strength & Muscle Tone: decreased Gait & Station:  Difficulty walking due to pain Patient leans: Front  Mental Status Examination;  Patient is casually dressed and fairly groomed.  He maintained fair eye contact.  His speech is slow but clear and coherent.  He described his mood euthymic and his affect is appropriate.  He denies any suicidal thoughts or homicidal thoughts.  He denies any auditory hallucination or visual hallucination.  There were no paranoia , delusions or any of the session.  He denies any tremors or shakes.  His fund of knowledge is adequate.  He has difficulty walking because of chronic pain.  His fund of knowledge is average.  His attention and concentration is fair.  He is alert and oriented x3.  His insight judgment and impulse control is fair.  Established Problem, Stable/Improving (1), Review of Psycho-Social Stressors (1), Review or order clinical lab tests (1), Review and summation of old records (2), Review of Last Therapy Session (1), Review of Medication Regimen & Side Effects (2) and Review of New Medication or Change in Dosage (2)  Assessment: Axis I: Mood disorder NOS, rule out bipolar disorder depressed type  Axis II: Deferred  Axis III:  Past Medical History  Diagnosis Date  . HYPERLIPIDEMIA-MIXED 07/15/2009  . Chronic pain syndrome 10/27/2009    of ankle, shoulders, low back.  sciatica.   [name] HYPERTENSION 06/24/2009  . CORONARY ARTERY DISEASE 06/24/2009    a. s/p multiple PCIs - In 2008 he had a Taxus DES to the mild LAD, Endeavor DES to mid LCX and distal LCX. In January 2009 he had DES to distal LCX, mid LCX and proximal LCX. In November 2009 had BMS x 2 to the mid RCA. Cath 10/2011 with patent stents, noncardiac CP. LHC 01/2013: patent stents (noncardiac CP).  [name] URETHRAL STRICTURE 06/24/2009    self catheterizes.   [name] NEPHROLITHIASIS, HX OF 06/24/2009  . ERECTILE DYSFUNCTION, ORGANIC 05/30/2010  . Obesity   . ALLERGIC RHINITIS 06/24/2009  . Depression with anxiety     Prior suicide attempt  . Irregular heart beat   .  Pneumonia   . Non-cardiac chest pain 10/2011, 01/2013  . Type II diabetes mellitus ([place]) 2012    no meds in 09/2014.   [name] [place] DISEASE, LUMBAR 04/19/2010  . Arthritis     "  all my joints" (09/30/2013)  . Fibromyalgia   . Myocardial infarction ([place]) 2008  . OSA (obstructive sleep apnea)     not using CPAP (09/30/2013)  . Heart murmur    Plan:  I reviewed psychosocial stressors, current medication and recent blood work results.  Patient is no longer taking Depakote because it was not renewed by his primary care physician.  He is committing of poor sleep.  I recommended to increase trazodone 100 mg at bedtime.  He does not feel he needed Depakote at this time.  However he like to continue Abilify, Prozac .  Discussed medication side effects and benefits. Discussed polypharmacy and interaction with psychiatric medication.  His Neurontin , Klonopin is given by his primary care physician .  He is taking Neurontin 800 mg 4 times a day and Klonopin 1 mg at bedtime. Reinforce to see Dr. Cheryln [name] for counseling. Recommended to call us back if he has any question or any concern.  I will see him again in 3 months. Time spent 25 minutes.  More than 50% of the time spent in psychoeducation, counseling and coordination of care.  Discuss safety plan that anytime having active suicidal thoughts or homicidal thoughts then patient need to call 911 or go to the local emergency room.  Dorla Guizar T., MD 03/10/2015

## 2015-03-11 ENCOUNTER — Ambulatory Visit (INDEPENDENT_AMBULATORY_CARE_PROVIDER_SITE_OTHER): Payer: Medicare PPO | Admitting: Internal Medicine

## 2015-03-11 ENCOUNTER — Encounter: Payer: Self-pay | Admitting: Internal Medicine

## 2015-03-11 VITALS — BP 126/68 | HR 73 | Temp 98.8°F | Wt 267.0 lb

## 2015-03-11 DIAGNOSIS — Z0000 Encounter for general adult medical examination without abnormal findings: Secondary | ICD-10-CM | POA: Diagnosis not present

## 2015-03-11 DIAGNOSIS — E1151 Type 2 diabetes mellitus with diabetic peripheral angiopathy without gangrene: Secondary | ICD-10-CM | POA: Diagnosis not present

## 2015-03-11 DIAGNOSIS — IMO0002 Reserved for concepts with insufficient information to code with codable children: Secondary | ICD-10-CM

## 2015-03-11 DIAGNOSIS — E1165 Type 2 diabetes mellitus with hyperglycemia: Secondary | ICD-10-CM

## 2015-03-11 DIAGNOSIS — Z23 Encounter for immunization: Secondary | ICD-10-CM | POA: Diagnosis not present

## 2015-03-11 MED ORDER — METFORMIN HCL 500 MG PO TABS: 500.0000 mg | ORAL_TABLET | Freq: Two times a day (BID) | ORAL | Status: DC

## 2015-03-11 NOTE — Progress Notes (Signed)
Patient received education resource, including the self-management goal and tool. Patient verbalized understanding.

## 2015-03-11 NOTE — Assessment & Plan Note (Signed)
Overall doing well, age appropriate education and counseling updated, referrals for preventative services and immunizations addressed, dietary and smoking counseling addressed, most recent labs reviewed.  I have personally reviewed and have noted:  1) the patient's medical and social history 2) The pt's use of alcohol, tobacco, and illicit drugs 3) The patient's current medications and supplements 4) Functional ability including ADL's, fall risk, home safety risk, hearing and visual impairment 5) Diet and physical activities 6) Evidence for depression or mood disorder 7) The patient's height, weight, and BMI have been recorded in the chart  I have made referrals, and provided counseling and education based on review of the above For flu shot, f/u labs

## 2015-03-11 NOTE — Assessment & Plan Note (Signed)
stable overall by history and exam, recent data reviewed with pt, and pt to continue medical treatment as before,  to f/u any worsening symptoms or concerns .lsata

## 2015-03-11 NOTE — Patient Instructions (Signed)
You had the flu shot today  Please make a Nurse Visit appt for 2 wks for the Prevnar 13  Please continue all other medications as before, and refills have been done if requested.  Please have the pharmacy call with any other refills you may need.  Please continue your efforts at being more active, low cholesterol diet, and weight control.  You are otherwise up to date with prevention measures today.  Please keep your appointments with your specialists as you may have planned  Please go to the LAB in the Basement (turn left off the elevator) for the tests to be done tomorrow  You will be contacted by phone if any changes need to be made immediately.  Otherwise, you will receive a letter about your results with an explanation, but please check with MyChart first.  Please remember to sign up for MyChart if you have not done so, as this will be important to you in the future with finding out test results, communicating by private email, and scheduling acute appointments online when needed.  Please return in 6 months, or sooner if needed, with Lab testing done 3-5 days before
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
## 2015-03-12 ENCOUNTER — Other Ambulatory Visit (INDEPENDENT_AMBULATORY_CARE_PROVIDER_SITE_OTHER): Payer: Medicare PPO

## 2015-03-12 DIAGNOSIS — E1165 Type 2 diabetes mellitus with hyperglycemia: Secondary | ICD-10-CM | POA: Diagnosis not present

## 2015-03-12 DIAGNOSIS — E1151 Type 2 diabetes mellitus with diabetic peripheral angiopathy without gangrene: Secondary | ICD-10-CM

## 2015-03-12 DIAGNOSIS — IMO0002 Reserved for concepts with insufficient information to code with codable children: Secondary | ICD-10-CM

## 2015-03-12 DIAGNOSIS — Z0000 Encounter for general adult medical examination without abnormal findings: Secondary | ICD-10-CM | POA: Diagnosis not present

## 2015-03-12 LAB — URINALYSIS, ROUTINE W REFLEX MICROSCOPIC
Bilirubin Urine: NEGATIVE
Ketones, ur: NEGATIVE
Leukocytes, UA: NEGATIVE
Nitrite: NEGATIVE
PH: 5.5 (ref 5.0–8.0)
RBC / HPF: NONE SEEN (ref 0–?)
Specific Gravity, Urine: 1.025 (ref 1.000–1.030)
TOTAL PROTEIN, URINE-UPE24: NEGATIVE
Urine Glucose: NEGATIVE
Urobilinogen, UA: 0.2 (ref 0.0–1.0)

## 2015-03-12 LAB — LIPID PANEL
CHOL/HDL RATIO: 2
Cholesterol: 120 mg/dL (ref 0–200)
HDL: 52.7 mg/dL (ref >39.00)
LDL Cholesterol: 51 mg/dL (ref 0–99)
NONHDL: 67.79
TRIGLYCERIDES: 83 mg/dL (ref 0.0–149.0)
VLDL: 16.6 mg/dL (ref 0.0–40.0)

## 2015-03-12 LAB — CBC WITH DIFFERENTIAL/PLATELET
BASOS PCT: 0.6 % (ref 0.0–3.0)
Basophils Absolute: 0 10*3/uL (ref 0.0–0.1)
EOS ABS: 0.1 10*3/uL (ref 0.0–0.7)
Eosinophils Relative: 2.4 % (ref 0.0–5.0)
HCT: 36.7 % — ABNORMAL LOW (ref 39.0–52.0)
Hemoglobin: 12.3 g/dL — ABNORMAL LOW (ref 13.0–17.0)
LYMPHS ABS: 1.1 10*3/uL (ref 0.7–4.0)
Lymphocytes Relative: 24.2 % (ref 12.0–46.0)
MCHC: 33.5 g/dL (ref 30.0–36.0)
MCV: 90 fl (ref 78.0–100.0)
MONO ABS: 0.5 10*3/uL (ref 0.1–1.0)
Monocytes Relative: 10.3 % (ref 3.0–12.0)
NEUTROS ABS: 2.9 10*3/uL (ref 1.4–7.7)
Neutrophils Relative %: 62.5 % (ref 43.0–77.0)
PLATELETS: 213 10*3/uL (ref 150.0–400.0)
RBC: 4.08 Mil/uL — ABNORMAL LOW (ref 4.22–5.81)
RDW: 13.7 % (ref 11.5–15.5)
WBC: 4.6 10*3/uL (ref 4.0–10.5)

## 2015-03-12 LAB — HEPATIC FUNCTION PANEL
ALT: 12 U/L (ref 0–53)
AST: 18 U/L (ref 0–37)
Albumin: 4 g/dL (ref 3.5–5.2)
Alkaline Phosphatase: 72 U/L (ref 39–117)
BILIRUBIN DIRECT: 0.1 mg/dL (ref 0.0–0.3)
BILIRUBIN TOTAL: 0.5 mg/dL (ref 0.2–1.2)
Total Protein: 6.4 g/dL (ref 6.0–8.3)

## 2015-03-12 LAB — BASIC METABOLIC PANEL
BUN: 21 mg/dL (ref 6–23)
CHLORIDE: 104 meq/L (ref 96–112)
CO2: 27 mEq/L (ref 19–32)
Calcium: 9.1 mg/dL (ref 8.4–10.5)
Creatinine, Ser: 0.92 mg/dL (ref 0.40–1.50)
GFR: 88.38 mL/min (ref >60.00)
Glucose, Bld: 98 mg/dL (ref 70–99)
POTASSIUM: 4.3 meq/L (ref 3.5–5.1)
Sodium: 139 mEq/L (ref 135–145)

## 2015-03-12 LAB — TSH: TSH: 1.8 u[IU]/mL (ref 0.35–4.50)

## 2015-03-12 LAB — MICROALBUMIN / CREATININE URINE RATIO
Creatinine,U: 137.6 mg/dL
Microalb Creat Ratio: 0.5 mg/g (ref 0.0–30.0)

## 2015-03-12 LAB — HEMOGLOBIN A1C: HEMOGLOBIN A1C: 5.4 % (ref 4.6–6.5)

## 2015-03-12 LAB — PSA: PSA: 0.55 ng/mL (ref 0.10–4.00)

## 2015-03-17 ENCOUNTER — Other Ambulatory Visit: Payer: Self-pay | Admitting: Internal Medicine

## 2015-03-17 NOTE — Telephone Encounter (Signed)
Done hardcopy to Dahlia  

## 2015-03-18 NOTE — Telephone Encounter (Signed)
Rx faxed to pharmacy  

## 2015-03-19 ENCOUNTER — Ambulatory Visit: Payer: Medicare PPO | Admitting: Psychology

## 2015-04-01 ENCOUNTER — Ambulatory Visit: Payer: Self-pay

## 2015-04-02 ENCOUNTER — Ambulatory Visit (INDEPENDENT_AMBULATORY_CARE_PROVIDER_SITE_OTHER): Payer: Medicare PPO

## 2015-04-02 DIAGNOSIS — Z23 Encounter for immunization: Secondary | ICD-10-CM | POA: Diagnosis not present

## 2015-04-05 ENCOUNTER — Ambulatory Visit (INDEPENDENT_AMBULATORY_CARE_PROVIDER_SITE_OTHER): Payer: Medicare PPO

## 2015-04-05 DIAGNOSIS — Z111 Encounter for screening for respiratory tuberculosis: Secondary | ICD-10-CM

## 2015-04-07 DIAGNOSIS — Z111 Encounter for screening for respiratory tuberculosis: Secondary | ICD-10-CM

## 2015-04-07 LAB — TB SKIN TEST
Induration: 5 mm
TB SKIN TEST: POSITIVE

## 2015-04-08 ENCOUNTER — Encounter: Payer: Self-pay | Admitting: Internal Medicine

## 2015-04-08 ENCOUNTER — Ambulatory Visit (INDEPENDENT_AMBULATORY_CARE_PROVIDER_SITE_OTHER)
Admission: RE | Admit: 2015-04-08 | Discharge: 2015-04-08 | Disposition: A | Discharge status: Home / Self Care | Payer: Medicare PPO | Source: Ambulatory Visit | Attending: Internal Medicine | Admitting: Internal Medicine

## 2015-04-08 ENCOUNTER — Telehealth: Payer: Self-pay

## 2015-04-08 ENCOUNTER — Other Ambulatory Visit (INDEPENDENT_AMBULATORY_CARE_PROVIDER_SITE_OTHER): Payer: Medicare PPO

## 2015-04-08 ENCOUNTER — Other Ambulatory Visit: Payer: Self-pay | Admitting: Internal Medicine

## 2015-04-08 ENCOUNTER — Ambulatory Visit (INDEPENDENT_AMBULATORY_CARE_PROVIDER_SITE_OTHER): Payer: Medicare PPO | Admitting: Internal Medicine

## 2015-04-08 VITALS — BP 120/70 | HR 67 | Temp 98.1°F | Ht 72.0 in | Wt 269.0 lb

## 2015-04-08 DIAGNOSIS — R7611 Nonspecific reaction to tuberculin skin test without active tuberculosis: Secondary | ICD-10-CM

## 2015-04-08 DIAGNOSIS — R05 Cough: Secondary | ICD-10-CM

## 2015-04-08 DIAGNOSIS — R1030 Lower abdominal pain, unspecified: Secondary | ICD-10-CM

## 2015-04-08 DIAGNOSIS — R059 Cough, unspecified: Secondary | ICD-10-CM

## 2015-04-08 DIAGNOSIS — R109 Unspecified abdominal pain: Secondary | ICD-10-CM | POA: Insufficient documentation

## 2015-04-08 HISTORY — DX: Nonspecific reaction to tuberculin skin test without active tuberculosis: R76.11

## 2015-04-08 LAB — HEPATIC FUNCTION PANEL
ALT: 16 U/L (ref 0–53)
AST: 15 U/L (ref 0–37)
Albumin: 4 g/dL (ref 3.5–5.2)
Alkaline Phosphatase: 72 U/L (ref 39–117)
BILIRUBIN DIRECT: 0.1 mg/dL (ref 0.0–0.3)
BILIRUBIN TOTAL: 0.3 mg/dL (ref 0.2–1.2)
Total Protein: 7.4 g/dL (ref 6.0–8.3)

## 2015-04-08 LAB — BASIC METABOLIC PANEL
BUN: 18 mg/dL (ref 6–23)
CALCIUM: 9.1 mg/dL (ref 8.4–10.5)
CO2: 29 meq/L (ref 19–32)
CREATININE: 0.94 mg/dL (ref 0.40–1.50)
Chloride: 103 mEq/L (ref 96–112)
GFR: 86.19 mL/min (ref >60.00)
Glucose, Bld: 93 mg/dL (ref 70–99)
Potassium: 4.5 mEq/L (ref 3.5–5.1)
SODIUM: 139 meq/L (ref 135–145)

## 2015-04-08 LAB — CBC WITH DIFFERENTIAL/PLATELET
BASOS PCT: 0.7 % (ref 0.0–3.0)
Basophils Absolute: 0 10*3/uL (ref 0.0–0.1)
EOS PCT: 2.9 % (ref 0.0–5.0)
Eosinophils Absolute: 0.2 10*3/uL (ref 0.0–0.7)
HEMATOCRIT: 37.3 % — AB (ref 39.0–52.0)
HEMOGLOBIN: 12.5 g/dL — AB (ref 13.0–17.0)
Lymphocytes Relative: 26 % (ref 12.0–46.0)
Lymphs Abs: 1.8 10*3/uL (ref 0.7–4.0)
MCHC: 33.5 g/dL (ref 30.0–36.0)
MCV: 89 fl (ref 78.0–100.0)
MONO ABS: 0.7 10*3/uL (ref 0.1–1.0)
Monocytes Relative: 10.4 % (ref 3.0–12.0)
NEUTROS PCT: 60 % (ref 43.0–77.0)
Neutro Abs: 4.1 10*3/uL (ref 1.4–7.7)
PLATELETS: 381 10*3/uL (ref 150.0–400.0)
RBC: 4.19 Mil/uL — ABNORMAL LOW (ref 4.22–5.81)
RDW: 14.4 % (ref 11.5–15.5)
WBC: 6.9 10*3/uL (ref 4.0–10.5)

## 2015-04-08 LAB — URINALYSIS, ROUTINE W REFLEX MICROSCOPIC
Bilirubin Urine: NEGATIVE
Hgb urine dipstick: NEGATIVE
Ketones, ur: NEGATIVE
LEUKOCYTES UA: NEGATIVE
Nitrite: NEGATIVE
PH: 6 (ref 5.0–8.0)
RBC / HPF: NONE SEEN (ref 0–?)
SPECIFIC GRAVITY, URINE: 1.025 (ref 1.000–1.030)
Total Protein, Urine: NEGATIVE
Urine Glucose: NEGATIVE
Urobilinogen, UA: 0.2 (ref 0.0–1.0)

## 2015-04-08 LAB — LIPASE: Lipase: 9 U/L — ABNORMAL LOW (ref 11.0–59.0)

## 2015-04-08 MED ORDER — METRONIDAZOLE 250 MG PO TABS: 250.0000 mg | ORAL_TABLET | Freq: Three times a day (TID) | ORAL | Status: DC

## 2015-04-08 MED ORDER — LEVOFLOXACIN 500 MG PO TABS: 500.0000 mg | ORAL_TABLET | Freq: Every day | ORAL | Status: DC

## 2015-04-08 NOTE — Patient Instructions (Signed)
Please take all new medication as prescribed  - the two antibiotics  Please continue all other medications as before, and refills have been done if requested.  Please have the pharmacy call with any other refills you may need.  Please continue your efforts at being more active, low cholesterol diet, and weight control.  Please keep your appointments with your specialists as you may have planned  Please go to the XRAY Department in the Basement (go straight as you get off the elevator) for the x-ray testing  Please go to the LAB in the Basement (turn left off the elevator) for the tests to be done today  We may need to consider CT scan for the abdominal pending the tests  You will be contacted by phone if any changes need to be made immediately.  Otherwise, you will receive a letter about your results with an explanation, but please check with MyChart first.  Please remember to sign up for MyChart if you have not done so, as this will be important to you in the future with finding out test results, communicating by private email, and scheduling acute appointments online when needed.

## 2015-04-08 NOTE — Progress Notes (Signed)
Subjective:    Patient ID: Travis Fortis Sr., male    DOB: 08-05-1950, 64 y.o.   MRN: [id number]  HPI  Here with acute onset mild to mod 2-3 wks ST, HA, general weakness and malaise, with prod cough greenish sputum, but Pt denies chest pain, increased sob or doe, wheezing, orthopnea, PND, increased LE swelling, palpitations, dizziness or syncope.    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  Yearly TB test previous all neg, last approx 10 yrs, no known exposure since then. No BCG vaccination.  No other mycobacteriall infections known.  Also incidentally with lower abd pain , dull , mild to mod, tender to touch, worse to bend, .Denies urinary symptoms such as dysuria, frequency, urgency, flank pain, hematuria or n/v, fever, chills.  Denies worsening reflux, dysphagia, n/v, bowel change or blood.  Past Medical History  Diagnosis Date  . HYPERLIPIDEMIA-MIXED 07/15/2009  . Chronic pain syndrome 10/27/2009    of ankle, shoulders, low back.  sciatica.   [name] HYPERTENSION 06/24/2009  . CORONARY ARTERY DISEASE 06/24/2009    a. s/p multiple PCIs - In 2008 he had a Taxus DES to the mild LAD, Endeavor DES to mid LCX and distal LCX. In January 2009 he had DES to distal LCX, mid LCX and proximal LCX. In November 2009 had BMS x 2 to the mid RCA. Cath 10/2011 with patent stents, noncardiac CP. LHC 01/2013: patent stents (noncardiac CP).  [name] URETHRAL STRICTURE 06/24/2009    self catheterizes.   [name] NEPHROLITHIASIS, HX OF 06/24/2009  . ERECTILE DYSFUNCTION, ORGANIC 05/30/2010  . Obesity   . ALLERGIC RHINITIS 06/24/2009  . Depression with anxiety     Prior suicide attempt  . Irregular heart beat   . Pneumonia   . Non-cardiac chest pain 10/2011, 01/2013  . Type II diabetes mellitus ([place]) 2012    no meds in 09/2014.   [name] [place] DISEASE, LUMBAR 04/19/2010  . Arthritis     "all my joints" (09/30/2013)  . Fibromyalgia   . Myocardial infarction ([place]) 2008  . OSA (obstructive sleep apnea)     not using CPAP (09/30/2013)  .  Heart murmur    Past Surgical History  Procedure Laterality Date  . Shoulder open rotator cuff repair Right X 2  . Total knee arthroplasty Bilateral 2008  . Carpal tunnel release Bilateral   . Urethral dilation  X 4  . Knee arthroscopy Right X 7  . Umbilical hernia repair      UHR  . Hernia repair      umbilical  . Shoulder arthroscopy w/ rotator cuff repair Bilateral     "3on the left; 2 on the right"  . Tonsillectomy    . Knee cartilage surgery Right X 4    "open; not scopes"  . Knee cartilage surgery Left X 3    "open; not scopes"  . Coronary angioplasty with stent placement      "I have 9 stents"  . Cardiac catheterization  X 1  . Anterior cervical decomp/discectomy fusion N/A 09/26/2013    Procedure: ANTERIOR CERVICAL DISCECTOMY FUSION C3-4, plate and screw fixation, allograft bone graft;  Surgeon: Jessy Oto, MD;  Location: [place];  Service: Orthopedics;  Laterality: N/A;  . Laminectomy  01/28/2014    L3 L4 L5  . Lumbar laminectomy/decompression microdiscectomy N/A 01/27/2014    Procedure: CENTRAL LUMBAR LAMINECTOMY L4-5 AND L3-4;  Surgeon: Jessy Oto, MD;  Location: [place];  Service: Orthopedics;  Laterality: N/A;  .  Joint replacement Bilateral     knees  . Colonoscopy    . Vasectomy    . Fusion of talonavicular joint Right 04/15/2014    dr duda  . Ankle fusion Right 04/15/2014    Procedure: Right Subtalar, Talonavicular Fusion;  Surgeon: Newt Minion, MD;  Location: [place];  Service: Orthopedics;  Laterality: Right;  . Left heart catheterization with coronary angiogram N/A 02/10/2013    Procedure: LEFT HEART CATHETERIZATION WITH CORONARY ANGIOGRAM;  Surgeon: Burnell Blanks, MD;  Location: [facility] CATH LAB;  Service: Cardiovascular;  Laterality: N/A;  . Esophagogastroduodenoscopy N/A 10/19/2014    Procedure: ESOPHAGOGASTRODUODENOSCOPY (EGD);  Surgeon: Jerene Bears, MD;  Location: [facility] ENDOSCOPY;  Service: Endoscopy;  Laterality: N/A;  . Colonoscopy N/A 10/22/2014     Procedure: COLONOSCOPY;  Surgeon: Lafayette Dragon, MD;  Location: [facility] ENDOSCOPY;  Service: Endoscopy;  Laterality: N/A;    reports that he quit smoking about 4 years ago. His smoking use included Cigars. He has never used smokeless tobacco. He reports that he does not drink alcohol or use illicit drugs. family history includes Cancer in his father and mother; Coronary artery disease in his other; Depression in his brother, mother, and other; Diabetes in his father; Early death in his paternal grandfather; Early death (age of onset: 53) in his maternal grandfather; Heart disease in his father, maternal grandfather, and mother; Hyperlipidemia in his father; Hypertension in his brother, father, mother, and other. No Known Allergies  Current Outpatient Prescriptions on File Prior to Visit  Medication Sig Dispense Refill  . ARIPiprazole (ABILIFY) 5 MG tablet Take 1 tablet (5 mg total) by mouth at bedtime. 90 tablet 0  . aspirin 81 MG tablet Take 1 tablet (81 mg total) by mouth daily. Resume in 1 week after FU with PCP  0  . ASSURE COMFORT LANCETS 30G MISC     . atorvastatin (LIPITOR) 20 MG tablet TAKE 1 TABLET BY MOUTH EVERY DAY 90 tablet 0  . Blood Glucose Calibration (EMBRACE CONTROL) LOW SOLN     . Blood Glucose Monitoring Suppl (EMBRACE BLOOD GLUCOSE MONITOR) DEVI     . clonazePAM (KLONOPIN) 1 MG tablet TAKE 1 TABLET BY MOUTH AT BEDTIME AS NEEDED FOR ANXIETY 30 tablet 5  . cyclobenzaprine (FLEXERIL) 5 MG tablet TK 1 T PO  TID  4  . EMBRACE BLOOD GLUCOSE TEST test strip     . ferrous gluconate (FERGON) 324 MG tablet Take 1 tablet (324 mg total) by mouth 3 (three) times daily with meals. 60 tablet 3  . FLUoxetine (PROZAC) 40 MG capsule Take 1 capsule (40 mg total) by mouth daily. 90 capsule 0  . fluticasone (FLONASE) 50 MCG/ACT nasal spray Place 1 spray into both nostrils daily as needed (congestion).     . gabapentin (NEURONTIN) 400 MG capsule Take 2 capsules (800 mg total) by mouth 4 (four) times  daily. 240 capsule 3  . Lancet Devices (ADJUSTABLE LANCING DEVICE) MISC     . metFORMIN (GLUCOPHAGE) 500 MG tablet Take 1 tablet (500 mg total) by mouth 2 (two) times daily with a meal. 180 tablet 3  . Omega-3 Fatty Acids (FISH OIL) 1000 MG CAPS Take 1,000 mg by mouth daily.     [name] oxycodone (ROXICODONE) 30 MG immediate release tablet Take 30 mg by mouth 5 (five) times daily as needed for pain.   0  . oxymorphone (OPANA) 10 MG tablet TK 1 T PO D PRF PAIN FLARE  0  . pantoprazole (  PROTONIX) 40 MG tablet Take 1 tablet (40 mg total) by mouth daily. 90 tablet 1  . prasugrel (EFFIENT) 5 MG TABS tablet Take 1 tablet (5 mg total) by mouth daily. 90 tablet 1  . sildenafil (VIAGRA) 50 MG tablet Take 1 tablet (50 mg total) by mouth daily as needed for erectile dysfunction. 10 tablet 0  . tamsulosin (FLOMAX) 0.4 MG CAPS capsule Take 0.4 mg by mouth daily after breakfast.    . traZODone (DESYREL) 100 MG tablet Take 1 tablet (100 mg total) by mouth at bedtime. 90 tablet 0   No current facility-administered medications on file prior to visit.    Review of Systems  Constitutional: Negative for unusual diaphoresis or night sweats HENT: Negative for ringing in ear or discharge Eyes: Negative for double vision or worsening visual disturbance.  Respiratory: Negative for choking and stridor.   Gastrointestinal: Negative for vomiting or other signifcant bowel change Genitourinary: Negative for hematuria or change in urine volume.  Musculoskeletal: Negative for other MSK pain or swelling Skin: Negative for color change and worsening wound.  Neurological: Negative for tremors and numbness other than noted  Psychiatric/Behavioral: Negative for decreased concentration or agitation other than above       Objective:   Physical Exam BP 120/70 mmHg  Pulse 67  Temp(Src) 98.1 F (36.7 C) (Oral)  Ht 6' (1.829 m)  Wt 269 lb (122.018 kg)  BMI 36.48 kg/m2  SpO2 97% VS noted, mild ill Constitutional: Pt appears in  no significant distress HENT: Head: NCAT.  Right Ear: External ear normal.  Left Ear: External ear normal.  Bilat tm's with mild erythema.  Max sinus areas mild tender.  Pharynx with mild erythema, no exudate Eyes: . Pupils are equal, round, and reactive to light. Conjunctivae and EOM are normal Neck: Normal range of motion. Neck supple.  Cardiovascular: Normal rate and regular rhythm.   Pulmonary/Chest: Effort normal and breath sounds without rales or wheezing.  Abd:  Soft,  ND, + BS with mild lower bilat abd tender left more than right, no guarding or rebound Neurological: Pt is alert. Not confused , motor grossly intact Skin: Skin is warm. No rash, no LE edema Psychiatric: Pt behavior is normal. No agitation.  '      Ref Range 3d ago  83yrago     TB Skin Test  Positive Negative    Induration mm 5             Assessment & Plan:

## 2015-04-08 NOTE — Progress Notes (Signed)
Pre visit review using our clinic review tool, if applicable. No additional management support is needed unless otherwise documented below in the visit note. 

## 2015-04-08 NOTE — Telephone Encounter (Signed)
Patient was advised by Corrine/cma and tamara/team lead that he needed to have chest xray done after positve tb test on 11/16, patient was concerned about whether or not [facility] would pay for this imaging and he needed to contact them to ask how this expense would be covered before he would agree to chest xray---patient did not comply to staying here in the office until chest xray was completed, patient left office----phone note was routed to dr Jenny Reichmann

## 2015-04-09 ENCOUNTER — Encounter: Payer: Self-pay | Admitting: Internal Medicine

## 2015-04-10 NOTE — Assessment & Plan Note (Signed)
New finding, for cxr, also gold quant confirmation,  to f/u any worsening symptoms or concerns, refer ID

## 2015-04-10 NOTE — Assessment & Plan Note (Signed)
Mild to mod, c/w bronchitis vs pna, for cxr, for antibx course,  to f/u any worsening symptoms or concerns

## 2015-04-10 NOTE — Assessment & Plan Note (Signed)
?   msk due to cough, vs other, for labs as documented, consider CT,  to f/u any worsening symptoms or concerns

## 2015-04-11 LAB — URINE CULTURE

## 2015-04-14 ENCOUNTER — Other Ambulatory Visit: Payer: Self-pay | Admitting: Internal Medicine

## 2015-04-20 ENCOUNTER — Other Ambulatory Visit: Payer: Self-pay | Admitting: Adult Health

## 2015-04-20 ENCOUNTER — Ambulatory Visit (INDEPENDENT_AMBULATORY_CARE_PROVIDER_SITE_OTHER): Payer: Medicare PPO | Admitting: Internal Medicine

## 2015-04-20 ENCOUNTER — Encounter: Payer: Self-pay | Admitting: Internal Medicine

## 2015-04-20 VITALS — BP 146/74 | HR 68 | Temp 98.4°F | Ht 72.0 in | Wt 277.0 lb

## 2015-04-20 DIAGNOSIS — K4090 Unilateral inguinal hernia, without obstruction or gangrene, not specified as recurrent: Secondary | ICD-10-CM | POA: Diagnosis not present

## 2015-04-20 DIAGNOSIS — R7611 Nonspecific reaction to tuberculin skin test without active tuberculosis: Secondary | ICD-10-CM

## 2015-04-20 DIAGNOSIS — I10 Essential (primary) hypertension: Secondary | ICD-10-CM

## 2015-04-20 DIAGNOSIS — G894 Chronic pain syndrome: Secondary | ICD-10-CM

## 2015-04-20 NOTE — Patient Instructions (Signed)
You will be contacted regarding the referral for: General surgury, and Infectious disease  Please continue all other medications as before, and refills have been done if requested.  Please have the pharmacy call with any other refills you may need.  Please keep your appointments with your specialists as you may have planned

## 2015-04-20 NOTE — Progress Notes (Signed)
Subjective:    Patient ID: Travis Fortis Sr., male    DOB: Nov 02, 1950, 64 y.o.   MRN: [id number]  HPI  Here with worsening right inguinal area swelling and pain despite current pain med regimen, now moderate x 4 days, worse at end urination with straining, but Denies worsening reflux, abd pain, dysphagia, n/v, bowel change or blood, and Denies urinary symptoms such as dysuria, frequency, urgency, flank pain, hematuria or n/v, fever, chills. Pt denies chest pain, increased sob or doe, wheezing, orthopnea, PND, increased LE swelling, palpitations, dizziness or syncope. Past Medical History  Diagnosis Date  . HYPERLIPIDEMIA-MIXED 07/15/2009  . Chronic pain syndrome 10/27/2009    of ankle, shoulders, low back.  sciatica.   [name] HYPERTENSION 06/24/2009  . CORONARY ARTERY DISEASE 06/24/2009    a. s/p multiple PCIs - In 2008 he had a Taxus DES to the mild LAD, Endeavor DES to mid LCX and distal LCX. In January 2009 he had DES to distal LCX, mid LCX and proximal LCX. In November 2009 had BMS x 2 to the mid RCA. Cath 10/2011 with patent stents, noncardiac CP. LHC 01/2013: patent stents (noncardiac CP).  [name] URETHRAL STRICTURE 06/24/2009    self catheterizes.   [name] NEPHROLITHIASIS, HX OF 06/24/2009  . ERECTILE DYSFUNCTION, ORGANIC 05/30/2010  . Obesity   . ALLERGIC RHINITIS 06/24/2009  . Depression with anxiety     Prior suicide attempt  . Irregular heart beat   . Pneumonia   . Non-cardiac chest pain 10/2011, 01/2013  . Type II diabetes mellitus ([place]) 2012    no meds in 09/2014.   [name] [place] DISEASE, LUMBAR 04/19/2010  . Arthritis     "all my joints" (09/30/2013)  . Fibromyalgia   . Myocardial infarction ([place]) 2008  . OSA (obstructive sleep apnea)     not using CPAP (09/30/2013)  . Heart murmur    Past Surgical History  Procedure Laterality Date  . Shoulder open rotator cuff repair Right X 2  . Total knee arthroplasty Bilateral 2008  . Carpal tunnel release Bilateral   . Urethral dilation  X 4  . Knee arthroscopy  Right X 7  . Umbilical hernia repair      UHR  . Hernia repair      umbilical  . Shoulder arthroscopy w/ rotator cuff repair Bilateral     "3on the left; 2 on the right"  . Tonsillectomy    . Knee cartilage surgery Right X 4    "open; not scopes"  . Knee cartilage surgery Left X 3    "open; not scopes"  . Coronary angioplasty with stent placement      "I have 9 stents"  . Cardiac catheterization  X 1  . Anterior cervical decomp/discectomy fusion N/A 09/26/2013    Procedure: ANTERIOR CERVICAL DISCECTOMY FUSION C3-4, plate and screw fixation, allograft bone graft;  Surgeon: Jessy Oto, MD;  Location: [place];  Service: Orthopedics;  Laterality: N/A;  . Laminectomy  01/28/2014    L3 L4 L5  . Lumbar laminectomy/decompression microdiscectomy N/A 01/27/2014    Procedure: CENTRAL LUMBAR LAMINECTOMY L4-5 AND L3-4;  Surgeon: Jessy Oto, MD;  Location: [place];  Service: Orthopedics;  Laterality: N/A;  . Joint replacement Bilateral     knees  . Colonoscopy    . Vasectomy    . Fusion of talonavicular joint Right 04/15/2014    dr duda  . Ankle fusion Right 04/15/2014    Procedure: Right Subtalar, Talonavicular Fusion;  Surgeon: Beverely Low  Fernanda Drum, MD;  Location: [address];  Service: Orthopedics;  Laterality: Right;  . Left heart catheterization with coronary angiogram N/A 02/10/2013    Procedure: LEFT HEART CATHETERIZATION WITH CORONARY ANGIOGRAM;  Surgeon: Burnell Blanks, MD;  Location: [facility] CATH LAB;  Service: Cardiovascular;  Laterality: N/A;  . Esophagogastroduodenoscopy N/A 10/19/2014    Procedure: ESOPHAGOGASTRODUODENOSCOPY (EGD);  Surgeon: Jerene Bears, MD;  Location: [facility] ENDOSCOPY;  Service: Endoscopy;  Laterality: N/A;  . Colonoscopy N/A 10/22/2014    Procedure: COLONOSCOPY;  Surgeon: Lafayette Dragon, MD;  Location[facility] ENDOSCOPY;  Service: Endoscopy;  Laterality: N/A;    reports that he quit smoking about 4 years ago. His smoking use included Cigars. He has never used smokeless tobacco. He  reports that he does not drink alcohol or use illicit drugs. family history includes Cancer in his father and mother; Coronary artery disease in his other; Depression in his brother, mother, and other; Diabetes in his father; Early death in his paternal grandfather; Early death (age of onset: 31) in his maternal grandfather; Heart disease in his father, maternal grandfather, and mother; Hyperlipidemia in his father; Hypertension in his brother, father, mother, and other. No Known Allergies Current Outpatient Prescriptions on File Prior to Visit  Medication Sig Dispense Refill  . ARIPiprazole (ABILIFY) 5 MG tablet Take 1 tablet (5 mg total) by mouth at bedtime. 90 tablet 0  . aspirin 81 MG tablet Take 1 tablet (81 mg total) by mouth daily. Resume in 1 week after FU with PCP  0  . ASSURE COMFORT LANCETS 30G MISC     . atorvastatin (LIPITOR) 20 MG tablet TAKE 1 TABLET BY MOUTH EVERY DAY 90 tablet 0  . Blood Glucose Calibration (EMBRACE CONTROL) LOW SOLN     . Blood Glucose Monitoring Suppl (EMBRACE BLOOD GLUCOSE MONITOR) DEVI     . clonazePAM (KLONOPIN) 1 MG tablet TAKE 1 TABLET BY MOUTH AT BEDTIME AS NEEDED FOR ANXIETY 30 tablet 5  . cyclobenzaprine (FLEXERIL) 5 MG tablet TK 1 T PO  TID  4  . EMBRACE BLOOD GLUCOSE TEST test strip     . ferrous gluconate (FERGON) 324 MG tablet Take 1 tablet (324 mg total) by mouth 3 (three) times daily with meals. 60 tablet 3  . FLUoxetine (PROZAC) 40 MG capsule Take 1 capsule (40 mg total) by mouth daily. 90 capsule 0  . fluticasone (FLONASE) 50 MCG/ACT nasal spray Place 1 spray into both nostrils daily as needed (congestion).     . gabapentin (NEURONTIN) 400 MG capsule Take 2 capsules (800 mg total) by mouth 4 (four) times daily. 240 capsule 3  . Lancet Devices (ADJUSTABLE LANCING DEVICE) MISC     . levofloxacin (LEVAQUIN) 500 MG tablet Take 1 tablet (500 mg total) by mouth daily. 10 tablet 0  . metFORMIN (GLUCOPHAGE) 500 MG tablet Take 1 tablet (500 mg total) by  mouth 2 (two) times daily with a meal. 180 tablet 3  . metroNIDAZOLE (FLAGYL) 250 MG tablet Take 1 tablet (250 mg total) by mouth 3 (three) times daily. 30 tablet 0  . Omega-3 Fatty Acids (FISH OIL) 1000 MG CAPS Take 1,000 mg by mouth daily.     [name] oxycodone (ROXICODONE) 30 MG immediate release tablet Take 30 mg by mouth 5 (five) times daily as needed for pain.   0  . oxymorphone (OPANA) 10 MG tablet TK 1 T PO D PRF PAIN FLARE  0  . pantoprazole (PROTONIX) 40 MG tablet Take 1 tablet (40  mg total) by mouth daily. 90 tablet 1  . prasugrel (EFFIENT) 5 MG TABS tablet Take 1 tablet (5 mg total) by mouth daily. 90 tablet 1  . sildenafil (VIAGRA) 50 MG tablet Take 1 tablet (50 mg total) by mouth daily as needed for erectile dysfunction. 10 tablet 0  . tamsulosin (FLOMAX) 0.4 MG CAPS capsule Take 0.4 mg by mouth daily after breakfast.    . tamsulosin (FLOMAX) 0.4 MG CAPS capsule TAKE 1 CAPSULE DAILY AFTER BREAKFAST 90 capsule 1  . traZODone (DESYREL) 100 MG tablet Take 1 tablet (100 mg total) by mouth at bedtime. 90 tablet 0   No current facility-administered medications on file prior to visit.   Review of Systems  Constitutional: Negative for unusual diaphoresis or night sweats HENT: Negative for ringing in ear or discharge Eyes: Negative for double vision or worsening visual disturbance.  Respiratory: Negative for choking and stridor.   Gastrointestinal: Negative for vomiting or other signifcant bowel change Genitourinary: Negative for hematuria or change in urine volume.  Musculoskeletal: Negative for other MSK pain or swelling Skin: Negative for color change and worsening wound.  Neurological: Negative for tremors and numbness other than noted  Psychiatric/Behavioral: Negative for decreased concentration or agitation other than above       Objective:   Physical Exam  BP 146/74 mmHg  Pulse 68  Temp(Src) 98.4 F (36.9 C) (Oral)  Ht 6' (1.829 m)  Wt 277 lb (125.646 kg)  BMI 37.56 kg/m2   SpO2 97% VS noted,  Constitutional: Pt appears in no significant distress HENT: Head: NCAT.  Right Ear: External ear normal.  Left Ear: External ear normal.  Eyes: . Pupils are equal, round, and reactive to light. Conjunctivae and EOM are normal Neck: Normal range of motion. Neck supple.  Cardiovascular: Normal rate and regular rhythm.   Pulmonary/Chest: Effort normal and breath sounds without rales or wheezing.  Abd:  Soft, NT, ND, + BS GU: normal male ext genitalia without d/c, ulcer, mass or swelling except for tender vague mass right inguinal canal reducible Neurological: Pt is alert. Not confused , motor grossly intact Skin: Skin is warm. No rash, no LE edema Psychiatric: Pt behavior is normal. No agitation.     Assessment & Plan:

## 2015-04-21 NOTE — Assessment & Plan Note (Signed)
Mild elevated today likely situational, o/w stable overall by history and exam, recent data reviewed with pt, and pt to continue medical treatment as before,  to f/u any worsening symptoms or concerns BP Readings from Last 3 Encounters:  04/20/15 146/74  04/08/15 120/70  03/11/15 126/68

## 2015-04-21 NOTE — Assessment & Plan Note (Signed)
With increased symptoms in the past few days, o/w uncomplicated, for gen surgury referral (ordered as urgent due to backlog in referrals)

## 2015-04-21 NOTE — Assessment & Plan Note (Signed)
D/w pt, will need to cont current pain med regimen,  to f/u any worsening symptoms or concerns

## 2015-04-21 NOTE — Assessment & Plan Note (Signed)
Referral to ID changed to urgent due to backlog, o/w stable overall by history and exam, recent data reviewed with pt, - apparently the quant gold TB test was not able to be done, and pt to continue medical treatment as before, has been referred to ID,  to f/u any worsening symptoms or concerns

## 2015-04-23 ENCOUNTER — Other Ambulatory Visit: Payer: Self-pay | Admitting: Internal Medicine

## 2015-05-06 ENCOUNTER — Ambulatory Visit: Payer: Self-pay | Admitting: General Surgery

## 2015-05-10 ENCOUNTER — Encounter (HOSPITAL_COMMUNITY)
Admission: RE | Admit: 2015-05-10 | Discharge: 2015-05-10 | Disposition: A | Discharge status: Home / Self Care | Payer: Medicare PPO | Source: Ambulatory Visit | Attending: General Surgery | Admitting: General Surgery

## 2015-05-10 ENCOUNTER — Encounter (HOSPITAL_COMMUNITY): Payer: Self-pay

## 2015-05-10 DIAGNOSIS — K4090 Unilateral inguinal hernia, without obstruction or gangrene, not specified as recurrent: Secondary | ICD-10-CM | POA: Diagnosis not present

## 2015-05-10 DIAGNOSIS — Z7901 Long term (current) use of anticoagulants: Secondary | ICD-10-CM | POA: Diagnosis not present

## 2015-05-10 DIAGNOSIS — Z87442 Personal history of urinary calculi: Secondary | ICD-10-CM | POA: Diagnosis not present

## 2015-05-10 DIAGNOSIS — I252 Old myocardial infarction: Secondary | ICD-10-CM | POA: Diagnosis not present

## 2015-05-10 DIAGNOSIS — E1151 Type 2 diabetes mellitus with diabetic peripheral angiopathy without gangrene: Secondary | ICD-10-CM | POA: Diagnosis not present

## 2015-05-10 DIAGNOSIS — K219 Gastro-esophageal reflux disease without esophagitis: Secondary | ICD-10-CM | POA: Diagnosis not present

## 2015-05-10 DIAGNOSIS — I2510 Atherosclerotic heart disease of native coronary artery without angina pectoris: Secondary | ICD-10-CM | POA: Diagnosis not present

## 2015-05-10 DIAGNOSIS — I509 Heart failure, unspecified: Secondary | ICD-10-CM | POA: Diagnosis not present

## 2015-05-10 DIAGNOSIS — Z6837 Body mass index (BMI) 37.0-37.9, adult: Secondary | ICD-10-CM | POA: Diagnosis not present

## 2015-05-10 DIAGNOSIS — G894 Chronic pain syndrome: Secondary | ICD-10-CM | POA: Diagnosis not present

## 2015-05-10 DIAGNOSIS — G4730 Sleep apnea, unspecified: Secondary | ICD-10-CM | POA: Diagnosis not present

## 2015-05-10 DIAGNOSIS — F329 Major depressive disorder, single episode, unspecified: Secondary | ICD-10-CM | POA: Diagnosis not present

## 2015-05-10 DIAGNOSIS — Z87891 Personal history of nicotine dependence: Secondary | ICD-10-CM | POA: Diagnosis not present

## 2015-05-10 DIAGNOSIS — E785 Hyperlipidemia, unspecified: Secondary | ICD-10-CM | POA: Diagnosis not present

## 2015-05-10 DIAGNOSIS — E1165 Type 2 diabetes mellitus with hyperglycemia: Secondary | ICD-10-CM | POA: Diagnosis not present

## 2015-05-10 DIAGNOSIS — I110 Hypertensive heart disease with heart failure: Secondary | ICD-10-CM | POA: Diagnosis not present

## 2015-05-10 DIAGNOSIS — E669 Obesity, unspecified: Secondary | ICD-10-CM | POA: Diagnosis not present

## 2015-05-10 DIAGNOSIS — Z7984 Long term (current) use of oral hypoglycemic drugs: Secondary | ICD-10-CM | POA: Diagnosis not present

## 2015-05-10 DIAGNOSIS — F419 Anxiety disorder, unspecified: Secondary | ICD-10-CM | POA: Diagnosis not present

## 2015-05-10 LAB — BASIC METABOLIC PANEL
Anion gap: 10 (ref 5–15)
BUN: 21 mg/dL — AB (ref 6–20)
CHLORIDE: 105 mmol/L (ref 101–111)
CO2: 25 mmol/L (ref 22–32)
Calcium: 9 mg/dL (ref 8.9–10.3)
Creatinine, Ser: 0.89 mg/dL (ref 0.61–1.24)
GFR calc Af Amer: >60 mL/min (ref >60)
GFR calc non Af Amer: >60 mL/min (ref >60)
GLUCOSE: 109 mg/dL — AB (ref 65–99)
POTASSIUM: 4.3 mmol/L (ref 3.5–5.1)
Sodium: 140 mmol/L (ref 135–145)

## 2015-05-10 LAB — CBC WITH DIFFERENTIAL/PLATELET
Basophils Absolute: 0 10*3/uL (ref 0.0–0.1)
Basophils Relative: 1 %
Eosinophils Absolute: 0.2 10*3/uL (ref 0.0–0.7)
Eosinophils Relative: 5 %
HEMATOCRIT: 35.1 % — AB (ref 39.0–52.0)
HEMOGLOBIN: 11.7 g/dL — AB (ref 13.0–17.0)
LYMPHS ABS: 1.2 10*3/uL (ref 0.7–4.0)
LYMPHS PCT: 29 %
MCH: 29.6 pg (ref 26.0–34.0)
MCHC: 33.3 g/dL (ref 30.0–36.0)
MCV: 88.9 fL (ref 78.0–100.0)
MONOS PCT: 11 %
Monocytes Absolute: 0.4 10*3/uL (ref 0.1–1.0)
NEUTROS PCT: 54 %
Neutro Abs: 2.2 10*3/uL (ref 1.7–7.7)
Platelets: 200 10*3/uL (ref 150–400)
RBC: 3.95 MIL/uL — AB (ref 4.22–5.81)
RDW: 13.1 % (ref 11.5–15.5)
WBC: 4 10*3/uL (ref 4.0–10.5)

## 2015-05-10 LAB — GLUCOSE, CAPILLARY: Glucose-Capillary: 138 mg/dL — ABNORMAL HIGH (ref 65–99)

## 2015-05-10 MED ORDER — DEXTROSE 5 % IV SOLN
3.0000 g | INTRAVENOUS | Status: AC
  Administered 2015-05-11: 3 g via INTRAVENOUS
  Filled 2015-05-10: qty 3000

## 2015-05-10 MED ORDER — CHLORHEXIDINE GLUCONATE 4 % EX LIQD: 1.0000 "application " | Freq: Once | CUTANEOUS | Status: DC

## 2015-05-10 NOTE — Pre-Procedure Instructions (Addendum)
Travis Fortis Sr.  05/10/2015      [place], [place] [address] Phone: [phone] Fax: [place] [id number] - [place], [place] [address] Phone: [phone] Fax: [phone]    Your procedure is scheduled on 05/11/15.  Report to [facility] Admitting at 715 A.M.  Call this number if you have problems the morning of surgery:  [phone]   Remember:  Do not eat food or drink liquids after midnight.  Take these medicines the morning of surgery with A SIP OF WATER prozac, gapapentin,protonix, flomax, pain med if needed  STOP all herbel meds, nsaids (aleve,naproxen,advil,ibuprofen) TODAY including aspirin, fish oil   STOP effient per dr 05/07/15  How to Manage Your Diabetes Before Surgery   Why is it important to control my blood sugar before and after surgery?   Improving blood sugar levels before and after surgery helps healing and can limit problems.  A way of improving blood sugar control is eating a healthy diet by:  - Eating less sugar and carbohydrates  - Increasing activity/exercise  - Talk with your doctor about reaching your blood sugar goals  High blood sugars (greater than 180 mg/dL) can raise your risk of infections and slow down your recovery so you will need to focus on controlling your diabetes during the weeks before surgery.  Make sure that the doctor who takes care of your diabetes knows about your planned surgery including the date and location.  How do I manage my blood sugars before surgery?   Check your blood sugar at least 4 times a day, 2 days before surgery to make sure that they are not too high or low.   Check your blood sugar the morning of your surgery when you wake up and every 2               hours until you get to the Short-Stay unit.  If your blood  sugar is less than 70 mg/dL, you will need to treat for low blood sugar by:  Treat a low blood sugar (less than 70 mg/dL) with 1/2 cup of clear juice (cranberry or apple), 4 glucose tablets, OR glucose gel.  Recheck blood sugar in 15 minutes after treatment (to make sure it is greater than 70 mg/dL).  If blood sugar is not greater than 70 mg/dL on re-check, call [phone] for further instructions.   Report your blood sugar to the Short-Stay nurse when you get to Short-Stay.  References:  [facility], 2007 "How to Manage your Diabetes Before and After Surgery".  What do I do about my diabetes medications?   Do not take oral diabetes medicines (pills) the morning of surgery.(Metformin.     Do not wear jewelry, make-up or nail polish.  Do not wear lotions, powders, or perfumes.  You may wear deodorant.  Do not shave 48 hours prior to surgery.  Men may shave face and neck.  Do not bring valuables to the hospital.  [facility] is not responsible for any belongings or valuables.  Contacts, dentures or bridgework may not be worn into surgery.  Leave your suitcase in the car.  After surgery it may be brought to your room.  For patients admitted to the hospital, discharge time will be determined by your treatment  team.  Patients discharged the day of surgery will not be allowed to drive home.   Name and phone number of your driver:    Special instructions:   Special Instructions:  - Preparing for Surgery  Before surgery, you can play an important role.  Because skin is not sterile, your skin needs to be as free of germs as possible.  You can reduce the number of germs on you skin by washing with CHG (chlorahexidine gluconate) soap before surgery.  CHG is an antiseptic cleaner which kills germs and bonds with the skin to continue killing germs even after washing.  Please DO NOT use if you have an allergy to CHG or antibacterial soaps.  If your skin  becomes reddened/irritated stop using the CHG and inform your nurse when you arrive at Short Stay.  Do not shave (including legs and underarms) for at least 48 hours prior to the first CHG shower.  You may shave your face.  Please follow these instructions carefully:   1.  Shower with CHG Soap the night before surgery and the morning of Surgery.  2.  If you choose to wash your hair, wash your hair first as usual with your normal shampoo.  3.  After you shampoo, rinse your hair and body thoroughly to remove the Shampoo.  4.  Use CHG as you would any other liquid soap.  You can apply chg directly  to the skin and wash gently with scrungie or a clean washcloth.  5.  Apply the CHG Soap to your body ONLY FROM THE NECK DOWN.  Do not use on open wounds or open sores.  Avoid contact with your eyes ears, mouth and genitals (private parts).  Wash genitals (private parts)       with your normal soap.  6.  Wash thoroughly, paying special attention to the area where your surgery will be performed.  7.  Thoroughly rinse your body with warm water from the neck down.  8.  DO NOT shower/wash with your normal soap after using and rinsing off the CHG Soap.  9.  Pat yourself dry with a clean towel.            10.  Wear clean pajamas.            11.  Place clean sheets on your bed the night of your first shower and do not sleep with pets.  Day of Surgery  Do not apply any lotions/deodorants the morning of surgery.  Please wear clean clothes to the hospital/surgery center.  Please read over the following fact sheets that you were given. Pain Booklet, Coughing and Deep Breathing and Surgical Site Infection Prevention

## 2015-05-10 NOTE — Progress Notes (Signed)
Referred Myra Gianotti, PA to Dr Tommy Medal, Infectious Disease, for chart review since ID consult had been requested by Dr [name].  Launa Grill, RN Infection Prevention

## 2015-05-10 NOTE — Progress Notes (Signed)
Anesthesia Chart Review: Patient is a 64 year old male scheduled for laparoscopic right IHR tomorrow by Dr. Redmond Pulling. PAT was today.  History includes former smoker, HLD, chronic pain, HTN, CAD (s/p DES LAD, LCX '08, DES LCX 03/2008, BMS RCA 11/209), ED, anxiety and depression (with prior suicide attempt), DM2, fibromyalgia, irregular heart beat, anemia (right hip hematoma after fall from [facility] 09/2014 s/p 4 Units PRBCS, EGD/colonoscopy also showed gastritis), OSA (no CPAP), ureteral stricture (history of self cath), arthritis, right ankle fusion 04/15/14, L3-5 lumbar laminectomy, C3-4 ACDF 09/26/13, +PPD 03/2015 (CXR negative, but referred to ID). BMI is consistent with obesity. PCP is Dr. [name].   Cardiologist is Dr. Loralie Champagne, last visit 01/11/15. There is a handwritten note (see Media tab) regarding cardiac clearance that states, "If no new symptoms, may hold Effient prior to procedure and restart afterwards."   I called and spoke with Arbie Cookey with Infection Control about +PPD and negative CXR. She recommended that I speak with one of the ID MDs since a consult was pending. I spoke with Dr. Tommy Medal who said that since patient had a negative chest xray that no additional precautions that would be needed if patient was asymptomatic. (By Dr. 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 No F/C, CP, SOB, DOE, wheezing, orthopnea, PND documented in Dr. Gwynn Burly most recent notes.)  Meds include Abilitly, ASA, Lipitor, Klonopin, Flexeril, Prozac, Flonase, Neurontin, metformin, fish oil, oxycodone, Opana, Protonix, Effient (held 05/07/15), Viagra, Flomax, trazodone.   10/17/14 EKG: SR, borderline intraventricular conduction delay.  10/19/14 Echo: Study Conclusions - Left ventricle: The cavity size was normal. There was mild concentric hypertrophy. Systolic function was vigorous. The estimated ejection fraction was in the range of 65% to 70%. Wall motion was  normal; there were no regional wall motion abnormalities. Left ventricular diastolic function parameters were normal. - Aortic valve: Trileaflet; normal thickness leaflets. There was no regurgitation. - Aortic root: The aortic root was normal in size. - Ascending aorta: The ascending aorta was normal in size. - Mitral valve: Structurally normal valve. There was no regurgitation. - Left atrium: The atrium was normal in size. - Right ventricle: Systolic function was normal. - Right atrium: The atrium was normal in size. - Tricuspid valve: Structurally normal valve. There was mild-moderate regurgitation. - Pulmonic valve: There was trivial regurgitation. - Pulmonary arteries: Systolic pressure was within the normal range. - Inferior vena cava: The vessel was normal in size. - Pericardium, extracardiac: There was no pericardial effusion. Impressions: - Mild to moderate tricuspid regurgitation, otherwise normal study.  10/24/11 [facility] (Dr. Angelena Form):  Hemodynamic Findings: Central aortic pressure: 99/49 Left ventricular pressure: 99/12/20 Angiographic Findings: Left main: No obstructive disease.  Left Anterior Descending Artery: Large vessel that courses to the apex. There is a patent stent in the mid vessel. There are minor luminal irregularities in the mid and distal vessel.  Circumflex Artery: Moderate sized vessel with patents stents in the proximal and mid vessel and in the distal OM branch. No obstructive disease noted.  Right Coronary Artery: Moderate sized dominant vessel with patent proximal stents. No restenosis. Mild plaque in the mid vessel.  Left Ventricular Angiogram: LVEF 55%. Impression: 1. Triple vessel CAD with patent stents in the RCA, LAD and Circumflex 2. Normal LV systolic function 3. Non-cardiac chest pain Recommendations: Continue medical management. Will add Pepcid 20 mg po Qdaily for possible GERD. If symptoms persist, may need to consider PPI and  GI workup.   04/08/15 CXR (ordered due to +PPD): IMPRESSION: No active cardiopulmonary disease.  Preoperative labs noted. (On 04/05/15 had +PPD at Dr. Gwynn Burly office with negative CXR.)   He will be further evaluated by his surgeon and anesthesiologist prior to surgery. In no new cardiopulmonary or constitutional symptoms then I would anticipate that he could proceed as planned.   George Hugh [facility] Short Stay Center/Anesthesiology Phone [phone] 05/10/2015 5:36 PM

## 2015-05-10 NOTE — H&P (Signed)
Travis [name] 04/26/2015 3:17 PM Location: [facility] Patient #: [id number] DOB: 1950-11-01 Single / Language: Travis Molt / Race: White Male   History of Present Illness Travis [name] M. [name] MD; 04/26/2015 3:49 PM) The patient is a 64 year old male who presents with an inguinal hernia. He is referred here by Dr [name] for evaluation of right inguinal hernia. He states that he has felt a right bulge for at least 3-4 months. Initially it did not really bother him. Now it is bothering him daily. He describes it as a stabbing sensation. He states that it is hurting him all the time. He denies any fever, chills, nausea, vomiting, diarrhea or constipation. He has had a prior vasectomy. He also intermittently self catheterizes himself for urinary stricture however he can spontaneously void. He does take Flomax. He denies any chest pain, chest pressure, shortness of breath, orthopnea, paroxysmal nocturnal dyspnea. He does have a cardiologist. He is on oral blood thinner. He states he's had 9 cardiac stents. He takes chronic pain medication for chronic pain. He is under a pain contract with referred pain management.   Problem List/Past Medical Travis [name] Ronnie Derby, MD; 04/26/2015 3:49 PM) RIGHT INGUINAL HERNIA (K40.90)  Other Problems Gayland Curry, MD; 04/26/2015 3:49 PM) Anxiety Disorder Arthritis Back Pain Congestive Heart Failure Diabetes Mellitus Gastroesophageal Reflux Disease High blood pressure Inguinal Hernia Kidney Stone Myocardial infarction Umbilical Hernia Repair  Past Surgical History Elbert Ewings, CMA; 04/26/2015 3:17 PM) Knee Surgery Bilateral. Shoulder Surgery Bilateral. Spinal Surgery - Lower Back Spinal Surgery - Neck Tonsillectomy Vasectomy  Diagnostic Studies History Elbert Ewings, CMA; 04/26/2015 3:17 PM) Colonoscopy within last year  Allergies Elbert Ewings, CMA; 04/26/2015 3:17 PM) No Known Drug Allergies12/09/2014  Medication  History Elbert Ewings, CMA; 04/26/2015 3:20 PM) ARIPiprazole (5MG Tablet, Oral) Active. Atorvastatin Calcium (20MG Tablet, Oral) Active. ClonazePAM (1MG Tablet, Oral) Active. Cyclobenzaprine HCl (5MG Tablet, Oral) Active. Ferrous Gluconate (324 (38 Fe)MG Tablet, Oral) Active. Fish Oil (1000MG Capsule, Oral) Active. FLUoxetine HCl (40MG Capsule, Oral) Active. Gabapentin (400MG Capsule, Oral) Active. LevoFLOXacin (500MG Tablet, Oral) Active. MetFORMIN HCl (500MG Tablet, Oral) Active. MetroNIDAZOLE (250MG Tablet, Oral) Active. OxyCODONE HCl (30MG Tablet, Oral) Active. OxyMORphone HCl (10MG Tablet, Oral) Active. Pantoprazole Sodium (40MG Tablet DR, Oral) Active. Prasugrel HCl (5MG Tablet, Oral) Active. Sildenafil Citrate (50MG Tablet, Oral) Active. Tamsulosin HCl (0.4MG Capsule, Oral) Active. TraZODone HCl (100MG Tablet, Oral) Active. Medications Reconciled  Social History Elbert Ewings, [place]; 04/26/2015 3:17 PM) Alcohol use Occasional alcohol use. Caffeine use Carbonated beverages. No drug use Tobacco use Former smoker.  Family History Elbert Ewings, [place]; 04/26/2015 3:17 PM) Arthritis Mother. Breast Cancer Mother. Cancer Mother. Cervical Cancer Mother. Depression Brother, Mother. Diabetes Mellitus Father. Heart Disease Father, Mother. Heart disease in male family member before age 32 Hypertension Father. Malignant Neoplasm Of Pancreas Mother. Melanoma Father. Migraine Headache Mother. Ovarian Cancer Mother.    Review of Systems Elbert Ewings CMA; 04/26/2015 3:17 PM) General Present- Weight Gain. Not Present- Appetite Loss, Chills, Fatigue, Fever, Night Sweats and Weight Loss. Skin Present- Dryness. Not Present- Change in Wart/Mole, Hives, Jaundice, New Lesions, Non-Healing Wounds, Rash and Ulcer. HEENT Present- Hearing Loss and Wears glasses/contact lenses. Not Present- Earache, Hoarseness, Nose Bleed, Oral Ulcers, Ringing in the Ears, Seasonal  Allergies, Sinus Pain, Sore Throat, Visual Disturbances and Yellow Eyes. Gastrointestinal Present- Abdominal Pain, Change in Bowel Habits and Difficulty Swallowing. Not Present- Bloating, Bloody Stool, Chronic diarrhea, Constipation, Excessive gas, Gets full quickly at meals, Hemorrhoids, Indigestion, Nausea, Rectal Pain and  Vomiting. Male Genitourinary Present- Frequency, Impotence, Nocturia, Urgency and Urine Leakage. Not Present- Blood in Urine, Change in Urinary Stream and Painful Urination. Musculoskeletal Present- Back Pain, Joint Pain, Joint Stiffness and Muscle Weakness. Not Present- Muscle Pain and Swelling of Extremities. Neurological Present- Decreased Memory, Trouble walking and Weakness. Not Present- Fainting, Headaches, Numbness, Seizures, Tingling and Tremor. Psychiatric Present- Anxiety, Change in Sleep Pattern and Depression. Not Present- Bipolar, Fearful and Frequent crying. Endocrine Present- Excessive Hunger. Not Present- Cold Intolerance, Hair Changes, Heat Intolerance, Hot flashes and New Diabetes. Hematology Present- Easy Bruising and Excessive bleeding. Not Present- Gland problems, HIV and Persistent Infections.  Vitals Elbert Ewings CMA; 04/26/2015 3:18 PM) 04/26/2015 3:17 PM Weight: 275 lb Height: 73in Body Surface Area: 2.46 m Body Mass Index: 36.28 kg/m  Temp.: 98.30F(Temporal)  Pulse: 69 (Regular)  BP: 136/64 (Sitting, Left Arm, Standard)       Physical Exam Travis [name] M. Darlisa Spruiell MD; 04/26/2015 3:47 PM) General Mental Status-Alert. General Appearance-Consistent with stated age. Hydration-Well hydrated. Voice-Normal. Note: obese   Head and Neck Head-normocephalic, atraumatic with no lesions or palpable masses. Trachea-midline. Thyroid Gland Characteristics - normal size and consistency.  Eye Eyeball - Bilateral-Extraocular movements intact. Sclera/Conjunctiva - Bilateral-No scleral icterus.  Chest and Lung Exam Chest and lung  exam reveals -quiet, even and easy respiratory effort with no use of accessory muscles and on auscultation, normal breath sounds, no adventitious sounds and normal vocal resonance. Inspection Chest Wall - Normal. Back - normal.  Breast - Did not examine.  Cardiovascular Cardiovascular examination reveals -normal heart sounds, regular rate and rhythm with no murmurs and normal pedal pulses bilaterally.  Abdomen Inspection Inspection of the abdomen reveals - No Hernias. Skin - Scar - no surgical scars. Palpation/Percussion Palpation and Percussion of the abdomen reveal - Soft, Non Tender, No Rebound tenderness, No Rigidity (guarding) and No hepatosplenomegaly. Auscultation Auscultation of the abdomen reveals - Bowel sounds normal.  Male Genitourinary Note: high riding L testicle. +right inguinal hernia. palpable mass in L scrotum (hernia); no testicular masses   Peripheral Vascular Upper Extremity Palpation - Pulses bilaterally normal.  Neurologic Neurologic evaluation reveals -alert and oriented x 3 with no impairment of recent or remote memory. Mental Status-Normal.  Neuropsychiatric The patient's mood and affect are described as -normal. Judgment and Insight-insight is appropriate concerning matters relevant to self.  Musculoskeletal Normal Exam - Left-Upper Extremity Strength Normal and Lower Extremity Strength Normal. Normal Exam - Right-Upper Extremity Strength Normal and Lower Extremity Strength Normal.  Lymphatic Head & Neck  General Head & Neck Lymphatics: Bilateral - Description - Normal. Axillary - Did not examine. Femoral & Inguinal - Did not examine.    Assessment & Plan Travis [name] M. Chaylee Ehrsam MD; 04/26/2015 3:48 PM) RIGHT INGUINAL HERNIA (K40.90) Impression: We discussed the etiology of inguinal hernias. We discussed the signs & symptoms of incarceration & strangulation. We discussed non-operative and operative management.  The patient has  elected to proceed with LAPAROSCOPIC RIGHT INGUINAL HERNIA WITH MESH  I described the procedure in detail. The patient was given educational material. We discussed the risks and benefits including but not limited to bleeding, infection, chronic inguinal pain, nerve entrapment, hernia recurrence, mesh complications, hematoma formation, urinary retention, injury to the testicles or the ovaries, numbness in the groin, blood clots, injury to the surrounding structures, and anesthesia risk. We also discussed the typical post operative recovery course, including no heavy lifting for 4-6 weeks. I explained that the likelihood of improvement of their symptoms is good. however we  did discuss that he is at higher risk for chronic pain given preop 'nerve' pain.  we will need cards clearance and permission to hold his perioperative blood thinners. Current Plans I recommended obtaining preoperative cardiac clearance. I am concerned about the health of the patient and the ability to tolerate the operation. Therefore, we will request clearance by cardiology to better assess operative risk & see if a reevaluation, further workup, etc is needed. Also recommendations on how medications such as for anticoagulation and blood pressure should be managed/held/restarted after surgery. Pt Education - Pamphlet Given - Laparoscopic Hernia Repair: discussed with patient and provided information.   Leighton Ruff. Redmond Pulling, MD, FACS General, Bariatric, & Minimally Invasive Surgery [facility], [place]

## 2015-05-11 ENCOUNTER — Ambulatory Visit (HOSPITAL_COMMUNITY): Payer: Medicare PPO | Admitting: Anesthesiology

## 2015-05-11 ENCOUNTER — Observation Stay (HOSPITAL_COMMUNITY)
Admission: RE | Admit: 2015-05-11 | Discharge: 2015-05-12 | Disposition: A | Discharge status: Home / Self Care | Payer: Medicare PPO | Source: Ambulatory Visit | Attending: General Surgery | Admitting: General Surgery

## 2015-05-11 ENCOUNTER — Ambulatory Visit (HOSPITAL_COMMUNITY): Payer: Medicare PPO | Admitting: Vascular Surgery

## 2015-05-11 ENCOUNTER — Encounter (HOSPITAL_COMMUNITY)
Admission: RE | Disposition: A | Discharge status: Home / Self Care | Payer: Self-pay | Source: Ambulatory Visit | Attending: General Surgery

## 2015-05-11 ENCOUNTER — Encounter (HOSPITAL_COMMUNITY): Payer: Self-pay | Admitting: *Deleted

## 2015-05-11 DIAGNOSIS — Z7901 Long term (current) use of anticoagulants: Secondary | ICD-10-CM

## 2015-05-11 DIAGNOSIS — Z9889 Other specified postprocedural states: Secondary | ICD-10-CM

## 2015-05-11 DIAGNOSIS — E1151 Type 2 diabetes mellitus with diabetic peripheral angiopathy without gangrene: Secondary | ICD-10-CM | POA: Insufficient documentation

## 2015-05-11 DIAGNOSIS — G894 Chronic pain syndrome: Secondary | ICD-10-CM | POA: Diagnosis present

## 2015-05-11 DIAGNOSIS — E669 Obesity, unspecified: Secondary | ICD-10-CM | POA: Diagnosis present

## 2015-05-11 DIAGNOSIS — I252 Old myocardial infarction: Secondary | ICD-10-CM | POA: Insufficient documentation

## 2015-05-11 DIAGNOSIS — Z87442 Personal history of urinary calculi: Secondary | ICD-10-CM | POA: Insufficient documentation

## 2015-05-11 DIAGNOSIS — K4090 Unilateral inguinal hernia, without obstruction or gangrene, not specified as recurrent: Principal | ICD-10-CM

## 2015-05-11 DIAGNOSIS — Z87891 Personal history of nicotine dependence: Secondary | ICD-10-CM | POA: Insufficient documentation

## 2015-05-11 DIAGNOSIS — I2510 Atherosclerotic heart disease of native coronary artery without angina pectoris: Secondary | ICD-10-CM | POA: Diagnosis present

## 2015-05-11 DIAGNOSIS — Z7984 Long term (current) use of oral hypoglycemic drugs: Secondary | ICD-10-CM | POA: Insufficient documentation

## 2015-05-11 DIAGNOSIS — F419 Anxiety disorder, unspecified: Secondary | ICD-10-CM | POA: Insufficient documentation

## 2015-05-11 DIAGNOSIS — E785 Hyperlipidemia, unspecified: Secondary | ICD-10-CM | POA: Insufficient documentation

## 2015-05-11 DIAGNOSIS — E1165 Type 2 diabetes mellitus with hyperglycemia: Secondary | ICD-10-CM | POA: Insufficient documentation

## 2015-05-11 DIAGNOSIS — Z6837 Body mass index (BMI) 37.0-37.9, adult: Secondary | ICD-10-CM | POA: Insufficient documentation

## 2015-05-11 DIAGNOSIS — G4730 Sleep apnea, unspecified: Secondary | ICD-10-CM | POA: Insufficient documentation

## 2015-05-11 DIAGNOSIS — Z8719 Personal history of other diseases of the digestive system: Secondary | ICD-10-CM

## 2015-05-11 DIAGNOSIS — I110 Hypertensive heart disease with heart failure: Secondary | ICD-10-CM | POA: Insufficient documentation

## 2015-05-11 DIAGNOSIS — I10 Essential (primary) hypertension: Secondary | ICD-10-CM | POA: Diagnosis present

## 2015-05-11 DIAGNOSIS — K219 Gastro-esophageal reflux disease without esophagitis: Secondary | ICD-10-CM | POA: Insufficient documentation

## 2015-05-11 DIAGNOSIS — I509 Heart failure, unspecified: Secondary | ICD-10-CM | POA: Insufficient documentation

## 2015-05-11 DIAGNOSIS — F329 Major depressive disorder, single episode, unspecified: Secondary | ICD-10-CM | POA: Insufficient documentation

## 2015-05-11 HISTORY — PX: INGUINAL HERNIA REPAIR: SHX194

## 2015-05-11 HISTORY — PX: INSERTION OF MESH: SHX5868

## 2015-05-11 LAB — GLUCOSE, CAPILLARY
GLUCOSE-CAPILLARY: 120 mg/dL — AB (ref 65–99)
GLUCOSE-CAPILLARY: 116 mg/dL — AB (ref 65–99)
Glucose-Capillary: 107 mg/dL — ABNORMAL HIGH (ref 65–99)
Glucose-Capillary: 125 mg/dL — ABNORMAL HIGH (ref 65–99)

## 2015-05-11 SURGERY — REPAIR, HERNIA, INGUINAL, LAPAROSCOPIC
Anesthesia: General | Site: Groin | Laterality: Right

## 2015-05-11 MED ORDER — TRAZODONE HCL 100 MG PO TABS
100.0000 mg | ORAL_TABLET | Freq: Every day | ORAL | Status: DC
  Administered 2015-05-11: 100 mg via ORAL
  Filled 2015-05-11: qty 1

## 2015-05-11 MED ORDER — ARTIFICIAL TEARS OP OINT
TOPICAL_OINTMENT | OPHTHALMIC | Status: DC | PRN
  Administered 2015-05-11: 1 via OPHTHALMIC

## 2015-05-11 MED ORDER — GLYCOPYRROLATE 0.2 MG/ML IJ SOLN
INTRAMUSCULAR | Status: DC | PRN
  Administered 2015-05-11: .2 mg via INTRAVENOUS

## 2015-05-11 MED ORDER — EPHEDRINE SULFATE 50 MG/ML IJ SOLN
INTRAMUSCULAR | Status: AC
  Filled 2015-05-11: qty 1

## 2015-05-11 MED ORDER — SODIUM CHLORIDE 0.9 % IJ SOLN
INTRAMUSCULAR | Status: AC
  Filled 2015-05-11: qty 10

## 2015-05-11 MED ORDER — FLUOXETINE HCL 20 MG PO CAPS
40.0000 mg | ORAL_CAPSULE | Freq: Every day | ORAL | Status: DC
  Administered 2015-05-12: 40 mg via ORAL
  Filled 2015-05-11: qty 2

## 2015-05-11 MED ORDER — DIPHENHYDRAMINE HCL 50 MG/ML IJ SOLN: 12.5000 mg | Freq: Four times a day (QID) | INTRAMUSCULAR | Status: DC | PRN

## 2015-05-11 MED ORDER — POTASSIUM CHLORIDE IN NACL 20-0.45 MEQ/L-% IV SOLN
INTRAVENOUS | Status: DC
  Administered 2015-05-11: 18:00:00 via INTRAVENOUS
  Filled 2015-05-11 (×2): qty 1000

## 2015-05-11 MED ORDER — BUPIVACAINE-EPINEPHRINE 0.25% -1:200000 IJ SOLN
INTRAMUSCULAR | Status: DC | PRN
  Administered 2015-05-11 (×2): 30 mL

## 2015-05-11 MED ORDER — GABAPENTIN 400 MG PO CAPS
800.0000 mg | ORAL_CAPSULE | Freq: Four times a day (QID) | ORAL | Status: DC
  Administered 2015-05-11 – 2015-05-12 (×4): 800 mg via ORAL
  Filled 2015-05-11 (×4): qty 2

## 2015-05-11 MED ORDER — INSULIN ASPART 100 UNIT/ML ~~LOC~~ SOLN
0.0000 [IU] | Freq: Three times a day (TID) | SUBCUTANEOUS | Status: DC
  Administered 2015-05-12: 2 [IU] via SUBCUTANEOUS

## 2015-05-11 MED ORDER — ROCURONIUM BROMIDE 100 MG/10ML IV SOLN
INTRAVENOUS | Status: DC | PRN
  Administered 2015-05-11 (×2): 10 mg via INTRAVENOUS
  Administered 2015-05-11: 50 mg via INTRAVENOUS
  Administered 2015-05-11: 20 mg via INTRAVENOUS
  Administered 2015-05-11: 10 mg via INTRAVENOUS
  Administered 2015-05-11: 20 mg via INTRAVENOUS

## 2015-05-11 MED ORDER — PHENYLEPHRINE HCL 10 MG/ML IJ SOLN
INTRAMUSCULAR | Status: DC | PRN
Start: 2015-05-11 — End: 2015-05-11
  Administered 2015-05-11 (×3): 80 ug via INTRAVENOUS
  Administered 2015-05-11: 40 ug via INTRAVENOUS
  Administered 2015-05-11: 80 ug via INTRAVENOUS

## 2015-05-11 MED ORDER — FENTANYL CITRATE (PF) 100 MCG/2ML IJ SOLN
INTRAMUSCULAR | Status: DC | PRN
  Administered 2015-05-11: 50 ug via INTRAVENOUS
  Administered 2015-05-11: 150 ug via INTRAVENOUS
  Administered 2015-05-11: 50 ug via INTRAVENOUS

## 2015-05-11 MED ORDER — OXYCODONE HCL 5 MG PO TABS
20.0000 mg | ORAL_TABLET | Freq: Every day | ORAL | Status: DC | PRN
  Administered 2015-05-11 – 2015-05-12 (×3): 30 mg via ORAL
  Filled 2015-05-11 (×3): qty 6

## 2015-05-11 MED ORDER — SUGAMMADEX SODIUM 500 MG/5ML IV SOLN
INTRAVENOUS | Status: DC | PRN
  Administered 2015-05-11: 400 mg via INTRAVENOUS

## 2015-05-11 MED ORDER — MORPHINE SULFATE (PF) 2 MG/ML IV SOLN
1.0000 mg | INTRAVENOUS | Status: DC | PRN
  Administered 2015-05-11: 2 mg via INTRAVENOUS
  Filled 2015-05-11: qty 1

## 2015-05-11 MED ORDER — HYDROMORPHONE HCL 1 MG/ML IJ SOLN
INTRAMUSCULAR | Status: AC
  Administered 2015-05-11: 0.5 mg via INTRAVENOUS
  Filled 2015-05-11: qty 1

## 2015-05-11 MED ORDER — FENTANYL CITRATE (PF) 250 MCG/5ML IJ SOLN
INTRAMUSCULAR | Status: AC
  Filled 2015-05-11: qty 5

## 2015-05-11 MED ORDER — ROCURONIUM BROMIDE 50 MG/5ML IV SOLN
INTRAVENOUS | Status: AC
  Filled 2015-05-11: qty 1

## 2015-05-11 MED ORDER — MEPERIDINE HCL 25 MG/ML IJ SOLN: 6.2500 mg | INTRAMUSCULAR | Status: DC | PRN

## 2015-05-11 MED ORDER — MIDAZOLAM HCL 5 MG/5ML IJ SOLN
INTRAMUSCULAR | Status: DC | PRN
  Administered 2015-05-11: 2 mg via INTRAVENOUS

## 2015-05-11 MED ORDER — FLUTICASONE PROPIONATE 50 MCG/ACT NA SUSP
1.0000 | Freq: Every day | NASAL | Status: DC | PRN
  Filled 2015-05-11: qty 16

## 2015-05-11 MED ORDER — SUGAMMADEX SODIUM 500 MG/5ML IV SOLN
INTRAVENOUS | Status: AC
  Filled 2015-05-11: qty 5

## 2015-05-11 MED ORDER — HEPARIN SODIUM (PORCINE) 5000 UNIT/ML IJ SOLN
5000.0000 [IU] | Freq: Three times a day (TID) | INTRAMUSCULAR | Status: DC
  Administered 2015-05-12: 5000 [IU] via SUBCUTANEOUS
  Filled 2015-05-11: qty 1

## 2015-05-11 MED ORDER — MIDAZOLAM HCL 2 MG/2ML IJ SOLN
INTRAMUSCULAR | Status: AC
  Filled 2015-05-11: qty 2

## 2015-05-11 MED ORDER — PROPOFOL 10 MG/ML IV BOLUS
INTRAVENOUS | Status: DC | PRN
  Administered 2015-05-11: 150 mg via INTRAVENOUS

## 2015-05-11 MED ORDER — LACTATED RINGERS IV SOLN
INTRAVENOUS | Status: DC | PRN
  Administered 2015-05-11 (×2): via INTRAVENOUS

## 2015-05-11 MED ORDER — LIDOCAINE HCL (CARDIAC) 20 MG/ML IV SOLN
INTRAVENOUS | Status: DC | PRN
  Administered 2015-05-11: 100 mg via INTRAVENOUS

## 2015-05-11 MED ORDER — PROMETHAZINE HCL 25 MG/ML IJ SOLN: 12.5000 mg | Freq: Four times a day (QID) | INTRAMUSCULAR | Status: DC | PRN

## 2015-05-11 MED ORDER — ONDANSETRON HCL 4 MG/2ML IJ SOLN: 4.0000 mg | Freq: Four times a day (QID) | INTRAMUSCULAR | Status: DC | PRN

## 2015-05-11 MED ORDER — PANTOPRAZOLE SODIUM 40 MG IV SOLR
40.0000 mg | Freq: Every day | INTRAVENOUS | Status: DC
Start: 2015-05-11 — End: 2015-05-11

## 2015-05-11 MED ORDER — GABAPENTIN 800 MG PO TABS: 800.0000 mg | ORAL_TABLET | Freq: Four times a day (QID) | ORAL | Status: DC

## 2015-05-11 MED ORDER — TAMSULOSIN HCL 0.4 MG PO CAPS
0.4000 mg | ORAL_CAPSULE | Freq: Every day | ORAL | Status: DC
  Administered 2015-05-12: 0.4 mg via ORAL
  Filled 2015-05-11: qty 1

## 2015-05-11 MED ORDER — FLUOXETINE HCL 40 MG PO CAPS: 40.0000 mg | ORAL_CAPSULE | Freq: Every day | ORAL | Status: DC

## 2015-05-11 MED ORDER — CLONAZEPAM 1 MG PO TABS
1.0000 mg | ORAL_TABLET | Freq: Every evening | ORAL | Status: DC | PRN
  Administered 2015-05-11: 1 mg via ORAL
  Filled 2015-05-11: qty 1

## 2015-05-11 MED ORDER — ONDANSETRON 4 MG PO TBDP: 4.0000 mg | ORAL_TABLET | Freq: Four times a day (QID) | ORAL | Status: DC | PRN

## 2015-05-11 MED ORDER — PANTOPRAZOLE SODIUM 40 MG PO TBEC
40.0000 mg | DELAYED_RELEASE_TABLET | Freq: Every day | ORAL | Status: DC
  Administered 2015-05-11: 40 mg via ORAL
  Filled 2015-05-11: qty 1

## 2015-05-11 MED ORDER — SODIUM CHLORIDE 0.9 % IR SOLN
Status: DC | PRN
  Administered 2015-05-11: 1000 mL

## 2015-05-11 MED ORDER — ACETAMINOPHEN 500 MG PO TABS
1000.0000 mg | ORAL_TABLET | Freq: Four times a day (QID) | ORAL | Status: DC
  Administered 2015-05-11 – 2015-05-12 (×3): 1000 mg via ORAL
  Filled 2015-05-11 (×3): qty 2

## 2015-05-11 MED ORDER — DIPHENHYDRAMINE HCL 12.5 MG/5ML PO ELIX: 12.5000 mg | ORAL_SOLUTION | Freq: Four times a day (QID) | ORAL | Status: DC | PRN

## 2015-05-11 MED ORDER — LACTATED RINGERS IV SOLN
INTRAVENOUS | Status: DC
  Administered 2015-05-11: 08:00:00 via INTRAVENOUS

## 2015-05-11 MED ORDER — PHENYLEPHRINE 40 MCG/ML (10ML) SYRINGE FOR IV PUSH (FOR BLOOD PRESSURE SUPPORT)
PREFILLED_SYRINGE | INTRAVENOUS | Status: AC
  Filled 2015-05-11: qty 10

## 2015-05-11 MED ORDER — BUPIVACAINE-EPINEPHRINE (PF) 0.25% -1:200000 IJ SOLN
INTRAMUSCULAR | Status: AC
  Filled 2015-05-11: qty 30

## 2015-05-11 MED ORDER — 0.9 % SODIUM CHLORIDE (POUR BTL) OPTIME
TOPICAL | Status: DC | PRN
  Administered 2015-05-11: 1000 mL

## 2015-05-11 MED ORDER — PROPOFOL 10 MG/ML IV BOLUS
INTRAVENOUS | Status: AC
  Filled 2015-05-11: qty 20

## 2015-05-11 MED ORDER — CYCLOBENZAPRINE HCL 10 MG PO TABS
10.0000 mg | ORAL_TABLET | Freq: Two times a day (BID) | ORAL | Status: DC | PRN
Start: 2015-05-11 — End: 2015-05-12
  Administered 2015-05-11: 10 mg via ORAL
  Filled 2015-05-11: qty 1

## 2015-05-11 MED ORDER — ONDANSETRON HCL 4 MG/2ML IJ SOLN: 4.0000 mg | Freq: Once | INTRAMUSCULAR | Status: DC | PRN

## 2015-05-11 MED ORDER — HYDROMORPHONE HCL 1 MG/ML IJ SOLN
0.2500 mg | INTRAMUSCULAR | Status: DC | PRN
  Administered 2015-05-11 (×4): 0.5 mg via INTRAVENOUS

## 2015-05-11 MED ORDER — EPHEDRINE SULFATE 50 MG/ML IJ SOLN
INTRAMUSCULAR | Status: DC | PRN
  Administered 2015-05-11: 10 mg via INTRAVENOUS
  Administered 2015-05-11: 5 mg via INTRAVENOUS

## 2015-05-11 SURGICAL SUPPLY — 52 items
ADH SKN CLS APL DERMABOND .7 (GAUZE/BANDAGES/DRESSINGS) ×1
APL SKNCLS STERI-STRIP NONHPOA (GAUZE/BANDAGES/DRESSINGS)
APPLIER CLIP 5 13 M/L LIGAMAX5 (MISCELLANEOUS)
APPLIER CLIP ROT 10 11.4 M/L (STAPLE)
APR CLP MED LRG 11.4X10 (STAPLE)
APR CLP MED LRG 5 ANG JAW (MISCELLANEOUS)
BENZOIN TINCTURE PRP APPL 2/3 (GAUZE/BANDAGES/DRESSINGS) IMPLANT
BLADE SURG ROTATE 9660 (MISCELLANEOUS) ×1 IMPLANT
CANISTER SUCTION 2500CC (MISCELLANEOUS) IMPLANT
CHLORAPREP W/TINT 26ML (MISCELLANEOUS) ×2 IMPLANT
CLIP APPLIE 5 13 M/L LIGAMAX5 (MISCELLANEOUS) IMPLANT
CLIP APPLIE ROT 10 11.4 M/L (STAPLE) IMPLANT
COVER SURGICAL LIGHT HANDLE (MISCELLANEOUS) ×2 IMPLANT
DERMABOND ADVANCED (GAUZE/BANDAGES/DRESSINGS) ×1
DERMABOND ADVANCED .7 DNX12 (GAUZE/BANDAGES/DRESSINGS) IMPLANT
DEVICE SECURE STRAP 25 ABSORB (INSTRUMENTS) ×2 IMPLANT
DRAPE INCISE IOBAN 66X45 STRL (DRAPES) ×1 IMPLANT
DRSG TEGADERM 4X4.75 (GAUZE/BANDAGES/DRESSINGS) IMPLANT
ELECT REM PT RETURN 9FT ADLT (ELECTROSURGICAL) ×2
ELECTRODE REM PT RTRN 9FT ADLT (ELECTROSURGICAL) ×1 IMPLANT
GAUZE SPONGE 2X2 8PLY STRL LF (GAUZE/BANDAGES/DRESSINGS) IMPLANT
GLOVE BIO SURGEON STRL SZ8 (GLOVE) ×3 IMPLANT
GLOVE BIOGEL M STRL SZ7.5 (GLOVE) ×2 IMPLANT
GLOVE BIOGEL PI IND STRL 7.0 (GLOVE) IMPLANT
GLOVE BIOGEL PI IND STRL 7.5 (GLOVE) IMPLANT
GLOVE BIOGEL PI IND STRL 8.5 (GLOVE) IMPLANT
GLOVE BIOGEL PI INDICATOR 7.0 (GLOVE) ×1
GLOVE BIOGEL PI INDICATOR 7.5 (GLOVE) ×1
GLOVE BIOGEL PI INDICATOR 8.5 (GLOVE) ×2
GLOVE SURG SS PI 6.5 STRL IVOR (GLOVE) ×1 IMPLANT
GOWN STRL REUS W/ TWL LRG LVL3 (GOWN DISPOSABLE) ×2 IMPLANT
GOWN STRL REUS W/ TWL XL LVL3 (GOWN DISPOSABLE) ×1 IMPLANT
GOWN STRL REUS W/TWL LRG LVL3 (GOWN DISPOSABLE) ×4
GOWN STRL REUS W/TWL XL LVL3 (GOWN DISPOSABLE) ×2
KIT BASIN OR (CUSTOM PROCEDURE TRAY) ×2 IMPLANT
KIT ROOM TURNOVER OR (KITS) ×2 IMPLANT
LIQUID BAND (GAUZE/BANDAGES/DRESSINGS) IMPLANT
MESH 3DMAX 4X6 RT LRG (Mesh General) ×1 IMPLANT
NS IRRIG 1000ML POUR BTL (IV SOLUTION) ×2 IMPLANT
PAD ARMBOARD 7.5X6 YLW CONV (MISCELLANEOUS) ×4 IMPLANT
SCISSORS LAP 5X35 DISP (ENDOMECHANICALS) ×2 IMPLANT
SET IRRIG TUBING LAPAROSCOPIC (IRRIGATION / IRRIGATOR) ×1 IMPLANT
SLEEVE ENDOPATH XCEL 5M (ENDOMECHANICALS) ×1 IMPLANT
SPONGE GAUZE 2X2 STER 10/PKG (GAUZE/BANDAGES/DRESSINGS)
SUT MNCRL AB 4-0 PS2 18 (SUTURE) ×2 IMPLANT
SUT VICRYL 0 UR6 27IN ABS (SUTURE) ×1 IMPLANT
TOWEL OR 17X24 6PK STRL BLUE (TOWEL DISPOSABLE) ×2 IMPLANT
TOWEL OR 17X26 10 PK STRL BLUE (TOWEL DISPOSABLE) ×2 IMPLANT
TRAY LAPAROSCOPIC MC (CUSTOM PROCEDURE TRAY) ×2 IMPLANT
TROCAR XCEL BLADELESS 5X75MML (TROCAR) ×4 IMPLANT
TROCAR XCEL BLUNT TIP 100MML (ENDOMECHANICALS) ×2 IMPLANT
TUBING INSUFFLATION (TUBING) ×2 IMPLANT

## 2015-05-11 NOTE — Anesthesia Preprocedure Evaluation (Addendum)
Anesthesia Evaluation  Patient identified by MRN, date of birth, ID band  Reviewed: Allergy & Precautions, NPO status , Patient's Chart, lab work & pertinent test results  Airway Mallampati: II  TM Distance: >3 FB Neck ROM: Full    Dental  (+) Teeth Intact, Dental Advidsory Given   Pulmonary sleep apnea , former smoker,    Pulmonary exam normal        Cardiovascular hypertension, Pt. on medications + CAD, + Past MI and + Peripheral Vascular Disease  Normal cardiovascular exam     Neuro/Psych  Headaches, PSYCHIATRIC DISORDERS    GI/Hepatic   Endo/Other  diabetes, Type 2, Oral Hypoglycemic Agents  Renal/GU      Musculoskeletal   Abdominal   Peds  Hematology  (+) anemia ,   Anesthesia Other Findings   Reproductive/Obstetrics                            Anesthesia Physical Anesthesia Plan  ASA: II  Anesthesia Plan: General   Post-op Pain Management:    Induction: Intravenous  Airway Management Planned: Oral ETT  Additional Equipment:   Intra-op Plan:   Post-operative Plan: Extubation in OR  Informed Consent: I have reviewed the patients History and Physical, chart, labs and discussed the procedure including the risks, benefits and alternatives for the proposed anesthesia with the patient or authorized representative who has indicated his/her understanding and acceptance.   Dental Advisory Given  Plan Discussed with: CRNA and Surgeon  Anesthesia Plan Comments:        Anesthesia Quick Evaluation

## 2015-05-11 NOTE — Op Note (Addendum)
Eliud Polo [id number] 05/11/2015  Jan 14, 1951  PREOPERATIVE DIAGNOSIS: right inguinal hernia  POSTOPERATIVE DIAGNOSIS: right indirect inguinal hernia  PROCEDURE: Laparoscopic repair of right indirect inguinal hernia with mesh  SURGEON: Gayland Curry MD FACS  ASSISTANT SURGEON: None.   ANESTHESIA: General plus local consisting of 0.25% Marcaine with epi.   ESTIMATED BLOOD LOSS: Minimal.   FINDINGS: The patient had a right indirect inguinal hernia.  It was repaired using a LARGE BARD 3D Max Mesh  SPECIMEN: none  INDICATIONS FOR PROCEDURE: 64 yo gentleman with symptomatic right inguinal hernia who desired surgical repair. Please see note for additional details.  The risks and benefits including but not limited to bleeding, infection, chronic inguinal pain, nerve entrapment, hernia recurrence, mesh complications, hematoma formation, urinary retention, injury to the testicles or the ovaries, numbness in the groin, blood clots, injury to the surrounding structures, and anesthesia risk was discussed with the patient.  DESCRIPTION OF PROCEDURE: After obtaining verbal consent and marking  the right groin in the holding area with the patient confirming the  operative site, the patient was then taken back to the operating room, placed  supine on the operating room table. General endotracheal anesthesia was  established. The patient had emptied their bladder prior to going back to  the operating room. Sequential compression devices were placed. The  abdomen and groin were prepped and draped in the usual standard surgical  fashion with ChloraPrep. The patient received IV  antibiotics prior to the incision. A surgical time-out was performed.  Local was infiltrated at the base of the umbilicus.   Next, a 1-cm vertical infraumbilical incision was made with a #11 blade. The fascia  was grasped and lifted anteriorly. Next, the fascia was incised, and  the abdominal cavity was entered.  Pursestring suture was placed around  the fascial edges using a 0 Vicryl. A 12-mm Hasson trocar was placed.  Pneumoperitoneum was smoothly established up to a patient pressure of 15  mmHg. Laparoscope was advanced. There was no evidence of a  contralateral hernia. The patient had a defect lateral to  the inferior epigastric vessel, consistent with an indirect right  hernia. Two 5-mm trocars were placed, one on the right, one on the left  in the midclavicular line slightly above the level of the umbilicus all  under direct visualization. It was evident that he had some type of prior umbilical hernia repair. There was a small piece of mesh above the peritoneum above the umbilicus.  After local had been infiltrated, I then  made incision along the peritoneum on the right, starting 2 inches above  the anterior superior iliac spine and caring it medial  toward the median umbilical ligament in a lazy S configuration using  Endo Shears with electrocautery. Viewing the right lateral lower abdominal wall was challenging thru the 90m right trocar. I decided to place another 571mtrocar on the right slightly higher in the abdomen under direct visualization. This provided the more typical view of the right groin. The peritoneal flap was then gently  dissected downward from the anterior abdominal wall taking care not to  injure the inferior epigastric vessels. The plane between the peritoneum and posterior fascia was fused in the lower right midabdomen. I was eventually able to separate the pertioneal flap from the posterior fascia.  The pubic bone was identified.  The testicular vessels were identified.  Using  traction and counter traction with short graspers, I reduced the large sac in  its entirety. The  testicular vessels had been identified and preserved. The vas deferens was identified and preserved, and the hernia sac was stripped from those to  surrounding structures. I then went about creating a large  pocket by  lifting the peritoneum of the pelvic floor. I took great care not to  injure the iliac vessels.    Local anesthetic was injected 2 finger breadths below and medial to the anterior superior iliac spine as well as along the right groin prior to placing the mesh. I then obtained a piece of LARGE 3D BARD MAX mesh & placed it through the Hasson trocar, half of it covered medial  to the inferior epigastric vessels and half of it lateral to the  inferior epigastric vessels. The defect was well  covered with the mesh. I then secured the mesh to the abdominal wall  using an Ethicon secure strap tack. Tacks were placed through  the Cooper's ligament, one tack superior medial edge of mesh, one tack on lateral side of the inferior epigastric  vessel and two tacks out laterally. No tacks were placed below the  shelving edge of the inguinal ligament. Pneumoperitoneum was reduced  to 8 mmHg. I then brought the peritoneal flap back up to the abdominal  wall and tacked it to the abdominal wall using 4 tacks. There was no  defect in the peritoneum, and the mesh was well covered. I removed the  Hasson trocar and tied down the previously placed pursestring suture. I placed another interrupted 0 vicryl suture at the umbilical fascia. The closure was viewed laparoscopically. There was no evidence of  fascial defect. There was no air leak at the umbilicus. There was no  evidence of injury to surrounding structures. Pneumoperitoneum was  released, and the remaining trocars were removed. All skin incisions  were closed with a 4-0 Monocryl in a subcuticular fashion followed by  application of Dermabond. All needle, instrument, and sponge counts  were correct x2. There are no immediate complications. The patient  tolerated the procedure well. The patient was extubated and taken to the  recovery room in stable condition.  Leighton Ruff. Redmond Pulling, MD, FACS General, Bariatric, & Minimally Invasive Surgery [facility] [place] [facility], [place]

## 2015-05-11 NOTE — Anesthesia Procedure Notes (Signed)
Procedure Name: Intubation Date/Time: 05/11/2015 9:42 AM Performed by: Neldon Newport Pre-anesthesia Checklist: Patient being monitored, Suction available, Emergency Drugs available, Patient identified and Timeout performed Patient Re-evaluated:Patient Re-evaluated prior to inductionOxygen Delivery Method: Circle system utilized Preoxygenation: Pre-oxygenation with 100% oxygen Intubation Type: IV induction Ventilation: Mask ventilation without difficulty Laryngoscope Size: Mac and 3 Grade View: Grade I Tube type: Oral Tube size: 7.5 mm Number of attempts: 1 Placement Confirmation: positive ETCO2,  ETT inserted through vocal cords under direct vision and breath sounds checked- equal and bilateral Secured at: 23 cm Tube secured with: Tape Dental Injury: Teeth and Oropharynx as per pre-operative assessment

## 2015-05-11 NOTE — Anesthesia Postprocedure Evaluation (Signed)
Anesthesia Post Note  Patient: Travis Tindel Sr.  Procedure(s) Performed: Procedure(s) (LRB): LAPAROSCOPIC REPAIR RIGHT  INGUINAL HERNIA (Right) INSERTION OF MESH (Right)  Patient location during evaluation: PACU Anesthesia Type: General Level of consciousness: awake and alert Pain management: pain level controlled Vital Signs Assessment: post-procedure vital signs reviewed and stable Respiratory status: spontaneous breathing, nonlabored ventilation, respiratory function stable and patient connected to nasal cannula oxygen Cardiovascular status: blood pressure returned to baseline and stable Postop Assessment: no signs of nausea or vomiting Anesthetic complications: no    Last Vitals:  Filed Vitals:   05/11/15 1326 05/11/15 1352  BP:  120/65  Pulse:  68  Temp: 36.4 C 36.5 C  Resp:      Last Pain:  Filed Vitals:   05/11/15 1353  PainSc: 7                  Saraya Tirey DAVID

## 2015-05-11 NOTE — Interval H&P Note (Signed)
History and Physical Interval Note:  05/11/2015 9:26 AM  Travis Fortis Sr.  has presented today for surgery, with the diagnosis of right inguinal hernia  The various methods of treatment have been discussed with the patient and family. After consideration of risks, benefits and other options for treatment, the patient has consented to  Procedure(s): LAPAROSCOPIC REPAIR RIGHT  INGUINAL HERNIA (Right) INSERTION OF MESH (Right) as a surgical intervention .  The patient's history has been reviewed, patient examined, no change in status, stable for surgery.  I have reviewed the patient's chart and labs.  Questions were answered to the patient's satisfaction.    Leighton Ruff. Redmond Pulling, MD, [place], Bariatric, & Minimally Invasive Surgery [facility], [place]  [facility]

## 2015-05-11 NOTE — Transfer of Care (Signed)
Immediate Anesthesia Transfer of Care Note  Patient: Travis [name].  Procedure(s) Performed: Procedure(s): LAPAROSCOPIC REPAIR RIGHT  INGUINAL HERNIA (Right) INSERTION OF MESH (Right)  Patient Location: PACU  Anesthesia Type:General  Level of Consciousness: awake, alert  and oriented  Airway & Oxygen Therapy: Patient Spontanous Breathing and Patient connected to face mask oxygen  Post-op Assessment: Report given to RN, Post -op Vital signs reviewed and stable and Patient moving all extremities X 4  Post vital signs: Reviewed and stable  Last Vitals:  Filed Vitals:   05/11/15 0750 05/11/15 1156  BP: 122/52   Pulse: 59   Temp: 36.9 C 36.6 C  Resp: 18     Complications: No apparent anesthesia complications

## 2015-05-12 ENCOUNTER — Encounter (HOSPITAL_COMMUNITY): Payer: Self-pay | Admitting: General Surgery

## 2015-05-12 DIAGNOSIS — K4090 Unilateral inguinal hernia, without obstruction or gangrene, not specified as recurrent: Secondary | ICD-10-CM | POA: Diagnosis not present

## 2015-05-12 LAB — GLUCOSE, CAPILLARY: Glucose-Capillary: 124 mg/dL — ABNORMAL HIGH (ref 65–99)

## 2015-05-12 LAB — CBC
HEMATOCRIT: 34.9 % — AB (ref 39.0–52.0)
Hemoglobin: 11.7 g/dL — ABNORMAL LOW (ref 13.0–17.0)
MCH: 30.2 pg (ref 26.0–34.0)
MCHC: 33.5 g/dL (ref 30.0–36.0)
MCV: 89.9 fL (ref 78.0–100.0)
Platelets: 204 10*3/uL (ref 150–400)
RBC: 3.88 MIL/uL — AB (ref 4.22–5.81)
RDW: 13.6 % (ref 11.5–15.5)
WBC: 4.6 10*3/uL (ref 4.0–10.5)

## 2015-05-12 MED ORDER — OXYCODONE HCL 5 MG PO TABS: 5.0000 mg | ORAL_TABLET | ORAL | Status: DC | PRN

## 2015-05-12 NOTE — Progress Notes (Signed)
Pt discharged to home.  Discharge instructions explained to pt.  Pt has no questions at the time of discharge.  IV removed.  Pt states he has all belongings.  Pt taken off unit via wheelchair by volunteer services.  Pt states he is driving himself home.  Pt states he feels comfortable with driving home.

## 2015-05-12 NOTE — Discharge Summary (Signed)
Physician Discharge Summary  Travis Radu Sr. RFF:[id number] DOB: [birth year] DOA: 05/11/2015  PCP: [name], MD  Admit date: 05/11/2015 Discharge date: 05/12/2015  Recommendations for Outpatient Follow-up:   Follow-up Information    Follow up with Gayland Curry, MD. Go on 06/03/2015.   Specialty:  General Surgery   Why:  at 3:30 pm (arrive at 3:15 PM)   Contact information:   [address] [place] [address] [phone]      Discharge Diagnoses:  Principal Problem:   Right inguinal hernia Active Problems:   Depression   Chronic pain syndrome   Essential hypertension   CAD (coronary artery disease)   Obesity   Uncontrolled type 2 DM with peripheral circulatory disorder (HCC)   Chronic anticoagulation   S/P laparoscopic hernia repair   Surgical Procedure: LAPAROSCOPIC REPAIR OF RIGHT INDIRECT INGUINAL HERNIA WITH MESH   Discharge Condition: good Disposition: home  Diet recommendation: cardiac  Filed Weights   05/11/15 0734  Weight: 130.182 kg (287 lb)   Hospital Course:  He came in for planned laparoscopic repair of right inguinal hernia with mesh for symptomatic inguinal hernia. We received preop cardiac clearance as well as permission to hold blood thinner. Please see op note for addl details. He was kept overnight for observation since he had no way home other than a cab which i didn't feel was safe. On POD 1, he was having expected right groin pain from his surgery. However his vitals were normal, he was tolerating a diet, he was voiding spontaneously, he was ambulating, he had no chest pain, SOB.   We discussed dc instructions. Since he was 'oozy' during surgery, I recommended he wait to restart his blood thinner til Friday. He is under a pain contract. I gave him some additional oxycodone  BP 125/62 mmHg  Pulse 70  Temp(Src) 98.5 F (36.9 C) (Oral)  Resp 18  Ht 6' 1"  (1.854 m)  Wt 130.182 kg (287 lb)  BMI 37.87 kg/m2  SpO2 96%  Gen:  alert, NAD, non-toxic appearing Pupils: equal, no scleral icterus Pulm: Lungs clear to auscultation, symmetric chest rise CV: regular rate and rhythm Abd: soft, nontender, nondistended. . No cellulitis. No incisional hernia GU: expected mild TTP in right groin, no hematoma. Some right testicular soreness Ext: no edema, no calf tenderness Skin: no rash, no jaundice    Discharge Instructions      Discharge Instructions    Call MD for:  difficulty breathing, headache or visual disturbances    Complete by:  As directed      Call MD for:  persistant nausea and vomiting    Complete by:  As directed      Call MD for:  redness, tenderness, or signs of infection (pain, swelling, redness, odor or green/yellow discharge around incision site)    Complete by:  As directed      Call MD for:  severe uncontrolled pain    Complete by:  As directed      Call MD for:    Complete by:  As directed   TEMP >101     Diet - low sodium heart healthy    Complete by:  As directed      Discharge instructions    Complete by:  As directed   RESUME EFFIENT (PRASUGREL) - BLOOD THINNER- ON Friday   SEE [facility] DISCHARGE INSTRUCTIONS     Increase activity slowly    Complete by:  As directed  Medication List    TAKE these medications        Adjustable Lancing Device Misc     ARIPiprazole 5 MG tablet  Commonly known as:  ABILIFY  Take 1 tablet (5 mg total) by mouth at bedtime.     aspirin 81 MG tablet  Take 1 tablet (81 mg total) by mouth daily. Resume in 1 week after FU with PCP     ASSURE COMFORT LANCETS 30G Misc     atorvastatin 20 MG tablet  Commonly known as:  LIPITOR  TAKE 1 TABLET BY MOUTH EVERY DAY     clonazePAM 1 MG tablet  Commonly known as:  KLONOPIN  TAKE 1 TABLET BY MOUTH AT BEDTIME AS NEEDED FOR ANXIETY     cyclobenzaprine 10 MG tablet  Commonly known as:  FLEXERIL  Take 1 tablet by mouth 2 (two) times daily as needed.     EMBRACE BLOOD GLUCOSE MONITOR Devi      EMBRACE BLOOD GLUCOSE TEST test strip  Generic drug:  glucose blood     EMBRACE CONTROL LOW Soln     Fish Oil 1000 MG Caps  Take 1,000 mg by mouth daily.     FLUoxetine 40 MG capsule  Commonly known as:  PROZAC  Take 1 capsule (40 mg total) by mouth daily.     fluticasone 50 MCG/ACT nasal spray  Commonly known as:  FLONASE  Place 1 spray into both nostrils daily as needed (congestion).     gabapentin 800 MG tablet  Commonly known as:  NEURONTIN  Take 1 tablet by mouth 4 (four) times daily.     metFORMIN 500 MG tablet  Commonly known as:  GLUCOPHAGE  Take 1 tablet (500 mg total) by mouth 2 (two) times daily with a meal.     oxycodone 30 MG immediate release tablet  Commonly known as:  ROXICODONE  Take 30 mg by mouth 5 (five) times daily as needed for pain.     oxyCODONE 5 MG immediate release tablet  Commonly known as:  Oxy IR/ROXICODONE  Take 1-2 tablets (5-10 mg total) by mouth every 4 (four) hours as needed for moderate pain, severe pain or breakthrough pain.     oxymorphone 10 MG tablet  Commonly known as:  OPANA  TAKE 1 TAB TWICE DAILY AS NEEDED FOR BREAKTHROUGH PAIN  (PAIN FLARE)     pantoprazole 40 MG tablet  Commonly known as:  PROTONIX  Take 1 tablet (40 mg total) by mouth daily.     prasugrel 10 MG Tabs tablet  Commonly known as:  EFFIENT  Take 10 mg by mouth daily.     sildenafil 50 MG tablet  Commonly known as:  VIAGRA  Take 1 tablet (50 mg total) by mouth daily as needed for erectile dysfunction.     tamsulosin 0.4 MG Caps capsule  Commonly known as:  FLOMAX  Take 0.4 mg by mouth daily after breakfast.     traZODone 100 MG tablet  Commonly known as:  DESYREL  Take 1 tablet (100 mg total) by mouth at bedtime.       Follow-up Information    Follow up with Gayland Curry, MD. Go on 06/03/2015.   Specialty:  General Surgery   Why:  at 3:30 pm (arrive at 3:15 PM)   Contact information:   [place] [address] [phone]         The results of significant diagnostics from this hospitalization (including imaging,  microbiology, ancillary and laboratory) are listed below for reference.    Significant Diagnostic Studies: No results found.  Microbiology: No results found for this or any previous visit (from the past 240 hour(s)).   Labs: Basic Metabolic Panel:  Recent Labs Lab 05/10/15 1327  NA 140  K 4.3  CL 105  CO2 25  GLUCOSE 109*  BUN 21*  CREATININE 0.89  CALCIUM 9.0   Liver Function Tests: No results for input(s): AST, ALT, ALKPHOS, BILITOT, PROT, ALBUMIN in the last 168 hours. No results for input(s): LIPASE, AMYLASE in the last 168 hours. No results for input(s): AMMONIA in the last 168 hours. CBC:  Recent Labs Lab 05/10/15 1327 05/12/15 0600  WBC 4.0 4.6  NEUTROABS 2.2  --   HGB 11.7* 11.7*  HCT 35.1* 34.9*  MCV 88.9 89.9  PLT 200 204   Cardiac Enzymes: No results for input(s): CKTOTAL, CKMB, CKMBINDEX, TROPONINI in the last 168 hours. BNP: BNP (last 3 results) No results for input(s): BNP in the last 8760 hours.  ProBNP (last 3 results) No results for input(s): PROBNP in the last 8760 hours.  CBG:  Recent Labs Lab 05/10/15 1306 05/11/15 0746 05/11/15 1157 05/11/15 1836 05/11/15 2102  GLUCAP 138* 107* 125* 120* 116*    Principal Problem:   Right inguinal hernia Active Problems:   Depression   Chronic pain syndrome   Essential hypertension   CAD (coronary artery disease)   Obesity   Uncontrolled type 2 DM with peripheral circulatory disorder (HCC)   Chronic anticoagulation   S/P laparoscopic hernia repair   Time coordinating discharge: 15 minutes  Signed:  Gayland Curry, MD [facility], [place] [phone] 05/12/2015, 7:41 AM

## 2015-05-12 NOTE — Discharge Instructions (Signed)
[place] [facility], [place]  RESUME BLOOD THINNER - EFFIENT ON Friday  UMBILICAL OR INGUINAL HERNIA REPAIR: POST OP INSTRUCTIONS  Always review your discharge instruction sheet given to you by the facility where your surgery was performed. IF YOU HAVE DISABILITY OR FAMILY LEAVE FORMS, YOU MUST BRING THEM TO THE OFFICE FOR PROCESSING.   DO NOT GIVE THEM TO YOUR DOCTOR.  1. A  prescription for pain medication may be given to you upon discharge.  Take your pain medication as prescribed, if needed.  If narcotic pain medicine is not needed, then you may take acetaminophen (Tylenol) or ibuprofen (Advil) as needed. 2. Take your usually prescribed medications unless otherwise directed. 3. If you need a refill on your pain medication, please contact your pharmacy.  They will contact our office to request authorization. Prescriptions will not be filled after 5 pm or on week-ends. 4. You should follow a light diet the first 24 hours after arrival home, such as soup and crackers, etc.  Be sure to include lots of fluids daily.  Resume your normal diet the day after surgery. 5. Most patients will experience some swelling and bruising around the umbilicus or in the groin and scrotum.  Ice packs and reclining will help.  Swelling and bruising can take several days to resolve.  6. It is common to experience some constipation if taking pain medication after surgery.  Increasing fluid intake and taking a stool softener (such as Colace) will usually help or prevent this problem from occurring.  A mild laxative (Milk of Magnesia or Miralax) should be taken according to package directions if there are no bowel movements after 48 hours. 7. Unless discharge instructions indicate otherwise, you may remove your bandages 24-48 hours after surgery, and you may shower at that time.  You may have steri-strips (small skin tapes) in place directly over the incision.  These strips should be left on the skin for 7-10 days.  If  your surgeon used skin glue on the incision, you may shower in 24 hours.  The glue will flake off over the next 2-3 weeks.  Any sutures or staples will be removed at the office during your follow-up visit. 8. ACTIVITIES:  You may resume regular (light) daily activities beginning the next day--such as daily self-care, walking, climbing stairs--gradually increasing activities as tolerated.  You may have sexual intercourse when it is comfortable.  Refrain from any heavy lifting or straining until approved by your doctor. a. You may drive when you are no longer taking prescription pain medication, you can comfortably wear a seatbelt, and you can safely maneuver your car and apply brakes. b. RETURN TO WORK:  9. You should see your doctor in the office for a follow-up appointment approximately 2-3 weeks after your surgery.  Make sure that you call for this appointment within a day or two after you arrive home to insure a convenient appointment time. 10. OTHER INSTRUCTIONS: DO NOT LIFT, PUSH, OR PULL ANYTHING GREATER THAN 15 POUNDS FOR 6 WEEKS    WHEN TO CALL YOUR DOCTOR: 1. Fever over 101.0 2. Inability to urinate 3. Nausea and/or vomiting 4. Extreme swelling or bruising 5. Continued bleeding from incision. 6. Increased pain, redness, or drainage from the incision  The clinic staff is available to answer your questions during regular business hours.  Please dont hesitate to call and ask to speak to one of the nurses for clinical concerns.  If you have a medical emergency, go to the nearest  emergency room or call 911.  A surgeon from [facility] is always on call at the hospital   [address] [place], [place], [place]  [address] ?  [address], [place], [place]   [address] [phone] ? [phone] ? FAX [phone] Web site: [email or web address]

## 2015-05-16 ENCOUNTER — Encounter (HOSPITAL_COMMUNITY): Payer: Self-pay | Admitting: Vascular Surgery

## 2015-05-16 ENCOUNTER — Emergency Department (HOSPITAL_COMMUNITY): Payer: Medicare PPO

## 2015-05-16 ENCOUNTER — Emergency Department (HOSPITAL_COMMUNITY)
Admission: EM | Admit: 2015-05-16 | Discharge: 2015-05-16 | Disposition: A | Discharge status: Home / Self Care | Payer: Medicare PPO | Attending: Emergency Medicine | Admitting: Emergency Medicine

## 2015-05-16 DIAGNOSIS — Z7982 Long term (current) use of aspirin: Secondary | ICD-10-CM | POA: Insufficient documentation

## 2015-05-16 DIAGNOSIS — Z79899 Other long term (current) drug therapy: Secondary | ICD-10-CM | POA: Insufficient documentation

## 2015-05-16 DIAGNOSIS — Z9889 Other specified postprocedural states: Secondary | ICD-10-CM | POA: Diagnosis not present

## 2015-05-16 DIAGNOSIS — N5089 Other specified disorders of the male genital organs: Secondary | ICD-10-CM | POA: Insufficient documentation

## 2015-05-16 DIAGNOSIS — E119 Type 2 diabetes mellitus without complications: Secondary | ICD-10-CM | POA: Insufficient documentation

## 2015-05-16 DIAGNOSIS — Z87891 Personal history of nicotine dependence: Secondary | ICD-10-CM | POA: Insufficient documentation

## 2015-05-16 DIAGNOSIS — F329 Major depressive disorder, single episode, unspecified: Secondary | ICD-10-CM | POA: Diagnosis not present

## 2015-05-16 DIAGNOSIS — G8929 Other chronic pain: Secondary | ICD-10-CM | POA: Insufficient documentation

## 2015-05-16 DIAGNOSIS — I2510 Atherosclerotic heart disease of native coronary artery without angina pectoris: Secondary | ICD-10-CM | POA: Insufficient documentation

## 2015-05-16 DIAGNOSIS — R011 Cardiac murmur, unspecified: Secondary | ICD-10-CM | POA: Diagnosis not present

## 2015-05-16 DIAGNOSIS — Z8701 Personal history of pneumonia (recurrent): Secondary | ICD-10-CM | POA: Insufficient documentation

## 2015-05-16 DIAGNOSIS — I252 Old myocardial infarction: Secondary | ICD-10-CM | POA: Diagnosis not present

## 2015-05-16 DIAGNOSIS — Z87442 Personal history of urinary calculi: Secondary | ICD-10-CM | POA: Diagnosis not present

## 2015-05-16 DIAGNOSIS — E669 Obesity, unspecified: Secondary | ICD-10-CM | POA: Diagnosis not present

## 2015-05-16 DIAGNOSIS — N451 Epididymitis: Secondary | ICD-10-CM | POA: Diagnosis not present

## 2015-05-16 DIAGNOSIS — Z7984 Long term (current) use of oral hypoglycemic drugs: Secondary | ICD-10-CM | POA: Insufficient documentation

## 2015-05-16 DIAGNOSIS — Z7902 Long term (current) use of antithrombotics/antiplatelets: Secondary | ICD-10-CM | POA: Insufficient documentation

## 2015-05-16 DIAGNOSIS — F419 Anxiety disorder, unspecified: Secondary | ICD-10-CM | POA: Diagnosis not present

## 2015-05-16 DIAGNOSIS — M797 Fibromyalgia: Secondary | ICD-10-CM | POA: Diagnosis not present

## 2015-05-16 DIAGNOSIS — M1990 Unspecified osteoarthritis, unspecified site: Secondary | ICD-10-CM | POA: Insufficient documentation

## 2015-05-16 DIAGNOSIS — N50811 Right testicular pain: Secondary | ICD-10-CM | POA: Diagnosis present

## 2015-05-16 DIAGNOSIS — E782 Mixed hyperlipidemia: Secondary | ICD-10-CM | POA: Insufficient documentation

## 2015-05-16 DIAGNOSIS — I10 Essential (primary) hypertension: Secondary | ICD-10-CM | POA: Insufficient documentation

## 2015-05-16 LAB — URINALYSIS, ROUTINE W REFLEX MICROSCOPIC
Bilirubin Urine: NEGATIVE
GLUCOSE, UA: NEGATIVE mg/dL
Hgb urine dipstick: NEGATIVE
Ketones, ur: NEGATIVE mg/dL
LEUKOCYTES UA: NEGATIVE
NITRITE: NEGATIVE
PH: 5.5 (ref 5.0–8.0)
PROTEIN: NEGATIVE mg/dL
Specific Gravity, Urine: 1.024 (ref 1.005–1.030)

## 2015-05-16 LAB — CBC WITH DIFFERENTIAL/PLATELET
BASOS ABS: 0 10*3/uL (ref 0.0–0.1)
Basophils Relative: 1 %
EOS ABS: 0.2 10*3/uL (ref 0.0–0.7)
Eosinophils Relative: 5 %
HEMATOCRIT: 31.4 % — AB (ref 39.0–52.0)
Hemoglobin: 10.7 g/dL — ABNORMAL LOW (ref 13.0–17.0)
Lymphocytes Relative: 22 %
Lymphs Abs: 1.2 10*3/uL (ref 0.7–4.0)
MCH: 30.5 pg (ref 26.0–34.0)
MCHC: 34.1 g/dL (ref 30.0–36.0)
MCV: 89.5 fL (ref 78.0–100.0)
MONO ABS: 0.8 10*3/uL (ref 0.1–1.0)
MONOS PCT: 16 %
NEUTROS ABS: 3 10*3/uL (ref 1.7–7.7)
Neutrophils Relative %: 56 %
Platelets: 374 10*3/uL (ref 150–400)
RBC: 3.51 MIL/uL — AB (ref 4.22–5.81)
RDW: 13.1 % (ref 11.5–15.5)
WBC: 5.3 10*3/uL (ref 4.0–10.5)

## 2015-05-16 LAB — BASIC METABOLIC PANEL
ANION GAP: 7 (ref 5–15)
BUN: 24 mg/dL — ABNORMAL HIGH (ref 6–20)
CO2: 28 mmol/L (ref 22–32)
Calcium: 8.9 mg/dL (ref 8.9–10.3)
Chloride: 101 mmol/L (ref 101–111)
Creatinine, Ser: 1.02 mg/dL (ref 0.61–1.24)
GFR calc Af Amer: >60 mL/min (ref >60)
GLUCOSE: 121 mg/dL — AB (ref 65–99)
POTASSIUM: 5.5 mmol/L — AB (ref 3.5–5.1)
Sodium: 136 mmol/L (ref 135–145)

## 2015-05-16 LAB — I-STAT CHEM 8, ED
BUN: 24 mg/dL — ABNORMAL HIGH (ref 6–20)
CHLORIDE: 99 mmol/L — AB (ref 101–111)
Calcium, Ion: 1.13 mmol/L (ref 1.13–1.30)
Creatinine, Ser: 0.9 mg/dL (ref 0.61–1.24)
GLUCOSE: 95 mg/dL (ref 65–99)
HEMATOCRIT: 30 % — AB (ref 39.0–52.0)
Hemoglobin: 10.2 g/dL — ABNORMAL LOW (ref 13.0–17.0)
POTASSIUM: 3.8 mmol/L (ref 3.5–5.1)
Sodium: 140 mmol/L (ref 135–145)
TCO2: 28 mmol/L (ref 0–100)

## 2015-05-16 MED ORDER — POLYETHYLENE GLYCOL 3350 17 G PO PACK: 17.0000 g | PACK | Freq: Every day | ORAL | Status: DC

## 2015-05-16 MED ORDER — LEVOFLOXACIN 500 MG PO TABS: 500.0000 mg | ORAL_TABLET | Freq: Every day | ORAL | Status: DC

## 2015-05-16 MED ORDER — LEVOFLOXACIN IN D5W 500 MG/100ML IV SOLN
500.0000 mg | Freq: Once | INTRAVENOUS | Status: AC
  Administered 2015-05-16: 500 mg via INTRAVENOUS
  Filled 2015-05-16: qty 100

## 2015-05-16 MED ORDER — MORPHINE SULFATE (PF) 4 MG/ML IV SOLN
6.0000 mg | Freq: Once | INTRAVENOUS | Status: AC
  Administered 2015-05-16: 6 mg via INTRAVENOUS
  Filled 2015-05-16: qty 2

## 2015-05-16 NOTE — ED Notes (Signed)
Pt stable, ambulatory, states understanding of discharge instructions 

## 2015-05-16 NOTE — ED Provider Notes (Signed)
CSN: [id number]     Arrival date & time 05/16/15  1223 History   First MD Initiated Contact with Patient 05/16/15 1515     Chief Complaint  Patient presents with  . Testicle Pain     (Consider location/radiation/quality/duration/timing/severity/associated sxs/prior Treatment) The history is provided by the patient.  Travis Mottola Sr. is a 64 y.o. male hx of HL, HTN, CAD, recent R inguinal hernia repair here with scrotal pain. He had right inguinal hernia repair 6 days ago. He was discharged from the hospital 4 days ago. He states that his pain has been controlled the first day or 2. He has progressive right scrotal swelling for the last 3 days. The pain got much worse yesterday. Productive cough and it really gets the swelling worse. Denies any fevers or chills or vomiting. He is able to urinate.     Past Medical History  Diagnosis Date  . HYPERLIPIDEMIA-MIXED 07/15/2009  . Chronic pain syndrome 10/27/2009    of ankle, shoulders, low back.  sciatica.   [name] HYPERTENSION 06/24/2009  . CORONARY ARTERY DISEASE 06/24/2009    a. s/p multiple PCIs - In 2008 he had a Taxus DES to the mild LAD, Endeavor DES to mid LCX and distal LCX. In January 2009 he had DES to distal LCX, mid LCX and proximal LCX. In November 2009 had BMS x 2 to the mid RCA. Cath 10/2011 with patent stents, noncardiac CP. LHC 01/2013: patent stents (noncardiac CP).  [name] URETHRAL STRICTURE 06/24/2009    self catheterizes.   [name] NEPHROLITHIASIS, HX OF 06/24/2009  . ERECTILE DYSFUNCTION, ORGANIC 05/30/2010  . Obesity   . ALLERGIC RHINITIS 06/24/2009  . Depression with anxiety     Prior suicide attempt  . Irregular heart beat   . Pneumonia   . Non-cardiac chest pain 10/2011, 01/2013  . Type II diabetes mellitus ([place]) 2012    no meds in 09/2014.   [name] [place] DISEASE, LUMBAR 04/19/2010  . Arthritis     "all my joints" (09/30/2013)  . Fibromyalgia   . Myocardial infarction ([place]) 2008  . Heart murmur   . OSA (obstructive sleep apnea)     not using  CPAP (09/30/2013)  . Depression   . History of kidney stones    Past Surgical History  Procedure Laterality Date  . Shoulder open rotator cuff repair Right X 2  . Total knee arthroplasty Bilateral 2008  . Carpal tunnel release Bilateral   . Urethral dilation  X 4  . Knee arthroscopy Right X 7  . Umbilical hernia repair      UHR  . Hernia repair      umbilical  . Shoulder arthroscopy w/ rotator cuff repair Bilateral     "3on the left; 2 on the right"  . Tonsillectomy    . Knee cartilage surgery Right X 4    "open; not scopes"  . Knee cartilage surgery Left X 3    "open; not scopes"  . Coronary angioplasty with stent placement      "I have 9 stents"  . Cardiac catheterization  X 1  . Anterior cervical decomp/discectomy fusion N/A 09/26/2013    Procedure: ANTERIOR CERVICAL DISCECTOMY FUSION C3-4, plate and screw fixation, allograft bone graft;  Surgeon: Jessy Oto, MD;  Location: [facility];  Service: Orthopedics;  Laterality: N/A;  . Laminectomy  01/28/2014    L3 L4 L5  . Lumbar laminectomy/decompression microdiscectomy N/A 01/27/2014    Procedure: CENTRAL LUMBAR LAMINECTOMY L4-5 AND L3-4;  Surgeon:  Jessy Oto, MD;  Location: [facility];  Service: Orthopedics;  Laterality: N/A;  . Joint replacement Bilateral     knees  . Colonoscopy    . Vasectomy    . Fusion of talonavicular joint Right 04/15/2014    dr duda  . Ankle fusion Right 04/15/2014    Procedure: Right Subtalar, Talonavicular Fusion;  Surgeon: Newt Minion, MD;  Location: [facility];  Service: Orthopedics;  Laterality: Right;  . Left heart catheterization with coronary angiogram N/A 02/10/2013    Procedure: LEFT HEART CATHETERIZATION WITH CORONARY ANGIOGRAM;  Surgeon: Burnell Blanks, MD;  Location: [facility] CATH LAB;  Service: Cardiovascular;  Laterality: N/A;  . Esophagogastroduodenoscopy N/A 10/19/2014    Procedure: ESOPHAGOGASTRODUODENOSCOPY (EGD);  Surgeon: Jerene Bears, MD;  Location[facility] ENDOSCOPY;  Service: Endoscopy;   Laterality: N/A;  . Colonoscopy N/A 10/22/2014    Procedure: COLONOSCOPY;  Surgeon: Lafayette Dragon, MD;  Location: [facility] ENDOSCOPY;  Service: Endoscopy;  Laterality: N/A;  . Inguinal hernia repair  05/11/2015  . Inguinal hernia repair Right 05/11/2015    Procedure: LAPAROSCOPIC REPAIR RIGHT  INGUINAL HERNIA;  Surgeon: Greer Pickerel, MD;  Location: [facility];  Service: General;  Laterality: Right;  . Insertion of mesh Right 05/11/2015    Procedure: INSERTION OF MESH;  Surgeon: Greer Pickerel, MD;  Location: [facility];  Service: General;  Laterality: Right;   Family History  Problem Relation Age of Onset  . Depression Mother   . Heart disease Mother   . Hypertension Mother   . Cancer Mother     Breast  . Diabetes Father   . Heart disease Father     CABG  . Cancer Father     prostate and skin cancer  . Hypertension Father   . Hyperlipidemia Father   . Depression Brother     x 2  . Hypertension Brother     x2  . Coronary artery disease Other   . Hypertension Other   . Depression Other   . Heart disease Maternal Grandfather   . Early death Maternal Grandfather 95    heart attack  . Early death Paternal Grandfather    Social History  Substance Use Topics  . Smoking status: Former Smoker    Types: Cigars    Quit date: 08/28/2010  . Smokeless tobacco: Never Used     Comment: 09/30/2013 "smoked 1 cigar/wk when I did smoke"  . Alcohol Use: No     Comment: rare (4 beers all summer)  None at all now (03/2014)    Review of Systems  Genitourinary: Positive for scrotal swelling.  All other systems reviewed and are negative.     Allergies  Review of patient's allergies indicates no known allergies.  Home Medications   Prior to Admission medications   Medication Sig Start Date End Date Taking? Authorizing Provider  ARIPiprazole (ABILIFY) 5 MG tablet Take 1 tablet (5 mg total) by mouth at bedtime. 03/10/15   Kathlee Nations, MD  aspirin 81 MG tablet Take 1 tablet (81 mg total) by mouth daily.  Resume in 1 week after FU with PCP 10/22/14   Domenic Polite, MD  atorvastatin (LIPITOR) 20 MG tablet TAKE 1 TABLET BY MOUTH EVERY DAY 04/23/15   [name], MD  clonazePAM (KLONOPIN) 1 MG tablet TAKE 1 TABLET BY MOUTH AT BEDTIME AS NEEDED FOR ANXIETY 03/17/15   [name], MD  cyclobenzaprine (FLEXERIL) 10 MG tablet Take 1 tablet by mouth 2 (two) times daily  as needed. 04/09/15   Historical Provider, MD  FLUoxetine (PROZAC) 40 MG capsule Take 1 capsule (40 mg total) by mouth daily. 03/10/15   Kathlee Nations, MD  fluticasone (FLONASE) 50 MCG/ACT nasal spray Place 1 spray into both nostrils daily as needed (congestion).     Historical Provider, MD  gabapentin (NEURONTIN) 800 MG tablet Take 1 tablet by mouth 4 (four) times daily. 04/23/15   Historical Provider, MD  metFORMIN (GLUCOPHAGE) 500 MG tablet Take 1 tablet (500 mg total) by mouth 2 (two) times daily with a meal. 03/11/15   [name], MD  Omega-3 Fatty Acids (FISH OIL) 1000 MG CAPS Take 1,000 mg by mouth daily.     Historical Provider, MD  oxyCODONE (OXY IR/ROXICODONE) 5 MG immediate release tablet Take 1-2 tablets (5-10 mg total) by mouth every 4 (four) hours as needed for moderate pain, severe pain or breakthrough pain. 05/12/15   Greer Pickerel, MD  oxycodone (ROXICODONE) 30 MG immediate release tablet Take 30 mg by mouth 5 (five) times daily as needed for pain.  09/17/14   Historical Provider, MD  oxymorphone (OPANA) 10 MG tablet TAKE 1 TAB TWICE DAILY AS NEEDED FOR BREAKTHROUGH PAIN  (PAIN FLARE) 03/03/15   Historical Provider, MD  pantoprazole (PROTONIX) 40 MG tablet Take 1 tablet (40 mg total) by mouth daily. 01/11/15   Larey Dresser, MD  prasugrel (EFFIENT) 10 MG TABS tablet Take 10 mg by mouth daily.    Historical Provider, MD  sildenafil (VIAGRA) 50 MG tablet Take 1 tablet (50 mg total) by mouth daily as needed for erectile dysfunction. 09/03/13   Theodis Blaze, MD  tamsulosin [facility]) 0.4 MG CAPS capsule Take 0.4 mg by mouth daily after  breakfast. 10/15/13   [name], MD  traZODone (DESYREL) 100 MG tablet Take 1 tablet (100 mg total) by mouth at bedtime. 03/10/15   Kathlee Nations, MD   BP 114/62 mmHg  Pulse 76  Temp(Src) 98.9 F (37.2 C) (Oral)  Resp 22  SpO2 98% Physical Exam  Constitutional:  Uncomfortable   HENT:  Head: Normocephalic.  Eyes: Conjunctivae are normal. Pupils are equal, round, and reactive to light.  Cardiovascular: Normal rate, regular rhythm and normal heart sounds.   Pulmonary/Chest: Effort normal and breath sounds normal. No respiratory distress. He has no wheezes. He has no rales.  Abdominal: Soft. Bowel sounds are normal. He exhibits no distension. There is no tenderness. There is no rebound.  Surgical site healing well.   Genitourinary:  R scrotum swollen with possible seroma vs hematoma. Unable to palpate R testicle. L testicle nontender   Musculoskeletal: Normal range of motion.  Neurological: He is alert.  Skin: Skin is warm and dry.  Psychiatric: He has a normal mood and affect. His behavior is normal. Judgment and thought content normal.  Nursing note and vitals reviewed.   ED Course  Procedures (including critical care time) Labs Review Labs Reviewed  CBC WITH DIFFERENTIAL/PLATELET - Abnormal; Notable for the following:    RBC 3.51 (*)    Hemoglobin 10.7 (*)    HCT 31.4 (*)    All other components within normal limits  BASIC METABOLIC PANEL - Abnormal; Notable for the following:    Potassium 5.5 (*)    Glucose, Bld 121 (*)    BUN 24 (*)    All other components within normal limits  I-STAT CHEM 8, ED - Abnormal; Notable for the following:    Chloride 99 (*)  BUN 24 (*)    Hemoglobin 10.2 (*)    HCT 30.0 (*)    All other components within normal limits  URINALYSIS, ROUTINE W REFLEX MICROSCOPIC (NOT AT [facility])    Imaging Review US Scrotum  05/16/2015  CLINICAL DATA:  Right hernia repair 2 weeks ago. Severe scrotal edema. EXAM: SCROTAL ULTRASOUND DOPPLER ULTRASOUND OF  THE TESTICLES TECHNIQUE: Complete ultrasound examination of the testicles, epididymis, and other scrotal structures was performed. Color and spectral Doppler ultrasound were also utilized to evaluate blood flow to the testicles. COMPARISON:  None. FINDINGS: Right testicle Measurements: 4.6 x 2.6 x 2.7 cm. No mass or microlithiasis visualized. Left testicle Measurements: 4.2 x 2.1 x 3.2 cm. No mass or microlithiasis visualized. Right epididymis: Diffuse enlargement with heterogeneous echotexture predominately of the epididymal body and tail, nonspecific. Left epididymis:  Normal in size and appearance. Hydrocele:  Small right hydrocele noted. Varicocele:  None visualized. Severe scrotal edema evident. Pulsed Doppler interrogation of both testes demonstrates normal low resistance arterial and venous waveforms bilaterally. IMPRESSION: Severe scrotal edema, slightly worse on the right with associated enlargement of the right epididymis and a small right hydrocele. This may represent dependent edema from recent right hernia repair. Difficult to exclude right epididymitis. No acute testicular abnormality or torsion evident. Electronically Signed   By: Jerilynn Mages.  Shick M.D.   On: 05/16/2015 17:02   [place] Art/ven Flow Abd Pelv Doppler  05/16/2015  CLINICAL DATA:  Right hernia repair 2 weeks ago. Severe scrotal edema. EXAM: SCROTAL ULTRASOUND DOPPLER ULTRASOUND OF THE TESTICLES TECHNIQUE: Complete ultrasound examination of the testicles, epididymis, and other scrotal structures was performed. Color and spectral Doppler ultrasound were also utilized to evaluate blood flow to the testicles. COMPARISON:  None. FINDINGS: Right testicle Measurements: 4.6 x 2.6 x 2.7 cm. No mass or microlithiasis visualized. Left testicle Measurements: 4.2 x 2.1 x 3.2 cm. No mass or microlithiasis visualized. Right epididymis: Diffuse enlargement with heterogeneous echotexture predominately of the epididymal body and tail, nonspecific. Left epididymis:   Normal in size and appearance. Hydrocele:  Small right hydrocele noted. Varicocele:  None visualized. Severe scrotal edema evident. Pulsed Doppler interrogation of both testes demonstrates normal low resistance arterial and venous waveforms bilaterally. IMPRESSION: Severe scrotal edema, slightly worse on the right with associated enlargement of the right epididymis and a small right hydrocele. This may represent dependent edema from recent right hernia repair. Difficult to exclude right epididymitis. No acute testicular abnormality or torsion evident. Electronically Signed   By: Jerilynn Mages.  Shick M.D.   On: 05/16/2015 17:02   I have personally reviewed and evaluated these images and lab results as part of my medical decision-making.   EKG Interpretation None      MDM   Final diagnoses:  Scrotal swelling    Travis Kauth Sr. is a 64 y.o. male here with R scrotal pain and swelling. Consider post op swelling vs hematoma vs recurrent hernia. Will get labs, US scrotum.   7:51 PM US showed scrotal edema on the right with hydrocele. Likely dependent edema from hernia repair, possible epididymitis. Will give levaquin empirically. Pain controlled with morphine. Has percocet at home. He is also constipated and has been straining with BM. Recommend stool softeners, pain meds, ice, elevation, surgery f/u.     Wandra Arthurs, MD 05/16/15 [phone]

## 2015-05-16 NOTE — Discharge Instructions (Signed)
Take your percocet as prescribed with your doctor.   Stay hydrated.   Take miralax daily for constipation.   Try to apply ice and try to put a pillow under your scrotum.   See your surgeon.   Try not to strain when you have a bowel movement.   Return to ER if you have severe pain, worse swelling, unable to urinate, fevers.

## 2015-05-16 NOTE — ED Notes (Signed)
Pt reports to the ED for eval of throbbing scrotal pain and swelling. Pt had hernia surgery by Dr. Redmond Pulling on Tuesday and his wound dehisced and he had to have sutures replaced on Wednesday. Pt reports the pain onset was gradual. Pt able to urinate without difficulty. Pt A&Ox4, resp e/u, and skin warm and dry.

## 2015-06-01 ENCOUNTER — Telehealth: Payer: Self-pay | Admitting: *Deleted

## 2015-06-01 NOTE — Telephone Encounter (Signed)
Company left msg on triage wanting verbal order to ok testing supplies. [place] back did not get name, no diabetic supplies on pt med list. LMOM RTC ...Travis [name]

## 2015-06-02 MED ORDER — TRUE METRIX METER W/DEVICE KIT: PACK | Status: DC

## 2015-06-02 MED ORDER — GLUCOSE BLOOD VI STRP: 1.0000 | ORAL_STRIP | Freq: Two times a day (BID) | Status: DC

## 2015-06-02 NOTE — Telephone Encounter (Signed)
Called company back it was [facility] needing ok for True Met[employer] Monitor w/supplies. Gave verbal order to refill...Johny Chess

## 2015-06-07 ENCOUNTER — Other Ambulatory Visit (HOSPITAL_COMMUNITY): Payer: Self-pay | Admitting: Psychiatry

## 2015-06-10 ENCOUNTER — Encounter (HOSPITAL_COMMUNITY): Payer: Self-pay | Admitting: Psychiatry

## 2015-06-10 ENCOUNTER — Ambulatory Visit (INDEPENDENT_AMBULATORY_CARE_PROVIDER_SITE_OTHER): Payer: Medicare PPO | Admitting: Psychiatry

## 2015-06-10 VITALS — BP 132/72 | HR 60 | Ht 73.0 in | Wt 291.4 lb

## 2015-06-10 DIAGNOSIS — F319 Bipolar disorder, unspecified: Secondary | ICD-10-CM

## 2015-06-10 MED ORDER — FLUOXETINE HCL 40 MG PO CAPS: 40.0000 mg | ORAL_CAPSULE | Freq: Every day | ORAL | Status: DC

## 2015-06-10 MED ORDER — ARIPIPRAZOLE 5 MG PO TABS: 5.0000 mg | ORAL_TABLET | Freq: Every day | ORAL | Status: DC

## 2015-06-10 MED ORDER — TRAZODONE HCL 100 MG PO TABS: 100.0000 mg | ORAL_TABLET | Freq: Every day | ORAL | Status: DC

## 2015-06-10 NOTE — Progress Notes (Signed)
Travis [name] [phone] Progress Note  Travis [name] [id number] 65 y.o.  06/10/2015 11:34 AM  Chief Complaint:  Medication management and follow-up.         History of Present Illness:  Travis [name] came for his followup appointment.   He has recently surgery for hernia repair and after that he has been in his testicles and visited emergency room.  He is doing better now.  He is taking his medication as prescribed.  Some nights he had insomnia but overall he described his mood is much better.  He is taking his medication and reported no side effects.  On his last visit we increased trazodone which helps his sleep.  He is taking Abilify and Prozac.  He is also taking multiple medication from his other providers.  Patient denies any mania, psychosis, hallucination or any crying spells.  He denies any feeling of hopelessness or worthlessness.  He had a very good Christmas.  He has not seen Dr. Cheryln Manly in a while because he does not feel he needed any counseling.  Patient denies drinking or using any illegal substances.  He denies any tremors or shakes or any EPS.  His appetite is okay.  His vitals are stable.  Suicidal Ideation: No Plan Formed: No Patient has means to carry out plan: No  Homicidal Ideation: No Plan Formed: No Patient has means to carry out plan: No  Past Psychiatric History/Hospitalization(s) Patient has history of bipolar disorder and he is seeing psychiatrists since age 34.  He saw psychiatrist in [place] and [place].  He has history of severe mood swings , anger issues, hallucination, irritability and poor impulse control and excessive spending. He married 5 times and he has 2 children but patient has no contact with them.He has one psychiatric hospitalization in 2014 when he took overdose on Xanax with alcohol.  He had at least history of 2 suicidal gesture in the past.  Patient also attended intensive outpatient program in the past.  He had tried Effexor, Xanax,  Klonopin, Ambien , Lunesta, Wellbutrin, Ambien and Remeron.  He was started on Abilify from this office.  Anxiety: Yes Bipolar Disorder: Yes Depression: Yes Mania: Yes Psychosis: Yes Schizophrenia: No Personality Disorder: No Hospitalization for psychiatric illness: Yes History of Electroconvulsive Shock Therapy: No Prior Suicide Attempts: Yes  Medical History; Patient has hypertension, coronary artery disease, allergic rhinitis, type 2 diabetes mellitus, left leg pain, degenerative joint disease, chronic back pain, urethral stricture, and about his function, hyperlipidemia, nephrolithiasis and chronic fatigue.  His primary care physician is Dr. Kandee Keen.  Review of Systems  Constitutional: Negative.  Negative for weight loss.  Cardiovascular: Negative for chest pain and palpitations.  Gastrointestinal: Negative for nausea and vomiting.  Musculoskeletal: Positive for back pain, joint pain and neck pain.  Skin: Negative for itching and rash.  Neurological: Negative for dizziness, tremors and headaches.  Psychiatric/Behavioral: Negative for suicidal ideas and substance abuse. The patient has insomnia.     Psychiatric: Agitation: No Hallucination: No Depressed Mood: No Insomnia: No Hypersomnia: No Altered Concentration: No Feels Worthless: No Grandiose Ideas: No Belief In Special Powers: No New/Increased Substance Abuse: No Compulsions: No  Neurologic: Headache: No Seizure: No Paresthesias: Yes    Outpatient Encounter Prescriptions as of 06/10/2015  Medication Sig  . ARIPiprazole (ABILIFY) 5 MG tablet Take 1 tablet (5 mg total) by mouth at bedtime.  [name] aspirin 81 MG tablet Take 1 tablet (81 mg total) by mouth daily. Resume in 1 week after FU  with PCP  . atorvastatin (LIPITOR) 20 MG tablet TAKE 1 TABLET BY MOUTH EVERY DAY  . Blood Glucose Monitoring Suppl (TRUE METRIX METER) w/Device KIT Use as directed  . clonazePAM (KLONOPIN) 1 MG tablet TAKE 1 TABLET BY MOUTH AT  BEDTIME AS NEEDED FOR ANXIETY  . cyclobenzaprine (FLEXERIL) 10 MG tablet Take 1 tablet by mouth 2 (two) times daily as needed for muscle spasms.   [name] FLUoxetine (PROZAC) 40 MG capsule Take 1 capsule (40 mg total) by mouth daily.  . fluticasone (FLONASE) 50 MCG/ACT nasal spray Place 1 spray into both nostrils daily as needed (congestion).   [name] glucose blood (TRUE METRIX BLOOD GLUCOSE TEST) test strip 1 each by Other route 2 (two) times daily. Use to check blood sugars twice a day Dx E11.9  . LYRICA 75 MG capsule Take 75 mg by mouth 2 (two) times daily.  . metFORMIN (GLUCOPHAGE) 500 MG tablet Take 1 tablet (500 mg total) by mouth 2 (two) times daily with a meal.  . Omega-3 Fatty Acids (FISH OIL) 1000 MG CAPS Take 1,000 mg by mouth daily.   [name] oxyCODONE (OXY IR/ROXICODONE) 5 MG immediate release tablet Take 1-2 tablets (5-10 mg total) by mouth every 4 (four) hours as needed for moderate pain, severe pain or breakthrough pain.  [name] oxymorphone (OPANA) 10 MG tablet TAKE 1 TAB TWICE DAILY AS NEEDED FOR BREAKTHROUGH PAIN  (PAIN FLARE)  . pantoprazole (PROTONIX) 40 MG tablet Take 1 tablet (40 mg total) by mouth daily.  . polyethylene glycol (MIRALAX / GLYCOLAX) packet Take 17 g by mouth daily.  . prasugrel (EFFIENT) 10 MG TABS tablet Take 10 mg by mouth daily.  . sildenafil (VIAGRA) 50 MG tablet Take 1 tablet (50 mg total) by mouth daily as needed for erectile dysfunction.  . tamsulosin (FLOMAX) 0.4 MG CAPS capsule Take 0.4 mg by mouth daily after breakfast.  . traZODone (DESYREL) 100 MG tablet Take 1 tablet (100 mg total) by mouth at bedtime.  . [DISCONTINUED] ARIPiprazole (ABILIFY) 5 MG tablet Take 1 tablet (5 mg total) by mouth at bedtime.  . [DISCONTINUED] FLUoxetine (PROZAC) 40 MG capsule Take 1 capsule (40 mg total) by mouth daily.  . [DISCONTINUED] gabapentin (NEURONTIN) 800 MG tablet Take 1 tablet by mouth 4 (four) times daily.  . [DISCONTINUED] levofloxacin (LEVAQUIN) 500 MG tablet Take 1 tablet (500  mg total) by mouth daily.  . [DISCONTINUED] traZODone (DESYREL) 100 MG tablet Take 1 tablet (100 mg total) by mouth at bedtime.   No facility-administered encounter medications on file as of 06/10/2015.    Physical Exam: Constitutional:  BP 132/72 mmHg  Pulse 60  Ht 6' 1" (1.854 m)  Wt 291 lb 6.4 oz (132.178 kg)  BMI 38.45 kg/m2 Recent Results (from the past 2160 hour(s))  Microalbumin / creatinine urine ratio     Status: None   Collection Time: 03/12/15 12:40 PM  Result Value Ref Range   Microalb, Ur <0.7 0.0 - 1.9 mg/dL   Creatinine,U 137.6 mg/dL   Microalb Creat Ratio 0.5 0.0 - 30.0 mg/g  Hemoglobin A1c     Status: None   Collection Time: 03/12/15 12:40 PM  Result Value Ref Range   Hgb A1c MFr Bld 5.4 4.6 - 6.5 %    Comment: Glycemic Control Guidelines for People with Diabetes:Non Diabetic:  <6%Goal of Therapy: <7%Additional Action Suggested:  >8%   Lipid panel     Status: None   Collection Time: 03/12/15 12:40 PM  Result Value Ref Range  Cholesterol 120 0 - 200 mg/dL    Comment: ATP III Classification       Desirable:  < 200 mg/dL               Borderline High:  200 - 239 mg/dL          High:  > = 240 mg/dL   Triglycerides 83.0 0.0 - 149.0 mg/dL    Comment: Normal:  <150 mg/dLBorderline High:  150 - 199 mg/dL   HDL 52.70 >39.00 mg/dL   VLDL 16.6 0.0 - 40.0 mg/dL   LDL Cholesterol 51 0 - 99 mg/dL   Total CHOL/HDL Ratio 2     Comment:                Men          Women1/2 Average Risk     3.4          3.3Average Risk          5.0          4.42X Average Risk          9.6          7.13X Average Risk          15.0          11.0                       NonHDL 67.79     Comment: NOTE:  Non-HDL goal should be 30 mg/dL higher than patient's LDL goal (i.e. LDL goal of < 70 mg/dL, would have non-HDL goal of < 100 mg/dL)  Basic metabolic panel     Status: None   Collection Time: 03/12/15 12:40 PM  Result Value Ref Range   Sodium 139 135 - 145 mEq/L   Potassium 4.3 3.5 - 5.1 mEq/L    Chloride 104 96 - 112 mEq/L   CO2 27 19 - 32 mEq/L   Glucose, Bld 98 70 - 99 mg/dL   BUN 21 6 - 23 mg/dL   Creatinine, Ser 0.92 0.40 - 1.50 mg/dL   Calcium 9.1 8.4 - 10.5 mg/dL   GFR 88.38 >60.00 mL/min  Hepatic function panel     Status: None   Collection Time: 03/12/15 12:40 PM  Result Value Ref Range   Total Bilirubin 0.5 0.2 - 1.2 mg/dL   Bilirubin, Direct 0.1 0.0 - 0.3 mg/dL   Alkaline Phosphatase 72 39 - 117 U/L   AST 18 0 - 37 U/L   ALT 12 0 - 53 U/L   Total Protein 6.4 6.0 - 8.3 g/dL   Albumin 4.0 3.5 - 5.2 g/dL  CBC with Differential/Platelet     Status: Abnormal   Collection Time: 03/12/15 12:40 PM  Result Value Ref Range   WBC 4.6 4.0 - 10.5 K/uL   RBC 4.08 (L) 4.22 - 5.81 Mil/uL   Hemoglobin 12.3 (L) 13.0 - 17.0 g/dL   HCT 36.7 (L) 39.0 - 52.0 %   MCV 90.0 78.0 - 100.0 fl   MCHC 33.5 30.0 - 36.0 g/dL   RDW 13.7 11.5 - 15.5 %   Platelets 213.0 150.0 - 400.0 K/uL   Neutrophils Relative % 62.5 43.0 - 77.0 %   Lymphocytes Relative 24.2 12.0 - 46.0 %   Monocytes Relative 10.3 3.0 - 12.0 %   Eosinophils Relative 2.4 0.0 - 5.0 %   Basophils Relative 0.6 0.0 - 3.0 %   Neutro Abs 2.9 1.4 -  7.7 K/uL   Lymphs Abs 1.1 0.7 - 4.0 K/uL   Monocytes Absolute 0.5 0.1 - 1.0 K/uL   Eosinophils Absolute 0.1 0.0 - 0.7 K/uL   Basophils Absolute 0.0 0.0 - 0.1 K/uL  TSH     Status: None   Collection Time: 03/12/15 12:40 PM  Result Value Ref Range   TSH 1.80 0.35 - 4.50 uIU/mL  Urinalysis, Routine w reflex microscopic (not at [facility])     Status: Abnormal   Collection Time: 03/12/15 12:40 PM  Result Value Ref Range   Color, Urine YELLOW Yellow;Lt. Yellow   APPearance CLEAR Clear   Specific Gravity, Urine 1.025 1.000-1.030   pH 5.5 5.0 - 8.0   Total Protein, Urine NEGATIVE Negative   Urine Glucose NEGATIVE Negative   Ketones, ur NEGATIVE Negative   Bilirubin Urine NEGATIVE Negative   Hgb urine dipstick TRACE-INTACT (A) Negative   Urobilinogen, UA 0.2 0.0 - 1.0   Leukocytes, UA  NEGATIVE Negative   Nitrite NEGATIVE Negative   WBC, UA 3-6/hpf (A) 0-2/hpf   RBC / HPF none seen 0-2/hpf   Squamous Epithelial / LPF Rare(0-4/hpf) Rare(0-4/hpf)   Bacteria, UA Rare(<10/hpf) (A) None  PSA     Status: None   Collection Time: 03/12/15 12:40 PM  Result Value Ref Range   PSA 0.55 0.10 - 4.00 ng/mL  TB Skin Test     Status: Abnormal   Collection Time: 04/05/15  2:42 PM  Result Value Ref Range   TB Skin Test Positive    Induration 5 mm  Basic metabolic panel     Status: None   Collection Time: 04/08/15 12:40 PM  Result Value Ref Range   Sodium 139 135 - 145 mEq/L   Potassium 4.5 3.5 - 5.1 mEq/L   Chloride 103 96 - 112 mEq/L   CO2 29 19 - 32 mEq/L   Glucose, Bld 93 70 - 99 mg/dL   BUN 18 6 - 23 mg/dL   Creatinine, Ser 0.94 0.40 - 1.50 mg/dL   Calcium 9.1 8.4 - 10.5 mg/dL   GFR 86.19 >60.00 mL/min  Hepatic function panel     Status: None   Collection Time: 04/08/15 12:40 PM  Result Value Ref Range   Total Bilirubin 0.3 0.2 - 1.2 mg/dL   Bilirubin, Direct 0.1 0.0 - 0.3 mg/dL   Alkaline Phosphatase 72 39 - 117 U/L   AST 15 0 - 37 U/L   ALT 16 0 - 53 U/L   Total Protein 7.4 6.0 - 8.3 g/dL   Albumin 4.0 3.5 - 5.2 g/dL  CBC with Differential/Platelet     Status: Abnormal   Collection Time: 04/08/15 12:40 PM  Result Value Ref Range   WBC 6.9 4.0 - 10.5 K/uL   RBC 4.19 (L) 4.22 - 5.81 Mil/uL   Hemoglobin 12.5 (L) 13.0 - 17.0 g/dL   HCT 37.3 (L) 39.0 - 52.0 %   MCV 89.0 78.0 - 100.0 fl   MCHC 33.5 30.0 - 36.0 g/dL   RDW 14.4 11.5 - 15.5 %   Platelets 381.0 150.0 - 400.0 K/uL   Neutrophils Relative % 60.0 43.0 - 77.0 %   Lymphocytes Relative 26.0 12.0 - 46.0 %   Monocytes Relative 10.4 3.0 - 12.0 %   Eosinophils Relative 2.9 0.0 - 5.0 %   Basophils Relative 0.7 0.0 - 3.0 %   Neutro Abs 4.1 1.4 - 7.7 K/uL   Lymphs Abs 1.8 0.7 - 4.0 K/uL   Monocytes Absolute 0.7 0.1 -  1.0 K/uL   Eosinophils Absolute 0.2 0.0 - 0.7 K/uL   Basophils Absolute 0.0 0.0 - 0.1 K/uL   Urinalysis, Routine w reflex microscopic (not at [facility])     Status: Abnormal   Collection Time: 04/08/15 12:40 PM  Result Value Ref Range   Color, Urine YELLOW Yellow;Lt. Yellow   APPearance CLEAR Clear   Specific Gravity, Urine 1.025 1.000-1.030   pH 6.0 5.0 - 8.0   Total Protein, Urine NEGATIVE Negative   Urine Glucose NEGATIVE Negative   Ketones, ur NEGATIVE Negative   Bilirubin Urine NEGATIVE Negative   Hgb urine dipstick NEGATIVE Negative   Urobilinogen, UA 0.2 0.0 - 1.0   Leukocytes, UA NEGATIVE Negative   Nitrite NEGATIVE Negative   WBC, UA 3-6/hpf (A) 0-2/hpf   RBC / HPF none seen 0-2/hpf   Squamous Epithelial / LPF Rare(0-4/hpf) Rare(0-4/hpf)  Urine culture     Status: None   Collection Time: 04/08/15 12:40 PM  Result Value Ref Range   Culture STAPHYLOCOCCUS SPECIES (COAGULASE NEGATIVE)    Colony Count 30,000 COLONIES/ML    Organism ID, Bacteria STAPHYLOCOCCUS SPECIES (COAGULASE NEGATIVE)     Comment: Rifampin and Gentamicin should not be used as single drugs for treatment of Staph infections.       Susceptibility   Staphylococcus species (coagulase negative) -  (no method available)    OXACILLIN  Sensitive     CEFAZOLIN  Sensitive     GENTAMICIN <=0.5 Sensitive     CIPROFLOXACIN <=0.5 Sensitive     LEVOFLOXACIN 0.5 Sensitive     NITROFURANTOIN <=16 Sensitive     TRIMETH/SULFA <=10 Sensitive     VANCOMYCIN <=0.5 Sensitive     RIFAMPIN <=0.5 Sensitive     TETRACYCLINE <=1 Sensitive   Lipase     Status: Abnormal   Collection Time: 04/08/15 12:40 PM  Result Value Ref Range   Lipase 9.0 (L) 11.0 - 59.0 U/L  Glucose, capillary     Status: Abnormal   Collection Time: 05/10/15  1:06 PM  Result Value Ref Range   Glucose-Capillary 138 (H) 65 - 99 mg/dL  CBC WITH DIFFERENTIAL     Status: Abnormal   Collection Time: 05/10/15  1:27 PM  Result Value Ref Range   WBC 4.0 4.0 - 10.5 K/uL   RBC 3.95 (L) 4.22 - 5.81 MIL/uL   Hemoglobin 11.7 (L) 13.0 - 17.0 g/dL   HCT 35.1  (L) 39.0 - 52.0 %   MCV 88.9 78.0 - 100.0 fL   MCH 29.6 26.0 - 34.0 pg   MCHC 33.3 30.0 - 36.0 g/dL   RDW 13.1 11.5 - 15.5 %   Platelets 200 150 - 400 K/uL   Neutrophils Relative % 54 %   Neutro Abs 2.2 1.7 - 7.7 K/uL   Lymphocytes Relative 29 %   Lymphs Abs 1.2 0.7 - 4.0 K/uL   Monocytes Relative 11 %   Monocytes Absolute 0.4 0.1 - 1.0 K/uL   Eosinophils Relative 5 %   Eosinophils Absolute 0.2 0.0 - 0.7 K/uL   Basophils Relative 1 %   Basophils Absolute 0.0 0.0 - 0.1 K/uL  Basic metabolic panel     Status: Abnormal   Collection Time: 05/10/15  1:27 PM  Result Value Ref Range   Sodium 140 135 - 145 mmol/L   Potassium 4.3 3.5 - 5.1 mmol/L   Chloride 105 101 - 111 mmol/L   CO2 25 22 - 32 mmol/L   Glucose, Bld 109 (H) 65 - 99  mg/dL   BUN 21 (H) 6 - 20 mg/dL   Creatinine, Ser 0.89 0.61 - 1.24 mg/dL   Calcium 9.0 8.9 - 10.3 mg/dL   GFR calc non Af Amer >60 >60 mL/min   GFR calc Af Amer >60 >60 mL/min    Comment: (NOTE) The eGFR has been calculated using the CKD EPI equation. This calculation has not been validated in all clinical situations. eGFR's persistently <60 mL/min signify possible Chronic Kidney Disease.    Anion gap 10 5 - 15  Glucose, capillary     Status: Abnormal   Collection Time: 05/11/15  7:46 AM  Result Value Ref Range   Glucose-Capillary 107 (H) 65 - 99 mg/dL  Glucose, capillary     Status: Abnormal   Collection Time: 05/11/15 11:57 AM  Result Value Ref Range   Glucose-Capillary 125 (H) 65 - 99 mg/dL  Glucose, capillary     Status: Abnormal   Collection Time: 05/11/15  6:36 PM  Result Value Ref Range   Glucose-Capillary 120 (H) 65 - 99 mg/dL  Glucose, capillary     Status: Abnormal   Collection Time: 05/11/15  9:02 PM  Result Value Ref Range   Glucose-Capillary 116 (H) 65 - 99 mg/dL   Comment 1 Notify RN   CBC     Status: Abnormal   Collection Time: 05/12/15  6:00 AM  Result Value Ref Range   WBC 4.6 4.0 - 10.5 K/uL   RBC 3.88 (L) 4.22 - 5.81  MIL/uL   Hemoglobin 11.7 (L) 13.0 - 17.0 g/dL   HCT 34.9 (L) 39.0 - 52.0 %   MCV 89.9 78.0 - 100.0 fL   MCH 30.2 26.0 - 34.0 pg   MCHC 33.5 30.0 - 36.0 g/dL   RDW 13.6 11.5 - 15.5 %   Platelets 204 150 - 400 K/uL  Glucose, capillary     Status: Abnormal   Collection Time: 05/12/15  7:45 AM  Result Value Ref Range   Glucose-Capillary 124 (H) 65 - 99 mg/dL   Comment 1 Repeat Test   CBC with Differential     Status: Abnormal   Collection Time: 05/16/15  3:34 PM  Result Value Ref Range   WBC 5.3 4.0 - 10.5 K/uL   RBC 3.51 (L) 4.22 - 5.81 MIL/uL   Hemoglobin 10.7 (L) 13.0 - 17.0 g/dL   HCT 31.4 (L) 39.0 - 52.0 %   MCV 89.5 78.0 - 100.0 fL   MCH 30.5 26.0 - 34.0 pg   MCHC 34.1 30.0 - 36.0 g/dL   RDW 13.1 11.5 - 15.5 %   Platelets 374 150 - 400 K/uL   Neutrophils Relative % 56 %   Neutro Abs 3.0 1.7 - 7.7 K/uL   Lymphocytes Relative 22 %   Lymphs Abs 1.2 0.7 - 4.0 K/uL   Monocytes Relative 16 %   Monocytes Absolute 0.8 0.1 - 1.0 K/uL   Eosinophils Relative 5 %   Eosinophils Absolute 0.2 0.0 - 0.7 K/uL   Basophils Relative 1 %   Basophils Absolute 0.0 0.0 - 0.1 K/uL  Basic metabolic panel     Status: Abnormal   Collection Time: 05/16/15  3:34 PM  Result Value Ref Range   Sodium 136 135 - 145 mmol/L   Potassium 5.5 (H) 3.5 - 5.1 mmol/L   Chloride 101 101 - 111 mmol/L   CO2 28 22 - 32 mmol/L   Glucose, Bld 121 (H) 65 - 99 mg/dL   BUN 24 (H) 6 -  20 mg/dL   Creatinine, Ser 1.02 0.61 - 1.24 mg/dL   Calcium 8.9 8.9 - 10.3 mg/dL   GFR calc non Af Amer >60 >60 mL/min   GFR calc Af Amer >60 >60 mL/min    Comment: (NOTE) The eGFR has been calculated using the CKD EPI equation. This calculation has not been validated in all clinical situations. eGFR's persistently <60 mL/min signify possible Chronic Kidney Disease.    Anion gap 7 5 - 15  I-stat chem 8, ed     Status: Abnormal   Collection Time: 05/16/15  6:41 PM  Result Value Ref Range   Sodium 140 135 - 145 mmol/L   Potassium  3.8 3.5 - 5.1 mmol/L   Chloride 99 (L) 101 - 111 mmol/L   BUN 24 (H) 6 - 20 mg/dL   Creatinine, Ser 0.90 0.61 - 1.24 mg/dL   Glucose, Bld 95 65 - 99 mg/dL   Calcium, Ion 1.13 1.13 - 1.30 mmol/L   TCO2 28 0 - 100 mmol/L   Hemoglobin 10.2 (L) 13.0 - 17.0 g/dL   HCT 30.0 (L) 39.0 - 52.0 %  Urinalysis, Routine w reflex microscopic (not at [facility])     Status: None   Collection Time: 05/16/15  7:15 PM  Result Value Ref Range   Color, Urine YELLOW YELLOW   APPearance CLEAR CLEAR   Specific Gravity, Urine 1.024 1.005 - 1.030   pH 5.5 5.0 - 8.0   Glucose, UA NEGATIVE NEGATIVE mg/dL   Hgb urine dipstick NEGATIVE NEGATIVE   Bilirubin Urine NEGATIVE NEGATIVE   Ketones, ur NEGATIVE NEGATIVE mg/dL   Protein, ur NEGATIVE NEGATIVE mg/dL   Nitrite NEGATIVE NEGATIVE   Leukocytes, UA NEGATIVE NEGATIVE    Comment: MICROSCOPIC NOT DONE ON URINES WITH NEGATIVE PROTEIN, BLOOD, LEUKOCYTES, NITRITE, OR GLUCOSE <1000 mg/dL.     Musculoskeletal: Strength & Muscle Tone: decreased Gait & Station: Difficulty walking due to pain Patient leans: Front  Mental Status Examination;  Patient is casually dressed and fairly groomed.  He maintained fair eye contact.  His speech is slow but clear and coherent.  He described his mood euthymic and his affect is appropriate.  He denies any suicidal thoughts or homicidal thoughts.  He denies any auditory hallucination or visual hallucination.  There were no paranoia , delusions or any of the session.  He denies any tremors or shakes.  His fund of knowledge is adequate.  He has difficulty walking because of chronic pain.  His fund of knowledge is average.  His attention and concentration is fair.  He is alert and oriented x3.  His insight judgment and impulse control is fair.  Established Problem, Stable/Improving (1), Review of Psycho-Social Stressors (1), Review or order clinical lab tests (1), Review of Last Therapy Session (1) and Review of Medication Regimen & Side Effects  (2)  Assessment: Axis I: Mood disorder NOS, rule out bipolar disorder depressed type  Axis II: Deferred  Axis III:  Past Medical History  Diagnosis Date  . HYPERLIPIDEMIA-MIXED 07/15/2009  . Chronic pain syndrome 10/27/2009    of ankle, shoulders, low back.  sciatica.   [name] HYPERTENSION 06/24/2009  . CORONARY ARTERY DISEASE 06/24/2009    a. s/p multiple PCIs - In 2008 he had a Taxus DES to the mild LAD, Endeavor DES to mid LCX and distal LCX. In January 2009 he had DES to distal LCX, mid LCX and proximal LCX. In November 2009 had BMS x 2 to the mid RCA. Cath 10/2011 with  patent stents, noncardiac CP. LHC 01/2013: patent stents (noncardiac CP).  [name] URETHRAL STRICTURE 06/24/2009    self catheterizes.   [name] NEPHROLITHIASIS, HX OF 06/24/2009  . ERECTILE DYSFUNCTION, ORGANIC 05/30/2010  . Obesity   . ALLERGIC RHINITIS 06/24/2009  . Depression with anxiety     Prior suicide attempt  . Irregular heart beat   . Pneumonia   . Non-cardiac chest pain 10/2011, 01/2013  . Type II diabetes mellitus ([place]) 2012    no meds in 09/2014.   [name] [place] DISEASE, LUMBAR 04/19/2010  . Arthritis     "all my joints" (09/30/2013)  . Fibromyalgia   . Myocardial infarction ([place]) 2008  . Heart murmur   . OSA (obstructive sleep apnea)     not using CPAP (09/30/2013)  . Depression   . History of kidney stones    Plan:  Patient doing better on his current psychiatric medication.  I reviewed his blood work which was done 3 weeks ago.  His chloride is 99, BUN 24, hemoglobin 10.2 and hematocrit 30.  His creatinine is normal.  He like to continue trazodone 100 mg at bedtime, Abilify 5 mg daily and Prozac 40 mg daily. Discussed medication side effects and benefits. Discussed polypharmacy and interaction with psychiatric medication.  His Lyrica, Klonopin is given by his primary care physician . Recommended to call us back if he has any question or any concern.  I will see him again in 3 months.   Vanden Fawaz T.,  MD 06/10/2015

## 2015-07-14 ENCOUNTER — Ambulatory Visit (INDEPENDENT_AMBULATORY_CARE_PROVIDER_SITE_OTHER): Payer: Medicare PPO | Admitting: Internal Medicine

## 2015-07-14 VITALS — BP 145/78 | HR 54 | Temp 98.3°F | Ht 73.0 in | Wt 298.0 lb

## 2015-07-14 DIAGNOSIS — R7611 Nonspecific reaction to tuberculin skin test without active tuberculosis: Secondary | ICD-10-CM

## 2015-07-14 NOTE — Progress Notes (Signed)
RFV; + ppd Subjective:    Patient ID: Travis Fortis Sr., male    DOB: [birth year], 65 y.o.   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 He previously was tested 3 years ago and was negative. In November 2016, ppd was measured at 82m. He does not have any risk factors for mTB. He reports that he has productive cough for the past 3 months ,but denies any fever, chills, nightsweats. cxr in November was negative for any infiltrate  No Known Allergies Current Outpatient Prescriptions on File Prior to Visit  Medication Sig Dispense Refill  . ARIPiprazole (ABILIFY) 5 MG tablet Take 1 tablet (5 mg total) by mouth at bedtime. 90 tablet 0  . aspirin 81 MG tablet Take 1 tablet (81 mg total) by mouth daily. Resume in 1 week after FU with PCP  0  . atorvastatin (LIPITOR) 20 MG tablet TAKE 1 TABLET BY MOUTH EVERY DAY 90 tablet 3  . Blood Glucose Monitoring Suppl (TRUE METRIX METER) w/Device KIT Use as directed 1 kit 0  . clonazePAM (KLONOPIN) 1 MG tablet TAKE 1 TABLET BY MOUTH AT BEDTIME AS NEEDED FOR ANXIETY 30 tablet 5  . cyclobenzaprine (FLEXERIL) 10 MG tablet Take 1 tablet by mouth 2 (two) times daily as needed for muscle spasms.   2  . FLUoxetine (PROZAC) 40 MG capsule Take 1 capsule (40 mg total) by mouth daily. 90 capsule 0  . fluticasone (FLONASE) 50 MCG/ACT nasal spray Place 1 spray into both nostrils daily as needed (congestion).     .Marland Kitchenglucose blood (TRUE METRIX BLOOD GLUCOSE TEST) test strip 1 each by Other route 2 (two) times daily. Use to check blood sugars twice a day Dx E11.9 300 each 3  . metFORMIN (GLUCOPHAGE) 500 MG tablet Take 1 tablet (500 mg total) by mouth 2 (two) times daily with a meal. 180 tablet 3  . Omega-3 Fatty Acids (FISH OIL) 1000 MG CAPS Take 1,000 mg by mouth daily.     . pantoprazole (PROTONIX) 40 MG tablet Take 1 tablet (40 mg total) by mouth daily. 90 tablet  1  . polyethylene glycol (MIRALAX / GLYCOLAX) packet Take 17 g by mouth daily. 14 each 0  . prasugrel (EFFIENT) 10 MG TABS tablet Take 10 mg by mouth daily.    . sildenafil (VIAGRA) 50 MG tablet Take 1 tablet (50 mg total) by mouth daily as needed for erectile dysfunction. 10 tablet 0  . tamsulosin (FLOMAX) 0.4 MG CAPS capsule Take 0.4 mg by mouth daily after breakfast.    . traZODone (DESYREL) 100 MG tablet Take 1 tablet (100 mg total) by mouth at bedtime. 90 tablet 0   No current facility-administered medications on file prior to visit.   Active Ambulatory Problems    Diagnosis Date Noted  . Hyperlipidemia 07/15/2009  . Depression 06/24/2009  . Chronic pain syndrome 10/27/2009  . Essential hypertension 06/24/2009  . CAD (coronary artery disease) 06/24/2009  . Allergic rhinitis 06/24/2009  . URETHRAL STRICTURE 06/24/2009  . DEGENERATIVE JOINT DISEASE 06/24/2009  . SHOULDER PAIN, BILATERAL 06/24/2009  . DWortonDISEASE, LUMBAR 04/19/2010  . SCIATICA, LEFT 04/19/2010  . FATIGUE 06/24/2009  . NEPHROLITHIASIS, HX OF 06/24/2009  . ERECTILE DYSFUNCTION, ORGANIC 05/30/2010  . Preventative health care 09/27/2010  . Obesity 01/12/2011  . Lumbar degenerative disc disease 07/11/2011  . Insomnia 10/04/2011  . Bradycardia 10/20/2011  . OSA (obstructive sleep  apnea) 11/07/2011  . Anxiety 11/12/2011  . Balance problems 12/25/2011  . Polypharmacy 06/25/2012  . Pre-ulcerative corn or callous 02/06/2013  . Balance disorder 03/12/2013  . Gait disorder 03/12/2013  . Tremor 03/12/2013  . Left hip pain 03/12/2013  . Low back pain 03/12/2013  . Pain of right thumb 04/03/2013  . Skin lesion of cheek 05/01/2013  . Hand joint pain 06/10/2013  . Rotator cuff tear arthropathy of right shoulder 06/10/2013  . Neck pain 09/02/2013  . Spinal stenosis in cervical region 09/26/2013  . Spinal stenosis, lumbar region, with neurogenic claudication 09/26/2013  . Cervical stenosis of spinal canal 09/26/2013    . Acute urinary retention 09/30/2013  . Uncontrolled type 2 DM with peripheral circulatory disorder ([place]) 10/04/2013  . Acute posthemorrhagic anemia 10/04/2013  . Cellulitis of leg, right 10/15/2013  . Headache(784.0) 10/15/2013  . Spinal stenosis, lumbar 01/27/2014  . Postoperative anemia due to acute blood loss 01/30/2014  . Hypotension 01/30/2014  . Arthritis of foot, right, degenerative 04/15/2014  . General weakness 07/14/2014  . Urinary incontinence 07/14/2014  . Orthostatic hypotension 10/12/2014  . Anemia 10/17/2014  . Near syncope 10/17/2014  . Closed fracture of right foot 10/17/2014  . Occult blood positive stool 10/17/2014  . Urethral stricture 10/17/2014  . AKI (acute kidney injury) ([place]) 10/18/2014  . Acute blood loss anemia   . Chronic anticoagulation   . Benign neoplasm of descending colon   . Benign neoplasm of cecum   . PPD positive 04/08/2015  . Cough 04/08/2015  . Abdominal pain 04/08/2015  . Right inguinal hernia 04/20/2015  . S/P laparoscopic hernia repair 05/11/2015   Resolved Ambulatory Problems    Diagnosis Date Noted  . OTITIS EXTERNA, LEFT 07/08/2009  . URI 06/24/2009  . OTHER&UNSPECIFIED DISEASES THE ORAL SOFT TISSUES 12/17/2009  . ANKLE PAIN, RIGHT 06/24/2009  . CHEST PAIN 06/24/2009  . CONTUSION, LEG 04/19/2010  . GLUCOSE INTOLERANCE 05/30/2010  . ACUT SUPPRATV OTITIS MEDIA W/SPONT RUP EARDRUM 06/09/2010  . Dysuria 05/30/2010  . Type II or unspecified type diabetes mellitus without mention of complication, uncontrolled 09/27/2010  . Dizziness 09/28/2010  . Nausea 09/28/2010  . Sinusitis acute 09/28/2010  . Vertigo 09/28/2010  . Nausea & vomiting 09/28/2010  . Dehydration 09/28/2010  . Acute upper respiratory infections of unspecified site 05/01/2013  . Neck pain, acute 09/02/2013  . Postprocedural fever 09/29/2013  . Acute bronchitis 09/30/2013  . Diabetes mellitus ([place]) 05/06/2014   Past Medical History  Diagnosis Date  .  HYPERLIPIDEMIA-MIXED 07/15/2009  . HYPERTENSION 06/24/2009  . CORONARY ARTERY DISEASE 06/24/2009  . ALLERGIC RHINITIS 06/24/2009  . Depression with anxiety   . Irregular heart beat   . Pneumonia   . Non-cardiac chest pain 10/2011, 01/2013  . Type II diabetes mellitus ([place]) 2012  . Arthritis   . Fibromyalgia   . Myocardial infarction ([place]) 2008  . Heart murmur   . History of kidney stones    Social History  Substance Use Topics  . Smoking status: Former Smoker    Types: Cigars    Quit date: 08/28/2010  . Smokeless tobacco: Never Used     Comment: 09/30/2013 "smoked 1 cigar/wk when I did smoke"  . Alcohol Use: No     Comment: rare (4 beers all summer)  None at all now (03/2014)  family history includes Cancer in his father and mother; Coronary artery disease in his other; Depression in his brother, mother, and other; Diabetes in his father; Early death in his  paternal grandfather; Early death (age of onset: 71) in his maternal grandfather; Heart disease in his father, maternal grandfather, and mother; Hyperlipidemia in his father; Hypertension in his brother, father, mother, and other.  Review of Systems Review of Systems  Constitutional: Negative for fever, chills, diaphoresis, activity change, appetite change, fatigue and unexpected weight change.  HENT: Negative for congestion, sore throat, rhinorrhea, sneezing, trouble swallowing and sinus pressure.  Eyes: Negative for photophobia and visual disturbance.  Respiratory: Negative for cough, chest tightness, shortness of breath, wheezing and stridor.  Cardiovascular: Negative for chest pain, palpitations and leg swelling.  Gastrointestinal: Negative for nausea, vomiting, abdominal pain, diarrhea, constipation, blood in stool, abdominal distention and anal bleeding.  Genitourinary: Negative for dysuria, hematuria, flank pain and difficulty urinating.  Musculoskeletal: Negative for myalgias, back pain, joint swelling, arthralgias and gait  problem.  Skin: Negative for color change, pallor, rash and wound.  Neurological: Negative for dizziness, tremors, weakness and light-headedness.  Hematological: Negative for adenopathy. Does not bruise/bleed easily.  Psychiatric/Behavioral: Negative for behavioral problems, confusion, sleep disturbance, dysphoric mood, decreased concentration and agitation.       Objective:   Physical Exam BP 145/78 mmHg  Pulse 54  Temp(Src) 98.3 F (36.8 C) (Oral)  Ht _0  (1.854 m)  Wt 298 lb (135.172 kg)  BMI 39.32 kg/m2 Physical Exam  Constitutional: He is oriented to person, place, and time. He appears well-developed and well-nourished. No distress.  HENT:  Mouth/Throat: Oropharynx is clear and moist. No oropharyngeal exudate.  Cardiovascular: Normal rate, regular rhythm and normal heart sounds. Exam reveals no gallop and no friction rub.  No murmur heard.  Pulmonary/Chest: Effort normal and breath sounds normal. No respiratory distress. He has no wheezes.  Abdominal: Soft. Bowel sounds are normal. He exhibits no distension. There is no tenderness.  Lymphadenopathy:  He has no cervical adenopathy.  Neurological: He is alert and oriented to person, place, and time.  Skin: Skin is warm and dry. No rash noted. No erythema.  Psychiatric: He has a normal mood and affect. His behavior is normal.       Assessment & Plan:  - will check quantiferon. If positive, would recommend to pursue LTBI. Would do inh since pt has some drug interaction with rifampin.  - chronic cough = will check afb cx plus cxr

## 2015-07-23 ENCOUNTER — Other Ambulatory Visit: Payer: Self-pay

## 2015-08-23 ENCOUNTER — Other Ambulatory Visit: Payer: Self-pay | Admitting: Specialist

## 2015-08-23 DIAGNOSIS — M545 Low back pain: Secondary | ICD-10-CM

## 2015-08-26 ENCOUNTER — Other Ambulatory Visit: Payer: Self-pay | Admitting: Cardiology

## 2015-08-26 NOTE — Telephone Encounter (Signed)
Should patients current dose be 42m or 120m At patients 01/11/15 office visit, he was told to reduce the dose to 73m37mbut per snapshot it is 81m69mlease advise. Thanks, MI

## 2015-08-27 ENCOUNTER — Other Ambulatory Visit: Payer: Self-pay | Admitting: *Deleted

## 2015-08-27 MED ORDER — PRASUGREL HCL 5 MG PO TABS: 5.0000 mg | ORAL_TABLET | Freq: Every day | ORAL | Status: DC

## 2015-08-27 NOTE — Telephone Encounter (Signed)
Spoke with patient and he stated that he takes one 85m tablet of the effient daily. Will send in correct rx and update chart.

## 2015-08-27 NOTE — Telephone Encounter (Signed)
Dr Claris Gladden note from August 2016 indicates he should be on Effient 59m daily. I would ask the patient if someone else recommended he take 142mdaily since his office visit with Dr McAundra Dubinn August 2016, otherwise Dr McAundra Dubinould prescribe Effient 71m64maily.

## 2015-08-30 ENCOUNTER — Ambulatory Visit
Admission: RE | Admit: 2015-08-30 | Discharge: 2015-08-30 | Disposition: A | Discharge status: Home / Self Care | Payer: Medicare PPO | Source: Ambulatory Visit | Attending: Specialist | Admitting: Specialist

## 2015-08-30 ENCOUNTER — Other Ambulatory Visit (HOSPITAL_COMMUNITY): Payer: Self-pay | Admitting: Psychiatry

## 2015-08-30 DIAGNOSIS — M545 Low back pain: Secondary | ICD-10-CM

## 2015-08-30 MED ORDER — GADOBENATE DIMEGLUMINE 529 MG/ML IV SOLN
20.0000 mL | Freq: Once | INTRAVENOUS | Status: AC | PRN
  Administered 2015-08-30: 20 mL via INTRAVENOUS

## 2015-09-02 ENCOUNTER — Other Ambulatory Visit: Payer: Self-pay | Admitting: Cardiology

## 2015-09-02 ENCOUNTER — Other Ambulatory Visit (HOSPITAL_COMMUNITY): Payer: Self-pay | Admitting: Psychiatry

## 2015-09-02 ENCOUNTER — Encounter (HOSPITAL_COMMUNITY): Payer: Self-pay | Admitting: Anesthesiology

## 2015-09-02 ENCOUNTER — Other Ambulatory Visit (HOSPITAL_COMMUNITY): Payer: Self-pay | Admitting: Family

## 2015-09-02 ENCOUNTER — Inpatient Hospital Stay (HOSPITAL_COMMUNITY)
Admission: EM | Admit: 2015-09-02 | Discharge: 2015-09-07 | DRG: 493 | Disposition: A | Discharge status: Skilled Nursing Facility | Payer: Medicare PPO | Source: Other Acute Inpatient Hospital | Attending: Orthopedic Surgery | Admitting: Orthopedic Surgery

## 2015-09-02 ENCOUNTER — Encounter (HOSPITAL_COMMUNITY)
Admission: EM | Disposition: A | Discharge status: Skilled Nursing Facility | Payer: Self-pay | Attending: Orthopedic Surgery

## 2015-09-02 ENCOUNTER — Inpatient Hospital Stay (HOSPITAL_COMMUNITY): Payer: Medicare PPO

## 2015-09-02 ENCOUNTER — Ambulatory Visit (HOSPITAL_COMMUNITY): Payer: Medicare PPO | Admitting: Anesthesiology

## 2015-09-02 DIAGNOSIS — I252 Old myocardial infarction: Secondary | ICD-10-CM

## 2015-09-02 DIAGNOSIS — G894 Chronic pain syndrome: Secondary | ICD-10-CM | POA: Diagnosis present

## 2015-09-02 DIAGNOSIS — Z803 Family history of malignant neoplasm of breast: Secondary | ICD-10-CM

## 2015-09-02 DIAGNOSIS — Z955 Presence of coronary angioplasty implant and graft: Secondary | ICD-10-CM

## 2015-09-02 DIAGNOSIS — Z6841 Body Mass Index (BMI) 40.0 and over, adult: Secondary | ICD-10-CM | POA: Diagnosis not present

## 2015-09-02 DIAGNOSIS — S82899A Other fracture of unspecified lower leg, initial encounter for closed fracture: Secondary | ICD-10-CM

## 2015-09-02 DIAGNOSIS — Z8042 Family history of malignant neoplasm of prostate: Secondary | ICD-10-CM

## 2015-09-02 DIAGNOSIS — S82899B Other fracture of unspecified lower leg, initial encounter for open fracture type I or II: Secondary | ICD-10-CM | POA: Diagnosis present

## 2015-09-02 DIAGNOSIS — Z96653 Presence of artificial knee joint, bilateral: Secondary | ICD-10-CM | POA: Diagnosis present

## 2015-09-02 DIAGNOSIS — S82841C Displaced bimalleolar fracture of right lower leg, initial encounter for open fracture type IIIA, IIIB, or IIIC: Principal | ICD-10-CM | POA: Diagnosis present

## 2015-09-02 DIAGNOSIS — Z7984 Long term (current) use of oral hypoglycemic drugs: Secondary | ICD-10-CM

## 2015-09-02 DIAGNOSIS — Z87891 Personal history of nicotine dependence: Secondary | ICD-10-CM | POA: Diagnosis not present

## 2015-09-02 DIAGNOSIS — Z452 Encounter for adjustment and management of vascular access device: Secondary | ICD-10-CM

## 2015-09-02 DIAGNOSIS — F418 Other specified anxiety disorders: Secondary | ICD-10-CM | POA: Diagnosis present

## 2015-09-02 DIAGNOSIS — Z808 Family history of malignant neoplasm of other organs or systems: Secondary | ICD-10-CM | POA: Diagnosis not present

## 2015-09-02 DIAGNOSIS — E782 Mixed hyperlipidemia: Secondary | ICD-10-CM | POA: Diagnosis present

## 2015-09-02 DIAGNOSIS — I10 Essential (primary) hypertension: Secondary | ICD-10-CM | POA: Diagnosis present

## 2015-09-02 DIAGNOSIS — F3162 Bipolar disorder, current episode mixed, moderate: Secondary | ICD-10-CM

## 2015-09-02 DIAGNOSIS — I2510 Atherosclerotic heart disease of native coronary artery without angina pectoris: Secondary | ICD-10-CM | POA: Diagnosis present

## 2015-09-02 DIAGNOSIS — S82891C Other fracture of right lower leg, initial encounter for open fracture type IIIA, IIIB, or IIIC: Secondary | ICD-10-CM | POA: Diagnosis present

## 2015-09-02 DIAGNOSIS — G4733 Obstructive sleep apnea (adult) (pediatric): Secondary | ICD-10-CM | POA: Diagnosis present

## 2015-09-02 DIAGNOSIS — E1152 Type 2 diabetes mellitus with diabetic peripheral angiopathy with gangrene: Secondary | ICD-10-CM | POA: Diagnosis present

## 2015-09-02 DIAGNOSIS — F319 Bipolar disorder, unspecified: Secondary | ICD-10-CM

## 2015-09-02 DIAGNOSIS — Z7982 Long term (current) use of aspirin: Secondary | ICD-10-CM | POA: Diagnosis not present

## 2015-09-02 HISTORY — DX: Other fracture of unspecified lower leg, initial encounter for closed fracture: S82.899A

## 2015-09-02 HISTORY — PX: ORIF ANKLE FRACTURE: SHX5408

## 2015-09-02 HISTORY — DX: Other fracture of unspecified lower leg, initial encounter for open fracture type I or II: S82.899B

## 2015-09-02 HISTORY — PX: PERIPHERALLY INSERTED CENTRAL CATHETER INSERTION: SHX2221

## 2015-09-02 LAB — COMPREHENSIVE METABOLIC PANEL
ALBUMIN: 3.8 g/dL (ref 3.5–5.0)
ALT: 18 U/L (ref 17–63)
ANION GAP: 12 (ref 5–15)
AST: 40 U/L (ref 15–41)
Alkaline Phosphatase: 59 U/L (ref 38–126)
BILIRUBIN TOTAL: 0.9 mg/dL (ref 0.3–1.2)
BUN: 14 mg/dL (ref 6–20)
CO2: 24 mmol/L (ref 22–32)
Calcium: 8.8 mg/dL — ABNORMAL LOW (ref 8.9–10.3)
Chloride: 100 mmol/L — ABNORMAL LOW (ref 101–111)
Creatinine, Ser: 0.87 mg/dL (ref 0.61–1.24)
Glucose, Bld: 109 mg/dL — ABNORMAL HIGH (ref 65–99)
POTASSIUM: 4.4 mmol/L (ref 3.5–5.1)
Sodium: 136 mmol/L (ref 135–145)
TOTAL PROTEIN: 6.6 g/dL (ref 6.5–8.1)

## 2015-09-02 LAB — GLUCOSE, CAPILLARY
Glucose-Capillary: 92 mg/dL (ref 65–99)
Glucose-Capillary: 130 mg/dL — ABNORMAL HIGH (ref 65–99)

## 2015-09-02 LAB — POCT I-STAT 4, (NA,K, GLUC, HGB,HCT)
Glucose, Bld: 111 mg/dL — ABNORMAL HIGH (ref 65–99)
HEMATOCRIT: 36 % — AB (ref 39.0–52.0)
HEMOGLOBIN: 12.2 g/dL — AB (ref 13.0–17.0)
POTASSIUM: 4.3 mmol/L (ref 3.5–5.1)
SODIUM: 136 mmol/L (ref 135–145)

## 2015-09-02 LAB — CBC
HEMATOCRIT: 36 % — AB (ref 39.0–52.0)
Hemoglobin: 11.8 g/dL — ABNORMAL LOW (ref 13.0–17.0)
MCH: 29.8 pg (ref 26.0–34.0)
MCHC: 32.8 g/dL (ref 30.0–36.0)
MCV: 90.9 fL (ref 78.0–100.0)
Platelets: 94 10*3/uL — ABNORMAL LOW (ref 150–400)
RBC: 3.96 MIL/uL — ABNORMAL LOW (ref 4.22–5.81)
RDW: 13.5 % (ref 11.5–15.5)
WBC: 4.6 10*3/uL (ref 4.0–10.5)

## 2015-09-02 LAB — SURGICAL PCR SCREEN
MRSA, PCR: NEGATIVE
Staphylococcus aureus: NEGATIVE

## 2015-09-02 SURGERY — OPEN REDUCTION INTERNAL FIXATION (ORIF) ANKLE FRACTURE
Anesthesia: General | Site: Ankle | Laterality: Right

## 2015-09-02 MED ORDER — ONDANSETRON HCL 4 MG/2ML IJ SOLN
INTRAMUSCULAR | Status: AC
  Filled 2015-09-02: qty 2

## 2015-09-02 MED ORDER — INSULIN ASPART 100 UNIT/ML ~~LOC~~ SOLN
0.0000 [IU] | Freq: Three times a day (TID) | SUBCUTANEOUS | Status: DC
  Administered 2015-09-07: 2 [IU] via SUBCUTANEOUS

## 2015-09-02 MED ORDER — MIDAZOLAM HCL 5 MG/5ML IJ SOLN
INTRAMUSCULAR | Status: DC | PRN
  Administered 2015-09-02: 2 mg via INTRAVENOUS

## 2015-09-02 MED ORDER — PANTOPRAZOLE SODIUM 40 MG PO TBEC
40.0000 mg | DELAYED_RELEASE_TABLET | Freq: Every day | ORAL | Status: DC
Start: 2015-09-03 — End: 2015-09-07
  Administered 2015-09-03 – 2015-09-07 (×5): 40 mg via ORAL
  Filled 2015-09-02 (×5): qty 1

## 2015-09-02 MED ORDER — METOCLOPRAMIDE HCL 5 MG PO TABS: 5.0000 mg | ORAL_TABLET | Freq: Three times a day (TID) | ORAL | Status: DC | PRN

## 2015-09-02 MED ORDER — PROPOFOL 10 MG/ML IV BOLUS
INTRAVENOUS | Status: DC | PRN
Start: 2015-09-02 — End: 2015-09-02
  Administered 2015-09-02: 200 mg via INTRAVENOUS

## 2015-09-02 MED ORDER — POLYETHYLENE GLYCOL 3350 17 G PO PACK: 17.0000 g | PACK | Freq: Every day | ORAL | Status: DC | PRN

## 2015-09-02 MED ORDER — HYDROMORPHONE HCL 1 MG/ML IJ SOLN
0.2500 mg | INTRAMUSCULAR | Status: DC | PRN
  Administered 2015-09-02 (×4): 0.5 mg via INTRAVENOUS

## 2015-09-02 MED ORDER — FENTANYL CITRATE (PF) 100 MCG/2ML IJ SOLN
INTRAMUSCULAR | Status: DC | PRN
  Administered 2015-09-02: 50 ug via INTRAVENOUS
  Administered 2015-09-02: 100 ug via INTRAVENOUS
  Administered 2015-09-02 (×2): 50 ug via INTRAVENOUS

## 2015-09-02 MED ORDER — METHOCARBAMOL 1000 MG/10ML IJ SOLN
500.0000 mg | Freq: Four times a day (QID) | INTRAVENOUS | Status: DC | PRN
  Filled 2015-09-02: qty 5

## 2015-09-02 MED ORDER — MEPERIDINE HCL 25 MG/ML IJ SOLN: 6.2500 mg | INTRAMUSCULAR | Status: DC | PRN

## 2015-09-02 MED ORDER — ONDANSETRON HCL 4 MG/2ML IJ SOLN: 4.0000 mg | Freq: Four times a day (QID) | INTRAMUSCULAR | Status: DC | PRN

## 2015-09-02 MED ORDER — SUGAMMADEX SODIUM 200 MG/2ML IV SOLN
INTRAVENOUS | Status: DC | PRN
  Administered 2015-09-02: 300 mg via INTRAVENOUS

## 2015-09-02 MED ORDER — TAMSULOSIN HCL 0.4 MG PO CAPS
0.4000 mg | ORAL_CAPSULE | Freq: Every day | ORAL | Status: DC
  Administered 2015-09-03 – 2015-09-07 (×5): 0.4 mg via ORAL
  Filled 2015-09-02 (×5): qty 1

## 2015-09-02 MED ORDER — PROPOFOL 10 MG/ML IV BOLUS
INTRAVENOUS | Status: AC
  Filled 2015-09-02: qty 20

## 2015-09-02 MED ORDER — HYDROMORPHONE HCL 1 MG/ML IJ SOLN
1.0000 mg | INTRAMUSCULAR | Status: DC | PRN
  Administered 2015-09-03 – 2015-09-05 (×2): 1 mg via INTRAVENOUS
  Filled 2015-09-02 (×2): qty 1

## 2015-09-02 MED ORDER — OXYCODONE HCL 5 MG PO TABS
30.0000 mg | ORAL_TABLET | Freq: Every day | ORAL | Status: DC
  Administered 2015-09-03 – 2015-09-07 (×22): 30 mg via ORAL
  Filled 2015-09-02 (×22): qty 6

## 2015-09-02 MED ORDER — KETOROLAC TROMETHAMINE 15 MG/ML IJ SOLN
15.0000 mg | Freq: Four times a day (QID) | INTRAMUSCULAR | Status: AC
  Administered 2015-09-03 (×4): 15 mg via INTRAVENOUS
  Filled 2015-09-02 (×4): qty 1

## 2015-09-02 MED ORDER — OXYCODONE HCL 5 MG PO TABS: 5.0000 mg | ORAL_TABLET | Freq: Once | ORAL | Status: DC | PRN

## 2015-09-02 MED ORDER — MIDAZOLAM HCL 2 MG/2ML IJ SOLN
INTRAMUSCULAR | Status: AC
  Filled 2015-09-02: qty 2

## 2015-09-02 MED ORDER — ARIPIPRAZOLE 5 MG PO TABS
5.0000 mg | ORAL_TABLET | Freq: Every day | ORAL | Status: DC
  Administered 2015-09-03 – 2015-09-06 (×5): 5 mg via ORAL
  Filled 2015-09-02 (×5): qty 1

## 2015-09-02 MED ORDER — PHENYLEPHRINE HCL 10 MG/ML IJ SOLN
INTRAMUSCULAR | Status: DC | PRN
  Administered 2015-09-02: 40 ug via INTRAVENOUS

## 2015-09-02 MED ORDER — ASPIRIN EC 325 MG PO TBEC
325.0000 mg | DELAYED_RELEASE_TABLET | Freq: Every day | ORAL | Status: DC
  Administered 2015-09-03 – 2015-09-07 (×5): 325 mg via ORAL
  Filled 2015-09-02 (×5): qty 1

## 2015-09-02 MED ORDER — ONDANSETRON HCL 4 MG/2ML IJ SOLN
INTRAMUSCULAR | Status: DC | PRN
  Administered 2015-09-02: 4 mg via INTRAVENOUS

## 2015-09-02 MED ORDER — CEFAZOLIN SODIUM-DEXTROSE 2-4 GM/100ML-% IV SOLN
2.0000 g | Freq: Four times a day (QID) | INTRAVENOUS | Status: AC
  Administered 2015-09-03 (×3): 2 g via INTRAVENOUS
  Filled 2015-09-02 (×3): qty 100

## 2015-09-02 MED ORDER — ROCURONIUM BROMIDE 50 MG/5ML IV SOLN
INTRAVENOUS | Status: AC
  Filled 2015-09-02: qty 1

## 2015-09-02 MED ORDER — LACTATED RINGERS IV SOLN
INTRAVENOUS | Status: DC
  Administered 2015-09-02 (×2): via INTRAVENOUS

## 2015-09-02 MED ORDER — EPHEDRINE SULFATE 50 MG/ML IJ SOLN
INTRAMUSCULAR | Status: AC
  Filled 2015-09-02: qty 1

## 2015-09-02 MED ORDER — ATORVASTATIN CALCIUM 20 MG PO TABS
20.0000 mg | ORAL_TABLET | Freq: Every day | ORAL | Status: DC
  Administered 2015-09-03 – 2015-09-07 (×6): 20 mg via ORAL
  Filled 2015-09-02 (×6): qty 1

## 2015-09-02 MED ORDER — HYDROMORPHONE HCL 1 MG/ML IJ SOLN
INTRAMUSCULAR | Status: AC
  Filled 2015-09-02: qty 1

## 2015-09-02 MED ORDER — CEFAZOLIN SODIUM-DEXTROSE 2-4 GM/100ML-% IV SOLN: 2.0000 g | INTRAVENOUS | Status: DC

## 2015-09-02 MED ORDER — LIDOCAINE HCL (CARDIAC) 20 MG/ML IV SOLN
INTRAVENOUS | Status: DC | PRN
  Administered 2015-09-02: 80 mg via INTRAVENOUS

## 2015-09-02 MED ORDER — METOCLOPRAMIDE HCL 5 MG/ML IJ SOLN: 5.0000 mg | Freq: Three times a day (TID) | INTRAMUSCULAR | Status: DC | PRN

## 2015-09-02 MED ORDER — DOCUSATE SODIUM 100 MG PO CAPS
100.0000 mg | ORAL_CAPSULE | Freq: Two times a day (BID) | ORAL | Status: DC
  Administered 2015-09-03 – 2015-09-07 (×9): 100 mg via ORAL
  Filled 2015-09-02 (×10): qty 1

## 2015-09-02 MED ORDER — OXYCODONE HCL 5 MG/5ML PO SOLN: 5.0000 mg | Freq: Once | ORAL | Status: DC | PRN

## 2015-09-02 MED ORDER — METHOCARBAMOL 500 MG PO TABS
500.0000 mg | ORAL_TABLET | Freq: Four times a day (QID) | ORAL | Status: DC | PRN
  Administered 2015-09-03 – 2015-09-04 (×3): 500 mg via ORAL
  Filled 2015-09-02 (×3): qty 1

## 2015-09-02 MED ORDER — FENTANYL CITRATE (PF) 250 MCG/5ML IJ SOLN
INTRAMUSCULAR | Status: AC
  Filled 2015-09-02: qty 5

## 2015-09-02 MED ORDER — LIDOCAINE HCL (CARDIAC) 20 MG/ML IV SOLN
INTRAVENOUS | Status: AC
  Filled 2015-09-02: qty 5

## 2015-09-02 MED ORDER — TRAZODONE HCL 100 MG PO TABS
100.0000 mg | ORAL_TABLET | Freq: Every day | ORAL | Status: DC
  Administered 2015-09-03 – 2015-09-06 (×5): 100 mg via ORAL
  Filled 2015-09-02 (×5): qty 1

## 2015-09-02 MED ORDER — CLONAZEPAM 1 MG PO TABS
1.0000 mg | ORAL_TABLET | Freq: Every day | ORAL | Status: DC
  Administered 2015-09-03 – 2015-09-06 (×5): 1 mg via ORAL
  Filled 2015-09-02 (×5): qty 1

## 2015-09-02 MED ORDER — SUCCINYLCHOLINE CHLORIDE 20 MG/ML IJ SOLN
INTRAMUSCULAR | Status: DC | PRN
  Administered 2015-09-02: 120 mg via INTRAVENOUS

## 2015-09-02 MED ORDER — PRASUGREL HCL 10 MG PO TABS
5.0000 mg | ORAL_TABLET | Freq: Every day | ORAL | Status: DC
Start: 2015-09-03 — End: 2015-09-07
  Administered 2015-09-03 – 2015-09-07 (×5): 5 mg via ORAL
  Filled 2015-09-02 (×5): qty 1

## 2015-09-02 MED ORDER — ACETAMINOPHEN 650 MG RE SUPP: 650.0000 mg | Freq: Four times a day (QID) | RECTAL | Status: DC | PRN

## 2015-09-02 MED ORDER — MAGNESIUM CITRATE PO SOLN: 1.0000 | Freq: Once | ORAL | Status: DC | PRN

## 2015-09-02 MED ORDER — CHLORHEXIDINE GLUCONATE 4 % EX LIQD: 60.0000 mL | Freq: Once | CUTANEOUS | Status: DC

## 2015-09-02 MED ORDER — PREGABALIN 100 MG PO CAPS
100.0000 mg | ORAL_CAPSULE | Freq: Three times a day (TID) | ORAL | Status: DC
  Administered 2015-09-03 – 2015-09-07 (×14): 100 mg via ORAL
  Filled 2015-09-02 (×14): qty 1

## 2015-09-02 MED ORDER — 0.9 % SODIUM CHLORIDE (POUR BTL) OPTIME
TOPICAL | Status: DC | PRN
  Administered 2015-09-02: 1000 mL

## 2015-09-02 MED ORDER — BISACODYL 10 MG RE SUPP: 10.0000 mg | Freq: Every day | RECTAL | Status: DC | PRN

## 2015-09-02 MED ORDER — SUCCINYLCHOLINE CHLORIDE 20 MG/ML IJ SOLN
INTRAMUSCULAR | Status: AC
  Filled 2015-09-02: qty 1

## 2015-09-02 MED ORDER — MORPHINE SULFATE ER 30 MG PO TBCR
30.0000 mg | EXTENDED_RELEASE_TABLET | Freq: Two times a day (BID) | ORAL | Status: DC
  Administered 2015-09-03 – 2015-09-07 (×9): 30 mg via ORAL
  Filled 2015-09-02 (×3): qty 1
  Filled 2015-09-02: qty 2
  Filled 2015-09-02 (×3): qty 1
  Filled 2015-09-02: qty 2
  Filled 2015-09-02: qty 1

## 2015-09-02 MED ORDER — ONDANSETRON HCL 4 MG PO TABS: 4.0000 mg | ORAL_TABLET | Freq: Four times a day (QID) | ORAL | Status: DC | PRN

## 2015-09-02 MED ORDER — CEFAZOLIN SODIUM-DEXTROSE 2-4 GM/100ML-% IV SOLN
INTRAVENOUS | Status: AC
  Administered 2015-09-02: 3 g via INTRAVENOUS
  Filled 2015-09-02: qty 100

## 2015-09-02 MED ORDER — FLUOXETINE HCL 20 MG PO CAPS
40.0000 mg | ORAL_CAPSULE | Freq: Every day | ORAL | Status: DC
  Administered 2015-09-03 – 2015-09-07 (×5): 40 mg via ORAL
  Filled 2015-09-02 (×5): qty 2

## 2015-09-02 MED ORDER — MUPIROCIN 2 % EX OINT
TOPICAL_OINTMENT | CUTANEOUS | Status: AC
  Administered 2015-09-02: 19:00:00
  Filled 2015-09-02: qty 22

## 2015-09-02 MED ORDER — METFORMIN HCL 500 MG PO TABS
500.0000 mg | ORAL_TABLET | Freq: Two times a day (BID) | ORAL | Status: DC
  Administered 2015-09-03 – 2015-09-07 (×9): 500 mg via ORAL
  Filled 2015-09-02 (×9): qty 1

## 2015-09-02 MED ORDER — ACETAMINOPHEN 325 MG PO TABS: 650.0000 mg | ORAL_TABLET | Freq: Four times a day (QID) | ORAL | Status: DC | PRN

## 2015-09-02 MED ORDER — ROCURONIUM BROMIDE 100 MG/10ML IV SOLN
INTRAVENOUS | Status: DC | PRN
  Administered 2015-09-02 (×2): 25 mg via INTRAVENOUS

## 2015-09-02 MED ORDER — SODIUM CHLORIDE 0.9 % IV SOLN
INTRAVENOUS | Status: DC
  Administered 2015-09-03: 01:00:00 via INTRAVENOUS

## 2015-09-02 MED ORDER — PHENYLEPHRINE 40 MCG/ML (10ML) SYRINGE FOR IV PUSH (FOR BLOOD PRESSURE SUPPORT)
PREFILLED_SYRINGE | INTRAVENOUS | Status: AC
  Filled 2015-09-02: qty 10

## 2015-09-02 MED ORDER — SUGAMMADEX SODIUM 500 MG/5ML IV SOLN
INTRAVENOUS | Status: AC
  Filled 2015-09-02: qty 5

## 2015-09-02 SURGICAL SUPPLY — 58 items
BANDAGE ESMARK 6X9 LF (GAUZE/BANDAGES/DRESSINGS) IMPLANT
BIT DRILL 2.5X110 QC LCP DISP (BIT) ×1 IMPLANT
BNDG CMPR 9X6 STRL LF SNTH (GAUZE/BANDAGES/DRESSINGS) ×1
BNDG COHESIVE 4X5 TAN STRL (GAUZE/BANDAGES/DRESSINGS) ×1 IMPLANT
BNDG ESMARK 6X9 LF (GAUZE/BANDAGES/DRESSINGS) ×2
BNDG GAUZE ELAST 4 BULKY (GAUZE/BANDAGES/DRESSINGS) ×1 IMPLANT
CANISTER WOUND CARE 500ML ATS (WOUND CARE) ×1 IMPLANT
COVER SURGICAL LIGHT HANDLE (MISCELLANEOUS) ×3 IMPLANT
CUFF TOURNIQUET SINGLE 34IN LL (TOURNIQUET CUFF) IMPLANT
CUFF TOURNIQUET SINGLE 44IN (TOURNIQUET CUFF) IMPLANT
DRAPE INCISE IOBAN 66X45 STRL (DRAPES) ×2 IMPLANT
DRAPE OEC MINIVIEW 54X84 (DRAPES) ×1 IMPLANT
DRAPE PROXIMA HALF (DRAPES) ×2 IMPLANT
DRAPE U-SHAPE 47X51 STRL (DRAPES) ×2 IMPLANT
DRSG ADAPTIC 3X8 NADH LF (GAUZE/BANDAGES/DRESSINGS) ×1 IMPLANT
DRSG MEPILEX BORDER 4X4 (GAUZE/BANDAGES/DRESSINGS) ×1 IMPLANT
DRSG PAD ABDOMINAL 8X10 ST (GAUZE/BANDAGES/DRESSINGS) ×1 IMPLANT
DRSG VAC ATS MED SENSATRAC (GAUZE/BANDAGES/DRESSINGS) ×1 IMPLANT
DURAPREP 26ML APPLICATOR (WOUND CARE) ×2 IMPLANT
ELECT REM PT RETURN 9FT ADLT (ELECTROSURGICAL) ×2
ELECTRODE REM PT RTRN 9FT ADLT (ELECTROSURGICAL) ×1 IMPLANT
GAUZE SPONGE 4X4 12PLY STRL (GAUZE/BANDAGES/DRESSINGS) ×1 IMPLANT
GLOVE BIOGEL PI IND STRL 9 (GLOVE) ×1 IMPLANT
GLOVE BIOGEL PI INDICATOR 9 (GLOVE) ×1
GLOVE SURG ORTHO 9.0 STRL STRW (GLOVE) ×2 IMPLANT
GOWN STRL REUS W/ TWL XL LVL3 (GOWN DISPOSABLE) ×3 IMPLANT
GOWN STRL REUS W/TWL XL LVL3 (GOWN DISPOSABLE) ×6
KIT 1/3 TUB PL 10H 121 (Orthopedic Implant) IMPLANT
KIT BASIN OR (CUSTOM PROCEDURE TRAY) ×2 IMPLANT
KIT ROOM TURNOVER OR (KITS) ×2 IMPLANT
MANIFOLD NEPTUNE II (INSTRUMENTS) ×2 IMPLANT
NS IRRIG 1000ML POUR BTL (IV SOLUTION) ×2 IMPLANT
PACK ORTHO EXTREMITY (CUSTOM PROCEDURE TRAY) ×2 IMPLANT
PAD ARMBOARD 7.5X6 YLW CONV (MISCELLANEOUS) ×5 IMPLANT
PREVENA INCISION MGT 90 150 (MISCELLANEOUS) ×1 IMPLANT
PROS 1/3 TUB PL 10H 121 (Orthopedic Implant) ×2 IMPLANT
SCREW CORTEX 3.5 12MM (Screw) ×3 IMPLANT
SCREW CORTEX 3.5 14MM (Screw) ×1 IMPLANT
SCREW CORTEX 3.5 22MM (Screw) ×1 IMPLANT
SCREW CORTEX 3.5 55MM (Screw) ×1 IMPLANT
SCREW CORTEX 3.5 60MM (Screw) ×1 IMPLANT
SCREW LOCK CORT ST 3.5X12 (Screw) IMPLANT
SCREW LOCK CORT ST 3.5X14 (Screw) IMPLANT
SCREW LOCK CORT ST 3.5X22 (Screw) IMPLANT
SCREW LOCK T15 FT 14X3.5X2.9X (Screw) IMPLANT
SCREW LOCKING 3.5X14 (Screw) ×2 IMPLANT
SLEEVE SURGEON STRL (DRAPES) ×1 IMPLANT
SPONGE LAP 18X18 X RAY DECT (DISPOSABLE) ×2 IMPLANT
STAPLER VISISTAT 35W (STAPLE) IMPLANT
STOCKINETTE IMPERVIOUS LG (DRAPES) ×1 IMPLANT
SUCTION FRAZIER HANDLE 10FR (MISCELLANEOUS) ×1
SUCTION TUBE FRAZIER 10FR DISP (MISCELLANEOUS) ×1 IMPLANT
SUT ETHILON 2 0 PSLX (SUTURE) ×1 IMPLANT
SUT VIC AB 2-0 CT1 27 (SUTURE) ×4
SUT VIC AB 2-0 CT1 TAPERPNT 27 (SUTURE) ×2 IMPLANT
TOWEL OR 17X24 6PK STRL BLUE (TOWEL DISPOSABLE) ×2 IMPLANT
TOWEL OR 17X26 10 PK STRL BLUE (TOWEL DISPOSABLE) ×2 IMPLANT
TUBE CONNECTING 12X1/4 (SUCTIONS) ×2 IMPLANT

## 2015-09-02 NOTE — Transfer of Care (Signed)
Immediate Anesthesia Transfer of Care Note  Patient: Travis Guardado Sr.  Procedure(s) Performed: Procedure(s): OPEN REDUCTION INTERNAL FIXATION (ORIF) ANKLE FRACTURE (Right)  Patient Location: PACU  Anesthesia Type:General  Level of Consciousness: awake and alert   Airway & Oxygen Therapy: Patient Spontanous Breathing and Patient connected to face mask oxygen  Post-op Assessment: Report given to RN, Post -op Vital signs reviewed and stable and Patient moving all extremities X 4  Post vital signs: stable  Last Vitals:  Filed Vitals:   09/02/15 1936 09/02/15 2217  BP: 147/54   Pulse: 86   Temp: 37.2 C 36.8 C  Resp: 18     Complications: No apparent anesthesia complications

## 2015-09-02 NOTE — Anesthesia Preprocedure Evaluation (Addendum)
Anesthesia Evaluation  Patient identified by MRN, date of birth, ID band Patient awake    Reviewed: Allergy & Precautions, NPO status , Patient's Chart, lab work & pertinent test results  Airway Mallampati: II  TM Distance: >3 FB Neck ROM: Full    Dental  (+) Teeth Intact, Dental Advisory Given   Pulmonary sleep apnea (NO CPAP) , former smoker,    breath sounds clear to auscultation       Cardiovascular hypertension, Pt. on medications + CAD, + Past MI and + Peripheral Vascular Disease   Rhythm:Regular Rate:Normal     Neuro/Psych    GI/Hepatic   Endo/Other  diabetes, Well Controlled, Type 2, Oral Hypoglycemic AgentsMorbid obesity  Renal/GU      Musculoskeletal   Abdominal   Peds  Hematology   Anesthesia Other Findings   Reproductive/Obstetrics                            Anesthesia Physical Anesthesia Plan  ASA: III  Anesthesia Plan: General   Post-op Pain Management:    Induction: Intravenous  Airway Management Planned: Oral ETT  Additional Equipment:   Intra-op Plan:   Post-operative Plan: Extubation in OR  Informed Consent: I have reviewed the patients History and Physical, chart, labs and discussed the procedure including the risks, benefits and alternatives for the proposed anesthesia with the patient or authorized representative who has indicated his/her understanding and acceptance.   Dental advisory given  Plan Discussed with: CRNA, Anesthesiologist and Surgeon  Anesthesia Plan Comments: (Plan to proceed without nerve block due to anti coagulation in place. Pt understands and agrees.)       Anesthesia Quick Evaluation

## 2015-09-02 NOTE — Op Note (Signed)
09/02/2015  10:19 PM  PATIENT:  Travis Fortis Sr.    PRE-OPERATIVE DIAGNOSIS:  right open type IIIa dislocation ankle fracture dislocation with Weber C fibular fracture and syndesmotic disruption  POST-OPERATIVE DIAGNOSIS:  Same  PROCEDURE:  OPEN REDUCTION INTERNAL FIXATION (ORIF) ANKLE FRACTURE Irrigation and debridement of skin soft tissue muscle and bone with excision of muscle and soft tissue skin and bone with a Ronjair. Reconstruction syndesmotic ligament Application of wound VAC  SURGEON:  DUDA,MARCUS V, MD  PHYSICIAN ASSISTANT:None ANESTHESIA:   General  PREOPERATIVE INDICATIONS:  [name] is a  65 y.o. male with a diagnosis of right open dislocation ankle fracture who failed conservative measures and elected for surgical management.    The risks benefits and alternatives were discussed with the patient preoperatively including but not limited to the risks of infection, bleeding, nerve injury, cardiopulmonary complications, the need for revision surgery, among others, and the patient was willing to proceed.  OPERATIVE IMPLANTS: 10 hole one third tubular plate with locking and compression screws with syndesmotic reconstruction  OPERATIVE FINDINGS: Ischemic wound over the medial malleolus secondary to dislocation for 2 days  OPERATIVE PROCEDURE: Patient was brought to the operating room and underwent a general anesthetic. After adequate levels anesthesia were obtained patient's right lower extremity was prepped using DuraPrep draped into a sterile field. A timeout was called. A lateral incision was made over the fibula. The fibula was in segmental fragments with 4 fragments to the fibula. The 2 distal fragments were reconstructed with a lag screw. The joint was irrigated with normal saline. A one third tubular 10 hole plate was secured to the distal fragments with a compression screw and then with a locking screw. Using the plate for reduction the fibula was brought  out to length and then compression screws were placed proximally. This restored the length of the fibula. The syndesmosis was wide and 2 syndesmotic screws were placed 3 cortices to stabilize the syndesmosis. C-arm floss be verified alignment both AP and lateral planes. Patient had impaction crush of the lateral tibial plafond and lateral talar dome. This area was irrigated. Medially there was necrotic tissue this was debrided with a 10 blade knife. There was still a very thin layer of tissue and this was left intact. After irrigation debridement of the wounds the lateral incision was closed using 2-0 nylon. A wound VAC was applied in a T fashion with the wound VAC over the lateral incision and extended across the ankle to the medial wound. This had a good suction fit this was set to 100 mm of suction. Patient was extubated taken to the PACU in stable condition will place the patient in a fracture boot nonweightbearing anticipate at least 5 days for mobilization prior to determining if his limb is salvageable.

## 2015-09-02 NOTE — Anesthesia Procedure Notes (Addendum)
Procedure Name: Intubation Date/Time: 09/02/2015 9:05 PM Performed by: Rejeana Brock L Pre-anesthesia Checklist: Patient identified, Timeout performed, Emergency Drugs available, Suction available and Patient being monitored Patient Re-evaluated:Patient Re-evaluated prior to inductionOxygen Delivery Method: Circle system utilized Preoxygenation: Pre-oxygenation with 100% oxygen Intubation Type: IV induction Ventilation: Mask ventilation without difficulty and Oral airway inserted - appropriate to patient size Laryngoscope Size: Miller and 2 Grade View: Grade I Tube type: Oral Tube size: 7.5 mm Number of attempts: 1 Airway Equipment and Method: Stylet Placement Confirmation: ETT inserted through vocal cords under direct vision,  breath sounds checked- equal and bilateral and positive ETCO2 Secured at: 23 cm Tube secured with: Tape Dental Injury: Teeth and Oropharynx as per pre-operative assessment       Left IJ vein.  Actual time was 2042.
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
## 2015-09-02 NOTE — Anesthesia Postprocedure Evaluation (Signed)
Anesthesia Post Note  Patient: Travis Partin Sr.  Procedure(s) Performed: Procedure(s) (LRB): OPEN REDUCTION INTERNAL FIXATION (ORIF) ANKLE FRACTURE (Right)  Patient location during evaluation: PACU Anesthesia Type: General Level of consciousness: awake and alert Pain management: pain level controlled Vital Signs Assessment: post-procedure vital signs reviewed and stable Respiratory status: spontaneous breathing, nonlabored ventilation, respiratory function stable and patient connected to nasal cannula oxygen Cardiovascular status: blood pressure returned to baseline and stable Postop Assessment: no signs of nausea or vomiting Anesthetic complications: no    Last Vitals:  Filed Vitals:   09/02/15 2300 09/02/15 2310  BP: 151/67   Pulse: 85 87  Temp:  36.9 C  Resp: 15 10    Last Pain:  Filed Vitals:   09/02/15 2312  PainSc: Asleep        RLE Motor Response: Purposeful movement;Responds to commands (09/02/15 2310) RLE Sensation: Decreased (09/02/15 2310)      Aara Jacquot A

## 2015-09-02 NOTE — Telephone Encounter (Signed)
Pt has appointment 09/09/15, refill of Trazodone will be given during appointment. No need to refill at this time.

## 2015-09-03 ENCOUNTER — Encounter (HOSPITAL_COMMUNITY): Payer: Self-pay | Admitting: General Practice

## 2015-09-03 LAB — GLUCOSE, CAPILLARY
GLUCOSE-CAPILLARY: 105 mg/dL — AB (ref 65–99)
GLUCOSE-CAPILLARY: 116 mg/dL — AB (ref 65–99)
GLUCOSE-CAPILLARY: 112 mg/dL — AB (ref 65–99)
Glucose-Capillary: 114 mg/dL — ABNORMAL HIGH (ref 65–99)

## 2015-09-03 NOTE — Progress Notes (Signed)
Patient ID: Travis Majano Sr., male   DOB: 06-19-1950, 65 y.o.   MRN: [id number] Patient is postoperative day 1 status post open reduction internal fixation right ankle fracture dislocation. Patient has very tenuous skin envelope. Discussed with the patient that he may require a below the knee amputation due to skin damage from the 2 day history of dislocated ankle. We'll continue with the  wound VAC through Tuesday with evaluation for surgery Tuesday evening or Wednesday morning.  Strict nonweightbearing right lower extremity.
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
## 2015-09-03 NOTE — Progress Notes (Signed)
Orthopedic Tech Progress Note Patient Details:  Travis Pavek Sr. August 29, 1950 [id number]  Ortho Devices Type of Ortho Device: CAM walker Ortho Device/Splint Location: rle Ortho Device/Splint Interventions: Ordered, Application   Karolee Stamps 09/03/2015, 12:18 AM

## 2015-09-04 LAB — GLUCOSE, CAPILLARY
GLUCOSE-CAPILLARY: 121 mg/dL — AB (ref 65–99)
Glucose-Capillary: 90 mg/dL (ref 65–99)
Glucose-Capillary: 106 mg/dL — ABNORMAL HIGH (ref 65–99)

## 2015-09-04 NOTE — Progress Notes (Signed)
Subjective: 2 Days Post-Op Procedure(s) (LRB): OPEN REDUCTION INTERNAL FIXATION (ORIF) ANKLE FRACTURE (Right) Patient reports pain as moderate.    Objective: Vital signs in last 24 hours: Temp:  [97.9 F (36.6 C)-100.1 F (37.8 C)] 100.1 F (37.8 C) (04/15 1300) Pulse Rate:  [70-88] 73 (04/15 1300) Resp:  [11-18] 11 (04/15 1300) BP: (97-124)/(41-62) 97/42 mmHg (04/15 1300) SpO2:  [95 %-99 %] 96 % (04/15 1300)  Intake/Output from previous day: 04/14 0701 - 04/15 0700 In: 720 [P.O.:720] Out: 1190 [Urine:1150; Drains:40] Intake/Output this shift:     Recent Labs  09/02/15 1949 09/02/15 1955  HGB 12.2* 11.8*    Recent Labs  09/02/15 1949 09/02/15 1955  WBC  --  4.6  RBC  --  3.96*  HCT 36.0* 36.0*  PLT  --  94*    Recent Labs  09/02/15 1949 09/02/15 1955  NA 136 136  K 4.3 4.4  CL  --  100*  CO2  --  24  BUN  --  14  CREATININE  --  0.87  GLUCOSE 111* 109*  CALCIUM  --  8.8*   No results for input(s): LABPT, INR in the last 72 hours.  ankle and foot swelling, VAC working.   Assessment/Plan: 2 Days Post-Op Procedure(s) (LRB): OPEN REDUCTION INTERNAL FIXATION (ORIF) ANKLE FRACTURE (Right) Plan:  Bedrest, elevation Travis [name] C 09/04/2015, 2:20 PM

## 2015-09-04 NOTE — NC FL2 (Signed)
[place] LEVEL OF CARE SCREENING TOOL     IDENTIFICATION  Patient Name: Travis Sternberg Sr. Birthdate: Nov 20, 1950 Sex: male Admission Date (Current Location): 09/02/2015  [facility] and [place] Number:  Herbalist and Address:  [facility] [place], [place] [address] [place], [place] [address]      Provider Number: [id number]  Attending Physician Name and Address:  Newt Minion, MD  Relative Name and Phone Number:       Current Level of Care: Hospital Recommended Level of Care: [place] Prior Approval Number:    Date Approved/Denied:   PASRR Number:  [id number]  Discharge Plan: SNF    Current Diagnoses: Patient Active Problem List   Diagnosis Date Noted  . Open ankle fracture 09/02/2015  . Fracture dislocation of ankle joint 09/02/2015  . S/P laparoscopic hernia repair 05/11/2015  . Right inguinal hernia 04/20/2015  . PPD positive 04/08/2015  . Cough 04/08/2015  . Abdominal pain 04/08/2015  . Benign neoplasm of descending colon   . Benign neoplasm of cecum   . AKI (acute kidney injury) ([place]) 10/18/2014  . Acute blood loss anemia   . Chronic anticoagulation   . Anemia 10/17/2014  . Near syncope 10/17/2014  . Closed fracture of right foot 10/17/2014  . Occult blood positive stool 10/17/2014  . Urethral stricture 10/17/2014  . Orthostatic hypotension 10/12/2014  . General weakness 07/14/2014  . Urinary incontinence 07/14/2014  . Arthritis of foot, right, degenerative 04/15/2014  . Postoperative anemia due to acute blood loss 01/30/2014    Class: Acute  . Hypotension 01/30/2014    Class: Acute  . Spinal stenosis, lumbar 01/27/2014  . Cellulitis of leg, right 10/15/2013  . Headache(784.0) 10/15/2013  . Uncontrolled type 2 DM with peripheral circulatory disorder ([place]) 10/04/2013  . Acute posthemorrhagic anemia 10/04/2013  . Acute urinary retention 09/30/2013    Class: Acute  . Spinal stenosis in cervical  region 09/26/2013    Class: Chronic  . Spinal stenosis, lumbar region, with neurogenic claudication 09/26/2013    Class: Chronic  . Cervical stenosis of spinal canal 09/26/2013  . Neck pain 09/02/2013  . Hand joint pain 06/10/2013  . Rotator cuff tear arthropathy of right shoulder 06/10/2013  . Skin lesion of cheek 05/01/2013  . Pain of right thumb 04/03/2013  . Balance disorder 03/12/2013  . Gait disorder 03/12/2013  . Tremor 03/12/2013  . Left hip pain 03/12/2013  . Low back pain 03/12/2013  . Pre-ulcerative corn or callous 02/06/2013  . Polypharmacy 06/25/2012    Class: Chronic  . Balance problems 12/25/2011  . Anxiety 11/12/2011  . OSA (obstructive sleep apnea) 11/07/2011  . Bradycardia 10/20/2011  . Insomnia 10/04/2011  . Lumbar degenerative disc disease 07/11/2011  . Obesity 01/12/2011  . Preventative health care 09/27/2010  . ERECTILE DYSFUNCTION, ORGANIC 05/30/2010  . [place] DISEASE, LUMBAR 04/19/2010  . SCIATICA, LEFT 04/19/2010  . Chronic pain syndrome 10/27/2009  . Hyperlipidemia 07/15/2009  . Depression 06/24/2009  . Essential hypertension 06/24/2009  . CAD (coronary artery disease) 06/24/2009  . Allergic rhinitis 06/24/2009  . URETHRAL STRICTURE 06/24/2009  . DEGENERATIVE JOINT DISEASE 06/24/2009  . SHOULDER PAIN, BILATERAL 06/24/2009  . FATIGUE 06/24/2009  . NEPHROLITHIASIS, HX OF 06/24/2009    Orientation RESPIRATION BLADDER Height & Weight     Self, Time, Situation, Place  Normal Continent Weight: (!) 325 lb (147.419 kg) Height:  6' 1"  (185.4 cm)  BEHAVIORAL SYMPTOMS/MOOD NEUROLOGICAL BOWEL NUTRITION STATUS  Continent Diet  AMBULATORY STATUS COMMUNICATION OF NEEDS Skin   Extensive Assist Verbally Surgical wounds                       Personal Care Assistance Level of Assistance  Bathing, Feeding, Dressing Bathing Assistance: Maximum assistance Feeding assistance: Independent Dressing Assistance: Maximum assistance     Functional  Limitations Info  Sight, Hearing, Speech Sight Info: Adequate Hearing Info: Adequate Speech Info: Adequate    SPECIAL CARE FACTORS FREQUENCY  PT (By licensed PT), OT (By licensed OT)                    Contractures      Additional Factors Info  Code Status, Allergies Code Status Info: Prior Allergies Info: KNA           Current Medications (09/04/2015):  This is the current hospital active medication list Current Facility-Administered Medications  Medication Dose Route Frequency Provider Last Rate Last Dose  . 0.9 %  sodium chloride infusion   Intravenous Continuous Newt Minion, MD 10 mL/hr at 09/03/15 0057    . acetaminophen (TYLENOL) tablet 650 mg  650 mg Oral Q6H PRN Newt Minion, MD       Or  . acetaminophen (TYLENOL) suppository 650 mg  650 mg Rectal Q6H PRN Newt Minion, MD      . ARIPiprazole (ABILIFY) tablet 5 mg  5 mg Oral QHS Newt Minion, MD   5 mg at 09/03/15 2132  . aspirin EC tablet 325 mg  325 mg Oral Daily Newt Minion, MD   325 mg at 09/04/15 1007  . atorvastatin (LIPITOR) tablet 20 mg  20 mg Oral Daily Newt Minion, MD   20 mg at 09/04/15 1008  . bisacodyl (DULCOLAX) suppository 10 mg  10 mg Rectal Daily PRN Newt Minion, MD      . clonazePAM Bobbye [place]) tablet 1 mg  1 mg Oral QHS Newt Minion, MD   1 mg at 09/03/15 2132  . docusate sodium (COLACE) capsule 100 mg  100 mg Oral BID Newt Minion, MD   100 mg at 09/04/15 1008  . FLUoxetine (PROZAC) capsule 40 mg  40 mg Oral Daily Newt Minion, MD   40 mg at 09/04/15 1008  . HYDROmorphone (DILAUDID) injection 1 mg  1 mg Intravenous Q2H PRN Newt Minion, MD   1 mg at 09/03/15 0051  . insulin aspart (novoLOG) injection 0-15 Units  0-15 Units Subcutaneous TID WC Newt Minion, MD   0 Units at 09/03/15 0800  . magnesium citrate solution 1 Bottle  1 Bottle Oral Once PRN [name] V, MD      . metFORMIN (GLUCOPHAGE) tablet 500 mg  500 mg Oral BID WC Newt Minion, MD   500 mg at 09/04/15 6962  .  methocarbamol (ROBAXIN) tablet 500 mg  500 mg Oral Q6H PRN Newt Minion, MD   500 mg at 09/04/15 0529   Or  . methocarbamol (ROBAXIN) 500 mg in dextrose 5 % 50 mL IVPB  500 mg Intravenous Q6H PRN Newt Minion, MD      . metoCLOPramide (REGLAN) tablet 5-10 mg  5-10 mg Oral Q8H PRN Newt Minion, MD       Or  . metoCLOPramide (REGLAN) injection 5-10 mg  5-10 mg Intravenous Q8H PRN Newt Minion, MD      . morphine (MS CONTIN) 12 hr tablet  30 mg  30 mg Oral Q12H Newt Minion, MD   30 mg at 09/04/15 1009  . ondansetron (ZOFRAN) tablet 4 mg  4 mg Oral Q6H PRN Newt Minion, MD       Or  . ondansetron [facility]) injection 4 mg  4 mg Intravenous Q6H PRN Newt Minion, MD      . oxyCODONE (Oxy IR/ROXICODONE) immediate release tablet 30 mg  30 mg Oral 5 X Daily Newt Minion, MD   30 mg at 09/04/15 1508  . pantoprazole (PROTONIX) EC tablet 40 mg  40 mg Oral Daily Newt Minion, MD   40 mg at 09/04/15 1008  . polyethylene glycol (MIRALAX / GLYCOLAX) packet 17 g  17 g Oral Daily PRN [name] V, MD      . prasugrel (EFFIENT) tablet 5 mg  5 mg Oral Daily Newt Minion, MD   5 mg at 09/04/15 1008  . pregabalin (LYRICA) capsule 100 mg  100 mg Oral TID Newt Minion, MD   100 mg at 09/04/15 1507  . tamsulosin (FLOMAX) capsule 0.4 mg  0.4 mg Oral QPC breakfast Newt Minion, MD   0.4 mg at 09/04/15 0820  . traZODone (DESYREL) tablet 100 mg  100 mg Oral QHS Newt Minion, MD   100 mg at 09/03/15 2132     Discharge Medications: Please see discharge summary for a list of discharge medications.  Relevant Imaging Results:  Relevant Lab Results:   Additional Information SS#: [id number]  Glendon Axe, MSW, LCSWA [phone] 09/04/2015 3:57 PM

## 2015-09-04 NOTE — Clinical Social Work Placement (Signed)
   CLINICAL SOCIAL WORK PLACEMENT  NOTE  Date:  09/04/2015  Patient Details  Name: Travis Neyhart Sr. MRN: [id number] Date of Birth: [birth year]  Clinical Social Work is seeking post-discharge placement for this patient at the [place] level of care (*CSW will initial, date and re-position this form in  chart as items are completed):  Yes   Patient/family provided with [place] Work Department's list of facilities offering this level of care within the geographic area requested by the patient (or if unable, by the patient's family).  Yes   Patient/family informed of their freedom to choose among providers that offer the needed level of care, that participate in Medicare, Medicaid or managed care program needed by the patient, have an available bed and are willing to accept the patient.  Yes   Patient/family informed of [place]'s ownership interest in [facility] and [facility], as well as of the fact that they are under no obligation to receive care at these facilities.  PASRR submitted to EDS on 09/04/15     PASRR number received on 09/04/15     Existing PASRR number confirmed on 09/04/15     FL2 transmitted to all facilities in geographic area requested by pt/family on 09/04/15     FL2 transmitted to all facilities within larger geographic area on       Patient informed that his/her managed care company has contracts with or will negotiate with certain facilities, including the following:            Patient/family informed of bed offers received.  Patient chooses bed at       Physician recommends and patient chooses bed at      Patient to be transferred to   on  .  Patient to be transferred to facility by       Patient family notified on   of transfer.  Name of family member notified:        PHYSICIAN Please sign FL2     Additional Comment:    _______________________________________________ Rozell Searing,  LCSW 09/04/2015, 3:57 PM

## 2015-09-04 NOTE — Clinical Social Work Note (Signed)
Clinical Social Work Assessment  Patient Details  Name: Travis Rosenberger Sr. MRN: [id number] Date of Birth: [birth year]  Date of referral:  09/04/15               Reason for consult:  Facility Placement, Discharge Planning                Permission sought to share information with:  Family Supports, Case Freight forwarder, Chartered certified accountant granted to share information::  Yes, Verbal Permission Granted  Name::      Angeline Slim)  Agency::   (SNF's )  Relationship::   (Brother )  Contact Information:   [phone])  Housing/Transportation Living arrangements for the past 2 months:  Single Family Home Source of Information:  Patient Patient Interpreter Needed:  None Criminal Activity/Legal Involvement Pertinent to Current Situation/Hospitalization:  No - Comment as needed Significant Relationships:  Other Family Members, Siblings Lives with:  Relatives Do you feel safe going back to the place where you live?  Yes Need for family participation in patient care:  No (Coment)  Care giving concerns:  Patient requiring short-term placement for skilled level.    Social Worker assessment / plan:  Holiday representative met with patient at bedside in reference to post-acute placement. CSW introduced CSW role and SNF process. Pt stated he is aware of PT recommendation for SNF placement. Pt stated currently he lives with his niece, her husband and their two children. Pt stated his niece and her husband work long hours during the week. Pt stated he has been in SNF, [facility] before in the past. Pt prefers [facility] as first choice. CSW explained insurance coverage and factors for length of stay at SNF. No further concerns reported at this time. CSW will continue to follow pt and pt's family for continued support and to facilitate pt's d/c needs once medically stable.   Employment status:  Retired Office manager PT Recommendations:  [place] / Referral to community resources:  [place]  Patient/Family's Response to care:  Pt a/o x4. Pt agreeable to SNF and prefers [facility]. Pt pleasant and appreciated social work intervention.   Patient/Family's Understanding of and Emotional Response to Diagnosis, Current Treatment, and Prognosis:  Pt knowledegable of medical work up. Pt to be re-evaluated for possible surgery of Tuesday evening/ Wednesday morning.   Emotional Assessment Appearance:  Appears stated age Attitude/Demeanor/Rapport:   (Pleasant ) Affect (typically observed):  Accepting, Appropriate, Pleasant Orientation:  Oriented to Situation, Oriented to  Time, Oriented to Place, Oriented to Self Alcohol / Substance use:  Not Applicable Psych involvement (Current and /or in the community):  No (Comment)  Discharge Needs  Concerns to be addressed:  Care Coordination Readmission within the last 30 days:  No Current discharge risk:  Dependent with Mobility Barriers to Discharge:  Continued Medical Work up   [employer], MSW, LCSWA [phone] 09/04/2015 3:56 PM

## 2015-09-05 LAB — GLUCOSE, CAPILLARY
Glucose-Capillary: 117 mg/dL — ABNORMAL HIGH (ref 65–99)
Glucose-Capillary: 100 mg/dL — ABNORMAL HIGH (ref 65–99)
Glucose-Capillary: 89 mg/dL (ref 65–99)

## 2015-09-05 NOTE — Clinical Social Work Note (Signed)
Clinical Social Worker continuing to follow patient and family for support and discharge planning.  Patient provided with a list of available bed offers at bedside.  Patient plans to discuss with family/friends and will notify weekday CSW of bed choice.  CSW remains available for support and to facilitate patient discharge needs once medically stable.  Travis [name], [place]

## 2015-09-05 NOTE — Progress Notes (Signed)
Subjective: 3 Days Post-Op Procedure(s) (LRB): OPEN REDUCTION INTERNAL FIXATION (ORIF) ANKLE FRACTURE (Right) Patient reports pain as moderate.    Objective: Vital signs in last 24 hours: Temp:  [98.1 F (36.7 C)-100.1 F (37.8 C)] 98.3 F (36.8 C) (04/16 0523) Pulse Rate:  [65-79] 65 (04/16 0523) Resp:  [11-16] 16 (04/16 0523) BP: (97-128)/(40-51) 128/51 mmHg (04/16 0523) SpO2:  [95 %-98 %] 98 % (04/16 0523)  Intake/Output from previous day: 04/15 0701 - 04/16 0700 In: 350 [P.O.:240; I.V.:110] Out: 1830 [Urine:1825; Drains:5] Intake/Output this shift: Total I/O In: 240 [P.O.:240] Out: 350 [Urine:350]   Recent Labs  09/02/15 1949 09/02/15 1955  HGB 12.2* 11.8*    Recent Labs  09/02/15 1949 09/02/15 1955  WBC  --  4.6  RBC  --  3.96*  HCT 36.0* 36.0*  PLT  --  94*    Recent Labs  09/02/15 1949 09/02/15 1955  NA 136 136  K 4.3 4.4  CL  --  100*  CO2  --  24  BUN  --  14  CREATININE  --  0.87  GLUCOSE 111* 109*  CALCIUM  --  8.8*   No results for input(s): LABPT, INR in the last 72 hours.  some swelling , needs to keep ankle above heart  Assessment/Plan: 3 Days Post-Op Procedure(s) (LRB): OPEN REDUCTION INTERNAL FIXATION (ORIF) ANKLE FRACTURE (Right) Plan: VAC. Elevation  Tishia Maestre C 09/05/2015, 10:30 AM

## 2015-09-06 LAB — GLUCOSE, CAPILLARY
GLUCOSE-CAPILLARY: 115 mg/dL — AB (ref 65–99)
GLUCOSE-CAPILLARY: 94 mg/dL (ref 65–99)
GLUCOSE-CAPILLARY: 108 mg/dL — AB (ref 65–99)
GLUCOSE-CAPILLARY: 121 mg/dL — AB (ref 65–99)
Glucose-Capillary: 116 mg/dL — ABNORMAL HIGH (ref 65–99)
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
## 2015-09-06 NOTE — Progress Notes (Signed)
Patient ID: Travis Mazariego Sr., male   DOB: January 16, 1951, 65 y.o.   MRN: [id number] Pt pending SNF.  [facility] care management note reviewed. Dr. Sharol Given back tomorrow. No VAC leak.

## 2015-09-06 NOTE — Care Management Important Message (Signed)
Important Message  Patient Details  Name: Travis Gilkes Sr. MRN: [id number] Date of Birth: [birth year]   Medicare Important Message Given:  Yes    Farrin Shadle P Mirage Pfefferkorn 09/06/2015, 1:58 PM

## 2015-09-07 ENCOUNTER — Encounter (HOSPITAL_COMMUNITY): Payer: Self-pay | Admitting: Orthopedic Surgery

## 2015-09-07 ENCOUNTER — Telehealth: Payer: Self-pay

## 2015-09-07 LAB — GLUCOSE, CAPILLARY
GLUCOSE-CAPILLARY: 117 mg/dL — AB (ref 65–99)
Glucose-Capillary: 123 mg/dL — ABNORMAL HIGH (ref 65–99)

## 2015-09-07 MED ORDER — OXYCODONE HCL 30 MG PO TABS: 30.0000 mg | ORAL_TABLET | Freq: Every day | ORAL | Status: DC

## 2015-09-07 MED ORDER — OPANA ER 30 MG PO T12A: 1.0000 | EXTENDED_RELEASE_TABLET | Freq: Two times a day (BID) | ORAL | Status: DC | PRN

## 2015-09-07 MED ORDER — SODIUM CHLORIDE 0.9% FLUSH: 10.0000 mL | INTRAVENOUS | Status: DC | PRN

## 2015-09-07 NOTE — Clinical Social Work Placement (Signed)
   CLINICAL SOCIAL WORK PLACEMENT  NOTE  Date:  09/07/2015  Patient Details  Name: Travis Waltman Sr. MRN: [id number] Date of Birth: 05-21-1951  Clinical Social Work is seeking post-discharge placement for this patient at the [place] level of care (*CSW will initial, date and re-position this form in  chart as items are completed):  Yes   Patient/family provided with [place] Work Department's list of facilities offering this level of care within the geographic area requested by the patient (or if unable, by the patient's family).  Yes   Patient/family informed of their freedom to choose among providers that offer the needed level of care, that participate in Medicare, Medicaid or managed care program needed by the patient, have an available bed and are willing to accept the patient.  Yes   Patient/family informed of [place]'s ownership interest in [facility] and [facility], as well as of the fact that they are under no obligation to receive care at these facilities.  PASRR submitted to EDS on 09/04/15     PASRR number received on 09/04/15     Existing PASRR number confirmed on 09/04/15     FL2 transmitted to all facilities in geographic area requested by pt/family on 09/04/15     FL2 transmitted to all facilities within larger geographic area on       Patient informed that his/her managed care company has contracts with or will negotiate with certain facilities, including the following:        Yes   Patient/family informed of bed offers received.  Patient chooses bed at [facility]     Physician recommends and patient chooses bed at      Patient to be transferred to [place] on 09/07/15.  Patient to be transferred to facility by PTAR     Patient family notified on 09/07/15 of transfer.  Name of family member notified:  Patient     PHYSICIAN Please sign FL2     Additional Comment:     _______________________________________________ Caroline Sauger, LCSW 09/07/2015, 12:57 PM

## 2015-09-07 NOTE — Patient Outreach (Signed)
Referral received for Diabetes and Care Management support post hospital.  Patient is currently discharging to a skilled nursing facility.  Patient signed consent form for care management services. Patient states he has minmal support when returning home and he is currently non-weight bearing and may need assistance with returning home.  He states that he does not know how long his rehab is planned for.  [facility] Care Management will follow up.  Consent form signed.  [facility] Care Management does not interfere with or repalce any services needed for this patient. [name], RN BSN [facility] Liaison  [phone] business mobile phone Toll free office [phone]

## 2015-09-07 NOTE — Progress Notes (Signed)
Reviewed discharge instructions with patient. Patient being discharged to [facility]. Called report to [facility].  Ilene Qua 3:49 PM 09/07/2015

## 2015-09-07 NOTE — Discharge Summary (Signed)
Physician Discharge Summary  Patient ID: Travis Olden Sr. MRN: [id number] DOB/AGE: 1950/06/18 65 y.o.  Admit date: 09/02/2015 Discharge date: 09/07/2015  Admission Diagnoses: Open fracture dislocation right ankle  Discharge Diagnoses:  Active Problems:   Open ankle fracture   Fracture dislocation of ankle joint   Discharged Condition: stable  Hospital Course: Patient's hospital course was essentially unremarkable. He underwent open reduction internal fixation placement of wound VAC for open wound. Patient progressed slowly with therapy require discharge to skilled nursing. Patient's wound VAC was removed prior to discharge the open wound tissue had healed and patient was discharged with dry dressing changes.  Consults: None  Significant Diagnostic Studies: labs: Routine labs  Treatments: surgery: See operative note  Discharge Exam: Blood pressure 130/54, pulse 67, temperature 98.4 F (36.9 C), temperature source Oral, resp. rate 16, height 6' 1"  (1.854 m), weight 147.419 kg (325 lb), SpO2 100 %. Incision/Wound: Incision clean and dry  Disposition: 01-Home or Self Care     Medication List    ASK your doctor about these medications        ARIPiprazole 5 MG tablet  Commonly known as:  ABILIFY  Take 1 tablet (5 mg total) by mouth at bedtime.     aspirin 81 MG tablet  Take 1 tablet (81 mg total) by mouth daily. Resume in 1 week after FU with PCP     atorvastatin 20 MG tablet  Commonly known as:  LIPITOR  TAKE 1 TABLET BY MOUTH EVERY DAY     clonazePAM 1 MG tablet  Commonly known as:  KLONOPIN  TAKE 1 TABLET BY MOUTH AT BEDTIME AS NEEDED FOR ANXIETY     cyclobenzaprine 10 MG tablet  Commonly known as:  FLEXERIL  Take 1 tablet by mouth 2 (two) times daily as needed for muscle spasms.     Fish Oil 1000 MG Caps  Take 1,000 mg by mouth daily.     FLUoxetine 40 MG capsule  Commonly known as:  PROZAC  Take 1 capsule (40 mg total) by mouth daily.     fluticasone  50 MCG/ACT nasal spray  Commonly known as:  FLONASE  Place 1 spray into both nostrils daily as needed (congestion).     glucose blood test strip  Commonly known as:  TRUE METRIX BLOOD GLUCOSE TEST  1 each by Other route 2 (two) times daily. Use to check blood sugars twice a day Dx E11.9     LYRICA 100 MG capsule  Generic drug:  pregabalin  Take 1 capsule by mouth 3 (three) times daily.     metFORMIN 500 MG tablet  Commonly known as:  GLUCOPHAGE  Take 1 tablet (500 mg total) by mouth 2 (two) times daily with a meal.     OPANA ER (CRUSH RESISTANT) 30 MG T12a  Generic drug:  Oxymorphone HCl (Crush Resist)  Take 1 tablet by mouth every 12 (twelve) hours as needed.     oxycodone 30 MG immediate release tablet  Commonly known as:  ROXICODONE  Take 1 tablet by mouth 5 (five) times daily.     pantoprazole 40 MG tablet  Commonly known as:  PROTONIX  Take 1 tablet (40 mg total) by mouth daily.     prasugrel 5 MG Tabs tablet  Commonly known as:  EFFIENT  Take 1 tablet (5 mg total) by mouth daily.     sildenafil 50 MG tablet  Commonly known as:  VIAGRA  Take 1 tablet (50 mg total) by mouth daily  as needed for erectile dysfunction.     tamsulosin 0.4 MG Caps capsule  Commonly known as:  FLOMAX  Take 0.4 mg by mouth daily after breakfast.     traZODone 100 MG tablet  Commonly known as:  DESYREL  Take 1 tablet (100 mg total) by mouth at bedtime.           Follow-up Information    Follow up with Travis Clowers V, MD In 1 week.   Specialty:  Orthopedic Surgery   Contact information:   [place] [address] [phone]       Signed: Newt [name] 09/07/2015, 11:59 AM

## 2015-09-07 NOTE — Clinical Social Work Note (Signed)
Patient to be discharged to [facility]. Patient updated regarding discharge. Patient to be transported via [place].  RN report number: [place], [place] [facility]: [phone] Surgical: [phone]

## 2015-09-07 NOTE — Telephone Encounter (Signed)
Nurse from [facility] called to get verification of medication for this patient so they can order his medication need it before 5 pm
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
## 2015-09-08 ENCOUNTER — Other Ambulatory Visit: Payer: Self-pay | Admitting: *Deleted

## 2015-09-08 ENCOUNTER — Encounter: Payer: Self-pay | Admitting: Nurse Practitioner

## 2015-09-08 ENCOUNTER — Non-Acute Institutional Stay (SKILLED_NURSING_FACILITY): Payer: Medicare PPO | Admitting: Nurse Practitioner

## 2015-09-08 ENCOUNTER — Other Ambulatory Visit (HOSPITAL_COMMUNITY): Payer: Self-pay | Admitting: Psychiatry

## 2015-09-08 DIAGNOSIS — IMO0002 Reserved for concepts with insufficient information to code with codable children: Secondary | ICD-10-CM

## 2015-09-08 DIAGNOSIS — I10 Essential (primary) hypertension: Secondary | ICD-10-CM

## 2015-09-08 DIAGNOSIS — G894 Chronic pain syndrome: Secondary | ICD-10-CM | POA: Diagnosis not present

## 2015-09-08 DIAGNOSIS — K219 Gastro-esophageal reflux disease without esophagitis: Secondary | ICD-10-CM | POA: Insufficient documentation

## 2015-09-08 DIAGNOSIS — E1151 Type 2 diabetes mellitus with diabetic peripheral angiopathy without gangrene: Secondary | ICD-10-CM

## 2015-09-08 DIAGNOSIS — R338 Other retention of urine: Secondary | ICD-10-CM

## 2015-09-08 DIAGNOSIS — F419 Anxiety disorder, unspecified: Secondary | ICD-10-CM | POA: Diagnosis not present

## 2015-09-08 DIAGNOSIS — D62 Acute posthemorrhagic anemia: Secondary | ICD-10-CM | POA: Diagnosis not present

## 2015-09-08 DIAGNOSIS — E1165 Type 2 diabetes mellitus with hyperglycemia: Secondary | ICD-10-CM

## 2015-09-08 DIAGNOSIS — F319 Bipolar disorder, unspecified: Secondary | ICD-10-CM

## 2015-09-08 DIAGNOSIS — N179 Acute kidney failure, unspecified: Secondary | ICD-10-CM

## 2015-09-08 HISTORY — DX: Gastro-esophageal reflux disease without esophagitis: K21.9

## 2015-09-08 MED ORDER — ARIPIPRAZOLE 5 MG PO TABS: 5.0000 mg | ORAL_TABLET | Freq: Every day | ORAL | Status: DC

## 2015-09-08 MED ORDER — LYRICA 100 MG PO CAPS: 100.0000 mg | ORAL_CAPSULE | Freq: Three times a day (TID) | ORAL | Status: DC

## 2015-09-08 MED ORDER — OXYCODONE HCL 30 MG PO TABS: 30.0000 mg | ORAL_TABLET | Freq: Every day | ORAL | Status: DC

## 2015-09-08 MED ORDER — OPANA ER 30 MG PO T12A: 1.0000 | EXTENDED_RELEASE_TABLET | Freq: Two times a day (BID) | ORAL | Status: DC | PRN

## 2015-09-08 MED ORDER — CLONAZEPAM 1 MG PO TABS: ORAL_TABLET | ORAL | Status: DC

## 2015-09-08 NOTE — Assessment & Plan Note (Signed)
Stable, 09/02/15 Na 136, K 4.4, Bun 14, creat 0.87

## 2015-09-08 NOTE — Assessment & Plan Note (Signed)
Takes metformin 538m bid, update Hgb a1c, CMP, TSH
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
## 2015-09-08 NOTE — Assessment & Plan Note (Signed)
Pain is managed with Flexeril 40m bid prn, Lyrica 1083mtid, Oxycodone 3070mx/day, prn Opana

## 2015-09-08 NOTE — Assessment & Plan Note (Signed)
Controlled, no on meds.

## 2015-09-08 NOTE — Telephone Encounter (Signed)
This was completed

## 2015-09-08 NOTE — Assessment & Plan Note (Signed)
No urinary retention presently, continue Flomax 0.3m daily.

## 2015-09-08 NOTE — Progress Notes (Signed)
Patient ID: Travis Speir Sr., male   DOB: Sep 26, 1950, 65 y.o.   MRN: [id number]  Location:  [facility] Room Number: [place] of Service:  SNF (31) Provider: Lennie Odor Bharath Bernstein NP  GREEN, Viviann Spare, MD  Patient Care Team: Estill Dooms, MD as PCP - General (Internal Medicine) Francis Gaines, LCSW as [place], RN as [place] Management [name], MD as Attending Physician (Internal Medicine) Newt Minion, MD as Consulting Physician (Orthopedic Surgery) Greer Pickerel, MD as Consulting Physician (General Surgery) Larey Dresser, MD as Consulting Physician (Cardiology) Jerene Bears, MD as Consulting Physician (Gastroenterology) Jessy Oto, MD as Consulting Physician (Orthopedic Surgery) Ludwig Clarks, DO as Consulting Physician (Neurology) Kathlee Nations, MD as Consulting Physician (Psychiatry)  Extended Emergency Contact Information Primary Emergency Contact: Garofano,[name] Address: [address]          [place], [place] [address] [place] Phone: [phone] Mobile Phone: [phone] Relation: Brother Secondary Emergency Contact: [name] Address: [address]           [place], [place] [address] [place] Mobile Phone: [phone] Relation: Relative  Code Status:  DNR Goals of care: Advanced Directive information Advanced Directives 09/15/2015  Does patient have an advance directive? No  Would patient like information on creating an advanced directive? No - patient declined information     Chief Complaint  Patient presents with  . Medical Management of Chronic Issues    New admit     HPI:  Pt is a 65 y.o. male seen today for medical management of chronic diseases.  Hospitalized from 09/02/15 to 09/07/15 for ORIF of fractured right ankle. Patient's wound VAC was removed prior to discharge the open wound tissue had healed and  patient was discharged with dry dressing changes.   Hx anemia, 09/02/15 Hgb 11.8, no urinary retention while on Flomax 0.67m daily, AKI last  Bun/creat 14/0.87, anxiety, controlled on Prozac 467m Clonazepam prn, Trazodone 10050mhs, Abilify 5mg42mhronic pain is managed with Lyrica, Oxycodone, and Flexeril. Blood sugar is managed with Metformin 500mg40m. Takes Protonix for GERD. Blood pressure is controlled w/o meds.     Past Medical History  Diagnosis Date  . HYPERLIPIDEMIA-MIXED 07/15/2009  . Chronic pain syndrome 10/27/2009    of ankle, shoulders, low back.  sciatica.   . HYPMarland KitchenRTENSION 06/24/2009  . CORONARY ARTERY DISEASE 06/24/2009    a. s/p multiple PCIs - In 2008 he had a Taxus DES to the mild LAD, Endeavor DES to mid LCX and distal LCX. In January 2009 he had DES to distal LCX, mid LCX and proximal LCX. In November 2009 had BMS x 2 to the mid RCA. Cath 10/2011 with patent stents, noncardiac CP. LHC 01/2013: patent stents (noncardiac CP).  . UREMarland KitchenHRAL STRICTURE 06/24/2009    self catheterizes.   . NEPMarland KitchenROLITHIASIS, HX OF 06/24/2009  . ERECTILE DYSFUNCTION, ORGANIC 05/30/2010  . Obesity   . ALLERGIC RHINITIS 06/24/2009  . Depression with anxiety     Prior suicide attempt  . Irregular heart beat   . Pneumonia   . Non-cardiac chest pain 10/2011, 01/2013  . Type II diabetes mellitus (HCC) [place]2    no meds in 09/2014.   . DISMarland Kitchen Midway SouthASE, LUMBAR 04/19/2010  . Arthritis     "all my joints" (09/30/2013)  . Fibromyalgia   . Myocardial infarction (HCC) [place]8  . Heart murmur   .  OSA (obstructive sleep apnea)     not using CPAP (09/30/2013)  . Depression   . History of kidney stones   . CAD (coronary artery disease) 06/24/2009    5 stents placed in 2007    . Chronic anticoagulation   . Closed fracture of right foot 10/17/2014  . Essential hypertension 06/24/2009    Qualifier: Diagnosis of  By: [name], [name]   . GERD (gastroesophageal reflux disease) 09/08/2015  . Hyperlipidemia 07/15/2009    Qualifier:  Diagnosis of  By: Aundra Dubin, MD, [place]    . Major depression ([place]) 09/13/2015  . Open ankle fracture 09/02/2015  . Rotator cuff tear arthropathy of both shoulders 06/10/2013    History of bilateral shoulder cuff surgery for rotator cuff tears. Reports increase in pain 09/11/2015 during physical therapy of the left shoulder.   . S/P TKR (total knee replacement) bilaterally 09/13/2015    2007   . Spinal stenosis in cervical region 09/26/2013  . Spinal stenosis, lumbar region, with neurogenic claudication 09/26/2013  . Uncontrolled type 2 DM with peripheral circulatory disorder ([place]) 10/04/2013  . Allergic rhinitis 06/24/2009    Qualifier: Diagnosis of  By: [name], [name]   . Anxiety 11/12/2011    Adequate for discharge   . Arthritis of foot, right, degenerative 04/15/2014  . Balance disorder 03/12/2013  . Benign neoplasm of cecum   . Benign neoplasm of descending colon   . DEGENERATIVE JOINT DISEASE 06/24/2009    Qualifier: Diagnosis of  By: [name], [name]   . Fracture dislocation of ankle joint 09/02/2015  . Gait disorder 03/12/2013  . General weakness 07/14/2014  . Hand joint pain 06/10/2013  . Insomnia 10/04/2011  . Left hip pain 03/12/2013    Injected under ultrasound guidance on June 24, 2013   . Occult blood positive stool 10/17/2014  . Pain of right thumb 04/03/2013  . PPD positive 04/08/2015  . Pre-ulcerative corn or callous 02/06/2013  . S/P laparoscopic hernia repair 05/11/2015  . SCIATICA, LEFT 04/19/2010    Qualifier: Diagnosis of  By: [name], [name]    Past Surgical History  Procedure Laterality Date  . Shoulder open rotator cuff repair Right X 2  . Total knee arthroplasty Bilateral 2008  . Carpal tunnel release Bilateral   . Urethral dilation  X 4  . Knee arthroscopy Right X 7  . Umbilical hernia repair      UHR  . Hernia repair      umbilical  . Shoulder arthroscopy w/ rotator cuff repair Bilateral     "3on the left; 2 on the right"  . Tonsillectomy    . Knee  cartilage surgery Right X 4    "open; not scopes"  . Knee cartilage surgery Left X 3    "open; not scopes"  . Coronary angioplasty with stent placement      "I have 9 stents"  . Cardiac catheterization  X 1  . Anterior cervical decomp/discectomy fusion N/A 09/26/2013    Procedure: ANTERIOR CERVICAL DISCECTOMY FUSION C3-4, plate and screw fixation, allograft bone graft;  Surgeon: Jessy Oto, MD;  Location: [facility];  Service: Orthopedics;  Laterality: N/A;  . Laminectomy  01/28/2014    L3 L4 L5  . Lumbar laminectomy/decompression microdiscectomy N/A 01/27/2014    Procedure: CENTRAL LUMBAR LAMINECTOMY L4-5 AND L3-4;  Surgeon: Jessy Oto, MD;  Location: [facility];  Service: Orthopedics;  Laterality: N/A;  . Joint replacement Bilateral     knees  .  Colonoscopy    . Vasectomy    . Fusion of talonavicular joint Right 04/15/2014    dr duda  . Ankle fusion Right 04/15/2014    Procedure: Right Subtalar, Talonavicular Fusion;  Surgeon: Newt Minion, MD;  Location: [facility];  Service: Orthopedics;  Laterality: Right;  . Left heart catheterization with coronary angiogram N/A 02/10/2013    Procedure: LEFT HEART CATHETERIZATION WITH CORONARY ANGIOGRAM;  Surgeon: Burnell Blanks, MD;  Location[facility] CATH LAB;  Service: Cardiovascular;  Laterality: N/A;  . Esophagogastroduodenoscopy N/A 10/19/2014    Procedure: ESOPHAGOGASTRODUODENOSCOPY (EGD);  Surgeon: Jerene Bears, MD;  Location[facility] ENDOSCOPY;  Service: Endoscopy;  Laterality: N/A;  . Colonoscopy N/A 10/22/2014    Procedure: COLONOSCOPY;  Surgeon: Lafayette Dragon, MD;  Location: [facility] ENDOSCOPY;  Service: Endoscopy;  Laterality: N/A;  . Inguinal hernia repair  05/11/2015  . Inguinal hernia repair Right 05/11/2015    Procedure: LAPAROSCOPIC REPAIR RIGHT  INGUINAL HERNIA;  Surgeon: Greer Pickerel, MD;  Location: [facility];  Service: General;  Laterality: Right;  . Insertion of mesh Right 05/11/2015    Procedure: INSERTION OF MESH;  Surgeon: Greer Pickerel, MD;  Location:  [facility];  Service: General;  Laterality: Right;  . Orif ankle fracture Right 09/02/2015  . Peripherally inserted central catheter insertion  09/02/2015  . Orif ankle fracture Right 09/02/2015    Procedure: OPEN REDUCTION INTERNAL FIXATION (ORIF) ANKLE FRACTURE;  Surgeon: Newt Minion, MD;  Location: [facility];  Service: Orthopedics;  Laterality: Right;    No Known Allergies    Medication List       This list is accurate as of: 09/08/15 11:59 PM.  Always use your most recent med list.               ARIPiprazole 5 MG tablet  Commonly known as:  ABILIFY  Take 1 tablet (5 mg total) by mouth at bedtime.     atorvastatin 20 MG tablet  Commonly known as:  LIPITOR  TAKE 1 TABLET BY MOUTH EVERY DAY     clonazePAM 1 MG tablet  Commonly known as:  KLONOPIN  TAKE 1 TABLET BY MOUTH AT BEDTIME AS NEEDED FOR ANXIETY     cyclobenzaprine 10 MG tablet  Commonly known as:  FLEXERIL  Take 1 tablet by mouth 2 (two) times daily as needed for muscle spasms.     Fish Oil 1000 MG Caps  Take 1,000 mg by mouth daily.     FLUoxetine 40 MG capsule  Commonly known as:  PROZAC  Take 1 capsule (40 mg total) by mouth daily.     fluticasone 50 MCG/ACT nasal spray  Commonly known as:  FLONASE  Place 1 spray into both nostrils daily as needed (congestion).     LYRICA 100 MG capsule  Generic drug:  pregabalin  Take 1 capsule (100 mg total) by mouth 3 (three) times daily.     metFORMIN 500 MG tablet  Commonly known as:  GLUCOPHAGE  Take 1 tablet (500 mg total) by mouth 2 (two) times daily with a meal.     OPANA ER (CRUSH RESISTANT) 30 MG T12a  Generic drug:  Oxymorphone HCl (Crush Resist)  Take 1 tablet by mouth every 12 (twelve) hours as needed.     oxycodone 30 MG immediate release tablet  Commonly known as:  ROXICODONE  Take 1 tablet (30 mg total) by mouth 5 (five) times daily.     pantoprazole 40 MG tablet  Commonly known as:  PROTONIX  Take 1 tablet (40 mg total) by mouth daily.      prasugrel 5 MG Tabs tablet  Commonly known as:  EFFIENT  Take 1 tablet (5 mg total) by mouth daily.     tamsulosin 0.4 MG Caps capsule  Commonly known as:  FLOMAX  Take 0.4 mg by mouth daily after breakfast.     traZODone 100 MG tablet  Commonly known as:  DESYREL  Take 1 tablet (100 mg total) by mouth at bedtime.     traZODone 100 MG tablet  Commonly known as:  DESYREL  TAKE 1 TABLET(100 MG) BY MOUTH AT BEDTIME        Review of Systems  Constitutional: Positive for activity change. Negative for fever, chills, appetite change, fatigue and unexpected weight change.  HENT: Negative for congestion, dental problem, drooling, ear discharge, ear pain, facial swelling, hearing loss, mouth sores and nosebleeds.   Eyes: Negative for pain, discharge, redness and itching.  Respiratory: Negative for apnea, choking, chest tightness, shortness of breath and wheezing.   Cardiovascular: Positive for leg swelling.       Right lower leg swelling.   Gastrointestinal: Negative for abdominal pain, constipation, blood in stool and abdominal distention.  Endocrine: Negative for cold intolerance and heat intolerance.  Genitourinary: Negative for dysuria, frequency, flank pain and difficulty urinating.  Musculoskeletal: Positive for myalgias, joint swelling and gait problem. Negative for back pain, arthralgias, neck pain and neck stiffness.  Skin: Negative for color change, pallor, rash and wound.  Neurological: Negative for dizziness, tremors, seizures, syncope, facial asymmetry, speech difficulty, weakness, light-headedness, numbness and headaches.  Hematological: Negative for adenopathy. Does not bruise/bleed easily.  Psychiatric/Behavioral: Negative for suicidal ideas, hallucinations, behavioral problems, confusion, sleep disturbance, dysphoric mood, decreased concentration and agitation. The patient is not nervous/anxious.     Immunization History  Administered Date(s) Administered  . Influenza  Split 06/05/2011, 06/26/2012  . Influenza Whole 03/14/2010  . Influenza,inj,Quad PF,36+ Mos 02/09/2013, 04/16/2014, 03/11/2015  . PPD Test 10/04/2011, 04/07/2015  . Pneumococcal Conjugate-13 04/02/2015  . Pneumococcal Polysaccharide-23 02/19/2009, 06/05/2011, 01/28/2014  . Td 05/22/2004  . Tdap 10/17/2014   Pertinent  Health Maintenance Due  Topic Date Due  . OPHTHALMOLOGY EXAM  05/28/1962  . HEMOGLOBIN A1C  09/10/2015  . INFLUENZA VACCINE  12/21/2015  . FOOT EXAM  03/10/2016  . URINE MICROALBUMIN  03/11/2016  . COLONOSCOPY  10/21/2024   Fall Risk  09/15/2015 07/14/2015 03/11/2015 09/09/2014 04/03/2013  Falls in the past year? Yes Yes Yes Yes Yes  Number falls in past yr: 2 or more 2 or more 1 2 or more 2 or more  Injury with Fall? Yes Yes Yes No -  Risk Factor Category  High Fall Risk High Fall Risk - High Fall Risk High Fall Risk  Risk for fall due to : History of fall(s);Impaired balance/gait;Impaired mobility Impaired balance/gait;History of fall(s);Medication side effect - - Impaired balance/gait  Follow up Education provided;Falls prevention discussed Falls evaluation completed;Falls prevention discussed;Education provided - - -   Functional Status Survey:    Filed Vitals:   09/08/15 1332  BP: 142/82  Pulse: 88  Temp: 99.1 F (37.3 C)  TempSrc: Oral  Resp: 16  Height: 6' 1"  (1.854 m)  Weight: 325 lb (147.419 kg)   Body mass index is 42.89 kg/(m^2). Physical Exam  Constitutional: He is oriented to person, place, and time. He appears well-developed and well-nourished. No distress.  HENT:  Head: Normocephalic and atraumatic.  Eyes: Conjunctivae and EOM are  normal. Pupils are equal, round, and reactive to light.  Neck: Normal range of motion. Neck supple. No JVD present. No tracheal deviation present. No thyromegaly present.  Cardiovascular: Normal rate, regular rhythm, normal heart sounds and intact distal pulses.   No murmur heard. Pulmonary/Chest: No respiratory  distress. He has no wheezes. He has no rales. He exhibits no tenderness.  Abdominal: He exhibits no distension. There is no tenderness. There is no rebound.  Musculoskeletal: He exhibits edema and tenderness.  Right lower leg edema 1+. Right ankle decreased ROM  Lymphadenopathy:    He has no cervical adenopathy.  Neurological: He is alert and oriented to person, place, and time. He has normal reflexes. He displays normal reflexes. No cranial nerve deficit. He exhibits normal muscle tone. Coordination normal.  Skin: Skin is warm and dry. No rash noted. He is not diaphoretic. No erythema.  Psychiatric: He has a normal mood and affect. His behavior is normal. Judgment and thought content normal.    Labs reviewed:  Recent Labs  05/10/15 1327 05/16/15 1534 05/16/15 1841 09/02/15 1949 09/02/15 1955  NA 140 136 140 136 136  K 4.3 5.5* 3.8 4.3 4.4  CL 105 101 99*  --  100*  CO2 25 28  --   --  24  GLUCOSE 109* 121* 95 111* 109*  BUN 21* 24* 24*  --  14  CREATININE 0.89 1.02 0.90  --  0.87  CALCIUM 9.0 8.9  --   --  8.8*    Recent Labs  03/12/15 1240 04/08/15 1240 09/02/15 1955  AST 18 15 40  ALT 12 16 18   ALKPHOS 72 72 59  BILITOT 0.5 0.3 0.9  PROT 6.4 7.4 6.6  ALBUMIN 4.0 4.0 3.8    Recent Labs  04/08/15 1240 05/10/15 1327 05/12/15 0600 05/16/15 1534 05/16/15 1841 09/02/15 1949 09/02/15 1955  WBC 6.9 4.0 4.6 5.3  --   --  4.6  NEUTROABS 4.1 2.2  --  3.0  --   --   --   HGB 12.5* 11.7* 11.7* 10.7* 10.2* 12.2* 11.8*  HCT 37.3* 35.1* 34.9* 31.4* 30.0* 36.0* 36.0*  MCV 89.0 88.9 89.9 89.5  --   --  90.9  PLT 381.0 200 204 374  --   --  94*   Lab Results  Component Value Date   TSH 1.80 03/12/2015   Lab Results  Component Value Date   HGBA1C 5.4 03/12/2015   Lab Results  Component Value Date   CHOL 120 03/12/2015   HDL 52.70 03/12/2015   LDLCALC 51 03/12/2015   TRIG 83.0 03/12/2015   CHOLHDL 2 03/12/2015    Significant Diagnostic Results in last 30  days:  X-ray Chest Pa Or Ap  09/02/2015  CLINICAL DATA:  Central catheter placement EXAM: CHEST 1 VIEW COMPARISON:  April 08, 2015 FINDINGS: Left jugular catheter extends laterally with the tip in the left subclavian vein near the junction with the left axillary vein. No pneumothorax. Lungs clear. Heart is upper normal in size with pulmonary vascular within normal limits. No adenopathy. No bone lesions. There is postoperative change in the lower cervical spine. IMPRESSION: Left jugular catheter tip is near the junction of the left subclavian and axillary veins. No pneumothorax. Lungs clear. Heart upper normal in size. These results will be called to the ordering clinician or representative by the Radiologist Assistant, and communication documented in the PACS or zVision Dashboard. Electronically Signed   By: Lowella Grip III M.D.  On: 09/02/2015 22:38   Mr Lumbar Spine W Wo Contrast  08/30/2015  CLINICAL DATA:  Back pain for 6 months.  No injury. EXAM: MRI LUMBAR SPINE WITHOUT AND WITH CONTRAST TECHNIQUE: Multiplanar and multiecho pulse sequences of the lumbar spine were obtained without and with intravenous contrast. CONTRAST:  65m MULTIHANCE GADOBENATE DIMEGLUMINE 529 MG/ML IV SOLN [place] [address] Results: Creatinine 0.8 mg/dL. COMPARISON:  MRI lumbar spine 03/17/2014.  Plain films 04/23/2014. FINDINGS: Segmentation: Normal. Alignment: Trace retrolisthesis L2-3 is facet mediated. Similar trace anterolisthesis L4-5 postsurgical and facet mediated. Vertebrae: No worrisome osseous lesion. Conus medullaris: Normal in size, signal, and location. Paraspinal tissues: No evidence for hydronephrosis or paravertebral mass. Disc levels: L1-L2: Central and rightward protrusion. Osseous spurring to the RIGHT. Mild facet arthropathy. RIGHT L1 and RIGHT L2 nerve root impingement are possible. L2-L3: Disc space narrowing. Disc osteophyte formation with  annular bulging. Prominent Schmorl's node extends downward to the LEFT. Posterior element hypertrophy. Slight subarticular zone narrowing is worse on the LEFT. LEFT L3 nerve root impingement is possible. L3-L4: Mild bulge. Moderate posterior element hypertrophy. Mild stenosis. Either L4 nerve root could be affected. L4-L5: Central and leftward protrusion extends to the foramen. An additional component extends into the subarticular zone and foraminal zone on the RIGHT. Asymmetric facet arthropathy worse on the LEFT. Adequate posterior decompression. BILATERAL L4 and L5 nerve root impingement are likely. L5-S1: Severe disc space narrowing. Mild facet arthropathy. No impingement. Post infusion, no worrisome postcontrast enhancement. Peridiscal enhancement is most notable at L4-5 on the RIGHT. Compared with priors, the previously noted fluid collection has resolved. IMPRESSION: Postsurgical changes at L4-5, with residual/recurrent disc material narrowing the subarticular zone and foraminal zone bilaterally. L4 and L5 nerve root impingement likely. Severe disc space narrowing L5-S1 is stable. No definite impingement this level. Asymmetric spondylosis at L2-3 on the LEFT at L1-2 on the RIGHT could affect the subarticular and foraminal nerve roots at those levels, respectively. Mild stenosis at L3-4 is multifactorial. Annular bulging combined with posterior element hypertrophy could affect the exiting L4 nerve roots. Electronically Signed   By: JStaci RighterM.D.   On: 08/30/2015 15:44    Assessment/Plan  Acute blood loss anemia 09/02/15 Hgb 11.8 Update CBC  Acute urinary retention No urinary retention presently, continue Flomax 0.428mdaily.   AKI (acute kidney injury) Stable, 09/02/15 Na 136, K 4.4, Bun 14, creat 0.87  Anxiety Mood is stable, continue Prozac 4016mClonazepam 1mg39m prn, Trazodone 100mg4mly, Abilify 5mg d36my.   Chronic pain syndrome Pain is managed with Flexeril 10mg b12mrn, Lyrica  100mg ti109mxycodone 30mg 5x/88m prn Opana  Uncontrolled type 2 DM with peripheral circulatory disorder (HCC) Takes metformin 500mg bid,17mate Hgb a1c, CMP, TSH  GERD (gastroesophageal reflux disease) Stable, continue Protonix 40mg daily19mEssential hypertension Controlled, no on meds.     Family/ staff Communication: here for rehab, goal is to return home when able.   Labs/tests ordered: CBC, CMP, TSH, Hgb a1c

## 2015-09-08 NOTE — Assessment & Plan Note (Signed)
Mood is stable, continue Prozac 36m, Clonazepam 17mhs prn, Trazodone 10058maily, Abilify 5mg22mily.

## 2015-09-08 NOTE — Assessment & Plan Note (Signed)
09/02/15 Hgb 11.8 Update CBC

## 2015-09-08 NOTE — Assessment & Plan Note (Signed)
Stable, continue Protonix 5m daily.

## 2015-09-09 ENCOUNTER — Ambulatory Visit (HOSPITAL_COMMUNITY): Payer: Self-pay | Admitting: Psychiatry

## 2015-09-13 ENCOUNTER — Non-Acute Institutional Stay (SKILLED_NURSING_FACILITY): Payer: Medicare PPO | Admitting: Internal Medicine

## 2015-09-13 ENCOUNTER — Encounter: Payer: Self-pay | Admitting: Internal Medicine

## 2015-09-13 DIAGNOSIS — E669 Obesity, unspecified: Secondary | ICD-10-CM

## 2015-09-13 DIAGNOSIS — G894 Chronic pain syndrome: Secondary | ICD-10-CM | POA: Diagnosis not present

## 2015-09-13 DIAGNOSIS — M75101 Unspecified rotator cuff tear or rupture of right shoulder, not specified as traumatic: Secondary | ICD-10-CM

## 2015-09-13 DIAGNOSIS — M12811 Other specific arthropathies, not elsewhere classified, right shoulder: Secondary | ICD-10-CM

## 2015-09-13 DIAGNOSIS — Z96659 Presence of unspecified artificial knee joint: Secondary | ICD-10-CM | POA: Insufficient documentation

## 2015-09-13 DIAGNOSIS — E1165 Type 2 diabetes mellitus with hyperglycemia: Secondary | ICD-10-CM

## 2015-09-13 DIAGNOSIS — F3341 Major depressive disorder, recurrent, in partial remission: Secondary | ICD-10-CM | POA: Diagnosis not present

## 2015-09-13 DIAGNOSIS — Z96653 Presence of artificial knee joint, bilateral: Secondary | ICD-10-CM

## 2015-09-13 DIAGNOSIS — S82891B Other fracture of right lower leg, initial encounter for open fracture type I or II: Secondary | ICD-10-CM | POA: Diagnosis not present

## 2015-09-13 DIAGNOSIS — Z7901 Long term (current) use of anticoagulants: Secondary | ICD-10-CM | POA: Diagnosis not present

## 2015-09-13 DIAGNOSIS — I2510 Atherosclerotic heart disease of native coronary artery without angina pectoris: Secondary | ICD-10-CM

## 2015-09-13 DIAGNOSIS — M12812 Other specific arthropathies, not elsewhere classified, left shoulder: Secondary | ICD-10-CM

## 2015-09-13 DIAGNOSIS — K219 Gastro-esophageal reflux disease without esophagitis: Secondary | ICD-10-CM

## 2015-09-13 DIAGNOSIS — M75102 Unspecified rotator cuff tear or rupture of left shoulder, not specified as traumatic: Secondary | ICD-10-CM

## 2015-09-13 DIAGNOSIS — D62 Acute posthemorrhagic anemia: Secondary | ICD-10-CM

## 2015-09-13 DIAGNOSIS — F329 Major depressive disorder, single episode, unspecified: Secondary | ICD-10-CM | POA: Insufficient documentation

## 2015-09-13 DIAGNOSIS — E1151 Type 2 diabetes mellitus with diabetic peripheral angiopathy without gangrene: Secondary | ICD-10-CM | POA: Diagnosis not present

## 2015-09-13 DIAGNOSIS — IMO0002 Reserved for concepts with insufficient information to code with codable children: Secondary | ICD-10-CM

## 2015-09-13 DIAGNOSIS — E785 Hyperlipidemia, unspecified: Secondary | ICD-10-CM

## 2015-09-13 DIAGNOSIS — I10 Essential (primary) hypertension: Secondary | ICD-10-CM | POA: Diagnosis not present

## 2015-09-13 HISTORY — DX: Major depressive disorder, single episode, unspecified: F32.9
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
## 2015-09-15 ENCOUNTER — Encounter: Payer: Self-pay | Admitting: *Deleted

## 2015-09-15 ENCOUNTER — Other Ambulatory Visit: Payer: Self-pay | Admitting: *Deleted
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
## 2015-09-27 ENCOUNTER — Other Ambulatory Visit (HOSPITAL_COMMUNITY): Payer: Self-pay | Admitting: Psychiatry

## 2015-09-29 ENCOUNTER — Other Ambulatory Visit: Payer: Self-pay | Admitting: *Deleted

## 2015-09-29 NOTE — Patient Outreach (Signed)
[facility]) Care Management  09/29/2015  Travis Calandra Sr. 11-11-1950 [id number]   CSW was able to make contact with patient today to follow-up regarding short-term placement at [facility] [place] where patient currently resides to receive short-term rehabilitative services. Patient is optimistic that he will be able to return home today, after scheduled Discharge Planning Meeting with the Attending Physician at [facility], Dr. Jeanmarie Hubert, scheduled for 1:30pm.  Patient admitted that he has been working hard with therapies (both physical and occupational) and that he has been putting weight on his ankle, despite discouragement otherwise.  Patient stated, "The doctor said I would be able to leave as soon as I can put weight on it, so I've been putting weight on it, despite their knowledge, because I'm ready to go". Home health arrangements have already been made for patient and durable medical equipment has already been ordered and delivered to the home.  Patient will be living with his niece for an extended period of time, until he is able to find a place of his own and his health is more stable.  Patient stated, "My bags are already packed and my ride is on standby".  Patient indicated that he has already worked with physical therapy today and that he was "given the green light".  No additional social work needs have been identified at this time. CSW will perform a case closure on patient, as all goals of treatment have been met from social work standpoint and no additional social work needs have been identified at this time. CSW will notify patient's RNCM with [place] [facility], [name] of CSW's plans to close patient's case. CSW will fax a correspondence letter to patient's Primary Care Physician, Dr. Jeanmarie Hubert to ensure that Dr. Nyoka Cowden is aware of CSW's case closure plans.   CSW will submit a case closure request to Lurline Del,  Care Management Assistant with [facility], in the form of an [employer].  CSW will ensure that [name] is aware of [name], RNCM with [facility] [facility], continued involvement with patient's care. [name], BSW, MSW, LCSW  Licensed Education officer, environmental Health System  Mailing [place] [address] [place], [place] [address] Physical Address-[address], [place], [place] [address] Toll Free Main # [phone] Fax # [phone] Cell # [phone]  Fax # [phone]  [name] Patient's preferred language:  [place]   English  ATTENTION:  If you speak [place], language assistance services, free of charge, are available to you.    Nondiscrimination and Accessibility Statement: Discrimination is Against the [employer], a subsidiary of [employer], complies with [employer] civil rights laws and does not discriminate on the basis of race, color, national origin, age, disability, or sex.  [place] does not exclude people or treat them differently because of race, color, national origin, age, disability, or sex.  [place] Providers will:  . Provide free aids and services to people with disabilities to communicate effectively with [place], such as:     ? Qualified sign language interpreters  ? Written information in other formats (large print, audio, accessible electronic formats, other formats)   . Provide free language services to people whose primary language is not [place], such as:    ? Qualified interpreters    ? Information written in other languages   If you need these services, contact  your [facility] Fore[facility] psychologist.  If you believe that a [employer] has failed to provide these services or discriminated in another way on the basis of  race, color, national origin, age, disability, or sex, you can file a Tourist information centre manager with: [place], [phone] or http://chapman.info/.  You can file a grievance in person or by mail, fax, or email. If you need help filing a grievance, you may contact Valrie [place], Interim Compliance Officer, [facility] Department of Compliance and Integrity, [place] 2nd Floor, [place], [place]. [place], [phone], [email or web address].    You can also file a civil rights complaint with the U.S. Department of Health and Financial controller, Office for [employer], electronically through the Office for Civil Rights Complaint Portal, available at OnSiteLending.nl.jsf, or by mail or phone at:  [place]. Department of Health and Human Services [address] [place] Room [phone], [facility] [place], [place]  [phone], [phone] (TDD) Complaint forms are available at CutFunds.[email or web address].

## 2015-09-30 ENCOUNTER — Encounter (HOSPITAL_COMMUNITY): Payer: Self-pay | Admitting: *Deleted

## 2015-09-30 ENCOUNTER — Other Ambulatory Visit (HOSPITAL_COMMUNITY): Payer: Self-pay | Admitting: Family

## 2015-09-30 MED ORDER — DEXTROSE 5 % IV SOLN
3.0000 g | INTRAVENOUS | Status: AC
  Administered 2015-10-01: 3 g via INTRAVENOUS
  Filled 2015-09-30: qty 3000

## 2015-09-30 NOTE — Pre-Procedure Instructions (Signed)
    Travis Binford Sr.  09/30/2015     Travis [name] procedure is scheduled on Friday, Oct 01, 2015 at 4:20 PM.   Report to [facility] Entrance "A" Admitting Office at 1:45 PM.   Call this number if you have problems the morning of surgery: [phone]    Remember:  Do not eat food or drink liquids after midnight tonight  Travis [name] may take these medicines the morning of surgery with A SIP OF WATER: Fluoxetine (Prozac), Lyrica, Pantoprazole (Protonix), Tamsulosin (Flomax), Oxycodone, Flonase nasal spray - if needed  No diabetic medications in the AM.   Do not wear jewelry.  Do not wear lotions, powders, or cologne.  You may wear deodorant.  Men may shave face and neck.  Do not bring valuables to the hospital.  [facility] is not responsible for any belongings or valuables.  Patients discharged the day of surgery will not be allowed to drive home.   If any questions this afternoon, please call me, Lilia Pro, RN at [phone]

## 2015-09-30 NOTE — Progress Notes (Signed)
Spoke with pt for pre-op call. Did not realize that he was in a SNF. Pt denies any recent chest pain or sob. He has an extensive medical history. Cardiologist is Dr. Aundra Dubin and sees pt on a yearly basis, last visit Aug. 2016.  Called to speak with nurse at [facility] but nurse was not available. I faxed pre-op instructions to the facility and I also gave the instructions to the pt. Pt will be arriving via transportation from nursing facility.

## 2015-10-01 ENCOUNTER — Ambulatory Visit (HOSPITAL_COMMUNITY)
Admission: RE | Admit: 2015-10-01 | Discharge: 2015-10-01 | Disposition: A | Discharge status: Skilled Nursing Facility | Payer: Medicare PPO | Source: Ambulatory Visit | Attending: Orthopedic Surgery | Admitting: Orthopedic Surgery

## 2015-10-01 ENCOUNTER — Encounter (HOSPITAL_COMMUNITY)
Admission: RE | Disposition: A | Discharge status: Skilled Nursing Facility | Payer: Self-pay | Source: Ambulatory Visit | Attending: Orthopedic Surgery

## 2015-10-01 ENCOUNTER — Ambulatory Visit (HOSPITAL_COMMUNITY): Payer: Medicare PPO | Admitting: Critical Care Medicine

## 2015-10-01 ENCOUNTER — Other Ambulatory Visit: Payer: Self-pay | Admitting: *Deleted

## 2015-10-01 ENCOUNTER — Encounter (HOSPITAL_COMMUNITY): Payer: Self-pay | Admitting: *Deleted

## 2015-10-01 DIAGNOSIS — G4733 Obstructive sleep apnea (adult) (pediatric): Secondary | ICD-10-CM | POA: Insufficient documentation

## 2015-10-01 DIAGNOSIS — F418 Other specified anxiety disorders: Secondary | ICD-10-CM | POA: Diagnosis not present

## 2015-10-01 DIAGNOSIS — F329 Major depressive disorder, single episode, unspecified: Secondary | ICD-10-CM | POA: Insufficient documentation

## 2015-10-01 DIAGNOSIS — E782 Mixed hyperlipidemia: Secondary | ICD-10-CM | POA: Insufficient documentation

## 2015-10-01 DIAGNOSIS — E669 Obesity, unspecified: Secondary | ICD-10-CM | POA: Diagnosis not present

## 2015-10-01 DIAGNOSIS — Z7982 Long term (current) use of aspirin: Secondary | ICD-10-CM | POA: Insufficient documentation

## 2015-10-01 DIAGNOSIS — Z7984 Long term (current) use of oral hypoglycemic drugs: Secondary | ICD-10-CM | POA: Insufficient documentation

## 2015-10-01 DIAGNOSIS — J309 Allergic rhinitis, unspecified: Secondary | ICD-10-CM | POA: Diagnosis not present

## 2015-10-01 DIAGNOSIS — I10 Essential (primary) hypertension: Secondary | ICD-10-CM | POA: Diagnosis not present

## 2015-10-01 DIAGNOSIS — I2510 Atherosclerotic heart disease of native coronary artery without angina pectoris: Secondary | ICD-10-CM | POA: Insufficient documentation

## 2015-10-01 DIAGNOSIS — I252 Old myocardial infarction: Secondary | ICD-10-CM | POA: Diagnosis not present

## 2015-10-01 DIAGNOSIS — G894 Chronic pain syndrome: Secondary | ICD-10-CM | POA: Diagnosis not present

## 2015-10-01 DIAGNOSIS — S81801D Unspecified open wound, right lower leg, subsequent encounter: Secondary | ICD-10-CM

## 2015-10-01 DIAGNOSIS — Z955 Presence of coronary angioplasty implant and graft: Secondary | ICD-10-CM | POA: Insufficient documentation

## 2015-10-01 DIAGNOSIS — N521 Erectile dysfunction due to diseases classified elsewhere: Secondary | ICD-10-CM | POA: Insufficient documentation

## 2015-10-01 DIAGNOSIS — E1152 Type 2 diabetes mellitus with diabetic peripheral angiopathy with gangrene: Secondary | ICD-10-CM | POA: Insufficient documentation

## 2015-10-01 DIAGNOSIS — Z87891 Personal history of nicotine dependence: Secondary | ICD-10-CM | POA: Diagnosis not present

## 2015-10-01 DIAGNOSIS — S91001D Unspecified open wound, right ankle, subsequent encounter: Secondary | ICD-10-CM

## 2015-10-01 DIAGNOSIS — M5432 Sciatica, left side: Secondary | ICD-10-CM | POA: Insufficient documentation

## 2015-10-01 DIAGNOSIS — Z7902 Long term (current) use of antithrombotics/antiplatelets: Secondary | ICD-10-CM | POA: Diagnosis not present

## 2015-10-01 DIAGNOSIS — I96 Gangrene, not elsewhere classified: Secondary | ICD-10-CM | POA: Diagnosis present

## 2015-10-01 DIAGNOSIS — S81001D Unspecified open wound, right knee, subsequent encounter: Secondary | ICD-10-CM

## 2015-10-01 DIAGNOSIS — K219 Gastro-esophageal reflux disease without esophagitis: Secondary | ICD-10-CM | POA: Diagnosis not present

## 2015-10-01 DIAGNOSIS — Z96653 Presence of artificial knee joint, bilateral: Secondary | ICD-10-CM | POA: Diagnosis not present

## 2015-10-01 HISTORY — PX: SKIN SPLIT GRAFT: SHX444

## 2015-10-01 LAB — POCT I-STAT, CHEM 8
BUN: 17 mg/dL (ref 6–20)
CALCIUM ION: 1.18 mmol/L (ref 1.13–1.30)
CREATININE: 0.8 mg/dL (ref 0.61–1.24)
Chloride: 101 mmol/L (ref 101–111)
GLUCOSE: 107 mg/dL — AB (ref 65–99)
HCT: 33 % — ABNORMAL LOW (ref 39.0–52.0)
HEMOGLOBIN: 11.2 g/dL — AB (ref 13.0–17.0)
Potassium: 4.1 mmol/L (ref 3.5–5.1)
Sodium: 140 mmol/L (ref 135–145)
TCO2: 27 mmol/L (ref 0–100)

## 2015-10-01 LAB — GLUCOSE, CAPILLARY
GLUCOSE-CAPILLARY: 94 mg/dL (ref 65–99)
Glucose-Capillary: 88 mg/dL (ref 65–99)

## 2015-10-01 SURGERY — APPLICATION, GRAFT, SKIN, SPLIT-THICKNESS
Anesthesia: General | Site: Ankle | Laterality: Right

## 2015-10-01 MED ORDER — ONDANSETRON HCL 4 MG/2ML IJ SOLN
INTRAMUSCULAR | Status: DC | PRN
Start: 2015-10-01 — End: 2015-10-01
  Administered 2015-10-01: 4 mg via INTRAVENOUS

## 2015-10-01 MED ORDER — 0.9 % SODIUM CHLORIDE (POUR BTL) OPTIME
TOPICAL | Status: DC | PRN
  Administered 2015-10-01: 1000 mL

## 2015-10-01 MED ORDER — LACTATED RINGERS IV SOLN
INTRAVENOUS | Status: DC
  Administered 2015-10-01: 15:00:00 via INTRAVENOUS

## 2015-10-01 MED ORDER — MIDAZOLAM HCL 2 MG/2ML IJ SOLN
INTRAMUSCULAR | Status: AC
  Filled 2015-10-01: qty 2

## 2015-10-01 MED ORDER — LIDOCAINE HCL (CARDIAC) 10 MG/ML IV SOLN
INTRAVENOUS | Status: DC | PRN
  Administered 2015-10-01: 100 mg via INTRAVENOUS

## 2015-10-01 MED ORDER — FENTANYL CITRATE (PF) 250 MCG/5ML IJ SOLN
INTRAMUSCULAR | Status: AC
  Filled 2015-10-01: qty 5

## 2015-10-01 MED ORDER — ONDANSETRON HCL 4 MG/2ML IJ SOLN
INTRAMUSCULAR | Status: AC
  Filled 2015-10-01: qty 2

## 2015-10-01 MED ORDER — FENTANYL CITRATE (PF) 100 MCG/2ML IJ SOLN
INTRAMUSCULAR | Status: DC | PRN
  Administered 2015-10-01 (×2): 50 ug via INTRAVENOUS
  Administered 2015-10-01: 100 ug via INTRAVENOUS

## 2015-10-01 MED ORDER — CHLORHEXIDINE GLUCONATE 4 % EX LIQD: 60.0000 mL | Freq: Once | CUTANEOUS | Status: DC

## 2015-10-01 MED ORDER — MIDAZOLAM HCL 5 MG/5ML IJ SOLN
INTRAMUSCULAR | Status: DC | PRN
  Administered 2015-10-01: 2 mg via INTRAVENOUS

## 2015-10-01 MED ORDER — PROPOFOL 10 MG/ML IV BOLUS
INTRAVENOUS | Status: DC | PRN
  Administered 2015-10-01: 160 mg via INTRAVENOUS

## 2015-10-01 MED ORDER — ACETAMINOPHEN 650 MG RE SUPP: 650.0000 mg | Freq: Four times a day (QID) | RECTAL | Status: DC | PRN

## 2015-10-01 MED ORDER — ACETAMINOPHEN 325 MG PO TABS: 650.0000 mg | ORAL_TABLET | Freq: Four times a day (QID) | ORAL | Status: DC | PRN

## 2015-10-01 SURGICAL SUPPLY — 42 items
BNDG CMPR 9X4 STRL LF SNTH (GAUZE/BANDAGES/DRESSINGS)
BNDG COHESIVE 6X5 TAN STRL LF (GAUZE/BANDAGES/DRESSINGS) IMPLANT
BNDG ESMARK 4X9 LF (GAUZE/BANDAGES/DRESSINGS) ×1 IMPLANT
BNDG GAUZE STRTCH 6 (GAUZE/BANDAGES/DRESSINGS) IMPLANT
COVER SURGICAL LIGHT HANDLE (MISCELLANEOUS) ×4 IMPLANT
CUFF TOURNIQUET SINGLE 18IN (TOURNIQUET CUFF) IMPLANT
CUFF TOURNIQUET SINGLE 24IN (TOURNIQUET CUFF) IMPLANT
DERMACARRIERS GRAFT 1 TO 1.5 (DISPOSABLE)
DRAPE U-SHAPE 47X51 STRL (DRAPES) ×2 IMPLANT
DRSG ADAPTIC 3X8 NADH LF (GAUZE/BANDAGES/DRESSINGS) IMPLANT
DRSG MEPITEL 4X7.2 (GAUZE/BANDAGES/DRESSINGS) ×2 IMPLANT
DRSG VAC ATS SM SENSATRAC (GAUZE/BANDAGES/DRESSINGS) ×1 IMPLANT
DURAPREP 26ML APPLICATOR (WOUND CARE) ×2 IMPLANT
ELECT REM PT RETURN 9FT ADLT (ELECTROSURGICAL) ×2
ELECTRODE REM PT RTRN 9FT ADLT (ELECTROSURGICAL) ×1 IMPLANT
GAUZE SPONGE 4X4 12PLY STRL (GAUZE/BANDAGES/DRESSINGS) IMPLANT
GLOVE BIOGEL PI IND STRL 9 (GLOVE) ×1 IMPLANT
GLOVE BIOGEL PI INDICATOR 9 (GLOVE) ×1
GLOVE SURG ORTHO 9.0 STRL STRW (GLOVE) ×2 IMPLANT
GOWN STRL REUS W/ TWL XL LVL3 (GOWN DISPOSABLE) ×2 IMPLANT
GOWN STRL REUS W/TWL XL LVL3 (GOWN DISPOSABLE) ×4
GRAFT DERMACARRIERS 1 TO 1.5 (DISPOSABLE) IMPLANT
GRAFT TISS THERASKIN 2X3 (Tissue) IMPLANT
KIT BASIN OR (CUSTOM PROCEDURE TRAY) ×2 IMPLANT
KIT ROOM TURNOVER OR (KITS) ×2 IMPLANT
MANIFOLD NEPTUNE II (INSTRUMENTS) ×2 IMPLANT
NDL HYPO 25GX1X1/2 BEV (NEEDLE) IMPLANT
NEEDLE HYPO 25GX1X1/2 BEV (NEEDLE) IMPLANT
NS IRRIG 1000ML POUR BTL (IV SOLUTION) ×2 IMPLANT
PACK ORTHO EXTREMITY (CUSTOM PROCEDURE TRAY) ×2 IMPLANT
PAD ARMBOARD 7.5X6 YLW CONV (MISCELLANEOUS) ×4 IMPLANT
PAD CAST 4YDX4 CTTN HI CHSV (CAST SUPPLIES) IMPLANT
PADDING CAST COTTON 4X4 STRL (CAST SUPPLIES)
SUCTION FRAZIER HANDLE 10FR (MISCELLANEOUS)
SUCTION TUBE FRAZIER 10FR DISP (MISCELLANEOUS) IMPLANT
SUT ETHILON 4 0 PS 2 18 (SUTURE) IMPLANT
SYR CONTROL 10ML LL (SYRINGE) IMPLANT
TISSUE THERASKIN 2X3 (Tissue) ×2 IMPLANT
TOWEL OR 17X24 6PK STRL BLUE (TOWEL DISPOSABLE) ×2 IMPLANT
TOWEL OR 17X26 10 PK STRL BLUE (TOWEL DISPOSABLE) ×2 IMPLANT
TUBE CONNECTING 12X1/4 (SUCTIONS) ×1 IMPLANT
WATER STERILE IRR 1000ML POUR (IV SOLUTION) ×2 IMPLANT

## 2015-10-01 NOTE — Patient Outreach (Signed)
Notified by Education officer, museum, J. Saporito, that member was to discharge from rehab on Wednesday.  Call placed to member to start transition of care program.  Member state that he has not yet been discharged and will be going back in for another surgery today.  Will notify Mrs. Saporito of change in care plan.  Will start transition of care program upon discharge from hospital/rehab.  Travis [name], BSN, [place] [facility]  [facility] Care Manager [phone]

## 2015-10-01 NOTE — H&P (Signed)
Travis Kerrigan Sr. is an 65 y.o. male.   Chief Complaint: wound medial malleolus right ankle HPI: Travis [name] is a 59 old gentleman who is seen in follow-up status post open reduction internal fixation fracture dislocation of the right ankle. Patient's ankle was dislocated for a prolonged period of time prior to initial valuation this caused ischemic pressure on the medial aspect of the ankle. Patient presents at this time for wound care to minimize risk of loss of limb due to the progressive necrotic ulcer over the medial malleolus from the chronic dislocation.  Past Medical History  Diagnosis Date  . HYPERLIPIDEMIA-MIXED 07/15/2009  . Chronic pain syndrome 10/27/2009    of ankle, shoulders, low back.  sciatica.   Travis [name] HYPERTENSION 06/24/2009  . CORONARY ARTERY DISEASE 06/24/2009    a. s/p multiple PCIs - In 2008 he had a Taxus DES to the mild LAD, Endeavor DES to mid LCX and distal LCX. In January 2009 he had DES to distal LCX, mid LCX and proximal LCX. In November 2009 had BMS x 2 to the mid RCA. Cath 10/2011 with patent stents, noncardiac CP. LHC 01/2013: patent stents (noncardiac CP).  Travis [name] URETHRAL STRICTURE 06/24/2009    self catheterizes.   Travis [name] NEPHROLITHIASIS, HX OF 06/24/2009  . ERECTILE DYSFUNCTION, ORGANIC 05/30/2010  . Obesity   . ALLERGIC RHINITIS 06/24/2009  . Depression with anxiety     Prior suicide attempt  . Irregular heart beat   . Pneumonia   . Non-cardiac chest pain 10/2011, 01/2013  . Type II diabetes mellitus ([place]) 2012    no meds in 09/2014.   Travis [name] [place] DISEASE, LUMBAR 04/19/2010  . Arthritis     "all my joints" (09/30/2013)  . Fibromyalgia   . Myocardial infarction ([place]) 2008  . Heart murmur   . OSA (obstructive sleep apnea)     not using CPAP (09/30/2013)  . Depression   . History of kidney stones   . CAD (coronary artery disease) 06/24/2009    5 stents placed in 2007    . Chronic anticoagulation   . Closed fracture of right foot 10/17/2014  . Essential hypertension 06/24/2009   Qualifier: Diagnosis of  By: [name], [name]   . GERD (gastroesophageal reflux disease) 09/08/2015  . Hyperlipidemia 07/15/2009    Qualifier: Diagnosis of  By: Aundra Dubin, MD, [place]    . Major depression ([place]) 09/13/2015  . Open ankle fracture 09/02/2015  . Rotator cuff tear arthropathy of both shoulders 06/10/2013    History of bilateral shoulder cuff surgery for rotator cuff tears. Reports increase in pain 09/11/2015 during physical therapy of the left shoulder.   . S/P TKR (total knee replacement) bilaterally 09/13/2015    2007   . Spinal stenosis in cervical region 09/26/2013  . Spinal stenosis, lumbar region, with neurogenic claudication 09/26/2013  . Uncontrolled type 2 DM with peripheral circulatory disorder ([place]) 10/04/2013  . Allergic rhinitis 06/24/2009    Qualifier: Diagnosis of  By: [name], [name]   . Anxiety 11/12/2011    Adequate for discharge   . Arthritis of foot, right, degenerative 04/15/2014  . Balance disorder 03/12/2013  . Benign neoplasm of cecum   . Benign neoplasm of descending colon   . DEGENERATIVE JOINT DISEASE 06/24/2009    Qualifier: Diagnosis of  By: [name], [name]   . Fracture dislocation of ankle joint 09/02/2015  . Gait disorder 03/12/2013  . General weakness 07/14/2014  . Hand joint pain 06/10/2013  . Insomnia 10/04/2011  .  Left hip pain 03/12/2013    Injected under ultrasound guidance on June 24, 2013   . Occult blood positive stool 10/17/2014  . Pain of right thumb 04/03/2013  . PPD positive 04/08/2015  . Pre-ulcerative corn or callous 02/06/2013  . S/P laparoscopic hernia repair 05/11/2015  . SCIATICA, LEFT 04/19/2010    Qualifier: Diagnosis of  By: [name], [name]     Past Surgical History  Procedure Laterality Date  . Shoulder open rotator cuff repair Right X 2  . Total knee arthroplasty Bilateral 2008  . Carpal tunnel release Bilateral   . Urethral dilation  X 4  . Knee arthroscopy Right X 7  . Umbilical hernia repair      UHR  . Hernia repair       umbilical  . Shoulder arthroscopy w/ rotator cuff repair Bilateral     "3on the left; 2 on the right"  . Tonsillectomy    . Knee cartilage surgery Right X 4    "open; not scopes"  . Knee cartilage surgery Left X 3    "open; not scopes"  . Coronary angioplasty with stent placement      "I have 9 stents"  . Cardiac catheterization  X 1  . Anterior cervical decomp/discectomy fusion N/A 09/26/2013    Procedure: ANTERIOR CERVICAL DISCECTOMY FUSION C3-4, plate and screw fixation, allograft bone graft;  Surgeon: Jessy Oto, MD;  Location: [facility];  Service: Orthopedics;  Laterality: N/A;  . Laminectomy  01/28/2014    L3 L4 L5  . Lumbar laminectomy/decompression microdiscectomy N/A 01/27/2014    Procedure: CENTRAL LUMBAR LAMINECTOMY L4-5 AND L3-4;  Surgeon: Jessy Oto, MD;  Location: [facility];  Service: Orthopedics;  Laterality: N/A;  . Joint replacement Bilateral     knees  . Colonoscopy    . Vasectomy    . Fusion of talonavicular joint Right 04/15/2014    dr duda  . Ankle fusion Right 04/15/2014    Procedure: Right Subtalar, Talonavicular Fusion;  Surgeon: Newt Minion, MD;  Location: [facility];  Service: Orthopedics;  Laterality: Right;  . Left heart catheterization with coronary angiogram N/A 02/10/2013    Procedure: LEFT HEART CATHETERIZATION WITH CORONARY ANGIOGRAM;  Surgeon: Burnell Blanks, MD;  Location[facility] CATH LAB;  Service: Cardiovascular;  Laterality: N/A;  . Esophagogastroduodenoscopy N/A 10/19/2014    Procedure: ESOPHAGOGASTRODUODENOSCOPY (EGD);  Surgeon: Jerene Bears, MD;  [facility] END[facility]COPY;  Service: Endoscopy;  Laterality: N/A;  . Colonoscopy N/A 10/22/2014    Procedure: COLONOSCOPY;  Surgeon: Lafayette Dragon, MD;  Location: [facility] ENDOSCOPY;  Service: Endoscopy;  Laterality: N/A;  . Inguinal hernia repair  05/11/2015  . Inguinal hernia repair Right 05/11/2015    Procedure: LAPAROSCOPIC REPAIR RIGHT  INGUINAL HERNIA;  Surgeon: Greer Pickerel, MD;  Location: [facility];  Service:  General;  Laterality: Right;  . Insertion of mesh Right 05/11/2015    Procedure: INSERTION OF MESH;  Surgeon: Greer Pickerel, MD;  Location: [facility];  Service: General;  Laterality: Right;  . Orif ankle fracture Right 09/02/2015  . Peripherally inserted central catheter insertion  09/02/2015  . Orif ankle fracture Right 09/02/2015    Procedure: OPEN REDUCTION INTERNAL FIXATION (ORIF) ANKLE FRACTURE;  Surgeon: Newt Minion, MD;  Location: [facility];  Service: Orthopedics;  Laterality: Right;    Family History  Problem Relation Age of Onset  . Depression Mother   . Heart disease Mother   . Hypertension Mother   . Cancer Mother  Breast  . Diabetes Father   . Heart disease Father     CABG  . Cancer Father     prostate and skin cancer  . Hypertension Father   . Hyperlipidemia Father   . Depression Brother     x 2  . Hypertension Brother     x2  . Coronary artery disease Other   . Hypertension Other   . Depression Other   . Heart disease Maternal Grandfather   . Early death Maternal Grandfather 55    heart attack  . Early death Paternal Grandfather    Social History:  reports that he quit smoking about 5 years ago. His smoking use included Cigars. He has never used smokeless tobacco. He reports that he does not drink alcohol or use illicit drugs.  Allergies: No Known Allergies  No prescriptions prior to admission    No results found for this or any previous visit (from the past 48 hour(s)). No results found.  Review of Systems  All other systems reviewed and are negative.   There were no vitals taken for this visit. Physical Exam  On examination patient has necrotic ulcer over the medial malleolus. he is undergone wound care without resolution. There is no ascending cellulitis no odor no drainage. There is no exposed bone. Assessment/Plan Assessment: Necrotic ulcer medial malleolus right ankle status post open reduction internal fixation for chronic dislocated ankle.  Plan:  We'll plan for debridement of the wound  Placement of split-thickness skin graft placement of a wound VAC. Risks and benefits were discussed including infection neurovascular injury nonhealing the wound potential for amputation. Patient states he understands wishes to proceed at this time.  Newt Minion, MD 10/01/2015, 6:48 AM

## 2015-10-01 NOTE — Transfer of Care (Signed)
Immediate Anesthesia Transfer of Care Note  Patient: Travis Thebeau Sr.  Procedure(s) Performed: Procedure(s): RIGHT ANKLE APPLY SKIN GRAFT SPLIT THICKNESS (Right)  Patient Location: PACU  Anesthesia Type:General  Level of Consciousness: sedated and patient cooperative  Airway & Oxygen Therapy: Patient Spontanous Breathing and Patient connected to face mask oxygen  Post-op Assessment: Report given to RN, Post -op Vital signs reviewed and stable and Patient moving all extremities  Post vital signs: Reviewed and stable  Last Vitals:  Filed Vitals:   10/01/15 1430  BP: 118/53  Pulse: 60  Temp: 37.1 C  Resp: 16    Last Pain:  Filed Vitals:   10/01/15 1432  PainSc: 7       Patients Stated Pain Goal: 2 (12/78/71 8367)  Complications: No apparent anesthesia complications

## 2015-10-01 NOTE — Anesthesia Postprocedure Evaluation (Signed)
Anesthesia Post Note  Patient: Travis Luka Sr.  Procedure(s) Performed: Procedure(s) (LRB): RIGHT ANKLE APPLY SKIN GRAFT SPLIT THICKNESS (Right)  Patient location during evaluation: PACU Anesthesia Type: General Level of consciousness: sedated Pain management: pain level controlled Vital Signs Assessment: post-procedure vital signs reviewed and stable Respiratory status: spontaneous breathing and respiratory function stable Cardiovascular status: stable Anesthetic complications: no    Last Vitals:  Filed Vitals:   10/01/15 1630 10/01/15 1642  BP: 151/65   Pulse: 56 54  Temp:    Resp: 10 9    Last Pain:  Filed Vitals:   10/01/15 1642  PainSc: Asleep                 Channon Brougher DANIEL

## 2015-10-01 NOTE — Progress Notes (Signed)
Report called to [facility], spoke with Rica Records, RN at facility .Transportation to arrive at 5:15pm. Aware of discharge needs, copy of AVS sent with patient.

## 2015-10-01 NOTE — Anesthesia Procedure Notes (Signed)
Procedure Name: LMA Insertion Date/Time: 10/01/2015 3:42 PM Performed by: Merrilyn Puma B Pre-anesthesia Checklist: Patient identified, Emergency Drugs available, Timeout performed, Suction available and Patient being monitored Patient Re-evaluated:Patient Re-evaluated prior to inductionOxygen Delivery Method: Circle system utilized Preoxygenation: Pre-oxygenation with 100% oxygen Intubation Type: IV induction Ventilation: Mask ventilation without difficulty LMA: LMA inserted LMA Size: 5.0 Number of attempts: 1 Placement Confirmation: positive ETCO2 and breath sounds checked- equal and bilateral Tube secured with: Tape Dental Injury: Teeth and Oropharynx as per pre-operative assessment

## 2015-10-01 NOTE — Anesthesia Preprocedure Evaluation (Addendum)
Anesthesia Evaluation  Patient identified by MRN, date of birth, ID band Patient awake    Reviewed: Allergy & Precautions, NPO status , Patient's Chart, lab work & pertinent test results  Airway Mallampati: II  TM Distance: >3 FB Neck ROM: Full    Dental  (+) Dental Advisory Given, Poor Dentition   Pulmonary sleep apnea , former smoker,    Pulmonary exam normal        Cardiovascular hypertension, + CAD, + Past MI and + Peripheral Vascular Disease  Normal cardiovascular exam     Neuro/Psych    GI/Hepatic GERD  ,  Endo/Other  diabetes  Renal/GU      Musculoskeletal  (+) Arthritis , Fibromyalgia -  Abdominal   Peds  Hematology  (+) anemia , H/H 11.2/33   Anesthesia Other Findings   Reproductive/Obstetrics                            Anesthesia Physical Anesthesia Plan  ASA: III  Anesthesia Plan: General   Post-op Pain Management:    Induction: Intravenous  Airway Management Planned: LMA  Additional Equipment:   Intra-op Plan:   Post-operative Plan: Extubation in OR  Informed Consent: I have reviewed the patients History and Physical, chart, labs and discussed the procedure including the risks, benefits and alternatives for the proposed anesthesia with the patient or authorized representative who has indicated his/her understanding and acceptance.   Dental advisory given  Plan Discussed with: Anesthesiologist, Surgeon and CRNA  Anesthesia Plan Comments:        Anesthesia Quick Evaluation

## 2015-10-01 NOTE — Progress Notes (Signed)
Pt difficult IV insertion. After 3 attempts, only 22G able to be inserted, infusing without difficulty. Dr. Tobias Alexander aware.

## 2015-10-01 NOTE — Op Note (Signed)
10/01/2015  4:04 PM  PATIENT:  Travis Fortis Sr.    PRE-OPERATIVE DIAGNOSIS:  Gangrenous Ulcer Right Ankle  POST-OPERATIVE DIAGNOSIS:  Same  PROCEDURE:  RIGHT ANKLE APPLY SKIN GRAFT SPLIT THICKNESS Application of wound VAC  SURGEON:  DUDA,MARCUS V, MD  PHYSICIAN ASSISTANT:None ANESTHESIA:   General  PREOPERATIVE INDICATIONS:  Travis Flippen Sr. is a  65 y.o. male with a diagnosis of Gangrenous Ulcer Right Ankle who failed conservative measures and elected for surgical management.    The risks benefits and alternatives were discussed with the patient preoperatively including but not limited to the risks of infection, bleeding, nerve injury, cardiopulmonary complications, the need for revision surgery, among others, and the patient was willing to proceed.  OPERATIVE IMPLANTS: Application of 2 x 3" thera skin skin graft  OPERATIVE FINDINGS: Good granulation tissue after debridement  OPERATIVE PROCEDURE: Patient was brought to the operating room and underwent a general anesthetic. After adequate levels of anesthesia were obtained patient's right lower extremity was prepped using DuraPrep draped into a sterile field a timeout was called. The wound was debrided with a 10 blade knife of skin and soft tissue back to bleeding viable granulation tissue. The wound was 4 by 5 cm. This was then covered with 2 layers of the Thera skin skin graft cover with Mepilex and a incisional wound VAC. Patient was extubated to the PACU in stable condition plan for discharge back to skilled nursing.

## 2015-10-04 ENCOUNTER — Encounter (HOSPITAL_COMMUNITY): Payer: Self-pay | Admitting: Orthopedic Surgery

## 2015-10-11 ENCOUNTER — Other Ambulatory Visit: Payer: Self-pay | Admitting: *Deleted

## 2015-10-11 NOTE — Patient Outreach (Signed)
[facility]) [name]  10/11/2015  Travis Cartlidge Sr. 06-Jul-1950 [id number]   CSW was able to meet with patient today at [place], [place] where patient currently resides to receive short-term rehabilitative services, to attend Discharge Planning Meeting.  CSW introduced self, explained role and types of services provided through [name] ([name]) to patient's multidisciplinary team members.  CSW further explained to patient that CSW works with patient's RNCM, also with Travis [name], Travis [name]. CSW then explained the reason for the visit, indicating that CSW was there to assist with possible discharge planning needs and services from the skilled nursing facility.  CSW obtained two HIPAA compliant identifiers from patient, which included patient's name and date of birth. Patient, as well as his attending nurse and physical therapist reported that all home care arrangements have already been made for patient and that all durable medical equipment has already been delivered to patient's home.  Patient admits that he will be returning to live with his brother at time of discharge and that his brother will be providing for his transportation needs.  Patient denies being able to identify any additional social work needs at present. CSW will perform a case closure on patient, as all goals of treatment have been met from social work standpoint and no additional social work needs have been identified at this time. CSW will notify patient's RNCM with [name], Travis [name] of CSW's plans to close patient's case. CSW will fax a correspondence letter to patient's Primary Care Physician, Travis [name] to ensure that Travis [name] is aware of CSW's case closure plans.   CSW will submit a case closure request to Lurline Del, Care [name] Assistant with [name],  in the form of an [employer].  CSW will ensure that Travis [name] is aware of Travis [name], RNCM with [name], continued involvement with patient's care. Travis [name], BSW, MSW, LCSW  Licensed Education officer, [facility]  Mailing [place] [address] [place], [place] [address] Physical Address-[address], [place], [place] [address] Toll Free Main # [phone] Fax # [phone] Cell # [phone]  Fax # [phone]  [name] Patient's preferred language:  [place]   English  ATTENTION:  If you speak [place], language assistance services, free of charge, are available to you.    Nondiscrimination and Accessibility Statement: Discrimination is Against the [employer], a subsidiary of [employer], complies with [employer] civil rights laws and does not discriminate on the basis of race, color, national origin, age, disability, or sex.  [facility] does not exclude people or treat them differently because of race, color, national origin, age, disability, or sex.  [facility] Prov[facility] will:  . Provide free aids and services to people with disabilities to communicate effectively with [place], such as:     ? Qualified sign language interpreters  ? Written information in other formats (large print, audio, accessible electronic formats, other formats)   . Provide free language services to people whose primary language is not [place], such as:    ? Qualified interpreters    ? Information written in other languages   If you need these services, contact your [facility].  If you believe that a [facility] has failed to provide these services or discriminated in another way  on the basis of race, color, national origin, age, disability, or sex, you can file a  grievance with: [facility]'s Anonymous Reporting Helpline, 1-855-809-3042 or [email or web address].[email or web address].  You can file a grievance in person or by mail, fax, or email. If you need help filing a grievance, you may contact Sharon Kasica, Interim Compliance Officer, [facility] Department of Compliance and Integrity, [address] Floor, [place], [place] [address], [phone], [email or web address].    You can also file a civil rights complaint with the U.S. Department of Health and Human Services, Office for Civil Rights, electronically through the Office for Civil Rights Complaint Portal, available at https://ocrportal.hhs.gov/ocr/portal/lobby.jsf, or by mail or phone at:  U.S. Department of Health and Human Services [address], [facility] Building [place] [address]  1-800-368-1019, 800-537-7697 (TDD) Complaint forms are available at http://www.hhs.gov/ocr/office/file/index.html.        

## 2015-10-12 ENCOUNTER — Other Ambulatory Visit: Payer: Self-pay | Admitting: *Deleted

## 2015-10-12 NOTE — Patient Outreach (Signed)
Notified by Education officer, museum, J. Saporito, that member would discharge home with family yesterday.  Call placed to member today to start transition of care, however member state that he still has not been discharged from the facility yet.  He state that his discharge is scheduled for Thursday.  Made aware that this care manger will follow up with member on Friday.  Travis [name], BSN, [facility]  [facility] Care Manager [phone]

## 2015-10-13 ENCOUNTER — Encounter: Payer: Self-pay | Admitting: Nurse Practitioner

## 2015-10-13 ENCOUNTER — Non-Acute Institutional Stay (SKILLED_NURSING_FACILITY): Payer: Medicare PPO | Admitting: Nurse Practitioner

## 2015-10-13 DIAGNOSIS — K219 Gastro-esophageal reflux disease without esophagitis: Secondary | ICD-10-CM | POA: Diagnosis not present

## 2015-10-13 DIAGNOSIS — I2510 Atherosclerotic heart disease of native coronary artery without angina pectoris: Secondary | ICD-10-CM

## 2015-10-13 DIAGNOSIS — E1165 Type 2 diabetes mellitus with hyperglycemia: Secondary | ICD-10-CM | POA: Diagnosis not present

## 2015-10-13 DIAGNOSIS — F419 Anxiety disorder, unspecified: Secondary | ICD-10-CM

## 2015-10-13 DIAGNOSIS — N3942 Incontinence without sensory awareness: Secondary | ICD-10-CM

## 2015-10-13 DIAGNOSIS — S82899S Other fracture of unspecified lower leg, sequela: Secondary | ICD-10-CM | POA: Diagnosis not present

## 2015-10-13 DIAGNOSIS — E1151 Type 2 diabetes mellitus with diabetic peripheral angiopathy without gangrene: Secondary | ICD-10-CM | POA: Diagnosis not present

## 2015-10-13 DIAGNOSIS — G894 Chronic pain syndrome: Secondary | ICD-10-CM | POA: Diagnosis not present

## 2015-10-13 DIAGNOSIS — I10 Essential (primary) hypertension: Secondary | ICD-10-CM | POA: Diagnosis not present

## 2015-10-13 DIAGNOSIS — D62 Acute posthemorrhagic anemia: Secondary | ICD-10-CM

## 2015-10-13 DIAGNOSIS — IMO0002 Reserved for concepts with insufficient information to code with codable children: Secondary | ICD-10-CM

## 2015-10-13 DIAGNOSIS — N179 Acute kidney failure, unspecified: Secondary | ICD-10-CM | POA: Diagnosis not present

## 2015-10-13 DIAGNOSIS — F320 Major depressive disorder, single episode, mild: Secondary | ICD-10-CM

## 2015-10-13 DIAGNOSIS — I2583 Coronary atherosclerosis due to lipid rich plaque: Secondary | ICD-10-CM

## 2015-10-13 NOTE — Assessment & Plan Note (Signed)
Stable, 09/02/15 Na 136, K 4.4, Bun 14, creat 0.87 10/01/15 Na 140, K 4.1, Bun 17, creat 0.8

## 2015-10-13 NOTE — Assessment & Plan Note (Signed)
Managed with Tamsulosin.

## 2015-10-13 NOTE — Assessment & Plan Note (Signed)
Stable, continue Protonix 46m daily.

## 2015-10-13 NOTE — Assessment & Plan Note (Signed)
S/p ORIF  5/12/17RIGHT ANKLE APPLY SKIN GRAFT SPLIT THICKNESS

## 2015-10-13 NOTE — Assessment & Plan Note (Signed)
Controlled, 10/01/15 Na 140, K 4.1, Bun 17, creat 0.8

## 2015-10-13 NOTE — Assessment & Plan Note (Signed)
Takes metformin 541m bid

## 2015-10-13 NOTE — Assessment & Plan Note (Signed)
Mood is stable, continue Prozac 50m, Clonazepam 119mhs prn, Trazodone 10067maily, Abilify 5mg45mily.

## 2015-10-13 NOTE — Assessment & Plan Note (Signed)
Mood is stable, continue Prozac 56m, Clonazepam 17mhs prn, Trazodone 10026maily, Abilify 5mg90mily

## 2015-10-13 NOTE — Assessment & Plan Note (Signed)
09/02/15 Hgb 11.8 10/01/15 Hgb 11.2

## 2015-10-13 NOTE — Progress Notes (Signed)
Patient ID: Travis Highley Sr., male   DOB: 08-16-1950, 65 y.o.   MRN: [id number]  Location:  [facility] Room Number: [place] of Service:  SNF (31)  Provider: [name] NP  PCP: [name], MD Patient Care Team: [name], MD as PCP - General (Internal Medicine) [name], RN as [place] [facility] [name], MD as Attending Physician (Internal Medicine) Newt Minion, MD as Consulting Physician (Orthopedic Surgery) Greer Pickerel, MD as Consulting Physician (General Surgery) Larey Dresser, MD as Consulting Physician (Cardiology) Jerene Bears, MD as Consulting Physician (Gastroenterology) Jessy Oto, MD as Consulting Physician (Orthopedic Surgery) Ludwig Clarks, DO as Consulting Physician (Neurology) Kathlee Nations, MD as Consulting Physician (Psychiatry)  Extended Emergency Contact Information Primary Emergency Contact: [name],[name] Address: [address]          [place], [place] [address] [place] Phone: [phone] Mobile Phone: [phone] Relation: Brother Secondary Emergency Contact: [name] Address: [address]           [place], [place] [address] [place] Mobile Phone: [phone] Relation: Relative  Code Status: DNR Goals of care:  Advanced Directive information Advanced Directives 10/13/2015  Does patient have an advance directive? No  Does patient want to make changes to advanced directive? No - Patient declined  Would patient like information on creating an advanced directive? -     No Known Allergies  Chief Complaint  Patient presents with  . Discharge Note    HPI:  65 y.o. male  Has been in SNF for rehab for since the ORIF for fractured right ankle, he has regain his strength and mobility, stable to return home with home health and Rehab   Hx anemia, 09/02/15 Hgb 11.8, 11.2 10/01/15, no urinary retention while on Flomax 0.55m daily,  AKI last Bun/creat 14/0.87, anxiety, controlled on Prozac 450m Clonazepam prn, Trazodone 10052mhs, Abilify 5mg77mhronic pain is managed with Lyrica, Oxycodone, and Flexeril. Blood sugar is managed with Metformin 500mg39m. Takes Protonix for GERD. Blood pressure is controlled w/o meds.    Hospitalized from 09/02/15 to 09/07/15 for ORIF of fractured right ankle. Patient's wound VAC was removed prior to discharge the open wound tissue had healed and patient was discharged with dry dressing changes.  Past Medical History  Diagnosis Date  . HYPERLIPIDEMIA-MIXED 07/15/2009  . Chronic pain syndrome 10/27/2009    of ankle, shoulders, low back.  sciatica.   . HYPMarland KitchenRTENSION 06/24/2009  . CORONARY ARTERY DISEASE 06/24/2009    a. s/p multiple PCIs - In 2008 he had a Taxus DES to the mild LAD, Endeavor DES to mid LCX and distal LCX. In January 2009 he had DES to distal LCX, mid LCX and proximal LCX. In November 2009 had BMS x 2 to the mid RCA. Cath 10/2011 with patent stents, noncardiac CP. LHC 01/2013: patent stents (noncardiac CP).  . UREMarland KitchenHRAL STRICTURE 06/24/2009    self catheterizes.   . NEPMarland KitchenROLITHIASIS, HX OF 06/24/2009  . ERECTILE DYSFUNCTION, ORGANIC 05/30/2010  . Obesity   . ALLERGIC RHINITIS 06/24/2009  . Depression with anxiety     Prior suicide attempt  . Irregular heart beat   . Pneumonia   . Non-cardiac chest pain 10/2011, 01/2013  . Type II diabetes mellitus (HCC) [place]2    no meds in 09/2014.   . DISMarland Kitchen BellflowerASE, LUMBAR 04/19/2010  . Arthritis     "all my joints" (  09/30/2013)  . Fibromyalgia   . Myocardial infarction ([facility]) 2008  . Heart murmur   . OSA (obstructive sleep apnea)     not using CPAP (09/30/2013)  . Depression   . History of kidney stones   . CAD (coronary artery disease) 06/24/2009    5 stents placed in 2007    . Chronic anticoagulation   . Closed fracture of right foot 10/17/2014  . Essential hypertension 06/24/2009    Qualifier: Diagnosis of  By: [name], [name]   . GERD (gastroesophageal  reflux disease) 09/08/2015  . Hyperlipidemia 07/15/2009    Qualifier: Diagnosis of  By: Aundra Dubin, MD, [place]    . Major depression ([facility]) 09/13/2015  . Open ankle fracture 09/02/2015  . Rotator cuff tear arthropathy of both shoulders 06/10/2013    History of bilateral shoulder cuff surgery for rotator cuff tears. Reports increase in pain 09/11/2015 during physical therapy of the left shoulder.   . S/P TKR (total knee replacement) bilaterally 09/13/2015    2007   . Spinal stenosis in cervical region 09/26/2013  . Spinal stenosis, lumbar region, with neurogenic claudication 09/26/2013  . Uncontrolled type 2 DM with peripheral circulatory disorder ([place]) 10/04/2013  . Allergic rhinitis 06/24/2009    Qualifier: Diagnosis of  By: [name], [name]   . Anxiety 11/12/2011    Adequate for discharge   . Arthritis of foot, right, degenerative 04/15/2014  . Balance disorder 03/12/2013  . Benign neoplasm of cecum   . Benign neoplasm of descending colon   . DEGENERATIVE JOINT DISEASE 06/24/2009    Qualifier: Diagnosis of  By: [name], [name]   . Fracture dislocation of ankle joint 09/02/2015  . Gait disorder 03/12/2013  . General weakness 07/14/2014  . Hand joint pain 06/10/2013  . Insomnia 10/04/2011  . Left hip pain 03/12/2013    Injected under ultrasound guidance on June 24, 2013   . Occult blood positive stool 10/17/2014  . Pain of right thumb 04/03/2013  . PPD positive 04/08/2015  . Pre-ulcerative corn or callous 02/06/2013  . S/P laparoscopic hernia repair 05/11/2015  . SCIATICA, LEFT 04/19/2010    Qualifier: Diagnosis of  By: [name], [name]     Past Surgical History  Procedure Laterality Date  . Shoulder open rotator cuff repair Right X 2  . Total knee arthroplasty Bilateral 2008  . Carpal tunnel release Bilateral   . Urethral dilation  X 4  . Knee arthroscopy Right X 7  . Umbilical hernia repair      UHR  . Hernia repair      umbilical  . Shoulder arthroscopy w/ rotator cuff repair Bilateral       "3on the left; 2 on the right"  . Tonsillectomy    . Knee cartilage surgery Right X 4    "open; not scopes"  . Knee cartilage surgery Left X 3    "open; not scopes"  . Coronary angioplasty with stent placement      "I have 9 stents"  . Cardiac catheterization  X 1  . Anterior cervical decomp/discectomy fusion N/A 09/26/2013    Procedure: ANTERIOR CERVICAL DISCECTOMY FUSION C3-4, plate and screw fixation, allograft bone graft;  Surgeon: Jessy Oto, MD;  Location: [facility];  Service: Orthopedics;  Laterality: N/A;  . Laminectomy  01/28/2014    L3 L4 L5  . Lumbar laminectomy/decompression microdiscectomy N/A 01/27/2014    Procedure: CENTRAL LUMBAR LAMINECTOMY L4-5 AND L3-4;  Surgeon: Jessy Oto, MD;  Location: [facility];  Service: Orthopedics;  Laterality: N/A;  . Joint replacement Bilateral     knees  . Colonoscopy    . Vasectomy    . Fusion of talonavicular joint Right 04/15/2014    dr duda  . Ankle fusion Right 04/15/2014    Procedure: Right Subtalar, Talonavicular Fusion;  Surgeon: Newt Minion, MD;  Location: [facility];  Service: Orthopedics;  Laterality: Right;  . Left heart catheterization with coronary angiogram N/A 02/10/2013    Procedure: LEFT HEART CATHETERIZATION WITH CORONARY ANGIOGRAM;  Surgeon: Burnell Blanks, MD;  Location: [facility] CATH LAB;  Service: Cardiovascular;  Laterality: N/A;  . Esophagogastroduodenoscopy N/A 10/19/2014    Procedure: ESOPHAGOGASTRODUODENOSCOPY (EGD);  Surgeon: Jerene Bears, MD;  Location: [facility] ENDOSCOPY;  Service: Endoscopy;  Laterality: N/A;  . Colonoscopy N/A 10/22/2014    Procedure: COLONOSCOPY;  Surgeon: Lafayette Dragon, MD;  Location: [facility] ENDOSCOPY;  Service: Endoscopy;  Laterality: N/A;  . Inguinal hernia repair  05/11/2015  . Inguinal hernia repair Right 05/11/2015    Procedure: LAPAROSCOPIC REPAIR RIGHT  INGUINAL HERNIA;  Surgeon: Greer Pickerel, MD;  Location: [facility];  Service: General;  Laterality: Right;  . Insertion of mesh Right 05/11/2015     Procedure: INSERTION OF MESH;  Surgeon: Greer Pickerel, MD;  Location: [facility];  Service: General;  Laterality: Right;  . Orif ankle fracture Right 09/02/2015  . Peripherally inserted central catheter insertion  09/02/2015  . Orif ankle fracture Right 09/02/2015    Procedure: OPEN REDUCTION INTERNAL FIXATION (ORIF) ANKLE FRACTURE;  Surgeon: Newt Minion, MD;  Location: [facility];  Service: Orthopedics;  Laterality: Right;  . Skin split graft Right 10/01/2015    Procedure: RIGHT ANKLE APPLY SKIN GRAFT SPLIT THICKNESS;  Surgeon: Newt Minion, MD;  Location: [facility];  Service: Orthopedics;  Laterality: Right;      reports that he quit smoking about 5 years ago. His smoking use included Cigars. He has never used smokeless tobacco. He reports that he does not drink alcohol or use illicit drugs. Social History   Social History  . Marital Status: Divorced    Spouse Name: N/A  . Number of Children: 2  . Years of Education: N/A   Occupational History  . disabled since 2006 due to ortho. heart, psych   . part time work as an Multimedia programmer, wrestling, and baseball Agricultural consultant    Social History Main Topics  . Smoking status: Former Smoker    Types: Cigars    Quit date: 08/28/2010  . Smokeless tobacco: Never Used     Comment: 09/30/2013 "smoked 1 cigar/wk when I did smoke"  . Alcohol Use: No     Comment: rare (4 beers all summer)  None at all now (03/2014)  . Drug Use: No  . Sexual Activity: Yes   Other Topics Concern  . Not on file   Social History Narrative   Played semi-pro football, used steroids   Divorced moved here from [place], [place]   Patient states he has been on disability since his knee surgery.      03/05/2013 AHW "[name]" was born and grew up in [place], [place], and moved to [place], [place] at age 23. He has one sister and 3 brothers. He reports that his childhood was rough. His parents are deceased. He graduated from [employer], and attended one year of  college. He is currently unemployed and on disability for multiple medical problems. He has been married 5  times. He has 2 sons. He currently lives alone. 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 He denies that he has any social support system. 03/05/2013 AHW            Functional Status Survey:    No Known Allergies  Pertinent  Health Maintenance Due  Topic Date Due  . OPHTHALMOLOGY EXAM  05/28/1962  . HEMOGLOBIN A1C  09/10/2015  . INFLUENZA VACCINE  12/21/2015  . FOOT EXAM  03/10/2016  . URINE MICROALBUMIN  03/11/2016  . COLONOSCOPY  10/21/2024    Medications:   Medication List       This list is accurate as of: 10/13/15 12:22 PM.  Always use your most recent med list.               ARIPiprazole 5 MG tablet  Commonly known as:  ABILIFY  Take 5 mg by mouth daily. Take one tablet at bedtime     aspirin 81 MG tablet  Take 81 mg by mouth daily.     atorvastatin 20 MG tablet  Commonly known as:  LIPITOR  TAKE 1 TABLET BY MOUTH EVERY DAY     clonazePAM 1 MG tablet  Commonly known as:  KLONOPIN  TAKE 1 TABLET BY MOUTH AT BEDTIME AS NEEDED FOR ANXIETY     Fish Oil 1000 MG Caps  Take 1,000 mg by mouth daily.     FLUoxetine 40 MG capsule  Commonly known as:  PROZAC  Take 1 capsule (40 mg total) by mouth daily.     LYRICA 100 MG capsule  Generic drug:  pregabalin  Take 1 capsule (100 mg total) by mouth 3 (three) times daily.     metFORMIN 500 MG tablet  Commonly known as:  GLUCOPHAGE  Take 1 tablet (500 mg total) by mouth 2 (two) times daily with a meal.     oxycodone 30 MG immediate release tablet  Commonly known as:  ROXICODONE  Take 1 tablet (30 mg total) by mouth 5 (five) times daily.     prasugrel 5 MG Tabs tablet  Commonly known as:  EFFIENT  Take 1 tablet (5 mg total) by mouth daily.     tamsulosin 0.4 MG Caps capsule  Commonly known as:  FLOMAX  Take 0.4 mg by mouth daily after breakfast.     traZODone 100  MG tablet  Commonly known as:  DESYREL  Take 1 tablet (100 mg total) by mouth at bedtime.     VITAMIN C PO  Take by mouth.     Zinc Sulfate 220 (50 Zn) MG Tabs  Take 1 tablet by mouth daily.        Review of Systems  Constitutional: Negative for fever, activity change, appetite change, fatigue and unexpected weight change.       Obese  HENT: Negative for congestion, ear pain, hearing loss, rhinorrhea, sore throat, tinnitus, trouble swallowing and voice change.   Eyes: Positive for visual disturbance (prescription lenses).       Corrective lenses  Respiratory: Negative for cough, choking, chest tightness, shortness of breath and wheezing.   Cardiovascular: Negative for chest pain, palpitations and leg swelling.       History of coronary artery disease and 9 and stents  Gastrointestinal: Negative for nausea, abdominal pain, diarrhea, constipation and abdominal distention.       History of benign adenomas. Last colonoscopy 10/22/2014.  Endocrine: Negative for cold intolerance, heat intolerance, polydipsia, polyphagia and polyuria.  Diabetes mellitus, historically has been poorly controlled.  Genitourinary: Negative for dysuria, urgency, frequency and testicular pain.       BPH by history.  Musculoskeletal: Positive for arthralgias and gait problem. Negative for myalgias, back pain and neck pain.       History of bilateral shoulder surgery for rotator cuff tears. Increase in pain in the left shoulder since 09/11/2015 while taking physical therapy. Bilateral total knee replacements.  Skin: Negative.  Negative for color change, pallor and rash.  Allergic/Immunologic: Negative.   Neurological: Negative for dizziness, tremors, syncope, speech difficulty, weakness, numbness and headaches.       Peripheral neuropathy for which he takes Lyrica.  Hematological: Negative for adenopathy. Does not bruise/bleed easily.       Recurrent anemia problems.  Psychiatric/Behavioral: Negative for  hallucinations, behavioral problems, confusion, sleep disturbance and decreased concentration. The patient is not nervous/anxious.        History major depression, currently controlled. Possible bipolar disorder.. Patient has been to behavioral health. He has seen Dr.Arfeen.    There were no vitals filed for this visit. There is no weight on file to calculate BMI. Physical Exam  Constitutional: He is oriented to person, place, and time. He appears well-developed. No distress.  Obese  HENT:  Right Ear: External ear normal.  Left Ear: External ear normal.  Nose: Nose normal.  Mouth/Throat: Oropharynx is clear and moist. No oropharyngeal exudate.  Eyes: Conjunctivae and EOM are normal. Pupils are equal, round, and reactive to light.  Neck: No JVD present. No tracheal deviation present. No thyromegaly present.  Cardiovascular: Normal rate, regular rhythm, normal heart sounds and intact distal pulses.  Exam reveals no gallop and no friction rub.   No murmur heard. Pulmonary/Chest: No respiratory distress. He has no wheezes. He has no rales. He exhibits no tenderness.  Abdominal: He exhibits no distension and no mass. There is no tenderness.  Genitourinary:  BPH by history. Patient was incontinent at one time but has reestablish continence.  Musculoskeletal: Normal range of motion. He exhibits no edema or tenderness.  Recent fracture of the right ankle. Limited range of motion of both shoulders. Difficult to abduct, rotate, or extended arms above his head.  Lymphadenopathy:    He has no cervical adenopathy.  Neurological: He is alert and oriented to person, place, and time. He has normal reflexes. No cranial nerve deficit. Coordination normal.  Skin: No rash noted. No erythema. No pallor.  Surgical wound of the right ankle healing nicely Right inner ankle has a stage III wound measuring 5.5 cm x 5.0 cm x 0 1 cm. The areas 50% red and beefy and raw. 50% of the wound is slough.  Psychiatric:  He has a normal mood and affect. His behavior is normal. Judgment and thought content normal.    Labs reviewed: Basic Metabolic Panel:  Recent Labs  05/10/15 1327 05/16/15 1534 05/16/15 1841 09/02/15 1949 09/02/15 1955 10/01/15 1456  NA 140 136 140 136 136 140  K 4.3 5.5* 3.8 4.3 4.4 4.1  CL 105 101 99*  --  100* 101  CO2 25 28  --   --  24  --   GLUCOSE 109* 121* 95 111* 109* 107*  BUN 21* 24* 24*  --  14 17  CREATININE 0.89 1.02 0.90  --  0.87 0.80  CALCIUM 9.0 8.9  --   --  8.8*  --    Liver Function Tests:  Recent Labs  03/12/15 1240 04/08/15 1240 09/02/15  1955  AST 18 15 40  ALT 12 16 18   ALKPHOS 72 72 59  BILITOT 0.5 0.3 0.9  PROT 6.4 7.4 6.6  ALBUMIN 4.0 4.0 3.8    Recent Labs  04/08/15 1240  LIPASE 9.0*   No results for input(s): AMMONIA in the last 8760 hours. CBC:  Recent Labs  04/08/15 1240 05/10/15 1327 05/12/15 0600 05/16/15 1534  09/02/15 1949 09/02/15 1955 10/01/15 1456  WBC 6.9 4.0 4.6 5.3  --   --  4.6  --   NEUTROABS 4.1 2.2  --  3.0  --   --   --   --   HGB 12.5* 11.7* 11.7* 10.7*  < > 12.2* 11.8* 11.2*  HCT 37.3* 35.1* 34.9* 31.4*  < > 36.0* 36.0* 33.0*  MCV 89.0 88.9 89.9 89.5  --   --  90.9  --   PLT 381.0 200 204 374  --   --  94*  --   < > = values in this interval not displayed. Cardiac Enzymes:  Recent Labs  10/17/14 2042 10/18/14 0930  TROPONINI 0.05*  0.05* 0.06*   BNP: Invalid input(s): POCBNP CBG:  Recent Labs  09/07/15 1142 10/01/15 1423 10/01/15 1615  GLUCAP 123* 94 88    Procedures and Imaging Studies During Stay: No results found.  Assessment/Plan:   Open ankle fracture S/p ORIF  5/12/17RIGHT ANKLE APPLY SKIN GRAFT SPLIT THICKNESS  Acute blood loss anemia 09/02/15 Hgb 11.8 10/01/15 Hgb 11.2   AKI (acute kidney injury) ([place]) Stable, 09/02/15 Na 136, K 4.4, Bun 14, creat 0.87 10/01/15 Na 140, K 4.1, Bun 17, creat 0.8  Anxiety Mood is stable, continue Prozac 7m, Clonazepam 178mhs prn,  Trazodone 10021maily, Abilify 5mg53mily.    CAD (coronary artery disease) 5 stents placed in 2007 Continue Prasugrel, ASA, statin  Chronic pain syndrome Pain is managed with Flexeril 10mg23m prn, Lyrica 100mg 29m Oxycodone 30mg 521my, prn Opana   Essential hypertension Controlled, 10/01/15 Na 140, K 4.1, Bun 17, creat 0.8   GERD (gastroesophageal reflux disease) Stable, continue Protonix 40mg da78m    Major depression (HCC) Mood is stable, continue Prozac 40mg, Cl48mepam 1mg hs pr27mTrazodone 100mg daily42milify 5mg daily  12montrolled type 2 DM with peripheral circulatory disorder (HCC) Takes metformin 500mg bid  Ur43my incontinence Managed with Tamsulosin.     Patient is being discharged with the following home health services:    Patient is being discharged with the following durable medical equipment:    Patient has been advised to f/u with their PCP in 1-2 weeks to bring them up to date on their rehab stay.  Social services at facility was responsible for arranging this appointment.  Pt was provided with a 30 day supply of prescriptions for medications and refills must be obtained from their PCP.  For controlled substances, a more limited supply may be provided adequate until PCP appointment only.  Future labs/tests needed: none

## 2015-10-13 NOTE — Assessment & Plan Note (Signed)
Pain is managed with Flexeril 49m bid prn, Lyrica 1077mtid, Oxycodone 308mx/day, prn Opana

## 2015-10-13 NOTE — Assessment & Plan Note (Addendum)
5 stents placed in 2007 Continue Prasugrel, ASA, statin

## 2015-10-15 ENCOUNTER — Telehealth: Payer: Self-pay | Admitting: Internal Medicine

## 2015-10-15 ENCOUNTER — Other Ambulatory Visit: Payer: Self-pay | Admitting: *Deleted

## 2015-10-15 NOTE — Patient Outreach (Signed)
Member reported earlier this week that he would be discharging on Thursday.  Call placed to [facility] to confirm discharge prior to contact with member to start transition of care program.  According to receptionist, member remains a patient at the facility.  [place]ill [place], [facility] LCSW, notified that member remains at facility.  Will continue to watch for discharge in order to start transition of care.  [name], BSN, [place] Management  [facility] Care Manager [phone]

## 2015-10-15 NOTE — Telephone Encounter (Signed)
Just came out of [facility].  Patient refused therapy today because he had a doctors appointment.  Will start PT tomorrow.

## 2015-10-27 ENCOUNTER — Telehealth: Payer: Self-pay

## 2015-10-27 DIAGNOSIS — M4806 Spinal stenosis, lumbar region: Secondary | ICD-10-CM | POA: Diagnosis not present

## 2015-10-27 DIAGNOSIS — G894 Chronic pain syndrome: Secondary | ICD-10-CM | POA: Diagnosis not present

## 2015-10-27 DIAGNOSIS — R2681 Unsteadiness on feet: Secondary | ICD-10-CM | POA: Diagnosis not present

## 2015-10-27 NOTE — Telephone Encounter (Signed)
Home Health Cert/Plan of Care received (10/16/2015 - 12/14/2015) and placed on MD's desk for signature

## 2015-11-01 ENCOUNTER — Encounter: Payer: Self-pay | Admitting: *Deleted

## 2015-11-01 ENCOUNTER — Other Ambulatory Visit: Payer: Self-pay | Admitting: *Deleted

## 2015-11-01 NOTE — Telephone Encounter (Signed)
Paperwork signed, faxed, copy sent to scan

## 2015-11-01 NOTE — Patient Outreach (Signed)
Call placed to member to follow up on discharge and to initiate transition of care program.  He verifies identity, states he's been home over 2 weeks from the facility.  [facility] care management services explained, including all disciplines.  He denies the need for involvement.  He confirms that he has home health nursing and therapy involved, states that this is adequate for him at this time.  Denies the need for social worker and pharmacist involvement.  Advised that [facility] is available in the future for resources if needed.  Will notify care management assistant and PCP of case closure.  [name], [place], MSN [place] [phone]

## 2015-11-05 ENCOUNTER — Encounter: Payer: Self-pay | Admitting: Internal Medicine

## 2015-11-05 ENCOUNTER — Ambulatory Visit (INDEPENDENT_AMBULATORY_CARE_PROVIDER_SITE_OTHER): Payer: Medicare PPO | Admitting: Internal Medicine

## 2015-11-05 VITALS — BP 120/66 | HR 79 | Temp 98.1°F | Ht 73.0 in | Wt 284.0 lb

## 2015-11-05 DIAGNOSIS — E785 Hyperlipidemia, unspecified: Secondary | ICD-10-CM

## 2015-11-05 DIAGNOSIS — J309 Allergic rhinitis, unspecified: Secondary | ICD-10-CM

## 2015-11-05 DIAGNOSIS — E1151 Type 2 diabetes mellitus with diabetic peripheral angiopathy without gangrene: Secondary | ICD-10-CM

## 2015-11-05 DIAGNOSIS — Z0001 Encounter for general adult medical examination with abnormal findings: Secondary | ICD-10-CM | POA: Diagnosis not present

## 2015-11-05 DIAGNOSIS — Z0000 Encounter for general adult medical examination without abnormal findings: Secondary | ICD-10-CM | POA: Insufficient documentation

## 2015-11-05 DIAGNOSIS — R6889 Other general symptoms and signs: Secondary | ICD-10-CM

## 2015-11-05 DIAGNOSIS — I10 Essential (primary) hypertension: Secondary | ICD-10-CM

## 2015-11-05 DIAGNOSIS — E1165 Type 2 diabetes mellitus with hyperglycemia: Secondary | ICD-10-CM

## 2015-11-05 DIAGNOSIS — IMO0002 Reserved for concepts with insufficient information to code with codable children: Secondary | ICD-10-CM

## 2015-11-05 MED ORDER — TRIAMCINOLONE ACETONIDE 55 MCG/ACT NA AERO: 2.0000 | INHALATION_SPRAY | Freq: Every day | NASAL | Status: DC

## 2015-11-05 NOTE — Progress Notes (Signed)
Pre visit review using our clinic review tool, if applicable. No additional management support is needed unless otherwise documented below in the visit note. 

## 2015-11-05 NOTE — Assessment & Plan Note (Addendum)
Mild to mod, for start nasacort asd,  to f/u any worsening symptoms or concerns  In addition to the time spent performing CPE, I spent an additional 25 minutes face to face,in which greater than 50% of this time was spent in counseling and coordination of care for patient's acute illness as documented.

## 2015-11-05 NOTE — Assessment & Plan Note (Signed)
stable overall by history and exam, recent data reviewed with pt, and pt to continue medical treatment as before,  to f/u any worsening symptoms or concerns Lab Results  Component Value Date   LDLCALC 51 03/12/2015

## 2015-11-05 NOTE — Progress Notes (Signed)
Subjective:    Patient ID: Travis Fortis Sr., male    DOB: Jan 02, 1951, 65 y.o.   MRN: [id number]  HPI  Here for wellness and f/u;  Overall doing ok;  Pt denies Chest pain, worsening SOB, DOE, wheezing, orthopnea, PND, worsening LE edema, palpitations, dizziness or syncope. Pt states overall good compliance with treatment and medications, good tolerability, and has been trying to follow appropriate diet.  Pt denies worsening depressive symptoms, suicidal ideation or panic. No fever, night sweats, wt loss, loss of appetite, or other constitutional symptoms.  Pt states good ability with ADL's, has low fall risk, home safety reviewed and adequate, no other significant changes in hearing or vision, and not active with exercise due to orthopedic concerns  Here post hospn for operative management per ortho for right gangrenous ankle ulcer 10-01-2015.  Was in rehab x 6 wks, still walks with cane, more due to left hip pain. Ankle ulcer slowly healing, does not need wound clinic, follows with daily tx at home and last ortho visit just last wk.  Pt denies chest pain, increasing sob or doe, wheezing, orthopnea, PND, increased LE swelling, palpitations, dizziness or syncope.  Pt denies new neurological symptoms such as new headache, or facial or extremity weakness or numbness.  Pt denies polydipsia, polyuria, or low sugar episode.   Pt denies new neurological symptoms such as new headache, or facial or extremity weakness or numbness.   Pt states overall good compliance with meds, mostly trying to follow appropriate diet, with wt overall stable,  but little exercise however.  Does have several wks ongoing nasal allergy symptoms with clearish congestion, itch and sneezing, without fever, pain, ST, cough, swelling or wheezing. Past Medical History  Diagnosis Date  . HYPERLIPIDEMIA-MIXED 07/15/2009  . Chronic pain syndrome 10/27/2009    of ankle, shoulders, low back.  sciatica.   [name] HYPERTENSION 06/24/2009  . CORONARY  ARTERY DISEASE 06/24/2009    a. s/p multiple PCIs - In 2008 he had a Taxus DES to the mild LAD, Endeavor DES to mid LCX and distal LCX. In January 2009 he had DES to distal LCX, mid LCX and proximal LCX. In November 2009 had BMS x 2 to the mid RCA. Cath 10/2011 with patent stents, noncardiac CP. LHC 01/2013: patent stents (noncardiac CP).  [name] URETHRAL STRICTURE 06/24/2009    self catheterizes.   [name] NEPHROLITHIASIS, HX OF 06/24/2009  . ERECTILE DYSFUNCTION, ORGANIC 05/30/2010  . Obesity   . ALLERGIC RHINITIS 06/24/2009  . Depression with anxiety     Prior suicide attempt  . Irregular heart beat   . Pneumonia   . Non-cardiac chest pain 10/2011, 01/2013  . Type II diabetes mellitus ([place]) 2012    no meds in 09/2014.   [name] [place] DISEASE, LUMBAR 04/19/2010  . Arthritis     "all my joints" (09/30/2013)  . Fibromyalgia   . Myocardial infarction ([place]) 2008  . Heart murmur   . OSA (obstructive sleep apnea)     not using CPAP (09/30/2013)  . Depression   . History of kidney stones   . CAD (coronary artery disease) 06/24/2009    5 stents placed in 2007    . Chronic anticoagulation   . Closed fracture of right foot 10/17/2014  . Essential hypertension 06/24/2009    Qualifier: Diagnosis of  By: [name], [name]   . GERD (gastroesophageal reflux disease) 09/08/2015  . Hyperlipidemia 07/15/2009    Qualifier: Diagnosis of  By: Aundra Dubin, MD, [place]    .  Major depression ([place]) 09/13/2015  . Open ankle fracture 09/02/2015  . Rotator cuff tear arthropathy of both shoulders 06/10/2013    History of bilateral shoulder cuff surgery for rotator cuff tears. Reports increase in pain 09/11/2015 during physical therapy of the left shoulder.   . S/P TKR (total knee replacement) bilaterally 09/13/2015    2007   . Spinal stenosis in cervical region 09/26/2013  . Spinal stenosis, lumbar region, with neurogenic claudication 09/26/2013  . Uncontrolled type 2 DM with peripheral circulatory disorder ([place]) 10/04/2013  . Allergic rhinitis 06/24/2009     Qualifier: Diagnosis of  By: [name], [name]   . Anxiety 11/12/2011    Adequate for discharge   . Arthritis of foot, right, degenerative 04/15/2014  . Balance disorder 03/12/2013  . Benign neoplasm of cecum   . Benign neoplasm of descending colon   . DEGENERATIVE JOINT DISEASE 06/24/2009    Qualifier: Diagnosis of  By: [name], [name]   . Fracture dislocation of ankle joint 09/02/2015  . Gait disorder 03/12/2013  . General weakness 07/14/2014  . Hand joint pain 06/10/2013  . Insomnia 10/04/2011  . Left hip pain 03/12/2013    Injected under ultrasound guidance on June 24, 2013   . Occult blood positive stool 10/17/2014  . Pain of right thumb 04/03/2013  . PPD positive 04/08/2015  . Pre-ulcerative corn or callous 02/06/2013  . S/P laparoscopic hernia repair 05/11/2015  . SCIATICA, LEFT 04/19/2010    Qualifier: Diagnosis of  By: [name], [name]    Past Surgical History  Procedure Laterality Date  . Shoulder open rotator cuff repair Right X 2  . Total knee arthroplasty Bilateral 2008  . Carpal tunnel release Bilateral   . Urethral dilation  X 4  . Knee arthroscopy Right X 7  . Umbilical hernia repair      UHR  . Hernia repair      umbilical  . Shoulder arthroscopy w/ rotator cuff repair Bilateral     "3on the left; 2 on the right"  . Tonsillectomy    . Knee cartilage surgery Right X 4    "open; not scopes"  . Knee cartilage surgery Left X 3    "open; not scopes"  . Coronary angioplasty with stent placement      "I have 9 stents"  . Cardiac catheterization  X 1  . Anterior cervical decomp/discectomy fusion N/A 09/26/2013    Procedure: ANTERIOR CERVICAL DISCECTOMY FUSION C3-4, plate and screw fixation, allograft bone graft;  Surgeon: Jessy Oto, MD;  Location: [facility];  Service: Orthopedics;  Laterality: N/A;  . Laminectomy  01/28/2014    L3 L4 L5  . Lumbar laminectomy/decompression microdiscectomy N/A 01/27/2014    Procedure: CENTRAL LUMBAR LAMINECTOMY L4-5 AND L3-4;  Surgeon:  Jessy Oto, MD;  Location: [facility];  Service: Orthopedics;  Laterality: N/A;  . Joint replacement Bilateral     knees  . Colonoscopy    . Vasectomy    . Fusion of talonavicular joint Right 04/15/2014    dr duda  . Ankle fusion Right 04/15/2014    Procedure: Right Subtalar, Talonavicular Fusion;  Surgeon: Newt Minion, MD;  Location: [facility];  Service: Orthopedics;  Laterality: Right;  . Left heart catheterization with coronary angiogram N/A 02/10/2013    Procedure: LEFT HEART CATHETERIZATION WITH CORONARY ANGIOGRAM;  Surgeon: Burnell Blanks, MD;  Location: [facility] CATH LAB;  Service: Cardiovascular;  Laterality: N/A;  . Esophagogastroduodenoscopy N/A 10/19/2014  Procedure: ESOPHAGOGASTRODUODENOSCOPY (EGD);  Surgeon: Jerene Bears, MD;  Location: [facility] ENDOSCOPY;  Service: Endoscopy;  Laterality: N/A;  . Colonoscopy N/A 10/22/2014    Procedure: COLONOSCOPY;  Surgeon: Lafayette Dragon, MD;  Location: [facility] ENDOSCOPY;  Service: Endoscopy;  Laterality: N/A;  . Inguinal hernia repair  05/11/2015  . Inguinal hernia repair Right 05/11/2015    Procedure: LAPAROSCOPIC REPAIR RIGHT  INGUINAL HERNIA;  Surgeon: Greer Pickerel, MD;  Location: [place];  Service: General;  Laterality: Right;  . Insertion of mesh Right 05/11/2015    Procedure: INSERTION OF MESH;  Surgeon: Greer Pickerel, MD;  Location: [place];  Service: General;  Laterality: Right;  . Orif ankle fracture Right 09/02/2015  . Peripherally inserted central catheter insertion  09/02/2015  . Orif ankle fracture Right 09/02/2015    Procedure: OPEN REDUCTION INTERNAL FIXATION (ORIF) ANKLE FRACTURE;  Surgeon: Newt Minion, MD;  Location: [place];  Service: Orthopedics;  Laterality: Right;  . Skin split graft Right 10/01/2015    Procedure: RIGHT ANKLE APPLY SKIN GRAFT SPLIT THICKNESS;  Surgeon: Newt Minion, MD;  Location: [place];  Service: Orthopedics;  Laterality: Right;    reports that he quit smoking about 5 years ago. His smoking use included Cigars. He has never  used smokeless tobacco. He reports that he does not drink alcohol or use illicit drugs. family history includes Cancer in his father and mother; Coronary artery disease in his other; Depression in his brother, mother, and other; Diabetes in his father; Early death in his paternal grandfather; Early death (age of onset: 78) in his maternal grandfather; Heart disease in his father, maternal grandfather, and mother; Hyperlipidemia in his father; Hypertension in his brother, father, mother, and other. No Known Allergies Current Outpatient Prescriptions on File Prior to Visit  Medication Sig Dispense Refill  . ARIPiprazole (ABILIFY) 5 MG tablet Take 5 mg by mouth daily. Take one tablet at bedtime  0  . Ascorbic Acid (VITAMIN C PO) Take by mouth.    [name] aspirin 81 MG tablet Take 81 mg by mouth daily.    [name] atorvastatin (LIPITOR) 20 MG tablet TAKE 1 TABLET BY MOUTH EVERY DAY 90 tablet 3  . clonazePAM (KLONOPIN) 1 MG tablet TAKE 1 TABLET BY MOUTH AT BEDTIME AS NEEDED FOR ANXIETY 30 tablet 5  . FLUoxetine (PROZAC) 40 MG capsule Take 1 capsule (40 mg total) by mouth daily. 90 capsule 0  . LYRICA 100 MG capsule Take 1 capsule (100 mg total) by mouth 3 (three) times daily. 90 capsule 2  . metFORMIN (GLUCOPHAGE) 500 MG tablet Take 1 tablet (500 mg total) by mouth 2 (two) times daily with a meal. 180 tablet 3  . Omega-3 Fatty Acids (FISH OIL) 1000 MG CAPS Take 1,000 mg by mouth daily.     [name] oxycodone (ROXICODONE) 30 MG immediate release tablet Take 1 tablet (30 mg total) by mouth 5 (five) times daily. 150 tablet 0  . prasugrel (EFFIENT) 5 MG TABS tablet Take 1 tablet (5 mg total) by mouth daily. 30 tablet 3  . tamsulosin (FLOMAX) 0.4 MG CAPS capsule Take 0.4 mg by mouth daily after breakfast.     . traZODone (DESYREL) 100 MG tablet Take 1 tablet (100 mg total) by mouth at bedtime. 90 tablet 0   No current facility-administered medications on file prior to visit.   Review of Systems Constitutional: Negative for  increased diaphoresis, or other activity, appetite or siginficant weight change other than noted HENT: Negative for worsening  hearing loss, ear pain, facial swelling, mouth sores and neck stiffness.   Eyes: Negative for other worsening pain, redness or visual disturbance.  Respiratory: Negative for choking or stridor Cardiovascular: Negative for other chest pain and palpitations.  Gastrointestinal: Negative for worsening diarrhea, blood in stool, or abdominal distention Genitourinary: Negative for hematuria, flank pain or change in urine volume.  Musculoskeletal: Negative for myalgias or other joint complaints.  Skin: Negative for other color change and wound or drainage.  Neurological: Negative for syncope and numbness. other than noted Hematological: Negative for adenopathy. or other swelling Psychiatric/Behavioral: Negative for hallucinations, SI, self-injury, decreased concentration or other worsening agitation.      Objective:   Physical Exam BP 120/66 mmHg  Pulse 79  Temp(Src) 98.1 F (36.7 C) (Oral)  Ht 6' 1"  (1.854 m)  Wt 284 lb (128.822 kg)  BMI 37.48 kg/m2  SpO2 97% VS noted,  Constitutional: Pt is oriented to person, place, and time. Appears well-developed and well-nourished, in no significant distress Head: Normocephalic and atraumatic  Eyes: Conjunctivae and EOM are normal. Pupils are equal, round, and reactive to light Right Ear: External ear normal.  Left Ear: External ear normal Nose: Nose normal.  Mouth/Throat: Oropharynx is clear and moist  Neck: Normal range of motion. Neck supple. No JVD present. No tracheal deviation present or significant neck LA or mass Cardiovascular: Normal rate, regular rhythm, normal heart sounds and intact distal pulses.   Pulmonary/Chest: Effort normal and breath sounds without rales or wheezing  Abdominal: Soft. Bowel sounds are normal. NT. No HSM  Musculoskeletal: Normal range of motion. Exhibits no edema Lymphadenopathy: Has no  cervical adenopathy.  Neurological: Pt is alert and oriented to person, place, and time. Pt has normal reflexes. No cranial nerve deficit. Motor grossly intact Skin: Skin is warm and dry. No rash noted or new ulcers Psychiatric:  Has normal mood and affect. Behavior is normal.  Right ankle with brace to ambulate  Most recent echo summary: 10-19-2014 Impressions:  - Mild to moderate tricuspid regurgitation, otherwise normal study.   Lab Results  Component Value Date   HGBA1C 5.4 03/12/2015       Assessment & Plan:

## 2015-11-05 NOTE — Assessment & Plan Note (Signed)
stable overall by history and exam, recent data reviewed with pt, and pt to continue medical treatment as before,  to f/u any worsening symptoms or concerns Lab Results  Component Value Date   HGBA1C 5.4 03/12/2015   For f/u lab

## 2015-11-05 NOTE — Patient Instructions (Signed)
Please take all new medication as prescribed - the nasacort  Please continue all other medications as before, and refills have been done if requested.  Please have the pharmacy call with any other refills you may need.  Please continue your efforts at being more active, low cholesterol diet, and weight control.  You are otherwise up to date with prevention measures today.  Please keep your appointments with your specialists as you may have planned  Please go to the LAB in the Basement (turn left off the elevator) for the tests to be done today  You will be contacted by phone if any changes need to be made immediately.  Otherwise, you will receive a letter about your results with an explanation, but please check with MyChart first.  Please return in 6 months, or sooner if needed, with Lab testing done 3-5 days before

## 2015-11-05 NOTE — Assessment & Plan Note (Signed)

## 2015-11-05 NOTE — Assessment & Plan Note (Signed)
stable overall by history and exam, recent data reviewed with pt, and pt to continue medical treatment as before,  to f/u any worsening symptoms or concerns BP Readings from Last 3 Encounters:  11/05/15 120/66  10/13/15 128/93  10/01/15 140/68

## 2015-11-10 ENCOUNTER — Other Ambulatory Visit (INDEPENDENT_AMBULATORY_CARE_PROVIDER_SITE_OTHER): Payer: Medicare PPO

## 2015-11-10 ENCOUNTER — Encounter (HOSPITAL_COMMUNITY): Payer: Self-pay | Admitting: Psychiatry

## 2015-11-10 ENCOUNTER — Other Ambulatory Visit: Payer: Self-pay | Admitting: Internal Medicine

## 2015-11-10 ENCOUNTER — Ambulatory Visit (INDEPENDENT_AMBULATORY_CARE_PROVIDER_SITE_OTHER): Payer: Medicare PPO | Admitting: Psychiatry

## 2015-11-10 VITALS — BP 132/90 | HR 83 | Ht 73.0 in | Wt 279.2 lb

## 2015-11-10 DIAGNOSIS — IMO0002 Reserved for concepts with insufficient information to code with codable children: Secondary | ICD-10-CM

## 2015-11-10 DIAGNOSIS — R6889 Other general symptoms and signs: Secondary | ICD-10-CM | POA: Diagnosis not present

## 2015-11-10 DIAGNOSIS — E1151 Type 2 diabetes mellitus with diabetic peripheral angiopathy without gangrene: Secondary | ICD-10-CM | POA: Diagnosis not present

## 2015-11-10 DIAGNOSIS — F319 Bipolar disorder, unspecified: Secondary | ICD-10-CM | POA: Diagnosis not present

## 2015-11-10 DIAGNOSIS — R972 Elevated prostate specific antigen [PSA]: Secondary | ICD-10-CM

## 2015-11-10 DIAGNOSIS — Z0001 Encounter for general adult medical examination with abnormal findings: Secondary | ICD-10-CM | POA: Diagnosis not present

## 2015-11-10 DIAGNOSIS — E1165 Type 2 diabetes mellitus with hyperglycemia: Secondary | ICD-10-CM | POA: Diagnosis not present

## 2015-11-10 LAB — BASIC METABOLIC PANEL
BUN: 17 mg/dL (ref 6–23)
CALCIUM: 9.1 mg/dL (ref 8.4–10.5)
CO2: 26 meq/L (ref 19–32)
Chloride: 106 mEq/L (ref 96–112)
Creatinine, Ser: 0.81 mg/dL (ref 0.40–1.50)
GFR: 102.15 mL/min (ref >60.00)
Glucose, Bld: 119 mg/dL — ABNORMAL HIGH (ref 70–99)
Potassium: 4 mEq/L (ref 3.5–5.1)
SODIUM: 140 meq/L (ref 135–145)

## 2015-11-10 LAB — CBC WITH DIFFERENTIAL/PLATELET
Basophils Absolute: 0 10*3/uL (ref 0.0–0.1)
Basophils Relative: 0.6 % (ref 0.0–3.0)
EOS PCT: 5.5 % — AB (ref 0.0–5.0)
Eosinophils Absolute: 0.3 10*3/uL (ref 0.0–0.7)
HCT: 37.6 % — ABNORMAL LOW (ref 39.0–52.0)
Hemoglobin: 12.5 g/dL — ABNORMAL LOW (ref 13.0–17.0)
LYMPHS ABS: 1.8 10*3/uL (ref 0.7–4.0)
Lymphocytes Relative: 38.5 % (ref 12.0–46.0)
MCHC: 33.3 g/dL (ref 30.0–36.0)
MCV: 86.6 fl (ref 78.0–100.0)
MONOS PCT: 9.4 % (ref 3.0–12.0)
Monocytes Absolute: 0.4 10*3/uL (ref 0.1–1.0)
NEUTROS ABS: 2.1 10*3/uL (ref 1.4–7.7)
NEUTROS PCT: 46 % (ref 43.0–77.0)
PLATELETS: 267 10*3/uL (ref 150.0–400.0)
RBC: 4.34 Mil/uL (ref 4.22–5.81)
RDW: 16.4 % — AB (ref 11.5–15.5)
WBC: 4.6 10*3/uL (ref 4.0–10.5)

## 2015-11-10 LAB — MICROALBUMIN / CREATININE URINE RATIO
CREATININE, U: 288.6 mg/dL
MICROALB UR: 12.4 mg/dL — AB (ref 0.0–1.9)
MICROALB/CREAT RATIO: 4.3 mg/g (ref 0.0–30.0)

## 2015-11-10 LAB — URINALYSIS, ROUTINE W REFLEX MICROSCOPIC
Bilirubin Urine: NEGATIVE
HGB URINE DIPSTICK: NEGATIVE
Ketones, ur: NEGATIVE
Leukocytes, UA: NEGATIVE
Nitrite: POSITIVE — AB
RBC / HPF: NONE SEEN (ref 0–?)
Specific Gravity, Urine: 1.02 (ref 1.000–1.030)
Total Protein, Urine: 30 — AB
Urine Glucose: NEGATIVE
Urobilinogen, UA: 0.2 (ref 0.0–1.0)
pH: 7 (ref 5.0–8.0)

## 2015-11-10 LAB — LIPID PANEL
Cholesterol: 109 mg/dL (ref 0–200)
HDL: 41.6 mg/dL (ref >39.00)
LDL Cholesterol: 51 mg/dL (ref 0–99)
NONHDL: 67.55
Total CHOL/HDL Ratio: 3
Triglycerides: 81 mg/dL (ref 0.0–149.0)
VLDL: 16.2 mg/dL (ref 0.0–40.0)

## 2015-11-10 LAB — HEPATIC FUNCTION PANEL
ALK PHOS: 96 U/L (ref 39–117)
ALT: 10 U/L (ref 0–53)
AST: 12 U/L (ref 0–37)
Albumin: 4.1 g/dL (ref 3.5–5.2)
BILIRUBIN DIRECT: 0.1 mg/dL (ref 0.0–0.3)
TOTAL PROTEIN: 6.9 g/dL (ref 6.0–8.3)
Total Bilirubin: 0.4 mg/dL (ref 0.2–1.2)

## 2015-11-10 LAB — HEMOGLOBIN A1C: HEMOGLOBIN A1C: 5.2 % (ref 4.6–6.5)

## 2015-11-10 LAB — PSA: PSA: 3.8 ng/mL (ref 0.10–4.00)

## 2015-11-10 LAB — TSH: TSH: 2.56 u[IU]/mL (ref 0.35–4.50)

## 2015-11-10 MED ORDER — TRAZODONE HCL 100 MG PO TABS: 100.0000 mg | ORAL_TABLET | Freq: Every day | ORAL | Status: DC

## 2015-11-10 MED ORDER — LEVOFLOXACIN 500 MG PO TABS: 500.0000 mg | ORAL_TABLET | Freq: Every day | ORAL | Status: DC

## 2015-11-10 MED ORDER — FLUOXETINE HCL 40 MG PO CAPS: 40.0000 mg | ORAL_CAPSULE | Freq: Every day | ORAL | Status: DC

## 2015-11-10 MED ORDER — ARIPIPRAZOLE 5 MG PO TABS: 5.0000 mg | ORAL_TABLET | Freq: Every day | ORAL | Status: DC
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
## 2016-01-06 ENCOUNTER — Telehealth: Payer: Self-pay | Admitting: Internal Medicine

## 2016-01-06 DIAGNOSIS — G894 Chronic pain syndrome: Secondary | ICD-10-CM

## 2016-01-06 NOTE — Telephone Encounter (Signed)
Referral done

## 2016-01-06 NOTE — Telephone Encounter (Signed)
Is requesting referral to [facility] for pain management.

## 2016-01-06 NOTE — Telephone Encounter (Signed)
Pt called to follow up on this request, please fax the referral to [phone].

## 2016-01-19 ENCOUNTER — Other Ambulatory Visit: Payer: Self-pay | Admitting: Nurse Practitioner

## 2016-01-27 ENCOUNTER — Other Ambulatory Visit: Payer: Self-pay | Admitting: Internal Medicine

## 2016-01-27 NOTE — Telephone Encounter (Signed)
Done hardcopy to Corinne  

## 2016-01-28 NOTE — Telephone Encounter (Signed)
faxed

## 2016-02-07 ENCOUNTER — Other Ambulatory Visit (HOSPITAL_COMMUNITY): Payer: Self-pay | Admitting: Psychiatry

## 2016-02-07 ENCOUNTER — Other Ambulatory Visit: Payer: Self-pay | Admitting: Cardiology

## 2016-02-07 DIAGNOSIS — F319 Bipolar disorder, unspecified: Secondary | ICD-10-CM

## 2016-02-10 ENCOUNTER — Other Ambulatory Visit (HOSPITAL_COMMUNITY): Payer: Self-pay

## 2016-02-10 DIAGNOSIS — F319 Bipolar disorder, unspecified: Secondary | ICD-10-CM

## 2016-02-10 MED ORDER — TRAZODONE HCL 100 MG PO TABS: 100.0000 mg | ORAL_TABLET | Freq: Every day | ORAL | 0 refills | Status: DC

## 2016-02-11 ENCOUNTER — Ambulatory Visit (HOSPITAL_COMMUNITY): Payer: Self-pay | Admitting: Psychiatry

## 2016-02-17 ENCOUNTER — Ambulatory Visit: Payer: Medicare PPO | Admitting: Psychology

## 2016-02-24 ENCOUNTER — Ambulatory Visit (INDEPENDENT_AMBULATORY_CARE_PROVIDER_SITE_OTHER): Payer: Medicare PPO | Admitting: Orthopedic Surgery

## 2016-03-29 ENCOUNTER — Other Ambulatory Visit (HOSPITAL_COMMUNITY): Payer: Self-pay | Admitting: Psychiatry

## 2016-03-29 DIAGNOSIS — F319 Bipolar disorder, unspecified: Secondary | ICD-10-CM

## 2016-03-31 ENCOUNTER — Other Ambulatory Visit (HOSPITAL_COMMUNITY): Payer: Self-pay | Admitting: Psychiatry

## 2016-03-31 DIAGNOSIS — F319 Bipolar disorder, unspecified: Secondary | ICD-10-CM

## 2016-04-11 ENCOUNTER — Ambulatory Visit (INDEPENDENT_AMBULATORY_CARE_PROVIDER_SITE_OTHER): Payer: Medicare PPO

## 2016-04-11 ENCOUNTER — Other Ambulatory Visit: Payer: Self-pay | Admitting: Nurse Practitioner

## 2016-04-11 ENCOUNTER — Telehealth (INDEPENDENT_AMBULATORY_CARE_PROVIDER_SITE_OTHER): Payer: Self-pay | Admitting: Specialist

## 2016-04-11 ENCOUNTER — Ambulatory Visit (INDEPENDENT_AMBULATORY_CARE_PROVIDER_SITE_OTHER): Payer: Medicare PPO | Admitting: Orthopedic Surgery

## 2016-04-11 DIAGNOSIS — E11610 Type 2 diabetes mellitus with diabetic neuropathic arthropathy: Secondary | ICD-10-CM | POA: Diagnosis not present

## 2016-04-11 DIAGNOSIS — M25571 Pain in right ankle and joints of right foot: Secondary | ICD-10-CM | POA: Diagnosis not present

## 2016-04-11 DIAGNOSIS — F319 Bipolar disorder, unspecified: Secondary | ICD-10-CM

## 2016-04-11 DIAGNOSIS — T84116S Breakdown (mechanical) of internal fixation device of bone of right lower leg, sequela: Secondary | ICD-10-CM

## 2016-04-11 DIAGNOSIS — T84116A Breakdown (mechanical) of internal fixation device of bone of right lower leg, initial encounter: Secondary | ICD-10-CM | POA: Insufficient documentation

## 2016-04-11 NOTE — Telephone Encounter (Signed)
Patient is calling to check on her referral to pain management  Cb#: [phone]

## 2016-04-11 NOTE — Telephone Encounter (Signed)
PLEASE DISREGARD LAST MSG, *WRONG PATIENT*

## 2016-04-11 NOTE — Progress Notes (Signed)
Office Visit Note   Patient: Travis Hansley Sr.           Date of Birth: [birth year]           MRN: [id number] Visit Date: 04/11/2016              Requested by: [name], MD [place], [place] [address] PCP: [name], MD   Assessment & Plan: Visit Diagnoses:  1. Charcot's arthropathy associated with type 2 diabetes mellitus ([place])   2. Mechanical breakdown of internal fixation device of bone of right lower leg, sequela   3. Pain in right ankle and joints of right foot     Plan: Patient has had dramatic failure of the internal fixation for his right ankle fracture secondary to Charcot arthropathy with diabetic insensate neuropathy. We will plan for surgical intervention for realignment of the ankle with removal the deep retained hardware tibial calcaneal fusion. Risk and benefits were discussed including infection neurovascular injury difficulty wound healing need for additional surgery. Patient states he understands wishes to proceed at this time.  Follow-Up Instructions: Return in about 2 weeks (around 04/25/2016).   Orders:  Orders Placed This Encounter  Procedures  . XR Ankle Complete Right   No orders of the defined types were placed in this encounter.     Procedures: No procedures performed   Clinical Data: No additional findings.   Subjective: Chief Complaint  Patient presents with  . Right Ankle - Wound Check    S/p dislocation and internal fixation     Pt is s/p a a open reduction internal fixation of a dislocated right ankle and application of skin graft. Pt states that that his foot and ankle have looked this way since his last visit. The ankle is completley rolled over there is an open area at the medial side. Pt is in sandals and there is no support of the ankle. He is wearing a vive compression sock and he is not applying anything to the area. He having pain and ambulates with a cane. He is limping.    Review of  Systems   Objective: Vital Signs: There were no vitals taken for this visit.  Physical Exam on examination patient is alert oriented no adenopathy well-dressed normal affect numerous 20 effort he does have an antalgic gait with his right foot externally rotated approximately 70 and 45 of valgus. Patient has a good dorsalis pedis pulse there is no cellulitis no signs of infection he has some ischemic skin changes laterally over the medial malleolus with the medial malleolus is prominent. There is no open wounds no signs of infection. The Charcot arthropathy is stable with no acute arthropathy process at this time. Patient states that his sugars have been running low with no problems with his hemoglobin A1c either.  Ortho Exam  Specialty Comments:  No specialty comments available.  Imaging: Xr Ankle Complete Right  Result Date: 04/11/2016 Three-view radiographs of the right ankle shows Charcot collapse with dislocation of the right ankle with failure of the retained hardware. Patient has valgus alignment of the ankle and external rotation of the foot.    PMFS History: Patient Active Problem List   Diagnosis Date Noted  . Charcot's arthropathy associated with type 2 diabetes mellitus ([place]) 04/11/2016  . Mechanical breakdown of internal fixation device of bone of right lower leg ([place]) 04/11/2016  . Encounter for well adult exam with abnormal findings 11/05/2015  . Major depression 09/13/2015  .  S/P TKR (total knee replacement) bilaterally 09/13/2015  . GERD (gastroesophageal reflux disease) 09/08/2015  . Open ankle fracture 09/02/2015  . Fracture dislocation of ankle joint 09/02/2015  . S/P laparoscopic hernia repair 05/11/2015  . PPD positive 04/08/2015  . Benign neoplasm of descending colon   . Benign neoplasm of cecum   . AKI (acute kidney injury) ([place]) 10/18/2014  . Acute blood loss anemia   . Chronic anticoagulation   . Closed fracture of right foot 10/17/2014  . Occult  blood positive stool 10/17/2014  . General weakness 07/14/2014  . Urinary incontinence 07/14/2014  . Arthritis of foot, right, degenerative 04/15/2014  . Headache(784.0) 10/15/2013  . Uncontrolled type 2 DM with peripheral circulatory disorder ([place]) 10/04/2013  . Spinal stenosis in cervical region 09/26/2013    Class: Chronic  . Spinal stenosis, lumbar region, with neurogenic claudication 09/26/2013    Class: Chronic  . Hand joint pain 06/10/2013  . Rotator cuff tear arthropathy of both shoulders 06/10/2013  . Skin lesion of cheek 05/01/2013  . Pain of right thumb 04/03/2013  . Balance disorder 03/12/2013  . Gait disorder 03/12/2013  . Tremor 03/12/2013  . Left hip pain 03/12/2013  . Pre-ulcerative corn or callous 02/06/2013  . Anxiety 11/12/2011  . OSA (obstructive sleep apnea) 11/07/2011  . Bradycardia 10/20/2011  . Insomnia 10/04/2011  . Obesity 01/12/2011  . ERECTILE DYSFUNCTION, ORGANIC 05/30/2010  . Martin DISEASE, LUMBAR 04/19/2010  . SCIATICA, LEFT 04/19/2010  . Chronic pain syndrome 10/27/2009  . Hyperlipidemia 07/15/2009  . Essential hypertension 06/24/2009  . CAD (coronary artery disease) 06/24/2009  . Allergic rhinitis 06/24/2009  . URETHRAL STRICTURE 06/24/2009  . DEGENERATIVE JOINT DISEASE 06/24/2009  . SHOULDER PAIN, BILATERAL 06/24/2009  . FATIGUE 06/24/2009  . NEPHROLITHIASIS, HX OF 06/24/2009   Past Medical History:  Diagnosis Date  . ALLERGIC RHINITIS 06/24/2009  . Allergic rhinitis 06/24/2009   Qualifier: Diagnosis of  By: [name], [name]   . Anxiety 11/12/2011   Adequate for discharge   . Arthritis    "all my joints" (09/30/2013)  . Arthritis of foot, right, degenerative 04/15/2014  . Balance disorder 03/12/2013  . Benign neoplasm of cecum   . Benign neoplasm of descending colon   . CAD (coronary artery disease) 06/24/2009   5 stents placed in 2007    . Chronic anticoagulation   . Chronic pain syndrome 10/27/2009   of ankle, shoulders, low back.   sciatica.   . Closed fracture of right foot 10/17/2014  . CORONARY ARTERY DISEASE 06/24/2009   a. s/p multiple PCIs - In 2008 he had a Taxus DES to the mild LAD, Endeavor DES to mid LCX and distal LCX. In January 2009 he had DES to distal LCX, mid LCX and proximal LCX. In November 2009 had BMS x 2 to the mid RCA. Cath 10/2011 with patent stents, noncardiac CP. LHC 01/2013: patent stents (noncardiac CP).  . DEGENERATIVE JOINT DISEASE 06/24/2009   Qualifier: Diagnosis of  By: [name], [name]   . Depression   . Depression with anxiety    Prior suicide attempt  . [place]ell DISEASE, LUMBAR 04/19/2010  . ERECTILE DYSFUNCTION, ORGANIC 05/30/2010  . Essential hypertension 06/24/2009   Qualifier: Diagnosis of  By: [name], [name]   . Fibromyalgia   . Fracture dislocation of ankle joint 09/02/2015  . Gait disorder 03/12/2013  . General weakness 07/14/2014  . GERD (gastroesophageal reflux disease) 09/08/2015  . Hand joint pain 06/10/2013  . Heart murmur   .  History of kidney stones   . Hyperlipidemia 07/15/2009   Qualifier: Diagnosis of  By: Aundra Dubin, MD, [place]    . HYPERLIPIDEMIA-MIXED 07/15/2009  . HYPERTENSION 06/24/2009  . Insomnia 10/04/2011  . Irregular heart beat   . Left hip pain 03/12/2013   Injected under ultrasound guidance on June 24, 2013   . Major depression 09/13/2015  . Myocardial infarction 2008  . NEPHROLITHIASIS, HX OF 06/24/2009  . Non-cardiac chest pain 10/2011, 01/2013  . Obesity   . Occult blood positive stool 10/17/2014  . Open ankle fracture 09/02/2015  . OSA (obstructive sleep apnea)    not using CPAP (09/30/2013)  . Pain of right thumb 04/03/2013  . Pneumonia   . PPD positive 04/08/2015  . Pre-ulcerative corn or callous 02/06/2013  . Rotator cuff tear arthropathy of both shoulders 06/10/2013   History of bilateral shoulder cuff surgery for rotator cuff tears. Reports increase in pain 09/11/2015 during physical therapy of the left shoulder.   . S/P laparoscopic hernia repair 05/11/2015   . S/P TKR (total knee replacement) bilaterally 09/13/2015   2007   . SCIATICA, LEFT 04/19/2010   Qualifier: Diagnosis of  By: [name], [name]   . Spinal stenosis in cervical region 09/26/2013  . Spinal stenosis, lumbar region, with neurogenic claudication 09/26/2013  . Type II diabetes mellitus ([place]) 2012   no meds in 09/2014.   [name] Uncontrolled type 2 DM with peripheral circulatory disorder ([place]) 10/04/2013  . URETHRAL STRICTURE 06/24/2009   self catheterizes.     Family History  Problem Relation Age of Onset  . Depression Mother   . Heart disease Mother   . Hypertension Mother   . Cancer Mother     Breast  . Diabetes Father   . Heart disease Father     CABG  . Cancer Father     prostate and skin cancer  . Hypertension Father   . Hyperlipidemia Father   . Depression Brother     x 2  . Hypertension Brother     x2  . Coronary artery disease Other   . Hypertension Other   . Depression Other   . Heart disease Maternal Grandfather   . Early death Maternal Grandfather 41    heart attack  . Early death Paternal Grandfather     Past Surgical History:  Procedure Laterality Date  . ANKLE FUSION Right 04/15/2014   Procedure: Right Subtalar, Talonavicular Fusion;  Surgeon: Newt Minion, MD;  Location: [place];  Service: Orthopedics;  Laterality: Right;  . ANTERIOR CERVICAL DECOMP/DISCECTOMY FUSION N/A 09/26/2013   Procedure: ANTERIOR CERVICAL DISCECTOMY FUSION C3-4, plate and screw fixation, allograft bone graft;  Surgeon: Jessy Oto, MD;  Location: [place];  Service: Orthopedics;  Laterality: N/A;  . CARDIAC CATHETERIZATION  X 1  . CARPAL TUNNEL RELEASE Bilateral   . COLONOSCOPY    . COLONOSCOPY N/A 10/22/2014   Procedure: COLONOSCOPY;  Surgeon: Lafayette Dragon, MD;  [facility] ENDOSCOPY;  Service: Endoscopy;  Laterality: N/A;  . CORONARY ANGIOPLASTY WITH STENT PLACEMENT     "I have 9 stents"  . ESOPHAGOGASTRODUODENOSCOPY N/A 10/19/2014   Procedure: ESOPHAGOGASTRODUODENOSCOPY (EGD);   Surgeon: Jerene Bears, MD;  Location: [facility] ENDOSCOPY;  Service: Endoscopy;  Laterality: N/A;  . FUSION OF TALONAVICULAR JOINT Right 04/15/2014   dr Phill Steck  . HERNIA REPAIR     umbilical  . INGUINAL HERNIA REPAIR  05/11/2015  . INGUINAL HERNIA REPAIR Right 05/11/2015   Procedure: LAPAROSCOPIC REPAIR RIGHT  INGUINAL HERNIA;  Surgeon: Greer Pickerel, MD;  Location: [place];  Service: General;  Laterality: Right;  . INSERTION OF MESH Right 05/11/2015   Procedure: INSERTION OF MESH;  Surgeon: Greer Pickerel, MD;  Location: [place];  Service: General;  Laterality: Right;  . JOINT REPLACEMENT Bilateral    knees  . KNEE ARTHROSCOPY Right X 7  . KNEE CARTILAGE SURGERY Right X 4   "open; not scopes"  . KNEE CARTILAGE SURGERY Left X 3   "open; not scopes"  . LAMINECTOMY  01/28/2014   L3 L4 L5  . LEFT HEART CATHETERIZATION WITH CORONARY ANGIOGRAM N/A 02/10/2013   Procedure: LEFT HEART CATHETERIZATION WITH CORONARY ANGIOGRAM;  Surgeon: Burnell Blanks, MD;  Location: [facility] CATH LAB;  Service: Cardiovascular;  Laterality: N/A;  . LUMBAR LAMINECTOMY/DECOMPRESSION MICRODISCECTOMY N/A 01/27/2014   Procedure: CENTRAL LUMBAR LAMINECTOMY L4-5 AND L3-4;  Surgeon: Jessy Oto, MD;  Location: [place];  Service: Orthopedics;  Laterality: N/A;  . ORIF ANKLE FRACTURE Right 09/02/2015  . ORIF ANKLE FRACTURE Right 09/02/2015   Procedure: OPEN REDUCTION INTERNAL FIXATION (ORIF) ANKLE FRACTURE;  Surgeon: Newt Minion, MD;  Location: [place];  Service: Orthopedics;  Laterality: Right;  . PERIPHERALLY INSERTED CENTRAL CATHETER INSERTION  09/02/2015  . SHOULDER ARTHROSCOPY W/ ROTATOR CUFF REPAIR Bilateral    "3on the left; 2 on the right"  . SHOULDER OPEN ROTATOR CUFF REPAIR Right X 2  . SKIN SPLIT GRAFT Right 10/01/2015   Procedure: RIGHT ANKLE APPLY SKIN GRAFT SPLIT THICKNESS;  Surgeon: Newt Minion, MD;  Location: [place];  Service: Orthopedics;  Laterality: Right;  . TONSILLECTOMY    . TOTAL KNEE ARTHROPLASTY Bilateral 2008  .  UMBILICAL HERNIA REPAIR     UHR  . URETHRAL DILATION  X 4  . VASECTOMY     Social History   Occupational History  . disabled since 2006 due to ortho. heart, psych Unemployed  . part time work as an Multimedia programmer, wrestling, and baseball Agricultural consultant    Social History Main Topics  . Smoking status: Former Smoker    Types: Cigars    Quit date: 08/28/2010  . Smokeless tobacco: Never Used     Comment: 09/30/2013 "smoked 1 cigar/wk when I did smoke"  . Alcohol use No     Comment: rare (4 beers all summer)  None at all now (03/2014)  . Drug use: No  . Sexual activity: Yes

## 2016-04-12 NOTE — Telephone Encounter (Signed)
Message taken on wrong patient

## 2016-04-17 ENCOUNTER — Encounter (HOSPITAL_COMMUNITY): Payer: Self-pay | Admitting: *Deleted

## 2016-04-17 NOTE — Progress Notes (Signed)
Spoke with pt for pre-op call. Pt has extensive cardiac and medical history. Dr. Aundra Dubin is his cardiologist, last OV was August 2016. Pt is on Effient and Aspirin, when asked if he was instructed to stop those, he said no, but he stopped on his own. Last dose for both were 04/11/16. Pt denies any recent chest pain or sob. Pt is diabetic, states his fasting blood sugar is usually in the low 100's. Last A1C was 5.2 on 11/10/15.

## 2016-04-18 ENCOUNTER — Other Ambulatory Visit (INDEPENDENT_AMBULATORY_CARE_PROVIDER_SITE_OTHER): Payer: Self-pay | Admitting: Family

## 2016-04-18 ENCOUNTER — Encounter (HOSPITAL_COMMUNITY): Payer: Self-pay | Admitting: Anesthesiology

## 2016-04-18 ENCOUNTER — Encounter (HOSPITAL_COMMUNITY)
Admission: RE | Disposition: A | Discharge status: Skilled Nursing Facility | Payer: Self-pay | Source: Ambulatory Visit | Attending: Orthopedic Surgery

## 2016-04-18 ENCOUNTER — Inpatient Hospital Stay (HOSPITAL_COMMUNITY): Payer: Medicare PPO | Admitting: Anesthesiology

## 2016-04-18 ENCOUNTER — Ambulatory Visit (HOSPITAL_COMMUNITY): Payer: Self-pay | Admitting: Psychiatry

## 2016-04-18 ENCOUNTER — Inpatient Hospital Stay (HOSPITAL_COMMUNITY)
Admission: RE | Admit: 2016-04-18 | Discharge: 2016-04-21 | DRG: 493 | Disposition: A | Discharge status: Skilled Nursing Facility | Payer: Medicare PPO | Source: Ambulatory Visit | Attending: Orthopedic Surgery | Admitting: Orthopedic Surgery

## 2016-04-18 DIAGNOSIS — I2510 Atherosclerotic heart disease of native coronary artery without angina pectoris: Secondary | ICD-10-CM | POA: Diagnosis present

## 2016-04-18 DIAGNOSIS — Y798 Miscellaneous orthopedic devices associated with adverse incidents, not elsewhere classified: Secondary | ICD-10-CM | POA: Diagnosis present

## 2016-04-18 DIAGNOSIS — Z833 Family history of diabetes mellitus: Secondary | ICD-10-CM

## 2016-04-18 DIAGNOSIS — T84197A Other mechanical complication of internal fixation device of bone of left lower leg, initial encounter: Secondary | ICD-10-CM | POA: Diagnosis present

## 2016-04-18 DIAGNOSIS — Z6838 Body mass index (BMI) 38.0-38.9, adult: Secondary | ICD-10-CM

## 2016-04-18 DIAGNOSIS — N529 Male erectile dysfunction, unspecified: Secondary | ICD-10-CM | POA: Diagnosis present

## 2016-04-18 DIAGNOSIS — I10 Essential (primary) hypertension: Secondary | ICD-10-CM | POA: Diagnosis present

## 2016-04-18 DIAGNOSIS — Z96653 Presence of artificial knee joint, bilateral: Secondary | ICD-10-CM | POA: Diagnosis present

## 2016-04-18 DIAGNOSIS — E669 Obesity, unspecified: Secondary | ICD-10-CM | POA: Diagnosis present

## 2016-04-18 DIAGNOSIS — E1140 Type 2 diabetes mellitus with diabetic neuropathy, unspecified: Secondary | ICD-10-CM | POA: Diagnosis present

## 2016-04-18 DIAGNOSIS — Z87442 Personal history of urinary calculi: Secondary | ICD-10-CM

## 2016-04-18 DIAGNOSIS — G2581 Restless legs syndrome: Secondary | ICD-10-CM | POA: Diagnosis present

## 2016-04-18 DIAGNOSIS — A5216 Charcot's arthropathy (tabetic): Secondary | ICD-10-CM | POA: Diagnosis present

## 2016-04-18 DIAGNOSIS — M797 Fibromyalgia: Secondary | ICD-10-CM | POA: Diagnosis present

## 2016-04-18 DIAGNOSIS — E785 Hyperlipidemia, unspecified: Secondary | ICD-10-CM | POA: Diagnosis present

## 2016-04-18 DIAGNOSIS — G894 Chronic pain syndrome: Secondary | ICD-10-CM | POA: Diagnosis present

## 2016-04-18 DIAGNOSIS — M25571 Pain in right ankle and joints of right foot: Secondary | ICD-10-CM | POA: Diagnosis present

## 2016-04-18 DIAGNOSIS — Z915 Personal history of self-harm: Secondary | ICD-10-CM

## 2016-04-18 DIAGNOSIS — G4733 Obstructive sleep apnea (adult) (pediatric): Secondary | ICD-10-CM | POA: Diagnosis present

## 2016-04-18 DIAGNOSIS — I252 Old myocardial infarction: Secondary | ICD-10-CM

## 2016-04-18 DIAGNOSIS — Z23 Encounter for immunization: Secondary | ICD-10-CM

## 2016-04-18 DIAGNOSIS — F419 Anxiety disorder, unspecified: Secondary | ICD-10-CM | POA: Diagnosis present

## 2016-04-18 DIAGNOSIS — F329 Major depressive disorder, single episode, unspecified: Secondary | ICD-10-CM | POA: Diagnosis present

## 2016-04-18 DIAGNOSIS — M14671 Charcot's joint, right ankle and foot: Secondary | ICD-10-CM

## 2016-04-18 DIAGNOSIS — Z981 Arthrodesis status: Secondary | ICD-10-CM

## 2016-04-18 DIAGNOSIS — Z7951 Long term (current) use of inhaled steroids: Secondary | ICD-10-CM

## 2016-04-18 DIAGNOSIS — Z79899 Other long term (current) drug therapy: Secondary | ICD-10-CM

## 2016-04-18 DIAGNOSIS — Z7984 Long term (current) use of oral hypoglycemic drugs: Secondary | ICD-10-CM

## 2016-04-18 DIAGNOSIS — K219 Gastro-esophageal reflux disease without esophagitis: Secondary | ICD-10-CM | POA: Diagnosis present

## 2016-04-18 DIAGNOSIS — E1151 Type 2 diabetes mellitus with diabetic peripheral angiopathy without gangrene: Secondary | ICD-10-CM | POA: Diagnosis present

## 2016-04-18 DIAGNOSIS — Z8249 Family history of ischemic heart disease and other diseases of the circulatory system: Secondary | ICD-10-CM

## 2016-04-18 DIAGNOSIS — M19071 Primary osteoarthritis, right ankle and foot: Secondary | ICD-10-CM | POA: Diagnosis present

## 2016-04-18 DIAGNOSIS — Z79891 Long term (current) use of opiate analgesic: Secondary | ICD-10-CM

## 2016-04-18 DIAGNOSIS — Z87891 Personal history of nicotine dependence: Secondary | ICD-10-CM

## 2016-04-18 DIAGNOSIS — Z818 Family history of other mental and behavioral disorders: Secondary | ICD-10-CM

## 2016-04-18 DIAGNOSIS — Z7901 Long term (current) use of anticoagulants: Secondary | ICD-10-CM

## 2016-04-18 DIAGNOSIS — E11610 Type 2 diabetes mellitus with diabetic neuropathic arthropathy: Secondary | ICD-10-CM | POA: Diagnosis not present

## 2016-04-18 DIAGNOSIS — Z7982 Long term (current) use of aspirin: Secondary | ICD-10-CM

## 2016-04-18 HISTORY — PX: ANKLE FUSION: SHX5718

## 2016-04-18 LAB — GLUCOSE, CAPILLARY
GLUCOSE-CAPILLARY: 105 mg/dL — AB (ref 65–99)
GLUCOSE-CAPILLARY: 119 mg/dL — AB (ref 65–99)
GLUCOSE-CAPILLARY: 94 mg/dL (ref 65–99)
Glucose-Capillary: 149 mg/dL — ABNORMAL HIGH (ref 65–99)

## 2016-04-18 LAB — COMPREHENSIVE METABOLIC PANEL
ALBUMIN: 4.1 g/dL (ref 3.5–5.0)
ALT: 15 U/L — ABNORMAL LOW (ref 17–63)
ANION GAP: 8 (ref 5–15)
AST: 17 U/L (ref 15–41)
Alkaline Phosphatase: 117 U/L (ref 38–126)
BUN: 21 mg/dL — ABNORMAL HIGH (ref 6–20)
CO2: 19 mmol/L — AB (ref 22–32)
Calcium: 9.2 mg/dL (ref 8.9–10.3)
Chloride: 110 mmol/L (ref 101–111)
Creatinine, Ser: 1.07 mg/dL (ref 0.61–1.24)
GFR calc non Af Amer: >60 mL/min (ref >60)
GLUCOSE: 107 mg/dL — AB (ref 65–99)
POTASSIUM: 4.3 mmol/L (ref 3.5–5.1)
SODIUM: 137 mmol/L (ref 135–145)
TOTAL PROTEIN: 7.3 g/dL (ref 6.5–8.1)
Total Bilirubin: 0.4 mg/dL (ref 0.3–1.2)

## 2016-04-18 LAB — CBC
HEMATOCRIT: 39.6 % (ref 39.0–52.0)
HEMOGLOBIN: 13.3 g/dL (ref 13.0–17.0)
MCH: 29.3 pg (ref 26.0–34.0)
MCHC: 33.6 g/dL (ref 30.0–36.0)
MCV: 87.2 fL (ref 78.0–100.0)
Platelets: 255 10*3/uL (ref 150–400)
RBC: 4.54 MIL/uL (ref 4.22–5.81)
RDW: 14.3 % (ref 11.5–15.5)
WBC: 5.4 10*3/uL (ref 4.0–10.5)

## 2016-04-18 LAB — POCT I-STAT 4, (NA,K, GLUC, HGB,HCT)
GLUCOSE: 135 mg/dL — AB (ref 65–99)
HEMATOCRIT: 33 % — AB (ref 39.0–52.0)
HEMOGLOBIN: 11.2 g/dL — AB (ref 13.0–17.0)
POTASSIUM: 4 mmol/L (ref 3.5–5.1)
SODIUM: 141 mmol/L (ref 135–145)

## 2016-04-18 SURGERY — ANKLE FUSION
Anesthesia: General | Site: Ankle | Laterality: Right

## 2016-04-18 MED ORDER — FENTANYL CITRATE (PF) 100 MCG/2ML IJ SOLN
INTRAMUSCULAR | Status: AC
  Administered 2016-04-18: 50 ug via INTRAVENOUS
  Filled 2016-04-18: qty 2

## 2016-04-18 MED ORDER — CEFAZOLIN SODIUM-DEXTROSE 2-4 GM/100ML-% IV SOLN
2.0000 g | INTRAVENOUS | Status: AC
  Administered 2016-04-18: 2 g via INTRAVENOUS
  Filled 2016-04-18: qty 100

## 2016-04-18 MED ORDER — LIDOCAINE HCL (CARDIAC) 20 MG/ML IV SOLN
INTRAVENOUS | Status: DC | PRN
  Administered 2016-04-18: 80 mg via INTRAVENOUS

## 2016-04-18 MED ORDER — ATORVASTATIN CALCIUM 20 MG PO TABS
20.0000 mg | ORAL_TABLET | Freq: Every day | ORAL | Status: DC
  Administered 2016-04-18 – 2016-04-21 (×4): 20 mg via ORAL
  Filled 2016-04-18 (×4): qty 1

## 2016-04-18 MED ORDER — POLYETHYLENE GLYCOL 3350 17 G PO PACK: 17.0000 g | PACK | Freq: Every day | ORAL | Status: DC | PRN

## 2016-04-18 MED ORDER — MIDAZOLAM HCL 5 MG/5ML IJ SOLN
INTRAMUSCULAR | Status: DC | PRN
  Administered 2016-04-18: 2 mg via INTRAVENOUS

## 2016-04-18 MED ORDER — METOCLOPRAMIDE HCL 5 MG/ML IJ SOLN: 10.0000 mg | Freq: Once | INTRAMUSCULAR | Status: DC | PRN

## 2016-04-18 MED ORDER — ASPIRIN EC 81 MG PO TBEC
81.0000 mg | DELAYED_RELEASE_TABLET | Freq: Every day | ORAL | Status: DC
  Administered 2016-04-18 – 2016-04-21 (×4): 81 mg via ORAL
  Filled 2016-04-18 (×4): qty 1

## 2016-04-18 MED ORDER — OXYCODONE HCL 5 MG PO TABS
5.0000 mg | ORAL_TABLET | ORAL | Status: DC | PRN
  Administered 2016-04-18 – 2016-04-21 (×16): 10 mg via ORAL
  Filled 2016-04-18 (×15): qty 2

## 2016-04-18 MED ORDER — MIDAZOLAM HCL 2 MG/2ML IJ SOLN
INTRAMUSCULAR | Status: AC
  Filled 2016-04-18: qty 2

## 2016-04-18 MED ORDER — LACTATED RINGERS IV SOLN
INTRAVENOUS | Status: DC
  Administered 2016-04-18 (×3): via INTRAVENOUS

## 2016-04-18 MED ORDER — EPHEDRINE SULFATE 50 MG/ML IJ SOLN
INTRAMUSCULAR | Status: DC | PRN
  Administered 2016-04-18 (×2): 5 mg via INTRAVENOUS

## 2016-04-18 MED ORDER — METHOCARBAMOL 1000 MG/10ML IJ SOLN: 500.0000 mg | Freq: Four times a day (QID) | INTRAVENOUS | Status: DC | PRN

## 2016-04-18 MED ORDER — 0.9 % SODIUM CHLORIDE (POUR BTL) OPTIME
TOPICAL | Status: DC | PRN
  Administered 2016-04-18: 1000 mL

## 2016-04-18 MED ORDER — ONDANSETRON HCL 4 MG/2ML IJ SOLN: 4.0000 mg | Freq: Four times a day (QID) | INTRAMUSCULAR | Status: DC | PRN

## 2016-04-18 MED ORDER — ALBUMIN HUMAN 5 % IV SOLN
INTRAVENOUS | Status: DC | PRN
  Administered 2016-04-18: 13:00:00 via INTRAVENOUS

## 2016-04-18 MED ORDER — TRANEXAMIC ACID 1000 MG/10ML IV SOLN
2000.0000 mg | Freq: Once | INTRAVENOUS | Status: DC
  Filled 2016-04-18: qty 20

## 2016-04-18 MED ORDER — BISACODYL 5 MG PO TBEC: 5.0000 mg | DELAYED_RELEASE_TABLET | Freq: Every day | ORAL | Status: DC | PRN

## 2016-04-18 MED ORDER — CEFAZOLIN SODIUM-DEXTROSE 2-4 GM/100ML-% IV SOLN
2.0000 g | Freq: Four times a day (QID) | INTRAVENOUS | Status: AC
  Administered 2016-04-18 – 2016-04-19 (×3): 2 g via INTRAVENOUS
  Filled 2016-04-18 (×3): qty 100

## 2016-04-18 MED ORDER — INFLUENZA VAC SPLIT QUAD 0.5 ML IM SUSY
0.5000 mL | PREFILLED_SYRINGE | INTRAMUSCULAR | Status: AC
  Administered 2016-04-19: 0.5 mL via INTRAMUSCULAR
  Filled 2016-04-18: qty 0.5

## 2016-04-18 MED ORDER — HYDROMORPHONE HCL 2 MG/ML IJ SOLN
1.0000 mg | INTRAMUSCULAR | Status: DC | PRN
  Administered 2016-04-18 – 2016-04-21 (×8): 1 mg via INTRAVENOUS
  Filled 2016-04-18 (×8): qty 1

## 2016-04-18 MED ORDER — CHLORHEXIDINE GLUCONATE 4 % EX LIQD: 60.0000 mL | Freq: Once | CUTANEOUS | Status: DC

## 2016-04-18 MED ORDER — METHOCARBAMOL 500 MG PO TABS
500.0000 mg | ORAL_TABLET | Freq: Four times a day (QID) | ORAL | Status: DC | PRN
  Administered 2016-04-18 – 2016-04-21 (×7): 500 mg via ORAL
  Filled 2016-04-18 (×7): qty 1

## 2016-04-18 MED ORDER — CEFAZOLIN SODIUM 1 G IJ SOLR
INTRAMUSCULAR | Status: DC | PRN
  Administered 2016-04-18: 1 g via INTRAMUSCULAR

## 2016-04-18 MED ORDER — TAMSULOSIN HCL 0.4 MG PO CAPS
0.4000 mg | ORAL_CAPSULE | Freq: Every day | ORAL | Status: DC
  Administered 2016-04-19 – 2016-04-21 (×3): 0.4 mg via ORAL
  Filled 2016-04-18 (×3): qty 1

## 2016-04-18 MED ORDER — ARIPIPRAZOLE 10 MG PO TABS
5.0000 mg | ORAL_TABLET | Freq: Every day | ORAL | Status: DC
  Administered 2016-04-18 – 2016-04-20 (×3): 5 mg via ORAL
  Filled 2016-04-18 (×3): qty 1

## 2016-04-18 MED ORDER — FLUOXETINE HCL 20 MG PO CAPS
40.0000 mg | ORAL_CAPSULE | Freq: Every day | ORAL | Status: DC
  Administered 2016-04-19 – 2016-04-21 (×3): 40 mg via ORAL
  Filled 2016-04-18 (×3): qty 2

## 2016-04-18 MED ORDER — FENTANYL CITRATE (PF) 100 MCG/2ML IJ SOLN
25.0000 ug | INTRAMUSCULAR | Status: DC | PRN
  Administered 2016-04-18 (×2): 50 ug via INTRAVENOUS

## 2016-04-18 MED ORDER — OXYCODONE HCL 5 MG PO TABS
ORAL_TABLET | ORAL | Status: AC
Start: 2016-04-18 — End: 2016-04-19
  Filled 2016-04-18: qty 2

## 2016-04-18 MED ORDER — PHENYLEPHRINE HCL 10 MG/ML IJ SOLN
INTRAMUSCULAR | Status: DC | PRN
  Administered 2016-04-18: 40 ug via INTRAVENOUS
  Administered 2016-04-18: 80 ug via INTRAVENOUS

## 2016-04-18 MED ORDER — BUPIVACAINE-EPINEPHRINE (PF) 0.5% -1:200000 IJ SOLN
INTRAMUSCULAR | Status: DC | PRN
  Administered 2016-04-18: 30 mL via PERINEURAL

## 2016-04-18 MED ORDER — FENTANYL CITRATE (PF) 100 MCG/2ML IJ SOLN
INTRAMUSCULAR | Status: DC | PRN
  Administered 2016-04-18: 100 ug via INTRAVENOUS

## 2016-04-18 MED ORDER — FENTANYL CITRATE (PF) 250 MCG/5ML IJ SOLN
INTRAMUSCULAR | Status: AC
  Filled 2016-04-18: qty 5

## 2016-04-18 MED ORDER — METFORMIN HCL 500 MG PO TABS
500.0000 mg | ORAL_TABLET | Freq: Two times a day (BID) | ORAL | Status: DC
  Administered 2016-04-19 – 2016-04-21 (×5): 500 mg via ORAL
  Filled 2016-04-18 (×5): qty 1

## 2016-04-18 MED ORDER — MIDAZOLAM HCL 2 MG/2ML IJ SOLN
2.0000 mg | Freq: Once | INTRAMUSCULAR | Status: AC
  Administered 2016-04-18: 2 mg via INTRAVENOUS

## 2016-04-18 MED ORDER — SODIUM CHLORIDE 0.9 % IV SOLN
INTRAVENOUS | Status: DC
  Administered 2016-04-18: 18:00:00 via INTRAVENOUS

## 2016-04-18 MED ORDER — PREGABALIN 50 MG PO CAPS
100.0000 mg | ORAL_CAPSULE | Freq: Three times a day (TID) | ORAL | Status: DC
  Administered 2016-04-18 – 2016-04-21 (×9): 100 mg via ORAL
  Filled 2016-04-18 (×9): qty 2

## 2016-04-18 MED ORDER — DOCUSATE SODIUM 100 MG PO CAPS
100.0000 mg | ORAL_CAPSULE | Freq: Two times a day (BID) | ORAL | Status: DC
  Administered 2016-04-18 – 2016-04-21 (×5): 100 mg via ORAL
  Filled 2016-04-18 (×6): qty 1

## 2016-04-18 MED ORDER — ACETAMINOPHEN 325 MG PO TABS
650.0000 mg | ORAL_TABLET | Freq: Four times a day (QID) | ORAL | Status: DC | PRN
  Administered 2016-04-19: 650 mg via ORAL
  Filled 2016-04-18: qty 2

## 2016-04-18 MED ORDER — METOCLOPRAMIDE HCL 5 MG PO TABS: 5.0000 mg | ORAL_TABLET | Freq: Three times a day (TID) | ORAL | Status: DC | PRN

## 2016-04-18 MED ORDER — FENTANYL CITRATE (PF) 100 MCG/2ML IJ SOLN
100.0000 ug | Freq: Once | INTRAMUSCULAR | Status: AC
  Administered 2016-04-18: 50 ug via INTRAVENOUS

## 2016-04-18 MED ORDER — TRAZODONE HCL 100 MG PO TABS
100.0000 mg | ORAL_TABLET | Freq: Every day | ORAL | Status: DC
  Administered 2016-04-18 – 2016-04-20 (×3): 100 mg via ORAL
  Filled 2016-04-18 (×3): qty 1

## 2016-04-18 MED ORDER — METOCLOPRAMIDE HCL 5 MG/ML IJ SOLN: 5.0000 mg | Freq: Three times a day (TID) | INTRAMUSCULAR | Status: DC | PRN

## 2016-04-18 MED ORDER — MEPERIDINE HCL 25 MG/ML IJ SOLN: 6.2500 mg | INTRAMUSCULAR | Status: DC | PRN

## 2016-04-18 MED ORDER — PROPOFOL 10 MG/ML IV BOLUS
INTRAVENOUS | Status: DC | PRN
  Administered 2016-04-18: 150 mg via INTRAVENOUS

## 2016-04-18 MED ORDER — INSULIN ASPART 100 UNIT/ML ~~LOC~~ SOLN
0.0000 [IU] | Freq: Three times a day (TID) | SUBCUTANEOUS | Status: DC
  Administered 2016-04-19 – 2016-04-20 (×3): 2 [IU] via SUBCUTANEOUS

## 2016-04-18 MED ORDER — SUGAMMADEX SODIUM 200 MG/2ML IV SOLN
INTRAVENOUS | Status: DC | PRN
  Administered 2016-04-18: 200 mg via INTRAVENOUS

## 2016-04-18 MED ORDER — ROCURONIUM BROMIDE 100 MG/10ML IV SOLN
INTRAVENOUS | Status: DC | PRN
  Administered 2016-04-18: 50 mg via INTRAVENOUS

## 2016-04-18 MED ORDER — TRANEXAMIC ACID 1000 MG/10ML IV SOLN
2000.0000 mg | Freq: Once | INTRAVENOUS | Status: AC
  Administered 2016-04-18: 2000 mg via TOPICAL
  Filled 2016-04-18: qty 20

## 2016-04-18 MED ORDER — PHENYLEPHRINE HCL 10 MG/ML IJ SOLN
INTRAVENOUS | Status: DC | PRN
  Administered 2016-04-18: 100 ug/min via INTRAVENOUS

## 2016-04-18 MED ORDER — ONDANSETRON HCL 4 MG/2ML IJ SOLN
INTRAMUSCULAR | Status: DC | PRN
  Administered 2016-04-18: 4 mg via INTRAVENOUS

## 2016-04-18 MED ORDER — ACETAMINOPHEN 650 MG RE SUPP: 650.0000 mg | Freq: Four times a day (QID) | RECTAL | Status: DC | PRN

## 2016-04-18 MED ORDER — LUBIPROSTONE 24 MCG PO CAPS: 24.0000 ug | ORAL_CAPSULE | Freq: Two times a day (BID) | ORAL | Status: DC

## 2016-04-18 MED ORDER — ONDANSETRON HCL 4 MG PO TABS: 4.0000 mg | ORAL_TABLET | Freq: Four times a day (QID) | ORAL | Status: DC | PRN

## 2016-04-18 MED ORDER — CLONAZEPAM 1 MG PO TABS
1.0000 mg | ORAL_TABLET | Freq: Every day | ORAL | Status: DC
  Administered 2016-04-18 – 2016-04-20 (×3): 1 mg via ORAL
  Filled 2016-04-18 (×4): qty 1

## 2016-04-18 SURGICAL SUPPLY — 52 items
BANDAGE ESMARK 6X9 LF (GAUZE/BANDAGES/DRESSINGS) ×1 IMPLANT
BIT DRILL CALIBRATED 4.2 (BIT) IMPLANT
BIT DRILL CALIBRATED 5.0 MM (BIT) IMPLANT
BLADE SAW SGTL HD 18.5X60.5X1. (BLADE) ×2 IMPLANT
BLADE SURG 10 STRL SS (BLADE) IMPLANT
BNDG CMPR 9X6 STRL LF SNTH (GAUZE/BANDAGES/DRESSINGS) ×1
BNDG COHESIVE 4X5 TAN STRL (GAUZE/BANDAGES/DRESSINGS) ×2 IMPLANT
BNDG ESMARK 6X9 LF (GAUZE/BANDAGES/DRESSINGS) ×2
BNDG GAUZE ELAST 4 BULKY (GAUZE/BANDAGES/DRESSINGS) ×2 IMPLANT
CANISTER WOUND CARE 500ML ATS (WOUND CARE) ×1 IMPLANT
CAP END T25 HF ARTHRO NAIL EX (Cap) IMPLANT
COVER MAYO STAND STRL (DRAPES) IMPLANT
COVER SURGICAL LIGHT HANDLE (MISCELLANEOUS) ×3 IMPLANT
DRAPE INCISE IOBAN 66X45 STRL (DRAPES) ×2 IMPLANT
DRAPE OEC MINIVIEW 54X84 (DRAPES) IMPLANT
DRAPE U-SHAPE 47X51 STRL (DRAPES) ×2 IMPLANT
DRILL BIT CALIBRATED 4.2 (BIT) ×2
DRILL BIT CALIBRATED 5.0 MM (BIT) ×2
DRSG ADAPTIC 3X8 NADH LF (GAUZE/BANDAGES/DRESSINGS) ×1 IMPLANT
DRSG VAC ATS MED SENSATRAC (GAUZE/BANDAGES/DRESSINGS) ×1 IMPLANT
DURAPREP 26ML APPLICATOR (WOUND CARE) ×3 IMPLANT
ELECT REM PT RETURN 9FT ADLT (ELECTROSURGICAL) ×2
ELECTRODE REM PT RTRN 9FT ADLT (ELECTROSURGICAL) ×1 IMPLANT
ENDCAP T25 HF ARTHRO NAIL EX (Cap) ×1 IMPLANT
GAUZE SPONGE 4X4 12PLY STRL (GAUZE/BANDAGES/DRESSINGS) ×1 IMPLANT
GLOVE BIOGEL PI IND STRL 9 (GLOVE) ×1 IMPLANT
GLOVE BIOGEL PI INDICATOR 9 (GLOVE) ×1
GLOVE SURG ORTHO 9.0 STRL STRW (GLOVE) ×2 IMPLANT
GOWN STRL REUS W/ TWL LRG LVL3 (GOWN DISPOSABLE) ×1 IMPLANT
GOWN STRL REUS W/ TWL XL LVL3 (GOWN DISPOSABLE) ×1 IMPLANT
GOWN STRL REUS W/TWL LRG LVL3 (GOWN DISPOSABLE) ×2
GOWN STRL REUS W/TWL XL LVL3 (GOWN DISPOSABLE) ×2
GUIDEWIRE 3.2X400 (WIRE) ×1 IMPLANT
KIT BASIN OR (CUSTOM PROCEDURE TRAY) ×2 IMPLANT
KIT ROOM TURNOVER OR (KITS) ×2 IMPLANT
NAIL EX CANN ARTHRODESIS 10MM (Nail) ×1 IMPLANT
NS IRRIG 1000ML POUR BTL (IV SOLUTION) ×2 IMPLANT
PACK ORTHO EXTREMITY (CUSTOM PROCEDURE TRAY) ×2 IMPLANT
PAD ARMBOARD 7.5X6 YLW CONV (MISCELLANEOUS) ×4 IMPLANT
REAMER ROD DEEP FLUTE 2.5X950 (INSTRUMENTS) ×1 IMPLANT
SCREW LOCK STRDRV 6X80 STR (Screw) ×1 IMPLANT
SCREW LOCKING 5.0X34MM (Screw) ×1 IMPLANT
SCREW LOCKING 6.0X68MM (Screw) ×1 IMPLANT
SPONGE LAP 18X18 X RAY DECT (DISPOSABLE) ×3 IMPLANT
SUCTION FRAZIER HANDLE 10FR (MISCELLANEOUS) ×1
SUCTION TUBE FRAZIER 10FR DISP (MISCELLANEOUS) ×1 IMPLANT
SUT ETHILON 2 0 PSLX (SUTURE) ×7 IMPLANT
TOWEL OR 17X24 6PK STRL BLUE (TOWEL DISPOSABLE) ×2 IMPLANT
TOWEL OR 17X26 10 PK STRL BLUE (TOWEL DISPOSABLE) ×2 IMPLANT
TUBE CONNECTING 12X1/4 (SUCTIONS) ×2 IMPLANT
WATER STERILE IRR 1000ML POUR (IV SOLUTION) ×1 IMPLANT
YANKAUER SUCT BULB TIP NO VENT (SUCTIONS) ×1 IMPLANT

## 2016-04-18 NOTE — Anesthesia Procedure Notes (Addendum)
Anesthesia Regional Block:  Popliteal block  Pre-Anesthetic Checklist: ,, timeout performed, Correct Patient, Correct Site, Correct Laterality, Correct Procedure, Correct Position, site marked, Risks and benefits discussed,  Surgical consent,  Pre-op evaluation,  At surgeon's request and post-op pain management  Laterality: Right  Prep: chloraprep       Needles:  Injection technique: Single-shot  Needle Type: Echogenic Stimulator Needle     Needle Length: 9cm 9 cm Needle Gauge: 21 and 21 G    Additional Needles:  Procedures: ultrasound guided (picture in chart) Popliteal block Narrative:  Start time: 04/18/2016 9:30 AM End time: 04/18/2016 9:37 AM Injection made incrementally with aspirations every 5 mL.  Performed by: Personally  Anesthesiologist: Josephine Igo  Additional Notes: Relevant anatomy ID'd using [place]. Incremental 51m injection with frequent aspiration. Patient tolerated procedure well. Adequate sensory level.

## 2016-04-18 NOTE — H&P (Signed)
Travis Alarie Sr. is an 65 y.o. male.   Chief Complaint: Deformity collapse right ankle HPI: Patient is a 65 year old gentleman diabetic insensate neuropathy who is status post open reduction internal fixation for Weber C fibular fracture on the right. Patient has developed Charcot collapse of the ankle with complete failure of the hardware. Patient is also status post subtalar fusion.  Past Medical History:  Diagnosis Date  . ALLERGIC RHINITIS 06/24/2009  . Allergic rhinitis 06/24/2009   Qualifier: Diagnosis of  By: [name], [name]   . Anxiety 11/12/2011   Adequate for discharge   . Arthritis    "all my joints" (09/30/2013)  . Arthritis of foot, right, degenerative 04/15/2014  . Balance disorder 03/12/2013  . Benign neoplasm of cecum   . Benign neoplasm of descending colon   . CAD (coronary artery disease) 06/24/2009   5 stents placed in 2007    . Chronic anticoagulation   . Chronic pain syndrome 10/27/2009   of ankle, shoulders, low back.  sciatica.   . Closed fracture of right foot 10/17/2014  . CORONARY ARTERY DISEASE 06/24/2009   a. s/p multiple PCIs - In 2008 he had a Taxus DES to the mild LAD, Endeavor DES to mid LCX and distal LCX. In January 2009 he had DES to distal LCX, mid LCX and proximal LCX. In November 2009 had BMS x 2 to the mid RCA. Cath 10/2011 with patent stents, noncardiac CP. LHC 01/2013: patent stents (noncardiac CP).  . DEGENERATIVE JOINT DISEASE 06/24/2009   Qualifier: Diagnosis of  By: [name], [name]   . Depression   . Depression with anxiety    Prior suicide attempt  . [place] DISEASE, LUMBAR 04/19/2010  . ERECTILE DYSFUNCTION, ORGANIC 05/30/2010  . Essential hypertension 06/24/2009   Qualifier: Diagnosis of  By: [name], [name]   . Fibromyalgia   . Fracture dislocation of ankle joint 09/02/2015  . Gait disorder 03/12/2013  . General weakness 07/14/2014  . GERD (gastroesophageal reflux disease) 09/08/2015  . Hand joint pain 06/10/2013  . Heart murmur   . Hepatitis    hepatitis c  . History of kidney stones   . Hyperlipidemia 07/15/2009   Qualifier: Diagnosis of  By: Aundra Dubin, MD, [place]    . HYPERLIPIDEMIA-MIXED 07/15/2009  . HYPERTENSION 06/24/2009  . Insomnia 10/04/2011  . Irregular heart beat   . Left hip pain 03/12/2013   Injected under ultrasound guidance on June 24, 2013   . Major depression 09/13/2015  . Myocardial infarction 2008  . NEPHROLITHIASIS, HX OF 06/24/2009  . Non-cardiac chest pain 10/2011, 01/2013  . Obesity   . Occult blood positive stool 10/17/2014  . Open ankle fracture 09/02/2015  . OSA (obstructive sleep apnea)    not using CPAP (09/30/2013)  . Pain of right thumb 04/03/2013  . Pneumonia   . PPD positive 04/08/2015  . Pre-ulcerative corn or callous 02/06/2013  . Restless legs   . Rotator cuff tear arthropathy of both shoulders 06/10/2013   History of bilateral shoulder cuff surgery for rotator cuff tears. Reports increase in pain 09/11/2015 during physical therapy of the left shoulder.   . S/P laparoscopic hernia repair 05/11/2015  . S/P TKR (total knee replacement) bilaterally 09/13/2015   2007   . SCIATICA, LEFT 04/19/2010   Qualifier: Diagnosis of  By: [name], [name]   . Spinal stenosis in cervical region 09/26/2013  . Spinal stenosis, lumbar region, with neurogenic claudication 09/26/2013  . Type II diabetes mellitus ([place])  2012   no meds in 09/2014.   [name] Uncontrolled type 2 DM with peripheral circulatory disorder ([place]) 10/04/2013  . URETHRAL STRICTURE 06/24/2009   self catheterizes.     Past Surgical History:  Procedure Laterality Date  . ANKLE FUSION Right 04/15/2014   Procedure: Right Subtalar, Talonavicular Fusion;  Surgeon: Newt Minion, MD;  Location: [place];  Service: Orthopedics;  Laterality: Right;  . ANTERIOR CERVICAL DECOMP/DISCECTOMY FUSION N/A 09/26/2013   Procedure: ANTERIOR CERVICAL DISCECTOMY FUSION C3-4, plate and screw fixation, allograft bone graft;  Surgeon: Jessy Oto, MD;  Location: [place];  Service:  Orthopedics;  Laterality: N/A;  . CARDIAC CATHETERIZATION  X 1  . CARPAL TUNNEL RELEASE Bilateral   . COLONOSCOPY    . COLONOSCOPY N/A 10/22/2014   Procedure: COLONOSCOPY;  Surgeon: Lafayette Dragon, MD;  Location[facility] ENDOSCOPY;  Service: Endoscopy;  Laterality: N/A;  . CORONARY ANGIOPLASTY WITH STENT PLACEMENT     "I have 9 stents"  . ESOPHAGOGASTRODUODENOSCOPY N/A 10/19/2014   Procedure: ESOPHAGOGASTRODUODENOSCOPY (EGD);  Surgeon: Jerene Bears, MD;  Location: [facility] ENDOSCOPY;  Service: Endoscopy;  Laterality: N/A;  . FUSION OF TALONAVICULAR JOINT Right 04/15/2014   dr Verne C[place]  . HERNIA REPAIR     umbilical  . INGUINAL HERNIA REPAIR  05/11/2015  . INGUINAL HERNIA REPAIR Right 05/11/2015   Procedure: LAPAROSCOPIC REPAIR RIGHT  INGUINAL HERNIA;  Surgeon: Greer Pickerel, MD;  Location: [place];  Service: General;  Laterality: Right;  . INSERTION OF MESH Right 05/11/2015   Procedure: INSERTION OF MESH;  Surgeon: Greer Pickerel, MD;  Location: [place];  Service: General;  Laterality: Right;  . JOINT REPLACEMENT Bilateral    knees  . KNEE ARTHROSCOPY Right X 7  . KNEE CARTILAGE SURGERY Right X 4   "open; not scopes"  . KNEE CARTILAGE SURGERY Left X 3   "open; not scopes"  . LAMINECTOMY  01/28/2014   L3 L4 L5  . LEFT HEART CATHETERIZATION WITH CORONARY ANGIOGRAM N/A 02/10/2013   Procedure: LEFT HEART CATHETERIZATION WITH CORONARY ANGIOGRAM;  Surgeon: Burnell Blanks, MD;  Location: [facility] CATH LAB;  Service: Cardiovascular;  Laterality: N/A;  . LUMBAR LAMINECTOMY/DECOMPRESSION MICRODISCECTOMY N/A 01/27/2014   Procedure: CENTRAL LUMBAR LAMINECTOMY L4-5 AND L3-4;  Surgeon: Jessy Oto, MD;  Location: [place];  Service: Orthopedics;  Laterality: N/A;  . ORIF ANKLE FRACTURE Right 09/02/2015  . ORIF ANKLE FRACTURE Right 09/02/2015   Procedure: OPEN REDUCTION INTERNAL FIXATION (ORIF) ANKLE FRACTURE;  Surgeon: Newt Minion, MD;  Location: [place];  Service: Orthopedics;  Laterality: Right;  . PERIPHERALLY INSERTED  CENTRAL CATHETER INSERTION  09/02/2015  . SHOULDER ARTHROSCOPY W/ ROTATOR CUFF REPAIR Bilateral    "3on the left; 2 on the right"  . SHOULDER OPEN ROTATOR CUFF REPAIR Right X 2  . SKIN SPLIT GRAFT Right 10/01/2015   Procedure: RIGHT ANKLE APPLY SKIN GRAFT SPLIT THICKNESS;  Surgeon: Newt Minion, MD;  Location: [place];  Service: Orthopedics;  Laterality: Right;  . TONSILLECTOMY    . TOTAL KNEE ARTHROPLASTY Bilateral 2008  . UMBILICAL HERNIA REPAIR     UHR  . URETHRAL DILATION  X 4  . VASECTOMY      Family History  Problem Relation Age of Onset  . Depression Mother   . Heart disease Mother   . Hypertension Mother   . Cancer Mother     Breast  . Diabetes Father   . Heart disease Father     CABG  .  Cancer Father     prostate and skin cancer  . Hypertension Father   . Hyperlipidemia Father   . Depression Brother     x 2  . Hypertension Brother     x2  . Heart disease Maternal Grandfather   . Early death Maternal Grandfather 80    heart attack  . Early death Paternal Grandfather   . Coronary artery disease Other   . Hypertension Other   . Depression Other    Social History:  reports that he quit smoking about 5 years ago. His smoking use included Cigars. He has never used smokeless tobacco. He reports that he does not drink alcohol or use drugs.  Allergies: No Known Allergies  Medications Prior to Admission  Medication Sig Dispense Refill  . AMITIZA 24 MCG capsule Take 24 mcg by mouth 2 (two) times daily with a meal.     . ARIPiprazole (ABILIFY) 5 MG tablet Take 1 tablet (5 mg total) by mouth daily. Take one tablet at bedtime 90 tablet 0  . aspirin 81 MG tablet Take 81 mg by mouth daily.    [name] atorvastatin (LIPITOR) 20 MG tablet TAKE 1 TABLET BY MOUTH EVERY DAY 90 tablet 3  . clonazePAM (KLONOPIN) 1 MG tablet TAKE 1 TABLET BY MOUTH AT BEDTIME AS NEEDED FOR ANXIETY 30 tablet 5  . cyclobenzaprine (FLEXERIL) 10 MG tablet Take 10 mg by mouth 3 (three) times daily as needed for  muscle spasms.    [name] FLUoxetine (PROZAC) 40 MG capsule TAKE 1 CAPSULE(40 MG) BY MOUTH DAILY 30 capsule 0  . LYRICA 100 MG capsule Take 1 capsule (100 mg total) by mouth 3 (three) times daily. 90 capsule 2  . metFORMIN (GLUCOPHAGE) 500 MG tablet Take 1 tablet (500 mg total) by mouth 2 (two) times daily with a meal. 180 tablet 3  . Omega-3 Fatty Acids (FISH OIL) 1000 MG CAPS Take 1,000 mg by mouth daily.     [name] oxycodone (ROXICODONE) 30 MG immediate release tablet Take 1 tablet (30 mg total) by mouth 5 (five) times daily. 150 tablet 0  . prasugrel (EFFIENT) 5 MG TABS tablet Take 1 tablet (5 mg total) by mouth daily. 30 tablet 3  . tamsulosin (FLOMAX) 0.4 MG CAPS capsule Take 0.4 mg by mouth daily after breakfast.     . traZODone (DESYREL) 100 MG tablet Take 1 tablet (100 mg total) by mouth at bedtime. 90 tablet 0  . triamcinolone (NASACORT AQ) 55 MCG/ACT AERO nasal inhaler Place 2 sprays into the nose daily. 1 Inhaler 12    No results found for this or any previous visit (from the past 48 hour(s)). No results found.  Review of Systems  All other systems reviewed and are negative.   Height 6' 1"  (1.854 m), weight 279 lb (126.6 kg). Physical Exam  Examination patient is alert oriented no adenopathy well-dressed normal affect normal respiratory effort he does have an antalgic gait he has a palpable dorsalis pedis pulse. Examination the right foot he has approximate 45 of valgus and a partially 70 of external rotation of the right ankle. The medial malleolus is prominent medially and patient has now developed skin breakdown of about 1 cm in diameter 0.1 mm deep with healthy granulation tissue this does not extend down to bone. Laterally there is no open wounds patient has no redness no cellulitis no signs of infection. Radiographs show a Charcot collapse with complete failure of the hardware. Assessment/Plan Assessment: Diabetic insensate neuropathy with Charcot  collapse of the internal fixation  right ankle with severe deformity with ulceration medially. Plan we will plan for tibial calcaneal fusion. Risk and benefits were discussed including infection neurovascular injury pain deformity and need for additional surgery. Patient states she understands wish to proceed at this time.  Newt Minion, MD 04/18/2016, 8:57 AM

## 2016-04-18 NOTE — Anesthesia Preprocedure Evaluation (Addendum)
Anesthesia Evaluation  Patient identified by MRN, date of birth, ID band Patient awake    Reviewed: Allergy & Precautions, NPO status , Patient's Chart, lab work & pertinent test results  History of Anesthesia Complications (+) Family history of anesthesia reaction  Airway Mallampati: II  TM Distance: >3 FB Neck ROM: Full    Dental  (+) Poor Dentition, Caps, Missing, Dental Advisory Given   Pulmonary sleep apnea and Continuous Positive Airway Pressure Ventilation , pneumonia, resolved, former smoker,  + PPD   Pulmonary exam normal breath sounds clear to auscultation       Cardiovascular Exercise Tolerance: Poor hypertension, Pt. on medications + CAD, + Past MI, + Cardiac Stents and + Peripheral Vascular Disease  Normal cardiovascular exam+ Valvular Problems/Murmurs  Rhythm:Regular Rate:Normal  MI 2008 Stent placement x 5 -2007   Neuro/Psych  Headaches, PSYCHIATRIC DISORDERS Anxiety Depression Chronic pain syndrome  Neuromuscular disease    GI/Hepatic GERD  Medicated and Controlled,(+) Hepatitis -  Endo/Other  diabetes, Poorly Controlled, Type 2, Oral Hypoglycemic AgentsHyperlipidemia Obesity  Renal/GU Renal InsufficiencyRenal diseaseHx/o renal calculi   BPH    Musculoskeletal  (+) Arthritis , Osteoarthritis,  Fibromyalgia -, narcotic dependentSpinal stenosis with neurogenic claudication Left sciatica Charcot deformity right ankle   Abdominal (+) + obese,   Peds  Hematology  (+) anemia , Effient- last dose   Anesthesia Other Findings   Reproductive/Obstetrics                           Lab Results  Component Value Date   WBC 4.6 11/10/2015   HGB 12.5 (L) 11/10/2015   HCT 37.6 (L) 11/10/2015   MCV 86.6 11/10/2015   PLT 267.0 11/10/2015     Chemistry      Component Value Date/Time   NA 140 11/10/2015 1305   K 4.0 11/10/2015 1305   CL 106 11/10/2015 1305   CO2 26 11/10/2015 1305    BUN 17 11/10/2015 1305   CREATININE 0.81 11/10/2015 1305      Component Value Date/Time   CALCIUM 9.1 11/10/2015 1305   ALKPHOS 96 11/10/2015 1305   AST 12 11/10/2015 1305   ALT 10 11/10/2015 1305   BILITOT 0.4 11/10/2015 1305     Lab Results  Component Value Date   INR 1.20 10/20/2014   INR 1.18 04/15/2014   INR 1.15 05/03/2013   Echo 10/19/14: Left ventricle: The cavity size was normal. There was mild   concentric hypertrophy. Systolic function was vigorous. The   estimated ejection fraction was in the range of 65% to 70%. Wall   motion was normal; there were no regional wall motion   abnormalities. Left ventricular diastolic function parameters   were normal. - Aortic valve: Trileaflet; normal thickness leaflets. There was no   regurgitation. - Aortic root: The aortic root was normal in size. - Ascending aorta: The ascending aorta was normal in size. - Mitral valve: Structurally normal valve. There was no   regurgitation. - Left atrium: The atrium was normal in size. - Right ventricle: Systolic function was normal. - Right atrium: The atrium was normal in size. - Tricuspid valve: Structurally normal valve. There was   mild-moderate regurgitation. - Pulmonic valve: There was trivial regurgitation. - Pulmonary arteries: Systolic pressure was within the normal   range. - Inferior vena cava: The vessel was normal in size. - Pericardium, extracardiac: There was no pericardial effusion.  Impressions:  - Mild to moderate tricuspid regurgitation, otherwise  normal study.  EKG: normal EKG, normal sinus rhythm.  Anesthesia Physical Anesthesia Plan  ASA: III  Anesthesia Plan: General and Regional   Post-op Pain Management:  Regional for Post-op pain   Induction:   Airway Management Planned: LMA  Additional Equipment:   Intra-op Plan:   Post-operative Plan: Extubation in OR  Informed Consent: I have reviewed the patients History and Physical, chart, labs  and discussed the procedure including the risks, benefits and alternatives for the proposed anesthesia with the patient or authorized representative who has indicated his/her understanding and acceptance.   Dental advisory given  Plan Discussed with: Anesthesiologist, CRNA and Surgeon  Anesthesia Plan Comments:        Anesthesia Quick Evaluation

## 2016-04-18 NOTE — NC FL2 (Signed)
[place] LEVEL OF CARE SCREENING TOOL     IDENTIFICATION  Patient Name: Travis Luchsinger Sr. Birthdate: 19-Apr-1951 Sex: male Admission Date (Current Location): 04/18/2016  [facility] and [place] Number:  Herbalist and Address:  [facility], [place] [address] [place], [place] [address]      Provider Number: [id number]  Attending Physician Name and Address:  Newt Minion, MD  Relative Name and Phone Number:       Current Level of Care: Hospital Recommended Level of Care: [place] Prior Approval Number:    Date Approved/Denied:   PASRR Number: [id number]  Discharge Plan: SNF    Current Diagnoses: Patient Active Problem List   Diagnosis Date Noted  . S/P ankle fusion 04/18/2016  . Charcot's arthropathy associated with type 2 diabetes mellitus ([place]) 04/11/2016  . Mechanical breakdown of internal fixation device of bone of right lower leg ([place]) 04/11/2016  . Encounter for well adult exam with abnormal findings 11/05/2015  . Major depression 09/13/2015  . S/P TKR (total knee replacement) bilaterally 09/13/2015  . GERD (gastroesophageal reflux disease) 09/08/2015  . Open ankle fracture 09/02/2015  . Fracture dislocation of ankle joint 09/02/2015  . S/P laparoscopic hernia repair 05/11/2015  . PPD positive 04/08/2015  . Benign neoplasm of descending colon   . Benign neoplasm of cecum   . AKI (acute kidney injury) ([place]) 10/18/2014  . Acute blood loss anemia   . Chronic anticoagulation   . Closed fracture of right foot 10/17/2014  . Occult blood positive stool 10/17/2014  . General weakness 07/14/2014  . Urinary incontinence 07/14/2014  . Arthritis of foot, right, degenerative 04/15/2014  . Headache(784.0) 10/15/2013  . Charcot's arthropathy, diabetic ([place]) 10/04/2013  . Spinal stenosis in cervical region 09/26/2013    Class: Chronic  . Spinal stenosis, lumbar region, with neurogenic claudication 09/26/2013     Class: Chronic  . Hand joint pain 06/10/2013  . Rotator cuff tear arthropathy of both shoulders 06/10/2013  . Skin lesion of cheek 05/01/2013  . Pain of right thumb 04/03/2013  . Balance disorder 03/12/2013  . Gait disorder 03/12/2013  . Tremor 03/12/2013  . Left hip pain 03/12/2013  . Pre-ulcerative corn or callous 02/06/2013  . Anxiety 11/12/2011  . OSA (obstructive sleep apnea) 11/07/2011  . Bradycardia 10/20/2011  . Insomnia 10/04/2011  . Obesity 01/12/2011  . ERECTILE DYSFUNCTION, ORGANIC 05/30/2010  . [place] DISEASE, LUMBAR 04/19/2010  . SCIATICA, LEFT 04/19/2010  . Chronic pain syndrome 10/27/2009  . Hyperlipidemia 07/15/2009  . Essential hypertension 06/24/2009  . CAD (coronary artery disease) 06/24/2009  . Allergic rhinitis 06/24/2009  . URETHRAL STRICTURE 06/24/2009  . DEGENERATIVE JOINT DISEASE 06/24/2009  . SHOULDER PAIN, BILATERAL 06/24/2009  . FATIGUE 06/24/2009  . NEPHROLITHIASIS, HX OF 06/24/2009    Orientation RESPIRATION BLADDER Height & Weight     Self, Time, Situation, Place  Normal Continent Weight: 132.1 kg (291 lb 3.6 oz) Height:  6' 1"  (185.4 cm)  BEHAVIORAL SYMPTOMS/MOOD NEUROLOGICAL BOWEL NUTRITION STATUS      Continent Diet (Please see DC Summary)  AMBULATORY STATUS COMMUNICATION OF NEEDS Skin   Limited Assist Verbally Surgical wounds (Closed incision on ankle and heel; negative pressure wound on ankle and heel)                       Personal Care Assistance Level of Assistance  Bathing, Feeding, Dressing Bathing Assistance: Maximum assistance Feeding assistance: Independent Dressing Assistance: Limited  assistance     Functional Limitations Info             SPECIAL CARE FACTORS FREQUENCY  PT (By licensed PT)     PT Frequency: not yet assessed              Contractures      Additional Factors Info  Code Status, Allergies Code Status Info: Full Allergies Info: NKA           Current Medications (04/18/2016):   This is the current hospital active medication list Current Facility-Administered Medications  Medication Dose Route Frequency Provider Last Rate Last Dose  . 0.9 %  sodium chloride infusion   Intravenous Continuous Newt Minion, MD      . acetaminophen (TYLENOL) tablet 650 mg  650 mg Oral Q6H PRN Newt Minion, MD       Or  . acetaminophen (TYLENOL) suppository 650 mg  650 mg Rectal Q6H PRN Newt Minion, MD      . ARIPiprazole (ABILIFY) tablet 5 mg  5 mg Oral QHS Newt Minion, MD      . aspirin EC tablet 81 mg  81 mg Oral Daily Newt Minion, MD      . atorvastatin (LIPITOR) tablet 20 mg  20 mg Oral Daily Newt Minion, MD      . bisacodyl (DULCOLAX) EC tablet 5 mg  5 mg Oral Daily PRN Newt Minion, MD      . ceFAZolin (ANCEF) IVPB 2g/100 mL premix  2 g Intravenous Q6H Newt Minion, MD      . clonazePAM Bobbye [place]) tablet 1 mg  1 mg Oral Daily Newt Minion, MD      . docusate sodium (COLACE) capsule 100 mg  100 mg Oral BID Newt Minion, MD      . Derrill Memo ON 04/19/2016] FLUoxetine (PROZAC) capsule 40 mg  40 mg Oral Daily Newt Minion, MD      . HYDROmorphone (DILAUDID) injection 1 mg  1 mg Intravenous Q2H PRN Newt Minion, MD      . insulin aspart (novoLOG) injection 0-15 Units  0-15 Units Subcutaneous TID WC Newt Minion, MD      . metFORMIN (GLUCOPHAGE) tablet 500 mg  500 mg Oral BID WC Newt Minion, MD      . methocarbamol (ROBAXIN) tablet 500 mg  500 mg Oral Q6H PRN Newt Minion, MD       Or  . methocarbamol (ROBAXIN) 500 mg in dextrose 5 % 50 mL IVPB  500 mg Intravenous Q6H PRN Newt Minion, MD      . metoCLOPramide (REGLAN) tablet 5-10 mg  5-10 mg Oral Q8H PRN Newt Minion, MD       Or  . metoCLOPramide (REGLAN) injection 5-10 mg  5-10 mg Intravenous Q8H PRN Newt Minion, MD      . midazolam (VERSED) 2 MG/2ML injection           . ondansetron (ZOFRAN) tablet 4 mg  4 mg Oral Q6H PRN Newt Minion, MD       Or  . ondansetron [facility]) injection 4 mg  4 mg Intravenous Q6H  PRN Newt Minion, MD      . oxyCODONE (Oxy IR/ROXICODONE) 5 MG immediate release tablet           . oxyCODONE (Oxy IR/ROXICODONE) immediate release tablet 5-10 mg  5-10 mg Oral Q3H PRN Newt Minion, MD  10 mg at 04/18/16 1509  . polyethylene glycol (MIRALAX / GLYCOLAX) packet 17 g  17 g Oral Daily PRN Newt Minion, MD      . pregabalin (LYRICA) capsule 100 mg  100 mg Oral TID Newt Minion, MD      . Derrill Memo ON 04/19/2016] tamsulosin (FLOMAX) capsule 0.4 mg  0.4 mg Oral QPC breakfast Newt Minion, MD      . traZODone (DESYREL) tablet 100 mg  100 mg Oral QHS Newt Minion, MD         Discharge Medications: Please see discharge summary for a list of discharge medications.  Relevant Imaging Results:  Relevant Lab Results:   Additional Information SS#: [id number]  Benard Halsted, LCSWA

## 2016-04-18 NOTE — Anesthesia Procedure Notes (Addendum)
Procedure Name: Intubation Date/Time: 04/18/2016 11:41 AM Performed by: Suzy Bouchard Pre-anesthesia Checklist: Patient identified, Emergency Drugs available, Suction available and Patient being monitored Patient Re-evaluated:Patient Re-evaluated prior to inductionOxygen Delivery Method: Circle system utilized Preoxygenation: Pre-oxygenation with 100% oxygen Intubation Type: IV induction Ventilation: Mask ventilation without difficulty and Oral airway inserted - appropriate to patient size Laryngoscope Size: Sabra Heck and 2 Grade View: Grade I Tube type: Oral Tube size: 7.5 mm Number of attempts: 1 Airway Equipment and Method: Stylet Placement Confirmation: ETT inserted through vocal cords under direct vision,  positive ETCO2 and breath sounds checked- equal and bilateral Secured at: 22 cm Tube secured with: Tape Dental Injury: Teeth and Oropharynx as per pre-operative assessment

## 2016-04-18 NOTE — Progress Notes (Signed)
Orthopedic Tech Progress Note Patient Details:  Travis Fendley Sr. 03-13-1951 [id number]  Ortho Devices Type of Ortho Device: CAM walker Ortho Device/Splint Location: RLE Ortho Device/Splint Interventions: [name] 04/18/2016, 7:16 PM

## 2016-04-18 NOTE — Progress Notes (Signed)
Report given to Rica Koyanagi as caregiver

## 2016-04-18 NOTE — Anesthesia Postprocedure Evaluation (Signed)
Anesthesia Post Note  Patient: Travis Brislin Sr.  Procedure(s) Performed: Procedure(s) (LRB): Right Ankle Tibiocalcaneal Fusion (Right)  Patient location during evaluation: PACU Anesthesia Type: General Level of consciousness: awake and alert and patient cooperative Pain management: pain level controlled Vital Signs Assessment: post-procedure vital signs reviewed and stable Respiratory status: spontaneous breathing and respiratory function stable Cardiovascular status: stable Anesthetic complications: no    Last Vitals:  Vitals:   04/18/16 1447 04/18/16 1502  BP: (!) 111/59 (!) 122/58  Pulse: 70 69  Resp: 20 19  Temp:      Last Pain:  Vitals:   04/18/16 1500  TempSrc:   PainSc: [place]

## 2016-04-18 NOTE — Op Note (Signed)
04/18/2016  1:25 PM  PATIENT:  Travis Dowson Sr.    PRE-OPERATIVE DIAGNOSIS:  Charcot Collapse Right Ankle  POST-OPERATIVE DIAGNOSIS:  Same  PROCEDURE:  Right Ankle Tibiocalcaneal Fusion Removal of deep retained hardware. Application of wound VAC Local tissue rearrangement for wound closure 5 x 10 cm  SURGEON:  Newt Minion, MD  PHYSICIAN ASSISTANT:None ANESTHESIA:   General  PREOPERATIVE INDICATIONS:  Travis Osmun Sr. is a  65 y.o. male with a diagnosis of Charcot Collapse Right Ankle who failed conservative measures and elected for surgical management.    The risks benefits and alternatives were discussed with the patient preoperatively including but not limited to the risks of infection, bleeding, nerve injury, cardiopulmonary complications, the need for revision surgery, among others, and the patient was willing to proceed.  OPERATIVE IMPLANTS: Synthes 10 x 170 mm tibial calcaneal nail locked distally 2 proximally 1  OPERATIVE FINDINGS: Severe Charcot collapse  OPERATIVE PROCEDURE: Patient brought the operating room and underwent a general anesthetic. After adequate levels anesthesia obtained patient's placed the left lateral decubitus position with the right side up and the right lower extremity was prepped using DuraPrep draped into a sterile field. A timeout was called. Patient's previous lateral incision was used and this was carried sharply down to the fibula. The distal aspect of the fibula was resected through the fracture site. The most distal aspect of the hardware was removed. The calcaneal screw was removed. The distal tibia and talar dome were resected perpendicular to long axis of the tibia. Incision was made plantarly and a guidewire was inserted from the calcaneus through the talus into the tibia C-arm floss be verified alignment in AP and lateral planes. This was then overreamed with the reamer. The nail was inserted and advanced the guide was used to  insert 2 posterior screws from the calcaneus through the nail. The fusion site was compressed and a proximal locking screw was placed 34 mm in length. The end cap was placed. The wound was soaked with 2 g of TXA-soaked sponges. There was no active bleeding. After correction of the alignment local tissue rearrangement was used to close a wound 5 x 10 cm. The incisions were closed using 2-0 nylon. C-arm fluoroscopy was used to verify alignment. A incisional wound VAC was applied laterally and this was sealed with Ioban and this had a good suction fit patient was extubated taken to the PACU in stable condition.

## 2016-04-18 NOTE — Transfer of Care (Signed)
Immediate Anesthesia Transfer of Care Note  Patient: Travis Cassarino Sr.  Procedure(s) Performed: Procedure(s): Right Ankle Tibiocalcaneal Fusion (Right)  Patient Location: PACU  Anesthesia Type:General  Level of Consciousness: awake and alert   Airway & Oxygen Therapy: Patient Spontanous Breathing and Patient connected to nasal cannula oxygen  Post-op Assessment: Report given to RN, Post -op Vital signs reviewed and stable and Patient moving all extremities X 4  Post vital signs: Reviewed and stable  Last Vitals:  Vitals:   04/18/16 0911 04/18/16 1331  BP: 106/67 (P) 95/66  Pulse: (!) 51   Resp: 18   Temp: 36.9 C 36.4 C    Last Pain:  Vitals:   04/18/16 0911  TempSrc: Oral      Patients Stated Pain Goal: 4 (54/65/68 1275)  Complications: No apparent anesthesia complications

## 2016-04-19 ENCOUNTER — Encounter (HOSPITAL_COMMUNITY): Payer: Self-pay | Admitting: Orthopedic Surgery

## 2016-04-19 LAB — GLUCOSE, CAPILLARY
GLUCOSE-CAPILLARY: 128 mg/dL — AB (ref 65–99)
GLUCOSE-CAPILLARY: 114 mg/dL — AB (ref 65–99)
Glucose-Capillary: 96 mg/dL (ref 65–99)
Glucose-Capillary: 118 mg/dL — ABNORMAL HIGH (ref 65–99)

## 2016-04-19 LAB — HEMOGLOBIN A1C
HEMOGLOBIN A1C: 5.3 % (ref 4.8–5.6)
MEAN PLASMA GLUCOSE: 105 mg/dL

## 2016-04-19 MED ORDER — DIPHENHYDRAMINE HCL 25 MG PO CAPS
25.0000 mg | ORAL_CAPSULE | Freq: Four times a day (QID) | ORAL | Status: DC | PRN
  Administered 2016-04-19: 25 mg via ORAL
  Filled 2016-04-19: qty 1

## 2016-04-19 NOTE — Clinical Social Work Note (Signed)
Clinical Social Work Assessment  Patient Details  Name: Travis [name]. MRN: [id number] Date of Birth: [birth year]  Date of referral:  04/19/16               Reason for consult:  Facility Placement, Discharge Planning                Permission sought to share information with:  Facility Sport and exercise psychologist, Family Supports Permission granted to share information::  Yes, Verbal Permission Granted  Name::     Zhi Geier  Agency::  SNF's  Relationship::  Brother  Contact Information:  [phone]  Housing/Transportation Living arrangements for the past 2 months:  Mobile Home Source of Information:  Patient, Medical Team Patient Interpreter Needed:  None Criminal Activity/Legal Involvement Pertinent to Current Situation/Hospitalization:  No - Comment as needed Significant Relationships:  Siblings, Other Family Members Lives with:  Other (Comment) (Family) Do you feel safe going back to the place where you live?  Yes Need for family participation in patient care:  Yes (Comment)  Care giving concerns:  PT recommending SNF once medically stable for discharge.   Social Worker assessment / plan:  CSW met with patient. No supports at bedside. CSW introduced role and explained that discharge planning would be discussed. Patient agreeable to SNF placement. Preferences are [place] and [facility]. No further concerns. CSW encouraged patient to contact CSW as needed. CSW will continue to follow patient for support and facilitate discharge to SNF once medically stable.  Employment status:  Unemployed Nurse, adult PT Recommendations:  [place] / Referral to community resources:  [place]  Patient/Family's Response to care:  Patient agreeable to SNF placement. Patient's family supportive and involved in patient's care. Patient appreciated social work intervention.  Patient/Family's Understanding of and  Emotional Response to Diagnosis, Current Treatment, and Prognosis:  Patient understands and is agreeable to discharge plan. Patient appears happy with hospital care.  Emotional Assessment Appearance:  Appears stated age Attitude/Demeanor/Rapport:  Other (Pleasant) Affect (typically observed):  Accepting, Appropriate, Calm, Pleasant Orientation:  Oriented to Self, Oriented to Place, Oriented to  Time, Oriented to Situation Alcohol / Substance use:  Never Used Psych involvement (Current and /or in the community):  No (Comment)  Discharge Needs  Concerns to be addressed:  Care Coordination Readmission within the last 30 days:  No Current discharge risk:  Dependent with Mobility Barriers to Discharge:  Continued Medical Work up, [place], LCSW 04/19/2016, 3:24 PM

## 2016-04-19 NOTE — Evaluation (Signed)
Physical Therapy Evaluation Patient Details Name: Travis [name]. MRN: [id number] DOB: 1951-01-18 Today's Date: 04/19/2016   History of Present Illness  Travis [name]. is a  65 y.o. male with a diagnosis of Charcot Collapse Right Ankle.  S/p tibiocalcaneal fusion.    Clinical Impression  Pt admitted with above diagnosis. Pt currently with functional limitations due to the deficits listed below (see PT Problem List). Pt was able to stand and pivot to recliner without assist by PT.  Will need SNF.  Pt will benefit from skilled PT to increase their independence and safety with mobility to allow discharge to the venue listed below.      Follow Up Recommendations SNF;Supervision/Assistance - 24 hour    Equipment Recommendations  Other (comment) (TBA)    Recommendations for Other Services       Precautions / Restrictions Precautions Precautions: Fall Required Braces or Orthoses: Other Brace/Splint (CAM walker) Restrictions Weight Bearing Restrictions: Yes RLE Weight Bearing: Non weight bearing      Mobility  Bed Mobility Overal bed mobility: Needs Assistance Bed Mobility: Supine to Sit     Supine to sit: Min assist     General bed mobility comments: Pt needed a little assist for elevation of trunk to scoot out to EOB.  Was able to manage his right LE on his own.   Transfers Overall transfer level: Needs assistance Equipment used: Rolling walker (2 wheeled) Transfers: Sit to/from [facility] Sit to Stand: Mod assist;+2 safety/equipment;From elevated surface (bed was raised significantly) Stand pivot transfers: Mod assist;+2 safety/equipment       General transfer comment: Pt was able to stand to RW and was able to maintain NWB right LE with close supervision of weight bearing with 2nd person ready to hold right LE prn.  Pt able to push on Ues to pivot to recliner with RW.    Ambulation/Gait                Stairs             Wheelchair Mobility    Modified Rankin (Stroke Patients Only)       Balance Overall balance assessment: Needs assistance Sitting-balance support: No upper extremity supported;Feet supported Sitting balance-Leahy Scale: Good     Standing balance support: Bilateral upper extremity supported;During functional activity Standing balance-Leahy Scale: Poor Standing balance comment: Heavy reliance on UEs for stand pivot transfer.                             Pertinent Vitals/Pain Pain Assessment: 0-10 Pain Score: 9  Pain Location: right ankle Pain Descriptors / Indicators: Sharp;Burning Pain Intervention(s): Limited activity within patient's tolerance;Monitored during session;Repositioned;Patient requesting pain meds-RN notified;RN gave pain meds during session  VSS    Home Living Family/patient expects to be discharged to:: Private residence Living Arrangements: Other relatives Available Help at Discharge: Family;Available PRN/intermittently Type of Home: Mobile home Home Access: Stairs to enter Entrance Stairs-Rails: Can reach both Entrance Stairs-Number of Steps: 4 Home Layout: One level Home Equipment: Walker - 2 wheels;Cane - single point;Crutches;Bedside commode;Shower seat Additional Comments: plans to live with niece when d/c's, she works 6:30am -5:45 pm.  Pt will be alone at that time. Sometimes great niece is there.      Prior Function Level of Independence: Needs assistance   Gait / Transfers Assistance Needed: ambulates with a cane wtih community ambulation  ADL's / Homemaking Assistance Needed: needs help with putting  on shoes or doing anything low. used to get help from neice's daughter, but now takes 15 minutes to put on shoes per pt  Does own grooming and showers with seat        Hand Dominance   Dominant Hand: Right    Extremity/Trunk Assessment   Upper Extremity Assessment: Defer to OT evaluation           Lower Extremity  Assessment: RLE deficits/detail RLE Deficits / Details: knee 2+/5, hip 2+/5, ankle fused therefore not tested    Cervical / Trunk Assessment: Normal  Communication   Communication: No difficulties  Cognition Arousal/Alertness: Awake/alert Behavior During Therapy: WFL for tasks assessed/performed Overall Cognitive Status: Within Functional Limits for tasks assessed                      General Comments General comments (skin integrity, edema, etc.): Pt with 2 wound VACS on right LE with edema noted.     Exercises General Exercises - Lower Extremity Quad Sets: AROM;Both;10 reps;Seated Gluteal Sets: AROM;Both;10 reps;Seated Heel Slides: AROM;Both;5 reps;Seated Hip ABduction/ADduction: AROM;Both;5 reps;Seated Straight Leg Raises: AROM;Both;5 reps;Seated   Assessment/Plan    PT Assessment Patient needs continued PT services  PT Problem List Decreased activity tolerance;Decreased balance;Decreased strength;Decreased range of motion;Decreased mobility;Decreased knowledge of use of DME;Decreased safety awareness;Decreased knowledge of precautions;Pain;Impaired sensation;Decreased skin integrity          PT Treatment Interventions DME instruction;Gait training;Functional mobility training;Therapeutic activities;Therapeutic exercise;Balance training;Patient/family education    PT Goals (Current goals can be found in the Care Plan section)  Acute Rehab PT Goals Patient Stated Goal: to get better PT Goal Formulation: With patient Time For Goal Achievement: 05/03/16 Potential to Achieve Goals: Good    Frequency Min 3X/week   Barriers to discharge Decreased caregiver support      Co-evaluation               End of Session Equipment Utilized During Treatment: Gait belt Activity Tolerance: Patient limited by fatigue;Patient limited by pain Patient left: in chair;with call bell/phone within reach;with chair alarm set Nurse Communication: Mobility status;Patient  requests pain meds         Time: 1020-1055 PT Time Calculation (min) (ACUTE ONLY): 35 min   Charges:   PT Evaluation $PT Eval Moderate Complexity: 1 Procedure PT Treatments $Therapeutic Activity: 8-22 mins   PT G Codes:        Denice Paradise 2016/05/05, 11:13 AM Ellias Mcelreath,PT Acute Rehabilitation [phone] (pager)

## 2016-04-19 NOTE — Progress Notes (Signed)
Patient ID: Travis Baetz Sr., male   DOB: Feb 26, 1951, 65 y.o.   MRN: [id number] Postoperative day 1 right tibial calcaneal fusion. Examination the wound VAC is functioning well discontinue the wound VAC at time of discharge. Patient should be nonweightbearing on the right lower extremity he showed a fracture boot in place during gait training. Plan to discontinue the wound VAC at time of discharge.

## 2016-04-20 LAB — GLUCOSE, CAPILLARY
GLUCOSE-CAPILLARY: 123 mg/dL — AB (ref 65–99)
Glucose-Capillary: 101 mg/dL — ABNORMAL HIGH (ref 65–99)
Glucose-Capillary: 143 mg/dL — ABNORMAL HIGH (ref 65–99)
Glucose-Capillary: 104 mg/dL — ABNORMAL HIGH (ref 65–99)

## 2016-04-20 NOTE — Progress Notes (Signed)
Report given to Donald Pore nurse.

## 2016-04-20 NOTE — Progress Notes (Signed)
Patient ID: Travis Soffer Sr., male   DOB: January 15, 1951, 65 y.o.   MRN: [id number] Postoperative day 2 tibial calcaneal fusion on the right. Examination patient still has drainage through the wound VAC. We will plan to discontinue the wound VAC tomorrow and discharge to skilled nursing facility tomorrow.

## 2016-04-21 LAB — GLUCOSE, CAPILLARY
GLUCOSE-CAPILLARY: 102 mg/dL — AB (ref 65–99)
Glucose-Capillary: 92 mg/dL (ref 65–99)
Glucose-Capillary: 112 mg/dL — ABNORMAL HIGH (ref 65–99)

## 2016-04-21 MED ORDER — OXYCODONE-ACETAMINOPHEN 10-325 MG PO TABS: 1.0000 | ORAL_TABLET | ORAL | 0 refills | Status: DC | PRN

## 2016-04-21 NOTE — Clinical Social Work Placement (Signed)
   CLINICAL SOCIAL WORK PLACEMENT  NOTE 04/21/16 - DISCHARGED TO [facility]  Date:  04/21/2016  Patient Details  Name: Travis Mancillas Sr. MRN: [id number] Date of Birth: 1950-12-19  Clinical Social Work is seeking post-discharge placement for this patient at the [place] level of care (*CSW will initial, date and re-position this form in  chart as items are completed):      Patient/family provided with [facility] Work Department's list of facilities offering this level of care within the geographic area requested by the patient (or if unable, by the patient's family).      Patient/family informed of their freedom to choose among providers that offer the needed level of care, that participate in Medicare, Medicaid or managed care program needed by the patient, have an available bed and are willing to accept the patient.      Patient/family informed of [facility]'s ownership interest in [facility] and [facility], as well as of the fact that they are under no obligation to receive care at these facilities.  PASRR submitted to EDS on       PASRR number received on       Existing PASRR number confirmed on 04/18/16     FL2 transmitted to all facilities in geographic area requested by pt/family on 04/18/16     FL2 transmitted to all facilities within larger geographic area on       Patient informed that his/her managed care company has contracts with or will negotiate with certain facilities, including the following:        Yes   Patient/family informed of bed offers received.  Patient chooses bed at [facility]     Physician recommends and patient chooses bed at      Patient to be transferred to [facility] on 04/21/16.  Patient to be transferred to facility by Ambulance     Patient family notified on 04/21/16 of transfer.  Name of family member notified:  Patient reported that he will contact his brother, Travis [name]     PHYSICIAN Please sign FL2     Additional Comment:    _______________________________________________ Sable Feil, LCSW 04/21/2016, 3:07 PM

## 2016-04-21 NOTE — Care Management Important Message (Signed)
Important Message  Patient Details  Name: Travis Beckom Sr. MRN: [id number] Date of Birth: [birth year]   Medicare Important Message Given:  Yes    Travis [name] 04/21/2016, 1:20 PM

## 2016-04-21 NOTE — Discharge Summary (Signed)
Discharge Diagnoses:  Active Problems:   S/P ankle fusion  Charcot collapse right ankle Surgeries: Procedure(s): Right Ankle Tibiocalcaneal Fusion on 04/18/2016    Consultants:  none  Discharged Condition: Improved, discharged to skilled nursing  Hospital Course: Travis [name]. is an 65 y.o. male who was admitted 04/18/2016 with a chief complaint of Charcot collapse right ankle status post open reduction internal fixation., with a final diagnosis of Charcot Collapse Right Ankle.  Patient was brought to the operating room on 04/18/2016 and underwent Procedure(s): Right Ankle Tibiocalcaneal Fusion.    Patient was given perioperative antibiotics: Anti-infectives    Start     Dose/Rate Route Frequency Ordered Stop   04/18/16 1715  ceFAZolin (ANCEF) IVPB 2g/100 mL premix     2 g 200 mL/hr over 30 Minutes Intravenous Every 6 hours 04/18/16 1702 04/19/16 0553   04/18/16 0830  ceFAZolin (ANCEF) IVPB 2g/100 mL premix     2 g 200 mL/hr over 30 Minutes Intravenous On call to O.R. 04/18/16 0830 04/18/16 1147    .  Patient was given sequential compression devices, early ambulation, and aspirin for DVT prophylaxis.  Recent vital signs: Patient Vitals for the past 24 hrs:  BP Temp Temp src Pulse Resp SpO2  04/21/16 0605 112/64 99.6 F (37.6 C) Oral 71 18 96 %  04/20/16 2210 (!) 110/48 99.9 F (37.7 C) Oral 80 18 97 %  04/20/16 1248 (!) 122/50 99.3 F (37.4 C) Oral 80 - 100 %  .  Recent laboratory studies: No results found.  Discharge Medications:     Medication List    STOP taking these medications   oxycodone 30 MG immediate release tablet Commonly known as:  ROXICODONE     TAKE these medications   AMITIZA 24 MCG capsule Generic drug:  lubiprostone Take 24 mcg by mouth 2 (two) times daily with a meal.   ARIPiprazole 5 MG tablet Commonly known as:  ABILIFY Take 1 tablet (5 mg total) by mouth daily. Take one tablet at bedtime   aspirin 81 MG tablet Take 81 mg by  mouth daily.   atorvastatin 20 MG tablet Commonly known as:  LIPITOR TAKE 1 TABLET BY MOUTH EVERY DAY   clonazePAM 1 MG tablet Commonly known as:  KLONOPIN TAKE 1 TABLET BY MOUTH AT BEDTIME AS NEEDED FOR ANXIETY   cyclobenzaprine 10 MG tablet Commonly known as:  FLEXERIL Take 10 mg by mouth 3 (three) times daily as needed for muscle spasms.   Fish Oil 1000 MG Caps Take 1,000 mg by mouth daily.   FLUoxetine 40 MG capsule Commonly known as:  PROZAC TAKE 1 CAPSULE(40 MG) BY MOUTH DAILY   LYRICA 100 MG capsule Generic drug:  pregabalin Take 1 capsule (100 mg total) by mouth 3 (three) times daily.   metFORMIN 500 MG tablet Commonly known as:  GLUCOPHAGE Take 1 tablet (500 mg total) by mouth 2 (two) times daily with a meal.   oxyCODONE-acetaminophen 10-325 MG tablet Commonly known as:  PERCOCET Take 1 tablet by mouth every 4 (four) hours as needed for pain.   prasugrel 5 MG Tabs tablet Commonly known as:  EFFIENT Take 1 tablet (5 mg total) by mouth daily.   tamsulosin 0.4 MG Caps capsule Commonly known as:  FLOMAX Take 0.4 mg by mouth daily after breakfast.   traZODone 100 MG tablet Commonly known as:  DESYREL Take 1 tablet (100 mg total) by mouth at bedtime.   triamcinolone 55 MCG/ACT Aero nasal inhaler Commonly known as:  NASACORT AQ Place 2 sprays into the nose daily.       Diagnostic Studies: Xr Ankle Complete Right  Result Date: 04/11/2016 Three-view radiographs of the right ankle shows Charcot collapse with dislocation of the right ankle with failure of the retained hardware. Patient has valgus alignment of the ankle and external rotation of the foot.   Patient benefited maximally from their hospital stay and there were no complications.     Disposition: 03-Skilled Nursing Facility Discharge Instructions    Call MD / Call 911    Complete by:  As directed    If you experience chest pain or shortness of breath, CALL 911 and be transported to the hospital  emergency room.  If you develope a fever above 101 F, pus (white drainage) or increased drainage or redness at the wound, or calf pain, call your surgeon's office.   Constipation Prevention    Complete by:  As directed    Drink plenty of fluids.  Prune juice may be helpful.  You may use a stool softener, such as Colace (over the counter) 100 mg twice a day.  Use MiraLax (over the counter) for constipation as needed.   Diet - low sodium heart healthy    Complete by:  As directed    Increase activity slowly as tolerated    Complete by:  As directed      Follow-up Information    Newt Minion, MD Follow up in 2 week(s).   Specialty:  Orthopedic Surgery Contact information: [place] [address] [phone]            Signed: Newt Minion 04/21/2016, 6:40 AM

## 2016-04-21 NOTE — Progress Notes (Signed)
Physical Therapy Treatment Patient Details Name: Travis [name]. MRN: [id number] DOB: [birth year] Today's Date: 04/21/2016    History of Present Illness Travis [name]. is a  65 y.o. male with a diagnosis of Charcot Collapse Right Ankle.  S/p tibiocalcaneal fusion.      PT Comments    Pt admitted with above diagnosis. Pt currently with functional limitations due to strength, endurance and balance deficits. Pt was in pain therefore called for pain meds.  Pt states he is discharging to SNF today and did not want to get up therefore performed exercises only.   Pt will benefit from skilled PT to increase their independence and safety with mobility to allow discharge to the venue listed below.    Follow Up Recommendations  SNF;Supervision/Assistance - 24 hour     Equipment Recommendations  Other (comment) (TBA)    Recommendations for Other Services       Precautions / Restrictions Precautions Precautions: Fall Required Braces or Orthoses: Other Brace/Splint (CAM walker) Restrictions Weight Bearing Restrictions: Yes RLE Weight Bearing: Non weight bearing    Mobility  Bed Mobility Overal bed mobility: Needs Assistance             General bed mobility comments: Pt was slid down to end of bed on arrival  with feet touching end of bed.  Pt states he was really uncomfortable.  Moved bed to trendelenberg position and pt was able to slide himself up to [facility] without physical assist with incr time.   Pt was appreciative as PT propped right LE on pillow and pt was much more comfortable with positioning.   Transfers Overall transfer level: Needs assistance Equipment used: Rolling walker (2 wheeled)   Sit to Stand: Mod assist;+2 safety/equipment;From elevated surface (bed was raised significantly) Stand pivot transfers: Mod assist;+2 safety/equipment       General transfer comment: Pt was able to stand to RW and was able to maintain NWB right LE with close supervision of  weight bearing with 2nd person ready to hold right LE prn.  Pt able to push on Ues to pivot to recliner with RW.    Ambulation/Gait                 Stairs            Wheelchair Mobility    Modified Rankin (Stroke Patients Only)       Balance                                    Cognition Arousal/Alertness: Awake/alert Behavior During Therapy: WFL for tasks assessed/performed Overall Cognitive Status: Within Functional Limits for tasks assessed                      Exercises General Exercises - Lower Extremity Quad Sets: AROM;Both;10 reps;Seated Gluteal Sets: AROM;Both;10 reps;Seated Heel Slides: AROM;Both;5 reps;Seated Straight Leg Raises: AROM;Both;5 reps;Seated    General Comments General comments (skin integrity, edema, etc.): Pt declined OOB as he states he is leaving and in pain.  Agreed to LE exercises.       Pertinent Vitals/Pain Pain Assessment: 0-10 Pain Score: 6  Pain Location: right ankle Pain Descriptors / Indicators: Sharp;Burning Pain Intervention(s): Limited activity within patient's tolerance;Monitored during session;Repositioned;Patient requesting pain meds-RN notified  VSS    Home Living  Prior Function            PT Goals (current goals can now be found in the care plan section) Acute Rehab PT Goals Patient Stated Goal: to get better Progress towards PT goals: Progressing toward goals    Frequency    Min 3X/week      PT Plan Current plan remains appropriate    Co-evaluation             End of Session Equipment Utilized During Treatment: Gait belt Activity Tolerance: Patient limited by fatigue;Patient limited by pain Patient left: with call bell/phone within reach;in bed;with SCD's reapplied     Time: 1130-1140 PT Time Calculation (min) (ACUTE ONLY): 10 min  Charges:  $Therapeutic Exercise: 8-22 mins                    G Codes:      Denice Paradise 22-Apr-2016, 11:49 AM Amanda Cockayne Acute Rehabilitation [phone] (pager)

## 2016-04-21 NOTE — Discharge Planning (Signed)
Patient discharged to SNF in stable condition.

## 2016-05-02 ENCOUNTER — Ambulatory Visit (INDEPENDENT_AMBULATORY_CARE_PROVIDER_SITE_OTHER): Payer: Medicare PPO | Admitting: Orthopedic Surgery

## 2016-05-02 ENCOUNTER — Ambulatory Visit (INDEPENDENT_AMBULATORY_CARE_PROVIDER_SITE_OTHER): Payer: Medicare PPO

## 2016-05-02 ENCOUNTER — Encounter (INDEPENDENT_AMBULATORY_CARE_PROVIDER_SITE_OTHER): Payer: Self-pay | Admitting: Orthopedic Surgery

## 2016-05-02 DIAGNOSIS — Z981 Arthrodesis status: Secondary | ICD-10-CM | POA: Diagnosis not present

## 2016-05-02 NOTE — Progress Notes (Signed)
Office Visit Note   Patient: Travis Salsgiver Sr.           Date of Birth: 11/08/1950           MRN: [id number] Visit Date: 05/02/2016              Requested by: [name], MD [place], [place] [id number] PCP: [name], MD   Assessment & Plan: Visit Diagnoses:  1. S/P ankle fusion     Plan: Continue wound care with dialysis of cleansing daily 4 x 4 plus Ace wrap dressing change continue nonweightbearing on the right lower extremity. Follow-up in 1 week to harvest the sutures from the 3 incisions. Once the sutures are harvested we will need to get him into compression stockings.  Follow-Up Instructions: Return in about 1 week (around 05/09/2016).   Orders:  Orders Placed This Encounter  Procedures  . XR Ankle Complete Right   No orders of the defined types were placed in this encounter.     Procedures: No procedures performed   Clinical Data: No additional findings.   Subjective: Chief Complaint  Patient presents with  . Right Ankle - Routine Post Op    Right ankle tibiocalcaneal fusion 04/18/16 2 weeks post op.    Patient is here for two week follow up right tibiocalcaneal fusion 04/18/16. Patient is residing at [facility] skilled nursing facility. Patient is nonweightbearing in wheelchair. There is bleeding lateral incision. Dry dressing changes done daily. Sutures are intact, swelling is present.    Review of Systems   Objective: Vital Signs: There were no vitals taken for this visit.  Physical Exam on examination patient's foot is plantigrade he has a little bit of a drainage from the lateral incision with some resolving hematoma with serosanguineous drainage. The wound is opened up proximally a centimeter by 3 mm. There is no cellulitis no redness no signs of infection. The skin over the medial malleolus is improving nicely with no ischemic changes no skin breakdown.  Ortho Exam  Specialty Comments:  No specialty comments  available.  Imaging: Xr Ankle Complete Right  Result Date: 05/02/2016 Three-view radiographs of the right ankle show stable tibial calcaneal fusion the ankle is well aligned in both AP and lateral planes.    PMFS History: Patient Active Problem List   Diagnosis Date Noted  . S/P ankle fusion 04/18/2016  . Charcot's arthropathy associated with type 2 diabetes mellitus ([place]) 04/11/2016  . Mechanical breakdown of internal fixation device of bone of right lower leg ([place]) 04/11/2016  . Encounter for well adult exam with abnormal findings 11/05/2015  . Major depression 09/13/2015  . S/P TKR (total knee replacement) bilaterally 09/13/2015  . GERD (gastroesophageal reflux disease) 09/08/2015  . Open ankle fracture 09/02/2015  . Fracture dislocation of ankle joint 09/02/2015  . S/P laparoscopic hernia repair 05/11/2015  . PPD positive 04/08/2015  . Benign neoplasm of descending colon   . Benign neoplasm of cecum   . AKI (acute kidney injury) ([place]) 10/18/2014  . Acute blood loss anemia   . Chronic anticoagulation   . Closed fracture of right foot 10/17/2014  . Occult blood positive stool 10/17/2014  . General weakness 07/14/2014  . Urinary incontinence 07/14/2014  . Arthritis of foot, right, degenerative 04/15/2014  . Headache(784.0) 10/15/2013  . Charcot's arthropathy, diabetic ([place]) 10/04/2013  . Spinal stenosis in cervical region 09/26/2013    Class: Chronic  . Spinal stenosis, lumbar region, with neurogenic claudication 09/26/2013  Class: Chronic  . Hand joint pain 06/10/2013  . Rotator cuff tear arthropathy of both shoulders 06/10/2013  . Skin lesion of cheek 05/01/2013  . Pain of right thumb 04/03/2013  . Balance disorder 03/12/2013  . Gait disorder 03/12/2013  . Tremor 03/12/2013  . Left hip pain 03/12/2013  . Pre-ulcerative corn or callous 02/06/2013  . Anxiety 11/12/2011  . OSA (obstructive sleep apnea) 11/07/2011  . Bradycardia 10/20/2011  . Insomnia 10/04/2011    . Obesity 01/12/2011  . ERECTILE DYSFUNCTION, ORGANIC 05/30/2010  . [place] DISEASE, LUMBAR 04/19/2010  . SCIATICA, LEFT 04/19/2010  . Chronic pain syndrome 10/27/2009  . Hyperlipidemia 07/15/2009  . Essential hypertension 06/24/2009  . CAD (coronary artery disease) 06/24/2009  . Allergic rhinitis 06/24/2009  . URETHRAL STRICTURE 06/24/2009  . DEGENERATIVE JOINT DISEASE 06/24/2009  . SHOULDER PAIN, BILATERAL 06/24/2009  . FATIGUE 06/24/2009  . NEPHROLITHIASIS, HX OF 06/24/2009   Past Medical History:  Diagnosis Date  . ALLERGIC RHINITIS 06/24/2009  . Allergic rhinitis 06/24/2009   Qualifier: Diagnosis of  By: [name], [name]   . Anxiety 11/12/2011   Adequate for discharge   . Arthritis    "all my joints" (09/30/2013)  . Arthritis of foot, right, degenerative 04/15/2014  . Balance disorder 03/12/2013  . Benign neoplasm of cecum   . Benign neoplasm of descending colon   . CAD (coronary artery disease) 06/24/2009   5 stents placed in 2007    . Chronic anticoagulation   . Chronic pain syndrome 10/27/2009   of ankle, shoulders, low back.  sciatica.   . Closed fracture of right foot 10/17/2014  . CORONARY ARTERY DISEASE 06/24/2009   a. s/p multiple PCIs - In 2008 he had a Taxus DES to the mild LAD, Endeavor DES to mid LCX and distal LCX. In January 2009 he had DES to distal LCX, mid LCX and proximal LCX. In November 2009 had BMS x 2 to the mid RCA. Cath 10/2011 with patent stents, noncardiac CP. LHC 01/2013: patent stents (noncardiac CP).  . DEGENERATIVE JOINT DISEASE 06/24/2009   Qualifier: Diagnosis of  By: [name], [name]   . Depression   . Depression with anxiety    Prior suicide attempt  . [place] DISEASE, LUMBAR 04/19/2010  . ERECTILE DYSFUNCTION, ORGANIC 05/30/2010  . Essential hypertension 06/24/2009   Qualifier: Diagnosis of  By: [name], [name]   . Fibromyalgia   . Fracture dislocation of ankle joint 09/02/2015  . Gait disorder 03/12/2013  . General weakness 07/14/2014  . GERD  (gastroesophageal reflux disease) 09/08/2015  . Hand joint pain 06/10/2013  . Heart murmur   . Hepatitis C   . History of kidney stones   . Hyperlipidemia 07/15/2009   Qualifier: Diagnosis of  By: Aundra Dubin, MD, [place]    . HYPERLIPIDEMIA-MIXED 07/15/2009  . HYPERTENSION 06/24/2009  . Insomnia 10/04/2011  . Irregular heart beat   . Left hip pain 03/12/2013   Injected under ultrasound guidance on June 24, 2013   . Major depression 09/13/2015  . Myocardial infarction 2008  . Non-cardiac chest pain 10/2011, 01/2013  . Obesity   . Occult blood positive stool 10/17/2014  . Open ankle fracture 09/02/2015  . OSA (obstructive sleep apnea)    not using CPAP (09/30/2013)  . Pain of right thumb 04/03/2013  . Pneumonia   . PPD positive 04/08/2015  . Pre-ulcerative corn or callous 02/06/2013  . Restless legs   . Rotator cuff tear arthropathy of both shoulders 06/10/2013  History of bilateral shoulder cuff surgery for rotator cuff tears. Reports increase in pain 09/11/2015 during physical therapy of the left shoulder.   [name] SCIATICA, LEFT 04/19/2010   Qualifier: Diagnosis of  By: [name], [name]   . Spinal stenosis in cervical region 09/26/2013  . Spinal stenosis, lumbar region, with neurogenic claudication 09/26/2013  . Type II diabetes mellitus ([place]) 2012   no meds in 09/2014.   [name] Uncontrolled type 2 DM with peripheral circulatory disorder ([place]) 10/04/2013  . URETHRAL STRICTURE 06/24/2009   self catheterizes.     Family History  Problem Relation Age of Onset  . Depression Mother   . Heart disease Mother   . Hypertension Mother   . Cancer Mother     Breast  . Diabetes Father   . Heart disease Father     CABG  . Cancer Father     prostate and skin cancer  . Hypertension Father   . Hyperlipidemia Father   . Depression Brother     x 2  . Hypertension Brother     x2  . Heart disease Maternal Grandfather   . Early death Maternal Grandfather 73    heart attack  . Early death Paternal Grandfather     . Coronary artery disease Other   . Hypertension Other   . Depression Other     Past Surgical History:  Procedure Laterality Date  . ANKLE FUSION Right 04/15/2014   Procedure: Right Subtalar, Talonavicular Fusion;  Surgeon: Newt Minion, MD;  Location: [facility];  Service: Orthopedics;  Laterality: Right;  . ANKLE FUSION Right 04/18/2016   Right Ankle Tibiocalcaneal Fusion; Removal of deep retained hardware; application of wound vac/notes 04/18/2016  . ANKLE FUSION Right 04/18/2016   Procedure: Right Ankle Tibiocalcaneal Fusion;  Surgeon: Newt Minion, MD;  Location: [facility];  Service: Orthopedics;  Laterality: Right;  . ANTERIOR CERVICAL DECOMP/DISCECTOMY FUSION N/A 09/26/2013   Procedure: ANTERIOR CERVICAL DISCECTOMY FUSION C3-4, plate and screw fixation, allograft bone graft;  Surgeon: Jessy Oto, MD;  Location: [facility];  Service: Orthopedics;  Laterality: N/A;  . BACK SURGERY    . CARDIAC CATHETERIZATION  X 1  . CARPAL TUNNEL RELEASE Bilateral   . COLONOSCOPY    . COLONOSCOPY N/A 10/22/2014   Procedure: COLONOSCOPY;  Surgeon: Lafayette Dragon, MD;  Location: [facility] ENDOSCOPY;  Service: Endoscopy;  Laterality: N/A;  . CORONARY ANGIOPLASTY WITH STENT PLACEMENT     "I have 9 stents"  . ESOPHAGOGASTRODUODENOSCOPY N/A 10/19/2014   Procedure: ESOPHAGOGASTRODUODENOSCOPY (EGD);  Surgeon: Jerene Bears, MD;  Location: [facility] ENDOSCOPY;  Service: Endoscopy;  Laterality: N/A;  . FUSION OF TALONAVICULAR JOINT Right 04/15/2014   dr duda  . HERNIA REPAIR     umbilical  . INGUINAL HERNIA REPAIR Right 05/11/2015   Procedure: LAPAROSCOPIC REPAIR RIGHT  INGUINAL HERNIA;  Surgeon: Greer Pickerel, MD;  Location: [facility];  Service: General;  Laterality: Right;  . INSERTION OF MESH Right 05/11/2015   Procedure: INSERTION OF MESH;  Surgeon: Greer Pickerel, MD;  Location: [facility];  Service: General;  Laterality: Right;  . JOINT REPLACEMENT Bilateral    knees  . KNEE CARTILAGE SURGERY Right X 12   "~ 1/2 open; ~ 1/2 scopes"  .  KNEE CARTILAGE SURGERY Left X 3   "3 scopes"  . LEFT HEART CATHETERIZATION WITH CORONARY ANGIOGRAM N/A 02/10/2013   Procedure: LEFT HEART CATHETERIZATION WITH CORONARY ANGIOGRAM;  Surgeon: Burnell Blanks, MD;  Location: [facility] CATH LAB;  Service: Cardiovascular;  Laterality: N/A;  . LUMBAR LAMINECTOMY/DECOMPRESSION MICRODISCECTOMY N/A 01/27/2014   Procedure: CENTRAL LUMBAR LAMINECTOMY L4-5 AND L3-4;  Surgeon: Jessy Oto, MD;  Location: [facility];  Service: Orthopedics;  Laterality: N/A;  . ORIF ANKLE FRACTURE Right 09/02/2015   Procedure: OPEN REDUCTION INTERNAL FIXATION (ORIF) ANKLE FRACTURE;  Surgeon: Newt Minion, MD;  Location: [facility];  Service: Orthopedics;  Laterality: Right;  . PERIPHERALLY INSERTED CENTRAL CATHETER INSERTION  09/02/2015  . SHOULDER ARTHROSCOPY W/ ROTATOR CUFF REPAIR Bilateral    "3 on the right; 1 on the left"  . SKIN SPLIT GRAFT Right 10/01/2015   Procedure: RIGHT ANKLE APPLY SKIN GRAFT SPLIT THICKNESS;  Surgeon: Newt Minion, MD;  Location: [facility];  Service: Orthopedics;  Laterality: Right;  . TONSILLECTOMY    . TOTAL KNEE ARTHROPLASTY Bilateral 2008  . UMBILICAL HERNIA REPAIR     UHR  . URETHRAL DILATION  X 4  . VASECTOMY     Social History   Occupational History  . disabled since 2006 due to ortho. heart, psych Unemployed  . part time work as an Multimedia programmer, wrestling, and baseball Agricultural consultant    Social History Main Topics  . Smoking status: Former Smoker    Types: Cigars    Quit date: 08/28/2010  . Smokeless tobacco: Never Used     Comment: 04/18/2016 "smoked 1 cigar/wk when I did smoke"  . Alcohol use No     Comment: rare (4 beers all summer)  None at all now (03/2014)  . Drug use: No  . Sexual activity: Yes

## 2016-05-09 ENCOUNTER — Ambulatory Visit (INDEPENDENT_AMBULATORY_CARE_PROVIDER_SITE_OTHER): Payer: Medicare PPO | Admitting: Orthopedic Surgery

## 2016-05-11 ENCOUNTER — Encounter (INDEPENDENT_AMBULATORY_CARE_PROVIDER_SITE_OTHER): Payer: Self-pay | Admitting: Orthopedic Surgery

## 2016-05-11 ENCOUNTER — Ambulatory Visit (INDEPENDENT_AMBULATORY_CARE_PROVIDER_SITE_OTHER): Payer: Medicare PPO | Admitting: Orthopedic Surgery

## 2016-05-11 DIAGNOSIS — T84116S Breakdown (mechanical) of internal fixation device of bone of right lower leg, sequela: Secondary | ICD-10-CM

## 2016-05-11 MED ORDER — DOXYCYCLINE HYCLATE 100 MG PO TABS: 100.0000 mg | ORAL_TABLET | Freq: Two times a day (BID) | ORAL | 0 refills | Status: DC

## 2016-05-11 NOTE — Progress Notes (Signed)
Office Visit Note   Patient: Travis Szymczak Sr.           Date of Birth: 1950-10-10           MRN: [id number] Visit Date: 05/11/2016              Requested by: [name], MD [place], [place] [id number] PCP: [name], MD   Assessment & Plan: Visit Diagnoses:  1. Mechanical breakdown of internal fixation device of bone of right lower leg, sequela     Plan: Still a little bit of serosanguineous drainage through the mid aspect of the incision. We will start him on some doxycycline continue nonweightbearing give him a new larger fracture boot and plan for follow-up in 1 week to harvest the sutures.  Follow-Up Instructions: Return in about 1 week (around 05/18/2016).   Orders:  No orders of the defined types were placed in this encounter.  Meds ordered this encounter  Medications  . doxycycline (VIBRA-TABS) 100 MG tablet    Sig: Take 1 tablet (100 mg total) by mouth 2 (two) times daily.    Dispense:  60 tablet    Refill:  0      Procedures: No procedures performed   Clinical Data: No additional findings.   Subjective: Chief Complaint  Patient presents with  . Right Ankle - Follow-up    Patient is S/P Right ankle fusion. Ambulates in wheelchair. Bumped foot into wall on Tuesday. NWB. He has some numbness and burning. Pain level 6/10. Pain is constant. He gets muscle spasms at times and last a few seconds only.  Took wrap off and he has some drainage. They change the dressing everyday at Facility. Stitches intact.     Review of Systems   Objective: Vital Signs: There were no vitals taken for this visit.  Physical Exam examination there is no cellulitis there is some clear serous sanguinous drainage. The wound has broken down at about 2 x 5 mm.  Ortho Exam  Specialty Comments:  No specialty comments available.  Imaging: No results found.   PMFS History: Patient Active Problem List   Diagnosis Date Noted  . S/P ankle fusion  04/18/2016  . Charcot's arthropathy associated with type 2 diabetes mellitus ([place]) 04/11/2016  . Mechanical breakdown of internal fixation device of bone of right lower leg ([place]) 04/11/2016  . Encounter for well adult exam with abnormal findings 11/05/2015  . Major depression 09/13/2015  . S/P TKR (total knee replacement) bilaterally 09/13/2015  . GERD (gastroesophageal reflux disease) 09/08/2015  . Open ankle fracture 09/02/2015  . Fracture dislocation of ankle joint 09/02/2015  . S/P laparoscopic hernia repair 05/11/2015  . PPD positive 04/08/2015  . Benign neoplasm of descending colon   . Benign neoplasm of cecum   . AKI (acute kidney injury) ([place]) 10/18/2014  . Acute blood loss anemia   . Chronic anticoagulation   . Closed fracture of right foot 10/17/2014  . Occult blood positive stool 10/17/2014  . General weakness 07/14/2014  . Urinary incontinence 07/14/2014  . Arthritis of foot, right, degenerative 04/15/2014  . Headache(784.0) 10/15/2013  . Charcot's arthropathy, diabetic ([place]) 10/04/2013  . Spinal stenosis in cervical region 09/26/2013    Class: Chronic  . Spinal stenosis, lumbar region, with neurogenic claudication 09/26/2013    Class: Chronic  . Hand joint pain 06/10/2013  . Rotator cuff tear arthropathy of both shoulders 06/10/2013  . Skin lesion of cheek 05/01/2013  . Pain  of right thumb 04/03/2013  . Balance disorder 03/12/2013  . Gait disorder 03/12/2013  . Tremor 03/12/2013  . Left hip pain 03/12/2013  . Pre-ulcerative corn or callous 02/06/2013  . Anxiety 11/12/2011  . OSA (obstructive sleep apnea) 11/07/2011  . Bradycardia 10/20/2011  . Insomnia 10/04/2011  . Obesity 01/12/2011  . ERECTILE DYSFUNCTION, ORGANIC 05/30/2010  . [place] DISEASE, LUMBAR 04/19/2010  . SCIATICA, LEFT 04/19/2010  . Chronic pain syndrome 10/27/2009  . Hyperlipidemia 07/15/2009  . Essential hypertension 06/24/2009  . CAD (coronary artery disease) 06/24/2009  . Allergic rhinitis  06/24/2009  . URETHRAL STRICTURE 06/24/2009  . DEGENERATIVE JOINT DISEASE 06/24/2009  . SHOULDER PAIN, BILATERAL 06/24/2009  . FATIGUE 06/24/2009  . NEPHROLITHIASIS, HX OF 06/24/2009   Past Medical History:  Diagnosis Date  . ALLERGIC RHINITIS 06/24/2009  . Allergic rhinitis 06/24/2009   Qualifier: Diagnosis of  By: [name], [name]   . Anxiety 11/12/2011   Adequate for discharge   . Arthritis    "all my joints" (09/30/2013)  . Arthritis of foot, right, degenerative 04/15/2014  . Balance disorder 03/12/2013  . Benign neoplasm of cecum   . Benign neoplasm of descending colon   . CAD (coronary artery disease) 06/24/2009   5 stents placed in 2007    . Chronic anticoagulation   . Chronic pain syndrome 10/27/2009   of ankle, shoulders, low back.  sciatica.   . Closed fracture of right foot 10/17/2014  . CORONARY ARTERY DISEASE 06/24/2009   a. s/p multiple PCIs - In 2008 he had a Taxus DES to the mild LAD, Endeavor DES to mid LCX and distal LCX. In January 2009 he had DES to distal LCX, mid LCX and proximal LCX. In November 2009 had BMS x 2 to the mid RCA. Cath 10/2011 with patent stents, noncardiac CP. LHC 01/2013: patent stents (noncardiac CP).  . DEGENERATIVE JOINT DISEASE 06/24/2009   Qualifier: Diagnosis of  By: [name], [name]   . Depression   . Depression with anxiety    Prior suicide attempt  . [place] DISEASE, LUMBAR 04/19/2010  . ERECTILE DYSFUNCTION, ORGANIC 05/30/2010  . Essential hypertension 06/24/2009   Qualifier: Diagnosis of  By: [name], [name]   . Fibromyalgia   . Fracture dislocation of ankle joint 09/02/2015  . Gait disorder 03/12/2013  . General weakness 07/14/2014  . GERD (gastroesophageal reflux disease) 09/08/2015  . Hand joint pain 06/10/2013  . Heart murmur   . Hepatitis C   . History of kidney stones   . Hyperlipidemia 07/15/2009   Qualifier: Diagnosis of  By: Aundra Dubin, MD, [place]    . HYPERLIPIDEMIA-MIXED 07/15/2009  . HYPERTENSION 06/24/2009  . Insomnia 10/04/2011  .  Irregular heart beat   . Left hip pain 03/12/2013   Injected under ultrasound guidance on June 24, 2013   . Major depression 09/13/2015  . Myocardial infarction 2008  . Non-cardiac chest pain 10/2011, 01/2013  . Obesity   . Occult blood positive stool 10/17/2014  . Open ankle fracture 09/02/2015  . OSA (obstructive sleep apnea)    not using CPAP (09/30/2013)  . Pain of right thumb 04/03/2013  . Pneumonia   . PPD positive 04/08/2015  . Pre-ulcerative corn or callous 02/06/2013  . Restless legs   . Rotator cuff tear arthropathy of both shoulders 06/10/2013   History of bilateral shoulder cuff surgery for rotator cuff tears. Reports increase in pain 09/11/2015 during physical therapy of the left shoulder.   [name] SCIATICA, LEFT 04/19/2010  Qualifier: Diagnosis of  By: [name], [name]   . Spinal stenosis in cervical region 09/26/2013  . Spinal stenosis, lumbar region, with neurogenic claudication 09/26/2013  . Type II diabetes mellitus ([place]) 2012   no meds in 09/2014.   [name] Uncontrolled type 2 DM with peripheral circulatory disorder ([place]) 10/04/2013  . URETHRAL STRICTURE 06/24/2009   self catheterizes.     Family History  Problem Relation Age of Onset  . Depression Mother   . Heart disease Mother   . Hypertension Mother   . Cancer Mother     Breast  . Diabetes Father   . Heart disease Father     CABG  . Cancer Father     prostate and skin cancer  . Hypertension Father   . Hyperlipidemia Father   . Depression Brother     x 2  . Hypertension Brother     x2  . Heart disease Maternal Grandfather   . Early death Maternal Grandfather 21    heart attack  . Early death Paternal Grandfather   . Coronary artery disease Other   . Hypertension Other   . Depression Other     Past Surgical History:  Procedure Laterality Date  . ANKLE FUSION Right 04/15/2014   Procedure: Right Subtalar, Talonavicular Fusion;  Surgeon: Newt Minion, MD;  Location: [facility];  Service: Orthopedics;  Laterality:  Right;  . ANKLE FUSION Right 04/18/2016   Right Ankle Tibiocalcaneal Fusion; Removal of deep retained hardware; application of wound vac/notes 04/18/2016  . ANKLE FUSION Right 04/18/2016   Procedure: Right Ankle Tibiocalcaneal Fusion;  Surgeon: Newt Minion, MD;  Location: [facility];  Service: Orthopedics;  Laterality: Right;  . ANTERIOR CERVICAL DECOMP/DISCECTOMY FUSION N/A 09/26/2013   Procedure: ANTERIOR CERVICAL DISCECTOMY FUSION C3-4, plate and screw fixation, allograft bone graft;  Surgeon: Jessy Oto, MD;  Location: [place] [facility];  Service: Orthopedics;  Laterality: N/A;  . BACK SURGERY    . CARDIAC CATHETERIZATION  X 1  . CARPAL TUNNEL RELEASE Bilateral   . COLONOSCOPY    . COLONOSCOPY N/A 10/22/2014   Procedure: COLONOSCOPY;  Surgeon: Lafayette Dragon, MD;  Location: [facility] ENDOSCOPY;  Service: Endoscopy;  Laterality: N/A;  . CORONARY ANGIOPLASTY WITH STENT PLACEMENT     "I have 9 stents"  . ESOPHAGOGASTRODUODENOSCOPY N/A 10/19/2014   Procedure: ESOPHAGOGASTRODUODENOSCOPY (EGD);  Surgeon: Jerene Bears, MD;  Location[facility] ENDOSCOPY;  Service: Endoscopy;  Laterality: N/A;  . FUSION OF TALONAVICULAR JOINT Right 04/15/2014   dr duda  . HERNIA REPAIR     umbilical  . INGUINAL HERNIA REPAIR Right 05/11/2015   Procedure: LAPAROSCOPIC REPAIR RIGHT  INGUINAL HERNIA;  Surgeon: Greer Pickerel, MD;  Location: [place];  Service: General;  Laterality: Right;  . INSERTION OF MESH Right 05/11/2015   Procedure: INSERTION OF MESH;  Surgeon: Greer Pickerel, MD;  Location: [place];  Service: General;  Laterality: Right;  . JOINT REPLACEMENT Bilateral    knees  . KNEE CARTILAGE SURGERY Right X 12   "~ 1/2 open; ~ 1/2 scopes"  . KNEE CARTILAGE SURGERY Left X 3   "3 scopes"  . LEFT HEART CATHETERIZATION WITH CORONARY ANGIOGRAM N/A 02/10/2013   Procedure: LEFT HEART CATHETERIZATION WITH CORONARY ANGIOGRAM;  Surgeon: Burnell Blanks, MD;  Location: [facility] CATH LAB;  Service: Cardiovascular;  Laterality: N/A;  . LUMBAR  LAMINECTOMY/DECOMPRESSION MICRODISCECTOMY N/A 01/27/2014   Procedure: CENTRAL LUMBAR LAMINECTOMY L4-5 AND L3-4;  Surgeon: Jessy Oto, MD;  Location: [facility]  OR;  Service: Orthopedics;  Laterality: N/A;  . ORIF ANKLE FRACTURE Right 09/02/2015   Procedure: OPEN REDUCTION INTERNAL FIXATION (ORIF) ANKLE FRACTURE;  Surgeon: Newt Minion, MD;  Location: [place];  Service: Orthopedics;  Laterality: Right;  . PERIPHERALLY INSERTED CENTRAL CATHETER INSERTION  09/02/2015  . SHOULDER ARTHROSCOPY W/ ROTATOR CUFF REPAIR Bilateral    "3 on the right; 1 on the left"  . SKIN SPLIT GRAFT Right 10/01/2015   Procedure: RIGHT ANKLE APPLY SKIN GRAFT SPLIT THICKNESS;  Surgeon: Newt Minion, MD;  Location: [place];  Service: Orthopedics;  Laterality: Right;  . TONSILLECTOMY    . TOTAL KNEE ARTHROPLASTY Bilateral 2008  . UMBILICAL HERNIA REPAIR     UHR  . URETHRAL DILATION  X 4  . VASECTOMY     Social History   Occupational History  . disabled since 2006 due to ortho. heart, psych Unemployed  . part time work as an Multimedia programmer, wrestling, and baseball Agricultural consultant    Social History Main Topics  . Smoking status: Former Smoker    Types: Cigars    Quit date: 08/28/2010  . Smokeless tobacco: Never Used     Comment: 04/18/2016 "smoked 1 cigar/wk when I did smoke"  . Alcohol use No     Comment: rare (4 beers all summer)  None at all now (03/2014)  . Drug use: No  . Sexual activity: Yes

## 2016-05-19 ENCOUNTER — Encounter (INDEPENDENT_AMBULATORY_CARE_PROVIDER_SITE_OTHER): Payer: Self-pay | Admitting: Orthopedic Surgery

## 2016-05-19 ENCOUNTER — Ambulatory Visit (INDEPENDENT_AMBULATORY_CARE_PROVIDER_SITE_OTHER): Payer: Medicare PPO

## 2016-05-19 ENCOUNTER — Ambulatory Visit (INDEPENDENT_AMBULATORY_CARE_PROVIDER_SITE_OTHER): Payer: Medicare PPO | Admitting: Family

## 2016-05-19 VITALS — Ht 73.0 in | Wt 291.0 lb

## 2016-05-19 DIAGNOSIS — Z981 Arthrodesis status: Secondary | ICD-10-CM

## 2016-05-19 DIAGNOSIS — M25571 Pain in right ankle and joints of right foot: Secondary | ICD-10-CM

## 2016-05-19 DIAGNOSIS — E11610 Type 2 diabetes mellitus with diabetic neuropathic arthropathy: Secondary | ICD-10-CM

## 2016-05-19 NOTE — Progress Notes (Signed)
Office Visit Note   Patient: Travis Collingsworth Sr.           Date of Birth: [birth year]           MRN: [id number] Visit Date: 05/19/2016              Requested by: [name], MD [place], [place] [address] PCP: [name], MD   Assessment & Plan: Visit Diagnoses:  1. S/P ankle fusion   2. Pain in right ankle and joints of right foot   3. Charcot's arthropathy associated with type 2 diabetes mellitus ([place])     Plan: Given a new vive compression stocking to wear daily,  Xxl. Continue with daily wound cleansing. Sutures harvested today. Follow up in 3 more weeks. Will discuss advancing weight bearing at that time.   Follow-Up Instructions: Return in about 3 weeks (around 06/09/2016).   Orders:  Orders Placed This Encounter  Procedures  . XR Ankle Complete Right   No orders of the defined types were placed in this encounter.     Procedures: No procedures performed   Clinical Data: No additional findings.   Subjective: Chief Complaint  Patient presents with  . Right Ankle - Routine Post Op    05/02/16 right ankle fusion s/p mechanical breakdown of internal fixation.    Patient is 2 weeks and 3 days s/p a right ankle fusion. He is non weight bearing in a wheelchair with a fracture boot. The stitches have been removed.  The pt has a small amount of pink yellow drainage and there is some swelling.      Review of Systems  Constitutional: Negative for chills and fever.     Objective: Vital Signs: Ht 6' 1"  (1.854 m)   Wt 291 lb (132 kg)   BMI 38.39 kg/m   Physical Exam  Constitutional: He is oriented to person, place, and time. He appears well-developed and well-nourished.  Pulmonary/Chest: Effort normal.  Neurological: He is alert and oriented to person, place, and time.  Skin:  Heel incision well healed. The lateral right ankle incision is nearly healed. There is a 5 mm in diameter area of hypergranulation tissue along central incision. No  drainage. No erythema. No sign of infection.  Does have pitting edema to the right lower extremity.   Psychiatric: He has a normal mood and affect.  Nursing note reviewed.   Ortho Exam  Specialty Comments:  No specialty comments available.  Imaging: Xr Ankle Complete Right  Result Date: 05/19/2016 Three views of ankle show stable alignment of fusion hardware. No complicating features.     PMFS History: Patient Active Problem List   Diagnosis Date Noted  . S/P ankle fusion 04/18/2016  . Charcot's arthropathy associated with type 2 diabetes mellitus ([place]) 04/11/2016  . Mechanical breakdown of internal fixation device of bone of right lower leg ([place]) 04/11/2016  . Encounter for well adult exam with abnormal findings 11/05/2015  . Major depression 09/13/2015  . S/P TKR (total knee replacement) bilaterally 09/13/2015  . GERD (gastroesophageal reflux disease) 09/08/2015  . Open ankle fracture 09/02/2015  . Fracture dislocation of ankle joint 09/02/2015  . S/P laparoscopic hernia repair 05/11/2015  . PPD positive 04/08/2015  . Benign neoplasm of descending colon   . Benign neoplasm of cecum   . AKI (acute kidney injury) ([place]) 10/18/2014  . Acute blood loss anemia   . Chronic anticoagulation   . Closed fracture of right foot 10/17/2014  .  Occult blood positive stool 10/17/2014  . General weakness 07/14/2014  . Urinary incontinence 07/14/2014  . Arthritis of foot, right, degenerative 04/15/2014  . Headache(784.0) 10/15/2013  . Spinal stenosis in cervical region 09/26/2013    Class: Chronic  . Spinal stenosis, lumbar region, with neurogenic claudication 09/26/2013    Class: Chronic  . Hand joint pain 06/10/2013  . Rotator cuff tear arthropathy of both shoulders 06/10/2013  . Skin lesion of cheek 05/01/2013  . Pain of right thumb 04/03/2013  . Balance disorder 03/12/2013  . Gait disorder 03/12/2013  . Tremor 03/12/2013  . Left hip pain 03/12/2013  . Pre-ulcerative corn  or callous 02/06/2013  . Anxiety 11/12/2011  . OSA (obstructive sleep apnea) 11/07/2011  . Bradycardia 10/20/2011  . Insomnia 10/04/2011  . Obesity 01/12/2011  . ERECTILE DYSFUNCTION, ORGANIC 05/30/2010  . [place] DISEASE, LUMBAR 04/19/2010  . SCIATICA, LEFT 04/19/2010  . Chronic pain syndrome 10/27/2009  . Hyperlipidemia 07/15/2009  . Essential hypertension 06/24/2009  . CAD (coronary artery disease) 06/24/2009  . Allergic rhinitis 06/24/2009  . URETHRAL STRICTURE 06/24/2009  . DEGENERATIVE JOINT DISEASE 06/24/2009  . SHOULDER PAIN, BILATERAL 06/24/2009  . FATIGUE 06/24/2009  . NEPHROLITHIASIS, HX OF 06/24/2009   Past Medical History:  Diagnosis Date  . ALLERGIC RHINITIS 06/24/2009  . Allergic rhinitis 06/24/2009   Qualifier: Diagnosis of  By: [name], [name]   . Anxiety 11/12/2011   Adequate for discharge   . Arthritis    "all my joints" (09/30/2013)  . Arthritis of foot, right, degenerative 04/15/2014  . Balance disorder 03/12/2013  . Benign neoplasm of cecum   . Benign neoplasm of descending colon   . CAD (coronary artery disease) 06/24/2009   5 stents placed in 2007    . Chronic anticoagulation   . Chronic pain syndrome 10/27/2009   of ankle, shoulders, low back.  sciatica.   . Closed fracture of right foot 10/17/2014  . CORONARY ARTERY DISEASE 06/24/2009   a. s/p multiple PCIs - In 2008 he had a Taxus DES to the mild LAD, Endeavor DES to mid LCX and distal LCX. In January 2009 he had DES to distal LCX, mid LCX and proximal LCX. In November 2009 had BMS x 2 to the mid RCA. Cath 10/2011 with patent stents, noncardiac CP. LHC 01/2013: patent stents (noncardiac CP).  . DEGENERATIVE JOINT DISEASE 06/24/2009   Qualifier: Diagnosis of  By: [name], [name]   . Depression   . Depression with anxiety    Prior suicide attempt  . [place] DISEASE, LUMBAR 04/19/2010  . ERECTILE DYSFUNCTION, ORGANIC 05/30/2010  . Essential hypertension 06/24/2009   Qualifier: Diagnosis of  By: [name], [name]   .  Fibromyalgia   . Fracture dislocation of ankle joint 09/02/2015  . Gait disorder 03/12/2013  . General weakness 07/14/2014  . GERD (gastroesophageal reflux disease) 09/08/2015  . Hand joint pain 06/10/2013  . Heart murmur   . Hepatitis C   . History of kidney stones   . Hyperlipidemia 07/15/2009   Qualifier: Diagnosis of  By: Aundra Dubin, MD, [place]    . HYPERLIPIDEMIA-MIXED 07/15/2009  . HYPERTENSION 06/24/2009  . Insomnia 10/04/2011  . Irregular heart beat   . Left hip pain 03/12/2013   Injected under ultrasound guidance on June 24, 2013   . Major depression 09/13/2015  . Myocardial infarction 2008  . Non-cardiac chest pain 10/2011, 01/2013  . Obesity   . Occult blood positive stool 10/17/2014  . Open ankle fracture 09/02/2015  .  OSA (obstructive sleep apnea)    not using CPAP (09/30/2013)  . Pain of right thumb 04/03/2013  . Pneumonia   . PPD positive 04/08/2015  . Pre-ulcerative corn or callous 02/06/2013  . Restless legs   . Rotator cuff tear arthropathy of both shoulders 06/10/2013   History of bilateral shoulder cuff surgery for rotator cuff tears. Reports increase in pain 09/11/2015 during physical therapy of the left shoulder.   [name] SCIATICA, LEFT 04/19/2010   Qualifier: Diagnosis of  By: [name], [name]   . Spinal stenosis in cervical region 09/26/2013  . Spinal stenosis, lumbar region, with neurogenic claudication 09/26/2013  . Type II diabetes mellitus ([place]) 2012   no meds in 09/2014.   [name] Uncontrolled type 2 DM with peripheral circulatory disorder ([place]) 10/04/2013  . URETHRAL STRICTURE 06/24/2009   self catheterizes.     Family History  Problem Relation Age of Onset  . Depression Mother   . Heart disease Mother   . Hypertension Mother   . Cancer Mother     Breast  . Diabetes Father   . Heart disease Father     CABG  . Cancer Father     prostate and skin cancer  . Hypertension Father   . Hyperlipidemia Father   . Depression Brother     x 2  . Hypertension Brother     x2  .  Heart disease Maternal Grandfather   . Early death Maternal Grandfather 44    heart attack  . Early death Paternal Grandfather   . Coronary artery disease Other   . Hypertension Other   . Depression Other     Past Surgical History:  Procedure Laterality Date  . ANKLE FUSION Right 04/15/2014   Procedure: Right Subtalar, Talonavicular Fusion;  Surgeon: Newt Minion, MD;  Location: [facility];  Service: Orthopedics;  Laterality: Right;  . ANKLE FUSION Right 04/18/2016   Right Ankle Tibiocalcaneal Fusion; Removal of deep retained hardware; application of wound vac/notes 04/18/2016  . ANKLE FUSION Right 04/18/2016   Procedure: Right Ankle Tibiocalcaneal Fusion;  Surgeon: Newt Minion, MD;  Location: [facility];  Service: Orthopedics;  Laterality: Right;  . ANTERIOR CERVICAL DECOMP/DISCECTOMY FUSION N/A 09/26/2013   Procedure: ANTERIOR CERVICAL DISCECTOMY FUSION C3-4, plate and screw fixation, allograft bone graft;  Surgeon: Jessy Oto, MD;  Location: [facility];  Service: Orthopedics;  Laterality: N/A;  . BACK SURGERY    . CARDIAC CATHETERIZATION  X 1  . CARPAL TUNNEL RELEASE Bilateral   . COLONOSCOPY    . COLONOSCOPY N/A 10/22/2014   Procedure: COLONOSCOPY;  Surgeon: Lafayette Dragon, MD;  Location[facility] ENDOSCOPY;  Service: Endoscopy;  Laterality: N/A;  . CORONARY ANGIOPLASTY WITH STENT PLACEMENT     "I have 9 stents"  . ESOPHAGOGASTRODUODENOSCOPY N/A 10/19/2014   Procedure: ESOPHAGOGASTRODUODENOSCOPY (EGD);  Surgeon: Jerene Bears, MD;  Location: [facility] ENDOSCOPY;  Service: Endoscopy;  Laterality: N/A;  . FUSION OF TALONAVICULAR JOINT Right 04/15/2014   dr duda  . HERNIA REPAIR     umbilical  . INGUINAL HERNIA REPAIR Right 05/11/2015   Procedure: LAPAROSCOPIC REPAIR RIGHT  INGUINAL HERNIA;  Surgeon: Greer Pickerel, MD;  Location: [facility];  Service: General;  Laterality: Right;  . INSERTION OF MESH Right 05/11/2015   Procedure: INSERTION OF MESH;  Surgeon: Greer Pickerel, MD;  Location: [place];  Service: General;   Laterality: Right;  . JOINT REPLACEMENT Bilateral    knees  . KNEE CARTILAGE SURGERY Right X 12   "~  1/2 open; ~ 1/2 scopes"  . KNEE CARTILAGE SURGERY Left X 3   "3 scopes"  . LEFT HEART CATHETERIZATION WITH CORONARY ANGIOGRAM N/A 02/10/2013   Procedure: LEFT HEART CATHETERIZATION WITH CORONARY ANGIOGRAM;  Surgeon: Burnell Blanks, MD;  Location: [facility] C[facility] LAB;  Service: Cardiovascular;  Laterality: N/A;  . LUMBAR LAMINECTOMY/DECOMPRESSION MICRODISCECTOMY N/A 01/27/2014   Procedure: CENTRAL LUMBAR LAMINECTOMY L4-5 AND L3-4;  Surgeon: Jessy Oto, MD;  Location: [facility];  Service: Orthopedics;  Laterality: N/A;  . ORIF ANKLE FRACTURE Right 09/02/2015   Procedure: OPEN REDUCTION INTERNAL FIXATION (ORIF) ANKLE FRACTURE;  Surgeon: Newt Minion, MD;  Location: [facility];  Service: Orthopedics;  Laterality: Right;  . PERIPHERALLY INSERTED CENTRAL CATHETER INSERTION  09/02/2015  . SHOULDER ARTHROSCOPY W/ ROTATOR CUFF REPAIR Bilateral    "3 on the right; 1 on the left"  . SKIN SPLIT GRAFT Right 10/01/2015   Procedure: RIGHT ANKLE APPLY SKIN GRAFT SPLIT THICKNESS;  Surgeon: Newt Minion, MD;  Location: [facility] [facility];  Service: Orthopedics;  Laterality: Right;  . TONSILLECTOMY    . TOTAL KNEE ARTHROPLASTY Bilateral 2008  . UMBILICAL HERNIA REPAIR     UHR  . URETHRAL DILATION  X 4  . VASECTOMY     Social History   Occupational History  . disabled since 2006 due to ortho. heart, psych Unemployed  . part time work as an Multimedia programmer, wrestling, and baseball Agricultural consultant    Social History Main Topics  . Smoking status: Former Smoker    Types: Cigars    Quit date: 08/28/2010  . Smokeless tobacco: Never Used     Comment: 04/18/2016 "smoked 1 cigar/wk when I did smoke"  . Alcohol use No     Comment: rare (4 beers all summer)  None at all now (03/2014)  . Drug use: No  . Sexual activity: Yes

## 2016-05-30 ENCOUNTER — Telehealth (INDEPENDENT_AMBULATORY_CARE_PROVIDER_SITE_OTHER): Payer: Self-pay | Admitting: Orthopedic Surgery

## 2016-05-31 NOTE — Telephone Encounter (Signed)
Cannot get number to dial out for home health nurse, tried and unable numerous times.

## 2016-06-01 ENCOUNTER — Telehealth (INDEPENDENT_AMBULATORY_CARE_PROVIDER_SITE_OTHER): Payer: Self-pay | Admitting: Orthopedic Surgery

## 2016-06-01 ENCOUNTER — Other Ambulatory Visit (INDEPENDENT_AMBULATORY_CARE_PROVIDER_SITE_OTHER): Payer: Self-pay

## 2016-06-01 NOTE — Telephone Encounter (Signed)
therapist called back with another working number this has been done.

## 2016-06-01 NOTE — Telephone Encounter (Signed)
I called physical  Therapist and advised ok for therapy as below and that an order for a knee scooter has been faxed for the pt. Fax # [phone]

## 2016-06-05 ENCOUNTER — Telehealth (INDEPENDENT_AMBULATORY_CARE_PROVIDER_SITE_OTHER): Payer: Self-pay | Admitting: Orthopedic Surgery

## 2016-06-05 NOTE — Telephone Encounter (Signed)
Occupational therapist with [facility] calling for verbal for 1x week for 1 wk and  2x 3 weeks after that.  [phone]

## 2016-06-06 NOTE — Telephone Encounter (Signed)
I called to advise ok verbal home health

## 2016-06-08 ENCOUNTER — Ambulatory Visit (INDEPENDENT_AMBULATORY_CARE_PROVIDER_SITE_OTHER): Payer: Medicare PPO | Admitting: Orthopedic Surgery

## 2016-06-09 ENCOUNTER — Telehealth (INDEPENDENT_AMBULATORY_CARE_PROVIDER_SITE_OTHER): Payer: Self-pay | Admitting: *Deleted

## 2016-06-09 ENCOUNTER — Ambulatory Visit (INDEPENDENT_AMBULATORY_CARE_PROVIDER_SITE_OTHER): Payer: Medicare PPO | Admitting: Orthopedic Surgery

## 2016-06-09 NOTE — Telephone Encounter (Signed)
Home health calling stating pt is in 8-10 pain and pt developed pressure wound. CB: [phone] Constance Haw

## 2016-06-09 NOTE — Telephone Encounter (Signed)
Can you please answer

## 2016-06-12 ENCOUNTER — Telehealth (INDEPENDENT_AMBULATORY_CARE_PROVIDER_SITE_OTHER): Payer: Self-pay | Admitting: Orthopedic Surgery

## 2016-06-12 ENCOUNTER — Ambulatory Visit (INDEPENDENT_AMBULATORY_CARE_PROVIDER_SITE_OTHER): Payer: Medicare PPO | Admitting: Orthopedic Surgery

## 2016-06-12 NOTE — Telephone Encounter (Signed)
Will you call and have this patient come in this week for evaluation

## 2016-06-12 NOTE — Telephone Encounter (Signed)
Tiffany calling from [facility], she is a nurse with patient now. States there is an opening of wound, he is currently wearing vive compression stocking. There is bloody drainage. He is unable to come in office today due to transportation.

## 2016-06-12 NOTE — Telephone Encounter (Signed)
Oh sorry margaret, did nt realise had an appointment already

## 2016-06-12 NOTE — Telephone Encounter (Signed)
Called [place] (home Health) advised her patient has an appointment today with Dr Sharol Given. She advised patient is also aware if his appointment today.

## 2016-06-13 ENCOUNTER — Ambulatory Visit (INDEPENDENT_AMBULATORY_CARE_PROVIDER_SITE_OTHER): Payer: Medicare PPO | Admitting: Orthopedic Surgery

## 2016-06-13 ENCOUNTER — Ambulatory Visit (INDEPENDENT_AMBULATORY_CARE_PROVIDER_SITE_OTHER): Payer: Medicare PPO | Admitting: Psychiatry

## 2016-06-13 ENCOUNTER — Encounter (INDEPENDENT_AMBULATORY_CARE_PROVIDER_SITE_OTHER): Payer: Self-pay | Admitting: Orthopedic Surgery

## 2016-06-13 ENCOUNTER — Encounter (HOSPITAL_COMMUNITY): Payer: Self-pay | Admitting: Psychiatry

## 2016-06-13 ENCOUNTER — Ambulatory Visit (INDEPENDENT_AMBULATORY_CARE_PROVIDER_SITE_OTHER): Payer: Medicare PPO

## 2016-06-13 VITALS — BP 136/70 | HR 62 | Ht 73.0 in | Wt 276.8 lb

## 2016-06-13 VITALS — Ht 73.0 in | Wt 276.0 lb

## 2016-06-13 DIAGNOSIS — Z803 Family history of malignant neoplasm of breast: Secondary | ICD-10-CM

## 2016-06-13 DIAGNOSIS — Z87891 Personal history of nicotine dependence: Secondary | ICD-10-CM

## 2016-06-13 DIAGNOSIS — Z8249 Family history of ischemic heart disease and other diseases of the circulatory system: Secondary | ICD-10-CM | POA: Diagnosis not present

## 2016-06-13 DIAGNOSIS — M25571 Pain in right ankle and joints of right foot: Secondary | ICD-10-CM | POA: Diagnosis not present

## 2016-06-13 DIAGNOSIS — Z9889 Other specified postprocedural states: Secondary | ICD-10-CM | POA: Diagnosis not present

## 2016-06-13 DIAGNOSIS — Z79899 Other long term (current) drug therapy: Secondary | ICD-10-CM

## 2016-06-13 DIAGNOSIS — Z818 Family history of other mental and behavioral disorders: Secondary | ICD-10-CM | POA: Diagnosis not present

## 2016-06-13 DIAGNOSIS — F319 Bipolar disorder, unspecified: Secondary | ICD-10-CM | POA: Diagnosis not present

## 2016-06-13 DIAGNOSIS — Z7982 Long term (current) use of aspirin: Secondary | ICD-10-CM

## 2016-06-13 DIAGNOSIS — Z833 Family history of diabetes mellitus: Secondary | ICD-10-CM

## 2016-06-13 MED ORDER — ARIPIPRAZOLE 5 MG PO TABS: 5.0000 mg | ORAL_TABLET | Freq: Every day | ORAL | 0 refills | Status: DC

## 2016-06-13 MED ORDER — TRAZODONE HCL 150 MG PO TABS: 150.0000 mg | ORAL_TABLET | Freq: Every day | ORAL | 0 refills | Status: DC

## 2016-06-13 MED ORDER — FLUOXETINE HCL 40 MG PO CAPS: ORAL_CAPSULE | ORAL | 0 refills | Status: DC

## 2016-06-13 MED ORDER — BENZTROPINE MESYLATE 0.5 MG PO TABS: 0.5000 mg | ORAL_TABLET | Freq: Every day | ORAL | 0 refills | Status: DC

## 2016-06-13 NOTE — Progress Notes (Signed)
BH MD/PA/NP OP Progress Note  06/13/2016 2:34 PM Travis Fortis Sr.  MRN:  [id number]  Chief Complaint:  Chief Complaint    Follow-up     Subjective:  I am in a lot of pain.  I have reconstructive surgery around Christmas time.  I don't see any improvement.  HPI: Travis [name] came for his follow-up appointment.  He is complaining of increase in irritability and depression due to increase in his pain.  He is sleeping 4-5 hours.  He is also frustrated because he did not get enough help from her niece who is living with her.  Though he is getting visiting nurse who is helping to adjust her medication but he was hoping to get more help from her niece.  He is doing twice a week physical therapy but he is disappointed with the progress.  He is scheduled to see his pain specialist today.  Patient is unable to afford to see a therapist.  He does not want to change his medication but like to get and a stronger medication to help sleep.  He admitted getting easily frustrated, irritable but denies any paranoia, hallucination, suicidal thoughts or homicidal thought.  He denies any self abusive behavior.  He is compliant with his medication.  He has mild tremors in his right hand.  He has difficulty walking and need a walker to walk around.  He denies any crying spells but endorsed some time hopeless.  His major concern is chronic pain and lack of sleep.  Patient denies drinking alcohol or using any illegal substances.  He is eating okay but he lost 15 pounds in past few months.  His energy level is fair.  Visit Diagnosis:    ICD-9-CM ICD-10-CM   1. Bipolar 1 disorder (HCC) 296.7 F31.9 traZODone (DESYREL) 150 MG tablet     FLUoxetine (PROZAC) 40 MG capsule     ARIPiprazole (ABILIFY) 5 MG tablet     benztropine (COGENTIN) 0.5 MG tablet    Past Psychiatric History: Reviewed. Patient has history of bipolar disorder and he is seeing psychiatrists since age 19.  He saw psychiatrist in [place] and [place].  He has  history of severe mood swings , anger issues, hallucination, irritability and poor impulse control and excessive spending. He married 5 times and he has 2 children but patient has no contact with them.He has one psychiatric hospitalization in 2014 when he took overdose on Xanax with alcohol.  He had at least history of 2 suicidal gesture in the past.  Patient also attended intensive outpatient program in the past.  He had tried Effexor, Xanax, Klonopin, Ambien , Lunesta, Wellbutrin, Ambien and Remeron.  He was started on Abilify from this office.   Past Medical History:  Past Medical History:  Diagnosis Date  . ALLERGIC RHINITIS 06/24/2009  . Allergic rhinitis 06/24/2009   Qualifier: Diagnosis of  By: [name], [name]   . Anxiety 11/12/2011   Adequate for discharge   . Arthritis    "all my joints" (09/30/2013)  . Arthritis of foot, right, degenerative 04/15/2014  . Balance disorder 03/12/2013  . Benign neoplasm of cecum   . Benign neoplasm of descending colon   . CAD (coronary artery disease) 06/24/2009   5 stents placed in 2007    . Chronic anticoagulation   . Chronic pain syndrome 10/27/2009   of ankle, shoulders, low back.  sciatica.   . Closed fracture of right foot 10/17/2014  . CORONARY ARTERY DISEASE 06/24/2009   a. s/p  multiple PCIs - In 2008 he had a Taxus DES to the mild LAD, Endeavor DES to mid LCX and distal LCX. In January 2009 he had DES to distal LCX, mid LCX and proximal LCX. In November 2009 had BMS x 2 to the mid RCA. Cath 10/2011 with patent stents, noncardiac CP. LHC 01/2013: patent stents (noncardiac CP).  . DEGENERATIVE JOINT DISEASE 06/24/2009   Qualifier: Diagnosis of  By: [name], [name]   . Depression   . Depression with anxiety    Prior suicide attempt  . [place] DISEASE, LUMBAR 04/19/2010  . ERECTILE DYSFUNCTION, ORGANIC 05/30/2010  . Essential hypertension 06/24/2009   Qualifier: Diagnosis of  By: [name], [name]   . Fibromyalgia   . Fracture dislocation of ankle joint  09/02/2015  . Gait disorder 03/12/2013  . General weakness 07/14/2014  . GERD (gastroesophageal reflux disease) 09/08/2015  . Hand joint pain 06/10/2013  . Heart murmur   . Hepatitis C   . History of kidney stones   . Hyperlipidemia 07/15/2009   Qualifier: Diagnosis of  By: Aundra Dubin, MD, [place]    . HYPERLIPIDEMIA-MIXED 07/15/2009  . HYPERTENSION 06/24/2009  . Insomnia 10/04/2011  . Irregular heart beat   . Left hip pain 03/12/2013   Injected under ultrasound guidance on June 24, 2013   . Major depression 09/13/2015  . Myocardial infarction 2008  . Non-cardiac chest pain 10/2011, 01/2013  . Obesity   . Occult blood positive stool 10/17/2014  . Open ankle fracture 09/02/2015  . OSA (obstructive sleep apnea)    not using CPAP (09/30/2013)  . Pain of right thumb 04/03/2013  . Pneumonia   . PPD positive 04/08/2015  . Pre-ulcerative corn or callous 02/06/2013  . Restless legs   . Rotator cuff tear arthropathy of both shoulders 06/10/2013   History of bilateral shoulder cuff surgery for rotator cuff tears. Reports increase in pain 09/11/2015 during physical therapy of the left shoulder.   [name] SCIATICA, LEFT 04/19/2010   Qualifier: Diagnosis of  By: [name], [name]   . Spinal stenosis in cervical region 09/26/2013  . Spinal stenosis, lumbar region, with neurogenic claudication 09/26/2013  . Type II diabetes mellitus ([place]) 2012   no meds in 09/2014.   [name] Uncontrolled type 2 DM with peripheral circulatory disorder ([place]) 10/04/2013  . URETHRAL STRICTURE 06/24/2009   self catheterizes.     Past Surgical History:  Procedure Laterality Date  . ANKLE FUSION Right 04/15/2014   Procedure: Right Subtalar, Talonavicular Fusion;  Surgeon: Newt Minion, MD;  Location: [place];  Service: Orthopedics;  Laterality: Right;  . ANKLE FUSION Right 04/18/2016   Right Ankle Tibiocalcaneal Fusion; Removal of deep retained hardware; application of wound vac/notes 04/18/2016  . ANKLE FUSION Right 04/18/2016   Procedure: Right  Ankle Tibiocalcaneal Fusion;  Surgeon: Newt Minion, MD;  Location: [place];  Service: Orthopedics;  Laterality: Right;  . ANTERIOR CERVICAL DECOMP/DISCECTOMY FUSION N/A 09/26/2013   Procedure: ANTERIOR CERVICAL DISCECTOMY FUSION C3-4, plate and screw fixation, allograft bone graft;  Surgeon: Jessy Oto, MD;  Location: [place];  Service: Orthopedics;  Laterality: N/A;  . BACK SURGERY    . CARDIAC CATHETERIZATION  X 1  . CARPAL TUNNEL RELEASE Bilateral   . COLONOSCOPY    . COLONOSCOPY N/A 10/22/2014   Procedure: COLONOSCOPY;  Surgeon: Lafayette Dragon, MD;  Location[facility] ENDOSCOPY;  Service: Endoscopy;  Laterality: N/A;  . CORONARY ANGIOPLASTY WITH STENT PLACEMENT     "I  have 9 stents"  . ESOPHAGOGASTRODUODENOSCOPY N/A 10/19/2014   Procedure: ESOPHAGOGASTRODUODENOSCOPY (EGD);  Surgeon: Jerene Bears, MD;  Location: [facility] ENDOSCOPY;  Service: Endoscopy;  Laterality: N/A;  . FUSION OF TALONAVICULAR JOINT Right 04/15/2014   dr duda  . HERNIA REPAIR     umbilical  . INGUINAL HERNIA REPAIR Right 05/11/2015   Procedure: LAPAROSCOPIC REPAIR RIGHT  INGUINAL HERNIA;  Surgeon: Greer Pickerel, MD;  Location: [place];  Service: General;  Laterality: Right;  . INSERTION OF MESH Right 05/11/2015   Procedure: INSERTION OF MESH;  Surgeon: Greer Pickerel, MD;  Location: [place];  Service: General;  Laterality: Right;  . JOINT REPLACEMENT Bilateral    knees  . KNEE CARTILAGE SURGERY Right X 12   "~ 1/2 open; ~ 1/2 scopes"  . KNEE CARTILAGE SURGERY Left X 3   "3 scopes"  . LEFT HEART CATHETERIZATION WITH CORONARY ANGIOGRAM N/A 02/10/2013   Procedure: LEFT HEART CATHETERIZATION WITH CORONARY ANGIOGRAM;  Surgeon: Burnell Blanks, MD;  Location: [facility] CATH LAB;  Service: Cardiovascular;  Laterality: N/A;  . LUMBAR LAMINECTOMY/DECOMPRESSION MICRODISCECTOMY N/A 01/27/2014   Procedure: CENTRAL LUMBAR LAMINECTOMY L4-5 AND L3-4;  Surgeon: Jessy Oto, MD;  Location: [place];  Service: Orthopedics;  Laterality: N/A;  . ORIF ANKLE  FRACTURE Right 09/02/2015   Procedure: OPEN REDUCTION INTERNAL FIXATION (ORIF) ANKLE FRACTURE;  Surgeon: Newt Minion, MD;  Location: [place];  Service: Orthopedics;  Laterality: Right;  . PERIPHERALLY INSERTED CENTRAL CATHETER INSERTION  09/02/2015  . SHOULDER ARTHROSCOPY W/ ROTATOR CUFF REPAIR Bilateral    "3 on the right; 1 on the left"  . SKIN SPLIT GRAFT Right 10/01/2015   Procedure: RIGHT ANKLE APPLY SKIN GRAFT SPLIT THICKNESS;  Surgeon: Newt Minion, MD;  Location: [place];  Service: Orthopedics;  Laterality: Right;  . TONSILLECTOMY    . TOTAL KNEE ARTHROPLASTY Bilateral 2008  . UMBILICAL HERNIA REPAIR     UHR  . URETHRAL DILATION  X 4  . VASECTOMY      Family Psychiatric History: Reviewed.  Family History:  Family History  Problem Relation Age of Onset  . Depression Mother   . Heart disease Mother   . Hypertension Mother   . Cancer Mother     Breast  . Diabetes Father   . Heart disease Father     CABG  . Cancer Father     prostate and skin cancer  . Hypertension Father   . Hyperlipidemia Father   . Depression Brother     x 2  . Hypertension Brother     x2  . Heart disease Maternal Grandfather   . Early death Maternal Grandfather 59    heart attack  . Early death Paternal Grandfather   . Coronary artery disease Other   . Hypertension Other   . Depression Other     Social History:  Social History   Social History  . Marital status: Divorced    Spouse name: N/A  . Number of children: 2  . Years of education: N/A   Occupational History  . disabled since 2006 due to ortho. heart, psych Unemployed  . part time work as an Multimedia programmer, wrestling, and baseball Agricultural consultant    Social History Main Topics  . Smoking status: Former Smoker    Types: Cigars    Quit date: 08/28/2010  . Smokeless tobacco: Never Used     Comment: 04/18/2016 "smoked 1 cigar/wk when I did smoke"  . Alcohol use No  Comment: rare (4 beers all summer)  None at all now  (03/2014)  . Drug use: No  . Sexual activity: Not Currently   Other Topics Concern  . None   Social History Narrative   Played semi-pro football, used steroids   Divorced moved here from [place], [place]   Patient states he has been on disability since his knee surgery.      03/05/2013 AHW "[name]" was born and grew up in [place], [place], and moved to [place], [place] at age 66. He has one sister and 3 brothers. He reports that his childhood was rough. His parents are deceased. He graduated from [employer], and attended one year of college. He is currently unemployed and on disability for multiple medical problems. He has been married 5 times. He has 2 sons. He currently lives alone. 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 He denies that he has any social support system. 03/05/2013 AHW             Allergies: No Known Allergies  Metabolic Disorder Labs: Lab Results  Component Value Date   HGBA1C 5.3 04/18/2016   MPG 105 04/18/2016   MPG 114 09/26/2013   No results found for: PROLACTIN Lab Results  Component Value Date   CHOL 109 11/10/2015   TRIG 81.0 11/10/2015   HDL 41.60 11/10/2015   CHOLHDL 3 11/10/2015   VLDL 16.2 11/10/2015   LDLCALC 51 11/10/2015   LDLCALC 51 03/12/2015     Current Medications: Current Outpatient Prescriptions  Medication Sig Dispense Refill  . AMITIZA 24 MCG capsule Take 24 mcg by mouth 2 (two) times daily with a meal.     . ARIPiprazole (ABILIFY) 5 MG tablet Take 1 tablet (5 mg total) by mouth daily. Take one tablet at bedtime 90 tablet 0  . aspirin 81 MG tablet Take 81 mg by mouth daily.    [name] atorvastatin (LIPITOR) 20 MG tablet TAKE 1 TABLET BY MOUTH EVERY DAY 90 tablet 3  . clonazePAM (KLONOPIN) 1 MG tablet TAKE 1 TABLET BY MOUTH AT BEDTIME AS NEEDED FOR ANXIETY 30 tablet 5  . cyclobenzaprine (FLEXERIL) 10 MG tablet Take 10 mg by mouth 3 (three) times daily as needed for muscle spasms.    [name]  FLUoxetine (PROZAC) 40 MG capsule TAKE 1 CAPSULE(40 MG) BY MOUTH DAILY 90 capsule 0  . LYRICA 100 MG capsule Take 1 capsule (100 mg total) by mouth 3 (three) times daily. 90 capsule 2  . metFORMIN (GLUCOPHAGE) 500 MG tablet Take 1 tablet (500 mg total) by mouth 2 (two) times daily with a meal. 180 tablet 3  . Omega-3 Fatty Acids (FISH OIL) 1000 MG CAPS Take 1,000 mg by mouth daily.     [name] oxyCODONE-acetaminophen (PERCOCET) 10-325 MG tablet Take 1 tablet by mouth every 4 (four) hours as needed for pain. 60 tablet 0  . prasugrel (EFFIENT) 5 MG TABS tablet Take 1 tablet (5 mg total) by mouth daily. 30 tablet 3  . tamsulosin (FLOMAX) 0.4 MG CAPS capsule Take 0.4 mg by mouth daily after breakfast.     . traZODone (DESYREL) 150 MG tablet Take 1 tablet (150 mg total) by mouth at bedtime. 90 tablet 0  . triamcinolone (NASACORT AQ) 55 MCG/ACT AERO nasal inhaler Place 2 sprays into the nose daily. 1 Inhaler 12  . benztropine (COGENTIN) 0.5 MG tablet Take 1 tablet (0.5 mg total) by mouth at bedtime. 90 tablet 0   No current facility-administered  medications for this visit.     Neurologic: Headache: No Seizure: No Paresthesias: Yes  Musculoskeletal: Strength & Muscle Tone: decreased Gait & Station: unsteady, Difficulty walking due to pain Patient leans: Front  Psychiatric Specialty Exam: Review of Systems  Constitutional: Positive for weight loss.  HENT: Negative.   Eyes: Negative.   Respiratory: Negative.   Cardiovascular: Negative.   Musculoskeletal: Positive for back pain and joint pain.  Skin: Negative.   Neurological: Positive for tingling.  Psychiatric/Behavioral: Positive for depression. The patient has insomnia.     Blood pressure 136/70, pulse 62, height 6' 1"  (1.854 m), weight 276 lb 12.8 oz (125.6 kg).Body mass index is 36.52 kg/m.  General Appearance: Casual and Fairly Groomed  Eye Contact:  Fair  Speech:  Clear and Coherent  Volume:  Decreased  Mood:  Anxious and Depressed   Affect:  Constricted  Thought Process:  Goal Directed  Orientation:  Full (Time, Place, and Person)  Thought Content: Rumination   Suicidal Thoughts:  No  Homicidal Thoughts:  No  Memory:  Immediate;   Good Recent;   Fair Remote;   Fair  Judgement:  Fair  Insight:  Fair  Psychomotor Activity:  Decreased and Tremor  Concentration:  Concentration: Fair and Attention Span: Fair  Recall:  [employer] of Knowledge: Good  Language: Good  Akathisia:  Mild tremors in his right hand  Handed:  Right  AIMS (if indicated):  0  Assets:  Communication Skills Desire for Improvement Housing Resilience  ADL's:  Intact  Cognition: WNL  Sleep:  4 hours    Assessment: Bipolar disorder type I.  Plan: I review his symptoms, current medication, collateral information from recent hospital stay and discharge summary.  Patient has mild tremors in his hand.  I will add Cogentin 0.5 mg at bedtime to help these tremors.  I will also increase trazodone 150 mg to help his insomnia.  Patient does not want to change his Abilify and Prozac.  He is also taking Lyrica, Klonopin and narcotic pain medication from other providers.  Discussed medication side effects and benefits.  I offer counseling but due to financial reasons patient is unable to afford the counseling at this time.  Discuss safety plan that anytime having active suicidal thoughts or homicidal thoughts and he need to call 911 or go to local emergency room.  Follow-up in 3 months.   Draven Laine T., MD 06/13/2016, 2:34 PM

## 2016-06-13 NOTE — Progress Notes (Signed)
Office Visit Note   Patient: Travis Prewitt Sr.           Date of Birth: 02-22-1951           MRN: [id number] Visit Date: 06/13/2016              Requested by: [name], MD [place], [place] [address] PCP: [name], MD  Chief Complaint  Patient presents with  . Right Ankle - Routine Post Op    05/02/16 right ankle fusion s/p mechanical breakdown of internal fixation.      HPI: Patient is non weight bearing in a wheelchair and fracture boot. He is s/p a right ankle fusion s/p mechanical breakdown of internal fixation on 05/07/16.  He comes in today stating that he has had to put weight on his foot because of the walker that he is having to use and that he tried to wear a fracture boot to help keep the ankle stabilized. The medial side of the ankle has an open area the pt states from rubbing in his boot. There is a small amount of bloody drainage and non viable tissue at the center of the ulcer. The pt is using a Vive compression sock for the swelling. The foot and ankle are noticeable in more valgus alignment than last visit. Pamella Pert, RMA    Assessment & Plan: Visit Diagnoses:  1. Pain in right ankle and joints of right foot     Plan: Discussed with the patient a Charcot arthropathy he has lost some of the reduction and intervention at this time to maintain foot salvage due to the ulcer over the medial malleolus would require surgical intervention for excision of the medial malleolus and placement of a wound VAC. Discussed the long-term he would still have further breakdown due to potential further Charcot arthropathy changes. Discussed that a transtibial amputation would be a good stable option to minimize his risks of surgeries in the future and improve his ambulatory function. Patient states that he would like to proceed with a transtibial amputation. We will set this up for tomorrow morning. Patient states that he did like to go to [facility]  skilled nursing postoperatively.  Follow-Up Instructions: Return in about 2 weeks (around 06/27/2016).   Ortho Exam Examination patient has progressive ulceration of the medial malleolus with a large necrotic ulcer which is 3 cm in diameter. Radiographs shows no destructive bony changes but he has increased valgus alignment to the tibial calcaneal fusion with a prominent medial malleolus and his foot is not plantar grade.  Imaging: Xr Ankle Complete Right  Result Date: 06/13/2016 Two-view radiographs of the right ankle shows loss of reduction of the internal fixation secondary to the Charcot arthropathy. Patient has a more prominent medial malleolus.   Orders:  Orders Placed This Encounter  Procedures  . XR Ankle Complete Right   No orders of the defined types were placed in this encounter.    Procedures: No procedures performed  Clinical Data: No additional findings.  Subjective: Review of Systems  Objective: Vital Signs: Ht 6' 1"  (1.854 m)   Wt 276 lb (125.2 kg)   BMI 36.41 kg/m   Specialty Comments:  No specialty comments available.  PMFS History: Patient Active Problem List   Diagnosis Date Noted  . S/P ankle fusion 04/18/2016  . Charcot's arthropathy associated with type 2 diabetes mellitus ([place]) 04/11/2016  . Mechanical breakdown of internal fixation device of bone of right  lower leg ([place]) 04/11/2016  . Encounter for well adult exam with abnormal findings 11/05/2015  . Major depression 09/13/2015  . S/P TKR (total knee replacement) bilaterally 09/13/2015  . GERD (gastroesophageal reflux disease) 09/08/2015  . Open ankle fracture 09/02/2015  . Fracture dislocation of ankle joint 09/02/2015  . S/P laparoscopic hernia repair 05/11/2015  . PPD positive 04/08/2015  . Benign neoplasm of descending colon   . Benign neoplasm of cecum   . AKI (acute kidney injury) ([place]) 10/18/2014  . Acute blood loss anemia   . Chronic anticoagulation   . Closed fracture of right  foot 10/17/2014  . Occult blood positive stool 10/17/2014  . General weakness 07/14/2014  . Urinary incontinence 07/14/2014  . Arthritis of foot, right, degenerative 04/15/2014  . Headache(784.0) 10/15/2013  . Spinal stenosis in cervical region 09/26/2013    Class: Chronic  . Spinal stenosis, lumbar region, with neurogenic claudication 09/26/2013    Class: Chronic  . Hand joint pain 06/10/2013  . Rotator cuff tear arthropathy of both shoulders 06/10/2013  . Skin lesion of cheek 05/01/2013  . Pain of right thumb 04/03/2013  . Balance disorder 03/12/2013  . Gait disorder 03/12/2013  . Tremor 03/12/2013  . Left hip pain 03/12/2013  . Pre-ulcerative corn or callous 02/06/2013  . Anxiety 11/12/2011  . OSA (obstructive sleep apnea) 11/07/2011  . Bradycardia 10/20/2011  . Insomnia 10/04/2011  . Obesity 01/12/2011  . ERECTILE DYSFUNCTION, ORGANIC 05/30/2010  . [place] DISEASE, LUMBAR 04/19/2010  . SCIATICA, LEFT 04/19/2010  . Chronic pain syndrome 10/27/2009  . Hyperlipidemia 07/15/2009  . Essential hypertension 06/24/2009  . CAD (coronary artery disease) 06/24/2009  . Allergic rhinitis 06/24/2009  . URETHRAL STRICTURE 06/24/2009  . DEGENERATIVE JOINT DISEASE 06/24/2009  . SHOULDER PAIN, BILATERAL 06/24/2009  . FATIGUE 06/24/2009  . NEPHROLITHIASIS, HX OF 06/24/2009   Past Medical History:  Diagnosis Date  . ALLERGIC RHINITIS 06/24/2009  . Allergic rhinitis 06/24/2009   Qualifier: Diagnosis of  By: [name], [name]   . Anxiety 11/12/2011   Adequate for discharge   . Arthritis    "all my joints" (09/30/2013)  . Arthritis of foot, right, degenerative 04/15/2014  . Balance disorder 03/12/2013  . Benign neoplasm of cecum   . Benign neoplasm of descending colon   . CAD (coronary artery disease) 06/24/2009   5 stents placed in 2007    . Chronic anticoagulation   . Chronic pain syndrome 10/27/2009   of ankle, shoulders, low back.  sciatica.   . Closed fracture of right foot 10/17/2014  .  CORONARY ARTERY DISEASE 06/24/2009   a. s/p multiple PCIs - In 2008 he had a Taxus DES to the mild LAD, Endeavor DES to mid LCX and distal LCX. In January 2009 he had DES to distal LCX, mid LCX and proximal LCX. In November 2009 had BMS x 2 to the mid RCA. Cath 10/2011 with patent stents, noncardiac CP. LHC 01/2013: patent stents (noncardiac CP).  . DEGENERATIVE JOINT DISEASE 06/24/2009   Qualifier: Diagnosis of  By: [name], [name]   . Depression   . Depression with anxiety    Prior suicide attempt  . [place] DISEASE, LUMBAR 04/19/2010  . ERECTILE DYSFUNCTION, ORGANIC 05/30/2010  . Essential hypertension 06/24/2009   Qualifier: Diagnosis of  By: [name], [name]   . Fibromyalgia   . Fracture dislocation of ankle joint 09/02/2015  . Gait disorder 03/12/2013  . General weakness 07/14/2014  . GERD (gastroesophageal reflux disease) 09/08/2015  .  Hand joint pain 06/10/2013  . Heart murmur   . Hepatitis C   . History of kidney stones   . Hyperlipidemia 07/15/2009   Qualifier: Diagnosis of  By: Aundra Dubin, MD, [place]    . HYPERLIPIDEMIA-MIXED 07/15/2009  . HYPERTENSION 06/24/2009  . Insomnia 10/04/2011  . Irregular heart beat   . Left hip pain 03/12/2013   Injected under ultrasound guidance on June 24, 2013   . Major depression 09/13/2015  . Myocardial infarction 2008  . Non-cardiac chest pain 10/2011, 01/2013  . Obesity   . Occult blood positive stool 10/17/2014  . Open ankle fracture 09/02/2015  . OSA (obstructive sleep apnea)    not using CPAP (09/30/2013)  . Pain of right thumb 04/03/2013  . Pneumonia   . PPD positive 04/08/2015  . Pre-ulcerative corn or callous 02/06/2013  . Restless legs   . Rotator cuff tear arthropathy of both shoulders 06/10/2013   History of bilateral shoulder cuff surgery for rotator cuff tears. Reports increase in pain 09/11/2015 during physical therapy of the left shoulder.   [name] SCIATICA, LEFT 04/19/2010   Qualifier: Diagnosis of  By: [name], [name]   . Spinal stenosis in  cervical region 09/26/2013  . Spinal stenosis, lumbar region, with neurogenic claudication 09/26/2013  . Type II diabetes mellitus ([place]) 2012   no meds in 09/2014.   [name] Uncontrolled type 2 DM with peripheral circulatory disorder ([place]) 10/04/2013  . URETHRAL STRICTURE 06/24/2009   self catheterizes.     Family History  Problem Relation Age of Onset  . Depression Mother   . Heart disease Mother   . Hypertension Mother   . Cancer Mother     Breast  . Diabetes Father   . Heart disease Father     CABG  . Cancer Father     prostate and skin cancer  . Hypertension Father   . Hyperlipidemia Father   . Depression Brother     x 2  . Hypertension Brother     x2  . Heart disease Maternal Grandfather   . Early death Maternal Grandfather 84    heart attack  . Early death Paternal Grandfather   . Coronary artery disease Other   . Hypertension Other   . Depression Other     Past Surgical History:  Procedure Laterality Date  . ANKLE FUSION Right 04/15/2014   Procedure: Right Subtalar, Talonavicular Fusion;  Surgeon: Newt Minion, MD;  Location: [facility];  Service: Orthopedics;  Laterality: Right;  . ANKLE FUSION Right 04/18/2016   Right Ankle Tibiocalcaneal Fusion; Removal of deep retained hardware; application of wound vac/notes 04/18/2016  . ANKLE FUSION Right 04/18/2016   Procedure: Right Ankle Tibiocalcaneal Fusion;  Surgeon: Newt Minion, MD;  Location: [facility];  Service: Orthopedics;  Laterality: Right;  . ANTERIOR CERVICAL DECOMP/DISCECTOMY FUSION N/A 09/26/2013   Procedure: ANTERIOR CERVICAL DISCECTOMY FUSION C3-4, plate and screw fixation, allograft bone graft;  Surgeon: Jessy Oto, MD;  Location: [facility];  Service: Orthopedics;  Laterality: N/A;  . BACK SURGERY    . CARDIAC CATHETERIZATION  X 1  . CARPAL TUNNEL RELEASE Bilateral   . COLONOSCOPY    . COLONOSCOPY N/A 10/22/2014   Procedure: COLONOSCOPY;  Surgeon: Lafayette Dragon, MD;  Location: [facility] ENDOSCOPY;  Service: Endoscopy;  Laterality:  N/A;  . CORONARY ANGIOPLASTY WITH STENT PLACEMENT     "I have 9 stents"  . ESOPHAGOGASTRODUODENOSCOPY N/A 10/19/2014   Procedure: ESOPHAGOGASTRODUODENOSCOPY (EGD);  Surgeon: Jerene Bears,  MD;  Location: [facility];  Service: Endoscopy;  Laterality: N/A;  . FUSION OF TALONAVICULAR JOINT Right 04/15/2014   dr Caelan Atchley  . HERNIA REPAIR     umbilical  . INGUINAL HERNIA REPAIR Right 05/11/2015   Procedure: LAPAROSCOPIC REPAIR RIGHT  INGUINAL HERNIA;  Surgeon: Greer Pickerel, MD;  Location: [facility];  Service: General;  Laterality: Right;  . INSERTION OF MESH Right 05/11/2015   Procedure: INSERTION OF MESH;  Surgeon: Greer Pickerel, MD;  Location: [facility];  Service: General;  Laterality: Right;  . JOINT REPLACEMENT Bilateral    knees  . KNEE CARTILAGE SURGERY Right X 12   "~ 1/2 open; ~ 1/2 scopes"  . KNEE CARTILAGE SURGERY Left X 3   "3 scopes"  . LEFT HEART CATHETERIZATION WITH CORONARY ANGIOGRAM N/A 02/10/2013   Procedure: LEFT HEART CATHETERIZATION WITH CORONARY ANGIOGRAM;  Surgeon: Burnell Blanks, MD;  Location: [facility] CATH LAB;  Service: Cardiovascular;  Laterality: N/A;  . LUMBAR LAMINECTOMY/DECOMPRESSION MICRODISCECTOMY N/A 01/27/2014   Procedure: CENTRAL LUMBAR LAMINECTOMY L4-5 AND L3-4;  Surgeon: Jessy Oto, MD;  Location: [facility];  Service: Orthopedics;  Laterality: N/A;  . ORIF ANKLE FRACTURE Right 09/02/2015   Procedure: OPEN REDUCTION INTERNAL FIXATION (ORIF) ANKLE FRACTURE;  Surgeon: Newt Minion, MD;  Location: [facility];  Service: Orthopedics;  Laterality: Right;  . PERIPHERALLY INSERTED CENTRAL CATHETER INSERTION  09/02/2015  . SHOULDER ARTHROSCOPY W/ ROTATOR CUFF REPAIR Bilateral    "3 on the right; 1 on the left"  . SKIN SPLIT GRAFT Right 10/01/2015   Procedure: RIGHT ANKLE APPLY SKIN GRAFT SPLIT THICKNESS;  Surgeon: Newt Minion, MD;  Location: [facility];  Service: Orthopedics;  Laterality: Right;  . TONSILLECTOMY    . TOTAL KNEE ARTHROPLASTY Bilateral 2008  . UMBILICAL HERNIA REPAIR      UHR  . URETHRAL DILATION  X 4  . VASECTOMY     Social History   Occupational History  . disabled since 2006 due to ortho. heart, psych Unemployed  . part time work as an Multimedia programmer, wrestling, and baseball Agricultural consultant    Social History Main Topics  . Smoking status: Former Smoker    Types: Cigars    Quit date: 08/28/2010  . Smokeless tobacco: Never Used     Comment: 04/18/2016 "smoked 1 cigar/wk when I did smoke"  . Alcohol use No     Comment: rare (4 beers all summer)  None at all now (03/2014)  . Drug use: No  . Sexual activity: Not Currently

## 2016-06-14 ENCOUNTER — Inpatient Hospital Stay (HOSPITAL_COMMUNITY): Payer: Medicare PPO | Admitting: Anesthesiology

## 2016-06-14 ENCOUNTER — Inpatient Hospital Stay (HOSPITAL_COMMUNITY)
Admission: RE | Admit: 2016-06-14 | Discharge: 2016-06-18 | DRG: 041 | Disposition: A | Discharge status: Skilled Nursing Facility | Payer: Medicare PPO | Source: Ambulatory Visit | Attending: Orthopedic Surgery | Admitting: Orthopedic Surgery

## 2016-06-14 ENCOUNTER — Encounter (HOSPITAL_COMMUNITY)
Admission: RE | Disposition: A | Discharge status: Skilled Nursing Facility | Payer: Self-pay | Source: Ambulatory Visit | Attending: Orthopedic Surgery

## 2016-06-14 ENCOUNTER — Encounter (HOSPITAL_COMMUNITY): Payer: Self-pay | Admitting: *Deleted

## 2016-06-14 DIAGNOSIS — E1140 Type 2 diabetes mellitus with diabetic neuropathy, unspecified: Secondary | ICD-10-CM | POA: Diagnosis present

## 2016-06-14 DIAGNOSIS — L97319 Non-pressure chronic ulcer of right ankle with unspecified severity: Secondary | ICD-10-CM | POA: Diagnosis present

## 2016-06-14 DIAGNOSIS — M797 Fibromyalgia: Secondary | ICD-10-CM | POA: Diagnosis present

## 2016-06-14 DIAGNOSIS — N529 Male erectile dysfunction, unspecified: Secondary | ICD-10-CM | POA: Diagnosis present

## 2016-06-14 DIAGNOSIS — Z87891 Personal history of nicotine dependence: Secondary | ICD-10-CM | POA: Diagnosis not present

## 2016-06-14 DIAGNOSIS — M1990 Unspecified osteoarthritis, unspecified site: Secondary | ICD-10-CM | POA: Diagnosis present

## 2016-06-14 DIAGNOSIS — Z833 Family history of diabetes mellitus: Secondary | ICD-10-CM

## 2016-06-14 DIAGNOSIS — Z96653 Presence of artificial knee joint, bilateral: Secondary | ICD-10-CM | POA: Diagnosis present

## 2016-06-14 DIAGNOSIS — Z87442 Personal history of urinary calculi: Secondary | ICD-10-CM | POA: Diagnosis not present

## 2016-06-14 DIAGNOSIS — Z8249 Family history of ischemic heart disease and other diseases of the circulatory system: Secondary | ICD-10-CM

## 2016-06-14 DIAGNOSIS — B1920 Unspecified viral hepatitis C without hepatic coma: Secondary | ICD-10-CM | POA: Diagnosis present

## 2016-06-14 DIAGNOSIS — E1151 Type 2 diabetes mellitus with diabetic peripheral angiopathy without gangrene: Secondary | ICD-10-CM | POA: Diagnosis present

## 2016-06-14 DIAGNOSIS — G546 Phantom limb syndrome with pain: Secondary | ICD-10-CM | POA: Diagnosis not present

## 2016-06-14 DIAGNOSIS — E11610 Type 2 diabetes mellitus with diabetic neuropathic arthropathy: Secondary | ICD-10-CM | POA: Diagnosis not present

## 2016-06-14 DIAGNOSIS — E785 Hyperlipidemia, unspecified: Secondary | ICD-10-CM | POA: Diagnosis present

## 2016-06-14 DIAGNOSIS — Z915 Personal history of self-harm: Secondary | ICD-10-CM

## 2016-06-14 DIAGNOSIS — I10 Essential (primary) hypertension: Secondary | ICD-10-CM | POA: Diagnosis present

## 2016-06-14 DIAGNOSIS — Z89511 Acquired absence of right leg below knee: Secondary | ICD-10-CM

## 2016-06-14 DIAGNOSIS — I252 Old myocardial infarction: Secondary | ICD-10-CM

## 2016-06-14 DIAGNOSIS — M21071 Valgus deformity, not elsewhere classified, right ankle: Secondary | ICD-10-CM | POA: Diagnosis present

## 2016-06-14 DIAGNOSIS — E11622 Type 2 diabetes mellitus with other skin ulcer: Secondary | ICD-10-CM | POA: Diagnosis present

## 2016-06-14 DIAGNOSIS — K219 Gastro-esophageal reflux disease without esophagitis: Secondary | ICD-10-CM | POA: Diagnosis present

## 2016-06-14 DIAGNOSIS — F418 Other specified anxiety disorders: Secondary | ICD-10-CM | POA: Diagnosis present

## 2016-06-14 DIAGNOSIS — I2510 Atherosclerotic heart disease of native coronary artery without angina pectoris: Secondary | ICD-10-CM | POA: Diagnosis present

## 2016-06-14 DIAGNOSIS — G894 Chronic pain syndrome: Secondary | ICD-10-CM | POA: Diagnosis present

## 2016-06-14 DIAGNOSIS — Z7901 Long term (current) use of anticoagulants: Secondary | ICD-10-CM

## 2016-06-14 DIAGNOSIS — G4733 Obstructive sleep apnea (adult) (pediatric): Secondary | ICD-10-CM | POA: Diagnosis present

## 2016-06-14 DIAGNOSIS — G2581 Restless legs syndrome: Secondary | ICD-10-CM | POA: Diagnosis present

## 2016-06-14 HISTORY — PX: AMPUTATION: SHX166

## 2016-06-14 HISTORY — PX: BELOW KNEE LEG AMPUTATION: SUR23

## 2016-06-14 LAB — COMPREHENSIVE METABOLIC PANEL
ALK PHOS: 105 U/L (ref 38–126)
ALT: 13 U/L — ABNORMAL LOW (ref 17–63)
ANION GAP: 8 (ref 5–15)
AST: 19 U/L (ref 15–41)
Albumin: 3.6 g/dL (ref 3.5–5.0)
BILIRUBIN TOTAL: 0.7 mg/dL (ref 0.3–1.2)
BUN: 14 mg/dL (ref 6–20)
CALCIUM: 8.9 mg/dL (ref 8.9–10.3)
CO2: 21 mmol/L — ABNORMAL LOW (ref 22–32)
Chloride: 111 mmol/L (ref 101–111)
Creatinine, Ser: 0.85 mg/dL (ref 0.61–1.24)
GFR calc Af Amer: >60 mL/min (ref >60)
GFR calc non Af Amer: >60 mL/min (ref >60)
Glucose, Bld: 101 mg/dL — ABNORMAL HIGH (ref 65–99)
Potassium: 3.8 mmol/L (ref 3.5–5.1)
Sodium: 140 mmol/L (ref 135–145)
Total Protein: 7.1 g/dL (ref 6.5–8.1)

## 2016-06-14 LAB — CBC
HEMATOCRIT: 34.8 % — AB (ref 39.0–52.0)
HEMOGLOBIN: 11.2 g/dL — AB (ref 13.0–17.0)
MCH: 27.7 pg (ref 26.0–34.0)
MCHC: 32.2 g/dL (ref 30.0–36.0)
MCV: 86.1 fL (ref 78.0–100.0)
Platelets: 250 10*3/uL (ref 150–400)
RBC: 4.04 MIL/uL — ABNORMAL LOW (ref 4.22–5.81)
RDW: 14.6 % (ref 11.5–15.5)
WBC: 5.6 10*3/uL (ref 4.0–10.5)

## 2016-06-14 LAB — GLUCOSE, CAPILLARY
GLUCOSE-CAPILLARY: 102 mg/dL — AB (ref 65–99)
GLUCOSE-CAPILLARY: 207 mg/dL — AB (ref 65–99)
Glucose-Capillary: 119 mg/dL — ABNORMAL HIGH (ref 65–99)
Glucose-Capillary: 129 mg/dL — ABNORMAL HIGH (ref 65–99)

## 2016-06-14 LAB — PROTIME-INR
INR: 1.17
Prothrombin Time: 15 seconds (ref 11.4–15.2)

## 2016-06-14 SURGERY — AMPUTATION BELOW KNEE
Anesthesia: General | Site: Knee | Laterality: Right

## 2016-06-14 MED ORDER — TRIAMCINOLONE ACETONIDE 55 MCG/ACT NA AERO
2.0000 | INHALATION_SPRAY | Freq: Every day | NASAL | Status: DC
  Administered 2016-06-15 – 2016-06-18 (×4): 2 via NASAL
  Filled 2016-06-14: qty 21.6

## 2016-06-14 MED ORDER — HYDROMORPHONE HCL 1 MG/ML IJ SOLN
INTRAMUSCULAR | Status: AC
  Filled 2016-06-14: qty 0.5

## 2016-06-14 MED ORDER — ONDANSETRON HCL 4 MG/2ML IJ SOLN: 4.0000 mg | Freq: Four times a day (QID) | INTRAMUSCULAR | Status: DC | PRN

## 2016-06-14 MED ORDER — KETAMINE HCL 10 MG/ML IJ SOLN
INTRAMUSCULAR | Status: DC | PRN
  Administered 2016-06-14: 60 mg via INTRAVENOUS
  Administered 2016-06-14: 20 mg via INTRAVENOUS

## 2016-06-14 MED ORDER — METHOCARBAMOL 500 MG PO TABS
500.0000 mg | ORAL_TABLET | Freq: Four times a day (QID) | ORAL | Status: DC | PRN
  Administered 2016-06-14 – 2016-06-18 (×14): 500 mg via ORAL
  Filled 2016-06-14 (×13): qty 1

## 2016-06-14 MED ORDER — TRAZODONE HCL 50 MG PO TABS
150.0000 mg | ORAL_TABLET | Freq: Every day | ORAL | Status: DC
  Administered 2016-06-14 – 2016-06-17 (×4): 150 mg via ORAL
  Filled 2016-06-14 (×4): qty 1

## 2016-06-14 MED ORDER — LUBIPROSTONE 24 MCG PO CAPS
24.0000 ug | ORAL_CAPSULE | Freq: Two times a day (BID) | ORAL | Status: DC
  Administered 2016-06-15 – 2016-06-18 (×7): 24 ug via ORAL
  Filled 2016-06-14 (×10): qty 1

## 2016-06-14 MED ORDER — MIDAZOLAM HCL 5 MG/5ML IJ SOLN
INTRAMUSCULAR | Status: DC | PRN
  Administered 2016-06-14: 2 mg via INTRAVENOUS

## 2016-06-14 MED ORDER — METOCLOPRAMIDE HCL 5 MG PO TABS: 5.0000 mg | ORAL_TABLET | Freq: Three times a day (TID) | ORAL | Status: DC | PRN

## 2016-06-14 MED ORDER — ATORVASTATIN CALCIUM 20 MG PO TABS
20.0000 mg | ORAL_TABLET | Freq: Every day | ORAL | Status: DC
  Administered 2016-06-14 – 2016-06-17 (×4): 20 mg via ORAL
  Filled 2016-06-14 (×4): qty 1

## 2016-06-14 MED ORDER — DOCUSATE SODIUM 100 MG PO CAPS
100.0000 mg | ORAL_CAPSULE | Freq: Two times a day (BID) | ORAL | Status: DC
  Administered 2016-06-14 – 2016-06-18 (×7): 100 mg via ORAL
  Filled 2016-06-14 (×8): qty 1

## 2016-06-14 MED ORDER — TAMSULOSIN HCL 0.4 MG PO CAPS
0.4000 mg | ORAL_CAPSULE | Freq: Every day | ORAL | Status: DC
  Administered 2016-06-15 – 2016-06-18 (×4): 0.4 mg via ORAL
  Filled 2016-06-14 (×4): qty 1

## 2016-06-14 MED ORDER — PROPOFOL 10 MG/ML IV BOLUS
INTRAVENOUS | Status: AC
  Filled 2016-06-14: qty 20

## 2016-06-14 MED ORDER — DEXTROSE 5 % IV SOLN
500.0000 mg | Freq: Four times a day (QID) | INTRAVENOUS | Status: DC | PRN
  Filled 2016-06-14: qty 5

## 2016-06-14 MED ORDER — METOCLOPRAMIDE HCL 5 MG/ML IJ SOLN: 5.0000 mg | Freq: Three times a day (TID) | INTRAMUSCULAR | Status: DC | PRN

## 2016-06-14 MED ORDER — FENTANYL CITRATE (PF) 100 MCG/2ML IJ SOLN
INTRAMUSCULAR | Status: DC | PRN
  Administered 2016-06-14: 50 ug via INTRAVENOUS
  Administered 2016-06-14: 100 ug via INTRAVENOUS
  Administered 2016-06-14: 50 ug via INTRAVENOUS

## 2016-06-14 MED ORDER — SODIUM CHLORIDE 0.9 % IV SOLN
INTRAVENOUS | Status: DC
  Administered 2016-06-14: 14:00:00 via INTRAVENOUS

## 2016-06-14 MED ORDER — ACETAMINOPHEN 325 MG PO TABS
650.0000 mg | ORAL_TABLET | Freq: Four times a day (QID) | ORAL | Status: DC | PRN
  Administered 2016-06-17 – 2016-06-18 (×2): 650 mg via ORAL
  Filled 2016-06-14 (×2): qty 2

## 2016-06-14 MED ORDER — ASPIRIN 81 MG PO CHEW
81.0000 mg | CHEWABLE_TABLET | Freq: Every day | ORAL | Status: DC
  Administered 2016-06-14 – 2016-06-18 (×5): 81 mg via ORAL
  Filled 2016-06-14 (×5): qty 1

## 2016-06-14 MED ORDER — METFORMIN HCL 500 MG PO TABS
500.0000 mg | ORAL_TABLET | Freq: Two times a day (BID) | ORAL | Status: DC
  Administered 2016-06-14 – 2016-06-18 (×8): 500 mg via ORAL
  Filled 2016-06-14 (×8): qty 1

## 2016-06-14 MED ORDER — MIDAZOLAM HCL 2 MG/2ML IJ SOLN
INTRAMUSCULAR | Status: AC
  Filled 2016-06-14: qty 2

## 2016-06-14 MED ORDER — FENTANYL CITRATE (PF) 100 MCG/2ML IJ SOLN
INTRAMUSCULAR | Status: AC
  Filled 2016-06-14: qty 2

## 2016-06-14 MED ORDER — METHOCARBAMOL 500 MG PO TABS
ORAL_TABLET | ORAL | Status: AC
  Filled 2016-06-14: qty 1

## 2016-06-14 MED ORDER — ARIPIPRAZOLE 5 MG PO TABS
5.0000 mg | ORAL_TABLET | Freq: Every day | ORAL | Status: DC
  Administered 2016-06-14 – 2016-06-17 (×4): 5 mg via ORAL
  Filled 2016-06-14 (×4): qty 1

## 2016-06-14 MED ORDER — ACETAMINOPHEN 650 MG RE SUPP: 650.0000 mg | Freq: Four times a day (QID) | RECTAL | Status: DC | PRN

## 2016-06-14 MED ORDER — BISACODYL 10 MG RE SUPP: 10.0000 mg | Freq: Every day | RECTAL | Status: DC | PRN

## 2016-06-14 MED ORDER — KETAMINE HCL-SODIUM CHLORIDE 100-0.9 MG/10ML-% IV SOSY
PREFILLED_SYRINGE | INTRAVENOUS | Status: AC
  Filled 2016-06-14: qty 10

## 2016-06-14 MED ORDER — KETOROLAC TROMETHAMINE 15 MG/ML IJ SOLN
INTRAMUSCULAR | Status: AC
  Filled 2016-06-14: qty 1

## 2016-06-14 MED ORDER — KETOROLAC TROMETHAMINE 15 MG/ML IJ SOLN
15.0000 mg | Freq: Four times a day (QID) | INTRAMUSCULAR | Status: AC
  Administered 2016-06-14 – 2016-06-15 (×4): 15 mg via INTRAVENOUS
  Filled 2016-06-14 (×3): qty 1

## 2016-06-14 MED ORDER — OXYCODONE HCL 5 MG PO TABS
5.0000 mg | ORAL_TABLET | ORAL | Status: DC | PRN
  Administered 2016-06-14 – 2016-06-18 (×24): 10 mg via ORAL
  Filled 2016-06-14 (×24): qty 2

## 2016-06-14 MED ORDER — MIRABEGRON ER 25 MG PO TB24
25.0000 mg | ORAL_TABLET | Freq: Every day | ORAL | Status: DC
  Administered 2016-06-14 – 2016-06-18 (×5): 25 mg via ORAL
  Filled 2016-06-14 (×5): qty 1

## 2016-06-14 MED ORDER — ASPIRIN 81 MG PO TABS
81.0000 mg | ORAL_TABLET | Freq: Every day | ORAL | Status: DC
  Filled 2016-06-14 (×2): qty 1

## 2016-06-14 MED ORDER — GABAPENTIN 300 MG PO CAPS
300.0000 mg | ORAL_CAPSULE | Freq: Three times a day (TID) | ORAL | Status: DC
  Administered 2016-06-14 – 2016-06-18 (×12): 300 mg via ORAL
  Filled 2016-06-14 (×13): qty 1

## 2016-06-14 MED ORDER — LACTATED RINGERS IV SOLN
INTRAVENOUS | Status: DC
  Administered 2016-06-14 (×2): via INTRAVENOUS

## 2016-06-14 MED ORDER — ONDANSETRON HCL 4 MG/2ML IJ SOLN
INTRAMUSCULAR | Status: DC | PRN
  Administered 2016-06-14: 4 mg via INTRAVENOUS

## 2016-06-14 MED ORDER — FLUOXETINE HCL 20 MG PO CAPS
40.0000 mg | ORAL_CAPSULE | Freq: Every day | ORAL | Status: DC
  Administered 2016-06-14 – 2016-06-18 (×5): 40 mg via ORAL
  Filled 2016-06-14 (×2): qty 2
  Filled 2016-06-14 (×2): qty 1
  Filled 2016-06-14 (×2): qty 2

## 2016-06-14 MED ORDER — HYDROMORPHONE HCL 2 MG/ML IJ SOLN
1.0000 mg | INTRAMUSCULAR | Status: DC | PRN
  Administered 2016-06-14 – 2016-06-15 (×5): 1 mg via INTRAVENOUS
  Filled 2016-06-14 (×5): qty 1

## 2016-06-14 MED ORDER — FENTANYL CITRATE (PF) 100 MCG/2ML IJ SOLN
100.0000 ug | Freq: Once | INTRAMUSCULAR | Status: AC
  Administered 2016-06-14: 100 ug via INTRAVENOUS

## 2016-06-14 MED ORDER — DEXAMETHASONE SODIUM PHOSPHATE 10 MG/ML IJ SOLN
INTRAMUSCULAR | Status: DC | PRN
  Administered 2016-06-14: 10 mg via INTRAVENOUS

## 2016-06-14 MED ORDER — 0.9 % SODIUM CHLORIDE (POUR BTL) OPTIME
TOPICAL | Status: DC | PRN
Start: 2016-06-14 — End: 2016-06-14
  Administered 2016-06-14: 1000 mL

## 2016-06-14 MED ORDER — OMEGA-3-ACID ETHYL ESTERS 1 G PO CAPS
1.0000 g | ORAL_CAPSULE | Freq: Every day | ORAL | Status: DC
  Administered 2016-06-14 – 2016-06-18 (×5): 1 g via ORAL
  Filled 2016-06-14 (×5): qty 1

## 2016-06-14 MED ORDER — MAGNESIUM CITRATE PO SOLN: 1.0000 | Freq: Once | ORAL | Status: DC | PRN

## 2016-06-14 MED ORDER — ONDANSETRON HCL 4 MG PO TABS: 4.0000 mg | ORAL_TABLET | Freq: Four times a day (QID) | ORAL | Status: DC | PRN

## 2016-06-14 MED ORDER — LIDOCAINE HCL (CARDIAC) 20 MG/ML IV SOLN
INTRAVENOUS | Status: DC | PRN
  Administered 2016-06-14: 60 mg via INTRAVENOUS

## 2016-06-14 MED ORDER — POLYETHYLENE GLYCOL 3350 17 G PO PACK
17.0000 g | PACK | Freq: Every day | ORAL | Status: DC | PRN
  Administered 2016-06-18: 17 g via ORAL
  Filled 2016-06-14: qty 1

## 2016-06-14 MED ORDER — FENTANYL CITRATE (PF) 100 MCG/2ML IJ SOLN
INTRAMUSCULAR | Status: AC
  Filled 2016-06-14: qty 4

## 2016-06-14 MED ORDER — CEFAZOLIN SODIUM-DEXTROSE 2-4 GM/100ML-% IV SOLN
2.0000 g | INTRAVENOUS | Status: AC
  Administered 2016-06-14: 2 g via INTRAVENOUS
  Filled 2016-06-14: qty 100

## 2016-06-14 MED ORDER — PROPOFOL 10 MG/ML IV BOLUS
INTRAVENOUS | Status: DC | PRN
  Administered 2016-06-14: 150 mg via INTRAVENOUS

## 2016-06-14 MED ORDER — PRASUGREL HCL 10 MG PO TABS
5.0000 mg | ORAL_TABLET | Freq: Every day | ORAL | Status: DC
  Administered 2016-06-14 – 2016-06-18 (×5): 5 mg via ORAL
  Filled 2016-06-14 (×5): qty 1

## 2016-06-14 MED ORDER — CEFAZOLIN SODIUM-DEXTROSE 2-4 GM/100ML-% IV SOLN
2.0000 g | Freq: Four times a day (QID) | INTRAVENOUS | Status: AC
  Administered 2016-06-14 – 2016-06-15 (×3): 2 g via INTRAVENOUS
  Filled 2016-06-14 (×4): qty 100

## 2016-06-14 MED ORDER — TRANEXAMIC ACID 1000 MG/10ML IV SOLN
2000.0000 mg | INTRAVENOUS | Status: AC
  Administered 2016-06-14: 2000 mg via TOPICAL
  Filled 2016-06-14: qty 20

## 2016-06-14 MED ORDER — CLONAZEPAM 1 MG PO TABS
1.0000 mg | ORAL_TABLET | Freq: Every evening | ORAL | Status: DC | PRN
  Administered 2016-06-14 – 2016-06-17 (×4): 1 mg via ORAL
  Filled 2016-06-14 (×4): qty 1

## 2016-06-14 MED ORDER — OXYCODONE HCL 5 MG PO TABS
ORAL_TABLET | ORAL | Status: AC
  Filled 2016-06-14: qty 2

## 2016-06-14 MED ORDER — INSULIN ASPART 100 UNIT/ML ~~LOC~~ SOLN
0.0000 [IU] | Freq: Three times a day (TID) | SUBCUTANEOUS | Status: DC
  Administered 2016-06-14: 5 [IU] via SUBCUTANEOUS
  Administered 2016-06-15: 2 [IU] via SUBCUTANEOUS

## 2016-06-14 MED ORDER — HYDROMORPHONE HCL 1 MG/ML IJ SOLN
0.2500 mg | INTRAMUSCULAR | Status: DC | PRN
  Administered 2016-06-14 (×4): 0.5 mg via INTRAVENOUS

## 2016-06-14 SURGICAL SUPPLY — 30 items
BLADE SAW RECIP 87.9 MT (BLADE) ×2 IMPLANT
BLADE SURG 21 STRL SS (BLADE) ×2 IMPLANT
BNDG COHESIVE 6X5 TAN STRL LF (GAUZE/BANDAGES/DRESSINGS) ×4 IMPLANT
BNDG GAUZE ELAST 4 BULKY (GAUZE/BANDAGES/DRESSINGS) ×4 IMPLANT
COVER SURGICAL LIGHT HANDLE (MISCELLANEOUS) ×2 IMPLANT
CUFF TOURNIQUET SINGLE 34IN LL (TOURNIQUET CUFF) IMPLANT
CUFF TOURNIQUET SINGLE 44IN (TOURNIQUET CUFF) IMPLANT
DRAPE INCISE IOBAN 66X45 STRL (DRAPES) IMPLANT
DRAPE U-SHAPE 47X51 STRL (DRAPES) ×2 IMPLANT
DRSG VAC ATS MED SENSATRAC (GAUZE/BANDAGES/DRESSINGS) ×2 IMPLANT
ELECT REM PT RETURN 9FT ADLT (ELECTROSURGICAL) ×2
ELECTRODE REM PT RTRN 9FT ADLT (ELECTROSURGICAL) ×1 IMPLANT
GLOVE BIOGEL PI IND STRL 9 (GLOVE) ×1 IMPLANT
GLOVE BIOGEL PI INDICATOR 9 (GLOVE) ×1
GLOVE SURG ORTHO 9.0 STRL STRW (GLOVE) ×2 IMPLANT
GOWN STRL REUS W/ TWL XL LVL3 (GOWN DISPOSABLE) ×2 IMPLANT
GOWN STRL REUS W/TWL XL LVL3 (GOWN DISPOSABLE) ×4
KIT BASIN OR (CUSTOM PROCEDURE TRAY) ×2 IMPLANT
KIT ROOM TURNOVER OR (KITS) ×2 IMPLANT
MANIFOLD NEPTUNE II (INSTRUMENTS) ×2 IMPLANT
NS IRRIG 1000ML POUR BTL (IV SOLUTION) ×2 IMPLANT
PACK ORTHO EXTREMITY (CUSTOM PROCEDURE TRAY) ×2 IMPLANT
PAD ARMBOARD 7.5X6 YLW CONV (MISCELLANEOUS) ×2 IMPLANT
SPONGE LAP 18X18 X RAY DECT (DISPOSABLE) IMPLANT
STAPLER VISISTAT 35W (STAPLE) ×2 IMPLANT
STOCKINETTE IMPERVIOUS LG (DRAPES) ×2 IMPLANT
SUT SILK 2 0 (SUTURE) ×2
SUT SILK 2-0 18XBRD TIE 12 (SUTURE) ×1 IMPLANT
SUT VIC AB 1 CTX 27 (SUTURE) IMPLANT
TOWEL OR 17X26 10 PK STRL BLUE (TOWEL DISPOSABLE) ×2 IMPLANT

## 2016-06-14 NOTE — Progress Notes (Signed)
Patient ID: Travis Weckwerth Sr., male   DOB: 08/03/1950, 66 y.o.   MRN: [id number] Postoperative day 0. Patient has no pain. Examination the wound VAC has no drainage. There is a good suction fit. We'll plan for discharge to skilled nursing as soon as a bed is available at [facility]. We'll have the wound VAC removed at time of discharge with follow-up in the office in 1-2 weeks. Compressive wrap after wound VAC is removed.

## 2016-06-14 NOTE — Transfer of Care (Signed)
Immediate Anesthesia Transfer of Care Note  Patient: Travis Barbaro Sr.  Procedure(s) Performed: Procedure(s): AMPUTATION BELOW KNEE (Right)  Patient Location: PACU  Anesthesia Type:General  Level of Consciousness: awake, alert , oriented and patient cooperative  Airway & Oxygen Therapy: Patient Spontanous Breathing and Patient connected to nasal cannula oxygen  Post-op Assessment: Report given to RN and Post -op Vital signs reviewed and stable  Post vital signs: Reviewed and stable  Last Vitals:  Vitals:   06/14/16 0739 06/14/16 0924  BP: 132/69   Pulse: 73   Resp: 20   Temp: 36.9 C (P) 36.4 C    Last Pain:  Vitals:   06/14/16 0748  TempSrc:   PainSc: 8       Patients Stated Pain Goal: 2 (75/05/18 3358)  Complications: No apparent anesthesia complications

## 2016-06-14 NOTE — Progress Notes (Signed)
Pt admitted to room 5N29 from Pacu via bed.  Pt alert and oriented. VSS. Denies pain at this time.  Pleasant, wound vac to right leg.  RBKA.

## 2016-06-14 NOTE — Op Note (Signed)
   Date of Surgery: 06/14/2016  INDICATIONS: [name] is a 66 y.o.-year-old male who has undergone limb salvage intervention and with recurrent ulceration with additional surgical intervention required for limb salvage patient elects to proceed with transtibial amputation.[name]  PREOPERATIVE DIAGNOSIS: Ulceration Charcot collapse right ankle  POSTOPERATIVE DIAGNOSIS: Same.  PROCEDURE: Transtibial amputation Application of Prevena wound VAC  SURGEON: Sharol Given, M.D.  ANESTHESIA:  general  IV FLUIDS AND URINE: See anesthesia.  2 g of TXA topical  ESTIMATED BLOOD LOSS: Minimal mL.  COMPLICATIONS: None.  DESCRIPTION OF PROCEDURE: The patient was brought to the operating room and underwent a general anesthetic. After adequate levels of anesthesia were obtained patient's lower extremity was prepped using DuraPrep draped into a sterile field. A timeout was called. The foot was draped out of the sterile field with impervious stockinette. A transverse incision was made 11 cm distal to the tibial tubercle. This curved proximally and a large posterior flap was created. The tibia was transected 1 cm proximal to the skin incision. The fibula was transected just proximal to the tibial incision. The tibia was beveled anteriorly. A large posterior flap was created. The sciatic nerve was pulled cut and allowed to retract. The vascular bundles were suture ligated with 2-0 silk. A sponge soaked in TXA was inserted into the amputation site at the time of closing and once the wound was closed the deep tissue was injected with remainder of the TXA 2 g. The deep and superficial fascial layers were closed using #1 Vicryl. The skin was closed using staples and 2-0 nylon. The wound was covered with a Prevena wound VAC. There was a good suction fit. A prosthetic shrinker was applied. Patient was extubated taken to the PACU in stable condition.  [name], MD [place] 9:27 AM

## 2016-06-14 NOTE — Anesthesia Postprocedure Evaluation (Signed)
Anesthesia Post Note  Patient: Sayyid Harewood Sr.  Procedure(s) Performed: Procedure(s) (LRB): AMPUTATION BELOW KNEE (Right)  Patient location during evaluation: PACU Anesthesia Type: General Level of consciousness: awake and alert Pain management: pain level controlled Vital Signs Assessment: post-procedure vital signs reviewed and stable Respiratory status: spontaneous breathing, nonlabored ventilation, respiratory function stable and patient connected to nasal cannula oxygen Cardiovascular status: blood pressure returned to baseline and stable Postop Assessment: no signs of nausea or vomiting Anesthetic complications: no       Last Vitals:  Vitals:   06/14/16 1115 06/14/16 1215  BP: (!) 115/58 128/63  Pulse: 64   Resp: 20 20  Temp:      Last Pain:  Vitals:   06/14/16 1215  TempSrc:   PainSc: 8                  Stephanye Finnicum,W. EDMOND

## 2016-06-14 NOTE — Anesthesia Preprocedure Evaluation (Signed)
Anesthesia Evaluation  Patient identified by MRN, date of birth, ID band Patient awake    Reviewed: Allergy & Precautions, H&P , NPO status , Patient's Chart, lab work & pertinent test results  Airway Mallampati: III  TM Distance: >3 FB Neck ROM: Full    Dental no notable dental hx. (+) Teeth Intact, Dental Advisory Given   Pulmonary sleep apnea , former smoker,    Pulmonary exam normal breath sounds clear to auscultation       Cardiovascular hypertension, Pt. on medications + CAD, + Past MI, + Cardiac Stents and + Peripheral Vascular Disease   Rhythm:Regular Rate:Normal     Neuro/Psych  Headaches, Anxiety Depression negative neurological ROS  negative psych ROS   GI/Hepatic negative GI ROS, Neg liver ROS,   Endo/Other  diabetes, Type 2, Oral Hypoglycemic Agents  Renal/GU negative Renal ROS  negative genitourinary   Musculoskeletal  (+) Arthritis , Osteoarthritis,    Abdominal   Peds  Hematology negative hematology ROS (+) anemia ,   Anesthesia Other Findings   Reproductive/Obstetrics negative OB ROS                             Anesthesia Physical Anesthesia Plan  ASA: III  Anesthesia Plan: General   Post-op Pain Management:    Induction: Intravenous  Airway Management Planned: LMA  Additional Equipment:   Intra-op Plan:   Post-operative Plan: Extubation in OR  Informed Consent: I have reviewed the patients History and Physical, chart, labs and discussed the procedure including the risks, benefits and alternatives for the proposed anesthesia with the patient or authorized representative who has indicated his/her understanding and acceptance.   Dental advisory given  Plan Discussed with: CRNA  Anesthesia Plan Comments:         Anesthesia Quick Evaluation

## 2016-06-14 NOTE — H&P (Signed)
Travis Pangilinan Sr. is an 66 y.o. male.   Chief Complaint: Ulceration medial malleolus right ankle with progressive deformity of the tibial calcaneal fusion. HPI: Patient is a 66 year old gentleman with diabetic insensate neuropathy who is status post internal fixation for right ankle fracture. Patient has had progressive Charcot collapse and ulceration. Patient underwent tibiocalcaneal fusion and with Charcot changes has had progressive valgus alignment with necrotic ulceration over the medial malleolus.  Past Medical History:  Diagnosis Date  . ALLERGIC RHINITIS 06/24/2009  . Allergic rhinitis 06/24/2009   Qualifier: Diagnosis of  By: Travis Reichmann MD, Travis [name]   . Anxiety 11/12/2011   Adequate for discharge   . Arthritis    "all my joints" (09/30/2013)  . Arthritis of foot, right, degenerative 04/15/2014  . Balance disorder 03/12/2013  . Benign neoplasm of cecum   . Benign neoplasm of descending colon   . CAD (coronary artery disease) 06/24/2009   5 stents placed in 2007    . Chronic anticoagulation   . Chronic pain syndrome 10/27/2009   of ankle, shoulders, low back.  sciatica.   . Closed fracture of right foot 10/17/2014  . CORONARY ARTERY DISEASE 06/24/2009   a. s/p multiple PCIs - In 2008 he had a Taxus DES to the mild LAD, Endeavor DES to mid LCX and distal LCX. In January 2009 he had DES to distal LCX, mid LCX and proximal LCX. In November 2009 had BMS x 2 to the mid RCA. Cath 10/2011 with patent stents, noncardiac CP. LHC 01/2013: patent stents (noncardiac CP).  . DEGENERATIVE JOINT DISEASE 06/24/2009   Qualifier: Diagnosis of  By: Travis Reichmann MD, Travis [name]   . Depression   . Depression with anxiety    Prior suicide attempt  . Rooks DISEASE, LUMBAR 04/19/2010  . ERECTILE DYSFUNCTION, ORGANIC 05/30/2010  . Essential hypertension 06/24/2009   Qualifier: Diagnosis of  By: Travis Reichmann MD, Travis [name]   . Fibromyalgia   . Fracture dislocation of ankle joint 09/02/2015  . Gait disorder 03/12/2013  . General weakness 07/14/2014   . GERD (gastroesophageal reflux disease) 09/08/2015  . Hand joint pain 06/10/2013  . Heart murmur   . Hepatitis C   . History of kidney stones   . Hyperlipidemia 07/15/2009   Qualifier: Diagnosis of  By: Travis Dubin, MD, Travis [name]    . HYPERLIPIDEMIA-MIXED 07/15/2009  . HYPERTENSION 06/24/2009  . Insomnia 10/04/2011  . Irregular heart beat   . Left hip pain 03/12/2013   Injected under ultrasound guidance on June 24, 2013   . Major depression 09/13/2015  . Myocardial infarction 2008  . Non-cardiac chest pain 10/2011, 01/2013  . Obesity   . Occult blood positive stool 10/17/2014  . Open ankle fracture 09/02/2015  . OSA (obstructive sleep apnea)    not using CPAP (09/30/2013)  . Pain of right thumb 04/03/2013  . Pneumonia   . PPD positive 04/08/2015  . Pre-ulcerative corn or callous 02/06/2013  . Restless legs   . Rotator cuff tear arthropathy of both shoulders 06/10/2013   History of bilateral shoulder cuff surgery for rotator cuff tears. Reports increase in pain 09/11/2015 during physical therapy of the left shoulder.   [name] SCIATICA, LEFT 04/19/2010   Qualifier: Diagnosis of  By: Travis Reichmann MD, Travis [name]   . Spinal stenosis in cervical region 09/26/2013  . Spinal stenosis, lumbar region, with neurogenic claudication 09/26/2013  . Type II diabetes mellitus ([place]) 2012   no meds in 09/2014.   [name] Uncontrolled type 2 DM with peripheral circulatory  disorder ([place]) 10/04/2013  . URETHRAL STRICTURE 06/24/2009   self catheterizes.     Past Surgical History:  Procedure Laterality Date  . ANKLE FUSION Right 04/15/2014   Procedure: Right Subtalar, Talonavicular Fusion;  Surgeon: Newt Minion, MD;  Location: [place];  Service: Orthopedics;  Laterality: Right;  . ANKLE FUSION Right 04/18/2016   Right Ankle Tibiocalcaneal Fusion; Removal of deep retained hardware; application of wound vac/notes 04/18/2016  . ANKLE FUSION Right 04/18/2016   Procedure: Right Ankle Tibiocalcaneal Fusion;  Surgeon: Newt Minion, MD;  Location:  [place];  Service: Orthopedics;  Laterality: Right;  . ANTERIOR CERVICAL DECOMP/DISCECTOMY FUSION N/A 09/26/2013   Procedure: ANTERIOR CERVICAL DISCECTOMY FUSION C3-4, plate and screw fixation, allograft bone graft;  Surgeon: Jessy Oto, MD;  Location: [place];  Service: Orthopedics;  Laterality: N/A;  . BACK SURGERY    . CARDIAC CATHETERIZATION  X 1  . CARPAL TUNNEL RELEASE Bilateral   . COLONOSCOPY    . COLONOSCOPY N/A 10/22/2014   Procedure: COLONOSCOPY;  Surgeon: Lafayette Dragon, MD;  Location: [facility] ENDOSCOPY;  Service: Endoscopy;  Laterality: N/A;  . CORONARY ANGIOPLASTY WITH STENT PLACEMENT     "I have 9 stents"  . ESOPHAGOGASTRODUODENOSCOPY N/A 10/19/2014   Procedure: ESOPHAGOGASTRODUODENOSCOPY (EGD);  Surgeon: Jerene Bears, MD;  Location: [facility] ENDOSCOPY;  Service: Endoscopy;  Laterality: N/A;  . FUSION OF TALONAVICULAR JOINT Right 04/15/2014   dr Solymar Grace  . HERNIA REPAIR     umbilical  . INGUINAL HERNIA REPAIR Right 05/11/2015   Procedure: LAPAROSCOPIC REPAIR RIGHT  INGUINAL HERNIA;  Surgeon: Greer Pickerel, MD;  Location: [place];  Service: General;  Laterality: Right;  . INSERTION OF MESH Right 05/11/2015   Procedure: INSERTION OF MESH;  Surgeon: Greer Pickerel, MD;  Location: [place];  Service: General;  Laterality: Right;  . JOINT REPLACEMENT Bilateral    knees  . KNEE CARTILAGE SURGERY Right X 12   "~ 1/2 open; ~ 1/2 scopes"  . KNEE CARTILAGE SURGERY Left X 3   "3 scopes"  . LEFT HEART CATHETERIZATION WITH CORONARY ANGIOGRAM N/A 02/10/2013   Procedure: LEFT HEART CATHETERIZATION WITH CORONARY ANGIOGRAM;  Surgeon: Burnell Blanks, MD;  Location: [facility] CATH LAB;  Service: Cardiovascular;  Laterality: N/A;  . LUMBAR LAMINECTOMY/DECOMPRESSION MICRODISCECTOMY N/A 01/27/2014   Procedure: CENTRAL LUMBAR LAMINECTOMY L4-5 AND L3-4;  Surgeon: Jessy Oto, MD;  Location: [place];  Service: Orthopedics;  Laterality: N/A;  . ORIF ANKLE FRACTURE Right 09/02/2015   Procedure: OPEN REDUCTION INTERNAL FIXATION  (ORIF) ANKLE FRACTURE;  Surgeon: Newt Minion, MD;  Location: [place];  Service: Orthopedics;  Laterality: Right;  . PERIPHERALLY INSERTED CENTRAL CATHETER INSERTION  09/02/2015  . SHOULDER ARTHROSCOPY W/ ROTATOR CUFF REPAIR Bilateral    "3 on the right; 1 on the left"  . SKIN SPLIT GRAFT Right 10/01/2015   Procedure: RIGHT ANKLE APPLY SKIN GRAFT SPLIT THICKNESS;  Surgeon: Newt Minion, MD;  Location: [place];  Service: Orthopedics;  Laterality: Right;  . TONSILLECTOMY    . TOTAL KNEE ARTHROPLASTY Bilateral 2008  . UMBILICAL HERNIA REPAIR     UHR  . URETHRAL DILATION  X 4  . VASECTOMY      Family History  Problem Relation Age of Onset  . Depression Mother   . Heart disease Mother   . Hypertension Mother   . Cancer Mother     Breast  . Diabetes Father   . Heart disease Father     CABG  .  Cancer Father     prostate and skin cancer  . Hypertension Father   . Hyperlipidemia Father   . Depression Brother     x 2  . Hypertension Brother     x2  . Heart disease Maternal Grandfather   . Early death Maternal Grandfather 24    heart attack  . Early death Paternal Grandfather   . Coronary artery disease Other   . Hypertension Other   . Depression Other    Social History:  reports that he quit smoking about 5 years ago. His smoking use included Cigars. He has never used smokeless tobacco. He reports that he does not drink alcohol or use drugs.  Allergies: No Known Allergies  No prescriptions prior to admission.    No results found for this or any previous visit (from the past 48 hour(s)). Xr Ankle Complete Right  Result Date: 06/13/2016 Two-view radiographs of the right ankle shows loss of reduction of the internal fixation secondary to the Charcot arthropathy. Patient has a more prominent medial malleolus.   Review of Systems  Cardiovascular: PND:  for wound healing discussed the potential for future surgical interventions and the progressive valgus deformity options other  than reconstruction would include a transtibial amputation. Patient states he wishes to proceed with a transtibial amputation at.  All other systems reviewed and are negative.   There were no vitals taken for this visit. Physical Exam  Examination patient is alert oriented no adenopathy well-dressed normal affect normal respiratory effort he does not have protective sensation he has a good dorsalis pedis pulse. Examination patient has a 3 cm necrotic ulcer over the medial malleolus. Radiographs shows no bony abnormalities but does show loosening of the tibial calcaneal fusion secondary to Charcot arthropathy. Assessment/Plan Assessment: Diabetic insensate neuropathy with progressive Charcot valgus deformity with necrotic ulcer medial malleolus right ankle status post tibial calcaneal fusion.  Plan: Discussed treatment options including excision of necrotic ulcer medial malleolus excision placement of wound VAC and further surgical interventions including potential for correction of the valgus alignment versus a transtibial amputation. Patient states he wishes to proceed with transtibial amputation at this time risk and benefits were discussed including risk of the wound not healing need for additional surgery. Patient wishes to proceed we'll plan for discharge to skilled nursing.    Newt Minion, MD 06/14/2016, 6:50 AM

## 2016-06-15 ENCOUNTER — Encounter (HOSPITAL_COMMUNITY): Payer: Self-pay | Admitting: Orthopedic Surgery

## 2016-06-15 LAB — GLUCOSE, CAPILLARY
GLUCOSE-CAPILLARY: 124 mg/dL — AB (ref 65–99)
GLUCOSE-CAPILLARY: 115 mg/dL — AB (ref 65–99)
GLUCOSE-CAPILLARY: 123 mg/dL — AB (ref 65–99)
Glucose-Capillary: 97 mg/dL (ref 65–99)

## 2016-06-15 NOTE — Evaluation (Signed)
Occupational Therapy Evaluation Patient Details Name: Travis Wilensky Sr. MRN: [id number] DOB: December 05, 1950 Today's Date: 06/15/2016    History of Present Illness Pt is a 66 y.o. male now s/p Rt BKA due to ulceration medial malleolus right ankle with progressive deformity of the tibial calcaneal fusion.   PMH: diabetes, stenosis, Rt rotator cuff tear, bilateral TKA, hepatitis C, fibromyalgia, CAD.   Clinical Impression   Pt was modified independent in ADL prior to admission. He presents with post op pain especially with R LE in a dependent position and generalized weakness interfering with ability to perform ADL and mobility at his baseline. Will follow acutely. Recommending ST rehab in SNF.    Follow Up Recommendations  SNF;Supervision/Assistance - 24 hour    Equipment Recommendations  None recommended by OT    Recommendations for Other Services       Precautions / Restrictions Precautions Precautions: Fall Precaution Comments: reviewed knee extension following BKA.  Restrictions Weight Bearing Restrictions: No      Mobility Bed Mobility Overal bed mobility: Needs Assistance Bed Mobility: Supine to Sit;Sit to Supine     Supine to sit: Min guard;HOB elevated Sit to supine: Min guard   General bed mobility comments: use of rail, HOB up  Transfers   Equipment used: Rolling walker (2 wheeled) Transfers: Sit to/from Stand Sit to Stand: Mod assist         General transfer comment: stood from bed x 1, bed elevated    Balance Overall balance assessment: Needs assistance   Sitting balance-Leahy Scale: Good       Standing balance-Leahy Scale: Poor Standing balance comment: using rw                            ADL Overall ADL's : Needs assistance/impaired Eating/Feeding: Independent;Bed level   Grooming: Set up;Sitting   Upper Body Bathing: Set up;Sitting   Lower Body Bathing: Moderate assistance;Sitting/lateral leans   Upper Body Dressing :  Set up;Sitting   Lower Body Dressing: Moderate assistance;Sitting/lateral leans                 General ADL Comments: Pt was performing seated bathing and dressing leaning side to side prior to admission. Spent 1 month in rehab in [facility] in November 2017.     Vision     Perception     Praxis      Pertinent Vitals/Pain Pain Assessment: Faces Faces Pain Scale: Hurts even more Pain Location: Rt LE Pain Descriptors / Indicators: Aching;Sharp Pain Intervention(s): Monitored during session;Repositioned     Hand Dominance Right   Extremity/Trunk Assessment Upper Extremity Assessment Upper Extremity Assessment: RUE deficits/detail RUE Deficits / Details: limited motion/strength due to rotator cuff tear. Approx. 45 degrees shoulder flexion.  RUE Coordination: decreased gross motor   Lower Extremity Assessment Lower Extremity Assessment: Defer to PT evaluation RLE Deficits / Details: able to raise Rt LE independently       Communication Communication Communication: No difficulties   Cognition Arousal/Alertness: Awake/alert Behavior During Therapy: WFL for tasks assessed/performed Overall Cognitive Status: Within Functional Limits for tasks assessed                     General Comments       Exercises       Shoulder Instructions      Home Living Family/patient expects to be discharged to:: Skilled nursing facility  Additional Comments: Typically lives with family.       Prior Functioning/Environment Level of Independence: Independent with assistive device(s)        Comments: used rw for mobility.         OT Problem List: Decreased strength;Impaired balance (sitting and/or standing);Obesity;Pain;Impaired UE functional use   OT Treatment/Interventions: Self-care/ADL training;DME and/or AE instruction;Patient/family education;Balance training;Therapeutic activities    OT Goals(Current goals  can be found in the care plan section) Acute Rehab OT Goals Patient Stated Goal: get back to moving/walking OT Goal Formulation: With patient Time For Goal Achievement: 06/29/16 Potential to Achieve Goals: Good ADL Goals Pt Will Perform Lower Body Bathing: with set-up;sitting/lateral leans Pt Will Perform Lower Body Dressing: with set-up;sitting/lateral leans Pt Will Transfer to Toilet: with supervision;stand pivot transfer;bedside commode Pt Will Perform Toileting - Clothing Manipulation and hygiene: sitting/lateral leans;with set-up  OT Frequency: Min 2X/week   Barriers to D/C:            Co-evaluation              End of Session Equipment Utilized During Treatment: Rolling walker;Gait belt  Activity Tolerance: Patient tolerated treatment well Patient left: in bed;with call bell/phone within reach   Time: 1530-1550 OT Time Calculation (min): 20 min Charges:  OT General Charges $OT Visit: 1 Procedure OT Evaluation $OT Eval Moderate Complexity: 1 Procedure G-Codes:    Malka So 06/15/2016, 5:05 PM [phone]

## 2016-06-15 NOTE — Progress Notes (Signed)
Travis Andrus Sr. 03-Feb-1951   Subjective: 1 Day Post-Op Procedure(s) (LRB): AMPUTATION BELOW KNEE (Right) Patient reports pain as mild.   No concerns voiced today. Did have some phantom pain overnight.   Objective: Vital signs in last 24 hours: Temp:  [97.5 F (36.4 C)-99 F (37.2 C)] 97.9 F (36.6 C) (01/25 0815) Pulse Rate:  [47-74] 74 (01/25 0815) Resp:  [9-33] 14 (01/25 0815) BP: (97-150)/(44-77) 101/49 (01/25 0815) SpO2:  [96 %-100 %] 98 % (01/25 0815)  Intake/Output from previous day: 01/24 0701 - 01/25 0700 In: 1528.5 [P.O.:840; I.V.:588.5; IV Piggyback:100] Out: 1725 [Urine:1625; Blood:100] Intake/Output this shift: No intake/output data recorded.   Recent Labs  06/14/16 0750  HGB 11.2*    Recent Labs  06/14/16 0750  WBC 5.6  RBC 4.04*  HCT 34.8*  PLT 250    Recent Labs  06/14/16 0750  NA 140  K 3.8  CL 111  CO2 21*  BUN 14  CREATININE 0.85  GLUCOSE 101*  CALCIUM 8.9    Recent Labs  06/14/16 0750  INR 1.17    Neurologically intact Incision: vac dressing in place, seal intact, vac working well Compartment soft  Assessment/Plan: 1 Day Post-Op Procedure(s) (LRB): AMPUTATION BELOW KNEE (Right) Up with therapy Plan for discharge tomorrow Discharge to [facility] Encouraged patient to call with any questions or concerns.   Suzan Slick 06/15/2016, 8:44 AM  [phone]

## 2016-06-15 NOTE — Evaluation (Signed)
Physical Therapy Evaluation Patient Details Name: Travis [name]. MRN: [id number] DOB: 10/13/1950 Today's Date: 06/15/2016   History of Present Illness  Pt is a 66 y.o. male now s/p Rt BKA due to ulceration medial malleolus right ankle with progressive deformity of the tibial calcaneal fusion.   PMH: diabetes, stenosis, Rt rotator cuff tear, bilateral TKA, hepatitis C, fibromyalgia, CAD.  Clinical Impression  Pt able to perform sit/stand X2 with mod assist and take 2 small hops forward but unable to turn safety to chair. Increased pain with mobility as anticipated. Pt motivated and assisting throughout session. Recommending SNF for further rehabilitation following acute stay.     Follow Up Recommendations SNF    Equipment Recommendations   (to be addressed at next venue)    Recommendations for Other Services       Precautions / Restrictions Precautions Precautions: Fall Precaution Comments: reviewed knee extension following BKA.  Restrictions Weight Bearing Restrictions: Yes LLE Weight Bearing: Non weight bearing      Mobility  Bed Mobility Overal bed mobility: Needs Assistance Bed Mobility: Supine to Sit;Sit to Supine     Supine to sit: Min guard (HOB elevated, using rails) Sit to supine: Min guard      Transfers Overall transfer level: Needs assistance Equipment used: Rolling walker (2 wheeled) Transfers: Sit to/from Stand Sit to Stand: Mod assist         General transfer comment: repeat X2 from EOB  Ambulation/Gait Ambulation/Gait assistance: Min assist Ambulation Distance (Feet): 1 Feet Assistive device: Rolling walker (2 wheeled) Gait Pattern/deviations:  (hop-to pattern) Gait velocity: slow   General Gait Details: pt able to take 2 small hops forward and back.   Stairs            Wheelchair Mobility    Modified Rankin (Stroke Patients Only)       Balance Overall balance assessment: Needs assistance Sitting-balance support: No upper  extremity supported Sitting balance-Leahy Scale: Good     Standing balance support: Bilateral upper extremity supported Standing balance-Leahy Scale: Poor Standing balance comment: using rw                             Pertinent Vitals/Pain Pain Assessment: 0-10 Pain Score: 7  Pain Location: Rt LE Pain Descriptors / Indicators: Aching;Sharp Pain Intervention(s): Limited activity within patient's tolerance;Monitored during session    Home Living Family/patient expects to be discharged to:: Skilled nursing facility                 Additional Comments: Typically lives with family.     Prior Function Level of Independence: Independent with assistive device(s)         Comments: used rw for mobility.      Hand Dominance        Extremity/Trunk Assessment   Upper Extremity Assessment Upper Extremity Assessment: RUE deficits/detail RUE Deficits / Details: limited motion/strength due to rotator cuff tear. Approx. 45 degrees shoulder flexion.     Lower Extremity Assessment Lower Extremity Assessment: RLE deficits/detail RLE Deficits / Details: able to raise Rt LE independently       Communication   Communication: No difficulties  Cognition Arousal/Alertness: Awake/alert Behavior During Therapy: WFL for tasks assessed/performed Overall Cognitive Status: Within Functional Limits for tasks assessed                      General Comments      Exercises  Assessment/Plan    PT Assessment Patient needs continued PT services  PT Problem List Decreased strength;Decreased range of motion;Decreased activity tolerance;Decreased balance;Decreased mobility          PT Treatment Interventions DME instruction;Gait training;Stair training;Functional mobility training;Therapeutic activities;Therapeutic exercise;Patient/family education    PT Goals (Current goals can be found in the Care Plan section)  Acute Rehab PT Goals Patient Stated Goal: get  back to moving/walking PT Goal Formulation: With patient Time For Goal Achievement: 06/29/16 Potential to Achieve Goals: Good    Frequency Min 2X/week   Barriers to discharge        Co-evaluation               End of Session Equipment Utilized During Treatment: Gait belt Activity Tolerance: Patient limited by pain Patient left: in bed;with call bell/phone within reach;with nursing/sitter in room (Rt LE in knee extension) Nurse Communication: Mobility status         Time: 4709-6283 PT Time Calculation (min) (ACUTE ONLY): 32 min   Charges:   PT Evaluation $PT Eval Moderate Complexity: 1 Procedure PT Treatments $Therapeutic Activity: 8-22 mins   PT G Codes:        Cassell Clement, PT, CSCS Pager [phone] Office [phone]  06/15/2016, 9:40 AM

## 2016-06-15 NOTE — Evaluation (Deleted)
Occupational Therapy Evaluation Patient Details Name: Travis [name]. MRN: [id number] DOB: 01/09/1951 Today's Date: 06/15/2016    History of Present Illness Pt is a 66 y.o. male now s/p Rt BKA due to ulceration medial malleolus right ankle with progressive deformity of the tibial calcaneal fusion.   PMH: diabetes, stenosis, Rt rotator cuff tear, bilateral TKA, hepatitis C, fibromyalgia, CAD.   Clinical Impression   Pt was performing ADL and mobility at a modified in    Follow Up Recommendations  SNF;Supervision/Assistance - 24 hour    Equipment Recommendations  None recommended by OT    Recommendations for Other Services       Precautions / Restrictions Precautions Precautions: Fall Precaution Comments: reviewed knee extension following BKA.  Restrictions Weight Bearing Restrictions: No      Mobility Bed Mobility Overal bed mobility: Needs Assistance Bed Mobility: Supine to Sit;Sit to Supine     Supine to sit: Min guard;HOB elevated Sit to supine: Min guard   General bed mobility comments: use of rail, HOB up  Transfers   Equipment used: Rolling walker (2 wheeled) Transfers: Sit to/from Stand Sit to Stand: Mod assist         General transfer comment: stood from bed x 1, bed elevated    Balance Overall balance assessment: Needs assistance   Sitting balance-Leahy Scale: Good       Standing balance-Leahy Scale: Poor Standing balance comment: using rw                            ADL Overall ADL's : Needs assistance/impaired Eating/Feeding: Independent;Bed level   Grooming: Set up;Sitting   Upper Body Bathing: Set up;Sitting   Lower Body Bathing: Moderate assistance;Sitting/lateral leans   Upper Body Dressing : Set up;Sitting   Lower Body Dressing: Moderate assistance;Sitting/lateral leans                 General ADL Comments: Pt was performing seated bathing and dressing leaning side to side prior to admission. Spent 1  month in rehab in [facility] in November 2017.     Vision     Perception     Praxis      Pertinent Vitals/Pain Pain Assessment: Faces Faces Pain Scale: Hurts even more Pain Location: Rt LE Pain Descriptors / Indicators: Aching;Sharp Pain Intervention(s): Monitored during session;Repositioned     Hand Dominance Right   Extremity/Trunk Assessment Upper Extremity Assessment Upper Extremity Assessment: RUE deficits/detail RUE Deficits / Details: limited motion/strength due to rotator cuff tear. Approx. 45 degrees shoulder flexion.  RUE Coordination: decreased gross motor   Lower Extremity Assessment Lower Extremity Assessment: Defer to PT evaluation RLE Deficits / Details: able to raise Rt LE independently       Communication Communication Communication: No difficulties   Cognition Arousal/Alertness: Awake/alert Behavior During Therapy: WFL for tasks assessed/performed Overall Cognitive Status: Within Functional Limits for tasks assessed                     General Comments       Exercises       Shoulder Instructions      Home Living Family/patient expects to be discharged to:: Skilled nursing facility                                 Additional Comments: Typically lives with family.  Prior Functioning/Environment Level of Independence: Independent with assistive device(s)        Comments: used rw for mobility.         OT Problem List: Decreased strength;Impaired balance (sitting and/or standing);Obesity;Pain;Impaired UE functional use   OT Treatment/Interventions: Self-care/ADL training;DME and/or AE instruction;Patient/family education;Balance training;Therapeutic activities    OT Goals(Current goals can be found in the care plan section) Acute Rehab OT Goals Patient Stated Goal: get back to moving/walking OT Goal Formulation: With patient Time For Goal Achievement: 06/29/16 Potential to Achieve Goals: Good ADL  Goals Pt Will Perform Lower Body Bathing: with set-up;sitting/lateral leans Pt Will Perform Lower Body Dressing: with set-up;sitting/lateral leans Pt Will Transfer to Toilet: with supervision;stand pivot transfer;bedside commode Pt Will Perform Toileting - Clothing Manipulation and hygiene: sitting/lateral leans;with set-up  OT Frequency: Min 2X/week   Barriers to D/C:            Co-evaluation              End of Session Equipment Utilized During Treatment: Rolling walker;Gait belt  Activity Tolerance: Patient tolerated treatment well Patient left: in bed;with call bell/phone within reach   Time: 1530-1550 OT Time Calculation (min): 20 min Charges:  OT General Charges $OT Visit: 1 Procedure OT Evaluation $OT Eval Moderate Complexity: 1 Procedure G-Codes:    Malka So 06/15/2016, 4:47 PM

## 2016-06-16 LAB — GLUCOSE, CAPILLARY
GLUCOSE-CAPILLARY: 128 mg/dL — AB (ref 65–99)
Glucose-Capillary: 102 mg/dL — ABNORMAL HIGH (ref 65–99)
Glucose-Capillary: 98 mg/dL (ref 65–99)
Glucose-Capillary: 98 mg/dL (ref 65–99)

## 2016-06-16 MED ORDER — GABAPENTIN 300 MG PO CAPS: 300.0000 mg | ORAL_CAPSULE | Freq: Three times a day (TID) | ORAL | 2 refills | Status: DC

## 2016-06-16 MED ORDER — METHOCARBAMOL 500 MG PO TABS: 500.0000 mg | ORAL_TABLET | Freq: Four times a day (QID) | ORAL | 0 refills | Status: DC | PRN

## 2016-06-16 NOTE — Discharge Summary (Signed)
Discharge Diagnoses:  Active Problems:   Below knee amputation status, right (HCC)   Charcot foot due to diabetes mellitus ([place])   Surgeries: Procedure(s): AMPUTATION BELOW KNEE on 06/14/2016    Discharged Condition: Improved  Hospital Course: Travis [name] Sr. is an 66 y.o. male who was admitted 06/14/2016 with a chief complaint of collapse of right ankle, charcot foot, right with infection. They were brought to the operating room on 06/14/2016 and underwent Procedure(s): [place].    Hospital course was uneventful. Wound vac discontinued and removed today. No drainage from incision. Wound vac canister empty. Incision well approximated with staples. Dry dressing and ace wrap applied. Shrinker given to patient. Will bring to follow up appointment.   They were given perioperative antibiotics: Anti-infectives    Start     Dose/Rate Route Frequency Ordered Stop   06/14/16 1500  ceFAZolin (ANCEF) IVPB 2g/100 mL premix     2 g 200 mL/hr over 30 Minutes Intravenous Every 6 hours 06/14/16 1404 06/15/16 0439   06/14/16 0845  ceFAZolin (ANCEF) IVPB 2g/100 mL premix     2 g 200 mL/hr over 30 Minutes Intravenous To Surgery 06/14/16 0841 06/14/16 0844    .  They were given sequential compression devices, early ambulation, and Other (comment) ASA for DVT prophylaxis.  Recent vital signs: Patient Vitals for the past 24 hrs:  BP Temp Temp src Pulse Resp SpO2  06/16/16 0500 (!) 110/52 98.1 F (36.7 C) Oral (!) 52 - 97 %  06/15/16 2130 (!) 122/54 98.9 F (37.2 C) Oral 65 16 98 %  06/15/16 1500 - - - (!) 59 - 100 %  06/15/16 1148 (!) 118/55 97.3 F (36.3 C) Oral - 15 100 %  .  Recent laboratory studies: No results found.  Discharge Medications:   Allergies as of 06/16/2016   No Known Allergies     Medication List    STOP taking these medications   cyclobenzaprine 10 MG tablet Commonly known as:  FLEXERIL     TAKE these medications   AMITIZA 24 MCG capsule Generic  drug:  lubiprostone Take 24 mcg by mouth 2 (two) times daily with a meal.   ARIPiprazole 5 MG tablet Commonly known as:  ABILIFY Take 1 tablet (5 mg total) by mouth daily. Take one tablet at bedtime   aspirin 81 MG tablet Take 81 mg by mouth daily.   atorvastatin 20 MG tablet Commonly known as:  LIPITOR TAKE 1 TABLET BY MOUTH EVERY DAY   benztropine 0.5 MG tablet Commonly known as:  COGENTIN Take 1 tablet (0.5 mg total) by mouth at bedtime.   clonazePAM 1 MG tablet Commonly known as:  KLONOPIN TAKE 1 TABLET BY MOUTH AT BEDTIME AS NEEDED FOR ANXIETY   Fish Oil 1000 MG Caps Take 1,000 mg by mouth daily.   FLUoxetine 40 MG capsule Commonly known as:  PROZAC TAKE 1 CAPSULE(40 MG) BY MOUTH DAILY   LYRICA 100 MG capsule Generic drug:  pregabalin Take 1 capsule (100 mg total) by mouth 3 (three) times daily.   metFORMIN 500 MG tablet Commonly known as:  GLUCOPHAGE Take 1 tablet (500 mg total) by mouth 2 (two) times daily with a meal.   methocarbamol 500 MG tablet Commonly known as:  ROBAXIN Take 1 tablet (500 mg total) by mouth every 6 (six) hours as needed for muscle spasms.   MYRBETRIQ 25 MG Tb24 tablet Generic drug:  mirabegron ER Take 25 mg by mouth daily.   oxyCODONE-acetaminophen 10-325  MG tablet Commonly known as:  PERCOCET Take 1 tablet by mouth every 4 (four) hours as needed for pain.   prasugrel 5 MG Tabs tablet Commonly known as:  EFFIENT Take 1 tablet (5 mg total) by mouth daily.   tamsulosin 0.4 MG Caps capsule Commonly known as:  FLOMAX Take 0.4 mg by mouth daily after breakfast.   traZODone 150 MG tablet Commonly known as:  DESYREL Take 1 tablet (150 mg total) by mouth at bedtime.   triamcinolone 55 MCG/ACT Aero nasal inhaler Commonly known as:  NASACORT AQ Place 2 sprays into the nose daily.       Diagnostic Studies: Xr Ankle Complete Right  Result Date: 06/13/2016 Two-view radiographs of the right ankle shows loss of reduction of the  internal fixation secondary to the Charcot arthropathy. Patient has a more prominent medial malleolus.  Xr Ankle Complete Right  Result Date: 05/19/2016 Three views of ankle show stable alignment of fusion hardware. No complicating features.    They benefited maximally from their hospital stay and there were no complications.    Wound care: Cleanse incision daily with dial soap and water. Apply dry dressing and ace wrap for compression. May wear shrinker over ace dressing.   Disposition: 03-Skilled Nursing Facility -Discharge to [place].   Follow-up Information    Newt Minion, MD Follow up in 2 week(s).   Specialty:  Orthopedic Surgery Contact information: [place] [address] [phone]            Author: Dondra Prader, [place] 06/16/2016 8:39 AM Addendum:   06/18/16.   No change in patients status. SL removed. Dressing stays on until he sees Dr. Sharol Given later this week.

## 2016-06-16 NOTE — Clinical Social Work Note (Signed)
Clinical Social Work Assessment  Patient Details  Name: Travis Gallon Sr. MRN: [id number] Date of Birth: [birth year]  Date of referral:  06/16/16               Reason for consult:  Facility Placement                Permission sought to share information with:  Family Supports Permission granted to share information::  Yes, Verbal Permission Granted  Name::     [name]  Agency::     Relationship::  Brother  Contact Information:  [phone]  Housing/Transportation Living arrangements for the past 2 months:  [place] of Information:  Patient Patient Interpreter Needed:  None Criminal Activity/Legal Involvement Pertinent to Current Situation/Hospitalization:  No - Comment as needed Significant Relationships:  Siblings, Other Family Members Lives with:  Adult Children, Minor Children (Pt lives w/ neice) Do you feel safe going back to the place where you live?  Yes Need for family participation in patient care:     Care giving concerns:  No family or friends present at bedside during initial assessment. Pt gave CSW verbal permission for CSW to contact his niece or brother.   Social Worker assessment / plan:  CSW spoke with pt at bedside to complete initial assessment. Pt lives at home with his niece. Pt is agreeable to SNF placement at d/c. Pt wants to go to [facility]. CSW will send referral and follow up with pt.   Employment status:  Retired Nurse, adult PT Recommendations:  [place] / Referral to community resources:  [place]  Patient/Family's Response to care:  Pt verbalized understanding of CSW role and expressed appreciation for support. Pt denies any concern regarding pt care at this time.   Patient/Family's Understanding of and Emotional Response to Diagnosis, Current Treatment, and Prognosis:  Pt understanding and realistic regarding physical limitations. Pt understands the  need for SNF placement at d/c. Pt agreeable to SNF placement at d/c, at this time. Pt's responses emotionally appropriate during conversation with CSW. Pt denies any concern regarding treatment plan at this time. CSW will continue to provide support and facilitate d/c needs.   Emotional Assessment Appearance:  Appears stated age Attitude/Demeanor/Rapport:   (Patient was appropriate.) Affect (typically observed):  Accepting, Appropriate, Calm Orientation:  Oriented to Self, Oriented to Place, Oriented to  Time, Oriented to Situation Alcohol / Substance use:  Not Applicable Psych involvement (Current and /or in the community):  No (Comment)  Discharge Needs  Concerns to be addressed:  No discharge needs identified Readmission within the last 30 days:  No Current discharge risk:  Dependent with Mobility Barriers to Discharge:  Continued Medical Work up   [employer], LCSW 06/16/2016, 11:21 AM

## 2016-06-16 NOTE — NC FL2 (Signed)
[place] LEVEL OF CARE SCREENING TOOL     IDENTIFICATION  Patient Name: Travis Splawn Sr. Birthdate: Jul 29, 1950 Sex: male Admission Date (Current Location): 06/14/2016  [facility] and [place] Number:  Herbalist and Address:  The [place], [place] [address], [place], [place] [address]      Provider Number: [id number]  Attending Physician Name and Address:  Newt Minion, MD  Relative Name and Phone Number:       Current Level of Care: Hospital Recommended Level of Care: [place] Prior Approval Number:    Date Approved/Denied: 09/27/13 PASRR Number: [id number]  Discharge Plan: SNF    Current Diagnoses: Patient Active Problem List   Diagnosis Date Noted  . Below knee amputation status, right ([place]) 06/14/2016  . Charcot foot due to diabetes mellitus ([place])   . S/P ankle fusion 04/18/2016  . Charcot's arthropathy associated with type 2 diabetes mellitus ([place]) 04/11/2016  . Mechanical breakdown of internal fixation device of bone of right lower leg ([place]) 04/11/2016  . Encounter for well adult exam with abnormal findings 11/05/2015  . Major depression 09/13/2015  . S/P TKR (total knee replacement) bilaterally 09/13/2015  . GERD (gastroesophageal reflux disease) 09/08/2015  . Open ankle fracture 09/02/2015  . Fracture dislocation of ankle joint 09/02/2015  . S/P laparoscopic hernia repair 05/11/2015  . PPD positive 04/08/2015  . Benign neoplasm of descending colon   . Benign neoplasm of cecum   . AKI (acute kidney injury) ([place]) 10/18/2014  . Acute blood loss anemia   . Chronic anticoagulation   . Closed fracture of right foot 10/17/2014  . Occult blood positive stool 10/17/2014  . General weakness 07/14/2014  . Urinary incontinence 07/14/2014  . Arthritis of foot, right, degenerative 04/15/2014  . Headache(784.0) 10/15/2013  . Spinal stenosis in cervical region 09/26/2013    Class: Chronic  . Spinal  stenosis, lumbar region, with neurogenic claudication 09/26/2013    Class: Chronic  . Hand joint pain 06/10/2013  . Rotator cuff tear arthropathy of both shoulders 06/10/2013  . Skin lesion of cheek 05/01/2013  . Pain of right thumb 04/03/2013  . Balance disorder 03/12/2013  . Gait disorder 03/12/2013  . Tremor 03/12/2013  . Left hip pain 03/12/2013  . Pre-ulcerative corn or callous 02/06/2013  . Anxiety 11/12/2011  . OSA (obstructive sleep apnea) 11/07/2011  . Bradycardia 10/20/2011  . Insomnia 10/04/2011  . Obesity 01/12/2011  . ERECTILE DYSFUNCTION, ORGANIC 05/30/2010  . [place] DISEASE, LUMBAR 04/19/2010  . SCIATICA, LEFT 04/19/2010  . Chronic pain syndrome 10/27/2009  . Hyperlipidemia 07/15/2009  . Essential hypertension 06/24/2009  . CAD (coronary artery disease) 06/24/2009  . Allergic rhinitis 06/24/2009  . URETHRAL STRICTURE 06/24/2009  . DEGENERATIVE JOINT DISEASE 06/24/2009  . SHOULDER PAIN, BILATERAL 06/24/2009  . FATIGUE 06/24/2009  . NEPHROLITHIASIS, HX OF 06/24/2009    Orientation RESPIRATION BLADDER Height & Weight     Self, Time, Situation, Place  Normal Continent Weight: 276 lb (125.2 kg) Height:  6' 1"  (185.4 cm)  BEHAVIORAL SYMPTOMS/MOOD NEUROLOGICAL BOWEL NUTRITION STATUS      Continent  (Please see d/c summary)  AMBULATORY STATUS COMMUNICATION OF NEEDS Skin   Limited Assist Verbally Surgical wounds, Wound Vac (Closed incision right leg (wound vac 125 mmHg), right ankle.)                       Personal Care Assistance Level of Assistance  Bathing, Feeding, Dressing Bathing Assistance:  Limited assistance Feeding assistance: Independent Dressing Assistance: Limited assistance     Functional Limitations Info  Sight, Hearing, Speech Sight Info: Adequate Hearing Info: Adequate Speech Info: Adequate    SPECIAL CARE FACTORS FREQUENCY  OT (By licensed OT), PT (By licensed PT)     PT Frequency: min 2x week OT Frequency:  min 2x week             Contractures Contractures Info: Not present    Additional Factors Info  Code Status, Allergies Code Status Info: Full Code Allergies Info: No known allergies           Current Medications (06/16/2016):  This is the current hospital active medication list Current Facility-Administered Medications  Medication Dose Route Frequency Provider Last Rate Last Dose  . 0.9 %  sodium chloride infusion   Intravenous Continuous Newt Minion, MD 10 mL/hr at 06/14/16 1409    . acetaminophen (TYLENOL) tablet 650 mg  650 mg Oral Q6H PRN Newt Minion, MD       Or  . acetaminophen (TYLENOL) suppository 650 mg  650 mg Rectal Q6H PRN Newt Minion, MD      . ARIPiprazole (ABILIFY) tablet 5 mg  5 mg Oral Daily Newt Minion, MD   5 mg at 06/15/16 2229  . aspirin chewable tablet 81 mg  81 mg Oral Daily Assunta Found Stone, RPH   81 mg at 06/16/16 1000  . atorvastatin (LIPITOR) tablet 20 mg  20 mg Oral Daily Newt Minion, MD   20 mg at 06/15/16 1615  . bisacodyl (DULCOLAX) suppository 10 mg  10 mg Rectal Daily PRN Newt Minion, MD      . clonazePAM [place]bye [place]) tablet 1 mg  1 mg Oral QHS PRN Newt Minion, MD   1 mg at 06/15/16 2233  . docusate sodium (COLACE) capsule 100 mg  100 mg Oral BID Newt Minion, MD   100 mg at 06/15/16 2200  . FLUoxetine (PROZAC) capsule 40 mg  40 mg Oral Daily Newt Minion, MD   40 mg at 06/16/16 0854  . gabapentin (NEURONTIN) capsule 300 mg  300 mg Oral TID Newt Minion, MD   300 mg at 06/16/16 0854  . HYDROmorphone (DILAUDID) injection 1 mg  1 mg Intravenous Q2H PRN Newt Minion, MD   1 mg at 06/15/16 0411  . insulin aspart (novoLOG) injection 0-15 Units  0-15 Units Subcutaneous TID WC Newt Minion, MD   2 Units at 06/15/16 [phone]  . lubiprostone (AMITIZA) capsule 24 mcg  24 mcg Oral BID WC Newt Minion, MD   24 mcg at 06/16/16 0854  . magnesium citrate solution 1 Bottle  1 Bottle Oral Once PRN [name] V, MD      . metFORMIN (GLUCOPHAGE) tablet 500 mg  500 mg Oral BID WC  Newt Minion, MD   500 mg at 06/16/16 0854  . methocarbamol (ROBAXIN) tablet 500 mg  500 mg Oral Q6H PRN Newt Minion, MD   500 mg at 06/16/16 0701   Or  . methocarbamol (ROBAXIN) 500 mg in dextrose 5 % 50 mL IVPB  500 mg Intravenous Q6H PRN Newt Minion, MD      . metoCLOPramide (REGLAN) tablet 5-10 mg  5-10 mg Oral Q8H PRN Newt Minion, MD       Or  . metoCLOPramide (REGLAN) injection 5-10 mg  5-10 mg Intravenous Q8H PRN Newt Minion, MD      .  mirabegron ER (MYRBETRIQ) tablet 25 mg  25 mg Oral Daily Newt Minion, MD   25 mg at 06/16/16 0854  . omega-3 acid ethyl esters (LOVAZA) capsule 1 g  1 g Oral Daily Newt Minion, MD   1 g at 06/16/16 0855  . ondansetron (ZOFRAN) tablet 4 mg  4 mg Oral Q6H PRN Newt Minion, MD       Or  . ondansetron [facility]) injection 4 mg  4 mg Intravenous Q6H PRN [name] V, MD      . oxyCODONE (Oxy IR/ROXICODONE) immediate release tablet 5-10 mg  5-10 mg Oral Q3H PRN Newt Minion, MD   10 mg at 06/16/16 1024  . polyethylene glycol (MIRALAX / GLYCOLAX) packet 17 g  17 g Oral Daily PRN [name] V, MD      . prasugrel (EFFIENT) tablet 5 mg  5 mg Oral Daily Newt Minion, MD   5 mg at 06/16/16 0855  . tamsulosin (FLOMAX) capsule 0.4 mg  0.4 mg Oral QPC breakfast Newt Minion, MD   0.4 mg at 06/16/16 0854  . traZODone (DESYREL) tablet 150 mg  150 mg Oral QHS Newt Minion, MD   150 mg at 06/15/16 2230  . triamcinolone (NASACORT) nasal inhaler 2 spray  2 spray Nasal Daily Newt Minion, MD   2 spray at 06/16/16 1000     Discharge Medications: Please see discharge summary for a list of discharge medications.  Relevant Imaging Results:  Relevant Lab Results:   Additional Information SSN: [id number]  Alla German, LCSW

## 2016-06-16 NOTE — Plan of Care (Signed)
Problem: Education: Goal: Knowledge of [place] [facility] Education information/materials will improve Outcome: Progressing POC reviewed with pt.   

## 2016-06-17 LAB — GLUCOSE, CAPILLARY
Glucose-Capillary: 98 mg/dL (ref 65–99)
Glucose-Capillary: 112 mg/dL — ABNORMAL HIGH (ref 65–99)
Glucose-Capillary: 96 mg/dL (ref 65–99)
Glucose-Capillary: 93 mg/dL (ref 65–99)

## 2016-06-17 NOTE — Progress Notes (Signed)
   Subjective: 3 Days Post-Op Procedure(s) (LRB): AMPUTATION BELOW KNEE (Right) Patient reports pain as mild and moderate.    Objective: Vital signs in last 24 hours: Temp:  [97.7 F (36.5 C)-98.7 F (37.1 C)] 97.7 F (36.5 C) (01/27 0403) Pulse Rate:  [63-77] 77 (01/27 0403) Resp:  [16] 16 (01/27 0403) BP: (104-130)/(50-80) 104/80 (01/27 0403) SpO2:  [97 %-98 %] 97 % (01/27 0403)  Intake/Output from previous day: 01/26 0701 - 01/27 0700 In: 720 [P.O.:720] Out: 2800 [Urine:2800] Intake/Output this shift: No intake/output data recorded.  No results for input(s): HGB in the last 72 hours. No results for input(s): WBC, RBC, HCT, PLT in the last 72 hours. No results for input(s): NA, K, CL, CO2, BUN, CREATININE, GLUCOSE, CALCIUM in the last 72 hours. No results for input(s): LABPT, INR in the last 72 hours.  Neurologically intact No results found.  Assessment/Plan: 3 Days Post-Op Procedure(s) (LRB): AMPUTATION BELOW KNEE (Right) Discharge to SNF  Travis [name] 06/17/2016, 11:45 AM

## 2016-06-18 LAB — GLUCOSE, CAPILLARY
GLUCOSE-CAPILLARY: 106 mg/dL — AB (ref 65–99)
GLUCOSE-CAPILLARY: 105 mg/dL — AB (ref 65–99)

## 2016-06-18 MED ORDER — OXYCODONE-ACETAMINOPHEN 10-325 MG PO TABS: 1.0000 | ORAL_TABLET | ORAL | 0 refills | Status: DC | PRN

## 2016-06-18 NOTE — Progress Notes (Signed)
CSW contacted SNF [facility] to see about patient's return on today. Per Claudette Head, patient can transport to facility once stable. 5 day LOG approved by [employer]. Discharge Summary received. CSW contacted unit RN to inform. Per RN Hinton Dyer, patient will need hard scripts from MD. RN notified MD. Once hard scripts are received. CSW will arrange transportation for patient to return back to [facility]. No other needs reported at this time. CSW will continue to follow.   Lucius Conn, [place] Worker [facility] Ph: [phone]

## 2016-06-18 NOTE — Progress Notes (Signed)
   Subjective: 4 Days Post-Op Procedure(s) (LRB): AMPUTATION BELOW KNEE (Right) Patient reports pain as moderate.  pateint was on percocet 6 tabs a day before surgery by Pain Management doctor. He is getting his 2 oercocet 5/325 every 3 hrs if he can.   Objective: Vital signs in last 24 hours: Temp:  [98.1 F (36.7 C)-98.6 F (37 C)] 98.1 F (36.7 C) (01/28 0404) Pulse Rate:  [71-81] 81 (01/28 0404) Resp:  [16] 16 (01/28 0404) BP: (111-116)/(68) 116/68 (01/28 0404) SpO2:  [97 %] 97 % (01/28 0404)  Intake/Output from previous day: 01/27 0701 - 01/28 0700 In: 560 [P.O.:440; I.V.:120] Out: 1400 [Urine:1400] Intake/Output this shift: No intake/output data recorded.  No results for input(s): HGB in the last 72 hours. No results for input(s): WBC, RBC, HCT, PLT in the last 72 hours. No results for input(s): NA, K, CL, CO2, BUN, CREATININE, GLUCOSE, CALCIUM in the last 72 hours. No results for input(s): LABPT, INR in the last 72 hours.  dressing dry , one dry spot from 2 days ago.  No results found.  Assessment/Plan: 4 Days Post-Op Procedure(s) (LRB): AMPUTATION BELOW KNEE (Right) Discharge to SNF ,  If he will keep his stump elevated he will have less pain. Discussed with him again.   Marybelle Killings 06/18/2016, 10:48 AM

## 2016-06-18 NOTE — Progress Notes (Signed)
Patient to be transported via [facility] on today. D/C Summary received by facility. No further needs were requested at this time. CSW to sign off.   Please re-consult if further CSW needs arise.   Lucius Conn, [place] Worker [facility] Ph: [phone]

## 2016-06-21 ENCOUNTER — Encounter (INDEPENDENT_AMBULATORY_CARE_PROVIDER_SITE_OTHER): Payer: Self-pay | Admitting: Orthopedic Surgery

## 2016-06-21 ENCOUNTER — Ambulatory Visit (INDEPENDENT_AMBULATORY_CARE_PROVIDER_SITE_OTHER): Payer: Medicare PPO | Admitting: Orthopedic Surgery

## 2016-06-21 VITALS — Ht 73.0 in | Wt 276.0 lb

## 2016-06-21 DIAGNOSIS — Z89511 Acquired absence of right leg below knee: Secondary | ICD-10-CM

## 2016-06-21 DIAGNOSIS — IMO0002 Reserved for concepts with insufficient information to code with codable children: Secondary | ICD-10-CM

## 2016-06-21 IMAGING — DX DG ELBOW 2V*R*
2 series · 2 of 2 positions shown · non-contrast
Comparison: None.

CLINICAL DATA: Trauma, fall, hypotension

EXAM:
RIGHT ELBOW - 2 VIEW

[elbow ap]
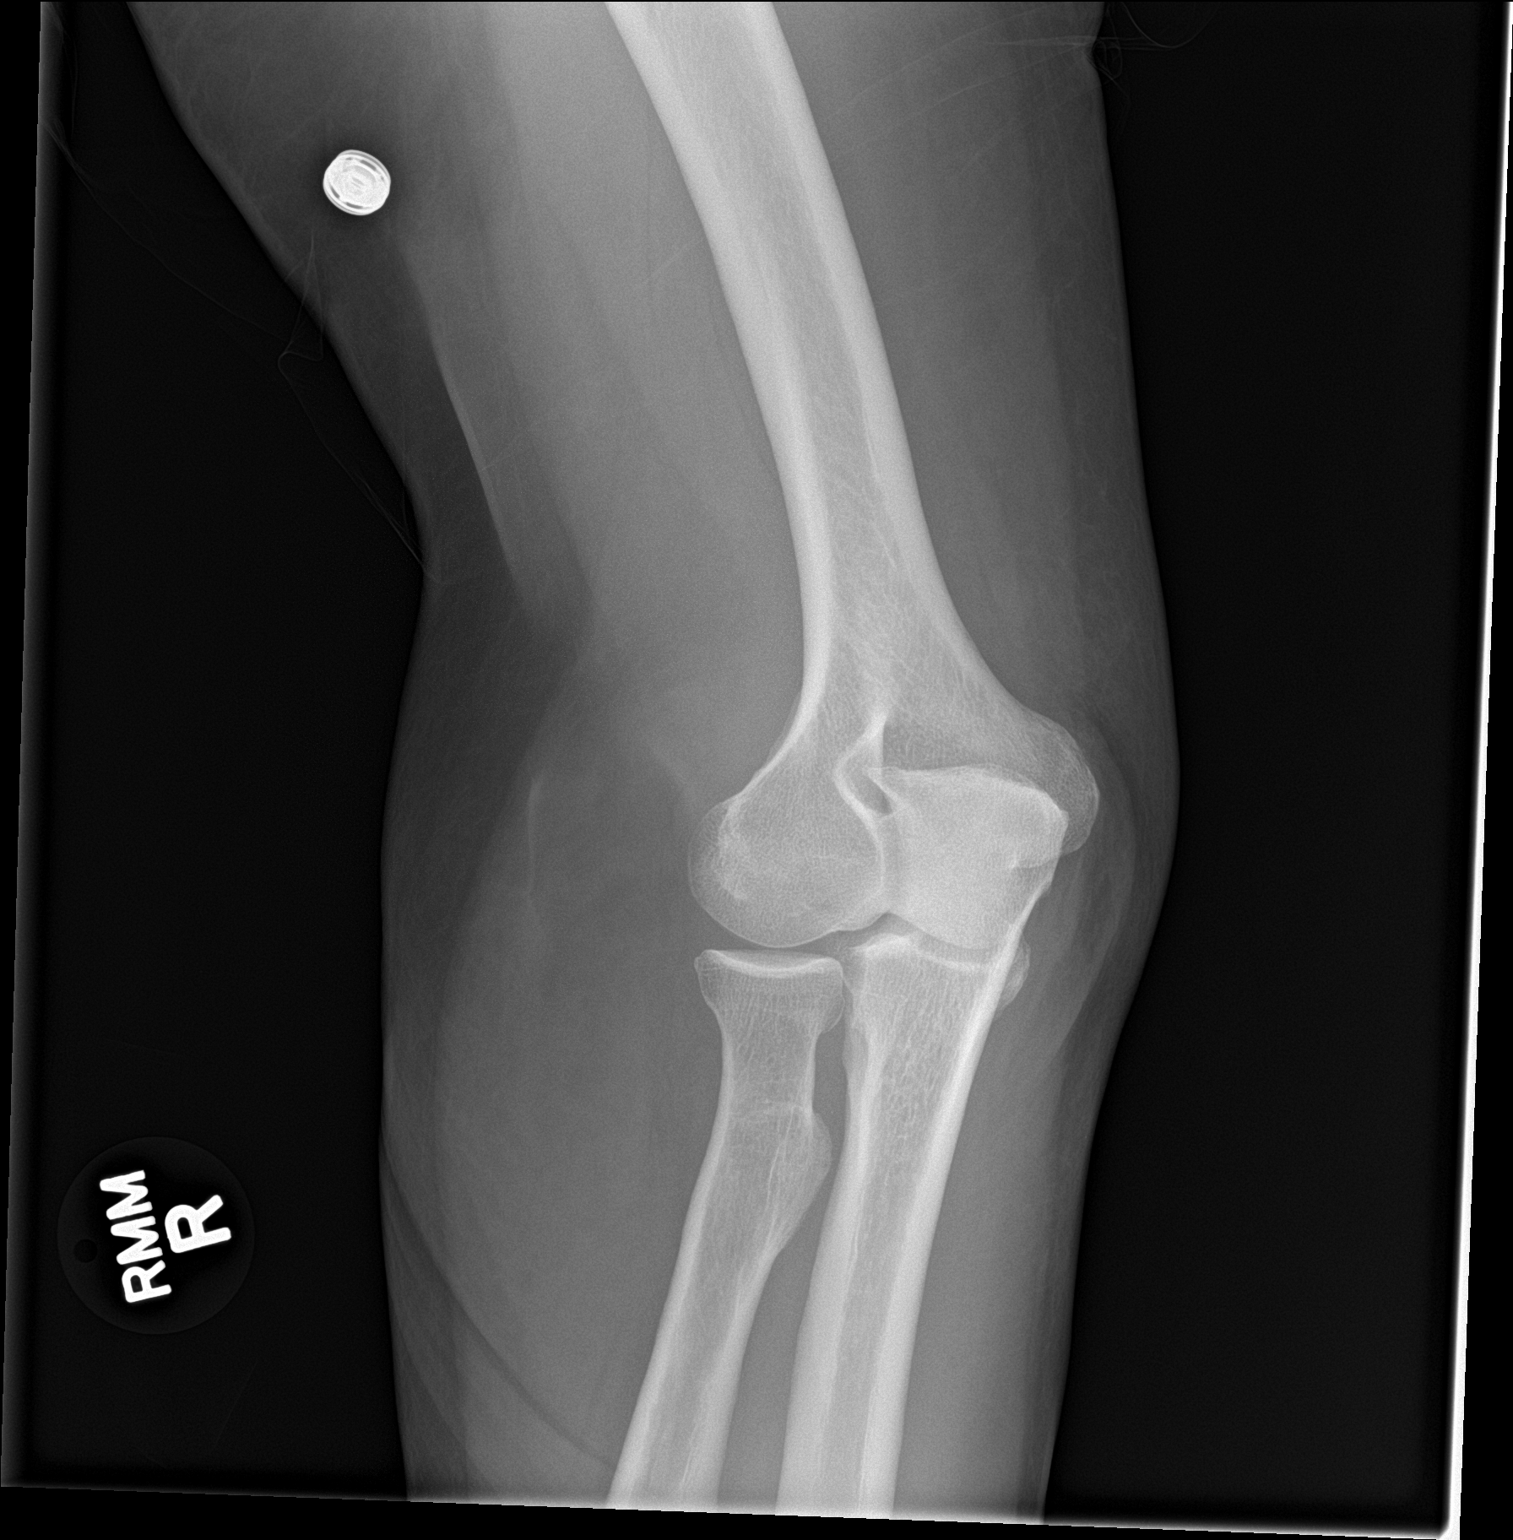

[elbow lat]
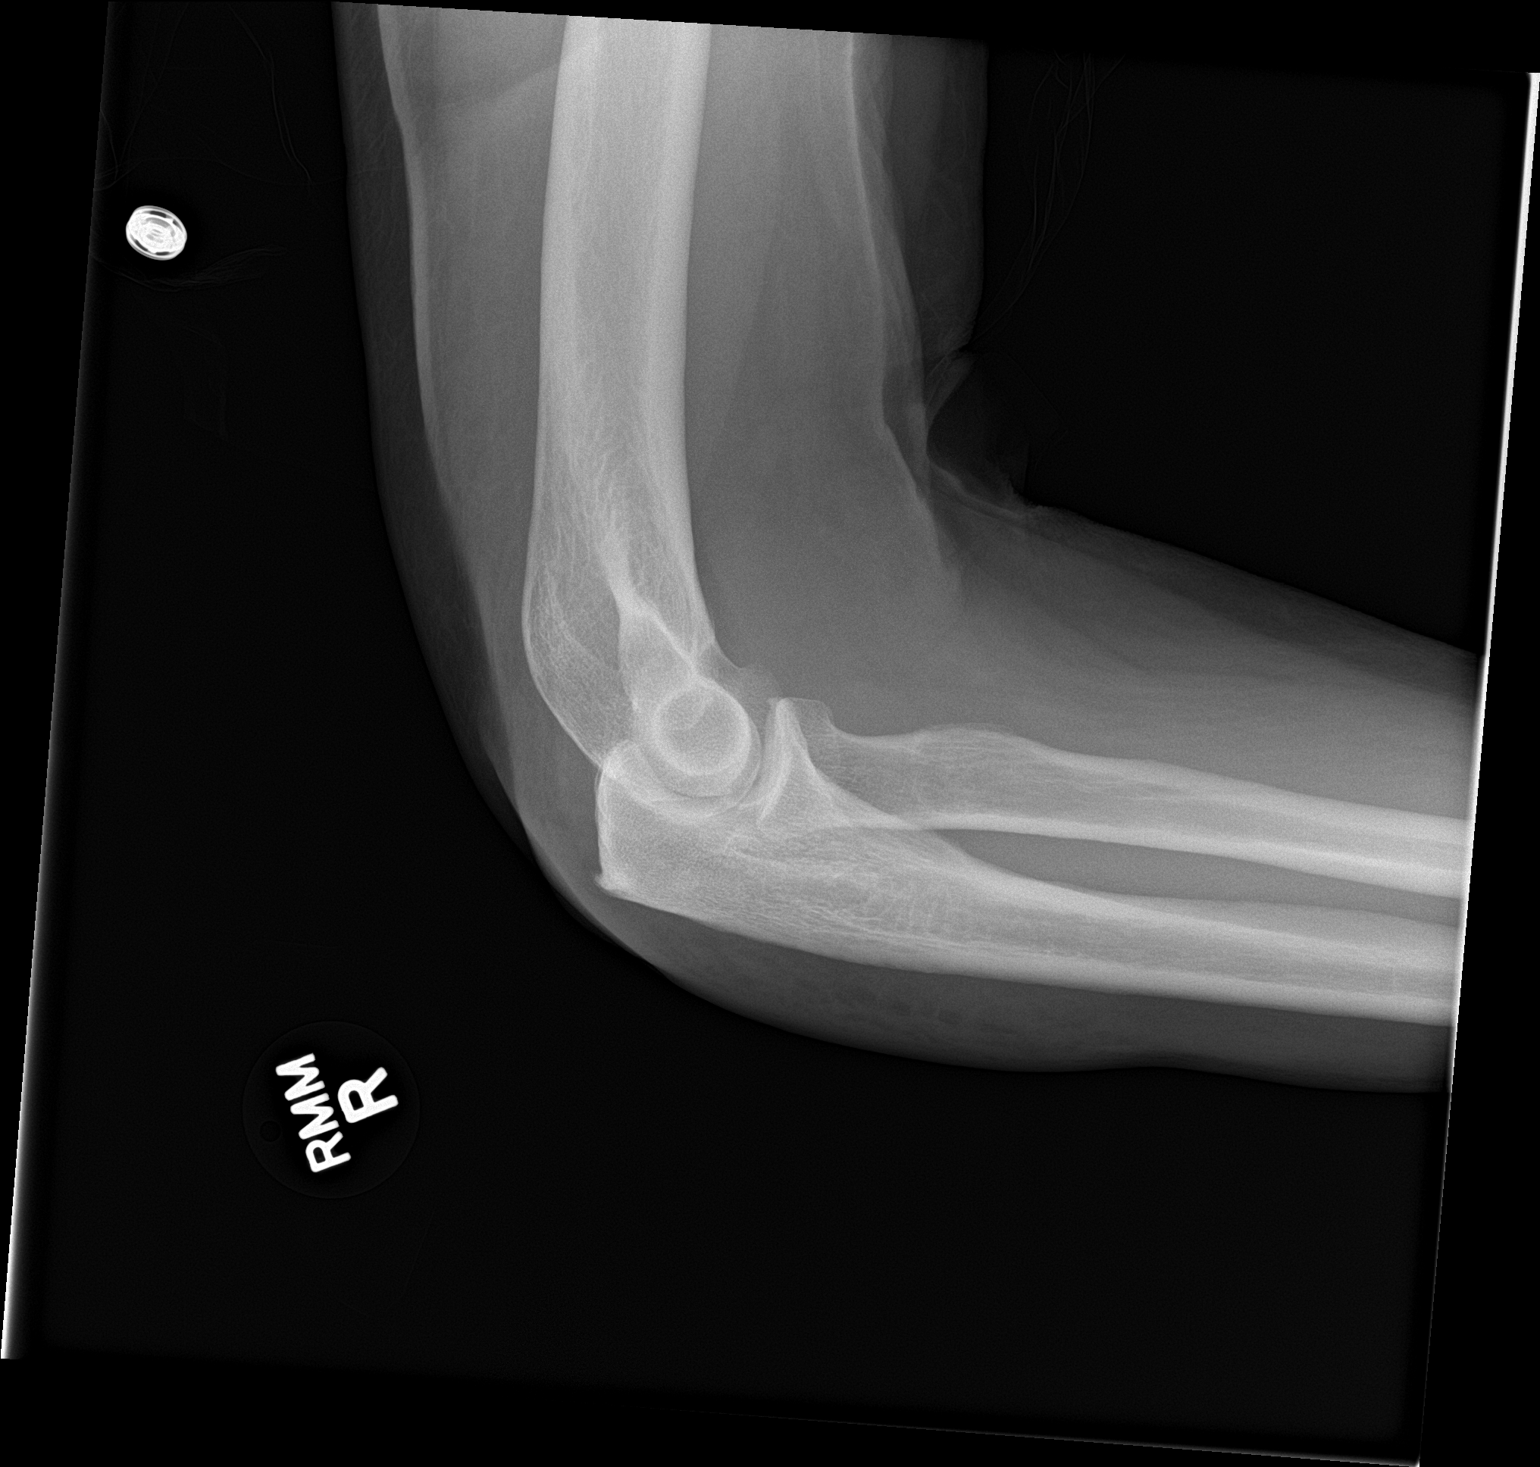

[2 of 2 positions shown; findings below may reference images not displayed]

FINDINGS: Two views of right elbow submitted. No acute fracture or
subluxation. No posterior fat pad sign. Mild spurring of olecranon.
Mild soft tissue swelling dorsal proximal forearm.
IMPRESSION: No acute fracture or subluxation. Mild degenerative changes. Mild
soft tissue swelling dorsal proximal forearm.
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
## 2016-06-21 NOTE — Progress Notes (Signed)
Office Visit Note   Patient: Travis Leonhardt Sr.           Date of Birth: [birth year]           MRN: [id number] Visit Date: 06/21/2016              Requested by: [name], MD [place], [place] [address] PCP: [name], MD   Assessment & Plan: Visit Diagnoses:  1. Below knee amputation status, right ([place])     Plan: Him continue with daily Dial soap cleansing. Dry dressings. Please wear the shrinker over the Ace wrap. Follow-up in office in 2 more weeks. Hope to harvest staples at that time. With [facility] in the past and would like to get his prosthetic from them.  Follow-Up Instructions: Return in about 2 weeks (around 07/05/2016).   Orders:  No orders of the defined types were placed in this encounter.  No orders of the defined types were placed in this encounter.     Procedures: No procedures performed   Clinical Data: No additional findings.   Subjective: Chief Complaint  Patient presents with  . Right Knee - Routine Post Op    Patient returns status post right BKA on 06/14/2016. He is 7 days post op.  The Prevena wound vac was DC 'd on Friday, January 26. The limb is swollen and staples are intact. Patient is currently at [facility]. He states they are not putting on the shrinker.     Review of Systems  Constitutional: Negative for chills and fever.     Objective: Vital Signs: Ht 6' 1"  (1.854 m)   Wt 276 lb (125.2 kg)   BMI 36.41 kg/m   Physical Exam The incision is well approximated this staples. There is some bleeding centrally. This is easily controlled with gauze. There is no surrounding erythema odor drainage sign of infection. Does have moderate swelling. Encouraged him to work on elevation and compression. Ortho Exam  Specialty Comments:  No specialty comments available.  Imaging: No results found.   PMFS History: Patient Active Problem List   Diagnosis Date Noted  . Below knee amputation status, right ([place])  06/14/2016  . Charcot foot due to diabetes mellitus ([place])   . S/P ankle fusion 04/18/2016  . Charcot's arthropathy associated with type 2 diabetes mellitus ([place]) 04/11/2016  . Mechanical breakdown of internal fixation device of bone of right lower leg ([place]) 04/11/2016  . Encounter for well adult exam with abnormal findings 11/05/2015  . Major depression 09/13/2015  . S/P TKR (total knee replacement) bilaterally 09/13/2015  . GERD (gastroesophageal reflux disease) 09/08/2015  . Open ankle fracture 09/02/2015  . Fracture dislocation of ankle joint 09/02/2015  . S/P laparoscopic hernia repair 05/11/2015  . PPD positive 04/08/2015  . Benign neoplasm of descending colon   . Benign neoplasm of cecum   . AKI (acute kidney injury) ([place]) 10/18/2014  . Acute blood loss anemia   . Chronic anticoagulation   . Closed fracture of right foot 10/17/2014  . Occult blood positive stool 10/17/2014  . General weakness 07/14/2014  . Urinary incontinence 07/14/2014  . Arthritis of foot, right, degenerative 04/15/2014  . Headache(784.0) 10/15/2013  . Spinal stenosis in cervical region 09/26/2013    Class: Chronic  . Spinal stenosis, lumbar region, with neurogenic claudication 09/26/2013    Class: Chronic  . Hand joint pain 06/10/2013  . Rotator cuff tear arthropathy of both shoulders 06/10/2013  . Skin lesion of cheek  05/01/2013  . Pain of right thumb 04/03/2013  . Balance disorder 03/12/2013  . Gait disorder 03/12/2013  . Tremor 03/12/2013  . Left hip pain 03/12/2013  . Pre-ulcerative corn or callous 02/06/2013  . Anxiety 11/12/2011  . OSA (obstructive sleep apnea) 11/07/2011  . Bradycardia 10/20/2011  . Insomnia 10/04/2011  . Obesity 01/12/2011  . ERECTILE DYSFUNCTION, ORGANIC 05/30/2010  . [place] DISEASE, LUMBAR 04/19/2010  . SCIATICA, LEFT 04/19/2010  . Chronic pain syndrome 10/27/2009  . Hyperlipidemia 07/15/2009  . Essential hypertension 06/24/2009  . CAD (coronary artery disease)  06/24/2009  . Allergic rhinitis 06/24/2009  . URETHRAL STRICTURE 06/24/2009  . DEGENERATIVE JOINT DISEASE 06/24/2009  . SHOULDER PAIN, BILATERAL 06/24/2009  . FATIGUE 06/24/2009  . NEPHROLITHIASIS, HX OF 06/24/2009   Past Medical History:  Diagnosis Date  . ALLERGIC RHINITIS 06/24/2009  . Allergic rhinitis 06/24/2009   Qualifier: Diagnosis of  By: [name], [name]   . Anxiety 11/12/2011   Adequate for discharge   . Arthritis    "all my joints" (09/30/2013)  . Arthritis of foot, right, degenerative 04/15/2014  . Balance disorder 03/12/2013  . Benign neoplasm of cecum   . Benign neoplasm of descending colon   . CAD (coronary artery disease) 06/24/2009   5 stents placed in 2007    . Chronic anticoagulation   . Chronic pain syndrome 10/27/2009   of ankle, shoulders, low back.  sciatica.   . Closed fracture of right foot 10/17/2014  . CORONARY ARTERY DISEASE 06/24/2009   a. s/p multiple PCIs - In 2008 he had a Taxus DES to the mild LAD, Endeavor DES to mid LCX and distal LCX. In January 2009 he had DES to distal LCX, mid LCX and proximal LCX. In November 2009 had BMS x 2 to the mid RCA. Cath 10/2011 with patent stents, noncardiac CP. LHC 01/2013: patent stents (noncardiac CP).  . DEGENERATIVE JOINT DISEASE 06/24/2009   Qualifier: Diagnosis of  By: [name], [name]   . Depression   . Depression with anxiety    Prior suicide attempt  . [place] DISEASE, LUMBAR 04/19/2010  . ERECTILE DYSFUNCTION, ORGANIC 05/30/2010  . Essential hypertension 06/24/2009   Qualifier: Diagnosis of  By: [name], [name]   . Fibromyalgia   . Fracture dislocation of ankle joint 09/02/2015  . Gait disorder 03/12/2013  . General weakness 07/14/2014  . GERD (gastroesophageal reflux disease) 09/08/2015  . Hand joint pain 06/10/2013  . Heart murmur   . Hepatitis C   . History of kidney stones   . Hyperlipidemia 07/15/2009   Qualifier: Diagnosis of  By: Aundra Dubin, MD, [place]    . HYPERLIPIDEMIA-MIXED 07/15/2009  . HYPERTENSION 06/24/2009    . Insomnia 10/04/2011  . Irregular heart beat   . Left hip pain 03/12/2013   Injected under ultrasound guidance on June 24, 2013   . Major depression 09/13/2015  . Myocardial infarction 2008  . Non-cardiac chest pain 10/2011, 01/2013  . Obesity   . Occult blood positive stool 10/17/2014  . Open ankle fracture 09/02/2015  . OSA (obstructive sleep apnea)    not using CPAP (09/30/2013)  . Pain of right thumb 04/03/2013  . Pneumonia   . PPD positive 04/08/2015  . Pre-ulcerative corn or callous 02/06/2013  . Restless legs   . Rotator cuff tear arthropathy of both shoulders 06/10/2013   History of bilateral shoulder cuff surgery for rotator cuff tears. Reports increase in pain 09/11/2015 during physical therapy of the left shoulder.   [name]  SCIATICA, LEFT 04/19/2010   Qualifier: Diagnosis of  By: [name], [name]   . Spinal stenosis in cervical region 09/26/2013  . Spinal stenosis, lumbar region, with neurogenic claudication 09/26/2013  . Type II diabetes mellitus ([place]) 2012   no meds in 09/2014.   [name] Uncontrolled type 2 DM with peripheral circulatory disorder ([place]) 10/04/2013  . URETHRAL STRICTURE 06/24/2009   self catheterizes.     Family History  Problem Relation Age of Onset  . Depression Mother   . Heart disease Mother   . Hypertension Mother   . Cancer Mother     Breast  . Diabetes Father   . Heart disease Father     CABG  . Cancer Father     prostate and skin cancer  . Hypertension Father   . Hyperlipidemia Father   . Depression Brother     x 2  . Hypertension Brother     x2  . Heart disease Maternal Grandfather   . Early death Maternal Grandfather 10    heart attack  . Early death Paternal Grandfather   . Coronary artery disease Other   . Hypertension Other   . Depression Other     Past Surgical History:  Procedure Laterality Date  . AMPUTATION Right 06/14/2016   Procedure: AMPUTATION BELOW KNEE;  Surgeon: Newt Minion, MD;  Location: [facility];  Service: Orthopedics;   Laterality: Right;  . ANKLE FUSION Right 04/15/2014   Procedure: Right Subtalar, Talonavicular Fusion;  Surgeon: Newt Minion, MD;  Location: [facility];  Service: Orthopedics;  Laterality: Right;  . ANKLE FUSION Right 04/18/2016   Right Ankle Tibiocalcaneal Fusion; Removal of deep retained hardware; application of wound vac/notes 04/18/2016  . ANKLE FUSION Right 04/18/2016   Procedure: Right Ankle Tibiocalcaneal Fusion;  Surgeon: Newt Minion, MD;  Location: [facility];  Service: Orthopedics;  Laterality: Right;  . ANTERIOR CERVICAL DECOMP/DISCECTOMY FUSION N/A 09/26/2013   Procedure: ANTERIOR CERVICAL DISCECTOMY FUSION C3-4, plate and screw fixation, allograft bone graft;  Surgeon: Jessy Oto, MD;  Location: [facility];  Service: Orthopedics;  Laterality: N/A;  . BACK SURGERY    . BELOW KNEE LEG AMPUTATION Right 06/14/2016  . CARDIAC CATHETERIZATION  X 1  . CARPAL TUNNEL RELEASE Bilateral   . COLONOSCOPY    . COLONOSCOPY N/A 10/22/2014   Procedure: COLONOSCOPY;  Surgeon: Lafayette Dragon, MD;  [facility] ENDOSCOPY;  Service: Endoscopy;  Laterality: N/A;  . CORONARY ANGIOPLASTY WITH STENT PLACEMENT     "I have 9 stents"  . ESOPHAGOGASTRODUODENOSCOPY N/A 10/19/2014   Procedure: ESOPHAGOGASTRODUODENOSCOPY (EGD);  Surgeon: Jerene Bears, MD;  Location: [facility] ENDOSCOPY;  Service: Endoscopy;  Laterality: N/A;  . FUSION OF TALONAVICULAR JOINT Right 04/15/2014   dr duda  . HERNIA REPAIR     umbilical  . INGUINAL HERNIA REPAIR Right 05/11/2015   Procedure: LAPAROSCOPIC REPAIR RIGHT  INGUINAL HERNIA;  Surgeon: Greer Pickerel, MD;  Location: [facility];  Service: General;  Laterality: Right;  . INSERTION OF MESH Right 05/11/2015   Procedure: INSERTION OF MESH;  Surgeon: Greer Pickerel, MD;  Location: [place] [facility] [place];  Service: General;  Laterality: Right;  . JOINT REPLACEMENT Bilateral    knees  . KNEE CARTILAGE SURGERY Right X 12   "~ 1/2 open; ~ 1/2 scopes"  . KNEE CARTILAGE SURGERY Left X 3   "3 scopes"  . LEFT HEART  CATHETERIZATION WITH CORONARY ANGIOGRAM N/A 02/10/2013   Procedure: LEFT HEART CATHETERIZATION WITH CORONARY ANGIOGRAM;  Surgeon:  Burnell Blanks, MD;  Location[facility] CATH LAB;  Service: Cardiovascular;  Laterality: N/A;  . LUMBAR LAMINECTOMY/DECOMPRESSION MICRODISCECTOMY N/A 01/27/2014   Procedure: CENTRAL LUMBAR LAMINECTOMY L4-5 AND L3-4;  Surgeon: Jessy Oto, MD;  Location: [facility];  Service: Orthopedics;  Laterality: N/A;  . ORIF ANKLE FRACTURE Right 09/02/2015   Procedure: OPEN REDUCTION INTERNAL FIXATION (ORIF) ANKLE FRACTURE;  Surgeon: Newt Minion, MD;  Location: [facility];  Service: Orthopedics;  Laterality: Right;  . PERIPHERALLY INSERTED CENTRAL CATHETER INSERTION  09/02/2015  . SHOULDER ARTHROSCOPY W/ ROTATOR CUFF REPAIR Bilateral    "3 on the right; 1 on the left"  . SKIN SPLIT GRAFT Right 10/01/2015   Procedure: RIGHT ANKLE APPLY SKIN GRAFT SPLIT THICKNESS;  Surgeon: Newt Minion, MD;  Location: [facility];  Service: Orthopedics;  Laterality: Right;  . TONSILLECTOMY    . TOTAL KNEE ARTHROPLASTY Bilateral 2008  . UMBILICAL HERNIA REPAIR     UHR  . URETHRAL DILATION  X 4  . VASECTOMY     Social History   Occupational History  . disabled since 2006 due to ortho. heart, psych Unemployed  . part time work as an Multimedia programmer, wrestling, and baseball Agricultural consultant    Social History Main Topics  . Smoking status: Former Smoker    Types: Cigars    Quit date: 08/28/2010  . Smokeless tobacco: Never Used     Comment: 04/18/2016 "smoked 1 cigar/wk when I did smoke"  . Alcohol use No     Comment: rare (4 beers all summer)  None at all now (03/2014)  . Drug use: No  . Sexual activity: Not Currently

## 2016-07-03 ENCOUNTER — Telehealth (INDEPENDENT_AMBULATORY_CARE_PROVIDER_SITE_OTHER): Payer: Self-pay | Admitting: *Deleted

## 2016-07-03 NOTE — Telephone Encounter (Signed)
See message below and advise.

## 2016-07-03 NOTE — Telephone Encounter (Signed)
Received call from Mardene Celeste at [facility] stating pt recently had BKA and now has redness, no infection, but looks like is separating and has gotten worse over weekend, wants to know if can prescribe antibx until seen 07/05/16 with Dr. Sharol Given. Please advise.  CB# [phone]

## 2016-07-03 NOTE — Telephone Encounter (Signed)
U call have him start doxycycline 100 mg twice a day for 4 weeks.

## 2016-07-04 ENCOUNTER — Other Ambulatory Visit (INDEPENDENT_AMBULATORY_CARE_PROVIDER_SITE_OTHER): Payer: Self-pay

## 2016-07-04 MED ORDER — DOXYCYCLINE HYCLATE 100 MG PO CAPS: 100.0000 mg | ORAL_CAPSULE | Freq: Two times a day (BID) | ORAL | 0 refills | Status: DC

## 2016-07-04 NOTE — Telephone Encounter (Signed)
Called to advise of message below. rx faxed to [phone].

## 2016-07-05 ENCOUNTER — Encounter (INDEPENDENT_AMBULATORY_CARE_PROVIDER_SITE_OTHER): Payer: Self-pay | Admitting: Orthopedic Surgery

## 2016-07-05 ENCOUNTER — Ambulatory Visit (INDEPENDENT_AMBULATORY_CARE_PROVIDER_SITE_OTHER): Payer: Medicare PPO | Admitting: Orthopedic Surgery

## 2016-07-05 VITALS — Ht 73.0 in | Wt 276.0 lb

## 2016-07-05 DIAGNOSIS — IMO0002 Reserved for concepts with insufficient information to code with codable children: Secondary | ICD-10-CM

## 2016-07-05 DIAGNOSIS — Z89511 Acquired absence of right leg below knee: Secondary | ICD-10-CM

## 2016-07-05 NOTE — Progress Notes (Signed)
Office Visit Note   Patient: Travis Rebello Sr.           Date of Birth: [birth year]           MRN: [id number] Visit Date: 07/05/2016              Requested by: [name], MD [place], [place] [address] PCP: [name], MD  Chief Complaint  Patient presents with  . Right Leg - Routine Post Op    06/21/16 right below the knee amputation     HPI: Right BKA. Patient is concerned because there are a few areas along the incision line that are slightly pink and draining a spot amount of bloody drain. The pt does note that the leg is painful. He has a dry dressing covering the site. He is in a SNF for [facility] and states that he participates twice a day. Pamella Pert, RMA    Assessment & Plan: Visit Diagnoses:  1. Below knee amputation status, right ([place])     Plan: Follow up in 1 week for staple removal.  Follow-Up Instructions: Return for staple removal.   Ortho Exam Right below knee amputation is well approximated with staples. Some bleeding from medial incision. No gaping. Centrally has not yet fully healed. Some eschar laterally was debrided manually. No erythema. No purulence. No odor. No sign of infection.  Imaging: No results found.  Orders:  No orders of the defined types were placed in this encounter.  No orders of the defined types were placed in this encounter.    Procedures: No procedures performed  Clinical Data: No additional findings.  Subjective: Review of Systems  Constitutional: Negative for chills and fever.  Cardiovascular: Negative for leg swelling.  Skin: Negative for wound.    Objective: Vital Signs: Ht 6' 1"  (1.854 m)   Wt 276 lb (125.2 kg)   BMI 36.41 kg/m   Specialty Comments:  No specialty comments available.  PMFS History: Patient Active Problem List   Diagnosis Date Noted  . Below knee amputation status, right ([place]) 06/14/2016  . Charcot foot due to diabetes mellitus ([place])   . S/P ankle fusion  04/18/2016  . Charcot's arthropathy associated with type 2 diabetes mellitus ([place]) 04/11/2016  . Mechanical breakdown of internal fixation device of bone of right lower leg ([place]) 04/11/2016  . Encounter for well adult exam with abnormal findings 11/05/2015  . Major depression 09/13/2015  . S/P TKR (total knee replacement) bilaterally 09/13/2015  . GERD (gastroesophageal reflux disease) 09/08/2015  . Open ankle fracture 09/02/2015  . Fracture dislocation of ankle joint 09/02/2015  . S/P laparoscopic hernia repair 05/11/2015  . PPD positive 04/08/2015  . Benign neoplasm of descending colon   . Benign neoplasm of cecum   . AKI (acute kidney injury) ([place]) 10/18/2014  . Acute blood loss anemia   . Chronic anticoagulation   . Closed fracture of right foot 10/17/2014  . Occult blood positive stool 10/17/2014  . General weakness 07/14/2014  . Urinary incontinence 07/14/2014  . Arthritis of foot, right, degenerative 04/15/2014  . Headache(784.0) 10/15/2013  . Spinal stenosis in cervical region 09/26/2013    Class: Chronic  . Spinal stenosis, lumbar region, with neurogenic claudication 09/26/2013    Class: Chronic  . Hand joint pain 06/10/2013  . Rotator cuff tear arthropathy of both shoulders 06/10/2013  . Skin lesion of cheek 05/01/2013  . Pain of right thumb 04/03/2013  . Balance disorder 03/12/2013  .  Gait disorder 03/12/2013  . Tremor 03/12/2013  . Left hip pain 03/12/2013  . Pre-ulcerative corn or callous 02/06/2013  . Anxiety 11/12/2011  . OSA (obstructive sleep apnea) 11/07/2011  . Bradycardia 10/20/2011  . Insomnia 10/04/2011  . Obesity 01/12/2011  . ERECTILE DYSFUNCTION, ORGANIC 05/30/2010  . [place] DISEASE, LUMBAR 04/19/2010  . SCIATICA, LEFT 04/19/2010  . Chronic pain syndrome 10/27/2009  . Hyperlipidemia 07/15/2009  . Essential hypertension 06/24/2009  . CAD (coronary artery disease) 06/24/2009  . Allergic rhinitis 06/24/2009  . URETHRAL STRICTURE 06/24/2009  .  DEGENERATIVE JOINT DISEASE 06/24/2009  . SHOULDER PAIN, BILATERAL 06/24/2009  . FATIGUE 06/24/2009  . NEPHROLITHIASIS, HX OF 06/24/2009   Past Medical History:  Diagnosis Date  . ALLERGIC RHINITIS 06/24/2009  . Allergic rhinitis 06/24/2009   Qualifier: Diagnosis of  By: [name], [name]   . Anxiety 11/12/2011   Adequate for discharge   . Arthritis    "all my joints" (09/30/2013)  . Arthritis of foot, right, degenerative 04/15/2014  . Balance disorder 03/12/2013  . Benign neoplasm of cecum   . Benign neoplasm of descending colon   . CAD (coronary artery disease) 06/24/2009   5 stents placed in 2007    . Chronic anticoagulation   . Chronic pain syndrome 10/27/2009   of ankle, shoulders, low back.  sciatica.   . Closed fracture of right foot 10/17/2014  . CORONARY ARTERY DISEASE 06/24/2009   a. s/p multiple PCIs - In 2008 he had a Taxus DES to the mild LAD, Endeavor DES to mid LCX and distal LCX. In January 2009 he had DES to distal LCX, mid LCX and proximal LCX. In November 2009 had BMS x 2 to the mid RCA. Cath 10/2011 with patent stents, noncardiac CP. LHC 01/2013: patent stents (noncardiac CP).  . DEGENERATIVE JOINT DISEASE 06/24/2009   Qualifier: Diagnosis of  By: [name], [name]   . Depression   . Depression with anxiety    Prior suicide attempt  . [place] DISEASE, LUMBAR 04/19/2010  . ERECTILE DYSFUNCTION, ORGANIC 05/30/2010  . Essential hypertension 06/24/2009   Qualifier: Diagnosis of  By: [name], [name]   . Fibromyalgia   . Fracture dislocation of ankle joint 09/02/2015  . Gait disorder 03/12/2013  . General weakness 07/14/2014  . GERD (gastroesophageal reflux disease) 09/08/2015  . Hand joint pain 06/10/2013  . Heart murmur   . Hepatitis C   . History of kidney stones   . Hyperlipidemia 07/15/2009   Qualifier: Diagnosis of  By: Aundra Dubin, MD, [place]    . HYPERLIPIDEMIA-MIXED 07/15/2009  . HYPERTENSION 06/24/2009  . Insomnia 10/04/2011  . Irregular heart beat   . Left hip pain 03/12/2013    Injected under ultrasound guidance on June 24, 2013   . Major depression 09/13/2015  . Myocardial infarction 2008  . Non-cardiac chest pain 10/2011, 01/2013  . Obesity   . Occult blood positive stool 10/17/2014  . Open ankle fracture 09/02/2015  . OSA (obstructive sleep apnea)    not using CPAP (09/30/2013)  . Pain of right thumb 04/03/2013  . Pneumonia   . PPD positive 04/08/2015  . Pre-ulcerative corn or callous 02/06/2013  . Restless legs   . Rotator cuff tear arthropathy of both shoulders 06/10/2013   History of bilateral shoulder cuff surgery for rotator cuff tears. Reports increase in pain 09/11/2015 during physical therapy of the left shoulder.   [name] SCIATICA, LEFT 04/19/2010   Qualifier: Diagnosis of  By: [name], [name]   .  Spinal stenosis in cervical region 09/26/2013  . Spinal stenosis, lumbar region, with neurogenic claudication 09/26/2013  . Type II diabetes mellitus ([place]) 2012   no meds in 09/2014.   [name] Uncontrolled type 2 DM with peripheral circulatory disorder ([place]) 10/04/2013  . URETHRAL STRICTURE 06/24/2009   self catheterizes.     Family History  Problem Relation Age of Onset  . Depression Mother   . Heart disease Mother   . Hypertension Mother   . Cancer Mother     Breast  . Diabetes Father   . Heart disease Father     CABG  . Cancer Father     prostate and skin cancer  . Hypertension Father   . Hyperlipidemia Father   . Depression Brother     x 2  . Hypertension Brother     x2  . Heart disease Maternal Grandfather   . Early death Maternal Grandfather 5    heart attack  . Early death Paternal Grandfather   . Coronary artery disease Other   . Hypertension Other   . Depression Other     Past Surgical History:  Procedure Laterality Date  . AMPUTATION Right 06/14/2016   Procedure: AMPUTATION BELOW KNEE;  Surgeon: Newt Minion, MD;  Location: [place];  Service: Orthopedics;  Laterality: Right;  . ANKLE FUSION Right 04/15/2014   Procedure: Right Subtalar,  Talonavicular Fusion;  Surgeon: Newt Minion, MD;  Location: [place];  Service: Orthopedics;  Laterality: Right;  . ANKLE FUSION Right 04/18/2016   Right Ankle Tibiocalcaneal Fusion; Removal of deep retained hardware; application of wound vac/notes 04/18/2016  . ANKLE FUSION Right 04/18/2016   Procedure: Right Ankle Tibiocalcaneal Fusion;  Surgeon: Newt Minion, MD;  Location: [facility];  Service: Orthopedics;  Laterality: Right;  . ANTERIOR CERVICAL DECOMP/DISCECTOMY FUSION N/A 09/26/2013   Procedure: ANTERIOR CERVICAL DISCECTOMY FUSION C3-4, plate and screw fixation, allograft bone graft;  Surgeon: Jessy Oto, MD;  Location: [place];  Service: Orthopedics;  Laterality: N/A;  . BACK SURGERY    . BELOW KNEE LEG AMPUTATION Right 06/14/2016  . CARDIAC CATHETERIZATION  X 1  . CARPAL TUNNEL RELEASE Bilateral   . COLONOSCOPY    . COLONOSCOPY N/A 10/22/2014   Procedure: COLONOSCOPY;  Surgeon: Lafayette Dragon, MD;  Location[facility] ENDOSCOPY;  Service: Endoscopy;  Laterality: N/A;  . CORONARY ANGIOPLASTY WITH STENT PLACEMENT     "I have 9 stents"  . ESOPHAGOGASTRODUODENOSCOPY N/A 10/19/2014   Procedure: ESOPHAGOGASTRODUODENOSCOPY (EGD);  Surgeon: Jerene Bears, MD;  Location: [facility] ENDOSCOPY;  Service: Endoscopy;  Laterality: N/A;  . FUSION OF TALONAVICULAR JOINT Right 04/15/2014   dr duda  . HERNIA REPAIR     umbilical  . INGUINAL HERNIA REPAIR Right 05/11/2015   Procedure: LAPAROSCOPIC REPAIR RIGHT  INGUINAL HERNIA;  Surgeon: Greer Pickerel, MD;  Location: [place];  Service: General;  Laterality: Right;  . INSERTION OF MESH Right 05/11/2015   Procedure: INSERTION OF MESH;  Surgeon: Greer Pickerel, MD;  Location: [place];  Service: General;  Laterality: Right;  . JOINT REPLACEMENT Bilateral    knees  . KNEE CARTILAGE SURGERY Right X 12   "~ 1/2 open; ~ 1/2 scopes"  . KNEE CARTILAGE SURGERY Left X 3   "3 scopes"  . LEFT HEART CATHETERIZATION WITH CORONARY ANGIOGRAM N/A 02/10/2013   Procedure: LEFT HEART CATHETERIZATION  WITH CORONARY ANGIOGRAM;  Surgeon: Burnell Blanks, MD;  Location[facility] CATH LAB;  Service: Cardiovascular;  Laterality: N/A;  .  LUMBAR LAMINECTOMY/DECOMPRESSION MICRODISCECTOMY N/A 01/27/2014   Procedure: CENTRAL LUMBAR LAMINECTOMY L4-5 AND L3-4;  Surgeon: Jessy Oto, MD;  Location: [place];  Service: Orthopedics;  Laterality: N/A;  . ORIF ANKLE FRACTURE Right 09/02/2015   Procedure: OPEN REDUCTION INTERNAL FIXATION (ORIF) ANKLE FRACTURE;  Surgeon: Newt Minion, MD;  Location: [place];  Service: Orthopedics;  Laterality: Right;  . PERIPHERALLY INSERTED CENTRAL CATHETER INSERTION  09/02/2015  . SHOULDER ARTHROSCOPY W/ ROTATOR CUFF REPAIR Bilateral    "3 on the right; 1 on the left"  . SKIN SPLIT GRAFT Right 10/01/2015   Procedure: RIGHT ANKLE APPLY SKIN GRAFT SPLIT THICKNESS;  Surgeon: Newt Minion, MD;  Location: [place];  Service: Orthopedics;  Laterality: Right;  . TONSILLECTOMY    . TOTAL KNEE ARTHROPLASTY Bilateral 2008  . UMBILICAL HERNIA REPAIR     UHR  . URETHRAL DILATION  X 4  . VASECTOMY     Social History   Occupational History  . disabled since 2006 due to ortho. heart, psych Unemployed  . part time work as an Multimedia programmer, wrestling, and baseball Agricultural consultant    Social History Main Topics  . Smoking status: Former Smoker    Types: Cigars    Quit date: 08/28/2010  . Smokeless tobacco: Never Used     Comment: 04/18/2016 "smoked 1 cigar/wk when I did smoke"  . Alcohol use No     Comment: rare (4 beers all summer)  None at all now (03/2014)  . Drug use: No  . Sexual activity: Not Currently

## 2016-07-12 ENCOUNTER — Ambulatory Visit (INDEPENDENT_AMBULATORY_CARE_PROVIDER_SITE_OTHER): Payer: Medicare PPO | Admitting: Family

## 2016-07-12 DIAGNOSIS — Z89511 Acquired absence of right leg below knee: Secondary | ICD-10-CM

## 2016-07-12 DIAGNOSIS — IMO0002 Reserved for concepts with insufficient information to code with codable children: Secondary | ICD-10-CM

## 2016-07-12 NOTE — Progress Notes (Signed)
Office Visit Note   Patient: Travis Patlan Sr.           Date of Birth: 30-Oct-1950           MRN: [id number] Visit Date: 07/12/2016              Requested by: [name], MD [place], [place] [address] PCP: [name], MD  No chief complaint on file.   HPI: The patient is a 66 year old gentleman who is seen for weeks status post right below the knee amputation. He states is doing well overall. Continues to have some bleeding central wound. Is wearing a the vIVe compression shrinker XXXL with gauze knees. Level IV prosthetics attending appointment today.    Assessment & Plan: Visit Diagnoses:  1. Below knee amputation status, right ([place])     Plan: Staples harvested. Continue daily wound cleansing. Apply Vive shrinker directly over wound. May pad outside with gauze for drainage. Follow up in office in 2 more weeks.   Follow-Up Instructions: Return in about 2 weeks (around 07/26/2016).   Ortho Exam Incision approximated with staples. Medial and laterally is well healed. Centrally a length of 3 cm has gaped open about 2 mm. This is bleeding today. There is no surrounding erythema. No drainage. No odor. No sign of infection.   Imaging: No results found.  Orders:  No orders of the defined types were placed in this encounter.  No orders of the defined types were placed in this encounter.    Procedures: No procedures performed  Clinical Data: No additional findings.  Subjective: Review of Systems  Constitutional: Negative for chills and fever.    Objective: Vital Signs: There were no vitals taken for this visit.  Specialty Comments:  No specialty comments available.  PMFS History: Patient Active Problem List   Diagnosis Date Noted  . Below knee amputation status, right ([place]) 06/14/2016  . Charcot foot due to diabetes mellitus ([place])   . S/P ankle fusion 04/18/2016  . Charcot's arthropathy associated with type 2 diabetes mellitus ([place])  04/11/2016  . Mechanical breakdown of internal fixation device of bone of right lower leg ([place]) 04/11/2016  . Encounter for well adult exam with abnormal findings 11/05/2015  . Major depression 09/13/2015  . S/P TKR (total knee replacement) bilaterally 09/13/2015  . GERD (gastroesophageal reflux disease) 09/08/2015  . Open ankle fracture 09/02/2015  . Fracture dislocation of ankle joint 09/02/2015  . S/P laparoscopic hernia repair 05/11/2015  . PPD positive 04/08/2015  . Benign neoplasm of descending colon   . Benign neoplasm of cecum   . AKI (acute kidney injury) ([place]) 10/18/2014  . Acute blood loss anemia   . Chronic anticoagulation   . Closed fracture of right foot 10/17/2014  . Occult blood positive stool 10/17/2014  . General weakness 07/14/2014  . Urinary incontinence 07/14/2014  . Arthritis of foot, right, degenerative 04/15/2014  . Headache(784.0) 10/15/2013  . Spinal stenosis in cervical region 09/26/2013    Class: Chronic  . Spinal stenosis, lumbar region, with neurogenic claudication 09/26/2013    Class: Chronic  . Hand joint pain 06/10/2013  . Rotator cuff tear arthropathy of both shoulders 06/10/2013  . Skin lesion of cheek 05/01/2013  . Pain of right thumb 04/03/2013  . Balance disorder 03/12/2013  . Gait disorder 03/12/2013  . Tremor 03/12/2013  . Left hip pain 03/12/2013  . Pre-ulcerative corn or callous 02/06/2013  . Anxiety 11/12/2011  . OSA (obstructive sleep apnea)  11/07/2011  . Bradycardia 10/20/2011  . Insomnia 10/04/2011  . Obesity 01/12/2011  . ERECTILE DYSFUNCTION, ORGANIC 05/30/2010  . [place] DISEASE, LUMBAR 04/19/2010  . SCIATICA, LEFT 04/19/2010  . Chronic pain syndrome 10/27/2009  . Hyperlipidemia 07/15/2009  . Essential hypertension 06/24/2009  . CAD (coronary artery disease) 06/24/2009  . Allergic rhinitis 06/24/2009  . URETHRAL STRICTURE 06/24/2009  . DEGENERATIVE JOINT DISEASE 06/24/2009  . SHOULDER PAIN, BILATERAL 06/24/2009  . FATIGUE  06/24/2009  . NEPHROLITHIASIS, HX OF 06/24/2009   Past Medical History:  Diagnosis Date  . ALLERGIC RHINITIS 06/24/2009  . Allergic rhinitis 06/24/2009   Qualifier: Diagnosis of  By: [name], [name]   . Anxiety 11/12/2011   Adequate for discharge   . Arthritis    "all my joints" (09/30/2013)  . Arthritis of foot, right, degenerative 04/15/2014  . Balance disorder 03/12/2013  . Benign neoplasm of cecum   . Benign neoplasm of descending colon   . CAD (coronary artery disease) 06/24/2009   5 stents placed in 2007    . Chronic anticoagulation   . Chronic pain syndrome 10/27/2009   of ankle, shoulders, low back.  sciatica.   . Closed fracture of right foot 10/17/2014  . CORONARY ARTERY DISEASE 06/24/2009   a. s/p multiple PCIs - In 2008 he had a Taxus DES to the mild LAD, Endeavor DES to mid LCX and distal LCX. In January 2009 he had DES to distal LCX, mid LCX and proximal LCX. In November 2009 had BMS x 2 to the mid RCA. Cath 10/2011 with patent stents, noncardiac CP. LHC 01/2013: patent stents (noncardiac CP).  . DEGENERATIVE JOINT DISEASE 06/24/2009   Qualifier: Diagnosis of  By: [name], [name]   . Depression   . Depression with anxiety    Prior suicide attempt  . [place] DISEASE, LUMBAR 04/19/2010  . ERECTILE DYSFUNCTION, ORGANIC 05/30/2010  . Essential hypertension 06/24/2009   Qualifier: Diagnosis of  By: [name], [name]   . Fibromyalgia   . Fracture dislocation of ankle joint 09/02/2015  . Gait disorder 03/12/2013  . General weakness 07/14/2014  . GERD (gastroesophageal reflux disease) 09/08/2015  . Hand joint pain 06/10/2013  . Heart murmur   . Hepatitis C   . History of kidney stones   . Hyperlipidemia 07/15/2009   Qualifier: Diagnosis of  By: Aundra Dubin, MD, [place]    . HYPERLIPIDEMIA-MIXED 07/15/2009  . HYPERTENSION 06/24/2009  . Insomnia 10/04/2011  . Irregular heart beat   . Left hip pain 03/12/2013   Injected under ultrasound guidance on June 24, 2013   . Major depression 09/13/2015  .  Myocardial infarction 2008  . Non-cardiac chest pain 10/2011, 01/2013  . Obesity   . Occult blood positive stool 10/17/2014  . Open ankle fracture 09/02/2015  . OSA (obstructive sleep apnea)    not using CPAP (09/30/2013)  . Pain of right thumb 04/03/2013  . Pneumonia   . PPD positive 04/08/2015  . Pre-ulcerative corn or callous 02/06/2013  . Restless legs   . Rotator cuff tear arthropathy of both shoulders 06/10/2013   History of bilateral shoulder cuff surgery for rotator cuff tears. Reports increase in pain 09/11/2015 during physical therapy of the left shoulder.   [name] SCIATICA, LEFT 04/19/2010   Qualifier: Diagnosis of  By: [name], [name]   . Spinal stenosis in cervical region 09/26/2013  . Spinal stenosis, lumbar region, with neurogenic claudication 09/26/2013  . Type II diabetes mellitus ([place]) 2012   no  meds in 09/2014.   [name] Uncontrolled type 2 DM with peripheral circulatory disorder ([place]) 10/04/2013  . URETHRAL STRICTURE 06/24/2009   self catheterizes.     Family History  Problem Relation Age of Onset  . Depression Mother   . Heart disease Mother   . Hypertension Mother   . Cancer Mother     Breast  . Diabetes Father   . Heart disease Father     CABG  . Cancer Father     prostate and skin cancer  . Hypertension Father   . Hyperlipidemia Father   . Depression Brother     x 2  . Hypertension Brother     x2  . Heart disease Maternal Grandfather   . Early death Maternal Grandfather 61    heart attack  . Early death Paternal Grandfather   . Coronary artery disease Other   . Hypertension Other   . Depression Other     Past Surgical History:  Procedure Laterality Date  . AMPUTATION Right 06/14/2016   Procedure: AMPUTATION BELOW KNEE;  Surgeon: Newt Minion, MD;  Location: [place];  Service: Orthopedics;  Laterality: Right;  . ANKLE FUSION Right 04/15/2014   Procedure: Right Subtalar, Talonavicular Fusion;  Surgeon: Newt Minion, MD;  Location: [place];  Service: Orthopedics;   Laterality: Right;  . ANKLE FUSION Right 04/18/2016   Right Ankle Tibiocalcaneal Fusion; Removal of deep retained hardware; application of wound vac/notes 04/18/2016  . ANKLE FUSION Right 04/18/2016   Procedure: Right Ankle Tibiocalcaneal Fusion;  Surgeon: Newt Minion, MD;  Location: [place];  Service: Orthopedics;  Laterality: Right;  . ANTERIOR CERVICAL DECOMP/DISCECTOMY FUSION N/A 09/26/2013   Procedure: ANTERIOR CERVICAL DISCECTOMY FUSION C3-4, plate and screw fixation, allograft bone graft;  Surgeon: Jessy Oto, MD;  Location: [place];  Service: Orthopedics;  Laterality: N/A;  . BACK SURGERY    . BELOW KNEE LEG AMPUTATION Right 06/14/2016  . CARDIAC CATHETERIZATION  X 1  . CARPAL TUNNEL RELEASE Bilateral   . COLONOSCOPY    . COLONOSCOPY N/A 10/22/2014   Procedure: COLONOSCOPY;  Surgeon: Lafayette Dragon, MD;  Location[facility] ENDOSCOPY;  Service: Endoscopy;  Laterality: N/A;  . CORONARY ANGIOPLASTY WITH STENT PLACEMENT     "I have 9 stents"  . ESOPHAGOGASTRODUODENOSCOPY N/A 10/19/2014   Procedure: ESOPHAGOGASTRODUODENOSCOPY (EGD);  Surgeon: Jerene Bears, MD;  Location: [facility] ENDOSCOPY;  Service: Endoscopy;  Laterality: N/A;  . FUSION OF TALONAVICULAR JOINT Right 04/15/2014   dr duda  . HERNIA REPAIR     umbilical  . INGUINAL HERNIA REPAIR Right 05/11/2015   Procedure: LAPAROSCOPIC REPAIR RIGHT  INGUINAL HERNIA;  Surgeon: Greer Pickerel, MD;  Location: [place];  Service: General;  Laterality: Right;  . INSERTION OF MESH Right 05/11/2015   Procedure: INSERTION OF MESH;  Surgeon: Greer Pickerel, MD;  Location: [place];  Service: General;  Laterality: Right;  . JOINT REPLACEMENT Bilateral    knees  . KNEE CARTILAGE SURGERY Right X 12   "~ 1/2 open; ~ 1/2 scopes"  . KNEE CARTILAGE SURGERY Left X 3   "3 scopes"  . LEFT HEART CATHETERIZATION WITH CORONARY ANGIOGRAM N/A 02/10/2013   Procedure: LEFT HEART CATHETERIZATION WITH CORONARY ANGIOGRAM;  Surgeon: Burnell Blanks, MD;  Location: [facility] CATH LAB;   Service: Cardiovascular;  Laterality: N/A;  . LUMBAR LAMINECTOMY/DECOMPRESSION MICRODISCECTOMY N/A 01/27/2014   Procedure: CENTRAL LUMBAR LAMINECTOMY L4-5 AND L3-4;  Surgeon: Jessy Oto, MD;  Location: [place];  Service: Orthopedics;  Laterality: N/A;  . ORIF ANKLE FRACTURE Right 09/02/2015   Procedure: OPEN REDUCTION INTERNAL FIXATION (ORIF) ANKLE FRACTURE;  Surgeon: Newt Minion, MD;  Location: [place];  Service: Orthopedics;  Laterality: Right;  . PERIPHERALLY INSERTED CENTRAL CATHETER INSERTION  09/02/2015  . SHOULDER ARTHROSCOPY W/ ROTATOR CUFF REPAIR Bilateral    "3 on the right; 1 on the left"  . SKIN SPLIT GRAFT Right 10/01/2015   Procedure: RIGHT ANKLE APPLY SKIN GRAFT SPLIT THICKNESS;  Surgeon: Newt Minion, MD;  Location: [place];  Service: Orthopedics;  Laterality: Right;  . TONSILLECTOMY    . TOTAL KNEE ARTHROPLASTY Bilateral 2008  . UMBILICAL HERNIA REPAIR     UHR  . URETHRAL DILATION  X 4  . VASECTOMY     Social History   Occupational History  . disabled since 2006 due to ortho. heart, psych Unemployed  . part time work as an Multimedia programmer, wrestling, and baseball Agricultural consultant    Social History Main Topics  . Smoking status: Former Smoker    Types: Cigars    Quit date: 08/28/2010  . Smokeless tobacco: Never Used     Comment: 04/18/2016 "smoked 1 cigar/wk when I did smoke"  . Alcohol use No     Comment: rare (4 beers all summer)  None at all now (03/2014)  . Drug use: No  . Sexual activity: Not Currently

## 2016-07-13 ENCOUNTER — Ambulatory Visit (INDEPENDENT_AMBULATORY_CARE_PROVIDER_SITE_OTHER): Payer: Medicare PPO | Admitting: Orthopedic Surgery

## 2016-07-13 ENCOUNTER — Encounter (INDEPENDENT_AMBULATORY_CARE_PROVIDER_SITE_OTHER): Payer: Self-pay | Admitting: Orthopedic Surgery

## 2016-07-13 VITALS — Ht 73.0 in | Wt 276.0 lb

## 2016-07-13 DIAGNOSIS — Z89511 Acquired absence of right leg below knee: Secondary | ICD-10-CM

## 2016-07-13 NOTE — Progress Notes (Signed)
Office Visit Note   Patient: Travis Grosso Sr.           Date of Birth: [birth year]           MRN: [id number] Visit Date: 07/13/2016              Requested by: [name], MD [address] [place], [place] [address] PCP: [name], MD  Chief Complaint  Patient presents with  . Right Knee - Wound Check    AMPUTATION BELOW KNEE - Right 06/14/16    HPI: Patient is a 66 y.o male who presents today for incisional wound check. He is status post right transtibial amputation. He was in the office yesterday and staples were removed. He states today he has had increase in bleeding. Dry dressing removed. Maxcine Ham, RT    Assessment & Plan: Visit Diagnoses: No diagnosis found.  Plan: Patient had a medical compression stocking applied to his residual limb at the site was applied this was comfortable. Patient will have this changed daily with dialysis of cleansing. There is no signs of infection at this time patient will follow up routinely in 2 weeks. Anticipate he can be fitted for prosthesis in about 4 weeks. If patient has any signs or symptoms of infection he will call and follow up immediately.  Follow-Up Instructions: No Follow-up on file.   Ortho Exam Examination the wound is gaped open a little the wound is about 4 cm in length and 0.1 mm deep with healthy beefy granulation tissue there is no cellulitis no odor no signs of infection. There is a very small amount of serosanguineous drainage from lack of compression. Patient was seen with his O&P from level 4  Imaging: No results found.  Orders:  No orders of the defined types were placed in this encounter.  No orders of the defined types were placed in this encounter.    Procedures: No procedures performed  Clinical Data: No additional findings.  Subjective: Review of Systems  Objective: Vital Signs: There were no vitals taken for this visit.  Specialty Comments:  No specialty comments  available.  PMFS History: Patient Active Problem List   Diagnosis Date Noted  . Below knee amputation status, right ([place]) 06/14/2016  . Charcot foot due to diabetes mellitus ([place])   . S/P ankle fusion 04/18/2016  . Charcot's arthropathy associated with type 2 diabetes mellitus ([place]) 04/11/2016  . Mechanical breakdown of internal fixation device of bone of right lower leg ([place]) 04/11/2016  . Encounter for well adult exam with abnormal findings 11/05/2015  . Major depression 09/13/2015  . S/P TKR (total knee replacement) bilaterally 09/13/2015  . GERD (gastroesophageal reflux disease) 09/08/2015  . Open ankle fracture 09/02/2015  . Fracture dislocation of ankle joint 09/02/2015  . S/P laparoscopic hernia repair 05/11/2015  . PPD positive 04/08/2015  . Benign neoplasm of descending colon   . Benign neoplasm of cecum   . AKI (acute kidney injury) ([place]) 10/18/2014  . Acute blood loss anemia   . Chronic anticoagulation   . Closed fracture of right foot 10/17/2014  . Occult blood positive stool 10/17/2014  . General weakness 07/14/2014  . Urinary incontinence 07/14/2014  . Arthritis of foot, right, degenerative 04/15/2014  . Headache(784.0) 10/15/2013  . Spinal stenosis in cervical region 09/26/2013    Class: Chronic  . Spinal stenosis, lumbar region, with neurogenic claudication 09/26/2013    Class: Chronic  . Hand joint pain 06/10/2013  . Rotator  cuff tear arthropathy of both shoulders 06/10/2013  . Skin lesion of cheek 05/01/2013  . Pain of right thumb 04/03/2013  . Balance disorder 03/12/2013  . Gait disorder 03/12/2013  . Tremor 03/12/2013  . Left hip pain 03/12/2013  . Pre-ulcerative corn or callous 02/06/2013  . Anxiety 11/12/2011  . OSA (obstructive sleep apnea) 11/07/2011  . Bradycardia 10/20/2011  . Insomnia 10/04/2011  . Obesity 01/12/2011  . ERECTILE DYSFUNCTION, ORGANIC 05/30/2010  . [place] DISEASE, LUMBAR 04/19/2010  . SCIATICA, LEFT 04/19/2010  . Chronic pain  syndrome 10/27/2009  . Hyperlipidemia 07/15/2009  . Essential hypertension 06/24/2009  . CAD (coronary artery disease) 06/24/2009  . Allergic rhinitis 06/24/2009  . URETHRAL STRICTURE 06/24/2009  . DEGENERATIVE JOINT DISEASE 06/24/2009  . SHOULDER PAIN, BILATERAL 06/24/2009  . FATIGUE 06/24/2009  . NEPHROLITHIASIS, HX OF 06/24/2009   Past Medical History:  Diagnosis Date  . ALLERGIC RHINITIS 06/24/2009  . Allergic rhinitis 06/24/2009   Qualifier: Diagnosis of  By: [name], [name]   . Anxiety 11/12/2011   Adequate for discharge   . Arthritis    "all my joints" (09/30/2013)  . Arthritis of foot, right, degenerative 04/15/2014  . Balance disorder 03/12/2013  . Benign neoplasm of cecum   . Benign neoplasm of descending colon   . CAD (coronary artery disease) 06/24/2009   5 stents placed in 2007    . Chronic anticoagulation   . Chronic pain syndrome 10/27/2009   of ankle, shoulders, low back.  sciatica.   . Closed fracture of right foot 10/17/2014  . CORONARY ARTERY DISEASE 06/24/2009   a. s/p multiple PCIs - In 2008 he had a Taxus DES to the mild LAD, Endeavor DES to mid LCX and distal LCX. In January 2009 he had DES to distal LCX, mid LCX and proximal LCX. In November 2009 had BMS x 2 to the mid RCA. Cath 10/2011 with patent stents, noncardiac CP. LHC 01/2013: patent stents (noncardiac CP).  . DEGENERATIVE JOINT DISEASE 06/24/2009   Qualifier: Diagnosis of  By: [name], [name]   . Depression   . Depression with anxiety    Prior suicide attempt  . [place] DISEASE, LUMBAR 04/19/2010  . ERECTILE DYSFUNCTION, ORGANIC 05/30/2010  . Essential hypertension 06/24/2009   Qualifier: Diagnosis of  By: [name], [name]   . Fibromyalgia   . Fracture dislocation of ankle joint 09/02/2015  . Gait disorder 03/12/2013  . General weakness 07/14/2014  . GERD (gastroesophageal reflux disease) 09/08/2015  . Hand joint pain 06/10/2013  . Heart murmur   . Hepatitis C   . History of kidney stones   . Hyperlipidemia  07/15/2009   Qualifier: Diagnosis of  By: Aundra Dubin, MD, [place]    . HYPERLIPIDEMIA-MIXED 07/15/2009  . HYPERTENSION 06/24/2009  . Insomnia 10/04/2011  . Irregular heart beat   . Left hip pain 03/12/2013   Injected under ultrasound guidance on June 24, 2013   . Major depression 09/13/2015  . Myocardial infarction 2008  . Non-cardiac chest pain 10/2011, 01/2013  . Obesity   . Occult blood positive stool 10/17/2014  . Open ankle fracture 09/02/2015  . OSA (obstructive sleep apnea)    not using CPAP (09/30/2013)  . Pain of right thumb 04/03/2013  . Pneumonia   . PPD positive 04/08/2015  . Pre-ulcerative corn or callous 02/06/2013  . Restless legs   . Rotator cuff tear arthropathy of both shoulders 06/10/2013   History of bilateral shoulder cuff surgery for rotator cuff tears. Reports  increase in pain 09/11/2015 during physical therapy of the left shoulder.   [name] SCIATICA, LEFT 04/19/2010   Qualifier: Diagnosis of  By: [name], [name]   . Spinal stenosis in cervical region 09/26/2013  . Spinal stenosis, lumbar region, with neurogenic claudication 09/26/2013  . Type II diabetes mellitus ([place]) 2012   no meds in 09/2014.   [name] Uncontrolled type 2 DM with peripheral circulatory disorder ([place]) 10/04/2013  . URETHRAL STRICTURE 06/24/2009   self catheterizes.     Family History  Problem Relation Age of Onset  . Depression Mother   . Heart disease Mother   . Hypertension Mother   . Cancer Mother     Breast  . Diabetes Father   . Heart disease Father     CABG  . Cancer Father     prostate and skin cancer  . Hypertension Father   . Hyperlipidemia Father   . Depression Brother     x 2  . Hypertension Brother     x2  . Heart disease Maternal Grandfather   . Early death Maternal Grandfather 65    heart attack  . Early death Paternal Grandfather   . Coronary artery disease Other   . Hypertension Other   . Depression Other     Past Surgical History:  Procedure Laterality Date  . AMPUTATION Right  06/14/2016   Procedure: AMPUTATION BELOW KNEE;  Surgeon: Newt Minion, MD;  Location: [facility];  Service: Orthopedics;  Laterality: Right;  . ANKLE FUSION Right 04/15/2014   Procedure: Right Subtalar, Talonavicular Fusion;  Surgeon: Newt Minion, MD;  Location: [facility];  Service: Orthopedics;  Laterality: Right;  . ANKLE FUSION Right 04/18/2016   Right Ankle Tibiocalcaneal Fusion; Removal of deep retained hardware; application of wound vac/notes 04/18/2016  . ANKLE FUSION Right 04/18/2016   Procedure: Right Ankle Tibiocalcaneal Fusion;  Surgeon: Newt Minion, MD;  Location: [facility];  Service: Orthopedics;  Laterality: Right;  . ANTERIOR CERVICAL DECOMP/DISCECTOMY FUSION N/A 09/26/2013   Procedure: ANTERIOR CERVICAL DISCECTOMY FUSION C3-4, plate and screw fixation, allograft bone graft;  Surgeon: Jessy Oto, MD;  Location: [facility];  Service: Orthopedics;  Laterality: N/A;  . BACK SURGERY    . BELOW KNEE LEG AMPUTATION Right 06/14/2016  . CARDIAC CATHETERIZATION  X 1  . CARPAL TUNNEL RELEASE Bilateral   . COLONOSCOPY    . COLONOSCOPY N/A 10/22/2014   Procedure: COLONOSCOPY;  Surgeon: Lafayette Dragon, MD;  Location: [facility] ENDOSCOPY;  Service: Endoscopy;  Laterality: N/A;  . CORONARY ANGIOPLASTY WITH STENT PLACEMENT     "I have 9 stents"  . ESOPHAGOGASTRODUODENOSCOPY N/A 10/19/2014   Procedure: ESOPHAGOGASTRODUODENOSCOPY (EGD);  Surgeon: Jerene Bears, MD;  Location: [facility] ENDOSCOPY;  Service: Endoscopy;  Laterality: N/A;  . FUSION OF TALONAVICULAR JOINT Right 04/15/2014   dr Lasheena Frieze  . HERNIA REPAIR     umbilical  . INGUINAL HERNIA REPAIR Right 05/11/2015   Procedure: LAPAROSCOPIC REPAIR RIGHT  INGUINAL HERNIA;  Surgeon: Greer Pickerel, MD;  Location: [facility];  Service: General;  Laterality: Right;  . INSERTION OF MESH Right 05/11/2015   Procedure: INSERTION OF MESH;  Surgeon: Greer Pickerel, MD;  Location: [facility];  Service: General;  Laterality: Right;  . JOINT REPLACEMENT Bilateral    knees  . KNEE CARTILAGE SURGERY  Right X 12   "~ 1/2 open; ~ 1/2 scopes"  . KNEE CARTILAGE SURGERY Left X 3   "3 scopes"  . LEFT HEART CATHETERIZATION WITH CORONARY  ANGIOGRAM N/A 02/10/2013   Procedure: LEFT HEART CATHETERIZATION WITH CORONARY ANGIOGRAM;  Surgeon: Burnell Blanks, MD;  Location: [facility] CATH LAB;  Service: Cardiovascular;  Laterality: N/A;  . LUMBAR LAMINECTOMY/DECOMPRESSION MICRODISCECTOMY N/A 01/27/2014   Procedure: CENTRAL LUMBAR LAMINECTOMY L4-5 AND L3-4;  Surgeon: Jessy Oto, MD;  Location: [facility];  Service: Orthopedics;  Laterality: N/A;  . ORIF ANKLE FRACTURE Right 09/02/2015   Procedure: OPEN REDUCTION INTERNAL FIXATION (ORIF) ANKLE FRACTURE;  Surgeon: Newt Minion, MD;  Location: [facility];  Service: Orthopedics;  Laterality: Right;  . PERIPHERALLY INSERTED CENTRAL CATHETER INSERTION  09/02/2015  . SHOULDER ARTHROSCOPY W/ ROTATOR CUFF REPAIR Bilateral    "3 on the right; 1 on the left"  . SKIN SPLIT GRAFT Right 10/01/2015   Procedure: RIGHT ANKLE APPLY SKIN GRAFT SPLIT THICKNESS;  Surgeon: Newt Minion, MD;  Location: [facility];  Service: Orthopedics;  Laterality: Right;  . TONSILLECTOMY    . TOTAL KNEE ARTHROPLASTY Bilateral 2008  . UMBILICAL HERNIA REPAIR     UHR  . URETHRAL DILATION  X 4  . VASECTOMY     Social History   Occupational History  . disabled since 2006 due to ortho. heart, psych Unemployed  . part time work as an Multimedia programmer, wrestling, and baseball Agricultural consultant    Social History Main Topics  . Smoking status: Former Smoker    Types: Cigars    Quit date: 08/28/2010  . Smokeless tobacco: Never Used     Comment: 04/18/2016 "smoked 1 cigar/wk when I did smoke"  . Alcohol use No     Comment: rare (4 beers all summer)  None at all now (03/2014)  . Drug use: No  . Sexual activity: Not Currently

## 2016-07-27 ENCOUNTER — Encounter (INDEPENDENT_AMBULATORY_CARE_PROVIDER_SITE_OTHER): Payer: Self-pay | Admitting: Orthopedic Surgery

## 2016-07-27 ENCOUNTER — Ambulatory Visit (INDEPENDENT_AMBULATORY_CARE_PROVIDER_SITE_OTHER): Payer: Medicare PPO | Admitting: Orthopedic Surgery

## 2016-07-27 VITALS — Ht 73.0 in | Wt 276.0 lb

## 2016-07-27 DIAGNOSIS — Z89511 Acquired absence of right leg below knee: Secondary | ICD-10-CM

## 2016-07-27 NOTE — Progress Notes (Signed)
Office Visit Note   Patient: Travis Ohms Sr.           Date of Birth: [birth year]           MRN: [id number] Visit Date: 07/27/2016              Requested by: [name], MD [address] [place], [place] [id number] PCP: [name], MD  Chief Complaint  Patient presents with  . Right Leg - Routine Post Op    06/14/16 right below the knee amputation.     HPI: The pt is wearing a Vive compression sock onhis right BKA. The SNF has a dressing underneath that does not allow for direct skin contact. There are two pinpoint openings at the incision and another small area at the lateral side of the incision. There is spot amount of pink drain. The SNF is applying silvercell and a dry dressing to these areas. Pamella Pert, RMA    Assessment & Plan: Visit Diagnoses: No diagnosis found.  Plan: Continue with daily wound cleansing. Apply Vive shrinker with direct skin contact. Follow with Level 4 for fabrication of prosthesis. Have provided an order today. Will make great K3 ambulator.   Follow-Up Instructions: No Follow-up on file.   Ortho Exam Right bKA healing well. consolidatating well. Laterally there is an open area that is 2 cm x 1 cm. With 3 other 5 mm in diameter open areas. Wound have no depth. Bleeding granulationtissue. No erythema, odor or purulence. No sign o finfection.  Imaging: No results found.  Labs: Lab Results  Component Value Date   HGBA1C 5.3 04/18/2016   HGBA1C 5.2 11/10/2015   HGBA1C 5.4 03/12/2015   ESRSEDRATE 20 (H) 09/03/2013   ESRSEDRATE 11 03/25/2013   CRP 10.7 (H) 09/03/2013   REPTSTATUS 01/30/2014 FINAL 01/29/2014   GRAMSTAIN  09/02/2013    WBC PRESENT, PREDOMINANTLY MONONUCLEAR NO ORGANISMS SEEN CYTOSPIN Performed by [facility] Performed at [facility]  09/02/2013    NO ORGANISMS SEEN WBC PRESENT, PREDOMINANTLY MONONUCLEAR CYTOSPIN Gram Stain Report Called to,Read Back By and Verified With: A LEDWELL  RN 1933 09/02/13 A NAVARRO   CULT NO GROWTH Performed at [facility] 01/29/2014   LABORGA STAPHYLOCOCCUS SPECIES (COAGULASE NEGATIVE) 04/08/2015    Orders:  No orders of the defined types were placed in this encounter.  No orders of the defined types were placed in this encounter.    Procedures: No procedures performed  Clinical Data: No additional findings.  Subjective: Review of Systems  Constitutional: Negative for chills and fever.    Objective: Vital Signs: Ht 6' 1"  (2.831 m)   Wt 276 lb (125.2 kg)   BMI 36.41 kg/m   Specialty Comments:  No specialty comments available.  PMFS History: Patient Active Problem List   Diagnosis Date Noted  . S/P unilateral BKA (below knee amputation), right ([place]) 06/14/2016  . Charcot foot due to diabetes mellitus ([place])   . S/P ankle fusion 04/18/2016  . Charcot's arthropathy associated with type 2 diabetes mellitus ([place]) 04/11/2016  . Mechanical breakdown of internal fixation device of bone of right lower leg ([place]) 04/11/2016  . Encounter for well adult exam with abnormal findings 11/05/2015  . Major depression 09/13/2015  . S/P TKR (total knee replacement) bilaterally 09/13/2015  . GERD (gastroesophageal reflux disease) 09/08/2015  . Open ankle fracture 09/02/2015  . Fracture dislocation of ankle joint 09/02/2015  . S/P laparoscopic hernia repair 05/11/2015  .  PPD positive 04/08/2015  . Benign neoplasm of descending colon   . Benign neoplasm of cecum   . AKI (acute kidney injury) ([place]) 10/18/2014  . Acute blood loss anemia   . Chronic anticoagulation   . Closed fracture of right foot 10/17/2014  . Occult blood positive stool 10/17/2014  . General weakness 07/14/2014  . Urinary incontinence 07/14/2014  . Arthritis of foot, right, degenerative 04/15/2014  . Headache(784.0) 10/15/2013  . Spinal stenosis in cervical region 09/26/2013    Class: Chronic  . Spinal stenosis, lumbar region, with neurogenic claudication  09/26/2013    Class: Chronic  . Hand joint pain 06/10/2013  . Rotator cuff tear arthropathy of both shoulders 06/10/2013  . Skin lesion of cheek 05/01/2013  . Pain of right thumb 04/03/2013  . Balance disorder 03/12/2013  . Gait disorder 03/12/2013  . Tremor 03/12/2013  . Left hip pain 03/12/2013  . Pre-ulcerative corn or callous 02/06/2013  . Anxiety 11/12/2011  . OSA (obstructive sleep apnea) 11/07/2011  . Bradycardia 10/20/2011  . Insomnia 10/04/2011  . Obesity 01/12/2011  . ERECTILE DYSFUNCTION, ORGANIC 05/30/2010  . [place] DISEASE, LUMBAR 04/19/2010  . SCIATICA, LEFT 04/19/2010  . Chronic pain syndrome 10/27/2009  . Hyperlipidemia 07/15/2009  . Essential hypertension 06/24/2009  . CAD (coronary artery disease) 06/24/2009  . Allergic rhinitis 06/24/2009  . URETHRAL STRICTURE 06/24/2009  . DEGENERATIVE JOINT DISEASE 06/24/2009  . SHOULDER PAIN, BILATERAL 06/24/2009  . FATIGUE 06/24/2009  . NEPHROLITHIASIS, HX OF 06/24/2009   Past Medical History:  Diagnosis Date  . ALLERGIC RHINITIS 06/24/2009  . Allergic rhinitis 06/24/2009   Qualifier: Diagnosis of  By: [name], [name]   . Anxiety 11/12/2011   Adequate for discharge   . Arthritis    "all my joints" (09/30/2013)  . Arthritis of foot, right, degenerative 04/15/2014  . Balance disorder 03/12/2013  . Benign neoplasm of cecum   . Benign neoplasm of descending colon   . CAD (coronary artery disease) 06/24/2009   5 stents placed in 2007    . Chronic anticoagulation   . Chronic pain syndrome 10/27/2009   of ankle, shoulders, low back.  sciatica.   . Closed fracture of right foot 10/17/2014  . CORONARY ARTERY DISEASE 06/24/2009   a. s/p multiple PCIs - In 2008 he had a Taxus DES to the mild LAD, Endeavor DES to mid LCX and distal LCX. In January 2009 he had DES to distal LCX, mid LCX and proximal LCX. In November 2009 had BMS x 2 to the mid RCA. Cath 10/2011 with patent stents, noncardiac CP. LHC 01/2013: patent stents (noncardiac CP).    . DEGENERATIVE JOINT DISEASE 06/24/2009   Qualifier: Diagnosis of  By: [name], [name]   . Depression   . Depression with anxiety    Prior suicide attempt  . [place] DISEASE, LUMBAR 04/19/2010  . ERECTILE DYSFUNCTION, ORGANIC 05/30/2010  . Essential hypertension 06/24/2009   Qualifier: Diagnosis of  By: [name], [name]   . Fibromyalgia   . Fracture dislocation of ankle joint 09/02/2015  . Gait disorder 03/12/2013  . General weakness 07/14/2014  . GERD (gastroesophageal reflux disease) 09/08/2015  . Hand joint pain 06/10/2013  . Heart murmur   . Hepatitis C   . History of kidney stones   . Hyperlipidemia 07/15/2009   Qualifier: Diagnosis of  By: Aundra Dubin, MD, [place]    . HYPERLIPIDEMIA-MIXED 07/15/2009  . HYPERTENSION 06/24/2009  . Insomnia 10/04/2011  . Irregular heart beat   .  Left hip pain 03/12/2013   Injected under ultrasound guidance on June 24, 2013   . Major depression 09/13/2015  . Myocardial infarction 2008  . Non-cardiac chest pain 10/2011, 01/2013  . Obesity   . Occult blood positive stool 10/17/2014  . Open ankle fracture 09/02/2015  . OSA (obstructive sleep apnea)    not using CPAP (09/30/2013)  . Pain of right thumb 04/03/2013  . Pneumonia   . PPD positive 04/08/2015  . Pre-ulcerative corn or callous 02/06/2013  . Restless legs   . Rotator cuff tear arthropathy of both shoulders 06/10/2013   History of bilateral shoulder cuff surgery for rotator cuff tears. Reports increase in pain 09/11/2015 during physical therapy of the left shoulder.   [name] SCIATICA, LEFT 04/19/2010   Qualifier: Diagnosis of  By: [name], [name]   . Spinal stenosis in cervical region 09/26/2013  . Spinal stenosis, lumbar region, with neurogenic claudication 09/26/2013  . Type II diabetes mellitus ([place]) 2012   no meds in 09/2014.   [name] Uncontrolled type 2 DM with peripheral circulatory disorder ([place]) 10/04/2013  . URETHRAL STRICTURE 06/24/2009   self catheterizes.     Family History  Problem Relation Age of Onset   . Depression Mother   . Heart disease Mother   . Hypertension Mother   . Cancer Mother     Breast  . Diabetes Father   . Heart disease Father     CABG  . Cancer Father     prostate and skin cancer  . Hypertension Father   . Hyperlipidemia Father   . Depression Brother     x 2  . Hypertension Brother     x2  . Heart disease Maternal Grandfather   . Early death Maternal Grandfather 94    heart attack  . Early death Paternal Grandfather   . Coronary artery disease Other   . Hypertension Other   . Depression Other     Past Surgical History:  Procedure Laterality Date  . AMPUTATION Right 06/14/2016   Procedure: AMPUTATION BELOW KNEE;  Surgeon: Newt Minion, MD;  Location: [facility];  Service: Orthopedics;  Laterality: Right;  . ANKLE FUSION Right 04/15/2014   Procedure: Right Subtalar, Talonavicular Fusion;  Surgeon: Newt Minion, MD;  Location: [facility];  Service: Orthopedics;  Laterality: Right;  . ANKLE FUSION Right 04/18/2016   Right Ankle Tibiocalcaneal Fusion; Removal of deep retained hardware; application of wound vac/notes 04/18/2016  . ANKLE FUSION Right 04/18/2016   Procedure: Right Ankle Tibiocalcaneal Fusion;  Surgeon: Newt Minion, MD;  Location: [facility];  Service: Orthopedics;  Laterality: Right;  . ANTERIOR CERVICAL DECOMP/DISCECTOMY FUSION N/A 09/26/2013   Procedure: ANTERIOR CERVICAL DISCECTOMY FUSION C3-4, plate and screw fixation, allograft bone graft;  Surgeon: Jessy Oto, MD;  Location: [facility];  Service: Orthopedics;  Laterality: N/A;  . BACK SURGERY    . BELOW KNEE LEG AMPUTATION Right 06/14/2016  . CARDIAC CATHETERIZATION  X 1  . CARPAL TUNNEL RELEASE Bilateral   . COLONOSCOPY    . COLONOSCOPY N/A 10/22/2014   Procedure: COLONOSCOPY;  Surgeon: Lafayette Dragon, MD;  Location: [facility] ENDOSCOPY;  Service: Endoscopy;  Laterality: N/A;  . CORONARY ANGIOPLASTY WITH STENT PLACEMENT     "I have 9 stents"  . ESOPHAGOGASTRODUODENOSCOPY N/A 10/19/2014   Procedure:  ESOPHAGOGASTRODUODENOSCOPY (EGD);  Surgeon: Jerene Bears, MD;  Location: [facility] ENDOSCOPY;  Service: Endoscopy;  Laterality: N/A;  . FUSION OF TALONAVICULAR JOINT Right 04/15/2014   dr  duda  . HERNIA REPAIR     umbilical  . INGUINAL HERNIA REPAIR Right 05/11/2015   Procedure: LAPAROSCOPIC REPAIR RIGHT  INGUINAL HERNIA;  Surgeon: Greer Pickerel, MD;  Location: [facility];  Service: General;  Laterality: Right;  . INSERTION OF MESH Right 05/11/2015   Procedure: INSERTION OF MESH;  Surgeon: Greer Pickerel, MD;  Location: [facility];  Service: General;  Laterality: Right;  . JOINT REPLACEMENT Bilateral    knees  . KNEE CARTILAGE SURGERY Right X 12   "~ 1/2 open; ~ 1/2 scopes"  . KNEE CARTILAGE SURGERY Left X 3   "3 scopes"  . LEFT HEART CATHETERIZATION WITH CORONARY ANGIOGRAM N/A 02/10/2013   Procedure: LEFT HEART CATHETERIZATION WITH CORONARY ANGIOGRAM;  Surgeon: Burnell Blanks, MD;  Location: [facility] CATH LAB;  Service: Cardiovascular;  Laterality: N/A;  . LUMBAR LAMINECTOMY/DECOMPRESSION MICRODISCECTOMY N/A 01/27/2014   Procedure: CENTRAL LUMBAR LAMINECTOMY L4-5 AND L3-4;  Surgeon: Jessy Oto, MD;  Location: [place];  Service: Orthopedics;  Laterality: N/A;  . ORIF ANKLE FRACTURE Right 09/02/2015   Procedure: OPEN REDUCTION INTERNAL FIXATION (ORIF) ANKLE FRACTURE;  Surgeon: Newt Minion, MD;  Location: [facility];  Service: Orthopedics;  Laterality: Right;  . PERIPHERALLY INSERTED CENTRAL CATHETER INSERTION  09/02/2015  . SHOULDER ARTHROSCOPY W/ ROTATOR CUFF REPAIR Bilateral    "3 on the right; 1 on the left"  . SKIN SPLIT GRAFT Right 10/01/2015   Procedure: RIGHT ANKLE APPLY SKIN GRAFT SPLIT THICKNESS;  Surgeon: Newt Minion, MD;  Location: [facility];  Service: Orthopedics;  Laterality: Right;  . TONSILLECTOMY    . TOTAL KNEE ARTHROPLASTY Bilateral 2008  . UMBILICAL HERNIA REPAIR     UHR  . URETHRAL DILATION  X 4  . VASECTOMY     Social History   Occupational History  . disabled since 2006 due to ortho.  heart, psych Unemployed  . part time work as an Multimedia programmer, wrestling, and baseball Agricultural consultant    Social History Main Topics  . Smoking status: Former Smoker    Types: Cigars    Quit date: 08/28/2010  . Smokeless tobacco: Never Used     Comment: 04/18/2016 "smoked 1 cigar/wk when I did smoke"  . Alcohol use No     Comment: rare (4 beers all summer)  None at all now (03/2014)  . Drug use: No  . Sexual activity: Not Currently

## 2016-09-05 ENCOUNTER — Encounter (HOSPITAL_COMMUNITY): Payer: Self-pay | Admitting: Psychiatry

## 2016-09-05 ENCOUNTER — Ambulatory Visit (INDEPENDENT_AMBULATORY_CARE_PROVIDER_SITE_OTHER): Payer: Medicare PPO | Admitting: Psychiatry

## 2016-09-05 DIAGNOSIS — F319 Bipolar disorder, unspecified: Secondary | ICD-10-CM | POA: Diagnosis not present

## 2016-09-05 DIAGNOSIS — Z87891 Personal history of nicotine dependence: Secondary | ICD-10-CM | POA: Diagnosis not present

## 2016-09-05 DIAGNOSIS — Z818 Family history of other mental and behavioral disorders: Secondary | ICD-10-CM | POA: Diagnosis not present

## 2016-09-05 DIAGNOSIS — Z7982 Long term (current) use of aspirin: Secondary | ICD-10-CM

## 2016-09-05 DIAGNOSIS — Z79899 Other long term (current) drug therapy: Secondary | ICD-10-CM

## 2016-09-05 DIAGNOSIS — Z79891 Long term (current) use of opiate analgesic: Secondary | ICD-10-CM

## 2016-09-05 MED ORDER — FLUOXETINE HCL 40 MG PO CAPS: ORAL_CAPSULE | ORAL | 0 refills | Status: DC

## 2016-09-05 MED ORDER — TRAZODONE HCL 150 MG PO TABS: 150.0000 mg | ORAL_TABLET | Freq: Every day | ORAL | 0 refills | Status: DC

## 2016-09-05 MED ORDER — BENZTROPINE MESYLATE 0.5 MG PO TABS: 0.5000 mg | ORAL_TABLET | Freq: Every day | ORAL | 0 refills | Status: DC

## 2016-09-05 MED ORDER — ARIPIPRAZOLE 5 MG PO TABS: 5.0000 mg | ORAL_TABLET | Freq: Every day | ORAL | 0 refills | Status: DC

## 2016-09-05 NOTE — Progress Notes (Signed)
BH MD/PA/NP OP Progress Note  09/05/2016 1:33 PM Travis Fortis Sr.  MRN:  [id number]  Chief Complaint:  Subjective:  I had amputation on right leg 3 months ago.  I'm feeling better.  HPI: Travis [name] came for his follow-up appointment.  He has below knee right leg amputation due to persistent infection and not feeling better.  Patient told despite multiple surgery his surgeon was not optimistic and decided amputation.  He is feeling much better.  He has less pain and he is now coming off slowly from the pain medication.  He sleeping better.  He likes trazodone which was increased on his last visit.  Now he can sleep all night.  He denies any irritability, anger, mania or any psychosis.  He is doing physical therapy and currently living in rehabilitation facility [facility] but scheduled to go back home this Friday.  Patient told he has support from his niece, her children and her husband as they live with him.  He is compliant with Abilify, trazodone, Prozac .  His tremors are much better since we added Cogentin on his last visit.  He is taking Lyrica and Klonopin from other provider.  He still have back pain but he is not easily frustrated or irritable with the pain.  He denies any suicidal thoughts or homicidal thought.  He denies any crying spells or any feeling of hopelessness or worthlessness.  Patient denies drinking alcohol or using any illegal substances.  He admitted gained weight because lack of ambulation but hoping to lose weight once he is more steady in walking.  Currently he is using crutches.  He is scheduled to see his primary care physician in coming weeks.   Visit Diagnosis:    ICD-9-CM ICD-10-CM   1. Bipolar 1 disorder (HCC) 296.7 F31.9 traZODone (DESYREL) 150 MG tablet     benztropine (COGENTIN) 0.5 MG tablet     ARIPiprazole (ABILIFY) 5 MG tablet     FLUoxetine (PROZAC) 40 MG capsule    Past Psychiatric History: Reviewed. Patient has history of bipolar disorder and he is  seeing psychiatrists since age 78. He saw psychiatrist in [place] and [place]. He has history of severe mood swings , anger issues, hallucination, irritability and poor impulse control and excessive spending. He married 5 times and he has 2 children but patient has no contact with them.He has one psychiatric hospitalization in 2014 when he took overdose on Xanax with alcohol. He had at least history of 2 suicidal gesture in the past. Patient also attended intensive outpatient program in the past. He had tried Effexor, Xanax, Klonopin, Ambien , Lunesta, Wellbutrin, Ambien and Remeron. He was started on Abilify from this office.   Past Medical History:  Past Medical History:  Diagnosis Date  . ALLERGIC RHINITIS 06/24/2009  . Allergic rhinitis 06/24/2009   Qualifier: Diagnosis of  By: [name], [name]   . Anxiety 11/12/2011   Adequate for discharge   . Arthritis    "all my joints" (09/30/2013)  . Arthritis of foot, right, degenerative 04/15/2014  . Balance disorder 03/12/2013  . Benign neoplasm of cecum   . Benign neoplasm of descending colon   . CAD (coronary artery disease) 06/24/2009   5 stents placed in 2007    . Chronic anticoagulation   . Chronic pain syndrome 10/27/2009   of ankle, shoulders, low back.  sciatica.   . Closed fracture of right foot 10/17/2014  . CORONARY ARTERY DISEASE 06/24/2009   a. s/p multiple PCIs -  In 2008 he had a Taxus DES to the mild LAD, Endeavor DES to mid LCX and distal LCX. In January 2009 he had DES to distal LCX, mid LCX and proximal LCX. In November 2009 had BMS x 2 to the mid RCA. Cath 10/2011 with patent stents, noncardiac CP. LHC 01/2013: patent stents (noncardiac CP).  . DEGENERATIVE JOINT DISEASE 06/24/2009   Qualifier: Diagnosis of  By: [name], [name]   . Depression   . Depression with anxiety    Prior suicide attempt  . [place] DISEASE, LUMBAR 04/19/2010  . ERECTILE DYSFUNCTION, ORGANIC 05/30/2010  . Essential hypertension 06/24/2009   Qualifier: Diagnosis of   By: [name], [name]   . Fibromyalgia   . Fracture dislocation of ankle joint 09/02/2015  . Gait disorder 03/12/2013  . General weakness 07/14/2014  . GERD (gastroesophageal reflux disease) 09/08/2015  . Hand joint pain 06/10/2013  . Heart murmur   . Hepatitis C   . History of kidney stones   . Hyperlipidemia 07/15/2009   Qualifier: Diagnosis of  By: Aundra Dubin, MD, [place]    . HYPERLIPIDEMIA-MIXED 07/15/2009  . HYPERTENSION 06/24/2009  . Insomnia 10/04/2011  . Irregular heart beat   . Left hip pain 03/12/2013   Injected under ultrasound guidance on June 24, 2013   . Major depression 09/13/2015  . Myocardial infarction ([place]) 2008  . Non-cardiac chest pain 10/2011, 01/2013  . Obesity   . Occult blood positive stool 10/17/2014  . Open ankle fracture 09/02/2015  . OSA (obstructive sleep apnea)    not using CPAP (09/30/2013)  . Pain of right thumb 04/03/2013  . Pneumonia   . PPD positive 04/08/2015  . Pre-ulcerative corn or callous 02/06/2013  . Restless legs   . Rotator cuff tear arthropathy of both shoulders 06/10/2013   History of bilateral shoulder cuff surgery for rotator cuff tears. Reports increase in pain 09/11/2015 during physical therapy of the left shoulder.   [name] SCIATICA, LEFT 04/19/2010   Qualifier: Diagnosis of  By: [name], [name]   . Spinal stenosis in cervical region 09/26/2013  . Spinal stenosis, lumbar region, with neurogenic claudication 09/26/2013  . Type II diabetes mellitus ([place]) 2012   no meds in 09/2014.   [name] Uncontrolled type 2 DM with peripheral circulatory disorder ([place]) 10/04/2013  . URETHRAL STRICTURE 06/24/2009   self catheterizes.     Past Surgical History:  Procedure Laterality Date  . AMPUTATION Right 06/14/2016   Procedure: AMPUTATION BELOW KNEE;  Surgeon: Newt Minion, MD;  Location: [facility];  Service: Orthopedics;  Laterality: Right;  . ANKLE FUSION Right 04/15/2014   Procedure: Right Subtalar, Talonavicular Fusion;  Surgeon: Newt Minion, MD;  Location: [facility];   Service: Orthopedics;  Laterality: Right;  . ANKLE FUSION Right 04/18/2016   Right Ankle Tibiocalcaneal Fusion; Removal of deep retained hardware; application of wound vac/notes 04/18/2016  . ANKLE FUSION Right 04/18/2016   Procedure: Right Ankle Tibiocalcaneal Fusion;  Surgeon: Newt Minion, MD;  Location: [facility];  Service: Orthopedics;  Laterality: Right;  . ANTERIOR CERVICAL DECOMP/DISCECTOMY FUSION N/A 09/26/2013   Procedure: ANTERIOR CERVICAL DISCECTOMY FUSION C3-4, plate and screw fixation, allograft bone graft;  Surgeon: Jessy Oto, MD;  Location: [facility];  Service: Orthopedics;  Laterality: N/A;  . BACK SURGERY    . BELOW KNEE LEG AMPUTATION Right 06/14/2016  . CARDIAC CATHETERIZATION  X 1  . CARPAL TUNNEL RELEASE Bilateral   . COLONOSCOPY    . COLONOSCOPY N/A  10/22/2014   Procedure: COLONOSCOPY;  Surgeon: Lafayette Dragon, MD;  Location: [facility] ENDOSCOPY;  Service: Endoscopy;  Laterality: N/A;  . CORONARY ANGIOPLASTY WITH STENT PLACEMENT     "I have 9 stents"  . ESOPHAGOGASTRODUODENOSCOPY N/A 10/19/2014   Procedure: ESOPHAGOGASTRODUODENOSCOPY (EGD);  Surgeon: Jerene Bears, MD;  Location: [facility] ENDOSCOPY;  Service: Endoscopy;  Laterality: N/A;  . FUSION OF TALONAVICULAR JOINT Right 04/15/2014   dr duda  . HERNIA REPAIR     umbilical  . INGUINAL HERNIA REPAIR Right 05/11/2015   Procedure: LAPAROSCOPIC REPAIR RIGHT  INGUINAL HERNIA;  Surgeon: Greer Pickerel, MD;  Location: [facility];  Service: General;  Laterality: Right;  . INSERTION OF MESH Right 05/11/2015   Procedure: INSERTION OF MESH;  Surgeon: Greer Pickerel, MD;  Location: [facility];  Service: General;  Laterality: Right;  . JOINT REPLACEMENT Bilateral    knees  . KNEE CARTILAGE SURGERY Right X 12   "~ 1/2 open; ~ 1/2 scopes"  . KNEE CARTILAGE SURGERY Left X 3   "3 scopes"  . LEFT HEART CATHETERIZATION WITH CORONARY ANGIOGRAM N/A 02/10/2013   Procedure: LEFT HEART CATHETERIZATION WITH CORONARY ANGIOGRAM;  Surgeon: Burnell Blanks, MD;   Location: [facility] CATH LAB;  Service: Cardiovascular;  Laterality: N/A;  . LUMBAR LAMINECTOMY/DECOMPRESSION MICRODISCECTOMY N/A 01/27/2014   Procedure: CENTRAL LUMBAR LAMINECTOMY L4-5 AND L3-4;  Surgeon: Jessy Oto, MD;  Location: [place] [facility];  Service: Orthopedics;  Laterality: N/A;  . ORIF ANKLE FRACTURE Right 09/02/2015   Procedure: OPEN REDUCTION INTERNAL FIXATION (ORIF) ANKLE FRACTURE;  Surgeon: Newt Minion, MD;  Location: [facility];  Service: Orthopedics;  Laterality: Right;  . PERIPHERALLY INSERTED CENTRAL CATHETER INSERTION  09/02/2015  . SHOULDER ARTHROSCOPY W/ ROTATOR CUFF REPAIR Bilateral    "3 on the right; 1 on the left"  . SKIN SPLIT GRAFT Right 10/01/2015   Procedure: RIGHT ANKLE APPLY SKIN GRAFT SPLIT THICKNESS;  Surgeon: Newt Minion, MD;  Location: [place];  Service: Orthopedics;  Laterality: Right;  . TONSILLECTOMY    . TOTAL KNEE ARTHROPLASTY Bilateral 2008  . UMBILICAL HERNIA REPAIR     UHR  . URETHRAL DILATION  X 4  . VASECTOMY      Family Psychiatric History: Reviewed.  Family History:  Family History  Problem Relation Age of Onset  . Depression Mother   . Heart disease Mother   . Hypertension Mother   . Cancer Mother     Breast  . Diabetes Father   . Heart disease Father     CABG  . Cancer Father     prostate and skin cancer  . Hypertension Father   . Hyperlipidemia Father   . Depression Brother     x 2  . Hypertension Brother     x2  . Heart disease Maternal Grandfather   . Early death Maternal Grandfather 93    heart attack  . Early death Paternal Grandfather   . Coronary artery disease Other   . Hypertension Other   . Depression Other     Social History:  Social History   Social History  . Marital status: Divorced    Spouse name: N/A  . Number of children: 2  . Years of education: N/A   Occupational History  . disabled since 2006 due to ortho. heart, psych Unemployed  . part time work as an Multimedia programmer, wrestling, and baseball Leisure centre manager    Social History Main Topics  . Smoking status: Former Smoker  Types: Cigars    Quit date: 08/28/2010  . Smokeless tobacco: Never Used     Comment: 04/18/2016 "smoked 1 cigar/wk when I did smoke"  . Alcohol use No     Comment: rarely  . Drug use: No  . Sexual activity: Not Currently   Other Topics Concern  . None   Social History Narrative   Played semi-pro football, used steroids   Divorced moved here from [place], [place]   Patient states he has been on disability since his knee surgery.      03/05/2013 AHW "[name]" was born and grew up in [place], [place], and moved to [place], [place] at age 61. He has one sister and 3 brothers. He reports that his childhood was rough. His parents are deceased. He graduated from [employer], and attended one year of college. He is currently unemployed and on disability for multiple medical problems. He has been married 5 times. He has 2 sons. He currently lives alone. 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 He denies that he has any social support system. 03/05/2013 AHW             Allergies: No Known Allergies  Metabolic Disorder Labs: Lab Results  Component Value Date   HGBA1C 5.3 04/18/2016   MPG 105 04/18/2016   MPG 114 09/26/2013   No results found for: PROLACTIN Lab Results  Component Value Date   CHOL 109 11/10/2015   TRIG 81.0 11/10/2015   HDL 41.60 11/10/2015   CHOLHDL 3 11/10/2015   VLDL 16.2 11/10/2015   LDLCALC 51 11/10/2015   LDLCALC 51 03/12/2015     Current Medications: Current Outpatient Prescriptions  Medication Sig Dispense Refill  . AMITIZA 24 MCG capsule Take 24 mcg by mouth 2 (two) times daily with a meal.     . ARIPiprazole (ABILIFY) 5 MG tablet Take 1 tablet (5 mg total) by mouth daily. Take one tablet at bedtime 90 tablet 0  . aspirin 81 MG tablet Take 81 mg by mouth daily.    [name] atorvastatin (LIPITOR) 20 MG tablet TAKE 1 TABLET BY MOUTH  EVERY DAY 90 tablet 3  . benztropine (COGENTIN) 0.5 MG tablet Take 1 tablet (0.5 mg total) by mouth at bedtime. 90 tablet 0  . clonazePAM (KLONOPIN) 1 MG tablet TAKE 1 TABLET BY MOUTH AT BEDTIME AS NEEDED FOR ANXIETY 30 tablet 5  . cyclobenzaprine (FLEXERIL) 10 MG tablet   0  . doxycycline (VIBRAMYCIN) 100 MG capsule Take 1 capsule (100 mg total) by mouth 2 (two) times daily. 60 capsule 0  . FLUoxetine (PROZAC) 40 MG capsule TAKE 1 CAPSULE(40 MG) BY MOUTH DAILY 90 capsule 0  . gabapentin (NEURONTIN) 300 MG capsule     . LYRICA 100 MG capsule Take 1 capsule (100 mg total) by mouth 3 (three) times daily. 90 capsule 2  . metFORMIN (GLUCOPHAGE) 500 MG tablet Take 1 tablet (500 mg total) by mouth 2 (two) times daily with a meal. 180 tablet 3  . methocarbamol (ROBAXIN) 500 MG tablet Take 1 tablet (500 mg total) by mouth every 6 (six) hours as needed for muscle spasms. 30 tablet 0  . mirabegron ER (MYRBETRIQ) 25 MG TB24 tablet Take 25 mg by mouth daily.    . Omega-3 Fatty Acids (FISH OIL) 1000 MG CAPS Take 1,000 mg by mouth daily.     [name] oxyCODONE-acetaminophen (PERCOCET) 10-325 MG tablet Take 1 tablet by mouth every 4 (four) hours as needed for  pain. 60 tablet 0  . oxyCODONE-acetaminophen (PERCOCET) 10-325 MG tablet Take 1 tablet by mouth every 4 (four) hours as needed for pain. 30 tablet 0  . prasugrel (EFFIENT) 5 MG TABS tablet Take 1 tablet (5 mg total) by mouth daily. 30 tablet 3  . tamsulosin (FLOMAX) 0.4 MG CAPS capsule Take 0.4 mg by mouth daily after breakfast.     . traZODone (DESYREL) 100 MG tablet   0  . traZODone (DESYREL) 150 MG tablet Take 1 tablet (150 mg total) by mouth at bedtime. 90 tablet 0  . triamcinolone (NASACORT AQ) 55 MCG/ACT AERO nasal inhaler Place 2 sprays into the nose daily. 1 Inhaler 12   No current facility-administered medications for this visit.     Neurologic: Headache: No Seizure: No Paresthesias: Yes  Musculoskeletal: Strength & Muscle Tone: decreased Gait  & Station: unsteady, Uses crutches.  Had right leg below-knee amputation Patient leans: Left  Psychiatric Specialty Exam: ROS  Blood pressure 128/76, pulse 72, height 6' 1"  (1.854 m), weight 295 lb 9.6 oz (134.1 kg).Body mass index is 39 kg/m.  General Appearance: Casual and Fairly Groomed  Eye Contact:  Good  Speech:  Slow  Volume:  Normal  Mood:  Anxious  Affect:  Congruent  Thought Process:  Goal Directed  Orientation:  Full (Time, Place, and Person)  Thought Content: WDL and Logical   Suicidal Thoughts:  No  Homicidal Thoughts:  No  Memory:  Immediate;   Good Recent;   Fair Remote;   Fair  Judgement:  Good  Insight:  Good  Psychomotor Activity:  Decreased  Concentration:  Concentration: Fair and Attention Span: Fair  Recall:  Good  Fund of Knowledge: Good  Language: Good  Akathisia:  No  Handed:  Right  AIMS (if indicated):  0  Assets:  Communication Skills Desire for Improvement Housing Resilience  ADL's:  Intact  Cognition: WNL  Sleep:  Good     Assessment: Bipolar disorder type I  Plan: Despite recent surgery for his leg, he is doing better.  He sleeping good with increase trazodone.  He has no tremors shakes or any EPS.  He had cut down his narcotic pain medication.  I will continue Abilify 5 mg daily, Cogentin 0.5 mg at bedtime, trazodone 150 mg daily and Prozac 40 mg daily.  Patient also getting Lyrica, Klonopin from other provider.  Discussed medication side effects and benefits.  Patient is not interested in counseling at this time.  Discuss safety plan that anytime having active suicidal thoughts or homicidal thought that he need to call 911 or go to the local emergency room.  Encourage to watch his calorie intake as patient has gained weight from the past.  Follow-up in 3 months.  ARFEEN,SYED T., MD 09/05/2016, 1:33 PM

## 2016-09-26 ENCOUNTER — Ambulatory Visit (INDEPENDENT_AMBULATORY_CARE_PROVIDER_SITE_OTHER): Payer: Medicare PPO | Admitting: Orthopedic Surgery

## 2016-09-27 ENCOUNTER — Ambulatory Visit (INDEPENDENT_AMBULATORY_CARE_PROVIDER_SITE_OTHER): Payer: Medicare PPO | Admitting: Orthopedic Surgery

## 2016-09-27 ENCOUNTER — Ambulatory Visit (INDEPENDENT_AMBULATORY_CARE_PROVIDER_SITE_OTHER): Payer: Medicare PPO | Admitting: Family

## 2016-09-28 ENCOUNTER — Ambulatory Visit (INDEPENDENT_AMBULATORY_CARE_PROVIDER_SITE_OTHER): Payer: Medicare PPO | Admitting: Family

## 2016-09-28 DIAGNOSIS — M5137 Other intervertebral disc degeneration, lumbosacral region: Secondary | ICD-10-CM | POA: Diagnosis not present

## 2016-09-28 DIAGNOSIS — G546 Phantom limb syndrome with pain: Secondary | ICD-10-CM | POA: Diagnosis not present

## 2016-09-28 DIAGNOSIS — Z89511 Acquired absence of right leg below knee: Secondary | ICD-10-CM | POA: Diagnosis not present

## 2016-09-28 MED ORDER — OXYCODONE-ACETAMINOPHEN 5-325 MG PO TABS: 1.0000 | ORAL_TABLET | Freq: Two times a day (BID) | ORAL | 0 refills | Status: DC | PRN

## 2016-09-29 ENCOUNTER — Ambulatory Visit (INDEPENDENT_AMBULATORY_CARE_PROVIDER_SITE_OTHER): Payer: Medicare PPO | Admitting: Orthopedic Surgery

## 2016-09-29 NOTE — Progress Notes (Signed)
Office Visit Note   Patient: Travis Bur Sr.           Date of Birth: 24-Apr-1951           MRN: [id number] Visit Date: 09/28/2016              Requested by: [name], MD [place], [place] [address] PCP: [name], MD  No chief complaint on file.     HPI: The patient is a 66 year old gentleman who presents today in follow-up for transtibial amputation on the right. Is complaining of some phantom limb pain. This is intermittent sharp pain. Him the patient also has chronic low back pain. The patient today is full weightbearing in his prosthetic. Is using 2 crutches is essentially curing them. Feels he needs these for stability with ambulation. Continues working with physical therapy for gait training. Is currently in a temporary prosthesis.  Assessment & Plan: Visit Diagnoses:  1. S/P unilateral BKA (below knee amputation), right ([place])   2. DISC DISEASE, LUMBAR   3. Pain, phantom limb ([place])     Plan: Continue with gait training and physical therapy. Will follow up in office as needed.  Follow-Up Instructions: Return in about 6 weeks (around 11/09/2016).   Ortho Exam  Patient is alert, oriented, no adenopathy, well-dressed, normal affect, normal respiratory effort. The right residual limb is well healed is consolidating well there are no open areas no impending breakdown. There is no callus buildup.  Imaging: No results found.  Labs: Lab Results  Component Value Date   HGBA1C 5.3 04/18/2016   HGBA1C 5.2 11/10/2015   HGBA1C 5.4 03/12/2015   ESRSEDRATE 20 (H) 09/03/2013   ESRSEDRATE 11 03/25/2013   CRP 10.7 (H) 09/03/2013   REPTSTATUS 01/30/2014 FINAL 01/29/2014   GRAMSTAIN  09/02/2013    WBC PRESENT, PREDOMINANTLY MONONUCLEAR NO ORGANISMS SEEN CYTOSPIN Performed by [facility] Performed at [facility]  09/02/2013    NO ORGANISMS SEEN WBC PRESENT, PREDOMINANTLY MONONUCLEAR CYTOSPIN Gram Stain Report  Called to,Read Back By and Verified With: A LEDWELL RN 1931 09/02/13 A NAVARRO   CULT NO GROWTH Performed at [facility] 01/29/2014   LABORGA STAPHYLOCOCCUS SPECIES (COAGULASE NEGATIVE) 04/08/2015    Orders:  No orders of the defined types were placed in this encounter.  Meds ordered this encounter  Medications  . oxyCODONE-acetaminophen (PERCOCET/ROXICET) 5-325 MG tablet    Sig: Take 1 tablet by mouth 2 (two) times daily as needed.    Dispense:  60 tablet    Refill:  0     Procedures: No procedures performed  Clinical Data: No additional findings.  ROS:  All other systems negative, except as noted in the HPI. Review of Systems  Constitutional: Negative for chills and fever.  Cardiovascular: Negative for leg swelling.  Musculoskeletal: Positive for back pain and gait problem.  Skin: Negative for color change and wound.  Neurological: Positive for numbness.    Objective: Vital Signs: There were no vitals taken for this visit.  Specialty Comments:  No specialty comments available.  PMFS History: Patient Active Problem List   Diagnosis Date Noted  . S/P unilateral BKA (below knee amputation), right ([place]) 06/14/2016  . Charcot foot due to diabetes mellitus ([place])   . Charcot's arthropathy associated with type 2 diabetes mellitus ([place]) 04/11/2016  . Encounter for well adult exam with abnormal findings 11/05/2015  . Major depression 09/13/2015  . S/P TKR (total knee  replacement) bilaterally 09/13/2015  . GERD (gastroesophageal reflux disease) 09/08/2015  . S/P laparoscopic hernia repair 05/11/2015  . PPD positive 04/08/2015  . Benign neoplasm of descending colon   . Benign neoplasm of cecum   . AKI (acute kidney injury) ([place]) 10/18/2014  . Acute blood loss anemia   . Chronic anticoagulation   . Occult blood positive stool 10/17/2014  . General weakness 07/14/2014  . Urinary incontinence 07/14/2014  . Headache(784.0) 10/15/2013  . Spinal stenosis in  cervical region 09/26/2013    Class: Chronic  . Spinal stenosis, lumbar region, with neurogenic claudication 09/26/2013    Class: Chronic  . Hand joint pain 06/10/2013  . Rotator cuff tear arthropathy of both shoulders 06/10/2013  . Skin lesion of cheek 05/01/2013  . Pain of right thumb 04/03/2013  . Balance disorder 03/12/2013  . Gait disorder 03/12/2013  . Tremor 03/12/2013  . Left hip pain 03/12/2013  . Pre-ulcerative corn or callous 02/06/2013  . Anxiety 11/12/2011  . OSA (obstructive sleep apnea) 11/07/2011  . Bradycardia 10/20/2011  . Insomnia 10/04/2011  . Obesity 01/12/2011  . ERECTILE DYSFUNCTION, ORGANIC 05/30/2010  . [place] DISEASE, LUMBAR 04/19/2010  . SCIATICA, LEFT 04/19/2010  . Chronic pain syndrome 10/27/2009  . Hyperlipidemia 07/15/2009  . Essential hypertension 06/24/2009  . CAD (coronary artery disease) 06/24/2009  . Allergic rhinitis 06/24/2009  . URETHRAL STRICTURE 06/24/2009  . DEGENERATIVE JOINT DISEASE 06/24/2009  . SHOULDER PAIN, BILATERAL 06/24/2009  . FATIGUE 06/24/2009  . NEPHROLITHIASIS, HX OF 06/24/2009   Past Medical History:  Diagnosis Date  . ALLERGIC RHINITIS 06/24/2009  . Allergic rhinitis 06/24/2009   Qualifier: Diagnosis of  By: [name], [name]   . Anxiety 11/12/2011   Adequate for discharge   . Arthritis    "all my joints" (09/30/2013)  . Arthritis of foot, right, degenerative 04/15/2014  . Balance disorder 03/12/2013  . Benign neoplasm of cecum   . Benign neoplasm of descending colon   . CAD (coronary artery disease) 06/24/2009   5 stents placed in 2007    . Chronic anticoagulation   . Chronic pain syndrome 10/27/2009   of ankle, shoulders, low back.  sciatica.   . Closed fracture of right foot 10/17/2014  . CORONARY ARTERY DISEASE 06/24/2009   a. s/p multiple PCIs - In 2008 he had a Taxus DES to the mild LAD, Endeavor DES to mid LCX and distal LCX. In January 2009 he had DES to distal LCX, mid LCX and proximal LCX. In November 2009 had BMS  x 2 to the mid RCA. Cath 10/2011 with patent stents, noncardiac CP. LHC 01/2013: patent stents (noncardiac CP).  . DEGENERATIVE JOINT DISEASE 06/24/2009   Qualifier: Diagnosis of  By: [name], [name]   . Depression   . Depression with anxiety    Prior suicide attempt  . [place] DISEASE, LUMBAR 04/19/2010  . ERECTILE DYSFUNCTION, ORGANIC 05/30/2010  . Essential hypertension 06/24/2009   Qualifier: Diagnosis of  By: [name], [name]   . Fibromyalgia   . Fracture dislocation of ankle joint 09/02/2015  . Gait disorder 03/12/2013  . General weakness 07/14/2014  . GERD (gastroesophageal reflux disease) 09/08/2015  . Hand joint pain 06/10/2013  . Heart murmur   . Hepatitis C   . History of kidney stones   . Hyperlipidemia 07/15/2009   Qualifier: Diagnosis of  By: Aundra Dubin, MD, [place]    . HYPERLIPIDEMIA-MIXED 07/15/2009  . HYPERTENSION 06/24/2009  . Insomnia 10/04/2011  . Irregular heart beat   .  Left hip pain 03/12/2013   Injected under ultrasound guidance on June 24, 2013   . Major depression 09/13/2015  . Myocardial infarction ([place]) 2008  . Non-cardiac chest pain 10/2011, 01/2013  . Obesity   . Occult blood positive stool 10/17/2014  . Open ankle fracture 09/02/2015  . OSA (obstructive sleep apnea)    not using CPAP (09/30/2013)  . Pain of right thumb 04/03/2013  . Pneumonia   . PPD positive 04/08/2015  . Pre-ulcerative corn or callous 02/06/2013  . Restless legs   . Rotator cuff tear arthropathy of both shoulders 06/10/2013   History of bilateral shoulder cuff surgery for rotator cuff tears. Reports increase in pain 09/11/2015 during physical therapy of the left shoulder.   [name] SCIATICA, LEFT 04/19/2010   Qualifier: Diagnosis of  By: [name], [name]   . Spinal stenosis in cervical region 09/26/2013  . Spinal stenosis, lumbar region, with neurogenic claudication 09/26/2013  . Type II diabetes mellitus ([place]) 2012   no meds in 09/2014.   [name] Uncontrolled type 2 DM with peripheral circulatory disorder ([place])  10/04/2013  . URETHRAL STRICTURE 06/24/2009   self catheterizes.     Family History  Problem Relation Age of Onset  . Depression Mother   . Heart disease Mother   . Hypertension Mother   . Cancer Mother        Breast  . Diabetes Father   . Heart disease Father        CABG  . Cancer Father        prostate and skin cancer  . Hypertension Father   . Hyperlipidemia Father   . Depression Brother        x 2  . Hypertension Brother        x2  . Heart disease Maternal Grandfather   . Early death Maternal Grandfather 48       heart attack  . Early death Paternal Grandfather   . Coronary artery disease Other   . Hypertension Other   . Depression Other     Past Surgical History:  Procedure Laterality Date  . AMPUTATION Right 06/14/2016   Procedure: AMPUTATION BELOW KNEE;  Surgeon: Newt Minion, MD;  Location: [facility];  Service: Orthopedics;  Laterality: Right;  . ANKLE FUSION Right 04/15/2014   Procedure: Right Subtalar, Talonavicular Fusion;  Surgeon: Newt Minion, MD;  Location: [facility];  Service: Orthopedics;  Laterality: Right;  . ANKLE FUSION Right 04/18/2016   Right Ankle Tibiocalcaneal Fusion; Removal of deep retained hardware; application of wound vac/notes 04/18/2016  . ANKLE FUSION Right 04/18/2016   Procedure: Right Ankle Tibiocalcaneal Fusion;  Surgeon: Newt Minion, MD;  Location: [facility];  Service: Orthopedics;  Laterality: Right;  . ANTERIOR CERVICAL DECOMP/DISCECTOMY FUSION N/A 09/26/2013   Procedure: ANTERIOR CERVICAL DISCECTOMY FUSION C3-4, plate and screw fixation, allograft bone graft;  Surgeon: Jessy Oto, MD;  Location: [facility];  Service: Orthopedics;  Laterality: N/A;  . BACK SURGERY    . BELOW KNEE LEG AMPUTATION Right 06/14/2016  . CARDIAC CATHETERIZATION  X 1  . CARPAL TUNNEL RELEASE Bilateral   . COLONOSCOPY    . COLONOSCOPY N/A 10/22/2014   Procedure: COLONOSCOPY;  Surgeon: Lafayette Dragon, MD;  [facility] ENDOSCOPY;  Service: Endoscopy;  Laterality: N/A;  .  CORONARY ANGIOPLASTY WITH STENT PLACEMENT     "I have 9 stents"  . ESOPHAGOGASTRODUODENOSCOPY N/A 10/19/2014   Procedure: ESOPHAGOGASTRODUODENOSCOPY (EGD);  Surgeon: Jerene Bears, MD;  Location:  [facility] ENDOSCOPY;  Service: Endoscopy;  Laterality: N/A;  . FUSION OF TALONAVICULAR JOINT Right 04/15/2014   dr duda  . HERNIA REPAIR     umbilical  . INGUINAL HERNIA REPAIR Right 05/11/2015   Procedure: LAPAROSCOPIC REPAIR RIGHT  INGUINAL HERNIA;  Surgeon: Greer Pickerel, MD;  Location: [facility];  Service: General;  Laterality: Right;  . INSERTION OF MESH Right 05/11/2015   Procedure: INSERTION OF MESH;  Surgeon: Greer Pickerel, MD;  Location: [facility];  Service: General;  Laterality: Right;  . JOINT REPLACEMENT Bilateral    knees  . KNEE CARTILAGE SURGERY Right X 12   "~ 1/2 open; ~ 1/2 scopes"  . KNEE CARTILAGE SURGERY Left X 3   "3 scopes"  . LEFT HEART CATHETERIZATION WITH CORONARY ANGIOGRAM N/A 02/10/2013   Procedure: LEFT HEART CATHETERIZATION WITH CORONARY ANGIOGRAM;  Surgeon: Burnell Blanks, MD;  Location: [facility] CATH LAB;  Service: Cardiovascular;  Laterality: N/A;  . LUMBAR LAMINECTOMY/DECOMPRESSION MICRODISCECTOMY N/A 01/27/2014   Procedure: CENTRAL LUMBAR LAMINECTOMY L4-5 AND L3-4;  Surgeon: Jessy Oto, MD;  Location: [facility];  Service: Orthopedics;  Laterality: N/A;  . ORIF ANKLE FRACTURE Right 09/02/2015   Procedure: OPEN REDUCTION INTERNAL FIXATION (ORIF) ANKLE FRACTURE;  Surgeon: Newt Minion, MD;  Location: [facility];  Service: Orthopedics;  Laterality: Right;  . PERIPHERALLY INSERTED CENTRAL CATHETER INSERTION  09/02/2015  . SHOULDER ARTHROSCOPY W/ ROTATOR CUFF REPAIR Bilateral    "3 on the right; 1 on the left"  . SKIN SPLIT GRAFT Right 10/01/2015   Procedure: RIGHT ANKLE APPLY SKIN GRAFT SPLIT THICKNESS;  Surgeon: Newt Minion, MD;  Location: [facility];  Service: Orthopedics;  Laterality: Right;  . TONSILLECTOMY    . TOTAL KNEE ARTHROPLASTY Bilateral 2008  . UMBILICAL HERNIA REPAIR     UHR  .  URETHRAL DILATION  X 4  . VASECTOMY     Social History   Occupational History  . disabled since 2006 due to ortho. heart, psych Unemployed  . part time work as an Multimedia programmer, wrestling, and baseball Agricultural consultant    Social History Main Topics  . Smoking status: Former Smoker    Types: Cigars    Quit date: 08/28/2010  . Smokeless tobacco: Never Used     Comment: 04/18/2016 "smoked 1 cigar/wk when I did smoke"  . Alcohol use No     Comment: rarely  . Drug use: No  . Sexual activity: Not Currently

## 2016-10-06 ENCOUNTER — Telehealth (INDEPENDENT_AMBULATORY_CARE_PROVIDER_SITE_OTHER): Payer: Self-pay | Admitting: *Deleted

## 2016-10-06 NOTE — Telephone Encounter (Signed)
Kristin from [facility] called needing verbal orders for 2x a week for 4 more weeks for physical therapy. [phone]

## 2016-10-09 ENCOUNTER — Other Ambulatory Visit: Payer: Self-pay | Admitting: Internal Medicine

## 2016-10-09 NOTE — Telephone Encounter (Signed)
I called and spoke with Travis [name] to advise that ok for verbal ok for home health physical therapy

## 2016-10-10 NOTE — Telephone Encounter (Signed)
Done hardcopy to [facility]  Please ask pt to make ROV for further refills

## 2016-10-10 NOTE — Telephone Encounter (Signed)
Script faxed to [facility]...Johny Chess

## 2016-10-13 ENCOUNTER — Other Ambulatory Visit: Payer: Self-pay | Admitting: Pharmacy Technician

## 2016-10-13 NOTE — Patient Outreach (Signed)
[facility]) Care Management  10/13/2016  Travis Defalco Sr. February 24, 1951 [id number]  Spoke to patient about Atorvastatin medication adherence. Patient stated he had been hospitalized for about 3 months therefore he has an abundance of medication.  [name] [place], [place] Management [phone]

## 2016-10-20 ENCOUNTER — Other Ambulatory Visit: Payer: Self-pay | Admitting: Cardiology

## 2016-10-20 ENCOUNTER — Encounter: Payer: Self-pay | Admitting: Internal Medicine

## 2016-10-20 ENCOUNTER — Ambulatory Visit (INDEPENDENT_AMBULATORY_CARE_PROVIDER_SITE_OTHER): Payer: Medicare HMO | Admitting: Internal Medicine

## 2016-10-20 VITALS — BP 142/86 | HR 67 | Ht 73.0 in | Wt 281.0 lb

## 2016-10-20 DIAGNOSIS — L989 Disorder of the skin and subcutaneous tissue, unspecified: Secondary | ICD-10-CM | POA: Diagnosis not present

## 2016-10-20 DIAGNOSIS — Z0000 Encounter for general adult medical examination without abnormal findings: Secondary | ICD-10-CM

## 2016-10-20 DIAGNOSIS — J019 Acute sinusitis, unspecified: Secondary | ICD-10-CM | POA: Diagnosis not present

## 2016-10-20 DIAGNOSIS — R739 Hyperglycemia, unspecified: Secondary | ICD-10-CM

## 2016-10-20 DIAGNOSIS — K219 Gastro-esophageal reflux disease without esophagitis: Secondary | ICD-10-CM

## 2016-10-20 DIAGNOSIS — J309 Allergic rhinitis, unspecified: Secondary | ICD-10-CM

## 2016-10-20 MED ORDER — TRIAMCINOLONE ACETONIDE 55 MCG/ACT NA AERO: 2.0000 | INHALATION_SPRAY | Freq: Every day | NASAL | 12 refills | Status: DC

## 2016-10-20 MED ORDER — LEVOFLOXACIN 500 MG PO TABS: 500.0000 mg | ORAL_TABLET | Freq: Every day | ORAL | 0 refills | Status: AC

## 2016-10-20 MED ORDER — CLONAZEPAM 1 MG PO TABS: 1.0000 mg | ORAL_TABLET | Freq: Every day | ORAL | 5 refills | Status: DC

## 2016-10-20 MED ORDER — METHOCARBAMOL 500 MG PO TABS: 500.0000 mg | ORAL_TABLET | Freq: Four times a day (QID) | ORAL | 5 refills | Status: DC | PRN

## 2016-10-20 MED ORDER — HYDROCODONE-HOMATROPINE 5-1.5 MG/5ML PO SYRP: 5.0000 mL | ORAL_SOLUTION | Freq: Four times a day (QID) | ORAL | 0 refills | Status: AC | PRN

## 2016-10-20 MED ORDER — PREDNISONE 10 MG PO TABS: ORAL_TABLET | ORAL | 0 refills | Status: DC

## 2016-10-20 MED ORDER — PANTOPRAZOLE SODIUM 40 MG PO TBEC
40.0000 mg | DELAYED_RELEASE_TABLET | Freq: Every day | ORAL | 3 refills | Status: DC
Start: 2016-10-20 — End: 2017-08-14

## 2016-10-20 MED ORDER — METHYLPREDNISOLONE ACETATE 80 MG/ML IJ SUSP
80.0000 mg | Freq: Once | INTRAMUSCULAR | Status: AC
  Administered 2016-10-20: 80 mg via INTRAMUSCULAR

## 2016-10-20 NOTE — Progress Notes (Signed)
Subjective:    Patient ID: Travis Fortis Sr., male    DOB: 1950/11/13, 66 y.o.   MRN: [id number]  HPI  Here with 2-3 days acute onset fever, facial pain, pressure, headache, general weakness and malaise, and greenish d/c, with mild ST and cough, but pt denies chest pain, wheezing, increased sob or doe, orthopnea, PND, increased LE swelling, palpitations, dizziness or syncope.  Does have several wks ongoing nasal allergy symptoms with clearish congestion, itch and sneezing, without fever, pain, ST, cough, swelling or wheezing.  Also has recent worsening reflux symptoms not better with die change, and no abd pain, dysphagia, n/v, bowel change or blood.   Pt denies polydipsia, polyuria,   Pt states overall good compliance with meds, trying to follow lower cholesterol, diabetic diet, wt overall stable but little exercise however.   Also has 1-2 months onset right tip of nose lesion increase size but no pain, just not improving Past Medical History:  Diagnosis Date  . ALLERGIC RHINITIS 06/24/2009  . Allergic rhinitis 06/24/2009   Qualifier: Diagnosis of  By: [name], [name]   . Anxiety 11/12/2011   Adequate for discharge   . Arthritis    "all my joints" (09/30/2013)  . Arthritis of foot, right, degenerative 04/15/2014  . Balance disorder 03/12/2013  . Benign neoplasm of cecum   . Benign neoplasm of descending colon   . CAD (coronary artery disease) 06/24/2009   5 stents placed in 2007    . Chronic anticoagulation   . Chronic pain syndrome 10/27/2009   of ankle, shoulders, low back.  sciatica.   . Closed fracture of right foot 10/17/2014  . CORONARY ARTERY DISEASE 06/24/2009   a. s/p multiple PCIs - In 2008 he had a Taxus DES to the mild LAD, Endeavor DES to mid LCX and distal LCX. In January 2009 he had DES to distal LCX, mid LCX and proximal LCX. In November 2009 had BMS x 2 to the mid RCA. Cath 10/2011 with patent stents, noncardiac CP. LHC 01/2013: patent stents (noncardiac CP).  . DEGENERATIVE JOINT  DISEASE 06/24/2009   Qualifier: Diagnosis of  By: [name], [name]   . Depression   . Depression with anxiety    Prior suicide attempt  . [place] DISEASE, LUMBAR 04/19/2010  . ERECTILE DYSFUNCTION, ORGANIC 05/30/2010  . Essential hypertension 06/24/2009   Qualifier: Diagnosis of  By: [name], [name]   . Fibromyalgia   . Fracture dislocation of ankle joint 09/02/2015  . Gait disorder 03/12/2013  . General weakness 07/14/2014  . GERD (gastroesophageal reflux disease) 09/08/2015  . Hand joint pain 06/10/2013  . Heart murmur   . Hepatitis C   . History of kidney stones   . Hyperlipidemia 07/15/2009   Qualifier: Diagnosis of  By: Aundra Dubin, MD, [place]    . HYPERLIPIDEMIA-MIXED 07/15/2009  . HYPERTENSION 06/24/2009  . Insomnia 10/04/2011  . Irregular heart beat   . Left hip pain 03/12/2013   Injected under ultrasound guidance on June 24, 2013   . Major depression 09/13/2015  . Myocardial infarction ([place]) 2008  . Non-cardiac chest pain 10/2011, 01/2013  . Obesity   . Occult blood positive stool 10/17/2014  . Open ankle fracture 09/02/2015  . OSA (obstructive sleep apnea)    not using CPAP (09/30/2013)  . Pain of right thumb 04/03/2013  . Pneumonia   . PPD positive 04/08/2015  . Pre-ulcerative corn or callous 02/06/2013  . Restless legs   . Rotator cuff tear arthropathy  of both shoulders 06/10/2013   History of bilateral shoulder cuff surgery for rotator cuff tears. Reports increase in pain 09/11/2015 during physical therapy of the left shoulder.   [name] SCIATICA, LEFT 04/19/2010   Qualifier: Diagnosis of  By: [name], [name]   . Spinal stenosis in cervical region 09/26/2013  . Spinal stenosis, lumbar region, with neurogenic claudication 09/26/2013  . Type II diabetes mellitus ([place]) 2012   no meds in 09/2014.   [name] Uncontrolled type 2 DM with peripheral circulatory disorder ([place]) 10/04/2013  . URETHRAL STRICTURE 06/24/2009   self catheterizes.    Past Surgical History:  Procedure Laterality Date  . AMPUTATION  Right 06/14/2016   Procedure: AMPUTATION BELOW KNEE;  Surgeon: Newt Minion, MD;  Location: [place];  Service: Orthopedics;  Laterality: Right;  . ANKLE FUSION Right 04/15/2014   Procedure: Right Subtalar, Talonavicular Fusion;  Surgeon: Newt Minion, MD;  Location: [place];  Service: Orthopedics;  Laterality: Right;  . ANKLE FUSION Right 04/18/2016   Right Ankle Tibiocalcaneal Fusion; Removal of deep retained hardware; application of wound vac/notes 04/18/2016  . ANKLE FUSION Right 04/18/2016   Procedure: Right Ankle Tibiocalcaneal Fusion;  Surgeon: Newt Minion, MD;  Location: [place];  Service: Orthopedics;  Laterality: Right;  . ANTERIOR CERVICAL DECOMP/DISCECTOMY FUSION N/A 09/26/2013   Procedure: ANTERIOR CERVICAL DISCECTOMY FUSION C3-4, plate and screw fixation, allograft bone graft;  Surgeon: Jessy Oto, MD;  Location: [place];  Service: Orthopedics;  Laterality: N/A;  . BACK SURGERY    . BELOW KNEE LEG AMPUTATION Right 06/14/2016  . CARDIAC CATHETERIZATION  X 1  . CARPAL TUNNEL RELEASE Bilateral   . COLONOSCOPY    . COLONOSCOPY N/A 10/22/2014   Procedure: COLONOSCOPY;  Surgeon: Lafayette Dragon, MD;  Location[facility] ENDOSCOPY;  Service: Endoscopy;  Laterality: N/A;  . CORONARY ANGIOPLASTY WITH STENT PLACEMENT     "I have 9 stents"  . ESOPHAGOGASTRODUODENOSCOPY N/A 10/19/2014   Procedure: ESOPHAGOGASTRODUODENOSCOPY (EGD);  Surgeon: Jerene Bears, MD;  Location: [facility] ENDOSCOPY;  Service: Endoscopy;  Laterality: N/A;  . FUSION OF TALONAVICULAR JOINT Right 04/15/2014   dr duda  . HERNIA REPAIR     umbilical  . INGUINAL HERNIA REPAIR Right 05/11/2015   Procedure: LAPAROSCOPIC REPAIR RIGHT  INGUINAL HERNIA;  Surgeon: Greer Pickerel, MD;  Location: [place];  Service: General;  Laterality: Right;  . INSERTION OF MESH Right 05/11/2015   Procedure: INSERTION OF MESH;  Surgeon: Greer Pickerel, MD;  Location: [place];  Service: General;  Laterality: Right;  . JOINT REPLACEMENT Bilateral    knees  . KNEE CARTILAGE  SURGERY Right X 12   "~ 1/2 open; ~ 1/2 scopes"  . KNEE CARTILAGE SURGERY Left X 3   "3 scopes"  . LEFT HEART CATHETERIZATION WITH CORONARY ANGIOGRAM N/A 02/10/2013   Procedure: LEFT HEART CATHETERIZATION WITH CORONARY ANGIOGRAM;  Surgeon: Burnell Blanks, MD;  Location[facility] CATH LAB;  Service: Cardiovascular;  Laterality: N/A;  . LUMBAR LAMINECTOMY/DECOMPRESSION MICRODISCECTOMY N/A 01/27/2014   Procedure: CENTRAL LUMBAR LAMINECTOMY L4-5 AND L3-4;  Surgeon: Jessy Oto, MD;  Location: [place];  Service: Orthopedics;  Laterality: N/A;  . ORIF ANKLE FRACTURE Right 09/02/2015   Procedure: OPEN REDUCTION INTERNAL FIXATION (ORIF) ANKLE FRACTURE;  Surgeon: Newt Minion, MD;  Location: [place];  Service: Orthopedics;  Laterality: Right;  . PERIPHERALLY INSERTED CENTRAL CATHETER INSERTION  09/02/2015  . SHOULDER ARTHROSCOPY W/ ROTATOR CUFF REPAIR Bilateral    "3 on the right;  1 on the left"  . SKIN SPLIT GRAFT Right 10/01/2015   Procedure: RIGHT ANKLE APPLY SKIN GRAFT SPLIT THICKNESS;  Surgeon: Newt Minion, MD;  Location: [place];  Service: Orthopedics;  Laterality: Right;  . TONSILLECTOMY    . TOTAL KNEE ARTHROPLASTY Bilateral 2008  . UMBILICAL HERNIA REPAIR     UHR  . URETHRAL DILATION  X 4  . VASECTOMY      reports that he quit smoking about 6 years ago. His smoking use included Cigars. He has never used smokeless tobacco. He reports that he does not drink alcohol or use drugs. family history includes Cancer in his father and mother; Coronary artery disease in his other; Depression in his brother, mother, and other; Diabetes in his father; Early death in his paternal grandfather; Early death (age of onset: 46) in his maternal grandfather; Heart disease in his father, maternal grandfather, and mother; Hyperlipidemia in his father; Hypertension in his brother, father, mother, and other. No Known Allergies Current Outpatient Prescriptions on File Prior to Visit  Medication Sig Dispense Refill  .  AMITIZA 24 MCG capsule Take 24 mcg by mouth 2 (two) times daily with a meal.     . ARIPiprazole (ABILIFY) 5 MG tablet Take 1 tablet (5 mg total) by mouth daily. Take one tablet at bedtime 90 tablet 0  . aspirin 81 MG tablet Take 81 mg by mouth daily.    [name] atorvastatin (LIPITOR) 20 MG tablet TAKE 1 TABLET BY MOUTH EVERY DAY 90 tablet 3  . benztropine (COGENTIN) 0.5 MG tablet Take 1 tablet (0.5 mg total) by mouth at bedtime. 90 tablet 0  . FLUoxetine (PROZAC) 40 MG capsule TAKE 1 CAPSULE(40 MG) BY MOUTH DAILY 90 capsule 0  . LYRICA 100 MG capsule Take 1 capsule (100 mg total) by mouth 3 (three) times daily. 90 capsule 2  . metFORMIN (GLUCOPHAGE) 500 MG tablet Take 1 tablet (500 mg total) by mouth 2 (two) times daily with a meal. 180 tablet 3  . mirabegron ER (MYRBETRIQ) 25 MG TB24 tablet Take 25 mg by mouth daily.    . Omega-3 Fatty Acids (FISH OIL) 1000 MG CAPS Take 1,000 mg by mouth daily.     [name] oxyCODONE-acetaminophen (PERCOCET/ROXICET) 5-325 MG tablet Take 1 tablet by mouth 2 (two) times daily as needed. 60 tablet 0  . prasugrel (EFFIENT) 5 MG TABS tablet Take 1 tablet (5 mg total) by mouth daily. 30 tablet 3  . tamsulosin (FLOMAX) 0.4 MG CAPS capsule Take 0.4 mg by mouth daily after breakfast.     . traZODone (DESYREL) 150 MG tablet Take 1 tablet (150 mg total) by mouth at bedtime. 90 tablet 0   No current facility-administered medications on file prior to visit.    Review of Systems  Constitutional: Negative for other unusual diaphoresis or sweats HENT: Negative for ear discharge or swelling Eyes: Negative for other worsening visual disturbances Respiratory: Negative for stridor or other swelling  Gastrointestinal: Negative for worsening distension or other blood Genitourinary: Negative for retention or other urinary change Musculoskeletal: Negative for other MSK pain or swelling Skin: Negative for color change or other new lesions Neurological: Negative for worsening tremors and other  numbness  Psychiatric/Behavioral: Negative for worsening agitation or other fatigue All other system neg per pt    Objective:   Physical Exam BP (!) 142/86   Pulse 67   Ht 6' 1"  (1.854 m)   Wt 281 lb (127.5 kg)   SpO2 99%  BMI 37.07 kg/m  VS noted,  Constitutional: Pt appears in NAD HENT: Head: NCAT.  Right Ear: External ear normal.  Left Ear: External ear normal.  Eyes: . Pupils are equal, round, and reactive to light. Conjunctivae and EOM are normal Nose: without d/c or deformity Bilat tm's with mild erythema.  Max sinus areas mild tender.  Pharynx with mild erythema, no exudate Neck: Neck supple. Gross normal ROM Cardiovascular: Normal rate and regular rhythm.   Pulmonary/Chest: Effort normal and breath sounds without rales or wheezing.  Abd:  Soft, NT, ND, + BS, no organomegaly Neurological: Pt is alert. At baseline orientation, motor grossly intact Skin: Skin is warm. No rashes, + 1/4 cm raised nontender erythem lesion right tip of nosel no LE edema Psychiatric: Pt behavior is normal without agitation  No other exam findings Lab Results  Component Value Date   WBC 5.6 06/14/2016   HGB 11.2 (L) 06/14/2016   HCT 34.8 (L) 06/14/2016   PLT 250 06/14/2016   GLUCOSE 101 (H) 06/14/2016   CHOL 109 11/10/2015   TRIG 81.0 11/10/2015   HDL 41.60 11/10/2015   LDLCALC 51 11/10/2015   ALT 13 (L) 06/14/2016   AST 19 06/14/2016   NA 140 06/14/2016   K 3.8 06/14/2016   CL 111 06/14/2016   CREATININE 0.85 06/14/2016   BUN 14 06/14/2016   CO2 21 (L) 06/14/2016   TSH 2.56 11/10/2015   PSA 3.80 11/10/2015   INR 1.17 06/14/2016   HGBA1C 5.3 04/18/2016   MICROALBUR 12.4 (H) 11/10/2015       Assessment & Plan:

## 2016-10-20 NOTE — Patient Instructions (Signed)
You had the steroid shot today  Please take all new medication as prescribed - the antibiotic, prednisone, protonix for reflux, and cough medicine as needed  Please continue all other medications as before, and refills have been done if requested - the klonopin and nasacort for allergies  Please have the pharmacy call with any other refills you may need.  Please continue your efforts at being more active, low cholesterol diet, and weight control.  Please keep your appointments with your specialists as you may have planned  You will be contacted regarding the referral for: Dermatology  Please go to the LAB in the Basement (turn left off the elevator) for the tests to be done today  You will be contacted by phone if any changes need to be made immediately.  Otherwise, you will receive a letter about your results with an explanation, but please check with MyChart first.  Please remember to sign up for MyChart if you have not done so, as this will be important to you in the future with finding out test results, communicating by private email, and scheduling acute appointments online when needed.  Please return in 6 months, or sooner if needed, with Lab testing done 3-5 days before

## 2016-10-22 NOTE — Assessment & Plan Note (Signed)
Mild to mod flare, for predpac asd,  to f/u any worsening symptoms or concerns

## 2016-10-22 NOTE — Assessment & Plan Note (Signed)
Recent onset, suspicious for basal cell ca, for derm referral

## 2016-10-22 NOTE — Assessment & Plan Note (Signed)
Asympt, diet controlled,  to f/u any worsening symptoms or concerns

## 2016-10-22 NOTE — Assessment & Plan Note (Signed)
Mild to mod, for antibx course,  to f/u any worsening symptoms or concerns 

## 2016-10-22 NOTE — Assessment & Plan Note (Signed)
Mod worse, for retart PPI - protonix 40 qd

## 2016-11-01 ENCOUNTER — Telehealth (INDEPENDENT_AMBULATORY_CARE_PROVIDER_SITE_OTHER): Payer: Self-pay | Admitting: Orthopedic Surgery

## 2016-11-01 NOTE — Telephone Encounter (Signed)
Patient called asking for a definitive prosthetic leg RX. CB # [phone]

## 2016-11-02 ENCOUNTER — Ambulatory Visit (INDEPENDENT_AMBULATORY_CARE_PROVIDER_SITE_OTHER): Payer: Medicare PPO | Admitting: Specialist

## 2016-11-03 ENCOUNTER — Ambulatory Visit (INDEPENDENT_AMBULATORY_CARE_PROVIDER_SITE_OTHER): Payer: Medicare PPO | Admitting: Orthopedic Surgery

## 2016-11-07 ENCOUNTER — Telehealth (INDEPENDENT_AMBULATORY_CARE_PROVIDER_SITE_OTHER): Payer: Self-pay | Admitting: Specialist

## 2016-11-07 NOTE — Telephone Encounter (Signed)
Returned call to patient left voicemail message to return call  [phone]

## 2016-11-09 ENCOUNTER — Ambulatory Visit: Payer: Medicare HMO | Admitting: Internal Medicine

## 2016-11-14 ENCOUNTER — Encounter: Payer: Self-pay | Admitting: Internal Medicine

## 2016-11-14 ENCOUNTER — Ambulatory Visit (INDEPENDENT_AMBULATORY_CARE_PROVIDER_SITE_OTHER): Payer: Medicare HMO | Admitting: Internal Medicine

## 2016-11-14 VITALS — BP 134/86 | HR 77 | Ht 73.0 in | Wt 280.0 lb

## 2016-11-14 DIAGNOSIS — G8929 Other chronic pain: Secondary | ICD-10-CM | POA: Diagnosis not present

## 2016-11-14 DIAGNOSIS — Z89511 Acquired absence of right leg below knee: Secondary | ICD-10-CM

## 2016-11-14 DIAGNOSIS — G894 Chronic pain syndrome: Secondary | ICD-10-CM | POA: Diagnosis not present

## 2016-11-14 DIAGNOSIS — J019 Acute sinusitis, unspecified: Secondary | ICD-10-CM

## 2016-11-14 DIAGNOSIS — I10 Essential (primary) hypertension: Secondary | ICD-10-CM

## 2016-11-14 MED ORDER — AZITHROMYCIN 250 MG PO TABS: ORAL_TABLET | ORAL | 1 refills | Status: DC

## 2016-11-14 MED ORDER — CYCLOBENZAPRINE HCL 5 MG PO TABS: 5.0000 mg | ORAL_TABLET | Freq: Three times a day (TID) | ORAL | 1 refills | Status: DC | PRN

## 2016-11-14 NOTE — Progress Notes (Signed)
Subjective:    Patient ID: Travis Fortis Sr., male    DOB: 1950-08-08, 66 y.o.   MRN: [id number]  HPI   Here with 2-3 days acute onset fever, facial pain, pressure, headache, general weakness and malaise, and greenish d/c, with mild ST and cough, but pt denies chest pain, wheezing, increased sob or doe, orthopnea, PND, increased LE swelling, palpitations, dizziness or syncope.   Pt denies polydipsia, polyuria.  Pt continues to have recurring LBP without change in severity, bowel or bladder change, fever, wt loss,  worsening LE pain/numbness/weakness, gait change or falls, asks for flexeril refill.  Will need new pain management referral and rx for Prosthetic socket to s/p right BKA.  Also mentions has once weekly fecal incontinence watery for several wks, wondering about lactose intolerance Past Medical History:  Diagnosis Date  . ALLERGIC RHINITIS 06/24/2009  . Allergic rhinitis 06/24/2009   Qualifier: Diagnosis of  By: [name], [name]   . Anxiety 11/12/2011   Adequate for discharge   . Arthritis    "all my joints" (09/30/2013)  . Arthritis of foot, right, degenerative 04/15/2014  . Balance disorder 03/12/2013  . Benign neoplasm of cecum   . Benign neoplasm of descending colon   . CAD (coronary artery disease) 06/24/2009   5 stents placed in 2007    . Chronic anticoagulation   . Chronic pain syndrome 10/27/2009   of ankle, shoulders, low back.  sciatica.   . Closed fracture of right foot 10/17/2014  . CORONARY ARTERY DISEASE 06/24/2009   a. s/p multiple PCIs - In 2008 he had a Taxus DES to the mild LAD, Endeavor DES to mid LCX and distal LCX. In January 2009 he had DES to distal LCX, mid LCX and proximal LCX. In November 2009 had BMS x 2 to the mid RCA. Cath 10/2011 with patent stents, noncardiac CP. LHC 01/2013: patent stents (noncardiac CP).  . DEGENERATIVE JOINT DISEASE 06/24/2009   Qualifier: Diagnosis of  By: [name], [name]   . Depression   . Depression with anxiety    Prior suicide  attempt  . [place] DISEASE, LUMBAR 04/19/2010  . ERECTILE DYSFUNCTION, ORGANIC 05/30/2010  . Essential hypertension 06/24/2009   Qualifier: Diagnosis of  By: [name], [name]   . Fibromyalgia   . Fracture dislocation of ankle joint 09/02/2015  . Gait disorder 03/12/2013  . General weakness 07/14/2014  . GERD (gastroesophageal reflux disease) 09/08/2015  . Hand joint pain 06/10/2013  . Heart murmur   . Hepatitis C   . History of kidney stones   . Hyperlipidemia 07/15/2009   Qualifier: Diagnosis of  By: Aundra Dubin, MD, [place]    . HYPERLIPIDEMIA-MIXED 07/15/2009  . HYPERTENSION 06/24/2009  . Insomnia 10/04/2011  . Irregular heart beat   . Left hip pain 03/12/2013   Injected under ultrasound guidance on June 24, 2013   . Major depression 09/13/2015  . Myocardial infarction ([place]) 2008  . Non-cardiac chest pain 10/2011, 01/2013  . Obesity   . Occult blood positive stool 10/17/2014  . Open ankle fracture 09/02/2015  . OSA (obstructive sleep apnea)    not using CPAP (09/30/2013)  . Pain of right thumb 04/03/2013  . Pneumonia   . PPD positive 04/08/2015  . Pre-ulcerative corn or callous 02/06/2013  . Restless legs   . Rotator cuff tear arthropathy of both shoulders 06/10/2013   History of bilateral shoulder cuff surgery for rotator cuff tears. Reports increase in pain 09/11/2015 during physical therapy of  the left shoulder.   [name] SCIATICA, LEFT 04/19/2010   Qualifier: Diagnosis of  By: [name], [name]   . Spinal stenosis in cervical region 09/26/2013  . Spinal stenosis, lumbar region, with neurogenic claudication 09/26/2013  . Type II diabetes mellitus ([place]) 2012   no meds in 09/2014.   [name] Uncontrolled type 2 DM with peripheral circulatory disorder ([place]) 10/04/2013  . URETHRAL STRICTURE 06/24/2009   self catheterizes.    Past Surgical History:  Procedure Laterality Date  . AMPUTATION Right 06/14/2016   Procedure: AMPUTATION BELOW KNEE;  Surgeon: Newt Minion, MD;  Location: [place];  Service: Orthopedics;   Laterality: Right;  . ANKLE FUSION Right 04/15/2014   Procedure: Right Subtalar, Talonavicular Fusion;  Surgeon: Newt Minion, MD;  Location: [place];  Service: Orthopedics;  Laterality: Right;  . ANKLE FUSION Right 04/18/2016   Right Ankle Tibiocalcaneal Fusion; Removal of deep retained hardware; application of wound vac/notes 04/18/2016  . ANKLE FUSION Right 04/18/2016   Procedure: Right Ankle Tibiocalcaneal Fusion;  Surgeon: Newt Minion, MD;  Location: [place];  Service: Orthopedics;  Laterality: Right;  . ANTERIOR CERVICAL DECOMP/DISCECTOMY FUSION N/A 09/26/2013   Procedure: ANTERIOR CERVICAL DISCECTOMY FUSION C3-4, plate and screw fixation, allograft bone graft;  Surgeon: Jessy Oto, MD;  Location: [place];  Service: Orthopedics;  Laterality: N/A;  . BACK SURGERY    . BELOW KNEE LEG AMPUTATION Right 06/14/2016  . CARDIAC CATHETERIZATION  X 1  . CARPAL TUNNEL RELEASE Bilateral   . COLONOSCOPY    . COLONOSCOPY N/A 10/22/2014   Procedure: COLONOSCOPY;  Surgeon: Lafayette Dragon, MD;  Location: [facility] ENDOSCOPY;  Service: Endoscopy;  Laterality: N/A;  . CORONARY ANGIOPLASTY WITH STENT PLACEMENT     "I have 9 stents"  . ESOPHAGOGASTRODUODENOSCOPY N/A 10/19/2014   Procedure: ESOPHAGOGASTRODUODENOSCOPY (EGD);  Surgeon: Jerene Bears, MD;  Location: [facility] ENDOSCOPY;  Service: Endoscopy;  Laterality: N/A;  . FUSION OF TALONAVICULAR JOINT Right 04/15/2014   dr duda  . HERNIA REPAIR     umbilical  . INGUINAL HERNIA REPAIR Right 05/11/2015   Procedure: LAPAROSCOPIC REPAIR RIGHT  INGUINAL HERNIA;  Surgeon: Greer Pickerel, MD;  Location: [place];  Service: General;  Laterality: Right;  . INSERTION OF MESH Right 05/11/2015   Procedure: INSERTION OF MESH;  Surgeon: Greer Pickerel, MD;  Location: [place];  Service: General;  Laterality: Right;  . JOINT REPLACEMENT Bilateral    knees  . KNEE CARTILAGE SURGERY Right X 12   "~ 1/2 open; ~ 1/2 scopes"  . KNEE CARTILAGE SURGERY Left X 3   "3 scopes"  . LEFT HEART  CATHETERIZATION WITH CORONARY ANGIOGRAM N/A 02/10/2013   Procedure: LEFT HEART CATHETERIZATION WITH CORONARY ANGIOGRAM;  Surgeon: Burnell Blanks, MD;  Location: [facility] CATH LAB;  Service: Cardiovascular;  Laterality: N/A;  . LUMBAR LAMINECTOMY/DECOMPRESSION MICRODISCECTOMY N/A 01/27/2014   Procedure: CENTRAL LUMBAR LAMINECTOMY L4-5 AND L3-4;  Surgeon: Jessy Oto, MD;  Location: [place];  Service: Orthopedics;  Laterality: N/A;  . ORIF ANKLE FRACTURE Right 09/02/2015   Procedure: OPEN REDUCTION INTERNAL FIXATION (ORIF) ANKLE FRACTURE;  Surgeon: Newt Minion, MD;  Location: [place];  Service: Orthopedics;  Laterality: Right;  . PERIPHERALLY INSERTED CENTRAL CATHETER INSERTION  09/02/2015  . SHOULDER ARTHROSCOPY W/ ROTATOR CUFF REPAIR Bilateral    "3 on the right; 1 on the left"  . SKIN SPLIT GRAFT Right 10/01/2015   Procedure: RIGHT ANKLE APPLY SKIN GRAFT SPLIT THICKNESS;  Surgeon: [name]  V, MD;  Location: [place];  Service: Orthopedics;  Laterality: Right;  . TONSILLECTOMY    . TOTAL KNEE ARTHROPLASTY Bilateral 2008  . UMBILICAL HERNIA REPAIR     UHR  . URETHRAL DILATION  X 4  . VASECTOMY      reports that he quit smoking about 6 years ago. His smoking use included Cigars. He has never used smokeless tobacco. He reports that he does not drink alcohol or use drugs. family history includes Cancer in his father and mother; Coronary artery disease in his other; Depression in his brother, mother, and other; Diabetes in his father; Early death in his paternal grandfather; Early death (age of onset: 9) in his maternal grandfather; Heart disease in his father, maternal grandfather, and mother; Hyperlipidemia in his father; Hypertension in his brother, father, mother, and other. No Known Allergies Current Outpatient Prescriptions on File Prior to Visit  Medication Sig Dispense Refill  . AMITIZA 24 MCG capsule Take 24 mcg by mouth 2 (two) times daily with a meal.     . ARIPiprazole (ABILIFY) 5 MG  tablet Take 1 tablet (5 mg total) by mouth daily. Take one tablet at bedtime 90 tablet 0  . aspirin 81 MG tablet Take 81 mg by mouth daily.    [name] atorvastatin (LIPITOR) 20 MG tablet TAKE 1 TABLET BY MOUTH EVERY DAY 90 tablet 3  . benztropine (COGENTIN) 0.5 MG tablet Take 1 tablet (0.5 mg total) by mouth at bedtime. 90 tablet 0  . clonazePAM (KLONOPIN) 1 MG tablet Take 1 tablet (1 mg total) by mouth at bedtime. 30 tablet 5  . FLUoxetine (PROZAC) 40 MG capsule TAKE 1 CAPSULE(40 MG) BY MOUTH DAILY 90 capsule 0  . LYRICA 100 MG capsule Take 1 capsule (100 mg total) by mouth 3 (three) times daily. 90 capsule 2  . metFORMIN (GLUCOPHAGE) 500 MG tablet Take 1 tablet (500 mg total) by mouth 2 (two) times daily with a meal. 180 tablet 3  . methocarbamol (ROBAXIN) 500 MG tablet Take 1 tablet (500 mg total) by mouth every 6 (six) hours as needed for muscle spasms. 60 tablet 5  . mirabegron ER (MYRBETRIQ) 25 MG TB24 tablet Take 25 mg by mouth daily.    . Omega-3 Fatty Acids (FISH OIL) 1000 MG CAPS Take 1,000 mg by mouth daily.     [name] oxyCODONE-acetaminophen (PERCOCET/ROXICET) 5-325 MG tablet Take 1 tablet by mouth 2 (two) times daily as needed. 60 tablet 0  . pantoprazole (PROTONIX) 40 MG tablet Take 1 tablet (40 mg total) by mouth daily. 90 tablet 3  . prasugrel (EFFIENT) 5 MG TABS tablet Take 1 tablet (5 mg total) by mouth daily. 30 tablet 3  . tamsulosin (FLOMAX) 0.4 MG CAPS capsule Take 0.4 mg by mouth daily after breakfast.     . traZODone (DESYREL) 150 MG tablet Take 1 tablet (150 mg total) by mouth at bedtime. 90 tablet 0  . triamcinolone (NASACORT AQ) 55 MCG/ACT AERO nasal inhaler Place 2 sprays into the nose daily. 1 Inhaler 12   No current facility-administered medications on file prior to visit.     Review of Systems  Constitutional: Negative for other unusual diaphoresis or sweats HENT: Negative for ear discharge or swelling Eyes: Negative for other worsening visual disturbances Respiratory:  Negative for stridor or other swelling  Gastrointestinal: Negative for worsening distension or other blood Genitourinary: Negative for retention or other urinary change Musculoskeletal: Negative for other MSK pain or swelling Skin: Negative for color change  or other new lesions Neurological: Negative for worsening tremors and other numbness  Psychiatric/Behavioral: Negative for worsening agitation or other fatigue All other system neg per pt    Objective:   Physical Exam BP 134/86   Pulse 77   Ht 6' 1"  (1.854 m)   Wt 280 lb (127 kg)   SpO2 98%   BMI 36.94 kg/m  VS noted, mild il appaering Constitutional: Pt appears in NAD HENT: Head: NCAT.  Right Ear: External ear normal.  Left Ear: External ear normal.  Eyes: . Pupils are equal, round, and reactive to light. Conjunctivae and EOM are normal Bilat tm's with mild erythema.  Max sinus areas mild tender.  Pharynx with mild erythema, no exudate Nose: without d/c or deformity Neck: Neck supple. Gross normal ROM Cardiovascular: Normal rate and regular rhythm.   Pulmonary/Chest: Effort normal and breath sounds without rales or wheezing.  Abd:  Soft, NT, ND, + BS, no organomegaly - benign exam Neurological: Pt is alert. At baseline orientation, motor grossly intact Skin: Skin is warm. No rashes, other new lesions, no LE edema Psychiatric: Pt behavior is normal without agitation  S/p Right BKA    Assessment & Plan:

## 2016-11-14 NOTE — Assessment & Plan Note (Signed)
stable overall by history and exam, recent data reviewed with pt, and pt to continue medical treatment as before,  to f/u any worsening symptoms or concerns BP Readings from Last 3 Encounters:  11/14/16 134/86  10/20/16 (!) 142/86  06/18/16 116/68

## 2016-11-14 NOTE — Assessment & Plan Note (Signed)
Ok for rx for prosthetic socket - done hardcopy to pt

## 2016-11-14 NOTE — Assessment & Plan Note (Signed)
[place] for new pain clinic referral

## 2016-11-14 NOTE — Patient Instructions (Signed)
Please take all new medication as prescribed - the antibiotic  Please continue all other medications as before, and refills have been done if requested - the flexeril  Please have the pharmacy call with any other refills you may need.  Please continue your efforts at being more active, low cholesterol diet, and weight control.  Please keep your appointments with your specialists as you may have planned  You will be contacted regarding the referral for: pain clinic

## 2016-11-14 NOTE — Assessment & Plan Note (Signed)
Mild to mod, for antibx course,  to f/u any worsening symptoms or concerns 

## 2016-11-23 ENCOUNTER — Telehealth: Payer: Self-pay | Admitting: Internal Medicine

## 2016-11-23 NOTE — Telephone Encounter (Signed)
Pt called checking on his Pain Management referral. I told him that it can take a little while for it to be completed but that I would let you know that he called. Thanks!

## 2016-11-24 NOTE — Telephone Encounter (Signed)
Referral faxed to [facility] [phone] Phone# [phone]

## 2016-12-05 ENCOUNTER — Ambulatory Visit (HOSPITAL_COMMUNITY): Payer: Self-pay | Admitting: Psychiatry

## 2016-12-06 ENCOUNTER — Other Ambulatory Visit: Payer: Self-pay | Admitting: Dermatology

## 2016-12-06 DIAGNOSIS — D229 Melanocytic nevi, unspecified: Secondary | ICD-10-CM | POA: Diagnosis not present

## 2016-12-06 DIAGNOSIS — C44311 Basal cell carcinoma of skin of nose: Secondary | ICD-10-CM | POA: Diagnosis not present

## 2016-12-06 DIAGNOSIS — L219 Seborrheic dermatitis, unspecified: Secondary | ICD-10-CM | POA: Diagnosis not present

## 2016-12-15 ENCOUNTER — Ambulatory Visit (INDEPENDENT_AMBULATORY_CARE_PROVIDER_SITE_OTHER): Payer: Medicare HMO | Admitting: Specialist

## 2016-12-15 ENCOUNTER — Ambulatory Visit (INDEPENDENT_AMBULATORY_CARE_PROVIDER_SITE_OTHER): Payer: Medicare HMO

## 2016-12-15 ENCOUNTER — Encounter (INDEPENDENT_AMBULATORY_CARE_PROVIDER_SITE_OTHER): Payer: Self-pay | Admitting: Specialist

## 2016-12-15 VITALS — BP 164/80 | HR 64 | Ht 72.0 in | Wt 300.0 lb

## 2016-12-15 DIAGNOSIS — M47816 Spondylosis without myelopathy or radiculopathy, lumbar region: Secondary | ICD-10-CM | POA: Diagnosis not present

## 2016-12-15 DIAGNOSIS — M545 Low back pain, unspecified: Secondary | ICD-10-CM

## 2016-12-15 DIAGNOSIS — M533 Sacrococcygeal disorders, not elsewhere classified: Secondary | ICD-10-CM | POA: Diagnosis not present

## 2016-12-15 DIAGNOSIS — G8929 Other chronic pain: Secondary | ICD-10-CM | POA: Diagnosis not present

## 2016-12-15 DIAGNOSIS — M1611 Unilateral primary osteoarthritis, right hip: Secondary | ICD-10-CM

## 2016-12-15 DIAGNOSIS — M2170 Unequal limb length (acquired), unspecified site: Secondary | ICD-10-CM | POA: Diagnosis not present

## 2016-12-15 MED ORDER — ACETAMINOPHEN-CODEINE #4 300-60 MG PO TABS: 1.0000 | ORAL_TABLET | ORAL | 0 refills | Status: DC | PRN

## 2016-12-15 NOTE — Patient Instructions (Addendum)
  Knee is suffering from osteoarthritis, only real proven treatments are Weight loss, medications like tylenol and exercise. Well padded shoes help. Ice the painfu areas 2-3 times a day 15-20 mins at a time. Dr.Newton's secretary will call to arrange for shots to decrease left joint arthritis pain. CT scanogram to assess the leg length difference with be scheduled.

## 2016-12-15 NOTE — Progress Notes (Signed)
Office Visit Note   Patient: Travis Rodier Sr.           Date of Birth: [birth year]           MRN: [id number] Visit Date: 12/15/2016              Requested by: [name], MD [place], [place] [address] PCP: [name], MD   Assessment & Plan: Visit Diagnoses:  1. Chronic low back pain without sciatica, unspecified back pain laterality   2. Acquired leg length discrepancy   3. Pain of left sacroiliac joint   4. Spondylosis without myelopathy or radiculopathy, lumbar region   5. Unilateral primary osteoarthritis, right hip   Complaining of severe left sided medial buttock pain and lumbosacral pain. He has a significant pelvic obliquity Due to shortened right LE.  His plain radiograph shows degenerative joint disease of the right hip. Spondylosis changes of the left L4-5 and left L5-S1 facets. I measured at least a 1 1/2 inch shortening of the right lower extremity. Motor is intact. Recommend facet and SI joint blocks with steroid injection to determine if these are the cause for his pain. He has been  Concentrating on his right leg, BKA has provided an excellent long term solution to a very difficult right ankle and foot condition  and now he is trying to be more mobile. He has gained weight which also contributes to increased  stresses on the left and right leg. Try a course of steroid injections, tylenol for antiinflammatory effect and tylenol #4 for more severe pain. He reports that [facility] is not seeing him, his insurance does not include their practice and they have not  prescribed oxycodone for him in 2 months. I explained to Mr. Deupree that I am not willing to prescribe the oxycodone long term and a referral to another pain management program that his insurance may accept is in order.   Plan:Hip is suffering from osteoarthritis, only real proven treatments are Weight loss, medications like tylenol and exercise. Well padded shoes  help. Ice the painfu areas 2-3 times a day 15-20 mins at a time. Dr.[place]'s secretary will call to arrange for shots to decrease left joint arthritis pain. CT scanogram to assess the leg length difference with be scheduled. Follow-Up Instructions: Return in about 4 weeks (around 01/12/2017).   Orders:  Orders Placed This Encounter  Procedures  . XR Lumbar Spine 2-3 Views  . Ambulatory referral to Physical Medicine Rehab   Meds ordered this encounter  Medications  . acetaminophen-codeine (TYLENOL #4) 300-60 MG tablet    Sig: Take 1 tablet by mouth every 4 (four) hours as needed for moderate pain or severe pain.    Dispense:  90 tablet    Refill:  0      Procedures: No procedures performed   Clinical Data: No additional findings.   Subjective: Chief Complaint  Patient presents with  . Lower Back - Pain    66 year old male s/p central laminectomies L3-4 and L4-5 for severe spinal stenosis. He did well with this. Has undergone in the interval right BKA for severe defomities associated with panovalgus foot and ankle with breakdown of the bone structure of the right foot and ankle. He is pleased with the right BKA but has been experiencing pain into the left low back over the past one year.  Pain is worse with standing and walking better with sitting a little bit.  Lying down to watch TV is with pain left lumbosacral. A comfortable spot is okay but moving increases the pain . Bowel and bladder is normal. Cough or sneeze is with pain. No numbness or tingling just pain left upper buttock and left lower lumbar. Bending, stooping riding in a car is painful. He is not taking taking anything oxycodone prior to that from [facility], Dr.Adams. Can not walk more than 150 feet. Hard to make it in from the car. Standing in one place is okay.     Review of Systems  Constitutional: Positive for activity change, appetite change, fatigue and unexpected weight change.  HENT:  Positive for sinus pain, sinus pressure and sneezing. Negative for congestion, drooling, ear discharge, ear pain and hearing loss.   Eyes: Negative.   Respiratory: Positive for cough and shortness of breath. Negative for chest tightness and wheezing.   Cardiovascular: Positive for leg swelling. Negative for chest pain and palpitations.  Gastrointestinal: Negative.  Negative for abdominal distention, abdominal pain, diarrhea, nausea and rectal pain.  Endocrine: Negative for cold intolerance and heat intolerance.  Genitourinary: Positive for frequency and urgency. Negative for difficulty urinating, dysuria, enuresis, flank pain and hematuria.  Musculoskeletal: Positive for arthralgias, back pain, gait problem, joint swelling, neck pain and neck stiffness.  Skin: Negative.  Negative for color change, pallor, rash and wound.  Allergic/Immunologic: Negative.   Neurological: Negative for dizziness, tremors, seizures, syncope, facial asymmetry, speech difficulty, weakness, light-headedness, numbness and headaches.  Hematological: Negative.  Negative for adenopathy. Does not bruise/bleed easily.  Psychiatric/Behavioral: Negative for agitation, behavioral problems, confusion, dysphoric mood, hallucinations, self-injury, sleep disturbance and suicidal ideas. The patient is not nervous/anxious and is not hyperactive.      Objective: Vital Signs: BP (!) 164/80 (BP Location: Left Arm, Patient Position: Sitting)   Pulse 64   Ht 6' (1.829 m)   Wt 300 lb (136.1 kg)   BMI 40.69 kg/m   Physical Exam  Back Exam   Tenderness  The patient is experiencing tenderness in the lumbar.  Other  Toe Walk: abnormal Heel Walk: abnormal Sensation: normal  Comments:  Right BKA prosthesis Postive figure four left hip and pain with IR right hip.      Specialty Comments:  No specialty comments available.  Imaging: No results found.   PMFS History: Patient Active Problem List   Diagnosis Date Noted  .  Spinal stenosis in cervical region 09/26/2013    Priority: High    Class: Chronic  . Spinal stenosis, lumbar region, with neurogenic claudication 09/26/2013    Priority: High    Class: Chronic  . Acute sinus infection 10/20/2016  . Hyperglycemia 10/20/2016  . Skin lesion 10/20/2016  . S/P unilateral BKA (below knee amputation), right ([place]) 06/14/2016  . Charcot foot due to diabetes mellitus ([place])   . Charcot's arthropathy associated with type 2 diabetes mellitus ([place]) 04/11/2016  . Encounter for well adult exam with abnormal findings 11/05/2015  . Major depression 09/13/2015  . S/P TKR (total knee replacement) bilaterally 09/13/2015  . GERD (gastroesophageal reflux disease) 09/08/2015  . S/P laparoscopic hernia repair 05/11/2015  . PPD positive 04/08/2015  . Benign neoplasm of descending colon   . Benign neoplasm of cecum   . AKI (acute kidney injury) ([place]) 10/18/2014  . Acute blood loss anemia   . Chronic anticoagulation   . Occult blood positive stool 10/17/2014  . General weakness 07/14/2014  . Urinary incontinence 07/14/2014  . Headache(784.0) 10/15/2013  . Hand joint pain  06/10/2013  . Rotator cuff tear arthropathy of both shoulders 06/10/2013  . Skin lesion of cheek 05/01/2013  . Pain of right thumb 04/03/2013  . Balance disorder 03/12/2013  . Gait disorder 03/12/2013  . Tremor 03/12/2013  . Left hip pain 03/12/2013  . Pre-ulcerative corn or callous 02/06/2013  . Anxiety 11/12/2011  . OSA (obstructive sleep apnea) 11/07/2011  . Bradycardia 10/20/2011  . Insomnia 10/04/2011  . Obesity 01/12/2011  . ERECTILE DYSFUNCTION, ORGANIC 05/30/2010  . [place] DISEASE, LUMBAR 04/19/2010  . SCIATICA, LEFT 04/19/2010  . Chronic pain syndrome 10/27/2009  . Hyperlipidemia 07/15/2009  . Essential hypertension 06/24/2009  . CAD (coronary artery disease) 06/24/2009  . Allergic rhinitis 06/24/2009  . URETHRAL STRICTURE 06/24/2009  . DEGENERATIVE JOINT DISEASE 06/24/2009  . SHOULDER  PAIN, BILATERAL 06/24/2009  . FATIGUE 06/24/2009  . NEPHROLITHIASIS, HX OF 06/24/2009   Past Medical History:  Diagnosis Date  . ALLERGIC RHINITIS 06/24/2009  . Allergic rhinitis 06/24/2009   Qualifier: Diagnosis of  By: [name], [name]   . Anxiety 11/12/2011   Adequate for discharge   . Arthritis    "all my joints" (09/30/2013)  . Arthritis of foot, right, degenerative 04/15/2014  . Balance disorder 03/12/2013  . Benign neoplasm of cecum   . Benign neoplasm of descending colon   . CAD (coronary artery disease) 06/24/2009   5 stents placed in 2007    . Chronic anticoagulation   . Chronic pain syndrome 10/27/2009   of ankle, shoulders, low back.  sciatica.   . Closed fracture of right foot 10/17/2014  . CORONARY ARTERY DISEASE 06/24/2009   a. s/p multiple PCIs - In 2008 he had a Taxus DES to the mild LAD, Endeavor DES to mid LCX and distal LCX. In January 2009 he had DES to distal LCX, mid LCX and proximal LCX. In November 2009 had BMS x 2 to the mid RCA. Cath 10/2011 with patent stents, noncardiac CP. LHC 01/2013: patent stents (noncardiac CP).  . DEGENERATIVE JOINT DISEASE 06/24/2009   Qualifier: Diagnosis of  By: [name], [name]   . Depression   . Depression with anxiety    Prior suicide attempt  . [place] DISEASE, LUMBAR 04/19/2010  . ERECTILE DYSFUNCTION, ORGANIC 05/30/2010  . Essential hypertension 06/24/2009   Qualifier: Diagnosis of  By: [name], [name]   . Fibromyalgia   . Fracture dislocation of ankle joint 09/02/2015  . Gait disorder 03/12/2013  . General weakness 07/14/2014  . GERD (gastroesophageal reflux disease) 09/08/2015  . Hand joint pain 06/10/2013  . Heart murmur   . Hepatitis C   . History of kidney stones   . Hyperlipidemia 07/15/2009   Qualifier: Diagnosis of  By: Aundra Dubin, MD, [place]    . HYPERLIPIDEMIA-MIXED 07/15/2009  . HYPERTENSION 06/24/2009  . Insomnia 10/04/2011  . Irregular heart beat   . Left hip pain 03/12/2013   Injected under ultrasound guidance on June 24, 2013   . Major depression 09/13/2015  . Myocardial infarction ([place]) 2008  . Non-cardiac chest pain 10/2011, 01/2013  . Obesity   . Occult blood positive stool 10/17/2014  . Open ankle fracture 09/02/2015  . OSA (obstructive sleep apnea)    not using CPAP (09/30/2013)  . Pain of right thumb 04/03/2013  . Pneumonia   . PPD positive 04/08/2015  . Pre-ulcerative corn or callous 02/06/2013  . Restless legs   . Rotator cuff tear arthropathy of both shoulders 06/10/2013   History of bilateral shoulder cuff surgery  for rotator cuff tears. Reports increase in pain 09/11/2015 during physical therapy of the left shoulder.   [name] SCIATICA, LEFT 04/19/2010   Qualifier: Diagnosis of  By: [name], [name]   . Spinal stenosis in cervical region 09/26/2013  . Spinal stenosis, lumbar region, with neurogenic claudication 09/26/2013  . Type II diabetes mellitus ([place]) 2012   no meds in 09/2014.   [name] Uncontrolled type 2 DM with peripheral circulatory disorder ([place]) 10/04/2013  . URETHRAL STRICTURE 06/24/2009   self catheterizes.     Family History  Problem Relation Age of Onset  . Depression Mother   . Heart disease Mother   . Hypertension Mother   . Cancer Mother        Breast  . Diabetes Father   . Heart disease Father        CABG  . Cancer Father        prostate and skin cancer  . Hypertension Father   . Hyperlipidemia Father   . Depression Brother        x 2  . Hypertension Brother        x2  . Heart disease Maternal Grandfather   . Early death Maternal Grandfather 48       heart attack  . Early death Paternal Grandfather   . Coronary artery disease Other   . Hypertension Other   . Depression Other     Past Surgical History:  Procedure Laterality Date  . AMPUTATION Right 06/14/2016   Procedure: AMPUTATION BELOW KNEE;  Surgeon: Newt Minion, MD;  Location: [facility];  Service: Orthopedics;  Laterality: Right;  . ANKLE FUSION Right 04/15/2014   Procedure: Right Subtalar, Talonavicular Fusion;  Surgeon:  Newt Minion, MD;  Location: [facility];  Service: Orthopedics;  Laterality: Right;  . ANKLE FUSION Right 04/18/2016   Right Ankle Tibiocalcaneal Fusion; Removal of deep retained hardware; application of wound vac/notes 04/18/2016  . ANKLE FUSION Right 04/18/2016   Procedure: Right Ankle Tibiocalcaneal Fusion;  Surgeon: Newt Minion, MD;  Location: [facility];  Service: Orthopedics;  Laterality: Right;  . ANTERIOR CERVICAL DECOMP/DISCECTOMY FUSION N/A 09/26/2013   Procedure: ANTERIOR CERVICAL DISCECTOMY FUSION C3-4, plate and screw fixation, allograft bone graft;  Surgeon: Jessy Oto, MD;  Location: [facility];  Service: Orthopedics;  Laterality: N/A;  . BACK SURGERY    . BELOW KNEE LEG AMPUTATION Right 06/14/2016  . CARDIAC CATHETERIZATION  X 1  . CARPAL TUNNEL RELEASE Bilateral   . COLONOSCOPY    . COLONOSCOPY N/A 10/22/2014   Procedure: COLONOSCOPY;  Surgeon: Lafayette Dragon, MD;  Location: [facility] ENDOSCOPY;  Service: Endoscopy;  Laterality: N/A;  . CORONARY ANGIOPLASTY WITH STENT PLACEMENT     "I have 9 stents"  . ESOPHAGOGASTRODUODENOSCOPY N/A 10/19/2014   Procedure: ESOPHAGOGASTRODUODENOSCOPY (EGD);  Surgeon: Jerene Bears, MD;  Location: [facility] ENDOSCOPY;  Service: Endoscopy;  Laterality: N/A;  . FUSION OF TALONAVICULAR JOINT Right 04/15/2014   dr duda  . HERNIA REPAIR     umbilical  . INGUINAL HERNIA REPAIR Right 05/11/2015   Procedure: LAPAROSCOPIC REPAIR RIGHT  INGUINAL HERNIA;  Surgeon: Greer Pickerel, MD;  Location: [facility];  Service: General;  Laterality: Right;  . INSERTION OF MESH Right 05/11/2015   Procedure: INSERTION OF MESH;  Surgeon: Greer Pickerel, MD;  Location: [facility];  Service: General;  Laterality: Right;  . JOINT REPLACEMENT Bilateral    knees  . KNEE CARTILAGE SURGERY Right X 12   "~ 1/2 open; ~  1/2 scopes"  . KNEE CARTILAGE SURGERY Left X 3   "3 scopes"  . LEFT HEART CATHETERIZATION WITH CORONARY ANGIOGRAM N/A 02/10/2013   Procedure: LEFT HEART CATHETERIZATION WITH CORONARY ANGIOGRAM;   Surgeon: Burnell Blanks, MD;  Location: [facility] CATH LAB;  Service: Cardiovascular;  Laterality: N/A;  . LUMBAR LAMINECTOMY/DECOMPRESSION MICRODISCECTOMY N/A 01/27/2014   Procedure: CENTRAL LUMBAR LAMINECTOMY L4-5 AND L3-4;  Surgeon: Jessy Oto, MD;  Location: [facility];  Service: Orthopedics;  Laterality: N/A;  . ORIF ANKLE FRACTURE Right 09/02/2015   Procedure: OPEN REDUCTION INTERNAL FIXATION (ORIF) ANKLE FRACTURE;  Surgeon: Newt Minion, MD;  Location: [facility];  Service: Orthopedics;  Laterality: Right;  . PERIPHERALLY INSERTED CENTRAL CATHETER INSERTION  09/02/2015  . SHOULDER ARTHROSCOPY W/ ROTATOR CUFF REPAIR Bilateral    "3 on the right; 1 on the left"  . SKIN SPLIT GRAFT Right 10/01/2015   Procedure: RIGHT ANKLE APPLY SKIN GRAFT SPLIT THICKNESS;  Surgeon: Newt Minion, MD;  Location: [facility];  Service: Orthopedics;  Laterality: Right;  . TONSILLECTOMY    . TOTAL KNEE ARTHROPLASTY Bilateral 2008  . UMBILICAL HERNIA REPAIR     UHR  . URETHRAL DILATION  X 4  . VASECTOMY     Social History   Occupational History  . disabled since 2006 due to ortho. heart, psych Unemployed  . part time work as an Multimedia programmer, wrestling, and baseball Agricultural consultant    Social History Main Topics  . Smoking status: Former Smoker    Types: Cigars    Quit date: 08/28/2010  . Smokeless tobacco: Never Used     Comment: 04/18/2016 "smoked 1 cigar/wk when I did smoke"  . Alcohol use No     Comment: rarely  . Drug use: No  . Sexual activity: Not Currently

## 2016-12-20 ENCOUNTER — Ambulatory Visit
Admission: RE | Admit: 2016-12-20 | Discharge: 2016-12-20 | Disposition: A | Discharge status: Home / Self Care | Payer: Medicare HMO | Source: Ambulatory Visit | Attending: Specialist | Admitting: Specialist

## 2016-12-20 DIAGNOSIS — M2170 Unequal limb length (acquired), unspecified site: Secondary | ICD-10-CM

## 2016-12-20 DIAGNOSIS — M545 Low back pain: Secondary | ICD-10-CM | POA: Diagnosis not present

## 2016-12-22 ENCOUNTER — Other Ambulatory Visit (INDEPENDENT_AMBULATORY_CARE_PROVIDER_SITE_OTHER): Payer: Self-pay | Admitting: Specialist

## 2016-12-26 ENCOUNTER — Other Ambulatory Visit: Payer: Self-pay | Admitting: Internal Medicine

## 2016-12-26 ENCOUNTER — Encounter (HOSPITAL_COMMUNITY): Payer: Self-pay | Admitting: Psychiatry

## 2016-12-26 ENCOUNTER — Ambulatory Visit (INDEPENDENT_AMBULATORY_CARE_PROVIDER_SITE_OTHER): Payer: Medicare HMO | Admitting: Psychiatry

## 2016-12-26 DIAGNOSIS — Z560 Unemployment, unspecified: Secondary | ICD-10-CM

## 2016-12-26 DIAGNOSIS — R635 Abnormal weight gain: Secondary | ICD-10-CM

## 2016-12-26 DIAGNOSIS — M2550 Pain in unspecified joint: Secondary | ICD-10-CM | POA: Diagnosis not present

## 2016-12-26 DIAGNOSIS — R69 Illness, unspecified: Secondary | ICD-10-CM | POA: Diagnosis not present

## 2016-12-26 DIAGNOSIS — Z736 Limitation of activities due to disability: Secondary | ICD-10-CM | POA: Diagnosis not present

## 2016-12-26 DIAGNOSIS — Z87891 Personal history of nicotine dependence: Secondary | ICD-10-CM | POA: Diagnosis not present

## 2016-12-26 DIAGNOSIS — F319 Bipolar disorder, unspecified: Secondary | ICD-10-CM

## 2016-12-26 DIAGNOSIS — Z818 Family history of other mental and behavioral disorders: Secondary | ICD-10-CM

## 2016-12-26 DIAGNOSIS — M549 Dorsalgia, unspecified: Secondary | ICD-10-CM | POA: Diagnosis not present

## 2016-12-26 MED ORDER — FLUOXETINE HCL 40 MG PO CAPS: ORAL_CAPSULE | ORAL | 0 refills | Status: DC

## 2016-12-26 MED ORDER — BENZTROPINE MESYLATE 0.5 MG PO TABS: 0.5000 mg | ORAL_TABLET | Freq: Every day | ORAL | 0 refills | Status: DC

## 2016-12-26 MED ORDER — TRAZODONE HCL 150 MG PO TABS: 150.0000 mg | ORAL_TABLET | Freq: Every day | ORAL | 0 refills | Status: DC

## 2016-12-26 MED ORDER — ARIPIPRAZOLE 5 MG PO TABS: 5.0000 mg | ORAL_TABLET | Freq: Every day | ORAL | 0 refills | Status: DC

## 2016-12-26 NOTE — Progress Notes (Signed)
BH MD/PA/NP OP Progress Note  12/26/2016 8:26 AM Travis Fortis Sr.  MRN:  [id number]  Chief Complaint:  Subjective:  I have a lot of back pain.  I'm feeling more depressed.  I'm gaining weight.  HPI: Travis [name] came for his follow-up appointment.  He's been complaining of back pain and he has difficulty walking and ambulation.  He is scheduled to see pain specialist on September 14.  He admitted due to pain he's been more depressed, isolated and withdrawn.  He is eating more and gain weight since the last visit.  Recently his physician also switched his Lyrica 2 gabapentin.  He is taking gabapentin 300 mg 3 times a day.  Patient believe he is realizing the amputation of his knee now.  He believed it is hurting him more because he lost the lambert.  But he is pleased that he does not have any persistent infection and numbness anymore.  He is sleeping better with trazodone.  He has difficulty walking because of back pain.  Denies any crying spells, irritability, anger, mania or any psychosis.  He does not want to change his medication.  He understands that it is a slow recovery and in full take time.  He is no longer using crutches but still need stick to help walking.  He realizes he has gained a lot of weight and started to have diet plan He is seeing primary care physician regularly.  He had blood work schedule and coming weeks.  Patient denies drinking alcohol or using any illegal substances.  His tremors are much better since we added Cogentin.  Patient lives with his niece who is very supportive.  Visit Diagnosis:    ICD-10-CM   1. Bipolar 1 disorder (HCC) F31.9 ARIPiprazole (ABILIFY) 5 MG tablet    FLUoxetine (PROZAC) 40 MG capsule    benztropine (COGENTIN) 0.5 MG tablet    Past Psychiatric History: Reviewed. Patient has history of bipolar disorder and he is seeing psychiatrists since age 6. He saw psychiatrist in [place] and [place]. He has history of severe mood swings , anger issues,  hallucination, irritability and poor impulse control and excessive spending. He married 5 times and he has 2 children but patient has no contact with them.He has one psychiatric hospitalization in 2014 when he took overdose on Xanax with alcohol. He had at least history of 2 suicidal gesture in the past. Patient also attended intensive outpatient program in the past. He had tried Effexor, Xanax, Klonopin, Ambien , Lunesta, Wellbutrin, Ambien and Remeron. He was started on Abilify from this office.   Past Medical History:  Past Medical History:  Diagnosis Date  . ALLERGIC RHINITIS 06/24/2009  . Allergic rhinitis 06/24/2009   Qualifier: Diagnosis of  By: [name], [name]   . Anxiety 11/12/2011   Adequate for discharge   . Arthritis    "all my joints" (09/30/2013)  . Arthritis of foot, right, degenerative 04/15/2014  . Balance disorder 03/12/2013  . Benign neoplasm of cecum   . Benign neoplasm of descending colon   . CAD (coronary artery disease) 06/24/2009   5 stents placed in 2007    . Chronic anticoagulation   . Chronic pain syndrome 10/27/2009   of ankle, shoulders, low back.  sciatica.   . Closed fracture of right foot 10/17/2014  . CORONARY ARTERY DISEASE 06/24/2009   a. s/p multiple PCIs - In 2008 he had a Taxus DES to the mild LAD, Endeavor DES to mid LCX and distal LCX. In  January 2009 he had DES to distal LCX, mid LCX and proximal LCX. In November 2009 had BMS x 2 to the mid RCA. Cath 10/2011 with patent stents, noncardiac CP. LHC 01/2013: patent stents (noncardiac CP).  . DEGENERATIVE JOINT DISEASE 06/24/2009   Qualifier: Diagnosis of  By: [name], [name]   . Depression   . Depression with anxiety    Prior suicide attempt  . [place] DISEASE, LUMBAR 04/19/2010  . ERECTILE DYSFUNCTION, ORGANIC 05/30/2010  . Essential hypertension 06/24/2009   Qualifier: Diagnosis of  By: [name], [name]   . Fibromyalgia   . Fracture dislocation of ankle joint 09/02/2015  . Gait disorder 03/12/2013  . General  weakness 07/14/2014  . GERD (gastroesophageal reflux disease) 09/08/2015  . Hand joint pain 06/10/2013  . Heart murmur   . Hepatitis C   . History of kidney stones   . Hyperlipidemia 07/15/2009   Qualifier: Diagnosis of  By: Aundra Dubin, MD, [place]    . HYPERLIPIDEMIA-MIXED 07/15/2009  . HYPERTENSION 06/24/2009  . Insomnia 10/04/2011  . Irregular heart beat   . Left hip pain 03/12/2013   Injected under ultrasound guidance on June 24, 2013   . Major depression 09/13/2015  . Myocardial infarction ([place]) 2008  . Non-cardiac chest pain 10/2011, 01/2013  . Obesity   . Occult blood positive stool 10/17/2014  . Open ankle fracture 09/02/2015  . OSA (obstructive sleep apnea)    not using CPAP (09/30/2013)  . Pain of right thumb 04/03/2013  . Pneumonia   . PPD positive 04/08/2015  . Pre-ulcerative corn or callous 02/06/2013  . Restless legs   . Rotator cuff tear arthropathy of both shoulders 06/10/2013   History of bilateral shoulder cuff surgery for rotator cuff tears. Reports increase in pain 09/11/2015 during physical therapy of the left shoulder.   [name] SCIATICA, LEFT 04/19/2010   Qualifier: Diagnosis of  By: [name], [name]   . Spinal stenosis in cervical region 09/26/2013  . Spinal stenosis, lumbar region, with neurogenic claudication 09/26/2013  . Type II diabetes mellitus ([place]) 2012   no meds in 09/2014.   [name] Uncontrolled type 2 DM with peripheral circulatory disorder ([place]) 10/04/2013  . URETHRAL STRICTURE 06/24/2009   self catheterizes.     Past Surgical History:  Procedure Laterality Date  . AMPUTATION Right 06/14/2016   Procedure: AMPUTATION BELOW KNEE;  Surgeon: Newt Minion, MD;  Location: [facility];  Service: Orthopedics;  Laterality: Right;  . ANKLE FUSION Right 04/15/2014   Procedure: Right Subtalar, Talonavicular Fusion;  Surgeon: Newt Minion, MD;  Location: [facility];  Service: Orthopedics;  Laterality: Right;  . ANKLE FUSION Right 04/18/2016   Right Ankle Tibiocalcaneal Fusion; Removal of deep  retained hardware; application of wound vac/notes 04/18/2016  . ANKLE FUSION Right 04/18/2016   Procedure: Right Ankle Tibiocalcaneal Fusion;  Surgeon: Newt Minion, MD;  Location: [facility];  Service: Orthopedics;  Laterality: Right;  . ANTERIOR CERVICAL DECOMP/DISCECTOMY FUSION N/A 09/26/2013   Procedure: ANTERIOR CERVICAL DISCECTOMY FUSION C3-4, plate and screw fixation, allograft bone graft;  Surgeon: Jessy Oto, MD;  Location: [facility];  Service: Orthopedics;  Laterality: N/A;  . BACK SURGERY    . BELOW KNEE LEG AMPUTATION Right 06/14/2016  . CARDIAC CATHETERIZATION  X 1  . CARPAL TUNNEL RELEASE Bilateral   . COLONOSCOPY    . COLONOSCOPY N/A 10/22/2014   Procedure: COLONOSCOPY;  Surgeon: Lafayette Dragon, MD;  Location: [facility] ENDOSCOPY;  Service: Endoscopy;  Laterality:  N/A;  . CORONARY ANGIOPLASTY WITH STENT PLACEMENT     "I have 9 stents"  . ESOPHAGOGASTRODUODENOSCOPY N/A 10/19/2014   Procedure: ESOPHAGOGASTRODUODENOSCOPY (EGD);  Surgeon: Jerene Bears, MD;  Location: [facility] ENDOSCOPY;  Service: Endoscopy;  Laterality: N/A;  . FUSION OF TALONAVICULAR JOINT Right 04/15/2014   dr duda  . HERNIA REPAIR     umbilical  . INGUINAL HERNIA REPAIR Right 05/11/2015   Procedure: LAPAROSCOPIC REPAIR RIGHT  INGUINAL HERNIA;  Surgeon: Greer Pickerel, MD;  Location: [facility];  Service: General;  Laterality: Right;  . INSERTION OF MESH Right 05/11/2015   Procedure: INSERTION OF MESH;  Surgeon: Greer Pickerel, MD;  Location: [place];  Service: General;  Laterality: Right;  . JOINT REPLACEMENT Bilateral    knees  . KNEE CARTILAGE SURGERY Right X 12   "~ 1/2 open; ~ 1/2 scopes"  . KNEE CARTILAGE SURGERY Left X 3   "3 scopes"  . LEFT HEART CATHETERIZATION WITH CORONARY ANGIOGRAM N/A 02/10/2013   Procedure: LEFT HEART CATHETERIZATION WITH CORONARY ANGIOGRAM;  Surgeon: Burnell Blanks, MD;  Location: [facility] CATH LAB;  Service: Cardiovascular;  Laterality: N/A;  . LUMBAR LAMINECTOMY/DECOMPRESSION MICRODISCECTOMY N/A 01/27/2014    Procedure: CENTRAL LUMBAR LAMINECTOMY L4-5 AND L3-4;  Surgeon: Jessy Oto, MD;  Location: [place];  Service: Orthopedics;  Laterality: N/A;  . ORIF ANKLE FRACTURE Right 09/02/2015   Procedure: OPEN REDUCTION INTERNAL FIXATION (ORIF) ANKLE FRACTURE;  Surgeon: Newt Minion, MD;  Location: [place];  Service: Orthopedics;  Laterality: Right;  . PERIPHERALLY INSERTED CENTRAL CATHETER INSERTION  09/02/2015  . SHOULDER ARTHROSCOPY W/ ROTATOR CUFF REPAIR Bilateral    "3 on the right; 1 on the left"  . SKIN SPLIT GRAFT Right 10/01/2015   Procedure: RIGHT ANKLE APPLY SKIN GRAFT SPLIT THICKNESS;  Surgeon: Newt Minion, MD;  Location: [facility];  Service: Orthopedics;  Laterality: Right;  . TONSILLECTOMY    . TOTAL KNEE ARTHROPLASTY Bilateral 2008  . UMBILICAL HERNIA REPAIR     UHR  . URETHRAL DILATION  X 4  . VASECTOMY      Family Psychiatric History: Reviewed.  Family History:  Family History  Problem Relation Age of Onset  . Depression Mother   . Heart disease Mother   . Hypertension Mother   . Cancer Mother        Breast  . Diabetes Father   . Heart disease Father        CABG  . Cancer Father        prostate and skin cancer  . Hypertension Father   . Hyperlipidemia Father   . Depression Brother        x 2  . Hypertension Brother        x2  . Heart disease Maternal Grandfather   . Early death Maternal Grandfather 68       heart attack  . Early death Paternal Grandfather   . Coronary artery disease Other   . Hypertension Other   . Depression Other     Social History:  Social History   Social History  . Marital status: Divorced    Spouse name: N/A  . Number of children: 2  . Years of education: N/A   Occupational History  . disabled since 2006 due to ortho. heart, psych Unemployed  . part time work as an Multimedia programmer, wrestling, and baseball Agricultural consultant    Social History Main Topics  . Smoking status: Former Smoker    Types:  Cigars    Quit date:  08/28/2010  . Smokeless tobacco: Never Used     Comment: 04/18/2016 "smoked 1 cigar/wk when I did smoke"  . Alcohol use No     Comment: rarely  . Drug use: No  . Sexual activity: Not Currently   Other Topics Concern  . None   Social History Narrative   Played semi-pro football, used steroids   Divorced moved here from [place], [place]   Patient states he has been on disability since his knee surgery.      03/05/2013 AHW "[name]" was born and grew up in [place], [place], and moved to [place], [place] at age 45. He has one sister and 3 brothers. He reports that his childhood was rough. His parents are deceased. He graduated from [employer], and attended one year of college. He is currently unemployed and on disability for multiple medical problems. He has been married 5 times. He has 2 sons. He currently lives alone. 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 He denies that he has any social support system. 03/05/2013 AHW             Allergies: No Known Allergies  Metabolic Disorder Labs: No results found for this or any previous visit (from the past 2160 hour(s)). Lab Results  Component Value Date   HGBA1C 5.3 04/18/2016   MPG 105 04/18/2016   MPG 114 09/26/2013   No results found for: PROLACTIN Lab Results  Component Value Date   CHOL 109 11/10/2015   TRIG 81.0 11/10/2015   HDL 41.60 11/10/2015   CHOLHDL 3 11/10/2015   VLDL 16.2 11/10/2015   LDLCALC 51 11/10/2015   LDLCALC 51 03/12/2015     Current Medications: Current Outpatient Prescriptions  Medication Sig Dispense Refill  . acetaminophen-codeine (TYLENOL #4) 300-60 MG tablet Take 1 tablet by mouth every 4 (four) hours as needed for moderate pain or severe pain. 90 tablet 0  . ARIPiprazole (ABILIFY) 5 MG tablet Take 1 tablet (5 mg total) by mouth daily. Take one tablet at bedtime 90 tablet 0  . aspirin 81 MG tablet Take 81 mg by mouth daily.    [name] atorvastatin (LIPITOR) 20 MG  tablet TAKE 1 TABLET BY MOUTH EVERY DAY 90 tablet 3  . benztropine (COGENTIN) 0.5 MG tablet Take 1 tablet (0.5 mg total) by mouth at bedtime. 90 tablet 0  . clonazePAM (KLONOPIN) 1 MG tablet Take 1 tablet (1 mg total) by mouth at bedtime. 30 tablet 5  . cyclobenzaprine (FLEXERIL) 5 MG tablet Take 1 tablet (5 mg total) by mouth 3 (three) times daily as needed for muscle spasms. 60 tablet 1  . FLUoxetine (PROZAC) 40 MG capsule TAKE 1 CAPSULE(40 MG) BY MOUTH DAILY 90 capsule 0  . LYRICA 100 MG capsule Take 1 capsule (100 mg total) by mouth 3 (three) times daily. 90 capsule 2  . metFORMIN (GLUCOPHAGE) 500 MG tablet Take 1 tablet (500 mg total) by mouth 2 (two) times daily with a meal. 180 tablet 3  . mirabegron ER (MYRBETRIQ) 25 MG TB24 tablet Take 25 mg by mouth daily.    . Omega-3 Fatty Acids (FISH OIL) 1000 MG CAPS Take 1,000 mg by mouth daily.     . pantoprazole (PROTONIX) 40 MG tablet Take 1 tablet (40 mg total) by mouth daily. 90 tablet 3  . prasugrel (EFFIENT) 5 MG TABS tablet Take 1 tablet (5 mg total) by mouth daily. 30 tablet 3  .  tamsulosin (FLOMAX) 0.4 MG CAPS capsule Take 0.4 mg by mouth daily after breakfast.     . traZODone (DESYREL) 150 MG tablet Take 1 tablet (150 mg total) by mouth at bedtime. 90 tablet 0  . triamcinolone (NASACORT AQ) 55 MCG/ACT AERO nasal inhaler Place 2 sprays into the nose daily. 1 Inhaler 12   No current facility-administered medications for this visit.     Neurologic: Headache: No Seizure: No Paresthesias: Yes  Musculoskeletal: Strength & Muscle Tone: decreased Gait & Station: unsteady Patient leans: Left  Psychiatric Specialty Exam: Review of Systems  Constitutional: Negative for weight loss.  HENT: Negative.   Respiratory: Negative.   Cardiovascular: Negative.   Musculoskeletal: Positive for back pain and joint pain.       Below-knee amputation  Skin: Negative.   Neurological: Positive for tingling.  Psychiatric/Behavioral: The patient is  nervous/anxious.     Blood pressure (!) 148/80, pulse 67, height 6' 1"  (1.854 m), weight (!) 318 lb 6.4 oz (144.4 kg).Body mass index is 42.01 kg/m.  General Appearance: Casual and Obese  Eye Contact:  Fair  Speech:  Slow  Volume:  Normal  Mood:  Angry  Affect:  Congruent  Thought Process:  Goal Directed  Orientation:  Full (Time, Place, and Person)  Thought Content: Rumination   Suicidal Thoughts:  No  Homicidal Thoughts:  No  Memory:  Immediate;   Fair Recent;   Good Remote;   Good  Judgement:  Good  Insight:  Good  Psychomotor Activity:  Decreased  Concentration:  Concentration: Fair and Attention Span: Fair  Recall:  [employer] of Knowledge: Fair  Language: Good  Akathisia:  No  Handed:  Right  AIMS (if indicated):  0  Assets:  Communication Skills Desire for Improvement Resilience Social Support  ADL's:  Intact  Cognition: WNL  Sleep:  Good    Assessment: Bipolar disorder type I.  Plan: I reviewed records from other provider, recent visits and collateral information.  Patient has gained a lot of weight from the last visit.  He admitted feeling more isolated, withdrawn and sad due to his limitation after knee amputation.  He is scheduled to see a pain specialist on September 14.  Recently his physician switched from Lyrica to gabapentin.  He is hoping that pain may get better.  He does not want to change his psychiatric medication.  He will start died program soon.  He is also taking Klonopin from his primary care physician.  Discuss in detail medication side effects and benefits.  I will continue trazodone 150 mg at bedtime, Prozac 40 mg daily, Abilify 5 mg daily and Cogentin 0.5 mg at bedtime.  He is not interested in counseling.  Discuss safety plan that anytime having active suicidal thoughts or homicidal thoughts and he need to call 911 or go to the local emergency room.  Follow-up in 3 months.  Time spent 25 minutes.   Aeris Hersman T., MD 12/26/2016, 8:26 AM

## 2016-12-28 ENCOUNTER — Telehealth (INDEPENDENT_AMBULATORY_CARE_PROVIDER_SITE_OTHER): Payer: Self-pay | Admitting: Orthopedic Surgery

## 2016-12-28 NOTE — Telephone Encounter (Signed)
Called patient left message Dr Sharol Given will not be in the office   Need to R/S appointment

## 2016-12-29 ENCOUNTER — Ambulatory Visit (INDEPENDENT_AMBULATORY_CARE_PROVIDER_SITE_OTHER): Payer: Medicare HMO | Admitting: Orthopedic Surgery

## 2016-12-29 ENCOUNTER — Encounter (INDEPENDENT_AMBULATORY_CARE_PROVIDER_SITE_OTHER): Payer: Self-pay | Admitting: Orthopedic Surgery

## 2016-12-29 DIAGNOSIS — Z89511 Acquired absence of right leg below knee: Secondary | ICD-10-CM

## 2016-12-29 NOTE — Progress Notes (Signed)
Office Visit Note   Patient: Travis Lopes Sr.           Date of Birth: [birth year]           MRN: [id number] Visit Date: 12/29/2016              Requested by: [name], MD [place], [place] [address] PCP: [name], MD  Chief Complaint  Patient presents with  . Right Leg - Follow-up      HPI: The patient is a 66 year old gentleman who presents today him for evaluation for new socket on the right. He status post right below the knee amputation. He has had considerable volume loss is currently having to use 15 ply daily. Is following with [facility] for prosthetics for his prosthetic needs.   Assessment & Plan: Visit Diagnoses:  1. S/P unilateral BKA (below knee amputation), right ([place])     Plan: Provided an order for new socket. Follow-up in office as needed.  Follow-Up Instructions: Return if symptoms worsen or fail to improve.   Ortho Exam  Patient is alert, oriented, no adenopathy, well-dressed, normal affect, normal respiratory effort. Right residual limb is well consolidated well healed there is no buildup of callus no erythema no impending breakdown.  Imaging: No results found. @encimg @  Labs: Lab Results  Component Value Date   HGBA1C 5.3 04/18/2016   HGBA1C 5.2 11/10/2015   HGBA1C 5.4 03/12/2015   ESRSEDRATE 20 (H) 09/03/2013   ESRSEDRATE 11 03/25/2013   CRP 10.7 (H) 09/03/2013   REPTSTATUS 01/30/2014 FINAL 01/29/2014   GRAMSTAIN  09/02/2013    WBC PRESENT, PREDOMINANTLY MONONUCLEAR NO ORGANISMS SEEN CYTOSPIN Performed by [facility] Performed at [facility]  09/02/2013    NO ORGANISMS SEEN WBC PRESENT, PREDOMINANTLY MONONUCLEAR CYTOSPIN Gram Stain Report Called to,Read Back By and Verified With: A LEDWELL RN 1931 09/02/13 A NAVARRO   CULT NO GROWTH Performed at [facility] 01/29/2014   LABORGA STAPHYLOCOCCUS SPECIES (COAGULASE NEGATIVE) 04/08/2015    Orders:  No orders of the  defined types were placed in this encounter.  No orders of the defined types were placed in this encounter.    Procedures: No procedures performed  Clinical Data: No additional findings.  ROS:  All other systems negative, except as noted in the HPI. Review of Systems  Constitutional: Negative for chills and fever.  Cardiovascular: Negative for leg swelling.  Skin: Negative for wound.    Objective: Vital Signs: There were no vitals taken for this visit.  Specialty Comments:  No specialty comments available.  PMFS History: Patient Active Problem List   Diagnosis Date Noted  . Acute sinus infection 10/20/2016  . Hyperglycemia 10/20/2016  . Skin lesion 10/20/2016  . S/P unilateral BKA (below knee amputation), right ([place]) 06/14/2016  . Charcot foot due to diabetes mellitus ([place])   . Charcot's arthropathy associated with type 2 diabetes mellitus ([place]) 04/11/2016  . Encounter for well adult exam with abnormal findings 11/05/2015  . Major depression 09/13/2015  . S/P TKR (total knee replacement) bilaterally 09/13/2015  . GERD (gastroesophageal reflux disease) 09/08/2015  . S/P laparoscopic hernia repair 05/11/2015  . PPD positive 04/08/2015  . Benign neoplasm of descending colon   . Benign neoplasm of cecum   . AKI (acute kidney injury) ([place]) 10/18/2014  . Acute blood loss anemia   . Chronic anticoagulation   . Occult blood positive stool 10/17/2014  . General weakness 07/14/2014  .  Urinary incontinence 07/14/2014  . Headache(784.0) 10/15/2013  . Spinal stenosis in cervical region 09/26/2013    Class: Chronic  . Spinal stenosis, lumbar region, with neurogenic claudication 09/26/2013    Class: Chronic  . Hand joint pain 06/10/2013  . Rotator cuff tear arthropathy of both shoulders 06/10/2013  . Skin lesion of cheek 05/01/2013  . Pain of right thumb 04/03/2013  . Balance disorder 03/12/2013  . Gait disorder 03/12/2013  . Tremor 03/12/2013  . Left hip pain 03/12/2013   . Pre-ulcerative corn or callous 02/06/2013  . Anxiety 11/12/2011  . OSA (obstructive sleep apnea) 11/07/2011  . Bradycardia 10/20/2011  . Insomnia 10/04/2011  . Obesity 01/12/2011  . ERECTILE DYSFUNCTION, ORGANIC 05/30/2010  . [place] DISEASE, LUMBAR 04/19/2010  . SCIATICA, LEFT 04/19/2010  . Chronic pain syndrome 10/27/2009  . Hyperlipidemia 07/15/2009  . Essential hypertension 06/24/2009  . CAD (coronary artery disease) 06/24/2009  . Allergic rhinitis 06/24/2009  . URETHRAL STRICTURE 06/24/2009  . DEGENERATIVE JOINT DISEASE 06/24/2009  . SHOULDER PAIN, BILATERAL 06/24/2009  . FATIGUE 06/24/2009  . NEPHROLITHIASIS, HX OF 06/24/2009   Past Medical History:  Diagnosis Date  . ALLERGIC RHINITIS 06/24/2009  . Allergic rhinitis 06/24/2009   Qualifier: Diagnosis of  By: [name], [name]   . Anxiety 11/12/2011   Adequate for discharge   . Arthritis    "all my joints" (09/30/2013)  . Arthritis of foot, right, degenerative 04/15/2014  . Balance disorder 03/12/2013  . Benign neoplasm of cecum   . Benign neoplasm of descending colon   . CAD (coronary artery disease) 06/24/2009   5 stents placed in 2007    . Chronic anticoagulation   . Chronic pain syndrome 10/27/2009   of ankle, shoulders, low back.  sciatica.   . Closed fracture of right foot 10/17/2014  . CORONARY ARTERY DISEASE 06/24/2009   a. s/p multiple PCIs - In 2008 he had a Taxus DES to the mild LAD, Endeavor DES to mid LCX and distal LCX. In January 2009 he had DES to distal LCX, mid LCX and proximal LCX. In November 2009 had BMS x 2 to the mid RCA. Cath 10/2011 with patent stents, noncardiac CP. LHC 01/2013: patent stents (noncardiac CP).  . DEGENERATIVE JOINT DISEASE 06/24/2009   Qualifier: Diagnosis of  By: [name], [name]   . Depression   . Depression with anxiety    Prior suicide attempt  . [place] DISEASE, LUMBAR 04/19/2010  . ERECTILE DYSFUNCTION, ORGANIC 05/30/2010  . Essential hypertension 06/24/2009   Qualifier: Diagnosis of  By:  [name], [name]   . Fibromyalgia   . Fracture dislocation of ankle joint 09/02/2015  . Gait disorder 03/12/2013  . General weakness 07/14/2014  . GERD (gastroesophageal reflux disease) 09/08/2015  . Hand joint pain 06/10/2013  . Heart murmur   . Hepatitis C   . History of kidney stones   . Hyperlipidemia 07/15/2009   Qualifier: Diagnosis of  By: Aundra Dubin, MD, [place]    . HYPERLIPIDEMIA-MIXED 07/15/2009  . HYPERTENSION 06/24/2009  . Insomnia 10/04/2011  . Irregular heart beat   . Left hip pain 03/12/2013   Injected under ultrasound guidance on June 24, 2013   . Major depression 09/13/2015  . Myocardial infarction ([place]) 2008  . Non-cardiac chest pain 10/2011, 01/2013  . Obesity   . Occult blood positive stool 10/17/2014  . Open ankle fracture 09/02/2015  . OSA (obstructive sleep apnea)    not using CPAP (09/30/2013)  . Pain of right thumb  04/03/2013  . Pneumonia   . PPD positive 04/08/2015  . Pre-ulcerative corn or callous 02/06/2013  . Restless legs   . Rotator cuff tear arthropathy of both shoulders 06/10/2013   History of bilateral shoulder cuff surgery for rotator cuff tears. Reports increase in pain 09/11/2015 during physical therapy of the left shoulder.   [name] SCIATICA, LEFT 04/19/2010   Qualifier: Diagnosis of  By: [name], [name]   . Spinal stenosis in cervical region 09/26/2013  . Spinal stenosis, lumbar region, with neurogenic claudication 09/26/2013  . Type II diabetes mellitus ([place]) 2012   no meds in 09/2014.   [name] Uncontrolled type 2 DM with peripheral circulatory disorder ([place]) 10/04/2013  . URETHRAL STRICTURE 06/24/2009   self catheterizes.     Family History  Problem Relation Age of Onset  . Depression Mother   . Heart disease Mother   . Hypertension Mother   . Cancer Mother        Breast  . Diabetes Father   . Heart disease Father        CABG  . Cancer Father        prostate and skin cancer  . Hypertension Father   . Hyperlipidemia Father   . Depression Brother        x  2  . Hypertension Brother        x2  . Heart disease Maternal Grandfather   . Early death Maternal Grandfather 58       heart attack  . Early death Paternal Grandfather   . Coronary artery disease Other   . Hypertension Other   . Depression Other     Past Surgical History:  Procedure Laterality Date  . AMPUTATION Right 06/14/2016   Procedure: AMPUTATION BELOW KNEE;  Surgeon: Newt Minion, MD;  Location: [place];  Service: Orthopedics;  Laterality: Right;  . ANKLE FUSION Right 04/15/2014   Procedure: Right Subtalar, Talonavicular Fusion;  Surgeon: Newt Minion, MD;  Location: [facility];  Service: Orthopedics;  Laterality: Right;  . ANKLE FUSION Right 04/18/2016   Right Ankle Tibiocalcaneal Fusion; Removal of deep retained hardware; application of wound vac/notes 04/18/2016  . ANKLE FUSION Right 04/18/2016   Procedure: Right Ankle Tibiocalcaneal Fusion;  Surgeon: Newt Minion, MD;  Location: [facility] [place];  Service: Orthopedics;  Laterality: Right;  . ANTERIOR CERVICAL DECOMP/DISCECTOMY FUSION N/A 09/26/2013   Procedure: ANTERIOR CERVICAL DISCECTOMY FUSION C3-4, plate and screw fixation, allograft bone graft;  Surgeon: Jessy Oto, MD;  Location: [facility];  Service: Orthopedics;  Laterality: N/A;  . BACK SURGERY    . BELOW KNEE LEG AMPUTATION Right 06/14/2016  . CARDIAC CATHETERIZATION  X 1  . CARPAL TUNNEL RELEASE Bilateral   . COLONOSCOPY    . COLONOSCOPY N/A 10/22/2014   Procedure: COLONOSCOPY;  Surgeon: Lafayette Dragon, MD;  [facility] ENDOSCOPY;  Service: Endoscopy;  Laterality: N/A;  . CORONARY ANGIOPLASTY WITH STENT PLACEMENT     "I have 9 stents"  . ESOPHAGOGASTRODUODENOSCOPY N/A 10/19/2014   Procedure: ESOPHAGOGASTRODUODENOSCOPY (EGD);  Surgeon: Jerene Bears, MD;  Location: [facility] ENDOSCOPY;  Service: Endoscopy;  Laterality: N/A;  . FUSION OF TALONAVICULAR JOINT Right 04/15/2014   dr duda  . HERNIA REPAIR     umbilical  . INGUINAL HERNIA REPAIR Right 05/11/2015   Procedure: LAPAROSCOPIC  REPAIR RIGHT  INGUINAL HERNIA;  Surgeon: Greer Pickerel, MD;  Location: [place];  Service: General;  Laterality: Right;  . INSERTION OF MESH Right 05/11/2015  Procedure: INSERTION OF MESH;  Surgeon: Greer Pickerel, MD;  Location: [place];  Service: General;  Laterality: Right;  . JOINT REPLACEMENT Bilateral    knees  . KNEE CARTILAGE SURGERY Right X 12   "~ 1/2 open; ~ 1/2 scopes"  . KNEE CARTILAGE SURGERY Left X 3   "3 scopes"  . LEFT HEART CATHETERIZATION WITH CORONARY ANGIOGRAM N/A 02/10/2013   Procedure: LEFT HEART CATHETERIZATION WITH CORONARY ANGIOGRAM;  Surgeon: Burnell Blanks, MD;  Location[facility] CATH LAB;  Service: Cardiovascular;  Laterality: N/A;  . LUMBAR LAMINECTOMY/DECOMPRESSION MICRODISCECTOMY N/A 01/27/2014   Procedure: CENTRAL LUMBAR LAMINECTOMY L4-5 AND L3-4;  Surgeon: Jessy Oto, MD;  Location: [place];  Service: Orthopedics;  Laterality: N/A;  . ORIF ANKLE FRACTURE Right 09/02/2015   Procedure: OPEN REDUCTION INTERNAL FIXATION (ORIF) ANKLE FRACTURE;  Surgeon: Newt Minion, MD;  Location: [place];  Service: Orthopedics;  Laterality: Right;  . PERIPHERALLY INSERTED CENTRAL CATHETER INSERTION  09/02/2015  . SHOULDER ARTHROSCOPY W/ ROTATOR CUFF REPAIR Bilateral    "3 on the right; 1 on the left"  . SKIN SPLIT GRAFT Right 10/01/2015   Procedure: RIGHT ANKLE APPLY SKIN GRAFT SPLIT THICKNESS;  Surgeon: Newt Minion, MD;  Location: [facility];  Service: Orthopedics;  Laterality: Right;  . TONSILLECTOMY    . TOTAL KNEE ARTHROPLASTY Bilateral 2008  . UMBILICAL HERNIA REPAIR     UHR  . URETHRAL DILATION  X 4  . VASECTOMY     Social History   Occupational History  . disabled since 2006 due to ortho. heart, psych Unemployed  . part time work as an Multimedia programmer, wrestling, and baseball Agricultural consultant    Social History Main Topics  . Smoking status: Former Smoker    Types: Cigars    Quit date: 08/28/2010  . Smokeless tobacco: Never Used     Comment: 04/18/2016 "smoked 1  cigar/wk when I did smoke"  . Alcohol use No     Comment: rarely  . Drug use: No  . Sexual activity: Not Currently

## 2017-01-02 ENCOUNTER — Encounter (INDEPENDENT_AMBULATORY_CARE_PROVIDER_SITE_OTHER): Payer: Medicare HMO | Admitting: Physical Medicine and Rehabilitation

## 2017-01-03 ENCOUNTER — Ambulatory Visit (INDEPENDENT_AMBULATORY_CARE_PROVIDER_SITE_OTHER): Payer: Medicare HMO | Admitting: Physical Medicine and Rehabilitation

## 2017-01-03 ENCOUNTER — Encounter (INDEPENDENT_AMBULATORY_CARE_PROVIDER_SITE_OTHER): Payer: Self-pay | Admitting: Physical Medicine and Rehabilitation

## 2017-01-03 ENCOUNTER — Ambulatory Visit (INDEPENDENT_AMBULATORY_CARE_PROVIDER_SITE_OTHER): Payer: Medicare HMO

## 2017-01-03 VITALS — BP 131/65 | HR 72

## 2017-01-03 DIAGNOSIS — M47816 Spondylosis without myelopathy or radiculopathy, lumbar region: Secondary | ICD-10-CM | POA: Diagnosis not present

## 2017-01-03 MED ORDER — METHYLPREDNISOLONE ACETATE 80 MG/ML IJ SUSP
80.0000 mg | Freq: Once | INTRAMUSCULAR | Status: AC
  Administered 2017-01-03: 80 mg

## 2017-01-03 MED ORDER — LIDOCAINE HCL (PF) 1 % IJ SOLN
2.0000 mL | Freq: Once | INTRAMUSCULAR | Status: AC
  Administered 2017-01-03: 2 mL

## 2017-01-03 NOTE — Progress Notes (Deleted)
Pain across back into buttocks and worse on left side. Constant pain. Worse with standing and walking. No leg pain.

## 2017-01-03 NOTE — Progress Notes (Unsigned)
Fluoro Time: 30 sec Mgy: 44.38

## 2017-01-03 NOTE — Patient Instructions (Signed)

## 2017-01-05 ENCOUNTER — Other Ambulatory Visit: Payer: Self-pay | Admitting: Internal Medicine

## 2017-01-05 NOTE — Telephone Encounter (Signed)
Klonopin refill request appears to be too soon, as he had 6 mo total rx from June 2018

## 2017-01-05 NOTE — Procedures (Signed)
[name] is a 66 year old gentleman with diabetes and right leg amputation. He's been having worsening severe left low back and buttock pain. Dr. make his been following him quite closely. He wanted to have diagnostic facet joint block and sacroiliac joint injection performed. We elected to perform the facet joint blocks first to see diagnostically he gets much relief. He has an appointment for 2 weeks from now for the sacroiliac joint injection if it's needed.  Lumbar Facet Joint Intra-Articular Injection(s) with Fluoroscopic Guidance  Patient: Travis [name].      Date of Birth: [birth year] MRN: [id number] PCP: Travis Borg, MD      Visit Date: 01/03/2017   Universal Protocol:    Date/Time: 01/03/2017  Consent Given By: the patient  Position: PRONE   Additional Comments: Vital signs were monitored before and after the procedure. Patient was prepped and draped in the usual sterile fashion. The correct patient, procedure, and site was verified.   Injection Procedure Details:  Procedure Site One Meds Administered:  Meds ordered this encounter  Medications  . lidocaine (PF) (XYLOCAINE) 1 % injection 2 mL  . methylPREDNISolone acetate (DEPO-MEDROL) injection 80 mg     Laterality: Left  Location/Site:  L4-L5 L5-S1  Needle size: 22 guage  Needle type: Spinal  Needle Placement: Articular  Findings:  -Contrast Used: 1 mL iohexol 180 mg iodine/mL   -Comments: Excellent flow of contrast producing a partial arthrogram.  Procedure Details: The fluoroscope beam is vertically oriented in AP, and the inferior recess is visualized beneath the lower pole of the inferior apophyseal process, which represents the target point for needle insertion. When direct visualization is difficult the target point is located at the medial projection of the vertebral pedicle. The region overlying each aforementioned target is locally anesthetized with a 1 to 2 ml. volume of 1% Lidocaine  without Epinephrine.   The spinal needle was inserted into each of the above mentioned facet joints using biplanar fluoroscopic guidance. A 0.25 to 0.5 ml. volume of Isovue-250 was injected and a partial facet joint arthrogram was obtained. A single spot film was obtained of the resulting arthrogram.    One to 1.25 ml of the steroid/anesthetic solution was then injected into each of the facet joints noted above.   Additional Comments:  The patient tolerated the procedure well Dressing: Band-Aid    Post-procedure details: Patient was observed during the procedure. Post-procedure instructions were reviewed.  Patient left the clinic in stable condition.

## 2017-01-08 ENCOUNTER — Other Ambulatory Visit (INDEPENDENT_AMBULATORY_CARE_PROVIDER_SITE_OTHER): Payer: Self-pay | Admitting: Radiology

## 2017-01-09 ENCOUNTER — Encounter: Payer: Self-pay | Admitting: Cardiology

## 2017-01-09 ENCOUNTER — Ambulatory Visit (INDEPENDENT_AMBULATORY_CARE_PROVIDER_SITE_OTHER): Payer: Medicare HMO | Admitting: Cardiology

## 2017-01-09 ENCOUNTER — Telehealth: Payer: Self-pay | Admitting: Internal Medicine

## 2017-01-09 VITALS — BP 120/70 | HR 59 | Ht 73.0 in | Wt 303.2 lb

## 2017-01-09 DIAGNOSIS — I2510 Atherosclerotic heart disease of native coronary artery without angina pectoris: Secondary | ICD-10-CM | POA: Diagnosis not present

## 2017-01-09 DIAGNOSIS — I10 Essential (primary) hypertension: Secondary | ICD-10-CM

## 2017-01-09 MED ORDER — ACETAMINOPHEN-CODEINE #4 300-60 MG PO TABS: 1.0000 | ORAL_TABLET | ORAL | 0 refills | Status: DC | PRN

## 2017-01-09 MED ORDER — PRASUGREL HCL 5 MG PO TABS: 5.0000 mg | ORAL_TABLET | Freq: Every day | ORAL | 3 refills | Status: DC

## 2017-01-09 NOTE — Patient Instructions (Addendum)
Medication Instructions:  Your physician recommends that you continue on your current medications as directed. Please refer to the Current Medication list given to you today.   Labwork: None ordered  Testing/Procedures: None ordered  Follow-Up: Your physician wants you to follow-up in:  [place]. Cruzita Lederer will receive a reminder letter in the mail two months in advance. If you don't receive a letter, please call our office to schedule the follow-up appointment.   Any Other Special Instructions Will Be Listed Below (If Applicable).     If you need a refill on your cardiac medications before your next appointment, please call your pharmacy.

## 2017-01-09 NOTE — Telephone Encounter (Addendum)
Did you call the patient or the pharmacy?

## 2017-01-09 NOTE — Telephone Encounter (Signed)
Noted  

## 2017-01-09 NOTE — Telephone Encounter (Signed)
Pharmacy request a script for:  Lidocaine 5% ointment Apply 2-3 grams to affected area 3-4 times per day

## 2017-01-09 NOTE — Telephone Encounter (Signed)
I am not comfortable with refill of lidocaine ointment, since this has not been prescribed previously for this patient  He had has lidoderm in past, but not the ointment
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
## 2017-01-09 NOTE — Telephone Encounter (Signed)
Pharmacy called asking if we have a received a fax for a new script for diclofenac gel for the Pt  Please call back   Reference Number [id number]

## 2017-01-09 NOTE — Telephone Encounter (Signed)
Yes, pt stated that he has note had lidoderm or anything like that in quite a while. He stated that he did not request it from the mail order. Ewe can disregard refill request.

## 2017-01-09 NOTE — Telephone Encounter (Signed)
Called rx for Tylenol #4 to [facility]in at [facility]

## 2017-01-16 ENCOUNTER — Ambulatory Visit (INDEPENDENT_AMBULATORY_CARE_PROVIDER_SITE_OTHER): Payer: Medicare HMO | Admitting: Physical Medicine and Rehabilitation

## 2017-01-17 ENCOUNTER — Ambulatory Visit (INDEPENDENT_AMBULATORY_CARE_PROVIDER_SITE_OTHER): Payer: Medicare HMO | Admitting: Physical Medicine and Rehabilitation

## 2017-01-17 ENCOUNTER — Ambulatory Visit (INDEPENDENT_AMBULATORY_CARE_PROVIDER_SITE_OTHER): Payer: Medicare HMO

## 2017-01-17 DIAGNOSIS — M461 Sacroiliitis, not elsewhere classified: Secondary | ICD-10-CM

## 2017-01-17 NOTE — Progress Notes (Deleted)
Left lower back pain into buttock. Worse with walking.  States he did not get any relief with last injection.

## 2017-01-17 NOTE — Patient Instructions (Signed)

## 2017-01-17 NOTE — Progress Notes (Signed)
Welcome Travis Sr. - 66 y.o. male MRN [id number]  Date of birth: [birth year]  Office Visit Note: Visit Date: 01/17/2017 PCP: [name], MD Referred by: [name], MD  Subjective: Chief Complaint  Patient presents with  . Lower Back - Pain   HPI: Travis [name] is a 66 year old gentleman that I saw couple weeks ago for left-sided facet joint blocks which really offered him no relief at all. Reviewing those images show good well-placed facet joint blocks. We are going to diagnostically try a left sacroiliac joint injection per Dr. Otho Ket request. He'll follow up with Dr. Louanne Skye. His pain is in the left buttock and it is worse with ambulation. He does have a below-the-knee amputation on the right.    ROS Otherwise per HPI.  Assessment & Plan: Visit Diagnoses:  1. Sacroiliitis ([place])     Plan: Findings:  Well-placed sacroiliac joint injection on the left. Flow of contrast is in the lower third of the joint without flow of contrast in the upper part of the joint.    Meds & Orders: No orders of the defined types were placed in this encounter.   Orders Placed This Encounter  Procedures  . Large Joint Injection/Arthrocentesis  . XR C-ARM NO REPORT    Follow-up: Return if symptoms worsen or fail to improve, for Dr. Louanne Skye.   Procedures: Large Joint Inj Date/Time: 01/17/2017 2:19 PM Performed by: Magnus Sinning Authorized by: Magnus Sinning   Consent Given by:  Patient Site marked: the procedure site was marked   Timeout: prior to procedure the correct patient, procedure, and site was verified   Indications:  Pain and diagnostic evaluation Location:  Sacroiliac Site:  L sacroiliac joint Prep: patient was prepped and draped in usual sterile fashion   Needle Size:  22 G Needle Length:  3.5 inches Approach:  Posterior Ultrasound Guidance: No   Fluoroscopic Guidance: Yes   Arthrogram: No   Medications:  80 mg methylPREDNISolone acetate 80 MG/ML; 2 mL bupivacaine 0.5  % Aspiration Attempted: No   Patient tolerance:  Patient tolerated the procedure well with no immediate complications  There was excellent flow of contrast producing a partial arthrogram of the sacroiliac joint.     No notes on file   Clinical History: MRI LUMBAR SPINE WITHOUT AND WITH CONTRAST  TECHNIQUE: Multiplanar and multiecho pulse sequences of the lumbar spine were obtained without and with intravenous contrast.  CONTRAST:  45m MULTIHANCE GADOBENATE DIMEGLUMINE 529 MG/ML IV SOLN  Creatinine was obtained on site at [facility] [address]  Results: Creatinine 0.8 mg/dL.  COMPARISON:  MRI lumbar spine 03/17/2014.  Plain films 04/23/2014.  FINDINGS: Segmentation: Normal.  Alignment: Trace retrolisthesis L2-3 is facet mediated. Similar trace anterolisthesis L4-5 postsurgical and facet mediated.  Vertebrae: No worrisome osseous lesion.  Conus medullaris: Normal in size, signal, and location.  Paraspinal tissues: No evidence for hydronephrosis or paravertebral mass.  Disc levels:  L1-L2: Central and rightward protrusion. Osseous spurring to the RIGHT. Mild facet arthropathy. RIGHT L1 and RIGHT L2 nerve root impingement are possible.  L2-L3: Disc space narrowing. Disc osteophyte formation with annular bulging. Prominent Schmorl's node extends downward to the LEFT. Posterior element hypertrophy. Slight subarticular zone narrowing is worse on the LEFT. LEFT L3 nerve root impingement is possible.  L3-L4: Mild bulge. Moderate posterior element hypertrophy. Mild stenosis. Either L4 nerve root could be affected.  L4-L5: Central and leftward protrusion extends to the foramen. An additional component extends into the subarticular  zone and foraminal zone on the RIGHT. Asymmetric facet arthropathy worse on the LEFT. Adequate posterior decompression. BILATERAL L4 and L5 nerve root impingement are likely.  L5-S1: Severe disc space  narrowing. Mild facet arthropathy. No impingement.  Post infusion, no worrisome postcontrast enhancement. Peridiscal enhancement is most notable at L4-5 on the RIGHT.  Compared with priors, the previously noted fluid collection has resolved.  IMPRESSION: Postsurgical changes at L4-5, with residual/recurrent disc material narrowing the subarticular zone and foraminal zone bilaterally. L4 and L5 nerve root impingement likely.  Severe disc space narrowing L5-S1 is stable. No definite impingement this level.  Asymmetric spondylosis at L2-3 on the LEFT at L1-2 on the RIGHT could affect the subarticular and foraminal nerve roots at those levels, respectively.  Mild stenosis at L3-4 is multifactorial. Annular bulging combined with posterior element hypertrophy could affect the exiting L4 nerve roots.   Electronically Signed   By: Staci Righter M.D.   On: 08/30/2015 15:44  He reports that he quit smoking about 6 years ago. His smoking use included Cigars. He has never used smokeless tobacco.   Recent Labs  04/18/16 0830  HGBA1C 5.3    Objective:  VS:  HT:    WT:   BMI:     BP:   HR: bpm  TEMP: ( )  RESP:  Physical Exam  Musculoskeletal:  Patient is in wheelchair today. He does have a right below-the-knee amputation. He does have a positive Fortin finger test to the left.    Ortho Exam Imaging: No results found.  Past Medical/Family/Surgical/Social History: Medications & Allergies reviewed per EMR Patient Active Problem List   Diagnosis Date Noted  . Acute sinus infection 10/20/2016  . Hyperglycemia 10/20/2016  . Skin lesion 10/20/2016  . S/P unilateral BKA (below knee amputation), right ([place]) 06/14/2016  . Charcot foot due to diabetes mellitus ([place])   . Charcot's arthropathy associated with type 2 diabetes mellitus ([place]) 04/11/2016  . Encounter for well adult exam with abnormal findings 11/05/2015  . Major depression 09/13/2015  . S/P TKR (total knee  replacement) bilaterally 09/13/2015  . GERD (gastroesophageal reflux disease) 09/08/2015  . S/P laparoscopic hernia repair 05/11/2015  . PPD positive 04/08/2015  . Benign neoplasm of descending colon   . Benign neoplasm of cecum   . AKI (acute kidney injury) ([place]) 10/18/2014  . Acute blood loss anemia   . Chronic anticoagulation   . Occult blood positive stool 10/17/2014  . General weakness 07/14/2014  . Urinary incontinence 07/14/2014  . Headache(784.0) 10/15/2013  . Spinal stenosis in cervical region 09/26/2013    Class: Chronic  . Spinal stenosis, lumbar region, with neurogenic claudication 09/26/2013    Class: Chronic  . Hand joint pain 06/10/2013  . Rotator cuff tear arthropathy of both shoulders 06/10/2013  . Skin lesion of cheek 05/01/2013  . Pain of right thumb 04/03/2013  . Balance disorder 03/12/2013  . Gait disorder 03/12/2013  . Tremor 03/12/2013  . Left hip pain 03/12/2013  . Pre-ulcerative corn or callous 02/06/2013  . Anxiety 11/12/2011  . OSA (obstructive sleep apnea) 11/07/2011  . Bradycardia 10/20/2011  . Insomnia 10/04/2011  . Obesity 01/12/2011  . ERECTILE DYSFUNCTION, ORGANIC 05/30/2010  . [place] DISEASE, LUMBAR 04/19/2010  . SCIATICA, LEFT 04/19/2010  . Chronic pain syndrome 10/27/2009  . Hyperlipidemia 07/15/2009  . Essential hypertension 06/24/2009  . CAD (coronary artery disease) 06/24/2009  . Allergic rhinitis 06/24/2009  . URETHRAL STRICTURE 06/24/2009  . DEGENERATIVE JOINT DISEASE 06/24/2009  . SHOULDER  PAIN, BILATERAL 06/24/2009  . FATIGUE 06/24/2009  . NEPHROLITHIASIS, HX OF 06/24/2009   Past Medical History:  Diagnosis Date  . ALLERGIC RHINITIS 06/24/2009  . Allergic rhinitis 06/24/2009   Qualifier: Diagnosis of  By: [name], [name]   . Anxiety 11/12/2011   Adequate for discharge   . Arthritis    "all my joints" (09/30/2013)  . Arthritis of foot, right, degenerative 04/15/2014  . Balance disorder 03/12/2013  . Benign neoplasm of cecum     . Benign neoplasm of descending colon   . CAD (coronary artery disease) 06/24/2009   5 stents placed in 2007    . Chronic anticoagulation   . Chronic pain syndrome 10/27/2009   of ankle, shoulders, low back.  sciatica.   . Closed fracture of right foot 10/17/2014  . CORONARY ARTERY DISEASE 06/24/2009   a. s/p multiple PCIs - In 2008 he had a Taxus DES to the mild LAD, Endeavor DES to mid LCX and distal LCX. In January 2009 he had DES to distal LCX, mid LCX and proximal LCX. In November 2009 had BMS x 2 to the mid RCA. Cath 10/2011 with patent stents, noncardiac CP. LHC 01/2013: patent stents (noncardiac CP).  . DEGENERATIVE JOINT DISEASE 06/24/2009   Qualifier: Diagnosis of  By: [name], [name]   . Depression   . Depression with anxiety    Prior suicide attempt  . [place] DISEASE, LUMBAR 04/19/2010  . ERECTILE DYSFUNCTION, ORGANIC 05/30/2010  . Essential hypertension 06/24/2009   Qualifier: Diagnosis of  By: [name], [name]   . Fibromyalgia   . Fracture dislocation of ankle joint 09/02/2015  . Gait disorder 03/12/2013  . General weakness 07/14/2014  . GERD (gastroesophageal reflux disease) 09/08/2015  . Hand joint pain 06/10/2013  . Heart murmur   . Hepatitis C   . History of kidney stones   . Hyperlipidemia 07/15/2009   Qualifier: Diagnosis of  By: Aundra Dubin, MD, [place]    . HYPERLIPIDEMIA-MIXED 07/15/2009  . HYPERTENSION 06/24/2009  . Insomnia 10/04/2011  . Irregular heart beat   . Left hip pain 03/12/2013   Injected under ultrasound guidance on June 24, 2013   . Major depression 09/13/2015  . Myocardial infarction ([place]) 2008  . Non-cardiac chest pain 10/2011, 01/2013  . Obesity   . Occult blood positive stool 10/17/2014  . Open ankle fracture 09/02/2015  . OSA (obstructive sleep apnea)    not using CPAP (09/30/2013)  . Pain of right thumb 04/03/2013  . Pneumonia   . PPD positive 04/08/2015  . Pre-ulcerative corn or callous 02/06/2013  . Restless legs   . Rotator cuff tear arthropathy of both  shoulders 06/10/2013   History of bilateral shoulder cuff surgery for rotator cuff tears. Reports increase in pain 09/11/2015 during physical therapy of the left shoulder.   [name] SCIATICA, LEFT 04/19/2010   Qualifier: Diagnosis of  By: [name], [name]   . Spinal stenosis in cervical region 09/26/2013  . Spinal stenosis, lumbar region, with neurogenic claudication 09/26/2013  . Type II diabetes mellitus ([place]) 2012   no meds in 09/2014.   [name] Uncontrolled type 2 DM with peripheral circulatory disorder ([place]) 10/04/2013  . URETHRAL STRICTURE 06/24/2009   self catheterizes.    Family History  Problem Relation Age of Onset  . Depression Mother   . Heart disease Mother   . Hypertension Mother   . Cancer Mother        Breast  . Diabetes Father   .  Heart disease Father        CABG  . Cancer Father        prostate and skin cancer  . Hypertension Father   . Hyperlipidemia Father   . Depression Brother        x 2  . Hypertension Brother        x2  . Heart disease Maternal Grandfather   . Early death Maternal Grandfather 9       heart attack  . Early death Paternal Grandfather   . Coronary artery disease Other   . Hypertension Other   . Depression Other    Past Surgical History:  Procedure Laterality Date  . AMPUTATION Right 06/14/2016   Procedure: AMPUTATION BELOW KNEE;  Surgeon: Newt Minion, MD;  Location: [facility];  Service: Orthopedics;  Laterality: Right;  . ANKLE FUSION Right 04/15/2014   Procedure: Right Subtalar, Talonavicular Fusion;  Surgeon: Newt Minion, MD;  Location: [facility];  Service: Orthopedics;  Laterality: Right;  . ANKLE FUSION Right 04/18/2016   Right Ankle Tibiocalcaneal Fusion; Removal of deep retained hardware; application of wound vac/notes 04/18/2016  . ANKLE FUSION Right 04/18/2016   Procedure: Right Ankle Tibiocalcaneal Fusion;  Surgeon: Newt Minion, MD;  Location: [place];  Service: Orthopedics;  Laterality: Right;  . ANTERIOR CERVICAL DECOMP/DISCECTOMY FUSION N/A  09/26/2013   Procedure: ANTERIOR CERVICAL DISCECTOMY FUSION C3-4, plate and screw fixation, allograft bone graft;  Surgeon: Jessy Oto, MD;  Location: [place];  Service: Orthopedics;  Laterality: N/A;  . BACK SURGERY    . BELOW KNEE LEG AMPUTATION Right 06/14/2016  . CARDIAC CATHETERIZATION  X 1  . CARPAL TUNNEL RELEASE Bilateral   . COLONOSCOPY    . COLONOSCOPY N/A 10/22/2014   Procedure: COLONOSCOPY;  Surgeon: Lafayette Dragon, MD;  Location[facility] ENDOSCOPY;  Service: Endoscopy;  Laterality: N/A;  . CORONARY ANGIOPLASTY WITH STENT PLACEMENT     "I have 9 stents"  . ESOPHAGOGASTRODUODENOSCOPY N/A 10/19/2014   Procedure: ESOPHAGOGASTRODUODENOSCOPY (EGD);  Surgeon: Jerene Bears, MD;  Location: [facility] ENDOSCOPY;  Service: Endoscopy;  Laterality: N/A;  . FUSION OF TALONAVICULAR JOINT Right 04/15/2014   dr duda  . HERNIA REPAIR     umbilical  . INGUINAL HERNIA REPAIR Right 05/11/2015   Procedure: LAPAROSCOPIC REPAIR RIGHT  INGUINAL HERNIA;  Surgeon: Greer Pickerel, MD;  Location: [place];  Service: General;  Laterality: Right;  . INSERTION OF MESH Right 05/11/2015   Procedure: INSERTION OF MESH;  Surgeon: Greer Pickerel, MD;  Location: [place];  Service: General;  Laterality: Right;  . JOINT REPLACEMENT Bilateral    knees  . KNEE CARTILAGE SURGERY Right X 12   "~ 1/2 open; ~ 1/2 scopes"  . KNEE CARTILAGE SURGERY Left X 3   "3 scopes"  . LEFT HEART CATHETERIZATION WITH CORONARY ANGIOGRAM N/A 02/10/2013   Procedure: LEFT HEART CATHETERIZATION WITH CORONARY ANGIOGRAM;  Surgeon: Burnell Blanks, MD;  Location: [facility] CATH LAB;  Service: Cardiovascular;  Laterality: N/A;  . LUMBAR LAMINECTOMY/DECOMPRESSION MICRODISCECTOMY N/A 01/27/2014   Procedure: CENTRAL LUMBAR LAMINECTOMY L4-5 AND L3-4;  Surgeon: Jessy Oto, MD;  Location: [place];  Service: Orthopedics;  Laterality: N/A;  . ORIF ANKLE FRACTURE Right 09/02/2015   Procedure: OPEN REDUCTION INTERNAL FIXATION (ORIF) ANKLE FRACTURE;  Surgeon: Newt Minion, MD;   Location: [place];  Service: Orthopedics;  Laterality: Right;  . PERIPHERALLY INSERTED CENTRAL CATHETER INSERTION  09/02/2015  . SHOULDER ARTHROSCOPY W/ ROTATOR CUFF REPAIR Bilateral    "  3 on the right; 1 on the left"  . SKIN SPLIT GRAFT Right 10/01/2015   Procedure: RIGHT ANKLE APPLY SKIN GRAFT SPLIT THICKNESS;  Surgeon: Newt Minion, MD;  Location: [place];  Service: Orthopedics;  Laterality: Right;  . TONSILLECTOMY    . TOTAL KNEE ARTHROPLASTY Bilateral 2008  . UMBILICAL HERNIA REPAIR     UHR  . URETHRAL DILATION  X 4  . VASECTOMY     Social History   Occupational History  . disabled since 2006 due to ortho. heart, psych Unemployed  . part time work as an Multimedia programmer, wrestling, and baseball Agricultural consultant    Social History Main Topics  . Smoking status: Former Smoker    Types: Cigars    Quit date: 08/28/2010  . Smokeless tobacco: Never Used     Comment: 04/18/2016 "smoked 1 cigar/wk when I did smoke"  . Alcohol use No     Comment: rarely  . Drug use: No  . Sexual activity: Not Currently

## 2017-01-18 ENCOUNTER — Other Ambulatory Visit: Payer: Self-pay | Admitting: Internal Medicine

## 2017-01-19 MED ORDER — METHYLPREDNISOLONE ACETATE 80 MG/ML IJ SUSP
80.0000 mg | INTRAMUSCULAR | Status: AC | PRN
  Administered 2017-01-17: 80 mg via INTRA_ARTICULAR

## 2017-01-19 MED ORDER — BUPIVACAINE HCL 0.5 % IJ SOLN
2.0000 mL | INTRAMUSCULAR | Status: AC | PRN
  Administered 2017-01-17: 2 mL via INTRA_ARTICULAR

## 2017-01-19 MED ORDER — CLONAZEPAM 1 MG PO TABS: 1.0000 mg | ORAL_TABLET | Freq: Every day | ORAL | 3 refills | Status: DC

## 2017-01-19 NOTE — Telephone Encounter (Signed)
Done hardcopy to [place]  

## 2017-01-19 NOTE — Addendum Note (Signed)
Addended by: [name] on: 01/19/2017 12:29 PM   Modules accepted: Orders

## 2017-01-19 NOTE — Telephone Encounter (Signed)
Pt called about this refill. I told him that he was given a 30 day supply with 5 refills in June. He said that he switched to [facility] and they were not able to get the prescription with additional refills from [facility]. Could this be sent over to [facility] or what would be the best thing to do?

## 2017-01-19 NOTE — Telephone Encounter (Signed)
Faxed

## 2017-01-26 ENCOUNTER — Encounter: Payer: Self-pay | Admitting: Internal Medicine

## 2017-01-26 ENCOUNTER — Ambulatory Visit (INDEPENDENT_AMBULATORY_CARE_PROVIDER_SITE_OTHER): Payer: Medicare HMO | Admitting: Internal Medicine

## 2017-01-26 VITALS — BP 120/64 | HR 57 | Temp 98.4°F | Ht 73.0 in | Wt 298.0 lb

## 2017-01-26 DIAGNOSIS — I10 Essential (primary) hypertension: Secondary | ICD-10-CM | POA: Diagnosis not present

## 2017-01-26 DIAGNOSIS — Z0001 Encounter for general adult medical examination with abnormal findings: Secondary | ICD-10-CM

## 2017-01-26 DIAGNOSIS — Z23 Encounter for immunization: Secondary | ICD-10-CM | POA: Diagnosis not present

## 2017-01-26 DIAGNOSIS — R739 Hyperglycemia, unspecified: Secondary | ICD-10-CM

## 2017-01-26 DIAGNOSIS — K148 Other diseases of tongue: Secondary | ICD-10-CM | POA: Diagnosis not present

## 2017-01-26 DIAGNOSIS — K1321 Leukoplakia of oral mucosa, including tongue: Secondary | ICD-10-CM | POA: Insufficient documentation

## 2017-01-26 MED ORDER — NYSTATIN 100000 UNIT/ML MT SUSP: 500000.0000 [IU] | Freq: Four times a day (QID) | OROMUCOSAL | 1 refills | Status: DC

## 2017-01-26 NOTE — Patient Instructions (Addendum)
You had the flu shot today  Please take all new medication as prescribed - the nystatin  Please continue all other medications as before, and refills have been done if requested.  Please have the pharmacy call with any other refills you may need.  Please keep your appointments with your specialists as you may have planned   Please go to the LAB in the Basement (turn left off the elevator) for the tests to be done today  You will be contacted by phone if any changes need to be made immediately.  Otherwise, you will receive a letter about your results with an explanation, but please check with MyChart first.  Please remember to sign up for MyChart if you have not done so, as this will be important to you in the future with finding out test results, communicating by private email, and scheduling acute appointments online when needed.  If you have Medicare related insurance (such as traditional Medicare, Blue [employer] or [employer], or similar), Please make an appointment at the [facility] with Sharee Pimple, the [employer], for your Wellness Visit in this office, which is a benefit with your insurance.  Please return in 6 months, or sooner if needed, with Lab testing done 3-5 days before

## 2017-01-26 NOTE — Progress Notes (Signed)
Subjective:    Patient ID: Travis Fortis Sr., male    DOB: 1950-12-05, 66 y.o.   MRN: [id number]  HPI  Here for wellness and f/u;  Overall doing ok;  Pt denies Chest pain, worsening SOB, DOE, wheezing, orthopnea, PND, worsening LE edema, palpitations, dizziness or syncope.  Pt denies neurological change such as new headache, facial or extremity weakness.  Pt denies polydipsia, polyuria, or low sugar symptoms. Pt states overall good compliance with treatment and medications, good tolerability, and has been trying to follow appropriate diet.  Pt denies worsening depressive symptoms, suicidal ideation or panic. No fever, night sweats, wt loss, loss of appetite, or other constitutional symptoms.  Pt states good ability with ADL's, has low fall risk, home safety reviewed and adequate, no other significant changes in hearing or vision, and active with exercise.  Family stress mild worse recently.     Also c/o dorsal tongue rash with ? Lesion to mid back of the visible tongue, 1 wk, constant, mild burtning discomfort but no swelling, nothing seems to make better or worse. Past Medical History:  Diagnosis Date  . ALLERGIC RHINITIS 06/24/2009  . Allergic rhinitis 06/24/2009   Qualifier: Diagnosis of  By: [name], [name]   . Anxiety 11/12/2011   Adequate for discharge   . Arthritis    "all my joints" (09/30/2013)  . Arthritis of foot, right, degenerative 04/15/2014  . Balance disorder 03/12/2013  . Benign neoplasm of cecum   . Benign neoplasm of descending colon   . CAD (coronary artery disease) 06/24/2009   5 stents placed in 2007    . Chronic anticoagulation   . Chronic pain syndrome 10/27/2009   of ankle, shoulders, low back.  sciatica.   . Closed fracture of right foot 10/17/2014  . CORONARY ARTERY DISEASE 06/24/2009   a. s/p multiple PCIs - In 2008 he had a Taxus DES to the mild LAD, Endeavor DES to mid LCX and distal LCX. In January 2009 he had DES to distal LCX, mid LCX and proximal LCX. In November  2009 had BMS x 2 to the mid RCA. Cath 10/2011 with patent stents, noncardiac CP. LHC 01/2013: patent stents (noncardiac CP).  . DEGENERATIVE JOINT DISEASE 06/24/2009   Qualifier: Diagnosis of  By: [name], [name]   . Depression   . Depression with anxiety    Prior suicide attempt  . [place] DISEASE, LUMBAR 04/19/2010  . ERECTILE DYSFUNCTION, ORGANIC 05/30/2010  . Essential hypertension 06/24/2009   Qualifier: Diagnosis of  By: [name], [name]   . Fibromyalgia   . Fracture dislocation of ankle joint 09/02/2015  . Gait disorder 03/12/2013  . General weakness 07/14/2014  . GERD (gastroesophageal reflux disease) 09/08/2015  . Hand joint pain 06/10/2013  . Heart murmur   . Hepatitis C   . History of kidney stones   . Hyperlipidemia 07/15/2009   Qualifier: Diagnosis of  By: Aundra Dubin, MD, [place]    . HYPERLIPIDEMIA-MIXED 07/15/2009  . HYPERTENSION 06/24/2009  . Insomnia 10/04/2011  . Irregular heart beat   . Left hip pain 03/12/2013   Injected under ultrasound guidance on June 24, 2013   . Major depression 09/13/2015  . Myocardial infarction ([place]) 2008  . Non-cardiac chest pain 10/2011, 01/2013  . Obesity   . Occult blood positive stool 10/17/2014  . Open ankle fracture 09/02/2015  . OSA (obstructive sleep apnea)    not using CPAP (09/30/2013)  . Pain of right thumb 04/03/2013  . Pneumonia   .  PPD positive 04/08/2015  . Pre-ulcerative corn or callous 02/06/2013  . Restless legs   . Rotator cuff tear arthropathy of both shoulders 06/10/2013   History of bilateral shoulder cuff surgery for rotator cuff tears. Reports increase in pain 09/11/2015 during physical therapy of the left shoulder.   [name] SCIATICA, LEFT 04/19/2010   Qualifier: Diagnosis of  By: [name], [name]   . Spinal stenosis in cervical region 09/26/2013  . Spinal stenosis, lumbar region, with neurogenic claudication 09/26/2013  . Type II diabetes mellitus ([place]) 2012   no meds in 09/2014.   [name] Uncontrolled type 2 DM with peripheral circulatory  disorder ([place]) 10/04/2013  . URETHRAL STRICTURE 06/24/2009   self catheterizes.    Past Surgical History:  Procedure Laterality Date  . AMPUTATION Right 06/14/2016   Procedure: AMPUTATION BELOW KNEE;  Surgeon: Newt Minion, MD;  Location: [place];  Service: Orthopedics;  Laterality: Right;  . ANKLE FUSION Right 04/15/2014   Procedure: Right Subtalar, Talonavicular Fusion;  Surgeon: Newt Minion, MD;  Location: [place];  Service: Orthopedics;  Laterality: Right;  . ANKLE FUSION Right 04/18/2016   Right Ankle Tibiocalcaneal Fusion; Removal of deep retained hardware; application of wound vac/notes 04/18/2016  . ANKLE FUSION Right 04/18/2016   Procedure: Right Ankle Tibiocalcaneal Fusion;  Surgeon: Newt Minion, MD;  Location: [place];  Service: Orthopedics;  Laterality: Right;  . ANTERIOR CERVICAL DECOMP/DISCECTOMY FUSION N/A 09/26/2013   Procedure: ANTERIOR CERVICAL DISCECTOMY FUSION C3-4, plate and screw fixation, allograft bone graft;  Surgeon: Jessy Oto, MD;  Location: [place];  Service: Orthopedics;  Laterality: N/A;  . BACK SURGERY    . BELOW KNEE LEG AMPUTATION Right 06/14/2016  . CARDIAC CATHETERIZATION  X 1  . CARPAL TUNNEL RELEASE Bilateral   . COLONOSCOPY    . COLONOSCOPY N/A 10/22/2014   Procedure: COLONOSCOPY;  Surgeon: Lafayette Dragon, MD;  Location[facility] ENDOSCOPY;  Service: Endoscopy;  Laterality: N/A;  . CORONARY ANGIOPLASTY WITH STENT PLACEMENT     "I have 9 stents"  . ESOPHAGOGASTRODUODENOSCOPY N/A 10/19/2014   Procedure: ESOPHAGOGASTRODUODENOSCOPY (EGD);  Surgeon: Jerene Bears, MD;  Location: [facility] ENDOSCOPY;  Service: Endoscopy;  Laterality: N/A;  . FUSION OF TALONAVICULAR JOINT Right 04/15/2014   dr duda  . HERNIA REPAIR     umbilical  . INGUINAL HERNIA REPAIR Right 05/11/2015   Procedure: LAPAROSCOPIC REPAIR RIGHT  INGUINAL HERNIA;  Surgeon: Greer Pickerel, MD;  Location: [place];  Service: General;  Laterality: Right;  . INSERTION OF MESH Right 05/11/2015   Procedure: INSERTION OF  MESH;  Surgeon: Greer Pickerel, MD;  Location: [place];  Service: General;  Laterality: Right;  . JOINT REPLACEMENT Bilateral    knees  . KNEE CARTILAGE SURGERY Right X 12   "~ 1/2 open; ~ 1/2 scopes"  . KNEE CARTILAGE SURGERY Left X 3   "3 scopes"  . LEFT HEART CATHETERIZATION WITH CORONARY ANGIOGRAM N/A 02/10/2013   Procedure: LEFT HEART CATHETERIZATION WITH CORONARY ANGIOGRAM;  Surgeon: Burnell Blanks, MD;  Location: [facility] CATH LAB;  Service: Cardiovascular;  Laterality: N/A;  . LUMBAR LAMINECTOMY/DECOMPRESSION MICRODISCECTOMY N/A 01/27/2014   Procedure: CENTRAL LUMBAR LAMINECTOMY L4-5 AND L3-4;  Surgeon: Jessy Oto, MD;  Location: [place];  Service: Orthopedics;  Laterality: N/A;  . ORIF ANKLE FRACTURE Right 09/02/2015   Procedure: OPEN REDUCTION INTERNAL FIXATION (ORIF) ANKLE FRACTURE;  Surgeon: Newt Minion, MD;  Location: [place];  Service: Orthopedics;  Laterality: Right;  . PERIPHERALLY INSERTED  CENTRAL CATHETER INSERTION  09/02/2015  . SHOULDER ARTHROSCOPY W/ ROTATOR CUFF REPAIR Bilateral    "3 on the right; 1 on the left"  . SKIN SPLIT GRAFT Right 10/01/2015   Procedure: RIGHT ANKLE APPLY SKIN GRAFT SPLIT THICKNESS;  Surgeon: Newt Minion, MD;  Location: [place];  Service: Orthopedics;  Laterality: Right;  . TONSILLECTOMY    . TOTAL KNEE ARTHROPLASTY Bilateral 2008  . UMBILICAL HERNIA REPAIR     UHR  . URETHRAL DILATION  X 4  . VASECTOMY      reports that he quit smoking about 6 years ago. His smoking use included Cigars. He has never used smokeless tobacco. He reports that he does not drink alcohol or use drugs. family history includes Cancer in his father and mother; Coronary artery disease in his other; Depression in his brother, mother, and other; Diabetes in his father; Early death in his paternal grandfather; Early death (age of onset: 58) in his maternal grandfather; Heart disease in his father, maternal grandfather, and mother; Hyperlipidemia in his father; Hypertension in his  brother, father, mother, and other. No Known Allergies Current Outpatient Prescriptions on File Prior to Visit  Medication Sig Dispense Refill  . acetaminophen-codeine (TYLENOL #4) 300-60 MG tablet Take 1 tablet by mouth every 4 (four) hours as needed for moderate pain or severe pain. 90 tablet 0  . ARIPiprazole (ABILIFY) 5 MG tablet Take 1 tablet (5 mg total) by mouth daily. Take one tablet at bedtime 90 tablet 0  . aspirin 81 MG tablet Take 81 mg by mouth daily.    [name] atorvastatin (LIPITOR) 20 MG tablet TAKE 1 TABLET BY MOUTH EVERY DAY 90 tablet 3  . benztropine (COGENTIN) 0.5 MG tablet Take 1 tablet (0.5 mg total) by mouth at bedtime. 90 tablet 0  . clonazePAM (KLONOPIN) 1 MG tablet Take 1 tablet (1 mg total) by mouth at bedtime. 30 tablet 3  . cyclobenzaprine (FLEXERIL) 5 MG tablet take 1 TABLET BY MOUTH THREE TIMES DAILY AS NEEDED for muscle spasms 60 tablet 2  . FLUoxetine (PROZAC) 40 MG capsule TAKE 1 CAPSULE(40 MG) BY MOUTH DAILY 90 capsule 0  . gabapentin (NEURONTIN) 300 MG capsule Take 300 mg by mouth 3 (three) times daily.    . metFORMIN (GLUCOPHAGE) 500 MG tablet Take 1 tablet (500 mg total) by mouth 2 (two) times daily with a meal. 180 tablet 3  . methocarbamol (ROBAXIN) 500 MG tablet Take 1 TABLET BY MOUTH EVERY 6 HOURS AS NEEDED for muscle spasms  4  . mirabegron ER (MYRBETRIQ) 25 MG TB24 tablet Take 25 mg by mouth daily.    . Omega-3 Fatty Acids (FISH OIL) 1000 MG CAPS Take 1,000 mg by mouth daily.     . pantoprazole (PROTONIX) 40 MG tablet Take 1 tablet (40 mg total) by mouth daily. 90 tablet 3  . prasugrel (EFFIENT) 5 MG TABS tablet Take 1 tablet (5 mg total) by mouth daily. 90 tablet 3  . tamsulosin (FLOMAX) 0.4 MG CAPS capsule Take 0.4 mg by mouth daily after breakfast.     . traZODone (DESYREL) 150 MG tablet Take 1 tablet (150 mg total) by mouth at bedtime. 90 tablet 0  . triamcinolone (NASACORT AQ) 55 MCG/ACT AERO nasal inhaler Place 2 sprays into the nose daily. 1 Inhaler  12   No current facility-administered medications on file prior to visit.    Review of Systems Constitutional: Negative for other unusual diaphoresis, sweats, appetite or weight changes HENT: Negative for  other worsening hearing loss, ear pain, facial swelling, mouth sores or neck stiffness.   Eyes: Negative for other worsening pain, redness or other visual disturbance.  Respiratory: Negative for other stridor or swelling Cardiovascular: Negative for other palpitations or other chest pain  Gastrointestinal: Negative for worsening diarrhea or loose stools, blood in stool, distention or other pain Genitourinary: Negative for hematuria, flank pain or other change in urine volume.  Musculoskeletal: Negative for myalgias or other joint swelling.  Skin: Negative for other color change, or other wound or worsening drainage.  Neurological: Negative for other syncope or numbness. Hematological: Negative for other adenopathy or swelling Psychiatric/Behavioral: Negative for hallucinations, other worsening agitation, SI, self-injury, or new decreased concentration All other system neg per pt    Objective:   Physical Exam BP 120/64   Pulse (!) 57   Temp 98.4 F (36.9 C)   Ht 6' 1"  (1.854 m)   Wt 298 lb (135.2 kg)   SpO2 98%   BMI 39.32 kg/m  VS noted,  Constitutional: Pt is oriented to person, place, and time. Appears well-developed and well-nourished, in no significant distress and comfortable Head: Normocephalic and atraumatic  Eyes: Conjunctivae and EOM are normal. Pupils are equal, round, and reactive to light Right Ear: External ear normal without discharge Left Ear: External ear normal without discharge Nose: Nose without discharge or deformity Mouth/Throat: Oropharynx is without other ulcerations and moist , tongue with diffuse white rash without swelling, with a concentrated area nontender approx 1/2x1/2 cm to mid post dorsal tongue, no other lesions or ulcers or swelling Neck:  Normal range of motion. Neck supple. No JVD present. No tracheal deviation present or significant neck LA or mass Cardiovascular: Normal rate, regular rhythm, normal heart sounds and intact distal pulses.   Pulmonary/Chest: WOB normal and breath sounds without rales or wheezing  Abdominal: Soft. Bowel sounds are normal. NT. No HSM  Musculoskeletal: Normal range of motion. Exhibits no edema Lymphadenopathy: Has no other cervical adenopathy.  Neurological: Pt is alert and oriented to person, place, and time. Pt has normal reflexes. No cranial nerve deficit. Motor grossly intact, Gait intact Skin: Skin is warm and dry. No rash noted or new ulcerations Psychiatric:  Has normal mood and affect. Behavior is normal without agitation No other exam findings Lab Results  Component Value Date   WBC 5.6 06/14/2016   HGB 11.2 (L) 06/14/2016   HCT 34.8 (L) 06/14/2016   PLT 250 06/14/2016   GLUCOSE 101 (H) 06/14/2016   CHOL 109 11/10/2015   TRIG 81.0 11/10/2015   HDL 41.60 11/10/2015   LDLCALC 51 11/10/2015   ALT 13 (L) 06/14/2016   AST 19 06/14/2016   NA 140 06/14/2016   K 3.8 06/14/2016   CL 111 06/14/2016   CREATININE 0.85 06/14/2016   BUN 14 06/14/2016   CO2 21 (L) 06/14/2016   TSH 2.56 11/10/2015   PSA 3.80 11/10/2015   INR 1.17 06/14/2016   HGBA1C 5.3 04/18/2016   MICROALBUR 12.4 (H) 11/10/2015         Assessment & Plan:

## 2017-01-27 NOTE — Assessment & Plan Note (Addendum)
Exam c/w likely thrush, Mild to mod, for antibx course,  to f/u any worsening symptoms or concerns  In addition to the time spent performing CPE, I spent an additional 15 minutes face to face,in which greater than 50% of this time was spent in counseling and coordination of care for patient's illness as documented, including the differential dx, tx, further evaluation and other management of tongue lesion/rash, hyperglycemia and HTN

## 2017-01-27 NOTE — Assessment & Plan Note (Signed)
stable overall by history and exam, recent data reviewed with pt, and pt to continue medical treatment as before,  to f/u any worsening symptoms or concerns BP Readings from Last 3 Encounters:  01/26/17 120/64  01/09/17 120/70  01/03/17 131/65

## 2017-01-27 NOTE — Assessment & Plan Note (Signed)
Asympt,  Lab Results  Component Value Date   HGBA1C 5.3 04/18/2016  stable overall by history and exam, recent data reviewed with pt, and pt to continue medical treatment as before,  to f/u any worsening symptoms or concerns

## 2017-01-27 NOTE — Assessment & Plan Note (Signed)

## 2017-02-01 ENCOUNTER — Ambulatory Visit (INDEPENDENT_AMBULATORY_CARE_PROVIDER_SITE_OTHER): Payer: Medicare HMO | Admitting: Specialist

## 2017-02-01 ENCOUNTER — Encounter (INDEPENDENT_AMBULATORY_CARE_PROVIDER_SITE_OTHER): Payer: Self-pay | Admitting: Specialist

## 2017-02-01 VITALS — BP 138/71 | HR 64 | Ht 72.0 in | Wt 300.0 lb

## 2017-02-01 DIAGNOSIS — M533 Sacrococcygeal disorders, not elsewhere classified: Secondary | ICD-10-CM

## 2017-02-01 MED ORDER — HYDROCODONE-ACETAMINOPHEN 10-325 MG PO TABS: 1.0000 | ORAL_TABLET | Freq: Four times a day (QID) | ORAL | 0 refills | Status: DC | PRN

## 2017-02-01 NOTE — Patient Instructions (Signed)
The main ways of treat osteoarthritis, that are found to be success. Weight loss helps to decrease pain. Exercise is important to maintaining cartilage and thickness and strengthening. NSAIDs like ibuprofen tylenol,are meds decreasing the inflamation. Ice is okay  In afternoon and evening and hot shower in the am

## 2017-02-01 NOTE — Progress Notes (Signed)
Office Visit Note   Patient: Travis Dettman Sr.           Date of Birth: June 06, 1950           MRN: [id number] Visit Date: 02/01/2017              Requested by: [name], MD [address] [place], [place] [address] PCP: [name], MD   Assessment & Plan: Visit Diagnoses:  1. Pain of left sacroiliac joint     Plan:The main ways of treat osteoarthritis, that are found to be success. Weight loss helps to decrease pain. Exercise is important to maintaining cartilage and thickness and strengthening. NSAIDs like motrin, tylenol, alleve are meds decreasing the inflamation. Ice is okay  In afternoon and evening and hot shower in the am Please call a month ahead of follow up appt so we can order new Synvisc One material.   Follow-Up Instructions: No Follow-up on file.   Orders:  No orders of the defined types were placed in this encounter.  No orders of the defined types were placed in this encounter.     Procedures: No procedures performed   Clinical Data: No additional findings.   Subjective: Chief Complaint  Patient presents with  . Lower Back - Follow-up    He had Left L4-5, L5-S1 Facet injections on 01/03/17 and Left Sacroiliac joint injection on 01/17/17 with Dr. Ernestina Patches.    66 year old male with history of lumbar spinal stenosis and previous Central laminectomies L3-4 and L4-5 01/2014 and right ankle reconstruction with eventual right BKA for severe right ankle and foot deformities with end stage arthrosis. He feels better with the amputation. Not tried to ambulate on the right leg stump. Weakness left leg, but no numbness, no bowel or bladder difficuties. Pain in the left hip and buttock completely relieved with left SI joint injection but only lasted about 2-3 days.     Review of Systems  Constitutional: Positive for unexpected weight change. Negative for activity change, appetite change, chills, diaphoresis, fatigue and fever.  HENT: Positive for  mouth sores. Negative for dental problem, drooling, ear discharge, ear pain, facial swelling, hearing loss, rhinorrhea, sinus pain, sinus pressure, sneezing, sore throat, tinnitus, trouble swallowing and voice change.   Eyes: Positive for visual disturbance.  Respiratory: Negative.   Cardiovascular: Negative for chest pain, palpitations and leg swelling.  Gastrointestinal: Negative for abdominal distention, abdominal pain, diarrhea, nausea and rectal pain.  Genitourinary: Negative.   Musculoskeletal: Positive for back pain. Negative for neck pain and neck stiffness.  Skin: Negative for color change, pallor, rash and wound.  Neurological: Negative for dizziness, tremors, seizures, syncope, facial asymmetry, speech difficulty, weakness, light-headedness, numbness and headaches.  Hematological: Does not bruise/bleed easily.  Psychiatric/Behavioral: Negative for agitation, behavioral problems, confusion, decreased concentration, dysphoric mood, hallucinations, self-injury, sleep disturbance and suicidal ideas. The patient is not nervous/anxious and is not hyperactive.      Objective: Vital Signs: BP 138/71 (BP Location: Left Arm, Patient Position: Sitting)   Pulse 64   Ht 6' (1.829 m)   Wt 300 lb (136.1 kg)   BMI 40.69 kg/m   Physical Exam  Constitutional: He is oriented to person, place, and time. He appears well-developed and well-nourished.  HENT:  Head: Normocephalic and atraumatic.  Eyes: Pupils are equal, round, and reactive to light. EOM are normal.  Neck: Normal range of motion. Neck supple.  Pulmonary/Chest: Effort normal and breath sounds normal.  Abdominal: Soft.  Bowel sounds are normal.  Musculoskeletal: Normal range of motion.  Neurological: He is alert and oriented to person, place, and time.  Skin: Skin is warm and dry.  Psychiatric: He has a normal mood and affect. His behavior is normal. Judgment and thought content normal.    Ortho Exam  Specialty Comments:  No  specialty comments available.  Imaging: No results found.   PMFS History: Patient Active Problem List   Diagnosis Date Noted  . Spinal stenosis in cervical region 09/26/2013    Priority: High    Class: Chronic  . Spinal stenosis, lumbar region, with neurogenic claudication 09/26/2013    Priority: High    Class: Chronic  . Tongue lesion 01/26/2017  . Acute sinus infection 10/20/2016  . Hyperglycemia 10/20/2016  . Skin lesion 10/20/2016  . S/P unilateral BKA (below knee amputation), right ([place]) 06/14/2016  . Charcot foot due to diabetes mellitus ([place])   . Charcot's arthropathy associated with type 2 diabetes mellitus ([place]) 04/11/2016  . Encounter for well adult exam with abnormal findings 11/05/2015  . Major depression 09/13/2015  . S/P TKR (total knee replacement) bilaterally 09/13/2015  . GERD (gastroesophageal reflux disease) 09/08/2015  . S/P laparoscopic hernia repair 05/11/2015  . PPD positive 04/08/2015  . Benign neoplasm of descending colon   . Benign neoplasm of cecum   . AKI (acute kidney injury) ([place]) 10/18/2014  . Acute blood loss anemia   . Chronic anticoagulation   . Occult blood positive stool 10/17/2014  . General weakness 07/14/2014  . Urinary incontinence 07/14/2014  . Headache(784.0) 10/15/2013  . Hand joint pain 06/10/2013  . Rotator cuff tear arthropathy of both shoulders 06/10/2013  . Skin lesion of cheek 05/01/2013  . Pain of right thumb 04/03/2013  . Balance disorder 03/12/2013  . Gait disorder 03/12/2013  . Tremor 03/12/2013  . Left hip pain 03/12/2013  . Pre-ulcerative corn or callous 02/06/2013  . Anxiety 11/12/2011  . OSA (obstructive sleep apnea) 11/07/2011  . Bradycardia 10/20/2011  . Insomnia 10/04/2011  . Obesity 01/12/2011  . ERECTILE DYSFUNCTION, ORGANIC 05/30/2010  . [place] DISEASE, LUMBAR 04/19/2010  . SCIATICA, LEFT 04/19/2010  . Chronic pain syndrome 10/27/2009  . Hyperlipidemia 07/15/2009  . Essential hypertension 06/24/2009    . CAD (coronary artery disease) 06/24/2009  . Allergic rhinitis 06/24/2009  . URETHRAL STRICTURE 06/24/2009  . DEGENERATIVE JOINT DISEASE 06/24/2009  . SHOULDER PAIN, BILATERAL 06/24/2009  . FATIGUE 06/24/2009  . NEPHROLITHIASIS, HX OF 06/24/2009   Past Medical History:  Diagnosis Date  . ALLERGIC RHINITIS 06/24/2009  . Allergic rhinitis 06/24/2009   Qualifier: Diagnosis of  By: [name], [name]   . Anxiety 11/12/2011   Adequate for discharge   . Arthritis    "all my joints" (09/30/2013)  . Arthritis of foot, right, degenerative 04/15/2014  . Balance disorder 03/12/2013  . Benign neoplasm of cecum   . Benign neoplasm of descending colon   . CAD (coronary artery disease) 06/24/2009   5 stents placed in 2007    . Chronic anticoagulation   . Chronic pain syndrome 10/27/2009   of ankle, shoulders, low back.  sciatica.   . Closed fracture of right foot 10/17/2014  . CORONARY ARTERY DISEASE 06/24/2009   a. s/p multiple PCIs - In 2008 he had a Taxus DES to the mild LAD, Endeavor DES to mid LCX and distal LCX. In January 2009 he had DES to distal LCX, mid LCX and proximal LCX. In November 2009 had BMS x 2 to  the mid RCA. Cath 10/2011 with patent stents, noncardiac CP. LHC 01/2013: patent stents (noncardiac CP).  . DEGENERATIVE JOINT DISEASE 06/24/2009   Qualifier: Diagnosis of  By: [name], [name]   . Depression   . Depression with anxiety    Prior suicide attempt  . [place] DISEASE, LUMBAR 04/19/2010  . ERECTILE DYSFUNCTION, ORGANIC 05/30/2010  . Essential hypertension 06/24/2009   Qualifier: Diagnosis of  By: [name], [name]   . Fibromyalgia   . Fracture dislocation of ankle joint 09/02/2015  . Gait disorder 03/12/2013  . General weakness 07/14/2014  . GERD (gastroesophageal reflux disease) 09/08/2015  . Hand joint pain 06/10/2013  . Heart murmur   . Hepatitis C   . History of kidney stones   . Hyperlipidemia 07/15/2009   Qualifier: Diagnosis of  By: Aundra Dubin, MD, [place]    . HYPERLIPIDEMIA-MIXED  07/15/2009  . HYPERTENSION 06/24/2009  . Insomnia 10/04/2011  . Irregular heart beat   . Left hip pain 03/12/2013   Injected under ultrasound guidance on June 24, 2013   . Major depression 09/13/2015  . Myocardial infarction ([place]) 2008  . Non-cardiac chest pain 10/2011, 01/2013  . Obesity   . Occult blood positive stool 10/17/2014  . Open ankle fracture 09/02/2015  . OSA (obstructive sleep apnea)    not using CPAP (09/30/2013)  . Pain of right thumb 04/03/2013  . Pneumonia   . PPD positive 04/08/2015  . Pre-ulcerative corn or callous 02/06/2013  . Restless legs   . Rotator cuff tear arthropathy of both shoulders 06/10/2013   History of bilateral shoulder cuff surgery for rotator cuff tears. Reports increase in pain 09/11/2015 during physical therapy of the left shoulder.   [name] SCIATICA, LEFT 04/19/2010   Qualifier: Diagnosis of  By: [name], [name]   . Spinal stenosis in cervical region 09/26/2013  . Spinal stenosis, lumbar region, with neurogenic claudication 09/26/2013  . Type II diabetes mellitus ([place] [facility]) 2012   no meds in 09/2014.   [name] Uncontrolled type 2 DM with peripheral circulatory disorder ([facility]) 10/04/2013  . URETHRAL STRICTURE 06/24/2009   self catheterizes.     Family History  Problem Relation Age of Onset  . Depression Mother   . Heart disease Mother   . Hypertension Mother   . Cancer Mother        Breast  . Diabetes Father   . Heart disease Father        CABG  . Cancer Father        prostate and skin cancer  . Hypertension Father   . Hyperlipidemia Father   . Depression Brother        x 2  . Hypertension Brother        x2  . Heart disease Maternal Grandfather   . Early death Maternal Grandfather 49       heart attack  . Early death Paternal Grandfather   . Coronary artery disease Other   . Hypertension Other   . Depression Other     Past Surgical History:  Procedure Laterality Date  . AMPUTATION Right 06/14/2016   Procedure: AMPUTATION BELOW KNEE;  Surgeon: Newt Minion, MD;  Location: [place];  Service: Orthopedics;  Laterality: Right;  . ANKLE FUSION Right 04/15/2014   Procedure: Right Subtalar, Talonavicular Fusion;  Surgeon: Newt Minion, MD;  Location: [place];  Service: Orthopedics;  Laterality: Right;  . ANKLE FUSION Right 04/18/2016   Right Ankle Tibiocalcaneal Fusion; Removal of deep  retained hardware; application of wound vac/notes 04/18/2016  . ANKLE FUSION Right 04/18/2016   Procedure: Right Ankle Tibiocalcaneal Fusion;  Surgeon: Newt Minion, MD;  Location: [place];  Service: Orthopedics;  Laterality: Right;  . ANTERIOR CERVICAL DECOMP/DISCECTOMY FUSION N/A 09/26/2013   Procedure: ANTERIOR CERVICAL DISCECTOMY FUSION C3-4, plate and screw fixation, allograft bone graft;  Surgeon: Jessy Oto, MD;  Location: [place];  Service: Orthopedics;  Laterality: N/A;  . BACK SURGERY    . BELOW KNEE LEG AMPUTATION Right 06/14/2016  . CARDIAC CATHETERIZATION  X 1  . CARPAL TUNNEL RELEASE Bilateral   . COLONOSCOPY    . COLONOSCOPY N/A 10/22/2014   Procedure: COLONOSCOPY;  Surgeon: Lafayette Dragon, MD;  Location: [facility] ENDOSCOPY;  Service: Endoscopy;  Laterality: N/A;  . CORONARY ANGIOPLASTY WITH STENT PLACEMENT     "I have 9 stents"  . ESOPHAGOGASTRODUODENOSCOPY N/A 10/19/2014   Procedure: ESOPHAGOGASTRODUODENOSCOPY (EGD);  Surgeon: Jerene Bears, MD;  Location: [facility] ENDOSCOPY;  Service: Endoscopy;  Laterality: N/A;  . FUSION OF TALONAVICULAR JOINT Right 04/15/2014   dr duda  . HERNIA REPAIR     umbilical  . INGUINAL HERNIA REPAIR Right 05/11/2015   Procedure: LAPAROSCOPIC REPAIR RIGHT  INGUINAL HERNIA;  Surgeon: Greer Pickerel, MD;  Location: [place];  Service: General;  Laterality: Right;  . INSERTION OF MESH Right 05/11/2015   Procedure: INSERTION OF MESH;  Surgeon: Greer Pickerel, MD;  Location: [place];  Service: General;  Laterality: Right;  . JOINT REPLACEMENT Bilateral    knees  . KNEE CARTILAGE SURGERY Right X 12   "~ 1/2 open; ~ 1/2 scopes"  . KNEE CARTILAGE  SURGERY Left X 3   "3 scopes"  . LEFT HEART CATHETERIZATION WITH CORONARY ANGIOGRAM N/A 02/10/2013   Procedure: LEFT HEART CATHETERIZATION WITH CORONARY ANGIOGRAM;  Surgeon: Burnell Blanks, MD;  Location: [facility] CATH LAB;  Service: Cardiovascular;  Laterality: N/A;  . LUMBAR LAMINECTOMY/DECOMPRESSION MICRODISCECTOMY N/A 01/27/2014   Procedure: CENTRAL LUMBAR LAMINECTOMY L4-5 AND L3-4;  Surgeon: Jessy Oto, MD;  Location: [place];  Service: Orthopedics;  Laterality: N/A;  . ORIF ANKLE FRACTURE Right 09/02/2015   Procedure: OPEN REDUCTION INTERNAL FIXATION (ORIF) ANKLE FRACTURE;  Surgeon: Newt Minion, MD;  Location: [place];  Service: Orthopedics;  Laterality: Right;  . PERIPHERALLY INSERTED CENTRAL CATHETER INSERTION  09/02/2015  . SHOULDER ARTHROSCOPY W/ ROTATOR CUFF REPAIR Bilateral    "3 on the right; 1 on the left"  . SKIN SPLIT GRAFT Right 10/01/2015   Procedure: RIGHT ANKLE APPLY SKIN GRAFT SPLIT THICKNESS;  Surgeon: Newt Minion, MD;  Location: [place];  Service: Orthopedics;  Laterality: Right;  . TONSILLECTOMY    . TOTAL KNEE ARTHROPLASTY Bilateral 2008  . UMBILICAL HERNIA REPAIR     UHR  . URETHRAL DILATION  X 4  . VASECTOMY     Social History   Occupational History  . disabled since 2006 due to ortho. heart, psych Unemployed  . part time work as an Multimedia programmer, wrestling, and baseball Agricultural consultant    Social History Main Topics  . Smoking status: Former Smoker    Types: Cigars    Quit date: 08/28/2010  . Smokeless tobacco: Never Used     Comment: 04/18/2016 "smoked 1 cigar/wk when I did smoke"  . Alcohol use No     Comment: rarely  . Drug use: No  . Sexual activity: Not Currently

## 2017-02-01 NOTE — Progress Notes (Deleted)
Office Visit Note   Patient: Travis Turay Sr.           Date of Birth: 10/05/1950           MRN: [id number] Visit Date: 02/01/2017              Requested by: [name], MD [facility], [place] [address] PCP: [name], MD   Assessment & Plan: Visit Diagnoses: No diagnosis found.  Plan: ***  Follow-Up Instructions: No Follow-up on file.   Orders:  No orders of the defined types were placed in this encounter.  No orders of the defined types were placed in this encounter.     Procedures: No procedures performed   Clinical Data: No additional findings.   Subjective: Chief Complaint  Patient presents with  . Lower Back - Follow-up    He had Left L4-5, L5-S1 Facet injections on 01/03/17 and Left Sacroiliac joint injection on 01/17/17 with Dr. Ernestina Patches.    HPI  Review of Systems   Objective: Vital Signs: BP 138/71 (BP Location: Left Arm, Patient Position: Sitting)   Pulse 64   Ht 6' (1.829 m)   Wt 300 lb (136.1 kg)   BMI 40.69 kg/m   Physical Exam  Ortho Exam  Specialty Comments:  No specialty comments available.  Imaging: No results found.   PMFS History: Patient Active Problem List   Diagnosis Date Noted  . Spinal stenosis in cervical region 09/26/2013    Priority: High    Class: Chronic  . Spinal stenosis, lumbar region, with neurogenic claudication 09/26/2013    Priority: High    Class: Chronic  . Tongue lesion 01/26/2017  . Acute sinus infection 10/20/2016  . Hyperglycemia 10/20/2016  . Skin lesion 10/20/2016  . S/P unilateral BKA (below knee amputation), right ([place]) 06/14/2016  . Charcot foot due to diabetes mellitus ([place])   . Charcot's arthropathy associated with type 2 diabetes mellitus ([place]) 04/11/2016  . Encounter for well adult exam with abnormal findings 11/05/2015  . Major depression 09/13/2015  . S/P TKR (total knee replacement) bilaterally 09/13/2015  . GERD (gastroesophageal reflux disease) 09/08/2015   . S/P laparoscopic hernia repair 05/11/2015  . PPD positive 04/08/2015  . Benign neoplasm of descending colon   . Benign neoplasm of cecum   . AKI (acute kidney injury) ([place]) 10/18/2014  . Acute blood loss anemia   . Chronic anticoagulation   . Occult blood positive stool 10/17/2014  . General weakness 07/14/2014  . Urinary incontinence 07/14/2014  . Headache(784.0) 10/15/2013  . Hand joint pain 06/10/2013  . Rotator cuff tear arthropathy of both shoulders 06/10/2013  . Skin lesion of cheek 05/01/2013  . Pain of right thumb 04/03/2013  . Balance disorder 03/12/2013  . Gait disorder 03/12/2013  . Tremor 03/12/2013  . Left hip pain 03/12/2013  . Pre-ulcerative corn or callous 02/06/2013  . Anxiety 11/12/2011  . OSA (obstructive sleep apnea) 11/07/2011  . Bradycardia 10/20/2011  . Insomnia 10/04/2011  . Obesity 01/12/2011  . ERECTILE DYSFUNCTION, ORGANIC 05/30/2010  . [place] DISEASE, LUMBAR 04/19/2010  . SCIATICA, LEFT 04/19/2010  . Chronic pain syndrome 10/27/2009  . Hyperlipidemia 07/15/2009  . Essential hypertension 06/24/2009  . CAD (coronary artery disease) 06/24/2009  . Allergic rhinitis 06/24/2009  . URETHRAL STRICTURE 06/24/2009  . DEGENERATIVE JOINT DISEASE 06/24/2009  . SHOULDER PAIN, BILATERAL 06/24/2009  . FATIGUE 06/24/2009  . NEPHROLITHIASIS, HX OF 06/24/2009   Past Medical History:  Diagnosis Date  .  ALLERGIC RHINITIS 06/24/2009  . Allergic rhinitis 06/24/2009   Qualifier: Diagnosis of  By: [name], [name]   . Anxiety 11/12/2011   Adequate for discharge   . Arthritis    "all my joints" (09/30/2013)  . Arthritis of foot, right, degenerative 04/15/2014  . Balance disorder 03/12/2013  . Benign neoplasm of cecum   . Benign neoplasm of descending colon   . CAD (coronary artery disease) 06/24/2009   5 stents placed in 2007    . Chronic anticoagulation   . Chronic pain syndrome 10/27/2009   of ankle, shoulders, low back.  sciatica.   . Closed fracture of right  foot 10/17/2014  . CORONARY ARTERY DISEASE 06/24/2009   a. s/p multiple PCIs - In 2008 he had a Taxus DES to the mild LAD, Endeavor DES to mid LCX and distal LCX. In January 2009 he had DES to distal LCX, mid LCX and proximal LCX. In November 2009 had BMS x 2 to the mid RCA. Cath 10/2011 with patent stents, noncardiac CP. LHC 01/2013: patent stents (noncardiac CP).  . DEGENERATIVE JOINT DISEASE 06/24/2009   Qualifier: Diagnosis of  By: [name], [name]   . Depression   . Depression with anxiety    Prior suicide attempt  . [name] DISEASE, LUMBAR 04/19/2010  . ERECTILE DYSFUNCTION, ORGANIC 05/30/2010  . Essential hypertension 06/24/2009   Qualifier: Diagnosis of  By: [name], [name]   . Fibromyalgia   . Fracture dislocation of ankle joint 09/02/2015  . Gait disorder 03/12/2013  . General weakness 07/14/2014  . GERD (gastroesophageal reflux disease) 09/08/2015  . Hand joint pain 06/10/2013  . Heart murmur   . Hepatitis C   . History of kidney stones   . Hyperlipidemia 07/15/2009   Qualifier: Diagnosis of  By: Aundra Dubin, MD, [place]    . HYPERLIPIDEMIA-MIXED 07/15/2009  . HYPERTENSION 06/24/2009  . Insomnia 10/04/2011  . Irregular heart beat   . Left hip pain 03/12/2013   Injected under ultrasound guidance on June 24, 2013   . Major depression 09/13/2015  . Myocardial infarction ([place]) 2008  . Non-cardiac chest pain 10/2011, 01/2013  . Obesity   . Occult blood positive stool 10/17/2014  . Open ankle fracture 09/02/2015  . OSA (obstructive sleep apnea)    not using CPAP (09/30/2013)  . Pain of right thumb 04/03/2013  . Pneumonia   . PPD positive 04/08/2015  . Pre-ulcerative corn or callous 02/06/2013  . Restless legs   . Rotator cuff tear arthropathy of both shoulders 06/10/2013   History of bilateral shoulder cuff surgery for rotator cuff tears. Reports increase in pain 09/11/2015 during physical therapy of the left shoulder.   [name] SCIATICA, LEFT 04/19/2010   Qualifier: Diagnosis of  By: [name], [name]     . Spinal stenosis in cervical region 09/26/2013  . Spinal stenosis, lumbar region, with neurogenic claudication 09/26/2013  . Type II diabetes mellitus ([place]) 2012   no meds in 09/2014.   [name] Uncontrolled type 2 DM with peripheral circulatory disorder ([place]) 10/04/2013  . URETHRAL STRICTURE 06/24/2009   self catheterizes.     Family History  Problem Relation Age of Onset  . Depression Mother   . Heart disease Mother   . Hypertension Mother   . Cancer Mother        Breast  . Diabetes Father   . Heart disease Father        CABG  . Cancer Father  prostate and skin cancer  . Hypertension Father   . Hyperlipidemia Father   . Depression Brother        x 2  . Hypertension Brother        x2  . Heart disease Maternal Grandfather   . Early death Maternal Grandfather 8       heart attack  . Early death Paternal Grandfather   . Coronary artery disease Other   . Hypertension Other   . Depression Other     Past Surgical History:  Procedure Laterality Date  . AMPUTATION Right 06/14/2016   Procedure: AMPUTATION BELOW KNEE;  Surgeon: Newt Minion, MD;  Location: [place];  Service: Orthopedics;  Laterality: Right;  . ANKLE FUSION Right 04/15/2014   Procedure: Right Subtalar, Talonavicular Fusion;  Surgeon: Newt Minion, MD;  Location: [place];  Service: Orthopedics;  Laterality: Right;  . ANKLE FUSION Right 04/18/2016   Right Ankle Tibiocalcaneal Fusion; Removal of deep retained hardware; application of wound vac/notes 04/18/2016  . ANKLE FUSION Right 04/18/2016   Procedure: Right Ankle Tibiocalcaneal Fusion;  Surgeon: Newt Minion, MD;  Location: [place];  Service: Orthopedics;  Laterality: Right;  . ANTERIOR CERVICAL DECOMP/DISCECTOMY FUSION N/A 09/26/2013   Procedure: ANTERIOR CERVICAL DISCECTOMY FUSION C3-4, plate and screw fixation, allograft bone graft;  Surgeon: Jessy Oto, MD;  Location: [place];  Service: Orthopedics;  Laterality: N/A;  . BACK SURGERY    . BELOW KNEE LEG AMPUTATION  Right 06/14/2016  . CARDIAC CATHETERIZATION  X 1  . CARPAL TUNNEL RELEASE Bilateral   . COLONOSCOPY    . COLONOSCOPY N/A 10/22/2014   Procedure: COLONOSCOPY;  Surgeon: Lafayette Dragon, MD;  Location: [facility] ENDOSCOPY;  Service: Endoscopy;  Laterality: N/A;  . CORONARY ANGIOPLASTY WITH STENT PLACEMENT     "I have 9 stents"  . ESOPHAGOGASTRODUODENOSCOPY N/A 10/19/2014   Procedure: ESOPHAGOGASTRODUODENOSCOPY (EGD);  Surgeon: Jerene Bears, MD;  Location: [facility] ENDOSCOPY;  Service: Endoscopy;  Laterality: N/A;  . FUSION OF TALONAVICULAR JOINT Right 04/15/2014   dr duda  . HERNIA REPAIR     umbilical  . INGUINAL HERNIA REPAIR Right 05/11/2015   Procedure: LAPAROSCOPIC REPAIR RIGHT  INGUINAL HERNIA;  Surgeon: Greer Pickerel, MD;  Location: [place];  Service: General;  Laterality: Right;  . INSERTION OF MESH Right 05/11/2015   Procedure: INSERTION OF MESH;  Surgeon: Greer Pickerel, MD;  Location: [place];  Service: General;  Laterality: Right;  . JOINT REPLACEMENT Bilateral    knees  . KNEE CARTILAGE SURGERY Right X 12   "~ 1/2 open; ~ 1/2 scopes"  . KNEE CARTILAGE SURGERY Left X 3   "3 scopes"  . LEFT HEART CATHETERIZATION WITH CORONARY ANGIOGRAM N/A 02/10/2013   Procedure: LEFT HEART CATHETERIZATION WITH CORONARY ANGIOGRAM;  Surgeon: Burnell Blanks, MD;  Location: [facility] CATH LAB;  Service: Cardiovascular;  Laterality: N/A;  . LUMBAR LAMINECTOMY/DECOMPRESSION MICRODISCECTOMY N/A 01/27/2014   Procedure: CENTRAL LUMBAR LAMINECTOMY L4-5 AND L3-4;  Surgeon: Jessy Oto, MD;  Location: [place];  Service: Orthopedics;  Laterality: N/A;  . ORIF ANKLE FRACTURE Right 09/02/2015   Procedure: OPEN REDUCTION INTERNAL FIXATION (ORIF) ANKLE FRACTURE;  Surgeon: Newt Minion, MD;  Location: [place];  Service: Orthopedics;  Laterality: Right;  . PERIPHERALLY INSERTED CENTRAL CATHETER INSERTION  09/02/2015  . SHOULDER ARTHROSCOPY W/ ROTATOR CUFF REPAIR Bilateral    "3 on the right; 1 on the left"  . SKIN SPLIT GRAFT Right  10/01/2015   Procedure: RIGHT  ANKLE APPLY SKIN GRAFT SPLIT THICKNESS;  Surgeon: Newt Minion, MD;  Location: [place];  Service: Orthopedics;  Laterality: Right;  . TONSILLECTOMY    . TOTAL KNEE ARTHROPLASTY Bilateral 2008  . UMBILICAL HERNIA REPAIR     UHR  . URETHRAL DILATION  X 4  . VASECTOMY     Social History   Occupational History  . disabled since 2006 due to ortho. heart, psych Unemployed  . part time work as an Multimedia programmer, wrestling, and baseball Agricultural consultant    Social History Main Topics  . Smoking status: Former Smoker    Types: Cigars    Quit date: 08/28/2010  . Smokeless tobacco: Never Used     Comment: 04/18/2016 "smoked 1 cigar/wk when I did smoke"  . Alcohol use No     Comment: rarely  . Drug use: No  . Sexual activity: Not Currently

## 2017-02-02 ENCOUNTER — Other Ambulatory Visit: Payer: Self-pay | Admitting: Internal Medicine

## 2017-02-07 ENCOUNTER — Telehealth: Payer: Self-pay | Admitting: Internal Medicine

## 2017-02-07 NOTE — Telephone Encounter (Signed)
Pt called for a refill of his metFORMIN (GLUCOPHAGE) 500 MG tablet [facility]

## 2017-02-07 NOTE — Telephone Encounter (Signed)
Notified pt w/MD response pt will come back to have labs done...Travis [name]

## 2017-02-07 NOTE — Telephone Encounter (Signed)
Pls advise if ok to refill per chart med has not been refilled since 02/2015...Travis [name]

## 2017-02-07 NOTE — Telephone Encounter (Signed)
Sorry I cannot, because pt has not had labs as requested to follow up with DM

## 2017-02-14 ENCOUNTER — Ambulatory Visit (INDEPENDENT_AMBULATORY_CARE_PROVIDER_SITE_OTHER): Payer: Medicare HMO | Admitting: Physical Medicine and Rehabilitation

## 2017-02-14 ENCOUNTER — Telehealth (INDEPENDENT_AMBULATORY_CARE_PROVIDER_SITE_OTHER): Payer: Self-pay | Admitting: Physical Medicine and Rehabilitation

## 2017-02-14 ENCOUNTER — Encounter (INDEPENDENT_AMBULATORY_CARE_PROVIDER_SITE_OTHER): Payer: Self-pay | Admitting: Physical Medicine and Rehabilitation

## 2017-02-14 VITALS — BP 140/73 | HR 67

## 2017-02-14 DIAGNOSIS — M47816 Spondylosis without myelopathy or radiculopathy, lumbar region: Secondary | ICD-10-CM

## 2017-02-14 NOTE — Progress Notes (Deleted)
Here to discuss RFA. Injection in the past here have been helpful, but they do not seem to last. Has had RFA in the past by another provider, and reports that this did not help or did not seem to be in right area.  Patient was severe axial left-sided low back pain into the upper buttock but without radicular pain. He has a right below-the-knee amputation as well as significant arthritis of the right hip. He has had prior lumbar surgery. He has significant pelvic obliquity due to shortening right lower extremity and difficulty with the amputation and prosthesis. He was seeing [facility] and may have had a prior radiofrequency ablation but this did not help. He is unsure which levels were performed.

## 2017-02-16 ENCOUNTER — Other Ambulatory Visit: Payer: Self-pay | Admitting: Internal Medicine

## 2017-02-16 ENCOUNTER — Encounter (INDEPENDENT_AMBULATORY_CARE_PROVIDER_SITE_OTHER): Payer: Self-pay | Admitting: Physical Medicine and Rehabilitation

## 2017-02-16 NOTE — Progress Notes (Signed)
Travis Carattini Sr. - 66 y.o. male MRN [id number]  Date of birth: 09/12/1950  Office Visit Note: Visit Date: 02/14/2017 PCP: [name], MD Referred by: [name], MD  Subjective: Chief Complaint  Patient presents with  . Lower Back - Pain   HPI: [name] is a 66 year old gentleman who is followed by Dr. Louanne Skye in our office. We have seen him on 2 occasions 1 for diagnostic facet joint blocks at L4-5 and L5-S1 which gave him quite a bit relief of his left lower back pain but it was short-lived relief and this is documented. He also from a differential standpoint underwent sacroiliac joint injection with no relief. His history is fairly complicated with prior lumbar surgery without fusion. MRI is reviewed below. He does have significant left-sided facet arthropathy at L4-5 and L5-S1. Has some residual disc at L4 as well. Really does not complain of radicular pain. He has had multiple bouts of therapy. He is actually been in pain management in the past. He was seen [facility].  Evidently because of insurance difficulties he cannot see them anymore. Dr. Louanne Skye has provided care. He reports prior radiofrequency ablation at some level but he is unsure of. He reported that this did not help at that time. He is really unsure which side at this was performed on. We do not have those notes. He has tried medication management with opioids and other medications without much relief. His case is complicated by right-sided lower extremity amputation. He also has significant hip arthritis on the right. His leg length discrepancy and pelvic obliquity. He rates his pain is very severe and fairly constant. Case further complicated by significant type 2 diabetes.    Review of Systems  Constitutional: Negative for chills, fever, malaise/fatigue and weight loss.  HENT: Negative for hearing loss and sinus pain.   Eyes: Negative for blurred vision, double vision and photophobia.  Respiratory:  Negative for cough and shortness of breath.   Cardiovascular: Negative for chest pain, palpitations and leg swelling.  Gastrointestinal: Negative for abdominal pain, nausea and vomiting.  Genitourinary: Negative for flank pain.  Musculoskeletal: Positive for back pain and joint pain. Negative for myalgias.  Skin: Negative for itching and rash.  Neurological: Negative for tremors, focal weakness and weakness.  Endo/Heme/Allergies: Negative.   Psychiatric/Behavioral: Negative for depression.  All other systems reviewed and are negative.  Otherwise per HPI.  Assessment & Plan: Visit Diagnoses:  1. Spondylosis without myelopathy or radiculopathy, lumbar region     Plan: Findings:  Chronic worsening recalcitrant left-sided axial low back pain referral to the upper buttock but no leg pain. MRI evidence of facet arthropathy at L4-5 and L5-S1 fairly severe. This is worse on the left than right in his pain is concordant. Exam shows pain with extension. Prior diagnostic blocks were successful this was documented. Differential block of the SI joint was not beneficial. We are going to seek approval for second set of diagnostic blocks. If those are beneficial and we would look at radiofrequency ablation. He hopefully would be a good candidate for longer-term relief with that definitively. If he does not get relief with the second set of blocks. He will return to seek care with Dr. Louanne Skye. He will continue current medications and activity level.    Meds & Orders: No orders of the defined types were placed in this encounter.  No orders of the defined types were placed in this encounter.   Follow-up: Return for Left L4-5  and L5-S1 diagnostic facet block.   Procedures: No procedures performed  No notes on file   Clinical History: MRI LUMBAR SPINE WITHOUT AND WITH CONTRAST  TECHNIQUE: Multiplanar and multiecho pulse sequences of the lumbar spine were obtained without and with intravenous  contrast.  CONTRAST:  86m MULTIHANCE GADOBENATE DIMEGLUMINE 529 MG/ML IV SOLN  [place] [address]  Results: Creatinine 0.8 mg/dL.  COMPARISON:  MRI lumbar spine 03/17/2014.  Plain films 04/23/2014.  FINDINGS: Segmentation: Normal.  Alignment: Trace retrolisthesis L2-3 is facet mediated. Similar trace anterolisthesis L4-5 postsurgical and facet mediated.  Vertebrae: No worrisome osseous lesion.  Conus medullaris: Normal in size, signal, and location.  Paraspinal tissues: No evidence for hydronephrosis or paravertebral mass.  Disc levels:  L1-L2: Central and rightward protrusion. Osseous spurring to the RIGHT. Mild facet arthropathy. RIGHT L1 and RIGHT L2 nerve root impingement are possible.  L2-L3: Disc space narrowing. Disc osteophyte formation with annular bulging. Prominent Schmorl's node extends downward to the LEFT. Posterior element hypertrophy. Slight subarticular zone narrowing is worse on the LEFT. LEFT L3 nerve root impingement is possible.  L3-L4: Mild bulge. Moderate posterior element hypertrophy. Mild stenosis. Either L4 nerve root could be affected.  L4-L5: Central and leftward protrusion extends to the foramen. An additional component extends into the subarticular zone and foraminal zone on the RIGHT. Asymmetric facet arthropathy worse on the LEFT. Adequate posterior decompression. BILATERAL L4 and L5 nerve root impingement are likely.  L5-S1: Severe disc space narrowing. Mild facet arthropathy. No impingement.  Post infusion, no worrisome postcontrast enhancement. Peridiscal enhancement is most notable at L4-5 on the RIGHT.  Compared with priors, the previously noted fluid collection has resolved.  IMPRESSION: Postsurgical changes at L4-5, with residual/recurrent disc material narrowing the subarticular zone and foraminal zone bilaterally. L4 and L5 nerve root  impingement likely.  Severe disc space narrowing L5-S1 is stable. No definite impingement this level.  Asymmetric spondylosis at L2-3 on the LEFT at L1-2 on the RIGHT could affect the subarticular and foraminal nerve roots at those levels, respectively.  Mild stenosis at L3-4 is multifactorial. Annular bulging combined with posterior element hypertrophy could affect the exiting L4 nerve roots.   Electronically Signed   By: JStaci RighterM.D.   On: 08/30/2015 15:44  He reports that he quit smoking about 6 years ago. His smoking use included Cigars. He has never used smokeless tobacco.   Recent Labs  04/18/16 0830  HGBA1C 5.3    Objective:  VS:  HT:    WT:   BMI:     BP:140/73  HR:67bpm  TEMP: ( )  RESP:  Physical Exam  Constitutional: He is oriented to person, place, and time. He appears well-developed and well-nourished. No distress.  HENT:  Head: Normocephalic and atraumatic.  Eyes: Pupils are equal, round, and reactive to light. Conjunctivae are normal.  Neck: Normal range of motion. Neck supple.  Cardiovascular: Regular rhythm and intact distal pulses.   Pulmonary/Chest: Effort normal. No respiratory distress.  Musculoskeletal:  Patient ambulates with a cane. He has a right below the knee amputation with prosthesis. He does have a leg length discrepancy and painful right-sided antalgic gait. He has pain with extension of the lumbar spine. He has good distal strength on the left.  Neurological: He is alert and oriented to person, place, and time. He exhibits normal muscle tone.  Skin: Skin is warm and dry. No rash noted. No erythema.  Psychiatric: He  has a normal mood and affect.  Nursing note and vitals reviewed.   Ortho Exam Imaging: No results found.  Past Medical/Family/Surgical/Social History: Medications & Allergies reviewed per EMR Patient Active Problem List   Diagnosis Date Noted  . Tongue lesion 01/26/2017  . Acute sinus infection 10/20/2016   . Hyperglycemia 10/20/2016  . Skin lesion 10/20/2016  . S/P unilateral BKA (below knee amputation), right ([place]) 06/14/2016  . Charcot foot due to diabetes mellitus ([place])   . Charcot's arthropathy associated with type 2 diabetes mellitus ([place]) 04/11/2016  . Encounter for well adult exam with abnormal findings 11/05/2015  . Major depression 09/13/2015  . S/P TKR (total knee replacement) bilaterally 09/13/2015  . GERD (gastroesophageal reflux disease) 09/08/2015  . S/P laparoscopic hernia repair 05/11/2015  . PPD positive 04/08/2015  . Benign neoplasm of descending colon   . Benign neoplasm of cecum   . AKI (acute kidney injury) ([place]) 10/18/2014  . Acute blood loss anemia   . Chronic anticoagulation   . Occult blood positive stool 10/17/2014  . General weakness 07/14/2014  . Urinary incontinence 07/14/2014  . Headache(784.0) 10/15/2013  . Spinal stenosis in cervical region 09/26/2013    Class: Chronic  . Spinal stenosis, lumbar region, with neurogenic claudication 09/26/2013    Class: Chronic  . Hand joint pain 06/10/2013  . Rotator cuff tear arthropathy of both shoulders 06/10/2013  . Skin lesion of cheek 05/01/2013  . Pain of right thumb 04/03/2013  . Balance disorder 03/12/2013  . Gait disorder 03/12/2013  . Tremor 03/12/2013  . Left hip pain 03/12/2013  . Pre-ulcerative corn or callous 02/06/2013  . Anxiety 11/12/2011  . OSA (obstructive sleep apnea) 11/07/2011  . Bradycardia 10/20/2011  . Insomnia 10/04/2011  . Obesity 01/12/2011  . ERECTILE DYSFUNCTION, ORGANIC 05/30/2010  . [place] DISEASE, LUMBAR 04/19/2010  . SCIATICA, LEFT 04/19/2010  . Chronic pain syndrome 10/27/2009  . Hyperlipidemia 07/15/2009  . Essential hypertension 06/24/2009  . CAD (coronary artery disease) 06/24/2009  . Allergic rhinitis 06/24/2009  . URETHRAL STRICTURE 06/24/2009  . DEGENERATIVE JOINT DISEASE 06/24/2009  . SHOULDER PAIN, BILATERAL 06/24/2009  . FATIGUE 06/24/2009  . NEPHROLITHIASIS,  HX OF 06/24/2009   Past Medical History:  Diagnosis Date  . ALLERGIC RHINITIS 06/24/2009  . Allergic rhinitis 06/24/2009   Qualifier: Diagnosis of  By: [name], [name]   . Anxiety 11/12/2011   Adequate for discharge   . Arthritis    "all my joints" (09/30/2013)  . Arthritis of foot, right, degenerative 04/15/2014  . Balance disorder 03/12/2013  . Benign neoplasm of cecum   . Benign neoplasm of descending colon   . CAD (coronary artery disease) 06/24/2009   5 stents placed in 2007    . Chronic anticoagulation   . Chronic pain syndrome 10/27/2009   of ankle, shoulders, low back.  sciatica.   . Closed fracture of right foot 10/17/2014  . CORONARY ARTERY DISEASE 06/24/2009   a. s/p multiple PCIs - In 2008 he had a Taxus DES to the mild LAD, Endeavor DES to mid LCX and distal LCX. In January 2009 he had DES to distal LCX, mid LCX and proximal LCX. In November 2009 had BMS x 2 to the mid RCA. Cath 10/2011 with patent stents, noncardiac CP. LHC 01/2013: patent stents (noncardiac CP).  . DEGENERATIVE JOINT DISEASE 06/24/2009   Qualifier: Diagnosis of  By: [name], [name]   . Depression   . Depression with anxiety    Prior suicide attempt  .  [place] DISEASE, LUMBAR 04/19/2010  . ERECTILE DYSFUNCTION, ORGANIC 05/30/2010  . Essential hypertension 06/24/2009   Qualifier: Diagnosis of  By: [name], [name]   . Fibromyalgia   . Fracture dislocation of ankle joint 09/02/2015  . Gait disorder 03/12/2013  . General weakness 07/14/2014  . GERD (gastroesophageal reflux disease) 09/08/2015  . Hand joint pain 06/10/2013  . Heart murmur   . Hepatitis C   . History of kidney stones   . Hyperlipidemia 07/15/2009   Qualifier: Diagnosis of  By: Aundra Dubin, MD, [place]    . HYPERLIPIDEMIA-MIXED 07/15/2009  . HYPERTENSION 06/24/2009  . Insomnia 10/04/2011  . Irregular heart beat   . Left hip pain 03/12/2013   Injected under ultrasound guidance on June 24, 2013   . Major depression 09/13/2015  . Myocardial infarction ([place]) 2008   . Non-cardiac chest pain 10/2011, 01/2013  . Obesity   . Occult blood positive stool 10/17/2014  . Open ankle fracture 09/02/2015  . OSA (obstructive sleep apnea)    not using CPAP (09/30/2013)  . Pain of right thumb 04/03/2013  . Pneumonia   . PPD positive 04/08/2015  . Pre-ulcerative corn or callous 02/06/2013  . Restless legs   . Rotator cuff tear arthropathy of both shoulders 06/10/2013   History of bilateral shoulder cuff surgery for rotator cuff tears. Reports increase in pain 09/11/2015 during physical therapy of the left shoulder.   [name] SCIATICA, LEFT 04/19/2010   Qualifier: Diagnosis of  By: [name], [name]   . Spinal stenosis in cervical region 09/26/2013  . Spinal stenosis, lumbar region, with neurogenic claudication 09/26/2013  . Type II diabetes mellitus ([place]) 2012   no meds in 09/2014.   [name] Uncontrolled type 2 DM with peripheral circulatory disorder ([place]) 10/04/2013  . URETHRAL STRICTURE 06/24/2009   self catheterizes.    Family History  Problem Relation Age of Onset  . Depression Mother   . Heart disease Mother   . Hypertension Mother   . Cancer Mother        Breast  . Diabetes Father   . Heart disease Father        CABG  . Cancer Father        prostate and skin cancer  . Hypertension Father   . Hyperlipidemia Father   . Depression Brother        x 2  . Hypertension Brother        x2  . Heart disease Maternal Grandfather   . Early death Maternal Grandfather 64       heart attack  . Early death Paternal Grandfather   . Coronary artery disease Other   . Hypertension Other   . Depression Other    Past Surgical History:  Procedure Laterality Date  . AMPUTATION Right 06/14/2016   Procedure: AMPUTATION BELOW KNEE;  Surgeon: Newt Minion, MD;  Location: [facility];  Service: Orthopedics;  Laterality: Right;  . ANKLE FUSION Right 04/15/2014   Procedure: Right Subtalar, Talonavicular Fusion;  Surgeon: Newt Minion, MD;  Location: [facility];  Service: Orthopedics;  Laterality:  Right;  . ANKLE FUSION Right 04/18/2016   Right Ankle Tibiocalcaneal Fusion; Removal of deep retained hardware; application of wound vac/notes 04/18/2016  . ANKLE FUSION Right 04/18/2016   Procedure: Right Ankle Tibiocalcaneal Fusion;  Surgeon: Newt Minion, MD;  Location: [facility];  Service: Orthopedics;  Laterality: Right;  . ANTERIOR CERVICAL DECOMP/DISCECTOMY FUSION N/A 09/26/2013   Procedure: ANTERIOR CERVICAL DISCECTOMY FUSION C3-4, plate  and screw fixation, allograft bone graft;  Surgeon: Jessy Oto, MD;  Location: [facility];  Service: Orthopedics;  Laterality: N/A;  . BACK SURGERY    . BELOW KNEE LEG AMPUTATION Right 06/14/2016  . CARDIAC CATHETERIZATION  X 1  . CARPAL TUNNEL RELEASE Bilateral   . COLONOSCOPY    . COLONOSCOPY N/A 10/22/2014   Procedure: COLONOSCOPY;  Surgeon: Lafayette Dragon, MD;  Location[facility] ENDOSCOPY;  Service: Endoscopy;  Laterality: N/A;  . CORONARY ANGIOPLASTY WITH STENT PLACEMENT     "I have 9 stents"  . ESOPHAGOGASTRODUODENOSCOPY N/A 10/19/2014   Procedure: ESOPHAGOGASTRODUODENOSCOPY (EGD);  Surgeon: Jerene Bears, MD;  Location[facility] ENDOSCOPY;  Service: Endoscopy;  Laterality: N/A;  . FUSION OF TALONAVICULAR JOINT Right 04/15/2014   dr duda  . HERNIA REPAIR     umbilical  . INGUINAL HERNIA REPAIR Right 05/11/2015   Procedure: LAPAROSCOPIC REPAIR RIGHT  INGUINAL HERNIA;  Surgeon: Greer Pickerel, MD;  Location: [facility];  Service: General;  Laterality: Right;  . INSERTION OF MESH Right 05/11/2015   Procedure: INSERTION OF MESH;  Surgeon: Greer Pickerel, MD;  Location: [facility];  Service: General;  Laterality: Right;  . JOINT REPLACEMENT Bilateral    knees  . KNEE CARTILAGE SURGERY Right X 12   "~ 1/2 open; ~ 1/2 scopes"  . KNEE CARTILAGE SURGERY Left X 3   "3 scopes"  . LEFT HEART CATHETERIZATION WITH CORONARY ANGIOGRAM N/A 02/10/2013   Procedure: LEFT HEART CATHETERIZATION WITH CORONARY ANGIOGRAM;  Surgeon: Burnell Blanks, MD;  Location: [facility] CATH LAB;  Service:  Cardiovascular;  Laterality: N/A;  . LUMBAR LAMINECTOMY/DECOMPRESSION MICRODISCECTOMY N/A 01/27/2014   Procedure: CENTRAL LUMBAR LAMINECTOMY L4-5 AND L3-4;  Surgeon: Jessy Oto, MD;  Location: [facility];  Service: Orthopedics;  Laterality: N/A;  . ORIF ANKLE FRACTURE Right 09/02/2015   Procedure: OPEN REDUCTION INTERNAL FIXATION (ORIF) ANKLE FRACTURE;  Surgeon: Newt Minion, MD;  Location: [facility];  Service: Orthopedics;  Laterality: Right;  . PERIPHERALLY INSERTED CENTRAL CATHETER INSERTION  09/02/2015  . SHOULDER ARTHROSCOPY W/ ROTATOR CUFF REPAIR Bilateral    "3 on the right; 1 on the left"  . SKIN SPLIT GRAFT Right 10/01/2015   Procedure: RIGHT ANKLE APPLY SKIN GRAFT SPLIT THICKNESS;  Surgeon: Newt Minion, MD;  Location: [facility];  Service: Orthopedics;  Laterality: Right;  . TONSILLECTOMY    . TOTAL KNEE ARTHROPLASTY Bilateral 2008  . UMBILICAL HERNIA REPAIR     UHR  . URETHRAL DILATION  X 4  . VASECTOMY     Social History   Occupational History  . disabled since 2006 due to ortho. heart, psych Unemployed  . part time work as an Multimedia programmer, wrestling, and baseball Agricultural consultant    Social History Main Topics  . Smoking status: Former Smoker    Types: Cigars    Quit date: 08/28/2010  . Smokeless tobacco: Never Used     Comment: 04/18/2016 "smoked 1 cigar/wk when I did smoke"  . Alcohol use No     Comment: rarely  . Drug use: No  . Sexual activity: Not Currently

## 2017-02-16 NOTE — Telephone Encounter (Signed)
Auto approved on [employer] website for codes [phone] and [phone]. Auth # [id number]. Pt already scheduled.

## 2017-02-26 ENCOUNTER — Other Ambulatory Visit (HOSPITAL_COMMUNITY): Payer: Self-pay | Admitting: Psychiatry

## 2017-02-26 DIAGNOSIS — F319 Bipolar disorder, unspecified: Secondary | ICD-10-CM

## 2017-03-01 ENCOUNTER — Encounter (INDEPENDENT_AMBULATORY_CARE_PROVIDER_SITE_OTHER): Payer: Self-pay | Admitting: Physical Medicine and Rehabilitation

## 2017-03-01 ENCOUNTER — Ambulatory Visit (INDEPENDENT_AMBULATORY_CARE_PROVIDER_SITE_OTHER): Payer: Medicare HMO | Admitting: Physical Medicine and Rehabilitation

## 2017-03-01 ENCOUNTER — Other Ambulatory Visit (INDEPENDENT_AMBULATORY_CARE_PROVIDER_SITE_OTHER): Payer: Medicare HMO

## 2017-03-01 ENCOUNTER — Ambulatory Visit (INDEPENDENT_AMBULATORY_CARE_PROVIDER_SITE_OTHER): Payer: Medicare HMO

## 2017-03-01 ENCOUNTER — Encounter (INDEPENDENT_AMBULATORY_CARE_PROVIDER_SITE_OTHER): Payer: Self-pay

## 2017-03-01 ENCOUNTER — Ambulatory Visit (INDEPENDENT_AMBULATORY_CARE_PROVIDER_SITE_OTHER): Payer: Medicare HMO | Admitting: Surgery

## 2017-03-01 VITALS — BP 107/47 | HR 86 | Temp 99.0°F

## 2017-03-01 DIAGNOSIS — R739 Hyperglycemia, unspecified: Secondary | ICD-10-CM

## 2017-03-01 DIAGNOSIS — M47816 Spondylosis without myelopathy or radiculopathy, lumbar region: Secondary | ICD-10-CM | POA: Diagnosis not present

## 2017-03-01 DIAGNOSIS — Z0001 Encounter for general adult medical examination with abnormal findings: Secondary | ICD-10-CM

## 2017-03-01 LAB — HEPATIC FUNCTION PANEL
ALT: 9 U/L (ref 0–53)
AST: 12 U/L (ref 0–37)
Albumin: 4.1 g/dL (ref 3.5–5.2)
Alkaline Phosphatase: 68 U/L (ref 39–117)
BILIRUBIN TOTAL: 0.5 mg/dL (ref 0.2–1.2)
Bilirubin, Direct: 0.1 mg/dL (ref 0.0–0.3)
TOTAL PROTEIN: 6.6 g/dL (ref 6.0–8.3)

## 2017-03-01 LAB — CBC WITH DIFFERENTIAL/PLATELET
Basophils Absolute: 0 K/uL (ref 0.0–0.1)
Basophils Relative: 0.9 % (ref 0.0–3.0)
Eosinophils Absolute: 0.2 K/uL (ref 0.0–0.7)
Eosinophils Relative: 5.4 % — ABNORMAL HIGH (ref 0.0–5.0)
HCT: 39.9 % (ref 39.0–52.0)
Hemoglobin: 13.2 g/dL (ref 13.0–17.0)
Lymphocytes Relative: 35.1 % (ref 12.0–46.0)
Lymphs Abs: 1.6 K/uL (ref 0.7–4.0)
MCHC: 33 g/dL (ref 30.0–36.0)
MCV: 88.9 fl (ref 78.0–100.0)
Monocytes Absolute: 0.4 K/uL (ref 0.1–1.0)
Monocytes Relative: 9.4 % (ref 3.0–12.0)
Neutro Abs: 2.2 K/uL (ref 1.4–7.7)
Neutrophils Relative %: 49.2 % (ref 43.0–77.0)
Platelets: 239 K/uL (ref 150.0–400.0)
RBC: 4.48 Mil/uL (ref 4.22–5.81)
RDW: 15.3 % (ref 11.5–15.5)
WBC: 4.4 K/uL (ref 4.0–10.5)

## 2017-03-01 LAB — LIPID PANEL
Cholesterol: 151 mg/dL (ref 0–200)
HDL: 45.2 mg/dL
LDL Cholesterol: 88 mg/dL (ref 0–99)
NonHDL: 105.65
Total CHOL/HDL Ratio: 3
Triglycerides: 89 mg/dL (ref 0.0–149.0)
VLDL: 17.8 mg/dL (ref 0.0–40.0)

## 2017-03-01 LAB — URINALYSIS, ROUTINE W REFLEX MICROSCOPIC
Bilirubin Urine: NEGATIVE
Hgb urine dipstick: NEGATIVE
Ketones, ur: NEGATIVE
Leukocytes, UA: NEGATIVE
Nitrite: POSITIVE — AB
RBC / HPF: NONE SEEN (ref 0–?)
Specific Gravity, Urine: 1.02 (ref 1.000–1.030)
Total Protein, Urine: NEGATIVE
Urine Glucose: NEGATIVE
Urobilinogen, UA: 0.2 (ref 0.0–1.0)
pH: 6 (ref 5.0–8.0)

## 2017-03-01 LAB — BASIC METABOLIC PANEL WITH GFR
BUN: 17 mg/dL (ref 6–23)
CO2: 25 meq/L (ref 19–32)
Calcium: 8.4 mg/dL (ref 8.4–10.5)
Chloride: 104 meq/L (ref 96–112)
Creatinine, Ser: 0.95 mg/dL (ref 0.40–1.50)
GFR: 84.63 mL/min
Glucose, Bld: 125 mg/dL — ABNORMAL HIGH (ref 70–99)
Potassium: 4.2 meq/L (ref 3.5–5.1)
Sodium: 137 meq/L (ref 135–145)

## 2017-03-01 LAB — TSH: TSH: 3 u[IU]/mL (ref 0.35–4.50)

## 2017-03-01 LAB — PSA: PSA: 0.94 ng/mL (ref 0.10–4.00)

## 2017-03-01 LAB — HEMOGLOBIN A1C: Hgb A1c MFr Bld: 5.7 % (ref 4.6–6.5)

## 2017-03-01 MED ORDER — BUPIVACAINE HCL 0.5 % IJ SOLN
3.0000 mL | Freq: Once | INTRAMUSCULAR | Status: AC
  Administered 2017-03-01: 3 mL

## 2017-03-01 MED ORDER — LIDOCAINE HCL (PF) 1 % IJ SOLN
2.0000 mL | Freq: Once | INTRAMUSCULAR | Status: AC
  Administered 2017-03-01: 2 mL

## 2017-03-01 NOTE — Progress Notes (Deleted)
Here for planned MBB. No changes since last office visit.

## 2017-03-01 NOTE — Patient Instructions (Signed)

## 2017-03-07 NOTE — Procedures (Signed)
Travis [name] is a 66 year old gentleman with prior left lower limb prosthesis with degenerative facet arthropathy of the lumbar spine and chronic left-sided low back pain. He responded to facet joint blocks more than 50% relief and did not respond to sacroiliac joint injection. He has no radicular complaints. MRI does not show any focal stenosis.  Complete diagnostic facet joint blocks once again the double block paradigm to see how he does. This will be documented with a pain diary.  Lumbar Diagnostic Facet Joint Nerve Block with Fluoroscopic Guidance   Patient: Travis Skilling Sr.      Date of Birth: 03-03-1951 MRN: [id number] PCP: [name], MD      Visit Date: 03/01/2017   Universal Protocol:    Date/Time: 10/17/188:16 AM  Consent Given By: the patient  Position: PRONE  Additional Comments: Vital signs were monitored before and after the procedure. Patient was prepped and draped in the usual sterile fashion. The correct patient, procedure, and site was verified.   Injection Procedure Details:  Procedure Site One Meds Administered:  Meds ordered this encounter  Medications  . lidocaine (PF) (XYLOCAINE) 1 % injection 2 mL  . bupivacaine (MARCAINE) 0.5 % (with pres) injection 3 mL     Laterality: Left  Location/Site:  L4-L5 L5-S1  Needle size: 22 G  Needle type: spinal needle  Needle Placement: Oblique pedical  Findings:  -Contrast Used: 1 mL iohexol 180 mg iodine/mL   -Comments: It was excellent flow of contrast along the articular pillars without intravascular flow.  Procedure Details: The fluoroscope beam is vertically oriented in AP and then obliqued 15 to 20 degrees to the ipsilateral side of the desired nerve to achieve the "Scotty dog" appearance.  The skin over the target area of the junction of the superior articulating process and the transverse process (sacral ala if blocking the L5 dorsal rami) was locally anesthetized with a 1 ml volume of 1%  Lidocaine without Epinephrine.  The spinal needle was inserted and advanced in a trajectory view down to the target.   After contact with periosteum and negative aspirate for blood and CSF, correct placement without intravascular or epidural spread was confirmed by injecting 0.5 ml. of Isovue-250.  A spot radiograph was obtained of this image.    Next, a 0.5 ml. volume of the injectate described above was injected. The needle was then redirected to the other facet joint nerves mentioned above if needed.  Prior to the procedure, the patient was given a Pain Diary which was completed for baseline measurements.  After the procedure, the patient rated their pain every 30 minutes and will continue rating at this frequency for a total of 5 hours.  The patient has been asked to complete the Diary and return to [place] by mail, fax or hand delivered as soon as possible.   Additional Comments:  The patient tolerated the procedure well Dressing: Band-Aid    Post-procedure details: Patient was observed during the procedure. Post-procedure instructions were reviewed.  Patient left the clinic in stable condition.

## 2017-03-08 ENCOUNTER — Telehealth: Payer: Self-pay | Admitting: Internal Medicine

## 2017-03-08 NOTE — Telephone Encounter (Signed)
[facility] - Client [place] Call Center  Patient Name: Travis [name]  DOB: 08-Mar-1951    Initial Comment Caller states c/o thrush, Rx Nystatin isn't helping.    Nurse Assessment  Nurse: Raphael Gibney, RN, Vanita Ingles Date/Time (Eastern Time): 03/08/2017 12:04:03 PM  Confirm and document reason for call. If symptomatic, describe symptoms. ---Caller states he has thrush. he saw Dr. Jenny Reichmann 2-3 weeks ago. he used nystatin and the bottle is now empty. Has a lesion on his tongue. Has a sore throat. No fever. has sore throat for 10 days.  Does the patient have any new or worsening symptoms? ---Yes  Will a triage be completed? ---Yes  Related visit to physician within the last 2 weeks? ---No  Does the PT have any chronic conditions? (i.e. diabetes, asthma, etc.) ---Yes  List chronic conditions. ---diabetes and HTN  Is this a behavioral health or substance abuse call? ---No     Guidelines    Guideline Title Affirmed Question Affirmed Notes  Sore Throat Diabetes mellitus or weak immune system (e.g., HIV positive, cancer chemo, splenectomy, organ transplant)    Final Disposition User   See Physician within 24 Hours Stringer, RN, Vanita Ingles    Comments  Appt made with Lesly Rubenstein, MD for tomorrow at 1:45pm at the [place] office.   Referrals  REFERRED TO PCP OFFICE   Caller Disagree/Comply Comply  Caller Understands Yes  PreDisposition Call Doctor

## 2017-03-09 ENCOUNTER — Telehealth (INDEPENDENT_AMBULATORY_CARE_PROVIDER_SITE_OTHER): Payer: Self-pay | Admitting: Physical Medicine and Rehabilitation

## 2017-03-09 ENCOUNTER — Ambulatory Visit: Payer: Self-pay | Admitting: Internal Medicine

## 2017-03-13 DIAGNOSIS — M62830 Muscle spasm of back: Secondary | ICD-10-CM | POA: Diagnosis not present

## 2017-03-13 DIAGNOSIS — Z0000 Encounter for general adult medical examination without abnormal findings: Secondary | ICD-10-CM | POA: Diagnosis not present

## 2017-03-13 DIAGNOSIS — G8929 Other chronic pain: Secondary | ICD-10-CM | POA: Diagnosis not present

## 2017-03-13 DIAGNOSIS — L989 Disorder of the skin and subcutaneous tissue, unspecified: Secondary | ICD-10-CM | POA: Diagnosis not present

## 2017-03-13 DIAGNOSIS — K029 Dental caries, unspecified: Secondary | ICD-10-CM | POA: Diagnosis not present

## 2017-03-13 DIAGNOSIS — E118 Type 2 diabetes mellitus with unspecified complications: Secondary | ICD-10-CM | POA: Diagnosis not present

## 2017-03-13 DIAGNOSIS — R69 Illness, unspecified: Secondary | ICD-10-CM | POA: Diagnosis not present

## 2017-03-13 DIAGNOSIS — R251 Tremor, unspecified: Secondary | ICD-10-CM | POA: Diagnosis not present

## 2017-03-13 DIAGNOSIS — I259 Chronic ischemic heart disease, unspecified: Secondary | ICD-10-CM | POA: Diagnosis not present

## 2017-03-13 NOTE — Telephone Encounter (Signed)
Closing encounter

## 2017-03-14 ENCOUNTER — Ambulatory Visit: Payer: Self-pay | Admitting: Family Medicine

## 2017-03-14 DIAGNOSIS — Z89511 Acquired absence of right leg below knee: Secondary | ICD-10-CM | POA: Diagnosis not present

## 2017-03-14 NOTE — Telephone Encounter (Signed)
Faxed info to [employer]

## 2017-03-15 ENCOUNTER — Ambulatory Visit (INDEPENDENT_AMBULATORY_CARE_PROVIDER_SITE_OTHER): Payer: Medicare HMO | Admitting: Family Medicine

## 2017-03-15 ENCOUNTER — Ambulatory Visit: Payer: Self-pay | Admitting: Family Medicine

## 2017-03-15 ENCOUNTER — Encounter: Payer: Self-pay | Admitting: Family Medicine

## 2017-03-15 VITALS — BP 132/68 | HR 68 | Temp 98.2°F | Ht 73.0 in | Wt 306.0 lb

## 2017-03-15 DIAGNOSIS — K1321 Leukoplakia of oral mucosa, including tongue: Secondary | ICD-10-CM

## 2017-03-15 NOTE — Progress Notes (Signed)
Travis Brull Sr. - 66 y.o. male MRN [id number]  Date of birth: 13-Dec-1950  SUBJECTIVE:  Including CC & ROS.  Chief Complaint  Patient presents with  . Travis [name]    was prescribed nystatin on 01/26/17-white spot on tongue and patient states sore throat. Patient states has not improved.    Travis [name] is a 66 y.o. male that is  presenting with white lesions on his tongue. He reports no pain with the lesion but it is still occurring with no change. He denies any history of alcohol use or tobacco use. He denies any fevers or chills. Has not had any recent illnesses. He is diabetic and his last A1c is 5.7. Has not had any trouble swallowing  Patient was seen on 9/7 for the same lesion. He was provided and a statin during this encounter and his symptoms had no improvement.   Review of Systems  Constitutional: Negative for fever.  HENT: Negative for trouble swallowing and voice change.   Respiratory: Negative for cough.     HISTORY: Past Medical, Surgical, Social, and Family History Reviewed & Updated per EMR.   Pertinent Historical Findings include:  Past Medical History:  Diagnosis Date  . ALLERGIC RHINITIS 06/24/2009  . Allergic rhinitis 06/24/2009   Qualifier: Diagnosis of  By: Travis [name], Travis [name]   . Anxiety 11/12/2011   Adequate for discharge   . Arthritis    "all my joints" (09/30/2013)  . Arthritis of foot, right, degenerative 04/15/2014  . Balance disorder 03/12/2013  . Benign neoplasm of cecum   . Benign neoplasm of descending colon   . CAD (coronary artery disease) 06/24/2009   5 stents placed in 2007    . Chronic anticoagulation   . Chronic pain syndrome 10/27/2009   of ankle, shoulders, low back.  sciatica.   . Closed fracture of right foot 10/17/2014  . CORONARY ARTERY DISEASE 06/24/2009   a. s/p multiple PCIs - In 2008 he had a Taxus DES to the mild LAD, Endeavor DES to mid LCX and distal LCX. In January 2009 he had DES to distal LCX, mid LCX and proximal LCX. In November 2009 had  BMS x 2 to the mid RCA. Cath 10/2011 with patent stents, noncardiac CP. LHC 01/2013: patent stents (noncardiac CP).  . DEGENERATIVE JOINT DISEASE 06/24/2009   Qualifier: Diagnosis of  By: Travis [name], Travis [name]   . Depression   . Depression with anxiety    Prior suicide attempt  . Bee DISEASE, LUMBAR 04/19/2010  . ERECTILE DYSFUNCTION, ORGANIC 05/30/2010  . Essential hypertension 06/24/2009   Qualifier: Diagnosis of  By: Travis [name], Travis [name]   . Fibromyalgia   . Fracture dislocation of ankle joint 09/02/2015  . Gait disorder 03/12/2013  . General weakness 07/14/2014  . GERD (gastroesophageal reflux disease) 09/08/2015  . Hand joint pain 06/10/2013  . Heart murmur   . Hepatitis C   . History of kidney stones   . Hyperlipidemia 07/15/2009   Qualifier: Diagnosis of  By: Travis [name], [place]    . HYPERLIPIDEMIA-MIXED 07/15/2009  . HYPERTENSION 06/24/2009  . Insomnia 10/04/2011  . Irregular heart beat   . Left hip pain 03/12/2013   Injected under ultrasound guidance on June 24, 2013   . Major depression 09/13/2015  . Myocardial infarction (Travis [name]) 2008  . Non-cardiac chest pain 10/2011, 01/2013  . Obesity   . Occult blood positive stool 10/17/2014  . Open ankle fracture 09/02/2015  . OSA (obstructive sleep apnea)    not  using CPAP (09/30/2013)  . Pain of right thumb 04/03/2013  . Pneumonia   . PPD positive 04/08/2015  . Pre-ulcerative corn or callous 02/06/2013  . Restless legs   . Rotator cuff tear arthropathy of both shoulders 06/10/2013   History of bilateral shoulder cuff surgery for rotator cuff tears. Reports increase in pain 09/11/2015 during physical therapy of the left shoulder.   Travis [name] SCIATICA, LEFT 04/19/2010   Qualifier: Diagnosis of  By: Travis [name], Travis [name]   . Spinal stenosis in cervical region 09/26/2013  . Spinal stenosis, lumbar region, with neurogenic claudication 09/26/2013  . Type II diabetes mellitus ([place]) 2012   no meds in 09/2014.   Travis [name] Uncontrolled type 2 DM with peripheral circulatory disorder  (Travis [name]) 10/04/2013  . URETHRAL STRICTURE 06/24/2009   self catheterizes.     Past Surgical History:  Procedure Laterality Date  . AMPUTATION Right 06/14/2016   Procedure: AMPUTATION BELOW KNEE;  Surgeon: Travis Minion, [name];  Location: [place];  Service: Orthopedics;  Laterality: Right;  . ANKLE FUSION Right 04/15/2014   Procedure: Right Subtalar, Talonavicular Fusion;  Surgeon: Travis Minion, [name];  Location: [place];  Service: Orthopedics;  Laterality: Right;  . ANKLE FUSION Right 04/18/2016   Right Ankle Tibiocalcaneal Fusion; Removal of deep retained hardware; application of wound vac/notes 04/18/2016  . ANKLE FUSION Right 04/18/2016   Procedure: Right Ankle Tibiocalcaneal Fusion;  Surgeon: Travis Minion, [name];  Location: [place];  Service: Orthopedics;  Laterality: Right;  . ANTERIOR CERVICAL DECOMP/DISCECTOMY FUSION N/A 09/26/2013   Procedure: ANTERIOR CERVICAL DISCECTOMY FUSION C3-4, plate and screw fixation, allograft bone graft;  Surgeon: Travis [name];  Location: [place];  Service: Orthopedics;  Laterality: N/A;  . BACK SURGERY    . BELOW KNEE LEG AMPUTATION Right 06/14/2016  . CARDIAC CATHETERIZATION  X 1  . CARPAL TUNNEL RELEASE Bilateral   . COLONOSCOPY    . COLONOSCOPY N/A 10/22/2014   Procedure: COLONOSCOPY;  Surgeon: Travis [name];  [facility] ENDOSCOPY;  Service: Endoscopy;  Laterality: N/A;  . CORONARY ANGIOPLASTY WITH STENT PLACEMENT     "I have 9 stents"  . ESOPHAGOGASTRODUODENOSCOPY N/A 10/19/2014   Procedure: ESOPHAGOGASTRODUODENOSCOPY (EGD);  Surgeon: Travis [name];  Location: [facility] ENDOSCOPY;  Service: Endoscopy;  Laterality: N/A;  . FUSION OF TALONAVICULAR JOINT Right 04/15/2014   Travis [name]  . HERNIA REPAIR     umbilical  . INGUINAL HERNIA REPAIR Right 05/11/2015   Procedure: LAPAROSCOPIC REPAIR RIGHT  INGUINAL HERNIA;  Surgeon: Travis [name];  Location: [place];  Service: General;  Laterality: Right;  . INSERTION OF MESH Right 05/11/2015   Procedure: INSERTION OF MESH;   Surgeon: Travis [name];  Location: [place];  Service: General;  Laterality: Right;  . JOINT REPLACEMENT Bilateral    knees  . KNEE CARTILAGE SURGERY Right X 12   "~ 1/2 open; ~ 1/2 scopes"  . KNEE CARTILAGE SURGERY Left X 3   "3 scopes"  . LEFT HEART CATHETERIZATION WITH CORONARY ANGIOGRAM N/A 02/10/2013   Procedure: LEFT HEART CATHETERIZATION WITH CORONARY ANGIOGRAM;  Surgeon: Burnell Blanks, [name];  Location: [facility] CATH LAB;  Service: Cardiovascular;  Laterality: N/A;  . LUMBAR LAMINECTOMY/DECOMPRESSION MICRODISCECTOMY N/A 01/27/2014   Procedure: CENTRAL LUMBAR LAMINECTOMY L4-5 AND L3-4;  Surgeon: Travis [name];  Location: [place];  Service: Orthopedics;  Laterality: N/A;  . ORIF ANKLE FRACTURE Right 09/02/2015   Procedure: OPEN REDUCTION INTERNAL FIXATION (ORIF) ANKLE FRACTURE;  Surgeon: Beverely Low  Fernanda Drum, [name];  Location: [place];  Service: Orthopedics;  Laterality: Right;  . PERIPHERALLY INSERTED CENTRAL CATHETER INSERTION  09/02/2015  . SHOULDER ARTHROSCOPY W/ ROTATOR CUFF REPAIR Bilateral    "3 on the right; 1 on the left"  . SKIN SPLIT GRAFT Right 10/01/2015   Procedure: RIGHT ANKLE APPLY SKIN GRAFT SPLIT THICKNESS;  Surgeon: Travis Minion, [name];  Location: [place];  Service: Orthopedics;  Laterality: Right;  . TONSILLECTOMY    . TOTAL KNEE ARTHROPLASTY Bilateral 2008  . UMBILICAL HERNIA REPAIR     UHR  . URETHRAL DILATION  X 4  . VASECTOMY      No Known Allergies  Family History  Problem Relation Age of Onset  . Depression Mother   . Heart disease Mother   . Hypertension Mother   . Cancer Mother        Breast  . Diabetes Father   . Heart disease Father        CABG  . Cancer Father        prostate and skin cancer  . Hypertension Father   . Hyperlipidemia Father   . Depression Brother        x 2  . Hypertension Brother        x2  . Heart disease Maternal Grandfather   . Early death Maternal Grandfather 87       heart attack  . Early death Paternal Grandfather   . Coronary  artery disease Other   . Hypertension Other   . Depression Other      Social History   Social History  . Marital status: Divorced    Spouse name: N/A  . Number of children: 2  . Years of education: N/A   Occupational History  . disabled since 2006 due to ortho. heart, psych Unemployed  . part time work as an Multimedia programmer, wrestling, and baseball Agricultural consultant    Social History Main Topics  . Smoking status: Former Smoker    Types: Cigars    Quit date: 08/28/2010  . Smokeless tobacco: Never Used     Comment: 04/18/2016 "smoked 1 cigar/wk when I did smoke"  . Alcohol use No     Comment: rarely  . Drug use: No  . Sexual activity: Not Currently   Other Topics Concern  . Not on file   Social History Narrative   Played semi-pro football, used steroids   Divorced moved here from [place], [place]   Patient states he has been on disability since his knee surgery.      03/05/2013 AHW "[name]" was born and grew up in [place], [place], and moved to [place], [place] at age 44. He has one sister and 3 brothers. He reports that his childhood was rough. His parents are deceased. He graduated from [employer], and attended one year of college. He is currently unemployed and on disability for multiple medical problems. He has been married 5 times. He has 2 sons. He currently lives alone. 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 He denies that he has any social support system. 03/05/2013 AHW              PHYSICAL EXAM:  VS: BP 132/68 (BP Location: Left Arm, Patient Position: Sitting, Cuff Size: Normal)   Pulse 68   Temp 98.2 F (36.8 C) (Oral)   Ht 6' 1"  (1.854 m)   Wt (!) 306 lb (138.8 kg)   SpO2 99%  BMI 40.37 kg/m  Physical Exam Gen: NAD, alert, cooperative with exam, well-appearing ENT: normal lips, normal nasal mucosa, White patch occurring on the posterior aspect of his tongue that is resistant to removal Eye:  normal EOM, normal conjunctiva and lids CV:  no edema, +2 pedal pulses   Resp: no accessory muscle use, non-labored,  Skin: no rashes, no areas of induration  Neuro: normal tone, normal sensation to touch Psych:  normal insight, alert and oriented MSK: Abnormal gait due to BKA on the right.      ASSESSMENT & PLAN:   Leukoplakia, tongue Symptoms seem consistent with leukoplakia. No improvement with nystatin - Referral to ENT for consideration of biopsies.

## 2017-03-15 NOTE — Patient Instructions (Addendum)
Thank you for coming in,   I am sending you to the ear, nose and throat doctor. They may need to cut this area out.    Please feel free to call with any questions or concerns at any time, at [phone]. --Dr. Raeford Razor   Leukoplakia Leukoplakia refers to white patches that develop in your mouth. These patches may show up on the insides of your cheeks, on or under your tongue, or on your gums. Leukoplakia can also develop on the outer area of the male genitalia (vulva ). In rare cases, it can occur in the area around the anus. Leukoplakia usually goes away with treatment. In some cases, leukoplakia can indicate an increased risk of cancer. What are the causes? Many conditions can cause or increase the risk of leukoplakia in the mouth. These may include:  Any type of tobacco use, especially when combined with the use of alcohol.  Irritation of the mouth from rough teeth or dentures.  Having a weakened defense system (immune system), such as occurs with HIV or AIDS or after a bone marrow transplant.  Many conditions can also cause or increase the risk of leukoplakia of the vulva, including:  Long-lasting infections of the vulva.  Some skin diseases.  Long-lasting irritation of the vulva.  Long-term use of steroid creams or ointments.  Having a weakened immune system, such as occurs with HIV or AIDS or after a bone marrow transplant.  What are the signs or symptoms? The main symptom is the development of patches or flat areas in the mouth or on the vulva. These patches may be:  Odd shaped.  Hard.  Raised.  White or gray in color. Some areas may be reddened.  Unable to be wiped away or scraped away.  Fuzzy.  Sensitive to touch, heat, or foods that are spicy or acidic.  How is this diagnosed? Leukoplakia has a distinct appearance, so your health care provider can usually make a diagnosis by closely examining the affected area. A sample of the white patch may be removed  (biopsy) and examined under a microscope. This helps to confirm the diagnosis and make sure that it is not a form of cancer. How is this treated? Treatment for leukoplakia may include:  Stopping tobacco use.  Repairing any rough teeth or dentures.  Surgical removal of the patch. This may be done using a surgical knife (scalpel), laser, heat, or cold.  Medicines that can be taken by mouth.  Medicines that can be applied to the patches.  Follow these instructions at home:  Do not use any tobacco products, including cigarettes, chewing tobacco, or electronic cigarettes. If you need help quitting, ask your health care provider.  Check with your dentist to see if you need any repairs to teeth or dentures.  Take medicines only as directed by your health care provider.  Avoid foods or beverages that seem to irritate the leukoplakia. Contact a health care provider if:  You develop new patches of leukoplakia.  You notice changes in the size, shape, or feeling of existing patches.  You have a fever. Get help right away if:  You have severe pain in the area of a patch, and the pain is not helped by prescribed medicine.  You have bleeding in the area of a patch, and you cannot stop the bleeding.  You have a patch in your mouth that becomes so swollen that you have trouble eating or breathing.  You have a patch at the vulva that becomes  so swollen that you have trouble passing urine. This information is not intended to replace advice given to you by your health care provider. Make sure you discuss any questions you have with your health care provider. Document Released: 04/20/2008 Document Revised: 10/14/2015 Document Reviewed: 12/16/2013 Elsevier Interactive Patient Education  [employer].

## 2017-03-16 NOTE — Assessment & Plan Note (Signed)
Symptoms seem consistent with leukoplakia. No improvement with nystatin - Referral to ENT for consideration of biopsies.

## 2017-03-16 NOTE — Telephone Encounter (Signed)
Scheduled for 11/7 at 1530.

## 2017-03-26 ENCOUNTER — Other Ambulatory Visit (HOSPITAL_COMMUNITY): Payer: Self-pay | Admitting: Psychiatry

## 2017-03-26 DIAGNOSIS — F319 Bipolar disorder, unspecified: Secondary | ICD-10-CM

## 2017-03-28 ENCOUNTER — Ambulatory Visit (HOSPITAL_COMMUNITY): Payer: Self-pay | Admitting: Psychiatry

## 2017-03-28 ENCOUNTER — Encounter (INDEPENDENT_AMBULATORY_CARE_PROVIDER_SITE_OTHER): Payer: Medicare HMO | Admitting: Physical Medicine and Rehabilitation

## 2017-03-28 ENCOUNTER — Other Ambulatory Visit: Payer: Self-pay

## 2017-03-28 ENCOUNTER — Other Ambulatory Visit (HOSPITAL_COMMUNITY): Payer: Self-pay

## 2017-03-28 DIAGNOSIS — F319 Bipolar disorder, unspecified: Secondary | ICD-10-CM

## 2017-03-28 MED ORDER — BENZTROPINE MESYLATE 0.5 MG PO TABS: 0.5000 mg | ORAL_TABLET | Freq: Every day | ORAL | 0 refills | Status: DC

## 2017-03-28 MED ORDER — FLUOXETINE HCL 40 MG PO CAPS: ORAL_CAPSULE | ORAL | 0 refills | Status: DC

## 2017-03-28 MED ORDER — TRAZODONE HCL 150 MG PO TABS: 150.0000 mg | ORAL_TABLET | Freq: Every day | ORAL | 0 refills | Status: DC

## 2017-03-28 MED ORDER — ATORVASTATIN CALCIUM 20 MG PO TABS: 20.0000 mg | ORAL_TABLET | Freq: Every day | ORAL | 2 refills | Status: DC

## 2017-03-28 MED ORDER — ARIPIPRAZOLE 5 MG PO TABS: 5.0000 mg | ORAL_TABLET | Freq: Every day | ORAL | 0 refills | Status: DC

## 2017-04-06 DIAGNOSIS — J310 Chronic rhinitis: Secondary | ICD-10-CM | POA: Diagnosis not present

## 2017-04-06 DIAGNOSIS — B370 Candidal stomatitis: Secondary | ICD-10-CM | POA: Diagnosis not present

## 2017-04-06 DIAGNOSIS — T485X1A Poisoning by other anti-common-cold drugs, accidental (unintentional), initial encounter: Secondary | ICD-10-CM | POA: Diagnosis not present

## 2017-04-06 DIAGNOSIS — R1310 Dysphagia, unspecified: Secondary | ICD-10-CM | POA: Diagnosis not present

## 2017-04-13 ENCOUNTER — Other Ambulatory Visit: Payer: Self-pay | Admitting: Internal Medicine

## 2017-04-16 NOTE — Telephone Encounter (Signed)
Done erx 

## 2017-04-17 ENCOUNTER — Other Ambulatory Visit: Payer: Self-pay | Admitting: Internal Medicine

## 2017-04-17 ENCOUNTER — Ambulatory Visit (INDEPENDENT_AMBULATORY_CARE_PROVIDER_SITE_OTHER): Payer: Self-pay

## 2017-04-17 ENCOUNTER — Encounter (INDEPENDENT_AMBULATORY_CARE_PROVIDER_SITE_OTHER): Payer: Self-pay | Admitting: Physical Medicine and Rehabilitation

## 2017-04-17 ENCOUNTER — Ambulatory Visit (INDEPENDENT_AMBULATORY_CARE_PROVIDER_SITE_OTHER): Payer: Medicare HMO | Admitting: Physical Medicine and Rehabilitation

## 2017-04-17 VITALS — BP 171/82 | HR 63 | Temp 98.4°F

## 2017-04-17 DIAGNOSIS — M47816 Spondylosis without myelopathy or radiculopathy, lumbar region: Secondary | ICD-10-CM | POA: Diagnosis not present

## 2017-04-17 MED ORDER — LIDOCAINE HCL (PF) 1 % IJ SOLN
2.0000 mL | Freq: Once | INTRAMUSCULAR | Status: AC
  Administered 2017-04-17: 2 mL

## 2017-04-17 MED ORDER — METHYLPREDNISOLONE ACETATE 80 MG/ML IJ SUSP
80.0000 mg | Freq: Once | INTRAMUSCULAR | Status: AC
  Administered 2017-04-17: 80 mg

## 2017-04-17 NOTE — Patient Instructions (Signed)

## 2017-04-17 NOTE — Progress Notes (Deleted)
Pt here for Left RFA, pt is having pain states has started back yesterday. No numbness or tingling. Radiates from center of back to left hip. Has driver, on blood thinners, no allery to contrast dye.

## 2017-04-18 NOTE — Procedures (Signed)
[name] is a 66 year old gentleman with complicated history of chronic back and pelvic pain.  He has had a history of lumbar surgery without fusion.  He has a right below the knee rotation.  We have undergone double diagnostic medial branch blocks on the left side of the lower facet joints and he is done quite well with that and as documented.  He has had physical therapy without relief and medication management including opioids without much relief general.  His case is complicated with significant diabetes.  He is also had extended bout of thrush that is been treated for.  Did discuss that steroid medication can be a potential source of that along with his diabetes.  Ablation procedure we will use a little bit of cortisone after each ablation.  Please see our prior evaluation and management note for further details and justification.  Lumbar Facet Joint Nerve Denervation  Patient: Travis Weinberg Sr.      Date of Birth: [birth year] MRN: [id number] PCP: [name], MD      Visit Date: 04/17/2017   Universal Protocol:    Date/Time: 11/28/185:50 AM  Consent Given By: the patient  Position: PRONE  Additional Comments: Vital signs were monitored before and after the procedure. Patient was prepped and draped in the usual sterile fashion. The correct patient, procedure, and site was verified.   Injection Procedure Details:  Procedure Site One Meds Administered:  Meds ordered this encounter  Medications  . lidocaine (PF) (XYLOCAINE) 1 % injection 2 mL  . methylPREDNISolone acetate (DEPO-MEDROL) injection 80 mg     Laterality: Left  Location/Site: Left L3 and L4 medial branches and L5 dorsal ramus L4-L5 L5-S1  Needle size: 18 G  Needle type: Radiofrequency cannula  Needle Placement: Along juncture of superior articular process and transverse pocess  Findings:  -Comments:  Procedure Details: For each desired target nerve, the corresponding transverse process (sacral ala  for the L5 dorsal rami) was identified and the fluoroscope was positioned to square off the endplates of the corresponding vertebral body to achieve a true AP midline view.  The beam was then obliqued 15 to 20 degrees and caudally tilted 15 to 20 degrees to line up a trajectory along the target nerves. The skin over the target of the junction of superior articulating process and transverse process (sacral ala for the L5 dorsal rami) was infiltrated with 90m of 1% Lidocaine without Epinephrine.  The 18 gauge 162mactive tip outer cannula was advanced in trajectory view to the target.  This procedure was repeated for each target nerve.  Then, for all levels, the outer cannula placement was fine-tuned and the position was then confirmed with bi-planar imaging.    Test stimulation was done both at sensory and motor levels to ensure there was no radicular stimulation. The target tissues were then infiltrated with 1 ml of 1% Lidocaine without Epinephrine. Subsequently, a percutaneous neurotomy was carried out for 60 seconds at 80 degrees Celsius. The procedure was repeated with the cannula rotated 90 degrees, for duration of 60 seconds, one additional time at each level for a total of two lesions per level.  After the completion of the two lesions, 1 ml of injectate was delivered. It was then repeated for each facet joint nerve mentioned above. Appropriate radiographs were obtained to verify the probe placement during the neurotomy.   Additional Comments:  The patient tolerated the procedure well Dressing: Band-Aid    Post-procedure details: Patient was observed during the procedure. Post-procedure  instructions were reviewed.  Patient left the clinic in stable condition.

## 2017-04-23 ENCOUNTER — Ambulatory Visit (HOSPITAL_COMMUNITY): Payer: Self-pay | Admitting: Psychiatry

## 2017-04-25 ENCOUNTER — Other Ambulatory Visit (HOSPITAL_COMMUNITY): Payer: Self-pay | Admitting: Psychiatry

## 2017-04-25 DIAGNOSIS — F319 Bipolar disorder, unspecified: Secondary | ICD-10-CM

## 2017-04-26 ENCOUNTER — Other Ambulatory Visit (HOSPITAL_COMMUNITY): Payer: Self-pay

## 2017-04-26 DIAGNOSIS — F319 Bipolar disorder, unspecified: Secondary | ICD-10-CM

## 2017-04-26 MED ORDER — TRAZODONE HCL 150 MG PO TABS: 150.0000 mg | ORAL_TABLET | Freq: Every day | ORAL | 0 refills | Status: DC

## 2017-05-03 ENCOUNTER — Ambulatory Visit (HOSPITAL_COMMUNITY): Payer: Self-pay | Admitting: Psychiatry

## 2017-05-23 ENCOUNTER — Other Ambulatory Visit (HOSPITAL_COMMUNITY): Payer: Self-pay | Admitting: Psychiatry

## 2017-05-23 DIAGNOSIS — F319 Bipolar disorder, unspecified: Secondary | ICD-10-CM

## 2017-05-28 ENCOUNTER — Other Ambulatory Visit (HOSPITAL_COMMUNITY): Payer: Self-pay

## 2017-05-28 DIAGNOSIS — K148 Other diseases of tongue: Secondary | ICD-10-CM | POA: Diagnosis not present

## 2017-05-28 DIAGNOSIS — F319 Bipolar disorder, unspecified: Secondary | ICD-10-CM

## 2017-05-28 MED ORDER — TRAZODONE HCL 150 MG PO TABS: 150.0000 mg | ORAL_TABLET | Freq: Every day | ORAL | 0 refills | Status: DC

## 2017-05-31 ENCOUNTER — Other Ambulatory Visit (HOSPITAL_COMMUNITY): Payer: Self-pay | Admitting: Psychiatry

## 2017-05-31 DIAGNOSIS — F319 Bipolar disorder, unspecified: Secondary | ICD-10-CM

## 2017-05-31 MED ORDER — BENZTROPINE MESYLATE 0.5 MG PO TABS: 0.5000 mg | ORAL_TABLET | Freq: Every day | ORAL | 0 refills | Status: DC

## 2017-06-04 ENCOUNTER — Ambulatory Visit (HOSPITAL_COMMUNITY): Payer: Self-pay | Admitting: Psychiatry

## 2017-06-11 ENCOUNTER — Other Ambulatory Visit: Payer: Self-pay | Admitting: Internal Medicine

## 2017-06-11 ENCOUNTER — Other Ambulatory Visit (HOSPITAL_COMMUNITY): Payer: Self-pay | Admitting: Psychiatry

## 2017-06-11 DIAGNOSIS — F319 Bipolar disorder, unspecified: Secondary | ICD-10-CM

## 2017-06-21 ENCOUNTER — Ambulatory Visit (INDEPENDENT_AMBULATORY_CARE_PROVIDER_SITE_OTHER): Payer: Medicare HMO | Admitting: Psychiatry

## 2017-06-21 ENCOUNTER — Encounter (HOSPITAL_COMMUNITY): Payer: Self-pay | Admitting: Psychiatry

## 2017-06-21 DIAGNOSIS — M549 Dorsalgia, unspecified: Secondary | ICD-10-CM

## 2017-06-21 DIAGNOSIS — Z818 Family history of other mental and behavioral disorders: Secondary | ICD-10-CM | POA: Diagnosis not present

## 2017-06-21 DIAGNOSIS — F419 Anxiety disorder, unspecified: Secondary | ICD-10-CM | POA: Diagnosis not present

## 2017-06-21 DIAGNOSIS — Z899 Acquired absence of limb, unspecified: Secondary | ICD-10-CM

## 2017-06-21 DIAGNOSIS — M2550 Pain in unspecified joint: Secondary | ICD-10-CM

## 2017-06-21 DIAGNOSIS — F319 Bipolar disorder, unspecified: Secondary | ICD-10-CM | POA: Diagnosis not present

## 2017-06-21 DIAGNOSIS — Z87891 Personal history of nicotine dependence: Secondary | ICD-10-CM | POA: Diagnosis not present

## 2017-06-21 DIAGNOSIS — Z915 Personal history of self-harm: Secondary | ICD-10-CM | POA: Diagnosis not present

## 2017-06-21 DIAGNOSIS — R69 Illness, unspecified: Secondary | ICD-10-CM | POA: Diagnosis not present

## 2017-06-21 DIAGNOSIS — Z560 Unemployment, unspecified: Secondary | ICD-10-CM | POA: Diagnosis not present

## 2017-06-21 MED ORDER — FLUOXETINE HCL 40 MG PO CAPS: ORAL_CAPSULE | ORAL | 0 refills | Status: DC

## 2017-06-21 MED ORDER — ARIPIPRAZOLE 5 MG PO TABS: 5.0000 mg | ORAL_TABLET | Freq: Every day | ORAL | 0 refills | Status: DC

## 2017-06-21 MED ORDER — TRAZODONE HCL 150 MG PO TABS: 150.0000 mg | ORAL_TABLET | Freq: Every day | ORAL | 0 refills | Status: DC

## 2017-06-21 MED ORDER — BENZTROPINE MESYLATE 0.5 MG PO TABS: 0.5000 mg | ORAL_TABLET | Freq: Every day | ORAL | 0 refills | Status: DC

## 2017-06-21 NOTE — Progress Notes (Signed)
[facility] MD/PA/NP OP Progress Note  06/21/2017 10:39 AM Travis Fortis Sr.  MRN:  [id number]  Chief Complaint: I am not so-so.  HPI: [name] came for his follow-up appointment.  He is compliant with his psychiatric medication however he is no longer taking gabapentin.  He cannot afford co-pay for his pain specialist and he has been out of gabapentin and pain medication.  He admitted increased pain in past few months.  He has difficulty walking and some nights he have poor sleep because of the pain.  He realized he need to go back to see pain specialist because some days he cannot handle the pain.  He admitted irritability but denies any mania, psychosis, hallucination or any feeling of hopelessness or worthlessness.  He denies any suicidal thoughts or homicidal thought.  He is adjusting well with his amputation and now he is able to walk with a stick.  He admitted gain weight because of lack of mobilization.  He started watching his calorie intake.  His appetite is okay.  His energy level is fair.  He has no tremors, shakes or EPS.  He denies drinking alcohol or using any illegal substances.  He lives with his niece who is very supportive.  He wants to continue Abilify, Cogentin, Prozac trazodone.  Visit Diagnosis:    ICD-10-CM   1. Bipolar 1 disorder (HCC) F31.9 traZODone (DESYREL) 150 MG tablet    FLUoxetine (PROZAC) 40 MG capsule    benztropine (COGENTIN) 0.5 MG tablet    ARIPiprazole (ABILIFY) 5 MG tablet    Past Psychiatric History: Reviewed. Patient has history of bipolar disorder and he seeing psychiatrists since age 35. He saw psychiatrist in [place] and [place]. He has history of severe mood swings, anger issues, hallucination, irritability and poor impulse control and excessive spending. He married 5 times and he has 2 children but patient has no contact with them.He has one psychiatric hospitalization in 2014 when he took overdose on Xanax with alcohol. He had at least history of 2 suicidal  gesture in the past. Patient also attended intensive outpatient program in the past. He had tried Effexor, Xanax, Klonopin, Ambien , Lunesta, Wellbutrin, Ambien and Remeron. He was started on Abilify from this office.   Past Medical History:  Past Medical History:  Diagnosis Date  . ALLERGIC RHINITIS 06/24/2009  . Allergic rhinitis 06/24/2009   Qualifier: Diagnosis of  By: [name], [name]   . Anxiety 11/12/2011   Adequate for discharge   . Arthritis    "all my joints" (09/30/2013)  . Arthritis of foot, right, degenerative 04/15/2014  . Balance disorder 03/12/2013  . Benign neoplasm of cecum   . Benign neoplasm of descending colon   . CAD (coronary artery disease) 06/24/2009   5 stents placed in 2007    . Chronic anticoagulation   . Chronic pain syndrome 10/27/2009   of ankle, shoulders, low back.  sciatica.   . Closed fracture of right foot 10/17/2014  . CORONARY ARTERY DISEASE 06/24/2009   a. s/p multiple PCIs - In 2008 he had a Taxus DES to the mild LAD, Endeavor DES to mid LCX and distal LCX. In January 2009 he had DES to distal LCX, mid LCX and proximal LCX. In November 2009 had BMS x 2 to the mid RCA. Cath 10/2011 with patent stents, noncardiac CP. LHC 01/2013: patent stents (noncardiac CP).  . DEGENERATIVE JOINT DISEASE 06/24/2009   Qualifier: Diagnosis of  By: [name], [name]   . Depression   .  Depression with anxiety    Prior suicide attempt  . [place] DISEASE, LUMBAR 04/19/2010  . ERECTILE DYSFUNCTION, ORGANIC 05/30/2010  . Essential hypertension 06/24/2009   Qualifier: Diagnosis of  By: [name], [name]   . Fibromyalgia   . Fracture dislocation of ankle joint 09/02/2015  . Gait disorder 03/12/2013  . General weakness 07/14/2014  . GERD (gastroesophageal reflux disease) 09/08/2015  . Hand joint pain 06/10/2013  . Heart murmur   . Hepatitis C   . History of kidney stones   . Hyperlipidemia 07/15/2009   Qualifier: Diagnosis of  By: Aundra Dubin, MD, [place]    . HYPERLIPIDEMIA-MIXED 07/15/2009   . HYPERTENSION 06/24/2009  . Insomnia 10/04/2011  . Irregular heart beat   . Left hip pain 03/12/2013   Injected under ultrasound guidance on June 24, 2013   . Major depression 09/13/2015  . Myocardial infarction ([place]) 2008  . Non-cardiac chest pain 10/2011, 01/2013  . Obesity   . Occult blood positive stool 10/17/2014  . Open ankle fracture 09/02/2015  . OSA (obstructive sleep apnea)    not using CPAP (09/30/2013)  . Pain of right thumb 04/03/2013  . Pneumonia   . PPD positive 04/08/2015  . Pre-ulcerative corn or callous 02/06/2013  . Restless legs   . Rotator cuff tear arthropathy of both shoulders 06/10/2013   History of bilateral shoulder cuff surgery for rotator cuff tears. Reports increase in pain 09/11/2015 during physical therapy of the left shoulder.   [name] SCIATICA, LEFT 04/19/2010   Qualifier: Diagnosis of  By: [name], [name]   . Spinal stenosis in cervical region 09/26/2013  . Spinal stenosis, lumbar region, with neurogenic claudication 09/26/2013  . Type II diabetes mellitus ([place]) 2012   no meds in 09/2014.   [name] Uncontrolled type 2 DM with peripheral circulatory disorder ([place]) 10/04/2013  . URETHRAL STRICTURE 06/24/2009   self catheterizes.     Past Surgical History:  Procedure Laterality Date  . AMPUTATION Right 06/14/2016   Procedure: AMPUTATION BELOW KNEE;  Surgeon: Newt Minion, MD;  Location: [facility];  Service: Orthopedics;  Laterality: Right;  . ANKLE FUSION Right 04/15/2014   Procedure: Right Subtalar, Talonavicular Fusion;  Surgeon: Newt Minion, MD;  Location: [facility];  Service: Orthopedics;  Laterality: Right;  . ANKLE FUSION Right 04/18/2016   Right Ankle Tibiocalcaneal Fusion; Removal of deep retained hardware; application of wound vac/notes 04/18/2016  . ANKLE FUSION Right 04/18/2016   Procedure: Right Ankle Tibiocalcaneal Fusion;  Surgeon: Newt Minion, MD;  Location: [facility];  Service: Orthopedics;  Laterality: Right;  . ANTERIOR CERVICAL DECOMP/DISCECTOMY FUSION N/A  09/26/2013   Procedure: ANTERIOR CERVICAL DISCECTOMY FUSION C3-4, plate and screw fixation, allograft bone graft;  Surgeon: Jessy Oto, MD;  Location: [facility];  Service: Orthopedics;  Laterality: N/A;  . BACK SURGERY    . BELOW KNEE LEG AMPUTATION Right 06/14/2016  . CARDIAC CATHETERIZATION  X 1  . CARPAL TUNNEL RELEASE Bilateral   . COLONOSCOPY    . COLONOSCOPY N/A 10/22/2014   Procedure: COLONOSCOPY;  Surgeon: Lafayette Dragon, MD;  Location: [facility] ENDOSCOPY;  Service: Endoscopy;  Laterality: N/A;  . CORONARY ANGIOPLASTY WITH STENT PLACEMENT     "I have 9 stents"  . ESOPHAGOGASTRODUODENOSCOPY N/A 10/19/2014   Procedure: ESOPHAGOGASTRODUODENOSCOPY (EGD);  Surgeon: Jerene Bears, MD;  Location: [facility] ENDOSCOPY;  Service: Endoscopy;  Laterality: N/A;  . FUSION OF TALONAVICULAR JOINT Right 04/15/2014   dr duda  . HERNIA REPAIR  umbilical  . INGUINAL HERNIA REPAIR Right 05/11/2015   Procedure: LAPAROSCOPIC REPAIR RIGHT  INGUINAL HERNIA;  Surgeon: Greer Pickerel, MD;  Location: [facility];  Service: General;  Laterality: Right;  . INSERTION OF MESH Right 05/11/2015   Procedure: INSERTION OF MESH;  Surgeon: Greer Pickerel, MD;  Location: [facility];  Service: General;  Laterality: Right;  . JOINT REPLACEMENT Bilateral    knees  . KNEE CARTILAGE SURGERY Right X 12   "~ 1/2 open; ~ 1/2 scopes"  . KNEE CARTILAGE SURGERY Left X 3   "3 scopes"  . LEFT HEART CATHETERIZATION WITH CORONARY ANGIOGRAM N/A 02/10/2013   Procedure: LEFT HEART CATHETERIZATION WITH CORONARY ANGIOGRAM;  Surgeon: Burnell Blanks, MD;  Location: [facility] CATH LAB;  Service: Cardiovascular;  Laterality: N/A;  . LUMBAR LAMINECTOMY/DECOMPRESSION MICRODISCECTOMY N/A 01/27/2014   Procedure: CENTRAL LUMBAR LAMINECTOMY L4-5 AND L3-4;  Surgeon: Jessy Oto, MD;  Location: [facility];  Service: Orthopedics;  Laterality: N/A;  . ORIF ANKLE FRACTURE Right 09/02/2015   Procedure: OPEN REDUCTION INTERNAL FIXATION (ORIF) ANKLE FRACTURE;  Surgeon: Newt Minion, MD;   Location: [facility];  Service: Orthopedics;  Laterality: Right;  . PERIPHERALLY INSERTED CENTRAL CATHETER INSERTION  09/02/2015  . SHOULDER ARTHROSCOPY W/ ROTATOR CUFF REPAIR Bilateral    "3 on the right; 1 on the left"  . SKIN SPLIT GRAFT Right 10/01/2015   Procedure: RIGHT ANKLE APPLY SKIN GRAFT SPLIT THICKNESS;  Surgeon: Newt Minion, MD;  Location: [facility];  Service: Orthopedics;  Laterality: Right;  . TONSILLECTOMY    . TOTAL KNEE ARTHROPLASTY Bilateral 2008  . UMBILICAL HERNIA REPAIR     UHR  . URETHRAL DILATION  X 4  . VASECTOMY      Family Psychiatric History: Viewed.  Family History:  Family History  Problem Relation Age of Onset  . Depression Mother   . Heart disease Mother   . Hypertension Mother   . Cancer Mother        Breast  . Diabetes Father   . Heart disease Father        CABG  . Cancer Father        prostate and skin cancer  . Hypertension Father   . Hyperlipidemia Father   . Depression Brother        x 2  . Hypertension Brother        x2  . Heart disease Maternal Grandfather   . Early death Maternal Grandfather 58       heart attack  . Early death Paternal Grandfather   . Coronary artery disease Other   . Hypertension Other   . Depression Other     Social History:  Social History   Socioeconomic History  . Marital status: Divorced    Spouse name: None  . Number of children: 2  . Years of education: None  . Highest education level: None  Social Needs  . Financial resource strain: None  . Food insecurity - worry: None  . Food insecurity - inability: None  . Transportation needs - medical: None  . Transportation needs - non-medical: None  Occupational History  . Occupation: disabled since 2006 due to ortho. heart, psych    Employer: UNEMPLOYED  . Occupation: part time work as an Multimedia programmer, wrestling, and Holiday representative  Tobacco Use  . Smoking status: Former Smoker    Types: Cigars    Last attempt to quit: 08/28/2010     Years since quitting: 6.8  .  Smokeless tobacco: Never Used  . Tobacco comment: 04/18/2016 "smoked 1 cigar/wk when I did smoke"  Substance and Sexual Activity  . Alcohol use: No    Alcohol/week: 0.0 oz    Comment: rarely  . Drug use: No  . Sexual activity: Not Currently  Other Topics Concern  . None  Social History Narrative   Played semi-pro football, used steroids   Divorced moved here from [place], [place]   Patient states he has been on disability since his knee surgery.      03/05/2013 AHW "[name]" was born and grew up in [place], [place], and moved to [place], [place] at age 20. He has one sister and 3 brothers. He reports that his childhood was rough. His parents are deceased. He graduated from [employer], and attended one year of college. He is currently unemployed and on disability for multiple medical problems. He has been married 5 times. He has 2 sons. He currently lives alone. 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 He denies that he has any social support system. 03/05/2013 AHW          Allergies: No Known Allergies  No results found for this or any previous visit (from the past 2160 hour(s)). Metabolic Disorder Labs: Lab Results  Component Value Date   HGBA1C 5.7 03/01/2017   MPG 105 04/18/2016   MPG 114 09/26/2013   No results found for: PROLACTIN Lab Results  Component Value Date   CHOL 151 03/01/2017   TRIG 89.0 03/01/2017   HDL 45.20 03/01/2017   CHOLHDL 3 03/01/2017   VLDL 17.8 03/01/2017   LDLCALC 88 03/01/2017   LDLCALC 51 11/10/2015   Lab Results  Component Value Date   TSH 3.00 03/01/2017   TSH 2.56 11/10/2015    Therapeutic Level Labs: No results found for: LITHIUM Lab Results  Component Value Date   VALPROATE 46.3 (L) 07/14/2014   No components found for:  CBMZ  Current Medications: Current Outpatient Medications  Medication Sig Dispense Refill  . ARIPiprazole (ABILIFY) 5 MG tablet  Take 1 tablet (5 mg total) daily by mouth. Take one tablet at bedtime 30 tablet 0  . aspirin 81 MG tablet Take 81 mg by mouth daily.    [name] atorvastatin (LIPITOR) 20 MG tablet Take 1 tablet (20 mg total) daily by mouth. 90 tablet 2  . benztropine (COGENTIN) 0.5 MG tablet Take 1 tablet (0.5 mg total) by mouth at bedtime. 30 tablet 0  . clonazePAM (KLONOPIN) 1 MG tablet Take 1 tablet (1 mg total) by mouth at bedtime. 30 tablet 3  . cyclobenzaprine (FLEXERIL) 5 MG tablet take 1 TABLET BY MOUTH THREE TIMES DAILY AS NEEDED for muscle spasms] 60 tablet 1  . FLUoxetine (PROZAC) 40 MG capsule TAKE 1 CAPSULE(40 MG) BY MOUTH DAILY 30 capsule 0  . mirabegron ER (MYRBETRIQ) 25 MG TB24 tablet Take 25 mg by mouth daily.    . Omega-3 Fatty Acids (FISH OIL) 1000 MG CAPS Take 1,000 mg by mouth daily.     . pantoprazole (PROTONIX) 40 MG tablet Take 1 tablet (40 mg total) by mouth daily. 90 tablet 3  . prasugrel (EFFIENT) 5 MG TABS tablet Take 1 tablet (5 mg total) by mouth daily. 90 tablet 3  . tamsulosin (FLOMAX) 0.4 MG CAPS capsule Take 0.4 mg by mouth daily after breakfast.     . traZODone (DESYREL) 150 MG tablet Take 1 tablet (150 mg total) by mouth at bedtime. 30 tablet  0  . triamcinolone (NASACORT AQ) 55 MCG/ACT AERO nasal inhaler Place 2 sprays into the nose daily. 1 Inhaler 12   No current facility-administered medications for this visit.      Musculoskeletal: Strength & Muscle Tone: decreased Gait & Station: unsteady, use stick to help balance Patient leans: Right  Psychiatric Specialty Exam: Review of Systems  Musculoskeletal: Positive for back pain and joint pain.       Right knee amputation    Blood pressure 132/84, pulse 77, height 6' 1"  (1.854 m), weight (!) 311 lb 9.6 oz (141.3 kg).Body mass index is 41.11 kg/m.  General Appearance: Casual  Eye Contact:  Fair  Speech:  Slow  Volume:  Decreased  Mood:  Anxious  Affect:  Appropriate  Thought Process:  Goal Directed  Orientation:  Full  (Time, Place, and Person)  Thought Content: Rumination   Suicidal Thoughts:  No  Homicidal Thoughts:  No  Memory:  Immediate;   Fair Recent;   Fair Remote;   Fair  Judgement:  Good  Insight:  Good  Psychomotor Activity:  Decreased  Concentration:  Concentration: Fair and Attention Span: Fair  Recall:  Good  Fund of Knowledge: Good  Language: Good  Akathisia:  No  Handed:  Right  AIMS (if indicated): not done  Assets:  Communication Skills Desire for Improvement Housing  ADL's:  Intact  Cognition: WNL  Sleep:  Good   Screenings: AUDIT     Admission (Discharged) from OP Visit from 06/25/2012 in [facility]  Alcohol Use Disorder Identification Test Final Score (AUDIT)  1    PHQ2-9     Office Visit from 11/05/2015 in [facility] Patient Outreach from 09/15/2015 in [facility] Visit from 07/14/2015 in [facility] Office Visit from 03/11/2015 in [facility] Visit from 09/09/2014 in [facility]  PHQ-2 Total Score  0  2  0  0  1  PHQ-9 Total Score  No data  5  No data  No data  No data       Assessment and Plan: Bipolar disorder type I.  Anxiety disorder NOS.  Reinforced to see pain specialist for the management of pain.  Also encouraged healthy lifestyle and watch her calorie intake since patient gained weight from the past.  Patient is not interested in counseling.  He is getting Klonopin from his primary care physician.  I will continue trazodone 150 mg at bedtime, Prozac 40 mg daily, Abilify 5 mg daily and Cogentin 0.5 mg at bedtime.  He has no tremors shakes or any EPS.  Discussed medication side effects and benefits.  Recommended to call us back if is any question or any concern.  Follow-up in 3 months.   Kathlee Nations, MD 06/21/2017, 10:39 AM

## 2017-06-22 ENCOUNTER — Other Ambulatory Visit (HOSPITAL_COMMUNITY): Payer: Self-pay | Admitting: Psychiatry

## 2017-06-22 DIAGNOSIS — F319 Bipolar disorder, unspecified: Secondary | ICD-10-CM

## 2017-07-09 ENCOUNTER — Other Ambulatory Visit: Payer: Self-pay | Admitting: Internal Medicine

## 2017-07-09 NOTE — Telephone Encounter (Signed)
Done erx 

## 2017-07-13 ENCOUNTER — Other Ambulatory Visit (INDEPENDENT_AMBULATORY_CARE_PROVIDER_SITE_OTHER): Payer: Medicare HMO

## 2017-07-13 ENCOUNTER — Encounter: Payer: Self-pay | Admitting: Internal Medicine

## 2017-07-13 ENCOUNTER — Ambulatory Visit (INDEPENDENT_AMBULATORY_CARE_PROVIDER_SITE_OTHER): Payer: Medicare HMO | Admitting: Internal Medicine

## 2017-07-13 VITALS — BP 124/82 | HR 85 | Temp 98.0°F | Ht 73.0 in | Wt 305.0 lb

## 2017-07-13 DIAGNOSIS — Z202 Contact with and (suspected) exposure to infections with a predominantly sexual mode of transmission: Secondary | ICD-10-CM

## 2017-07-13 DIAGNOSIS — M545 Low back pain, unspecified: Secondary | ICD-10-CM

## 2017-07-13 DIAGNOSIS — I10 Essential (primary) hypertension: Secondary | ICD-10-CM | POA: Diagnosis not present

## 2017-07-13 DIAGNOSIS — R69 Illness, unspecified: Secondary | ICD-10-CM | POA: Diagnosis not present

## 2017-07-13 DIAGNOSIS — R739 Hyperglycemia, unspecified: Secondary | ICD-10-CM | POA: Diagnosis not present

## 2017-07-13 DIAGNOSIS — E785 Hyperlipidemia, unspecified: Secondary | ICD-10-CM

## 2017-07-13 DIAGNOSIS — Z0000 Encounter for general adult medical examination without abnormal findings: Secondary | ICD-10-CM | POA: Diagnosis not present

## 2017-07-13 LAB — HEPATIC FUNCTION PANEL
ALBUMIN: 4.7 g/dL (ref 3.5–5.2)
ALK PHOS: 80 U/L (ref 39–117)
ALT: 15 U/L (ref 0–53)
AST: 18 U/L (ref 0–37)
Bilirubin, Direct: 0.1 mg/dL (ref 0.0–0.3)
TOTAL PROTEIN: 7.8 g/dL (ref 6.0–8.3)
Total Bilirubin: 0.6 mg/dL (ref 0.2–1.2)

## 2017-07-13 LAB — BASIC METABOLIC PANEL
BUN: 19 mg/dL (ref 6–23)
CALCIUM: 9.6 mg/dL (ref 8.4–10.5)
CHLORIDE: 102 meq/L (ref 96–112)
CO2: 26 mEq/L (ref 19–32)
CREATININE: 1.04 mg/dL (ref 0.40–1.50)
GFR: 76.15 mL/min (ref >60.00)
Glucose, Bld: 104 mg/dL — ABNORMAL HIGH (ref 70–99)
Potassium: 4.1 mEq/L (ref 3.5–5.1)
SODIUM: 137 meq/L (ref 135–145)

## 2017-07-13 LAB — LIPID PANEL
Cholesterol: 112 mg/dL (ref 0–200)
HDL: 38.1 mg/dL — AB (ref >39.00)
LDL Cholesterol: 59 mg/dL (ref 0–99)
NONHDL: 74.06
Total CHOL/HDL Ratio: 3
Triglycerides: 77 mg/dL (ref 0.0–149.0)
VLDL: 15.4 mg/dL (ref 0.0–40.0)

## 2017-07-13 LAB — HEMOGLOBIN A1C: HEMOGLOBIN A1C: 5.4 % (ref 4.6–6.5)

## 2017-07-13 MED ORDER — CYCLOBENZAPRINE HCL 5 MG PO TABS: ORAL_TABLET | ORAL | 3 refills | Status: DC

## 2017-07-13 MED ORDER — IBUPROFEN 800 MG PO TABS: 800.0000 mg | ORAL_TABLET | Freq: Four times a day (QID) | ORAL | 1 refills | Status: DC | PRN

## 2017-07-13 NOTE — Progress Notes (Signed)
Subjective:    Patient ID: Travis Fortis Sr., male    DOB: 12-15-1950, 67 y.o.   MRN: [id number]  HPI   Here with acute right low back pain, mod, constant, dull and sharp with movement, worse to bend or twist and getting up from sitting, not better with anything except some to taking advil, and Pt denies bowel or bladder change, fever, wt loss,  worsening LE pain/numbness/weakness, gait change or falls.  Also asks for STD evaluation due to recent unprotected intercourse with possible exposure.  Denies rash, swelling or penile d/c.   Pt denies polydipsia, polyuria    Past Medical History:  Diagnosis Date  . ALLERGIC RHINITIS 06/24/2009  . Allergic rhinitis 06/24/2009   Qualifier: Diagnosis of  By: [name], [name]   . Anxiety 11/12/2011   Adequate for discharge   . Arthritis    "all my joints" (09/30/2013)  . Arthritis of foot, right, degenerative 04/15/2014  . Balance disorder 03/12/2013  . Benign neoplasm of cecum   . Benign neoplasm of descending colon   . CAD (coronary artery disease) 06/24/2009   5 stents placed in 2007    . Chronic anticoagulation   . Chronic pain syndrome 10/27/2009   of ankle, shoulders, low back.  sciatica.   . Closed fracture of right foot 10/17/2014  . CORONARY ARTERY DISEASE 06/24/2009   a. s/p multiple PCIs - In 2008 he had a Taxus DES to the mild LAD, Endeavor DES to mid LCX and distal LCX. In January 2009 he had DES to distal LCX, mid LCX and proximal LCX. In November 2009 had BMS x 2 to the mid RCA. Cath 10/2011 with patent stents, noncardiac CP. LHC 01/2013: patent stents (noncardiac CP).  . DEGENERATIVE JOINT DISEASE 06/24/2009   Qualifier: Diagnosis of  By: [name], [name]   . Depression   . Depression with anxiety    Prior suicide attempt  . [place] DISEASE, LUMBAR 04/19/2010  . ERECTILE DYSFUNCTION, ORGANIC 05/30/2010  . Essential hypertension 06/24/2009   Qualifier: Diagnosis of  By: [name], [name]   . Fibromyalgia   . Fracture dislocation of ankle joint  09/02/2015  . Gait disorder 03/12/2013  . General weakness 07/14/2014  . GERD (gastroesophageal reflux disease) 09/08/2015  . Hand joint pain 06/10/2013  . Heart murmur   . Hepatitis C   . History of kidney stones   . Hyperlipidemia 07/15/2009   Qualifier: Diagnosis of  By: Aundra Dubin, MD, [place]    . HYPERLIPIDEMIA-MIXED 07/15/2009  . HYPERTENSION 06/24/2009  . Insomnia 10/04/2011  . Irregular heart beat   . Left hip pain 03/12/2013   Injected under ultrasound guidance on June 24, 2013   . Major depression 09/13/2015  . Myocardial infarction ([place]) 2008  . Non-cardiac chest pain 10/2011, 01/2013  . Obesity   . Occult blood positive stool 10/17/2014  . Open ankle fracture 09/02/2015  . OSA (obstructive sleep apnea)    not using CPAP (09/30/2013)  . Pain of right thumb 04/03/2013  . Pneumonia   . PPD positive 04/08/2015  . Pre-ulcerative corn or callous 02/06/2013  . Restless legs   . Rotator cuff tear arthropathy of both shoulders 06/10/2013   History of bilateral shoulder cuff surgery for rotator cuff tears. Reports increase in pain 09/11/2015 during physical therapy of the left shoulder.   [name] SCIATICA, LEFT 04/19/2010   Qualifier: Diagnosis of  By: [name], [name]   . Spinal stenosis in cervical region 09/26/2013  .  Spinal stenosis, lumbar region, with neurogenic claudication 09/26/2013  . Type II diabetes mellitus ([place]) 2012   no meds in 09/2014.   [name] Uncontrolled type 2 DM with peripheral circulatory disorder ([place]) 10/04/2013  . URETHRAL STRICTURE 06/24/2009   self catheterizes.    Past Surgical History:  Procedure Laterality Date  . AMPUTATION Right 06/14/2016   Procedure: AMPUTATION BELOW KNEE;  Surgeon: Newt Minion, MD;  Location: [place];  Service: Orthopedics;  Laterality: Right;  . ANKLE FUSION Right 04/15/2014   Procedure: Right Subtalar, Talonavicular Fusion;  Surgeon: Newt Minion, MD;  Location: [place];  Service: Orthopedics;  Laterality: Right;  . ANKLE FUSION Right 04/18/2016    Right Ankle Tibiocalcaneal Fusion; Removal of deep retained hardware; application of wound vac/notes 04/18/2016  . ANKLE FUSION Right 04/18/2016   Procedure: Right Ankle Tibiocalcaneal Fusion;  Surgeon: Newt Minion, MD;  Location: [place];  Service: Orthopedics;  Laterality: Right;  . ANTERIOR CERVICAL DECOMP/DISCECTOMY FUSION N/A 09/26/2013   Procedure: ANTERIOR CERVICAL DISCECTOMY FUSION C3-4, plate and screw fixation, allograft bone graft;  Surgeon: Jessy Oto, MD;  Location: [place];  Service: Orthopedics;  Laterality: N/A;  . BACK SURGERY    . BELOW KNEE LEG AMPUTATION Right 06/14/2016  . CARDIAC CATHETERIZATION  X 1  . CARPAL TUNNEL RELEASE Bilateral   . COLONOSCOPY    . COLONOSCOPY N/A 10/22/2014   Procedure: COLONOSCOPY;  Surgeon: Lafayette Dragon, MD;  Location: [facility] ENDOSCOPY;  Service: Endoscopy;  Laterality: N/A;  . CORONARY ANGIOPLASTY WITH STENT PLACEMENT     "I have 9 stents"  . ESOPHAGOGASTRODUODENOSCOPY N/A 10/19/2014   Procedure: ESOPHAGOGASTRODUODENOSCOPY (EGD);  Surgeon: Jerene Bears, MD;  Location: [facility] ENDOSCOPY;  Service: Endoscopy;  Laterality: N/A;  . FUSION OF TALONAVICULAR JOINT Right 04/15/2014   dr duda  . HERNIA REPAIR     umbilical  . INGUINAL HERNIA REPAIR Right 05/11/2015   Procedure: LAPAROSCOPIC REPAIR RIGHT  INGUINAL HERNIA;  Surgeon: Greer Pickerel, MD;  Location: [place];  Service: General;  Laterality: Right;  . INSERTION OF MESH Right 05/11/2015   Procedure: INSERTION OF MESH;  Surgeon: Greer Pickerel, MD;  Location: [place];  Service: General;  Laterality: Right;  . JOINT REPLACEMENT Bilateral    knees  . KNEE CARTILAGE SURGERY Right X 12   "~ 1/2 open; ~ 1/2 scopes"  . KNEE CARTILAGE SURGERY Left X 3   "3 scopes"  . LEFT HEART CATHETERIZATION WITH CORONARY ANGIOGRAM N/A 02/10/2013   Procedure: LEFT HEART CATHETERIZATION WITH CORONARY ANGIOGRAM;  Surgeon: Burnell Blanks, MD;  Location[facility] CATH LAB;  Service: Cardiovascular;  Laterality: N/A;  . LUMBAR  LAMINECTOMY/DECOMPRESSION MICRODISCECTOMY N/A 01/27/2014   Procedure: CENTRAL LUMBAR LAMINECTOMY L4-5 AND L3-4;  Surgeon: Jessy Oto, MD;  Location: [place];  Service: Orthopedics;  Laterality: N/A;  . ORIF ANKLE FRACTURE Right 09/02/2015   Procedure: OPEN REDUCTION INTERNAL FIXATION (ORIF) ANKLE FRACTURE;  Surgeon: Newt Minion, MD;  Location: [place];  Service: Orthopedics;  Laterality: Right;  . PERIPHERALLY INSERTED CENTRAL CATHETER INSERTION  09/02/2015  . SHOULDER ARTHROSCOPY W/ ROTATOR CUFF REPAIR Bilateral    "3 on the right; 1 on the left"  . SKIN SPLIT GRAFT Right 10/01/2015   Procedure: RIGHT ANKLE APPLY SKIN GRAFT SPLIT THICKNESS;  Surgeon: Newt Minion, MD;  Location: [place];  Service: Orthopedics;  Laterality: Right;  . TONSILLECTOMY    . TOTAL KNEE ARTHROPLASTY Bilateral 2008  . UMBILICAL HERNIA REPAIR  UHR  . URETHRAL DILATION  X 4  . VASECTOMY      reports that he quit smoking about 6 years ago. His smoking use included cigars. he has never used smokeless tobacco. He reports that he does not drink alcohol or use drugs. family history includes Cancer in his father and mother; Coronary artery disease in his other; Depression in his brother, mother, and other; Diabetes in his father; Early death in his paternal grandfather; Early death (age of onset: 7) in his maternal grandfather; Heart disease in his father, maternal grandfather, and mother; Hyperlipidemia in his father; Hypertension in his brother, father, mother, and other. No Known Allergies Current Outpatient Medications on File Prior to Visit  Medication Sig Dispense Refill  . ARIPiprazole (ABILIFY) 5 MG tablet Take 1 tablet (5 mg total) by mouth daily. Take one tablet at bedtime 90 tablet 0  . aspirin 81 MG tablet Take 81 mg by mouth daily.    [name] atorvastatin (LIPITOR) 20 MG tablet Take 1 tablet (20 mg total) daily by mouth. 90 tablet 2  . benztropine (COGENTIN) 0.5 MG tablet Take 1 tablet (0.5 mg total) by mouth at  bedtime. 90 tablet 0  . clonazePAM (KLONOPIN) 0.5 MG tablet Take 1 TABLET BY MOUTH at bedtime 30 tablet 5  . clonazePAM (KLONOPIN) 1 MG tablet Take 1 tablet (1 mg total) by mouth at bedtime. 30 tablet 3  . FLUoxetine (PROZAC) 40 MG capsule TAKE 1 CAPSULE(40 MG) BY MOUTH DAILY 90 capsule 0  . mirabegron ER (MYRBETRIQ) 25 MG TB24 tablet Take 25 mg by mouth daily.    . Omega-3 Fatty Acids (FISH OIL) 1000 MG CAPS Take 1,000 mg by mouth daily.     . pantoprazole (PROTONIX) 40 MG tablet Take 1 tablet (40 mg total) by mouth daily. 90 tablet 3  . prasugrel (EFFIENT) 5 MG TABS tablet Take 1 tablet (5 mg total) by mouth daily. 90 tablet 3  . tamsulosin (FLOMAX) 0.4 MG CAPS capsule Take 0.4 mg by mouth daily after breakfast.     . traZODone (DESYREL) 150 MG tablet Take 1 tablet (150 mg total) by mouth at bedtime. 90 tablet 0  . triamcinolone (NASACORT AQ) 55 MCG/ACT AERO nasal inhaler Place 2 sprays into the nose daily. 1 Inhaler 12   No current facility-administered medications on file prior to visit.    Review of Systems  Constitutional: Negative for other unusual diaphoresis or sweats HENT: Negative for ear discharge or swelling Eyes: Negative for other worsening visual disturbances Respiratory: Negative for stridor or other swelling  Gastrointestinal: Negative for worsening distension or other blood Genitourinary: Negative for retention or other urinary change Musculoskeletal: Negative for other MSK pain or swelling Skin: Negative for color change or other new lesions Neurological: Negative for worsening tremors and other numbness  Psychiatric/Behavioral: Negative for worsening agitation or other fatigue All other system neg per pt    Objective:   Physical Exam BP 124/82   Pulse 85   Temp 98 F (36.7 C) (Oral)   Ht 6' 1"  (1.854 m)   Wt (!) 305 lb (138.3 kg)   SpO2 96%   BMI 40.24 kg/m   VS noted,  Constitutional: Pt appears in NAD HENT: Head: NCAT.  Right Ear: External ear normal.    Left Ear: External ear normal.  Eyes: . Pupils are equal, round, and reactive to light. Conjunctivae and EOM are normal Nose: without d/c or deformity Neck: Neck supple. Gross normal ROM Cardiovascular: Normal rate and  regular rhythm.   Pulmonary/Chest: Effort normal and breath sounds without rales or wheezing.  Abd:  Soft, NT, ND, + BS, no organomegaly Spine: duffuse lower midline tender and left lumbar paravertebral spasm tender Neurological: Pt is alert. At baseline orientation, motor grossly intact Skin: Skin is warm. No rashes, other new lesions, no LE edema Psychiatric: Pt behavior is normal without agitation  No other exam findings    Assessment & Plan:

## 2017-07-13 NOTE — Patient Instructions (Signed)
Please take all new medication as prescribed - the ibuprofen and muscle relaxer as needed  Please continue all other medications as before, and refills have been done if requested.  Please have the pharmacy call with any other refills you may need.  Please continue your efforts at being more active, low cholesterol diet, and weight control.  Please keep your appointments with your specialists as you may have planned  Please go to the LAB in the Basement (turn left off the elevator) for the tests to be done today  You will be contacted by phone if any changes need to be made immediately.  Otherwise, you will receive a letter about your results with an explanation, but please check with MyChart first.  Please remember to sign up for MyChart if you have not done so, as this will be important to you in the future with finding out test results, communicating by private email, and scheduling acute appointments online when needed.  Please return in 6 months, or sooner if needed, with Lab testing done 3-5 days before

## 2017-07-14 DIAGNOSIS — Z202 Contact with and (suspected) exposure to infections with a predominantly sexual mode of transmission: Secondary | ICD-10-CM | POA: Insufficient documentation

## 2017-07-14 NOTE — Assessment & Plan Note (Signed)
stable overall by history and exam, recent data reviewed with pt, and pt to continue medical treatment as before,  to f/u any worsening symptoms or concerns  

## 2017-07-14 NOTE — Assessment & Plan Note (Signed)
stable overall by history and exam, recent data reviewed with pt, and pt to continue medical treatment as before,  to f/u any worsening symptoms or concerns BP Readings from Last 3 Encounters:  07/13/17 124/82  04/17/17 (!) 171/82  03/15/17 132/68

## 2017-07-14 NOTE — Assessment & Plan Note (Addendum)
C/w msk strain, for ibuprofen 8000 qid prn, and flexeril prn,  to f/u any worsening symptoms or concerns  Note:  Total time for pt hx, exam, review of record with pt in the room, determination of diagnoses and plan for further eval and tx is > 40 min, with over 50% spent in coordination and counseling of patient including the differential dx, tx, further evaluation and other management of acute low back pain, hyperglycemia, HTN, HLD and recent STD exposure evaluation

## 2017-07-14 NOTE — Assessment & Plan Note (Signed)
Also for STD evaluation due to pt concern about recently unprotected intercourse

## 2017-07-14 NOTE — Assessment & Plan Note (Signed)
stable overall by history and exam, recent data reviewed with pt, and pt to continue medical treatment as before,  to f/u any worsening symptoms or concerns Lab Results  Component Value Date   HGBA1C 5.4 07/13/2017

## 2017-07-16 LAB — HIV ANTIBODY (ROUTINE TESTING W REFLEX): HIV 1&2 Ab, 4th Generation: NONREACTIVE

## 2017-07-16 LAB — RPR: RPR Ser Ql: NONREACTIVE

## 2017-07-16 LAB — HSV 2 ANTIBODY, IGG: HSV 2 Glycoprotein G Ab, IgG: <0.9 index

## 2017-07-23 ENCOUNTER — Other Ambulatory Visit: Payer: Self-pay | Admitting: Internal Medicine

## 2017-08-06 DIAGNOSIS — N401 Enlarged prostate with lower urinary tract symptoms: Secondary | ICD-10-CM | POA: Diagnosis not present

## 2017-08-06 DIAGNOSIS — N35011 Post-traumatic bulbous urethral stricture: Secondary | ICD-10-CM | POA: Diagnosis not present

## 2017-08-14 ENCOUNTER — Other Ambulatory Visit: Payer: Self-pay | Admitting: Internal Medicine

## 2017-08-16 ENCOUNTER — Telehealth: Payer: Self-pay

## 2017-08-16 ENCOUNTER — Ambulatory Visit (INDEPENDENT_AMBULATORY_CARE_PROVIDER_SITE_OTHER): Payer: Medicare HMO | Admitting: Surgery

## 2017-08-16 ENCOUNTER — Encounter (INDEPENDENT_AMBULATORY_CARE_PROVIDER_SITE_OTHER): Payer: Self-pay | Admitting: Surgery

## 2017-08-16 VITALS — BP 136/79 | HR 74 | Ht 73.0 in | Wt 306.0 lb

## 2017-08-16 DIAGNOSIS — M5416 Radiculopathy, lumbar region: Secondary | ICD-10-CM | POA: Diagnosis not present

## 2017-08-16 MED ORDER — HYDROCODONE-ACETAMINOPHEN 5-325 MG PO TABS: 1.0000 | ORAL_TABLET | Freq: Two times a day (BID) | ORAL | 0 refills | Status: DC | PRN

## 2017-08-16 NOTE — Telephone Encounter (Signed)
Key: [id number]   Started prior auth today on Cover My Meds for Cyclobenzaprine request.  Request faxed today.

## 2017-08-16 NOTE — Progress Notes (Signed)
Office Visit Note   Patient: Travis Orlick Sr.           Date of Birth: [birth year]           MRN: [id number] Visit Date: 08/16/2017              Requested by: [name], MD [place], [place] [address] PCP: [name], MD   Assessment & Plan: Visit Diagnoses:  1. Radiculopathy, lumbar region     Plan: Patient has failed conservative treatment at this point with lumbar ESI's and RFA ablation, oral NSAIDs muscle relaxers etc.  I recommend getting lumbar MRI with and without contrast and we will compare this to previous study that was done in 2017.  Follow-up in office with Dr. Louanne Skye after completion to discuss results and further treatment options.  Follow-Up Instructions: Return in about 3 weeks (around 09/06/2017) for With Dr. Louanne Skye to review MRI.   Orders:  Orders Placed This Encounter  Procedures  . MR Lumbar Spine W Wo Contrast   Meds ordered this encounter  Medications  . HYDROcodone-acetaminophen (NORCO/VICODIN) 5-325 MG tablet    Sig: Take 1 tablet by mouth every 12 (twelve) hours as needed for moderate pain.    Dispense:  30 tablet    Refill:  0      Procedures: No procedures performed   Clinical Data: No additional findings.   Subjective: Chief Complaint  Patient presents with  . Lower Back - Follow-up, Pain    HPI Patient returns with complaints of low back pain and left lower extremity radiculopathy.  States that he continues have persistent pain in the left side of his low back that radiates into the left buttock and left posterior thigh.  Nothing radiating below the left knee.  He is status post right BKA.  Left leg pain and numbness and tingling described as being constant and aggravated with activity.  He said conservative treatment with lumbar ESI's, RF ablation, oral NSAIDs and muscle relaxers. Review of Systems No current cardiac pulmonary GI GU issues  Objective: Vital Signs: BP 136/79   Pulse 74   Ht 6' 1"  (1.854  m)   Wt (!) 306 lb (138.8 kg)   BMI 40.37 kg/m   Physical Exam  Constitutional: He is oriented to person, place, and time. No distress.  HENT:  Head: Normocephalic.  Eyes: Pupils are equal, round, and reactive to light. EOM are normal.  Pulmonary/Chest: No respiratory distress.  Musculoskeletal:  Lumbar paraspinal tenderness.  Patient amylase with a cane.  Positive left greater than right straight leg raise.  Neurological: He is alert and oriented to person, place, and time.  Skin: Skin is warm.    Ortho Exam  Specialty Comments:  No specialty comments available.  Imaging: No results found.   PMFS History: Patient Active Problem List   Diagnosis Date Noted  . Exposure to sexually transmitted disease (STD) 07/14/2017  . Low back pain 07/13/2017  . Leukoplakia, tongue 01/26/2017  . Acute sinus infection 10/20/2016  . Hyperglycemia 10/20/2016  . Skin lesion 10/20/2016  . S/P unilateral BKA (below knee amputation), right ([place]) 06/14/2016  . Charcot foot due to diabetes mellitus ([place])   . Charcot's arthropathy associated with type 2 diabetes mellitus ([place]) 04/11/2016  . Encounter for well adult exam with abnormal findings 11/05/2015  . Major depression 09/13/2015  . S/P TKR (total knee replacement) bilaterally 09/13/2015  . GERD (gastroesophageal reflux disease) 09/08/2015  . S/P  laparoscopic hernia repair 05/11/2015  . PPD positive 04/08/2015  . Benign neoplasm of descending colon   . Benign neoplasm of cecum   . AKI (acute kidney injury) ([place]) 10/18/2014  . Acute blood loss anemia   . Chronic anticoagulation   . Occult blood positive stool 10/17/2014  . General weakness 07/14/2014  . Urinary incontinence 07/14/2014  . Headache(784.0) 10/15/2013  . Spinal stenosis in cervical region 09/26/2013    Class: Chronic  . Spinal stenosis, lumbar region, with neurogenic claudication 09/26/2013    Class: Chronic  . Hand joint pain 06/10/2013  . Rotator cuff tear  arthropathy of both shoulders 06/10/2013  . Skin lesion of cheek 05/01/2013  . Pain of right thumb 04/03/2013  . Balance disorder 03/12/2013  . Gait disorder 03/12/2013  . Tremor 03/12/2013  . Left hip pain 03/12/2013  . Pre-ulcerative corn or callous 02/06/2013  . Anxiety 11/12/2011  . OSA (obstructive sleep apnea) 11/07/2011  . Bradycardia 10/20/2011  . Insomnia 10/04/2011  . Obesity 01/12/2011  . ERECTILE DYSFUNCTION, ORGANIC 05/30/2010  . [place] DISEASE, LUMBAR 04/19/2010  . SCIATICA, LEFT 04/19/2010  . Chronic pain syndrome 10/27/2009  . Hyperlipidemia 07/15/2009  . Essential hypertension 06/24/2009  . CAD (coronary artery disease) 06/24/2009  . Allergic rhinitis 06/24/2009  . URETHRAL STRICTURE 06/24/2009  . DEGENERATIVE JOINT DISEASE 06/24/2009  . SHOULDER PAIN, BILATERAL 06/24/2009  . FATIGUE 06/24/2009  . NEPHROLITHIASIS, HX OF 06/24/2009   Past Medical History:  Diagnosis Date  . ALLERGIC RHINITIS 06/24/2009  . Allergic rhinitis 06/24/2009   Qualifier: Diagnosis of  By: [name], [name]   . Anxiety 11/12/2011   Adequate for discharge   . Arthritis    "all my joints" (09/30/2013)  . Arthritis of foot, right, degenerative 04/15/2014  . Balance disorder 03/12/2013  . Benign neoplasm of cecum   . Benign neoplasm of descending colon   . CAD (coronary artery disease) 06/24/2009   5 stents placed in 2007    . Chronic anticoagulation   . Chronic pain syndrome 10/27/2009   of ankle, shoulders, low back.  sciatica.   . Closed fracture of right foot 10/17/2014  . CORONARY ARTERY DISEASE 06/24/2009   a. s/p multiple PCIs - In 2008 he had a Taxus DES to the mild LAD, Endeavor DES to mid LCX and distal LCX. In January 2009 he had DES to distal LCX, mid LCX and proximal LCX. In November 2009 had BMS x 2 to the mid RCA. Cath 10/2011 with patent stents, noncardiac CP. LHC 01/2013: patent stents (noncardiac CP).  . DEGENERATIVE JOINT DISEASE 06/24/2009   Qualifier: Diagnosis of  By: [name],  [name]   . Depression   . Depression with anxiety    Prior suicide attempt  . [place] DISEASE, LUMBAR 04/19/2010  . ERECTILE DYSFUNCTION, ORGANIC 05/30/2010  . Essential hypertension 06/24/2009   Qualifier: Diagnosis of  By: [name], [name]   . Fibromyalgia   . Fracture dislocation of ankle joint 09/02/2015  . Gait disorder 03/12/2013  . General weakness 07/14/2014  . GERD (gastroesophageal reflux disease) 09/08/2015  . Hand joint pain 06/10/2013  . Heart murmur   . Hepatitis C   . History of kidney stones   . Hyperlipidemia 07/15/2009   Qualifier: Diagnosis of  By: Aundra Dubin, MD, [place]    . HYPERLIPIDEMIA-MIXED 07/15/2009  . HYPERTENSION 06/24/2009  . Insomnia 10/04/2011  . Irregular heart beat   . Left hip pain 03/12/2013   Injected under ultrasound guidance on  June 24, 2013   . Major depression 09/13/2015  . Myocardial infarction ([place]) 2008  . Non-cardiac chest pain 10/2011, 01/2013  . Obesity   . Occult blood positive stool 10/17/2014  . Open ankle fracture 09/02/2015  . OSA (obstructive sleep apnea)    not using CPAP (09/30/2013)  . Pain of right thumb 04/03/2013  . Pneumonia   . PPD positive 04/08/2015  . Pre-ulcerative corn or callous 02/06/2013  . Restless legs   . Rotator cuff tear arthropathy of both shoulders 06/10/2013   History of bilateral shoulder cuff surgery for rotator cuff tears. Reports increase in pain 09/11/2015 during physical therapy of the left shoulder.   [name] SCIATICA, LEFT 04/19/2010   Qualifier: Diagnosis of  By: [name], [name]   . Spinal stenosis in cervical region 09/26/2013  . Spinal stenosis, lumbar region, with neurogenic claudication 09/26/2013  . Type II diabetes mellitus ([facility]) 2012   no meds in 09/2014.   [name] Uncontrolled type 2 DM with peripheral circulatory disorder ([place]) 10/04/2013  . URETHRAL STRICTURE 06/24/2009   self catheterizes.     Family History  Problem Relation Age of Onset  . Depression Mother   . Heart disease Mother   . Hypertension Mother     . Cancer Mother        Breast  . Diabetes Father   . Heart disease Father        CABG  . Cancer Father        prostate and skin cancer  . Hypertension Father   . Hyperlipidemia Father   . Depression Brother        x 2  . Hypertension Brother        x2  . Heart disease Maternal Grandfather   . Early death Maternal Grandfather 32       heart attack  . Early death Paternal Grandfather   . Coronary artery disease Other   . Hypertension Other   . Depression Other     Past Surgical History:  Procedure Laterality Date  . AMPUTATION Right 06/14/2016   Procedure: AMPUTATION BELOW KNEE;  Surgeon: Newt Minion, MD;  Location: [facility];  Service: Orthopedics;  Laterality: Right;  . ANKLE FUSION Right 04/15/2014   Procedure: Right Subtalar, Talonavicular Fusion;  Surgeon: Newt Minion, MD;  Location: [facility];  Service: Orthopedics;  Laterality: Right;  . ANKLE FUSION Right 04/18/2016   Right Ankle Tibiocalcaneal Fusion; Removal of deep retained hardware; application of wound vac/notes 04/18/2016  . ANKLE FUSION Right 04/18/2016   Procedure: Right Ankle Tibiocalcaneal Fusion;  Surgeon: Newt Minion, MD;  Location: [facility];  Service: Orthopedics;  Laterality: Right;  . ANTERIOR CERVICAL DECOMP/DISCECTOMY FUSION N/A 09/26/2013   Procedure: ANTERIOR CERVICAL DISCECTOMY FUSION C3-4, plate and screw fixation, allograft bone graft;  Surgeon: Jessy Oto, MD;  Location: [facility];  Service: Orthopedics;  Laterality: N/A;  . BACK SURGERY    . BELOW KNEE LEG AMPUTATION Right 06/14/2016  . CARDIAC CATHETERIZATION  X 1  . CARPAL TUNNEL RELEASE Bilateral   . COLONOSCOPY    . COLONOSCOPY N/A 10/22/2014   Procedure: COLONOSCOPY;  Surgeon: Lafayette Dragon, MD;  Location: [facility] ENDOSCOPY;  Service: Endoscopy;  Laterality: N/A;  . CORONARY ANGIOPLASTY WITH STENT PLACEMENT     "I have 9 stents"  . ESOPHAGOGASTRODUODENOSCOPY N/A 10/19/2014   Procedure: ESOPHAGOGASTRODUODENOSCOPY (EGD);  Surgeon: Jerene Bears, MD;   Location: [facility] ENDOSCOPY;  Service: Endoscopy;  Laterality: N/A;  .  FUSION OF TALONAVICULAR JOINT Right 04/15/2014   dr duda  . HERNIA REPAIR     umbilical  . INGUINAL HERNIA REPAIR Right 05/11/2015   Procedure: LAPAROSCOPIC REPAIR RIGHT  INGUINAL HERNIA;  Surgeon: Greer Pickerel, MD;  Location: [facility];  Service: General;  Laterality: Right;  . INSERTION OF MESH Right 05/11/2015   Procedure: INSERTION OF MESH;  Surgeon: Greer Pickerel, MD;  Location: [facility];  Service: General;  Laterality: Right;  . JOINT REPLACEMENT Bilateral    knees  . KNEE CARTILAGE SURGERY Right X 12   "~ 1/2 open; ~ 1/2 scopes"  . KNEE CARTILAGE SURGERY Left X 3   "3 scopes"  . LEFT HEART CATHETERIZATION WITH CORONARY ANGIOGRAM N/A 02/10/2013   Procedure: LEFT HEART CATHETERIZATION WITH CORONARY ANGIOGRAM;  Surgeon: Burnell Blanks, MD;  Location: [facility] CATH LAB;  Service: Cardiovascular;  Laterality: N/A;  . LUMBAR LAMINECTOMY/DECOMPRESSION MICRODISCECTOMY N/A 01/27/2014   Procedure: CENTRAL LUMBAR LAMINECTOMY L4-5 AND L3-4;  Surgeon: Jessy Oto, MD;  Location: [facility];  Service: Orthopedics;  Laterality: N/A;  . ORIF ANKLE FRACTURE Right 09/02/2015   Procedure: OPEN REDUCTION INTERNAL FIXATION (ORIF) ANKLE FRACTURE;  Surgeon: Newt Minion, MD;  Location: [facility];  Service: Orthopedics;  Laterality: Right;  . PERIPHERALLY INSERTED CENTRAL CATHETER INSERTION  09/02/2015  . SHOULDER ARTHROSCOPY W/ ROTATOR CUFF REPAIR Bilateral    "3 on the right; 1 on the left"  . SKIN SPLIT GRAFT Right 10/01/2015   Procedure: RIGHT ANKLE APPLY SKIN GRAFT SPLIT THICKNESS;  Surgeon: Newt Minion, MD;  Location: [facility];  Service: Orthopedics;  Laterality: Right;  . TONSILLECTOMY    . TOTAL KNEE ARTHROPLASTY Bilateral 2008  . UMBILICAL HERNIA REPAIR     UHR  . URETHRAL DILATION  X 4  . VASECTOMY     Social History   Occupational History  . Occupation: disabled since 2006 due to ortho. heart, psych    Employer: UNEMPLOYED  . Occupation:  part time work as an Multimedia programmer, wrestling, and Holiday representative  Tobacco Use  . Smoking status: Former Smoker    Types: Cigars    Last attempt to quit: 08/28/2010    Years since quitting: 6.9  . Smokeless tobacco: Never Used  . Tobacco comment: 04/18/2016 "smoked 1 cigar/wk when I did smoke"  Substance and Sexual Activity  . Alcohol use: No    Alcohol/week: 0.0 oz    Comment: rarely  . Drug use: No  . Sexual activity: Not Currently

## 2017-08-17 ENCOUNTER — Telehealth: Payer: Self-pay

## 2017-08-17 ENCOUNTER — Other Ambulatory Visit: Payer: Self-pay | Admitting: Internal Medicine

## 2017-08-17 NOTE — Telephone Encounter (Signed)
Approved 05/20/17-05/21/18  Determination sent to scan

## 2017-08-20 ENCOUNTER — Other Ambulatory Visit: Payer: Self-pay

## 2017-08-28 ENCOUNTER — Other Ambulatory Visit: Payer: Self-pay

## 2017-09-03 DIAGNOSIS — R6889 Other general symptoms and signs: Secondary | ICD-10-CM | POA: Diagnosis not present

## 2017-09-03 DIAGNOSIS — K120 Recurrent oral aphthae: Secondary | ICD-10-CM | POA: Diagnosis not present

## 2017-09-05 ENCOUNTER — Ambulatory Visit
Admission: RE | Admit: 2017-09-05 | Discharge: 2017-09-05 | Disposition: A | Discharge status: Home / Self Care | Payer: Medicare HMO | Source: Ambulatory Visit | Attending: Surgery | Admitting: Surgery

## 2017-09-05 DIAGNOSIS — M5126 Other intervertebral disc displacement, lumbar region: Secondary | ICD-10-CM | POA: Diagnosis not present

## 2017-09-05 DIAGNOSIS — M5416 Radiculopathy, lumbar region: Secondary | ICD-10-CM

## 2017-09-05 DIAGNOSIS — R6889 Other general symptoms and signs: Secondary | ICD-10-CM | POA: Diagnosis not present

## 2017-09-05 DIAGNOSIS — M48061 Spinal stenosis, lumbar region without neurogenic claudication: Secondary | ICD-10-CM | POA: Diagnosis not present

## 2017-09-05 MED ORDER — GADOBENATE DIMEGLUMINE 529 MG/ML IV SOLN
20.0000 mL | Freq: Once | INTRAVENOUS | Status: AC | PRN
  Administered 2017-09-05: 20 mL via INTRAVENOUS

## 2017-09-10 ENCOUNTER — Ambulatory Visit (INDEPENDENT_AMBULATORY_CARE_PROVIDER_SITE_OTHER): Payer: Medicare HMO

## 2017-09-10 ENCOUNTER — Encounter (INDEPENDENT_AMBULATORY_CARE_PROVIDER_SITE_OTHER): Payer: Self-pay | Admitting: Specialist

## 2017-09-10 ENCOUNTER — Ambulatory Visit (INDEPENDENT_AMBULATORY_CARE_PROVIDER_SITE_OTHER): Payer: Medicare HMO | Admitting: Specialist

## 2017-09-10 VITALS — BP 145/73 | HR 76 | Ht 73.0 in | Wt 298.0 lb

## 2017-09-10 DIAGNOSIS — M25552 Pain in left hip: Secondary | ICD-10-CM

## 2017-09-10 DIAGNOSIS — M5136 Other intervertebral disc degeneration, lumbar region: Secondary | ICD-10-CM

## 2017-09-10 DIAGNOSIS — M1612 Unilateral primary osteoarthritis, left hip: Secondary | ICD-10-CM

## 2017-09-10 DIAGNOSIS — M4726 Other spondylosis with radiculopathy, lumbar region: Secondary | ICD-10-CM | POA: Diagnosis not present

## 2017-09-10 DIAGNOSIS — R6889 Other general symptoms and signs: Secondary | ICD-10-CM | POA: Diagnosis not present

## 2017-09-10 MED ORDER — TRAMADOL HCL 50 MG PO TABS: 50.0000 mg | ORAL_TABLET | Freq: Four times a day (QID) | ORAL | 3 refills | Status: DC | PRN

## 2017-09-10 NOTE — Progress Notes (Signed)
Office Visit Note   Patient: Travis Rambert Sr.           Date of Birth: [birth year]           MRN: [id number] Visit Date: 09/10/2017              Requested by: [name], MD [place], [place] [address] PCP: [name], MD   Assessment & Plan: Visit Diagnoses:  1. Pain in left hip   2. Unilateral primary osteoarthritis, left hip   3. Other spondylosis with radiculopathy, lumbar region   4. DDD (degenerative disc disease), lumbar     Plan:Avoid frequent bending and stooping  No lifting greater than 10 lbs. May use ice or moist heat for pain. Weight loss is of benefit. HAvoid bending, stooping and avoid lifting weights greater than 10 lbs. Avoid prolong standing and walking. Avoid frequent bending and stooping  No lifting greater than 10 lbs. May use ice or moist heat for pain. Weight loss is of benefit. Handicap license is approved. Dr. Romona Curls secretary/Assistant will call to arrange for epidural steroid injection andicap license is approved.   Follow-Up Instructions: Return in about 6 weeks (around 10/22/2017).   Orders:  Orders Placed This Encounter  Procedures  . XR HIP UNILAT W OR W/O PELVIS 2-3 VIEWS LEFT  . Ambulatory referral to Physical Medicine Rehab   Meds ordered this encounter  Medications  . traMADol (ULTRAM) 50 MG tablet    Sig: Take 1 tablet (50 mg total) by mouth every 6 (six) hours as needed for severe pain.    Dispense:  30 tablet    Refill:  3      Procedures: No procedures performed   Clinical Data: No additional findings.   Subjective: Chief Complaint  Patient presents with  . Lower Back - Follow-up    67 year old male with history of low back and left buttock pain. Pain is worse with sitting, bending and stooping and riding in a car. Pain with any lifting. Standing and walking he is doing more around his neighborhood to improve his strength and endurance. Walking 1/4th of a mile, it is mostly up hill.  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  No bowel or bladder difficulties. Constipated and 2 days later diarrhea, 5-6 liters of water per day.   Review of Systems   Objective: Vital Signs: BP (!) 145/73 (BP Location: Left Arm, Patient Position: Sitting)   Pulse 76   Ht 6' 1"  (1.854 m)   Wt 298 lb (135.2 kg)   BMI 39.32 kg/m   Physical Exam  Back Exam   Tenderness  The patient is experiencing tenderness in the lumbar.  Range of Motion  Extension: normal  Flexion: abnormal  Lateral bend right: abnormal  Lateral bend left: abnormal  Rotation right: abnormal  Rotation left: abnormal   Muscle Strength  Right Quadriceps:  5/5  Left Quadriceps:  5/5  Right Hamstrings:  5/5  Left Hamstrings:  5/5   Tests  Straight leg raise right: negative Straight leg raise left: negative  Reflexes  Patellar: abnormal Achilles:  4/4 abnormal Biceps:  4/4 abnormal Babinski's sign: abnormal   Other  Toe walk: normal Heel walk: normal Sensation: normal Gait: abnormal   Comments:  Right BKA with prosthetic replacement.       Specialty Comments:  No specialty comments available.  Imaging: Xr Hip Unilat W Or W/o Pelvis 2-3 Views  Left  Result Date: 09/10/2017 AP pelvis and lateral left hip show flattening of the lateral femoral head-neck consistent with FAIS joint line is well maintained. The right hip shows decreased head spericity, narrowing of the medial joint line Consistent with mild OA right hip.    PMFS History: Patient Active Problem List   Diagnosis Date Noted  . Spinal stenosis in cervical region 09/26/2013    Priority: High    Class: Chronic  . Spinal stenosis, lumbar region, with neurogenic claudication 09/26/2013    Priority: High    Class: Chronic  . Exposure to sexually transmitted disease (STD) 07/14/2017  . Low back pain 07/13/2017  . Leukoplakia, tongue 01/26/2017  . Acute sinus infection 10/20/2016  . Hyperglycemia  10/20/2016  . Skin lesion 10/20/2016  . S/P unilateral BKA (below knee amputation), right ([place]) 06/14/2016  . Charcot foot due to diabetes mellitus ([place])   . Charcot's arthropathy associated with type 2 diabetes mellitus ([place]) 04/11/2016  . Encounter for well adult exam with abnormal findings 11/05/2015  . Major depression 09/13/2015  . S/P TKR (total knee replacement) bilaterally 09/13/2015  . GERD (gastroesophageal reflux disease) 09/08/2015  . S/P laparoscopic hernia repair 05/11/2015  . PPD positive 04/08/2015  . Benign neoplasm of descending colon   . Benign neoplasm of cecum   . AKI (acute kidney injury) ([place]) 10/18/2014  . Acute blood loss anemia   . Chronic anticoagulation   . Occult blood positive stool 10/17/2014  . General weakness 07/14/2014  . Urinary incontinence 07/14/2014  . Headache(784.0) 10/15/2013  . Hand joint pain 06/10/2013  . Rotator cuff tear arthropathy of both shoulders 06/10/2013  . Skin lesion of cheek 05/01/2013  . Pain of right thumb 04/03/2013  . Balance disorder 03/12/2013  . Gait disorder 03/12/2013  . Tremor 03/12/2013  . Left hip pain 03/12/2013  . Pre-ulcerative corn or callous 02/06/2013  . Anxiety 11/12/2011  . OSA (obstructive sleep apnea) 11/07/2011  . Bradycardia 10/20/2011  . Insomnia 10/04/2011  . Obesity 01/12/2011  . ERECTILE DYSFUNCTION, ORGANIC 05/30/2010  . [place] DISEASE, LUMBAR 04/19/2010  . SCIATICA, LEFT 04/19/2010  . Chronic pain syndrome 10/27/2009  . Hyperlipidemia 07/15/2009  . Essential hypertension 06/24/2009  . CAD (coronary artery disease) 06/24/2009  . Allergic rhinitis 06/24/2009  . URETHRAL STRICTURE 06/24/2009  . DEGENERATIVE JOINT DISEASE 06/24/2009  . SHOULDER PAIN, BILATERAL 06/24/2009  . FATIGUE 06/24/2009  . NEPHROLITHIASIS, HX OF 06/24/2009   Past Medical History:  Diagnosis Date  . ALLERGIC RHINITIS 06/24/2009  . Allergic rhinitis 06/24/2009   Qualifier: Diagnosis of  By: [name], [name]   .  Anxiety 11/12/2011   Adequate for discharge   . Arthritis    "all my joints" (09/30/2013)  . Arthritis of foot, right, degenerative 04/15/2014  . Balance disorder 03/12/2013  . Benign neoplasm of cecum   . Benign neoplasm of descending colon   . CAD (coronary artery disease) 06/24/2009   5 stents placed in 2007    . Chronic anticoagulation   . Chronic pain syndrome 10/27/2009   of ankle, shoulders, low back.  sciatica.   . Closed fracture of right foot 10/17/2014  . CORONARY ARTERY DISEASE 06/24/2009   a. s/p multiple PCIs - In 2008 he had a Taxus DES to the mild LAD, Endeavor DES to mid LCX and distal LCX. In January 2009 he had DES to distal LCX, mid LCX and proximal LCX. In November 2009 had BMS x 2 to the mid RCA. Cath 10/2011  with patent stents, noncardiac CP. LHC 01/2013: patent stents (noncardiac CP).  . DEGENERATIVE JOINT DISEASE 06/24/2009   Qualifier: Diagnosis of  By: [name], [name]   . Depression   . Depression with anxiety    Prior suicide attempt  . [place] DISEASE, LUMBAR 04/19/2010  . ERECTILE DYSFUNCTION, ORGANIC 05/30/2010  . Essential hypertension 06/24/2009   Qualifier: Diagnosis of  By: [name], [name]   . Fibromyalgia   . Fracture dislocation of ankle joint 09/02/2015  . Gait disorder 03/12/2013  . General weakness 07/14/2014  . GERD (gastroesophageal reflux disease) 09/08/2015  . Hand joint pain 06/10/2013  . Heart murmur   . Hepatitis C   . History of kidney stones   . Hyperlipidemia 07/15/2009   Qualifier: Diagnosis of  By: Aundra Dubin, MD, [place]    . HYPERLIPIDEMIA-MIXED 07/15/2009  . HYPERTENSION 06/24/2009  . Insomnia 10/04/2011  . Irregular heart beat   . Left hip pain 03/12/2013   Injected under ultrasound guidance on June 24, 2013   . Major depression 09/13/2015  . Myocardial infarction ([place]) 2008  . Non-cardiac chest pain 10/2011, 01/2013  . Obesity   . Occult blood positive stool 10/17/2014  . Open ankle fracture 09/02/2015  . OSA (obstructive sleep apnea)    not  using CPAP (09/30/2013)  . Pain of right thumb 04/03/2013  . Pneumonia   . PPD positive 04/08/2015  . Pre-ulcerative corn or callous 02/06/2013  . Restless legs   . Rotator cuff tear arthropathy of both shoulders 06/10/2013   History of bilateral shoulder cuff surgery for rotator cuff tears. Reports increase in pain 09/11/2015 during physical therapy of the left shoulder.   [name] SCIATICA, LEFT 04/19/2010   Qualifier: Diagnosis of  By: [name], [name]   . Spinal stenosis in cervical region 09/26/2013  . Spinal stenosis, lumbar region, with neurogenic claudication 09/26/2013  . Type II diabetes mellitus ([place]) 2012   no meds in 09/2014.   [name] Uncontrolled type 2 DM with peripheral circulatory disorder ([place]) 10/04/2013  . URETHRAL STRICTURE 06/24/2009   self catheterizes.     Family History  Problem Relation Age of Onset  . Depression Mother   . Heart disease Mother   . Hypertension Mother   . Cancer Mother        Breast  . Diabetes Father   . Heart disease Father        CABG  . Cancer Father        prostate and skin cancer  . Hypertension Father   . Hyperlipidemia Father   . Depression Brother        x 2  . Hypertension Brother        x2  . Heart disease Maternal Grandfather   . Early death Maternal Grandfather 80       heart attack  . Early death Paternal Grandfather   . Coronary artery disease Other   . Hypertension Other   . Depression Other     Past Surgical History:  Procedure Laterality Date  . AMPUTATION Right 06/14/2016   Procedure: AMPUTATION BELOW KNEE;  Surgeon: Newt Minion, MD;  Location: [facility];  Service: Orthopedics;  Laterality: Right;  . ANKLE FUSION Right 04/15/2014   Procedure: Right Subtalar, Talonavicular Fusion;  Surgeon: Newt Minion, MD;  Location: [facility];  Service: Orthopedics;  Laterality: Right;  . ANKLE FUSION Right 04/18/2016   Right Ankle Tibiocalcaneal Fusion; Removal of deep retained hardware; application of wound vac/notes  04/18/2016  . ANKLE FUSION  Right 04/18/2016   Procedure: Right Ankle Tibiocalcaneal Fusion;  Surgeon: Newt Minion, MD;  Location: [facility];  Service: Orthopedics;  Laterality: Right;  . ANTERIOR CERVICAL DECOMP/DISCECTOMY FUSION N/A 09/26/2013   Procedure: ANTERIOR CERVICAL DISCECTOMY FUSION C3-4, plate and screw fixation, allograft bone graft;  Surgeon: Jessy Oto, MD;  Location: [facility];  Service: Orthopedics;  Laterality: N/A;  . BACK SURGERY    . BELOW KNEE LEG AMPUTATION Right 06/14/2016  . CARDIAC CATHETERIZATION  X 1  . CARPAL TUNNEL RELEASE Bilateral   . COLONOSCOPY    . COLONOSCOPY N/A 10/22/2014   Procedure: COLONOSCOPY;  Surgeon: Lafayette Dragon, MD;  Location: [facility] ENDOSCOPY;  Service: Endoscopy;  Laterality: N/A;  . CORONARY ANGIOPLASTY WITH STENT PLACEMENT     "I have 9 stents"  . ESOPHAGOGASTRODUODENOSCOPY N/A 10/19/2014   Procedure: ESOPHAGOGASTRODUODENOSCOPY (EGD);  Surgeon: Jerene Bears, MD;  Location: [facility] END[facility]COPY;  Service: Endoscopy;  Laterality: N/A;  . FUSION OF TALONAVICULAR JOINT Right 04/15/2014   dr duda  . HERNIA REPAIR     umbilical  . INGUINAL HERNIA REPAIR Right 05/11/2015   Procedure: LAPAROSCOPIC REPAIR RIGHT  INGUINAL HERNIA;  Surgeon: Greer Pickerel, MD;  Location: [place];  Service: General;  Laterality: Right;  . INSERTION OF MESH Right 05/11/2015   Procedure: INSERTION OF MESH;  Surgeon: Greer Pickerel, MD;  Location: [place];  Service: General;  Laterality: Right;  . JOINT REPLACEMENT Bilateral    knees  . KNEE CARTILAGE SURGERY Right X 12   "~ 1/2 open; ~ 1/2 scopes"  . KNEE CARTILAGE SURGERY Left X 3   "3 scopes"  . LEFT HEART CATHETERIZATION WITH CORONARY ANGIOGRAM N/A 02/10/2013   Procedure: LEFT HEART CATHETERIZATION WITH CORONARY ANGIOGRAM;  Surgeon: Burnell Blanks, MD;  Location[facility] CATH LAB;  Service: Cardiovascular;  Laterality: N/A;  . LUMBAR LAMINECTOMY/DECOMPRESSION MICRODISCECTOMY N/A 01/27/2014   Procedure: CENTRAL LUMBAR LAMINECTOMY L4-5 AND L3-4;  Surgeon: Jessy Oto,  MD;  Location: [facility];  Service: Orthopedics;  Laterality: N/A;  . ORIF ANKLE FRACTURE Right 09/02/2015   Procedure: OPEN REDUCTION INTERNAL FIXATION (ORIF) ANKLE FRACTURE;  Surgeon: Newt Minion, MD;  Location: [facility];  Service: Orthopedics;  Laterality: Right;  . PERIPHERALLY INSERTED CENTRAL CATHETER INSERTION  09/02/2015  . SHOULDER ARTHROSCOPY W/ ROTATOR CUFF REPAIR Bilateral    "3 on the right; 1 on the left"  . SKIN SPLIT GRAFT Right 10/01/2015   Procedure: RIGHT ANKLE APPLY SKIN GRAFT SPLIT THICKNESS;  Surgeon: Newt Minion, MD;  Location: [facility];  Service: Orthopedics;  Laterality: Right;  . TONSILLECTOMY    . TOTAL KNEE ARTHROPLASTY Bilateral 2008  . UMBILICAL HERNIA REPAIR     UHR  . URETHRAL DILATION  X 4  . VASECTOMY     Social History   Occupational History  . Occupation: disabled since 2006 due to ortho. heart, psych    Employer: UNEMPLOYED  . Occupation: part time work as an Multimedia programmer, wrestling, and Holiday representative  Tobacco Use  . Smoking status: Former Smoker    Types: Cigars    Last attempt to quit: 08/28/2010    Years since quitting: 7.0  . Smokeless tobacco: Never Used  . Tobacco comment: 04/18/2016 "smoked 1 cigar/wk when I did smoke"  Substance and Sexual Activity  . Alcohol use: No    Alcohol/week: 0.0 oz    Comment: rarely  . Drug use: No  . Sexual  activity: Not Currently

## 2017-09-10 NOTE — Patient Instructions (Addendum)
Avoid frequent bending and stooping  No lifting greater than 10 lbs. May use ice or moist heat for pain. Weight loss is of benefit. HAvoid bending, stooping and avoid lifting weights greater than 10 lbs. Avoid prolong standing and walking. Avoid frequent bending and stooping  No lifting greater than 10 lbs. May use ice or moist heat for pain. Weight loss is of benefit. Handicap license is approved. Dr. Romona Curls secretary/Assistant will call to arrange for epidural steroid injection andicap license is approved.

## 2017-09-11 ENCOUNTER — Telehealth (INDEPENDENT_AMBULATORY_CARE_PROVIDER_SITE_OTHER): Payer: Self-pay | Admitting: *Deleted

## 2017-09-11 NOTE — Telephone Encounter (Signed)
Pre-op pool needs to address.

## 2017-09-11 NOTE — Telephone Encounter (Signed)
Dr. Saunders Revel- this is a prior pt of Dr. Aundra Dubin, now assigned to you. He is on Effient and there is a request to hold the effient for an epidural steroid injection with Dr. Ernestina Patches at [facility]. May pt hold the effient for 10 days prior to epidural steroid injection?   If you would like for Dr. Aundra Dubin to address this please let us know.   Please route your response to CV DIV PREOP

## 2017-09-13 NOTE — Telephone Encounter (Signed)
   Chart reviewed as part of pre-operative protocol coverage. See notes below from Dr. Saunders Revel. Because of [name]'s past medical history and time since last visit, he/she will require a follow-up visit in order to better assess preoperative cardiovascular risk.  Pre-op covering staff: - Please schedule appointment and call patient to inform them. - Please contact requesting surgeon's office via preferred method (i.e, phone, fax) to inform them of need for appointment prior to surgery.  Charlie Pitter, PA-C  09/13/2017, 3:50 PM

## 2017-09-13 NOTE — Telephone Encounter (Signed)
It appears that [name] has a complicated coronary history with multiple stents (including Taxus) on indefinite DAPT.  He is also reportedly a non-responder to clopidogrel.  As I have never met the patient and he was last seen in our office in 12/2016, I would prefer that he be seen by me or an APP before giving final recommendations.  Nelva Bush, MD [facility] Pager: [phone]

## 2017-09-13 NOTE — Telephone Encounter (Signed)
It appears prior APP intended to route this note to Dr. Saunders Revel. Will route as noted. Will also cc Gae Bon to make her aware (need to put physician name under Routing in telephone call to reach intended destination). Dayna Dunn PA-C

## 2017-09-13 NOTE — Telephone Encounter (Signed)
Reached out to pt several times and phone rang busy busy busy. Will leave in the preop pool so someone can try again tomorrow.

## 2017-09-14 NOTE — Telephone Encounter (Addendum)
Patient is scheduled for an appointment on 09/19/17 at 8:30 a.  Unable to reach [employer].

## 2017-09-18 ENCOUNTER — Encounter (HOSPITAL_COMMUNITY): Payer: Self-pay | Admitting: Psychiatry

## 2017-09-18 ENCOUNTER — Ambulatory Visit (INDEPENDENT_AMBULATORY_CARE_PROVIDER_SITE_OTHER): Payer: Medicare HMO | Admitting: Psychiatry

## 2017-09-18 DIAGNOSIS — Z87891 Personal history of nicotine dependence: Secondary | ICD-10-CM

## 2017-09-18 DIAGNOSIS — Z736 Limitation of activities due to disability: Secondary | ICD-10-CM

## 2017-09-18 DIAGNOSIS — Z560 Unemployment, unspecified: Secondary | ICD-10-CM | POA: Diagnosis not present

## 2017-09-18 DIAGNOSIS — F319 Bipolar disorder, unspecified: Secondary | ICD-10-CM

## 2017-09-18 DIAGNOSIS — F411 Generalized anxiety disorder: Secondary | ICD-10-CM

## 2017-09-18 DIAGNOSIS — Z818 Family history of other mental and behavioral disorders: Secondary | ICD-10-CM | POA: Diagnosis not present

## 2017-09-18 DIAGNOSIS — R6889 Other general symptoms and signs: Secondary | ICD-10-CM | POA: Diagnosis not present

## 2017-09-18 MED ORDER — ARIPIPRAZOLE 5 MG PO TABS: 5.0000 mg | ORAL_TABLET | Freq: Every day | ORAL | 0 refills | Status: DC

## 2017-09-18 MED ORDER — FLUOXETINE HCL 40 MG PO CAPS: ORAL_CAPSULE | ORAL | 0 refills | Status: DC

## 2017-09-18 MED ORDER — TRAZODONE HCL 150 MG PO TABS: 150.0000 mg | ORAL_TABLET | Freq: Every day | ORAL | 0 refills | Status: DC

## 2017-09-18 MED ORDER — BENZTROPINE MESYLATE 0.5 MG PO TABS: 0.5000 mg | ORAL_TABLET | Freq: Every day | ORAL | 0 refills | Status: DC

## 2017-09-18 NOTE — Progress Notes (Signed)
BH MD/PA/NP OP Progress Note  09/18/2017 10:34 AM Travis Fortis Sr.  MRN:  [id number]  Chief Complaint: I am doing good on my medication.  I am walking almost 2 miles a day.  I lost weight  HPI: Travis [name] came for his follow-up appointment.  He is taking his psychiatric medication and denies any side effects.  He is walking almost 2 miles a day and is also watching his calorie intake.  He cut down his carb and he lost 10 pounds since the last visit.  His energy level is good.  He denies any irritability, anger, mania, psychosis or any hallucination.  He is sleeping good.  He has no tremors or shakes.  He is taking tramadol for pain and does not want to take any controlled substance.  Recently he is seeing his physician and he had blood work.  His hemoglobin A1c 6.4.  Is not taking any medication for diabetes and is trying to lose weight.  Patient lives with his niece and her family.  He denies drinking alcohol or using any illegal substances.  Once a week he socialize with his friends.    Visit Diagnosis:    ICD-10-CM   1. Bipolar 1 disorder (HCC) F31.9 traZODone (DESYREL) 150 MG tablet    FLUoxetine (PROZAC) 40 MG capsule    ARIPiprazole (ABILIFY) 5 MG tablet    benztropine (COGENTIN) 0.5 MG tablet    Past Psychiatric History: Reviewed. Patient has history of bipolar disorder and he seeing psychiatrists since age 77. He saw psychiatrist in [place] and [place]. He has history of severe mood swings, anger issues, hallucination, irritability and poor impulse control and excessive spending. He married 5 times and he has 2 children but patient has no contact with them.He has one psychiatric hospitalization in 2014 when he took overdose on Xanax with alcohol. He had history of 2 suicidal gesture in the past. Patient attended intensive outpatient program in the past. He had tried Effexor, Xanax, Klonopin, Ambien, Lunesta, Wellbutrin, Ambien and Remeron. He was started on Abilify from this office.    Past Medical History:  Past Medical History:  Diagnosis Date  . ALLERGIC RHINITIS 06/24/2009  . Allergic rhinitis 06/24/2009   Qualifier: Diagnosis of  By: [name], [name]   . Anxiety 11/12/2011   Adequate for discharge   . Arthritis    "all my joints" (09/30/2013)  . Arthritis of foot, right, degenerative 04/15/2014  . Balance disorder 03/12/2013  . Benign neoplasm of cecum   . Benign neoplasm of descending colon   . CAD (coronary artery disease) 06/24/2009   5 stents placed in 2007    . Chronic anticoagulation   . Chronic pain syndrome 10/27/2009   of ankle, shoulders, low back.  sciatica.   . Closed fracture of right foot 10/17/2014  . CORONARY ARTERY DISEASE 06/24/2009   a. s/p multiple PCIs - In 2008 he had a Taxus DES to the mild LAD, Endeavor DES to mid LCX and distal LCX. In January 2009 he had DES to distal LCX, mid LCX and proximal LCX. In November 2009 had BMS x 2 to the mid RCA. Cath 10/2011 with patent stents, noncardiac CP. LHC 01/2013: patent stents (noncardiac CP).  . DEGENERATIVE JOINT DISEASE 06/24/2009   Qualifier: Diagnosis of  By: [name], [name]   . Depression   . Depression with anxiety    Prior suicide attempt  . [place]urry DISEASE, LUMBAR 04/19/2010  . ERECTILE DYSFUNCTION, ORGANIC 05/30/2010  . Essential hypertension  06/24/2009   Qualifier: Diagnosis of  By: [name], [name]   . Fibromyalgia   . Fracture dislocation of ankle joint 09/02/2015  . Gait disorder 03/12/2013  . General weakness 07/14/2014  . GERD (gastroesophageal reflux disease) 09/08/2015  . Hand joint pain 06/10/2013  . Heart murmur   . Hepatitis C   . History of kidney stones   . Hyperlipidemia 07/15/2009   Qualifier: Diagnosis of  By: Aundra Dubin, MD, [place]    . HYPERLIPIDEMIA-MIXED 07/15/2009  . HYPERTENSION 06/24/2009  . Insomnia 10/04/2011  . Irregular heart beat   . Left hip pain 03/12/2013   Injected under ultrasound guidance on June 24, 2013   . Major depression 09/13/2015  . Myocardial infarction  ([place]) 2008  . Non-cardiac chest pain 10/2011, 01/2013  . Obesity   . Occult blood positive stool 10/17/2014  . Open ankle fracture 09/02/2015  . OSA (obstructive sleep apnea)    not using CPAP (09/30/2013)  . Pain of right thumb 04/03/2013  . Pneumonia   . PPD positive 04/08/2015  . Pre-ulcerative corn or callous 02/06/2013  . Restless legs   . Rotator cuff tear arthropathy of both shoulders 06/10/2013   History of bilateral shoulder cuff surgery for rotator cuff tears. Reports increase in pain 09/11/2015 during physical therapy of the left shoulder.   [name] SCIATICA, LEFT 04/19/2010   Qualifier: Diagnosis of  By: [name], [name]   . Spinal stenosis in cervical region 09/26/2013  . Spinal stenosis, lumbar region, with neurogenic claudication 09/26/2013  . Type II diabetes mellitus ([facility]) 2012   no meds in 09/2014.   [name] Uncontrolled type 2 DM with peripheral circulatory disorder ([place]) 10/04/2013  . URETHRAL STRICTURE 06/24/2009   self catheterizes.     Past Surgical History:  Procedure Laterality Date  . AMPUTATION Right 06/14/2016   Procedure: AMPUTATION BELOW KNEE;  Surgeon: Newt Minion, MD;  Location: [facility];  Service: Orthopedics;  Laterality: Right;  . ANKLE FUSION Right 04/15/2014   Procedure: Right Subtalar, Talonavicular Fusion;  Surgeon: Newt Minion, MD;  Location: [facility];  Service: Orthopedics;  Laterality: Right;  . ANKLE FUSION Right 04/18/2016   Right Ankle Tibiocalcaneal Fusion; Removal of deep retained hardware; application of wound vac/notes 04/18/2016  . ANKLE FUSION Right 04/18/2016   Procedure: Right Ankle Tibiocalcaneal Fusion;  Surgeon: Newt Minion, MD;  Location: [facility];  Service: Orthopedics;  Laterality: Right;  . ANTERIOR CERVICAL DECOMP/DISCECTOMY FUSION N/A 09/26/2013   Procedure: ANTERIOR CERVICAL DISCECTOMY FUSION C3-4, plate and screw fixation, allograft bone graft;  Surgeon: Jessy Oto, MD;  Location: [facility];  Service: Orthopedics;  Laterality: N/A;  . BACK SURGERY     . BELOW KNEE LEG AMPUTATION Right 06/14/2016  . CARDIAC CATHETERIZATION  X 1  . CARPAL TUNNEL RELEASE Bilateral   . COLONOSCOPY    . COLONOSCOPY N/A 10/22/2014   Procedure: COLONOSCOPY;  Surgeon: Lafayette Dragon, MD;  Location: [facility] ENDOSCOPY;  Service: Endoscopy;  Laterality: N/A;  . CORONARY ANGIOPLASTY WITH STENT PLACEMENT     "I have 9 stents"  . ESOPHAGOGASTRODUODENOSCOPY N/A 10/19/2014   Procedure: ESOPHAGOGASTRODUODENOSCOPY (EGD);  Surgeon: Jerene Bears, MD;  Location: [facility] ENDOSCOPY;  Service: Endoscopy;  Laterality: N/A;  . FUSION OF TALONAVICULAR JOINT Right 04/15/2014   dr duda  . HERNIA REPAIR     umbilical  . INGUINAL HERNIA REPAIR Right 05/11/2015   Procedure: LAPAROSCOPIC REPAIR RIGHT  INGUINAL HERNIA;  Surgeon: Greer Pickerel, MD;  Location:  [facility];  Service: General;  Laterality: Right;  . INSERTION OF MESH Right 05/11/2015   Procedure: INSERTION OF MESH;  Surgeon: Greer Pickerel, MD;  Location: [facility];  Service: General;  Laterality: Right;  . JOINT REPLACEMENT Bilateral    knees  . KNEE CARTILAGE SURGERY Right X 12   "~ 1/2 open; ~ 1/2 scopes"  . KNEE CARTILAGE SURGERY Left X 3   "3 scopes"  . LEFT HEART CATHETERIZATION WITH CORONARY ANGIOGRAM N/A 02/10/2013   Procedure: LEFT HEART CATHETERIZATION WITH CORONARY ANGIOGRAM;  Surgeon: Burnell Blanks, MD;  Location[facility] C[facility] LAB;  Service: Cardiovascular;  Laterality: N/A;  . LUMBAR LAMINECTOMY/DECOMPRESSION MICRODISCECTOMY N/A 01/27/2014   Procedure: CENTRAL LUMBAR LAMINECTOMY L4-5 AND L3-4;  Surgeon: Jessy Oto, MD;  Location: [facility];  Service: Orthopedics;  Laterality: N/A;  . ORIF ANKLE FRACTURE Right 09/02/2015   Procedure: OPEN REDUCTION INTERNAL FIXATION (ORIF) ANKLE FRACTURE;  Surgeon: Newt Minion, MD;  Location: [facility];  Service: Orthopedics;  Laterality: Right;  . PERIPHERALLY INSERTED CENTRAL CATHETER INSERTION  09/02/2015  . SHOULDER ARTHROSCOPY W/ ROTATOR CUFF REPAIR Bilateral    "3 on the right; 1 on the left"  .  SKIN SPLIT GRAFT Right 10/01/2015   Procedure: RIGHT ANKLE APPLY SKIN GRAFT SPLIT THICKNESS;  Surgeon: Newt Minion, MD;  Location: [facility];  Service: Orthopedics;  Laterality: Right;  . TONSILLECTOMY    . TOTAL KNEE ARTHROPLASTY Bilateral 2008  . UMBILICAL HERNIA REPAIR     UHR  . URETHRAL DILATION  X 4  . VASECTOMY      Family Psychiatric History: Reviewed.  Family History:  Family History  Problem Relation Age of Onset  . Depression Mother   . Heart disease Mother   . Hypertension Mother   . Cancer Mother        Breast  . Diabetes Father   . Heart disease Father        CABG  . Cancer Father        prostate and skin cancer  . Hypertension Father   . Hyperlipidemia Father   . Depression Brother        x 2  . Hypertension Brother        x2  . Heart disease Maternal Grandfather   . Early death Maternal Grandfather 77       heart attack  . Early death Paternal Grandfather   . Coronary artery disease Other   . Hypertension Other   . Depression Other     Social History:  Social History   Socioeconomic History  . Marital status: Divorced    Spouse name: Not on file  . Number of children: 2  . Years of education: Not on file  . Highest education level: Not on file  Occupational History  . Occupation: disabled since 2006 due to ortho. heart, psych    Employer: UNEMPLOYED  . Occupation: part time work as an Multimedia programmer, wrestling, and Holiday representative  Social Needs  . Financial resource strain: Not on file  . Food insecurity:    Worry: Not on file    Inability: Not on file  . Transportation needs:    Medical: Not on file    Non-medical: Not on file  Tobacco Use  . Smoking status: Former Smoker    Types: Cigars    Last attempt to quit: 08/28/2010    Years since quitting: 7.0  . Smokeless tobacco: Never Used  . Tobacco comment:  04/18/2016 "smoked 1 cigar/wk when I did smoke"  Substance and Sexual Activity  . Alcohol use: No     Alcohol/week: 0.0 oz    Comment: rarely  . Drug use: No  . Sexual activity: Not Currently  Lifestyle  . Physical activity:    Days per week: Not on file    Minutes per session: Not on file  . Stress: Not on file  Relationships  . Social connections:    Talks on phone: Not on file    Gets together: Not on file    Attends religious service: Not on file    Active member of club or organization: Not on file    Attends meetings of clubs or organizations: Not on file    Relationship status: Not on file  Other Topics Concern  . Not on file  Social History Narrative   Played semi-pro football, used steroids   Divorced moved here from [place], [place]   Patient states he has been on disability since his knee surgery.      03/05/2013 AHW "[name]" was born and grew up in [place], [place], and moved to [place], [place] at age 74. He has one sister and 3 brothers. He reports that his childhood was rough. His parents are deceased. He graduated from [employer], and attended one year of college. He is currently unemployed and on disability for multiple medical problems. He has been married 5 times. He has 2 sons. He currently lives alone. 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 He denies that he has any social support system. 03/05/2013 AHW          Allergies: No Known Allergies  Metabolic Disorder Labs: Recent Results (from the past 2160 hour(s))  HSV 2 antibody, IgG     Status: None   Collection Time: 07/13/17  3:35 PM  Result Value Ref Range   HSV 2 Glycoprotein G Ab, IgG <0.90 index    Comment:                           Index          Interpretation                           -----          --------------                           <0.90          Negative                           0.90-1.09      Equivocal                           >1.09          Positive . This assay utilizes recombinant type-specific antigens to differentiate HSV-1 from  HSV-2 infections. A positive result cannot distinguish between recent and past infection. If recent HSV infection is suspected but the results are negative or equivocal, the assay should be repeated in 4-6 weeks. The performance characteristics of the assay have not been established for pediatric populations, immunocompromised patients, or neonatal screening.   RPR     Status: None   Collection Time: 07/13/17  3:35  PM  Result Value Ref Range   RPR Ser Ql NON-REACTIVE NON-REACTI  HIV antibody     Status: None   Collection Time: 07/13/17  3:35 PM  Result Value Ref Range   HIV 1&2 Ab, 4th Generation NON-REACTIVE NON-REACTI    Comment: HIV-1 antigen and HIV-1/HIV-2 antibodies were not detected. There is no laboratory evidence of HIV infection. [name] PLEASE NOTE: This information has been disclosed to you from records whose confidentiality may be protected by state law.  If your state requires such protection, then the state law prohibits you from making any further disclosure of the information without the specific written consent of the person to whom it pertains, or as otherwise permitted by law. A general authorization for the release of medical or other information is NOT sufficient for this purpose. . For additional information please refer to http://education.[facility].com/faq/FAQ106 (This link is being provided for informational/ educational purposes only.) . [name] The performance of this assay has not been clinically validated in patients less than 87 years old. .   Hepatic function panel     Status: None   Collection Time: 07/13/17  3:35 PM  Result Value Ref Range   Total Bilirubin 0.6 0.2 - 1.2 mg/dL   Bilirubin, Direct 0.1 0.0 - 0.3 mg/dL   Alkaline Phosphatase 80 39 - 117 U/L   AST 18 0 - 37 U/L   ALT 15 0 - 53 U/L   Total Protein 7.8 6.0 - 8.3 g/dL   Albumin 4.7 3.5 - 5.2 g/dL  Basic metabolic panel     Status: Abnormal   Collection Time: 07/13/17  3:35 PM   Result Value Ref Range   Sodium 137 135 - 145 mEq/L   Potassium 4.1 3.5 - 5.1 mEq/L   Chloride 102 96 - 112 mEq/L   CO2 26 19 - 32 mEq/L   Glucose, Bld 104 (H) 70 - 99 mg/dL   BUN 19 6 - 23 mg/dL   Creatinine, Ser 1.04 0.40 - 1.50 mg/dL   Calcium 9.6 8.4 - 10.5 mg/dL   GFR 76.15 >60.00 mL/min  Lipid panel     Status: Abnormal   Collection Time: 07/13/17  3:35 PM  Result Value Ref Range   Cholesterol 112 0 - 200 mg/dL    Comment: ATP III Classification       Desirable:  < 200 mg/dL               Borderline High:  200 - 239 mg/dL          High:  > = 240 mg/dL   Triglycerides 77.0 0.0 - 149.0 mg/dL    Comment: Normal:  <150 mg/dLBorderline High:  150 - 199 mg/dL   HDL 38.10 (L) >39.00 mg/dL   VLDL 15.4 0.0 - 40.0 mg/dL   LDL Cholesterol 59 0 - 99 mg/dL   Total CHOL/HDL Ratio 3     Comment:                Men          Women1/2 Average Risk     3.4          3.3Average Risk          5.0          4.42X Average Risk          9.6          7.13X Average Risk          15.0  11.0                       NonHDL 74.06     Comment: NOTE:  Non-HDL goal should be 30 mg/dL higher than patient's LDL goal (i.e. LDL goal of < 70 mg/dL, would have non-HDL goal of < 100 mg/dL)  Hemoglobin A1c     Status: None   Collection Time: 07/13/17  3:35 PM  Result Value Ref Range   Hgb A1c MFr Bld 5.4 4.6 - 6.5 %    Comment: Glycemic Control Guidelines for People with Diabetes:Non Diabetic:  <6%Goal of Therapy: <7%Additional Action Suggested:  >8%    Lab Results  Component Value Date   HGBA1C 5.4 07/13/2017   MPG 105 04/18/2016   MPG 114 09/26/2013   No results found for: PROLACTIN Lab Results  Component Value Date   CHOL 112 07/13/2017   TRIG 77.0 07/13/2017   HDL 38.10 (L) 07/13/2017   CHOLHDL 3 07/13/2017   VLDL 15.4 07/13/2017   LDLCALC 59 07/13/2017   LDLCALC 88 03/01/2017   Lab Results  Component Value Date   TSH 3.00 03/01/2017   TSH 2.56 11/10/2015    Therapeutic Level Labs: No  results found for: LITHIUM Lab Results  Component Value Date   VALPROATE 46.3 (L) 07/14/2014   No components found for:  CBMZ  Current Medications: Current Outpatient Medications  Medication Sig Dispense Refill  . ARIPiprazole (ABILIFY) 5 MG tablet Take 1 tablet (5 mg total) by mouth daily. Take one tablet at bedtime 90 tablet 0  . aspirin 81 MG tablet Take 81 mg by mouth daily.    [name] atorvastatin (LIPITOR) 20 MG tablet Take 1 tablet (20 mg total) daily by mouth. 90 tablet 2  . benztropine (COGENTIN) 0.5 MG tablet Take 1 tablet (0.5 mg total) by mouth at bedtime. 90 tablet 0  . clonazePAM (KLONOPIN) 0.5 MG tablet Take 1 TABLET BY MOUTH at bedtime 30 tablet 5  . clonazePAM (KLONOPIN) 1 MG tablet Take 1 tablet (1 mg total) by mouth at bedtime. 30 tablet 3  . cyclobenzaprine (FLEXERIL) 5 MG tablet take 1 TABLET BY MOUTH THREE TIMES DAILY AS NEEDED for muscle spasms] 90 tablet 3  . FLUoxetine (PROZAC) 40 MG capsule TAKE 1 CAPSULE(40 MG) BY MOUTH DAILY 90 capsule 0  . ibuprofen (ADVIL,MOTRIN) 800 MG tablet Take 1 tablet (800 mg total) by mouth every 6 (six) hours as needed. 60 tablet 1  . mirabegron ER (MYRBETRIQ) 25 MG TB24 tablet Take 25 mg by mouth daily.    . Omega-3 Fatty Acids (FISH OIL) 1000 MG CAPS Take 1,000 mg by mouth daily.     . pantoprazole (PROTONIX) 40 MG tablet Take 1 TABLET BY MOUTH EVERY DAY 30 tablet 3  . prasugrel (EFFIENT) 5 MG TABS tablet Take 1 tablet (5 mg total) by mouth daily. 90 tablet 3  . tamsulosin (FLOMAX) 0.4 MG CAPS capsule Take 0.4 mg by mouth daily after breakfast.     . traMADol (ULTRAM) 50 MG tablet Take 1 tablet (50 mg total) by mouth every 6 (six) hours as needed for severe pain. 30 tablet 3  . traZODone (DESYREL) 150 MG tablet Take 1 tablet (150 mg total) by mouth at bedtime. 90 tablet 0  . triamcinolone (NASACORT AQ) 55 MCG/ACT AERO nasal inhaler Place 2 sprays into the nose daily. 1 Inhaler 12  . HYDROcodone-acetaminophen (NORCO/VICODIN) 5-325 MG tablet  Take 1 tablet by mouth every 12 (twelve) hours  as needed for moderate pain. (Patient not taking: Reported on 09/18/2017) 30 tablet 0   No current facility-administered medications for this visit.      Musculoskeletal: Strength & Muscle Tone: decreased Gait & Station: unsteady, right knee amputation Patient leans: see above  Psychiatric Specialty Exam: ROS  Blood pressure 135/83, pulse 67, height 6' 1"  (1.854 m), weight 296 lb (134.3 kg), SpO2 93 %.Body mass index is 39.05 kg/m.  General Appearance: Casual  Eye Contact:  Good  Speech:  Clear and Coherent  Volume:  Normal  Mood:  Euthymic  Affect:  Appropriate  Thought Process:  Goal Directed  Orientation:  Full (Time, Place, and Person)  Thought Content: Logical   Suicidal Thoughts:  No  Homicidal Thoughts:  No  Memory:  Immediate;   Good Recent;   Good Remote;   Good  Judgement:  Good  Insight:  Good  Psychomotor Activity:  Normal  Concentration:  Concentration: Fair and Attention Span: Fair  Recall:  Good  Fund of Knowledge: Good  Language: Good  Akathisia:  No  Handed:  Right  AIMS (if indicated): not done  Assets:  Communication Skills Desire for Improvement Housing Resilience Social Support  ADL's:  Intact  Cognition: WNL  Sleep:  Good   Screenings: AUDIT     Admission (Discharged) from OP Visit from 06/25/2012 in [facility]  Alcohol Use Disorder Identification Test Final Score (AUDIT)  1    PHQ2-9     Office Visit from 11/05/2015 in [facility] Patient Outreach from 09/15/2015 in [facility] Visit from 07/14/2015 in [facility] Office Visit from 03/11/2015 in [facility] from 09/09/2014 in [facility]  PHQ-2 Total Score  0  2  0  0  1  PHQ-9 Total Score  -  5  -  -  -       Assessment and Plan: Bipolar disorder type I.   Generalized anxiety disorder.  Patient doing better on his current medication.  Encourage healthy lifestyle.  I reviewed blood work results and collateral information from other providers.  Continue Prozac 40 mg daily, Abilify 5 mg daily, trazodone 150 mg at bedtime and Cogentin 0.5 mg at bedtime.  Patient also taking Klonopin from his primary care physician.  Discussed medication side effects and benefits.  Recommended to call us back if he has any question, concern if you feel worsening of the symptoms.  Follow-up in 3 months.   Kathlee Nations, MD 09/18/2017, 10:34 AM

## 2017-09-19 ENCOUNTER — Ambulatory Visit (INDEPENDENT_AMBULATORY_CARE_PROVIDER_SITE_OTHER): Payer: Medicare HMO | Admitting: Physician Assistant

## 2017-09-19 ENCOUNTER — Encounter: Payer: Self-pay | Admitting: Physician Assistant

## 2017-09-19 ENCOUNTER — Telehealth (INDEPENDENT_AMBULATORY_CARE_PROVIDER_SITE_OTHER): Payer: Self-pay

## 2017-09-19 VITALS — BP 112/64 | HR 54 | Ht 73.0 in | Wt 297.8 lb

## 2017-09-19 DIAGNOSIS — I10 Essential (primary) hypertension: Secondary | ICD-10-CM | POA: Diagnosis not present

## 2017-09-19 DIAGNOSIS — R6889 Other general symptoms and signs: Secondary | ICD-10-CM | POA: Diagnosis not present

## 2017-09-19 DIAGNOSIS — E785 Hyperlipidemia, unspecified: Secondary | ICD-10-CM

## 2017-09-19 DIAGNOSIS — I2510 Atherosclerotic heart disease of native coronary artery without angina pectoris: Secondary | ICD-10-CM

## 2017-09-19 DIAGNOSIS — Z01810 Encounter for preprocedural cardiovascular examination: Secondary | ICD-10-CM

## 2017-09-19 NOTE — Telephone Encounter (Signed)
[facility] called and stated that patient's clearance letter is in his chart as of today's date.

## 2017-09-19 NOTE — Patient Instructions (Addendum)
Medication Instructions: Your physician recommends that you continue on your current medications as directed. Please refer to the Current Medication list given to you today.  Labwork: None Ordered  Procedures/Testing: None Ordered  Follow-Up: Your physician recommends that you schedule a follow-up appointment in August with Dr. Saunders Revel    If you need a refill on your cardiac medications before your next appointment, please call your pharmacy.

## 2017-09-19 NOTE — Progress Notes (Signed)
Cardiology Office Note    Date:  09/19/2017   ID:  Travis Fortis Sr., DOB 1950/08/04, MRN [id number]  PCP:  [name], MD  Cardiologist:  Dr. Saunders Revel (Previously Dr. Aundra Dubin)  Chief Complaint: Pre op clearance  History of Present Illness:   Travis Kelley Sr. is a 67 y.o. male CAD s/p multiple stents and other past medical history as listed below presents for preop clearance for epidural injection (needs to call Travis [name] for 10 days).   Has had multiple PCIs, reports he never had an MI (just angina). 12/08 had Taxus DES to mLAD, Endeavor DES to [facility] and to dCFX. 11/09 had BMS x 2 to mid RCA. 1/09 had Xience DES to dCFX, mCFX, and pCFX. Travis [name] (3/11): EF 50%, apical hypokinesis, possible small inferoapical infarct with no ischemia. Low risk.  LHC (6/13): nonobstructive disease.  LHC (9/14): EF 55%, patent stents with nonobstructive disease, EF 55%.   Treated with Travis [name] (poor platelet inhibition with Plavix and he has 8 or 9 stents). Given hx of blood loss in setting of hip hematoma and gastritis, he is on Travis [name] 5 mg daily rather than 10 mg daily (years since last stent placement).   Last echocardiogram May 2016 showed LV function of 60 to 65%.  No wall motion abnormality.  Underwent Rt BKA in jan 2018 for charcot foot due to diabetes with infection.    He was doing well on cardiac standpoint when last seen by APP August 2018.  Here today for preop clearance.  He walks 2 miles every day on R prosthesis. half of his walking is up hill.  No limiting chest pain or shortness of breath.  Compliant with medication.  Denies orthopnea, PND, syncope, melena, blood in his stool, palpitation, dizziness or syncope.  He has chronic left lower edema for greater than 20 years.  Past Medical History:  Diagnosis Date  . ALLERGIC RHINITIS 06/24/2009  . Allergic rhinitis 06/24/2009   Qualifier: Diagnosis of  By: [name], [name]   . Anxiety 11/12/2011   Adequate for discharge   . Arthritis     "all my joints" (09/30/2013)  . Arthritis of foot, right, degenerative 04/15/2014  . Balance disorder 03/12/2013  . Benign neoplasm of cecum   . Benign neoplasm of descending colon   . CAD (coronary artery disease) 06/24/2009   5 stents placed in 2007    . Chronic anticoagulation   . Chronic pain syndrome 10/27/2009   of ankle, shoulders, low back.  sciatica.   . Closed fracture of right foot 10/17/2014  . CORONARY ARTERY DISEASE 06/24/2009   a. s/p multiple PCIs - In 2008 he had a Taxus DES to the mild LAD, Endeavor DES to mid LCX and distal LCX. In January 2009 he had DES to distal LCX, mid LCX and proximal LCX. In November 2009 had BMS x 2 to the mid RCA. Cath 10/2011 with patent stents, noncardiac CP. LHC 01/2013: patent stents (noncardiac CP).  . DEGENERATIVE JOINT DISEASE 06/24/2009   Qualifier: Diagnosis of  By: [name], [name]   . Depression   . Depression with anxiety    Prior suicide attempt  . [place] DISEASE, LUMBAR 04/19/2010  . ERECTILE DYSFUNCTION, ORGANIC 05/30/2010  . Essential hypertension 06/24/2009   Qualifier: Diagnosis of  By: [name], [name]   . Fibromyalgia   . Fracture dislocation of ankle joint 09/02/2015  . Gait disorder 03/12/2013  . General weakness 07/14/2014  . GERD (gastroesophageal reflux  disease) 09/08/2015  . Hand joint pain 06/10/2013  . Heart murmur   . Hepatitis C   . History of kidney stones   . Hyperlipidemia 07/15/2009   Qualifier: Diagnosis of  By: Aundra Dubin, MD, [place]    . HYPERLIPIDEMIA-MIXED 07/15/2009  . HYPERTENSION 06/24/2009  . Insomnia 10/04/2011  . Irregular heart beat   . Left hip pain 03/12/2013   Injected under ultrasound guidance on June 24, 2013   . Major depression 09/13/2015  . Myocardial infarction (Travis [name]) 2008  . Non-cardiac chest pain 10/2011, 01/2013  . Obesity   . Occult blood positive stool 10/17/2014  . Open ankle fracture 09/02/2015  . OSA (obstructive sleep apnea)    not using CPAP (09/30/2013)  . Pain of right thumb 04/03/2013  .  Pneumonia   . PPD positive 04/08/2015  . Pre-ulcerative corn or callous 02/06/2013  . Restless legs   . Rotator cuff tear arthropathy of both shoulders 06/10/2013   History of bilateral shoulder cuff surgery for rotator cuff tears. Reports increase in pain 09/11/2015 during physical therapy of the left shoulder.   [name] SCIATICA, LEFT 04/19/2010   Qualifier: Diagnosis of  By: [name], [name]   . Spinal stenosis in cervical region 09/26/2013  . Spinal stenosis, lumbar region, with neurogenic claudication 09/26/2013  . Type II diabetes mellitus ([employer]) 2012   no meds in 09/2014.   [name] Uncontrolled type 2 DM with peripheral circulatory disorder (Travis [name]) 10/04/2013  . URETHRAL STRICTURE 06/24/2009   self catheterizes.     Past Surgical History:  Procedure Laterality Date  . AMPUTATION Right 06/14/2016   Procedure: AMPUTATION BELOW KNEE;  Surgeon: Newt Minion, MD;  Location: [place];  Service: Orthopedics;  Laterality: Right;  . ANKLE FUSION Right 04/15/2014   Procedure: Right Subtalar, Talonavicular Fusion;  Surgeon: Newt Minion, MD;  Location: [place];  Service: Orthopedics;  Laterality: Right;  . ANKLE FUSION Right 04/18/2016   Right Ankle Tibiocalcaneal Fusion; Removal of deep retained hardware; application of wound vac/notes 04/18/2016  . ANKLE FUSION Right 04/18/2016   Procedure: Right Ankle Tibiocalcaneal Fusion;  Surgeon: Newt Minion, MD;  Location: [place];  Service: Orthopedics;  Laterality: Right;  . ANTERIOR CERVICAL DECOMP/DISCECTOMY FUSION N/A 09/26/2013   Procedure: ANTERIOR CERVICAL DISCECTOMY FUSION C3-4, plate and screw fixation, allograft bone graft;  Surgeon: Jessy Oto, MD;  Location: [facility];  Service: Orthopedics;  Laterality: N/A;  . BACK SURGERY    . BELOW KNEE LEG AMPUTATION Right 06/14/2016  . CARDIAC CATHETERIZATION  X 1  . CARPAL TUNNEL RELEASE Bilateral   . COLONOSCOPY    . COLONOSCOPY N/A 10/22/2014   Procedure: COLONOSCOPY;  Surgeon: Lafayette Dragon, MD;  Location: [facility] ENDOSCOPY;   Service: Endoscopy;  Laterality: N/A;  . CORONARY ANGIOPLASTY WITH STENT PLACEMENT     "I have 9 stents"  . ESOPHAGOGASTRODUODENOSCOPY N/A 10/19/2014   Procedure: ESOPHAGOGASTRODUODENOSCOPY (EGD);  Surgeon: Jerene Bears, MD;  Location: [facility] ENDOSCOPY;  Service: Endoscopy;  Laterality: N/A;  . FUSION OF TALONAVICULAR JOINT Right 04/15/2014   dr duda  . HERNIA REPAIR     umbilical  . INGUINAL HERNIA REPAIR Right 05/11/2015   Procedure: LAPAROSCOPIC REPAIR RIGHT  INGUINAL HERNIA;  Surgeon: Greer Pickerel, MD;  Location: [place];  Service: General;  Laterality: Right;  . INSERTION OF MESH Right 05/11/2015   Procedure: INSERTION OF MESH;  Surgeon: Greer Pickerel, MD;  Location: [place];  Service: General;  Laterality: Right;  . JOINT REPLACEMENT  Bilateral    knees  . KNEE CARTILAGE SURGERY Right X 12   "~ 1/2 open; ~ 1/2 scopes"  . KNEE CARTILAGE SURGERY Left X 3   "3 scopes"  . LEFT HEART CATHETERIZATION WITH CORONARY ANGIOGRAM N/A 02/10/2013   Procedure: LEFT HEART CATHETERIZATION WITH CORONARY ANGIOGRAM;  Surgeon: Burnell Blanks, MD;  Location: [facility] CATH LAB;  Service: Cardiovascular;  Laterality: N/A;  . LUMBAR LAMINECTOMY/DECOMPRESSION MICRODISCECTOMY N/A 01/27/2014   Procedure: CENTRAL LUMBAR LAMINECTOMY L4-5 AND L3-4;  Surgeon: Jessy Oto, MD;  Location: [place];  Service: Orthopedics;  Laterality: N/A;  . ORIF ANKLE FRACTURE Right 09/02/2015   Procedure: OPEN REDUCTION INTERNAL FIXATION (ORIF) ANKLE FRACTURE;  Surgeon: Newt Minion, MD;  Location: [place];  Service: Orthopedics;  Laterality: Right;  . PERIPHERALLY INSERTED CENTRAL CATHETER INSERTION  09/02/2015  . SHOULDER ARTHROSCOPY W/ ROTATOR CUFF REPAIR Bilateral    "3 on the right; 1 on the left"  . SKIN SPLIT GRAFT Right 10/01/2015   Procedure: RIGHT ANKLE APPLY SKIN GRAFT SPLIT THICKNESS;  Surgeon: Newt Minion, MD;  Location: [place];  Service: Orthopedics;  Laterality: Right;  . TONSILLECTOMY    . TOTAL KNEE ARTHROPLASTY Bilateral 2008    . UMBILICAL HERNIA REPAIR     UHR  . URETHRAL DILATION  X 4  . VASECTOMY      Current Medications: Prior to Admission medications   Medication Sig Start Date End Date Taking? Authorizing Provider  ARIPiprazole (ABILIFY) 5 MG tablet Take 1 tablet (5 mg total) by mouth daily. Take one tablet at bedtime 09/18/17   Arfeen, Arlyce Harman, MD  aspirin 81 MG tablet Take 81 mg by mouth daily.    [provider]  atorvastatin (LIPITOR) 20 MG tablet Take 1 tablet (20 mg total) daily by mouth. 03/28/17   [name], MD  benztropine (COGENTIN) 0.5 MG tablet Take 1 tablet (0.5 mg total) by mouth at bedtime. 09/18/17 09/18/18  Arfeen, Arlyce Harman, MD  clonazePAM (KLONOPIN) 0.5 MG tablet Take 1 TABLET BY MOUTH at bedtime 07/09/17   [name], MD  clonazePAM (KLONOPIN) 1 MG tablet Take 1 tablet (1 mg total) by mouth at bedtime. 01/19/17   [name], MD  cyclobenzaprine (FLEXERIL) 5 MG tablet take 1 TABLET BY MOUTH THREE TIMES DAILY AS NEEDED for muscle spasms] 07/13/17   [name], MD  FLUoxetine (PROZAC) 40 MG capsule TAKE 1 CAPSULE(40 MG) BY MOUTH DAILY 09/18/17   Arfeen, Arlyce Harman, MD  ibuprofen (ADVIL,MOTRIN) 800 MG tablet Take 1 tablet (800 mg total) by mouth every 6 (six) hours as needed. 08/17/17   [name], MD  mirabegron ER (MYRBETRIQ) 25 MG TB24 tablet Take 25 mg by mouth daily.    [provider]  Omega-3 Fatty Acids (FISH OIL) 1000 MG CAPS Take 1,000 mg by mouth daily.     [provider]  pantoprazole (PROTONIX) 40 MG tablet Take 1 TABLET BY MOUTH EVERY DAY 08/14/17   [name], MD  prasugrel (Travis [name]) 5 MG TABS tablet Take 1 tablet (5 mg total) by mouth daily. 01/09/17   Isaiah Serge, NP  tamsulosin (FLOMAX) 0.4 MG CAPS capsule Take 0.4 mg by mouth daily after breakfast.  09/07/15   [provider]  traMADol (ULTRAM) 50 MG tablet Take 1 tablet (50 mg total) by mouth every 6 (six) hours as needed for severe pain. 09/10/17   Jessy Oto, MD  traZODone  (DESYREL) 150 MG  tablet Take 1 tablet (150 mg total) by mouth at bedtime. 09/18/17   Arfeen, Arlyce Harman, MD  triamcinolone (NASACORT AQ) 55 MCG/ACT AERO nasal inhaler Place 2 sprays into the nose daily. 10/20/16   [name], MD    Allergies:   Patient has no known allergies.   Social History   Socioeconomic History  . Marital status: Divorced    Spouse name: Not on file  . Number of children: 2  . Years of education: Not on file  . Highest education level: Not on file  Occupational History  . Occupation: disabled since 2006 due to ortho. heart, psych    Employer: UNEMPLOYED  . Occupation: part time work as an Multimedia programmer, wrestling, and Holiday representative  Social Needs  . Financial resource strain: Not on file  . Food insecurity:    Worry: Not on file    Inability: Not on file  . Transportation needs:    Medical: Not on file    Non-medical: Not on file  Tobacco Use  . Smoking status: Former Smoker    Types: Cigars    Last attempt to quit: 08/28/2010    Years since quitting: 7.0  . Smokeless tobacco: Never Used  . Tobacco comment: 04/18/2016 "smoked 1 cigar/wk when I did smoke"  Substance and Sexual Activity  . Alcohol use: No    Alcohol/week: 0.0 oz    Comment: rarely  . Drug use: No  . Sexual activity: Not Currently  Lifestyle  . Physical activity:    Days per week: Not on file    Minutes per session: Not on file  . Stress: Not on file  Relationships  . Social connections:    Talks on phone: Not on file    Gets together: Not on file    Attends religious service: Not on file    Active member of club or organization: Not on file    Attends meetings of clubs or organizations: Not on file    Relationship status: Not on file  Other Topics Concern  . Not on file  Social History Narrative   Played semi-pro football, used steroids   Divorced moved here from [place], [place]   Patient states he has been on disability since his knee surgery.       03/05/2013 AHW "[name]" was born and grew up in [place], [place], and moved to [place], [place] at age 85. He has one sister and 3 brothers. He reports that his childhood was rough. His parents are deceased. He graduated from [employer], and attended one year of college. He is currently unemployed and on disability for multiple medical problems. He has been married 5 times. He has 2 sons. He currently lives alone. 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 He denies that he has any social support system. 03/05/2013 AHW           Family History:  The patient's family history includes Cancer in his father and mother; Coronary artery disease in his other; Depression in his brother, mother, and other; Diabetes in his father; Early death in his paternal grandfather; Early death (age of onset: 50) in his maternal grandfather; Heart disease in his father, maternal grandfather, and mother; Hyperlipidemia in his father; Hypertension in his brother, father, mother, and other.   ROS:   Please see the history of present illness.    ROS All other systems reviewed and are negative.   PHYSICAL EXAM:  VS:  BP 112/64   Pulse (!) 54   Ht 6' 1"  (1.854 m)   Wt 297 lb 12.8 oz (135.1 kg)   SpO2 96%   BMI 39.29 kg/m    GEN: Well nourished, well developed, in no acute distress  HEENT: normal  Neck: no JVD, carotid bruits, or masses Cardiac: RRR; no murmurs, rubs, or gallops,no edema  Respiratory:  clear to auscultation bilaterally, normal work of breathing GI: soft, nontender, nondistended, + BS MS: s/p R Leg prosthesis Skin: warm and dry, no rash Neuro:  Alert and Oriented x 3, Strength and sensation are intact Psych: euthymic mood, full affect  Wt Readings from Last 3 Encounters:  09/19/17 297 lb 12.8 oz (135.1 kg)  09/10/17 298 lb (135.2 kg)  08/16/17 (!) 306 lb (138.8 kg)      Studies/Labs Reviewed:   EKG:  EKG is ordered today.  The ekg ordered today  demonstrates sinus bradycardia 54 bpm Recent Labs: 03/01/2017: Hemoglobin 13.2; Platelets 239.0; TSH 3.00 07/13/2017: ALT 15; BUN 19; Creatinine, Ser 1.04; Potassium 4.1; Sodium 137   Lipid Panel    Component Value Date/Time   CHOL 112 07/13/2017 1535   TRIG 77.0 07/13/2017 1535   HDL 38.10 (L) 07/13/2017 1535   CHOLHDL 3 07/13/2017 1535   VLDL 15.4 07/13/2017 1535   LDLCALC 59 07/13/2017 1535    Additional studies/ records that were reviewed today include:   As summarized above    ASSESSMENT & PLAN:    1. CAD status post multiple stent -No angina or dyspnea.  He is walking 2 miles every day without any limitations and half of his walking is uphill.  Continue aspirin 81 mg and Travis [name] 5 mg daily.  (poor platelet inhibition with Plavix and he has 8 or 9 stents)  2.  Preoperative clearance -Excellent walking schedule without any limitation.  Okay to go Travis [name] 10 days prior to his injection. Given past medical history and time since last visit, based on ACC/AHA guidelines, Zaccheus Edmister Sr. would be at acceptable risk for the planned procedure without further cardiovascular testing.   3. HLD - LDL 59. -Continue Lipitor and fish oil.     Medication Adjustments/Labs and Tests Ordered: Current medicines are reviewed at length with the patient today.  Concerns regarding medicines are outlined above.  Medication changes, Labs and Tests ordered today are listed in the Patient Instructions below. There are no Patient Instructions on file for this visit.   Jarrett Soho, [place]  09/19/2017 8:35 AM    [facility] [address], [place], [place]  [address] Phone: [phone]; Fax: [phone]

## 2017-09-19 NOTE — Telephone Encounter (Signed)
For ESI with [facility], see clearance in chart.

## 2017-09-20 NOTE — Telephone Encounter (Signed)
Pt is scheduled fo 10/08/17. Advised pt to hold BT 10 prior, advised pt to have a driver for appt.

## 2017-09-20 NOTE — Addendum Note (Signed)
Addended by: Kathie Dike on: 09/20/2017 09:32 AM   Modules accepted: Orders

## 2017-09-24 DIAGNOSIS — R6889 Other general symptoms and signs: Secondary | ICD-10-CM | POA: Diagnosis not present

## 2017-10-01 DIAGNOSIS — R6889 Other general symptoms and signs: Secondary | ICD-10-CM | POA: Diagnosis not present

## 2017-10-02 ENCOUNTER — Telehealth: Payer: Self-pay | Admitting: Internal Medicine

## 2017-10-02 NOTE — Telephone Encounter (Signed)
Pt requesting for refills of Klonopin to be sent to [facility]. Prescription was previously sent to [facility] on 07/09/17 #30 with 5 refills.

## 2017-10-02 NOTE — Telephone Encounter (Signed)
Copied from [place] [id number]. Topic: Quick Communication - Rx Refill/Question >> Oct 02, 2017  3:46 PM Selinda Flavin B, NT wrote: Medication: clonazePAM (KLONOPIN) 0.5 MG tablet Has the patient contacted their pharmacy? Yes.   (Agent: If no, request that the patient contact the pharmacy for the refill.) Preferred Pharmacy (with phone number or street name): [facility], [place], [address] Agent: Please be advised that RX refills may take up to 3 business days. We ask that you follow-up with your pharmacy.

## 2017-10-03 MED ORDER — CLONAZEPAM 0.5 MG PO TABS: 0.5000 mg | ORAL_TABLET | Freq: Every day | ORAL | 5 refills | Status: DC

## 2017-10-03 NOTE — Telephone Encounter (Signed)
Notified pt rx has been sent to new pharmacy.Marland KitchenJohny [name]

## 2017-10-03 NOTE — Telephone Encounter (Signed)
Done erx 

## 2017-10-03 NOTE — Telephone Encounter (Signed)
[facility] has closed. Can we send the remaining refills from 2/18 to new pharmacy ([facility]).Marland KitchenJohny Chess

## 2017-10-08 ENCOUNTER — Ambulatory Visit (INDEPENDENT_AMBULATORY_CARE_PROVIDER_SITE_OTHER): Payer: Medicare HMO | Admitting: Physical Medicine and Rehabilitation

## 2017-10-08 ENCOUNTER — Encounter (INDEPENDENT_AMBULATORY_CARE_PROVIDER_SITE_OTHER): Payer: Self-pay | Admitting: Physical Medicine and Rehabilitation

## 2017-10-08 ENCOUNTER — Ambulatory Visit (INDEPENDENT_AMBULATORY_CARE_PROVIDER_SITE_OTHER): Payer: Self-pay

## 2017-10-08 VITALS — BP 141/74

## 2017-10-08 DIAGNOSIS — M5416 Radiculopathy, lumbar region: Secondary | ICD-10-CM

## 2017-10-08 DIAGNOSIS — R6889 Other general symptoms and signs: Secondary | ICD-10-CM | POA: Diagnosis not present

## 2017-10-08 MED ORDER — DEXAMETHASONE SODIUM PHOSPHATE 10 MG/ML IJ SOLN
15.0000 mg | Freq: Once | INTRAMUSCULAR | Status: AC
  Administered 2017-10-08: 15 mg

## 2017-10-08 NOTE — Progress Notes (Signed)
   Numeric Pain Rating Scale and Functional Assessment Average Pain 6   In the last MONTH (on 0-10 scale) has pain interfered with the following?  1. General activity like being  able to carry out your everyday physical activities such as walking, climbing stairs, carrying groceries, or moving a chair?  Rating(2)   +Driver, -BT (stopped effient 10 days ago) , -Dye Allergies.

## 2017-10-08 NOTE — Patient Instructions (Signed)

## 2017-10-09 ENCOUNTER — Encounter: Payer: Self-pay | Admitting: Internal Medicine

## 2017-10-09 ENCOUNTER — Ambulatory Visit (INDEPENDENT_AMBULATORY_CARE_PROVIDER_SITE_OTHER): Payer: Medicare HMO | Admitting: Internal Medicine

## 2017-10-09 DIAGNOSIS — R739 Hyperglycemia, unspecified: Secondary | ICD-10-CM

## 2017-10-09 DIAGNOSIS — I10 Essential (primary) hypertension: Secondary | ICD-10-CM

## 2017-10-09 DIAGNOSIS — R6889 Other general symptoms and signs: Secondary | ICD-10-CM | POA: Diagnosis not present

## 2017-10-09 NOTE — Progress Notes (Signed)
Subjective:    Patient ID: Travis Fortis Sr., male    DOB: 1950-06-03, 67 y.o.   MRN: [id number]  HPI    Here to f/u; overall doing ok,  Pt denies chest pain, increasing sob or doe, wheezing, orthopnea, PND, increased LE swelling, palpitations, dizziness or syncope.  Pt denies new neurological symptoms such as new headache, or facial or extremity weakness or numbness.  Pt denies polydipsia, polyuria, or low sugar episode.  Pt states overall good compliance with meds, mostly trying to follow appropriate diet, with wt overall stable,  but little exercise however. No longer taking metformin.  Trying to lose wt, asks for bariatric surgical eval. Pt continues to have recurring LBP without change in severity, bowel or bladder change, fever, wt loss,  worsening LE pain/numbness/weakness, gait change or falls.  Mar 2019 with signifiant lumbar DDD/DJD Past Medical History:  Diagnosis Date  . ALLERGIC RHINITIS 06/24/2009  . Allergic rhinitis 06/24/2009   Qualifier: Diagnosis of  By: [name], [name]   . Anxiety 11/12/2011   Adequate for discharge   . Arthritis    "all my joints" (09/30/2013)  . Arthritis of foot, right, degenerative 04/15/2014  . Balance disorder 03/12/2013  . Benign neoplasm of cecum   . Benign neoplasm of descending colon   . CAD (coronary artery disease) 06/24/2009   5 stents placed in 2007    . Chronic anticoagulation   . Chronic pain syndrome 10/27/2009   of ankle, shoulders, low back.  sciatica.   . Closed fracture of right foot 10/17/2014  . CORONARY ARTERY DISEASE 06/24/2009   a. s/p multiple PCIs - In 2008 he had a Taxus DES to the mild LAD, Endeavor DES to mid LCX and distal LCX. In January 2009 he had DES to distal LCX, mid LCX and proximal LCX. In November 2009 had BMS x 2 to the mid RCA. Cath 10/2011 with patent stents, noncardiac CP. LHC 01/2013: patent stents (noncardiac CP).  . DEGENERATIVE JOINT DISEASE 06/24/2009   Qualifier: Diagnosis of  By: [name], [name]   . Depression    . Depression with anxiety    Prior suicide attempt  . [place] DISEASE, LUMBAR 04/19/2010  . ERECTILE DYSFUNCTION, ORGANIC 05/30/2010  . Essential hypertension 06/24/2009   Qualifier: Diagnosis of  By: [name], [name]   . Fibromyalgia   . Fracture dislocation of ankle joint 09/02/2015  . Gait disorder 03/12/2013  . General weakness 07/14/2014  . GERD (gastroesophageal reflux disease) 09/08/2015  . Hand joint pain 06/10/2013  . Heart murmur   . Hepatitis C   . History of kidney stones   . Hyperlipidemia 07/15/2009   Qualifier: Diagnosis of  By: Aundra Dubin, MD, [place]    . HYPERLIPIDEMIA-MIXED 07/15/2009  . HYPERTENSION 06/24/2009  . Insomnia 10/04/2011  . Irregular heart beat   . Left hip pain 03/12/2013   Injected under ultrasound guidance on June 24, 2013   . Major depression 09/13/2015  . Myocardial infarction ([place]) 2008  . Non-cardiac chest pain 10/2011, 01/2013  . Obesity   . Occult blood positive stool 10/17/2014  . Open ankle fracture 09/02/2015  . OSA (obstructive sleep apnea)    not using CPAP (09/30/2013)  . Pain of right thumb 04/03/2013  . Pneumonia   . PPD positive 04/08/2015  . Pre-ulcerative corn or callous 02/06/2013  . Restless legs   . Rotator cuff tear arthropathy of both shoulders 06/10/2013   History of bilateral shoulder cuff surgery for rotator  cuff tears. Reports increase in pain 09/11/2015 during physical therapy of the left shoulder.   [name] SCIATICA, LEFT 04/19/2010   Qualifier: Diagnosis of  By: [name], [name]   . Spinal stenosis in cervical region 09/26/2013  . Spinal stenosis, lumbar region, with neurogenic claudication 09/26/2013  . Type II diabetes mellitus ([place]) 2012   no meds in 09/2014.   [name] Uncontrolled type 2 DM with peripheral circulatory disorder ([place]) 10/04/2013  . URETHRAL STRICTURE 06/24/2009   self catheterizes.    Past Surgical History:  Procedure Laterality Date  . AMPUTATION Right 06/14/2016   Procedure: AMPUTATION BELOW KNEE;  Surgeon: Newt Minion, MD;   Location: [facility];  Service: Orthopedics;  Laterality: Right;  . ANKLE FUSION Right 04/15/2014   Procedure: Right Subtalar, Talonavicular Fusion;  Surgeon: Newt Minion, MD;  Location: [place] [facility];  Service: Orthopedics;  Laterality: Right;  . ANKLE FUSION Right 04/18/2016   Right Ankle Tibiocalcaneal Fusion; Removal of deep retained hardware; application of wound vac/notes 04/18/2016  . ANKLE FUSION Right 04/18/2016   Procedure: Right Ankle Tibiocalcaneal Fusion;  Surgeon: Newt Minion, MD;  Location: [facility];  Service: Orthopedics;  Laterality: Right;  . ANTERIOR CERVICAL DECOMP/DISCECTOMY FUSION N/A 09/26/2013   Procedure: ANTERIOR CERVICAL DISCECTOMY FUSION C3-4, plate and screw fixation, allograft bone graft;  Surgeon: Jessy Oto, MD;  Location: [place];  Service: Orthopedics;  Laterality: N/A;  . BACK SURGERY    . BELOW KNEE LEG AMPUTATION Right 06/14/2016  . CARDIAC CATHETERIZATION  X 1  . CARPAL TUNNEL RELEASE Bilateral   . COLONOSCOPY    . COLONOSCOPY N/A 10/22/2014   Procedure: COLONOSCOPY;  Surgeon: Lafayette Dragon, MD;  Location: [facility] ENDOSCOPY;  Service: Endoscopy;  Laterality: N/A;  . CORONARY ANGIOPLASTY WITH STENT PLACEMENT     "I have 9 stents"  . ESOPHAGOGASTRODUODENOSCOPY N/A 10/19/2014   Procedure: ESOPHAGOGASTRODUODENOSCOPY (EGD);  Surgeon: Jerene Bears, MD;  Location: [facility] ENDOSCOPY;  Service: Endoscopy;  Laterality: N/A;  . FUSION OF TALONAVICULAR JOINT Right 04/15/2014   dr duda  . HERNIA REPAIR     umbilical  . INGUINAL HERNIA REPAIR Right 05/11/2015   Procedure: LAPAROSCOPIC REPAIR RIGHT  INGUINAL HERNIA;  Surgeon: Greer Pickerel, MD;  Location: [place];  Service: General;  Laterality: Right;  . INSERTION OF MESH Right 05/11/2015   Procedure: INSERTION OF MESH;  Surgeon: Greer Pickerel, MD;  Location: [place];  Service: General;  Laterality: Right;  . JOINT REPLACEMENT Bilateral    knees  . KNEE CARTILAGE SURGERY Right X 12   "~ 1/2 open; ~ 1/2 scopes"  . KNEE CARTILAGE SURGERY Left X 3    "3 scopes"  . LEFT HEART CATHETERIZATION WITH CORONARY ANGIOGRAM N/A 02/10/2013   Procedure: LEFT HEART CATHETERIZATION WITH CORONARY ANGIOGRAM;  Surgeon: Burnell Blanks, MD;  Location: [facility] CATH LAB;  Service: Cardiovascular;  Laterality: N/A;  . LUMBAR LAMINECTOMY/DECOMPRESSION MICRODISCECTOMY N/A 01/27/2014   Procedure: CENTRAL LUMBAR LAMINECTOMY L4-5 AND L3-4;  Surgeon: Jessy Oto, MD;  Location: [place];  Service: Orthopedics;  Laterality: N/A;  . ORIF ANKLE FRACTURE Right 09/02/2015   Procedure: OPEN REDUCTION INTERNAL FIXATION (ORIF) ANKLE FRACTURE;  Surgeon: Newt Minion, MD;  Location: [place];  Service: Orthopedics;  Laterality: Right;  . PERIPHERALLY INSERTED CENTRAL CATHETER INSERTION  09/02/2015  . SHOULDER ARTHROSCOPY W/ ROTATOR CUFF REPAIR Bilateral    "3 on the right; 1 on the left"  . SKIN SPLIT GRAFT Right 10/01/2015   Procedure:  RIGHT ANKLE APPLY SKIN GRAFT SPLIT THICKNESS;  Surgeon: Newt Minion, MD;  Location: [place];  Service: Orthopedics;  Laterality: Right;  . TONSILLECTOMY    . TOTAL KNEE ARTHROPLASTY Bilateral 2008  . UMBILICAL HERNIA REPAIR     UHR  . URETHRAL DILATION  X 4  . VASECTOMY      reports that he quit smoking about 7 years ago. His smoking use included cigars. He has never used smokeless tobacco. He reports that he does not drink alcohol or use drugs. family history includes Cancer in his father and mother; Coronary artery disease in his other; Depression in his brother, mother, and other; Diabetes in his father; Early death in his paternal grandfather; Early death (age of onset: 18) in his maternal grandfather; Heart disease in his father, maternal grandfather, and mother; Hyperlipidemia in his father; Hypertension in his brother, father, mother, and other. No Known Allergies Current Outpatient Medications on File Prior to Visit  Medication Sig Dispense Refill  . ARIPiprazole (ABILIFY) 5 MG tablet Take 1 tablet (5 mg total) by mouth daily. Take one  tablet at bedtime 90 tablet 0  . aspirin 81 MG tablet Take 81 mg by mouth daily.    [name] atorvastatin (LIPITOR) 20 MG tablet Take 1 tablet (20 mg total) daily by mouth. 90 tablet 2  . benztropine (COGENTIN) 0.5 MG tablet Take 1 tablet (0.5 mg total) by mouth at bedtime. 90 tablet 0  . clonazePAM (KLONOPIN) 0.5 MG tablet Take 1 tablet (0.5 mg total) by mouth at bedtime. 30 tablet 5  . cyclobenzaprine (FLEXERIL) 5 MG tablet take 1 TABLET BY MOUTH THREE TIMES DAILY AS NEEDED for muscle spasms] 90 tablet 3  . FLUoxetine (PROZAC) 40 MG capsule TAKE 1 CAPSULE(40 MG) BY MOUTH DAILY 90 capsule 0  . ibuprofen (ADVIL,MOTRIN) 800 MG tablet Take 1 tablet (800 mg total) by mouth every 6 (six) hours as needed. 60 tablet 1  . mirabegron ER (MYRBETRIQ) 25 MG TB24 tablet Take 25 mg by mouth daily.    . Omega-3 Fatty Acids (FISH OIL) 1000 MG CAPS Take 1,000 mg by mouth daily.     . pantoprazole (PROTONIX) 40 MG tablet Take 1 TABLET BY MOUTH EVERY DAY 30 tablet 3  . prasugrel (EFFIENT) 5 MG TABS tablet Take 1 tablet (5 mg total) by mouth daily. 90 tablet 3  . tamsulosin (FLOMAX) 0.4 MG CAPS capsule Take 0.4 mg by mouth daily after breakfast.     . traMADol (ULTRAM) 50 MG tablet Take 1 tablet (50 mg total) by mouth every 6 (six) hours as needed for severe pain. 30 tablet 3  . traZODone (DESYREL) 150 MG tablet Take 1 tablet (150 mg total) by mouth at bedtime. 90 tablet 0  . triamcinolone (NASACORT AQ) 55 MCG/ACT AERO nasal inhaler Place 2 sprays into the nose daily. 1 Inhaler 12   No current facility-administered medications on file prior to visit.    Review of Systems All otherwise neg per pt    Objective:   Physical Exam BP 122/78   Pulse 69   Temp 98.4 F (36.9 C) (Oral)   Ht 6' (1.829 m)   Wt (!) 304 lb (137.9 kg)   SpO2 96%   BMI 41.23 kg/m  VS noted, obese Constitutional: Pt appears in NAD HENT: Head: NCAT.  Right Ear: External ear normal.  Left Ear: External ear normal.  Eyes: . Pupils are  equal, round, and reactive to light. Conjunctivae and EOM are normal Nose:  without d/c or deformity Neck: Neck supple. Gross normal ROM Cardiovascular: Normal rate and regular rhythm.   Pulmonary/Chest: Effort normal and breath sounds without rales or wheezing.  S/p partial RLE amputation Neurological: Pt is alert. At baseline orientation, motor grossly intact Skin: Skin is warm. No rashes, other new lesions, no LE edema Psychiatric: Pt behavior is normal without agitation  No other exam findings    Assessment & Plan:

## 2017-10-09 NOTE — Assessment & Plan Note (Signed)
Ok for bariatric eval, but may not be ideal candidate due to co morbids

## 2017-10-09 NOTE — Assessment & Plan Note (Signed)
Lab Results  Component Value Date   HGBA1C 5.4 07/13/2017  stable overall by history and exam, recent data reviewed with pt, and pt to continue medical treatment as before,  to f/u any worsening symptoms or concerns

## 2017-10-09 NOTE — Patient Instructions (Signed)
You will be contacted regarding the referral for: Bariatric Surgury evaluatoin  Please continue all other medications as before, and refills have been done if requested.  Please have the pharmacy call with any other refills you may need.  Please continue your efforts at being more active, low cholesterol diet, and weight control.  Please keep your appointments with your specialists as you may have planned

## 2017-10-09 NOTE — Assessment & Plan Note (Signed)
stable overall by history and exam, recent data reviewed with pt, and pt to continue medical treatment as before,  to f/u any worsening symptoms or concerns BP Readings from Last 3 Encounters:  10/09/17 122/78  10/08/17 (!) 141/74  09/19/17 112/64

## 2017-10-10 ENCOUNTER — Other Ambulatory Visit: Payer: Self-pay | Admitting: Internal Medicine

## 2017-10-10 DIAGNOSIS — R6889 Other general symptoms and signs: Secondary | ICD-10-CM | POA: Diagnosis not present

## 2017-10-10 NOTE — Telephone Encounter (Signed)
Refill request on Ibuprofen 800 mg.; last refill 08/17/17; #60; refill x 1 Last office visit 10/09/17 PCP: Dr. Lisabeth Devoid.: Ivy [place]. [place].    Not filled due to unsure how long the MD wants pt. to be on this dose.

## 2017-10-10 NOTE — Telephone Encounter (Signed)
Copied from [place] [id number]. Topic: Quick Communication - Rx Refill/Question >> Oct 10, 2017 12:53 PM Margot Ables wrote: Medication: ibuprofen (ADVIL,MOTRIN) 800 MG tablet - 2 days left - change of pharmacy Has the patient contacted their pharmacy? Yes - told MD needs to send new script due to change in pharmacy Preferred Pharmacy (with phone number or street name): [place], [place] [place] [address] Phone: [phone] Fax: [phone]

## 2017-10-11 MED ORDER — IBUPROFEN 800 MG PO TABS: 800.0000 mg | ORAL_TABLET | Freq: Four times a day (QID) | ORAL | 0 refills | Status: DC | PRN

## 2017-10-19 NOTE — Progress Notes (Signed)
Travis Zaldivar Sr. - 67 y.o. male MRN [id number]  Date of birth: 1950/06/08  Office Visit Note: Visit Date: 10/08/2017 PCP: [name], MD Referred by: [name], MD  Subjective: Chief Complaint  Patient presents with  . Lower Back - Pain  . Left Hip - Pain   HPI: Travis [name] is a 67 year old gentleman who comes in today at the request of Dr. [name] who follows him very closely.  We have seen Travis [name] on a few occasions in the [place] completed ablation of the facet joints on the left side.  He has had some relief of his back pain but he continues to have this lower back radiating to the left hip pain is been present for quite some time.  His average pain is 6 out of 10.  Dr. Louanne Skye request injection of the L1 or L4 transforaminal levels diagnostically.  We are going to start with the L4 injection based on his symptoms.  If he does not have much relief over the course of a couple of weeks we would look at the L1 injection.   ROS Otherwise per HPI.  Assessment & Plan: Visit Diagnoses:  1. Lumbar radiculopathy     Plan: No additional findings.   Meds & Orders:  Meds ordered this encounter  Medications  . dexamethasone (DECADRON) injection 15 mg    Orders Placed This Encounter  Procedures  . XR C-ARM NO REPORT  . Epidural Steroid injection    Follow-up: Return if symptoms worsen or fail to improve.   Procedures: No procedures performed  Lumbosacral Transforaminal Epidural Steroid Injection - Sub-Pedicular Approach with Fluoroscopic Guidance  Patient: Travis Burtch Sr.      Date of Birth: June 04, 1950 MRN: [id number] PCP: [name], MD      Visit Date: 10/08/2017   Universal Protocol:    Date/Time: 10/08/2017  Consent Given By: the patient  Position: PRONE  Additional Comments: Vital signs were monitored before and after the procedure. Patient was prepped and draped in the usual sterile fashion. The correct patient, procedure, and site was  verified.   Injection Procedure Details:  Procedure Site One Meds Administered:  Meds ordered this encounter  Medications  . dexamethasone (DECADRON) injection 15 mg    Laterality: Left  Location/Site:  L4-L5  Needle size: 22 G  Needle type: Spinal  Needle Placement: Transforaminal  Findings:    -Comments: Excellent flow of contrast along the nerve and into the epidural space.  Procedure Details: After squaring off the end-plates to get a true AP view, the C-arm was positioned so that an oblique view of the foramen as noted above was visualized. The target area is just inferior to the "nose of the scotty dog" or sub pedicular. The soft tissues overlying this structure were infiltrated with 2-3 ml. of 1% Lidocaine without Epinephrine.  The spinal needle was inserted toward the target using a "trajectory" view along the fluoroscope beam.  Under AP and lateral visualization, the needle was advanced so it did not puncture dura and was located close the 6 O'Clock position of the pedical in AP tracterory. Biplanar projections were used to confirm position. Aspiration was confirmed to be negative for CSF and/or blood. A 1-2 ml. volume of Isovue-250 was injected and flow of contrast was noted at each level. Radiographs were obtained for documentation purposes.   After attaining the desired flow of contrast documented above, a 0.5 to 1.0 ml test dose of 0.25% Marcaine was injected  into each respective transforaminal space.  The patient was observed for 90 seconds post injection.  After no sensory deficits were reported, and normal lower extremity motor function was noted,   the above injectate was administered so that equal amounts of the injectate were placed at each foramen (level) into the transforaminal epidural space.   Additional Comments:  The patient tolerated the procedure well Dressing: Band-Aid    Post-procedure details: Patient was observed during the  procedure. Post-procedure instructions were reviewed.  Patient left the clinic in stable condition.    Clinical History: MRI LUMBAR SPINE WITHOUT AND WITH CONTRAST  TECHNIQUE: Multiplanar and multiecho pulse sequences of the lumbar spine were obtained without and with intravenous contrast.  CONTRAST:  42m MULTIHANCE GADOBENATE DIMEGLUMINE 529 MG/ML IV SOLN  COMPARISON:  MRI lumbar spine August 30, 2015  FINDINGS: SEGMENTATION: For the purposes of this report, the last well-formed intervertebral disc is reported as L5-S1.  ALIGNMENT: Maintained lumbar lordosis. Minimal grade 1 L2-3 retrolisthesis without spondylolysis. Broad dextroscoliosis evident on axial sequences.  VERTEBRAE: Vertebral bodies are intact. Severe L5-S1 disc height loss, unchanged. Worsened severe L2-3 and a moderate L4-5 disc height loss with desiccation. Stable mild disc height loss at the remaining levels with desiccation. Multilevel mild and moderate chronic discogenic endplate changes without acute or abnormal bone marrow signal. Mild ventral disc enhancement T11-12 and T12-L1 associated mild disc edema.  CONUS MEDULLARIS AND CAUDA EQUINA: Conus medullaris terminates at T12-L1 and demonstrates normal morphology and signal characteristics. Cauda equina is normal. No abnormal cord, leptomeningeal or epidural enhancement.  PARASPINAL AND OTHER SOFT TISSUES: Nonacute. Moderate to severe lower paraspinal muscle atrophy slightly progressed from prior imaging.  DISC LEVELS:  T12-L1: No disc bulge, canal stenosis nor neural foraminal narrowing.  L1-2: Moderate broad-based disc bulge, superimposed large RIGHT extraforaminal disc protrusion similar to prior examination which could affect the exited RIGHT L1 nerve. Mild facet arthropathy and ligamentum flavum redundancy. Mild canal stenosis. Mild bilateral caudal neural foraminal narrowing.  L2-3: Retrolisthesis. Endplate spurring, mild  facet arthropathy and ligamentum flavum redundancy. Minimal canal stenosis. Minimal neural foraminal narrowing.  L3-4: Annular bulging eccentric laterally. Moderate facet arthropathy. No canal stenosis. Mild bilateral neural foraminal narrowing.  L4-5: Posterior decompression without canal stenosis. Similar to decreased small broad-based disc bulge. Mild facet arthropathy. Moderate bilateral neural foraminal narrowing. Similar encroachment upon the exited RIGHT L4 nerve.  L5-S1: Endplate spurring without disc bulge. Mild facet arthropathy. No canal stenosis. Mild RIGHT and minimal LEFT neural foraminal narrowing.  IMPRESSION: 1. Status post L4-5 laminectomies. 2. Stable minimal L2-3 retrolisthesis without spondylolysis. Progressed spondylosis without fracture or acute osseous process. 3. Mild canal stenosis L1-2, minimal at L2-3. 4. Neural foraminal narrowing L1-2 through L5-S1: Moderate at L4-5.   Electronically Signed   By: CElon AlasM.D.   On: 09/05/2017 13:57   He reports that he quit smoking about 7 years ago. His smoking use included cigars. He has never used smokeless tobacco.  Recent Labs    03/01/17 0804 07/13/17 1535  HGBA1C 5.7 5.4    Objective:  VS:  HT:    WT:   BMI:     BP:(!) 141/74  HR: bpm  TEMP: ( )  RESP:  Physical Exam  Ortho Exam Imaging: No results found.  Past Medical/Family/Surgical/Social History: Medications & Allergies reviewed per EMR, new medications updated. Patient Active Problem List   Diagnosis Date Noted  . Morbid obesity ([place] 10/09/2017  . Exposure to sexually transmitted disease (STD) 07/14/2017  .  Low back pain 07/13/2017  . Leukoplakia, tongue 01/26/2017  . Acute sinus infection 10/20/2016  . Hyperglycemia 10/20/2016  . Skin lesion 10/20/2016  . S/P unilateral BKA (below knee amputation), right ([place]) 06/14/2016  . Charcot foot due to diabetes mellitus ([place])   . Charcot's arthropathy associated with type  2 diabetes mellitus ([place]) 04/11/2016  . Encounter for well adult exam with abnormal findings 11/05/2015  . Major depression 09/13/2015  . S/P TKR (total knee replacement) bilaterally 09/13/2015  . GERD (gastroesophageal reflux disease) 09/08/2015  . S/P laparoscopic hernia repair 05/11/2015  . PPD positive 04/08/2015  . Benign neoplasm of descending colon   . Benign neoplasm of cecum   . AKI (acute kidney injury) ([place]) 10/18/2014  . Acute blood loss anemia   . Chronic anticoagulation   . Occult blood positive stool 10/17/2014  . General weakness 07/14/2014  . Urinary incontinence 07/14/2014  . Headache(784.0) 10/15/2013  . Spinal stenosis in cervical region 09/26/2013    Class: Chronic  . Spinal stenosis, lumbar region, with neurogenic claudication 09/26/2013    Class: Chronic  . Hand joint pain 06/10/2013  . Rotator cuff tear arthropathy of both shoulders 06/10/2013  . Skin lesion of cheek 05/01/2013  . Pain of right thumb 04/03/2013  . Balance disorder 03/12/2013  . Gait disorder 03/12/2013  . Tremor 03/12/2013  . Left hip pain 03/12/2013  . Pre-ulcerative corn or callous 02/06/2013  . Anxiety 11/12/2011  . OSA (obstructive sleep apnea) 11/07/2011  . Bradycardia 10/20/2011  . Insomnia 10/04/2011  . Obesity 01/12/2011  . ERECTILE DYSFUNCTION, ORGANIC 05/30/2010  . [place] DISEASE, LUMBAR 04/19/2010  . SCIATICA, LEFT 04/19/2010  . Chronic pain syndrome 10/27/2009  . Hyperlipidemia 07/15/2009  . Essential hypertension 06/24/2009  . CAD (coronary artery disease) 06/24/2009  . Allergic rhinitis 06/24/2009  . URETHRAL STRICTURE 06/24/2009  . DEGENERATIVE JOINT DISEASE 06/24/2009  . SHOULDER PAIN, BILATERAL 06/24/2009  . FATIGUE 06/24/2009  . NEPHROLITHIASIS, HX OF 06/24/2009   Past Medical History:  Diagnosis Date  . ALLERGIC RHINITIS 06/24/2009  . Allergic rhinitis 06/24/2009   Qualifier: Diagnosis of  By: [name], [name]   . Anxiety 11/12/2011   Adequate for discharge    . Arthritis    "all my joints" (09/30/2013)  . Arthritis of foot, right, degenerative 04/15/2014  . Balance disorder 03/12/2013  . Benign neoplasm of cecum   . Benign neoplasm of descending colon   . CAD (coronary artery disease) 06/24/2009   5 stents placed in 2007    . Chronic anticoagulation   . Chronic pain syndrome 10/27/2009   of ankle, shoulders, low back.  sciatica.   . Closed fracture of right foot 10/17/2014  . CORONARY ARTERY DISEASE 06/24/2009   a. s/p multiple PCIs - In 2008 he had a Taxus DES to the mild LAD, Endeavor DES to mid LCX and distal LCX. In January 2009 he had DES to distal LCX, mid LCX and proximal LCX. In November 2009 had BMS x 2 to the mid RCA. Cath 10/2011 with patent stents, noncardiac CP. LHC 01/2013: patent stents (noncardiac CP).  . DEGENERATIVE JOINT DISEASE 06/24/2009   Qualifier: Diagnosis of  By: [name], [name]   . Depression   . Depression with anxiety    Prior suicide attempt  . [place] DISEASE, LUMBAR 04/19/2010  . ERECTILE DYSFUNCTION, ORGANIC 05/30/2010  . Essential hypertension 06/24/2009   Qualifier: Diagnosis of  By: [name], [name]   . Fibromyalgia   . Fracture  dislocation of ankle joint 09/02/2015  . Gait disorder 03/12/2013  . General weakness 07/14/2014  . GERD (gastroesophageal reflux disease) 09/08/2015  . Hand joint pain 06/10/2013  . Heart murmur   . Hepatitis C   . History of kidney stones   . Hyperlipidemia 07/15/2009   Qualifier: Diagnosis of  By: Aundra Dubin, MD, [place]    . HYPERLIPIDEMIA-MIXED 07/15/2009  . HYPERTENSION 06/24/2009  . Insomnia 10/04/2011  . Irregular heart beat   . Left hip pain 03/12/2013   Injected under ultrasound guidance on June 24, 2013   . Major depression 09/13/2015  . Myocardial infarction ([place]) 2008  . Non-cardiac chest pain 10/2011, 01/2013  . Obesity   . Occult blood positive stool 10/17/2014  . Open ankle fracture 09/02/2015  . OSA (obstructive sleep apnea)    not using CPAP (09/30/2013)  . Pain of right thumb  04/03/2013  . Pneumonia   . PPD positive 04/08/2015  . Pre-ulcerative corn or callous 02/06/2013  . Restless legs   . Rotator cuff tear arthropathy of both shoulders 06/10/2013   History of bilateral shoulder cuff surgery for rotator cuff tears. Reports increase in pain 09/11/2015 during physical therapy of the left shoulder.   [name] SCIATICA, LEFT 04/19/2010   Qualifier: Diagnosis of  By: [name], [name]   . Spinal stenosis in cervical region 09/26/2013  . Spinal stenosis, lumbar region, with neurogenic claudication 09/26/2013  . Type II diabetes mellitus ([place]) 2012   no meds in 09/2014.   [name] Uncontrolled type 2 DM with peripheral circulatory disorder ([place]) 10/04/2013  . URETHRAL STRICTURE 06/24/2009   self catheterizes.    Family History  Problem Relation Age of Onset  . Depression Mother   . Heart disease Mother   . Hypertension Mother   . Cancer Mother        Breast  . Diabetes Father   . Heart disease Father        CABG  . Cancer Father        prostate and skin cancer  . Hypertension Father   . Hyperlipidemia Father   . Depression Brother        x 2  . Hypertension Brother        x2  . Heart disease Maternal Grandfather   . Early death Maternal Grandfather 32       heart attack  . Early death Paternal Grandfather   . Coronary artery disease Other   . Hypertension Other   . Depression Other    Past Surgical History:  Procedure Laterality Date  . AMPUTATION Right 06/14/2016   Procedure: AMPUTATION BELOW KNEE;  Surgeon: Newt Minion, MD;  Location: [facility];  Service: Orthopedics;  Laterality: Right;  . ANKLE FUSION Right 04/15/2014   Procedure: Right Subtalar, Talonavicular Fusion;  Surgeon: Newt Minion, MD;  Location: [facility];  Service: Orthopedics;  Laterality: Right;  . ANKLE FUSION Right 04/18/2016   Right Ankle Tibiocalcaneal Fusion; Removal of deep retained hardware; application of wound vac/notes 04/18/2016  . ANKLE FUSION Right 04/18/2016   Procedure: Right Ankle  Tibiocalcaneal Fusion;  Surgeon: Newt Minion, MD;  Location: [facility];  Service: Orthopedics;  Laterality: Right;  . ANTERIOR CERVICAL DECOMP/DISCECTOMY FUSION N/A 09/26/2013   Procedure: ANTERIOR CERVICAL DISCECTOMY FUSION C3-4, plate and screw fixation, allograft bone graft;  Surgeon: Jessy Oto, MD;  Location: [facility];  Service: Orthopedics;  Laterality: N/A;  . BACK SURGERY    . BELOW KNEE LEG AMPUTATION  Right 06/14/2016  . CARDIAC CATHETERIZATION  X 1  . CARPAL TUNNEL RELEASE Bilateral   . COLONOSCOPY    . COLONOSCOPY N/A 10/22/2014   Procedure: COLONOSCOPY;  Surgeon: Lafayette Dragon, MD;  Location: [facility] ENDOSCOPY;  Service: Endoscopy;  Laterality: N/A;  . CORONARY ANGIOPLASTY WITH STENT PLACEMENT     "I have 9 stents"  . ESOPHAGOGASTRODUODENOSCOPY N/A 10/19/2014   Procedure: ESOPHAGOGASTRODUODENOSCOPY (EGD);  Surgeon: Jerene Bears, MD;  [facility] END[facility]COPY;  Service: Endoscopy;  Laterality: N/A;  . FUSION OF TALONAVICULAR JOINT Right 04/15/2014   dr duda  . HERNIA REPAIR     umbilical  . INGUINAL HERNIA REPAIR Right 05/11/2015   Procedure: LAPAROSCOPIC REPAIR RIGHT  INGUINAL HERNIA;  Surgeon: Greer Pickerel, MD;  Location: [facility];  Service: General;  Laterality: Right;  . INSERTION OF MESH Right 05/11/2015   Procedure: INSERTION OF MESH;  Surgeon: Greer Pickerel, MD;  Location: [facility];  Service: General;  Laterality: Right;  . JOINT REPLACEMENT Bilateral    knees  . KNEE CARTILAGE SURGERY Right X 12   "~ 1/2 open; ~ 1/2 scopes"  . KNEE CARTILAGE SURGERY Left X 3   "3 scopes"  . LEFT HEART CATHETERIZATION WITH CORONARY ANGIOGRAM N/A 02/10/2013   Procedure: LEFT HEART CATHETERIZATION WITH CORONARY ANGIOGRAM;  Surgeon: Burnell Blanks, MD;  Location: [facility] CATH LAB;  Service: Cardiovascular;  Laterality: N/A;  . LUMBAR LAMINECTOMY/DECOMPRESSION MICRODISCECTOMY N/A 01/27/2014   Procedure: CENTRAL LUMBAR LAMINECTOMY L4-5 AND L3-4;  Surgeon: Jessy Oto, MD;  Location: [facility];  Service:  Orthopedics;  Laterality: N/A;  . ORIF ANKLE FRACTURE Right 09/02/2015   Procedure: OPEN REDUCTION INTERNAL FIXATION (ORIF) ANKLE FRACTURE;  Surgeon: Newt Minion, MD;  Location: [facility];  Service: Orthopedics;  Laterality: Right;  . PERIPHERALLY INSERTED CENTRAL CATHETER INSERTION  09/02/2015  . SHOULDER ARTHROSCOPY W/ ROTATOR CUFF REPAIR Bilateral    "3 on the right; 1 on the left"  . SKIN SPLIT GRAFT Right 10/01/2015   Procedure: RIGHT ANKLE APPLY SKIN GRAFT SPLIT THICKNESS;  Surgeon: Newt Minion, MD;  Location: [facility];  Service: Orthopedics;  Laterality: Right;  . TONSILLECTOMY    . TOTAL KNEE ARTHROPLASTY Bilateral 2008  . UMBILICAL HERNIA REPAIR     UHR  . URETHRAL DILATION  X 4  . VASECTOMY     Social History   Occupational History  . Occupation: disabled since 2006 due to ortho. heart, psych    Employer: UNEMPLOYED  . Occupation: part time work as an Multimedia programmer, wrestling, and Holiday representative  Tobacco Use  . Smoking status: Former Smoker    Types: Cigars    Last attempt to quit: 08/28/2010    Years since quitting: 7.1  . Smokeless tobacco: Never Used  . Tobacco comment: 04/18/2016 "smoked 1 cigar/wk when I did smoke"  Substance and Sexual Activity  . Alcohol use: No    Alcohol/week: 0.0 oz    Comment: rarely  . Drug use: No  . Sexual activity: Not Currently

## 2017-10-19 NOTE — Procedures (Signed)
Lumbosacral Transforaminal Epidural Steroid Injection - Sub-Pedicular Approach with Fluoroscopic Guidance  Patient: Travis Kassim Sr.      Date of Birth: [birth year] MRN: [id number] PCP: [name], MD      Visit Date: 10/08/2017   Universal Protocol:    Date/Time: 10/08/2017  Consent Given By: the patient  Position: PRONE  Additional Comments: Vital signs were monitored before and after the procedure. Patient was prepped and draped in the usual sterile fashion. The correct patient, procedure, and site was verified.   Injection Procedure Details:  Procedure Site One Meds Administered:  Meds ordered this encounter  Medications  . dexamethasone (DECADRON) injection 15 mg    Laterality: Left  Location/Site:  L4-L5  Needle size: 22 G  Needle type: Spinal  Needle Placement: Transforaminal  Findings:    -Comments: Excellent flow of contrast along the nerve and into the epidural space.  Procedure Details: After squaring off the end-plates to get a true AP view, the C-arm was positioned so that an oblique view of the foramen as noted above was visualized. The target area is just inferior to the "nose of the scotty dog" or sub pedicular. The soft tissues overlying this structure were infiltrated with 2-3 ml. of 1% Lidocaine without Epinephrine.  The spinal needle was inserted toward the target using a "trajectory" view along the fluoroscope beam.  Under AP and lateral visualization, the needle was advanced so it did not puncture dura and was located close the 6 O'Clock position of the pedical in AP tracterory. Biplanar projections were used to confirm position. Aspiration was confirmed to be negative for CSF and/or blood. A 1-2 ml. volume of Isovue-250 was injected and flow of contrast was noted at each level. Radiographs were obtained for documentation purposes.   After attaining the desired flow of contrast documented above, a 0.5 to 1.0 ml test dose of 0.25% Marcaine was  injected into each respective transforaminal space.  The patient was observed for 90 seconds post injection.  After no sensory deficits were reported, and normal lower extremity motor function was noted,   the above injectate was administered so that equal amounts of the injectate were placed at each foramen (level) into the transforaminal epidural space.   Additional Comments:  The patient tolerated the procedure well Dressing: Band-Aid    Post-procedure details: Patient was observed during the procedure. Post-procedure instructions were reviewed.  Patient left the clinic in stable condition.

## 2017-10-22 ENCOUNTER — Ambulatory Visit (INDEPENDENT_AMBULATORY_CARE_PROVIDER_SITE_OTHER): Payer: Medicare HMO | Admitting: Specialist

## 2017-10-22 ENCOUNTER — Encounter (INDEPENDENT_AMBULATORY_CARE_PROVIDER_SITE_OTHER): Payer: Self-pay | Admitting: Specialist

## 2017-10-22 VITALS — BP 150/70 | HR 64 | Ht 72.0 in | Wt 304.0 lb

## 2017-10-22 DIAGNOSIS — M48062 Spinal stenosis, lumbar region with neurogenic claudication: Secondary | ICD-10-CM

## 2017-10-22 DIAGNOSIS — R6889 Other general symptoms and signs: Secondary | ICD-10-CM | POA: Diagnosis not present

## 2017-10-22 DIAGNOSIS — M48061 Spinal stenosis, lumbar region without neurogenic claudication: Secondary | ICD-10-CM

## 2017-10-22 MED ORDER — ACETAMINOPHEN-CODEINE #4 300-60 MG PO TABS: 1.0000 | ORAL_TABLET | ORAL | 0 refills | Status: DC | PRN

## 2017-10-22 MED ORDER — PREGABALIN 75 MG PO CAPS
75.0000 mg | ORAL_CAPSULE | Freq: Two times a day (BID) | ORAL | 3 refills | Status: DC
Start: 2017-10-22 — End: 2018-01-14

## 2017-10-22 NOTE — Patient Instructions (Signed)
Avoid bending, stooping and avoid lifting weights greater than 10 lbs. Avoid prolong standing and walking. Avoid frequent bending and stooping  No lifting greater than 10 lbs. May use ice or moist heat for pain. Weight loss is of benefit. Handicap license is approved.  

## 2017-10-22 NOTE — Progress Notes (Signed)
Office Visit Note   Patient: Travis Zeimet Sr.           Date of Birth: December 20, 1950           MRN: [id number] Visit Date: 10/22/2017              Requested by: [name], MD [place], [place] [address] PCP: [name], MD   Assessment & Plan: Visit Diagnoses:  1. Spinal stenosis of lumbar region with neurogenic claudication   2. Degenerative lumbar spinal stenosis     Plan: Avoid bending, stooping and avoid lifting weights greater than 10 lbs. Avoid prolong standing and walking. Avoid frequent bending and stooping  No lifting greater than 10 lbs. May use ice or moist heat for pain. Weight loss is of benefit. Handicap license is approved.  Follow-Up Instructions: Return in about 1 month (around 11/19/2017).   Orders:  No orders of the defined types were placed in this encounter.  No orders of the defined types were placed in this encounter.     Procedures: No procedures performed   Clinical Data: No additional findings.   Subjective: Chief Complaint  Patient presents with  . Lower Back - Follow-up    67 year old male with history of L3-4  And L4-5 laminectomy for stenosis 01/2104 returns with left buttock pain with radiation into the left thigh and weakness left leg with standing in one place and with walking. He is using a cane on the left side. Underwent ESI L4-5 5/20 with good relief of about 40% of the left thigh pain. He reports that the pain has returned. No bowel or bladder difficutly. The pain is left anterolateral thigh to the left knee.    Review of Systems  Constitutional: Negative.   HENT: Negative.   Eyes: Negative.   Respiratory: Negative.   Cardiovascular: Negative.   Gastrointestinal: Negative.   Endocrine: Negative.   Genitourinary: Negative.   Musculoskeletal: Negative.   Skin: Negative.   Allergic/Immunologic: Negative.   Neurological: Negative.   Hematological: Negative.   Psychiatric/Behavioral: Negative.        Objective: Vital Signs: BP (!) 150/70 (BP Location: Left Arm, Patient Position: Sitting)   Pulse 64   Ht 6' (1.829 m)   Wt (!) 304 lb (137.9 kg)   BMI 41.23 kg/m   Physical Exam  Constitutional: He is oriented to person, place, and time. He appears well-developed and well-nourished.  HENT:  Head: Normocephalic and atraumatic.  Eyes: Pupils are equal, round, and reactive to light. EOM are normal.  Neck: Normal range of motion. Neck supple.  Pulmonary/Chest: Effort normal and breath sounds normal.  Abdominal: Soft. Bowel sounds are normal.  Musculoskeletal: Normal range of motion.  Neurological: He is alert and oriented to person, place, and time.  Skin: Skin is warm and dry.  Psychiatric: He has a normal mood and affect. His behavior is normal. Judgment and thought content normal.    Back Exam   Tenderness  The patient is experiencing tenderness in the lumbar.  Muscle Strength  Right Quadriceps:  5/5  Left Quadriceps:  5/5  Right Hamstrings:  5/5  Left Hamstrings:  5/5   Tests  Straight leg raise right: negative Straight leg raise left: negative  Reflexes  Patellar:  0/4 abnormal Achilles:  0/4 abnormal Babinski's sign: normal   Other  Toe walk: normal Heel walk: normal      Specialty Comments:  No specialty comments available.  Imaging: No results found.   PMFS History: Patient Active Problem List   Diagnosis Date Noted  . Spinal stenosis in cervical region 09/26/2013    Priority: High    Class: Chronic  . Spinal stenosis, lumbar region, with neurogenic claudication 09/26/2013    Priority: High    Class: Chronic  . Morbid obesity ([place]) 10/09/2017  . Exposure to sexually transmitted disease (STD) 07/14/2017  . Low back pain 07/13/2017  . Leukoplakia, tongue 01/26/2017  . Acute sinus infection 10/20/2016  . Hyperglycemia 10/20/2016  . Skin lesion 10/20/2016  . S/P unilateral BKA (below knee amputation), right ([place]) 06/14/2016  . Charcot  foot due to diabetes mellitus ([place])   . Charcot's arthropathy associated with type 2 diabetes mellitus ([place]) 04/11/2016  . Encounter for well adult exam with abnormal findings 11/05/2015  . Major depression 09/13/2015  . S/P TKR (total knee replacement) bilaterally 09/13/2015  . GERD (gastroesophageal reflux disease) 09/08/2015  . S/P laparoscopic hernia repair 05/11/2015  . PPD positive 04/08/2015  . Benign neoplasm of descending colon   . Benign neoplasm of cecum   . AKI (acute kidney injury) ([place]) 10/18/2014  . Acute blood loss anemia   . Chronic anticoagulation   . Occult blood positive stool 10/17/2014  . General weakness 07/14/2014  . Urinary incontinence 07/14/2014  . Headache(784.0) 10/15/2013  . Hand joint pain 06/10/2013  . Rotator cuff tear arthropathy of both shoulders 06/10/2013  . Skin lesion of cheek 05/01/2013  . Pain of right thumb 04/03/2013  . Balance disorder 03/12/2013  . Gait disorder 03/12/2013  . Tremor 03/12/2013  . Left hip pain 03/12/2013  . Pre-ulcerative corn or callous 02/06/2013  . Anxiety 11/12/2011  . OSA (obstructive sleep apnea) 11/07/2011  . Bradycardia 10/20/2011  . Insomnia 10/04/2011  . Obesity 01/12/2011  . ERECTILE DYSFUNCTION, ORGANIC 05/30/2010  . [place] DISEASE, LUMBAR 04/19/2010  . SCIATICA, LEFT 04/19/2010  . Chronic pain syndrome 10/27/2009  . Hyperlipidemia 07/15/2009  . Essential hypertension 06/24/2009  . CAD (coronary artery disease) 06/24/2009  . Allergic rhinitis 06/24/2009  . URETHRAL STRICTURE 06/24/2009  . DEGENERATIVE JOINT DISEASE 06/24/2009  . SHOULDER PAIN, BILATERAL 06/24/2009  . FATIGUE 06/24/2009  . NEPHROLITHIASIS, HX OF 06/24/2009   Past Medical History:  Diagnosis Date  . ALLERGIC RHINITIS 06/24/2009  . Allergic rhinitis 06/24/2009   Qualifier: Diagnosis of  By: [name], [name]   . Anxiety 11/12/2011   Adequate for discharge   . Arthritis    "all my joints" (09/30/2013)  . Arthritis of foot, right,  degenerative 04/15/2014  . Balance disorder 03/12/2013  . Benign neoplasm of cecum   . Benign neoplasm of descending colon   . CAD (coronary artery disease) 06/24/2009   5 stents placed in 2007    . Chronic anticoagulation   . Chronic pain syndrome 10/27/2009   of ankle, shoulders, low back.  sciatica.   . Closed fracture of right foot 10/17/2014  . CORONARY ARTERY DISEASE 06/24/2009   a. s/p multiple PCIs - In 2008 he had a Taxus DES to the mild LAD, Endeavor DES to mid LCX and distal LCX. In January 2009 he had DES to distal LCX, mid LCX and proximal LCX. In November 2009 had BMS x 2 to the mid RCA. Cath 10/2011 with patent stents, noncardiac CP. LHC 01/2013: patent stents (noncardiac CP).  . DEGENERATIVE JOINT DISEASE 06/24/2009   Qualifier: Diagnosis of  By: [name], [name]   . Depression   . Depression  with anxiety    Prior suicide attempt  . [place] DISEASE, LUMBAR 04/19/2010  . ERECTILE DYSFUNCTION, ORGANIC 05/30/2010  . Essential hypertension 06/24/2009   Qualifier: Diagnosis of  By: [name], [name]   . Fibromyalgia   . Fracture dislocation of ankle joint 09/02/2015  . Gait disorder 03/12/2013  . General weakness 07/14/2014  . GERD (gastroesophageal reflux disease) 09/08/2015  . Hand joint pain 06/10/2013  . Heart murmur   . Hepatitis C   . History of kidney stones   . Hyperlipidemia 07/15/2009   Qualifier: Diagnosis of  By: Aundra Dubin, MD, [place]    . HYPERLIPIDEMIA-MIXED 07/15/2009  . HYPERTENSION 06/24/2009  . Insomnia 10/04/2011  . Irregular heart beat   . Left hip pain 03/12/2013   Injected under ultrasound guidance on June 24, 2013   . Major depression 09/13/2015  . Myocardial infarction ([place]) 2008  . Non-cardiac chest pain 10/2011, 01/2013  . Obesity   . Occult blood positive stool 10/17/2014  . Open ankle fracture 09/02/2015  . OSA (obstructive sleep apnea)    not using CPAP (09/30/2013)  . Pain of right thumb 04/03/2013  . Pneumonia   . PPD positive 04/08/2015  . Pre-ulcerative corn  or callous 02/06/2013  . Restless legs   . Rotator cuff tear arthropathy of both shoulders 06/10/2013   History of bilateral shoulder cuff surgery for rotator cuff tears. Reports increase in pain 09/11/2015 during physical therapy of the left shoulder.   [name] SCIATICA, LEFT 04/19/2010   Qualifier: Diagnosis of  By: [name], [name]   . Spinal stenosis in cervical region 09/26/2013  . Spinal stenosis, lumbar region, with neurogenic claudication 09/26/2013  . Type II diabetes mellitus ([place]) 2012   no meds in 09/2014.   [name] Uncontrolled type 2 DM with peripheral circulatory disorder ([place]) 10/04/2013  . URETHRAL STRICTURE 06/24/2009   self catheterizes.     Family History  Problem Relation Age of Onset  . Depression Mother   . Heart disease Mother   . Hypertension Mother   . Cancer Mother        Breast  . Diabetes Father   . Heart disease Father        CABG  . Cancer Father        prostate and skin cancer  . Hypertension Father   . Hyperlipidemia Father   . Depression Brother        x 2  . Hypertension Brother        x2  . Heart disease Maternal Grandfather   . Early death Maternal Grandfather 44       heart attack  . Early death Paternal Grandfather   . Coronary artery disease Other   . Hypertension Other   . Depression Other     Past Surgical History:  Procedure Laterality Date  . AMPUTATION Right 06/14/2016   Procedure: AMPUTATION BELOW KNEE;  Surgeon: Newt Minion, MD;  Location: [facility];  Service: Orthopedics;  Laterality: Right;  . ANKLE FUSION Right 04/15/2014   Procedure: Right Subtalar, Talonavicular Fusion;  Surgeon: Newt Minion, MD;  Location: [facility];  Service: Orthopedics;  Laterality: Right;  . ANKLE FUSION Right 04/18/2016   Right Ankle Tibiocalcaneal Fusion; Removal of deep retained hardware; application of wound vac/notes 04/18/2016  . ANKLE FUSION Right 04/18/2016   Procedure: Right Ankle Tibiocalcaneal Fusion;  Surgeon: Newt Minion, MD;  Location: [facility];  Service:  Orthopedics;  Laterality: Right;  . ANTERIOR CERVICAL DECOMP/DISCECTOMY  FUSION N/A 09/26/2013   Procedure: ANTERIOR CERVICAL DISCECTOMY FUSION C3-4, plate and screw fixation, allograft bone graft;  Surgeon: Jessy Oto, MD;  Location: [facility];  Service: Orthopedics;  Laterality: N/A;  . BACK SURGERY    . BELOW KNEE LEG AMPUTATION Right 06/14/2016  . CARDIAC CATHETERIZATION  X 1  . CARPAL TUNNEL RELEASE Bilateral   . COLONOSCOPY    . COLONOSCOPY N/A 10/22/2014   Procedure: COLONOSCOPY;  Surgeon: Lafayette Dragon, MD;  Location: [facility]COPY;  Service: Endoscopy;  Laterality: N/A;  . CORONARY ANGIOPLASTY WITH STENT PLACEMENT     "I have 9 stents"  . ESOPHAGOGASTRODUODENOSCOPY N/A 10/19/2014   Procedure: ESOPHAGOGASTRODUODENOSCOPY (EGD);  Surgeon: Jerene Bears, MD;  Location: [facility] ENDOSCOPY;  Service: Endoscopy;  Laterality: N/A;  . FUSION OF TALONAVICULAR JOINT Right 04/15/2014   dr duda  . HERNIA REPAIR     umbilical  . INGUINAL HERNIA REPAIR Right 05/11/2015   Procedure: LAPAROSCOPIC REPAIR RIGHT  INGUINAL HERNIA;  Surgeon: Greer Pickerel, MD;  Location: [facility];  Service: General;  Laterality: Right;  . INSERTION OF MESH Right 05/11/2015   Procedure: INSERTION OF MESH;  Surgeon: Greer Pickerel, MD;  Location: [facility];  Service: General;  Laterality: Right;  . JOINT REPLACEMENT Bilateral    knees  . KNEE CARTILAGE SURGERY Right X 12   "~ 1/2 open; ~ 1/2 scopes"  . KNEE CARTILAGE SURGERY Left X 3   "3 scopes"  . LEFT HEART CATHETERIZATION WITH CORONARY ANGIOGRAM N/A 02/10/2013   Procedure: LEFT HEART CATHETERIZATION WITH CORONARY ANGIOGRAM;  Surgeon: Burnell Blanks, MD;  Location: [facility] CATH LAB;  Service: Cardiovascular;  Laterality: N/A;  . LUMBAR LAMINECTOMY/DECOMPRESSION MICRODISCECTOMY N/A 01/27/2014   Procedure: CENTRAL LUMBAR LAMINECTOMY L4-5 AND L3-4;  Surgeon: Jessy Oto, MD;  Location: [facility];  Service: Orthopedics;  Laterality: N/A;  . ORIF ANKLE FRACTURE Right 09/02/2015   Procedure: OPEN  REDUCTION INTERNAL FIXATION (ORIF) ANKLE FRACTURE;  Surgeon: Newt Minion, MD;  Location: [facility];  Service: Orthopedics;  Laterality: Right;  . PERIPHERALLY INSERTED CENTRAL CATHETER INSERTION  09/02/2015  . SHOULDER ARTHROSCOPY W/ ROTATOR CUFF REPAIR Bilateral    "3 on the right; 1 on the left"  . SKIN SPLIT GRAFT Right 10/01/2015   Procedure: RIGHT ANKLE APPLY SKIN GRAFT SPLIT THICKNESS;  Surgeon: Newt Minion, MD;  Location: [facility];  Service: Orthopedics;  Laterality: Right;  . TONSILLECTOMY    . TOTAL KNEE ARTHROPLASTY Bilateral 2008  . UMBILICAL HERNIA REPAIR     UHR  . URETHRAL DILATION  X 4  . VASECTOMY     Social History   Occupational History  . Occupation: disabled since 2006 due to ortho. heart, psych    Employer: UNEMPLOYED  . Occupation: part time work as an Multimedia programmer, wrestling, and Holiday representative  Tobacco Use  . Smoking status: Former Smoker    Types: Cigars    Last attempt to quit: 08/28/2010    Years since quitting: 7.1  . Smokeless tobacco: Never Used  . Tobacco comment: 04/18/2016 "smoked 1 cigar/wk when I did smoke"  Substance and Sexual Activity  . Alcohol use: No    Alcohol/week: 0.0 oz    Comment: rarely  . Drug use: No  . Sexual activity: Not Currently

## 2017-10-25 ENCOUNTER — Other Ambulatory Visit: Payer: Self-pay | Admitting: Cardiology

## 2017-10-25 NOTE — Telephone Encounter (Signed)
Rx request sent to pharmacy.  

## 2017-10-29 DIAGNOSIS — R69 Illness, unspecified: Secondary | ICD-10-CM | POA: Diagnosis not present

## 2017-10-29 DIAGNOSIS — R6889 Other general symptoms and signs: Secondary | ICD-10-CM | POA: Diagnosis not present

## 2017-10-30 ENCOUNTER — Other Ambulatory Visit: Payer: Self-pay | Admitting: Internal Medicine

## 2017-11-01 ENCOUNTER — Ambulatory Visit: Payer: Medicare HMO | Attending: Specialist | Admitting: Rehabilitative and Restorative Service Providers"

## 2017-11-01 ENCOUNTER — Other Ambulatory Visit: Payer: Self-pay

## 2017-11-01 DIAGNOSIS — R2689 Other abnormalities of gait and mobility: Secondary | ICD-10-CM | POA: Insufficient documentation

## 2017-11-01 DIAGNOSIS — M6281 Muscle weakness (generalized): Secondary | ICD-10-CM | POA: Diagnosis not present

## 2017-11-01 DIAGNOSIS — R293 Abnormal posture: Secondary | ICD-10-CM | POA: Diagnosis not present

## 2017-11-01 DIAGNOSIS — M5440 Lumbago with sciatica, unspecified side: Secondary | ICD-10-CM

## 2017-11-01 DIAGNOSIS — R6889 Other general symptoms and signs: Secondary | ICD-10-CM | POA: Diagnosis not present

## 2017-11-01 NOTE — Therapy (Signed)
[facility] [address] [place], [place], [address] Phone: [phone]   Fax:  [phone]  Physical Therapy Evaluation  Patient Details  Name: Travis Uphoff Sr. MRN: [id number] Date of Birth: 04/07/1951 Referring Provider: [name], MD   Encounter Date: 11/01/2017  PT End of Session - 11/01/17 2154    Visit Number  1    Number of Visits  12    Date for PT Re-Evaluation  12/31/17    Authorization Type  [employer] medicare and medicaid    PT Start Time  1410    PT Stop Time  1450    PT Time Calculation (min)  40 min    Activity Tolerance  Patient tolerated treatment well;Patient limited by pain    Behavior During Therapy  [facility] for tasks assessed/performed       Past Medical History:  Diagnosis Date  . ALLERGIC RHINITIS 06/24/2009  . Allergic rhinitis 06/24/2009   Qualifier: Diagnosis of  By: [name], [name]   . Anxiety 11/12/2011   Adequate for discharge   . Arthritis    "all my joints" (09/30/2013)  . Arthritis of foot, right, degenerative 04/15/2014  . Balance disorder 03/12/2013  . Benign neoplasm of cecum   . Benign neoplasm of descending colon   . CAD (coronary artery disease) 06/24/2009   5 stents placed in 2007    . Chronic anticoagulation   . Chronic pain syndrome 10/27/2009   of ankle, shoulders, low back.  sciatica.   . Closed fracture of right foot 10/17/2014  . CORONARY ARTERY DISEASE 06/24/2009   a. s/p multiple PCIs - In 2008 he had a Taxus DES to the mild LAD, Endeavor DES to mid LCX and distal LCX. In January 2009 he had DES to distal LCX, mid LCX and proximal LCX. In November 2009 had BMS x 2 to the mid RCA. Cath 10/2011 with patent stents, noncardiac CP. LHC 01/2013: patent stents (noncardiac CP).  . DEGENERATIVE JOINT DISEASE 06/24/2009   Qualifier: Diagnosis of  By: [name], [name]   . Depression   . Depression with anxiety    Prior suicide attempt  . [place] DISEASE, LUMBAR 04/19/2010  . ERECTILE  DYSFUNCTION, ORGANIC 05/30/2010  . Essential hypertension 06/24/2009   Qualifier: Diagnosis of  By: [name], [name]   . Fibromyalgia   . Fracture dislocation of ankle joint 09/02/2015  . Gait disorder 03/12/2013  . General weakness 07/14/2014  . GERD (gastroesophageal reflux disease) 09/08/2015  . Hand joint pain 06/10/2013  . Heart murmur   . Hepatitis C   . History of kidney stones   . Hyperlipidemia 07/15/2009   Qualifier: Diagnosis of  By: Aundra Dubin, MD, [place]    . HYPERLIPIDEMIA-MIXED 07/15/2009  . HYPERTENSION 06/24/2009  . Insomnia 10/04/2011  . Irregular heart beat   . Left hip pain 03/12/2013   Injected under ultrasound guidance on June 24, 2013   . Major depression 09/13/2015  . Myocardial infarction ([place]) 2008  . Non-cardiac chest pain 10/2011, 01/2013  . Obesity   . Occult blood positive stool 10/17/2014  . Open ankle fracture 09/02/2015  . OSA (obstructive sleep apnea)    not using CPAP (09/30/2013)  . Pain of right thumb 04/03/2013  . Pneumonia   . PPD positive 04/08/2015  . Pre-ulcerative corn or callous 02/06/2013  . Restless legs   . Rotator cuff tear arthropathy of both shoulders 06/10/2013   History of bilateral shoulder cuff surgery for rotator  cuff tears. Reports increase in pain 09/11/2015 during physical therapy of the left shoulder.   [name] SCIATICA, LEFT 04/19/2010   Qualifier: Diagnosis of  By: [name], [name]   . Spinal stenosis in cervical region 09/26/2013  . Spinal stenosis, lumbar region, with neurogenic claudication 09/26/2013  . Type II diabetes mellitus ([place]) 2012   no meds in 09/2014.   [name] Uncontrolled type 2 DM with peripheral circulatory disorder ([place]) 10/04/2013  . URETHRAL STRICTURE 06/24/2009   self catheterizes.     Past Surgical History:  Procedure Laterality Date  . AMPUTATION Right 06/14/2016   Procedure: AMPUTATION BELOW KNEE;  Surgeon: Newt Minion, MD;  Location: [facility];  Service: Orthopedics;  Laterality: Right;  . ANKLE FUSION Right 04/15/2014    Procedure: Right Subtalar, Talonavicular Fusion;  Surgeon: Newt Minion, MD;  Location: [facility];  Service: Orthopedics;  Laterality: Right;  . ANKLE FUSION Right 04/18/2016   Right Ankle Tibiocalcaneal Fusion; Removal of deep retained hardware; application of wound vac/notes 04/18/2016  . ANKLE FUSION Right 04/18/2016   Procedure: Right Ankle Tibiocalcaneal Fusion;  Surgeon: Newt Minion, MD;  Location: [facility];  Service: Orthopedics;  Laterality: Right;  . ANTERIOR CERVICAL DECOMP/DISCECTOMY FUSION N/A 09/26/2013   Procedure: ANTERIOR CERVICAL DISCECTOMY FUSION C3-4, plate and screw fixation, allograft bone graft;  Surgeon: Jessy Oto, MD;  Location: [facility];  Service: Orthopedics;  Laterality: N/A;  . BACK SURGERY    . BELOW KNEE LEG AMPUTATION Right 06/14/2016  . CARDIAC CATHETERIZATION  X 1  . CARPAL TUNNEL RELEASE Bilateral   . COLONOSCOPY    . COLONOSCOPY N/A 10/22/2014   Procedure: COLONOSCOPY;  Surgeon: Lafayette Dragon, MD;  Location[facility] ENDOSCOPY;  Service: Endoscopy;  Laterality: N/A;  . CORONARY ANGIOPLASTY WITH STENT PLACEMENT     "I have 9 stents"  . ESOPHAGOGASTRODUODENOSCOPY N/A 10/19/2014   Procedure: ESOPHAGOGASTRODUODENOSCOPY (EGD);  Surgeon: Jerene Bears, MD;  Location: [facility] ENDOSCOPY;  Service: Endoscopy;  Laterality: N/A;  . FUSION OF TALONAVICULAR JOINT Right 04/15/2014   dr duda  . HERNIA REPAIR     umbilical  . INGUINAL HERNIA REPAIR Right 05/11/2015   Procedure: LAPAROSCOPIC REPAIR RIGHT  INGUINAL HERNIA;  Surgeon: Greer Pickerel, MD;  Location: [facility];  Service: General;  Laterality: Right;  . INSERTION OF MESH Right 05/11/2015   Procedure: INSERTION OF MESH;  Surgeon: Greer Pickerel, MD;  Location: [facility];  Service: General;  Laterality: Right;  . JOINT REPLACEMENT Bilateral    knees  . KNEE CARTILAGE SURGERY Right X 12   "~ 1/2 open; ~ 1/2 scopes"  . KNEE CARTILAGE SURGERY Left X 3   "3 scopes"  . LEFT HEART CATHETERIZATION WITH CORONARY ANGIOGRAM N/A 02/10/2013   Procedure:  LEFT HEART CATHETERIZATION WITH CORONARY ANGIOGRAM;  Surgeon: Burnell Blanks, MD;  Location: [facility] CATH LAB;  Service: Cardiovascular;  Laterality: N/A;  . LUMBAR LAMINECTOMY/DECOMPRESSION MICRODISCECTOMY N/A 01/27/2014   Procedure: CENTRAL LUMBAR LAMINECTOMY L4-5 AND L3-4;  Surgeon: Jessy Oto, MD;  Location: [facility];  Service: Orthopedics;  Laterality: N/A;  . ORIF ANKLE FRACTURE Right 09/02/2015   Procedure: OPEN REDUCTION INTERNAL FIXATION (ORIF) ANKLE FRACTURE;  Surgeon: Newt Minion, MD;  Location: [facility];  Service: Orthopedics;  Laterality: Right;  . PERIPHERALLY INSERTED CENTRAL CATHETER INSERTION  09/02/2015  . SHOULDER ARTHROSCOPY W/ ROTATOR CUFF REPAIR Bilateral    "3 on the right; 1 on the left"  . SKIN SPLIT GRAFT Right 10/01/2015  Procedure: RIGHT ANKLE APPLY SKIN GRAFT SPLIT THICKNESS;  Surgeon: Newt Minion, MD;  Location: [facility];  Service: Orthopedics;  Laterality: Right;  . TONSILLECTOMY    . TOTAL KNEE ARTHROPLASTY Bilateral 2008  . UMBILICAL HERNIA REPAIR     UHR  . URETHRAL DILATION  X 4  . VASECTOMY      There were no vitals filed for this visit.   Subjective Assessment - 11/01/17 1113    Subjective  The patient had worsening back pain x 2 years with diagnosis of spinal stenosis at lumbar and cervical spine.  Patient notes pain in sitting and in standing.  Pain is constant at a 5/10 and can shoot up to 8/10.  Standing and walking make pain worse and it radiates into the left hip.      Pertinent History  Extensive medical history reviewed in EPIC.  R BKA with postural compensations, h/o back surgery approx 4 years ago, h/o neck surgery.  Bilateral shoulder pain (can't raise R arm due to RTC tear), h/o knee replacements.  Obesity, sleep apnea.     Patient Stated Goals  Improve "the ability to walk and stand without pain."    Currently in Pain?  Yes    Pain Score  5     Pain Location  Back    Pain Orientation  Left    Pain Descriptors / Indicators  Stabbing    Pain  Type  Chronic pain    Pain Radiating Towards  radiates into left hip    Pain Onset  More than a month ago    Pain Frequency  Constant    Aggravating Factors   Worsening over time, standing/walking    Pain Relieving Factors  "I've tried ice, heat. (doesn't work)" Investment banker, operational down helps.         [facility] PT Assessment - 11/01/17 1116      Assessment   Medical Diagnosis  spinal stenosis    Referring Provider  [name], MD    Onset Date/Surgical Date  -- 2 years, worsening condition    Prior Therapy  known to our clinic from prior physical therapy in 2015      Precautions   Precautions  Fall    Required Braces or Orthoses  Other Brace/Splint    Other Brace/Splint  R LE prosthesis      Restrictions   Weight Bearing Restrictions  No      Balance Screen   Has the patient fallen in the past 6 months  No    Has the patient had a decrease in activity level because of a fear of falling?   Yes    Is the patient reluctant to leave their home because of a fear of falling?   Yes due to worsening pain      Home Environment   Living Environment  Private residence    Living Arrangements  -- family (niece)    Type of [place] to enter    Entrance Stairs-Number of Steps  4    Entrance Stairs-Rails  Can reach both    [place]  One level    [place] - single point      Prior Function   Level of Independence  Independent with basic ADLs;Independent with household mobility with device;Independent with community mobility with device    Vocation  On disability    Leisure  Reports has only left house 3x in  the past month      Posture/Postural Control   Posture/Postural Control  Postural limitations    Postural Limitations  Rounded Shoulders;Forward head    Posture Comments  Notes scoliosis x the past few years (not lifetime).  Patinet with lateral scoliosis with increased left side skin folds and shortening through mid to lower thoracic region on left side.   Then has shortening in right lumbar region.  Patient uses cane on right side due to L hip pain.   In standing, L hip is elevated compared to right with lateral instability noted.      ROM / Strength   AROM / PROM / Strength  AROM;Strength      AROM   Overall AROM   Deficits    Overall AROM Comments  R shoulder with approximately 10 degrees of motion.  L UE WFLs.   R LE below knee amputation - hip and knee AROM appears WFL, L LE WFLs.        Strength   Strength Assessment Site  Hip;Knee;Ankle    Right/Left Hip  Right;Left    Right Hip Flexion  4/5    Left Hip Flexion  -- unable to resist due to low back pain    Right/Left Knee  Right;Left    Right Knee Flexion  4/5    Right Knee Extension  4/5    Left Knee Flexion  5/5    Left Knee Extension  5/5    Right/Left Ankle  Left    Left Ankle Dorsiflexion  5/5      Palpation   Spinal mobility  Patient has hypomobility throughout thoracic spine and is tender with gentle palpation along spinous processes.      SI assessment   Patient point tender with L SI palpation to 10/10 pain level.  This is the same location of greatest pain with supine hip flexion >90 degrees.      Special Tests   Other special tests  Supine single knee to chest on left side elicits 62/70 pain in SI region.  R side does not increase pain.   L hip improves with distraction.      Ambulation/Gait   Ambulation/Gait  Yes    Ambulation/Gait Assistance  6: Modified independent (Device/Increase time)    Ambulation Distance (Feet)  200 Feet    Assistive device  Straight cane    Gait Pattern  Decreased step length - right;Step-to pattern;Decreased stride length;Left hip hike;Lateral hip instability;Trendelenburg;Antalgic    Ambulation Surface  Level                Objective measurements completed on examination: See above findings.      [facility] Adult PT Treatment/Exercise - 11/01/17 1116      Exercises   Exercises  Lumbar      Lumbar Exercises: Stretches    Lower Trunk Rotation  5 reps    Pelvic Tilt  5 reps    Other Lumbar Stretch Exercise  Seated trunk flexion x 30 seconds    Other Lumbar Stretch Exercise  Supine contract/relax SI joint muscle energy techniques.             PT Education - 11/01/17 1152    Education Details  HEP: trunk rocking, supine marching, trunk flexion seated       PT Short Term Goals - 11/01/17 2155      PT SHORT TERM GOAL #1   Title  The aptient will be indep with HEP for LE stretching, core  stability, hip strengthening, and general mobilty.    Time  4    Period  Weeks    Status  New    Target Date  12/01/17      PT SHORT TERM GOAL #2   Title  The patient will report resting pain < or equal to 3/10.    Baseline  5/10 at rest    Time  4    Period  Weeks    Status  New    Target Date  12/01/17      PT SHORT TERM GOAL #3   Title  The patient will improve L hip flexion from 3/5 (due to pain) up to 4/5.    Time  4    Period  Weeks    Status  New    Target Date  12/01/17      PT SHORT TERM GOAL #4   Title  The patient will tolerate L knee to chest with L SI region pain < or equal to 5/10 (10/10 today).    Time  4    Period  Weeks    Status  New    Target Date  12/01/17        PT Long Term Goals - 11/01/17 2200      PT LONG TERM GOAL #1   Title  The patient will be further assessed on gait speed and goal to follow.    Time  8    Period  Weeks    Target Date  12/31/17      PT LONG TERM GOAL #2   Title  The patient will be indep with progresssion of HEP.    Time  8    Period  Weeks    Target Date  12/31/17      PT LONG TERM GOAL #3   Title  The patient will verbalize understanding of pain management techniques to manage chronic pain.    Time  8    Period  Weeks    Target Date  12/31/17      PT LONG TERM GOAL #4   Title  The patinet will improve bilateral LE hip flexion to 5/5.    Time  8    Period  Weeks    Target Date  12/31/17             Plan - 11/01/17 2203     Clinical Impression Statement  The patient is a 67 yo male presenting to outpatient physical therapy with h/o spinal stenosis with radiating L hip pain, pain to palpation of the L sacroiliac region, hip weakness, lateral hip instability, postural abnormalities, gait deviations.  Impairments are leading to dec'd engagement with community activities, dec'd IADLs, and declining mobility.  PT to address deficits in order to optimize current functional status.     History and Personal Factors relevant to plan of care:  bilateral total knee arthroplasty, R below knee amputation, sleep apnea, shoulder pain, spinal stenosis    Clinical Presentation  Evolving    Clinical Presentation due to:  worsening pain, 10/10 pain with movement, decreasing community mobility.    Clinical Decision Making  Moderate    Rehab Potential  Good    Clinical Impairments Affecting Rehab Potential  standing posture, gait deviations contribute to back pain and muscle asymmetry    PT Frequency  2x / week    PT Duration  4 weeks + 1x/week for 4 weeks    PT Treatment/Interventions  ADLs/Self Care Home  Management;Therapeutic exercise;Balance training;Neuromuscular re-education;Patient/family education;Gait training;Therapeutic activities;Prosthetic Training;Dry needling;Manual techniques;Cryotherapy;Moist Heat;Traction    PT Next Visit Plan  Check HEP, perform SI muscle energy, hip flexor strengthening, SI mobilization to tolerance, standing trunk flexion leaning with back to wall for support (williams flexion to relieve stenosis pain).  ASSESS gait speed.    Consulted and Agree with Plan of Care  Patient       Patient will benefit from skilled therapeutic intervention in order to improve the following deficits and impairments:  Abnormal gait, Hypomobility, Obesity, Decreased activity tolerance, Decreased mobility, Decreased strength, Pain, Increased muscle spasms, Impaired flexibility, Postural dysfunction  Visit Diagnosis: Low back  pain with sciatica, sciatica laterality unspecified, unspecified back pain laterality, unspecified chronicity  Muscle weakness (generalized)  Other abnormalities of gait and mobility  Abnormal posture     Problem List Patient Active Problem List   Diagnosis Date Noted  . Morbid obesity ([place]) 10/09/2017  . Exposure to sexually transmitted disease (STD) 07/14/2017  . Low back pain 07/13/2017  . Leukoplakia, tongue 01/26/2017  . Acute sinus infection 10/20/2016  . Hyperglycemia 10/20/2016  . Skin lesion 10/20/2016  . S/P unilateral BKA (below knee amputation), right ([place]) 06/14/2016  . Charcot foot due to diabetes mellitus ([place])   . Charcot's arthropathy associated with type 2 diabetes mellitus ([place]) 04/11/2016  . Encounter for well adult exam with abnormal findings 11/05/2015  . Major depression 09/13/2015  . S/P TKR (total knee replacement) bilaterally 09/13/2015  . GERD (gastroesophageal reflux disease) 09/08/2015  . S/P laparoscopic hernia repair 05/11/2015  . PPD positive 04/08/2015  . Benign neoplasm of descending colon   . Benign neoplasm of cecum   . AKI (acute kidney injury) ([place]) 10/18/2014  . Acute blood loss anemia   . Chronic anticoagulation   . Occult blood positive stool 10/17/2014  . General weakness 07/14/2014  . Urinary incontinence 07/14/2014  . Headache(784.0) 10/15/2013  . Spinal stenosis in cervical region 09/26/2013    Class: Chronic  . Spinal stenosis, lumbar region, with neurogenic claudication 09/26/2013    Class: Chronic  . Hand joint pain 06/10/2013  . Rotator cuff tear arthropathy of both shoulders 06/10/2013  . Skin lesion of cheek 05/01/2013  . Pain of right thumb 04/03/2013  . Balance disorder 03/12/2013  . Gait disorder 03/12/2013  . Tremor 03/12/2013  . Left hip pain 03/12/2013  . Pre-ulcerative corn or callous 02/06/2013  . Anxiety 11/12/2011  . OSA (obstructive sleep apnea) 11/07/2011  . Bradycardia 10/20/2011  . Insomnia  10/04/2011  . Obesity 01/12/2011  . ERECTILE DYSFUNCTION, ORGANIC 05/30/2010  . [place] DISEASE, LUMBAR 04/19/2010  . SCIATICA, LEFT 04/19/2010  . Chronic pain syndrome 10/27/2009  . Hyperlipidemia 07/15/2009  . Essential hypertension 06/24/2009  . CAD (coronary artery disease) 06/24/2009  . Allergic rhinitis 06/24/2009  . URETHRAL STRICTURE 06/24/2009  . DEGENERATIVE JOINT DISEASE 06/24/2009  . SHOULDER PAIN, BILATERAL 06/24/2009  . FATIGUE 06/24/2009  . NEPHROLITHIASIS, HX OF 06/24/2009    Georgeann Brinkman, PT 11/01/2017, 10:12 PM  [facility] [address] [place], [place], [address] Phone: [phone]   Fax:  [phone]  Name: Travis Glendening Sr. MRN: [id number] Date of Birth: April 28, 1951

## 2017-11-01 NOTE — Patient Instructions (Signed)
Access Code: [email or web address]  URL: [email or web address]  Date: 11/01/2017  Prepared by: Rudell Cobb   Exercises Seated Lumbar Flexion Stretch - 3 reps - 1 sets - 30-60 seconds hold - 1x daily - 7x weekly Supine March - 10 reps - 2 sets - 1x daily - 7x weekly Supine Lower Trunk Rotation - 10 reps - 1 sets - 5 hold - 1x daily - 7x weekly

## 2017-11-02 ENCOUNTER — Other Ambulatory Visit (INDEPENDENT_AMBULATORY_CARE_PROVIDER_SITE_OTHER): Payer: Self-pay | Admitting: Specialist

## 2017-11-02 NOTE — Telephone Encounter (Signed)
Tylenol #4- refill request

## 2017-11-02 NOTE — Telephone Encounter (Signed)
I called in 1 po q 8 hours prn pain

## 2017-11-05 ENCOUNTER — Ambulatory Visit (INDEPENDENT_AMBULATORY_CARE_PROVIDER_SITE_OTHER): Payer: Medicare HMO | Admitting: Psychology

## 2017-11-05 DIAGNOSIS — F332 Major depressive disorder, recurrent severe without psychotic features: Secondary | ICD-10-CM

## 2017-11-05 DIAGNOSIS — R6889 Other general symptoms and signs: Secondary | ICD-10-CM | POA: Diagnosis not present

## 2017-11-05 DIAGNOSIS — K1379 Other lesions of oral mucosa: Secondary | ICD-10-CM | POA: Diagnosis not present

## 2017-11-09 ENCOUNTER — Encounter (HOSPITAL_COMMUNITY): Payer: Self-pay | Admitting: Psychiatry

## 2017-11-09 ENCOUNTER — Ambulatory Visit (INDEPENDENT_AMBULATORY_CARE_PROVIDER_SITE_OTHER): Payer: Medicare HMO | Admitting: Psychiatry

## 2017-11-09 VITALS — BP 152/68 | HR 75 | Ht 72.0 in | Wt 308.0 lb

## 2017-11-09 DIAGNOSIS — F319 Bipolar disorder, unspecified: Secondary | ICD-10-CM

## 2017-11-09 DIAGNOSIS — R6889 Other general symptoms and signs: Secondary | ICD-10-CM | POA: Diagnosis not present

## 2017-11-09 DIAGNOSIS — F411 Generalized anxiety disorder: Secondary | ICD-10-CM

## 2017-11-09 MED ORDER — DULOXETINE HCL 20 MG PO CPEP: ORAL_CAPSULE | ORAL | 1 refills | Status: DC

## 2017-11-09 NOTE — Progress Notes (Signed)
BH MD/PA/NP OP Progress Note  11/09/2017 12:12 PM Travis Fortis Sr.  MRN:  [id number]  Chief Complaint: I am feeling sad and depressed.  I have no motivation.  HPI: Travis [name] came for his appointment.  He is earlier than his a schedule appointment.  He has noticed in past 2 weeks is been very sad, isolated, withdrawn and has no motivation to do things.  He has a lot of back pain.  He is not sure he may have another back surgery.  He gained weight because he is unable to walk as much.  He is taking his medication but denies any concerns.  He feels sometimes hopeless but denies any suicidal thoughts or homicidal thought.  He is seeing Dr. Cheryln Manly for therapy.  He feel his current medicine not working and he like to change the medication.  In the past he had tried Cymbalta when he was up [place] and he remembered it did work very well for him.  His current medications are Prozac, Klonopin, Abilify and trazodone.  Recently started physical therapy but is not sure it is working.  He also hoping to get evaluated for bariatric surgery.  Patient denies drinking or using any illegal substances.  Visit Diagnosis:    ICD-10-CM   1. Bipolar 1 disorder (HCC) F31.9   2. GAD (generalized anxiety disorder) F41.1 DULoxetine (CYMBALTA) 20 MG capsule    Past Psychiatric History: Reviewed. has history of bipolar disorder and he seeing psychiatrists since age 46. He saw psychiatrist in [place] and [place]. He has history of severe mood swings,anger issues, hallucination, irritability and poor impulse control and excessive spending. He married 5 times and he has 2 children but patient has no contact with them.He has one psychiatric hospitalization in 2014 when he took overdose on Xanax with alcohol. He had history of 2 suicidal gesture in the past. Patient attended intensive outpatient program in the past. He had tried Effexor, Xanax, Klonopin, Ambien, Lunesta, Wellbutrin, Ambien and Remeron. He was started on  Abilify from this office.   Past Medical History:  Past Medical History:  Diagnosis Date  . ALLERGIC RHINITIS 06/24/2009  . Allergic rhinitis 06/24/2009   Qualifier: Diagnosis of  By: [name], [name]   . Anxiety 11/12/2011   Adequate for discharge   . Arthritis    "all my joints" (09/30/2013)  . Arthritis of foot, right, degenerative 04/15/2014  . Balance disorder 03/12/2013  . Benign neoplasm of cecum   . Benign neoplasm of descending colon   . CAD (coronary artery disease) 06/24/2009   5 stents placed in 2007    . Chronic anticoagulation   . Chronic pain syndrome 10/27/2009   of ankle, shoulders, low back.  sciatica.   . Closed fracture of right foot 10/17/2014  . CORONARY ARTERY DISEASE 06/24/2009   a. s/p multiple PCIs - In 2008 he had a Taxus DES to the mild LAD, Endeavor DES to mid LCX and distal LCX. In January 2009 he had DES to distal LCX, mid LCX and proximal LCX. In November 2009 had BMS x 2 to the mid RCA. Cath 10/2011 with patent stents, noncardiac CP. LHC 01/2013: patent stents (noncardiac CP).  . DEGENERATIVE JOINT DISEASE 06/24/2009   Qualifier: Diagnosis of  By: [name], [name]   . Depression   . Depression with anxiety    Prior suicide attempt  . [place] DISEASE, LUMBAR 04/19/2010  . ERECTILE DYSFUNCTION, ORGANIC 05/30/2010  . Essential hypertension 06/24/2009   Qualifier: Diagnosis of  By: [name], [name]   . Fibromyalgia   . Fracture dislocation of ankle joint 09/02/2015  . Gait disorder 03/12/2013  . General weakness 07/14/2014  . GERD (gastroesophageal reflux disease) 09/08/2015  . Hand joint pain 06/10/2013  . Heart murmur   . Hepatitis C   . History of kidney stones   . Hyperlipidemia 07/15/2009   Qualifier: Diagnosis of  By: Aundra Dubin, MD, [place]    . HYPERLIPIDEMIA-MIXED 07/15/2009  . HYPERTENSION 06/24/2009  . Insomnia 10/04/2011  . Irregular heart beat   . Left hip pain 03/12/2013   Injected under ultrasound guidance on June 24, 2013   . Major depression 09/13/2015  .  Myocardial infarction ([place]) 2008  . Non-cardiac chest pain 10/2011, 01/2013  . Obesity   . Occult blood positive stool 10/17/2014  . Open ankle fracture 09/02/2015  . OSA (obstructive sleep apnea)    not using CPAP (09/30/2013)  . Pain of right thumb 04/03/2013  . Pneumonia   . PPD positive 04/08/2015  . Pre-ulcerative corn or callous 02/06/2013  . Restless legs   . Rotator cuff tear arthropathy of both shoulders 06/10/2013   History of bilateral shoulder cuff surgery for rotator cuff tears. Reports increase in pain 09/11/2015 during physical therapy of the left shoulder.   [name] SCIATICA, LEFT 04/19/2010   Qualifier: Diagnosis of  By: [name], [name]   . Spinal stenosis in cervical region 09/26/2013  . Spinal stenosis, lumbar region, with neurogenic claudication 09/26/2013  . Type II diabetes mellitus ([place]) 2012   no meds in 09/2014.   [name] Uncontrolled type 2 DM with peripheral circulatory disorder ([place]) 10/04/2013  . URETHRAL STRICTURE 06/24/2009   self catheterizes.     Past Surgical History:  Procedure Laterality Date  . AMPUTATION Right 06/14/2016   Procedure: AMPUTATION BELOW KNEE;  Surgeon: Newt Minion, MD;  Location: [facility];  Service: Orthopedics;  Laterality: Right;  . ANKLE FUSION Right 04/15/2014   Procedure: Right Subtalar, Talonavicular Fusion;  Surgeon: Newt Minion, MD;  Location: [facility];  Service: Orthopedics;  Laterality: Right;  . ANKLE FUSION Right 04/18/2016   Right Ankle Tibiocalcaneal Fusion; Removal of deep retained hardware; application of wound vac/notes 04/18/2016  . ANKLE FUSION Right 04/18/2016   Procedure: Right Ankle Tibiocalcaneal Fusion;  Surgeon: Newt Minion, MD;  Location: [facility];  Service: Orthopedics;  Laterality: Right;  . ANTERIOR CERVICAL DECOMP/DISCECTOMY FUSION N/A 09/26/2013   Procedure: ANTERIOR CERVICAL DISCECTOMY FUSION C3-4, plate and screw fixation, allograft bone graft;  Surgeon: Jessy Oto, MD;  Location: [facility];  Service: Orthopedics;  Laterality:  N/A;  . BACK SURGERY    . BELOW KNEE LEG AMPUTATION Right 06/14/2016  . CARDIAC CATHETERIZATION  X 1  . CARPAL TUNNEL RELEASE Bilateral   . COLONOSCOPY    . COLONOSCOPY N/A 10/22/2014   Procedure: COLONOSCOPY;  Surgeon: Lafayette Dragon, MD;  Location[facility]COPY;  Service: Endoscopy;  Laterality: N/A;  . CORONARY ANGIOPLASTY WITH STENT PLACEMENT     "I have 9 stents"  . ESOPHAGOGASTRODUODENOSCOPY N/A 10/19/2014   Procedure: ESOPHAGOGASTRODUODENOSCOPY (EGD);  Surgeon: Jerene Bears, MD;  Location: [facility] ENDOSCOPY;  Service: Endoscopy;  Laterality: N/A;  . FUSION OF TALONAVICULAR JOINT Right 04/15/2014   dr duda  . HERNIA REPAIR     umbilical  . INGUINAL HERNIA REPAIR Right 05/11/2015   Procedure: LAPAROSCOPIC REPAIR RIGHT  INGUINAL HERNIA;  Surgeon: Greer Pickerel, MD;  Location: [facility];  Service: General;  Laterality:  Right;  [name] INSERTION OF MESH Right 05/11/2015   Procedure: INSERTION OF MESH;  Surgeon: Greer Pickerel, MD;  Location: [facility];  Service: General;  Laterality: Right;  . JOINT REPLACEMENT Bilateral    knees  . KNEE CARTILAGE SURGERY Right X 12   "~ 1/2 open; ~ 1/2 scopes"  . KNEE CARTILAGE SURGERY Left X 3   "3 scopes"  . LEFT HEART CATHETERIZATION WITH CORONARY ANGIOGRAM N/A 02/10/2013   Procedure: LEFT HEART CATHETERIZATION WITH CORONARY ANGIOGRAM;  Surgeon: Burnell Blanks, MD;  Location: [facility] CATH LAB;  Service: Cardiovascular;  Laterality: N/A;  . LUMBAR LAMINECTOMY/DECOMPRESSION MICRODISCECTOMY N/A 01/27/2014   Procedure: CENTRAL LUMBAR LAMINECTOMY L4-5 AND L3-4;  Surgeon: Jessy Oto, MD;  Location: [facility];  Service: Orthopedics;  Laterality: N/A;  . ORIF ANKLE FRACTURE Right 09/02/2015   Procedure: OPEN REDUCTION INTERNAL FIXATION (ORIF) ANKLE FRACTURE;  Surgeon: Newt Minion, MD;  Location: [facility];  Service: Orthopedics;  Laterality: Right;  . PERIPHERALLY INSERTED CENTRAL CATHETER INSERTION  09/02/2015  . SHOULDER ARTHROSCOPY W/ ROTATOR CUFF REPAIR Bilateral    "3 on the  right; 1 on the left"  . SKIN SPLIT GRAFT Right 10/01/2015   Procedure: RIGHT ANKLE APPLY SKIN GRAFT SPLIT THICKNESS;  Surgeon: Newt Minion, MD;  Location: [facility];  Service: Orthopedics;  Laterality: Right;  . TONSILLECTOMY    . TOTAL KNEE ARTHROPLASTY Bilateral 2008  . UMBILICAL HERNIA REPAIR     UHR  . URETHRAL DILATION  X 4  . VASECTOMY      Family Psychiatric History:.  Family History:  Family History  Problem Relation Age of Onset  . Depression Mother   . Heart disease Mother   . Hypertension Mother   . Cancer Mother        Breast  . Diabetes Father   . Heart disease Father        CABG  . Cancer Father        prostate and skin cancer  . Hypertension Father   . Hyperlipidemia Father   . Depression Brother        x 2  . Hypertension Brother        x2  . Heart disease Maternal Grandfather   . Early death Maternal Grandfather 39       heart attack  . Early death Paternal Grandfather   . Coronary artery disease Other   . Hypertension Other   . Depression Other     Social History:  Social History   Socioeconomic History  . Marital status: Divorced    Spouse name: Not on file  . Number of children: 2  . Years of education: Not on file  . Highest education level: Not on file  Occupational History  . Occupation: disabled since 2006 due to ortho. heart, psych    Employer: UNEMPLOYED  . Occupation: part time work as an Multimedia programmer, wrestling, and Holiday representative  Social Needs  . Financial resource strain: Not on file  . Food insecurity:    Worry: Not on file    Inability: Not on file  . Transportation needs:    Medical: Not on file    Non-medical: Not on file  Tobacco Use  . Smoking status: Former Smoker    Types: Cigars    Last attempt to quit: 08/28/2010    Years since quitting: 7.2  . Smokeless tobacco: Never Used  . Tobacco comment: 04/18/2016 "smoked 1 cigar/wk when I did smoke"  Substance and Sexual Activity  . Alcohol use:  No    Alcohol/week: 0.0 oz    Comment: rarely  . Drug use: No  . Sexual activity: Not Currently  Lifestyle  . Physical activity:    Days per week: Not on file    Minutes per session: Not on file  . Stress: Not on file  Relationships  . Social connections:    Talks on phone: Not on file    Gets together: Not on file    Attends religious service: Not on file    Active member of club or organization: Not on file    Attends meetings of clubs or organizations: Not on file    Relationship status: Not on file  Other Topics Concern  . Not on file  Social History Narrative   Played semi-pro football, used steroids   Divorced moved here from [place], [place]   Patient states he has been on disability since his knee surgery.      03/05/2013 AHW "[name]" was born and grew up in [place], [place], and moved to [place], [place] at age 89. He has one sister and 3 brothers. He reports that his childhood was rough. His parents are deceased. He graduated from [employer], and attended one year of college. He is currently unemployed and on disability for multiple medical problems. He has been married 5 times. He has 2 sons. He currently lives alone. 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 He denies that he has any social support system. 03/05/2013 AHW          Allergies: No Known Allergies  Metabolic Disorder Labs: Lab Results  Component Value Date   HGBA1C 5.4 07/13/2017   MPG 105 04/18/2016   MPG 114 09/26/2013   No results found for: PROLACTIN Lab Results  Component Value Date   CHOL 112 07/13/2017   TRIG 77.0 07/13/2017   HDL 38.10 (L) 07/13/2017   CHOLHDL 3 07/13/2017   VLDL 15.4 07/13/2017   LDLCALC 59 07/13/2017   LDLCALC 88 03/01/2017   Lab Results  Component Value Date   TSH 3.00 03/01/2017   TSH 2.56 11/10/2015    Therapeutic Level Labs: No results found for: LITHIUM Lab Results  Component Value Date   VALPROATE 46.3 (L)  07/14/2014   No components found for:  CBMZ  Current Medications: Current Outpatient Medications  Medication Sig Dispense Refill  . acetaminophen-codeine (TYLENOL #4) 300-60 MG tablet TAKE ONE TABLET BY MOUTH EVERY HOUR AS NEEDED MODERATE FOR PAIN 40 tablet 0  . ARIPiprazole (ABILIFY) 5 MG tablet Take 1 tablet (5 mg total) by mouth daily. Take one tablet at bedtime 90 tablet 0  . aspirin 81 MG tablet Take 81 mg by mouth daily.    [name] atorvastatin (LIPITOR) 20 MG tablet Take 1 tablet (20 mg total) daily by mouth. 90 tablet 2  . benztropine (COGENTIN) 0.5 MG tablet Take 1 tablet (0.5 mg total) by mouth at bedtime. 90 tablet 0  . clonazePAM (KLONOPIN) 0.5 MG tablet Take 1 tablet (0.5 mg total) by mouth at bedtime. 30 tablet 5  . cyclobenzaprine (FLEXERIL) 5 MG tablet take 1 TABLET BY MOUTH THREE TIMES DAILY AS NEEDED for muscle spasms] 90 tablet 3  . FLUoxetine (PROZAC) 40 MG capsule TAKE 1 CAPSULE(40 MG) BY MOUTH DAILY 90 capsule 0  . IBU 800 MG tablet Take 1 tablet (800 mg total) by mouth every 6 (six) hours as needed. 60 tablet 0  .  mirabegron ER (MYRBETRIQ) 25 MG TB24 tablet Take 25 mg by mouth daily.    . Omega-3 Fatty Acids (FISH OIL) 1000 MG CAPS Take 1,000 mg by mouth daily.     . pantoprazole (PROTONIX) 40 MG tablet Take 1 TABLET BY MOUTH EVERY DAY 30 tablet 3  . prasugrel (EFFIENT) 5 MG TABS tablet TAKE ONE TABLET BY MOUTH EVERY DAY 90 tablet 3  . pregabalin (LYRICA) 75 MG capsule Take 1 capsule (75 mg total) by mouth 2 (two) times daily. 60 capsule 3  . tamsulosin (FLOMAX) 0.4 MG CAPS capsule Take 0.4 mg by mouth daily after breakfast.     . traMADol (ULTRAM) 50 MG tablet Take 1 tablet (50 mg total) by mouth every 6 (six) hours as needed for severe pain. 30 tablet 3  . traZODone (DESYREL) 150 MG tablet Take 1 tablet (150 mg total) by mouth at bedtime. 90 tablet 0  . triamcinolone (NASACORT AQ) 55 MCG/ACT AERO nasal inhaler Place 2 sprays into the nose daily. 1 Inhaler 12   No current  facility-administered medications for this visit.      Musculoskeletal: Strength & Muscle Tone: decreased Gait & Station: unsteady Patient leans: N/A  Psychiatric Specialty Exam: ROS  Blood pressure (!) 152/68, pulse 75, height 6' (1.829 m), weight (!) 308 lb (139.7 kg).There is no height or weight on file to calculate BMI.  General Appearance: Fairly Groomed  Eye Contact:  Fair  Speech:  Clear and Coherent  Volume:  Normal  Mood:  Anxious and Dysphoric  Affect:  Constricted  Thought Process:  Goal Directed  Orientation:  Full (Time, Place, and Person)  Thought Content: Rumination   Suicidal Thoughts:  No  Homicidal Thoughts:  No  Memory:  Immediate;   Fair Recent;   Good Remote;   Good  Judgement:  Good  Insight:  Good  Psychomotor Activity:  Decreased  Concentration:  Concentration: Fair and Attention Span: Fair  Recall:  Good  Fund of Knowledge: Good  Language: Good  Akathisia:  No  Handed:  Right  AIMS (if indicated): not done  Assets:  Communication Skills Desire for Improvement Housing Resilience Social Support  ADL's:  Intact  Cognition: WNL  Sleep:  Fair   Screenings: AUDIT     Admission (Discharged) from OP Visit from 06/25/2012 in [facility]  Alcohol Use Disorder Identification Test Final Score (AUDIT)  1    PHQ2-9     Office Visit from 11/05/2015 in [facility] Patient Outreach from 09/15/2015 in [facility] Visit from 07/14/2015 in [facility] Office Visit from 03/11/2015 in [facility] Visit from 09/09/2014 in [facility]  PHQ-2 Total Score  0  2  0  0  1  PHQ-9 Total Score  -  5  -  -  -       Assessment and Plan: Bipolar disorder type I.  Generalized anxiety disorder.  Patient is feeling depressed more than usual.  He has been isolated, withdrawn and lack of motivation.  In  the past he had tried Cymbalta.  Recommended to try Cymbalta 20 mg daily for 1 week and then 40 mg daily.  He will discontinue Prozac after taking 1 capsule every other day for 1 week.  Continue trazodone 150 mg at bedtime, Cogentin 0.5 mg at bedtime and Abilify 5 mg daily.  Is getting Klonopin from his primary  care physician.  Encouraged to keep appointment with Dr. Cheryln Manly.  Recommended to call us back if he has any question or any concern.  Discussed safety concerns at any time having active suicidal thoughts or homicidal thought that he need to call 911 or go to local emergency room.  Follow-up in 4 to 6 weeks.   Kathlee Nations, MD 11/09/2017, 12:13 PM

## 2017-11-12 ENCOUNTER — Ambulatory Visit (INDEPENDENT_AMBULATORY_CARE_PROVIDER_SITE_OTHER): Payer: Self-pay

## 2017-11-12 ENCOUNTER — Encounter (INDEPENDENT_AMBULATORY_CARE_PROVIDER_SITE_OTHER): Payer: Self-pay | Admitting: Specialist

## 2017-11-12 ENCOUNTER — Telehealth (INDEPENDENT_AMBULATORY_CARE_PROVIDER_SITE_OTHER): Payer: Self-pay | Admitting: Specialist

## 2017-11-12 ENCOUNTER — Ambulatory Visit (INDEPENDENT_AMBULATORY_CARE_PROVIDER_SITE_OTHER): Payer: Medicare HMO | Admitting: Specialist

## 2017-11-12 VITALS — BP 143/66 | HR 47 | Ht 72.0 in | Wt 304.0 lb

## 2017-11-12 DIAGNOSIS — M4804 Spinal stenosis, thoracic region: Secondary | ICD-10-CM | POA: Diagnosis not present

## 2017-11-12 DIAGNOSIS — M4155 Other secondary scoliosis, thoracolumbar region: Secondary | ICD-10-CM | POA: Diagnosis not present

## 2017-11-12 DIAGNOSIS — M48062 Spinal stenosis, lumbar region with neurogenic claudication: Secondary | ICD-10-CM | POA: Diagnosis not present

## 2017-11-12 DIAGNOSIS — M5136 Other intervertebral disc degeneration, lumbar region: Secondary | ICD-10-CM

## 2017-11-12 DIAGNOSIS — R6889 Other general symptoms and signs: Secondary | ICD-10-CM | POA: Diagnosis not present

## 2017-11-12 MED ORDER — HYDROCODONE-ACETAMINOPHEN 10-325 MG PO TABS: 1.0000 | ORAL_TABLET | Freq: Four times a day (QID) | ORAL | 0 refills | Status: AC | PRN

## 2017-11-12 NOTE — Patient Instructions (Addendum)
Avoid bending, stooping and avoid lifting weights greater than 10 lbs. Avoid prolong standing and walking. Avoid frequent bending and stooping  No lifting greater than 10 lbs. May use ice or moist heat for pain. Weight loss is of benefit. Handicap license is approved. Myelogram and post myelogram CT scan of the thoracolumbar spine. Also standing scoliosis Radiographs of the entire lumbar spine for preoperative assessment.

## 2017-11-12 NOTE — Telephone Encounter (Signed)
Patient needs to see Dr. Louanne Skye in 3 weeks.  Patient has an appointment for 12/21/17 @10 :45.  Please place him on your cancellation list and he also needs a 3 day notice for transportation.  CB#[phone].  Thank you.

## 2017-11-12 NOTE — Progress Notes (Signed)
Office Visit Note   Patient: Travis Diebel Sr.           Date of Birth: 08-18-1950           MRN: [id number] Visit Date: 11/12/2017              Requested by: [name], MD [place], [place] [address] PCP: [name], MD   Assessment & Plan: Visit Diagnoses:  1. Spinal stenosis of lumbar region with neurogenic claudication   2. Spinal stenosis of thoracic region   3. Other secondary scoliosis, thoracolumbar region   4. Degenerative disc disease, lumbar     Plan: Avoid bending, stooping and avoid lifting weights greater than 10 lbs. Avoid prolong standing and walking. Avoid frequent bending and stooping  No lifting greater than 10 lbs. May use ice or moist heat for pain. Weight loss is of benefit. Handicap license is approved.   Follow-Up Instructions: Return in about 3 years (around 11/12/2020).   Orders:  No orders of the defined types were placed in this encounter.  No orders of the defined types were placed in this encounter.     Procedures: No procedures performed   Clinical Data: No additional findings.   Subjective: Chief Complaint  Patient presents with  . Lower Back - Pain    67 year old male with cervical and lumbar spondylosis and stenosis changes, last MRI 08/2017 with multilevel lumbar degenerative disc disease. There is mild foramenal entrapment L4-5. There is no leg pain only pain in the back, any movement brings severe pain into the left hip and buttock. Pain is present when he coughs or sneezes. No bowel or bladder difficulty. He is not able to walk even 100 feet he feels like the left leg is weak and he might fall. Presently taking cymbalta, is taking meds for a blockage in his urethra, take trazadone and clonopin at night and 2 meds for his heart, one an anticoagulation. He takes tylenol #4 and ibuprofen and clonopin and trazadone at night. He is at the point were he can not walk well.    Review of Systems    Constitutional: Negative.   HENT: Negative.   Eyes: Negative.   Respiratory: Negative.   Cardiovascular: Negative.   Gastrointestinal: Negative.   Endocrine: Negative.   Genitourinary: Negative.   Musculoskeletal: Negative.   Skin: Negative.   Allergic/Immunologic: Negative.   Neurological: Negative.   Hematological: Negative.   Psychiatric/Behavioral: Negative.      Objective: Vital Signs: BP (!) 143/66   Pulse (!) 47   Ht 6' (1.829 m)   Wt (!) 304 lb (137.9 kg)   BMI 41.23 kg/m   Physical Exam  Constitutional: He is oriented to person, place, and time. He appears well-developed and well-nourished.  HENT:  Head: Normocephalic and atraumatic.  Eyes: Pupils are equal, round, and reactive to light. EOM are normal.  Neck: Normal range of motion. Neck supple.  Pulmonary/Chest: Effort normal and breath sounds normal.  Abdominal: Soft. Bowel sounds are normal.  Musculoskeletal: Normal range of motion.  Neurological: He is alert and oriented to person, place, and time.  Skin: Skin is warm and dry.  Psychiatric: He has a normal mood and affect. His behavior is normal. Judgment and thought content normal.    Back Exam   Tenderness  The patient is experiencing tenderness in the lumbar.  Range of Motion  Lateral bend right: normal  Lateral bend left: normal  Rotation right: normal  Rotation left: normal   Muscle Strength  Right Quadriceps:  5/5  Left Quadriceps:  5/5  Right Hamstrings:  5/5  Left Hamstrings:  5/5   Tests  Straight leg raise right: negative Straight leg raise left: negative  Reflexes  Patellar: normal Achilles: normal Biceps: normal Babinski's sign: normal   Other  Toe walk: abnormal Heel walk: abnormal Sensation: decreased Erythema: no back redness Scars: absent      Specialty Comments:  No specialty comments available.  Imaging: No results found.   PMFS History: Patient Active Problem List   Diagnosis Date Noted  . Spinal  stenosis in cervical region 09/26/2013    Priority: High    Class: Chronic  . Spinal stenosis, lumbar region, with neurogenic claudication 09/26/2013    Priority: High    Class: Chronic  . Morbid obesity ([place]) 10/09/2017  . Exposure to sexually transmitted disease (STD) 07/14/2017  . Low back pain 07/13/2017  . Leukoplakia, tongue 01/26/2017  . Acute sinus infection 10/20/2016  . Hyperglycemia 10/20/2016  . Skin lesion 10/20/2016  . S/P unilateral BKA (below knee amputation), right ([place]) 06/14/2016  . Charcot foot due to diabetes mellitus ([place])   . Charcot's arthropathy associated with type 2 diabetes mellitus ([place]) 04/11/2016  . Encounter for well adult exam with abnormal findings 11/05/2015  . Major depression 09/13/2015  . S/P TKR (total knee replacement) bilaterally 09/13/2015  . GERD (gastroesophageal reflux disease) 09/08/2015  . S/P laparoscopic hernia repair 05/11/2015  . PPD positive 04/08/2015  . Benign neoplasm of descending colon   . Benign neoplasm of cecum   . AKI (acute kidney injury) ([place]) 10/18/2014  . Acute blood loss anemia   . Chronic anticoagulation   . Occult blood positive stool 10/17/2014  . General weakness 07/14/2014  . Urinary incontinence 07/14/2014  . Headache(784.0) 10/15/2013  . Hand joint pain 06/10/2013  . Rotator cuff tear arthropathy of both shoulders 06/10/2013  . Skin lesion of cheek 05/01/2013  . Pain of right thumb 04/03/2013  . Balance disorder 03/12/2013  . Gait disorder 03/12/2013  . Tremor 03/12/2013  . Left hip pain 03/12/2013  . Pre-ulcerative corn or callous 02/06/2013  . Anxiety 11/12/2011  . OSA (obstructive sleep apnea) 11/07/2011  . Bradycardia 10/20/2011  . Insomnia 10/04/2011  . Obesity 01/12/2011  . ERECTILE DYSFUNCTION, ORGANIC 05/30/2010  . [place] DISEASE, LUMBAR 04/19/2010  . SCIATICA, LEFT 04/19/2010  . Chronic pain syndrome 10/27/2009  . Hyperlipidemia 07/15/2009  . Essential hypertension 06/24/2009  . CAD  (coronary artery disease) 06/24/2009  . Allergic rhinitis 06/24/2009  . URETHRAL STRICTURE 06/24/2009  . DEGENERATIVE JOINT DISEASE 06/24/2009  . SHOULDER PAIN, BILATERAL 06/24/2009  . FATIGUE 06/24/2009  . NEPHROLITHIASIS, HX OF 06/24/2009   Past Medical History:  Diagnosis Date  . ALLERGIC RHINITIS 06/24/2009  . Allergic rhinitis 06/24/2009   Qualifier: Diagnosis of  By: [name], [name]   . Anxiety 11/12/2011   Adequate for discharge   . Arthritis    "all my joints" (09/30/2013)  . Arthritis of foot, right, degenerative 04/15/2014  . Balance disorder 03/12/2013  . Benign neoplasm of cecum   . Benign neoplasm of descending colon   . CAD (coronary artery disease) 06/24/2009   5 stents placed in 2007    . Chronic anticoagulation   . Chronic pain syndrome 10/27/2009   of ankle, shoulders, low back.  sciatica.   . Closed fracture of right foot 10/17/2014  . CORONARY ARTERY DISEASE 06/24/2009  a. s/p multiple PCIs - In 2008 he had a Taxus DES to the mild LAD, Endeavor DES to mid LCX and distal LCX. In January 2009 he had DES to distal LCX, mid LCX and proximal LCX. In November 2009 had BMS x 2 to the mid RCA. Cath 10/2011 with patent stents, noncardiac CP. LHC 01/2013: patent stents (noncardiac CP).  . DEGENERATIVE JOINT DISEASE 06/24/2009   Qualifier: Diagnosis of  By: [name], [name]   . Depression   . Depression with anxiety    Prior suicide attempt  . [place] DISEASE, LUMBAR 04/19/2010  . ERECTILE DYSFUNCTION, ORGANIC 05/30/2010  . Essential hypertension 06/24/2009   Qualifier: Diagnosis of  By: [name], [name]   . Fibromyalgia   . Fracture dislocation of ankle joint 09/02/2015  . Gait disorder 03/12/2013  . General weakness 07/14/2014  . GERD (gastroesophageal reflux disease) 09/08/2015  . Hand joint pain 06/10/2013  . Heart murmur   . Hepatitis C   . History of kidney stones   . Hyperlipidemia 07/15/2009   Qualifier: Diagnosis of  By: Aundra Dubin, MD, [place]    . HYPERLIPIDEMIA-MIXED 07/15/2009    . HYPERTENSION 06/24/2009  . Insomnia 10/04/2011  . Irregular heart beat   . Left hip pain 03/12/2013   Injected under ultrasound guidance on June 24, 2013   . Major depression 09/13/2015  . Myocardial infarction ([place]) 2008  . Non-cardiac chest pain 10/2011, 01/2013  . Obesity   . Occult blood positive stool 10/17/2014  . Open ankle fracture 09/02/2015  . OSA (obstructive sleep apnea)    not using CPAP (09/30/2013)  . Pain of right thumb 04/03/2013  . Pneumonia   . PPD positive 04/08/2015  . Pre-ulcerative corn or callous 02/06/2013  . Restless legs   . Rotator cuff tear arthropathy of both shoulders 06/10/2013   History of bilateral shoulder cuff surgery for rotator cuff tears. Reports increase in pain 09/11/2015 during physical therapy of the left shoulder.   [name] SCIATICA, LEFT 04/19/2010   Qualifier: Diagnosis of  By: [name], [name]   . Spinal stenosis in cervical region 09/26/2013  . Spinal stenosis, lumbar region, with neurogenic claudication 09/26/2013  . Type II diabetes mellitus ([place]) 2012   no meds in 09/2014.   [name] Uncontrolled type 2 DM with peripheral circulatory disorder ([place]) 10/04/2013  . URETHRAL STRICTURE 06/24/2009   self catheterizes.     Family History  Problem Relation Age of Onset  . Depression Mother   . Heart disease Mother   . Hypertension Mother   . Cancer Mother        Breast  . Diabetes Father   . Heart disease Father        CABG  . Cancer Father        prostate and skin cancer  . Hypertension Father   . Hyperlipidemia Father   . Depression Brother        x 2  . Hypertension Brother        x2  . Heart disease Maternal Grandfather   . Early death Maternal Grandfather 59       heart attack  . Early death Paternal Grandfather   . Coronary artery disease Other   . Hypertension Other   . Depression Other     Past Surgical History:  Procedure Laterality Date  . AMPUTATION Right 06/14/2016   Procedure: AMPUTATION BELOW KNEE;  Surgeon: Newt Minion, MD;   Location: [facility];  Service: Orthopedics;  Laterality: Right;  . ANKLE FUSION Right 04/15/2014   Procedure: Right Subtalar, Talonavicular Fusion;  Surgeon: Newt Minion, MD;  Location: [facility];  Service: Orthopedics;  Laterality: Right;  . ANKLE FUSION Right 04/18/2016   Right Ankle Tibiocalcaneal Fusion; Removal of deep retained hardware; application of wound vac/notes 04/18/2016  . ANKLE FUSION Right 04/18/2016   Procedure: Right Ankle Tibiocalcaneal Fusion;  Surgeon: Newt Minion, MD;  Location: [facility];  Service: Orthopedics;  Laterality: Right;  . ANTERIOR CERVICAL DECOMP/DISCECTOMY FUSION N/A 09/26/2013   Procedure: ANTERIOR CERVICAL DISCECTOMY FUSION C3-4, plate and screw fixation, allograft bone graft;  Surgeon: Jessy Oto, MD;  Location: [facility];  Service: Orthopedics;  Laterality: N/A;  . BACK SURGERY    . BELOW KNEE LEG AMPUTATION Right 06/14/2016  . CARDIAC CATHETERIZATION  X 1  . CARPAL TUNNEL RELEASE Bilateral   . COLONOSCOPY    . COLONOSCOPY N/A 10/22/2014   Procedure: COLONOSCOPY;  Surgeon: Lafayette Dragon, MD;  Location[facility] ENDOSCOPY;  Service: Endoscopy;  Laterality: N/A;  . CORONARY ANGIOPLASTY WITH STENT PLACEMENT     "I have 9 stents"  . ESOPHAGOGASTRODUODENOSCOPY N/A 10/19/2014   Procedure: ESOPHAGOGASTRODUODENOSCOPY (EGD);  Surgeon: Jerene Bears, MD;  Location[facility] ENDOSCOPY;  Service: Endoscopy;  Laterality: N/A;  . FUSION OF TALONAVICULAR JOINT Right 04/15/2014   dr duda  . HERNIA REPAIR     umbilical  . INGUINAL HERNIA REPAIR Right 05/11/2015   Procedure: LAPAROSCOPIC REPAIR RIGHT  INGUINAL HERNIA;  Surgeon: Greer Pickerel, MD;  Location: [facility];  Service: General;  Laterality: Right;  . INSERTION OF MESH Right 05/11/2015   Procedure: INSERTION OF MESH;  Surgeon: Greer Pickerel, MD;  Location: [facility];  Service: General;  Laterality: Right;  . JOINT REPLACEMENT Bilateral    knees  . KNEE CARTILAGE SURGERY Right X 12   "~ 1/2 open; ~ 1/2 scopes"  . KNEE CARTILAGE SURGERY Left X 3    "3 scopes"  . LEFT HEART CATHETERIZATION WITH CORONARY ANGIOGRAM N/A 02/10/2013   Procedure: LEFT HEART CATHETERIZATION WITH CORONARY ANGIOGRAM;  Surgeon: Burnell Blanks, MD;  Location[facility] C[facility] LAB;  Service: Cardiovascular;  Laterality: N/A;  . LUMBAR LAMINECTOMY/DECOMPRESSION MICRODISCECTOMY N/A 01/27/2014   Procedure: CENTRAL LUMBAR LAMINECTOMY L4-5 AND L3-4;  Surgeon: Jessy Oto, MD;  Location: [facility];  Service: Orthopedics;  Laterality: N/A;  . ORIF ANKLE FRACTURE Right 09/02/2015   Procedure: OPEN REDUCTION INTERNAL FIXATION (ORIF) ANKLE FRACTURE;  Surgeon: Newt Minion, MD;  Location: [facility];  Service: Orthopedics;  Laterality: Right;  . PERIPHERALLY INSERTED CENTRAL CATHETER INSERTION  09/02/2015  . SHOULDER ARTHROSCOPY W/ ROTATOR CUFF REPAIR Bilateral    "3 on the right; 1 on the left"  . SKIN SPLIT GRAFT Right 10/01/2015   Procedure: RIGHT ANKLE APPLY SKIN GRAFT SPLIT THICKNESS;  Surgeon: Newt Minion, MD;  Location: [facility];  Service: Orthopedics;  Laterality: Right;  . TONSILLECTOMY    . TOTAL KNEE ARTHROPLASTY Bilateral 2008  . UMBILICAL HERNIA REPAIR     UHR  . URETHRAL DILATION  X 4  . VASECTOMY     Social History   Occupational History  . Occupation: disabled since 2006 due to ortho. heart, psych    Employer: UNEMPLOYED  . Occupation: part time work as an Multimedia programmer, wrestling, and Holiday representative  Tobacco Use  . Smoking status: Former Smoker    Types: Cigars    Last attempt to quit: 08/28/2010  Years since quitting: 7.2  . Smokeless tobacco: Never Used  . Tobacco comment: 04/18/2016 "smoked 1 cigar/wk when I did smoke"  Substance and Sexual Activity  . Alcohol use: No    Alcohol/week: 0.0 oz    Comment: rarely  . Drug use: No  . Sexual activity: Not Currently

## 2017-11-13 ENCOUNTER — Ambulatory Visit (INDEPENDENT_AMBULATORY_CARE_PROVIDER_SITE_OTHER): Payer: Medicare HMO | Admitting: Psychology

## 2017-11-13 DIAGNOSIS — F332 Major depressive disorder, recurrent severe without psychotic features: Secondary | ICD-10-CM

## 2017-11-13 DIAGNOSIS — R6889 Other general symptoms and signs: Secondary | ICD-10-CM | POA: Diagnosis not present

## 2017-11-13 NOTE — Telephone Encounter (Signed)
Put him on the cancellation list

## 2017-11-15 ENCOUNTER — Ambulatory Visit: Payer: Medicare HMO

## 2017-11-15 DIAGNOSIS — M5440 Lumbago with sciatica, unspecified side: Secondary | ICD-10-CM

## 2017-11-15 DIAGNOSIS — R293 Abnormal posture: Secondary | ICD-10-CM | POA: Diagnosis not present

## 2017-11-15 DIAGNOSIS — M6281 Muscle weakness (generalized): Secondary | ICD-10-CM | POA: Diagnosis not present

## 2017-11-15 DIAGNOSIS — R6889 Other general symptoms and signs: Secondary | ICD-10-CM | POA: Diagnosis not present

## 2017-11-15 DIAGNOSIS — R2689 Other abnormalities of gait and mobility: Secondary | ICD-10-CM

## 2017-11-15 NOTE — Therapy (Signed)
[facility] [address] [place], [place], [address] Phone: [phone]   Fax:  [phone]  Physical Therapy Treatment  Patient Details  Name: Travis Straughter Sr. MRN: [id number] Date of Birth: 1950/09/25 Referring Provider: [name], MD   Encounter Date: 11/15/2017  PT End of Session - 11/15/17 1143    Visit Number  2    Number of Visits  12    Date for PT Re-Evaluation  12/31/17    Authorization Type  [employer] medicare and medicaid    PT Start Time  1101    PT Stop Time  1141    PT Time Calculation (min)  40 min    Activity Tolerance  Patient limited by pain;No increased pain    Behavior During Therapy  WFL for tasks assessed/performed       Past Medical History:  Diagnosis Date  . ALLERGIC RHINITIS 06/24/2009  . Allergic rhinitis 06/24/2009   Qualifier: Diagnosis of  By: [name], [name]   . Anxiety 11/12/2011   Adequate for discharge   . Arthritis    "all my joints" (09/30/2013)  . Arthritis of foot, right, degenerative 04/15/2014  . Balance disorder 03/12/2013  . Benign neoplasm of cecum   . Benign neoplasm of descending colon   . CAD (coronary artery disease) 06/24/2009   5 stents placed in 2007    . Chronic anticoagulation   . Chronic pain syndrome 10/27/2009   of ankle, shoulders, low back.  sciatica.   . Closed fracture of right foot 10/17/2014  . CORONARY ARTERY DISEASE 06/24/2009   a. s/p multiple PCIs - In 2008 he had a Taxus DES to the mild LAD, Endeavor DES to mid LCX and distal LCX. In January 2009 he had DES to distal LCX, mid LCX and proximal LCX. In November 2009 had BMS x 2 to the mid RCA. Cath 10/2011 with patent stents, noncardiac CP. LHC 01/2013: patent stents (noncardiac CP).  . DEGENERATIVE JOINT DISEASE 06/24/2009   Qualifier: Diagnosis of  By: [name], [name]   . Depression   . Depression with anxiety    Prior suicide attempt  . [place] DISEASE, LUMBAR 04/19/2010  . ERECTILE DYSFUNCTION, ORGANIC  05/30/2010  . Essential hypertension 06/24/2009   Qualifier: Diagnosis of  By: [name], [name]   . Fibromyalgia   . Fracture dislocation of ankle joint 09/02/2015  . Gait disorder 03/12/2013  . General weakness 07/14/2014  . GERD (gastroesophageal reflux disease) 09/08/2015  . Hand joint pain 06/10/2013  . Heart murmur   . Hepatitis C   . History of kidney stones   . Hyperlipidemia 07/15/2009   Qualifier: Diagnosis of  By: Aundra Dubin, MD, [place]    . HYPERLIPIDEMIA-MIXED 07/15/2009  . HYPERTENSION 06/24/2009  . Insomnia 10/04/2011  . Irregular heart beat   . Left hip pain 03/12/2013   Injected under ultrasound guidance on June 24, 2013   . Major depression 09/13/2015  . Myocardial infarction ([employer]) 2008  . Non-cardiac chest pain 10/2011, 01/2013  . Obesity   . Occult blood positive stool 10/17/2014  . Open ankle fracture 09/02/2015  . OSA (obstructive sleep apnea)    not using CPAP (09/30/2013)  . Pain of right thumb 04/03/2013  . Pneumonia   . PPD positive 04/08/2015  . Pre-ulcerative corn or callous 02/06/2013  . Restless legs   . Rotator cuff tear arthropathy of both shoulders 06/10/2013   History of bilateral shoulder cuff surgery for rotator cuff  tears. Reports increase in pain 09/11/2015 during physical therapy of the left shoulder.   [name] SCIATICA, LEFT 04/19/2010   Qualifier: Diagnosis of  By: [name], [name]   . Spinal stenosis in cervical region 09/26/2013  . Spinal stenosis, lumbar region, with neurogenic claudication 09/26/2013  . Type II diabetes mellitus ([place]) 2012   no meds in 09/2014.   [name] Uncontrolled type 2 DM with peripheral circulatory disorder ([place]) 10/04/2013  . URETHRAL STRICTURE 06/24/2009   self catheterizes.     Past Surgical History:  Procedure Laterality Date  . AMPUTATION Right 06/14/2016   Procedure: AMPUTATION BELOW KNEE;  Surgeon: Newt Minion, MD;  Location: [facility];  Service: Orthopedics;  Laterality: Right;  . ANKLE FUSION Right 04/15/2014   Procedure: Right  Subtalar, Talonavicular Fusion;  Surgeon: Newt Minion, MD;  Location: [facility];  Service: Orthopedics;  Laterality: Right;  . ANKLE FUSION Right 04/18/2016   Right Ankle Tibiocalcaneal Fusion; Removal of deep retained hardware; application of wound vac/notes 04/18/2016  . ANKLE FUSION Right 04/18/2016   Procedure: Right Ankle Tibiocalcaneal Fusion;  Surgeon: Newt Minion, MD;  Location: [facility];  Service: Orthopedics;  Laterality: Right;  . ANTERIOR CERVICAL DECOMP/DISCECTOMY FUSION N/A 09/26/2013   Procedure: ANTERIOR CERVICAL DISCECTOMY FUSION C3-4, plate and screw fixation, allograft bone graft;  Surgeon: Jessy Oto, MD;  Location: [facility];  Service: Orthopedics;  Laterality: N/A;  . BACK SURGERY    . BELOW KNEE LEG AMPUTATION Right 06/14/2016  . CARDIAC CATHETERIZATION  X 1  . CARPAL TUNNEL RELEASE Bilateral   . COLONOSCOPY    . COLONOSCOPY N/A 10/22/2014   Procedure: COLONOSCOPY;  Surgeon: Lafayette Dragon, MD;  Location: [facility] ENDOSCOPY;  Service: Endoscopy;  Laterality: N/A;  . CORONARY ANGIOPLASTY WITH STENT PLACEMENT     "I have 9 stents"  . ESOPHAGOGASTRODUODENOSCOPY N/A 10/19/2014   Procedure: ESOPHAGOGASTRODUODENOSCOPY (EGD);  Surgeon: Jerene Bears, MD;  Location: [facility] ENDOSCOPY;  Service: Endoscopy;  Laterality: N/A;  . FUSION OF TALONAVICULAR JOINT Right 04/15/2014   dr duda  . HERNIA REPAIR     umbilical  . INGUINAL HERNIA REPAIR Right 05/11/2015   Procedure: LAPAROSCOPIC REPAIR RIGHT  INGUINAL HERNIA;  Surgeon: Greer Pickerel, MD;  Location: [facility];  Service: General;  Laterality: Right;  . INSERTION OF MESH Right 05/11/2015   Procedure: INSERTION OF MESH;  Surgeon: Greer Pickerel, MD;  Location: [facility];  Service: General;  Laterality: Right;  . JOINT REPLACEMENT Bilateral    knees  . KNEE CARTILAGE SURGERY Right X 12   "~ 1/2 open; ~ 1/2 scopes"  . KNEE CARTILAGE SURGERY Left X 3   "3 scopes"  . LEFT HEART CATHETERIZATION WITH CORONARY ANGIOGRAM N/A 02/10/2013   Procedure: LEFT HEART  CATHETERIZATION WITH CORONARY ANGIOGRAM;  Surgeon: Burnell Blanks, MD;  Location[facility] CATH LAB;  Service: Cardiovascular;  Laterality: N/A;  . LUMBAR LAMINECTOMY/DECOMPRESSION MICRODISCECTOMY N/A 01/27/2014   Procedure: CENTRAL LUMBAR LAMINECTOMY L4-5 AND L3-4;  Surgeon: Jessy Oto, MD;  Location: [facility];  Service: Orthopedics;  Laterality: N/A;  . ORIF ANKLE FRACTURE Right 09/02/2015   Procedure: OPEN REDUCTION INTERNAL FIXATION (ORIF) ANKLE FRACTURE;  Surgeon: Newt Minion, MD;  Location: [facility];  Service: Orthopedics;  Laterality: Right;  . PERIPHERALLY INSERTED CENTRAL CATHETER INSERTION  09/02/2015  . SHOULDER ARTHROSCOPY W/ ROTATOR CUFF REPAIR Bilateral    "3 on the right; 1 on the left"  . SKIN SPLIT GRAFT Right 10/01/2015   Procedure:  RIGHT ANKLE APPLY SKIN GRAFT SPLIT THICKNESS;  Surgeon: Newt Minion, MD;  Location: [facility];  Service: Orthopedics;  Laterality: Right;  . TONSILLECTOMY    . TOTAL KNEE ARTHROPLASTY Bilateral 2008  . UMBILICAL HERNIA REPAIR     UHR  . URETHRAL DILATION  X 4  . VASECTOMY      There were no vitals filed for this visit.  Subjective Assessment - 11/15/17 1107    Subjective  Pt reported MD switched pt from Prozac to Cymbalta on Monday. Pt reported back pain was worse the next day after last session, incr. to 9/10 and is currently 8/10 (pt took pain meds an hour prior to appt).     Pertinent History  Extensive medical history reviewed in EPIC.  R BKA with postural compensations, h/o back surgery approx 4 years ago, h/o neck surgery.  Bilateral shoulder pain (can't raise R arm due to RTC tear), h/o knee replacements.  Obesity, sleep apnea.     Patient Stated Goals  Improve "the ability to walk and stand without pain."    Currently in Pain?  Yes    Pain Score  8     Pain Location  Back    Pain Orientation  Lower;Mid    Pain Descriptors / Indicators  Stabbing    Pain Type  Chronic pain    Pain Radiating Towards  radiates to [name] buttock    Pain Onset   More than a month ago    Pain Frequency  Constant    Aggravating Factors   Exercises, standing/walking    Pain Relieving Factors  Lying down.              Therex:  Exercises (reviewed) Seated Lumbar Flexion Stretch - 3 reps - 1 sets - 30-60 seconds hold - 1x daily - 7x weekly Supine March - 10 reps - 2 sets - 1x daily - 7x weekly Supine Lower Trunk Rotation - 10 reps - 1 sets - 5 hold - 1x daily - 7x weekly  Access Code: [email or web address]  URL: [email or web address]  Date: 11/15/2017  Prepared by: Geoffry Paradise   Exercises  (modified HEP) Supine March - 5 reps - 3 sets - 1x daily - 7x weekly  Supine Lower Trunk Rotation - 10 reps - 1 sets - 2 hold - 1x daily - 7x weekly  Hooklying Single Knee to Chest Stretch - 3 reps - 1 sets - 30 hold - 1x daily - 7x weekly  Supine Posterior Pelvic Tilt with Pelvic Floor Contraction - 5 reps - 3 sets - 1x daily - 7x weekly     Manual Therapy: Pt in hooklying: PT assisted pt perform LLE muscle energy technique to improve hamstring flexibility 3x10 sec. During contraction with 3x30sec. Hold during single knee to chest stretch. PT also performed LLE distraction to reduce pain 3x30-60sec. Holds.       [facility] Adult PT Treatment/Exercise - 11/15/17 1109      Ambulation/Gait   Ambulation/Gait  Yes    Ambulation/Gait Assistance  6: Modified independent (Device/Increase time)    Ambulation Distance (Feet)  100 Feet    Assistive device  Straight cane    Gait Pattern  Decreased step length - right;Step-to pattern;Decreased stride length;Left hip hike;Lateral hip instability;Trendelenburg;Antalgic    Ambulation Surface  Level;Indoor    Gait velocity  2.5 ft/sec and 2.82f/sec.             PT Education - 11/15/17 1142    Education Details  PT discussed the modified HEP and the importance of continuing to move in order to decr. pain.     Person(s) Educated  Patient    Methods  Explanation;Demonstration;Tactile cues;Verbal  cues;Handout    Comprehension  Returned demonstration;Verbalized understanding       PT Short Term Goals - 11/01/17 2155      PT SHORT TERM GOAL #1   Title  The aptient will be indep with HEP for LE stretching, core stability, hip strengthening, and general mobilty.    Time  4    Period  Weeks    Status  New    Target Date  12/01/17      PT SHORT TERM GOAL #2   Title  The patient will report resting pain < or equal to 3/10.    Baseline  5/10 at rest    Time  4    Period  Weeks    Status  New    Target Date  12/01/17      PT SHORT TERM GOAL #3   Title  The patient will improve [name] hip flexion from 3/5 (due to pain) up to 4/5.    Time  4    Period  Weeks    Status  New    Target Date  12/01/17      PT SHORT TERM GOAL #4   Title  The patient will tolerate [name] knee to chest with [name] SI region pain < or equal to 5/10 (10/10 today).    Time  4    Period  Weeks    Status  New    Target Date  12/01/17        PT Long Term Goals - 11/15/17 1145      PT LONG TERM GOAL #1   Title  The patient will be further assessed on gait speed and goal to follow.    Time  8    Period  Weeks    Status  Achieved      PT LONG TERM GOAL #2   Title  The patient will be indep with progresssion of HEP.    Time  8    Period  Weeks      PT LONG TERM GOAL #3   Title  The patient will verbalize understanding of pain management techniques to manage chronic pain.    Time  8    Period  Weeks      PT LONG TERM GOAL #4   Title  The patinet will improve bilateral LE hip flexion to 5/5.    Time  8    Period  Weeks      PT LONG TERM GOAL #5   Title  Pt will improve gait speed with LRAD to >/=3.38f/sec to safely amb. in the community and to make significant improvements.     [facility]  Status  New            Plan - 11/15/17 1143    Clinical Impression Statement  Today's skilled session focused on modifying HEP as pt reported incr. back pain after last session. Pt  tolerated modified HEP well and reported reduced pain at end of session today to 7/10. Pt also reported reduced pain after LLE distraction and contract relax muscle technique. Pt's gait speed indicates pt is not able to safely amb. in the community, but is at a decr. risk for falls. Continue with POC.     Rehab Potential  Good    Clinical Impairments Affecting Rehab Potential  standing posture, gait deviations contribute to back pain and muscle asymmetry    PT Frequency  2x / week    PT Duration  4 weeks + 1x/week for 4 weeks    PT Treatment/Interventions  ADLs/Self Care Home Management;Therapeutic exercise;Balance training;Neuromuscular re-education;Patient/family education;Gait training;Therapeutic activities;Prosthetic Training;Dry needling;Manual techniques;Cryotherapy;Moist Heat;Traction    PT Next Visit Plan  Check HEP prn, perform SI muscle energy, hip flexor strengthening, SI mobilization to tolerance, standing trunk flexion leaning with back to wall for support (williams flexion to relieve stenosis pain).      Consulted and Agree with Plan of Care  Patient       Patient will benefit from skilled therapeutic intervention in order to improve the following deficits and impairments:  Abnormal gait, Hypomobility, Obesity, Decreased activity tolerance, Decreased mobility, Decreased strength, Pain, Increased muscle spasms, Impaired flexibility, Postural dysfunction  Visit Diagnosis: Low back pain with sciatica, sciatica laterality unspecified, unspecified back pain laterality, unspecified chronicity  Muscle weakness (generalized)  Other abnormalities of gait and mobility     Problem List Patient Active Problem List   Diagnosis Date Noted  . Morbid obesity ([place]) 10/09/2017  . Exposure to sexually transmitted disease (STD) 07/14/2017  . Low back pain 07/13/2017  . Leukoplakia, tongue 01/26/2017  . Acute sinus infection 10/20/2016  . Hyperglycemia 10/20/2016  . Skin lesion 10/20/2016   . S/P unilateral BKA (below knee amputation), right ([place]) 06/14/2016  . Charcot foot due to diabetes mellitus ([place])   . Charcot's arthropathy associated with type 2 diabetes mellitus ([place]) 04/11/2016  . Encounter for well adult exam with abnormal findings 11/05/2015  . Major depression 09/13/2015  . S/P TKR (total knee replacement) bilaterally 09/13/2015  . GERD (gastroesophageal reflux disease) 09/08/2015  . S/P laparoscopic hernia repair 05/11/2015  . PPD positive 04/08/2015  . Benign neoplasm of descending colon   . Benign neoplasm of cecum   . AKI (acute kidney injury) ([place]) 10/18/2014  . Acute blood loss anemia   . Chronic anticoagulation   . Occult blood positive stool 10/17/2014  . General weakness 07/14/2014  . Urinary incontinence 07/14/2014  . Headache(784.0) 10/15/2013  . Spinal stenosis in cervical region 09/26/2013    Class: Chronic  . Spinal stenosis, lumbar region, with neurogenic claudication 09/26/2013    Class: Chronic  . Hand joint pain 06/10/2013  . Rotator cuff tear arthropathy of both shoulders 06/10/2013  . Skin lesion of cheek 05/01/2013  . Pain of right thumb 04/03/2013  . Balance disorder 03/12/2013  . Gait disorder 03/12/2013  . Tremor 03/12/2013  . Left hip pain 03/12/2013  . Pre-ulcerative corn or callous 02/06/2013  . Anxiety 11/12/2011  . OSA (obstructive sleep apnea) 11/07/2011  . Bradycardia 10/20/2011  . Insomnia 10/04/2011  . Obesity 01/12/2011  . ERECTILE DYSFUNCTION, ORGANIC 05/30/2010  . [place] DISEASE, LUMBAR 04/19/2010  . SCIATICA, LEFT 04/19/2010  . Chronic pain syndrome 10/27/2009  . Hyperlipidemia 07/15/2009  . Essential hypertension 06/24/2009  . CAD (coronary artery disease) 06/24/2009  . Allergic rhinitis 06/24/2009  . URETHRAL STRICTURE 06/24/2009  . DEGENERATIVE JOINT DISEASE 06/24/2009  . SHOULDER PAIN, BILATERAL 06/24/2009  . FATIGUE 06/24/2009  . NEPHROLITHIASIS, HX OF 06/24/2009    Travis [name] 11/15/2017,  11:48 AM  [facility] [address] [place], [place], [address] Phone: [phone]   Fax:  [phone]  Name: Travis Iddings Sr. MRN: [id number] Date of Birth: 1951-03-08  Geoffry Paradise, PT,DPT  11/15/17 11:49 AM Phone: [phone] Fax: [phone]

## 2017-11-15 NOTE — Patient Instructions (Addendum)
   Access Code: [email or web address]       URL: [email or web address]    Date: 11/01/2017  Prepared by: Rudell Cobb   Exercises Seated Lumbar Flexion Stretch - 3 reps - 1 sets - 30-60 seconds hold - 1x daily - 7x weekly Supine March - 10 reps - 2 sets - 1x daily - 7x weekly Supine Lower Trunk Rotation - 10 reps - 1 sets - 5 hold - 1x daily - 7x weekly  Access Code: [email or web address]  URL: [email or web address]  Date: 11/15/2017  Prepared by: Geoffry Paradise   Exercises  (modified HEP) Supine March - 5 reps - 3 sets - 1x daily - 7x weekly  Supine Lower Trunk Rotation - 10 reps - 1 sets - 2 hold - 1x daily - 7x weekly  Hooklying Single Knee to Chest Stretch - 3 reps - 1 sets - 30 hold - 1x daily - 7x weekly  Supine Posterior Pelvic Tilt with Pelvic Floor Contraction - 5 reps - 3 sets - 1x daily - 7x weekly

## 2017-11-16 ENCOUNTER — Ambulatory Visit: Payer: Medicare HMO | Admitting: Rehabilitative and Restorative Service Providers"

## 2017-11-16 DIAGNOSIS — R293 Abnormal posture: Secondary | ICD-10-CM

## 2017-11-16 DIAGNOSIS — R2689 Other abnormalities of gait and mobility: Secondary | ICD-10-CM | POA: Diagnosis not present

## 2017-11-16 DIAGNOSIS — M6281 Muscle weakness (generalized): Secondary | ICD-10-CM | POA: Diagnosis not present

## 2017-11-16 DIAGNOSIS — M5440 Lumbago with sciatica, unspecified side: Secondary | ICD-10-CM | POA: Diagnosis not present

## 2017-11-16 DIAGNOSIS — R6889 Other general symptoms and signs: Secondary | ICD-10-CM | POA: Diagnosis not present

## 2017-11-16 NOTE — Patient Instructions (Signed)
Access Code: [email or web address]  URL: [email or web address]  Date: 11/16/2017  Prepared by: Rudell Cobb   Exercises Seated Lumbar Flexion Stretch - 3 reps - 1 sets - 30-60 seconds hold - 1x daily - 7x weekly Supine March - 5 reps - 3 sets - 1x daily - 7x weekly Supine Lower Trunk Rotation - 10 reps - 1 sets - 2 hold - 1x daily - 7x weekly Hooklying Single Knee to Chest Stretch - 3 reps - 1 sets - 30 hold - 1x daily - 7x weekly Supine Posterior Pelvic Tilt with Pelvic Floor Contraction - 5 reps - 3 sets - 1x daily - 7x weekly Seated Hamstring Stretch - 3 reps - 1 sets - 30 seconds hold - 1x daily - 7x weekly

## 2017-11-16 NOTE — Therapy (Signed)
[facility] [address] [place], [place], [address] Phone: [phone]   Fax:  [phone]  Physical Therapy Treatment  Patient Details  Name: Travis Ala Sr. MRN: [id number] Date of Birth: 1950-12-19 Referring Provider: [name], MD   Encounter Date: 11/16/2017  PT End of Session - 11/16/17 1324    Visit Number  3    Number of Visits  12    Date for PT Re-Evaluation  12/31/17    Authorization Type  [employer] medicare and medicaid    PT Start Time  1322    PT Stop Time  1402    PT Time Calculation (min)  40 min    Activity Tolerance  Patient limited by pain;No increased pain    Behavior During Therapy  WFL for tasks assessed/performed       Past Medical History:  Diagnosis Date  . ALLERGIC RHINITIS 06/24/2009  . Allergic rhinitis 06/24/2009   Qualifier: Diagnosis of  By: [name], [name]   . Anxiety 11/12/2011   Adequate for discharge   . Arthritis    "all my joints" (09/30/2013)  . Arthritis of foot, right, degenerative 04/15/2014  . Balance disorder 03/12/2013  . Benign neoplasm of cecum   . Benign neoplasm of descending colon   . CAD (coronary artery disease) 06/24/2009   5 stents placed in 2007    . Chronic anticoagulation   . Chronic pain syndrome 10/27/2009   of ankle, shoulders, low back.  sciatica.   . Closed fracture of right foot 10/17/2014  . CORONARY ARTERY DISEASE 06/24/2009   a. s/p multiple PCIs - In 2008 he had a Taxus DES to the mild LAD, Endeavor DES to mid LCX and distal LCX. In January 2009 he had DES to distal LCX, mid LCX and proximal LCX. In November 2009 had BMS x 2 to the mid RCA. Cath 10/2011 with patent stents, noncardiac CP. LHC 01/2013: patent stents (noncardiac CP).  . DEGENERATIVE JOINT DISEASE 06/24/2009   Qualifier: Diagnosis of  By: [name], [name]   . Depression   . Depression with anxiety    Prior suicide attempt  . [place] DISEASE, LUMBAR 04/19/2010  . ERECTILE DYSFUNCTION, ORGANIC  05/30/2010  . Essential hypertension 06/24/2009   Qualifier: Diagnosis of  By: [name], [name]   . Fibromyalgia   . Fracture dislocation of ankle joint 09/02/2015  . Gait disorder 03/12/2013  . General weakness 07/14/2014  . GERD (gastroesophageal reflux disease) 09/08/2015  . Hand joint pain 06/10/2013  . Heart murmur   . Hepatitis C   . History of kidney stones   . Hyperlipidemia 07/15/2009   Qualifier: Diagnosis of  By: Aundra Dubin, MD, [place]    . HYPERLIPIDEMIA-MIXED 07/15/2009  . HYPERTENSION 06/24/2009  . Insomnia 10/04/2011  . Irregular heart beat   . Left hip pain 03/12/2013   Injected under ultrasound guidance on June 24, 2013   . Major depression 09/13/2015  . Myocardial infarction ([place]) 2008  . Non-cardiac chest pain 10/2011, 01/2013  . Obesity   . Occult blood positive stool 10/17/2014  . Open ankle fracture 09/02/2015  . OSA (obstructive sleep apnea)    not using CPAP (09/30/2013)  . Pain of right thumb 04/03/2013  . Pneumonia   . PPD positive 04/08/2015  . Pre-ulcerative corn or callous 02/06/2013  . Restless legs   . Rotator cuff tear arthropathy of both shoulders 06/10/2013   History of bilateral shoulder cuff surgery for rotator cuff  tears. Reports increase in pain 09/11/2015 during physical therapy of the left shoulder.   [name] SCIATICA, LEFT 04/19/2010   Qualifier: Diagnosis of  By: [name], [name]   . Spinal stenosis in cervical region 09/26/2013  . Spinal stenosis, lumbar region, with neurogenic claudication 09/26/2013  . Type II diabetes mellitus ([place]) 2012   no meds in 09/2014.   [name] Uncontrolled type 2 DM with peripheral circulatory disorder ([place]) 10/04/2013  . URETHRAL STRICTURE 06/24/2009   self catheterizes.     Past Surgical History:  Procedure Laterality Date  . AMPUTATION Right 06/14/2016   Procedure: AMPUTATION BELOW KNEE;  Surgeon: Newt Minion, MD;  Location: [facility];  Service: Orthopedics;  Laterality: Right;  . ANKLE FUSION Right 04/15/2014   Procedure: Right  Subtalar, Talonavicular Fusion;  Surgeon: Newt Minion, MD;  Location: [facility];  Service: Orthopedics;  Laterality: Right;  . ANKLE FUSION Right 04/18/2016   Right Ankle Tibiocalcaneal Fusion; Removal of deep retained hardware; application of wound vac/notes 04/18/2016  . ANKLE FUSION Right 04/18/2016   Procedure: Right Ankle Tibiocalcaneal Fusion;  Surgeon: Newt Minion, MD;  Location: [facility];  Service: Orthopedics;  Laterality: Right;  . ANTERIOR CERVICAL DECOMP/DISCECTOMY FUSION N/A 09/26/2013   Procedure: ANTERIOR CERVICAL DISCECTOMY FUSION C3-4, plate and screw fixation, allograft bone graft;  Surgeon: Jessy Oto, MD;  Location: [facility];  Service: Orthopedics;  Laterality: N/A;  . BACK SURGERY    . BELOW KNEE LEG AMPUTATION Right 06/14/2016  . CARDIAC CATHETERIZATION  X 1  . CARPAL TUNNEL RELEASE Bilateral   . COLONOSCOPY    . COLONOSCOPY N/A 10/22/2014   Procedure: COLONOSCOPY;  Surgeon: Lafayette Dragon, MD;  Location: [facility] ENDOSCOPY;  Service: Endoscopy;  Laterality: N/A;  . CORONARY ANGIOPLASTY WITH STENT PLACEMENT     "I have 9 stents"  . ESOPHAGOGASTRODUODENOSCOPY N/A 10/19/2014   Procedure: ESOPHAGOGASTRODUODENOSCOPY (EGD);  Surgeon: Jerene Bears, MD;  Location: [facility] ENDOSCOPY;  Service: Endoscopy;  Laterality: N/A;  . FUSION OF TALONAVICULAR JOINT Right 04/15/2014   dr duda  . HERNIA REPAIR     umbilical  . INGUINAL HERNIA REPAIR Right 05/11/2015   Procedure: LAPAROSCOPIC REPAIR RIGHT  INGUINAL HERNIA;  Surgeon: Greer Pickerel, MD;  Location: [facility];  Service: General;  Laterality: Right;  . INSERTION OF MESH Right 05/11/2015   Procedure: INSERTION OF MESH;  Surgeon: Greer Pickerel, MD;  Location: [facility];  Service: General;  Laterality: Right;  . JOINT REPLACEMENT Bilateral    knees  . KNEE CARTILAGE SURGERY Right X 12   "~ 1/2 open; ~ 1/2 scopes"  . KNEE CARTILAGE SURGERY Left X 3   "3 scopes"  . LEFT HEART CATHETERIZATION WITH CORONARY ANGIOGRAM N/A 02/10/2013   Procedure: LEFT HEART  CATHETERIZATION WITH CORONARY ANGIOGRAM;  Surgeon: Burnell Blanks, MD;  Location: [facility] CATH LAB;  Service: Cardiovascular;  Laterality: N/A;  . LUMBAR LAMINECTOMY/DECOMPRESSION MICRODISCECTOMY N/A 01/27/2014   Procedure: CENTRAL LUMBAR LAMINECTOMY L4-5 AND L3-4;  Surgeon: Jessy Oto, MD;  Location: [facility];  Service: Orthopedics;  Laterality: N/A;  . ORIF ANKLE FRACTURE Right 09/02/2015   Procedure: OPEN REDUCTION INTERNAL FIXATION (ORIF) ANKLE FRACTURE;  Surgeon: Newt Minion, MD;  Location: [facility];  Service: Orthopedics;  Laterality: Right;  . PERIPHERALLY INSERTED CENTRAL CATHETER INSERTION  09/02/2015  . SHOULDER ARTHROSCOPY W/ ROTATOR CUFF REPAIR Bilateral    "3 on the right; 1 on the left"  . SKIN SPLIT GRAFT Right 10/01/2015   Procedure:  RIGHT ANKLE APPLY SKIN GRAFT SPLIT THICKNESS;  Surgeon: Newt Minion, MD;  Location: [facility];  Service: Orthopedics;  Laterality: Right;  . TONSILLECTOMY    . TOTAL KNEE ARTHROPLASTY Bilateral 2008  . UMBILICAL HERNIA REPAIR     UHR  . URETHRAL DILATION  X 4  . VASECTOMY      There were no vitals filed for this visit.  Subjective Assessment - 11/16/17 1324    Subjective  Patient notes pain level 9/10 today noting worsening pain for 1-2 days with therapy sessions.      Pertinent History  Extensive medical history reviewed in EPIC.  R BKA with postural compensations, h/o back surgery approx 4 years ago, h/o neck surgery.  Bilateral shoulder pain (can't raise R arm due to RTC tear), h/o knee replacements.  Obesity, sleep apnea.     Patient Stated Goals  Improve "the ability to walk and stand without pain."    Currently in Pain?  Yes    Pain Score  9     Pain Location  Back    Pain Orientation  Left    Pain Descriptors / Indicators  Aching;Stabbing    Pain Type  Chronic pain    Pain Radiating Towards  radiates into left hip    Pain Onset  More than a month ago    Pain Frequency  Constant    Aggravating Factors   exercises, standing/walking     Pain Relieving Factors  lying down                       [facility] Adult PT Treatment/Exercise - 11/16/17 1337      Ambulation/Gait   Ambulation/Gait  Yes    Ambulation/Gait Assistance  6: Modified independent (Device/Increase time)    Ambulation Distance (Feet)  140 Feet x 2 reps    Assistive device  Straight cane    Ambulation Surface  Level;Indoor    Gait Comments  PT provided tactile cues in order to decrease left hip and provide a lateral force with counterforce provided at R ribs (to better align posture/spine)      Exercises   Exercises  Lumbar      Lumbar Exercises: Stretches   Active Hamstring Stretch  2 reps;30 seconds    Active Hamstring Stretch Limitations  Seated hamstring stretch- R side used a stool to lift to increase stretch; left leg props on floor for hamstring stretch.    Hip Flexor Stretch  4 reps;20 seconds    Hip Flexor Stretch Limitations  with passive overpressure by PT in sidelying working on hip extension for flexion stretch      Lumbar Exercises: Seated   Other Seated Lumbar Exercises  seated lumbar flexion which decreases pain from 9/10 down to 6/10; rolling a physioall anteriorly with cues on upright posture and anterior pelvic tilt, seated marching x 5 reps each side.     Other Seated Lumbar Exercises  Elbow propping in sitting working on hip hike/depression with passive overpressure      Lumbar Exercises: Sidelying   Other Sidelying Lumbar Exercises  sidelying hip extension with passive overpressure, sidelying hip hiking/depression.             PT Education - 11/15/17 1142    Education Details  PT discussed the modified HEP and the importance of continuing to move in order to decr. pain.     Person(s) Educated  Patient    Methods  Explanation;Demonstration;Tactile cues;Verbal cues;Handout  Comprehension  Returned demonstration;Verbalized understanding       PT Short Term Goals - 11/01/17 2155      PT SHORT TERM GOAL #1    Title  The aptient will be indep with HEP for LE stretching, core stability, hip strengthening, and general mobilty.    Time  4    Period  Weeks    Status  New    Target Date  12/01/17      PT SHORT TERM GOAL #2   Title  The patient will report resting pain < or equal to 3/10.    Baseline  5/10 at rest    Time  4    Period  Weeks    Status  New    Target Date  12/01/17      PT SHORT TERM GOAL #3   Title  The patient will improve L hip flexion from 3/5 (due to pain) up to 4/5.    Time  4    Period  Weeks    Status  New    Target Date  12/01/17      PT SHORT TERM GOAL #4   Title  The patient will tolerate L knee to chest with L SI region pain < or equal to 5/10 (10/10 today).    Time  4    Period  Weeks    Status  New    Target Date  12/01/17        PT Long Term Goals - 11/15/17 1145      PT LONG TERM GOAL #1   Title  The patient will be further assessed on gait speed and goal to follow.    Time  8    Period  Weeks    Status  Achieved      PT LONG TERM GOAL #2   Title  The patient will be indep with progresssion of HEP.    Time  8    Period  Weeks      PT LONG TERM GOAL #3   Title  The patient will verbalize understanding of pain management techniques to manage chronic pain.    Time  8    Period  Weeks      PT LONG TERM GOAL #4   Title  The patinet will improve bilateral LE hip flexion to 5/5.    Time  8    Period  Weeks      PT LONG TERM GOAL #5   Title  Pt will improve gait speed with LRAD to >/=3.48f/sec to safely amb. in the community and to make significant improvements.     [facility]  Status  New            Plan - 11/16/17 1421    Clinical Impression Statement  The patient appears to be getting delayed onset muscle soreness with PT noting 1-2 days of increased pain/soreness.  PT recommended we not add further HEP (only added hamstring stretch today due to relieving symptoms) until he is tolerating therapy better.  PT to  emphasize standing positioning with prosthesis as this is leading to functional leg length discrepancy and worsening back pain.    PT Next Visit Plan  EMPHASIZE STANDING POSITION AND prosthetic weight shift,Check HEP prn, perform SI muscle energy, hip flexor strengthening, SI mobilization to tolerance, standing trunk flexion leaning with back to wall for support (williams flexion to relieve stenosis pain).      Consulted and  Agree with Plan of Care  Patient       Patient will benefit from skilled therapeutic intervention in order to improve the following deficits and impairments:  Abnormal gait, Hypomobility, Obesity, Decreased activity tolerance, Decreased mobility, Decreased strength, Pain, Increased muscle spasms, Impaired flexibility, Postural dysfunction  Visit Diagnosis: Low back pain with sciatica, sciatica laterality unspecified, unspecified back pain laterality, unspecified chronicity  Muscle weakness (generalized)  Other abnormalities of gait and mobility  Abnormal posture     Problem List Patient Active Problem List   Diagnosis Date Noted  . Morbid obesity ([place]) 10/09/2017  . Exposure to sexually transmitted disease (STD) 07/14/2017  . Low back pain 07/13/2017  . Leukoplakia, tongue 01/26/2017  . Acute sinus infection 10/20/2016  . Hyperglycemia 10/20/2016  . Skin lesion 10/20/2016  . S/P unilateral BKA (below knee amputation), right ([place]) 06/14/2016  . Charcot foot due to diabetes mellitus ([place])   . Charcot's arthropathy associated with type 2 diabetes mellitus ([place]) 04/11/2016  . Encounter for well adult exam with abnormal findings 11/05/2015  . Major depression 09/13/2015  . S/P TKR (total knee replacement) bilaterally 09/13/2015  . GERD (gastroesophageal reflux disease) 09/08/2015  . S/P laparoscopic hernia repair 05/11/2015  . PPD positive 04/08/2015  . Benign neoplasm of descending colon   . Benign neoplasm of cecum   . AKI (acute kidney injury) ([place])  10/18/2014  . Acute blood loss anemia   . Chronic anticoagulation   . Occult blood positive stool 10/17/2014  . General weakness 07/14/2014  . Urinary incontinence 07/14/2014  . Headache(784.0) 10/15/2013  . Spinal stenosis in cervical region 09/26/2013    Class: Chronic  . Spinal stenosis, lumbar region, with neurogenic claudication 09/26/2013    Class: Chronic  . Hand joint pain 06/10/2013  . Rotator cuff tear arthropathy of both shoulders 06/10/2013  . Skin lesion of cheek 05/01/2013  . Pain of right thumb 04/03/2013  . Balance disorder 03/12/2013  . Gait disorder 03/12/2013  . Tremor 03/12/2013  . Left hip pain 03/12/2013  . Pre-ulcerative corn or callous 02/06/2013  . Anxiety 11/12/2011  . OSA (obstructive sleep apnea) 11/07/2011  . Bradycardia 10/20/2011  . Insomnia 10/04/2011  . Obesity 01/12/2011  . ERECTILE DYSFUNCTION, ORGANIC 05/30/2010  . [place] DISEASE, LUMBAR 04/19/2010  . SCIATICA, LEFT 04/19/2010  . Chronic pain syndrome 10/27/2009  . Hyperlipidemia 07/15/2009  . Essential hypertension 06/24/2009  . CAD (coronary artery disease) 06/24/2009  . Allergic rhinitis 06/24/2009  . URETHRAL STRICTURE 06/24/2009  . DEGENERATIVE JOINT DISEASE 06/24/2009  . SHOULDER PAIN, BILATERAL 06/24/2009  . FATIGUE 06/24/2009  . NEPHROLITHIASIS, HX OF 06/24/2009    Casey Maxfield , PT 11/16/2017, 2:23 PM  [facility] [address] [place], [place], [address] Phone: [phone]   Fax:  [phone]  Name: Travis Hendon Sr. MRN: [id number] Date of Birth: 08/13/1950

## 2017-11-20 ENCOUNTER — Ambulatory Visit: Payer: Medicare HMO | Attending: Specialist | Admitting: Rehabilitation

## 2017-11-20 ENCOUNTER — Encounter: Payer: Self-pay | Admitting: Rehabilitation

## 2017-11-20 ENCOUNTER — Ambulatory Visit (INDEPENDENT_AMBULATORY_CARE_PROVIDER_SITE_OTHER): Payer: Medicare HMO | Admitting: Psychology

## 2017-11-20 DIAGNOSIS — F332 Major depressive disorder, recurrent severe without psychotic features: Secondary | ICD-10-CM | POA: Diagnosis not present

## 2017-11-20 DIAGNOSIS — M5440 Lumbago with sciatica, unspecified side: Secondary | ICD-10-CM | POA: Diagnosis not present

## 2017-11-20 DIAGNOSIS — M6281 Muscle weakness (generalized): Secondary | ICD-10-CM | POA: Diagnosis not present

## 2017-11-20 DIAGNOSIS — R6889 Other general symptoms and signs: Secondary | ICD-10-CM | POA: Diagnosis not present

## 2017-11-20 DIAGNOSIS — R293 Abnormal posture: Secondary | ICD-10-CM

## 2017-11-20 DIAGNOSIS — R2689 Other abnormalities of gait and mobility: Secondary | ICD-10-CM

## 2017-11-20 NOTE — Therapy (Signed)
[facility] [address] [place], [place], [address] Phone: [phone]   Fax:  [phone]  Physical Therapy Treatment  Patient Details  Name: Travis Knoth Sr. MRN: [id number] Date of Birth: [birth year] Referring Provider: [name], MD   Encounter Date: 11/20/2017  PT End of Session - 11/20/17 1349    Visit Number  4    Number of Visits  12    Date for PT Re-Evaluation  12/31/17    Authorization Type  [employer] medicare and medicaid    PT Start Time  1150    PT Stop Time  1230    PT Time Calculation (min)  40 min    Activity Tolerance  Patient limited by pain;No increased pain    Behavior During Therapy  WFL for tasks assessed/performed       Past Medical History:  Diagnosis Date  . ALLERGIC RHINITIS 06/24/2009  . Allergic rhinitis 06/24/2009   Qualifier: Diagnosis of  By: [name], [name]   . Anxiety 11/12/2011   Adequate for discharge   . Arthritis    "all my joints" (09/30/2013)  . Arthritis of foot, right, degenerative 04/15/2014  . Balance disorder 03/12/2013  . Benign neoplasm of cecum   . Benign neoplasm of descending colon   . CAD (coronary artery disease) 06/24/2009   5 stents placed in 2007    . Chronic anticoagulation   . Chronic pain syndrome 10/27/2009   of ankle, shoulders, low back.  sciatica.   . Closed fracture of right foot 10/17/2014  . CORONARY ARTERY DISEASE 06/24/2009   a. s/p multiple PCIs - In 2008 he had a Taxus DES to the mild LAD, Endeavor DES to mid LCX and distal LCX. In January 2009 he had DES to distal LCX, mid LCX and proximal LCX. In November 2009 had BMS x 2 to the mid RCA. Cath 10/2011 with patent stents, noncardiac CP. LHC 01/2013: patent stents (noncardiac CP).  . DEGENERATIVE JOINT DISEASE 06/24/2009   Qualifier: Diagnosis of  By: [name], [name]   . Depression   . Depression with anxiety    Prior suicide attempt  . [place] DISEASE, LUMBAR 04/19/2010  . ERECTILE DYSFUNCTION, ORGANIC  05/30/2010  . Essential hypertension 06/24/2009   Qualifier: Diagnosis of  By: [name], [name]   . Fibromyalgia   . Fracture dislocation of ankle joint 09/02/2015  . Gait disorder 03/12/2013  . General weakness 07/14/2014  . GERD (gastroesophageal reflux disease) 09/08/2015  . Hand joint pain 06/10/2013  . Heart murmur   . Hepatitis C   . History of kidney stones   . Hyperlipidemia 07/15/2009   Qualifier: Diagnosis of  By: Aundra Dubin, MD, [place]    . HYPERLIPIDEMIA-MIXED 07/15/2009  . HYPERTENSION 06/24/2009  . Insomnia 10/04/2011  . Irregular heart beat   . Left hip pain 03/12/2013   Injected under ultrasound guidance on June 24, 2013   . Major depression 09/13/2015  . Myocardial infarction ([place]) 2008  . Non-cardiac chest pain 10/2011, 01/2013  . Obesity   . Occult blood positive stool 10/17/2014  . Open ankle fracture 09/02/2015  . OSA (obstructive sleep apnea)    not using CPAP (09/30/2013)  . Pain of right thumb 04/03/2013  . Pneumonia   . PPD positive 04/08/2015  . Pre-ulcerative corn or callous 02/06/2013  . Restless legs   . Rotator cuff tear arthropathy of both shoulders 06/10/2013   History of bilateral shoulder cuff surgery for rotator cuff  tears. Reports increase in pain 09/11/2015 during physical therapy of the left shoulder.   [name] SCIATICA, LEFT 04/19/2010   Qualifier: Diagnosis of  By: [name], [name]   . Spinal stenosis in cervical region 09/26/2013  . Spinal stenosis, lumbar region, with neurogenic claudication 09/26/2013  . Type II diabetes mellitus ([place]) 2012   no meds in 09/2014.   [name] Uncontrolled type 2 DM with peripheral circulatory disorder ([place]) 10/04/2013  . URETHRAL STRICTURE 06/24/2009   self catheterizes.     Past Surgical History:  Procedure Laterality Date  . AMPUTATION Right 06/14/2016   Procedure: AMPUTATION BELOW KNEE;  Surgeon: Newt Minion, MD;  Location: [facility];  Service: Orthopedics;  Laterality: Right;  . ANKLE FUSION Right 04/15/2014   Procedure: Right  Subtalar, Talonavicular Fusion;  Surgeon: Newt Minion, MD;  Location: [facility];  Service: Orthopedics;  Laterality: Right;  . ANKLE FUSION Right 04/18/2016   Right Ankle Tibiocalcaneal Fusion; Removal of deep retained hardware; application of wound vac/notes 04/18/2016  . ANKLE FUSION Right 04/18/2016   Procedure: Right Ankle Tibiocalcaneal Fusion;  Surgeon: Newt Minion, MD;  Location: [facility];  Service: Orthopedics;  Laterality: Right;  . ANTERIOR CERVICAL DECOMP/DISCECTOMY FUSION N/A 09/26/2013   Procedure: ANTERIOR CERVICAL DISCECTOMY FUSION C3-4, plate and screw fixation, allograft bone graft;  Surgeon: Jessy Oto, MD;  Location: [facility];  Service: Orthopedics;  Laterality: N/A;  . BACK SURGERY    . BELOW KNEE LEG AMPUTATION Right 06/14/2016  . CARDIAC CATHETERIZATION  X 1  . CARPAL TUNNEL RELEASE Bilateral   . COLONOSCOPY    . COLONOSCOPY N/A 10/22/2014   Procedure: COLONOSCOPY;  Surgeon: Lafayette Dragon, MD;  Location[facility] ENDOSCOPY;  Service: Endoscopy;  Laterality: N/A;  . CORONARY ANGIOPLASTY WITH STENT PLACEMENT     "I have 9 stents"  . ESOPHAGOGASTRODUODENOSCOPY N/A 10/19/2014   Procedure: ESOPHAGOGASTRODUODENOSCOPY (EGD);  Surgeon: Jerene Bears, MD;  Location: [facility] ENDOSCOPY;  Service: Endoscopy;  Laterality: N/A;  . FUSION OF TALONAVICULAR JOINT Right 04/15/2014   dr duda  . HERNIA REPAIR     umbilical  . INGUINAL HERNIA REPAIR Right 05/11/2015   Procedure: LAPAROSCOPIC REPAIR RIGHT  INGUINAL HERNIA;  Surgeon: Greer Pickerel, MD;  Location: [facility];  Service: General;  Laterality: Right;  . INSERTION OF MESH Right 05/11/2015   Procedure: INSERTION OF MESH;  Surgeon: Greer Pickerel, MD;  Location: [facility];  Service: General;  Laterality: Right;  . JOINT REPLACEMENT Bilateral    knees  . KNEE CARTILAGE SURGERY Right X 12   "~ 1/2 open; ~ 1/2 scopes"  . KNEE CARTILAGE SURGERY Left X 3   "3 scopes"  . LEFT HEART CATHETERIZATION WITH CORONARY ANGIOGRAM N/A 02/10/2013   Procedure: LEFT HEART  CATHETERIZATION WITH CORONARY ANGIOGRAM;  Surgeon: Burnell Blanks, MD;  Location: [facility] CATH LAB;  Service: Cardiovascular;  Laterality: N/A;  . LUMBAR LAMINECTOMY/DECOMPRESSION MICRODISCECTOMY N/A 01/27/2014   Procedure: CENTRAL LUMBAR LAMINECTOMY L4-5 AND L3-4;  Surgeon: Jessy Oto, MD;  Location: [facility];  Service: Orthopedics;  Laterality: N/A;  . ORIF ANKLE FRACTURE Right 09/02/2015   Procedure: OPEN REDUCTION INTERNAL FIXATION (ORIF) ANKLE FRACTURE;  Surgeon: Newt Minion, MD;  Location: [facility];  Service: Orthopedics;  Laterality: Right;  . PERIPHERALLY INSERTED CENTRAL CATHETER INSERTION  09/02/2015  . SHOULDER ARTHROSCOPY W/ ROTATOR CUFF REPAIR Bilateral    "3 on the right; 1 on the left"  . SKIN SPLIT GRAFT Right 10/01/2015   Procedure:  RIGHT ANKLE APPLY SKIN GRAFT SPLIT THICKNESS;  Surgeon: Newt Minion, MD;  Location: [facility];  Service: Orthopedics;  Laterality: Right;  . TONSILLECTOMY    . TOTAL KNEE ARTHROPLASTY Bilateral 2008  . UMBILICAL HERNIA REPAIR     UHR  . URETHRAL DILATION  X 4  . VASECTOMY      There were no vitals filed for this visit.  Subjective Assessment - 11/20/17 1152    Subjective  pt reports 8/10 pain today.  Feels that exercises are increasing pain.     Pertinent History  Extensive medical history reviewed in EPIC.  R BKA with postural compensations, h/o back surgery approx 4 years ago, h/o neck surgery.  Bilateral shoulder pain (can't raise R arm due to RTC tear), h/o knee replacements.  Obesity, sleep apnea.     Patient Stated Goals  Improve "the ability to walk and stand without pain."    Currently in Pain?  Yes    Pain Score  8     Pain Location  Back    Pain Orientation  Left    Pain Descriptors / Indicators  Aching;Sore;Stabbing    Pain Type  Chronic pain    Pain Radiating Towards  and also R groin    Pain Onset  More than a month ago    Pain Frequency  Constant    Aggravating Factors   exercises, standing/walking     Pain Relieving Factors   lying down                        [facility] Adult PT Treatment/Exercise - 11/20/17 1155      Transfers   Comments  Worked on sit<>stand for improved postural alignment and improved loading of RLE through prosthesis.  Performed x 10 reps from lower therapy mat with heavy reliance on UEs with PT providing facilitation and assist for improved R lateral weight shift during transition and once in standing.  Transitioned to elevated mat andperformed x 5 reps with marked improvement but continued cues for forward weight shift and hands on lap for improved LE activation.       Ambulation/Gait   Ambulation/Gait  Yes    Ambulation/Gait Assistance  4: Min guard;4: Min assist    Ambulation/Gait Assistance Details  PT provided tactile cues in order to decrease left hip and provide a lateral force with counterforce provided at R ribs (to better align posture/spine)    Ambulation Distance (Feet)  130 Feet    Assistive device  Straight cane    Gait Pattern  Decreased step length - right;Step-to pattern;Decreased stride length;Left hip hike;Lateral hip instability;Trendelenburg;Antalgic    Ambulation Surface  Level;Indoor      Exercises   Exercises  Lumbar;Other Exercises    Other Exercises   Performed gentle R groin stretch at beginning of session x 2 sets of 30 secs.  While seated on green balance disc, had pt work on improved trunk/core activation with shortening/lengthening pushing each hip down x 10 reps progressing to pushing only L hip down while moving LUE with PTs hand to facilitate improve trunk elongation on L side.  Pt tolerated very well.  Also performed seated posterior pelvic tilt on balance disc and back to neutral position x 10 reps.        Lumbar Exercises: Supine   Other Supine Lumbar Exercises  hip abd with seat belt for pelvic stabilization x 10 reps progressing to hip abd into belt with bridging.  Note increased pain with task, therefore had him do pelvic tilt prior to bridge  however this still caused increased pain, but he was able to perform when hip abd component removed and performed only posterior pelvic tilt with bridge x 10 reps.  Posterior pelvic tilt with marching (hooklying) x 10 reps, lower pelvic rotation x 5 reps each side.                 PT Short Term Goals - 11/01/17 2155      PT SHORT TERM GOAL #1   Title  The aptient will be indep with HEP for LE stretching, core stability, hip strengthening, and general mobilty.    Time  4    Period  Weeks    Status  New    Target Date  12/01/17      PT SHORT TERM GOAL #2   Title  The patient will report resting pain < or equal to 3/10.    Baseline  5/10 at rest    Time  4    Period  Weeks    Status  New    Target Date  12/01/17      PT SHORT TERM GOAL #3   Title  The patient will improve L hip flexion from 3/5 (due to pain) up to 4/5.    Time  4    Period  Weeks    Status  New    Target Date  12/01/17      PT SHORT TERM GOAL #4   Title  The patient will tolerate L knee to chest with L SI region pain < or equal to 5/10 (10/10 today).    Time  4    Period  Weeks    Status  New    Target Date  12/01/17        PT Long Term Goals - 11/15/17 1145      PT LONG TERM GOAL #1   Title  The patient will be further assessed on gait speed and goal to follow.    Time  8    Period  Weeks    Status  Achieved      PT LONG TERM GOAL #2   Title  The patient will be indep with progresssion of HEP.    Time  8    Period  Weeks      PT LONG TERM GOAL #3   Title  The patient will verbalize understanding of pain management techniques to manage chronic pain.    Time  8    Period  Weeks      PT LONG TERM GOAL #4   Title  The patinet will improve bilateral LE hip flexion to 5/5.    Time  8    Period  Weeks      PT LONG TERM GOAL #5   Title  Pt will improve gait speed with LRAD to >/=3.64f/sec to safely amb. in the community and to make significant improvements.     [facility]. [facility]  Status  New            Plan - 11/20/17 1349    Clinical Impression Statement  Pt continues to verbalize increased pain/soreness post exercise.  Pt emphasized core/trunk strengthening today in supine/hooklying and sitting.  Also addressed improved functional sit<>stand with WB through prosthesis and decreased trunk rotation.     PT Next Visit Plan  EMPHASIZE STANDING POSITION AND prosthetic weight shift,Check  HEP prn, perform SI muscle energy, hip flexor strengthening, SI mobilization to tolerance, standing trunk flexion leaning with back to wall for support (williams flexion to relieve stenosis pain).      Consulted and Agree with Plan of Care  Patient       Patient will benefit from skilled therapeutic intervention in order to improve the following deficits and impairments:  Abnormal gait, Hypomobility, Obesity, Decreased activity tolerance, Decreased mobility, Decreased strength, Pain, Increased muscle spasms, Impaired flexibility, Postural dysfunction  Visit Diagnosis: Low back pain with sciatica, sciatica laterality unspecified, unspecified back pain laterality, unspecified chronicity  Muscle weakness (generalized)  Other abnormalities of gait and mobility  Abnormal posture     Problem List Patient Active Problem List   Diagnosis Date Noted  . Morbid obesity ([place]) 10/09/2017  . Exposure to sexually transmitted disease (STD) 07/14/2017  . Low back pain 07/13/2017  . Leukoplakia, tongue 01/26/2017  . Acute sinus infection 10/20/2016  . Hyperglycemia 10/20/2016  . Skin lesion 10/20/2016  . S/P unilateral BKA (below knee amputation), right ([place]) 06/14/2016  . Charcot foot due to diabetes mellitus ([place])   . Charcot's arthropathy associated with type 2 diabetes mellitus ([place]) 04/11/2016  . Encounter for well adult exam with abnormal findings 11/05/2015  . Major depression 09/13/2015  . S/P TKR (total knee replacement) bilaterally 09/13/2015  . GERD  (gastroesophageal reflux disease) 09/08/2015  . S/P laparoscopic hernia repair 05/11/2015  . PPD positive 04/08/2015  . Benign neoplasm of descending colon   . Benign neoplasm of cecum   . AKI (acute kidney injury) ([place]) 10/18/2014  . Acute blood loss anemia   . Chronic anticoagulation   . Occult blood positive stool 10/17/2014  . General weakness 07/14/2014  . Urinary incontinence 07/14/2014  . Headache(784.0) 10/15/2013  . Spinal stenosis in cervical region 09/26/2013    Class: Chronic  . Spinal stenosis, lumbar region, with neurogenic claudication 09/26/2013    Class: Chronic  . Hand joint pain 06/10/2013  . Rotator cuff tear arthropathy of both shoulders 06/10/2013  . Skin lesion of cheek 05/01/2013  . Pain of right thumb 04/03/2013  . Balance disorder 03/12/2013  . Gait disorder 03/12/2013  . Tremor 03/12/2013  . Left hip pain 03/12/2013  . Pre-ulcerative corn or callous 02/06/2013  . Anxiety 11/12/2011  . OSA (obstructive sleep apnea) 11/07/2011  . Bradycardia 10/20/2011  . Insomnia 10/04/2011  . Obesity 01/12/2011  . ERECTILE DYSFUNCTION, ORGANIC 05/30/2010  . [place] DISEASE, LUMBAR 04/19/2010  . SCIATICA, LEFT 04/19/2010  . Chronic pain syndrome 10/27/2009  . Hyperlipidemia 07/15/2009  . Essential hypertension 06/24/2009  . CAD (coronary artery disease) 06/24/2009  . Allergic rhinitis 06/24/2009  . URETHRAL STRICTURE 06/24/2009  . DEGENERATIVE JOINT DISEASE 06/24/2009  . SHOULDER PAIN, BILATERAL 06/24/2009  . FATIGUE 06/24/2009  . NEPHROLITHIASIS, HX OF 06/24/2009    Cameron Sprang, PT, MPT [facility] [address] [place], [place], [address] Phone: [phone]   Fax:  [phone] 11/20/17, 1:51 PM  Name: Travis Coberly Sr. MRN: [id number] Date of Birth: June 04, 1950

## 2017-11-21 ENCOUNTER — Other Ambulatory Visit: Payer: Self-pay | Admitting: Internal Medicine

## 2017-11-21 ENCOUNTER — Other Ambulatory Visit (INDEPENDENT_AMBULATORY_CARE_PROVIDER_SITE_OTHER): Payer: Self-pay | Admitting: Specialist

## 2017-11-21 NOTE — Telephone Encounter (Signed)
[facility] refill request

## 2017-11-21 NOTE — Telephone Encounter (Signed)
I called and advised his rx is ready for pick up at the front desk.

## 2017-11-26 ENCOUNTER — Ambulatory Visit: Payer: Medicare HMO | Admitting: Rehabilitative and Restorative Service Providers"

## 2017-11-26 ENCOUNTER — Encounter: Payer: Self-pay | Admitting: Rehabilitative and Restorative Service Providers"

## 2017-11-26 ENCOUNTER — Telehealth (HOSPITAL_COMMUNITY): Payer: Self-pay | Admitting: *Deleted

## 2017-11-26 DIAGNOSIS — R293 Abnormal posture: Secondary | ICD-10-CM | POA: Diagnosis not present

## 2017-11-26 DIAGNOSIS — M5440 Lumbago with sciatica, unspecified side: Secondary | ICD-10-CM

## 2017-11-26 DIAGNOSIS — M6281 Muscle weakness (generalized): Secondary | ICD-10-CM

## 2017-11-26 DIAGNOSIS — R2689 Other abnormalities of gait and mobility: Secondary | ICD-10-CM | POA: Diagnosis not present

## 2017-11-26 DIAGNOSIS — R6889 Other general symptoms and signs: Secondary | ICD-10-CM | POA: Diagnosis not present

## 2017-11-26 NOTE — Therapy (Signed)
[facility] [address] [place], [place], [address] Phone: [phone]   Fax:  [phone]  Physical Therapy Treatment  Patient Details  Name: Travis Insley Sr. MRN: [id number] Date of Birth: May 18, 1951 Referring Provider: [name], MD   Encounter Date: 11/26/2017  PT End of Session - 11/26/17 0952    Visit Number  5    Number of Visits  12    Date for PT Re-Evaluation  12/31/17    Authorization Type  [employer] medicare and medicaid    PT Start Time  0935    PT Stop Time  1018    PT Time Calculation (min)  43 min    Activity Tolerance  Patient limited by pain;No increased pain    Behavior During Therapy  WFL for tasks assessed/performed       Past Medical History:  Diagnosis Date  . ALLERGIC RHINITIS 06/24/2009  . Allergic rhinitis 06/24/2009   Qualifier: Diagnosis of  By: [name], [name]   . Anxiety 11/12/2011   Adequate for discharge   . Arthritis    "all my joints" (09/30/2013)  . Arthritis of foot, right, degenerative 04/15/2014  . Balance disorder 03/12/2013  . Benign neoplasm of cecum   . Benign neoplasm of descending colon   . CAD (coronary artery disease) 06/24/2009   5 stents placed in 2007    . Chronic anticoagulation   . Chronic pain syndrome 10/27/2009   of ankle, shoulders, low back.  sciatica.   . Closed fracture of right foot 10/17/2014  . CORONARY ARTERY DISEASE 06/24/2009   a. s/p multiple PCIs - In 2008 he had a Taxus DES to the mild LAD, Endeavor DES to mid LCX and distal LCX. In January 2009 he had DES to distal LCX, mid LCX and proximal LCX. In November 2009 had BMS x 2 to the mid RCA. Cath 10/2011 with patent stents, noncardiac CP. LHC 01/2013: patent stents (noncardiac CP).  . DEGENERATIVE JOINT DISEASE 06/24/2009   Qualifier: Diagnosis of  By: [name], [name]   . Depression   . Depression with anxiety    Prior suicide attempt  . [place] DISEASE, LUMBAR 04/19/2010  . ERECTILE DYSFUNCTION, ORGANIC  05/30/2010  . Essential hypertension 06/24/2009   Qualifier: Diagnosis of  By: [name], [name]   . Fibromyalgia   . Fracture dislocation of ankle joint 09/02/2015  . Gait disorder 03/12/2013  . General weakness 07/14/2014  . GERD (gastroesophageal reflux disease) 09/08/2015  . Hand joint pain 06/10/2013  . Heart murmur   . Hepatitis C   . History of kidney stones   . Hyperlipidemia 07/15/2009   Qualifier: Diagnosis of  By: Aundra Dubin, MD, [place]    . HYPERLIPIDEMIA-MIXED 07/15/2009  . HYPERTENSION 06/24/2009  . Insomnia 10/04/2011  . Irregular heart beat   . Left hip pain 03/12/2013   Injected under ultrasound guidance on June 24, 2013   . Major depression 09/13/2015  . Myocardial infarction ([place]) 2008  . Non-cardiac chest pain 10/2011, 01/2013  . Obesity   . Occult blood positive stool 10/17/2014  . Open ankle fracture 09/02/2015  . OSA (obstructive sleep apnea)    not using CPAP (09/30/2013)  . Pain of right thumb 04/03/2013  . Pneumonia   . PPD positive 04/08/2015  . Pre-ulcerative corn or callous 02/06/2013  . Restless legs   . Rotator cuff tear arthropathy of both shoulders 06/10/2013   History of bilateral shoulder cuff surgery for rotator cuff  tears. Reports increase in pain 09/11/2015 during physical therapy of the left shoulder.   [name] SCIATICA, LEFT 04/19/2010   Qualifier: Diagnosis of  By: [name], [name]   . Spinal stenosis in cervical region 09/26/2013  . Spinal stenosis, lumbar region, with neurogenic claudication 09/26/2013  . Type II diabetes mellitus ([place]) 2012   no meds in 09/2014.   [name] Uncontrolled type 2 DM with peripheral circulatory disorder ([place]) 10/04/2013  . URETHRAL STRICTURE 06/24/2009   self catheterizes.     Past Surgical History:  Procedure Laterality Date  . AMPUTATION Right 06/14/2016   Procedure: AMPUTATION BELOW KNEE;  Surgeon: Newt Minion, MD;  Location: [facility];  Service: Orthopedics;  Laterality: Right;  . ANKLE FUSION Right 04/15/2014   Procedure: Right  Subtalar, Talonavicular Fusion;  Surgeon: Newt Minion, MD;  Location: [facility];  Service: Orthopedics;  Laterality: Right;  . ANKLE FUSION Right 04/18/2016   Right Ankle Tibiocalcaneal Fusion; Removal of deep retained hardware; application of wound vac/notes 04/18/2016  . ANKLE FUSION Right 04/18/2016   Procedure: Right Ankle Tibiocalcaneal Fusion;  Surgeon: Newt Minion, MD;  Location: [facility];  Service: Orthopedics;  Laterality: Right;  . ANTERIOR CERVICAL DECOMP/DISCECTOMY FUSION N/A 09/26/2013   Procedure: ANTERIOR CERVICAL DISCECTOMY FUSION C3-4, plate and screw fixation, allograft bone graft;  Surgeon: Jessy Oto, MD;  Location: [facility];  Service: Orthopedics;  Laterality: N/A;  . BACK SURGERY    . BELOW KNEE LEG AMPUTATION Right 06/14/2016  . CARDIAC CATHETERIZATION  X 1  . CARPAL TUNNEL RELEASE Bilateral   . COLONOSCOPY    . COLONOSCOPY N/A 10/22/2014   Procedure: COLONOSCOPY;  Surgeon: Lafayette Dragon, MD;  Location[facility] ENDOSCOPY;  Service: Endoscopy;  Laterality: N/A;  . CORONARY ANGIOPLASTY WITH STENT PLACEMENT     "I have 9 stents"  . ESOPHAGOGASTRODUODENOSCOPY N/A 10/19/2014   Procedure: ESOPHAGOGASTRODUODENOSCOPY (EGD);  Surgeon: Jerene Bears, MD;  Location[facility] ENDOSCOPY;  Service: Endoscopy;  Laterality: N/A;  . FUSION OF TALONAVICULAR JOINT Right 04/15/2014   dr duda  . HERNIA REPAIR     umbilical  . INGUINAL HERNIA REPAIR Right 05/11/2015   Procedure: LAPAROSCOPIC REPAIR RIGHT  INGUINAL HERNIA;  Surgeon: Greer Pickerel, MD;  Location: [facility];  Service: General;  Laterality: Right;  . INSERTION OF MESH Right 05/11/2015   Procedure: INSERTION OF MESH;  Surgeon: Greer Pickerel, MD;  Location: [facility];  Service: General;  Laterality: Right;  . JOINT REPLACEMENT Bilateral    knees  . KNEE CARTILAGE SURGERY Right X 12   "~ 1/2 open; ~ 1/2 scopes"  . KNEE CARTILAGE SURGERY Left X 3   "3 scopes"  . LEFT HEART CATHETERIZATION WITH CORONARY ANGIOGRAM N/A 02/10/2013   Procedure: LEFT HEART  CATHETERIZATION WITH CORONARY ANGIOGRAM;  Surgeon: Burnell Blanks, MD;  Location: [facility] CATH LAB;  Service: Cardiovascular;  Laterality: N/A;  . LUMBAR LAMINECTOMY/DECOMPRESSION MICRODISCECTOMY N/A 01/27/2014   Procedure: CENTRAL LUMBAR LAMINECTOMY L4-5 AND L3-4;  Surgeon: Jessy Oto, MD;  Location: [facility];  Service: Orthopedics;  Laterality: N/A;  . ORIF ANKLE FRACTURE Right 09/02/2015   Procedure: OPEN REDUCTION INTERNAL FIXATION (ORIF) ANKLE FRACTURE;  Surgeon: Newt Minion, MD;  Location: [facility];  Service: Orthopedics;  Laterality: Right;  . PERIPHERALLY INSERTED CENTRAL CATHETER INSERTION  09/02/2015  . SHOULDER ARTHROSCOPY W/ ROTATOR CUFF REPAIR Bilateral    "3 on the right; 1 on the left"  . SKIN SPLIT GRAFT Right 10/01/2015   Procedure:  RIGHT ANKLE APPLY SKIN GRAFT SPLIT THICKNESS;  Surgeon: Newt Minion, MD;  Location: [facility];  Service: Orthopedics;  Laterality: Right;  . TONSILLECTOMY    . TOTAL KNEE ARTHROPLASTY Bilateral 2008  . UMBILICAL HERNIA REPAIR     UHR  . URETHRAL DILATION  X 4  . VASECTOMY      There were no vitals filed for this visit.  Subjective Assessment - 11/26/17 0937    Subjective  The patient reports that he is in 9/10 pain.  He helped his brother with a catfish fry and stood for 4 hours intermittently.  He also pulled a right groin muscle when walking uphill.     Pertinent History  Extensive medical history reviewed in EPIC.  R BKA with postural compensations, h/o back surgery approx 4 years ago, h/o neck surgery.  Bilateral shoulder pain (can't raise R arm due to RTC tear), h/o knee replacements.  Obesity, sleep apnea.     Patient Stated Goals  Improve "the ability to walk and stand without pain."    Currently in Pain?  Yes    Pain Score  9     Pain Location  Back and right groin (eases up with sitting)    Pain Orientation  Lower    Pain Descriptors / Indicators  Stabbing    Pain Type  Chronic pain    Pain Onset  More than a month ago    Pain  Frequency  Constant    Aggravating Factors   exercises, standing/walking    Pain Relieving Factors  lying down                       [facility] Adult PT Treatment/Exercise - 11/26/17 1619      Self-Care   Self-Care  Other Self-Care Comments    Other Self-Care Comments   PT showed patient in mirror alignment and goals to align/improve position.  Also discussed need to see prosthetist in order to improve equality of leglength (needed 1-2" longer pylon).  Also discussed aquatics and potential to work in the pool to reduce pain.      Neuro Re-ed    Neuro Re-ed Details   Standing right weight shift activities in the parallel bars with mirror for visual feedback.  Engaged core muscles when in improved alignment for muscle re-education.  Standing L reaching for L trunk elongation.      Exercises   Exercises  Other Exercises    Other Exercises   STANDING: in parallel bars patient performed hip hike/depression with tactile cues, single leg activities attempting to stand (with B UE support) on R LE with hip stabilization and min A.  SUPINE:  Gentle lumbar roll with legs supported on physioball (within tolerable range of motion).  Attempted hip flexion with legs supported on ball, however increases low back pain.  Utilized trunk flexion in sitting to reduce pain.      Manual Therapy   Manual Therapy  Muscle Energy Technique    Manual therapy comments  For purposes of improved postural alignment    Muscle Energy Technique  Used a sheet around ribs on R side with L lateral pull.  Provided counterpressure at L lateral hip to improve alignment.   Stood with R foot on 2" block to improve postural alignment.  Then provided contract/relax and core stability.               PT Short Term Goals - 11/01/17 2155  PT SHORT TERM GOAL #1   Title  The aptient will be indep with HEP for LE stretching, core stability, hip strengthening, and general mobilty.    Time  4    Period  Weeks     Status  New    Target Date  12/01/17      PT SHORT TERM GOAL #2   Title  The patient will report resting pain < or equal to 3/10.    Baseline  5/10 at rest    Time  4    Period  Weeks    Status  New    Target Date  12/01/17      PT SHORT TERM GOAL #3   Title  The patient will improve L hip flexion from 3/5 (due to pain) up to 4/5.    Time  4    Period  Weeks    Status  New    Target Date  12/01/17      PT SHORT TERM GOAL #4   Title  The patient will tolerate L knee to chest with L SI region pain < or equal to 5/10 (10/10 today).    Time  4    Period  Weeks    Status  New    Target Date  12/01/17        PT Long Term Goals - 11/15/17 1145      PT LONG TERM GOAL #1   Title  The patient will be further assessed on gait speed and goal to follow.    Time  8    Period  Weeks    Status  Achieved      PT LONG TERM GOAL #2   Title  The patient will be indep with progresssion of HEP.    Time  8    Period  Weeks      PT LONG TERM GOAL #3   Title  The patient will verbalize understanding of pain management techniques to manage chronic pain.    Time  8    Period  Weeks      PT LONG TERM GOAL #4   Title  The patinet will improve bilateral LE hip flexion to 5/5.    Time  8    Period  Weeks      PT LONG TERM GOAL #5   Title  Pt will improve gait speed with LRAD to >/=3.34f/sec to safely amb. in the community and to make significant improvements.     [facility]  Status  New            Plan - 11/26/17 1954    Clinical Impression Statement  The patient continues to note increase pain with therapy/exercise.  He also reported being out of hydrocodone and standing x hours this weekend, which could both also be increasing pain level today.  PT to address postural alignment and begin checking STGs.     PT Treatment/Interventions  ADLs/Self Care Home Management;Therapeutic exercise;Balance training;Neuromuscular re-education;Patient/family education;Gait  training;Therapeutic activities;Prosthetic Training;Dry needling;Manual techniques;Cryotherapy;Moist Heat;Traction    PT Next Visit Plan   prosthetic weight shift,Check HEP prn, perform SI muscle energy, hip flexor strengthening, SI mobilization to tolerance, standing trunk flexion leaning with back to wall for support (williams flexion to relieve stenosis pain).      Consulted and Agree with Plan of Care  Patient       Patient will benefit from skilled therapeutic intervention in order to improve the  following deficits and impairments:  Abnormal gait, Hypomobility, Obesity, Decreased activity tolerance, Decreased mobility, Decreased strength, Pain, Increased muscle spasms, Impaired flexibility, Postural dysfunction  Visit Diagnosis: Low back pain with sciatica, sciatica laterality unspecified, unspecified back pain laterality, unspecified chronicity  Muscle weakness (generalized)  Other abnormalities of gait and mobility  Abnormal posture     Problem List Patient Active Problem List   Diagnosis Date Noted  . Morbid obesity ([place]) 10/09/2017  . Exposure to sexually transmitted disease (STD) 07/14/2017  . Low back pain 07/13/2017  . Leukoplakia, tongue 01/26/2017  . Acute sinus infection 10/20/2016  . Hyperglycemia 10/20/2016  . Skin lesion 10/20/2016  . S/P unilateral BKA (below knee amputation), right ([place]) 06/14/2016  . Charcot foot due to diabetes mellitus ([place])   . Charcot's arthropathy associated with type 2 diabetes mellitus ([place]) 04/11/2016  . Encounter for well adult exam with abnormal findings 11/05/2015  . Major depression 09/13/2015  . S/P TKR (total knee replacement) bilaterally 09/13/2015  . GERD (gastroesophageal reflux disease) 09/08/2015  . S/P laparoscopic hernia repair 05/11/2015  . PPD positive 04/08/2015  . Benign neoplasm of descending colon   . Benign neoplasm of cecum   . AKI (acute kidney injury) ([place]) 10/18/2014  . Acute blood loss anemia   . Chronic  anticoagulation   . Occult blood positive stool 10/17/2014  . General weakness 07/14/2014  . Urinary incontinence 07/14/2014  . Headache(784.0) 10/15/2013  . Spinal stenosis in cervical region 09/26/2013    Class: Chronic  . Spinal stenosis, lumbar region, with neurogenic claudication 09/26/2013    Class: Chronic  . Hand joint pain 06/10/2013  . Rotator cuff tear arthropathy of both shoulders 06/10/2013  . Skin lesion of cheek 05/01/2013  . Pain of right thumb 04/03/2013  . Balance disorder 03/12/2013  . Gait disorder 03/12/2013  . Tremor 03/12/2013  . Left hip pain 03/12/2013  . Pre-ulcerative corn or callous 02/06/2013  . Anxiety 11/12/2011  . OSA (obstructive sleep apnea) 11/07/2011  . Bradycardia 10/20/2011  . Insomnia 10/04/2011  . Obesity 01/12/2011  . ERECTILE DYSFUNCTION, ORGANIC 05/30/2010  . [place] DISEASE, LUMBAR 04/19/2010  . SCIATICA, LEFT 04/19/2010  . Chronic pain syndrome 10/27/2009  . Hyperlipidemia 07/15/2009  . Essential hypertension 06/24/2009  . CAD (coronary artery disease) 06/24/2009  . Allergic rhinitis 06/24/2009  . URETHRAL STRICTURE 06/24/2009  . DEGENERATIVE JOINT DISEASE 06/24/2009  . SHOULDER PAIN, BILATERAL 06/24/2009  . FATIGUE 06/24/2009  . NEPHROLITHIASIS, HX OF 06/24/2009    Latania Bascomb, PT 11/26/2017, 7:56 PM  [facility] [address] [place], [place], [address] Phone: [phone]   Fax:  [phone]  Name: Travis Barbato Sr. MRN: [id number] Date of Birth: 1951-03-29

## 2017-11-26 NOTE — Telephone Encounter (Signed)
Prior authorization received for Cogentin. Submitted online with cover my meds. Awaiting response.

## 2017-11-28 ENCOUNTER — Ambulatory Visit: Payer: Medicare HMO | Admitting: Rehabilitative and Restorative Service Providers"

## 2017-11-28 ENCOUNTER — Encounter: Payer: Self-pay | Admitting: Rehabilitative and Restorative Service Providers"

## 2017-11-28 DIAGNOSIS — R6889 Other general symptoms and signs: Secondary | ICD-10-CM | POA: Diagnosis not present

## 2017-11-28 DIAGNOSIS — M6281 Muscle weakness (generalized): Secondary | ICD-10-CM

## 2017-11-28 DIAGNOSIS — R293 Abnormal posture: Secondary | ICD-10-CM | POA: Diagnosis not present

## 2017-11-28 DIAGNOSIS — M5440 Lumbago with sciatica, unspecified side: Secondary | ICD-10-CM | POA: Diagnosis not present

## 2017-11-28 DIAGNOSIS — R2689 Other abnormalities of gait and mobility: Secondary | ICD-10-CM | POA: Diagnosis not present

## 2017-11-28 NOTE — Therapy (Signed)
[facility] [address] [place], [place], [address] Phone: [phone]   Fax:  [phone]  Physical Therapy Treatment and Discharge Summary  Patient Details  Name: Travis Jou Sr. MRN: [id number] Date of Birth: 01/27/1951 Referring Provider: [name], MD   Encounter Date: 11/28/2017  PT End of Session - 11/28/17 1116    Visit Number  6    Number of Visits  12    Date for PT Re-Evaluation  12/31/17    Authorization Type  [employer] medicare and medicaid    PT Start Time  1110    PT Stop Time  1150    PT Time Calculation (min)  40 min    Activity Tolerance  Patient limited by pain;No increased pain    Behavior During Therapy  WFL for tasks assessed/performed       Past Medical History:  Diagnosis Date  . ALLERGIC RHINITIS 06/24/2009  . Allergic rhinitis 06/24/2009   Qualifier: Diagnosis of  By: [name], [name]   . Anxiety 11/12/2011   Adequate for discharge   . Arthritis    "all my joints" (09/30/2013)  . Arthritis of foot, right, degenerative 04/15/2014  . Balance disorder 03/12/2013  . Benign neoplasm of cecum   . Benign neoplasm of descending colon   . CAD (coronary artery disease) 06/24/2009   5 stents placed in 2007    . Chronic anticoagulation   . Chronic pain syndrome 10/27/2009   of ankle, shoulders, low back.  sciatica.   . Closed fracture of right foot 10/17/2014  . CORONARY ARTERY DISEASE 06/24/2009   a. s/p multiple PCIs - In 2008 he had a Taxus DES to the mild LAD, Endeavor DES to mid LCX and distal LCX. In January 2009 he had DES to distal LCX, mid LCX and proximal LCX. In November 2009 had BMS x 2 to the mid RCA. Cath 10/2011 with patent stents, noncardiac CP. LHC 01/2013: patent stents (noncardiac CP).  . DEGENERATIVE JOINT DISEASE 06/24/2009   Qualifier: Diagnosis of  By: [name], [name]   . Depression   . Depression with anxiety    Prior suicide attempt  . [place] DISEASE, LUMBAR 04/19/2010  . ERECTILE  DYSFUNCTION, ORGANIC 05/30/2010  . Essential hypertension 06/24/2009   Qualifier: Diagnosis of  By: [name], [name]   . Fibromyalgia   . Fracture dislocation of ankle joint 09/02/2015  . Gait disorder 03/12/2013  . General weakness 07/14/2014  . GERD (gastroesophageal reflux disease) 09/08/2015  . Hand joint pain 06/10/2013  . Heart murmur   . Hepatitis C   . History of kidney stones   . Hyperlipidemia 07/15/2009   Qualifier: Diagnosis of  By: Aundra Dubin, MD, [place]    . HYPERLIPIDEMIA-MIXED 07/15/2009  . HYPERTENSION 06/24/2009  . Insomnia 10/04/2011  . Irregular heart beat   . Left hip pain 03/12/2013   Injected under ultrasound guidance on June 24, 2013   . Major depression 09/13/2015  . Myocardial infarction ([place]) 2008  . Non-cardiac chest pain 10/2011, 01/2013  . Obesity   . Occult blood positive stool 10/17/2014  . Open ankle fracture 09/02/2015  . OSA (obstructive sleep apnea)    not using CPAP (09/30/2013)  . Pain of right thumb 04/03/2013  . Pneumonia   . PPD positive 04/08/2015  . Pre-ulcerative corn or callous 02/06/2013  . Restless legs   . Rotator cuff tear arthropathy of both shoulders 06/10/2013   History of bilateral shoulder cuff surgery  for rotator cuff tears. Reports increase in pain 09/11/2015 during physical therapy of the left shoulder.   [name] SCIATICA, LEFT 04/19/2010   Qualifier: Diagnosis of  By: [name], [name]   . Spinal stenosis in cervical region 09/26/2013  . Spinal stenosis, lumbar region, with neurogenic claudication 09/26/2013  . Type II diabetes mellitus ([place]) 2012   no meds in 09/2014.   [name] Uncontrolled type 2 DM with peripheral circulatory disorder ([place]) 10/04/2013  . URETHRAL STRICTURE 06/24/2009   self catheterizes.     Past Surgical History:  Procedure Laterality Date  . AMPUTATION Right 06/14/2016   Procedure: AMPUTATION BELOW KNEE;  Surgeon: Newt Minion, MD;  Location: [facility];  Service: Orthopedics;  Laterality: Right;  . ANKLE FUSION Right 04/15/2014    Procedure: Right Subtalar, Talonavicular Fusion;  Surgeon: Newt Minion, MD;  Location: [facility];  Service: Orthopedics;  Laterality: Right;  . ANKLE FUSION Right 04/18/2016   Right Ankle Tibiocalcaneal Fusion; Removal of deep retained hardware; application of wound vac/notes 04/18/2016  . ANKLE FUSION Right 04/18/2016   Procedure: Right Ankle Tibiocalcaneal Fusion;  Surgeon: Newt Minion, MD;  Location: [facility];  Service: Orthopedics;  Laterality: Right;  . ANTERIOR CERVICAL DECOMP/DISCECTOMY FUSION N/A 09/26/2013   Procedure: ANTERIOR CERVICAL DISCECTOMY FUSION C3-4, plate and screw fixation, allograft bone graft;  Surgeon: Jessy Oto, MD;  Location: [facility];  Service: Orthopedics;  Laterality: N/A;  . BACK SURGERY    . BELOW KNEE LEG AMPUTATION Right 06/14/2016  . CARDIAC CATHETERIZATION  X 1  . CARPAL TUNNEL RELEASE Bilateral   . COLONOSCOPY    . COLONOSCOPY N/A 10/22/2014   Procedure: COLONOSCOPY;  Surgeon: Lafayette Dragon, MD;  [facility];  Service: Endoscopy;  Laterality: N/A;  . CORONARY ANGIOPLASTY WITH STENT PLACEMENT     "I have 9 stents"  . ESOPHAGOGASTRODUODENOSCOPY N/A 10/19/2014   Procedure: ESOPHAGOGASTRODUODENOSCOPY (EGD);  Surgeon: Jerene Bears, MD;  Location: [facility] ENDOSCOPY;  Service: Endoscopy;  Laterality: N/A;  . FUSION OF TALONAVICULAR JOINT Right 04/15/2014   dr duda  . HERNIA REPAIR     umbilical  . INGUINAL HERNIA REPAIR Right 05/11/2015   Procedure: LAPAROSCOPIC REPAIR RIGHT  INGUINAL HERNIA;  Surgeon: Greer Pickerel, MD;  Location: [facility];  Service: General;  Laterality: Right;  . INSERTION OF MESH Right 05/11/2015   Procedure: INSERTION OF MESH;  Surgeon: Greer Pickerel, MD;  Location: [facility];  Service: General;  Laterality: Right;  . JOINT REPLACEMENT Bilateral    knees  . KNEE CARTILAGE SURGERY Right X 12   "~ 1/2 open; ~ 1/2 scopes"  . KNEE CARTILAGE SURGERY Left X 3   "3 scopes"  . LEFT HEART CATHETERIZATION WITH CORONARY ANGIOGRAM N/A 02/10/2013   Procedure:  LEFT HEART CATHETERIZATION WITH CORONARY ANGIOGRAM;  Surgeon: Burnell Blanks, MD;  Location: [facility] CATH LAB;  Service: Cardiovascular;  Laterality: N/A;  . LUMBAR LAMINECTOMY/DECOMPRESSION MICRODISCECTOMY N/A 01/27/2014   Procedure: CENTRAL LUMBAR LAMINECTOMY L4-5 AND L3-4;  Surgeon: Jessy Oto, MD;  Location: [facility];  Service: Orthopedics;  Laterality: N/A;  . ORIF ANKLE FRACTURE Right 09/02/2015   Procedure: OPEN REDUCTION INTERNAL FIXATION (ORIF) ANKLE FRACTURE;  Surgeon: Newt Minion, MD;  Location: [facility];  Service: Orthopedics;  Laterality: Right;  . PERIPHERALLY INSERTED CENTRAL CATHETER INSERTION  09/02/2015  . SHOULDER ARTHROSCOPY W/ ROTATOR CUFF REPAIR Bilateral    "3 on the right; 1 on the left"  . SKIN SPLIT GRAFT Right 10/01/2015  Procedure: RIGHT ANKLE APPLY SKIN GRAFT SPLIT THICKNESS;  Surgeon: Newt Minion, MD;  Location: [facility];  Service: Orthopedics;  Laterality: Right;  . TONSILLECTOMY    . TOTAL KNEE ARTHROPLASTY Bilateral 2008  . UMBILICAL HERNIA REPAIR     UHR  . URETHRAL DILATION  X 4  . VASECTOMY      There were no vitals filed for this visit.  Subjective Assessment - 11/28/17 1114    Subjective  The patient got pylon on R prosthesis modified to be 1.5" taller.  He notes he will pick up medications for pain today (he was out of hydrocodone this week).  Back pain is worse with pylon change and exercises.  He is at 8/10 pain level at rest.      Pertinent History  Extensive medical history reviewed in EPIC.  R BKA with postural compensations, h/o back surgery approx 4 years ago, h/o neck surgery.  Bilateral shoulder pain (can't raise R arm due to RTC tear), h/o knee replacements.  Obesity, sleep apnea.     Patient Stated Goals  Improve "the ability to walk and stand without pain."    Currently in Pain?  Yes    Pain Score  8     Pain Location  Back    Pain Orientation  Left;Lower    Pain Descriptors / Indicators  Stabbing;Constant;Throbbing    Pain Type  Chronic  pain    Pain Onset  More than a month ago    Pain Frequency  Constant    Aggravating Factors   standing and walking, worse with exercise    Pain Relieving Factors  lying down, rest         [facility] PT Assessment - 11/28/17 1123      Special Tests   Other special tests  Measured leg length today on modified prosthesis (patient has 23.25" right from popliteal fold to heel and 22" on the left).  Prosthetist added 1.5" due to functional scoliosis developing.                   [facility] Adult PT Treatment/Exercise - 11/28/17 1130      Self-Care   Self-Care  Other Self-Care Comments    Other Self-Care Comments   Discussed PT plan and patient notes he feels worse with current exercises/ attempts at increasing activity level.  PT educated patient on weight management and patient reports he is seeking information regarding bariatric surgery.  We discussed that weight management would be helpful to improve long term outcomes related to back pain and acknowledged that weight loss is difficult considering current pain level and dec'd tolerance to movement.  Patient may benefit from aquatics if obtains a water sleeve for his prosthesis.       Exercises   Exercises  Other Exercises    Other Exercises   Reviewed supine core stabilization, supine marching, PROM hamstring stretching today, sidelying hip depression to tolerance x 5 reps each side, hip abduction (led to greater pain).  Performed L sidelying propping on elbow for lumbar/QL stretching.  Patient noted increasing pain with basic core exercises.               PT Education - 11/28/17 1947    Education Details  PT discussed home exercise program and continued attempts at gentle movement to maintain mobility and continue to get muscle activation    Person(s) Educated  Patient    Methods  Explanation    Comprehension  Verbalized understanding  PT Short Term Goals - 11/28/17 1948      PT SHORT TERM GOAL #1   Title  The  aptient will be indep with HEP for LE stretching, core stability, hip strengthening, and general mobilty.    Baseline  Patient has HEP, however has increased pain with performance of any exercise. He is reporting 9/10 pain during therapy sessions with minimal exercise.     Time  4    Period  Weeks    Status  Achieved      PT SHORT TERM GOAL #2   Title  The patient will report resting pain < or equal to 3/10.    Baseline  5/10 at rest; pain has increased with attempt to perform postural realignment and ther ex.     Time  4    Period  Weeks    Status  Not Met      PT SHORT TERM GOAL #3   Title  The patient will improve L hip flexion from 3/5 (due to pain) up to 4/5.    Baseline  Unable to test due to severe pain.    Time  4    Period  Weeks    Status  Not Met      PT SHORT TERM GOAL #4   Title  The patient will tolerate L knee to chest with L SI region pain < or equal to 5/10 (10/10 today).    Baseline  The patient has increased pain with all mobility reporting 9/10 pain.    Time  4    Period  Weeks    Status  Not Met        PT Long Term Goals - 11/28/17 2000      PT LONG TERM GOAL #1   Title  The patient will be further assessed on gait speed and goal to follow.    Time  8    Period  Weeks    Status  Achieved      PT LONG TERM GOAL #2   Title  The patient will be indep with progresssion of HEP.    Time  8    Period  Weeks    Status  Not Met      PT LONG TERM GOAL #3   Title  The patient will verbalize understanding of pain management techniques to manage chronic pain.    Time  8    Period  Weeks    Status  Not Met      PT LONG TERM GOAL #4   Title  The patinet will improve bilateral LE hip flexion to 5/5.    Time  8    Period  Weeks    Status  Not Met      PT LONG TERM GOAL #5   Title  Pt will improve gait speed with LRAD to >/=3.89f/sec to safely amb. in the community and to make significant improvements.     [facility]  Status  Not  Met            Plan - 11/28/17 2038    Clinical Impression Statement  The patient continues to report significant increase in pain with exercises.  At initial evaluation he noted 5/10 pain.  PT has worked on basic lumbar/core stabilization in supine, standing alignment, and gait mechanics.  We also have tried to adjust prosthetic height for more postural symmetry.  The patient continues with L hip lateral instability which  significantly drops the right hip leading to a functional scoliosis.  The patient also may have functional scoliosis due to L low back antalgia and unweighting the left side.  He also has gait abnormality with dec'd R knee extension leading to further functional shortening of the right leg.  There are multiple factors influencing postural alignment and PT feels that we cannot gain further symmetry due to severe pain.   The patient feels exercise is making pain worse.     PT Treatment/Interventions  ADLs/Self Care Home Management;Therapeutic exercise;Balance training;Neuromuscular re-education;Patient/family education;Gait training;Therapeutic activities;Prosthetic Training;Dry needling;Manual techniques;Cryotherapy;Moist Heat;Traction    PT Next Visit Plan  Discharge at this time-  recommended patient return to MD due to worsening pain with PT intervention.    Consulted and Agree with Plan of Care  Patient       Patient will benefit from skilled therapeutic intervention in order to improve the following deficits and impairments:  Abnormal gait, Hypomobility, Obesity, Decreased activity tolerance, Decreased mobility, Decreased strength, Pain, Increased muscle spasms, Impaired flexibility, Postural dysfunction  Visit Diagnosis: Low back pain with sciatica, sciatica laterality unspecified, unspecified back pain laterality, unspecified chronicity  Muscle weakness (generalized)  Other abnormalities of gait and mobility  Abnormal posture  PHYSICAL THERAPY DISCHARGE  SUMMARY  Visits from Start of Care: 6  Current functional level related to goals / functional outcomes: See above   Remaining deficits: Patient has worsening pain with all activities except seated lumbar flexion.   Education / Equipment:  PT encouraged home program within tolerance (core stability, hamstring stretching, trunk flexion).  Patient to f/u with physician due to worsening pain. Plan: Patient agrees to discharge.  Patient goals were not met. Patient is being discharged due to lack of progress.  ?????         Thank you for the referral of this patient. Rudell Cobb, MPT   [place], PT 11/28/2017, 8:48 PM  [facility] [address] [place], [place], [address] Phone: [phone]   Fax:  [phone]  Name: Keldan Eplin Sr. MRN: [id number] Date of Birth: 05-Mar-1951

## 2017-11-28 NOTE — Patient Instructions (Addendum)
Access Code: [email or web address]  URL: [email or web address]  Date: 11/16/2017  Prepared by: Rudell Cobb   Exercises Seated Lumbar Flexion Stretch - 3 reps - 1 sets - 30-60 seconds hold - 1x daily - 7x weekly Supine March - 5 reps - 3 sets - 1x daily - 7x weekly Supine Lower Trunk Rotation - 10 reps - 1 sets - 2 hold - 1x daily - 7x weekly Hooklying Single Knee to Chest Stretch - 3 reps - 1 sets - 30 hold - 1x daily - 7x weekly Supine Posterior Pelvic Tilt with Pelvic Floor Contraction - 5 reps - 3 sets - 1x daily - 7x weekly Seated Hamstring Stretch - 3 reps - 1 sets - 30 seconds hold - 1x daily - 7x weekly

## 2017-12-03 ENCOUNTER — Ambulatory Visit: Payer: Self-pay | Admitting: Rehabilitative and Restorative Service Providers"

## 2017-12-03 ENCOUNTER — Ambulatory Visit (INDEPENDENT_AMBULATORY_CARE_PROVIDER_SITE_OTHER): Payer: Medicare HMO | Admitting: Psychology

## 2017-12-03 DIAGNOSIS — R6889 Other general symptoms and signs: Secondary | ICD-10-CM | POA: Diagnosis not present

## 2017-12-03 DIAGNOSIS — F332 Major depressive disorder, recurrent severe without psychotic features: Secondary | ICD-10-CM | POA: Diagnosis not present

## 2017-12-06 ENCOUNTER — Ambulatory Visit: Payer: Self-pay | Admitting: Rehabilitation

## 2017-12-07 ENCOUNTER — Other Ambulatory Visit: Payer: Self-pay | Admitting: Internal Medicine

## 2017-12-12 ENCOUNTER — Encounter: Payer: Self-pay | Admitting: *Deleted

## 2017-12-15 ENCOUNTER — Other Ambulatory Visit (HOSPITAL_COMMUNITY): Payer: Self-pay | Admitting: Psychiatry

## 2017-12-15 DIAGNOSIS — F319 Bipolar disorder, unspecified: Secondary | ICD-10-CM

## 2017-12-18 ENCOUNTER — Other Ambulatory Visit (HOSPITAL_COMMUNITY): Payer: Self-pay

## 2017-12-18 ENCOUNTER — Ambulatory Visit (HOSPITAL_COMMUNITY): Payer: Self-pay | Admitting: Psychiatry

## 2017-12-18 DIAGNOSIS — F319 Bipolar disorder, unspecified: Secondary | ICD-10-CM

## 2017-12-18 DIAGNOSIS — F411 Generalized anxiety disorder: Secondary | ICD-10-CM

## 2017-12-18 DIAGNOSIS — R6889 Other general symptoms and signs: Secondary | ICD-10-CM | POA: Diagnosis not present

## 2017-12-18 MED ORDER — TRAZODONE HCL 150 MG PO TABS: 150.0000 mg | ORAL_TABLET | Freq: Every day | ORAL | 0 refills | Status: DC

## 2017-12-18 MED ORDER — BENZTROPINE MESYLATE 0.5 MG PO TABS: 0.5000 mg | ORAL_TABLET | Freq: Every day | ORAL | 0 refills | Status: DC

## 2017-12-18 MED ORDER — DULOXETINE HCL 20 MG PO CPEP: ORAL_CAPSULE | ORAL | 0 refills | Status: DC

## 2017-12-18 MED ORDER — ARIPIPRAZOLE 5 MG PO TABS: 5.0000 mg | ORAL_TABLET | Freq: Every day | ORAL | 0 refills | Status: DC

## 2017-12-19 ENCOUNTER — Telehealth: Payer: Self-pay | Admitting: Nurse Practitioner

## 2017-12-19 NOTE — Telephone Encounter (Signed)
Phone call to patient to verify medication list and allergies for myelogram procedure. Pt instructed to stop trazodone cymbalta and abilify 48hrs prior to appointment time. Pt also instructed he will need to stop Effient 7 days prior to myelogram procedure (pending cardiologist Dr. Saunders Revel approval). Pt verbalized understanding. Thinner hold request faxed to cardiologist, awaiting approval.

## 2017-12-20 ENCOUNTER — Ambulatory Visit (INDEPENDENT_AMBULATORY_CARE_PROVIDER_SITE_OTHER): Payer: Medicare HMO | Admitting: Internal Medicine

## 2017-12-20 ENCOUNTER — Ambulatory Visit (INDEPENDENT_AMBULATORY_CARE_PROVIDER_SITE_OTHER)
Admission: RE | Admit: 2017-12-20 | Discharge: 2017-12-20 | Disposition: A | Discharge status: Home / Self Care | Payer: Medicare HMO | Source: Ambulatory Visit | Attending: Internal Medicine | Admitting: Internal Medicine

## 2017-12-20 ENCOUNTER — Encounter: Payer: Self-pay | Admitting: Internal Medicine

## 2017-12-20 ENCOUNTER — Other Ambulatory Visit: Payer: Self-pay | Admitting: Internal Medicine

## 2017-12-20 ENCOUNTER — Telehealth: Payer: Self-pay

## 2017-12-20 VITALS — BP 136/84 | HR 80 | Temp 98.1°F | Ht 72.0 in | Wt 317.0 lb

## 2017-12-20 DIAGNOSIS — R1030 Lower abdominal pain, unspecified: Secondary | ICD-10-CM

## 2017-12-20 DIAGNOSIS — I10 Essential (primary) hypertension: Secondary | ICD-10-CM | POA: Diagnosis not present

## 2017-12-20 DIAGNOSIS — G894 Chronic pain syndrome: Secondary | ICD-10-CM | POA: Diagnosis not present

## 2017-12-20 DIAGNOSIS — R6889 Other general symptoms and signs: Secondary | ICD-10-CM | POA: Diagnosis not present

## 2017-12-20 DIAGNOSIS — M47816 Spondylosis without myelopathy or radiculopathy, lumbar region: Secondary | ICD-10-CM | POA: Diagnosis not present

## 2017-12-20 DIAGNOSIS — R1031 Right lower quadrant pain: Secondary | ICD-10-CM

## 2017-12-20 DIAGNOSIS — Z0000 Encounter for general adult medical examination without abnormal findings: Secondary | ICD-10-CM

## 2017-12-20 MED ORDER — HYDROCODONE-ACETAMINOPHEN 10-325 MG PO TABS: ORAL_TABLET | ORAL | 0 refills | Status: DC

## 2017-12-20 NOTE — Assessment & Plan Note (Signed)
stable overall by history and exam, recent data reviewed with pt, and pt to continue medical treatment as before,  to f/u any worsening symptoms or concerns BP Readings from Last 3 Encounters:  12/20/17 136/84  11/12/17 (!) 143/66  10/22/17 (!) 150/70

## 2017-12-20 NOTE — Assessment & Plan Note (Addendum)
I suspect very severe arthritis right hip, possible elsewhere as well in the back and pelvis area; for plain films, pain control , refer pain management as per pt request, f/u ortho tomorrow as planned

## 2017-12-20 NOTE — Patient Instructions (Signed)
Please take all new medication as prescribed - the pain medication as needed  Please continue all other medications as before, and refills have been done if requested.  Please have the pharmacy call with any other refills you may need.  Please continue your efforts at being more active, low cholesterol diet, and weight control.  You are otherwise up to date with prevention measures today.  Please keep your appointments with your specialists as you may have planned - orthopedic tomorrow  You will be contacted regarding the referral for: pain management, as well as eye doctor  Please go to the XRAY Department in the Basement (go straight as you get off the elevator) for the x-ray testing  You will be contacted by phone if any changes need to be made immediately.  Otherwise, you will receive a letter about your results with an explanation, but please check with MyChart first.  Please remember to sign up for MyChart if you have not done so, as this will be important to you in the future with finding out test results, communicating by private email, and scheduling acute appointments online when needed.

## 2017-12-20 NOTE — Telephone Encounter (Signed)
   [facility] Pre-operative Risk Assessment    Request for surgical clearance:  1. What type of surgery is being performed? MYELOGRAM   2. When is this surgery scheduled? TBD   3. What type of clearance is required (medical clearance vs. Pharmacy clearance to hold med vs. Both)? Pharmacy  4. Are there any medications that need to be held prior to surgery and how long? Effient 7 days   5. Practice name and name of physician performing surgery? [facility]   6. What is your office phone number [phone]    7.   What is your office fax number [phone]  8.   Anesthesia type (None, local, MAC, general) ?    Legrand Como  Mahira Petras 12/20/2017, 2:25 PM  _________________________________________________________________   (provider comments below)

## 2017-12-20 NOTE — Assessment & Plan Note (Signed)
For pain management referral

## 2017-12-20 NOTE — Progress Notes (Signed)
Subjective:    Patient ID: Travis Fortis Sr., male    DOB: December 10, 1950, 67 y.o.   MRN: [id number]  HPI  Here to f/u, has been seeing psychiatry and Depression improve with change of prozac to cymbalta.  Mainly here today -  Also with "possible pulled groin" right for 5 wks, severe, can radiate toward the knee, constant, currently 5/10, stabbing quality, nothing makes better including ice, heat, msucle relaxer, or particular position.Walking up a grade makes worse. Incidenmtly has appt with Dr Louanne Skye Manson Passey tomorrow for /fu back pain.  Denies urinary symptoms such as dysuria, frequency, urgency, flank pain, hematuria or n/v, fever, chills. Denies worsening reflux, abd pain, dysphagia, n/v, bowel change or blood.  Does have some right testicle aching of an on, not sure if related.  Wondering about mesh malfunction in place since inguinal hernia surgury about 3 yrs. No other change in walking , walks with cane in right hand always no change.  No falls.  Did have right leg prosthesis lengthened by 1.5 inch to allow for leg length discrepancy 2 wks ago, but still not better.  Also wondering about appetite suppressant. Due for optho yearly. Past Medical History:  Diagnosis Date  . ALLERGIC RHINITIS 06/24/2009  . Allergic rhinitis 06/24/2009   Qualifier: Diagnosis of  By: [name], [name]   . Anxiety 11/12/2011   Adequate for discharge   . Arthritis    "all my joints" (09/30/2013)  . Arthritis of foot, right, degenerative 04/15/2014  . Balance disorder 03/12/2013  . Benign neoplasm of cecum   . Benign neoplasm of descending colon   . CAD (coronary artery disease) 06/24/2009   5 stents placed in 2007    . Chronic anticoagulation   . Chronic pain syndrome 10/27/2009   of ankle, shoulders, low back.  sciatica.   . Closed fracture of right foot 10/17/2014  . CORONARY ARTERY DISEASE 06/24/2009   a. s/p multiple PCIs - In 2008 he had a Taxus DES to the mild LAD, Endeavor DES to mid LCX and distal LCX. In January  2009 he had DES to distal LCX, mid LCX and proximal LCX. In November 2009 had BMS x 2 to the mid RCA. Cath 10/2011 with patent stents, noncardiac CP. LHC 01/2013: patent stents (noncardiac CP).  . DEGENERATIVE JOINT DISEASE 06/24/2009   Qualifier: Diagnosis of  By: [name], [name]   . Depression   . Depression with anxiety    Prior suicide attempt  . [place] DISEASE, LUMBAR 04/19/2010  . ERECTILE DYSFUNCTION, ORGANIC 05/30/2010  . Essential hypertension 06/24/2009   Qualifier: Diagnosis of  By: [name], [name]   . Fibromyalgia   . Fracture dislocation of ankle joint 09/02/2015  . Gait disorder 03/12/2013  . General weakness 07/14/2014  . GERD (gastroesophageal reflux disease) 09/08/2015  . Hand joint pain 06/10/2013  . Heart murmur   . Hepatitis C   . History of kidney stones   . Hyperlipidemia 07/15/2009   Qualifier: Diagnosis of  By: Aundra Dubin, MD, [place]    . HYPERLIPIDEMIA-MIXED 07/15/2009  . HYPERTENSION 06/24/2009  . Insomnia 10/04/2011  . Irregular heart beat   . Left hip pain 03/12/2013   Injected under ultrasound guidance on June 24, 2013   . Major depression 09/13/2015  . Myocardial infarction ([place]) 2008  . Non-cardiac chest pain 10/2011, 01/2013  . Obesity   . Occult blood positive stool 10/17/2014  . Open ankle fracture 09/02/2015  . OSA (obstructive sleep apnea)  not using CPAP (09/30/2013)  . Pain of right thumb 04/03/2013  . Pneumonia   . PPD positive 04/08/2015  . Pre-ulcerative corn or callous 02/06/2013  . Restless legs   . Rotator cuff tear arthropathy of both shoulders 06/10/2013   History of bilateral shoulder cuff surgery for rotator cuff tears. Reports increase in pain 09/11/2015 during physical therapy of the left shoulder.   [name] SCIATICA, LEFT 04/19/2010   Qualifier: Diagnosis of  By: [name], [name]   . Spinal stenosis in cervical region 09/26/2013  . Spinal stenosis, lumbar region, with neurogenic claudication 09/26/2013  . Type II diabetes mellitus ([place]) 2012   no meds  in 09/2014.   [name] Uncontrolled type 2 DM with peripheral circulatory disorder ([place]) 10/04/2013  . URETHRAL STRICTURE 06/24/2009   self catheterizes.    Past Surgical History:  Procedure Laterality Date  . AMPUTATION Right 06/14/2016   Procedure: AMPUTATION BELOW KNEE;  Surgeon: Newt Minion, MD;  Location: [place];  Service: Orthopedics;  Laterality: Right;  . ANKLE FUSION Right 04/15/2014   Procedure: Right Subtalar, Talonavicular Fusion;  Surgeon: Newt Minion, MD;  Location: [place];  Service: Orthopedics;  Laterality: Right;  . ANKLE FUSION Right 04/18/2016   Right Ankle Tibiocalcaneal Fusion; Removal of deep retained hardware; application of wound vac/notes 04/18/2016  . ANKLE FUSION Right 04/18/2016   Procedure: Right Ankle Tibiocalcaneal Fusion;  Surgeon: Newt Minion, MD;  Location: [place];  Service: Orthopedics;  Laterality: Right;  . ANTERIOR CERVICAL DECOMP/DISCECTOMY FUSION N/A 09/26/2013   Procedure: ANTERIOR CERVICAL DISCECTOMY FUSION C3-4, plate and screw fixation, allograft bone graft;  Surgeon: Jessy Oto, MD;  Location: [facility];  Service: Orthopedics;  Laterality: N/A;  . BACK SURGERY    . BELOW KNEE LEG AMPUTATION Right 06/14/2016  . CARDIAC CATHETERIZATION  X 1  . CARPAL TUNNEL RELEASE Bilateral   . COLONOSCOPY    . COLONOSCOPY N/A 10/22/2014   Procedure: COLONOSCOPY;  Surgeon: Lafayette Dragon, MD;  Location: [facility] ENDOSCOPY;  Service: Endoscopy;  Laterality: N/A;  . CORONARY ANGIOPLASTY WITH STENT PLACEMENT     "I have 9 stents"  . ESOPHAGOGASTRODUODENOSCOPY N/A 10/19/2014   Procedure: ESOPHAGOGASTRODUODENOSCOPY (EGD);  Surgeon: Jerene Bears, MD;  Location: [facility] ENDOSCOPY;  Service: Endoscopy;  Laterality: N/A;  . FUSION OF TALONAVICULAR JOINT Right 04/15/2014   dr duda  . HERNIA REPAIR     umbilical  . INGUINAL HERNIA REPAIR Right 05/11/2015   Procedure: LAPAROSCOPIC REPAIR RIGHT  INGUINAL HERNIA;  Surgeon: Greer Pickerel, MD;  Location: [place];  Service: General;  Laterality: Right;    . INSERTION OF MESH Right 05/11/2015   Procedure: INSERTION OF MESH;  Surgeon: Greer Pickerel, MD;  Location: [place];  Service: General;  Laterality: Right;  . JOINT REPLACEMENT Bilateral    knees  . KNEE CARTILAGE SURGERY Right X 12   "~ 1/2 open; ~ 1/2 scopes"  . KNEE CARTILAGE SURGERY Left X 3   "3 scopes"  . LEFT HEART CATHETERIZATION WITH CORONARY ANGIOGRAM N/A 02/10/2013   Procedure: LEFT HEART CATHETERIZATION WITH CORONARY ANGIOGRAM;  Surgeon: Burnell Blanks, MD;  Location: [facility] CATH LAB;  Service: Cardiovascular;  Laterality: N/A;  . LUMBAR LAMINECTOMY/DECOMPRESSION MICRODISCECTOMY N/A 01/27/2014   Procedure: CENTRAL LUMBAR LAMINECTOMY L4-5 AND L3-4;  Surgeon: Jessy Oto, MD;  Location: [place];  Service: Orthopedics;  Laterality: N/A;  . ORIF ANKLE FRACTURE Right 09/02/2015   Procedure: OPEN REDUCTION INTERNAL FIXATION (ORIF) ANKLE FRACTURE;  Surgeon:  Newt Minion, MD;  Location: [place];  Service: Orthopedics;  Laterality: Right;  . PERIPHERALLY INSERTED CENTRAL CATHETER INSERTION  09/02/2015  . SHOULDER ARTHROSCOPY W/ ROTATOR CUFF REPAIR Bilateral    "3 on the right; 1 on the left"  . SKIN SPLIT GRAFT Right 10/01/2015   Procedure: RIGHT ANKLE APPLY SKIN GRAFT SPLIT THICKNESS;  Surgeon: Newt Minion, MD;  Location: [place];  Service: Orthopedics;  Laterality: Right;  . TONSILLECTOMY    . TOTAL KNEE ARTHROPLASTY Bilateral 2008  . UMBILICAL HERNIA REPAIR     UHR  . URETHRAL DILATION  X 4  . VASECTOMY      reports that he quit smoking about 7 years ago. His smoking use included cigars. He has never used smokeless tobacco. He reports that he does not drink alcohol or use drugs. family history includes Breast cancer in his mother; Coronary artery disease in his other; Depression in his brother, mother, and other; Diabetes in his father; Early death in his maternal grandfather and paternal grandfather; Healthy in his son and son; Heart attack (age of onset: 77) in his maternal  grandfather; Heart disease in his father, maternal grandfather, and mother; Hyperlipidemia in his father; Hypertension in his brother, father, mother, and other; Prostate cancer in his father; Skin cancer in his father. No Known Allergies Current Outpatient Medications on File Prior to Visit  Medication Sig Dispense Refill  . acetaminophen-codeine (TYLENOL #4) 300-60 MG tablet TAKE ONE TABLET BY MOUTH EVERY HOUR AS NEEDED MODERATE FOR PAIN 40 tablet 0  . ARIPiprazole (ABILIFY) 5 MG tablet Take 1 tablet (5 mg total) by mouth daily. Take one tablet at bedtime 90 tablet 0  . aspirin 81 MG tablet Take 81 mg by mouth daily.    [name] atorvastatin (LIPITOR) 20 MG tablet Take 1 tablet (20 mg total) daily by mouth. 90 tablet 2  . benztropine (COGENTIN) 0.5 MG tablet Take 1 tablet (0.5 mg total) by mouth at bedtime. 90 tablet 0  . clonazePAM (KLONOPIN) 0.5 MG tablet Take 1 tablet (0.5 mg total) by mouth at bedtime. 30 tablet 5  . cyclobenzaprine (FLEXERIL) 5 MG tablet take 1 TABLET BY MOUTH THREE TIMES DAILY AS NEEDED for muscle spasms] 90 tablet 3  . DULoxetine (CYMBALTA) 20 MG capsule Take one capsule daily for week and than twice daily 180 capsule 0  . mirabegron ER (MYRBETRIQ) 25 MG TB24 tablet Take 25 mg by mouth daily.    . Omega-3 Fatty Acids (FISH OIL) 1000 MG CAPS Take 1,000 mg by mouth daily.     . pantoprazole (PROTONIX) 40 MG tablet Take 1 TABLET BY MOUTH EVERY DAY 30 tablet 3  . prasugrel (EFFIENT) 5 MG TABS tablet TAKE ONE TABLET BY MOUTH EVERY DAY 90 tablet 3  . pregabalin (LYRICA) 75 MG capsule Take 1 capsule (75 mg total) by mouth 2 (two) times daily. 60 capsule 3  . tamsulosin (FLOMAX) 0.4 MG CAPS capsule Take 0.4 mg by mouth daily after breakfast.     . traMADol (ULTRAM) 50 MG tablet Take 1 tablet (50 mg total) by mouth every 6 (six) hours as needed for severe pain. 30 tablet 3  . traZODone (DESYREL) 150 MG tablet Take 1 tablet (150 mg total) by mouth at bedtime. 90 tablet 0  . triamcinolone  (NASACORT AQ) 55 MCG/ACT AERO nasal inhaler Place 2 sprays into the nose daily. 1 Inhaler 12   No current facility-administered medications on file prior to visit.    Review  of Systems  Constitutional: Negative for other unusual diaphoresis or sweats HENT: Negative for ear discharge or swelling Eyes: Negative for other worsening visual disturbances Respiratory: Negative for stridor or other swelling  Gastrointestinal: Negative for worsening distension or other blood Genitourinary: Negative for retention or other urinary change Musculoskeletal: Negative for other MSK pain or swelling Skin: Negative for color change or other new lesions Neurological: Negative for worsening tremors and other numbness  Psychiatric/Behavioral: Negative for worsening agitation or other fatigue All other system neg per pt    Objective:   Physical Exam BP 136/84   Pulse 80   Temp 98.1 F (36.7 C) (Oral)   Ht 6' (1.829 m)   Wt (!) 317 lb (143.8 kg)   SpO2 96%   BMI 42.99 kg/m  VS noted, non toxic Constitutional: Pt appears in NAD HENT: Head: NCAT.  Right Ear: External ear normal.  Left Ear: External ear normal.  Eyes: . Pupils are equal, round, and reactive to light. Conjunctivae and EOM are normal Nose: without d/c or deformity Neck: Neck supple. Gross normal ROM Cardiovascular: Normal rate and regular rhythm.   Pulmonary/Chest: Effort normal and breath sounds without rales or wheezing.  Abd:  Soft, NT, ND, + BS, no organomegaly Spine with marked low lumbar and SI area tenderness without skin change Marked pain elicited on standing, ambulating, lying down, and palpation of the medial right groin without swelling, hernia, skin change Neurological: Pt is alert. At baseline orientation, motor grossly intact Skin: Skin is warm. No rashes, other new lesions, no LE edema Psychiatric: Pt behavior is normal without agitation  No other exam findings     Assessment & Plan:

## 2017-12-21 ENCOUNTER — Ambulatory Visit (INDEPENDENT_AMBULATORY_CARE_PROVIDER_SITE_OTHER): Payer: Medicare HMO | Admitting: Specialist

## 2017-12-21 ENCOUNTER — Encounter (INDEPENDENT_AMBULATORY_CARE_PROVIDER_SITE_OTHER): Payer: Self-pay | Admitting: Specialist

## 2017-12-21 ENCOUNTER — Other Ambulatory Visit: Payer: Self-pay | Admitting: Internal Medicine

## 2017-12-21 VITALS — BP 145/68 | HR 71 | Ht 72.0 in | Wt 304.0 lb

## 2017-12-21 DIAGNOSIS — R6889 Other general symptoms and signs: Secondary | ICD-10-CM | POA: Diagnosis not present

## 2017-12-21 DIAGNOSIS — M1611 Unilateral primary osteoarthritis, right hip: Secondary | ICD-10-CM | POA: Diagnosis not present

## 2017-12-21 NOTE — Telephone Encounter (Signed)
I have never met Mr. Fye and cannot provide guidance at this time.  We will address preop clearance and discontinuation of prasugrel at upcoming appointment later this month.  Nelva Bush, MD [facility] Pager: [phone]

## 2017-12-21 NOTE — Telephone Encounter (Signed)
Pre-op clearance modifier added to appointment notes. Per usual protocol, MD will need to route final recs including medical clearance and input on Eff[place] to requesting party at time of appointment. Will CC to Dr. Saunders Revel to make him aware (I think this is actually his first time meeting the patient). Nothing to do at the moment, just to be aware for upcoming appt. Will remove this message from APP pre-op screening pool. Hattie Aguinaldo PA-C

## 2017-12-21 NOTE — Telephone Encounter (Signed)
   Primary Cardiologist: Nelva Bush, MD  Pre-op staff, can you find out how urgent procedure is or when they are looking at scheduling this? Patient has a f/u appointment with Dr. Saunders Revel on 8/15 and it would be preferable to keep this to discuss, given patient's extensive cardiac history.  Charlie Pitter, PA-C 12/21/2017, 11:33 AM

## 2017-12-21 NOTE — Telephone Encounter (Signed)
Spoke with [facility] @ [place] and she said it wasn't any urgency to this procedure and the pt should be ok with waiting until after his appt.  Spoke with the pt to advise him, and he was agreeable with the plan.

## 2017-12-24 ENCOUNTER — Ambulatory Visit: Payer: Medicare HMO | Admitting: Psychology

## 2017-12-27 ENCOUNTER — Other Ambulatory Visit: Payer: Self-pay | Admitting: Internal Medicine

## 2017-12-27 NOTE — Telephone Encounter (Signed)
Done erx 

## 2017-12-28 ENCOUNTER — Ambulatory Visit (INDEPENDENT_AMBULATORY_CARE_PROVIDER_SITE_OTHER): Payer: Medicare HMO | Admitting: Psychology

## 2017-12-28 DIAGNOSIS — F332 Major depressive disorder, recurrent severe without psychotic features: Secondary | ICD-10-CM | POA: Diagnosis not present

## 2017-12-28 DIAGNOSIS — Z7902 Long term (current) use of antithrombotics/antiplatelets: Secondary | ICD-10-CM | POA: Diagnosis not present

## 2017-12-28 DIAGNOSIS — E119 Type 2 diabetes mellitus without complications: Secondary | ICD-10-CM | POA: Diagnosis not present

## 2017-12-28 DIAGNOSIS — M479 Spondylosis, unspecified: Secondary | ICD-10-CM | POA: Diagnosis not present

## 2017-12-28 DIAGNOSIS — I2510 Atherosclerotic heart disease of native coronary artery without angina pectoris: Secondary | ICD-10-CM | POA: Diagnosis not present

## 2017-12-28 DIAGNOSIS — Z89619 Acquired absence of unspecified leg above knee: Secondary | ICD-10-CM | POA: Diagnosis not present

## 2017-12-28 DIAGNOSIS — R6889 Other general symptoms and signs: Secondary | ICD-10-CM | POA: Diagnosis not present

## 2017-12-28 DIAGNOSIS — I10 Essential (primary) hypertension: Secondary | ICD-10-CM | POA: Diagnosis not present

## 2017-12-28 DIAGNOSIS — M160 Bilateral primary osteoarthritis of hip: Secondary | ICD-10-CM | POA: Diagnosis not present

## 2017-12-28 DIAGNOSIS — G4733 Obstructive sleep apnea (adult) (pediatric): Secondary | ICD-10-CM | POA: Diagnosis not present

## 2017-12-31 ENCOUNTER — Telehealth: Payer: Self-pay | Admitting: Internal Medicine

## 2017-12-31 DIAGNOSIS — E119 Type 2 diabetes mellitus without complications: Secondary | ICD-10-CM

## 2017-12-31 NOTE — Addendum Note (Signed)
Addended by: [name] on: 12/31/2017 08:10 PM   Modules accepted: Orders

## 2017-12-31 NOTE — Telephone Encounter (Signed)
Copied from [place] [id number]. Topic: Referral - Request >> Dec 31, 2017  1:05 PM Rutherford Nail, [place] wrote: Reason for CRM: Patient calling and is requesting a dietician referral. Please advise.

## 2017-12-31 NOTE — Telephone Encounter (Signed)
Referral done

## 2018-01-01 ENCOUNTER — Telehealth: Payer: Self-pay | Admitting: Internal Medicine

## 2018-01-01 ENCOUNTER — Telehealth (INDEPENDENT_AMBULATORY_CARE_PROVIDER_SITE_OTHER): Payer: Self-pay | Admitting: Specialist

## 2018-01-01 NOTE — Telephone Encounter (Signed)
Patient called would like to speak with [name], Patient is in a lot of pain would like to discuss his patient care. Patient didn't specify the exact nature of the call.

## 2018-01-01 NOTE — Telephone Encounter (Signed)
Copied from [place] [id number]. Topic: Quick Communication - See Telephone Encounter >> Jan 01, 2018  9:32 AM Bea Graff, NT wrote: CRM for notification. See Telephone encounter for: 01/01/18. Pt states that he would like to know where he was referred to for pain management. He states he would like to speak with either Dr. Jenny Reichmann or his nurse regarding his hip, back, and shoulder pain. Requesting call back

## 2018-01-02 ENCOUNTER — Other Ambulatory Visit (HOSPITAL_COMMUNITY): Payer: Self-pay | Admitting: General Surgery

## 2018-01-03 ENCOUNTER — Encounter: Payer: Self-pay | Admitting: Internal Medicine

## 2018-01-03 ENCOUNTER — Ambulatory Visit (INDEPENDENT_AMBULATORY_CARE_PROVIDER_SITE_OTHER): Payer: Medicare HMO | Admitting: Internal Medicine

## 2018-01-03 VITALS — BP 150/78 | HR 74 | Ht 72.0 in | Wt 327.0 lb

## 2018-01-03 DIAGNOSIS — I10 Essential (primary) hypertension: Secondary | ICD-10-CM

## 2018-01-03 DIAGNOSIS — R6889 Other general symptoms and signs: Secondary | ICD-10-CM | POA: Diagnosis not present

## 2018-01-03 DIAGNOSIS — R0602 Shortness of breath: Secondary | ICD-10-CM

## 2018-01-03 DIAGNOSIS — I2510 Atherosclerotic heart disease of native coronary artery without angina pectoris: Secondary | ICD-10-CM | POA: Diagnosis not present

## 2018-01-03 DIAGNOSIS — I509 Heart failure, unspecified: Secondary | ICD-10-CM | POA: Diagnosis not present

## 2018-01-03 DIAGNOSIS — R609 Edema, unspecified: Secondary | ICD-10-CM | POA: Diagnosis not present

## 2018-01-03 DIAGNOSIS — Z01810 Encounter for preprocedural cardiovascular examination: Secondary | ICD-10-CM | POA: Diagnosis not present

## 2018-01-03 MED ORDER — HYDROCHLOROTHIAZIDE 25 MG PO TABS: 25.0000 mg | ORAL_TABLET | Freq: Every day | ORAL | 3 refills | Status: DC

## 2018-01-03 MED ORDER — ATORVASTATIN CALCIUM 20 MG PO TABS: 20.0000 mg | ORAL_TABLET | Freq: Every day | ORAL | 3 refills | Status: DC

## 2018-01-03 NOTE — Telephone Encounter (Signed)
It looks like he would like a call from you to discuss his pain.  I haven't sent his referral yet. I'll need to look further into his chart. He's seen others in the past.

## 2018-01-03 NOTE — Progress Notes (Signed)
Follow-up Outpatient Visit Date: 01/03/2018  Primary Care Provider: [name], MD [address] [place] [address]  Chief Complaint: Shortness of breath  HPI:  Travis [name] is a 67 y.o. year-old male with history of coronary artery disease status post multiple PCI's), hypertension, hyperlipidemia, depression/bipolar disorder, chronic pain, and fibromyalgia who presents for follow-up of coronary artery disease as well as preoperative evaluation in anticipation of bariatric surgery.  He was previously followed in our office by Dr. Aundra Dubin, having last been seen by Robbie Lis, [facility], in 09/2017.  He has been maintained on aspirin and prasugrel 5 mg daily due to his multiple stents and history of poor platelet inhibition with clopidogrel.  Today, Travis [name] is most concerned about back and hip pain.  He is planning to undergo right hip injections and lumbar spine myelogram in the near future.  He is also considering bariatric surgery next year.  His mobility is limited due to his orthopedic issues as well as right BKA.  He has not had any chest pain, palpitations, lightheadedness, orthopnea, or PND.  He also denies lower extremity edema though notes that he has put on about 30 pounds in the last 3 months.  He attributes this to poor diet and minimal activity.  He notes some increasing exertional dyspnea as well over the last few months.  He remains compliant with his medications including dual antiplatelet therapy with aspirin and prasugrel.  However, he stopped taking prasugrel 2 days ago in anticipation of hip injections.  --------------------------------------------------------------------------------------------------  Past Medical/Surgical History:  1. CORONARY ARTERY DISEASE (ICD-414.00): Has had multiple PCIs, reports he never had an MI (just angina). 12/08 had Taxus DES to mLAD, Endeavor DES to [facility] and to dCFX. 11/09 had BMS x 2 to mid RCA. 1/09 had Xience DES to dCFX, mCFX, and  pCFX. Lexiscan myoview (3/11): EF 50%, apical hypokinesis, possible small inferoapical infarct with no ischemia. Low risk.  LHC (6/13): nonobstructive disease.  LHC (9/14): EF 55%, patent stents with nonobstructive disease, EF 55%.  2. HYPERTENSION  3. DEPRESSION  4. URETHRAL STRICTURE  5. NEPHROLITHIASIS  6. ALLERGIC RHINITIS 7. DEGENERATIVE JOINT DISEASE: s/p bilateral TKRs  8. OSA: Not using CPAP very often.  9. Hyperlipidemia  10. Obesity  11. Echo (3/11): EF 55-60%, normal wall motion, grade I diastolic dysfunction, normal valves, normal RV.  12. Clopidogrel resistant  13. Rotator cuff repair 4/11  14. Lumbar disk disease with chronic low back pain.  15. glucose intolerance  16. Erectile dysfunction  17. Gastritis  Recent CV Pertinent Labs: Lab Results  Component Value Date   CHOL 112 07/13/2017   HDL 38.10 (L) 07/13/2017   LDLCALC 59 07/13/2017   TRIG 77.0 07/13/2017   CHOLHDL 3 07/13/2017   INR 1.17 06/14/2016   K 4.1 07/13/2017   MG 1.9 05/03/2013   BUN 19 07/13/2017   CREATININE 1.04 07/13/2017    Current Meds  Medication Sig  . acetaminophen-codeine (TYLENOL #4) 300-60 MG tablet TAKE ONE TABLET BY MOUTH EVERY HOUR AS NEEDED MODERATE FOR PAIN  . ARIPiprazole (ABILIFY) 5 MG tablet Take 1 tablet (5 mg total) by mouth daily. Take one tablet at bedtime  . aspirin 81 MG tablet Take 81 mg by mouth daily.  [name] atorvastatin (LIPITOR) 20 MG tablet Take 1 tablet (20 mg total) daily by mouth.  . benztropine (COGENTIN) 0.5 MG tablet Take 1 tablet (0.5 mg total) by mouth at bedtime.  . clonazePAM (KLONOPIN) 0.5 MG tablet  Take 1 tablet (0.5 mg total) by mouth at bedtime.  . cyclobenzaprine (FLEXERIL) 5 MG tablet take 1 TABLET BY MOUTH THREE TIMES DAILY AS NEEDED for muscle spasms]  . DULoxetine (CYMBALTA) 20 MG capsule Take one capsule daily for week and than twice daily  . HYDROcodone-acetaminophen (NORCO) 10-325 MG tablet TAKE ONE TABLET BY MOUTH EVERY SIX HOURS AS NEEDED FOR UP  TO SEVEN DAYS  . IBU 800 MG tablet Take 1 tablet (800 mg total) by mouth every 6 (six) hours as needed.  . Melatonin 5 MG TABS   . mirabegron ER (MYRBETRIQ) 25 MG TB24 tablet Take 25 mg by mouth daily.  . Omega-3 Fatty Acids (FISH OIL) 1000 MG CAPS Take 1,000 mg by mouth daily.   . pantoprazole (PROTONIX) 40 MG tablet TAKE ONE TABLET BY MOUTH EVERY DAY  . prasugrel (EFFIENT) 5 MG TABS tablet TAKE ONE TABLET BY MOUTH EVERY DAY  . pregabalin (LYRICA) 75 MG capsule Take 1 capsule (75 mg total) by mouth 2 (two) times daily.  . tamsulosin (FLOMAX) 0.4 MG CAPS capsule Take 0.4 mg by mouth daily after breakfast.   . traMADol (ULTRAM) 50 MG tablet Take 1 tablet (50 mg total) by mouth every 6 (six) hours as needed for severe pain.  . traZODone (DESYREL) 150 MG tablet Take 1 tablet (150 mg total) by mouth at bedtime.  . triamcinolone (NASACORT AQ) 55 MCG/ACT AERO nasal inhaler Place 2 sprays into the nose daily.    Allergies: Patient has no known allergies.  Social History   Tobacco Use  . Smoking status: Former Smoker    Types: Cigars    Last attempt to quit: 08/28/2010    Years since quitting: 7.3  . Smokeless tobacco: Never Used  . Tobacco comment: 04/18/2016 "smoked 1 cigar/wk when I did smoke"  Substance Use Topics  . Alcohol use: No    Alcohol/week: 0.0 standard drinks    Comment: rarely  . Drug use: No    Family History  Problem Relation Age of Onset  . Depression Mother   . Heart disease Mother   . Hypertension Mother   . Breast cancer Mother   . Diabetes Father   . Heart disease Father        CABG  . Hypertension Father   . Hyperlipidemia Father   . Prostate cancer Father   . Skin cancer Father   . Depression Brother        x 2  . Hypertension Brother        x2  . Healthy Son   . Heart disease Maternal Grandfather   . Early death Maternal Grandfather   . Heart attack Maternal Grandfather 08/05/2063  . Early death Paternal Grandfather   . Coronary artery disease Other   .  Hypertension Other   . Depression Other   . Healthy Son     Review of Systems: A 12-system review of systems was performed and was negative except as noted in the HPI.  --------------------------------------------------------------------------------------------------  Physical Exam: BP (!) 150/78   Pulse 74   Ht 6' (1.829 m)   Wt (!) 327 lb (148.3 kg)   SpO2 96%   BMI 44.35 kg/m   General: Morbidly obese man, seated comfortably in the exam room. HEENT: No conjunctival pallor or scleral icterus. Moist mucous membranes.  OP clear. Neck: Supple without lymphadenopathy, thyromegaly, JVD, or HJR, though evaluation is limited by body habitus Lungs: Normal work of breathing. Clear to auscultation bilaterally without wheezes  or crackles. Heart: Distant heart sounds.  Regular rate and rhythm without murmurs, rubs, or gallops.  Unable to assess PMI due to body habitus. Abd: Bowel sounds present. Soft, NT/ND.  Unable to assess HSM due to body habitus. Ext: Right BKA is noted.  There is 1+ left pretibial edema. Skin: Warm and dry.  EKG: Normal sinus rhythm without abnormalities.  Lab Results  Component Value Date   WBC 4.4 03/01/2017   HGB 13.2 03/01/2017   HCT 39.9 03/01/2017   MCV 88.9 03/01/2017   PLT 239.0 03/01/2017    Lab Results  Component Value Date   NA 137 07/13/2017   K 4.1 07/13/2017   CL 102 07/13/2017   CO2 26 07/13/2017   BUN 19 07/13/2017   CREATININE 1.04 07/13/2017   GLUCOSE 104 (H) 07/13/2017   ALT 15 07/13/2017    Lab Results  Component Value Date   CHOL 112 07/13/2017   HDL 38.10 (L) 07/13/2017   LDLCALC 59 07/13/2017   TRIG 77.0 07/13/2017   CHOLHDL 3 07/13/2017    --------------------------------------------------------------------------------------------------  ASSESSMENT AND PLAN: Coronary artery disease without angina No symptoms to suggest worsening coronary insufficiency.  We will continue aggressive secondary prevention, including  indefinite dual antiplatelet therapy with aspirin and prasugrel 5 mg daily.  I think it is reasonable for prasugrel to be temporarily held for invasive procedures.  Shortness of breath and heart failure This is likely multifactorial including deconditioning and morbid obesity.  However, given fairly rapid weight gain (23 pounds in 2 weeks per Epic) and edema on exam, I am concerned for some diastolic heart failure and fluid retention.  We we will obtain a transthoracic echocardiogram for further evaluation and also initiate HCTZ 25 mg daily for improved blood pressure control as well as to provide mild diuresis.  Hypertension Blood pressure poorly controlled today.  I have encouraged sodium restriction.  Currently, the patient is not on any medications for blood pressure control.  We have agreed to start HCTZ 25 mg daily.  I will check a basic metabolic panel today and again in about a month.  Morbid obesity Weight loss encouraged.  I think it is reasonable for Travis [name] to proceed with bariatric surgery evaluation.  He will likely need further preop clearance as his surgery nears.  Preop cardiovascular risk assessment I think it is reasonable for Travis [name] to proceed with myelogram and right hip injections, both of which are low risk from a cardiovascular standpoint.  Prasugrel can be held 7 to 10 days prior to the procedures and restarted at 5 mg daily once it is felt safe to do so.  Aspirin 81 mg daily should be continued in the periprocedural period.  We will need to reassess Travis [name] symptoms closer to scheduled bariatric surgery.  Given his limited mobility, he very well may also need ischemia evaluation, particularly if his shortness of breath persists/worsens or his echo shows evidence of reduced LVEF or with new wall motion abnormality.  Follow-up: Return to clinic in 6 to 8 weeks for follow-up with APP.  Long-term, the patient wishes to follow-up with Dr. Angelena Form, given my  transition to the [facility] office.  Travis Bush, MD 01/03/2018 10:03 AM

## 2018-01-03 NOTE — Patient Instructions (Addendum)
Medication Instructions:  START HCTZ 25 mg daily   -- If you need a refill on your cardiac medications before your next appointment, please call your pharmacy. --  Labwork:TODAY/BMET  1 MONTH/BMET   Testing/Procedures:  Your physician has requested that you have an echocardiogram. Echocardiography is a painless test that uses sound waves to create images of your heart. It provides your doctor with information about the size and shape of your heart and how well your heart's chambers and valves are working. This procedure takes approximately one hour. There are no restrictions for this procedure.   Follow-Up: Your physician wants you to follow-up in: 6-8 WEEKS with APP  Long Term with Dr Angelena Form  Thank you for choosing [facility]!!    Any Other Special Instructions Will Be Listed Below (If Applicable).

## 2018-01-03 NOTE — Telephone Encounter (Signed)
Travis [name], NT 01/03/2018 11:02 AM  Summary: requesting call back    Pt is unsure of who he was speaking to this morning regarding his pain management referral. He is requesting a call back. He states that who he was speaking with was having a hard time finding who he was referred to for pain management.

## 2018-01-03 NOTE — Telephone Encounter (Signed)
Spoke to [place], [facility], she informed me that she will send the referral to [facility] to see if he can be accepted there since he has been to a few different clinics. I have informed pt of the above info and let him know that we will be in touch once we know more. He expressed understanding.

## 2018-01-03 NOTE — Telephone Encounter (Signed)
I tried to call but he was in the drs office and he will call me back later

## 2018-01-03 NOTE — Telephone Encounter (Signed)
Spoke with pt, he stated that he hasn't heard back from anyone regarding his referral. He would like to know the name of the office that will be calling him. Please advise.

## 2018-01-04 ENCOUNTER — Other Ambulatory Visit: Payer: Self-pay | Admitting: Internal Medicine

## 2018-01-04 LAB — BASIC METABOLIC PANEL
BUN / CREAT RATIO: 26 — AB (ref 10–24)
BUN: 22 mg/dL (ref 8–27)
CHLORIDE: 110 mmol/L — AB (ref 96–106)
CO2: 22 mmol/L (ref 20–29)
Calcium: 8.9 mg/dL (ref 8.6–10.2)
Creatinine, Ser: 0.84 mg/dL (ref 0.76–1.27)
GFR calc non Af Amer: 92 mL/min/{1.73_m2} (ref >59)
GFR, EST AFRICAN AMERICAN: 106 mL/min/{1.73_m2} (ref >59)
Glucose: 87 mg/dL (ref 65–99)
POTASSIUM: 4.5 mmol/L (ref 3.5–5.2)
Sodium: 145 mmol/L — ABNORMAL HIGH (ref 134–144)

## 2018-01-07 ENCOUNTER — Telehealth: Payer: Self-pay | Admitting: Internal Medicine

## 2018-01-07 MED ORDER — HYDROCODONE-ACETAMINOPHEN 10-325 MG PO TABS: ORAL_TABLET | ORAL | 0 refills | Status: DC

## 2018-01-07 NOTE — Telephone Encounter (Signed)
Notified pt w/Md response. Pt has appt w/MD 01/10/18 he states will discuss w/provider at that time.Marland KitchenJohny [name]

## 2018-01-07 NOTE — Telephone Encounter (Signed)
ASSESSMENT AND PLAN: Coronary artery disease without angina No symptoms to suggest worsening coronary insufficiency.  We will continue aggressive secondary prevention, including indefinite dual antiplatelet therapy with aspirin and prasugrel 5 mg daily.  I think it is reasonable for prasugrel to be temporarily held for invasive procedures.  Shortness of breath and heart failure This is likely multifactorial including deconditioning and morbid obesity.  However, given fairly rapid weight gain (23 pounds in 2 weeks per Epic) and edema on exam, I am concerned for some diastolic heart failure and fluid retention.  We we will obtain a transthoracic echocardiogram for further evaluation and also initiate HCTZ 25 mg daily for improved blood pressure control as well as to provide mild diuresis.  Hypertension Blood pressure poorly controlled today.  I have encouraged sodium restriction.  Currently, the patient is not on any medications for blood pressure control.  We have agreed to start HCTZ 25 mg daily.  I will check a basic metabolic panel today and again in about a month.  Morbid obesity Weight loss encouraged.  I think it is reasonable for Mr. Interrante to proceed with bariatric surgery evaluation.  He will likely need further preop clearance as his surgery nears.  Preop cardiovascular risk assessment I think it is reasonable for Mr. Kerby to proceed with myelogram and right hip injections, both of which are low risk from a cardiovascular standpoint.  Prasugrel can be held 7 to 10 days prior to the procedures and restarted at 5 mg daily once it is felt safe to do so.  Aspirin 81 mg daily should be continued in the periprocedural period.  We will need to reassess Mr. Dauria symptoms closer to scheduled bariatric surgery.  Given his limited mobility, he very well may also need ischemia evaluation, particularly if his shortness of breath persists/worsens or his echo shows evidence of reduced LVEF  or with new wall motion abnormality.  Follow-up: Return to clinic in 6 to 8 weeks for follow-up with APP.  Long-term, the patient wishes to follow-up with Dr. Angelena Form, given my transition to the [facility] office.  Nelva Bush, MD 01/03/2018 10:03 AM

## 2018-01-07 NOTE — Telephone Encounter (Signed)
Copied from [place] [id number]. Topic: Quick Communication - Rx Refill/Question >> Jan 07, 2018  9:22 AM Oliver Pila B wrote: Medication: HYDROcodone-acetaminophen ([place]) 10-325 MG tablet [[id number]]   Has the patient contacted their pharmacy? Yes.   (Agent: If no, request that the patient contact the pharmacy for the refill.) (Agent: If yes, when and what did the pharmacy advise?)  Preferred Pharmacy (with phone number or street name): [facility]  Agent: Please be advised that RX refills may take up to 3 business days. We ask that you follow-up with your pharmacy.

## 2018-01-07 NOTE — Telephone Encounter (Signed)
Hi Travis [name], once the pre-op team establishes the patient needs a provider to clear them in a follow-up appointment, we no longer have a role in the chart. It is up to the physician and their nurse to route the note to the requesting party just like the previous way of doing things (otherwise the pre-op box would be overwhelmed with chasing 10-20 loose ends per day).  Jeanann Lewandowsky says she's already done so but just wanted to make you aware. Thanks. Siah Kannan PA-C

## 2018-01-07 NOTE — Telephone Encounter (Signed)
Done erx  Please let pt know that we would not be able to continue pain medication for more than a few months as I do not practice chronic pain management  If his hip treatment and myelogram does not lead to improvement, he will need referral to pain management

## 2018-01-08 NOTE — Progress Notes (Signed)
Office Visit Note   Patient: Travis Burbano Sr.           Date of Birth: 1950-05-29           MRN: [id number] Visit Date: 12/21/2017              Requested by: [name], MD [address], [place] [id number] PCP: [name], MD   Assessment & Plan: Visit Diagnoses:  1. Arthritis of right hip     Plan: With patient's right hip pain will schedule diagnostic/therapeutic intra-articular Marcaine/Depo-Medrol injection to see if that is the primary source of his pain.  Follow-up with Dr. Louanne Skye in couple weeks for recheck.  If he does get good relief we will refer him to Dr. Jean Rosenthal to see if he is a candidate for total hip replacement.  Regards to ongoing problems with his lumbar spine he will also discuss whether or not surgical intervention is indicated for his back.  All questions answered.  Follow-Up Instructions: Return in about 2 weeks (around 01/04/2018) for With Dr. Louanne Skye to discuss surgery lumbar spine and to recheck right hip after injection.   Orders:  Orders Placed This Encounter  Procedures  . DL FLUORO GUIDED NEEDLE PLC ASPIRATION / INJECTTION/LOC   No orders of the defined types were placed in this encounter.     Procedures: No procedures performed   Clinical Data: No additional findings.   Subjective: Chief Complaint  Patient presents with  . Spine - Follow-up    HPI Patient comes in with complaints of right hip/groin pain.  This is been progressively getting worse for last several weeks.  He seen his primary care physician Dr. [name] for this and x-rays were ordered.  Hip x-rays December 20, 2017 showed: CLINICAL DATA:  Right hip and groin pain.  No known injury.  EXAM: DG HIP (WITH OR WITHOUT PELVIS) 3-4V BILAT  COMPARISON:  09/10/2017.  FINDINGS: Degenerative changes lumbar spine, both SI joints, and both hips. No acute bony or joint abnormality. No evidence of fracture or dislocation. Dystrophic calcification  noted adjacent to the left greater trochanter.  IMPRESSION: Degenerative changes lumbar spine, both SI joints, and both hips. No acute or focal bony abnormality.    Patient is also had a chronic issues of his lumbar spine with lower extremity radiculopathy. Previous lumbar MRI September 05, 2017 showed:  CLINICAL DATA:  Back pain for 6 months. Status post lumbar spine surgery 2016. LEFT hip pain and weakness.  EXAM: MRI LUMBAR SPINE WITHOUT AND WITH CONTRAST  TECHNIQUE: Multiplanar and multiecho pulse sequences of the lumbar spine were obtained without and with intravenous contrast.  CONTRAST:  36m MULTIHANCE GADOBENATE DIMEGLUMINE 529 MG/ML IV SOLN  COMPARISON:  MRI lumbar spine August 30, 2015  FINDINGS: SEGMENTATION: For the purposes of this report, the last well-formed intervertebral disc is reported as L5-S1.  ALIGNMENT: Maintained lumbar lordosis. Minimal grade 1 L2-3 retrolisthesis without spondylolysis. Broad dextroscoliosis evident on axial sequences.  VERTEBRAE: Vertebral bodies are intact. Severe L5-S1 disc height loss, unchanged. Worsened severe L2-3 and a moderate L4-5 disc height loss with desiccation. Stable mild disc height loss at the remaining levels with desiccation. Multilevel mild and moderate chronic discogenic endplate changes without acute or abnormal bone marrow signal. Mild ventral disc enhancement T11-12 and T12-L1 associated mild disc edema.  CONUS MEDULLARIS AND CAUDA EQUINA: Conus medullaris terminates at T12-L1 and demonstrates normal morphology and signal characteristics. Cauda equina is normal. No  abnormal cord, leptomeningeal or epidural enhancement.  PARASPINAL AND OTHER SOFT TISSUES: Nonacute. Moderate to severe lower paraspinal muscle atrophy slightly progressed from prior imaging.  DISC LEVELS:  T12-L1: No disc bulge, canal stenosis nor neural foraminal narrowing.  L1-2: Moderate broad-based disc bulge,  superimposed large RIGHT extraforaminal disc protrusion similar to prior examination which could affect the exited RIGHT L1 nerve. Mild facet arthropathy and ligamentum flavum redundancy. Mild canal stenosis. Mild bilateral caudal neural foraminal narrowing.  L2-3: Retrolisthesis. Endplate spurring, mild facet arthropathy and ligamentum flavum redundancy. Minimal canal stenosis. Minimal neural foraminal narrowing.  L3-4: Annular bulging eccentric laterally. Moderate facet arthropathy. No canal stenosis. Mild bilateral neural foraminal narrowing.  L4-5: Posterior decompression without canal stenosis. Similar to decreased small broad-based disc bulge. Mild facet arthropathy. Moderate bilateral neural foraminal narrowing. Similar encroachment upon the exited RIGHT L4 nerve.  L5-S1: Endplate spurring without disc bulge. Mild facet arthropathy. No canal stenosis. Mild RIGHT and minimal LEFT neural foraminal narrowing.  IMPRESSION: 1. Status post L4-5 laminectomies. 2. Stable minimal L2-3 retrolisthesis without spondylolysis. Progressed spondylosis without fracture or acute osseous process. 3. Mild canal stenosis L1-2, minimal at L2-3. 4. Neural foraminal narrowing L1-2 through L5-S1: Moderate at L4-5   Review of Systems   Objective: Vital Signs: BP (!) 145/68 (BP Location: Left Arm, Patient Position: Sitting)   Pulse 71   Ht 6' (1.829 m)   Wt (!) 304 lb (137.9 kg)   BMI 41.23 kg/m   Physical Exam  Constitutional: He is oriented to person, place, and time. No distress.  HENT:  Head: Normocephalic and atraumatic.  Eyes: Pupils are equal, round, and reactive to light. EOM are normal.  Pulmonary/Chest: No respiratory distress.  Musculoskeletal:  Right hip painful with internal/external rotation.  Lumbar paraspinal tenderness.  Neurological: He is alert and oriented to person, place, and time.    Ortho Exam  Specialty Comments:  No specialty comments  available.  Imaging: No results found.   PMFS History: Patient Active Problem List   Diagnosis Date Noted  . SOB (shortness of breath) 01/03/2018  . Acute on chronic heart failure ([place]) 01/03/2018  . Severe right groin pain 12/20/2017  . Morbid obesity ([place]) 10/09/2017  . Exposure to sexually transmitted disease (STD) 07/14/2017  . Low back pain 07/13/2017  . Leukoplakia, tongue 01/26/2017  . Acute sinus infection 10/20/2016  . Hyperglycemia 10/20/2016  . Skin lesion 10/20/2016  . S/P unilateral BKA (below knee amputation), right ([place]) 06/14/2016  . Charcot foot due to diabetes mellitus ([place])   . Charcot's arthropathy associated with type 2 diabetes mellitus ([place]) 04/11/2016  . Encounter for well adult exam with abnormal findings 11/05/2015  . Major depression 09/13/2015  . S/P TKR (total knee replacement) bilaterally 09/13/2015  . GERD (gastroesophageal reflux disease) 09/08/2015  . S/P laparoscopic hernia repair 05/11/2015  . PPD positive 04/08/2015  . Benign neoplasm of descending colon   . Benign neoplasm of cecum   . AKI (acute kidney injury) ([place]) 10/18/2014  . Acute blood loss anemia   . Chronic anticoagulation   . Occult blood positive stool 10/17/2014  . General weakness 07/14/2014  . Urinary incontinence 07/14/2014  . Headache(784.0) 10/15/2013  . Spinal stenosis in cervical region 09/26/2013    Class: Chronic  . Spinal stenosis, lumbar region, with neurogenic claudication 09/26/2013    Class: Chronic  . Hand joint pain 06/10/2013  . Rotator cuff tear arthropathy of both shoulders 06/10/2013  . Skin lesion of cheek 05/01/2013  . Pain of  right thumb 04/03/2013  . Balance disorder 03/12/2013  . Gait disorder 03/12/2013  . Tremor 03/12/2013  . Left hip pain 03/12/2013  . Pre-ulcerative corn or callous 02/06/2013  . Anxiety 11/12/2011  . OSA (obstructive sleep apnea) 11/07/2011  . Bradycardia 10/20/2011  . Insomnia 10/04/2011  . Obesity 01/12/2011  .  ERECTILE DYSFUNCTION, ORGANIC 05/30/2010  . [place] DISEASE, LUMBAR 04/19/2010  . SCIATICA, LEFT 04/19/2010  . Chronic pain syndrome 10/27/2009  . Hyperlipidemia 07/15/2009  . Essential hypertension 06/24/2009  . Coronary artery disease involving native coronary artery of native heart without angina pectoris 06/24/2009  . Allergic rhinitis 06/24/2009  . URETHRAL STRICTURE 06/24/2009  . DEGENERATIVE JOINT DISEASE 06/24/2009  . SHOULDER PAIN, BILATERAL 06/24/2009  . FATIGUE 06/24/2009  . NEPHROLITHIASIS, HX OF 06/24/2009   Past Medical History:  Diagnosis Date  . ALLERGIC RHINITIS 06/24/2009  . Allergic rhinitis 06/24/2009   Qualifier: Diagnosis of  By: [name], [name]   . Anxiety 11/12/2011   Adequate for discharge   . Arthritis    "all my joints" (09/30/2013)  . Arthritis of foot, right, degenerative 04/15/2014  . Balance disorder 03/12/2013  . Benign neoplasm of cecum   . Benign neoplasm of descending colon   . CAD (coronary artery disease) 06/24/2009   5 stents placed in 2007    . Chronic anticoagulation   . Chronic pain syndrome 10/27/2009   of ankle, shoulders, low back.  sciatica.   . Closed fracture of right foot 10/17/2014  . CORONARY ARTERY DISEASE 06/24/2009   a. s/p multiple PCIs - In 2008 he had a Taxus DES to the mild LAD, Endeavor DES to mid LCX and distal LCX. In January 2009 he had DES to distal LCX, mid LCX and proximal LCX. In November 2009 had BMS x 2 to the mid RCA. Cath 10/2011 with patent stents, noncardiac CP. LHC 01/2013: patent stents (noncardiac CP).  . DEGENERATIVE JOINT DISEASE 06/24/2009   Qualifier: Diagnosis of  By: [name], [name]   . Depression   . Depression with anxiety    Prior suicide attempt  . [place] DISEASE, LUMBAR 04/19/2010  . ERECTILE DYSFUNCTION, ORGANIC 05/30/2010  . Essential hypertension 06/24/2009   Qualifier: Diagnosis of  By: [name], [name]   . Fibromyalgia   . Fracture dislocation of ankle joint 09/02/2015  . Gait disorder 03/12/2013  .  General weakness 07/14/2014  . GERD (gastroesophageal reflux disease) 09/08/2015  . Hand joint pain 06/10/2013  . Heart murmur   . Hepatitis C   . History of kidney stones   . Hyperlipidemia 07/15/2009   Qualifier: Diagnosis of  By: Aundra Dubin, MD, [place]    . HYPERLIPIDEMIA-MIXED 07/15/2009  . HYPERTENSION 06/24/2009  . Insomnia 10/04/2011  . Irregular heart beat   . Left hip pain 03/12/2013   Injected under ultrasound guidance on June 24, 2013   . Major depression 09/13/2015  . Myocardial infarction ([place]) 2008  . Non-cardiac chest pain 10/2011, 01/2013  . Obesity   . Occult blood positive stool 10/17/2014  . Open ankle fracture 09/02/2015  . OSA (obstructive sleep apnea)    not using CPAP (09/30/2013)  . Pain of right thumb 04/03/2013  . Pneumonia   . PPD positive 04/08/2015  . Pre-ulcerative corn or callous 02/06/2013  . Restless legs   . Rotator cuff tear arthropathy of both shoulders 06/10/2013   History of bilateral shoulder cuff surgery for rotator cuff tears. Reports increase in pain 09/11/2015 during physical therapy of  the left shoulder.   [name] SCIATICA, LEFT 04/19/2010   Qualifier: Diagnosis of  By: [name], [name]   . Spinal stenosis in cervical region 09/26/2013  . Spinal stenosis, lumbar region, with neurogenic claudication 09/26/2013  . Type II diabetes mellitus ([place]) 2010-08-14   no meds in 09/2014.   [name] Uncontrolled type 2 DM with peripheral circulatory disorder ([place]) 10/04/2013  . URETHRAL STRICTURE 06/24/2009   self catheterizes.     Family History  Problem Relation Age of Onset  . Depression Mother   . Heart disease Mother   . Hypertension Mother   . Breast cancer Mother   . Diabetes Father   . Heart disease Father        CABG  . Hypertension Father   . Hyperlipidemia Father   . Prostate cancer Father   . Skin cancer Father   . Depression Brother        x 2  . Hypertension Brother        x2  . Healthy Son   . Heart disease Maternal Grandfather   . Early death Maternal  Grandfather   . Heart attack Maternal Grandfather 08-15-2063  . Early death Paternal Grandfather   . Coronary artery disease Other   . Hypertension Other   . Depression Other   . Healthy Son     Past Surgical History:  Procedure Laterality Date  . AMPUTATION Right 06/14/2016   Procedure: AMPUTATION BELOW KNEE;  Surgeon: Newt Minion, MD;  Location: [facility];  Service: Orthopedics;  Laterality: Right;  . ANKLE FUSION Right 04/15/2014   Procedure: Right Subtalar, Talonavicular Fusion;  Surgeon: Newt Minion, MD;  Location: [facility];  Service: Orthopedics;  Laterality: Right;  . ANKLE FUSION Right 04/18/2016   Right Ankle Tibiocalcaneal Fusion; Removal of deep retained hardware; application of wound vac/notes 04/18/2016  . ANKLE FUSION Right 04/18/2016   Procedure: Right Ankle Tibiocalcaneal Fusion;  Surgeon: Newt Minion, MD;  Location: [facility];  Service: Orthopedics;  Laterality: Right;  . ANTERIOR CERVICAL DECOMP/DISCECTOMY FUSION N/A 09/26/2013   Procedure: ANTERIOR CERVICAL DISCECTOMY FUSION C3-4, plate and screw fixation, allograft bone graft;  Surgeon: Jessy Oto, MD;  Location: [facility];  Service: Orthopedics;  Laterality: N/A;  . BACK SURGERY    . BELOW KNEE LEG AMPUTATION Right 06/14/2016  . CARDIAC CATHETERIZATION  X 1  . CARPAL TUNNEL RELEASE Bilateral   . COLONOSCOPY    . COLONOSCOPY N/A 10/22/2014   Procedure: COLONOSCOPY;  Surgeon: Lafayette Dragon, MD;  Location[facility] ENDOSCOPY;  Service: Endoscopy;  Laterality: N/A;  . CORONARY ANGIOPLASTY WITH STENT PLACEMENT     "I have 9 stents"  . ESOPHAGOGASTRODUODENOSCOPY N/A 10/19/2014   Procedure: ESOPHAGOGASTRODUODENOSCOPY (EGD);  Surgeon: Jerene Bears, MD;  Location: [facility] ENDOSCOPY;  Service: Endoscopy;  Laterality: N/A;  . FUSION OF TALONAVICULAR JOINT Right 04/15/2014   dr duda  . HERNIA REPAIR     umbilical  . INGUINAL HERNIA REPAIR Right 05/11/2015   Procedure: LAPAROSCOPIC REPAIR RIGHT  INGUINAL HERNIA;  Surgeon: Greer Pickerel, MD;  Location: [facility];  Service: General;  Laterality: Right;  . INSERTION OF MESH Right 05/11/2015   Procedure: INSERTION OF MESH;  Surgeon: Greer Pickerel, MD;  Location: [facility];  Service: General;  Laterality: Right;  . JOINT REPLACEMENT Bilateral    knees  . KNEE CARTILAGE SURGERY Right X 12   "~ 1/2 open; ~ 1/2 scopes"  . KNEE CARTILAGE SURGERY Left X 3   "  3 scopes"  . LEFT HEART CATHETERIZATION WITH CORONARY ANGIOGRAM N/A 02/10/2013   Procedure: LEFT HEART CATHETERIZATION WITH CORONARY ANGIOGRAM;  Surgeon: Burnell Blanks, MD;  Location[facility] C[facility] LAB;  Service: Cardiovascular;  Laterality: N/A;  . LUMBAR LAMINECTOMY/DECOMPRESSION MICRODISCECTOMY N/A 01/27/2014   Procedure: CENTRAL LUMBAR LAMINECTOMY L4-5 AND L3-4;  Surgeon: Jessy Oto, MD;  Location: [facility];  Service: Orthopedics;  Laterality: N/A;  . ORIF ANKLE FRACTURE Right 09/02/2015   Procedure: OPEN REDUCTION INTERNAL FIXATION (ORIF) ANKLE FRACTURE;  Surgeon: Newt Minion, MD;  Location: [facility];  Service: Orthopedics;  Laterality: Right;  . PERIPHERALLY INSERTED CENTRAL CATHETER INSERTION  09/02/2015  . SHOULDER ARTHROSCOPY W/ ROTATOR CUFF REPAIR Bilateral    "3 on the right; 1 on the left"  . SKIN SPLIT GRAFT Right 10/01/2015   Procedure: RIGHT ANKLE APPLY SKIN GRAFT SPLIT THICKNESS;  Surgeon: Newt Minion, MD;  Location: [facility];  Service: Orthopedics;  Laterality: Right;  . TONSILLECTOMY    . TOTAL KNEE ARTHROPLASTY Bilateral 2008  . UMBILICAL HERNIA REPAIR     UHR  . URETHRAL DILATION  X 4  . VASECTOMY     Social History   Occupational History  . Occupation: disabled since 2006 due to ortho. heart, psych    Employer: UNEMPLOYED  . Occupation: part time work as an Multimedia programmer, wrestling, and Holiday representative  Tobacco Use  . Smoking status: Former Smoker    Types: Cigars    Last attempt to quit: 08/28/2010    Years since quitting: 7.3  . Smokeless tobacco: Never Used  . Tobacco comment: 04/18/2016 "smoked 1  cigar/wk when I did smoke"  Substance and Sexual Activity  . Alcohol use: No    Alcohol/week: 0.0 standard drinks    Comment: rarely  . Drug use: No  . Sexual activity: Not Currently

## 2018-01-08 NOTE — Telephone Encounter (Signed)
Pt never returned the call

## 2018-01-09 ENCOUNTER — Ambulatory Visit (HOSPITAL_COMMUNITY): Payer: Medicare HMO | Attending: Cardiovascular Disease

## 2018-01-09 ENCOUNTER — Other Ambulatory Visit: Payer: Self-pay

## 2018-01-09 DIAGNOSIS — I110 Hypertensive heart disease with heart failure: Secondary | ICD-10-CM | POA: Insufficient documentation

## 2018-01-09 DIAGNOSIS — E785 Hyperlipidemia, unspecified: Secondary | ICD-10-CM | POA: Insufficient documentation

## 2018-01-09 DIAGNOSIS — R0602 Shortness of breath: Secondary | ICD-10-CM | POA: Diagnosis not present

## 2018-01-09 DIAGNOSIS — G4733 Obstructive sleep apnea (adult) (pediatric): Secondary | ICD-10-CM | POA: Insufficient documentation

## 2018-01-09 DIAGNOSIS — I2510 Atherosclerotic heart disease of native coronary artery without angina pectoris: Secondary | ICD-10-CM | POA: Diagnosis not present

## 2018-01-09 DIAGNOSIS — R6889 Other general symptoms and signs: Secondary | ICD-10-CM | POA: Diagnosis not present

## 2018-01-09 DIAGNOSIS — I509 Heart failure, unspecified: Secondary | ICD-10-CM | POA: Diagnosis not present

## 2018-01-09 MED ORDER — PERFLUTREN LIPID MICROSPHERE
1.0000 mL | INTRAVENOUS | Status: AC | PRN
  Administered 2018-01-09: 2 mL via INTRAVENOUS

## 2018-01-10 ENCOUNTER — Other Ambulatory Visit (INDEPENDENT_AMBULATORY_CARE_PROVIDER_SITE_OTHER): Payer: Medicare HMO

## 2018-01-10 ENCOUNTER — Ambulatory Visit (INDEPENDENT_AMBULATORY_CARE_PROVIDER_SITE_OTHER): Payer: Medicare HMO | Admitting: Internal Medicine

## 2018-01-10 ENCOUNTER — Encounter: Payer: Self-pay | Admitting: Internal Medicine

## 2018-01-10 VITALS — BP 160/82 | HR 66 | Temp 98.3°F | Ht 72.0 in | Wt 318.2 lb

## 2018-01-10 DIAGNOSIS — Z0000 Encounter for general adult medical examination without abnormal findings: Secondary | ICD-10-CM

## 2018-01-10 DIAGNOSIS — R739 Hyperglycemia, unspecified: Secondary | ICD-10-CM

## 2018-01-10 DIAGNOSIS — E785 Hyperlipidemia, unspecified: Secondary | ICD-10-CM | POA: Diagnosis not present

## 2018-01-10 DIAGNOSIS — M545 Low back pain, unspecified: Secondary | ICD-10-CM

## 2018-01-10 DIAGNOSIS — R6889 Other general symptoms and signs: Secondary | ICD-10-CM | POA: Diagnosis not present

## 2018-01-10 LAB — URINALYSIS, ROUTINE W REFLEX MICROSCOPIC
Bilirubin Urine: NEGATIVE
Hgb urine dipstick: NEGATIVE
KETONES UR: NEGATIVE
Leukocytes, UA: NEGATIVE
Nitrite: NEGATIVE
PH: 6 (ref 5.0–8.0)
SPECIFIC GRAVITY, URINE: 1.025 (ref 1.000–1.030)
Total Protein, Urine: NEGATIVE
UROBILINOGEN UA: 0.2 (ref 0.0–1.0)
Urine Glucose: NEGATIVE

## 2018-01-10 LAB — CBC WITH DIFFERENTIAL/PLATELET
BASOS ABS: 0.1 10*3/uL (ref 0.0–0.1)
Basophils Relative: 1 % (ref 0.0–3.0)
Eosinophils Absolute: 0.2 10*3/uL (ref 0.0–0.7)
Eosinophils Relative: 3.6 % (ref 0.0–5.0)
HCT: 39.8 % (ref 39.0–52.0)
HEMOGLOBIN: 13.3 g/dL (ref 13.0–17.0)
LYMPHS ABS: 1.6 10*3/uL (ref 0.7–4.0)
LYMPHS PCT: 27.2 % (ref 12.0–46.0)
MCHC: 33.5 g/dL (ref 30.0–36.0)
MCV: 89.8 fl (ref 78.0–100.0)
MONOS PCT: 8.4 % (ref 3.0–12.0)
Monocytes Absolute: 0.5 10*3/uL (ref 0.1–1.0)
NEUTROS PCT: 59.8 % (ref 43.0–77.0)
Neutro Abs: 3.6 10*3/uL (ref 1.4–7.7)
Platelets: 293 10*3/uL (ref 150.0–400.0)
RBC: 4.43 Mil/uL (ref 4.22–5.81)
RDW: 14.4 % (ref 11.5–15.5)
WBC: 6 10*3/uL (ref 4.0–10.5)

## 2018-01-10 LAB — LIPID PANEL
CHOL/HDL RATIO: 2
Cholesterol: 129 mg/dL (ref 0–200)
HDL: 53.3 mg/dL (ref >39.00)
LDL Cholesterol: 58 mg/dL (ref 0–99)
NonHDL: 75.95
TRIGLYCERIDES: 88 mg/dL (ref 0.0–149.0)
VLDL: 17.6 mg/dL (ref 0.0–40.0)

## 2018-01-10 LAB — HEPATIC FUNCTION PANEL
ALBUMIN: 4.6 g/dL (ref 3.5–5.2)
ALK PHOS: 72 U/L (ref 39–117)
ALT: 14 U/L (ref 0–53)
AST: 14 U/L (ref 0–37)
Bilirubin, Direct: 0.1 mg/dL (ref 0.0–0.3)
Total Bilirubin: 0.5 mg/dL (ref 0.2–1.2)
Total Protein: 7.2 g/dL (ref 6.0–8.3)

## 2018-01-10 LAB — BASIC METABOLIC PANEL
BUN: 22 mg/dL (ref 6–23)
CALCIUM: 9.6 mg/dL (ref 8.4–10.5)
CO2: 27 meq/L (ref 19–32)
CREATININE: 0.97 mg/dL (ref 0.40–1.50)
Chloride: 106 mEq/L (ref 96–112)
GFR: 82.4 mL/min (ref >60.00)
Glucose, Bld: 108 mg/dL — ABNORMAL HIGH (ref 70–99)
Potassium: 3.9 mEq/L (ref 3.5–5.1)
Sodium: 141 mEq/L (ref 135–145)

## 2018-01-10 LAB — PSA: PSA: 0.96 ng/mL (ref 0.10–4.00)

## 2018-01-10 LAB — TSH: TSH: 1.91 u[IU]/mL (ref 0.35–4.50)

## 2018-01-10 LAB — HEMOGLOBIN A1C: Hgb A1c MFr Bld: 5.5 % (ref 4.6–6.5)

## 2018-01-10 MED ORDER — KETOROLAC TROMETHAMINE 30 MG/ML IJ SOLN
30.0000 mg | Freq: Once | INTRAMUSCULAR | Status: AC
  Administered 2018-01-10: 30 mg via INTRAMUSCULAR

## 2018-01-10 NOTE — Patient Instructions (Signed)
You had the pain shot (toradol) today  Please continue all other medications as before, and refills have been done if requested.  Please have the pharmacy call with any other refills you may need.  Please continue your efforts at being more active, low cholesterol diet, and weight control.  Please keep your appointments with your specialists as you may have planned  No further lab work needed  Please return in 6 months, or sooner if needed, with Lab testing done 3-5 days before

## 2018-01-10 NOTE — Assessment & Plan Note (Signed)
Lab Results  Component Value Date   HGBA1C 5.5 01/10/2018  stable overall by history and exam, recent data reviewed with pt, and pt to continue medical treatment as before,  to f/u any worsening symptoms or concerns

## 2018-01-10 NOTE — Assessment & Plan Note (Addendum)
chronis persistent, for lumbar surgury hopefully later this yr, really struggling to stand up today, for toradol IM 30

## 2018-01-10 NOTE — Progress Notes (Signed)
Subjective:    Patient ID: Travis Fortis Sr., male    DOB: [birth year], 67 y.o.   MRN: [id number]  HPI  Here for wellness and f/u;  Overall doing ok;  Pt denies Chest pain, worsening SOB, DOE, wheezing, orthopnea, PND, worsening LE edema, palpitations, dizziness or syncope.  Pt denies neurological change such as new headache, facial or extremity weakness.  Pt denies polydipsia, polyuria, or low sugar symptoms. Pt states overall good compliance with treatment and medications, good tolerability, and has been trying to follow appropriate diet.  Pt denies worsening depressive symptoms, suicidal ideation or panic. No fever, night sweats, wt loss, loss of appetite, or other constitutional symptoms.  Pt states good ability with ADL's, has low fall risk, home safety reviewed and adequate, no other significant changes in hearing or vision, and not active with exercise due to both right hip and chronic back pain. Wt Readings from Last 3 Encounters:  01/10/18 (!) 318 lb 3.2 oz (144.3 kg)  01/03/18 (!) 327 lb (148.3 kg)  12/21/17 (!) 304 lb (137.9 kg)  Has been accepted to a bariatric program, trying to lose wt. Due for right hip cortisone next wk, then if not better will need surgury, then after that lumbar surgury.   Recent Echo and ECG recent essentially normal, with mild Diast dysfxn.   No other new complaints Past Medical History:  Diagnosis Date  . ALLERGIC RHINITIS 06/24/2009  . Allergic rhinitis 06/24/2009   Qualifier: Diagnosis of  By: [name], [name]   . Anxiety 11/12/2011   Adequate for discharge   . Arthritis    "all my joints" (09/30/2013)  . Arthritis of foot, right, degenerative 04/15/2014  . Balance disorder 03/12/2013  . Benign neoplasm of cecum   . Benign neoplasm of descending colon   . CAD (coronary artery disease) 06/24/2009   5 stents placed in 2007    . Chronic anticoagulation   . Chronic pain syndrome 10/27/2009   of ankle, shoulders, low back.  sciatica.   . Closed fracture of  right foot 10/17/2014  . CORONARY ARTERY DISEASE 06/24/2009   a. s/p multiple PCIs - In 2008 he had a Taxus DES to the mild LAD, Endeavor DES to mid LCX and distal LCX. In January 2009 he had DES to distal LCX, mid LCX and proximal LCX. In November 2009 had BMS x 2 to the mid RCA. Cath 10/2011 with patent stents, noncardiac CP. LHC 01/2013: patent stents (noncardiac CP).  . DEGENERATIVE JOINT DISEASE 06/24/2009   Qualifier: Diagnosis of  By: [name], [name]   . Depression   . Depression with anxiety    Prior suicide attempt  . Ada DISEASE, LUMBAR 04/19/2010  . ERECTILE DYSFUNCTION, ORGANIC 05/30/2010  . Essential hypertension 06/24/2009   Qualifier: Diagnosis of  By: [name], [name]   . Fibromyalgia   . Fracture dislocation of ankle joint 09/02/2015  . Gait disorder 03/12/2013  . General weakness 07/14/2014  . GERD (gastroesophageal reflux disease) 09/08/2015  . Hand joint pain 06/10/2013  . Heart murmur   . Hepatitis C   . History of kidney stones   . Hyperlipidemia 07/15/2009   Qualifier: Diagnosis of  By: Aundra Dubin, MD, [place]    . HYPERLIPIDEMIA-MIXED 07/15/2009  . HYPERTENSION 06/24/2009  . Insomnia 10/04/2011  . Irregular heart beat   . Left hip pain 03/12/2013   Injected under ultrasound guidance on June 24, 2013   . Major depression 09/13/2015  . Myocardial infarction ([place])  2008  . Non-cardiac chest pain 10/2011, 01/2013  . Obesity   . Occult blood positive stool 10/17/2014  . Open ankle fracture 09/02/2015  . OSA (obstructive sleep apnea)    not using CPAP (09/30/2013)  . Pain of right thumb 04/03/2013  . Pneumonia   . PPD positive 04/08/2015  . Pre-ulcerative corn or callous 02/06/2013  . Restless legs   . Rotator cuff tear arthropathy of both shoulders 06/10/2013   History of bilateral shoulder cuff surgery for rotator cuff tears. Reports increase in pain 09/11/2015 during physical therapy of the left shoulder.   [name] SCIATICA, LEFT 04/19/2010   Qualifier: Diagnosis of  By: [name], [name]   . Spinal stenosis in cervical region 09/26/2013  . Spinal stenosis, lumbar region, with neurogenic claudication 09/26/2013  . Type II diabetes mellitus ([place]) 2012   no meds in 09/2014.   [name] Uncontrolled type 2 DM with peripheral circulatory disorder ([place]) 10/04/2013  . URETHRAL STRICTURE 06/24/2009   self catheterizes.    Past Surgical History:  Procedure Laterality Date  . AMPUTATION Right 06/14/2016   Procedure: AMPUTATION BELOW KNEE;  Surgeon: Newt Minion, MD;  Location: [facility];  Service: Orthopedics;  Laterality: Right;  . ANKLE FUSION Right 04/15/2014   Procedure: Right Subtalar, Talonavicular Fusion;  Surgeon: Newt Minion, MD;  Location: [facility];  Service: Orthopedics;  Laterality: Right;  . ANKLE FUSION Right 04/18/2016   Right Ankle Tibiocalcaneal Fusion; Removal of deep retained hardware; application of wound vac/notes 04/18/2016  . ANKLE FUSION Right 04/18/2016   Procedure: Right Ankle Tibiocalcaneal Fusion;  Surgeon: Newt Minion, MD;  Location: [facility];  Service: Orthopedics;  Laterality: Right;  . ANTERIOR CERVICAL DECOMP/DISCECTOMY FUSION N/A 09/26/2013   Procedure: ANTERIOR CERVICAL DISCECTOMY FUSION C3-4, plate and screw fixation, allograft bone graft;  Surgeon: Jessy Oto, MD;  Location: [facility];  Service: Orthopedics;  Laterality: N/A;  . BACK SURGERY    . BELOW KNEE LEG AMPUTATION Right 06/14/2016  . CARDIAC CATHETERIZATION  X 1  . CARPAL TUNNEL RELEASE Bilateral   . COLONOSCOPY    . COLONOSCOPY N/A 10/22/2014   Procedure: COLONOSCOPY;  Surgeon: Lafayette Dragon, MD;  Location[facility] ENDOSCOPY;  Service: Endoscopy;  Laterality: N/A;  . CORONARY ANGIOPLASTY WITH STENT PLACEMENT     "I have 9 stents"  . ESOPHAGOGASTRODUODENOSCOPY N/A 10/19/2014   Procedure: ESOPHAGOGASTRODUODENOSCOPY (EGD);  Surgeon: Jerene Bears, MD;  [facility];  Service: Endoscopy;  Laterality: N/A;  . FUSION OF TALONAVICULAR JOINT Right 04/15/2014   dr duda  . HERNIA REPAIR     umbilical  .  INGUINAL HERNIA REPAIR Right 05/11/2015   Procedure: LAPAROSCOPIC REPAIR RIGHT  INGUINAL HERNIA;  Surgeon: Greer Pickerel, MD;  Location: [facility];  Service: General;  Laterality: Right;  . INSERTION OF MESH Right 05/11/2015   Procedure: INSERTION OF MESH;  Surgeon: Greer Pickerel, MD;  Location: [facility];  Service: General;  Laterality: Right;  . JOINT REPLACEMENT Bilateral    knees  . KNEE CARTILAGE SURGERY Right X 12   "~ 1/2 open; ~ 1/2 scopes"  . KNEE CARTILAGE SURGERY Left X 3   "3 scopes"  . LEFT HEART CATHETERIZATION WITH CORONARY ANGIOGRAM N/A 02/10/2013   Procedure: LEFT HEART CATHETERIZATION WITH CORONARY ANGIOGRAM;  Surgeon: Burnell Blanks, MD;  Location: [facility] CATH LAB;  Service: Cardiovascular;  Laterality: N/A;  . LUMBAR LAMINECTOMY/DECOMPRESSION MICRODISCECTOMY N/A 01/27/2014   Procedure: CENTRAL LUMBAR LAMINECTOMY L4-5 AND L3-4;  Surgeon:  Jessy Oto, MD;  Location: [facility];  Service: Orthopedics;  Laterality: N/A;  . ORIF ANKLE FRACTURE Right 09/02/2015   Procedure: OPEN REDUCTION INTERNAL FIXATION (ORIF) ANKLE FRACTURE;  Surgeon: Newt Minion, MD;  Location: [facility];  Service: Orthopedics;  Laterality: Right;  . PERIPHERALLY INSERTED CENTRAL CATHETER INSERTION  09/02/2015  . SHOULDER ARTHROSCOPY W/ ROTATOR CUFF REPAIR Bilateral    "3 on the right; 1 on the left"  . SKIN SPLIT GRAFT Right 10/01/2015   Procedure: RIGHT ANKLE APPLY SKIN GRAFT SPLIT THICKNESS;  Surgeon: Newt Minion, MD;  Location: [facility];  Service: Orthopedics;  Laterality: Right;  . TONSILLECTOMY    . TOTAL KNEE ARTHROPLASTY Bilateral 2008  . UMBILICAL HERNIA REPAIR     UHR  . URETHRAL DILATION  X 4  . VASECTOMY      reports that he quit smoking about 7 years ago. His smoking use included cigars. He has never used smokeless tobacco. He reports that he does not drink alcohol or use drugs. family history includes Breast cancer in his mother; Coronary artery disease in his other; Depression in his brother, mother, and  other; Diabetes in his father; Early death in his maternal grandfather and paternal grandfather; Healthy in his son and son; Heart attack (age of onset: 56) in his maternal grandfather; Heart disease in his father, maternal grandfather, and mother; Hyperlipidemia in his father; Hypertension in his brother, father, mother, and other; Prostate cancer in his father; Skin cancer in his father. No Known Allergies Current Outpatient Medications on File Prior to Visit  Medication Sig Dispense Refill  . ARIPiprazole (ABILIFY) 5 MG tablet Take 1 tablet (5 mg total) by mouth daily. Take one tablet at bedtime 90 tablet 0  . aspirin 81 MG tablet Take 81 mg by mouth daily.    [name] atorvastatin (LIPITOR) 20 MG tablet Take 1 tablet (20 mg total) by mouth daily. 90 tablet 3  . benztropine (COGENTIN) 0.5 MG tablet Take 1 tablet (0.5 mg total) by mouth at bedtime. 90 tablet 0  . clonazePAM (KLONOPIN) 0.5 MG tablet Take 1 tablet (0.5 mg total) by mouth at bedtime. 30 tablet 5  . cyclobenzaprine (FLEXERIL) 5 MG tablet take 1 TABLET BY MOUTH THREE TIMES DAILY AS NEEDED for muscle spasms] 90 tablet 3  . DULoxetine (CYMBALTA) 20 MG capsule Take one capsule daily for week and than twice daily 180 capsule 0  . hydrochlorothiazide (HYDRODIURIL) 25 MG tablet Take 1 tablet (25 mg total) by mouth daily. 90 tablet 3  . HYDROcodone-acetaminophen (NORCO) 10-325 MG tablet 1 tab by mouth every 6 hours as needed 40 tablet 0  . IBU 800 MG tablet Take 1 tablet (800 mg total) by mouth every 6 (six) hours as needed. 60 tablet 0  . Melatonin 5 MG TABS     . mirabegron ER (MYRBETRIQ) 25 MG TB24 tablet Take 25 mg by mouth daily.    . Omega-3 Fatty Acids (FISH OIL) 1000 MG CAPS Take 1,000 mg by mouth daily.     . pantoprazole (PROTONIX) 40 MG tablet TAKE ONE TABLET BY MOUTH EVERY DAY 30 tablet 3  . prasugrel (EFFIENT) 5 MG TABS tablet TAKE ONE TABLET BY MOUTH EVERY DAY 90 tablet 3  . pregabalin (LYRICA) 75 MG capsule Take 1 capsule (75 mg  total) by mouth 2 (two) times daily. 60 capsule 3  . tamsulosin (FLOMAX) 0.4 MG CAPS capsule Take 0.4 mg by mouth daily after breakfast.     .  traZODone (DESYREL) 150 MG tablet Take 1 tablet (150 mg total) by mouth at bedtime. 90 tablet 0  . acetaminophen-codeine (TYLENOL #4) 300-60 MG tablet TAKE ONE TABLET BY MOUTH EVERY HOUR AS NEEDED MODERATE FOR PAIN (Patient not taking: Reported on 01/10/2018) 40 tablet 0  . traMADol (ULTRAM) 50 MG tablet Take 1 tablet (50 mg total) by mouth every 6 (six) hours as needed for severe pain. (Patient not taking: Reported on 01/10/2018) 30 tablet 3  . triamcinolone (NASACORT AQ) 55 MCG/ACT AERO nasal inhaler Place 2 sprays into the nose daily. (Patient not taking: Reported on 01/10/2018) 1 Inhaler 12   No current facility-administered medications on file prior to visit.    Review of Systems Constitutional: Negative for other unusual diaphoresis, sweats, appetite or weight changes HENT: Negative for other worsening hearing loss, ear pain, facial swelling, mouth sores or neck stiffness.   Eyes: Negative for other worsening pain, redness or other visual disturbance.  Respiratory: Negative for other stridor or swelling Cardiovascular: Negative for other palpitations or other chest pain  Gastrointestinal: Negative for worsening diarrhea or loose stools, blood in stool, distention or other pain Genitourinary: Negative for hematuria, flank pain or other change in urine volume.  Musculoskeletal: Negative for myalgias or other joint swelling.  Skin: Negative for other color change, or other wound or worsening drainage.  Neurological: Negative for other syncope or numbness. Hematological: Negative for other adenopathy or swelling Psychiatric/Behavioral: Negative for hallucinations, other worsening agitation, SI, self-injury, or new decreased concentration All other system neg per pt    Objective:   Physical Exam BP (!) 160/82 (BP Location: Left Arm, Patient Position:  Sitting, Cuff Size: Normal)   Pulse 66   Temp 98.3 F (36.8 C) (Oral)   Ht 6' (1.829 m)   Wt (!) 318 lb 3.2 oz (144.3 kg)   SpO2 96%   BMI 43.16 kg/m  VS noted, morbid obese Constitutional: Pt is oriented to person, place, and time. Appears well-developed and well-nourished, in no significant distress and comfortable Head: Normocephalic and atraumatic  Eyes: Conjunctivae and EOM are normal. Pupils are equal, round, and reactive to light Right Ear: External ear normal without discharge Left Ear: External ear normal without discharge Nose: Nose without discharge or deformity Mouth/Throat: Oropharynx is without other ulcerations and moist  Neck: Normal range of motion. Neck supple. No JVD present. No tracheal deviation present or significant neck LA or mass Cardiovascular: Normal rate, regular rhythm, normal heart sounds and intact distal pulses.   Pulmonary/Chest: WOB normal and breath sounds without rales or wheezing  Abdominal: Soft. Bowel sounds are normal. NT. No HSM  Musculoskeletal: Normal range of motion. Exhibits no edema to LLE; RLE s/p partial amputation with prosthesis Lymphadenopathy: Has no other cervical adenopathy.  Neurological: Pt is alert and oriented to person, place, and time. Pt has normal reflexes. No cranial nerve deficit. Motor grossly intact, Gait intact Skin: Skin is warm and dry. No rash noted or new ulcerations Psychiatric:  Has nervous depressed mood and affect. Behavior is normal without agitation No other exam findings Lab Results  Component Value Date   WBC 6.0 01/10/2018   HGB 13.3 01/10/2018   HCT 39.8 01/10/2018   PLT 293.0 01/10/2018   GLUCOSE 108 (H) 01/10/2018   CHOL 129 01/10/2018   TRIG 88.0 01/10/2018   HDL 53.30 01/10/2018   LDLCALC 58 01/10/2018   ALT 14 01/10/2018   AST 14 01/10/2018   NA 141 01/10/2018   K 3.9 01/10/2018  CL 106 01/10/2018   CREATININE 0.97 01/10/2018   BUN 22 01/10/2018   CO2 27 01/10/2018   TSH 1.91  01/10/2018   PSA 0.96 01/10/2018   INR 1.17 06/14/2016   HGBA1C 5.5 01/10/2018   MICROALBUR 12.4 (H) 11/10/2015       Assessment & Plan:

## 2018-01-10 NOTE — Assessment & Plan Note (Signed)

## 2018-01-11 ENCOUNTER — Telehealth (INDEPENDENT_AMBULATORY_CARE_PROVIDER_SITE_OTHER): Payer: Self-pay | Admitting: Specialist

## 2018-01-11 NOTE — Telephone Encounter (Signed)
Can you add patient to cancellation list? He also uses transportation so he will need to know something 3 days in advance if something opens. Patients # [phone]

## 2018-01-14 ENCOUNTER — Other Ambulatory Visit (INDEPENDENT_AMBULATORY_CARE_PROVIDER_SITE_OTHER): Payer: Self-pay | Admitting: Specialist

## 2018-01-14 ENCOUNTER — Ambulatory Visit (HOSPITAL_COMMUNITY)
Admission: RE | Admit: 2018-01-14 | Discharge: 2018-01-14 | Disposition: A | Discharge status: Home / Self Care | Payer: Medicare HMO | Source: Ambulatory Visit | Attending: General Surgery | Admitting: General Surgery

## 2018-01-14 ENCOUNTER — Ambulatory Visit
Admission: RE | Admit: 2018-01-14 | Discharge: 2018-01-14 | Disposition: A | Discharge status: Home / Self Care | Payer: Medicare HMO | Source: Ambulatory Visit | Attending: Surgery | Admitting: Surgery

## 2018-01-14 DIAGNOSIS — M1611 Unilateral primary osteoarthritis, right hip: Secondary | ICD-10-CM

## 2018-01-14 DIAGNOSIS — R6889 Other general symptoms and signs: Secondary | ICD-10-CM | POA: Diagnosis not present

## 2018-01-14 DIAGNOSIS — M25551 Pain in right hip: Secondary | ICD-10-CM | POA: Diagnosis not present

## 2018-01-14 DIAGNOSIS — Z01818 Encounter for other preprocedural examination: Secondary | ICD-10-CM | POA: Diagnosis not present

## 2018-01-14 MED ORDER — IOPAMIDOL (ISOVUE-M 200) INJECTION 41%
1.0000 mL | Freq: Once | INTRAMUSCULAR | Status: AC
  Administered 2018-01-14: 1 mL via INTRA_ARTICULAR

## 2018-01-14 MED ORDER — METHYLPREDNISOLONE ACETATE 40 MG/ML INJ SUSP (RADIOLOG
120.0000 mg | Freq: Once | INTRAMUSCULAR | Status: AC
  Administered 2018-01-14: 120 mg via INTRA_ARTICULAR

## 2018-01-14 NOTE — Discharge Instructions (Signed)
Post Procedure Spinal Discharge Instruction Sheet  1. You may resume a regular diet and any medications that you routinely take (including pain medications).  2. No driving day of procedure.  3. Light activity throughout the rest of the day.  Do not do any strenuous work, exercise, bending or lifting.  The day following the procedure, you can resume normal physical activity but you should refrain from exercising or physical therapy for at least three days thereafter.   Common Side Effects:   Headaches- take your usual medications as directed by your physician.  Increase your fluid intake.  Caffeinated beverages may be helpful.  Lie flat in bed until your headache resolves.   Restlessness or inability to sleep- you may have trouble sleeping for the next few days.  Ask your referring physician if you need any medication for sleep.   Facial flushing or redness- should subside within a few days.   Increased pain- a temporary increase in pain a day or two following your procedure is not unusual.  Take your pain medication as prescribed by your referring physician.   Leg cramps  Please contact our office at [phone] for the following symptoms:  Fever greater than 100 degrees.  Headaches unresolved with medication after 2-3 days.  Increased swelling, pain, or redness at injection site.   YOU MAY RESTART YOUR EFFIENT TODAY.

## 2018-01-14 NOTE — Telephone Encounter (Signed)
I put him on the cancellation list

## 2018-01-15 ENCOUNTER — Ambulatory Visit (INDEPENDENT_AMBULATORY_CARE_PROVIDER_SITE_OTHER): Payer: Medicare HMO | Admitting: Psychology

## 2018-01-15 DIAGNOSIS — F332 Major depressive disorder, recurrent severe without psychotic features: Secondary | ICD-10-CM

## 2018-01-15 DIAGNOSIS — R6889 Other general symptoms and signs: Secondary | ICD-10-CM | POA: Diagnosis not present

## 2018-01-15 NOTE — Telephone Encounter (Signed)
lyrica refill request

## 2018-01-16 ENCOUNTER — Other Ambulatory Visit: Payer: Self-pay | Admitting: Internal Medicine

## 2018-01-16 ENCOUNTER — Ambulatory Visit
Admission: RE | Admit: 2018-01-16 | Discharge: 2018-01-16 | Disposition: A | Discharge status: Home / Self Care | Payer: Medicare HMO | Source: Ambulatory Visit | Attending: Specialist | Admitting: Specialist

## 2018-01-16 DIAGNOSIS — M51369 Other intervertebral disc degeneration, lumbar region without mention of lumbar back pain or lower extremity pain: Secondary | ICD-10-CM

## 2018-01-16 DIAGNOSIS — M5126 Other intervertebral disc displacement, lumbar region: Secondary | ICD-10-CM | POA: Diagnosis not present

## 2018-01-16 DIAGNOSIS — M4155 Other secondary scoliosis, thoracolumbar region: Secondary | ICD-10-CM

## 2018-01-16 DIAGNOSIS — M4804 Spinal stenosis, thoracic region: Secondary | ICD-10-CM

## 2018-01-16 DIAGNOSIS — M48062 Spinal stenosis, lumbar region with neurogenic claudication: Secondary | ICD-10-CM

## 2018-01-16 DIAGNOSIS — R6889 Other general symptoms and signs: Secondary | ICD-10-CM | POA: Diagnosis not present

## 2018-01-16 DIAGNOSIS — M5136 Other intervertebral disc degeneration, lumbar region: Secondary | ICD-10-CM

## 2018-01-16 DIAGNOSIS — M47814 Spondylosis without myelopathy or radiculopathy, thoracic region: Secondary | ICD-10-CM | POA: Diagnosis not present

## 2018-01-16 MED ORDER — DIAZEPAM 5 MG PO TABS
10.0000 mg | ORAL_TABLET | Freq: Once | ORAL | Status: AC
  Administered 2018-01-16: 10 mg via ORAL

## 2018-01-16 MED ORDER — DIAZEPAM 5 MG PO TABS: 5.0000 mg | ORAL_TABLET | Freq: Once | ORAL | Status: DC

## 2018-01-16 MED ORDER — HYDROCODONE-ACETAMINOPHEN 10-325 MG PO TABS: ORAL_TABLET | ORAL | 0 refills | Status: DC

## 2018-01-16 MED ORDER — IOPAMIDOL (ISOVUE-M 300) INJECTION 61%
10.0000 mL | Freq: Once | INTRAMUSCULAR | Status: AC | PRN
  Administered 2018-01-16: 10 mL via INTRATHECAL

## 2018-01-16 NOTE — Telephone Encounter (Signed)
Copied from [place] [phone]. Topic: Quick Communication - Rx Refill/Question >> Jan 16, 2018  3:36 PM Waylan Rocher, [place] L wrote: Medication: HYDROcodone-acetaminophen (NORCO) 10-325 MG tablet   Has the patient contacted their pharmacy? Yes.   (Agent: If no, request that the patient contact the pharmacy for the refill.) (Agent: If yes, when and what did the pharmacy advise?) Contact PCP  Preferred Pharmacy (with phone number or street name): [place], [place], [address] [place] [address] [address] Phone: [phone] Fax: [phone]  Agent: Please be advised that RX refills may take up to 3 business days. We ask that you follow-up with your pharmacy.

## 2018-01-16 NOTE — Telephone Encounter (Signed)
Done erx 

## 2018-01-16 NOTE — Telephone Encounter (Signed)
Rx refill request: hydrocodone- acetaminophen 10-325 mg   Last filled:  01/07/18  LOV: 01/10/18  PCP: [facility]: verified

## 2018-01-16 NOTE — Discharge Instructions (Signed)
Myelogram Discharge Instructions  1. Go home and rest quietly for the next 24 hours.  It is important to lie flat for the next 24 hours.  Get up only to go to the restroom.  You may lie in the bed or on a couch on your back, your stomach, your left side or your right side.  You may have one pillow under your head.  You may have pillows between your knees while you are on your side or under your knees while you are on your back.  2. DO NOT drive today.  Recline the seat as far back as it will go, while still wearing your seat belt, on the way home.  3. You may get up to go to the bathroom as needed.  You may sit up for 10 minutes to eat.  You may resume your normal diet and medications unless otherwise indicated.  Drink lots of extra fluids today and tomorrow.  4. The incidence of headache, nausea, or vomiting is about 5% (one in 20 patients).  If you develop a headache, lie flat and drink plenty of fluids until the headache goes away.  Caffeinated beverages may be helpful.  If you develop severe nausea and vomiting or a headache that does not go away with flat bed rest, call [phone].  5. You may resume normal activities after your 24 hours of bed rest is over; however, do not exert yourself strongly or do any heavy lifting tomorrow. If when you get up you have a headache when standing, go back to bed and force fluids for another 24 hours.  6. Call your physician for a follow-up appointment.  The results of your myelogram will be sent directly to your physician by the following day.  7. If you have any questions or if complications develop after you arrive home, please call [phone].  Discharge instructions have been explained to the patient.  The patient, or the person responsible for the patient, fully understands these instructions.  YOU MAY RESTART YOUR EFFIENT TODAY. YOU MAY RESTART YOUR ABILIFY, CYMBALTA, TRAMADOL AND TRAZODONE TOMORROW 01/17/2018 AT 10:30AM.

## 2018-01-16 NOTE — Addendum Note (Signed)
Addended by: [name] on: 01/16/2018 05:01 PM   Modules accepted: Orders

## 2018-01-16 NOTE — Addendum Note (Signed)
Addended by: Valli Glance F on: 01/16/2018 03:41 PM   Modules accepted: Orders

## 2018-01-17 ENCOUNTER — Encounter: Payer: Self-pay | Admitting: Skilled Nursing Facility1

## 2018-01-17 ENCOUNTER — Encounter: Payer: Medicare HMO | Attending: General Surgery | Admitting: Skilled Nursing Facility1

## 2018-01-17 DIAGNOSIS — G894 Chronic pain syndrome: Secondary | ICD-10-CM | POA: Insufficient documentation

## 2018-01-17 DIAGNOSIS — E119 Type 2 diabetes mellitus without complications: Secondary | ICD-10-CM | POA: Diagnosis not present

## 2018-01-17 DIAGNOSIS — Z6841 Body Mass Index (BMI) 40.0 and over, adult: Secondary | ICD-10-CM | POA: Diagnosis not present

## 2018-01-17 DIAGNOSIS — I10 Essential (primary) hypertension: Secondary | ICD-10-CM | POA: Insufficient documentation

## 2018-01-17 DIAGNOSIS — Z89511 Acquired absence of right leg below knee: Secondary | ICD-10-CM | POA: Insufficient documentation

## 2018-01-17 DIAGNOSIS — R6889 Other general symptoms and signs: Secondary | ICD-10-CM | POA: Diagnosis not present

## 2018-01-17 DIAGNOSIS — Z955 Presence of coronary angioplasty implant and graft: Secondary | ICD-10-CM | POA: Diagnosis not present

## 2018-01-17 DIAGNOSIS — F319 Bipolar disorder, unspecified: Secondary | ICD-10-CM | POA: Diagnosis not present

## 2018-01-17 DIAGNOSIS — Z79899 Other long term (current) drug therapy: Secondary | ICD-10-CM | POA: Diagnosis not present

## 2018-01-17 DIAGNOSIS — M160 Bilateral primary osteoarthritis of hip: Secondary | ICD-10-CM | POA: Insufficient documentation

## 2018-01-17 DIAGNOSIS — I2510 Atherosclerotic heart disease of native coronary artery without angina pectoris: Secondary | ICD-10-CM | POA: Insufficient documentation

## 2018-01-17 DIAGNOSIS — G4733 Obstructive sleep apnea (adult) (pediatric): Secondary | ICD-10-CM | POA: Insufficient documentation

## 2018-01-17 DIAGNOSIS — Z713 Dietary counseling and surveillance: Secondary | ICD-10-CM | POA: Insufficient documentation

## 2018-01-17 DIAGNOSIS — Z7902 Long term (current) use of antithrombotics/antiplatelets: Secondary | ICD-10-CM | POA: Insufficient documentation

## 2018-01-17 NOTE — Progress Notes (Signed)
Pre-Op Assessment Visit:  Pre-Operative Sleeve Surgery  Medical Nutrition Therapy:  Appt start time: 4:00  End time:  4:45  Patient was seen on 01/17/2018 for Pre-Operative Nutrition Assessment. Assessment and letter of approval faxed to [facility] Program coordinator on 01/17/2018.   Pt came 14 minutes late.  According to pts referral the pt needs 6 SWL with 2 assessments. Pt state he works with a professional for depression talk therapy and medication management. Pt states he has been taking protonix for many years stating he has never had reflux, pt states he will talk to his doctor about stopping it. Pt states he had diabetes when he was 350 pounds but since losing that weight has an A1C of 5.4. Pt states he does have sleep apnea but does not wear a C-PAP. Pt states he wakes every night 1 time to use the restroom. Pt states he struggles with the difference between hunger and appetite so he drinks water to fill his stomach. Pt states he has done physical therapy in the past. Pt admits to food being his comfort. Pt states 12 years ago he was going to have bariatric surgery and went to counseling for overeating for 6 months and states he  "Did not find it helpful: Dietitian asked why and pt states you know what, I do not know why"  To review next time: Pre-op goals and balanced meals  Pt states he will be getting hip and back surgery before bariatric surgery   Pt expectation of surgery: to help but I know it will not do all the work   Pt expectation of Dietitian: none stated   Start weight at [facility]: 317.8 (this weight is with his prosthesis) BMI: 43.10  24 hr Dietary Recall: First Meal: 4 bacon and 3 eggs and 3 pieces of toast Snack:  Second Meal: meals on wheels  Snack 2pm: sandwich or leftover chicken with vegetables  Third Meal: 5 dollar pizza from [employer] or hamburger or hotdog or chicken or fish  Snack 9pm: popcorn Beverages: 4 liters a day of water,  milk, juice, beer  Encouraged to engage in 50 minutes of moderate physical activity including cardiovascular and weight baring weekly  Handouts given during visit include:  . Pre-Op Goals . Bariatric Surgery Protein Shakes During the appointment today the following Pre-Op Goals were reviewed with the patient: . Log everything you eat and drink . Make healthy food choices . Begin to limit portion sizes . Limited concentrated sugars and fried foods . Keep fat/sugar in the single digits per serving on             food labels . Practice CHEWING your food  (aim for 30 chews per bite or until applesauce consistency) . Practice not drinking 15 minutes before, during, and 30 minutes after each meal/snack . Avoid all carbonated beverages  . Avoid/limit caffeinated beverages  . Avoid all sugar-sweetened beverages . Consume 3 meals per day; eat every 3-5 hours . Make a list of non-food related activities . Aim for 64-100 ounces of FLUID daily  . Aim for at least 60-80 grams of PROTEIN daily . Look for a liquid protein source that contain ?15 g protein and ?5 g carbohydrate  (ex: shakes, drinks, shots) . Think about why you did not make any changes after 6 months of therapy . Discover the difference between appetite and hunger using your should I eat sheet . Work on what you gained form the 6 months of  counseling   -Follow diet recommendations listed below   Energy and Macronutrient Recomendations: Calories: 1800 Carbohydrate: 200 Protein: 135 Fat: 50  Demonstrated degree of understanding via:  Teach Back  Teaching Method Utilized:  Visual Auditory Hands on  Barriers to learning/adherence to lifestyle change: emotional eating   Patient to call the Nutrition and Diabetes Education Services to enroll in Pre-Op and Post-Op Nutrition Education when surgery date is scheduled.

## 2018-01-17 NOTE — Telephone Encounter (Signed)
Called to pharm 

## 2018-01-23 ENCOUNTER — Encounter (INDEPENDENT_AMBULATORY_CARE_PROVIDER_SITE_OTHER): Payer: Medicare HMO

## 2018-01-30 ENCOUNTER — Telehealth: Payer: Self-pay | Admitting: Internal Medicine

## 2018-01-30 MED ORDER — HYDROCODONE-ACETAMINOPHEN 10-325 MG PO TABS: ORAL_TABLET | ORAL | 0 refills | Status: DC

## 2018-01-30 NOTE — Telephone Encounter (Signed)
Review for refill on controlled medication: hydrocodone-acetaminophen  10-325 mg Last refill 01/16/18  Dr. Yevonne Aline  01/10/18 [place]  [place]

## 2018-01-30 NOTE — Telephone Encounter (Signed)
Copied from [place] [phone] [id number]. Topic: Quick Communication - Rx Refill/Question >> Jan 30, 2018 10:16 AM Keene Breath wrote: Medication: HYDROcodone-acetaminophen (NORCO) 10-325 MG tablet  Patient called to request a refill for the above medication.  CB# [phone]  Preferred Pharmacy (with phone [place], [place], [place] [phone] (Phone) [phone] (Fax)

## 2018-01-30 NOTE — Telephone Encounter (Signed)
Done erx 

## 2018-01-31 ENCOUNTER — Other Ambulatory Visit: Payer: Medicare HMO

## 2018-01-31 DIAGNOSIS — I509 Heart failure, unspecified: Secondary | ICD-10-CM | POA: Diagnosis not present

## 2018-01-31 DIAGNOSIS — R6889 Other general symptoms and signs: Secondary | ICD-10-CM | POA: Diagnosis not present

## 2018-01-31 DIAGNOSIS — R0602 Shortness of breath: Secondary | ICD-10-CM | POA: Diagnosis not present

## 2018-02-01 LAB — BASIC METABOLIC PANEL
BUN/Creatinine Ratio: 19 (ref 10–24)
BUN: 21 mg/dL (ref 8–27)
CALCIUM: 8.8 mg/dL (ref 8.6–10.2)
CO2: 22 mmol/L (ref 20–29)
CREATININE: 1.1 mg/dL (ref 0.76–1.27)
Chloride: 107 mmol/L — ABNORMAL HIGH (ref 96–106)
GFR, EST AFRICAN AMERICAN: 81 mL/min/{1.73_m2} (ref >59)
GFR, EST NON AFRICAN AMERICAN: 70 mL/min/{1.73_m2} (ref >59)
Glucose: 138 mg/dL — ABNORMAL HIGH (ref 65–99)
POTASSIUM: 4.4 mmol/L (ref 3.5–5.2)
Sodium: 144 mmol/L (ref 134–144)

## 2018-02-04 ENCOUNTER — Telehealth: Payer: Self-pay

## 2018-02-04 ENCOUNTER — Other Ambulatory Visit: Payer: Self-pay | Admitting: Internal Medicine

## 2018-02-04 NOTE — Telephone Encounter (Signed)
Notes recorded by Frederik Schmidt, RN on 02/04/2018 at 9:12 AM EDT Informed patient of results/recommendations. He verbalized understanding. ------

## 2018-02-04 NOTE — Telephone Encounter (Signed)
-----   Message from Nelva Bush, MD sent at 02/04/2018  7:21 AM EDT ----- Please let Travis [name] know that his creatinine has increased slightly.  I wonder if he may be a little dehydrated after addition of HCTZ.  I encouraged him to drink plenty of water to remain well-hydrated.  Otherwise, his labs are normal.  He should continue his current medications and follow-up with [place] as previously arranged.

## 2018-02-06 DIAGNOSIS — M961 Postlaminectomy syndrome, not elsewhere classified: Secondary | ICD-10-CM | POA: Diagnosis not present

## 2018-02-06 DIAGNOSIS — G894 Chronic pain syndrome: Secondary | ICD-10-CM | POA: Diagnosis not present

## 2018-02-06 DIAGNOSIS — R6889 Other general symptoms and signs: Secondary | ICD-10-CM | POA: Diagnosis not present

## 2018-02-07 ENCOUNTER — Ambulatory Visit: Payer: Medicare HMO | Admitting: Psychology

## 2018-02-07 DIAGNOSIS — R6889 Other general symptoms and signs: Secondary | ICD-10-CM | POA: Diagnosis not present

## 2018-02-12 ENCOUNTER — Other Ambulatory Visit (INDEPENDENT_AMBULATORY_CARE_PROVIDER_SITE_OTHER): Payer: Self-pay | Admitting: Specialist

## 2018-02-12 NOTE — Telephone Encounter (Signed)
pregabalin Refill request

## 2018-02-13 ENCOUNTER — Ambulatory Visit (INDEPENDENT_AMBULATORY_CARE_PROVIDER_SITE_OTHER): Payer: Medicare HMO | Admitting: Psychology

## 2018-02-13 DIAGNOSIS — R6889 Other general symptoms and signs: Secondary | ICD-10-CM | POA: Diagnosis not present

## 2018-02-13 DIAGNOSIS — F332 Major depressive disorder, recurrent severe without psychotic features: Secondary | ICD-10-CM

## 2018-02-13 NOTE — Telephone Encounter (Signed)
Called to pharm 

## 2018-02-14 DIAGNOSIS — M79609 Pain in unspecified limb: Secondary | ICD-10-CM | POA: Diagnosis not present

## 2018-02-14 DIAGNOSIS — T8789 Other complications of amputation stump: Secondary | ICD-10-CM | POA: Diagnosis not present

## 2018-02-14 DIAGNOSIS — M961 Postlaminectomy syndrome, not elsewhere classified: Secondary | ICD-10-CM | POA: Diagnosis not present

## 2018-02-14 DIAGNOSIS — R6889 Other general symptoms and signs: Secondary | ICD-10-CM | POA: Diagnosis not present

## 2018-02-14 DIAGNOSIS — M1611 Unilateral primary osteoarthritis, right hip: Secondary | ICD-10-CM | POA: Diagnosis not present

## 2018-02-18 ENCOUNTER — Encounter: Payer: Medicare HMO | Attending: General Surgery | Admitting: Skilled Nursing Facility1

## 2018-02-18 ENCOUNTER — Ambulatory Visit (HOSPITAL_COMMUNITY): Payer: Self-pay | Admitting: Psychiatry

## 2018-02-18 ENCOUNTER — Ambulatory Visit (INDEPENDENT_AMBULATORY_CARE_PROVIDER_SITE_OTHER): Payer: Medicare HMO | Admitting: Psychiatry

## 2018-02-18 ENCOUNTER — Encounter: Payer: Self-pay | Admitting: Skilled Nursing Facility1

## 2018-02-18 ENCOUNTER — Encounter (HOSPITAL_COMMUNITY): Payer: Self-pay | Admitting: Psychiatry

## 2018-02-18 DIAGNOSIS — Z79899 Other long term (current) drug therapy: Secondary | ICD-10-CM | POA: Diagnosis not present

## 2018-02-18 DIAGNOSIS — Z89511 Acquired absence of right leg below knee: Secondary | ICD-10-CM | POA: Insufficient documentation

## 2018-02-18 DIAGNOSIS — Z955 Presence of coronary angioplasty implant and graft: Secondary | ICD-10-CM | POA: Insufficient documentation

## 2018-02-18 DIAGNOSIS — E669 Obesity, unspecified: Secondary | ICD-10-CM

## 2018-02-18 DIAGNOSIS — I10 Essential (primary) hypertension: Secondary | ICD-10-CM | POA: Insufficient documentation

## 2018-02-18 DIAGNOSIS — I2510 Atherosclerotic heart disease of native coronary artery without angina pectoris: Secondary | ICD-10-CM | POA: Diagnosis not present

## 2018-02-18 DIAGNOSIS — Z87891 Personal history of nicotine dependence: Secondary | ICD-10-CM | POA: Diagnosis not present

## 2018-02-18 DIAGNOSIS — F319 Bipolar disorder, unspecified: Secondary | ICD-10-CM | POA: Insufficient documentation

## 2018-02-18 DIAGNOSIS — G4733 Obstructive sleep apnea (adult) (pediatric): Secondary | ICD-10-CM | POA: Insufficient documentation

## 2018-02-18 DIAGNOSIS — R6889 Other general symptoms and signs: Secondary | ICD-10-CM | POA: Diagnosis not present

## 2018-02-18 DIAGNOSIS — Z713 Dietary counseling and surveillance: Secondary | ICD-10-CM | POA: Diagnosis not present

## 2018-02-18 DIAGNOSIS — G894 Chronic pain syndrome: Secondary | ICD-10-CM | POA: Insufficient documentation

## 2018-02-18 DIAGNOSIS — E119 Type 2 diabetes mellitus without complications: Secondary | ICD-10-CM | POA: Insufficient documentation

## 2018-02-18 DIAGNOSIS — F411 Generalized anxiety disorder: Secondary | ICD-10-CM

## 2018-02-18 DIAGNOSIS — Z6841 Body Mass Index (BMI) 40.0 and over, adult: Secondary | ICD-10-CM | POA: Diagnosis not present

## 2018-02-18 DIAGNOSIS — M160 Bilateral primary osteoarthritis of hip: Secondary | ICD-10-CM | POA: Insufficient documentation

## 2018-02-18 DIAGNOSIS — Z7902 Long term (current) use of antithrombotics/antiplatelets: Secondary | ICD-10-CM | POA: Insufficient documentation

## 2018-02-18 MED ORDER — TRAZODONE HCL 150 MG PO TABS: 150.0000 mg | ORAL_TABLET | Freq: Every day | ORAL | 0 refills | Status: DC

## 2018-02-18 MED ORDER — ARIPIPRAZOLE 5 MG PO TABS: 5.0000 mg | ORAL_TABLET | Freq: Every day | ORAL | 0 refills | Status: DC

## 2018-02-18 MED ORDER — DULOXETINE HCL 30 MG PO CPEP: 30.0000 mg | ORAL_CAPSULE | Freq: Two times a day (BID) | ORAL | 2 refills | Status: DC

## 2018-02-18 MED ORDER — BENZTROPINE MESYLATE 0.5 MG PO TABS: 0.5000 mg | ORAL_TABLET | Freq: Every day | ORAL | 0 refills | Status: DC

## 2018-02-18 NOTE — Progress Notes (Signed)
BH MD/PA/NP OP Progress Note  02/18/2018 1:59 PM Travis Fortis Sr.  MRN:  [id number]  Chief Complaint: I have a lot of pain.  Cymbalta help to some extent.  HPI: Travis [name] came for his follow-up appointment.  On his last visit we started him on Cymbalta 20 mg twice a day.  He is seeing some improvement in his anxiety depression and pain but continues to have days when he is in a lot of pain and get depressed.  He is seeing pain specialist at [facility] and prescribed Lyrica 150 mg twice a day.  He has difficulty walking.  Due to lack of walking and ambulation he had gained weight.  He has no tremors, shakes or any EPS.  He also taking Klonopin and Abilify and trazodone.  Patient denies drinking or using any illegal substances.  Denies any suicidal thoughts or homicidal thought.  Denies any mania, psychosis or any hallucination but remain anxious and worried about his health condition.  Visit Diagnosis:    ICD-10-CM   1. Bipolar 1 disorder (HCC) F31.9 traZODone (DESYREL) 150 MG tablet    benztropine (COGENTIN) 0.5 MG tablet    ARIPiprazole (ABILIFY) 5 MG tablet  2. GAD (generalized anxiety disorder) F41.1 DULoxetine (CYMBALTA) 30 MG capsule    Past Psychiatric History: Reviewed Patient has history of bipolar disorder and he seeing psychiatrists since age 45. He saw psychiatrist in [place] and [place]. He has history of severe mood swings,anger issues, hallucination, irritability and poor impulse control and excessive spending. He married 5 times and he has 2 children but patient has no contact with them.He has one psychiatric hospitalization in 2014 when he took overdose on Xanax with alcohol. He had history of 2 suicidal gesture in the past. Patient did intensive outpatient program in the past. He had tried Effexor, Xanax, Klonopin, Ambien,Lunesta, Wellbutrin, Ambien, Remeron and Prozac. He was started on Abilify from this office.   Past Medical History:  Past Medical History:  Diagnosis  Date  . ALLERGIC RHINITIS 06/24/2009  . Allergic rhinitis 06/24/2009   Qualifier: Diagnosis of  By: [name], [name]   . Anxiety 11/12/2011   Adequate for discharge   . Arthritis    "all my joints" (09/30/2013)  . Arthritis of foot, right, degenerative 04/15/2014  . Balance disorder 03/12/2013  . Benign neoplasm of cecum   . Benign neoplasm of descending colon   . CAD (coronary artery disease) 06/24/2009   5 stents placed in 2007    . Chronic anticoagulation   . Chronic pain syndrome 10/27/2009   of ankle, shoulders, low back.  sciatica.   . Closed fracture of right foot 10/17/2014  . CORONARY ARTERY DISEASE 06/24/2009   a. s/p multiple PCIs - In 2008 he had a Taxus DES to the mild LAD, Endeavor DES to mid LCX and distal LCX. In January 2009 he had DES to distal LCX, mid LCX and proximal LCX. In November 2009 had BMS x 2 to the mid RCA. Cath 10/2011 with patent stents, noncardiac CP. LHC 01/2013: patent stents (noncardiac CP).  . DEGENERATIVE JOINT DISEASE 06/24/2009   Qualifier: Diagnosis of  By: [name], [name]   . Depression   . Depression with anxiety    Prior suicide attempt  . [place] DISEASE, LUMBAR 04/19/2010  . ERECTILE DYSFUNCTION, ORGANIC 05/30/2010  . Essential hypertension 06/24/2009   Qualifier: Diagnosis of  By: [name], [name]   . Fibromyalgia   . Fracture dislocation of ankle joint 09/02/2015  .  Gait disorder 03/12/2013  . General weakness 07/14/2014  . GERD (gastroesophageal reflux disease) 09/08/2015  . Hand joint pain 06/10/2013  . Heart murmur   . Hepatitis C   . History of kidney stones   . Hyperlipidemia 07/15/2009   Qualifier: Diagnosis of  By: Aundra Dubin, MD, [place]    . HYPERLIPIDEMIA-MIXED 07/15/2009  . HYPERTENSION 06/24/2009  . Insomnia 10/04/2011  . Irregular heart beat   . Left hip pain 03/12/2013   Injected under ultrasound guidance on June 24, 2013   . Major depression 09/13/2015  . Myocardial infarction ([place]) 2008  . Non-cardiac chest pain 10/2011, 01/2013  . Obesity    . Occult blood positive stool 10/17/2014  . Open ankle fracture 09/02/2015  . OSA (obstructive sleep apnea)    not using CPAP (09/30/2013)  . Pain of right thumb 04/03/2013  . Pneumonia   . PPD positive 04/08/2015  . Pre-ulcerative corn or callous 02/06/2013  . Restless legs   . Rotator cuff tear arthropathy of both shoulders 06/10/2013   History of bilateral shoulder cuff surgery for rotator cuff tears. Reports increase in pain 09/11/2015 during physical therapy of the left shoulder.   [name] SCIATICA, LEFT 04/19/2010   Qualifier: Diagnosis of  By: [name], [name]   . Spinal stenosis in cervical region 09/26/2013  . Spinal stenosis, lumbar region, with neurogenic claudication 09/26/2013  . Type II diabetes mellitus ([place]) 2012   no meds in 09/2014.   [name] Uncontrolled type 2 DM with peripheral circulatory disorder ([place]) 10/04/2013  . URETHRAL STRICTURE 06/24/2009   self catheterizes.     Past Surgical History:  Procedure Laterality Date  . AMPUTATION Right 06/14/2016   Procedure: AMPUTATION BELOW KNEE;  Surgeon: Newt Minion, MD;  Location: [place];  Service: Orthopedics;  Laterality: Right;  . ANKLE FUSION Right 04/15/2014   Procedure: Right Subtalar, Talonavicular Fusion;  Surgeon: Newt Minion, MD;  Location: [place];  Service: Orthopedics;  Laterality: Right;  . ANKLE FUSION Right 04/18/2016   Right Ankle Tibiocalcaneal Fusion; Removal of deep retained hardware; application of wound vac/notes 04/18/2016  . ANKLE FUSION Right 04/18/2016   Procedure: Right Ankle Tibiocalcaneal Fusion;  Surgeon: Newt Minion, MD;  Location: [place];  Service: Orthopedics;  Laterality: Right;  . ANTERIOR CERVICAL DECOMP/DISCECTOMY FUSION N/A 09/26/2013   Procedure: ANTERIOR CERVICAL DISCECTOMY FUSION C3-4, plate and screw fixation, allograft bone graft;  Surgeon: Jessy Oto, MD;  Location: [place];  Service: Orthopedics;  Laterality: N/A;  . BACK SURGERY    . BELOW KNEE LEG AMPUTATION Right 06/14/2016  . CARDIAC  CATHETERIZATION  X 1  . CARPAL TUNNEL RELEASE Bilateral   . COLONOSCOPY    . COLONOSCOPY N/A 10/22/2014   Procedure: COLONOSCOPY;  Surgeon: Lafayette Dragon, MD;  Location: [facility] ENDOSCOPY;  Service: Endoscopy;  Laterality: N/A;  . CORONARY ANGIOPLASTY WITH STENT PLACEMENT     "I have 9 stents"  . ESOPHAGOGASTRODUODENOSCOPY N/A 10/19/2014   Procedure: ESOPHAGOGASTRODUODENOSCOPY (EGD);  Surgeon: Jerene Bears, MD;  Location: [facility] ENDOSCOPY;  Service: Endoscopy;  Laterality: N/A;  . FUSION OF TALONAVICULAR JOINT Right 04/15/2014   dr duda  . HERNIA REPAIR     umbilical  . INGUINAL HERNIA REPAIR Right 05/11/2015   Procedure: LAPAROSCOPIC REPAIR RIGHT  INGUINAL HERNIA;  Surgeon: Greer Pickerel, MD;  Location: [place];  Service: General;  Laterality: Right;  . INSERTION OF MESH Right 05/11/2015   Procedure: INSERTION OF MESH;  Surgeon: Greer Pickerel, MD;  Location: [facility];  Service: General;  Laterality: Right;  . JOINT REPLACEMENT Bilateral    knees  . KNEE CARTILAGE SURGERY Right X 12   "~ 1/2 open; ~ 1/2 scopes"  . KNEE CARTILAGE SURGERY Left X 3   "3 scopes"  . LEFT HEART CATHETERIZATION WITH CORONARY ANGIOGRAM N/A 02/10/2013   Procedure: LEFT HEART CATHETERIZATION WITH CORONARY ANGIOGRAM;  Surgeon: Burnell Blanks, MD;  Location: [facility] CATH LAB;  Service: Cardiovascular;  Laterality: N/A;  . LUMBAR LAMINECTOMY/DECOMPRESSION MICRODISCECTOMY N/A 01/27/2014   Procedure: CENTRAL LUMBAR LAMINECTOMY L4-5 AND L3-4;  Surgeon: Jessy Oto, MD;  Location: [place];  Service: Orthopedics;  Laterality: N/A;  . ORIF ANKLE FRACTURE Right 09/02/2015   Procedure: OPEN REDUCTION INTERNAL FIXATION (ORIF) ANKLE FRACTURE;  Surgeon: Newt Minion, MD;  Location: [place];  Service: Orthopedics;  Laterality: Right;  . PERIPHERALLY INSERTED CENTRAL CATHETER INSERTION  09/02/2015  . SHOULDER ARTHROSCOPY W/ ROTATOR CUFF REPAIR Bilateral    "3 on the right; 1 on the left"  . SKIN SPLIT GRAFT Right 10/01/2015   Procedure: RIGHT ANKLE  APPLY SKIN GRAFT SPLIT THICKNESS;  Surgeon: Newt Minion, MD;  Location: [place];  Service: Orthopedics;  Laterality: Right;  . TONSILLECTOMY    . TOTAL KNEE ARTHROPLASTY Bilateral 2006-08-13  . UMBILICAL HERNIA REPAIR     UHR  . URETHRAL DILATION  X 4  . VASECTOMY      Family Psychiatric History: Reviewed  Family History:  Family History  Problem Relation Age of Onset  . Depression Mother   . Heart disease Mother   . Hypertension Mother   . Breast cancer Mother   . Diabetes Father   . Heart disease Father        CABG  . Hypertension Father   . Hyperlipidemia Father   . Prostate cancer Father   . Skin cancer Father   . Depression Brother        x 2  . Hypertension Brother        x2  . Healthy Son   . Heart disease Maternal Grandfather   . Early death Maternal Grandfather   . Heart attack Maternal Grandfather 14-Aug-2063  . Early death Paternal Grandfather   . Coronary artery disease Other   . Hypertension Other   . Depression Other   . Healthy Son     Social History:  Social History   Socioeconomic History  . Marital status: Divorced    Spouse name: Not on file  . Number of children: 2  . Years of education: Not on file  . Highest education level: Not on file  Occupational History  . Occupation: disabled since Aug 13, 2004 due to ortho. heart, psych    Employer: UNEMPLOYED  . Occupation: part time work as an Multimedia programmer, wrestling, and Holiday representative  Social Needs  . Financial resource strain: Not on file  . Food insecurity:    Worry: Not on file    Inability: Not on file  . Transportation needs:    Medical: Not on file    Non-medical: Not on file  Tobacco Use  . Smoking status: Former Smoker    Types: Cigars    Last attempt to quit: 08/28/2010    Years since quitting: 7.4  . Smokeless tobacco: Never Used  . Tobacco comment: 04/18/2016 "smoked 1 cigar/wk when I did smoke"  Substance and Sexual Activity  . Alcohol use: No    Alcohol/week: 0.0  standard drinks  Comment: rarely  . Drug use: No  . Sexual activity: Not Currently  Lifestyle  . Physical activity:    Days per week: Not on file    Minutes per session: Not on file  . Stress: Not on file  Relationships  . Social connections:    Talks on phone: Not on file    Gets together: Not on file    Attends religious service: Not on file    Active member of club or organization: Not on file    Attends meetings of clubs or organizations: Not on file    Relationship status: Not on file  Other Topics Concern  . Not on file  Social History Narrative   Played semi-pro football, used steroids   Divorced moved here from [place], [place]   Patient states he has been on disability since his knee surgery.      03/05/2013 AHW "[name]" was born and grew up in [place], [place], and moved to [place], [place] at age 42. He has one sister and 3 brothers. He reports that his childhood was rough. His parents are deceased. He graduated from [employer], and attended one year of college. He is currently unemployed and on disability for multiple medical problems. He has been married 5 times. He has 2 sons. He currently lives alone. 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 He denies that he has any social support system. 03/05/2013 AHW          Allergies: No Known Allergies  Metabolic Disorder Labs: Lab Results  Component Value Date   HGBA1C 5.5 01/10/2018   MPG 105 04/18/2016   MPG 114 09/26/2013   No results found for: PROLACTIN Lab Results  Component Value Date   CHOL 129 01/10/2018   TRIG 88.0 01/10/2018   HDL 53.30 01/10/2018   CHOLHDL 2 01/10/2018   VLDL 17.6 01/10/2018   LDLCALC 58 01/10/2018   LDLCALC 59 07/13/2017   Lab Results  Component Value Date   TSH 1.91 01/10/2018   TSH 3.00 03/01/2017    Therapeutic Level Labs: No results found for: LITHIUM Lab Results  Component Value Date   VALPROATE 46.3 (L) 07/14/2014   No  components found for:  CBMZ  Current Medications: Current Outpatient Medications  Medication Sig Dispense Refill  . ARIPiprazole (ABILIFY) 5 MG tablet Take 1 tablet (5 mg total) by mouth daily. Take one tablet at bedtime 90 tablet 0  . aspirin 81 MG tablet Take 81 mg by mouth daily.    [name] atorvastatin (LIPITOR) 20 MG tablet Take 1 tablet (20 mg total) by mouth daily. 90 tablet 3  . benztropine (COGENTIN) 0.5 MG tablet Take 1 tablet (0.5 mg total) by mouth at bedtime. 90 tablet 0  . clonazePAM (KLONOPIN) 0.5 MG tablet Take 1 tablet (0.5 mg total) by mouth at bedtime. 30 tablet 5  . cyclobenzaprine (FLEXERIL) 5 MG tablet TAKE ONE TABLET BY MOUTH THREE TIMES DAILY AS NEEDED muscle spasm 30 tablet 2  . DULoxetine (CYMBALTA) 30 MG capsule Take 1 capsule (30 mg total) by mouth 2 (two) times daily. Take one capsule daily for week and than twice daily 60 capsule 2  . hydrochlorothiazide (HYDRODIURIL) 25 MG tablet Take 1 tablet (25 mg total) by mouth daily. 90 tablet 3  . HYDROcodone-acetaminophen (NORCO) 10-325 MG tablet 1 tab by mouth every 6 hours as needed 60 tablet 0  . IBU 800 MG tablet Take 1 tablet (800 mg total) by mouth  every 6 (six) hours as needed. 60 tablet 0  . Melatonin 5 MG TABS     . mirabegron ER (MYRBETRIQ) 25 MG TB24 tablet Take 25 mg by mouth daily.    . Omega-3 Fatty Acids (FISH OIL) 1000 MG CAPS Take 1,000 mg by mouth daily.     . pantoprazole (PROTONIX) 40 MG tablet TAKE ONE TABLET BY MOUTH EVERY DAY 30 tablet 3  . prasugrel (EFFIENT) 5 MG TABS tablet TAKE ONE TABLET BY MOUTH EVERY DAY 90 tablet 3  . pregabalin (LYRICA) 150 MG capsule 300 mg.    . tamsulosin (FLOMAX) 0.4 MG CAPS capsule Take 0.4 mg by mouth daily after breakfast.     . traMADol (ULTRAM) 50 MG tablet Take 1 tablet (50 mg total) by mouth every 6 (six) hours as needed for severe pain. 30 tablet 3  . traZODone (DESYREL) 150 MG tablet Take 1 tablet (150 mg total) by mouth at bedtime. 90 tablet 0  . triamcinolone  (NASACORT AQ) 55 MCG/ACT AERO nasal inhaler Place 2 sprays into the nose daily. 1 Inhaler 12   No current facility-administered medications for this visit.      Musculoskeletal: Strength & Muscle Tone: decreased Gait & Station: unsteady Patient leans: N/A  Psychiatric Specialty Exam: ROS  Blood pressure (!) 152/78, pulse 73, height 6' (1.829 m), weight (!) 326 lb (147.9 kg), SpO2 94 %.Body mass index is 44.21 kg/m.  General Appearance: Fairly Groomed  Eye Contact:  Fair  Speech:  Clear and Coherent  Volume:  Normal  Mood:  Anxious and Dysphoric  Affect:  Constricted  Thought Process:  Goal Directed  Orientation:  Full (Time, Place, and Person)  Thought Content: Rumination   Suicidal Thoughts:  No  Homicidal Thoughts:  Denies at this time  Memory:  Immediate;   Fair Recent;   Fair Remote;   Fair  Judgement:  Good  Insight:  Good  Psychomotor Activity:  Decreased  Concentration:  Concentration: Fair and Attention Span: Fair  Recall:  [employer] of Knowledge: Good  Language: Good  Akathisia:  No  Handed:  Right  AIMS (if indicated): not done  Assets:  Communication Skills Desire for Improvement Housing  ADL's:  Intact  Cognition: WNL  Sleep:  Fair   Screenings: AUDIT     Admission (Discharged) from OP Visit from 06/25/2012 in [facility]  Alcohol Use Disorder Identification Test Final Score (AUDIT)  1    PHQ2-9     Nutrition from 01/17/2018 in [facility] Office Visit from 12/20/2017 in [place] Visit from 11/05/2015 in [place] Patient Outreach from 09/15/2015 in [place] Visit from [facility]/14/2015 in [facility]  PHQ-2 Total Score  0  1  0  2  0  PHQ-9 Total Score  -  -  -  5  -       Assessment and Plan: Bipolar disorder type I.  Generalized anxiety disorder.  Recommended to  increase Cymbalta 30 mg twice a day to help his residual anxiety and depression.  Continue trazodone 150 mg at bedtime, Cogentin 0.5 mg at bedtime and Abilify 5 mg daily.  He is getting Klonopin from his primary care physician.  Encouraged to keep appointment with Dr. Cheryln Manly for therapy.  Patient had gained weight from the past.  Encourage healthy lifestyle and watch his calorie intake.  Recommended to call  us back if is any question, concern if he feels worsening of the symptoms.  Follow-up in 3 months.   Kathlee Nations, MD 02/18/2018, 1:59 PM

## 2018-02-18 NOTE — Progress Notes (Signed)
Sleeve Assessment:  1st SWL Appointment.   According to pts referral the pt needs 6 SWL with 2 assessments. Pt state he works with a professional for depression talk therapy and medication management.   Pt arrives having gained about 8.5 pounds. Pt does have right below the knee amputation. Pt states he was soothing himself with food for the pain but states lyrica has helped. Pt states he has a goal of 2000 calories. Pt states working with his phycologist has been helpful. Pt states he has been working on hunger verses appetite. Pt states he is waiting on getting partials for teeth. Pt does get meals on wheels. Pt states his son is getting married this month.   Start weight at [facility]: 317.8 (this weight is with his prosthesis) Weight: 326.3 (this weight is with his prosthesis) BMI: 43.10   MEDICATIONS: see list   DIETARY INTAKE:  24 hr Dietary Recall: First Meal: 4 bacon and 3 eggs and 3 pieces of toast Snack:  Second Meal: meals on wheels  Snack 2pm: sandwich or leftover chicken with vegetables  Third Meal: 5 dollar pizza from [employer] or hamburger or hotdog or chicken or fish  Snack 9pm: popcorn Beverages: 4 liters a day of water, milk, juice, beer  Usual physical activity: ADL's due to pain  Diet to Follow: 2000 calories 225 g carbohydrates 150 g protein 56 g fat   Nutritional Diagnosis:  [place]-3.3 Overweight/obesity related to past poor dietary habits and physical inactivity as evidenced by patient w/ planned Sleeve surgery following dietary guidelines for continued weight loss.    Intervention:  Nutrition counseling for upcoming Bariatric Surgery. Goals: -Encouraged to engage in 150 minutes of moderate physical activity including cardiovascular and weight baring weekly -Dilute your juice: avoid sugar beverages -Work on eliminated fried foods and concentrated sugars   Teaching Method Utilized:  [employer] Hands on  Handouts given during visit include:  Pre-op  goals  Barriers to learning/adherence to lifestyle change: emotional eating  Demonstrated degree of understanding via:  Teach Back   Monitoring/Evaluation:  Dietary intake, exercise,and body weight prn.

## 2018-02-21 ENCOUNTER — Encounter (INDEPENDENT_AMBULATORY_CARE_PROVIDER_SITE_OTHER): Payer: Self-pay | Admitting: Specialist

## 2018-02-21 ENCOUNTER — Ambulatory Visit (INDEPENDENT_AMBULATORY_CARE_PROVIDER_SITE_OTHER): Payer: Medicare HMO | Admitting: Specialist

## 2018-02-21 VITALS — BP 149/68 | HR 66 | Ht 72.0 in | Wt 326.0 lb

## 2018-02-21 DIAGNOSIS — Z89511 Acquired absence of right leg below knee: Secondary | ICD-10-CM

## 2018-02-21 DIAGNOSIS — R6889 Other general symptoms and signs: Secondary | ICD-10-CM | POA: Diagnosis not present

## 2018-02-21 DIAGNOSIS — M4726 Other spondylosis with radiculopathy, lumbar region: Secondary | ICD-10-CM

## 2018-02-21 DIAGNOSIS — M1611 Unilateral primary osteoarthritis, right hip: Secondary | ICD-10-CM | POA: Diagnosis not present

## 2018-02-21 NOTE — Progress Notes (Signed)
Office Visit Note   Patient: Travis Ivins Sr.           Date of Birth: 1950/08/10           MRN: [id number] Visit Date: 02/21/2018              Requested by: [name], MD [place], [place] [id number] PCP: [name], MD   Assessment & Plan: Visit Diagnoses:  1. Other spondylosis with radiculopathy, lumbar region     Plan: Avoid bending, stooping and avoid lifting weights greater than 10 lbs. Avoid prolong standing and walking. Avoid frequent bending and stooping  No lifting greater than 10 lbs. May use ice or moist heat for pain. Weight loss is of benefit. Handicap license is approved. Dr. Romona Curls secretary/Assistant will call to arrange for epidural steroid injection  Right Hip is suffering from osteoarthritis, only real proven treatments are Weight loss.  Well padded shoes help. Ice the hip 2-3 times a day 15-20 mins at a time.    Follow-Up Instructions: Return in about 3 weeks (around 03/14/2018) for also appt with Dr.Blackman to consider for right  THR. in 2-3 weeks..   Orders:  Orders Placed This Encounter  Procedures  . Ambulatory referral to Physical Medicine Rehab   No orders of the defined types were placed in this encounter.     Procedures: No procedures performed   Clinical Data: No additional findings.   Subjective: Chief Complaint  Patient presents with  . Lower Back - Follow-up    Here to review Myelograms of Thoracic and Lumbar Spines  . Right Hip - Follow-up    HPI  Review of Systems  Constitutional: Negative.  Negative for activity change, appetite change, chills, diaphoresis, fatigue, fever and unexpected weight change.  HENT: Negative.   Eyes: Negative.   Respiratory: Negative.   Cardiovascular: Negative.   Gastrointestinal: Negative.   Endocrine: Negative.   Genitourinary: Negative.   Musculoskeletal: Negative.   Skin: Negative.   Allergic/Immunologic: Negative.   Neurological: Negative.     Hematological: Negative.   Psychiatric/Behavioral: Negative.      Objective: Vital Signs: BP (!) 149/68 (BP Location: Left Arm, Patient Position: Sitting)   Pulse 66   Ht 6' (1.829 m)   Wt (!) 326 lb (147.9 kg)   BMI 44.21 kg/m   Physical Exam  Constitutional: He is oriented to person, place, and time. He appears well-developed and well-nourished.  HENT:  Head: Normocephalic and atraumatic.  Eyes: Pupils are equal, round, and reactive to light. EOM are normal.  Neck: Normal range of motion. Neck supple.  Pulmonary/Chest: Effort normal and breath sounds normal.  Abdominal: Soft. Bowel sounds are normal.  Neurological: He is alert and oriented to person, place, and time.  Skin: Skin is warm and dry.  Psychiatric: He has a normal mood and affect. His behavior is normal. Judgment and thought content normal.    Left Hip Exam   Tenderness  The patient is experiencing tenderness in the greater trochanter and anterior.  Range of Motion  Abduction: abnormal  Adduction: abnormal  Extension: abnormal  Flexion: abnormal  External rotation: abnormal  Internal rotation: abnormal   Muscle Strength  Abduction: 5/5  Adduction: 5/5  Flexion: 5/5   Tests  FABER: negative Ober: negative  Other  Erythema: absent Pulse: present   Back Exam   Tenderness  The patient is experiencing tenderness in the lumbar.  Range of Motion  Extension: abnormal  Flexion: normal  Lateral bend right: normal  Lateral bend left: normal  Rotation right: normal  Rotation left: normal   Muscle Strength  Right Quadriceps:  5/5  Left Quadriceps:  5/5  Right Hamstrings:  5/5  Left Hamstrings:  5/5   Tests  Straight leg raise right: negative Straight leg raise left: negative  Reflexes  Patellar:  Hyporeflexic normal Achilles:  Hyporeflexic normal Biceps: normal Babinski's sign: normal   Other  Toe walk: normal Heel walk: normal Sensation: normal Gait: normal  Erythema: no back  redness Scars: absent      Specialty Comments:  No specialty comments available.  Imaging: No results found.   PMFS History: Patient Active Problem List   Diagnosis Date Noted  . Spinal stenosis in cervical region 09/26/2013    Priority: High    Class: Chronic  . Spinal stenosis, lumbar region, with neurogenic claudication 09/26/2013    Priority: High    Class: Chronic  . SOB (shortness of breath) 01/03/2018  . Acute on chronic heart failure ([place]) 01/03/2018  . Severe right groin pain 12/20/2017  . Morbid obesity ([place]) 10/09/2017  . Exposure to sexually transmitted disease (STD) 07/14/2017  . Low back pain 07/13/2017  . Leukoplakia, tongue 01/26/2017  . Acute sinus infection 10/20/2016  . Hyperglycemia 10/20/2016  . Skin lesion 10/20/2016  . S/P unilateral BKA (below knee amputation), right ([place]) 06/14/2016  . Charcot foot due to diabetes mellitus ([place])   . Charcot's arthropathy associated with type 2 diabetes mellitus ([place]) 04/11/2016  . Preventative health care 11/05/2015  . Major depression 09/13/2015  . S/P TKR (total knee replacement) bilaterally 09/13/2015  . GERD (gastroesophageal reflux disease) 09/08/2015  . S/P laparoscopic hernia repair 05/11/2015  . PPD positive 04/08/2015  . Benign neoplasm of descending colon   . Benign neoplasm of cecum   . AKI (acute kidney injury) ([place]) 10/18/2014  . Acute blood loss anemia   . Chronic anticoagulation   . Occult blood positive stool 10/17/2014  . General weakness 07/14/2014  . Urinary incontinence 07/14/2014  . Headache(784.0) 10/15/2013  . Hand joint pain 06/10/2013  . Rotator cuff tear arthropathy of both shoulders 06/10/2013  . Skin lesion of cheek 05/01/2013  . Pain of right thumb 04/03/2013  . Balance disorder 03/12/2013  . Gait disorder 03/12/2013  . Tremor 03/12/2013  . Left hip pain 03/12/2013  . Pre-ulcerative corn or callous 02/06/2013  . Anxiety 11/12/2011  . OSA (obstructive sleep apnea)  11/07/2011  . Bradycardia 10/20/2011  . Insomnia 10/04/2011  . Obesity 01/12/2011  . ERECTILE DYSFUNCTION, ORGANIC 05/30/2010  . [place] DISEASE, LUMBAR 04/19/2010  . SCIATICA, LEFT 04/19/2010  . Chronic pain syndrome 10/27/2009  . Hyperlipidemia 07/15/2009  . Essential hypertension 06/24/2009  . Coronary artery disease involving native coronary artery of native heart without angina pectoris 06/24/2009  . Allergic rhinitis 06/24/2009  . URETHRAL STRICTURE 06/24/2009  . DEGENERATIVE JOINT DISEASE 06/24/2009  . SHOULDER PAIN, BILATERAL 06/24/2009  . FATIGUE 06/24/2009  . NEPHROLITHIASIS, HX OF 06/24/2009   Past Medical History:  Diagnosis Date  . ALLERGIC RHINITIS 06/24/2009  . Allergic rhinitis 06/24/2009   Qualifier: Diagnosis of  By: [name], [name]   . Anxiety 11/12/2011   Adequate for discharge   . Arthritis    "all my joints" (09/30/2013)  . Arthritis of foot, right, degenerative 04/15/2014  . Balance disorder 03/12/2013  . Benign neoplasm of cecum   . Benign neoplasm of descending colon   . CAD (coronary artery  disease) 06/24/2009   5 stents placed in July 18, 2005    . Chronic anticoagulation   . Chronic pain syndrome 10/27/2009   of ankle, shoulders, low back.  sciatica.   . Closed fracture of right foot 10/17/2014  . CORONARY ARTERY DISEASE 06/24/2009   a. s/p multiple PCIs - In 07-18-2006 he had a Taxus DES to the mild LAD, Endeavor DES to mid LCX and distal LCX. In January 2009 he had DES to distal LCX, mid LCX and proximal LCX. In November 2009 had BMS x 2 to the mid RCA. Cath 10/2011 with patent stents, noncardiac CP. LHC 01/2013: patent stents (noncardiac CP).  . DEGENERATIVE JOINT DISEASE 06/24/2009   Qualifier: Diagnosis of  By: [name], [name]   . Depression   . Depression with anxiety    Prior suicide attempt  . [place] DISEASE, LUMBAR 04/19/2010  . ERECTILE DYSFUNCTION, ORGANIC 05/30/2010  . Essential hypertension 06/24/2009   Qualifier: Diagnosis of  By: [name], [name]   . Fibromyalgia     . Fracture dislocation of ankle joint 09/02/2015  . Gait disorder 03/12/2013  . General weakness 07/14/2014  . GERD (gastroesophageal reflux disease) 09/08/2015  . Hand joint pain 06/10/2013  . Heart murmur   . Hepatitis C   . History of kidney stones   . Hyperlipidemia 07/15/2009   Qualifier: Diagnosis of  By: Aundra Dubin, MD, [place]    . HYPERLIPIDEMIA-MIXED 07/15/2009  . HYPERTENSION 06/24/2009  . Insomnia 10/04/2011  . Irregular heart beat   . Left hip pain 03/12/2013   Injected under ultrasound guidance on June 24, 2013   . Major depression 09/13/2015  . Myocardial infarction ([place]) 2006-07-18  . Non-cardiac chest pain 10/2011, 01/2013  . Obesity   . Occult blood positive stool 10/17/2014  . Open ankle fracture 09/02/2015  . OSA (obstructive sleep apnea)    not using CPAP (09/30/2013)  . Pain of right thumb 04/03/2013  . Pneumonia   . PPD positive 04/08/2015  . Pre-ulcerative corn or callous 02/06/2013  . Restless legs   . Rotator cuff tear arthropathy of both shoulders 06/10/2013   History of bilateral shoulder cuff surgery for rotator cuff tears. Reports increase in pain 09/11/2015 during physical therapy of the left shoulder.   [name] SCIATICA, LEFT 04/19/2010   Qualifier: Diagnosis of  By: [name], [name]   . Spinal stenosis in cervical region 09/26/2013  . Spinal stenosis, lumbar region, with neurogenic claudication 09/26/2013  . Type II diabetes mellitus ([place]) 2010/07/18   no meds in 09/2014.   [name] Uncontrolled type 2 DM with peripheral circulatory disorder ([place]) 10/04/2013  . URETHRAL STRICTURE 06/24/2009   self catheterizes.     Family History  Problem Relation Age of Onset  . Depression Mother   . Heart disease Mother   . Hypertension Mother   . Breast cancer Mother   . Diabetes Father   . Heart disease Father        CABG  . Hypertension Father   . Hyperlipidemia Father   . Prostate cancer Father   . Skin cancer Father   . Depression Brother        x 2  . Hypertension Brother        x2  .  Healthy Son   . Heart disease Maternal Grandfather   . Early death Maternal Grandfather   . Heart attack Maternal Grandfather 07-19-63  . Early death Paternal Grandfather   . Coronary artery disease Other   .  Hypertension Other   . Depression Other   . Healthy Son     Past Surgical History:  Procedure Laterality Date  . AMPUTATION Right 06/14/2016   Procedure: AMPUTATION BELOW KNEE;  Surgeon: Newt Minion, MD;  Location: [facility];  Service: Orthopedics;  Laterality: Right;  . ANKLE FUSION Right 04/15/2014   Procedure: Right Subtalar, Talonavicular Fusion;  Surgeon: Newt Minion, MD;  Location: [facility];  Service: Orthopedics;  Laterality: Right;  . ANKLE FUSION Right 04/18/2016   Right Ankle Tibiocalcaneal Fusion; Removal of deep retained hardware; application of wound vac/notes 04/18/2016  . ANKLE FUSION Right 04/18/2016   Procedure: Right Ankle Tibiocalcaneal Fusion;  Surgeon: Newt Minion, MD;  Location: [facility];  Service: Orthopedics;  Laterality: Right;  . ANTERIOR CERVICAL DECOMP/DISCECTOMY FUSION N/A 09/26/2013   Procedure: ANTERIOR CERVICAL DISCECTOMY FUSION C3-4, plate and screw fixation, allograft bone graft;  Surgeon: Jessy Oto, MD;  Location: [facility];  Service: Orthopedics;  Laterality: N/A;  . BACK SURGERY    . BELOW KNEE LEG AMPUTATION Right 06/14/2016  . CARDIAC CATHETERIZATION  X 1  . CARPAL TUNNEL RELEASE Bilateral   . COLONOSCOPY    . COLONOSCOPY N/A 10/22/2014   Procedure: COLONOSCOPY;  Surgeon: Lafayette Dragon, MD;  Location: [facility] ENDOSCOPY;  Service: Endoscopy;  Laterality: N/A;  . CORONARY ANGIOPLASTY WITH STENT PLACEMENT     "I have 9 stents"  . ESOPHAGOGASTRODUODENOSCOPY N/A 10/19/2014   Procedure: ESOPHAGOGASTRODUODENOSCOPY (EGD);  Surgeon: Jerene Bears, MD;  [facility] ENDOSCOPY;  Service: Endoscopy;  Laterality: N/A;  . FUSION OF TALONAVICULAR JOINT Right 04/15/2014   dr duda  . HERNIA REPAIR     umbilical  . INGUINAL HERNIA REPAIR Right 05/11/2015   Procedure:  LAPAROSCOPIC REPAIR RIGHT  INGUINAL HERNIA;  Surgeon: Greer Pickerel, MD;  Location: [facility];  Service: General;  Laterality: Right;  . INSERTION OF MESH Right 05/11/2015   Procedure: INSERTION OF MESH;  Surgeon: Greer Pickerel, MD;  Location: [facility];  Service: General;  Laterality: Right;  . JOINT REPLACEMENT Bilateral    knees  . KNEE CARTILAGE SURGERY Right X 12   "~ 1/2 open; ~ 1/2 scopes"  . KNEE CARTILAGE SURGERY Left X 3   "3 scopes"  . LEFT HEART CATHETERIZATION WITH CORONARY ANGIOGRAM N/A 02/10/2013   Procedure: LEFT HEART CATHETERIZATION WITH CORONARY ANGIOGRAM;  Surgeon: Burnell Blanks, MD;  [facility];  Service: Cardiovascular;  Laterality: N/A;  . LUMBAR LAMINECTOMY/DECOMPRESSION MICRODISCECTOMY N/A 01/27/2014   Procedure: CENTRAL LUMBAR LAMINECTOMY L4-5 AND L3-4;  Surgeon: Jessy Oto, MD;  Location: [facility];  Service: Orthopedics;  Laterality: N/A;  . ORIF ANKLE FRACTURE Right 09/02/2015   Procedure: OPEN REDUCTION INTERNAL FIXATION (ORIF) ANKLE FRACTURE;  Surgeon: Newt Minion, MD;  Location: [facility];  Service: Orthopedics;  Laterality: Right;  . PERIPHERALLY INSERTED CENTRAL CATHETER INSERTION  09/02/2015  . SHOULDER ARTHROSCOPY W/ ROTATOR CUFF REPAIR Bilateral    "3 on the right; 1 on the left"  . SKIN SPLIT GRAFT Right 10/01/2015   Procedure: RIGHT ANKLE APPLY SKIN GRAFT SPLIT THICKNESS;  Surgeon: Newt Minion, MD;  Location: [facility];  Service: Orthopedics;  Laterality: Right;  . TONSILLECTOMY    . TOTAL KNEE ARTHROPLASTY Bilateral 2008  . UMBILICAL HERNIA REPAIR     UHR  . URETHRAL DILATION  X 4  . VASECTOMY     Social History   Occupational History  . Occupation: disabled since 2006 due to ortho.  heart, psych    Employer: UNEMPLOYED  . Occupation: part time work as an Multimedia programmer, wrestling, and Holiday representative  Tobacco Use  . Smoking status: Former Smoker    Types: Cigars    Last attempt to quit: 08/28/2010    Years since quitting:  7.4  . Smokeless tobacco: Never Used  . Tobacco comment: 04/18/2016 "smoked 1 cigar/wk when I did smoke"  Substance and Sexual Activity  . Alcohol use: No    Alcohol/week: 0.0 standard drinks    Comment: rarely  . Drug use: No  . Sexual activity: Not Currently

## 2018-02-21 NOTE — Patient Instructions (Signed)
Avoid bending, stooping and avoid lifting weights greater than 10 lbs. Avoid prolong standing and walking. Avoid frequent bending and stooping  No lifting greater than 10 lbs. May use ice or moist heat for pain. Weight loss is of benefit. Handicap license is approved. Dr. Romona Curls secretary/Assistant will call to arrange for epidural steroid injection  Right Hip is suffering from osteoarthritis, only real proven treatments are Weight loss.  Well padded shoes help. Ice the hip 2-3 times a day 15-20 mins at a time.

## 2018-02-25 ENCOUNTER — Encounter: Payer: Self-pay | Admitting: Neurology

## 2018-02-25 ENCOUNTER — Ambulatory Visit (INDEPENDENT_AMBULATORY_CARE_PROVIDER_SITE_OTHER): Payer: Medicare HMO | Admitting: Neurology

## 2018-02-25 VITALS — BP 145/73 | HR 69 | Ht 73.0 in | Wt 336.0 lb

## 2018-02-25 DIAGNOSIS — E11610 Type 2 diabetes mellitus with diabetic neuropathic arthropathy: Secondary | ICD-10-CM | POA: Diagnosis not present

## 2018-02-25 DIAGNOSIS — I2510 Atherosclerotic heart disease of native coronary artery without angina pectoris: Secondary | ICD-10-CM | POA: Diagnosis not present

## 2018-02-25 DIAGNOSIS — I739 Peripheral vascular disease, unspecified: Secondary | ICD-10-CM

## 2018-02-25 DIAGNOSIS — F5105 Insomnia due to other mental disorder: Secondary | ICD-10-CM

## 2018-02-25 DIAGNOSIS — I2129 ST elevation (STEMI) myocardial infarction involving other sites: Secondary | ICD-10-CM

## 2018-02-25 DIAGNOSIS — Z7189 Other specified counseling: Secondary | ICD-10-CM | POA: Diagnosis not present

## 2018-02-25 DIAGNOSIS — M461 Sacroiliitis, not elsewhere classified: Secondary | ICD-10-CM

## 2018-02-25 DIAGNOSIS — F5104 Psychophysiologic insomnia: Secondary | ICD-10-CM

## 2018-02-25 DIAGNOSIS — R6889 Other general symptoms and signs: Secondary | ICD-10-CM | POA: Diagnosis not present

## 2018-02-25 DIAGNOSIS — Z89511 Acquired absence of right leg below knee: Secondary | ICD-10-CM | POA: Diagnosis not present

## 2018-02-25 DIAGNOSIS — I219 Acute myocardial infarction, unspecified: Secondary | ICD-10-CM | POA: Insufficient documentation

## 2018-02-25 DIAGNOSIS — F99 Mental disorder, not otherwise specified: Secondary | ICD-10-CM

## 2018-02-25 NOTE — Patient Instructions (Signed)
Sleep Study  Travis Folmar Sr. will be scheduled for a sleep study at [facility] ( [facility] )  You will arrive at either 8 or 9 PM and leave next morning between 5:00 a.m - 6:00 a.m.  The sleep study consists of a recording of your brain waves (EEG). Breathing, heart rate and rhythm (ECG), oxygen level, eye movement, and leg movement.  The technician will glue or or paste several electrodes to your scalp, face, chest and legs.  You will have belts around your chest and abdomen to record breathing and a finger clasp to check blood oxygen levels.  A tube at your mouth and nose will detect airflow.  There are no needle sticks or painful procedures of any sort.  You will have your own room, and we will make every effort to attend to your comfort and privacy.  Please prepare for your study by the following steps:   Please avoid coffee, tea, soda, chocolate and other caffeine foods or beverages after 12:00 noon on the day of your sleep study.   You must arrive with clean (no oils), conditioners or make up, and please make sure that you wash your hair to ensure that your hair and scalp are clean, dry and free of any hair extensions on the day of your study.  This will help to get a good reading of study.  Please try not to nap on the day of your study.  Please bring a list of all your medications.  Bring any medications that you might need during the time you are within the laboratory, including insulin, sleeping pills, pain medication and anxiety medications.  Bring snacks, water or juice  Please bring clothes to sleep in and your normal overnight bag.  Please leave valuable at home, as we will not be responsible for any lost items.  If you have any further questions, please feel free to call our office. Thank you. Larey Seat, MD
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
## 2018-02-28 ENCOUNTER — Ambulatory Visit (INDEPENDENT_AMBULATORY_CARE_PROVIDER_SITE_OTHER): Payer: Medicare HMO | Admitting: Cardiology

## 2018-02-28 ENCOUNTER — Encounter: Payer: Self-pay | Admitting: Cardiology

## 2018-02-28 VITALS — BP 124/70 | HR 71 | Ht 73.0 in | Wt 331.0 lb

## 2018-02-28 DIAGNOSIS — E785 Hyperlipidemia, unspecified: Secondary | ICD-10-CM | POA: Diagnosis not present

## 2018-02-28 DIAGNOSIS — I10 Essential (primary) hypertension: Secondary | ICD-10-CM

## 2018-02-28 DIAGNOSIS — H52203 Unspecified astigmatism, bilateral: Secondary | ICD-10-CM | POA: Diagnosis not present

## 2018-02-28 DIAGNOSIS — Z6841 Body Mass Index (BMI) 40.0 and over, adult: Secondary | ICD-10-CM | POA: Diagnosis not present

## 2018-02-28 DIAGNOSIS — H524 Presbyopia: Secondary | ICD-10-CM | POA: Diagnosis not present

## 2018-02-28 DIAGNOSIS — R0602 Shortness of breath: Secondary | ICD-10-CM | POA: Diagnosis not present

## 2018-02-28 DIAGNOSIS — R6889 Other general symptoms and signs: Secondary | ICD-10-CM | POA: Diagnosis not present

## 2018-02-28 DIAGNOSIS — G4733 Obstructive sleep apnea (adult) (pediatric): Secondary | ICD-10-CM

## 2018-02-28 DIAGNOSIS — I2510 Atherosclerotic heart disease of native coronary artery without angina pectoris: Secondary | ICD-10-CM

## 2018-02-28 DIAGNOSIS — H2513 Age-related nuclear cataract, bilateral: Secondary | ICD-10-CM | POA: Diagnosis not present

## 2018-02-28 NOTE — Progress Notes (Signed)
Cardiology Office Note:    Date:  02/28/2018   ID:  Travis Fortis Sr., DOB March 27, 1951, MRN [id number]  PCP:  [name], MD  Cardiologist:  Nelva Bush, MD  Referring MD: [name], MD   Chief Complaint  Patient presents with  . Follow-up    History of Present Illness:    Travis Holquin Sr. is a 67 y.o. male with a past medical history significant for chronic pain on chronic narcotics, hypertension, CAD S/P multiple stents (9 stent per pt, last was >10 years ago), hyperlipidemia, depression/bipolar disorder, S/P right BKA for Charcot foot and diabetes, fibromyalgia and obstructive sleep apnea. He was diabetic for about 5 years until his leg amputation and then his A1c has been in the 5 range.   Travis [name] was last seen on 01/03/2018 by Dr. Saunders Revel at which time he was noted to have about a 30 pound weight increase over the prior 3 months.  The patient attributed this to poor diet and inactivity.  He was also having some increasing exertional dyspnea.  Dr. Saunders Revel was concerned for heart failure.  An echo cardiogram showed normal LV systolic function with grade 1 diastolic dysfunction.  It was noted that the patient was considering weight loss surgery, sleeve gastrectomy.  Clearance for this procedure would be based on his current symptoms. Dr End stated that if his shortness of breath was not improved at this visit he may need ischemic evaluation prior to surgery.  The patient has a history of sleep apnea diagnosed about 15 years ago originally treated with CPAP but has not used it in about 10 years after losing some weight and stopped snoring, but this was not sustained.Travis [name]  He was seen by neurology on 02/25/2018 who has ordered a split-night sleep study   Travis [name] is here today for follow up. He is not very active at all due to mobility. He does have DOE with walking up hill or up stairs. He has to exert more effort with any activity due to use of R leg prosthetic. No chest  pain/pressure/tigthness. No orthopnea, PND or edema. Occasionally feels a little bloated in the abdomen. He drinks a gallon of water per day. Advised to decrease that a little to 1/2-3/4 gallon.   He is going through the protocols for weight loss surgery, 6 month process, started 2 months ago. [facility] for surgery in March or April.   He says that he will be having a back surgery and right hip replacement in the next couple of months. He will need clearance for these.   Past Medical History:  Diagnosis Date  . ALLERGIC RHINITIS 06/24/2009  . Allergic rhinitis 06/24/2009   Qualifier: Diagnosis of  By: [name], [name]   . Anxiety 11/12/2011   Adequate for discharge   . Arthritis    "all my joints" (09/30/2013)  . Arthritis of foot, right, degenerative 04/15/2014  . Balance disorder 03/12/2013  . Benign neoplasm of cecum   . Benign neoplasm of descending colon   . CAD (coronary artery disease) 06/24/2009   5 stents placed in 2007    . Chronic anticoagulation   . Chronic pain syndrome 10/27/2009   of ankle, shoulders, low back.  sciatica.   . Closed fracture of right foot 10/17/2014  . CORONARY ARTERY DISEASE 06/24/2009   a. s/p multiple PCIs - In 2008 he had a Taxus DES to the mild LAD, Endeavor DES to mid LCX and distal LCX. In January 2009  he had DES to distal LCX, mid LCX and proximal LCX. In November 2009 had BMS x 2 to the mid RCA. Cath 10/2011 with patent stents, noncardiac CP. LHC 01/2013: patent stents (noncardiac CP).  . DEGENERATIVE JOINT DISEASE 06/24/2009   Qualifier: Diagnosis of  By: [name], [name]   . Depression   . Depression with anxiety    Prior suicide attempt  . [place] DISEASE, LUMBAR 04/19/2010  . ERECTILE DYSFUNCTION, ORGANIC 05/30/2010  . Essential hypertension 06/24/2009   Qualifier: Diagnosis of  By: [name], [name]   . Fibromyalgia   . Fracture dislocation of ankle joint 09/02/2015  . Gait disorder 03/12/2013  . General weakness 07/14/2014  . GERD (gastroesophageal reflux  disease) 09/08/2015  . Hand joint pain 06/10/2013  . Heart murmur   . Hepatitis C   . History of kidney stones   . Hyperlipidemia 07/15/2009   Qualifier: Diagnosis of  By: Aundra Dubin, MD, [place]    . HYPERLIPIDEMIA-MIXED 07/15/2009  . HYPERTENSION 06/24/2009  . Insomnia 10/04/2011  . Irregular heart beat   . Left hip pain 03/12/2013   Injected under ultrasound guidance on June 24, 2013   . Major depression 09/13/2015  . Myocardial infarction ([place]) 2008  . Non-cardiac chest pain 10/2011, 01/2013  . Obesity   . Occult blood positive stool 10/17/2014  . Open ankle fracture 09/02/2015  . OSA (obstructive sleep apnea)    not using CPAP (09/30/2013)  . Pain of right thumb 04/03/2013  . Pneumonia   . PPD positive 04/08/2015  . Pre-ulcerative corn or callous 02/06/2013  . Restless legs   . Rotator cuff tear arthropathy of both shoulders 06/10/2013   History of bilateral shoulder cuff surgery for rotator cuff tears. Reports increase in pain 09/11/2015 during physical therapy of the left shoulder.   Travis [name] SCIATICA, LEFT 04/19/2010   Qualifier: Diagnosis of  By: [name], [name]   . Spinal stenosis in cervical region 09/26/2013  . Spinal stenosis, lumbar region, with neurogenic claudication 09/26/2013  . Type II diabetes mellitus ([place]) 2012   no meds in 09/2014.   Travis [name] Uncontrolled type 2 DM with peripheral circulatory disorder ([place]) 10/04/2013  . URETHRAL STRICTURE 06/24/2009   self catheterizes.     Past Surgical History:  Procedure Laterality Date  . AMPUTATION Right 06/14/2016   Procedure: AMPUTATION BELOW KNEE;  Surgeon: Newt Minion, MD;  Location: [facility];  Service: Orthopedics;  Laterality: Right;  . ANKLE FUSION Right 04/15/2014   Procedure: Right Subtalar, Talonavicular Fusion;  Surgeon: Newt Minion, MD;  Location: [facility];  Service: Orthopedics;  Laterality: Right;  . ANKLE FUSION Right 04/18/2016   Right Ankle Tibiocalcaneal Fusion; Removal of deep retained hardware; application of wound vac/notes  04/18/2016  . ANKLE FUSION Right 04/18/2016   Procedure: Right Ankle Tibiocalcaneal Fusion;  Surgeon: Newt Minion, MD;  Location: [facility];  Service: Orthopedics;  Laterality: Right;  . ANTERIOR CERVICAL DECOMP/DISCECTOMY FUSION N/A 09/26/2013   Procedure: ANTERIOR CERVICAL DISCECTOMY FUSION C3-4, plate and screw fixation, allograft bone graft;  Surgeon: Jessy Oto, MD;  Location: [facility];  Service: Orthopedics;  Laterality: N/A;  . BACK SURGERY    . BELOW KNEE LEG AMPUTATION Right 06/14/2016  . CARDIAC CATHETERIZATION  X 1  . CARPAL TUNNEL RELEASE Bilateral   . COLONOSCOPY    . COLONOSCOPY N/A 10/22/2014   Procedure: COLONOSCOPY;  Surgeon: Lafayette Dragon, MD;  Location: [facility] ENDOSCOPY;  Service: Endoscopy;  Laterality: N/A;  .  CORONARY ANGIOPLASTY WITH STENT PLACEMENT     "I have 9 stents"  . ESOPHAGOGASTRODUODENOSCOPY N/A 10/19/2014   Procedure: ESOPHAGOGASTRODUODENOSCOPY (EGD);  Surgeon: Jerene Bears, MD;  Location: [facility] ENDOSCOPY;  Service: Endoscopy;  Laterality: N/A;  . FUSION OF TALONAVICULAR JOINT Right 04/15/2014   dr duda  . HERNIA REPAIR     umbilical  . INGUINAL HERNIA REPAIR Right 05/11/2015   Procedure: LAPAROSCOPIC REPAIR RIGHT  INGUINAL HERNIA;  Surgeon: Greer Pickerel, MD;  Location: [facility];  Service: General;  Laterality: Right;  . INSERTION OF MESH Right 05/11/2015   Procedure: INSERTION OF MESH;  Surgeon: Greer Pickerel, MD;  Location: [name];  Service: General;  Laterality: Right;  . JOINT REPLACEMENT Bilateral    knees  . KNEE CARTILAGE SURGERY Right X 12   "~ 1/2 open; ~ 1/2 scopes"  . KNEE CARTILAGE SURGERY Left X 3   "3 scopes"  . LEFT HEART CATHETERIZATION WITH CORONARY ANGIOGRAM N/A 02/10/2013   Procedure: LEFT HEART CATHETERIZATION WITH CORONARY ANGIOGRAM;  Surgeon: Burnell Blanks, MD;  Location: [facility] CATH LAB;  Service: Cardiovascular;  Laterality: N/A;  . LUMBAR LAMINECTOMY/DECOMPRESSION MICRODISCECTOMY N/A 01/27/2014   Procedure: CENTRAL LUMBAR LAMINECTOMY L4-5 AND  L3-4;  Surgeon: Jessy Oto, MD;  Location: [facility];  Service: Orthopedics;  Laterality: N/A;  . ORIF ANKLE FRACTURE Right 09/02/2015   Procedure: OPEN REDUCTION INTERNAL FIXATION (ORIF) ANKLE FRACTURE;  Surgeon: Newt Minion, MD;  Location: [facility];  Service: Orthopedics;  Laterality: Right;  . PERIPHERALLY INSERTED CENTRAL CATHETER INSERTION  09/02/2015  . SHOULDER ARTHROSCOPY W/ ROTATOR CUFF REPAIR Bilateral    "3 on the right; 1 on the left"  . SKIN SPLIT GRAFT Right 10/01/2015   Procedure: RIGHT ANKLE APPLY SKIN GRAFT SPLIT THICKNESS;  Surgeon: Newt Minion, MD;  Location: [facility];  Service: Orthopedics;  Laterality: Right;  . TONSILLECTOMY    . TOTAL KNEE ARTHROPLASTY Bilateral 2008  . UMBILICAL HERNIA REPAIR     UHR  . URETHRAL DILATION  X 4  . VASECTOMY      Current Medications: Current Meds  Medication Sig  . ARIPiprazole (ABILIFY) 5 MG tablet Take 1 tablet (5 mg total) by mouth daily. Take one tablet at bedtime  . aspirin 81 MG tablet Take 81 mg by mouth daily.  Travis [name] atorvastatin (LIPITOR) 20 MG tablet Take 1 tablet (20 mg total) by mouth daily.  . benztropine (COGENTIN) 0.5 MG tablet Take 1 tablet (0.5 mg total) by mouth at bedtime.  . clonazePAM (KLONOPIN) 0.5 MG tablet Take 1 tablet (0.5 mg total) by mouth at bedtime.  . cyclobenzaprine (FLEXERIL) 5 MG tablet TAKE ONE TABLET BY MOUTH THREE TIMES DAILY AS NEEDED muscle spasm  . DULoxetine (CYMBALTA) 30 MG capsule Take 1 capsule (30 mg total) by mouth 2 (two) times daily. Take one capsule daily for week and than twice daily  . hydrochlorothiazide (HYDRODIURIL) 25 MG tablet Take 1 tablet (25 mg total) by mouth daily.  Travis [name] HYDROcodone-acetaminophen (NORCO) 10-325 MG tablet 1 tab by mouth every 6 hours as needed  . Melatonin 5 MG TABS   . mirabegron ER (MYRBETRIQ) 25 MG TB24 tablet Take 25 mg by mouth daily.  . Omega-3 Fatty Acids (FISH OIL) 1000 MG CAPS Take 1,000 mg by mouth daily.   . pantoprazole (PROTONIX) 40 MG tablet TAKE ONE  TABLET BY MOUTH EVERY DAY  . prasugrel (EFFIENT) 5 MG TABS tablet TAKE ONE TABLET BY MOUTH EVERY DAY  . pregabalin (LYRICA)  150 MG capsule 300 mg.  . traZODone (DESYREL) 150 MG tablet Take 1 tablet (150 mg total) by mouth at bedtime.     Allergies:   Patient has no known allergies.   Social History   Socioeconomic History  . Marital status: Divorced    Spouse name: Not on file  . Number of children: 2  . Years of education: Not on file  . Highest education level: Not on file  Occupational History  . Occupation: disabled since 2006 due to ortho. heart, psych    Employer: UNEMPLOYED  . Occupation: part time work as an Multimedia programmer, wrestling, and Holiday representative  Social Needs  . Financial resource strain: Not on file  . Food insecurity:    Worry: Not on file    Inability: Not on file  . Transportation needs:    Medical: Not on file    Non-medical: Not on file  Tobacco Use  . Smoking status: Former Smoker    Types: Cigars    Last attempt to quit: 08/28/2010    Years since quitting: 7.5  . Smokeless tobacco: Never Used  . Tobacco comment: 04/18/2016 "smoked 1 cigar/wk when I did smoke"  Substance and Sexual Activity  . Alcohol use: No    Alcohol/week: 0.0 standard drinks    Comment: rarely  . Drug use: No  . Sexual activity: Not Currently  Lifestyle  . Physical activity:    Days per week: Not on file    Minutes per session: Not on file  . Stress: Not on file  Relationships  . Social connections:    Talks on phone: Not on file    Gets together: Not on file    Attends religious service: Not on file    Active member of club or organization: Not on file    Attends meetings of clubs or organizations: Not on file    Relationship status: Not on file  Other Topics Concern  . Not on file  Social History Narrative   Played semi-pro football, used steroids   Divorced moved here from [place], [place]   Patient states he has been on disability since his  knee surgery.      03/05/2013 AHW "[name]" was born and grew up in [place], [place], and moved to [place], [place] at age 34. He has one sister and 3 brothers. He reports that his childhood was rough. His parents are deceased. He graduated from [employer], and attended one year of college. He is currently unemployed and on disability for multiple medical problems. He has been married 5 times. He has 2 sons. He currently lives alone. 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 He denies that he has any social support system. 03/05/2013 AHW           Family History: The patient's family history includes Breast cancer in his mother; Coronary artery disease in his other; Depression in his brother, mother, and other; Diabetes in his father; Early death in his maternal grandfather and paternal grandfather; Healthy in his son and son; Heart attack (age of onset: 71) in his maternal grandfather; Heart disease in his father, maternal grandfather, and mother; Hyperlipidemia in his father; Hypertension in his brother, father, mother, and other; Prostate cancer in his father; Skin cancer in his father. ROS:   Please see the history of present illness.     All other systems reviewed and are negative.  EKGs/Labs/Other Studies Reviewed:  The following studies were reviewed today:  Echocardiogram 01/09/2018 Study Conclusions  - Left ventricle: The cavity size was normal. Systolic function was   normal. The estimated ejection fraction was in the range of 55%   to 60%. Wall motion was normal; there were no regional wall   motion abnormalities. Doppler parameters are consistent with   abnormal left ventricular relaxation (grade 1 diastolic   dysfunction). - Aortic valve: Transvalvular velocity was within the normal range.   There was no stenosis. There was no regurgitation. - Mitral valve: Transvalvular velocity was within the normal range.   There was no  evidence for stenosis. There was trivial   regurgitation. - Right ventricle: The cavity size was normal. Wall thickness was   normal. Systolic function was normal. - Tricuspid valve: There was trivial regurgitation. - Pulmonary arteries: Systolic pressure was within the normal   range. PA peak pressure: 24 mm Hg (S).  EKG:  EKG is  ordered today.   Recent Labs: 01/10/2018: ALT 14; Hemoglobin 13.3; Platelets 293.0; TSH 1.91 01/31/2018: BUN 21; Creatinine, Ser 1.10; Potassium 4.4; Sodium 144   Recent Lipid Panel    Component Value Date/Time   CHOL 129 01/10/2018 1534   TRIG 88.0 01/10/2018 1534   HDL 53.30 01/10/2018 1534   CHOLHDL 2 01/10/2018 1534   VLDL 17.6 01/10/2018 1534   LDLCALC 58 01/10/2018 1534    Physical Exam:    VS:  BP 124/70   Pulse 71   Ht 6' 1"  (1.854 m)   Wt (!) 331 lb (150.1 kg)   SpO2 95%   BMI 43.67 kg/m     Wt Readings from Last 3 Encounters:  02/28/18 (!) 331 lb (150.1 kg)  02/25/18 (!) 336 lb (152.4 kg)  02/21/18 (!) 326 lb (147.9 kg)     Physical Exam  Constitutional: He is oriented to person, place, and time. He appears well-developed and well-nourished. No distress.  HENT:  Head: Normocephalic and atraumatic.  Neck: Normal range of motion. Neck supple. No JVD present.  Cardiovascular: Normal rate and regular rhythm. Exam reveals no gallop and no friction rub.  Murmur heard.  Systolic murmur is present with a grade of 2/6 at the upper left sternal border and lower left sternal border. Pulmonary/Chest: Effort normal and breath sounds normal. No respiratory distress. He has no wheezes. He has no rales.  Abdominal: Soft. Bowel sounds are normal.  Musculoskeletal: Normal range of motion. He exhibits no edema.  Right BKA with prosthetic in place.  Neurological: He is alert and oriented to person, place, and time.  Skin: Skin is warm and dry.  Psychiatric: He has a normal mood and affect. His behavior is normal. Judgment and thought content  normal.  Vitals reviewed.   ASSESSMENT:    1. Coronary artery disease involving native coronary artery of native heart without angina pectoris   2. Coronary artery disease involving native coronary artery of native heart, angina presence unspecified   3. Essential hypertension   4. Hyperlipidemia, unspecified hyperlipidemia type   5. OSA (obstructive sleep apnea)   6. Morbid obesity with BMI of 40.0-44.9, adult ([place])   7. Shortness of breath on exertion    PLAN:    In order of problems listed above:  CAD: History of multiple PCI's (9 stent per pt, last was >10 years ago).  He continues on aggressive secondary prevention including indefinite dual antiplatelet therapy with aspirin and Prasugrel 5 mg daily. With upcoming several surgeries planned and his hx  of CAD and DOE will check lexiscan myoview to rule out myocardial ischemic.  Chronic diastolic CHF: Likely related to history of hypertension, marked morbid obesity, sedentary lifestyle in setting of chronic pain.  He was short of breath with wt gain in August and hydrochlorothiazide 25 mg daily was added. Today he has no edema and wt is stable. He does have DOE with exertion that seems to be chronic. He does not appear to be volume overloaded.   Hypertension: Addition of hydrochlorothiazide in August. BP well controlled.   Morbid obesity: He has started the 6 month process in preparation for wt loss surgery. He has started using the "My Fitness Pal" app for diet and will start with a nutritionist next week. His target for surgery is March or April. Will have him see Dr. Angelena Form in Feb for pre-op eval.   Obstructive sleep apnea: History of sleep apnea diagnosed about 15 years ago, however the patient stopped using CPAP about 10 years ago.  Neurology has ordered a split-night sleep study.  Pre-op evaluation for planned hip replacement and back surgery. According to the RCRI tool, the patient is a class II risk with 0.9% risk of major  cardiac event perioperatively which is average risk. His risk is a little higher due to his decreased functional capacity, partly due to physical limitations and partly due to dyspnea. Recent echo showed normal LV function. I have ordered a lexiscan myoview. If this is normal he can proceed with surgery.  His Prasugrel can be held 5- 7 days prior to the procedures and restarted at 5 mg daily once it is felt safe to do so.  Aspirin 81 mg daily should be continued in the periprocedural period.     Long-term, the patient wishes to follow-up with Dr. Angelena Form, given Dr. Darnelle Bos transition to the [facility] office.    Medication Adjustments/Labs and Tests Ordered: Current medicines are reviewed at length with the patient today.  Concerns regarding medicines are outlined above. Labs and tests ordered and medication changes are outlined in the patient instructions below:  Patient Instructions  Medication Instructions:  Your physician recommends that you continue on your current medications as directed. Please refer to the Current Medication list given to you today.  If you need a refill on your cardiac medications before your next appointment, please call your pharmacy.   Lab work: None Ordered   If you have labs (blood work) drawn today and your tests are completely normal, you will receive your results only by: Travis [name] MyChart Message (if you have MyChart) OR . A paper copy in the mail If you have any lab test that is abnormal or we need to change your treatment, we will call you to review the results.  Testing/Procedures: Your physician has requested that you have a lexiscan myoview. For further information please visit [email or web address]. Please follow instruction sheet, as given.    Follow-Up: Please schedule a follow up visit with Dr. Angelena Form in February   Any Other Special Instructions Will Be Listed Below (If Applicable).   DASH Eating Plan DASH stands for "Dietary Approaches to Stop  Hypertension." The DASH eating plan is a healthy eating plan that has been shown to reduce high blood pressure (hypertension). It may also reduce your risk for type 2 diabetes, heart disease, and stroke. The DASH eating plan may also help with weight loss. What are tips for following this plan? General guidelines  Avoid eating more than 2,300 mg (milligrams) of salt (sodium) a day.  If you have hypertension, you may need to reduce your sodium intake to 1,500 mg a day.  Limit alcohol intake to no more than 1 drink a day for nonpregnant women and 2 drinks a day for men. One drink equals 12 oz of beer, 5 oz of wine, or 1 oz of hard liquor.  Work with your health care provider to maintain a healthy body weight or to lose weight. Ask what an ideal weight is for you.  Get at least 30 minutes of exercise that causes your heart to beat faster (aerobic exercise) most days of the week. Activities may include walking, swimming, or biking.  Work with your health care provider or diet and nutrition specialist (dietitian) to adjust your eating plan to your individual calorie needs. Reading food labels  Check food labels for the amount of sodium per serving. Choose foods with less than 5 percent of the Daily Value of sodium. Generally, foods with less than 300 mg of sodium per serving fit into this eating plan.  To find whole grains, look for the word "whole" as the first word in the ingredient list. Shopping  Buy products labeled as "low-sodium" or "no salt added."  Buy fresh foods. Avoid canned foods and premade or frozen meals. Cooking  Avoid adding salt when cooking. Use salt-free seasonings or herbs instead of table salt or sea salt. Check with your health care provider or pharmacist before using salt substitutes.  Do not fry foods. Cook foods using healthy methods such as baking, boiling, grilling, and broiling instead.  Cook with heart-healthy oils, such as olive, canola, soybean, or sunflower  oil. Meal planning   Eat a balanced diet that includes: ? 5 or more servings of fruits and vegetables each day. At each meal, try to fill half of your plate with fruits and vegetables. ? Up to 6-8 servings of whole grains each day. ? Less than 6 oz of lean meat, poultry, or fish each day. A 3-oz serving of meat is about the same size as a deck of cards. One egg equals 1 oz. ? 2 servings of low-fat dairy each day. ? A serving of nuts, seeds, or beans 5 times each week. ? Heart-healthy fats. Healthy fats called Omega-3 fatty acids are found in foods such as flaxseeds and coldwater fish, like sardines, salmon, and mackerel.  Limit how much you eat of the following: ? Canned or prepackaged foods. ? Food that is high in trans fat, such as fried foods. ? Food that is high in saturated fat, such as fatty meat. ? Sweets, desserts, sugary drinks, and other foods with added sugar. ? Full-fat dairy products.  Do not salt foods before eating.  Try to eat at least 2 vegetarian meals each week.  Eat more home-cooked food and less restaurant, buffet, and fast food.  When eating at a restaurant, ask that your food be prepared with less salt or no salt, if possible. What foods are recommended? The items listed may not be a complete list. Talk with your dietitian about what dietary choices are best for you. Grains Whole-grain or whole-wheat bread. Whole-grain or whole-wheat pasta. Brown rice. Modena Morrow. Bulgur. Whole-grain and low-sodium cereals. Pita bread. Low-fat, low-sodium crackers. Whole-wheat flour tortillas. Vegetables Fresh or frozen vegetables (raw, steamed, roasted, or grilled). Low-sodium or reduced-sodium tomato and vegetable juice. Low-sodium or reduced-sodium tomato sauce and tomato paste. Low-sodium or reduced-sodium canned vegetables. Fruits All fresh, dried, or frozen fruit. Canned fruit in natural juice (without added  sugar). Meat and other protein foods Skinless chicken or  [place]. Ground chicken or [place]. Pork with fat trimmed off. Fish and seafood. Egg whites. Dried beans, peas, or lentils. Unsalted nuts, nut butters, and seeds. Unsalted canned beans. Lean cuts of beef with fat trimmed off. Low-sodium, lean deli meat. Dairy Low-fat (1%) or fat-free (skim) milk. Fat-free, low-fat, or reduced-fat cheeses. Nonfat, low-sodium ricotta or cottage cheese. Low-fat or nonfat yogurt. Low-fat, low-sodium cheese. Fats and oils Soft margarine without trans fats. Vegetable oil. Low-fat, reduced-fat, or light mayonnaise and salad dressings (reduced-sodium). Canola, safflower, olive, soybean, and sunflower oils. Avocado. Seasoning and other foods Herbs. Spices. Seasoning mixes without salt. Unsalted popcorn and pretzels. Fat-free sweets. What foods are not recommended? The items listed may not be a complete list. Talk with your dietitian about what dietary choices are best for you. Grains Baked goods made with fat, such as croissants, muffins, or some breads. Dry pasta or rice meal packs. Vegetables Creamed or fried vegetables. Vegetables in a cheese sauce. Regular canned vegetables (not low-sodium or reduced-sodium). Regular canned tomato sauce and paste (not low-sodium or reduced-sodium). Regular tomato and vegetable juice (not low-sodium or reduced-sodium). Angie Fava. Olives. Fruits Canned fruit in a light or heavy syrup. Fried fruit. Fruit in cream or butter sauce. Meat and other protein foods Fatty cuts of meat. Ribs. Fried meat. Berniece Salines. Sausage. Bologna and other processed lunch meats. Salami. Fatback. Hotdogs. Bratwurst. Salted nuts and seeds. Canned beans with added salt. Canned or smoked fish. Whole eggs or egg yolks. Chicken or [place] with skin. Dairy Whole or 2% milk, cream, and half-and-half. Whole or full-fat cream cheese. Whole-fat or sweetened yogurt. Full-fat cheese. Nondairy creamers. Whipped toppings. Processed cheese and cheese spreads. Fats and oils Butter.  Stick margarine. Lard. Shortening. Ghee. Bacon fat. Tropical oils, such as coconut, palm kernel, or palm oil. Seasoning and other foods Salted popcorn and pretzels. Onion salt, garlic salt, seasoned salt, table salt, and sea salt. Worcestershire sauce. Tartar sauce. Barbecue sauce. Teriyaki sauce. Soy sauce, including reduced-sodium. Steak sauce. Canned and packaged gravies. Fish sauce. Oyster sauce. Cocktail sauce. Horseradish that you find on the shelf. Ketchup. Mustard. Meat flavorings and tenderizers. Bouillon cubes. Hot sauce and Tabasco sauce. Premade or packaged marinades. Premade or packaged taco seasonings. Relishes. Regular salad dressings. Where to find more information:  National Heart, Lung, and [place]: https://wilson-eaton.com/  American Heart Association: www.heart.org Summary  The DASH eating plan is a healthy eating plan that has been shown to reduce high blood pressure (hypertension). It may also reduce your risk for type 2 diabetes, heart disease, and stroke.  With the DASH eating plan, you should limit salt (sodium) intake to 2,300 mg a day. If you have hypertension, you may need to reduce your sodium intake to 1,500 mg a day.  When on the DASH eating plan, aim to eat more fresh fruits and vegetables, whole grains, lean proteins, low-fat dairy, and heart-healthy fats.  Work with your health care provider or diet and nutrition specialist (dietitian) to adjust your eating plan to your individual calorie needs. This information is not intended to replace advice given to you by your health care provider. Make sure you discuss any questions you have with your health care provider. Document Released: 04/27/2011 Document Revised: 05/01/2016 Document Reviewed: 05/01/2016 Elsevier Interactive Patient Education  2018 [place], Daune Perch, NP  02/28/2018 2:37 PM    [place]

## 2018-02-28 NOTE — Patient Instructions (Signed)
Medication Instructions:  Your physician recommends that you continue on your current medications as directed. Please refer to the Current Medication list given to you today.  If you need a refill on your cardiac medications before your next appointment, please call your pharmacy.   Lab work: None Ordered   If you have labs (blood work) drawn today and your tests are completely normal, you will receive your results only by: [name] MyChart Message (if you have MyChart) OR . A paper copy in the mail If you have any lab test that is abnormal or we need to change your treatment, we will call you to review the results.  Testing/Procedures: Your physician has requested that you have a lexiscan myoview. For further information please visit [email or web address]. Please follow instruction sheet, as given.    Follow-Up: Please schedule a follow up visit with Dr. Angelena Form in February   Any Other Special Instructions Will Be Listed Below (If Applicable).   DASH Eating Plan DASH stands for "Dietary Approaches to Stop Hypertension." The DASH eating plan is a healthy eating plan that has been shown to reduce high blood pressure (hypertension). It may also reduce your risk for type 2 diabetes, heart disease, and stroke. The DASH eating plan may also help with weight loss. What are tips for following this plan? General guidelines  Avoid eating more than 2,300 mg (milligrams) of salt (sodium) a day. If you have hypertension, you may need to reduce your sodium intake to 1,500 mg a day.  Limit alcohol intake to no more than 1 drink a day for nonpregnant women and 2 drinks a day for men. One drink equals 12 oz of beer, 5 oz of wine, or 1 oz of hard liquor.  Work with your health care provider to maintain a healthy body weight or to lose weight. Ask what an ideal weight is for you.  Get at least 30 minutes of exercise that causes your heart to beat faster (aerobic exercise) most days of the week. Activities  may include walking, swimming, or biking.  Work with your health care provider or diet and nutrition specialist (dietitian) to adjust your eating plan to your individual calorie needs. Reading food labels  Check food labels for the amount of sodium per serving. Choose foods with less than 5 percent of the Daily Value of sodium. Generally, foods with less than 300 mg of sodium per serving fit into this eating plan.  To find whole grains, look for the word "whole" as the first word in the ingredient list. Shopping  Buy products labeled as "low-sodium" or "no salt added."  Buy fresh foods. Avoid canned foods and premade or frozen meals. Cooking  Avoid adding salt when cooking. Use salt-free seasonings or herbs instead of table salt or sea salt. Check with your health care provider or pharmacist before using salt substitutes.  Do not fry foods. Cook foods using healthy methods such as baking, boiling, grilling, and broiling instead.  Cook with heart-healthy oils, such as olive, canola, soybean, or sunflower oil. Meal planning   Eat a balanced diet that includes: ? 5 or more servings of fruits and vegetables each day. At each meal, try to fill half of your plate with fruits and vegetables. ? Up to 6-8 servings of whole grains each day. ? Less than 6 oz of lean meat, poultry, or fish each day. A 3-oz serving of meat is about the same size as a deck of cards. One egg equals 1 oz. ?  2 servings of low-fat dairy each day. ? A serving of nuts, seeds, or beans 5 times each week. ? Heart-healthy fats. Healthy fats called Omega-3 fatty acids are found in foods such as flaxseeds and coldwater fish, like sardines, salmon, and mackerel.  Limit how much you eat of the following: ? Canned or prepackaged foods. ? Food that is high in trans fat, such as fried foods. ? Food that is high in saturated fat, such as fatty meat. ? Sweets, desserts, sugary drinks, and other foods with added sugar. ? Full-fat  dairy products.  Do not salt foods before eating.  Try to eat at least 2 vegetarian meals each week.  Eat more home-cooked food and less restaurant, buffet, and fast food.  When eating at a restaurant, ask that your food be prepared with less salt or no salt, if possible. What foods are recommended? The items listed may not be a complete list. Talk with your dietitian about what dietary choices are best for you. Grains Whole-grain or whole-wheat bread. Whole-grain or whole-wheat pasta. Brown rice. Modena Morrow. Bulgur. Whole-grain and low-sodium cereals. Pita bread. Low-fat, low-sodium crackers. Whole-wheat flour tortillas. Vegetables Fresh or frozen vegetables (raw, steamed, roasted, or grilled). Low-sodium or reduced-sodium tomato and vegetable juice. Low-sodium or reduced-sodium tomato sauce and tomato paste. Low-sodium or reduced-sodium canned vegetables. Fruits All fresh, dried, or frozen fruit. Canned fruit in natural juice (without added sugar). Meat and other protein foods Skinless chicken or [place]. Ground chicken or [place]. Pork with fat trimmed off. Fish and seafood. Egg whites. Dried beans, peas, or lentils. Unsalted nuts, nut butters, and seeds. Unsalted canned beans. Lean cuts of beef with fat trimmed off. Low-sodium, lean deli meat. Dairy Low-fat (1%) or fat-free (skim) milk. Fat-free, low-fat, or reduced-fat cheeses. Nonfat, low-sodium ricotta or cottage cheese. Low-fat or nonfat yogurt. Low-fat, low-sodium cheese. Fats and oils Soft margarine without trans fats. Vegetable oil. Low-fat, reduced-fat, or light mayonnaise and salad dressings (reduced-sodium). Canola, safflower, olive, soybean, and sunflower oils. Avocado. Seasoning and other foods Herbs. Spices. Seasoning mixes without salt. Unsalted popcorn and pretzels. Fat-free sweets. What foods are not recommended? The items listed may not be a complete list. Talk with your dietitian about what dietary choices are best  for you. Grains Baked goods made with fat, such as croissants, muffins, or some breads. Dry pasta or rice meal packs. Vegetables Creamed or fried vegetables. Vegetables in a cheese sauce. Regular canned vegetables (not low-sodium or reduced-sodium). Regular canned tomato sauce and paste (not low-sodium or reduced-sodium). Regular tomato and vegetable juice (not low-sodium or reduced-sodium). Angie Fava. Olives. Fruits Canned fruit in a light or heavy syrup. Fried fruit. Fruit in cream or butter sauce. Meat and other protein foods Fatty cuts of meat. Ribs. Fried meat. Berniece Salines. Sausage. Bologna and other processed lunch meats. Salami. Fatback. Hotdogs. Bratwurst. Salted nuts and seeds. Canned beans with added salt. Canned or smoked fish. Whole eggs or egg yolks. Chicken or [place] with skin. Dairy Whole or 2% milk, cream, and half-and-half. Whole or full-fat cream cheese. Whole-fat or sweetened yogurt. Full-fat cheese. Nondairy creamers. Whipped toppings. Processed cheese and cheese spreads. Fats and oils Butter. Stick margarine. Lard. Shortening. Ghee. Bacon fat. Tropical oils, such as coconut, palm kernel, or palm oil. Seasoning and other foods Salted popcorn and pretzels. Onion salt, garlic salt, seasoned salt, table salt, and sea salt. Worcestershire sauce. Tartar sauce. Barbecue sauce. Teriyaki sauce. Soy sauce, including reduced-sodium. Steak sauce. Canned and packaged gravies. Fish sauce. Oyster sauce. Cocktail sauce.  Horseradish that you find on the shelf. Ketchup. Mustard. Meat flavorings and tenderizers. Bouillon cubes. Hot sauce and Tabasco sauce. Premade or packaged marinades. Premade or packaged taco seasonings. Relishes. Regular salad dressings. Where to find more information:  National Heart, Lung, and [place]: https://wilson-eaton.com/  American Heart Association: www.heart.org Summary  The DASH eating plan is a healthy eating plan that has been shown to reduce high blood pressure  (hypertension). It may also reduce your risk for type 2 diabetes, heart disease, and stroke.  With the DASH eating plan, you should limit salt (sodium) intake to 2,300 mg a day. If you have hypertension, you may need to reduce your sodium intake to 1,500 mg a day.  When on the DASH eating plan, aim to eat more fresh fruits and vegetables, whole grains, lean proteins, low-fat dairy, and heart-healthy fats.  Work with your health care provider or diet and nutrition specialist (dietitian) to adjust your eating plan to your individual calorie needs. This information is not intended to replace advice given to you by your health care provider. Make sure you discuss any questions you have with your health care provider. Document Released: 04/27/2011 Document Revised: 05/01/2016 Document Reviewed: 05/01/2016 Elsevier Interactive Patient Education  [employer].

## 2018-03-06 ENCOUNTER — Ambulatory Visit (INDEPENDENT_AMBULATORY_CARE_PROVIDER_SITE_OTHER): Payer: Medicare HMO | Admitting: Orthopaedic Surgery

## 2018-03-06 ENCOUNTER — Ambulatory Visit (INDEPENDENT_AMBULATORY_CARE_PROVIDER_SITE_OTHER): Payer: Medicare HMO | Admitting: Psychology

## 2018-03-06 ENCOUNTER — Encounter (INDEPENDENT_AMBULATORY_CARE_PROVIDER_SITE_OTHER): Payer: Self-pay | Admitting: Orthopaedic Surgery

## 2018-03-06 DIAGNOSIS — M25551 Pain in right hip: Secondary | ICD-10-CM

## 2018-03-06 DIAGNOSIS — F332 Major depressive disorder, recurrent severe without psychotic features: Secondary | ICD-10-CM

## 2018-03-06 DIAGNOSIS — M1611 Unilateral primary osteoarthritis, right hip: Secondary | ICD-10-CM

## 2018-03-06 DIAGNOSIS — R6889 Other general symptoms and signs: Secondary | ICD-10-CM | POA: Diagnosis not present

## 2018-03-06 NOTE — Progress Notes (Signed)
The patient is a pleasant 67 year old gentleman sent to me from Dr. Louanne Skye to consider a right hip replacement on.  He does have known osteoarthritis in his right hip and has had a intra-articular injection in that hip that did help.  Unfortunately he is someone who is morbidly obese with a BMI of 44 combined with the fact that he has a below-knee amputation on the side of his hip arthritis.  He does get around with a BKA prosthesis.  I talked to him in detail and he understands that I only perform anterior hip replacement surgery and we could not do this on the special table that we performed the surgery on based on his below-knee amputation and prosthesis.  I would not be able to set the table up successfully to do this surgery and I am not comfortable trying this at all based on his BKA and his morbid obesity.

## 2018-03-11 ENCOUNTER — Encounter (INDEPENDENT_AMBULATORY_CARE_PROVIDER_SITE_OTHER): Payer: Self-pay | Admitting: Physical Medicine and Rehabilitation

## 2018-03-11 ENCOUNTER — Ambulatory Visit (INDEPENDENT_AMBULATORY_CARE_PROVIDER_SITE_OTHER): Payer: Medicare HMO | Admitting: Physical Medicine and Rehabilitation

## 2018-03-11 ENCOUNTER — Ambulatory Visit (INDEPENDENT_AMBULATORY_CARE_PROVIDER_SITE_OTHER): Payer: Self-pay

## 2018-03-11 VITALS — BP 152/70 | HR 65 | Temp 98.2°F

## 2018-03-11 DIAGNOSIS — M961 Postlaminectomy syndrome, not elsewhere classified: Secondary | ICD-10-CM

## 2018-03-11 DIAGNOSIS — R6889 Other general symptoms and signs: Secondary | ICD-10-CM | POA: Diagnosis not present

## 2018-03-11 DIAGNOSIS — M5416 Radiculopathy, lumbar region: Secondary | ICD-10-CM | POA: Diagnosis not present

## 2018-03-11 DIAGNOSIS — M48061 Spinal stenosis, lumbar region without neurogenic claudication: Secondary | ICD-10-CM

## 2018-03-11 MED ORDER — BETAMETHASONE SOD PHOS & ACET 6 (3-3) MG/ML IJ SUSP
12.0000 mg | Freq: Once | INTRAMUSCULAR | Status: AC
  Administered 2018-03-11: 12 mg

## 2018-03-11 NOTE — Procedures (Signed)
Lumbosacral Transforaminal Epidural Steroid Injection - Sub-Pedicular Approach with Fluoroscopic Guidance  Patient: Travis Wickens Sr.      Date of Birth: 1950/12/27 MRN: [id number] PCP: [name], MD      Visit Date: 03/11/2018   Universal Protocol:    Date/Time: 03/11/2018  Consent Given By: the patient  Position: PRONE  Additional Comments: Vital signs were monitored before and after the procedure. Patient was prepped and draped in the usual sterile fashion. The correct patient, procedure, and site was verified.   Injection Procedure Details:  Procedure Site One Meds Administered:  Meds ordered this encounter  Medications  . betamethasone acetate-betamethasone sodium phosphate (CELESTONE) injection 12 mg    Laterality: Bilateral  Location/Site:  L4-L5  Needle size: 22 G  Needle type: Spinal  Needle Placement: Transforaminal  Findings:    -Comments: Excellent flow of contrast along the nerve and into the epidural space.  Procedure Details: After squaring off the end-plates to get a true AP view, the C-arm was positioned so that an oblique view of the foramen as noted above was visualized. The target area is just inferior to the "nose of the scotty dog" or sub pedicular. The soft tissues overlying this structure were infiltrated with 2-3 ml. of 1% Lidocaine without Epinephrine.  The spinal needle was inserted toward the target using a "trajectory" view along the fluoroscope beam.  Under AP and lateral visualization, the needle was advanced so it did not puncture dura and was located close the 6 O'Clock position of the pedical in AP tracterory. Biplanar projections were used to confirm position. Aspiration was confirmed to be negative for CSF and/or blood. A 1-2 ml. volume of Isovue-250 was injected and flow of contrast was noted at each level. Radiographs were obtained for documentation purposes.   After attaining the desired flow of contrast documented above, a  0.5 to 1.0 ml test dose of 0.25% Marcaine was injected into each respective transforaminal space.  The patient was observed for 90 seconds post injection.  After no sensory deficits were reported, and normal lower extremity motor function was noted,   the above injectate was administered so that equal amounts of the injectate were placed at each foramen (level) into the transforaminal epidural space.   Additional Comments:  The patient tolerated the procedure well Dressing: Band-Aid    Post-procedure details: Patient was observed during the procedure. Post-procedure instructions were reviewed.  Patient left the clinic in stable condition.

## 2018-03-11 NOTE — Progress Notes (Signed)
Travis Mckeithan Sr. - 67 y.o. male MRN [id number]  Date of birth: 1950-11-18  Office Visit Note: Visit Date: 03/11/2018 PCP: [name], MD Referred by: [name], MD  Subjective: Chief Complaint  Patient presents with  . Lower Back - Pain   HPI:  Travis Douthat Sr. is a 67 y.o. male who comes in today Planned bilateral L4 transforaminal steroid injection.  Injection requested by Dr. [name].  His notes can be reviewed.  We have seen the patient has completed similar injection back in May with decent symptoms.  Patient's had prior lumbar surgery.  MRI is reviewed below.  ROS Otherwise per HPI.  Assessment & Plan: Visit Diagnoses:  1. Lumbar radiculopathy   2. Post laminectomy syndrome   3. Foraminal stenosis of lumbar region     Plan: No additional findings.   Meds & Orders:  Meds ordered this encounter  Medications  . betamethasone acetate-betamethasone sodium phosphate (CELESTONE) injection 12 mg    Orders Placed This Encounter  Procedures  . XR C-ARM NO REPORT  . Epidural Steroid injection    Follow-up: Return if symptoms worsen or fail to improve.   Procedures: No procedures performed  Lumbosacral Transforaminal Epidural Steroid Injection - Sub-Pedicular Approach with Fluoroscopic Guidance  Patient: Travis Beckford Sr.      Date of Birth: Feb 19, 1951 MRN: [id number] PCP: [name], MD      Visit Date: 03/11/2018   Universal Protocol:    Date/Time: 03/11/2018  Consent Given By: the patient  Position: PRONE  Additional Comments: Vital signs were monitored before and after the procedure. Patient was prepped and draped in the usual sterile fashion. The correct patient, procedure, and site was verified.   Injection Procedure Details:  Procedure Site One Meds Administered:  Meds ordered this encounter  Medications  . betamethasone acetate-betamethasone sodium phosphate (CELESTONE) injection 12 mg    Laterality:  Bilateral  Location/Site:  L4-L5  Needle size: 22 G  Needle type: Spinal  Needle Placement: Transforaminal  Findings:    -Comments: Excellent flow of contrast along the nerve and into the epidural space.  Procedure Details: After squaring off the end-plates to get a true AP view, the C-arm was positioned so that an oblique view of the foramen as noted above was visualized. The target area is just inferior to the "nose of the scotty dog" or sub pedicular. The soft tissues overlying this structure were infiltrated with 2-3 ml. of 1% Lidocaine without Epinephrine.  The spinal needle was inserted toward the target using a "trajectory" view along the fluoroscope beam.  Under AP and lateral visualization, the needle was advanced so it did not puncture dura and was located close the 6 O'Clock position of the pedical in AP tracterory. Biplanar projections were used to confirm position. Aspiration was confirmed to be negative for CSF and/or blood. A 1-2 ml. volume of Isovue-250 was injected and flow of contrast was noted at each level. Radiographs were obtained for documentation purposes.   After attaining the desired flow of contrast documented above, a 0.5 to 1.0 ml test dose of 0.25% Marcaine was injected into each respective transforaminal space.  The patient was observed for 90 seconds post injection.  After no sensory deficits were reported, and normal lower extremity motor function was noted,   the above injectate was administered so that equal amounts of the injectate were placed at each foramen (level) into the transforaminal epidural space.   Additional Comments:  The patient tolerated  the procedure well Dressing: Band-Aid    Post-procedure details: Patient was observed during the procedure. Post-procedure instructions were reviewed.  Patient left the clinic in stable condition.     Clinical History: MRI LUMBAR SPINE WITHOUT AND WITH CONTRAST  TECHNIQUE: Multiplanar and  multiecho pulse sequences of the lumbar spine were obtained without and with intravenous contrast.  CONTRAST:  71m MULTIHANCE GADOBENATE DIMEGLUMINE 529 MG/ML IV SOLN  COMPARISON:  MRI lumbar spine August 30, 2015  FINDINGS: SEGMENTATION: For the purposes of this report, the last well-formed intervertebral disc is reported as L5-S1.  ALIGNMENT: Maintained lumbar lordosis. Minimal grade 1 L2-3 retrolisthesis without spondylolysis. Broad dextroscoliosis evident on axial sequences.  VERTEBRAE: Vertebral bodies are intact. Severe L5-S1 disc height loss, unchanged. Worsened severe L2-3 and a moderate L4-5 disc height loss with desiccation. Stable mild disc height loss at the remaining levels with desiccation. Multilevel mild and moderate chronic discogenic endplate changes without acute or abnormal bone marrow signal. Mild ventral disc enhancement T11-12 and T12-L1 associated mild disc edema.  CONUS MEDULLARIS AND CAUDA EQUINA: Conus medullaris terminates at T12-L1 and demonstrates normal morphology and signal characteristics. Cauda equina is normal. No abnormal cord, leptomeningeal or epidural enhancement.  PARASPINAL AND OTHER SOFT TISSUES: Nonacute. Moderate to severe lower paraspinal muscle atrophy slightly progressed from prior imaging.  DISC LEVELS:  T12-L1: No disc bulge, canal stenosis nor neural foraminal narrowing.  L1-2: Moderate broad-based disc bulge, superimposed large RIGHT extraforaminal disc protrusion similar to prior examination which could affect the exited RIGHT L1 nerve. Mild facet arthropathy and ligamentum flavum redundancy. Mild canal stenosis. Mild bilateral caudal neural foraminal narrowing.  L2-3: Retrolisthesis. Endplate spurring, mild facet arthropathy and ligamentum flavum redundancy. Minimal canal stenosis. Minimal neural foraminal narrowing.  L3-4: Annular bulging eccentric laterally. Moderate facet arthropathy. No canal  stenosis. Mild bilateral neural foraminal narrowing.  L4-5: Posterior decompression without canal stenosis. Similar to decreased small broad-based disc bulge. Mild facet arthropathy. Moderate bilateral neural foraminal narrowing. Similar encroachment upon the exited RIGHT L4 nerve.  L5-S1: Endplate spurring without disc bulge. Mild facet arthropathy. No canal stenosis. Mild RIGHT and minimal LEFT neural foraminal narrowing.  IMPRESSION: 1. Status post L4-5 laminectomies. 2. Stable minimal L2-3 retrolisthesis without spondylolysis. Progressed spondylosis without fracture or acute osseous process. 3. Mild canal stenosis L1-2, minimal at L2-3. 4. Neural foraminal narrowing L1-2 through L5-S1: Moderate at L4-5.   Electronically Signed   By: CElon AlasM.D.   On: 09/05/2017 13:57     Objective:  VS:  HT:    WT:   BMI:     BP:(!) 152/70  HR:65bpm  TEMP:98.2 F (36.8 C)( )  RESP:96 % Physical Exam  Ortho Exam Imaging: Xr C-arm No Report  Result Date: 03/11/2018 Please see Notes tab for imaging impression.

## 2018-03-11 NOTE — Progress Notes (Signed)
Numeric Pain Rating Scale and Functional Assessment Average Pain 7  With medication  In the last MONTH (on 0-10 scale) has pain interfered with the following?  1. General activity like being  able to carry out your everyday physical activities such as walking, climbing stairs, carrying groceries, or moving a chair?  Rating(6)   +Driver, -BT (stopped Thursday night), -Dye Allergies.

## 2018-03-11 NOTE — Patient Instructions (Signed)

## 2018-03-12 ENCOUNTER — Telehealth: Payer: Self-pay | Admitting: Neurology

## 2018-03-12 ENCOUNTER — Encounter: Payer: Medicare HMO | Attending: General Surgery | Admitting: Skilled Nursing Facility1

## 2018-03-12 ENCOUNTER — Encounter: Payer: Self-pay | Admitting: Skilled Nursing Facility1

## 2018-03-12 DIAGNOSIS — Z713 Dietary counseling and surveillance: Secondary | ICD-10-CM | POA: Insufficient documentation

## 2018-03-12 DIAGNOSIS — I10 Essential (primary) hypertension: Secondary | ICD-10-CM | POA: Diagnosis not present

## 2018-03-12 DIAGNOSIS — Z955 Presence of coronary angioplasty implant and graft: Secondary | ICD-10-CM | POA: Insufficient documentation

## 2018-03-12 DIAGNOSIS — E119 Type 2 diabetes mellitus without complications: Secondary | ICD-10-CM | POA: Diagnosis not present

## 2018-03-12 DIAGNOSIS — R6889 Other general symptoms and signs: Secondary | ICD-10-CM | POA: Diagnosis not present

## 2018-03-12 DIAGNOSIS — Z6841 Body Mass Index (BMI) 40.0 and over, adult: Secondary | ICD-10-CM | POA: Insufficient documentation

## 2018-03-12 DIAGNOSIS — F319 Bipolar disorder, unspecified: Secondary | ICD-10-CM | POA: Diagnosis not present

## 2018-03-12 DIAGNOSIS — Z89511 Acquired absence of right leg below knee: Secondary | ICD-10-CM | POA: Insufficient documentation

## 2018-03-12 DIAGNOSIS — E669 Obesity, unspecified: Secondary | ICD-10-CM

## 2018-03-12 DIAGNOSIS — Z7902 Long term (current) use of antithrombotics/antiplatelets: Secondary | ICD-10-CM | POA: Insufficient documentation

## 2018-03-12 DIAGNOSIS — M160 Bilateral primary osteoarthritis of hip: Secondary | ICD-10-CM | POA: Diagnosis not present

## 2018-03-12 DIAGNOSIS — G4733 Obstructive sleep apnea (adult) (pediatric): Secondary | ICD-10-CM | POA: Insufficient documentation

## 2018-03-12 DIAGNOSIS — I2510 Atherosclerotic heart disease of native coronary artery without angina pectoris: Secondary | ICD-10-CM | POA: Diagnosis not present

## 2018-03-12 DIAGNOSIS — G894 Chronic pain syndrome: Secondary | ICD-10-CM | POA: Insufficient documentation

## 2018-03-12 DIAGNOSIS — Z79899 Other long term (current) drug therapy: Secondary | ICD-10-CM | POA: Diagnosis not present

## 2018-03-12 NOTE — Progress Notes (Signed)
Sleeve Assessment: 2nd  SWL Appointment.   According to pts referral the pt needs 6 SWL with 2 assessments. Pt state he works with a professional for depression talk therapy and medication management.   Pt does have right below the knee amputation.  Pt states he has a goal of 2000 calories. Pt states working with his phycologist has been helpful. Pt states he has been working on hunger verses appetite.Pt does get meals on wheels.  Pt arrives having maintained weight. Pt was worried dietitian would be upset and insurance would not cover him. Pt states he has been working on not drinking with meals, not eating sweets-diluting orange juice. Giving the sweets to his dogs. Pt states his son is getting married in [place] this weekend.  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 Pt states to control his emotional eating he walk to the steps or bedside exercises. Pt states he has cut out his afternoon snacks. Still working with mental health professional and it is going well. Pt states he gets his partials in 3 weeks. Pt states he tracks his food on my fitness pal.   Start weight at [facility]: 317.8 (this weight is with his prosthesis) Weight: 325 (this weight is with his prosthesis) BMI: 47.82  MEDICATIONS: see list   DIETARY INTAKE:  24 hr Dietary Recall: 3 meals a day eating every 3-5 hours First Meal: 2 bacon and 1 eggs and 2 pieces of toast and dilute orange juice Snack:  Second Meal: meals on wheels  Snack 2pm: sandwich or leftover chicken with vegetables  Third Meal: 5 dollar pizza from [employer] or hamburger or hotdog or chicken or fish  Snack 9pm: popcorn Beverages: 4 liters a day of water, 1% milk, diluted juice, beer  Usual physical activity: ADL's due to pain  Diet to Follow: 2000 calories 225 g carbohydrates 150 g protein 56 g fat   Nutritional Diagnosis:  [place]-3.3 Overweight/obesity related to past poor dietary habits and physical inactivity as evidenced by patient w/ planned Sleeve  surgery following dietary guidelines for continued weight loss.    Intervention:  Nutrition counseling for upcoming Bariatric Surgery. Goals: -Encouraged to engage in 150 minutes of moderate physical activity including cardiovascular and weight baring weekly -Continue to make those great changes! -Work on keeping fat in the single digits -Aim to get vegetables with dinner 7 days a week  Teaching Method Utilized:  Visual Auditory Hands on  Handouts given during visit include:  Pre-op goals  Barriers to learning/adherence to lifestyle change: emotional eating  Demonstrated degree of understanding via:  Teach Back   Monitoring/Evaluation:  Dietary intake, exercise,and body weight prn.

## 2018-03-12 NOTE — Telephone Encounter (Signed)
I called to follow up with the pt on this matter. I am not sure whom the pt spoke to. The pt stated it was a male that he spoke to that told him they had no record of his sleep study being ordered. I informed the pt that the sleep lab received the order on 02/25/18. We submitted for PA from [facility] on 02/26/18. At this time we are waiting to hear back from [facility]. Pt verbalized understanding this information.

## 2018-03-12 NOTE — Telephone Encounter (Signed)
Pt states he called the sleep lab and was refred back to the office because they have no record of a sleep study pending for him.  Pt is asking for a call re: where things stand on his sleep study

## 2018-03-13 ENCOUNTER — Ambulatory Visit (INDEPENDENT_AMBULATORY_CARE_PROVIDER_SITE_OTHER): Payer: Medicare HMO | Admitting: Orthopedic Surgery

## 2018-03-13 ENCOUNTER — Encounter (INDEPENDENT_AMBULATORY_CARE_PROVIDER_SITE_OTHER): Payer: Self-pay | Admitting: Orthopedic Surgery

## 2018-03-13 VITALS — Ht 72.0 in | Wt 352.6 lb

## 2018-03-13 DIAGNOSIS — M961 Postlaminectomy syndrome, not elsewhere classified: Secondary | ICD-10-CM | POA: Diagnosis not present

## 2018-03-13 DIAGNOSIS — M1611 Unilateral primary osteoarthritis, right hip: Secondary | ICD-10-CM | POA: Diagnosis not present

## 2018-03-13 DIAGNOSIS — Z6841 Body Mass Index (BMI) 40.0 and over, adult: Secondary | ICD-10-CM

## 2018-03-13 DIAGNOSIS — M79609 Pain in unspecified limb: Secondary | ICD-10-CM | POA: Diagnosis not present

## 2018-03-13 DIAGNOSIS — R6889 Other general symptoms and signs: Secondary | ICD-10-CM | POA: Diagnosis not present

## 2018-03-13 DIAGNOSIS — T8789 Other complications of amputation stump: Secondary | ICD-10-CM | POA: Diagnosis not present

## 2018-03-13 NOTE — Progress Notes (Signed)
Office Visit Note   Patient: Travis Romanoski Sr.           Date of Birth: 05-14-1951           MRN: [id number] Visit Date: 03/13/2018              Requested by: [name], MD [address], [place] [address] PCP: [name], MD  Chief Complaint  Patient presents with  . Right Hip - Pain    "Referred by Dr Ninfa Linden for Right Hip Surgery"      HPI: Patient is a 67 year old gentleman who was seen in follow-up for osteoarthritis of his right hip.  Patient states he cannot perform his activities of daily living due to the right hip pain.  Patient was seen by Dr. Ninfa Linden and was unable to be a candidate for an anterior hip replacement due to the amputation and the pannus.  Patient is seen for evaluation for right total hip arthroplasty.  Patient states he is undergoing a cardiac stress test on November 4 and 5.  He did see his nutritionist yesterday he is 6 foot 325 pounds BMI of 44.    Assessment & Plan: Visit Diagnoses:  1. Unilateral primary osteoarthritis, right hip   2. Body mass index 40.0-44.9, adult ([place])   3. Morbid obesity ([place])     Plan: We will plan for a posterior lateral approach for a right total hip arthroplasty we will ensure patient is a candidate with a BMI greater than 40.  Anticipate patient will need short-term skilled nursing placement.  Risks and benefits were discussed including increased risk of infection and difficulty with wound healing.  Follow-Up Instructions: Return if symptoms worsen or fail to improve.   Ortho Exam  Patient is alert, oriented, no adenopathy, well-dressed, normal affect, normal respiratory effort. Examination patient has difficulty getting from a sitting to a standing position he uses a cane.  He has essentially no range of motion of the right hip with pain with any attempted range of motion.  Patient's most recent hemoglobin A1c is 5.5.  Albumin 4.6.  Imaging: No results found. No images are attached to the  encounter.  Labs: Lab Results  Component Value Date   HGBA1C 5.5 01/10/2018   HGBA1C 5.4 07/13/2017   HGBA1C 5.7 03/01/2017   ESRSEDRATE 20 (H) 09/03/2013   ESRSEDRATE 11 03/25/2013   CRP 10.7 (H) 09/03/2013   REPTSTATUS 01/30/2014 FINAL 01/29/2014   GRAMSTAIN  09/02/2013    WBC PRESENT, PREDOMINANTLY MONONUCLEAR NO ORGANISMS SEEN CYTOSPIN Performed by [facility] Performed at [place]  09/02/2013    NO ORGANISMS SEEN WBC PRESENT, PREDOMINANTLY MONONUCLEAR CYTOSPIN Gram Stain Report Called to,Read Back By and Verified With: A LEDWELL RN 1931 09/02/13 A NAVARRO   CULT NO GROWTH Performed at [facility] 01/29/2014   LABORGA STAPHYLOCOCCUS SPECIES (COAGULASE NEGATIVE) 04/08/2015     Lab Results  Component Value Date   ALBUMIN 4.6 01/10/2018   ALBUMIN 4.7 07/13/2017   ALBUMIN 4.1 03/01/2017    Body mass index is 47.82 kg/m.  Orders:  No orders of the defined types were placed in this encounter.  No orders of the defined types were placed in this encounter.    Procedures: No procedures performed  Clinical Data: No additional findings.  ROS:  All other systems negative, except as noted in the HPI. Review of Systems  Objective: Vital Signs: Ht 6' (1.829 m)  Wt (!) 352 lb 9.6 oz (159.9 kg)   BMI 47.82 kg/m   Specialty Comments:  No specialty comments available.  PMFS History: Patient Active Problem List   Diagnosis Date Noted  . Morbid obesity with BMI of 40.0-44.9, adult ([place]) 02/28/2018  . Myocardial infarction ([place]) 02/25/2018  . Coronary artery disease involving native coronary artery of native heart 02/25/2018  . PAD (peripheral artery disease) ([place]) 02/25/2018  . S/P BKA (below knee amputation) unilateral, right ([place]) 02/25/2018  . Sacroiliitis ([place]) 02/25/2018  . SOB (shortness of breath) 01/03/2018  . Acute on chronic heart failure ([place]) 01/03/2018  . Severe right groin pain 12/20/2017  . Morbid  obesity ([place]) 10/09/2017  . Exposure to sexually transmitted disease (STD) 07/14/2017  . Low back pain 07/13/2017  . Leukoplakia, tongue 01/26/2017  . Acute sinus infection 10/20/2016  . Hyperglycemia 10/20/2016  . Skin lesion 10/20/2016  . S/P unilateral BKA (below knee amputation), right ([place]) 06/14/2016  . Charcot foot due to diabetes mellitus ([place])   . Charcot's arthropathy associated with type 2 diabetes mellitus ([place]) 04/11/2016  . Preventative health care 11/05/2015  . Major depression 09/13/2015  . S/P TKR (total knee replacement) bilaterally 09/13/2015  . GERD (gastroesophageal reflux disease) 09/08/2015  . S/P laparoscopic hernia repair 05/11/2015  . PPD positive 04/08/2015  . Benign neoplasm of descending colon   . Benign neoplasm of cecum   . AKI (acute kidney injury) ([place]) 10/18/2014  . Acute blood loss anemia   . Chronic anticoagulation   . Occult blood positive stool 10/17/2014  . General weakness 07/14/2014  . Urinary incontinence 07/14/2014  . Headache(784.0) 10/15/2013  . Spinal stenosis in cervical region 09/26/2013    Class: Chronic  . Spinal stenosis, lumbar region, with neurogenic claudication 09/26/2013    Class: Chronic  . Hand joint pain 06/10/2013  . Rotator cuff tear arthropathy of both shoulders 06/10/2013  . Skin lesion of cheek 05/01/2013  . Pain of right thumb 04/03/2013  . Balance disorder 03/12/2013  . Gait disorder 03/12/2013  . Tremor 03/12/2013  . Left hip pain 03/12/2013  . Pre-ulcerative corn or callous 02/06/2013  . Anxiety 11/12/2011  . OSA (obstructive sleep apnea) 11/07/2011  . Bradycardia 10/20/2011  . Insomnia 10/04/2011  . Obesity 01/12/2011  . ERECTILE DYSFUNCTION, ORGANIC 05/30/2010  . [place] DISEASE, LUMBAR 04/19/2010  . SCIATICA, LEFT 04/19/2010  . Chronic pain syndrome 10/27/2009  . Hyperlipidemia 07/15/2009  . Essential hypertension 06/24/2009  . Coronary artery disease involving native coronary artery of native heart  without angina pectoris 06/24/2009  . Allergic rhinitis 06/24/2009  . URETHRAL STRICTURE 06/24/2009  . DEGENERATIVE JOINT DISEASE 06/24/2009  . SHOULDER PAIN, BILATERAL 06/24/2009  . FATIGUE 06/24/2009  . NEPHROLITHIASIS, HX OF 06/24/2009   Past Medical History:  Diagnosis Date  . ALLERGIC RHINITIS 06/24/2009  . Allergic rhinitis 06/24/2009   Qualifier: Diagnosis of  By: [name], [name]   . Anxiety 11/12/2011   Adequate for discharge   . Arthritis    "all my joints" (09/30/2013)  . Arthritis of foot, right, degenerative 04/15/2014  . Balance disorder 03/12/2013  . Benign neoplasm of cecum   . Benign neoplasm of descending colon   . CAD (coronary artery disease) 06/24/2009   5 stents placed in 2007    . Chronic anticoagulation   . Chronic pain syndrome 10/27/2009   of ankle, shoulders, low back.  sciatica.   . Closed fracture of right foot 10/17/2014  . CORONARY ARTERY DISEASE 06/24/2009  a. s/p multiple PCIs - In 07-29-2006 he had a Taxus DES to the mild LAD, Endeavor DES to mid LCX and distal LCX. In January 2009 he had DES to distal LCX, mid LCX and proximal LCX. In November 2009 had BMS x 2 to the mid RCA. Cath 10/2011 with patent stents, noncardiac CP. LHC 01/2013: patent stents (noncardiac CP).  . DEGENERATIVE JOINT DISEASE 06/24/2009   Qualifier: Diagnosis of  By: [name], [name]   . Depression   . Depression with anxiety    Prior suicide attempt  . [place] DISEASE, LUMBAR 04/19/2010  . ERECTILE DYSFUNCTION, ORGANIC 05/30/2010  . Essential hypertension 06/24/2009   Qualifier: Diagnosis of  By: [name], [name]   . Fibromyalgia   . Fracture dislocation of ankle joint 09/02/2015  . Gait disorder 03/12/2013  . General weakness 07/14/2014  . GERD (gastroesophageal reflux disease) 09/08/2015  . Hand joint pain 06/10/2013  . Heart murmur   . Hepatitis C   . History of kidney stones   . Hyperlipidemia 07/15/2009   Qualifier: Diagnosis of  By: Aundra Dubin, MD, [place]    . HYPERLIPIDEMIA-MIXED 07/15/2009  .  HYPERTENSION 06/24/2009  . Insomnia 10/04/2011  . Irregular heart beat   . Left hip pain 03/12/2013   Injected under ultrasound guidance on June 24, 2013   . Major depression 09/13/2015  . Myocardial infarction ([place]) 07/29/06  . Non-cardiac chest pain 10/2011, 01/2013  . Obesity   . Occult blood positive stool 10/17/2014  . Open ankle fracture 09/02/2015  . OSA (obstructive sleep apnea)    not using CPAP (09/30/2013)  . Pain of right thumb 04/03/2013  . Pneumonia   . PPD positive 04/08/2015  . Pre-ulcerative corn or callous 02/06/2013  . Restless legs   . Rotator cuff tear arthropathy of both shoulders 06/10/2013   History of bilateral shoulder cuff surgery for rotator cuff tears. Reports increase in pain 09/11/2015 during physical therapy of the left shoulder.   [name] SCIATICA, LEFT 04/19/2010   Qualifier: Diagnosis of  By: [name], [name]   . Spinal stenosis in cervical region 09/26/2013  . Spinal stenosis, lumbar region, with neurogenic claudication 09/26/2013  . Type II diabetes mellitus ([place]) 07-29-2010   no meds in 09/2014.   [name] Uncontrolled type 2 DM with peripheral circulatory disorder ([place]) 10/04/2013  . URETHRAL STRICTURE 06/24/2009   self catheterizes.     Family History  Problem Relation Age of Onset  . Depression Mother   . Heart disease Mother   . Hypertension Mother   . Breast cancer Mother   . Diabetes Father   . Heart disease Father        CABG  . Hypertension Father   . Hyperlipidemia Father   . Prostate cancer Father   . Skin cancer Father   . Depression Brother        x 2  . Hypertension Brother        x2  . Healthy Son   . Heart disease Maternal Grandfather   . Early death Maternal Grandfather   . Heart attack Maternal Grandfather 07/30/2063  . Early death Paternal Grandfather   . Coronary artery disease Other   . Hypertension Other   . Depression Other   . Healthy Son     Past Surgical History:  Procedure Laterality Date  . AMPUTATION Right 06/14/2016   Procedure:  AMPUTATION BELOW KNEE;  Surgeon: Newt Minion, MD;  Location: [place];  Service: Orthopedics;  Laterality: Right;  . ANKLE FUSION Right 04/15/2014   Procedure: Right Subtalar, Talonavicular Fusion;  Surgeon: Newt Minion, MD;  Location: [place];  Service: Orthopedics;  Laterality: Right;  . ANKLE FUSION Right 04/18/2016   Right Ankle Tibiocalcaneal Fusion; Removal of deep retained hardware; application of wound vac/notes 04/18/2016  . ANKLE FUSION Right 04/18/2016   Procedure: Right Ankle Tibiocalcaneal Fusion;  Surgeon: Newt Minion, MD;  Location: [place];  Service: Orthopedics;  Laterality: Right;  . ANTERIOR CERVICAL DECOMP/DISCECTOMY FUSION N/A 09/26/2013   Procedure: ANTERIOR CERVICAL DISCECTOMY FUSION C3-4, plate and screw fixation, allograft bone graft;  Surgeon: Jessy Oto, MD;  Location: [place];  Service: Orthopedics;  Laterality: N/A;  . BACK SURGERY    . BELOW KNEE LEG AMPUTATION Right 06/14/2016  . CARDIAC CATHETERIZATION  X 1  . CARPAL TUNNEL RELEASE Bilateral   . COLONOSCOPY    . COLONOSCOPY N/A 10/22/2014   Procedure: COLONOSCOPY;  Surgeon: Lafayette Dragon, MD;  Location: [facility] ENDOSCOPY;  Service: Endoscopy;  Laterality: N/A;  . CORONARY ANGIOPLASTY WITH STENT PLACEMENT     "I have 9 stents"  . ESOPHAGOGASTRODUODENOSCOPY N/A 10/19/2014   Procedure: ESOPHAGOGASTRODUODENOSCOPY (EGD);  Surgeon: Jerene Bears, MD;  Location: [facility] ENDOSCOPY;  Service: Endoscopy;  Laterality: N/A;  . FUSION OF TALONAVICULAR JOINT Right 04/15/2014   dr duda  . HERNIA REPAIR     umbilical  . INGUINAL HERNIA REPAIR Right 05/11/2015   Procedure: LAPAROSCOPIC REPAIR RIGHT  INGUINAL HERNIA;  Surgeon: Greer Pickerel, MD;  Location: [place];  Service: General;  Laterality: Right;  . INSERTION OF MESH Right 05/11/2015   Procedure: INSERTION OF MESH;  Surgeon: Greer Pickerel, MD;  Location: [place];  Service: General;  Laterality: Right;  . JOINT REPLACEMENT Bilateral    knees  . KNEE CARTILAGE SURGERY Right X 12   "~ 1/2  open; ~ 1/2 scopes"  . KNEE CARTILAGE SURGERY Left X 3   "3 scopes"  . LEFT HEART CATHETERIZATION WITH CORONARY ANGIOGRAM N/A 02/10/2013   Procedure: LEFT HEART CATHETERIZATION WITH CORONARY ANGIOGRAM;  Surgeon: Burnell Blanks, MD;  Location[facility] CATH LAB;  Service: Cardiovascular;  Laterality: N/A;  . LUMBAR LAMINECTOMY/DECOMPRESSION MICRODISCECTOMY N/A 01/27/2014   Procedure: CENTRAL LUMBAR LAMINECTOMY L4-5 AND L3-4;  Surgeon: Jessy Oto, MD;  Location: [place];  Service: Orthopedics;  Laterality: N/A;  . ORIF ANKLE FRACTURE Right 09/02/2015   Procedure: OPEN REDUCTION INTERNAL FIXATION (ORIF) ANKLE FRACTURE;  Surgeon: Newt Minion, MD;  Location: [place];  Service: Orthopedics;  Laterality: Right;  . PERIPHERALLY INSERTED CENTRAL CATHETER INSERTION  09/02/2015  . SHOULDER ARTHROSCOPY W/ ROTATOR CUFF REPAIR Bilateral    "3 on the right; 1 on the left"  . SKIN SPLIT GRAFT Right 10/01/2015   Procedure: RIGHT ANKLE APPLY SKIN GRAFT SPLIT THICKNESS;  Surgeon: Newt Minion, MD;  Location: [place];  Service: Orthopedics;  Laterality: Right;  . TONSILLECTOMY    . TOTAL KNEE ARTHROPLASTY Bilateral 2008  . UMBILICAL HERNIA REPAIR     UHR  . URETHRAL DILATION  X 4  . VASECTOMY     Social History   Occupational History  . Occupation: disabled since 2006 due to ortho. heart, psych    Employer: UNEMPLOYED  . Occupation: part time work as an Multimedia programmer, wrestling, and Holiday representative  Tobacco Use  . Smoking status: Former Smoker    Types: Cigars    Last attempt to quit: 08/28/2010  Years since quitting: 7.5  . Smokeless tobacco: Never Used  . Tobacco comment: 04/18/2016 "smoked 1 cigar/wk when I did smoke"  Substance and Sexual Activity  . Alcohol use: No    Alcohol/week: 0.0 standard drinks    Comment: rarely  . Drug use: No  . Sexual activity: Not Currently

## 2018-03-19 ENCOUNTER — Telehealth (HOSPITAL_COMMUNITY): Payer: Self-pay | Admitting: *Deleted

## 2018-03-19 NOTE — Telephone Encounter (Signed)
Patient given detailed instructions per Myocardial Perfusion Study Information Sheet for the test on 03/25/18 at 1230. Patient notified to arrive 15 minutes early and that it is imperative to arrive on time for appointment to keep from having the test rescheduled.  If you need to cancel or reschedule your appointment, please call the office within 24 hours of your appointment. . Patient verbalized understanding.Travis [name], Travis [name]

## 2018-03-25 ENCOUNTER — Ambulatory Visit (HOSPITAL_COMMUNITY): Payer: Medicare HMO | Attending: Cardiology

## 2018-03-25 DIAGNOSIS — R0602 Shortness of breath: Secondary | ICD-10-CM | POA: Diagnosis not present

## 2018-03-25 DIAGNOSIS — R6889 Other general symptoms and signs: Secondary | ICD-10-CM | POA: Diagnosis not present

## 2018-03-25 MED ORDER — REGADENOSON 0.4 MG/5ML IV SOLN
0.4000 mg | Freq: Once | INTRAVENOUS | Status: AC
  Administered 2018-03-25: 0.4 mg via INTRAVENOUS

## 2018-03-25 MED ORDER — TECHNETIUM TC 99M TETROFOSMIN IV KIT
32.5000 | PACK | Freq: Once | INTRAVENOUS | Status: AC | PRN
  Administered 2018-03-25: 32.5 via INTRAVENOUS
  Filled 2018-03-25: qty 33

## 2018-03-26 ENCOUNTER — Ambulatory Visit (HOSPITAL_COMMUNITY): Payer: Medicare HMO | Attending: Internal Medicine

## 2018-03-26 ENCOUNTER — Ambulatory Visit (INDEPENDENT_AMBULATORY_CARE_PROVIDER_SITE_OTHER): Payer: Medicare HMO | Admitting: Psychology

## 2018-03-26 DIAGNOSIS — F332 Major depressive disorder, recurrent severe without psychotic features: Secondary | ICD-10-CM | POA: Diagnosis not present

## 2018-03-26 DIAGNOSIS — R6889 Other general symptoms and signs: Secondary | ICD-10-CM | POA: Diagnosis not present

## 2018-03-26 LAB — MYOCARDIAL PERFUSION IMAGING
CHL CUP NUCLEAR SDS: 2
CHL CUP NUCLEAR SSS: 4
CHL CUP RESTING HR STRESS: 62 {beats}/min
LV sys vol: 84 mL
LVDIAVOL: 159 mL (ref 62–150)
NUC STRESS TID: 0.85
Peak HR: 77 {beats}/min
SRS: 2

## 2018-03-26 MED ORDER — TECHNETIUM TC 99M TETROFOSMIN IV KIT
32.2000 | PACK | Freq: Once | INTRAVENOUS | Status: AC | PRN
  Administered 2018-03-26: 32.2 via INTRAVENOUS
  Filled 2018-03-26: qty 33

## 2018-03-28 ENCOUNTER — Other Ambulatory Visit: Payer: Self-pay | Admitting: Internal Medicine

## 2018-03-29 NOTE — Telephone Encounter (Signed)
Done erx 

## 2018-04-02 DIAGNOSIS — R6889 Other general symptoms and signs: Secondary | ICD-10-CM | POA: Diagnosis not present

## 2018-04-03 ENCOUNTER — Ambulatory Visit (INDEPENDENT_AMBULATORY_CARE_PROVIDER_SITE_OTHER): Payer: Self-pay | Admitting: Physician Assistant

## 2018-04-05 ENCOUNTER — Ambulatory Visit (INDEPENDENT_AMBULATORY_CARE_PROVIDER_SITE_OTHER): Payer: Medicare HMO | Admitting: Specialist

## 2018-04-05 ENCOUNTER — Encounter (INDEPENDENT_AMBULATORY_CARE_PROVIDER_SITE_OTHER): Payer: Self-pay | Admitting: Specialist

## 2018-04-05 ENCOUNTER — Telehealth: Payer: Self-pay | Admitting: Internal Medicine

## 2018-04-05 VITALS — BP 138/72 | HR 68 | Ht 72.0 in | Wt 352.0 lb

## 2018-04-05 DIAGNOSIS — R6889 Other general symptoms and signs: Secondary | ICD-10-CM | POA: Diagnosis not present

## 2018-04-05 DIAGNOSIS — M2170 Unequal limb length (acquired), unspecified site: Secondary | ICD-10-CM | POA: Diagnosis not present

## 2018-04-05 DIAGNOSIS — M1611 Unilateral primary osteoarthritis, right hip: Secondary | ICD-10-CM

## 2018-04-05 DIAGNOSIS — Z6841 Body Mass Index (BMI) 40.0 and over, adult: Secondary | ICD-10-CM | POA: Diagnosis not present

## 2018-04-05 DIAGNOSIS — Z89511 Acquired absence of right leg below knee: Secondary | ICD-10-CM

## 2018-04-05 DIAGNOSIS — M5136 Other intervertebral disc degeneration, lumbar region: Secondary | ICD-10-CM

## 2018-04-05 DIAGNOSIS — M51369 Other intervertebral disc degeneration, lumbar region without mention of lumbar back pain or lower extremity pain: Secondary | ICD-10-CM

## 2018-04-05 DIAGNOSIS — M4726 Other spondylosis with radiculopathy, lumbar region: Secondary | ICD-10-CM | POA: Diagnosis not present

## 2018-04-05 NOTE — Telephone Encounter (Signed)
You will have to confirm what type of walker. It sounds like the wide 4 wheeled walker.

## 2018-04-05 NOTE — Telephone Encounter (Signed)
I am confused, is he asking for a Bariatric Walker?  I have not heard of this before

## 2018-04-05 NOTE — Patient Instructions (Addendum)
Avoid bending, stooping and avoid lifting weights greater than 10 lbs. Avoid prolong standing and walking. Avoid frequent bending and stooping  No lifting greater than 10 lbs. May use ice or moist heat for pain. Weight loss is of benefit. Handicap license is approved. Dr. Romona Curls secretary/Assistant will call to arrange for epidural steroid injection  Hip is suffering from osteoarthritis, only real proven treatments are Weight loss, NSIADs like diclofenac and exercise. Well padded shoes help. Ice the knee 2-3 times a day 15-20 mins at a time.

## 2018-04-05 NOTE — Progress Notes (Addendum)
Office Visit Note   Patient: Travis Elvin Sr.           Date of Birth: [birth year]           MRN: [id number] Visit Date: 04/05/2018              Requested by: [name], MD [address], [place] [address] PCP: [name], MD   Assessment & Plan: Visit Diagnoses:  1. Acquired leg length discrepancy   2. Unilateral primary osteoarthritis, right hip   3. Other spondylosis with radiculopathy, lumbar region   4. Degenerative disc disease, lumbar   5. History of right below knee amputation ([place])     Plan: Avoid bending, stooping and avoid lifting weights greater than 10 lbs. Avoid prolong standing and walking. Avoid frequent bending and stooping  No lifting greater than 10 lbs. May use ice or moist heat for pain. Weight loss is of benefit. Handicap license is approved. Dr. Romona Curls secretary/Assistant will call to arrange for epidural steroid injection  Hip is suffering from osteoarthritis, only real proven treatments are Weight loss, NSIADs like diclofenac and exercise. Well padded shoes help. Ice the knee 2-3 times a day 15-20 mins at a time.  Follow-Up Instructions: Return in about 8 weeks (around 05/31/2018).   Orders:  Orders Placed This Encounter  Procedures  . For home use only DME Walker wide  . Ambulatory referral to Physical Medicine Rehab   No orders of the defined types were placed in this encounter.     Procedures: No procedures performed   Clinical Data: No additional findings.   Subjective: Chief Complaint  Patient presents with  . Lower Back - Follow-up    67 year old male with history of lumbar laminectomy surgery for spinal stenosis about 3 years ago with improved standing and walking he had severe planovalgus right foot with valgus at the right ankle and eventually broke down and after and with severe pain he underwent a right BKA with good relief. He is scheduled for right total hip replacement with Dr. Sharol Given in the next  1-2 weeks. Previous bilateral total knee replacement in [place] greater than 10 years ago (11-12). He has left leg pain from the left hip to the back. Doesn't travel past the buttocks. He has pain with straight leg raise on the left. In the AM he is having difficulty with accident and also has some loose stool with accidents.    Review of Systems  Constitutional: Negative.  Negative for activity change, appetite change, chills, diaphoresis, fatigue, fever and unexpected weight change.  HENT: Negative.   Eyes: Negative.   Respiratory: Negative.   Cardiovascular: Negative.   Gastrointestinal: Negative.   Endocrine: Negative.   Genitourinary: Negative.   Musculoskeletal: Negative.   Skin: Negative.   Allergic/Immunologic: Negative.   Neurological: Negative.   Hematological: Negative.   Psychiatric/Behavioral: Negative.      Objective: Vital Signs: BP 138/72 (BP Location: Left Arm, Patient Position: Sitting)   Pulse 68   Ht 6' (1.829 m)   Wt (!) 352 lb (159.7 kg)   BMI 47.74 kg/m   Physical Exam  Constitutional: He is oriented to person, place, and time. He appears well-developed and well-nourished.  HENT:  Head: Normocephalic and atraumatic.  Eyes: Pupils are equal, round, and reactive to light. EOM are normal.  Neck: Normal range of motion. Neck supple.  Pulmonary/Chest: Effort normal and breath sounds normal.  Abdominal: Soft. Bowel sounds are normal.  Neurological: He is alert and oriented to person, place, and time.  Skin: Skin is warm and dry.  Psychiatric: He has a normal mood and affect. His behavior is normal. Judgment and thought content normal.   Body mass index is 47.74 kg/m.  Back Exam   Tenderness  The patient is experiencing tenderness in the lumbar.  Range of Motion  Extension: normal  Flexion: normal  Lateral bend right: normal  Lateral bend left: normal  Rotation right: normal  Rotation left: normal   Muscle Strength  Right Quadriceps:  5/5    Left Quadriceps:  5/5  Right Hamstrings:  5/5  Left Hamstrings:  5/5   Tests  Straight leg raise right: negative Straight leg raise left: positive  Reflexes  Patellar: 0/4 Achilles: 0/4 Babinski's sign: normal   Other  Toe walk: normal Heel walk: normal Sensation: normal Gait: normal  Erythema: no back redness Scars: absent      Specialty Comments:  No specialty comments available.  Imaging: No results found.   PMFS History: Patient Active Problem List   Diagnosis Date Noted  . Spinal stenosis in cervical region 09/26/2013    Priority: High    Class: Chronic  . Spinal stenosis, lumbar region, with neurogenic claudication 09/26/2013    Priority: High    Class: Chronic  . Morbid obesity with BMI of 40.0-44.9, adult ([place]) 02/28/2018  . Myocardial infarction ([place]) 02/25/2018  . Coronary artery disease involving native coronary artery of native heart 02/25/2018  . PAD (peripheral artery disease) ([place]) 02/25/2018  . S/P BKA (below knee amputation) unilateral, right ([place]) 02/25/2018  . Sacroiliitis ([place]) 02/25/2018  . SOB (shortness of breath) 01/03/2018  . Acute on chronic heart failure ([place]) 01/03/2018  . Severe right groin pain 12/20/2017  . Morbid obesity ([place]) 10/09/2017  . Exposure to sexually transmitted disease (STD) 07/14/2017  . Low back pain 07/13/2017  . Leukoplakia, tongue 01/26/2017  . Acute sinus infection 10/20/2016  . Hyperglycemia 10/20/2016  . Skin lesion 10/20/2016  . S/P unilateral BKA (below knee amputation), right ([place]) 06/14/2016  . Charcot foot due to diabetes mellitus ([place])   . Charcot's arthropathy associated with type 2 diabetes mellitus ([place]) 04/11/2016  . Preventative health care 11/05/2015  . Major depression 09/13/2015  . S/P TKR (total knee replacement) bilaterally 09/13/2015  . GERD (gastroesophageal reflux disease) 09/08/2015  . S/P laparoscopic hernia repair 05/11/2015  . PPD positive 04/08/2015  . Benign neoplasm of  descending colon   . Benign neoplasm of cecum   . AKI (acute kidney injury) ([place]) 10/18/2014  . Acute blood loss anemia   . Chronic anticoagulation   . Occult blood positive stool 10/17/2014  . General weakness 07/14/2014  . Urinary incontinence 07/14/2014  . Headache(784.0) 10/15/2013  . Hand joint pain 06/10/2013  . Rotator cuff tear arthropathy of both shoulders 06/10/2013  . Skin lesion of cheek 05/01/2013  . Pain of right thumb 04/03/2013  . Balance disorder 03/12/2013  . Gait disorder 03/12/2013  . Tremor 03/12/2013  . Left hip pain 03/12/2013  . Pre-ulcerative corn or callous 02/06/2013  . Anxiety 11/12/2011  . OSA (obstructive sleep apnea) 11/07/2011  . Bradycardia 10/20/2011  . Insomnia 10/04/2011  . Obesity 01/12/2011  . ERECTILE DYSFUNCTION, ORGANIC 05/30/2010  . [place] DISEASE, LUMBAR 04/19/2010  . SCIATICA, LEFT 04/19/2010  . Chronic pain syndrome 10/27/2009  . Hyperlipidemia 07/15/2009  . Essential hypertension 06/24/2009  . Coronary artery disease involving native coronary artery of native heart without angina pectoris  06/24/2009  . Allergic rhinitis 06/24/2009  . URETHRAL STRICTURE 06/24/2009  . DEGENERATIVE JOINT DISEASE 06/24/2009  . SHOULDER PAIN, BILATERAL 06/24/2009  . FATIGUE 06/24/2009  . NEPHROLITHIASIS, HX OF 06/24/2009   Past Medical History:  Diagnosis Date  . ALLERGIC RHINITIS 06/24/2009  . Allergic rhinitis 06/24/2009   Qualifier: Diagnosis of  By: [name], [name]   . Anxiety 11/12/2011   Adequate for discharge   . Arthritis    "all my joints" (09/30/2013)  . Arthritis of foot, right, degenerative 04/15/2014  . Balance disorder 03/12/2013  . Benign neoplasm of cecum   . Benign neoplasm of descending colon   . CAD (coronary artery disease) 06/24/2009   5 stents placed in 2007    . Chronic anticoagulation   . Chronic pain syndrome 10/27/2009   of ankle, shoulders, low back.  sciatica.   . Closed fracture of right foot 10/17/2014  . CORONARY  ARTERY DISEASE 06/24/2009   a. s/p multiple PCIs - In 2008 he had a Taxus DES to the mild LAD, Endeavor DES to mid LCX and distal LCX. In January 2009 he had DES to distal LCX, mid LCX and proximal LCX. In November 2009 had BMS x 2 to the mid RCA. Cath 10/2011 with patent stents, noncardiac CP. LHC 01/2013: patent stents (noncardiac CP).  . DEGENERATIVE JOINT DISEASE 06/24/2009   Qualifier: Diagnosis of  By: [name], [name]   . Depression   . Depression with anxiety    Prior suicide attempt  . [place] DISEASE, LUMBAR 04/19/2010  . ERECTILE DYSFUNCTION, ORGANIC 05/30/2010  . Essential hypertension 06/24/2009   Qualifier: Diagnosis of  By: [name], [name]   . Fibromyalgia   . Fracture dislocation of ankle joint 09/02/2015  . Gait disorder 03/12/2013  . General weakness 07/14/2014  . GERD (gastroesophageal reflux disease) 09/08/2015  . Hand joint pain 06/10/2013  . Heart murmur   . Hepatitis C   . History of kidney stones   . Hyperlipidemia 07/15/2009   Qualifier: Diagnosis of  By: Aundra Dubin, MD, [place]    . HYPERLIPIDEMIA-MIXED 07/15/2009  . HYPERTENSION 06/24/2009  . Insomnia 10/04/2011  . Irregular heart beat   . Left hip pain 03/12/2013   Injected under ultrasound guidance on June 24, 2013   . Major depression 09/13/2015  . Myocardial infarction ([place]) 2008  . Non-cardiac chest pain 10/2011, 01/2013  . Obesity   . Occult blood positive stool 10/17/2014  . Open ankle fracture 09/02/2015  . OSA (obstructive sleep apnea)    not using CPAP (09/30/2013)  . Pain of right thumb 04/03/2013  . Pneumonia   . PPD positive 04/08/2015  . Pre-ulcerative corn or callous 02/06/2013  . Restless legs   . Rotator cuff tear arthropathy of both shoulders 06/10/2013   History of bilateral shoulder cuff surgery for rotator cuff tears. Reports increase in pain 09/11/2015 during physical therapy of the left shoulder.   [name] SCIATICA, LEFT 04/19/2010   Qualifier: Diagnosis of  By: [name], [name]   . Spinal stenosis in  cervical region 09/26/2013  . Spinal stenosis, lumbar region, with neurogenic claudication 09/26/2013  . Type II diabetes mellitus ([place]) 2012   no meds in 09/2014.   [name] Uncontrolled type 2 DM with peripheral circulatory disorder ([place]) 10/04/2013  . URETHRAL STRICTURE 06/24/2009   self catheterizes.     Family History  Problem Relation Age of Onset  . Depression Mother   . Heart disease Mother   .  Hypertension Mother   . Breast cancer Mother   . Diabetes Father   . Heart disease Father        CABG  . Hypertension Father   . Hyperlipidemia Father   . Prostate cancer Father   . Skin cancer Father   . Depression Brother        x 2  . Hypertension Brother        x2  . Healthy Son   . Heart disease Maternal Grandfather   . Early death Maternal Grandfather   . Heart attack Maternal Grandfather 08-07-63  . Early death Paternal Grandfather   . Coronary artery disease Other   . Hypertension Other   . Depression Other   . Healthy Son     Past Surgical History:  Procedure Laterality Date  . AMPUTATION Right 06/14/2016   Procedure: AMPUTATION BELOW KNEE;  Surgeon: Newt Minion, MD;  Location: [facility];  Service: Orthopedics;  Laterality: Right;  . ANKLE FUSION Right 04/15/2014   Procedure: Right Subtalar, Talonavicular Fusion;  Surgeon: Newt Minion, MD;  Location: [facility];  Service: Orthopedics;  Laterality: Right;  . ANKLE FUSION Right 04/18/2016   Right Ankle Tibiocalcaneal Fusion; Removal of deep retained hardware; application of wound vac/notes 04/18/2016  . ANKLE FUSION Right 04/18/2016   Procedure: Right Ankle Tibiocalcaneal Fusion;  Surgeon: Newt Minion, MD;  Location: [facility];  Service: Orthopedics;  Laterality: Right;  . ANTERIOR CERVICAL DECOMP/DISCECTOMY FUSION N/A 09/26/2013   Procedure: ANTERIOR CERVICAL DISCECTOMY FUSION C3-4, plate and screw fixation, allograft bone graft;  Surgeon: Jessy Oto, MD;  Location: [facility];  Service: Orthopedics;  Laterality: N/A;  . BACK SURGERY    . BELOW  KNEE LEG AMPUTATION Right 06/14/2016  . CARDIAC CATHETERIZATION  X 1  . CARPAL TUNNEL RELEASE Bilateral   . COLONOSCOPY    . COLONOSCOPY N/A 10/22/2014   Procedure: COLONOSCOPY;  Surgeon: Lafayette Dragon, MD;  Location: [facility] ENDOSCOPY;  Service: Endoscopy;  Laterality: N/A;  . CORONARY ANGIOPLASTY WITH STENT PLACEMENT     "I have 9 stents"  . ESOPHAGOGASTRODUODENOSCOPY N/A 10/19/2014   Procedure: ESOPHAGOGASTRODUODENOSCOPY (EGD);  Surgeon: Jerene Bears, MD;  [facility] END[facility]COPY;  Service: Endoscopy;  Laterality: N/A;  . FUSION OF TALONAVICULAR JOINT Right 04/15/2014   dr duda  . HERNIA REPAIR     umbilical  . INGUINAL HERNIA REPAIR Right 05/11/2015   Procedure: LAPAROSCOPIC REPAIR RIGHT  INGUINAL HERNIA;  Surgeon: Greer Pickerel, MD;  Location: [facility];  Service: General;  Laterality: Right;  . INSERTION OF MESH Right 05/11/2015   Procedure: INSERTION OF MESH;  Surgeon: Greer Pickerel, MD;  Location: [facility];  Service: General;  Laterality: Right;  . JOINT REPLACEMENT Bilateral    knees  . KNEE CARTILAGE SURGERY Right X 12   "~ 1/2 open; ~ 1/2 scopes"  . KNEE CARTILAGE SURGERY Left X 3   "3 scopes"  . LEFT HEART CATHETERIZATION WITH CORONARY ANGIOGRAM N/A 02/10/2013   Procedure: LEFT HEART CATHETERIZATION WITH CORONARY ANGIOGRAM;  Surgeon: Burnell Blanks, MD;  Location: [facility] CATH LAB;  Service: Cardiovascular;  Laterality: N/A;  . LUMBAR LAMINECTOMY/DECOMPRESSION MICRODISCECTOMY N/A 01/27/2014   Procedure: CENTRAL LUMBAR LAMINECTOMY L4-5 AND L3-4;  Surgeon: Jessy Oto, MD;  Location: [facility];  Service: Orthopedics;  Laterality: N/A;  . ORIF ANKLE FRACTURE Right 09/02/2015   Procedure: OPEN REDUCTION INTERNAL FIXATION (ORIF) ANKLE FRACTURE;  Surgeon: Newt Minion, MD;  Location: [facility];  Service: Orthopedics;  Laterality: Right;  . PERIPHERALLY INSERTED CENTRAL CATHETER INSERTION  09/02/2015  . SHOULDER ARTHROSCOPY W/ ROTATOR CUFF REPAIR Bilateral    "3 on the right; 1 on the left"  . SKIN SPLIT  GRAFT Right 10/01/2015   Procedure: RIGHT ANKLE APPLY SKIN GRAFT SPLIT THICKNESS;  Surgeon: Newt Minion, MD;  Location: [facility];  Service: Orthopedics;  Laterality: Right;  . TONSILLECTOMY    . TOTAL KNEE ARTHROPLASTY Bilateral 2008  . UMBILICAL HERNIA REPAIR     UHR  . URETHRAL DILATION  X 4  . VASECTOMY     Social History   Occupational History  . Occupation: disabled since 2006 due to ortho. heart, psych    Employer: UNEMPLOYED  . Occupation: part time work as an Multimedia programmer, wrestling, and Holiday representative  Tobacco Use  . Smoking status: Former Smoker    Types: Cigars    Last attempt to quit: 08/28/2010    Years since quitting: 7.6  . Smokeless tobacco: Never Used  . Tobacco comment: 04/18/2016 "smoked 1 cigar/wk when I did smoke"  Substance and Sexual Activity  . Alcohol use: No    Alcohol/week: 0.0 standard drinks    Comment: rarely  . Drug use: No  . Sexual activity: Not Currently

## 2018-04-05 NOTE — Telephone Encounter (Signed)
Copied from [place] [id number]. Topic: Quick Communication - See Telephone Encounter >> Apr 05, 2018  2:06 PM Rayann Heman wrote: CRM for notification. See Telephone encounter for: 04/05/18. Pt called and stated that his ortho doctor stated that he will need a bariatric walker. Please advice [phone]

## 2018-04-06 ENCOUNTER — Other Ambulatory Visit: Payer: Self-pay | Admitting: Internal Medicine

## 2018-04-08 DIAGNOSIS — R6889 Other general symptoms and signs: Secondary | ICD-10-CM | POA: Diagnosis not present

## 2018-04-10 ENCOUNTER — Ambulatory Visit (INDEPENDENT_AMBULATORY_CARE_PROVIDER_SITE_OTHER): Payer: Medicare HMO | Admitting: Neurology

## 2018-04-10 DIAGNOSIS — G894 Chronic pain syndrome: Secondary | ICD-10-CM

## 2018-04-10 DIAGNOSIS — Z7189 Other specified counseling: Secondary | ICD-10-CM

## 2018-04-10 DIAGNOSIS — F5105 Insomnia due to other mental disorder: Secondary | ICD-10-CM

## 2018-04-10 DIAGNOSIS — G4734 Idiopathic sleep related nonobstructive alveolar hypoventilation: Principal | ICD-10-CM

## 2018-04-10 DIAGNOSIS — I739 Peripheral vascular disease, unspecified: Secondary | ICD-10-CM

## 2018-04-10 DIAGNOSIS — G4733 Obstructive sleep apnea (adult) (pediatric): Secondary | ICD-10-CM

## 2018-04-10 DIAGNOSIS — Z89511 Acquired absence of right leg below knee: Secondary | ICD-10-CM

## 2018-04-10 DIAGNOSIS — R6889 Other general symptoms and signs: Secondary | ICD-10-CM | POA: Diagnosis not present

## 2018-04-10 DIAGNOSIS — F5104 Psychophysiologic insomnia: Secondary | ICD-10-CM

## 2018-04-10 DIAGNOSIS — M461 Sacroiliitis, not elsewhere classified: Secondary | ICD-10-CM

## 2018-04-10 DIAGNOSIS — M1611 Unilateral primary osteoarthritis, right hip: Secondary | ICD-10-CM | POA: Diagnosis not present

## 2018-04-10 DIAGNOSIS — F99 Mental disorder, not otherwise specified: Secondary | ICD-10-CM

## 2018-04-10 DIAGNOSIS — I2129 ST elevation (STEMI) myocardial infarction involving other sites: Secondary | ICD-10-CM

## 2018-04-10 DIAGNOSIS — I2510 Atherosclerotic heart disease of native coronary artery without angina pectoris: Secondary | ICD-10-CM

## 2018-04-10 DIAGNOSIS — M961 Postlaminectomy syndrome, not elsewhere classified: Secondary | ICD-10-CM | POA: Diagnosis not present

## 2018-04-10 NOTE — Pre-Procedure Instructions (Signed)
[place]  04/10/2018      [address], [place], [address] [place] [address] Phone: [phone] Fax: [phone]    Your procedure is scheduled on Wednesday November 27th.  Report to [facility] Admitting at [place].M.  Call this number if you have problems the morning of surgery:  [phone]   Remember:  Do not eat or drink after midnight.    Take these medicines the morning of surgery with A SIP OF WATER   atorvastatin (LIPITOR)  cyclobenzaprine (FLEXERIL) if needed  DULoxetine (CYMBALTA)  HYDROcodone-acetaminophen (NORCO)  If needed  pregabalin (LYRICA)  Follow your surgeon's instructions on when to stop/resume your Aspirin and prasugrel (EFFIENT). If no instructions were given to you, call your surgeon's office.  7 days prior to surgery STOP taking any Aspirin(unless otherwise instructed by your surgeon), Aleve, Naproxen, Ibuprofen, Motrin, Advil, Goody's, BC's, all herbal medications, fish oil, and all vitamins     Do not wear jewelry.  Do not wear lotions, powders, or colognes, or deodorant.  Men may shave face and neck.  Do not bring valuables to the hospital.  [facility] is not responsible for any belongings or valuables.  Contacts, dentures or bridgework may not be worn into surgery.  Leave your suitcase in the car.  After surgery it may be brought to your room.  For patients admitted to the hospital, discharge time will be determined by your treatment team.  Patients discharged the day of surgery will not be allowed to drive home.    [facility]- Preparing For Surgery  Before surgery, you can play an important role. Because skin is not sterile, your skin needs to be as free of germs as possible. You can reduce the number of germs on your skin by washing with CHG (chlorahexidine gluconate) Soap before surgery.  CHG is an antiseptic cleaner which kills germs  and bonds with the skin to continue killing germs even after washing.    Oral Hygiene is also important to reduce your risk of infection.  Remember - BRUSH YOUR TEETH THE MORNING OF SURGERY WITH YOUR REGULAR TOOTHPASTE  Please do not use if you have an allergy to CHG or antibacterial soaps. If your skin becomes reddened/irritated stop using the CHG.  Do not shave (including legs and underarms) for at least 48 hours prior to first CHG shower. It is OK to shave your face.  Please follow these instructions carefully.   1. Shower the NIGHT BEFORE SURGERY and the MORNING OF SURGERY with CHG.   2. If you chose to wash your hair, wash your hair first as usual with your normal shampoo.  3. After you shampoo, rinse your hair and body thoroughly to remove the shampoo.  4. Use CHG as you would any other liquid soap. You can apply CHG directly to the skin and wash gently with a scrungie or a clean washcloth.   5. Apply the CHG Soap to your body ONLY FROM THE NECK DOWN.  Do not use on open wounds or open sores. Avoid contact with your eyes, ears, mouth and genitals (private parts). Wash Face and genitals (private parts)  with your normal soap.  6. Wash thoroughly, paying special attention to the area where your surgery will be performed.  7. Thoroughly rinse your body with warm water from the neck down.  8. DO NOT shower/wash with your normal soap after  using and rinsing off the CHG Soap.  9. Pat yourself dry with a CLEAN TOWEL.  10. Wear CLEAN PAJAMAS to bed the night before surgery, wear comfortable clothes the morning of surgery  11. Place CLEAN SHEETS on your bed the night of your first shower and DO NOT SLEEP WITH PETS.    Day of Surgery:  Do not apply any deodorants/lotions.  Please wear clean clothes to the hospital/surgery center.   Remember to brush your teeth WITH YOUR REGULAR TOOTHPASTE.    Please read over the following fact sheets that you were given.

## 2018-04-11 ENCOUNTER — Telehealth (INDEPENDENT_AMBULATORY_CARE_PROVIDER_SITE_OTHER): Payer: Self-pay | Admitting: Orthopedic Surgery

## 2018-04-11 ENCOUNTER — Encounter (HOSPITAL_COMMUNITY): Payer: Self-pay

## 2018-04-11 ENCOUNTER — Other Ambulatory Visit: Payer: Self-pay

## 2018-04-11 ENCOUNTER — Ambulatory Visit (INDEPENDENT_AMBULATORY_CARE_PROVIDER_SITE_OTHER): Payer: Medicare HMO | Admitting: Psychology

## 2018-04-11 ENCOUNTER — Other Ambulatory Visit (HOSPITAL_COMMUNITY): Payer: Self-pay | Admitting: Psychiatry

## 2018-04-11 ENCOUNTER — Encounter (HOSPITAL_COMMUNITY)
Admission: RE | Admit: 2018-04-11 | Discharge: 2018-04-11 | Disposition: A | Discharge status: Home / Self Care | Payer: Medicare HMO | Source: Ambulatory Visit | Attending: Orthopedic Surgery | Admitting: Orthopedic Surgery

## 2018-04-11 DIAGNOSIS — F332 Major depressive disorder, recurrent severe without psychotic features: Secondary | ICD-10-CM | POA: Diagnosis not present

## 2018-04-11 DIAGNOSIS — Z6841 Body Mass Index (BMI) 40.0 and over, adult: Secondary | ICD-10-CM | POA: Insufficient documentation

## 2018-04-11 DIAGNOSIS — E669 Obesity, unspecified: Secondary | ICD-10-CM | POA: Diagnosis not present

## 2018-04-11 DIAGNOSIS — I2510 Atherosclerotic heart disease of native coronary artery without angina pectoris: Secondary | ICD-10-CM | POA: Diagnosis not present

## 2018-04-11 DIAGNOSIS — F319 Bipolar disorder, unspecified: Secondary | ICD-10-CM

## 2018-04-11 DIAGNOSIS — M1611 Unilateral primary osteoarthritis, right hip: Secondary | ICD-10-CM | POA: Insufficient documentation

## 2018-04-11 DIAGNOSIS — Z01812 Encounter for preprocedural laboratory examination: Secondary | ICD-10-CM | POA: Insufficient documentation

## 2018-04-11 DIAGNOSIS — Z7982 Long term (current) use of aspirin: Secondary | ICD-10-CM | POA: Diagnosis not present

## 2018-04-11 DIAGNOSIS — K219 Gastro-esophageal reflux disease without esophagitis: Secondary | ICD-10-CM | POA: Insufficient documentation

## 2018-04-11 DIAGNOSIS — R6889 Other general symptoms and signs: Secondary | ICD-10-CM | POA: Diagnosis not present

## 2018-04-11 DIAGNOSIS — F411 Generalized anxiety disorder: Secondary | ICD-10-CM

## 2018-04-11 DIAGNOSIS — Z89511 Acquired absence of right leg below knee: Secondary | ICD-10-CM | POA: Insufficient documentation

## 2018-04-11 DIAGNOSIS — Z79899 Other long term (current) drug therapy: Secondary | ICD-10-CM | POA: Diagnosis not present

## 2018-04-11 DIAGNOSIS — E785 Hyperlipidemia, unspecified: Secondary | ICD-10-CM | POA: Insufficient documentation

## 2018-04-11 DIAGNOSIS — Z955 Presence of coronary angioplasty implant and graft: Secondary | ICD-10-CM | POA: Diagnosis not present

## 2018-04-11 DIAGNOSIS — I10 Essential (primary) hypertension: Secondary | ICD-10-CM | POA: Diagnosis not present

## 2018-04-11 DIAGNOSIS — E119 Type 2 diabetes mellitus without complications: Secondary | ICD-10-CM | POA: Insufficient documentation

## 2018-04-11 DIAGNOSIS — Z87891 Personal history of nicotine dependence: Secondary | ICD-10-CM | POA: Insufficient documentation

## 2018-04-11 LAB — COMPREHENSIVE METABOLIC PANEL
ALBUMIN: 3.9 g/dL (ref 3.5–5.0)
ALK PHOS: 70 U/L (ref 38–126)
ALT: 17 U/L (ref 0–44)
ANION GAP: 9 (ref 5–15)
AST: 26 U/L (ref 15–41)
BUN: 21 mg/dL (ref 8–23)
CALCIUM: 9.1 mg/dL (ref 8.9–10.3)
CO2: 23 mmol/L (ref 22–32)
Chloride: 106 mmol/L (ref 98–111)
Creatinine, Ser: 1.14 mg/dL (ref 0.61–1.24)
GFR calc Af Amer: >60 mL/min (ref >60)
GFR calc non Af Amer: >60 mL/min (ref >60)
GLUCOSE: 128 mg/dL — AB (ref 70–99)
Potassium: 3.8 mmol/L (ref 3.5–5.1)
SODIUM: 138 mmol/L (ref 135–145)
Total Bilirubin: 0.9 mg/dL (ref 0.3–1.2)
Total Protein: 7 g/dL (ref 6.5–8.1)

## 2018-04-11 LAB — CBC
HCT: 41.7 % (ref 39.0–52.0)
HEMOGLOBIN: 13.5 g/dL (ref 13.0–17.0)
MCH: 29.2 pg (ref 26.0–34.0)
MCHC: 32.4 g/dL (ref 30.0–36.0)
MCV: 90.1 fL (ref 80.0–100.0)
Platelets: 288 10*3/uL (ref 150–400)
RBC: 4.63 MIL/uL (ref 4.22–5.81)
RDW: 12.6 % (ref 11.5–15.5)
WBC: 6.3 10*3/uL (ref 4.0–10.5)
nRBC: 0 % (ref 0.0–0.2)

## 2018-04-11 LAB — SURGICAL PCR SCREEN
MRSA, PCR: NEGATIVE
Staphylococcus aureus: NEGATIVE

## 2018-04-11 NOTE — Progress Notes (Signed)
PCP - [name] MD Cardiologist - Dr. end  Chest x-ray - 01/14/18 EKG - 01/03/18 Stress Test - 2019 ECHO - 2019 Cardiac Cath - 2007   Sleep Study - 2019 CPAP - waiting for results to come back  Blood Thinner Instructions: Calling Duda's office for instructions Aspirin Instructions: Calling Duda's office for instructions  Anesthesia review: yes, Hx CAD  Patient denies shortness of breath, fever, cough and chest pain at PAT appointment   Patient verbalized understanding of instructions that were given to them at the PAT appointment. Patient was also instructed that they will need to review over the PAT instructions again at home before surgery.

## 2018-04-11 NOTE — Telephone Encounter (Signed)
Yes stop blood thinner now for surgery.

## 2018-04-11 NOTE — Telephone Encounter (Signed)
This pt is going to have a total hip. Do you want him to stop his blood thinner?

## 2018-04-11 NOTE — Telephone Encounter (Signed)
Patient wanted to know what day to stop taking blood thinners.  Surgery is 04/17/18  Please call to advise asap,  [phone]

## 2018-04-11 NOTE — Telephone Encounter (Signed)
I called and sw pt to advise per Dr. Sharol Given to stop blood thinner now in prep for surgery.

## 2018-04-12 NOTE — Anesthesia Preprocedure Evaluation (Addendum)
Anesthesia Evaluation  Patient identified by MRN, date of birth, ID band Patient awake    Reviewed: Allergy & Precautions, NPO status , Patient's Chart, lab work & pertinent test results  History of Anesthesia Complications Negative for: history of anesthetic complications  Airway Mallampati: II  TM Distance: >3 FB Neck ROM: Full    Dental  (+) Dental Advisory Given, Partial Upper, Partial Lower   Pulmonary sleep apnea (noncompliant) , former smoker,    breath sounds clear to auscultation       Cardiovascular hypertension, Pt. on medications + CAD, + Past MI, + Cardiac Stents and + Peripheral Vascular Disease   Rhythm:Regular Rate:Normal   '19 Myoperfusion - Nuclear stress EF: 47%. There was no ST segment deviation noted during stress. No T wave inversion was noted during stress. This is an intermediate risk study due to reduced systolic function. There is no ischemia. The left ventricular ejection fraction is mildly decreased (45-54%).  '19 TTE -  EF 55% to 60%. Grade 1 diastolic dysfunction. Trivial MR, TR. PASP was within the normal range.     Neuro/Psych  Headaches, PSYCHIATRIC DISORDERS Anxiety Depression  Previous suicide attempt  Gait and balance disorder   Neuromuscular disease (Left sciatica)    GI/Hepatic GERD  Controlled,(+) Hepatitis -, C  Endo/Other  diabetes, Type 2Morbid obesity  Renal/GU Renal disease    Urethral stricture     Musculoskeletal  (+) Arthritis , Fibromyalgia -  Abdominal   Peds  Hematology negative hematology ROS (+)   Anesthesia Other Findings   Reproductive/Obstetrics                          Anesthesia Physical Anesthesia Plan  ASA: III  Anesthesia Plan: General   Post-op Pain Management:    Induction: Intravenous  PONV Risk Score and Plan: 2 and Ondansetron, Treatment may vary due to age or medical condition, Dexamethasone and  Midazolam  Airway Management Planned: Oral ETT  Additional Equipment: None  Intra-op Plan:   Post-operative Plan: Extubation in OR  Informed Consent: I have reviewed the patients History and Physical, chart, labs and discussed the procedure including the risks, benefits and alternatives for the proposed anesthesia with the patient or authorized representative who has indicated his/her understanding and acceptance.   Dental advisory given  Plan Discussed with: CRNA and Anesthesiologist  Anesthesia Plan Comments:       Anesthesia Quick Evaluation

## 2018-04-12 NOTE — Progress Notes (Signed)
Anesthesia Chart Review:  Case:  [id number] Date/Time:  04/17/18 0715   Procedure:  RIGHT TOTAL HIP ARTHROPLASTY (Right )   Anesthesia type:  General   Pre-op diagnosis:  Osteoarthritis Right Hip   Location:  [facility] OR ROOM 03 / [place] [facility]   Surgeon:  Newt Minion, MD      DISCUSSION: 67 yo male former smoker. Pertinent hx includes Chronic pain on chronic narcotics,  HTN, HLD, DMII, GERD, CAD (per PCP notes, 5 stents placed in 2007), OSA not on CPAP, depression/bipolar disorder (cogentin for bipolar), S/P right BKA for Charcot foot and diabetes.  Cardiac clearance by Daune Perch, NP 02/28/2018. Per her note: "According to the RCRI tool, the patient is a class II risk with 0.9% risk of major cardiac event perioperatively which is average risk. His risk is a little higher due to his decreased functional capacity, partly due to physical limitations and partly due to dyspnea. Recent echo showed normal LV function. I have ordered a lexiscan myoview. If this is normal he can proceed with surgery. HisPrasugrelcan be held 5- 7 days prior to the procedures and restarted at 5 mg daily once it is felt safe to do so. Aspirin 81 mg daily should be continued in the periprocedural period."  Lexiscan 03/26/2018: This is an intermediate risk study due to reduced systolic function. There is no ischemia. Nuclear stress EF: 47%. Note by Daune Perch "No ischemia noted. Normal EF by echo. OK to proceed with surgery."  History of +PPD. Per Allyn Kenner note 05/10/2015 "I spoke with Dr. Tommy Medal who said that since patient had a negative chest xray that no additional precautions that would be needed if patient was asymptomatic." Pt has since had numerous surgeries.   Anticipate he can proceed as planned barring acute status change.  VS: BP (!) 141/76   Pulse 82   Temp 36.7 C   Resp 20   Ht 6' 1"  (1.854 m)   Wt (!) 150 kg   SpO2 97%   BMI 43.62 kg/m   PROVIDERS: [name], MD is PCP  End, Harrell Gave,  MD is Cardiologist  LABS: Labs reviewed: Acceptable for surgery. (all labs ordered are listed, but only abnormal results are displayed)  Labs Reviewed  COMPREHENSIVE METABOLIC PANEL - Abnormal; Notable for the following components:      Result Value   Glucose, Bld 128 (*)    All other components within normal limits  SURGICAL PCR SCREEN  CBC     IMAGES: CHEST - 2 VIEW 01/14/2018:  COMPARISON:  Radiographs of September 02, 2015.  FINDINGS: The heart size and mediastinal contours are within normal limits. Both lungs are clear. The visualized skeletal structures are unremarkable.  IMPRESSION: No active cardiopulmonary disease.  EKG: 01/03/2018: NSR. Rate 64.  CV: Lexiscan 03/26/2018:  Nuclear stress EF: 47%.  There was no ST segment deviation noted during stress.  No T wave inversion was noted during stress.  This is an intermediate risk study due to reduced systolic function. There is no ischemia.  The left ventricular ejection fraction is mildly decreased (45-54%).   TTE 01/09/2018: Study Conclusions  - Left ventricle: The cavity size was normal. Systolic function was   normal. The estimated ejection fraction was in the range of 55%   to 60%. Wall motion was normal; there were no regional wall   motion abnormalities. Doppler parameters are consistent with   abnormal left ventricular relaxation (grade 1 diastolic   dysfunction). - Aortic valve:  Transvalvular velocity was within the normal range.   There was no stenosis. There was no regurgitation. - Mitral valve: Transvalvular velocity was within the normal range.   There was no evidence for stenosis. There was trivial   regurgitation. - Right ventricle: The cavity size was normal. Wall thickness was   normal. Systolic function was normal. - Tricuspid valve: There was trivial regurgitation. - Pulmonary arteries: Systolic pressure was within the normal   range. PA peak pressure: 24 mm Hg (S).  Past Medical  History:  Diagnosis Date  . ALLERGIC RHINITIS 06/24/2009  . Anxiety 11/12/2011   Adequate for discharge   . Arthritis    "all my joints" (09/30/2013)  . Arthritis of foot, right, degenerative 04/15/2014  . Balance disorder 03/12/2013  . Benign neoplasm of cecum   . Benign neoplasm of descending colon   . CAD (coronary artery disease) 06/24/2009   5 stents placed in 2007    . Chronic anticoagulation   . Chronic pain syndrome 10/27/2009   of ankle, shoulders, low back.  sciatica.   . Closed fracture of right foot 10/17/2014  . CORONARY ARTERY DISEASE 06/24/2009   a. s/p multiple PCIs - In 2008 he had a Taxus DES to the mild LAD, Endeavor DES to mid LCX and distal LCX. In January 2009 he had DES to distal LCX, mid LCX and proximal LCX. In November 2009 had BMS x 2 to the mid RCA. Cath 10/2011 with patent stents, noncardiac CP. LHC 01/2013: patent stents (noncardiac CP).  . DEGENERATIVE JOINT DISEASE 06/24/2009   Qualifier: Diagnosis of  By: [name], [name]   . Depression   . Depression with anxiety    Prior suicide attempt  . [place] DISEASE, LUMBAR 04/19/2010  . ERECTILE DYSFUNCTION, ORGANIC 05/30/2010  . Essential hypertension 06/24/2009   Qualifier: Diagnosis of  By: [name], [name]   . Fibromyalgia   . Fracture dislocation of ankle joint 09/02/2015  . Gait disorder 03/12/2013  . General weakness 07/14/2014  . GERD (gastroesophageal reflux disease) 09/08/2015  . Hand joint pain 06/10/2013  . Heart murmur   . Hepatitis C   . History of kidney stones   . Hyperlipidemia 07/15/2009   Qualifier: Diagnosis of  By: Aundra Dubin, MD, [place]    . HYPERLIPIDEMIA-MIXED 07/15/2009  . HYPERTENSION 06/24/2009  . Insomnia 10/04/2011  . Irregular heart beat   . Left hip pain 03/12/2013   Injected under ultrasound guidance on June 24, 2013   . Major depression 09/13/2015  . Myocardial infarction ([place]) 2008  . Non-cardiac chest pain 10/2011, 01/2013  . Obesity   . Occult blood positive stool 10/17/2014  . Open ankle  fracture 09/02/2015  . OSA (obstructive sleep apnea)    not using CPAP (09/30/2013)  . Pain of right thumb 04/03/2013  . Pneumonia   . PPD positive 04/08/2015  . Pre-ulcerative corn or callous 02/06/2013  . Rotator cuff tear arthropathy of both shoulders 06/10/2013   History of bilateral shoulder cuff surgery for rotator cuff tears. Reports increase in pain 09/11/2015 during physical therapy of the left shoulder.   [name] SCIATICA, LEFT 04/19/2010   Qualifier: Diagnosis of  By: [name], [name]   . Spinal stenosis in cervical region 09/26/2013  . Spinal stenosis, lumbar region, with neurogenic claudication 09/26/2013  . Type II diabetes mellitus ([place]) 2012   no meds in 09/2014.   [name] Uncontrolled type 2 DM with peripheral circulatory disorder ([place]) 10/04/2013  . URETHRAL STRICTURE 06/24/2009  self catheterizes.     Past Surgical History:  Procedure Laterality Date  . AMPUTATION Right 06/14/2016   Procedure: AMPUTATION BELOW KNEE;  Surgeon: Newt Minion, MD;  Location: [place];  Service: Orthopedics;  Laterality: Right;  . ANKLE FUSION Right 04/15/2014   Procedure: Right Subtalar, Talonavicular Fusion;  Surgeon: Newt Minion, MD;  Location: [place];  Service: Orthopedics;  Laterality: Right;  . ANKLE FUSION Right 04/18/2016   Right Ankle Tibiocalcaneal Fusion; Removal of deep retained hardware; application of wound vac/notes 04/18/2016  . ANKLE FUSION Right 04/18/2016   Procedure: Right Ankle Tibiocalcaneal Fusion;  Surgeon: Newt Minion, MD;  Location: [place];  Service: Orthopedics;  Laterality: Right;  . ANTERIOR CERVICAL DECOMP/DISCECTOMY FUSION N/A 09/26/2013   Procedure: ANTERIOR CERVICAL DISCECTOMY FUSION C3-4, plate and screw fixation, allograft bone graft;  Surgeon: Jessy Oto, MD;  Location: [place];  Service: Orthopedics;  Laterality: N/A;  . BACK SURGERY    . BELOW KNEE LEG AMPUTATION Right 06/14/2016  . CARDIAC CATHETERIZATION  X 1  . CARPAL TUNNEL RELEASE Bilateral   . COLONOSCOPY    .  COLONOSCOPY N/A 10/22/2014   Procedure: COLONOSCOPY;  Surgeon: Lafayette Dragon, MD;  [facility];  Service: Endoscopy;  Laterality: N/A;  . CORONARY ANGIOPLASTY WITH STENT PLACEMENT     "I have 9 stents"  . ESOPHAGOGASTRODUODENOSCOPY N/A 10/19/2014   Procedure: ESOPHAGOGASTRODUODENOSCOPY (EGD);  Surgeon: Jerene Bears, MD;  Location: [facility] ENDOSCOPY;  Service: Endoscopy;  Laterality: N/A;  . FUSION OF TALONAVICULAR JOINT Right 04/15/2014   dr duda  . HERNIA REPAIR     umbilical  . INGUINAL HERNIA REPAIR Right 05/11/2015   Procedure: LAPAROSCOPIC REPAIR RIGHT  INGUINAL HERNIA;  Surgeon: Greer Pickerel, MD;  Location: [place];  Service: General;  Laterality: Right;  . INSERTION OF MESH Right 05/11/2015   Procedure: INSERTION OF MESH;  Surgeon: Greer Pickerel, MD;  Location: [place];  Service: General;  Laterality: Right;  . JOINT REPLACEMENT Bilateral    knees  . KNEE CARTILAGE SURGERY Right X 12   "~ 1/2 open; ~ 1/2 scopes"  . KNEE CARTILAGE SURGERY Left X 3   "3 scopes"  . LEFT HEART CATHETERIZATION WITH CORONARY ANGIOGRAM N/A 02/10/2013   Procedure: LEFT HEART CATHETERIZATION WITH CORONARY ANGIOGRAM;  Surgeon: Burnell Blanks, MD;  Location: [facility] CATH LAB;  Service: Cardiovascular;  Laterality: N/A;  . LUMBAR LAMINECTOMY/DECOMPRESSION MICRODISCECTOMY N/A 01/27/2014   Procedure: CENTRAL LUMBAR LAMINECTOMY L4-5 AND L3-4;  Surgeon: Jessy Oto, MD;  Location: [place];  Service: Orthopedics;  Laterality: N/A;  . ORIF ANKLE FRACTURE Right 09/02/2015   Procedure: OPEN REDUCTION INTERNAL FIXATION (ORIF) ANKLE FRACTURE;  Surgeon: Newt Minion, MD;  Location: [place];  Service: Orthopedics;  Laterality: Right;  . PERIPHERALLY INSERTED CENTRAL CATHETER INSERTION  09/02/2015  . SHOULDER ARTHROSCOPY W/ ROTATOR CUFF REPAIR Bilateral    "3 on the right; 1 on the left"  . SKIN SPLIT GRAFT Right 10/01/2015   Procedure: RIGHT ANKLE APPLY SKIN GRAFT SPLIT THICKNESS;  Surgeon: Newt Minion, MD;  Location: [place];   Service: Orthopedics;  Laterality: Right;  . TONSILLECTOMY    . TOOTH EXTRACTION    . TOTAL KNEE ARTHROPLASTY Bilateral 2008  . UMBILICAL HERNIA REPAIR     UHR  . URETHRAL DILATION  X 4  . VASECTOMY    . WISDOM TOOTH EXTRACTION      MEDICATIONS: . gabapentin (NEURONTIN) 800 MG tablet  . ARIPiprazole (  ABILIFY) 5 MG tablet  . aspirin 81 MG tablet  . atorvastatin (LIPITOR) 20 MG tablet  . benztropine (COGENTIN) 0.5 MG tablet  . clonazePAM (KLONOPIN) 0.5 MG tablet  . cyclobenzaprine (FLEXERIL) 5 MG tablet  . DULoxetine (CYMBALTA) 30 MG capsule  . hydrochlorothiazide (HYDRODIURIL) 25 MG tablet  . HYDROcodone-acetaminophen (NORCO) 10-325 MG tablet  . Melatonin 5 MG TABS  . mirabegron ER (MYRBETRIQ) 50 MG TB24 tablet  . Omega-3 Fatty Acids (FISH OIL) 1000 MG CAPS  . pantoprazole (PROTONIX) 40 MG tablet  . prasugrel (EFFIENT) 5 MG TABS tablet  . pregabalin (LYRICA) 150 MG capsule  . traZODone (DESYREL) 150 MG tablet   No current facility-administered medications for this encounter.     [name] [facility] Stay [facility]/Anesthesiology Phone [phone] 04/12/2018 10:13 AM

## 2018-04-15 ENCOUNTER — Ambulatory Visit (INDEPENDENT_AMBULATORY_CARE_PROVIDER_SITE_OTHER): Payer: Medicare HMO

## 2018-04-15 ENCOUNTER — Encounter (INDEPENDENT_AMBULATORY_CARE_PROVIDER_SITE_OTHER): Payer: Self-pay | Admitting: Physician Assistant

## 2018-04-15 ENCOUNTER — Encounter: Payer: Medicare HMO | Admitting: Skilled Nursing Facility1

## 2018-04-15 ENCOUNTER — Ambulatory Visit (INDEPENDENT_AMBULATORY_CARE_PROVIDER_SITE_OTHER): Payer: Medicare HMO | Admitting: Physician Assistant

## 2018-04-15 ENCOUNTER — Telehealth (INDEPENDENT_AMBULATORY_CARE_PROVIDER_SITE_OTHER): Payer: Self-pay

## 2018-04-15 ENCOUNTER — Other Ambulatory Visit: Payer: Self-pay | Admitting: Internal Medicine

## 2018-04-15 ENCOUNTER — Encounter: Payer: Self-pay | Admitting: Skilled Nursing Facility1

## 2018-04-15 VITALS — Ht 72.0 in | Wt 324.0 lb

## 2018-04-15 DIAGNOSIS — Z4789 Encounter for other orthopedic aftercare: Secondary | ICD-10-CM | POA: Diagnosis not present

## 2018-04-15 DIAGNOSIS — Z79899 Other long term (current) drug therapy: Secondary | ICD-10-CM

## 2018-04-15 DIAGNOSIS — G4733 Obstructive sleep apnea (adult) (pediatric): Secondary | ICD-10-CM | POA: Diagnosis present

## 2018-04-15 DIAGNOSIS — Z7902 Long term (current) use of antithrombotics/antiplatelets: Secondary | ICD-10-CM | POA: Insufficient documentation

## 2018-04-15 DIAGNOSIS — M25551 Pain in right hip: Secondary | ICD-10-CM | POA: Diagnosis not present

## 2018-04-15 DIAGNOSIS — I10 Essential (primary) hypertension: Secondary | ICD-10-CM

## 2018-04-15 DIAGNOSIS — M1611 Unilateral primary osteoarthritis, right hip: Secondary | ICD-10-CM | POA: Diagnosis present

## 2018-04-15 DIAGNOSIS — Z981 Arthrodesis status: Secondary | ICD-10-CM | POA: Diagnosis not present

## 2018-04-15 DIAGNOSIS — E1165 Type 2 diabetes mellitus with hyperglycemia: Secondary | ICD-10-CM | POA: Diagnosis not present

## 2018-04-15 DIAGNOSIS — R21 Rash and other nonspecific skin eruption: Secondary | ICD-10-CM | POA: Diagnosis present

## 2018-04-15 DIAGNOSIS — K219 Gastro-esophageal reflux disease without esophagitis: Secondary | ICD-10-CM | POA: Diagnosis present

## 2018-04-15 DIAGNOSIS — Z713 Dietary counseling and surveillance: Secondary | ICD-10-CM | POA: Insufficient documentation

## 2018-04-15 DIAGNOSIS — M19071 Primary osteoarthritis, right ankle and foot: Secondary | ICD-10-CM | POA: Diagnosis present

## 2018-04-15 DIAGNOSIS — Z96653 Presence of artificial knee joint, bilateral: Secondary | ICD-10-CM | POA: Diagnosis present

## 2018-04-15 DIAGNOSIS — Z89511 Acquired absence of right leg below knee: Secondary | ICD-10-CM

## 2018-04-15 DIAGNOSIS — R7611 Nonspecific reaction to tuberculin skin test without active tuberculosis: Secondary | ICD-10-CM | POA: Diagnosis present

## 2018-04-15 DIAGNOSIS — M160 Bilateral primary osteoarthritis of hip: Secondary | ICD-10-CM | POA: Insufficient documentation

## 2018-04-15 DIAGNOSIS — F319 Bipolar disorder, unspecified: Secondary | ICD-10-CM

## 2018-04-15 DIAGNOSIS — Z955 Presence of coronary angioplasty implant and graft: Secondary | ICD-10-CM | POA: Insufficient documentation

## 2018-04-15 DIAGNOSIS — I5023 Acute on chronic systolic (congestive) heart failure: Secondary | ICD-10-CM | POA: Diagnosis not present

## 2018-04-15 DIAGNOSIS — Z202 Contact with and (suspected) exposure to infections with a predominantly sexual mode of transmission: Secondary | ICD-10-CM | POA: Diagnosis present

## 2018-04-15 DIAGNOSIS — I5022 Chronic systolic (congestive) heart failure: Secondary | ICD-10-CM | POA: Diagnosis present

## 2018-04-15 DIAGNOSIS — Z96641 Presence of right artificial hip joint: Secondary | ICD-10-CM | POA: Diagnosis not present

## 2018-04-15 DIAGNOSIS — Z972 Presence of dental prosthetic device (complete) (partial): Secondary | ICD-10-CM | POA: Diagnosis not present

## 2018-04-15 DIAGNOSIS — I252 Old myocardial infarction: Secondary | ICD-10-CM | POA: Diagnosis not present

## 2018-04-15 DIAGNOSIS — E785 Hyperlipidemia, unspecified: Secondary | ICD-10-CM | POA: Diagnosis present

## 2018-04-15 DIAGNOSIS — Z6841 Body Mass Index (BMI) 40.0 and over, adult: Secondary | ICD-10-CM | POA: Diagnosis not present

## 2018-04-15 DIAGNOSIS — I2510 Atherosclerotic heart disease of native coronary artery without angina pectoris: Secondary | ICD-10-CM

## 2018-04-15 DIAGNOSIS — Z7401 Bed confinement status: Secondary | ICD-10-CM | POA: Diagnosis not present

## 2018-04-15 DIAGNOSIS — G894 Chronic pain syndrome: Secondary | ICD-10-CM | POA: Insufficient documentation

## 2018-04-15 DIAGNOSIS — M797 Fibromyalgia: Secondary | ICD-10-CM | POA: Diagnosis present

## 2018-04-15 DIAGNOSIS — K08409 Partial loss of teeth, unspecified cause, unspecified class: Secondary | ICD-10-CM | POA: Diagnosis present

## 2018-04-15 DIAGNOSIS — M2550 Pain in unspecified joint: Secondary | ICD-10-CM | POA: Diagnosis not present

## 2018-04-15 DIAGNOSIS — I110 Hypertensive heart disease with heart failure: Secondary | ICD-10-CM | POA: Diagnosis present

## 2018-04-15 DIAGNOSIS — E119 Type 2 diabetes mellitus without complications: Secondary | ICD-10-CM

## 2018-04-15 DIAGNOSIS — Z87442 Personal history of urinary calculi: Secondary | ICD-10-CM | POA: Diagnosis not present

## 2018-04-15 DIAGNOSIS — E669 Obesity, unspecified: Secondary | ICD-10-CM

## 2018-04-15 DIAGNOSIS — Z915 Personal history of self-harm: Secondary | ICD-10-CM | POA: Diagnosis not present

## 2018-04-15 DIAGNOSIS — D62 Acute posthemorrhagic anemia: Secondary | ICD-10-CM | POA: Diagnosis not present

## 2018-04-15 DIAGNOSIS — R5381 Other malaise: Secondary | ICD-10-CM | POA: Diagnosis not present

## 2018-04-15 DIAGNOSIS — Z9852 Vasectomy status: Secondary | ICD-10-CM | POA: Diagnosis not present

## 2018-04-15 DIAGNOSIS — R6889 Other general symptoms and signs: Secondary | ICD-10-CM | POA: Diagnosis not present

## 2018-04-15 DIAGNOSIS — E11610 Type 2 diabetes mellitus with diabetic neuropathic arthropathy: Secondary | ICD-10-CM | POA: Diagnosis present

## 2018-04-15 DIAGNOSIS — I739 Peripheral vascular disease, unspecified: Secondary | ICD-10-CM | POA: Diagnosis not present

## 2018-04-15 NOTE — Telephone Encounter (Signed)
pts Rt THA that is scheduled for 04/17/18 is pending denial from [facility]. They are stating clinical that was sent does not support the need for the surgery. Imaging report says mild OA.  They are offering peer to peer review. This would need to be done by calling [phone].Or another option is sending in more recent imaging report showing more that mild OA.

## 2018-04-15 NOTE — Progress Notes (Signed)
Sleeve Assessment:  3rd  SWL Appointment.   According to pts referral the pt needs 6 SWL with 2 assessments. Pt state he works with a professional for depression talk therapy and medication management.   Pt does have right below the knee amputation.  Pt states he has a goal of 2000 calories. Pt states working with his phycologist has been helpful. Pt states he has been working on hunger verses appetite.Pt does get meals on wheels.   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 Pt states to control his emotional eating he walk to the steps or bedside exercises.  [place] in [place] in his hip and back. Pt states he is getting hip replacement this week. Pt states his sons wedding was beautiful. Pt states he has been under his calorie gaol daily. Pt states he had Thanksgiving yesterday due to his upcoming surgery.  Pt states he is using his competitive side to help him make changes. Pt state she has been successful with having non starchy vegetables with dinner. Pt states he has his dentures which need some adjustments but it has allowed him to chew until applesauce consistency.    Start weight at [facility]: 317.8 (this weight is with his prosthesis) Weight: 325 (this weight is with his prosthesis) BMI: 47.82  MEDICATIONS: see list   DIETARY INTAKE:  24 hr Dietary Recall: 3 meals a day eating every 3-5 hours First Meal: 2 bacon and 1 eggs and 2 pieces of toast  Snack: milk and kellog bar Second Meal: meals on wheels   Third Meal: 5 dollar pizza from [employer] or hamburger or hotdog or chicken with man n cheese and asparagus  Snack 9pm: popcorn Beverages: 4 liters a day of water, 1% milk  Usual physical activity: ADL's due to pain  Diet to Follow: 2000 calories 225 g carbohydrates 150 g protein 56 g fat   Nutritional Diagnosis:  [place]-3.3 Overweight/obesity related to past poor dietary habits and physical inactivity as evidenced by patient w/ planned Sleeve surgery following dietary guidelines  for continued weight loss.    Intervention:  Nutrition counseling for upcoming Bariatric Surgery. Goals: -Encouraged to engage in 150 minutes of moderate physical activity including cardiovascular and weight baring weekly -Continue to make those great changes! -Work on keeping fat in the single digits -Focus on healing after your hip surgery with at least 4-5 meals/snacks a day and 80-100 fluid ounces   Teaching Method Utilized:  Visual Auditory Hands on  Handouts given during visit include:  Pre-op goals  Barriers to learning/adherence to lifestyle change: emotional eating  Demonstrated degree of understanding via:  Teach Back   Monitoring/Evaluation:  Dietary intake, exercise,and body weight prn.

## 2018-04-15 NOTE — Progress Notes (Signed)
Office Visit Note   Patient: Travis SCIANDRA Sr.           Date of Birth: [birth year]           MRN: [id number] Visit Date: 04/15/2018              Requested by: [name], MD [place], [place] [address] PCP: [name], MD  Chief Complaint  Patient presents with  . Right Hip - Pain      HPI: Patient presents in follow-up for the osteoarthritis of his right hip.  Patient complains of increasing pain difficulty with ambulation he uses a cane.  Assessment & Plan: Visit Diagnoses:  1. Pain in right hip   2. Unilateral primary osteoarthritis, right hip     Plan: With the progressive arthritis have discussed recommendations to proceed with a total hip arthroplasty.  Patient wishes to proceed at this time risks and benefits were discussed including risk of dislocation.  Follow-Up Instructions: Return if symptoms worsen or fail to improve.   Ortho Exam  Patient is alert, oriented, no adenopathy, well-dressed, normal affect, normal respiratory effort. Examination patient has an antalgic gait he uses a cane he has an abductor lurch and swings his body weight over the right side.  He has pain with range of motion of the hip with decreased range of motion with 20 degrees of internal and external rotation.  Imaging: Xr Hip Unilat W Or W/o Pelvis 2-3 Views Right  Result Date: 04/15/2018 2 view radiographs of the right hip shows progressive arthritis patient has bone-on-bone contact with subchondral sclerosis there is a large bone spur inferiorly on the acetabulum and femoral head.  Patient has an elongated femoral head from the osteoarthritis.  No images are attached to the encounter.  Labs: Lab Results  Component Value Date   HGBA1C 5.5 01/10/2018   HGBA1C 5.4 07/13/2017   HGBA1C 5.7 03/01/2017   ESRSEDRATE 20 (H) 09/03/2013   ESRSEDRATE 11 03/25/2013   CRP 10.7 (H) 09/03/2013   REPTSTATUS 01/30/2014 FINAL 01/29/2014   GRAMSTAIN  09/02/2013   WBC PRESENT, PREDOMINANTLY MONONUCLEAR NO ORGANISMS SEEN CYTOSPIN Performed by [facility] Performed at [place]  09/02/2013    NO ORGANISMS SEEN WBC PRESENT, PREDOMINANTLY MONONUCLEAR CYTOSPIN Gram Stain Report Called to,Read Back By and Verified With: A LEDWELL RN 1931 09/02/13 A NAVARRO   CULT NO GROWTH Performed at [facility] 01/29/2014   LABORGA STAPHYLOCOCCUS SPECIES (COAGULASE NEGATIVE) 04/08/2015     Lab Results  Component Value Date   ALBUMIN 3.9 04/11/2018   ALBUMIN 4.6 01/10/2018   ALBUMIN 4.7 07/13/2017    Body mass index is 43.94 kg/m.  Orders:  Orders Placed This Encounter  Procedures  . XR HIP UNILAT W OR W/O PELVIS 2-3 VIEWS RIGHT   No orders of the defined types were placed in this encounter.    Procedures: No procedures performed  Clinical Data: No additional findings.  ROS:  All other systems negative, except as noted in the HPI. Review of Systems  Objective: Vital Signs: Ht 6' (1.829 m)   Wt (!) 324 lb (147 kg)   BMI 43.94 kg/m   Specialty Comments:  No specialty comments available.  PMFS History: Patient Active Problem List   Diagnosis Date Noted  . Morbid obesity with BMI of 40.0-44.9, adult ([place]) 02/28/2018  . Myocardial infarction ([place]) 02/25/2018  . Coronary artery disease involving native coronary artery  of native heart 02/25/2018  . PAD (peripheral artery disease) ([place]) 02/25/2018  . S/P BKA (below knee amputation) unilateral, right ([place]) 02/25/2018  . Sacroiliitis ([place]) 02/25/2018  . SOB (shortness of breath) 01/03/2018  . Acute on chronic heart failure ([place]) 01/03/2018  . Severe right groin pain 12/20/2017  . Morbid obesity ([place]) 10/09/2017  . Exposure to sexually transmitted disease (STD) 07/14/2017  . Low back pain 07/13/2017  . Leukoplakia, tongue 01/26/2017  . Acute sinus infection 10/20/2016  . Hyperglycemia 10/20/2016  . Skin lesion 10/20/2016  . S/P unilateral BKA  (below knee amputation), right ([place]) 06/14/2016  . Charcot foot due to diabetes mellitus ([place])   . Charcot's arthropathy associated with type 2 diabetes mellitus ([place]) 04/11/2016  . Preventative health care 11/05/2015  . Major depression 09/13/2015  . S/P TKR (total knee replacement) bilaterally 09/13/2015  . GERD (gastroesophageal reflux disease) 09/08/2015  . S/P laparoscopic hernia repair 05/11/2015  . PPD positive 04/08/2015  . Benign neoplasm of descending colon   . Benign neoplasm of cecum   . AKI (acute kidney injury) ([place]) 10/18/2014  . Acute blood loss anemia   . Chronic anticoagulation   . Occult blood positive stool 10/17/2014  . General weakness 07/14/2014  . Urinary incontinence 07/14/2014  . Headache(784.0) 10/15/2013  . Spinal stenosis in cervical region 09/26/2013    Class: Chronic  . Spinal stenosis, lumbar region, with neurogenic claudication 09/26/2013    Class: Chronic  . Hand joint pain 06/10/2013  . Rotator cuff tear arthropathy of both shoulders 06/10/2013  . Skin lesion of cheek 05/01/2013  . Pain of right thumb 04/03/2013  . Balance disorder 03/12/2013  . Gait disorder 03/12/2013  . Tremor 03/12/2013  . Left hip pain 03/12/2013  . Pre-ulcerative corn or callous 02/06/2013  . Anxiety 11/12/2011  . OSA (obstructive sleep apnea) 11/07/2011  . Bradycardia 10/20/2011  . Insomnia 10/04/2011  . Obesity 01/12/2011  . ERECTILE DYSFUNCTION, ORGANIC 05/30/2010  . [place] DISEASE, LUMBAR 04/19/2010  . SCIATICA, LEFT 04/19/2010  . Chronic pain syndrome 10/27/2009  . Hyperlipidemia 07/15/2009  . Essential hypertension 06/24/2009  . Coronary artery disease involving native coronary artery of native heart without angina pectoris 06/24/2009  . Allergic rhinitis 06/24/2009  . URETHRAL STRICTURE 06/24/2009  . DEGENERATIVE JOINT DISEASE 06/24/2009  . SHOULDER PAIN, BILATERAL 06/24/2009  . FATIGUE 06/24/2009  . NEPHROLITHIASIS, HX OF 06/24/2009   Past Medical  History:  Diagnosis Date  . ALLERGIC RHINITIS 06/24/2009  . Anxiety 11/12/2011   Adequate for discharge   . Arthritis    "all my joints" (09/30/2013)  . Arthritis of foot, right, degenerative 04/15/2014  . Balance disorder 03/12/2013  . Benign neoplasm of cecum   . Benign neoplasm of descending colon   . CAD (coronary artery disease) 06/24/2009   5 stents placed in 2007    . Chronic anticoagulation   . Chronic pain syndrome 10/27/2009   of ankle, shoulders, low back.  sciatica.   . Closed fracture of right foot 10/17/2014  . CORONARY ARTERY DISEASE 06/24/2009   a. s/p multiple PCIs - In 2008 he had a Taxus DES to the mild LAD, Endeavor DES to mid LCX and distal LCX. In January 2009 he had DES to distal LCX, mid LCX and proximal LCX. In November 2009 had BMS x 2 to the mid RCA. Cath 10/2011 with patent stents, noncardiac CP. LHC 01/2013: patent stents (noncardiac CP).  . DEGENERATIVE JOINT DISEASE 06/24/2009   Qualifier: Diagnosis of  By: Jenny Reichmann  MD, [name]   . Depression   . Depression with anxiety    Prior suicide attempt  . [place] DISEASE, LUMBAR 04/19/2010  . ERECTILE DYSFUNCTION, ORGANIC 05/30/2010  . Essential hypertension 06/24/2009   Qualifier: Diagnosis of  By: [name], [name]   . Fibromyalgia   . Fracture dislocation of ankle joint 09/02/2015  . Gait disorder 03/12/2013  . General weakness 07/14/2014  . GERD (gastroesophageal reflux disease) 09/08/2015  . Hand joint pain 06/10/2013  . Heart murmur   . Hepatitis C   . History of kidney stones   . Hyperlipidemia 07/15/2009   Qualifier: Diagnosis of  By: Aundra Dubin, MD, [place]    . HYPERLIPIDEMIA-MIXED 07/15/2009  . HYPERTENSION 06/24/2009  . Insomnia 10/04/2011  . Irregular heart beat   . Left hip pain 03/12/2013   Injected under ultrasound guidance on June 24, 2013   . Major depression 09/13/2015  . Myocardial infarction ([place]) 07/30/06  . Non-cardiac chest pain 10/2011, 01/2013  . Obesity   . Occult blood positive stool 10/17/2014  . Open ankle  fracture 09/02/2015  . OSA (obstructive sleep apnea)    not using CPAP (09/30/2013)  . Pain of right thumb 04/03/2013  . Pneumonia   . PPD positive 04/08/2015  . Pre-ulcerative corn or callous 02/06/2013  . Rotator cuff tear arthropathy of both shoulders 06/10/2013   History of bilateral shoulder cuff surgery for rotator cuff tears. Reports increase in pain 09/11/2015 during physical therapy of the left shoulder.   [name] SCIATICA, LEFT 04/19/2010   Qualifier: Diagnosis of  By: [name], [name]   . Spinal stenosis in cervical region 09/26/2013  . Spinal stenosis, lumbar region, with neurogenic claudication 09/26/2013  . Type II diabetes mellitus ([place]) 2010/07/30   no meds in 09/2014.   [name] Uncontrolled type 2 DM with peripheral circulatory disorder ([place]) 10/04/2013  . URETHRAL STRICTURE 06/24/2009   self catheterizes.     Family History  Problem Relation Age of Onset  . Depression Mother   . Heart disease Mother   . Hypertension Mother   . Breast cancer Mother   . Diabetes Father   . Heart disease Father        CABG  . Hypertension Father   . Hyperlipidemia Father   . Prostate cancer Father   . Skin cancer Father   . Depression Brother        x 2  . Hypertension Brother        x2  . Healthy Son   . Heart disease Maternal Grandfather   . Early death Maternal Grandfather   . Heart attack Maternal Grandfather 31-Jul-2063  . Early death Paternal Grandfather   . Coronary artery disease Other   . Hypertension Other   . Depression Other   . Healthy Son     Past Surgical History:  Procedure Laterality Date  . AMPUTATION Right 06/14/2016   Procedure: AMPUTATION BELOW KNEE;  Surgeon: Newt Minion, MD;  Location: [facility];  Service: Orthopedics;  Laterality: Right;  . ANKLE FUSION Right 04/15/2014   Procedure: Right Subtalar, Talonavicular Fusion;  Surgeon: Newt Minion, MD;  Location: [place];  Service: Orthopedics;  Laterality: Right;  . ANKLE FUSION Right 04/18/2016   Right Ankle Tibiocalcaneal Fusion;  Removal of deep retained hardware; application of wound vac/notes 04/18/2016  . ANKLE FUSION Right 04/18/2016   Procedure: Right Ankle Tibiocalcaneal Fusion;  Surgeon: Newt Minion, MD;  Location: [place];  Service: Orthopedics;  Laterality:  Right;  [name] ANTERIOR CERVICAL DECOMP/DISCECTOMY FUSION N/A 09/26/2013   Procedure: ANTERIOR CERVICAL DISCECTOMY FUSION C3-4, plate and screw fixation, allograft bone graft;  Surgeon: Jessy Oto, MD;  Location: [place];  Service: Orthopedics;  Laterality: N/A;  . BACK SURGERY    . BELOW KNEE LEG AMPUTATION Right 06/14/2016  . CARDIAC CATHETERIZATION  X 1  . CARPAL TUNNEL RELEASE Bilateral   . COLONOSCOPY    . COLONOSCOPY N/A 10/22/2014   Procedure: COLONOSCOPY;  Surgeon: Lafayette Dragon, MD;  [facility];  Service: Endoscopy;  Laterality: N/A;  . CORONARY ANGIOPLASTY WITH STENT PLACEMENT     "I have 9 stents"  . ESOPHAGOGASTRODUODENOSCOPY N/A 10/19/2014   Procedure: ESOPHAGOGASTRODUODENOSCOPY (EGD);  Surgeon: Jerene Bears, MD;  Location[facility] ENDOSCOPY;  Service: Endoscopy;  Laterality: N/A;  . FUSION OF TALONAVICULAR JOINT Right 04/15/2014   dr duda  . HERNIA REPAIR     umbilical  . INGUINAL HERNIA REPAIR Right 05/11/2015   Procedure: LAPAROSCOPIC REPAIR RIGHT  INGUINAL HERNIA;  Surgeon: Greer Pickerel, MD;  Location: [place];  Service: General;  Laterality: Right;  . INSERTION OF MESH Right 05/11/2015   Procedure: INSERTION OF MESH;  Surgeon: Greer Pickerel, MD;  Location: [place];  Service: General;  Laterality: Right;  . JOINT REPLACEMENT Bilateral    knees  . KNEE CARTILAGE SURGERY Right X 12   "~ 1/2 open; ~ 1/2 scopes"  . KNEE CARTILAGE SURGERY Left X 3   "3 scopes"  . LEFT HEART CATHETERIZATION WITH CORONARY ANGIOGRAM N/A 02/10/2013   Procedure: LEFT HEART CATHETERIZATION WITH CORONARY ANGIOGRAM;  Surgeon: Burnell Blanks, MD;  Location: [facility] CATH LAB;  Service: Cardiovascular;  Laterality: N/A;  . LUMBAR LAMINECTOMY/DECOMPRESSION MICRODISCECTOMY  N/A 01/27/2014   Procedure: CENTRAL LUMBAR LAMINECTOMY L4-5 AND L3-4;  Surgeon: Jessy Oto, MD;  Location: [place];  Service: Orthopedics;  Laterality: N/A;  . ORIF ANKLE FRACTURE Right 09/02/2015   Procedure: OPEN REDUCTION INTERNAL FIXATION (ORIF) ANKLE FRACTURE;  Surgeon: Newt Minion, MD;  Location: [facility];  Service: Orthopedics;  Laterality: Right;  . PERIPHERALLY INSERTED CENTRAL CATHETER INSERTION  09/02/2015  . SHOULDER ARTHROSCOPY W/ ROTATOR CUFF REPAIR Bilateral    "3 on the right; 1 on the left"  . SKIN SPLIT GRAFT Right 10/01/2015   Procedure: RIGHT ANKLE APPLY SKIN GRAFT SPLIT THICKNESS;  Surgeon: Newt Minion, MD;  Location: [facility] [place];  Service: Orthopedics;  Laterality: Right;  . TONSILLECTOMY    . TOOTH EXTRACTION    . TOTAL KNEE ARTHROPLASTY Bilateral 2008  . UMBILICAL HERNIA REPAIR     UHR  . URETHRAL DILATION  X 4  . VASECTOMY    . WISDOM TOOTH EXTRACTION     Social History   Occupational History  . Occupation: disabled since 2006 due to ortho. heart, psych    Employer: UNEMPLOYED  . Occupation: part time work as an Multimedia programmer, wrestling, and Holiday representative  Tobacco Use  . Smoking status: Former Smoker    Types: Cigars    Last attempt to quit: 08/28/2010    Years since quitting: 7.6  . Smokeless tobacco: Never Used  . Tobacco comment: 04/18/2016 "smoked 1 cigar/wk when I did smoke"  Substance and Sexual Activity  . Alcohol use: No    Alcohol/week: 0.0 standard drinks    Comment: rarely  . Drug use: No  . Sexual activity: Not Currently

## 2018-04-15 NOTE — Telephone Encounter (Signed)
Call patient and will have him follow-up with a new x-ray of the hip.  Let them know that insurance has not approved surgery due to the current x-ray not showing significant arthritis.

## 2018-04-15 NOTE — Telephone Encounter (Signed)
Pt is on the sch today at 12:30 for repeat xrays

## 2018-04-16 MED ORDER — TRANEXAMIC ACID 1000 MG/10ML IV SOLN
2000.0000 mg | INTRAVENOUS | Status: AC
  Administered 2018-04-17: 2000 mg via TOPICAL
  Filled 2018-04-16: qty 20

## 2018-04-16 MED ORDER — DEXTROSE 5 % IV SOLN
3.0000 g | INTRAVENOUS | Status: AC
  Administered 2018-04-17: 3 g via INTRAVENOUS
  Filled 2018-04-16: qty 3

## 2018-04-16 MED ORDER — TRANEXAMIC ACID-NACL 1000-0.7 MG/100ML-% IV SOLN
1000.0000 mg | INTRAVENOUS | Status: AC
  Administered 2018-04-17: 1000 mg via INTRAVENOUS
  Filled 2018-04-16: qty 100

## 2018-04-16 NOTE — Telephone Encounter (Signed)
Yesterdays note was faxed to [facility] yesterday and surgery has been approved. See surgical referral for documentation.

## 2018-04-17 ENCOUNTER — Encounter (HOSPITAL_COMMUNITY)
Admission: RE | Disposition: A | Discharge status: Skilled Nursing Facility | Payer: Self-pay | Source: Home / Self Care | Attending: Orthopedic Surgery

## 2018-04-17 ENCOUNTER — Inpatient Hospital Stay (HOSPITAL_COMMUNITY): Payer: Medicare HMO | Admitting: Registered Nurse

## 2018-04-17 ENCOUNTER — Encounter (HOSPITAL_COMMUNITY): Payer: Self-pay | Admitting: *Deleted

## 2018-04-17 ENCOUNTER — Telehealth (INDEPENDENT_AMBULATORY_CARE_PROVIDER_SITE_OTHER): Payer: Self-pay | Admitting: Orthopedic Surgery

## 2018-04-17 ENCOUNTER — Inpatient Hospital Stay (HOSPITAL_COMMUNITY)
Admission: RE | Admit: 2018-04-17 | Discharge: 2018-04-24 | DRG: 470 | Disposition: A | Discharge status: Skilled Nursing Facility | Payer: Medicare HMO | Attending: Orthopedic Surgery | Admitting: Orthopedic Surgery

## 2018-04-17 ENCOUNTER — Inpatient Hospital Stay (HOSPITAL_COMMUNITY): Payer: Medicare HMO | Admitting: Physician Assistant

## 2018-04-17 DIAGNOSIS — I5022 Chronic systolic (congestive) heart failure: Secondary | ICD-10-CM | POA: Diagnosis present

## 2018-04-17 DIAGNOSIS — E785 Hyperlipidemia, unspecified: Secondary | ICD-10-CM | POA: Diagnosis present

## 2018-04-17 DIAGNOSIS — M1611 Unilateral primary osteoarthritis, right hip: Principal | ICD-10-CM

## 2018-04-17 DIAGNOSIS — K219 Gastro-esophageal reflux disease without esophagitis: Secondary | ICD-10-CM | POA: Diagnosis present

## 2018-04-17 DIAGNOSIS — E11610 Type 2 diabetes mellitus with diabetic neuropathic arthropathy: Secondary | ICD-10-CM | POA: Diagnosis present

## 2018-04-17 DIAGNOSIS — Z6841 Body Mass Index (BMI) 40.0 and over, adult: Secondary | ICD-10-CM | POA: Diagnosis not present

## 2018-04-17 DIAGNOSIS — Z9852 Vasectomy status: Secondary | ICD-10-CM

## 2018-04-17 DIAGNOSIS — I252 Old myocardial infarction: Secondary | ICD-10-CM | POA: Diagnosis not present

## 2018-04-17 DIAGNOSIS — Z89511 Acquired absence of right leg below knee: Secondary | ICD-10-CM

## 2018-04-17 DIAGNOSIS — Z808 Family history of malignant neoplasm of other organs or systems: Secondary | ICD-10-CM

## 2018-04-17 DIAGNOSIS — I2510 Atherosclerotic heart disease of native coronary artery without angina pectoris: Secondary | ICD-10-CM | POA: Diagnosis present

## 2018-04-17 DIAGNOSIS — R21 Rash and other nonspecific skin eruption: Secondary | ICD-10-CM | POA: Diagnosis present

## 2018-04-17 DIAGNOSIS — Z981 Arthrodesis status: Secondary | ICD-10-CM

## 2018-04-17 DIAGNOSIS — G4733 Obstructive sleep apnea (adult) (pediatric): Secondary | ICD-10-CM | POA: Diagnosis present

## 2018-04-17 DIAGNOSIS — Z96653 Presence of artificial knee joint, bilateral: Secondary | ICD-10-CM | POA: Diagnosis present

## 2018-04-17 DIAGNOSIS — K08409 Partial loss of teeth, unspecified cause, unspecified class: Secondary | ICD-10-CM | POA: Diagnosis present

## 2018-04-17 DIAGNOSIS — Z202 Contact with and (suspected) exposure to infections with a predominantly sexual mode of transmission: Secondary | ICD-10-CM | POA: Diagnosis present

## 2018-04-17 DIAGNOSIS — Z87442 Personal history of urinary calculi: Secondary | ICD-10-CM | POA: Diagnosis not present

## 2018-04-17 DIAGNOSIS — Z8701 Personal history of pneumonia (recurrent): Secondary | ICD-10-CM

## 2018-04-17 DIAGNOSIS — D62 Acute posthemorrhagic anemia: Secondary | ICD-10-CM | POA: Diagnosis not present

## 2018-04-17 DIAGNOSIS — M19071 Primary osteoarthritis, right ankle and foot: Secondary | ICD-10-CM | POA: Diagnosis present

## 2018-04-17 DIAGNOSIS — R7611 Nonspecific reaction to tuberculin skin test without active tuberculosis: Secondary | ICD-10-CM | POA: Diagnosis present

## 2018-04-17 DIAGNOSIS — Z915 Personal history of self-harm: Secondary | ICD-10-CM | POA: Diagnosis not present

## 2018-04-17 DIAGNOSIS — Z803 Family history of malignant neoplasm of breast: Secondary | ICD-10-CM

## 2018-04-17 DIAGNOSIS — Z8042 Family history of malignant neoplasm of prostate: Secondary | ICD-10-CM

## 2018-04-17 DIAGNOSIS — Z972 Presence of dental prosthetic device (complete) (partial): Secondary | ICD-10-CM | POA: Diagnosis not present

## 2018-04-17 DIAGNOSIS — Z7901 Long term (current) use of anticoagulants: Secondary | ICD-10-CM

## 2018-04-17 DIAGNOSIS — Z96649 Presence of unspecified artificial hip joint: Secondary | ICD-10-CM

## 2018-04-17 DIAGNOSIS — M797 Fibromyalgia: Secondary | ICD-10-CM | POA: Diagnosis present

## 2018-04-17 DIAGNOSIS — I110 Hypertensive heart disease with heart failure: Secondary | ICD-10-CM | POA: Diagnosis present

## 2018-04-17 DIAGNOSIS — Z833 Family history of diabetes mellitus: Secondary | ICD-10-CM

## 2018-04-17 DIAGNOSIS — Z8619 Personal history of other infectious and parasitic diseases: Secondary | ICD-10-CM

## 2018-04-17 DIAGNOSIS — Z8249 Family history of ischemic heart disease and other diseases of the circulatory system: Secondary | ICD-10-CM

## 2018-04-17 HISTORY — PX: TOTAL HIP ARTHROPLASTY: SHX124

## 2018-04-17 LAB — GLUCOSE, CAPILLARY
GLUCOSE-CAPILLARY: 124 mg/dL — AB (ref 70–99)
Glucose-Capillary: 135 mg/dL — ABNORMAL HIGH (ref 70–99)

## 2018-04-17 SURGERY — ARTHROPLASTY, HIP, TOTAL,POSTERIOR APPROACH
Anesthesia: General | Laterality: Right

## 2018-04-17 MED ORDER — ONDANSETRON HCL 4 MG/2ML IJ SOLN
INTRAMUSCULAR | Status: AC
  Filled 2018-04-17: qty 2

## 2018-04-17 MED ORDER — LIDOCAINE 2% (20 MG/ML) 5 ML SYRINGE
INTRAMUSCULAR | Status: DC | PRN
  Administered 2018-04-17: 80 mg via INTRAVENOUS

## 2018-04-17 MED ORDER — OXYCODONE HCL 5 MG PO TABS
5.0000 mg | ORAL_TABLET | Freq: Once | ORAL | Status: AC | PRN
  Administered 2018-04-17: 5 mg via ORAL

## 2018-04-17 MED ORDER — DULOXETINE HCL 30 MG PO CPEP
30.0000 mg | ORAL_CAPSULE | Freq: Two times a day (BID) | ORAL | Status: DC
  Administered 2018-04-17 – 2018-04-24 (×14): 30 mg via ORAL
  Filled 2018-04-17 (×15): qty 1

## 2018-04-17 MED ORDER — FENTANYL CITRATE (PF) 250 MCG/5ML IJ SOLN
INTRAMUSCULAR | Status: DC | PRN
  Administered 2018-04-17: 50 ug via INTRAVENOUS
  Administered 2018-04-17: 25 ug via INTRAVENOUS
  Administered 2018-04-17 (×4): 50 ug via INTRAVENOUS
  Administered 2018-04-17 (×2): 25 ug via INTRAVENOUS
  Administered 2018-04-17 (×3): 50 ug via INTRAVENOUS
  Administered 2018-04-17: 25 ug via INTRAVENOUS

## 2018-04-17 MED ORDER — EPHEDRINE 5 MG/ML INJ
INTRAVENOUS | Status: AC
  Filled 2018-04-17: qty 10

## 2018-04-17 MED ORDER — PHENOL 1.4 % MT LIQD: 1.0000 | OROMUCOSAL | Status: DC | PRN

## 2018-04-17 MED ORDER — FENTANYL CITRATE (PF) 250 MCG/5ML IJ SOLN
INTRAMUSCULAR | Status: AC
  Filled 2018-04-17: qty 5

## 2018-04-17 MED ORDER — ONDANSETRON HCL 4 MG PO TABS: 4.0000 mg | ORAL_TABLET | Freq: Four times a day (QID) | ORAL | Status: DC | PRN

## 2018-04-17 MED ORDER — OXYCODONE HCL 5 MG/5ML PO SOLN: 5.0000 mg | Freq: Once | ORAL | Status: AC | PRN

## 2018-04-17 MED ORDER — METOCLOPRAMIDE HCL 5 MG/ML IJ SOLN: 5.0000 mg | Freq: Three times a day (TID) | INTRAMUSCULAR | Status: DC | PRN

## 2018-04-17 MED ORDER — FENTANYL CITRATE (PF) 100 MCG/2ML IJ SOLN
25.0000 ug | INTRAMUSCULAR | Status: DC | PRN
  Administered 2018-04-17 (×3): 50 ug via INTRAVENOUS

## 2018-04-17 MED ORDER — PANTOPRAZOLE SODIUM 40 MG PO TBEC
40.0000 mg | DELAYED_RELEASE_TABLET | Freq: Every day | ORAL | Status: DC
  Administered 2018-04-17 – 2018-04-24 (×8): 40 mg via ORAL
  Filled 2018-04-17 (×8): qty 1

## 2018-04-17 MED ORDER — KETOROLAC TROMETHAMINE 15 MG/ML IJ SOLN
7.5000 mg | Freq: Four times a day (QID) | INTRAMUSCULAR | Status: AC
  Administered 2018-04-17 – 2018-04-18 (×3): 7.5 mg via INTRAVENOUS
  Filled 2018-04-17 (×3): qty 1

## 2018-04-17 MED ORDER — HYDROCHLOROTHIAZIDE 25 MG PO TABS
25.0000 mg | ORAL_TABLET | Freq: Every day | ORAL | Status: DC
  Administered 2018-04-17 – 2018-04-24 (×8): 25 mg via ORAL
  Filled 2018-04-17 (×8): qty 1

## 2018-04-17 MED ORDER — GABAPENTIN 400 MG PO CAPS
800.0000 mg | ORAL_CAPSULE | Freq: Three times a day (TID) | ORAL | Status: DC
  Administered 2018-04-17 – 2018-04-24 (×19): 800 mg via ORAL
  Filled 2018-04-17 (×20): qty 2

## 2018-04-17 MED ORDER — MIRABEGRON ER 25 MG PO TB24
50.0000 mg | ORAL_TABLET | Freq: Every day | ORAL | Status: DC
  Administered 2018-04-17 – 2018-04-23 (×7): 50 mg via ORAL
  Filled 2018-04-17 (×7): qty 2

## 2018-04-17 MED ORDER — METOCLOPRAMIDE HCL 5 MG PO TABS: 5.0000 mg | ORAL_TABLET | Freq: Three times a day (TID) | ORAL | Status: DC | PRN

## 2018-04-17 MED ORDER — CEFAZOLIN SODIUM-DEXTROSE 2-4 GM/100ML-% IV SOLN
2.0000 g | Freq: Four times a day (QID) | INTRAVENOUS | Status: AC
  Administered 2018-04-18: 2 g via INTRAVENOUS
  Filled 2018-04-17: qty 100

## 2018-04-17 MED ORDER — BENZTROPINE MESYLATE 0.5 MG PO TABS
0.5000 mg | ORAL_TABLET | Freq: Every day | ORAL | Status: DC
  Administered 2018-04-17 – 2018-04-23 (×6): 0.5 mg via ORAL
  Filled 2018-04-17 (×7): qty 1

## 2018-04-17 MED ORDER — OXYCODONE HCL 5 MG PO TABS: 5.0000 mg | ORAL_TABLET | Freq: Once | ORAL | Status: DC | PRN

## 2018-04-17 MED ORDER — ROCURONIUM BROMIDE 10 MG/ML (PF) SYRINGE
PREFILLED_SYRINGE | INTRAVENOUS | Status: DC | PRN
  Administered 2018-04-17: 20 mg via INTRAVENOUS
  Administered 2018-04-17: 50 mg via INTRAVENOUS
  Administered 2018-04-17: 30 mg via INTRAVENOUS
  Administered 2018-04-17: 50 mg via INTRAVENOUS
  Administered 2018-04-17: 20 mg via INTRAVENOUS

## 2018-04-17 MED ORDER — MENTHOL 3 MG MT LOZG: 1.0000 | LOZENGE | OROMUCOSAL | Status: DC | PRN

## 2018-04-17 MED ORDER — PROPOFOL 10 MG/ML IV BOLUS
INTRAVENOUS | Status: DC | PRN
  Administered 2018-04-17: 50 mg via INTRAVENOUS
  Administered 2018-04-17: 120 mg via INTRAVENOUS

## 2018-04-17 MED ORDER — SUGAMMADEX SODIUM 200 MG/2ML IV SOLN
INTRAVENOUS | Status: DC | PRN
  Administered 2018-04-17: 294 mg via INTRAVENOUS
  Administered 2018-04-17: 100 mg via INTRAVENOUS

## 2018-04-17 MED ORDER — DOCUSATE SODIUM 100 MG PO CAPS
100.0000 mg | ORAL_CAPSULE | Freq: Two times a day (BID) | ORAL | Status: DC
  Administered 2018-04-17 – 2018-04-24 (×14): 100 mg via ORAL
  Filled 2018-04-17 (×14): qty 1

## 2018-04-17 MED ORDER — FERROUS SULFATE 325 (65 FE) MG PO TABS
325.0000 mg | ORAL_TABLET | Freq: Three times a day (TID) | ORAL | Status: DC
  Administered 2018-04-17 – 2018-04-24 (×19): 325 mg via ORAL
  Filled 2018-04-17 (×19): qty 1

## 2018-04-17 MED ORDER — ACETAMINOPHEN 325 MG PO TABS
325.0000 mg | ORAL_TABLET | Freq: Four times a day (QID) | ORAL | Status: DC | PRN
  Administered 2018-04-18: 650 mg via ORAL
  Filled 2018-04-17: qty 2

## 2018-04-17 MED ORDER — OXYCODONE HCL 5 MG PO TABS
ORAL_TABLET | ORAL | Status: AC
  Filled 2018-04-17: qty 1

## 2018-04-17 MED ORDER — ESMOLOL HCL 100 MG/10ML IV SOLN
INTRAVENOUS | Status: DC | PRN
  Administered 2018-04-17: 10 mg via INTRAVENOUS

## 2018-04-17 MED ORDER — CLONAZEPAM 0.5 MG PO TABS
0.5000 mg | ORAL_TABLET | Freq: Every day | ORAL | Status: DC
  Administered 2018-04-17 – 2018-04-23 (×7): 0.5 mg via ORAL
  Filled 2018-04-17 (×7): qty 1

## 2018-04-17 MED ORDER — ONDANSETRON HCL 4 MG/2ML IJ SOLN
INTRAMUSCULAR | Status: DC | PRN
  Administered 2018-04-17: 4 mg via INTRAVENOUS

## 2018-04-17 MED ORDER — ALUM & MAG HYDROXIDE-SIMETH 200-200-20 MG/5ML PO SUSP: 30.0000 mL | ORAL | Status: DC | PRN

## 2018-04-17 MED ORDER — METHOCARBAMOL 500 MG PO TABS
500.0000 mg | ORAL_TABLET | Freq: Four times a day (QID) | ORAL | Status: DC | PRN
  Administered 2018-04-17 – 2018-04-23 (×12): 500 mg via ORAL
  Filled 2018-04-17 (×12): qty 1

## 2018-04-17 MED ORDER — TRAZODONE HCL 50 MG PO TABS
150.0000 mg | ORAL_TABLET | Freq: Every day | ORAL | Status: DC
  Administered 2018-04-17 – 2018-04-23 (×7): 150 mg via ORAL
  Filled 2018-04-17 (×7): qty 1

## 2018-04-17 MED ORDER — ONDANSETRON HCL 4 MG/2ML IJ SOLN: 4.0000 mg | Freq: Once | INTRAMUSCULAR | Status: DC | PRN

## 2018-04-17 MED ORDER — PROPOFOL 10 MG/ML IV BOLUS
INTRAVENOUS | Status: AC
  Filled 2018-04-17: qty 20

## 2018-04-17 MED ORDER — FENTANYL CITRATE (PF) 100 MCG/2ML IJ SOLN: 25.0000 ug | INTRAMUSCULAR | Status: DC | PRN

## 2018-04-17 MED ORDER — SODIUM CHLORIDE 0.9 % IV SOLN
INTRAVENOUS | Status: DC
  Administered 2018-04-17: 18:00:00 via INTRAVENOUS

## 2018-04-17 MED ORDER — OXYCODONE HCL 5 MG PO TABS
5.0000 mg | ORAL_TABLET | ORAL | Status: DC | PRN
  Administered 2018-04-17 – 2018-04-22 (×12): 10 mg via ORAL
  Filled 2018-04-17 (×12): qty 2

## 2018-04-17 MED ORDER — DEXAMETHASONE SODIUM PHOSPHATE 10 MG/ML IJ SOLN
INTRAMUSCULAR | Status: AC
  Filled 2018-04-17: qty 1

## 2018-04-17 MED ORDER — FENTANYL CITRATE (PF) 100 MCG/2ML IJ SOLN
INTRAMUSCULAR | Status: AC
  Filled 2018-04-17: qty 2

## 2018-04-17 MED ORDER — CHLORHEXIDINE GLUCONATE 4 % EX LIQD: 60.0000 mL | Freq: Once | CUTANEOUS | Status: DC

## 2018-04-17 MED ORDER — 0.9 % SODIUM CHLORIDE (POUR BTL) OPTIME
TOPICAL | Status: DC | PRN
  Administered 2018-04-17: 1000 mL

## 2018-04-17 MED ORDER — POLYETHYLENE GLYCOL 3350 17 G PO PACK: 17.0000 g | PACK | Freq: Every day | ORAL | Status: DC | PRN

## 2018-04-17 MED ORDER — MIDAZOLAM HCL 5 MG/5ML IJ SOLN
INTRAMUSCULAR | Status: DC | PRN
  Administered 2018-04-17: 2 mg via INTRAVENOUS

## 2018-04-17 MED ORDER — MIDAZOLAM HCL 2 MG/2ML IJ SOLN
INTRAMUSCULAR | Status: AC
  Filled 2018-04-17: qty 2

## 2018-04-17 MED ORDER — HYDROMORPHONE HCL 1 MG/ML IJ SOLN: 0.5000 mg | INTRAMUSCULAR | Status: DC | PRN

## 2018-04-17 MED ORDER — SUCCINYLCHOLINE CHLORIDE 200 MG/10ML IV SOSY
PREFILLED_SYRINGE | INTRAVENOUS | Status: DC | PRN
  Administered 2018-04-17: 140 mg via INTRAVENOUS

## 2018-04-17 MED ORDER — OXYCODONE HCL 5 MG/5ML PO SOLN: 5.0000 mg | Freq: Once | ORAL | Status: DC | PRN

## 2018-04-17 MED ORDER — ASPIRIN EC 325 MG PO TBEC
325.0000 mg | DELAYED_RELEASE_TABLET | Freq: Every day | ORAL | Status: DC
  Administered 2018-04-18 – 2018-04-24 (×7): 325 mg via ORAL
  Filled 2018-04-17 (×8): qty 1

## 2018-04-17 MED ORDER — ONDANSETRON HCL 4 MG/2ML IJ SOLN: 4.0000 mg | Freq: Four times a day (QID) | INTRAMUSCULAR | Status: DC | PRN

## 2018-04-17 MED ORDER — ARIPIPRAZOLE 5 MG PO TABS
5.0000 mg | ORAL_TABLET | Freq: Every day | ORAL | Status: DC
  Administered 2018-04-17 – 2018-04-24 (×8): 5 mg via ORAL
  Filled 2018-04-17 (×8): qty 1

## 2018-04-17 MED ORDER — SODIUM CHLORIDE 0.9 % IR SOLN
Status: DC | PRN
  Administered 2018-04-17: 3000 mL

## 2018-04-17 MED ORDER — ROCURONIUM BROMIDE 50 MG/5ML IV SOSY
PREFILLED_SYRINGE | INTRAVENOUS | Status: AC
  Filled 2018-04-17: qty 5

## 2018-04-17 MED ORDER — NYSTATIN 100000 UNIT/GM EX POWD
Freq: Three times a day (TID) | CUTANEOUS | Status: DC
  Administered 2018-04-17 – 2018-04-24 (×20): via TOPICAL
  Filled 2018-04-17 (×2): qty 15

## 2018-04-17 MED ORDER — LACTATED RINGERS IV SOLN
INTRAVENOUS | Status: DC | PRN
  Administered 2018-04-17 (×2): via INTRAVENOUS

## 2018-04-17 MED ORDER — FENTANYL CITRATE (PF) 100 MCG/2ML IJ SOLN
INTRAMUSCULAR | Status: AC
  Administered 2018-04-17: 50 ug via INTRAVENOUS
  Filled 2018-04-17: qty 2

## 2018-04-17 MED ORDER — CEFAZOLIN SODIUM-DEXTROSE 2-4 GM/100ML-% IV SOLN
2.0000 g | Freq: Four times a day (QID) | INTRAVENOUS | Status: DC
  Administered 2018-04-17: 2 g via INTRAVENOUS
  Filled 2018-04-17 (×3): qty 100

## 2018-04-17 MED ORDER — BISACODYL 5 MG PO TBEC: 5.0000 mg | DELAYED_RELEASE_TABLET | Freq: Every day | ORAL | Status: DC | PRN

## 2018-04-17 MED ORDER — OXYCODONE HCL 5 MG PO TABS
10.0000 mg | ORAL_TABLET | ORAL | Status: DC | PRN
  Administered 2018-04-18 (×4): 10 mg via ORAL
  Administered 2018-04-19: 15 mg via ORAL
  Administered 2018-04-19 (×3): 10 mg via ORAL
  Administered 2018-04-22 – 2018-04-24 (×8): 15 mg via ORAL
  Filled 2018-04-17 (×2): qty 3
  Filled 2018-04-17 (×3): qty 2
  Filled 2018-04-17 (×2): qty 3
  Filled 2018-04-17: qty 2
  Filled 2018-04-17: qty 3
  Filled 2018-04-17 (×2): qty 2
  Filled 2018-04-17 (×3): qty 3
  Filled 2018-04-17: qty 2
  Filled 2018-04-17: qty 3

## 2018-04-17 MED ORDER — ATORVASTATIN CALCIUM 10 MG PO TABS
20.0000 mg | ORAL_TABLET | Freq: Every day | ORAL | Status: DC
  Administered 2018-04-17 – 2018-04-24 (×8): 20 mg via ORAL
  Filled 2018-04-17 (×8): qty 2

## 2018-04-17 MED ORDER — DEXAMETHASONE SODIUM PHOSPHATE 10 MG/ML IJ SOLN
INTRAMUSCULAR | Status: DC | PRN
  Administered 2018-04-17: 4 mg via INTRAVENOUS

## 2018-04-17 MED ORDER — DIPHENHYDRAMINE HCL 12.5 MG/5ML PO ELIX: 12.5000 mg | ORAL_SOLUTION | ORAL | Status: DC | PRN

## 2018-04-17 MED ORDER — PREGABALIN 75 MG PO CAPS: 150.0000 mg | ORAL_CAPSULE | Freq: Two times a day (BID) | ORAL | Status: DC

## 2018-04-17 MED ORDER — PRASUGREL HCL 5 MG PO TABS
5.0000 mg | ORAL_TABLET | Freq: Every day | ORAL | Status: DC
  Administered 2018-04-17 – 2018-04-24 (×8): 5 mg via ORAL
  Filled 2018-04-17 (×8): qty 1

## 2018-04-17 MED ORDER — PHENYLEPHRINE 40 MCG/ML (10ML) SYRINGE FOR IV PUSH (FOR BLOOD PRESSURE SUPPORT)
PREFILLED_SYRINGE | INTRAVENOUS | Status: DC | PRN
  Administered 2018-04-17 (×3): 80 ug via INTRAVENOUS
  Administered 2018-04-17: 40 ug via INTRAVENOUS
  Administered 2018-04-17 (×2): 80 ug via INTRAVENOUS

## 2018-04-17 MED ORDER — MAGNESIUM CITRATE PO SOLN: 1.0000 | Freq: Once | ORAL | Status: DC | PRN

## 2018-04-17 MED ORDER — GABAPENTIN 800 MG PO TABS
800.0000 mg | ORAL_TABLET | Freq: Three times a day (TID) | ORAL | Status: DC
  Filled 2018-04-17 (×2): qty 1

## 2018-04-17 MED ORDER — LIDOCAINE 2% (20 MG/ML) 5 ML SYRINGE
INTRAMUSCULAR | Status: AC
  Filled 2018-04-17: qty 5

## 2018-04-17 MED ORDER — METHOCARBAMOL 1000 MG/10ML IJ SOLN
500.0000 mg | Freq: Four times a day (QID) | INTRAVENOUS | Status: DC | PRN
  Filled 2018-04-17: qty 5

## 2018-04-17 SURGICAL SUPPLY — 61 items
BLADE SAW SGTL 73X25 THK (BLADE) ×2 IMPLANT
BLADE SURG 10 STRL SS (BLADE) IMPLANT
BLADE SURG 21 STRL SS (BLADE) ×2 IMPLANT
BRUSH FEMORAL CANAL (MISCELLANEOUS) IMPLANT
CANISTER WOUNDNEG PRESSURE 500 (CANNISTER) ×1 IMPLANT
COVER BACK TABLE 24X17X13 BIG (DRAPES) IMPLANT
COVER SURGICAL LIGHT HANDLE (MISCELLANEOUS) ×4 IMPLANT
COVER WAND RF STERILE (DRAPES) ×2 IMPLANT
CUP ACET PNNCL SECTR W/GRIP 56 (Hips) IMPLANT
DRAPE IMP U-DRAPE 54X76 (DRAPES) ×2 IMPLANT
DRAPE INCISE IOBAN 85X60 (DRAPES) ×2 IMPLANT
DRAPE ORTHO SPLIT 77X108 STRL (DRAPES) ×4
DRAPE SURG ORHT 6 SPLT 77X108 (DRAPES) ×2 IMPLANT
DRAPE U-SHAPE 47X51 STRL (DRAPES) ×2 IMPLANT
DRESSING PEEL AND PLC PRVNA 13 (GAUZE/BANDAGES/DRESSINGS) IMPLANT
DRSG MEPILEX BORDER 4X12 (GAUZE/BANDAGES/DRESSINGS) ×1 IMPLANT
DRSG MEPILEX BORDER 4X8 (GAUZE/BANDAGES/DRESSINGS) IMPLANT
DRSG PEEL AND PLACE PREVENA 13 (GAUZE/BANDAGES/DRESSINGS) ×2
DURAPREP 26ML APPLICATOR (WOUND CARE) ×2 IMPLANT
ELECT BLADE 6.5 EXT (BLADE) IMPLANT
ELECT CAUTERY BLADE 6.4 (BLADE) ×2 IMPLANT
ELECT REM PT RETURN 9FT ADLT (ELECTROSURGICAL) ×2
ELECTRODE REM PT RTRN 9FT ADLT (ELECTROSURGICAL) ×1 IMPLANT
GLOVE BIOGEL M 8.0 STRL (GLOVE) ×1 IMPLANT
GLOVE BIOGEL PI IND STRL 8 (GLOVE) IMPLANT
GLOVE BIOGEL PI IND STRL 9 (GLOVE) ×1 IMPLANT
GLOVE BIOGEL PI INDICATOR 8 (GLOVE) ×1
GLOVE BIOGEL PI INDICATOR 9 (GLOVE) ×3
GLOVE SURG ORTHO 9.0 STRL STRW (GLOVE) ×2 IMPLANT
GOWN STRL REUS W/ TWL XL LVL3 (GOWN DISPOSABLE) ×2 IMPLANT
GOWN STRL REUS W/TWL 2XL LVL3 (GOWN DISPOSABLE) ×1 IMPLANT
GOWN STRL REUS W/TWL XL LVL3 (GOWN DISPOSABLE) ×4
HANDPIECE INTERPULSE COAX TIP (DISPOSABLE)
HEAD M SROM 36MM 2 (Hips) IMPLANT
KIT BASIN OR (CUSTOM PROCEDURE TRAY) ×2 IMPLANT
KIT TURNOVER KIT B (KITS) ×2 IMPLANT
LINER NEUTRAL 52MMX36MMX56 (Hips) ×1 IMPLANT
MANIFOLD NEPTUNE II (INSTRUMENTS) ×2 IMPLANT
NS IRRIG 1000ML POUR BTL (IV SOLUTION) ×2 IMPLANT
PACK TOTAL JOINT (CUSTOM PROCEDURE TRAY) ×2 IMPLANT
PACK UNIVERSAL I (CUSTOM PROCEDURE TRAY) ×2 IMPLANT
PAD ARMBOARD 7.5X6 YLW CONV (MISCELLANEOUS) ×4 IMPLANT
PINN SECTOR W/GRIP ACE CUP 56 (Hips) ×2 IMPLANT
PRESSURIZER FEMORAL UNIV (MISCELLANEOUS) IMPLANT
SET HNDPC FAN SPRY TIP SCT (DISPOSABLE) IMPLANT
SROM M HEAD 36MM 2 (Hips) ×2 IMPLANT
STAPLER SKIN PROX WIDE 3.9 (STAPLE) ×1 IMPLANT
STAPLER VISISTAT 35W (STAPLE) ×2 IMPLANT
STEM FEMORAL HIP KLA SIZE 16 (Stem) ×1 IMPLANT
SUT ETHIBOND NAB CT1 #1 30IN (SUTURE) ×2 IMPLANT
SUT VIC AB 0 CT1 27 (SUTURE) ×4
SUT VIC AB 0 CT1 27XBRD ANBCTR (SUTURE) ×2 IMPLANT
SUT VIC AB 1 CTX 36 (SUTURE) ×2
SUT VIC AB 1 CTX36XBRD ANBCTR (SUTURE) ×1 IMPLANT
SUT VIC AB 2-0 CTB1 (SUTURE) ×4 IMPLANT
TOWEL OR 17X24 6PK STRL BLUE (TOWEL DISPOSABLE) ×3 IMPLANT
TOWEL OR 17X26 10 PK STRL BLUE (TOWEL DISPOSABLE) ×2 IMPLANT
TOWER CARTRIDGE SMART MIX (DISPOSABLE) IMPLANT
TRAY CATH 16FR W/PLASTIC CATH (SET/KITS/TRAYS/PACK) IMPLANT
TRAY FOLEY MTR SLVR 16FR STAT (SET/KITS/TRAYS/PACK) IMPLANT
WATER STERILE IRR 1000ML POUR (IV SOLUTION) ×6 IMPLANT

## 2018-04-17 NOTE — H&P (Signed)
TOTAL HIP ADMISSION H&P  Patient is admitted for right total hip arthroplasty.  Subjective:  Chief Complaint: right hip pain  HPI: Travis Novak Sr., 67 y.o. male, has a history of pain and functional disability in the right hip(s) due to arthritis and patient has failed non-surgical conservative treatments for greater than 12 weeks to include NSAID's and/or analgesics, use of assistive devices, weight reduction as appropriate and activity modification.  Onset of symptoms was gradual starting 8 years ago with gradually worsening course since that time.The patient noted no past surgery on the right hip(s).  Patient currently rates pain in the right hip at 8 out of 10 with activity. Patient has night pain, worsening of pain with activity and weight bearing, trendelenberg gait, pain that interfers with activities of daily living, pain with passive range of motion, crepitus and joint swelling. Patient has evidence of subchondral cysts, subchondral sclerosis, periarticular osteophytes and joint space narrowing by imaging studies. This condition presents safety issues increasing the risk of falls. This patient has had avascular necrosis of the hip, acetabular fracture, hip dysplasia.  There is no current active infection.  Patient Active Problem List   Diagnosis Date Noted  . Morbid obesity with BMI of 40.0-44.9, adult ([place]) 02/28/2018  . Myocardial infarction ([place]) 02/25/2018  . Coronary artery disease involving native coronary artery of native heart 02/25/2018  . PAD (peripheral artery disease) ([place]) 02/25/2018  . S/P BKA (below knee amputation) unilateral, right ([place]) 02/25/2018  . Sacroiliitis ([place]) 02/25/2018  . SOB (shortness of breath) 01/03/2018  . Acute on chronic heart failure ([place]) 01/03/2018  . Severe right groin pain 12/20/2017  . Morbid obesity ([place]) 10/09/2017  . Exposure to sexually transmitted disease (STD) 07/14/2017  . Low back pain 07/13/2017  . Leukoplakia, tongue  01/26/2017  . Acute sinus infection 10/20/2016  . Hyperglycemia 10/20/2016  . Skin lesion 10/20/2016  . S/P unilateral BKA (below knee amputation), right ([place]) 06/14/2016  . Charcot foot due to diabetes mellitus ([place])   . Charcot's arthropathy associated with type 2 diabetes mellitus ([place]) 04/11/2016  . Preventative health care 11/05/2015  . Major depression 09/13/2015  . S/P TKR (total knee replacement) bilaterally 09/13/2015  . GERD (gastroesophageal reflux disease) 09/08/2015  . S/P laparoscopic hernia repair 05/11/2015  . PPD positive 04/08/2015  . Benign neoplasm of descending colon   . Benign neoplasm of cecum   . AKI (acute kidney injury) ([place]) 10/18/2014  . Acute blood loss anemia   . Chronic anticoagulation   . Occult blood positive stool 10/17/2014  . General weakness 07/14/2014  . Urinary incontinence 07/14/2014  . Headache(784.0) 10/15/2013  . Spinal stenosis in cervical region 09/26/2013    Class: Chronic  . Spinal stenosis, lumbar region, with neurogenic claudication 09/26/2013    Class: Chronic  . Hand joint pain 06/10/2013  . Rotator cuff tear arthropathy of both shoulders 06/10/2013  . Skin lesion of cheek 05/01/2013  . Pain of right thumb 04/03/2013  . Balance disorder 03/12/2013  . Gait disorder 03/12/2013  . Tremor 03/12/2013  . Left hip pain 03/12/2013  . Pre-ulcerative corn or callous 02/06/2013  . Anxiety 11/12/2011  . OSA (obstructive sleep apnea) 11/07/2011  . Bradycardia 10/20/2011  . Insomnia 10/04/2011  . Obesity 01/12/2011  . ERECTILE DYSFUNCTION, ORGANIC 05/30/2010  . [place] DISEASE, LUMBAR 04/19/2010  . SCIATICA, LEFT 04/19/2010  . Chronic pain syndrome 10/27/2009  . Hyperlipidemia 07/15/2009  . Essential hypertension 06/24/2009  . Coronary artery disease involving native coronary artery  of native heart without angina pectoris 06/24/2009  . Allergic rhinitis 06/24/2009  . URETHRAL STRICTURE 06/24/2009  . DEGENERATIVE JOINT DISEASE  06/24/2009  . SHOULDER PAIN, BILATERAL 06/24/2009  . FATIGUE 06/24/2009  . NEPHROLITHIASIS, HX OF 06/24/2009   Past Medical History:  Diagnosis Date  . ALLERGIC RHINITIS 06/24/2009  . Anxiety 11/12/2011   Adequate for discharge   . Arthritis    "all my joints" (09/30/2013)  . Arthritis of foot, right, degenerative 04/15/2014  . Balance disorder 03/12/2013  . Benign neoplasm of cecum   . Benign neoplasm of descending colon   . CAD (coronary artery disease) 06/24/2009   5 stents placed in 2007    . Chronic anticoagulation   . Chronic pain syndrome 10/27/2009   of ankle, shoulders, low back.  sciatica.   . Closed fracture of right foot 10/17/2014  . CORONARY ARTERY DISEASE 06/24/2009   a. s/p multiple PCIs - In 2008 he had a Taxus DES to the mild LAD, Endeavor DES to mid LCX and distal LCX. In January 2009 he had DES to distal LCX, mid LCX and proximal LCX. In November 2009 had BMS x 2 to the mid RCA. Cath 10/2011 with patent stents, noncardiac CP. LHC 01/2013: patent stents (noncardiac CP).  . DEGENERATIVE JOINT DISEASE 06/24/2009   Qualifier: Diagnosis of  By: [name], [name]   . Depression   . Depression with anxiety    Prior suicide attempt  . [place] DISEASE, LUMBAR 04/19/2010  . ERECTILE DYSFUNCTION, ORGANIC 05/30/2010  . Essential hypertension 06/24/2009   Qualifier: Diagnosis of  By: [name], [name]   . Fibromyalgia   . Fracture dislocation of ankle joint 09/02/2015  . Gait disorder 03/12/2013  . General weakness 07/14/2014  . GERD (gastroesophageal reflux disease) 09/08/2015  . Hand joint pain 06/10/2013  . Heart murmur   . Hepatitis C   . History of kidney stones   . Hyperlipidemia 07/15/2009   Qualifier: Diagnosis of  By: Aundra Dubin, MD, [place]    . HYPERLIPIDEMIA-MIXED 07/15/2009  . HYPERTENSION 06/24/2009  . Insomnia 10/04/2011  . Irregular heart beat   . Left hip pain 03/12/2013   Injected under ultrasound guidance on June 24, 2013   . Major depression 09/13/2015  . Myocardial  infarction ([place]) 2008  . Non-cardiac chest pain 10/2011, 01/2013  . Obesity   . Occult blood positive stool 10/17/2014  . Open ankle fracture 09/02/2015  . OSA (obstructive sleep apnea)    not using CPAP (09/30/2013)  . Pain of right thumb 04/03/2013  . Pneumonia   . PPD positive 04/08/2015  . Pre-ulcerative corn or callous 02/06/2013  . Rotator cuff tear arthropathy of both shoulders 06/10/2013   History of bilateral shoulder cuff surgery for rotator cuff tears. Reports increase in pain 09/11/2015 during physical therapy of the left shoulder.   [name] SCIATICA, LEFT 04/19/2010   Qualifier: Diagnosis of  By: [name], [name]   . Spinal stenosis in cervical region 09/26/2013  . Spinal stenosis, lumbar region, with neurogenic claudication 09/26/2013  . Type II diabetes mellitus ([place]) 2012   no meds in 09/2014.   [name] Uncontrolled type 2 DM with peripheral circulatory disorder ([place]) 10/04/2013  . URETHRAL STRICTURE 06/24/2009   self catheterizes.     Past Surgical History:  Procedure Laterality Date  . AMPUTATION Right 06/14/2016   Procedure: AMPUTATION BELOW KNEE;  Surgeon: Newt Minion, MD;  Location: [facility];  Service: Orthopedics;  Laterality: Right;  . ANKLE  FUSION Right 04/15/2014   Procedure: Right Subtalar, Talonavicular Fusion;  Surgeon: Newt Minion, MD;  Location: [facility];  Service: Orthopedics;  Laterality: Right;  . ANKLE FUSION Right 04/18/2016   Right Ankle Tibiocalcaneal Fusion; Removal of deep retained hardware; application of wound vac/notes 04/18/2016  . ANKLE FUSION Right 04/18/2016   Procedure: Right Ankle Tibiocalcaneal Fusion;  Surgeon: Newt Minion, MD;  Location: [facility];  Service: Orthopedics;  Laterality: Right;  . ANTERIOR CERVICAL DECOMP/DISCECTOMY FUSION N/A 09/26/2013   Procedure: ANTERIOR CERVICAL DISCECTOMY FUSION C3-4, plate and screw fixation, allograft bone graft;  Surgeon: Jessy Oto, MD;  Location: [facility];  Service: Orthopedics;  Laterality: N/A;  . BACK SURGERY    . BELOW  KNEE LEG AMPUTATION Right 06/14/2016  . CARDIAC CATHETERIZATION  X 1  . CARPAL TUNNEL RELEASE Bilateral   . COLONOSCOPY    . COLONOSCOPY N/A 10/22/2014   Procedure: COLONOSCOPY;  Surgeon: Lafayette Dragon, MD;  Location[facility] ENDOSCOPY;  Service: Endoscopy;  Laterality: N/A;  . CORONARY ANGIOPLASTY WITH STENT PLACEMENT     "I have 9 stents"  . ESOPHAGOGASTRODUODENOSCOPY N/A 10/19/2014   Procedure: ESOPHAGOGASTRODUODENOSCOPY (EGD);  Surgeon: Jerene Bears, MD;  Location: [facility] ENDOSCOPY;  Service: Endoscopy;  Laterality: N/A;  . FUSION OF TALONAVICULAR JOINT Right 04/15/2014   dr Louvina Cleary  . HERNIA REPAIR     umbilical  . INGUINAL HERNIA REPAIR Right 05/11/2015   Procedure: LAPAROSCOPIC REPAIR RIGHT  INGUINAL HERNIA;  Surgeon: Greer Pickerel, MD;  Location: [place];  Service: General;  Laterality: Right;  . INSERTION OF MESH Right 05/11/2015   Procedure: INSERTION OF MESH;  Surgeon: Greer Pickerel, MD;  Location: [place];  Service: General;  Laterality: Right;  . JOINT REPLACEMENT Bilateral    knees  . KNEE CARTILAGE SURGERY Right X 12   "~ 1/2 open; ~ 1/2 scopes"  . KNEE CARTILAGE SURGERY Left X 3   "3 scopes"  . LEFT HEART CATHETERIZATION WITH CORONARY ANGIOGRAM N/A 02/10/2013   Procedure: LEFT HEART CATHETERIZATION WITH CORONARY ANGIOGRAM;  Surgeon: Burnell Blanks, MD;  Location[facility] CATH LAB;  Service: Cardiovascular;  Laterality: N/A;  . LUMBAR LAMINECTOMY/DECOMPRESSION MICRODISCECTOMY N/A 01/27/2014   Procedure: CENTRAL LUMBAR LAMINECTOMY L4-5 AND L3-4;  Surgeon: Jessy Oto, MD;  Location: [place];  Service: Orthopedics;  Laterality: N/A;  . ORIF ANKLE FRACTURE Right 09/02/2015   Procedure: OPEN REDUCTION INTERNAL FIXATION (ORIF) ANKLE FRACTURE;  Surgeon: Newt Minion, MD;  Location: [facility];  Service: Orthopedics;  Laterality: Right;  . PERIPHERALLY INSERTED CENTRAL CATHETER INSERTION  09/02/2015  . SHOULDER ARTHROSCOPY W/ ROTATOR CUFF REPAIR Bilateral    "3 on the right; 1 on the left"  . SKIN SPLIT  GRAFT Right 10/01/2015   Procedure: RIGHT ANKLE APPLY SKIN GRAFT SPLIT THICKNESS;  Surgeon: Newt Minion, MD;  Location: [facility];  Service: Orthopedics;  Laterality: Right;  . TONSILLECTOMY    . TOOTH EXTRACTION    . TOTAL KNEE ARTHROPLASTY Bilateral 2008  . UMBILICAL HERNIA REPAIR     UHR  . URETHRAL DILATION  X 4  . VASECTOMY    . WISDOM TOOTH EXTRACTION      Current Facility-Administered Medications  Medication Dose Route Frequency Provider Last Rate Last Dose  . ceFAZolin (ANCEF) 3 g in dextrose 5 % 50 mL IVPB  3 g Intravenous To SS-Surg Newt Minion, MD      . chlorhexidine (HIBICLENS) 4 % liquid 4 application  60 mL Topical  Once [place], Neta Mends, PA-C      . tranexamic acid (CYKLOKAPRON) 2,000 mg in sodium chloride 0.9 % 50 mL Topical Application  9,509 mg Topical To OR Newt Minion, MD      . tranexamic acid (CYKLOKAPRON) IVPB 1,000 mg  1,000 mg Intravenous To OR Newt Minion, MD       No Known Allergies  Social History   Tobacco Use  . Smoking status: Former Smoker    Types: Cigars    Last attempt to quit: 08/28/2010    Years since quitting: 7.6  . Smokeless tobacco: Never Used  . Tobacco comment: 04/18/2016 "smoked 1 cigar/wk when I did smoke"  Substance Use Topics  . Alcohol use: No    Alcohol/week: 0.0 standard drinks    Comment: rarely    Family History  Problem Relation Age of Onset  . Depression Mother   . Heart disease Mother   . Hypertension Mother   . Breast cancer Mother   . Diabetes Father   . Heart disease Father        CABG  . Hypertension Father   . Hyperlipidemia Father   . Prostate cancer Father   . Skin cancer Father   . Depression Brother        x 2  . Hypertension Brother        x2  . Healthy Son   . Heart disease Maternal Grandfather   . Early death Maternal Grandfather   . Heart attack Maternal Grandfather 08-Aug-2063  . Early death Paternal Grandfather   . Coronary artery disease Other   . Hypertension Other   . Depression Other    . Healthy Son      Review of Systems  All other systems reviewed and are negative.   Objective:  Physical Exam  Vital signs in last 24 hours: Temp:  [98.5 F (36.9 C)] 98.5 F (36.9 C) (11/27 0611) Pulse Rate:  [63] 63 (11/27 0611) Resp:  [20] 20 (11/27 0611) BP: (173)/(64) 173/64 (11/27 0611) SpO2:  [97 %] 97 % (11/27 0611)  Labs:   Estimated body mass index is 43.94 kg/m as calculated from the following:   Height as of 04/15/18: 6' (1.829 m).   Weight as of 04/15/18: 147 kg.   Imaging Review Plain radiographs demonstrate moderate degenerative joint disease of the right hip(s). The bone quality appears to be adequate for age and reported activity level.    Preoperative templating of the joint replacement has been completed, documented, and submitted to the Operating Room personnel in order to optimize intra-operative equipment management.     Assessment/Plan:  End stage arthritis, right hip(s)  The patient history, physical examination, clinical judgement of the provider and imaging studies are consistent with end stage degenerative joint disease of the right hip(s) and total hip arthroplasty is deemed medically necessary. The treatment options including medical management, injection therapy, arthroscopy and arthroplasty were discussed at length. The risks and benefits of total hip arthroplasty were presented and reviewed. The risks due to aseptic loosening, infection, stiffness, dislocation/subluxation,  thromboembolic complications and other imponderables were discussed.  The patient acknowledged the explanation, agreed to proceed with the plan and consent was signed. Patient is being admitted for inpatient treatment for surgery, pain control, PT, OT, prophylactic antibiotics, VTE prophylaxis, progressive ambulation and ADL's and discharge planning.The patient is planning to be discharged home with home health services

## 2018-04-17 NOTE — Progress Notes (Signed)
Patient transferred from PACU to 5N 19. Patient alert oriented x 4, no c/o pain at this time. VS WNR. Patient in bed comfortable, bedside table, call light and telephone within patient reach. Will continue to monitor patient.

## 2018-04-17 NOTE — Anesthesia Postprocedure Evaluation (Signed)
Anesthesia Post Note  Patient: [name]  Procedure(s) Performed: RIGHT TOTAL HIP ARTHROPLASTY (Right )     Patient location during evaluation: PACU Anesthesia Type: General Level of consciousness: awake and alert Pain management: pain level controlled Vital Signs Assessment: post-procedure vital signs reviewed and stable Respiratory status: spontaneous breathing, nonlabored ventilation and respiratory function stable Cardiovascular status: blood pressure returned to baseline and stable Postop Assessment: no apparent nausea or vomiting Anesthetic complications: no    Last Vitals:  Vitals:   04/17/18 1033 04/17/18 1047  BP: 122/65 127/71  Pulse: 85 80  Resp: 18 11  Temp:  36.6 C  SpO2: 99% 98%    Last Pain:  Vitals:   04/17/18 1047  TempSrc:   PainSc: 4         RLE Motor Response: Purposeful movement;Responds to commands (04/17/18 1047) RLE Sensation: Full sensation;No numbness;No pain;No tingling (04/17/18 1047)      Travis [name]

## 2018-04-17 NOTE — Telephone Encounter (Signed)
Patient called advised the hospital will not let him put the swinker on or his sleeve for his orthotic without Dr. Jess Barters  permission. The number to contact patient is [phone]

## 2018-04-17 NOTE — Transfer of Care (Signed)
Immediate Anesthesia Transfer of Care Note  Patient: Travis GOODLIN Sr.  Procedure(s) Performed: RIGHT TOTAL HIP ARTHROPLASTY (Right )  Patient Location: PACU  Anesthesia Type:General  Level of Consciousness: awake, alert  and oriented  Airway & Oxygen Therapy: Patient Spontanous Breathing and Patient connected to face mask oxygen  Post-op Assessment: Report given to RN and Post -op Vital signs reviewed and stable  Post vital signs: Reviewed and stable  Last Vitals:  Vitals Value Taken Time  BP 152/83 04/17/2018  9:48 AM  Temp 36.2 C 04/17/2018  9:47 AM  Pulse 88 04/17/2018  9:47 AM  Resp 13 04/17/2018  9:47 AM  SpO2 100 % 04/17/2018  9:47 AM  Vitals shown include unvalidated device data.  Last Pain:  Vitals:   04/17/18 0611  TempSrc: Oral  PainSc:       Patients Stated Pain Goal: 8 (85/88/50 2774)  Complications: No apparent anesthesia complications

## 2018-04-17 NOTE — Anesthesia Procedure Notes (Signed)
Procedure Name: Intubation Date/Time: 04/17/2018 7:50 AM Performed by: Jearld Pies, CRNA Pre-anesthesia Checklist: Patient identified, Emergency Drugs available, Suction available and Patient being monitored Patient Re-evaluated:Patient Re-evaluated prior to induction Oxygen Delivery Method: Circle System Utilized Preoxygenation: Pre-oxygenation with 100% oxygen Induction Type: IV induction and Rapid sequence Laryngoscope Size: Mac and 4 Grade View: Grade I Tube type: Oral Tube size: 7.5 mm Number of attempts: 1 Airway Equipment and Method: Stylet and Oral airway Placement Confirmation: ETT inserted through vocal cords under direct vision,  positive ETCO2 and breath sounds checked- equal and bilateral Secured at: 23 cm Tube secured with: Tape Dental Injury: Teeth and Oropharynx as per pre-operative assessment

## 2018-04-17 NOTE — Telephone Encounter (Signed)
Please advise 

## 2018-04-17 NOTE — Procedures (Signed)
PATIENT'S NAME:  Travis [name], Travis [name] DOB:      04-Jul-1950      MR#:    [id number]     DATE OF RECORDING: 04/10/2018 REFERRING M.D.:  Greer Pickerel, MD Study Performed:   Baseline Polysomnogram HISTORY: 02-25-2018 [name], who prefers to go by his nickname Travis [name], is a 67 year old patient established with Dr. Greer Pickerel who had previously performed a hernia repair on him.  His orthopedic surgeon is Dr. Louanne Skye, his cardiologist is Dr Aundra Dubin and, his psychiatrist is Dr. Adele Schilder.  He is interested in a sleeve gastrectomy which also should overall improve his health.   He has chronic pain and is on chronic narcotics managed by his PCP and orthopedist.  Weight loss by diet have been unsuccessful, at least not sustained. Had BKA and non -healing wounds, PAD and PVD.   His comorbidities include hypertension, coronary artery disease with status post multiple stent placements into the native coronary arteries, chronic anticoagulation, chronic pain syndrome, diabetes mellitus type 2 and known obstructive sleep apnea.  He does get short of breath with walking and has paroxysmal nocturnal dyspnea. A Sleep study 15 years ago confirmed the diagnosis of apnea and he was given a CPAP machine but he had not used it in about 10 years now. He had been losing weight 12-10 years ago, and had stopped snoring for a time. The patient endorsed the Epworth Sleepiness Scale at 6/24 points, FSS at 24/63, and Geriatric depression score high at 7/15 points.   The patient's weight 336 pounds with a height of 73 (inches), resulting in a BMI of 44.4 kg/m2.The patient's neck circumference measured 17.8 inches.  CURRENT MEDICATIONS: Abilify, Aspirin, Lipitor, Cogentin, Klonopin, Flexeril, Cymbalta, Hydrodiuril, Norco, IBU, Melatonin, Myrbetriq, fish oil, Protonix, Effient, Lyrica, Desyrel, Nasacort  PROCEDURE:  This is a multichannel digital polysomnogram utilizing the Somnostar 11.2 system.  Electrodes and sensors were applied and monitored  per AASM Specifications.   EEG, EOG, Chin and Limb EMG, were sampled at 200 Hz.  ECG, Snore and Nasal Pressure, Thermal Airflow, Respiratory Effort, CPAP Flow and Pressure, Oximetry was sampled at 50 Hz. Digital video and audio were recorded.      BASELINE STUDY: Lights Out was at 21:02 and Lights On at 05:01.  Total recording time (TRT) was 479.5 minutes, with a total sleep time (TST) of 470.5 minutes.   The patient's sleep latency was 12.5 minutes.  REM latency was 130.5 minutes.  The sleep efficiency was 98.1 %.     SLEEP ARCHITECTURE: WASO (Wake after sleep onset) was 2 minutes.  There were 6.5 minutes in Stage N1, 371.5 minutes Stage N2, 0 minutes Stage N3 and 92.5 minutes in Stage REM.  The percentage of Stage N1 was 1.4%, Stage N2 was 79.%, Stage N3 was 0% and Stage R (REM sleep) was 19.7%.   RESPIRATORY ANALYSIS:  There were a total of 142 respiratory events:  80 obstructive apneas, 62 hypopneas with 0 respiratory event related arousals (RERAs).   The total APNEA/HYPOPNEA INDEX (AHI) was 18.1 /hour.  5 events occurred in REM sleep and 122 events in NREM. The REM AHI was 3.2 /hour, versus a non-REM AHI of 21.7/h.  The patient spent all 470.5 minutes of total sleep time in the supine position. OXYGEN SATURATION & C02:  The Wake baseline 02 saturation was 92%, with the lowest being 84%. Time spent below 89% saturation equaled 43 minutes. AROUSALS /PERIODIC LIMB MOVEMENTS:  The arousals were noted as: 12 were spontaneous, 0 were associated  with PLMs, and 2 were associated with respiratory events. The patient had a total of 0 Periodic Limb Movements.   Audio and video analysis did not show any abnormal or unusual movements, behaviors, phonations or vocalizations.  The patient rested motionless in supine.  Snoring was noted, mild to moderate.  EKG was in keeping with normal sinus rhythm (NSR).  Post-study, the patient indicated that sleep was better than usual.    IMPRESSION:  1. Mild -  moderate Obstructive Sleep Apnea (OSA) without any central elements. AHI of 18.1, not accentuated by REM sleep. 2. Sleep Hypoxemia was prolonged and NREM accentuated.  3. NSR EKG. Intermittent bradycardia   RECOMMENDATIONS:  1. Advise full-night, attended, CPAP titration study to optimize therapy before surgical appointment.  2.  I certify that I have reviewed the entire raw data recording prior to the issuance of this report in accordance with the Standards of Accreditation of the American Academy of Sleep Medicine (AASM)    Larey Seat, MD   04-17-2018  Diplomat, American Board of Psychiatry and Neurology  Diplomat, American Board of [place] Director, [employer] [facility] [employer]

## 2018-04-17 NOTE — Op Note (Signed)
04/17/2018  9:33 AM  PATIENT:  Travis Novak Sr.    PRE-OPERATIVE DIAGNOSIS:  Osteoarthritis Right Hip  POST-OPERATIVE DIAGNOSIS:  Same  PROCEDURE:  RIGHT TOTAL HIP ARTHROPLASTY  SURGEON:  Newt Minion, MD  PHYSICIAN ASSISTANT: April Green. ANESTHESIA:   General  PREOPERATIVE INDICATIONS:  [name]. is a  67 y.o. male with a diagnosis of Osteoarthritis Right Hip who failed conservative measures and elected for surgical management.    The risks benefits and alternatives were discussed with the patient preoperatively including but not limited to the risks of infection, bleeding, nerve injury, cardiopulmonary complications, the need for revision surgery, among others, and the patient was willing to proceed.  OPERATIVE IMPLANTS: [place] carotic components with a 56 mm acetabulum with a +410 degree polyethylene liner with a size 16 high offset collar with a 36 mm -2 femoral head     @ENCIMAGES @  OPERATIVE FINDINGS: Stable total hip arthroplasty after reduction.  OPERATIVE PROCEDURE: Patient was brought the operating room and underwent a general anesthetic.  After adequate levels anesthesia were obtained patient's was placed in the left lateral decubitus position with the right side up the right lower extremity was prepped using DuraPrep draped in the sterile field with Ioban was used to cover all exposed skin.  Patient did have a rash in the groin area and this was draped out of the sterile field.  A timeout was called.  A posterior lateral incision was made this was carried down to the tensor fascia lata which was split.  The piriformis and short external rotators were reflected off the femur.  The ball was unable to be dislocated from the acetabulum and the femoral neck cut was made first the head was delivered.  The femoral neck cut was then refreshed and 1 cm proximal to the calcar.  Attention was first focused on the femur with 20 degrees of anteversion this was  sequentially broached to a size 16 mm Karaya stem.  Attention was then focused on the acetabulum.  Retractors were placed and the acetabulum was sequentially reamed up to 55 mm for 56 mm acetabulum this was inserted at 45 degrees of abduction and 20 degrees of anteversion.  This had a good fit a 10 degree +4 polyethylene liner was placed.  Attention was then focused on the femur the femoral component was then inserted to 20 degrees of anteversion the -2 ball was placed this was reduced the hip was placed to a full range of motion and the hip was stable.  The wound was irrigated with normal saline electrocautery was used for hemostasis.  The short external rotators were repaired using #1 Vicryl the deep and subcutaneous fascia were closed using #1 Vicryl skin was closed using 0 Vicryl staples were applied a Praveena wound VAC was applied.  Patient was extubated taken the PACU in stable condition.   DISCHARGE PLANNING:  Antibiotic duration: 24 hours  Weightbearing: Weightbearing as tolerated with his prosthesis.  Pain medication: High-dose opioid pathway.  Dressing care/ Wound VAC: Continue wound VAC for at least 1 week.  Ambulatory devices: Walker.  Discharge to: Skilled nursing facility.  Follow-up: In the office 1 week post operative.

## 2018-04-17 NOTE — Addendum Note (Signed)
Addended by: Larey Seat on: 04/17/2018 06:16 PM   Modules accepted: Orders

## 2018-04-18 ENCOUNTER — Encounter (HOSPITAL_COMMUNITY): Payer: Self-pay | Admitting: General Practice

## 2018-04-18 ENCOUNTER — Other Ambulatory Visit: Payer: Self-pay

## 2018-04-18 LAB — BASIC METABOLIC PANEL
Anion gap: 8 (ref 5–15)
BUN: 18 mg/dL (ref 8–23)
CALCIUM: 8 mg/dL — AB (ref 8.9–10.3)
CO2: 24 mmol/L (ref 22–32)
CREATININE: 1.22 mg/dL (ref 0.61–1.24)
Chloride: 103 mmol/L (ref 98–111)
GFR calc non Af Amer: >60 mL/min (ref >60)
Glucose, Bld: 143 mg/dL — ABNORMAL HIGH (ref 70–99)
Potassium: 3.4 mmol/L — ABNORMAL LOW (ref 3.5–5.1)
Sodium: 135 mmol/L (ref 135–145)

## 2018-04-18 LAB — CBC
HCT: 30.6 % — ABNORMAL LOW (ref 39.0–52.0)
HEMOGLOBIN: 9.6 g/dL — AB (ref 13.0–17.0)
MCH: 28 pg (ref 26.0–34.0)
MCHC: 31.4 g/dL (ref 30.0–36.0)
MCV: 89.2 fL (ref 80.0–100.0)
PLATELETS: 217 10*3/uL (ref 150–400)
RBC: 3.43 MIL/uL — ABNORMAL LOW (ref 4.22–5.81)
RDW: 12.8 % (ref 11.5–15.5)
WBC: 8.9 10*3/uL (ref 4.0–10.5)
nRBC: 0 % (ref 0.0–0.2)

## 2018-04-18 MED ORDER — INFLUENZA VAC SPLIT HIGH-DOSE 0.5 ML IM SUSY
0.5000 mL | PREFILLED_SYRINGE | INTRAMUSCULAR | Status: DC
  Filled 2018-04-18: qty 0.5

## 2018-04-18 NOTE — Evaluation (Signed)
Physical Therapy Evaluation Patient Details Name: Travis GAULIN Sr. MRN: [id number] DOB: [birth year] Today's Date: 04/18/2018   History of Present Illness  Patient is a 67 y/o male who presents s/p Rt THA. PMH includes RT BKA, DM, bil TKA, Hep C, fibromyalgia, CAd.  Clinical Impression  Patient presents with pain and post surgical deficits s/p above surgery. Pt lives with niece and nephew and was walking with [facility] and prosthesis household distances PTA. Limited mainly by pain. Tolerated bed mobility and standing today with Mod A for balance/safety. Prosthesis did not fit secondary to swelling in RLE-attempted to donn with liner but not able to click into prosthesis. Donned shrinker to help with the swelling post session- only able to get it on halfway. Education re: exercises, positioning, precautions, edema management. Would benefit from SNF to maximize independence and mobility prior to return home. Will follow acutely.    Follow Up Recommendations SNF;Supervision for mobility/OOB    Equipment Recommendations  Other (comment)(defer to next venue)    Recommendations for Other Services       Precautions / Restrictions Precautions Precautions: Posterior Hip;Fall Precaution Booklet Issued: No Required Braces or Orthoses: Other Brace Other Brace: wears prosthesis Restrictions Weight Bearing Restrictions: Yes RLE Weight Bearing: Weight bearing as tolerated      Mobility  Bed Mobility Overal bed mobility: Needs Assistance Bed Mobility: Supine to Sit;Sit to Supine     Supine to sit: Mod assist;HOB elevated Sit to supine: Mod assist;HOB elevated   General bed mobility comments: Assist with trunk and to scoot bottom to EOB, increased time. Assist to bring LEs into bed to return to supine.  Transfers Overall transfer level: Needs assistance Equipment used: Rolling walker (2 wheeled) Transfers: Sit to/from Stand Sit to Stand: Mod assist;From elevated surface          General transfer comment: Assist to power to standing with cues for hand placement/technique. Limited as pt's prosthesis does not fit due to swelling so unable tot ake steps today.  Ambulation/Gait             General Gait Details: Deferred  Stairs            Wheelchair Mobility    Modified Rankin (Stroke Patients Only)       Balance Overall balance assessment: Needs assistance Sitting-balance support: Feet supported;No upper extremity supported Sitting balance-Leahy Scale: Fair Sitting balance - Comments: Attempted to lean over to donn prosthesis but reported cramps in abdomen, total A to donn.   Standing balance support: During functional activity;Bilateral upper extremity supported Standing balance-Leahy Scale: Poor Standing balance comment: Reliant on BUEs for support in standing, unable to fully extend RLE.                             Pertinent Vitals/Pain Pain Assessment: Faces Faces Pain Scale: Hurts even more Pain Location: right hip with movement Pain Descriptors / Indicators: Sore;Operative site guarding;Aching;Discomfort;Grimacing;Guarding Pain Intervention(s): Monitored during session;Repositioned;Premedicated before session;Limited activity within patient's tolerance    Home Living Family/patient expects to be discharged to:: Skilled nursing facility                 Additional Comments: typcially lives with niece and nephew.    Prior Function Level of Independence: Independent with assistive device(s)         Comments: Uses SPC for household ambulation. Family assists with IADls.     Hand Dominance   Dominant Hand: Right  Extremity/Trunk Assessment   Upper Extremity Assessment Upper Extremity Assessment: Defer to OT evaluation    Lower Extremity Assessment Lower Extremity Assessment: RLE deficits/detail;Generalized weakness RLE Deficits / Details: Swelling present; not able to don shrinker all the way due to  swelling. Able to perform SLR. RLE Sensation: WNL       Communication   Communication: No difficulties  Cognition Arousal/Alertness: Awake/alert Behavior During Therapy: WFL for tasks assessed/performed Overall Cognitive Status: Within Functional Limits for tasks assessed                                        General Comments      Exercises General Exercises - Lower Extremity Quad Sets: Strengthening;Right;10 reps;Supine   Assessment/Plan    PT Assessment Patient needs continued PT services  PT Problem List Decreased strength;Decreased mobility;Obesity;Pain;Decreased balance;Decreased range of motion;Decreased skin integrity       PT Treatment Interventions Functional mobility training;Balance training;Patient/family education;Gait training;Therapeutic activities;Therapeutic exercise;Wheelchair mobility training    PT Goals (Current goals can be found in the Care Plan section)  Acute Rehab PT Goals Patient Stated Goal: to go to rehab PT Goal Formulation: With patient Time For Goal Achievement: 05/02/18 Potential to Achieve Goals: Good    Frequency Min 3X/week   Barriers to discharge Decreased caregiver support;Inaccessible home environment stairs - 7    Co-evaluation               AM-PAC PT "6 Clicks" Mobility  Outcome Measure Help needed turning from your back to your side while in a flat bed without using bedrails?: A Little Help needed moving from lying on your back to sitting on the side of a flat bed without using bedrails?: A Lot Help needed moving to and from a bed to a chair (including a wheelchair)?: Total Help needed standing up from a chair using your arms (e.g., wheelchair or bedside chair)?: A Lot Help needed to walk in hospital room?: Total Help needed climbing 3-5 steps with a railing? : Total 6 Click Score: 10    End of Session Equipment Utilized During Treatment: Gait belt Activity Tolerance: Patient tolerated treatment  well Patient left: in bed;with call bell/phone within reach;with bed alarm set;Other (comment)(shrinker donned) Nurse Communication: Mobility status PT Visit Diagnosis: Pain;Difficulty in walking, not elsewhere classified (R26.2);Muscle weakness (generalized) (M62.81);Unsteadiness on feet (R26.81) Pain - Right/Left: Right Pain - part of body: Hip    Time: 0852-0922 PT Time Calculation (min) (ACUTE ONLY): 30 min   Charges:   PT Evaluation $PT Eval Moderate Complexity: 1 Mod PT Treatments $Therapeutic Activity: 8-22 mins        Wray Kearns, PT, DPT Acute Rehabilitation Services Pager [phone] Office [place] 04/18/2018, 10:37 AM

## 2018-04-18 NOTE — Plan of Care (Signed)
  Problem: Pain Managment: Goal: General experience of comfort will improve Outcome: Progressing   Problem: Safety: Goal: Ability to remain free from injury will improve Outcome: Progressing   Problem: Skin Integrity: Goal: Risk for impaired skin integrity will decrease Outcome: Progressing   Problem: Activity: Goal: Risk for activity intolerance will decrease Outcome: Progressing   Problem: Clinical Measurements: Goal: Ability to maintain clinical measurements within normal limits will improve Outcome: Progressing

## 2018-04-18 NOTE — Progress Notes (Addendum)
Patient ID: Travis [name]., male   DOB: 07-14-1950, 67 y.o.   MRN: [id number] Postoperative day 1 right total hip arthroplasty.  Patient without complaints this morning.  Plan for with physical therapy weightbearing as tolerated on the right.  Patient was given instructions that he is to wear his black stump shrinker at all times when he is not wearing his leg.  Patient is to wear his leg for gait training.  The wound VAC is functioning well.  Patient will need discharge to skilled nursing.

## 2018-04-19 ENCOUNTER — Encounter (HOSPITAL_COMMUNITY): Payer: Self-pay | Admitting: Orthopedic Surgery

## 2018-04-19 LAB — BASIC METABOLIC PANEL
ANION GAP: 9 (ref 5–15)
BUN: 13 mg/dL (ref 8–23)
CO2: 24 mmol/L (ref 22–32)
Calcium: 8.1 mg/dL — ABNORMAL LOW (ref 8.9–10.3)
Chloride: 99 mmol/L (ref 98–111)
Creatinine, Ser: 1.04 mg/dL (ref 0.61–1.24)
Glucose, Bld: 127 mg/dL — ABNORMAL HIGH (ref 70–99)
POTASSIUM: 3 mmol/L — AB (ref 3.5–5.1)
SODIUM: 132 mmol/L — AB (ref 135–145)

## 2018-04-19 LAB — CBC
HEMATOCRIT: 30.2 % — AB (ref 39.0–52.0)
HEMOGLOBIN: 10 g/dL — AB (ref 13.0–17.0)
MCH: 29.2 pg (ref 26.0–34.0)
MCHC: 33.1 g/dL (ref 30.0–36.0)
MCV: 88 fL (ref 80.0–100.0)
PLATELETS: 174 10*3/uL (ref 150–400)
RBC: 3.43 MIL/uL — ABNORMAL LOW (ref 4.22–5.81)
RDW: 12.5 % (ref 11.5–15.5)
WBC: 8.6 10*3/uL (ref 4.0–10.5)
nRBC: 0 % (ref 0.0–0.2)

## 2018-04-19 NOTE — NC FL2 (Signed)
[place] LEVEL OF CARE SCREENING TOOL     IDENTIFICATION  Patient Name: Travis CHAM Sr. Birthdate: 16-Feb-1951 Sex: male Admission Date (Current Location): 04/17/2018  [facility] and [place] Number:      Facility and Address:  [facility] [place] [address] [place], [place] [address]      Provider Number: [id number]  Attending Physician Name and Address:  Newt Minion, MD  Relative Name and Phone Number:  Delfino Lovett (brother) [phone]    Current Level of Care: Hospital Recommended Level of Care: [place] Prior Approval Number:    Date Approved/Denied:   PASRR Number: [id number]  Discharge Plan: SNF    Current Diagnoses: Patient Active Problem List   Diagnosis Date Noted  . Status post THR (total hip replacement) 04/17/2018  . Unilateral primary osteoarthritis, right hip   . Morbid obesity with BMI of 40.0-44.9, adult ([place]) 02/28/2018  . Myocardial infarction ([place]) 02/25/2018  . Coronary artery disease involving native coronary artery of native heart 02/25/2018  . PAD (peripheral artery disease) ([place]) 02/25/2018  . S/P BKA (below knee amputation) unilateral, right ([place]) 02/25/2018  . Sacroiliitis ([place]) 02/25/2018  . SOB (shortness of breath) 01/03/2018  . Acute on chronic heart failure ([place]) 01/03/2018  . Severe right groin pain 12/20/2017  . Morbid obesity ([place]) 10/09/2017  . Exposure to sexually transmitted disease (STD) 07/14/2017  . Low back pain 07/13/2017  . Leukoplakia, tongue 01/26/2017  . Acute sinus infection 10/20/2016  . Hyperglycemia 10/20/2016  . Skin lesion 10/20/2016  . S/P unilateral BKA (below knee amputation), right ([place]) 06/14/2016  . Charcot foot due to diabetes mellitus ([place])   . Charcot's arthropathy associated with type 2 diabetes mellitus ([place]) 04/11/2016  . Preventative health care 11/05/2015  . Major depression 09/13/2015  . S/P TKR (total knee replacement) bilaterally  09/13/2015  . GERD (gastroesophageal reflux disease) 09/08/2015  . S/P laparoscopic hernia repair 05/11/2015  . PPD positive 04/08/2015  . Benign neoplasm of descending colon   . Benign neoplasm of cecum   . AKI (acute kidney injury) ([place]) 10/18/2014  . Acute blood loss anemia   . Chronic anticoagulation   . Occult blood positive stool 10/17/2014  . General weakness 07/14/2014  . Urinary incontinence 07/14/2014  . Headache(784.0) 10/15/2013  . Spinal stenosis in cervical region 09/26/2013    Class: Chronic  . Spinal stenosis, lumbar region, with neurogenic claudication 09/26/2013    Class: Chronic  . Hand joint pain 06/10/2013  . Rotator cuff tear arthropathy of both shoulders 06/10/2013  . Skin lesion of cheek 05/01/2013  . Pain of right thumb 04/03/2013  . Balance disorder 03/12/2013  . Gait disorder 03/12/2013  . Tremor 03/12/2013  . Left hip pain 03/12/2013  . Pre-ulcerative corn or callous 02/06/2013  . Anxiety 11/12/2011  . OSA (obstructive sleep apnea) 11/07/2011  . Bradycardia 10/20/2011  . Insomnia 10/04/2011  . Obesity 01/12/2011  . ERECTILE DYSFUNCTION, ORGANIC 05/30/2010  . [place] DISEASE, LUMBAR 04/19/2010  . SCIATICA, LEFT 04/19/2010  . Chronic pain syndrome 10/27/2009  . Hyperlipidemia 07/15/2009  . Essential hypertension 06/24/2009  . Coronary artery disease involving native coronary artery of native heart without angina pectoris 06/24/2009  . Allergic rhinitis 06/24/2009  . URETHRAL STRICTURE 06/24/2009  . DEGENERATIVE JOINT DISEASE 06/24/2009  . SHOULDER PAIN, BILATERAL 06/24/2009  . FATIGUE 06/24/2009  . NEPHROLITHIASIS, HX OF 06/24/2009    Orientation RESPIRATION BLADDER Height & Weight     Self, Time, Situation,  Place  Normal Continent Weight:   Height:     BEHAVIORAL SYMPTOMS/MOOD NEUROLOGICAL BOWEL NUTRITION STATUS      Continent Diet(see discharge summary)  AMBULATORY STATUS COMMUNICATION OF NEEDS Skin   Extensive Assist Verbally Surgical  wounds, Wound Vac(right hip and heel closed surgical incision, right hip neg pressure wound, wound vac )                       Personal Care Assistance Level of Assistance  Bathing, Feeding, Dressing, Total care Bathing Assistance: Maximum assistance Feeding assistance: Independent Dressing Assistance: Maximum assistance Total Care Assistance: Maximum assistance   Functional Limitations Info  Sight, Hearing, Speech Sight Info: Adequate Hearing Info: Adequate Speech Info: Adequate    SPECIAL CARE FACTORS FREQUENCY  PT (By licensed PT), OT (By licensed OT)     PT Frequency: min 5x weekly OT Frequency: min 3x weekly            Contractures Contractures Info: Not present    Additional Factors Info  Code Status, Allergies Code Status Info: full Allergies Info: no known           Current Medications (04/19/2018):  This is the current hospital active medication list Current Facility-Administered Medications  Medication Dose Route Frequency Provider Last Rate Last Dose  . 0.9 %  sodium chloride infusion   Intravenous Continuous Newt Minion, MD 10 mL/hr at 04/17/18 1745    . acetaminophen (TYLENOL) tablet 325-650 mg  325-650 mg Oral Q6H PRN Newt Minion, MD   650 mg at 04/18/18 0005  . alum & mag hydroxide-simeth (MAALOX/MYLANTA) 200-200-20 MG/5ML suspension 30 mL  30 mL Oral Q4H PRN Newt Minion, MD      . ARIPiprazole (ABILIFY) tablet 5 mg  5 mg Oral Daily Newt Minion, MD   5 mg at 04/19/18 0917  . aspirin EC tablet 325 mg  325 mg Oral Q breakfast Newt Minion, MD   325 mg at 04/19/18 1062  . atorvastatin (LIPITOR) tablet 20 mg  20 mg Oral Daily Newt Minion, MD   20 mg at 04/19/18 0917  . benztropine (COGENTIN) tablet 0.5 mg  0.5 mg Oral QHS Newt Minion, MD   0.5 mg at 04/17/18 2112  . bisacodyl (DULCOLAX) EC tablet 5 mg  5 mg Oral Daily PRN Newt Minion, MD      . clonazePAM [place]) tablet 0.5 mg  0.5 mg Oral QHS Newt Minion, MD   0.5 mg at  04/18/18 2115  . diphenhydrAMINE (BENADRYL) 12.5 MG/5ML elixir 12.5-25 mg  12.5-25 mg Oral Q4H PRN Newt Minion, MD      . docusate sodium (COLACE) capsule 100 mg  100 mg Oral BID Newt Minion, MD   100 mg at 04/19/18 0917  . DULoxetine (CYMBALTA) DR capsule 30 mg  30 mg Oral BID Newt Minion, MD   30 mg at 04/19/18 0917  . ferrous sulfate tablet 325 mg  325 mg Oral TID PC Newt Minion, MD   325 mg at 04/19/18 6948  . gabapentin (NEURONTIN) capsule 800 mg  800 mg Oral TID Newt Minion, MD   800 mg at 04/19/18 0917  . hydrochlorothiazide (HYDRODIURIL) tablet 25 mg  25 mg Oral Daily Newt Minion, MD   25 mg at 04/19/18 0918  . HYDROmorphone (DILAUDID) injection 0.5-1 mg  0.5-1 mg Intravenous Q4H PRN Newt Minion, MD      .  Influenza vac split quadrivalent PF (FLUZONE HIGH-DOSE) injection 0.5 mL  0.5 mL Intramuscular Tomorrow-1000 Newt Minion, MD      . magnesium citrate solution 1 Bottle  1 Bottle Oral Once PRN Newt Minion, MD      . menthol-cetylpyridinium (CEPACOL) lozenge 3 mg  1 lozenge Oral PRN Newt Minion, MD       Or  . phenol (CHLORASEPTIC) mouth spray 1 spray  1 spray Mouth/Throat PRN Newt Minion, MD      . methocarbamol (ROBAXIN) tablet 500 mg  500 mg Oral Q6H PRN Newt Minion, MD   500 mg at 04/19/18 8337   Or  . methocarbamol (ROBAXIN) 500 mg in dextrose 5 % 50 mL IVPB  500 mg Intravenous Q6H PRN Newt Minion, MD      . metoCLOPramide (REGLAN) tablet 5-10 mg  5-10 mg Oral Q8H PRN Newt Minion, MD       Or  . metoCLOPramide (REGLAN) injection 5-10 mg  5-10 mg Intravenous Q8H PRN Newt Minion, MD      . mirabegron ER [facility]) tablet 50 mg  50 mg Oral QHS Newt Minion, MD   50 mg at 04/18/18 2115  . nystatin (MYCOSTATIN/NYSTOP) topical powder   Topical TID Newt Minion, MD      . ondansetron [facility]) tablet 4 mg  4 mg Oral Q6H PRN Newt Minion, MD       Or  . ondansetron [facility]) injection 4 mg  4 mg Intravenous Q6H PRN Newt Minion, MD       . oxyCODONE (Oxy IR/ROXICODONE) immediate release tablet 10-15 mg  10-15 mg Oral Q4H PRN Newt Minion, MD   10 mg at 04/19/18 0918  . oxyCODONE (Oxy IR/ROXICODONE) immediate release tablet 5-10 mg  5-10 mg Oral Q4H PRN Newt Minion, MD   10 mg at 04/18/18 0409  . pantoprazole (PROTONIX) EC tablet 40 mg  40 mg Oral Daily Newt Minion, MD   40 mg at 04/18/18 0841  . polyethylene glycol (MIRALAX / GLYCOLAX) packet 17 g  17 g Oral Daily PRN Newt Minion, MD      . prasugrel (EFFIENT) tablet 5 mg  5 mg Oral Daily Newt Minion, MD   5 mg at 04/18/18 0846  . traZODone (DESYREL) tablet 150 mg  150 mg Oral QHS Newt Minion, MD   150 mg at 04/18/18 2115     Discharge Medications: Please see discharge summary for a list of discharge medications.  Relevant Imaging Results:  Relevant Lab Results:   Additional Information SSN: [id number]  Alberteen Sam, LCSW

## 2018-04-19 NOTE — Progress Notes (Signed)
Physical Therapy Treatment Patient Details Name: Travis DEWAN Sr. MRN: [id number] DOB: Apr 26, 1951 Today's Date: 04/19/2018    History of Present Illness Patient is a 67 y/o male who presents s/p Rt THA. PMH includes RT BKA, DM, bil TKA, Hep C, fibromyalgia, CAd.    PT Comments    Patient seen for mobility progression. This session focused on sit to stand transfers and bed mobility. Pt is able to stand X3 trials from elevated surface with mod/max A +2. Unable to progress to gait training this session due to inability to don prosthesis given pt's R LE swelling. Continue to progress as tolerated with anticipated d/c to SNF for further skilled PT services.    Follow Up Recommendations  SNF;Supervision for mobility/OOB     Equipment Recommendations  Other (comment)(defer to next venue)    Recommendations for Other Services       Precautions / Restrictions Precautions Precautions: Posterior Hip;Fall Required Braces or Orthoses: Other Brace Other Brace: wears prosthesis Restrictions Weight Bearing Restrictions: Yes RLE Weight Bearing: Weight bearing as tolerated    Mobility  Bed Mobility Overal bed mobility: Needs Assistance Bed Mobility: Supine to Sit;Sit to Supine     Supine to sit: Mod assist;HOB elevated;+2 for safety/equipment Sit to supine: +2 for physical assistance;Max assist   General bed mobility comments: assist at [place] and trunk; cues for sequencing; use of rail; pt has limited shoulder ROM especially on R side  Transfers Overall transfer level: Needs assistance Equipment used: Rolling walker (2 wheeled)(bariatric) Transfers: Sit to/from Stand Sit to Stand: From elevated surface;Max assist;Mod assist;+2 safety/equipment         General transfer comment: max A +2 intial trial and mod A +2 next 2 trials; unable to don prosthesis due to R LE swelling; cues for safe hand placement; assist to power up and to gain balance  Ambulation/Gait              General Gait Details: unable to progress to gait training this session due to inability to don prosthesis   Stairs             Wheelchair Mobility    Modified Rankin (Stroke Patients Only)       Balance Overall balance assessment: Needs assistance Sitting-balance support: Feet supported;No upper extremity supported Sitting balance-Leahy Scale: Fair     Standing balance support: During functional activity;Bilateral upper extremity supported Standing balance-Leahy Scale: Poor                              Cognition Arousal/Alertness: Awake/alert Behavior During Therapy: WFL for tasks assessed/performed Overall Cognitive Status: Within Functional Limits for tasks assessed                                        Exercises      General Comments        Pertinent Vitals/Pain Pain Assessment: 0-10 Faces Pain Scale: Hurts even more Pain Location: right hip with movement Pain Descriptors / Indicators: Sore;Grimacing;Guarding Pain Intervention(s): Limited activity within patient's tolerance;Monitored during session;Repositioned;RN gave pain meds during session;Patient requesting pain meds-RN notified    Home Living                      Prior Function            PT Goals (current  goals can now be found in the care plan section) Acute Rehab PT Goals Patient Stated Goal: to go to rehab Progress towards PT goals: Progressing toward goals    Frequency    Min 3X/week      PT Plan Current plan remains appropriate    Co-evaluation              AM-PAC PT "6 Clicks" Mobility   Outcome Measure  Help needed turning from your back to your side while in a flat bed without using bedrails?: A Little Help needed moving from lying on your back to sitting on the side of a flat bed without using bedrails?: A Lot Help needed moving to and from a bed to a chair (including a wheelchair)?: Total Help needed standing up from a  chair using your arms (e.g., wheelchair or bedside chair)?: A Lot Help needed to walk in hospital room?: Total Help needed climbing 3-5 steps with a railing? : Total 6 Click Score: 10    End of Session Equipment Utilized During Treatment: Gait belt Activity Tolerance: Patient tolerated treatment well Patient left: in bed;with call bell/phone within reach Nurse Communication: Mobility status PT Visit Diagnosis: Pain;Difficulty in walking, not elsewhere classified (R26.2);Muscle weakness (generalized) (M62.81);Unsteadiness on feet (R26.81) Pain - Right/Left: Right Pain - part of body: Hip     Time: 8144-8185 PT Time Calculation (min) (ACUTE ONLY): 36 min  Charges:  $Therapeutic Activity: 23-37 mins                     Earney Navy, PTA Acute Rehabilitation Services Pager: [phone] Office: [phone]     Darliss Cheney 04/19/2018, 1:26 PM

## 2018-04-19 NOTE — Progress Notes (Signed)
Patient ID: Travis [name]., male   DOB: Jul 11, 1950, 67 y.o.   MRN: [id number] Patient sitting up in bed to work with physical therapy.  He does have swelling in the right transtibial amputation and cannot fit his prosthesis at this time.  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  Anticipate discharge to skilled nursing on Monday.

## 2018-04-20 LAB — CBC
HCT: 27.7 % — ABNORMAL LOW (ref 39.0–52.0)
Hemoglobin: 9.1 g/dL — ABNORMAL LOW (ref 13.0–17.0)
MCH: 28.8 pg (ref 26.0–34.0)
MCHC: 32.9 g/dL (ref 30.0–36.0)
MCV: 87.7 fL (ref 80.0–100.0)
NRBC: 0 % (ref 0.0–0.2)
Platelets: 176 10*3/uL (ref 150–400)
RBC: 3.16 MIL/uL — ABNORMAL LOW (ref 4.22–5.81)
RDW: 12.5 % (ref 11.5–15.5)
WBC: 7.4 10*3/uL (ref 4.0–10.5)

## 2018-04-20 LAB — BASIC METABOLIC PANEL
ANION GAP: 12 (ref 5–15)
BUN: 11 mg/dL (ref 8–23)
CO2: 26 mmol/L (ref 22–32)
Calcium: 8.1 mg/dL — ABNORMAL LOW (ref 8.9–10.3)
Chloride: 95 mmol/L — ABNORMAL LOW (ref 98–111)
Creatinine, Ser: 1.06 mg/dL (ref 0.61–1.24)
GFR calc Af Amer: >60 mL/min (ref >60)
GFR calc non Af Amer: >60 mL/min (ref >60)
Glucose, Bld: 119 mg/dL — ABNORMAL HIGH (ref 70–99)
POTASSIUM: 3.2 mmol/L — AB (ref 3.5–5.1)
Sodium: 133 mmol/L — ABNORMAL LOW (ref 135–145)

## 2018-04-20 NOTE — Progress Notes (Signed)
Subjective: 3 Days Post-Op Procedure(s) (LRB): RIGHT TOTAL HIP ARTHROPLASTY (Right) Patient reports pain as mild.  Feeling pretty good this am.    Objective: Vital signs in last 24 hours: Temp:  [99.6 F (37.6 C)-100.6 F (38.1 C)] 99.9 F (37.7 C) (11/30 0407) Pulse Rate:  [74-79] 74 (11/30 0407) Resp:  [16-18] 18 (11/30 0407) BP: (106-131)/(57-58) 131/58 (11/30 0407) SpO2:  [94 %-98 %] 94 % (11/30 0407)  Intake/Output from previous day: 11/29 0701 - 11/30 0700 In: 450 [P.O.:450] Out: 610 [Urine:600; Drains:10] Intake/Output this shift: No intake/output data recorded.  Recent Labs    04/18/18 0210 04/19/18 0200 04/20/18 0503  HGB 9.6* 10.0* 9.1*   Recent Labs    04/19/18 0200 04/20/18 0503  WBC 8.6 7.4  RBC 3.43* 3.16*  HCT 30.2* 27.7*  PLT 174 176   Recent Labs    04/19/18 0200 04/20/18 0503  NA 132* 133*  K 3.0* 3.2*  CL 99 95*  CO2 24 26  BUN 13 11  CREATININE 1.04 1.06  GLUCOSE 127* 119*  CALCIUM 8.1* 8.1*   No results for input(s): LABPT, INR in the last 72 hours.  Neurologically intact Incision: dressing C/D/I  Wound vac in place and draining blood    Assessment/Plan: 3 Days Post-Op Procedure(s) (LRB): RIGHT TOTAL HIP ARTHROPLASTY (Right) Up with therapy  WBAT RLE with prosthesis (patient now has stump shrinker) Anticipate d/c to SNF [place] [facility] 04/20/2018, 8:14 AM

## 2018-04-20 NOTE — Plan of Care (Signed)

## 2018-04-20 NOTE — Progress Notes (Signed)
Patient requested to receive influenza vaccine. Vaccine was sent up 11/29, but had been sitting out at room temp. Called pharmacy & they advised to discard and they would send a new one up.

## 2018-04-21 NOTE — Plan of Care (Signed)
  Problem: Health Behavior/Discharge Planning: Goal: Ability to manage health-related needs will improve Outcome: Progressing   Problem: Clinical Measurements: Goal: Will remain free from infection Outcome: Progressing   Problem: Pain Managment: Goal: General experience of comfort will improve Outcome: Progressing   Problem: Safety: Goal: Ability to remain free from injury will improve Outcome: Progressing   Problem: Skin Integrity: Goal: Risk for impaired skin integrity will decrease Outcome: Progressing

## 2018-04-21 NOTE — Plan of Care (Signed)
  Problem: Nutrition: Goal: Adequate nutrition will be maintained Outcome: Progressing   Problem: Coping: Goal: Level of anxiety will decrease Outcome: Progressing   Problem: Elimination: Goal: Will not experience complications related to bowel motility Outcome: Progressing   Problem: Elimination: Goal: Will not experience complications related to urinary retention Outcome: Progressing   Problem: Pain Managment: Goal: General experience of comfort will improve Outcome: Progressing   Problem: Safety: Goal: Ability to remain free from injury will improve Outcome: Progressing   Problem: Skin Integrity: Goal: Risk for impaired skin integrity will decrease Outcome: Progressing

## 2018-04-21 NOTE — Plan of Care (Signed)

## 2018-04-22 ENCOUNTER — Telehealth: Payer: Self-pay | Admitting: Neurology

## 2018-04-22 MED ORDER — OXYCODONE-ACETAMINOPHEN 5-325 MG PO TABS: 1.0000 | ORAL_TABLET | ORAL | 0 refills | Status: DC | PRN

## 2018-04-22 NOTE — Telephone Encounter (Signed)
I called pt. I advised pt that Dr. Brett Fairy reviewed their sleep study results and found that patient had sleep apnea and recommends that pt be treated with a cpap. Dr. Brett Fairy recommends that pt return for a repeat sleep study in order to properly titrate the cpap and ensure a good mask fit. Pt is agreeable to returning for a titration study. I advised pt that our sleep lab will file with pt's insurance and call pt to schedule the sleep study when we hear back from the pt's insurance regarding coverage of this sleep study. Pt verbalized understanding of results and states sleep lab already called and he is scheduled for 05/05/18. Pt had no questions at this time but was encouraged to call back if questions arise.

## 2018-04-22 NOTE — Plan of Care (Signed)
  Problem: Education: Goal: Knowledge of General Education information will improve Description: Including pain rating scale, medication(s)/side effects and non-pharmacologic comfort measures Outcome: Progressing   Problem: Clinical Measurements: Goal: Ability to maintain clinical measurements within normal limits will improve Outcome: Progressing   

## 2018-04-22 NOTE — Telephone Encounter (Signed)
-----   Message from Larey Seat, MD sent at 04/17/2018  6:16 PM EST ----- Mild OSA, mostly hypopneas, severe hypoxemia- and mostly in NREM sleep- unusual.  CPAP return is indicated, this patient is high risk for central apneas emerging under PAP therapy.   IMPRESSION:  1. Mild - moderate Obstructive Sleep Apnea (OSA) without any  central elements. AHI of 18.1, not accentuated by REM sleep. 2. Sleep Hypoxemia was prolonged and NREM accentuated.  3. NSR EKG. Intermittent bradycardia   RECOMMENDATIONS:  1. Advise full-night, attended, CPAP titration study to optimize  therapy before surgical appointment.

## 2018-04-22 NOTE — Clinical Social Work Note (Addendum)
Clinical Social Work Assessment  Patient Details  Name: Travis LANCE Sr. MRN: [id number] Date of Birth: 03/14/1951  Date of referral:  04/22/18               Reason for consult:  Discharge Planning                Permission sought to share information with:  Case Manager, Facility Sport and exercise psychologist, Family Supports Permission granted to share information::  Yes, Verbal Permission Granted  Name::     Higher education careers adviser::  SNFs  Relationship::  brother  Contact Information:  [phone]  Housing/Transportation Living arrangements for the past 2 months:  [place] of Information:  Patient Patient Interpreter Needed:  None Criminal Activity/Legal Involvement Pertinent to Current Situation/Hospitalization:  No - Comment as needed Significant Relationships:  Siblings Lives with:  Self Do you feel safe going back to the place where you live?  No Need for family participation in patient care:  No (Coment)  Care giving concerns:  CSW received referral for possible SNF placement at time of discharge. Spoke with patient regarding possibility of SNF placement . Patient's  family  is currently unable to care for him at their home given patient's current needs and fall risk.  Patient expressed understanding of PT recommendation and are agreeable to SNF placement at time of discharge. CSW to continue to follow and assist with discharge planning needs.     Social Worker assessment / plan:  Spoke with patient concerning possibility of rehab at SNF before returning home.    Employment status:  Retired Nurse, adult PT Recommendations:  [place] / Referral to community resources:  [place]  Patient/Family's Response to care:  Patient  recognize need for rehab before returning home and are agreeable to a SNF in [place]. They report preference for  [facility]     . CSW explained insurance authorization  process. Patient's family reported that they want patient to get stronger to be able to come back home.    Patient/Family's Understanding of and Emotional Response to Diagnosis, Current Treatment, and Prognosis:  Patient/family is realistic regarding therapy needs and expressed being hopeful for SNF placement. Patient expressed understanding of CSW role and discharge process as well as medical condition. No questions/concerns about plan or treatment.    Emotional Assessment Appearance:  Appears stated age Attitude/Demeanor/Rapport:  Gracious, Self-Confident Affect (typically observed):  Accepting, Adaptable Orientation:  Oriented to Self, Oriented to Place, Oriented to  Time, Oriented to Situation Alcohol / Substance use:  Not Applicable Psych involvement (Current and /or in the community):  No (Comment)  Discharge Needs  Concerns to be addressed:  Discharge Planning Concerns Readmission within the last 30 days:  No Current discharge risk:  Dependent with Mobility Barriers to Discharge:  Continued Medical Work up   [employer], LCSW 04/22/2018, 3:57 PM

## 2018-04-22 NOTE — Progress Notes (Signed)
Patient ID: Travis [name]., male   DOB: [birth year], 67 y.o.   MRN: [id number] Patient is postoperative day 5 status post total hip arthroplasty on the right.  He has not been seen by  Education officer, museum yet for discharge to skilled nursing.  The wound VAC has a air leak.  I will have the wound VAC removed.  No new drainage.  Orders are written for discharge to skilled nursing.

## 2018-04-22 NOTE — Discharge Summary (Signed)
Discharge Diagnoses:  Active Problems:   Unilateral primary osteoarthritis, right hip   Status post THR (total hip replacement)   Surgeries: Procedure(s): RIGHT TOTAL HIP ARTHROPLASTY on 04/17/2018    Consultants:   Discharged Condition: Improved  Hospital Course: Travis [name]. is an 67 y.o. male who was admitted 04/17/2018 with a chief complaint of osteoarthritis right hip, with a final diagnosis of Osteoarthritis Right Hip.  Patient was brought to the operating room on 04/17/2018 and underwent Procedure(s): RIGHT TOTAL HIP ARTHROPLASTY.    Patient was given perioperative antibiotics:  Anti-infectives (From admission, onward)   Start     Dose/Rate Route Frequency Ordered Stop   04/17/18 2330  ceFAZolin (ANCEF) IVPB 2g/100 mL premix     2 g 200 mL/hr over 30 Minutes Intravenous Every 6 hours 04/17/18 1918 04/18/18 0701   04/17/18 1500  ceFAZolin (ANCEF) IVPB 2g/100 mL premix  Status:  Discontinued     2 g 200 mL/hr over 30 Minutes Intravenous Every 6 hours 04/17/18 1416 04/17/18 1918   04/17/18 0630  ceFAZolin (ANCEF) 3 g in dextrose 5 % 50 mL IVPB     3 g 100 mL/hr over 30 Minutes Intravenous To ShortStay Surgical 04/16/18 1202 04/17/18 0800    .  Patient was given sequential compression devices, early ambulation, and aspirin for DVT prophylaxis.  Recent vital signs:  Patient Vitals for the past 24 hrs:  BP Temp Temp src Pulse Resp SpO2  04/22/18 0609 (!) 142/63 99 F (37.2 C) Oral 70 18 98 %  04/21/18 2220 138/61 (!) 100.9 F (38.3 C) Oral 77 18 96 %  04/21/18 1500 (!) 125/57 - - 71 17 92 %  .  Recent laboratory studies: No results found.  Discharge Medications:   Allergies as of 04/22/2018   No Known Allergies     Medication List    STOP taking these medications   HYDROcodone-acetaminophen 10-325 MG tablet Commonly known as:  NORCO     TAKE these medications   ARIPiprazole 5 MG tablet Commonly known as:  ABILIFY Take 1 tablet (5 mg total) by  mouth daily. Take one tablet at bedtime What changed:    when to take this  additional instructions   aspirin 81 MG tablet Take 81 mg by mouth daily.   atorvastatin 20 MG tablet Commonly known as:  LIPITOR Take 1 tablet (20 mg total) by mouth daily.   benztropine 0.5 MG tablet Commonly known as:  COGENTIN Take 1 tablet (0.5 mg total) by mouth at bedtime.   clonazePAM 0.5 MG tablet Commonly known as:  KLONOPIN Take 1 tablet (0.5 mg total) by mouth at bedtime.   cyclobenzaprine 5 MG tablet Commonly known as:  FLEXERIL TAKE ONE TABLET BY MOUTH THREE TIMES DAILY AS NEEDED muscle spasm   DULoxetine 30 MG capsule Commonly known as:  CYMBALTA Take 1 capsule (30 mg total) by mouth 2 (two) times daily. Take one capsule daily for week and than twice daily What changed:  additional instructions   Fish Oil 1000 MG Caps Take 1,000 mg by mouth at bedtime.   gabapentin 800 MG tablet Commonly known as:  NEURONTIN Take 800 mg by mouth 3 (three) times daily.   hydrochlorothiazide 25 MG tablet Commonly known as:  HYDRODIURIL Take 1 tablet (25 mg total) by mouth daily. What changed:  when to take this   Melatonin 5 MG Tabs Take 20 mg by mouth at bedtime.   MYRBETRIQ 50 MG Tb24 tablet Generic drug:  mirabegron ER Take 50 mg by mouth at bedtime.   oxyCODONE-acetaminophen 5-325 MG tablet Commonly known as:  PERCOCET/ROXICET Take 1 tablet by mouth every 4 (four) hours as needed.   pantoprazole 40 MG tablet Commonly known as:  PROTONIX TAKE ONE TABLET BY MOUTH EVERY DAY   prasugrel 5 MG Tabs tablet Commonly known as:  EFFIENT TAKE ONE TABLET BY MOUTH EVERY DAY What changed:  when to take this   traZODone 150 MG tablet Commonly known as:  DESYREL Take 1 tablet (150 mg total) by mouth at bedtime.       Diagnostic Studies: Xr Hip Unilat W Or W/o Pelvis 2-3 Views Right  Result Date: 04/15/2018 2 view radiographs of the right hip shows progressive arthritis patient has  bone-on-bone contact with subchondral sclerosis there is a large bone spur inferiorly on the acetabulum and femoral head.  Patient has an elongated femoral head from the osteoarthritis.   Patient benefited maximally from their hospital stay and there were no complications.     Disposition: Discharge disposition: 03-Skilled Nursing Facility      Discharge Instructions    Call MD / Call 911   Complete by:  As directed    If you experience chest pain or shortness of breath, CALL 911 and be transported to the hospital emergency room.  If you develope a fever above 101 F, pus (white drainage) or increased drainage or redness at the wound, or calf pain, call your surgeon's office.   Constipation Prevention   Complete by:  As directed    Drink plenty of fluids.  Prune juice may be helpful.  You may use a stool softener, such as Colace (over the counter) 100 mg twice a day.  Use MiraLax (over the counter) for constipation as needed.   Diet - low sodium heart healthy   Complete by:  As directed    Increase activity slowly as tolerated   Complete by:  As directed      Follow-up Information    Newt Minion, MD In 1 week.   Specialty:  Orthopedic Surgery Contact information: [place] [address] [phone]            Signed: Newt Minion 04/22/2018, 7:09 AM

## 2018-04-23 NOTE — Progress Notes (Signed)
Patient ID: Travis [name]., male   DOB: 10-12-1950, 67 y.o.   MRN: [id number] Patient wearing new stump shrinker, he states it fits well, no complaints, VAC has been discontinued,awaiting SNF placement

## 2018-04-23 NOTE — Progress Notes (Signed)
Physical Therapy Treatment Patient Details Name: Travis LUNDEN Sr. MRN: [id number] DOB: 1950-12-31 Today's Date: 04/23/2018    History of Present Illness Patient is a 67 y/o male who presents s/p Rt THA. PMH includes RT BKA, DM, bil TKA, Hep C, fibromyalgia, CAd.    PT Comments    Patient seen for mobility progression. Pt eager to mobilize. Pt requires mod A +2 for bed mobility and sit to stand transfers EOB. R LE continues to be too swollen for proper fit of R LE prosthesis (able to stand with prosthesis but not safe for pivoting or gait training). R LE shrinker donned end of session and pt reports he has had it on since receiving it from Dr. Sharol Given. Suspect pt will progress well when able to don prosthesis. Pt encouraged to work on R hip ROM in bed throughout the day for decreased soreness and improved mobility/swelling. Continue to progress as tolerated with anticipated d/c to SNF for further skilled PT services.     Follow Up Recommendations  SNF;Supervision for mobility/OOB     Equipment Recommendations  Other (comment)(defer to next venue)    Recommendations for Other Services       Precautions / Restrictions Precautions Precautions: Posterior Hip;Fall Precaution Booklet Issued: No Required Braces or Orthoses: Other Brace Other Brace: wears R LE prosthesis Restrictions Weight Bearing Restrictions: Yes RLE Weight Bearing: Weight bearing as tolerated    Mobility  Bed Mobility Overal bed mobility: Needs Assistance Bed Mobility: Supine to Sit     Supine to sit: Mod assist;HOB elevated;+2 for physical assistance Sit to supine: Mod assist;+2 for physical assistance;+2 for safety/equipment   General bed mobility comments: assistance required to bring hips to EOB and to elevate trunk into sitting with pt using rail as able to assist; cues for sequencing and technique; pt can assist minimally with R UE to roll to L side due to limited R shoulder ROM  Transfers Overall  transfer level: Needs assistance Equipment used: Rolling walker (2 wheeled)(bariatric) Transfers: Sit to/from Stand Sit to Stand: From elevated surface;Mod assist;+2 physical assistance         General transfer comment: pt stood X 4 from EOB with assistance to power up into standing; R LE prosthetic donned however R LE is still too swollen for prosthetic to fit properly for safe gait training or transfer away from EOB; cues for safe hand placement and posture   Ambulation/Gait             General Gait Details: prosthesis not fitting properly for safe attempt of gait   Stairs             Wheelchair Mobility    Modified Rankin (Stroke Patients Only)       Balance Overall balance assessment: Needs assistance Sitting-balance support: Feet supported;Single extremity supported Sitting balance-Leahy Scale: Fair     Standing balance support: During functional activity;Bilateral upper extremity supported Standing balance-Leahy Scale: Poor                              Cognition Arousal/Alertness: Awake/alert Behavior During Therapy: WFL for tasks assessed/performed Overall Cognitive Status: Within Functional Limits for tasks assessed                                        Exercises      General Comments General  comments (skin integrity, edema, etc.): pt encouraged to continue R hip ROM in bed during the day within posterior precautions      Pertinent Vitals/Pain Pain Assessment: Faces Faces Pain Scale: Hurts even more Pain Location: right hip ROM Pain Descriptors / Indicators: Sore;Grimacing;Guarding Pain Intervention(s): Limited activity within patient's tolerance;Monitored during session;Repositioned;Premedicated before session    Home Living                      Prior Function            PT Goals (current goals can now be found in the care plan section) Acute Rehab PT Goals Patient Stated Goal: to go to  rehab Progress towards PT goals: Progressing toward goals    Frequency    Min 3X/week      PT Plan Current plan remains appropriate    Co-evaluation              AM-PAC PT "6 Clicks" Mobility   Outcome Measure  Help needed turning from your back to your side while in a flat bed without using bedrails?: A Lot Help needed moving from lying on your back to sitting on the side of a flat bed without using bedrails?: A Lot Help needed moving to and from a bed to a chair (including a wheelchair)?: A Lot Help needed standing up from a chair using your arms (e.g., wheelchair or bedside chair)?: A Lot Help needed to walk in hospital room?: A Lot Help needed climbing 3-5 steps with a railing? : Total 6 Click Score: 11    End of Session Equipment Utilized During Treatment: Gait belt Activity Tolerance: Patient tolerated treatment well Patient left: in bed;with call bell/phone within reach(R LE shrinker donned) Nurse Communication: Mobility status PT Visit Diagnosis: Pain;Difficulty in walking, not elsewhere classified (R26.2);Muscle weakness (generalized) (M62.81);Unsteadiness on feet (R26.81) Pain - Right/Left: Right Pain - part of body: Hip     Time: 7681-1572 PT Time Calculation (min) (ACUTE ONLY): 42 min  Charges:  $Therapeutic Activity: 38-52 mins                     Earney Navy, PTA Acute Rehabilitation Services Pager: [phone] Office: [phone]     Darliss Cheney 04/23/2018, 1:02 PM

## 2018-04-23 NOTE — Plan of Care (Signed)

## 2018-04-23 NOTE — Plan of Care (Signed)
  Problem: Education: Goal: Knowledge of General Education information will improve Description Including pain rating scale, medication(s)/side effects and non-pharmacologic comfort measures Outcome: Progressing   Problem: Health Behavior/Discharge Planning: Goal: Ability to manage health-related needs will improve Outcome: Progressing   

## 2018-04-23 NOTE — Care Management Important Message (Signed)
Important Message  Patient Details  Name: Travis [name]. MRN: [id number] Date of Birth: 1951/01/12   Medicare Important Message Given:  Yes    Dagoberto Nealy 04/23/2018, 9:25 AM

## 2018-04-24 ENCOUNTER — Telehealth: Payer: Self-pay | Admitting: *Deleted

## 2018-04-24 DIAGNOSIS — E119 Type 2 diabetes mellitus without complications: Secondary | ICD-10-CM | POA: Diagnosis not present

## 2018-04-24 DIAGNOSIS — Z7401 Bed confinement status: Secondary | ICD-10-CM | POA: Diagnosis not present

## 2018-04-24 DIAGNOSIS — M1611 Unilateral primary osteoarthritis, right hip: Secondary | ICD-10-CM | POA: Diagnosis not present

## 2018-04-24 DIAGNOSIS — F319 Bipolar disorder, unspecified: Secondary | ICD-10-CM | POA: Diagnosis not present

## 2018-04-24 DIAGNOSIS — F3189 Other bipolar disorder: Secondary | ICD-10-CM | POA: Diagnosis not present

## 2018-04-24 DIAGNOSIS — G894 Chronic pain syndrome: Secondary | ICD-10-CM | POA: Diagnosis not present

## 2018-04-24 DIAGNOSIS — Z6841 Body Mass Index (BMI) 40.0 and over, adult: Secondary | ICD-10-CM | POA: Diagnosis not present

## 2018-04-24 DIAGNOSIS — G4733 Obstructive sleep apnea (adult) (pediatric): Secondary | ICD-10-CM | POA: Diagnosis not present

## 2018-04-24 DIAGNOSIS — Z713 Dietary counseling and surveillance: Secondary | ICD-10-CM | POA: Diagnosis not present

## 2018-04-24 DIAGNOSIS — Z955 Presence of coronary angioplasty implant and graft: Secondary | ICD-10-CM | POA: Diagnosis not present

## 2018-04-24 DIAGNOSIS — E785 Hyperlipidemia, unspecified: Secondary | ICD-10-CM | POA: Diagnosis not present

## 2018-04-24 DIAGNOSIS — Z89511 Acquired absence of right leg below knee: Secondary | ICD-10-CM | POA: Diagnosis not present

## 2018-04-24 DIAGNOSIS — M545 Low back pain: Secondary | ICD-10-CM | POA: Diagnosis not present

## 2018-04-24 DIAGNOSIS — M160 Bilateral primary osteoarthritis of hip: Secondary | ICD-10-CM | POA: Diagnosis not present

## 2018-04-24 DIAGNOSIS — Z4789 Encounter for other orthopedic aftercare: Secondary | ICD-10-CM | POA: Diagnosis not present

## 2018-04-24 DIAGNOSIS — E1165 Type 2 diabetes mellitus with hyperglycemia: Secondary | ICD-10-CM | POA: Diagnosis not present

## 2018-04-24 DIAGNOSIS — I739 Peripheral vascular disease, unspecified: Secondary | ICD-10-CM | POA: Diagnosis not present

## 2018-04-24 DIAGNOSIS — M2550 Pain in unspecified joint: Secondary | ICD-10-CM | POA: Diagnosis not present

## 2018-04-24 DIAGNOSIS — Z96641 Presence of right artificial hip joint: Secondary | ICD-10-CM | POA: Diagnosis not present

## 2018-04-24 DIAGNOSIS — Z79899 Other long term (current) drug therapy: Secondary | ICD-10-CM | POA: Diagnosis not present

## 2018-04-24 DIAGNOSIS — R5381 Other malaise: Secondary | ICD-10-CM | POA: Diagnosis not present

## 2018-04-24 DIAGNOSIS — E039 Hypothyroidism, unspecified: Secondary | ICD-10-CM | POA: Diagnosis not present

## 2018-04-24 DIAGNOSIS — I5023 Acute on chronic systolic (congestive) heart failure: Secondary | ICD-10-CM | POA: Diagnosis not present

## 2018-04-24 DIAGNOSIS — I10 Essential (primary) hypertension: Secondary | ICD-10-CM | POA: Diagnosis not present

## 2018-04-24 DIAGNOSIS — I2510 Atherosclerotic heart disease of native coronary artery without angina pectoris: Secondary | ICD-10-CM | POA: Diagnosis not present

## 2018-04-24 DIAGNOSIS — Z7902 Long term (current) use of antithrombotics/antiplatelets: Secondary | ICD-10-CM | POA: Diagnosis not present

## 2018-04-24 NOTE — Progress Notes (Signed)
Discharge instructions sent with PTAR along with a prescription for pain.  Report was called to Maudie Mercury (nurse)  Patient will be discharged via [facility]

## 2018-04-24 NOTE — Progress Notes (Signed)
[facility]) was given a message that the patient left his check book in the bedside table.  The checkbook will be held at the nursing desk for pick up.

## 2018-04-24 NOTE — Clinical Social Work Placement (Signed)
   CLINICAL SOCIAL WORK PLACEMENT  NOTE  Date:  04/24/2018  Patient Details  Name: Travis MCKINNEY Sr. MRN: [id number] Date of Birth: September 18, 1950  Clinical Social Work is seeking post-discharge placement for this patient at the [place] level of care (*CSW will initial, date and re-position this form in  chart as items are completed):      Patient/family provided with [facility] Work Department's list of facilities offering this level of care within the geographic area requested by the patient (or if unable, by the patient's family).  Yes   Patient/family informed of their freedom to choose among providers that offer the needed level of care, that participate in Medicare, Medicaid or managed care program needed by the patient, have an available bed and are willing to accept the patient.      Patient/family informed of [facility]'s ownership interest in [facility] and [facility], as well as of the fact that they are under no obligation to receive care at these facilities.  PASRR submitted to EDS on       PASRR number received on 04/19/18     Existing PASRR number confirmed on       FL2 transmitted to all facilities in geographic area requested by pt/family on 04/19/18     FL2 transmitted to all facilities within larger geographic area on       Patient informed that his/her managed care company has contracts with or will negotiate with certain facilities, including the following:        Yes   Patient/family informed of bed offers received.  Patient chooses bed at [facility]     Physician recommends and patient chooses bed at      Patient to be transferred to [facility] on 04/24/18.  Patient to be transferred to facility by PTAR     Patient family notified on 04/24/18 of transfer.  Name of family member notified:  Delfino Lovett (brother)     PHYSICIAN       Additional Comment:     _______________________________________________ Alberteen Sam, LCSW 04/24/2018, 9:46 AM

## 2018-04-24 NOTE — Discharge Summary (Signed)
Discharge Diagnoses:  Active Problems:   Unilateral primary osteoarthritis, right hip   Status post THR (total hip replacement)   Surgeries: Procedure(s): RIGHT TOTAL HIP ARTHROPLASTY on 04/17/2018    Consultants:   Discharged Condition: Improved  Hospital Course: Travis [name]. is an 67 y.o. male who was admitted 04/17/2018 with a chief complaint of osteoarthritis right hip, with a final diagnosis of Osteoarthritis Right Hip.  Patient was brought to the operating room on 04/17/2018 and underwent Procedure(s): RIGHT TOTAL HIP ARTHROPLASTY.    Patient was given perioperative antibiotics:  Anti-infectives (From admission, onward)   Start     Dose/Rate Route Frequency Ordered Stop   04/17/18 2330  ceFAZolin (ANCEF) IVPB 2g/100 mL premix     2 g 200 mL/hr over 30 Minutes Intravenous Every 6 hours 04/17/18 1918 04/18/18 0701   04/17/18 1500  ceFAZolin (ANCEF) IVPB 2g/100 mL premix  Status:  Discontinued     2 g 200 mL/hr over 30 Minutes Intravenous Every 6 hours 04/17/18 1416 04/17/18 1918   04/17/18 0630  ceFAZolin (ANCEF) 3 g in dextrose 5 % 50 mL IVPB     3 g 100 mL/hr over 30 Minutes Intravenous To [facility]Stay [facility] 04/16/18 1202 04/17/18 0800    .  Patient was given sequential compression devices, early ambulation, and aspirin for DVT prophylaxis.  Recent vital signs:  Patient Vitals for the past 24 hrs:  BP Temp Temp src Pulse Resp SpO2  04/24/18 0441 129/68 98.5 F (36.9 C) Oral 68 14 97 %  04/23/18 1933 (!) 124/58 98.8 F (37.1 C) Oral 71 18 97 %  04/23/18 1407 126/66 98.9 F (37.2 C) Oral 80 20 95 %  04/23/18 0945 (!) 127/57 99.9 F (37.7 C) Oral 71 18 95 %  .  Recent laboratory studies: No results found.  Discharge Medications:   Allergies as of 04/24/2018   No Known Allergies     Medication List    STOP taking these medications   HYDROcodone-acetaminophen 10-325 MG tablet Commonly known as:  NORCO     TAKE these medications   ARIPiprazole  5 MG tablet Commonly known as:  ABILIFY Take 1 tablet (5 mg total) by mouth daily. Take one tablet at bedtime What changed:    when to take this  additional instructions   aspirin 81 MG tablet Take 81 mg by mouth daily.   atorvastatin 20 MG tablet Commonly known as:  LIPITOR Take 1 tablet (20 mg total) by mouth daily.   benztropine 0.5 MG tablet Commonly known as:  COGENTIN Take 1 tablet (0.5 mg total) by mouth at bedtime.   clonazePAM 0.5 MG tablet Commonly known as:  KLONOPIN Take 1 tablet (0.5 mg total) by mouth at bedtime.   cyclobenzaprine 5 MG tablet Commonly known as:  FLEXERIL TAKE ONE TABLET BY MOUTH THREE TIMES DAILY AS NEEDED muscle spasm   DULoxetine 30 MG capsule Commonly known as:  CYMBALTA Take 1 capsule (30 mg total) by mouth 2 (two) times daily. Take one capsule daily for week and than twice daily What changed:  additional instructions   Fish Oil 1000 MG Caps Take 1,000 mg by mouth at bedtime.   gabapentin 800 MG tablet Commonly known as:  NEURONTIN Take 800 mg by mouth 3 (three) times daily.   hydrochlorothiazide 25 MG tablet Commonly known as:  HYDRODIURIL Take 1 tablet (25 mg total) by mouth daily. What changed:  when to take this   Melatonin 5 MG Tabs Take 20  mg by mouth at bedtime.   MYRBETRIQ 50 MG Tb24 tablet Generic drug:  mirabegron ER Take 50 mg by mouth at bedtime.   oxyCODONE-acetaminophen 5-325 MG tablet Commonly known as:  PERCOCET/ROXICET Take 1 tablet by mouth every 4 (four) hours as needed.   pantoprazole 40 MG tablet Commonly known as:  PROTONIX TAKE ONE TABLET BY MOUTH EVERY DAY   prasugrel 5 MG Tabs tablet Commonly known as:  EFFIENT TAKE ONE TABLET BY MOUTH EVERY DAY What changed:  when to take this   traZODone 150 MG tablet Commonly known as:  DESYREL Take 1 tablet (150 mg total) by mouth at bedtime.       Diagnostic Studies: Xr Hip Unilat W Or W/o Pelvis 2-3 Views Right  Result Date: 04/15/2018 2 view  radiographs of the right hip shows progressive arthritis patient has bone-on-bone contact with subchondral sclerosis there is a large bone spur inferiorly on the acetabulum and femoral head.  Patient has an elongated femoral head from the osteoarthritis.   Patient benefited maximally from their hospital stay and there were no complications.     Disposition: Discharge disposition: 03-Skilled Nursing Facility      Discharge Instructions    Call MD / Call 911   Complete by:  As directed    If you experience chest pain or shortness of breath, CALL 911 and be transported to the hospital emergency room.  If you develope a fever above 101 F, pus (white drainage) or increased drainage or redness at the wound, or calf pain, call your surgeon's office.   Call MD / Call 911   Complete by:  As directed    If you experience chest pain or shortness of breath, CALL 911 and be transported to the hospital emergency room.  If you develope a fever above 101 F, pus (white drainage) or increased drainage or redness at the wound, or calf pain, call your surgeon's office.   Constipation Prevention   Complete by:  As directed    Drink plenty of fluids.  Prune juice may be helpful.  You may use a stool softener, such as Colace (over the counter) 100 mg twice a day.  Use MiraLax (over the counter) for constipation as needed.   Constipation Prevention   Complete by:  As directed    Drink plenty of fluids.  Prune juice may be helpful.  You may use a stool softener, such as Colace (over the counter) 100 mg twice a day.  Use MiraLax (over the counter) for constipation as needed.   Diet - low sodium heart healthy   Complete by:  As directed    Diet - low sodium heart healthy   Complete by:  As directed    Increase activity slowly as tolerated   Complete by:  As directed    Increase activity slowly as tolerated   Complete by:  As directed      Follow-up Information    Newt Minion, MD In 1 week.   Specialty:   Orthopedic Surgery Contact information: [address] [place] [address] [phone]            Signed: Newt Minion 04/24/2018, 7:02 AM

## 2018-04-24 NOTE — Plan of Care (Signed)

## 2018-04-24 NOTE — Progress Notes (Signed)
Patient ID: Travis [name]., male   DOB: 05/14/1951, 67 y.o.   MRN: [id number] Patient seen in follow-up.  Patient was able to stand and pivot with his prosthesis on this morning.  Plan for discharge to skilled nursing.  There is been no change in the patient's status since the initial discharge summary.

## 2018-04-24 NOTE — Progress Notes (Signed)
Patient will DC to: [facility] Anticipated DC date: 04/24/18 Family notified: [name] Transport by: Corey Harold  Per MD patient ready for DC to [facility]. RN, patient, patient's family, and facility notified of DC. Discharge Summary sent to facility. RN given number for report [phone]. DC packet on chart. Ambulance transport requested for patient.  CSW signing off.  [place], [place]

## 2018-04-24 NOTE — Telephone Encounter (Signed)
Pt was on TCM report he was admitted 04/17/2018 with a chief complaint of osteoarthritis right hip, with a final diagnosis of Osteoarthritis Right Hip.  Patient underwent Procedure(s):RIGHT TOTAL HIP ARTHROPLASTY.  Pt D/C 04/23/18, and will follow-up with Dr. Sharol Given in 1 week,,/lmb

## 2018-04-29 ENCOUNTER — Ambulatory Visit: Payer: Medicare HMO | Admitting: Psychology

## 2018-04-29 DIAGNOSIS — F3189 Other bipolar disorder: Secondary | ICD-10-CM | POA: Diagnosis not present

## 2018-04-29 DIAGNOSIS — G894 Chronic pain syndrome: Secondary | ICD-10-CM | POA: Diagnosis not present

## 2018-04-29 DIAGNOSIS — Z96641 Presence of right artificial hip joint: Secondary | ICD-10-CM | POA: Diagnosis not present

## 2018-04-30 ENCOUNTER — Encounter (INDEPENDENT_AMBULATORY_CARE_PROVIDER_SITE_OTHER): Payer: Self-pay | Admitting: Physician Assistant

## 2018-04-30 ENCOUNTER — Ambulatory Visit (INDEPENDENT_AMBULATORY_CARE_PROVIDER_SITE_OTHER): Payer: Medicare HMO | Admitting: Physician Assistant

## 2018-04-30 ENCOUNTER — Ambulatory Visit (INDEPENDENT_AMBULATORY_CARE_PROVIDER_SITE_OTHER): Payer: Self-pay

## 2018-04-30 VITALS — Ht 72.0 in | Wt 321.9 lb

## 2018-04-30 DIAGNOSIS — M1611 Unilateral primary osteoarthritis, right hip: Secondary | ICD-10-CM

## 2018-04-30 DIAGNOSIS — Z89511 Acquired absence of right leg below knee: Secondary | ICD-10-CM

## 2018-04-30 DIAGNOSIS — Z96641 Presence of right artificial hip joint: Secondary | ICD-10-CM

## 2018-04-30 NOTE — Progress Notes (Addendum)
Office Visit Note   Patient: Travis HOWTON Sr.           Date of Birth: 09/18/1950           MRN: [id number] Visit Date: 04/30/2018              Requested by: [name], MD [place], [place] [address] PCP: [name], MD  Chief Complaint  Patient presents with  . Right Hip - Routine Post Op      HPI: The patient is a 67 yo gentleman who is seen for post operative follow up following a right total hip arthroplasty on 04/17/2018.  OPERATIVE IMPLANTS: [place] carotic components with a 56 mm acetabulum with a +410 degree polyethylene liner with a size 16 high offset collar with a 36 mm -2 femoral head. POD# 13-the patient has copious serosanguineous drainage from the incisional area today.  He is reporting significant pain control issues as he is not getting his usual premorbid Percocet 10/325 and request that this be increased at the skilled nursing facility.   Assessment & Plan: Visit Diagnoses:  1. Status post total hip replacement, right   2. Unilateral primary osteoarthritis, right hip   3. History of right below knee amputation ([place])     Plan: We will leave staples in place.  Start doxycycline 100 mg p.o. twice daily x14 days.  Okay from a orthopedic standpoint for his patient to be increased to his usual premorbid Percocet 10/325 p.o. every 4 hours as needed pain dose.  Recommend ABD pads to the right hip incision changed daily after cleansing the right hip with saline moistened gauze.  Recommend he can weight-bear as tolerated with his right below the knee prosthesis in place.  He will follow-up next Thursday, 05/09/2018.  Orders were sent to the nursing facility concerning all the above.  We do plan to see next week with x-rays for this visit. Discussed drainage with Dr. Sharol Given and he also recommends stopping the patient's ASA and called [facility] and Spoke with his nurse, Danae Chen and she will write order to stop the aspirin.  Follow-Up  Instructions: Return in about 9 days (around 05/09/2018).   Ortho Exam  Patient is alert, oriented, no adenopathy, well-dressed, normal affect, normal respiratory effort. Patient is a morbidly obese male who presents at the wheelchair level.  He is able to stand with assistance and has copious drainage from his right lateral hip incision.  The drainage is serosanguineous and without purulence.  There is no peri-incisional erythema or increased warmth.  He is wearing his right below the knee prosthesis and has no difficulty with this.  Imaging: No results found. No images are attached to the encounter.  Labs: Lab Results  Component Value Date   HGBA1C 5.5 01/10/2018   HGBA1C 5.4 07/13/2017   HGBA1C 5.7 03/01/2017   ESRSEDRATE 20 (H) 09/03/2013   ESRSEDRATE 11 03/25/2013   CRP 10.7 (H) 09/03/2013   REPTSTATUS 01/30/2014 FINAL 01/29/2014   GRAMSTAIN  09/02/2013    WBC PRESENT, PREDOMINANTLY MONONUCLEAR NO ORGANISMS SEEN CYTOSPIN Performed by [facility] Performed at [facility]  09/02/2013    NO ORGANISMS SEEN WBC PRESENT, PREDOMINANTLY MONONUCLEAR CYTOSPIN Gram Stain Report Called to,Read Back By and Verified With: A LEDWELL RN 1933 09/02/13 A NAVARRO   CULT NO GROWTH Performed at [facility] 01/29/2014   LABORGA STAPHYLOCOCCUS SPECIES (COAGULASE NEGATIVE) 04/08/2015  Lab Results  Component Value Date   ALBUMIN 3.9 04/11/2018   ALBUMIN 4.6 01/10/2018   ALBUMIN 4.7 07/13/2017    Body mass index is 43.65 kg/m.  Orders:  No orders of the defined types were placed in this encounter.  No orders of the defined types were placed in this encounter.    Procedures: No procedures performed  Clinical Data: No additional findings.  ROS:  All other systems negative, except as noted in the HPI. Review of Systems  Objective: Vital Signs: Ht 6' (1.829 m)   Wt (!) 321 lb 13.9 oz (146 kg)   BMI 43.65 kg/m   Specialty Comments:   No specialty comments available.  PMFS History: Patient Active Problem List   Diagnosis Date Noted  . Status post THR (total hip replacement) 04/17/2018  . Unilateral primary osteoarthritis, right hip   . Morbid obesity with BMI of 40.0-44.9, adult ([place]) 02/28/2018  . Myocardial infarction ([place]) 02/25/2018  . Coronary artery disease involving native coronary artery of native heart 02/25/2018  . PAD (peripheral artery disease) ([place]) 02/25/2018  . S/P BKA (below knee amputation) unilateral, right ([place]) 02/25/2018  . Sacroiliitis ([place]) 02/25/2018  . SOB (shortness of breath) 01/03/2018  . Acute on chronic heart failure ([place]) 01/03/2018  . Severe right groin pain 12/20/2017  . Morbid obesity ([place]) 10/09/2017  . Exposure to sexually transmitted disease (STD) 07/14/2017  . Low back pain 07/13/2017  . Leukoplakia, tongue 01/26/2017  . Acute sinus infection 10/20/2016  . Hyperglycemia 10/20/2016  . Skin lesion 10/20/2016  . S/P unilateral BKA (below knee amputation), right ([place]) 06/14/2016  . Charcot foot due to diabetes mellitus ([place])   . Charcot's arthropathy associated with type 2 diabetes mellitus ([place]) 04/11/2016  . Preventative health care 11/05/2015  . Major depression 09/13/2015  . S/P TKR (total knee replacement) bilaterally 09/13/2015  . GERD (gastroesophageal reflux disease) 09/08/2015  . S/P laparoscopic hernia repair 05/11/2015  . PPD positive 04/08/2015  . Benign neoplasm of descending colon   . Benign neoplasm of cecum   . AKI (acute kidney injury) ([place]) 10/18/2014  . Acute blood loss anemia   . Chronic anticoagulation   . Occult blood positive stool 10/17/2014  . General weakness 07/14/2014  . Urinary incontinence 07/14/2014  . Headache(784.0) 10/15/2013  . Spinal stenosis in cervical region 09/26/2013    Class: Chronic  . Spinal stenosis, lumbar region, with neurogenic claudication 09/26/2013    Class: Chronic  . Hand joint pain 06/10/2013  . Rotator cuff tear  arthropathy of both shoulders 06/10/2013  . Skin lesion of cheek 05/01/2013  . Pain of right thumb 04/03/2013  . Balance disorder 03/12/2013  . Gait disorder 03/12/2013  . Tremor 03/12/2013  . Left hip pain 03/12/2013  . Pre-ulcerative corn or callous 02/06/2013  . Anxiety 11/12/2011  . OSA (obstructive sleep apnea) 11/07/2011  . Bradycardia 10/20/2011  . Insomnia 10/04/2011  . Obesity 01/12/2011  . ERECTILE DYSFUNCTION, ORGANIC 05/30/2010  . [place] DISEASE, LUMBAR 04/19/2010  . SCIATICA, LEFT 04/19/2010  . Chronic pain syndrome 10/27/2009  . Hyperlipidemia 07/15/2009  . Essential hypertension 06/24/2009  . Coronary artery disease involving native coronary artery of native heart without angina pectoris 06/24/2009  . Allergic rhinitis 06/24/2009  . URETHRAL STRICTURE 06/24/2009  . DEGENERATIVE JOINT DISEASE 06/24/2009  . SHOULDER PAIN, BILATERAL 06/24/2009  . FATIGUE 06/24/2009  . NEPHROLITHIASIS, HX OF 06/24/2009   Past Medical History:  Diagnosis Date  . ALLERGIC RHINITIS 06/24/2009  . Anxiety 11/12/2011  Adequate for discharge   . Arthritis    "all my joints" (09/30/2013)  . Arthritis of foot, right, degenerative 04/15/2014  . Balance disorder 03/12/2013  . Benign neoplasm of cecum   . Benign neoplasm of descending colon   . CAD (coronary artery disease) 06/24/2009   5 stents placed in 2007    . Chronic anticoagulation   . Chronic pain syndrome 10/27/2009   of ankle, shoulders, low back.  sciatica.   . Closed fracture of right foot 10/17/2014  . CORONARY ARTERY DISEASE 06/24/2009   a. s/p multiple PCIs - In 2008 he had a Taxus DES to the mild LAD, Endeavor DES to mid LCX and distal LCX. In January 2009 he had DES to distal LCX, mid LCX and proximal LCX. In November 2009 had BMS x 2 to the mid RCA. Cath 10/2011 with patent stents, noncardiac CP. LHC 01/2013: patent stents (noncardiac CP).  . DEGENERATIVE JOINT DISEASE 06/24/2009   Qualifier: Diagnosis of  By: [name], [name]   .  Depression   . Depression with anxiety    Prior suicide attempt  . [place] DISEASE, LUMBAR 04/19/2010  . ERECTILE DYSFUNCTION, ORGANIC 05/30/2010  . Essential hypertension 06/24/2009   Qualifier: Diagnosis of  By: [name], [name]   . Fibromyalgia   . Fracture dislocation of ankle joint 09/02/2015  . Gait disorder 03/12/2013  . General weakness 07/14/2014  . GERD (gastroesophageal reflux disease) 09/08/2015  . Hand joint pain 06/10/2013  . Heart murmur   . Hepatitis C   . History of kidney stones   . Hyperlipidemia 07/15/2009   Qualifier: Diagnosis of  By: Aundra Dubin, MD, [place]    . HYPERLIPIDEMIA-MIXED 07/15/2009  . HYPERTENSION 06/24/2009  . Insomnia 10/04/2011  . Irregular heart beat   . Left hip pain 03/12/2013   Injected under ultrasound guidance on June 24, 2013   . Major depression 09/13/2015  . Myocardial infarction ([place]) 2008  . Non-cardiac chest pain 10/2011, 01/2013  . Obesity   . Occult blood positive stool 10/17/2014  . Open ankle fracture 09/02/2015  . OSA (obstructive sleep apnea)    not using CPAP (09/30/2013)  . Pain of right thumb 04/03/2013  . Pneumonia   . PPD positive 04/08/2015  . Pre-ulcerative corn or callous 02/06/2013  . Rotator cuff tear arthropathy of both shoulders 06/10/2013   History of bilateral shoulder cuff surgery for rotator cuff tears. Reports increase in pain 09/11/2015 during physical therapy of the left shoulder.   [name] SCIATICA, LEFT 04/19/2010   Qualifier: Diagnosis of  By: [name], [name]   . Spinal stenosis in cervical region 09/26/2013  . Spinal stenosis, lumbar region, with neurogenic claudication 09/26/2013  . Type II diabetes mellitus ([place]) 2012   no meds in 09/2014.   [name] Uncontrolled type 2 DM with peripheral circulatory disorder ([place]) 10/04/2013  . URETHRAL STRICTURE 06/24/2009   self catheterizes.     Family History  Problem Relation Age of Onset  . Depression Mother   . Heart disease Mother   . Hypertension Mother   . Breast cancer Mother   .  Diabetes Father   . Heart disease Father        CABG  . Hypertension Father   . Hyperlipidemia Father   . Prostate cancer Father   . Skin cancer Father   . Depression Brother        x 2  . Hypertension Brother        x2  .  Healthy Son   . Heart disease Maternal Grandfather   . Early death Maternal Grandfather   . Heart attack Maternal Grandfather 22-Jul-2063  . Early death Paternal Grandfather   . Coronary artery disease Other   . Hypertension Other   . Depression Other   . Healthy Son     Past Surgical History:  Procedure Laterality Date  . AMPUTATION Right 06/14/2016   Procedure: AMPUTATION BELOW KNEE;  Surgeon: Newt Minion, MD;  Location: [facility];  Service: Orthopedics;  Laterality: Right;  . ANKLE FUSION Right 04/15/2014   Procedure: Right Subtalar, Talonavicular Fusion;  Surgeon: Newt Minion, MD;  Location: [facility];  Service: Orthopedics;  Laterality: Right;  . ANKLE FUSION Right 04/18/2016   Procedure: Right Ankle Tibiocalcaneal Fusion;  Surgeon: Newt Minion, MD;  Location: [facility];  Service: Orthopedics;  Laterality: Right;  . ANTERIOR CERVICAL DECOMP/DISCECTOMY FUSION N/A 09/26/2013   Procedure: ANTERIOR CERVICAL DISCECTOMY FUSION C3-4, plate and screw fixation, allograft bone graft;  Surgeon: Jessy Oto, MD;  Location: [facility];  Service: Orthopedics;  Laterality: N/A;  . BACK SURGERY    . BELOW KNEE LEG AMPUTATION Right 06/14/2016  . CARDIAC CATHETERIZATION  X 1  . CARPAL TUNNEL RELEASE Bilateral   . COLONOSCOPY    . COLONOSCOPY N/A 10/22/2014   Procedure: COLONOSCOPY;  Surgeon: Lafayette Dragon, MD;  Location: [facility] ENDOSCOPY;  Service: Endoscopy;  Laterality: N/A;  . CORONARY ANGIOPLASTY WITH STENT PLACEMENT     "I have 9 stents"  . ESOPHAGOGASTRODUODENOSCOPY N/A 10/19/2014   Procedure: ESOPHAGOGASTRODUODENOSCOPY (EGD);  Surgeon: Jerene Bears, MD;  Location: [facility] ENDOSCOPY;  Service: Endoscopy;  Laterality: N/A;  . FRACTURE SURGERY    . FUSION OF TALONAVICULAR JOINT Right 04/15/2014     dr duda  . HERNIA REPAIR     umbilical  . INGUINAL HERNIA REPAIR Right 05/11/2015   Procedure: LAPAROSCOPIC REPAIR RIGHT  INGUINAL HERNIA;  Surgeon: Greer Pickerel, MD;  Location: [facility];  Service: General;  Laterality: Right;  . INSERTION OF MESH Right 05/11/2015   Procedure: INSERTION OF MESH;  Surgeon: Greer Pickerel, MD;  Location: [facility];  Service: General;  Laterality: Right;  . JOINT REPLACEMENT    . KNEE CARTILAGE SURGERY Right X 12   "~ 1/2 open; ~ 1/2 scopes"  . KNEE CARTILAGE SURGERY Left X 3   "3 scopes"  . LEFT HEART CATHETERIZATION WITH CORONARY ANGIOGRAM N/A 02/10/2013   Procedure: LEFT HEART CATHETERIZATION WITH CORONARY ANGIOGRAM;  Surgeon: Burnell Blanks, MD;  Location: [facility] CATH LAB;  Service: Cardiovascular;  Laterality: N/A;  . LUMBAR LAMINECTOMY/DECOMPRESSION MICRODISCECTOMY N/A 01/27/2014   Procedure: CENTRAL LUMBAR LAMINECTOMY L4-5 AND L3-4;  Surgeon: Jessy Oto, MD;  Location: [facility];  Service: Orthopedics;  Laterality: N/A;  . ORIF ANKLE FRACTURE Right 09/02/2015   Procedure: OPEN REDUCTION INTERNAL FIXATION (ORIF) ANKLE FRACTURE;  Surgeon: Newt Minion, MD;  Location: [facility];  Service: Orthopedics;  Laterality: Right;  . PERIPHERALLY INSERTED CENTRAL CATHETER INSERTION  09/02/2015  . SHOULDER ARTHROSCOPY W/ ROTATOR CUFF REPAIR Bilateral    "3 on the right; 1 on the left"  . SKIN SPLIT GRAFT Right 10/01/2015   Procedure: RIGHT ANKLE APPLY SKIN GRAFT SPLIT THICKNESS;  Surgeon: Newt Minion, MD;  Location: [facility];  Service: Orthopedics;  Laterality: Right;  . TONSILLECTOMY    . TOOTH EXTRACTION    . TOTAL HIP ARTHROPLASTY Right 04/17/2018  . TOTAL HIP ARTHROPLASTY Right 04/17/2018   Procedure: RIGHT  TOTAL HIP ARTHROPLASTY;  Surgeon: Newt Minion, MD;  Location: [facility];  Service: Orthopedics;  Laterality: Right;  . TOTAL KNEE ARTHROPLASTY Bilateral 2008  . UMBILICAL HERNIA REPAIR     UHR  . URETHRAL DILATION  X 4  . VASECTOMY    . WISDOM TOOTH EXTRACTION      Social History   Occupational History  . Occupation: disabled since 2006 due to ortho. heart, psych    Employer: UNEMPLOYED  . Occupation: part time work as an Multimedia programmer, wrestling, and Holiday representative  Tobacco Use  . Smoking status: Former Smoker    Types: Cigars    Last attempt to quit: 08/28/2010    Years since quitting: 7.6  . Smokeless tobacco: Never Used  . Tobacco comment: 04/18/2016 "smoked 1 cigar/wk when I did smoke"  Substance and Sexual Activity  . Alcohol use: No    Alcohol/week: 0.0 standard drinks    Comment: rarely  . Drug use: No  . Sexual activity: Not Currently

## 2018-05-01 DIAGNOSIS — Z7902 Long term (current) use of antithrombotics/antiplatelets: Secondary | ICD-10-CM | POA: Diagnosis not present

## 2018-05-01 DIAGNOSIS — Z96641 Presence of right artificial hip joint: Secondary | ICD-10-CM | POA: Diagnosis not present

## 2018-05-01 DIAGNOSIS — M545 Low back pain: Secondary | ICD-10-CM | POA: Diagnosis not present

## 2018-05-06 DIAGNOSIS — Z96641 Presence of right artificial hip joint: Secondary | ICD-10-CM | POA: Diagnosis not present

## 2018-05-06 DIAGNOSIS — G894 Chronic pain syndrome: Secondary | ICD-10-CM | POA: Diagnosis not present

## 2018-05-09 ENCOUNTER — Ambulatory Visit (INDEPENDENT_AMBULATORY_CARE_PROVIDER_SITE_OTHER): Payer: Medicare HMO

## 2018-05-09 ENCOUNTER — Encounter (INDEPENDENT_AMBULATORY_CARE_PROVIDER_SITE_OTHER): Payer: Self-pay | Admitting: Physician Assistant

## 2018-05-09 ENCOUNTER — Ambulatory Visit (INDEPENDENT_AMBULATORY_CARE_PROVIDER_SITE_OTHER): Payer: Medicare HMO | Admitting: Physician Assistant

## 2018-05-09 VITALS — Ht 72.0 in | Wt 321.0 lb

## 2018-05-09 DIAGNOSIS — Z6841 Body Mass Index (BMI) 40.0 and over, adult: Secondary | ICD-10-CM

## 2018-05-09 DIAGNOSIS — Z96641 Presence of right artificial hip joint: Secondary | ICD-10-CM | POA: Diagnosis not present

## 2018-05-09 DIAGNOSIS — Z89511 Acquired absence of right leg below knee: Secondary | ICD-10-CM

## 2018-05-10 ENCOUNTER — Encounter (INDEPENDENT_AMBULATORY_CARE_PROVIDER_SITE_OTHER): Payer: Self-pay | Admitting: Physician Assistant

## 2018-05-10 DIAGNOSIS — Z96641 Presence of right artificial hip joint: Secondary | ICD-10-CM | POA: Diagnosis not present

## 2018-05-10 DIAGNOSIS — G894 Chronic pain syndrome: Secondary | ICD-10-CM | POA: Diagnosis not present

## 2018-05-10 NOTE — Progress Notes (Signed)
Office Visit Note   Patient: Travis RADLE Sr.           Date of Birth: 1950-08-22           MRN: [id number] Visit Date: 05/09/2018              Requested by: [name], MD [address] [place], [place] [address] PCP: [name], MD  Chief Complaint  Patient presents with  . Right Hip - Routine Post Op    04/17/18 left total hip arthroplasty       HPI: The patient is a 67 year old gentleman who is seen for postoperative follow-up following a right total hip arthroplasty on 04/17/2018.  He is continued to have some drainage from the right hip incision and was covered with doxycycline.  He is at [facility] facility for rehabilitation and having physical therapy.  He reports his pain overall is somewhat better controlled.  He does have a history of a right below the knee amputation and is followed at [facility].  Assessment & Plan: Visit Diagnoses:  1. History of total hip arthroplasty, right   2. History of right below knee amputation ([place])   3. Body mass index 40.0-44.9, adult (HCC)     Plan: Continue doxycycline 100 mg p.o. twice daily for 2 more weeks.  The patient was given a prescription for [facility] for his right below the knee amputation for a new sleeve materials and supplies.  He should continue to have daily dressing changes to the right hip incision.  He will follow-up in 2 weeks or sooner should he have difficulties in the interim.  Follow-Up Instructions: Return in about 2 weeks (around 05/23/2018).   Ortho Exam  Patient is alert, oriented, no adenopathy, well-dressed, normal affect, normal respiratory effort. Right hip incision staples were harvested this visit.  The patient continues to have moderate serosanguineous drainage from the incision without overt opening.  There is no periwound erythema or induration of the area.  He is nontender to palpation.  Imaging: No results found. No images are attached to the  encounter.  Labs: Lab Results  Component Value Date   HGBA1C 5.5 01/10/2018   HGBA1C 5.4 07/13/2017   HGBA1C 5.7 03/01/2017   ESRSEDRATE 20 (H) 09/03/2013   ESRSEDRATE 11 03/25/2013   CRP 10.7 (H) 09/03/2013   REPTSTATUS 01/30/2014 FINAL 01/29/2014   GRAMSTAIN  09/02/2013    WBC PRESENT, PREDOMINANTLY MONONUCLEAR NO ORGANISMS SEEN CYTOSPIN Performed by [facility] Performed at [facility]  09/02/2013    NO ORGANISMS SEEN WBC PRESENT, PREDOMINANTLY MONONUCLEAR CYTOSPIN Gram Stain Report Called to,Read Back By and Verified With: A LEDWELL RN 1933 09/02/13 A NAVARRO   CULT NO GROWTH Performed at [facility] 01/29/2014   LABORGA STAPHYLOCOCCUS SPECIES (COAGULASE NEGATIVE) 04/08/2015     Lab Results  Component Value Date   ALBUMIN 3.9 04/11/2018   ALBUMIN 4.6 01/10/2018   ALBUMIN 4.7 07/13/2017    Body mass index is 43.54 kg/m.  Orders:  Orders Placed This Encounter  Procedures  . XR HIP UNILAT W OR W/O PELVIS 2-3 VIEWS RIGHT   No orders of the defined types were placed in this encounter.    Procedures: No procedures performed  Clinical Data: No additional findings.  ROS:  All other systems negative, except as noted in the HPI. Review of Systems  Objective: Vital Signs: Ht 6' (1.829 m)   Travis [name])  321 lb (145.6 kg)   BMI 43.54 kg/m   Specialty Comments:  No specialty comments available.  PMFS History: Patient Active Problem List   Diagnosis Date Noted  . Status post THR (total hip replacement) 04/17/2018  . Unilateral primary osteoarthritis, right hip   . Morbid obesity with BMI of 40.0-44.9, adult ([place]) 02/28/2018  . Myocardial infarction ([place]) 02/25/2018  . Coronary artery disease involving native coronary artery of native heart 02/25/2018  . PAD (peripheral artery disease) ([place]) 02/25/2018  . S/P BKA (below knee amputation) unilateral, right ([place]) 02/25/2018  . Sacroiliitis ([place]) 02/25/2018  . SOB  (shortness of breath) 01/03/2018  . Acute on chronic heart failure ([place]) 01/03/2018  . Severe right groin pain 12/20/2017  . Morbid obesity ([place]) 10/09/2017  . Exposure to sexually transmitted disease (STD) 07/14/2017  . Low back pain 07/13/2017  . Leukoplakia, tongue 01/26/2017  . Acute sinus infection 10/20/2016  . Hyperglycemia 10/20/2016  . Skin lesion 10/20/2016  . S/P unilateral BKA (below knee amputation), right ([place]) 06/14/2016  . Charcot foot due to diabetes mellitus ([place])   . Charcot's arthropathy associated with type 2 diabetes mellitus ([place]) 04/11/2016  . Preventative health care 11/05/2015  . Major depression 09/13/2015  . S/P TKR (total knee replacement) bilaterally 09/13/2015  . GERD (gastroesophageal reflux disease) 09/08/2015  . S/P laparoscopic hernia repair 05/11/2015  . PPD positive 04/08/2015  . Benign neoplasm of descending colon   . Benign neoplasm of cecum   . AKI (acute kidney injury) ([place]) 10/18/2014  . Acute blood loss anemia   . Chronic anticoagulation   . Occult blood positive stool 10/17/2014  . General weakness 07/14/2014  . Urinary incontinence 07/14/2014  . Headache(784.0) 10/15/2013  . Spinal stenosis in cervical region 09/26/2013    Class: Chronic  . Spinal stenosis, lumbar region, with neurogenic claudication 09/26/2013    Class: Chronic  . Hand joint pain 06/10/2013  . Rotator cuff tear arthropathy of both shoulders 06/10/2013  . Skin lesion of cheek 05/01/2013  . Pain of right thumb 04/03/2013  . Balance disorder 03/12/2013  . Gait disorder 03/12/2013  . Tremor 03/12/2013  . Left hip pain 03/12/2013  . Pre-ulcerative corn or callous 02/06/2013  . Anxiety 11/12/2011  . OSA (obstructive sleep apnea) 11/07/2011  . Bradycardia 10/20/2011  . Insomnia 10/04/2011  . Obesity 01/12/2011  . ERECTILE DYSFUNCTION, ORGANIC 05/30/2010  . [place] DISEASE, LUMBAR 04/19/2010  . SCIATICA, LEFT 04/19/2010  . Chronic pain syndrome 10/27/2009  .  Hyperlipidemia 07/15/2009  . Essential hypertension 06/24/2009  . Coronary artery disease involving native coronary artery of native heart without angina pectoris 06/24/2009  . Allergic rhinitis 06/24/2009  . URETHRAL STRICTURE 06/24/2009  . DEGENERATIVE JOINT DISEASE 06/24/2009  . SHOULDER PAIN, BILATERAL 06/24/2009  . FATIGUE 06/24/2009  . NEPHROLITHIASIS, HX OF 06/24/2009   Past Medical History:  Diagnosis Date  . ALLERGIC RHINITIS 06/24/2009  . Anxiety 11/12/2011   Adequate for discharge   . Arthritis    "all my joints" (09/30/2013)  . Arthritis of foot, right, degenerative 04/15/2014  . Balance disorder 03/12/2013  . Benign neoplasm of cecum   . Benign neoplasm of descending colon   . CAD (coronary artery disease) 06/24/2009   5 stents placed in 2007    . Chronic anticoagulation   . Chronic pain syndrome 10/27/2009   of ankle, shoulders, low back.  sciatica.   . Closed fracture of right foot 10/17/2014  . CORONARY ARTERY DISEASE 06/24/2009   a. s/p multiple  PCIs - In 2006/07/26 he had a Taxus DES to the mild LAD, Endeavor DES to mid LCX and distal LCX. In January 2009 he had DES to distal LCX, mid LCX and proximal LCX. In November 2009 had BMS x 2 to the mid RCA. Cath 10/2011 with patent stents, noncardiac CP. LHC 01/2013: patent stents (noncardiac CP).  . DEGENERATIVE JOINT DISEASE 06/24/2009   Qualifier: Diagnosis of  By: [name], [name]   . Depression   . Depression with anxiety    Prior suicide attempt  . [place] DISEASE, LUMBAR 04/19/2010  . ERECTILE DYSFUNCTION, ORGANIC 05/30/2010  . Essential hypertension 06/24/2009   Qualifier: Diagnosis of  By: [name], [name]   . Fibromyalgia   . Fracture dislocation of ankle joint 09/02/2015  . Gait disorder 03/12/2013  . General weakness 07/14/2014  . GERD (gastroesophageal reflux disease) 09/08/2015  . Hand joint pain 06/10/2013  . Heart murmur   . Hepatitis C   . History of kidney stones   . Hyperlipidemia 07/15/2009   Qualifier: Diagnosis of  By:  Aundra Dubin, MD, [place]    . HYPERLIPIDEMIA-MIXED 07/15/2009  . HYPERTENSION 06/24/2009  . Insomnia 10/04/2011  . Irregular heart beat   . Left hip pain 03/12/2013   Injected under ultrasound guidance on June 24, 2013   . Major depression 09/13/2015  . Myocardial infarction ([place]) 26-Jul-2006  . Non-cardiac chest pain 10/2011, 01/2013  . Obesity   . Occult blood positive stool 10/17/2014  . Open ankle fracture 09/02/2015  . OSA (obstructive sleep apnea)    not using CPAP (09/30/2013)  . Pain of right thumb 04/03/2013  . Pneumonia   . PPD positive 04/08/2015  . Pre-ulcerative corn or callous 02/06/2013  . Rotator cuff tear arthropathy of both shoulders 06/10/2013   History of bilateral shoulder cuff surgery for rotator cuff tears. Reports increase in pain 09/11/2015 during physical therapy of the left shoulder.   Marland [name] SCIATICA, LEFT 04/19/2010   Qualifier: Diagnosis of  By: [name], [name]   . Spinal stenosis in cervical region 09/26/2013  . Spinal stenosis, lumbar region, with neurogenic claudication 09/26/2013  . Type II diabetes mellitus ([place]) 07-26-2010   no meds in 09/2014.   Marland [name] Uncontrolled type 2 DM with peripheral circulatory disorder ([place]) 10/04/2013  . URETHRAL STRICTURE 06/24/2009   self catheterizes.     Family History  Problem Relation Age of Onset  . Depression Mother   . Heart disease Mother   . Hypertension Mother   . Breast cancer Mother   . Diabetes Father   . Heart disease Father        CABG  . Hypertension Father   . Hyperlipidemia Father   . Prostate cancer Father   . Skin cancer Father   . Depression Brother        x 2  . Hypertension Brother        x2  . Healthy Son   . Heart disease Maternal Grandfather   . Early death Maternal Grandfather   . Heart attack Maternal Grandfather July 27, 2063  . Early death Paternal Grandfather   . Coronary artery disease Other   . Hypertension Other   . Depression Other   . Healthy Son     Past Surgical History:  Procedure Laterality Date  .  AMPUTATION Right 06/14/2016   Procedure: AMPUTATION BELOW KNEE;  Surgeon: Newt Minion, MD;  Location: [facility];  Service: Orthopedics;  Laterality: Right;  . ANKLE FUSION Right 04/15/2014  Procedure: Right Subtalar, Talonavicular Fusion;  Surgeon: Newt Minion, MD;  Location: [facility];  Service: Orthopedics;  Laterality: Right;  . ANKLE FUSION Right 04/18/2016   Procedure: Right Ankle Tibiocalcaneal Fusion;  Surgeon: Newt Minion, MD;  Location: [facility];  Service: Orthopedics;  Laterality: Right;  . ANTERIOR CERVICAL DECOMP/DISCECTOMY FUSION N/A 09/26/2013   Procedure: ANTERIOR CERVICAL DISCECTOMY FUSION C3-4, plate and screw fixation, allograft bone graft;  Surgeon: Jessy Oto, MD;  Location: [facility];  Service: Orthopedics;  Laterality: N/A;  . BACK SURGERY    . BELOW KNEE LEG AMPUTATION Right 06/14/2016  . CARDIAC CATHETERIZATION  X 1  . CARPAL TUNNEL RELEASE Bilateral   . COLONOSCOPY    . COLONOSCOPY N/A 10/22/2014   Procedure: COLONOSCOPY;  Surgeon: Lafayette Dragon, MD;  [facility] ENDOSCOPY;  Service: Endoscopy;  Laterality: N/A;  . CORONARY ANGIOPLASTY WITH STENT PLACEMENT     "I have 9 stents"  . ESOPHAGOGASTRODUODENOSCOPY N/A 10/19/2014   Procedure: ESOPHAGOGASTRODUODENOSCOPY (EGD);  Surgeon: Jerene Bears, MD;  Location: [facility] ENDOSCOPY;  Service: Endoscopy;  Laterality: N/A;  . FRACTURE SURGERY    . FUSION OF TALONAVICULAR JOINT Right 04/15/2014   dr duda  . HERNIA REPAIR     umbilical  . INGUINAL HERNIA REPAIR Right 05/11/2015   Procedure: LAPAROSCOPIC REPAIR RIGHT  INGUINAL HERNIA;  Surgeon: Greer Pickerel, MD;  Location: [facility];  Service: General;  Laterality: Right;  . INSERTION OF MESH Right 05/11/2015   Procedure: INSERTION OF MESH;  Surgeon: Greer Pickerel, MD;  Location: [facility];  Service: General;  Laterality: Right;  . JOINT REPLACEMENT    . KNEE CARTILAGE SURGERY Right X 12   "~ 1/2 open; ~ 1/2 scopes"  . KNEE CARTILAGE SURGERY Left X 3   "3 scopes"  . LEFT HEART CATHETERIZATION WITH  CORONARY ANGIOGRAM N/A 02/10/2013   Procedure: LEFT HEART CATHETERIZATION WITH CORONARY ANGIOGRAM;  Surgeon: Burnell Blanks, MD;  Location: [facility] CATH LAB;  Service: Cardiovascular;  Laterality: N/A;  . LUMBAR LAMINECTOMY/DECOMPRESSION MICRODISCECTOMY N/A 01/27/2014   Procedure: CENTRAL LUMBAR LAMINECTOMY L4-5 AND L3-4;  Surgeon: Jessy Oto, MD;  Location: [facility];  Service: Orthopedics;  Laterality: N/A;  . ORIF ANKLE FRACTURE Right 09/02/2015   Procedure: OPEN REDUCTION INTERNAL FIXATION (ORIF) ANKLE FRACTURE;  Surgeon: Newt Minion, MD;  Location: [facility];  Service: Orthopedics;  Laterality: Right;  . PERIPHERALLY INSERTED CENTRAL CATHETER INSERTION  09/02/2015  . SHOULDER ARTHROSCOPY W/ ROTATOR CUFF REPAIR Bilateral    "3 on the right; 1 on the left"  . SKIN SPLIT GRAFT Right 10/01/2015   Procedure: RIGHT ANKLE APPLY SKIN GRAFT SPLIT THICKNESS;  Surgeon: Newt Minion, MD;  Location: [facility];  Service: Orthopedics;  Laterality: Right;  . TONSILLECTOMY    . TOOTH EXTRACTION    . TOTAL HIP ARTHROPLASTY Right 04/17/2018  . TOTAL HIP ARTHROPLASTY Right 04/17/2018   Procedure: RIGHT TOTAL HIP ARTHROPLASTY;  Surgeon: Newt Minion, MD;  Location: [facility];  Service: Orthopedics;  Laterality: Right;  . TOTAL KNEE ARTHROPLASTY Bilateral 2008  . UMBILICAL HERNIA REPAIR     UHR  . URETHRAL DILATION  X 4  . VASECTOMY    . WISDOM TOOTH EXTRACTION     Social History   Occupational History  . Occupation: disabled since 2006 due to ortho. heart, psych    Employer: UNEMPLOYED  . Occupation: part time work as an Multimedia programmer, wrestling, and Holiday representative  Tobacco Use  .  Smoking status: Former Smoker    Types: Cigars    Last attempt to quit: 08/28/2010    Years since quitting: 7.7  . Smokeless tobacco: Never Used  . Tobacco comment: 04/18/2016 "smoked 1 cigar/wk when I did smoke"  Substance and Sexual Activity  . Alcohol use: No    Alcohol/week: 0.0 standard drinks     Comment: rarely  . Drug use: No  . Sexual activity: Not Currently

## 2018-05-13 ENCOUNTER — Other Ambulatory Visit: Payer: Self-pay | Admitting: Internal Medicine

## 2018-05-13 ENCOUNTER — Encounter: Payer: Medicare HMO | Attending: General Surgery | Admitting: Skilled Nursing Facility1

## 2018-05-13 ENCOUNTER — Ambulatory Visit (HOSPITAL_COMMUNITY): Payer: Medicare HMO | Admitting: Psychiatry

## 2018-05-13 ENCOUNTER — Encounter: Payer: Self-pay | Admitting: Skilled Nursing Facility1

## 2018-05-13 DIAGNOSIS — Z6841 Body Mass Index (BMI) 40.0 and over, adult: Secondary | ICD-10-CM | POA: Insufficient documentation

## 2018-05-13 DIAGNOSIS — I2510 Atherosclerotic heart disease of native coronary artery without angina pectoris: Secondary | ICD-10-CM | POA: Insufficient documentation

## 2018-05-13 DIAGNOSIS — Z7902 Long term (current) use of antithrombotics/antiplatelets: Secondary | ICD-10-CM | POA: Insufficient documentation

## 2018-05-13 DIAGNOSIS — F319 Bipolar disorder, unspecified: Secondary | ICD-10-CM | POA: Insufficient documentation

## 2018-05-13 DIAGNOSIS — G894 Chronic pain syndrome: Secondary | ICD-10-CM | POA: Insufficient documentation

## 2018-05-13 DIAGNOSIS — E119 Type 2 diabetes mellitus without complications: Secondary | ICD-10-CM | POA: Insufficient documentation

## 2018-05-13 DIAGNOSIS — I10 Essential (primary) hypertension: Secondary | ICD-10-CM | POA: Insufficient documentation

## 2018-05-13 DIAGNOSIS — E669 Obesity, unspecified: Secondary | ICD-10-CM

## 2018-05-13 DIAGNOSIS — Z89511 Acquired absence of right leg below knee: Secondary | ICD-10-CM | POA: Insufficient documentation

## 2018-05-13 DIAGNOSIS — Z713 Dietary counseling and surveillance: Secondary | ICD-10-CM | POA: Insufficient documentation

## 2018-05-13 DIAGNOSIS — G4733 Obstructive sleep apnea (adult) (pediatric): Secondary | ICD-10-CM | POA: Diagnosis not present

## 2018-05-13 DIAGNOSIS — Z79899 Other long term (current) drug therapy: Secondary | ICD-10-CM | POA: Insufficient documentation

## 2018-05-13 DIAGNOSIS — Z955 Presence of coronary angioplasty implant and graft: Secondary | ICD-10-CM | POA: Diagnosis not present

## 2018-05-13 DIAGNOSIS — M160 Bilateral primary osteoarthritis of hip: Secondary | ICD-10-CM | POA: Insufficient documentation

## 2018-05-13 NOTE — Progress Notes (Signed)
Sleeve Assessment: 4th  SWL Appointment.   According to pts referral the pt needs 6 SWL with 2 assessments. Pt state he works with a professional for depression talk therapy and medication management.   Pt does have right below the knee amputation.  Pt states he has a goal of 2000 calories. Pt states working with his phycologist has been helpful. Pt states he has been working on hunger verses appetite.Pt does get meals on wheels.   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 Pt states to control his emotional eating he walk to the steps or bedside exercises.  Pt states he just had his hip replaced so he cannot get out of the wheel chair and get his weight taken (this facility does not have a wheel chair scale). Pt reported his weight as being 320 pounds. Pt states one of his staple lines is "seeping" with fluid and blood. Pt states he will be in his rehab facility until January 2nd. Pt states he has a regimented schedule at the rehab facility. Pt states he is still working on chewing until applesauce consistency and not drinking with meals . Pts facility has him on a carb modified diet per document from facility.   Start weight at [facility]: 317.8 (this weight is with his prosthesis) Weight: 320 (pt reported) BMI: 43.40  MEDICATIONS: see list   DIETARY INTAKE:  24 hr Dietary Recall: 3 meals a day eating every 3-5 hours First Meal: 2 bacon and 1 eggs and 2 pieces of toast  Snack: milk and kellog bar Second Meal: meals on wheels   Third Meal: 5 dollar pizza from [employer] or hamburger or hotdog or chicken with man n cheese and asparagus  Snack 9pm: popcorn Beverages: 4 liters a day of water, 1% milk  Usual physical activity: ADL's due to pain  Diet to Follow: 2000 calories 225 g carbohydrates 150 g protein 56 g fat   Nutritional Diagnosis:  [place]-3.3 Overweight/obesity related to past poor dietary habits and physical inactivity as evidenced by patient w/ planned Sleeve surgery following dietary  guidelines for continued weight loss.    Intervention:  Nutrition counseling for upcoming Bariatric Surgery. Goals: -Encouraged to engage in 150 minutes of moderate physical activity including cardiovascular and weight baring weekly -Continue to make those great changes! -Work on keeping fat in the single digits -Focus on healing after your hip surgery with at least 4-5 meals/snacks a day and 80-100 fluid ounces   Teaching Method Utilized:  Visual Auditory Hands on  Handouts given during visit include:  Pre-op goals  Barriers to learning/adherence to lifestyle change: emotional eating  Demonstrated degree of understanding via:  Teach Back   Monitoring/Evaluation:  Dietary intake, exercise,and body weight prn.

## 2018-05-17 DIAGNOSIS — G894 Chronic pain syndrome: Secondary | ICD-10-CM | POA: Diagnosis not present

## 2018-05-17 DIAGNOSIS — Z96641 Presence of right artificial hip joint: Secondary | ICD-10-CM | POA: Diagnosis not present

## 2018-05-20 ENCOUNTER — Ambulatory Visit (HOSPITAL_COMMUNITY): Payer: Medicare HMO | Admitting: Psychiatry

## 2018-05-23 ENCOUNTER — Ambulatory Visit (INDEPENDENT_AMBULATORY_CARE_PROVIDER_SITE_OTHER): Payer: Medicare HMO | Admitting: Physician Assistant

## 2018-05-23 ENCOUNTER — Encounter (INDEPENDENT_AMBULATORY_CARE_PROVIDER_SITE_OTHER): Payer: Self-pay | Admitting: Orthopedic Surgery

## 2018-05-23 VITALS — Ht 72.0 in | Wt 320.0 lb

## 2018-05-23 DIAGNOSIS — Z96641 Presence of right artificial hip joint: Secondary | ICD-10-CM

## 2018-05-23 DIAGNOSIS — Z6841 Body Mass Index (BMI) 40.0 and over, adult: Secondary | ICD-10-CM

## 2018-05-23 DIAGNOSIS — Z89511 Acquired absence of right leg below knee: Secondary | ICD-10-CM

## 2018-05-23 NOTE — Progress Notes (Signed)
Office Visit Note   Patient: Travis WILTGEN Sr.           Date of Birth: 10-08-1950           MRN: [id number] Visit Date: 05/23/2018              Requested by: [name], MD [address] [place], [place] [address] PCP: [name], MD  Chief Complaint  Patient presents with  . Left Hip - Routine Post Op    04/17/18 left hip total hip       HPI: The patient is a 68 year old gentleman who is seen for postoperative follow-up following a right total hip replacement 04/17/2018.  He had some incisional drainage postoperatively and was treated with doxycycline and is completing a course of this.  He reports that the nursing home found to other staples as his incision and took these out.  He continues in rehabilitation in a skilled nursing facility.  He remains on total hip restrictions.  He is currently about 5 weeks postop.  He is walking weightbearing as tolerated with his right lower extremity transtibial prosthesis in place and has having no difficulty with the prosthetic.  He reports minimal pain over the hip with mobilization.  Assessment & Plan: Visit Diagnoses:  1. History of total hip arthroplasty, right   2. History of right below knee amputation ([place])   3. Body mass index 40.0-44.9, adult ([place])     Plan: Continue physical therapy for progression with gait and strengthening.  Continue total hip precautions with no flexion beyond 90 degrees at this time.  He will follow-up here in 4 weeks or sooner should he have difficulties in the interim.  Follow-Up Instructions: Return in about 4 weeks (around 06/20/2018).   Ortho Exam  Patient is alert, oriented, no adenopathy, well-dressed, normal affect, normal respiratory effort. The right hip incision is clean dry and intact today.  There are no signs of cellulitis or infection.  He has good internal and external rotation. Range of motion without pain  Imaging: No results found. No images are attached to the  encounter.  Labs: Lab Results  Component Value Date   HGBA1C 5.5 01/10/2018   HGBA1C 5.4 07/13/2017   HGBA1C 5.7 03/01/2017   ESRSEDRATE 20 (H) 09/03/2013   ESRSEDRATE 11 03/25/2013   CRP 10.7 (H) 09/03/2013   REPTSTATUS 01/30/2014 FINAL 01/29/2014   GRAMSTAIN  09/02/2013    WBC PRESENT, PREDOMINANTLY MONONUCLEAR NO ORGANISMS SEEN CYTOSPIN Performed by [facility] Performed at [place]  09/02/2013    NO ORGANISMS SEEN WBC PRESENT, PREDOMINANTLY MONONUCLEAR CYTOSPIN Gram Stain Report Called to,Read Back By and Verified With: A LEDWELL RN 1933 09/02/13 A NAVARRO   CULT NO GROWTH Performed at [facility] 01/29/2014   LABORGA STAPHYLOCOCCUS SPECIES (COAGULASE NEGATIVE) 04/08/2015     Lab Results  Component Value Date   ALBUMIN 3.9 04/11/2018   ALBUMIN 4.6 01/10/2018   ALBUMIN 4.7 07/13/2017    Body mass index is 43.4 kg/m.  Orders:  No orders of the defined types were placed in this encounter.  No orders of the defined types were placed in this encounter.    Procedures: No procedures performed  Clinical Data: No additional findings.  ROS:  All other systems negative, except as noted in the HPI. Review of Systems  Objective: Vital Signs: Ht 6' (1.829 m)   Wt (!) 320 lb (145.2 kg)   BMI  43.40 kg/m   Specialty Comments:  No specialty comments available.  PMFS History: Patient Active Problem List   Diagnosis Date Noted  . Status post THR (total hip replacement) 04/17/2018  . Unilateral primary osteoarthritis, right hip   . Morbid obesity with BMI of 40.0-44.9, adult ([place]) 02/28/2018  . Myocardial infarction ([place]) 02/25/2018  . Coronary artery disease involving native coronary artery of native heart 02/25/2018  . PAD (peripheral artery disease) ([place]) 02/25/2018  . S/P BKA (below knee amputation) unilateral, right ([place]) 02/25/2018  . Sacroiliitis ([place]) 02/25/2018  . SOB (shortness of breath) 01/03/2018  .  Acute on chronic heart failure ([place]) 01/03/2018  . Severe right groin pain 12/20/2017  . Morbid obesity ([place]) 10/09/2017  . Exposure to sexually transmitted disease (STD) 07/14/2017  . Low back pain 07/13/2017  . Leukoplakia, tongue 01/26/2017  . Acute sinus infection 10/20/2016  . Hyperglycemia 10/20/2016  . Skin lesion 10/20/2016  . S/P unilateral BKA (below knee amputation), right ([place]) 06/14/2016  . Charcot foot due to diabetes mellitus ([place])   . Charcot's arthropathy associated with type 2 diabetes mellitus ([place]) 04/11/2016  . Preventative health care 11/05/2015  . Major depression 09/13/2015  . S/P TKR (total knee replacement) bilaterally 09/13/2015  . GERD (gastroesophageal reflux disease) 09/08/2015  . S/P laparoscopic hernia repair 05/11/2015  . PPD positive 04/08/2015  . Benign neoplasm of descending colon   . Benign neoplasm of cecum   . AKI (acute kidney injury) ([place]) 10/18/2014  . Acute blood loss anemia   . Chronic anticoagulation   . Occult blood positive stool 10/17/2014  . General weakness 07/14/2014  . Urinary incontinence 07/14/2014  . Headache(784.0) 10/15/2013  . Spinal stenosis in cervical region 09/26/2013    Class: Chronic  . Spinal stenosis, lumbar region, with neurogenic claudication 09/26/2013    Class: Chronic  . Hand joint pain 06/10/2013  . Rotator cuff tear arthropathy of both shoulders 06/10/2013  . Skin lesion of cheek 05/01/2013  . Pain of right thumb 04/03/2013  . Balance disorder 03/12/2013  . Gait disorder 03/12/2013  . Tremor 03/12/2013  . Left hip pain 03/12/2013  . Pre-ulcerative corn or callous 02/06/2013  . Anxiety 11/12/2011  . OSA (obstructive sleep apnea) 11/07/2011  . Bradycardia 10/20/2011  . Insomnia 10/04/2011  . Obesity 01/12/2011  . ERECTILE DYSFUNCTION, ORGANIC 05/30/2010  . [place] DISEASE, LUMBAR 04/19/2010  . SCIATICA, LEFT 04/19/2010  . Chronic pain syndrome 10/27/2009  . Hyperlipidemia 07/15/2009  . Essential  hypertension 06/24/2009  . Coronary artery disease involving native coronary artery of native heart without angina pectoris 06/24/2009  . Allergic rhinitis 06/24/2009  . URETHRAL STRICTURE 06/24/2009  . DEGENERATIVE JOINT DISEASE 06/24/2009  . SHOULDER PAIN, BILATERAL 06/24/2009  . FATIGUE 06/24/2009  . NEPHROLITHIASIS, HX OF 06/24/2009   Past Medical History:  Diagnosis Date  . ALLERGIC RHINITIS 06/24/2009  . Anxiety 11/12/2011   Adequate for discharge   . Arthritis    "all my joints" (09/30/2013)  . Arthritis of foot, right, degenerative 04/15/2014  . Balance disorder 03/12/2013  . Benign neoplasm of cecum   . Benign neoplasm of descending colon   . CAD (coronary artery disease) 06/24/2009   5 stents placed in 2007    . Chronic anticoagulation   . Chronic pain syndrome 10/27/2009   of ankle, shoulders, low back.  sciatica.   . Closed fracture of right foot 10/17/2014  . CORONARY ARTERY DISEASE 06/24/2009   a. s/p multiple PCIs - In 2008 he had a  Taxus DES to the mild LAD, Endeavor DES to mid LCX and distal LCX. In January 2009 he had DES to distal LCX, mid LCX and proximal LCX. In November 2009 had BMS x 2 to the mid RCA. Cath 10/2011 with patent stents, noncardiac CP. LHC 01/2013: patent stents (noncardiac CP).  . DEGENERATIVE JOINT DISEASE 06/24/2009   Qualifier: Diagnosis of  By: [name], [name]   . Depression   . Depression with anxiety    Prior suicide attempt  . [place] DISEASE, LUMBAR 04/19/2010  . ERECTILE DYSFUNCTION, ORGANIC 05/30/2010  . Essential hypertension 06/24/2009   Qualifier: Diagnosis of  By: [name], [name]   . Fibromyalgia   . Fracture dislocation of ankle joint 09/02/2015  . Gait disorder 03/12/2013  . General weakness 07/14/2014  . GERD (gastroesophageal reflux disease) 09/08/2015  . Hand joint pain 06/10/2013  . Heart murmur   . Hepatitis C   . History of kidney stones   . Hyperlipidemia 07/15/2009   Qualifier: Diagnosis of  By: Aundra Dubin, MD, [place]    .  HYPERLIPIDEMIA-MIXED 07/15/2009  . HYPERTENSION 06/24/2009  . Insomnia 10/04/2011  . Irregular heart beat   . Left hip pain 03/12/2013   Injected under ultrasound guidance on June 24, 2013   . Major depression 09/13/2015  . Myocardial infarction ([place]) Jul 19, 2006  . Non-cardiac chest pain 10/2011, 01/2013  . Obesity   . Occult blood positive stool 10/17/2014  . Open ankle fracture 09/02/2015  . OSA (obstructive sleep apnea)    not using CPAP (09/30/2013)  . Pain of right thumb 04/03/2013  . Pneumonia   . PPD positive 04/08/2015  . Pre-ulcerative corn or callous 02/06/2013  . Rotator cuff tear arthropathy of both shoulders 06/10/2013   History of bilateral shoulder cuff surgery for rotator cuff tears. Reports increase in pain 09/11/2015 during physical therapy of the left shoulder.   [name] SCIATICA, LEFT 04/19/2010   Qualifier: Diagnosis of  By: [name], [name]   . Spinal stenosis in cervical region 09/26/2013  . Spinal stenosis, lumbar region, with neurogenic claudication 09/26/2013  . Type II diabetes mellitus ([place]) 07-19-2010   no meds in 09/2014.   [name] Uncontrolled type 2 DM with peripheral circulatory disorder ([place]) 10/04/2013  . URETHRAL STRICTURE 06/24/2009   self catheterizes.     Family History  Problem Relation Age of Onset  . Depression Mother   . Heart disease Mother   . Hypertension Mother   . Breast cancer Mother   . Diabetes Father   . Heart disease Father        CABG  . Hypertension Father   . Hyperlipidemia Father   . Prostate cancer Father   . Skin cancer Father   . Depression Brother        x 2  . Hypertension Brother        x2  . Healthy Son   . Heart disease Maternal Grandfather   . Early death Maternal Grandfather   . Heart attack Maternal Grandfather 07/20/63  . Early death Paternal Grandfather   . Coronary artery disease Other   . Hypertension Other   . Depression Other   . Healthy Son     Past Surgical History:  Procedure Laterality Date  . AMPUTATION Right 06/14/2016    Procedure: AMPUTATION BELOW KNEE;  Surgeon: Newt Minion, MD;  Location: [facility];  Service: Orthopedics;  Laterality: Right;  . ANKLE FUSION Right 04/15/2014   Procedure: Right Subtalar, Talonavicular Fusion;  Surgeon: Newt Minion, MD;  Location: [facility];  Service: Orthopedics;  Laterality: Right;  . ANKLE FUSION Right 04/18/2016   Procedure: Right Ankle Tibiocalcaneal Fusion;  Surgeon: Newt Minion, MD;  Location: [facility];  Service: Orthopedics;  Laterality: Right;  . ANTERIOR CERVICAL DECOMP/DISCECTOMY FUSION N/A 09/26/2013   Procedure: ANTERIOR CERVICAL DISCECTOMY FUSION C3-4, plate and screw fixation, allograft bone graft;  Surgeon: Jessy Oto, MD;  Location: [facility];  Service: Orthopedics;  Laterality: N/A;  . BACK SURGERY    . BELOW KNEE LEG AMPUTATION Right 06/14/2016  . CARDIAC CATHETERIZATION  X 1  . CARPAL TUNNEL RELEASE Bilateral   . COLONOSCOPY    . COLONOSCOPY N/A 10/22/2014   Procedure: COLONOSCOPY;  Surgeon: Lafayette Dragon, MD;  Location[facility] ENDOSCOPY;  Service: Endoscopy;  Laterality: N/A;  . CORONARY ANGIOPLASTY WITH STENT PLACEMENT     "I have 9 stents"  . ESOPHAGOGASTRODUODENOSCOPY N/A 10/19/2014   Procedure: ESOPHAGOGASTRODUODENOSCOPY (EGD);  Surgeon: Jerene Bears, MD;  Location: [facility] ENDOSCOPY;  Service: Endoscopy;  Laterality: N/A;  . FRACTURE SURGERY    . FUSION OF TALONAVICULAR JOINT Right 04/15/2014   dr duda  . HERNIA REPAIR     umbilical  . INGUINAL HERNIA REPAIR Right 05/11/2015   Procedure: LAPAROSCOPIC REPAIR RIGHT  INGUINAL HERNIA;  Surgeon: Greer Pickerel, MD;  Location: [place] [facility];  Service: General;  Laterality: Right;  . INSERTION OF MESH Right 05/11/2015   Procedure: INSERTION OF MESH;  Surgeon: Greer Pickerel, MD;  Location: [facility];  Service: General;  Laterality: Right;  . JOINT REPLACEMENT    . KNEE CARTILAGE SURGERY Right X 12   "~ 1/2 open; ~ 1/2 scopes"  . KNEE CARTILAGE SURGERY Left X 3   "3 scopes"  . LEFT HEART CATHETERIZATION WITH CORONARY ANGIOGRAM N/A  02/10/2013   Procedure: LEFT HEART CATHETERIZATION WITH CORONARY ANGIOGRAM;  Surgeon: Burnell Blanks, MD;  [facility] C[facility];  Service: Cardiovascular;  Laterality: N/A;  . LUMBAR LAMINECTOMY/DECOMPRESSION MICRODISCECTOMY N/A 01/27/2014   Procedure: CENTRAL LUMBAR LAMINECTOMY L4-5 AND L3-4;  Surgeon: Jessy Oto, MD;  Location: [facility];  Service: Orthopedics;  Laterality: N/A;  . ORIF ANKLE FRACTURE Right 09/02/2015   Procedure: OPEN REDUCTION INTERNAL FIXATION (ORIF) ANKLE FRACTURE;  Surgeon: Newt Minion, MD;  Location: [facility];  Service: Orthopedics;  Laterality: Right;  . PERIPHERALLY INSERTED CENTRAL CATHETER INSERTION  09/02/2015  . SHOULDER ARTHROSCOPY W/ ROTATOR CUFF REPAIR Bilateral    "3 on the right; 1 on the left"  . SKIN SPLIT GRAFT Right 10/01/2015   Procedure: RIGHT ANKLE APPLY SKIN GRAFT SPLIT THICKNESS;  Surgeon: Newt Minion, MD;  Location: [place];  Service: Orthopedics;  Laterality: Right;  . TONSILLECTOMY    . TOOTH EXTRACTION    . TOTAL HIP ARTHROPLASTY Right 04/17/2018  . TOTAL HIP ARTHROPLASTY Right 04/17/2018   Procedure: RIGHT TOTAL HIP ARTHROPLASTY;  Surgeon: Newt Minion, MD;  Location: [facility];  Service: Orthopedics;  Laterality: Right;  . TOTAL KNEE ARTHROPLASTY Bilateral 2008  . UMBILICAL HERNIA REPAIR     UHR  . URETHRAL DILATION  X 4  . VASECTOMY    . WISDOM TOOTH EXTRACTION     Social History   Occupational History  . Occupation: disabled since 2006 due to ortho. heart, psych    Employer: UNEMPLOYED  . Occupation: part time work as an Multimedia programmer, wrestling, and Holiday representative  Tobacco Use  . Smoking status: Former Smoker  Types: Cigars    Last attempt to quit: 08/28/2010    Years since quitting: 7.7  . Smokeless tobacco: Never Used  . Tobacco comment: 04/18/2016 "smoked 1 cigar/wk when I did smoke"  Substance and Sexual Activity  . Alcohol use: No    Alcohol/week: 0.0 standard drinks    Comment: rarely  . Drug  use: No  . Sexual activity: Not Currently

## 2018-05-24 ENCOUNTER — Encounter (INDEPENDENT_AMBULATORY_CARE_PROVIDER_SITE_OTHER): Payer: Self-pay | Admitting: Physician Assistant

## 2018-05-24 DIAGNOSIS — Z96641 Presence of right artificial hip joint: Secondary | ICD-10-CM | POA: Diagnosis not present

## 2018-05-24 DIAGNOSIS — I2510 Atherosclerotic heart disease of native coronary artery without angina pectoris: Secondary | ICD-10-CM | POA: Diagnosis not present

## 2018-05-24 DIAGNOSIS — G894 Chronic pain syndrome: Secondary | ICD-10-CM | POA: Diagnosis not present

## 2018-05-24 DIAGNOSIS — R5381 Other malaise: Secondary | ICD-10-CM | POA: Diagnosis not present

## 2018-05-27 DIAGNOSIS — M797 Fibromyalgia: Secondary | ICD-10-CM | POA: Diagnosis not present

## 2018-05-27 DIAGNOSIS — M5432 Sciatica, left side: Secondary | ICD-10-CM | POA: Diagnosis not present

## 2018-05-27 DIAGNOSIS — R2689 Other abnormalities of gait and mobility: Secondary | ICD-10-CM | POA: Diagnosis not present

## 2018-05-27 DIAGNOSIS — I110 Hypertensive heart disease with heart failure: Secondary | ICD-10-CM | POA: Diagnosis not present

## 2018-05-27 DIAGNOSIS — M48062 Spinal stenosis, lumbar region with neurogenic claudication: Secondary | ICD-10-CM | POA: Diagnosis not present

## 2018-05-27 DIAGNOSIS — T8789 Other complications of amputation stump: Secondary | ICD-10-CM | POA: Diagnosis not present

## 2018-05-27 DIAGNOSIS — G894 Chronic pain syndrome: Secondary | ICD-10-CM | POA: Diagnosis not present

## 2018-05-27 DIAGNOSIS — M150 Primary generalized (osteo)arthritis: Secondary | ICD-10-CM | POA: Diagnosis not present

## 2018-05-27 DIAGNOSIS — M4802 Spinal stenosis, cervical region: Secondary | ICD-10-CM | POA: Diagnosis not present

## 2018-05-27 DIAGNOSIS — M5136 Other intervertebral disc degeneration, lumbar region: Secondary | ICD-10-CM | POA: Diagnosis not present

## 2018-05-27 DIAGNOSIS — Z4789 Encounter for other orthopedic aftercare: Secondary | ICD-10-CM | POA: Diagnosis not present

## 2018-05-27 DIAGNOSIS — E11610 Type 2 diabetes mellitus with diabetic neuropathic arthropathy: Secondary | ICD-10-CM | POA: Diagnosis not present

## 2018-05-27 DIAGNOSIS — I2510 Atherosclerotic heart disease of native coronary artery without angina pectoris: Secondary | ICD-10-CM | POA: Diagnosis not present

## 2018-05-28 ENCOUNTER — Telehealth (INDEPENDENT_AMBULATORY_CARE_PROVIDER_SITE_OTHER): Payer: Self-pay | Admitting: Orthopedic Surgery

## 2018-05-28 ENCOUNTER — Other Ambulatory Visit: Payer: Self-pay

## 2018-05-28 DIAGNOSIS — M48062 Spinal stenosis, lumbar region with neurogenic claudication: Secondary | ICD-10-CM | POA: Diagnosis not present

## 2018-05-28 DIAGNOSIS — M5136 Other intervertebral disc degeneration, lumbar region: Secondary | ICD-10-CM | POA: Diagnosis not present

## 2018-05-28 DIAGNOSIS — M797 Fibromyalgia: Secondary | ICD-10-CM | POA: Diagnosis not present

## 2018-05-28 DIAGNOSIS — M4802 Spinal stenosis, cervical region: Secondary | ICD-10-CM | POA: Diagnosis not present

## 2018-05-28 DIAGNOSIS — M5432 Sciatica, left side: Secondary | ICD-10-CM | POA: Diagnosis not present

## 2018-05-28 DIAGNOSIS — G894 Chronic pain syndrome: Secondary | ICD-10-CM | POA: Diagnosis not present

## 2018-05-28 DIAGNOSIS — T8789 Other complications of amputation stump: Secondary | ICD-10-CM | POA: Diagnosis not present

## 2018-05-28 DIAGNOSIS — M150 Primary generalized (osteo)arthritis: Secondary | ICD-10-CM | POA: Diagnosis not present

## 2018-05-28 DIAGNOSIS — I110 Hypertensive heart disease with heart failure: Secondary | ICD-10-CM | POA: Diagnosis not present

## 2018-05-28 NOTE — Telephone Encounter (Signed)
Received voicemail message from [facility] -(PT) from [facility] needing verbal orders for HHPT 2 Wk 4. Flore also wanted to know if there is any hip precautions. The number to contact Flore is [phone]

## 2018-05-28 NOTE — Patient Outreach (Signed)
[place] [facility]) Care Management  05/28/2018  CORDELLE DAHMEN Sr. 05-Nov-1950 [id number]  Transition of care  Referral date: 05/28/18 Referral source: discharged from [facility] rehab on 05/25/2018 Insurance: [employer] Attempt #1  Telephone call to patient regarding transition of care referral. Unable to reach patient. HIPAA compliant voice message left with call back phone number.   PLAN: RNCM will attempt 2nd telephone call to patient within 4 business days. RNCM will send outreach letter.   Quinn Plowman RN,BSN, [place] Telephonic  [phone]

## 2018-05-29 ENCOUNTER — Ambulatory Visit (INDEPENDENT_AMBULATORY_CARE_PROVIDER_SITE_OTHER): Payer: Medicare HMO | Admitting: Specialist

## 2018-05-29 ENCOUNTER — Other Ambulatory Visit: Payer: Self-pay

## 2018-05-29 NOTE — Patient Outreach (Signed)
[facility]) Care Management  05/29/2018  Travis [name] Sr. 08/23/1950 [id number]  Transition of care  Referral date: 05/28/18 Referral source: discharged from [facility] and rehab on 05/25/2018 Insurance: [employer] Attempt #2  Telephone call to patient regarding transition of care follow up. Unable to reach patient or leave voice message.  Voice mail would not record message.    PLAN:  RNCM will attempt 3rd telephone outreach to patient within 4 business days,  Quinn Plowman [facility] Telephonic  [phone]

## 2018-05-29 NOTE — Telephone Encounter (Signed)
PT (Travis [name]) was called today and informed of precautions for patient therapy and mobility. PT verbally understood orders.

## 2018-05-31 ENCOUNTER — Other Ambulatory Visit: Payer: Self-pay

## 2018-05-31 NOTE — Patient Outreach (Signed)
[facility]) Care Management  05/31/2018  Travis MCEACHRON Sr. [birth year] [id number]   Transition of care  Referral date:05/28/18 Referral source:discharged from [place] and rehab on 05/25/2018 Insurance:[employer]  Telephone call to patient regarding transition of care referral. HIPAA verified with patient.  Explained reason for call.  Patient states he was discharged from the skilled nursing facility on 05/25/18.  He states he recently had a right total hip replacement.  Patient states he is doing fine. He states he has a follow up with his primary MD on 06/03/18 and a follow up with the surgeon on 06/20/18. Patient states he has transportation to his appointments.  Patient states he is receiving physical therapy with [facility].  RNCM discussed signs and symptoms of infection with patient.   Patient states his incision is healing well and denies any signs of infection.  Patient states he is taking his medication as prescribed.  RNCM discussed and offered ongoing transition of care follow up.  Patient declined.   RNCM advised patient to notify MD of any changes in condition prior to scheduled appointment. RNCM provided contact name and number: [phone] or main office number [phone] and 24 hour nurse advise line [phone] by mail as requested by patient. [name]  RNCM verified patient aware of 911 services for urgent/ emergent needs.,  PLAN:  RNCM will close patient due to patient being assessed and having no further needs.  RNCM will send patients primary MD closure letter.   Quinn Plowman RN,BSN,CCM [facility] Telephonic  [phone]

## 2018-06-03 ENCOUNTER — Other Ambulatory Visit: Payer: Self-pay | Admitting: Internal Medicine

## 2018-06-03 ENCOUNTER — Ambulatory Visit (INDEPENDENT_AMBULATORY_CARE_PROVIDER_SITE_OTHER): Payer: Medicare HMO | Admitting: Internal Medicine

## 2018-06-03 ENCOUNTER — Encounter: Payer: Self-pay | Admitting: Internal Medicine

## 2018-06-03 ENCOUNTER — Other Ambulatory Visit (INDEPENDENT_AMBULATORY_CARE_PROVIDER_SITE_OTHER): Payer: Medicare HMO

## 2018-06-03 VITALS — BP 126/74 | HR 82 | Ht 72.0 in | Wt 334.0 lb

## 2018-06-03 DIAGNOSIS — Z0000 Encounter for general adult medical examination without abnormal findings: Secondary | ICD-10-CM

## 2018-06-03 DIAGNOSIS — R739 Hyperglycemia, unspecified: Secondary | ICD-10-CM

## 2018-06-03 DIAGNOSIS — R6889 Other general symptoms and signs: Secondary | ICD-10-CM | POA: Diagnosis not present

## 2018-06-03 LAB — BASIC METABOLIC PANEL
BUN: 14 mg/dL (ref 6–23)
CO2: 26 mEq/L (ref 19–32)
Calcium: 9.3 mg/dL (ref 8.4–10.5)
Chloride: 102 mEq/L (ref 96–112)
Creatinine, Ser: 0.99 mg/dL (ref 0.40–1.50)
GFR: 80.38 mL/min (ref >60.00)
Glucose, Bld: 100 mg/dL — ABNORMAL HIGH (ref 70–99)
Potassium: 4.2 mEq/L (ref 3.5–5.1)
Sodium: 138 mEq/L (ref 135–145)

## 2018-06-03 LAB — LIPID PANEL
Cholesterol: 124 mg/dL (ref 0–200)
HDL: 39 mg/dL — ABNORMAL LOW (ref >39.00)
LDL Cholesterol: 61 mg/dL (ref 0–99)
NONHDL: 85.02
Total CHOL/HDL Ratio: 3
Triglycerides: 119 mg/dL (ref 0.0–149.0)
VLDL: 23.8 mg/dL (ref 0.0–40.0)

## 2018-06-03 LAB — URINALYSIS, ROUTINE W REFLEX MICROSCOPIC
Bilirubin Urine: NEGATIVE
Hgb urine dipstick: NEGATIVE
Leukocytes, UA: NEGATIVE
NITRITE: NEGATIVE
RBC / HPF: NONE SEEN (ref 0–?)
Specific Gravity, Urine: 1.015 (ref 1.000–1.030)
Total Protein, Urine: NEGATIVE
Urine Glucose: NEGATIVE
Urobilinogen, UA: 0.2 (ref 0.0–1.0)
pH: 6 (ref 5.0–8.0)

## 2018-06-03 LAB — TSH: TSH: 1.69 u[IU]/mL (ref 0.35–4.50)

## 2018-06-03 LAB — CBC WITH DIFFERENTIAL/PLATELET
Basophils Absolute: 0.1 10*3/uL (ref 0.0–0.1)
Basophils Relative: 1.1 % (ref 0.0–3.0)
EOS PCT: 7.2 % — AB (ref 0.0–5.0)
Eosinophils Absolute: 0.3 10*3/uL (ref 0.0–0.7)
HCT: 36.5 % — ABNORMAL LOW (ref 39.0–52.0)
Hemoglobin: 12.2 g/dL — ABNORMAL LOW (ref 13.0–17.0)
Lymphocytes Relative: 30.5 % (ref 12.0–46.0)
Lymphs Abs: 1.5 10*3/uL (ref 0.7–4.0)
MCHC: 33.4 g/dL (ref 30.0–36.0)
MCV: 85.6 fl (ref 78.0–100.0)
MONO ABS: 0.4 10*3/uL (ref 0.1–1.0)
Monocytes Relative: 8.7 % (ref 3.0–12.0)
Neutro Abs: 2.5 10*3/uL (ref 1.4–7.7)
Neutrophils Relative %: 52.5 % (ref 43.0–77.0)
Platelets: 288 10*3/uL (ref 150.0–400.0)
RBC: 4.27 Mil/uL (ref 4.22–5.81)
RDW: 15.1 % (ref 11.5–15.5)
WBC: 4.9 10*3/uL (ref 4.0–10.5)

## 2018-06-03 LAB — HEPATIC FUNCTION PANEL
ALT: 16 U/L (ref 0–53)
AST: 18 U/L (ref 0–37)
Albumin: 4 g/dL (ref 3.5–5.2)
Alkaline Phosphatase: 82 U/L (ref 39–117)
Bilirubin, Direct: 0.1 mg/dL (ref 0.0–0.3)
Total Bilirubin: 0.4 mg/dL (ref 0.2–1.2)
Total Protein: 6.9 g/dL (ref 6.0–8.3)

## 2018-06-03 LAB — MICROALBUMIN / CREATININE URINE RATIO
Creatinine,U: 166.1 mg/dL
MICROALB/CREAT RATIO: 1 mg/g (ref 0.0–30.0)
Microalb, Ur: 1.6 mg/dL (ref 0.0–1.9)

## 2018-06-03 LAB — HEMOGLOBIN A1C: Hgb A1c MFr Bld: 5.2 % (ref 4.6–6.5)

## 2018-06-03 LAB — PSA: PSA: 0.73 ng/mL (ref 0.10–4.00)

## 2018-06-03 NOTE — Patient Instructions (Signed)

## 2018-06-03 NOTE — Telephone Encounter (Signed)
Done erx 

## 2018-06-03 NOTE — Assessment & Plan Note (Signed)
stable overall by history and exam, recent data reviewed with pt, and pt to continue medical treatment as before,  to f/u any worsening symptoms or concerns,, for a1c with lab

## 2018-06-03 NOTE — Progress Notes (Signed)
Subjective:    Patient ID: Travis [name]., male    DOB: 1951/05/18, 68 y.o.   MRN: [id number]  HPI    Here for wellness and f/u;  Overall doing ok;  Pt denies Chest pain, worsening SOB, DOE, wheezing, orthopnea, PND, worsening LE edema, palpitations, dizziness or syncope.  Pt denies neurological change such as new headache, facial or extremity weakness except for left lower back pain related LLE weakness, and complicates his PT .  Pt denies polydipsia, polyuria, or low sugar symptoms. Pt states overall good compliance with treatment and medications, good tolerability, and has been trying to follow appropriate diet.  Pt denies worsening depressive symptoms, suicidal ideation or panic, though has ongoing mild chronic depression. No fever, night sweats, wt loss, loss of appetite, or other constitutional symptoms.  Pt states good ability with ADL's, has low fall risk, home safety reviewed and adequate, no other significant changes in hearing or vision.  Walks with walker today for stability as much difficulty with ambulation with left leg weakness, s/p recent right hip replacement and prior Right BKA with prosthesis Past Medical History:  Diagnosis Date  . ALLERGIC RHINITIS 06/24/2009  . Anxiety 11/12/2011   Adequate for discharge   . Arthritis    "all my joints" (09/30/2013)  . Arthritis of foot, right, degenerative 04/15/2014  . Balance disorder 03/12/2013  . Benign neoplasm of cecum   . Benign neoplasm of descending colon   . CAD (coronary artery disease) 06/24/2009   5 stents placed in 2007    . Chronic anticoagulation   . Chronic pain syndrome 10/27/2009   of ankle, shoulders, low back.  sciatica.   . Closed fracture of right foot 10/17/2014  . CORONARY ARTERY DISEASE 06/24/2009   a. s/p multiple PCIs - In 2008 he had a Taxus DES to the mild LAD, Endeavor DES to mid LCX and distal LCX. In January 2009 he had DES to distal LCX, mid LCX and proximal LCX. In November 2009 had BMS x 2 to the mid  RCA. Cath 10/2011 with patent stents, noncardiac CP. LHC 01/2013: patent stents (noncardiac CP).  . DEGENERATIVE JOINT DISEASE 06/24/2009   Qualifier: Diagnosis of  By: [name], [name]   . Depression   . Depression with anxiety    Prior suicide attempt  . [place] DISEASE, LUMBAR 04/19/2010  . ERECTILE DYSFUNCTION, ORGANIC 05/30/2010  . Essential hypertension 06/24/2009   Qualifier: Diagnosis of  By: [name], [name]   . Fibromyalgia   . Fracture dislocation of ankle joint 09/02/2015  . Gait disorder 03/12/2013  . General weakness 07/14/2014  . GERD (gastroesophageal reflux disease) 09/08/2015  . Hand joint pain 06/10/2013  . Heart murmur   . Hepatitis C   . History of kidney stones   . Hyperlipidemia 07/15/2009   Qualifier: Diagnosis of  By: Aundra Dubin, MD, [place]    . HYPERLIPIDEMIA-MIXED 07/15/2009  . HYPERTENSION 06/24/2009  . Insomnia 10/04/2011  . Irregular heart beat   . Left hip pain 03/12/2013   Injected under ultrasound guidance on June 24, 2013   . Major depression 09/13/2015  . Myocardial infarction ([place]) 2008  . Non-cardiac chest pain 10/2011, 01/2013  . Obesity   . Occult blood positive stool 10/17/2014  . Open ankle fracture 09/02/2015  . OSA (obstructive sleep apnea)    not using CPAP (09/30/2013)  . Pain of right thumb 04/03/2013  . Pneumonia   . PPD positive 04/08/2015  . Pre-ulcerative corn or callous  02/06/2013  . Rotator cuff tear arthropathy of both shoulders 06/10/2013   History of bilateral shoulder cuff surgery for rotator cuff tears. Reports increase in pain 09/11/2015 during physical therapy of the left shoulder.   [name] SCIATICA, LEFT 04/19/2010   Qualifier: Diagnosis of  By: [name], [name]   . Spinal stenosis in cervical region 09/26/2013  . Spinal stenosis, lumbar region, with neurogenic claudication 09/26/2013  . Type II diabetes mellitus ([place]) 2012   no meds in 09/2014.   [name] Uncontrolled type 2 DM with peripheral circulatory disorder ([place]) 10/04/2013  . URETHRAL STRICTURE  06/24/2009   self catheterizes.    Past Surgical History:  Procedure Laterality Date  . AMPUTATION Right 06/14/2016   Procedure: AMPUTATION BELOW KNEE;  Surgeon: Newt Minion, MD;  Location: [place];  Service: Orthopedics;  Laterality: Right;  . ANKLE FUSION Right 04/15/2014   Procedure: Right Subtalar, Talonavicular Fusion;  Surgeon: Newt Minion, MD;  Location: [place];  Service: Orthopedics;  Laterality: Right;  . ANKLE FUSION Right 04/18/2016   Procedure: Right Ankle Tibiocalcaneal Fusion;  Surgeon: Newt Minion, MD;  Location: [place];  Service: Orthopedics;  Laterality: Right;  . ANTERIOR CERVICAL DECOMP/DISCECTOMY FUSION N/A 09/26/2013   Procedure: ANTERIOR CERVICAL DISCECTOMY FUSION C3-4, plate and screw fixation, allograft bone graft;  Surgeon: Jessy Oto, MD;  Location: [place];  Service: Orthopedics;  Laterality: N/A;  . BACK SURGERY    . BELOW KNEE LEG AMPUTATION Right 06/14/2016  . CARDIAC CATHETERIZATION  X 1  . CARPAL TUNNEL RELEASE Bilateral   . COLONOSCOPY    . COLONOSCOPY N/A 10/22/2014   Procedure: COLONOSCOPY;  Surgeon: Lafayette Dragon, MD;  Location: [facility] ENDOSCOPY;  Service: Endoscopy;  Laterality: N/A;  . CORONARY ANGIOPLASTY WITH STENT PLACEMENT     "I have 9 stents"  . ESOPHAGOGASTRODUODENOSCOPY N/A 10/19/2014   Procedure: ESOPHAGOGASTRODUODENOSCOPY (EGD);  Surgeon: Jerene Bears, MD;  Location: [facility] ENDOSCOPY;  Service: Endoscopy;  Laterality: N/A;  . FRACTURE SURGERY    . FUSION OF TALONAVICULAR JOINT Right 04/15/2014   dr duda  . HERNIA REPAIR     umbilical  . INGUINAL HERNIA REPAIR Right 05/11/2015   Procedure: LAPAROSCOPIC REPAIR RIGHT  INGUINAL HERNIA;  Surgeon: Greer Pickerel, MD;  Location: [place];  Service: General;  Laterality: Right;  . INSERTION OF MESH Right 05/11/2015   Procedure: INSERTION OF MESH;  Surgeon: Greer Pickerel, MD;  Location: [place];  Service: General;  Laterality: Right;  . JOINT REPLACEMENT    . KNEE CARTILAGE SURGERY Right X 12   "~ 1/2 open; ~ 1/2  scopes"  . KNEE CARTILAGE SURGERY Left X 3   "3 scopes"  . LEFT HEART CATHETERIZATION WITH CORONARY ANGIOGRAM N/A 02/10/2013   Procedure: LEFT HEART CATHETERIZATION WITH CORONARY ANGIOGRAM;  Surgeon: Burnell Blanks, MD;  Location[facility] CATH LAB;  Service: Cardiovascular;  Laterality: N/A;  . LUMBAR LAMINECTOMY/DECOMPRESSION MICRODISCECTOMY N/A 01/27/2014   Procedure: CENTRAL LUMBAR LAMINECTOMY L4-5 AND L3-4;  Surgeon: Jessy Oto, MD;  Location: [place];  Service: Orthopedics;  Laterality: N/A;  . ORIF ANKLE FRACTURE Right 09/02/2015   Procedure: OPEN REDUCTION INTERNAL FIXATION (ORIF) ANKLE FRACTURE;  Surgeon: Newt Minion, MD;  Location: [place];  Service: Orthopedics;  Laterality: Right;  . PERIPHERALLY INSERTED CENTRAL CATHETER INSERTION  09/02/2015  . SHOULDER ARTHROSCOPY W/ ROTATOR CUFF REPAIR Bilateral    "3 on the right; 1 on the left"  . SKIN SPLIT GRAFT Right 10/01/2015  Procedure: RIGHT ANKLE APPLY SKIN GRAFT SPLIT THICKNESS;  Surgeon: Newt Minion, MD;  Location: [place];  Service: Orthopedics;  Laterality: Right;  . TONSILLECTOMY    . TOOTH EXTRACTION    . TOTAL HIP ARTHROPLASTY Right 04/17/2018  . TOTAL HIP ARTHROPLASTY Right 04/17/2018   Procedure: RIGHT TOTAL HIP ARTHROPLASTY;  Surgeon: Newt Minion, MD;  Location: [place];  Service: Orthopedics;  Laterality: Right;  . TOTAL KNEE ARTHROPLASTY Bilateral 2008  . UMBILICAL HERNIA REPAIR     UHR  . URETHRAL DILATION  X 4  . VASECTOMY    . WISDOM TOOTH EXTRACTION      reports that he quit smoking about 7 years ago. His smoking use included cigars. He has never used smokeless tobacco. He reports that he does not drink alcohol or use drugs. family history includes Breast cancer in his mother; Coronary artery disease in an other family member; Depression in his brother, mother, and another family member; Diabetes in his father; Early death in his maternal grandfather and paternal grandfather; Healthy in his son and son; Heart  attack (age of onset: 54) in his maternal grandfather; Heart disease in his father, maternal grandfather, and mother; Hyperlipidemia in his father; Hypertension in his brother, father, mother, and another family member; Prostate cancer in his father; Skin cancer in his father. No Known Allergies Current Outpatient Medications on File Prior to Visit  Medication Sig Dispense Refill  . ARIPiprazole (ABILIFY) 5 MG tablet Take 1 tablet (5 mg total) by mouth daily. Take one tablet at bedtime (Patient taking differently: Take 5 mg by mouth at bedtime. ) 90 tablet 0  . aspirin 81 MG tablet Take 81 mg by mouth daily.    [name] atorvastatin (LIPITOR) 20 MG tablet Take 1 tablet (20 mg total) by mouth daily. 90 tablet 3  . benztropine (COGENTIN) 0.5 MG tablet Take 1 tablet (0.5 mg total) by mouth at bedtime. 90 tablet 0  . cyclobenzaprine (FLEXERIL) 5 MG tablet TAKE ONE TABLET BY MOUTH THREE TIMES DAILY AS NEEDED muscle spasm 30 tablet 2  . DULoxetine (CYMBALTA) 30 MG capsule Take 1 capsule (30 mg total) by mouth 2 (two) times daily. Take one capsule daily for week and than twice daily (Patient taking differently: Take 30 mg by mouth 2 (two) times daily. ) 60 capsule 2  . gabapentin (NEURONTIN) 800 MG tablet Take 800 mg by mouth 3 (three) times daily.    . hydrochlorothiazide (HYDRODIURIL) 25 MG tablet Take 1 tablet (25 mg total) by mouth daily. (Patient taking differently: Take 25 mg by mouth at bedtime. ) 90 tablet 3  . Melatonin 5 MG TABS Take 20 mg by mouth at bedtime.     . mirabegron ER (MYRBETRIQ) 50 MG TB24 tablet Take 50 mg by mouth at bedtime.     . Omega-3 Fatty Acids (FISH OIL) 1000 MG CAPS Take 1,000 mg by mouth at bedtime.     [name] oxyCODONE-acetaminophen (PERCOCET) 10-325 MG tablet Take 1 tablet by mouth every 6 (six) hours as needed for pain.    [name] oxyCODONE-acetaminophen (PERCOCET/ROXICET) 5-325 MG tablet Take 1 tablet by mouth every 4 (four) hours as needed. 30 tablet 0  . pantoprazole (PROTONIX) 40 MG  tablet TAKE ONE TABLET BY MOUTH EVERY DAY 30 tablet 2  . prasugrel (EFFIENT) 5 MG TABS tablet TAKE ONE TABLET BY MOUTH EVERY DAY (Patient taking differently: Take 5 mg by mouth at bedtime. ) 90 tablet 3  . traZODone (DESYREL) 150 MG  tablet Take 1 tablet (150 mg total) by mouth at bedtime. 90 tablet 0   No current facility-administered medications on file prior to visit.    Review of Systems  Constitutional: Negative for other unusual diaphoresis or sweats HENT: Negative for ear discharge or swelling Eyes: Negative for other worsening visual disturbances Respiratory: Negative for stridor or other swelling  Gastrointestinal: Negative for worsening distension or other blood Genitourinary: Negative for retention or other urinary change Musculoskeletal: Negative for other MSK pain or swelling Skin: Negative for color change or other new lesions Neurological: Negative for worsening tremors and other numbness  Psychiatric/Behavioral: Negative for worsening agitation or other fatigue All other system neg per pt    Objective:   Physical Exam BP 126/74   Pulse 82   Ht 6' (1.829 m)   Wt (!) 334 lb (151.5 kg)   SpO2 96%   BMI 45.30 kg/m  VS noted, morbid obese, s/p right bka Constitutional: Pt is oriented to person, place, and time. Appears well-developed and well-nourished, in no significant distress and comfortable Head: Normocephalic and atraumatic  Eyes: Conjunctivae and EOM are normal. Pupils are equal, round, and reactive to light Right Ear: External ear normal without discharge Left Ear: External ear normal without discharge Nose: Nose without discharge or deformity Mouth/Throat: Oropharynx is without other ulcerations and moist  Neck: Normal range of motion. Neck supple. No JVD present. No tracheal deviation present or significant neck LA or mass Cardiovascular: Normal rate, regular rhythm, normal heart sounds and intact distal pulses.   Pulmonary/Chest: WOB normal and breath sounds  without rales or wheezing  Abdominal: Soft. Bowel sounds are normal. NT. No HSM  Musculoskeletal: Normal range of motion. Exhibits trace bilat edema Lymphadenopathy: Has no other cervical adenopathy.  Neurological: Pt is alert and oriented to person, place, and time. Pt has normal reflexes. No cranial nerve deficit. Motor grossly intact, Gait intact Skin: Skin is warm and dry. No rash noted or new ulcerations Psychiatric:  Has normal mood and affect. Behavior is normal without agitation No other exam findings  Lab Results  Component Value Date   WBC 7.4 04/20/2018   HGB 9.1 (L) 04/20/2018   HCT 27.7 (L) 04/20/2018   PLT 176 04/20/2018   GLUCOSE 119 (H) 04/20/2018   CHOL 129 01/10/2018   TRIG 88.0 01/10/2018   HDL 53.30 01/10/2018   LDLCALC 58 01/10/2018   ALT 17 04/11/2018   AST 26 04/11/2018   NA 133 (L) 04/20/2018   K 3.2 (L) 04/20/2018   CL 95 (L) 04/20/2018   CREATININE 1.06 04/20/2018   BUN 11 04/20/2018   CO2 26 04/20/2018   TSH 1.91 01/10/2018   PSA 0.96 01/10/2018   INR 1.17 06/14/2016   HGBA1C 5.5 01/10/2018   MICROALBUR 12.4 (H) 11/10/2015        Assessment & Plan:

## 2018-06-03 NOTE — Assessment & Plan Note (Signed)

## 2018-06-04 ENCOUNTER — Ambulatory Visit (INDEPENDENT_AMBULATORY_CARE_PROVIDER_SITE_OTHER): Payer: Medicare HMO | Admitting: Specialist

## 2018-06-04 ENCOUNTER — Encounter (INDEPENDENT_AMBULATORY_CARE_PROVIDER_SITE_OTHER): Payer: Self-pay | Admitting: Specialist

## 2018-06-04 VITALS — BP 143/88 | HR 91 | Ht 73.0 in | Wt 330.0 lb

## 2018-06-04 DIAGNOSIS — M48062 Spinal stenosis, lumbar region with neurogenic claudication: Secondary | ICD-10-CM | POA: Diagnosis not present

## 2018-06-04 DIAGNOSIS — M5416 Radiculopathy, lumbar region: Secondary | ICD-10-CM

## 2018-06-04 DIAGNOSIS — R6889 Other general symptoms and signs: Secondary | ICD-10-CM | POA: Diagnosis not present

## 2018-06-04 NOTE — Progress Notes (Signed)
Office Visit Note   Patient: Travis OSCAR Sr.           Date of Birth: 1950-05-26           MRN: [id number] Visit Date: 06/04/2018              Requested by: [name], MD [place], [place] [address] PCP: [name], MD   Assessment & Plan: Visit Diagnoses:  1. Spinal stenosis of lumbar region with neurogenic claudication   2. Lumbar radiculopathy     Plan: Avoid bending, stooping and avoid lifting weights greater than 10 lbs. Avoid prolong standing and walking. Order for a new walker with wheels. Surgery scheduling secretary Kandice Hams, will call you in the next week to schedule for surgery.  Surgery recommended is a one level left lumbar L4-5 redo partial hemilaminectomy with foramenotomy of left L4 this would be done with microscope.  Take hydrocodone for for pain. Risk of surgery includes risk of infection 1 in 200 patients, bleeding 1/2% chance you would need a transfusion.   Risk to the nerves is one in 10,000.  Expect improved walking and standing tolerance. Expect relief of leg pain but numbness may persist depending on the length and degree of pressure that has been present.  Follow-Up Instructions: Return in about 4 weeks (around 07/02/2018).   Orders:  No orders of the defined types were placed in this encounter.  No orders of the defined types were placed in this encounter.     Procedures: No procedures performed   Clinical Data: No additional findings.   Subjective: Chief Complaint  Patient presents with  . Spine - Pain, Follow-up    68 year old male with history of lumbar decompression with central laminectomies L3-4 and L4-5 in 08/2015. He has been to PT for treatment recently of DDD and spondylosis with spinal stenosis changes. ESIs with only temporary relief up to 4 weeks. He is having pain in the left buttock and radiates into the left buttock with left leg weakness, the left knee  Wants to give away. He does  have bowel incontinence episodes and urgency. He has urinary urgency but not incontinence of urine. Standing and walking about 350' at the nursing home, released on 1/4 from [place] and he is  Now at home. He reports was to have home PT but there has been no contact by the [facility] agency since the initial intake interview. Pain with bending and stooping with pain in the left buttock and hip. There is no numbness or paresthesias. There is weakness.    Review of Systems  Constitutional: Positive for unexpected weight change. Negative for activity change, appetite change, chills, diaphoresis, fatigue and fever.  HENT: Positive for congestion, rhinorrhea, sinus pressure and sinus pain. Negative for postnasal drip.   Eyes: Negative for pain, discharge, redness, itching and visual disturbance.  Respiratory: Positive for shortness of breath. Negative for apnea, cough, choking, chest tightness, wheezing and stridor.   Cardiovascular: Negative.  Negative for chest pain, palpitations and leg swelling.  Gastrointestinal: Negative.   Endocrine: Positive for polydipsia and polyuria. Negative for cold intolerance, heat intolerance and polyphagia.  Genitourinary: Positive for frequency, penile pain and urgency. Negative for difficulty urinating, dysuria, enuresis, flank pain, genital sores and hematuria.  Musculoskeletal: Positive for back pain, neck pain and neck stiffness. Negative for arthralgias, gait problem, joint swelling and myalgias.  Skin: Negative.   Allergic/Immunologic: Negative for environmental allergies, food allergies  and immunocompromised state.  Neurological: Negative for dizziness, tremors, seizures, syncope, facial asymmetry, speech difficulty, weakness, light-headedness, numbness and headaches.  Hematological: Negative for adenopathy.  Psychiatric/Behavioral: Negative for agitation, behavioral problems, confusion, decreased concentration, dysphoric mood, hallucinations, self-injury, sleep  disturbance and suicidal ideas. The patient is not nervous/anxious and is not hyperactive.      Objective: Vital Signs: BP (!) 143/88   Pulse 91   Ht 6' 1"  (1.854 m)   Wt (!) 330 lb (149.7 kg)   BMI 43.54 kg/m   Physical Exam Constitutional:      Appearance: He is well-developed.  HENT:     Head: Normocephalic and atraumatic.  Eyes:     Pupils: Pupils are equal, round, and reactive to light.  Neck:     Musculoskeletal: Normal range of motion and neck supple.  Pulmonary:     Effort: Pulmonary effort is normal.     Breath sounds: Normal breath sounds.  Abdominal:     General: Bowel sounds are normal.     Palpations: Abdomen is soft.  Skin:    General: Skin is warm and dry.  Neurological:     Mental Status: He is alert and oriented to person, place, and time.  Psychiatric:        Behavior: Behavior normal.        Thought Content: Thought content normal.        Judgment: Judgment normal.     Back Exam   Tenderness  The patient is experiencing tenderness in the lumbar.  Range of Motion  Extension: abnormal  Flexion: abnormal  Lateral bend right: abnormal  Lateral bend left: abnormal  Rotation right: abnormal  Rotation left: abnormal   Muscle Strength  Right Quadriceps:  5/5  Left Quadriceps:  4/5  Right Hamstrings:  5/5  Left Hamstrings:  5/5   Tests  Straight leg raise right: negative Straight leg raise left: negative  Reflexes  Patellar: 0/4 Achilles: 0/4 Babinski's sign: normal   Other  Toe walk: abnormal Heel walk: abnormal Gait: abnormal  Erythema: no back redness Scars: present  Comments:  Right Residual limb BKA with prosthesis intact.       Specialty Comments:  No specialty comments available.  Imaging: No results found.   PMFS History: Patient Active Problem List   Diagnosis Date Noted  . Spinal stenosis in cervical region 09/26/2013    Priority: High    Class: Chronic  . Spinal stenosis, lumbar region, with neurogenic  claudication 09/26/2013    Priority: High    Class: Chronic  . Status post THR (total hip replacement) 04/17/2018  . Unilateral primary osteoarthritis, right hip   . Morbid obesity with BMI of 40.0-44.9, adult ([place]) 02/28/2018  . Myocardial infarction ([place]) 02/25/2018  . Coronary artery disease involving native coronary artery of native heart 02/25/2018  . PAD (peripheral artery disease) ([place]) 02/25/2018  . S/P BKA (below knee amputation) unilateral, right ([place]) 02/25/2018  . Sacroiliitis ([place]) 02/25/2018  . SOB (shortness of breath) 01/03/2018  . Acute on chronic heart failure ([place]) 01/03/2018  . Severe right groin pain 12/20/2017  . Morbid obesity ([place]) 10/09/2017  . Exposure to sexually transmitted disease (STD) 07/14/2017  . Low back pain 07/13/2017  . Leukoplakia, tongue 01/26/2017  . Acute sinus infection 10/20/2016  . Hyperglycemia 10/20/2016  . Skin lesion 10/20/2016  . S/P unilateral BKA (below knee amputation), right ([place]) 06/14/2016  . Charcot foot due to diabetes mellitus ([place])   . Charcot's arthropathy associated with type  2 diabetes mellitus ([place]) 04/11/2016  . Preventative health care 11/05/2015  . Major depression 09/13/2015  . S/P TKR (total knee replacement) bilaterally 09/13/2015  . GERD (gastroesophageal reflux disease) 09/08/2015  . S/P laparoscopic hernia repair 05/11/2015  . PPD positive 04/08/2015  . Benign neoplasm of descending colon   . Benign neoplasm of cecum   . AKI (acute kidney injury) ([place]) 10/18/2014  . Acute blood loss anemia   . Chronic anticoagulation   . Occult blood positive stool 10/17/2014  . General weakness 07/14/2014  . Urinary incontinence 07/14/2014  . Headache(784.0) 10/15/2013  . Hand joint pain 06/10/2013  . Rotator cuff tear arthropathy of both shoulders 06/10/2013  . Skin lesion of cheek 05/01/2013  . Pain of right thumb 04/03/2013  . Balance disorder 03/12/2013  . Gait disorder 03/12/2013  . Tremor 03/12/2013  . Left  hip pain 03/12/2013  . Pre-ulcerative corn or callous 02/06/2013  . Anxiety 11/12/2011  . OSA (obstructive sleep apnea) 11/07/2011  . Bradycardia 10/20/2011  . Insomnia 10/04/2011  . Obesity 01/12/2011  . ERECTILE DYSFUNCTION, ORGANIC 05/30/2010  . [place] DISEASE, LUMBAR 04/19/2010  . SCIATICA, LEFT 04/19/2010  . Chronic pain syndrome 10/27/2009  . Hyperlipidemia 07/15/2009  . Essential hypertension 06/24/2009  . Coronary artery disease involving native coronary artery of native heart without angina pectoris 06/24/2009  . Allergic rhinitis 06/24/2009  . URETHRAL STRICTURE 06/24/2009  . DEGENERATIVE JOINT DISEASE 06/24/2009  . SHOULDER PAIN, BILATERAL 06/24/2009  . FATIGUE 06/24/2009  . NEPHROLITHIASIS, HX OF 06/24/2009   Past Medical History:  Diagnosis Date  . ALLERGIC RHINITIS 06/24/2009  . Anxiety 11/12/2011   Adequate for discharge   . Arthritis    "all my joints" (09/30/2013)  . Arthritis of foot, right, degenerative 04/15/2014  . Balance disorder 03/12/2013  . Benign neoplasm of cecum   . Benign neoplasm of descending colon   . CAD (coronary artery disease) 06/24/2009   5 stents placed in 2007    . Chronic anticoagulation   . Chronic pain syndrome 10/27/2009   of ankle, shoulders, low back.  sciatica.   . Closed fracture of right foot 10/17/2014  . CORONARY ARTERY DISEASE 06/24/2009   a. s/p multiple PCIs - In 2008 he had a Taxus DES to the mild LAD, Endeavor DES to mid LCX and distal LCX. In January 2009 he had DES to distal LCX, mid LCX and proximal LCX. In November 2009 had BMS x 2 to the mid RCA. Cath 10/2011 with patent stents, noncardiac CP. LHC 01/2013: patent stents (noncardiac CP).  . DEGENERATIVE JOINT DISEASE 06/24/2009   Qualifier: Diagnosis of  By: [name], [name]   . Depression   . Depression with anxiety    Prior suicide attempt  . [place] DISEASE, LUMBAR 04/19/2010  . ERECTILE DYSFUNCTION, ORGANIC 05/30/2010  . Essential hypertension 06/24/2009   Qualifier: Diagnosis of   By: [name], [name]   . Fibromyalgia   . Fracture dislocation of ankle joint 09/02/2015  . Gait disorder 03/12/2013  . General weakness 07/14/2014  . GERD (gastroesophageal reflux disease) 09/08/2015  . Hand joint pain 06/10/2013  . Heart murmur   . Hepatitis C   . History of kidney stones   . Hyperlipidemia 07/15/2009   Qualifier: Diagnosis of  By: Aundra Dubin, MD, [place]    . HYPERLIPIDEMIA-MIXED 07/15/2009  . HYPERTENSION 06/24/2009  . Insomnia 10/04/2011  . Irregular heart beat   . Left hip pain 03/12/2013   Injected under ultrasound guidance on June 24, 2013   . Major depression 09/13/2015  . Myocardial infarction ([place]) 2006-08-05  . Non-cardiac chest pain 10/2011, 01/2013  . Obesity   . Occult blood positive stool 10/17/2014  . Open ankle fracture 09/02/2015  . OSA (obstructive sleep apnea)    not using CPAP (09/30/2013)  . Pain of right thumb 04/03/2013  . Pneumonia   . PPD positive 04/08/2015  . Pre-ulcerative corn or callous 02/06/2013  . Rotator cuff tear arthropathy of both shoulders 06/10/2013   History of bilateral shoulder cuff surgery for rotator cuff tears. Reports increase in pain 09/11/2015 during physical therapy of the left shoulder.   [name] SCIATICA, LEFT 04/19/2010   Qualifier: Diagnosis of  By: [name], [name]   . Spinal stenosis in cervical region 09/26/2013  . Spinal stenosis, lumbar region, with neurogenic claudication 09/26/2013  . Type II diabetes mellitus ([place]) 2010/08/05   no meds in 09/2014.   [name] Uncontrolled type 2 DM with peripheral circulatory disorder ([place]) 10/04/2013  . URETHRAL STRICTURE 06/24/2009   self catheterizes.     Family History  Problem Relation Age of Onset  . Depression Mother   . Heart disease Mother   . Hypertension Mother   . Breast cancer Mother   . Diabetes Father   . Heart disease Father        CABG  . Hypertension Father   . Hyperlipidemia Father   . Prostate cancer Father   . Skin cancer Father   . Depression Brother        x 2  . Hypertension  Brother        x2  . Healthy Son   . Heart disease Maternal Grandfather   . Early death Maternal Grandfather   . Heart attack Maternal Grandfather 2063/08/06  . Early death Paternal Grandfather   . Coronary artery disease Other   . Hypertension Other   . Depression Other   . Healthy Son     Past Surgical History:  Procedure Laterality Date  . AMPUTATION Right 06/14/2016   Procedure: AMPUTATION BELOW KNEE;  Surgeon: Newt Minion, MD;  Location: [place];  Service: Orthopedics;  Laterality: Right;  . ANKLE FUSION Right 04/15/2014   Procedure: Right Subtalar, Talonavicular Fusion;  Surgeon: Newt Minion, MD;  Location: [place];  Service: Orthopedics;  Laterality: Right;  . ANKLE FUSION Right 04/18/2016   Procedure: Right Ankle Tibiocalcaneal Fusion;  Surgeon: Newt Minion, MD;  Location: [place];  Service: Orthopedics;  Laterality: Right;  . ANTERIOR CERVICAL DECOMP/DISCECTOMY FUSION N/A 09/26/2013   Procedure: ANTERIOR CERVICAL DISCECTOMY FUSION C3-4, plate and screw fixation, allograft bone graft;  Surgeon: Jessy Oto, MD;  Location: [place];  Service: Orthopedics;  Laterality: N/A;  . BACK SURGERY    . BELOW KNEE LEG AMPUTATION Right 06/14/2016  . CARDIAC CATHETERIZATION  X 1  . CARPAL TUNNEL RELEASE Bilateral   . COLONOSCOPY    . COLONOSCOPY N/A 10/22/2014   Procedure: COLONOSCOPY;  Surgeon: Lafayette Dragon, MD;  [facility] ENDOSCOPY;  Service: Endoscopy;  Laterality: N/A;  . CORONARY ANGIOPLASTY WITH STENT PLACEMENT     "I have 9 stents"  . ESOPHAGOGASTRODUODENOSCOPY N/A 10/19/2014   Procedure: ESOPHAGOGASTRODUODENOSCOPY (EGD);  Surgeon: Jerene Bears, MD;  [facility];  Service: Endoscopy;  Laterality: N/A;  . FRACTURE SURGERY    . FUSION OF TALONAVICULAR JOINT Right 04/15/2014   dr duda  . HERNIA REPAIR     umbilical  . INGUINAL HERNIA REPAIR  Right 05/11/2015   Procedure: LAPAROSCOPIC REPAIR RIGHT  INGUINAL HERNIA;  Surgeon: Greer Pickerel, MD;  Location: [place];  Service: General;   Laterality: Right;  . INSERTION OF MESH Right 05/11/2015   Procedure: INSERTION OF MESH;  Surgeon: Greer Pickerel, MD;  Location: [place];  Service: General;  Laterality: Right;  . JOINT REPLACEMENT    . KNEE CARTILAGE SURGERY Right X 12   "~ 1/2 open; ~ 1/2 scopes"  . KNEE CARTILAGE SURGERY Left X 3   "3 scopes"  . LEFT HEART CATHETERIZATION WITH CORONARY ANGIOGRAM N/A 02/10/2013   Procedure: LEFT HEART CATHETERIZATION WITH CORONARY ANGIOGRAM;  Surgeon: Burnell Blanks, MD;  Location: [facility] CATH LAB;  Service: Cardiovascular;  Laterality: N/A;  . LUMBAR LAMINECTOMY/DECOMPRESSION MICRODISCECTOMY N/A 01/27/2014   Procedure: CENTRAL LUMBAR LAMINECTOMY L4-5 AND L3-4;  Surgeon: Jessy Oto, MD;  Location: [place];  Service: Orthopedics;  Laterality: N/A;  . ORIF ANKLE FRACTURE Right 09/02/2015   Procedure: OPEN REDUCTION INTERNAL FIXATION (ORIF) ANKLE FRACTURE;  Surgeon: Newt Minion, MD;  Location: [place];  Service: Orthopedics;  Laterality: Right;  . PERIPHERALLY INSERTED CENTRAL CATHETER INSERTION  09/02/2015  . SHOULDER ARTHROSCOPY W/ ROTATOR CUFF REPAIR Bilateral    "3 on the right; 1 on the left"  . SKIN SPLIT GRAFT Right 10/01/2015   Procedure: RIGHT ANKLE APPLY SKIN GRAFT SPLIT THICKNESS;  Surgeon: Newt Minion, MD;  Location: [place];  Service: Orthopedics;  Laterality: Right;  . TONSILLECTOMY    . TOOTH EXTRACTION    . TOTAL HIP ARTHROPLASTY Right 04/17/2018  . TOTAL HIP ARTHROPLASTY Right 04/17/2018   Procedure: RIGHT TOTAL HIP ARTHROPLASTY;  Surgeon: Newt Minion, MD;  Location: [place];  Service: Orthopedics;  Laterality: Right;  . TOTAL KNEE ARTHROPLASTY Bilateral 2008  . UMBILICAL HERNIA REPAIR     UHR  . URETHRAL DILATION  X 4  . VASECTOMY    . WISDOM TOOTH EXTRACTION     Social History   Occupational History  . Occupation: disabled since 2006 due to ortho. heart, psych    Employer: UNEMPLOYED  . Occupation: part time work as an Multimedia programmer, wrestling, and Cabin crew  Tobacco Use  . Smoking status: Former Smoker    Types: Cigars    Last attempt to quit: 08/28/2010    Years since quitting: 7.7  . Smokeless tobacco: Never Used  . Tobacco comment: 04/18/2016 "smoked 1 cigar/wk when I did smoke"  Substance and Sexual Activity  . Alcohol use: No    Alcohol/week: 0.0 standard drinks    Comment: rarely  . Drug use: No  . Sexual activity: Not Currently

## 2018-06-04 NOTE — Patient Instructions (Signed)
Avoid bending, stooping and avoid lifting weights greater than 10 lbs. Avoid prolong standing and walking. Order for a new walker with wheels. Surgery scheduling secretary Kandice Hams, will call you in the next week to schedule for surgery.  Surgery recommended is a one level left lumbar L4-5 redo partial hemilaminectomy with foramenotomy of left L4 this would be done with microscope.  Take hydrocodone for for pain. Risk of surgery includes risk of infection 1 in 200 patients, bleeding 1/2% chance you would need a transfusion.   Risk to the nerves is one in 10,000.  Expect improved walking and standing tolerance. Expect relief of leg pain but numbness may persist depending on the length and degree of pressure that has been present.

## 2018-06-06 DIAGNOSIS — M48062 Spinal stenosis, lumbar region with neurogenic claudication: Secondary | ICD-10-CM | POA: Diagnosis not present

## 2018-06-06 DIAGNOSIS — T8789 Other complications of amputation stump: Secondary | ICD-10-CM | POA: Diagnosis not present

## 2018-06-06 DIAGNOSIS — M150 Primary generalized (osteo)arthritis: Secondary | ICD-10-CM | POA: Diagnosis not present

## 2018-06-06 DIAGNOSIS — M4802 Spinal stenosis, cervical region: Secondary | ICD-10-CM | POA: Diagnosis not present

## 2018-06-06 DIAGNOSIS — I110 Hypertensive heart disease with heart failure: Secondary | ICD-10-CM | POA: Diagnosis not present

## 2018-06-06 DIAGNOSIS — G894 Chronic pain syndrome: Secondary | ICD-10-CM | POA: Diagnosis not present

## 2018-06-06 DIAGNOSIS — M797 Fibromyalgia: Secondary | ICD-10-CM | POA: Diagnosis not present

## 2018-06-06 DIAGNOSIS — M5432 Sciatica, left side: Secondary | ICD-10-CM | POA: Diagnosis not present

## 2018-06-06 DIAGNOSIS — M5136 Other intervertebral disc degeneration, lumbar region: Secondary | ICD-10-CM | POA: Diagnosis not present

## 2018-06-07 DIAGNOSIS — M150 Primary generalized (osteo)arthritis: Secondary | ICD-10-CM | POA: Diagnosis not present

## 2018-06-07 DIAGNOSIS — I110 Hypertensive heart disease with heart failure: Secondary | ICD-10-CM | POA: Diagnosis not present

## 2018-06-07 DIAGNOSIS — G894 Chronic pain syndrome: Secondary | ICD-10-CM | POA: Diagnosis not present

## 2018-06-07 DIAGNOSIS — M5432 Sciatica, left side: Secondary | ICD-10-CM | POA: Diagnosis not present

## 2018-06-07 DIAGNOSIS — M5136 Other intervertebral disc degeneration, lumbar region: Secondary | ICD-10-CM | POA: Diagnosis not present

## 2018-06-07 DIAGNOSIS — M48062 Spinal stenosis, lumbar region with neurogenic claudication: Secondary | ICD-10-CM | POA: Diagnosis not present

## 2018-06-07 DIAGNOSIS — M797 Fibromyalgia: Secondary | ICD-10-CM | POA: Diagnosis not present

## 2018-06-07 DIAGNOSIS — T8789 Other complications of amputation stump: Secondary | ICD-10-CM | POA: Diagnosis not present

## 2018-06-07 DIAGNOSIS — M4802 Spinal stenosis, cervical region: Secondary | ICD-10-CM | POA: Diagnosis not present

## 2018-06-10 DIAGNOSIS — E11610 Type 2 diabetes mellitus with diabetic neuropathic arthropathy: Secondary | ICD-10-CM | POA: Diagnosis not present

## 2018-06-10 DIAGNOSIS — I2510 Atherosclerotic heart disease of native coronary artery without angina pectoris: Secondary | ICD-10-CM | POA: Diagnosis not present

## 2018-06-10 DIAGNOSIS — R2689 Other abnormalities of gait and mobility: Secondary | ICD-10-CM | POA: Diagnosis not present

## 2018-06-10 DIAGNOSIS — Z4789 Encounter for other orthopedic aftercare: Secondary | ICD-10-CM | POA: Diagnosis not present

## 2018-06-11 DIAGNOSIS — M48062 Spinal stenosis, lumbar region with neurogenic claudication: Secondary | ICD-10-CM | POA: Diagnosis not present

## 2018-06-11 DIAGNOSIS — T8789 Other complications of amputation stump: Secondary | ICD-10-CM | POA: Diagnosis not present

## 2018-06-11 DIAGNOSIS — M5136 Other intervertebral disc degeneration, lumbar region: Secondary | ICD-10-CM | POA: Diagnosis not present

## 2018-06-11 DIAGNOSIS — G894 Chronic pain syndrome: Secondary | ICD-10-CM | POA: Diagnosis not present

## 2018-06-11 DIAGNOSIS — M150 Primary generalized (osteo)arthritis: Secondary | ICD-10-CM | POA: Diagnosis not present

## 2018-06-11 DIAGNOSIS — I110 Hypertensive heart disease with heart failure: Secondary | ICD-10-CM | POA: Diagnosis not present

## 2018-06-11 DIAGNOSIS — M797 Fibromyalgia: Secondary | ICD-10-CM | POA: Diagnosis not present

## 2018-06-11 DIAGNOSIS — M4802 Spinal stenosis, cervical region: Secondary | ICD-10-CM | POA: Diagnosis not present

## 2018-06-11 DIAGNOSIS — M5432 Sciatica, left side: Secondary | ICD-10-CM | POA: Diagnosis not present

## 2018-06-12 DIAGNOSIS — M961 Postlaminectomy syndrome, not elsewhere classified: Secondary | ICD-10-CM | POA: Diagnosis not present

## 2018-06-12 DIAGNOSIS — T8789 Other complications of amputation stump: Secondary | ICD-10-CM | POA: Diagnosis not present

## 2018-06-12 DIAGNOSIS — M1611 Unilateral primary osteoarthritis, right hip: Secondary | ICD-10-CM | POA: Diagnosis not present

## 2018-06-12 DIAGNOSIS — R6889 Other general symptoms and signs: Secondary | ICD-10-CM | POA: Diagnosis not present

## 2018-06-12 DIAGNOSIS — M79609 Pain in unspecified limb: Secondary | ICD-10-CM | POA: Diagnosis not present

## 2018-06-13 ENCOUNTER — Other Ambulatory Visit (HOSPITAL_COMMUNITY): Payer: Self-pay | Admitting: Psychiatry

## 2018-06-13 ENCOUNTER — Encounter: Payer: Self-pay | Admitting: Skilled Nursing Facility1

## 2018-06-13 ENCOUNTER — Encounter: Payer: Medicare HMO | Attending: General Surgery | Admitting: Skilled Nursing Facility1

## 2018-06-13 DIAGNOSIS — Z955 Presence of coronary angioplasty implant and graft: Secondary | ICD-10-CM | POA: Diagnosis not present

## 2018-06-13 DIAGNOSIS — Z6841 Body Mass Index (BMI) 40.0 and over, adult: Secondary | ICD-10-CM | POA: Insufficient documentation

## 2018-06-13 DIAGNOSIS — Z79899 Other long term (current) drug therapy: Secondary | ICD-10-CM | POA: Insufficient documentation

## 2018-06-13 DIAGNOSIS — M160 Bilateral primary osteoarthritis of hip: Secondary | ICD-10-CM | POA: Insufficient documentation

## 2018-06-13 DIAGNOSIS — R6889 Other general symptoms and signs: Secondary | ICD-10-CM | POA: Diagnosis not present

## 2018-06-13 DIAGNOSIS — G4733 Obstructive sleep apnea (adult) (pediatric): Secondary | ICD-10-CM | POA: Insufficient documentation

## 2018-06-13 DIAGNOSIS — Z713 Dietary counseling and surveillance: Secondary | ICD-10-CM | POA: Insufficient documentation

## 2018-06-13 DIAGNOSIS — E119 Type 2 diabetes mellitus without complications: Secondary | ICD-10-CM | POA: Insufficient documentation

## 2018-06-13 DIAGNOSIS — E669 Obesity, unspecified: Secondary | ICD-10-CM

## 2018-06-13 DIAGNOSIS — Z7902 Long term (current) use of antithrombotics/antiplatelets: Secondary | ICD-10-CM | POA: Insufficient documentation

## 2018-06-13 DIAGNOSIS — Z89511 Acquired absence of right leg below knee: Secondary | ICD-10-CM | POA: Diagnosis not present

## 2018-06-13 DIAGNOSIS — I10 Essential (primary) hypertension: Secondary | ICD-10-CM | POA: Diagnosis not present

## 2018-06-13 DIAGNOSIS — G894 Chronic pain syndrome: Secondary | ICD-10-CM | POA: Insufficient documentation

## 2018-06-13 DIAGNOSIS — F319 Bipolar disorder, unspecified: Secondary | ICD-10-CM | POA: Insufficient documentation

## 2018-06-13 DIAGNOSIS — I2510 Atherosclerotic heart disease of native coronary artery without angina pectoris: Secondary | ICD-10-CM | POA: Diagnosis not present

## 2018-06-13 NOTE — Progress Notes (Signed)
Sleeve Assessment: 5th  SWL Appointment.   According to pts referral the pt needs 6 SWL with 2 assessments. Pt state he works with a professional for depression talk therapy and medication management.  Pt does have right below the knee amputation.  Pt states he has a goal of 2000 calories. Pt states working with his phycologist has been helpful. Pt states he has been working on hunger verses appetite.Pt does get meals on wheels.  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 Pt states to control his emotional eating he walk to the steps or bedside exercises.  Pt states his hip is feeling really good and states now he will be getting back surgery. Pt states his A1C is 5.2. Pt states all of his meals have been balanced and good. Pt states his PT comes ot the house 2 times a week. Pt states he has a new girlfriend online.   Start weight at [facility]: 317.8 (this weight is with his prosthesis) Weight: 326 (with prosthesis) BMI: 44.21  MEDICATIONS: see list   DIETARY INTAKE:  24 hr Dietary Recall: 3 meals a day eating every 3-5 hours First Meal: 2 bacon and 1 eggs and 2 pieces of toast  Snack: milk and kellog bar Second Meal: meals on wheels  Third Meal: 5 dollar pizza from [employer] or hamburger or hotdog or chicken with man n cheese and asparagus  Snack 9pm: popcorn Beverages: 4 liters a day of water, 1% milk  Usual physical activity: ADL's due to pain  Diet to Follow: 2000 calories 225 g carbohydrates 150 g protein 56 g fat   Nutritional Diagnosis:  [place]-3.3 Overweight/obesity related to past poor dietary habits and physical inactivity as evidenced by patient w/ planned Sleeve surgery following dietary guidelines for continued weight loss.    Intervention:  Nutrition counseling for upcoming Bariatric Surgery. Goals: -Encouraged to engage in 150 minutes of moderate physical activity including cardiovascular and weight baring weekly -Continue to make those great changes! -Work on keeping fat in  the single digits -Focus on healing after your hip surgery with at least 4-5 meals/snacks a day and 80-100 fluid ounces  -Keep up with your PT daily   Teaching Method Utilized:  Visual Auditory Hands on  Handouts given during visit include:  Pre-op goals  Barriers to learning/adherence to lifestyle change: emotional eating  Demonstrated degree of understanding via:  Teach Back   Monitoring/Evaluation:  Dietary intake, exercise,and body weight prn.

## 2018-06-14 ENCOUNTER — Telehealth (INDEPENDENT_AMBULATORY_CARE_PROVIDER_SITE_OTHER): Payer: Self-pay | Admitting: Orthopedic Surgery

## 2018-06-14 ENCOUNTER — Telehealth (INDEPENDENT_AMBULATORY_CARE_PROVIDER_SITE_OTHER): Payer: Self-pay

## 2018-06-14 NOTE — Telephone Encounter (Signed)
The fax was received yesterday this was signed and will fax back.

## 2018-06-14 NOTE — Telephone Encounter (Signed)
Sharyn Lull from [facility] called to fu on a fax that was sent for Dr. Sharol Given to sign.  CB#[phone]

## 2018-06-14 NOTE — Telephone Encounter (Signed)
Patient left message inquiring about scheduling surgery.  Do you have surgery sheet ready?

## 2018-06-18 DIAGNOSIS — M5136 Other intervertebral disc degeneration, lumbar region: Secondary | ICD-10-CM | POA: Diagnosis not present

## 2018-06-18 DIAGNOSIS — M5432 Sciatica, left side: Secondary | ICD-10-CM | POA: Diagnosis not present

## 2018-06-18 DIAGNOSIS — I110 Hypertensive heart disease with heart failure: Secondary | ICD-10-CM | POA: Diagnosis not present

## 2018-06-18 DIAGNOSIS — M48062 Spinal stenosis, lumbar region with neurogenic claudication: Secondary | ICD-10-CM | POA: Diagnosis not present

## 2018-06-18 DIAGNOSIS — T8789 Other complications of amputation stump: Secondary | ICD-10-CM | POA: Diagnosis not present

## 2018-06-18 DIAGNOSIS — M797 Fibromyalgia: Secondary | ICD-10-CM | POA: Diagnosis not present

## 2018-06-18 DIAGNOSIS — M4802 Spinal stenosis, cervical region: Secondary | ICD-10-CM | POA: Diagnosis not present

## 2018-06-18 DIAGNOSIS — M150 Primary generalized (osteo)arthritis: Secondary | ICD-10-CM | POA: Diagnosis not present

## 2018-06-18 DIAGNOSIS — G894 Chronic pain syndrome: Secondary | ICD-10-CM | POA: Diagnosis not present

## 2018-06-20 ENCOUNTER — Ambulatory Visit (INDEPENDENT_AMBULATORY_CARE_PROVIDER_SITE_OTHER): Payer: Medicare HMO | Admitting: Physician Assistant

## 2018-06-20 ENCOUNTER — Encounter (INDEPENDENT_AMBULATORY_CARE_PROVIDER_SITE_OTHER): Payer: Self-pay | Admitting: Physician Assistant

## 2018-06-20 VITALS — Ht 72.0 in | Wt 326.0 lb

## 2018-06-20 DIAGNOSIS — Z89511 Acquired absence of right leg below knee: Secondary | ICD-10-CM | POA: Diagnosis not present

## 2018-06-20 DIAGNOSIS — Z96641 Presence of right artificial hip joint: Secondary | ICD-10-CM

## 2018-06-20 DIAGNOSIS — Z6841 Body Mass Index (BMI) 40.0 and over, adult: Secondary | ICD-10-CM

## 2018-06-20 DIAGNOSIS — R6889 Other general symptoms and signs: Secondary | ICD-10-CM | POA: Diagnosis not present

## 2018-06-21 ENCOUNTER — Encounter (INDEPENDENT_AMBULATORY_CARE_PROVIDER_SITE_OTHER): Payer: Self-pay | Admitting: Physician Assistant

## 2018-06-21 DIAGNOSIS — I110 Hypertensive heart disease with heart failure: Secondary | ICD-10-CM | POA: Diagnosis not present

## 2018-06-21 DIAGNOSIS — M5432 Sciatica, left side: Secondary | ICD-10-CM | POA: Diagnosis not present

## 2018-06-21 DIAGNOSIS — M4802 Spinal stenosis, cervical region: Secondary | ICD-10-CM | POA: Diagnosis not present

## 2018-06-21 DIAGNOSIS — M797 Fibromyalgia: Secondary | ICD-10-CM | POA: Diagnosis not present

## 2018-06-21 DIAGNOSIS — T8789 Other complications of amputation stump: Secondary | ICD-10-CM | POA: Diagnosis not present

## 2018-06-21 DIAGNOSIS — M150 Primary generalized (osteo)arthritis: Secondary | ICD-10-CM | POA: Diagnosis not present

## 2018-06-21 DIAGNOSIS — M48062 Spinal stenosis, lumbar region with neurogenic claudication: Secondary | ICD-10-CM | POA: Diagnosis not present

## 2018-06-21 DIAGNOSIS — M5136 Other intervertebral disc degeneration, lumbar region: Secondary | ICD-10-CM | POA: Diagnosis not present

## 2018-06-21 DIAGNOSIS — G894 Chronic pain syndrome: Secondary | ICD-10-CM | POA: Diagnosis not present

## 2018-06-21 NOTE — Progress Notes (Signed)
Office Visit Note   Patient: Travis [name] Sr.           Date of Birth: Mar 30, 1951           MRN: [id number] Visit Date: 06/20/2018              Requested by: Travis Borg, MD Travis [name], Travis [name] [id number] PCP: Travis Borg, MD  Chief Complaint  Patient presents with  . Right Hip - Routine Post Op    04/17/18 right total hip      HPI: The patient is Travis 68 yo gentleman who is seen for post operative follow up following right total hip replacement 04/17/2018. He has had no further drainage or problems with the incision and reports he is progressing well with PT. He is WBAT, but has still been under hip precautions. He is in pain management.   Assessment & Plan: Visit Diagnoses:  1. History of total hip arthroplasty, right   2. History of right below knee amputation ([place])   3. Body mass index 40.0-44.9, adult (HCC)     Plan: Hip precautions have been discontinued and orders sent to [facility] for 2 x/wk x 4 weeks for progression with gait and strengthening and range of motion. Will plan to follow up with the patient annually for his total hip replacement.   Follow-Up Instructions: Return in about 1 year (around 06/21/2019).   Ortho Exam  Patient is alert, oriented, no adenopathy, well-dressed, normal affect, normal respiratory effort. Right hip incision is well healed with faint scar. Non tender over right hip to palpation. Ambulates with walker. Hip range of motion is pain free. No signs of infection or cellulitis.   Imaging: No results found. No images are attached to the encounter.  Labs: Lab Results  Component Value Date   HGBA1C 5.2 06/03/2018   HGBA1C 5.5 01/10/2018   HGBA1C 5.4 07/13/2017   ESRSEDRATE 20 (H) 09/03/2013   ESRSEDRATE 11 03/25/2013   CRP 10.7 (H) 09/03/2013   REPTSTATUS 01/30/2014 FINAL 01/29/2014   GRAMSTAIN  09/02/2013    WBC PRESENT, PREDOMINANTLY MONONUCLEAR NO ORGANISMS SEEN CYTOSPIN Performed by [facility] Performed at [facility]  09/02/2013    NO ORGANISMS SEEN WBC PRESENT, PREDOMINANTLY MONONUCLEAR CYTOSPIN Gram Stain Report Called to,Read Back By and Verified With: Travis [name] 09/02/13 Travis Travis [name]   CULT NO GROWTH Performed at Travis [name] 01/29/2014   LABORGA STAPHYLOCOCCUS SPECIES (COAGULASE NEGATIVE) 04/08/2015     Lab Results  Component Value Date   ALBUMIN 4.0 06/03/2018   ALBUMIN 3.9 04/11/2018   ALBUMIN 4.6 01/10/2018    Body mass index is 44.21 kg/m.  Orders:  No orders of the defined types were placed in this encounter.  No orders of the defined types were placed in this encounter.    Procedures: No procedures performed  Clinical Data: No additional findings.  ROS:  All other systems negative, except as noted in the HPI. Review of Systems  Objective: Vital Signs: Ht 6' (1.829 m)   Wt (!) 326 lb (147.9 kg)   BMI 44.21 kg/m   Specialty Comments:  No specialty comments available.  PMFS History: Patient Active Problem List   Diagnosis Date Noted  . Status post THR (total hip replacement) 04/17/2018  . Unilateral primary osteoarthritis, right hip   . Morbid obesity with BMI of 40.0-44.9, adult ([name]) 02/28/2018  . Myocardial infarction ([name]) 02/25/2018  .  Coronary artery disease involving native coronary artery of native heart 02/25/2018  . PAD (peripheral artery disease) ([place]) 02/25/2018  . S/P BKA (below knee amputation) unilateral, right ([place]) 02/25/2018  . Sacroiliitis ([place]) 02/25/2018  . SOB (shortness of breath) 01/03/2018  . Acute on chronic heart failure ([place]) 01/03/2018  . Severe right groin pain 12/20/2017  . Morbid obesity (Travis [name]) 10/09/2017  . Exposure to sexually transmitted disease (STD) 07/14/2017  . Low back pain 07/13/2017  . Leukoplakia, tongue 01/26/2017  . Acute sinus infection 10/20/2016  . Hyperglycemia 10/20/2016  . Skin lesion 10/20/2016  . S/P unilateral BKA (below knee  amputation), right (Travis [name]) 06/14/2016  . Charcot foot due to diabetes mellitus (Travis [name])   . Charcot's arthropathy associated with type 2 diabetes mellitus (Travis [name]) 04/11/2016  . Preventative health care 11/05/2015  . Major depression 09/13/2015  . S/P TKR (total knee replacement) bilaterally 09/13/2015  . GERD (gastroesophageal reflux disease) 09/08/2015  . S/P laparoscopic hernia repair 05/11/2015  . PPD positive 04/08/2015  . Benign neoplasm of descending colon   . Benign neoplasm of cecum   . AKI (acute kidney injury) (Travis [name]) 10/18/2014  . Acute blood loss anemia   . Chronic anticoagulation   . Occult blood positive stool 10/17/2014  . General weakness 07/14/2014  . Urinary incontinence 07/14/2014  . Headache(784.0) 10/15/2013  . Spinal stenosis in cervical region 09/26/2013    Class: Chronic  . Spinal stenosis, lumbar region, with neurogenic claudication 09/26/2013    Class: Chronic  . Hand joint pain 06/10/2013  . Rotator cuff tear arthropathy of both shoulders 06/10/2013  . Skin lesion of cheek 05/01/2013  . Pain of right thumb 04/03/2013  . Balance disorder 03/12/2013  . Gait disorder 03/12/2013  . Tremor 03/12/2013  . Left hip pain 03/12/2013  . Pre-ulcerative corn or callous 02/06/2013  . Anxiety 11/12/2011  . OSA (obstructive sleep apnea) 11/07/2011  . Bradycardia 10/20/2011  . Insomnia 10/04/2011  . Obesity 01/12/2011  . ERECTILE DYSFUNCTION, ORGANIC 05/30/2010  . [place] DISEASE, LUMBAR 04/19/2010  . SCIATICA, LEFT 04/19/2010  . Chronic pain syndrome 10/27/2009  . Hyperlipidemia 07/15/2009  . Essential hypertension 06/24/2009  . Coronary artery disease involving native coronary artery of native heart without angina pectoris 06/24/2009  . Allergic rhinitis 06/24/2009  . URETHRAL STRICTURE 06/24/2009  . DEGENERATIVE JOINT DISEASE 06/24/2009  . SHOULDER PAIN, BILATERAL 06/24/2009  . FATIGUE 06/24/2009  . NEPHROLITHIASIS, HX OF 06/24/2009   Past Medical History:    Diagnosis Date  . ALLERGIC RHINITIS 06/24/2009  . Anxiety 11/12/2011   Adequate for discharge   . Arthritis    "all my joints" (09/30/2013)  . Arthritis of foot, right, degenerative 04/15/2014  . Balance disorder 03/12/2013  . Benign neoplasm of cecum   . Benign neoplasm of descending colon   . CAD (coronary artery disease) 06/24/2009   5 stents placed in 2007    . Chronic anticoagulation   . Chronic pain syndrome 10/27/2009   of ankle, shoulders, low back.  sciatica.   . Closed fracture of right foot 10/17/2014  . CORONARY ARTERY DISEASE 06/24/2009   Travis. s/p multiple PCIs - In 2008 he had Travis Taxus DES to the mild LAD, Endeavor DES to mid LCX and distal LCX. In January 2009 he had DES to distal LCX, mid LCX and proximal LCX. In November 2009 had BMS x 2 to the mid RCA. Cath 10/2011 with patent stents, noncardiac CP. LHC 01/2013: patent stents (noncardiac CP).  . DEGENERATIVE JOINT DISEASE 06/24/2009  Qualifier: Diagnosis of  By: [name], [name]   . Depression   . Depression with anxiety    Prior suicide attempt  . [place] DISEASE, LUMBAR 04/19/2010  . ERECTILE DYSFUNCTION, ORGANIC 05/30/2010  . Essential hypertension 06/24/2009   Qualifier: Diagnosis of  By: [name], [name]   . Fibromyalgia   . Fracture dislocation of ankle joint 09/02/2015  . Gait disorder 03/12/2013  . General weakness 07/14/2014  . GERD (gastroesophageal reflux disease) 09/08/2015  . Hand joint pain 06/10/2013  . Heart murmur   . Hepatitis C   . History of kidney stones   . Hyperlipidemia 07/15/2009   Qualifier: Diagnosis of  By: Aundra Dubin, MD, [place]    . HYPERLIPIDEMIA-MIXED 07/15/2009  . HYPERTENSION 06/24/2009  . Insomnia 10/04/2011  . Irregular heart beat   . Left hip pain 03/12/2013   Injected under ultrasound guidance on June 24, 2013   . Major depression 09/13/2015  . Myocardial infarction ([place]) 08/05/2006  . Non-cardiac chest pain 10/2011, 01/2013  . Obesity   . Occult blood positive stool 10/17/2014  . Open ankle fracture  09/02/2015  . OSA (obstructive sleep apnea)    not using CPAP (09/30/2013)  . Pain of right thumb 04/03/2013  . Pneumonia   . PPD positive 04/08/2015  . Pre-ulcerative corn or callous 02/06/2013  . Rotator cuff tear arthropathy of both shoulders 06/10/2013   History of bilateral shoulder cuff surgery for rotator cuff tears. Reports increase in pain 09/11/2015 during physical therapy of the left shoulder.   [name] SCIATICA, LEFT 04/19/2010   Qualifier: Diagnosis of  By: [name], [name]   . Spinal stenosis in cervical region 09/26/2013  . Spinal stenosis, lumbar region, with neurogenic claudication 09/26/2013  . Type II diabetes mellitus ([place]) 08-05-2010   no meds in 09/2014.   [name] Uncontrolled type 2 DM with peripheral circulatory disorder ([place]) 10/04/2013  . URETHRAL STRICTURE 06/24/2009   self catheterizes.     Family History  Problem Relation Age of Onset  . Depression Mother   . Heart disease Mother   . Hypertension Mother   . Breast cancer Mother   . Diabetes Father   . Heart disease Father        CABG  . Hypertension Father   . Hyperlipidemia Father   . Prostate cancer Father   . Skin cancer Father   . Depression Brother        x 2  . Hypertension Brother        x2  . Healthy Son   . Heart disease Maternal Grandfather   . Early death Maternal Grandfather   . Heart attack Maternal Grandfather 08/06/2063  . Early death Paternal Grandfather   . Coronary artery disease Other   . Hypertension Other   . Depression Other   . Healthy Son     Past Surgical History:  Procedure Laterality Date  . AMPUTATION Right 06/14/2016   Procedure: AMPUTATION BELOW KNEE;  Surgeon: Newt Minion, MD;  Location: [facility];  Service: Orthopedics;  Laterality: Right;  . ANKLE FUSION Right 04/15/2014   Procedure: Right Subtalar, Talonavicular Fusion;  Surgeon: Newt Minion, MD;  Location: [facility];  Service: Orthopedics;  Laterality: Right;  . ANKLE FUSION Right 04/18/2016   Procedure: Right Ankle Tibiocalcaneal Fusion;   Surgeon: Newt Minion, MD;  Location: [facility];  Service: Orthopedics;  Laterality: Right;  . ANTERIOR CERVICAL DECOMP/DISCECTOMY FUSION N/Travis 09/26/2013   Procedure: ANTERIOR CERVICAL DISCECTOMY FUSION  C3-4, plate and screw fixation, allograft bone graft;  Surgeon: Jessy Oto, MD;  Location: [facility];  Service: Orthopedics;  Laterality: N/Travis;  . BACK SURGERY    . BELOW KNEE LEG AMPUTATION Right 06/14/2016  . CARDIAC CATHETERIZATION  X 1  . CARPAL TUNNEL RELEASE Bilateral   . COLONOSCOPY    . COLONOSCOPY N/Travis 10/22/2014   Procedure: COLONOSCOPY;  Surgeon: Lafayette Dragon, MD;  [name];  Service: Endoscopy;  Laterality: N/Travis;  . CORONARY ANGIOPLASTY WITH STENT PLACEMENT     "I have 9 stents"  . ESOPHAGOGASTRODUODENOSCOPY N/Travis 10/19/2014   Procedure: ESOPHAGOGASTRODUODENOSCOPY (EGD);  Surgeon: Jerene Bears, MD;  Location: [facility] ENDOSCOPY;  Service: Endoscopy;  Laterality: N/Travis;  . FRACTURE SURGERY    . FUSION OF TALONAVICULAR JOINT Right 04/15/2014   dr duda  . HERNIA REPAIR     umbilical  . INGUINAL HERNIA REPAIR Right 05/11/2015   Procedure: LAPAROSCOPIC REPAIR RIGHT  INGUINAL HERNIA;  Surgeon: Greer Pickerel, MD;  Location: [facility];  Service: General;  Laterality: Right;  . INSERTION OF MESH Right 05/11/2015   Procedure: INSERTION OF MESH;  Surgeon: Greer Pickerel, MD;  Location: [facility];  Service: General;  Laterality: Right;  . JOINT REPLACEMENT    . KNEE CARTILAGE SURGERY Right X 12   "~ 1/2 open; ~ 1/2 scopes"  . KNEE CARTILAGE SURGERY Left X 3   "3 scopes"  . LEFT HEART CATHETERIZATION WITH CORONARY ANGIOGRAM N/Travis 02/10/2013   Procedure: LEFT HEART CATHETERIZATION WITH CORONARY ANGIOGRAM;  Surgeon: Burnell Blanks, MD;  Location: [facility] CATH LAB;  Service: Cardiovascular;  Laterality: N/Travis;  . LUMBAR LAMINECTOMY/DECOMPRESSION MICRODISCECTOMY N/Travis 01/27/2014   Procedure: CENTRAL LUMBAR LAMINECTOMY L4-5 AND L3-4;  Surgeon: Jessy Oto, MD;  Location: [facility];  Service: Orthopedics;  Laterality:  N/Travis;  . ORIF ANKLE FRACTURE Right 09/02/2015   Procedure: OPEN REDUCTION INTERNAL FIXATION (ORIF) ANKLE FRACTURE;  Surgeon: Newt Minion, MD;  Location: [facility];  Service: Orthopedics;  Laterality: Right;  . PERIPHERALLY INSERTED CENTRAL CATHETER INSERTION  09/02/2015  . SHOULDER ARTHROSCOPY W/ ROTATOR CUFF REPAIR Bilateral    "3 on the right; 1 on the left"  . SKIN SPLIT GRAFT Right 10/01/2015   Procedure: RIGHT ANKLE APPLY SKIN GRAFT SPLIT THICKNESS;  Surgeon: Newt Minion, MD;  Location: [facility];  Service: Orthopedics;  Laterality: Right;  . TONSILLECTOMY    . TOOTH EXTRACTION    . TOTAL HIP ARTHROPLASTY Right 04/17/2018  . TOTAL HIP ARTHROPLASTY Right 04/17/2018   Procedure: RIGHT TOTAL HIP ARTHROPLASTY;  Surgeon: Newt Minion, MD;  Location: [facility];  Service: Orthopedics;  Laterality: Right;  . TOTAL KNEE ARTHROPLASTY Bilateral 2008  . UMBILICAL HERNIA REPAIR     UHR  . URETHRAL DILATION  X 4  . VASECTOMY    . WISDOM TOOTH EXTRACTION     Social History   Occupational History  . Occupation: disabled since 2006 due to ortho. heart, psych    Employer: UNEMPLOYED  . Occupation: part time work as an Multimedia programmer, wrestling, and Holiday representative  Tobacco Use  . Smoking status: Former Smoker    Types: Cigars    Last attempt to quit: 08/28/2010    Years since quitting: 7.8  . Smokeless tobacco: Never Used  . Tobacco comment: 04/18/2016 "smoked 1 cigar/wk when I did smoke"  Substance and Sexual Activity  . Alcohol use: No    Alcohol/week: 0.0 standard drinks    Comment: rarely  .  Drug use: No  . Sexual activity: Not Currently

## 2018-06-24 DIAGNOSIS — R6889 Other general symptoms and signs: Secondary | ICD-10-CM | POA: Diagnosis not present

## 2018-06-25 ENCOUNTER — Telehealth: Payer: Self-pay | Admitting: *Deleted

## 2018-06-25 NOTE — Telephone Encounter (Signed)
   [facility] Pre-operative Risk Assessment    Request for surgical clearance:  1. What type of surgery is being performed? L4-5 HEMILAMINECTOMY  2. When is this surgery scheduled? TBD   3. What type of clearance is required (medical clearance vs. Pharmacy clearance to hold med vs. Both)? MEDICAL  4. Are there any medications that need to be held prior to surgery and how long?NONE LISTED; THOUGH PT IS ON ASA AND EFFIENT; LEFT MESSAGE AT ORTHO TO CONFIRM IF MEDS NEED TO BE HELD   5. Practice name and name of physician performing surgery? [facility]; DR. [name] NITKA   6. What is your office phone number [phone]    7.   What is your office fax number [phone]  8.   Anesthesia type (None, local, MAC, general) ? GENERAL   Julaine Hua 06/25/2018, 10:13 AM  _________________________________________________________________   (provider comments below)

## 2018-06-25 NOTE — Telephone Encounter (Signed)
I s/w Sherrie at Dr. Otho Ket office who does confirm that both Effient and ASA will need to be held. Dr. Louanne Skye is leaving the amount of hold days and when to resume for both meds up to cardiology.

## 2018-06-27 NOTE — Telephone Encounter (Signed)
Dr. Saunders Revel can pt hold both ASA and Effient for L4-5 hemilaminectomy?  For 5 days?  Just send result to pre-op pool.

## 2018-06-28 NOTE — Telephone Encounter (Signed)
As long as no new symptoms have developed since the patient's last visit, I think it is reasonable for him to proceed with back surgery given no evidence of ischemia last year on stress test.  I recommend holding prasugrel 7 days prior to the surgery and restarting when safe to do so from a surgical standpoint.  I do NOT recommend holding aspirin in the perioperative period.  Unless the risk for severe bleeding complications is significantly elevated with continuation of low dose aspirin, I worry that Travis [name] would be at elevated risk for ischemic events given history of multiple PCI's.  Nelva Bush, MD [facility] Pager: [phone]

## 2018-07-01 ENCOUNTER — Encounter: Payer: Self-pay | Admitting: Cardiovascular Disease

## 2018-07-01 ENCOUNTER — Other Ambulatory Visit (HOSPITAL_COMMUNITY): Payer: Self-pay

## 2018-07-01 ENCOUNTER — Ambulatory Visit (INDEPENDENT_AMBULATORY_CARE_PROVIDER_SITE_OTHER): Payer: Medicare HMO | Admitting: Cardiovascular Disease

## 2018-07-01 VITALS — BP 142/88 | HR 76 | Ht 72.0 in | Wt 333.8 lb

## 2018-07-01 DIAGNOSIS — R6889 Other general symptoms and signs: Secondary | ICD-10-CM | POA: Diagnosis not present

## 2018-07-01 DIAGNOSIS — I5032 Chronic diastolic (congestive) heart failure: Secondary | ICD-10-CM

## 2018-07-01 DIAGNOSIS — Z6841 Body Mass Index (BMI) 40.0 and over, adult: Secondary | ICD-10-CM | POA: Diagnosis not present

## 2018-07-01 DIAGNOSIS — Z01810 Encounter for preprocedural cardiovascular examination: Secondary | ICD-10-CM | POA: Diagnosis not present

## 2018-07-01 DIAGNOSIS — I10 Essential (primary) hypertension: Secondary | ICD-10-CM | POA: Diagnosis not present

## 2018-07-01 DIAGNOSIS — I2510 Atherosclerotic heart disease of native coronary artery without angina pectoris: Secondary | ICD-10-CM

## 2018-07-01 DIAGNOSIS — F319 Bipolar disorder, unspecified: Secondary | ICD-10-CM

## 2018-07-01 MED ORDER — TRAZODONE HCL 150 MG PO TABS: 150.0000 mg | ORAL_TABLET | Freq: Every day | ORAL | 0 refills | Status: DC

## 2018-07-01 MED ORDER — DULOXETINE HCL 30 MG PO CPEP: 30.0000 mg | ORAL_CAPSULE | Freq: Two times a day (BID) | ORAL | 0 refills | Status: DC

## 2018-07-01 MED ORDER — ARIPIPRAZOLE 5 MG PO TABS: 5.0000 mg | ORAL_TABLET | Freq: Every day | ORAL | 0 refills | Status: DC

## 2018-07-01 NOTE — Telephone Encounter (Signed)
   Primary Cardiologist: Nelva Bush, MD  Chart reviewed as part of pre-operative protocol coverage. Patient was contacted 07/01/2018 in reference to pre-operative risk assessment for pending surgery as outlined below.  [name] was last seen on 02/28/2018 by Pecolia Ades NP.  Since that day, Travis DRIPPS Sr. has done well without any chest pain or shortness of breath. He is recovering from his right total hip surgery in Nov 2019, and denies any obvious complications.  Therefore, based on ACC/AHA guidelines, the patient would be at acceptable risk for the planned procedure without further cardiovascular testing.   I will route this recommendation to the requesting party via Epic fax function and remove from pre-op pool.  Please call with questions.  Per Dr. Saunders Revel, patient may hold Effient for 7 days prior to surgery, but Dr. Saunders Revel does not recommend holding aspirin during perioperative phase given higher risk of ischemic event. I attempted to call Dr. Otho Ket office today, however was told he is in the hospital. Will include Dr. Louanne Skye in this preop note to see if he can do the surgery on aspirin while holding the Effient.   [place], [place] 07/01/2018, 9:55 AM

## 2018-07-01 NOTE — Patient Instructions (Signed)
Medication Instructions:  Your physician recommends that you continue on your current medications as directed. Please refer to the Current Medication list given to you today.  If you need a refill on your cardiac medications before your next appointment, please call your pharmacy.   Lab work: none If you have labs (blood work) drawn today and your tests are completely normal, you will receive your results only by: [name] MyChart Message (if you have MyChart) OR . A paper copy in the mail If you have any lab test that is abnormal or we need to change your treatment, we will call you to review the results.  Testing/Procedures: none  Follow-Up: At [facility], you and your health needs are our priority.  As part of our continuing mission to provide you with exceptional heart care, we have created designated Provider Care Teams.  These Care Teams include your primary Cardiologist (physician) and Advanced Practice Providers (APPs -  Physician Assistants and Nurse Practitioners) who all work together to provide you with the care you need, when you need it. You will need a follow up appointment in 12 months.  Please call our office 4 months in advance to schedule this appointment.  You may see Lauree Chandler, MD or one of the following Advanced Practice Providers on your designated Care Team:   [place], PA-C Melina Copa, PA-C . Ermalinda Barrios, PA-C  Any Other Special Instructions Will Be Listed Below (If Applicable).

## 2018-07-01 NOTE — Telephone Encounter (Signed)
Copied from [place] [phone]. Topic: Quick Communication - Rx Refill/Question >> Jul 01, 2018  8:47 AM Virl Axe D wrote: Medication: clonazePAM (KLONOPIN) 0.5 MG tablet / Pt stated pharmacy instructed him to call PCP for refill  Has the patient contacted their pharmacy? Yes.   (Agent: If no, request that the patient contact the pharmacy for the refill.) (Agent: If yes, when and what did the pharmacy advise?)  Preferred Pharmacy (with phone number or street name): [place], [place], [place] - [place] [phone] (Phone) [phone] (Fax)  Agent: Please be advised that RX refills may take up to 3 business days. We ask that you follow-up with your pharmacy.

## 2018-07-01 NOTE — Progress Notes (Unsigned)
Patient was last seen in September, he has has a couple of surgeries and missed his appointment in December, I rescheduled patient and sent a 30 day supply to his pharmacy per protocol

## 2018-07-01 NOTE — Telephone Encounter (Signed)
[place]; spoke with [place], [place]. [id number] for refill on Clonazepam.  Advised the pt. has refills on file, and the reason it couldn't be filled previously, is that it was too early.  Reported pt. can have the Rx filled as of today.  Notified pt. Of the above.  The pt. Stated he actually has several days left, but called for refill per recommendation by the Pharmacy.  Advised pt. To call [facility] to further discuss when he wants to pick up the prescription.  Agreed with plan.

## 2018-07-01 NOTE — Telephone Encounter (Signed)
Patient notified directly and voiced understanding. Recommendations faxed over to Dr Senaida Ores office.

## 2018-07-01 NOTE — Progress Notes (Signed)
Chief Complaint  Patient presents with  . Follow-up    CAD   History of Present Illness: 68 yo male with history of sleep apnea, chronic pain syndrome, fibromyalgia, CAD s/p multiple prior PCIs, depression and anxiety, HTN, HLD and GERD here today for cardiac follow up. He has been followed in our office by Dr. Saunders Revel and previously by Dr. Aundra Dubin. He has been maintained on ASA and Effient 5 mg daily given multiple stents and poor platelet inhibition on Plavix. He had a Taxus DES placed in the mid LAD in 2008, Endeavor DES in the Circumflex in 2008, bare metal stent mid RCA in 2009, Xience DES Circumflex 2009. Last cardiac cath in 2013 with patent stents and non-obstructive CAD. Echo August 2019 with LVEF=55-60%. Grade 1 diastolic dysfunction. No significant valve disease. Nuclear stress test November 2019 with no ischemia. LVEF noted to be 47%.   He is here today for follow up. He is planning to have back surgery in the next few days and bariatric surgery in April 2020. The patient denies any chest pain, dyspnea, palpitations, lower extremity edema, orthopnea, PND, dizziness, near syncope or syncope. Limited by back pain.   Primary Care Physician: [name], MD  Past Medical History:  Diagnosis Date  . ALLERGIC RHINITIS 06/24/2009  . Anxiety 11/12/2011   Adequate for discharge   . Arthritis    "all my joints" (09/30/2013)  . Arthritis of foot, right, degenerative 04/15/2014  . Balance disorder 03/12/2013  . Benign neoplasm of cecum   . Benign neoplasm of descending colon   . CAD (coronary artery disease) 06/24/2009   5 stents placed in 2007    . Chronic anticoagulation   . Chronic pain syndrome 10/27/2009   of ankle, shoulders, low back.  sciatica.   . Closed fracture of right foot 10/17/2014  . CORONARY ARTERY DISEASE 06/24/2009   a. s/p multiple PCIs - In 2008 he had a Taxus DES to the mild LAD, Endeavor DES to mid LCX and distal LCX. In January 2009 he had DES to distal LCX, mid LCX and  proximal LCX. In November 2009 had BMS x 2 to the mid RCA. Cath 10/2011 with patent stents, noncardiac CP. LHC 01/2013: patent stents (noncardiac CP).  . DEGENERATIVE JOINT DISEASE 06/24/2009   Qualifier: Diagnosis of  By: [name], [name]   . Depression   . Depression with anxiety    Prior suicide attempt  . [place] DISEASE, LUMBAR 04/19/2010  . ERECTILE DYSFUNCTION, ORGANIC 05/30/2010  . Essential hypertension 06/24/2009   Qualifier: Diagnosis of  By: [name], [name]   . Fibromyalgia   . Fracture dislocation of ankle joint 09/02/2015  . Gait disorder 03/12/2013  . General weakness 07/14/2014  . GERD (gastroesophageal reflux disease) 09/08/2015  . Hand joint pain 06/10/2013  . Heart murmur   . Hepatitis C   . History of kidney stones   . Hyperlipidemia 07/15/2009   Qualifier: Diagnosis of  By: Aundra Dubin, MD, [place]    . HYPERLIPIDEMIA-MIXED 07/15/2009  . HYPERTENSION 06/24/2009  . Insomnia 10/04/2011  . Irregular heart beat   . Left hip pain 03/12/2013   Injected under ultrasound guidance on June 24, 2013   . Major depression 09/13/2015  . Myocardial infarction ([place]) 2008  . Non-cardiac chest pain 10/2011, 01/2013  . Obesity   . Occult blood positive stool 10/17/2014  . Open ankle fracture 09/02/2015  . OSA (obstructive sleep apnea)    not using CPAP (09/30/2013)  .  Pain of right thumb 04/03/2013  . Pneumonia   . PPD positive 04/08/2015  . Pre-ulcerative corn or callous 02/06/2013  . Rotator cuff tear arthropathy of both shoulders 06/10/2013   History of bilateral shoulder cuff surgery for rotator cuff tears. Reports increase in pain 09/11/2015 during physical therapy of the left shoulder.   [name] SCIATICA, LEFT 04/19/2010   Qualifier: Diagnosis of  By: [name], [name]   . Spinal stenosis in cervical region 09/26/2013  . Spinal stenosis, lumbar region, with neurogenic claudication 09/26/2013  . Type II diabetes mellitus ([place]) 2012   no meds in 09/2014.   [name] Uncontrolled type 2 DM with peripheral  circulatory disorder ([place]) 10/04/2013  . URETHRAL STRICTURE 06/24/2009   self catheterizes.     Past Surgical History:  Procedure Laterality Date  . AMPUTATION Right 06/14/2016   Procedure: AMPUTATION BELOW KNEE;  Surgeon: Newt Minion, MD;  Location: [facility] [place];  Service: Orthopedics;  Laterality: Right;  . ANKLE FUSION Right 04/15/2014   Procedure: Right Subtalar, Talonavicular Fusion;  Surgeon: Newt Minion, MD;  Location: [place];  Service: Orthopedics;  Laterality: Right;  . ANKLE FUSION Right 04/18/2016   Procedure: Right Ankle Tibiocalcaneal Fusion;  Surgeon: Newt Minion, MD;  Location: [place];  Service: Orthopedics;  Laterality: Right;  . ANTERIOR CERVICAL DECOMP/DISCECTOMY FUSION N/A 09/26/2013   Procedure: ANTERIOR CERVICAL DISCECTOMY FUSION C3-4, plate and screw fixation, allograft bone graft;  Surgeon: Jessy Oto, MD;  Location: [facility];  Service: Orthopedics;  Laterality: N/A;  . BACK SURGERY    . BELOW KNEE LEG AMPUTATION Right 06/14/2016  . CARDIAC CATHETERIZATION  X 1  . CARPAL TUNNEL RELEASE Bilateral   . COLONOSCOPY    . COLONOSCOPY N/A 10/22/2014   Procedure: COLONOSCOPY;  Surgeon: Lafayette Dragon, MD;  Location: [facility] ENDOSCOPY;  Service: Endoscopy;  Laterality: N/A;  . CORONARY ANGIOPLASTY WITH STENT PLACEMENT     "I have 9 stents"  . ESOPHAGOGASTRODUODENOSCOPY N/A 10/19/2014   Procedure: ESOPHAGOGASTRODUODENOSCOPY (EGD);  Surgeon: Jerene Bears, MD;  Location: [facility] ENDOSCOPY;  Service: Endoscopy;  Laterality: N/A;  . FRACTURE SURGERY    . FUSION OF TALONAVICULAR JOINT Right 04/15/2014   dr duda  . HERNIA REPAIR     umbilical  . INGUINAL HERNIA REPAIR Right 05/11/2015   Procedure: LAPAROSCOPIC REPAIR RIGHT  INGUINAL HERNIA;  Surgeon: Greer Pickerel, MD;  Location: [place];  Service: General;  Laterality: Right;  . INSERTION OF MESH Right 05/11/2015   Procedure: INSERTION OF MESH;  Surgeon: Greer Pickerel, MD;  Location: [place];  Service: General;  Laterality: Right;  . JOINT REPLACEMENT      . KNEE CARTILAGE SURGERY Right X 12   "~ 1/2 open; ~ 1/2 scopes"  . KNEE CARTILAGE SURGERY Left X 3   "3 scopes"  . LEFT HEART CATHETERIZATION WITH CORONARY ANGIOGRAM N/A 02/10/2013   Procedure: LEFT HEART CATHETERIZATION WITH CORONARY ANGIOGRAM;  Surgeon: Burnell Blanks, MD;  Location: [facility] CATH LAB;  Service: Cardiovascular;  Laterality: N/A;  . LUMBAR LAMINECTOMY/DECOMPRESSION MICRODISCECTOMY N/A 01/27/2014   Procedure: CENTRAL LUMBAR LAMINECTOMY L4-5 AND L3-4;  Surgeon: Jessy Oto, MD;  Location: [place];  Service: Orthopedics;  Laterality: N/A;  . ORIF ANKLE FRACTURE Right 09/02/2015   Procedure: OPEN REDUCTION INTERNAL FIXATION (ORIF) ANKLE FRACTURE;  Surgeon: Newt Minion, MD;  Location: [place];  Service: Orthopedics;  Laterality: Right;  . PERIPHERALLY INSERTED CENTRAL CATHETER INSERTION  09/02/2015  . SHOULDER ARTHROSCOPY W/ ROTATOR  CUFF REPAIR Bilateral    "3 on the right; 1 on the left"  . SKIN SPLIT GRAFT Right 10/01/2015   Procedure: RIGHT ANKLE APPLY SKIN GRAFT SPLIT THICKNESS;  Surgeon: Newt Minion, MD;  Location: [place];  Service: Orthopedics;  Laterality: Right;  . TONSILLECTOMY    . TOOTH EXTRACTION    . TOTAL HIP ARTHROPLASTY Right 04/17/2018  . TOTAL HIP ARTHROPLASTY Right 04/17/2018   Procedure: RIGHT TOTAL HIP ARTHROPLASTY;  Surgeon: Newt Minion, MD;  Location: [place];  Service: Orthopedics;  Laterality: Right;  . TOTAL KNEE ARTHROPLASTY Bilateral 2008  . UMBILICAL HERNIA REPAIR     UHR  . URETHRAL DILATION  X 4  . VASECTOMY    . WISDOM TOOTH EXTRACTION      Current Outpatient Medications  Medication Sig Dispense Refill  . ARIPiprazole (ABILIFY) 5 MG tablet Take 5 mg by mouth daily.    [name] aspirin 81 MG tablet Take 81 mg by mouth daily.    [name] atorvastatin (LIPITOR) 20 MG tablet Take 1 tablet (20 mg total) by mouth daily. 90 tablet 3  . benztropine (COGENTIN) 0.5 MG tablet Take 1 tablet (0.5 mg total) by mouth at bedtime. 90 tablet 0  . clonazePAM (KLONOPIN)  0.5 MG tablet Take 1 tablet (0.5 mg total) by mouth at bedtime. 30 tablet 2  . cyclobenzaprine (FLEXERIL) 5 MG tablet TAKE ONE TABLET BY MOUTH THREE TIMES DAILY AS NEEDED muscle spasm 30 tablet 2  . DULoxetine (CYMBALTA) 30 MG capsule Take 30 mg by mouth 2 (two) times daily.    [name] gabapentin (NEURONTIN) 800 MG tablet Take 800 mg by mouth 3 (three) times daily.    . hydrochlorothiazide (HYDRODIURIL) 25 MG tablet Take 25 mg by mouth daily.    . Melatonin 5 MG TABS Take 20 mg by mouth at bedtime.     . mirabegron ER (MYRBETRIQ) 50 MG TB24 tablet Take 50 mg by mouth at bedtime.     . Omega-3 Fatty Acids (FISH OIL) 1000 MG CAPS Take 1,000 mg by mouth at bedtime.     [name] oxyCODONE-acetaminophen (PERCOCET) 10-325 MG tablet Take 1 tablet by mouth every 6 (six) hours as needed for pain.    [name] oxyCODONE-acetaminophen (PERCOCET/ROXICET) 5-325 MG tablet Take 1 tablet by mouth every 4 (four) hours as needed. 30 tablet 0  . pantoprazole (PROTONIX) 40 MG tablet TAKE ONE TABLET BY MOUTH EVERY DAY 30 tablet 2  . prasugrel (EFFIENT) 5 MG TABS tablet TAKE ONE TABLET BY MOUTH EVERY DAY (Patient taking differently: Take 5 mg by mouth at bedtime. ) 90 tablet 3  . traZODone (DESYREL) 150 MG tablet Take 1 tablet (150 mg total) by mouth at bedtime. 90 tablet 0   No current facility-administered medications for this visit.     No Known Allergies  Social History   Socioeconomic History  . Marital status: Divorced    Spouse name: Not on file  . Number of children: 2  . Years of education: Not on file  . Highest education level: Not on file  Occupational History  . Occupation: disabled since 2006 due to ortho. heart, psych    Employer: UNEMPLOYED  . Occupation: part time work as an Multimedia programmer, wrestling, and Holiday representative  Social Needs  . Financial resource strain: Not on file  . Food insecurity:    Worry: Not on file    Inability: Not on file  . Transportation needs:    Medical: Not  on file    Non-medical: Not on file  Tobacco Use  . Smoking status: Former Smoker    Types: Cigars    Last attempt to quit: 08/28/2010    Years since quitting: 7.8  . Smokeless tobacco: Never Used  . Tobacco comment: 04/18/2016 "smoked 1 cigar/wk when I did smoke"  Substance and Sexual Activity  . Alcohol use: No    Alcohol/week: 0.0 standard drinks    Comment: rarely  . Drug use: No  . Sexual activity: Not Currently  Lifestyle  . Physical activity:    Days per week: Not on file    Minutes per session: Not on file  . Stress: Not on file  Relationships  . Social connections:    Talks on phone: Not on file    Gets together: Not on file    Attends religious service: Not on file    Active member of club or organization: Not on file    Attends meetings of clubs or organizations: Not on file    Relationship status: Not on file  . Intimate partner violence:    Fear of current or ex partner: Not on file    Emotionally abused: Not on file    Physically abused: Not on file    Forced sexual activity: Not on file  Other Topics Concern  . Not on file  Social History Narrative   Played semi-pro football, used steroids   Divorced moved here from [place], [place]   Patient states he has been on disability since his knee surgery.      03/05/2013 AHW "Travis [name]" was born and grew up in [place], [place], and moved to [place], [place] at age 58. He has one sister and 3 brothers. He reports that his childhood was rough. His parents are deceased. He graduated from [employer], and attended one year of college. He is currently unemployed and on disability for multiple medical problems. He has been married 5 times. He has 2 sons. He currently lives alone. 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 He denies that he has any social support system. 03/05/2013 AHW          Family History  Problem Relation Age of Onset  . Depression Mother   . Heart disease  Mother   . Hypertension Mother   . Breast cancer Mother   . Diabetes Father   . Heart disease Father        CABG  . Hypertension Father   . Hyperlipidemia Father   . Prostate cancer Father   . Skin cancer Father   . Depression Brother        x 2  . Hypertension Brother        x2  . Healthy Son   . Heart disease Maternal Grandfather   . Early death Maternal Grandfather   . Heart attack Maternal Grandfather 08-09-63  . Early death Paternal Grandfather   . Coronary artery disease Other   . Hypertension Other   . Depression Other   . Healthy Son     Review of Systems:  As stated in the HPI and otherwise negative.   BP (!) 142/88   Pulse 76   Ht 6' (1.829 m)   Wt (!) 333 lb 12.8 oz (151.4 kg)   SpO2 97%   BMI 45.27 kg/m   Physical Examination: General: Morbidly obese WM. NAD  HEENT: OP clear, mucus membranes moist  SKIN: warm, dry. No rashes. Neuro:  No focal deficits  Musculoskeletal: Muscle strength 5/5 all ext  Psychiatric: Mood and affect normal  Neck: No JVD, no carotid bruits, no thyromegaly, no lymphadenopathy.  Lungs:Clear bilaterally, no wheezes, rhonci, crackles Cardiovascular: Regular rate and rhythm. No murmurs, gallops or rubs. Abdomen:Soft. Bowel sounds present. Non-tender.  Extremities: No lower extremity edema. Pulses are 2 + in the bilateral DP/PT.  Echo 01/09/18: - Left ventricle: The cavity size was normal. Systolic function was   normal. The estimated ejection fraction was in the range of 55%   to 60%. Wall motion was normal; there were no regional wall   motion abnormalities. Doppler parameters are consistent with   abnormal left ventricular relaxation (grade 1 diastolic   dysfunction). - Aortic valve: Transvalvular velocity was within the normal range.   There was no stenosis. There was no regurgitation. - Mitral valve: Transvalvular velocity was within the normal range.   There was no evidence for stenosis. There was trivial   regurgitation. -  Right ventricle: The cavity size was normal. Wall thickness was   normal. Systolic function was normal. - Tricuspid valve: There was trivial regurgitation. - Pulmonary arteries: Systolic pressure was within the normal   range. PA peak pressure: 24 mm Hg (S).  EKG:  EKG is not ordered today. The ekg ordered today demonstrates   Recent Labs: 06/03/2018: ALT 16; BUN 14; Creatinine, Ser 0.99; Hemoglobin 12.2; Platelets 288.0; Potassium 4.2; Sodium 138; TSH 1.69   Lipid Panel    Component Value Date/Time   CHOL 124 06/03/2018 1333   TRIG 119.0 06/03/2018 1333   HDL 39.00 (L) 06/03/2018 1333   CHOLHDL 3 06/03/2018 1333   VLDL 23.8 06/03/2018 1333   LDLCALC 61 06/03/2018 1333     Wt Readings from Last 3 Encounters:  07/01/18 (!) 333 lb 12.8 oz (151.4 kg)  06/20/18 (!) 326 lb (147.9 kg)  06/13/18 (!) 326 lb (147.9 kg)     Other studies Reviewed: Additional studies/ records that were reviewed today include:  Review of the above records demonstrates:    Assessment and Plan:   1. CAD without angina: No chest pain. Will continue ASA and Effient 5 mg daily. He can hold his ASA and Plavix 5 days prior to his planned back surgery and also before his bariatric surgery.   2. Chronic diastolic CHF: Volume status seems to be stable. Difficult to assess given his size but he has no left LE edema. Weight overall stable. Continue HCTZ.   3. HTN: BP is well controlled at home. No changes today  4. Morbid obesity: He is planning bariatric surgery this spring.   5. Pre-operative cardiovascular risk assessment: He is overall limited in mobility due to his obesity and back pain. He has no concerning symptoms suggestive of angina, arrhythmias or CHF. Stress test November 2019 with no ischemia. He can proceed with his planned surgical procedure. He can hold his ASA and Effient 5 days before his planned procedure.   45 minutes spent on chart review, face to face time and formulating plan.   Current  medicines are reviewed at length with the patient today.  The patient does not have concerns regarding medicines.  The following changes have been made:  no change  Labs/ tests ordered today include:  No orders of the defined types were placed in this encounter.    Disposition:   FU with me in one year.    Signed, Lauree Chandler, MD 07/01/2018 3:16 PM    [facility] [place], [place], [place]  [address] Phone: [phone]; Fax: [phone]

## 2018-07-04 ENCOUNTER — Ambulatory Visit (INDEPENDENT_AMBULATORY_CARE_PROVIDER_SITE_OTHER): Payer: Medicare HMO | Admitting: Specialist

## 2018-07-04 ENCOUNTER — Encounter (INDEPENDENT_AMBULATORY_CARE_PROVIDER_SITE_OTHER): Payer: Self-pay | Admitting: Specialist

## 2018-07-04 ENCOUNTER — Other Ambulatory Visit: Payer: Self-pay | Admitting: Internal Medicine

## 2018-07-04 VITALS — BP 153/84 | HR 73 | Ht 72.0 in | Wt 334.0 lb

## 2018-07-04 DIAGNOSIS — M48062 Spinal stenosis, lumbar region with neurogenic claudication: Secondary | ICD-10-CM

## 2018-07-04 DIAGNOSIS — M51369 Other intervertebral disc degeneration, lumbar region without mention of lumbar back pain or lower extremity pain: Secondary | ICD-10-CM

## 2018-07-04 DIAGNOSIS — Z6841 Body Mass Index (BMI) 40.0 and over, adult: Secondary | ICD-10-CM | POA: Diagnosis not present

## 2018-07-04 DIAGNOSIS — R6889 Other general symptoms and signs: Secondary | ICD-10-CM | POA: Diagnosis not present

## 2018-07-04 DIAGNOSIS — M5136 Other intervertebral disc degeneration, lumbar region: Secondary | ICD-10-CM

## 2018-07-04 NOTE — Progress Notes (Signed)
Office Visit Note   Patient: Travis KIMBERLIN Sr.           Date of Birth: 1950-08-07           MRN: [id number] Visit Date: 07/04/2018              Requested by: [name], MD [address] [place], [place] [address] PCP: [name], MD   Assessment & Plan: Visit Diagnoses:  1. Spinal stenosis of lumbar region with neurogenic claudication   2. Degenerative disc disease, lumbar      Plan: Avoid bending, stooping and avoid lifting weights greater than 10 lbs. Avoid prolong standing and walking. Order for a new walker with wheels. Surgery scheduling secretary Kandice Hams, will call you in the next week to schedule for surgery.  Surgery recommended is a one level left lumbar L4-5 redo partial hemilaminectomy with foramenotomy of left L4 this would be done with microscope.  Take hydrocodone for for pain. Risk of surgery includes risk of infection 1 in 200 patients, bleeding 1/2% chance you would need a transfusion.   Risk to the nerves is one in 10,000.  Expect improved walking and standing tolerance. Expect relief of leg pain but numbness may persist depending on the length and degree of pressure that has been present.  Follow-Up Instructions: No follow-ups on file.   Orders:  No orders of the defined types were placed in this encounter.  No orders of the defined types were placed in this encounter.     Procedures: No procedures performed   Clinical Data: No additional findings.   Subjective: Chief Complaint  Patient presents with  . Lower Back - Follow-up    68 year old male with history of previous lumbar laminectomy surgery and did well for a period of time he has  Undergone right BKA for a severe right planovalgus deformity and attempted fusion went on to an infected nonunion. He has been awaiting approval of lumbar surgery of a condition of recurrent lumbar spinal stenosis with left mid back pain with radiation into the buttocks. Even moving  the right leg causes the left leg to hurt. Experiencing problems with bowel accidents, last night with severe diarrhea, up last night with urge to go to the bathroom, went through 3 pull ups.  He is limited in his walking from the waiting room to the exam room and he was done. He Is seen today wanting to schedule surgery acutely. But we are awaiting his primary care provider's clearance with Dr. Jenny Reichmann. He is taking oxycodone 10/338m one every 6 hours. Pain is severe and it laughs at the meds. He is stooped in his standing posture. Going from sitting to lying it is nearly impossible due to pain, it goes to a 10 real quick.    Review of Systems  Constitutional: Negative for activity change, appetite change, chills, diaphoresis, fatigue, fever and unexpected weight change.  HENT: Negative.  Negative for congestion, dental problem, drooling, ear discharge, ear pain, facial swelling, hearing loss, mouth sores, nosebleeds, postnasal drip, rhinorrhea, sinus pressure, sinus pain, sneezing, sore throat, tinnitus, trouble swallowing and voice change.   Eyes: Negative.   Respiratory: Negative.  Negative for apnea, cough, choking, chest tightness, shortness of breath, wheezing and stridor.   Cardiovascular: Negative.  Negative for chest pain, palpitations and leg swelling.  Gastrointestinal: Positive for abdominal distention, abdominal pain and diarrhea. Negative for anal bleeding, blood in stool, constipation, nausea, rectal pain and vomiting.  Endocrine: Negative.  Negative for cold intolerance, heat intolerance, polydipsia, polyphagia and polyuria.  Genitourinary: Negative.  Negative for decreased urine volume, difficulty urinating, discharge, dysuria, enuresis, flank pain, frequency, genital sores, hematuria, penile pain, penile swelling, scrotal swelling and urgency.  Musculoskeletal: Negative.   Skin: Negative.   Allergic/Immunologic: Negative.   Neurological: Negative.   Hematological: Negative.     Psychiatric/Behavioral: Negative.      Objective: Vital Signs: BP (!) 153/84   Pulse 73   Ht 6' (1.829 m)   Wt (!) 334 lb (151.5 kg)   BMI 45.30 kg/m   Physical Exam Constitutional:      Appearance: He is well-developed.  HENT:     Head: Normocephalic and atraumatic.  Eyes:     Pupils: Pupils are equal, round, and reactive to light.  Neck:     Musculoskeletal: Normal range of motion and neck supple.  Pulmonary:     Effort: Pulmonary effort is normal.     Breath sounds: Normal breath sounds.  Abdominal:     General: Bowel sounds are normal.     Palpations: Abdomen is soft.  Skin:    General: Skin is warm and dry.  Neurological:     Mental Status: He is alert and oriented to person, place, and time.  Psychiatric:        Behavior: Behavior normal.        Thought Content: Thought content normal.        Judgment: Judgment normal.     Back Exam   Tenderness  The patient is experiencing tenderness in the lumbar.  Range of Motion  Extension: abnormal  Flexion: abnormal  Lateral bend right: abnormal  Lateral bend left: abnormal  Rotation right: abnormal  Rotation left: abnormal   Muscle Strength  Right Quadriceps:  5/5  Left Quadriceps:  5/5  Right Hamstrings:  5/5  Left Hamstrings:  5/5   Tests  Straight leg raise right: negative Straight leg raise left: negative  Other  Toe walk: normal Heel walk: normal Sensation: normal Gait: antalgic  Erythema: no back redness Scars: absent      Specialty Comments:  No specialty comments available.  Imaging: No results found.   PMFS History: Patient Active Problem List   Diagnosis Date Noted  . Spinal stenosis in cervical region 09/26/2013    Priority: High    Class: Chronic  . Spinal stenosis, lumbar region, with neurogenic claudication 09/26/2013    Priority: High    Class: Chronic  . Status post THR (total hip replacement) 04/17/2018  . Unilateral primary osteoarthritis, right hip   . Morbid  obesity with BMI of 40.0-44.9, adult ([place]) 02/28/2018  . Myocardial infarction ([place]) 02/25/2018  . Coronary artery disease involving native coronary artery of native heart 02/25/2018  . PAD (peripheral artery disease) ([place]) 02/25/2018  . S/P BKA (below knee amputation) unilateral, right ([place]) 02/25/2018  . Sacroiliitis ([place]) 02/25/2018  . SOB (shortness of breath) 01/03/2018  . Acute on chronic heart failure ([name]) 01/03/2018  . Severe right groin pain 12/20/2017  . Morbid obesity ([place]) 10/09/2017  . Exposure to sexually transmitted disease (STD) 07/14/2017  . Low back pain 07/13/2017  . Leukoplakia, tongue 01/26/2017  . Acute sinus infection 10/20/2016  . Hyperglycemia 10/20/2016  . Skin lesion 10/20/2016  . S/P unilateral BKA (below knee amputation), right ([place]) 06/14/2016  . Charcot foot due to diabetes mellitus ([place])   . Charcot's arthropathy associated with type 2 diabetes mellitus ([place]) 04/11/2016  . Preventative health  care 11/05/2015  . Major depression 09/13/2015  . S/P TKR (total knee replacement) bilaterally 09/13/2015  . GERD (gastroesophageal reflux disease) 09/08/2015  . S/P laparoscopic hernia repair 05/11/2015  . PPD positive 04/08/2015  . Benign neoplasm of descending colon   . Benign neoplasm of cecum   . AKI (acute kidney injury) ([place]) 10/18/2014  . Acute blood loss anemia   . Chronic anticoagulation   . Occult blood positive stool 10/17/2014  . General weakness 07/14/2014  . Urinary incontinence 07/14/2014  . Headache(784.0) 10/15/2013  . Hand joint pain 06/10/2013  . Rotator cuff tear arthropathy of both shoulders 06/10/2013  . Skin lesion of cheek 05/01/2013  . Pain of right thumb 04/03/2013  . Balance disorder 03/12/2013  . Gait disorder 03/12/2013  . Tremor 03/12/2013  . Left hip pain 03/12/2013  . Pre-ulcerative corn or callous 02/06/2013  . Anxiety 11/12/2011  . OSA (obstructive sleep apnea) 11/07/2011  . Bradycardia 10/20/2011  . Insomnia  10/04/2011  . Obesity 01/12/2011  . ERECTILE DYSFUNCTION, ORGANIC 05/30/2010  . [place] DISEASE, LUMBAR 04/19/2010  . SCIATICA, LEFT 04/19/2010  . Chronic pain syndrome 10/27/2009  . Hyperlipidemia 07/15/2009  . Essential hypertension 06/24/2009  . Coronary artery disease involving native coronary artery of native heart without angina pectoris 06/24/2009  . Allergic rhinitis 06/24/2009  . URETHRAL STRICTURE 06/24/2009  . DEGENERATIVE JOINT DISEASE 06/24/2009  . SHOULDER PAIN, BILATERAL 06/24/2009  . FATIGUE 06/24/2009  . NEPHROLITHIASIS, HX OF 06/24/2009   Past Medical History:  Diagnosis Date  . ALLERGIC RHINITIS 06/24/2009  . Anxiety 11/12/2011   Adequate for discharge   . Arthritis    "all my joints" (09/30/2013)  . Arthritis of foot, right, degenerative 04/15/2014  . Balance disorder 03/12/2013  . Benign neoplasm of cecum   . Benign neoplasm of descending colon   . CAD (coronary artery disease) 06/24/2009   5 stents placed in 2007    . Chronic anticoagulation   . Chronic pain syndrome 10/27/2009   of ankle, shoulders, low back.  sciatica.   . Closed fracture of right foot 10/17/2014  . CORONARY ARTERY DISEASE 06/24/2009   a. s/p multiple PCIs - In 2008 he had a Taxus DES to the mild LAD, Endeavor DES to mid LCX and distal LCX. In January 2009 he had DES to distal LCX, mid LCX and proximal LCX. In November 2009 had BMS x 2 to the mid RCA. Cath 10/2011 with patent stents, noncardiac CP. LHC 01/2013: patent stents (noncardiac CP).  . DEGENERATIVE JOINT DISEASE 06/24/2009   Qualifier: Diagnosis of  By: [name], [name]   . Depression   . Depression with anxiety    Prior suicide attempt  . [place] DISEASE, LUMBAR 04/19/2010  . ERECTILE DYSFUNCTION, ORGANIC 05/30/2010  . Essential hypertension 06/24/2009   Qualifier: Diagnosis of  By: [name], [name]   . Fibromyalgia   . Fracture dislocation of ankle joint 09/02/2015  . Gait disorder 03/12/2013  . General weakness 07/14/2014  . GERD  (gastroesophageal reflux disease) 09/08/2015  . Hand joint pain 06/10/2013  . Heart murmur   . Hepatitis C   . History of kidney stones   . Hyperlipidemia 07/15/2009   Qualifier: Diagnosis of  By: Aundra Dubin, MD, [place]    . HYPERLIPIDEMIA-MIXED 07/15/2009  . HYPERTENSION 06/24/2009  . Insomnia 10/04/2011  . Irregular heart beat   . Left hip pain 03/12/2013   Injected under ultrasound guidance on June 24, 2013   . Major depression 09/13/2015  .  Myocardial infarction ([place]) 11-Aug-2006  . Non-cardiac chest pain 10/2011, 01/2013  . Obesity   . Occult blood positive stool 10/17/2014  . Open ankle fracture 09/02/2015  . OSA (obstructive sleep apnea)    not using CPAP (09/30/2013)  . Pain of right thumb 04/03/2013  . Pneumonia   . PPD positive 04/08/2015  . Pre-ulcerative corn or callous 02/06/2013  . Rotator cuff tear arthropathy of both shoulders 06/10/2013   History of bilateral shoulder cuff surgery for rotator cuff tears. Reports increase in pain 09/11/2015 during physical therapy of the left shoulder.   [name] SCIATICA, LEFT 04/19/2010   Qualifier: Diagnosis of  By: [name], [name]   . Spinal stenosis in cervical region 09/26/2013  . Spinal stenosis, lumbar region, with neurogenic claudication 09/26/2013  . Type II diabetes mellitus ([place]) 08-11-2010   no meds in 09/2014.   [name] Uncontrolled type 2 DM with peripheral circulatory disorder ([place]) 10/04/2013  . URETHRAL STRICTURE 06/24/2009   self catheterizes.     Family History  Problem Relation Age of Onset  . Depression Mother   . Heart disease Mother   . Hypertension Mother   . Breast cancer Mother   . Diabetes Father   . Heart disease Father        CABG  . Hypertension Father   . Hyperlipidemia Father   . Prostate cancer Father   . Skin cancer Father   . Depression Brother        x 2  . Hypertension Brother        x2  . Healthy Son   . Heart disease Maternal Grandfather   . Early death Maternal Grandfather   . Heart attack Maternal Grandfather 08-12-63  .  Early death Paternal Grandfather   . Coronary artery disease Other   . Hypertension Other   . Depression Other   . Healthy Son     Past Surgical History:  Procedure Laterality Date  . AMPUTATION Right 06/14/2016   Procedure: AMPUTATION BELOW KNEE;  Surgeon: Newt Minion, MD;  Location: [facility];  Service: Orthopedics;  Laterality: Right;  . ANKLE FUSION Right 04/15/2014   Procedure: Right Subtalar, Talonavicular Fusion;  Surgeon: Newt Minion, MD;  Location: [facility];  Service: Orthopedics;  Laterality: Right;  . ANKLE FUSION Right 04/18/2016   Procedure: Right Ankle Tibiocalcaneal Fusion;  Surgeon: Newt Minion, MD;  Location: [facility];  Service: Orthopedics;  Laterality: Right;  . ANTERIOR CERVICAL DECOMP/DISCECTOMY FUSION N/A 09/26/2013   Procedure: ANTERIOR CERVICAL DISCECTOMY FUSION C3-4, plate and screw fixation, allograft bone graft;  Surgeon: Jessy Oto, MD;  Location: [facility];  Service: Orthopedics;  Laterality: N/A;  . BACK SURGERY    . BELOW KNEE LEG AMPUTATION Right 06/14/2016  . CARDIAC CATHETERIZATION  X 1  . CARPAL TUNNEL RELEASE Bilateral   . COLONOSCOPY    . COLONOSCOPY N/A 10/22/2014   Procedure: COLONOSCOPY;  Surgeon: Lafayette Dragon, MD;  Location: [facility] ENDOSCOPY;  Service: Endoscopy;  Laterality: N/A;  . CORONARY ANGIOPLASTY WITH STENT PLACEMENT     "I have 9 stents"  . ESOPHAGOGASTRODUODENOSCOPY N/A 10/19/2014   Procedure: ESOPHAGOGASTRODUODENOSCOPY (EGD);  Surgeon: Jerene Bears, MD;  Location[facility] ENDOSCOPY;  Service: Endoscopy;  Laterality: N/A;  . FRACTURE SURGERY    . FUSION OF TALONAVICULAR JOINT Right 04/15/2014   dr duda  . HERNIA REPAIR     umbilical  . INGUINAL HERNIA REPAIR Right 05/11/2015   Procedure: LAPAROSCOPIC REPAIR RIGHT  INGUINAL  HERNIA;  Surgeon: Greer Pickerel, MD;  Location: [facility];  Service: General;  Laterality: Right;  . INSERTION OF MESH Right 05/11/2015   Procedure: INSERTION OF MESH;  Surgeon: Greer Pickerel, MD;  Location: [facility];  Service: General;   Laterality: Right;  . JOINT REPLACEMENT    . KNEE CARTILAGE SURGERY Right X 12   "~ 1/2 open; ~ 1/2 scopes"  . KNEE CARTILAGE SURGERY Left X 3   "3 scopes"  . LEFT HEART CATHETERIZATION WITH CORONARY ANGIOGRAM N/A 02/10/2013   Procedure: LEFT HEART CATHETERIZATION WITH CORONARY ANGIOGRAM;  Surgeon: Burnell Blanks, MD;  Location[facility] C[facility] LAB;  Service: Cardiovascular;  Laterality: N/A;  . LUMBAR LAMINECTOMY/DECOMPRESSION MICRODISCECTOMY N/A 01/27/2014   Procedure: CENTRAL LUMBAR LAMINECTOMY L4-5 AND L3-4;  Surgeon: Jessy Oto, MD;  Location: [facility];  Service: Orthopedics;  Laterality: N/A;  . ORIF ANKLE FRACTURE Right 09/02/2015   Procedure: OPEN REDUCTION INTERNAL FIXATION (ORIF) ANKLE FRACTURE;  Surgeon: Newt Minion, MD;  Location: [facility];  Service: Orthopedics;  Laterality: Right;  . PERIPHERALLY INSERTED CENTRAL CATHETER INSERTION  09/02/2015  . SHOULDER ARTHROSCOPY W/ ROTATOR CUFF REPAIR Bilateral    "3 on the right; 1 on the left"  . SKIN SPLIT GRAFT Right 10/01/2015   Procedure: RIGHT ANKLE APPLY SKIN GRAFT SPLIT THICKNESS;  Surgeon: Newt Minion, MD;  Location: [facility];  Service: Orthopedics;  Laterality: Right;  . TONSILLECTOMY    . TOOTH EXTRACTION    . TOTAL HIP ARTHROPLASTY Right 04/17/2018  . TOTAL HIP ARTHROPLASTY Right 04/17/2018   Procedure: RIGHT TOTAL HIP ARTHROPLASTY;  Surgeon: Newt Minion, MD;  Location: [facility];  Service: Orthopedics;  Laterality: Right;  . TOTAL KNEE ARTHROPLASTY Bilateral 2008  . UMBILICAL HERNIA REPAIR     UHR  . URETHRAL DILATION  X 4  . VASECTOMY    . WISDOM TOOTH EXTRACTION     Social History   Occupational History  . Occupation: disabled since 2006 due to ortho. heart, psych    Employer: UNEMPLOYED  . Occupation: part time work as an Multimedia programmer, wrestling, and Holiday representative  Tobacco Use  . Smoking status: Former Smoker    Types: Cigars    Last attempt to quit: 08/28/2010    Years since quitting: 7.8  .  Smokeless tobacco: Never Used  . Tobacco comment: 04/18/2016 "smoked 1 cigar/wk when I did smoke"  Substance and Sexual Activity  . Alcohol use: No    Alcohol/week: 0.0 standard drinks    Comment: rarely  . Drug use: No  . Sexual activity: Not Currently

## 2018-07-05 ENCOUNTER — Encounter (INDEPENDENT_AMBULATORY_CARE_PROVIDER_SITE_OTHER): Payer: Self-pay | Admitting: Specialist

## 2018-07-05 NOTE — Patient Instructions (Addendum)
Avoid bending, stooping and avoid lifting weights greater than 10 lbs. Avoid prolong standing and walking. Order for a new walker with wheels. Surgery scheduling secretary Kandice Hams, will call you in the next week to schedule for surgery.  Surgery recommended is a one level left lumbar L4-5 redo partial hemilaminectomy with foramenotomy of left L4 this would be done with microscope.  Take hydrocodone for for pain. Risk of surgery includes risk of infection 1 in 200 patients, bleeding 1/2% chance you would need a transfusion.   Risk to the nerves is one in 10,000. Expect improved walking and standing tolerance. Expect relief of leg pain but numbness may persist depending on the length and degree of pressure that has been present

## 2018-07-08 ENCOUNTER — Other Ambulatory Visit (HOSPITAL_COMMUNITY): Payer: Self-pay | Admitting: Psychiatry

## 2018-07-08 DIAGNOSIS — F319 Bipolar disorder, unspecified: Secondary | ICD-10-CM

## 2018-07-11 ENCOUNTER — Ambulatory Visit: Payer: Medicare HMO | Admitting: Psychiatry

## 2018-07-12 ENCOUNTER — Ambulatory Visit (INDEPENDENT_AMBULATORY_CARE_PROVIDER_SITE_OTHER): Payer: Medicare HMO | Admitting: Specialist

## 2018-07-12 NOTE — Pre-Procedure Instructions (Signed)
[facility]  07/12/2018      [address], [place], [place] [address] [place] [address] Phone: [phone] Fax: [phone]    Your procedure is scheduled on 07-16-2018  Tuesday .  Report to [facility] Admitting at 5:30 A.M.   Call this number if you have problems the morning of surgery:  [phone]   Remember:  Do not eat food or drink liquids after midnight.                        Take these medicines the morning of surgery with A SIP OF WATER  Cyclobenzaprine(Flexeril) if needed Duloxetine(Cymbalta) Gabapentin(Neurontin) Hydrocodone(Norco) if needed for pain  Follow your doctors instructions regarding your Aspirin. If no instructions were given by your doctor ,then you will need to call the prescribing office to get instructions.  Follow instructions given by your surgeon for Effient, if no instructions were given call the prescribing doctor for instructions.  STOP TAKING ANY ASPIRIN (UNLESS OTHERWISE INSTRUCTED BY YOUR SURGEON),ANTIINFLAMATORIES (IBUPROFEN,ALEVE,MOTRIN,ADVIL,GOODY'S POWDERS),HERBAL SUPPLEMENTS,FISH OIL,AND VITAMINS 5-7 DAYS PRIOR TO SURGERY    Do not wear jewelry, make-up or nail polish.  Do not wear lotions, powders, or perfumes, or deodorant.  Do not shave 48 hours prior to surgery.  Men may shave face and neck.  Do not bring valuables to the hospital.  [facility] is not responsible for any belongings or valuables.  Contacts, dentures or bridgework may not be worn into surgery.  Leave your suitcase in the car.  After surgery it may be brought to your room.  For patients admitted to the hospital, discharge time will be determined by your treatment team.  Patients discharged the day of surgery will not be allowed to drive home.     [facility] - Preparing for Surgery  Before surgery, you can play an important role.  Because skin is not sterile, your skin needs to  be as free of germs as possible.  You can reduce the number of germs on you skin by washing with CHG (chlorahexidine gluconate) soap before surgery.  CHG is an antiseptic cleaner which kills germs and bonds with the skin to continue killing germs even after washing.  Oral Hygiene is also important in reducing the risk of infection.  Remember to brush your teeth with your regular toothpaste the morning of surgery.  Please DO NOT use if you have an allergy to CHG or antibacterial soaps.  If your skin becomes reddened/irritated stop using the CHG and inform your nurse when you arrive at Short Stay.  Do not shave (including legs and underarms) for at least 48 hours prior to the first CHG shower.  You may shave your face.  Please follow these instructions carefully:   1.  Shower with CHG Soap the night before surgery and the morning of Surgery.  2.  If you choose to wash your hair, wash your hair first as usual with your normal shampoo.  3.  After you shampoo, rinse your hair and body thoroughly to remove the shampoo. 4.  Use CHG as you would any other liquid soap.  You can apply chg directly to the skin and wash gently with a      scrungie or washcloth.           5.  Apply the CHG Soap to your body ONLY FROM THE NECK DOWN.   Do not  use on open wounds or open sores. Avoid contact with your eyes, ears, mouth and genitals (private parts).  Wash genitals (private parts) with your normal soap.  6.  Wash thoroughly, paying special attention to the area where your surgery will be performed.  7.  Thoroughly rinse your body with warm water from the neck down.  8.  DO NOT shower/wash with your normal soap after using and rinsing off the CHG Soap.  9.  Pat yourself dry with a clean towel.            10.  Wear clean pajamas.            11.  Place clean sheets on your bed the night of your first shower and do not sleep with pets.  Day of Surgery  Do not apply any lotions/deoderants the morning of surgery.    Please wear clean clothes to the hospital/surgery center. Remember to brush your teeth with toothpaste.    Please read over the following fact sheets that you were given. Pain Booklet, Coughing and Deep Breathing and Surgical Site Infection Prevention

## 2018-07-15 ENCOUNTER — Encounter: Payer: Medicare HMO | Admitting: Skilled Nursing Facility1

## 2018-07-15 ENCOUNTER — Encounter (HOSPITAL_COMMUNITY)
Admission: RE | Admit: 2018-07-15 | Discharge: 2018-07-15 | Disposition: A | Discharge status: Home / Self Care | Payer: Medicare HMO | Source: Ambulatory Visit | Attending: Specialist | Admitting: Specialist

## 2018-07-15 ENCOUNTER — Encounter (HOSPITAL_COMMUNITY): Payer: Self-pay

## 2018-07-15 DIAGNOSIS — G894 Chronic pain syndrome: Secondary | ICD-10-CM | POA: Diagnosis present

## 2018-07-15 DIAGNOSIS — Z01812 Encounter for preprocedural laboratory examination: Secondary | ICD-10-CM | POA: Insufficient documentation

## 2018-07-15 DIAGNOSIS — Z6841 Body Mass Index (BMI) 40.0 and over, adult: Secondary | ICD-10-CM | POA: Diagnosis not present

## 2018-07-15 DIAGNOSIS — K219 Gastro-esophageal reflux disease without esophagitis: Secondary | ICD-10-CM | POA: Diagnosis present

## 2018-07-15 DIAGNOSIS — M5430 Sciatica, unspecified side: Secondary | ICD-10-CM | POA: Diagnosis present

## 2018-07-15 DIAGNOSIS — M48061 Spinal stenosis, lumbar region without neurogenic claudication: Secondary | ICD-10-CM | POA: Diagnosis present

## 2018-07-15 DIAGNOSIS — E11618 Type 2 diabetes mellitus with other diabetic arthropathy: Secondary | ICD-10-CM | POA: Diagnosis not present

## 2018-07-15 DIAGNOSIS — I2510 Atherosclerotic heart disease of native coronary artery without angina pectoris: Secondary | ICD-10-CM | POA: Diagnosis present

## 2018-07-15 DIAGNOSIS — I110 Hypertensive heart disease with heart failure: Secondary | ICD-10-CM | POA: Diagnosis not present

## 2018-07-15 DIAGNOSIS — M797 Fibromyalgia: Secondary | ICD-10-CM | POA: Diagnosis present

## 2018-07-15 DIAGNOSIS — G4700 Insomnia, unspecified: Secondary | ICD-10-CM | POA: Diagnosis present

## 2018-07-15 DIAGNOSIS — Z803 Family history of malignant neoplasm of breast: Secondary | ICD-10-CM | POA: Diagnosis not present

## 2018-07-15 DIAGNOSIS — R5381 Other malaise: Secondary | ICD-10-CM | POA: Diagnosis not present

## 2018-07-15 DIAGNOSIS — Z89511 Acquired absence of right leg below knee: Secondary | ICD-10-CM | POA: Diagnosis not present

## 2018-07-15 DIAGNOSIS — I739 Peripheral vascular disease, unspecified: Secondary | ICD-10-CM | POA: Diagnosis not present

## 2018-07-15 DIAGNOSIS — E785 Hyperlipidemia, unspecified: Secondary | ICD-10-CM | POA: Diagnosis present

## 2018-07-15 DIAGNOSIS — R7611 Nonspecific reaction to tuberculin skin test without active tuberculosis: Secondary | ICD-10-CM | POA: Diagnosis present

## 2018-07-15 DIAGNOSIS — M47899 Other spondylosis, site unspecified: Secondary | ICD-10-CM | POA: Diagnosis present

## 2018-07-15 DIAGNOSIS — F339 Major depressive disorder, recurrent, unspecified: Secondary | ICD-10-CM | POA: Diagnosis not present

## 2018-07-15 DIAGNOSIS — B1920 Unspecified viral hepatitis C without hepatic coma: Secondary | ICD-10-CM | POA: Diagnosis present

## 2018-07-15 DIAGNOSIS — E669 Obesity, unspecified: Secondary | ICD-10-CM

## 2018-07-15 DIAGNOSIS — Z4789 Encounter for other orthopedic aftercare: Secondary | ICD-10-CM | POA: Diagnosis not present

## 2018-07-15 DIAGNOSIS — Z981 Arthrodesis status: Secondary | ICD-10-CM | POA: Diagnosis not present

## 2018-07-15 DIAGNOSIS — I5023 Acute on chronic systolic (congestive) heart failure: Secondary | ICD-10-CM | POA: Diagnosis not present

## 2018-07-15 DIAGNOSIS — R6889 Other general symptoms and signs: Secondary | ICD-10-CM | POA: Diagnosis not present

## 2018-07-15 DIAGNOSIS — Z833 Family history of diabetes mellitus: Secondary | ICD-10-CM | POA: Diagnosis not present

## 2018-07-15 DIAGNOSIS — F419 Anxiety disorder, unspecified: Secondary | ICD-10-CM | POA: Diagnosis not present

## 2018-07-15 DIAGNOSIS — M4726 Other spondylosis with radiculopathy, lumbar region: Secondary | ICD-10-CM | POA: Diagnosis present

## 2018-07-15 DIAGNOSIS — I10 Essential (primary) hypertension: Secondary | ICD-10-CM | POA: Diagnosis present

## 2018-07-15 DIAGNOSIS — E1151 Type 2 diabetes mellitus with diabetic peripheral angiopathy without gangrene: Secondary | ICD-10-CM | POA: Diagnosis present

## 2018-07-15 DIAGNOSIS — G4733 Obstructive sleep apnea (adult) (pediatric): Secondary | ICD-10-CM | POA: Diagnosis present

## 2018-07-15 DIAGNOSIS — F418 Other specified anxiety disorders: Secondary | ICD-10-CM | POA: Diagnosis present

## 2018-07-15 DIAGNOSIS — I252 Old myocardial infarction: Secondary | ICD-10-CM | POA: Diagnosis not present

## 2018-07-15 DIAGNOSIS — M4802 Spinal stenosis, cervical region: Secondary | ICD-10-CM | POA: Diagnosis present

## 2018-07-15 DIAGNOSIS — I509 Heart failure, unspecified: Secondary | ICD-10-CM | POA: Diagnosis not present

## 2018-07-15 DIAGNOSIS — Z8349 Family history of other endocrine, nutritional and metabolic diseases: Secondary | ICD-10-CM | POA: Diagnosis not present

## 2018-07-15 DIAGNOSIS — Z818 Family history of other mental and behavioral disorders: Secondary | ICD-10-CM | POA: Diagnosis not present

## 2018-07-15 LAB — COMPREHENSIVE METABOLIC PANEL
ALT: 25 U/L (ref 0–44)
AST: 28 U/L (ref 15–41)
Albumin: 4.1 g/dL (ref 3.5–5.0)
Alkaline Phosphatase: 77 U/L (ref 38–126)
Anion gap: 13 (ref 5–15)
BUN: 23 mg/dL (ref 8–23)
CO2: 22 mmol/L (ref 22–32)
Calcium: 9.3 mg/dL (ref 8.9–10.3)
Chloride: 102 mmol/L (ref 98–111)
Creatinine, Ser: 1.39 mg/dL — ABNORMAL HIGH (ref 0.61–1.24)
GFR calc non Af Amer: 52 mL/min — ABNORMAL LOW (ref >60)
Glucose, Bld: 135 mg/dL — ABNORMAL HIGH (ref 70–99)
Potassium: 3.9 mmol/L (ref 3.5–5.1)
Sodium: 137 mmol/L (ref 135–145)
Total Bilirubin: 0.4 mg/dL (ref 0.3–1.2)
Total Protein: 7.8 g/dL (ref 6.5–8.1)

## 2018-07-15 LAB — APTT: APTT: 32 s (ref 24–36)

## 2018-07-15 LAB — URINALYSIS, ROUTINE W REFLEX MICROSCOPIC
Bilirubin Urine: NEGATIVE
Glucose, UA: NEGATIVE mg/dL
Hgb urine dipstick: NEGATIVE
KETONES UR: 15 mg/dL — AB
Leukocytes,Ua: NEGATIVE
Nitrite: NEGATIVE
PROTEIN: NEGATIVE mg/dL
Specific Gravity, Urine: 1.025 (ref 1.005–1.030)
pH: 5.5 (ref 5.0–8.0)

## 2018-07-15 LAB — SURGICAL PCR SCREEN
MRSA, PCR: NEGATIVE
Staphylococcus aureus: NEGATIVE

## 2018-07-15 LAB — CBC
HCT: 41.8 % (ref 39.0–52.0)
Hemoglobin: 13.1 g/dL (ref 13.0–17.0)
MCH: 27 pg (ref 26.0–34.0)
MCHC: 31.3 g/dL (ref 30.0–36.0)
MCV: 86 fL (ref 80.0–100.0)
Platelets: 258 10*3/uL (ref 150–400)
RBC: 4.86 MIL/uL (ref 4.22–5.81)
RDW: 13.8 % (ref 11.5–15.5)
WBC: 6.4 10*3/uL (ref 4.0–10.5)
nRBC: 0 % (ref 0.0–0.2)

## 2018-07-15 LAB — GLUCOSE, CAPILLARY: Glucose-Capillary: 121 mg/dL — ABNORMAL HIGH (ref 70–99)

## 2018-07-15 LAB — PROTIME-INR
INR: 1.2
Prothrombin Time: 14.7 seconds (ref 11.4–15.2)

## 2018-07-15 MED ORDER — DEXTROSE 5 % IV SOLN
3.0000 g | INTRAVENOUS | Status: AC
  Administered 2018-07-16: 3 g via INTRAVENOUS
  Filled 2018-07-15: qty 3

## 2018-07-15 NOTE — Progress Notes (Signed)
Sleeve Assessment: 6th  SWL Appointment.   According to pts referral the pt needs 6 SWL with 2 assessments. Pt state he works with a professional for depression talk therapy and medication management.  Pt does have right below the knee amputation.  Pt states he has a goal of 2000 calories. Pt states working with his phycologist has been helpful. Pt states he has been working on hunger verses appetite.Pt does get meals on wheels.  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 Pt states to control his emotional eating he walk to the steps or bedside exercises.   Pt arrives stating he has back pain and is getting surgery tomorrow. Pt states he has to put his dog down today. Pt states due to his phycology appts he will not have surgery until the summer.  Pt states he feels he has been successful with eating nutritional meals. Pt states he feels he may struggle with not being able to eat pizza for a while.   Pt states he is going to continue to have 3 meals a day and cut back on portions of food ans try to lose weight prior to surgery. Pt states his girlfriend will help him to stay motivated to continue to make changes.    Start weight at [facility]: 317.8 (this weight is with his prosthesis) Weight: 328.6 (with prosthesis) BMI: 43.35  MEDICATIONS: see list   DIETARY INTAKE:  24 hr Dietary Recall: 3 meals a day eating every 3-5 hours First Meal: 2 bacon and 1 eggs and 2 pieces of toast  Snack: milk and kellog bar Second Meal: meals on wheels  Third Meal: 5 dollar pizza from [employer] or hamburger or hotdog or chicken with man n cheese and asparagus  Snack 9pm: popcorn Beverages: 4 liters a day of water, 1% milk  Usual physical activity: ADL's due to pain  Diet to Follow: 2000 calories 225 g carbohydrates 150 g protein 56 g fat   Nutritional Diagnosis:  [place]-3.3 Overweight/obesity related to past poor dietary habits and physical inactivity as evidenced by patient w/ planned Sleeve surgery following  dietary guidelines for continued weight loss.    Intervention:  Nutrition counseling for upcoming Bariatric Surgery. Goals: -Encouraged to engage in 150 minutes of moderate physical activity including cardiovascular and weight baring weekly -Work on keeping fat in the single digits -Focus on healing after your hip surgery with at least 4-5 meals/snacks a day and 80-100 fluid ounces  -Continue to focus on your portion sizes   Teaching Method Utilized:  Visual Auditory Hands on  Handouts given during visit include:  Pre-op goals  Barriers to learning/adherence to lifestyle change: emotional eating  Demonstrated degree of understanding via:  Teach Back   Monitoring/Evaluation:  Dietary intake, exercise,and body weight prn.

## 2018-07-15 NOTE — Anesthesia Preprocedure Evaluation (Addendum)
Anesthesia Evaluation  Patient identified by MRN, date of birth, ID band Patient awake    Reviewed: Allergy & Precautions, NPO status , Patient's Chart, lab work & pertinent test results  Airway Mallampati: III  TM Distance: >3 FB Neck ROM: Full    Dental  (+) Missing, Poor Dentition, Chipped   Pulmonary sleep apnea , former smoker,    Pulmonary exam normal breath sounds clear to auscultation       Cardiovascular hypertension, Pt. on medications + CAD, + Past MI, + Cardiac Stents (x 5 in 2007) and + Peripheral Vascular Disease  Normal cardiovascular exam Rhythm:Regular Rate:Normal  ECG: NSR, rate 64  Myocardial perfusion Nuclear stress EF: 47%. There was no ST segment deviation noted during stress. No T wave inversion was noted during stress. This is an intermediate risk study due to reduced systolic function. There is no ischemia. The left ventricular ejection fraction is mildly decreased (45-54%).  ECHO: LV EF: 55% - 60%   Neuro/Psych  Headaches, PSYCHIATRIC DISORDERS Anxiety Depression  Neuromuscular disease    GI/Hepatic GERD  Medicated and Controlled,(+)     substance abuse  , Hepatitis -, C  Endo/Other  diabetesMorbid obesity  Renal/GU negative Renal ROS     Musculoskeletal  (+) Arthritis , Fibromyalgia -, narcotic dependentChronic pain syndrome   Abdominal (+) + obese,   Peds  Hematology HLD   Anesthesia Other Findings recurrent spinal stenosis L4-5 with left L4 foraminal stenosis  Reproductive/Obstetrics                          Anesthesia Physical Anesthesia Plan  ASA: III  Anesthesia Plan: General   Post-op Pain Management:    Induction: Intravenous  PONV Risk Score and Plan: 3 and Midazolam, Dexamethasone, Ondansetron and Treatment may vary due to age or medical condition  Airway Management Planned: Oral ETT  Additional Equipment:   Intra-op Plan:    Post-operative Plan: Extubation in OR  Informed Consent: I have reviewed the patients History and Physical, chart, labs and discussed the procedure including the risks, benefits and alternatives for the proposed anesthesia with the patient or authorized representative who has indicated his/her understanding and acceptance.     Dental advisory given  Plan Discussed with: CRNA  Anesthesia Plan Comments: (Reviewed previous note by Karoline Caldwell, PA-C 04/11/18. Pt cleared by cardiology for lumbar surgery. Per Dr. Camillia Herter note 07/01/18 "He is overall limited in mobility due to his obesity and back pain. He has no concerning symptoms suggestive of angina, arrhythmias or CHF. Stress test November 2019 with no ischemia. He can proceed with his planned surgical procedure. He can hold his ASA and Effient 5 days before his planned procedure.")       Anesthesia Quick Evaluation

## 2018-07-16 ENCOUNTER — Inpatient Hospital Stay (HOSPITAL_COMMUNITY)
Admission: AD | Admit: 2018-07-16 | Discharge: 2018-07-19 | DRG: 516 | Disposition: A | Discharge status: Skilled Nursing Facility | Payer: Medicare HMO | Attending: Specialist | Admitting: Specialist

## 2018-07-16 ENCOUNTER — Ambulatory Visit (HOSPITAL_COMMUNITY): Payer: Medicare HMO | Admitting: Anesthesiology

## 2018-07-16 ENCOUNTER — Other Ambulatory Visit (HOSPITAL_COMMUNITY): Payer: Self-pay | Admitting: Psychiatry

## 2018-07-16 ENCOUNTER — Ambulatory Visit (HOSPITAL_COMMUNITY): Payer: Medicare HMO

## 2018-07-16 ENCOUNTER — Ambulatory Visit (HOSPITAL_COMMUNITY): Payer: Medicare HMO | Admitting: Vascular Surgery

## 2018-07-16 ENCOUNTER — Encounter (HOSPITAL_COMMUNITY): Payer: Self-pay | Admitting: *Deleted

## 2018-07-16 ENCOUNTER — Ambulatory Visit (HOSPITAL_COMMUNITY)
Admission: AD | Disposition: A | Discharge status: Skilled Nursing Facility | Payer: Self-pay | Source: Home / Self Care | Attending: Specialist

## 2018-07-16 DIAGNOSIS — Z86010 Personal history of colonic polyps: Secondary | ICD-10-CM

## 2018-07-16 DIAGNOSIS — Z7901 Long term (current) use of anticoagulants: Secondary | ICD-10-CM

## 2018-07-16 DIAGNOSIS — Z791 Long term (current) use of non-steroidal anti-inflammatories (NSAID): Secondary | ICD-10-CM

## 2018-07-16 DIAGNOSIS — I2510 Atherosclerotic heart disease of native coronary artery without angina pectoris: Secondary | ICD-10-CM | POA: Diagnosis present

## 2018-07-16 DIAGNOSIS — Z8349 Family history of other endocrine, nutritional and metabolic diseases: Secondary | ICD-10-CM

## 2018-07-16 DIAGNOSIS — E785 Hyperlipidemia, unspecified: Secondary | ICD-10-CM | POA: Diagnosis present

## 2018-07-16 DIAGNOSIS — M797 Fibromyalgia: Secondary | ICD-10-CM | POA: Diagnosis present

## 2018-07-16 DIAGNOSIS — M47899 Other spondylosis, site unspecified: Secondary | ICD-10-CM | POA: Diagnosis present

## 2018-07-16 DIAGNOSIS — B1920 Unspecified viral hepatitis C without hepatic coma: Secondary | ICD-10-CM | POA: Diagnosis present

## 2018-07-16 DIAGNOSIS — F319 Bipolar disorder, unspecified: Secondary | ICD-10-CM

## 2018-07-16 DIAGNOSIS — Z89511 Acquired absence of right leg below knee: Secondary | ICD-10-CM

## 2018-07-16 DIAGNOSIS — M4726 Other spondylosis with radiculopathy, lumbar region: Secondary | ICD-10-CM

## 2018-07-16 DIAGNOSIS — E1151 Type 2 diabetes mellitus with diabetic peripheral angiopathy without gangrene: Secondary | ICD-10-CM | POA: Diagnosis present

## 2018-07-16 DIAGNOSIS — Z8042 Family history of malignant neoplasm of prostate: Secondary | ICD-10-CM

## 2018-07-16 DIAGNOSIS — I10 Essential (primary) hypertension: Secondary | ICD-10-CM | POA: Diagnosis present

## 2018-07-16 DIAGNOSIS — Z96652 Presence of left artificial knee joint: Secondary | ICD-10-CM

## 2018-07-16 DIAGNOSIS — Z87891 Personal history of nicotine dependence: Secondary | ICD-10-CM

## 2018-07-16 DIAGNOSIS — Z87442 Personal history of urinary calculi: Secondary | ICD-10-CM

## 2018-07-16 DIAGNOSIS — F411 Generalized anxiety disorder: Secondary | ICD-10-CM

## 2018-07-16 DIAGNOSIS — Z419 Encounter for procedure for purposes other than remedying health state, unspecified: Secondary | ICD-10-CM

## 2018-07-16 DIAGNOSIS — F418 Other specified anxiety disorders: Secondary | ICD-10-CM | POA: Diagnosis present

## 2018-07-16 DIAGNOSIS — I252 Old myocardial infarction: Secondary | ICD-10-CM

## 2018-07-16 DIAGNOSIS — G4700 Insomnia, unspecified: Secondary | ICD-10-CM | POA: Diagnosis present

## 2018-07-16 DIAGNOSIS — K219 Gastro-esophageal reflux disease without esophagitis: Secondary | ICD-10-CM | POA: Diagnosis present

## 2018-07-16 DIAGNOSIS — Z6841 Body Mass Index (BMI) 40.0 and over, adult: Secondary | ICD-10-CM

## 2018-07-16 DIAGNOSIS — Z8249 Family history of ischemic heart disease and other diseases of the circulatory system: Secondary | ICD-10-CM

## 2018-07-16 DIAGNOSIS — Z79891 Long term (current) use of opiate analgesic: Secondary | ICD-10-CM

## 2018-07-16 DIAGNOSIS — M48061 Spinal stenosis, lumbar region without neurogenic claudication: Principal | ICD-10-CM | POA: Diagnosis present

## 2018-07-16 DIAGNOSIS — Z79899 Other long term (current) drug therapy: Secondary | ICD-10-CM

## 2018-07-16 DIAGNOSIS — G4733 Obstructive sleep apnea (adult) (pediatric): Secondary | ICD-10-CM | POA: Diagnosis present

## 2018-07-16 DIAGNOSIS — Z818 Family history of other mental and behavioral disorders: Secondary | ICD-10-CM

## 2018-07-16 DIAGNOSIS — Z7982 Long term (current) use of aspirin: Secondary | ICD-10-CM

## 2018-07-16 DIAGNOSIS — I110 Hypertensive heart disease with heart failure: Secondary | ICD-10-CM | POA: Diagnosis not present

## 2018-07-16 DIAGNOSIS — G894 Chronic pain syndrome: Secondary | ICD-10-CM

## 2018-07-16 DIAGNOSIS — M5430 Sciatica, unspecified side: Secondary | ICD-10-CM | POA: Diagnosis present

## 2018-07-16 DIAGNOSIS — M4802 Spinal stenosis, cervical region: Secondary | ICD-10-CM | POA: Diagnosis present

## 2018-07-16 DIAGNOSIS — R7611 Nonspecific reaction to tuberculin skin test without active tuberculosis: Secondary | ICD-10-CM | POA: Diagnosis present

## 2018-07-16 DIAGNOSIS — Z808 Family history of malignant neoplasm of other organs or systems: Secondary | ICD-10-CM

## 2018-07-16 DIAGNOSIS — Z833 Family history of diabetes mellitus: Secondary | ICD-10-CM

## 2018-07-16 DIAGNOSIS — Z9889 Other specified postprocedural states: Secondary | ICD-10-CM

## 2018-07-16 DIAGNOSIS — I509 Heart failure, unspecified: Secondary | ICD-10-CM | POA: Diagnosis not present

## 2018-07-16 DIAGNOSIS — Z803 Family history of malignant neoplasm of breast: Secondary | ICD-10-CM

## 2018-07-16 HISTORY — PX: LUMBAR LAMINECTOMY: SHX95

## 2018-07-16 LAB — GLUCOSE, CAPILLARY
GLUCOSE-CAPILLARY: 149 mg/dL — AB (ref 70–99)
Glucose-Capillary: 116 mg/dL — ABNORMAL HIGH (ref 70–99)

## 2018-07-16 SURGERY — MICRODISCECTOMY LUMBAR LAMINECTOMY
Anesthesia: General | Site: Spine Lumbar

## 2018-07-16 MED ORDER — PHENOL 1.4 % MT LIQD: 1.0000 | OROMUCOSAL | Status: DC | PRN

## 2018-07-16 MED ORDER — PROPOFOL 10 MG/ML IV BOLUS
INTRAVENOUS | Status: AC
  Filled 2018-07-16: qty 40

## 2018-07-16 MED ORDER — METHOCARBAMOL 1000 MG/10ML IJ SOLN
500.0000 mg | Freq: Four times a day (QID) | INTRAVENOUS | Status: DC | PRN
  Filled 2018-07-16: qty 5

## 2018-07-16 MED ORDER — MENTHOL 3 MG MT LOZG
1.0000 | LOZENGE | OROMUCOSAL | Status: DC | PRN
  Administered 2018-07-17: 3 mg via ORAL
  Filled 2018-07-16: qty 9

## 2018-07-16 MED ORDER — HYDROMORPHONE HCL 1 MG/ML IJ SOLN: 0.2500 mg | INTRAMUSCULAR | Status: DC | PRN

## 2018-07-16 MED ORDER — ONDANSETRON HCL 4 MG/2ML IJ SOLN: 4.0000 mg | Freq: Four times a day (QID) | INTRAMUSCULAR | Status: DC | PRN

## 2018-07-16 MED ORDER — ALBUMIN HUMAN 5 % IV SOLN
INTRAVENOUS | Status: DC | PRN
  Administered 2018-07-16 (×2): via INTRAVENOUS

## 2018-07-16 MED ORDER — THROMBIN (RECOMBINANT) 20000 UNITS EX SOLR
CUTANEOUS | Status: AC
  Filled 2018-07-16: qty 20000

## 2018-07-16 MED ORDER — HYDROCODONE-ACETAMINOPHEN 10-325 MG PO TABS
1.0000 | ORAL_TABLET | ORAL | Status: DC | PRN
  Administered 2018-07-16 – 2018-07-19 (×2): 1 via ORAL
  Filled 2018-07-16 (×2): qty 1

## 2018-07-16 MED ORDER — METHOCARBAMOL 500 MG PO TABS
500.0000 mg | ORAL_TABLET | Freq: Four times a day (QID) | ORAL | Status: DC | PRN
  Administered 2018-07-17 – 2018-07-19 (×4): 500 mg via ORAL
  Filled 2018-07-16 (×4): qty 1

## 2018-07-16 MED ORDER — ONDANSETRON HCL 4 MG/2ML IJ SOLN
INTRAMUSCULAR | Status: DC | PRN
  Administered 2018-07-16: 4 mg via INTRAVENOUS

## 2018-07-16 MED ORDER — PHENYLEPHRINE 40 MCG/ML (10ML) SYRINGE FOR IV PUSH (FOR BLOOD PRESSURE SUPPORT)
PREFILLED_SYRINGE | INTRAVENOUS | Status: AC
  Filled 2018-07-16: qty 10

## 2018-07-16 MED ORDER — MORPHINE SULFATE (PF) 2 MG/ML IV SOLN: 1.0000 mg | INTRAVENOUS | Status: DC | PRN

## 2018-07-16 MED ORDER — 0.9 % SODIUM CHLORIDE (POUR BTL) OPTIME
TOPICAL | Status: DC | PRN
  Administered 2018-07-16: 850 mL
  Administered 2018-07-16: 1000 mL

## 2018-07-16 MED ORDER — CYCLOBENZAPRINE HCL 10 MG PO TABS
5.0000 mg | ORAL_TABLET | Freq: Three times a day (TID) | ORAL | Status: DC | PRN
  Administered 2018-07-16 – 2018-07-19 (×2): 5 mg via ORAL
  Filled 2018-07-16 (×2): qty 1

## 2018-07-16 MED ORDER — OXYCODONE HCL 5 MG PO TABS
10.0000 mg | ORAL_TABLET | ORAL | Status: DC | PRN
  Administered 2018-07-17 – 2018-07-18 (×2): 10 mg via ORAL
  Filled 2018-07-16 (×2): qty 2

## 2018-07-16 MED ORDER — ROCURONIUM BROMIDE 50 MG/5ML IV SOSY
PREFILLED_SYRINGE | INTRAVENOUS | Status: DC | PRN
  Administered 2018-07-16: 10 mg via INTRAVENOUS
  Administered 2018-07-16: 50 mg via INTRAVENOUS
  Administered 2018-07-16: 20 mg via INTRAVENOUS

## 2018-07-16 MED ORDER — DOCUSATE SODIUM 100 MG PO CAPS
100.0000 mg | ORAL_CAPSULE | Freq: Two times a day (BID) | ORAL | Status: DC
  Administered 2018-07-16 – 2018-07-19 (×5): 100 mg via ORAL
  Filled 2018-07-16 (×8): qty 1

## 2018-07-16 MED ORDER — EPHEDRINE SULFATE 50 MG/ML IJ SOLN
INTRAMUSCULAR | Status: DC | PRN
  Administered 2018-07-16: 15 mg via INTRAVENOUS

## 2018-07-16 MED ORDER — PANTOPRAZOLE SODIUM 40 MG PO TBEC
40.0000 mg | DELAYED_RELEASE_TABLET | Freq: Every day | ORAL | Status: DC
  Administered 2018-07-16 – 2018-07-18 (×3): 40 mg via ORAL
  Filled 2018-07-16 (×3): qty 1

## 2018-07-16 MED ORDER — BENZTROPINE MESYLATE 0.5 MG PO TABS
0.5000 mg | ORAL_TABLET | Freq: Every day | ORAL | Status: DC
  Administered 2018-07-16 – 2018-07-18 (×3): 0.5 mg via ORAL
  Filled 2018-07-16 (×3): qty 1

## 2018-07-16 MED ORDER — FENTANYL CITRATE (PF) 100 MCG/2ML IJ SOLN
INTRAMUSCULAR | Status: DC | PRN
  Administered 2018-07-16 (×5): 50 ug via INTRAVENOUS

## 2018-07-16 MED ORDER — MELATONIN 3 MG PO TABS
18.0000 mg | ORAL_TABLET | Freq: Every day | ORAL | Status: DC
  Administered 2018-07-16 – 2018-07-18 (×3): 18 mg via ORAL
  Filled 2018-07-16 (×3): qty 6

## 2018-07-16 MED ORDER — PHENYLEPHRINE 40 MCG/ML (10ML) SYRINGE FOR IV PUSH (FOR BLOOD PRESSURE SUPPORT)
PREFILLED_SYRINGE | INTRAVENOUS | Status: DC | PRN
  Administered 2018-07-16: 200 ug via INTRAVENOUS

## 2018-07-16 MED ORDER — ONDANSETRON HCL 4 MG/2ML IJ SOLN
INTRAMUSCULAR | Status: AC
  Filled 2018-07-16: qty 2

## 2018-07-16 MED ORDER — ASPIRIN EC 81 MG PO TBEC
81.0000 mg | DELAYED_RELEASE_TABLET | Freq: Every day | ORAL | Status: DC
  Administered 2018-07-16 – 2018-07-18 (×3): 81 mg via ORAL
  Filled 2018-07-16 (×3): qty 1

## 2018-07-16 MED ORDER — ONDANSETRON HCL 4 MG PO TABS: 4.0000 mg | ORAL_TABLET | Freq: Four times a day (QID) | ORAL | Status: DC | PRN

## 2018-07-16 MED ORDER — HYDROCODONE-ACETAMINOPHEN 7.5-325 MG PO TABS
1.0000 | ORAL_TABLET | Freq: Four times a day (QID) | ORAL | Status: DC
  Administered 2018-07-16 – 2018-07-19 (×13): 1 via ORAL
  Filled 2018-07-16 (×13): qty 1

## 2018-07-16 MED ORDER — DEXAMETHASONE SODIUM PHOSPHATE 10 MG/ML IJ SOLN
INTRAMUSCULAR | Status: AC
  Filled 2018-07-16: qty 1

## 2018-07-16 MED ORDER — SODIUM CHLORIDE 0.9 % IV SOLN
INTRAVENOUS | Status: DC | PRN
  Administered 2018-07-16: 40 ug/min via INTRAVENOUS

## 2018-07-16 MED ORDER — FENTANYL CITRATE (PF) 250 MCG/5ML IJ SOLN
INTRAMUSCULAR | Status: AC
  Filled 2018-07-16: qty 5

## 2018-07-16 MED ORDER — SODIUM CHLORIDE 0.9 % IV SOLN
INTRAVENOUS | Status: DC
  Administered 2018-07-16: 12:00:00 via INTRAVENOUS

## 2018-07-16 MED ORDER — STERILE WATER FOR IRRIGATION IR SOLN
Status: DC | PRN
  Administered 2018-07-16: 1000 mL

## 2018-07-16 MED ORDER — ALUM & MAG HYDROXIDE-SIMETH 200-200-20 MG/5ML PO SUSP: 30.0000 mL | Freq: Four times a day (QID) | ORAL | Status: DC | PRN

## 2018-07-16 MED ORDER — ROCURONIUM BROMIDE 50 MG/5ML IV SOSY
PREFILLED_SYRINGE | INTRAVENOUS | Status: AC
  Filled 2018-07-16: qty 5

## 2018-07-16 MED ORDER — BUPIVACAINE HCL 0.5 % IJ SOLN
INTRAMUSCULAR | Status: DC | PRN
  Administered 2018-07-16: 5 mL
  Administered 2018-07-16: 10 mL

## 2018-07-16 MED ORDER — LIDOCAINE 2% (20 MG/ML) 5 ML SYRINGE
INTRAMUSCULAR | Status: DC | PRN
  Administered 2018-07-16: 100 mg via INTRAVENOUS

## 2018-07-16 MED ORDER — CHLORHEXIDINE GLUCONATE 4 % EX LIQD: 60.0000 mL | Freq: Once | CUTANEOUS | Status: DC

## 2018-07-16 MED ORDER — MIRABEGRON ER 25 MG PO TB24
50.0000 mg | ORAL_TABLET | Freq: Every day | ORAL | Status: DC
  Administered 2018-07-16 – 2018-07-18 (×3): 50 mg via ORAL
  Filled 2018-07-16 (×3): qty 2

## 2018-07-16 MED ORDER — SUCCINYLCHOLINE CHLORIDE 200 MG/10ML IV SOSY
PREFILLED_SYRINGE | INTRAVENOUS | Status: AC
  Filled 2018-07-16: qty 10

## 2018-07-16 MED ORDER — SODIUM CHLORIDE 0.9% FLUSH: 3.0000 mL | INTRAVENOUS | Status: DC | PRN

## 2018-07-16 MED ORDER — EPHEDRINE 5 MG/ML INJ
INTRAVENOUS | Status: AC
  Filled 2018-07-16: qty 10

## 2018-07-16 MED ORDER — KETOROLAC TROMETHAMINE 30 MG/ML IJ SOLN: 15.0000 mg | Freq: Once | INTRAMUSCULAR | Status: DC | PRN

## 2018-07-16 MED ORDER — OXYCODONE HCL 5 MG PO TABS: 5.0000 mg | ORAL_TABLET | Freq: Once | ORAL | Status: DC | PRN

## 2018-07-16 MED ORDER — SUCCINYLCHOLINE CHLORIDE 20 MG/ML IJ SOLN
INTRAMUSCULAR | Status: DC | PRN
  Administered 2018-07-16: 140 mg via INTRAVENOUS

## 2018-07-16 MED ORDER — DULOXETINE HCL 30 MG PO CPEP
30.0000 mg | ORAL_CAPSULE | Freq: Two times a day (BID) | ORAL | Status: DC
  Administered 2018-07-16 – 2018-07-19 (×7): 30 mg via ORAL
  Filled 2018-07-16 (×7): qty 1

## 2018-07-16 MED ORDER — FLEET ENEMA 7-19 GM/118ML RE ENEM: 1.0000 | ENEMA | Freq: Once | RECTAL | Status: DC | PRN

## 2018-07-16 MED ORDER — ACETAMINOPHEN 650 MG RE SUPP: 650.0000 mg | RECTAL | Status: DC | PRN

## 2018-07-16 MED ORDER — MIDAZOLAM HCL 5 MG/5ML IJ SOLN
INTRAMUSCULAR | Status: DC | PRN
  Administered 2018-07-16: 2 mg via INTRAVENOUS

## 2018-07-16 MED ORDER — MIDAZOLAM HCL 2 MG/2ML IJ SOLN
INTRAMUSCULAR | Status: AC
  Filled 2018-07-16: qty 2

## 2018-07-16 MED ORDER — GABAPENTIN 400 MG PO CAPS
800.0000 mg | ORAL_CAPSULE | Freq: Three times a day (TID) | ORAL | Status: DC
  Administered 2018-07-16 – 2018-07-18 (×8): 800 mg via ORAL
  Filled 2018-07-16 (×8): qty 2

## 2018-07-16 MED ORDER — ARIPIPRAZOLE 5 MG PO TABS
5.0000 mg | ORAL_TABLET | Freq: Every day | ORAL | Status: DC
  Administered 2018-07-16 – 2018-07-18 (×3): 5 mg via ORAL
  Filled 2018-07-16 (×3): qty 1

## 2018-07-16 MED ORDER — PROMETHAZINE HCL 25 MG/ML IJ SOLN: 6.2500 mg | INTRAMUSCULAR | Status: DC | PRN

## 2018-07-16 MED ORDER — SUGAMMADEX SODIUM 500 MG/5ML IV SOLN
INTRAVENOUS | Status: DC | PRN
  Administered 2018-07-16: 300 mg via INTRAVENOUS

## 2018-07-16 MED ORDER — BISACODYL 5 MG PO TBEC: 5.0000 mg | DELAYED_RELEASE_TABLET | Freq: Every day | ORAL | Status: DC | PRN

## 2018-07-16 MED ORDER — SODIUM CHLORIDE 0.9 % IV SOLN: 250.0000 mL | INTRAVENOUS | Status: DC

## 2018-07-16 MED ORDER — PROPOFOL 10 MG/ML IV BOLUS
INTRAVENOUS | Status: DC | PRN
  Administered 2018-07-16: 200 mg via INTRAVENOUS

## 2018-07-16 MED ORDER — ACETAMINOPHEN 500 MG PO TABS
1000.0000 mg | ORAL_TABLET | Freq: Once | ORAL | Status: AC
  Administered 2018-07-16: 1000 mg via ORAL
  Filled 2018-07-16: qty 2

## 2018-07-16 MED ORDER — THROMBIN 20000 UNITS EX KIT
PACK | CUTANEOUS | Status: DC | PRN
  Administered 2018-07-16: 20 mL via TOPICAL

## 2018-07-16 MED ORDER — HYDROCHLOROTHIAZIDE 25 MG PO TABS
25.0000 mg | ORAL_TABLET | Freq: Every day | ORAL | Status: DC
  Administered 2018-07-16 – 2018-07-18 (×3): 25 mg via ORAL
  Filled 2018-07-16 (×3): qty 1

## 2018-07-16 MED ORDER — SUGAMMADEX SODIUM 500 MG/5ML IV SOLN
INTRAVENOUS | Status: AC
  Filled 2018-07-16: qty 5

## 2018-07-16 MED ORDER — CLONAZEPAM 0.5 MG PO TABS
0.5000 mg | ORAL_TABLET | Freq: Every day | ORAL | Status: DC
  Administered 2018-07-16 – 2018-07-18 (×3): 0.5 mg via ORAL
  Filled 2018-07-16 (×3): qty 1

## 2018-07-16 MED ORDER — OXYCODONE HCL 5 MG/5ML PO SOLN: 5.0000 mg | Freq: Once | ORAL | Status: DC | PRN

## 2018-07-16 MED ORDER — BUPIVACAINE LIPOSOME 1.3 % IJ SUSP
20.0000 mL | INTRAMUSCULAR | Status: AC
  Administered 2018-07-16: 10 mL
  Filled 2018-07-16: qty 20

## 2018-07-16 MED ORDER — LACTATED RINGERS IV SOLN
INTRAVENOUS | Status: DC | PRN
  Administered 2018-07-16 (×3): via INTRAVENOUS

## 2018-07-16 MED ORDER — POLYETHYLENE GLYCOL 3350 17 G PO PACK: 17.0000 g | PACK | Freq: Every day | ORAL | Status: DC | PRN

## 2018-07-16 MED ORDER — ATORVASTATIN CALCIUM 10 MG PO TABS
20.0000 mg | ORAL_TABLET | Freq: Every day | ORAL | Status: DC
  Administered 2018-07-16 – 2018-07-18 (×3): 20 mg via ORAL
  Filled 2018-07-16 (×3): qty 2

## 2018-07-16 MED ORDER — BUPIVACAINE HCL (PF) 0.5 % IJ SOLN
INTRAMUSCULAR | Status: AC
  Filled 2018-07-16: qty 30

## 2018-07-16 MED ORDER — ACETAMINOPHEN 325 MG PO TABS: 650.0000 mg | ORAL_TABLET | ORAL | Status: DC | PRN

## 2018-07-16 MED ORDER — TRAZODONE HCL 50 MG PO TABS
150.0000 mg | ORAL_TABLET | Freq: Every day | ORAL | Status: DC
  Administered 2018-07-16 – 2018-07-18 (×3): 150 mg via ORAL
  Filled 2018-07-16 (×3): qty 1

## 2018-07-16 MED ORDER — CEFAZOLIN SODIUM-DEXTROSE 2-4 GM/100ML-% IV SOLN
2.0000 g | Freq: Three times a day (TID) | INTRAVENOUS | Status: AC
  Administered 2018-07-16 (×2): 2 g via INTRAVENOUS
  Filled 2018-07-16 (×2): qty 100

## 2018-07-16 MED ORDER — LIDOCAINE 2% (20 MG/ML) 5 ML SYRINGE
INTRAMUSCULAR | Status: AC
  Filled 2018-07-16: qty 5

## 2018-07-16 MED ORDER — SODIUM CHLORIDE 0.9% FLUSH
3.0000 mL | Freq: Two times a day (BID) | INTRAVENOUS | Status: DC
  Administered 2018-07-16 – 2018-07-19 (×6): 3 mL via INTRAVENOUS

## 2018-07-16 MED ORDER — GABAPENTIN 800 MG PO TABS
800.0000 mg | ORAL_TABLET | Freq: Three times a day (TID) | ORAL | Status: DC
  Filled 2018-07-16: qty 1

## 2018-07-16 MED ORDER — BUPIVACAINE LIPOSOME 1.3 % IJ SUSP
INTRAMUSCULAR | Status: DC | PRN
  Administered 2018-07-16: 5 mL

## 2018-07-16 SURGICAL SUPPLY — 58 items
ADH SKN CLS LQ APL DERMABOND (GAUZE/BANDAGES/DRESSINGS) ×1
APL SKNCLS STERI-STRIP NONHPOA (GAUZE/BANDAGES/DRESSINGS) ×1
BENZOIN TINCTURE PRP APPL 2/3 (GAUZE/BANDAGES/DRESSINGS) ×2 IMPLANT
BUR SABER RD CUTTING 3.0 (BURR) ×2 IMPLANT
CANISTER SUCT 3000ML PPV (MISCELLANEOUS) ×2 IMPLANT
COVER MAYO STAND STRL (DRAPES) ×2 IMPLANT
COVER SURGICAL LIGHT HANDLE (MISCELLANEOUS) ×2 IMPLANT
COVER WAND RF STERILE (DRAPES) ×2 IMPLANT
DERMABOND ADHESIVE PROPEN (GAUZE/BANDAGES/DRESSINGS) ×1
DERMABOND ADVANCED .7 DNX6 (GAUZE/BANDAGES/DRESSINGS) IMPLANT
DRAPE C-ARM 42X72 X-RAY (DRAPES) IMPLANT
DRAPE HALF SHEET 40X57 (DRAPES) IMPLANT
DRAPE MICROSCOPE LEICA (MISCELLANEOUS) ×2 IMPLANT
DRAPE SURG 17X23 STRL (DRAPES) ×8 IMPLANT
DRSG MEPILEX BORDER 4X4 (GAUZE/BANDAGES/DRESSINGS) IMPLANT
DRSG MEPILEX BORDER 4X8 (GAUZE/BANDAGES/DRESSINGS) ×1 IMPLANT
DURAPREP 26ML APPLICATOR (WOUND CARE) ×2 IMPLANT
ELECT BLADE 4.0 EZ CLEAN MEGAD (MISCELLANEOUS) ×2
ELECT CAUTERY BLADE 6.4 (BLADE) ×2 IMPLANT
ELECT REM PT RETURN 9FT ADLT (ELECTROSURGICAL) ×2
ELECTRODE BLDE 4.0 EZ CLN MEGD (MISCELLANEOUS) ×1 IMPLANT
ELECTRODE REM PT RTRN 9FT ADLT (ELECTROSURGICAL) ×1 IMPLANT
GLOVE BIOGEL PI IND STRL 7.0 (GLOVE) IMPLANT
GLOVE BIOGEL PI IND STRL 8 (GLOVE) ×1 IMPLANT
GLOVE BIOGEL PI INDICATOR 7.0 (GLOVE) ×2
GLOVE BIOGEL PI INDICATOR 8 (GLOVE) ×1
GLOVE ECLIPSE 8.5 STRL (GLOVE) ×2 IMPLANT
GLOVE ORTHO TXT STRL SZ7.5 (GLOVE) ×2 IMPLANT
GLOVE SURG 8.5 LATEX PF (GLOVE) ×2 IMPLANT
GLOVE SURG SS PI 6.5 STRL IVOR (GLOVE) ×1 IMPLANT
GOWN STRL REUS W/ TWL LRG LVL3 (GOWN DISPOSABLE) ×1 IMPLANT
GOWN STRL REUS W/TWL 2XL LVL3 (GOWN DISPOSABLE) ×4 IMPLANT
GOWN STRL REUS W/TWL LRG LVL3 (GOWN DISPOSABLE) ×2
KIT BASIN OR (CUSTOM PROCEDURE TRAY) ×2 IMPLANT
KIT TURNOVER KIT B (KITS) ×2 IMPLANT
NDL SPNL 18GX3.5 QUINCKE PK (NEEDLE) ×2 IMPLANT
NEEDLE SPNL 18GX3.5 QUINCKE PK (NEEDLE) ×4 IMPLANT
NS IRRIG 1000ML POUR BTL (IV SOLUTION) ×2 IMPLANT
PACK LAMINECTOMY ORTHO (CUSTOM PROCEDURE TRAY) ×2 IMPLANT
PAD ARMBOARD 7.5X6 YLW CONV (MISCELLANEOUS) ×6 IMPLANT
PATTIES SURGICAL .5 X.5 (GAUZE/BANDAGES/DRESSINGS) IMPLANT
PATTIES SURGICAL .75X.75 (GAUZE/BANDAGES/DRESSINGS) IMPLANT
SPONGE LAP 4X18 RFD (DISPOSABLE) IMPLANT
SPONGE SURGIFOAM ABS GEL 100 (HEMOSTASIS) ×2 IMPLANT
STRIP CLOSURE SKIN 1/2X4 (GAUZE/BANDAGES/DRESSINGS) ×2 IMPLANT
SUT VIC AB 1 CTX 27 (SUTURE) ×1 IMPLANT
SUT VIC AB 1 CTX 36 (SUTURE) ×2
SUT VIC AB 1 CTX36XBRD ANBCTR (SUTURE) ×1 IMPLANT
SUT VIC AB 2-0 CT1 27 (SUTURE) ×2
SUT VIC AB 2-0 CT1 TAPERPNT 27 (SUTURE) ×1 IMPLANT
SUT VIC AB 3-0 X1 27 (SUTURE) ×2 IMPLANT
SUT VICRYL 0 UR6 27IN ABS (SUTURE) ×2 IMPLANT
SYR 20CC LL (SYRINGE) ×2 IMPLANT
SYR CONTROL 10ML LL (SYRINGE) ×2 IMPLANT
TOWEL OR 17X24 6PK STRL BLUE (TOWEL DISPOSABLE) ×2 IMPLANT
TOWEL OR 17X26 10 PK STRL BLUE (TOWEL DISPOSABLE) ×2 IMPLANT
TRAY FOLEY MTR SLVR 16FR STAT (SET/KITS/TRAYS/PACK) IMPLANT
WATER STERILE IRR 1000ML POUR (IV SOLUTION) ×2 IMPLANT

## 2018-07-16 NOTE — Anesthesia Procedure Notes (Signed)
Procedure Name: Intubation Date/Time: 07/16/2018 7:50 AM Performed by: Scheryl Darter, CRNA Pre-anesthesia Checklist: Patient identified, Emergency Drugs available, Suction available and Patient being monitored Patient Re-evaluated:Patient Re-evaluated prior to induction Oxygen Delivery Method: Circle System Utilized Preoxygenation: Pre-oxygenation with 100% oxygen Induction Type: IV induction Ventilation: Mask ventilation without difficulty Tube type: Oral Tube size: 7.5 mm Number of attempts: 1 Airway Equipment and Method: Stylet Placement Confirmation: ETT inserted through vocal cords under direct vision,  positive ETCO2 and breath sounds checked- equal and bilateral Secured at: 23 cm Tube secured with: Tape Dental Injury: Teeth and Oropharynx as per pre-operative assessment

## 2018-07-16 NOTE — Op Note (Signed)
07/16/2018  10:28 AM  PATIENT:  Travis LORUSSO Sr.  68 y.o. male  MRN: [id number]  OPERATIVE REPORT  PRE-OPERATIVE DIAGNOSIS:  recurrent spinal stenosis L4-5 with left L4 foraminal stenosis  POST-OPERATIVE DIAGNOSIS:  recurrent spinal stenosis L4-5 with left L4 foraminal stenosis  PROCEDURE:  Procedure(s): LEFT L4-5 REDO PARTIAL LUMBAR HEMILAMINECTOMY WITH FORAMINOTOMY LEFT L4    SURGEON:  Jessy Oto, MD     ASSISTANT:  CRNFA  ANESTHESIA:  General,supplemented with local marcaine 0.5% 1:1 exparel 1.3% total 30cc, Dr. Esperanza Richters    COMPLICATIONS:  None.   EBL: 50CC  PROCEDURE:The patient was met in the holding area, and the appropriate left Lumbar level L4-5 identified and marked with "x" and my initials.The patient was then transported to OR and was placed under general anesthesia without difficulty. The patient received appropriate preoperative antibiotic prophylaxis. The patient after intubation atraumatically was transferred to the operating room table, prone position, Jackson spine OR table. All pressure points were well padded. The left arm in 90-90 and the right arm is at side due to frozen left shoulder due to osteoarthritis well-padded at the elbows. Standard prep with first a betadiene scrub and then DuraPrep solution lower dorsal spine to the mid sacral segment. Draped in the usual manner iodine Vi-Drape was used. Time-out procedure was called and correct. The skin incision scar at L4-5 marked and was infiltrated with Marcaine 0.5% 1:1 Exparel 1.3% total of 30 cc used. An incision approximately 3  and a half inches in length was then made through skin and subcutaneous layers ellipsing the old skin incision in line to the left side of the expected midline. An incision made into the left lumbosacral fascia approximately 3 inches in length along the left L3 and L5 spinous processes. The paraspinal muscles elevated from the left side of the spinous processes at L3 and L5 and left  lamina of L3,L4 and L5 Boss McCoullough retractor then placed and a Penfield #4 placed at the inferior aspect of the left L4-5 facet and a clamp over the superior aspect of the spinous process of L5 and a lateral radiograph identified the clamp at the superior L5 spinous process and the Penfield #4 level of the L4-5 disc posterior elements. The operating room microscope sterilely draped brought into the field. Under the operating room microscope, the left L4-5 interspace carefully debrided the scar tissue and small amount of muscle attachment here and curettes used to define the previous laminotomy left central L4-5. A high-speed bur used to drill the medial aspect of the inferior articular process of L4. In order to medialize the laminotomy the left side of the L4 spinous process was thinned using a high speed bur. 2 mm Kerrison then used to enter the spinal canal over the medial aspect of the L4 inferior articular process carefully using the Kerrison to debris the attachment as a curet. Foraminotomy was then performed over the left L5 nerve root. The medial 20% superior articular process of L5 then resected using 2 and 3 mm Kerrisons. This allowed for identification of the thecal sac. Penfield 4 was then used to carefully mobilize the thecal sac medially and the left L5 nerve root identified within the lateral recess flattened over the posterior aspect of the disc and the subarticular area of left L4-5. Carefully the lateral aspect of the L5 nerve root was identified and a Penfield 4 and Barbra Sarks was used to mobilize the nerve medially such that the disc was visible with microscope.  Further foraminotomies was performed over the L4 nerve root the nerve root was noted to be exiting without further compression. The nerve root able to be retracted along the medial aspect of the L5 pedicle and ligamentum flavum was found over the inferior aspect of the left L4 foramen and this was further resected using  Kerrisons. Ligamentum flavum was further debrided superiorly to the level L4-5 disc.Further resection of the L5 lamina inferiorly was performed. With this then the disc space at L4-5 was easily visualized and no herniation was present.  Ligamentum flavum was debrided and lateral recess along the medial aspect L3-4 facet no further decompression was necessary. Ball tip nerve probe was then able to carefully palpate the neuroforamen for L4 and L5 finding these to be well decompressed. Bleeding was then controlled using thrombin-soaked Gelfoam small cottonoids. Small amount of bleeding within the soft tissue mass the laminotomy area was controlled using bipolar electrocautery. Irrigation was carried out using copious amounts of irrigant solution. All Gelfoam were then removed. No significant active bleeding present at the time of removal. All instruments sponge counts were correct traction system was then carefully removed. Lumbodorsal fascia was then carefully approximated with interrupted 0 Vicryl sutures with a UR 6 needle, the deep subcutaneous layers were approximated with interrupted 0 Vicryl sutures on UR 6 the appear subcutaneous layers approximated with interrupted 2-0 Vicryl sutures and the skin closed with a running subcutaneous stitch of 4-0 Vicryl. Dermabond was applied allowed to dry and then Mepilex bandage applied. Patient was then carefully returned to supine position on a stretcher, reactivated and extubated. She was then returned to recovery room in satisfactory condition.  Benjiman Core, PA-C perform the duties of assistant surgeon during this case. He was present from the beginning of the case to the end of the case assisting in transfer the patient from his stretcher to the OR table and back to the stretcher at the end of the case. Assisted in careful retraction and suction of the laminectomy site delicate neural structures operating under the operating room microscope. He also assisted with  closure of the incision from the fascia to the skin applying the dressing.        [name]  07/16/2018, 10:28 AM

## 2018-07-16 NOTE — H&P (Signed)
PREOPERATIVE H&P  Chief Complaint: recurrent spinal stenosis L4-5 with left L4 foraminal stenosis  HPI: Travis [name]. is a 68 y.o. male who presents for preoperative history and physical with a diagnosis of recurrent spinal stenosis L4-5 with left L4 foraminal stenosis. Symptoms are rated as moderate to severe, and have been worsening.  This is significantly impairing activities of daily living.  He has elected for surgical management.   Past Medical History:  Diagnosis Date  . ALLERGIC RHINITIS 06/24/2009  . Anxiety 11/12/2011   Adequate for discharge   . Arthritis    "all my joints" (09/30/2013)  . Arthritis of foot, right, degenerative 04/15/2014  . Balance disorder 03/12/2013  . Benign neoplasm of cecum   . Benign neoplasm of descending colon   . CAD (coronary artery disease) 06/24/2009   5 stents placed in 2007    . Chronic anticoagulation   . Chronic pain syndrome 10/27/2009   of ankle, shoulders, low back.  sciatica.   . Closed fracture of right foot 10/17/2014  . CORONARY ARTERY DISEASE 06/24/2009   a. s/p multiple PCIs - In 2008 he had a Taxus DES to the mild LAD, Endeavor DES to mid LCX and distal LCX. In January 2009 he had DES to distal LCX, mid LCX and proximal LCX. In November 2009 had BMS x 2 to the mid RCA. Cath 10/2011 with patent stents, noncardiac CP. LHC 01/2013: patent stents (noncardiac CP).  . DEGENERATIVE JOINT DISEASE 06/24/2009   Qualifier: Diagnosis of  By: [name], [name]   . Depression   . Depression with anxiety    Prior suicide attempt  . [place] DISEASE, LUMBAR 04/19/2010  . ERECTILE DYSFUNCTION, ORGANIC 05/30/2010  . Essential hypertension 06/24/2009   Qualifier: Diagnosis of  By: [name], [name]   . Fibromyalgia   . Fracture dislocation of ankle joint 09/02/2015  . Gait disorder 03/12/2013  . General weakness 07/14/2014  . GERD (gastroesophageal reflux disease) 09/08/2015  . Hand joint pain 06/10/2013  . Heart murmur   . Hepatitis C   . History of kidney  stones   . Hyperlipidemia 07/15/2009   Qualifier: Diagnosis of  By: Aundra Dubin, MD, [place]    . HYPERLIPIDEMIA-MIXED 07/15/2009  . HYPERTENSION 06/24/2009  . Insomnia 10/04/2011  . Irregular heart beat   . Left hip pain 03/12/2013   Injected under ultrasound guidance on June 24, 2013   . Major depression 09/13/2015  . Myocardial infarction ([place]) 2008  . Non-cardiac chest pain 10/2011, 01/2013  . Obesity   . Occult blood positive stool 10/17/2014  . Open ankle fracture 09/02/2015  . OSA (obstructive sleep apnea)    not using CPAP (09/30/2013)  . Pain of right thumb 04/03/2013  . Pneumonia   . PPD positive 04/08/2015  . Pre-ulcerative corn or callous 02/06/2013  . Rotator cuff tear arthropathy of both shoulders 06/10/2013   History of bilateral shoulder cuff surgery for rotator cuff tears. Reports increase in pain 09/11/2015 during physical therapy of the left shoulder.   [name] SCIATICA, LEFT 04/19/2010   Qualifier: Diagnosis of  By: [name], [name]   . Spinal stenosis in cervical region 09/26/2013  . Spinal stenosis, lumbar region, with neurogenic claudication 09/26/2013  . Type II diabetes mellitus ([place]) 2012   no meds in 09/2014.   [name] Uncontrolled type 2 DM with peripheral circulatory disorder ([place]) 10/04/2013  . URETHRAL STRICTURE 06/24/2009   self catheterizes.    Past Surgical History:  Procedure Laterality  Date  . AMPUTATION Right 06/14/2016   Procedure: AMPUTATION BELOW KNEE;  Surgeon: Newt Minion, MD;  Location: [place];  Service: Orthopedics;  Laterality: Right;  . ANKLE FUSION Right 04/15/2014   Procedure: Right Subtalar, Talonavicular Fusion;  Surgeon: Newt Minion, MD;  Location: [place];  Service: Orthopedics;  Laterality: Right;  . ANKLE FUSION Right 04/18/2016   Procedure: Right Ankle Tibiocalcaneal Fusion;  Surgeon: Newt Minion, MD;  Location: [place];  Service: Orthopedics;  Laterality: Right;  . ANTERIOR CERVICAL DECOMP/DISCECTOMY FUSION N/A 09/26/2013   Procedure: ANTERIOR CERVICAL  DISCECTOMY FUSION C3-4, plate and screw fixation, allograft bone graft;  Surgeon: Jessy Oto, MD;  Location: [place];  Service: Orthopedics;  Laterality: N/A;  . BACK SURGERY    . BELOW KNEE LEG AMPUTATION Right 06/14/2016  . CARDIAC CATHETERIZATION  X 1  . CARPAL TUNNEL RELEASE Bilateral   . COLONOSCOPY    . COLONOSCOPY N/A 10/22/2014   Procedure: COLONOSCOPY;  Surgeon: Lafayette Dragon, MD;  Location: [facility] ENDOSCOPY;  Service: Endoscopy;  Laterality: N/A;  . CORONARY ANGIOPLASTY WITH STENT PLACEMENT     "I have 9 stents"  . ESOPHAGOGASTRODUODENOSCOPY N/A 10/19/2014   Procedure: ESOPHAGOGASTRODUODENOSCOPY (EGD);  Surgeon: Jerene Bears, MD;  Location: [facility] ENDOSCOPY;  Service: Endoscopy;  Laterality: N/A;  . FRACTURE SURGERY    . FUSION OF TALONAVICULAR JOINT Right 04/15/2014   dr duda  . HERNIA REPAIR     umbilical  . INGUINAL HERNIA REPAIR Right 05/11/2015   Procedure: LAPAROSCOPIC REPAIR RIGHT  INGUINAL HERNIA;  Surgeon: Greer Pickerel, MD;  Location: [place];  Service: General;  Laterality: Right;  . INSERTION OF MESH Right 05/11/2015   Procedure: INSERTION OF MESH;  Surgeon: Greer Pickerel, MD;  Location: [place];  Service: General;  Laterality: Right;  . JOINT REPLACEMENT    . KNEE CARTILAGE SURGERY Right X 12   "~ 1/2 open; ~ 1/2 scopes"  . KNEE CARTILAGE SURGERY Left X 3   "3 scopes"  . LEFT HEART CATHETERIZATION WITH CORONARY ANGIOGRAM N/A 02/10/2013   Procedure: LEFT HEART CATHETERIZATION WITH CORONARY ANGIOGRAM;  Surgeon: Burnell Blanks, MD;  Location: [facility] CATH LAB;  Service: Cardiovascular;  Laterality: N/A;  . LUMBAR LAMINECTOMY/DECOMPRESSION MICRODISCECTOMY N/A 01/27/2014   Procedure: CENTRAL LUMBAR LAMINECTOMY L4-5 AND L3-4;  Surgeon: Jessy Oto, MD;  Location: [place];  Service: Orthopedics;  Laterality: N/A;  . ORIF ANKLE FRACTURE Right 09/02/2015   Procedure: OPEN REDUCTION INTERNAL FIXATION (ORIF) ANKLE FRACTURE;  Surgeon: Newt Minion, MD;  Location: [place];  Service: Orthopedics;   Laterality: Right;  . PERIPHERALLY INSERTED CENTRAL CATHETER INSERTION  09/02/2015  . SHOULDER ARTHROSCOPY W/ ROTATOR CUFF REPAIR Bilateral    "3 on the right; 1 on the left"  . SKIN SPLIT GRAFT Right 10/01/2015   Procedure: RIGHT ANKLE APPLY SKIN GRAFT SPLIT THICKNESS;  Surgeon: Newt Minion, MD;  Location: [place];  Service: Orthopedics;  Laterality: Right;  . TONSILLECTOMY    . TOOTH EXTRACTION    . TOTAL HIP ARTHROPLASTY Right 04/17/2018  . TOTAL HIP ARTHROPLASTY Right 04/17/2018   Procedure: RIGHT TOTAL HIP ARTHROPLASTY;  Surgeon: Newt Minion, MD;  Location: [place];  Service: Orthopedics;  Laterality: Right;  . TOTAL KNEE ARTHROPLASTY Bilateral 2008  . UMBILICAL HERNIA REPAIR     UHR  . URETHRAL DILATION  X 4  . VASECTOMY    . WISDOM TOOTH EXTRACTION     Social History   Socioeconomic  History  . Marital status: Divorced    Spouse name: Not on file  . Number of children: 2  . Years of education: Not on file  . Highest education level: Not on file  Occupational History  . Occupation: disabled since 07-24-2004 due to ortho. heart, psych    Employer: UNEMPLOYED  . Occupation: part time work as an Multimedia programmer, wrestling, and Holiday representative  Social Needs  . Financial resource strain: Not on file  . Food insecurity:    Worry: Not on file    Inability: Not on file  . Transportation needs:    Medical: Not on file    Non-medical: Not on file  Tobacco Use  . Smoking status: Former Smoker    Types: Cigars    Last attempt to quit: 08/28/2010    Years since quitting: 7.8  . Smokeless tobacco: Never Used  . Tobacco comment: 04/18/2016 "smoked 1 cigar/wk when I did smoke"  Substance and Sexual Activity  . Alcohol use: Yes    Alcohol/week: 0.0 standard drinks    Comment: rarely  . Drug use: No  . Sexual activity: Not Currently  Lifestyle  . Physical activity:    Days per week: Not on file    Minutes per session: Not on file  . Stress: Not on file   Relationships  . Social connections:    Talks on phone: Not on file    Gets together: Not on file    Attends religious service: Not on file    Active member of club or organization: Not on file    Attends meetings of clubs or organizations: Not on file    Relationship status: Not on file  Other Topics Concern  . Not on file  Social History Narrative   Played semi-pro football, used steroids   Divorced moved here from [place], [place]   Patient states he has been on disability since his knee surgery.      03/05/2013 AHW "[name]" was born and grew up in [place], [place], and moved to [place], [place] at age 67. He has one sister and 3 brothers. He reports that his childhood was rough. His parents are deceased. He graduated from [employer], and attended one year of college. He is currently unemployed and on disability for multiple medical problems. He has been married 5 times. He has 2 sons. He currently lives alone. 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 He denies that he has any social support system. 03/05/2013 AHW         Family History  Problem Relation Age of Onset  . Depression Mother   . Heart disease Mother   . Hypertension Mother   . Breast cancer Mother   . Diabetes Father   . Heart disease Father        CABG  . Hypertension Father   . Hyperlipidemia Father   . Prostate cancer Father   . Skin cancer Father   . Depression Brother        x 2  . Hypertension Brother        x2  . Healthy Son   . Heart disease Maternal Grandfather   . Early death Maternal Grandfather   . Heart attack Maternal Grandfather 07-25-2063  . Early death Paternal Grandfather   . Coronary artery disease Other   . Hypertension Other   . Depression Other   . Healthy Son    No Known Allergies Prior  to Admission medications   Medication Sig Start Date End Date Taking? Authorizing Provider  amoxicillin (AMOXIL) 500 MG capsule Take 2,000 mg by mouth See  admin instructions. Take 2000 mg by mouth 1 hour prior to dental appointment 06/26/18  Yes [provider]  ARIPiprazole (ABILIFY) 5 MG tablet Take 1 tablet (5 mg total) by mouth daily. Patient taking differently: Take 5 mg by mouth at bedtime.  07/01/18  Yes Arfeen, Arlyce Harman, MD  aspirin 81 MG tablet Take 81 mg by mouth at bedtime.    Yes [provider]  atorvastatin (LIPITOR) 20 MG tablet Take 1 tablet (20 mg total) by mouth daily. Patient taking differently: Take 20 mg by mouth at bedtime.  01/03/18  Yes End, Harrell Gave, MD  benztropine (COGENTIN) 0.5 MG tablet Take 1 tablet (0.5 mg total) by mouth at bedtime. 02/18/18 02/18/19 Yes Arfeen, Arlyce Harman, MD  clonazePAM (KLONOPIN) 0.5 MG tablet Take 1 tablet (0.5 mg total) by mouth at bedtime. 06/03/18  Yes [name], MD  cyclobenzaprine (FLEXERIL) 5 MG tablet TAKE ONE TABLET BY MOUTH THREE TIMES DAILY AS NEEDED muscle spasm Patient taking differently: Take 5 mg by mouth 3 (three) times daily as needed for muscle spasms. TAKE ONE TABLET BY MOUTH THREE TIMES DAILY AS NEEDED muscle spasm 05/13/18  Yes [name], MD  DULoxetine (CYMBALTA) 30 MG capsule Take 1 capsule (30 mg total) by mouth 2 (two) times daily. 07/01/18  Yes Arfeen, Arlyce Harman, MD  gabapentin (NEURONTIN) 800 MG tablet Take 800 mg by mouth 3 (three) times daily.    Yes [provider]  hydrochlorothiazide (HYDRODIURIL) 25 MG tablet Take 25 mg by mouth at bedtime.    Yes [provider]  HYDROcodone-acetaminophen (NORCO) 10-325 MG tablet Take 1 tablet by mouth every 6 (six) hours as needed for pain. 06/25/18  Yes [provider]  Melatonin 5 MG TABS Take 20 mg by mouth at bedtime.  12/31/17  Yes [provider]  mirabegron ER (MYRBETRIQ) 50 MG TB24 tablet Take 50 mg by mouth at bedtime.    Yes [provider]  Omega-3 Fatty Acids (FISH OIL) 1000 MG CAPS Take 1,000 mg by mouth at bedtime.    Yes [provider]  pantoprazole  (PROTONIX) 40 MG tablet TAKE ONE TABLET BY MOUTH EVERY DAY Patient taking differently: Take 40 mg by mouth at bedtime.  07/05/18  Yes [name], MD  prasugrel (EFFIENT) 5 MG TABS tablet TAKE ONE TABLET BY MOUTH EVERY DAY Patient taking differently: Take 5 mg by mouth at bedtime.  10/25/17  Yes End, Harrell Gave, MD  traZODone (DESYREL) 150 MG tablet Take 1 tablet (150 mg total) by mouth at bedtime. 07/01/18  Yes Arfeen, Arlyce Harman, MD  oxyCODONE-acetaminophen (PERCOCET/ROXICET) 5-325 MG tablet Take 1 tablet by mouth every 4 (four) hours as needed. Patient not taking: Reported on 07/11/2018 04/22/18   Newt Minion, MD     Positive ROS: All other systems have been reviewed and were otherwise negative with the exception of those mentioned in the HPI and as above.  Physical Exam: General: Alert, no acute distress Cardiovascular: No pedal edema Respiratory: No cyanosis, no use of accessory musculature GI: No organomegaly, abdomen is soft and non-tender Skin: No lesions in the area of chief complaint Neurologic: Sensation intact distally Psychiatric: Patient is competent for consent with normal mood and affect Lymphatic: No axillary or cervical lymphadenopathy  MUSCULOSKELETAL: Left knee extension weakness 4/5, left foot DF weakeness 4+/5  right below knee amputation.    Assessment: recurrent spinal stenosis L4-5 with left L4 foraminal stenosis  Plan: Plan for Procedure(s): LEFT L4-5 REDO PARTIAL LUMBAR HEMILAMINECTOMY WITH FORAMINOTOMY LEFT L4  The risks benefits and alternatives were discussed with the patient including but not limited to the risks of nonoperative treatment, versus surgical intervention including infection, bleeding, nerve injury,  blood clots, cardiopulmonary complications, morbidity, mortality, among others, and they were willing to proceed.   [name], MD Cell [phone] Office [phone] 07/16/2018 7:24 AM

## 2018-07-16 NOTE — Discharge Instructions (Addendum)
No lifting greater than 10 lbs. Avoid bending, stooping and twisting. Walk in house for first week them may start to get out slowly increasing distance up to one mile by 3 weeks post op. Keep incision dry but may bath shower with shower chair, may use dressing while bathing and change afterwards..    Laminectomy Laminectomy is a surgery to remove:  Small pieces of bone in the spine (lamina).  Tough, cord-like tissues that connect bones to other bones (ligaments) underneath the lamina. These ligaments connect the bones in the spine (vertebrae).  Parts of joints in the spine (facet joints) that have grown too large. The goals of this surgery are:  To reduce pressure on nerves and the spinal cord.  To reduce pain, numbness, and discomfort. You may need this procedure if you have narrowing of the spinal canal (spinal stenosis) or if you have a spinal tumor, spinal injury, or Paget disease of bone. Tell a health care provider about:  Any allergies you have.  All medicines you are taking, including vitamins, herbs, eye drops, creams, and over-the-counter medicines.  Any problems you or family members have had with anesthetic medicines.  Any blood disorders you have.  Any surgeries you have had.  Any medical conditions you have, including a cold or any infections.  Whether you are pregnant or may be pregnant. What are the risks? Generally, this is a safe procedure. However, problems may occur, including:  Infection.  Bleeding.  Allergic reactions to medicines or dyes.  Damage to other structures or organs, such as nerves. Nerve damage can cause pain, weakness, or numbness.  Leaking of spinal fluid.  A blood clot in a leg. The clot can move to the lungs, which can be very serious.  Inability to control when you urinate (urinary incontinence) or when you have bowel movements (fecal incontinence). This is rare. What happens before the procedure? Staying  hydrated Follow instructions from your health care provider about hydration, which may include:  Up to 2 hours before the procedure - you may continue to drink clear liquids, such as water, clear fruit juice, black coffee, and plain tea. Eating and drinking restrictions Follow instructions from your health care provider about eating and drinking, which may include:  24 hours before the procedure - stop drinking alcohol.  8 hours before the procedure - stop eating heavy meals or foods such as meat, fried foods, or fatty foods.  6 hours before the procedure - stop eating light meals or foods, such as toast or cereal.  6 hours before the procedure - stop drinking milk or drinks that contain milk.  2 hours before the procedure - stop drinking clear liquids. Medicines  Ask your health care provider about: ? Changing or stopping your regular medicines. This is especially important if you are taking diabetes medicines or blood thinners. If your health care provider asks you to keep taking some medicines, take them with a sip of water. ? Taking medicines such as aspirin and ibuprofen. These medicines can thin your blood. Do not take these medicines before your procedure if your health care provider instructs you not to.  You may be given antibiotic medicine to help prevent infection. General instructions  Do not use any products that contain nicotine or tobacco, such as cigarettes and e-cigarettes, for at least 2 weeks before the procedure. If you need help quitting, ask your health care provider.  Ask your health care provider how your surgical site will be marked  or identified.  You may have tests done, such as blood tests or an electrocardiogram (ECG).  If you will be going home right after the procedure, plan to have someone with you for 24 hours.  Plan to have someone: ? Take you home from the hospital or clinic. ? Help you at home for the first week after the procedure. What happens  during the procedure?  To reduce your risk of infection, your health care team will wash or sanitize their hands.  Small monitors will be placed on your body to check your heart rate, blood pressure, and oxygen level.  An IV tube will be inserted into one of your veins.  You will be givena medicine to make you fall asleep (general anesthetic). You may also be given a medicine to help you relax (sedative).  A breathing tube will be placed into your lungs.  Your back will be cleaned with a germ-killing solution.  An incision will be made in your back. The incision may be 2-5 inches (5-13 cm) long, depending on how many vertebrae are being operated on.  Muscles in your back will be moved away from the vertebrae and pulled to the side.  Pieces of lamina will be removed.  Ligaments and thickened facet joints will be removed. How much tissue and bone is removed depends on how much of the tissue is putting pressure on your nerves.  Your nerves will be identified and evaluated to check for excessive tightness.  Your back muscles will be moved back into their normal position.  The area under your skin will be closed with small, dissolvable stitches (sutures).  Your skin will be closed with small, absorbable sutures or staples.  A bandage (dressing) will be placed over your incision. The procedure may vary among health care providers and hospitals. What happens after the procedure?  Your blood pressure, heart rate, breathing rate, and blood oxygen level will be monitored until the medicines you were given have worn off.  You may continue receive medicines and fluids through an IV tube.  You will have some back pain. You will be given pain medicine as needed.  It is important to be up and moving as soon as possible after this procedure. Physical therapists will help you start walking.  To prevent blood clots in your legs: ? You may have to wear compression stockings. These stockings  also reduce swelling in your legs. ? You may need to take medicines.  You may need to do breathing exercises to help prevent lung infection.  Do not drive for 24 hours if you received a sedative. Summary  Laminectomy is a procedure that is done to reduce pressure on nerves and the spinal cord and to reduce pain, numbness, and discomfort.  If you will be going home right after the procedure, plan to have someone with you for 24 hours.  You will have some back pain. You will be given pain medicine as needed.  It is important to be up and moving as soon as possible after this procedure. Physical therapists will help you start walking. This information is not intended to replace advice given to you by your health care provider. Make sure you discuss any questions you have with your health care provider. Document Released: 04/26/2009 Document Revised: 12/23/2015 Document Reviewed: 10/24/2015 Elsevier Interactive Patient Education  2019 [place].   Laminectomy, Care After This sheet gives you information about how to care for yourself after your procedure. Your health care provider  may also give you more specific instructions. If you have problems or questions, contact your health care provider. What can I expect after the procedure? After the procedure, it is common to have:  Some pain around your incision area.  Muscle tightening (spasms) across the back. Follow these instructions at home: Incision care   Follow instructions from your health care provider about how to take care of your incision area. Make sure you: ? Wash your hands with soap and water before and after you apply medicine to the area or change your bandage (dressing). If soap and water are not available, use hand sanitizer. ? Change your dressing as told by your health care provider. ? Leave stitches (sutures), skin glue, or adhesive strips in place. These skin closures may need to stay in place for 2 weeks or  longer. If adhesive strip edges start to loosen and curl up, you may trim the loose edges. Do not remove adhesive strips completely unless your health care provider tells you to do that.  Check your incision area every day for signs of infection. Check for: ? More redness, swelling, or pain. ? More fluid or blood. ? Warmth. ? Pus or a bad smell. Medicines  Take over-the-counter and prescription medicines only as told by your health care provider.  If you were prescribed an antibiotic medicine, use it as told by your health care provider. Do not stop using the antibiotic even if you start to feel better. Bathing  Do not take baths, swim, or use a hot tub for 2 weeks, or until your incision has healed completely.  If your health care provider approves, you may take showers after your dressing has been removed. Activity   Return to your normal activities as told by your health care provider. Ask your health care provider what activities are safe for you.  Avoid bending or twisting at your waist. Always bend at your knees.  Do not sit for more than 20-30 minutes at a time. Lie down or walk between periods of sitting.  Do not lift anything that is heavier than 10 lb (4.5 kg) or the limit that your health care provider tells you, until he or she says that it is safe.  Do not drive for 2 weeks after your procedure or for as long as your health care provider tells you.  Do not drive or use heavy machinery while taking prescription pain medicine. General instructions  To prevent or treat constipation while you are taking prescription pain medicine, your health care provider may recommend that you: ? Drink enough fluid to keep your urine clear or pale yellow. ? Take over-the-counter or prescription medicines. ? Eat foods that are high in fiber, such as fresh fruits and vegetables, whole grains, and beans. ? Limit foods that are high in fat and processed sugars, such as fried and sweet  foods.  Do breathing exercises as told.  Keep all follow-up visits as told by your health care provider. This is important. Contact a health care provider if:  You have more redness, swelling, or pain around your incision area.  Your incision feels warm to the touch.  You are not able to return to activities or do exercises as told by your health care provider. Get help right away if:  You have: ? More fluid or blood coming from your incision area. ? Pus or a bad smell coming from your incision area. ? Chills or a fever. ? Episodes of dizziness or fainting  while standing.  You develop a rash.  You develop shortness of breath or you have difficulty breathing.  You cannot control when you urinate or have a bowel movement.  You become weak.  You are not able to use your legs. Summary  After the procedure, it is common to have some pain around your incision area. You may also have muscle tightening (spasms) across the back.  Follow instructions from your health care provider about how to care for your incision.  Do not lift anything that is heavier than 10 lb (4.5 kg) or the limit that your health care provider tells you, until he or she says that it is safe.  Contact your health care provider if you have more redness, swelling, or pain around your incision area or if your incision feels warm to the touch. These can be signs of infection. This information is not intended to replace advice given to you by your health care provider. Make sure you discuss any questions you have with your health care provider. Document Released: 11/25/2004 Document Revised: 12/23/2015 Document Reviewed: 10/24/2015 Elsevier Interactive Patient Education  2019 [employer].

## 2018-07-16 NOTE — Brief Op Note (Signed)
07/16/2018  10:24 AM  PATIENT:  Travis Novak Sr.  68 y.o. male  PRE-OPERATIVE DIAGNOSIS:  recurrent spinal stenosis L4-5 with left L4 foraminal stenosis  POST-OPERATIVE DIAGNOSIS:  recurrent spinal stenosis L4-5 with left L4 foraminal stenosis  PROCEDURE:  Procedure(s): LEFT L4-5 REDO PARTIAL LUMBAR HEMILAMINECTOMY WITH FORAMINOTOMY LEFT L4 (N/A)  SURGEON:  Surgeon(s) and Role:    * Jessy Oto, MD - Primary  ASSISTANTS: CRNFA   ANESTHESIA:   local and general, Dr. Esperanza Richters  EBL:  100 mL   BLOOD ADMINISTERED:none  DRAINS: none   LOCAL MEDICATIONS USED:  MARCAINE 0.5% 1:1 EXPAREL 1.3%Amount: 30 ml  SPECIMEN:  No Specimen  DISPOSITION OF SPECIMEN:  N/A  COUNTS:  YES  TOURNIQUET:  * No tourniquets in log *  DICTATION: .Dragon Dictation  PLAN OF CARE: Admit to inpatient   PATIENT DISPOSITION:  PACU - hemodynamically stable.   Delay start of Pharmacological VTE agent (>24hrs) due to surgical blood loss or risk of bleeding: yes

## 2018-07-16 NOTE — Interval H&P Note (Signed)
History and Physical Interval Note:  07/16/2018 7:26 AM  Travis Novak Sr.  has presented today for surgery, with the diagnosis of recurrent spinal stenosis L4-5 with left L4 foraminal stenosis  The various methods of treatment have been discussed with the patient and family. After consideration of risks, benefits and other options for treatment, the patient has consented to  Procedure(s): LEFT L4-5 REDO PARTIAL LUMBAR HEMILAMINECTOMY WITH FORAMINOTOMY LEFT L4 (N/A) as a surgical intervention .  The patient's history has been reviewed, patient examined, no change in status, stable for surgery.  I have reviewed the patient's chart and labs.  Questions were answered to the patient's satisfaction.     [name]

## 2018-07-16 NOTE — Plan of Care (Signed)
  Problem: Pain Managment: Goal: General experience of comfort will improve Outcome: Progressing   

## 2018-07-16 NOTE — Transfer of Care (Signed)
Immediate Anesthesia Transfer of Care Note  Patient: Travis LEFEVERS Sr.  Procedure(s) Performed: LEFT L4-5 REDO PARTIAL LUMBAR HEMILAMINECTOMY WITH FORAMINOTOMY LEFT L4 (N/A Spine Lumbar)  Patient Location: PACU  Anesthesia Type:General  Level of Consciousness: awake, alert , oriented and sedated  Airway & Oxygen Therapy: Patient Spontanous Breathing and Patient connected to nasal cannula oxygen  Post-op Assessment: Report given to RN, Post -op Vital signs reviewed and stable and Patient moving all extremities  Post vital signs: Reviewed and stable  Last Vitals:  Vitals Value Taken Time  BP 140/62 07/16/2018 10:45 AM  Temp    Pulse 84 07/16/2018 10:50 AM  Resp 15 07/16/2018 10:50 AM  SpO2 93 % 07/16/2018 10:50 AM  Vitals shown include unvalidated device data.  Last Pain:  Vitals:   07/16/18 0557  TempSrc: Oral  PainSc:          Complications: No apparent anesthesia complications

## 2018-07-16 NOTE — Anesthesia Postprocedure Evaluation (Signed)
Anesthesia Post Note  Patient: Travis SOFIA Sr.  Procedure(s) Performed: LEFT L4-5 REDO PARTIAL LUMBAR HEMILAMINECTOMY WITH FORAMINOTOMY LEFT L4 (N/A Spine Lumbar)     Patient location during evaluation: PACU Anesthesia Type: General Level of consciousness: awake and alert Pain management: pain level controlled Vital Signs Assessment: post-procedure vital signs reviewed and stable Respiratory status: spontaneous breathing, nonlabored ventilation, respiratory function stable and patient connected to nasal cannula oxygen Cardiovascular status: blood pressure returned to baseline and stable Postop Assessment: no apparent nausea or vomiting Anesthetic complications: no    Last Vitals:  Vitals:   07/16/18 1130 07/16/18 1201  BP: (!) 109/59 111/60  Pulse: 76 75  Resp: 12 17  Temp:  36.7 C  SpO2: 94% 91%    Last Pain:  Vitals:   07/16/18 1206  TempSrc:   PainSc: 0-No pain                 Joycie Aerts P Inga Noller

## 2018-07-17 ENCOUNTER — Other Ambulatory Visit: Payer: Self-pay

## 2018-07-17 ENCOUNTER — Other Ambulatory Visit (HOSPITAL_COMMUNITY): Payer: Self-pay | Admitting: Psychiatry

## 2018-07-17 ENCOUNTER — Encounter (HOSPITAL_COMMUNITY): Payer: Self-pay | Admitting: Specialist

## 2018-07-17 DIAGNOSIS — F319 Bipolar disorder, unspecified: Secondary | ICD-10-CM

## 2018-07-17 LAB — CBC
HCT: 35.4 % — ABNORMAL LOW (ref 39.0–52.0)
Hemoglobin: 11.3 g/dL — ABNORMAL LOW (ref 13.0–17.0)
MCH: 26.7 pg (ref 26.0–34.0)
MCHC: 31.9 g/dL (ref 30.0–36.0)
MCV: 83.7 fL (ref 80.0–100.0)
PLATELETS: 237 10*3/uL (ref 150–400)
RBC: 4.23 MIL/uL (ref 4.22–5.81)
RDW: 13.5 % (ref 11.5–15.5)
WBC: 9.2 10*3/uL (ref 4.0–10.5)
nRBC: 0 % (ref 0.0–0.2)

## 2018-07-17 LAB — BASIC METABOLIC PANEL
Anion gap: 10 (ref 5–15)
BUN: 19 mg/dL (ref 8–23)
CALCIUM: 9 mg/dL (ref 8.9–10.3)
CO2: 26 mmol/L (ref 22–32)
Chloride: 101 mmol/L (ref 98–111)
Creatinine, Ser: 1.02 mg/dL (ref 0.61–1.24)
GFR calc Af Amer: >60 mL/min (ref >60)
GFR calc non Af Amer: >60 mL/min (ref >60)
Glucose, Bld: 154 mg/dL — ABNORMAL HIGH (ref 70–99)
Potassium: 3.6 mmol/L (ref 3.5–5.1)
SODIUM: 137 mmol/L (ref 135–145)

## 2018-07-17 MED ORDER — OXYCODONE-ACETAMINOPHEN 5-325 MG PO TABS: 1.0000 | ORAL_TABLET | ORAL | 0 refills | Status: DC | PRN

## 2018-07-17 MED ORDER — CYCLOBENZAPRINE HCL 5 MG PO TABS: 5.0000 mg | ORAL_TABLET | Freq: Three times a day (TID) | ORAL | 0 refills | Status: DC

## 2018-07-17 MED ORDER — CLONAZEPAM 0.5 MG PO TABS: 0.5000 mg | ORAL_TABLET | Freq: Every day | ORAL | 0 refills | Status: DC

## 2018-07-17 MED ORDER — ARIPIPRAZOLE 5 MG PO TABS: 5.0000 mg | ORAL_TABLET | Freq: Every day | ORAL | 3 refills | Status: DC

## 2018-07-17 MED ORDER — GABAPENTIN 800 MG PO TABS: 800.0000 mg | ORAL_TABLET | Freq: Three times a day (TID) | ORAL | 1 refills | Status: DC

## 2018-07-17 MED FILL — Thrombin (Recombinant) For Soln 20000 Unit: CUTANEOUS | Qty: 1 | Status: AC

## 2018-07-17 NOTE — Evaluation (Signed)
Physical Therapy Evaluation Patient Details Name: Travis NETTLE Sr. MRN: [id number] DOB: 08-25-1950 Today's Date: 07/17/2018   History of Present Illness  Pt is a 68 yo male admitted for left L4/5 laminectomy redo after increasing L LE weakness. PMH including R THA, R BKA, bil TKA, DM, hep C, fibromyalgia, CAD.   Clinical Impression   Pt received in bed, willing to participate in therapy. Reviewed back precautions with pt; he was able to say 2/3 from memory from previous surgery. Bed mobility with min guard, heavy use of bed rails and HOB elevated. Difficulty with sitting to a low chair, requiring minA+2 to control descent. He reports that his bed is low to the ground, and the couch is also low to the ground. Pt able to ambulate household distances but recommending SNF due to the set up he required to stand for walking. His family is not available during the day, so pt needs to be independent in order to be safe to go home. Recommending post-acute rehab in order to maximize pt's independence with his functional mobility and ensure safe DC home.    Follow Up Recommendations SNF    Equipment Recommendations  Other (comment)(defer to next venue)    Recommendations for Other Services       Precautions / Restrictions Precautions Precautions: Fall;Back Precaution Booklet Issued: No Precaution Comments: Verbally reviewed back precautions with pt- he was able to remember 2/3 precautions from previous back surgery. Restrictions Weight Bearing Restrictions: No Other Position/Activity Restrictions: no bending or arching back, no twisting, no lifting      Mobility  Bed Mobility Overal bed mobility: Needs Assistance Bed Mobility: Rolling;Sidelying to Sit Rolling: Min guard Sidelying to sit: Min guard;HOB elevated       General bed mobility comments: Min guard for safety and to ensure pt maintained precautions- he was able to safely log roll without twisting and elevate trunk from  elevated HOB  Transfers Overall transfer level: Needs assistance Equipment used: Rolling walker (2 wheeled)(R prosthetic limb) Transfers: Sit to/from Stand Sit to Stand: Min guard;Min assist;+2 physical assistance;From elevated surface         General transfer comment: pt able to stand with min guard from elevated bed, but he required minA of 2 to safely sit to a lower chair. Required totalA to set up prosthetic limb for standing while maintaining precautions- once set up and with bed elevated, he was able to don prosthetic limb independently  Ambulation/Gait Ambulation/Gait assistance: Min guard Gait Distance (Feet): 60 Feet Assistive device: Rolling walker (2 wheeled) Gait Pattern/deviations: Step-through pattern;Wide base of support Gait velocity: decreased   General Gait Details: Pt ambulates with R prosthetic limb and RW, safely and steadily. Trunk lean to the R due to scoliosis. V/c to take short steps while turning to prevent twisting back.  Stairs            Wheelchair Mobility    Modified Rankin (Stroke Patients Only)       Balance Overall balance assessment: Needs assistance Sitting-balance support: Feet supported;No upper extremity supported Sitting balance-Leahy Scale: Good Sitting balance - Comments: Pt able to don prosthetic limb while maintaining sitting balance at EOB, min guard for safety     Standing balance-Leahy Scale: Poor Standing balance comment: Pt requires RW to maintain balance in standing and during ambulation.                             Pertinent Vitals/Pain  Pain Assessment: 0-10 Pain Score: 7 (increased pain with movement) Pain Location: low back Pain Descriptors / Indicators: Grimacing;Guarding Pain Intervention(s): Limited activity within patient's tolerance;Monitored during session;Premedicated before session;Repositioned    Home Living Family/patient expects to be discharged to:: Private residence Living  Arrangements: Other relatives Available Help at Discharge: Family;Available PRN/intermittently Type of Home: Mobile home Home Access: Stairs to enter Entrance Stairs-Rails: Can reach both Entrance Stairs-Number of Steps: 7 Home Layout: One level Home Equipment: Walker - 2 wheels;Cane - single point;Crutches;Bedside commode;Shower seat;Toilet riser Additional Comments: lives with niece and family    Prior Function Level of Independence: Independent with assistive device(s)         Comments: uses RW for ambulation. family assists with cooking, cleaning     Hand Dominance   Dominant Hand: Right    Extremity/Trunk Assessment   Upper Extremity Assessment Upper Extremity Assessment: Defer to OT evaluation    Lower Extremity Assessment Lower Extremity Assessment: LLE deficits/detail;RLE deficits/detail RLE Deficits / Details: R BKA- wears a prosthetic; today residual limb is swollen LLE Deficits / Details: pt states that L LE is generally weaker caused by lumbar stenosis    Cervical / Trunk Assessment Cervical / Trunk Assessment: Other exceptions Cervical / Trunk Exceptions: notable scoliosis with R curve  Communication   Communication: No difficulties  Cognition Arousal/Alertness: Awake/alert Behavior During Therapy: WFL for tasks assessed/performed Overall Cognitive Status: Within Functional Limits for tasks assessed                                        General Comments General comments (skin integrity, edema, etc.): R LE edema- pt agrees to sleep in prosthetic liner to reduce swelling; he says he will have his socks tomorrow    Exercises     Assessment/Plan    PT Assessment Patient needs continued PT services  PT Problem List Decreased strength;Decreased balance;Decreased knowledge of precautions;Decreased mobility;Decreased knowledge of use of DME;Decreased activity tolerance;Decreased coordination;Decreased safety awareness;Pain       PT  Treatment Interventions DME instruction;Functional mobility training;Balance training;Patient/family education;Gait training;Neuromuscular re-education;Therapeutic activities;Stair training;Therapeutic exercise    PT Goals (Current goals can be found in the Care Plan section)  Acute Rehab PT Goals Patient Stated Goal: go to rehab so he can be independent before going home PT Goal Formulation: With patient Time For Goal Achievement: 07/31/18 Potential to Achieve Goals: Good    Frequency Min 3X/week   Barriers to discharge Inaccessible home environment;Decreased caregiver support Pt only has intermittent caregiver support- family works during the day. He also has 7 steps to enter his home and has difficulty safely getting to and from low surfaces- his bed is a low surface.    Co-evaluation PT/OT/SLP Co-Evaluation/Treatment: Yes Reason for Co-Treatment: Complexity of the patient's impairments (multi-system involvement);To address functional/ADL transfers;For patient/therapist safety PT goals addressed during session: Mobility/safety with mobility;Proper use of DME;Balance         AM-PAC PT "6 Clicks" Mobility  Outcome Measure Help needed turning from your back to your side while in a flat bed without using bedrails?: A Little Help needed moving from lying on your back to sitting on the side of a flat bed without using bedrails?: A Little Help needed moving to and from a bed to a chair (including a wheelchair)?: A Lot Help needed standing up from a chair using your arms (e.g., wheelchair or bedside chair)?: A Lot Help  needed to walk in hospital room?: None Help needed climbing 3-5 steps with a railing? : A Lot 6 Click Score: 16    End of Session Equipment Utilized During Treatment: Gait belt;Other (comment)(R prosthetic limb) Activity Tolerance: Patient tolerated treatment well Patient left: in chair;with chair alarm set;with call bell/phone within reach Nurse Communication: Mobility  status PT Visit Diagnosis: Other abnormalities of gait and mobility (R26.89);Muscle weakness (generalized) (M62.81);Pain Pain - part of body: (back)    Time:  -      Charges:              Ronnell Guadalajara, SPT   Ronnell Guadalajara 07/17/2018, 2:07 PM

## 2018-07-17 NOTE — Plan of Care (Signed)
Problem: Education: Goal: Knowledge of General Education information will improve Description Including pain rating scale, medication(s)/side effects and non-pharmacologic comfort measures Outcome: Progressing   Problem: Health Behavior/Discharge Planning: Goal: Ability to manage health-related needs will improve Outcome: Progressing   Problem: Clinical Measurements: Goal: Respiratory complications will improve Outcome: Progressing Goal: Cardiovascular complication will be avoided Outcome: Progressing   Problem: Nutrition: Goal: Adequate nutrition will be maintained Outcome: Progressing   Problem: Coping: Goal: Level of anxiety will decrease Outcome: Progressing   Problem: Elimination: Goal: Will not experience complications related to urinary retention Outcome: Progressing   Problem: Pain Managment: Goal: General experience of comfort will improve Outcome: Progressing   Problem: Safety: Goal: Ability to remain free from injury will improve Outcome: Progressing   Problem: Skin Integrity: Goal: Risk for impaired skin integrity will decrease Outcome: Progressing

## 2018-07-17 NOTE — Discharge Summary (Addendum)
Physician Discharge Summary      Patient ID: Travis CUTHBERTSON Sr. MRN: [id number] DOB/AGE: 1950/06/06 68 y.o.  Admit date: 07/16/2018 Discharge date: 07/19/2018  Admission Diagnoses:  Principal Problem:   Other spondylosis with radiculopathy, lumbar region Active Problems:   Status post lumbar laminectomy   Discharge Diagnoses:  Same  Past Medical History:  Diagnosis Date  . ALLERGIC RHINITIS 06/24/2009  . Anxiety 11/12/2011   Adequate for discharge   . Arthritis    "all my joints" (09/30/2013)  . Arthritis of foot, right, degenerative 04/15/2014  . Balance disorder 03/12/2013  . Benign neoplasm of cecum   . Benign neoplasm of descending colon   . CAD (coronary artery disease) 06/24/2009   5 stents placed in 2007    . Chronic anticoagulation   . Chronic pain syndrome 10/27/2009   of ankle, shoulders, low back.  sciatica.   . Closed fracture of right foot 10/17/2014  . CORONARY ARTERY DISEASE 06/24/2009   a. s/p multiple PCIs - In 2008 he had a Taxus DES to the mild LAD, Endeavor DES to mid LCX and distal LCX. In January 2009 he had DES to distal LCX, mid LCX and proximal LCX. In November 2009 had BMS x 2 to the mid RCA. Cath 10/2011 with patent stents, noncardiac CP. LHC 01/2013: patent stents (noncardiac CP).  . DEGENERATIVE JOINT DISEASE 06/24/2009   Qualifier: Diagnosis of  By: [name], [name]   . Depression   . Depression with anxiety    Prior suicide attempt  . [place] DISEASE, LUMBAR 04/19/2010  . ERECTILE DYSFUNCTION, ORGANIC 05/30/2010  . Essential hypertension 06/24/2009   Qualifier: Diagnosis of  By: [name], [name]   . Fibromyalgia   . Fracture dislocation of ankle joint 09/02/2015  . Gait disorder 03/12/2013  . General weakness 07/14/2014  . GERD (gastroesophageal reflux disease) 09/08/2015  . Hand joint pain 06/10/2013  . Heart murmur   . Hepatitis C   . History of kidney stones   . Hyperlipidemia 07/15/2009   Qualifier: Diagnosis of  By: Aundra Dubin, MD, [place]    .  HYPERLIPIDEMIA-MIXED 07/15/2009  . HYPERTENSION 06/24/2009  . Insomnia 10/04/2011  . Irregular heart beat   . Left hip pain 03/12/2013   Injected under ultrasound guidance on June 24, 2013   . Major depression 09/13/2015  . Myocardial infarction ([place]) 2008  . Non-cardiac chest pain 10/2011, 01/2013  . Obesity   . Occult blood positive stool 10/17/2014  . Open ankle fracture 09/02/2015  . OSA (obstructive sleep apnea)    not using CPAP (09/30/2013)  . Pain of right thumb 04/03/2013  . Pneumonia   . PPD positive 04/08/2015  . Pre-ulcerative corn or callous 02/06/2013  . Rotator cuff tear arthropathy of both shoulders 06/10/2013   History of bilateral shoulder cuff surgery for rotator cuff tears. Reports increase in pain 09/11/2015 during physical therapy of the left shoulder.   [name] SCIATICA, LEFT 04/19/2010   Qualifier: Diagnosis of  By: [name], [name]   . Spinal stenosis in cervical region 09/26/2013  . Spinal stenosis, lumbar region, with neurogenic claudication 09/26/2013  . Type II diabetes mellitus ([place]) 2012   no meds in 09/2014.   [name] Uncontrolled type 2 DM with peripheral circulatory disorder ([place]) 10/04/2013  . URETHRAL STRICTURE 06/24/2009   self catheterizes.     Surgeries: Procedure(s): LEFT L4-5 REDO PARTIAL LUMBAR HEMILAMINECTOMY WITH FORAMINOTOMY LEFT L4 on 07/16/2018   Consultants:   Discharged Condition: Improved  Hospital Course: Kiwan [place]. is an 68 y.o. male who was admitted 07/16/2018 with a chief complaint of No chief complaint on file. , and found to have a diagnosis of Other spondylosis with radiculopathy, lumbar region. He was brought to the operating room on 07/16/2018 and underwent the above named procedures.    He was given perioperative antibiotics:  Anti-infectives (From admission, onward)   Start     Dose/Rate Route Frequency Ordered Stop   07/16/18 1530  ceFAZolin (ANCEF) IVPB 2g/100 mL premix     2 g 200 mL/hr over 30 Minutes Intravenous Every 8  hours 07/16/18 1159 07/16/18 2356   07/16/18 0630  ceFAZolin (ANCEF) 3 g in dextrose 5 % 50 mL IVPB     3 g 100 mL/hr over 30 Minutes Intravenous To ShortStay Surgical 07/15/18 1358 07/16/18 0736    He recovered in the PACU uneventfully, was transferred to Med-Surg floor [place] Bed 20, His VSS, he was able to void without difficulty and pain was controlled. He stood and was able to ambulate to bathroom with assistance, he had a right below knee amputation and requires assistance in standing and walking and the use of the BK prosthesis. POD#1 awake, alert and oriented x 4. He tolerated PT and OT and oral nourishment and oral narcotics for pain. SNF was requested and he has been in contact with [facility] for continued post operative care. He was originally schedule as an out patient surgery and it is possible to continue as observation with the plan to transfer to SNF today. Discussion with discharge planning and Dr. Rockne Menghini and we with plan to transfer to SNF for continued post op care and rehab until he is independent enough to go home.Social service contacted and SNF placement initiated. OT and PT recommendation is for SNF until independent for discharge. PASSAR and FL-2 initiated and social service is working to secure carrier approval. POD#2 awake alert, dressing changed, incision is healed and no drainage new dressing continued work with PT and OT. SNF placement. Legs neurovascular intact. Restarted his preop anticoagulation meds prasurgrel 5 mg daily. {OD#3 awake, alert and oriented x 4. Will start to decrease gabapentin to 600 mg TID. Continue prasurgrel 5 mg per day. Dressing dry may start bathing with dressing change after shower on shower chair. Left leg pain gone, left leg and right residual lower extremity are N-V normal. Discharge to SNF VSS, tolerating po narcotics and nourishment. Will continue post op rehabilitation until such time as he is capable of independent living as he lives alone.    He was given sequential compression devices, early ambulation, and chemoprophylaxis for DVT prophylaxis.  He benefited maximally from their hospital stay and there were no complications.    Recent vital signs:  Vitals:   07/18/18 2110 07/19/18 0530  BP: 128/66 129/60  Pulse: 63 (!) 56  Resp: 20 (!) 22  Temp: 98.8 F (37.1 C) 98.7 F (37.1 C)  SpO2: 98% 95%    Recent laboratory studies:  Results for orders placed or performed during the hospital encounter of 07/16/18  Glucose, capillary  Result Value Ref Range   Glucose-Capillary 116 (H) 70 - 99 mg/dL   Comment 1 Notify RN    Comment 2 Document in Chart   Glucose, capillary  Result Value Ref Range   Glucose-Capillary 149 (H) 70 - 99 mg/dL  CBC  Result Value Ref Range   WBC 9.2 4.0 - 10.5 K/uL   RBC 4.23 4.22 -  5.81 MIL/uL   Hemoglobin 11.3 (L) 13.0 - 17.0 g/dL   HCT 35.4 (L) 39.0 - 52.0 %   MCV 83.7 80.0 - 100.0 fL   MCH 26.7 26.0 - 34.0 pg   MCHC 31.9 30.0 - 36.0 g/dL   RDW 13.5 11.5 - 15.5 %   Platelets 237 150 - 400 K/uL   nRBC 0.0 0.0 - 0.2 %  Basic Metabolic Panel  Result Value Ref Range   Sodium 137 135 - 145 mmol/L   Potassium 3.6 3.5 - 5.1 mmol/L   Chloride 101 98 - 111 mmol/L   CO2 26 22 - 32 mmol/L   Glucose, Bld 154 (H) 70 - 99 mg/dL   BUN 19 8 - 23 mg/dL   Creatinine, Ser 1.02 0.61 - 1.24 mg/dL   Calcium 9.0 8.9 - 10.3 mg/dL   GFR calc non Af Amer >60 >60 mL/min   GFR calc Af Amer >60 >60 mL/min   Anion gap 10 5 - 15    Discharge Medications:   Allergies as of 07/19/2018   No Known Allergies     Medication List    STOP taking these medications   HYDROcodone-acetaminophen 10-325 MG tablet Commonly known as:  NORCO     TAKE these medications   amoxicillin 500 MG capsule Commonly known as:  AMOXIL Take 2,000 mg by mouth See admin instructions. Take 2000 mg by mouth 1 hour prior to dental appointment   ARIPiprazole 5 MG tablet Commonly known as:  ABILIFY Take 1 tablet (5 mg total) by  mouth at bedtime.   aspirin 81 MG tablet Take 81 mg by mouth at bedtime.   atorvastatin 20 MG tablet Commonly known as:  LIPITOR Take 1 tablet (20 mg total) by mouth daily. What changed:  when to take this   benztropine 0.5 MG tablet Commonly known as:  COGENTIN Take 1 tablet (0.5 mg total) by mouth at bedtime.   clonazePAM 0.5 MG tablet Commonly known as:  KLONOPIN Take 1 tablet (0.5 mg total) by mouth at bedtime.   cyclobenzaprine 5 MG tablet Commonly known as:  FLEXERIL Take 1 tablet (5 mg total) by mouth 3 (three) times daily for 30 days. TAKE ONE TABLET BY MOUTH THREE TIMES DAILY AS NEEDED muscle spasm What changed:  See the new instructions.   DULoxetine 30 MG capsule Commonly known as:  CYMBALTA Take 1 capsule (30 mg total) by mouth 2 (two) times daily.   Fish Oil 1000 MG Caps Take 1,000 mg by mouth at bedtime.   gabapentin 600 MG tablet Commonly known as:  NEURONTIN Take 1 tablet (600 mg total) by mouth 3 (three) times daily. What changed:    medication strength  how much to take   hydrochlorothiazide 25 MG tablet Commonly known as:  HYDRODIURIL Take 25 mg by mouth at bedtime.   Melatonin 5 MG Tabs Take 20 mg by mouth at bedtime.   MYRBETRIQ 50 MG Tb24 tablet Generic drug:  mirabegron ER Take 50 mg by mouth at bedtime.   oxyCODONE-acetaminophen 5-325 MG tablet Commonly known as:  PERCOCET/ROXICET Take 1-2 tablets by mouth every 4 (four) hours as needed. What changed:  how much to take   pantoprazole 40 MG tablet Commonly known as:  PROTONIX TAKE ONE TABLET BY MOUTH EVERY DAY What changed:  when to take this   prasugrel 5 MG Tabs tablet Commonly known as:  EFFIENT TAKE ONE TABLET BY MOUTH EVERY DAY What changed:  when to take this  traZODone 150 MG tablet Commonly known as:  DESYREL Take 1 tablet (150 mg total) by mouth at bedtime.       Diagnostic Studies: Dg Lumbar Spine 1 View  Result Date: 07/16/2018 CLINICAL DATA:  68 year old  male.  Localization. EXAM: LUMBAR SPINE - 1 VIEW Fluoroscopic time: Not provided. COMPARISON:  01/16/2018 postmyelogram CT.  09/05/2017 MR. FINDINGS: Single intraoperative lateral view of the lumbar spine submitted for review after surgery. Utilizing level assignment of prior MR, metallic probe is posterior to the L4-5 disc space. Second probe L5 spinous process level. IMPRESSION: Localization L4-5 as noted above. Electronically Signed   By: Genia Del M.D.   On: 07/16/2018 12:01    Disposition: Discharge disposition: 03-Skilled Nursing Facility       Discharge Instructions    Call MD / Call 911   Complete by:  As directed    If you experience chest pain or shortness of breath, CALL 911 and be transported to the hospital emergency room.  If you develope a fever above 101 F, pus (white drainage) or increased drainage or redness at the wound, or calf pain, call your surgeon's office.   Constipation Prevention   Complete by:  As directed    Drink plenty of fluids.  Prune juice may be helpful.  You may use a stool softener, such as Colace (over the counter) 100 mg twice a day.  Use MiraLax (over the counter) for constipation as needed.   Diet - low sodium heart healthy   Complete by:  As directed    Discharge instructions   Complete by:  As directed    No lifting greater than 10 lbs. Avoid bending, stooping and twisting. Walk in house for first week them may start to get out slowly increasing distance up to one mile by 3 weeks post op. Keep incision dry but may bath shower with shower chair, may use dressing while bathing and change afterwards..  Laminectomy Laminectomy is a surgery to remove: Small pieces of bone in the spine (lamina). Tough, cord-like tissues that connect bones to other bones (ligaments) underneath the lamina. These ligaments connect the bones in the spine (vertebrae). Parts of joints in the spine (facet joints) that have grown too large. The goals of this surgery  are: To reduce pressure on nerves and the spinal cord. To reduce pain, numbness, and discomfort. You may need this procedure if you have narrowing of the spinal canal (spinal stenosis) or if you have a spinal tumor, spinal injury, or Paget disease of bone. Tell a health care provider about: Any allergies you have. All medicines you are taking, including vitamins, herbs, eye drops, creams, and over-the-counter medicines. Any problems you or family members have had with anesthetic medicines. Any blood disorders you have. Any surgeries you have had. Any medical conditions you have, including a cold or any infections. Whether you are pregnant or may be pregnant. What are the risks? Generally, this is a safe procedure. However, problems may occur, including: Infection. Bleeding. Allergic reactions to medicines or dyes. Damage to other structures or organs, such as nerves. Nerve damage can cause pain, weakness, or numbness. Leaking of spinal fluid. A blood clot in a leg. The clot can move to the lungs, which can be very serious. Inability to control when you urinate (urinary incontinence) or when you have bowel movements (fecal incontinence). This is rare. What happens before the procedure? Staying hydrated Follow instructions from your health care provider about hydration, which may include: Up  to 2 hours before the procedure - you may continue to drink clear liquids, such as water, clear fruit juice, black coffee, and plain tea. Eating and drinking restrictions Follow instructions from your health care provider about eating and drinking, which may include: 24 hours before the procedure - stop drinking alcohol. 8 hours before the procedure - stop eating heavy meals or foods such as meat, fried foods, or fatty foods. 6 hours before the procedure - stop eating light meals or foods, such as toast or cereal. 6 hours before the procedure - stop drinking milk or drinks that contain milk. 2 hours  before the procedure - stop drinking clear liquids. Medicines Ask your health care provider about: Changing or stopping your regular medicines. This is especially important if you are taking diabetes medicines or blood thinners. If your health care provider asks you to keep taking some medicines, take them with a sip of water. Taking medicines such as aspirin and ibuprofen. These medicines can thin your blood. Do not take these medicines before your procedure if your health care provider instructs you not to. You may be given antibiotic medicine to help prevent infection. General instructions Do not use any products that contain nicotine or tobacco, such as cigarettes and e-cigarettes, for at least 2 weeks before the procedure. If you need help quitting, ask your health care provider. Ask your health care provider how your surgical site will be marked or identified. You may have tests done, such as blood tests or an electrocardiogram (ECG). If you will be going home right after the procedure, plan to have someone with you for 24 hours. Plan to have someone: Take you home from the hospital or clinic. Help you at home for the first week after the procedure. What happens during the procedure? To reduce your risk of infection, your health care team will wash or sanitize their hands. Small monitors will be placed on your body to check your heart rate, blood pressure, and oxygen level. An IV tube will be inserted into one of your veins. You will be givena medicine to make you fall asleep (general anesthetic). You may also be given a medicine to help you relax (sedative). A breathing tube will be placed into your lungs. Your back will be cleaned with a germ-killing solution. An incision will be made in your back. The incision may be 2-5 inches (5-13 cm) long, depending on how many vertebrae are being operated on. Muscles in your back will be moved away from the vertebrae and pulled to the  side. Pieces of lamina will be removed. Ligaments and thickened facet joints will be removed. How much tissue and bone is removed depends on how much of the tissue is putting pressure on your nerves. Your nerves will be identified and evaluated to check for excessive tightness. Your back muscles will be moved back into their normal position. The area under your skin will be closed with small, dissolvable stitches (sutures). Your skin will be closed with small, absorbable sutures or staples. A bandage (dressing) will be placed over your incision. The procedure may vary among health care providers and hospitals. What happens after the procedure? Your blood pressure, heart rate, breathing rate, and blood oxygen level will be monitored until the medicines you were given have worn off. You may continue receive medicines and fluids through an IV tube. You will have some back pain. You will be given pain medicine as needed. It is important to be up and moving as  soon as possible after this procedure. Physical therapists will help you start walking. To prevent blood clots in your legs: You may have to wear compression stockings. These stockings also reduce swelling in your legs. You may need to take medicines. You may need to do breathing exercises to help prevent lung infection. Do not drive for 24 hours if you received a sedative. Summary Laminectomy is a procedure that is done to reduce pressure on nerves and the spinal cord and to reduce pain, numbness, and discomfort. If you will be going home right after the procedure, plan to have someone with you for 24 hours. You will have some back pain. You will be given pain medicine as needed. It is important to be up and moving as soon as possible after this procedure. Physical therapists will help you start walking. This information is not intended to replace advice given to you by your health care provider. Make sure you discuss any questions you have  with your health care provider. Document Released: 04/26/2009 Document Revised: 12/23/2015 Document Reviewed: 10/24/2015 Elsevier Interactive Patient Education  2019 [place].   Laminectomy, Care After This sheet gives you information about how to care for yourself after your procedure. Your health care provider may also give you more specific instructions. If you have problems or questions, contact your health care provider. What can I expect after the procedure? After the procedure, it is common to have: Some pain around your incision area. Muscle tightening (spasms) across the back. Follow these instructions at home: Incision care  Follow instructions from your health care provider about how to take care of your incision area. Make sure you: Wash your hands with soap and water before and after you apply medicine to the area or change your bandage (dressing). If soap and water are not available, use hand sanitizer. Change your dressing as told by your health care provider. Leave stitches (sutures), skin glue, or adhesive strips in place. These skin closures may need to stay in place for 2 weeks or longer. If adhesive strip edges start to loosen and curl up, you may trim the loose edges. Do not remove adhesive strips completely unless your health care provider tells you to do that. Check your incision area every day for signs of infection. Check for: More redness, swelling, or pain. More fluid or blood. Warmth. Pus or a bad smell. Medicines Take over-the-counter and prescription medicines only as told by your health care provider. If you were prescribed an antibiotic medicine, use it as told by your health care provider. Do not stop using the antibiotic even if you start to feel better. Bathing Do not take baths, swim, or use a hot tub for 2 weeks, or until your incision has healed completely. If your health care provider approves, you may take showers after your dressing has been  removed. Activity  Return to your normal activities as told by your health care provider. Ask your health care provider what activities are safe for you. Avoid bending or twisting at your waist. Always bend at your knees. Do not sit for more than 20-30 minutes at a time. Lie down or walk between periods of sitting. Do not lift anything that is heavier than 10 lb (4.5 kg) or the limit that your health care provider tells you, until he or she says that it is safe. Do not drive for 2 weeks after your procedure or for as long as your health care provider tells you. Do not drive  or use heavy machinery while taking prescription pain medicine. General instructions To prevent or treat constipation while you are taking prescription pain medicine, your health care provider may recommend that you: Drink enough fluid to keep your urine clear or pale yellow. Take over-the-counter or prescription medicines. Eat foods that are high in fiber, such as fresh fruits and vegetables, whole grains, and beans. Limit foods that are high in fat and processed sugars, such as fried and sweet foods. Do breathing exercises as told. Keep all follow-up visits as told by your health care provider. This is important. Contact a health care provider if: You have more redness, swelling, or pain around your incision area. Your incision feels warm to the touch. You are not able to return to activities or do exercises as told by your health care provider. Get help right away if: You have: More fluid or blood coming from your incision area. Pus or a bad smell coming from your incision area. Chills or a fever. Episodes of dizziness or fainting while standing. You develop a rash. You develop shortness of breath or you have difficulty breathing. You cannot control when you urinate or have a bowel movement. You become weak. You are not able to use your legs. Summary After the procedure, it is common to have some pain around  your incision area. You may also have muscle tightening (spasms) across the back. Follow instructions from your health care provider about how to care for your incision. Do not lift anything that is heavier than 10 lb (4.5 kg) or the limit that your health care provider tells you, until he or she says that it is safe. Contact your health care provider if you have more redness, swelling, or pain around your incision area or if your incision feels warm to the touch. These can be signs of infection. This information is not intended to replace advice given to you by your health care provider. Make sure you discuss any questions you have with your health care provider. Document Released: 11/25/2004 Document Revised: 12/23/2015 Document Reviewed: 10/24/2015 Elsevier Interactive Patient Education  2019 [employer]. se for first week them may start to get out slowly increasing distance up to one mile by 3 weeks post op. Keep incision dry for 3 days, may use tegaderm or similar water impervious dressing.   Discharge to SNF when bed available   Complete by:  As directed    Driving restrictions   Complete by:  As directed    No driving for 6 weeks   Increase activity slowly as tolerated   Complete by:  As directed    Lifting restrictions   Complete by:  As directed    No lifting for 6 weeks       Contact information for follow-up providers    Jessy Oto, MD. Schedule an appointment as soon as possible for a visit in 2 week(s).   Specialty:  Orthopedic Surgery Contact information: [place]  [id number] [phone]            Contact information for after-discharge care    Destination    [facility] .   Service:  Skilled Nursing Contact information: [address] [place] [phone]                   Signed: [name] 07/19/2018, 8:50 AM

## 2018-07-17 NOTE — NC FL2 (Signed)
[place] LEVEL OF CARE SCREENING TOOL     IDENTIFICATION  Patient Name: Travis ERBES Sr. Birthdate: 1951/02/04 Sex: male Admission Date (Current Location): 07/16/2018  [facility] and [place] Number:  Herbalist and Address:  [facility] [place], [place] [address] [place], [place] [address]      Provider Number: [id number]  Attending Physician Name and Address:  Jessy Oto, MD  Relative Name and Phone Number:  Doy Taaffe [phone]    Current Level of Care: Hospital Recommended Level of Care: [place] Prior Approval Number:    Date Approved/Denied:   PASRR Number: [id number]  Discharge Plan: SNF    Current Diagnoses: Patient Active Problem List   Diagnosis Date Noted  . Other spondylosis with radiculopathy, lumbar region 07/16/2018    Class: Chronic  . Status post lumbar laminectomy 07/16/2018  . Status post THR (total hip replacement) 04/17/2018  . Unilateral primary osteoarthritis, right hip   . Morbid obesity with BMI of 40.0-44.9, adult ([place]) 02/28/2018  . Myocardial infarction ([place]) 02/25/2018  . Coronary artery disease involving native coronary artery of native heart 02/25/2018  . PAD (peripheral artery disease) ([place]) 02/25/2018  . S/P BKA (below knee amputation) unilateral, right ([place]) 02/25/2018  . Sacroiliitis ([place]) 02/25/2018  . SOB (shortness of breath) 01/03/2018  . Acute on chronic heart failure ([place]) 01/03/2018  . Severe right groin pain 12/20/2017  . Morbid obesity ([place]) 10/09/2017  . Exposure to sexually transmitted disease (STD) 07/14/2017  . Low back pain 07/13/2017  . Leukoplakia, tongue 01/26/2017  . Acute sinus infection 10/20/2016  . Hyperglycemia 10/20/2016  . Skin lesion 10/20/2016  . S/P unilateral BKA (below knee amputation), right ([place]) 06/14/2016  . Charcot foot due to diabetes mellitus ([place])   . Charcot's arthropathy associated with type 2 diabetes mellitus ([place])  04/11/2016  . Preventative health care 11/05/2015  . Major depression 09/13/2015  . S/P TKR (total knee replacement) bilaterally 09/13/2015  . GERD (gastroesophageal reflux disease) 09/08/2015  . S/P laparoscopic hernia repair 05/11/2015  . PPD positive 04/08/2015  . Benign neoplasm of descending colon   . Benign neoplasm of cecum   . AKI (acute kidney injury) ([place]) 10/18/2014  . Acute blood loss anemia   . Chronic anticoagulation   . Occult blood positive stool 10/17/2014  . General weakness 07/14/2014  . Urinary incontinence 07/14/2014  . Headache(784.0) 10/15/2013  . Spinal stenosis in cervical region 09/26/2013    Class: Chronic  . Spinal stenosis of lumbar region 09/26/2013    Class: Chronic  . Hand joint pain 06/10/2013  . Rotator cuff tear arthropathy of both shoulders 06/10/2013  . Skin lesion of cheek 05/01/2013  . Pain of right thumb 04/03/2013  . Balance disorder 03/12/2013  . Gait disorder 03/12/2013  . Tremor 03/12/2013  . Left hip pain 03/12/2013  . Pre-ulcerative corn or callous 02/06/2013  . Anxiety 11/12/2011  . OSA (obstructive sleep apnea) 11/07/2011  . Bradycardia 10/20/2011  . Insomnia 10/04/2011  . Obesity 01/12/2011  . ERECTILE DYSFUNCTION, ORGANIC 05/30/2010  . [place] DISEASE, LUMBAR 04/19/2010  . SCIATICA, LEFT 04/19/2010  . Chronic pain syndrome 10/27/2009  . Hyperlipidemia 07/15/2009  . Essential hypertension 06/24/2009  . Coronary artery disease involving native coronary artery of native heart without angina pectoris 06/24/2009  . Allergic rhinitis 06/24/2009  . URETHRAL STRICTURE 06/24/2009  . DEGENERATIVE JOINT DISEASE 06/24/2009  . SHOULDER PAIN, BILATERAL 06/24/2009  . FATIGUE 06/24/2009  . NEPHROLITHIASIS, HX  OF 06/24/2009    Orientation RESPIRATION BLADDER Height & Weight     Self, Time, Situation, Place  Normal Continent Weight: (!) 140.6 kg(WEIGHS 310LBS) Height:  6' (182.9 cm)  BEHAVIORAL SYMPTOMS/MOOD NEUROLOGICAL BOWEL  NUTRITION STATUS      Continent Diet(see discharge summary)  AMBULATORY STATUS COMMUNICATION OF NEEDS Skin   Extensive Assist Verbally Surgical wounds(redo hemi lanunectomy )                       Personal Care Assistance Level of Assistance  Bathing, Feeding, Dressing, Total care Bathing Assistance: Limited assistance Feeding assistance: Independent Dressing Assistance: Limited assistance Total Care Assistance: Limited assistance   Functional Limitations Info  Sight, Hearing, Speech Sight Info: Adequate Hearing Info: Adequate Speech Info: Adequate    SPECIAL CARE FACTORS FREQUENCY  PT (By licensed PT), OT (By licensed OT)     PT Frequency: min 5x weekly OT Frequency: min 5x weekly            Contractures Contractures Info: Not present    Additional Factors Info  Code Status, Allergies Code Status Info: full Allergies Info: no known allergies           Current Medications (07/17/2018):  This is the current hospital active medication list Current Facility-Administered Medications  Medication Dose Route Frequency Provider Last Rate Last Dose  . 0.9 %  sodium chloride infusion   Intravenous Continuous Jessy Oto, MD 75 mL/hr at 07/16/18 1214    . 0.9 %  sodium chloride infusion  250 mL Intravenous Continuous Jessy Oto, MD      . acetaminophen (TYLENOL) tablet 650 mg  650 mg Oral Q4H PRN Jessy Oto, MD       Or  . acetaminophen (TYLENOL) suppository 650 mg  650 mg Rectal Q4H PRN Jessy Oto, MD      . alum & mag hydroxide-simeth (MAALOX/MYLANTA) 200-200-20 MG/5ML suspension 30 mL  30 mL Oral Q6H PRN Jessy Oto, MD      . ARIPiprazole (ABILIFY) tablet 5 mg  5 mg Oral QHS Jessy Oto, MD   5 mg at 07/16/18 2109  . aspirin EC tablet 81 mg  81 mg Oral QHS Jessy Oto, MD   81 mg at 07/16/18 2110  . atorvastatin (LIPITOR) tablet 20 mg  20 mg Oral QHS Jessy Oto, MD   20 mg at 07/16/18 2110  . benztropine (COGENTIN) tablet 0.5 mg  0.5 mg  Oral QHS Jessy Oto, MD   0.5 mg at 07/16/18 2121  . bisacodyl (DULCOLAX) EC tablet 5 mg  5 mg Oral Daily PRN Jessy Oto, MD      . clonazePAM [place] [place]) tablet 0.5 mg  0.5 mg Oral QHS Jessy Oto, MD   0.5 mg at 07/16/18 2110  . cyclobenzaprine (FLEXERIL) tablet 5 mg  5 mg Oral TID PRN Jessy Oto, MD   5 mg at 07/16/18 1634  . docusate sodium (COLACE) capsule 100 mg  100 mg Oral BID Jessy Oto, MD   100 mg at 07/17/18 6314  . DULoxetine (CYMBALTA) DR capsule 30 mg  30 mg Oral BID Jessy Oto, MD   30 mg at 07/17/18 9702  . gabapentin (NEURONTIN) capsule 800 mg  800 mg Oral TID Jessy Oto, MD   800 mg at 07/17/18 6378  . hydrochlorothiazide (HYDRODIURIL) tablet 25 mg  25 mg Oral QHS Jessy Oto, MD  25 mg at 07/16/18 2110  . HYDROcodone-acetaminophen (NORCO) 10-325 MG per tablet 1 tablet  1 tablet Oral Q4H PRN Jessy Oto, MD   1 tablet at 07/16/18 1543  . HYDROcodone-acetaminophen (NORCO) 7.5-325 MG per tablet 1 tablet  1 tablet Oral Q6H Jessy Oto, MD   1 tablet at 07/17/18 1215  . Melatonin TABS 18 mg  18 mg Oral QHS Jessy Oto, MD   18 mg at 07/16/18 2122  . menthol-cetylpyridinium (CEPACOL) lozenge 3 mg  1 lozenge Oral PRN Jessy Oto, MD       Or  . phenol (CHLORASEPTIC) mouth spray 1 spray  1 spray Mouth/Throat PRN Jessy Oto, MD      . methocarbamol (ROBAXIN) tablet 500 mg  500 mg Oral Q6H PRN Jessy Oto, MD   500 mg at 07/17/18 3299   Or  . methocarbamol (ROBAXIN) 500 mg in dextrose 5 % 50 mL IVPB  500 mg Intravenous Q6H PRN Jessy Oto, MD      . mirabegron [facility]) tablet 50 mg  50 mg Oral QHS Jessy Oto, MD   50 mg at 07/16/18 2109  . morphine 2 MG/ML injection 1 mg  1 mg Intravenous Q2H PRN Jessy Oto, MD      . ondansetron [facility]) tablet 4 mg  4 mg Oral Q6H PRN Jessy Oto, MD       Or  . ondansetron [facility]) injection 4 mg  4 mg Intravenous Q6H PRN Jessy Oto, MD      . oxyCODONE (Oxy IR/ROXICODONE)  immediate release tablet 10 mg  10 mg Oral Q3H PRN Jessy Oto, MD   10 mg at 07/17/18 2426  . pantoprazole (PROTONIX) EC tablet 40 mg  40 mg Oral QHS Jessy Oto, MD   40 mg at 07/16/18 2124  . polyethylene glycol (MIRALAX / GLYCOLAX) packet 17 g  17 g Oral Daily PRN Jessy Oto, MD      . sodium chloride flush (NS) 0.9 % injection 3 mL  3 mL Intravenous Q12H Jessy Oto, MD   3 mL at 07/16/18 2200  . sodium chloride flush (NS) 0.9 % injection 3 mL  3 mL Intravenous PRN Jessy Oto, MD      . sodium phosphate (FLEET) 7-19 GM/118ML enema 1 enema  1 enema Rectal Once PRN Jessy Oto, MD      . traZODone (DESYREL) tablet 150 mg  150 mg Oral QHS Jessy Oto, MD   150 mg at 07/16/18 2110     Discharge Medications: Please see discharge summary for a list of discharge medications.  Relevant Imaging Results:  Relevant Lab Results:   Additional Information SSN: [id number]  Alberteen Sam, LCSW

## 2018-07-17 NOTE — Progress Notes (Addendum)
     Subjective: 1 Day Post-Op Procedure(s) (LRB): LEFT L4-5 REDO PARTIAL LUMBAR HEMILAMINECTOMY WITH FORAMINOTOMY LEFT L4 (N/A) Left leg without pain, back hurts. No bowel or bladder dysfunction, no foley.  Apparently no help at home but [facility] does not have 3 day stay requirement for transfer to SNF for continued post op rehab as the patient has no help at home. Discussed with Dr. Rockne Menghini Will plan to transfer to SNF today if possible and change status back to observation.   Patient reports pain as moderate.    Objective:   VITALS:  Temp:  [98.2 F (36.8 C)-98.7 F (37.1 C)] 98.7 F (37.1 C) (02/26 1215) Pulse Rate:  [65-80] 65 (02/26 1215) Resp:  [17-18] 17 (02/26 1215) BP: (92-149)/(57-71) 137/63 (02/26 1215) SpO2:  [95 %-99 %] 97 % (02/26 1215)  Neurologically intact ABD soft Neurovascular intact Sensation intact distally Intact pulses distally Dorsiflexion/Plantar flexion intact Incision: dressing C/D/I and no drainage No cellulitis present Compartment soft Right BKA with prosthesis he has been up and to the bathroom with assistance.    LABS Recent Labs    07/15/18 1525 07/17/18 0255  HGB 13.1 11.3*  WBC 6.4 9.2  PLT 258 237   Recent Labs    07/15/18 1525 07/17/18 0255  NA 137 137  K 3.9 3.6  CL 102 101  CO2 22 26  BUN 23 19  CREATININE 1.39* 1.02  GLUCOSE 135* 154*   Recent Labs    07/15/18 1525  INR 1.2     Assessment/Plan: 1 Day Post-Op Procedure(s) (LRB): LEFT L4-5 REDO PARTIAL LUMBAR HEMILAMINECTOMY WITH FORAMINOTOMY LEFT L4 (N/A)  Advance diet Up with therapy D/C IV fluids Discharge to [facility]  [name] 07/17/2018, 1:23 PMPatient ID: Travis [name]., male   DOB: Nov 21, 1950, 68 y.o.   MRN: [id number]

## 2018-07-17 NOTE — Evaluation (Signed)
Occupational Therapy Evaluation Patient Details Name: Travis BERNASCONI Sr. MRN: [id number] DOB: [birth year] Today's Date: 07/17/2018    History of Present Illness Pt is a 68 yo male admitted for left L4/5 laminectomy redo after increasing L LE weakness. PMH including R THA, R BKA, bil TKA, DM, hep C, fibromyalgia, CAD.   Clinical Impression   PTA Pt mod I for mobility and ADL with RW and various DME (including RLE prosthetic). Pt is currently mod A for LB ADL (dressing/bathing) and max A for peri care post-BM (Pt interested in AE for assisting this). Min A +2 safety for transfers from elevated surfaces, min A +2 physical from low surfaces. Pt able to recall 2/3 back precautions - provided re-education. Pt was able to click-in to prosthetic sitting EOB without assist but required total A for all the socks and layers to prep for click in. Pt will require skilled OT in the acute setting and afterwards at the SNF level to maximize safety and independence in ADL and functional transfers. Next session to focus on toilet aide AE and transfers while reinforcing back precautions.     Follow Up Recommendations  SNF    Equipment Recommendations  Other (comment)(defer to next venue - look into AE)    Recommendations for Other Services       Precautions / Restrictions Precautions Precautions: Fall;Back Precaution Booklet Issued: No Precaution Comments: Verbally reviewed back precautions with pt- he was able to remember 2/3 precautions from previous back surgery. Restrictions Weight Bearing Restrictions: No Other Position/Activity Restrictions: no bending or arching back, no twisting, no lifting      Mobility Bed Mobility Overal bed mobility: Needs Assistance Bed Mobility: Rolling;Sidelying to Sit Rolling: Min guard Sidelying to sit: Min guard;HOB elevated       General bed mobility comments: Min guard for safety and to ensure pt maintained precautions- he was able to safely log roll  without twisting and elevate trunk from elevated HOB  Transfers Overall transfer level: Needs assistance Equipment used: Rolling walker (2 wheeled)(R prosthetic limb) Transfers: Sit to/from Stand Sit to Stand: Min guard;Min assist;+2 physical assistance;From elevated surface         General transfer comment: pt able to stand with min guard from elevated bed, but he required minA of 2 to safely sit to a lower chair. Required totalA to set up prosthetic limb for standing while maintaining precautions- once set up and with bed elevated, he was able to don prosthetic limb independently    Balance Overall balance assessment: Needs assistance Sitting-balance support: Feet supported;No upper extremity supported Sitting balance-Leahy Scale: Good Sitting balance - Comments: Pt able to don prosthetic limb while maintaining sitting balance at EOB, min guard for safety     Standing balance-Leahy Scale: Poor Standing balance comment: Pt requires RW to maintain balance in standing and during ambulation.                           ADL either performed or assessed with clinical judgement   ADL Overall ADL's : Needs assistance/impaired Eating/Feeding: Modified independent   Grooming: Modified independent;Sitting   Upper Body Bathing: Moderate assistance;Sitting   Lower Body Bathing: Moderate assistance;Sitting/lateral leans Lower Body Bathing Details (indicate cue type and reason): to maintain back precautions Upper Body Dressing : Set up   Lower Body Dressing: Maximal assistance;Sitting/lateral leans;Sit to/from stand Lower Body Dressing Details (indicate cue type and reason): cues for back precautions, Pt able to  Toilet  Transfer: Minimal assistance;+2 for physical assistance;+2 for safety/equipment;Ambulation;BSC;RW;Requires wide/bariatric   Toileting- Clothing Manipulation and Hygiene: Maximal assistance;+2 for safety/equipment;Sit to/from stand Toileting - Clothing  Manipulation Details (indicate cue type and reason): dependent on BUE for balance     Functional mobility during ADLs: Min guard;Rolling walker(close chair follow) General ADL Comments: decreased access for ADL due to back precautions     Vision         Perception     Praxis      Pertinent Vitals/Pain Pain Assessment: 0-10 Pain Score: 7  Pain Location: low back Pain Descriptors / Indicators: Grimacing;Guarding Pain Intervention(s): Monitored during session;Repositioned     Hand Dominance Right   Extremity/Trunk Assessment Upper Extremity Assessment Upper Extremity Assessment: Overall WFL for tasks assessed   Lower Extremity Assessment Lower Extremity Assessment: Defer to PT evaluation RLE Deficits / Details: R BKA- wears a prosthetic; today residual limb is swollen LLE Deficits / Details: pt states that L LE is generally weaker caused by lumbar stenosis   Cervical / Trunk Assessment Cervical / Trunk Assessment: Other exceptions Cervical / Trunk Exceptions: s/p sx, notable scoliosis with R curve   Communication Communication Communication: No difficulties   Cognition Arousal/Alertness: Awake/alert Behavior During Therapy: WFL for tasks assessed/performed Overall Cognitive Status: Within Functional Limits for tasks assessed                                     General Comments  R LE edema- pt agrees to sleep in prosthetic liner to reduce swelling; he says he will have his socks tomorrow    Exercises     Shoulder Instructions      Home Living Family/patient expects to be discharged to:: Private residence Living Arrangements: Other relatives Available Help at Discharge: Family;Available PRN/intermittently Type of Home: Mobile home Home Access: Stairs to enter Entrance Stairs-Number of Steps: 7 Entrance Stairs-Rails: Can reach both Home Layout: One level     Bathroom Shower/Tub: Occupational psychologist: Standard     Home  Equipment: Financial controller: Reacher;Long-handled sponge Additional Comments: lives with niece and family      Prior Functioning/Environment Level of Independence: Independent with assistive device(s)        Comments: mod I for ADL, uses RW for ambulation, family assists with cooking, cleaning        OT Problem List: Decreased activity tolerance;Impaired balance (sitting and/or standing);Decreased knowledge of precautions;Obesity;Pain;Increased edema      OT Treatment/Interventions: Self-care/ADL training;DME and/or AE instruction;Therapeutic activities;Patient/family education;Balance training    OT Goals(Current goals can be found in the care plan section) Acute Rehab OT Goals Patient Stated Goal: go to rehab so he can be independent before going home OT Goal Formulation: With patient Time For Goal Achievement: 07/31/18 Potential to Achieve Goals: Good ADL Goals Pt Will Perform Lower Body Bathing: with modified independence;with adaptive equipment;sitting/lateral leans Pt Will Perform Lower Body Dressing: with modified independence;with adaptive equipment;sit to/from stand Pt Will Transfer to Toilet: with modified independence;ambulating Pt Will Perform Toileting - Clothing Manipulation and hygiene: with modified independence;sitting/lateral leans;with adaptive equipment Additional ADL Goal #1: Pt will perform bed mobility at mod I level maintaining back precautions prior to engaging in ADL  OT Frequency: Min 2X/week   Barriers to D/C:            Co-evaluation PT/OT/SLP Co-Evaluation/Treatment: Yes Reason for Co-Treatment: Complexity of the patient's impairments (multi-system involvement);For  patient/therapist safety;To address functional/ADL transfers PT goals addressed during session: Mobility/safety with mobility;Balance;Proper use of DME OT goals addressed during session: ADL's and self-care;Proper use of Adaptive equipment and DME      AM-PAC  OT "6 Clicks" Daily Activity     Outcome Measure Help from another person eating meals?: None Help from another person taking care of personal grooming?: A Little Help from another person toileting, which includes using toliet, bedpan, or urinal?: A Little Help from another person bathing (including washing, rinsing, drying)?: A Lot Help from another person to put on and taking off regular upper body clothing?: A Little Help from another person to put on and taking off regular lower body clothing?: A Lot 6 Click Score: 17   End of Session Equipment Utilized During Treatment: Gait belt;Rolling walker;Other (comment)(R prosthetic limb) Nurse Communication: Mobility status;Precautions  Activity Tolerance: Patient tolerated treatment well Patient left: in chair;with call bell/phone within reach;with chair alarm set  OT Visit Diagnosis: Unsteadiness on feet (R26.81);Other abnormalities of gait and mobility (R26.89);Pain Pain - part of body: (back)                Time: 1110-1148 OT Time Calculation (min): 38 min Charges:  OT General Charges $OT Visit: 1 Visit OT Evaluation $OT Eval Moderate Complexity: [place] OTR/L Acute Rehabilitation Services Pager: [phone] Office: [place] 07/17/2018, 2:49 PM

## 2018-07-18 DIAGNOSIS — M4726 Other spondylosis with radiculopathy, lumbar region: Secondary | ICD-10-CM | POA: Diagnosis present

## 2018-07-18 DIAGNOSIS — K219 Gastro-esophageal reflux disease without esophagitis: Secondary | ICD-10-CM | POA: Diagnosis present

## 2018-07-18 DIAGNOSIS — G894 Chronic pain syndrome: Secondary | ICD-10-CM | POA: Diagnosis present

## 2018-07-18 DIAGNOSIS — M48061 Spinal stenosis, lumbar region without neurogenic claudication: Secondary | ICD-10-CM | POA: Diagnosis present

## 2018-07-18 DIAGNOSIS — M4802 Spinal stenosis, cervical region: Secondary | ICD-10-CM | POA: Diagnosis present

## 2018-07-18 DIAGNOSIS — G4733 Obstructive sleep apnea (adult) (pediatric): Secondary | ICD-10-CM | POA: Diagnosis present

## 2018-07-18 DIAGNOSIS — G4700 Insomnia, unspecified: Secondary | ICD-10-CM | POA: Diagnosis present

## 2018-07-18 DIAGNOSIS — Z8349 Family history of other endocrine, nutritional and metabolic diseases: Secondary | ICD-10-CM | POA: Diagnosis not present

## 2018-07-18 DIAGNOSIS — M5430 Sciatica, unspecified side: Secondary | ICD-10-CM | POA: Diagnosis present

## 2018-07-18 DIAGNOSIS — E1151 Type 2 diabetes mellitus with diabetic peripheral angiopathy without gangrene: Secondary | ICD-10-CM | POA: Diagnosis present

## 2018-07-18 DIAGNOSIS — Z833 Family history of diabetes mellitus: Secondary | ICD-10-CM | POA: Diagnosis not present

## 2018-07-18 DIAGNOSIS — I2510 Atherosclerotic heart disease of native coronary artery without angina pectoris: Secondary | ICD-10-CM | POA: Diagnosis present

## 2018-07-18 DIAGNOSIS — F418 Other specified anxiety disorders: Secondary | ICD-10-CM | POA: Diagnosis present

## 2018-07-18 DIAGNOSIS — Z818 Family history of other mental and behavioral disorders: Secondary | ICD-10-CM | POA: Diagnosis not present

## 2018-07-18 DIAGNOSIS — E785 Hyperlipidemia, unspecified: Secondary | ICD-10-CM | POA: Diagnosis present

## 2018-07-18 DIAGNOSIS — M47899 Other spondylosis, site unspecified: Secondary | ICD-10-CM | POA: Diagnosis present

## 2018-07-18 DIAGNOSIS — I252 Old myocardial infarction: Secondary | ICD-10-CM | POA: Diagnosis not present

## 2018-07-18 DIAGNOSIS — R7611 Nonspecific reaction to tuberculin skin test without active tuberculosis: Secondary | ICD-10-CM | POA: Diagnosis present

## 2018-07-18 DIAGNOSIS — Z803 Family history of malignant neoplasm of breast: Secondary | ICD-10-CM | POA: Diagnosis not present

## 2018-07-18 DIAGNOSIS — I10 Essential (primary) hypertension: Secondary | ICD-10-CM | POA: Diagnosis present

## 2018-07-18 DIAGNOSIS — B1920 Unspecified viral hepatitis C without hepatic coma: Secondary | ICD-10-CM | POA: Diagnosis present

## 2018-07-18 DIAGNOSIS — M797 Fibromyalgia: Secondary | ICD-10-CM | POA: Diagnosis present

## 2018-07-18 DIAGNOSIS — Z6841 Body Mass Index (BMI) 40.0 and over, adult: Secondary | ICD-10-CM | POA: Diagnosis not present

## 2018-07-18 MED ORDER — PRASUGREL HCL 10 MG PO TABS
5.0000 mg | ORAL_TABLET | Freq: Every day | ORAL | Status: DC
  Administered 2018-07-18: 5 mg via ORAL
  Filled 2018-07-18: qty 1

## 2018-07-18 NOTE — Social Work (Signed)
CSW has received auth for pt.  Pt will need updated d/c summary with tomorrow's date in order to d/c. Will page MD for this update.  Westley Hummer, MSW, [place] Work [phone]

## 2018-07-18 NOTE — Clinical Social Work Note (Signed)
Clinical Social Work Assessment  Patient Details  Name: Travis TUNGATE Sr. MRN: [id number] Date of Birth: January 02, 1951  Date of referral:  07/17/18               Reason for consult:  Discharge Planning                Permission sought to share information with:  Case Manager, Facility Sport and exercise psychologist Permission granted to share information::  Yes, Verbal Permission Granted  Name::        Agency::  SNFs  Relationship::     Contact Information:     Housing/Transportation Living arrangements for the past 2 months:  [place] of Information:  Patient Patient Interpreter Needed:  None Criminal Activity/Legal Involvement Pertinent to Current Situation/Hospitalization:  No - Comment as needed Significant Relationships:  Siblings Lives with:  Self Do you feel safe going back to the place where you live?  No Need for family participation in patient care:  No (Coment)  Care giving concerns:  CSW received referral for possible SNF placement at time of discharge. Spoke with patient regarding possibility of SNF placement . Patient's  family  is currently unable to care for him at their home given patient's current needs and fall risk.  Patient expressed understanding of PT recommendation and are agreeable to SNF placement at time of discharge. CSW to continue to follow and assist with discharge planning needs.     Social Worker assessment / plan:  Spoke with patien concerning possibility of rehab at SNF before returning home.    Employment status:  Retired Nurse, adult PT Recommendations:  [place] / Referral to community resources:  [place]  Patient/Family's Response to care:  Patient  recognize need for rehab before returning home and are agreeable to a SNF in [place]. They report preference for [facility] . CSW explained insurance authorization process. Patient's family reported that  they want patient to get stronger to be able to come back home.    Patient/Family's Understanding of and Emotional Response to Diagnosis, Current Treatment, and Prognosis:  Patient/family is realistic regarding therapy needs and expressed being hopeful for SNF placement. Patient expressed understanding of CSW role and discharge process as well as medical condition. No questions/concerns about plan or treatment.    Emotional Assessment Appearance:  Appears stated age Attitude/Demeanor/Rapport:  Gracious Affect (typically observed):  Accepting Orientation:  Oriented to Self, Oriented to  Time, Oriented to Place, Oriented to Situation Alcohol / Substance use:  Not Applicable Psych involvement (Current and /or in the community):  No (Comment)  Discharge Needs  Concerns to be addressed:  Discharge Planning Concerns Readmission within the last 30 days:  No Current discharge risk:  Dependent with Mobility Barriers to Discharge:  Continued Medical Work up   [employer], LCSW 07/18/2018, 8:39 AM

## 2018-07-18 NOTE — Progress Notes (Signed)
Physical Therapy Treatment Patient Details Name: Travis MINNIE Sr. MRN: [id number] DOB: 1950/06/19 Today's Date: 07/18/2018    History of Present Illness Pt is a 68 yo male admitted for left L4/5 laminectomy redo after increasing L LE weakness. PMH including R THA, R BKA, bil TKA, DM, hep C, fibromyalgia, CAD.    PT Comments    Patient seen for mobility progression. Pt requires mod A for bed mobility and min/mod A for sit to stand transfers this session. Pt's R LE prosthesis with liner is fitting much better today. Pt with c/o back pain that is centralized to surgical area. Continue to progress as tolerated with anticipated d/c to SNF for further skilled PT services.     Follow Up Recommendations  SNF     Equipment Recommendations  Other (comment)(defer to next venue)    Recommendations for Other Services       Precautions / Restrictions Precautions Precautions: Fall;Back Precaution Comments: needs cues to maintain precautions with mobility    Mobility  Bed Mobility Overal bed mobility: Needs Assistance Bed Mobility: Supine to Sit     Supine to sit: Mod assist     General bed mobility comments: cues for sequencing; pt unable to roll onto L side but prefers to get out on L side this session; assistance required to elevate trunk into sitting while maintaining back precautions; use of rails  Transfers Overall transfer level: Needs assistance Equipment used: Rolling walker (2 wheeled) Transfers: Sit to/from Stand Sit to Stand: Min assist;From elevated surface;Mod assist         General transfer comment: assist to power up into standing from EOB and recliner; increased assist needed from lower surface  Ambulation/Gait Ambulation/Gait assistance: Min guard Gait Distance (Feet): 80 Feet Assistive device: Rolling walker (2 wheeled) Gait Pattern/deviations: Step-through pattern;Wide base of support;Trunk flexed Gait velocity: decreased   General Gait Details: cues  for posture/forward gaze and for sequencing when turing; pt tends to turn RW too far before moving feet and twists back    Stairs             Wheelchair Mobility    Modified Rankin (Stroke Patients Only)       Balance Overall balance assessment: Needs assistance Sitting-balance support: Feet supported;No upper extremity supported Sitting balance-Leahy Scale: Good     Standing balance support: Bilateral upper extremity supported;During functional activity Standing balance-Leahy Scale: Poor                              Cognition Arousal/Alertness: Awake/alert Behavior During Therapy: WFL for tasks assessed/performed Overall Cognitive Status: Within Functional Limits for tasks assessed                                        Exercises      General Comments General comments (skin integrity, edema, etc.): R LE prosthesis with liner fitting well this session; pt needs assistance to don prosthesis sitting EOB       Pertinent Vitals/Pain Pain Assessment: Faces Faces Pain Scale: Hurts little more Pain Location: low back Pain Descriptors / Indicators: Grimacing;Guarding;Sharp Pain Intervention(s): Limited activity within patient's tolerance;Monitored during session;Repositioned    Home Living                      Prior Function  PT Goals (current goals can now be found in the care plan section) Acute Rehab PT Goals Patient Stated Goal: go to rehab so he can be independent before going home Progress towards PT goals: Progressing toward goals    Frequency    Min 3X/week      PT Plan Current plan remains appropriate    Co-evaluation              AM-PAC PT "6 Clicks" Mobility   Outcome Measure  Help needed turning from your back to your side while in a flat bed without using bedrails?: A Lot Help needed moving from lying on your back to sitting on the side of a flat bed without using bedrails?: A  Lot Help needed moving to and from a bed to a chair (including a wheelchair)?: A Lot Help needed standing up from a chair using your arms (e.g., wheelchair or bedside chair)?: A Little Help needed to walk in hospital room?: None Help needed climbing 3-5 steps with a railing? : A Lot 6 Click Score: 15    End of Session Equipment Utilized During Treatment: Gait belt;Other (comment)(R prosthetic limb) Activity Tolerance: Patient tolerated treatment well Patient left: in chair;with call bell/phone within reach Nurse Communication: Mobility status PT Visit Diagnosis: Other abnormalities of gait and mobility (R26.89);Muscle weakness (generalized) (M62.81);Pain Pain - part of body: (back)     Time: 3128-1188 PT Time Calculation (min) (ACUTE ONLY): 27 min  Charges:  $Gait Training: 23-37 mins                     Earney Navy, PTA Acute Rehabilitation Services Pager: [phone] Office: [phone]     Darliss Cheney 07/18/2018, 12:09 PM

## 2018-07-18 NOTE — Progress Notes (Signed)
     Subjective: 2 Days Post-Op Procedure(s) (LRB): LEFT L4-5 REDO PARTIAL LUMBAR HEMILAMINECTOMY WITH FORAMINOTOMY LEFT L4 (N/A) Awake, alert and oriented x 4. Dressing is dry. No left leg pain. Stands with assistance. Balance and coordination are decreased but this is baseline. Social service is working on SNF placement approval.   Patient reports pain as moderate.    Objective:   VITALS:  Temp:  [98.3 F (36.8 C)-99.1 F (37.3 C)] 98.4 F (36.9 C) (02/27 1629) Pulse Rate:  [55-76] 61 (02/27 1629) Resp:  [17-18] 17 (02/27 1629) BP: (120-145)/(58-69) 120/58 (02/27 1629) SpO2:  [70 %-97 %] 96 % (02/27 1629)  Neurologically intact ABD soft Neurovascular intact Sensation intact distally Intact pulses distally Dorsiflexion/Plantar flexion intact Incision: dressing C/D/I and no drainage No cellulitis present   LABS Recent Labs    07/17/18 0255  HGB 11.3*  WBC 9.2  PLT 237   Recent Labs    07/17/18 0255  NA 137  K 3.6  CL 101  CO2 26  BUN 19  CREATININE 1.02  GLUCOSE 154*   No results for input(s): LABPT, INR in the last 72 hours.   Assessment/Plan: 2 Days Post-Op Procedure(s) (LRB): LEFT L4-5 REDO PARTIAL LUMBAR HEMILAMINECTOMY WITH FORAMINOTOMY LEFT L4 (N/A)  Advance diet Up with therapy D/C IV fluids Plan for discharge tomorrow Discharge to SNF  [name] 07/18/2018, 5:17 PMPatient ID: Travis [name]., male   DOB: [birth year], 68 y.o.   MRN: [id number]

## 2018-07-18 NOTE — Progress Notes (Signed)
[facility] accepted patient and started Travis [name] on 2/26. Pending insurance auth for dc.   CSW will continue to follow up.   [place], [place]

## 2018-07-19 ENCOUNTER — Ambulatory Visit: Payer: Self-pay | Admitting: Internal Medicine

## 2018-07-19 DIAGNOSIS — I25118 Atherosclerotic heart disease of native coronary artery with other forms of angina pectoris: Secondary | ICD-10-CM | POA: Diagnosis not present

## 2018-07-19 DIAGNOSIS — I700 Atherosclerosis of aorta: Secondary | ICD-10-CM | POA: Diagnosis not present

## 2018-07-19 DIAGNOSIS — R7611 Nonspecific reaction to tuberculin skin test without active tuberculosis: Secondary | ICD-10-CM | POA: Diagnosis not present

## 2018-07-19 DIAGNOSIS — I2510 Atherosclerotic heart disease of native coronary artery without angina pectoris: Secondary | ICD-10-CM | POA: Diagnosis not present

## 2018-07-19 DIAGNOSIS — F39 Unspecified mood [affective] disorder: Secondary | ICD-10-CM | POA: Diagnosis not present

## 2018-07-19 DIAGNOSIS — I5023 Acute on chronic systolic (congestive) heart failure: Secondary | ICD-10-CM | POA: Diagnosis not present

## 2018-07-19 DIAGNOSIS — R5381 Other malaise: Secondary | ICD-10-CM | POA: Diagnosis not present

## 2018-07-19 DIAGNOSIS — Z4789 Encounter for other orthopedic aftercare: Secondary | ICD-10-CM | POA: Diagnosis not present

## 2018-07-19 DIAGNOSIS — E11618 Type 2 diabetes mellitus with other diabetic arthropathy: Secondary | ICD-10-CM | POA: Diagnosis not present

## 2018-07-19 DIAGNOSIS — Z9889 Other specified postprocedural states: Secondary | ICD-10-CM | POA: Diagnosis not present

## 2018-07-19 DIAGNOSIS — I10 Essential (primary) hypertension: Secondary | ICD-10-CM | POA: Diagnosis not present

## 2018-07-19 DIAGNOSIS — I517 Cardiomegaly: Secondary | ICD-10-CM | POA: Diagnosis not present

## 2018-07-19 DIAGNOSIS — F3489 Other specified persistent mood disorders: Secondary | ICD-10-CM | POA: Diagnosis not present

## 2018-07-19 DIAGNOSIS — F419 Anxiety disorder, unspecified: Secondary | ICD-10-CM | POA: Diagnosis not present

## 2018-07-19 DIAGNOSIS — F339 Major depressive disorder, recurrent, unspecified: Secondary | ICD-10-CM | POA: Diagnosis not present

## 2018-07-19 DIAGNOSIS — G894 Chronic pain syndrome: Secondary | ICD-10-CM | POA: Diagnosis not present

## 2018-07-19 DIAGNOSIS — E1151 Type 2 diabetes mellitus with diabetic peripheral angiopathy without gangrene: Secondary | ICD-10-CM | POA: Diagnosis not present

## 2018-07-19 DIAGNOSIS — G478 Other sleep disorders: Secondary | ICD-10-CM | POA: Diagnosis not present

## 2018-07-19 DIAGNOSIS — M5416 Radiculopathy, lumbar region: Secondary | ICD-10-CM | POA: Diagnosis not present

## 2018-07-19 DIAGNOSIS — N3942 Incontinence without sensory awareness: Secondary | ICD-10-CM | POA: Diagnosis not present

## 2018-07-19 DIAGNOSIS — I739 Peripheral vascular disease, unspecified: Secondary | ICD-10-CM | POA: Diagnosis not present

## 2018-07-19 DIAGNOSIS — R159 Full incontinence of feces: Secondary | ICD-10-CM | POA: Diagnosis not present

## 2018-07-19 DIAGNOSIS — Z89511 Acquired absence of right leg below knee: Secondary | ICD-10-CM | POA: Diagnosis not present

## 2018-07-19 DIAGNOSIS — S88119A Complete traumatic amputation at level between knee and ankle, unspecified lower leg, initial encounter: Secondary | ICD-10-CM | POA: Diagnosis not present

## 2018-07-19 MED ORDER — GABAPENTIN 600 MG PO TABS: 600.0000 mg | ORAL_TABLET | Freq: Three times a day (TID) | ORAL | 3 refills | Status: DC

## 2018-07-19 MED ORDER — GABAPENTIN 300 MG PO CAPS
600.0000 mg | ORAL_CAPSULE | Freq: Three times a day (TID) | ORAL | Status: DC
  Administered 2018-07-19: 600 mg via ORAL
  Filled 2018-07-19: qty 2

## 2018-07-19 NOTE — Progress Notes (Signed)
     Subjective: 3 Days Post-Op Procedure(s) (LRB): LEFT L4-5 REDO PARTIAL LUMBAR HEMILAMINECTOMY WITH FORAMINOTOMY LEFT L4 (N/A) Afibrile, VSS, alert awake and oriented x 4. Restarted anticoagulation prasugrel 5 mg daily. BM 2 days ago, voiding without difficulty. Balance and coordination difficulty, needs to be independent prior to return to home, assistance with ADLs. Has right BKA, recent lumbar surgery.   Patient reports pain as moderate.    Objective:   VITALS:  Temp:  [98.4 F (36.9 C)-99.1 F (37.3 C)] 98.7 F (37.1 C) (02/28 0530) Pulse Rate:  [56-76] 56 (02/28 0530) Resp:  [17-22] 22 (02/28 0530) BP: (120-145)/(58-66) 129/60 (02/28 0530) SpO2:  [95 %-98 %] 95 % (02/28 0530)  Neurologically intact ABD soft Neurovascular intact Sensation intact distally Intact pulses distally Dorsiflexion/Plantar flexion intact Incision: dressing C/D/I and no drainage   LABS Recent Labs    07/17/18 0255  HGB 11.3*  WBC 9.2  PLT 237   Recent Labs    07/17/18 0255  NA 137  K 3.6  CL 101  CO2 26  BUN 19  CREATININE 1.02  GLUCOSE 154*   No results for input(s): LABPT, INR in the last 72 hours.   Assessment/Plan: 3 Days Post-Op Procedure(s) (LRB): LEFT L4-5 REDO PARTIAL LUMBAR HEMILAMINECTOMY WITH FORAMINOTOMY LEFT L4 (N/A)  Advance diet Up with therapy D/C IV fluids Discharge to [facility]  [name] 07/19/2018, 8:29 AMPatient ID: Travis Dyer., male   DOB: [birth year], 68 y.o.   MRN: [id number]

## 2018-07-19 NOTE — Progress Notes (Signed)
Patient will DC YO:Travis [name] Anticipated DC date:07/19/2018 Family notified:[name] Transport AU:[facility]  Per MD patient ready for DC to [place] . RN, patient, patient's family, and facility notified of DC. Discharge Summary sent to facility. RN given number for report [phone]. DC packet on chart. Ambulance transport requested for patient.  CSW signing off.  [place], [place]

## 2018-07-19 NOTE — Progress Notes (Signed)
Report was called to [place] at [facility].  The discharge packet will be sent via [facility].  The patient is aware of the discharge plans.

## 2018-07-19 NOTE — Clinical Social Work Placement (Signed)
   CLINICAL SOCIAL WORK PLACEMENT  NOTE  Date:  07/19/2018  Patient Details  Name: Travis HULBERT Sr. MRN: [id number] Date of Birth: [birth year]  Clinical Social Work is seeking post-discharge placement for this patient at the [place] level of care (*CSW will initial, date and re-position this form in  chart as items are completed):      Patient/family provided with [facility] Work Department's list of facilities offering this level of care within the geographic area requested by the patient (or if unable, by the patient's family).  Yes   Patient/family informed of their freedom to choose among providers that offer the needed level of care, that participate in Medicare, Medicaid or managed care program needed by the patient, have an available bed and are willing to accept the patient.  Yes   Patient/family informed of [facility]'s ownership interest in [facility] and [facility], as well as of the fact that they are under no obligation to receive care at these facilities.  PASRR submitted to [employer] on       PASRR number received on 07/17/18     Existing PASRR number confirmed on       FL2 transmitted to all facilities in geographic area requested by pt/family on 07/17/18     FL2 transmitted to all facilities within larger geographic area on       Patient informed that his/her managed care company has contracts with or will negotiate with certain facilities, including the following:        Yes   Patient/family informed of bed offers received.  Patient chooses bed at [facility]     Physician recommends and patient chooses bed at      Patient to be transferred to [place] on 07/19/18.  Patient to be transferred to facility by [facility]     Patient family notified on 07/19/18 of transfer.  Name of family member notified:  Delfino Lovett (brother)     PHYSICIAN       Additional Comment:     _______________________________________________ Alberteen Sam, LCSW 07/19/2018, 12:42 PM

## 2018-07-19 NOTE — Progress Notes (Signed)
Physical Therapy Treatment Patient Details Name: Travis ARONS Sr. MRN: [id number] DOB: 12-02-1950 Today's Date: 07/19/2018    History of Present Illness Pt is a 68 yo male admitted for left L4/5 laminectomy redo after increasing L LE weakness. PMH including R THA, R BKA, bil TKA, DM, hep C, fibromyalgia, CAD.    PT Comments    Patient seen for mobility progression. Pt reports less pain today and agreeable to therapy. Pt requires min guard/min A for all mobility this session and cues to maintain precautions. Continue to progress as tolerated with anticipated d/c to SNF for further skilled PT services.     Follow Up Recommendations  SNF     Equipment Recommendations  Other (comment)(defer to next venue)    Recommendations for Other Services       Precautions / Restrictions Precautions Precautions: Fall;Back Precaution Comments: pt recalls precautions but needs cues to maintain especially twisting    Mobility  Bed Mobility Overal bed mobility: Needs Assistance Bed Mobility: Supine to Sit;Sit to Supine     Supine to sit: Min guard Sit to supine: Min guard   General bed mobility comments: increased time and effort; min guard for safety  Transfers Overall transfer level: Needs assistance Equipment used: Rolling walker (2 wheeled) Transfers: Sit to/from Stand Sit to Stand: From elevated surface;Min guard         General transfer comment: min guard assist from elevated bed height; cues for safe hand placement  Ambulation/Gait Ambulation/Gait assistance: Min guard Gait Distance (Feet): 100 Feet Assistive device: Rolling walker (2 wheeled) Gait Pattern/deviations: Step-through pattern;Wide base of support;Trunk flexed Gait velocity: decreased   General Gait Details: cues for posture/forward gaze and to decrease twisting with turns   Stairs             Wheelchair Mobility    Modified Rankin (Stroke Patients Only)       Balance Overall balance  assessment: Needs assistance Sitting-balance support: Feet supported;No upper extremity supported Sitting balance-Leahy Scale: Good     Standing balance support: Bilateral upper extremity supported;During functional activity Standing balance-Leahy Scale: Poor                              Cognition Arousal/Alertness: Awake/alert Behavior During Therapy: WFL for tasks assessed/performed Overall Cognitive Status: Within Functional Limits for tasks assessed                                        Exercises      General Comments        Pertinent Vitals/Pain Pain Assessment: Faces Faces Pain Scale: Hurts a little bit Pain Location: back Pain Descriptors / Indicators: Guarding;Sore Pain Intervention(s): Limited activity within patient's tolerance;Monitored during session;Repositioned    Home Living                      Prior Function            PT Goals (current goals can now be found in the care plan section) Acute Rehab PT Goals Patient Stated Goal: go to rehab so he can be independent before going home Progress towards PT goals: Progressing toward goals    Frequency    Min 3X/week      PT Plan Current plan remains appropriate    Co-evaluation  AM-PAC PT "6 Clicks" Mobility   Outcome Measure  Help needed turning from your back to your side while in a flat bed without using bedrails?: A Lot Help needed moving from lying on your back to sitting on the side of a flat bed without using bedrails?: A Lot Help needed moving to and from a bed to a chair (including a wheelchair)?: A Lot Help needed standing up from a chair using your arms (e.g., wheelchair or bedside chair)?: A Little Help needed to walk in hospital room?: None Help needed climbing 3-5 steps with a railing? : A Lot 6 Click Score: 15    End of Session Equipment Utilized During Treatment: Gait belt;Other (comment)(R prosthetic limb) Activity  Tolerance: Patient tolerated treatment well Patient left: with call bell/phone within reach;in bed Nurse Communication: Mobility status PT Visit Diagnosis: Other abnormalities of gait and mobility (R26.89);Muscle weakness (generalized) (M62.81);Pain Pain - part of body: (back)     Time: 8984-2103 PT Time Calculation (min) (ACUTE ONLY): 22 min  Charges:  $Gait Training: 8-22 mins                     Travis [name], PTA Acute Rehabilitation Services Pager: [phone] Office: [phone]     Darliss Cheney 07/19/2018, 12:02 PM

## 2018-07-19 NOTE — Plan of Care (Signed)
  Problem: Clinical Measurements: Goal: Will remain free from infection Outcome: Progressing Goal: Cardiovascular complication will be avoided Outcome: Progressing   Problem: Activity: Goal: Risk for activity intolerance will decrease Outcome: Progressing   Problem: Coping: Goal: Level of anxiety will decrease Outcome: Progressing   Problem: Pain Managment: Goal: General experience of comfort will improve Outcome: Progressing   Problem: Safety: Goal: Ability to remain free from injury will improve Outcome: Progressing

## 2018-07-20 ENCOUNTER — Other Ambulatory Visit (HOSPITAL_COMMUNITY): Payer: Self-pay

## 2018-07-20 DIAGNOSIS — F319 Bipolar disorder, unspecified: Secondary | ICD-10-CM

## 2018-07-20 MED ORDER — DULOXETINE HCL 30 MG PO CPEP: 30.0000 mg | ORAL_CAPSULE | Freq: Two times a day (BID) | ORAL | 0 refills | Status: DC

## 2018-07-20 MED ORDER — TRAZODONE HCL 150 MG PO TABS: 150.0000 mg | ORAL_TABLET | Freq: Every day | ORAL | 0 refills | Status: DC

## 2018-07-20 NOTE — Progress Notes (Signed)
Patient will be out of this medication on 07-30-2018. Next appointment is 08-14-2018

## 2018-07-22 ENCOUNTER — Telehealth: Payer: Self-pay | Admitting: *Deleted

## 2018-07-22 DIAGNOSIS — M5416 Radiculopathy, lumbar region: Secondary | ICD-10-CM | POA: Diagnosis not present

## 2018-07-22 DIAGNOSIS — I25118 Atherosclerotic heart disease of native coronary artery with other forms of angina pectoris: Secondary | ICD-10-CM | POA: Diagnosis not present

## 2018-07-22 DIAGNOSIS — E1151 Type 2 diabetes mellitus with diabetic peripheral angiopathy without gangrene: Secondary | ICD-10-CM | POA: Diagnosis not present

## 2018-07-22 DIAGNOSIS — F39 Unspecified mood [affective] disorder: Secondary | ICD-10-CM | POA: Diagnosis not present

## 2018-07-22 DIAGNOSIS — Z9889 Other specified postprocedural states: Secondary | ICD-10-CM | POA: Diagnosis not present

## 2018-07-22 DIAGNOSIS — G478 Other sleep disorders: Secondary | ICD-10-CM | POA: Diagnosis not present

## 2018-07-22 DIAGNOSIS — S88119A Complete traumatic amputation at level between knee and ankle, unspecified lower leg, initial encounter: Secondary | ICD-10-CM | POA: Diagnosis not present

## 2018-07-22 NOTE — Telephone Encounter (Signed)
Pt was on patient ping admitted 07/16/18 for spondylosis with radiculopathy, lumbar region. Pt underwent LEFT L4-5 REDO PARTIAL LUMBAR HEMILAMINECTOMY WITH FORAMINOTOMY LEFT L4 on 07/16/2018. Procedure went well, and pt D/C 07/19/18 to SNF. Pt will f/u w/surgeon once he has been release from SNF.Marland KitchenJohny Chess

## 2018-07-25 ENCOUNTER — Ambulatory Visit (INDEPENDENT_AMBULATORY_CARE_PROVIDER_SITE_OTHER): Payer: Medicare HMO | Admitting: Internal Medicine

## 2018-07-25 ENCOUNTER — Encounter: Payer: Self-pay | Admitting: Internal Medicine

## 2018-07-25 VITALS — BP 128/84 | HR 61 | Ht 72.0 in | Wt 327.0 lb

## 2018-07-25 DIAGNOSIS — R159 Full incontinence of feces: Secondary | ICD-10-CM | POA: Diagnosis not present

## 2018-07-25 DIAGNOSIS — I10 Essential (primary) hypertension: Secondary | ICD-10-CM | POA: Diagnosis not present

## 2018-07-25 DIAGNOSIS — N3942 Incontinence without sensory awareness: Secondary | ICD-10-CM | POA: Diagnosis not present

## 2018-07-25 NOTE — Assessment & Plan Note (Signed)
Etiology unclear, apparently not related to spine disease/neurological, to consider immodium but risks constipation; for GI referral

## 2018-07-25 NOTE — Assessment & Plan Note (Signed)
stable overall by history and exam, recent data reviewed with pt, and pt to continue medical treatment as before,  to f/u any worsening symptoms or concerns  

## 2018-07-25 NOTE — Patient Instructions (Addendum)
You will be contacted regarding the referral for: Gastroenterology  OK to try immodium as needed  Please continue all other medications as before, and refills have been done if requested.  Please have the pharmacy call with any other refills you may need.  Please keep your appointments with your specialists as you may have planned  Please return in 6 months, or sooner if needed

## 2018-07-25 NOTE — Progress Notes (Signed)
Subjective:    Patient ID: Travis [name]., male    DOB: 1951-05-22, 68 y.o.   MRN: [id number]  HPI  Here to f/u - Currently staying temporarily at rehab facility after back surgury., was awakened at 5am for a pain med, but was putting on the leg to the BR and had bowel incontinence (and bladder but plans to see urology next wk as planned)  Has been happening 3 times per mo for 5 months, not related to spine issue per orthopedic. Soft brown stool per pt, no fever, pain, n/v or blood or melena or mucous.  Wearing pullups at night.  Plans to conisder trying immodium. Wt Readings from Last 3 Encounters:  07/25/18 (!) 327 lb (148.3 kg)  07/16/18 (!) 310 lb (140.6 kg)  07/15/18 230 lb (104.3 kg)  Pt denies chest pain, increased sob or doe, wheezing, orthopnea, PND, increased LE swelling, palpitations, dizziness or syncope. Pt denies new neurological symptoms such as new headache, or facial or extremity weakness or numbness   Pt denies polydipsia, polyuria Past Medical History:  Diagnosis Date  . ALLERGIC RHINITIS 06/24/2009  . Anxiety 11/12/2011   Adequate for discharge   . Arthritis    "all my joints" (09/30/2013)  . Arthritis of foot, right, degenerative 04/15/2014  . Balance disorder 03/12/2013  . Benign neoplasm of cecum   . Benign neoplasm of descending colon   . CAD (coronary artery disease) 06/24/2009   5 stents placed in 2007    . Chronic anticoagulation   . Chronic pain syndrome 10/27/2009   of ankle, shoulders, low back.  sciatica.   . Closed fracture of right foot 10/17/2014  . CORONARY ARTERY DISEASE 06/24/2009   a. s/p multiple PCIs - In 2008 he had a Taxus DES to the mild LAD, Endeavor DES to mid LCX and distal LCX. In January 2009 he had DES to distal LCX, mid LCX and proximal LCX. In November 2009 had BMS x 2 to the mid RCA. Cath 10/2011 with patent stents, noncardiac CP. LHC 01/2013: patent stents (noncardiac CP).  . DEGENERATIVE JOINT DISEASE 06/24/2009   Qualifier: Diagnosis of   By: [name], [name]   . Depression   . Depression with anxiety    Prior suicide attempt  . [place] DISEASE, LUMBAR 04/19/2010  . ERECTILE DYSFUNCTION, ORGANIC 05/30/2010  . Essential hypertension 06/24/2009   Qualifier: Diagnosis of  By: [name], [name]   . Fibromyalgia   . Fracture dislocation of ankle joint 09/02/2015  . Gait disorder 03/12/2013  . General weakness 07/14/2014  . GERD (gastroesophageal reflux disease) 09/08/2015  . Hand joint pain 06/10/2013  . Heart murmur   . Hepatitis C   . History of kidney stones   . Hyperlipidemia 07/15/2009   Qualifier: Diagnosis of  By: Aundra Dubin, MD, [place]    . HYPERLIPIDEMIA-MIXED 07/15/2009  . HYPERTENSION 06/24/2009  . Insomnia 10/04/2011  . Irregular heart beat   . Left hip pain 03/12/2013   Injected under ultrasound guidance on June 24, 2013   . Major depression 09/13/2015  . Myocardial infarction ([place]) 2008  . Non-cardiac chest pain 10/2011, 01/2013  . Obesity   . Occult blood positive stool 10/17/2014  . Open ankle fracture 09/02/2015  . OSA (obstructive sleep apnea)    not using CPAP (09/30/2013)  . Pain of right thumb 04/03/2013  . Pneumonia   . PPD positive 04/08/2015  . Pre-ulcerative corn or callous 02/06/2013  . Rotator cuff tear arthropathy of  both shoulders 06/10/2013   History of bilateral shoulder cuff surgery for rotator cuff tears. Reports increase in pain 09/11/2015 during physical therapy of the left shoulder.   [name] SCIATICA, LEFT 04/19/2010   Qualifier: Diagnosis of  By: [name], [name]   . Spinal stenosis in cervical region 09/26/2013  . Spinal stenosis, lumbar region, with neurogenic claudication 09/26/2013  . Type II diabetes mellitus ([place]) 2012   no meds in 09/2014.   [name] Uncontrolled type 2 DM with peripheral circulatory disorder ([place]) 10/04/2013  . URETHRAL STRICTURE 06/24/2009   self catheterizes.    Past Surgical History:  Procedure Laterality Date  . AMPUTATION Right 06/14/2016   Procedure: AMPUTATION BELOW KNEE;  Surgeon:  Newt Minion, MD;  Location: [facility];  Service: Orthopedics;  Laterality: Right;  . ANKLE FUSION Right 04/15/2014   Procedure: Right Subtalar, Talonavicular Fusion;  Surgeon: Newt Minion, MD;  Location: [facility];  Service: Orthopedics;  Laterality: Right;  . ANKLE FUSION Right 04/18/2016   Procedure: Right Ankle Tibiocalcaneal Fusion;  Surgeon: Newt Minion, MD;  Location: [facility];  Service: Orthopedics;  Laterality: Right;  . ANTERIOR CERVICAL DECOMP/DISCECTOMY FUSION N/A 09/26/2013   Procedure: ANTERIOR CERVICAL DISCECTOMY FUSION C3-4, plate and screw fixation, allograft bone graft;  Surgeon: Jessy Oto, MD;  Location: [facility];  Service: Orthopedics;  Laterality: N/A;  . BACK SURGERY    . BELOW KNEE LEG AMPUTATION Right 06/14/2016  . CARDIAC CATHETERIZATION  X 1  . CARPAL TUNNEL RELEASE Bilateral   . COLONOSCOPY    . COLONOSCOPY N/A 10/22/2014   Procedure: COLONOSCOPY;  Surgeon: Lafayette Dragon, MD;  [facility] ENDOSCOPY;  Service: Endoscopy;  Laterality: N/A;  . CORONARY ANGIOPLASTY WITH STENT PLACEMENT     "I have 9 stents"  . ESOPHAGOGASTRODUODENOSCOPY N/A 10/19/2014   Procedure: ESOPHAGOGASTRODUODENOSCOPY (EGD);  Surgeon: Jerene Bears, MD;  Location: [facility] ENDOSCOPY;  Service: Endoscopy;  Laterality: N/A;  . FRACTURE SURGERY    . FUSION OF TALONAVICULAR JOINT Right 04/15/2014   dr duda  . HERNIA REPAIR     umbilical  . INGUINAL HERNIA REPAIR Right 05/11/2015   Procedure: LAPAROSCOPIC REPAIR RIGHT  INGUINAL HERNIA;  Surgeon: Greer Pickerel, MD;  Location: [place];  Service: General;  Laterality: Right;  . INSERTION OF MESH Right 05/11/2015   Procedure: INSERTION OF MESH;  Surgeon: Greer Pickerel, MD;  Location: [place];  Service: General;  Laterality: Right;  . JOINT REPLACEMENT    . KNEE CARTILAGE SURGERY Right X 12   "~ 1/2 open; ~ 1/2 scopes"  . KNEE CARTILAGE SURGERY Left X 3   "3 scopes"  . LEFT HEART CATHETERIZATION WITH CORONARY ANGIOGRAM N/A 02/10/2013   Procedure: LEFT HEART CATHETERIZATION  WITH CORONARY ANGIOGRAM;  Surgeon: Burnell Blanks, MD;  Location: [facility] CATH LAB;  Service: Cardiovascular;  Laterality: N/A;  . LUMBAR LAMINECTOMY N/A 07/16/2018   Procedure: LEFT L4-5 REDO PARTIAL LUMBAR HEMILAMINECTOMY WITH FORAMINOTOMY LEFT L4;  Surgeon: Jessy Oto, MD;  Location: [place];  Service: Orthopedics;  Laterality: N/A;  . LUMBAR LAMINECTOMY/DECOMPRESSION MICRODISCECTOMY N/A 01/27/2014   Procedure: CENTRAL LUMBAR LAMINECTOMY L4-5 AND L3-4;  Surgeon: Jessy Oto, MD;  Location: [place];  Service: Orthopedics;  Laterality: N/A;  . ORIF ANKLE FRACTURE Right 09/02/2015   Procedure: OPEN REDUCTION INTERNAL FIXATION (ORIF) ANKLE FRACTURE;  Surgeon: Newt Minion, MD;  Location: [facility];  Service: Orthopedics;  Laterality: Right;  . PERIPHERALLY INSERTED CENTRAL CATHETER INSERTION  09/02/2015  .  SHOULDER ARTHROSCOPY W/ ROTATOR CUFF REPAIR Bilateral    "3 on the right; 1 on the left"  . SKIN SPLIT GRAFT Right 10/01/2015   Procedure: RIGHT ANKLE APPLY SKIN GRAFT SPLIT THICKNESS;  Surgeon: Newt Minion, MD;  Location: [facility];  Service: Orthopedics;  Laterality: Right;  . TONSILLECTOMY    . TOOTH EXTRACTION    . TOTAL HIP ARTHROPLASTY Right 04/17/2018  . TOTAL HIP ARTHROPLASTY Right 04/17/2018   Procedure: RIGHT TOTAL HIP ARTHROPLASTY;  Surgeon: Newt Minion, MD;  Location: [facility];  Service: Orthopedics;  Laterality: Right;  . TOTAL KNEE ARTHROPLASTY Bilateral 2008  . UMBILICAL HERNIA REPAIR     UHR  . URETHRAL DILATION  X 4  . VASECTOMY    . WISDOM TOOTH EXTRACTION      reports that he quit smoking about 7 years ago. His smoking use included cigars. He has never used smokeless tobacco. He reports current alcohol use. He reports that he does not use drugs. family history includes Breast cancer in his mother; Coronary artery disease in an other family member; Depression in his brother, mother, and another family member; Diabetes in his father; Early death in his maternal grandfather and  paternal grandfather; Healthy in his son and son; Heart attack (age of onset: 66) in his maternal grandfather; Heart disease in his father, maternal grandfather, and mother; Hyperlipidemia in his father; Hypertension in his brother, father, mother, and another family member; Prostate cancer in his father; Skin cancer in his father. No Known Allergies Current Outpatient Medications on File Prior to Visit  Medication Sig Dispense Refill  . amoxicillin (AMOXIL) 500 MG capsule Take 2,000 mg by mouth See admin instructions. Take 2000 mg by mouth 1 hour prior to dental appointment    . ARIPiprazole (ABILIFY) 5 MG tablet Take 1 tablet (5 mg total) by mouth at bedtime. 30 tablet 3  . aspirin 81 MG tablet Take 81 mg by mouth at bedtime.     [name] atorvastatin (LIPITOR) 20 MG tablet Take 1 tablet (20 mg total) by mouth daily. (Patient taking differently: Take 20 mg by mouth at bedtime. ) 90 tablet 3  . benztropine (COGENTIN) 0.5 MG tablet Take 1 tablet (0.5 mg total) by mouth at bedtime. 90 tablet 0  . clonazePAM (KLONOPIN) 0.5 MG tablet Take 1 tablet (0.5 mg total) by mouth at bedtime. 15 tablet 0  . cyclobenzaprine (FLEXERIL) 5 MG tablet Take 1 tablet (5 mg total) by mouth 3 (three) times daily for 30 days. TAKE ONE TABLET BY MOUTH THREE TIMES DAILY AS NEEDED muscle spasm 90 tablet 0  . DULoxetine (CYMBALTA) 30 MG capsule Take 1 capsule (30 mg total) by mouth 2 (two) times daily. 60 capsule 0  . gabapentin (NEURONTIN) 600 MG tablet Take 1 tablet (600 mg total) by mouth 3 (three) times daily. 90 tablet 3  . hydrochlorothiazide (HYDRODIURIL) 25 MG tablet Take 25 mg by mouth at bedtime.     . Melatonin 5 MG TABS Take 20 mg by mouth at bedtime.     . mirabegron ER (MYRBETRIQ) 50 MG TB24 tablet Take 50 mg by mouth at bedtime.     . Omega-3 Fatty Acids (FISH OIL) 1000 MG CAPS Take 1,000 mg by mouth at bedtime.     [name] oxyCODONE-acetaminophen (PERCOCET/ROXICET) 5-325 MG tablet Take 1-2 tablets by mouth every 4 (four)  hours as needed. 40 tablet 0  . pantoprazole (PROTONIX) 40 MG tablet TAKE ONE TABLET BY MOUTH EVERY DAY (Patient taking  differently: Take 40 mg by mouth at bedtime. ) 90 tablet 1  . prasugrel (EFFIENT) 5 MG TABS tablet TAKE ONE TABLET BY MOUTH EVERY DAY (Patient taking differently: Take 5 mg by mouth at bedtime. ) 90 tablet 3  . traZODone (DESYREL) 150 MG tablet Take 1 tablet (150 mg total) by mouth at bedtime. 30 tablet 0   No current facility-administered medications on file prior to visit.    Review of Systems  Constitutional: Negative for other unusual diaphoresis or sweats HENT: Negative for ear discharge or swelling Eyes: Negative for other worsening visual disturbances Respiratory: Negative for stridor or other swelling  Gastrointestinal: Negative for worsening distension or other blood Genitourinary: Negative for retention or other urinary change Musculoskeletal: Negative for other MSK pain or swelling Skin: Negative for color change or other new lesions Neurological: Negative for worsening tremors and other numbness  Psychiatric/Behavioral: Negative for worsening agitation or other fatigue All other system neg per pt    Objective:   Physical Exam BP 128/84   Pulse 61   Ht 6' (1.829 m)   Wt (!) 327 lb (148.3 kg)   SpO2 96%   BMI 44.35 kg/m  VS noted, morbid obese, non toxic Constitutional: Pt appears in NAD HENT: Head: NCAT.  Right Ear: External ear normal.  Left Ear: External ear normal.  Eyes: . Pupils are equal, round, and reactive to light. Conjunctivae and EOM are normal Nose: without d/c or deformity Neck: Neck supple. Gross normal ROM Cardiovascular: Normal rate and regular rhythm.   Pulmonary/Chest: Effort normal and breath sounds without rales or wheezing.  Abd:  Soft, NT, ND, + BS, no organomegaly Neurological: Pt is alert. At baseline orientation, motor grossly intact except for RLE prosthesis Skin: Skin is warm. No rashes, other new lesions, no LE  edema Psychiatric: Pt behavior is normal without agitation  No other exam findings Lab Results  Component Value Date   WBC 9.2 07/17/2018   HGB 11.3 (L) 07/17/2018   HCT 35.4 (L) 07/17/2018   PLT 237 07/17/2018   GLUCOSE 154 (H) 07/17/2018   CHOL 124 06/03/2018   TRIG 119.0 06/03/2018   HDL 39.00 (L) 06/03/2018   LDLCALC 61 06/03/2018   ALT 25 07/15/2018   AST 28 07/15/2018   NA 137 07/17/2018   K 3.6 07/17/2018   CL 101 07/17/2018   CREATININE 1.02 07/17/2018   BUN 19 07/17/2018   CO2 26 07/17/2018   TSH 1.69 06/03/2018   PSA 0.73 06/03/2018   INR 1.2 07/15/2018   HGBA1C 5.2 06/03/2018   MICROALBUR 1.6 06/03/2018       Assessment & Plan:

## 2018-07-25 NOTE — Assessment & Plan Note (Signed)
Declines UA today, plans to f/u with urology next wk

## 2018-07-26 DIAGNOSIS — M5416 Radiculopathy, lumbar region: Secondary | ICD-10-CM | POA: Diagnosis not present

## 2018-07-26 DIAGNOSIS — Z89511 Acquired absence of right leg below knee: Secondary | ICD-10-CM | POA: Diagnosis not present

## 2018-07-26 DIAGNOSIS — I25118 Atherosclerotic heart disease of native coronary artery with other forms of angina pectoris: Secondary | ICD-10-CM | POA: Diagnosis not present

## 2018-07-26 DIAGNOSIS — F3489 Other specified persistent mood disorders: Secondary | ICD-10-CM | POA: Diagnosis not present

## 2018-07-26 DIAGNOSIS — R5381 Other malaise: Secondary | ICD-10-CM | POA: Diagnosis not present

## 2018-07-26 DIAGNOSIS — G894 Chronic pain syndrome: Secondary | ICD-10-CM | POA: Diagnosis not present

## 2018-07-31 ENCOUNTER — Telehealth (INDEPENDENT_AMBULATORY_CARE_PROVIDER_SITE_OTHER): Payer: Self-pay

## 2018-07-31 NOTE — Telephone Encounter (Signed)
Ok noted  

## 2018-07-31 NOTE — Telephone Encounter (Signed)
Kissa from [facility] called and wanted to let Dr Louanne Skye know they will be able to get HHPT started 08/02/2018. If you have questions or concerns call them at  [phone]

## 2018-08-01 ENCOUNTER — Ambulatory Visit (INDEPENDENT_AMBULATORY_CARE_PROVIDER_SITE_OTHER): Payer: Self-pay

## 2018-08-01 ENCOUNTER — Encounter (INDEPENDENT_AMBULATORY_CARE_PROVIDER_SITE_OTHER): Payer: Self-pay | Admitting: Surgery

## 2018-08-01 ENCOUNTER — Ambulatory Visit (INDEPENDENT_AMBULATORY_CARE_PROVIDER_SITE_OTHER): Payer: Medicare HMO | Admitting: Surgery

## 2018-08-01 ENCOUNTER — Other Ambulatory Visit: Payer: Self-pay | Admitting: *Deleted

## 2018-08-01 ENCOUNTER — Other Ambulatory Visit: Payer: Self-pay

## 2018-08-01 DIAGNOSIS — R6889 Other general symptoms and signs: Secondary | ICD-10-CM | POA: Diagnosis not present

## 2018-08-01 DIAGNOSIS — M48062 Spinal stenosis, lumbar region with neurogenic claudication: Secondary | ICD-10-CM

## 2018-08-01 NOTE — Patient Outreach (Addendum)
[facility]) Care Management  08/01/2018  MAURICE FOTHERINGHAM Sr. [birth year] [id number]   Transition of Care Referral   Referral Date: 07/30/18 Referral Source:  [facility] inpatient referral  Date of Admission: 07/20/18 Date of Discharge:Facility: [facility] on 07/27/18.   Dx s/p left L4-5 redo partial lumbar hemilaminectomy with foraminotomy Left L4 (N/A) Other spondylosis with radiculopathy, lumbar region  Insurance: [place] attempt # 1 successful  Patient is able to verify HIPAA Reviewed and addressed Transitional of care referral with patient  Mr Errico reports he is home after only being a [place] for a week for rehab. He reports today he is in constant pain He reports a pain level at an 8 and when he move his feet and legs he reports the pain goes up to 10   He confirms he was seen by Ricard Dillon, NP and Dr Louanne Skye MD 08/01/18 who thought he was still at the facility  stats should not d/c He reports they are working on changes with his oxycodone and pain management services He reports he is already being followed by a pain management provider via the [place] system, Dr Wilford Grist  He confirms he was discharged home with [facility] He report the "OT" staff will be visiting on 08/02/18   He denies any other medical needs today except pain that his providers are aware of and working on already.  He informs [facility] RN CM he wants to return to a facility to receive more days of rehab vs home health "only one to two days". CM discussed with him that [facility] staff can evaluate him and share information with his providers. CM discussed that he needs to speak with his providers and if a need is determine the MD may be able to assist with a referral for further snf rehab   Social: Mr Wombles informed CM he has support at home He reports he is managing at home. He reports  he receives transportation assistance to medical appointments from [place]. He reports he is riding in lift car.  He reports receiving Meals on wheels He confirms he has a Radio producer, walker, elevated toilet seat and a tub bench  He reports he is on social security disability and has medicaid of [place] plus [employer] services   Conditions: s/p left L4-5 redo partial lumbar hemilaminectomy with foraminotomy Left L4 (N/A) for recurrent spinal stenosis L4-5 with left L4 foraminal stenosis, chronic pain syndrome of ankle, shoulders, low back, January 2020 right total hip replacement, allergic rhinitis, anxiety, depression, bipolar 1 disorder, Urinary incontinence, arthritis of right foot with hx of Fx of right food 10/17/2014, balance disorder, CAD with 5 stents placed in 2007, erectile dysfunction, fibromyalgia,  GERD, heart murmur, hepatitis C hx of kidney stones HDL, MI (2008), Uncontrolled type 2 DM with peripheral circulatory disorder 10/04/2013, OSA, hx of PNA obesity   Medications: He denies concerns with taking medications as prescribed, affording medications, side effects of medications and questions about medications   Appointments: 08/05/18 Dr Wilford Grist pain management MD 08/14/18 Dr Adele Schilder, psychiatrist, 08/29/18 Dr Louanne Skye orthopedic MD   Advance directives: Denies need for assist with advance directives   Consent: [facility] RN CM reviewed [facility] services with patient. Patient gave verbal consent for services. Advised patient that other post discharge calls may occur to assess how the patient is doing following the recent hospitalization. Patient voiced understanding and  was appreciative of f/u call.  Plan: [facility] RN CM will route note to MDs  [facility] RN CM will close case at this time as patient has been assessed and he reports he has spoken with his providers about his needs   Pt encouraged to return a call to [place] CM prn  [facility] RN CM sent a successful outreach letter as discussed with [facility] brochure enclosed  for review   Iver Fehrenbach L. Lavina Hamman, RN, BSN, [place] Management Care Coordinator Direct Number [phone] Mobile number [phone]  Main [facility] number [phone] Fax number [phone]

## 2018-08-01 NOTE — Progress Notes (Signed)
Post-Op Visit Note   Patient: Travis JALOMO Sr.           Date of Birth: 11-16-1950           MRN: [id number] Visit Date: 08/01/2018 PCP: [name], MD   Assessment & Plan:  Chief Complaint:  Chief Complaint  Patient presents with  . Lower Back - Pain  Patient returns.  Status postLEFT L4-5 REDO PARTIAL LUMBAR HEMILAMINECTOMY WITH FORAMINOTOMY LEFT L4.   he is about 3 weeks postop.  States that his preop left buttock pain is gone but he has been having increased pain in his central low back since his surgery.  This is worse than preop.  No injury.  He does state that he was discharged from the skilled facility after a week.  He does not have really any help at home.  He does have a nephew that drives him to the store for groceries when he needs to. Visit Diagnoses:  1. Spinal stenosis of lumbar region with neurogenic claudication     Plan: We feel the patient was prematurely discharged from the skilled nurse facility.  He does not have any assistance at home.  We will see if we can get a hold of his pain management doctor to see if we can temporarily increase his pain medication.  Follow-up with Dr. Louanne Skye in 3 weeks for recheck.  Follow-Up Instructions: Return in about 3 weeks (around 08/22/2018) for with Dr Louanne Skye.   Orders:  Orders Placed This Encounter  Procedures  . XR Lumbar Spine 2-3 Views   No orders of the defined types were placed in this encounter.   Imaging: No results found.  PMFS History: Patient Active Problem List   Diagnosis Date Noted  . Bowel incontinence 07/25/2018  . Other spondylosis with radiculopathy, lumbar region 07/16/2018    Class: Chronic  . Status post lumbar laminectomy 07/16/2018  . Status post THR (total hip replacement) 04/17/2018  . Unilateral primary osteoarthritis, right hip   . Morbid obesity with BMI of 40.0-44.9, adult ([place]) 02/28/2018  . Myocardial infarction ([place]) 02/25/2018  . Coronary artery disease involving native  coronary artery of native heart 02/25/2018  . PAD (peripheral artery disease) ([place]) 02/25/2018  . S/P BKA (below knee amputation) unilateral, right ([place]) 02/25/2018  . Sacroiliitis ([place]) 02/25/2018  . SOB (shortness of breath) 01/03/2018  . Acute on chronic heart failure ([place]) 01/03/2018  . Severe right groin pain 12/20/2017  . Morbid obesity ([place]) 10/09/2017  . Exposure to sexually transmitted disease (STD) 07/14/2017  . Low back pain 07/13/2017  . Leukoplakia, tongue 01/26/2017  . Acute sinus infection 10/20/2016  . Hyperglycemia 10/20/2016  . Skin lesion 10/20/2016  . S/P unilateral BKA (below knee amputation), right ([place]) 06/14/2016  . Charcot foot due to diabetes mellitus ([place])   . Charcot's arthropathy associated with type 2 diabetes mellitus ([place]) 04/11/2016  . Preventative health care 11/05/2015  . Major depression 09/13/2015  . S/P TKR (total knee replacement) bilaterally 09/13/2015  . GERD (gastroesophageal reflux disease) 09/08/2015  . S/P laparoscopic hernia repair 05/11/2015  . PPD positive 04/08/2015  . Benign neoplasm of descending colon   . Benign neoplasm of cecum   . AKI (acute kidney injury) ([place]) 10/18/2014  . Acute blood loss anemia   . Chronic anticoagulation   . Occult blood positive stool 10/17/2014  . General weakness 07/14/2014  . Urinary incontinence 07/14/2014  . Headache(784.0) 10/15/2013  . Spinal stenosis in cervical region 09/26/2013  Class: Chronic  . Spinal stenosis of lumbar region 09/26/2013    Class: Chronic  . Hand joint pain 06/10/2013  . Rotator cuff tear arthropathy of both shoulders 06/10/2013  . Skin lesion of cheek 05/01/2013  . Pain of right thumb 04/03/2013  . Balance disorder 03/12/2013  . Gait disorder 03/12/2013  . Tremor 03/12/2013  . Left hip pain 03/12/2013  . Pre-ulcerative corn or callous 02/06/2013  . Anxiety 11/12/2011  . OSA (obstructive sleep apnea) 11/07/2011  . Bradycardia 10/20/2011  . Insomnia 10/04/2011   . Obesity 01/12/2011  . ERECTILE DYSFUNCTION, ORGANIC 05/30/2010  . [place] DISEASE, LUMBAR 04/19/2010  . SCIATICA, LEFT 04/19/2010  . Chronic pain syndrome 10/27/2009  . Hyperlipidemia 07/15/2009  . Essential hypertension 06/24/2009  . Coronary artery disease involving native coronary artery of native heart without angina pectoris 06/24/2009  . Allergic rhinitis 06/24/2009  . URETHRAL STRICTURE 06/24/2009  . DEGENERATIVE JOINT DISEASE 06/24/2009  . SHOULDER PAIN, BILATERAL 06/24/2009  . FATIGUE 06/24/2009  . NEPHROLITHIASIS, HX OF 06/24/2009   Past Medical History:  Diagnosis Date  . ALLERGIC RHINITIS 06/24/2009  . Anxiety 11/12/2011   Adequate for discharge   . Arthritis    "all my joints" (09/30/2013)  . Arthritis of foot, right, degenerative 04/15/2014  . Balance disorder 03/12/2013  . Benign neoplasm of cecum   . Benign neoplasm of descending colon   . CAD (coronary artery disease) 06/24/2009   5 stents placed in 2007    . Chronic anticoagulation   . Chronic pain syndrome 10/27/2009   of ankle, shoulders, low back.  sciatica.   . Closed fracture of right foot 10/17/2014  . CORONARY ARTERY DISEASE 06/24/2009   a. s/p multiple PCIs - In 2008 he had a Taxus DES to the mild LAD, Endeavor DES to mid LCX and distal LCX. In January 2009 he had DES to distal LCX, mid LCX and proximal LCX. In November 2009 had BMS x 2 to the mid RCA. Cath 10/2011 with patent stents, noncardiac CP. LHC 01/2013: patent stents (noncardiac CP).  . DEGENERATIVE JOINT DISEASE 06/24/2009   Qualifier: Diagnosis of  By: [name], [name]   . Depression   . Depression with anxiety    Prior suicide attempt  . [place] DISEASE, LUMBAR 04/19/2010  . ERECTILE DYSFUNCTION, ORGANIC 05/30/2010  . Essential hypertension 06/24/2009   Qualifier: Diagnosis of  By: [name], [name]   . Fibromyalgia   . Fracture dislocation of ankle joint 09/02/2015  . Gait disorder 03/12/2013  . General weakness 07/14/2014  . GERD (gastroesophageal reflux  disease) 09/08/2015  . Hand joint pain 06/10/2013  . Heart murmur   . Hepatitis C   . History of kidney stones   . Hyperlipidemia 07/15/2009   Qualifier: Diagnosis of  By: Aundra Dubin, MD, [place]    . HYPERLIPIDEMIA-MIXED 07/15/2009  . HYPERTENSION 06/24/2009  . Insomnia 10/04/2011  . Irregular heart beat   . Left hip pain 03/12/2013   Injected under ultrasound guidance on June 24, 2013   . Major depression 09/13/2015  . Myocardial infarction ([place]) 2008  . Non-cardiac chest pain 10/2011, 01/2013  . Obesity   . Occult blood positive stool 10/17/2014  . Open ankle fracture 09/02/2015  . OSA (obstructive sleep apnea)    not using CPAP (09/30/2013)  . Pain of right thumb 04/03/2013  . Pneumonia   . PPD positive 04/08/2015  . Pre-ulcerative corn or callous 02/06/2013  . Rotator cuff tear arthropathy of both shoulders 06/10/2013  History of bilateral shoulder cuff surgery for rotator cuff tears. Reports increase in pain 09/11/2015 during physical therapy of the left shoulder.   [name] SCIATICA, LEFT 04/19/2010   Qualifier: Diagnosis of  By: [name], [name]   . Spinal stenosis in cervical region 09/26/2013  . Spinal stenosis, lumbar region, with neurogenic claudication 09/26/2013  . Type II diabetes mellitus ([place]) 08-Aug-2010   no meds in 09/2014.   [name] Uncontrolled type 2 DM with peripheral circulatory disorder ([place]) 10/04/2013  . URETHRAL STRICTURE 06/24/2009   self catheterizes.     Family History  Problem Relation Age of Onset  . Depression Mother   . Heart disease Mother   . Hypertension Mother   . Breast cancer Mother   . Diabetes Father   . Heart disease Father        CABG  . Hypertension Father   . Hyperlipidemia Father   . Prostate cancer Father   . Skin cancer Father   . Depression Brother        x 2  . Hypertension Brother        x2  . Healthy Son   . Heart disease Maternal Grandfather   . Early death Maternal Grandfather   . Heart attack Maternal Grandfather Aug 09, 2063  . Early death Paternal  Grandfather   . Coronary artery disease Other   . Hypertension Other   . Depression Other   . Healthy Son     Past Surgical History:  Procedure Laterality Date  . AMPUTATION Right 06/14/2016   Procedure: AMPUTATION BELOW KNEE;  Surgeon: Newt Minion, MD;  Location: [place];  Service: Orthopedics;  Laterality: Right;  . ANKLE FUSION Right 04/15/2014   Procedure: Right Subtalar, Talonavicular Fusion;  Surgeon: Newt Minion, MD;  Location: [place];  Service: Orthopedics;  Laterality: Right;  . ANKLE FUSION Right 04/18/2016   Procedure: Right Ankle Tibiocalcaneal Fusion;  Surgeon: Newt Minion, MD;  Location: [place];  Service: Orthopedics;  Laterality: Right;  . ANTERIOR CERVICAL DECOMP/DISCECTOMY FUSION N/A 09/26/2013   Procedure: ANTERIOR CERVICAL DISCECTOMY FUSION C3-4, plate and screw fixation, allograft bone graft;  Surgeon: Jessy Oto, MD;  Location: [place];  Service: Orthopedics;  Laterality: N/A;  . BACK SURGERY    . BELOW KNEE LEG AMPUTATION Right 06/14/2016  . CARDIAC CATHETERIZATION  X 1  . CARPAL TUNNEL RELEASE Bilateral   . COLONOSCOPY    . COLONOSCOPY N/A 10/22/2014   Procedure: COLONOSCOPY;  Surgeon: Lafayette Dragon, MD;  Location[facility] ENDOSCOPY;  Service: Endoscopy;  Laterality: N/A;  . CORONARY ANGIOPLASTY WITH STENT PLACEMENT     "I have 9 stents"  . ESOPHAGOGASTRODUODENOSCOPY N/A 10/19/2014   Procedure: ESOPHAGOGASTRODUODENOSCOPY (EGD);  Surgeon: Jerene Bears, MD;  Location: [facility] ENDOSCOPY;  Service: Endoscopy;  Laterality: N/A;  . FRACTURE SURGERY    . FUSION OF TALONAVICULAR JOINT Right 04/15/2014   dr duda  . HERNIA REPAIR     umbilical  . INGUINAL HERNIA REPAIR Right 05/11/2015   Procedure: LAPAROSCOPIC REPAIR RIGHT  INGUINAL HERNIA;  Surgeon: Greer Pickerel, MD;  Location: [place];  Service: General;  Laterality: Right;  . INSERTION OF MESH Right 05/11/2015   Procedure: INSERTION OF MESH;  Surgeon: Greer Pickerel, MD;  Location: [place];  Service: General;  Laterality: Right;  .  JOINT REPLACEMENT    . KNEE CARTILAGE SURGERY Right X 12   "~ 1/2 open; ~ 1/2 scopes"  . KNEE CARTILAGE SURGERY Left X 3   "  3 scopes"  . LEFT HEART CATHETERIZATION WITH CORONARY ANGIOGRAM N/A 02/10/2013   Procedure: LEFT HEART CATHETERIZATION WITH CORONARY ANGIOGRAM;  Surgeon: Burnell Blanks, MD;  Location: [facility] CATH LAB;  Service: Cardiovascular;  Laterality: N/A;  . LUMBAR LAMINECTOMY N/A 07/16/2018   Procedure: LEFT L4-5 REDO PARTIAL LUMBAR HEMILAMINECTOMY WITH FORAMINOTOMY LEFT L4;  Surgeon: Jessy Oto, MD;  Location: [place];  Service: Orthopedics;  Laterality: N/A;  . LUMBAR LAMINECTOMY/DECOMPRESSION MICRODISCECTOMY N/A 01/27/2014   Procedure: CENTRAL LUMBAR LAMINECTOMY L4-5 AND L3-4;  Surgeon: Jessy Oto, MD;  Location: [place];  Service: Orthopedics;  Laterality: N/A;  . ORIF ANKLE FRACTURE Right 09/02/2015   Procedure: OPEN REDUCTION INTERNAL FIXATION (ORIF) ANKLE FRACTURE;  Surgeon: Newt Minion, MD;  Location: [place];  Service: Orthopedics;  Laterality: Right;  . PERIPHERALLY INSERTED CENTRAL CATHETER INSERTION  09/02/2015  . SHOULDER ARTHROSCOPY W/ ROTATOR CUFF REPAIR Bilateral    "3 on the right; 1 on the left"  . SKIN SPLIT GRAFT Right 10/01/2015   Procedure: RIGHT ANKLE APPLY SKIN GRAFT SPLIT THICKNESS;  Surgeon: Newt Minion, MD;  Location: [place];  Service: Orthopedics;  Laterality: Right;  . TONSILLECTOMY    . TOOTH EXTRACTION    . TOTAL HIP ARTHROPLASTY Right 04/17/2018  . TOTAL HIP ARTHROPLASTY Right 04/17/2018   Procedure: RIGHT TOTAL HIP ARTHROPLASTY;  Surgeon: Newt Minion, MD;  Location: [place];  Service: Orthopedics;  Laterality: Right;  . TOTAL KNEE ARTHROPLASTY Bilateral 2008  . UMBILICAL HERNIA REPAIR     UHR  . URETHRAL DILATION  X 4  . VASECTOMY    . WISDOM TOOTH EXTRACTION     Social History   Occupational History  . Occupation: disabled since 2006 due to ortho. heart, psych    Employer: UNEMPLOYED  . Occupation: part time work as an Retail banker, wrestling, and Holiday representative  Tobacco Use  . Smoking status: Former Smoker    Types: Cigars    Last attempt to quit: 08/28/2010    Years since quitting: 7.9  . Smokeless tobacco: Never Used  . Tobacco comment: 04/18/2016 "smoked 1 cigar/wk when I did smoke"  Substance and Sexual Activity  . Alcohol use: Yes    Alcohol/week: 0.0 standard drinks    Comment: rarely  . Drug use: No  . Sexual activity: Not Currently   Exam Surgical incision looks good.  No drainage or signs of infection.

## 2018-08-02 DIAGNOSIS — I5023 Acute on chronic systolic (congestive) heart failure: Secondary | ICD-10-CM | POA: Diagnosis not present

## 2018-08-02 DIAGNOSIS — I110 Hypertensive heart disease with heart failure: Secondary | ICD-10-CM | POA: Diagnosis not present

## 2018-08-02 DIAGNOSIS — E1151 Type 2 diabetes mellitus with diabetic peripheral angiopathy without gangrene: Secondary | ICD-10-CM | POA: Diagnosis not present

## 2018-08-02 DIAGNOSIS — Z4789 Encounter for other orthopedic aftercare: Secondary | ICD-10-CM | POA: Diagnosis not present

## 2018-08-02 DIAGNOSIS — M4802 Spinal stenosis, cervical region: Secondary | ICD-10-CM | POA: Diagnosis not present

## 2018-08-02 DIAGNOSIS — I25118 Atherosclerotic heart disease of native coronary artery with other forms of angina pectoris: Secondary | ICD-10-CM | POA: Diagnosis not present

## 2018-08-02 DIAGNOSIS — M4726 Other spondylosis with radiculopathy, lumbar region: Secondary | ICD-10-CM | POA: Diagnosis not present

## 2018-08-02 DIAGNOSIS — M48062 Spinal stenosis, lumbar region with neurogenic claudication: Secondary | ICD-10-CM | POA: Diagnosis not present

## 2018-08-02 DIAGNOSIS — M5116 Intervertebral disc disorders with radiculopathy, lumbar region: Secondary | ICD-10-CM | POA: Diagnosis not present

## 2018-08-03 DIAGNOSIS — M48062 Spinal stenosis, lumbar region with neurogenic claudication: Secondary | ICD-10-CM | POA: Diagnosis not present

## 2018-08-03 DIAGNOSIS — I5023 Acute on chronic systolic (congestive) heart failure: Secondary | ICD-10-CM | POA: Diagnosis not present

## 2018-08-03 DIAGNOSIS — I110 Hypertensive heart disease with heart failure: Secondary | ICD-10-CM | POA: Diagnosis not present

## 2018-08-03 DIAGNOSIS — M5116 Intervertebral disc disorders with radiculopathy, lumbar region: Secondary | ICD-10-CM | POA: Diagnosis not present

## 2018-08-03 DIAGNOSIS — M4802 Spinal stenosis, cervical region: Secondary | ICD-10-CM | POA: Diagnosis not present

## 2018-08-03 DIAGNOSIS — Z4789 Encounter for other orthopedic aftercare: Secondary | ICD-10-CM | POA: Diagnosis not present

## 2018-08-03 DIAGNOSIS — M4726 Other spondylosis with radiculopathy, lumbar region: Secondary | ICD-10-CM | POA: Diagnosis not present

## 2018-08-03 DIAGNOSIS — I25118 Atherosclerotic heart disease of native coronary artery with other forms of angina pectoris: Secondary | ICD-10-CM | POA: Diagnosis not present

## 2018-08-03 DIAGNOSIS — E1151 Type 2 diabetes mellitus with diabetic peripheral angiopathy without gangrene: Secondary | ICD-10-CM | POA: Diagnosis not present

## 2018-08-05 ENCOUNTER — Telehealth (INDEPENDENT_AMBULATORY_CARE_PROVIDER_SITE_OTHER): Payer: Self-pay | Admitting: Specialist

## 2018-08-05 DIAGNOSIS — M1611 Unilateral primary osteoarthritis, right hip: Secondary | ICD-10-CM | POA: Diagnosis not present

## 2018-08-05 DIAGNOSIS — R6889 Other general symptoms and signs: Secondary | ICD-10-CM | POA: Diagnosis not present

## 2018-08-05 DIAGNOSIS — G894 Chronic pain syndrome: Secondary | ICD-10-CM | POA: Diagnosis not present

## 2018-08-05 DIAGNOSIS — M961 Postlaminectomy syndrome, not elsewhere classified: Secondary | ICD-10-CM | POA: Diagnosis not present

## 2018-08-05 NOTE — Telephone Encounter (Signed)
Travis [name] (PT) with [facility] called needing verbal orders for HHPT 1 Wk 1 and 2 Wk 4. Verdis Frederickson is also requesting  A Rx for a Hospital bed for the patient. The fax# is [phone].  The number to contact Verdis Frederickson is [phone]

## 2018-08-05 NOTE — Telephone Encounter (Signed)
Travis [name] with [facility] left a message this morning requesting VO for the following:  1x a week rof 1 week 2x a week for 4 weeks for [facility] PT.  Also she is needing an RX to be faxed over for a hospital bed.  Please fax to [phone].  CB#[phone].  Thank you.

## 2018-08-05 NOTE — Telephone Encounter (Signed)
Duplicate message. 

## 2018-08-05 NOTE — Telephone Encounter (Signed)
I called and gave verbal auth for HHPT and advised that I faxed the order for Hospital bed over to them.

## 2018-08-06 ENCOUNTER — Telehealth: Payer: Self-pay

## 2018-08-06 ENCOUNTER — Telehealth (INDEPENDENT_AMBULATORY_CARE_PROVIDER_SITE_OTHER): Payer: Self-pay | Admitting: Specialist

## 2018-08-06 ENCOUNTER — Telehealth: Payer: Self-pay | Admitting: Neurology

## 2018-08-06 DIAGNOSIS — N35011 Post-traumatic bulbous urethral stricture: Secondary | ICD-10-CM | POA: Diagnosis not present

## 2018-08-06 DIAGNOSIS — R6889 Other general symptoms and signs: Secondary | ICD-10-CM | POA: Diagnosis not present

## 2018-08-06 DIAGNOSIS — M5116 Intervertebral disc disorders with radiculopathy, lumbar region: Secondary | ICD-10-CM | POA: Diagnosis not present

## 2018-08-06 DIAGNOSIS — I5023 Acute on chronic systolic (congestive) heart failure: Secondary | ICD-10-CM | POA: Diagnosis not present

## 2018-08-06 DIAGNOSIS — M4802 Spinal stenosis, cervical region: Secondary | ICD-10-CM | POA: Diagnosis not present

## 2018-08-06 DIAGNOSIS — Z4789 Encounter for other orthopedic aftercare: Secondary | ICD-10-CM | POA: Diagnosis not present

## 2018-08-06 DIAGNOSIS — M48062 Spinal stenosis, lumbar region with neurogenic claudication: Secondary | ICD-10-CM | POA: Diagnosis not present

## 2018-08-06 DIAGNOSIS — I110 Hypertensive heart disease with heart failure: Secondary | ICD-10-CM | POA: Diagnosis not present

## 2018-08-06 DIAGNOSIS — I25118 Atherosclerotic heart disease of native coronary artery with other forms of angina pectoris: Secondary | ICD-10-CM | POA: Diagnosis not present

## 2018-08-06 DIAGNOSIS — E1151 Type 2 diabetes mellitus with diabetic peripheral angiopathy without gangrene: Secondary | ICD-10-CM | POA: Diagnosis not present

## 2018-08-06 DIAGNOSIS — N5201 Erectile dysfunction due to arterial insufficiency: Secondary | ICD-10-CM | POA: Diagnosis not present

## 2018-08-06 DIAGNOSIS — R3915 Urgency of urination: Secondary | ICD-10-CM | POA: Diagnosis not present

## 2018-08-06 DIAGNOSIS — M4726 Other spondylosis with radiculopathy, lumbar region: Secondary | ICD-10-CM | POA: Diagnosis not present

## 2018-08-06 NOTE — Telephone Encounter (Signed)
Pt is asking for a call re: the sleep study he had a few months ago.  Pt is asking if there is a 2nd part of it that he needs to be scheduled for, please call

## 2018-08-06 NOTE — Telephone Encounter (Signed)
Copied from [place] [id number]. Topic: Referral - Request for Referral >> Aug 06, 2018 10:55 AM Gustavus Messing wrote: Has patient seen PCP for this complaint? Yes.   *If NO, is insurance requiring patient see PCP for this issue before PCP can refer them? Referral for which specialty: Back  Preferred provider/office: Dr.John Reason for referral: [employer] only gave the patient one week in physical therapy and he needs more rehabilitation

## 2018-08-06 NOTE — Telephone Encounter (Signed)
April is requesting verbal orders for skilled nursing for 1 week 3

## 2018-08-06 NOTE — Telephone Encounter (Signed)
I called and gave verbal orders to Skilled Nursing

## 2018-08-06 NOTE — Telephone Encounter (Signed)
Please ask pt to forward (or have forwarded) the PT report which should detail how much and often he needs the PT

## 2018-08-07 ENCOUNTER — Telehealth (INDEPENDENT_AMBULATORY_CARE_PROVIDER_SITE_OTHER): Payer: Self-pay | Admitting: Specialist

## 2018-08-07 DIAGNOSIS — E1151 Type 2 diabetes mellitus with diabetic peripheral angiopathy without gangrene: Secondary | ICD-10-CM | POA: Diagnosis not present

## 2018-08-07 DIAGNOSIS — Z4789 Encounter for other orthopedic aftercare: Secondary | ICD-10-CM | POA: Diagnosis not present

## 2018-08-07 DIAGNOSIS — M48062 Spinal stenosis, lumbar region with neurogenic claudication: Secondary | ICD-10-CM | POA: Diagnosis not present

## 2018-08-07 DIAGNOSIS — I25118 Atherosclerotic heart disease of native coronary artery with other forms of angina pectoris: Secondary | ICD-10-CM | POA: Diagnosis not present

## 2018-08-07 DIAGNOSIS — I5023 Acute on chronic systolic (congestive) heart failure: Secondary | ICD-10-CM | POA: Diagnosis not present

## 2018-08-07 DIAGNOSIS — M4802 Spinal stenosis, cervical region: Secondary | ICD-10-CM | POA: Diagnosis not present

## 2018-08-07 DIAGNOSIS — I110 Hypertensive heart disease with heart failure: Secondary | ICD-10-CM | POA: Diagnosis not present

## 2018-08-07 DIAGNOSIS — M4726 Other spondylosis with radiculopathy, lumbar region: Secondary | ICD-10-CM | POA: Diagnosis not present

## 2018-08-07 DIAGNOSIS — M5116 Intervertebral disc disorders with radiculopathy, lumbar region: Secondary | ICD-10-CM | POA: Diagnosis not present

## 2018-08-07 NOTE — Telephone Encounter (Signed)
Pt stated that he will have his PT send over the information requested to proceed with the referral.

## 2018-08-07 NOTE — Telephone Encounter (Signed)
Costella Hatcher, OT, left a message requesting VO for OT for the following:  1x a week for 1 week 3x a week for 2 weeks 1x a week for 1 week  Also he requested an order for a social worker to go an evaluate the patient.  CB#[phone].  Thank you.

## 2018-08-08 NOTE — Telephone Encounter (Signed)
I called and gave verbal orders for OT

## 2018-08-09 DIAGNOSIS — E1151 Type 2 diabetes mellitus with diabetic peripheral angiopathy without gangrene: Secondary | ICD-10-CM | POA: Diagnosis not present

## 2018-08-09 DIAGNOSIS — M48062 Spinal stenosis, lumbar region with neurogenic claudication: Secondary | ICD-10-CM | POA: Diagnosis not present

## 2018-08-09 DIAGNOSIS — M4726 Other spondylosis with radiculopathy, lumbar region: Secondary | ICD-10-CM | POA: Diagnosis not present

## 2018-08-09 DIAGNOSIS — Z4789 Encounter for other orthopedic aftercare: Secondary | ICD-10-CM | POA: Diagnosis not present

## 2018-08-09 DIAGNOSIS — I25118 Atherosclerotic heart disease of native coronary artery with other forms of angina pectoris: Secondary | ICD-10-CM | POA: Diagnosis not present

## 2018-08-09 DIAGNOSIS — I110 Hypertensive heart disease with heart failure: Secondary | ICD-10-CM | POA: Diagnosis not present

## 2018-08-09 DIAGNOSIS — M5116 Intervertebral disc disorders with radiculopathy, lumbar region: Secondary | ICD-10-CM | POA: Diagnosis not present

## 2018-08-09 DIAGNOSIS — I5023 Acute on chronic systolic (congestive) heart failure: Secondary | ICD-10-CM | POA: Diagnosis not present

## 2018-08-09 DIAGNOSIS — M4802 Spinal stenosis, cervical region: Secondary | ICD-10-CM | POA: Diagnosis not present

## 2018-08-12 DIAGNOSIS — I110 Hypertensive heart disease with heart failure: Secondary | ICD-10-CM | POA: Diagnosis not present

## 2018-08-12 DIAGNOSIS — M4802 Spinal stenosis, cervical region: Secondary | ICD-10-CM | POA: Diagnosis not present

## 2018-08-12 DIAGNOSIS — I25118 Atherosclerotic heart disease of native coronary artery with other forms of angina pectoris: Secondary | ICD-10-CM | POA: Diagnosis not present

## 2018-08-12 DIAGNOSIS — I5023 Acute on chronic systolic (congestive) heart failure: Secondary | ICD-10-CM | POA: Diagnosis not present

## 2018-08-12 DIAGNOSIS — E1151 Type 2 diabetes mellitus with diabetic peripheral angiopathy without gangrene: Secondary | ICD-10-CM | POA: Diagnosis not present

## 2018-08-12 DIAGNOSIS — M48062 Spinal stenosis, lumbar region with neurogenic claudication: Secondary | ICD-10-CM | POA: Diagnosis not present

## 2018-08-12 DIAGNOSIS — Z4789 Encounter for other orthopedic aftercare: Secondary | ICD-10-CM | POA: Diagnosis not present

## 2018-08-12 DIAGNOSIS — M5116 Intervertebral disc disorders with radiculopathy, lumbar region: Secondary | ICD-10-CM | POA: Diagnosis not present

## 2018-08-12 DIAGNOSIS — M4726 Other spondylosis with radiculopathy, lumbar region: Secondary | ICD-10-CM | POA: Diagnosis not present

## 2018-08-13 ENCOUNTER — Telehealth: Payer: Self-pay

## 2018-08-13 DIAGNOSIS — Z4789 Encounter for other orthopedic aftercare: Secondary | ICD-10-CM | POA: Diagnosis not present

## 2018-08-13 DIAGNOSIS — G4733 Obstructive sleep apnea (adult) (pediatric): Secondary | ICD-10-CM

## 2018-08-13 DIAGNOSIS — E1151 Type 2 diabetes mellitus with diabetic peripheral angiopathy without gangrene: Secondary | ICD-10-CM | POA: Diagnosis not present

## 2018-08-13 DIAGNOSIS — I739 Peripheral vascular disease, unspecified: Secondary | ICD-10-CM

## 2018-08-13 DIAGNOSIS — M4726 Other spondylosis with radiculopathy, lumbar region: Secondary | ICD-10-CM | POA: Diagnosis not present

## 2018-08-13 DIAGNOSIS — I110 Hypertensive heart disease with heart failure: Secondary | ICD-10-CM | POA: Diagnosis not present

## 2018-08-13 DIAGNOSIS — I25118 Atherosclerotic heart disease of native coronary artery with other forms of angina pectoris: Secondary | ICD-10-CM | POA: Diagnosis not present

## 2018-08-13 DIAGNOSIS — F5104 Psychophysiologic insomnia: Secondary | ICD-10-CM

## 2018-08-13 DIAGNOSIS — M5116 Intervertebral disc disorders with radiculopathy, lumbar region: Secondary | ICD-10-CM | POA: Diagnosis not present

## 2018-08-13 DIAGNOSIS — I5023 Acute on chronic systolic (congestive) heart failure: Secondary | ICD-10-CM | POA: Diagnosis not present

## 2018-08-13 DIAGNOSIS — M48062 Spinal stenosis, lumbar region with neurogenic claudication: Secondary | ICD-10-CM | POA: Diagnosis not present

## 2018-08-13 DIAGNOSIS — M4802 Spinal stenosis, cervical region: Secondary | ICD-10-CM | POA: Diagnosis not present

## 2018-08-13 NOTE — Telephone Encounter (Signed)
You have ordered a CPAP study for patient. Due to sleep lab being closed due to Covid 19, would you like to order auto CPAP at home? If so, let sleep lab know to cancel in lab titration study and then you can order auto CPAP for patient. Thanks!

## 2018-08-13 NOTE — Addendum Note (Signed)
Addended by: Darleen Crocker on: 08/13/2018 05:30 PM   Modules accepted: Orders

## 2018-08-13 NOTE — Telephone Encounter (Signed)
Dr Brett Fairy has reviewed the [facility] and she suggest that the patient starts Auto CPAP 5-15 cm water pressure with 2 cm EPR. Order will be placed. Please inform the patient of the auto CPAP and advise the pt that he will have to have a follow up office visit within 31-90 days of starting the CPAP. He can call or our scheduler will call to schedule this apt with Dr Dohmeier, Ward Givens, Janett Billow or Amy NP.  Order will be sent to [facility] unless the patient prefers someone different. I will send a letter in the mail to patient as a reminder of all this once it has been discussed and sent back to me to send the order off.

## 2018-08-14 ENCOUNTER — Other Ambulatory Visit: Payer: Self-pay

## 2018-08-14 ENCOUNTER — Telehealth (INDEPENDENT_AMBULATORY_CARE_PROVIDER_SITE_OTHER): Payer: Medicare HMO | Admitting: Psychiatry

## 2018-08-14 DIAGNOSIS — F411 Generalized anxiety disorder: Secondary | ICD-10-CM | POA: Diagnosis not present

## 2018-08-14 DIAGNOSIS — F319 Bipolar disorder, unspecified: Secondary | ICD-10-CM

## 2018-08-14 DIAGNOSIS — Z79899 Other long term (current) drug therapy: Secondary | ICD-10-CM

## 2018-08-14 MED ORDER — ARIPIPRAZOLE 5 MG PO TABS: 5.0000 mg | ORAL_TABLET | Freq: Every day | ORAL | 0 refills | Status: DC

## 2018-08-14 MED ORDER — BENZTROPINE MESYLATE 0.5 MG PO TABS: 0.5000 mg | ORAL_TABLET | Freq: Every day | ORAL | 0 refills | Status: DC

## 2018-08-14 MED ORDER — DULOXETINE HCL 30 MG PO CPEP: 30.0000 mg | ORAL_CAPSULE | Freq: Two times a day (BID) | ORAL | 0 refills | Status: DC

## 2018-08-14 MED ORDER — TRAZODONE HCL 150 MG PO TABS: 150.0000 mg | ORAL_TABLET | Freq: Every day | ORAL | 0 refills | Status: DC

## 2018-08-14 NOTE — Progress Notes (Signed)
Virtual Visit via Telephone Note  I connected with Travis Novak Sr. on 08/14/18 at  4:00 PM EDT by telephone and verified that I am speaking with the correct person using two identifiers.   I discussed the limitations, risks, security and privacy concerns of performing an evaluation and management service by telephone and the availability of in person appointments. I also discussed with the patient that there may be a patient responsible charge related to this service. The patient expressed understanding and agreed to proceed.   History of Present Illness: Patient was evaluated through phone session.  He was last seen 6 months ago and he missed appointment because of his surgery.  He had back-to-back surgery in last 4 months.  He mentioned much improvement after his back and hip surgery.  He is able to walk better however due to pandemic coronavirus he is not going outside and doing social distancing.  He is sleeping very good with the trazodone.  He likes his current medication which is trazodone, Abilify, Cogentin and Cymbalta.  He denies any paranoia, hallucination or any severe mood swing or anger.  He lives with his niece and her family.  He was sad few months ago when he has to put his dog down who he had her since dog was 36 weeks old.  He is not looking for another dog.  Denied any side effects from the medication.  His energy level is improved after the surgery.  Denies any crying spells or any feeling of hopelessness or worthlessness.  He denied drinking alcohol or using any illegal substances.  He feel the current medicine helping his anxiety and he denies any recent panic attacks.  However he admitted situational anxiety due to pandemic coronavirus.    Past Psychiatric History: Reviewed Seen psychiatrist in [place] and [place]. H/O severe mood swings,suicidal gesture, anger, hallucination, irritability, poor impulse control and increase spending. Married 5 times. H/O inpatient in 2014 due  to overdose on Xanax and alcohol.  Did IOP. Took Effexor, Xanax, Klonopin, Ambien,Lunesta, Wellbutrin, Ambien, Remeron and Prozac. Abilify started on Abilify from this office.    Mental status examination: Limited mental stat examination done due to phone session.  Patient described his mood good and he denied any suicidal thoughts paranoia hallucination or any homicidal thought.  His thought process appears to be logical, normal, goal-directed and there were no flight of ideas or any loose association.  Denied reporting any side effects of the medication.  His fund of knowledge is adequate.  His speech is clear and coherent.  He is alert and oriented x3.  His insight judgment and impulse control is adequate.  Assessment and Plan: Bipolar disorder type I.  Generalized anxiety disorder.  Patient is a stable on his current medication.  Since taking the Cymbalta 30 mg twice a day on his last visit 6 months ago he noticed much improvement in his anxiety and also helping his chronic pain.  He is getting Klonopin from his primary care physician.  He does not want to change his medication.  I will continue Cymbalta 30 mg twice a day, Cogentin 0.5 mg bedtime, Abilify 5 mg daily and trazodone 150 mg daily.  I discussed medication side effects and benefits and recommended that he can call us if he has any question, concern or feeling worsening of the symptoms.  Discuss safety concerns at any time having active suicidal thoughts or homicidal thought then he need to call 911 or go to local emergency room.  I will see him again in 3 months.  Follow Up Instructions:    I discussed the assessment and treatment plan with the patient. The patient was provided an opportunity to ask questions and all were answered. The patient agreed with the plan and demonstrated an understanding of the instructions.   The patient was advised to call back or seek an in-person evaluation if the symptoms worsen or if the condition  fails to improve as anticipated.  I provided 15 minutes of non-face-to-face time during this encounter.   Kathlee Nations, MD

## 2018-08-15 ENCOUNTER — Emergency Department (HOSPITAL_COMMUNITY): Payer: Medicare HMO

## 2018-08-15 ENCOUNTER — Ambulatory Visit: Payer: Self-pay | Admitting: Internal Medicine

## 2018-08-15 ENCOUNTER — Encounter (HOSPITAL_COMMUNITY): Payer: Self-pay

## 2018-08-15 ENCOUNTER — Other Ambulatory Visit: Payer: Self-pay

## 2018-08-15 ENCOUNTER — Inpatient Hospital Stay (HOSPITAL_COMMUNITY)
Admission: EM | Admit: 2018-08-15 | Discharge: 2018-08-17 | DRG: 690 | Disposition: A | Discharge status: Home Health | Payer: Medicare HMO | Attending: Internal Medicine | Admitting: Internal Medicine

## 2018-08-15 DIAGNOSIS — R0789 Other chest pain: Secondary | ICD-10-CM

## 2018-08-15 DIAGNOSIS — N289 Disorder of kidney and ureter, unspecified: Secondary | ICD-10-CM | POA: Diagnosis not present

## 2018-08-15 DIAGNOSIS — W19XXXA Unspecified fall, initial encounter: Secondary | ICD-10-CM | POA: Diagnosis not present

## 2018-08-15 DIAGNOSIS — D638 Anemia in other chronic diseases classified elsewhere: Secondary | ICD-10-CM | POA: Diagnosis not present

## 2018-08-15 DIAGNOSIS — I252 Old myocardial infarction: Secondary | ICD-10-CM

## 2018-08-15 DIAGNOSIS — N3001 Acute cystitis with hematuria: Principal | ICD-10-CM | POA: Diagnosis present

## 2018-08-15 DIAGNOSIS — Y92009 Unspecified place in unspecified non-institutional (private) residence as the place of occurrence of the external cause: Secondary | ICD-10-CM

## 2018-08-15 DIAGNOSIS — E876 Hypokalemia: Secondary | ICD-10-CM | POA: Diagnosis not present

## 2018-08-15 DIAGNOSIS — D72810 Lymphocytopenia: Secondary | ICD-10-CM | POA: Diagnosis present

## 2018-08-15 DIAGNOSIS — E785 Hyperlipidemia, unspecified: Secondary | ICD-10-CM | POA: Diagnosis present

## 2018-08-15 DIAGNOSIS — Z915 Personal history of self-harm: Secondary | ICD-10-CM

## 2018-08-15 DIAGNOSIS — Z87442 Personal history of urinary calculi: Secondary | ICD-10-CM

## 2018-08-15 DIAGNOSIS — I5032 Chronic diastolic (congestive) heart failure: Secondary | ICD-10-CM | POA: Diagnosis present

## 2018-08-15 DIAGNOSIS — R509 Fever, unspecified: Secondary | ICD-10-CM | POA: Diagnosis present

## 2018-08-15 DIAGNOSIS — G4733 Obstructive sleep apnea (adult) (pediatric): Secondary | ICD-10-CM | POA: Diagnosis present

## 2018-08-15 DIAGNOSIS — E860 Dehydration: Secondary | ICD-10-CM | POA: Diagnosis present

## 2018-08-15 DIAGNOSIS — I110 Hypertensive heart disease with heart failure: Secondary | ICD-10-CM | POA: Diagnosis not present

## 2018-08-15 DIAGNOSIS — Z818 Family history of other mental and behavioral disorders: Secondary | ICD-10-CM

## 2018-08-15 DIAGNOSIS — M5489 Other dorsalgia: Secondary | ICD-10-CM | POA: Diagnosis not present

## 2018-08-15 DIAGNOSIS — Z89511 Acquired absence of right leg below knee: Secondary | ICD-10-CM | POA: Diagnosis not present

## 2018-08-15 DIAGNOSIS — M25519 Pain in unspecified shoulder: Secondary | ICD-10-CM | POA: Diagnosis not present

## 2018-08-15 DIAGNOSIS — R51 Headache: Secondary | ICD-10-CM | POA: Diagnosis not present

## 2018-08-15 DIAGNOSIS — M546 Pain in thoracic spine: Secondary | ICD-10-CM

## 2018-08-15 DIAGNOSIS — Z8349 Family history of other endocrine, nutritional and metabolic diseases: Secondary | ICD-10-CM

## 2018-08-15 DIAGNOSIS — R52 Pain, unspecified: Secondary | ICD-10-CM | POA: Diagnosis not present

## 2018-08-15 DIAGNOSIS — R7989 Other specified abnormal findings of blood chemistry: Secondary | ICD-10-CM | POA: Diagnosis not present

## 2018-08-15 DIAGNOSIS — R5081 Fever presenting with conditions classified elsewhere: Secondary | ICD-10-CM

## 2018-08-15 DIAGNOSIS — N529 Male erectile dysfunction, unspecified: Secondary | ICD-10-CM | POA: Diagnosis present

## 2018-08-15 DIAGNOSIS — Z7982 Long term (current) use of aspirin: Secondary | ICD-10-CM

## 2018-08-15 DIAGNOSIS — I2510 Atherosclerotic heart disease of native coronary artery without angina pectoris: Secondary | ICD-10-CM | POA: Diagnosis not present

## 2018-08-15 DIAGNOSIS — Z79899 Other long term (current) drug therapy: Secondary | ICD-10-CM

## 2018-08-15 DIAGNOSIS — F319 Bipolar disorder, unspecified: Secondary | ICD-10-CM | POA: Diagnosis present

## 2018-08-15 DIAGNOSIS — I248 Other forms of acute ischemic heart disease: Secondary | ICD-10-CM | POA: Diagnosis present

## 2018-08-15 DIAGNOSIS — Z86010 Personal history of colonic polyps: Secondary | ICD-10-CM

## 2018-08-15 DIAGNOSIS — M797 Fibromyalgia: Secondary | ICD-10-CM | POA: Diagnosis present

## 2018-08-15 DIAGNOSIS — E871 Hypo-osmolality and hyponatremia: Secondary | ICD-10-CM | POA: Diagnosis present

## 2018-08-15 DIAGNOSIS — D649 Anemia, unspecified: Secondary | ICD-10-CM | POA: Diagnosis not present

## 2018-08-15 DIAGNOSIS — F418 Other specified anxiety disorders: Secondary | ICD-10-CM | POA: Diagnosis present

## 2018-08-15 DIAGNOSIS — M19071 Primary osteoarthritis, right ankle and foot: Secondary | ICD-10-CM | POA: Diagnosis present

## 2018-08-15 DIAGNOSIS — Z9861 Coronary angioplasty status: Secondary | ICD-10-CM | POA: Diagnosis not present

## 2018-08-15 DIAGNOSIS — Z7902 Long term (current) use of antithrombotics/antiplatelets: Secondary | ICD-10-CM

## 2018-08-15 DIAGNOSIS — N390 Urinary tract infection, site not specified: Secondary | ICD-10-CM

## 2018-08-15 DIAGNOSIS — G894 Chronic pain syndrome: Secondary | ICD-10-CM | POA: Diagnosis present

## 2018-08-15 DIAGNOSIS — I959 Hypotension, unspecified: Secondary | ICD-10-CM | POA: Diagnosis present

## 2018-08-15 DIAGNOSIS — R0602 Shortness of breath: Secondary | ICD-10-CM | POA: Diagnosis not present

## 2018-08-15 DIAGNOSIS — E1151 Type 2 diabetes mellitus with diabetic peripheral angiopathy without gangrene: Secondary | ICD-10-CM | POA: Diagnosis present

## 2018-08-15 DIAGNOSIS — Z8249 Family history of ischemic heart disease and other diseases of the circulatory system: Secondary | ICD-10-CM

## 2018-08-15 DIAGNOSIS — M549 Dorsalgia, unspecified: Secondary | ICD-10-CM

## 2018-08-15 DIAGNOSIS — Z833 Family history of diabetes mellitus: Secondary | ICD-10-CM

## 2018-08-15 DIAGNOSIS — R079 Chest pain, unspecified: Secondary | ICD-10-CM | POA: Diagnosis not present

## 2018-08-15 DIAGNOSIS — M545 Low back pain: Secondary | ICD-10-CM | POA: Diagnosis not present

## 2018-08-15 LAB — CBC WITH DIFFERENTIAL/PLATELET
Abs Immature Granulocytes: 0.02 10*3/uL (ref 0.00–0.07)
Basophils Absolute: 0 10*3/uL (ref 0.0–0.1)
Basophils Relative: 0 %
Eosinophils Absolute: 0 10*3/uL (ref 0.0–0.5)
Eosinophils Relative: 0 %
HCT: 33.3 % — ABNORMAL LOW (ref 39.0–52.0)
Hemoglobin: 10.6 g/dL — ABNORMAL LOW (ref 13.0–17.0)
Immature Granulocytes: 0 %
Lymphocytes Relative: 5 %
Lymphs Abs: 0.4 10*3/uL — ABNORMAL LOW (ref 0.7–4.0)
MCH: 26.5 pg (ref 26.0–34.0)
MCHC: 31.8 g/dL (ref 30.0–36.0)
MCV: 83.3 fL (ref 80.0–100.0)
Monocytes Absolute: 0.4 10*3/uL (ref 0.1–1.0)
Monocytes Relative: 6 %
Neutro Abs: 5.7 10*3/uL (ref 1.7–7.7)
Neutrophils Relative %: 89 %
Platelets: 168 10*3/uL (ref 150–400)
RBC: 4 MIL/uL — ABNORMAL LOW (ref 4.22–5.81)
RDW: 15 % (ref 11.5–15.5)
WBC: 6.5 10*3/uL (ref 4.0–10.5)
nRBC: 0 % (ref 0.0–0.2)

## 2018-08-15 LAB — URINALYSIS, ROUTINE W REFLEX MICROSCOPIC
Bilirubin Urine: NEGATIVE
Glucose, UA: NEGATIVE mg/dL
Ketones, ur: NEGATIVE mg/dL
Nitrite: NEGATIVE
Protein, ur: NEGATIVE mg/dL
Specific Gravity, Urine: 1.024 (ref 1.005–1.030)
pH: 5 (ref 5.0–8.0)

## 2018-08-15 LAB — COMPREHENSIVE METABOLIC PANEL
ALT: 19 U/L (ref 0–44)
AST: 34 U/L (ref 15–41)
Albumin: 3.4 g/dL — ABNORMAL LOW (ref 3.5–5.0)
Alkaline Phosphatase: 60 U/L (ref 38–126)
Anion gap: 10 (ref 5–15)
BUN: 21 mg/dL (ref 8–23)
CO2: 26 mmol/L (ref 22–32)
Calcium: 8.6 mg/dL — ABNORMAL LOW (ref 8.9–10.3)
Chloride: 97 mmol/L — ABNORMAL LOW (ref 98–111)
Creatinine, Ser: 1.28 mg/dL — ABNORMAL HIGH (ref 0.61–1.24)
GFR calc Af Amer: >60 mL/min (ref >60)
GFR calc non Af Amer: 58 mL/min — ABNORMAL LOW (ref >60)
Glucose, Bld: 156 mg/dL — ABNORMAL HIGH (ref 70–99)
Potassium: 3.1 mmol/L — ABNORMAL LOW (ref 3.5–5.1)
Sodium: 133 mmol/L — ABNORMAL LOW (ref 135–145)
Total Bilirubin: 1 mg/dL (ref 0.3–1.2)
Total Protein: 6.3 g/dL — ABNORMAL LOW (ref 6.5–8.1)

## 2018-08-15 LAB — INFLUENZA PANEL BY PCR (TYPE A & B)
Influenza A By PCR: NEGATIVE
Influenza B By PCR: NEGATIVE

## 2018-08-15 LAB — I-STAT TROPONIN, ED
Troponin i, poc: 0.1 ng/mL (ref 0.00–0.08)
Troponin i, poc: 0.08 ng/mL (ref 0.00–0.08)

## 2018-08-15 LAB — LACTIC ACID, PLASMA
Lactic Acid, Venous: 1.1 mmol/L (ref 0.5–1.9)
Lactic Acid, Venous: 0.7 mmol/L (ref 0.5–1.9)

## 2018-08-15 MED ORDER — POTASSIUM CHLORIDE CRYS ER 20 MEQ PO TBCR
60.0000 meq | EXTENDED_RELEASE_TABLET | Freq: Once | ORAL | Status: AC
  Administered 2018-08-15: 60 meq via ORAL
  Filled 2018-08-15: qty 3

## 2018-08-15 MED ORDER — VANCOMYCIN HCL 10 G IV SOLR
1750.0000 mg | INTRAVENOUS | Status: DC
  Filled 2018-08-15: qty 1750

## 2018-08-15 MED ORDER — ACETAMINOPHEN 500 MG PO TABS
1000.0000 mg | ORAL_TABLET | Freq: Once | ORAL | Status: AC
  Administered 2018-08-15: 1000 mg via ORAL
  Filled 2018-08-15: qty 2

## 2018-08-15 MED ORDER — HYDROCODONE-ACETAMINOPHEN 5-325 MG PO TABS
1.0000 | ORAL_TABLET | Freq: Four times a day (QID) | ORAL | Status: DC | PRN
  Administered 2018-08-15 – 2018-08-17 (×6): 2 via ORAL
  Filled 2018-08-15 (×6): qty 2

## 2018-08-15 MED ORDER — SODIUM CHLORIDE 0.9 % IV BOLUS
500.0000 mL | Freq: Once | INTRAVENOUS | Status: AC
  Administered 2018-08-15: 500 mL via INTRAVENOUS

## 2018-08-15 MED ORDER — ENOXAPARIN SODIUM 40 MG/0.4ML ~~LOC~~ SOLN
40.0000 mg | SUBCUTANEOUS | Status: DC
  Administered 2018-08-15 – 2018-08-16 (×2): 40 mg via SUBCUTANEOUS
  Filled 2018-08-15 (×2): qty 0.4

## 2018-08-15 MED ORDER — INSULIN ASPART 100 UNIT/ML ~~LOC~~ SOLN: 0.0000 [IU] | Freq: Three times a day (TID) | SUBCUTANEOUS | Status: DC

## 2018-08-15 MED ORDER — FENTANYL CITRATE (PF) 100 MCG/2ML IJ SOLN
50.0000 ug | Freq: Once | INTRAMUSCULAR | Status: AC
  Administered 2018-08-15: 50 ug via INTRAVENOUS
  Filled 2018-08-15: qty 2

## 2018-08-15 MED ORDER — HYDROCODONE-ACETAMINOPHEN 5-325 MG PO TABS: 1.0000 | ORAL_TABLET | ORAL | Status: DC | PRN

## 2018-08-15 MED ORDER — SODIUM CHLORIDE 0.9 % IV SOLN
1.0000 g | Freq: Once | INTRAVENOUS | Status: AC
  Administered 2018-08-15: 1 g via INTRAVENOUS
  Filled 2018-08-15: qty 10

## 2018-08-15 MED ORDER — SODIUM CHLORIDE 0.9 % IV SOLN
INTRAVENOUS | Status: DC
  Administered 2018-08-15: via INTRAVENOUS

## 2018-08-15 MED ORDER — VANCOMYCIN HCL 10 G IV SOLR
2500.0000 mg | Freq: Once | INTRAVENOUS | Status: AC
  Administered 2018-08-15: 2500 mg via INTRAVENOUS
  Filled 2018-08-15: qty 2500

## 2018-08-15 MED ORDER — SODIUM CHLORIDE 0.9 % IV SOLN
2.0000 g | Freq: Three times a day (TID) | INTRAVENOUS | Status: DC
  Administered 2018-08-16: 2 g via INTRAVENOUS
  Filled 2018-08-15 (×2): qty 2

## 2018-08-15 MED ORDER — ACETAMINOPHEN 650 MG RE SUPP: 650.0000 mg | Freq: Four times a day (QID) | RECTAL | Status: DC | PRN

## 2018-08-15 MED ORDER — ACETAMINOPHEN 325 MG PO TABS
650.0000 mg | ORAL_TABLET | Freq: Four times a day (QID) | ORAL | Status: DC | PRN
  Administered 2018-08-16: 650 mg via ORAL
  Filled 2018-08-15 (×2): qty 2

## 2018-08-15 MED ORDER — SODIUM CHLORIDE 0.9 % IV SOLN
2.0000 g | Freq: Once | INTRAVENOUS | Status: AC
  Administered 2018-08-16: 2 g via INTRAVENOUS
  Filled 2018-08-15: qty 2

## 2018-08-15 MED ORDER — VANCOMYCIN HCL IN DEXTROSE 1-5 GM/200ML-% IV SOLN: 1000.0000 mg | Freq: Two times a day (BID) | INTRAVENOUS | Status: DC

## 2018-08-15 NOTE — H&P (Signed)
History and Physical    Travis [name] ZYS:[id number] DOB: [birth year] DOA: 08/15/2018  PCP: [name], MD Patient coming from: Home  Chief Complaint: Fever, fall, chest pain  HPI: Travis [name]. is a 68 y.o. male with medical history significant of CAD status post PCI, history of prior MI, chronic pain, depression, anxiety, hypertension, hyperlipidemia, GERD, OSA, type 2 diabetes, PAD status post right BKA, CHF and conditions listed below presenting to the hospital for evaluation of fever, fall, and chest pain.  Patient received aspirin and 1 sublingual nitroglycerin by EMS and subsequently became hypotensive.  Patient states he had a fever of 104 F yesterday and has a continued to have fever since then.  He has been coughing a little for the past few weeks.  Denies having any shortness of breath.  Denies any abdominal pain, nausea, vomiting, or diarrhea.  Reports having dysuria, urinary frequency, and urgency.  Reports 4-day history of constant right-sided chest pressure with started while he was watching television.  Denies having any chest pain/pressure at present.  States this afternoon he was standing up to feel pills and medication bottles but fell because his leg gave out.  Denies any dizziness, chest pain, or shortness of breath prior to the fall.  States he was on the ground for several hours and could not get up.  He thinks he may have hit his head a little.  Reports having severe pain in his upper back and right scapular region.  Patient initially stated that his upper back pain started after the fall but then later stated he has had this pain for several weeks.  Review of Systems: As per HPI otherwise 10 point review of systems negative.  Past Medical History:  Diagnosis Date  . ALLERGIC RHINITIS 06/24/2009  . Anxiety 11/12/2011   Adequate for discharge   . Arthritis    "all my joints" (09/30/2013)  . Arthritis of foot, right, degenerative 04/15/2014  . Balance  disorder 03/12/2013  . Benign neoplasm of cecum   . Benign neoplasm of descending colon   . CAD (coronary artery disease) 06/24/2009   5 stents placed in 2007    . Chronic anticoagulation   . Chronic pain syndrome 10/27/2009   of ankle, shoulders, low back.  sciatica.   . Closed fracture of right foot 10/17/2014  . CORONARY ARTERY DISEASE 06/24/2009   a. s/p multiple PCIs - In 2008 he had a Taxus DES to the mild LAD, Endeavor DES to mid LCX and distal LCX. In January 2009 he had DES to distal LCX, mid LCX and proximal LCX. In November 2009 had BMS x 2 to the mid RCA. Cath 10/2011 with patent stents, noncardiac CP. LHC 01/2013: patent stents (noncardiac CP).  . DEGENERATIVE JOINT DISEASE 06/24/2009   Qualifier: Diagnosis of  By: [name], [name]   . Depression   . Depression with anxiety    Prior suicide attempt  . [place] DISEASE, LUMBAR 04/19/2010  . ERECTILE DYSFUNCTION, ORGANIC 05/30/2010  . Essential hypertension 06/24/2009   Qualifier: Diagnosis of  By: [name], [name]   . Fibromyalgia   . Fracture dislocation of ankle joint 09/02/2015  . Gait disorder 03/12/2013  . General weakness 07/14/2014  . GERD (gastroesophageal reflux disease) 09/08/2015  . Hand joint pain 06/10/2013  . Heart murmur   . Hepatitis C   . History of kidney stones   . Hyperlipidemia 07/15/2009   Qualifier: Diagnosis of  By: Aundra Dubin, MD, [place]    .  HYPERLIPIDEMIA-MIXED 07/15/2009  . HYPERTENSION 06/24/2009  . Insomnia 10/04/2011  . Irregular heart beat   . Left hip pain 03/12/2013   Injected under ultrasound guidance on June 24, 2013   . Major depression 09/13/2015  . Myocardial infarction ([place]) 2008  . Non-cardiac chest pain 10/2011, 01/2013  . Obesity   . Occult blood positive stool 10/17/2014  . Open ankle fracture 09/02/2015  . OSA (obstructive sleep apnea)    not using CPAP (09/30/2013)  . Pain of right thumb 04/03/2013  . Pneumonia   . PPD positive 04/08/2015  . Pre-ulcerative corn or callous 02/06/2013  . Rotator  cuff tear arthropathy of both shoulders 06/10/2013   History of bilateral shoulder cuff surgery for rotator cuff tears. Reports increase in pain 09/11/2015 during physical therapy of the left shoulder.   [name] SCIATICA, LEFT 04/19/2010   Qualifier: Diagnosis of  By: [name], [name]   . Spinal stenosis in cervical region 09/26/2013  . Spinal stenosis, lumbar region, with neurogenic claudication 09/26/2013  . Type II diabetes mellitus ([place]) 2012   no meds in 09/2014.   [name] Uncontrolled type 2 DM with peripheral circulatory disorder ([place]) 10/04/2013  . URETHRAL STRICTURE 06/24/2009   self catheterizes.     Past Surgical History:  Procedure Laterality Date  . AMPUTATION Right 06/14/2016   Procedure: AMPUTATION BELOW KNEE;  Surgeon: Newt Minion, MD;  Location: [place];  Service: Orthopedics;  Laterality: Right;  . ANKLE FUSION Right 04/15/2014   Procedure: Right Subtalar, Talonavicular Fusion;  Surgeon: Newt Minion, MD;  Location: [place];  Service: Orthopedics;  Laterality: Right;  . ANKLE FUSION Right 04/18/2016   Procedure: Right Ankle Tibiocalcaneal Fusion;  Surgeon: Newt Minion, MD;  Location: [place];  Service: Orthopedics;  Laterality: Right;  . ANTERIOR CERVICAL DECOMP/DISCECTOMY FUSION N/A 09/26/2013   Procedure: ANTERIOR CERVICAL DISCECTOMY FUSION C3-4, plate and screw fixation, allograft bone graft;  Surgeon: Jessy Oto, MD;  Location: [place];  Service: Orthopedics;  Laterality: N/A;  . BACK SURGERY    . BELOW KNEE LEG AMPUTATION Right 06/14/2016  . CARDIAC CATHETERIZATION  X 1  . CARPAL TUNNEL RELEASE Bilateral   . COLONOSCOPY    . COLONOSCOPY N/A 10/22/2014   Procedure: COLONOSCOPY;  Surgeon: Lafayette Dragon, MD;  Location: [facility] ENDOSCOPY;  Service: Endoscopy;  Laterality: N/A;  . CORONARY ANGIOPLASTY WITH STENT PLACEMENT     "I have 9 stents"  . ESOPHAGOGASTRODUODENOSCOPY N/A 10/19/2014   Procedure: ESOPHAGOGASTRODUODENOSCOPY (EGD);  Surgeon: Jerene Bears, MD;  Location: [facility] ENDOSCOPY;  Service:  Endoscopy;  Laterality: N/A;  . FRACTURE SURGERY    . FUSION OF TALONAVICULAR JOINT Right 04/15/2014   dr duda  . HERNIA REPAIR     umbilical  . INGUINAL HERNIA REPAIR Right 05/11/2015   Procedure: LAPAROSCOPIC REPAIR RIGHT  INGUINAL HERNIA;  Surgeon: Greer Pickerel, MD;  Location: [place];  Service: General;  Laterality: Right;  . INSERTION OF MESH Right 05/11/2015   Procedure: INSERTION OF MESH;  Surgeon: Greer Pickerel, MD;  Location: [employer];  Service: General;  Laterality: Right;  . JOINT REPLACEMENT    . KNEE CARTILAGE SURGERY Right X 12   "~ 1/2 open; ~ 1/2 scopes"  . KNEE CARTILAGE SURGERY Left X 3   "3 scopes"  . LEFT HEART CATHETERIZATION WITH CORONARY ANGIOGRAM N/A 02/10/2013   Procedure: LEFT HEART CATHETERIZATION WITH CORONARY ANGIOGRAM;  Surgeon: Burnell Blanks, MD;  Location[facility] C[facility] LAB;  Service: Cardiovascular;  Laterality: N/A;  . LUMBAR LAMINECTOMY N/A 07/24/2018   Procedure: LEFT L4-5 REDO PARTIAL LUMBAR HEMILAMINECTOMY WITH FORAMINOTOMY LEFT L4;  Surgeon: Jessy Oto, MD;  Location: [place];  Service: Orthopedics;  Laterality: N/A;  . LUMBAR LAMINECTOMY/DECOMPRESSION MICRODISCECTOMY N/A 01/27/2014   Procedure: CENTRAL LUMBAR LAMINECTOMY L4-5 AND L3-4;  Surgeon: Jessy Oto, MD;  Location: [place];  Service: Orthopedics;  Laterality: N/A;  . ORIF ANKLE FRACTURE Right 09/02/2015   Procedure: OPEN REDUCTION INTERNAL FIXATION (ORIF) ANKLE FRACTURE;  Surgeon: Newt Minion, MD;  Location: [place];  Service: Orthopedics;  Laterality: Right;  . PERIPHERALLY INSERTED CENTRAL CATHETER INSERTION  09/02/2015  . SHOULDER ARTHROSCOPY W/ ROTATOR CUFF REPAIR Bilateral    "3 on the right; 1 on the left"  . SKIN SPLIT GRAFT Right 10/01/2015   Procedure: RIGHT ANKLE APPLY SKIN GRAFT SPLIT THICKNESS;  Surgeon: Newt Minion, MD;  Location: [place];  Service: Orthopedics;  Laterality: Right;  . TONSILLECTOMY    . TOOTH EXTRACTION    . TOTAL HIP ARTHROPLASTY Right 04/17/2018  . TOTAL HIP  ARTHROPLASTY Right 04/17/2018   Procedure: RIGHT TOTAL HIP ARTHROPLASTY;  Surgeon: Newt Minion, MD;  Location: [place];  Service: Orthopedics;  Laterality: Right;  . TOTAL KNEE ARTHROPLASTY Bilateral 07/24/06  . UMBILICAL HERNIA REPAIR     UHR  . URETHRAL DILATION  X 4  . VASECTOMY    . WISDOM TOOTH EXTRACTION       reports that he quit smoking about 7 years ago. His smoking use included cigars. He has never used smokeless tobacco. He reports current alcohol use. He reports that he does not use drugs.  No Known Allergies  Family History  Problem Relation Age of Onset  . Depression Mother   . Heart disease Mother   . Hypertension Mother   . Breast cancer Mother   . Diabetes Father   . Heart disease Father        CABG  . Hypertension Father   . Hyperlipidemia Father   . Prostate cancer Father   . Skin cancer Father   . Depression Brother        x 2  . Hypertension Brother        x2  . Healthy Son   . Heart disease Maternal Grandfather   . Early death Maternal Grandfather   . Heart attack Maternal Grandfather 25-Jul-2063  . Early death Paternal Grandfather   . Coronary artery disease Other   . Hypertension Other   . Depression Other   . Healthy Son     Prior to Admission medications   Medication Sig Start Date End Date Taking? Authorizing Provider  clonazePAM (KLONOPIN) 0.5 MG tablet Take 1 tablet (0.5 mg total) by mouth at bedtime. 07/17/18  Yes Jessy Oto, MD  HYDROcodone-acetaminophen (NORCO/VICODIN) 5-325 MG tablet Take 1 tablet by mouth every 6 (six) hours as needed (for pain).   Yes [provider]  pantoprazole (PROTONIX) 40 MG tablet TAKE ONE TABLET BY MOUTH EVERY DAY Patient taking differently: Take 40 mg by mouth daily.  07/05/18  Yes [name], MD  traZODone (DESYREL) 150 MG tablet Take 1 tablet (150 mg total) by mouth at bedtime. 08/14/18  Yes Arfeen, Arlyce Harman, MD  amoxicillin (AMOXIL) 500 MG capsule Take 2,000 mg by mouth See admin instructions. Take 2000 mg  by mouth 1 hour prior to dental appointment 06/26/18   [provider]  ARIPiprazole (ABILIFY) 5 MG tablet Take 1  tablet (5 mg total) by mouth at bedtime. 08/14/18   Arfeen, Arlyce Harman, MD  aspirin 81 MG tablet Take 81 mg by mouth at bedtime.     [provider]  atorvastatin (LIPITOR) 20 MG tablet Take 1 tablet (20 mg total) by mouth daily. Patient taking differently: Take 20 mg by mouth at bedtime.  01/03/18   End, Harrell Gave, MD  benztropine (COGENTIN) 0.5 MG tablet Take 1 tablet (0.5 mg total) by mouth at bedtime. 08/14/18 08/14/19  Arfeen, Arlyce Harman, MD  cyclobenzaprine (FLEXERIL) 5 MG tablet Take 1 tablet (5 mg total) by mouth 3 (three) times daily for 30 days. TAKE ONE TABLET BY MOUTH THREE TIMES DAILY AS NEEDED muscle spasm Patient taking differently: Take 5 mg by mouth 3 (three) times daily as needed for muscle spasms.  07/17/18 08/16/18  Jessy Oto, MD  DULoxetine (CYMBALTA) 30 MG capsule Take 1 capsule (30 mg total) by mouth 2 (two) times daily. 08/14/18   Arfeen, Arlyce Harman, MD  gabapentin (NEURONTIN) 600 MG tablet Take 1 tablet (600 mg total) by mouth 3 (three) times daily. 07/19/18   Jessy Oto, MD  hydrochlorothiazide (HYDRODIURIL) 25 MG tablet Take 25 mg by mouth at bedtime.     [provider]  Melatonin 5 MG TABS Take 20 mg by mouth at bedtime.  12/31/17   [provider]  mirabegron ER (MYRBETRIQ) 50 MG TB24 tablet Take 50 mg by mouth at bedtime.     [provider]  Omega-3 Fatty Acids (FISH OIL) 1000 MG CAPS Take 1,000 mg by mouth at bedtime.     [provider]  oxyCODONE-acetaminophen (PERCOCET/ROXICET) 5-325 MG tablet Take 1-2 tablets by mouth every 4 (four) hours as needed. 07/17/18   Jessy Oto, MD  prasugrel (EFFIENT) 5 MG TABS tablet TAKE ONE TABLET BY MOUTH EVERY DAY Patient taking differently: Take 5 mg by mouth at bedtime.  10/25/17   End, Harrell Gave, MD  pregabalin (LYRICA) 150 MG capsule Take 150 mg by mouth 2 (two) times  daily. 08/12/18   [provider]    Physical Exam: Vitals:   08/15/18 2011 08/15/18 2030 08/15/18 2100 08/15/18 2200  BP: (!) 103/41 92/77 (!) 105/55 (!) 103/50  Pulse: 71 72 74   Resp: 14 14 12 14   Temp:      TempSrc:      SpO2: 96% 98% 98%   Weight:      Height:        Physical Exam  Constitutional: He is oriented to person, place, and time. He appears distressed.  Morbidly obese Distress secondary to back pain  HENT:  Head: Normocephalic.  Mouth/Throat: Oropharynx is clear and moist.  Eyes: Right eye exhibits no discharge. Left eye exhibits no discharge.  Neck: Neck supple.  Cardiovascular: Normal rate, regular rhythm and intact distal pulses.  Pulmonary/Chest: Effort normal. No respiratory distress.  Difficult to clearly appreciate the quality of breath sounds due to large body habitus.  Equal air entry bilaterally.  Abdominal: Soft. Bowel sounds are normal. He exhibits no distension. There is no abdominal tenderness. There is no guarding.  Musculoskeletal:        General: No edema.     Comments: Thoracic spine and right scapula very tender to palpation.  No tenderness on palpation of cervical and lumbar spine.  Neurological: He is alert and oriented to person, place, and time.  Moving bilateral upper extremities and left lower extremity spontaneously.  Right lower extremity BKA.  Skin: Skin is warm and dry.     Labs on Admission: I have personally reviewed following labs and imaging studies  CBC: Recent Labs  Lab 08/15/18 1653  WBC 6.5  NEUTROABS 5.7  HGB 10.6*  HCT 33.3*  MCV 83.3  PLT 606   Basic Metabolic Panel: Recent Labs  Lab 08/15/18 1653  NA 133*  K 3.1*  CL 97*  CO2 26  GLUCOSE 156*  BUN 21  CREATININE 1.28*  CALCIUM 8.6*   GFR: Estimated Creatinine Clearance: 85 mL/min (A) (by C-G formula based on SCr of 1.28 mg/dL (H)). Liver Function Tests: Recent Labs  Lab 08/15/18 1653  AST 34  ALT 19  ALKPHOS 60  BILITOT 1.0  PROT  6.3*  ALBUMIN 3.4*   No results for input(s): LIPASE, AMYLASE in the last 168 hours. No results for input(s): AMMONIA in the last 168 hours. Coagulation Profile: No results for input(s): INR, PROTIME in the last 168 hours. Cardiac Enzymes: No results for input(s): CKTOTAL, CKMB, CKMBINDEX, TROPONINI in the last 168 hours. BNP (last 3 results) No results for input(s): PROBNP in the last 8760 hours. HbA1C: No results for input(s): HGBA1C in the last 72 hours. CBG: No results for input(s): GLUCAP in the last 168 hours. Lipid Profile: No results for input(s): CHOL, HDL, LDLCALC, TRIG, CHOLHDL, LDLDIRECT in the last 72 hours. Thyroid Function Tests: No results for input(s): TSH, T4TOTAL, FREET4, T3FREE, THYROIDAB in the last 72 hours. Anemia Panel: No results for input(s): VITAMINB12, FOLATE, FERRITIN, TIBC, IRON, RETICCTPCT in the last 72 hours. Urine analysis:    Component Value Date/Time   COLORURINE AMBER (A) 08/15/2018 1800   APPEARANCEUR HAZY (A) 08/15/2018 1800   LABSPEC 1.024 08/15/2018 1800   PHURINE 5.0 08/15/2018 1800   GLUCOSEU NEGATIVE 08/15/2018 1800   GLUCOSEU NEGATIVE 06/03/2018 1333   HGBUR SMALL (A) 08/15/2018 1800   HGBUR negative 04/19/2010 1014   BILIRUBINUR NEGATIVE 08/15/2018 1800   KETONESUR NEGATIVE 08/15/2018 1800   PROTEINUR NEGATIVE 08/15/2018 1800   UROBILINOGEN 0.2 06/03/2018 1333   NITRITE NEGATIVE 08/15/2018 1800   LEUKOCYTESUR SMALL (A) 08/15/2018 1800    Radiological Exams on Admission: Ct Head Wo Contrast  Result Date: 08/15/2018 CLINICAL DATA:  Recent fall with headaches, initial encounter EXAM: CT HEAD WITHOUT CONTRAST TECHNIQUE: Contiguous axial images were obtained from the base of the skull through the vertex without intravenous contrast. COMPARISON:  04/23/2014 FINDINGS: Brain: No evidence of acute infarction, hemorrhage, hydrocephalus, extra-axial collection or mass lesion/mass effect. Vascular: No hyperdense vessel or unexpected  calcification. Skull: Normal. Negative for fracture or focal lesion. Sinuses/Orbits: Paranasal sinuses demonstrates mucosal thickening as well as prior surgical changes in the maxillary antra bilaterally. Other: None. IMPRESSION: No acute intracranial abnormality noted. Mild sinus disease. Electronically Signed   By: Inez Catalina M.D.   On: 08/15/2018 18:15   Dg Chest Port 1 View  Result Date: 08/15/2018 CLINICAL DATA:  Chest pain and fever for 2 days EXAM: PORTABLE CHEST 1 VIEW COMPARISON:  01/14/2018 FINDINGS: Cardiac shadow is stable. The lungs are well aerated bilaterally. No focal infiltrate or sizable effusion is seen. No acute bony abnormality is noted. IMPRESSION: No active disease. Electronically Signed   By: Inez Catalina M.D.   On: 08/15/2018 17:13    EKG: Independently reviewed.  Sinus rhythm, not suggestive of ACS.  Assessment/Plan Principal Problem:   Fever Active Problems:   UTI (urinary tract infection)   Atypical chest pain   Fall at home, initial encounter  Hypokalemia   Fever 2/2 suspected UTI: T-max 101.4 F.  Not tachycardic or tachypneic.  Blood pressure 86/49 on arrival, improved with IV fluid.  Labs with evidence of lymphopenia. Lactic acid x2 normal.  UA (not clean catch) with small amount of leukocytes, 6-10 WBCs, 6-10 RBCs, few bacteria, and negative nitrite.  Patient does endorse UTI symptoms.  Influenza panel negative.  Chest x-ray not suggestive of pneumonia. Although his fever could be explained by a UTI, there is concern for possible  COVID-19 infection given complains of of a cough and lymphopenia on labs in the setting of current community spread of this viral infection. However, likely low risk in the setting of no infiltrates on chest x-ray and no hypoxia.  There is also concern for possible spinal epidural abscess given severe tenderness on palpation of the thoracic spine and fever.  Although type 2 diabetes is well controlled with last A1c 5.2.  It is not clear  from the history whether patient's upper back pain is acute from his fall today or chronic.  He initially stated that the back pain started after his fall today but then later stated he has had it for several weeks.   Plan: Broad-spectrum antibiotics including vancomycin and cefepime.  Norco PRN and Tylenol PRN.  Continue IV fluid resuscitation.  Check procalcitonin level.  Check respiratory viral panel.  X-ray of thoracic spine to rule out fracture.  Check ESR and CRP levels.  If x-ray without evidence of fracture and ESR/CRP elevated, he will need additional imaging in the form of MRI with contrast to rule out spinal epidural abscess. Blood culture x2 pending.  Urine culture pending.  Atypical chest pain; history of CAD: Reports 4-day history of constant right-sided chest pressure.  Denies having any chest pain/pressure at present.  Troponin 0.10 > 0.08.  EKG not suggestive of ACS. Plan: Received aspirin via EMS.  Became hypotensive after receiving nitroglycerin, avoid at this time.  Currently chest pain-free.  Continue to trend troponin.  Cardiac monitoring.  Continue to monitor.  Fall: Patient states his legs gave out.  Denies any lightheadedness, chest pain, or shortness of breath prior to the fall.  No focal neuro deficit.  CT head negative for acute intracranial abnormality.  Complaining of severe pain in his thoracic spine and right scapula. Plan: X-rays of thoracic spine and right scapula to rule out fracture.  Check CK as patient states he was on the floor for several hours.  PT/OT evaluation.  Chronic anemia: Hemoglobin 10.6, was 11.3 four weeks ago.  No signs of active bleeding. Plan: Continue to monitor CBC  Hypokalemia: Potassium 3.1. Plan: Replete potassium.  Check magnesium level.  Continue to monitor BMP.  Well-controlled type 2 diabetes: A1c 5.2 in January 2020.  Random blood glucose 156.   Plan: Sliding scale insulin and CBG checks.  DVT prophylaxis: Lovenox Code Status: Patient  wishes to be full code. Family Communication: No family available. Disposition Plan: Anticipate discharge after clinical improvement. Consults called: None Admission status: Observation, telemetry  This chart was dictated using voice recognition software.  Despite best efforts to proofread, errors can occur which can change the documentation meaning.  Shela Leff MD [facility] Pager [phone]  If 7PM-7AM, please contact night-coverage [email or web address] Password [facility]  08/15/2018, 10:46 PM

## 2018-08-15 NOTE — Progress Notes (Signed)
Pharmacy Antibiotic Note  Travis [name]. is a 68 y.o. male admitted on 08/15/2018 with back pain, fall, fever. Pharmacy has been consulted for vancomycin and cefepime dosing for possible epidural abscess.  Plan: Vancomycin 2570m IV x 1 then 17564mIV Q24H Cefepime 2gm IV Q8H F/u renal fxn, C&S, clinical status and trough at SS  Height: 6' (182.9 cm) Weight: (!) 326 lb 15.1 oz (148.3 kg) IBW/kg (Calculated) : 77.6  Temp (24hrs), Avg:100.3 F (37.9 C), Min:99.1 F (37.3 C), Max:101.4 F (38.6 C)  Recent Labs  Lab 08/15/18 1653 08/15/18 2004  WBC 6.5  --   CREATININE 1.28*  --   LATICACIDVEN 1.1 0.7    Estimated Creatinine Clearance: 85 mL/min (A) (by C-G formula based on SCr of 1.28 mg/dL (H)).    No Known Allergies  Antimicrobials this admission: Vanc 3/26>> Cefepime 3/26>> CTX x 1 3/26  Dose adjustments this admission: N/A  Microbiology results: Pending  Thank you for allowing pharmacy to be a part of this patient's care.  Chandrea Zellman, RaRande Lawman/26/2020 10:22 PM

## 2018-08-15 NOTE — ED Notes (Signed)
Patient transported to CT 

## 2018-08-15 NOTE — ED Notes (Signed)
Pt given sandwich bag and milk

## 2018-08-15 NOTE — ED Provider Notes (Signed)
  Face-to-face evaluation   History: He presents for evaluation after a fall this morning, when he was using his walker to get his medicines.  He aggravated ongoing pain in his right upper back, when he fell.  He was unable to get up without assistance.  EMS was summoned they helped him into bed where he stayed until he decided to call them again and come here for evaluation.  He complains of fever, and dry cough.  He denies nausea, vomiting, focal weakness or paresthesia.  He states he had back surgery 1 and half months ago.  He is getting rehab, at home and the therapists encouraged him to come in for evaluation today.  No known sick contacts.  Physical exam: Morbidly obese, alert and cooperative.  No dysarthria or aphasia.  Lungs clear anteriorly.  Back mildly tender between the right scapula, and spine.  No deformity of the back.  9:20 PM-case discussed with hospitalist who will evaluate and treat the patient with likely admission.  MDM-patient with fall today, and nonspecific weakness, recovering from lumbar surgery.  Doubt lumbar myelopathy.  No serious injury from fall.  Patient with fever and hypotension, doubt severe sepsis, lactate normal.  Troponin elevated, unchanged, in the face of mild renal insufficiency.  Suspect volume depleted, likely contributing to fall.  Possible UTI, but no urinary tract symptoms.  Patient will require admission for further treatment and evaluation. Travis FAUCETT Sr. was evaluated in Emergency Department on 08/15/2018 for the symptoms described in the history of present illness. He was evaluated in the context of the global COVID-19 pandemic, which necessitated consideration that the patient might be at risk for infection with the SARS-CoV-2 virus that causes COVID-19. Institutional protocols and algorithms that pertain to the evaluation of patients at risk for COVID-19 are in a state of rapid change based on information released by regulatory bodies including  the CDC and federal and state organizations. These policies and algorithms were followed during the patient's care in the ED.  Medical screening examination/treatment/procedure(s) were conducted as a shared visit with non-physician practitioner(s) and myself.  I personally evaluated the patient during the encounter    Daleen Bo, MD 08/15/18 2223

## 2018-08-15 NOTE — ED Notes (Signed)
ED TO INPATIENT HANDOFF REPORT  ED Nurse Name and Phone #: Lovena Le [phone]  S Name/Age/Gender Travis Novak Sr. 68 y.o. male Room/Bed: 045C/045C  Code Status   Code Status: Prior  Home/SNF/Other Home Patient oriented to: self, place, time and situation Is this baseline? Yes   Triage Complete: Triage complete  Chief Complaint Fall; Chest Pain  Triage Note Pt from home with ems for fall that occurred around 1am along with central chest pain, chronic back pain and fever, pt states he had a fever of 104.2 yesterday. Denies any cough. P incontinent of urine . 324 asa and 1 nitro given en route. Pt a.o   Allergies No Known Allergies  Level of Care/Admitting Diagnosis ED Disposition    ED Disposition Condition Comment   Admit  Hospital Area: [facility] [[id number]]  Level of Care: Telemetry Medical [104]  I expect the patient will be discharged within 24 hours: Yes  LOW acuity---Tx typically complete <24 hrs---ACUTE conditions typically can be evaluated <24 hours---LABS likely to return to acceptable levels <24 hours---IS near functional baseline---EXPECTED to return to current living arrangement---NOT newly hypoxic: Meets criteria for 5C-Observation unit  Diagnosis: Fever [344092]  Admitting Physician: Shela Leff [[id number]]  Attending Physician: Shela Leff [[id number]]  Bed request comments: Low risk COVID-19  PT Class (Do Not Modify): Observation [104]  PT Acc Code (Do Not Modify): Observation [10022]       B Medical/Surgery History Past Medical History:  Diagnosis Date  . ALLERGIC RHINITIS 06/24/2009  . Anxiety 11/12/2011   Adequate for discharge   . Arthritis    "all my joints" (09/30/2013)  . Arthritis of foot, right, degenerative 04/15/2014  . Balance disorder 03/12/2013  . Benign neoplasm of cecum   . Benign neoplasm of descending colon   . CAD (coronary artery disease) 06/24/2009   5 stents placed in 2007    . Chronic  anticoagulation   . Chronic pain syndrome 10/27/2009   of ankle, shoulders, low back.  sciatica.   . Closed fracture of right foot 10/17/2014  . CORONARY ARTERY DISEASE 06/24/2009   a. s/p multiple PCIs - In 2008 he had a Taxus DES to the mild LAD, Endeavor DES to mid LCX and distal LCX. In January 2009 he had DES to distal LCX, mid LCX and proximal LCX. In November 2009 had BMS x 2 to the mid RCA. Cath 10/2011 with patent stents, noncardiac CP. LHC 01/2013: patent stents (noncardiac CP).  . DEGENERATIVE JOINT DISEASE 06/24/2009   Qualifier: Diagnosis of  By: [name], [name]   . Depression   . Depression with anxiety    Prior suicide attempt  . [place] DISEASE, LUMBAR 04/19/2010  . ERECTILE DYSFUNCTION, ORGANIC 05/30/2010  . Essential hypertension 06/24/2009   Qualifier: Diagnosis of  By: [name], [name]   . Fibromyalgia   . Fracture dislocation of ankle joint 09/02/2015  . Gait disorder 03/12/2013  . General weakness 07/14/2014  . GERD (gastroesophageal reflux disease) 09/08/2015  . Hand joint pain 06/10/2013  . Heart murmur   . Hepatitis C   . History of kidney stones   . Hyperlipidemia 07/15/2009   Qualifier: Diagnosis of  By: Aundra Dubin, MD, [place]    . HYPERLIPIDEMIA-MIXED 07/15/2009  . HYPERTENSION 06/24/2009  . Insomnia 10/04/2011  . Irregular heart beat   . Left hip pain 03/12/2013   Injected under ultrasound guidance on June 24, 2013   . Major depression 09/13/2015  . Myocardial infarction ([place]) 2008  .  Non-cardiac chest pain 10/2011, 01/2013  . Obesity   . Occult blood positive stool 10/17/2014  . Open ankle fracture 09/02/2015  . OSA (obstructive sleep apnea)    not using CPAP (09/30/2013)  . Pain of right thumb 04/03/2013  . Pneumonia   . PPD positive 04/08/2015  . Pre-ulcerative corn or callous 02/06/2013  . Rotator cuff tear arthropathy of both shoulders 06/10/2013   History of bilateral shoulder cuff surgery for rotator cuff tears. Reports increase in pain 09/11/2015 during physical  therapy of the left shoulder.   [name] SCIATICA, LEFT 04/19/2010   Qualifier: Diagnosis of  By: [name], [name]   . Spinal stenosis in cervical region 09/26/2013  . Spinal stenosis, lumbar region, with neurogenic claudication 09/26/2013  . Type II diabetes mellitus ([place]) 2012   no meds in 09/2014.   [name] Uncontrolled type 2 DM with peripheral circulatory disorder ([place]) 10/04/2013  . URETHRAL STRICTURE 06/24/2009   self catheterizes.    Past Surgical History:  Procedure Laterality Date  . AMPUTATION Right 06/14/2016   Procedure: AMPUTATION BELOW KNEE;  Surgeon: Newt Minion, MD;  Location: [facility];  Service: Orthopedics;  Laterality: Right;  . ANKLE FUSION Right 04/15/2014   Procedure: Right Subtalar, Talonavicular Fusion;  Surgeon: Newt Minion, MD;  Location: [facility];  Service: Orthopedics;  Laterality: Right;  . ANKLE FUSION Right 04/18/2016   Procedure: Right Ankle Tibiocalcaneal Fusion;  Surgeon: Newt Minion, MD;  Location: [facility];  Service: Orthopedics;  Laterality: Right;  . ANTERIOR CERVICAL DECOMP/DISCECTOMY FUSION N/A 09/26/2013   Procedure: ANTERIOR CERVICAL DISCECTOMY FUSION C3-4, plate and screw fixation, allograft bone graft;  Surgeon: Jessy Oto, MD;  Location: [facility];  Service: Orthopedics;  Laterality: N/A;  . BACK SURGERY    . BELOW KNEE LEG AMPUTATION Right 06/14/2016  . CARDIAC CATHETERIZATION  X 1  . CARPAL TUNNEL RELEASE Bilateral   . COLONOSCOPY    . COLONOSCOPY N/A 10/22/2014   Procedure: COLONOSCOPY;  Surgeon: Lafayette Dragon, MD;  Location: [facility] ENDOSCOPY;  Service: Endoscopy;  Laterality: N/A;  . CORONARY ANGIOPLASTY WITH STENT PLACEMENT     "I have 9 stents"  . ESOPHAGOGASTRODUODENOSCOPY N/A 10/19/2014   Procedure: ESOPHAGOGASTRODUODENOSCOPY (EGD);  Surgeon: Jerene Bears, MD;  Location: [facility] ENDOSCOPY;  Service: Endoscopy;  Laterality: N/A;  . FRACTURE SURGERY    . FUSION OF TALONAVICULAR JOINT Right 04/15/2014   dr duda  . HERNIA REPAIR     umbilical  . INGUINAL HERNIA REPAIR  Right 05/11/2015   Procedure: LAPAROSCOPIC REPAIR RIGHT  INGUINAL HERNIA;  Surgeon: Greer Pickerel, MD;  Location: [facility];  Service: General;  Laterality: Right;  . INSERTION OF MESH Right 05/11/2015   Procedure: INSERTION OF MESH;  Surgeon: Greer Pickerel, MD;  Location: [facility];  Service: General;  Laterality: Right;  . JOINT REPLACEMENT    . KNEE CARTILAGE SURGERY Right X 12   "~ 1/2 open; ~ 1/2 scopes"  . KNEE CARTILAGE SURGERY Left X 3   "3 scopes"  . LEFT HEART CATHETERIZATION WITH CORONARY ANGIOGRAM N/A 02/10/2013   Procedure: LEFT HEART CATHETERIZATION WITH CORONARY ANGIOGRAM;  Surgeon: Burnell Blanks, MD;  Location: [facility] C[facility] LAB;  Service: Cardiovascular;  Laterality: N/A;  . LUMBAR LAMINECTOMY N/A 07/16/2018   Procedure: LEFT L4-5 REDO PARTIAL LUMBAR HEMILAMINECTOMY WITH FORAMINOTOMY LEFT L4;  Surgeon: Jessy Oto, MD;  Location: [facility];  Service: Orthopedics;  Laterality: N/A;  . LUMBAR LAMINECTOMY/DECOMPRESSION MICRODISCECTOMY N/A 01/27/2014   Procedure:  CENTRAL LUMBAR LAMINECTOMY L4-5 AND L3-4;  Surgeon: Jessy Oto, MD;  Location: [facility];  Service: Orthopedics;  Laterality: N/A;  . ORIF ANKLE FRACTURE Right 09/02/2015   Procedure: OPEN REDUCTION INTERNAL FIXATION (ORIF) ANKLE FRACTURE;  Surgeon: Newt Minion, MD;  Location: [facility];  Service: Orthopedics;  Laterality: Right;  . PERIPHERALLY INSERTED CENTRAL CATHETER INSERTION  09/02/2015  . SHOULDER ARTHROSCOPY W/ ROTATOR CUFF REPAIR Bilateral    "3 on the right; 1 on the left"  . SKIN SPLIT GRAFT Right 10/01/2015   Procedure: RIGHT ANKLE APPLY SKIN GRAFT SPLIT THICKNESS;  Surgeon: Newt Minion, MD;  Location: [facility];  Service: Orthopedics;  Laterality: Right;  . TONSILLECTOMY    . TOOTH EXTRACTION    . TOTAL HIP ARTHROPLASTY Right 04/17/2018  . TOTAL HIP ARTHROPLASTY Right 04/17/2018   Procedure: RIGHT TOTAL HIP ARTHROPLASTY;  Surgeon: Newt Minion, MD;  Location: [facility];  Service: Orthopedics;  Laterality: Right;  . TOTAL KNEE  ARTHROPLASTY Bilateral 2008  . UMBILICAL HERNIA REPAIR     UHR  . URETHRAL DILATION  X 4  . VASECTOMY    . WISDOM TOOTH EXTRACTION       A IV Location/Drains/Wounds Patient Lines/Drains/Airways Status   Active Line/Drains/Airways    Name:   Placement date:   Placement time:   Site:   Days:   Peripheral IV 08/15/18 Left Hand   08/15/18    1634    Hand   less than 1   Peripheral IV 08/15/18 Right Forearm   08/15/18    1652    Forearm   less than 1   External Urinary Catheter   08/15/18    1810    -   less than 1   Incision (Closed) 10/01/15 Ankle Right   10/01/15    1559     1049   Incision (Closed) 04/18/16 Ankle Right   04/18/16    1302     849   Incision (Closed) 06/14/16 Leg Right   06/14/16    0907     792   Incision (Closed) 04/17/18 Hip Right   04/17/18    0833     120   Incision (Closed) 07/16/18 Back Other (Comment)   07/16/18    1015     30          Intake/Output Last 24 hours  Intake/Output Summary (Last 24 hours) at 08/15/2018 2148 Last data filed at 08/15/2018 2056 Gross per 24 hour  Intake 1500 ml  Output -  Net 1500 ml    Labs/Imaging Results for orders placed or performed during the hospital encounter of 08/15/18 (from the past 48 hour(s))  Lactic acid, plasma     Status: None   Collection Time: 08/15/18  4:53 PM  Result Value Ref Range   Lactic Acid, Venous 1.1 0.5 - 1.9 mmol/L    Comment: Performed at [facility], [place] [address] [place], [place] [address]  Comprehensive metabolic panel     Status: Abnormal   Collection Time: 08/15/18  4:53 PM  Result Value Ref Range   Sodium 133 (L) 135 - 145 mmol/L   Potassium 3.1 (L) 3.5 - 5.1 mmol/L   Chloride 97 (L) 98 - 111 mmol/L   CO2 26 22 - 32 mmol/L   Glucose, Bld 156 (H) 70 - 99 mg/dL   BUN 21 8 - 23 mg/dL   Creatinine, Ser 1.28 (H) 0.61 - 1.24 mg/dL   Calcium  8.6 (L) 8.9 - 10.3 mg/dL   Total Protein 6.3 (L) 6.5 - 8.1 g/dL   Albumin 3.4 (L) 3.5 - 5.0 g/dL   AST 34 15 - 41 U/L   ALT 19 0 - 44  U/L   Alkaline Phosphatase 60 38 - 126 U/L   Total Bilirubin 1.0 0.3 - 1.2 mg/dL   GFR calc non Af Amer 58 (L) >60 mL/min   GFR calc Af Amer >60 >60 mL/min   Anion gap 10 5 - 15    Comment: Performed at [facility] [address] [place], [place] [address]  CBC WITH DIFFERENTIAL     Status: Abnormal   Collection Time: 08/15/18  4:53 PM  Result Value Ref Range   WBC 6.5 4.0 - 10.5 K/uL   RBC 4.00 (L) 4.22 - 5.81 MIL/uL   Hemoglobin 10.6 (L) 13.0 - 17.0 g/dL   HCT 33.3 (L) 39.0 - 52.0 %   MCV 83.3 80.0 - 100.0 fL   MCH 26.5 26.0 - 34.0 pg   MCHC 31.8 30.0 - 36.0 g/dL   RDW 15.0 11.5 - 15.5 %   Platelets 168 150 - 400 K/uL   nRBC 0.0 0.0 - 0.2 %   Neutrophils Relative % 89 %   Neutro Abs 5.7 1.7 - 7.7 K/uL   Lymphocytes Relative 5 %   Lymphs Abs 0.4 (L) 0.7 - 4.0 K/uL   Monocytes Relative 6 %   Monocytes Absolute 0.4 0.1 - 1.0 K/uL   Eosinophils Relative 0 %   Eosinophils Absolute 0.0 0.0 - 0.5 K/uL   Basophils Relative 0 %   Basophils Absolute 0.0 0.0 - 0.1 K/uL   Immature Granulocytes 0 %   Abs Immature Granulocytes 0.02 0.00 - 0.07 K/uL    Comment: Performed at [facility], [place] [address], [place], [place] [address]  I-Stat Troponin, ED (not at [facility])     Status: Abnormal   Collection Time: 08/15/18  4:59 PM  Result Value Ref Range   Troponin i, poc 0.10 (HH) 0.00 - 0.08 ng/mL   Comment NOTIFIED PHYSICIAN    Comment 3            Comment: Due to the release kinetics of cTnI, a negative result within the first hours of the onset of symptoms does not rule out myocardial infarction with certainty. If myocardial infarction is still suspected, repeat the test at appropriate intervals.   Urinalysis, Routine w reflex microscopic     Status: Abnormal   Collection Time: 08/15/18  6:00 PM  Result Value Ref Range   Color, Urine AMBER (A) YELLOW    Comment: BIOCHEMICALS MAY BE AFFECTED BY COLOR   APPearance HAZY (A) CLEAR   Specific Gravity, Urine 1.024 1.005 -  1.030   pH 5.0 5.0 - 8.0   Glucose, UA NEGATIVE NEGATIVE mg/dL   Hgb urine dipstick SMALL (A) NEGATIVE   Bilirubin Urine NEGATIVE NEGATIVE   Ketones, ur NEGATIVE NEGATIVE mg/dL   Protein, ur NEGATIVE NEGATIVE mg/dL   Nitrite NEGATIVE NEGATIVE   Leukocytes,Ua SMALL (A) NEGATIVE   RBC / HPF 6-10 0 - 5 RBC/hpf   WBC, UA 6-10 0 - 5 WBC/hpf   Bacteria, UA FEW (A) NONE SEEN   Mucus PRESENT    Hyaline Casts, UA PRESENT    Non Squamous Epithelial 0-5 (A) NONE SEEN    Comment: Performed at [facility], [address] [place], [place] [address]  Influenza panel by  PCR (type A & B)     Status: None   Collection Time: 08/15/18  7:25 PM  Result Value Ref Range   Influenza A By PCR NEGATIVE NEGATIVE   Influenza B By PCR NEGATIVE NEGATIVE    Comment: (NOTE) The Xpert Xpress Flu assay is intended as an aid in the diagnosis of  influenza and should not be used as a sole basis for treatment.  This  assay is FDA approved for nasopharyngeal swab specimens only. Nasal  washings and aspirates are unacceptable for Xpert Xpress Flu testing. Performed at [facility], [place] [address] [place], [place] [address]   Lactic acid, plasma     Status: None   Collection Time: 08/15/18  8:04 PM  Result Value Ref Range   Lactic Acid, Venous 0.7 0.5 - 1.9 mmol/L    Comment: Performed at [facility] [address], [place], [place] [address]  I-Stat Troponin, ED (not at [facility])     Status: None   Collection Time: 08/15/18  8:16 PM  Result Value Ref Range   Troponin i, poc 0.08 0.00 - 0.08 ng/mL   Comment 3            Comment: Due to the release kinetics of cTnI, a negative result within the first hours of the onset of symptoms does not rule out myocardial infarction with certainty. If myocardial infarction is still suspected, repeat the test at appropriate intervals.    Ct Head Wo Contrast  Result Date: 08/15/2018 CLINICAL DATA:  Recent fall with headaches, initial encounter EXAM: CT  HEAD WITHOUT CONTRAST TECHNIQUE: Contiguous axial images were obtained from the base of the skull through the vertex without intravenous contrast. COMPARISON:  04/23/2014 FINDINGS: Brain: No evidence of acute infarction, hemorrhage, hydrocephalus, extra-axial collection or mass lesion/mass effect. Vascular: No hyperdense vessel or unexpected calcification. Skull: Normal. Negative for fracture or focal lesion. Sinuses/Orbits: Paranasal sinuses demonstrates mucosal thickening as well as prior surgical changes in the maxillary antra bilaterally. Other: None. IMPRESSION: No acute intracranial abnormality noted. Mild sinus disease. Electronically Signed   By: Inez Catalina M.D.   On: 08/15/2018 18:15   Dg Chest Port 1 View  Result Date: 08/15/2018 CLINICAL DATA:  Chest pain and fever for 2 days EXAM: PORTABLE CHEST 1 VIEW COMPARISON:  01/14/2018 FINDINGS: Cardiac shadow is stable. The lungs are well aerated bilaterally. No focal infiltrate or sizable effusion is seen. No acute bony abnormality is noted. IMPRESSION: No active disease. Electronically Signed   By: Inez Catalina M.D.   On: 08/15/2018 17:13    Pending Labs Unresulted Labs (From admission, onward)    Start     Ordered   08/15/18 1640  Blood Culture (routine x 2)  BLOOD CULTURE X 2,   STAT     08/15/18 1640   08/15/18 1640  Urine culture  ONCE - STAT,   STAT     08/15/18 1640          Vitals/Pain Today's Vitals   08/15/18 2011 08/15/18 2030 08/15/18 2056 08/15/18 2100  BP: (!) 103/41 92/77  (!) 105/55  Pulse: 71 72  74  Resp: 14 14  12   Temp:      TempSrc:      SpO2: 96% 98%  98%  Weight:      Height:      PainSc:   7      Isolation Precautions Droplet precaution  Medications Medications  sodium chloride 0.9 % bolus  500 mL (0 mLs Intravenous Stopped 08/15/18 1925)  acetaminophen (TYLENOL) tablet 1,000 mg (1,000 mg Oral Given 08/15/18 1832)  fentaNYL (SUBLIMAZE) injection 50 mcg (50 mcg Intravenous Given 08/15/18 1920)  sodium  chloride 0.9 % bolus 500 mL (0 mLs Intravenous Stopped 08/15/18 2015)  cefTRIAXone (ROCEPHIN) 1 g in sodium chloride 0.9 % 100 mL IVPB (0 g Intravenous Stopped 08/15/18 1958)  sodium chloride 0.9 % bolus 500 mL (0 mLs Intravenous Stopped 08/15/18 2056)    Mobility walks with device High fall risk   Focused Assessments Cardiac Assessment Handoff:  Cardiac Rhythm: Normal sinus rhythm Lab Results  Component Value Date   CKTOTAL 60 03/25/2013   TROPONINI 0.06 (H) 10/18/2014   No results found for: DDIMER Does the Patient currently have chest pain? No     R Recommendations: See Admitting Provider Note  Report given to:   Additional Notes: Pt having chronic back pain at this time, recent surgery on Lumbar spine, given fentanyl with little relief of pain. Condom cath in place, incontinent of urine. Right BKA.

## 2018-08-15 NOTE — Telephone Encounter (Signed)
Patient in the ED at [facility].

## 2018-08-15 NOTE — ED Provider Notes (Signed)
[facility] EMERGENCY DEPARTMENT Provider Note   CSN: [id number] Arrival date & time: 08/15/18  1620    History   Chief Complaint Chief Complaint  Patient presents with   Chest Pain   Fall   Back Pain   Fever    HPI Travis Bohanon. is a 68 y.o. male with history of coronary artery disease, fibromyalgia, GERD, hyperlipidemia, hypertension, chronic back pain, type 2 diabetes mellitus, peripheral arterial disease status post right BKA, CHF presents for evaluation of multiple complaints.  He reports acutely developing fever yesterday with maximum temperature of 104 F.  Also notes some dysuria and hematuria beginning yesterday.  Denies abdominal pain, nausea, or vomiting.  Took Tylenol yesterday with some improvement.  Reports that while ambulating at around 1 AM this morning his legs "gave out "resulting in a fall in which he landed on his buttocks, back, and posterior aspect of his head.  Denies loss of consciousness.  He is currently taking Effient, an antiplatelet agent.  Denies headache or neck pain.  He reports longstanding issues with urinary incontinence which are no worse today than usual. Has had a nonproductive cough for "a few days".  Denies saddle anesthesia.  Reports he has had some weakness in his lower extremities chronically and underwent low back surgery 6 months ago.  He is complaining of some pain along the right posterior thoracic region which worsens with movement and deep inspiration since the fall.    He is also complaining of some chest pain just to the right of the sternum which he describes as a pressure and feels different from chest pain he has experienced previously.  Chest pain began yesterday and has been constant, waxing and waning.  Denies any associated shortness of breath, nausea, vomiting, diaphoresis, or lightheadedness.  No aggravating or alleviating factors noted.Apparently EMS was called out after he fell but discouraged  transfer to the hospital due to current coronavirus pandemic.  He then called out for EMS again just prior to arrival here as pain in the thoracic region worsened.  He was given aspirin and 1 sublingual nitroglycerin but became subsequently hypotensive and so he did not receive any more doses of nitroglycerin.  He reports improvement in his pain with the nitroglycerin.  He is a non-smoker, drinks alcohol rarely, denies recreational drug use.     The history is provided by the patient.    Past Medical History:  Diagnosis Date   ALLERGIC RHINITIS 06/24/2009   Anxiety 11/12/2011   Adequate for discharge    Arthritis    "all my joints" (09/30/2013)   Arthritis of foot, right, degenerative 04/15/2014   Balance disorder 03/12/2013   Benign neoplasm of cecum    Benign neoplasm of descending colon    CAD (coronary artery disease) 06/24/2009   5 stents placed in 2007     Chronic anticoagulation    Chronic pain syndrome 10/27/2009   of ankle, shoulders, low back.  sciatica.    Closed fracture of right foot 10/17/2014   CORONARY ARTERY DISEASE 06/24/2009   a. s/p multiple PCIs - In 2008 he had a Taxus DES to the mild LAD, Endeavor DES to mid LCX and distal LCX. In January 2009 he had DES to distal LCX, mid LCX and proximal LCX. In November 2009 had BMS x 2 to the mid RCA. Cath 10/2011 with patent stents, noncardiac CP. LHC 01/2013: patent stents (noncardiac CP).   DEGENERATIVE JOINT DISEASE 06/24/2009   Qualifier: Diagnosis of  ByJenny Reichmann MD, [name]    Depression    Depression with anxiety    Prior suicide attempt   [place], LUMBAR 04/19/2010   ERECTILE DYSFUNCTION, ORGANIC 05/30/2010   Essential hypertension 06/24/2009   Qualifier: Diagnosis of  By: [name], [name]    Fibromyalgia    Fracture dislocation of ankle joint 09/02/2015   Gait disorder 03/12/2013   General weakness 07/14/2014   GERD (gastroesophageal reflux disease) 09/08/2015   Hand joint pain 06/10/2013   Heart  murmur    Hepatitis C    History of kidney stones    Hyperlipidemia 07/15/2009   Qualifier: Diagnosis of  By: Aundra Dubin, MD, [place]     HYPERLIPIDEMIA-MIXED 07/15/2009   HYPERTENSION 06/24/2009   Insomnia 10/04/2011   Irregular heart beat    Left hip pain 03/12/2013   Injected under ultrasound guidance on June 24, 2013    Major depression 09/13/2015   Myocardial infarction [facility]) 2008   Non-cardiac chest pain 10/2011, 01/2013   Obesity    Occult blood positive stool 10/17/2014   Open ankle fracture 09/02/2015   OSA (obstructive sleep apnea)    not using CPAP (09/30/2013)   Pain of right thumb 04/03/2013   Pneumonia    PPD positive 04/08/2015   Pre-ulcerative corn or callous 02/06/2013   Rotator cuff tear arthropathy of both shoulders 06/10/2013   History of bilateral shoulder cuff surgery for rotator cuff tears. Reports increase in pain 09/11/2015 during physical therapy of the left shoulder.    SCIATICA, LEFT 04/19/2010   Qualifier: Diagnosis of  By: [name], [name]    Spinal stenosis in cervical region 09/26/2013   Spinal stenosis, lumbar region, with neurogenic claudication 09/26/2013   Type II diabetes mellitus ([place]) 2012   no meds in 09/2014.    Uncontrolled type 2 DM with peripheral circulatory disorder ([place]) 10/04/2013   URETHRAL STRICTURE 06/24/2009   self catheterizes.     Patient Active Problem List   Diagnosis Date Noted   Bowel incontinence 07/25/2018   Other spondylosis with radiculopathy, lumbar region 07/16/2018    Class: Chronic   Status post lumbar laminectomy 07/16/2018   Status post THR (total hip replacement) 04/17/2018   Unilateral primary osteoarthritis, right hip    Morbid obesity with BMI of 40.0-44.9, adult ([place]) 02/28/2018   Myocardial infarction ([place]) 02/25/2018   Coronary artery disease involving native coronary artery of native heart 02/25/2018   PAD (peripheral artery disease) ([place]) 02/25/2018   S/P BKA (below knee  amputation) unilateral, right ([place]) 02/25/2018   Sacroiliitis ([place]) 02/25/2018   SOB (shortness of breath) 01/03/2018   Acute on chronic heart failure ([place]) 01/03/2018   Severe right groin pain 12/20/2017   Morbid obesity ([place]) 10/09/2017   Exposure to sexually transmitted disease (STD) 07/14/2017   Low back pain 07/13/2017   Leukoplakia, tongue 01/26/2017   Acute sinus infection 10/20/2016   Hyperglycemia 10/20/2016   Skin lesion 10/20/2016   S/P unilateral BKA (below knee amputation), right ([place]) 06/14/2016   Charcot foot due to diabetes mellitus ([place])    Charcot's arthropathy associated with type 2 diabetes mellitus ([place]) 04/11/2016   Preventative health care 11/05/2015   Major depression 09/13/2015   S/P TKR (total knee replacement) bilaterally 09/13/2015   GERD (gastroesophageal reflux disease) 09/08/2015   S/P laparoscopic hernia repair 05/11/2015   PPD positive 04/08/2015   Benign neoplasm of descending colon    Benign neoplasm of cecum    AKI (acute kidney injury) ([place])  10/18/2014   Acute blood loss anemia    Chronic anticoagulation    Occult blood positive stool 10/17/2014   General weakness 07/14/2014   Urinary incontinence 07/14/2014   Headache(784.0) 10/15/2013   Spinal stenosis in cervical region 09/26/2013    Class: Chronic   Spinal stenosis of lumbar region 09/26/2013    Class: Chronic   Hand joint pain 06/10/2013   Rotator cuff tear arthropathy of both shoulders 06/10/2013   Skin lesion of cheek 05/01/2013   Pain of right thumb 04/03/2013   Balance disorder 03/12/2013   Gait disorder 03/12/2013   Tremor 03/12/2013   Left hip pain 03/12/2013   Pre-ulcerative corn or callous 02/06/2013   Anxiety 11/12/2011   OSA (obstructive sleep apnea) 11/07/2011   Bradycardia 10/20/2011   Insomnia 10/04/2011   Obesity 01/12/2011   ERECTILE DYSFUNCTION, ORGANIC 05/30/2010   DISC DISEASE, LUMBAR 04/19/2010   SCIATICA,  LEFT 04/19/2010   Chronic pain syndrome 10/27/2009   Hyperlipidemia 07/15/2009   Essential hypertension 06/24/2009   Coronary artery disease involving native coronary artery of native heart without angina pectoris 06/24/2009   Allergic rhinitis 06/24/2009   URETHRAL STRICTURE 06/24/2009   DEGENERATIVE JOINT DISEASE 06/24/2009   SHOULDER PAIN, BILATERAL 06/24/2009   FATIGUE 06/24/2009   NEPHROLITHIASIS, HX OF 06/24/2009    Past Surgical History:  Procedure Laterality Date   AMPUTATION Right 06/14/2016   Procedure: AMPUTATION BELOW KNEE;  Surgeon: Newt Minion, MD;  Location: [place];  Service: Orthopedics;  Laterality: Right;   ANKLE FUSION Right 04/15/2014   Procedure: Right Subtalar, Talonavicular Fusion;  Surgeon: Newt Minion, MD;  Location: [place];  Service: Orthopedics;  Laterality: Right;   ANKLE FUSION Right 04/18/2016   Procedure: Right Ankle Tibiocalcaneal Fusion;  Surgeon: Newt Minion, MD;  Location: [place];  Service: Orthopedics;  Laterality: Right;   ANTERIOR CERVICAL DECOMP/DISCECTOMY FUSION N/A 09/26/2013   Procedure: ANTERIOR CERVICAL DISCECTOMY FUSION C3-4, plate and screw fixation, allograft bone graft;  Surgeon: Jessy Oto, MD;  Location: [place];  Service: Orthopedics;  Laterality: N/A;   BACK SURGERY     BELOW KNEE LEG AMPUTATION Right 06/14/2016   CARDIAC CATHETERIZATION  X 1   CARPAL TUNNEL RELEASE Bilateral    COLONOSCOPY     COLONOSCOPY N/A 10/22/2014   Procedure: COLONOSCOPY;  Surgeon: Lafayette Dragon, MD;  Location: [facility] ENDOSCOPY;  Service: Endoscopy;  Laterality: N/A;   CORONARY ANGIOPLASTY WITH STENT PLACEMENT     "I have 9 stents"   ESOPHAGOGASTRODUODENOSCOPY N/A 10/19/2014   Procedure: ESOPHAGOGASTRODUODENOSCOPY (EGD);  Surgeon: Jerene Bears, MD;  Location: [facility] ENDOSCOPY;  Service: Endoscopy;  Laterality: N/A;   FRACTURE SURGERY     FUSION OF TALONAVICULAR JOINT Right 04/15/2014   dr duda   HERNIA REPAIR     umbilical   INGUINAL  HERNIA REPAIR Right 05/11/2015   Procedure: LAPAROSCOPIC REPAIR RIGHT  INGUINAL HERNIA;  Surgeon: Greer Pickerel, MD;  Location: [place];  Service: General;  Laterality: Right;   INSERTION OF MESH Right 05/11/2015   Procedure: INSERTION OF MESH;  Surgeon: Greer Pickerel, MD;  Location: [place];  Service: General;  Laterality: Right;   JOINT REPLACEMENT     KNEE CARTILAGE SURGERY Right X 12   "~ 1/2 open; ~ 1/2 scopes"   KNEE CARTILAGE SURGERY Left X 3   "3 scopes"   LEFT HEART CATHETERIZATION WITH CORONARY ANGIOGRAM N/A 02/10/2013   Procedure: LEFT HEART CATHETERIZATION WITH CORONARY ANGIOGRAM;  Surgeon: Burnell Blanks, MD;  Location: [place] CATH LAB;  Service: Cardiovascular;  Laterality: N/A;   LUMBAR LAMINECTOMY N/A 07/16/2018   Procedure: LEFT L4-5 REDO PARTIAL LUMBAR HEMILAMINECTOMY WITH FORAMINOTOMY LEFT L4;  Surgeon: Jessy Oto, MD;  Location: [place];  Service: Orthopedics;  Laterality: N/A;   LUMBAR LAMINECTOMY/DECOMPRESSION MICRODISCECTOMY N/A 01/27/2014   Procedure: CENTRAL LUMBAR LAMINECTOMY L4-5 AND L3-4;  Surgeon: Jessy Oto, MD;  Location: [place];  Service: Orthopedics;  Laterality: N/A;   ORIF ANKLE FRACTURE Right 09/02/2015   Procedure: OPEN REDUCTION INTERNAL FIXATION (ORIF) ANKLE FRACTURE;  Surgeon: Newt Minion, MD;  Location: [place];  Service: Orthopedics;  Laterality: Right;   PERIPHERALLY INSERTED CENTRAL CATHETER INSERTION  09/02/2015   SHOULDER ARTHROSCOPY W/ ROTATOR CUFF REPAIR Bilateral    "3 on the right; 1 on the left"   SKIN SPLIT GRAFT Right 10/01/2015   Procedure: RIGHT ANKLE APPLY SKIN GRAFT SPLIT THICKNESS;  Surgeon: Newt Minion, MD;  Location: [place];  Service: Orthopedics;  Laterality: Right;   TONSILLECTOMY     TOOTH EXTRACTION     TOTAL HIP ARTHROPLASTY Right 04/17/2018   TOTAL HIP ARTHROPLASTY Right 04/17/2018   Procedure: RIGHT TOTAL HIP ARTHROPLASTY;  Surgeon: Newt Minion, MD;  Location: [place];  Service: Orthopedics;  Laterality: Right;     TOTAL KNEE ARTHROPLASTY Bilateral 6063   UMBILICAL HERNIA REPAIR     UHR   URETHRAL DILATION  X 4   VASECTOMY     WISDOM TOOTH EXTRACTION          Home Medications    Prior to Admission medications   Medication Sig Start Date End Date Taking? Authorizing Provider  clonazePAM (KLONOPIN) 0.5 MG tablet Take 1 tablet (0.5 mg total) by mouth at bedtime. 07/17/18  Yes Jessy Oto, MD  HYDROcodone-acetaminophen (NORCO/VICODIN) 5-325 MG tablet Take 1 tablet by mouth every 6 (six) hours as needed (for pain).   Yes [provider]  pantoprazole (PROTONIX) 40 MG tablet TAKE ONE TABLET BY MOUTH EVERY DAY Patient taking differently: Take 40 mg by mouth daily.  07/05/18  Yes [name], MD  traZODone (DESYREL) 150 MG tablet Take 1 tablet (150 mg total) by mouth at bedtime. 08/14/18  Yes Arfeen, Arlyce Harman, MD  amoxicillin (AMOXIL) 500 MG capsule Take 2,000 mg by mouth See admin instructions. Take 2000 mg by mouth 1 hour prior to dental appointment 06/26/18   [provider]  ARIPiprazole (ABILIFY) 5 MG tablet Take 1 tablet (5 mg total) by mouth at bedtime. 08/14/18   Arfeen, Arlyce Harman, MD  aspirin 81 MG tablet Take 81 mg by mouth at bedtime.     [provider]  atorvastatin (LIPITOR) 20 MG tablet Take 1 tablet (20 mg total) by mouth daily. Patient taking differently: Take 20 mg by mouth at bedtime.  01/03/18   End, Harrell Gave, MD  benztropine (COGENTIN) 0.5 MG tablet Take 1 tablet (0.5 mg total) by mouth at bedtime. 08/14/18 08/14/19  Arfeen, Arlyce Harman, MD  cyclobenzaprine (FLEXERIL) 5 MG tablet Take 1 tablet (5 mg total) by mouth 3 (three) times daily for 30 days. TAKE ONE TABLET BY MOUTH THREE TIMES DAILY AS NEEDED muscle spasm Patient taking differently: Take 5 mg by mouth 3 (three) times daily as needed for muscle spasms.  07/17/18 08/16/18  Jessy Oto, MD  DULoxetine (CYMBALTA) 30 MG capsule Take 1 capsule (30 mg total) by mouth 2 (two) times daily. 08/14/18   Arfeen, Arlyce Harman, MD  gabapentin (  NEURONTIN) 600 MG tablet Take 1 tablet (600 mg total) by mouth 3 (three) times daily. 07/19/18   Jessy Oto, MD  hydrochlorothiazide (HYDRODIURIL) 25 MG tablet Take 25 mg by mouth at bedtime.     [provider]  Melatonin 5 MG TABS Take 20 mg by mouth at bedtime.  12/31/17   [provider]  mirabegron ER (MYRBETRIQ) 50 MG TB24 tablet Take 50 mg by mouth at bedtime.     [provider]  Omega-3 Fatty Acids (FISH OIL) 1000 MG CAPS Take 1,000 mg by mouth at bedtime.     [provider]  oxyCODONE-acetaminophen (PERCOCET/ROXICET) 5-325 MG tablet Take 1-2 tablets by mouth every 4 (four) hours as needed. 07/17/18   Jessy Oto, MD  prasugrel (EFFIENT) 5 MG TABS tablet TAKE ONE TABLET BY MOUTH EVERY DAY Patient taking differently: Take 5 mg by mouth at bedtime.  10/25/17   End, Harrell Gave, MD  pregabalin (LYRICA) 150 MG capsule Take 150 mg by mouth 2 (two) times daily. 08/12/18   [provider]    Family History Family History  Problem Relation Age of Onset   Depression Mother    Heart disease Mother    Hypertension Mother    Breast cancer Mother    Diabetes Father    Heart disease Father        CABG   Hypertension Father    Hyperlipidemia Father    Prostate cancer Father    Skin cancer Father    Depression Brother        x 2   Hypertension Brother        x2   Healthy Son    Heart disease Maternal Grandfather    Early death Maternal Grandfather    Heart attack Maternal Grandfather 69   Early death Paternal Grandfather    Coronary artery disease Other    Hypertension Other    Depression Other    Healthy Son     Social History Social History   Tobacco Use   Smoking status: Former Smoker    Types: Cigars    Last attempt to quit: 08/28/2010    Years since quitting: 7.9   Smokeless tobacco: Never Used   Tobacco comment: 04/18/2016 "smoked 1 cigar/wk when I did smoke"  Substance Use Topics    Alcohol use: Yes    Alcohol/week: 0.0 standard drinks    Comment: rarely   Drug use: No     Allergies   Patient has no known allergies.   Review of Systems Review of Systems  Constitutional: Positive for chills and fever.  Respiratory: Positive for cough. Negative for shortness of breath.   Cardiovascular: Positive for chest pain.  Gastrointestinal: Negative for abdominal pain, nausea and vomiting.  Genitourinary: Positive for dysuria and hematuria. Negative for penile swelling and scrotal swelling.  Musculoskeletal: Positive for back pain.  Neurological: Negative for syncope and light-headedness.     Physical Exam Updated Vital Signs BP 92/77    Pulse 72    Temp 99.1 F (37.3 C) (Oral)    Resp 14    Ht 6' (1.829 m)    Wt (!) 148.3 kg    SpO2 98%    BMI 44.34 kg/m   Physical Exam Vitals signs and nursing note reviewed.  Constitutional:      General: He is not in acute distress.    Appearance: He is well-developed. He is obese.  HENT:     Head: Normocephalic and atraumatic.  Comments: No Battle's signs, no raccoon's eyes, no rhinorrhea. No hemotympanum. No tenderness to palpation of the face or skull. No deformity, crepitus, or swelling noted.  Eyes:     General:        Right eye: No discharge.        Left eye: No discharge.     Extraocular Movements: Extraocular movements intact.     Conjunctiva/sclera: Conjunctivae normal.     Pupils: Pupils are equal, round, and reactive to light.  Neck:     Vascular: No JVD.     Trachea: No tracheal deviation.  Cardiovascular:     Rate and Rhythm: Normal rate and regular rhythm.     Comments: 2+ radial and left DP/PT pulses bilaterally.  Right BKA. Pulmonary:     Effort: Pulmonary effort is normal.  Abdominal:     General: Bowel sounds are normal. There is no distension.     Palpations: Abdomen is soft.     Tenderness: There is no abdominal tenderness. There is no guarding or rebound.  Musculoskeletal:      Comments: No midline CSP, TSB, or LSP tenderness.  No deformity, crepitus, or step-off noted.  There is right posterior thoracic tenderness at around the levels of T5-T7.  No deformity or crepitus noted.  Somewhat difficult to examine due to body habitus and pain.  Moves extremity spontaneously exhibiting good muscle tone.  Skin:    General: Skin is warm and dry.     Findings: No erythema.  Neurological:     Mental Status: He is alert.     Comments: Fluent speech with no evidence of dysarthria or aphasia.  No facial droop.  Sensation intact to soft touch of face and extremities.  No pronator drift.  4+/5 strength of bilateral hip flexors.  Otherwise 5/5 strength of BUE and BLE major muscle groups.  Psychiatric:        Behavior: Behavior normal.      ED Treatments / Results  Labs (all labs ordered are listed, but only abnormal results are displayed) Labs Reviewed  COMPREHENSIVE METABOLIC PANEL - Abnormal; Notable for the following components:      Result Value   Sodium 133 (*)    Potassium 3.1 (*)    Chloride 97 (*)    Glucose, Bld 156 (*)    Creatinine, Ser 1.28 (*)    Calcium 8.6 (*)    Total Protein 6.3 (*)    Albumin 3.4 (*)    GFR calc non Af Amer 58 (*)    All other components within normal limits  CBC WITH DIFFERENTIAL/PLATELET - Abnormal; Notable for the following components:   RBC 4.00 (*)    Hemoglobin 10.6 (*)    HCT 33.3 (*)    Lymphs Abs 0.4 (*)    All other components within normal limits  URINALYSIS, ROUTINE W REFLEX MICROSCOPIC - Abnormal; Notable for the following components:   Color, Urine AMBER (*)    APPearance HAZY (*)    Hgb urine dipstick SMALL (*)    Leukocytes,Ua SMALL (*)    Bacteria, UA FEW (*)    Non Squamous Epithelial 0-5 (*)    All other components within normal limits  I-STAT TROPONIN, ED - Abnormal; Notable for the following components:   Troponin i, poc 0.10 (*)    All other components within normal limits  CULTURE, BLOOD (ROUTINE X 2)    CULTURE, BLOOD (ROUTINE X 2)  URINE CULTURE  LACTIC ACID, PLASMA  INFLUENZA PANEL BY  PCR (TYPE A & B)  LACTIC ACID, PLASMA  I-STAT TROPONIN, ED    EKG EKG Interpretation  Date/Time:  Thursday August 15 2018 16:33:16 EDT Ventricular Rate:  78 PR Interval:    QRS Duration: 127 QT Interval:  372 QTC Calculation: 424 R Axis:   6 Text Interpretation:  Sinus rhythm Atrial premature complex Probable left atrial enlargement Nonspecific intraventricular conduction delay since last tracing no significant change Confirmed by Daleen Bo [phone]) on 08/15/2018 4:59:32 PM   Radiology Ct Head Wo Contrast  Result Date: 08/15/2018 CLINICAL DATA:  Recent fall with headaches, initial encounter EXAM: CT HEAD WITHOUT CONTRAST TECHNIQUE: Contiguous axial images were obtained from the base of the skull through the vertex without intravenous contrast. COMPARISON:  04/23/2014 FINDINGS: Brain: No evidence of acute infarction, hemorrhage, hydrocephalus, extra-axial collection or mass lesion/mass effect. Vascular: No hyperdense vessel or unexpected calcification. Skull: Normal. Negative for fracture or focal lesion. Sinuses/Orbits: Paranasal sinuses demonstrates mucosal thickening as well as prior surgical changes in the maxillary antra bilaterally. Other: None. IMPRESSION: No acute intracranial abnormality noted. Mild sinus disease. Electronically Signed   By: Inez Catalina M.D.   On: 08/15/2018 18:15   Dg Chest Port 1 View  Result Date: 08/15/2018 CLINICAL DATA:  Chest pain and fever for 2 days EXAM: PORTABLE CHEST 1 VIEW COMPARISON:  01/14/2018 FINDINGS: Cardiac shadow is stable. The lungs are well aerated bilaterally. No focal infiltrate or sizable effusion is seen. No acute bony abnormality is noted. IMPRESSION: No active disease. Electronically Signed   By: Inez Catalina M.D.   On: 08/15/2018 17:13    Procedures Procedures (including critical care time)  Medications Ordered in ED Medications  sodium  chloride 0.9 % bolus 500 mL (0 mLs Intravenous Stopped 08/15/18 1925)  acetaminophen (TYLENOL) tablet 1,000 mg (1,000 mg Oral Given 08/15/18 1832)  fentaNYL (SUBLIMAZE) injection 50 mcg (50 mcg Intravenous Given 08/15/18 1920)  sodium chloride 0.9 % bolus 500 mL (0 mLs Intravenous Stopped 08/15/18 2015)  cefTRIAXone (ROCEPHIN) 1 g in sodium chloride 0.9 % 100 mL IVPB (0 g Intravenous Stopped 08/15/18 1958)  sodium chloride 0.9 % bolus 500 mL (0 mLs Intravenous Stopped 08/15/18 2056)     Initial Impression / Assessment and Plan / ED Course  I have reviewed the triage vital signs and the nursing notes.  Pertinent labs & imaging results that were available during my care of the patient were reviewed by me and considered in my medical decision making (see chart for details).        Patient with extensive medical history presenting for evaluation of fever, cough, hematuria, dysuria, chest pain, and fall.  Rectal temp of 101.4 F in the ED with improvement with p.o. acetaminophen.  Initially hypotensive on arrival after administration of sublingual nitroglycerin for chest pain with EMS with some improvement, though blood pressures have remained soft while in the ED despite fluid rehydration.  He does not have a leukocytosis and initial lactate was negative so code sepsis was not initiated however we did obtain blood cultures given chest x-ray shows no sign of pneumonia and UA is equivocal for UTI.  Due to his history of CHF with decreased left ventricular EF, he was given IV fluid boluses cautiously so as to avoid pulmonary edema.  Repeat lactate within normal limits.  He does not appear to be septic at this time.  With regards to his fall, he reports that his legs "gave out "while ambulating at around 1 AM today.  Unclear  if there was any prodrome leading up to the fall, whether his fall could be secondary to his chronic back pain versus hypotension.  His head CT shows no evidence of acute intracranial  abnormalities with no evidence of skull fracture, ICH, SAH, or CVA.  No evidence of acute osseous abnormality on the chest x-ray.  Neurovascularly intact otherwise with no focal neurologic deficits.  Initial i-STAT troponin mildly elevated.  His EKG shows no acute ischemic changes and repeat troponin shows no worsening.  He has a history of extensive cardiac disease but I doubt acute MI.  His troponin could be elevated for a number of reasons including infection or ischemia secondary to hypotension.  Doubt aortic dissection.  Given fever and complaints of cough and urinary symptoms he was given IV Rocephin.  We will culture his urine. With negative influenza test, consider testing for COVID-19 due to fever and cough.   8:59 PM Signed out care to Dr. Eulis Foster. Plan for admission for workup of fever and hypotension. Patient appears stable at this time.     Travis JILES Sr. was evaluated in Emergency Department on 08/15/2018 for the symptoms described in the history of present illness. He was evaluated in the context of the global COVID-19 pandemic, which necessitated consideration that the patient might be at risk for infection with the SARS-CoV-2 virus that causes COVID-19. Institutional protocols and algorithms that pertain to the evaluation of patients at risk for COVID-19 are in a state of rapid change based on information released by regulatory bodies including the CDC and federal and state organizations. These policies and algorithms were followed during the patient's care in the ED.     Final Clinical Impressions(s) / ED Diagnoses   Final diagnoses:  Fever in adult  Hypotension, unspecified hypotension type    ED Discharge Orders    None       Debroah Baller 08/15/18 2059    Daleen Bo, MD 08/15/18 2223

## 2018-08-15 NOTE — ED Triage Notes (Signed)
Pt from home with ems for fall that occurred around 1am along with central chest pain, chronic back pain and fever, pt states he had a fever of 104.2 yesterday. Denies any cough. P incontinent of urine . 324 asa and 1 nitro given en route. Pt a.o

## 2018-08-15 NOTE — Telephone Encounter (Signed)
Message from Berneta Levins sent at 08/15/2018 3:09 PM EDT   Summary: 52 Fever/bleeding   Male caller called and left voicemail on [place] on 08/14/2018 - states that pt is running a fever of 104 and is bleeding from private parts. States to call back at [phone]

## 2018-08-15 NOTE — Telephone Encounter (Signed)
Sleep lab technician called to advise the patient that we would have to send auto CPAP orders there was no answer. LVM  For the patient to call back.

## 2018-08-15 NOTE — ED Notes (Signed)
Portable xray at bedside.

## 2018-08-16 ENCOUNTER — Inpatient Hospital Stay (HOSPITAL_COMMUNITY): Payer: Medicare HMO

## 2018-08-16 ENCOUNTER — Observation Stay (HOSPITAL_COMMUNITY): Payer: Medicare HMO

## 2018-08-16 ENCOUNTER — Encounter (HOSPITAL_COMMUNITY): Payer: Self-pay

## 2018-08-16 DIAGNOSIS — E785 Hyperlipidemia, unspecified: Secondary | ICD-10-CM | POA: Diagnosis present

## 2018-08-16 DIAGNOSIS — G4733 Obstructive sleep apnea (adult) (pediatric): Secondary | ICD-10-CM | POA: Diagnosis present

## 2018-08-16 DIAGNOSIS — R7989 Other specified abnormal findings of blood chemistry: Secondary | ICD-10-CM

## 2018-08-16 DIAGNOSIS — I959 Hypotension, unspecified: Secondary | ICD-10-CM | POA: Diagnosis present

## 2018-08-16 DIAGNOSIS — E1151 Type 2 diabetes mellitus with diabetic peripheral angiopathy without gangrene: Secondary | ICD-10-CM | POA: Diagnosis present

## 2018-08-16 DIAGNOSIS — Z9861 Coronary angioplasty status: Secondary | ICD-10-CM | POA: Diagnosis not present

## 2018-08-16 DIAGNOSIS — D72810 Lymphocytopenia: Secondary | ICD-10-CM | POA: Diagnosis present

## 2018-08-16 DIAGNOSIS — E876 Hypokalemia: Secondary | ICD-10-CM

## 2018-08-16 DIAGNOSIS — N3001 Acute cystitis with hematuria: Secondary | ICD-10-CM | POA: Diagnosis present

## 2018-08-16 DIAGNOSIS — Y92009 Unspecified place in unspecified non-institutional (private) residence as the place of occurrence of the external cause: Secondary | ICD-10-CM | POA: Diagnosis not present

## 2018-08-16 DIAGNOSIS — F319 Bipolar disorder, unspecified: Secondary | ICD-10-CM | POA: Diagnosis present

## 2018-08-16 DIAGNOSIS — M546 Pain in thoracic spine: Secondary | ICD-10-CM | POA: Diagnosis not present

## 2018-08-16 DIAGNOSIS — E860 Dehydration: Secondary | ICD-10-CM | POA: Diagnosis present

## 2018-08-16 DIAGNOSIS — I2510 Atherosclerotic heart disease of native coronary artery without angina pectoris: Secondary | ICD-10-CM | POA: Diagnosis present

## 2018-08-16 DIAGNOSIS — D649 Anemia, unspecified: Secondary | ICD-10-CM | POA: Diagnosis not present

## 2018-08-16 DIAGNOSIS — D638 Anemia in other chronic diseases classified elsewhere: Secondary | ICD-10-CM | POA: Diagnosis present

## 2018-08-16 DIAGNOSIS — G894 Chronic pain syndrome: Secondary | ICD-10-CM | POA: Diagnosis present

## 2018-08-16 DIAGNOSIS — I252 Old myocardial infarction: Secondary | ICD-10-CM | POA: Diagnosis not present

## 2018-08-16 DIAGNOSIS — N529 Male erectile dysfunction, unspecified: Secondary | ICD-10-CM | POA: Diagnosis present

## 2018-08-16 DIAGNOSIS — R079 Chest pain, unspecified: Secondary | ICD-10-CM

## 2018-08-16 DIAGNOSIS — R0789 Other chest pain: Secondary | ICD-10-CM | POA: Diagnosis not present

## 2018-08-16 DIAGNOSIS — R509 Fever, unspecified: Secondary | ICD-10-CM | POA: Diagnosis present

## 2018-08-16 DIAGNOSIS — M19071 Primary osteoarthritis, right ankle and foot: Secondary | ICD-10-CM | POA: Diagnosis present

## 2018-08-16 DIAGNOSIS — I5032 Chronic diastolic (congestive) heart failure: Secondary | ICD-10-CM | POA: Diagnosis present

## 2018-08-16 DIAGNOSIS — I110 Hypertensive heart disease with heart failure: Secondary | ICD-10-CM | POA: Diagnosis present

## 2018-08-16 DIAGNOSIS — Z89511 Acquired absence of right leg below knee: Secondary | ICD-10-CM | POA: Diagnosis not present

## 2018-08-16 DIAGNOSIS — M797 Fibromyalgia: Secondary | ICD-10-CM | POA: Diagnosis present

## 2018-08-16 DIAGNOSIS — E871 Hypo-osmolality and hyponatremia: Secondary | ICD-10-CM | POA: Diagnosis present

## 2018-08-16 DIAGNOSIS — F418 Other specified anxiety disorders: Secondary | ICD-10-CM | POA: Diagnosis present

## 2018-08-16 DIAGNOSIS — I248 Other forms of acute ischemic heart disease: Secondary | ICD-10-CM | POA: Diagnosis present

## 2018-08-16 DIAGNOSIS — W19XXXA Unspecified fall, initial encounter: Secondary | ICD-10-CM | POA: Diagnosis present

## 2018-08-16 LAB — RESPIRATORY PANEL BY PCR
Adenovirus: NOT DETECTED
Bordetella pertussis: NOT DETECTED
Chlamydophila pneumoniae: NOT DETECTED
Coronavirus 229E: NOT DETECTED
Coronavirus HKU1: NOT DETECTED
Coronavirus NL63: NOT DETECTED
Coronavirus OC43: NOT DETECTED
Influenza A: NOT DETECTED
Influenza B: NOT DETECTED
Metapneumovirus: NOT DETECTED
Mycoplasma pneumoniae: NOT DETECTED
PARAINFLUENZA VIRUS 1-RVPPCR: NOT DETECTED
Parainfluenza Virus 2: NOT DETECTED
Parainfluenza Virus 3: NOT DETECTED
Parainfluenza Virus 4: NOT DETECTED
RHINOVIRUS / ENTEROVIRUS - RVPPCR: NOT DETECTED
Respiratory Syncytial Virus: NOT DETECTED

## 2018-08-16 LAB — CBC
HEMATOCRIT: 30.6 % — AB (ref 39.0–52.0)
Hemoglobin: 10 g/dL — ABNORMAL LOW (ref 13.0–17.0)
MCH: 27.2 pg (ref 26.0–34.0)
MCHC: 32.7 g/dL (ref 30.0–36.0)
MCV: 83.4 fL (ref 80.0–100.0)
Platelets: 153 10*3/uL (ref 150–400)
RBC: 3.67 MIL/uL — ABNORMAL LOW (ref 4.22–5.81)
RDW: 15.2 % (ref 11.5–15.5)
WBC: 4 10*3/uL (ref 4.0–10.5)
nRBC: 0 % (ref 0.0–0.2)

## 2018-08-16 LAB — BASIC METABOLIC PANEL
Anion gap: 12 (ref 5–15)
BUN: 17 mg/dL (ref 8–23)
CO2: 22 mmol/L (ref 22–32)
Calcium: 8.1 mg/dL — ABNORMAL LOW (ref 8.9–10.3)
Chloride: 100 mmol/L (ref 98–111)
Creatinine, Ser: 1.02 mg/dL (ref 0.61–1.24)
GFR calc Af Amer: >60 mL/min (ref >60)
GFR calc non Af Amer: >60 mL/min (ref >60)
GLUCOSE: 147 mg/dL — AB (ref 70–99)
Potassium: 3.4 mmol/L — ABNORMAL LOW (ref 3.5–5.1)
Sodium: 134 mmol/L — ABNORMAL LOW (ref 135–145)

## 2018-08-16 LAB — TROPONIN I
TROPONIN I: 0.05 ng/mL — AB (ref <0.03)
Troponin I: 0.07 ng/mL (ref <0.03)
Troponin I: 0.09 ng/mL (ref <0.03)
Troponin I: 0.06 ng/mL (ref <0.03)

## 2018-08-16 LAB — CK: Total CK: 615 U/L — ABNORMAL HIGH (ref 49–397)

## 2018-08-16 LAB — GLUCOSE, CAPILLARY
Glucose-Capillary: 150 mg/dL — ABNORMAL HIGH (ref 70–99)
Glucose-Capillary: 135 mg/dL — ABNORMAL HIGH (ref 70–99)
Glucose-Capillary: 171 mg/dL — ABNORMAL HIGH (ref 70–99)
Glucose-Capillary: 148 mg/dL — ABNORMAL HIGH (ref 70–99)
Glucose-Capillary: 140 mg/dL — ABNORMAL HIGH (ref 70–99)

## 2018-08-16 LAB — URINE CULTURE: Culture: <10000 — AB

## 2018-08-16 LAB — D-DIMER, QUANTITATIVE: D-Dimer, Quant: 1.77 ug/mL-FEU — ABNORMAL HIGH (ref 0.00–0.50)

## 2018-08-16 LAB — MAGNESIUM: Magnesium: 1.5 mg/dL — ABNORMAL LOW (ref 1.7–2.4)

## 2018-08-16 LAB — PROCALCITONIN: Procalcitonin: 45.38 ng/mL

## 2018-08-16 LAB — C-REACTIVE PROTEIN: CRP: 14.1 mg/dL — ABNORMAL HIGH (ref <1.0)

## 2018-08-16 LAB — SEDIMENTATION RATE: Sed Rate: 35 mm/hr — ABNORMAL HIGH (ref 0–16)

## 2018-08-16 MED ORDER — NITROGLYCERIN 0.4 MG SL SUBL
0.4000 mg | SUBLINGUAL_TABLET | SUBLINGUAL | Status: DC | PRN
  Administered 2018-08-16: 0.4 mg via SUBLINGUAL
  Filled 2018-08-16: qty 1

## 2018-08-16 MED ORDER — CLONAZEPAM 0.25 MG PO TBDP
0.5000 mg | ORAL_TABLET | Freq: Two times a day (BID) | ORAL | Status: DC
  Administered 2018-08-16: 0.5 mg via ORAL
  Filled 2018-08-16 (×2): qty 2

## 2018-08-16 MED ORDER — TRAZODONE HCL 50 MG PO TABS
150.0000 mg | ORAL_TABLET | Freq: Once | ORAL | Status: AC
  Administered 2018-08-16: 150 mg via ORAL
  Filled 2018-08-16: qty 1

## 2018-08-16 MED ORDER — IOHEXOL 350 MG/ML SOLN
100.0000 mL | Freq: Once | INTRAVENOUS | Status: AC | PRN
  Administered 2018-08-16: 100 mL via INTRAVENOUS

## 2018-08-16 MED ORDER — OXYCODONE HCL 5 MG PO TABS
5.0000 mg | ORAL_TABLET | Freq: Four times a day (QID) | ORAL | Status: DC | PRN
  Administered 2018-08-16: 5 mg via ORAL
  Filled 2018-08-16: qty 1

## 2018-08-16 MED ORDER — SODIUM CHLORIDE 0.9 % IV SOLN
INTRAVENOUS | Status: DC
  Administered 2018-08-16: 10:00:00 via INTRAVENOUS

## 2018-08-16 MED ORDER — SODIUM CHLORIDE 0.9 % IV SOLN
1.0000 g | INTRAVENOUS | Status: DC
  Administered 2018-08-16: 1 g via INTRAVENOUS
  Filled 2018-08-16 (×2): qty 10

## 2018-08-16 MED ORDER — CLONAZEPAM 0.5 MG PO TBDP: 0.5000 mg | ORAL_TABLET | Freq: Once | ORAL | Status: DC

## 2018-08-16 MED ORDER — INSULIN ASPART 100 UNIT/ML ~~LOC~~ SOLN
0.0000 [IU] | Freq: Three times a day (TID) | SUBCUTANEOUS | Status: DC
  Administered 2018-08-16: 1 [IU] via SUBCUTANEOUS

## 2018-08-16 MED ORDER — POTASSIUM CHLORIDE CRYS ER 20 MEQ PO TBCR
20.0000 meq | EXTENDED_RELEASE_TABLET | Freq: Once | ORAL | Status: AC
  Administered 2018-08-16: 20 meq via ORAL
  Filled 2018-08-16: qty 1

## 2018-08-16 MED ORDER — MAGNESIUM SULFATE 2 GM/50ML IV SOLN
2.0000 g | Freq: Once | INTRAVENOUS | Status: AC
  Administered 2018-08-16: 2 g via INTRAVENOUS
  Filled 2018-08-16: qty 50

## 2018-08-16 MED ORDER — GADOBUTROL 1 MMOL/ML IV SOLN
10.0000 mL | Freq: Once | INTRAVENOUS | Status: AC | PRN
  Administered 2018-08-16: 10 mL via INTRAVENOUS

## 2018-08-16 MED ORDER — POTASSIUM CHLORIDE CRYS ER 20 MEQ PO TBCR: 20.0000 meq | EXTENDED_RELEASE_TABLET | Freq: Once | ORAL | Status: DC

## 2018-08-16 MED ORDER — TRAZODONE HCL 150 MG PO TABS
150.0000 mg | ORAL_TABLET | Freq: Once | ORAL | Status: AC
  Administered 2018-08-16: 150 mg via ORAL
  Filled 2018-08-16: qty 1

## 2018-08-16 NOTE — Progress Notes (Addendum)
PROGRESS NOTE    Travis [name]  CWC:[id number] DOB: 01-09-1951 DOA: 08/15/2018 PCP: [name], MD      Brief Narrative:  Travis [name] is a 68 y.o. 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  Patient was in usual state of health until two days PTA Tuesday, began having fever, dysuria, hematuria.  Overnight Tuesday night he collapsed because his "legs gave out".  On Thursday, he called his HHPT agency who recommended he call 9-1-1 (unclear if this was due to fall, chest pain, or fever).  EMS initially presented, found his vital signs stable and recommended no transfer in setting of pandemic.  Patient then with chest pain, called EMS again and was transported.  In ER, CXR clear.  No hypoxia, or tachypnea.  He was given nitro x1 by EMS and was hypotensive on arrival.  No leukocytosis, but fever to 101F.  Started on broad spectrum empiric antibiotics and admitted for fever of unknown source.       Assessment & Plan:  Fever Sepsis unlikely Suspect acute cystitis Lactate and HR/RR normal on admission, WBC normal.  BP low, but consistent with nitro admin.  Given 1.5L in ER and BP normalized.  Procalcitonin very high.  Symptoms clearly localize to bladder.  CXR clear.  CT head and MR spine negative for foci of infection.  Self-catherizes bladder twice per month.  Discussed with ID, he has no epidemiologic risk factors for SARS-CoV-2 (is homebound, no family travel or illness), he clearly does not have COVID clinically, so CoV testing not warranted, exceedingly low likelihood of disease.  -Stop vancomycin and cefepime -Continue ceftriaxone for UTI, community acquired -Follow blood and urine culture    Elevated troponin Chest pain History of CV disease Chest pain started day of admission, spontaneously.  Waxes and  wanes, without provoking factors.  Not exertional.  Improved after nitro in ER, returned overnight, resolved with Norco.  Follows with Dr. Saunders Revel.  On Effient, aspirin, statin at baseline.  Last PCI >10 years ago, stress test last fall was low risk.    Has had troponin leak in past (May 2016) when he was admitted with anemia and syncope from anemia.  Trop 0.07 >> 0.09.  ECG unremarkable, no active chest pain at rest.  Will defer heparin and discuss ischemic testing vs rule out of PE (I calc wells score 1.5) with Cardiology. -Trend troponin to peak -Consult Cardiology  ADDENDUM:  Discussed with Dr. Tamala Julian, he and team have reviewed chart, labs, ECGs.  They agree with my assessment that his reported chest pains do not sound anginal in nature, doubt unstable ischemic heart disease requiring urgent cath. They recommend: -Trend troponin -Trend ECG for changes -If he were to develop classic exertional symptoms, a change in troponin, or new ECG changes, Cardiology should be reconsulted. Otherwise, they recommend no further ischemic work up or echo  In light of this, I will obtain a d-dimer.  If elevated, will proceed with CTA chest to rule out PE in this patient with sedentary lifestyle, recent back surgery.   Addendum:  CTA rules out PE.  However, radiology feel that soft tissue malignancy (I.e. neurogenic tumor, metastasis or lymphoma) is not ruled out, they recommend oncologic work up. I discussed with Oncology, they note that this is a difficult area to biopsy.  They will review  and make recommendations about whehter this should be an inpatient or outpatient workup.  Addendum 3:  After review of imaging, Oncology on call agree that suspicion of malignancy is low. His recent PSA was normal. Last colonoscopy in 10/2014. It is unclear what the soft tissue around the right 4th rib is, but no obvious bone lesion on scans.  -Oncology recmomend f/u with his PCP Dr. Jenny Reichmann, and may consider a repeated CT scan  in a few months.      Back pain, possible muscle strain Pain present last 2 months, slowly worsening.  No other associated pains.  No joint pains, rashes.  No injury.  No swelling.  Pain worse with movement or deep breath.  MRI shows nonspecific edema.  I actually suspect this is a strain.  There are NO MRI findings of infection nearby, so think infectious myositis is ruled out.  Too focal for rhabdo. Autoimmune or drug related myositis are possible, but again, seem unlikely to be so focal. -Follow up with PCP  Hypokalemia Hypomagnesemia Mag given overnight -Supplement K  Hyponatremia Mild, asymptomatic  Anemia of chronic disease Stable relative to baseline ~10 g/dL  Chronic pain -Continue Norco -Continue trazodone -Restart Lyrica, gabapentin, when med rec complete  Bipolar disorder No active disease -Resume Cogentin, Abilify, Cymbalta hen med rec complete  OSA  History of diabetes Apparently this resolved after his right BKA.   A1c 5.2% in Jan this year.  Chronic diastolic CHF Appears euvolemic to dehydrated.  Not on baseline diuretics.         MDM and disposition: The below labs and imaging reports were reviewed and summarized above.  Medication management as above.  The patient was admitted with fever, fall, dysuria and hematuria.  He incidentally complained of chest pain, and was noted to have mildly elevated troponin  His overall symptomatology to me represents cystitis, without sepsis.  I cannot account for his troponin, which is rising slightly this morning.    In light of this evidence of cardiac muscle damage, elevated cardiac enzymes, and suspected infection, continued inpatient services are reasonable and expected and premature discharge has excessive risk of adverse outcome, including readmission, debility or death       DVT prophylaxis: Lovenox Code Status: FULL Family Communication: None present    Consultants:   Cardiology   Procedures:   MRI T spine with and without -- mild edema of right paraspinal area, nonspecific  Antimicrobials:   Vancomycin 3/26 >> 3/27  Cefepime 3/26 >> 3/27  Ceftriaxone 3/26 >>        Subjective: Had a brief non-exertional chest pain this morning.  Resolved spontaneously.  The back pain in right upper shoulder is still present.  No dyspnea, no cough anymore.  No chest pain at present.  No palpitations.  Still hematuria.  No abdominal pain or flank pain.  No leg weakess or numbness.  No headache, body aches, fatigue.       Objective: Vitals:   08/15/18 2304 08/16/18 0325 08/16/18 0500 08/16/18 0906  BP: (!) 112/52   139/70  Pulse: 74   71  Resp:      Temp: 98.9 F (37.2 C) (!) 103.1 F (39.5 C) 100.3 F (37.9 C) 98.2 F (36.8 C)  TempSrc: Oral Oral Oral Oral  SpO2: 99%   97%  Weight: (!) 146.8 kg     Height: 6' 1"  (1.854 m)       Intake/Output Summary (Last 24 hours) at 08/16/2018 1032 Last data filed  at 08/16/2018 0600 Gross per 24 hour  Intake 2225.59 ml  Output 950 ml  Net 1275.59 ml   Filed Weights   08/15/18 1638 08/15/18 2304  Weight: (!) 148.3 kg (!) 146.8 kg    Examination: General appearance: obese adult male, alert and in no acute distress.   HEENT: Anicteric, conjunctiva pink, lids and lashes normal. No nasal deformity, discharge, epistaxis.  Lips moist, dentition normal.   Skin: Warm and dry.  no jaundice.  No suspicious rashes or lesions. Cardiac: RRR, nl S1-S2, no murmurs appreciated.  Capillary refill is brisk.  JVP not visible.  No LE edema.  Radia  pulses 2+ and symmetric. Respiratory: Normal respiratory rate and rhythm.  CTAB without rales or wheezes. Abdomen: Abdomen soft.  no TTP. No ascites, distension, hepatosplenomegaly.   MSK: No deformities or effusions.  Right BKA.   Neuro: Awake and alert.  EOMI, moves all extremities. Speech fluent.    Psych: Sensorium intact and responding to questions, attention normal. Affect normal.   Judgment and insight appear normal.    Data Reviewed: I have personally reviewed following labs and imaging studies:  CBC: Recent Labs  Lab 08/15/18 1653 08/16/18 0633  WBC 6.5 4.0  NEUTROABS 5.7  --   HGB 10.6* 10.0*  HCT 33.3* 30.6*  MCV 83.3 83.4  PLT 168 009   Basic Metabolic Panel: Recent Labs  Lab 08/15/18 1653 08/15/18 2308 08/16/18 0633  NA 133*  --  134*  K 3.1*  --  3.4*  CL 97*  --  100  CO2 26  --  22  GLUCOSE 156*  --  147*  BUN 21  --  17  CREATININE 1.28*  --  1.02  CALCIUM 8.6*  --  8.1*  MG  --  1.5*  --    GFR: Estimated Creatinine Clearance: 107.5 mL/min (by C-G formula based on SCr of 1.02 mg/dL). Liver Function Tests: Recent Labs  Lab 08/15/18 1653  AST 34  ALT 19  ALKPHOS 60  BILITOT 1.0  PROT 6.3*  ALBUMIN 3.4*   No results for input(s): LIPASE, AMYLASE in the last 168 hours. No results for input(s): AMMONIA in the last 168 hours. Coagulation Profile: No results for input(s): INR, PROTIME in the last 168 hours. Cardiac Enzymes: Recent Labs  Lab 08/15/18 2308 08/16/18 0633  CKTOTAL 615*  --   TROPONINI 0.07* 0.09*   BNP (last 3 results) No results for input(s): PROBNP in the last 8760 hours. HbA1C: No results for input(s): HGBA1C in the last 72 hours. CBG: Recent Labs  Lab 08/15/18 2311 08/16/18 0903  GLUCAP 150* 135*   Lipid Profile: No results for input(s): CHOL, HDL, LDLCALC, TRIG, CHOLHDL, LDLDIRECT in the last 72 hours. Thyroid Function Tests: No results for input(s): TSH, T4TOTAL, FREET4, T3FREE, THYROIDAB in the last 72 hours. Anemia Panel: No results for input(s): VITAMINB12, FOLATE, FERRITIN, TIBC, IRON, RETICCTPCT in the last 72 hours. Urine analysis:    Component Value Date/Time   COLORURINE AMBER (A) 08/15/2018 1800   APPEARANCEUR HAZY (A) 08/15/2018 1800   LABSPEC 1.024 08/15/2018 1800   PHURINE 5.0 08/15/2018 1800   GLUCOSEU NEGATIVE 08/15/2018 1800   GLUCOSEU NEGATIVE 06/03/2018 1333   HGBUR  SMALL (A) 08/15/2018 1800   HGBUR negative 04/19/2010 1014   BILIRUBINUR NEGATIVE 08/15/2018 1800   KETONESUR NEGATIVE 08/15/2018 1800   PROTEINUR NEGATIVE 08/15/2018 1800   UROBILINOGEN 0.2 06/03/2018 1333   NITRITE NEGATIVE 08/15/2018 1800   LEUKOCYTESUR SMALL (A) 08/15/2018 1800  Sepsis Labs: @LABRCNTIP (procalcitonin:4,lacticacidven:4)  ) Recent Results (from the past 240 hour(s))  Blood Culture (routine x 2)     Status: None (Preliminary result)   Collection Time: 08/15/18  5:14 PM  Result Value Ref Range Status   Specimen Description BLOOD RIGHT FOREARM  Final   Special Requests   Final    BOTTLES DRAWN AEROBIC AND ANAEROBIC Blood Culture results may not be optimal due to an excessive volume of blood received in culture bottles   Culture   Final    NO GROWTH < 24 HOURS Performed at [facility] [address] [place], [place] [address]    Report Status PENDING  Incomplete  Blood Culture (routine x 2)     Status: None (Preliminary result)   Collection Time: 08/15/18  5:14 PM  Result Value Ref Range Status   Specimen Description BLOOD LEFT FOREARM  Final   Special Requests   Final    BOTTLES DRAWN AEROBIC AND ANAEROBIC Blood Culture results may not be optimal due to an excessive volume of blood received in culture bottles   Culture   Final    NO GROWTH < 24 HOURS Performed at [facility], [place] [address] [place], [place] [address]    Report Status PENDING  Incomplete  Respiratory Panel by PCR     Status: None   Collection Time: 08/16/18 12:26 AM  Result Value Ref Range Status   Adenovirus NOT DETECTED NOT DETECTED Final   Coronavirus 229E NOT DETECTED NOT DETECTED Final    Comment: (NOTE) The Coronavirus on the Respiratory Panel, DOES NOT test for the novel  Coronavirus (2019 nCoV)    Coronavirus HKU1 NOT DETECTED NOT DETECTED Final   Coronavirus NL63 NOT DETECTED NOT DETECTED Final   Coronavirus OC43 NOT DETECTED NOT DETECTED Final   Metapneumovirus  NOT DETECTED NOT DETECTED Final   Rhinovirus / Enterovirus NOT DETECTED NOT DETECTED Final   Influenza A NOT DETECTED NOT DETECTED Final   Influenza B NOT DETECTED NOT DETECTED Final   Parainfluenza Virus 1 NOT DETECTED NOT DETECTED Final   Parainfluenza Virus 2 NOT DETECTED NOT DETECTED Final   Parainfluenza Virus 3 NOT DETECTED NOT DETECTED Final   Parainfluenza Virus 4 NOT DETECTED NOT DETECTED Final   Respiratory Syncytial Virus NOT DETECTED NOT DETECTED Final   Bordetella pertussis NOT DETECTED NOT DETECTED Final   Chlamydophila pneumoniae NOT DETECTED NOT DETECTED Final   Mycoplasma pneumoniae NOT DETECTED NOT DETECTED Final    Comment: Performed at [facility], [address], [place], [place] [address]         Radiology Studies: Ct Head Wo Contrast  Result Date: 08/15/2018 CLINICAL DATA:  Recent fall with headaches, initial encounter EXAM: CT HEAD WITHOUT CONTRAST TECHNIQUE: Contiguous axial images were obtained from the base of the skull through the vertex without intravenous contrast. COMPARISON:  04/23/2014 FINDINGS: Brain: No evidence of acute infarction, hemorrhage, hydrocephalus, extra-axial collection or mass lesion/mass effect. Vascular: No hyperdense vessel or unexpected calcification. Skull: Normal. Negative for fracture or focal lesion. Sinuses/Orbits: Paranasal sinuses demonstrates mucosal thickening as well as prior surgical changes in the maxillary antra bilaterally. Other: None. IMPRESSION: No acute intracranial abnormality noted. Mild sinus disease. Electronically Signed   By: Inez Catalina M.D.   On: 08/15/2018 18:15   Mr Thoracic Spine W Wo Contrast  Addendum Date: 08/16/2018   ADDENDUM REPORT: 08/16/2018 10:28 ADDENDUM: Upon further review, there is mild marrow edema in the medial right fourth  rib with a small amount of surrounding soft tissue edema extending into the previously described right superior transversospinalis and erector spinae muscle edema.  There is no osseous destruction or cortical irregularity of the rib. Findings are favored to reflect active degenerative inflammatory changes related to right fourth costovertebral and costotransverse joint osteoarthritis with possible superimposed muscle strain. Infection is considered much less likely. No abscess. Electronically Signed   By: Titus Dubin M.D.   On: 08/16/2018 10:28   Result Date: 08/16/2018 CLINICAL DATA:  Severe upper back pain for the past few weeks. Fever. EXAM: MRI THORACIC WITHOUT AND WITH CONTRAST TECHNIQUE: Multiplanar and multiecho pulse sequences of the thoracic spine were obtained without and with intravenous contrast. CONTRAST:  10 mL Gadavist intravenous contrast. COMPARISON:  CT thoracic myelogram dated January 16, 2018. FINDINGS: Alignment:  Physiologic. Vertebrae: No evidence of discitis. There is an enhancing Schmorl's node at T11-T12 with adjacent mild marrow endplate edema and enhancement. The endplate cortex is preserved. Additional chronic Schmorl's node at T5-T6 with surrounding chronic fatty marrow endplate changes. There is mild enhancement of the T4-T5 and T7-T8 intervertebral discs without surrounding endplate marrow changes. No fracture or suspicious bone lesion. Cord: Again seen is focal mass effect on the posterior cord at T7, asymmetric to the left. No abnormal cord signal. No intradural enhancement. Paraspinal and other soft tissues: Prominent edema and enhancement within the superior right transversospinalis and erector spinae muscles. No paraspinal inflammatory changes. Trace bilateral pleural effusions. Disc levels: T1-T2: Negative disc. Unchanged bilateral facet joint ankylosis. Unchanged mild bilateral neuroforaminal stenosis. No spinal canal stenosis. T2-T3: Negative disc. Mild bilateral facet arthropathy. Unchanged mild right neuroforaminal stenosis. No spinal canal or left neuroforaminal stenosis. T3-T4: Mild disc height loss without significant disc bulge  or herniation. Mild right facet arthropathy. Unchanged mild right neuroforaminal stenosis. No spinal canal or left neuroforaminal stenosis. T4-T5: Unchanged moderate to severe disc height loss without significant disc bulge or herniation. Moderate right facet arthropathy. No stenosis. T5-T6: New shallow broad-based posterior disc protrusion. Moderate right and mild left facet arthropathy. No stenosis. T6-T7: Negative disc.  Facet joint ankylosis.  No stenosis. T7-T8: Unchanged tiny left paracentral disc osteophyte complex. Facet joint ankylosis. No stenosis. T8-T9: Negative disc.  Mild left facet arthropathy.  No stenosis. T9-T10: Negative disc. Mild bilateral facet arthropathy. No stenosis. T10-T11: Negative. T11-T12: Mild disc bulging. Moderate bilateral facet arthropathy. No stenosis. IMPRESSION: 1. No evidence of osteomyelitis-discitis. No acute osseous abnormality. 2. Prominent edema within the superior right transversospinalis and erector spinae muscles is nonspecific but could reflect severe strain or myositis. 3. Unchanged focal mass effect on the posterior thoracic cord at T7. No definite underlying arachnoid cyst identified. This could reflect an arachnoid web. 4. Relatively unchanged multilevel thoracic spondylosis as described above. No high-grade stenosis. 5. Trace bilateral pleural effusions. Electronically Signed: By: Titus Dubin M.D. On: 08/16/2018 09:43   Dg Chest Port 1 View  Result Date: 08/15/2018 CLINICAL DATA:  Chest pain and fever for 2 days EXAM: PORTABLE CHEST 1 VIEW COMPARISON:  01/14/2018 FINDINGS: Cardiac shadow is stable. The lungs are well aerated bilaterally. No focal infiltrate or sizable effusion is seen. No acute bony abnormality is noted. IMPRESSION: No active disease. Electronically Signed   By: Inez Catalina M.D.   On: 08/15/2018 17:13        Scheduled Meds: . enoxaparin (LOVENOX) injection  40 mg Subcutaneous Q24H  . insulin aspart  0-9 Units Subcutaneous TID WC   . potassium chloride  20 mEq  Oral Once   Continuous Infusions: . sodium chloride 75 mL/hr at 08/16/18 1026  . cefTRIAXone (ROCEPHIN)  IV       LOS: 0 days    Time spent: 35 minutes    Edwin Dada, MD [facility] 08/16/2018, 10:32 AM     Please page through [place]:  [email or web address] Password [email or web address] If 7PM-7AM, please contact night-coverage

## 2018-08-16 NOTE — Progress Notes (Signed)
Called xray per Dr Rathore's request to inquire about pt's ordered stat xray. Call made to Dr Marlowe Sax that stated due to pt's risk of Covid-19, he could not be taken to xray.

## 2018-08-16 NOTE — Progress Notes (Signed)
Patient complained of chest pain, 5 out of 10.  EKG was done.  Results were sinus rhythm with 1st degree A-V block.  Night provider, Jeannette Corpus, was notified.

## 2018-08-16 NOTE — Progress Notes (Signed)
Initial Nutrition Assessment   RD working remotely  [place]:   Morbid obesity  INTERVENTION:    Continue Regular diet, pt declined supplements  NUTRITION DIAGNOSIS:   Increased nutrient needs related to acute illness as evidenced by estimated needs  GOAL:   Patient will meet greater than or equal to 90% of their needs  MONITOR:   PO intake, Supplement acceptance, Labs, Skin, Weight trends, I & O's  REASON FOR ASSESSMENT:   Malnutrition Screening Tool  ASSESSMENT:   68 y.o. Male with PMH of CAD status post PCI, chronic pain, OSA, type 2 DM, CHF; presented for evaluation of fever, fall, and chest pain.   RD spoke with pt over room phone. He is pleasant.  Reports his appetite is "not so good;" ate only his bagel at breakfast. PTA pt continued to consume 3 meals per day, however, in smaller portions. He was not drinking any oral nutrition supplements at home.  Pt declining for RD to order during hospitalization. He admits to unintended weight loss of ~ 12 lbs. Pt is unable to identify time frame; his UBW is 330 lbs.   Labs reviewed. Na 134 (L). K 3.4 (L). Medications reviewed. CBG's [id number].  NUTRITION - FOCUSED PHYSICAL EXAM:  Unable to assess at this time  Diet Order:   Diet Order            Diet regular Room service appropriate? Yes; Fluid consistency: Thin  Diet effective now             EDUCATION NEEDS:   Not appropriate for education at this time  Skin:  Skin Assessment: Reviewed RN Assessment  Last BM:  3/25   Intake/Output Summary (Last 24 hours) at 08/16/2018 1417 Last data filed at 08/16/2018 0600 Gross per 24 hour  Intake 2225.59 ml  Output 950 ml  Net 1275.59 ml   Height:   Ht Readings from Last 1 Encounters:  08/15/18 6' 1"  (1.854 m)   Weight:   Wt Readings from Last 1 Encounters:  08/15/18 (!) 146.8 kg   BMI:  44 kg/m2 >> (adjusted for BKA)  Estimated Nutritional Needs:   Kcal:  1900-2100  Protein:   95-110 gm  Fluid:  1.9-2.1 L  Arthur Holms, RD, LDN Pager #: [phone] After-Hours Pager #: [phone]

## 2018-08-16 NOTE — Progress Notes (Signed)
PT Cancellation Note  Patient Details Name: Travis HUTCHERSON Sr. MRN: [id number] DOB: 20-Nov-1950   Cancelled Treatment:    Reason Eval/Treat Not Completed: Medical issues which prohibited therapy per RN, patient with chest pain awaiting EKG. PT will hold and follow.   Reinaldo Berber, PT, DPT Acute Rehabilitation Services Pager: [phone] Office: [phone]     Reinaldo Berber 08/16/2018, 2:56 PM

## 2018-08-16 NOTE — Plan of Care (Signed)
  Problem: Education: Goal: Knowledge of General Education information will improve Description Including pain rating scale, medication(s)/side effects and non-pharmacologic comfort measures Outcome: Not Progressing   Problem: Health Behavior/Discharge Planning: Goal: Ability to manage health-related needs will improve Outcome: Not Progressing   Problem: Clinical Measurements: Goal: Respiratory complications will improve Outcome: Progressing   Problem: Activity: Goal: Risk for activity intolerance will decrease Outcome: Not Progressing   Problem: Nutrition: Goal: Adequate nutrition will be maintained Outcome: Progressing   Problem: Coping: Goal: Level of anxiety will decrease Outcome: Not Progressing

## 2018-08-16 NOTE — Progress Notes (Signed)
OT Cancellation Note  Patient Details Name: RENNE PLATTS Sr. MRN: [id number] DOB: July 27, 1950   Cancelled Treatment:    Reason Eval/Treat Not Completed: Patient not medically ready(chest pain and awaiting EKG) Medical issues which prohibited therapy per RN, patient with chest pain awaiting EKG. PT will hold and follow.  Ebony Hail Harold Hedge) Marsa Aris OTR/L Acute Rehabilitation Services Pager: [phone] Office: [phone]   Fredda Hammed 08/16/2018, 3:11 PM

## 2018-08-17 DIAGNOSIS — R0789 Other chest pain: Secondary | ICD-10-CM

## 2018-08-17 DIAGNOSIS — W19XXXA Unspecified fall, initial encounter: Secondary | ICD-10-CM

## 2018-08-17 DIAGNOSIS — Y92009 Unspecified place in unspecified non-institutional (private) residence as the place of occurrence of the external cause: Secondary | ICD-10-CM

## 2018-08-17 LAB — GLUCOSE, CAPILLARY
GLUCOSE-CAPILLARY: 119 mg/dL — AB (ref 70–99)
Glucose-Capillary: 135 mg/dL — ABNORMAL HIGH (ref 70–99)

## 2018-08-17 LAB — CBC
HEMATOCRIT: 32.3 % — AB (ref 39.0–52.0)
Hemoglobin: 10.2 g/dL — ABNORMAL LOW (ref 13.0–17.0)
MCH: 26.1 pg (ref 26.0–34.0)
MCHC: 31.6 g/dL (ref 30.0–36.0)
MCV: 82.6 fL (ref 80.0–100.0)
Platelets: 166 10*3/uL (ref 150–400)
RBC: 3.91 MIL/uL — ABNORMAL LOW (ref 4.22–5.81)
RDW: 14.9 % (ref 11.5–15.5)
WBC: 3.5 10*3/uL — ABNORMAL LOW (ref 4.0–10.5)
nRBC: 0 % (ref 0.0–0.2)

## 2018-08-17 LAB — BASIC METABOLIC PANEL
Anion gap: 10 (ref 5–15)
BUN: 11 mg/dL (ref 8–23)
CHLORIDE: 104 mmol/L (ref 98–111)
CO2: 22 mmol/L (ref 22–32)
CREATININE: 0.84 mg/dL (ref 0.61–1.24)
Calcium: 8.5 mg/dL — ABNORMAL LOW (ref 8.9–10.3)
GFR calc Af Amer: >60 mL/min (ref >60)
GFR calc non Af Amer: >60 mL/min (ref >60)
Glucose, Bld: 157 mg/dL — ABNORMAL HIGH (ref 70–99)
Potassium: 3.8 mmol/L (ref 3.5–5.1)
Sodium: 136 mmol/L (ref 135–145)

## 2018-08-17 MED ORDER — CEPHALEXIN 500 MG PO CAPS
500.0000 mg | ORAL_CAPSULE | Freq: Two times a day (BID) | ORAL | Status: DC
  Administered 2018-08-17: 500 mg via ORAL
  Filled 2018-08-17: qty 1

## 2018-08-17 MED ORDER — CYCLOBENZAPRINE HCL 5 MG PO TABS: 5.0000 mg | ORAL_TABLET | Freq: Three times a day (TID) | ORAL | 0 refills | Status: DC | PRN

## 2018-08-17 MED ORDER — CEPHALEXIN 500 MG PO CAPS: 500.0000 mg | ORAL_CAPSULE | Freq: Two times a day (BID) | ORAL | 0 refills | Status: AC

## 2018-08-17 NOTE — Evaluation (Addendum)
Occupational Therapy Evaluation Patient Details Name: Travis [name] Sr. MRN: [id number] DOB: [birth year] Today's Date: 08/17/2018    History of Present Illness Pt is a 68 yo male s/p fall, fever and chest pain with elevated troponins. ECG negative for cardiac events throughout testing. Pt Pmhx: chronic pain syndrome. HTN, CAD, R BKA, back surgery , bipolar d/o.    Clinical Impression   Pt PTA: living in 1 level home with niece and her family. Pt depending on back pain can perform ADLs with no assist versus minA for LB ADL; mobility with RW independently. Pt currently supervision level for mobility due to back pain and use of RW for fair balance. Pt donning/doffing R prosthesis and pants with minA as pt's back pain increased after bed mobility. Pt use of rails for bed mobility promoted independence. UB ADL is independent. Pt at his functional baseline at this time and all other OT needs could be met through [facility] to resume at home. OT signing off.  Mild chest pain- reports back hurts worse. RN aware.      Follow Up Recommendations  Home health OT;Supervision - Intermittent    Equipment Recommendations  Hospital bed    Recommendations for Other Services       Precautions / Restrictions Precautions Precautions: Fall Restrictions Weight Bearing Restrictions: No      Mobility Bed Mobility Overal bed mobility: Modified Independent             General bed mobility comments: uses rails and elevation portion  Transfers Overall transfer level: Needs assistance Equipment used: Rolling walker (2 wheeled) Transfers: Sit to/from [employer] Transfers Sit to Stand: Supervision Stand pivot transfers: Supervision       General transfer comment: supervision for safety- no assist required with RW    Balance Overall balance assessment: Modified Independent                                         ADL either performed or assessed with clinical judgement    ADL Overall ADL's : At baseline                                       General ADL Comments: requires assist for LB ADL from family; UB modified independent     Vision Baseline Vision/History: Wears glasses Wears Glasses: At all times Vision Assessment?: No apparent visual deficits     Perception     Praxis      Pertinent Vitals/Pain Pain Assessment: No/denies pain     Hand Dominance Right   Extremity/Trunk Assessment Upper Extremity Assessment Upper Extremity Assessment: Overall WFL for tasks assessed   Lower Extremity Assessment Lower Extremity Assessment: Defer to PT evaluation;RLE deficits/detail RLE Deficits / Details: R BKA w/ prosthetic   Cervical / Trunk Assessment Cervical / Trunk Assessment: Normal   Communication Communication Communication: No difficulties   Cognition Arousal/Alertness: Awake/alert Behavior During Therapy: WFL for tasks assessed/performed Overall Cognitive Status: Within Functional Limits for tasks assessed                                     General Comments  Pt able to donn/doff BLE prosthetic with minA for LB needs as pt fatigued easily and assisted  at home with LB needs. Pt requied bed rails and would benefit from hospital bed for independence. Pt ambulating well in room with RW and prosthesis.     Exercises     Shoulder Instructions      Home Living Family/patient expects to be discharged to:: Private residence Living Arrangements: Other (Comment)(mobile home) Available Help at Discharge: Family;Available PRN/intermittently Type of Home: Mobile home Home Access: Stairs to enter Entrance Stairs-Number of Steps: 4 Entrance Stairs-Rails: Can reach both Home Layout: One level     Bathroom Shower/Tub: Occupational psychologist: Standard     Home Equipment: Adaptive equipment;Walker - 2 wheels;Toilet riser Adaptive Equipment: Reacher;Long-handled sponge Additional Comments: lives  with niece and family      Prior Functioning/Environment Level of Independence: Independent with assistive device(s)        Comments: mod I for ADL, uses RW for ambulation, family assists with cooking, cleaning        OT Problem List: Decreased activity tolerance;Impaired balance (sitting and/or standing)      OT Treatment/Interventions:      OT Goals(Current goals can be found in the care plan section)    OT Frequency:     Barriers to D/C:            Co-evaluation              AM-PAC OT "6 Clicks" Daily Activity     Outcome Measure Help from another person eating meals?: None Help from another person taking care of personal grooming?: None Help from another person toileting, which includes using toliet, bedpan, or urinal?: A Little Help from another person bathing (including washing, rinsing, drying)?: A Little Help from another person to put on and taking off regular upper body clothing?: None Help from another person to put on and taking off regular lower body clothing?: A Little 6 Click Score: 21   End of Session Equipment Utilized During Treatment: Rolling walker Nurse Communication: Mobility status  Activity Tolerance: Patient tolerated treatment well Patient left: in bed;with call bell/phone within reach  OT Visit Diagnosis: History of falling (Z91.81);Muscle weakness (generalized) (M62.81)                Time: 9211-9417 OT Time Calculation (min): 21 min Charges:  OT General Charges $OT Visit: 1 Visit OT Evaluation $OT Eval Moderate Complexity: 1 Mod  Darryl Nestle) Marsa Aris OTR/L Acute Rehabilitation Services Pager: [phone] Office: [phone]   Fredda Hammed 08/17/2018, 12:14 PM

## 2018-08-17 NOTE — Discharge Summary (Signed)
Physician Discharge Summary   Patient ID: Travis WERLING Sr. MRN: [id number] DOB/AGE: 1951-03-31 68 y.o.  Admit date: 08/15/2018 Discharge date: 08/17/2018  Primary Care Physician:  [name], MD   Recommendations for Outpatient Follow-up:  1. Follow up with PCP in 2 weeks 2. Outpatient follow-up with his cardiologist in 2 to 3 weeks  Home Health: home health PT, OT. Equipment/Devices:   Discharge Condition: stable  CODE STATUS: FULL  Diet recommendation: Carb modified diet   Discharge Diagnoses:    UTI with acute cystitis Elevated troponin with history of CAD, likely due to demand ischemia Chronic back pain/chronic pain syndrome Hypokalemia Hypomagnesemia OSA Bipolar disorder Diabetes mellitus, diet controlled Chronic diastolic CHF     Consults:  Oncology via phone consultation Cardiology via phone consultation    Allergies:  No Known Allergies   DISCHARGE MEDICATIONS: Allergies as of 08/17/2018   No Known Allergies     Medication List    TAKE these medications   amoxicillin 500 MG capsule Commonly known as:  AMOXIL Take 2,000 mg by mouth See admin instructions. Take 2000 mg by mouth 1 hour prior to dental appointment   ARIPiprazole 5 MG tablet Commonly known as:  ABILIFY Take 1 tablet (5 mg total) by mouth at bedtime.   aspirin 81 MG tablet Take 81 mg by mouth at bedtime.   atorvastatin 20 MG tablet Commonly known as:  LIPITOR Take 1 tablet (20 mg total) by mouth daily. What changed:  when to take this   benztropine 0.5 MG tablet Commonly known as:  COGENTIN Take 1 tablet (0.5 mg total) by mouth at bedtime.   cephALEXin 500 MG capsule Commonly known as:  KEFLEX Take 1 capsule (500 mg total) by mouth 2 (two) times daily for 5 days.   clonazePAM 0.5 MG tablet Commonly known as:  KLONOPIN Take 1 tablet (0.5 mg total) by mouth at bedtime.   cyclobenzaprine 5 MG tablet Commonly known as:  FLEXERIL Take 1 tablet (5 mg total) by  mouth 3 (three) times daily as needed for up to 30 days for muscle spasms.   DULoxetine 30 MG capsule Commonly known as:  CYMBALTA Take 1 capsule (30 mg total) by mouth 2 (two) times daily.   Fish Oil 1000 MG Caps Take 1,000 mg by mouth at bedtime.   gabapentin 600 MG tablet Commonly known as:  NEURONTIN Take 1 tablet (600 mg total) by mouth 3 (three) times daily.   hydrochlorothiazide 25 MG tablet Commonly known as:  HYDRODIURIL Take 25 mg by mouth at bedtime.   HYDROcodone-acetaminophen 5-325 MG tablet Commonly known as:  NORCO/VICODIN Take 1 tablet by mouth every 6 (six) hours as needed (for pain).   Melatonin 5 MG Tabs Take 20 mg by mouth at bedtime.   Myrbetriq 50 MG Tb24 tablet Generic drug:  mirabegron ER Take 50 mg by mouth at bedtime.   oxymetazoline 0.05 % nasal spray Commonly known as:  AFRIN Place 1 spray into both nostrils 2 (two) times daily as needed for congestion.   pantoprazole 40 MG tablet Commonly known as:  PROTONIX TAKE ONE TABLET BY MOUTH EVERY DAY   prasugrel 5 MG Tabs tablet Commonly known as:  EFFIENT TAKE ONE TABLET BY MOUTH EVERY DAY What changed:  when to take this   traZODone 150 MG tablet Commonly known as:  DESYREL Take 1 tablet (150 mg total) by mouth at bedtime.        Brief H and P: For complete details  please refer to admission H and P, but in brief Travis [name] is a 68 y.o. M with chronic pain, fibromyalgia, HTN, CAD s/p PCI x9 last >10 yrs, dCHF, Bipolar disorder, hx of right BKA due to diabetic foot infection, no longer diabetic who presented with fever, dysuria, hematuria. Patient was in usual state of health until two days prior to admission, started having fever, dysuria, hematuria. He collapsed because his "legs gave out".  The day of admission, he called his [place] who recommended he call 9-1-1 (unclear if this was due to fall, chest pain, or fever).  EMS initially presented, found his vital signs stable and  recommended no transfer in setting of pandemic.  Patient then with chest pain, called EMS again and was transported. In ER, CXR clear.  No hypoxia, or tachypnea.  He was given nitro x1 by EMS and was hypotensive on arrival.  No leukocytosis, but fever to 101F.  Started on broad spectrum empiric antibiotics and admitted for fever of unknown source.  Hospital Course:   Fever secondary to UTI/acute cystitis -Lactate, heart rate, respiratory rate normal on admission, no leukocytosis.  Patient had received nitro hence BP was low.  Symptoms localized to UTI.  Chest x-ray clear, no infiltrates. CT head, MRI spine negative for foci of infection.  Patient self catheterizes bladder twice per month - He was placed on IV broad-spectrum antibiotics, then transition to IV Rocephin.  Urine culture showed less than 10,000 colonies, insignificant growth, possibly colonization.  Blood cultures negative till date -  Patient was transitioned to oral Keflex for another 5 days to complete course for acute cystitis.  Influenza panel negative, respiratory virus panel negative.  Dr. Loleta Books had discussed with infectious disease, Dr. Megan Salon, he has no epidemiologic risk factors for SARS-CoV-2 (is homebound, no family travel or illness), he clearly does not have COVID clinically, so CoV testing not warranted, exceedingly low likelihood of disease.    Elevated troponin with chest pain, history of CAD -Likely due to demand ischemia.  Improved after nitro in ED, returned overnight and resolved with [place] with Dr. Saunders Revel.  On Effient, aspirin, statin at baseline.  Last PCI >10 years ago, stress test last fall was low risk.   - Has had troponin leak in past (May 2016) when he was admitted with anemia and syncope from anemia.   -Trop 0.07 >> 0.09> 0.05.  ECG unremarkable, no active chest pain at rest.  -Dr. Loleta Books discussed with Dr. Tamala Julian from cardiology who reviewed chart, labs, ECGs, recommended no ischemic  work-up at this time.  Likely chest pain not anginal or unstable ischemic heart disease requiring cath at this time. -CT angiogram of the chest was negative for PE However radiology feel that soft tissue malignancy (I.e. neurogenic tumor, metastasis or lymphoma) is not ruled out, they recommend oncologic work up.  Dr. Loleta Books discussed with oncology, who reviewed charts, felt suspicion of malignancy is low, recent PSA normal, last colonoscopy in 10/2014.  MRI of the thoracic spine showed mild marrow edema in the medial right fourth rib, findings favored to reflect active degenerative inflammatory changes related to right fourth costovertebral and costotransverse joint osteoarthritis with possible superimposed muscle strain.  - Oncology recmomend f/u with his PCP Dr. Jenny Reichmann, and may consider a repeated CT scan in a few months.      Acute on chronic back pain, possible muscle strain Pain present last 2 months, slowly worsening.  No other associated pains.  No joint pains, rashes.  No injury.  No swelling.  Pain worse with movement or deep breath. -MRI of the thoracic spine showed no osteomyelitis or discitis, no acute osseous abnormality, findings favored to reflect reactive degenerative inflammatory changes related to right fourth costovertebral and costotransverse joint osteoarthritis with possible superimposed muscle strain.  No abscess   Hypokalemia Hypomagnesemia Replaced   Hyponatremia Mild, asymptomatic proved, 136 at the time of discharge.  Anemia of chronic disease Stable relative to baseline ~10 g/dL  Chronic pain -Continue Norco -Continue trazodone   Bipolar disorder No active disease -Resume Cogentin, Abilify, Cymbalta  OSA  History of diabetes Apparently this resolved after his right BKA.   A1c 5.2% in Jan this year.  Chronic diastolic CHF Appears euvolemic to dehydrated.  Not on baseline diuretics.     Day of Discharge S: No acute issues, no fevers,  awaiting PT for discharge.  Currently no chest pain.  BP (!) 166/62 (BP Location: Left Arm)   Pulse (!) 52   Temp 98.8 F (37.1 C) (Oral)   Resp 19   Ht 6' 1"  (1.854 m)   Wt (!) 145.8 kg   SpO2 98%   BMI 42.42 kg/m   Physical Exam: General: Alert and awake oriented x3 not in any acute distress. HEENT: anicteric sclera, pupils reactive to light and accommodation CVS: S1-S2 clear no murmur rubs or gallops Chest: clear to auscultation bilaterally, no wheezing rales or rhonchi Abdomen: soft nontender, nondistended, normal bowel sounds Extremities: Right BKA Neuro: Cranial nerves II-XII intact, no focal neurological deficits   The results of significant diagnostics from this hospitalization (including imaging, microbiology, ancillary and laboratory) are listed below for reference.      Procedures/Studies:  Ct Head Wo Contrast  Result Date: 08/15/2018 CLINICAL DATA:  Recent fall with headaches, initial encounter EXAM: CT HEAD WITHOUT CONTRAST TECHNIQUE: Contiguous axial images were obtained from the base of the skull through the vertex without intravenous contrast. COMPARISON:  04/23/2014 FINDINGS: Brain: No evidence of acute infarction, hemorrhage, hydrocephalus, extra-axial collection or mass lesion/mass effect. Vascular: No hyperdense vessel or unexpected calcification. Skull: Normal. Negative for fracture or focal lesion. Sinuses/Orbits: Paranasal sinuses demonstrates mucosal thickening as well as prior surgical changes in the maxillary antra bilaterally. Other: None. IMPRESSION: No acute intracranial abnormality noted. Mild sinus disease. Electronically Signed   By: Inez Catalina M.D.   On: 08/15/2018 18:15   Ct Angio Chest Pe W Or Wo Contrast  Result Date: 08/16/2018 CLINICAL DATA:  Chest pain and shortness of breath. Elevated D-dimer. EXAM: CT ANGIOGRAPHY CHEST WITH CONTRAST TECHNIQUE: Multidetector CT imaging of the chest was performed using the standard protocol during bolus  administration of intravenous contrast. Multiplanar CT image reconstructions and MIPs were obtained to evaluate the vascular anatomy. CONTRAST:  166m OMNIPAQUE IOHEXOL 350 MG/ML SOLN COMPARISON:  Chest x-ray from yesterday. CT thoracic myelogram dated January 16, 2018. CT chest dated January 30, 2013. FINDINGS: Cardiovascular: Satisfactory opacification of the pulmonary arteries to the segmental level. No evidence of pulmonary embolism. Normal heart size. No pericardial effusion. No thoracic aortic aneurysm. Coronary and aortic arch atherosclerotic vascular disease. Mediastinum/Nodes: No enlarged mediastinal, hilar, or axillary lymph nodes. Thyroid gland, trachea, and esophagus demonstrate no significant findings. Lungs/Pleura: Small right pleural effusion. No consolidation or pneumothorax. No suspicious pulmonary nodule. Upper Abdomen: No acute abnormality. Musculoskeletal: As on the MRI from earlier today, there is a small amount of soft tissue density along the medial aspect of the right fourth posterior rib, measuring 2.6 cm. There is some  associated cortical thickening of the rib. Both of these findings are new since August 2019. There is no acute fracture, focal bony lesion, or bony destruction. Review of the MIP images confirms the above findings. IMPRESSION: 1.  No evidence of pulmonary embolism. 2. Small right pleural effusion. 3. Small amount of soft tissue density along the medial aspect of the right fourth posterior rib with some associated cortical thickening of the rib, new since August 2019. No acute fracture, focal bony lesion, or bony destruction. Appearance is nonspecific and although the reactive sclerosis could potentially be seen with chronic costovertebral and costotransverse osteoarthritis, chronic infection, or Paget's disease, the adjacent soft tissue density would be unusual for these etiologies and is therefore concerning for potential underlying neoplasm such as a neurogenic tumor,  metastasis, or lymphoma. Appropriate oncologic workup is suggested. Short-term interval follow-up CT of the chest with contrast to evaluate for interval change is also recommended. 4.  Aortic atherosclerosis (ICD10-I70.0). Electronically Signed   By: Titus Dubin M.D.   On: 08/16/2018 17:11   Mr Thoracic Spine W Wo Contrast  Addendum Date: 08/16/2018   ADDENDUM REPORT: 08/16/2018 10:28 ADDENDUM: Upon further review, there is mild marrow edema in the medial right fourth rib with a small amount of surrounding soft tissue edema extending into the previously described right superior transversospinalis and erector spinae muscle edema. There is no osseous destruction or cortical irregularity of the rib. Findings are favored to reflect active degenerative inflammatory changes related to right fourth costovertebral and costotransverse joint osteoarthritis with possible superimposed muscle strain. Infection is considered much less likely. No abscess. Electronically Signed   By: Titus Dubin M.D.   On: 08/16/2018 10:28   Result Date: 08/16/2018 CLINICAL DATA:  Severe upper back pain for the past few weeks. Fever. EXAM: MRI THORACIC WITHOUT AND WITH CONTRAST TECHNIQUE: Multiplanar and multiecho pulse sequences of the thoracic spine were obtained without and with intravenous contrast. CONTRAST:  10 mL Gadavist intravenous contrast. COMPARISON:  CT thoracic myelogram dated January 16, 2018. FINDINGS: Alignment:  Physiologic. Vertebrae: No evidence of discitis. There is an enhancing Schmorl's node at T11-T12 with adjacent mild marrow endplate edema and enhancement. The endplate cortex is preserved. Additional chronic Schmorl's node at T5-T6 with surrounding chronic fatty marrow endplate changes. There is mild enhancement of the T4-T5 and T7-T8 intervertebral discs without surrounding endplate marrow changes. No fracture or suspicious bone lesion. Cord: Again seen is focal mass effect on the posterior cord at T7,  asymmetric to the left. No abnormal cord signal. No intradural enhancement. Paraspinal and other soft tissues: Prominent edema and enhancement within the superior right transversospinalis and erector spinae muscles. No paraspinal inflammatory changes. Trace bilateral pleural effusions. Disc levels: T1-T2: Negative disc. Unchanged bilateral facet joint ankylosis. Unchanged mild bilateral neuroforaminal stenosis. No spinal canal stenosis. T2-T3: Negative disc. Mild bilateral facet arthropathy. Unchanged mild right neuroforaminal stenosis. No spinal canal or left neuroforaminal stenosis. T3-T4: Mild disc height loss without significant disc bulge or herniation. Mild right facet arthropathy. Unchanged mild right neuroforaminal stenosis. No spinal canal or left neuroforaminal stenosis. T4-T5: Unchanged moderate to severe disc height loss without significant disc bulge or herniation. Moderate right facet arthropathy. No stenosis. T5-T6: New shallow broad-based posterior disc protrusion. Moderate right and mild left facet arthropathy. No stenosis. T6-T7: Negative disc.  Facet joint ankylosis.  No stenosis. T7-T8: Unchanged tiny left paracentral disc osteophyte complex. Facet joint ankylosis. No stenosis. T8-T9: Negative disc.  Mild left facet arthropathy.  No stenosis. T9-T10: Negative  disc. Mild bilateral facet arthropathy. No stenosis. T10-T11: Negative. T11-T12: Mild disc bulging. Moderate bilateral facet arthropathy. No stenosis. IMPRESSION: 1. No evidence of osteomyelitis-discitis. No acute osseous abnormality. 2. Prominent edema within the superior right transversospinalis and erector spinae muscles is nonspecific but could reflect severe strain or myositis. 3. Unchanged focal mass effect on the posterior thoracic cord at T7. No definite underlying arachnoid cyst identified. This could reflect an arachnoid web. 4. Relatively unchanged multilevel thoracic spondylosis as described above. No high-grade stenosis. 5.  Trace bilateral pleural effusions. Electronically Signed: By: Titus Dubin M.D. On: 08/16/2018 09:43   Dg Chest Port 1 View  Result Date: 08/15/2018 CLINICAL DATA:  Chest pain and fever for 2 days EXAM: PORTABLE CHEST 1 VIEW COMPARISON:  01/14/2018 FINDINGS: Cardiac shadow is stable. The lungs are well aerated bilaterally. No focal infiltrate or sizable effusion is seen. No acute bony abnormality is noted. IMPRESSION: No active disease. Electronically Signed   By: Inez Catalina M.D.   On: 08/15/2018 17:13       LAB RESULTS: Basic Metabolic Panel: Recent Labs  Lab 08/15/18 2308 08/16/18 0633 08/17/18 0347  NA  --  134* 136  K  --  3.4* 3.8  CL  --  100 104  CO2  --  22 22  GLUCOSE  --  147* 157*  BUN  --  17 11  CREATININE  --  1.02 0.84  CALCIUM  --  8.1* 8.5*  MG 1.5*  --   --    Liver Function Tests: Recent Labs  Lab 08/15/18 1653  AST 34  ALT 19  ALKPHOS 60  BILITOT 1.0  PROT 6.3*  ALBUMIN 3.4*   No results for input(s): LIPASE, AMYLASE in the last 168 hours. No results for input(s): AMMONIA in the last 168 hours. CBC: Recent Labs  Lab 08/15/18 1653 08/16/18 0633 08/17/18 0347  WBC 6.5 4.0 3.5*  NEUTROABS 5.7  --   --   HGB 10.6* 10.0* 10.2*  HCT 33.3* 30.6* 32.3*  MCV 83.3 83.4 82.6  PLT 168 153 166   Cardiac Enzymes: Recent Labs  Lab 08/15/18 2308  08/16/18 1404 08/16/18 1814  CKTOTAL 615*  --   --   --   TROPONINI 0.07*   < > 0.06* 0.05*   < > = values in this interval not displayed.   BNP: Invalid input(s): POCBNP CBG: Recent Labs  Lab 08/16/18 2057 08/17/18 0735  GLUCAP 140* 135*      Disposition and Follow-up: Discharge Instructions    Diet Carb Modified   Complete by:  As directed    Increase activity slowly   Complete by:  As directed        DISPOSITION: Awaiting PT evaluation for DC home   DISCHARGE FOLLOW-UP Follow-up Information    [name], MD. Schedule an appointment as soon as possible for a visit in 1  week(s).   Specialties:  Internal Medicine, Radiology Contact information: [address] [place] [address] [phone]        Burnell Blanks, MD. Schedule an appointment as soon as possible for a visit in 2 week(s).   Specialty:  Cardiology Contact information: [place] [address] [place] [address] [phone]            Time coordinating discharge:  26mns   Signed:   REstill CottaM.D. [facility] 08/17/2018, 9:56 AM

## 2018-08-17 NOTE — Evaluation (Signed)
Physical Therapy Evaluation Patient Details Name: Travis MUEGGE Sr. MRN: [id number] DOB: Oct 19, 1950 Today's Date: 08/17/2018   History of Present Illness  Pt is a 68 yo male s/p fall, fever and chest pain with elevated troponins. ECG negative for cardiac events throughout testing. Pt Pmhx: chronic pain syndrome. HTN, CAD, R BKA, back surgery , bipolar d/o.   Clinical Impression  Patient was able to ambulate with supervision. He independiently doned his prosthetic. He did not perform steps 2nd to already being fatigued, and being near discharge. The patient feels comfortable with his steps at home and nothing with his mobility at this time leads therapy to believe he will not be able to get up his 4 short steps going up stairs. The patient would benefit from home health therapy and further acute therapy if he stays in the hospital.      Follow Up Recommendations Home health PT    Equipment Recommendations  Rolling walker with 5" wheels    Recommendations for Other Services       Precautions / Restrictions Precautions Precautions: Fall Restrictions Weight Bearing Restrictions: No      Mobility  Bed Mobility Overal bed mobility: Modified Independent             General bed mobility comments: Per Ot visit. Patient found at the edge of the bed   Transfers Overall transfer level: Needs assistance Equipment used: Rolling walker (2 wheeled) Transfers: Sit to/from [facility] Sit to Stand: Supervision Stand pivot transfers: Supervision       General transfer comment: supervision for safety- no assist required with RW  Ambulation/Gait Ambulation/Gait assistance: Supervision Gait Distance (Feet): 20 Feet Assistive device: Rolling walker (2 wheeled) Gait Pattern/deviations: (flexed trunk) Gait velocity: decreased from baseline    General Gait Details: Patient ambulated with a flexed trunk. He was already fatigued from standing and putting on his pants  prior to walking.   Stairs            Wheelchair Mobility    Modified Rankin (Stroke Patients Only)       Balance Overall balance assessment: Modified Independent                                           Pertinent Vitals/Pain Pain Assessment: No/denies pain    Home Living Family/patient expects to be discharged to:: Private residence Living Arrangements: Other (Comment) Available Help at Discharge: Family;Available PRN/intermittently Type of Home: Mobile home Home Access: Stairs to enter Entrance Stairs-Rails: Can reach both Entrance Stairs-Number of Steps: 4 Home Layout: One level Home Equipment: Adaptive equipment;Walker - 2 wheels;Toilet riser Additional Comments: lives with niece and family    Prior Function Level of Independence: Independent with assistive device(s)         Comments: mod I for ADL, uses RW for ambulation, family assists with cooking, cleaning     Hand Dominance   Dominant Hand: Right    Extremity/Trunk Assessment   Upper Extremity Assessment Upper Extremity Assessment: Defer to OT evaluation    Lower Extremity Assessment Lower Extremity Assessment: Overall WFL for tasks assessed RLE Deficits / Details: R BKA w/ prosthetic    Cervical / Trunk Assessment Cervical / Trunk Assessment: Normal  Communication   Communication: No difficulties  Cognition Arousal/Alertness: Awake/alert Behavior During Therapy: WFL for tasks assessed/performed Overall Cognitive Status: Within Functional Limits for tasks assessed  General Comments General comments (skin integrity, edema, etc.): Pt able to donn/doff BLE prosthetic with minA for LB needs as pt fatigued easily and assisted at home with LB needs. Pt requied bed rails and would benefit from hospital bed for independence. Pt ambulating well in room with RW and prosthesis.     Exercises     Assessment/Plan    PT  Assessment Patient needs continued PT services  PT Problem List Decreased strength;Decreased range of motion;Decreased activity tolerance;Obesity;Pain;Decreased mobility       PT Treatment Interventions DME instruction;Gait training;Stair training;Functional mobility training;Therapeutic activities;Therapeutic exercise;Manual techniques    PT Goals (Current goals can be found in the Care Plan section)  Acute Rehab PT Goals Patient Stated Goal: to go home  PT Goal Formulation: With patient Potential to Achieve Goals: Good    Frequency Min 3X/week   Barriers to discharge        Co-evaluation PT/OT/SLP Co-Evaluation/Treatment: (Therapy came in at the end of OT Eval 2nd to pt D/C )             AM-PAC PT "6 Clicks" Mobility  Outcome Measure Help needed turning from your back to your side while in a flat bed without using bedrails?: A Little Help needed moving from lying on your back to sitting on the side of a flat bed without using bedrails?: A Little Help needed moving to and from a bed to a chair (including a wheelchair)?: A Little Help needed standing up from a chair using your arms (e.g., wheelchair or bedside chair)?: A Little Help needed to walk in hospital room?: A Little Help needed climbing 3-5 steps with a railing? : A Little 6 Click Score: 18    End of Session Equipment Utilized During Treatment: Gait belt Activity Tolerance: Patient tolerated treatment well Patient left: (seated at the edge of the bed ) Nurse Communication: Mobility status PT Visit Diagnosis: Unsteadiness on feet (R26.81);Muscle weakness (generalized) (M62.81);Difficulty in walking, not elsewhere classified (R26.2);Pain Pain - part of body: (back)    Time: 1150-1200 PT Time Calculation (min) (ACUTE ONLY): 10 min   Charges:   PT Evaluation $PT Eval Moderate Complexity: 1 Mod            Carney Living PT DPT  08/17/2018, 1:18 PM

## 2018-08-17 NOTE — TOC Transition Note (Signed)
Transition of Care [facility]) - CM/SW Discharge Note   Patient Details  Name: Travis HELM Sr. MRN: [id number] Date of Birth: 03-28-1951  [facility] CM/SW Contact:  Claudie Leach, RN Phone Number: 08/17/2018, 1:02 PM   Final next level of care: [place] Barriers to Discharge: No Barriers Identified   Patient Goals and CMS Choice Patient states their goals for this hospitalization and ongoing recovery are:: to return home CMS Medicare.gov Compare Post Acute Care list provided to:: Patient Choice offered to / list presented to : Patient  Discharge Plan and Services   Discharge Planning Services: CM Consult Post Acute Care Choice: Home Health              HH Arranged: PT, OT [place] Agency: [facility] ([facility])     Resume services with [facility].  Alwyn Ren with [facility] advised.

## 2018-08-19 ENCOUNTER — Telehealth: Payer: Self-pay | Admitting: Internal Medicine

## 2018-08-19 NOTE — Telephone Encounter (Signed)
Routing to dr Jenny Reichmann, please advise, thanks

## 2018-08-19 NOTE — Telephone Encounter (Signed)
Left message advising patient that we can wait on hospital follow up appt per dr Gwynn Burly note---call back if any further quesitons or if symptoms worsen and he needs to be seen

## 2018-08-19 NOTE — Telephone Encounter (Signed)
Ok to reschedule for later, thanks

## 2018-08-19 NOTE — Telephone Encounter (Signed)
Patient states he was told to have a hospital follow up within one week.  Patient does not have access to V visit.  Patient would like to know if he needs to come in or hold off on appointment.

## 2018-08-20 ENCOUNTER — Telehealth (INDEPENDENT_AMBULATORY_CARE_PROVIDER_SITE_OTHER): Payer: Self-pay | Admitting: Radiology

## 2018-08-20 DIAGNOSIS — M4802 Spinal stenosis, cervical region: Secondary | ICD-10-CM | POA: Diagnosis not present

## 2018-08-20 DIAGNOSIS — I110 Hypertensive heart disease with heart failure: Secondary | ICD-10-CM | POA: Diagnosis not present

## 2018-08-20 DIAGNOSIS — I25118 Atherosclerotic heart disease of native coronary artery with other forms of angina pectoris: Secondary | ICD-10-CM | POA: Diagnosis not present

## 2018-08-20 DIAGNOSIS — I5023 Acute on chronic systolic (congestive) heart failure: Secondary | ICD-10-CM | POA: Diagnosis not present

## 2018-08-20 DIAGNOSIS — M48062 Spinal stenosis, lumbar region with neurogenic claudication: Secondary | ICD-10-CM | POA: Diagnosis not present

## 2018-08-20 DIAGNOSIS — M5116 Intervertebral disc disorders with radiculopathy, lumbar region: Secondary | ICD-10-CM | POA: Diagnosis not present

## 2018-08-20 DIAGNOSIS — Z4789 Encounter for other orthopedic aftercare: Secondary | ICD-10-CM | POA: Diagnosis not present

## 2018-08-20 DIAGNOSIS — M4726 Other spondylosis with radiculopathy, lumbar region: Secondary | ICD-10-CM | POA: Diagnosis not present

## 2018-08-20 DIAGNOSIS — E1151 Type 2 diabetes mellitus with diabetic peripheral angiopathy without gangrene: Secondary | ICD-10-CM | POA: Diagnosis not present

## 2018-08-20 LAB — CULTURE, BLOOD (ROUTINE X 2)
Culture: NO GROWTH
Culture: NO GROWTH

## 2018-08-20 NOTE — Telephone Encounter (Signed)
Thanks, he was at [facility] and PT at home should help improve his independence.

## 2018-08-20 NOTE — Telephone Encounter (Signed)
Called to Let Dr. Louanne Skye know that they are able to see patient today to get him started with there treatment.

## 2018-08-21 ENCOUNTER — Encounter: Payer: Self-pay | Admitting: Neurology

## 2018-08-21 ENCOUNTER — Telehealth (INDEPENDENT_AMBULATORY_CARE_PROVIDER_SITE_OTHER): Payer: Self-pay | Admitting: Specialist

## 2018-08-21 ENCOUNTER — Other Ambulatory Visit (HOSPITAL_COMMUNITY): Payer: Self-pay | Admitting: Psychiatry

## 2018-08-21 DIAGNOSIS — M5116 Intervertebral disc disorders with radiculopathy, lumbar region: Secondary | ICD-10-CM | POA: Diagnosis not present

## 2018-08-21 DIAGNOSIS — M4802 Spinal stenosis, cervical region: Secondary | ICD-10-CM | POA: Diagnosis not present

## 2018-08-21 DIAGNOSIS — F411 Generalized anxiety disorder: Secondary | ICD-10-CM

## 2018-08-21 DIAGNOSIS — I110 Hypertensive heart disease with heart failure: Secondary | ICD-10-CM | POA: Diagnosis not present

## 2018-08-21 DIAGNOSIS — Z4789 Encounter for other orthopedic aftercare: Secondary | ICD-10-CM | POA: Diagnosis not present

## 2018-08-21 DIAGNOSIS — I25118 Atherosclerotic heart disease of native coronary artery with other forms of angina pectoris: Secondary | ICD-10-CM | POA: Diagnosis not present

## 2018-08-21 DIAGNOSIS — M4726 Other spondylosis with radiculopathy, lumbar region: Secondary | ICD-10-CM | POA: Diagnosis not present

## 2018-08-21 DIAGNOSIS — F319 Bipolar disorder, unspecified: Secondary | ICD-10-CM

## 2018-08-21 DIAGNOSIS — I5023 Acute on chronic systolic (congestive) heart failure: Secondary | ICD-10-CM | POA: Diagnosis not present

## 2018-08-21 DIAGNOSIS — M48062 Spinal stenosis, lumbar region with neurogenic claudication: Secondary | ICD-10-CM | POA: Diagnosis not present

## 2018-08-21 DIAGNOSIS — E1151 Type 2 diabetes mellitus with diabetic peripheral angiopathy without gangrene: Secondary | ICD-10-CM | POA: Diagnosis not present

## 2018-08-21 NOTE — Telephone Encounter (Signed)
Malorie from [facility] called to request VO for [facility] OT for 1x a week for 1 week and 2x a week for 3 weeks.  CB#[phone].  Thank you.

## 2018-08-21 NOTE — Telephone Encounter (Signed)
Called and advised.

## 2018-08-23 DIAGNOSIS — I25118 Atherosclerotic heart disease of native coronary artery with other forms of angina pectoris: Secondary | ICD-10-CM | POA: Diagnosis not present

## 2018-08-23 DIAGNOSIS — M5116 Intervertebral disc disorders with radiculopathy, lumbar region: Secondary | ICD-10-CM | POA: Diagnosis not present

## 2018-08-23 DIAGNOSIS — Z4789 Encounter for other orthopedic aftercare: Secondary | ICD-10-CM | POA: Diagnosis not present

## 2018-08-23 DIAGNOSIS — I5023 Acute on chronic systolic (congestive) heart failure: Secondary | ICD-10-CM | POA: Diagnosis not present

## 2018-08-23 DIAGNOSIS — I110 Hypertensive heart disease with heart failure: Secondary | ICD-10-CM | POA: Diagnosis not present

## 2018-08-23 DIAGNOSIS — M48062 Spinal stenosis, lumbar region with neurogenic claudication: Secondary | ICD-10-CM | POA: Diagnosis not present

## 2018-08-23 DIAGNOSIS — E1151 Type 2 diabetes mellitus with diabetic peripheral angiopathy without gangrene: Secondary | ICD-10-CM | POA: Diagnosis not present

## 2018-08-23 DIAGNOSIS — M4726 Other spondylosis with radiculopathy, lumbar region: Secondary | ICD-10-CM | POA: Diagnosis not present

## 2018-08-23 DIAGNOSIS — M4802 Spinal stenosis, cervical region: Secondary | ICD-10-CM | POA: Diagnosis not present

## 2018-08-26 ENCOUNTER — Other Ambulatory Visit: Payer: Self-pay | Admitting: Internal Medicine

## 2018-08-26 DIAGNOSIS — M5116 Intervertebral disc disorders with radiculopathy, lumbar region: Secondary | ICD-10-CM | POA: Diagnosis not present

## 2018-08-26 DIAGNOSIS — M4726 Other spondylosis with radiculopathy, lumbar region: Secondary | ICD-10-CM | POA: Diagnosis not present

## 2018-08-26 DIAGNOSIS — M4802 Spinal stenosis, cervical region: Secondary | ICD-10-CM | POA: Diagnosis not present

## 2018-08-26 DIAGNOSIS — Z4789 Encounter for other orthopedic aftercare: Secondary | ICD-10-CM | POA: Diagnosis not present

## 2018-08-26 DIAGNOSIS — M48062 Spinal stenosis, lumbar region with neurogenic claudication: Secondary | ICD-10-CM | POA: Diagnosis not present

## 2018-08-26 DIAGNOSIS — I5023 Acute on chronic systolic (congestive) heart failure: Secondary | ICD-10-CM | POA: Diagnosis not present

## 2018-08-26 DIAGNOSIS — I110 Hypertensive heart disease with heart failure: Secondary | ICD-10-CM | POA: Diagnosis not present

## 2018-08-26 DIAGNOSIS — E1151 Type 2 diabetes mellitus with diabetic peripheral angiopathy without gangrene: Secondary | ICD-10-CM | POA: Diagnosis not present

## 2018-08-26 DIAGNOSIS — I25118 Atherosclerotic heart disease of native coronary artery with other forms of angina pectoris: Secondary | ICD-10-CM | POA: Diagnosis not present

## 2018-08-26 NOTE — Telephone Encounter (Signed)
Done erx 

## 2018-08-27 DIAGNOSIS — R6889 Other general symptoms and signs: Secondary | ICD-10-CM | POA: Diagnosis not present

## 2018-08-27 DIAGNOSIS — N481 Balanitis: Secondary | ICD-10-CM | POA: Diagnosis not present

## 2018-08-27 DIAGNOSIS — R310 Gross hematuria: Secondary | ICD-10-CM | POA: Diagnosis not present

## 2018-08-28 ENCOUNTER — Telehealth (INDEPENDENT_AMBULATORY_CARE_PROVIDER_SITE_OTHER): Payer: Self-pay | Admitting: Radiology

## 2018-08-28 NOTE — Telephone Encounter (Signed)
COVID-19 Questions 1- Do you have now or have you had in the past seven (7) days, had a fever and/or chills?--NO  2- Do you have now or have you had in the past seven (7) days, had a cough?--NO  3- 1 Do you have now or have you had in the past seven (7) days, had nausea, Vomiting, or abdominal pain?--NO  4- Have you been exposed to anyone who has tested positive for COVID-19?--NO   5- Have you, or anyone who lives with you, traveled outside the state within the last month?--NO   Patient will be keeping his appt on 08/29/2018

## 2018-08-29 ENCOUNTER — Ambulatory Visit (INDEPENDENT_AMBULATORY_CARE_PROVIDER_SITE_OTHER): Payer: Medicare HMO | Admitting: Specialist

## 2018-08-29 ENCOUNTER — Telehealth: Payer: Self-pay

## 2018-08-29 ENCOUNTER — Other Ambulatory Visit: Payer: Self-pay

## 2018-08-29 ENCOUNTER — Telehealth: Payer: Self-pay | Admitting: Internal Medicine

## 2018-08-29 ENCOUNTER — Ambulatory Visit: Payer: Medicare HMO | Admitting: Psychiatry

## 2018-08-29 ENCOUNTER — Encounter (INDEPENDENT_AMBULATORY_CARE_PROVIDER_SITE_OTHER): Payer: Self-pay | Admitting: Specialist

## 2018-08-29 VITALS — BP 139/88 | HR 83 | Ht 72.0 in | Wt 327.0 lb

## 2018-08-29 DIAGNOSIS — M5440 Lumbago with sciatica, unspecified side: Secondary | ICD-10-CM | POA: Diagnosis not present

## 2018-08-29 DIAGNOSIS — M4726 Other spondylosis with radiculopathy, lumbar region: Secondary | ICD-10-CM | POA: Diagnosis not present

## 2018-08-29 DIAGNOSIS — Z4789 Encounter for other orthopedic aftercare: Secondary | ICD-10-CM | POA: Diagnosis not present

## 2018-08-29 DIAGNOSIS — I5023 Acute on chronic systolic (congestive) heart failure: Secondary | ICD-10-CM | POA: Diagnosis not present

## 2018-08-29 DIAGNOSIS — E1151 Type 2 diabetes mellitus with diabetic peripheral angiopathy without gangrene: Secondary | ICD-10-CM | POA: Diagnosis not present

## 2018-08-29 DIAGNOSIS — M5116 Intervertebral disc disorders with radiculopathy, lumbar region: Secondary | ICD-10-CM | POA: Diagnosis not present

## 2018-08-29 DIAGNOSIS — I25118 Atherosclerotic heart disease of native coronary artery with other forms of angina pectoris: Secondary | ICD-10-CM | POA: Diagnosis not present

## 2018-08-29 DIAGNOSIS — M48062 Spinal stenosis, lumbar region with neurogenic claudication: Secondary | ICD-10-CM | POA: Diagnosis not present

## 2018-08-29 DIAGNOSIS — I110 Hypertensive heart disease with heart failure: Secondary | ICD-10-CM | POA: Diagnosis not present

## 2018-08-29 DIAGNOSIS — R6889 Other general symptoms and signs: Secondary | ICD-10-CM | POA: Diagnosis not present

## 2018-08-29 DIAGNOSIS — M4802 Spinal stenosis, cervical region: Secondary | ICD-10-CM | POA: Diagnosis not present

## 2018-08-29 NOTE — Telephone Encounter (Signed)
Spoke to patient

## 2018-08-29 NOTE — Progress Notes (Addendum)
Office Visit Note   Patient: Travis BRILES Sr.           Date of Birth: 23-Aug-1950           MRN: [id number] Visit Date: 08/29/2018              Requested by: [name], MD [place], [name] [address] PCP: [name], MD   Assessment & Plan: Visit Diagnoses:  1. Acute right-sided low back pain with sciatica, sciatica laterality unspecified     Plan: *Avoid frequent bending and stooping  No lifting greater than 10 lbs. May use ice or moist heat for pain. Weight loss is of benefit. Bone scan ordered to assess right upper thoracic area for fracture, occult tumor or infection.  Repeat lab, CPR, CBC, Sed rate.  Exercise is important to improve your indurance and does allow people to function better inspite of back pain.    Follow-Up Instructions: Return in about 2 weeks (around 09/12/2018).   Orders:  Orders Placed This Encounter  Procedures   NM Bone Scan Whole Body   C-reactive protein   Sed Rate (ESR)   CBC   No orders of the defined types were placed in this encounter.     Procedures: No procedures performed   Clinical Data: No additional findings.   Subjective: Chief Complaint  Patient presents with   Lower Back - Routine Post Op   Right Shoulder - Pain    68 year old male with history of lumbar laminectomy 07/16/2018. From the chest down his pain is gone but he developed a pain starting about a week ago last Friday, about a week ago, he reports he had pain that was sharp and right sided ago the inner right scapula and radiates to the right chest and substernal on the right side. The pain is on the right side deep.He has inablility to raise the right arm over head for some time. Not able to use the right shoulder in 10 years, Dr. [name] Lighter did surgery 3 times the last with inability to use the right shoulder. He received a letter the other day with a notice that his bariatric surgery has been delayed by [facility], Dr.  Redmond Pulling. No bowel or change in bladder difficulty. Electrodes on the ankle to decrease the bladder leakage. He is to start CPAP machine starting tomorrow. He takes 3 meds.He is standing and walking okay, no shortness of breath. He had PT at home and has a therapist at home today.   Review of Systems  Constitutional: Negative for activity change, appetite change, chills, diaphoresis, fatigue, fever and unexpected weight change.  HENT: Positive for mouth sores. Negative for congestion, dental problem, drooling, ear discharge, ear pain, facial swelling, nosebleeds, postnasal drip, rhinorrhea, sinus pressure, sinus pain, sneezing, sore throat, tinnitus, trouble swallowing and voice change.   Eyes: Negative for photophobia, pain, discharge, redness, itching and visual disturbance.  Respiratory: Negative for apnea, cough, choking, chest tightness, shortness of breath, wheezing and stridor.   Cardiovascular: Positive for chest pain. Negative for palpitations and leg swelling.  Gastrointestinal: Negative for abdominal distention, abdominal pain, anal bleeding, blood in stool, constipation, diarrhea, nausea, rectal pain and vomiting.  Endocrine: Negative for cold intolerance, heat intolerance, polydipsia and polyphagia.  Genitourinary: Positive for difficulty urinating and enuresis. Negative for discharge, dysuria, flank pain, frequency, genital sores, hematuria, penile pain, testicular pain and urgency.  Musculoskeletal: Positive for back pain. Negative for arthralgias, gait  problem, joint swelling, myalgias, neck pain and neck stiffness.  Skin: Negative for color change, pallor, rash and wound.  Allergic/Immunologic: Negative for environmental allergies, food allergies and immunocompromised state.  Neurological: Negative for dizziness, tremors, seizures, syncope, facial asymmetry, speech difficulty, weakness, light-headedness, numbness and headaches.  Hematological: Negative for adenopathy. Does not  bruise/bleed easily.  Psychiatric/Behavioral: Negative for agitation, behavioral problems, confusion, decreased concentration, dysphoric mood, hallucinations, self-injury, sleep disturbance and suicidal ideas. The patient is not nervous/anxious and is not hyperactive.      Objective: Vital Signs: BP 139/88 (BP Location: Left Arm, Patient Position: Sitting)    Pulse 83    Ht 6' (1.829 m)    Wt (!) 327 lb (148.3 kg)    BMI 44.35 kg/m   Physical Exam Constitutional:      Appearance: He is well-developed.  HENT:     Head: Normocephalic and atraumatic.  Eyes:     Pupils: Pupils are equal, round, and reactive to light.  Neck:     Musculoskeletal: Normal range of motion and neck supple.  Pulmonary:     Effort: Pulmonary effort is normal.     Breath sounds: Normal breath sounds.  Abdominal:     General: Bowel sounds are normal.     Palpations: Abdomen is soft.  Skin:    General: Skin is warm and dry.  Neurological:     Mental Status: He is alert and oriented to person, place, and time.  Psychiatric:        Behavior: Behavior normal.        Thought Content: Thought content normal.        Judgment: Judgment normal.     Back Exam   Tenderness  The patient is experiencing tenderness in the thoracic.  Range of Motion  Extension: abnormal  Flexion: abnormal  Lateral bend right: normal  Rotation right: normal  Rotation left: normal   Muscle Strength  Right Quadriceps:  5/5  Left Quadriceps:  5/5  Right Hamstrings:  5/5  Left Hamstrings:  5/5   Tests  Straight leg raise right: negative Straight leg raise left: negative  Reflexes  Patellar: 2/4 Achilles: 2/4 Babinski's sign: normal   Other  Toe walk: abnormal Heel walk: abnormal Sensation: normal Gait: short leg  Erythema: no back redness Scars: absent      Specialty Comments:  No specialty comments available.  Imaging: No results found.   PMFS History: Patient Active Problem List   Diagnosis Date  Noted   Other spondylosis with radiculopathy, lumbar region 07/16/2018    Priority: High    Class: Chronic   Spinal stenosis in cervical region 09/26/2013    Priority: High    Class: Chronic   Spinal stenosis of lumbar region 09/26/2013    Priority: High    Class: Chronic   Fever 08/15/2018   UTI (urinary tract infection) 08/15/2018   Atypical chest pain 08/15/2018   Fall at home, initial encounter 08/15/2018   Chronic anemia 08/15/2018   Hypokalemia 08/15/2018   Bowel incontinence 07/25/2018   Status post lumbar laminectomy 07/16/2018   Status post THR (total hip replacement) 04/17/2018   Unilateral primary osteoarthritis, right hip    Morbid obesity with BMI of 40.0-44.9, adult ([place]) 02/28/2018   Myocardial infarction ([place]) 02/25/2018   Coronary artery disease involving native coronary artery of native heart 02/25/2018   PAD (peripheral artery disease) ([place]) 02/25/2018   S/P BKA (below knee amputation) unilateral, right ([place]) 02/25/2018   Sacroiliitis ([place]) 02/25/2018  SOB (shortness of breath) 01/03/2018   Acute on chronic heart failure ([place]) 01/03/2018   Severe right groin pain 12/20/2017   Morbid obesity ([place]) 10/09/2017   Exposure to sexually transmitted disease (STD) 07/14/2017   Low back pain 07/13/2017   Leukoplakia, tongue 01/26/2017   Acute sinus infection 10/20/2016   Hyperglycemia 10/20/2016   Skin lesion 10/20/2016   S/P unilateral BKA (below knee amputation), right ([place]) 06/14/2016   Charcot foot due to diabetes mellitus ([place])    Charcot's arthropathy associated with type 2 diabetes mellitus ([place]) 04/11/2016   Preventative health care 11/05/2015   Major depression 09/13/2015   S/P TKR (total knee replacement) bilaterally 09/13/2015   GERD (gastroesophageal reflux disease) 09/08/2015   S/P laparoscopic hernia repair 05/11/2015   PPD positive 04/08/2015   Benign neoplasm of descending colon    Benign neoplasm of cecum     AKI (acute kidney injury) ([place]) 10/18/2014   Acute blood loss anemia    Chronic anticoagulation    Occult blood positive stool 10/17/2014   General weakness 07/14/2014   Urinary incontinence 07/14/2014   Headache(784.0) 10/15/2013   Hand joint pain 06/10/2013   Rotator cuff tear arthropathy of both shoulders 06/10/2013   Skin lesion of cheek 05/01/2013   Pain of right thumb 04/03/2013   Balance disorder 03/12/2013   Gait disorder 03/12/2013   Tremor 03/12/2013   Left hip pain 03/12/2013   Pre-ulcerative corn or callous 02/06/2013   Anxiety 11/12/2011   OSA (obstructive sleep apnea) 11/07/2011   Bradycardia 10/20/2011   Insomnia 10/04/2011   Obesity 01/12/2011   ERECTILE DYSFUNCTION, ORGANIC 05/30/2010   [place] DISEASE, LUMBAR 04/19/2010   SCIATICA, LEFT 04/19/2010   Chronic pain syndrome 10/27/2009   Hyperlipidemia 07/15/2009   Essential hypertension 06/24/2009   Coronary artery disease involving native coronary artery of native heart without angina pectoris 06/24/2009   Allergic rhinitis 06/24/2009   URETHRAL STRICTURE 06/24/2009   DEGENERATIVE JOINT DISEASE 06/24/2009   SHOULDER PAIN, BILATERAL 06/24/2009   FATIGUE 06/24/2009   NEPHROLITHIASIS, HX OF 06/24/2009   Past Medical History:  Diagnosis Date   ALLERGIC RHINITIS 06/24/2009   Anxiety 11/12/2011   Adequate for discharge    Arthritis    "all my joints" (09/30/2013)   Arthritis of foot, right, degenerative 04/15/2014   Balance disorder 03/12/2013   Benign neoplasm of cecum    Benign neoplasm of descending colon    CAD (coronary artery disease) 06/24/2009   5 stents placed in 2007     Chronic anticoagulation    Chronic pain syndrome 10/27/2009   of ankle, shoulders, low back.  sciatica.    Closed fracture of right foot 10/17/2014   CORONARY ARTERY DISEASE 06/24/2009   a. s/p multiple PCIs - In 2008 he had a Taxus DES to the mild LAD, Endeavor DES to mid LCX and distal LCX.  In January 2009 he had DES to distal LCX, mid LCX and proximal LCX. In November 2009 had BMS x 2 to the mid RCA. Cath 10/2011 with patent stents, noncardiac CP. LHC 01/2013: patent stents (noncardiac CP).   DEGENERATIVE JOINT DISEASE 06/24/2009   Qualifier: Diagnosis of  By: [name], [name]    Depression    Depression with anxiety    Prior suicide attempt   [place], LUMBAR 04/19/2010   ERECTILE DYSFUNCTION, ORGANIC 05/30/2010   Essential hypertension 06/24/2009   Qualifier: Diagnosis of  By: [name], [name]    Fibromyalgia    Fracture dislocation of ankle  joint 09/02/2015   Gait disorder 03/12/2013   General weakness 07/14/2014   GERD (gastroesophageal reflux disease) 09/08/2015   Hand joint pain 06/10/2013   Heart murmur    Hepatitis C    History of kidney stones    Hyperlipidemia 07/15/2009   Qualifier: Diagnosis of  By: Aundra Dubin, MD, [place]     HYPERLIPIDEMIA-MIXED 07/15/2009   HYPERTENSION 06/24/2009   Insomnia 10/04/2011   Irregular heart beat    Left hip pain 03/12/2013   Injected under ultrasound guidance on June 24, 2013    Major depression 09/13/2015   Myocardial infarction [facility]) 2008   Non-cardiac chest pain 10/2011, 01/2013   Obesity    Occult blood positive stool 10/17/2014   Open ankle fracture 09/02/2015   OSA (obstructive sleep apnea)    not using CPAP (09/30/2013)   Pain of right thumb 04/03/2013   Pneumonia    PPD positive 04/08/2015   Pre-ulcerative corn or callous 02/06/2013   Rotator cuff tear arthropathy of both shoulders 06/10/2013   History of bilateral shoulder cuff surgery for rotator cuff tears. Reports increase in pain 09/11/2015 during physical therapy of the left shoulder.    SCIATICA, LEFT 04/19/2010   Qualifier: Diagnosis of  By: [name], [name]    Spinal stenosis in cervical region 09/26/2013   Spinal stenosis, lumbar region, with neurogenic claudication 09/26/2013   Type II diabetes mellitus ([place]) 2012   no meds in  09/2014.    Uncontrolled type 2 DM with peripheral circulatory disorder ([place]) 10/04/2013   URETHRAL STRICTURE 06/24/2009   self catheterizes.     Family History  Problem Relation Age of Onset   Depression Mother    Heart disease Mother    Hypertension Mother    Breast cancer Mother    Diabetes Father    Heart disease Father        CABG   Hypertension Father    Hyperlipidemia Father    Prostate cancer Father    Skin cancer Father    Depression Brother        x 2   Hypertension Brother        x2   Healthy Son    Heart disease Maternal Grandfather    Early death Maternal Grandfather    Heart attack Maternal Grandfather 10   Early death Paternal Grandfather    Coronary artery disease Other    Hypertension Other    Depression Other    Healthy Son     Past Surgical History:  Procedure Laterality Date   AMPUTATION Right 06/14/2016   Procedure: AMPUTATION BELOW KNEE;  Surgeon: Newt Minion, MD;  Location: [facility];  Service: Orthopedics;  Laterality: Right;   ANKLE FUSION Right 04/15/2014   Procedure: Right Subtalar, Talonavicular Fusion;  Surgeon: Newt Minion, MD;  Location: [facility];  Service: Orthopedics;  Laterality: Right;   ANKLE FUSION Right 04/18/2016   Procedure: Right Ankle Tibiocalcaneal Fusion;  Surgeon: Newt Minion, MD;  Location: [facility];  Service: Orthopedics;  Laterality: Right;   ANTERIOR CERVICAL DECOMP/DISCECTOMY FUSION N/A 09/26/2013   Procedure: ANTERIOR CERVICAL DISCECTOMY FUSION C3-4, plate and screw fixation, allograft bone graft;  Surgeon: Jessy Oto, MD;  Location: [facility];  Service: Orthopedics;  Laterality: N/A;   BACK SURGERY     BELOW KNEE LEG AMPUTATION Right 06/14/2016   CARDIAC CATHETERIZATION  X 1   CARPAL TUNNEL RELEASE Bilateral    COLONOSCOPY     COLONOSCOPY N/A 10/22/2014   Procedure:  COLONOSCOPY;  Surgeon: Lafayette Dragon, MD;  Location[facility] ENDOSCOPY;  Service: Endoscopy;  Laterality: N/A;   CORONARY ANGIOPLASTY WITH  STENT PLACEMENT     "I have 9 stents"   ESOPHAGOGASTRODUODENOSCOPY N/A 10/19/2014   Procedure: ESOPHAGOGASTRODUODENOSCOPY (EGD);  Surgeon: Jerene Bears, MD;  Location[facility] ENDOSCOPY;  Service: Endoscopy;  Laterality: N/A;   FRACTURE SURGERY     FUSION OF TALONAVICULAR JOINT Right 04/15/2014   dr duda   HERNIA REPAIR     umbilical   INGUINAL HERNIA REPAIR Right 05/11/2015   Procedure: LAPAROSCOPIC REPAIR RIGHT  INGUINAL HERNIA;  Surgeon: Greer Pickerel, MD;  Location: [facility];  Service: General;  Laterality: Right;   INSERTION OF MESH Right 05/11/2015   Procedure: INSERTION OF MESH;  Surgeon: Greer Pickerel, MD;  Location: [facility];  Service: General;  Laterality: Right;   JOINT REPLACEMENT     KNEE CARTILAGE SURGERY Right X 12   "~ 1/2 open; ~ 1/2 scopes"   KNEE CARTILAGE SURGERY Left X 3   "3 scopes"   LEFT HEART CATHETERIZATION WITH CORONARY ANGIOGRAM N/A 02/10/2013   Procedure: LEFT HEART CATHETERIZATION WITH CORONARY ANGIOGRAM;  Surgeon: Burnell Blanks, MD;  [facility] CATH LAB;  Service: Cardiovascular;  Laterality: N/A;   LUMBAR LAMINECTOMY N/A 07/16/2018   Procedure: LEFT L4-5 REDO PARTIAL LUMBAR HEMILAMINECTOMY WITH FORAMINOTOMY LEFT L4;  Surgeon: Jessy Oto, MD;  Location: [facility];  Service: Orthopedics;  Laterality: N/A;   LUMBAR LAMINECTOMY/DECOMPRESSION MICRODISCECTOMY N/A 01/27/2014   Procedure: CENTRAL LUMBAR LAMINECTOMY L4-5 AND L3-4;  Surgeon: Jessy Oto, MD;  Location: [facility];  Service: Orthopedics;  Laterality: N/A;   ORIF ANKLE FRACTURE Right 09/02/2015   Procedure: OPEN REDUCTION INTERNAL FIXATION (ORIF) ANKLE FRACTURE;  Surgeon: Newt Minion, MD;  Location: [facility];  Service: Orthopedics;  Laterality: Right;   PERIPHERALLY INSERTED CENTRAL CATHETER INSERTION  09/02/2015   SHOULDER ARTHROSCOPY W/ ROTATOR CUFF REPAIR Bilateral    "3 on the right; 1 on the left"   SKIN SPLIT GRAFT Right 10/01/2015   Procedure: RIGHT ANKLE APPLY SKIN GRAFT SPLIT THICKNESS;   Surgeon: Newt Minion, MD;  Location: [facility];  Service: Orthopedics;  Laterality: Right;   TONSILLECTOMY     TOOTH EXTRACTION     TOTAL HIP ARTHROPLASTY Right 04/17/2018   TOTAL HIP ARTHROPLASTY Right 04/17/2018   Procedure: RIGHT TOTAL HIP ARTHROPLASTY;  Surgeon: Newt Minion, MD;  Location: [place];  Service: Orthopedics;  Laterality: Right;   TOTAL KNEE ARTHROPLASTY Bilateral 5409   UMBILICAL HERNIA REPAIR     UHR   URETHRAL DILATION  X 4   VASECTOMY     WISDOM TOOTH EXTRACTION     Social History   Occupational History   Occupation: disabled since 2006 due to ortho. heart, Air traffic controller: UNEMPLOYED   Occupation: part time work as an Multimedia programmer, wrestling, and baseball coach [employer]   Smoking status: Former Smoker    Types: Cigars    Last attempt to quit: 08/28/2010    Years since quitting: 8.0   Smokeless tobacco: Never Used   Tobacco comment: 04/18/2016 "smoked 1 cigar/wk when I did smoke"  Substance and Sexual Activity   Alcohol use: Yes    Alcohol/week: 0.0 standard drinks    Comment: rarely   Drug use: No   Sexual activity: Not Currently

## 2018-08-29 NOTE — Telephone Encounter (Signed)
YOUR CARDIOLOGY TEAM HAS ARRANGED FOR AN E-VISIT FOR YOUR APPOINTMENT - PLEASE REVIEW IMPORTANT INFORMATION BELOW SEVERAL DAYS PRIOR TO YOUR APPOINTMENT  Due to the recent COVID-19 pandemic, we are transitioning in-person office visits to tele-medicine visits in an effort to decrease unnecessary exposure to our patients and staff. Medicare and most insurances are covering these visits without a copay needed. We also encourage you to sign up for MyChart if you have not already done so. You will need a smartphone if possible. For patients that do not have this, we can still complete the visit using a regular telephone but do prefer a smartphone to enable video when possible. You may have a close family member that lives with you that can help. If possible, we also ask that you have a blood pressure cuff and scale at home to measure your blood pressure, heart rate and weight prior to your scheduled appointment. Patients with clinical needs that need an in-person evaluation and testing will still be able to come to the office if absolutely necessary. If you have any questions, feel free to call our office.   [facility] Patient Flow:  1. Patient will receive a text that "Dr. Rhunette Croft sent you a secure message" and a link.  2. Click the link  3. They will need to "consent to receive messages"  4. "Click below to join video call"  5. Allow camera and microphone access  6. Allow Chrome to take and record video  7. Select "join"  8. They should be connected      THE DAY OF YOUR APPOINTMENT  Approximately 15 minutes prior to your scheduled appointment, you will receive a telephone call from one of [facility] team - your caller ID may say "Unknown caller."  Our staff will confirm medications, vital signs for the day and any symptoms you may be experiencing. Please have this information available prior to the time of visit start. It may also be helpful for you to have a pad of paper and pen handy for any  instructions given during your visit.   CONSENT FOR TELE-HEALTH VISIT - PLEASE REVIEW  I hereby voluntarily request, consent and authorize [facility] and its employed or contracted physicians, physician assistants, nurse practitioners or other licensed health care professionals (the Practitioner), to provide me with telemedicine health care services (the "Services") as deemed necessary by the treating Practitioner. I acknowledge and consent to receive the Services by the Practitioner via telemedicine. I understand that the telemedicine visit will involve communicating with the Practitioner through live audiovisual communication technology and the disclosure of certain medical information by electronic transmission. I acknowledge that I have been given the opportunity to request an in-person assessment or other available alternative prior to the telemedicine visit and am voluntarily participating in the telemedicine visit.  I understand that I have the right to withhold or withdraw my consent to the use of telemedicine in the course of my care at any time, without affecting my right to future care or treatment, and that the Practitioner or I may terminate the telemedicine visit at any time. I understand that I have the right to inspect all information obtained and/or recorded in the course of the telemedicine visit and may receive copies of available information for a reasonable fee.  I understand that some of the potential risks of receiving the Services via telemedicine include:  [name] Delay or interruption in medical evaluation due to technological equipment failure or disruption; .  Information transmitted may not be sufficient (e.g. poor resolution of images) to allow for appropriate medical decision making by the Practitioner; and/or  . In rare instances, security protocols could fail, causing a breach of personal health information.  Furthermore, I acknowledge that it is my responsibility to provide  information about my medical history, conditions and care that is complete and accurate to the best of my ability. I acknowledge that Practitioner's advice, recommendations, and/or decision may be based on factors not within their control, such as incomplete or inaccurate data provided by me or distortions of diagnostic images or specimens that may result from electronic transmissions. I understand that the practice of medicine is not an exact science and that Practitioner makes no warranties or guarantees regarding treatment outcomes. I acknowledge that I will receive a copy of this consent concurrently upon execution via email to the email address I last provided but may also request a printed copy by calling the office of [facility].    I understand that my insurance will be billed for this visit.   I have read or had this consent read to me. . I understand the contents of this consent, which adequately explains the benefits and risks of the Services being provided via telemedicine.  . I have been provided ample opportunity to ask questions regarding this consent and the Services and have had my questions answered to my satisfaction. . I give my informed consent for the services to be provided through the use of telemedicine in my medical care  By participating in this telemedicine visit I agree to the above.

## 2018-08-29 NOTE — Patient Instructions (Signed)
Plan: *Avoid frequent bending and stooping  No lifting greater than 10 lbs. May use ice or moist heat for pain. Weight loss is of benefit. Bone scan ordered to assess right upper thoracic area for fracture, occult tumor or infection.  Repeat lab, CPR, CBC, Sed rate.  Exercise is important to improve your indurance and does allow people to function better inspite of back pain.

## 2018-08-29 NOTE — Telephone Encounter (Signed)
Pt called stated that his insurance will no longer cover his flexeril and wants to know if this can be change to some else.

## 2018-08-30 DIAGNOSIS — R6889 Other general symptoms and signs: Secondary | ICD-10-CM | POA: Diagnosis not present

## 2018-08-30 DIAGNOSIS — G4733 Obstructive sleep apnea (adult) (pediatric): Secondary | ICD-10-CM | POA: Diagnosis not present

## 2018-09-02 ENCOUNTER — Ambulatory Visit: Payer: Medicare HMO | Admitting: Psychology

## 2018-09-02 DIAGNOSIS — E1151 Type 2 diabetes mellitus with diabetic peripheral angiopathy without gangrene: Secondary | ICD-10-CM | POA: Diagnosis not present

## 2018-09-02 DIAGNOSIS — N3000 Acute cystitis without hematuria: Secondary | ICD-10-CM | POA: Diagnosis not present

## 2018-09-02 DIAGNOSIS — M4802 Spinal stenosis, cervical region: Secondary | ICD-10-CM | POA: Diagnosis not present

## 2018-09-02 DIAGNOSIS — I110 Hypertensive heart disease with heart failure: Secondary | ICD-10-CM | POA: Diagnosis not present

## 2018-09-02 DIAGNOSIS — I25118 Atherosclerotic heart disease of native coronary artery with other forms of angina pectoris: Secondary | ICD-10-CM | POA: Diagnosis not present

## 2018-09-02 DIAGNOSIS — M4726 Other spondylosis with radiculopathy, lumbar region: Secondary | ICD-10-CM | POA: Diagnosis not present

## 2018-09-02 DIAGNOSIS — M5116 Intervertebral disc disorders with radiculopathy, lumbar region: Secondary | ICD-10-CM | POA: Diagnosis not present

## 2018-09-02 DIAGNOSIS — M48062 Spinal stenosis, lumbar region with neurogenic claudication: Secondary | ICD-10-CM | POA: Diagnosis not present

## 2018-09-02 DIAGNOSIS — I5023 Acute on chronic systolic (congestive) heart failure: Secondary | ICD-10-CM | POA: Diagnosis not present

## 2018-09-02 MED ORDER — METHOCARBAMOL 500 MG PO TABS: 500.0000 mg | ORAL_TABLET | Freq: Three times a day (TID) | ORAL | 2 refills | Status: DC | PRN

## 2018-09-02 NOTE — Telephone Encounter (Signed)
Pt stated that he has already spoke to his insurance company and they will cover Robaxin. Please advise.

## 2018-09-02 NOTE — Telephone Encounter (Signed)
Ok for change, I will do

## 2018-09-02 NOTE — Addendum Note (Signed)
Addended by: [name] on: 09/02/2018 01:33 PM   Modules accepted: Orders

## 2018-09-03 ENCOUNTER — Ambulatory Visit: Payer: Medicare HMO | Admitting: Psychology

## 2018-09-03 ENCOUNTER — Ambulatory Visit (INDEPENDENT_AMBULATORY_CARE_PROVIDER_SITE_OTHER): Payer: Medicare HMO | Admitting: Psychology

## 2018-09-03 DIAGNOSIS — F509 Eating disorder, unspecified: Secondary | ICD-10-CM

## 2018-09-03 DIAGNOSIS — R6889 Other general symptoms and signs: Secondary | ICD-10-CM | POA: Diagnosis not present

## 2018-09-03 DIAGNOSIS — M5440 Lumbago with sciatica, unspecified side: Secondary | ICD-10-CM | POA: Diagnosis not present

## 2018-09-04 LAB — CBC
HCT: 36.3 % — ABNORMAL LOW (ref 38.5–50.0)
Hemoglobin: 11.7 g/dL — ABNORMAL LOW (ref 13.2–17.1)
MCH: 26.7 pg — ABNORMAL LOW (ref 27.0–33.0)
MCHC: 32.2 g/dL (ref 32.0–36.0)
MCV: 82.9 fL (ref 80.0–100.0)
MPV: 9 fL (ref 7.5–12.5)
Platelets: 376 10*3/uL (ref 140–400)
RBC: 4.38 10*6/uL (ref 4.20–5.80)
RDW: 14.2 % (ref 11.0–15.0)
WBC: 4.9 10*3/uL (ref 3.8–10.8)

## 2018-09-04 LAB — SEDIMENTATION RATE: Sed Rate: 22 mm/h — ABNORMAL HIGH (ref 0–20)

## 2018-09-04 LAB — C-REACTIVE PROTEIN: CRP: 7.3 mg/L (ref <8.0)

## 2018-09-04 NOTE — Progress Notes (Signed)
I called and advised his labs were within normal limits

## 2018-09-06 DIAGNOSIS — M5116 Intervertebral disc disorders with radiculopathy, lumbar region: Secondary | ICD-10-CM | POA: Diagnosis not present

## 2018-09-06 DIAGNOSIS — I5023 Acute on chronic systolic (congestive) heart failure: Secondary | ICD-10-CM | POA: Diagnosis not present

## 2018-09-06 DIAGNOSIS — M48062 Spinal stenosis, lumbar region with neurogenic claudication: Secondary | ICD-10-CM | POA: Diagnosis not present

## 2018-09-06 DIAGNOSIS — N3000 Acute cystitis without hematuria: Secondary | ICD-10-CM | POA: Diagnosis not present

## 2018-09-06 DIAGNOSIS — E1151 Type 2 diabetes mellitus with diabetic peripheral angiopathy without gangrene: Secondary | ICD-10-CM | POA: Diagnosis not present

## 2018-09-06 DIAGNOSIS — M4802 Spinal stenosis, cervical region: Secondary | ICD-10-CM | POA: Diagnosis not present

## 2018-09-06 DIAGNOSIS — M4726 Other spondylosis with radiculopathy, lumbar region: Secondary | ICD-10-CM | POA: Diagnosis not present

## 2018-09-06 DIAGNOSIS — I25118 Atherosclerotic heart disease of native coronary artery with other forms of angina pectoris: Secondary | ICD-10-CM | POA: Diagnosis not present

## 2018-09-06 DIAGNOSIS — I110 Hypertensive heart disease with heart failure: Secondary | ICD-10-CM | POA: Diagnosis not present

## 2018-09-09 ENCOUNTER — Encounter: Payer: Self-pay | Admitting: Cardiology

## 2018-09-09 ENCOUNTER — Other Ambulatory Visit: Payer: Self-pay

## 2018-09-09 ENCOUNTER — Telehealth (INDEPENDENT_AMBULATORY_CARE_PROVIDER_SITE_OTHER): Payer: Medicare HMO | Admitting: Cardiology

## 2018-09-09 VITALS — BP 129/61 | HR 64 | Ht 72.0 in | Wt 320.0 lb

## 2018-09-09 DIAGNOSIS — M4726 Other spondylosis with radiculopathy, lumbar region: Secondary | ICD-10-CM | POA: Diagnosis not present

## 2018-09-09 DIAGNOSIS — M5116 Intervertebral disc disorders with radiculopathy, lumbar region: Secondary | ICD-10-CM | POA: Diagnosis not present

## 2018-09-09 DIAGNOSIS — I110 Hypertensive heart disease with heart failure: Secondary | ICD-10-CM | POA: Diagnosis not present

## 2018-09-09 DIAGNOSIS — E1151 Type 2 diabetes mellitus with diabetic peripheral angiopathy without gangrene: Secondary | ICD-10-CM | POA: Diagnosis not present

## 2018-09-09 DIAGNOSIS — I2510 Atherosclerotic heart disease of native coronary artery without angina pectoris: Secondary | ICD-10-CM | POA: Diagnosis not present

## 2018-09-09 DIAGNOSIS — M4802 Spinal stenosis, cervical region: Secondary | ICD-10-CM | POA: Diagnosis not present

## 2018-09-09 DIAGNOSIS — N3000 Acute cystitis without hematuria: Secondary | ICD-10-CM | POA: Diagnosis not present

## 2018-09-09 DIAGNOSIS — M48062 Spinal stenosis, lumbar region with neurogenic claudication: Secondary | ICD-10-CM | POA: Diagnosis not present

## 2018-09-09 DIAGNOSIS — I5023 Acute on chronic systolic (congestive) heart failure: Secondary | ICD-10-CM | POA: Diagnosis not present

## 2018-09-09 DIAGNOSIS — I25118 Atherosclerotic heart disease of native coronary artery with other forms of angina pectoris: Secondary | ICD-10-CM | POA: Diagnosis not present

## 2018-09-09 NOTE — Patient Instructions (Signed)
Medication Instructions:  none If you need a refill on your cardiac medications before your next appointment, please call your pharmacy.   Lab work: none If you have labs (blood work) drawn today and your tests are completely normal, you will receive your results only by: [name] MyChart Message (if you have MyChart) OR . A paper copy in the mail If you have any lab test that is abnormal or we need to change your treatment, we will call you to review the results.  Testing/Procedures: none  Follow-Up: February 2021 with Dr Angelena Form At [facility], you and your health needs are our priority.  As part of our continuing mission to provide you with exceptional heart care, we have created designated Provider Care Teams.  These Care Teams include your primary Cardiologist (physician) and Advanced Practice Providers (APPs -  Physician Assistants and Nurse Practitioners) who all work together to provide you with the care you need, when you need it.  Any Other Special Instructions Will Be Listed Below (If Applicable).

## 2018-09-09 NOTE — Progress Notes (Signed)
Virtual Visit via Video Note   This visit type was conducted due to national recommendations for restrictions regarding the COVID-19 Pandemic (e.g. social distancing) in an effort to limit this patient's exposure and mitigate transmission in our community.  Due to his co-morbid illnesses, this patient is at least at moderate risk for complications without adequate follow up.  This format is felt to be most appropriate for this patient at this time.  All issues noted in this document were discussed and addressed.  A limited physical exam was performed with this format.  Please refer to the patient's chart for his consent to telehealth for [facility].   Evaluation Performed:  Follow-up visit  Date:  09/09/2018   ID:  Travis Levit., DOB May 27, 1950, MRN [id number]  Patient Location: Home Provider Location: Home  PCP:  [name], MD  Cardiologist:  Lauree Chandler, MD  Electrophysiologist:  None   Chief Complaint:  [facility] f/u. F/u for CAD.   History of Present Illness:    68 yo male with history of sleep apnea, chronic pain syndrome, fibromyalgia, CAD s/p multiple prior PCIs, depression and anxiety, HTN, HLD and GERD. He has been followed in our office by Dr. Saunders Revel and previously by Dr. Aundra Dubin. He recently established care w/ Dr. Angelena Form due to Dr. Saunders Revel relocating to [place]. He has been maintained on ASA and Effient 5 mg daily given multiple stents and poor platelet inhibition on Plavix. He had a Taxus DES placed in the mid LAD in 2008, Endeavor DES in the Circumflex in 2008, bare metal stent mid RCA in 2009, Xience DES Circumflex 2009. Last cardiac cath in 2013 with patent stents and non-obstructive CAD. Echo August 2019 with LVEF=55-60%. Grade 1 diastolic dysfunction. No significant valve disease. Nuclear stress test November 2019 with no ischemia. LVEF noted to be 47%. Pt was last seen by Dr. Angelena Form 06/2018 for preoperative risk assessment prior to planned back  sugery followed by bariatric surgery. Pt denied anginal symptoms and was cleared for surgery and cleared to temporarily hold antiplatelets.   He had lumbar surgery on 2/25. He was readmitted to the hospital 3/25. He presented presented with fever, dysuria and hematuria. Fever 101. No leukocytosis. He was tachycardic and hypotensive. Admitted for sepsis of unknown source and started on broad spectrum antibiotics and later found to have a UTI. Antibiotics changed from IV to PO Keflex. While in the ED, he also endorsed CP. Trop 0.07 >>0.09> 0.05. ECG unremarkable, no active chest pain at rest. Dr. Loleta Books discussed with Dr. Tamala Julian from cardiology who reviewed chart, labs, ECGs and recommended no ischemic work-up at that time. Elevated troponin felt to be demand ischemia in the setting of sepsis/ Infection. CT angiogram of the chest was negative for PE. Pt was discharged home and advised to f/u with cardiology as outpatient.   Video visit done via Dox[employer]ity 09/09/18.  Patient reports that he has done well since hospital discharge.  He has not had any recurrent chest pain.  In retrospect, the patient believes that his recent chest pain during hospitalization may have been secondary to a bone spur that he has on his rib cage.  He took some ibuprofen and pain resolved.  No recurrence.  He also denies dyspnea.  No exertional symptoms. He has completed his course of antibiotics.  No recurrent urinary symptoms.  Overall he has felt extremely well since his back surgery.  He reports that this is the first time he has been pain-free  in over 30 years.   Unfortunately due to COVID-19, his elective bariatric surgery has been postponed.  No date has been scheduled.  The patient does not have symptoms concerning for COVID-19 infection (fever, chills, cough, or new shortness of breath).    Past Medical History:  Diagnosis Date   ALLERGIC RHINITIS 06/24/2009   Anxiety 11/12/2011   Adequate for discharge    Arthritis      "all my joints" (09/30/2013)   Arthritis of foot, right, degenerative 04/15/2014   Balance disorder 03/12/2013   Benign neoplasm of cecum    Benign neoplasm of descending colon    CAD (coronary artery disease) 06/24/2009   5 stents placed in 2007     Chronic anticoagulation    Chronic pain syndrome 10/27/2009   of ankle, shoulders, low back.  sciatica.    Closed fracture of right foot 10/17/2014   CORONARY ARTERY DISEASE 06/24/2009   a. s/p multiple PCIs - In 2008 he had a Taxus DES to the mild LAD, Endeavor DES to mid LCX and distal LCX. In January 2009 he had DES to distal LCX, mid LCX and proximal LCX. In November 2009 had BMS x 2 to the mid RCA. Cath 10/2011 with patent stents, noncardiac CP. LHC 01/2013: patent stents (noncardiac CP).   DEGENERATIVE JOINT DISEASE 06/24/2009   Qualifier: Diagnosis of  By: [name], [name]    Depression    Depression with anxiety    Prior suicide attempt   [place], LUMBAR 04/19/2010   ERECTILE DYSFUNCTION, ORGANIC 05/30/2010   Essential hypertension 06/24/2009   Qualifier: Diagnosis of  By: [name], [name]    Fibromyalgia    Fracture dislocation of ankle joint 09/02/2015   Gait disorder 03/12/2013   General weakness 07/14/2014   GERD (gastroesophageal reflux disease) 09/08/2015   Hand joint pain 06/10/2013   Heart murmur    Hepatitis C    History of kidney stones    Hyperlipidemia 07/15/2009   Qualifier: Diagnosis of  By: Aundra Dubin, MD, [place]     HYPERLIPIDEMIA-MIXED 07/15/2009   HYPERTENSION 06/24/2009   Insomnia 10/04/2011   Irregular heart beat    Left hip pain 03/12/2013   Injected under ultrasound guidance on June 24, 2013    Major depression 09/13/2015   Myocardial infarction [facility]) 2008   Non-cardiac chest pain 10/2011, 01/2013   Obesity    Occult blood positive stool 10/17/2014   Open ankle fracture 09/02/2015   OSA (obstructive sleep apnea)    not using CPAP (09/30/2013)   Pain of right thumb 04/03/2013    Pneumonia    PPD positive 04/08/2015   Pre-ulcerative corn or callous 02/06/2013   Rotator cuff tear arthropathy of both shoulders 06/10/2013   History of bilateral shoulder cuff surgery for rotator cuff tears. Reports increase in pain 09/11/2015 during physical therapy of the left shoulder.    SCIATICA, LEFT 04/19/2010   Qualifier: Diagnosis of  By: [name], [name]    Spinal stenosis in cervical region 09/26/2013   Spinal stenosis, lumbar region, with neurogenic claudication 09/26/2013   Type II diabetes mellitus ([place]) 2012   no meds in 09/2014.    Uncontrolled type 2 DM with peripheral circulatory disorder ([place]) 10/04/2013   URETHRAL STRICTURE 06/24/2009   self catheterizes.    Past Surgical History:  Procedure Laterality Date   AMPUTATION Right 06/14/2016   Procedure: AMPUTATION BELOW KNEE;  Surgeon: Newt Minion, MD;  Location: [place];  Service: Orthopedics;  Laterality:  Right;   ANKLE FUSION Right 04/15/2014   Procedure: Right Subtalar, Talonavicular Fusion;  Surgeon: Newt Minion, MD;  Location: [place];  Service: Orthopedics;  Laterality: Right;   ANKLE FUSION Right 04/18/2016   Procedure: Right Ankle Tibiocalcaneal Fusion;  Surgeon: Newt Minion, MD;  Location: [place];  Service: Orthopedics;  Laterality: Right;   ANTERIOR CERVICAL DECOMP/DISCECTOMY FUSION N/A 09/26/2013   Procedure: ANTERIOR CERVICAL DISCECTOMY FUSION C3-4, plate and screw fixation, allograft bone graft;  Surgeon: Jessy Oto, MD;  Location: [place];  Service: Orthopedics;  Laterality: N/A;   BACK SURGERY     BELOW KNEE LEG AMPUTATION Right 06/14/2016   CARDIAC CATHETERIZATION  X 1   CARPAL TUNNEL RELEASE Bilateral    COLONOSCOPY     COLONOSCOPY N/A 10/22/2014   Procedure: COLONOSCOPY;  Surgeon: Lafayette Dragon, MD;  Location: [facility] ENDOSCOPY;  Service: Endoscopy;  Laterality: N/A;   CORONARY ANGIOPLASTY WITH STENT PLACEMENT     "I have 9 stents"   ESOPHAGOGASTRODUODENOSCOPY N/A 10/19/2014   Procedure:  ESOPHAGOGASTRODUODENOSCOPY (EGD);  Surgeon: Jerene Bears, MD;  Location: [facility] ENDOSCOPY;  Service: Endoscopy;  Laterality: N/A;   FRACTURE SURGERY     FUSION OF TALONAVICULAR JOINT Right 04/15/2014   dr duda   HERNIA REPAIR     umbilical   INGUINAL HERNIA REPAIR Right 05/11/2015   Procedure: LAPAROSCOPIC REPAIR RIGHT  INGUINAL HERNIA;  Surgeon: Greer Pickerel, MD;  Location: [place];  Service: General;  Laterality: Right;   INSERTION OF MESH Right 05/11/2015   Procedure: INSERTION OF MESH;  Surgeon: Greer Pickerel, MD;  Location: [place];  Service: General;  Laterality: Right;   JOINT REPLACEMENT     KNEE CARTILAGE SURGERY Right X 12   "~ 1/2 open; ~ 1/2 scopes"   KNEE CARTILAGE SURGERY Left X 3   "3 scopes"   LEFT HEART CATHETERIZATION WITH CORONARY ANGIOGRAM N/A 02/10/2013   Procedure: LEFT HEART CATHETERIZATION WITH CORONARY ANGIOGRAM;  Surgeon: Burnell Blanks, MD;  Location: [facility] CATH LAB;  Service: Cardiovascular;  Laterality: N/A;   LUMBAR LAMINECTOMY N/A 07/16/2018   Procedure: LEFT L4-5 REDO PARTIAL LUMBAR HEMILAMINECTOMY WITH FORAMINOTOMY LEFT L4;  Surgeon: Jessy Oto, MD;  Location: [place];  Service: Orthopedics;  Laterality: N/A;   LUMBAR LAMINECTOMY/DECOMPRESSION MICRODISCECTOMY N/A 01/27/2014   Procedure: CENTRAL LUMBAR LAMINECTOMY L4-5 AND L3-4;  Surgeon: Jessy Oto, MD;  Location: [place];  Service: Orthopedics;  Laterality: N/A;   ORIF ANKLE FRACTURE Right 09/02/2015   Procedure: OPEN REDUCTION INTERNAL FIXATION (ORIF) ANKLE FRACTURE;  Surgeon: Newt Minion, MD;  Location: [place];  Service: Orthopedics;  Laterality: Right;   PERIPHERALLY INSERTED CENTRAL CATHETER INSERTION  09/02/2015   SHOULDER ARTHROSCOPY W/ ROTATOR CUFF REPAIR Bilateral    "3 on the right; 1 on the left"   SKIN SPLIT GRAFT Right 10/01/2015   Procedure: RIGHT ANKLE APPLY SKIN GRAFT SPLIT THICKNESS;  Surgeon: Newt Minion, MD;  Location: [place];  Service: Orthopedics;  Laterality: Right;    TONSILLECTOMY     TOOTH EXTRACTION     TOTAL HIP ARTHROPLASTY Right 04/17/2018   TOTAL HIP ARTHROPLASTY Right 04/17/2018   Procedure: RIGHT TOTAL HIP ARTHROPLASTY;  Surgeon: Newt Minion, MD;  Location: [place];  Service: Orthopedics;  Laterality: Right;   TOTAL KNEE ARTHROPLASTY Bilateral 6644   UMBILICAL HERNIA REPAIR     UHR   URETHRAL DILATION  X 4   VASECTOMY     WISDOM TOOTH EXTRACTION  Current Meds  Medication Sig   amoxicillin (AMOXIL) 500 MG capsule Take 2,000 mg by mouth See admin instructions. Take 2000 mg by mouth 1 hour prior to dental appointment   ARIPiprazole (ABILIFY) 5 MG tablet Take 1 tablet (5 mg total) by mouth at bedtime.   aspirin 81 MG tablet Take 81 mg by mouth at bedtime.    atorvastatin (LIPITOR) 20 MG tablet Take 1 tablet (20 mg total) by mouth daily.   benztropine (COGENTIN) 0.5 MG tablet Take 1 tablet (0.5 mg total) by mouth at bedtime.   clonazePAM (KLONOPIN) 0.5 MG tablet Take 1 tablet (0.5 mg total) by mouth at bedtime.   DULoxetine (CYMBALTA) 30 MG capsule Take 1 capsule (30 mg total) by mouth 2 (two) times daily.   gabapentin (NEURONTIN) 600 MG tablet Take 1 tablet (600 mg total) by mouth 3 (three) times daily.   hydrochlorothiazide (HYDRODIURIL) 25 MG tablet Take 25 mg by mouth at bedtime.    Melatonin 5 MG TABS Take 20 mg by mouth at bedtime.    methocarbamol (ROBAXIN) 500 MG tablet Take 1 tablet (500 mg total) by mouth every 8 (eight) hours as needed for muscle spasms.   mirabegron ER (MYRBETRIQ) 50 MG TB24 tablet Take 50 mg by mouth at bedtime.    Omega-3 Fatty Acids (FISH OIL) 1000 MG CAPS Take 1,000 mg by mouth at bedtime.    oxymetazoline (AFRIN) 0.05 % nasal spray Place 1 spray into both nostrils 2 (two) times daily as needed for congestion.   pantoprazole (PROTONIX) 40 MG tablet TAKE ONE TABLET BY MOUTH EVERY DAY   prasugrel (EFFIENT) 5 MG TABS tablet TAKE ONE TABLET BY MOUTH EVERY DAY   traZODone (DESYREL) 150  MG tablet Take 1 tablet (150 mg total) by mouth at bedtime.     Allergies:   Patient has no known allergies.   Social History   Tobacco Use   Smoking status: Former Smoker    Types: Cigars    Last attempt to quit: 08/28/2010    Years since quitting: 8.0   Smokeless tobacco: Never Used   Tobacco comment: 04/18/2016 "smoked 1 cigar/wk when I did smoke"  Substance Use Topics   Alcohol use: Yes    Alcohol/week: 0.0 standard drinks    Comment: rarely   Drug use: No     Family Hx: The patient's family history includes Breast cancer in his mother; Coronary artery disease in an other family member; Depression in his brother, mother, and another family member; Diabetes in his father; Early death in his maternal grandfather and paternal grandfather; Healthy in his son and son; Heart attack (age of onset: 72) in his maternal grandfather; Heart disease in his father, maternal grandfather, and mother; Hyperlipidemia in his father; Hypertension in his brother, father, mother, and another family member; Prostate cancer in his father; Skin cancer in his father.  ROS:   Please see the history of present illness.     All other systems reviewed and are negative.   Prior CV studies:    Echo 01/09/18: - Left ventricle: The cavity size was normal. Systolic function was normal. The estimated ejection fraction was in the range of 55% to 60%. Wall motion was normal; there were no regional wall motion abnormalities. Doppler parameters are consistent with abnormal left ventricular relaxation (grade 1 diastolic dysfunction). - Aortic valve: Transvalvular velocity was within the normal range. There was no stenosis. There was no regurgitation. - Mitral valve: Transvalvular velocity was within the normal range.  There was no evidence for stenosis. There was trivial regurgitation. - Right ventricle: The cavity size was normal. Wall thickness was normal. Systolic function was  normal. - Tricuspid valve: There was trivial regurgitation. - Pulmonary arteries: Systolic pressure was within the normal range. PA peak pressure: 24 mm Hg (S).  NST 03/2018  Study Highlights    Nuclear stress EF: 47%.  There was no ST segment deviation noted during stress.  No T wave inversion was noted during stress.  This is an intermediate risk study due to reduced systolic function. There is no ischemia.  The left ventricular ejection fraction is mildly decreased (45-54%).       Labs/Other Tests and Data Reviewed:    EKG:  An ECG dated 08/15/18 was personally reviewed today and demonstrated:  NSR, PACs 68 bpm   Recent Labs: 06/03/2018: TSH 1.69 08/15/2018: ALT 19; Magnesium 1.5 08/17/2018: BUN 11; Creatinine, Ser 0.84; Potassium 3.8; Sodium 136 09/03/2018: Hemoglobin 11.7; Platelets 376   Recent Lipid Panel Lab Results  Component Value Date/Time   CHOL 124 06/03/2018 01:33 PM   TRIG 119.0 06/03/2018 01:33 PM   HDL 39.00 (L) 06/03/2018 01:33 PM   CHOLHDL 3 06/03/2018 01:33 PM   LDLCALC 61 06/03/2018 01:33 PM    Wt Readings from Last 3 Encounters:  09/09/18 (!) 320 lb (145.2 kg)  08/29/18 (!) 327 lb (148.3 kg)  08/17/18 (!) 321 lb 8 oz (145.8 kg)     Objective:    Vital Signs:  BP 129/61    Pulse 64    Ht 6' (1.829 m)    Wt (!) 320 lb (145.2 kg)    BMI 43.40 kg/m    VITAL SIGNS:  reviewed GEN:  no acute distress EYES:  sclerae anicteric, EOMI - Extraocular Movements Intact RESPIRATORY:  normal respiratory effort, symmetric expansion CARDIOVASCULAR:  N/A SKIN:  normal color in face MUSCULOSKELETAL:  N/A NEURO:  alert and oriented x 3, no obvious focal deficit  ASSESSMENT & PLAN:    1. CAD without angina: history of multiple PCIs as outlined above. No ischemic chest pain. Will continue ASA and Effient 5 mg daily along with statin.  Not currently on a beta-blocker.  Resting heart rate in the low 60s.  Blood pressure is well controlled.  2. Chronic  diastolic CHF:  Denies dyspnea.  No lower extremity edema.  Blood pressure is well controlled.  Continue HCTZ.   3. HTN: controlled on current regimen. 129/61 by home BP monitor. Pulse rate 64 bpm. No changes today.  4. Recent UTI: completed course of antibiotics. Symptoms resolved.   5. Elevated Troponin: recent trop during admission for UTI sepsis 0.07 >>0.09> 0.05. Felt to be demand ischemia. Trend not c/w ACS.  Chest CT negative for PE. He has not had any recent anginal symptoms.  Low risk nuclear stress test November 2019. No further work-up indicated at this time    COVID-19 Education: The signs and symptoms of COVID-19 were discussed with the patient and how to seek care for testing (follow up with PCP or arrange E-visit).  The importance of social distancing was discussed today.  Time:   Today, I have spent 15 minutes with the patient with telehealth technology discussing the above problems.     Medication Adjustments/Labs and Tests Ordered: Current medicines are reviewed at length with the patient today.  Concerns regarding medicines are outlined above.   Tests Ordered: No orders of the defined types were placed in this encounter.   Medication Changes:  No orders of the defined types were placed in this encounter.   Disposition:  Follow up as planned with Dr. Angelena Form February 2021 for his yearly follow-up.  Signed, Lyda Jester, PA-C  09/09/2018 12:12 PM    [place]

## 2018-09-11 ENCOUNTER — Other Ambulatory Visit (HOSPITAL_COMMUNITY): Payer: Self-pay | Admitting: Psychiatry

## 2018-09-11 ENCOUNTER — Telehealth: Payer: Self-pay | Admitting: Internal Medicine

## 2018-09-11 DIAGNOSIS — F411 Generalized anxiety disorder: Secondary | ICD-10-CM

## 2018-09-11 DIAGNOSIS — F319 Bipolar disorder, unspecified: Secondary | ICD-10-CM

## 2018-09-11 DIAGNOSIS — M5116 Intervertebral disc disorders with radiculopathy, lumbar region: Secondary | ICD-10-CM | POA: Diagnosis not present

## 2018-09-11 DIAGNOSIS — N3000 Acute cystitis without hematuria: Secondary | ICD-10-CM | POA: Diagnosis not present

## 2018-09-11 DIAGNOSIS — M48062 Spinal stenosis, lumbar region with neurogenic claudication: Secondary | ICD-10-CM | POA: Diagnosis not present

## 2018-09-11 DIAGNOSIS — M4802 Spinal stenosis, cervical region: Secondary | ICD-10-CM | POA: Diagnosis not present

## 2018-09-11 DIAGNOSIS — M4726 Other spondylosis with radiculopathy, lumbar region: Secondary | ICD-10-CM | POA: Diagnosis not present

## 2018-09-11 DIAGNOSIS — I25118 Atherosclerotic heart disease of native coronary artery with other forms of angina pectoris: Secondary | ICD-10-CM | POA: Diagnosis not present

## 2018-09-11 DIAGNOSIS — I110 Hypertensive heart disease with heart failure: Secondary | ICD-10-CM | POA: Diagnosis not present

## 2018-09-11 DIAGNOSIS — E1151 Type 2 diabetes mellitus with diabetic peripheral angiopathy without gangrene: Secondary | ICD-10-CM | POA: Diagnosis not present

## 2018-09-11 DIAGNOSIS — I5023 Acute on chronic systolic (congestive) heart failure: Secondary | ICD-10-CM | POA: Diagnosis not present

## 2018-09-11 NOTE — Telephone Encounter (Signed)
Ok for verbals 

## 2018-09-11 NOTE — Telephone Encounter (Signed)
Copied from [place] [id number]. Topic: Quick Communication - Home Health Verbal Orders >> Sep 11, 2018  1:13 PM Pauline Good wrote: Caller/Agency: [facility] Number: [phone] Requesting OT/PT/Skilled Nursing/Social Work/Speech Therapy: OT Frequency: 1w 1 and 2 wk 1

## 2018-09-12 ENCOUNTER — Ambulatory Visit (INDEPENDENT_AMBULATORY_CARE_PROVIDER_SITE_OTHER): Payer: Self-pay

## 2018-09-12 ENCOUNTER — Encounter (INDEPENDENT_AMBULATORY_CARE_PROVIDER_SITE_OTHER): Payer: Self-pay | Admitting: Specialist

## 2018-09-12 ENCOUNTER — Other Ambulatory Visit: Payer: Self-pay

## 2018-09-12 ENCOUNTER — Ambulatory Visit (INDEPENDENT_AMBULATORY_CARE_PROVIDER_SITE_OTHER): Payer: Medicare HMO | Admitting: Specialist

## 2018-09-12 VITALS — BP 155/75 | HR 88 | Ht 72.0 in | Wt 327.0 lb

## 2018-09-12 DIAGNOSIS — M549 Dorsalgia, unspecified: Secondary | ICD-10-CM | POA: Diagnosis not present

## 2018-09-12 DIAGNOSIS — Z6841 Body Mass Index (BMI) 40.0 and over, adult: Secondary | ICD-10-CM

## 2018-09-12 DIAGNOSIS — M12811 Other specific arthropathies, not elsewhere classified, right shoulder: Secondary | ICD-10-CM

## 2018-09-12 DIAGNOSIS — R6889 Other general symptoms and signs: Secondary | ICD-10-CM | POA: Diagnosis not present

## 2018-09-12 DIAGNOSIS — M25562 Pain in left knee: Secondary | ICD-10-CM | POA: Diagnosis not present

## 2018-09-12 DIAGNOSIS — M19011 Primary osteoarthritis, right shoulder: Secondary | ICD-10-CM

## 2018-09-12 DIAGNOSIS — T8484XA Pain due to internal orthopedic prosthetic devices, implants and grafts, initial encounter: Secondary | ICD-10-CM

## 2018-09-12 DIAGNOSIS — R39859 Costovertebral (angle) tenderness, unspecified side: Secondary | ICD-10-CM

## 2018-09-12 DIAGNOSIS — Z96651 Presence of right artificial knee joint: Secondary | ICD-10-CM

## 2018-09-12 MED ORDER — DICLOFENAC SODIUM 1 % TD GEL: 4.0000 g | Freq: Four times a day (QID) | TRANSDERMAL | 3 refills | Status: DC

## 2018-09-12 MED ORDER — CELECOXIB 200 MG PO CAPS: 200.0000 mg | ORAL_CAPSULE | Freq: Two times a day (BID) | ORAL | 3 refills | Status: DC

## 2018-09-12 NOTE — Patient Instructions (Addendum)
  Knee is suffering from treatments are Weight loss, NSIADs like celebrex  and exercise. Straight leg raises and pool exercises are recommented Well padded shoes help. Ice the knee 2-3 times a day 15-20 mins at a time. Celebrex 200 mg BID with meal or snack. Voltaren gel transdermal applied to the left knee TID and QID.   Avoid overhead lifting and overhead use of the arms. Do not lift greater than 10 lbs. Tylenol ES one every 6-8 hours for pain and inflamation. Call if you are having right should pain and need to consider a cortisone injection into the right shoulder. A home exercise program. Referral to Dr. Marlou Sa for consideration of right shoulder reverse shoulder arthroplasty, and possible need for patella implant revision due to lateral patella impingement.   Right costovertebral angle pain, thoracic MRI with edema and soft tissue edema consistent with inflamatory process. Will try a rib belt, obtained from [facility] and prosthetics but instructed to remove for deep breathing exercises and if any respiratory infection symptoms.

## 2018-09-12 NOTE — Telephone Encounter (Signed)
Notified Jim w/MD response.Marland KitchenJohny [name]

## 2018-09-12 NOTE — Progress Notes (Addendum)
Post-Op Visit Note   Patient: Travis RUDZINSKI Sr.           Date of Birth: 1951-04-16           MRN: [id number] Visit Date: 09/12/2018 PCP: [name], MD   Assessment & Plan: 2 months post redo lumbar laminectomy with foramenotomy.  Chief Complaint:  Chief Complaint  Patient presents with  . Lower Back - Follow-up   He is having left anterior knee pain with grating and grinding noises. Cortisone helped. Has a left total knee replacement. The left total knee replacement in [place] about 15 years ago. Recent xrays.  Left knee is weak to straight leg raise on the left. Ibuprofen helps the back and the  The left knee. Requests a prescription for  Ibuprofen.  Visit Diagnoses:  1. Primary osteoarthritis, right shoulder   2. Pain due to total right knee replacement, initial encounter ([place])   3. Body mass index 40.0-44.9, adult ([place])   4. Rotator cuff arthropathy of right shoulder   5. Patellar pain, left   6. Costovertebral angle pain     Plan:Knee is suffering from treatments are Weight loss, NSIADs like celebrex  and exercise. Straight leg raises and pool exercises are recommented Well padded shoes help. Ice the knee 2-3 times a day 15-20 mins at a time. Celebrex 200 mg BID with meal or snack. Voltaren gel transdermal applied to the left knee TID and QID.   Avoid overhead lifting and overhead use of the arms. Do not lift greater than 10 lbs. Tylenol ES one every 6-8 hours for pain and inflamation. Call if you are having right should pain and need to consider a cortisone injection into the right shoulder. A home exercise program. Referral to Dr. Marlou Sa for consideration of right shoulder reverse shoulder arthroplasty, and possible need for patella implant revision due to lateral patella impingement.  Right costovertebral angle pain, thoracic MRI with edema and soft tissue edema consistent with inflamatory process. Will try a rib belt, obtained from [place] and  prosthetics but instructed to remove for deep breathing exercises and if any respiratory infection symptoms.  Follow-Up Instructions: Return in about 2 weeks (around 09/26/2018), or See Dr. Marlou Sa in 2 weeks to consider right reverse shoulder arthroplasty, for See me in 6 weeks..   Orders:  Orders Placed This Encounter  Procedures  . XR Knee Complete 4 Views Left  . XR Shoulder Right   Meds ordered this encounter  Medications  . diclofenac sodium (VOLTAREN) 1 % GEL    Sig: Apply 4 g topically 4 (four) times daily.    Dispense:  4 Tube    Refill:  3  . celecoxib (CELEBREX) 200 MG capsule    Sig: Take 1 capsule (200 mg total) by mouth 2 (two) times daily.    Dispense:  180 capsule    Refill:  3    Imaging: Xr Knee Complete 4 Views Left  Result Date: 09/12/2018 Left total knee replacement, AP and lateral radiographs without abnormality. The tangential P-F radiographs shows lateral patella bone impingment on the lateral condyle of the distal femoral component. This is similar to right side.   Xr Shoulder Right  Result Date: 09/12/2018 AP and lateral and outlet views right shoulder show elevation of the right humeral head on the glenoid with inferior right shoulder humeral head osteophyte, narrowing of the right SAS and osteophytes off the superior and inferior right glenoid rim. The axillary lateral shows mild narrowing  of the glenohumeral joint. Moderate DJD of the AC joint.    PMFS History: Patient Active Problem List   Diagnosis Date Noted  . Other spondylosis with radiculopathy, lumbar region 07/16/2018    Priority: High    Class: Chronic  . Spinal stenosis in cervical region 09/26/2013    Priority: High    Class: Chronic  . Spinal stenosis of lumbar region 09/26/2013    Priority: High    Class: Chronic  . Fever 08/15/2018  . UTI (urinary tract infection) 08/15/2018  . Atypical chest pain 08/15/2018  . Fall at home, initial encounter 08/15/2018  . Chronic anemia 08/15/2018   . Hypokalemia 08/15/2018  . Bowel incontinence 07/25/2018  . Status post lumbar laminectomy 07/16/2018  . Status post THR (total hip replacement) 04/17/2018  . Unilateral primary osteoarthritis, right hip   . Morbid obesity with BMI of 40.0-44.9, adult ([place]) 02/28/2018  . Myocardial infarction ([place]) 02/25/2018  . Coronary artery disease involving native coronary artery of native heart 02/25/2018  . PAD (peripheral artery disease) ([place]) 02/25/2018  . S/P BKA (below knee amputation) unilateral, right ([place]) 02/25/2018  . Sacroiliitis ([place]) 02/25/2018  . SOB (shortness of breath) 01/03/2018  . Acute on chronic heart failure ([place]) 01/03/2018  . Severe right groin pain 12/20/2017  . Morbid obesity ([place]) 10/09/2017  . Exposure to sexually transmitted disease (STD) 07/14/2017  . Low back pain 07/13/2017  . Leukoplakia, tongue 01/26/2017  . Acute sinus infection 10/20/2016  . Hyperglycemia 10/20/2016  . Skin lesion 10/20/2016  . S/P unilateral BKA (below knee amputation), right ([place]) 06/14/2016  . Charcot foot due to diabetes mellitus ([place])   . Charcot's arthropathy associated with type 2 diabetes mellitus ([place]) 04/11/2016  . Preventative health care 11/05/2015  . Major depression 09/13/2015  . S/P TKR (total knee replacement) bilaterally 09/13/2015  . GERD (gastroesophageal reflux disease) 09/08/2015  . S/P laparoscopic hernia repair 05/11/2015  . PPD positive 04/08/2015  . Benign neoplasm of descending colon   . Benign neoplasm of cecum   . AKI (acute kidney injury) ([place]) 10/18/2014  . Acute blood loss anemia   . Chronic anticoagulation   . Occult blood positive stool 10/17/2014  . General weakness 07/14/2014  . Urinary incontinence 07/14/2014  . Headache(784.0) 10/15/2013  . Hand joint pain 06/10/2013  . Rotator cuff tear arthropathy of both shoulders 06/10/2013  . Skin lesion of cheek 05/01/2013  . Pain of right thumb 04/03/2013  . Balance disorder 03/12/2013  . Gait disorder  03/12/2013  . Tremor 03/12/2013  . Left hip pain 03/12/2013  . Pre-ulcerative corn or callous 02/06/2013  . Anxiety 11/12/2011  . OSA (obstructive sleep apnea) 11/07/2011  . Bradycardia 10/20/2011  . Insomnia 10/04/2011  . Obesity 01/12/2011  . ERECTILE DYSFUNCTION, ORGANIC 05/30/2010  . [place] DISEASE, LUMBAR 04/19/2010  . SCIATICA, LEFT 04/19/2010  . Chronic pain syndrome 10/27/2009  . Hyperlipidemia 07/15/2009  . Essential hypertension 06/24/2009  . Coronary artery disease involving native coronary artery of native heart without angina pectoris 06/24/2009  . Allergic rhinitis 06/24/2009  . URETHRAL STRICTURE 06/24/2009  . DEGENERATIVE JOINT DISEASE 06/24/2009  . SHOULDER PAIN, BILATERAL 06/24/2009  . FATIGUE 06/24/2009  . NEPHROLITHIASIS, HX OF 06/24/2009   Past Medical History:  Diagnosis Date  . ALLERGIC RHINITIS 06/24/2009  . Anxiety 11/12/2011   Adequate for discharge   . Arthritis    "all my joints" (09/30/2013)  . Arthritis of foot, right, degenerative 04/15/2014  . Balance disorder 03/12/2013  . Benign neoplasm  of cecum   . Benign neoplasm of descending colon   . CAD (coronary artery disease) 06/24/2009   5 stents placed in 08-08-05    . Chronic anticoagulation   . Chronic pain syndrome 10/27/2009   of ankle, shoulders, low back.  sciatica.   . Closed fracture of right foot 10/17/2014  . CORONARY ARTERY DISEASE 06/24/2009   a. s/p multiple PCIs - In 2006/08/08 he had a Taxus DES to the mild LAD, Endeavor DES to mid LCX and distal LCX. In January 2009 he had DES to distal LCX, mid LCX and proximal LCX. In November 2009 had BMS x 2 to the mid RCA. Cath 10/2011 with patent stents, noncardiac CP. LHC 01/2013: patent stents (noncardiac CP).  . DEGENERATIVE JOINT DISEASE 06/24/2009   Qualifier: Diagnosis of  By: [name], [name]   . Depression   . Depression with anxiety    Prior suicide attempt  . [place] DISEASE, LUMBAR 04/19/2010  . ERECTILE DYSFUNCTION, ORGANIC 05/30/2010  . Essential  hypertension 06/24/2009   Qualifier: Diagnosis of  By: [name], [name]   . Fibromyalgia   . Fracture dislocation of ankle joint 09/02/2015  . Gait disorder 03/12/2013  . General weakness 07/14/2014  . GERD (gastroesophageal reflux disease) 09/08/2015  . Hand joint pain 06/10/2013  . Heart murmur   . Hepatitis C   . History of kidney stones   . Hyperlipidemia 07/15/2009   Qualifier: Diagnosis of  By: Aundra Dubin, MD, [place]    . HYPERLIPIDEMIA-MIXED 07/15/2009  . HYPERTENSION 06/24/2009  . Insomnia 10/04/2011  . Irregular heart beat   . Left hip pain 03/12/2013   Injected under ultrasound guidance on June 24, 2013   . Major depression 09/13/2015  . Myocardial infarction ([place]) 08-Aug-2006  . Non-cardiac chest pain 10/2011, 01/2013  . Obesity   . Occult blood positive stool 10/17/2014  . Open ankle fracture 09/02/2015  . OSA (obstructive sleep apnea)    not using CPAP (09/30/2013)  . Pain of right thumb 04/03/2013  . Pneumonia   . PPD positive 04/08/2015  . Pre-ulcerative corn or callous 02/06/2013  . Rotator cuff tear arthropathy of both shoulders 06/10/2013   History of bilateral shoulder cuff surgery for rotator cuff tears. Reports increase in pain 09/11/2015 during physical therapy of the left shoulder.   [name] SCIATICA, LEFT 04/19/2010   Qualifier: Diagnosis of  By: [name], [name]   . Spinal stenosis in cervical region 09/26/2013  . Spinal stenosis, lumbar region, with neurogenic claudication 09/26/2013  . Type II diabetes mellitus ([place]) 08/08/10   no meds in 09/2014.   [name] Uncontrolled type 2 DM with peripheral circulatory disorder ([place]) 10/04/2013  . URETHRAL STRICTURE 06/24/2009   self catheterizes.     Family History  Problem Relation Age of Onset  . Depression Mother   . Heart disease Mother   . Hypertension Mother   . Breast cancer Mother   . Diabetes Father   . Heart disease Father        CABG  . Hypertension Father   . Hyperlipidemia Father   . Prostate cancer Father   . Skin cancer Father   .  Depression Brother        x 2  . Hypertension Brother        x2  . Healthy Son   . Heart disease Maternal Grandfather   . Early death Maternal Grandfather   . Heart attack Maternal Grandfather Aug 09, 2063  . Early death Paternal  Grandfather   . Coronary artery disease Other   . Hypertension Other   . Depression Other   . Healthy Son     Past Surgical History:  Procedure Laterality Date  . AMPUTATION Right 06/14/2016   Procedure: AMPUTATION BELOW KNEE;  Surgeon: Newt Minion, MD;  Location: [facility];  Service: Orthopedics;  Laterality: Right;  . ANKLE FUSION Right 04/15/2014   Procedure: Right Subtalar, Talonavicular Fusion;  Surgeon: Newt Minion, MD;  Location: [facility];  Service: Orthopedics;  Laterality: Right;  . ANKLE FUSION Right 04/18/2016   Procedure: Right Ankle Tibiocalcaneal Fusion;  Surgeon: Newt Minion, MD;  Location: [facility];  Service: Orthopedics;  Laterality: Right;  . ANTERIOR CERVICAL DECOMP/DISCECTOMY FUSION N/A 09/26/2013   Procedure: ANTERIOR CERVICAL DISCECTOMY FUSION C3-4, plate and screw fixation, allograft bone graft;  Surgeon: Jessy Oto, MD;  Location: [facility];  Service: Orthopedics;  Laterality: N/A;  . BACK SURGERY    . BELOW KNEE LEG AMPUTATION Right 06/14/2016  . CARDIAC CATHETERIZATION  X 1  . CARPAL TUNNEL RELEASE Bilateral   . COLONOSCOPY    . COLONOSCOPY N/A 10/22/2014   Procedure: COLONOSCOPY;  Surgeon: Lafayette Dragon, MD;  [facility] ENDOSCOPY;  Service: Endoscopy;  Laterality: N/A;  . CORONARY ANGIOPLASTY WITH STENT PLACEMENT     "I have 9 stents"  . ESOPHAGOGASTRODUODENOSCOPY N/A 10/19/2014   Procedure: ESOPHAGOGASTRODUODENOSCOPY (EGD);  Surgeon: Jerene Bears, MD;  Location: [facility] ENDOSCOPY;  Service: Endoscopy;  Laterality: N/A;  . FRACTURE SURGERY    . FUSION OF TALONAVICULAR JOINT Right 04/15/2014   dr duda  . HERNIA REPAIR     umbilical  . INGUINAL HERNIA REPAIR Right 05/11/2015   Procedure: LAPAROSCOPIC REPAIR RIGHT  INGUINAL HERNIA;  Surgeon: Greer Pickerel, MD;  Location: [facility];  Service: General;  Laterality: Right;  . INSERTION OF MESH Right 05/11/2015   Procedure: INSERTION OF MESH;  Surgeon: Greer Pickerel, MD;  Location: [facility];  Service: General;  Laterality: Right;  . JOINT REPLACEMENT    . KNEE CARTILAGE SURGERY Right X 12   "~ 1/2 open; ~ 1/2 scopes"  . KNEE CARTILAGE SURGERY Left X 3   "3 scopes"  . LEFT HEART CATHETERIZATION WITH CORONARY ANGIOGRAM N/A 02/10/2013   Procedure: LEFT HEART CATHETERIZATION WITH CORONARY ANGIOGRAM;  Surgeon: Burnell Blanks, MD;  Location: [facility] CATH LAB;  Service: Cardiovascular;  Laterality: N/A;  . LUMBAR LAMINECTOMY N/A 07/16/2018   Procedure: LEFT L4-5 REDO PARTIAL LUMBAR HEMILAMINECTOMY WITH FORAMINOTOMY LEFT L4;  Surgeon: Jessy Oto, MD;  Location: [facility];  Service: Orthopedics;  Laterality: N/A;  . LUMBAR LAMINECTOMY/DECOMPRESSION MICRODISCECTOMY N/A 01/27/2014   Procedure: CENTRAL LUMBAR LAMINECTOMY L4-5 AND L3-4;  Surgeon: Jessy Oto, MD;  Location: [facility];  Service: Orthopedics;  Laterality: N/A;  . ORIF ANKLE FRACTURE Right 09/02/2015   Procedure: OPEN REDUCTION INTERNAL FIXATION (ORIF) ANKLE FRACTURE;  Surgeon: Newt Minion, MD;  Location: [facility];  Service: Orthopedics;  Laterality: Right;  . PERIPHERALLY INSERTED CENTRAL CATHETER INSERTION  09/02/2015  . SHOULDER ARTHROSCOPY W/ ROTATOR CUFF REPAIR Bilateral    "3 on the right; 1 on the left"  . SKIN SPLIT GRAFT Right 10/01/2015   Procedure: RIGHT ANKLE APPLY SKIN GRAFT SPLIT THICKNESS;  Surgeon: Newt Minion, MD;  Location: [facility];  Service: Orthopedics;  Laterality: Right;  . TONSILLECTOMY    . TOOTH EXTRACTION    . TOTAL HIP ARTHROPLASTY Right 04/17/2018  . TOTAL HIP ARTHROPLASTY Right  04/17/2018   Procedure: RIGHT TOTAL HIP ARTHROPLASTY;  Surgeon: Newt Minion, MD;  Location: [facility];  Service: Orthopedics;  Laterality: Right;  . TOTAL KNEE ARTHROPLASTY Bilateral 2008  . UMBILICAL HERNIA REPAIR     UHR  . URETHRAL DILATION  X 4   . VASECTOMY    . WISDOM TOOTH EXTRACTION     Social History   Occupational History  . Occupation: disabled since 2006 due to ortho. heart, psych    Employer: UNEMPLOYED  . Occupation: part time work as an Multimedia programmer, wrestling, and Holiday representative  Tobacco Use  . Smoking status: Former Smoker    Types: Cigars    Last attempt to quit: 08/28/2010    Years since quitting: 8.0  . Smokeless tobacco: Never Used  . Tobacco comment: 04/18/2016 "smoked 1 cigar/wk when I did smoke"  Substance and Sexual Activity  . Alcohol use: Yes    Alcohol/week: 0.0 standard drinks    Comment: rarely  . Drug use: No  . Sexual activity: Not Currently

## 2018-09-16 DIAGNOSIS — N3000 Acute cystitis without hematuria: Secondary | ICD-10-CM | POA: Diagnosis not present

## 2018-09-16 DIAGNOSIS — I5023 Acute on chronic systolic (congestive) heart failure: Secondary | ICD-10-CM | POA: Diagnosis not present

## 2018-09-16 DIAGNOSIS — M48062 Spinal stenosis, lumbar region with neurogenic claudication: Secondary | ICD-10-CM | POA: Diagnosis not present

## 2018-09-16 DIAGNOSIS — M4802 Spinal stenosis, cervical region: Secondary | ICD-10-CM | POA: Diagnosis not present

## 2018-09-16 DIAGNOSIS — I110 Hypertensive heart disease with heart failure: Secondary | ICD-10-CM | POA: Diagnosis not present

## 2018-09-16 DIAGNOSIS — M4726 Other spondylosis with radiculopathy, lumbar region: Secondary | ICD-10-CM | POA: Diagnosis not present

## 2018-09-16 DIAGNOSIS — M5116 Intervertebral disc disorders with radiculopathy, lumbar region: Secondary | ICD-10-CM | POA: Diagnosis not present

## 2018-09-16 DIAGNOSIS — I25118 Atherosclerotic heart disease of native coronary artery with other forms of angina pectoris: Secondary | ICD-10-CM | POA: Diagnosis not present

## 2018-09-16 DIAGNOSIS — E1151 Type 2 diabetes mellitus with diabetic peripheral angiopathy without gangrene: Secondary | ICD-10-CM | POA: Diagnosis not present

## 2018-09-19 ENCOUNTER — Encounter: Payer: Self-pay | Admitting: General Surgery

## 2018-09-23 ENCOUNTER — Telehealth: Payer: Self-pay

## 2018-09-23 ENCOUNTER — Ambulatory Visit: Payer: Medicare HMO | Admitting: Gastroenterology

## 2018-09-23 ENCOUNTER — Ambulatory Visit (INDEPENDENT_AMBULATORY_CARE_PROVIDER_SITE_OTHER): Payer: Medicare HMO | Admitting: Gastroenterology

## 2018-09-23 ENCOUNTER — Other Ambulatory Visit: Payer: Self-pay

## 2018-09-23 VITALS — Ht 73.0 in | Wt 330.0 lb

## 2018-09-23 DIAGNOSIS — R194 Change in bowel habit: Secondary | ICD-10-CM | POA: Diagnosis not present

## 2018-09-23 DIAGNOSIS — I110 Hypertensive heart disease with heart failure: Secondary | ICD-10-CM | POA: Diagnosis not present

## 2018-09-23 DIAGNOSIS — I5023 Acute on chronic systolic (congestive) heart failure: Secondary | ICD-10-CM | POA: Diagnosis not present

## 2018-09-23 DIAGNOSIS — Z7902 Long term (current) use of antithrombotics/antiplatelets: Secondary | ICD-10-CM

## 2018-09-23 DIAGNOSIS — N3000 Acute cystitis without hematuria: Secondary | ICD-10-CM | POA: Diagnosis not present

## 2018-09-23 DIAGNOSIS — Z86010 Personal history of colonic polyps: Secondary | ICD-10-CM

## 2018-09-23 DIAGNOSIS — M4726 Other spondylosis with radiculopathy, lumbar region: Secondary | ICD-10-CM | POA: Diagnosis not present

## 2018-09-23 DIAGNOSIS — M48062 Spinal stenosis, lumbar region with neurogenic claudication: Secondary | ICD-10-CM | POA: Diagnosis not present

## 2018-09-23 DIAGNOSIS — M5116 Intervertebral disc disorders with radiculopathy, lumbar region: Secondary | ICD-10-CM | POA: Diagnosis not present

## 2018-09-23 DIAGNOSIS — D649 Anemia, unspecified: Secondary | ICD-10-CM

## 2018-09-23 DIAGNOSIS — M4802 Spinal stenosis, cervical region: Secondary | ICD-10-CM | POA: Diagnosis not present

## 2018-09-23 DIAGNOSIS — E1151 Type 2 diabetes mellitus with diabetic peripheral angiopathy without gangrene: Secondary | ICD-10-CM | POA: Diagnosis not present

## 2018-09-23 DIAGNOSIS — I25118 Atherosclerotic heart disease of native coronary artery with other forms of angina pectoris: Secondary | ICD-10-CM | POA: Diagnosis not present

## 2018-09-23 NOTE — Telephone Encounter (Signed)
[facility] Pre-operative Risk Assessment     Request for surgical clearance:     Endoscopy Procedure  What type of surgery is being performed?     COLONOSCOPY  When is this surgery scheduled?     10-08-2018  Previsit scheduled for 10-02-18  What type of clearance is required ?   Pharmacy  Are there any medications that need to be held prior to surgery and how long? EFFIENT 5 DAYS  Practice name and name of physician performing surgery?   Dr. Geneva Cellar,   [facility]  What is your office phone and fax number?      Phone- [phone]  Fax- [phone] Contact Lemar Lofty, CMA  Anesthesia type (None, local, MAC, general) ?       MAC    Thank you!!

## 2018-09-23 NOTE — Patient Instructions (Addendum)
If you are age 66 or older, your body mass index should be between 23-30. Your Body mass index is 43.54 kg/m. If this is out of the aforementioned range listed, please consider follow up with your Primary Care Provider.  If you are age 56 or younger, your body mass index should be between 19-25. Your Body mass index is 43.54 kg/m. If this is out of the aformentioned range listed, please consider follow up with your Primary Care Provider.   To help prevent the possible spread of infection to our patients, communities, and staff; we will be implementing the following measures:  As of now we are not allowing any visitors/family members to accompany you to any upcoming appointments with [facility]. If you have any concerns about this please contact our office to discuss prior to the appointment.   Please go to the lab in the basement of our building to have lab work done. Hit "B" for basement when you get on the elevator.  When the doors open the lab is on your left.  We will call you with the results. Thank you.  We have you scheduled for a colonoscopy on Tuesday, 5-19 at 11:00am.  You will need to arrive at 10:00am.  The person who brings you will need to stay and drive you home.   [facility] provides rides to and from procedures and will stay during your procedure. Their number is [phone].  You will be contacted by our office prior to your procedure for directions on holding your Effient.  If you do not hear from our office 1 week prior to your scheduled procedure, please call [phone] to discuss.   You are scheduled for a phone call with a nurse on 10-02-18 at 4:30pm to receive instructions regarding preparing for your procedure on the 19th.    Thank you for entrusting me with your care and for choosing [facility], Dr. [place] Cellar

## 2018-09-23 NOTE — Progress Notes (Signed)
Pt prescreened by Lanny Hurst for 5-4 appt

## 2018-09-23 NOTE — Progress Notes (Signed)
Virtual Visit via Video Note  I connected with Travis Novak Sr. on 09/23/18 at  3:00 PM EDT by a video enabled telemedicine application and verified that I am speaking with the correct person using two identifiers.  I discussed the limitations of evaluation and management by telemedicine and the availability of in person appointments. The patient expressed understanding and agreed to proceed.  THIS ENCOUNTER IS A VIRTUAL VISIT DUE TO COVID-19 - PATIENT WAS NOT SEEN IN THE OFFICE. PATIENT HAS CONSENTED TO VIRTUAL VISIT / TELEMEDICINE VISIT VIA [employer]IMITY APP   Location of patient: home Location of provider: office Name of referring provider: [name] MD Persons participating: myself, patient  HPI :  68 y/o male with a history of CAD on Effient, history of colon adenomas, back pain, referred here for a new patient visit for incontinence by Dr. [name].  He reports when he wakes in the AM, he has a prosthetic leg which takes some time to put on, and has an urge to have a bowel movement and can't get to the bathroom in time and has an accident. He thinks this has been ongoing for 3 months, occurs about 3 days per week. He is typically has a BM once to twice per day. He previously took hydrocodone and he had baseline constipation. He reports loose stools in the AM. He has been taking immodium at night to help prevent the morning episodes, he thinks it has helped to prevent episodes over the past week since her started it. Since being on immodium he has had some worsening constipation, having a BM every other day but able to manage. He has not had an accident in the past week since being on immodium. He has no blood in the stools. He has some cramps in his abdomen when this occurs. Episodes are all in the AM. Prior to 3 months, this had not occurred.   He takes Effient for a history of coronary stenting, he has 9 stents total, last time stent was placed was 12-13 years. He had a nuclear  stress test in November, EF 47%, no ischemic changes. He also takes baby aspirin. He denies any current cardiopulmonary symptoms. He had switched medication from gabapentin to Cymbalta over the past 3 months for chronic pain in his back, although he has had surgery for his back and mostly pain free. He takes hydrocodone once per week at this point, much lower than previous. He thinks bowels started prior to going to on Cymbalta. Last colonoscopy in 2016 by Dr. Olevia Perches, 1cm adenoma removed.  No FH of colon cancer.  He takes protonix 61m daily, no history of heartburn that is bothering him now. He thinks on it for history of chest pain, unclear if related to his heart or not, as well as history of anemia with gastritis on prior EGD, he denies NSAID use.  Hgb 09/03/18 was 11.7.   Colonoscopy 10/22/2014 - 2 polyps removed, largest 1cm in size - adenomas - Dr. BOlevia PerchesEGD 10/19/2014 - erosive gastritis. H pylori testing negative   Past Medical History:  Diagnosis Date   ALLERGIC RHINITIS 06/24/2009   Anxiety 11/12/2011   Adequate for discharge    Arthritis    "all my joints" (09/30/2013)   Arthritis of foot, right, degenerative 04/15/2014   Balance disorder 03/12/2013   Benign neoplasm of cecum    Benign neoplasm of descending colon    CAD (coronary artery disease) 06/24/2009   5 stents placed in 2007  Chronic anticoagulation    Chronic pain syndrome 10/27/2009   of ankle, shoulders, low back.  sciatica.    Closed fracture of right foot 10/17/2014   CORONARY ARTERY DISEASE 06/24/2009   a. s/p multiple PCIs - In 2008 he had a Taxus DES to the mild LAD, Endeavor DES to mid LCX and distal LCX. In January 2009 he had DES to distal LCX, mid LCX and proximal LCX. In November 2009 had BMS x 2 to the mid RCA. Cath 10/2011 with patent stents, noncardiac CP. LHC 01/2013: patent stents (noncardiac CP).   DEGENERATIVE JOINT DISEASE 06/24/2009   Qualifier: Diagnosis of  By: [name], [name]    Depression     Depression with anxiety    Prior suicide attempt   [place], LUMBAR 04/19/2010   ERECTILE DYSFUNCTION, ORGANIC 05/30/2010   Essential hypertension 06/24/2009   Qualifier: Diagnosis of  By: [name], [name]    Fibromyalgia    Fracture dislocation of ankle joint 09/02/2015   Gait disorder 03/12/2013   General weakness 07/14/2014   GERD (gastroesophageal reflux disease) 09/08/2015   Hand joint pain 06/10/2013   Heart murmur    Hepatitis C    History of kidney stones    Hyperlipidemia 07/15/2009   Qualifier: Diagnosis of  By: Aundra Dubin, MD, [place]     HYPERLIPIDEMIA-MIXED 07/15/2009   HYPERTENSION 06/24/2009   Insomnia 10/04/2011   Irregular heart beat    Left hip pain 03/12/2013   Injected under ultrasound guidance on June 24, 2013    Major depression 09/13/2015   Myocardial infarction [facility]) 2008   Non-cardiac chest pain 10/2011, 01/2013   Obesity    Occult blood positive stool 10/17/2014   Open ankle fracture 09/02/2015   OSA (obstructive sleep apnea)    not using CPAP (09/30/2013)   Pain of right thumb 04/03/2013   Pneumonia    PPD positive 04/08/2015   Pre-ulcerative corn or callous 02/06/2013   Rotator cuff tear arthropathy of both shoulders 06/10/2013   History of bilateral shoulder cuff surgery for rotator cuff tears. Reports increase in pain 09/11/2015 during physical therapy of the left shoulder.    SCIATICA, LEFT 04/19/2010   Qualifier: Diagnosis of  By: [name], [name]    Spinal stenosis in cervical region 09/26/2013   Spinal stenosis, lumbar region, with neurogenic claudication 09/26/2013   Type II diabetes mellitus ([place]) 2012   no meds in 09/2014.    Uncontrolled type 2 DM with peripheral circulatory disorder ([place]) 10/04/2013   URETHRAL STRICTURE 06/24/2009   self catheterizes.      Past Surgical History:  Procedure Laterality Date   AMPUTATION Right 06/14/2016   Procedure: AMPUTATION BELOW KNEE;  Surgeon: Newt Minion, MD;  Location: [place];  Service: Orthopedics;  Laterality: Right;   ANKLE FUSION Right 04/15/2014   Procedure: Right Subtalar, Talonavicular Fusion;  Surgeon: Newt Minion, MD;  Location: [place];  Service: Orthopedics;  Laterality: Right;   ANKLE FUSION Right 04/18/2016   Procedure: Right Ankle Tibiocalcaneal Fusion;  Surgeon: Newt Minion, MD;  Location: [place];  Service: Orthopedics;  Laterality: Right;   ANTERIOR CERVICAL DECOMP/DISCECTOMY FUSION N/A 09/26/2013   Procedure: ANTERIOR CERVICAL DISCECTOMY FUSION C3-4, plate and screw fixation, allograft bone graft;  Surgeon: Jessy Oto, MD;  Location: [place];  Service: Orthopedics;  Laterality: N/A;   BACK SURGERY     BELOW KNEE LEG AMPUTATION Right 06/14/2016   CARDIAC CATHETERIZATION  X 1  CARPAL TUNNEL RELEASE Bilateral    COLONOSCOPY     COLONOSCOPY N/A 10/22/2014   Procedure: COLONOSCOPY;  Surgeon: Lafayette Dragon, MD;  Location[facility] ENDOSCOPY;  Service: Endoscopy;  Laterality: N/A;   CORONARY ANGIOPLASTY WITH STENT PLACEMENT     "I have 9 stents"   ESOPHAGOGASTRODUODENOSCOPY N/A 10/19/2014   Procedure: ESOPHAGOGASTRODUODENOSCOPY (EGD);  Surgeon: Jerene Bears, MD;  Location: [facility] ENDOSCOPY;  Service: Endoscopy;  Laterality: N/A;   FRACTURE SURGERY     FUSION OF TALONAVICULAR JOINT Right 04/15/2014   dr duda   HERNIA REPAIR     umbilical   INGUINAL HERNIA REPAIR Right 05/11/2015   Procedure: LAPAROSCOPIC REPAIR RIGHT  INGUINAL HERNIA;  Surgeon: Greer Pickerel, MD;  Location: [place];  Service: General;  Laterality: Right;   INSERTION OF MESH Right 05/11/2015   Procedure: INSERTION OF MESH;  Surgeon: Greer Pickerel, MD;  Location: [place];  Service: General;  Laterality: Right;   JOINT REPLACEMENT     KNEE CARTILAGE SURGERY Right X 12   "~ 1/2 open; ~ 1/2 scopes"   KNEE CARTILAGE SURGERY Left X 3   "3 scopes"   LEFT HEART CATHETERIZATION WITH CORONARY ANGIOGRAM N/A 02/10/2013   Procedure: LEFT HEART CATHETERIZATION WITH CORONARY ANGIOGRAM;   Surgeon: Burnell Blanks, MD;  Location[facility] CATH LAB;  Service: Cardiovascular;  Laterality: N/A;   LUMBAR LAMINECTOMY N/A 07/16/2018   Procedure: LEFT L4-5 REDO PARTIAL LUMBAR HEMILAMINECTOMY WITH FORAMINOTOMY LEFT L4;  Surgeon: Jessy Oto, MD;  Location: [place];  Service: Orthopedics;  Laterality: N/A;   LUMBAR LAMINECTOMY/DECOMPRESSION MICRODISCECTOMY N/A 01/27/2014   Procedure: CENTRAL LUMBAR LAMINECTOMY L4-5 AND L3-4;  Surgeon: Jessy Oto, MD;  Location: [place];  Service: Orthopedics;  Laterality: N/A;   ORIF ANKLE FRACTURE Right 09/02/2015   Procedure: OPEN REDUCTION INTERNAL FIXATION (ORIF) ANKLE FRACTURE;  Surgeon: Newt Minion, MD;  Location: [place];  Service: Orthopedics;  Laterality: Right;   PERIPHERALLY INSERTED CENTRAL CATHETER INSERTION  09/02/2015   SHOULDER ARTHROSCOPY W/ ROTATOR CUFF REPAIR Bilateral    "3 on the right; 1 on the left"   SKIN SPLIT GRAFT Right 10/01/2015   Procedure: RIGHT ANKLE APPLY SKIN GRAFT SPLIT THICKNESS;  Surgeon: Newt Minion, MD;  Location: [place];  Service: Orthopedics;  Laterality: Right;   TONSILLECTOMY     TOOTH EXTRACTION     TOTAL HIP ARTHROPLASTY Right 04/17/2018   TOTAL HIP ARTHROPLASTY Right 04/17/2018   Procedure: RIGHT TOTAL HIP ARTHROPLASTY;  Surgeon: Newt Minion, MD;  Location: [place];  Service: Orthopedics;  Laterality: Right;   TOTAL KNEE ARTHROPLASTY Bilateral 7858   UMBILICAL HERNIA REPAIR     UHR   URETHRAL DILATION  X 4   VASECTOMY     WISDOM TOOTH EXTRACTION     Family History  Problem Relation Age of Onset   Depression Mother    Heart disease Mother    Hypertension Mother    Breast cancer Mother    Diabetes Father    Heart disease Father        CABG   Hypertension Father    Hyperlipidemia Father    Prostate cancer Father    Skin cancer Father    Depression Brother        x 2   Hypertension Brother        x2   Healthy Son    Heart disease Maternal Grandfather    Early  death Maternal Grandfather    Heart attack  Maternal Grandfather 62   Early death Paternal Grandfather    Coronary artery disease Other    Hypertension Other    Depression Other    Healthy Son    Social History   Tobacco Use   Smoking status: Former Smoker    Types: Cigars    Last attempt to quit: 08/28/2010    Years since quitting: 8.0   Smokeless tobacco: Never Used   Tobacco comment: 04/18/2016 "smoked 1 cigar/wk when I did smoke"  Substance Use Topics   Alcohol use: Yes    Alcohol/week: 0.0 standard drinks    Comment: rarely   Drug use: No   Current Outpatient Medications  Medication Sig Dispense Refill   amoxicillin (AMOXIL) 500 MG capsule Take 2,000 mg by mouth See admin instructions. Take 2000 mg by mouth 1 hour prior to dental appointment     ARIPiprazole (ABILIFY) 5 MG tablet Take 1 tablet (5 mg total) by mouth at bedtime. 90 tablet 0   aspirin 81 MG tablet Take 81 mg by mouth at bedtime.      atorvastatin (LIPITOR) 20 MG tablet Take 1 tablet (20 mg total) by mouth daily. 90 tablet 3   benztropine (COGENTIN) 0.5 MG tablet Take 1 tablet (0.5 mg total) by mouth at bedtime. 90 tablet 0   celecoxib (CELEBREX) 200 MG capsule Take 1 capsule (200 mg total) by mouth 2 (two) times daily. 180 capsule 3   clonazePAM (KLONOPIN) 0.5 MG tablet Take 1 tablet (0.5 mg total) by mouth at bedtime. 30 tablet 2   diclofenac sodium (VOLTAREN) 1 % GEL Apply 4 g topically 4 (four) times daily. 4 Tube 3   DULoxetine (CYMBALTA) 30 MG capsule Take 1 capsule (30 mg total) by mouth 2 (two) times daily. 180 capsule 0   gabapentin (NEURONTIN) 600 MG tablet Take 1 tablet (600 mg total) by mouth 3 (three) times daily. 90 tablet 3   hydrochlorothiazide (HYDRODIURIL) 25 MG tablet Take 25 mg by mouth at bedtime.      Melatonin 5 MG TABS Take 20 mg by mouth at bedtime.      methocarbamol (ROBAXIN) 500 MG tablet Take 1 tablet (500 mg total) by mouth every 8 (eight) hours as needed for  muscle spasms. 90 tablet 2   mirabegron ER (MYRBETRIQ) 50 MG TB24 tablet Take 50 mg by mouth at bedtime.      Omega-3 Fatty Acids (FISH OIL) 1000 MG CAPS Take 1,000 mg by mouth at bedtime.      oxymetazoline (AFRIN) 0.05 % nasal spray Place 1 spray into both nostrils 2 (two) times daily as needed for congestion.     pantoprazole (PROTONIX) 40 MG tablet TAKE ONE TABLET BY MOUTH EVERY DAY 90 tablet 1   prasugrel (EFFIENT) 5 MG TABS tablet TAKE ONE TABLET BY MOUTH EVERY DAY 90 tablet 3   traZODone (DESYREL) 150 MG tablet Take 1 tablet (150 mg total) by mouth at bedtime. 90 tablet 0   No current facility-administered medications for this visit.    No Known Allergies   Review of Systems: All systems reviewed and negative except where noted in HPI.    Xr Knee Complete 4 Views Left  Result Date: 09/12/2018 Left total knee replacement, AP and lateral radiographs without abnormality. The tangential P-F radiographs shows lateral patella bone impingment on the lateral condyle of the distal femoral component. This is similar to right side.   Xr Shoulder Right  Result Date: 09/12/2018 AP and lateral and outlet views right  shoulder show elevation of the right humeral head on the glenoid with inferior right shoulder humeral head osteophyte, narrowing of the right SAS and osteophytes off the superior and inferior right glenoid rim. The axillary lateral shows mild narrowing of the glenohumeral joint. Moderate DJD of the AC joint.    Physical Exam: Ht 6' 1"  (1.854 m) Comment: pt provided over the phone   Wt (!) 330 lb (149.7 kg) Comment: pt provided over the phone   BMI 43.54 kg/m  NA  Lab Results  Component Value Date   WBC 4.9 09/03/2018   HGB 11.7 (L) 09/03/2018   HCT 36.3 (L) 09/03/2018   MCV 82.9 09/03/2018   PLT 376 09/03/2018    Lab Results  Component Value Date   CREATININE 0.84 08/17/2018   BUN 11 08/17/2018   NA 136 08/17/2018   K 3.8 08/17/2018   CL 104 08/17/2018   CO2  22 08/17/2018    Lab Results  Component Value Date   ALT 19 08/15/2018   AST 34 08/15/2018   ALKPHOS 60 08/15/2018   BILITOT 1.0 08/15/2018     ASSESSMENT AND PLAN:  68 y/o male here for a new patient evaluation of the following:  Change in bowel habits / history of colon polyps / antiplatelet therapy / anemia - patient with intermittent fecal incontinence in recent months, now that he has come off some of his chronic narcotic. Using immodium prior to bedtime which has actually worked well for him and no episodes over the past week. If that is working for him I would continue it, he needs to be mindful of developing constipation on the regimen and if this happens he needs to back off. Otherwise, he is overdue for a surveillance colonoscopy given he had a 39m adenoma removed in 2019. Given these symptoms and his history of advanced adenoma, I offered him a colonoscopy to further evaluate, will ensure no colitis. I discussed risks / benefits of colonoscopy and anesthesia and he wanted to proceed. In order to remove polyps safely he will need to hold the Effient for 5 days prior to the procedure, but during this time he can continue aspirin. He will need approval from his cardiologist to hold the Effient. Finally, mild chronic anemia noted. History of EGD showing gastritis, I think reasonable to continue protonix in the setting of dual antiplatelet therapy from his perspective. Recommend checking iron levels to see if that is driving his chronic anemia, he agreed, will await that result.   SCarolina Cellar MD [facility]  CC: JBiagio Borg MD

## 2018-09-23 NOTE — Telephone Encounter (Signed)
Please comment on holding Effient

## 2018-09-24 NOTE — Telephone Encounter (Signed)
Called and spoke to pt.  Instructed him to hold Effient starting on 5-14.  He expressed understanding.  Verified previsit on 5-13.

## 2018-09-24 NOTE — Telephone Encounter (Signed)
Please see my office note from February 2020. OK to hold Effient for procedure.   Travis [name]

## 2018-09-24 NOTE — Telephone Encounter (Signed)
   Primary Cardiologist: Lauree Chandler, MD  Chart reviewed as part of pre-operative protocol coverage. Per Dr. Angelena Form, patient may hold aspirin and effient for 5 days prior to his upcoming procedure. These medications should be restarted when cleared to do so by Dr. Havery Moros.   I will route this recommendation to the requesting party via Epic fax function and remove from pre-op pool.  Please call with questions.  Abigail Butts, PA-C 09/24/2018, 7:43 AM

## 2018-09-26 ENCOUNTER — Ambulatory Visit (INDEPENDENT_AMBULATORY_CARE_PROVIDER_SITE_OTHER): Payer: Medicare HMO | Admitting: Orthopedic Surgery

## 2018-09-26 ENCOUNTER — Encounter: Payer: Self-pay | Admitting: Orthopedic Surgery

## 2018-09-26 ENCOUNTER — Other Ambulatory Visit: Payer: Self-pay

## 2018-09-26 DIAGNOSIS — I5023 Acute on chronic systolic (congestive) heart failure: Secondary | ICD-10-CM | POA: Diagnosis not present

## 2018-09-26 DIAGNOSIS — N3000 Acute cystitis without hematuria: Secondary | ICD-10-CM | POA: Diagnosis not present

## 2018-09-26 DIAGNOSIS — M5116 Intervertebral disc disorders with radiculopathy, lumbar region: Secondary | ICD-10-CM | POA: Diagnosis not present

## 2018-09-26 DIAGNOSIS — M48062 Spinal stenosis, lumbar region with neurogenic claudication: Secondary | ICD-10-CM | POA: Diagnosis not present

## 2018-09-26 DIAGNOSIS — R6889 Other general symptoms and signs: Secondary | ICD-10-CM | POA: Diagnosis not present

## 2018-09-26 DIAGNOSIS — M19011 Primary osteoarthritis, right shoulder: Secondary | ICD-10-CM

## 2018-09-26 DIAGNOSIS — I25118 Atherosclerotic heart disease of native coronary artery with other forms of angina pectoris: Secondary | ICD-10-CM | POA: Diagnosis not present

## 2018-09-26 DIAGNOSIS — M4726 Other spondylosis with radiculopathy, lumbar region: Secondary | ICD-10-CM | POA: Diagnosis not present

## 2018-09-26 DIAGNOSIS — I110 Hypertensive heart disease with heart failure: Secondary | ICD-10-CM | POA: Diagnosis not present

## 2018-09-26 DIAGNOSIS — M4802 Spinal stenosis, cervical region: Secondary | ICD-10-CM | POA: Diagnosis not present

## 2018-09-26 DIAGNOSIS — E1151 Type 2 diabetes mellitus with diabetic peripheral angiopathy without gangrene: Secondary | ICD-10-CM | POA: Diagnosis not present

## 2018-09-26 NOTE — Progress Notes (Signed)
Office Visit Note   Patient: Travis DREDGE Sr.           Date of Birth: 1950/09/26           MRN: [id number] Visit Date: 09/26/2018 Requested by: [name], MD [place],  [id number] PCP: [name], MD  Subjective: Chief Complaint  Patient presents with  . Right Shoulder - Pain    HPI: Travis [name] is a patient with right shoulder pain.  Is been going on for 10 years.  He has had 3 rotator cuff surgeries the last one 10 years ago.  After the last surgery he had a very difficult time raising the arm.  He uses a cane in the left hand and he has a prosthetic right leg.  He states that the pain is constant.              ROS: All systems reviewed are negative as they relate to the chief complaint within the history of present illness.  Patient denies  fevers or chills.   Assessment & Plan: Visit Diagnoses:  1. Primary osteoarthritis, right shoulder     Plan: Impression is rotator cuff arthropathy in the right shoulder with glenohumeral arthritis in the left shoulder.  Left shoulder is fairly functional.  The right shoulder has pretty limited abduction and forward flexion.  Rotator cuff strength interestingly to external rotation internal rotation is intact.  Passively I can get him easily to 170 of forward flexion and beyond 90 of abduction.  I am concerned that there may be some deltoid atrophy and injury.  This is based on visual inspection and attempt at manual motor testing.  He does have a very large soft tissue sleeve which makes it difficult to assess whether or not that deltoid is firing.  Plan at this time is non-arthrogram MRI to evaluate deltoid for denervation as well as thin cut CT scan right shoulder for preop planning for possible reverse shoulder replacement.  He does have bariatric surgery pending.  He also has large vertical incision on that right shoulder which potentially could be problematic in terms of exposure.  Follow-Up Instructions:  Return in about 4 weeks (around 10/24/2018).   Orders:  Orders Placed This Encounter  Procedures  . MR SHOULDER RIGHT WO CONTRAST  . CT SHOULDER RIGHT WO CONTRAST   No orders of the defined types were placed in this encounter.     Procedures: No procedures performed   Clinical Data: No additional findings.  Objective: Vital Signs: There were no vitals taken for this visit.  Physical Exam:   Constitutional: Patient appears well-developed HEENT:  Head: Normocephalic Eyes:EOM are normal Neck: Normal range of motion Cardiovascular: Normal rate Pulmonary/chest: Effort normal Neurologic: Patient is alert Skin: Skin is warm Psychiatric: Patient has normal mood and affect    Ortho Exam: Ortho exam demonstrates +2 limited forward flexion abduction only to about 10 degrees on that right side.  External rotation strength is 5 out of 5 on the right internal rotation to subscap strength is about 5 out of 5 on the right as well.  Passively the patient does have forward flexion abduction above 90 degrees easily.  Radial pulses intact and motor sensory function to the hand is intact.  Well-healed surgical incision starting at the axillary crease extending proximally in a straight line is present.  There does appear to be some deltoid atrophy right shoulder versus left.  Specialty Comments:  No specialty  comments available.  Imaging: No results found.   PMFS History: Patient Active Problem List   Diagnosis Date Noted  . Fever 08/15/2018  . UTI (urinary tract infection) 08/15/2018  . Atypical chest pain 08/15/2018  . Fall at home, initial encounter 08/15/2018  . Chronic anemia 08/15/2018  . Hypokalemia 08/15/2018  . Bowel incontinence 07/25/2018  . Other spondylosis with radiculopathy, lumbar region 07/16/2018    Class: Chronic  . Status post lumbar laminectomy 07/16/2018  . Status post THR (total hip replacement) 04/17/2018  . Unilateral primary osteoarthritis, right hip   .  Morbid obesity with BMI of 40.0-44.9, adult ([place]) 02/28/2018  . Myocardial infarction ([place]) 02/25/2018  . Coronary artery disease involving native coronary artery of native heart 02/25/2018  . PAD (peripheral artery disease) ([place]) 02/25/2018  . S/P BKA (below knee amputation) unilateral, right ([place]) 02/25/2018  . Sacroiliitis ([place]) 02/25/2018  . SOB (shortness of breath) 01/03/2018  . Acute on chronic heart failure ([place]) 01/03/2018  . Severe right groin pain 12/20/2017  . Morbid obesity ([place]) 10/09/2017  . Exposure to sexually transmitted disease (STD) 07/14/2017  . Low back pain 07/13/2017  . Leukoplakia, tongue 01/26/2017  . Acute sinus infection 10/20/2016  . Hyperglycemia 10/20/2016  . Skin lesion 10/20/2016  . S/P unilateral BKA (below knee amputation), right ([place]) 06/14/2016  . Charcot foot due to diabetes mellitus ([place])   . Charcot's arthropathy associated with type 2 diabetes mellitus ([place]) 04/11/2016  . Preventative health care 11/05/2015  . Major depression 09/13/2015  . S/P TKR (total knee replacement) bilaterally 09/13/2015  . GERD (gastroesophageal reflux disease) 09/08/2015  . S/P laparoscopic hernia repair 05/11/2015  . PPD positive 04/08/2015  . Benign neoplasm of descending colon   . Benign neoplasm of cecum   . AKI (acute kidney injury) ([place]) 10/18/2014  . Acute blood loss anemia   . Chronic anticoagulation   . Occult blood positive stool 10/17/2014  . General weakness 07/14/2014  . Urinary incontinence 07/14/2014  . Headache(784.0) 10/15/2013  . Spinal stenosis in cervical region 09/26/2013    Class: Chronic  . Spinal stenosis of lumbar region 09/26/2013    Class: Chronic  . Hand joint pain 06/10/2013  . Rotator cuff tear arthropathy of both shoulders 06/10/2013  . Skin lesion of cheek 05/01/2013  . Pain of right thumb 04/03/2013  . Balance disorder 03/12/2013  . Gait disorder 03/12/2013  . Tremor 03/12/2013  . Left hip pain 03/12/2013  .  Pre-ulcerative corn or callous 02/06/2013  . Anxiety 11/12/2011  . OSA (obstructive sleep apnea) 11/07/2011  . Bradycardia 10/20/2011  . Insomnia 10/04/2011  . Obesity 01/12/2011  . ERECTILE DYSFUNCTION, ORGANIC 05/30/2010  . [place] DISEASE, LUMBAR 04/19/2010  . SCIATICA, LEFT 04/19/2010  . Chronic pain syndrome 10/27/2009  . Hyperlipidemia 07/15/2009  . Essential hypertension 06/24/2009  . Coronary artery disease involving native coronary artery of native heart without angina pectoris 06/24/2009  . Allergic rhinitis 06/24/2009  . URETHRAL STRICTURE 06/24/2009  . DEGENERATIVE JOINT DISEASE 06/24/2009  . SHOULDER PAIN, BILATERAL 06/24/2009  . FATIGUE 06/24/2009  . NEPHROLITHIASIS, HX OF 06/24/2009   Past Medical History:  Diagnosis Date  . ALLERGIC RHINITIS 06/24/2009  . Anxiety 11/12/2011   Adequate for discharge   . Arthritis    "all my joints" (09/30/2013)  . Arthritis of foot, right, degenerative 04/15/2014  . Balance disorder 03/12/2013  . Benign neoplasm of cecum   . Benign neoplasm of descending colon   . CAD (coronary artery disease) 06/24/2009  5 stents placed in 2005-07-17    . Chronic anticoagulation   . Chronic pain syndrome 10/27/2009   of ankle, shoulders, low back.  sciatica.   . Closed fracture of right foot 10/17/2014  . CORONARY ARTERY DISEASE 06/24/2009   a. s/p multiple PCIs - In Jul 17, 2006 he had a Taxus DES to the mild LAD, Endeavor DES to mid LCX and distal LCX. In January 2009 he had DES to distal LCX, mid LCX and proximal LCX. In November 2009 had BMS x 2 to the mid RCA. Cath 10/2011 with patent stents, noncardiac CP. LHC 01/2013: patent stents (noncardiac CP).  . DEGENERATIVE JOINT DISEASE 06/24/2009   Qualifier: Diagnosis of  By: [name], [name]   . Depression   . Depression with anxiety    Prior suicide attempt  . [place] DISEASE, LUMBAR 04/19/2010  . ERECTILE DYSFUNCTION, ORGANIC 05/30/2010  . Essential hypertension 06/24/2009   Qualifier: Diagnosis of  By: [name], [name]    . Fibromyalgia   . Fracture dislocation of ankle joint 09/02/2015  . Gait disorder 03/12/2013  . General weakness 07/14/2014  . GERD (gastroesophageal reflux disease) 09/08/2015  . Hand joint pain 06/10/2013  . Heart murmur   . Hepatitis C   . History of kidney stones   . Hyperlipidemia 07/15/2009   Qualifier: Diagnosis of  By: Aundra Dubin, MD, [place]    . HYPERLIPIDEMIA-MIXED 07/15/2009  . HYPERTENSION 06/24/2009  . Insomnia 10/04/2011  . Irregular heart beat   . Left hip pain 03/12/2013   Injected under ultrasound guidance on June 24, 2013   . Major depression 09/13/2015  . Myocardial infarction ([place]) 07/17/2006  . Non-cardiac chest pain 10/2011, 01/2013  . Obesity   . Occult blood positive stool 10/17/2014  . Open ankle fracture 09/02/2015  . OSA (obstructive sleep apnea)    not using CPAP (09/30/2013)  . Pain of right thumb 04/03/2013  . Pneumonia   . PPD positive 04/08/2015  . Pre-ulcerative corn or callous 02/06/2013  . Rotator cuff tear arthropathy of both shoulders 06/10/2013   History of bilateral shoulder cuff surgery for rotator cuff tears. Reports increase in pain 09/11/2015 during physical therapy of the left shoulder.   [name] SCIATICA, LEFT 04/19/2010   Qualifier: Diagnosis of  By: [name], [name]   . Spinal stenosis in cervical region 09/26/2013  . Spinal stenosis, lumbar region, with neurogenic claudication 09/26/2013  . Type II diabetes mellitus ([place]) 07-17-10   no meds in 09/2014.   [name] Uncontrolled type 2 DM with peripheral circulatory disorder ([place]) 10/04/2013  . URETHRAL STRICTURE 06/24/2009   self catheterizes.     Family History  Problem Relation Age of Onset  . Depression Mother   . Heart disease Mother   . Hypertension Mother   . Breast cancer Mother   . Diabetes Father   . Heart disease Father        CABG  . Hypertension Father   . Hyperlipidemia Father   . Prostate cancer Father   . Skin cancer Father   . Depression Brother        x 2  . Hypertension Brother        x2  .  Healthy Son   . Heart disease Maternal Grandfather   . Early death Maternal Grandfather   . Heart attack Maternal Grandfather Jul 18, 2063  . Early death Paternal Grandfather   . Coronary artery disease Other   . Hypertension Other   . Depression Other   .  Healthy Son     Past Surgical History:  Procedure Laterality Date  . AMPUTATION Right 06/14/2016   Procedure: AMPUTATION BELOW KNEE;  Surgeon: Newt Minion, MD;  Location: [place];  Service: Orthopedics;  Laterality: Right;  . ANKLE FUSION Right 04/15/2014   Procedure: Right Subtalar, Talonavicular Fusion;  Surgeon: Newt Minion, MD;  Location: [facility];  Service: Orthopedics;  Laterality: Right;  . ANKLE FUSION Right 04/18/2016   Procedure: Right Ankle Tibiocalcaneal Fusion;  Surgeon: Newt Minion, MD;  Location: [place];  Service: Orthopedics;  Laterality: Right;  . ANTERIOR CERVICAL DECOMP/DISCECTOMY FUSION N/A 09/26/2013   Procedure: ANTERIOR CERVICAL DISCECTOMY FUSION C3-4, plate and screw fixation, allograft bone graft;  Surgeon: Jessy Oto, MD;  Location: [place];  Service: Orthopedics;  Laterality: N/A;  . BACK SURGERY    . BELOW KNEE LEG AMPUTATION Right 06/14/2016  . CARDIAC CATHETERIZATION  X 1  . CARPAL TUNNEL RELEASE Bilateral   . COLONOSCOPY    . COLONOSCOPY N/A 10/22/2014   Procedure: COLONOSCOPY;  Surgeon: Lafayette Dragon, MD;  [facility] ENDOSCOPY;  Service: Endoscopy;  Laterality: N/A;  . CORONARY ANGIOPLASTY WITH STENT PLACEMENT     "I have 9 stents"  . ESOPHAGOGASTRODUODENOSCOPY N/A 10/19/2014   Procedure: ESOPHAGOGASTRODUODENOSCOPY (EGD);  Surgeon: Jerene Bears, MD;  Location: [facility] ENDOSCOPY;  Service: Endoscopy;  Laterality: N/A;  . FRACTURE SURGERY    . FUSION OF TALONAVICULAR JOINT Right 04/15/2014   dr duda  . HERNIA REPAIR     umbilical  . INGUINAL HERNIA REPAIR Right 05/11/2015   Procedure: LAPAROSCOPIC REPAIR RIGHT  INGUINAL HERNIA;  Surgeon: Greer Pickerel, MD;  Location: [place];  Service: General;  Laterality: Right;  .  INSERTION OF MESH Right 05/11/2015   Procedure: INSERTION OF MESH;  Surgeon: Greer Pickerel, MD;  Location: [place];  Service: General;  Laterality: Right;  . JOINT REPLACEMENT    . KNEE CARTILAGE SURGERY Right X 12   "~ 1/2 open; ~ 1/2 scopes"  . KNEE CARTILAGE SURGERY Left X 3   "3 scopes"  . LEFT HEART CATHETERIZATION WITH CORONARY ANGIOGRAM N/A 02/10/2013   Procedure: LEFT HEART CATHETERIZATION WITH CORONARY ANGIOGRAM;  Surgeon: Burnell Blanks, MD;  Location: [facility] CATH LAB;  Service: Cardiovascular;  Laterality: N/A;  . LUMBAR LAMINECTOMY N/A 07/16/2018   Procedure: LEFT L4-5 REDO PARTIAL LUMBAR HEMILAMINECTOMY WITH FORAMINOTOMY LEFT L4;  Surgeon: Jessy Oto, MD;  Location: [place];  Service: Orthopedics;  Laterality: N/A;  . LUMBAR LAMINECTOMY/DECOMPRESSION MICRODISCECTOMY N/A 01/27/2014   Procedure: CENTRAL LUMBAR LAMINECTOMY L4-5 AND L3-4;  Surgeon: Jessy Oto, MD;  Location: [place];  Service: Orthopedics;  Laterality: N/A;  . ORIF ANKLE FRACTURE Right 09/02/2015   Procedure: OPEN REDUCTION INTERNAL FIXATION (ORIF) ANKLE FRACTURE;  Surgeon: Newt Minion, MD;  Location: [place];  Service: Orthopedics;  Laterality: Right;  . PERIPHERALLY INSERTED CENTRAL CATHETER INSERTION  09/02/2015  . SHOULDER ARTHROSCOPY W/ ROTATOR CUFF REPAIR Bilateral    "3 on the right; 1 on the left"  . SKIN SPLIT GRAFT Right 10/01/2015   Procedure: RIGHT ANKLE APPLY SKIN GRAFT SPLIT THICKNESS;  Surgeon: Newt Minion, MD;  Location: [place];  Service: Orthopedics;  Laterality: Right;  . TONSILLECTOMY    . TOOTH EXTRACTION    . TOTAL HIP ARTHROPLASTY Right 04/17/2018  . TOTAL HIP ARTHROPLASTY Right 04/17/2018   Procedure: RIGHT TOTAL HIP ARTHROPLASTY;  Surgeon: Newt Minion, MD;  Location: [place];  Service: Orthopedics;  Laterality: Right;  . TOTAL KNEE ARTHROPLASTY Bilateral 2008  . UMBILICAL HERNIA REPAIR     UHR  . URETHRAL DILATION  X 4  . VASECTOMY    . WISDOM TOOTH EXTRACTION     Social History    Occupational History  . Occupation: disabled since 2006 due to ortho. heart, psych    Employer: UNEMPLOYED  . Occupation: part time work as an Multimedia programmer, wrestling, and Holiday representative  Tobacco Use  . Smoking status: Former Smoker    Types: Cigars    Last attempt to quit: 08/28/2010    Years since quitting: 8.0  . Smokeless tobacco: Never Used  . Tobacco comment: 04/18/2016 "smoked 1 cigar/wk when I did smoke"  Substance and Sexual Activity  . Alcohol use: Yes    Alcohol/week: 0.0 standard drinks    Comment: rarely  . Drug use: No  . Sexual activity: Not Currently

## 2018-09-27 ENCOUNTER — Telehealth: Payer: Self-pay | Admitting: Specialist

## 2018-09-27 NOTE — Telephone Encounter (Signed)
Travis [name] called for verbal orders for pt for  Home health ot - once a week every other week for eight weeks  Social work consult

## 2018-09-29 DIAGNOSIS — G4733 Obstructive sleep apnea (adult) (pediatric): Secondary | ICD-10-CM | POA: Diagnosis not present

## 2018-09-30 NOTE — Telephone Encounter (Signed)
I called and gave her Verbal ok for OT and social work consult

## 2018-10-01 DIAGNOSIS — M961 Postlaminectomy syndrome, not elsewhere classified: Secondary | ICD-10-CM | POA: Diagnosis not present

## 2018-10-01 DIAGNOSIS — M1611 Unilateral primary osteoarthritis, right hip: Secondary | ICD-10-CM | POA: Diagnosis not present

## 2018-10-01 DIAGNOSIS — M79609 Pain in unspecified limb: Secondary | ICD-10-CM | POA: Diagnosis not present

## 2018-10-01 DIAGNOSIS — T8789 Other complications of amputation stump: Secondary | ICD-10-CM | POA: Diagnosis not present

## 2018-10-03 DIAGNOSIS — M4726 Other spondylosis with radiculopathy, lumbar region: Secondary | ICD-10-CM | POA: Diagnosis not present

## 2018-10-03 DIAGNOSIS — E1151 Type 2 diabetes mellitus with diabetic peripheral angiopathy without gangrene: Secondary | ICD-10-CM | POA: Diagnosis not present

## 2018-10-03 DIAGNOSIS — M5116 Intervertebral disc disorders with radiculopathy, lumbar region: Secondary | ICD-10-CM | POA: Diagnosis not present

## 2018-10-03 DIAGNOSIS — M4802 Spinal stenosis, cervical region: Secondary | ICD-10-CM | POA: Diagnosis not present

## 2018-10-03 DIAGNOSIS — M48062 Spinal stenosis, lumbar region with neurogenic claudication: Secondary | ICD-10-CM | POA: Diagnosis not present

## 2018-10-03 DIAGNOSIS — M961 Postlaminectomy syndrome, not elsewhere classified: Secondary | ICD-10-CM | POA: Diagnosis not present

## 2018-10-03 DIAGNOSIS — I5023 Acute on chronic systolic (congestive) heart failure: Secondary | ICD-10-CM | POA: Diagnosis not present

## 2018-10-03 DIAGNOSIS — I25118 Atherosclerotic heart disease of native coronary artery with other forms of angina pectoris: Secondary | ICD-10-CM | POA: Diagnosis not present

## 2018-10-03 DIAGNOSIS — I110 Hypertensive heart disease with heart failure: Secondary | ICD-10-CM | POA: Diagnosis not present

## 2018-10-07 ENCOUNTER — Telehealth: Payer: Self-pay | Admitting: *Deleted

## 2018-10-07 NOTE — Telephone Encounter (Signed)
Copied from [place] [id number]. Topic: Referral - Question >> Oct 07, 2018  2:40 PM Rayann Heman wrote: Earnest Bailey calling from [facility] [place] [facility] called and stated that she would like referral sent over for weight loss surgery. Please advise fax [phone]

## 2018-10-08 ENCOUNTER — Encounter: Payer: Medicare HMO | Admitting: Gastroenterology

## 2018-10-08 DIAGNOSIS — M48062 Spinal stenosis, lumbar region with neurogenic claudication: Secondary | ICD-10-CM | POA: Diagnosis not present

## 2018-10-08 DIAGNOSIS — M5116 Intervertebral disc disorders with radiculopathy, lumbar region: Secondary | ICD-10-CM | POA: Diagnosis not present

## 2018-10-08 DIAGNOSIS — E1151 Type 2 diabetes mellitus with diabetic peripheral angiopathy without gangrene: Secondary | ICD-10-CM | POA: Diagnosis not present

## 2018-10-08 DIAGNOSIS — I5023 Acute on chronic systolic (congestive) heart failure: Secondary | ICD-10-CM | POA: Diagnosis not present

## 2018-10-08 DIAGNOSIS — I110 Hypertensive heart disease with heart failure: Secondary | ICD-10-CM | POA: Diagnosis not present

## 2018-10-08 DIAGNOSIS — M961 Postlaminectomy syndrome, not elsewhere classified: Secondary | ICD-10-CM | POA: Diagnosis not present

## 2018-10-08 DIAGNOSIS — M4726 Other spondylosis with radiculopathy, lumbar region: Secondary | ICD-10-CM | POA: Diagnosis not present

## 2018-10-08 DIAGNOSIS — I25118 Atherosclerotic heart disease of native coronary artery with other forms of angina pectoris: Secondary | ICD-10-CM | POA: Diagnosis not present

## 2018-10-08 DIAGNOSIS — M4802 Spinal stenosis, cervical region: Secondary | ICD-10-CM | POA: Diagnosis not present

## 2018-10-08 NOTE — Telephone Encounter (Signed)
Ok this is done 

## 2018-10-10 ENCOUNTER — Other Ambulatory Visit: Payer: Self-pay | Admitting: *Deleted

## 2018-10-10 NOTE — Patient Outreach (Signed)
[facility]) Care Management  10/10/2018  Travis GRONAU Sr. July 13, 1950 [id number]   Telephone Screen  Referral Date: 10/09/18 Referral Source: [facility] referral  Referral Reason: Social work needs, falling UTI, [facility] will be discharging soonTamela Oddi [phone] P/[phone] F Insurance: Psychologist, prison and probation services and [employer] attempt # 1 Successful to home number  Patient is able to verify HIPAA Reviewed and addressed referral from [facility]  to [place] with patient  Travis [name] reports [facility] is still visiting He states he was seen last on Monday 10/07/18 by the OT staff. He reports [facility] has been supporting him after his various surgical procedures (right hip replacement, back surgery) with care and home health rehab. [facility] recently started providing services in March 2020. He reports a future shoulder procedure planned   He reports having a pain management provider, Dr Wilford Grist He reports he feels that his health is fair related to not being able to walk without a cane and right BKA prosthesis  Social: Travis [name] reports he is divorced and living presently with his niece and her family in a [place].  He reports he is presently wanting different housing and is interested ina [facility] SW referral for housing  He states his support system includes his brother and his niece and her family. He reports being independent with his care needs except getting assist from his nephew to do his laundry and to  Vacuum his room He reports he cooks. He reports he gets "free" rides to medical appointments via [facility] transportation benefit. He reports he is riding in lift car.  He reports receiving Meals on wheels He does report some loss of interest in doing thing and depression but confirms he is being seen by a psychiatrist and he and Dr Jenny Reichmann have a medical plan He reports concerns with finances. He reports after paying all his bills he  generally has about $100 to live off of for the rest of the month, He reports he is on social security disability     Conditions:  discharged [facility] and [place] on 07/27/18.   Dx s/p left L4-5 redo partial lumbar hemilaminectomy with foraminotomy Left L4 (N/A), Other spondylosis with radiculopathy, lumbar region,  chronic pain syndrome of ankle, shoulders, low back, January 2020 right total hip replacement, allergic rhinitis, anxiety, depression, bipolar 1 disorder, Urinary incontinence, arthritis of right foot with hx of Fx of right food 10/17/2014, balance disorder, CAD with 5 stents placed in 2007, erectile dysfunction, fibromyalgia,  GERD, heart murmur, hepatitis C hx of kidney stones HDL, MI (2008), HTN, "borderline diabetes" A1c 5.2 ), with peripheral circulatory disorder 10/04/2013, OSA, (right hip replacement, back surgery), Hx of UTI, hx of fall (pt stated related to UTI in August 17 2018 admission), hx of PNA obesity   DME cane eyeglasses, CPAP, walker, right BKA prosthesis, elevated toilet seat and a tub bench   Medications: 15 medications He denies concerns with taking medications as prescribed, affording medications, side effects of medications and questions about medications He reports his [employer] coverage and medicaid pays for most of his medications   Appointments:   Nuclear testing & Conference with Dr Louanne Skye, ortho surgeon 10/15/18 See Dr Louanne Skye for 6 review 10/24/18  10/30/18 Amy Lopax NP neurology  11/05/18 shoulder imaging test  9/4/202 Dr Jenny Reichmann Primary MD   Advance directives: He sates he does not have advance directives and is interested in  getting advance directives  Consent: [facility] RN CM reviewed [facility] services with patient. Patient gave verbal consent for services [facility] telephonic RN CM and [facility] SW [facility] RN CM discussed [facility] community RN CM services with Travis [name]. CM discussed consulting with Tamela Oddi (person who referred him) for discussion of possible need for  [facility] community RN CM. Travis [name] voiced understanding as he voiced uncertainty of the need. He reports if he really needed RN CM he would consent to services   Plan: [place] will refer Travis [name] to [facility] SW for assistance with financial resources, housing resources and advance directive resources  [facility] RN CM will consult with [facility] staff member Doris   Pt encouraged to return a call to [facility] RN CM prn  [facility] RN CM sent a successful outreach letter as discussed with [facility] brochure enclosed for review Route not to MDs  Joelene Millin L. Lavina Hamman, RN, BSN, [place] Management Care Coordinator Direct Number [phone] Mobile number [phone]  Main [facility] number [phone] Fax number [phone]

## 2018-10-11 ENCOUNTER — Other Ambulatory Visit: Payer: Self-pay | Admitting: *Deleted

## 2018-10-11 NOTE — Patient Outreach (Signed)
  [facility]) Care Management  10/11/2018  JETTIE LAZARE Sr. [birth year] [id number]   Care coordination  Referral Date: 10/09/18 Referral Source: [facility]  Referral Reason: Social work needs, falling UTI, [facility] will be discharging soonTamela Oddi [phone] [phone] F Insurance: [employer] medicare and medicaid  [facility] RN CM called and spoke with Doris at [place] at home. Doris, SW and RN CM reviewed Mr [employer] voiced needs. Doris discussed that the needs presently are mainly social. Mr Omary is needing primary housing. Doris shared he is living in an environment that is not Therapist, sports with minimal support from the family in the home.  Doris shares the patient lives in the home with his niece, her husband, her teen age son and animals Tamela Oddi had the opportunity to visit the home. She discussed that Mr Viveros shared information about a fall incident in which he fell about 1 am but did not receive assist from family until abut 6 am. Tamela Oddi did assist with a life alert button for Mr Lema. Doris shared the Mr Jelley receives SSI at [phone] and pays a $550 rent to the niece [facility] was providing home health RN, OT and SW The only services remaining is OT and this services will be concluding in few more weeks.  Doris and [facility] RN CM discussed if [facility] resume services with Mr Mcmillon after his upcoming surgery.    THN RN CM updated [facility] SW Marcie Bal about the collaboration with [facility] SW.      [facility] RN CM called Mr [place]  Patient is able to verify HIPAA Reviewed and addressed the purpose of the follow up call with the patient  Consent: [facility] RN CM reviewed [facility] services with patient. Patient gave verbal consent for services. update tin [facility] RN Cm reviewed the call with [facility] SW and [facility] SW. CM discussed only a [facility] SW referral at this time until it is determined that a [facility] RN CM is needed He voiced that he agreed. He states today that he is  considering not completing his upcoming procedure to his right shoulder as this is his dominant side. He states he is concern about completing the procedure and later not being able to use his right side for ADLs He states since starting Celebrex he has had "No more pain"  Plan  [facility] RN CM will close case the [facility] telephonic RN CM encounter at this time  Pt encouraged to return a call to [place] CM prn and especially if further [facility] services are determined to be needed other then [place]. He voiced understanding  Routed note to MDs  Joelene Millin L. Lavina Hamman, RN, BSN, [place] Coordinator Office number [phone] Mobile number [phone]  Main [facility] number [phone] Fax number [phone]

## 2018-10-15 ENCOUNTER — Other Ambulatory Visit: Payer: Self-pay

## 2018-10-15 ENCOUNTER — Ambulatory Visit (HOSPITAL_COMMUNITY): Payer: Medicare HMO | Attending: Specialist

## 2018-10-15 ENCOUNTER — Ambulatory Visit (HOSPITAL_COMMUNITY): Admission: RE | Admit: 2018-10-15 | Payer: Medicare HMO | Source: Ambulatory Visit

## 2018-10-15 NOTE — Patient Outreach (Signed)
[facility]) Care Management  10/15/2018  Travis FETTIG Sr. 09/27/1950 [id number]   Initial outreach to patient regarding social work referral for financial and housing resources as well as assistance with Regulatory affairs officer.  Patient reported that it was not a good time for him to discuss referral and requested call back tomorrow.    Ronn Melena, BSW Social Worker [phone]

## 2018-10-16 ENCOUNTER — Other Ambulatory Visit: Payer: Self-pay

## 2018-10-16 NOTE — Patient Outreach (Signed)
[facility]) Care Management  10/16/2018  Travis PELLERITO Sr. [birth year] [id number]   Successful outreach to patient regarding social work referral for housing resources and assistance with completion of Advance Directives.   Patient currently lives with his niece and her family.  Patient pays $550 per month to rent a room and have access to bathroom and kitchen.  Patient stated that it is a stressful environment and he wishes to relocate. Patient previously attempted to apply for Section 8 housing but said that he does not think the online application went through.  At this time, he does not have Internet access.  BSW attempted to complete online application on his behalf but applications are currently not being accepted due to [place] restrictions.  BSW mailed the following housing resources: Affordable Housing Management properties [employer]  BSW and patient discussed [facility] POA and Living Will. BSW agreed to mail Advance Directive EMMI and Packet.  Will follow up with patient within the next two weeks to ensure receipt of resources and review Advance Directives.   Ronn Melena, BSW Social Worker [phone]

## 2018-10-17 ENCOUNTER — Ambulatory Visit (INDEPENDENT_AMBULATORY_CARE_PROVIDER_SITE_OTHER): Payer: Medicare HMO | Admitting: Psychology

## 2018-10-17 ENCOUNTER — Telehealth: Payer: Self-pay

## 2018-10-17 DIAGNOSIS — F332 Major depressive disorder, recurrent severe without psychotic features: Secondary | ICD-10-CM

## 2018-10-17 DIAGNOSIS — M4726 Other spondylosis with radiculopathy, lumbar region: Secondary | ICD-10-CM | POA: Diagnosis not present

## 2018-10-17 DIAGNOSIS — M4802 Spinal stenosis, cervical region: Secondary | ICD-10-CM | POA: Diagnosis not present

## 2018-10-17 DIAGNOSIS — M961 Postlaminectomy syndrome, not elsewhere classified: Secondary | ICD-10-CM | POA: Diagnosis not present

## 2018-10-17 DIAGNOSIS — M48062 Spinal stenosis, lumbar region with neurogenic claudication: Secondary | ICD-10-CM | POA: Diagnosis not present

## 2018-10-17 DIAGNOSIS — E1151 Type 2 diabetes mellitus with diabetic peripheral angiopathy without gangrene: Secondary | ICD-10-CM | POA: Diagnosis not present

## 2018-10-17 DIAGNOSIS — I5023 Acute on chronic systolic (congestive) heart failure: Secondary | ICD-10-CM | POA: Diagnosis not present

## 2018-10-17 DIAGNOSIS — M5116 Intervertebral disc disorders with radiculopathy, lumbar region: Secondary | ICD-10-CM | POA: Diagnosis not present

## 2018-10-17 DIAGNOSIS — I110 Hypertensive heart disease with heart failure: Secondary | ICD-10-CM | POA: Diagnosis not present

## 2018-10-17 DIAGNOSIS — I25118 Atherosclerotic heart disease of native coronary artery with other forms of angina pectoris: Secondary | ICD-10-CM | POA: Diagnosis not present

## 2018-10-17 NOTE — Telephone Encounter (Signed)
Copied from [place] [id number]. Topic: General - Other >> Oct 17, 2018  2:27 PM Virl Axe D wrote: Reason for CRM: Pt's OT with [facility], stated pt's BP was elevated today 156/85 and pt was symptomatic with some dizziness when standing. Please advise.

## 2018-10-17 NOTE — Telephone Encounter (Signed)
Ok then to continue same tx, as he may need the higher BP for head perfusion (brain blood flow)

## 2018-10-17 NOTE — Telephone Encounter (Signed)
Called Mallory, LVM with details below.

## 2018-10-19 ENCOUNTER — Other Ambulatory Visit (HOSPITAL_COMMUNITY): Payer: Self-pay | Admitting: Psychiatry

## 2018-10-23 ENCOUNTER — Telehealth: Payer: Self-pay

## 2018-10-23 ENCOUNTER — Ambulatory Visit: Payer: Medicare HMO | Admitting: Psychology

## 2018-10-23 NOTE — Telephone Encounter (Signed)
Spoke with the patient and they have given verbal consent to file their insurance and to do a doxy.me visit. E-mail, mobile number and carrier have been confirmed and sent.  E-mail: [email or web address]  Text: [phone] Phill Mutter)

## 2018-10-24 ENCOUNTER — Ambulatory Visit: Payer: Self-pay | Admitting: Specialist

## 2018-10-25 ENCOUNTER — Ambulatory Visit (INDEPENDENT_AMBULATORY_CARE_PROVIDER_SITE_OTHER): Payer: Medicare HMO | Admitting: Psychology

## 2018-10-25 DIAGNOSIS — F332 Major depressive disorder, recurrent severe without psychotic features: Secondary | ICD-10-CM

## 2018-10-28 ENCOUNTER — Other Ambulatory Visit: Payer: Self-pay | Admitting: Internal Medicine

## 2018-10-28 DIAGNOSIS — R6889 Other general symptoms and signs: Secondary | ICD-10-CM | POA: Diagnosis not present

## 2018-10-30 ENCOUNTER — Other Ambulatory Visit: Payer: Self-pay

## 2018-10-30 ENCOUNTER — Ambulatory Visit: Payer: Medicare HMO | Admitting: Family Medicine

## 2018-10-30 DIAGNOSIS — M5116 Intervertebral disc disorders with radiculopathy, lumbar region: Secondary | ICD-10-CM | POA: Diagnosis not present

## 2018-10-30 DIAGNOSIS — M4726 Other spondylosis with radiculopathy, lumbar region: Secondary | ICD-10-CM | POA: Diagnosis not present

## 2018-10-30 DIAGNOSIS — G4733 Obstructive sleep apnea (adult) (pediatric): Secondary | ICD-10-CM | POA: Diagnosis not present

## 2018-10-30 DIAGNOSIS — E1151 Type 2 diabetes mellitus with diabetic peripheral angiopathy without gangrene: Secondary | ICD-10-CM | POA: Diagnosis not present

## 2018-10-30 DIAGNOSIS — I5023 Acute on chronic systolic (congestive) heart failure: Secondary | ICD-10-CM | POA: Diagnosis not present

## 2018-10-30 DIAGNOSIS — M48062 Spinal stenosis, lumbar region with neurogenic claudication: Secondary | ICD-10-CM | POA: Diagnosis not present

## 2018-10-30 DIAGNOSIS — I25118 Atherosclerotic heart disease of native coronary artery with other forms of angina pectoris: Secondary | ICD-10-CM | POA: Diagnosis not present

## 2018-10-30 DIAGNOSIS — I110 Hypertensive heart disease with heart failure: Secondary | ICD-10-CM | POA: Diagnosis not present

## 2018-10-30 DIAGNOSIS — M4802 Spinal stenosis, cervical region: Secondary | ICD-10-CM | POA: Diagnosis not present

## 2018-10-30 DIAGNOSIS — M961 Postlaminectomy syndrome, not elsewhere classified: Secondary | ICD-10-CM | POA: Diagnosis not present

## 2018-10-30 NOTE — Patient Outreach (Signed)
[facility]) Care Management  10/30/2018  KYUSS HALE Sr. 07-Feb-1951 [id number]  Successful follow up call to patient who confirmed receipt of housing resources and Advance Directives that were mailed on 10/16/18.  Patient reported that he has not yet been able to review information.  BSW is closing case but provided him with contact information and encouraged him to call if questions arise when he reviews information.   Ronn Melena, BSW Social Worker [phone]

## 2018-11-01 ENCOUNTER — Other Ambulatory Visit: Payer: Self-pay | Admitting: Internal Medicine

## 2018-11-05 ENCOUNTER — Other Ambulatory Visit: Payer: Medicare HMO

## 2018-11-06 ENCOUNTER — Other Ambulatory Visit (HOSPITAL_COMMUNITY): Payer: Self-pay | Admitting: Psychiatry

## 2018-11-06 DIAGNOSIS — R69 Illness, unspecified: Secondary | ICD-10-CM | POA: Diagnosis not present

## 2018-11-06 DIAGNOSIS — R6889 Other general symptoms and signs: Secondary | ICD-10-CM | POA: Diagnosis not present

## 2018-11-06 DIAGNOSIS — F319 Bipolar disorder, unspecified: Secondary | ICD-10-CM

## 2018-11-07 ENCOUNTER — Ambulatory Visit: Payer: Medicare HMO | Admitting: Psychology

## 2018-11-07 ENCOUNTER — Other Ambulatory Visit: Payer: Self-pay

## 2018-11-07 ENCOUNTER — Ambulatory Visit: Payer: Medicare HMO | Admitting: *Deleted

## 2018-11-07 VITALS — Ht 73.0 in | Wt 330.0 lb

## 2018-11-07 DIAGNOSIS — Z86010 Personal history of colonic polyps: Secondary | ICD-10-CM

## 2018-11-07 MED ORDER — SUPREP BOWEL PREP KIT 17.5-3.13-1.6 GM/177ML PO SOLN: 1.0000 | Freq: Once | ORAL | 0 refills | Status: AC

## 2018-11-07 NOTE — Progress Notes (Signed)
No egg or soy allergy known to patient  No issues with past sedation with any surgeries  or procedures, no intubation problems  No diet pills per patient No home 02 use per patient  Pt is on blood thinners- Effient  per patient - ok to hold x 5 days for colon  Pt denies issues with constipation  No A fib or A flutter  EMMI video sent to pt's e mail  Pt is having care partner issues- I mailed the [place] care paper to him in the packet- he will call if he needs to Cx and RS colon   Pt verified name, DOB, address and insurance during PV today. Pt mailed instruction packet to included paper to complete and mail back to [facility] with addressed and stamped envelope, Emmi video, copy of consent form to read and not return, and instructions. PV completed over the phone. Pt encouraged to call with questions or issues   Pt is aware that care partner will wait in the car during parking lot; if they feel like they will be too hot to wait in the car; they may wait in the lobby.  We want them to wear a mask (we do not have any that we can provide them), practice social distancing, and we will check their temperatures when they get here.  I did remind patient that their care partner needs to stay in the parking lot the entire time. Pt will wear mask into building

## 2018-11-11 ENCOUNTER — Ambulatory Visit (INDEPENDENT_AMBULATORY_CARE_PROVIDER_SITE_OTHER): Payer: Medicare HMO | Admitting: Psychiatry

## 2018-11-11 ENCOUNTER — Other Ambulatory Visit: Payer: Self-pay

## 2018-11-11 ENCOUNTER — Encounter (HOSPITAL_COMMUNITY): Payer: Self-pay | Admitting: Psychiatry

## 2018-11-11 DIAGNOSIS — F411 Generalized anxiety disorder: Secondary | ICD-10-CM

## 2018-11-11 DIAGNOSIS — F319 Bipolar disorder, unspecified: Secondary | ICD-10-CM | POA: Diagnosis not present

## 2018-11-11 MED ORDER — BENZTROPINE MESYLATE 0.5 MG PO TABS: 0.5000 mg | ORAL_TABLET | Freq: Every day | ORAL | 0 refills | Status: DC

## 2018-11-11 MED ORDER — DULOXETINE HCL 30 MG PO CPEP: 30.0000 mg | ORAL_CAPSULE | Freq: Two times a day (BID) | ORAL | 0 refills | Status: DC

## 2018-11-11 MED ORDER — ARIPIPRAZOLE 5 MG PO TABS: 5.0000 mg | ORAL_TABLET | Freq: Every day | ORAL | 0 refills | Status: DC

## 2018-11-11 MED ORDER — TRAZODONE HCL 150 MG PO TABS: 150.0000 mg | ORAL_TABLET | Freq: Every day | ORAL | 0 refills | Status: DC

## 2018-11-11 NOTE — Progress Notes (Signed)
Virtual Visit via Telephone Note  I connected with Travis [name] Sr. on 11/11/18 at 10:40 AM EDT by telephone and verified that I am speaking with the correct person using two identifiers.   I discussed the limitations, risks, security and privacy concerns of performing an evaluation and management service by telephone and the availability of in person appointments. I also discussed with the patient that there may be a patient responsible charge related to this service. The patient expressed understanding and agreed to proceed.   History of Present Illness: Patient was evaluated by phone session.  He is taking his medication as prescribed.  He told that he is much better since he had back to back surgery for his hip and back.  Now he is trying to get bariatric surgery and need a letter for the clearance.  He is taking trazodone, Abilify, Cogentin and Cymbalta and he feels the medicine is working.  He also takes Klonopin prescribed by primary care physician.  He lives by himself but he has family member who helps him if he needed.  He denies any mood swings, irritability, highs and lows or any crying spells.  He denies any feeling of hopelessness or worthlessness.  He is no longer taking any narcotic pain medication but is still on muscle relaxant and gabapentin.  He reported no tremors, rash, itching or shakes.  His appetite is fair.  His weight is unchanged and currently his weight is 330 pounds.  He is in touch with bariatric surgery and seeing nutritionist in order to prepare his bariatric surgery.  He denies drinking or using any illegal substances.  He denies any suicidal thoughts.   Past Psychiatric History:Reviewed Seen psychiatrist in [place] and [place]. H/O severe mood swings,suicidal gesture, anger, hallucination, irritability, poor impulse control and increase spending. Married 5 times. H/O inpatient in 2014 due to overdose on Xanax and alcohol.  DidIOP. Took Effexor, Xanax, Klonopin,  Ambien,Lunesta, Wellbutrin, Ambien,Remeronand Prozac. Abilify started on Abilify from this office.    Psychiatric Specialty Exam: Physical Exam  ROS  There were no vitals taken for this visit.There is no height or weight on file to calculate BMI.  General Appearance: NA  Eye Contact:  NA  Speech:  Slow  Volume:  Normal  Mood:  Euthymic  Affect:  NA  Thought Process:  Goal Directed  Orientation:  Full (Time, Place, and Person)  Thought Content:  Logical  Suicidal Thoughts:  No  Homicidal Thoughts:  No  Memory:  Immediate;   Good Recent;   Good Remote;   Good  Judgement:  Good  Insight:  Good  Psychomotor Activity:  NA  Concentration:  Concentration: Fair and Attention Span: Fair  Recall:  Good  Fund of Knowledge:  Good  Language:  Good  Akathisia:  No  Handed:  Right  AIMS (if indicated):     Assets:  Communication Skills Desire for Improvement Housing Resilience  ADL's:  Intact  Cognition:  WNL  Sleep:   good      Assessment and Plan: Bipolar disorder type I.  Generalized anxiety disorder.  Patient is a stable on his current medication.  He has no tremors, shakes or any EPS.  We talked about bariatric surgery clearance and I recommend to have his bariatric surgery office send [place] forms to be completed.  However we will need patient's consent to fill those forms.  Patient does not want to change medication since it is working well.  I will continue Cymbalta 30  mg twice a day, Cogentin 0.5 mg at bedtime, Abilify 5 mg daily and trazodone 150 mg at bedtime.  Discussed medication side effects and benefits.  Recommended to call us back if is any question or any concern.  Follow-up in 3 months.  Follow Up Instructions:    I discussed the assessment and treatment plan with the patient. The patient was provided an opportunity to ask questions and all were answered. The patient agreed with the plan and demonstrated an understanding of the instructions.   The patient was  advised to call back or seek an in-person evaluation if the symptoms worsen or if the condition fails to improve as anticipated.  I provided 15 minutes of non-face-to-face time during this encounter.   Kathlee Nations, MD

## 2018-11-13 ENCOUNTER — Ambulatory Visit (INDEPENDENT_AMBULATORY_CARE_PROVIDER_SITE_OTHER): Payer: Medicare HMO | Admitting: Family Medicine

## 2018-11-13 ENCOUNTER — Encounter: Payer: Self-pay | Admitting: Family Medicine

## 2018-11-13 ENCOUNTER — Other Ambulatory Visit: Payer: Self-pay

## 2018-11-13 DIAGNOSIS — G4733 Obstructive sleep apnea (adult) (pediatric): Secondary | ICD-10-CM

## 2018-11-13 DIAGNOSIS — Z9989 Dependence on other enabling machines and devices: Secondary | ICD-10-CM

## 2018-11-13 DIAGNOSIS — M961 Postlaminectomy syndrome, not elsewhere classified: Secondary | ICD-10-CM | POA: Diagnosis not present

## 2018-11-13 DIAGNOSIS — M5116 Intervertebral disc disorders with radiculopathy, lumbar region: Secondary | ICD-10-CM | POA: Diagnosis not present

## 2018-11-13 DIAGNOSIS — M4726 Other spondylosis with radiculopathy, lumbar region: Secondary | ICD-10-CM | POA: Diagnosis not present

## 2018-11-13 DIAGNOSIS — I25118 Atherosclerotic heart disease of native coronary artery with other forms of angina pectoris: Secondary | ICD-10-CM | POA: Diagnosis not present

## 2018-11-13 DIAGNOSIS — M48062 Spinal stenosis, lumbar region with neurogenic claudication: Secondary | ICD-10-CM | POA: Diagnosis not present

## 2018-11-13 DIAGNOSIS — E1151 Type 2 diabetes mellitus with diabetic peripheral angiopathy without gangrene: Secondary | ICD-10-CM | POA: Diagnosis not present

## 2018-11-13 DIAGNOSIS — I5023 Acute on chronic systolic (congestive) heart failure: Secondary | ICD-10-CM | POA: Diagnosis not present

## 2018-11-13 DIAGNOSIS — M4802 Spinal stenosis, cervical region: Secondary | ICD-10-CM | POA: Diagnosis not present

## 2018-11-13 DIAGNOSIS — I110 Hypertensive heart disease with heart failure: Secondary | ICD-10-CM | POA: Diagnosis not present

## 2018-11-13 NOTE — Progress Notes (Signed)
PATIENT: Travis MAHANY Sr. DOB: [birth year]  REASON FOR VISIT: follow up HISTORY FROM: patient  Virtual Visit via Telephone Note  I connected with [name] on 11/13/18 at  2:30 PM EDT by telephone and verified that I am speaking with the correct person using two identifiers.   I discussed the limitations, risks, security and privacy concerns of performing an evaluation and management service by telephone and the availability of in person appointments. I also discussed with the patient that there may be a patient responsible charge related to this service. The patient expressed understanding and agreed to proceed.   History of Present Illness:  11/13/18 Travis [name]. is a 68 y.o. male here today for follow up of sleep apnea on CPAP.  Check reports that he is doing very well with CPAP therapy.  He is using his CPAP every night.  Download data dated 10/14/2018 through 11/12/2018 reveals that he is using his machine 30 out of the last 30 days for compliance of 100%.  23 of those days he used his machine greater than 4 hours for compliance of 77%.  Average usage was 6 hours and 48 minutes.  AHI was 13 on 5 to 15 cm of water with an EPR of 3.  There was no significant leak.  He does note that he is sleeping deeper and playing more refreshed in the mornings.   History (copied from Dr Dohmeier's note on 02/25/2018)  Observations/Objective:  Generalized: Well developed, in no acute distress  Mentation: Alert oriented to time, place, history taking. Follows all commands speech and language fluent   Assessment and Plan:  68 y.o. year old male  has a past medical history of ALLERGIC RHINITIS (06/24/2009), Allergy, Anxiety (11/12/2011), Arthritis, Arthritis of foot, right, degenerative (04/15/2014), Balance disorder (03/12/2013), Benign neoplasm of cecum, Benign neoplasm of descending colon, CAD (coronary artery disease) (06/24/2009), Chronic anticoagulation, Chronic pain  syndrome (10/27/2009), Closed fracture of right foot (10/17/2014), CORONARY ARTERY DISEASE (06/24/2009), DEGENERATIVE JOINT DISEASE (06/24/2009), Depression, Depression with anxiety, DISC DISEASE, LUMBAR (04/19/2010), ERECTILE DYSFUNCTION, ORGANIC (05/30/2010), Essential hypertension (06/24/2009), Fibromyalgia, Fracture dislocation of ankle joint (09/02/2015), Gait disorder (03/12/2013), General weakness (07/14/2014), GERD (gastroesophageal reflux disease) (09/08/2015), Hand joint pain (06/10/2013), Heart murmur, Hepatitis C, History of kidney stones, Hyperlipidemia (07/15/2009), HYPERLIPIDEMIA-MIXED (07/15/2009), HYPERTENSION (06/24/2009), Insomnia (10/04/2011), Irregular heart beat, Left hip pain (03/12/2013), Major depression (09/13/2015), Myocardial infarction ([place]) (2008), Non-cardiac chest pain (10/2011, 01/2013), Obesity, Occult blood positive stool (10/17/2014), Open ankle fracture (09/02/2015), OSA (obstructive sleep apnea), Pain of right thumb (04/03/2013), Pneumonia, PPD positive (04/08/2015), Pre-ulcerative corn or callous (02/06/2013), Rotator cuff tear arthropathy of both shoulders (06/10/2013), SCIATICA, LEFT (04/19/2010), Sleep apnea, Spinal stenosis in cervical region (09/26/2013), Spinal stenosis, lumbar region, with neurogenic claudication (09/26/2013), Type II diabetes mellitus ([place]) (2012), Uncontrolled type 2 DM with peripheral circulatory disorder ([place]) (10/04/2013), and URETHRAL STRICTURE (06/24/2009). here with    ICD-10-CM   1. OSA on CPAP  G47.33 For home use only DME continuous positive airway pressure (CPAP)   Z99.89    Child is doing very well on CPAP therapy.  Download report reveals optimal compliance.  Unfortunately AHI remains elevated.  Central apneas were 8.5.  We will adjust minimum pressure to 6 cm of water and maximum pressure to 16 cm of water.  Order will be sent to DME company today.  I have rescheduled him for a 4-week follow-up to assess response to pressure changes.  He verbalizes understanding and  agreement with this  plan.  We will anticipate titration study if needed.  Orders Placed This Encounter  Procedures  . For home use only DME continuous positive airway pressure (CPAP)    Please adjust minimum pressure to 6cmH20, adjust maximum pressure to 16cmH20 with EPR level 3.    Order Specific Question:   Length of Need    Answer:   Lifetime    Order Specific Question:   Patient has OSA or probable OSA    Answer:   Yes    Order Specific Question:   Is the patient currently using CPAP in the home    Answer:   Yes    Order Specific Question:   Settings    Answer:   Other see comments    Order Specific Question:   CPAP supplies needed    Answer:   Mask, headgear, cushions, filters, heated tubing and water chamber    No orders of the defined types were placed in this encounter.    Follow Up Instructions:  I discussed the assessment and treatment plan with the patient. The patient was provided an opportunity to ask questions and all were answered. The patient agreed with the plan and demonstrated an understanding of the instructions.   The patient was advised to call back or seek an in-person evaluation if the symptoms worsen or if the condition fails to improve as anticipated.  I provided 20 minutes of non-face-to-face time during this encounter.  Patient is located at his place of residence during virtual visit.  Provider is located in the office.  Maryelizabeth Kaufmann, CMA helped to facilitate visit.   Debbora Presto, NP

## 2018-11-18 ENCOUNTER — Telehealth: Payer: Self-pay | Admitting: Gastroenterology

## 2018-11-18 NOTE — Telephone Encounter (Signed)
Spoke with patient regarding Covid-19 screening questions Covid-19 Screening Questions  Do you now or have you had a fever in the last 14 days?     No  Do you have any respiratory symptoms of shortness of breath or cough now or in the last 14 days?    No  Do you have any family members or close contacts with diagnosed or suspected Covid-19 in the past 14 days?     No  Have you been tested for Covid-19 and found to be positive?    No  Pt made aware of that care partner may wait in the car or come up to the lobby during the procedure but will need to provide their own mask.

## 2018-11-19 ENCOUNTER — Ambulatory Visit (AMBULATORY_SURGERY_CENTER): Payer: Medicare HMO | Admitting: Gastroenterology

## 2018-11-19 ENCOUNTER — Other Ambulatory Visit: Payer: Self-pay | Admitting: Internal Medicine

## 2018-11-19 ENCOUNTER — Other Ambulatory Visit: Payer: Self-pay

## 2018-11-19 ENCOUNTER — Ambulatory Visit: Payer: Medicare HMO | Admitting: Psychology

## 2018-11-19 ENCOUNTER — Encounter: Payer: Self-pay | Admitting: Gastroenterology

## 2018-11-19 VITALS — BP 140/60 | HR 65 | Temp 99.3°F | Resp 12 | Ht 73.0 in | Wt 330.0 lb

## 2018-11-19 DIAGNOSIS — Z86010 Personal history of colonic polyps: Secondary | ICD-10-CM | POA: Diagnosis not present

## 2018-11-19 DIAGNOSIS — R197 Diarrhea, unspecified: Secondary | ICD-10-CM | POA: Diagnosis not present

## 2018-11-19 DIAGNOSIS — D123 Benign neoplasm of transverse colon: Secondary | ICD-10-CM | POA: Diagnosis not present

## 2018-11-19 DIAGNOSIS — D122 Benign neoplasm of ascending colon: Secondary | ICD-10-CM | POA: Diagnosis not present

## 2018-11-19 DIAGNOSIS — D120 Benign neoplasm of cecum: Secondary | ICD-10-CM

## 2018-11-19 DIAGNOSIS — D125 Benign neoplasm of sigmoid colon: Secondary | ICD-10-CM | POA: Diagnosis not present

## 2018-11-19 HISTORY — PX: COLONOSCOPY: SHX174

## 2018-11-19 MED ORDER — SODIUM CHLORIDE 0.9 % IV SOLN: 500.0000 mL | Freq: Once | INTRAVENOUS | Status: DC

## 2018-11-19 NOTE — Progress Notes (Signed)
A/ox3, pleased with MAC, report to RN 

## 2018-11-19 NOTE — Progress Notes (Signed)
Pt's states no medical or surgical changes since previsit or office visit. 

## 2018-11-19 NOTE — Progress Notes (Signed)
June Bullock took temp and Rica Mote took vitals.

## 2018-11-19 NOTE — Patient Instructions (Signed)
Discharge instructions given. Handout on polyps. Resume previous medications. YOU HAD AN ENDOSCOPIC PROCEDURE TODAY AT [facility]:   Refer to the procedure report that was given to you for any specific questions about what was found during the examination.  If the procedure report does not answer your questions, please call your gastroenterologist to clarify.  If you requested that your care partner not be given the details of your procedure findings, then the procedure report has been included in a sealed envelope for you to review at your convenience later.  YOU SHOULD EXPECT: Some feelings of bloating in the abdomen. Passage of more gas than usual.  Walking can help get rid of the air that was put into your GI tract during the procedure and reduce the bloating. If you had a lower endoscopy (such as a colonoscopy or flexible sigmoidoscopy) you may notice spotting of blood in your stool or on the toilet paper. If you underwent a bowel prep for your procedure, you may not have a normal bowel movement for a few days.  Please Note:  You might notice some irritation and congestion in your nose or some drainage.  This is from the oxygen used during your procedure.  There is no need for concern and it should clear up in a day or so.  SYMPTOMS TO REPORT IMMEDIATELY:   Following lower endoscopy (colonoscopy or flexible sigmoidoscopy):  Excessive amounts of blood in the stool  Significant tenderness or worsening of abdominal pains  Swelling of the abdomen that is new, acute  Fever of 100F or higher   For urgent or emergent issues, a gastroenterologist can be reached at any hour by calling [phone].   DIET:  We do recommend a small meal at first, but then you may proceed to your regular diet.  Drink plenty of fluids but you should avoid alcoholic beverages for 24 hours.  ACTIVITY:  You should plan to take it easy for the rest of today and you should NOT DRIVE or use heavy  machinery until tomorrow (because of the sedation medicines used during the test).    FOLLOW UP: Our staff will call the number listed on your records 48-72 hours following your procedure to check on you and address any questions or concerns that you may have regarding the information given to you following your procedure. If we do not reach you, we will leave a message.  We will attempt to reach you two times.  During this call, we will ask if you have developed any symptoms of COVID 19. If you develop any symptoms (ie: fever, flu-like symptoms, shortness of breath, cough etc.) before then, please call [phone].  If you test positive for Covid 19 in the 2 weeks post procedure, please call and report this information to [place].    If any biopsies were taken you will be contacted by phone or by letter within the next 1-3 weeks.  Please call us at [phone] if you have not heard about the biopsies in 3 weeks.    SIGNATURES/CONFIDENTIALITY: You and/or your care partner have signed paperwork which will be entered into your electronic medical record.  These signatures attest to the fact that that the information above on your After Visit Summary has been reviewed and is understood.  Full responsibility of the confidentiality of this discharge information lies with you and/or your care-partner.

## 2018-11-19 NOTE — Progress Notes (Signed)
Called to room to assist during endoscopic procedure.  Patient ID and intended procedure confirmed with present staff. Received instructions for my participation in the procedure from the performing physician.  

## 2018-11-19 NOTE — Op Note (Addendum)
[facility] Patient Name: Travis [name] Procedure Date: 11/19/2018 2:42 PM MRN: [id number] Endoscopist: Remo Lipps P. Havery Moros , MD Age: 68 Referring MD:  Date of Birth: 05-03-1951 Gender: Male Account #: [id number] Procedure:                Colonoscopy Indications:              High risk colon cancer surveillance: Personal                            history of colonic polyps, change in bowel habits                            noted, persistent loose stools Medicines:                Monitored Anesthesia Care Procedure:                Pre-Anesthesia Assessment:                           - Prior to the procedure, a History and Physical                            was performed, and patient medications and                            allergies were reviewed. The patient's tolerance of                            previous anesthesia was also reviewed. The risks                            and benefits of the procedure and the sedation                            options and risks were discussed with the patient.                            All questions were answered, and informed consent                            was obtained. Prior Anticoagulants: The patient has                            taken Effient (prasugrel), last dose was 5 days                            prior to procedure. ASA Grade Assessment: III - A                            patient with severe systemic disease. After                            reviewing the risks and benefits, the patient was  deemed in satisfactory condition to undergo the                            procedure.                           After obtaining informed consent, the colonoscope                            was passed under direct vision. Throughout the                            procedure, the patient's blood pressure, pulse, and                            oxygen saturations were monitored continuously. The                   Colonoscope was introduced through the anus and                            advanced to the the terminal ileum, with                            identification of the appendiceal orifice and IC                            valve. The colonoscopy was performed without                            difficulty. The patient tolerated the procedure                            well. The quality of the bowel preparation was                            adequate. The terminal ileum, ileocecal valve,                            appendiceal orifice, and rectum were photographed. Scope In: 2:54:41 PM Scope Out: 3:16:24 PM Scope Withdrawal Time: 0 hours 14 minutes 8 seconds  Total Procedure Duration: 0 hours 21 minutes 43 seconds  Findings:                 The perianal and digital rectal examinations were                            normal.                           The terminal ileum appeared normal.                           A 3 mm polyp was found in the cecum. The polyp was  sessile. The polyp was removed with a cold snare.                            Resection and retrieval were complete.                           Two sessile polyps were found in the ascending                            colon. The polyps were 3 mm in size. These polyps                            were removed with a cold snare. Resection and                            retrieval were complete.                           Two sessile polyps were found in the transverse                            colon. The polyps were 3 mm in size. These polyps                            were removed with a cold snare. Resection and                            retrieval were complete.                           A 3 mm polyp was found in the sigmoid colon. The                            polyp was sessile. The polyp was removed with a                            cold snare. Resection and retrieval were complete.                            There was a medium-sized lipoma, in the ascending                            colon.                           Internal hemorrhoids were found during retroflexion.                           The exam was otherwise without abnormality.                           Biopsies for histology were taken with a cold  forceps from the right colon, left colon and                            transverse colon for evaluation of microscopic                            colitis. Complications:            No immediate complications. Estimated blood loss:                            Minimal. Estimated Blood Loss:     Estimated blood loss was minimal. Impression:               - The examined portion of the ileum was normal.                           - One 3 mm polyp in the cecum, removed with a cold                            snare. Resected and retrieved.                           - Two 3 mm polyps in the ascending colon, removed                            with a cold snare. Resected and retrieved.                           - Two 3 mm polyps in the transverse colon, removed                            with a cold snare. Resected and retrieved.                           - One 3 mm polyp in the sigmoid colon, removed with                            a cold snare. Resected and retrieved.                           - Medium-sized lipoma in the ascending colon.                           - Internal hemorrhoids.                           - The examination was otherwise normal.                           - Biopsies were taken with a cold forceps from the                            right colon, left colon and transverse colon for  evaluation of microscopic colitis. Recommendation:           - Patient has a contact number available for                            emergencies. The signs and symptoms of potential                            delayed complications were  discussed with the                            patient. Return to normal activities tomorrow.                            Written discharge instructions were provided to the                            patient.                           - Resume previous diet.                           - Continue present medications.                           - Resume Effient tomorrow                           - Await pathology results. Remo Lipps P. Havery Moros, MD 11/19/2018 3:22:13 PM This report has been signed electronically.

## 2018-11-20 ENCOUNTER — Other Ambulatory Visit (INDEPENDENT_AMBULATORY_CARE_PROVIDER_SITE_OTHER): Payer: Self-pay | Admitting: Specialist

## 2018-11-20 NOTE — Telephone Encounter (Signed)
Diclofenac refill request

## 2018-11-20 NOTE — Telephone Encounter (Signed)
Done erx 

## 2018-11-21 ENCOUNTER — Ambulatory Visit (INDEPENDENT_AMBULATORY_CARE_PROVIDER_SITE_OTHER): Payer: Medicare HMO | Admitting: Psychology

## 2018-11-21 ENCOUNTER — Telehealth: Payer: Self-pay | Admitting: *Deleted

## 2018-11-21 DIAGNOSIS — F332 Major depressive disorder, recurrent severe without psychotic features: Secondary | ICD-10-CM

## 2018-11-21 NOTE — Telephone Encounter (Signed)
  Follow up Call-  Call back number 11/19/2018  Post procedure Call Back phone  # [phone]  Permission to leave phone message Yes  Some recent data might be hidden     Patient questions:  Do you have a fever, pain , or abdominal swelling? No. Pain Score  0 *  Have you tolerated food without any problems? Yes.    Have you been able to return to your normal activities? Yes.    Do you have any questions about your discharge instructions: Diet   No. Medications  No. Follow up visit  No.  Do you have questions or concerns about your Care? No.  Actions: * If pain score is 4 or above: No action needed, pain <4.  1. Have you developed a fever since your procedure? NO  2.   Have you had an respiratory symptoms (SOB or cough) since your procedure? NO  3.   Have you tested positive for COVID 19 since your procedure NO  4.   Have you had any family members/close contacts diagnosed with the COVID 19 since your procedure?  NO   If yes to any of these questions please route to Joylene John, RN and Alphonsa Gin, RN.

## 2018-11-25 ENCOUNTER — Telehealth: Payer: Self-pay | Admitting: Gastroenterology

## 2018-11-25 DIAGNOSIS — R6889 Other general symptoms and signs: Secondary | ICD-10-CM | POA: Diagnosis not present

## 2018-11-25 NOTE — Telephone Encounter (Signed)
Pt states that he has been having diarrhea since last Friday, would like to know what he can take.

## 2018-11-26 ENCOUNTER — Other Ambulatory Visit: Payer: Self-pay

## 2018-11-26 DIAGNOSIS — R194 Change in bowel habit: Secondary | ICD-10-CM

## 2018-11-26 NOTE — Telephone Encounter (Signed)
Thanks for the note Sherlynn Stalls. He has had some intermittent loose stools in recent months. We performed his colonoscopy on 6/30, no inflammation was noted, random biopsies obtained were NORMAL. He did have multiple small adenomas and needs a repeat colonoscopy in 3 years if you can let him know that.  Sounds like this diarrhea has changed since his colonoscopy, let's send a GI pathogen panel to ensure negative, also send fecal pancreatic elastase as well given some chronicity to this. He can stop Pepto is not helping, likely making stool dark, and increase immodium, take 2 tabs in the AM and an addition tab following a bowel movement, can take up to 6 per day if needed. Thanks

## 2018-11-26 NOTE — Progress Notes (Signed)
Gi path

## 2018-11-26 NOTE — Telephone Encounter (Signed)
Order in [place] for GI Pathogen panel PCR and Fecal Pancreatic elastase. Patient called and given Dr. Doyne Keel comments and instructions to stop Pepto and increase Immodium (up to 6 tabs daily) until diarrhea is under control. Patient will come in for stool tests

## 2018-11-26 NOTE — Telephone Encounter (Signed)
Patient states he has had diarrhea since Friday 11/22/18. It is total liquid, black in color, no blood. Having 4-5 a day. No fever, no vomiting/nausea. Has been taking Imodium 2 tabs a day and 4 doses of Pepto Bismol since Friday with no improvement. He is taking in lots of liquid to keep from getting dehydrated. Please advise

## 2018-11-26 NOTE — Progress Notes (Signed)
Gastro intestin

## 2018-11-28 ENCOUNTER — Other Ambulatory Visit: Payer: Medicare HMO

## 2018-11-29 DIAGNOSIS — M1611 Unilateral primary osteoarthritis, right hip: Secondary | ICD-10-CM | POA: Diagnosis not present

## 2018-11-29 DIAGNOSIS — M25511 Pain in right shoulder: Secondary | ICD-10-CM | POA: Diagnosis not present

## 2018-11-29 DIAGNOSIS — G4733 Obstructive sleep apnea (adult) (pediatric): Secondary | ICD-10-CM | POA: Diagnosis not present

## 2018-11-29 DIAGNOSIS — M961 Postlaminectomy syndrome, not elsewhere classified: Secondary | ICD-10-CM | POA: Diagnosis not present

## 2018-12-03 DIAGNOSIS — G4733 Obstructive sleep apnea (adult) (pediatric): Secondary | ICD-10-CM | POA: Diagnosis not present

## 2018-12-04 ENCOUNTER — Other Ambulatory Visit (HOSPITAL_COMMUNITY): Payer: Self-pay | Admitting: Psychiatry

## 2018-12-04 ENCOUNTER — Other Ambulatory Visit: Payer: Self-pay | Admitting: Cardiovascular Disease

## 2018-12-04 DIAGNOSIS — F411 Generalized anxiety disorder: Secondary | ICD-10-CM

## 2018-12-04 DIAGNOSIS — F319 Bipolar disorder, unspecified: Secondary | ICD-10-CM

## 2018-12-04 MED ORDER — PRASUGREL HCL 5 MG PO TABS: 5.0000 mg | ORAL_TABLET | Freq: Every day | ORAL | 2 refills | Status: DC

## 2018-12-10 ENCOUNTER — Other Ambulatory Visit: Payer: Medicare HMO

## 2018-12-10 DIAGNOSIS — R6889 Other general symptoms and signs: Secondary | ICD-10-CM | POA: Diagnosis not present

## 2018-12-11 ENCOUNTER — Telehealth: Payer: Self-pay | Admitting: Family Medicine

## 2018-12-11 ENCOUNTER — Telehealth: Payer: Medicare HMO | Admitting: Family Medicine

## 2018-12-11 ENCOUNTER — Other Ambulatory Visit (INDEPENDENT_AMBULATORY_CARE_PROVIDER_SITE_OTHER): Payer: Self-pay | Admitting: Specialist

## 2018-12-11 ENCOUNTER — Ambulatory Visit (INDEPENDENT_AMBULATORY_CARE_PROVIDER_SITE_OTHER): Payer: Medicare HMO | Admitting: Psychology

## 2018-12-11 DIAGNOSIS — F509 Eating disorder, unspecified: Secondary | ICD-10-CM

## 2018-12-11 NOTE — Telephone Encounter (Signed)
Diclofenac gel refill request

## 2018-12-11 NOTE — Telephone Encounter (Signed)
In preparation for today's visit it was noted that ordered for CPAP therapy were not changed.  Patient was seen November 13, 2018 when order was placed for minimum pressure of 6 cm of water and maximum pressure of 16 cm of water.  Compliance report dated 11/10/2018 through 12/09/2018 reveals that the settings have not been adjusted.  AHI remains elevated at 12.6.  Central apnea of 9.9 he is feeling great and having no concerns with the machine.  I have discussed with him the need for updated settings prior to completion of visit.  We will contact DME company today to ensure that this is been performed.  We will reschedule follow-up visit for 4 weeks from pressure changes.  We have also discussed potential need for consideration of BiPAP therapy due to elevated central apneas.  Patient verbalizes understanding and agreement with this plan.

## 2018-12-17 DIAGNOSIS — R6889 Other general symptoms and signs: Secondary | ICD-10-CM | POA: Diagnosis not present

## 2018-12-23 ENCOUNTER — Other Ambulatory Visit (HOSPITAL_COMMUNITY): Payer: Self-pay | Admitting: Psychiatry

## 2018-12-23 DIAGNOSIS — F319 Bipolar disorder, unspecified: Secondary | ICD-10-CM

## 2018-12-23 DIAGNOSIS — F411 Generalized anxiety disorder: Secondary | ICD-10-CM

## 2018-12-26 DIAGNOSIS — M479 Spondylosis, unspecified: Secondary | ICD-10-CM | POA: Diagnosis not present

## 2018-12-26 DIAGNOSIS — M160 Bilateral primary osteoarthritis of hip: Secondary | ICD-10-CM | POA: Diagnosis not present

## 2018-12-26 DIAGNOSIS — Z89619 Acquired absence of unspecified leg above knee: Secondary | ICD-10-CM | POA: Diagnosis not present

## 2018-12-26 DIAGNOSIS — G4733 Obstructive sleep apnea (adult) (pediatric): Secondary | ICD-10-CM | POA: Diagnosis not present

## 2018-12-26 DIAGNOSIS — E119 Type 2 diabetes mellitus without complications: Secondary | ICD-10-CM | POA: Diagnosis not present

## 2018-12-26 DIAGNOSIS — R6889 Other general symptoms and signs: Secondary | ICD-10-CM | POA: Diagnosis not present

## 2018-12-26 DIAGNOSIS — I10 Essential (primary) hypertension: Secondary | ICD-10-CM | POA: Diagnosis not present

## 2018-12-26 DIAGNOSIS — Z7902 Long term (current) use of antithrombotics/antiplatelets: Secondary | ICD-10-CM | POA: Diagnosis not present

## 2018-12-30 ENCOUNTER — Encounter: Payer: Medicare HMO | Attending: General Surgery | Admitting: Skilled Nursing Facility1

## 2018-12-30 ENCOUNTER — Other Ambulatory Visit: Payer: Self-pay

## 2018-12-30 DIAGNOSIS — R6889 Other general symptoms and signs: Secondary | ICD-10-CM | POA: Diagnosis not present

## 2018-12-30 DIAGNOSIS — E669 Obesity, unspecified: Secondary | ICD-10-CM

## 2018-12-30 DIAGNOSIS — Z6841 Body Mass Index (BMI) 40.0 and over, adult: Secondary | ICD-10-CM | POA: Diagnosis not present

## 2018-12-30 DIAGNOSIS — G4733 Obstructive sleep apnea (adult) (pediatric): Secondary | ICD-10-CM | POA: Diagnosis not present

## 2018-12-30 DIAGNOSIS — Z713 Dietary counseling and surveillance: Secondary | ICD-10-CM | POA: Diagnosis not present

## 2018-12-30 NOTE — Progress Notes (Signed)
Pre-Operative Nutrition Class:  Appt start time: 9678   End time:  1830.  Patient was seen on 08/1082020 for Pre-Operative Bariatric Surgery Education at [facility].   Surgery date:  Surgery type: sleeve Start weight at [facility]: 317.8 (with prothesis) Weight today: 337.1 (with prothesis)   The following the learning objectives were met by the patient during this course:  Identify Pre-Op Dietary Goals and will begin 2 weeks pre-operatively  Identify appropriate sources of fluids and proteins   State protein recommendations and appropriate sources pre and post-operatively  Identify Post-Operative Dietary Goals and will follow for 2 weeks post-operatively  Identify appropriate multivitamin and calcium sources  Describe the need for physical activity post-operatively and will follow MD recommendations  State when to call healthcare provider regarding medication questions or post-operative complications  Handouts given during class include:  Pre-Op Bariatric Surgery Diet Handout  Protein Shake Handout  Post-Op Bariatric Surgery Nutrition Handout  [facility] Program Information Flyer  Support Group Information Flyer  [facility] Bariatric Supplements Price List  Follow-Up Plan: Patient will follow-up at [facility] 2 weeks post operatively for diet advancement per MD.

## 2019-01-14 ENCOUNTER — Other Ambulatory Visit: Payer: Self-pay | Admitting: Internal Medicine

## 2019-01-20 ENCOUNTER — Other Ambulatory Visit (HOSPITAL_COMMUNITY): Payer: Self-pay | Admitting: Psychiatry

## 2019-01-20 DIAGNOSIS — F319 Bipolar disorder, unspecified: Secondary | ICD-10-CM

## 2019-01-20 DIAGNOSIS — F411 Generalized anxiety disorder: Secondary | ICD-10-CM

## 2019-01-24 ENCOUNTER — Other Ambulatory Visit: Payer: Medicare HMO

## 2019-01-24 ENCOUNTER — Other Ambulatory Visit: Payer: Self-pay

## 2019-01-24 ENCOUNTER — Encounter: Payer: Self-pay | Admitting: Internal Medicine

## 2019-01-24 ENCOUNTER — Ambulatory Visit (INDEPENDENT_AMBULATORY_CARE_PROVIDER_SITE_OTHER): Payer: Medicare HMO | Admitting: Internal Medicine

## 2019-01-24 VITALS — BP 124/78 | HR 78 | Ht 73.0 in | Wt 331.0 lb

## 2019-01-24 DIAGNOSIS — R21 Rash and other nonspecific skin eruption: Secondary | ICD-10-CM

## 2019-01-24 DIAGNOSIS — E785 Hyperlipidemia, unspecified: Secondary | ICD-10-CM

## 2019-01-24 DIAGNOSIS — R6889 Other general symptoms and signs: Secondary | ICD-10-CM | POA: Diagnosis not present

## 2019-01-24 DIAGNOSIS — R194 Change in bowel habit: Secondary | ICD-10-CM | POA: Diagnosis not present

## 2019-01-24 DIAGNOSIS — I10 Essential (primary) hypertension: Secondary | ICD-10-CM

## 2019-01-24 DIAGNOSIS — R739 Hyperglycemia, unspecified: Secondary | ICD-10-CM

## 2019-01-24 DIAGNOSIS — R197 Diarrhea, unspecified: Secondary | ICD-10-CM | POA: Diagnosis not present

## 2019-01-24 DIAGNOSIS — A09 Infectious gastroenteritis and colitis, unspecified: Secondary | ICD-10-CM | POA: Diagnosis not present

## 2019-01-24 DIAGNOSIS — K8689 Other specified diseases of pancreas: Secondary | ICD-10-CM | POA: Diagnosis not present

## 2019-01-24 DIAGNOSIS — Z23 Encounter for immunization: Secondary | ICD-10-CM | POA: Diagnosis not present

## 2019-01-24 LAB — POCT GLYCOSYLATED HEMOGLOBIN (HGB A1C): Hemoglobin A1C: 5.8 % — AB (ref 4.0–5.6)

## 2019-01-24 MED ORDER — METHYLPREDNISOLONE ACETATE 80 MG/ML IJ SUSP
80.0000 mg | Freq: Once | INTRAMUSCULAR | Status: AC
  Administered 2019-01-24: 80 mg via INTRAMUSCULAR

## 2019-01-24 MED ORDER — PREDNISONE 10 MG PO TABS: ORAL_TABLET | ORAL | 0 refills | Status: DC

## 2019-01-24 NOTE — Patient Instructions (Addendum)
You had the flu shot today  Please take all new medication as prescribed - the prednisone  Your A1c was OK today  Please continue all other medications as before, and refills have been done if requested.  Please have the pharmacy call with any other refills you may need.  Please continue your efforts at being more active, low cholesterol diet, and weight control  Please keep your appointments with your specialists as you may have planned  Good luck with gettting the bariatric surgury soon  Please return in 6 months, or sooner if needed

## 2019-01-24 NOTE — Progress Notes (Addendum)
Subjective:    Patient ID: Travis [name]., male    DOB: 07-20-1950, 68 y.o.   MRN: [id number]  HPI  Here to f/u; overall doing ok,  Pt denies chest pain, increasing sob or doe, wheezing, orthopnea, PND, increased LE swelling, palpitations, dizziness or syncope.  Pt denies new neurological symptoms such as new headache, or facial or extremity weakness or numbness.  Pt denies polydipsia, polyuria, or low sugar episode.  Pt states overall good compliance with meds, mostly trying to follow appropriate diet,  Lost 6 lbs with better diet, and trying to be more active.   Wt Readings from Last 3 Encounters:  01/24/19 (!) 331 lb (150.1 kg)  12/30/18 (!) 337 lb 1.6 oz (152.9 kg)  11/19/18 (!) 330 lb (149.7 kg)  Now also believes he is allergic to mild whey through process of elimination, but did drop off the stool sample today.  Also unfortunately has markedly itchy rash to all extremities, and has numerous sores and excoriations due to scratching.  No fever. And pt now being considered for bariatric surgury soon if approved by insurance Past Medical History:  Diagnosis Date  . ALLERGIC RHINITIS 06/24/2009  . Allergy   . Anxiety 11/12/2011   Adequate for discharge   . Arthritis    "all my joints" (09/30/2013)  . Arthritis of foot, right, degenerative 04/15/2014  . Balance disorder 03/12/2013  . Benign neoplasm of cecum   . Benign neoplasm of descending colon   . CAD (coronary artery disease) 06/24/2009   5 stents placed in 2007    . Chronic anticoagulation   . Chronic pain syndrome 10/27/2009   of ankle, shoulders, low back.  sciatica.   . Closed fracture of right foot 10/17/2014  . CORONARY ARTERY DISEASE 06/24/2009   a. s/p multiple PCIs - In 2008 he had a Taxus DES to the mild LAD, Endeavor DES to mid LCX and distal LCX. In January 2009 he had DES to distal LCX, mid LCX and proximal LCX. In November 2009 had BMS x 2 to the mid RCA. Cath 10/2011 with patent stents, noncardiac CP. LHC 01/2013:  patent stents (noncardiac CP).  . DEGENERATIVE JOINT DISEASE 06/24/2009   Qualifier: Diagnosis of  By: [name], [name]   . Depression   . Depression with anxiety    Prior suicide attempt  . [place] DISEASE, LUMBAR 04/19/2010  . ERECTILE DYSFUNCTION, ORGANIC 05/30/2010  . Essential hypertension 06/24/2009   Qualifier: Diagnosis of  By: [name], [name]   . Fibromyalgia   . Fracture dislocation of ankle joint 09/02/2015  . Gait disorder 03/12/2013  . General weakness 07/14/2014  . GERD (gastroesophageal reflux disease) 09/08/2015  . Hand joint pain 06/10/2013  . Heart murmur   . Hepatitis C   . History of kidney stones   . Hyperlipidemia 07/15/2009   Qualifier: Diagnosis of  By: Aundra Dubin, MD, [place]    . HYPERLIPIDEMIA-MIXED 07/15/2009  . HYPERTENSION 06/24/2009  . Insomnia 10/04/2011  . Irregular heart beat   . Left hip pain 03/12/2013   Injected under ultrasound guidance on June 24, 2013   . Major depression 09/13/2015  . Myocardial infarction ([place]) 2008  . Non-cardiac chest pain 10/2011, 01/2013  . Obesity   . Occult blood positive stool 10/17/2014  . Open ankle fracture 09/02/2015  . OSA (obstructive sleep apnea)    not using CPAP (09/30/2013)  . Pain of right thumb 04/03/2013  . Pneumonia   . PPD positive 04/08/2015  .  Pre-ulcerative corn or callous 02/06/2013  . Rotator cuff tear arthropathy of both shoulders 06/10/2013   History of bilateral shoulder cuff surgery for rotator cuff tears. Reports increase in pain 09/11/2015 during physical therapy of the left shoulder.   [name] SCIATICA, LEFT 04/19/2010   Qualifier: Diagnosis of  By: [name], [name]   . Sleep apnea    wears cpap   . Spinal stenosis in cervical region 09/26/2013  . Spinal stenosis, lumbar region, with neurogenic claudication 09/26/2013  . Type II diabetes mellitus ([place]) 2012   no meds in 09/2014.   [name] Uncontrolled type 2 DM with peripheral circulatory disorder ([place]) 10/04/2013  . URETHRAL STRICTURE 06/24/2009   self catheterizes.     Past Surgical History:  Procedure Laterality Date  . AMPUTATION Right 06/14/2016   Procedure: AMPUTATION BELOW KNEE;  Surgeon: Newt Minion, MD;  Location: [place];  Service: Orthopedics;  Laterality: Right;  . ANKLE FUSION Right 04/15/2014   Procedure: Right Subtalar, Talonavicular Fusion;  Surgeon: Newt Minion, MD;  Location: [place];  Service: Orthopedics;  Laterality: Right;  . ANKLE FUSION Right 04/18/2016   Procedure: Right Ankle Tibiocalcaneal Fusion;  Surgeon: Newt Minion, MD;  Location: [place];  Service: Orthopedics;  Laterality: Right;  . ANTERIOR CERVICAL DECOMP/DISCECTOMY FUSION N/A 09/26/2013   Procedure: ANTERIOR CERVICAL DISCECTOMY FUSION C3-4, plate and screw fixation, allograft bone graft;  Surgeon: Jessy Oto, MD;  Location: [place];  Service: Orthopedics;  Laterality: N/A;  . BACK SURGERY    . BELOW KNEE LEG AMPUTATION Right 06/14/2016  . CARDIAC CATHETERIZATION  X 1  . CARPAL TUNNEL RELEASE Bilateral   . COLONOSCOPY    . COLONOSCOPY N/A 10/22/2014   Procedure: COLONOSCOPY;  Surgeon: Lafayette Dragon, MD;  Location: [facility] ENDOSCOPY;  Service: Endoscopy;  Laterality: N/A;  . CORONARY ANGIOPLASTY WITH STENT PLACEMENT     "I have 9 stents"  . ESOPHAGOGASTRODUODENOSCOPY N/A 10/19/2014   Procedure: ESOPHAGOGASTRODUODENOSCOPY (EGD);  Surgeon: Jerene Bears, MD;  Location[facility] ENDOSCOPY;  Service: Endoscopy;  Laterality: N/A;  . FRACTURE SURGERY    . FUSION OF TALONAVICULAR JOINT Right 04/15/2014   dr duda  . HERNIA REPAIR     umbilical  . INGUINAL HERNIA REPAIR Right 05/11/2015   Procedure: LAPAROSCOPIC REPAIR RIGHT  INGUINAL HERNIA;  Surgeon: Greer Pickerel, MD;  Location: [place];  Service: General;  Laterality: Right;  . INSERTION OF MESH Right 05/11/2015   Procedure: INSERTION OF MESH;  Surgeon: Greer Pickerel, MD;  Location: [place];  Service: General;  Laterality: Right;  . JOINT REPLACEMENT    . KNEE CARTILAGE SURGERY Right X 12   "~ 1/2 open; ~ 1/2 scopes"  . KNEE CARTILAGE SURGERY  Left X 3   "3 scopes"  . LEFT HEART CATHETERIZATION WITH CORONARY ANGIOGRAM N/A 02/10/2013   Procedure: LEFT HEART CATHETERIZATION WITH CORONARY ANGIOGRAM;  Surgeon: Burnell Blanks, MD;  Location: [facility] CATH LAB;  Service: Cardiovascular;  Laterality: N/A;  . LUMBAR LAMINECTOMY N/A 07/16/2018   Procedure: LEFT L4-5 REDO PARTIAL LUMBAR HEMILAMINECTOMY WITH FORAMINOTOMY LEFT L4;  Surgeon: Jessy Oto, MD;  Location: [place];  Service: Orthopedics;  Laterality: N/A;  . LUMBAR LAMINECTOMY/DECOMPRESSION MICRODISCECTOMY N/A 01/27/2014   Procedure: CENTRAL LUMBAR LAMINECTOMY L4-5 AND L3-4;  Surgeon: Jessy Oto, MD;  Location: [place];  Service: Orthopedics;  Laterality: N/A;  . ORIF ANKLE FRACTURE Right 09/02/2015   Procedure: OPEN REDUCTION INTERNAL FIXATION (ORIF) ANKLE FRACTURE;  Surgeon: [name]  V, MD;  Location: [place];  Service: Orthopedics;  Laterality: Right;  . PERIPHERALLY INSERTED CENTRAL CATHETER INSERTION  09/02/2015  . POLYPECTOMY    . SHOULDER ARTHROSCOPY W/ ROTATOR CUFF REPAIR Bilateral    "3 on the right; 1 on the left"  . SKIN SPLIT GRAFT Right 10/01/2015   Procedure: RIGHT ANKLE APPLY SKIN GRAFT SPLIT THICKNESS;  Surgeon: Newt Minion, MD;  Location: [place];  Service: Orthopedics;  Laterality: Right;  . TONSILLECTOMY    . TOOTH EXTRACTION    . TOTAL HIP ARTHROPLASTY Right 04/17/2018  . TOTAL HIP ARTHROPLASTY Right 04/17/2018   Procedure: RIGHT TOTAL HIP ARTHROPLASTY;  Surgeon: Newt Minion, MD;  Location: [place];  Service: Orthopedics;  Laterality: Right;  . TOTAL KNEE ARTHROPLASTY Bilateral 2008  . UMBILICAL HERNIA REPAIR     UHR  . UPPER GASTROINTESTINAL ENDOSCOPY    . URETHRAL DILATION  X 4  . VASECTOMY    . WISDOM TOOTH EXTRACTION      reports that he quit smoking about 8 years ago. His smoking use included cigars. He has never used smokeless tobacco. He reports current alcohol use. He reports that he does not use drugs. family history includes Breast cancer in his  mother; Cancer in his mother; Coronary artery disease in an other family member; Depression in his brother, mother, and another family member; Diabetes in his father; Early death in his maternal grandfather and paternal grandfather; Healthy in his son and son; Heart attack (age of onset: 74) in his maternal grandfather; Heart disease in his father, maternal grandfather, and mother; Hyperlipidemia in his father; Hypertension in his brother, father, mother, and another family member; Prostate cancer in his father; Skin cancer in his father. No Known Allergies Current Outpatient Medications on File Prior to Visit  Medication Sig Dispense Refill  . ARIPiprazole (ABILIFY) 5 MG tablet Take 1 tablet (5 mg total) by mouth at bedtime. 90 tablet 0  . aspirin 81 MG tablet Take 81 mg by mouth at bedtime.     [name] atorvastatin (LIPITOR) 20 MG tablet Take 1 tablet (20 mg total) by mouth daily. 90 tablet 3  . benztropine (COGENTIN) 0.5 MG tablet Take 1 tablet (0.5 mg total) by mouth at bedtime. 90 tablet 0  . celecoxib (CELEBREX) 200 MG capsule Take 1 capsule (200 mg total) by mouth 2 (two) times daily. 180 capsule 3  . clonazePAM (KLONOPIN) 0.5 MG tablet Take 1 tablet (0.5 mg total) by mouth at bedtime. 30 tablet 2  . clotrimazole-betamethasone (LOTRISONE) cream Apply thin layer to affected area twice daily for 1-2 weeks    . diclofenac sodium (VOLTAREN) 1 % GEL Apply 4 g topically 4 (four) times daily. 400 g 3  . DULoxetine (CYMBALTA) 30 MG capsule Take 1 capsule (30 mg total) by mouth 2 (two) times daily. 180 capsule 0  . gabapentin (NEURONTIN) 600 MG tablet Take 1 tablet (600 mg total) by mouth 3 (three) times daily. 90 tablet 3  . gabapentin (NEURONTIN) 800 MG tablet Take 1 tablet (800 mg total) by mouth 3 times daily.    . hydrochlorothiazide (HYDRODIURIL) 25 MG tablet Take 25 mg by mouth at bedtime.     . Melatonin 5 MG TABS Take 20 mg by mouth at bedtime.     . methocarbamol (ROBAXIN) 500 MG tablet Take 1  tablet (500 mg total) by mouth every 8 (eight) hours as needed for muscle spasms. 90 tablet 2  . Omega-3 Fatty Acids (FISH OIL)  1000 MG CAPS Take 1,000 mg by mouth at bedtime.     [name] oxymetazoline (AFRIN) 0.05 % nasal spray Place 1 spray into both nostrils 2 (two) times daily as needed for congestion.    . pantoprazole (PROTONIX) 40 MG tablet TAKE ONE TABLET BY MOUTH EVERY DAY 90 tablet 1  . prasugrel (EFFIENT) 5 MG TABS tablet Take 1 tablet (5 mg total) by mouth daily. 90 tablet 2  . sildenafil (VIAGRA) 100 MG tablet Take 100 mg by mouth daily.    . traZODone (DESYREL) 150 MG tablet Take 1 tablet (150 mg total) by mouth at bedtime. 90 tablet 0   No current facility-administered medications on file prior to visit.    Review of Systems  Constitutional: Negative for other unusual diaphoresis or sweats HENT: Negative for ear discharge or swelling Eyes: Negative for other worsening visual disturbances Respiratory: Negative for stridor or other swelling  Gastrointestinal: Negative for worsening distension or other blood Genitourinary: Negative for retention or other urinary change Musculoskeletal: Negative for other MSK pain or swelling Skin: Negative for color change or other new lesions Neurological: Negative for worsening tremors and other numbness  Psychiatric/Behavioral: Negative for worsening agitation or other fatigue All other system neg per pt    Objective:   Physical Exam BP 124/78   Pulse 78   Ht 6' 1"  (1.854 m)   Wt (!) 331 lb (150.1 kg)   SpO2 94%   BMI 43.67 kg/m  VS noted,  Constitutional: Pt appears in NAD HENT: Head: NCAT.  Right Ear: External ear normal.  Left Ear: External ear normal.  Eyes: . Pupils are equal, round, and reactive to light. Conjunctivae and EOM are normal Nose: without d/c or deformity Neck: Neck supple. Gross normal ROM Cardiovascular: Normal rate and regular rhythm.   Pulmonary/Chest: Effort normal and breath sounds without rales or wheezing.   Abd:  Soft, NT, ND, + BS, no organomegaly Neurological: Pt is alert. At baseline orientation, motor grossly intact Skin: Skin is warm. trace LLE edema, has numerous shallow sores to all extremities, s/p right amputation Psychiatric: Pt behavior is normal without agitation  No other exam findings Lab Results  Component Value Date   WBC 4.9 09/03/2018   HGB 11.7 (L) 09/03/2018   HCT 36.3 (L) 09/03/2018   PLT 376 09/03/2018   GLUCOSE 157 (H) 08/17/2018   CHOL 124 06/03/2018   TRIG 119.0 06/03/2018   HDL 39.00 (L) 06/03/2018   LDLCALC 61 06/03/2018   ALT 19 08/15/2018   AST 34 08/15/2018   NA 136 08/17/2018   K 3.8 08/17/2018   CL 104 08/17/2018   CREATININE 0.84 08/17/2018   BUN 11 08/17/2018   CO2 22 08/17/2018   TSH 1.69 06/03/2018   PSA 0.73 06/03/2018   INR 1.2 07/15/2018   HGBA1C 5.2 06/03/2018   MICROALBUR 1.6 06/03/2018   Declines further labs today  POCT glycosylated hemoglobin (Hb A1C) Order: [id number] Status:  Final result Visible to patient:  No (not released) Dx:  Hyperglycemia  Ref Range & Units 11:11 (01/24/19) 32moago (06/03/18) 1674yrgo (01/10/18) 1y774yro (07/13/17) 34yr534yr (03/01/17) 76yr 334yr(04/18/16) 3yr a74yr6/21/17)  Hemoglobin A1C 4.0 - 5.6 % 5.8Abnormal   5.2 R, CM  5.5 R, CM  5.4 R, CM  5.7 R, CM  5.3 R, CM  5.2 R,           Assessment & Plan:

## 2019-01-24 NOTE — Assessment & Plan Note (Signed)
stable overall by history and exam, recent data reviewed with pt, and pt to continue medical treatment as before,  to f/u any worsening symptoms or concerns lbe

## 2019-01-24 NOTE — Assessment & Plan Note (Signed)
C/w allergic type, for depomedrol IM 80, and predpac asd, but also consider derm and/or allergy if not improving or worsening

## 2019-01-24 NOTE — Assessment & Plan Note (Signed)
stable overall by history and exam, recent data reviewed with pt, and pt to continue medical treatment as before,  to f/u any worsening symptoms or concerns, for a1c today

## 2019-01-24 NOTE — Assessment & Plan Note (Signed)
stable overall by history and exam, recent data reviewed with pt, and pt to continue medical treatment as before,  to f/u any worsening symptoms or concerns  

## 2019-01-29 DIAGNOSIS — G546 Phantom limb syndrome with pain: Secondary | ICD-10-CM | POA: Diagnosis not present

## 2019-01-29 DIAGNOSIS — M961 Postlaminectomy syndrome, not elsewhere classified: Secondary | ICD-10-CM | POA: Diagnosis not present

## 2019-01-29 DIAGNOSIS — M47816 Spondylosis without myelopathy or radiculopathy, lumbar region: Secondary | ICD-10-CM | POA: Diagnosis not present

## 2019-01-29 DIAGNOSIS — G894 Chronic pain syndrome: Secondary | ICD-10-CM | POA: Diagnosis not present

## 2019-01-30 DIAGNOSIS — G4733 Obstructive sleep apnea (adult) (pediatric): Secondary | ICD-10-CM | POA: Diagnosis not present

## 2019-02-01 LAB — PANCREATIC ELASTASE, FECAL: Pancreatic Elastase-1, Stool: <15 mcg/g — ABNORMAL LOW

## 2019-02-01 LAB — GASTROINTESTINAL PATHOGEN PANEL PCR
C. difficile Tox A/B, PCR: NOT DETECTED
Campylobacter, PCR: NOT DETECTED
Cryptosporidium, PCR: NOT DETECTED
E coli (ETEC) LT/ST PCR: NOT DETECTED
E coli (STEC) stx1/stx2, PCR: NOT DETECTED
E coli 0157, PCR: NOT DETECTED
Giardia lamblia, PCR: NOT DETECTED
Norovirus, PCR: NOT DETECTED
Rotavirus A, PCR: NOT DETECTED
Salmonella, PCR: NOT DETECTED
Shigella, PCR: NOT DETECTED

## 2019-02-04 ENCOUNTER — Other Ambulatory Visit: Payer: Self-pay

## 2019-02-04 DIAGNOSIS — R194 Change in bowel habit: Secondary | ICD-10-CM

## 2019-02-04 DIAGNOSIS — R6889 Other general symptoms and signs: Secondary | ICD-10-CM | POA: Diagnosis not present

## 2019-02-04 MED ORDER — ZENPEP 15000-47000 UNITS PO CPEP: 40000.0000 [IU] | ORAL_CAPSULE | Freq: Three times a day (TID) | ORAL | 3 refills | Status: DC

## 2019-02-04 NOTE — Progress Notes (Signed)
zenpep

## 2019-02-05 ENCOUNTER — Other Ambulatory Visit: Payer: Self-pay

## 2019-02-05 DIAGNOSIS — R194 Change in bowel habit: Secondary | ICD-10-CM

## 2019-02-06 ENCOUNTER — Telehealth: Payer: Self-pay | Admitting: *Deleted

## 2019-02-06 ENCOUNTER — Telehealth: Payer: Self-pay | Admitting: Cardiovascular Disease

## 2019-02-06 NOTE — Telephone Encounter (Signed)
I s/w pt to let him know that we did not have a clearance come over for his surgery. Last clearance was 06/25/18. Pt said he is now having bariatric surgery. I advised pt to please call the surgeon's office and let them know they need to fax over a clearance form. Pt thanked me for the call back and said he will call the surgeon's office.

## 2019-02-06 NOTE — Telephone Encounter (Signed)
New message:    Patient calling stating that his surgeon told him that they sent over a medical clearance. I look and do not see it in his chart. Please call patient.

## 2019-02-06 NOTE — Telephone Encounter (Signed)
   [facility] Pre-operative Risk Assessment    Request for surgical clearance:  1. What type of surgery is being performed? LAPAROSCOPIC SLEEVE GASTRECTOMY   2. When is this surgery scheduled? TBD - WOULD LIKE TO HAVE DONE BY THE END OF 02/2019   3. What type of clearance is required (medical clearance vs. Pharmacy clearance to hold med vs. Both)? MEDICAL  4. Are there any medications that need to be held prior to surgery and how long? EFFIENT  AND ASA  5. Practice name and name of physician performing surgery?  [facility]; DR. ERIC WILSON  6. What is your office phone number [phone]    7.   What is your office fax number [phone]  8.   Anesthesia type (None, local, MAC, general) ? GENERAL   Travis [name] 02/06/2019, 3:52 PM  _________________________________________________________________   (provider comments below)

## 2019-02-07 NOTE — Telephone Encounter (Signed)
Sent in error to Pharm-D.

## 2019-02-07 NOTE — Telephone Encounter (Signed)
Pharmacy does not provide clearance for antiplatelets. Please send to MD

## 2019-02-10 ENCOUNTER — Ambulatory Visit (HOSPITAL_COMMUNITY)
Admission: RE | Admit: 2019-02-10 | Discharge: 2019-02-10 | Disposition: A | Discharge status: Home / Self Care | Payer: Medicare HMO | Source: Ambulatory Visit | Attending: Gastroenterology | Admitting: Gastroenterology

## 2019-02-10 ENCOUNTER — Other Ambulatory Visit: Payer: Self-pay

## 2019-02-10 DIAGNOSIS — Z87442 Personal history of urinary calculi: Secondary | ICD-10-CM | POA: Diagnosis not present

## 2019-02-10 DIAGNOSIS — E119 Type 2 diabetes mellitus without complications: Secondary | ICD-10-CM | POA: Diagnosis not present

## 2019-02-10 DIAGNOSIS — E785 Hyperlipidemia, unspecified: Secondary | ICD-10-CM | POA: Diagnosis not present

## 2019-02-10 DIAGNOSIS — R194 Change in bowel habit: Secondary | ICD-10-CM | POA: Diagnosis not present

## 2019-02-10 DIAGNOSIS — N281 Cyst of kidney, acquired: Secondary | ICD-10-CM | POA: Diagnosis not present

## 2019-02-10 DIAGNOSIS — Z86010 Personal history of colonic polyps: Secondary | ICD-10-CM | POA: Diagnosis not present

## 2019-02-10 DIAGNOSIS — I10 Essential (primary) hypertension: Secondary | ICD-10-CM | POA: Insufficient documentation

## 2019-02-10 DIAGNOSIS — I2510 Atherosclerotic heart disease of native coronary artery without angina pectoris: Secondary | ICD-10-CM | POA: Diagnosis not present

## 2019-02-10 DIAGNOSIS — R6889 Other general symptoms and signs: Secondary | ICD-10-CM | POA: Diagnosis not present

## 2019-02-10 DIAGNOSIS — K219 Gastro-esophageal reflux disease without esophagitis: Secondary | ICD-10-CM | POA: Diagnosis not present

## 2019-02-10 LAB — POCT I-STAT CREATININE: Creatinine, Ser: 1 mg/dL (ref 0.61–1.24)

## 2019-02-10 MED ORDER — IOHEXOL 300 MG/ML  SOLN
100.0000 mL | Freq: Once | INTRAMUSCULAR | Status: AC | PRN
  Administered 2019-02-10: 100 mL via INTRAVENOUS

## 2019-02-11 ENCOUNTER — Ambulatory Visit (INDEPENDENT_AMBULATORY_CARE_PROVIDER_SITE_OTHER): Payer: Medicare HMO | Admitting: Psychiatry

## 2019-02-11 ENCOUNTER — Encounter (HOSPITAL_COMMUNITY): Payer: Self-pay | Admitting: Psychiatry

## 2019-02-11 DIAGNOSIS — F411 Generalized anxiety disorder: Secondary | ICD-10-CM

## 2019-02-11 DIAGNOSIS — F319 Bipolar disorder, unspecified: Secondary | ICD-10-CM | POA: Diagnosis not present

## 2019-02-11 MED ORDER — DULOXETINE HCL 30 MG PO CPEP: 30.0000 mg | ORAL_CAPSULE | Freq: Two times a day (BID) | ORAL | 0 refills | Status: DC

## 2019-02-11 MED ORDER — TRAZODONE HCL 150 MG PO TABS: 150.0000 mg | ORAL_TABLET | Freq: Every day | ORAL | 0 refills | Status: DC

## 2019-02-11 MED ORDER — BENZTROPINE MESYLATE 0.5 MG PO TABS: 0.5000 mg | ORAL_TABLET | Freq: Every day | ORAL | 0 refills | Status: DC

## 2019-02-11 MED ORDER — ARIPIPRAZOLE 5 MG PO TABS: 5.0000 mg | ORAL_TABLET | Freq: Every day | ORAL | 0 refills | Status: DC

## 2019-02-11 NOTE — Progress Notes (Signed)
Virtual Visit via Telephone Note  I connected with Travis Novak Sr. on 02/11/19 at  2:00 PM EDT by telephone and verified that I am speaking with the correct person using two identifiers.   I discussed the limitations, risks, security and privacy concerns of performing an evaluation and management service by telephone and the availability of in person appointments. I also discussed with the patient that there may be a patient responsible charge related to this service. The patient expressed understanding and agreed to proceed.   History of Present Illness: Patient was evaluated by phone session.  He is taking his medication as prescribed.  Recently he saw his primary care physician Dr. Ronnald Ramp.  His hemoglobin A1c is 5.8.  He is getting Klonopin from his primary care physician along with gabapentin.  He is excited about upcoming bariatric surgery on October 27 at [facility].  He is sleeping good.  He denies any irritability, anger, mania or any psychosis.  He denies any crying spells.  He sleeps 6 to 8 hours.  He has no tremors, rash, itching or shakes.  He lives with his niece and her family who is very supportive.  Despite trying diet his weight is unchanged from the past.  He denies drinking or using any illegal substances.  He is taking moderate dose of pain medicine and muscle relaxant.  Past Psychiatric History:Reviewed Seenpsychiatrist in [place] and [place]. H/Osevere mood swings,suicidal gesture,anger,hallucination, irritability,poor impulse control andincreasespending.Married 5 times. H/Oinpatient in 2014 due to overdose on Xanaxandalcohol.DidIOP. TookEffexor, Xanax, Klonopin, Ambien,Lunesta, Wellbutrin, Ambien,Remeronand Prozac. Abilifystarted on Abilify from this office.   Recent Results (from the past 2160 hour(s))  Pancreatic elastase, fecal     Status: Abnormal   Collection Time: 01/24/19 10:20 AM  Result Value Ref Range   Pancreatic Elastase-1,  Stool <15 (L) mcg/g    Comment: . Adult and Pediatric Reference Ranges for   Pancreatic Elastase-1: .              Normal:      >200 mcg/g Moderate Pancreatic       Insufficiency:   100-200 mcg/g   Severe Pancreatic       Insufficiency:      <100 mcg/g . Elastase-1 (E-1) assay results are expressed in mcg/g, which represent mcg E1/g feces. . It is not necessary to interrupt enzyme substitution therapy.   Gastrointestinal Pathogen Panel PCR     Status: None   Collection Time: 01/24/19 10:20 AM  Result Value Ref Range   Campylobacter, PCR NOT DETECTED NOT DETECT   C. difficile Tox A/B, PCR NOT DETECTED NOT DETECT   E coli 0157, PCR NOT DETECTED NOT DETECT   E coli (ETEC) LT/ST PCR NOT DETECTED NOT DETECT   E coli (STEC) stx1/stx2, PCR NOT DETECTED NOT DETECT   Salmonella, PCR NOT DETECTED NOT DETECT   Shigella, PCR NOT DETECTED NOT DETECT   Norovirus, PCR NOT DETECTED NOT DETECT   Rotavirus A, PCR NOT DETECTED NOT DETECT   Giardia lamblia, PCR NOT DETECTED NOT DETECT   Cryptosporidium, PCR NOT DETECTED NOT DETECT    Comment: . The xTAG(R) Gastrointestinal Pathogen Panel results are presumptive and must be confirmed by FDA-cleared tests or other acceptable reference methods. The results of this test should not be used as the sole basis for diagnosis, treatment, or other patient management decisions. . Performed using the Luminex xTAG(R) Gastrointestinal Pathogen Panel test kit.   POCT glycosylated hemoglobin (Hb A1C)     Status:  Abnormal   Collection Time: 01/24/19 11:11 AM  Result Value Ref Range   Hemoglobin A1C 5.8 (A) 4.0 - 5.6 %   HbA1c POC (<> result, manual entry)     HbA1c, POC (prediabetic range)     HbA1c, POC (controlled diabetic range)    I-STAT creatinine     Status: None   Collection Time: 02/10/19  4:08 PM  Result Value Ref Range   Creatinine, Ser 1.00 0.61 - 1.24 mg/dL      Psychiatric Specialty Exam: Physical Exam  ROS  There were no vitals  taken for this visit.There is no height or weight on file to calculate BMI.  General Appearance: NA  Eye Contact:  NA  Speech:  Slow  Volume:  Normal  Mood:  Euthymic  Affect:  NA  Thought Process:  Goal Directed  Orientation:  Full (Time, Place, and Person)  Thought Content:  Logical  Suicidal Thoughts:  No  Homicidal Thoughts:  No  Memory:  Immediate;   Good Recent;   Good Remote;   Good  Judgement:  Good  Insight:  Good  Psychomotor Activity:  NA  Concentration:  Concentration: Good and Attention Span: Good  Recall:  Good  Fund of Knowledge:  Good  Language:  Good  Akathisia:  No  Handed:  Right  AIMS (if indicated):     Assets:  Communication Skills Desire for Improvement Housing Resilience Social Support  ADL's:  Intact  Cognition:  WNL  Sleep:   ok      Assessment and Plan: Bipolar disorder type I.  Generalized anxiety disorder.  Patient is a stable on his current medication.  He is hoping to have a bariatric surgery on October 27.  I reviewed blood work results.  His hemoglobin A1c is 5.8.  He does not want to change medication.  Continue Cymbalta 30 mg twice a day, Cogentin 0.5 mg at bedtime, Abilify 5 mg daily and trazodone 150 mg at bedtime.  He is getting Klonopin from his primary care physician.  Discussed medication side effects and benefits.  Recommended to call Travis [name] back if he has any question or any concern.  Follow-up in 3 months.  Follow Up Instructions:    I discussed the assessment and treatment plan with the patient. The patient was provided an opportunity to ask questions and all were answered. The patient agreed with the plan and demonstrated an understanding of the instructions.   The patient was advised to call back or seek an in-person evaluation if the symptoms worsen or if the condition fails to improve as anticipated.  I provided 20 minutes of non-face-to-face time during this encounter.   Kathlee Nations, MD

## 2019-02-14 NOTE — Telephone Encounter (Signed)
Follow Up:    Christy from Dr Nena Jordan office calling to check on the status of pt's clearance. Please fax to [phone]

## 2019-02-14 NOTE — Telephone Encounter (Signed)
Dr. Angelena Form, please review to see if it is ok with you to hold aspirin and brilinta, if so, how many days?   Patient had h/o multiple PCI. Last seen by Dr. Angelena Form for preop clearance prior to back surgery in Feb. Cleared to hold aspirin and Brilinta for 5 days. Patient was seen virtually by Sudie Grumbling in Apr 2020, he was doing well at the time.

## 2019-02-17 ENCOUNTER — Other Ambulatory Visit: Payer: Self-pay | Admitting: Internal Medicine

## 2019-02-17 NOTE — Telephone Encounter (Signed)
Done erx 

## 2019-02-17 NOTE — Telephone Encounter (Signed)
Ok to hold ASA and Effient 7 days before procedure.   Travis [name]

## 2019-02-17 NOTE — Telephone Encounter (Signed)
   Primary Cardiologist: Lauree Chandler, MD  Chart reviewed as part of pre-operative protocol coverage. Patient was contacted 02/17/2019 in reference to pre-operative risk assessment for pending surgery as outlined below.  Travis Novak Sr. was last seen in April 2020 by Lyda Jester PAC. Stress test in 03/2018 was low risk and echo with normal EF. He can complete more than 4.0 METS without anginal symptoms. He underwent hip surgery earlier this year without complications. He has a 0.9% risk of a major cardiac complication in the perioperative period.  Per Dr. Angelena Form: OK to hold ASA and effient 7 days before procedure.   Therefore, based on ACC/AHA guidelines, the patient would be at acceptable risk for the planned procedure without further cardiovascular testing.   I will route this recommendation to the requesting party via Epic fax function and remove from pre-op pool.  Please call with questions.  Tami Lin Jessy Calixte, PA 02/17/2019, 11:14 AM

## 2019-02-20 ENCOUNTER — Other Ambulatory Visit: Payer: Self-pay | Admitting: Cardiovascular Disease

## 2019-02-20 HISTORY — PX: GASTRIC BYPASS: SHX52

## 2019-02-20 MED ORDER — ATORVASTATIN CALCIUM 20 MG PO TABS: 20.0000 mg | ORAL_TABLET | Freq: Every day | ORAL | 1 refills | Status: DC

## 2019-02-24 ENCOUNTER — Encounter (HOSPITAL_COMMUNITY): Admission: RE | Admit: 2019-02-24 | Payer: Medicare HMO | Source: Ambulatory Visit

## 2019-02-26 ENCOUNTER — Other Ambulatory Visit (HOSPITAL_COMMUNITY): Payer: Self-pay | Admitting: Psychiatry

## 2019-02-26 ENCOUNTER — Encounter (HOSPITAL_COMMUNITY): Payer: Self-pay

## 2019-02-26 DIAGNOSIS — F319 Bipolar disorder, unspecified: Secondary | ICD-10-CM

## 2019-02-26 NOTE — Patient Instructions (Signed)
DUE TO COVID-19 ONLY ONE VISITOR IS ALLOWED TO COME WITH YOU AND STAY IN THE WAITING ROOM ONLY DURING PRE OP AND PROCEDURE DAY OF SURGERY. THE 1 VISITOR MAY VISIT WITH YOU AFTER SURGERY IN YOUR PRIVATE ROOM DURING VISITING HOURS ONLY!  YOU NEED TO HAVE A COVID 19 TEST ON_______ @_______ , THIS TEST MUST BE DONE BEFORE SURGERY, COME  [name] , [id number].  ([place]) ONCE YOUR COVID TEST IS COMPLETED, PLEASE BEGIN THE QUARANTINE INSTRUCTIONS AS OUTLINED IN YOUR HANDOUT.                Travis Novak Sr.  02/26/2019   Your procedure is scheduled on: 03-03-19   Report to [facility] Main  Entrance   Report to admitting at       0730 AM     Call this number if you have problems the morning of surgery [phone]    Remember: Do not eat food or drink liquids :After Midnight.   BRUSH YOUR TEETH MORNING OF SURGERY AND RINSE YOUR MOUTH OUT, NO CHEWING GUM CANDY OR MINTS.     Take these medicines the morning of surgery with A SIP OF WATER: protonix, myrbetriq, cymbalta, gabapentin, abilify  DO NOT TAKE ANY DIABETIC MEDICATIONS DAY OF YOUR SURGERY                               You may not have any metal on your body including hair pins and              piercings  Do not wear jewelry,  lotions, powders or perfumes, deodorant                        Men may shave face and neck.   Do not bring valuables to the hospital. [place].  Contacts, dentures or bridgework may not be worn into surgery.                 Please read over the following fact sheets you were given: ____________________________________________________________________          [facility] - Preparing for Surgery Before surgery, you can play an important role.  Because skin is not sterile, your skin needs to be as free of germs as possible.  You can reduce the number of germs on your skin by washing with CHG (chlorahexidine  gluconate) soap before surgery.  CHG is an antiseptic cleaner which kills germs and bonds with the skin to continue killing germs even after washing. Please DO NOT use if you have an allergy to CHG or antibacterial soaps.  If your skin becomes reddened/irritated stop using the CHG and inform your nurse when you arrive at Short Stay. Do not shave (including legs and underarms) for at least 48 hours prior to the first CHG shower.  You may shave your face/neck. Please follow these instructions carefully:  1.  Shower with CHG Soap the night before surgery and the  morning of Surgery.  2.  If you choose to wash your hair, wash your hair first as usual with your  normal  shampoo.  3.  After you shampoo, rinse your hair and body thoroughly to remove the  shampoo.  4.  Use CHG as you would any other liquid soap.  You can apply chg directly  to the skin and wash                       Gently with a scrungie or clean washcloth.  5.  Apply the CHG Soap to your body ONLY FROM THE NECK DOWN.   Do not use on face/ open                           Wound or open sores. Avoid contact with eyes, ears mouth and genitals (private parts).                       Wash face,  Genitals (private parts) with your normal soap.             6.  Wash thoroughly, paying special attention to the area where your surgery  will be performed.  7.  Thoroughly rinse your body with warm water from the neck down.  8.  DO NOT shower/wash with your normal soap after using and rinsing off  the CHG Soap.                9.  Pat yourself dry with a clean towel.            10.  Wear clean pajamas.            11.  Place clean sheets on your bed the night of your first shower and do not  sleep with pets. Day of Surgery : Do not apply any lotions/deodorants the morning of surgery.  Please wear clean clothes to the hospital/surgery center.  FAILURE TO FOLLOW THESE INSTRUCTIONS MAY RESULT IN THE CANCELLATION OF YOUR  SURGERY PATIENT SIGNATURE_________________________________  NURSE SIGNATURE__________________________________  ________________________________________________________________________

## 2019-02-26 NOTE — Progress Notes (Signed)
PCP - [name] Cardiologist - Jorge Mandril Alhany  LOV 4-20 with Verdis Frederickson PA-C clearance note in epic 03-03-19  Chest x-ray - 08-15-18 epic EKG - 07-28-18 epic Stress Test - 03-25-18 epic ECHO - 01-09-18 epic Cardiac Cath -  [id number]   01-24-19 epic  5.8  Sleep Study -  CPAP - yes  Fasting Blood Sugar - no DM meds  Checks Blood Sugar _____ times a day  Blood Thinner Instructions: may hold asa and Effient 7 days prior to  surgery Aspirin Instructions: Last Dose:  Anesthesia review: extensive hx., mi, blood thinner cpap  Patient denies shortness of breath, fever, cough and chest pain at PAT appointment  NONE   Patient verbalized understanding of instructions that were given to them at the PAT appointment. Patient was also instructed that they will need to review over the PAT instructions again at home before surgery.

## 2019-02-27 ENCOUNTER — Encounter (HOSPITAL_COMMUNITY)
Admission: RE | Admit: 2019-02-27 | Discharge: 2019-02-27 | Disposition: A | Discharge status: Home / Self Care | Payer: Medicare HMO | Source: Ambulatory Visit | Attending: General Surgery | Admitting: General Surgery

## 2019-02-27 ENCOUNTER — Other Ambulatory Visit (HOSPITAL_COMMUNITY)
Admission: RE | Admit: 2019-02-27 | Discharge: 2019-02-27 | Disposition: A | Discharge status: Home / Self Care | Payer: Medicare HMO | Source: Ambulatory Visit | Attending: General Surgery | Admitting: General Surgery

## 2019-02-27 ENCOUNTER — Encounter (HOSPITAL_COMMUNITY): Payer: Self-pay

## 2019-02-27 ENCOUNTER — Other Ambulatory Visit: Payer: Self-pay

## 2019-02-27 DIAGNOSIS — E118 Type 2 diabetes mellitus with unspecified complications: Secondary | ICD-10-CM | POA: Diagnosis not present

## 2019-02-27 DIAGNOSIS — R011 Cardiac murmur, unspecified: Secondary | ICD-10-CM | POA: Diagnosis not present

## 2019-02-27 DIAGNOSIS — F329 Major depressive disorder, single episode, unspecified: Secondary | ICD-10-CM | POA: Diagnosis not present

## 2019-02-27 DIAGNOSIS — Z7982 Long term (current) use of aspirin: Secondary | ICD-10-CM | POA: Insufficient documentation

## 2019-02-27 DIAGNOSIS — Z6841 Body Mass Index (BMI) 40.0 and over, adult: Secondary | ICD-10-CM | POA: Insufficient documentation

## 2019-02-27 DIAGNOSIS — Z79899 Other long term (current) drug therapy: Secondary | ICD-10-CM | POA: Diagnosis not present

## 2019-02-27 DIAGNOSIS — Z01812 Encounter for preprocedural laboratory examination: Secondary | ICD-10-CM | POA: Insufficient documentation

## 2019-02-27 DIAGNOSIS — G4733 Obstructive sleep apnea (adult) (pediatric): Secondary | ICD-10-CM | POA: Insufficient documentation

## 2019-02-27 DIAGNOSIS — I10 Essential (primary) hypertension: Secondary | ICD-10-CM | POA: Diagnosis not present

## 2019-02-27 DIAGNOSIS — Z20828 Contact with and (suspected) exposure to other viral communicable diseases: Secondary | ICD-10-CM | POA: Insufficient documentation

## 2019-02-27 DIAGNOSIS — M797 Fibromyalgia: Secondary | ICD-10-CM | POA: Diagnosis not present

## 2019-02-27 DIAGNOSIS — I2510 Atherosclerotic heart disease of native coronary artery without angina pectoris: Secondary | ICD-10-CM | POA: Insufficient documentation

## 2019-02-27 DIAGNOSIS — Z87891 Personal history of nicotine dependence: Secondary | ICD-10-CM | POA: Diagnosis not present

## 2019-02-27 DIAGNOSIS — E785 Hyperlipidemia, unspecified: Secondary | ICD-10-CM | POA: Diagnosis not present

## 2019-02-27 DIAGNOSIS — G4700 Insomnia, unspecified: Secondary | ICD-10-CM | POA: Diagnosis not present

## 2019-02-27 DIAGNOSIS — K219 Gastro-esophageal reflux disease without esophagitis: Secondary | ICD-10-CM | POA: Diagnosis not present

## 2019-02-27 DIAGNOSIS — F419 Anxiety disorder, unspecified: Secondary | ICD-10-CM | POA: Diagnosis not present

## 2019-02-27 DIAGNOSIS — I252 Old myocardial infarction: Secondary | ICD-10-CM | POA: Diagnosis not present

## 2019-02-27 DIAGNOSIS — R6889 Other general symptoms and signs: Secondary | ICD-10-CM | POA: Diagnosis not present

## 2019-02-27 LAB — CBC
HCT: 38.2 % — ABNORMAL LOW (ref 39.0–52.0)
Hemoglobin: 12 g/dL — ABNORMAL LOW (ref 13.0–17.0)
MCH: 27.4 pg (ref 26.0–34.0)
MCHC: 31.4 g/dL (ref 30.0–36.0)
MCV: 87.2 fL (ref 80.0–100.0)
Platelets: 250 10*3/uL (ref 150–400)
RBC: 4.38 MIL/uL (ref 4.22–5.81)
RDW: 14.8 % (ref 11.5–15.5)
WBC: 5.5 10*3/uL (ref 4.0–10.5)
nRBC: 0 % (ref 0.0–0.2)

## 2019-02-27 LAB — COMPREHENSIVE METABOLIC PANEL
ALT: 15 U/L (ref 0–44)
AST: 20 U/L (ref 15–41)
Albumin: 4.5 g/dL (ref 3.5–5.0)
Alkaline Phosphatase: 67 U/L (ref 38–126)
Anion gap: 10 (ref 5–15)
BUN: 32 mg/dL — ABNORMAL HIGH (ref 8–23)
CO2: 25 mmol/L (ref 22–32)
Calcium: 9 mg/dL (ref 8.9–10.3)
Chloride: 104 mmol/L (ref 98–111)
Creatinine, Ser: 1.03 mg/dL (ref 0.61–1.24)
GFR calc Af Amer: >60 mL/min (ref >60)
GFR calc non Af Amer: >60 mL/min (ref >60)
Glucose, Bld: 107 mg/dL — ABNORMAL HIGH (ref 70–99)
Potassium: 4 mmol/L (ref 3.5–5.1)
Sodium: 139 mmol/L (ref 135–145)
Total Bilirubin: 0.8 mg/dL (ref 0.3–1.2)
Total Protein: 7.3 g/dL (ref 6.5–8.1)

## 2019-02-27 LAB — GLUCOSE, CAPILLARY: Glucose-Capillary: 106 mg/dL — ABNORMAL HIGH (ref 70–99)

## 2019-02-28 ENCOUNTER — Ambulatory Visit: Payer: Self-pay | Admitting: General Surgery

## 2019-02-28 LAB — NOVEL CORONAVIRUS, NAA (HOSP ORDER, SEND-OUT TO REF LAB; TAT 18-24 HRS): SARS-CoV-2, NAA: NOT DETECTED

## 2019-02-28 NOTE — Progress Notes (Signed)
Anesthesia Chart Review   Case: [id number] Date/Time: 03/03/19 0916   Procedure: LAPAROSCOPIC GASTRIC SLEEVE RESECTION, Upper Endo, ERAS Pathway (N/A )   Anesthesia type: General   Pre-op diagnosis: Morbid Obesity, HTN, DM II, CHF, GERD, OSA, Right BKA   Location: [facility] ROOM 02 / [facility]   Surgeon: Greer Pickerel, MD      DISCUSSION:68 y.o. former smoker (quit 08/28/2010) with h/o HLD, HTN, CAD (DES 2008, DES 2009, BMS 2009, patent stents on cath 10/2011 and 01/2013), GERD, OSA w/o device, DM II, treated Hepatitis C, right BKA, morbid obesity scheduled for above procedure 03/03/2019 with Dr. Greer Pickerel.   Cleared by cardiologist 02/17/2019.  Per Fabian Sharp, PA-C, "[name] was last seen in April 2020 by Lyda Jester PAC. Stress test in 03/2018 was low risk and echo with normal EF. He can complete more than 4.0 METS without anginal symptoms. He underwent hip surgery earlier this year without complications. He has a 0.9% risk of a major cardiac complication in the perioperative period. Per Dr. Angelena Form: OK to hold ASA and effient 7 days before procedure.  Therefore, based on ACC/AHA guidelines, the patient would be at acceptable risk for the planned procedure without further cardiovascular testing."  Anticipate pt can proceed with planned procedure barring acute status change.   VS: BP (!) 157/71   Pulse 72   Temp 37.4 C (Oral)   Resp 18   Ht 6' (1.829 m)   Wt (!) 152.8 kg   SpO2 96%   BMI 45.68 kg/m   PROVIDERS: [name], MD is PCP   Lauree Chandler, MD is Cardiologist  LABS: Labs reviewed: Acceptable for surgery. (all labs ordered are listed, but only abnormal results are displayed)  Labs Reviewed  CBC - Abnormal; Notable for the following components:      Result Value   Hemoglobin 12.0 (*)    HCT 38.2 (*)    All other components within normal limits  COMPREHENSIVE METABOLIC PANEL - Abnormal; Notable for the following components:   Glucose, Bld 107 (*)     BUN 32 (*)    All other components within normal limits  GLUCOSE, CAPILLARY - Abnormal; Notable for the following components:   Glucose-Capillary 106 (*)    All other components within normal limits     IMAGES:   EKG: 08/17/2018 Rate 78 bpm Sinus rhythm  Atrial premature complex Probable left atrial enlargement Nonspecific intraventricular conduction delay  CV: Myocardial Perfusion 03/26/2018  Nuclear stress EF: 47%.  There was no ST segment deviation noted during stress.  No T wave inversion was noted during stress.  This is an intermediate risk study due to reduced systolic function. There is no ischemia.  The left ventricular ejection fraction is mildly decreased (45-54%).  Echo 01/09/2018 Study Conclusions  - Left ventricle: The cavity size was normal. Systolic function was   normal. The estimated ejection fraction was in the range of 55%   to 60%. Wall motion was normal; there were no regional wall   motion abnormalities. Doppler parameters are consistent with   abnormal left ventricular relaxation (grade 1 diastolic   dysfunction). - Aortic valve: Transvalvular velocity was within the normal range.   There was no stenosis. There was no regurgitation. - Mitral valve: Transvalvular velocity was within the normal range.   There was no evidence for stenosis. There was trivial   regurgitation. - Right ventricle: The cavity size was normal. Wall thickness was   normal. Systolic function  was normal. - Tricuspid valve: There was trivial regurgitation. - Pulmonary arteries: Systolic pressure was within the normal   range. PA peak pressure: 24 mm Hg (S). Past Medical History:  Diagnosis Date  . ALLERGIC RHINITIS 06/24/2009  . Allergy   . Anxiety 11/12/2011   Adequate for discharge   . Arthritis    "all my joints" (09/30/2013)  . Arthritis of foot, right, degenerative 04/15/2014  . Balance disorder 03/12/2013  . Benign neoplasm of cecum   . Benign neoplasm of  descending colon   . CAD (coronary artery disease) 06/24/2009   5 stents placed in 2007    . Chronic anticoagulation   . Chronic pain syndrome 10/27/2009   of ankle, shoulders, low back.  sciatica.   . Closed fracture of right foot 10/17/2014  . CORONARY ARTERY DISEASE 06/24/2009   a. s/p multiple PCIs - In 2008 he had a Taxus DES to the mild LAD, Endeavor DES to mid LCX and distal LCX. In January 2009 he had DES to distal LCX, mid LCX and proximal LCX. In November 2009 had BMS x 2 to the mid RCA. Cath 10/2011 with patent stents, noncardiac CP. LHC 01/2013: patent stents (noncardiac CP).  . DEGENERATIVE JOINT DISEASE 06/24/2009   Qualifier: Diagnosis of  By: [name], [name]   . Depression   . Depression with anxiety    Prior suicide attempt  . [place] DISEASE, LUMBAR 04/19/2010  . ERECTILE DYSFUNCTION, ORGANIC 05/30/2010  . Essential hypertension 06/24/2009   Qualifier: Diagnosis of  By: [name], [name]   . Fibromyalgia   . Fracture dislocation of ankle joint 09/02/2015  . Gait disorder 03/12/2013  . General weakness 07/14/2014  . GERD (gastroesophageal reflux disease) 09/08/2015  . Hand joint pain 06/10/2013  . Heart murmur   . Hepatitis C    treated pt. unknown with what he was a teenager  . History of kidney stones   . Hyperlipidemia 07/15/2009   Qualifier: Diagnosis of  By: Aundra Dubin, MD, [place]    . HYPERLIPIDEMIA-MIXED 07/15/2009  . HYPERTENSION 06/24/2009  . Insomnia 10/04/2011  . Irregular heart beat   . Left hip pain 03/12/2013   Injected under ultrasound guidance on June 24, 2013   . Major depression 09/13/2015  . Myocardial infarction ([place]) 2008  . Non-cardiac chest pain 10/2011, 01/2013  . Obesity   . Occult blood positive stool 10/17/2014  . Open ankle fracture 09/02/2015  . OSA (obstructive sleep apnea)    not using CPAP (09/30/2013)  . Pain of right thumb 04/03/2013  . Pneumonia   . PPD positive 04/08/2015  . Pre-ulcerative corn or callous 02/06/2013  . Rotator cuff tear arthropathy of  both shoulders 06/10/2013   History of bilateral shoulder cuff surgery for rotator cuff tears. Reports increase in pain 09/11/2015 during physical therapy of the left shoulder.   [name] SCIATICA, LEFT 04/19/2010   Qualifier: Diagnosis of  By: [name], [name]   . Sleep apnea    wears cpap   . Spinal stenosis in cervical region 09/26/2013  . Spinal stenosis, lumbar region, with neurogenic claudication 09/26/2013  . Type II diabetes mellitus ([place]) 2012   no meds in 09/2014.   [name] Uncontrolled type 2 DM with peripheral circulatory disorder ([place]) 10/04/2013  . URETHRAL STRICTURE 06/24/2009   self catheterizes.     Past Surgical History:  Procedure Laterality Date  . AMPUTATION Right 06/14/2016   Procedure: AMPUTATION BELOW KNEE;  Surgeon: Newt Minion, MD;  Location: [place];  Service: Orthopedics;  Laterality: Right;  . ANKLE FUSION Right 04/15/2014   Procedure: Right Subtalar, Talonavicular Fusion;  Surgeon: Newt Minion, MD;  Location: [place];  Service: Orthopedics;  Laterality: Right;  . ANKLE FUSION Right 04/18/2016   Procedure: Right Ankle Tibiocalcaneal Fusion;  Surgeon: Newt Minion, MD;  Location: [place];  Service: Orthopedics;  Laterality: Right;  . ANTERIOR CERVICAL DECOMP/DISCECTOMY FUSION N/A 09/26/2013   Procedure: ANTERIOR CERVICAL DISCECTOMY FUSION C3-4, plate and screw fixation, allograft bone graft;  Surgeon: Jessy Oto, MD;  Location: [place];  Service: Orthopedics;  Laterality: N/A;  . BACK SURGERY    . BELOW KNEE LEG AMPUTATION Right 06/14/2016   right ankle and foot  . CARDIAC CATHETERIZATION  X 1  . CARPAL TUNNEL RELEASE Bilateral   . COLONOSCOPY    . COLONOSCOPY N/A 10/22/2014   Procedure: COLONOSCOPY;  Surgeon: Lafayette Dragon, MD;  Location: [facility] ENDOSCOPY;  Service: Endoscopy;  Laterality: N/A;  . CORONARY ANGIOPLASTY WITH STENT PLACEMENT     "I have 9 stents"  . ESOPHAGOGASTRODUODENOSCOPY N/A 10/19/2014   Procedure: ESOPHAGOGASTRODUODENOSCOPY (EGD);  Surgeon: Jerene Bears, MD;   Location: [facility] ENDOSCOPY;  Service: Endoscopy;  Laterality: N/A;  . FRACTURE SURGERY    . FUSION OF TALONAVICULAR JOINT Right 04/15/2014   dr duda  . HERNIA REPAIR     umbilical  . INGUINAL HERNIA REPAIR Right 05/11/2015   Procedure: LAPAROSCOPIC REPAIR RIGHT  INGUINAL HERNIA;  Surgeon: Greer Pickerel, MD;  Location: [place];  Service: General;  Laterality: Right;  . INSERTION OF MESH Right 05/11/2015   Procedure: INSERTION OF MESH;  Surgeon: Greer Pickerel, MD;  Location: [place];  Service: General;  Laterality: Right;  . JOINT REPLACEMENT    . KNEE CARTILAGE SURGERY Right X 12   "~ 1/2 open; ~ 1/2 scopes"  . KNEE CARTILAGE SURGERY Left X 3   "3 scopes"  . LEFT HEART CATHETERIZATION WITH CORONARY ANGIOGRAM N/A 02/10/2013   Procedure: LEFT HEART CATHETERIZATION WITH CORONARY ANGIOGRAM;  Surgeon: Burnell Blanks, MD;  Location[facility] CATH LAB;  Service: Cardiovascular;  Laterality: N/A;  . LUMBAR LAMINECTOMY N/A 07/16/2018   Procedure: LEFT L4-5 REDO PARTIAL LUMBAR HEMILAMINECTOMY WITH FORAMINOTOMY LEFT L4;  Surgeon: Jessy Oto, MD;  Location: [employer];  Service: Orthopedics;  Laterality: N/A;  . LUMBAR LAMINECTOMY/DECOMPRESSION MICRODISCECTOMY N/A 01/27/2014   Procedure: CENTRAL LUMBAR LAMINECTOMY L4-5 AND L3-4;  Surgeon: Jessy Oto, MD;  Location: [place];  Service: Orthopedics;  Laterality: N/A;  . ORIF ANKLE FRACTURE Right 09/02/2015   Procedure: OPEN REDUCTION INTERNAL FIXATION (ORIF) ANKLE FRACTURE;  Surgeon: Newt Minion, MD;  Location: [place];  Service: Orthopedics;  Laterality: Right;  . PERIPHERALLY INSERTED CENTRAL CATHETER INSERTION  09/02/2015  . POLYPECTOMY    . SHOULDER ARTHROSCOPY W/ ROTATOR CUFF REPAIR Bilateral    "3 on the right; 1 on the left"  . SKIN SPLIT GRAFT Right 10/01/2015   Procedure: RIGHT ANKLE APPLY SKIN GRAFT SPLIT THICKNESS;  Surgeon: Newt Minion, MD;  Location: [place];  Service: Orthopedics;  Laterality: Right;  . TONSILLECTOMY    . TOOTH EXTRACTION    . TOTAL HIP  ARTHROPLASTY Right 04/17/2018  . TOTAL HIP ARTHROPLASTY Right 04/17/2018   Procedure: RIGHT TOTAL HIP ARTHROPLASTY;  Surgeon: Newt Minion, MD;  Location: [place];  Service: Orthopedics;  Laterality: Right;  . TOTAL KNEE ARTHROPLASTY Bilateral 2008  . UMBILICAL HERNIA REPAIR  UHR  . UPPER GASTROINTESTINAL ENDOSCOPY    . URETHRAL DILATION  X 4  . VASECTOMY    . WISDOM TOOTH EXTRACTION      MEDICATIONS: . ARIPiprazole (ABILIFY) 5 MG tablet  . aspirin 81 MG tablet  . atorvastatin (LIPITOR) 20 MG tablet  . benztropine (COGENTIN) 0.5 MG tablet  . celecoxib (CELEBREX) 200 MG capsule  . clonazePAM (KLONOPIN) 0.5 MG tablet  . diclofenac sodium (VOLTAREN) 1 % GEL  . DULoxetine (CYMBALTA) 30 MG capsule  . gabapentin (NEURONTIN) 800 MG tablet  . hydrochlorothiazide (HYDRODIURIL) 25 MG tablet  . Melatonin 5 MG TABS  . methocarbamol (ROBAXIN) 500 MG tablet  . MYRBETRIQ 50 MG TB24 tablet  . Omega-3 Fatty Acids (FISH OIL) 1000 MG CAPS  . oxymetazoline (AFRIN) 0.05 % nasal spray  . Pancrelipase, Lip-Prot-Amyl, (ZENPEP) 45038-88280 units CPEP  . pantoprazole (PROTONIX) 40 MG tablet  . prasugrel (EFFIENT) 5 MG TABS tablet  . sildenafil (VIAGRA) 100 MG tablet  . traZODone (DESYREL) 150 MG tablet   No current facility-administered medications for this encounter.      Maia Plan [facility] Pre-Surgical Testing [phone] 02/28/19  11:15 AM

## 2019-02-28 NOTE — Anesthesia Preprocedure Evaluation (Addendum)
Anesthesia Evaluation  Patient identified by MRN, date of birth, ID band Patient awake    Reviewed: Allergy & Precautions, NPO status , Patient's Chart, lab work & pertinent test results  Airway Mallampati: I       Dental no notable dental hx. (+) Poor Dentition, Missing, Chipped,    Pulmonary former smoker,    Pulmonary exam normal        Cardiovascular hypertension, Pt. on medications Normal cardiovascular exam Rhythm:Regular Rate:Normal     Neuro/Psych PSYCHIATRIC DISORDERS Anxiety Depression    GI/Hepatic GERD  Medicated and Controlled,  Endo/Other  diabetes  Renal/GU      Musculoskeletal   Abdominal (+) + obese,   Peds  Hematology  (+) Blood dyscrasia, anemia ,   Anesthesia Other Findings   Reproductive/Obstetrics                                                             Anesthesia Evaluation  Patient identified by MRN, date of birth, ID band Patient awake    Reviewed: Allergy & Precautions, NPO status , Patient's Chart, lab work & pertinent test results  Airway Mallampati: III  TM Distance: >3 FB Neck ROM: Full    Dental  (+) Missing, Poor Dentition, Chipped   Pulmonary sleep apnea , former smoker,    Pulmonary exam normal breath sounds clear to auscultation       Cardiovascular hypertension, Pt. on medications + CAD, + Past MI, + Cardiac Stents (x 5 in 2007) and + Peripheral Vascular Disease  Normal cardiovascular exam Rhythm:Regular Rate:Normal  ECG: NSR, rate 64  Myocardial perfusion Nuclear stress EF: 47%. There was no ST segment deviation noted during stress. No T wave inversion was noted during stress. This is an intermediate risk study due to reduced systolic function. There is no ischemia. The left ventricular ejection fraction is mildly decreased (45-54%).  ECHO: LV EF: 55% - 60%   Neuro/Psych  Headaches, PSYCHIATRIC DISORDERS Anxiety  Depression  Neuromuscular disease    GI/Hepatic GERD  Medicated and Controlled,(+)     substance abuse  , Hepatitis -, C  Endo/Other  diabetesMorbid obesity  Renal/GU negative Renal ROS     Musculoskeletal  (+) Arthritis , Fibromyalgia -, narcotic dependentChronic pain syndrome   Abdominal (+) + obese,   Peds  Hematology HLD   Anesthesia Other Findings recurrent spinal stenosis L4-5 with left L4 foraminal stenosis  Reproductive/Obstetrics                          Anesthesia Physical Anesthesia Plan  ASA: III  Anesthesia Plan: General   Post-op Pain Management:    Induction: Intravenous  PONV Risk Score and Plan: 3 and Midazolam, Dexamethasone, Ondansetron and Treatment may vary due to age or medical condition  Airway Management Planned: Oral ETT  Additional Equipment:   Intra-op Plan:   Post-operative Plan: Extubation in OR  Informed Consent: I have reviewed the patients History and Physical, chart, labs and discussed the procedure including the risks, benefits and alternatives for the proposed anesthesia with the patient or authorized representative who has indicated his/her understanding and acceptance.     Dental advisory given  Plan Discussed with: CRNA  Anesthesia Plan Comments: (Reviewed previous note by Karoline Caldwell, PA-C 04/11/18.  Pt cleared by cardiology for lumbar surgery. Per Dr. Camillia Herter note 07/01/18 "He is overall limited in mobility due to his obesity and back pain. He has no concerning symptoms suggestive of angina, arrhythmias or CHF. Stress test November 2019 with no ischemia. He can proceed with his planned surgical procedure. He can hold his ASA and Effient 5 days before his planned procedure.")       Anesthesia Quick Evaluation  Anesthesia Physical Anesthesia Plan  ASA: III  Anesthesia Plan: General   Post-op Pain Management:    Induction: Intravenous  PONV Risk Score and Plan: Ondansetron and  Dexamethasone  Airway Management Planned: Oral ETT  Additional Equipment: None  Intra-op Plan:   Post-operative Plan:   Informed Consent: I have reviewed the patients History and Physical, chart, labs and discussed the procedure including the risks, benefits and alternatives for the proposed anesthesia with the patient or authorized representative who has indicated his/her understanding and acceptance.     Dental advisory given  Plan Discussed with: CRNA  Anesthesia Plan Comments: (See PAT note 02/27/2019, Konrad Felix, PA-C)       Anesthesia Quick Evaluation                                  Anesthesia Evaluation  Patient identified by MRN, date of birth, ID band Patient awake    Reviewed: Allergy & Precautions, NPO status , Patient's Chart, lab work & pertinent test results  Airway Mallampati: III  TM Distance: >3 FB Neck ROM: Full    Dental  (+) Missing, Poor Dentition, Chipped   Pulmonary sleep apnea , former smoker,    Pulmonary exam normal breath sounds clear to auscultation       Cardiovascular hypertension, Pt. on medications + CAD, + Past MI, + Cardiac Stents (x 5 in 2007) and + Peripheral Vascular Disease  Normal cardiovascular exam Rhythm:Regular Rate:Normal  ECG: NSR, rate 64  Myocardial perfusion Nuclear stress EF: 47%. There was no ST segment deviation noted during stress. No T wave inversion was noted during stress. This is an intermediate risk study due to reduced systolic function. There is no ischemia. The left ventricular ejection fraction is mildly decreased (45-54%).  ECHO: LV EF: 55% - 60%   Neuro/Psych  Headaches, PSYCHIATRIC DISORDERS Anxiety Depression  Neuromuscular disease    GI/Hepatic GERD  Medicated and Controlled,(+)     substance abuse  , Hepatitis -, C  Endo/Other  diabetesMorbid obesity  Renal/GU negative Renal ROS     Musculoskeletal  (+) Arthritis , Fibromyalgia -, narcotic dependentChronic  pain syndrome   Abdominal (+) + obese,   Peds  Hematology HLD   Anesthesia Other Findings recurrent spinal stenosis L4-5 with left L4 foraminal stenosis  Reproductive/Obstetrics                          Anesthesia Physical Anesthesia Plan  ASA: III  Anesthesia Plan: General   Post-op Pain Management:    Induction: Intravenous  PONV Risk Score and Plan: 3 and Midazolam, Dexamethasone, Ondansetron and Treatment may vary due to age or medical condition  Airway Management Planned: Oral ETT  Additional Equipment:   Intra-op Plan:   Post-operative Plan: Extubation in OR  Informed Consent: I have reviewed the patients History and Physical, chart, labs and discussed the procedure including the risks, benefits and alternatives for the proposed anesthesia with the patient or authorized  representative who has indicated his/her understanding and acceptance.     Dental advisory given  Plan Discussed with: CRNA  Anesthesia Plan Comments: (Reviewed previous note by Karoline Caldwell, PA-C 04/11/18. Pt cleared by cardiology for lumbar surgery. Per Dr. Camillia Herter note 07/01/18 "He is overall limited in mobility due to his obesity and back pain. He has no concerning symptoms suggestive of angina, arrhythmias or CHF. Stress test November 2019 with no ischemia. He can proceed with his planned surgical procedure. He can hold his ASA and Effient 5 days before his planned procedure.")       Anesthesia Quick Evaluation                                   Anesthesia Evaluation  Patient identified by MRN, date of birth, ID band Patient awake    Reviewed: Allergy & Precautions, H&P , NPO status , Patient's Chart, lab work & pertinent test results  Airway Mallampati: III  TM Distance: >3 FB Neck ROM: Full    Dental no notable dental hx. (+) Teeth Intact, Dental Advisory Given   Pulmonary sleep apnea , former smoker,    Pulmonary exam normal breath sounds clear to  auscultation       Cardiovascular hypertension, Pt. on medications + CAD, + Past MI, + Cardiac Stents and + Peripheral Vascular Disease   Rhythm:Regular Rate:Normal     Neuro/Psych  Headaches, Anxiety Depression negative neurological ROS  negative psych ROS   GI/Hepatic negative GI ROS, Neg liver ROS,   Endo/Other  diabetes, Type 2, Oral Hypoglycemic Agents  Renal/GU negative Renal ROS  negative genitourinary   Musculoskeletal  (+) Arthritis , Osteoarthritis,    Abdominal   Peds  Hematology negative hematology ROS (+) anemia ,   Anesthesia Other Findings   Reproductive/Obstetrics negative OB ROS                             Anesthesia Physical Anesthesia Plan  ASA: III  Anesthesia Plan: General   Post-op Pain Management:    Induction: Intravenous  Airway Management Planned: LMA  Additional Equipment:   Intra-op Plan:   Post-operative Plan: Extubation in OR  Informed Consent: I have reviewed the patients History and Physical, chart, labs and discussed the procedure including the risks, benefits and alternatives for the proposed anesthesia with the patient or authorized representative who has indicated his/her understanding and acceptance.   Dental advisory given  Plan Discussed with: CRNA  Anesthesia Plan Comments:         Anesthesia Quick Evaluation

## 2019-03-01 DIAGNOSIS — G4733 Obstructive sleep apnea (adult) (pediatric): Secondary | ICD-10-CM | POA: Diagnosis not present

## 2019-03-03 ENCOUNTER — Inpatient Hospital Stay (HOSPITAL_COMMUNITY)
Admission: RE | Admit: 2019-03-03 | Discharge: 2019-03-05 | DRG: 621 | Disposition: A | Discharge status: Home Health | Payer: Medicare HMO | Attending: General Surgery | Admitting: General Surgery

## 2019-03-03 ENCOUNTER — Encounter (HOSPITAL_COMMUNITY): Payer: Self-pay

## 2019-03-03 ENCOUNTER — Inpatient Hospital Stay (HOSPITAL_COMMUNITY): Payer: Medicare HMO | Admitting: Physician Assistant

## 2019-03-03 ENCOUNTER — Encounter (HOSPITAL_COMMUNITY)
Admission: RE | Disposition: A | Discharge status: Home Health | Payer: Self-pay | Source: Home / Self Care | Attending: General Surgery

## 2019-03-03 ENCOUNTER — Inpatient Hospital Stay (HOSPITAL_COMMUNITY): Payer: Medicare HMO | Admitting: Certified Registered Nurse Anesthetist

## 2019-03-03 ENCOUNTER — Other Ambulatory Visit: Payer: Self-pay

## 2019-03-03 DIAGNOSIS — I10 Essential (primary) hypertension: Secondary | ICD-10-CM | POA: Diagnosis not present

## 2019-03-03 DIAGNOSIS — Z981 Arthrodesis status: Secondary | ICD-10-CM

## 2019-03-03 DIAGNOSIS — K219 Gastro-esophageal reflux disease without esophagitis: Secondary | ICD-10-CM | POA: Diagnosis present

## 2019-03-03 DIAGNOSIS — M797 Fibromyalgia: Secondary | ICD-10-CM | POA: Diagnosis present

## 2019-03-03 DIAGNOSIS — Z7901 Long term (current) use of anticoagulants: Secondary | ICD-10-CM

## 2019-03-03 DIAGNOSIS — Z818 Family history of other mental and behavioral disorders: Secondary | ICD-10-CM

## 2019-03-03 DIAGNOSIS — G4733 Obstructive sleep apnea (adult) (pediatric): Secondary | ICD-10-CM | POA: Diagnosis not present

## 2019-03-03 DIAGNOSIS — M51369 Other intervertebral disc degeneration, lumbar region without mention of lumbar back pain or lower extremity pain: Secondary | ICD-10-CM | POA: Diagnosis present

## 2019-03-03 DIAGNOSIS — Z79890 Hormone replacement therapy (postmenopausal): Secondary | ICD-10-CM | POA: Diagnosis not present

## 2019-03-03 DIAGNOSIS — E119 Type 2 diabetes mellitus without complications: Secondary | ICD-10-CM | POA: Diagnosis not present

## 2019-03-03 DIAGNOSIS — Z87442 Personal history of urinary calculi: Secondary | ICD-10-CM

## 2019-03-03 DIAGNOSIS — I2510 Atherosclerotic heart disease of native coronary artery without angina pectoris: Secondary | ICD-10-CM | POA: Diagnosis present

## 2019-03-03 DIAGNOSIS — R6889 Other general symptoms and signs: Secondary | ICD-10-CM | POA: Diagnosis not present

## 2019-03-03 DIAGNOSIS — I252 Old myocardial infarction: Secondary | ICD-10-CM

## 2019-03-03 DIAGNOSIS — F419 Anxiety disorder, unspecified: Secondary | ICD-10-CM | POA: Diagnosis present

## 2019-03-03 DIAGNOSIS — E782 Mixed hyperlipidemia: Secondary | ICD-10-CM | POA: Diagnosis present

## 2019-03-03 DIAGNOSIS — Z6841 Body Mass Index (BMI) 40.0 and over, adult: Secondary | ICD-10-CM

## 2019-03-03 DIAGNOSIS — Z955 Presence of coronary angioplasty implant and graft: Secondary | ICD-10-CM | POA: Diagnosis not present

## 2019-03-03 DIAGNOSIS — Z79891 Long term (current) use of opiate analgesic: Secondary | ICD-10-CM | POA: Diagnosis not present

## 2019-03-03 DIAGNOSIS — G894 Chronic pain syndrome: Secondary | ICD-10-CM | POA: Diagnosis present

## 2019-03-03 DIAGNOSIS — Z833 Family history of diabetes mellitus: Secondary | ICD-10-CM

## 2019-03-03 DIAGNOSIS — K4090 Unilateral inguinal hernia, without obstruction or gangrene, not specified as recurrent: Secondary | ICD-10-CM | POA: Diagnosis present

## 2019-03-03 DIAGNOSIS — Z87891 Personal history of nicotine dependence: Secondary | ICD-10-CM

## 2019-03-03 DIAGNOSIS — F319 Bipolar disorder, unspecified: Secondary | ICD-10-CM | POA: Diagnosis present

## 2019-03-03 DIAGNOSIS — M5136 Other intervertebral disc degeneration, lumbar region: Secondary | ICD-10-CM | POA: Diagnosis present

## 2019-03-03 DIAGNOSIS — M5137 Other intervertebral disc degeneration, lumbosacral region: Secondary | ICD-10-CM | POA: Diagnosis present

## 2019-03-03 DIAGNOSIS — Z8349 Family history of other endocrine, nutritional and metabolic diseases: Secondary | ICD-10-CM

## 2019-03-03 DIAGNOSIS — Z8249 Family history of ischemic heart disease and other diseases of the circulatory system: Secondary | ICD-10-CM

## 2019-03-03 DIAGNOSIS — Z9884 Bariatric surgery status: Secondary | ICD-10-CM

## 2019-03-03 DIAGNOSIS — Z7982 Long term (current) use of aspirin: Secondary | ICD-10-CM

## 2019-03-03 DIAGNOSIS — E1169 Type 2 diabetes mellitus with other specified complication: Secondary | ICD-10-CM | POA: Diagnosis present

## 2019-03-03 DIAGNOSIS — Z96653 Presence of artificial knee joint, bilateral: Secondary | ICD-10-CM | POA: Diagnosis present

## 2019-03-03 DIAGNOSIS — E669 Obesity, unspecified: Secondary | ICD-10-CM | POA: Diagnosis present

## 2019-03-03 DIAGNOSIS — Z79899 Other long term (current) drug therapy: Secondary | ICD-10-CM

## 2019-03-03 DIAGNOSIS — Z89511 Acquired absence of right leg below knee: Secondary | ICD-10-CM

## 2019-03-03 DIAGNOSIS — Z96641 Presence of right artificial hip joint: Secondary | ICD-10-CM | POA: Diagnosis present

## 2019-03-03 HISTORY — PX: LAPAROSCOPIC GASTRIC SLEEVE RESECTION: SHX5895

## 2019-03-03 LAB — TYPE AND SCREEN
ABO/RH(D): A POS
Antibody Screen: NEGATIVE

## 2019-03-03 LAB — HEMOGLOBIN AND HEMATOCRIT, BLOOD
HCT: 43 % (ref 39.0–52.0)
Hemoglobin: 13.3 g/dL (ref 13.0–17.0)

## 2019-03-03 LAB — GLUCOSE, CAPILLARY
Glucose-Capillary: 133 mg/dL — ABNORMAL HIGH (ref 70–99)
Glucose-Capillary: 174 mg/dL — ABNORMAL HIGH (ref 70–99)
Glucose-Capillary: 152 mg/dL — ABNORMAL HIGH (ref 70–99)
Glucose-Capillary: 145 mg/dL — ABNORMAL HIGH (ref 70–99)

## 2019-03-03 SURGERY — GASTRECTOMY, SLEEVE, LAPAROSCOPIC
Anesthesia: General | Site: Abdomen

## 2019-03-03 MED ORDER — ONDANSETRON HCL 4 MG/2ML IJ SOLN
INTRAMUSCULAR | Status: DC | PRN
  Administered 2019-03-03: 4 mg via INTRAVENOUS

## 2019-03-03 MED ORDER — FENTANYL CITRATE (PF) 100 MCG/2ML IJ SOLN
INTRAMUSCULAR | Status: AC
  Filled 2019-03-03: qty 2

## 2019-03-03 MED ORDER — ONDANSETRON HCL 4 MG/2ML IJ SOLN
INTRAMUSCULAR | Status: AC
  Filled 2019-03-03: qty 2

## 2019-03-03 MED ORDER — PROMETHAZINE HCL 25 MG/ML IJ SOLN: 12.5000 mg | Freq: Four times a day (QID) | INTRAMUSCULAR | Status: DC | PRN

## 2019-03-03 MED ORDER — MORPHINE SULFATE (PF) 2 MG/ML IV SOLN
1.0000 mg | INTRAVENOUS | Status: DC | PRN
  Administered 2019-03-03 (×2): 2 mg via INTRAVENOUS
  Filled 2019-03-03 (×2): qty 1

## 2019-03-03 MED ORDER — METOPROLOL TARTRATE 5 MG/5ML IV SOLN
5.0000 mg | Freq: Four times a day (QID) | INTRAVENOUS | Status: DC | PRN
  Filled 2019-03-03: qty 5

## 2019-03-03 MED ORDER — HEPARIN SODIUM (PORCINE) 5000 UNIT/ML IJ SOLN
5000.0000 [IU] | INTRAMUSCULAR | Status: AC
  Administered 2019-03-03: 5000 [IU] via SUBCUTANEOUS
  Filled 2019-03-03: qty 1

## 2019-03-03 MED ORDER — PROMETHAZINE HCL 25 MG/ML IJ SOLN: 6.2500 mg | INTRAMUSCULAR | Status: DC | PRN

## 2019-03-03 MED ORDER — PROPOFOL 10 MG/ML IV BOLUS
INTRAVENOUS | Status: DC | PRN
  Administered 2019-03-03: 200 mg via INTRAVENOUS

## 2019-03-03 MED ORDER — LACTATED RINGERS IR SOLN
Status: DC | PRN
  Administered 2019-03-03: 1000 mL

## 2019-03-03 MED ORDER — SODIUM CHLORIDE (PF) 0.9 % IJ SOLN
INTRAMUSCULAR | Status: DC | PRN
  Administered 2019-03-03: 50 mL

## 2019-03-03 MED ORDER — APREPITANT 40 MG PO CAPS
40.0000 mg | ORAL_CAPSULE | ORAL | Status: AC
  Administered 2019-03-03: 40 mg via ORAL
  Filled 2019-03-03: qty 1

## 2019-03-03 MED ORDER — BENZTROPINE MESYLATE 0.5 MG PO TABS
0.5000 mg | ORAL_TABLET | Freq: Every day | ORAL | Status: DC
  Administered 2019-03-03 – 2019-03-04 (×2): 0.5 mg via ORAL
  Filled 2019-03-03 (×2): qty 1

## 2019-03-03 MED ORDER — DEXAMETHASONE SODIUM PHOSPHATE 10 MG/ML IJ SOLN
INTRAMUSCULAR | Status: AC
  Filled 2019-03-03: qty 1

## 2019-03-03 MED ORDER — SUGAMMADEX SODIUM 200 MG/2ML IV SOLN
INTRAVENOUS | Status: DC | PRN
  Administered 2019-03-03: 300 mg via INTRAVENOUS

## 2019-03-03 MED ORDER — GLYCOPYRROLATE PF 0.2 MG/ML IJ SOSY
PREFILLED_SYRINGE | INTRAMUSCULAR | Status: DC | PRN
  Administered 2019-03-03: .2 mg via INTRAVENOUS

## 2019-03-03 MED ORDER — SODIUM CHLORIDE 0.9 % IV SOLN
2.0000 g | INTRAVENOUS | Status: AC
  Administered 2019-03-03: 2 g via INTRAVENOUS
  Filled 2019-03-03: qty 2

## 2019-03-03 MED ORDER — ARIPIPRAZOLE 10 MG PO TABS
5.0000 mg | ORAL_TABLET | Freq: Every day | ORAL | Status: DC
  Administered 2019-03-03 – 2019-03-04 (×2): 5 mg via ORAL
  Filled 2019-03-03 (×2): qty 1

## 2019-03-03 MED ORDER — EPHEDRINE SULFATE-NACL 50-0.9 MG/10ML-% IV SOSY
PREFILLED_SYRINGE | INTRAVENOUS | Status: DC | PRN
  Administered 2019-03-03 (×3): 5 mg via INTRAVENOUS
  Administered 2019-03-03: 10 mg via INTRAVENOUS

## 2019-03-03 MED ORDER — SUCCINYLCHOLINE CHLORIDE 200 MG/10ML IV SOSY
PREFILLED_SYRINGE | INTRAVENOUS | Status: DC | PRN
  Administered 2019-03-03: 160 mg via INTRAVENOUS

## 2019-03-03 MED ORDER — MIDAZOLAM HCL 5 MG/5ML IJ SOLN
INTRAMUSCULAR | Status: DC | PRN
  Administered 2019-03-03: 1 mg via INTRAVENOUS

## 2019-03-03 MED ORDER — PROPOFOL 10 MG/ML IV BOLUS
INTRAVENOUS | Status: AC
  Filled 2019-03-03: qty 20

## 2019-03-03 MED ORDER — INSULIN ASPART 100 UNIT/ML ~~LOC~~ SOLN
0.0000 [IU] | SUBCUTANEOUS | Status: DC
  Administered 2019-03-03: 4 [IU] via SUBCUTANEOUS
  Administered 2019-03-03: 3 [IU] via SUBCUTANEOUS
  Administered 2019-03-03: 4 [IU] via SUBCUTANEOUS
  Administered 2019-03-04 (×2): 3 [IU] via SUBCUTANEOUS

## 2019-03-03 MED ORDER — LACTATED RINGERS IV SOLN
INTRAVENOUS | Status: DC
  Administered 2019-03-03 (×2): via INTRAVENOUS

## 2019-03-03 MED ORDER — STERILE WATER FOR IRRIGATION IR SOLN
Status: DC | PRN
  Administered 2019-03-03: 1000 mL

## 2019-03-03 MED ORDER — DICLOFENAC SODIUM 1 % TD GEL
4.0000 g | Freq: Three times a day (TID) | TRANSDERMAL | Status: DC | PRN
  Filled 2019-03-03: qty 100

## 2019-03-03 MED ORDER — ACETAMINOPHEN 500 MG PO TABS: 1000.0000 mg | ORAL_TABLET | Freq: Three times a day (TID) | ORAL | Status: DC

## 2019-03-03 MED ORDER — DIPHENHYDRAMINE HCL 50 MG/ML IJ SOLN: 12.5000 mg | Freq: Three times a day (TID) | INTRAMUSCULAR | Status: DC | PRN

## 2019-03-03 MED ORDER — ACETAMINOPHEN 500 MG PO TABS
1000.0000 mg | ORAL_TABLET | ORAL | Status: AC
  Administered 2019-03-03: 1000 mg via ORAL
  Filled 2019-03-03: qty 2

## 2019-03-03 MED ORDER — ENSURE MAX PROTEIN PO LIQD
2.0000 [oz_av] | ORAL | Status: DC
  Administered 2019-03-04 – 2019-03-05 (×15): 2 [oz_av] via ORAL

## 2019-03-03 MED ORDER — MEPERIDINE HCL 50 MG/ML IJ SOLN: 6.2500 mg | INTRAMUSCULAR | Status: DC | PRN

## 2019-03-03 MED ORDER — ROCURONIUM BROMIDE 50 MG/5ML IV SOSY
PREFILLED_SYRINGE | INTRAVENOUS | Status: DC | PRN
  Administered 2019-03-03: 60 mg via INTRAVENOUS
  Administered 2019-03-03 (×2): 20 mg via INTRAVENOUS

## 2019-03-03 MED ORDER — DEXAMETHASONE SODIUM PHOSPHATE 10 MG/ML IJ SOLN
INTRAMUSCULAR | Status: DC | PRN
  Administered 2019-03-03: 8 mg via INTRAVENOUS

## 2019-03-03 MED ORDER — LIDOCAINE 2% (20 MG/ML) 5 ML SYRINGE
INTRAMUSCULAR | Status: DC | PRN
  Administered 2019-03-03: 1 mg/kg/h via INTRAVENOUS

## 2019-03-03 MED ORDER — EPHEDRINE 5 MG/ML INJ
INTRAVENOUS | Status: AC
  Filled 2019-03-03: qty 10

## 2019-03-03 MED ORDER — LIDOCAINE 2% (20 MG/ML) 5 ML SYRINGE
INTRAMUSCULAR | Status: DC | PRN
  Administered 2019-03-03: 100 mg via INTRAVENOUS

## 2019-03-03 MED ORDER — FENTANYL CITRATE (PF) 250 MCG/5ML IJ SOLN
INTRAMUSCULAR | Status: AC
  Filled 2019-03-03: qty 5

## 2019-03-03 MED ORDER — SUCCINYLCHOLINE CHLORIDE 200 MG/10ML IV SOSY
PREFILLED_SYRINGE | INTRAVENOUS | Status: AC
  Filled 2019-03-03: qty 10

## 2019-03-03 MED ORDER — SCOPOLAMINE 1 MG/3DAYS TD PT72
1.0000 | MEDICATED_PATCH | TRANSDERMAL | Status: DC
  Administered 2019-03-03: 1.5 mg via TRANSDERMAL
  Filled 2019-03-03: qty 1

## 2019-03-03 MED ORDER — GABAPENTIN 300 MG PO CAPS
300.0000 mg | ORAL_CAPSULE | ORAL | Status: AC
  Administered 2019-03-03: 300 mg via ORAL
  Filled 2019-03-03: qty 1

## 2019-03-03 MED ORDER — HYDROCHLOROTHIAZIDE 25 MG PO TABS
25.0000 mg | ORAL_TABLET | Freq: Every day | ORAL | Status: DC
  Administered 2019-03-03 – 2019-03-04 (×2): 25 mg via ORAL
  Filled 2019-03-03 (×2): qty 1

## 2019-03-03 MED ORDER — ROCURONIUM BROMIDE 10 MG/ML (PF) SYRINGE
PREFILLED_SYRINGE | INTRAVENOUS | Status: AC
  Filled 2019-03-03: qty 10

## 2019-03-03 MED ORDER — CHLORHEXIDINE GLUCONATE 4 % EX LIQD: 60.0000 mL | Freq: Once | CUTANEOUS | Status: DC

## 2019-03-03 MED ORDER — GABAPENTIN 400 MG PO CAPS
800.0000 mg | ORAL_CAPSULE | Freq: Three times a day (TID) | ORAL | Status: DC
  Administered 2019-03-03 – 2019-03-05 (×6): 800 mg via ORAL
  Filled 2019-03-03 (×6): qty 2

## 2019-03-03 MED ORDER — SUGAMMADEX SODIUM 500 MG/5ML IV SOLN
INTRAVENOUS | Status: AC
  Filled 2019-03-03: qty 5

## 2019-03-03 MED ORDER — BUPIVACAINE LIPOSOME 1.3 % IJ SUSP
20.0000 mL | Freq: Once | INTRAMUSCULAR | Status: DC
  Filled 2019-03-03: qty 20

## 2019-03-03 MED ORDER — 0.9 % SODIUM CHLORIDE (POUR BTL) OPTIME
TOPICAL | Status: DC | PRN
  Administered 2019-03-03: 1000 mL

## 2019-03-03 MED ORDER — BUPIVACAINE LIPOSOME 1.3 % IJ SUSP
INTRAMUSCULAR | Status: DC | PRN
  Administered 2019-03-03: 20 mL

## 2019-03-03 MED ORDER — LABETALOL HCL 5 MG/ML IV SOLN
INTRAVENOUS | Status: DC | PRN
  Administered 2019-03-03: 5 mg via INTRAVENOUS

## 2019-03-03 MED ORDER — DEXAMETHASONE SODIUM PHOSPHATE 4 MG/ML IJ SOLN: 4.0000 mg | INTRAMUSCULAR | Status: DC

## 2019-03-03 MED ORDER — ONDANSETRON HCL 4 MG/2ML IJ SOLN
4.0000 mg | Freq: Four times a day (QID) | INTRAMUSCULAR | Status: DC | PRN
  Administered 2019-03-03 – 2019-03-04 (×3): 4 mg via INTRAVENOUS
  Filled 2019-03-03 (×3): qty 2

## 2019-03-03 MED ORDER — OXYCODONE HCL 5 MG/5ML PO SOLN
5.0000 mg | Freq: Four times a day (QID) | ORAL | Status: DC | PRN
  Administered 2019-03-04 (×4): 5 mg via ORAL
  Filled 2019-03-03 (×4): qty 5

## 2019-03-03 MED ORDER — FENTANYL CITRATE (PF) 100 MCG/2ML IJ SOLN
25.0000 ug | INTRAMUSCULAR | Status: DC | PRN
  Administered 2019-03-03: 50 ug via INTRAVENOUS

## 2019-03-03 MED ORDER — SODIUM CHLORIDE (PF) 0.9 % IJ SOLN
INTRAMUSCULAR | Status: AC
  Filled 2019-03-03: qty 50

## 2019-03-03 MED ORDER — MIDAZOLAM HCL 2 MG/2ML IJ SOLN
INTRAMUSCULAR | Status: AC
  Filled 2019-03-03: qty 2

## 2019-03-03 MED ORDER — KETAMINE HCL 10 MG/ML IJ SOLN
INTRAMUSCULAR | Status: DC | PRN
  Administered 2019-03-03: 20 mg via INTRAVENOUS

## 2019-03-03 MED ORDER — DULOXETINE HCL 30 MG PO CPEP
30.0000 mg | ORAL_CAPSULE | Freq: Two times a day (BID) | ORAL | Status: DC
  Administered 2019-03-04 – 2019-03-05 (×3): 30 mg via ORAL
  Filled 2019-03-03 (×3): qty 1

## 2019-03-03 MED ORDER — METHOCARBAMOL 500 MG PO TABS
500.0000 mg | ORAL_TABLET | Freq: Three times a day (TID) | ORAL | Status: DC | PRN
  Administered 2019-03-03 – 2019-03-05 (×2): 500 mg via ORAL
  Filled 2019-03-03 (×2): qty 1

## 2019-03-03 MED ORDER — SIMETHICONE 80 MG PO CHEW: 80.0000 mg | CHEWABLE_TABLET | Freq: Four times a day (QID) | ORAL | Status: DC | PRN

## 2019-03-03 MED ORDER — KETOROLAC TROMETHAMINE 30 MG/ML IJ SOLN: 30.0000 mg | Freq: Once | INTRAMUSCULAR | Status: DC | PRN

## 2019-03-03 MED ORDER — ENOXAPARIN SODIUM 30 MG/0.3ML ~~LOC~~ SOLN
30.0000 mg | Freq: Two times a day (BID) | SUBCUTANEOUS | Status: DC
  Administered 2019-03-03 – 2019-03-05 (×4): 30 mg via SUBCUTANEOUS
  Filled 2019-03-03 (×4): qty 0.3

## 2019-03-03 MED ORDER — MIRABEGRON ER 25 MG PO TB24
50.0000 mg | ORAL_TABLET | Freq: Every day | ORAL | Status: DC
  Administered 2019-03-03 – 2019-03-04 (×2): 50 mg via ORAL
  Filled 2019-03-03 (×3): qty 2

## 2019-03-03 MED ORDER — LIDOCAINE 2% (20 MG/ML) 5 ML SYRINGE
INTRAMUSCULAR | Status: AC
  Filled 2019-03-03: qty 5

## 2019-03-03 MED ORDER — LABETALOL HCL 5 MG/ML IV SOLN
INTRAVENOUS | Status: AC
  Filled 2019-03-03: qty 4

## 2019-03-03 MED ORDER — ACETAMINOPHEN 160 MG/5ML PO SOLN
1000.0000 mg | Freq: Three times a day (TID) | ORAL | Status: DC
  Administered 2019-03-03 – 2019-03-05 (×5): 1000 mg via ORAL
  Filled 2019-03-03 (×5): qty 40.6

## 2019-03-03 MED ORDER — FENTANYL CITRATE (PF) 250 MCG/5ML IJ SOLN
INTRAMUSCULAR | Status: DC | PRN
  Administered 2019-03-03: 100 ug via INTRAVENOUS
  Administered 2019-03-03: 50 ug via INTRAVENOUS

## 2019-03-03 MED ORDER — CLONAZEPAM 0.5 MG PO TABS
0.5000 mg | ORAL_TABLET | Freq: Every day | ORAL | Status: DC
  Administered 2019-03-03 – 2019-03-04 (×2): 0.5 mg via ORAL
  Filled 2019-03-03 (×2): qty 1

## 2019-03-03 MED ORDER — PANTOPRAZOLE SODIUM 40 MG IV SOLR
40.0000 mg | Freq: Every day | INTRAVENOUS | Status: DC
  Administered 2019-03-03 – 2019-03-04 (×2): 40 mg via INTRAVENOUS
  Filled 2019-03-03 (×2): qty 40

## 2019-03-03 MED ORDER — POTASSIUM CHLORIDE IN NACL 20-0.45 MEQ/L-% IV SOLN
INTRAVENOUS | Status: DC
  Administered 2019-03-03 – 2019-03-04 (×4): via INTRAVENOUS
  Filled 2019-03-03 (×7): qty 1000

## 2019-03-03 SURGICAL SUPPLY — 79 items
APL PRP STRL LF DISP 70% ISPRP (MISCELLANEOUS) ×2
APL SKNCLS STERI-STRIP NONHPOA (GAUZE/BANDAGES/DRESSINGS) ×1
APL SRG 32X5 SNPLK LF DISP (MISCELLANEOUS)
APL SWBSTK 6 STRL LF DISP (MISCELLANEOUS) ×1
APPLICATOR COTTON TIP 6 STRL (MISCELLANEOUS) IMPLANT
APPLICATOR COTTON TIP 6IN STRL (MISCELLANEOUS) ×2
APPLIER CLIP ROT 10 11.4 M/L (STAPLE)
APPLIER CLIP ROT 13.4 12 LRG (CLIP) ×2
APR CLP LRG 13.4X12 ROT 20 MLT (CLIP) ×1
APR CLP MED LRG 11.4X10 (STAPLE)
BENZOIN TINCTURE PRP APPL 2/3 (GAUZE/BANDAGES/DRESSINGS) ×2 IMPLANT
BLADE SURG SZ11 CARB STEEL (BLADE) ×2 IMPLANT
BNDG ADH 1X3 SHEER STRL LF (GAUZE/BANDAGES/DRESSINGS) ×7 IMPLANT
BNDG ADH THN 3X1 STRL LF (GAUZE/BANDAGES/DRESSINGS) ×1
CHLORAPREP W/TINT 26 (MISCELLANEOUS) ×4 IMPLANT
CLIP APPLIE ROT 10 11.4 M/L (STAPLE) IMPLANT
CLIP APPLIE ROT 13.4 12 LRG (CLIP) IMPLANT
COVER SURGICAL LIGHT HANDLE (MISCELLANEOUS) ×2 IMPLANT
COVER WAND RF STERILE (DRAPES) IMPLANT
DECANTER SPIKE VIAL GLASS SM (MISCELLANEOUS) ×2 IMPLANT
DEVICE SUT QUICK LOAD TK 5 (STAPLE) IMPLANT
DEVICE SUT TI-KNOT TK 5X26 (MISCELLANEOUS) IMPLANT
DEVICE SUTURE ENDOST 10MM (ENDOMECHANICALS) IMPLANT
DISSECTOR BLUNT TIP ENDO 5MM (MISCELLANEOUS) IMPLANT
DRAPE UTILITY XL STRL (DRAPES) ×4 IMPLANT
ELECT L-HOOK LAP 45CM DISP (ELECTROSURGICAL)
ELECT PENCIL ROCKER SW 15FT (MISCELLANEOUS) IMPLANT
ELECT REM PT RETURN 15FT ADLT (MISCELLANEOUS) ×2 IMPLANT
ELECTRODE L-HOOK LAP 45CM DISP (ELECTROSURGICAL) IMPLANT
GAUZE SPONGE 2X2 8PLY STRL LF (GAUZE/BANDAGES/DRESSINGS) IMPLANT
GAUZE SPONGE 4X4 12PLY STRL (GAUZE/BANDAGES/DRESSINGS) IMPLANT
GLOVE BIO SURGEON STRL SZ7.5 (GLOVE) ×2 IMPLANT
GLOVE INDICATOR 8.0 STRL GRN (GLOVE) ×2 IMPLANT
GOWN STRL REUS W/TWL XL LVL3 (GOWN DISPOSABLE) ×6 IMPLANT
GRASPER SUT TROCAR 14GX15 (MISCELLANEOUS) ×2 IMPLANT
HOVERMATT SINGLE USE (MISCELLANEOUS) ×2 IMPLANT
KIT BASIN OR (CUSTOM PROCEDURE TRAY) ×2 IMPLANT
KIT TURNOVER KIT A (KITS) IMPLANT
MARKER SKIN DUAL TIP RULER LAB (MISCELLANEOUS) ×2 IMPLANT
NDL SPNL 22GX3.5 QUINCKE BK (NEEDLE) ×1 IMPLANT
NEEDLE SPNL 22GX3.5 QUINCKE BK (NEEDLE) ×2 IMPLANT
PACK UNIVERSAL I (CUSTOM PROCEDURE TRAY) ×2 IMPLANT
PENCIL SMOKE EVACUATOR (MISCELLANEOUS) IMPLANT
RELOAD STAPLE 60 3.6 BLU REG (STAPLE) ×1 IMPLANT
RELOAD STAPLE 60 3.8 GOLD REG (STAPLE) IMPLANT
RELOAD STAPLE 60 4.1 GRN THCK (STAPLE) ×1 IMPLANT
RELOAD STAPLE 60 BLK VRY/THCK (STAPLE) IMPLANT
RELOAD STAPLER 60MM BLK (STAPLE) IMPLANT
RELOAD STAPLER BLUE 60MM (STAPLE) ×2 IMPLANT
RELOAD STAPLER GOLD 60MM (STAPLE) ×1 IMPLANT
RELOAD STAPLER GREEN 60MM (STAPLE) ×3 IMPLANT
SCISSORS LAP 5X45 EPIX DISP (ENDOMECHANICALS) IMPLANT
SEALANT SURGICAL APPL DUAL CAN (MISCELLANEOUS) IMPLANT
SET IRRIG TUBING LAPAROSCOPIC (IRRIGATION / IRRIGATOR) ×2 IMPLANT
SET TUBE SMOKE EVAC HIGH FLOW (TUBING) ×2 IMPLANT
SHEARS HARMONIC ACE PLUS 45CM (MISCELLANEOUS) ×2 IMPLANT
SLEEVE GASTRECTOMY 40FR VISIGI (MISCELLANEOUS) ×2 IMPLANT
SLEEVE XCEL OPT CAN 5 100 (ENDOMECHANICALS) ×6 IMPLANT
SOL ANTI FOG 6CC (MISCELLANEOUS) ×1 IMPLANT
SOLUTION ANTI FOG 6CC (MISCELLANEOUS) ×1
SPONGE GAUZE 2X2 STER 10/PKG (GAUZE/BANDAGES/DRESSINGS)
SPONGE LAP 18X18 RF (DISPOSABLE) ×2 IMPLANT
STAPLER ECHELON BIOABSB 60 FLE (MISCELLANEOUS) ×9 IMPLANT
STAPLER ECHELON LONG 60 440 (INSTRUMENTS) ×2 IMPLANT
STAPLER RELOAD 60MM BLK (STAPLE)
STAPLER RELOAD BLUE 60MM (STAPLE) ×4
STAPLER RELOAD GOLD 60MM (STAPLE) ×2
STAPLER RELOAD GREEN 60MM (STAPLE) ×6
STRIP CLOSURE SKIN 1/2X4 (GAUZE/BANDAGES/DRESSINGS) ×2 IMPLANT
SUT MNCRL AB 4-0 PS2 18 (SUTURE) ×2 IMPLANT
SUT SURGIDAC NAB ES-9 0 48 120 (SUTURE) IMPLANT
SUT VICRYL 0 TIES 12 18 (SUTURE) ×2 IMPLANT
SYR 20ML LL LF (SYRINGE) ×2 IMPLANT
TOWEL OR 17X26 10 PK STRL BLUE (TOWEL DISPOSABLE) ×2 IMPLANT
TOWEL OR NON WOVEN STRL DISP B (DISPOSABLE) ×2 IMPLANT
TROCAR BLADELESS 15MM (ENDOMECHANICALS) ×2 IMPLANT
TROCAR BLADELESS OPT 5 100 (ENDOMECHANICALS) ×2 IMPLANT
TUBING CONNECTING 10 (TUBING) ×3 IMPLANT
TUBING ENDO SMARTCAP (MISCELLANEOUS) ×2 IMPLANT

## 2019-03-03 NOTE — Progress Notes (Signed)
PHARMACY CONSULT FOR:  Risk Assessment for Post-Discharge VTE Following Bariatric Surgery  Post-Discharge VTE Risk Assessment: This patient's probability of 30-day post-discharge VTE is increased due to the factors marked:  X Male   X Age >/=60 years    BMI >/=50 kg/m2    CHF    Dyspnea at Rest    Paraplegia   X Non-gastric-band surgery    Operation Time >/=3 hr    Return to OR     Length of Stay >/= 3 d      Hx of VTE   Hypercoagulable condition   Significant venous stasis   Predicted probability of 30-day post-discharge VTE: 0.60%  Other patient-specific factors to consider:  Recommendation for Discharge: Enoxaparin 40 mg Travis [name] q12h x 2 weeks post-discharge  [name] is a 68 y.o. male who underwent Lap Gastric Sleeve resection 03/03/2019   Case start: 0954 Case end: 1113  No Known Allergies  Patient Measurements: Height: 6' 1"  (185.4 cm) Weight: (!) 325 lb 6.4 oz (147.6 kg) IBW/kg (Calculated) : 79.9 Body mass index is 42.93 kg/m.  No results for input(s): WBC, HGB, HCT, PLT, APTT, CREATININE, LABCREA, CREATININE, CREAT24HRUR, MG, PHOS, ALBUMIN, PROT, ALBUMIN, AST, ALT, ALKPHOS, BILITOT, BILIDIR, IBILI in the last 72 hours. Estimated Creatinine Clearance: 106.8 mL/min (by C-G formula based on SCr of 1.03 mg/dL).  Past Medical History:  Diagnosis Date  . ALLERGIC RHINITIS 06/24/2009  . Allergy   . Anxiety 11/12/2011   Adequate for discharge   . Arthritis    "all my joints" (09/30/2013)  . Arthritis of foot, right, degenerative 04/15/2014  . Balance disorder 03/12/2013  . Benign neoplasm of cecum   . Benign neoplasm of descending colon   . CAD (coronary artery disease) 06/24/2009   5 stents placed in 2007    . Chronic anticoagulation   . Chronic pain syndrome 10/27/2009   of ankle, shoulders, low back.  sciatica.   . Closed fracture of right foot 10/17/2014  . CORONARY ARTERY DISEASE 06/24/2009   a. s/p multiple PCIs - In 2008 he had a Taxus DES to the  mild LAD, Endeavor DES to mid LCX and distal LCX. In January 2009 he had DES to distal LCX, mid LCX and proximal LCX. In November 2009 had BMS x 2 to the mid RCA. Cath 10/2011 with patent stents, noncardiac CP. LHC 01/2013: patent stents (noncardiac CP).  . DEGENERATIVE JOINT DISEASE 06/24/2009   Qualifier: Diagnosis of  By: [name], [name]   . Depression   . Depression with anxiety    Prior suicide attempt  . [place] DISEASE, LUMBAR 04/19/2010  . ERECTILE DYSFUNCTION, ORGANIC 05/30/2010  . Essential hypertension 06/24/2009   Qualifier: Diagnosis of  By: [name], [name]   . Fibromyalgia   . Fracture dislocation of ankle joint 09/02/2015  . Gait disorder 03/12/2013  . General weakness 07/14/2014  . GERD (gastroesophageal reflux disease) 09/08/2015  . Hand joint pain 06/10/2013  . Heart murmur   . Hepatitis C    treated pt. unknown with what he was a teenager  . History of kidney stones   . Hyperlipidemia 07/15/2009   Qualifier: Diagnosis of  By: Aundra Dubin, MD, [place]    . HYPERLIPIDEMIA-MIXED 07/15/2009  . HYPERTENSION 06/24/2009  . Insomnia 10/04/2011  . Irregular heart beat   . Left hip pain 03/12/2013   Injected under ultrasound guidance on June 24, 2013   . Major depression 09/13/2015  . Myocardial infarction ([place]) 2008  .  Non-cardiac chest pain 10/2011, 01/2013  . Obesity   . Occult blood positive stool 10/17/2014  . Open ankle fracture 09/02/2015  . OSA (obstructive sleep apnea)    not using CPAP (09/30/2013)  . Pain of right thumb 04/03/2013  . Pneumonia   . PPD positive 04/08/2015  . Pre-ulcerative corn or callous 02/06/2013  . Rotator cuff tear arthropathy of both shoulders 06/10/2013   History of bilateral shoulder cuff surgery for rotator cuff tears. Reports increase in pain 09/11/2015 during physical therapy of the left shoulder.   [name] SCIATICA, LEFT 04/19/2010   Qualifier: Diagnosis of  By: [name], [name]   . Sleep apnea    wears cpap   . Spinal stenosis in cervical region 09/26/2013   . Spinal stenosis, lumbar region, with neurogenic claudication 09/26/2013  . Type II diabetes mellitus ([place]) 2012   no meds in 09/2014.   [name] Uncontrolled type 2 DM with peripheral circulatory disorder ([place]) 10/04/2013  . URETHRAL STRICTURE 06/24/2009   self catheterizes.     Medications Prior to Admission  Medication Sig Dispense Refill Last Dose  . ARIPiprazole (ABILIFY) 5 MG tablet Take 1 tablet (5 mg total) by mouth at bedtime. 90 tablet 0 03/03/2019 at 0600  . aspirin 81 MG tablet Take 81 mg by mouth at bedtime.    02/24/2019  . atorvastatin (LIPITOR) 20 MG tablet Take 1 tablet (20 mg total) by mouth daily. 90 tablet 1 03/02/2019 at Unknown time  . benztropine (COGENTIN) 0.5 MG tablet Take 1 tablet (0.5 mg total) by mouth at bedtime. 90 tablet 0 03/02/2019 at Unknown time  . celecoxib (CELEBREX) 200 MG capsule Take 1 capsule (200 mg total) by mouth 2 (two) times daily. 180 capsule 3 02/26/2019 at Unknown time  . clonazePAM (KLONOPIN) 0.5 MG tablet Take 1 tablet (0.5 mg total) by mouth at bedtime. 30 tablet 2 03/02/2019 at Unknown time  . DULoxetine (CYMBALTA) 30 MG capsule Take 1 capsule (30 mg total) by mouth 2 (two) times daily. 180 capsule 0 03/02/2019 at Unknown time  . gabapentin (NEURONTIN) 800 MG tablet Take 800 mg by mouth 3 (three) times daily.    03/03/2019 at 0600  . hydrochlorothiazide (HYDRODIURIL) 25 MG tablet Take 25 mg by mouth at bedtime.    03/01/2019  . Melatonin 5 MG TABS Take 20 mg by mouth at bedtime.    03/02/2019 at Unknown time  . methocarbamol (ROBAXIN) 500 MG tablet Take 1 tablet (500 mg total) by mouth every 8 (eight) hours as needed for muscle spasms. 90 tablet 2 03/02/2019 at Unknown time  . MYRBETRIQ 50 MG TB24 tablet Take 50 mg by mouth at bedtime.    03/02/2019 at Unknown time  . Omega-3 Fatty Acids (FISH OIL) 1000 MG CAPS Take 1,000 mg by mouth at bedtime.    02/24/2019  . oxymetazoline (AFRIN) 0.05 % nasal spray Place 1 spray into both nostrils 2 (two) times  daily as needed for congestion.   03/02/2019 at Unknown time  . Pancrelipase, Lip-Prot-Amyl, (ZENPEP) 15000-47000 units CPEP Take 40,000 Units by mouth 3 (three) times daily with meals. Take 2 capsules with meals and 1 capsule with snacks (Patient taking differently: Take 1-2 capsules by mouth See admin instructions. Take 2 capsules with meals and 1 capsule with snacks; 8 caps total daily) 120 capsule 3 03/02/2019 at Unknown time  . pantoprazole (PROTONIX) 40 MG tablet TAKE ONE TABLET BY MOUTH EVERY DAY 90 tablet 1 03/02/2019 at Unknown time  . prasugrel (  EFFIENT) 5 MG TABS tablet Take 1 tablet (5 mg total) by mouth daily. 90 tablet 2 02/24/2019  . sildenafil (VIAGRA) 100 MG tablet Take 100 mg by mouth daily.   Past Month at Unknown time  . traZODone (DESYREL) 150 MG tablet Take 1 tablet (150 mg total) by mouth at bedtime. 90 tablet 0 03/02/2019 at Unknown time  . diclofenac sodium (VOLTAREN) 1 % GEL Apply 4 g topically 4 (four) times daily. (Patient taking differently: Apply 4 g topically 3 (three) times daily as needed (pain). ) 400 g 3 More than a month at Unknown time   Minda Ditto PharmD 03/03/2019,11:52 AM

## 2019-03-03 NOTE — Transfer of Care (Signed)
Immediate Anesthesia Transfer of Care Note  Patient: Travis FRANKLIN Sr.  Procedure(s) Performed: LAPAROSCOPIC GASTRIC SLEEVE RESECTION, Upper Endo, ERAS Pathway (N/A Abdomen)  Patient Location: PACU  Anesthesia Type:General  Level of Consciousness: awake, alert  and oriented  Airway & Oxygen Therapy: Patient Spontanous Breathing and Patient connected to face mask oxygen  Post-op Assessment: Report given to RN and Post -op Vital signs reviewed and stable  Post vital signs: Reviewed and stable  Last Vitals:  Vitals Value Taken Time  BP    Temp    Pulse    Resp    SpO2      Last Pain:  Vitals:   03/03/19 0812  TempSrc:   PainSc: 0-No pain         Complications: No apparent anesthesia complications

## 2019-03-03 NOTE — Op Note (Signed)
Preoperative diagnosis: laparoscopic sleeve gastrectomy  Postoperative diagnosis: Same   Procedure: Upper endoscopy   Surgeon: Gurney Maxin, M.D.  Anesthesia: Gen.   Indications for procedure: This patient was undergoing a laparoscopic sleeve gastrectomy.   Description of procedure: The endoscopy was placed in the mouth and into the oropharynx and under endoscopic vision it was advanced to the esophagogastric junction. The pouch was insufflated and no bleeding or bubbles were seen. The GEJ was identified at 42cm from the teeth. No bleeding or leaks were detected. The scope was withdrawn without difficulty.   Gurney Maxin, M.D. General, Bariatric, & Minimally Invasive Surgery [facility]

## 2019-03-03 NOTE — Anesthesia Postprocedure Evaluation (Signed)
Anesthesia Post Note  Patient: Travis DELAUGHTER Sr.  Procedure(s) Performed: LAPAROSCOPIC GASTRIC SLEEVE RESECTION, Upper Endo, ERAS Pathway (N/A Abdomen)     Patient location during evaluation: PACU Anesthesia Type: General Level of consciousness: awake Pain management: pain level controlled Vital Signs Assessment: post-procedure vital signs reviewed and stable Respiratory status: spontaneous breathing Cardiovascular status: stable Postop Assessment: no apparent nausea or vomiting Anesthetic complications: no    Last Vitals:  Vitals:   03/03/19 1215 03/03/19 1242  BP: (!) 192/85 (!) 176/76  Pulse: 62 (!) 59  Resp: 15 16  Temp:  37.1 C  SpO2: 99% 96%    Last Pain:  Vitals:   03/03/19 1242  TempSrc: Oral  PainSc:    Pain Goal:                   Huston Foley

## 2019-03-03 NOTE — Op Note (Signed)
03/03/2019 [name] [birth year] [id number]   PRE-OPERATIVE DIAGNOSIS:     Morbid obesity with BMI of 43   Chronic pain syndrome   Essential hypertension   Coronary artery disease involving native coronary artery of native heart without angina pectoris   DISC DISEASE, LUMBAR   Diabetes mellitus type 2 in obese (HCC)   OSA on CPAP  POST-OPERATIVE DIAGNOSIS:  same  PROCEDURE:  Procedure(s): LAPAROSCOPIC SLEEVE GASTRECTOMY  UPPER GI ENDOSCOPY  SURGEON:  Surgeon(s): Gayland Curry, MD FACS FASMBS  ASSISTANTS: Gurney Maxin MD FACS  ANESTHESIA:   general  DRAINS: none   BOUGIE: 40 fr ViSiGi  LOCAL MEDICATIONS USED:   Exparel  EBL: 25 cc  SPECIMEN:  Source of Specimen:  Greater curvature of stomach  FINDINGS: folded mesh at umbilicus; indirect left inguinal hernia.   DISPOSITION OF SPECIMEN:  PATHOLOGY  COUNTS:  YES  INDICATION FOR PROCEDURE: This is a very pleasant 68 y.o.-year-old morbidly obese male who has had unsuccessful attempts for sustained weight loss. The patient presents today for a planned laparoscopic sleeve gastrectomy with upper endoscopy. We have discussed the risk and benefits of the procedure extensively preoperatively. Please see my separate notes.  PROCEDURE: After obtaining informed consent and receiving 5000 units of subcutaneous heparin, the patient was brought to the operating room at [facility] and placed supine on the operating room table. General endotracheal anesthesia was established. Sequential compression devices were placed. A orogastric tube was placed. The patient's abdomen was prepped and draped in the usual standard surgical fashion. The patient received preoperative IV antibiotic. A surgical timeout was performed. ERAS protocol used.   Access to the abdomen was achieved using a 5 mm 0 laparoscope thru a 5 mm trocar In the left upper Quadrant 2 fingerbreadths below the left subcostal margin using the Optiview  technique. Pneumoperitoneum was smoothly established up to 15 mm of mercury. The laparoscope was advanced and the abdominal cavity was surveilled. The patient was then placed in reverse Trendelenburg.   A 5 mm trocar was placed slightly above and to the left of the umbilicus under direct visualization.  The [facility] liver retractor was placed under the left lobe of the liver through a 5 mm trocar incision site in the subxiphoid position. A 5 mm trocar was placed in the lateral right upper quadrant along with a 15 mm trocar in the mid right abdomen. A final 5 mm trocar was placed in the lateral LUQ.  All under direct visualization after exparel had been infiltrated in bilateral lateral upper abdominal walls as a TAP block.  The stomach was inspected. It was completely decompressed and the orogastric tube was removed.  There was no small anterior dimple that was obviously visible. His preop ugi showed no hiatal hernia.   We identified the pylorus and measured 6 cm proximal to the pylorus and identified an area of where we would start taking down the short gastric vessels. Harmonic scalpel was used to take down the short gastric vessels along the greater curvature of the stomach. We were able to enter the lesser sac. We continued to march along the greater curvature of the stomach taking down the short gastrics. As we approached the gastrosplenic ligament we took care in this area not to injure the spleen. We were able to take down the entire gastrosplenic ligament. We then mobilized the fundus away from the left crus of diaphragm. There were a few posterior gastric avascular attachments which were taken down. This  left the stomach completely mobilized. No vessels had been taken down along the lesser curvature of the stomach.  We then reidentified the pylorus. A 40Fr ViSiGi was then placed in the oropharynx and advanced down into the stomach and placed in the distal antrum and positioned along the lesser  curvature. It was placed under suction which secured the 40Fr ViSiGi in place along the lesser curve. Then using the [employer] echelon 60 mm stapler with a green load with Seamguard, I placed a stapler along the antrum approximately 5 cm from the pylorus. The stapler was angled so that there is ample room at the angularis incisura. I then fired the first staple load after inspecting it posteriorly to ensure adequate space both anteriorly and posteriorly. At this point I still was not completely past the angularis so with another green load with Seamguard, I placed the stapler in position just inside the prior stapleline. We then rotated the stomach to insure that there was adequate anteriorly as well as posteriorly. The stapler was then fired.  At this point I started using 60 mm gold load staple cartridge x1 with Seamguard. The echelon stapler was then repositioned with a 60 mm blue load with Seamguard and we continued to march up along the [place]. My assistant was holding traction along the greater curvature stomach along the cauterized short gastric vessels ensuring that the stomach was symmetrically retracted. Prior to each firing of the staple, we rotated the stomach to ensure that there is adequate stomach left.  As we approached the fundus, I used 60 mm blue cartridge with Seamguard aiming  lateral to the GE junction after mobilizing some of the esophageal fat pad.  The sleeve was inspected. There is no evidence of cork screw. The staple line appeared hemostatic. The CRNA inflated the ViSiGi to the green zone and the upper abdomen was flooded with saline. There were no bubbles. The sleeve was decompressed and the ViSiGi removed. My assistant scrubbed out and performed an upper endoscopy. The sleeve easily distended with air and the scope was easily advanced to the pylorus. There is no evidence of internal bleeding or cork screwing. There was no narrowing at the angularis. There is no evidence of bubbles. Please  see his operative note for further details. The gastric sleeve was decompressed and the endoscope was removed.  There was a little bit of oozing in the sleeve in the antrum and incisura - I ended up placing 9 clips on the reinforced staple line. The staple line was hemostatic at this point. The greater curvature the stomach was grasped with a laparoscopic grasper and removed from the 15 mm trocar site.  The liver retractor was removed. I then closed the 15 mm trocar site with 2 interrupted 0 Vicryl sutures through the fascia using the endoclose. The closure was viewed laparoscopically and it was airtight. Remaining Exparel was then infiltrated in the preperitoneal spaces around the trocar sites. Pneumoperitoneum was released. All trocar sites were closed with a 4-0 Monocryl in a subcuticular fashion followed by the application of benzoin, steri-strips, and bandaids. The patient was extubated and taken to the recovery room in stable condition. All needle, instrument, and sponge counts were correct x2. There are no immediate complications  (2) 60 mm green with Seamguard (1) 60 mm gold with seamguard (2) 60 mm blue with  seamguard  PLAN OF CARE: Admit to inpatient   PATIENT DISPOSITION:  PACU - hemodynamically stable.   Delay start of Pharmacological VTE agent (>24hrs)  due to surgical blood loss or risk of bleeding:  no  Leighton Ruff. Redmond Pulling, MD, FACS FASMBS General, Bariatric, & Minimally Invasive Surgery [facility], [place]

## 2019-03-03 NOTE — Progress Notes (Signed)
Pt has declined the use of CPAP QHS.  RT to monitor and assess as needed.

## 2019-03-03 NOTE — H&P (Signed)
Travis BOLLIER Sr. is an 68 y.o. male.   Chief Complaint: here for surgery HPI: 68yo male presents for lap sleeve gastrectomy. Denies medical changes since seen last in clinic.   The patient is a 68 year old male who presents for a bariatric surgery evaluation.I initially discussed weight loss surgery with him 1 year ago. He comes in for long-term follow-up to discuss weight loss surgery. We are planning sleeve gastrectomy due to his age and medical comorbidities. Since I initially met him he underwent a sleep study and was found to have obstructive sleep apnea and has been started on CPAP. He has had follow-up with the neurologist for his CPAP. He likes it and feels more energy during the daytime. He also had lumbar surgery in February 2020. He was readmitted about 1 month afterwards with mild sepsis. CT PET scan was negative for PE. He was found to have a urinary tract infection. He saw cardiology in April 2020 and they had no additional recommendations for impending bariatric surgery. He had a colonoscopy at the end of June with Dr. Havery Moros and was found to have several polyps most of which were tubular adenomas and was put on a 3 year recall list. He had an upper GI which was unremarkable last fall. Recent blood work has been unremarkable except for mild anemia with a hemoglobin of 11.7 and a hematocrit of 36. Last lipid panel in January of this year was unremarkable except for a low HDL level of 39. Last hemoglobin A1c was normal.  He denies any chest pain, chest tightness, angina, paroxysmal nocturnal dyspnea, TIAs or amaurosis fugax.  August 2019 He is referred by Dr Jenny Reichmann for evaluation of weight loss surgery. his orthopedic surgeon is Dr Louanne Skye. his cardiologist is Dr End. He completed our seminar in person. His psychiatrist is Dr. Adele Schilder. He is interested in the sleeve gastrectomy. He is interested in improving his overall health. He wants to see can get improvement in  his chronic pain. He is on chronic narcotics managed by his orthopedic doctor and his PCP. Despite numerous attempts were sustained weight loss he has been unsuccessful. He has tried Weight Watchers, Orvan Seen, Rickard Patience, keto diet on many occasions all without any long-term success.  His comorbidities include hypertension, coronary artery disease status post multiple stents, chronic anticoagulation, chronic pain syndrome, diabetes mellitus type 2, obstructive sleep apnea  He denies any chest pain or chest pressure. He does state that he gets short of breath at rest as well as with walking. He uses of prosthetic. He endorses paroxysmal nocturnal dyspnea. He denies any personal or family history of blood clots. He does have some problems with edema in his left lower leg. He has had multiple cardiac stents and is on an antiplatelet agent. He denies any TIAs or angina or amaurosis fugax. He sleeps on 3 pillows for comfort. He reports a history of obstructive sleep apnea but hasn't used his machine in 10 years. He does have diabetes mellitus and ended up having a right BKA in January 2018 due to Charcot infection.  He denies any heartburn or reflux even though he takes Protonix daily. He states that he just takes it for prevention. He does not have any heartburn or indigestion if he doesn't take it. He has a daily bowel movement. I repaired a inguinal hernia on him laparoscopically a few years ago. He denies any abdominal pain. He denies any melena or hematochezia. He denies any dysuria.  He has chronic  pain in his leg, neck, shoulder, and back. He also has pain in his hips. He states that he needs both hip and back surgery but needs to lose weight first. His diabetes has been under well control. He has bipolar type I. He denies any tobacco or alcohol or drugs.  He had a normal comprehensive metabolic panel in January. His lipid panel that time was normal except for an HDL level of  38.  Past Medical History:  Diagnosis Date  . ALLERGIC RHINITIS 06/24/2009  . Allergy   . Anxiety 11/12/2011   Adequate for discharge   . Arthritis    "all my joints" (09/30/2013)  . Arthritis of foot, right, degenerative 04/15/2014  . Balance disorder 03/12/2013  . Benign neoplasm of cecum   . Benign neoplasm of descending colon   . CAD (coronary artery disease) 06/24/2009   5 stents placed in 2007    . Chronic anticoagulation   . Chronic pain syndrome 10/27/2009   of ankle, shoulders, low back.  sciatica.   . Closed fracture of right foot 10/17/2014  . CORONARY ARTERY DISEASE 06/24/2009   a. s/p multiple PCIs - In 2008 he had a Taxus DES to the mild LAD, Endeavor DES to mid LCX and distal LCX. In January 2009 he had DES to distal LCX, mid LCX and proximal LCX. In November 2009 had BMS x 2 to the mid RCA. Cath 10/2011 with patent stents, noncardiac CP. LHC 01/2013: patent stents (noncardiac CP).  . DEGENERATIVE JOINT DISEASE 06/24/2009   Qualifier: Diagnosis of  By: [name], [name]   . Depression   . Depression with anxiety    Prior suicide attempt  . Badger DISEASE, LUMBAR 04/19/2010  . ERECTILE DYSFUNCTION, ORGANIC 05/30/2010  . Essential hypertension 06/24/2009   Qualifier: Diagnosis of  By: [name], [name]   . Fibromyalgia   . Fracture dislocation of ankle joint 09/02/2015  . Gait disorder 03/12/2013  . General weakness 07/14/2014  . GERD (gastroesophageal reflux disease) 09/08/2015  . Hand joint pain 06/10/2013  . Heart murmur   . Hepatitis C    treated pt. unknown with what he was a teenager  . History of kidney stones   . Hyperlipidemia 07/15/2009   Qualifier: Diagnosis of  By: Aundra Dubin, MD, [place]    . HYPERLIPIDEMIA-MIXED 07/15/2009  . HYPERTENSION 06/24/2009  . Insomnia 10/04/2011  . Irregular heart beat   . Left hip pain 03/12/2013   Injected under ultrasound guidance on June 24, 2013   . Major depression 09/13/2015  . Myocardial infarction ([place]) 2008  . Non-cardiac chest pain  10/2011, 01/2013  . Obesity   . Occult blood positive stool 10/17/2014  . Open ankle fracture 09/02/2015  . OSA (obstructive sleep apnea)    not using CPAP (09/30/2013)  . Pain of right thumb 04/03/2013  . Pneumonia   . PPD positive 04/08/2015  . Pre-ulcerative corn or callous 02/06/2013  . Rotator cuff tear arthropathy of both shoulders 06/10/2013   History of bilateral shoulder cuff surgery for rotator cuff tears. Reports increase in pain 09/11/2015 during physical therapy of the left shoulder.   [name] SCIATICA, LEFT 04/19/2010   Qualifier: Diagnosis of  By: [name], [name]   . Sleep apnea    wears cpap   . Spinal stenosis in cervical region 09/26/2013  . Spinal stenosis, lumbar region, with neurogenic claudication 09/26/2013  . Type II diabetes mellitus ([place]) 2012   no meds in 09/2014.   [name]  Uncontrolled type 2 DM with peripheral circulatory disorder ([place]) 10/04/2013  . URETHRAL STRICTURE 06/24/2009   self catheterizes.     Past Surgical History:  Procedure Laterality Date  . AMPUTATION Right 06/14/2016   Procedure: AMPUTATION BELOW KNEE;  Surgeon: Newt Minion, MD;  Location: [place];  Service: Orthopedics;  Laterality: Right;  . ANKLE FUSION Right 04/15/2014   Procedure: Right Subtalar, Talonavicular Fusion;  Surgeon: Newt Minion, MD;  Location: [place];  Service: Orthopedics;  Laterality: Right;  . ANKLE FUSION Right 04/18/2016   Procedure: Right Ankle Tibiocalcaneal Fusion;  Surgeon: Newt Minion, MD;  Location: [place];  Service: Orthopedics;  Laterality: Right;  . ANTERIOR CERVICAL DECOMP/DISCECTOMY FUSION N/A 09/26/2013   Procedure: ANTERIOR CERVICAL DISCECTOMY FUSION C3-4, plate and screw fixation, allograft bone graft;  Surgeon: Jessy Oto, MD;  Location: [place];  Service: Orthopedics;  Laterality: N/A;  . BACK SURGERY    . BELOW KNEE LEG AMPUTATION Right 06/14/2016   right ankle and foot  . CARDIAC CATHETERIZATION  X 1  . CARPAL TUNNEL RELEASE Bilateral   . COLONOSCOPY    . COLONOSCOPY  N/A 10/22/2014   Procedure: COLONOSCOPY;  Surgeon: Lafayette Dragon, MD;  Location: [facility] ENDOSCOPY;  Service: Endoscopy;  Laterality: N/A;  . CORONARY ANGIOPLASTY WITH STENT PLACEMENT     "I have 9 stents"  . ESOPHAGOGASTRODUODENOSCOPY N/A 10/19/2014   Procedure: ESOPHAGOGASTRODUODENOSCOPY (EGD);  Surgeon: Jerene Bears, MD;  Location: [facility] ENDOSCOPY;  Service: Endoscopy;  Laterality: N/A;  . FRACTURE SURGERY    . FUSION OF TALONAVICULAR JOINT Right 04/15/2014   dr duda  . HERNIA REPAIR     umbilical  . INGUINAL HERNIA REPAIR Right 05/11/2015   Procedure: LAPAROSCOPIC REPAIR RIGHT  INGUINAL HERNIA;  Surgeon: Greer Pickerel, MD;  Location: [place];  Service: General;  Laterality: Right;  . INSERTION OF MESH Right 05/11/2015   Procedure: INSERTION OF MESH;  Surgeon: Greer Pickerel, MD;  Location: [place];  Service: General;  Laterality: Right;  . JOINT REPLACEMENT    . KNEE CARTILAGE SURGERY Right X 12   "~ 1/2 open; ~ 1/2 scopes"  . KNEE CARTILAGE SURGERY Left X 3   "3 scopes"  . LEFT HEART CATHETERIZATION WITH CORONARY ANGIOGRAM N/A 02/10/2013   Procedure: LEFT HEART CATHETERIZATION WITH CORONARY ANGIOGRAM;  Surgeon: Burnell Blanks, MD;  Location: [facility] CATH LAB;  Service: Cardiovascular;  Laterality: N/A;  . LUMBAR LAMINECTOMY N/A 07/16/2018   Procedure: LEFT L4-5 REDO PARTIAL LUMBAR HEMILAMINECTOMY WITH FORAMINOTOMY LEFT L4;  Surgeon: Jessy Oto, MD;  Location: [place];  Service: Orthopedics;  Laterality: N/A;  . LUMBAR LAMINECTOMY/DECOMPRESSION MICRODISCECTOMY N/A 01/27/2014   Procedure: CENTRAL LUMBAR LAMINECTOMY L4-5 AND L3-4;  Surgeon: Jessy Oto, MD;  Location: [place];  Service: Orthopedics;  Laterality: N/A;  . ORIF ANKLE FRACTURE Right 09/02/2015   Procedure: OPEN REDUCTION INTERNAL FIXATION (ORIF) ANKLE FRACTURE;  Surgeon: Newt Minion, MD;  Location: [place];  Service: Orthopedics;  Laterality: Right;  . PERIPHERALLY INSERTED CENTRAL CATHETER INSERTION  09/02/2015  . POLYPECTOMY    . SHOULDER  ARTHROSCOPY W/ ROTATOR CUFF REPAIR Bilateral    "3 on the right; 1 on the left"  . SKIN SPLIT GRAFT Right 10/01/2015   Procedure: RIGHT ANKLE APPLY SKIN GRAFT SPLIT THICKNESS;  Surgeon: Newt Minion, MD;  Location: [place];  Service: Orthopedics;  Laterality: Right;  . TONSILLECTOMY    . TOOTH EXTRACTION    . TOTAL HIP ARTHROPLASTY  Right 04/17/2018  . TOTAL HIP ARTHROPLASTY Right 04/17/2018   Procedure: RIGHT TOTAL HIP ARTHROPLASTY;  Surgeon: Newt Minion, MD;  Location: [place];  Service: Orthopedics;  Laterality: Right;  . TOTAL KNEE ARTHROPLASTY Bilateral 2006/07/03  . UMBILICAL HERNIA REPAIR     UHR  . UPPER GASTROINTESTINAL ENDOSCOPY    . URETHRAL DILATION  X 4  . VASECTOMY    . WISDOM TOOTH EXTRACTION      Family History  Problem Relation Age of Onset  . Depression Mother   . Heart disease Mother   . Hypertension Mother   . Breast cancer Mother   . Cancer Mother   . Diabetes Father   . Heart disease Father        CABG  . Hypertension Father   . Hyperlipidemia Father   . Prostate cancer Father   . Skin cancer Father   . Depression Brother        x 2  . Hypertension Brother        x2  . Healthy Son   . Heart disease Maternal Grandfather   . Early death Maternal Grandfather   . Heart attack Maternal Grandfather 07-04-2063  . Early death Paternal Grandfather   . Coronary artery disease Other   . Hypertension Other   . Depression Other   . Healthy Son   . Colon cancer Neg Hx   . Colon polyps Neg Hx   . Esophageal cancer Neg Hx   . Rectal cancer Neg Hx   . Stomach cancer Neg Hx    Social History:  reports that he quit smoking about 8 years ago. His smoking use included cigars. He has never used smokeless tobacco. He reports previous alcohol use. He reports that he does not use drugs.  Allergies: No Known Allergies  Medications Prior to Admission  Medication Sig Dispense Refill  . ARIPiprazole (ABILIFY) 5 MG tablet Take 1 tablet (5 mg total) by mouth at bedtime. 90 tablet 0   . aspirin 81 MG tablet Take 81 mg by mouth at bedtime.     [name] atorvastatin (LIPITOR) 20 MG tablet Take 1 tablet (20 mg total) by mouth daily. 90 tablet 1  . benztropine (COGENTIN) 0.5 MG tablet Take 1 tablet (0.5 mg total) by mouth at bedtime. 90 tablet 0  . celecoxib (CELEBREX) 200 MG capsule Take 1 capsule (200 mg total) by mouth 2 (two) times daily. 180 capsule 3  . clonazePAM (KLONOPIN) 0.5 MG tablet Take 1 tablet (0.5 mg total) by mouth at bedtime. 30 tablet 2  . DULoxetine (CYMBALTA) 30 MG capsule Take 1 capsule (30 mg total) by mouth 2 (two) times daily. 180 capsule 0  . gabapentin (NEURONTIN) 800 MG tablet Take 800 mg by mouth 3 (three) times daily.     . hydrochlorothiazide (HYDRODIURIL) 25 MG tablet Take 25 mg by mouth at bedtime.     . Melatonin 5 MG TABS Take 20 mg by mouth at bedtime.     . methocarbamol (ROBAXIN) 500 MG tablet Take 1 tablet (500 mg total) by mouth every 8 (eight) hours as needed for muscle spasms. 90 tablet 2  . MYRBETRIQ 50 MG TB24 tablet Take 50 mg by mouth at bedtime.     . Omega-3 Fatty Acids (FISH OIL) 1000 MG CAPS Take 1,000 mg by mouth at bedtime.     [name] oxymetazoline (AFRIN) 0.05 % nasal spray Place 1 spray into both nostrils 2 (two) times daily as needed for congestion.    [name]  Pancrelipase, Lip-Prot-Amyl, (ZENPEP) 15000-47000 units CPEP Take 40,000 Units by mouth 3 (three) times daily with meals. Take 2 capsules with meals and 1 capsule with snacks (Patient taking differently: Take 1-2 capsules by mouth See admin instructions. Take 2 capsules with meals and 1 capsule with snacks; 8 caps total daily) 120 capsule 3  . pantoprazole (PROTONIX) 40 MG tablet TAKE ONE TABLET BY MOUTH EVERY DAY 90 tablet 1  . prasugrel (EFFIENT) 5 MG TABS tablet Take 1 tablet (5 mg total) by mouth daily. 90 tablet 2  . sildenafil (VIAGRA) 100 MG tablet Take 100 mg by mouth daily.    . traZODone (DESYREL) 150 MG tablet Take 1 tablet (150 mg total) by mouth at bedtime. 90 tablet 0  .  diclofenac sodium (VOLTAREN) 1 % GEL Apply 4 g topically 4 (four) times daily. (Patient taking differently: Apply 4 g topically 3 (three) times daily as needed (pain). ) 400 g 3    Results for orders placed or performed during the hospital encounter of 03/03/19 (from the past 48 hour(s))  Glucose, capillary     Status: Abnormal   Collection Time: 03/03/19  7:39 AM  Result Value Ref Range   Glucose-Capillary 133 (H) 70 - 99 mg/dL   Comment 1 Notify RN    Comment 2 Document in Chart    No results found.  Review of Systems  All other systems reviewed and are negative.   Blood pressure (!) 148/90, pulse 66, temperature 98.7 F (37.1 C), temperature source Oral, resp. rate 16, height _0  (1.854 m), weight (!) 147.6 kg, SpO2 95 %. Physical Exam  Vitals reviewed. Constitutional: He is oriented to person, place, and time. He appears well-developed and well-nourished. No distress.  HENT:  Head: Normocephalic and atraumatic.  Right Ear: External ear normal.  Left Ear: External ear normal.  Eyes: Conjunctivae are normal. No scleral icterus.  Neck: Normal range of motion. Neck supple. No tracheal deviation present. No thyromegaly present.  Cardiovascular: Normal rate and normal heart sounds.  Respiratory: Effort normal and breath sounds normal. No stridor. No respiratory distress. He has no wheezes.  GI: Soft. He exhibits no distension. There is no abdominal tenderness. There is no rebound.  Musculoskeletal:        General: No tenderness or edema.     Comments: Right bka  Lymphadenopathy:    He has no cervical adenopathy.  Neurological: He is alert and oriented to person, place, and time. He exhibits normal muscle tone.  Skin: Skin is warm and dry. No rash noted. He is not diaphoretic. No erythema. No pallor.  Psychiatric: He has a normal mood and affect. His behavior is normal. Judgment and thought content normal.     Assessment/Plan Severe obesity hypertension,  coronary artery  disease status post multiple stents on chronic anticoagulation,  chronic pain syndrome,  diabetes mellitus type 2,  obstructive sleep apnea  Stopped oral blood thinner 1 week ago To or for lap sleeve gastrectomy All questions asked/answered Greer Pickerel, MD 03/03/2019, 9:00 AM

## 2019-03-03 NOTE — Progress Notes (Signed)
Pt started drinking first 2oz cup of water at 1600.

## 2019-03-03 NOTE — Anesthesia Procedure Notes (Signed)
Procedure Name: Intubation Date/Time: 03/03/2019 9:29 AM Performed by: Maxwell Caul, CRNA Pre-anesthesia Checklist: Patient identified, Emergency Drugs available, Suction available and Patient being monitored Patient Re-evaluated:Patient Re-evaluated prior to induction Oxygen Delivery Method: Circle system utilized Preoxygenation: Pre-oxygenation with 100% oxygen Induction Type: IV induction Laryngoscope Size: Mac and 4 Grade View: Grade I Tube type: Oral Tube size: 7.5 mm Number of attempts: 1 Airway Equipment and Method: Stylet Placement Confirmation: ETT inserted through vocal cords under direct vision,  positive ETCO2 and breath sounds checked- equal and bilateral Secured at: 21 cm Tube secured with: Tape Dental Injury: Teeth and Oropharynx as per pre-operative assessment

## 2019-03-03 NOTE — Progress Notes (Signed)
Discussed post op day goals with patient including ambulation, IS, diet progression, pain, and nausea control.  BSTOP education provided including BSTOP information guide, "Guide for Pain Management after your Bariatric Procedure".  Questions answered. 

## 2019-03-04 ENCOUNTER — Encounter (HOSPITAL_COMMUNITY): Payer: Self-pay | Admitting: General Surgery

## 2019-03-04 LAB — COMPREHENSIVE METABOLIC PANEL
ALT: 19 U/L (ref 0–44)
AST: 28 U/L (ref 15–41)
Albumin: 3.8 g/dL (ref 3.5–5.0)
Alkaline Phosphatase: 58 U/L (ref 38–126)
Anion gap: 8 (ref 5–15)
BUN: 18 mg/dL (ref 8–23)
CO2: 26 mmol/L (ref 22–32)
Calcium: 8.8 mg/dL — ABNORMAL LOW (ref 8.9–10.3)
Chloride: 102 mmol/L (ref 98–111)
Creatinine, Ser: 0.96 mg/dL (ref 0.61–1.24)
GFR calc Af Amer: >60 mL/min (ref >60)
GFR calc non Af Amer: >60 mL/min (ref >60)
Glucose, Bld: 115 mg/dL — ABNORMAL HIGH (ref 70–99)
Potassium: 4.4 mmol/L (ref 3.5–5.1)
Sodium: 136 mmol/L (ref 135–145)
Total Bilirubin: 0.9 mg/dL (ref 0.3–1.2)
Total Protein: 6.6 g/dL (ref 6.5–8.1)

## 2019-03-04 LAB — CBC WITH DIFFERENTIAL/PLATELET
Abs Immature Granulocytes: 0.03 10*3/uL (ref 0.00–0.07)
Basophils Absolute: 0 10*3/uL (ref 0.0–0.1)
Basophils Relative: 0 %
Eosinophils Absolute: 0 10*3/uL (ref 0.0–0.5)
Eosinophils Relative: 0 %
HCT: 38.2 % — ABNORMAL LOW (ref 39.0–52.0)
Hemoglobin: 11.9 g/dL — ABNORMAL LOW (ref 13.0–17.0)
Immature Granulocytes: 0 %
Lymphocytes Relative: 10 %
Lymphs Abs: 0.9 10*3/uL (ref 0.7–4.0)
MCH: 27.7 pg (ref 26.0–34.0)
MCHC: 31.2 g/dL (ref 30.0–36.0)
MCV: 88.8 fL (ref 80.0–100.0)
Monocytes Absolute: 0.8 10*3/uL (ref 0.1–1.0)
Monocytes Relative: 10 %
Neutro Abs: 6.9 10*3/uL (ref 1.7–7.7)
Neutrophils Relative %: 80 %
Platelets: 242 10*3/uL (ref 150–400)
RBC: 4.3 MIL/uL (ref 4.22–5.81)
RDW: 14.5 % (ref 11.5–15.5)
WBC: 8.7 10*3/uL (ref 4.0–10.5)
nRBC: 0 % (ref 0.0–0.2)

## 2019-03-04 LAB — CBC
HCT: 35.3 % — ABNORMAL LOW (ref 39.0–52.0)
Hemoglobin: 12 g/dL — ABNORMAL LOW (ref 13.0–17.0)
MCH: 28.4 pg (ref 26.0–34.0)
MCHC: 34 g/dL (ref 30.0–36.0)
MCV: 83.6 fL (ref 80.0–100.0)
Platelets: 229 10*3/uL (ref 150–400)
RBC: 4.22 MIL/uL (ref 4.22–5.81)
RDW: 14.6 % (ref 11.5–15.5)
WBC: 7.8 10*3/uL (ref 4.0–10.5)
nRBC: 0 % (ref 0.0–0.2)

## 2019-03-04 LAB — GLUCOSE, CAPILLARY
Glucose-Capillary: 107 mg/dL — ABNORMAL HIGH (ref 70–99)
Glucose-Capillary: 110 mg/dL — ABNORMAL HIGH (ref 70–99)
Glucose-Capillary: 112 mg/dL — ABNORMAL HIGH (ref 70–99)
Glucose-Capillary: 117 mg/dL — ABNORMAL HIGH (ref 70–99)
Glucose-Capillary: 127 mg/dL — ABNORMAL HIGH (ref 70–99)
Glucose-Capillary: 127 mg/dL — ABNORMAL HIGH (ref 70–99)
Glucose-Capillary: 99 mg/dL (ref 70–99)

## 2019-03-04 LAB — SURGICAL PATHOLOGY

## 2019-03-04 MED ORDER — ENOXAPARIN (LOVENOX) PATIENT EDUCATION KIT
PACK | Freq: Once | Status: AC
  Administered 2019-03-04: 16:00:00
  Filled 2019-03-04: qty 1

## 2019-03-04 NOTE — Progress Notes (Signed)
1 Day Post-Op   Subjective/Chief Complaint: States he is having some discomfort in his lower abdomen.  But no nausea or vomiting.  He states pain is otherwise okay.   Objective: Vital signs in last 24 hours: Temp:  [98 F (36.7 C)-99.5 F (37.5 C)] 99.5 F (37.5 C) (10/13 1344) Pulse Rate:  [40-61] 40 (10/13 1344) Resp:  [16-18] 16 (10/13 1344) BP: (153-197)/(55-104) 197/67 (10/13 1344) SpO2:  [88 %-97 %] 95 % (10/13 1344) Last BM Date: 03/03/19  Intake/Output from previous day: 10/12 0701 - 10/13 0700 In: 3842.7 [P.O.:480; I.V.:3262.7; IV Piggyback:100] Out: 575 [Urine:550; Blood:25] Intake/Output this shift: Total I/O In: 1306.3 [P.O.:420; I.V.:886.3] Out: 600 [Urine:600]  Alert, no apparent distress, Clear to auscultation anteriorly Regular rhythm Obese, soft, appropriate mild tenderness, dressings okay Right BKA  Lab Results:  Recent Labs    03/04/19 0420 03/04/19 1148  WBC 8.7 7.8  HGB 11.9* 12.0*  HCT 38.2* 35.3*  PLT 242 229   BMET Recent Labs    03/04/19 0420  NA 136  K 4.4  CL 102  CO2 26  GLUCOSE 115*  BUN 18  CREATININE 0.96  CALCIUM 8.8*   PT/INR No results for input(s): LABPROT, INR in the last 72 hours. ABG No results for input(s): PHART, HCO3 in the last 72 hours.  Invalid input(s): PCO2, PO2  Studies/Results: No results found.  Anti-infectives: Anti-infectives (From admission, onward)   Start     Dose/Rate Route Frequency Ordered Stop   03/03/19 0745  cefoTEtan (CEFOTAN) 2 g in sodium chloride 0.9 % 100 mL IVPB     2 g 200 mL/hr over 30 Minutes Intravenous On call to O.R. 03/03/19 0735 03/03/19 0950      Assessment/Plan: s/p Procedure(s): LAPAROSCOPIC GASTRIC SLEEVE RESECTION, Upper Endo, ERAS Pathway (N/A)  Doing well.  No fever or tachycardia.  No hypotension.  No clinical signs of leak.  Explained that the discomfort he is having is expected.  Given the fact that he is an amputee I think he needs another day for  evaluation with working with physical therapy and is not stable on his feet for mobility.  We will continue chemical DVT prophylaxis He is felt to be at high risk for postoperative DVT/PE risk so he will go home on Lovenox twice daily for 2 weeks before resuming his oral antiplatelet agent as part of his cardiac profile risk   LOS: 1 day    Greer Pickerel 03/04/2019

## 2019-03-04 NOTE — Evaluation (Signed)
Physical Therapy Evaluation Patient Details Name: Travis [name]. MRN: [id number] DOB: 1950/09/12 Today's Date: 03/04/2019   History of Present Illness  Pt is a 68 year old male s/p lap sleeve gastrectomy with PMHx significant for but not limited to HTN, CAD, R BKA, back surgery, bipolar disorder, diabetes  Clinical Impression  Patient is s/p above surgery resulting in functional limitations due to the deficits listed below (see PT Problem List).  Patient will benefit from skilled PT to increase their independence and safety with mobility to allow discharge to the venue listed below.  Pt able to don prosthesis and assisted with ambulating in hallway.  Pt more unsteady with distance so had pt sit in recliner for safety.  Pt has RW at home.  Pt to d/c home however does not appear safe to d/c home today.  RN assisted with pushing recliner and aware.    Follow Up Recommendations Home health PT    Equipment Recommendations  None recommended by PT    Recommendations for Other Services       Precautions / Restrictions Precautions Precautions: Fall Precaution Comments: R BKA has prosthesis      Mobility  Bed Mobility Overal bed mobility: Modified Independent                Transfers Overall transfer level: Needs assistance Equipment used: Straight cane Transfers: Sit to/from Stand Sit to Stand: Min guard;Min assist         General transfer comment: slight assist to steady upon rise  Ambulation/Gait Ambulation/Gait assistance: Min assist;+2 safety/equipment Gait Distance (Feet): 300 Feet Assistive device: IV Pole;Straight cane Gait Pattern/deviations: Step-through pattern;Decreased stride length;Decreased weight shift to left     General Gait Details: pt heavily leans to left side and uses SPC on left, cues for reducing speed and controlling ambulation; pt with decreased control and foot clearance of prosthesis with fatigue so requested pt sit down in recliner  (which was following) for safety  Stairs            Wheelchair Mobility    Modified Rankin (Stroke Patients Only)       Balance                                             Pertinent Vitals/Pain Pain Assessment: 0-10 Pain Score: 5  Pain Location: surgical site Pain Descriptors / Indicators: Discomfort;Sore Pain Intervention(s): Repositioned;Monitored during session    Home Living Family/patient expects to be discharged to:: Private residence Living Arrangements: Alone   Type of Home: Mobile home Home Access: Stairs to enter Entrance Stairs-Rails: Right;Left;Can reach both Entrance Stairs-Number of Steps: 6 Home Layout: One level Home Equipment: Adaptive equipment;Walker - 2 wheels;Toilet riser;Cane - single point      Prior Function Level of Independence: Independent with assistive device(s)               Hand Dominance        Extremity/Trunk Assessment        Lower Extremity Assessment Lower Extremity Assessment: RLE deficits/detail;Generalized weakness RLE Deficits / Details: R BKA       Communication   Communication: No difficulties  Cognition Arousal/Alertness: Awake/alert Behavior During Therapy: WFL for tasks assessed/performed Overall Cognitive Status: Within Functional Limits for tasks assessed  General Comments      Exercises     Assessment/Plan    PT Assessment Patient needs continued PT services  PT Problem List Decreased strength;Decreased mobility;Decreased activity tolerance;Decreased balance;Decreased knowledge of use of DME;Decreased coordination;Obesity       PT Treatment Interventions DME instruction;Therapeutic activities;Gait training;Therapeutic exercise;Patient/family education;Stair training;Balance training;Functional mobility training    PT Goals (Current goals can be found in the Care Plan section)  Acute Rehab PT Goals PT Goal  Formulation: With patient Time For Goal Achievement: 03/18/19 Potential to Achieve Goals: Good    Frequency Min 3X/week   Barriers to discharge        Co-evaluation               AM-PAC PT "6 Clicks" Mobility  Outcome Measure Help needed turning from your back to your side while in a flat bed without using bedrails?: A Little Help needed moving from lying on your back to sitting on the side of a flat bed without using bedrails?: A Little Help needed moving to and from a bed to a chair (including a wheelchair)?: A Little Help needed standing up from a chair using your arms (e.g., wheelchair or bedside chair)?: A Little Help needed to walk in hospital room?: A Little Help needed climbing 3-5 steps with a railing? : A Lot 6 Click Score: 17    End of Session Equipment Utilized During Treatment: Gait belt Activity Tolerance: Patient tolerated treatment well Patient left: in chair;with call bell/phone within reach;with nursing/sitter in room Nurse Communication: Mobility status PT Visit Diagnosis: Difficulty in walking, not elsewhere classified (R26.2)    Time: 0802-2336 PT Time Calculation (min) (ACUTE ONLY): 18 min   Charges:   PT Evaluation $PT Eval Low Complexity: [place], PT, DPT Acute Rehabilitation Services Office: [phone] Pager: [phone]  Trena Platt 03/04/2019, 1:12 PM

## 2019-03-04 NOTE — Progress Notes (Signed)
Nutrition Brief Note  RD consulted for diet education for patient s/p bariatric surgery. While RDs are working remotely, Insurance risk surveyor providing education.  If nutrition issues arise, please consult RD.   Clayton Bibles, MS, RD, LDN Inpatient Clinical Dietitian Pager: [phone] After Hours Pager: [phone]

## 2019-03-04 NOTE — Progress Notes (Signed)
Patient alert and oriented, Post op day 1.  Provided support and encouragement.  Encouraged pulmonary toilet, ambulation and small sips of liquids.  Completed 12 ounces of bari clear fluids and started protein shake.  All questions answered.  Will continue to monitor.

## 2019-03-05 LAB — CBC WITH DIFFERENTIAL/PLATELET
Abs Immature Granulocytes: 0.03 10*3/uL (ref 0.00–0.07)
Basophils Absolute: 0 10*3/uL (ref 0.0–0.1)
Basophils Relative: 0 %
Eosinophils Absolute: 0 10*3/uL (ref 0.0–0.5)
Eosinophils Relative: 0 %
HCT: 36.9 % — ABNORMAL LOW (ref 39.0–52.0)
Hemoglobin: 11.7 g/dL — ABNORMAL LOW (ref 13.0–17.0)
Immature Granulocytes: 0 %
Lymphocytes Relative: 19 %
Lymphs Abs: 1.3 10*3/uL (ref 0.7–4.0)
MCH: 27.9 pg (ref 26.0–34.0)
MCHC: 31.7 g/dL (ref 30.0–36.0)
MCV: 87.9 fL (ref 80.0–100.0)
Monocytes Absolute: 0.7 10*3/uL (ref 0.1–1.0)
Monocytes Relative: 11 %
Neutro Abs: 4.7 10*3/uL (ref 1.7–7.7)
Neutrophils Relative %: 70 %
Platelets: 227 10*3/uL (ref 150–400)
RBC: 4.2 MIL/uL — ABNORMAL LOW (ref 4.22–5.81)
RDW: 14.6 % (ref 11.5–15.5)
WBC: 6.9 10*3/uL (ref 4.0–10.5)
nRBC: 0 % (ref 0.0–0.2)

## 2019-03-05 LAB — GLUCOSE, CAPILLARY
Glucose-Capillary: 107 mg/dL — ABNORMAL HIGH (ref 70–99)
Glucose-Capillary: 114 mg/dL — ABNORMAL HIGH (ref 70–99)

## 2019-03-05 MED ORDER — ONDANSETRON 4 MG PO TBDP: 4.0000 mg | ORAL_TABLET | Freq: Four times a day (QID) | ORAL | 0 refills | Status: DC | PRN

## 2019-03-05 MED ORDER — ENOXAPARIN SODIUM 40 MG/0.4ML ~~LOC~~ SOLN: 40.0000 mg | Freq: Two times a day (BID) | SUBCUTANEOUS | 0 refills | Status: DC

## 2019-03-05 NOTE — TOC Transition Note (Signed)
Transition of Care [facility]) - CM/SW Discharge Note   Patient Details  Name: Travis LUDINGTON Sr. MRN: [id number] Date of Birth: 25-Nov-1950  Transition of Care [facility]) CM/SW Contact:  Leeroy Cha, RN Phone Number: 03/05/2019, 10:35 AM   Clinical Narrative:    hhc through advanced([facility]) for pt   Final next level of care: Home w Home Health Services Barriers to Discharge: No Barriers Identified   Patient Goals and CMS Choice     Choice offered to / list presented to : Patient  Discharge Placement                       Discharge Plan and Services     Post Acute Care Choice: Home Health                    HH Arranged: PT [facility] Agency: [place] ([facility]) Date [facility] Agency Contacted: 03/05/19 Time [place]: 1034 Representative spoke with at [place]: [place] ([place]) Interventions     Readmission Risk Interventions No flowsheet data found.

## 2019-03-05 NOTE — Progress Notes (Signed)
Patient alert and oriented, pain is controlled. Patient is tolerating fluids, advanced to protein shake today, patient is tolerating well.  Reviewed Gastric sleeve discharge instructions with patient and patient is able to articulate understanding.  Provided information on BELT program, Support Group and [facility] outpatient pharmacy. All questions answered, will continue to monitor.  Total fluid intake 840  Call back one weekpost op

## 2019-03-05 NOTE — Progress Notes (Signed)
Patient alert and oriented, Post op day 2.  Provided support and encouragement.  Encouraged pulmonary toilet, ambulation and small sips of liquids.  All questions answered.  Will continue to monitor. 

## 2019-03-05 NOTE — Discharge Summary (Signed)
Physician Discharge Summary  Travis [name] GLO:[id number] DOB: 01/22/1951 DOA: 03/03/2019  PCP: [name], MD  Admit date: 03/03/2019 Discharge date: 03/05/2019  Recommendations for Outpatient Follow-up:   Follow-up Information    Greer Pickerel, MD. Go on 04/02/2019.   Specialty: General Surgery Why: at 11 am.  Please arrive 15 minutes early for your appointment.   Contact information: [address] [place] [address] [phone]        Carlena Hurl, PA-C. Go on 04/25/2019.   Specialty: General Surgery Why: at 915 am Contact information: [place] [address] [phone]          Discharge Diagnoses:  Principal Problem:   Morbid obesity with BMI of 40.0-44.9, adult (HCC) Active Problems:   Chronic pain syndrome   Essential hypertension   Coronary artery disease involving native coronary artery of native heart without angina pectoris   DISC DISEASE, LUMBAR   Diabetes mellitus type 2 in obese (HCC)   OSA on CPAP   S/P laparoscopic sleeve gastrectomy   Surgical Procedure: Laparoscopic Sleeve Gastrectomy, upper endoscopy  Discharge Condition: Good Disposition: Home  Diet recommendation: Postoperative sleeve gastrectomy diet (liquids only)  Filed Weights   03/03/19 0733  Weight: (!) 147.6 kg     Hospital Course:  The patient was admitted for a planned laparoscopic sleeve gastrectomy. Please see operative note. Preoperatively the patient was given 5000 units of subcutaneous heparin for DVT prophylaxis. Postoperative prophylactic Lovenox dosing was started on the evening of postoperative day 0. ERAS protocol was used. On the evening of postoperative day 0, the patient was started on water and ice chips. On postoperative day 1 the patient had no fever or tachycardia and was tolerating water in their diet was gradually advanced throughout the day. Because of his amputation, a PT consult was obtained and rec home  health PT. I felt he needed another night in the hospital to work with PT. On POD 2  Their vital signs are stable without fever or tachycardia. Their hemoglobin had remained stable. The patient was maintained on their home settings for CPAP therapy. The patient had received discharge instructions and counseling. They were deemed stable for discharge and had met discharge criteria  He was felt to be increased risk for blood clots after discharge based on risk calculator and was sent out on 2 weeks of bid lovenox prophylaxis dose.  He will resume his oral blood thinner after finishing his lovenox injections.   He is on chronic pain medication so didn't receive any addl narcotic rx on discharge  BP (!) 189/71 (BP Location: Right Arm)   Pulse (!) 43   Temp 98.9 F (37.2 C) (Oral)   Resp 18   Ht 6' 1" (1.854 m)   Wt (!) 147.6 kg   SpO2 97%   BMI 42.93 kg/m   Gen: alert, NAD, non-toxic appearing Pupils: equal, no scleral icterus Pulm: Lungs clear to auscultation, symmetric chest rise CV: regular rate and rhythm Abd: soft, min tender, nondistended.  No cellulitis. No incisional hernia Ext: no edema, no calf tenderness; BKA Skin: no rash, no jaundice    Discharge Instructions  Discharge Instructions    Ambulate hourly while awake   Complete by: As directed    Call MD for:  difficulty breathing, headache or visual disturbances   Complete by: As directed    Call MD for:  persistant dizziness or light-headedness   Complete by: As directed  Call MD for:  persistant nausea and vomiting   Complete by: As directed    Call MD for:  redness, tenderness, or signs of infection (pain, swelling, redness, odor or green/yellow discharge around incision site)   Complete by: As directed    Call MD for:  severe uncontrolled pain   Complete by: As directed    Call MD for:  temperature >101 F   Complete by: As directed    Diet bariatric full liquid   Complete by: As directed    Discharge  instructions   Complete by: As directed    See bariatric discharge instructions   Incentive spirometry   Complete by: As directed    Perform hourly while awake     Allergies as of 03/05/2019   No Known Allergies     Medication List    STOP taking these medications   aspirin 81 MG tablet   celecoxib 200 MG capsule Commonly known as: CeleBREX   prasugrel 5 MG Tabs tablet Commonly known as: EFFIENT     TAKE these medications   ARIPiprazole 5 MG tablet Commonly known as: ABILIFY Take 1 tablet (5 mg total) by mouth at bedtime.   atorvastatin 20 MG tablet Commonly known as: LIPITOR Take 1 tablet (20 mg total) by mouth daily.   benztropine 0.5 MG tablet Commonly known as: COGENTIN Take 1 tablet (0.5 mg total) by mouth at bedtime.   clonazePAM 0.5 MG tablet Commonly known as: KLONOPIN Take 1 tablet (0.5 mg total) by mouth at bedtime.   diclofenac sodium 1 % Gel Commonly known as: VOLTAREN Apply 4 g topically 4 (four) times daily. What changed:   when to take this  reasons to take this   DULoxetine 30 MG capsule Commonly known as: CYMBALTA Take 1 capsule (30 mg total) by mouth 2 (two) times daily.   enoxaparin 40 MG/0.4ML injection Commonly known as: LOVENOX Inject 0.4 mLs (40 mg total) into the skin 2 (two) times daily for 14 days.   Fish Oil 1000 MG Caps Take 1,000 mg by mouth at bedtime.   gabapentin 800 MG tablet Commonly known as: NEURONTIN Take 800 mg by mouth 3 (three) times daily.   hydrochlorothiazide 25 MG tablet Commonly known as: HYDRODIURIL Take 25 mg by mouth at bedtime. Notes to patient: Monitor Blood Pressure Daily and keep a log for primary care physician.  Monitor for symptoms of dehydration.  You may need to make changes to your medications with rapid weight loss.     Melatonin 5 MG Tabs Take 20 mg by mouth at bedtime.   methocarbamol 500 MG tablet Commonly known as: ROBAXIN Take 1 tablet (500 mg total) by mouth every 8 (eight)  hours as needed for muscle spasms.   Myrbetriq 50 MG Tb24 tablet Generic drug: mirabegron ER Take 50 mg by mouth at bedtime.   ondansetron 4 MG disintegrating tablet Commonly known as: ZOFRAN-ODT Take 1 tablet (4 mg total) by mouth every 6 (six) hours as needed for nausea or vomiting.   oxymetazoline 0.05 % nasal spray Commonly known as: AFRIN Place 1 spray into both nostrils 2 (two) times daily as needed for congestion.   pantoprazole 40 MG tablet Commonly known as: PROTONIX TAKE ONE TABLET BY MOUTH EVERY DAY   sildenafil 100 MG tablet Commonly known as: VIAGRA Take 100 mg by mouth daily.   traZODone 150 MG tablet Commonly known as: DESYREL Take 1 tablet (150 mg total) by mouth at bedtime.   Zenpep 27782-42353  units Cpep Generic drug: Pancrelipase (Lip-Prot-Amyl) Take 40,000 Units by mouth 3 (three) times daily with meals. Take 2 capsules with meals and 1 capsule with snacks What changed:   how much to take  when to take this  additional instructions      Follow-up Information    Greer Pickerel, MD. Go on 04/02/2019.   Specialty: General Surgery Why: at 11 am.  Please arrive 15 minutes early for your appointment.   Contact information: [address] [place] [address] [phone]        Carlena Hurl, PA-C. Go on 04/25/2019.   Specialty: General Surgery Why: at 915 am Contact information: [name] [address] [phone]            The results of significant diagnostics from this hospitalization (including imaging, microbiology, ancillary and laboratory) are listed below for reference.    Significant Diagnostic Studies: Ct Abdomen W Contrast  Result Date: 02/11/2019 CLINICAL DATA:  Change in bowel habits, evaluate pancreas EXAM: CT ABDOMEN WITH CONTRAST TECHNIQUE: Multidetector CT imaging of the abdomen was performed using the standard protocol following bolus administration of intravenous contrast. CONTRAST:   15m OMNIPAQUE IOHEXOL 300 MG/ML  SOLN COMPARISON:  None. FINDINGS: Motion degraded images. Lower chest: Lung bases are clear. Three vessel coronary atherosclerosis. Hepatobiliary: Liver is within normal limits. Gallbladder is unremarkable. No intrahepatic or extrahepatic duct dilatation. Pancreas: Fatty parenchymal atrophy, within normal limits. No parenchymal mass or ductal dilatation. Spleen: Within normal limits. Adrenals/Urinary Tract: Adrenal glands are within normal limits. Subcentimeter anterior right lower pole renal cyst. Left kidney is within normal limits. No hydronephrosis. Stomach/Bowel: Stomach is within normal limits. Visualized bowel is unremarkable. Vascular/Lymphatic: No evidence of abdominal aortic aneurysm. Mild atherosclerotic calcifications of the common iliac arteries. No suspicious abdominal lymphadenopathy. Other: No abdominal ascites. Musculoskeletal: Degenerative changes the visualized thoracolumbar spine. Topogram is notable for a right hip arthroplasty. IMPRESSION: Unremarkable CT abdomen. Specifically, the pancreas is within normal limits. Please note that the pelvis was not imaged. Electronically Signed   By: SJulian HyM.D.   On: 02/11/2019 08:33    Labs: Basic Metabolic Panel: Recent Labs  Lab 02/27/19 1536 03/04/19 0420  NA 139 136  K 4.0 4.4  CL 104 102  CO2 25 26  GLUCOSE 107* 115*  BUN 32* 18  CREATININE 1.03 0.96  CALCIUM 9.0 8.8*   Liver Function Tests: Recent Labs  Lab 02/27/19 1536 03/04/19 0420  AST 20 28  ALT 15 19  ALKPHOS 67 58  BILITOT 0.8 0.9  PROT 7.3 6.6  ALBUMIN 4.5 3.8    CBC: Recent Labs  Lab 02/27/19 1536 03/03/19 1212 03/04/19 0420 03/04/19 1148 03/05/19 0409  WBC 5.5  --  8.7 7.8 6.9  NEUTROABS  --   --  6.9  --  4.7  HGB 12.0* 13.3 11.9* 12.0* 11.7*  HCT 38.2* 43.0 38.2* 35.3* 36.9*  MCV 87.2  --  88.8 83.6 87.9  PLT 250  --  242 229 227    CBG: Recent Labs  Lab 03/04/19 1540 03/04/19 1955  03/04/19 2340 03/05/19 0340 03/05/19 0749  GLUCAP 112* 107* 127* 107* 114*    Principal Problem:   Morbid obesity with BMI of 40.0-44.9, adult (HCC) Active Problems:   Chronic pain syndrome   Essential hypertension   Coronary artery disease involving native coronary artery of native heart without angina pectoris   DISC DISEASE, LUMBAR   Diabetes mellitus type 2 in  obese ([place])   OSA on CPAP   S/P laparoscopic sleeve gastrectomy   Time coordinating discharge: 20 min  Signed:  Gayland Curry, MD [facility], [place] [phone] 03/05/2019, 9:13 AM

## 2019-03-05 NOTE — Discharge Instructions (Signed)
DO NOT TAKE BLOOD THINNER (EFFIENT/PRASUGREL) FOR NOW. CAN RESUME WHEN FINISH LOVENOX INJECTIONS IN 2 WEEKS        GASTRIC BYPASS/SLEEVE  Home Care Instructions   These instructions are to help you care for yourself when you go home.  Call: If you have any problems.  Call [phone] and ask for the surgeon on call  If you need immediate help, come to the ER at [facility].   Tell the ER staff that you are a new post-op gastric bypass or gastric sleeve patient   Signs and symptoms to report:  Severe vomiting or nausea o If you cannot keep down clear liquids for longer than 1 day, call your surgeon   Abdominal pain that does not get better after taking your pain medication  Fever over 100.4 F with chills  Heart beating over 100 beats a minute  Shortness of breath at rest  Chest pain   Redness, swelling, drainage, or foul odor at incision (surgical) sites   If your incisions open or pull apart  Swelling or pain in calf (lower leg)  Diarrhea (Loose bowel movements that happen often), frequent watery, uncontrolled bowel movements  Constipation, (no bowel movements for 3 days) if this happens: Pick one o Milk of Magnesia, 2 tablespoons by mouth, 3 times a day for 2 days if needed o Stop taking Milk of Magnesia once you have a bowel movement o Call your doctor if constipation continues Or o Miralax  (instead of Milk of Magnesia) following the label instructions o Stop taking Miralax once you have a bowel movement o Call your doctor if constipation continues  Anything you think is not normal   Normal side effects after surgery:  Unable to sleep at night or unable to focus  Irritability or moody  Being tearful (crying) or depressed These are common complaints, possibly related to your anesthesia medications that put you to sleep, stress of surgery, and change in lifestyle.  This usually goes away a few weeks after surgery.  If these feelings continue, call your  primary care doctor.   Wound Care: You may have surgical glue, steri-strips, or staples over your incisions after surgery  Surgical glue:  Looks like a clear film over your incisions and will wear off a little at a time  Steri-strips: Strips of tape over your incisions. You may notice a yellowish color on the skin under the steri-strips. This is used to make the   steri-strips stick better. Do not pull the steri-strips off - let them fall off  Staples: Staples may be removed before you leave the hospital o If you go home with staples, call [facility], [phone]) [phone] at for an appointment with your surgeons nurse to have staples removed 10 days after surgery.  Showering: You may shower two (2) days after your surgery unless your surgeon tells you differently o Wash gently around incisions with warm soapy water, rinse well, and gently pat dry  o No tub baths until staples are removed, steri-strips fall off or glue is gone.    Medications:  Medications should be liquid or crushed if larger than the size of a dime  Extended release pills (medication that release a little bit at a time through the day) should NOT be crushed or cut. (examples include XL, ER, DR, SR)  Depending on the size and number of medications you take, you may need to space (take a few throughout the day)/change the time you take your medications  so that you do not over-fill your pouch (smaller stomach)  Make sure you follow-up with your primary care doctor to make medication changes needed during rapid weight loss and life-style changes  If you have diabetes, follow up with the doctor that orders your diabetes medication(s) within one week after surgery and check your blood sugar regularly.  Do not drive while taking prescription pain medication   It is ok to take Tylenol by the bottle instructions with your pain medicine or instead of your pain medicine as needed.  DO NOT TAKE NSAIDS (EXAMPLES OF NSAIDS:   IBUPROFREN/ NAPROXEN)  Diet:                    First 2 Weeks  You will see the dietician t about two (2) weeks after your surgery. The dietician will increase the types of foods you can eat if you are handling liquids well:  If you have severe vomiting or nausea and cannot keep down clear liquids lasting longer than 1 day, call your surgeon @ [phone]) Protein Shake  Drink at least 2 ounces of shake 5-6 times per day  Each serving of protein shakes (usually 8 - 12 ounces) should have: o 15 grams of protein  o And no more than 5 grams of carbohydrate   Goal for protein each day: o Men = 80 grams per day o Women = 60 grams per day  Protein powder may be added to fluids such as non-fat milk or Lactaid milk or unsweetened Soy/Almond milk (limit to 35 grams added protein powder per serving)  Hydration  Slowly increase the amount of water and other clear liquids as tolerated (See Acceptable Fluids)  Slowly increase the amount of protein shake as tolerated    Sip fluids slowly and throughout the day.  Do not use straws.  May use sugar substitutes in small amounts (no more than 6 - 8 packets per day; i.e. Splenda)  Fluid Goal  The first goal is to drink at least 8 ounces of protein shake/drink per day (or as directed by the nutritionist); some examples of protein shakes are [employer], [employer], EAS Edge HP, and Unjury. See handout from pre-op Bariatric Education Class: o Slowly increase the amount of protein shake you drink as tolerated o You may find it easier to slowly sip shakes throughout the day o It is important to get your proteins in first  Your fluid goal is to drink 64 - 100 ounces of fluid daily o It may take a few weeks to build up to this  32 oz (or more) should be clear liquids  And   32 oz (or more) should be full liquids (see below for examples)  Liquids should not contain sugar, caffeine, or carbonation  Clear Liquids:  Water or Sugar-free  flavored water (i.e. Fruit H2O, Propel)  Decaffeinated coffee or tea (sugar-free)  Crystal Lite, Wylers Lite, Minute Maid Lite  Sugar-free Jell-O  Bouillon or broth  Sugar-free Popsicle:   *Less than 20 calories each; Limit 1 per day  Full Liquids: Protein Shakes/Drinks + 2 choices per day of other full liquids  Full liquids must be: o No More Than 15 grams of Carbs per serving  o No More Than 3 grams of Fat per serving  Strained low-fat cream soup (except Cream of Potato or Tomato)  Non-Fat milk  Fat-free Lactaid Milk  Unsweetened Soy Or Unsweetened Almond Milk  Low Sugar yogurt (Dannon Lite & Fit, [place]  yogurt; Oikos Triple Zero; Chobani Simply 100; Yoplait 100 calorie [place] - No Fruit on the Bottom)    Vitamins and Minerals  Start 1 day after surgery unless otherwise directed by your surgeon  2 Chewable Bariatric Specific Multivitamin / Multimineral Supplement with iron (Example: Bariatric Advantage Multi EA)  Chewable Calcium with Vitamin D-3 (Example: 3 Chewable Calcium Plus 600 with Vitamin D-3) o Take 500 mg three (3) times a day for a total of 1500 mg each day o Do not take all 3 doses of calcium at one time as it may cause constipation, and you can only absorb 500 mg  at a time  o Do not mix multivitamins containing iron with calcium supplements; take 2 hours apart  Menstruating women and those with a history of anemia (a blood disease that causes weakness) may need extra iron o Talk with your doctor to see if you need more iron  Do not stop taking or change any vitamins or minerals until you talk to your dietitian or surgeon  Your Dietitian and/or surgeon must approve all vitamin and mineral supplements   Activity and Exercise: Limit your physical activity as instructed by your doctor.  It is important to continue walking at home.  During this time, use these guidelines:  Do not lift anything greater than ten (10) pounds for at least two (2) weeks  Do  not go back to work or drive until Engineer, production says you can  You may have sex when you feel comfortable  o It is VERY important for male patients to use a reliable birth control method; fertility often increases after surgery  o All hormonal birth control will be ineffective for 30 days after surgery due to medications given during surgery a barrier method must be used. o Do not get pregnant for at least 18 months  Start exercising as soon as your doctor tells you that you can o Make sure your doctor approves any physical activity  Start with a simple walking program  Walk 5-15 minutes each day, 7 days per week.   Slowly increase until you are walking 30-45 minutes per day Consider joining our [place] program. 8200094709 or email [email or web address]   Special Instructions Things to remember:  Use your CPAP when sleeping if this applies to you   [facility] has two free Bariatric Surgery Support Groups that meet monthly o The 3rd Thursday of each month, 6 pm, [facility]  o The 2nd Friday of each month, 11:45 am in the private dining room in the basement of [place]  It is very important to keep all follow up appointments with your surgeon, dietitian, primary care physician, and behavioral health practitioner  Routine follow up schedule with your surgeon include appointments at 2-3 weeks, 6-8 weeks, 6 months, and 1 year at a minimum.  Your surgeon may request to see you more often.   o After the first year, please follow up with your bariatric surgeon and dietitian at least once a year in order to maintain best weight loss results [facility]: [place]: [phone] Bariatric Nurse Coordinator: [phone]      Reviewed and Endorsed  by [facility] Patient Education Committee, June, 2016 Edits Approved: Aug, 2018

## 2019-03-05 NOTE — Consult Note (Signed)
   [facility] CM Inpatient Consult   03/05/2019  [name] [birth year] [id number]    Patient screened for potential [facility] Care Management services due to unplanned readmission risk score of 16%. Patient had been active in the past with [facility] CM social worker for assistance with transportation needs.   Mr. Dunstan is a member of [employer] plan and in the [place] (SNP) as he has medicaid as well. This special needs program for [place] has case management services. [facility] CM does not follow.  Netta Cedars, MSN, [facility] Liaison Nurse Mobile Phone [phone]  Toll free office [phone]

## 2019-03-05 NOTE — Progress Notes (Signed)
Discharge instructions given to pt and all questions were answered.  

## 2019-03-06 ENCOUNTER — Telehealth: Payer: Self-pay | Admitting: *Deleted

## 2019-03-06 NOTE — Telephone Encounter (Signed)
Pt was on TCM report admitted 03/03/19 for a planned laparoscopic sleeve gastrectomy. Tolerated procedure well, and D/C 03/05/19. Pt will follow-up w/surgeon Greer Pickerel, MD. Go on 04/02/2019.Marland KitchenJohny Chess

## 2019-03-10 ENCOUNTER — Telehealth (HOSPITAL_COMMUNITY): Payer: Self-pay

## 2019-03-10 NOTE — Telephone Encounter (Addendum)
Patient called to discuss post bariatric surgery follow up questions. Left voice mail for return call 03/10/2019 at 1212 pm.  See below:   1.  Tell me about your pain and pain management?denies  2.  Let's talk about fluid intake.  How much total fluid are you taking in?100 ounces of fluid  3.  How much protein have you taken in the last 2 days?90 grams  4.  Have you had nausea?  Tell me about when have experienced nausea and what you did to help?denies  5.  Has the frequency or color changed with your urine?urine has no color  6.  Tell me what your incisions look like?no problesm  7.  Have you been passing gas? BM?had 2 bms since discharge  8.  If a problem or question were to arise who would you call?  Do you know contact numbers for [facility], and [facility]?aware of all services  9.  How has the walking going?PT going well, no needs, up and walking in home  10.  How are your vitamins and calcium going?  How are you taking them?no problems with mvi and calcium;

## 2019-03-11 ENCOUNTER — Other Ambulatory Visit: Payer: Self-pay

## 2019-03-11 ENCOUNTER — Other Ambulatory Visit (INDEPENDENT_AMBULATORY_CARE_PROVIDER_SITE_OTHER): Payer: Self-pay | Admitting: Specialist

## 2019-03-11 NOTE — Patient Outreach (Signed)
[facility]) Care Management  03/11/2019  DOMENIQUE SOUTHERS Sr. [birth year] [id number]     EMMI-General Discharge RED ON EMMI ALERT Day # 4 Date: 03/10/2019 Red Alert Reason: "Lost interest in things? Yes"   Outreach attempt # 1 to patient. Spoke with patient who denies any acute issues or concerns. Reviewed and addressed red alert with patient. Patient reports that machine recorded response in error. He has had not a lost of interest or felt down/sad. He reports he has been feeling the complete opposite. He voices that he is "feeling and doing great." Incision is healing and looking good. He is tolerating liquid diet. He has all his meds. Follow up appts were made prior to discharge and he denies any issues with transportation. Patient has completed post discharge automated calls. He declines RN CM or [facility] assistance at this time.    Plan: RN CM will close case at this time.  Enzo Montgomery, RN,BSN,CCM [place] Management Telephonic Care Management Coordinator Direct Phone: [phone] Toll Free: [phone] Fax: [phone]

## 2019-03-12 DIAGNOSIS — R6889 Other general symptoms and signs: Secondary | ICD-10-CM | POA: Diagnosis not present

## 2019-03-13 DIAGNOSIS — R6889 Other general symptoms and signs: Secondary | ICD-10-CM | POA: Diagnosis not present

## 2019-03-13 DIAGNOSIS — M47816 Spondylosis without myelopathy or radiculopathy, lumbar region: Secondary | ICD-10-CM | POA: Diagnosis not present

## 2019-03-17 ENCOUNTER — Other Ambulatory Visit: Payer: Self-pay | Admitting: Cardiovascular Disease

## 2019-03-18 ENCOUNTER — Other Ambulatory Visit: Payer: Self-pay

## 2019-03-18 ENCOUNTER — Encounter: Payer: Medicare HMO | Attending: General Surgery | Admitting: Skilled Nursing Facility1

## 2019-03-18 ENCOUNTER — Ambulatory Visit (INDEPENDENT_AMBULATORY_CARE_PROVIDER_SITE_OTHER): Payer: Medicare HMO | Admitting: Psychology

## 2019-03-18 DIAGNOSIS — F332 Major depressive disorder, recurrent severe without psychotic features: Secondary | ICD-10-CM

## 2019-03-18 DIAGNOSIS — Z713 Dietary counseling and surveillance: Secondary | ICD-10-CM | POA: Diagnosis not present

## 2019-03-18 DIAGNOSIS — Z6841 Body Mass Index (BMI) 40.0 and over, adult: Secondary | ICD-10-CM | POA: Insufficient documentation

## 2019-03-18 DIAGNOSIS — E669 Obesity, unspecified: Secondary | ICD-10-CM

## 2019-03-18 DIAGNOSIS — R6889 Other general symptoms and signs: Secondary | ICD-10-CM | POA: Diagnosis not present

## 2019-03-19 NOTE — Progress Notes (Addendum)
2 Week Post-Operative Nutrition Class   Patient was seen on 07/16/18 for Post-[facility]ative Nutrition [facility]    Surgery date: 03/03/2019 Surgery type: sleeve Start weight at [facility]: 317.8 (this weight is with his prosthesis) Weight today: pt arrived too late   Body Composition Scale Date  Total Body Fat %   Visceral Fat   Fat-Free Mass %    Total Body Water %    Muscle-Mass lbs   Body Fat Displacement          Torso  lbs          Left Leg  lbs          Right Leg  lbs          Left Arm  lbs          Right Arm   lbs      The following the learning objectives were met by the patient during this course:  Identifies Phase 3 (Soft, High Proteins) Dietary Goals and will begin from 2 weeks post-operatively to 2 months post-operatively  Identifies appropriate sources of fluids and proteins   States protein recommendations and appropriate sources post-operatively  Identifies the need for appropriate texture modifications, mastication, and bite sizes when consuming solids  Identifies appropriate multivitamin and calcium sources post-operatively  Describes the need for physical activity post-operatively and will follow MD recommendations  States when to call healthcare provider regarding medication questions or post-operative complications   Handouts given during class include:  Phase 3A: Soft, High Protein Diet Handout   Follow-Up Plan: Patient will follow-up at [facility] in 6 weeks for 2 month post-op nutrition visit for diet advancement per MD.

## 2019-03-24 ENCOUNTER — Other Ambulatory Visit: Payer: Self-pay

## 2019-03-24 ENCOUNTER — Ambulatory Visit (INDEPENDENT_AMBULATORY_CARE_PROVIDER_SITE_OTHER): Payer: Medicare HMO | Admitting: Orthopedic Surgery

## 2019-03-24 ENCOUNTER — Ambulatory Visit: Payer: Medicare HMO | Admitting: Internal Medicine

## 2019-03-24 ENCOUNTER — Telehealth: Payer: Self-pay | Admitting: Skilled Nursing Facility1

## 2019-03-24 ENCOUNTER — Ambulatory Visit (INDEPENDENT_AMBULATORY_CARE_PROVIDER_SITE_OTHER): Payer: Medicare HMO

## 2019-03-24 ENCOUNTER — Encounter: Payer: Self-pay | Admitting: Orthopedic Surgery

## 2019-03-24 VITALS — Ht 73.0 in | Wt 325.0 lb

## 2019-03-24 DIAGNOSIS — M25552 Pain in left hip: Secondary | ICD-10-CM

## 2019-03-24 DIAGNOSIS — R6889 Other general symptoms and signs: Secondary | ICD-10-CM | POA: Diagnosis not present

## 2019-03-24 NOTE — Telephone Encounter (Signed)
RD called pt to verify fluid intake once starting soft, solid proteins 2 week post-bariatric surgery.   Daily Fluid intake: 64 Daily Protein intake: 80+  Concerns/issues:   None stated

## 2019-03-24 NOTE — Progress Notes (Signed)
Office Visit Note   Patient: Travis AYRE Sr.           Date of Birth: 1950/09/06           MRN: [id number] Visit Date: 03/24/2019              Requested by: [name], MD [address] [place],  [place] [address] PCP: [name], MD  Chief Complaint  Patient presents with  . Left Hip - Pain      HPI: Patient is a 68 year old gentleman who presents with left groin pain.  Patient has pain with weightbearing pain with activities of daily living.  He is status post a right total hip arthroplasty a year ago and he states the right hip is doing fine.  He has a prosthesis on the right side with a right transtibial amputation.  Patient states that he had bariatric surgery 3 weeks ago and has already lost 13 pounds.  Assessment & Plan: Visit Diagnoses:  1. Pain in left hip     Plan: Will set the patient up for a left hip injection with Dr. Ernestina Patches and follow-up with Dr. Ninfa Linden anticipate patient will need a left total hip arthroplasty.  Follow-Up Instructions: Return in about 1 week (around 03/31/2019) for Plan to follow-up with Dr. Ninfa Linden and follow-up with Dr. Ernestina Patches for left hip injection..   Patient states that he is going to be out of town from December 18 through January 11 in [place].  Ortho Exam  Patient is alert, oriented, no adenopathy, well-dressed, normal affect, normal respiratory effort. Examination patient has 0 degrees of internal rotation of left hip about 10 degrees of external rotation of the left hip.  He has a negative sciatic tension sign with straight leg raise no focal motor weakness in the left lower extremity.  Patient denies any radicular pain he states he is going to undergo an epidural steroid injection this Thursday for his lumbar spine.  He does have degenerative disc disease in the lower lumbar spine that is seen on the hip radiographs.  Patient ambulates with a cane.  Imaging: Xr Hip Unilat W Or W/o Pelvis 2-3 Views Left  Result  Date: 03/24/2019 2 view radiographs of the left hip shows arthritis with some calcification of the gluteus medius.  Patient has bone-on-bone contact with periarticular bony spurs.  Radiographs of the right hip shows a stable total hip arthroplasty no complicating features.  The leg length of the left leg is short.  No images are attached to the encounter.  Labs: Lab Results  Component Value Date   HGBA1C 5.8 (A) 01/24/2019   HGBA1C 5.2 06/03/2018   HGBA1C 5.5 01/10/2018   ESRSEDRATE 22 (H) 09/03/2018   ESRSEDRATE 35 (H) 08/15/2018   ESRSEDRATE 20 (H) 09/03/2013   CRP 7.3 09/03/2018   CRP 14.1 (H) 08/15/2018   CRP 10.7 (H) 09/03/2013   REPTSTATUS 08/16/2018 FINAL 08/15/2018   GRAMSTAIN  09/02/2013    WBC PRESENT, PREDOMINANTLY MONONUCLEAR NO ORGANISMS SEEN CYTOSPIN Performed by [facility] Performed at [facility]  09/02/2013    NO ORGANISMS SEEN WBC PRESENT, PREDOMINANTLY MONONUCLEAR CYTOSPIN Gram Stain Report Called to,Read Back By and Verified With: A LEDWELL RN 1933 09/02/13 A NAVARRO   CULT (A) 08/15/2018    <10,000 COLONIES/mL INSIGNIFICANT GROWTH Performed at [facility], [place] [address] [place], [place] [address]    LABORGA STAPHYLOCOCCUS SPECIES (COAGULASE NEGATIVE) 04/08/2015  Lab Results  Component Value Date   ALBUMIN 3.8 03/04/2019   ALBUMIN 4.5 02/27/2019   ALBUMIN 3.4 (L) 08/15/2018    Lab Results  Component Value Date   MG 1.5 (L) 08/15/2018   MG 1.9 05/03/2013   No results found for: VD25OH  No results found for: PREALBUMIN CBC EXTENDED Latest Ref Rng & Units 03/05/2019 03/04/2019 03/04/2019  WBC 4.0 - 10.5 K/uL 6.9 7.8 8.7  RBC 4.22 - 5.81 MIL/uL 4.20(L) 4.22 4.30  HGB 13.0 - 17.0 g/dL 11.7(L) 12.0(L) 11.9(L)  HCT 39.0 - 52.0 % 36.9(L) 35.3(L) 38.2(L)  PLT 150 - 400 K/uL 227 229 242  NEUTROABS 1.7 - 7.7 K/uL 4.7 - 6.9  LYMPHSABS 0.7 - 4.0 K/uL 1.3 - 0.9     Body mass index is 42.88 kg/m.  Orders:   Orders Placed This Encounter  Procedures  . XR HIP UNILAT W OR W/O PELVIS 2-3 VIEWS LEFT  . Ambulatory referral to Physical Medicine Rehab   No orders of the defined types were placed in this encounter.    Procedures: No procedures performed  Clinical Data: No additional findings.  ROS:  All other systems negative, except as noted in the HPI. Review of Systems  Objective: Vital Signs: Ht 6' 1"  (1.854 m)   Wt (!) 325 lb (147.4 kg)   BMI 42.88 kg/m   Specialty Comments:  No specialty comments available.  PMFS History: Patient Active Problem List   Diagnosis Date Noted  . S/P laparoscopic sleeve gastrectomy 03/03/2019  . Rash 01/24/2019  . Fever 08/15/2018  . UTI (urinary tract infection) 08/15/2018  . Atypical chest pain 08/15/2018  . Fall at home, initial encounter 08/15/2018  . Chronic anemia 08/15/2018  . Hypokalemia 08/15/2018  . Bowel incontinence 07/25/2018  . Other spondylosis with radiculopathy, lumbar region 07/16/2018    Class: Chronic  . Status post lumbar laminectomy 07/16/2018  . Status post THR (total hip replacement) 04/17/2018  . Unilateral primary osteoarthritis, right hip   . Morbid obesity with BMI of 40.0-44.9, adult ([place]) 02/28/2018  . Myocardial infarction ([place]) 02/25/2018  . Coronary artery disease involving native coronary artery of native heart 02/25/2018  . PAD (peripheral artery disease) ([place]) 02/25/2018  . S/P BKA (below knee amputation) unilateral, right ([place]) 02/25/2018  . Sacroiliitis ([place]) 02/25/2018  . SOB (shortness of breath) 01/03/2018  . Acute on chronic heart failure ([place]) 01/03/2018  . Severe right groin pain 12/20/2017  . Morbid obesity ([place]) 10/09/2017  . Exposure to sexually transmitted disease (STD) 07/14/2017  . Low back pain 07/13/2017  . Leukoplakia, tongue 01/26/2017  . Acute sinus infection 10/20/2016  . Hyperglycemia 10/20/2016  . Skin lesion 10/20/2016  . S/P unilateral BKA (below knee amputation), right  ([place]) 06/14/2016  . Charcot foot due to diabetes mellitus ([place])   . Charcot's arthropathy associated with type 2 diabetes mellitus ([place]) 04/11/2016  . Preventative health care 11/05/2015  . Major depression 09/13/2015  . S/P TKR (total knee replacement) bilaterally 09/13/2015  . GERD (gastroesophageal reflux disease) 09/08/2015  . S/P laparoscopic hernia repair 05/11/2015  . PPD positive 04/08/2015  . Benign neoplasm of descending colon   . Benign neoplasm of cecum   . Acute blood loss anemia   . Chronic anticoagulation   . Occult blood positive stool 10/17/2014  . General weakness 07/14/2014  . Urinary incontinence 07/14/2014  . Headache(784.0) 10/15/2013  . Spinal stenosis in cervical region 09/26/2013    Class: Chronic  . Spinal stenosis of lumbar region  09/26/2013    Class: Chronic  . Hand joint pain 06/10/2013  . Rotator cuff tear arthropathy of both shoulders 06/10/2013  . Skin lesion of cheek 05/01/2013  . Pain of right thumb 04/03/2013  . Balance disorder 03/12/2013  . Gait disorder 03/12/2013  . Tremor 03/12/2013  . Left hip pain 03/12/2013  . Pre-ulcerative corn or callous 02/06/2013  . Anxiety 11/12/2011  . OSA on CPAP 11/07/2011  . Bradycardia 10/20/2011  . Insomnia 10/04/2011  . Obesity 01/12/2011  . Diabetes mellitus type 2 in obese ([place]) 09/27/2010  . ERECTILE DYSFUNCTION, ORGANIC 05/30/2010  . [place] DISEASE, LUMBAR 04/19/2010  . SCIATICA, LEFT 04/19/2010  . Chronic pain syndrome 10/27/2009  . Hyperlipidemia 07/15/2009  . Essential hypertension 06/24/2009  . Coronary artery disease involving native coronary artery of native heart without angina pectoris 06/24/2009  . Allergic rhinitis 06/24/2009  . URETHRAL STRICTURE 06/24/2009  . DEGENERATIVE JOINT DISEASE 06/24/2009  . SHOULDER PAIN, BILATERAL 06/24/2009  . FATIGUE 06/24/2009  . NEPHROLITHIASIS, HX OF 06/24/2009   Past Medical History:  Diagnosis Date  . ALLERGIC RHINITIS 06/24/2009  . Allergy   .  Anxiety 11/12/2011   Adequate for discharge   . Arthritis    "all my joints" (09/30/2013)  . Arthritis of foot, right, degenerative 04/15/2014  . Balance disorder 03/12/2013  . Benign neoplasm of cecum   . Benign neoplasm of descending colon   . CAD (coronary artery disease) 06/24/2009   5 stents placed in 2007    . Chronic anticoagulation   . Chronic pain syndrome 10/27/2009   of ankle, shoulders, low back.  sciatica.   . Closed fracture of right foot 10/17/2014  . CORONARY ARTERY DISEASE 06/24/2009   a. s/p multiple PCIs - In 2008 he had a Taxus DES to the mild LAD, Endeavor DES to mid LCX and distal LCX. In January 2009 he had DES to distal LCX, mid LCX and proximal LCX. In November 2009 had BMS x 2 to the mid RCA. Cath 10/2011 with patent stents, noncardiac CP. LHC 01/2013: patent stents (noncardiac CP).  . DEGENERATIVE JOINT DISEASE 06/24/2009   Qualifier: Diagnosis of  By: [name], [name]   . Depression   . Depression with anxiety    Prior suicide attempt  . [place] DISEASE, LUMBAR 04/19/2010  . ERECTILE DYSFUNCTION, ORGANIC 05/30/2010  . Essential hypertension 06/24/2009   Qualifier: Diagnosis of  By: [name], [name]   . Fibromyalgia   . Fracture dislocation of ankle joint 09/02/2015  . Gait disorder 03/12/2013  . General weakness 07/14/2014  . GERD (gastroesophageal reflux disease) 09/08/2015  . Hand joint pain 06/10/2013  . Heart murmur   . Hepatitis C    treated pt. unknown with what he was a teenager  . History of kidney stones   . Hyperlipidemia 07/15/2009   Qualifier: Diagnosis of  By: Aundra Dubin, MD, [place]    . HYPERLIPIDEMIA-MIXED 07/15/2009  . HYPERTENSION 06/24/2009  . Insomnia 10/04/2011  . Irregular heart beat   . Left hip pain 03/12/2013   Injected under ultrasound guidance on June 24, 2013   . Major depression 09/13/2015  . Myocardial infarction ([place]) 2008  . Non-cardiac chest pain 10/2011, 01/2013  . Obesity   . Occult blood positive stool 10/17/2014  . Open ankle fracture  09/02/2015  . OSA (obstructive sleep apnea)    not using CPAP (09/30/2013)  . Pain of right thumb 04/03/2013  . Pneumonia   . PPD positive 04/08/2015  . Pre-ulcerative  corn or callous 02/06/2013  . Rotator cuff tear arthropathy of both shoulders 06/10/2013   History of bilateral shoulder cuff surgery for rotator cuff tears. Reports increase in pain 09/11/2015 during physical therapy of the left shoulder.   [name] SCIATICA, LEFT 04/19/2010   Qualifier: Diagnosis of  By: [name], [name]   . Sleep apnea    wears cpap   . Spinal stenosis in cervical region 09/26/2013  . Spinal stenosis, lumbar region, with neurogenic claudication 09/26/2013  . Type II diabetes mellitus ([place]) 07/21/10   no meds in 09/2014.   [name] Uncontrolled type 2 DM with peripheral circulatory disorder ([place]) 10/04/2013  . URETHRAL STRICTURE 06/24/2009   self catheterizes.     Family History  Problem Relation Age of Onset  . Depression Mother   . Heart disease Mother   . Hypertension Mother   . Breast cancer Mother   . Cancer Mother   . Diabetes Father   . Heart disease Father        CABG  . Hypertension Father   . Hyperlipidemia Father   . Prostate cancer Father   . Skin cancer Father   . Depression Brother        x 2  . Hypertension Brother        x2  . Healthy Son   . Heart disease Maternal Grandfather   . Early death Maternal Grandfather   . Heart attack Maternal Grandfather 22-Jul-2063  . Early death Paternal Grandfather   . Coronary artery disease Other   . Hypertension Other   . Depression Other   . Healthy Son   . Colon cancer Neg Hx   . Colon polyps Neg Hx   . Esophageal cancer Neg Hx   . Rectal cancer Neg Hx   . Stomach cancer Neg Hx     Past Surgical History:  Procedure Laterality Date  . AMPUTATION Right 06/14/2016   Procedure: AMPUTATION BELOW KNEE;  Surgeon: Newt Minion, MD;  Location: [facility];  Service: Orthopedics;  Laterality: Right;  . ANKLE FUSION Right 04/15/2014   Procedure: Right Subtalar, Talonavicular  Fusion;  Surgeon: Newt Minion, MD;  Location: [facility];  Service: Orthopedics;  Laterality: Right;  . ANKLE FUSION Right 04/18/2016   Procedure: Right Ankle Tibiocalcaneal Fusion;  Surgeon: Newt Minion, MD;  Location: [facility];  Service: Orthopedics;  Laterality: Right;  . ANTERIOR CERVICAL DECOMP/DISCECTOMY FUSION N/A 09/26/2013   Procedure: ANTERIOR CERVICAL DISCECTOMY FUSION C3-4, plate and screw fixation, allograft bone graft;  Surgeon: Jessy Oto, MD;  Location: [facility];  Service: Orthopedics;  Laterality: N/A;  . BACK SURGERY    . BELOW KNEE LEG AMPUTATION Right 06/14/2016   right ankle and foot  . CARDIAC CATHETERIZATION  X 1  . CARPAL TUNNEL RELEASE Bilateral   . COLONOSCOPY    . COLONOSCOPY N/A 10/22/2014   Procedure: COLONOSCOPY;  Surgeon: Lafayette Dragon, MD;  Location: [facility] ENDOSCOPY;  Service: Endoscopy;  Laterality: N/A;  . CORONARY ANGIOPLASTY WITH STENT PLACEMENT     "I have 9 stents"  . ESOPHAGOGASTRODUODENOSCOPY N/A 10/19/2014   Procedure: ESOPHAGOGASTRODUODENOSCOPY (EGD);  Surgeon: Jerene Bears, MD;  Location: [facility] ENDOSCOPY;  Service: Endoscopy;  Laterality: N/A;  . FRACTURE SURGERY    . FUSION OF TALONAVICULAR JOINT Right 04/15/2014   dr duda  . HERNIA REPAIR     umbilical  . INGUINAL HERNIA REPAIR Right 05/11/2015   Procedure: LAPAROSCOPIC REPAIR RIGHT  INGUINAL HERNIA;  Surgeon: [name]  Redmond Pulling, MD;  Location: [facility];  Service: General;  Laterality: Right;  . INSERTION OF MESH Right 05/11/2015   Procedure: INSERTION OF MESH;  Surgeon: Greer Pickerel, MD;  Location: [facility];  Service: General;  Laterality: Right;  . JOINT REPLACEMENT    . KNEE CARTILAGE SURGERY Right X 12   "~ 1/2 open; ~ 1/2 scopes"  . KNEE CARTILAGE SURGERY Left X 3   "3 scopes"  . LAPAROSCOPIC GASTRIC SLEEVE RESECTION N/A 03/03/2019   Procedure: LAPAROSCOPIC GASTRIC SLEEVE RESECTION, Upper Endo, ERAS Pathway;  Surgeon: Greer Pickerel, MD;  Location: [facility];  Service: General;  Laterality: N/A;  . LEFT HEART  CATHETERIZATION WITH CORONARY ANGIOGRAM N/A 02/10/2013   Procedure: LEFT HEART CATHETERIZATION WITH CORONARY ANGIOGRAM;  Surgeon: Burnell Blanks, MD;  Location: [facility] CATH LAB;  Service: Cardiovascular;  Laterality: N/A;  . LUMBAR LAMINECTOMY N/A 07/16/2018   Procedure: LEFT L4-5 REDO PARTIAL LUMBAR HEMILAMINECTOMY WITH FORAMINOTOMY LEFT L4;  Surgeon: Jessy Oto, MD;  Location: [facility];  Service: Orthopedics;  Laterality: N/A;  . LUMBAR LAMINECTOMY/DECOMPRESSION MICRODISCECTOMY N/A 01/27/2014   Procedure: CENTRAL LUMBAR LAMINECTOMY L4-5 AND L3-4;  Surgeon: Jessy Oto, MD;  Location: [facility];  Service: Orthopedics;  Laterality: N/A;  . ORIF ANKLE FRACTURE Right 09/02/2015   Procedure: OPEN REDUCTION INTERNAL FIXATION (ORIF) ANKLE FRACTURE;  Surgeon: Newt Minion, MD;  Location: [facility];  Service: Orthopedics;  Laterality: Right;  . PERIPHERALLY INSERTED CENTRAL CATHETER INSERTION  09/02/2015  . POLYPECTOMY    . SHOULDER ARTHROSCOPY W/ ROTATOR CUFF REPAIR Bilateral    "3 on the right; 1 on the left"  . SKIN SPLIT GRAFT Right 10/01/2015   Procedure: RIGHT ANKLE APPLY SKIN GRAFT SPLIT THICKNESS;  Surgeon: Newt Minion, MD;  Location: [facility];  Service: Orthopedics;  Laterality: Right;  . TONSILLECTOMY    . TOOTH EXTRACTION    . TOTAL HIP ARTHROPLASTY Right 04/17/2018  . TOTAL HIP ARTHROPLASTY Right 04/17/2018   Procedure: RIGHT TOTAL HIP ARTHROPLASTY;  Surgeon: Newt Minion, MD;  Location: [facility];  Service: Orthopedics;  Laterality: Right;  . TOTAL KNEE ARTHROPLASTY Bilateral 2008  . UMBILICAL HERNIA REPAIR     UHR  . UPPER GASTROINTESTINAL ENDOSCOPY    . URETHRAL DILATION  X 4  . VASECTOMY    . WISDOM TOOTH EXTRACTION     Social History   Occupational History  . Occupation: disabled since 2006 due to ortho. heart, psych    Employer: UNEMPLOYED  . Occupation: part time work as an Multimedia programmer, wrestling, and Holiday representative  Tobacco Use  . Smoking status: Former  Smoker    Types: Cigars    Quit date: 08/28/2010    Years since quitting: 8.5  . Smokeless tobacco: Never Used  . Tobacco comment: 04/18/2016 "smoked 1 cigar/wk when I did smoke"  Substance and Sexual Activity  . Alcohol use: Not Currently    Alcohol/week: 0.0 standard drinks    Comment: rarely  . Drug use: No  . Sexual activity: Not Currently

## 2019-03-25 ENCOUNTER — Telehealth: Payer: Self-pay | Admitting: Orthopedic Surgery

## 2019-03-25 ENCOUNTER — Ambulatory Visit (INDEPENDENT_AMBULATORY_CARE_PROVIDER_SITE_OTHER): Payer: Medicare HMO | Admitting: Gastroenterology

## 2019-03-25 ENCOUNTER — Ambulatory Visit (INDEPENDENT_AMBULATORY_CARE_PROVIDER_SITE_OTHER): Payer: Medicare HMO | Admitting: Internal Medicine

## 2019-03-25 ENCOUNTER — Encounter: Payer: Self-pay | Admitting: Internal Medicine

## 2019-03-25 VITALS — BP 123/68 | HR 77 | Temp 96.0°F | Ht 72.0 in | Wt 315.0 lb

## 2019-03-25 DIAGNOSIS — Z86010 Personal history of colonic polyps: Secondary | ICD-10-CM | POA: Diagnosis not present

## 2019-03-25 DIAGNOSIS — Z7901 Long term (current) use of anticoagulants: Secondary | ICD-10-CM | POA: Diagnosis not present

## 2019-03-25 DIAGNOSIS — K219 Gastro-esophageal reflux disease without esophagitis: Secondary | ICD-10-CM

## 2019-03-25 DIAGNOSIS — R6889 Other general symptoms and signs: Secondary | ICD-10-CM | POA: Diagnosis not present

## 2019-03-25 DIAGNOSIS — R739 Hyperglycemia, unspecified: Secondary | ICD-10-CM

## 2019-03-25 DIAGNOSIS — I10 Essential (primary) hypertension: Secondary | ICD-10-CM

## 2019-03-25 DIAGNOSIS — K8689 Other specified diseases of pancreas: Secondary | ICD-10-CM

## 2019-03-25 MED ORDER — ZENPEP 15000-47000 UNITS PO CPEP: 80000.0000 [IU] | ORAL_CAPSULE | Freq: Three times a day (TID) | ORAL | 3 refills | Status: DC

## 2019-03-25 MED ORDER — PRASUGREL HCL 10 MG PO TABS: 10.0000 mg | ORAL_TABLET | Freq: Every day | ORAL | 11 refills | Status: DC

## 2019-03-25 NOTE — Assessment & Plan Note (Signed)
Ok to change from lovenox to effient 10 qd

## 2019-03-25 NOTE — Assessment & Plan Note (Signed)
stable overall by history and exam, recent data reviewed with pt, and pt to continue medical treatment as before,  to f/u any worsening symptoms or concerns  

## 2019-03-25 NOTE — Progress Notes (Signed)
HPI :  68 year old male here for follow-up visit.  I have previously seen him for loose stools with urgency, as well as GERD.  I last saw him on November 19, 2018 for colonoscopy.  Results as below Colonoscopy 11/19/18 - The perianal and digital rectal examinations were normal. - The terminal ileum appeared normal. - A 3 mm polyp was found in the cecum. The polyp was sessile. The polyp was removed with a cold snare. Resection and retrieval were complete. - Two sessile polyps were found in the ascending colon. The polyps were 3 mm in size. These polyps were removed with a cold snare. Resection and retrieval were complete. - Two sessile polyps were found in the transverse colon. The polyps were 3 mm in size. These polyps were removed with a cold snare. Resection and retrieval were complete. - A 3 mm polyp was found in the sigmoid colon. The polyp was sessile. The polyp was removed with a cold snare. Resection and retrieval were complete. - There was a medium-sized lipoma, in the ascending colon. - Internal hemorrhoids were found during retroflexion. - The exam was otherwise without abnormality.  Random biopsies were normal Multiple small adenomas, repeat colon in 3 years  Given his persistent symptoms he had a negative GI pathogen panel, and then he had a pancreatic fecal elastase which was undetectable.  He then had a CT abdomen / pelvis 02/11/19 - fatty pancreatic atrophy, no mass lesions  He was placed on Zenpep 40,000 unit tab, 2 tabs with each meal, 1 with snacks.  He reports this quickly resolved his symptoms and essentially stopped his diarrhea.  He has been tolerating it very well and in fact is now having constipation.  Previously was having multiple loose stools per day, now he is having 2 bowel movements per week.  He states he is doing much better, no urgency of his stools.  He denies ever seeing fat in stool, however he also acknowledges he never really looked.  No blood in his  stools.  He has a history of morbid obesity and had a sleeve gastrectomy performed a few weeks ago with Dr. Redmond Pulling.  He is lost 15 pounds since that happened.  He denies any abdominal pains.  He is taking Protonix 40 mg every day and that controls his reflux symptoms.  He is eating pretty well.  He has no history of Barrett's esophagus that is known.  He does have worsening left hip pain and states he is going to have a left hip replacement performed in the upcoming months.  He denies any family history of pancreatic cancer.  No tobacco use.  He had heavy alcohol use in the past however not recently.  He denies ever previously having pancreatitis.  He otherwise feels pretty well at this time without complaints.  Prior endoscopic workup: Colonoscopy 10/22/2014 - 2 polyps removed, largest 1cm in size - adenomas - Dr. Olevia Perches EGD 10/19/2014 - erosive gastritis. H pylori testing negative   Colonoscopy 11/19/18 - The perianal and digital rectal examinations were normal. - The terminal ileum appeared normal. - A 3 mm polyp was found in the cecum. The polyp was sessile. The polyp was removed with a cold snare. Resection and retrieval were complete. - Two sessile polyps were found in the ascending colon. The polyps were 3 mm in size. These polyps were removed with a cold snare. Resection and retrieval were complete. - Two sessile polyps were found in the transverse colon. The polyps were  3 mm in size. These polyps were removed with a cold snare. Resection and retrieval were complete. - A 3 mm polyp was found in the sigmoid colon. The polyp was sessile. The polyp was removed with a cold snare. Resection and retrieval were complete. - There was a medium-sized lipoma, in the ascending colon. - Internal hemorrhoids were found during retroflexion. - The exam was otherwise without abnormality.  Random biopsies were normal Multiple small adenomas, repeat colon in 3 years    Past Medical History:    Diagnosis Date   ALLERGIC RHINITIS 06/24/2009   Allergy    Anxiety 11/12/2011   Adequate for discharge    Arthritis    "all my joints" (09/30/2013)   Arthritis of foot, right, degenerative 04/15/2014   Balance disorder 03/12/2013   Benign neoplasm of cecum    Benign neoplasm of descending colon    CAD (coronary artery disease) 06/24/2009   5 stents placed in 2007     Chronic anticoagulation    Chronic pain syndrome 10/27/2009   of ankle, shoulders, low back.  sciatica.    Closed fracture of right foot 10/17/2014   CORONARY ARTERY DISEASE 06/24/2009   a. s/p multiple PCIs - In 2008 he had a Taxus DES to the mild LAD, Endeavor DES to mid LCX and distal LCX. In January 2009 he had DES to distal LCX, mid LCX and proximal LCX. In November 2009 had BMS x 2 to the mid RCA. Cath 10/2011 with patent stents, noncardiac CP. LHC 01/2013: patent stents (noncardiac CP).   DEGENERATIVE JOINT DISEASE 06/24/2009   Qualifier: Diagnosis of  By: [name], [name]    Depression    Depression with anxiety    Prior suicide attempt   [place], LUMBAR 04/19/2010   ERECTILE DYSFUNCTION, ORGANIC 05/30/2010   Essential hypertension 06/24/2009   Qualifier: Diagnosis of  By: [name], [name]    Fibromyalgia    Fracture dislocation of ankle joint 09/02/2015   Gait disorder 03/12/2013   General weakness 07/14/2014   GERD (gastroesophageal reflux disease) 09/08/2015   Hand joint pain 06/10/2013   Heart murmur    Hepatitis C    treated pt. unknown with what he was a teenager   History of kidney stones    Hyperlipidemia 07/15/2009   Qualifier: Diagnosis of  By: Aundra Dubin, MD, [place]     HYPERLIPIDEMIA-MIXED 07/15/2009   HYPERTENSION 06/24/2009   Insomnia 10/04/2011   Irregular heart beat    Left hip pain 03/12/2013   Injected under ultrasound guidance on June 24, 2013    Major depression 09/13/2015   Myocardial infarction [facility]) 2008   Non-cardiac chest pain 10/2011, 01/2013   Obesity     Occult blood positive stool 10/17/2014   Open ankle fracture 09/02/2015   OSA (obstructive sleep apnea)    not using CPAP (09/30/2013)   Pain of right thumb 04/03/2013   Pneumonia    PPD positive 04/08/2015   Pre-ulcerative corn or callous 02/06/2013   Rotator cuff tear arthropathy of both shoulders 06/10/2013   History of bilateral shoulder cuff surgery for rotator cuff tears. Reports increase in pain 09/11/2015 during physical therapy of the left shoulder.    SCIATICA, LEFT 04/19/2010   Qualifier: Diagnosis of  By: [name], [name]    Sleep apnea    wears cpap    Spinal stenosis in cervical region 09/26/2013   Spinal stenosis, lumbar region, with neurogenic claudication 09/26/2013   Type II diabetes mellitus ([place])  2012   no meds in 09/2014.    Uncontrolled type 2 DM with peripheral circulatory disorder ([place]) 10/04/2013   URETHRAL STRICTURE 06/24/2009   self catheterizes.      Past Surgical History:  Procedure Laterality Date   AMPUTATION Right 06/14/2016   Procedure: AMPUTATION BELOW KNEE;  Surgeon: Newt Minion, MD;  Location: [place];  Service: Orthopedics;  Laterality: Right;   ANKLE FUSION Right 04/15/2014   Procedure: Right Subtalar, Talonavicular Fusion;  Surgeon: Newt Minion, MD;  Location: [place];  Service: Orthopedics;  Laterality: Right;   ANKLE FUSION Right 04/18/2016   Procedure: Right Ankle Tibiocalcaneal Fusion;  Surgeon: Newt Minion, MD;  Location: [place];  Service: Orthopedics;  Laterality: Right;   ANTERIOR CERVICAL DECOMP/DISCECTOMY FUSION N/A 09/26/2013   Procedure: ANTERIOR CERVICAL DISCECTOMY FUSION C3-4, plate and screw fixation, allograft bone graft;  Surgeon: Jessy Oto, MD;  Location: [place];  Service: Orthopedics;  Laterality: N/A;   BACK SURGERY     BELOW KNEE LEG AMPUTATION Right 06/14/2016   right ankle and foot   CARDIAC CATHETERIZATION  X 1   CARPAL TUNNEL RELEASE Bilateral    COLONOSCOPY     COLONOSCOPY N/A 10/22/2014   Procedure:  COLONOSCOPY;  Surgeon: Lafayette Dragon, MD;  Location: [facility] ENDOSCOPY;  Service: Endoscopy;  Laterality: N/A;   CORONARY ANGIOPLASTY WITH STENT PLACEMENT     "I have 9 stents"   ESOPHAGOGASTRODUODENOSCOPY N/A 10/19/2014   Procedure: ESOPHAGOGASTRODUODENOSCOPY (EGD);  Surgeon: Jerene Bears, MD;  Location: [facility] ENDOSCOPY;  Service: Endoscopy;  Laterality: N/A;   FRACTURE SURGERY     FUSION OF TALONAVICULAR JOINT Right 04/15/2014   dr duda   HERNIA REPAIR     umbilical   INGUINAL HERNIA REPAIR Right 05/11/2015   Procedure: LAPAROSCOPIC REPAIR RIGHT  INGUINAL HERNIA;  Surgeon: Greer Pickerel, MD;  Location: [place];  Service: General;  Laterality: Right;   INSERTION OF MESH Right 05/11/2015   Procedure: INSERTION OF MESH;  Surgeon: Greer Pickerel, MD;  Location: [place];  Service: General;  Laterality: Right;   JOINT REPLACEMENT     KNEE CARTILAGE SURGERY Right X 12   "~ 1/2 open; ~ 1/2 scopes"   KNEE CARTILAGE SURGERY Left X 3   "3 scopes"   LAPAROSCOPIC GASTRIC SLEEVE RESECTION N/A 03/03/2019   Procedure: LAPAROSCOPIC GASTRIC SLEEVE RESECTION, Upper Endo, ERAS Pathway;  Surgeon: Greer Pickerel, MD;  Location: [facility];  Service: General;  Laterality: N/A;   LEFT HEART CATHETERIZATION WITH CORONARY ANGIOGRAM N/A 02/10/2013   Procedure: LEFT HEART CATHETERIZATION WITH CORONARY ANGIOGRAM;  Surgeon: Burnell Blanks, MD;  Location: [facility] CATH LAB;  Service: Cardiovascular;  Laterality: N/A;   LUMBAR LAMINECTOMY N/A 07/16/2018   Procedure: LEFT L4-5 REDO PARTIAL LUMBAR HEMILAMINECTOMY WITH FORAMINOTOMY LEFT L4;  Surgeon: Jessy Oto, MD;  Location: [place];  Service: Orthopedics;  Laterality: N/A;   LUMBAR LAMINECTOMY/DECOMPRESSION MICRODISCECTOMY N/A 01/27/2014   Procedure: CENTRAL LUMBAR LAMINECTOMY L4-5 AND L3-4;  Surgeon: Jessy Oto, MD;  Location: [place];  Service: Orthopedics;  Laterality: N/A;   ORIF ANKLE FRACTURE Right 09/02/2015   Procedure: OPEN REDUCTION INTERNAL FIXATION (ORIF) ANKLE  FRACTURE;  Surgeon: Newt Minion, MD;  Location: [place];  Service: Orthopedics;  Laterality: Right;   PERIPHERALLY INSERTED CENTRAL CATHETER INSERTION  09/02/2015   POLYPECTOMY     SHOULDER ARTHROSCOPY W/ ROTATOR CUFF REPAIR Bilateral    "3 on the right; 1 on the left"   SKIN SPLIT  GRAFT Right 10/01/2015   Procedure: RIGHT ANKLE APPLY SKIN GRAFT SPLIT THICKNESS;  Surgeon: Newt Minion, MD;  Location: [place];  Service: Orthopedics;  Laterality: Right;   TONSILLECTOMY     TOOTH EXTRACTION     TOTAL HIP ARTHROPLASTY Right 04/17/2018   TOTAL HIP ARTHROPLASTY Right 04/17/2018   Procedure: RIGHT TOTAL HIP ARTHROPLASTY;  Surgeon: Newt Minion, MD;  Location: [place];  Service: Orthopedics;  Laterality: Right;   TOTAL KNEE ARTHROPLASTY Bilateral 1610   UMBILICAL HERNIA REPAIR     UHR   UPPER GASTROINTESTINAL ENDOSCOPY     URETHRAL DILATION  X 4   VASECTOMY     WISDOM TOOTH EXTRACTION     Family History  Problem Relation Age of Onset   Depression Mother    Heart disease Mother    Hypertension Mother    Breast cancer Mother    Cancer Mother    Diabetes Father    Heart disease Father        CABG   Hypertension Father    Hyperlipidemia Father    Prostate cancer Father    Skin cancer Father    Depression Brother        x 2   Hypertension Brother        x2   Healthy Son    Heart disease Maternal Grandfather    Early death Maternal Grandfather    Heart attack Maternal Grandfather 13   Early death Paternal Grandfather    Coronary artery disease Other    Hypertension Other    Depression Other    Healthy Son    Colon cancer Neg Hx    Colon polyps Neg Hx    Esophageal cancer Neg Hx    Rectal cancer Neg Hx    Stomach cancer Neg Hx    Social History   Tobacco Use   Smoking status: Former Smoker    Types: Cigars    Quit date: 08/28/2010    Years since quitting: 8.5   Smokeless tobacco: Never Used   Tobacco comment: 04/18/2016 "smoked 1  cigar/wk when I did smoke"  Substance Use Topics   Alcohol use: Not Currently    Alcohol/week: 0.0 standard drinks    Comment: rarely   Drug use: No   Current Outpatient Medications  Medication Sig Dispense Refill   ARIPiprazole (ABILIFY) 5 MG tablet Take 1 tablet (5 mg total) by mouth at bedtime. 90 tablet 0   atorvastatin (LIPITOR) 20 MG tablet Take 1 tablet (20 mg total) by mouth daily. 90 tablet 1   benztropine (COGENTIN) 0.5 MG tablet Take 1 tablet (0.5 mg total) by mouth at bedtime. 90 tablet 0   clonazePAM (KLONOPIN) 0.5 MG tablet Take 1 tablet (0.5 mg total) by mouth at bedtime. 30 tablet 2   diclofenac sodium (VOLTAREN) 1 % GEL Apply 4 g topically 3 (three) times daily as needed (pain). 350 g 5   DULoxetine (CYMBALTA) 30 MG capsule Take 1 capsule (30 mg total) by mouth 2 (two) times daily. 180 capsule 0   gabapentin (NEURONTIN) 800 MG tablet Take 800 mg by mouth 3 (three) times daily.      hydrochlorothiazide (HYDRODIURIL) 25 MG tablet Take 1 tablet (25 mg total) by mouth daily. 90 tablet 1   Melatonin 5 MG TABS Take 20 mg by mouth at bedtime.      methocarbamol (ROBAXIN) 500 MG tablet Take 1 tablet (500 mg total) by mouth every 8 (eight) hours as needed for muscle  spasms. 90 tablet 2   MYRBETRIQ 50 MG TB24 tablet Take 50 mg by mouth at bedtime.      Omega-3 Fatty Acids (FISH OIL) 1000 MG CAPS Take 1,000 mg by mouth at bedtime.      ondansetron (ZOFRAN-ODT) 4 MG disintegrating tablet Take 1 tablet (4 mg total) by mouth every 6 (six) hours as needed for nausea or vomiting. 20 tablet 0   oxymetazoline (AFRIN) 0.05 % nasal spray Place 1 spray into both nostrils 2 (two) times daily as needed for congestion.     Pancrelipase, Lip-Prot-Amyl, (ZENPEP) 15000-47000 units CPEP Take 40,000 Units by mouth 3 (three) times daily with meals. Take 2 capsules with meals and 1 capsule with snacks (Patient taking differently: Take 1-2 capsules by mouth See admin instructions. Take 2  capsules with meals and 1 capsule with snacks; 8 caps total daily) 120 capsule 3   pantoprazole (PROTONIX) 40 MG tablet TAKE ONE TABLET BY MOUTH EVERY DAY 90 tablet 1   sildenafil (VIAGRA) 100 MG tablet Take 100 mg by mouth daily.     traZODone (DESYREL) 150 MG tablet Take 1 tablet (150 mg total) by mouth at bedtime. 90 tablet 0   enoxaparin (LOVENOX) 40 MG/0.4ML injection Inject 0.4 mLs (40 mg total) into the skin 2 (two) times daily for 14 days. 11.2 mL 0   No current facility-administered medications for this visit.    No Known Allergies   Review of Systems: All systems reviewed and negative except where noted in HPI.    Xr Hip Unilat W Or W/o Pelvis 2-3 Views Left  Result Date: 03/24/2019 2 view radiographs of the left hip shows arthritis with some calcification of the gluteus medius.  Patient has bone-on-bone contact with periarticular bony spurs.  Radiographs of the right hip shows a stable total hip arthroplasty no complicating features.  The leg length of the left leg is short.  Lab Results  Component Value Date   WBC 6.9 03/05/2019   HGB 11.7 (L) 03/05/2019   HCT 36.9 (L) 03/05/2019   MCV 87.9 03/05/2019   PLT 227 03/05/2019    Lab Results  Component Value Date   CREATININE 0.96 03/04/2019   BUN 18 03/04/2019   NA 136 03/04/2019   K 4.4 03/04/2019   CL 102 03/04/2019   CO2 26 03/04/2019    Lab Results  Component Value Date   ALT 19 03/04/2019   AST 28 03/04/2019   ALKPHOS 58 03/04/2019   BILITOT 0.9 03/04/2019      Physical Exam: BP 123/68    Pulse 77    Temp (!) 96 F (35.6 C)    Ht 6' (1.829 m)    Wt (!) 315 lb (142.9 kg)    SpO2 98%    BMI 42.72 kg/m  Constitutional: Pleasant, male in no acute distress. HEENT: Normocephalic and atraumatic. Conjunctivae are normal. No scleral icterus. Neck supple.  Cardiovascular: Normal rate, regular rhythm.  Pulmonary/chest: Effort normal and breath sounds normal. No wheezing, rales or rhonchi. Abdominal:  Soft, protuberant, nontender. There are no masses palpable. No hepatomegaly. Extremities: no edema, RLE amputee Lymphadenopathy: No cervical adenopathy noted. Neurological: Alert and oriented to person place and time. Skin: Skin is warm and dry. No rashes noted. Psychiatric: Normal mood and affect. Behavior is normal.   ASSESSMENT AND PLAN: 68 year old male here for reassessment the following:  Pancreatic exocrine dysfunction - undetectable fecal elastase done to evaluate chronic diarrhea following colonoscopy which did not show any clear cause.  CT scan shows an atrophic pancreas without any mass lesions.  He has responded quite well to pancreatic enzyme supplementation and this has resolved his symptoms.  Now actually having some constipation.  We discussed pancreatic exocrine dysfunction, unclear why he has this.  No history of pancreatitis, no tobacco use, does endorse prior alcohol use.  I do not see any mass lesions on the scan.  We will continue supportive care with pancreatic enzyme supplementation, he can try reducing the dose to 1 capsule with meals if the current dose is a bit too strong for him. He can follow up in one year for this issue  History of colon polyps - multiple adenomas removed on colonoscopy.  He has a history of advanced adenomas as well.  Recommend repeat colonoscopy in 3 years for surveillance.  He agreed  GERD - well controlled with Protonix.  He recently underwent sleeve gastrectomy, hopefully with weight loss this reduces his reflux burden and may be able to reduce the dose of Protonix over time.  He can follow-up as needed for this issue.  No history of Barrett's esophagus on prior EGD.  Boaz Cellar, MD [facility]astro[facility]

## 2019-03-25 NOTE — Telephone Encounter (Signed)
Called left voicemail message needing Rx faxed over to [facility] for a new sleeve that goes over his leg with the pin in the bottom. The number to contact patient is [phone]

## 2019-03-25 NOTE — Progress Notes (Signed)
Subjective:    Patient ID: Travis [name]., male    DOB: 06/01/1950, 68 y.o.   MRN: [id number]  HPI  Here to f/u after hospn oct 12- 14 for gastric sleeve procedure; On postoperative day 1 the patient had no fever or tachycardia and was tolerating water in their diet was gradually advanced throughout the day. Because of his amputation, a PT consult was obtained and rec home health PT.  On POD 2  Their vital signs are stable without fever or tachycardia. Their hemoglobin had remained stable. The patient was maintained on their home settings for CPAP therapy.  He was felt to be increased risk for blood clots after discharge based on risk calculator and was sent out on 2 weeks of bid lovenox prophylaxis dose.  He was to resume his oral blood thinner after finishing his lovenox injections. He was felt to be increased risk for blood clots after discharge based on risk calculator and was sent out on 2 weeks of bid lovenox prophylaxis dose.  He will resume his oral blood thinner after finishing his lovenox injections. Since oct 14 at least has lost 15 lbs per pt, 10 by our record. Wt Readings from Last 3 Encounters:  03/25/19 (!) 315 lb (142.9 kg)  03/25/19 (!) 315 lb (142.9 kg)  03/24/19 (!) 325 lb (147.4 kg)  Saw Dr Sharol Given yesterday, now with plan for left hip THR likely this month. Past Medical History:  Diagnosis Date  . ALLERGIC RHINITIS 06/24/2009  . Allergy   . Anxiety 11/12/2011   Adequate for discharge   . Arthritis    "all my joints" (09/30/2013)  . Arthritis of foot, right, degenerative 04/15/2014  . Balance disorder 03/12/2013  . Benign neoplasm of cecum   . Benign neoplasm of descending colon   . CAD (coronary artery disease) 06/24/2009   5 stents placed in 2007    . Chronic anticoagulation   . Chronic pain syndrome 10/27/2009   of ankle, shoulders, low back.  sciatica.   . Closed fracture of right foot 10/17/2014  . CORONARY ARTERY DISEASE 06/24/2009   a. s/p multiple PCIs - In 2008  he had a Taxus DES to the mild LAD, Endeavor DES to mid LCX and distal LCX. In January 2009 he had DES to distal LCX, mid LCX and proximal LCX. In November 2009 had BMS x 2 to the mid RCA. Cath 10/2011 with patent stents, noncardiac CP. LHC 01/2013: patent stents (noncardiac CP).  . DEGENERATIVE JOINT DISEASE 06/24/2009   Qualifier: Diagnosis of  By: [name], [name]   . Depression   . Depression with anxiety    Prior suicide attempt  . [place] DISEASE, LUMBAR 04/19/2010  . ERECTILE DYSFUNCTION, ORGANIC 05/30/2010  . Essential hypertension 06/24/2009   Qualifier: Diagnosis of  By: [name], [name]   . Fibromyalgia   . Fracture dislocation of ankle joint 09/02/2015  . Gait disorder 03/12/2013  . General weakness 07/14/2014  . GERD (gastroesophageal reflux disease) 09/08/2015  . Hand joint pain 06/10/2013  . Heart murmur   . Hepatitis C    treated pt. unknown with what he was a teenager  . History of kidney stones   . Hyperlipidemia 07/15/2009   Qualifier: Diagnosis of  By: Aundra Dubin, MD, [place]    . HYPERLIPIDEMIA-MIXED 07/15/2009  . HYPERTENSION 06/24/2009  . Insomnia 10/04/2011  . Irregular heart beat   . Left hip pain 03/12/2013   Injected under ultrasound guidance on June 24, 2013   . Major depression 09/13/2015  . Myocardial infarction ([place]) 2008  . Non-cardiac chest pain 10/2011, 01/2013  . Obesity   . Occult blood positive stool 10/17/2014  . Open ankle fracture 09/02/2015  . OSA (obstructive sleep apnea)    not using CPAP (09/30/2013)  . Pain of right thumb 04/03/2013  . Pneumonia   . PPD positive 04/08/2015  . Pre-ulcerative corn or callous 02/06/2013  . Rotator cuff tear arthropathy of both shoulders 06/10/2013   History of bilateral shoulder cuff surgery for rotator cuff tears. Reports increase in pain 09/11/2015 during physical therapy of the left shoulder.   [name] SCIATICA, LEFT 04/19/2010   Qualifier: Diagnosis of  By: [name], [name]   . Sleep apnea    wears cpap   . Spinal stenosis in  cervical region 09/26/2013  . Spinal stenosis, lumbar region, with neurogenic claudication 09/26/2013  . Type II diabetes mellitus ([place]) 2012   no meds in 09/2014.   [name] Uncontrolled type 2 DM with peripheral circulatory disorder ([place]) 10/04/2013  . URETHRAL STRICTURE 06/24/2009   self catheterizes.    Past Surgical History:  Procedure Laterality Date  . AMPUTATION Right 06/14/2016   Procedure: AMPUTATION BELOW KNEE;  Surgeon: Newt Minion, MD;  Location: [place];  Service: Orthopedics;  Laterality: Right;  . ANKLE FUSION Right 04/15/2014   Procedure: Right Subtalar, Talonavicular Fusion;  Surgeon: Newt Minion, MD;  Location: [place];  Service: Orthopedics;  Laterality: Right;  . ANKLE FUSION Right 04/18/2016   Procedure: Right Ankle Tibiocalcaneal Fusion;  Surgeon: Newt Minion, MD;  Location: [place];  Service: Orthopedics;  Laterality: Right;  . ANTERIOR CERVICAL DECOMP/DISCECTOMY FUSION N/A 09/26/2013   Procedure: ANTERIOR CERVICAL DISCECTOMY FUSION C3-4, plate and screw fixation, allograft bone graft;  Surgeon: Jessy Oto, MD;  Location: [place];  Service: Orthopedics;  Laterality: N/A;  . BACK SURGERY    . BELOW KNEE LEG AMPUTATION Right 06/14/2016   right ankle and foot  . CARDIAC CATHETERIZATION  X 1  . CARPAL TUNNEL RELEASE Bilateral   . COLONOSCOPY    . COLONOSCOPY N/A 10/22/2014   Procedure: COLONOSCOPY;  Surgeon: Lafayette Dragon, MD;  Location[facility] ENDOSCOPY;  Service: Endoscopy;  Laterality: N/A;  . CORONARY ANGIOPLASTY WITH STENT PLACEMENT     "I have 9 stents"  . ESOPHAGOGASTRODUODENOSCOPY N/A 10/19/2014   Procedure: ESOPHAGOGASTRODUODENOSCOPY (EGD);  Surgeon: Jerene Bears, MD;  Location[facility] ENDOSCOPY;  Service: Endoscopy;  Laterality: N/A;  . FRACTURE SURGERY    . FUSION OF TALONAVICULAR JOINT Right 04/15/2014   dr duda  . HERNIA REPAIR     umbilical  . INGUINAL HERNIA REPAIR Right 05/11/2015   Procedure: LAPAROSCOPIC REPAIR RIGHT  INGUINAL HERNIA;  Surgeon: Greer Pickerel, MD;   Location: [place];  Service: General;  Laterality: Right;  . INSERTION OF MESH Right 05/11/2015   Procedure: INSERTION OF MESH;  Surgeon: Greer Pickerel, MD;  Location: [place];  Service: General;  Laterality: Right;  . JOINT REPLACEMENT    . KNEE CARTILAGE SURGERY Right X 12   "~ 1/2 open; ~ 1/2 scopes"  . KNEE CARTILAGE SURGERY Left X 3   "3 scopes"  . LAPAROSCOPIC GASTRIC SLEEVE RESECTION N/A 03/03/2019   Procedure: LAPAROSCOPIC GASTRIC SLEEVE RESECTION, Upper Endo, ERAS Pathway;  Surgeon: Greer Pickerel, MD;  Location: [facility];  Service: General;  Laterality: N/A;  . LEFT HEART CATHETERIZATION WITH CORONARY ANGIOGRAM N/A 02/10/2013   Procedure: LEFT HEART CATHETERIZATION  WITH CORONARY ANGIOGRAM;  Surgeon: Burnell Blanks, MD;  Location: [facility] CATH LAB;  Service: Cardiovascular;  Laterality: N/A;  . LUMBAR LAMINECTOMY N/A 07/16/2018   Procedure: LEFT L4-5 REDO PARTIAL LUMBAR HEMILAMINECTOMY WITH FORAMINOTOMY LEFT L4;  Surgeon: Jessy Oto, MD;  Location: [place];  Service: Orthopedics;  Laterality: N/A;  . LUMBAR LAMINECTOMY/DECOMPRESSION MICRODISCECTOMY N/A 01/27/2014   Procedure: CENTRAL LUMBAR LAMINECTOMY L4-5 AND L3-4;  Surgeon: Jessy Oto, MD;  Location: [place];  Service: Orthopedics;  Laterality: N/A;  . ORIF ANKLE FRACTURE Right 09/02/2015   Procedure: OPEN REDUCTION INTERNAL FIXATION (ORIF) ANKLE FRACTURE;  Surgeon: Newt Minion, MD;  Location: [place];  Service: Orthopedics;  Laterality: Right;  . PERIPHERALLY INSERTED CENTRAL CATHETER INSERTION  09/02/2015  . POLYPECTOMY    . SHOULDER ARTHROSCOPY W/ ROTATOR CUFF REPAIR Bilateral    "3 on the right; 1 on the left"  . SKIN SPLIT GRAFT Right 10/01/2015   Procedure: RIGHT ANKLE APPLY SKIN GRAFT SPLIT THICKNESS;  Surgeon: Newt Minion, MD;  Location: [place];  Service: Orthopedics;  Laterality: Right;  . TONSILLECTOMY    . TOOTH EXTRACTION    . TOTAL HIP ARTHROPLASTY Right 04/17/2018  . TOTAL HIP ARTHROPLASTY Right 04/17/2018   Procedure:  RIGHT TOTAL HIP ARTHROPLASTY;  Surgeon: Newt Minion, MD;  Location: [place];  Service: Orthopedics;  Laterality: Right;  . TOTAL KNEE ARTHROPLASTY Bilateral 2008  . UMBILICAL HERNIA REPAIR     UHR  . UPPER GASTROINTESTINAL ENDOSCOPY    . URETHRAL DILATION  X 4  . VASECTOMY    . WISDOM TOOTH EXTRACTION      reports that he quit smoking about 8 years ago. His smoking use included cigars. He has never used smokeless tobacco. He reports previous alcohol use. He reports that he does not use drugs. family history includes Breast cancer in his mother; Cancer in his mother; Coronary artery disease in an other family member; Depression in his brother, mother, and another family member; Diabetes in his father; Early death in his maternal grandfather and paternal grandfather; Healthy in his son and son; Heart attack (age of onset: 5) in his maternal grandfather; Heart disease in his father, maternal grandfather, and mother; Hyperlipidemia in his father; Hypertension in his brother, father, mother, and another family member; Prostate cancer in his father; Skin cancer in his father. No Known Allergies Current Outpatient Medications on File Prior to Visit  Medication Sig Dispense Refill  . ARIPiprazole (ABILIFY) 5 MG tablet Take 1 tablet (5 mg total) by mouth at bedtime. 90 tablet 0  . atorvastatin (LIPITOR) 20 MG tablet Take 1 tablet (20 mg total) by mouth daily. 90 tablet 1  . benztropine (COGENTIN) 0.5 MG tablet Take 1 tablet (0.5 mg total) by mouth at bedtime. 90 tablet 0  . clonazePAM (KLONOPIN) 0.5 MG tablet Take 1 tablet (0.5 mg total) by mouth at bedtime. 30 tablet 2  . diclofenac sodium (VOLTAREN) 1 % GEL Apply 4 g topically 3 (three) times daily as needed (pain). 350 g 5  . DULoxetine (CYMBALTA) 30 MG capsule Take 1 capsule (30 mg total) by mouth 2 (two) times daily. 180 capsule 0  . gabapentin (NEURONTIN) 800 MG tablet Take 800 mg by mouth 3 (three) times daily.     . hydrochlorothiazide  (HYDRODIURIL) 25 MG tablet Take 1 tablet (25 mg total) by mouth daily. 90 tablet 1  . Melatonin 5 MG TABS Take 20 mg by mouth at bedtime.     [name]  methocarbamol (ROBAXIN) 500 MG tablet Take 1 tablet (500 mg total) by mouth every 8 (eight) hours as needed for muscle spasms. 90 tablet 2  . MYRBETRIQ 50 MG TB24 tablet Take 50 mg by mouth at bedtime.     . Omega-3 Fatty Acids (FISH OIL) 1000 MG CAPS Take 1,000 mg by mouth at bedtime.     . ondansetron (ZOFRAN-ODT) 4 MG disintegrating tablet Take 1 tablet (4 mg total) by mouth every 6 (six) hours as needed for nausea or vomiting. 20 tablet 0  . oxymetazoline (AFRIN) 0.05 % nasal spray Place 1 spray into both nostrils 2 (two) times daily as needed for congestion.    . Pancrelipase, Lip-Prot-Amyl, (ZENPEP) 15000-47000 units CPEP Take 80,000 Units by mouth 3 (three) times daily with meals. Take 2 capsules with meals and 1 capsule with snacks 720 capsule 3  . pantoprazole (PROTONIX) 40 MG tablet TAKE ONE TABLET BY MOUTH EVERY DAY 90 tablet 1  . sildenafil (VIAGRA) 100 MG tablet Take 100 mg by mouth daily.    . traZODone (DESYREL) 150 MG tablet Take 1 tablet (150 mg total) by mouth at bedtime. 90 tablet 0   No current facility-administered medications on file prior to visit.    Review of Systems  Constitutional: Negative for other unusual diaphoresis or sweats HENT: Negative for ear discharge or swelling Eyes: Negative for other worsening visual disturbances Respiratory: Negative for stridor or other swelling  Gastrointestinal: Negative for worsening distension or other blood Genitourinary: Negative for retention or other urinary change Musculoskeletal: Negative for other MSK pain or swelling Skin: Negative for color change or other new lesions Neurological: Negative for worsening tremors and other numbness  Psychiatric/Behavioral: Negative for worsening agitation or other fatigue All otherwise neg per pt     Objective:   Physical Exam BP 123/68    Pulse 77   Temp (!) 96 F (35.6 C) (Oral)   Ht 6' (1.829 m)   Wt (!) 315 lb (142.9 kg)   SpO2 98%   BMI 42.72 kg/m  VS noted,  Constitutional: Pt appears in NAD HENT: Head: NCAT.  Right Ear: External ear normal.  Left Ear: External ear normal.  Eyes: . Pupils are equal, round, and reactive to light. Conjunctivae and EOM are normal Nose: without d/c or deformity Neck: Neck supple. Gross normal ROM Cardiovascular: Normal rate and regular rhythm.   Pulmonary/Chest: Effort normal and breath sounds without rales or wheezing.  Abd:  Soft, NT, ND, + BS, no organomegaly Neurological: Pt is alert. At baseline orientation, motor grossly intact Skin: Skin is warm. No rashes, other new lesions, has trace LLE edema Psychiatric: Pt behavior is normal without agitation  All otherwise neg per pt  Lab Results  Component Value Date   WBC 6.9 03/05/2019   HGB 11.7 (L) 03/05/2019   HCT 36.9 (L) 03/05/2019   PLT 227 03/05/2019   GLUCOSE 115 (H) 03/04/2019   CHOL 124 06/03/2018   TRIG 119.0 06/03/2018   HDL 39.00 (L) 06/03/2018   LDLCALC 61 06/03/2018   ALT 19 03/04/2019   AST 28 03/04/2019   NA 136 03/04/2019   K 4.4 03/04/2019   CL 102 03/04/2019   CREATININE 0.96 03/04/2019   BUN 18 03/04/2019   CO2 26 03/04/2019   TSH 1.69 06/03/2018   PSA 0.73 06/03/2018   INR 1.2 07/15/2018   HGBA1C 5.8 (A) 01/24/2019   MICROALBUR 1.6 06/03/2018         Assessment & Plan:

## 2019-03-25 NOTE — Patient Instructions (Addendum)
Ok to stop the lovenox  OK to restart the effient 10 mg per day  Please continue all other medications as before, and refills have been done if requested.  Please have the pharmacy call with any other refills you may need.  Please continue your efforts at being more active, low cholesterol diet, and weight control.  Please keep your appointments with your specialists as you may have planned

## 2019-03-25 NOTE — Patient Instructions (Addendum)
If you are age 66 or older, your body mass index should be between 23-30. Your Body mass index is 42.72 kg/m. If this is out of the aforementioned range listed, please consider follow up with your Primary Care Provider.  If you are age 33 or younger, your body mass index should be between 19-25. Your Body mass index is 42.72 kg/m. If this is out of the aformentioned range listed, please consider follow up with your Primary Care Provider.   To help prevent the possible spread of infection to our patients, communities, and staff; we will be implementing the following measures:  As of now we are not allowing any visitors/family members to accompany you to any upcoming appointments with [facility]. If you have any concerns about this please contact our office to discuss prior to the appointment.   We have sent the following medications to your pharmacy for you to pick up at your convenience: ZenPep 40,000 USP: Take 2 capsules (80,000 USP) with meals and 1capsule with a snack  Thank you for entrusting me with your care and for choosing [employer], Dr. [place] Cellar

## 2019-03-26 DIAGNOSIS — G894 Chronic pain syndrome: Secondary | ICD-10-CM | POA: Diagnosis not present

## 2019-03-26 DIAGNOSIS — G546 Phantom limb syndrome with pain: Secondary | ICD-10-CM | POA: Diagnosis not present

## 2019-03-26 DIAGNOSIS — M961 Postlaminectomy syndrome, not elsewhere classified: Secondary | ICD-10-CM | POA: Diagnosis not present

## 2019-03-26 DIAGNOSIS — M47816 Spondylosis without myelopathy or radiculopathy, lumbar region: Secondary | ICD-10-CM | POA: Diagnosis not present

## 2019-03-27 DIAGNOSIS — M47816 Spondylosis without myelopathy or radiculopathy, lumbar region: Secondary | ICD-10-CM | POA: Diagnosis not present

## 2019-03-27 DIAGNOSIS — R6889 Other general symptoms and signs: Secondary | ICD-10-CM | POA: Diagnosis not present

## 2019-03-27 NOTE — Telephone Encounter (Signed)
Patient was called and notified that [facility] will be faxed today.

## 2019-04-01 DIAGNOSIS — G4733 Obstructive sleep apnea (adult) (pediatric): Secondary | ICD-10-CM | POA: Diagnosis not present

## 2019-04-03 DIAGNOSIS — R6889 Other general symptoms and signs: Secondary | ICD-10-CM | POA: Diagnosis not present

## 2019-04-07 DIAGNOSIS — R6889 Other general symptoms and signs: Secondary | ICD-10-CM | POA: Diagnosis not present

## 2019-04-10 DIAGNOSIS — M47816 Spondylosis without myelopathy or radiculopathy, lumbar region: Secondary | ICD-10-CM | POA: Diagnosis not present

## 2019-04-10 DIAGNOSIS — R6889 Other general symptoms and signs: Secondary | ICD-10-CM | POA: Diagnosis not present

## 2019-04-11 ENCOUNTER — Ambulatory Visit (INDEPENDENT_AMBULATORY_CARE_PROVIDER_SITE_OTHER): Payer: Medicare HMO | Admitting: Physical Medicine and Rehabilitation

## 2019-04-11 ENCOUNTER — Ambulatory Visit: Payer: Self-pay

## 2019-04-11 ENCOUNTER — Other Ambulatory Visit: Payer: Self-pay

## 2019-04-11 ENCOUNTER — Encounter: Payer: Self-pay | Admitting: Physical Medicine and Rehabilitation

## 2019-04-11 DIAGNOSIS — M25552 Pain in left hip: Secondary | ICD-10-CM

## 2019-04-11 DIAGNOSIS — R6889 Other general symptoms and signs: Secondary | ICD-10-CM | POA: Diagnosis not present

## 2019-04-11 NOTE — Progress Notes (Signed)
 .  Numeric Pain Rating Scale and Functional Assessment Average Pain 6   In the last MONTH (on 0-10 scale) has pain interfered with the following?  1. General activity like being  able to carry out your everyday physical activities such as walking, climbing stairs, carrying groceries, or moving a chair?  Rating(8)  -Dye Allergies.

## 2019-04-14 ENCOUNTER — Other Ambulatory Visit: Payer: Self-pay | Admitting: Internal Medicine

## 2019-04-14 DIAGNOSIS — M25552 Pain in left hip: Secondary | ICD-10-CM

## 2019-04-14 MED ORDER — BUPIVACAINE HCL 0.25 % IJ SOLN
4.0000 mL | INTRAMUSCULAR | Status: AC | PRN
  Administered 2019-04-14: 4 mL via INTRA_ARTICULAR

## 2019-04-14 MED ORDER — TRIAMCINOLONE ACETONIDE 40 MG/ML IJ SUSP
80.0000 mg | INTRAMUSCULAR | Status: AC | PRN
  Administered 2019-04-14: 80 mg via INTRA_ARTICULAR

## 2019-04-14 NOTE — Progress Notes (Signed)
Travis [name]. - 68 y.o. male MRN [id number]  Date of birth: 1951-03-19  Office Visit Note: Visit Date: 04/11/2019 PCP: [name], MD Referred by: [name], MD  Subjective: Chief Complaint  Patient presents with   Left Hip - Pain   HPI: Travis [name]. is a 68 y.o. male who comes in today For planned diagnostic and hopefully therapeutic anesthetic hip arthrogram on the left.  Patient reports 6 to 8 weeks of worsening left hip and groin pain.  Is worse with walking and laying down and turning.  He reports pain medication helps to a degree.  He has had prior right below the knee amputation.  He has had complications with diabetes and heart disease.  We have actually seen him in the past for lumbar spine issues.  His course is also complicated by morbid obesity.  ROS Otherwise per HPI.  Assessment & Plan: Visit Diagnoses:  1. Pain in left hip     Plan: Findings:  We did achieve decent arthrogram picture as well as patient did get relief during the anesthetic phase.    Meds & Orders: No orders of the defined types were placed in this encounter.   Orders Placed This Encounter  Procedures   Large Joint Inj   C-ARM NO Order    Follow-up: No follow-ups on file.   Procedures: Large Joint Inj: L hip joint on 04/14/2019 5:56 AM Indications: pain and diagnostic evaluation Details: 22 G needle, anterior approach  Arthrogram: Yes  Medications: 80 mg triamcinolone acetonide 40 MG/ML; 4 mL bupivacaine 0.25 % Outcome: tolerated well, no immediate complications  Arthrogram demonstrated excellent flow of contrast throughout the joint surface without extravasation or obvious defect.  The patient had relief of symptoms during the anesthetic phase of the injection.  Procedure, treatment alternatives, risks and benefits explained, specific risks discussed. Consent was given by the patient. Immediately prior to procedure a time out was called to verify the correct  patient, procedure, equipment, support staff and site/side marked as required. Patient was prepped and draped in the usual sterile fashion.      No notes on file   Clinical History: MRI LUMBAR SPINE WITHOUT AND WITH CONTRAST  TECHNIQUE: Multiplanar and multiecho pulse sequences of the lumbar spine were obtained without and with intravenous contrast.  CONTRAST:  15m MULTIHANCE GADOBENATE DIMEGLUMINE 529 MG/ML IV SOLN  COMPARISON:  MRI lumbar spine August 30, 2015  FINDINGS: SEGMENTATION: For the purposes of this report, the last well-formed intervertebral disc is reported as L5-S1.  ALIGNMENT: Maintained lumbar lordosis. Minimal grade 1 L2-3 retrolisthesis without spondylolysis. Broad dextroscoliosis evident on axial sequences.  VERTEBRAE: Vertebral bodies are intact. Severe L5-S1 disc height loss, unchanged. Worsened severe L2-3 and a moderate L4-5 disc height loss with desiccation. Stable mild disc height loss at the remaining levels with desiccation. Multilevel mild and moderate chronic discogenic endplate changes without acute or abnormal bone marrow signal. Mild ventral disc enhancement T11-12 and T12-L1 associated mild disc edema.  CONUS MEDULLARIS AND CAUDA EQUINA: Conus medullaris terminates at T12-L1 and demonstrates normal morphology and signal characteristics. Cauda equina is normal. No abnormal cord, leptomeningeal or epidural enhancement.  PARASPINAL AND OTHER SOFT TISSUES: Nonacute. Moderate to severe lower paraspinal muscle atrophy slightly progressed from prior imaging.  DISC LEVELS:  T12-L1: No disc bulge, canal stenosis nor neural foraminal narrowing.  L1-2: Moderate broad-based disc bulge, superimposed large RIGHT extraforaminal disc protrusion similar to prior examination which could affect the exited  RIGHT L1 nerve. Mild facet arthropathy and ligamentum flavum redundancy. Mild canal stenosis. Mild bilateral caudal neural foraminal  narrowing.  L2-3: Retrolisthesis. Endplate spurring, mild facet arthropathy and ligamentum flavum redundancy. Minimal canal stenosis. Minimal neural foraminal narrowing.  L3-4: Annular bulging eccentric laterally. Moderate facet arthropathy. No canal stenosis. Mild bilateral neural foraminal narrowing.  L4-5: Posterior decompression without canal stenosis. Similar to decreased small broad-based disc bulge. Mild facet arthropathy. Moderate bilateral neural foraminal narrowing. Similar encroachment upon the exited RIGHT L4 nerve.  L5-S1: Endplate spurring without disc bulge. Mild facet arthropathy. No canal stenosis. Mild RIGHT and minimal LEFT neural foraminal narrowing.  IMPRESSION: 1. Status post L4-5 laminectomies. 2. Stable minimal L2-3 retrolisthesis without spondylolysis. Progressed spondylosis without fracture or acute osseous process. 3. Mild canal stenosis L1-2, minimal at L2-3. 4. Neural foraminal narrowing L1-2 through L5-S1: Moderate at L4-5.   Electronically Signed   By: Elon Alas M.D.   On: 09/05/2017 13:57   He reports that he quit smoking about 8 years ago. His smoking use included cigars. He has never used smokeless tobacco.  Recent Labs    06/03/18 1333 01/24/19 1111  HGBA1C 5.2 5.8*    Objective:  VS:  HT:     WT:    BMI:      BP:    HR: bpm   TEMP: ( )   RESP:  Physical Exam  Ortho Exam Imaging: No results found.  Past Medical/Family/Surgical/Social History: Medications & Allergies reviewed per EMR, new medications updated. Patient Active Problem List   Diagnosis Date Noted   S/P laparoscopic sleeve gastrectomy 03/03/2019   Rash 01/24/2019   Fever 08/15/2018   UTI (urinary tract infection) 08/15/2018   Atypical chest pain 08/15/2018   Fall at home, initial encounter 08/15/2018   Chronic anemia 08/15/2018   Hypokalemia 08/15/2018   Bowel incontinence 07/25/2018   Other spondylosis with radiculopathy, lumbar region  07/16/2018    Class: Chronic   Status post lumbar laminectomy 07/16/2018   Status post THR (total hip replacement) 04/17/2018   Unilateral primary osteoarthritis, right hip    Morbid obesity with BMI of 40.0-44.9, adult ([place]) 02/28/2018   Myocardial infarction ([place]) 02/25/2018   Coronary artery disease involving native coronary artery of native heart 02/25/2018   PAD (peripheral artery disease) ([place]) 02/25/2018   S/P BKA (below knee amputation) unilateral, right ([place]) 02/25/2018   Sacroiliitis ([place]) 02/25/2018   SOB (shortness of breath) 01/03/2018   Acute on chronic heart failure ([place]) 01/03/2018   Severe right groin pain 12/20/2017   Morbid obesity ([place]) 10/09/2017   Exposure to sexually transmitted disease (STD) 07/14/2017   Low back pain 07/13/2017   Leukoplakia, tongue 01/26/2017   Acute sinus infection 10/20/2016   Hyperglycemia 10/20/2016   Skin lesion 10/20/2016   S/P unilateral BKA (below knee amputation), right ([place]) 06/14/2016   Charcot foot due to diabetes mellitus ([place])    Charcot's arthropathy associated with type 2 diabetes mellitus ([place]) 04/11/2016   Preventative health care 11/05/2015   Major depression 09/13/2015   S/P TKR (total knee replacement) bilaterally 09/13/2015   GERD (gastroesophageal reflux disease) 09/08/2015   S/P laparoscopic hernia repair 05/11/2015   PPD positive 04/08/2015   Benign neoplasm of descending colon    Benign neoplasm of cecum    Acute blood loss anemia    Chronic anticoagulation    Occult blood positive stool 10/17/2014   General weakness 07/14/2014   Urinary incontinence 07/14/2014   Headache(784.0) 10/15/2013   Spinal stenosis in  cervical region 09/26/2013    Class: Chronic   Spinal stenosis of lumbar region 09/26/2013    Class: Chronic   Hand joint pain 06/10/2013   Rotator cuff tear arthropathy of both shoulders 06/10/2013   Skin lesion of cheek 05/01/2013   Pain of right thumb  04/03/2013   Balance disorder 03/12/2013   Gait disorder 03/12/2013   Tremor 03/12/2013   Left hip pain 03/12/2013   Pre-ulcerative corn or callous 02/06/2013   Anxiety 11/12/2011   OSA on CPAP 11/07/2011   Bradycardia 10/20/2011   Insomnia 10/04/2011   Obesity 01/12/2011   Diabetes mellitus type 2 in obese ([place]) 09/27/2010   ERECTILE DYSFUNCTION, ORGANIC 05/30/2010   [place] DISEASE, LUMBAR 04/19/2010   SCIATICA, LEFT 04/19/2010   Chronic pain syndrome 10/27/2009   Hyperlipidemia 07/15/2009   Essential hypertension 06/24/2009   Coronary artery disease involving native coronary artery of native heart without angina pectoris 06/24/2009   Allergic rhinitis 06/24/2009   URETHRAL STRICTURE 06/24/2009   DEGENERATIVE JOINT DISEASE 06/24/2009   SHOULDER PAIN, BILATERAL 06/24/2009   FATIGUE 06/24/2009   NEPHROLITHIASIS, HX OF 06/24/2009   Past Medical History:  Diagnosis Date   ALLERGIC RHINITIS 06/24/2009   Allergy    Anxiety 11/12/2011   Adequate for discharge    Arthritis    "all my joints" (09/30/2013)   Arthritis of foot, right, degenerative 04/15/2014   Balance disorder 03/12/2013   Benign neoplasm of cecum    Benign neoplasm of descending colon    CAD (coronary artery disease) 06/24/2009   5 stents placed in 2007     Chronic anticoagulation    Chronic pain syndrome 10/27/2009   of ankle, shoulders, low back.  sciatica.    Closed fracture of right foot 10/17/2014   CORONARY ARTERY DISEASE 06/24/2009   a. s/p multiple PCIs - In 2008 he had a Taxus DES to the mild LAD, Endeavor DES to mid LCX and distal LCX. In January 2009 he had DES to distal LCX, mid LCX and proximal LCX. In November 2009 had BMS x 2 to the mid RCA. Cath 10/2011 with patent stents, noncardiac CP. LHC 01/2013: patent stents (noncardiac CP).   DEGENERATIVE JOINT DISEASE 06/24/2009   Qualifier: Diagnosis of  By: [name], [name]    Depression    Depression with anxiety    Prior  suicide attempt   [place], LUMBAR 04/19/2010   ERECTILE DYSFUNCTION, ORGANIC 05/30/2010   Essential hypertension 06/24/2009   Qualifier: Diagnosis of  By: [name], [name]    Fibromyalgia    Fracture dislocation of ankle joint 09/02/2015   Gait disorder 03/12/2013   General weakness 07/14/2014   GERD (gastroesophageal reflux disease) 09/08/2015   Hand joint pain 06/10/2013   Heart murmur    Hepatitis C    treated pt. unknown with what he was a teenager   History of kidney stones    Hyperlipidemia 07/15/2009   Qualifier: Diagnosis of  By: Aundra Dubin, MD, [place]     HYPERLIPIDEMIA-MIXED 07/15/2009   HYPERTENSION 06/24/2009   Insomnia 10/04/2011   Irregular heart beat    Left hip pain 03/12/2013   Injected under ultrasound guidance on June 24, 2013    Major depression 09/13/2015   Myocardial infarction [facility]) 2008   Non-cardiac chest pain 10/2011, 01/2013   Obesity    Occult blood positive stool 10/17/2014   Open ankle fracture 09/02/2015   OSA (obstructive sleep apnea)    not using CPAP (09/30/2013)   Pain of  right thumb 04/03/2013   Pneumonia    PPD positive 04/08/2015   Pre-ulcerative corn or callous 02/06/2013   Rotator cuff tear arthropathy of both shoulders 06/10/2013   History of bilateral shoulder cuff surgery for rotator cuff tears. Reports increase in pain 09/11/2015 during physical therapy of the left shoulder.    SCIATICA, LEFT 04/19/2010   Qualifier: Diagnosis of  By: [name], [name]    Sleep apnea    wears cpap    Spinal stenosis in cervical region 09/26/2013   Spinal stenosis, lumbar region, with neurogenic claudication 09/26/2013   Type II diabetes mellitus ([place]) 2012   no meds in 09/2014.    Uncontrolled type 2 DM with peripheral circulatory disorder ([place]) 10/04/2013   URETHRAL STRICTURE 06/24/2009   self catheterizes.    Family History  Problem Relation Age of Onset   Depression Mother    Heart disease Mother    Hypertension Mother      Breast cancer Mother    Cancer Mother    Diabetes Father    Heart disease Father        CABG   Hypertension Father    Hyperlipidemia Father    Prostate cancer Father    Skin cancer Father    Depression Brother        x 2   Hypertension Brother        x2   Healthy Son    Heart disease Maternal Grandfather    Early death Maternal Grandfather    Heart attack Maternal Grandfather 82   Early death Paternal Grandfather    Coronary artery disease Other    Hypertension Other    Depression Other    Healthy Son    Colon cancer Neg Hx    Colon polyps Neg Hx    Esophageal cancer Neg Hx    Rectal cancer Neg Hx    Stomach cancer Neg Hx    Past Surgical History:  Procedure Laterality Date   AMPUTATION Right 06/14/2016   Procedure: AMPUTATION BELOW KNEE;  Surgeon: Newt Minion, MD;  Location: [place];  Service: Orthopedics;  Laterality: Right;   ANKLE FUSION Right 04/15/2014   Procedure: Right Subtalar, Talonavicular Fusion;  Surgeon: Newt Minion, MD;  Location: [place];  Service: Orthopedics;  Laterality: Right;   ANKLE FUSION Right 04/18/2016   Procedure: Right Ankle Tibiocalcaneal Fusion;  Surgeon: Newt Minion, MD;  Location: [place];  Service: Orthopedics;  Laterality: Right;   ANTERIOR CERVICAL DECOMP/DISCECTOMY FUSION N/A 09/26/2013   Procedure: ANTERIOR CERVICAL DISCECTOMY FUSION C3-4, plate and screw fixation, allograft bone graft;  Surgeon: Jessy Oto, MD;  Location: [place];  Service: Orthopedics;  Laterality: N/A;   BACK SURGERY     BELOW KNEE LEG AMPUTATION Right 06/14/2016   right ankle and foot   CARDIAC CATHETERIZATION  X 1   CARPAL TUNNEL RELEASE Bilateral    COLONOSCOPY     COLONOSCOPY N/A 10/22/2014   Procedure: COLONOSCOPY;  Surgeon: Lafayette Dragon, MD;  Location: [facility] ENDOSCOPY;  Service: Endoscopy;  Laterality: N/A;   CORONARY ANGIOPLASTY WITH STENT PLACEMENT     "I have 9 stents"   ESOPHAGOGASTRODUODENOSCOPY N/A 10/19/2014    Procedure: ESOPHAGOGASTRODUODENOSCOPY (EGD);  Surgeon: Jerene Bears, MD;  Location: [facility] ENDOSCOPY;  Service: Endoscopy;  Laterality: N/A;   FRACTURE SURGERY     FUSION OF TALONAVICULAR JOINT Right 04/15/2014   dr duda   HERNIA REPAIR     umbilical   INGUINAL HERNIA  REPAIR Right 05/11/2015   Procedure: LAPAROSCOPIC REPAIR RIGHT  INGUINAL HERNIA;  Surgeon: Greer Pickerel, MD;  Location: [place];  Service: General;  Laterality: Right;   INSERTION OF MESH Right 05/11/2015   Procedure: INSERTION OF MESH;  Surgeon: Greer Pickerel, MD;  Location: [place];  Service: General;  Laterality: Right;   JOINT REPLACEMENT     KNEE CARTILAGE SURGERY Right X 12   "~ 1/2 open; ~ 1/2 scopes"   KNEE CARTILAGE SURGERY Left X 3   "3 scopes"   LAPAROSCOPIC GASTRIC SLEEVE RESECTION N/A 03/03/2019   Procedure: LAPAROSCOPIC GASTRIC SLEEVE RESECTION, Upper Endo, ERAS Pathway;  Surgeon: Greer Pickerel, MD;  Location: [facility];  Service: General;  Laterality: N/A;   LEFT HEART CATHETERIZATION WITH CORONARY ANGIOGRAM N/A 02/10/2013   Procedure: LEFT HEART CATHETERIZATION WITH CORONARY ANGIOGRAM;  Surgeon: Burnell Blanks, MD;  Location: [facility] CATH LAB;  Service: Cardiovascular;  Laterality: N/A;   LUMBAR LAMINECTOMY N/A 07/16/2018   Procedure: LEFT L4-5 REDO PARTIAL LUMBAR HEMILAMINECTOMY WITH FORAMINOTOMY LEFT L4;  Surgeon: Jessy Oto, MD;  Location: [place];  Service: Orthopedics;  Laterality: N/A;   LUMBAR LAMINECTOMY/DECOMPRESSION MICRODISCECTOMY N/A 01/27/2014   Procedure: CENTRAL LUMBAR LAMINECTOMY L4-5 AND L3-4;  Surgeon: Jessy Oto, MD;  Location: [place];  Service: Orthopedics;  Laterality: N/A;   ORIF ANKLE FRACTURE Right 09/02/2015   Procedure: OPEN REDUCTION INTERNAL FIXATION (ORIF) ANKLE FRACTURE;  Surgeon: Newt Minion, MD;  Location: [place];  Service: Orthopedics;  Laterality: Right;   PERIPHERALLY INSERTED CENTRAL CATHETER INSERTION  09/02/2015   POLYPECTOMY     SHOULDER ARTHROSCOPY W/ ROTATOR CUFF  REPAIR Bilateral    "3 on the right; 1 on the left"   SKIN SPLIT GRAFT Right 10/01/2015   Procedure: RIGHT ANKLE APPLY SKIN GRAFT SPLIT THICKNESS;  Surgeon: Newt Minion, MD;  Location: [place];  Service: Orthopedics;  Laterality: Right;   TONSILLECTOMY     TOOTH EXTRACTION     TOTAL HIP ARTHROPLASTY Right 04/17/2018   TOTAL HIP ARTHROPLASTY Right 04/17/2018   Procedure: RIGHT TOTAL HIP ARTHROPLASTY;  Surgeon: Newt Minion, MD;  Location: [place];  Service: Orthopedics;  Laterality: Right;   TOTAL KNEE ARTHROPLASTY Bilateral 6962   UMBILICAL HERNIA REPAIR     UHR   UPPER GASTROINTESTINAL ENDOSCOPY     URETHRAL DILATION  X 4   VASECTOMY     WISDOM TOOTH EXTRACTION     Social History   Occupational History   Occupation: disabled since 2006 due to ortho. heart, Air traffic controller: UNEMPLOYED   Occupation: part time work as an Multimedia programmer, wrestling, and baseball coach [place]  Tobacco Use   Smoking status: Former Smoker    Types: Cigars    Quit date: 08/28/2010    Years since quitting: 8.6   Smokeless tobacco: Never Used   Tobacco comment: 04/18/2016 "smoked 1 cigar/wk when I did smoke"  Substance and Sexual Activity   Alcohol use: Not Currently    Alcohol/week: 0.0 standard drinks    Comment: rarely   Drug use: No   Sexual activity: Not Currently

## 2019-04-16 ENCOUNTER — Ambulatory Visit (INDEPENDENT_AMBULATORY_CARE_PROVIDER_SITE_OTHER): Payer: Medicare HMO | Admitting: Orthopaedic Surgery

## 2019-04-16 ENCOUNTER — Encounter: Payer: Self-pay | Admitting: Orthopaedic Surgery

## 2019-04-16 ENCOUNTER — Other Ambulatory Visit: Payer: Self-pay

## 2019-04-16 VITALS — Ht 72.0 in | Wt 306.0 lb

## 2019-04-16 DIAGNOSIS — M1612 Unilateral primary osteoarthritis, left hip: Secondary | ICD-10-CM | POA: Diagnosis not present

## 2019-04-16 DIAGNOSIS — R6889 Other general symptoms and signs: Secondary | ICD-10-CM | POA: Diagnosis not present

## 2019-04-16 NOTE — Progress Notes (Signed)
Office Visit Note   Patient: Travis POLLAN Sr.           Date of Birth: 1950-09-24           MRN: [id number] Visit Date: 04/16/2019              Requested by: [name], MD [address] [place],  [place] [address] PCP: [name], MD   Assessment & Plan: Visit Diagnoses:  1. Unilateral primary osteoarthritis, left hip     Plan: I agree with proceeding with a left hip replacement through direct anterior approach. When I had him lay on the exam table in the supine position I am very comfortable with the soft tissue planes into his hip. Although his BMI is 41.5 he is now lost 30 pounds in just over a month out from bariatric surgery. He understands that I would not be able to perform surgery for least 5 to 6 weeks due to our surgery schedule. I do feel comfortable going ahead and in setting up for that surgery because I feel confident that his BMI will be in the acceptable range of 40 or below by the time we proceed with surgery due to the weight loss he is already experienced in a short period of time from his bariatric surgery. I showed him a hip model explained in detail what anterior hip surgery involves. We had a long thorough discussion about the risk and benefits of surgery. I gave him a handout about it as well. All question concerns were answered and addressed.  Follow-Up Instructions: Return for 2 weeks post-op.   Orders:  No orders of the defined types were placed in this encounter.  No orders of the defined types were placed in this encounter.     Procedures: No procedures performed   Clinical Data: No additional findings.   Subjective: Chief Complaint  Patient presents with   Left Hip - Follow-up  The patient comes in today for [place] to consider setting him up for a left total hip arthroplasty. He has known and well-documented severe arthritis in his left hip. He actually had a right total hip arthroplasty done by Dr.Duda last year through a  posterior approach secondary to having a right below-knee prosthesis. He has a normal leg on his left side. He does report severe groin pain. He has had intra-articular steroid injections in is only suppressed his pain minimally. He is someone who does weigh 306 pounds. His BMI is 41.5. However he did have bariatric surgery in just October of this year. He has lost 30 pounds since then. He is not a diabetic. He walks with a cane. His left hip pain is definitely affecting his mobility, his quality of life and his activities daily living. His pain can be 10 out of 10 at times. Is been getting worse for over a year now.  HPI  Review of Systems He currently denies any headache, chest pain, shortness of breath, fever, chills, nausea, vomiting  Objective: Vital Signs: Ht 6' (1.829 m)    Wt (!) 306 lb (138.8 kg)    BMI 41.50 kg/m   Physical Exam He is alert and orient x3 and in no acute distress. He is walking with a cane. He has a significant Trendelenburg gait as well. Ortho Exam Examination of his right replaced hip shows that it moves smoothly with good internal and external rotation and no pain in the groin. His left hip is incredibly  stiff with internal and external rotation and severe pain with rotation of the hip. Specialty Comments:  No specialty comments available.  Imaging: No results found. X-rays on the canopy system reviewed of his left hip does show evidence of femoral acetabular impingement. There is significant joint space narrowing as well.  PMFS History: Patient Active Problem List   Diagnosis Date Noted   S/P laparoscopic sleeve gastrectomy 03/03/2019   Rash 01/24/2019   Fever 08/15/2018   UTI (urinary tract infection) 08/15/2018   Atypical chest pain 08/15/2018   Fall at home, initial encounter 08/15/2018   Chronic anemia 08/15/2018   Hypokalemia 08/15/2018   Bowel incontinence 07/25/2018   Other spondylosis with radiculopathy, lumbar region 07/16/2018     Class: Chronic   Status post lumbar laminectomy 07/16/2018   Status post THR (total hip replacement) 04/17/2018   Unilateral primary osteoarthritis, right hip    Morbid obesity with BMI of 40.0-44.9, adult ([place]) 02/28/2018   Myocardial infarction ([place]) 02/25/2018   Coronary artery disease involving native coronary artery of native heart 02/25/2018   PAD (peripheral artery disease) ([place]) 02/25/2018   S/P BKA (below knee amputation) unilateral, right ([place]) 02/25/2018   Sacroiliitis ([place]) 02/25/2018   SOB (shortness of breath) 01/03/2018   Acute on chronic heart failure ([place]) 01/03/2018   Severe right groin pain 12/20/2017   Morbid obesity ([place]) 10/09/2017   Exposure to sexually transmitted disease (STD) 07/14/2017   Low back pain 07/13/2017   Leukoplakia, tongue 01/26/2017   Acute sinus infection 10/20/2016   Hyperglycemia 10/20/2016   Skin lesion 10/20/2016   S/P unilateral BKA (below knee amputation), right ([place]) 06/14/2016   Charcot foot due to diabetes mellitus ([place])    Charcot's arthropathy associated with type 2 diabetes mellitus ([place]) 04/11/2016   Preventative health care 11/05/2015   Major depression 09/13/2015   S/P TKR (total knee replacement) bilaterally 09/13/2015   GERD (gastroesophageal reflux disease) 09/08/2015   S/P laparoscopic hernia repair 05/11/2015   PPD positive 04/08/2015   Benign neoplasm of descending colon    Benign neoplasm of cecum    Acute blood loss anemia    Chronic anticoagulation    Occult blood positive stool 10/17/2014   General weakness 07/14/2014   Urinary incontinence 07/14/2014   Headache(784.0) 10/15/2013   Spinal stenosis in cervical region 09/26/2013    Class: Chronic   Spinal stenosis of lumbar region 09/26/2013    Class: Chronic   Hand joint pain 06/10/2013   Rotator cuff tear arthropathy of both shoulders 06/10/2013   Skin lesion of cheek 05/01/2013   Pain of right thumb 04/03/2013    Balance disorder 03/12/2013   Gait disorder 03/12/2013   Tremor 03/12/2013   Left hip pain 03/12/2013   Pre-ulcerative corn or callous 02/06/2013   Anxiety 11/12/2011   OSA on CPAP 11/07/2011   Bradycardia 10/20/2011   Insomnia 10/04/2011   Obesity 01/12/2011   Diabetes mellitus type 2 in obese ([place]) 09/27/2010   ERECTILE DYSFUNCTION, ORGANIC 05/30/2010   [place] DISEASE, LUMBAR 04/19/2010   SCIATICA, LEFT 04/19/2010   Chronic pain syndrome 10/27/2009   Hyperlipidemia 07/15/2009   Essential hypertension 06/24/2009   Coronary artery disease involving native coronary artery of native heart without angina pectoris 06/24/2009   Allergic rhinitis 06/24/2009   URETHRAL STRICTURE 06/24/2009   DEGENERATIVE JOINT DISEASE 06/24/2009   SHOULDER PAIN, BILATERAL 06/24/2009   FATIGUE 06/24/2009   NEPHROLITHIASIS, HX OF 06/24/2009   Past Medical History:  Diagnosis Date   ALLERGIC RHINITIS 06/24/2009  Allergy    Anxiety 11/12/2011   Adequate for discharge    Arthritis    "all my joints" (09/30/2013)   Arthritis of foot, right, degenerative 04/15/2014   Balance disorder 03/12/2013   Benign neoplasm of cecum    Benign neoplasm of descending colon    CAD (coronary artery disease) 06/24/2009   5 stents placed in 2007     Chronic anticoagulation    Chronic pain syndrome 10/27/2009   of ankle, shoulders, low back.  sciatica.    Closed fracture of right foot 10/17/2014   CORONARY ARTERY DISEASE 06/24/2009   a. s/p multiple PCIs - In 2008 he had a Taxus DES to the mild LAD, Endeavor DES to mid LCX and distal LCX. In January 2009 he had DES to distal LCX, mid LCX and proximal LCX. In November 2009 had BMS x 2 to the mid RCA. Cath 10/2011 with patent stents, noncardiac CP. LHC 01/2013: patent stents (noncardiac CP).   DEGENERATIVE JOINT DISEASE 06/24/2009   Qualifier: Diagnosis of  By: [name], [name]    Depression    Depression with anxiety    Prior suicide attempt    [place], LUMBAR 04/19/2010   ERECTILE DYSFUNCTION, ORGANIC 05/30/2010   Essential hypertension 06/24/2009   Qualifier: Diagnosis of  By: [name], [name]    Fibromyalgia    Fracture dislocation of ankle joint 09/02/2015   Gait disorder 03/12/2013   General weakness 07/14/2014   GERD (gastroesophageal reflux disease) 09/08/2015   Hand joint pain 06/10/2013   Heart murmur    Hepatitis C    treated pt. unknown with what he was a teenager   History of kidney stones    Hyperlipidemia 07/15/2009   Qualifier: Diagnosis of  By: Aundra Dubin, MD, [place]     HYPERLIPIDEMIA-MIXED 07/15/2009   HYPERTENSION 06/24/2009   Insomnia 10/04/2011   Irregular heart beat    Left hip pain 03/12/2013   Injected under ultrasound guidance on June 24, 2013    Major depression 09/13/2015   Myocardial infarction [facility]) 2008   Non-cardiac chest pain 10/2011, 01/2013   Obesity    Occult blood positive stool 10/17/2014   Open ankle fracture 09/02/2015   OSA (obstructive sleep apnea)    not using CPAP (09/30/2013)   Pain of right thumb 04/03/2013   Pneumonia    PPD positive 04/08/2015   Pre-ulcerative corn or callous 02/06/2013   Rotator cuff tear arthropathy of both shoulders 06/10/2013   History of bilateral shoulder cuff surgery for rotator cuff tears. Reports increase in pain 09/11/2015 during physical therapy of the left shoulder.    SCIATICA, LEFT 04/19/2010   Qualifier: Diagnosis of  By: [name], [name]    Sleep apnea    wears cpap    Spinal stenosis in cervical region 09/26/2013   Spinal stenosis, lumbar region, with neurogenic claudication 09/26/2013   Type II diabetes mellitus ([place]) 2012   no meds in 09/2014.    Uncontrolled type 2 DM with peripheral circulatory disorder ([place]) 10/04/2013   URETHRAL STRICTURE 06/24/2009   self catheterizes.     Family History  Problem Relation Age of Onset   Depression Mother    Heart disease Mother    Hypertension Mother    Breast cancer  Mother    Cancer Mother    Diabetes Father    Heart disease Father        CABG   Hypertension Father    Hyperlipidemia Father    Prostate  cancer Father    Skin cancer Father    Depression Brother        x 2   Hypertension Brother        x2   Healthy Son    Heart disease Maternal Grandfather    Early death Maternal Grandfather    Heart attack Maternal Grandfather 51   Early death Paternal Grandfather    Coronary artery disease Other    Hypertension Other    Depression Other    Healthy Son    Colon cancer Neg Hx    Colon polyps Neg Hx    Esophageal cancer Neg Hx    Rectal cancer Neg Hx    Stomach cancer Neg Hx     Past Surgical History:  Procedure Laterality Date   AMPUTATION Right 06/14/2016   Procedure: AMPUTATION BELOW KNEE;  Surgeon: Newt Minion, MD;  Location: [facility];  Service: Orthopedics;  Laterality: Right;   ANKLE FUSION Right 04/15/2014   Procedure: Right Subtalar, Talonavicular Fusion;  Surgeon: Newt Minion, MD;  Location: [facility];  Service: Orthopedics;  Laterality: Right;   ANKLE FUSION Right 04/18/2016   Procedure: Right Ankle Tibiocalcaneal Fusion;  Surgeon: Newt Minion, MD;  Location: [facility];  Service: Orthopedics;  Laterality: Right;   ANTERIOR CERVICAL DECOMP/DISCECTOMY FUSION N/A 09/26/2013   Procedure: ANTERIOR CERVICAL DISCECTOMY FUSION C3-4, plate and screw fixation, allograft bone graft;  Surgeon: Jessy Oto, MD;  Location: [facility];  Service: Orthopedics;  Laterality: N/A;   BACK SURGERY     BELOW KNEE LEG AMPUTATION Right 06/14/2016   right ankle and foot   CARDIAC CATHETERIZATION  X 1   CARPAL TUNNEL RELEASE Bilateral    COLONOSCOPY     COLONOSCOPY N/A 10/22/2014   Procedure: COLONOSCOPY;  Surgeon: Lafayette Dragon, MD;  Location[facility] ENDOSCOPY;  Service: Endoscopy;  Laterality: N/A;   CORONARY ANGIOPLASTY WITH STENT PLACEMENT     "I have 9 stents"   ESOPHAGOGASTRODUODENOSCOPY N/A 10/19/2014   Procedure:  ESOPHAGOGASTRODUODENOSCOPY (EGD);  Surgeon: Jerene Bears, MD;  Location: [facility] ENDOSCOPY;  Service: Endoscopy;  Laterality: N/A;   FRACTURE SURGERY     FUSION OF TALONAVICULAR JOINT Right 04/15/2014   dr duda   HERNIA REPAIR     umbilical   INGUINAL HERNIA REPAIR Right 05/11/2015   Procedure: LAPAROSCOPIC REPAIR RIGHT  INGUINAL HERNIA;  Surgeon: Greer Pickerel, MD;  Location: [facility];  Service: General;  Laterality: Right;   INSERTION OF MESH Right 05/11/2015   Procedure: INSERTION OF MESH;  Surgeon: Greer Pickerel, MD;  Location: [facility];  Service: General;  Laterality: Right;   JOINT REPLACEMENT     KNEE CARTILAGE SURGERY Right X 12   "~ 1/2 open; ~ 1/2 scopes"   KNEE CARTILAGE SURGERY Left X 3   "3 scopes"   LAPAROSCOPIC GASTRIC SLEEVE RESECTION N/A 03/03/2019   Procedure: LAPAROSCOPIC GASTRIC SLEEVE RESECTION, Upper Endo, ERAS Pathway;  Surgeon: Greer Pickerel, MD;  Location: [facility];  Service: General;  Laterality: N/A;   LEFT HEART CATHETERIZATION WITH CORONARY ANGIOGRAM N/A 02/10/2013   Procedure: LEFT HEART CATHETERIZATION WITH CORONARY ANGIOGRAM;  Surgeon: Burnell Blanks, MD;  Location: [facility] CATH LAB;  Service: Cardiovascular;  Laterality: N/A;   LUMBAR LAMINECTOMY N/A 07/16/2018   Procedure: LEFT L4-5 REDO PARTIAL LUMBAR HEMILAMINECTOMY WITH FORAMINOTOMY LEFT L4;  Surgeon: Jessy Oto, MD;  Location: [facility];  Service: Orthopedics;  Laterality: N/A;   LUMBAR LAMINECTOMY/DECOMPRESSION MICRODISCECTOMY N/A 01/27/2014   Procedure: CENTRAL  LUMBAR LAMINECTOMY L4-5 AND L3-4;  Surgeon: Jessy Oto, MD;  Location: [facility];  Service: Orthopedics;  Laterality: N/A;   ORIF ANKLE FRACTURE Right 09/02/2015   Procedure: OPEN REDUCTION INTERNAL FIXATION (ORIF) ANKLE FRACTURE;  Surgeon: Newt Minion, MD;  Location: [facility];  Service: Orthopedics;  Laterality: Right;   PERIPHERALLY INSERTED CENTRAL CATHETER INSERTION  09/02/2015   POLYPECTOMY     SHOULDER ARTHROSCOPY W/ ROTATOR CUFF REPAIR  Bilateral    "3 on the right; 1 on the left"   SKIN SPLIT GRAFT Right 10/01/2015   Procedure: RIGHT ANKLE APPLY SKIN GRAFT SPLIT THICKNESS;  Surgeon: Newt Minion, MD;  Location: [facility];  Service: Orthopedics;  Laterality: Right;   TONSILLECTOMY     TOOTH EXTRACTION     TOTAL HIP ARTHROPLASTY Right 04/17/2018   TOTAL HIP ARTHROPLASTY Right 04/17/2018   Procedure: RIGHT TOTAL HIP ARTHROPLASTY;  Surgeon: Newt Minion, MD;  Location: [facility];  Service: Orthopedics;  Laterality: Right;   TOTAL KNEE ARTHROPLASTY Bilateral 3151   UMBILICAL HERNIA REPAIR     UHR   UPPER GASTROINTESTINAL ENDOSCOPY     URETHRAL DILATION  X 4   VASECTOMY     WISDOM TOOTH EXTRACTION     Social History   Occupational History   Occupation: disabled since 2006 due to ortho. heart, Air traffic controller: UNEMPLOYED   Occupation: part time work as an Multimedia programmer, wrestling, and baseball coach [employer]  Tobacco Use   Smoking status: Former Smoker    Types: Cigars    Quit date: 08/28/2010    Years since quitting: 8.6   Smokeless tobacco: Never Used   Tobacco comment: 04/18/2016 "smoked 1 cigar/wk when I did smoke"  Substance and Sexual Activity   Alcohol use: Not Currently    Alcohol/week: 0.0 standard drinks    Comment: rarely   Drug use: No   Sexual activity: Not Currently

## 2019-04-21 ENCOUNTER — Other Ambulatory Visit: Payer: Self-pay | Admitting: Internal Medicine

## 2019-04-22 ENCOUNTER — Other Ambulatory Visit (HOSPITAL_COMMUNITY): Payer: Self-pay | Admitting: Psychiatry

## 2019-04-22 DIAGNOSIS — F319 Bipolar disorder, unspecified: Secondary | ICD-10-CM

## 2019-04-22 DIAGNOSIS — F411 Generalized anxiety disorder: Secondary | ICD-10-CM

## 2019-04-24 DIAGNOSIS — M47816 Spondylosis without myelopathy or radiculopathy, lumbar region: Secondary | ICD-10-CM | POA: Diagnosis not present

## 2019-04-25 DIAGNOSIS — R6889 Other general symptoms and signs: Secondary | ICD-10-CM | POA: Diagnosis not present

## 2019-04-29 ENCOUNTER — Encounter: Payer: Medicare HMO | Attending: General Surgery | Admitting: Dietician

## 2019-04-29 ENCOUNTER — Other Ambulatory Visit: Payer: Self-pay

## 2019-04-29 ENCOUNTER — Encounter: Payer: Self-pay | Admitting: Dietician

## 2019-04-29 DIAGNOSIS — Z6841 Body Mass Index (BMI) 40.0 and over, adult: Secondary | ICD-10-CM | POA: Insufficient documentation

## 2019-04-29 DIAGNOSIS — E669 Obesity, unspecified: Secondary | ICD-10-CM

## 2019-04-29 DIAGNOSIS — Z713 Dietary counseling and surveillance: Secondary | ICD-10-CM | POA: Diagnosis not present

## 2019-04-29 DIAGNOSIS — R6889 Other general symptoms and signs: Secondary | ICD-10-CM | POA: Diagnosis not present

## 2019-04-29 NOTE — Progress Notes (Signed)
Bariatric Nutrition Follow-Up Visit Medical Nutrition Therapy  Appt Start Time: 11:45am   End Time: 12:10pm  2 Months Post-Operative Sleeve Surgery Surgery Date: 03/03/2019  Pt's Expectations of Surgery/ Goals: to help, understanding it will not "do all the work"   NUTRITION ASSESSMENT  Anthropometrics (*all weights include prosthesis)  Start weight at [facility]: 317.8 lbs (date: 01/17/2018)  Today's weight: 308.5 lbs   01/17/2018 07/15/2018 12/30/2018 04/29/2019  Weight  lbs 317.8 328.6 337.1 308.5  BMI  41.9 43.3 44.5 40.7    Lifestyle & Dietary Hx Pt reports no issues or concerns regarding diet. States he is concerned because his weight has stalled. Pt consistently eats only protein foods (stays within diet phase guidelines) and drinks lots of fluids (80+ ounces per day.) Pt states he walks for an hour total every day. [employer] on [address], and will only eat the protein foods from the meal. States he does experience some constipation. States he sees his therapist regularly to help manage his depression, has a hx of emotional eating.   Estimated daily fluid intake: 80 oz Estimated daily protein intake: 80+ g Supplements: bariatric MVI, calcium 3x/day  Current average weekly physical activity: walking (1 hour/day)    24-Hr Dietary Recall First Meal: egg Snack: protein shake   Second Meal: Meals on Wheels (eats the protein foods only)  Snack: low-fat hot dog   Third Meal: meat  Snack: - Beverages: water, Gatorade Zero   Post-Op Goals/ Signs/ Symptoms Using straws: no Drinking while eating: no Chewing/swallowing difficulties: chewing (poor dentition; states he is getting new teeth soon)  Changes in vision: no Changes to mood/headaches: no Hair loss/changes to skin/nails: no Difficulty focusing/concentrating: no Sweating: no Dizziness/lightheadedness: no Palpitations: no  Carbonated/caffeinated beverages: no N/V/D/C/Gas: constipation (takes fiber for this)  Abdominal  pain: no Dumping syndrome: no   NUTRITION DIAGNOSIS  Overweight/obesity ([place]-3.3) related to past poor dietary habits and physical inactivity as evidenced by completed bariatric surgery and following dietary guidelines for continued weight loss and healthy nutrition status.   NUTRITION INTERVENTION Nutrition counseling (C-1) and education (E-2) to facilitate bariatric surgery goals, including: . Diet advancement to the next phase (phase 4) now including non-starchy vegetables  . The importance of consuming adequate calories as well as certain nutrients daily due to the body's need for essential vitamins, minerals, and fats . The importance of daily physical activity and to reach a goal of at least 150 minutes of moderate to vigorous physical activity weekly (or as directed by their physician) due to benefits such as increased musculature and improved lab values  Handouts Provided Include   Phase 4: Protein + Non-Starchy Vegetables   Learning Style & Readiness for Change Teaching method utilized: Visual & Auditory  Demonstrated degree of understanding via: Teach Back  Barriers to learning/adherence to lifestyle change: None Identified    MONITORING & EVALUATION Dietary intake, weekly physical activity, body weight, and goals in 4 months .  Next Steps Patient is to follow-up in 4 months for 6 month post-op follow-up.

## 2019-04-29 NOTE — Patient Instructions (Signed)
.   Continue to aim for a minimum of 64 fluid ounces daily with at least 32 ounces being plain water . Eat non-starchy vegetables 2 times a day 7 days a week . Start out with soft cooked vegetables today and tomorrow; if tolerated, begin to eat raw vegetables including salads . Per meal/snack, eat 3 ounces of protein first then non-starchy vegetables o Once you understand how much of your meal leads to satisfaction (not full) while still eating 3 ounces of protein and non-starchy vegetables, you can eat them in any order (figure out how much you can eat at a time to get enough but not too much)  . Continue to aim for 30 minutes of physical activity at least 5 times a week  

## 2019-05-01 DIAGNOSIS — G4733 Obstructive sleep apnea (adult) (pediatric): Secondary | ICD-10-CM | POA: Diagnosis not present

## 2019-05-07 ENCOUNTER — Telehealth: Payer: Self-pay | Admitting: *Deleted

## 2019-05-07 NOTE — Telephone Encounter (Signed)
   [facility] Pre-operative Risk Assessment    Request for surgical clearance:  1. What type of surgery is being performed? LEFT TOTAL HIP ARTHOPLASTY    2. When is this surgery scheduled? TBD   3. What type of clearance is required (medical clearance vs. Pharmacy clearance to hold med vs. Both)? MEDICAL  4. Are there any medications that need to be held prior to surgery and how long? EFFIENT   5. Practice name and name of physician performing surgery? [name]; DR. Jean Rosenthal   6. What is your office phone number [phone]    7.   What is your office fax number [phone] ATTN: SHERRI  8.   Anesthesia type (None, local, MAC, general) ? GENERAL vs SPINAL   Julaine Hua 05/07/2019, 4:31 PM  _________________________________________________________________   (provider comments below)

## 2019-05-08 NOTE — Telephone Encounter (Signed)
   Primary Cardiologist: Lauree Chandler, MD  Chart reviewed as part of pre-operative protocol coverage. Given past medical history and time since last visit, based on ACC/AHA guidelines, RILYN UPSHAW Sr. would be at acceptable risk for the planned procedure without further cardiovascular testing.   Pt underwent a procedure 02/2019 in which ASA and Effient were held prior to surgery with no issues. Spoke with patient today and he has no CV complaints. Walks daily and is able to perform greater then 4 METS without symptoms. He is at a 0.9% risk of major cardiac complication in the perioperative period. He may again hold ASA and Effient for 5-7 days as needed per surgical team then resume once ok from their standpoint.   I will route this recommendation to the requesting party via Epic fax function and remove from pre-op pool.  Please call with questions.  Kathyrn Drown, NP 05/08/2019, 11:55 AM

## 2019-05-12 ENCOUNTER — Other Ambulatory Visit: Payer: Self-pay | Admitting: Cardiovascular Disease

## 2019-05-12 ENCOUNTER — Other Ambulatory Visit: Payer: Self-pay | Admitting: Internal Medicine

## 2019-05-12 ENCOUNTER — Ambulatory Visit (HOSPITAL_COMMUNITY): Payer: Medicare HMO | Admitting: Psychiatry

## 2019-05-12 DIAGNOSIS — R6889 Other general symptoms and signs: Secondary | ICD-10-CM | POA: Diagnosis not present

## 2019-05-12 NOTE — Telephone Encounter (Signed)
Done erx 

## 2019-05-13 ENCOUNTER — Other Ambulatory Visit: Payer: Self-pay

## 2019-05-19 ENCOUNTER — Other Ambulatory Visit: Payer: Self-pay

## 2019-05-19 ENCOUNTER — Ambulatory Visit (INDEPENDENT_AMBULATORY_CARE_PROVIDER_SITE_OTHER): Payer: Medicare HMO | Admitting: Psychiatry

## 2019-05-19 DIAGNOSIS — F411 Generalized anxiety disorder: Secondary | ICD-10-CM

## 2019-05-19 DIAGNOSIS — F319 Bipolar disorder, unspecified: Secondary | ICD-10-CM | POA: Diagnosis not present

## 2019-05-19 MED ORDER — ARIPIPRAZOLE 5 MG PO TABS: 5.0000 mg | ORAL_TABLET | Freq: Every day | ORAL | 0 refills | Status: DC

## 2019-05-19 MED ORDER — BENZTROPINE MESYLATE 0.5 MG PO TABS: 0.5000 mg | ORAL_TABLET | Freq: Every day | ORAL | 0 refills | Status: DC

## 2019-05-19 MED ORDER — TRAZODONE HCL 150 MG PO TABS: 150.0000 mg | ORAL_TABLET | Freq: Every day | ORAL | 0 refills | Status: DC

## 2019-05-19 MED ORDER — DULOXETINE HCL 30 MG PO CPEP: 30.0000 mg | ORAL_CAPSULE | Freq: Two times a day (BID) | ORAL | 0 refills | Status: DC

## 2019-05-19 NOTE — Progress Notes (Signed)
Virtual Visit via Telephone Note  I connected with Travis Novak Sr. on 05/19/19 at  1:40 PM EST by telephone and verified that I am speaking with the correct person using two identifiers.   I discussed the limitations, risks, security and privacy concerns of performing an evaluation and management service by telephone and the availability of in person appointments. I also discussed with the patient that there may be a patient responsible charge related to this service. The patient expressed understanding and agreed to proceed.   History of Present Illness: Patient was evaluated by phone session.  He had a bariatric surgery and since then he has lost 30 pounds.  He feels the current medicine is working.  He denies any highs or lows or any severe mood swing.  He living with niece and her family members but like to move them with his girlfriend in February.  He is compliant with Abilify, trazodone, Cymbalta and Cogentin.  He is also getting Klonopin from PCP.  He is recovering much better from bariatric surgery and adjusting to his new lifestyle.  He has no tremors, shakes or any EPS.  Energy level is improved.  He denies drinking or using any illegal substances.  He feels the medicine helping his mood anxiety and anger.   Past Psychiatric History:Reviewed [facility] in [place] and [place]. H/Omood swings,suicidal gesture,anger,hallucination, irritability,impulse control and increase spending. Married 5 times. H/Oinpatient in 2014 due to overdose on Xanaxandalcohol.DidIOP. TookEffexor, Xanax, Klonopin, Ambien,Lunesta, Wellbutrin, Ambien, Remeronand Prozac. Abilifystarted on Abilify from this office.  Recent Results (from the past 2160 hour(s))  Glucose, capillary     Status: Abnormal   Collection Time: 02/27/19  2:03 PM  Result Value Ref Range   Glucose-Capillary 106 (H) 70 - 99 mg/dL  Novel Coronavirus, NAA (Hosp order, Send-out to Ref Lab; TAT 18-24 hrs     Status: None    Collection Time: 02/27/19  3:15 PM   Specimen: Nasopharyngeal Swab; Respiratory  Result Value Ref Range   SARS-CoV-2, NAA NOT DETECTED NOT DETECTED    Comment: (NOTE) This nucleic acid amplification test was developed and its performance characteristics determined by [employer]. Nucleic acid amplification tests include PCR and TMA. This test has not been FDA cleared or approved. This test has been authorized by FDA under an Emergency Use Authorization (EUA). This test is only authorized for the duration of time the declaration that circumstances exist justifying the authorization of the emergency use of in vitro diagnostic tests for detection of SARS-CoV-2 virus and/or diagnosis of COVID-19 infection under section 564(b)(1) of the Act, 21 U.S.C. 161WRU-0(A) (1), unless the authorization is terminated or revoked sooner. When diagnostic testing is negative, the possibility of a false negative result should be considered in the context of a patient's recent exposures and the presence of clinical signs and symptoms consistent with COVID-19. An individual without symptoms of COVID- 19 and who is not shedding SARS-CoV-2 vi rus would expect to have a negative (not detected) result in this assay. Performed At: [facility] [place], [place] [id number] Rush Farmer MD YN:[id number]    Coronavirus Source NASOPHARYNGEAL     Comment: Performed at [facility], [place] [address], [place] [address]  CBC     Status: Abnormal   Collection Time: 02/27/19  3:36 PM  Result Value Ref Range   WBC 5.5 4.0 - 10.5 K/uL   RBC 4.38 4.22 - 5.81 MIL/uL   Hemoglobin 12.0 (L) 13.0 - 17.0  g/dL   HCT 38.2 (L) 39.0 - 52.0 %   MCV 87.2 80.0 - 100.0 fL   MCH 27.4 26.0 - 34.0 pg   MCHC 31.4 30.0 - 36.0 g/dL   RDW 14.8 11.5 - 15.5 %   Platelets 250 150 - 400 K/uL   nRBC 0.0 0.0 - 0.2 %    Comment: Performed at [facility], [place] [address],  [place], [place] [address]  Comprehensive metabolic panel     Status: Abnormal   Collection Time: 02/27/19  3:36 PM  Result Value Ref Range   Sodium 139 135 - 145 mmol/L   Potassium 4.0 3.5 - 5.1 mmol/L   Chloride 104 98 - 111 mmol/L   CO2 25 22 - 32 mmol/L   Glucose, Bld 107 (H) 70 - 99 mg/dL   BUN 32 (H) 8 - 23 mg/dL   Creatinine, Ser 1.03 0.61 - 1.24 mg/dL   Calcium 9.0 8.9 - 10.3 mg/dL   Total Protein 7.3 6.5 - 8.1 g/dL   Albumin 4.5 3.5 - 5.0 g/dL   AST 20 15 - 41 U/L   ALT 15 0 - 44 U/L   Alkaline Phosphatase 67 38 - 126 U/L   Total Bilirubin 0.8 0.3 - 1.2 mg/dL   GFR calc non Af Amer >60 >60 mL/min   GFR calc Af Amer >60 >60 mL/min   Anion gap 10 5 - 15    Comment: Performed at [facility], [place] [address], [place], [place] [address]  Glucose, capillary     Status: Abnormal   Collection Time: 03/03/19  7:39 AM  Result Value Ref Range   Glucose-Capillary 133 (H) 70 - 99 mg/dL   Comment 1 Notify RN    Comment 2 Document in Chart   Type and screen     Status: None   Collection Time: 03/03/19  8:00 AM  Result Value Ref Range   ABO/RH(D) A POS    Antibody Screen NEG    Sample Expiration      03/06/2019,2359 Performed at [facility], [place] [address] [place], [place] [address]   Surgical pathology     Status: None   Collection Time: 03/03/19 10:13 AM  Result Value Ref Range   SURGICAL PATHOLOGY      SURGICAL PATHOLOGY CASE: [id number] PATIENT: Travis [name] Surgical Pathology Report     Clinical History: Morbid obesity, HTN, DM II, CHF, GERD, OSA, Right BKA     DIAGNOSIS:  A. STOMACH, RESECTION:  NO GROSS PATHOLOGIC FINDINGS.   GROSS DESCRIPTION:  Received fresh is a 23 x 4.4 x 2.8 cm portion of stomach, clinically greater curvature, with a pink to hyperemic smooth serosa, and a staple line along the entire length of the specimen on one aspect.  There is minimal attached soft fatty tissue.  On opening, the stomach  contains a small amount of dark red soft mucoid material.  The mucosa is tan-pink to hyperemic, smooth and soft, with normal gastric folds.  No discrete lesions or masses are identified.  No sections submitted.  Gross diagnosis only.    Final Diagnosis performed by Gillie Manners, MD.   Electronically signed 03/04/2019 Technical component performed at [facility], [place] [address] [place], [place] [address].   Professional component performed at [employer]. [facility], [place] [address], [place],  [address].  Immunohistochemistry Technical component (if applicable) was performed at [facility]. [address], [place],  [place] [address].   IMMUNOHISTOCHEMISTRY DISCLAIMER (if applicable): Some of these immunohistochemical stains may have been developed and the performance characteristics determine by [facility]. Some may not have been cleared or approved by the U.S. Food and Drug Administration. The FDA has determined that such clearance or approval is not necessary. This test is used for clinical purposes. It should not be regarded as investigational or for research. This laboratory is certified under the [place] (CLIA-88) as qualified to perform high complexity clinical laboratory testing.  The controls stained appropriately.   Glucose, capillary     Status: Abnormal   Collection Time: 03/03/19 11:26 AM  Result Value Ref Range   Glucose-Capillary 174 (H) 70 - 99 mg/dL  Hemoglobin and hematocrit, blood     Status: None   Collection Time: 03/03/19 12:12 PM  Result Value Ref Range   Hemoglobin 13.3 13.0 - 17.0 g/dL   HCT 43.0 39.0 - 52.0 %    Comment: Performed at [facility], [place] [address], [place], [place] [address]  Glucose, capillary     Status: Abnormal   Collection Time: 03/03/19  3:51 PM  Result Value Ref Range   Glucose-Capillary 152  (H) 70 - 99 mg/dL  Glucose, capillary     Status: Abnormal   Collection Time: 03/03/19  8:19 PM  Result Value Ref Range   Glucose-Capillary 145 (H) 70 - 99 mg/dL  Glucose, capillary     Status: Abnormal   Collection Time: 03/04/19 12:16 AM  Result Value Ref Range   Glucose-Capillary 127 (H) 70 - 99 mg/dL  Glucose, capillary     Status: None   Collection Time: 03/04/19  4:07 AM  Result Value Ref Range   Glucose-Capillary 99 70 - 99 mg/dL  CBC WITH DIFFERENTIAL     Status: Abnormal   Collection Time: 03/04/19  4:20 AM  Result Value Ref Range   WBC 8.7 4.0 - 10.5 K/uL   RBC 4.30 4.22 - 5.81 MIL/uL   Hemoglobin 11.9 (L) 13.0 - 17.0 g/dL   HCT 38.2 (L) 39.0 - 52.0 %   MCV 88.8 80.0 - 100.0 fL   MCH 27.7 26.0 - 34.0 pg   MCHC 31.2 30.0 - 36.0 g/dL   RDW 14.5 11.5 - 15.5 %   Platelets 242 150 - 400 K/uL   nRBC 0.0 0.0 - 0.2 %   Neutrophils Relative % 80 %   Neutro Abs 6.9 1.7 - 7.7 K/uL   Lymphocytes Relative 10 %   Lymphs Abs 0.9 0.7 - 4.0 K/uL   Monocytes Relative 10 %   Monocytes Absolute 0.8 0.1 - 1.0 K/uL   Eosinophils Relative 0 %   Eosinophils Absolute 0.0 0.0 - 0.5 K/uL   Basophils Relative 0 %   Basophils Absolute 0.0 0.0 - 0.1 K/uL   Immature Granulocytes 0 %   Abs Immature Granulocytes 0.03 0.00 - 0.07 K/uL    Comment: Performed at [facility], [place] [address], [place], [place] [address]  Comprehensive metabolic panel     Status: Abnormal   Collection Time: 03/04/19  4:20 AM  Result Value Ref Range   Sodium 136 135 - 145 mmol/L   Potassium 4.4 3.5 - 5.1 mmol/L   Chloride 102 98 - 111 mmol/L   CO2 26 22 - 32 mmol/L   Glucose, Bld 115 (H) 70 - 99 mg/dL   BUN 18 8 - 23 mg/dL   Creatinine, Ser 0.96 0.61 -  1.24 mg/dL   Calcium 8.8 (L) 8.9 - 10.3 mg/dL   Total Protein 6.6 6.5 - 8.1 g/dL   Albumin 3.8 3.5 - 5.0 g/dL   AST 28 15 - 41 U/L   ALT 19 0 - 44 U/L   Alkaline Phosphatase 58 38 - 126 U/L   Total Bilirubin 0.9 0.3 - 1.2 mg/dL   GFR calc  non Af Amer >60 >60 mL/min   GFR calc Af Amer >60 >60 mL/min   Anion gap 8 5 - 15    Comment: Performed at [facility], [place] [address], [place], [place] [address]  Glucose, capillary     Status: Abnormal   Collection Time: 03/04/19  7:47 AM  Result Value Ref Range   Glucose-Capillary 117 (H) 70 - 99 mg/dL  CBC     Status: Abnormal   Collection Time: 03/04/19 11:48 AM  Result Value Ref Range   WBC 7.8 4.0 - 10.5 K/uL   RBC 4.22 4.22 - 5.81 MIL/uL   Hemoglobin 12.0 (L) 13.0 - 17.0 g/dL   HCT 35.3 (L) 39.0 - 52.0 %   MCV 83.6 80.0 - 100.0 fL   MCH 28.4 26.0 - 34.0 pg   MCHC 34.0 30.0 - 36.0 g/dL   RDW 14.6 11.5 - 15.5 %   Platelets 229 150 - 400 K/uL   nRBC 0.0 0.0 - 0.2 %    Comment: Performed at [facility], [place] [address], [place], [place] [address]  Glucose, capillary     Status: Abnormal   Collection Time: 03/04/19 12:05 PM  Result Value Ref Range   Glucose-Capillary 110 (H) 70 - 99 mg/dL  Glucose, capillary     Status: Abnormal   Collection Time: 03/04/19  3:40 PM  Result Value Ref Range   Glucose-Capillary 112 (H) 70 - 99 mg/dL  Glucose, capillary     Status: Abnormal   Collection Time: 03/04/19  7:55 PM  Result Value Ref Range   Glucose-Capillary 107 (H) 70 - 99 mg/dL  Glucose, capillary     Status: Abnormal   Collection Time: 03/04/19 11:40 PM  Result Value Ref Range   Glucose-Capillary 127 (H) 70 - 99 mg/dL  Glucose, capillary     Status: Abnormal   Collection Time: 03/05/19  3:40 AM  Result Value Ref Range   Glucose-Capillary 107 (H) 70 - 99 mg/dL  CBC with Differential     Status: Abnormal   Collection Time: 03/05/19  4:09 AM  Result Value Ref Range   WBC 6.9 4.0 - 10.5 K/uL   RBC 4.20 (L) 4.22 - 5.81 MIL/uL   Hemoglobin 11.7 (L) 13.0 - 17.0 g/dL   HCT 36.9 (L) 39.0 - 52.0 %   MCV 87.9 80.0 - 100.0 fL   MCH 27.9 26.0 - 34.0 pg   MCHC 31.7 30.0 - 36.0 g/dL   RDW 14.6 11.5 - 15.5 %   Platelets 227 150 - 400 K/uL    nRBC 0.0 0.0 - 0.2 %   Neutrophils Relative % 70 %   Neutro Abs 4.7 1.7 - 7.7 K/uL   Lymphocytes Relative 19 %   Lymphs Abs 1.3 0.7 - 4.0 K/uL   Monocytes Relative 11 %   Monocytes Absolute 0.7 0.1 - 1.0 K/uL   Eosinophils Relative 0 %   Eosinophils Absolute 0.0 0.0 - 0.5 K/uL   Basophils Relative 0 %   Basophils Absolute 0.0 0.0 - 0.1 K/uL   Immature Granulocytes 0 %  Abs Immature Granulocytes 0.03 0.00 - 0.07 K/uL    Comment: Performed at [facility], [place] [address] [place], [place] [address]  Glucose, capillary     Status: Abnormal   Collection Time: 03/05/19  7:49 AM  Result Value Ref Range   Glucose-Capillary 114 (H) 70 - 99 mg/dL     Psychiatric Specialty Exam: Physical Exam  Review of Systems  There were no vitals taken for this visit.There is no height or weight on file to calculate BMI.  General Appearance: NA  Eye Contact:  NA  Speech:  Clear and Coherent  Volume:  Normal  Mood:  Euthymic  Affect:  NA  Thought Process:  Goal Directed  Orientation:  Full (Time, Place, and Person)  Thought Content:  WDL  Suicidal Thoughts:  No  Homicidal Thoughts:  No  Memory:  Immediate;   Good Recent;   Good Remote;   Good  Judgement:  Good  Insight:  Good  Psychomotor Activity:  NA  Concentration:  Concentration: Fair and Attention Span: Fair  Recall:  Good  Fund of Knowledge:  Good  Language:  Good  Akathisia:  No  Handed:  Right  AIMS (if indicated):     Assets:  Communication Skills Desire for Improvement Housing Resilience Social Support  ADL's:  Intact  Cognition:  WNL  Sleep:   good      Assessment and Plan: Bipolar disorder type I.  Generalized anxiety disorder.  I reviewed his current medication, recent blood work results.  He is stable on his current medication.  Continue Cymbalta 30 mg twice a day, Cogentin 0.5 mg at bedtime, Abilify 5 mg daily and trazodone 150 mg at bedtime.  His Klonopin is given by his PCP.  Discussed  medication side effects and benefits.  Recommended to call us back if there is any question or any concern.  Follow-up in 3 months.  Follow Up Instructions:    I discussed the assessment and treatment plan with the patient. The patient was provided an opportunity to ask questions and all were answered. The patient agreed with the plan and demonstrated an understanding of the instructions.   The patient was advised to call back or seek an in-person evaluation if the symptoms worsen or if the condition fails to improve as anticipated.  I provided 20 minutes of non-face-to-face time during this encounter.   Kathlee Nations, MD

## 2019-05-21 DIAGNOSIS — Z89511 Acquired absence of right leg below knee: Secondary | ICD-10-CM | POA: Diagnosis not present

## 2019-05-21 DIAGNOSIS — R6889 Other general symptoms and signs: Secondary | ICD-10-CM | POA: Diagnosis not present

## 2019-05-24 HISTORY — PX: TOTAL HIP REVISION: SHX763

## 2019-05-27 ENCOUNTER — Other Ambulatory Visit: Payer: Self-pay | Admitting: Physician Assistant

## 2019-05-27 DIAGNOSIS — R6889 Other general symptoms and signs: Secondary | ICD-10-CM | POA: Diagnosis not present

## 2019-06-01 DIAGNOSIS — G4733 Obstructive sleep apnea (adult) (pediatric): Secondary | ICD-10-CM | POA: Diagnosis not present

## 2019-06-02 NOTE — Progress Notes (Signed)
[place], [place] [address] Phone: [phone] Fax: [phone]      Your procedure is scheduled on Friday 06/06/2019.  Report to [facility] Main Entrance "A" at 10:15 A.M., and check in at the Admitting office.  Call this number if you have problems the morning of surgery:  [phone]  Call [phone] if you have any questions prior to your surgery date Monday-Friday 8am-4pm    Remember:  Do not eat after midnight the night before your surgery  You may drink clear liquids until 09:15am the morning of your surgery.   Clear liquids allowed are: Water, Non-Citrus Juices (without pulp), Carbonated Beverages, Clear Tea, Black Coffee Only, and Gatorade   Enhanced Recovery after Surgery for Orthopedics Enhanced Recovery after Surgery is a protocol used to improve the stress on your body and your recovery after surgery.  Patient Instructions  . The night before surgery:  o No food after midnight. ONLY clear liquids after midnight until your designated stop time as mentioned above.   . The day of surgery (if you have diabetes): o   o Drink ONE (1) 10 FL OZ Bottled Water as directed. o This drink was given to you during your hospital  pre-op appointment visit.  o The pre-op nurse will instruct you on the time to drink the  Bottled Water depending on your surgery time. - complete your Bottled Water by 09:15am the morning of your surgery o Nothing else to drink after completing the [employer].         If you have questions, please contact your surgeon's office.     Take these medicines the morning of surgery with A SIP OF WATER: Duloxetine (Cymbalta) Gabapentin (Neurontin) Hydrocodone-acetaminophen (Norco) - if needed for pain Methocarbamol (Robaxin) - if needed for muscle spasms Pantoprazole (Protonix)  7 days prior to surgery STOP taking any Prasugrel (Effient), Celecoxib (Celebrex), Diclofenac  sodium (Voltaren) gel, Aspirin (unless otherwise instructed by your surgeon), Aleve, Naproxen, Ibuprofen, Motrin, Advil, Goody's, BC's, all herbal medications, fish oil, and all vitamins.    HOW TO MANAGE YOUR DIABETES BEFORE AND AFTER SURGERY  Why is it important to control my blood sugar before and after surgery? . Improving blood sugar levels before and after surgery helps healing and can limit problems. . A way of improving blood sugar control is eating a healthy diet by: o  Eating less sugar and carbohydrates o  Increasing activity/exercise o  Talking with your doctor about reaching your blood sugar goals . High blood sugars (greater than 180 mg/dL) can raise your risk of infections and slow your recovery, so you will need to focus on controlling your diabetes during the weeks before surgery. . Make sure that the doctor who takes care of your diabetes knows about your planned surgery including the date and location.  How do I manage my blood sugar before surgery? . Check your blood sugar at least 4 times a day, starting 2 days before surgery, to make sure that the level is not too high or low. . Check your blood sugar the morning of your surgery when you wake up and every 2 hours until you get to the Short Stay unit. o If your blood sugar is less than 70 mg/dL, you will need to treat for low blood sugar: - Do not take insulin. - Treat a low blood sugar (less than 70 mg/dL) with  cup of clear juice (cranberry  or apple), 4 glucose tablets, OR glucose gel. - Recheck blood sugar in 15 minutes after treatment (to make sure it is greater than 70 mg/dL). If your blood sugar is not greater than 70 mg/dL on recheck, call [phone] for further instructions. . Report your blood sugar to the short stay nurse when you get to Short Stay.  . If you are admitted to the hospital after surgery: o Your blood sugar will be checked by the staff and you will probably be given insulin after surgery  (instead of oral diabetes medicines) to make sure you have good blood sugar levels. o The goal for blood sugar control after surgery is 80-180 mg/dL.     The Morning of Surgery  Do not wear jewelry.  Do not wear lotions, powders, colognes, or deodorant  Men may shave face and neck.  Do not bring valuables to the hospital.  [facility] is not responsible for any belongings or valuables.  If you are a smoker, DO NOT Smoke 24 hours prior to surgery  If you wear a CPAP at night please bring your mask, tubing, and machine the morning of surgery   Remember that you must have someone to transport you home after your surgery, and remain with you for 24 hours if you are discharged the same day.   Please bring cases for contacts, glasses, hearing aids, dentures or bridgework because it cannot be worn into surgery.    Leave your suitcase in the car.  After surgery it may be brought to your room.  For patients admitted to the hospital, discharge time will be determined by your treatment team.  Patients discharged the day of surgery will not be allowed to drive home.    Special instructions:   [place]- Preparing For Surgery  Before surgery, you can play an important role. Because skin is not sterile, your skin needs to be as free of germs as possible. You can reduce the number of germs on your skin by washing with CHG (chlorahexidine gluconate) Soap before surgery.  CHG is an antiseptic cleaner which kills germs and bonds with the skin to continue killing germs even after washing.    Oral Hygiene is also important to reduce your risk of infection.  Remember - BRUSH YOUR TEETH THE MORNING OF SURGERY WITH YOUR REGULAR TOOTHPASTE  Please do not use if you have an allergy to CHG or antibacterial soaps. If your skin becomes reddened/irritated stop using the CHG.  Do not shave (including legs and underarms) for at least 48 hours prior to first CHG shower. It is OK to shave your face.  Please  follow these instructions carefully.   1. Shower the NIGHT BEFORE SURGERY and the MORNING OF SURGERY with CHG Soap.   2. If you chose to wash your hair, wash your hair first as usual with your normal shampoo.  3. After you shampoo, rinse your hair and body thoroughly to remove the shampoo.  4. Use CHG as you would any other liquid soap. You can apply CHG directly to the skin and wash gently with a scrungie or a clean washcloth.   5. Apply the CHG Soap to your body ONLY FROM THE NECK DOWN.  Do not use on open wounds or open sores. Avoid contact with your eyes, ears, mouth and genitals (private parts). Wash Face and genitals (private parts)  with your normal soap.   6. Wash thoroughly, paying special attention to the area where your surgery will be performed.  7. Thoroughly rinse your body with warm water from the neck down.  8. DO NOT shower/wash with your normal soap after using and rinsing off the CHG Soap.  9. Pat yourself dry with a CLEAN TOWEL.  10. Wear CLEAN PAJAMAS to bed the night before surgery, wear comfortable clothes the morning of surgery  11. Place CLEAN SHEETS on your bed the night of your first shower and DO NOT SLEEP WITH PETS.    Day of Surgery:  Please shower the morning of surgery with the CHG soap Do not apply any deodorants/lotions. Please wear clean clothes to the hospital/surgery center.   Remember to brush your teeth WITH YOUR REGULAR TOOTHPASTE.   Please read over the following fact sheets that you were given.

## 2019-06-03 ENCOUNTER — Other Ambulatory Visit: Payer: Self-pay

## 2019-06-03 ENCOUNTER — Other Ambulatory Visit (HOSPITAL_COMMUNITY)
Admission: RE | Admit: 2019-06-03 | Discharge: 2019-06-03 | Disposition: A | Discharge status: Home / Self Care | Payer: Medicare HMO | Source: Ambulatory Visit | Attending: Orthopaedic Surgery | Admitting: Orthopaedic Surgery

## 2019-06-03 ENCOUNTER — Encounter (HOSPITAL_COMMUNITY): Payer: Self-pay

## 2019-06-03 ENCOUNTER — Encounter (HOSPITAL_COMMUNITY)
Admission: RE | Admit: 2019-06-03 | Discharge: 2019-06-03 | Disposition: A | Discharge status: Home / Self Care | Payer: Medicare HMO | Source: Ambulatory Visit | Attending: Orthopaedic Surgery | Admitting: Orthopaedic Surgery

## 2019-06-03 DIAGNOSIS — Z9884 Bariatric surgery status: Secondary | ICD-10-CM | POA: Insufficient documentation

## 2019-06-03 DIAGNOSIS — R6889 Other general symptoms and signs: Secondary | ICD-10-CM | POA: Diagnosis not present

## 2019-06-03 DIAGNOSIS — I10 Essential (primary) hypertension: Secondary | ICD-10-CM | POA: Insufficient documentation

## 2019-06-03 DIAGNOSIS — Z955 Presence of coronary angioplasty implant and graft: Secondary | ICD-10-CM | POA: Insufficient documentation

## 2019-06-03 DIAGNOSIS — I2510 Atherosclerotic heart disease of native coronary artery without angina pectoris: Secondary | ICD-10-CM | POA: Diagnosis not present

## 2019-06-03 DIAGNOSIS — Z01812 Encounter for preprocedural laboratory examination: Secondary | ICD-10-CM | POA: Insufficient documentation

## 2019-06-03 DIAGNOSIS — E785 Hyperlipidemia, unspecified: Secondary | ICD-10-CM | POA: Insufficient documentation

## 2019-06-03 LAB — BASIC METABOLIC PANEL
Anion gap: 10 (ref 5–15)
BUN: 30 mg/dL — ABNORMAL HIGH (ref 8–23)
CO2: 24 mmol/L (ref 22–32)
Calcium: 9.1 mg/dL (ref 8.9–10.3)
Chloride: 103 mmol/L (ref 98–111)
Creatinine, Ser: 0.94 mg/dL (ref 0.61–1.24)
GFR calc Af Amer: >60 mL/min (ref >60)
GFR calc non Af Amer: >60 mL/min (ref >60)
Glucose, Bld: 114 mg/dL — ABNORMAL HIGH (ref 70–99)
Potassium: 3.6 mmol/L (ref 3.5–5.1)
Sodium: 137 mmol/L (ref 135–145)

## 2019-06-03 LAB — SURGICAL PCR SCREEN
MRSA, PCR: NEGATIVE
Staphylococcus aureus: NEGATIVE

## 2019-06-03 LAB — CBC
HCT: 42.3 % (ref 39.0–52.0)
Hemoglobin: 13.8 g/dL (ref 13.0–17.0)
MCH: 29.4 pg (ref 26.0–34.0)
MCHC: 32.6 g/dL (ref 30.0–36.0)
MCV: 90.2 fL (ref 80.0–100.0)
Platelets: 285 10*3/uL (ref 150–400)
RBC: 4.69 MIL/uL (ref 4.22–5.81)
RDW: 14.2 % (ref 11.5–15.5)
WBC: 6.6 10*3/uL (ref 4.0–10.5)
nRBC: 0 % (ref 0.0–0.2)

## 2019-06-03 LAB — PROTIME-INR
INR: 1.1 (ref 0.8–1.2)
Prothrombin Time: 13.6 seconds (ref 11.4–15.2)

## 2019-06-03 NOTE — Progress Notes (Signed)
PCP: [name], MD Cardiologist:  Dr. Angelena Form  EKG:  07/2018 CXR:  N/A ECHO:  01/09/18 Stress Test:  03/26/18 Cardiac Cath:  approx 5 years ago  Pt denies diabetes.  Does not check glucose levels.  Anesthesia Review:  Yes, caridac history.  9 cardiac stents, on Effient.  Last dose Effient 05/31/19  covid test 06/03/19  Patient denies shortness of breath, fever, cough, and chest pain at PAT appointment.  Patient verbalized understanding of instructions provided today at the PAT appointment.  Patient asked to review instructions at home and day of surgery.

## 2019-06-04 ENCOUNTER — Telehealth: Payer: Self-pay | Admitting: *Deleted

## 2019-06-04 ENCOUNTER — Telehealth (INDEPENDENT_AMBULATORY_CARE_PROVIDER_SITE_OTHER): Payer: Medicare HMO | Admitting: Physician Assistant

## 2019-06-04 ENCOUNTER — Encounter: Payer: Self-pay | Admitting: Physician Assistant

## 2019-06-04 VITALS — BP 139/60 | Ht 72.0 in | Wt 300.0 lb

## 2019-06-04 DIAGNOSIS — I2510 Atherosclerotic heart disease of native coronary artery without angina pectoris: Secondary | ICD-10-CM

## 2019-06-04 DIAGNOSIS — I10 Essential (primary) hypertension: Secondary | ICD-10-CM

## 2019-06-04 DIAGNOSIS — E785 Hyperlipidemia, unspecified: Secondary | ICD-10-CM

## 2019-06-04 DIAGNOSIS — R6889 Other general symptoms and signs: Secondary | ICD-10-CM | POA: Diagnosis not present

## 2019-06-04 LAB — NOVEL CORONAVIRUS, NAA (HOSP ORDER, SEND-OUT TO REF LAB; TAT 18-24 HRS): SARS-CoV-2, NAA: NOT DETECTED

## 2019-06-04 NOTE — Progress Notes (Signed)
Virtual Visit via Video Note   This visit type was conducted due to national recommendations for restrictions regarding the COVID-19 Pandemic (e.g. social distancing) in an effort to limit this patient's exposure and mitigate transmission in our community.  Due to his co-morbid illnesses, this patient is at least at moderate risk for complications without adequate follow up.  This format is felt to be most appropriate for this patient at this time.  All issues noted in this document were discussed and addressed.  A limited physical exam was performed with this format.  Please refer to the patient's chart for his consent to telehealth for [facility].   Date:  06/04/2019   ID:  Travis Novak Sr., DOB 09-29-1950, MRN [id number]  Patient Location: Home Provider Location: Office  PCP:  [name], MD  Cardiologist:  Lauree Chandler, MD   Electrophysiologist:  None   Evaluation Performed:  Follow-Up Visit  Chief Complaint:  CAD  History of Present Illness:    Travis Coupe. is a 69 y.o. male with   Coronary artery disease  s/p multiple PCI's  Taxus DES to the LAD in 2008, Endeavor DES to the LCx in 2008, BMS to the RCA in 2009, Xience DES to the LCx in 2009  Poor Plt inhibition w Plavix >> chronic DAPT 2/2 multiple stents (prasugrel, aspirin)  Cath in 2014: Patent stents  Myoview in November 2019: No ischemia  Chronic diastolic CHF  Diabetes mellitus  Hypertension  Hyperlipidemia  GERD  Hepatitis C  Depression/anxiety  Chronic pain  Fibromyalgia  He was last seen by Travis Jester, PA-C via telemedicine in April 2020.  Today, he notes he is doing well.  He walks 3 x a day approx 30 min each time. He has not had chest pain or significant shortness of breath.  He has not had syncope, orthopnea, leg swelling.     Past Medical History:  Diagnosis Date  . ALLERGIC RHINITIS 06/24/2009  . Allergy   . Anxiety 11/12/2011   Adequate for  discharge   . Arthritis    "all my joints" (09/30/2013)  . Arthritis of foot, right, degenerative 04/15/2014  . Balance disorder 03/12/2013  . Benign neoplasm of cecum   . Benign neoplasm of descending colon   . CAD (coronary artery disease) 06/24/2009   5 stents placed in 2007    . Chronic anticoagulation   . Chronic pain syndrome 10/27/2009   of ankle, shoulders, low back.  sciatica.   . Closed fracture of right foot 10/17/2014  . CORONARY ARTERY DISEASE 06/24/2009   a. s/p multiple PCIs - In 2008 he had a Taxus DES to the mild LAD, Endeavor DES to mid LCX and distal LCX. In January 2009 he had DES to distal LCX, mid LCX and proximal LCX. In November 2009 had BMS x 2 to the mid RCA. Cath 10/2011 with patent stents, noncardiac CP. LHC 01/2013: patent stents (noncardiac CP).  . DEGENERATIVE JOINT DISEASE 06/24/2009   Qualifier: Diagnosis of  By: [name], [name]   . Depression   . Depression with anxiety    Prior suicide attempt  . [place] DISEASE, LUMBAR 04/19/2010  . ERECTILE DYSFUNCTION, ORGANIC 05/30/2010  . Essential hypertension 06/24/2009   Qualifier: Diagnosis of  By: [name], [name]   . Fibromyalgia   . Fracture dislocation of ankle joint 09/02/2015  . Gait disorder 03/12/2013  . General weakness 07/14/2014  . GERD (gastroesophageal reflux disease) 09/08/2015  .  Hand joint pain 06/10/2013  . Heart murmur   . Hepatitis C    treated pt. unknown with what he was a teenager  . History of kidney stones   . Hyperlipidemia 07/15/2009   Qualifier: Diagnosis of  By: Aundra Dubin, MD, [place]    . HYPERLIPIDEMIA-MIXED 07/15/2009  . HYPERTENSION 06/24/2009  . Insomnia 10/04/2011  . Irregular heart beat   . Left hip pain 03/12/2013   Injected under ultrasound guidance on June 24, 2013   . Major depression 09/13/2015  . Myocardial infarction ([place]) 2008  . Non-cardiac chest pain 10/2011, 01/2013  . Obesity   . Occult blood positive stool 10/17/2014  . Open ankle fracture 09/02/2015  . OSA (obstructive sleep  apnea)    not using CPAP (09/30/2013)  . Pain of right thumb 04/03/2013  . Pneumonia   . PPD positive 04/08/2015  . Pre-ulcerative corn or callous 02/06/2013  . Rotator cuff tear arthropathy of both shoulders 06/10/2013   History of bilateral shoulder cuff surgery for rotator cuff tears. Reports increase in pain 09/11/2015 during physical therapy of the left shoulder.   [name] SCIATICA, LEFT 04/19/2010   Qualifier: Diagnosis of  By: [name], [name]   . Sleep apnea    wears cpap   . Spinal stenosis in cervical region 09/26/2013  . Spinal stenosis, lumbar region, with neurogenic claudication 09/26/2013  . Type II diabetes mellitus ([place]) 2012   no meds in 09/2014.   [name] Uncontrolled type 2 DM with peripheral circulatory disorder ([place]) 10/04/2013  . URETHRAL STRICTURE 06/24/2009   self catheterizes.    Past Surgical History:  Procedure Laterality Date  . AMPUTATION Right 06/14/2016   Procedure: AMPUTATION BELOW KNEE;  Surgeon: Newt Minion, MD;  Location: [place];  Service: Orthopedics;  Laterality: Right;  . ANKLE FUSION Right 04/15/2014   Procedure: Right Subtalar, Talonavicular Fusion;  Surgeon: Newt Minion, MD;  Location: [place];  Service: Orthopedics;  Laterality: Right;  . ANKLE FUSION Right 04/18/2016   Procedure: Right Ankle Tibiocalcaneal Fusion;  Surgeon: Newt Minion, MD;  Location: [place];  Service: Orthopedics;  Laterality: Right;  . ANTERIOR CERVICAL DECOMP/DISCECTOMY FUSION N/A 09/26/2013   Procedure: ANTERIOR CERVICAL DISCECTOMY FUSION C3-4, plate and screw fixation, allograft bone graft;  Surgeon: Jessy Oto, MD;  Location: [place];  Service: Orthopedics;  Laterality: N/A;  . BACK SURGERY    . BELOW KNEE LEG AMPUTATION Right 06/14/2016   right ankle and foot  . CARDIAC CATHETERIZATION  X 1  . CARPAL TUNNEL RELEASE Bilateral   . COLONOSCOPY    . COLONOSCOPY N/A 10/22/2014   Procedure: COLONOSCOPY;  Surgeon: [place] Dragon, MD;  Location: [facility] ENDOSCOPY;  Service: Endoscopy;  Laterality: N/A;   . CORONARY ANGIOPLASTY WITH STENT PLACEMENT     "I have 9 stents"  . ESOPHAGOGASTRODUODENOSCOPY N/A 10/19/2014   Procedure: ESOPHAGOGASTRODUODENOSCOPY (EGD);  Surgeon: Jerene Bears, MD;  Location: [facility] ENDOSCOPY;  Service: Endoscopy;  Laterality: N/A;  . FRACTURE SURGERY    . FUSION OF TALONAVICULAR JOINT Right 04/15/2014   dr duda  . HERNIA REPAIR     umbilical  . INGUINAL HERNIA REPAIR Right 05/11/2015   Procedure: LAPAROSCOPIC REPAIR RIGHT  INGUINAL HERNIA;  Surgeon: Greer Pickerel, MD;  Location: [place];  Service: General;  Laterality: Right;  . INSERTION OF MESH Right 05/11/2015   Procedure: INSERTION OF MESH;  Surgeon: Greer Pickerel, MD;  Location: [place];  Service: General;  Laterality: Right;  . JOINT  REPLACEMENT    . KNEE CARTILAGE SURGERY Right X 12   "~ 1/2 open; ~ 1/2 scopes"  . KNEE CARTILAGE SURGERY Left X 3   "3 scopes"  . LAPAROSCOPIC GASTRIC SLEEVE RESECTION N/A 03/03/2019   Procedure: LAPAROSCOPIC GASTRIC SLEEVE RESECTION, Upper Endo, ERAS Pathway;  Surgeon: Greer Pickerel, MD;  Location: [facility];  Service: General;  Laterality: N/A;  . LEFT HEART CATHETERIZATION WITH CORONARY ANGIOGRAM N/A 02/10/2013   Procedure: LEFT HEART CATHETERIZATION WITH CORONARY ANGIOGRAM;  Surgeon: Burnell Blanks, MD;  Location: [facility] CATH LAB;  Service: Cardiovascular;  Laterality: N/A;  . LUMBAR LAMINECTOMY N/A 07/16/2018   Procedure: LEFT L4-5 REDO PARTIAL LUMBAR HEMILAMINECTOMY WITH FORAMINOTOMY LEFT L4;  Surgeon: Jessy Oto, MD;  Location: [place];  Service: Orthopedics;  Laterality: N/A;  . LUMBAR LAMINECTOMY/DECOMPRESSION MICRODISCECTOMY N/A 01/27/2014   Procedure: CENTRAL LUMBAR LAMINECTOMY L4-5 AND L3-4;  Surgeon: Jessy Oto, MD;  Location: [place];  Service: Orthopedics;  Laterality: N/A;  . ORIF ANKLE FRACTURE Right 09/02/2015   Procedure: OPEN REDUCTION INTERNAL FIXATION (ORIF) ANKLE FRACTURE;  Surgeon: Newt Minion, MD;  Location: [place];  Service: Orthopedics;  Laterality: Right;  .  PERIPHERALLY INSERTED CENTRAL CATHETER INSERTION  09/02/2015  . POLYPECTOMY    . SHOULDER ARTHROSCOPY W/ ROTATOR CUFF REPAIR Bilateral    "3 on the right; 1 on the left"  . SKIN SPLIT GRAFT Right 10/01/2015   Procedure: RIGHT ANKLE APPLY SKIN GRAFT SPLIT THICKNESS;  Surgeon: Newt Minion, MD;  Location: [place];  Service: Orthopedics;  Laterality: Right;  . TONSILLECTOMY    . TOOTH EXTRACTION    . TOTAL HIP ARTHROPLASTY Right 04/17/2018  . TOTAL HIP ARTHROPLASTY Right 04/17/2018   Procedure: RIGHT TOTAL HIP ARTHROPLASTY;  Surgeon: Newt Minion, MD;  Location: [place];  Service: Orthopedics;  Laterality: Right;  . TOTAL KNEE ARTHROPLASTY Bilateral 2008  . UMBILICAL HERNIA REPAIR     UHR  . UPPER GASTROINTESTINAL ENDOSCOPY    . URETHRAL DILATION  X 4  . VASECTOMY    . WISDOM TOOTH EXTRACTION       Current Meds  Medication Sig  . ARIPiprazole (ABILIFY) 5 MG tablet Take 1 tablet (5 mg total) by mouth at bedtime.  [name] aspirin EC 81 MG tablet Take 81 mg by mouth at bedtime.  [name] atorvastatin (LIPITOR) 20 MG tablet Take 20 mg by mouth daily.  . benztropine (COGENTIN) 0.5 MG tablet Take 1 tablet (0.5 mg total) by mouth at bedtime.  . calcium carbonate (TUMS - DOSED IN MG ELEMENTAL CALCIUM) 500 MG chewable tablet Chew 1,000 mg by mouth at bedtime.  . Calcium Polycarbophil (FIBER-CAPS PO) Take 3 capsules by mouth at bedtime.  . celecoxib (CELEBREX) 200 MG capsule Take 200 mg by mouth 2 (two) times daily.  . clonazePAM (KLONOPIN) 0.5 MG tablet Take 1 tablet (0.5 mg total) by mouth at bedtime.  . cyclobenzaprine (FLEXERIL) 5 MG tablet Take 5 mg by mouth at bedtime.  . diclofenac sodium (VOLTAREN) 1 % GEL Apply 4 g topically 3 (three) times daily as needed (pain).  . DULoxetine (CYMBALTA) 30 MG capsule Take 1 capsule (30 mg total) by mouth 2 (two) times daily.  [name] gabapentin (NEURONTIN) 800 MG tablet Take 800 mg by mouth 3 (three) times daily.   . hydrochlorothiazide (HYDRODIURIL) 25 MG tablet Take 25  mg by mouth at bedtime.  [name] HYDROcodone-acetaminophen (NORCO) 10-325 MG tablet Take 1 tablet by mouth every 8 (eight) hours as needed  for pain.  . Melatonin 5 MG TABS Take 20 mg by mouth at bedtime.   . methocarbamol (ROBAXIN) 500 MG tablet Take 1 tablet (500 mg total) by mouth every 8 (eight) hours as needed for muscle spasms.  . Multiple Vitamins-Minerals (BARIATRIC MULTIVITAMINS/IRON PO) Take 1 tablet by mouth daily.  [name] MYRBETRIQ 50 MG TB24 tablet Take 50 mg by mouth at bedtime.   . Omega-3 Fatty Acids (FISH OIL) 1000 MG CAPS Take 1,000 mg by mouth at bedtime.   [name] oxymetazoline (AFRIN) 0.05 % nasal spray Place 1 spray into both nostrils 2 (two) times daily as needed for congestion.  . Pancrelipase, Lip-Prot-Amyl, (ZENPEP) 15000-47000 units CPEP Take 2 capsules with meals and 1 capsule with snacks  . pantoprazole (PROTONIX) 40 MG tablet TAKE ONE TABLET BY MOUTH EVERY DAY  . prasugrel (EFFIENT) 5 MG TABS tablet Take 5 mg by mouth at bedtime.  . sildenafil (VIAGRA) 100 MG tablet Take 100 mg by mouth daily as needed for erectile dysfunction.   . traZODone (DESYREL) 150 MG tablet Take 1 tablet (150 mg total) by mouth at bedtime.     Allergies:   Patient has no known allergies.   Social History   Tobacco Use  . Smoking status: Former Smoker    Types: Cigars    Quit date: 08/28/2010    Years since quitting: 8.7  . Smokeless tobacco: Never Used  . Tobacco comment: 04/18/2016 "smoked 1 cigar/wk when I did smoke"  Substance Use Topics  . Alcohol use: Not Currently    Alcohol/week: 0.0 standard drinks    Comment: rarely  . Drug use: No     Family Hx: The patient's family history includes Breast cancer in his mother; Cancer in his mother; Coronary artery disease in an other family member; Depression in his brother, mother, and another family member; Diabetes in his father; Early death in his maternal grandfather and paternal grandfather; Healthy in his son and son; Heart attack (age of onset:  31) in his maternal grandfather; Heart disease in his father, maternal grandfather, and mother; Hyperlipidemia in his father; Hypertension in his brother, father, mother, and another family member; Prostate cancer in his father; Skin cancer in his father. There is no history of Colon cancer, Colon polyps, Esophageal cancer, Rectal cancer, or Stomach cancer.  ROS:   Please see the history of present illness.    All other systems reviewed and are negative.   Prior CV studies:   The following studies were reviewed today:  Myoview 03/26/2018  Nuclear stress EF: 47%.  There was no ST segment deviation noted during stress.  No T wave inversion was noted during stress.  This is an intermediate risk study due to reduced systolic function. There is no ischemia.  The left ventricular ejection fraction is mildly decreased (45-54%).  Echocardiogram 01/09/2018 EF 75-30, grade 1 diastolic dysfunction, trivial MR, trivial TR, PASP 24  Cardiac catheterization 02/10/2013 Left main: No obstructive disease.  Left Anterior Descending Artery: Large vessel that courses to the apex. There is a patent stent in the mid vessel without significant restenosis.  There are minor luminal irregularities in the mid and distal vessel. The diagonal branch is patent without obstructive disease.  Circumflex Artery: Moderate sized vessel with patents stents in the proximal and mid vessel and in the distal OM branch. No restenosis in the stents. No obstructive disease noted.  Right Coronary Artery: Moderate sized dominant vessel with patent proximal stents. There is 20-30% restenosis in the stented  segment. Mild plaque in the mid vessel.  Left Ventricular Angiogram: LVEF 55%.  Impression:  1. Triple vessel CAD with patent stents in the RCA, LAD and Circumflex  2. Normal LV systolic function  3. Non-cardiac chest pain    Labs/Other Tests and Data Reviewed:    EKG:  No ECG reviewed.  Recent Labs: 08/15/2018: Magnesium  1.5 03/04/2019: ALT 19 06/03/2019: BUN 30; Creatinine, Ser 0.94; Hemoglobin 13.8; Platelets 285; Potassium 3.6; Sodium 137   Recent Lipid Panel Lab Results  Component Value Date/Time   CHOL 124 06/03/2018 01:33 PM   TRIG 119.0 06/03/2018 01:33 PM   HDL 39.00 (L) 06/03/2018 01:33 PM   CHOLHDL 3 06/03/2018 01:33 PM   LDLCALC 61 06/03/2018 01:33 PM     Wt Readings from Last 3 Encounters:  06/04/19 300 lb (136.1 kg)  06/03/19 (!) 300 lb 11.2 oz (136.4 kg)  04/29/19 (!) 308 lb 8 oz (139.9 kg)     Objective:    Vital Signs:  BP 139/60   Ht 6' (1.829 m)   Wt 300 lb (136.1 kg)   BMI 40.69 kg/m    VITAL SIGNS:  reviewed GEN:  no acute distress EYES:  sclerae anicteric, EOMI - Extraocular Movements Intact RESPIRATORY:  normal respiratory effort NEURO:  alert and oriented x 3, no obvious focal deficit PSYCH:  normal affect  ASSESSMENT & PLAN:    1. Coronary artery disease involving native coronary artery of native heart without angina pectoris Hx of multiple PCI procedures in the past.  Last cardiac catheterization in 2014 with patent stents.  Myoview in 2019 without ischemia.  He is doing well without angina.  Continue ASA, Prasugrel, Atorvastatin. FU in 1 year.   2. Essential hypertension BP up today but usually is 120/60s.  Optimal control.  Continue current Rx.   3. Hyperlipidemia, unspecified hyperlipidemia type LDL optimal on most recent lab work.  Continue current Rx.  He will get follow up Lipids at his next PCP visit.     Time:   Today, I have spent 7 minutes with the patient with telehealth technology discussing the above problems.     Medication Adjustments/Labs and Tests Ordered: Current medicines are reviewed at length with the patient today.  Concerns regarding medicines are outlined above.   Tests Ordered: No orders of the defined types were placed in this encounter.   Medication Changes: No orders of the defined types were placed in this  encounter.   Follow Up:  In Person in 1 year(s)  Signed, Richardson Dopp, PA-C  06/04/2019 3:56 PM    [place]

## 2019-06-04 NOTE — Progress Notes (Signed)
Anesthesia Chart Review:  Follows with cardiology for hx of HLD, HTN, CAD (DES 2008, DES 2009, BMS 2009, patent stents on cath 10/2011 and 01/2013). Cardiac clearance per telephone encounter 05/08/19, "Given past medical history and time since last visit, based on ACC/AHA guidelines, Travis SOTOMAYOR Sr. would be at acceptable risk for the planned procedure without further cardiovascular testing. Pt underwent a procedure 02/2019 in which ASA and Effient were held prior to surgery with no issues. Spoke with patient today and he has no CV complaints. Walks daily and is able to perform greater then 4 METS without symptoms. He is at a 0.9% risk of major cardiac complication in the perioperative period. He may again hold ASA and Effient for 5-7 days as needed per surgical team then resume once ok from their standpoint."  S/p laparascopic gastric sleeve resection 94/76/54 without complication.  Preop labs reviewed, unremarkable. Last A1c 5.8 on 01/24/19.  EKG 08/17/2018: Rate 78 bpm. Sinus rhythm . Atrial premature complex. Probable left atrial enlargement. Nonspecific intraventricular conduction delay  Myocardial Perfusion 03/26/2018  Nuclear stress EF: 47%.  There was no ST segment deviation noted during stress.  No T wave inversion was noted during stress.  This is an intermediate risk study due to reduced systolic function. There is no ischemia.  The left ventricular ejection fraction is mildly decreased (45-54%).  Echo 01/09/2018 Study Conclusions  - Left ventricle: The cavity size was normal. Systolic function was normal. The estimated ejection fraction was in the range of 55% to 60%. Wall motion was normal; there were no regional wall motion abnormalities. Doppler parameters are consistent with abnormal left ventricular relaxation (grade 1 diastolic dysfunction). - Aortic valve: Transvalvular velocity was within the normal range. There was no stenosis. There was no  regurgitation. - Mitral valve: Transvalvular velocity was within the normal range. There was no evidence for stenosis. There was trivial regurgitation. - Right ventricle: The cavity size was normal. Wall thickness was normal. Systolic function was normal. - Tricuspid valve: There was trivial regurgitation. - Pulmonary arteries: Systolic pressure was within the normal range. PA peak pressure: 24 mm Hg (S).  [name] [facility]/Anesthesiology Phone [phone] 06/04/2019 12:03 PM

## 2019-06-04 NOTE — Anesthesia Preprocedure Evaluation (Addendum)
Anesthesia Evaluation  Patient identified by MRN, date of birth, ID band Patient awake    Reviewed: Allergy & Precautions, NPO status , Patient's Chart, lab work & pertinent test results  Airway Mallampati: II  TM Distance: >3 FB Neck ROM: Full    Dental no notable dental hx.    Pulmonary sleep apnea , former smoker,    Pulmonary exam normal breath sounds clear to auscultation       Cardiovascular hypertension, + CAD, + Past MI, + Cardiac Stents and + Peripheral Vascular Disease  Normal cardiovascular exam Rhythm:Regular Rate:Normal     Neuro/Psych  Headaches, Anxiety Depression negative psych ROS   GI/Hepatic GERD  ,(+) Hepatitis -, C  Endo/Other  diabetesMorbid obesity  Renal/GU negative Renal ROS  negative genitourinary   Musculoskeletal  (+) Arthritis , Osteoarthritis,  Fibromyalgia -  Abdominal (+) + obese,   Peds negative pediatric ROS (+)  Hematology negative hematology ROS (+)   Anesthesia Other Findings   Reproductive/Obstetrics negative OB ROS                            Anesthesia Physical Anesthesia Plan  ASA: III  Anesthesia Plan: Spinal   Post-op Pain Management:    Induction: Intravenous  PONV Risk Score and Plan: 1 and Ondansetron and Treatment may vary due to age or medical condition  Airway Management Planned: Simple Face Mask  Additional Equipment:   Intra-op Plan:   Post-operative Plan:   Informed Consent: I have reviewed the patients History and Physical, chart, labs and discussed the procedure including the risks, benefits and alternatives for the proposed anesthesia with the patient or authorized representative who has indicated his/her understanding and acceptance.     Dental advisory given  Plan Discussed with: CRNA  Anesthesia Plan Comments: (Follows with cardiology for hx of HLD, HTN, CAD (DES 2008, DES 2009, BMS 2009, patent stents on cath  10/2011 and 01/2013). Cardiac clearance per telephone encounter 05/08/19, "Given past medical history and time since last visit, based on ACC/AHA guidelines, Travis WILNER Sr. would be at acceptable risk for the planned procedure without further cardiovascular testing. Pt underwent a procedure 02/2019 in which ASA and Effient were held prior to surgery with no issues. Spoke with patient today and he has no CV complaints. Walks daily and is able to perform greater then 4 METS without symptoms. He is at a 0.9% risk of major cardiac complication in the perioperative period. He may again hold ASA and Effient for 5-7 days as needed per surgical team then resume once ok from their standpoint."  S/p laparascopic gastric sleeve resection 02/40/97 without complication.  Preop labs reviewed, unremarkable. Last A1c 5.8 on 01/24/19.  EKG 08/17/2018: Rate 78 bpm. Sinus rhythm . Atrial premature complex. Probable left atrial enlargement. Nonspecific intraventricular conduction delay  Myocardial Perfusion 03/26/2018 Nuclear stress EF: 47%. There was no ST segment deviation noted during stress. No T wave inversion was noted during stress. This is an intermediate risk study due to reduced systolic function. There is no ischemia. The left ventricular ejection fraction is mildly decreased (45-54%).  Echo 01/09/2018 Study Conclusions  - Left ventricle: The cavity size was normal. Systolic function was normal. The estimated ejection fraction was in the range of 55% to 60%. Wall motion was normal; there were no regional wall motion abnormalities. Doppler parameters are consistent with abnormal left ventricular relaxation (grade 1 diastolic dysfunction). - Aortic valve: Transvalvular velocity was within  the normal range. There was no stenosis. There was no regurgitation. - Mitral valve: Transvalvular velocity was within the normal range. There was no evidence for stenosis. There was  trivial regurgitation. - Right ventricle: The cavity size was normal. Wall thickness was normal. Systolic function was normal. - Tricuspid valve: There was trivial regurgitation. - Pulmonary arteries: Systolic pressure was within the normal range. PA peak pressure: 24 mm Hg (S). )       Anesthesia Quick Evaluation

## 2019-06-04 NOTE — Telephone Encounter (Signed)
..   Virtual Visit Pre-Appointment Phone Call  "(Name), I am calling you today to discuss your upcoming appointment. We are currently trying to limit exposure to the virus that causes COVID-19 by seeing patients at home rather than in the office."  1. "What is the BEST phone number to call the day of the visit?" - include this in appointment notes  2. "Do you have or have access to (through a family member/friend) a smartphone with video capability that we can use for your visit?" a. If yes - list this number in appt notes as "cell" (if different from BEST phone #) and list the appointment type as a VIDEO visit in appointment notes b. If no - list the appointment type as a PHONE visit in appointment notes  3. Confirm consent - "In the setting of the current Covid19 crisis, you are scheduled for a (phone or video) visit with your provider on (date) at (time).  Just as we do with many in-office visits, in order for you to participate in this visit, we must obtain consent.  If you'd like, I can send this to your mychart (if signed up) or email for you to review.  Otherwise, I can obtain your verbal consent now.  All virtual visits are billed to your insurance company just like a normal visit would be.  By agreeing to a virtual visit, we'd like you to understand that the technology does not allow for your provider to perform an examination, and thus may limit your provider's ability to fully assess your condition. If your provider identifies any concerns that need to be evaluated in person, we will make arrangements to do so.  Finally, though the technology is pretty good, we cannot assure that it will always work on either your or our end, and in the setting of a video visit, we may have to convert it to a phone-only visit.  In either situation, we cannot ensure that we have a secure connection.  Are you willing to proceed?" STAFF: Did the patient verbally acknowledge consent to telehealth visit? Document  YES/NO here: YES  4. Advise patient to be prepared - "Two hours prior to your appointment, go ahead and check your blood pressure, pulse, oxygen saturation, and your weight (if you have the equipment to check those) and write them all down. When your visit starts, your provider will ask you for this information. If you have an Apple Watch or Kardia device, please plan to have heart rate information ready on the day of your appointment. Please have a pen and paper handy nearby the day of the visit as well."  5. Give patient instructions for MyChart download to smartphone OR Doximity/Doxy.me as below if video visit (depending on what platform provider is using)  6. Inform patient they will receive a phone call 15 minutes prior to their appointment time (may be from unknown caller ID) so they should be prepared to answer    [place]. has been deemed a candidate for a follow-up tele-health visit to limit community exposure during the Covid-19 pandemic. I spoke with the patient via phone to ensure availability of phone/video source, confirm preferred email & phone number, and discuss instructions and expectations.  I reminded Travis SIGMON Sr. to be prepared with any vital sign and/or heart rhythm information that could potentially be obtained via home monitoring, at the time of his visit. I reminded Travis KUMPF Sr. to expect a  phone call prior to his visit.  Juventino Slovak, CMA 06/04/2019 3:07 PM   INSTRUCTIONS FOR DOWNLOADING THE MYCHART APP TO SMARTPHONE  - The patient must first make sure to have activated MyChart and know their login information - If Apple, go to [employer] and type in MyChart in the search bar and download the app. If Android, ask patient to go to [employer] and type in [place] in the search bar and download the app. The app is free but as with any other app downloads, their phone may require them to verify saved payment  information or Apple/Android password.  - The patient will need to then log into the app with their MyChart username and password, and select [facility] as their healthcare provider to link the account. When it is time for your visit, go to the MyChart app, find appointments, and click Begin Video Visit. Be sure to Select Allow for your device to access the Microphone and Camera for your visit. You will then be connected, and your provider will be with you shortly.  **If they have any issues connecting, or need assistance please contact MyChart service desk [phone])**  **If using a computer, in order to ensure the best quality for their visit they will need to use either of the following Internet Browsers: [employer], or Google Chrome**  IF USING DOXIMITY or DOXY.ME - The patient will receive a link just prior to their visit by text.     FULL LENGTH CONSENT FOR TELE-HEALTH VISIT   I hereby voluntarily request, consent and authorize [facility] and its employed or contracted physicians, physician assistants, nurse practitioners or other licensed health care professionals (the Practitioner), to provide me with telemedicine health care services (the "Services") as deemed necessary by the treating Practitioner. I acknowledge and consent to receive the Services by the Practitioner via telemedicine. I understand that the telemedicine visit will involve communicating with the Practitioner through live audiovisual communication technology and the disclosure of certain medical information by electronic transmission. I acknowledge that I have been given the opportunity to request an in-person assessment or other available alternative prior to the telemedicine visit and am voluntarily participating in the telemedicine visit.  I understand that I have the right to withhold or withdraw my consent to the use of telemedicine in the course of my care at any time, without affecting my right  to future care or treatment, and that the Practitioner or I may terminate the telemedicine visit at any time. I understand that I have the right to inspect all information obtained and/or recorded in the course of the telemedicine visit and may receive copies of available information for a reasonable fee.  I understand that some of the potential risks of receiving the Services via telemedicine include:  [name] Delay or interruption in medical evaluation due to technological equipment failure or disruption; . Information transmitted may not be sufficient (e.g. poor resolution of images) to allow for appropriate medical decision making by the Practitioner; and/or  . In rare instances, security protocols could fail, causing a breach of personal health information.  Furthermore, I acknowledge that it is my responsibility to provide information about my medical history, conditions and care that is complete and accurate to the best of my ability. I acknowledge that Practitioner's advice, recommendations, and/or decision may be based on factors not within their control, such as incomplete or inaccurate data provided by me or distortions of diagnostic images or specimens that may  result from electronic transmissions. I understand that the practice of medicine is not an exact science and that Practitioner makes no warranties or guarantees regarding treatment outcomes. I acknowledge that I will receive a copy of this consent concurrently upon execution via email to the email address I last provided but may also request a printed copy by calling the office of [facility].    I understand that my insurance will be billed for this visit.   I have read or had this consent read to me. . I understand the contents of this consent, which adequately explains the benefits and risks of the Services being provided via telemedicine.  . I have been provided ample opportunity to ask questions regarding this consent and the Services  and have had my questions answered to my satisfaction. . I give my informed consent for the services to be provided through the use of telemedicine in my medical care  By participating in this telemedicine visit I agree to the above.

## 2019-06-05 DIAGNOSIS — G4733 Obstructive sleep apnea (adult) (pediatric): Secondary | ICD-10-CM | POA: Diagnosis not present

## 2019-06-05 MED ORDER — DEXTROSE 5 % IV SOLN
3.0000 g | INTRAVENOUS | Status: AC
Start: 2019-06-06 — End: 2019-06-06
  Administered 2019-06-06: 2 g via INTRAVENOUS
  Filled 2019-06-05: qty 3

## 2019-06-06 ENCOUNTER — Ambulatory Visit (HOSPITAL_COMMUNITY): Payer: Medicare HMO

## 2019-06-06 ENCOUNTER — Inpatient Hospital Stay (HOSPITAL_COMMUNITY)
Admission: AD | Admit: 2019-06-06 | Discharge: 2019-06-11 | DRG: 470 | Disposition: A | Discharge status: Skilled Nursing Facility | Payer: Medicare HMO | Attending: Orthopaedic Surgery | Admitting: Orthopaedic Surgery

## 2019-06-06 ENCOUNTER — Other Ambulatory Visit: Payer: Self-pay

## 2019-06-06 ENCOUNTER — Ambulatory Visit (HOSPITAL_COMMUNITY): Payer: Medicare HMO | Admitting: Anesthesiology

## 2019-06-06 ENCOUNTER — Encounter (HOSPITAL_COMMUNITY)
Admission: AD | Disposition: A | Discharge status: Skilled Nursing Facility | Payer: Self-pay | Source: Home / Self Care | Attending: Orthopaedic Surgery

## 2019-06-06 ENCOUNTER — Observation Stay (HOSPITAL_COMMUNITY): Payer: Medicare HMO

## 2019-06-06 ENCOUNTER — Encounter (HOSPITAL_COMMUNITY): Payer: Self-pay | Admitting: Orthopaedic Surgery

## 2019-06-06 ENCOUNTER — Ambulatory Visit (HOSPITAL_COMMUNITY): Payer: Medicare HMO | Admitting: Physician Assistant

## 2019-06-06 DIAGNOSIS — I2510 Atherosclerotic heart disease of native coronary artery without angina pectoris: Secondary | ICD-10-CM | POA: Diagnosis present

## 2019-06-06 DIAGNOSIS — Z96641 Presence of right artificial hip joint: Secondary | ICD-10-CM | POA: Diagnosis present

## 2019-06-06 DIAGNOSIS — Z79890 Hormone replacement therapy (postmenopausal): Secondary | ICD-10-CM

## 2019-06-06 DIAGNOSIS — K219 Gastro-esophageal reflux disease without esophagitis: Secondary | ICD-10-CM | POA: Diagnosis present

## 2019-06-06 DIAGNOSIS — F411 Generalized anxiety disorder: Secondary | ICD-10-CM | POA: Diagnosis not present

## 2019-06-06 DIAGNOSIS — R7611 Nonspecific reaction to tuberculin skin test without active tuberculosis: Secondary | ICD-10-CM | POA: Diagnosis present

## 2019-06-06 DIAGNOSIS — F329 Major depressive disorder, single episode, unspecified: Secondary | ICD-10-CM | POA: Diagnosis present

## 2019-06-06 DIAGNOSIS — Z87440 Personal history of urinary (tract) infections: Secondary | ICD-10-CM

## 2019-06-06 DIAGNOSIS — M797 Fibromyalgia: Secondary | ICD-10-CM | POA: Diagnosis present

## 2019-06-06 DIAGNOSIS — I252 Old myocardial infarction: Secondary | ICD-10-CM

## 2019-06-06 DIAGNOSIS — Z981 Arthrodesis status: Secondary | ICD-10-CM | POA: Diagnosis not present

## 2019-06-06 DIAGNOSIS — Z20822 Contact with and (suspected) exposure to covid-19: Secondary | ICD-10-CM | POA: Diagnosis present

## 2019-06-06 DIAGNOSIS — Z6841 Body Mass Index (BMI) 40.0 and over, adult: Secondary | ICD-10-CM | POA: Diagnosis not present

## 2019-06-06 DIAGNOSIS — Z96653 Presence of artificial knee joint, bilateral: Secondary | ICD-10-CM | POA: Diagnosis present

## 2019-06-06 DIAGNOSIS — F339 Major depressive disorder, recurrent, unspecified: Secondary | ICD-10-CM | POA: Diagnosis not present

## 2019-06-06 DIAGNOSIS — Z89511 Acquired absence of right leg below knee: Secondary | ICD-10-CM

## 2019-06-06 DIAGNOSIS — M2550 Pain in unspecified joint: Secondary | ICD-10-CM | POA: Diagnosis not present

## 2019-06-06 DIAGNOSIS — Z7982 Long term (current) use of aspirin: Secondary | ICD-10-CM | POA: Diagnosis not present

## 2019-06-06 DIAGNOSIS — G4733 Obstructive sleep apnea (adult) (pediatric): Secondary | ICD-10-CM | POA: Diagnosis present

## 2019-06-06 DIAGNOSIS — Z87891 Personal history of nicotine dependence: Secondary | ICD-10-CM

## 2019-06-06 DIAGNOSIS — E785 Hyperlipidemia, unspecified: Secondary | ICD-10-CM | POA: Diagnosis present

## 2019-06-06 DIAGNOSIS — Z8249 Family history of ischemic heart disease and other diseases of the circulatory system: Secondary | ICD-10-CM

## 2019-06-06 DIAGNOSIS — Z8701 Personal history of pneumonia (recurrent): Secondary | ICD-10-CM

## 2019-06-06 DIAGNOSIS — Z7401 Bed confinement status: Secondary | ICD-10-CM | POA: Diagnosis not present

## 2019-06-06 DIAGNOSIS — G894 Chronic pain syndrome: Secondary | ICD-10-CM | POA: Diagnosis present

## 2019-06-06 DIAGNOSIS — Z87442 Personal history of urinary calculi: Secondary | ICD-10-CM

## 2019-06-06 DIAGNOSIS — I10 Essential (primary) hypertension: Secondary | ICD-10-CM | POA: Diagnosis present

## 2019-06-06 DIAGNOSIS — Z96642 Presence of left artificial hip joint: Secondary | ICD-10-CM | POA: Diagnosis not present

## 2019-06-06 DIAGNOSIS — Z833 Family history of diabetes mellitus: Secondary | ICD-10-CM

## 2019-06-06 DIAGNOSIS — E1151 Type 2 diabetes mellitus with diabetic peripheral angiopathy without gangrene: Secondary | ICD-10-CM | POA: Diagnosis present

## 2019-06-06 DIAGNOSIS — Z79899 Other long term (current) drug therapy: Secondary | ICD-10-CM | POA: Diagnosis not present

## 2019-06-06 DIAGNOSIS — Z9884 Bariatric surgery status: Secondary | ICD-10-CM | POA: Diagnosis not present

## 2019-06-06 DIAGNOSIS — Z9181 History of falling: Secondary | ICD-10-CM

## 2019-06-06 DIAGNOSIS — R6889 Other general symptoms and signs: Secondary | ICD-10-CM | POA: Diagnosis not present

## 2019-06-06 DIAGNOSIS — M1612 Unilateral primary osteoarthritis, left hip: Principal | ICD-10-CM | POA: Diagnosis present

## 2019-06-06 DIAGNOSIS — Z955 Presence of coronary angioplasty implant and graft: Secondary | ICD-10-CM

## 2019-06-06 DIAGNOSIS — Z471 Aftercare following joint replacement surgery: Secondary | ICD-10-CM | POA: Diagnosis not present

## 2019-06-06 DIAGNOSIS — Z419 Encounter for procedure for purposes other than remedying health state, unspecified: Secondary | ICD-10-CM

## 2019-06-06 HISTORY — PX: TOTAL HIP ARTHROPLASTY: SHX124

## 2019-06-06 LAB — GLUCOSE, CAPILLARY
Glucose-Capillary: 124 mg/dL — ABNORMAL HIGH (ref 70–99)
Glucose-Capillary: 107 mg/dL — ABNORMAL HIGH (ref 70–99)
Glucose-Capillary: 153 mg/dL — ABNORMAL HIGH (ref 70–99)

## 2019-06-06 SURGERY — ARTHROPLASTY, HIP, TOTAL, ANTERIOR APPROACH
Anesthesia: Spinal | Site: Hip | Laterality: Left

## 2019-06-06 MED ORDER — CEFAZOLIN SODIUM-DEXTROSE 1-4 GM/50ML-% IV SOLN
1.0000 g | Freq: Four times a day (QID) | INTRAVENOUS | Status: AC
  Administered 2019-06-06 (×2): 1 g via INTRAVENOUS
  Filled 2019-06-06 (×2): qty 50

## 2019-06-06 MED ORDER — METHOCARBAMOL 1000 MG/10ML IJ SOLN
500.0000 mg | Freq: Four times a day (QID) | INTRAVENOUS | Status: DC | PRN
  Filled 2019-06-06: qty 5

## 2019-06-06 MED ORDER — BUPIVACAINE IN DEXTROSE 0.75-8.25 % IT SOLN: INTRATHECAL | Status: DC | PRN

## 2019-06-06 MED ORDER — MIRABEGRON ER 50 MG PO TB24
50.0000 mg | ORAL_TABLET | Freq: Every day | ORAL | Status: DC
  Administered 2019-06-06 – 2019-06-10 (×5): 50 mg via ORAL
  Filled 2019-06-06 (×7): qty 1

## 2019-06-06 MED ORDER — SODIUM CHLORIDE 0.9 % IV SOLN: INTRAVENOUS | Status: DC

## 2019-06-06 MED ORDER — PANCRELIPASE (LIP-PROT-AMYL) 15000-47000 UNITS PO CPEP: 10440.0000 | ORAL_CAPSULE | Freq: Three times a day (TID) | ORAL | Status: DC

## 2019-06-06 MED ORDER — BUPIVACAINE IN DEXTROSE 0.75-8.25 % IT SOLN
INTRATHECAL | Status: DC | PRN
  Administered 2019-06-06: 1.8 mg via INTRATHECAL

## 2019-06-06 MED ORDER — PHENYLEPHRINE 40 MCG/ML (10ML) SYRINGE FOR IV PUSH (FOR BLOOD PRESSURE SUPPORT)
PREFILLED_SYRINGE | INTRAVENOUS | Status: AC
  Filled 2019-06-06: qty 10

## 2019-06-06 MED ORDER — ONDANSETRON HCL 4 MG PO TABS: 4.0000 mg | ORAL_TABLET | Freq: Four times a day (QID) | ORAL | Status: DC | PRN

## 2019-06-06 MED ORDER — HYDROCHLOROTHIAZIDE 25 MG PO TABS
25.0000 mg | ORAL_TABLET | Freq: Every day | ORAL | Status: DC
  Administered 2019-06-06 – 2019-06-10 (×5): 25 mg via ORAL
  Filled 2019-06-06 (×5): qty 1

## 2019-06-06 MED ORDER — EPHEDRINE 5 MG/ML INJ
INTRAVENOUS | Status: AC
  Filled 2019-06-06: qty 10

## 2019-06-06 MED ORDER — MIDAZOLAM HCL 2 MG/2ML IJ SOLN
INTRAMUSCULAR | Status: AC
  Filled 2019-06-06: qty 2

## 2019-06-06 MED ORDER — OXYCODONE HCL 5 MG PO TABS
5.0000 mg | ORAL_TABLET | ORAL | Status: DC | PRN
  Administered 2019-06-06 – 2019-06-08 (×4): 10 mg via ORAL
  Filled 2019-06-06 (×8): qty 2

## 2019-06-06 MED ORDER — PHENOL 1.4 % MT LIQD: 1.0000 | OROMUCOSAL | Status: DC | PRN

## 2019-06-06 MED ORDER — ASPIRIN 81 MG PO CHEW
81.0000 mg | CHEWABLE_TABLET | Freq: Two times a day (BID) | ORAL | Status: DC
  Administered 2019-06-06 – 2019-06-11 (×10): 81 mg via ORAL
  Filled 2019-06-06 (×10): qty 1

## 2019-06-06 MED ORDER — OXYCODONE HCL 5 MG PO TABS: 5.0000 mg | ORAL_TABLET | Freq: Once | ORAL | Status: DC | PRN

## 2019-06-06 MED ORDER — METOCLOPRAMIDE HCL 5 MG PO TABS
5.0000 mg | ORAL_TABLET | Freq: Three times a day (TID) | ORAL | Status: DC | PRN
  Filled 2019-06-06: qty 1

## 2019-06-06 MED ORDER — MIDAZOLAM HCL 5 MG/5ML IJ SOLN
INTRAMUSCULAR | Status: DC | PRN
  Administered 2019-06-06: 2 mg via INTRAVENOUS

## 2019-06-06 MED ORDER — PANCRELIPASE (LIP-PROT-AMYL) 36000-114000 UNITS PO CPEP
36000.0000 [IU] | ORAL_CAPSULE | Freq: Three times a day (TID) | ORAL | Status: DC
  Administered 2019-06-06 – 2019-06-11 (×15): 36000 [IU] via ORAL
  Filled 2019-06-06 (×18): qty 1

## 2019-06-06 MED ORDER — FENTANYL CITRATE (PF) 100 MCG/2ML IJ SOLN
INTRAMUSCULAR | Status: AC
  Administered 2019-06-06: 50 ug via INTRAVENOUS
  Filled 2019-06-06: qty 2

## 2019-06-06 MED ORDER — CALCIUM CARBONATE ANTACID 500 MG PO CHEW
1000.0000 mg | CHEWABLE_TABLET | Freq: Every day | ORAL | Status: DC
  Administered 2019-06-06 – 2019-06-10 (×5): 1000 mg via ORAL
  Filled 2019-06-06 (×5): qty 5

## 2019-06-06 MED ORDER — HYDROMORPHONE HCL 1 MG/ML IJ SOLN
0.2500 mg | INTRAMUSCULAR | Status: DC | PRN
  Administered 2019-06-06 (×2): 0.5 mg via INTRAVENOUS

## 2019-06-06 MED ORDER — BENZTROPINE MESYLATE 0.5 MG PO TABS
0.5000 mg | ORAL_TABLET | Freq: Every day | ORAL | Status: DC
  Administered 2019-06-06 – 2019-06-10 (×5): 0.5 mg via ORAL
  Filled 2019-06-06 (×7): qty 1

## 2019-06-06 MED ORDER — PROPOFOL 500 MG/50ML IV EMUL
INTRAVENOUS | Status: DC | PRN
  Administered 2019-06-06: 100 ug/kg/min via INTRAVENOUS
  Administered 2019-06-06: 75 ug/kg/min via INTRAVENOUS

## 2019-06-06 MED ORDER — ONDANSETRON HCL 4 MG/2ML IJ SOLN: 4.0000 mg | Freq: Four times a day (QID) | INTRAMUSCULAR | Status: DC | PRN

## 2019-06-06 MED ORDER — ACETAMINOPHEN 325 MG PO TABS
325.0000 mg | ORAL_TABLET | Freq: Four times a day (QID) | ORAL | Status: DC | PRN
  Administered 2019-06-07 – 2019-06-10 (×8): 650 mg via ORAL
  Filled 2019-06-06 (×8): qty 2

## 2019-06-06 MED ORDER — PROPOFOL 10 MG/ML IV BOLUS
INTRAVENOUS | Status: DC | PRN
  Administered 2019-06-06: 30 mg via INTRAVENOUS
  Administered 2019-06-06: 20 mg via INTRAVENOUS

## 2019-06-06 MED ORDER — SODIUM CHLORIDE 0.9 % IR SOLN
Status: DC | PRN
  Administered 2019-06-06: 1000 mL

## 2019-06-06 MED ORDER — PANCRELIPASE (LIP-PROT-AMYL) 15000-47000 UNITS PO CPEP: 4700.0000 [IU] | ORAL_CAPSULE | Freq: Three times a day (TID) | ORAL | Status: DC

## 2019-06-06 MED ORDER — METHOCARBAMOL 500 MG PO TABS
500.0000 mg | ORAL_TABLET | Freq: Four times a day (QID) | ORAL | Status: DC | PRN
  Administered 2019-06-06 – 2019-06-10 (×9): 500 mg via ORAL
  Filled 2019-06-06 (×9): qty 1

## 2019-06-06 MED ORDER — POVIDONE-IODINE 10 % EX SWAB
2.0000 "application " | Freq: Once | CUTANEOUS | Status: AC
  Administered 2019-06-06: 2 via TOPICAL

## 2019-06-06 MED ORDER — DOCUSATE SODIUM 100 MG PO CAPS
100.0000 mg | ORAL_CAPSULE | Freq: Two times a day (BID) | ORAL | Status: DC
  Administered 2019-06-06 – 2019-06-10 (×9): 100 mg via ORAL
  Filled 2019-06-06 (×10): qty 1

## 2019-06-06 MED ORDER — METOCLOPRAMIDE HCL 5 MG/ML IJ SOLN: 5.0000 mg | Freq: Three times a day (TID) | INTRAMUSCULAR | Status: DC | PRN

## 2019-06-06 MED ORDER — OXYCODONE HCL 5 MG/5ML PO SOLN: 5.0000 mg | Freq: Once | ORAL | Status: DC | PRN

## 2019-06-06 MED ORDER — ATORVASTATIN CALCIUM 10 MG PO TABS
20.0000 mg | ORAL_TABLET | Freq: Every day | ORAL | Status: DC
  Administered 2019-06-06 – 2019-06-10 (×5): 20 mg via ORAL
  Filled 2019-06-06 (×5): qty 2

## 2019-06-06 MED ORDER — ALBUMIN HUMAN 5 % IV SOLN: INTRAVENOUS | Status: DC | PRN

## 2019-06-06 MED ORDER — PHENYLEPHRINE HCL-NACL 10-0.9 MG/250ML-% IV SOLN
INTRAVENOUS | Status: DC | PRN
  Administered 2019-06-06: 20 ug/min via INTRAVENOUS

## 2019-06-06 MED ORDER — FENTANYL CITRATE (PF) 100 MCG/2ML IJ SOLN
50.0000 ug | INTRAMUSCULAR | Status: AC | PRN
  Administered 2019-06-06: 50 ug via INTRAVENOUS

## 2019-06-06 MED ORDER — PANCRELIPASE (LIP-PROT-AMYL) 12000-38000 UNITS PO CPEP
12000.0000 [IU] | ORAL_CAPSULE | ORAL | Status: DC
  Administered 2019-06-07 – 2019-06-11 (×11): 12000 [IU] via ORAL
  Filled 2019-06-06 (×18): qty 1

## 2019-06-06 MED ORDER — PANTOPRAZOLE SODIUM 40 MG PO TBEC
40.0000 mg | DELAYED_RELEASE_TABLET | Freq: Every day | ORAL | Status: DC
  Administered 2019-06-06 – 2019-06-11 (×6): 40 mg via ORAL
  Filled 2019-06-06 (×6): qty 1

## 2019-06-06 MED ORDER — PRASUGREL HCL 5 MG PO TABS
5.0000 mg | ORAL_TABLET | Freq: Every day | ORAL | Status: DC
  Administered 2019-06-06 – 2019-06-10 (×5): 5 mg via ORAL
  Filled 2019-06-06 (×7): qty 1

## 2019-06-06 MED ORDER — OXYCODONE HCL 5 MG PO TABS
10.0000 mg | ORAL_TABLET | ORAL | Status: DC | PRN
  Administered 2019-06-06 – 2019-06-08 (×5): 10 mg via ORAL
  Administered 2019-06-08: 15 mg via ORAL
  Administered 2019-06-08: 10 mg via ORAL
  Administered 2019-06-08 – 2019-06-11 (×13): 15 mg via ORAL
  Filled 2019-06-06 (×4): qty 3
  Filled 2019-06-06: qty 2
  Filled 2019-06-06 (×10): qty 3
  Filled 2019-06-06: qty 2

## 2019-06-06 MED ORDER — LACTATED RINGERS IV SOLN: INTRAVENOUS | Status: DC

## 2019-06-06 MED ORDER — PANCRELIPASE (LIP-PROT-AMYL) 12000-38000 UNITS PO CPEP
12000.0000 [IU] | ORAL_CAPSULE | Freq: Three times a day (TID) | ORAL | Status: DC
  Filled 2019-06-06 (×2): qty 1

## 2019-06-06 MED ORDER — DIPHENHYDRAMINE HCL 12.5 MG/5ML PO ELIX
12.5000 mg | ORAL_SOLUTION | ORAL | Status: DC | PRN
  Administered 2019-06-08 – 2019-06-10 (×8): 25 mg via ORAL
  Filled 2019-06-06 (×9): qty 10

## 2019-06-06 MED ORDER — GABAPENTIN 400 MG PO CAPS
800.0000 mg | ORAL_CAPSULE | Freq: Three times a day (TID) | ORAL | Status: DC
  Administered 2019-06-06 – 2019-06-11 (×15): 800 mg via ORAL
  Filled 2019-06-06 (×15): qty 2

## 2019-06-06 MED ORDER — TRANEXAMIC ACID-NACL 1000-0.7 MG/100ML-% IV SOLN
1000.0000 mg | INTRAVENOUS | Status: AC
  Administered 2019-06-06: 1000 mg via INTRAVENOUS
  Filled 2019-06-06: qty 100

## 2019-06-06 MED ORDER — HYDROMORPHONE HCL 1 MG/ML IJ SOLN
INTRAMUSCULAR | Status: AC
  Filled 2019-06-06: qty 1

## 2019-06-06 MED ORDER — KETOROLAC TROMETHAMINE 15 MG/ML IJ SOLN
15.0000 mg | Freq: Four times a day (QID) | INTRAMUSCULAR | Status: AC
  Administered 2019-06-06 (×2): 15 mg via INTRAVENOUS
  Filled 2019-06-06 (×2): qty 1

## 2019-06-06 MED ORDER — MENTHOL 3 MG MT LOZG: 1.0000 | LOZENGE | OROMUCOSAL | Status: DC | PRN

## 2019-06-06 MED ORDER — CHLORHEXIDINE GLUCONATE 4 % EX LIQD: 60.0000 mL | Freq: Once | CUTANEOUS | Status: DC

## 2019-06-06 MED ORDER — HYDROMORPHONE HCL 1 MG/ML IJ SOLN
0.5000 mg | INTRAMUSCULAR | Status: DC | PRN
  Administered 2019-06-06: 1 mg via INTRAVENOUS
  Filled 2019-06-06 (×2): qty 1

## 2019-06-06 MED ORDER — 0.9 % SODIUM CHLORIDE (POUR BTL) OPTIME
TOPICAL | Status: DC | PRN
  Administered 2019-06-06: 1000 mL

## 2019-06-06 MED ORDER — CLONAZEPAM 0.5 MG PO TABS
0.5000 mg | ORAL_TABLET | Freq: Every day | ORAL | Status: DC
  Administered 2019-06-06 – 2019-06-10 (×5): 0.5 mg via ORAL
  Filled 2019-06-06 (×5): qty 1

## 2019-06-06 MED ORDER — PROMETHAZINE HCL 25 MG/ML IJ SOLN: 6.2500 mg | INTRAMUSCULAR | Status: DC | PRN

## 2019-06-06 MED ORDER — DULOXETINE HCL 30 MG PO CPEP
30.0000 mg | ORAL_CAPSULE | Freq: Two times a day (BID) | ORAL | Status: DC
  Administered 2019-06-06 – 2019-06-11 (×10): 30 mg via ORAL
  Filled 2019-06-06 (×10): qty 1

## 2019-06-06 MED ORDER — EPHEDRINE SULFATE-NACL 50-0.9 MG/10ML-% IV SOSY
PREFILLED_SYRINGE | INTRAVENOUS | Status: DC | PRN
  Administered 2019-06-06 (×2): 10 mg via INTRAVENOUS

## 2019-06-06 MED ORDER — ARIPIPRAZOLE 5 MG PO TABS
5.0000 mg | ORAL_TABLET | Freq: Every day | ORAL | Status: DC
  Administered 2019-06-06 – 2019-06-10 (×5): 5 mg via ORAL
  Filled 2019-06-06 (×6): qty 1

## 2019-06-06 SURGICAL SUPPLY — 57 items
APL SKNCLS STERI-STRIP NONHPOA (GAUZE/BANDAGES/DRESSINGS) ×1
ARTICULEZE HEAD (Hips) ×2 IMPLANT
BENZOIN TINCTURE PRP APPL 2/3 (GAUZE/BANDAGES/DRESSINGS) ×2 IMPLANT
BLADE CLIPPER SURG (BLADE) IMPLANT
BLADE SAW SGTL 18X1.27X75 (BLADE) ×2 IMPLANT
COVER SURGICAL LIGHT HANDLE (MISCELLANEOUS) ×2 IMPLANT
COVER WAND RF STERILE (DRAPES) ×2 IMPLANT
CUP ACET PNNCL SECTR W/GRIP 56 (Hips) IMPLANT
DRAPE C-ARM 42X72 X-RAY (DRAPES) ×2 IMPLANT
DRAPE STERI IOBAN 125X83 (DRAPES) ×2 IMPLANT
DRAPE U-SHAPE 47X51 STRL (DRAPES) ×6 IMPLANT
DRSG AQUACEL AG ADV 3.5X10 (GAUZE/BANDAGES/DRESSINGS) ×2 IMPLANT
DURAPREP 26ML APPLICATOR (WOUND CARE) ×2 IMPLANT
ELECT BLADE 4.0 EZ CLEAN MEGAD (MISCELLANEOUS) ×2
ELECT BLADE 6.5 EXT (BLADE) IMPLANT
ELECT REM PT RETURN 9FT ADLT (ELECTROSURGICAL) ×2
ELECTRODE BLDE 4.0 EZ CLN MEGD (MISCELLANEOUS) ×1 IMPLANT
ELECTRODE REM PT RTRN 9FT ADLT (ELECTROSURGICAL) ×1 IMPLANT
FACESHIELD WRAPAROUND (MASK) ×4 IMPLANT
FACESHIELD WRAPAROUND OR TEAM (MASK) ×2 IMPLANT
GAUZE XEROFORM 5X9 LF (GAUZE/BANDAGES/DRESSINGS) ×1 IMPLANT
GLOVE BIOGEL PI IND STRL 8 (GLOVE) ×2 IMPLANT
GLOVE BIOGEL PI INDICATOR 8 (GLOVE) ×2
GLOVE ECLIPSE 8.0 STRL XLNG CF (GLOVE) ×2 IMPLANT
GLOVE ORTHO TXT STRL SZ7.5 (GLOVE) ×4 IMPLANT
GOWN STRL REUS W/ TWL LRG LVL3 (GOWN DISPOSABLE) ×2 IMPLANT
GOWN STRL REUS W/ TWL XL LVL3 (GOWN DISPOSABLE) ×2 IMPLANT
GOWN STRL REUS W/TWL LRG LVL3 (GOWN DISPOSABLE) ×4
GOWN STRL REUS W/TWL XL LVL3 (GOWN DISPOSABLE) ×4
HANDPIECE INTERPULSE COAX TIP (DISPOSABLE) ×2
HEAD ARTICULEZE (Hips) IMPLANT
KIT BASIN OR (CUSTOM PROCEDURE TRAY) ×2 IMPLANT
KIT TURNOVER KIT B (KITS) ×2 IMPLANT
MANIFOLD NEPTUNE II (INSTRUMENTS) ×2 IMPLANT
NS IRRIG 1000ML POUR BTL (IV SOLUTION) ×2 IMPLANT
PACK TOTAL JOINT (CUSTOM PROCEDURE TRAY) ×2 IMPLANT
PAD ARMBOARD 7.5X6 YLW CONV (MISCELLANEOUS) ×2 IMPLANT
PINN SECTOR W/GRIP ACE CUP 56 (Hips) ×2 IMPLANT
PINNACLE ALTRX PLUS 4 N 36X56 (Hips) ×1 IMPLANT
SET HNDPC FAN SPRY TIP SCT (DISPOSABLE) ×1 IMPLANT
STAPLER VISISTAT 35W (STAPLE) IMPLANT
STEM FEMORAL SZ9 HIGH ACTIS (Stem) ×1 IMPLANT
STRIP CLOSURE SKIN 1/2X4 (GAUZE/BANDAGES/DRESSINGS) ×4 IMPLANT
SUT ETHIBOND NAB CT1 #1 30IN (SUTURE) ×2 IMPLANT
SUT MNCRL AB 4-0 PS2 18 (SUTURE) IMPLANT
SUT VIC AB 0 CT1 27 (SUTURE) ×2
SUT VIC AB 0 CT1 27XBRD ANBCTR (SUTURE) ×1 IMPLANT
SUT VIC AB 1 CT1 27 (SUTURE) ×2
SUT VIC AB 1 CT1 27XBRD ANBCTR (SUTURE) ×1 IMPLANT
SUT VIC AB 2-0 CT1 27 (SUTURE) ×2
SUT VIC AB 2-0 CT1 TAPERPNT 27 (SUTURE) ×1 IMPLANT
TOWEL GREEN STERILE (TOWEL DISPOSABLE) ×2 IMPLANT
TOWEL GREEN STERILE FF (TOWEL DISPOSABLE) ×2 IMPLANT
TRAY CATH 16FR W/PLASTIC CATH (SET/KITS/TRAYS/PACK) IMPLANT
TRAY FOLEY W/BAG SLVR 16FR (SET/KITS/TRAYS/PACK)
TRAY FOLEY W/BAG SLVR 16FR ST (SET/KITS/TRAYS/PACK) IMPLANT
WATER STERILE IRR 1000ML POUR (IV SOLUTION) ×4 IMPLANT

## 2019-06-06 NOTE — TOC Transition Note (Signed)
Transition of Care [facility]) - CM/SW Discharge Note   Patient Details  Name: Travis [name]. MRN: [id number] Date of Birth: [birth year]  [facility] of [facility]) CM/SW Contact:  Claudie Leach, RN 06/06/2019, 3:45 PM   Clinical Narrative:    Discussed Home health PT with patient.  Patient reports he has used [facility] in the past and would like to use again.  Referral accepted by Tiffany with [facility].    Final next level of care: [place] Barriers to Discharge: No Barriers Identified   Patient Goals and CMS Choice Patient states their goals for this hospitalization and ongoing recovery are:: to get home CMS Medicare.gov Compare Post Acute Care list provided to:: Patient Choice offered to / list presented to : Patient   Discharge Plan and Services      HH Arranged: PT [place] Agency: [facility] (formerly [employer]) Date [place]: 06/06/19 Time [place]: [place] Representative spoke with at [place]: Jonelle Sidle

## 2019-06-06 NOTE — Anesthesia Procedure Notes (Signed)
Spinal  Patient location during procedure: OR Start time: 06/06/2019 12:06 PM End time: 06/06/2019 12:11 PM Staffing Performed: anesthesiologist  Anesthesiologist: Lynda Rainwater, MD Preanesthetic Checklist Completed: patient identified, IV checked, site marked, risks and benefits discussed, surgical consent, monitors and equipment checked, pre-op evaluation and timeout performed Spinal Block Patient position: sitting Prep: DuraPrep Patient monitoring: heart rate, cardiac monitor, continuous pulse ox and blood pressure Approach: midline Location: L3-4 Injection technique: single-shot Needle Needle type: Sprotte and Quincke  Needle gauge: 22 G Needle length: 9 cm Assessment Sensory level: T4

## 2019-06-06 NOTE — Anesthesia Postprocedure Evaluation (Signed)
Anesthesia Post Note  Patient: Travis BRUMETT Sr.  Procedure(s) Performed: LEFT TOTAL HIP ARTHROPLASTY ANTERIOR APPROACH (Left Hip)     Patient location during evaluation: PACU Anesthesia Type: Spinal Level of consciousness: awake and alert Pain management: pain level controlled Vital Signs Assessment: post-procedure vital signs reviewed and stable Respiratory status: spontaneous breathing, nonlabored ventilation and respiratory function stable Cardiovascular status: blood pressure returned to baseline and stable Postop Assessment: no apparent nausea or vomiting Anesthetic complications: no    Last Vitals:  Vitals:   06/06/19 1456 06/06/19 1521  BP: 125/62 129/62  Pulse: (!) 45 (!) 46  Resp: 14 19  Temp: 36.6 C 36.9 C  SpO2: 100% 100%    Last Pain:  Vitals:   06/06/19 1521  TempSrc: Oral  PainSc:                  Lynda Rainwater

## 2019-06-06 NOTE — H&P (Signed)
TOTAL HIP ADMISSION H&P  Patient is admitted for left total hip arthroplasty.  Subjective:  Chief Complaint: left hip pain  HPI: Travis Novak Sr., 69 y.o. male, has a history of pain and functional disability in the left hip(s) due to arthritis and patient has failed non-surgical conservative treatments for greater than 12 weeks to include NSAID's and/or analgesics, corticosteriod injections, flexibility and strengthening excercises, use of assistive devices, weight reduction as appropriate and activity modification.  Onset of symptoms was gradual starting 1 years ago with gradually worsening course since that time.The patient noted no past surgery on the left hip(s).  Patient currently rates pain in the left hip at 10 out of 10 with activity. Patient has night pain, worsening of pain with activity and weight bearing, trendelenberg gait, pain that interfers with activities of daily living and pain with passive range of motion. Patient has evidence of subchondral sclerosis, periarticular osteophytes and joint space narrowing by imaging studies. This condition presents safety issues increasing the risk of falls.  There is no current active infection.  Patient Active Problem List   Diagnosis Date Noted  . Unilateral primary osteoarthritis, left hip 06/06/2019  . S/P laparoscopic sleeve gastrectomy 03/03/2019  . Rash 01/24/2019  . Fever 08/15/2018  . UTI (urinary tract infection) 08/15/2018  . Atypical chest pain 08/15/2018  . Fall at home, initial encounter 08/15/2018  . Chronic anemia 08/15/2018  . Hypokalemia 08/15/2018  . Bowel incontinence 07/25/2018  . Other spondylosis with radiculopathy, lumbar region 07/16/2018  . Status post lumbar laminectomy 07/16/2018  . Status post THR (total hip replacement) 04/17/2018  . Unilateral primary osteoarthritis, right hip   . Morbid obesity with BMI of 40.0-44.9, adult ([place]) 02/28/2018  . Myocardial infarction ([place]) 02/25/2018  . Coronary  artery disease involving native coronary artery of native heart 02/25/2018  . PAD (peripheral artery disease) ([place]) 02/25/2018  . S/P BKA (below knee amputation) unilateral, right ([place]) 02/25/2018  . Sacroiliitis ([place]) 02/25/2018  . SOB (shortness of breath) 01/03/2018  . Acute on chronic heart failure ([place]) 01/03/2018  . Severe right groin pain 12/20/2017  . Morbid obesity ([place]) 10/09/2017  . Exposure to sexually transmitted disease (STD) 07/14/2017  . Low back pain 07/13/2017  . Leukoplakia, tongue 01/26/2017  . Acute sinus infection 10/20/2016  . Hyperglycemia 10/20/2016  . Skin lesion 10/20/2016  . S/P unilateral BKA (below knee amputation), right ([place]) 06/14/2016  . Charcot foot due to diabetes mellitus ([place])   . Charcot's arthropathy associated with type 2 diabetes mellitus ([place]) 04/11/2016  . Preventative health care 11/05/2015  . Major depression 09/13/2015  . S/P TKR (total knee replacement) bilaterally 09/13/2015  . GERD (gastroesophageal reflux disease) 09/08/2015  . S/P laparoscopic hernia repair 05/11/2015  . PPD positive 04/08/2015  . Benign neoplasm of descending colon   . Benign neoplasm of cecum   . Acute blood loss anemia   . Chronic anticoagulation   . Occult blood positive stool 10/17/2014  . General weakness 07/14/2014  . Urinary incontinence 07/14/2014  . Headache(784.0) 10/15/2013  . Spinal stenosis in cervical region 09/26/2013  . Spinal stenosis of lumbar region 09/26/2013  . Hand joint pain 06/10/2013  . Rotator cuff tear arthropathy of both shoulders 06/10/2013  . Skin lesion of cheek 05/01/2013  . Pain of right thumb 04/03/2013  . Balance disorder 03/12/2013  . Gait disorder 03/12/2013  . Tremor 03/12/2013  . Left hip pain 03/12/2013  . Pre-ulcerative corn or callous 02/06/2013  . Anxiety 11/12/2011  .  OSA on CPAP 11/07/2011  . Bradycardia 10/20/2011  . Insomnia 10/04/2011  . Obesity 01/12/2011  . Diabetes mellitus type 2 in obese ([place])  09/27/2010  . ERECTILE DYSFUNCTION, ORGANIC 05/30/2010  . [place] DISEASE, LUMBAR 04/19/2010  . SCIATICA, LEFT 04/19/2010  . Chronic pain syndrome 10/27/2009  . Hyperlipidemia 07/15/2009  . Essential hypertension 06/24/2009  . Coronary artery disease involving native coronary artery of native heart without angina pectoris 06/24/2009  . Allergic rhinitis 06/24/2009  . URETHRAL STRICTURE 06/24/2009  . DEGENERATIVE JOINT DISEASE 06/24/2009  . SHOULDER PAIN, BILATERAL 06/24/2009  . FATIGUE 06/24/2009  . NEPHROLITHIASIS, HX OF 06/24/2009   Past Medical History:  Diagnosis Date  . ALLERGIC RHINITIS 06/24/2009  . Allergy   . Anxiety 11/12/2011   Adequate for discharge   . Arthritis    "all my joints" (09/30/2013)  . Arthritis of foot, right, degenerative 04/15/2014  . Balance disorder 03/12/2013  . Benign neoplasm of cecum   . Benign neoplasm of descending colon   . CAD (coronary artery disease) 06/24/2009   5 stents placed in 2007    . Chronic anticoagulation   . Chronic pain syndrome 10/27/2009   of ankle, shoulders, low back.  sciatica.   . Closed fracture of right foot 10/17/2014  . CORONARY ARTERY DISEASE 06/24/2009   a. s/p multiple PCIs - In 2008 he had a Taxus DES to the mild LAD, Endeavor DES to mid LCX and distal LCX. In January 2009 he had DES to distal LCX, mid LCX and proximal LCX. In November 2009 had BMS x 2 to the mid RCA. Cath 10/2011 with patent stents, noncardiac CP. LHC 01/2013: patent stents (noncardiac CP).  . DEGENERATIVE JOINT DISEASE 06/24/2009   Qualifier: Diagnosis of  By: [name], [name]   . Depression   . Depression with anxiety    Prior suicide attempt  . [place] DISEASE, LUMBAR 04/19/2010  . ERECTILE DYSFUNCTION, ORGANIC 05/30/2010  . Essential hypertension 06/24/2009   Qualifier: Diagnosis of  By: [name], [name]   . Fibromyalgia   . Fracture dislocation of ankle joint 09/02/2015  . Gait disorder 03/12/2013  . General weakness 07/14/2014  . GERD (gastroesophageal  reflux disease) 09/08/2015  . Hand joint pain 06/10/2013  . Heart murmur   . Hepatitis C    treated pt. unknown with what he was a teenager  . History of kidney stones   . Hyperlipidemia 07/15/2009   Qualifier: Diagnosis of  By: Aundra Dubin, MD, [place]    . HYPERLIPIDEMIA-MIXED 07/15/2009  . HYPERTENSION 06/24/2009  . Insomnia 10/04/2011  . Irregular heart beat   . Left hip pain 03/12/2013   Injected under ultrasound guidance on June 24, 2013   . Major depression 09/13/2015  . Myocardial infarction ([place]) 2008  . Non-cardiac chest pain 10/2011, 01/2013  . Obesity   . Occult blood positive stool 10/17/2014  . Open ankle fracture 09/02/2015  . OSA (obstructive sleep apnea)    not using CPAP (09/30/2013)  . Pain of right thumb 04/03/2013  . Pneumonia   . PPD positive 04/08/2015  . Pre-ulcerative corn or callous 02/06/2013  . Rotator cuff tear arthropathy of both shoulders 06/10/2013   History of bilateral shoulder cuff surgery for rotator cuff tears. Reports increase in pain 09/11/2015 during physical therapy of the left shoulder.   [name] SCIATICA, LEFT 04/19/2010   Qualifier: Diagnosis of  By: [name], [name]   . Sleep apnea    wears cpap   . Spinal  stenosis in cervical region 09/26/2013  . Spinal stenosis, lumbar region, with neurogenic claudication 09/26/2013  . Type II diabetes mellitus ([place]) 2012   no meds in 09/2014.   [name] Uncontrolled type 2 DM with peripheral circulatory disorder ([place]) 10/04/2013  . URETHRAL STRICTURE 06/24/2009   self catheterizes.     Past Surgical History:  Procedure Laterality Date  . AMPUTATION Right 06/14/2016   Procedure: AMPUTATION BELOW KNEE;  Surgeon: Newt Minion, MD;  Location: [facility];  Service: Orthopedics;  Laterality: Right;  . ANKLE FUSION Right 04/15/2014   Procedure: Right Subtalar, Talonavicular Fusion;  Surgeon: Newt Minion, MD;  Location: [facility];  Service: Orthopedics;  Laterality: Right;  . ANKLE FUSION Right 04/18/2016   Procedure: Right Ankle  Tibiocalcaneal Fusion;  Surgeon: Newt Minion, MD;  Location: [facility];  Service: Orthopedics;  Laterality: Right;  . ANTERIOR CERVICAL DECOMP/DISCECTOMY FUSION N/A 09/26/2013   Procedure: ANTERIOR CERVICAL DISCECTOMY FUSION C3-4, plate and screw fixation, allograft bone graft;  Surgeon: Jessy Oto, MD;  Location: [facility];  Service: Orthopedics;  Laterality: N/A;  . BACK SURGERY    . BELOW KNEE LEG AMPUTATION Right 06/14/2016   right ankle and foot  . CARDIAC CATHETERIZATION  X 1  . CARPAL TUNNEL RELEASE Bilateral   . COLONOSCOPY    . COLONOSCOPY N/A 10/22/2014   Procedure: COLONOSCOPY;  Surgeon: Lafayette Dragon, MD;  Location: [facility] ENDOSCOPY;  Service: Endoscopy;  Laterality: N/A;  . CORONARY ANGIOPLASTY WITH STENT PLACEMENT     "I have 9 stents"  . ESOPHAGOGASTRODUODENOSCOPY N/A 10/19/2014   Procedure: ESOPHAGOGASTRODUODENOSCOPY (EGD);  Surgeon: Jerene Bears, MD;  Location[facility]OSCOPY;  Service: Endoscopy;  Laterality: N/A;  . FRACTURE SURGERY    . FUSION OF TALONAVICULAR JOINT Right 04/15/2014   dr duda  . HERNIA REPAIR     umbilical  . INGUINAL HERNIA REPAIR Right 05/11/2015   Procedure: LAPAROSCOPIC REPAIR RIGHT  INGUINAL HERNIA;  Surgeon: Greer Pickerel, MD;  Location: [facility];  Service: General;  Laterality: Right;  . INSERTION OF MESH Right 05/11/2015   Procedure: INSERTION OF MESH;  Surgeon: Greer Pickerel, MD;  Location: [facility];  Service: General;  Laterality: Right;  . JOINT REPLACEMENT    . KNEE CARTILAGE SURGERY Right X 12   "~ 1/2 open; ~ 1/2 scopes"  . KNEE CARTILAGE SURGERY Left X 3   "3 scopes"  . LAPAROSCOPIC GASTRIC SLEEVE RESECTION N/A 03/03/2019   Procedure: LAPAROSCOPIC GASTRIC SLEEVE RESECTION, Upper Endo, ERAS Pathway;  Surgeon: Greer Pickerel, MD;  Location: [facility];  Service: General;  Laterality: N/A;  . LEFT HEART CATHETERIZATION WITH CORONARY ANGIOGRAM N/A 02/10/2013   Procedure: LEFT HEART CATHETERIZATION WITH CORONARY ANGIOGRAM;  Surgeon: Burnell Blanks, MD;   Location: [facility] CATH LAB;  Service: Cardiovascular;  Laterality: N/A;  . LUMBAR LAMINECTOMY N/A 07/16/2018   Procedure: LEFT L4-5 REDO PARTIAL LUMBAR HEMILAMINECTOMY WITH FORAMINOTOMY LEFT L4;  Surgeon: Jessy Oto, MD;  Location: [facility];  Service: Orthopedics;  Laterality: N/A;  . LUMBAR LAMINECTOMY/DECOMPRESSION MICRODISCECTOMY N/A 01/27/2014   Procedure: CENTRAL LUMBAR LAMINECTOMY L4-5 AND L3-4;  Surgeon: Jessy Oto, MD;  Location: [facility];  Service: Orthopedics;  Laterality: N/A;  . ORIF ANKLE FRACTURE Right 09/02/2015   Procedure: OPEN REDUCTION INTERNAL FIXATION (ORIF) ANKLE FRACTURE;  Surgeon: Newt Minion, MD;  Location: [facility];  Service: Orthopedics;  Laterality: Right;  . PERIPHERALLY INSERTED CENTRAL CATHETER INSERTION  09/02/2015  . POLYPECTOMY    . SHOULDER  ARTHROSCOPY W/ ROTATOR CUFF REPAIR Bilateral    "3 on the right; 1 on the left"  . SKIN SPLIT GRAFT Right 10/01/2015   Procedure: RIGHT ANKLE APPLY SKIN GRAFT SPLIT THICKNESS;  Surgeon: Newt Minion, MD;  Location: [facility];  Service: Orthopedics;  Laterality: Right;  . TONSILLECTOMY    . TOOTH EXTRACTION    . TOTAL HIP ARTHROPLASTY Right 04/17/2018  . TOTAL HIP ARTHROPLASTY Right 04/17/2018   Procedure: RIGHT TOTAL HIP ARTHROPLASTY;  Surgeon: Newt Minion, MD;  Location: [facility];  Service: Orthopedics;  Laterality: Right;  . TOTAL KNEE ARTHROPLASTY Bilateral 2006/08/01  . UMBILICAL HERNIA REPAIR     UHR  . UPPER GASTROINTESTINAL ENDOSCOPY    . URETHRAL DILATION  X 4  . VASECTOMY    . WISDOM TOOTH EXTRACTION      Current Facility-Administered Medications  Medication Dose Route Frequency Provider Last Rate Last Admin  . ceFAZolin (ANCEF) 3 g in dextrose 5 % 50 mL IVPB  3 g Intravenous To OR Mcarthur Rossetti, MD      . chlorhexidine (HIBICLENS) 4 % liquid 4 application  60 mL Topical Once Pete Pelt, PA-C      . lactated ringers infusion   Intravenous Continuous Lynda Rainwater, MD 10 mL/hr at 06/06/19 1007 New Bag at  06/06/19 1007  . tranexamic acid (CYKLOKAPRON) IVPB 1,000 mg  1,000 mg Intravenous To OR Pete Pelt, PA-C       No Known Allergies  Social History   Tobacco Use  . Smoking status: Former Smoker    Types: Cigars    Quit date: 08/28/2010    Years since quitting: 8.7  . Smokeless tobacco: Never Used  . Tobacco comment: 04/18/2016 "smoked 1 cigar/wk when I did smoke"  Substance Use Topics  . Alcohol use: Not Currently    Alcohol/week: 0.0 standard drinks    Comment: rarely    Family History  Problem Relation Age of Onset  . Depression Mother   . Heart disease Mother   . Hypertension Mother   . Breast cancer Mother   . Cancer Mother   . Diabetes Father   . Heart disease Father        CABG  . Hypertension Father   . Hyperlipidemia Father   . Prostate cancer Father   . Skin cancer Father   . Depression Brother        x 2  . Hypertension Brother        x2  . Healthy Son   . Heart disease Maternal Grandfather   . Early death Maternal Grandfather   . Heart attack Maternal Grandfather [id number]  . Early death Paternal Grandfather   . Coronary artery disease Other   . Hypertension Other   . Depression Other   . Healthy Son   . Colon cancer Neg Hx   . Colon polyps Neg Hx   . Esophageal cancer Neg Hx   . Rectal cancer Neg Hx   . Stomach cancer Neg Hx      Review of Systems  All other systems reviewed and are negative.   Objective:  Physical Exam  Constitutional: He is oriented to person, place, and time. He appears well-developed and well-nourished.  HENT:  Head: Normocephalic and atraumatic.  Eyes: Pupils are equal, round, and reactive to light. EOM are normal.  Cardiovascular: Normal rate and regular rhythm.  Respiratory: Effort normal and breath sounds normal.  GI: Soft. Bowel sounds are normal.  Musculoskeletal:     Cervical back: Normal range of motion and neck supple.     Left hip: Tenderness and bony tenderness present. Decreased range of motion. Decreased  strength.  Neurological: He is alert and oriented to person, place, and time.  Skin: Skin is warm and dry.  Psychiatric: He has a normal mood and affect.    Vital signs in last 24 hours: Temp:  [98.4 F (36.9 C)] 98.4 F (36.9 C) (01/15 0934) Pulse Rate:  [57] 57 (01/15 0934) BP: (143)/(56) 143/56 (01/15 0934) SpO2:  [97 %] 97 % (01/15 0934) Weight:  [136.1 kg] 136.1 kg (01/15 0934)  Labs:   Estimated body mass index is 40.69 kg/m as calculated from the following:   Height as of this encounter: 6' (1.829 m).   Weight as of this encounter: 136.1 kg.   Imaging Review Plain radiographs demonstrate severe degenerative joint disease of the left hip(s). The bone quality appears to be good for age and reported activity level.      Assessment/Plan:  End stage arthritis, left hip(s)  The patient history, physical examination, clinical judgement of the provider and imaging studies are consistent with end stage degenerative joint disease of the left hip(s) and total hip arthroplasty is deemed medically necessary. The treatment options including medical management, injection therapy, arthroscopy and arthroplasty were discussed at length. The risks and benefits of total hip arthroplasty were presented and reviewed. The risks due to aseptic loosening, infection, stiffness, dislocation/subluxation,  thromboembolic complications and other imponderables were discussed.  The patient acknowledged the explanation, agreed to proceed with the plan and consent was signed. Patient is being admitted for inpatient treatment for surgery, pain control, PT, OT, prophylactic antibiotics, VTE prophylaxis, progressive ambulation and ADL's and discharge planning.The patient is planning to be discharged home with home health services    Patient's anticipated LOS is less than 2 midnights, meeting these requirements: - Younger than 14 - Lives within 1 hour of care - Has a competent adult at home to recover  with post-op recover - NO history of  - Chronic pain requiring opiods  - Diabetes  - Coronary Artery Disease  - Heart failure  - Heart attack  - Stroke  - DVT/VTE  - Cardiac arrhythmia  - Respiratory Failure/COPD  - Renal failure  - Anemia  - Advanced Liver disease

## 2019-06-06 NOTE — Brief Op Note (Signed)
06/06/2019  1:35 PM  PATIENT:  Travis Novak Sr.  69 y.o. male  PRE-OPERATIVE DIAGNOSIS:  osteoarthritis left hip  POST-OPERATIVE DIAGNOSIS:  osteoarthritis left hip  PROCEDURE:  Procedure(s): LEFT TOTAL HIP ARTHROPLASTY ANTERIOR APPROACH (Left)  SURGEON:  Surgeon(s) and Role:    Mcarthur Rossetti, MD - Primary  PHYSICIAN ASSISTANT:  Benita Stabile, PA-C  ANESTHESIA:   spinal  EBL:  500 mL   COUNTS:  YES  DICTATION: .Other Dictation: Dictation Number [id number]  PLAN OF CARE: Admit for overnight observation  PATIENT DISPOSITION:  PACU - hemodynamically stable.   Delay start of Pharmacological VTE agent (>24hrs) due to surgical blood loss or risk of bleeding: yes

## 2019-06-06 NOTE — Transfer of Care (Signed)
Immediate Anesthesia Transfer of Care Note  Patient: Travis MONDAY Sr.  Procedure(s) Performed: LEFT TOTAL HIP ARTHROPLASTY ANTERIOR APPROACH (Left Hip)  Patient Location: PACU  Anesthesia Type:MAC and Spinal  Level of Consciousness: awake, drowsy and patient cooperative  Airway & Oxygen Therapy: Patient Spontanous Breathing and Patient connected to face mask oxygen  Post-op Assessment: Report given to RN and Post -op Vital signs reviewed and stable  Post vital signs: Reviewed and stable  Last Vitals:  Vitals Value Taken Time  BP 107/57 06/06/19 1356  Temp    Pulse 53 06/06/19 1358  Resp 15 06/06/19 1358  SpO2 100 % 06/06/19 1358  Vitals shown include unvalidated device data.  Last Pain:  Vitals:   06/06/19 0955  TempSrc:   PainSc: 8          Complications: No apparent anesthesia complications

## 2019-06-06 NOTE — Op Note (Signed)
NAME: Travis [name], [place] MEDICAL RECORD [id number] ACCOUNT [id number] DATE OF BIRTH:Feb 07, 1951 FACILITY: [facility] LOCATION: [facility] PHYSICIAN:Vedh Ptacek [place], MD  OPERATIVE REPORT  DATE OF PROCEDURE:  06/06/2019  PREOPERATIVE DIAGNOSIS:  Severe end-stage arthritis and degenerative joint disease, left hip.  POSTOPERATIVE DIAGNOSIS:  Severe end-stage arthritis and degenerative joint disease, left hip.  PROCEDURE:  Left total hip arthroplasty through direct anterior approach.  IMPLANTS:  DePuy Sector Gription acetabular component size 56, size 36+4 neutral polyethylene liner, size 9 Actis femoral component with high offset, size 36+5 metal hip ball.  SURGEON:  Lind Guest. Ninfa Linden, MD  ASSISTANT:  Erskine Emery, PA-C.  ANESTHESIA:  Spinal.  ANTIBIOTICS:  Two g IV Ancef.  ESTIMATED BLOOD LOSS:  500 mL.  COMPLICATIONS:  None.  INDICATIONS:  The patient is a 69 year old very nice individual with debilitating arthritis involving his right hip.  He is someone who is morbidly obese, but has had bariatric surgery in just October of this past year.  He has lost 30 pounds.  He does  have a history of bilateral knee replacements.  He has a history of a right hip replacement.  He also has a history of a right below-knee amputation.  His left hip pain has become debilitating and is daily.  His x-ray shows severe end-stage arthritis of  his left hip.  At this point, he has lost weight.  He has tried and failed all forms of conservative treatment.  He does wish to proceed with total hip arthroplasty.  He understands that given his weight, that there is certainly heightened risk of acute  blood loss anemia, nerve or vessel injury, fracture, infection, dislocation, DVT and implant failure.  He understands our goals are to decrease pain, improve mobility and overall improve quality of life.  DESCRIPTION OF PROCEDURE:  After informed consent was obtained and the appropriate left hip was  marked.  He was brought to the operating room and sat up on a stretcher.  Spinal anesthesia was obtained.  He was laid back in supine position on a stretcher.   A traction boot was placed on his left operative leg and he was placed supine on the Hana fracture table with the perineal post in place, his left leg in line skeletal traction.  His right leg we actually placed and just a small well-leg holder with  appropriate padding with him not having his prosthetic leg on.  We then assessed his left hip radiographically and we felt like we had good images of the hip.  We prepped the left hip with DuraPrep and sterile drapes.  A time-out was called.  He was  identified as correct patient, correct left hip.  I then made an incision just inferior and posterior to the anterior superior iliac spine and carried this obliquely down the leg.  We dissected down to the tensor fascia lata muscle.  Tensor fascia was  then divided longitudinally to proceed with direct anterior approach to the hip.  We identified and cauterized circumflex vessels.  We then identified the hip capsule, opened the hip capsule in an L-type format, finding severe disease of his left hip.   We then placed a curved Cobra retractor around the medial and lateral femoral neck and made our femoral neck cut proximal to the lesser trochanter with an oscillating saw and completed this with an osteotome.  We placed a corkscrew guide in the femoral  head and removed the femoral head in its entirety and found a wide area devoid  of cartilage.  I then placed a bent Hohmann over the medial acetabular rim and removed remnants of the acetabular labrum and other debris.  We then began reaming under direct  visualization from a size 44 reamer in stepwise increments going up to a size 55.  With all reamers under direct visualization, the last reamer under direct fluoroscopy so we could obtain our depth of reaming, our inclination and anteversion.  I then  placed the  real DePuy Sector Gription acetabular component size 56 and a 36+4 neutral polyethylene liner for that size acetabular component.  Attention was then turned to the femur.  With the leg externally rotated to 120 degrees, extended and adducted,  we were able to place a Mueller retractor medially and a Hohman retractor behind the greater trochanter.  We released the lateral joint capsule and used a box-cutting osteotome to enter the femoral canal and a rongeur to lateralize.  We then began  broaching using the Actis broaching system from a size zero going all the way to a size 9.  With a size 9 in place, we trialed a standard offset femoral neck and a 36+1.5 hip ball, reduced this in the acetabulum and it was stable on clinical exam and  assessing radiographically, but we definitely knew,  we needed more leg length to equal his other side, as well as the fact that he will put a leveraging through this hip given his obesity.  We dislocated the hip and removed the trial components.  We  then placed the real high offset Actis femoral component size 9 and the real 36+5 metal hip ball, reduced this in the acetabulum and we did appreciate the stability, assessing mechanically and radiographically.  We then irrigated the soft tissue with  normal saline solution using pulsatile lavage.  We were able to close the joint capsule with #1 Ethibond suture.  Zero Vicryl was used to close the deep tissue, 2-0 Vicryl was used to close the subcutaneous tissue and interrupted staples were used to  close the skin.  Xeroform and Aquacel dressing was applied.  He was taken off the Hana table and taken to the recovery room in stable condition.  All final counts were correct.  There were no complications noted.  Of note, Benita Stabile, PA-C, assisted  during the entire case.  His assistance was crucial for facilitating all aspects of this case.  VN/NUANCE  D:06/06/2019 T:06/06/2019 JOB:[id number]

## 2019-06-06 NOTE — Anesthesia Procedure Notes (Signed)
Procedure Name: MAC Date/Time: 06/06/2019 12:15 PM Performed by: Renato Shin, CRNA Pre-anesthesia Checklist: Patient identified, Emergency Drugs available, Suction available and Patient being monitored Patient Re-evaluated:Patient Re-evaluated prior to induction Oxygen Delivery Method: Simple face mask Preoxygenation: Pre-oxygenation with 100% oxygen Induction Type: IV induction Placement Confirmation: positive ETCO2 Dental Injury: Teeth and Oropharynx as per pre-operative assessment

## 2019-06-07 DIAGNOSIS — Z20822 Contact with and (suspected) exposure to covid-19: Secondary | ICD-10-CM | POA: Diagnosis present

## 2019-06-07 DIAGNOSIS — Z9884 Bariatric surgery status: Secondary | ICD-10-CM | POA: Diagnosis not present

## 2019-06-07 DIAGNOSIS — Z6841 Body Mass Index (BMI) 40.0 and over, adult: Secondary | ICD-10-CM | POA: Diagnosis not present

## 2019-06-07 DIAGNOSIS — I2510 Atherosclerotic heart disease of native coronary artery without angina pectoris: Secondary | ICD-10-CM | POA: Diagnosis present

## 2019-06-07 DIAGNOSIS — E785 Hyperlipidemia, unspecified: Secondary | ICD-10-CM | POA: Diagnosis present

## 2019-06-07 DIAGNOSIS — Z87891 Personal history of nicotine dependence: Secondary | ICD-10-CM | POA: Diagnosis not present

## 2019-06-07 DIAGNOSIS — M797 Fibromyalgia: Secondary | ICD-10-CM | POA: Diagnosis present

## 2019-06-07 DIAGNOSIS — K219 Gastro-esophageal reflux disease without esophagitis: Secondary | ICD-10-CM | POA: Diagnosis present

## 2019-06-07 DIAGNOSIS — R7611 Nonspecific reaction to tuberculin skin test without active tuberculosis: Secondary | ICD-10-CM | POA: Diagnosis present

## 2019-06-07 DIAGNOSIS — I10 Essential (primary) hypertension: Secondary | ICD-10-CM | POA: Diagnosis present

## 2019-06-07 DIAGNOSIS — M1612 Unilateral primary osteoarthritis, left hip: Secondary | ICD-10-CM | POA: Diagnosis present

## 2019-06-07 DIAGNOSIS — G4733 Obstructive sleep apnea (adult) (pediatric): Secondary | ICD-10-CM | POA: Diagnosis present

## 2019-06-07 DIAGNOSIS — Z96641 Presence of right artificial hip joint: Secondary | ICD-10-CM | POA: Diagnosis present

## 2019-06-07 DIAGNOSIS — E1151 Type 2 diabetes mellitus with diabetic peripheral angiopathy without gangrene: Secondary | ICD-10-CM | POA: Diagnosis present

## 2019-06-07 DIAGNOSIS — I252 Old myocardial infarction: Secondary | ICD-10-CM | POA: Diagnosis not present

## 2019-06-07 DIAGNOSIS — G894 Chronic pain syndrome: Secondary | ICD-10-CM | POA: Diagnosis present

## 2019-06-07 DIAGNOSIS — Z79890 Hormone replacement therapy (postmenopausal): Secondary | ICD-10-CM | POA: Diagnosis not present

## 2019-06-07 DIAGNOSIS — Z96653 Presence of artificial knee joint, bilateral: Secondary | ICD-10-CM | POA: Diagnosis present

## 2019-06-07 DIAGNOSIS — F329 Major depressive disorder, single episode, unspecified: Secondary | ICD-10-CM | POA: Diagnosis present

## 2019-06-07 DIAGNOSIS — Z9181 History of falling: Secondary | ICD-10-CM | POA: Diagnosis not present

## 2019-06-07 DIAGNOSIS — Z7982 Long term (current) use of aspirin: Secondary | ICD-10-CM | POA: Diagnosis not present

## 2019-06-07 DIAGNOSIS — Z79899 Other long term (current) drug therapy: Secondary | ICD-10-CM | POA: Diagnosis not present

## 2019-06-07 DIAGNOSIS — Z981 Arthrodesis status: Secondary | ICD-10-CM | POA: Diagnosis not present

## 2019-06-07 LAB — BASIC METABOLIC PANEL
Anion gap: 9 (ref 5–15)
BUN: 21 mg/dL (ref 8–23)
CO2: 26 mmol/L (ref 22–32)
Calcium: 8.3 mg/dL — ABNORMAL LOW (ref 8.9–10.3)
Chloride: 101 mmol/L (ref 98–111)
Creatinine, Ser: 1.15 mg/dL (ref 0.61–1.24)
GFR calc Af Amer: >60 mL/min (ref >60)
GFR calc non Af Amer: >60 mL/min (ref >60)
Glucose, Bld: 165 mg/dL — ABNORMAL HIGH (ref 70–99)
Potassium: 3.4 mmol/L — ABNORMAL LOW (ref 3.5–5.1)
Sodium: 136 mmol/L (ref 135–145)

## 2019-06-07 LAB — GLUCOSE, CAPILLARY
Glucose-Capillary: 154 mg/dL — ABNORMAL HIGH (ref 70–99)
Glucose-Capillary: 119 mg/dL — ABNORMAL HIGH (ref 70–99)

## 2019-06-07 LAB — CBC
HCT: 28.7 % — ABNORMAL LOW (ref 39.0–52.0)
Hemoglobin: 9.6 g/dL — ABNORMAL LOW (ref 13.0–17.0)
MCH: 29.5 pg (ref 26.0–34.0)
MCHC: 33.4 g/dL (ref 30.0–36.0)
MCV: 88.3 fL (ref 80.0–100.0)
Platelets: 229 10*3/uL (ref 150–400)
RBC: 3.25 MIL/uL — ABNORMAL LOW (ref 4.22–5.81)
RDW: 14.4 % (ref 11.5–15.5)
WBC: 7.5 10*3/uL (ref 4.0–10.5)
nRBC: 0 % (ref 0.0–0.2)

## 2019-06-07 NOTE — Care Management Obs Status (Signed)
[place] NOTIFICATION   Patient Details  Name: [name] MRN: [id number] Date of Birth: [birth year]   Medicare Observation Status Notification Given:  Yes    Claudie Leach, RN 06/07/2019, 10:02 AM

## 2019-06-07 NOTE — Evaluation (Signed)
Physical Therapy Evaluation Patient Details Name: Travis NICKSON Sr. MRN: [id number] DOB: [birth year] Today's Date: 06/07/2019   History of Present Illness  Pt is a 69 y/o male who presents s/p L THA on 06/06/2019. Extensive PMH significant for DMII, prior spinal surgeries, B rotator cuff repairs, MI, HTN, fibromyalgia, chronic pain syndrome, B TKR, R THR, R BKA with prosthesis.  Clinical Impression  Pt is s/p THA resulting in the deficits listed below (see PT Problem List). At the time of PT evaluation, pt required up to mod assist for basic transfers and safety with mobility. Pt with difficulty donning prosthesis reportedly due to swelling in RLE as he has not had his shrinker on since surgery. Was not able to don it last night per pt but was able to get the prosthesis clicked in with increased effort in standing this morning. Pt also reporting he will have minimal assistance available at home and will be alone at times. He states his walker will not fit through his mobile home, so he will need to be at a [facility] level by d/c. At this time, pt at a high risk for falls and do not feel he is safe to attempt ambulation without a RW. Reached out to MD regarding recommendation for SNF level rehab prior to return home - pt agreeable. Pt will benefit from skilled PT to increase their independence and safety with mobility to allow discharge to the venue listed below.     Follow Up Recommendations Follow surgeon's recommendation for DC plan and follow-up therapies    Equipment Recommendations  None recommended by PT    Recommendations for Other Services       Precautions / Restrictions Precautions Precautions: Fall Precaution Comments: Direct Anterior Approach Restrictions Weight Bearing Restrictions: No      Mobility  Bed Mobility Overal bed mobility: Needs Assistance Bed Mobility: Supine to Sit;Sit to Supine     Supine to sit: Mod assist Sit to supine: Mod assist   General bed mobility  comments: HOB elevated. Mod assist for LLE advancement towards EOB and bed pad to assist with scooting hips out. Mod assist also required for LE elevation back up into bed at end of session. Increased time required for all aspects of bed mobility.   Transfers Overall transfer level: Needs assistance Equipment used: Rolling walker (2 wheeled) Transfers: Sit to/from Stand Sit to Stand: Min assist         General transfer comment: Assist for power-up to full standing position from elevated bed. Pt initially unsafely rising to stand to get prosthesis locked into place but did not have any overt LOB.   Ambulation/Gait Ambulation/Gait assistance: Min guard;Min assist Gait Distance (Feet): 75 Feet Assistive device: Rolling walker (2 wheeled) Gait Pattern/deviations: Step-to pattern;Decreased stride length;Trunk flexed Gait velocity: Decreased Gait velocity interpretation: <1.8 ft/sec, indicate of risk for recurrent falls General Gait Details: VC's for improved posture, closer walker proximity, and forward gaze. Pt required occasional assist for walker management, especially during turns.   Stairs            Wheelchair Mobility    Modified Rankin (Stroke Patients Only)       Balance Overall balance assessment: Needs assistance Sitting-balance support: No upper extremity supported;Feet supported Sitting balance-Leahy Scale: Fair     Standing balance support: Bilateral upper extremity supported;During functional activity Standing balance-Leahy Scale: Poor Standing balance comment: Reliant on UE support  Pertinent Vitals/Pain Pain Assessment: Faces Faces Pain Scale: Hurts whole lot Pain Location: L hip Pain Descriptors / Indicators: Operative site guarding;Moaning;Grimacing;Aching Pain Intervention(s): Limited activity within patient's tolerance;Monitored during session;Repositioned;Ice applied    Home Living Family/patient expects to  be discharged to:: Private residence Living Arrangements: Other relatives Available Help at Discharge: Family;Available PRN/intermittently Type of Home: Mobile home Home Access: Stairs to enter Entrance Stairs-Rails: Right;Left;Can reach both Entrance Stairs-Number of Steps: 7 Home Layout: One level Home Equipment: Adaptive equipment;Walker - 2 wheels;Toilet riser;Cane - single point Additional Comments: cannot fit walker through his mobile home so will need to be at a [facility] level    Prior Function Level of Independence: Independent with assistive device(s)         Comments: Pt reports he has been having most of his pain after walking for exercise, but otherwise has been independent with SPC around his mobile home. Lives alone, has been managing stairs well.      Hand Dominance   Dominant Hand: Right    Extremity/Trunk Assessment   Upper Extremity Assessment Upper Extremity Assessment: Overall WFL for tasks assessed    Lower Extremity Assessment Lower Extremity Assessment: RLE deficits/detail;LLE deficits/detail RLE Deficits / Details: Prior BKA with prosthesis LLE Deficits / Details: Decreased strength and AROM consistent with THA    Cervical / Trunk Assessment Cervical / Trunk Assessment: Other exceptions Cervical / Trunk Exceptions: Foward head posture with rounded shoulders  Communication   Communication: No difficulties  Cognition Arousal/Alertness: Awake/alert Behavior During Therapy: WFL for tasks assessed/performed Overall Cognitive Status: Within Functional Limits for tasks assessed                                        General Comments      Exercises     Assessment/Plan    PT Assessment Patient needs continued PT services  PT Problem List Decreased strength;Decreased range of motion;Decreased activity tolerance;Decreased balance;Decreased mobility;Decreased knowledge of use of DME;Decreased safety awareness;Decreased knowledge of  precautions;Pain       PT Treatment Interventions DME instruction;Gait training;Stair training;Functional mobility training;Therapeutic activities;Therapeutic exercise;Neuromuscular re-education;Patient/family education    PT Goals (Current goals can be found in the Care Plan section)  Acute Rehab PT Goals Patient Stated Goal: Be able to return home - is open to SNF rehab PT Goal Formulation: With patient Time For Goal Achievement: 06/14/19 Potential to Achieve Goals: Good    Frequency 7X/week   Barriers to discharge Inaccessible home environment;Decreased caregiver support 7 STE, will be alone at times    Co-evaluation               AM-PAC PT "6 Clicks" Mobility  Outcome Measure Help needed turning from your back to your side while in a flat bed without using bedrails?: A Little Help needed moving from lying on your back to sitting on the side of a flat bed without using bedrails?: A Lot Help needed moving to and from a bed to a chair (including a wheelchair)?: A Little Help needed standing up from a chair using your arms (e.g., wheelchair or bedside chair)?: A Little Help needed to walk in hospital room?: A Little Help needed climbing 3-5 steps with a railing? : Total 6 Click Score: 15    End of Session Equipment Utilized During Treatment: Gait belt;Other (comment)(prosthesis) Activity Tolerance: Patient limited by pain;Patient limited by fatigue Patient left: in bed;with call bell/phone  within reach Nurse Communication: Mobility status PT Visit Diagnosis: Unsteadiness on feet (R26.81);Pain Pain - Right/Left: Left Pain - part of body: Hip    Time: 0931-1005 PT Time Calculation (min) (ACUTE ONLY): 34 min   Charges:   PT Evaluation $PT Eval Moderate Complexity: 1 Mod PT Treatments $Gait Training: 8-22 mins        Rolinda Roan, PT, DPT Acute Rehabilitation Services Pager: [phone] Office: [phone]   Thelma Comp 06/07/2019, 1:40 PM

## 2019-06-07 NOTE — Progress Notes (Signed)
Physical Therapy Treatment Patient Details Name: Travis GABA Sr. MRN: [id number] DOB: 1951-04-07 Today's Date: 06/07/2019    History of Present Illness Pt is a 69 y/o male who presents s/p L THA on 06/06/2019. Extensive PMH significant for DMII, prior spinal surgeries, B rotator cuff repairs, MI, HTN, fibromyalgia, chronic pain syndrome, B TKR, R THR, R BKA with prosthesis.    PT Comments    Pt performed gt training and functional mobility with max cues for encouragement.  Pt is very limited due to pain and weakness.  He performed LE exercises with assistance and performed decreased gt with noticeable DOE.  He continues to benefit from short term snf stay to improve mobility before returning home.    Follow Up Recommendations  Follow surgeon's recommendation for DC plan and follow-up therapies     Equipment Recommendations  None recommended by PT    Recommendations for Other Services       Precautions / Restrictions Precautions Precautions: Fall Precaution Comments: Direct Anterior Approach Required Braces or Orthoses: Other Brace Other Brace: R prosthesis Restrictions Weight Bearing Restrictions: No    Mobility  Bed Mobility Overal bed mobility: Needs Assistance Bed Mobility: Supine to Sit     Supine to sit: Mod assist Sit to supine: Mod assist   General bed mobility comments: HOB use of gt belt on LLE to mobilize to edge bed.  Continues to require moderate assistance to move to edge of bed with bed pad.  Increased time to complete task.  Transfers Overall transfer level: Needs assistance Equipment used: Rolling walker (2 wheeled) Transfers: Sit to/from Stand Sit to Stand: From elevated surface;Mod assist         General transfer comment: Assistance to power up into standing from elevated bed height.  Pt required increased standing trial to allow for locking of prosthesis before progressing gt training.  Ambulation/Gait Ambulation/Gait assistance: Min  assist Gait Distance (Feet): 35 Feet Assistive device: Rolling walker (2 wheeled) Gait Pattern/deviations: Step-to pattern;Decreased stride length;Trunk flexed Gait velocity: Decreased Gait velocity interpretation: <1.8 ft/sec, indicate of risk for recurrent falls General Gait Details: VC's for improved posture, closer walker proximity, and forward gaze. Pt required occasional assist for walker management, especially during turns.   Step by step cueing for step to pattern to keep RW still to allow for UE use when in L stance phase.   Stairs             Wheelchair Mobility    Modified Rankin (Stroke Patients Only)       Balance Overall balance assessment: Needs assistance Sitting-balance support: No upper extremity supported;Feet supported Sitting balance-Leahy Scale: Fair     Standing balance support: Bilateral upper extremity supported;During functional activity Standing balance-Leahy Scale: Poor Standing balance comment: Reliant on UE support                            Cognition Arousal/Alertness: Awake/alert Behavior During Therapy: WFL for tasks assessed/performed;Anxious Overall Cognitive Status: Within Functional Limits for tasks assessed                                 General Comments: Pt very anxious to mobilize due to pain.      Exercises Total Joint Exercises Ankle Circles/Pumps: AROM;10 reps;Left;Supine Quad Sets: AROM;Left;10 reps;Supine Heel Slides: AAROM;Left;10 reps;Supine Hip ABduction/ADduction: AAROM;Left;10 reps;Supine    General Comments  Pertinent Vitals/Pain Pain Assessment: Faces Faces Pain Scale: Hurts even more Pain Location: L hip Pain Descriptors / Indicators: Operative site guarding;Moaning;Grimacing;Aching Pain Intervention(s): Monitored during session;Repositioned    Home Living Family/patient expects to be discharged to:: Private residence Living Arrangements: Other relatives Available Help  at Discharge: Family;Available PRN/intermittently Type of Home: Mobile home Home Access: Stairs to enter Entrance Stairs-Rails: Right;Left;Can reach both Home Layout: One level Home Equipment: Adaptive equipment;Walker - 2 wheels;Toilet riser;Cane - single point Additional Comments: cannot fit walker through his mobile home so will need to be at a [facility] level    Prior Function Level of Independence: Independent with assistive device(s)      Comments: Pt reports he has been having most of his pain after walking for exercise, but otherwise has been independent with SPC around his mobile home. Lives alone, has been managing stairs well.    PT Goals (current goals can now be found in the care plan section) Acute Rehab PT Goals Patient Stated Goal: Be able to return home - is open to SNF rehab PT Goal Formulation: With patient Time For Goal Achievement: 06/14/19 Potential to Achieve Goals: Good Progress towards PT goals: Progressing toward goals    Frequency    7X/week      PT Plan Current plan remains appropriate    Co-evaluation              AM-PAC PT "6 Clicks" Mobility   Outcome Measure  Help needed turning from your back to your side while in a flat bed without using bedrails?: A Lot Help needed moving from lying on your back to sitting on the side of a flat bed without using bedrails?: A Lot Help needed moving to and from a bed to a chair (including a wheelchair)?: A Lot Help needed standing up from a chair using your arms (e.g., wheelchair or bedside chair)?: A Lot Help needed to walk in hospital room?: A Little Help needed climbing 3-5 steps with a railing? : Total 6 Click Score: 12    End of Session Equipment Utilized During Treatment: Gait belt Activity Tolerance: Patient limited by pain;Patient limited by fatigue Patient left: in chair;with call bell/phone within reach Nurse Communication: Mobility status(documented on white board to move pt back to bed with  sara stedy.) PT Visit Diagnosis: Unsteadiness on feet (R26.81);Pain Pain - Right/Left: Left Pain - part of body: Hip     Time: 8003-4917 PT Time Calculation (min) (ACUTE ONLY): 31 min  Charges:  $Gait Training: 8-22 mins $Therapeutic Exercise: 8-22 mins                     Erasmo Leventhal , PTA Acute Rehabilitation Services Pager [phone] Office [phone]     Hassen Bruun Eli Hose 06/07/2019, 3:31 PM

## 2019-06-07 NOTE — Progress Notes (Signed)
Subjective: 1 Day Post-Op Procedure(s) (LRB): LEFT TOTAL HIP ARTHROPLASTY ANTERIOR APPROACH (Left) Patient reports pain as moderate.  Struggled with therapy today with his mobility.  PT is appropriately recommending likely short-term SNF placement.  Objective: Vital signs in last 24 hours: Temp:  [97.8 F (36.6 C)-102.4 F (39.1 C)] 99.6 F (37.6 C) (01/16 0732) Pulse Rate:  [45-137] 90 (01/16 0732) Resp:  [12-20] 18 (01/16 0732) BP: (102-138)/(51-69) 102/51 (01/16 0732) SpO2:  [93 %-100 %] 94 % (01/16 0732)  Intake/Output from previous day: 01/15 0701 - 01/16 0700 In: 1250 [I.V.:1000; IV Piggyback:250] Out: 1700 [Urine:1200; Blood:500] Intake/Output this shift: Total I/O In: 240 [P.O.:240] Out: -   Recent Labs    06/07/19 0918  HGB 9.6*   Recent Labs    06/07/19 0918  WBC 7.5  RBC 3.25*  HCT 28.7*  PLT 229   Recent Labs    06/07/19 0918  NA 136  K 3.4*  CL 101  CO2 26  BUN 21  CREATININE 1.15  GLUCOSE 165*  CALCIUM 8.3*   No results for input(s): LABPT, INR in the last 72 hours.  Sensation intact distally Intact pulses distally Dorsiflexion/Plantar flexion intact Incision: scant drainage   Assessment/Plan: 1 Day Post-Op Procedure(s) (LRB): LEFT TOTAL HIP ARTHROPLASTY ANTERIOR APPROACH (Left) Up with therapy Discharge to SNF early next week. Will change to Inpatient Admission and consult Transitional Care for SNF placement      Mcarthur Rossetti 06/07/2019, 11:18 AM

## 2019-06-08 LAB — CBC
HCT: 29.3 % — ABNORMAL LOW (ref 39.0–52.0)
Hemoglobin: 9.7 g/dL — ABNORMAL LOW (ref 13.0–17.0)
MCH: 29.7 pg (ref 26.0–34.0)
MCHC: 33.1 g/dL (ref 30.0–36.0)
MCV: 89.6 fL (ref 80.0–100.0)
Platelets: 227 10*3/uL (ref 150–400)
RBC: 3.27 MIL/uL — ABNORMAL LOW (ref 4.22–5.81)
RDW: 14.2 % (ref 11.5–15.5)
WBC: 7.6 10*3/uL (ref 4.0–10.5)
nRBC: 0 % (ref 0.0–0.2)

## 2019-06-08 NOTE — TOC Initial Note (Addendum)
Transition of Care (TOC) - Initial/Assessment Note    Patient Details  Name: Travis SALEHI Sr. MRN: [id number] Date of Birth: 1950-12-24  Transition of Care [facility]) CM/SW Contact:    Claudie Leach, RN 06/08/2019, 12:25 PM  Clinical Narrative:                 Patient has not recovered as well as anticipated and now needs SNF.  Patient lives with siblings and nephews, who are teenagers.  He states they are not available 24/7 and are not "reliable".    Patient has stayed at [facility] in the past and would like to return.  He states if they cannot accept him, he would like to consider any other local SNF.  He would like his information sent out to all available SNFs and will make a decision based on offered beds.    Expected Discharge Plan: Skilled Nursing Facility Barriers to Discharge: SNF Pending bed offer   Patient Goals and CMS Choice Patient states their goals for this hospitalization and ongoing recovery are:: to get back home CMS Medicare.gov Compare Post Acute Care list provided to:: Patient Choice offered to / list presented to : Patient  Expected Discharge Plan and Services Expected Discharge Plan: [place]   Discharge Planning Services: CM Consult Post Acute Care Choice: [place] Living arrangements for the past 2 months: Single Family Home                           HH Arranged: PT [place]: [facility] [employer] Date [place]: 06/06/19 Time [place]: [place] Representative spoke with at [place]: [place]  Prior Living Arrangements/Services Living arrangements for the past 2 months: [place] with:: Minor Children, Relatives Patient language and need for interpreter reviewed:: Yes Do you feel safe going back to the place where you live?: Yes      Need for Family Participation in Patient Care: Yes (Comment) Care giver support system in place?: No (comment)    Criminal Activity/Legal Involvement Pertinent to Current Situation/Hospitalization: No - Comment as needed   Permission Sought/Granted Permission sought to share information with : Chartered certified accountant granted to share information with : Yes, Verbal Permission Granted     Permission granted to share info w AGENCY: [facility] SNFs        Emotional Assessment Appearance:: Appears older than stated age Attitude/Demeanor/Rapport: Gracious Affect (typically observed): Accepting Orientation: : Oriented to Self, Oriented to Place, Oriented to  Time, Oriented to Situation Alcohol / Substance Use: Not Applicable Psych Involvement: No (comment)  Admission diagnosis:  Status post total replacement of left hip [Z96.642] Patient Active Problem List   Diagnosis Date Noted  . Unilateral primary osteoarthritis, left hip 06/06/2019  . Status post total replacement of left hip 06/06/2019  . S/P laparoscopic sleeve gastrectomy 03/03/2019  . Rash 01/24/2019  . Fever 08/15/2018  . UTI (urinary tract infection) 08/15/2018  . Atypical chest pain 08/15/2018  . Fall at home, initial encounter 08/15/2018  . Chronic anemia 08/15/2018  . Hypokalemia 08/15/2018  . Bowel incontinence 07/25/2018  . Other spondylosis with radiculopathy, lumbar region 07/16/2018    Class: Chronic  . Status post lumbar laminectomy 07/16/2018  . Status post THR (total hip replacement) 04/17/2018  . Unilateral primary osteoarthritis, right hip   . Morbid obesity with BMI of 40.0-44.9, adult ([place]) 02/28/2018  . Myocardial  infarction ([place]) 02/25/2018  . Coronary artery disease involving native coronary artery of native heart 02/25/2018  . PAD (peripheral artery disease) ([place]) 02/25/2018  . S/P BKA (below knee amputation) unilateral, right ([place]) 02/25/2018  . Sacroiliitis ([place]) 02/25/2018  . SOB (shortness of breath) 01/03/2018  . Acute on chronic heart failure ([place]) 01/03/2018  . Severe right  groin pain 12/20/2017  . Morbid obesity ([place]) 10/09/2017  . Exposure to sexually transmitted disease (STD) 07/14/2017  . Low back pain 07/13/2017  . Leukoplakia, tongue 01/26/2017  . Acute sinus infection 10/20/2016  . Hyperglycemia 10/20/2016  . Skin lesion 10/20/2016  . S/P unilateral BKA (below knee amputation), right ([place]) 06/14/2016  . Charcot foot due to diabetes mellitus ([place])   . Charcot's arthropathy associated with type 2 diabetes mellitus ([place]) 04/11/2016  . Preventative health care 11/05/2015  . Major depression 09/13/2015  . S/P TKR (total knee replacement) bilaterally 09/13/2015  . GERD (gastroesophageal reflux disease) 09/08/2015  . S/P laparoscopic hernia repair 05/11/2015  . PPD positive 04/08/2015  . Benign neoplasm of descending colon   . Benign neoplasm of cecum   . Acute blood loss anemia   . Chronic anticoagulation   . Occult blood positive stool 10/17/2014  . General weakness 07/14/2014  . Urinary incontinence 07/14/2014  . Headache(784.0) 10/15/2013  . Spinal stenosis in cervical region 09/26/2013    Class: Chronic  . Spinal stenosis of lumbar region 09/26/2013    Class: Chronic  . Hand joint pain 06/10/2013  . Rotator cuff tear arthropathy of both shoulders 06/10/2013  . Skin lesion of cheek 05/01/2013  . Pain of right thumb 04/03/2013  . Balance disorder 03/12/2013  . Gait disorder 03/12/2013  . Tremor 03/12/2013  . Left hip pain 03/12/2013  . Pre-ulcerative corn or callous 02/06/2013  . Anxiety 11/12/2011  . OSA on CPAP 11/07/2011  . Bradycardia 10/20/2011  . Insomnia 10/04/2011  . Obesity 01/12/2011  . Diabetes mellitus type 2 in obese ([place]) 09/27/2010  . ERECTILE DYSFUNCTION, ORGANIC 05/30/2010  . [place] DISEASE, LUMBAR 04/19/2010  . SCIATICA, LEFT 04/19/2010  . Chronic pain syndrome 10/27/2009  . Hyperlipidemia 07/15/2009  . Essential hypertension 06/24/2009  . Coronary artery disease involving native coronary artery of native heart  without angina pectoris 06/24/2009  . Allergic rhinitis 06/24/2009  . URETHRAL STRICTURE 06/24/2009  . DEGENERATIVE JOINT DISEASE 06/24/2009  . SHOULDER PAIN, BILATERAL 06/24/2009  . FATIGUE 06/24/2009  . NEPHROLITHIASIS, HX OF 06/24/2009   PCP:  [name], MD Pharmacy:   [facility], [place] [address] Phone: [phone] Fax: [phone]

## 2019-06-08 NOTE — NC FL2 (Signed)
[place] LEVEL OF CARE SCREENING TOOL     IDENTIFICATION  Patient Name: Travis SCHWIEGER Sr. Birthdate: [birth year] Sex: male Admission Date (Current Location): 06/06/2019  [place] and [place] Number:  Travis [name] [id number] [place] and Address:  [facility] [place] [address] [place], [place] [address]      Provider Number: [id number]  Attending Physician Name and Address:  Travis [name]  Relative Name and Phone Number:  Travis [name] [phone]    Current Level of Care: Hospital Recommended Level of Care: [place] Prior Approval Number:    Date Approved/Denied: 09/27/13 PASRR Number: [id number]  Discharge Plan: Home    Current Diagnoses: Patient Active Problem List   Diagnosis Date Noted  . Unilateral primary osteoarthritis, left hip 06/06/2019  . Status post total replacement of left hip 06/06/2019  . S/P laparoscopic sleeve gastrectomy 03/03/2019  . Rash 01/24/2019  . Fever 08/15/2018  . UTI (urinary tract infection) 08/15/2018  . Atypical chest pain 08/15/2018  . Fall at home, initial encounter 08/15/2018  . Chronic anemia 08/15/2018  . Hypokalemia 08/15/2018  . Bowel incontinence 07/25/2018  . Other spondylosis with radiculopathy, lumbar region 07/16/2018  . Status post lumbar laminectomy 07/16/2018  . Status post THR (total hip replacement) 04/17/2018  . Unilateral primary osteoarthritis, right hip   . Morbid obesity with BMI of 40.0-44.9, adult ([place]) 02/28/2018  . Myocardial infarction ([place]) 02/25/2018  . Coronary artery disease involving native coronary artery of native heart 02/25/2018  . PAD (peripheral artery disease) ([place]) 02/25/2018  . S/P BKA (below knee amputation) unilateral, right ([place]) 02/25/2018  . Sacroiliitis ([place]) 02/25/2018  . SOB (shortness of breath) 01/03/2018  . Acute on chronic heart failure ([place]) 01/03/2018  . Severe right groin pain 12/20/2017  .  Morbid obesity ([place]) 10/09/2017  . Exposure to sexually transmitted disease (STD) 07/14/2017  . Low back pain 07/13/2017  . Leukoplakia, tongue 01/26/2017  . Acute sinus infection 10/20/2016  . Hyperglycemia 10/20/2016  . Skin lesion 10/20/2016  . S/P unilateral BKA (below knee amputation), right ([place]) 06/14/2016  . Charcot foot due to diabetes mellitus ([place])   . Charcot's arthropathy associated with type 2 diabetes mellitus ([place]) 04/11/2016  . Preventative health care 11/05/2015  . Major depression 09/13/2015  . S/P TKR (total knee replacement) bilaterally 09/13/2015  . GERD (gastroesophageal reflux disease) 09/08/2015  . S/P laparoscopic hernia repair 05/11/2015  . PPD positive 04/08/2015  . Benign neoplasm of descending colon   . Benign neoplasm of cecum   . Acute blood loss anemia   . Chronic anticoagulation   . Occult blood positive stool 10/17/2014  . General weakness 07/14/2014  . Urinary incontinence 07/14/2014  . Headache(784.0) 10/15/2013  . Spinal stenosis in cervical region 09/26/2013  . Spinal stenosis of lumbar region 09/26/2013  . Hand joint pain 06/10/2013  . Rotator cuff tear arthropathy of both shoulders 06/10/2013  . Skin lesion of cheek 05/01/2013  . Pain of right thumb 04/03/2013  . Balance disorder 03/12/2013  . Gait disorder 03/12/2013  . Tremor 03/12/2013  . Left hip pain 03/12/2013  . Pre-ulcerative corn or callous 02/06/2013  . Anxiety 11/12/2011  . OSA on CPAP 11/07/2011  . Bradycardia 10/20/2011  . Insomnia 10/04/2011  . Obesity 01/12/2011  . Diabetes mellitus type 2 in obese ([place]) 09/27/2010  . ERECTILE DYSFUNCTION, ORGANIC 05/30/2010  . [place] DISEASE, LUMBAR 04/19/2010  . SCIATICA, LEFT 04/19/2010  . Chronic pain syndrome 10/27/2009  . Hyperlipidemia 07/15/2009  .  Essential hypertension 06/24/2009  . Coronary artery disease involving native coronary artery of native heart without angina pectoris 06/24/2009  . Allergic rhinitis 06/24/2009   . URETHRAL STRICTURE 06/24/2009  . DEGENERATIVE JOINT DISEASE 06/24/2009  . SHOULDER PAIN, BILATERAL 06/24/2009  . FATIGUE 06/24/2009  . NEPHROLITHIASIS, HX OF 06/24/2009    Orientation RESPIRATION BLADDER Height & Weight     Self, Time, Situation, Place  Normal Continent Weight: 136.1 kg Height:  6' (182.9 cm)  BEHAVIORAL SYMPTOMS/MOOD NEUROLOGICAL BOWEL NUTRITION STATUS      Continent Diet  AMBULATORY STATUS COMMUNICATION OF NEEDS Skin   Extensive Assist Verbally Surgical wounds                       Personal Care Assistance Level of Assistance  Bathing, Dressing Bathing Assistance: Limited assistance   Dressing Assistance: Limited assistance     Functional Limitations Info  Sight, Hearing, Speech Sight Info: Adequate Hearing Info: Adequate Speech Info: Adequate    SPECIAL CARE FACTORS FREQUENCY  PT (By licensed PT), OT (By licensed OT)     PT Frequency: 5x per week OT Frequency: 5x per week            Contractures Contractures Info: Not present    Additional Factors Info  Allergies, Code Status, Psychotropic(see d/c summary; medications for depression) Code Status Info: Full Code Allergies Info: NKDA           Current Medications (06/08/2019):  This is the current hospital active medication list Current Facility-Administered Medications  Medication Dose Route Frequency Provider Last Rate Last Admin  . 0.9 %  sodium chloride infusion   Intravenous Continuous Travis Rossetti, MD 75 mL/hr at 06/06/19 1553 New Bag at 06/06/19 1553  . acetaminophen (TYLENOL) tablet 325-650 mg  325-650 mg Oral Q6H PRN Travis Rossetti, MD   650 mg at 06/08/19 1507  . ARIPiprazole (ABILIFY) tablet 5 mg  5 mg Oral QHS Travis Rossetti, MD   5 mg at 06/07/19 2134  . aspirin chewable tablet 81 mg  81 mg Oral BID Travis Rossetti, MD   81 mg at 06/08/19 0949  . atorvastatin (LIPITOR) tablet 20 mg  20 mg Oral q1800 Travis Rossetti, MD   20  mg at 06/07/19 1729  . benztropine (COGENTIN) tablet 0.5 mg  0.5 mg Oral QHS Travis Rossetti, MD   0.5 mg at 06/07/19 2209  . calcium carbonate (TUMS - dosed in mg elemental calcium) chewable tablet 1,000 mg  1,000 mg Oral QHS Travis Rossetti, MD   1,000 mg at 06/07/19 2134  . clonazePAM (KLONOPIN) tablet 0.5 mg  0.5 mg Oral QHS Travis Rossetti, MD   0.5 mg at 06/07/19 2134  . diphenhydrAMINE (BENADRYL) 12.5 MG/5ML elixir 12.5-25 mg  12.5-25 mg Oral Q4H PRN Travis Rossetti, MD      . docusate sodium (COLACE) capsule 100 mg  100 mg Oral BID Travis Rossetti, MD   100 mg at 06/08/19 0949  . DULoxetine (CYMBALTA) DR capsule 30 mg  30 mg Oral BID Travis Rossetti, MD   30 mg at 06/08/19 0949  . gabapentin (NEURONTIN) capsule 800 mg  800 mg Oral TID Travis Rossetti, MD   800 mg at 06/08/19 1507  . hydrochlorothiazide (HYDRODIURIL) tablet 25 mg  25 mg Oral QHS Travis Rossetti, MD   25 mg at 06/07/19 2133  . HYDROmorphone (DILAUDID) injection 0.5-1 mg  0.5-1 mg Intravenous Q2H  PRN Travis Rossetti, MD   1 mg at 06/06/19 2250  . lipase/protease/amylase (CREON) capsule 12,000 Units  12,000 Units Oral With snacks Travis Rossetti, MD   12,000 Units at 06/08/19 1417  . lipase/protease/amylase (CREON) capsule 36,000 Units  36,000 Units Oral TID WC Travis Rossetti, MD   36,000 Units at 06/08/19 1229  . menthol-cetylpyridinium (CEPACOL) lozenge 3 mg  1 lozenge Oral PRN Travis Rossetti, MD       Or  . phenol (CHLORASEPTIC) mouth spray 1 spray  1 spray Mouth/Throat PRN Travis Rossetti, MD      . methocarbamol (ROBAXIN) tablet 500 mg  500 mg Oral Q6H PRN Travis Rossetti, MD   500 mg at 06/08/19 1507   Or  . methocarbamol (ROBAXIN) 500 mg in dextrose 5 % 50 mL IVPB  500 mg Intravenous Q6H PRN Travis Rossetti, MD      . metoCLOPramide (REGLAN) tablet 5-10 mg  5-10 mg Oral Q8H PRN Travis Rossetti, MD       Or  . metoCLOPramide (REGLAN) injection 5-10 mg  5-10 mg Intravenous Q8H PRN Travis Rossetti, MD      . mirabegron [facility]) tablet 50 mg  50 mg Oral QHS Travis Rossetti, MD   50 mg at 06/07/19 2209  . ondansetron (ZOFRAN) tablet 4 mg  4 mg Oral Q6H PRN Travis Rossetti, MD       Or  . ondansetron [facility]) injection 4 mg  4 mg Intravenous Q6H PRN Travis Rossetti, MD      . oxyCODONE (Oxy IR/ROXICODONE) immediate release tablet 10-15 mg  10-15 mg Oral Q4H PRN Travis Rossetti, MD   15 mg at 06/08/19 1327  . oxyCODONE (Oxy IR/ROXICODONE) immediate release tablet 5-10 mg  5-10 mg Oral Q4H PRN Travis Rossetti, MD   10 mg at 06/07/19 1350  . pantoprazole (PROTONIX) EC tablet 40 mg  40 mg Oral Daily Travis Rossetti, MD   40 mg at 06/08/19 0949  . prasugrel (EFFIENT) tablet 5 mg  5 mg Oral QHS Travis Rossetti, MD   5 mg at 06/07/19 2256     Discharge Medications: Please see discharge summary for a list of discharge medications.  Relevant Imaging Results:  Relevant Lab Results:   Additional Information SSN: [id number]  Claudie Leach, RN

## 2019-06-08 NOTE — Plan of Care (Signed)
  Problem: Clinical Measurements: Goal: Postoperative complications will be avoided or minimized Outcome: Progressing

## 2019-06-08 NOTE — Progress Notes (Signed)
Physical Therapy Treatment Patient Details Name: Travis BUTCHER Sr. MRN: [id number] DOB: 1950/09/17 Today's Date: 06/08/2019    History of Present Illness Pt is a 69 y/o male who presents s/p L THA on 06/06/2019. Extensive PMH significant for DMII, prior spinal surgeries, B rotator cuff repairs, MI, HTN, fibromyalgia, chronic pain syndrome, B TKR, R THR, R BKA with prosthesis.    PT Comments    When PT arrived, pt back in bed and reports significant pain. RN provided pain meds during session. Noted LLE red and warm to the touch, RN notified of this as well when in to administer meds. Pt not able to tolerate OOB mobility this afternoon, however was willing to participate in therapeutic exercise. Continue to recommend SNF level rehab at d/c. Will continue to follow.    Follow Up Recommendations  Follow surgeon's recommendation for DC plan and follow-up therapies     Equipment Recommendations  None recommended by PT    Recommendations for Other Services       Precautions / Restrictions Precautions Precautions: Fall Precaution Comments: Direct Anterior Approach Required Braces or Orthoses: Other Brace Other Brace: R prosthesis Restrictions Weight Bearing Restrictions: No    Mobility  Bed Mobility               General bed mobility comments: Pt was able to assist with scooting himself up in bed utilizing headboard to pull on. Noted pt assisting RUE back to reach for headboard but was able to get LUE back without difficulty.   Transfers                    Ambulation/Gait                 Stairs             Wheelchair Mobility    Modified Rankin (Stroke Patients Only)       Balance                                       Cognition Arousal/Alertness: Awake/alert Behavior During Therapy: WFL for tasks assessed/performed Overall Cognitive Status: Within Functional Limits for tasks assessed                                         Exercises Total Joint Exercises Ankle Circles/Pumps: AROM;Left;Supine;20 reps Quad Sets: AROM;Left;Supine;15 reps Towel Squeeze: Both;10 reps(Isometric) Short Arc QuadSinclair [name];Left;Supine;10 reps Heel Slides: AAROM;Left;10 reps;Supine    General Comments        Pertinent Vitals/Pain Pain Assessment: 0-10 Pain Score: 8  Pain Location: L hip Pain Descriptors / Indicators: Operative site guarding;Moaning;Grimacing;Burning;Spasm Pain Intervention(s): Limited activity within patient's tolerance;Monitored during session;Repositioned;RN gave pain meds during session;Ice applied    Home Living                      Prior Function            PT Goals (current goals can now be found in the care plan section) Acute Rehab PT Goals Patient Stated Goal: Be able to return home - is open to SNF rehab PT Goal Formulation: With patient Time For Goal Achievement: 06/14/19 Potential to Achieve Goals: Good Progress towards PT goals: Progressing toward goals    Frequency    7X/week  PT Plan Current plan remains appropriate    Co-evaluation              AM-PAC PT "6 Clicks" Mobility   Outcome Measure  Help needed turning from your back to your side while in a flat bed without using bedrails?: A Lot Help needed moving from lying on your back to sitting on the side of a flat bed without using bedrails?: A Lot Help needed moving to and from a bed to a chair (including a wheelchair)?: A Lot Help needed standing up from a chair using your arms (e.g., wheelchair or bedside chair)?: A Lot Help needed to walk in hospital room?: A Little Help needed climbing 3-5 steps with a railing? : Total 6 Click Score: 12    End of Session Equipment Utilized During Treatment: Gait belt Activity Tolerance: Patient limited by pain;Patient limited by fatigue Patient left: in bed;with call bell/phone within reach Nurse Communication: Mobility status PT Visit  Diagnosis: Unsteadiness on feet (R26.81);Pain Pain - Right/Left: Left Pain - part of body: Hip     Time: 7048-8891 PT Time Calculation (min) (ACUTE ONLY): 32 min  Charges:  $Therapeutic Exercise: 23-37 mins                     Rolinda Roan, PT, DPT Acute Rehabilitation Services Pager: [phone] Office: [phone]    Thelma Comp 06/08/2019, 1:54 PM

## 2019-06-08 NOTE — Plan of Care (Signed)
  Problem: Activity: Goal: Ability to avoid complications of mobility impairment will improve Outcome: Progressing Goal: Ability to tolerate increased activity will improve Outcome: Progressing   Problem: Clinical Measurements: Goal: Postoperative complications will be avoided or minimized Outcome: Progressing

## 2019-06-08 NOTE — Progress Notes (Signed)
Physical Therapy Treatment Patient Details Name: Travis [name]. MRN: [id number] DOB: August 08, 1950 Today's Date: 06/08/2019    History of Present Illness Pt is a 68 y/o male who presents s/p L THA on 06/06/2019. Extensive PMH significant for DMII, prior spinal surgeries, B rotator cuff repairs, MI, HTN, fibromyalgia, chronic pain syndrome, B TKR, R THR, R BKA with prosthesis.    PT Comments    Pt progressing slowly towards physical therapy goals -  limited by pain. Noted quad actively spasming during therapeutic exercise, and RN brought in muscle relaxer during session. Despite pain, pt with good rehab effort and attempted all mobility therapist asked of him. Pt is hopeful for SNF placement at d/c, and feel this is reasonable to maximize functional independence and safety prior to return home. Will continue to follow.    Follow Up Recommendations  Follow surgeon's recommendation for DC plan and follow-up therapies     Equipment Recommendations  None recommended by PT    Recommendations for Other Services       Precautions / Restrictions Precautions Precautions: Fall Precaution Comments: Direct Anterior Approach Required Braces or Orthoses: Other Brace Other Brace: R prosthesis Restrictions Weight Bearing Restrictions: No    Mobility  Bed Mobility Overal bed mobility: Needs Assistance Bed Mobility: Supine to Sit     Supine to sit: Mod assist;+2 for physical assistance;HOB elevated;Max assist     General bed mobility comments: Heavy +2 mod assist bordering on +2 max assist for pt to elevate trunk and scoot around to EOB. Pt limited due to pain, requiring assist for LLE support and bed pad use to scoot fully around to EOB.   Transfers Overall transfer level: Needs assistance Equipment used: Rolling walker (2 wheeled) Transfers: Sit to/from Stand Sit to Stand: Min assist;From elevated surface         General transfer comment: Bed height raised significantly to get  prosthesis on and locked into place. Min assist to power-up into full standing.   Ambulation/Gait Ambulation/Gait assistance: Min assist Gait Distance (Feet): 6 Feet Assistive device: Rolling walker (2 wheeled) Gait Pattern/deviations: Step-to pattern;Decreased stride length;Trunk flexed Gait velocity: Decreased Gait velocity interpretation: <1.31 ft/sec, indicative of household ambulator General Gait Details: VC's for improved posture, closer walker proximity, and forward gaze. Pt only able to tolerate taking a few steps before requiring chair to be pulled up behind him for seated rest due to pain.    Stairs             Wheelchair Mobility    Modified Rankin (Stroke Patients Only)       Balance Overall balance assessment: Needs assistance Sitting-balance support: No upper extremity supported;Feet supported Sitting balance-Leahy Scale: Fair     Standing balance support: Bilateral upper extremity supported;During functional activity Standing balance-Leahy Scale: Poor Standing balance comment: Reliant on UE support                            Cognition Arousal/Alertness: Awake/alert Behavior During Therapy: WFL for tasks assessed/performed Overall Cognitive Status: Within Functional Limits for tasks assessed                                        Exercises Total Joint Exercises Short Arc QuadSinclair Ship;Left;5 reps;Supine Heel Slides: AAROM;Left;10 reps;Supine Hip ABduction/ADduction: AAROM;Left;10 reps;Supine    General Comments  Pertinent Vitals/Pain Pain Assessment: Faces Faces Pain Scale: Hurts whole lot Pain Location: L hip Pain Descriptors / Indicators: Operative site guarding;Moaning;Grimacing;Burning Pain Intervention(s): Limited activity within patient's tolerance;Monitored during session;Repositioned;Ice applied    Home Living                      Prior Function            PT Goals (current goals can now  be found in the care plan section) Acute Rehab PT Goals Patient Stated Goal: Be able to return home - is open to SNF rehab PT Goal Formulation: With patient Time For Goal Achievement: 06/14/19 Potential to Achieve Goals: Good Progress towards PT goals: Progressing toward goals    Frequency    7X/week      PT Plan Current plan remains appropriate    Co-evaluation              AM-PAC PT "6 Clicks" Mobility   Outcome Measure  Help needed turning from your back to your side while in a flat bed without using bedrails?: A Lot Help needed moving from lying on your back to sitting on the side of a flat bed without using bedrails?: A Lot Help needed moving to and from a bed to a chair (including a wheelchair)?: A Lot Help needed standing up from a chair using your arms (e.g., wheelchair or bedside chair)?: A Lot Help needed to walk in hospital room?: A Little Help needed climbing 3-5 steps with a railing? : Total 6 Click Score: 12    End of Session Equipment Utilized During Treatment: Gait belt Activity Tolerance: Patient limited by pain;Patient limited by fatigue Patient left: in chair;with call bell/phone within reach Nurse Communication: Mobility status PT Visit Diagnosis: Unsteadiness on feet (R26.81);Pain Pain - Right/Left: Left Pain - part of body: Hip     Time: 0821-0901 PT Time Calculation (min) (ACUTE ONLY): 40 min  Charges:  $Gait Training: 23-37 mins $Therapeutic Exercise: 8-22 mins                     Rolinda Roan, PT, DPT Acute Rehabilitation Services Pager: [phone] Office: [phone]    Thelma Comp 06/08/2019, 9:24 AM

## 2019-06-09 ENCOUNTER — Encounter: Payer: Self-pay | Admitting: *Deleted

## 2019-06-09 LAB — CBC
HCT: 27.5 % — ABNORMAL LOW (ref 39.0–52.0)
Hemoglobin: 9.3 g/dL — ABNORMAL LOW (ref 13.0–17.0)
MCH: 30.2 pg (ref 26.0–34.0)
MCHC: 33.8 g/dL (ref 30.0–36.0)
MCV: 89.3 fL (ref 80.0–100.0)
Platelets: 252 10*3/uL (ref 150–400)
RBC: 3.08 MIL/uL — ABNORMAL LOW (ref 4.22–5.81)
RDW: 14.1 % (ref 11.5–15.5)
WBC: 6.3 10*3/uL (ref 4.0–10.5)
nRBC: 0 % (ref 0.0–0.2)

## 2019-06-09 NOTE — Plan of Care (Signed)
  Problem: Activity: Goal: Ability to avoid complications of mobility impairment will improve Outcome: Progressing Goal: Ability to tolerate increased activity will improve Outcome: Progressing   Problem: Clinical Measurements: Goal: Postoperative complications will be avoided or minimized Outcome: Progressing

## 2019-06-09 NOTE — Progress Notes (Signed)
Physical Therapy Treatment  Clinical Impression: Pt admitted for below. He stated that he had just been moved back to bed via the [facility] and was in too much pain to complete any additional OOB or EOB activities. Went over total joint exercise sheet today. He continues to have a significant increase in pain with any movement of the L hip, and required assistance to complete L heel slides and L hip abduction/adduction. Continue to recommend SNF for increased independence prior to return home. He would benefit from continued skilled PT intervention in the acute setting to address deficits.    06/09/19 1432  PT Visit Information  Last PT Received On 06/09/19  Assistance Needed +2  History of Present Illness Pt is a 69 y/o male who presents s/p L THA on 06/06/2019. Extensive PMH significant for DMII, prior spinal surgeries, B rotator cuff repairs, MI, HTN, fibromyalgia, chronic pain syndrome, B TKR, R THR, R BKA with prosthesis.  Subjective Data  Patient Stated Goal Be able to return home - is open to SNF rehab  Precautions  Precautions Fall  Precaution Comments Direct Anterior Approach  Required Braces or Orthoses Other Brace  Other Brace R prosthesis  Restrictions  Weight Bearing Restrictions Yes  LLE Weight Bearing WBAT  Pain Assessment  Pain Assessment Faces  Faces Pain Scale 8  Pain Location L hip  Pain Descriptors / Indicators Operative site guarding;Moaning;Grimacing;Burning;Spasm  Pain Intervention(s) Limited activity within patient's tolerance;Monitored during session  Cognition  Arousal/Alertness Awake/alert  Behavior During Therapy [facility] for tasks assessed/performed  Overall Cognitive Status Within Functional Limits for tasks assessed  General Comments pt anxious to mobilize due to pain  Bed Mobility  General bed mobility comments pt declined- states he just got back in bed and was in too much pain to mobilize beyond bed-level exercise  Exercises  Exercises Total Joint  Total  Joint Exercises  Ankle Circles/Pumps AROM;Both;15 reps;Supine  Quad Sets AROM;Both;10 reps;Supine  Heel Slides AROM;Both;10 reps;Supine;AAROM  Hip ABduction/ADduction AROM;AAROM;Both;10 reps;Supine  Gluteal Sets AROM;Both;10 reps;Supine  Short Arc Quad AROM;Left;10 reps;Supine  PT - End of Session  Equipment Utilized During Treatment Gait belt  Activity Tolerance Patient limited by pain;Patient limited by fatigue  Patient left in chair;with call bell/phone within reach  Nurse Communication Mobility status   PT - Assessment/Plan  PT Plan Current plan remains appropriate  PT Visit Diagnosis Unsteadiness on feet (R26.81);Pain  Pain - Right/Left Left  Pain - part of body Hip  PT Frequency (ACUTE ONLY) 7X/week  Follow Up Recommendations Follow surgeon's recommendation for DC plan and follow-up therapies  PT equipment Other (comment) (TBD next venue (PT recommend SNF))  AM-PAC PT "6 Clicks" Mobility Outcome Measure (Version 2)  Help needed turning from your back to your side while in a flat bed without using bedrails? 2  Help needed moving from lying on your back to sitting on the side of a flat bed without using bedrails? 2  Help needed moving to and from a bed to a chair (including a wheelchair)? 3  Help needed standing up from a chair using your arms (e.g., wheelchair or bedside chair)? 3  Help needed to walk in hospital room? 3  Help needed climbing 3-5 steps with a railing?  1  6 Click Score 14  Consider Recommendation of Discharge To: CIR/SNF/LTACH  PT Goal Progression  Progress towards PT goals Progressing toward goals  Acute Rehab PT Goals  PT Goal Formulation With patient  Time For Goal Achievement 06/14/19  Potential to  Achieve Goals Good  PT Time Calculation  PT Start Time (ACUTE ONLY) 1315  PT Stop Time (ACUTE ONLY) 1328  PT Time Calculation (min) (ACUTE ONLY) 13 min   PT Charges: $Therapeutic Exercise 8-22 min   Christel Mormon, SPT

## 2019-06-09 NOTE — Progress Notes (Signed)
Physical Therapy Treatment Patient Details Name: Travis ESTERLINE Sr. MRN: [id number] DOB: 01/12/1951 Today's Date: 06/09/2019    History of Present Illness Pt is a 69 y/o male who presents s/p L THA on 06/06/2019. Extensive PMH significant for DMII, prior spinal surgeries, B rotator cuff repairs, MI, HTN, fibromyalgia, chronic pain syndrome, B TKR, R THR, R BKA with prosthesis.    PT Comments    Pt admitted for above. He demonstrated steady progression of mobility today by increasing his gait distance. He requires mod assist +2 for bed mobility, min assist for transfers, and min guard for gait. He is mostly limited by pain and moves very slowly with activities. For transfers, he needs a very elevated surface to stand from/ sit down on due to L hip pain and R prosthetic. He required cues for safety and hand placement throughout session. Continue to recommend SNF due to current mobility status and lack of available assistance. He is agreeable with this plan. He would benefit from continued skilled acute PT intervention in order to address deficits. Will return later this afternoon as able.  Follow Up Recommendations  Follow surgeon's recommendation for DC plan and follow-up therapies     Equipment Recommendations  None recommended by PT    Recommendations for Other Services       Precautions / Restrictions Precautions Precautions: Fall Precaution Comments: Direct Anterior Approach Required Braces or Orthoses: Other Brace Other Brace: R prosthesis Restrictions Weight Bearing Restrictions: Yes LLE Weight Bearing: Weight bearing as tolerated    Mobility  Bed Mobility Overal bed mobility: Needs Assistance Bed Mobility: Supine to Sit     Supine to sit: Mod assist;+2 for physical assistance;HOB elevated     General bed mobility comments: pt able to scoot himself as needed, but required mod +2 for trunk elevation and B LE management;  Transfers Overall transfer level: Needs  assistance Equipment used: Rolling walker (2 wheeled) Transfers: Sit to/from Stand Sit to Stand: Min assist;From elevated surface         General transfer comment: Bed height raised significantly to get prosthesis on and locked into place. Min assist to power-up into full standing.   Ambulation/Gait Ambulation/Gait assistance: Min guard Gait Distance (Feet): 100 Feet Assistive device: Rolling walker (2 wheeled) Gait Pattern/deviations: Step-to pattern;Decreased stride length;Trunk flexed Gait velocity: Decreased Gait velocity interpretation: <1.31 ft/sec, indicative of household ambulator General Gait Details: cues for improved posture and closer walker proximity; moves VERY slowly but steady throughout ambulation; good safety awareness by knowing his limits   Stairs             Wheelchair Mobility    Modified Rankin (Stroke Patients Only)       Balance Overall balance assessment: Needs assistance Sitting-balance support: No upper extremity supported;Feet supported Sitting balance-Leahy Scale: Fair Sitting balance - Comments: EOB for donning of prosthesis, gait belt; min guard   Standing balance support: Bilateral upper extremity supported;During functional activity Standing balance-Leahy Scale: Poor Standing balance comment: Reliant on UE support                            Cognition Arousal/Alertness: Awake/alert Behavior During Therapy: WFL for tasks assessed/performed Overall Cognitive Status: Within Functional Limits for tasks assessed                                 General Comments: pt anxious to mobilize  due to pain      Exercises      General Comments        Pertinent Vitals/Pain Pain Assessment: Faces Faces Pain Scale: Hurts whole lot Pain Location: L hip Pain Descriptors / Indicators: Operative site guarding;Moaning;Grimacing;Burning;Spasm Pain Intervention(s): Limited activity within patient's tolerance;Monitored  during session;Repositioned    Home Living                      Prior Function            PT Goals (current goals can now be found in the care plan section) Acute Rehab PT Goals Patient Stated Goal: Be able to return home - is open to SNF rehab PT Goal Formulation: With patient Time For Goal Achievement: 06/14/19 Potential to Achieve Goals: Good Progress towards PT goals: Progressing toward goals    Frequency    7X/week      PT Plan Current plan remains appropriate    Co-evaluation              AM-PAC PT "6 Clicks" Mobility   Outcome Measure  Help needed turning from your back to your side while in a flat bed without using bedrails?: A Lot Help needed moving from lying on your back to sitting on the side of a flat bed without using bedrails?: A Lot Help needed moving to and from a bed to a chair (including a wheelchair)?: A Little Help needed standing up from a chair using your arms (e.g., wheelchair or bedside chair)?: A Little Help needed to walk in hospital room?: A Little Help needed climbing 3-5 steps with a railing? : Total 6 Click Score: 14    End of Session Equipment Utilized During Treatment: Gait belt Activity Tolerance: Patient limited by pain;Patient limited by fatigue Patient left: in chair;with call bell/phone within reach Nurse Communication: Mobility status PT Visit Diagnosis: Unsteadiness on feet (R26.81);Pain Pain - Right/Left: Left Pain - part of body: Hip     Time: 2683-4196 PT Time Calculation (min) (ACUTE ONLY): 31 min  Charges:  $Gait Training: 23-37 mins                     Christel Mormon, SPT    [name] 06/09/2019, 1:04 PM

## 2019-06-09 NOTE — Progress Notes (Addendum)
Subjective: 3 Days Post-Op Procedure(s) (LRB): LEFT TOTAL HIP ARTHROPLASTY ANTERIOR APPROACH (Left) Patient reports pain as moderate to severe.  Complaining of left hip and knee pain. Slow progress with PT.  Objective: Vital signs in last 24 hours: Temp:  [98.9 F (37.2 C)-99.1 F (37.3 C)] 99.1 F (37.3 C) (01/18 0316) Pulse Rate:  [77-83] 82 (01/18 0316) Resp:  [16-18] 18 (01/18 0500) BP: (125-137)/(62-69) 137/64 (01/18 0316) SpO2:  [98 %-99 %] 99 % (01/18 0316)  Intake/Output from previous day: 01/17 0701 - 01/18 0700 In: 640 [P.O.:640] Out: -  Intake/Output this shift: No intake/output data recorded.  Recent Labs    06/07/19 0918 06/08/19 0730 06/09/19 0520  HGB 9.6* 9.7* 9.3*   Recent Labs    06/08/19 0730 06/09/19 0520  WBC 7.6 6.3  RBC 3.27* 3.08*  HCT 29.3* 27.5*  PLT 227 252   Recent Labs    06/07/19 0918  NA 136  K 3.4*  CL 101  CO2 26  BUN 21  CREATININE 1.15  GLUCOSE 165*  CALCIUM 8.3*   No results for input(s): LABPT, INR in the last 72 hours.  Left lower extremity: Dorsiflexion/Plantar flexion intact Incision: scant drainage Compartment soft   Assessment/Plan: 3 Days Post-Op Procedure(s) (LRB): LEFT TOTAL HIP ARTHROPLASTY ANTERIOR APPROACH (Left) Up with therapy  Will need SNF placement.  Will see Patient tomorrow to check progress as Dr. Ninfa Linden is out of town.      Travis [name] 06/09/2019, 7:51 AM

## 2019-06-09 NOTE — Plan of Care (Signed)
  Problem: Activity: Goal: Ability to avoid complications of mobility impairment will improve Outcome: Progressing   

## 2019-06-10 LAB — SARS CORONAVIRUS 2 (TAT 6-24 HRS): SARS Coronavirus 2: NEGATIVE

## 2019-06-10 MED ORDER — OXYCODONE HCL 5 MG PO TABS: 5.0000 mg | ORAL_TABLET | ORAL | 0 refills | Status: DC | PRN

## 2019-06-10 MED ORDER — METHOCARBAMOL 500 MG PO TABS: 500.0000 mg | ORAL_TABLET | Freq: Four times a day (QID) | ORAL | 1 refills | Status: DC | PRN

## 2019-06-10 MED ORDER — ASPIRIN 81 MG PO CHEW: 81.0000 mg | CHEWABLE_TABLET | Freq: Two times a day (BID) | ORAL | 0 refills | Status: DC

## 2019-06-10 NOTE — Progress Notes (Addendum)
Physical Therapy Treatment Patient Details Name: Travis CAPSHAW Sr. MRN: [id number] DOB: 1950/07/12 Today's Date: 06/10/2019    History of Present Illness Pt is a 69 y/o male who presents s/p L THA on 06/06/2019. Extensive PMH significant for DMII, prior spinal surgeries, B rotator cuff repairs, MI, HTN, fibromyalgia, chronic pain syndrome, B TKR, R THR, R BKA with prosthesis.    PT Comments    Continuing work on functional mobility and activity tolerance;  Still painful with moving, but noting very good participation, even without optimal timing with pain medication; noting plans for SNF and very much agree with post-acute rehab to maximize independence and safety with mobility and ADLs prior to dc home   Follow Up Recommendations  Follow surgeon's recommendation for DC plan and follow-up therapies     Equipment Recommendations  Other (comment)(TBD next venue (PT recommend SNF))    Recommendations for Other Services       Precautions / Restrictions Precautions Precautions: Fall Precaution Comments: Direct Anterior Approach Required Braces or Orthoses: Other Brace Other Brace: R prosthesis Restrictions Weight Bearing Restrictions: Yes LLE Weight Bearing: Weight bearing as tolerated    Mobility  Bed Mobility Overal bed mobility: Needs Assistance Bed Mobility: Supine to Sit     Supine to sit: Mod assist;+2 for physical assistance;HOB elevated     General bed mobility comments: Good use of bedrails and preposition of LEs in prep for getting up; Heavy mod assist to elevate trunk to sit; tends to valsalva and hold breath, heavy dependence on UEs to prop at initial sit  Transfers Overall transfer level: Needs assistance Equipment used: Rolling walker (2 wheeled) Transfers: Sit to/from Stand Sit to Stand: Mod assist;From elevated surface         General transfer comment: Bed height raised significantly to get prosthesis on and locked into place. Mod assist to power-up  into full standing, and steady during transition of hands from bed to RW  Ambulation/Gait Ambulation/Gait assistance: Min guard;+2 safety/equipment(Chair push for safety) Gait Distance (Feet): 110 Feet Assistive device: Rolling walker (2 wheeled) Gait Pattern/deviations: Step-to pattern;Decreased stride length;Trunk flexed     General Gait Details: cues for improved posture and closer walker proximity; moves VERY slowly but steady throughout ambulation; good safety awareness by knowing his limits   Stairs             Wheelchair Mobility    Modified Rankin (Stroke Patients Only)       Balance     Sitting balance-Leahy Scale: Fair       Standing balance-Leahy Scale: Poor Standing balance comment: Reliant on UE support                            Cognition Arousal/Alertness: Awake/alert Behavior During Therapy: WFL for tasks assessed/performed Overall Cognitive Status: Within Functional Limits for tasks assessed                                        Exercises      General Comments General comments (skin integrity, edema, etc.): Very sweaty and itchy today; Assisted pt with changing out of sweaty clothes      Pertinent Vitals/Pain Pain Assessment: 0-10 Pain Score: 8  Pain Location: L hip Pain Descriptors / Indicators: Operative site guarding;Moaning;Grimacing;Burning;Spasm Pain Intervention(s): Limited activity within patient's tolerance    Home Living  Prior Function            PT Goals (current goals can now be found in the care plan section) Acute Rehab PT Goals Patient Stated Goal: Be able to return home - is open to SNF rehab PT Goal Formulation: With patient Time For Goal Achievement: 06/14/19 Potential to Achieve Goals: Good Progress towards PT goals: Progressing toward goals    Frequency    7X/week      PT Plan Current plan remains appropriate    Co-evaluation               AM-PAC PT "6 Clicks" Mobility   Outcome Measure  Help needed turning from your back to your side while in a flat bed without using bedrails?: A Lot Help needed moving from lying on your back to sitting on the side of a flat bed without using bedrails?: A Lot Help needed moving to and from a bed to a chair (including a wheelchair)?: A Lot Help needed standing up from a chair using your arms (e.g., wheelchair or bedside chair)?: A Lot Help needed to walk in hospital room?: A Little Help needed climbing 3-5 steps with a railing? : Total 6 Click Score: 12    End of Session Equipment Utilized During Treatment: Gait belt Activity Tolerance: Patient tolerated treatment well Patient left: in chair;with call bell/phone within reach Nurse Communication: Mobility status PT Visit Diagnosis: Unsteadiness on feet (R26.81);Pain Pain - Right/Left: Left Pain - part of body: Hip     Time: 5701-7793 PT Time Calculation (min) (ACUTE ONLY): 34 min  Charges:      06/10/19 1200  PT Time Calculation  PT Start Time (ACUTE ONLY) 0858  PT Stop Time (ACUTE ONLY) 0932  PT Time Calculation (min) (ACUTE ONLY) 34 min  PT General Charges  $$ ACUTE PT VISIT 1 Visit  PT Treatments  $Gait Training 8-22 mins  $Therapeutic Activity 8-22 mins                       Roney Marion, PT  Acute Rehabilitation Services Pager [phone] Office [place] 06/10/2019, 12:29 PM

## 2019-06-10 NOTE — Plan of Care (Signed)
  Problem: Health Behavior/Discharge Planning: Goal: Ability to manage health-related needs will improve Outcome: Progressing   Problem: Clinical Measurements: Goal: Ability to maintain clinical measurements within normal limits will improve Outcome: Progressing   Problem: Activity: Goal: Risk for activity intolerance will decrease Outcome: Progressing   Problem: Nutrition: Goal: Adequate nutrition will be maintained Outcome: Progressing   Problem: Coping: Goal: Level of anxiety will decrease Outcome: Progressing   Problem: Elimination: Goal: Will not experience complications related to bowel motility Outcome: Progressing   Problem: Pain Managment: Goal: General experience of comfort will improve Outcome: Progressing   Problem: Safety: Goal: Ability to remain free from injury will improve Outcome: Progressing   Problem: Skin Integrity: Goal: Risk for impaired skin integrity will decrease Outcome: Progressing   Problem: Activity: Goal: Ability to avoid complications of mobility impairment will improve Outcome: Progressing   Problem: Clinical Measurements: Goal: Postoperative complications will be avoided or minimized Outcome: Progressing

## 2019-06-10 NOTE — Consult Note (Signed)
   [facility] CM Inpatient Consult   06/10/2019  Travis [name]. [birth year] [id number]  Patient screened for disposition needs in  [place].  Review of patient's medical record reveals patient is status post Left total hip replacement.  Insurance: [employer]  Primary Care Provider is [name], MD   Plan:  Reveals patient is for a skilled nursing facility stay anticipated at [facility]. No further [facility] follow up needs at this time.    For questions contact:   [name], RN BSN [facility] Liaison  [phone] business mobile phone Toll free office [phone]  Fax number: [phone] [email or web address]

## 2019-06-10 NOTE — TOC Progression Note (Addendum)
Transition of Care (TOC) - Progression Note    Patient Details  Name: Travis DYAS Sr. MRN: [id number] Date of Birth: 1950-08-08  Transition of Care [facility]) CM/SW [place], LCSW Phone Number: 06/10/2019, 11:53 AM  Clinical Narrative:      Update 1:42pm Anticipate discharge to [facility] tomorrow pending negative COVID test.  Patient was worked up over the weekend. CSW reached out to facilities that have offered a bed. Waiting on response. Patient will need a COVID test prior to discharge. Message was sent to MD.   Dispo is SNF pending bed offer and negative COVID.  Expected Discharge Plan: Skilled Nursing Facility Barriers to Discharge: SNF Pending bed offer  Expected Discharge Plan and Services Expected Discharge Plan: [place]   Discharge Planning Services: CM Consult Post Acute Care Choice: [place] Living arrangements for the past 2 months: Single Family Home Expected Discharge Date: 06/10/19                         HH Arranged: PT [place]: [facility] [employer] Date [place]: 06/06/19 Time [place]: [place] Representative spoke with at [place]: [place] ([place]) Interventions    Readmission Risk Interventions No flowsheet data found.

## 2019-06-10 NOTE — Discharge Instructions (Signed)
INSTRUCTIONS AFTER JOINT REPLACEMENT   o Remove items at home which could result in a fall. This includes throw rugs or furniture in walking pathways o ICE to the affected joint every three hours while awake for 30 minutes at a time, for at least the first 3-5 days, and then as needed for pain and swelling.  Continue to use ice for pain and swelling. You may notice swelling that will progress down to the foot and ankle.  This is normal after surgery.  Elevate your leg when you are not up walking on it.   o Continue to use the breathing machine you got in the hospital (incentive spirometer) which will help keep your temperature down.  It is common for your temperature to cycle up and down following surgery, especially at night when you are not up moving around and exerting yourself.  The breathing machine keeps your lungs expanded and your temperature down.   DIET:  As you were doing prior to hospitalization, we recommend a well-balanced diet.  DRESSING / WOUND CARE / SHOWERING  You may change your dressing 3-5 days after surgery.  Then change the dressing every day with sterile gauze.  Please use good hand washing techniques before changing the dressing.  Do not use any lotions or creams on the incision until instructed by your surgeon.  ACTIVITY  o Increase activity slowly as tolerated, but follow the weight bearing instructions below.   o No driving for 6 weeks or until further direction given by your physician.  You cannot drive while taking narcotics.  o No lifting or carrying greater than 10 lbs. until further directed by your surgeon. o Avoid periods of inactivity such as sitting longer than an hour when not asleep. This helps prevent blood clots.  o You may return to work once you are authorized by your doctor.     WEIGHT BEARING   Weight bearing as tolerated with assist device (walker, cane, etc) as directed, use it as long as suggested by your surgeon or therapist, typically at  least 4-6 weeks.   EXERCISES  Results after joint replacement surgery are often greatly improved when you follow the exercise, range of motion and muscle strengthening exercises prescribed by your doctor. Safety measures are also important to protect the joint from further injury. Any time any of these exercises cause you to have increased pain or swelling, decrease what you are doing until you are comfortable again and then slowly increase them. If you have problems or questions, call your caregiver or physical therapist for advice.   Rehabilitation is important following a joint replacement. After just a few days of immobilization, the muscles of the leg can become weakened and shrink (atrophy).  These exercises are designed to build up the tone and strength of the thigh and leg muscles and to improve motion. Often times heat used for twenty to thirty minutes before working out will loosen up your tissues and help with improving the range of motion but do not use heat for the first two weeks following surgery (sometimes heat can increase post-operative swelling).   These exercises can be done on a training (exercise) mat, on the floor, on a table or on a bed. Use whatever works the best and is most comfortable for you.    Use music or television while you are exercising so that the exercises are a pleasant break in your day. This will make your life better with the exercises acting as a break  in your routine that you can look forward to.   Perform all exercises about fifteen times, three times per day or as directed.  You should exercise both the operative leg and the other leg as well.  Exercises include:   . Quad Sets - Tighten up the muscle on the front of the thigh (Quad) and hold for 5-10 seconds.   . Straight Leg Raises - With your knee straight (if you were given a brace, keep it on), lift the leg to 60 degrees, hold for 3 seconds, and slowly lower the leg.  Perform this exercise against  resistance later as your leg gets stronger.  . Leg Slides: Lying on your back, slowly slide your foot toward your buttocks, bending your knee up off the floor (only go as far as is comfortable). Then slowly slide your foot back down until your leg is flat on the floor again.  [place] Haring Wings: Lying on your back spread your legs to the side as far apart as you can without causing discomfort.  . Hamstring Strength:  Lying on your back, push your heel against the floor with your leg straight by tightening up the muscles of your buttocks.  Repeat, but this time bend your knee to a comfortable angle, and push your heel against the floor.  You may put a pillow under the heel to make it more comfortable if necessary.   A rehabilitation program following joint replacement surgery can speed recovery and prevent re-injury in the future due to weakened muscles. Contact your doctor or a physical therapist for more information on knee rehabilitation.    CONSTIPATION  Constipation is defined medically as fewer than three stools per week and severe constipation as less than one stool per week.  Even if you have a regular bowel pattern at home, your normal regimen is likely to be disrupted due to multiple reasons following surgery.  Combination of anesthesia, postoperative narcotics, change in appetite and fluid intake all can affect your bowels.   YOU MUST use at least one of the following options; they are listed in order of increasing strength to get the job done.  They are all available over the counter, and you may need to use some, POSSIBLY even all of these options:    Drink plenty of fluids (prune juice may be helpful) and high fiber foods Colace 100 mg by mouth twice a day  Senokot for constipation as directed and as needed Dulcolax (bisacodyl), take with full glass of water  Miralax (polyethylene glycol) once or twice a day as needed.  If you have tried all these things and are unable to have a bowel  movement in the first 3-4 days after surgery call either your surgeon or your primary doctor.    If you experience loose stools or diarrhea, hold the medications until you stool forms back up.  If your symptoms do not get better within 1 week or if they get worse, check with your doctor.  If you experience "the worst abdominal pain ever" or develop nausea or vomiting, please contact the office immediately for further recommendations for treatment.   ITCHING:  If you experience itching with your medications, try taking only a single pain pill, or even half a pain pill at a time.  You can also use Benadryl over the counter for itching or also to help with sleep.   TED HOSE STOCKINGS:  Use stockings on both legs until for at least 2 weeks or as  directed by physician office. They may be removed at night for sleeping.  MEDICATIONS:  See your medication summary on the "After Visit Summary" that nursing will review with you.  You may have some home medications which will be placed on hold until you complete the course of blood thinner medication.  It is important for you to complete the blood thinner medication as prescribed.  PRECAUTIONS:  If you experience chest pain or shortness of breath - call 911 immediately for transfer to the hospital emergency department.   If you develop a fever greater that 101 F, purulent drainage from wound, increased redness or drainage from wound, foul odor from the wound/dressing, or calf pain - CONTACT YOUR SURGEON.                                                   FOLLOW-UP APPOINTMENTS:  If you do not already have a post-op appointment, please call the office for an appointment to be seen by your surgeon.  Guidelines for how soon to be seen are listed in your "After Visit Summary", but are typically between 1-4 weeks after surgery.  OTHER INSTRUCTIONS:   Knee Replacement:  Do not place pillow under knee, focus on keeping the knee straight while resting. CPM  instructions: 0-90 degrees, 2 hours in the morning, 2 hours in the afternoon, and 2 hours in the evening. Place foam block, curve side up under heel at all times except when in CPM or when walking.  DO NOT modify, tear, cut, or change the foam block in any way.  MAKE SURE YOU:  . Understand these instructions.  . Get help right away if you are not doing well or get worse.    Thank you for letting us be a part of your medical care team.  It is a privilege we respect greatly.  We hope these instructions will help you stay on track for a fast and full recovery!

## 2019-06-10 NOTE — Progress Notes (Signed)
Physical Therapy Treatment Patient Details Name: Travis BOOMERSHINE Sr. MRN: [id number] DOB: [birth year] Today's Date: 06/10/2019    History of Present Illness Pt is a 69 y/o male who presents s/p L THA on 06/06/2019. Extensive PMH significant for DMII, prior spinal surgeries, B rotator cuff repairs, MI, HTN, fibromyalgia, chronic pain syndrome, B TKR, R THR, R BKA with prosthesis.    PT Comments    Continuing work on functional mobility and activity tolerance;  Session focused on Therapeutic exercise aimed at mobility and control of L hip joint; still painful, but it appears premedication helped; Noted likely for dc to SNF for post-acute rehab tomorrow; PT in agreement for SNF   Follow Up Recommendations  Follow surgeon's recommendation for DC plan and follow-up therapies     Equipment Recommendations  Other (comment)(TBD next venue (PT recommend SNF))    Recommendations for Other Services       Precautions / Restrictions Precautions Precautions: Fall Precaution Comments: Direct Anterior Approach Required Braces or Orthoses: Other Brace Other Brace: R prosthesis Restrictions LLE Weight Bearing: Weight bearing as tolerated       Balance                                         Cognition Arousal/Alertness: Awake/alert Behavior During Therapy: WFL for tasks assessed/performed Overall Cognitive Status: Within Functional Limits for tasks assessed                                        Exercises Total Joint Exercises Ankle Circles/Pumps: AROM;Both;15 reps;Supine Quad Sets: AROM;Both;10 reps;Supine Gluteal Sets: AROM;Both;10 reps;Supine Towel Squeeze: AROM;Both;10 reps;Supine(Isometric) Short Arc Quad: AROM;Left;10 reps;Supine Heel Slides: Left;10 reps;AAROM Hip ABduction/ADduction: AAROM;Left;10 reps    General Comments General comments (skin integrity, edema, etc.): Very sweaty and itchy today; Assisted pt with changing out of sweaty  clothes      Pertinent Vitals/Pain Pain Assessment: Faces Pain Score: 8  Faces Pain Scale: Hurts even more Pain Location: L hip with movement Pain Descriptors / Indicators: Grimacing Pain Intervention(s): Premedicated before session    Home Living                      Prior Function            PT Goals (current goals can now be found in the care plan section) Acute Rehab PT Goals Patient Stated Goal: Be able to return home - is open to SNF rehab PT Goal Formulation: With patient Time For Goal Achievement: 06/14/19 Potential to Achieve Goals: Good Progress towards PT goals: Progressing toward goals    Frequency    7X/week      PT Plan Current plan remains appropriate    Co-evaluation              AM-PAC PT "6 Clicks" Mobility   Outcome Measure  Help needed turning from your back to your side while in a flat bed without using bedrails?: A Lot Help needed moving from lying on your back to sitting on the side of a flat bed without using bedrails?: A Lot Help needed moving to and from a bed to a chair (including a wheelchair)?: A Lot Help needed standing up from a chair using your arms (e.g., wheelchair or bedside chair)?: A Lot Help needed  to walk in hospital room?: A Little Help needed climbing 3-5 steps with a railing? : Total 6 Click Score: 12    End of Session Equipment Utilized During Treatment: Gait belt Activity Tolerance: Patient tolerated treatment well Patient left: in bed;with call bell/phone within reach Nurse Communication: Mobility status PT Visit Diagnosis: Unsteadiness on feet (R26.81);Pain Pain - Right/Left: Left Pain - part of body: Hip     Time: 7867-6720 PT Time Calculation (min) (ACUTE ONLY): 18 min  Charges:  $Gait Training: 8-22 mins $Therapeutic Exercise: 8-22 mins $Therapeutic Activity: 8-22 mins                     Roney Marion, PT  Acute Rehabilitation Services Pager [phone] Office [place] 06/10/2019, 2:25 PM

## 2019-06-10 NOTE — Discharge Summary (Signed)
Patient ID: Travis Novak Sr. MRN: [id number] DOB/AGE: [birth year] 67 y.o.  Admit date: 06/06/2019 Discharge date: 06/10/2019  Admission Diagnoses:  Principal Problem:   Unilateral primary osteoarthritis, left hip Active Problems:   Status post total replacement of left hip   Discharge Diagnoses:  Same  Past Medical History:  Diagnosis Date  . ALLERGIC RHINITIS 06/24/2009  . Allergy   . Anxiety 11/12/2011   Adequate for discharge   . Arthritis    "all my joints" (09/30/2013)  . Arthritis of foot, right, degenerative 04/15/2014  . Balance disorder 03/12/2013  . Benign neoplasm of cecum   . Benign neoplasm of descending colon   . CAD (coronary artery disease) 06/24/2009   5 stents placed in 2007    . Chronic anticoagulation   . Chronic pain syndrome 10/27/2009   of ankle, shoulders, low back.  sciatica.   . Closed fracture of right foot 10/17/2014  . CORONARY ARTERY DISEASE 06/24/2009   a. s/p multiple PCIs - In 2008 he had a Taxus DES to the mild LAD, Endeavor DES to mid LCX and distal LCX. In January 2009 he had DES to distal LCX, mid LCX and proximal LCX. In November 2009 had BMS x 2 to the mid RCA. Cath 10/2011 with patent stents, noncardiac CP. LHC 01/2013: patent stents (noncardiac CP).  . DEGENERATIVE JOINT DISEASE 06/24/2009   Qualifier: Diagnosis of  By: [name], [name]   . Depression   . Depression with anxiety    Prior suicide attempt  . [place] DISEASE, LUMBAR 04/19/2010  . ERECTILE DYSFUNCTION, ORGANIC 05/30/2010  . Essential hypertension 06/24/2009   Qualifier: Diagnosis of  By: [name], [name]   . Fibromyalgia   . Fracture dislocation of ankle joint 09/02/2015  . Gait disorder 03/12/2013  . General weakness 07/14/2014  . GERD (gastroesophageal reflux disease) 09/08/2015  . Hand joint pain 06/10/2013  . Heart murmur   . Hepatitis C    treated pt. unknown with what he was a teenager  . History of kidney stones   . Hyperlipidemia 07/15/2009   Qualifier: Diagnosis of  By:  Aundra Dubin, MD, [place]    . HYPERLIPIDEMIA-MIXED 07/15/2009  . HYPERTENSION 06/24/2009  . Insomnia 10/04/2011  . Irregular heart beat   . Left hip pain 03/12/2013   Injected under ultrasound guidance on June 24, 2013   . Major depression 09/13/2015  . Myocardial infarction ([place]) 2008  . Non-cardiac chest pain 10/2011, 01/2013  . Obesity   . Occult blood positive stool 10/17/2014  . Open ankle fracture 09/02/2015  . OSA (obstructive sleep apnea)    not using CPAP (09/30/2013)  . Pain of right thumb 04/03/2013  . Pneumonia   . PPD positive 04/08/2015  . Pre-ulcerative corn or callous 02/06/2013  . Rotator cuff tear arthropathy of both shoulders 06/10/2013   History of bilateral shoulder cuff surgery for rotator cuff tears. Reports increase in pain 09/11/2015 during physical therapy of the left shoulder.   [name] SCIATICA, LEFT 04/19/2010   Qualifier: Diagnosis of  By: [name], [name]   . Sleep apnea    wears cpap   . Spinal stenosis in cervical region 09/26/2013  . Spinal stenosis, lumbar region, with neurogenic claudication 09/26/2013  . Type II diabetes mellitus ([place]) 2012   no meds in 09/2014.   [name] Uncontrolled type 2 DM with peripheral circulatory disorder ([place]) 10/04/2013  . URETHRAL STRICTURE 06/24/2009   self catheterizes.     Surgeries: Procedure(s): LEFT TOTAL  HIP ARTHROPLASTY ANTERIOR APPROACH on 06/06/2019   Consultants:   Discharged Condition: Improved  Hospital Course: Travis [name]. is an 69 y.o. male who was admitted 06/06/2019 for operative treatment ofUnilateral primary osteoarthritis, left hip. Patient has severe unremitting pain that affects sleep, daily activities, and work/hobbies. After pre-op clearance the patient was taken to the operating room on 06/06/2019 and underwent  Procedure(s): LEFT TOTAL HIP ARTHROPLASTY ANTERIOR APPROACH.    Patient was given perioperative antibiotics:  Anti-infectives (From admission, onward)   Start     Dose/Rate Route Frequency Ordered  Stop   06/06/19 1800  ceFAZolin (ANCEF) IVPB 1 g/50 mL premix     1 g 100 mL/hr over 30 Minutes Intravenous Every 6 hours 06/06/19 1534 06/06/19 2320   06/06/19 1130  ceFAZolin (ANCEF) 3 g in dextrose 5 % 50 mL IVPB     3 g 100 mL/hr over 30 Minutes Intravenous To Surgery 06/05/19 1351 06/06/19 1243       Patient was given sequential compression devices, early ambulation, and chemoprophylaxis to prevent DVT.  Patient benefited maximally from hospital stay and there were no complications.  Discharge prolonged due to pain and slow progress with PT.   Recent vital signs:  Patient Vitals for the past 24 hrs:  BP Temp Temp src Pulse Resp SpO2  06/10/19 1023 136/61 99.5 F (37.5 C) Oral 76 18 98 %  06/10/19 0323 114/60 99.5 F (37.5 C) Oral 78 18 96 %  06/09/19 1944 124/61 99.2 F (37.3 C) Oral 75 18 100 %  06/09/19 1602 -- 99.2 F (37.3 C) Oral -- -- --  06/09/19 1422 132/60 (!) 100.4 F (38 C) Oral 85 18 100 %     Recent laboratory studies:  Recent Labs    06/08/19 0730 06/09/19 0520  WBC 7.6 6.3  HGB 9.7* 9.3*  HCT 29.3* 27.5*  PLT 227 252     Discharge Medications:   Allergies as of 06/10/2019   No Known Allergies     Medication List    STOP taking these medications   aspirin EC 81 MG tablet Replaced by: aspirin 81 MG chewable tablet   HYDROcodone-acetaminophen 10-325 MG tablet Commonly known as: NORCO     TAKE these medications   ARIPiprazole 5 MG tablet Commonly known as: ABILIFY Take 1 tablet (5 mg total) by mouth at bedtime.   aspirin 81 MG chewable tablet Chew 1 tablet (81 mg total) by mouth 2 (two) times daily. Replaces: aspirin EC 81 MG tablet   atorvastatin 20 MG tablet Commonly known as: LIPITOR Take 20 mg by mouth daily.   BARIATRIC MULTIVITAMINS/IRON PO Take 1 tablet by mouth daily.   benztropine 0.5 MG tablet Commonly known as: COGENTIN Take 1 tablet (0.5 mg total) by mouth at bedtime.   calcium carbonate 500 MG chewable  tablet Commonly known as: TUMS - dosed in mg elemental calcium Chew 1,000 mg by mouth at bedtime.   celecoxib 200 MG capsule Commonly known as: CELEBREX Take 200 mg by mouth 2 (two) times daily.   clonazePAM 0.5 MG tablet Commonly known as: KLONOPIN Take 1 tablet (0.5 mg total) by mouth at bedtime.   cyclobenzaprine 5 MG tablet Commonly known as: FLEXERIL Take 5 mg by mouth at bedtime.   diclofenac sodium 1 % Gel Commonly known as: VOLTAREN Apply 4 g topically 3 (three) times daily as needed (pain).   DULoxetine 30 MG capsule Commonly known as: CYMBALTA Take 1 capsule (30 mg total) by mouth  2 (two) times daily.   FIBER-CAPS PO Take 3 capsules by mouth at bedtime.   Fish Oil 1000 MG Caps Take 1,000 mg by mouth at bedtime.   gabapentin 800 MG tablet Commonly known as: NEURONTIN Take 800 mg by mouth 3 (three) times daily.   hydrochlorothiazide 25 MG tablet Commonly known as: HYDRODIURIL Take 25 mg by mouth at bedtime.   Melatonin 5 MG Tabs Take 20 mg by mouth at bedtime.   methocarbamol 500 MG tablet Commonly known as: ROBAXIN Take 1 tablet (500 mg total) by mouth every 6 (six) hours as needed for muscle spasms. What changed: when to take this   Myrbetriq 50 MG Tb24 tablet Generic drug: mirabegron ER Take 50 mg by mouth at bedtime.   oxyCODONE 5 MG immediate release tablet Commonly known as: Oxy IR/ROXICODONE Take 1-2 tablets (5-10 mg total) by mouth every 4 (four) hours as needed for moderate pain (pain score 4-6).   oxymetazoline 0.05 % nasal spray Commonly known as: AFRIN Place 1 spray into both nostrils 2 (two) times daily as needed for congestion.   pantoprazole 40 MG tablet Commonly known as: PROTONIX TAKE ONE TABLET BY MOUTH EVERY DAY   prasugrel 5 MG Tabs tablet Commonly known as: EFFIENT Take 5 mg by mouth at bedtime.   sildenafil 100 MG tablet Commonly known as: VIAGRA Take 100 mg by mouth daily as needed for erectile dysfunction.    traZODone 150 MG tablet Commonly known as: DESYREL Take 1 tablet (150 mg total) by mouth at bedtime.   Zenpep 32355-73220 units Cpep Generic drug: Pancrelipase (Lip-Prot-Amyl) Take 2 capsules with meals and 1 capsule with snacks            Durable Medical Equipment  (From admission, onward)         Start     Ordered   06/06/19 1535  DME 3 n 1  Once     06/06/19 1534   06/06/19 1535  DME Walker rolling  Once    Question Answer Comment  Walker: With 5 Inch Wheels   Patient needs a walker to treat with the following condition Status post total replacement of left hip      06/06/19 1534          Diagnostic Studies: DG Pelvis Portable  Result Date: 06/06/2019 CLINICAL DATA:  Status post total left hip replacement. EXAM: PORTABLE PELVIS 1-2 VIEWS COMPARISON:  Prior left hip series same day. FINDINGS: Left hip replacement. Hardware intact. Anatomic alignment. No acute bony abnormality. Surgical staples noted over the left hip. Prior right hip replacement. IMPRESSION: Left hip replacement. Hardware intact. Anatomic alignment. Hardware intact. Electronically Signed   By: Marcello Moores  Register   On: 06/06/2019 15:20   DG C-Arm 1-60 Min  Result Date: 06/06/2019 CLINICAL DATA:  Status post left hip replacement EXAM: OPERATIVE LEFT HIP WITH PELVIS; DG C-ARM 1-60 MIN COMPARISON:  None. FLUOROSCOPY TIME:  Radiation Exposure Index (as provided by the fluoroscopic device): Not available If the device does not provide the exposure index: Fluoroscopy Time:  26 seconds Number of Acquired Images:  4 FINDINGS: Left hip replacement is now visualized in satisfactory position. No acute bony or soft tissue abnormality is noted. IMPRESSION: Status post left hip replacement Electronically Signed   By: Inez Catalina M.D.   On: 06/06/2019 19:00   DG HIP OPERATIVE UNILAT WITH PELVIS LEFT  Result Date: 06/06/2019 CLINICAL DATA:  Status post left hip replacement EXAM: OPERATIVE LEFT HIP WITH PELVIS; DG C-ARM  1-60 MIN COMPARISON:  None. FLUOROSCOPY TIME:  Radiation Exposure Index (as provided by the fluoroscopic device): Not available If the device does not provide the exposure index: Fluoroscopy Time:  26 seconds Number of Acquired Images:  4 FINDINGS: Left hip replacement is now visualized in satisfactory position. No acute bony or soft tissue abnormality is noted. IMPRESSION: Status post left hip replacement Electronically Signed   By: Inez Catalina M.D.   On: 06/06/2019 19:00    Disposition:  Skilled nursing facility.    Follow-up Information    [facility], [facility] Follow up.   Specialty: Home Health Services Why: Physical therapist will contact you for home visits.  Contact information: [address] [place] [address] [phone]        Mcarthur Rossetti, MD. Schedule an appointment as soon as possible for a visit in 2 week(s).   Specialty: Orthopedic Surgery Contact information: [address] [place] [address] [phone]            Signed: Erskine Emery 06/10/2019, 10:58 AM

## 2019-06-10 NOTE — Progress Notes (Signed)
Subjective: 4 Days Post-Op Procedure(s) (LRB): LEFT TOTAL HIP ARTHROPLASTY ANTERIOR APPROACH (Left) Patient reports pain as moderate to severe.   Slow progress with PT. No complaints except for left knee pain.  Objective: Vital signs in last 24 hours: Temp:  [99.2 F (37.3 C)-100.4 F (38 C)] 99.5 F (37.5 C) (01/19 1023) Pulse Rate:  [75-85] 76 (01/19 1023) Resp:  [18] 18 (01/19 1023) BP: (114-136)/(60-61) 136/61 (01/19 1023) SpO2:  [96 %-100 %] 98 % (01/19 1023)  Intake/Output from previous day: 01/18 0701 - 01/19 0700 In: 480 [P.O.:480] Out: 2050 [Urine:2050] Intake/Output this shift: No intake/output data recorded.  Recent Labs    06/08/19 0730 06/09/19 0520  HGB 9.7* 9.3*   Recent Labs    06/08/19 0730 06/09/19 0520  WBC 7.6 6.3  RBC 3.27* 3.08*  HCT 29.3* 27.5*  PLT 227 252   No results for input(s): NA, K, CL, CO2, BUN, CREATININE, GLUCOSE, CALCIUM in the last 72 hours. No results for input(s): LABPT, INR in the last 72 hours.  Left lower extremity: Intact pulses distally Dorsiflexion/Plantar flexion intact Incision: scant drainage Compartment soft   Assessment/Plan: 4 Days Post-Op Procedure(s) (LRB): LEFT TOTAL HIP ARTHROPLASTY ANTERIOR APPROACH (Left) Up with therapy  Awaiting SNF bed placement.       Travis [name] 06/10/2019, 10:50 AM

## 2019-06-11 DIAGNOSIS — M545 Low back pain: Secondary | ICD-10-CM | POA: Diagnosis not present

## 2019-06-11 DIAGNOSIS — I2510 Atherosclerotic heart disease of native coronary artery without angina pectoris: Secondary | ICD-10-CM | POA: Diagnosis not present

## 2019-06-11 DIAGNOSIS — R1013 Epigastric pain: Secondary | ICD-10-CM | POA: Diagnosis not present

## 2019-06-11 DIAGNOSIS — F339 Major depressive disorder, recurrent, unspecified: Secondary | ICD-10-CM | POA: Diagnosis not present

## 2019-06-11 DIAGNOSIS — I110 Hypertensive heart disease with heart failure: Secondary | ICD-10-CM | POA: Diagnosis not present

## 2019-06-11 DIAGNOSIS — M2550 Pain in unspecified joint: Secondary | ICD-10-CM | POA: Diagnosis not present

## 2019-06-11 DIAGNOSIS — L97119 Non-pressure chronic ulcer of right thigh with unspecified severity: Secondary | ICD-10-CM | POA: Diagnosis not present

## 2019-06-11 DIAGNOSIS — K219 Gastro-esophageal reflux disease without esophagitis: Secondary | ICD-10-CM | POA: Diagnosis not present

## 2019-06-11 DIAGNOSIS — L97129 Non-pressure chronic ulcer of left thigh with unspecified severity: Secondary | ICD-10-CM | POA: Diagnosis not present

## 2019-06-11 DIAGNOSIS — G4733 Obstructive sleep apnea (adult) (pediatric): Secondary | ICD-10-CM | POA: Diagnosis not present

## 2019-06-11 DIAGNOSIS — G546 Phantom limb syndrome with pain: Secondary | ICD-10-CM | POA: Diagnosis not present

## 2019-06-11 DIAGNOSIS — Z96642 Presence of left artificial hip joint: Secondary | ICD-10-CM | POA: Diagnosis not present

## 2019-06-11 DIAGNOSIS — Z7401 Bed confinement status: Secondary | ICD-10-CM | POA: Diagnosis not present

## 2019-06-11 DIAGNOSIS — M961 Postlaminectomy syndrome, not elsewhere classified: Secondary | ICD-10-CM | POA: Diagnosis not present

## 2019-06-11 DIAGNOSIS — Z89511 Acquired absence of right leg below knee: Secondary | ICD-10-CM | POA: Diagnosis not present

## 2019-06-11 DIAGNOSIS — G8929 Other chronic pain: Secondary | ICD-10-CM | POA: Diagnosis not present

## 2019-06-11 DIAGNOSIS — G894 Chronic pain syndrome: Secondary | ICD-10-CM | POA: Diagnosis not present

## 2019-06-11 DIAGNOSIS — Z471 Aftercare following joint replacement surgery: Secondary | ICD-10-CM | POA: Diagnosis not present

## 2019-06-11 DIAGNOSIS — Z6841 Body Mass Index (BMI) 40.0 and over, adult: Secondary | ICD-10-CM | POA: Diagnosis not present

## 2019-06-11 DIAGNOSIS — F411 Generalized anxiety disorder: Secondary | ICD-10-CM | POA: Diagnosis not present

## 2019-06-11 DIAGNOSIS — E785 Hyperlipidemia, unspecified: Secondary | ICD-10-CM | POA: Diagnosis not present

## 2019-06-11 DIAGNOSIS — E119 Type 2 diabetes mellitus without complications: Secondary | ICD-10-CM | POA: Diagnosis not present

## 2019-06-11 DIAGNOSIS — Z20828 Contact with and (suspected) exposure to other viral communicable diseases: Secondary | ICD-10-CM | POA: Diagnosis not present

## 2019-06-11 DIAGNOSIS — M6281 Muscle weakness (generalized): Secondary | ICD-10-CM | POA: Diagnosis not present

## 2019-06-11 DIAGNOSIS — M1612 Unilateral primary osteoarthritis, left hip: Secondary | ICD-10-CM | POA: Diagnosis not present

## 2019-06-11 DIAGNOSIS — F331 Major depressive disorder, recurrent, moderate: Secondary | ICD-10-CM | POA: Diagnosis not present

## 2019-06-11 DIAGNOSIS — M47816 Spondylosis without myelopathy or radiculopathy, lumbar region: Secondary | ICD-10-CM | POA: Diagnosis not present

## 2019-06-11 NOTE — Progress Notes (Signed)
Physical Therapy Treatment Patient Details Name: Travis FERG Sr. MRN: [id number] DOB: 1950-08-02 Today's Date: 06/11/2019    History of Present Illness Pt is a 69 y/o male who presents s/p L THA on 06/06/2019. Extensive PMH significant for DMII, prior spinal surgeries, B rotator cuff repairs, MI, HTN, fibromyalgia, chronic pain syndrome, B TKR, R THR, R BKA with prosthesis.    PT Comments    Pt admitted for above. He was able to improve his bed mobility today by requiring heavy mod assist +1. He was noticeably tired after reaching EOB. He required min assist +1 for transfers and min guard for gait. However, pt with very flexed trunk today and needed repeated cues for breathing, posture, safety, and walker management. He noted that his prosthesis felt out of place during walk and needed chair brought to him. Pt had become diaphoretic and prosthetic was sweaty and had moved because of this. Removed prosthetic sleeve and cleaned it per pt instruction. Educated pt on asking for pain meds prior to later session today. See pt is likely being discharged to SNF today, and feel this is still the best plan. He would benefit from continued skilled acute PT intervention to address deficits. Will follow-up this afternoon if time permits and pt is still here.   Follow Up Recommendations  Follow surgeon's recommendation for DC plan and follow-up therapies     Equipment Recommendations  Other (comment)(TBD )    Recommendations for Other Services       Precautions / Restrictions Precautions Precautions: Fall Restrictions Weight Bearing Restrictions: Yes LLE Weight Bearing: Weight bearing as tolerated    Mobility  Bed Mobility Overal bed mobility: Needs Assistance Bed Mobility: Supine to Sit     Supine to sit: Mod assist;HOB elevated     General bed mobility comments: Heavy mod +1 today; unable to reach for bed rails due to previous R rotator cuff injury; moves very slowly and needed the  most help with trunk righting  Transfers Overall transfer level: Needs assistance Equipment used: Rolling walker (2 wheeled) Transfers: Sit to/from Stand Sit to Stand: Min assist;From elevated surface         General transfer comment: Bed height raised significantly to get prosthesis on and locked into place. Min assist to power-up into full standing, and steady during transition of hands from bed to RW  Ambulation/Gait Ambulation/Gait assistance: Min guard;+2 safety/equipment Gait Distance (Feet): 35 Feet Assistive device: Rolling walker (2 wheeled) Gait Pattern/deviations: Step-to pattern;Decreased stride length;Trunk flexed Gait velocity: very decreased   General Gait Details: pt with excessively flexed trunk today; seems to be having more difficulty with gait; cues for posture, hand placement; pt's prosthesis started to move, so needed to get chair and sit in hallway   Stairs             Wheelchair Mobility    Modified Rankin (Stroke Patients Only)       Balance Overall balance assessment: Needs assistance Sitting-balance support: No upper extremity supported;Feet supported Sitting balance-Leahy Scale: Fair                                      Cognition Arousal/Alertness: Awake/alert Behavior During Therapy: WFL for tasks assessed/performed Overall Cognitive Status: Within Functional Limits for tasks assessed  General Comments: pt anxious to mobilize due to pain      Exercises Total Joint Exercises Ankle Circles/Pumps: AROM;Left;15 reps;Supine Quad Sets: AROM;Both;10 reps;Supine Heel Slides: Left;10 reps;AAROM    General Comments General comments (skin integrity, edema, etc.): pt very sweaty and itchy throughout session; prosthesis moved due to sweat; odorous ; needed to clean it as it appeared as though it had not been cleaned in a while      Pertinent Vitals/Pain Pain Assessment:  Faces Faces Pain Scale: Hurts whole lot Pain Location: L hip with movement Pain Descriptors / Indicators: Grimacing Pain Intervention(s): Limited activity within patient's tolerance;Monitored during session;Repositioned    Home Living                      Prior Function            PT Goals (current goals can now be found in the care plan section) Acute Rehab PT Goals Patient Stated Goal: go to SNF PT Goal Formulation: With patient Time For Goal Achievement: 06/14/19 Potential to Achieve Goals: Good Progress towards PT goals: Progressing toward goals    Frequency    7X/week      PT Plan Current plan remains appropriate    Co-evaluation              AM-PAC PT "6 Clicks" Mobility   Outcome Measure  Help needed turning from your back to your side while in a flat bed without using bedrails?: A Lot Help needed moving from lying on your back to sitting on the side of a flat bed without using bedrails?: A Lot Help needed moving to and from a bed to a chair (including a wheelchair)?: A Lot Help needed standing up from a chair using your arms (e.g., wheelchair or bedside chair)?: A Little Help needed to walk in hospital room?: A Little Help needed climbing 3-5 steps with a railing? : A Lot 6 Click Score: 14    End of Session Equipment Utilized During Treatment: Gait belt Activity Tolerance: Patient tolerated treatment well Patient left: in bed;with call bell/phone within reach Nurse Communication: Mobility status PT Visit Diagnosis: Unsteadiness on feet (R26.81);Pain Pain - Right/Left: Left Pain - part of body: Hip     Time: 9794-8016 PT Time Calculation (min) (ACUTE ONLY): 29 min  Charges:  $Gait Training: 8-22 mins $Therapeutic Exercise: 8-22 mins                     Christel Mormon, SPT    [place] [facility] Soter 06/11/2019, 10:06 AM

## 2019-06-11 NOTE — Progress Notes (Signed)
Patient ID: Travis Dyer., male   DOB: April 28, 1951, 69 y.o.   MRN: [id number]  Patient's COVID test was negative and he remains appropriate for discharge to SNF.

## 2019-06-11 NOTE — Progress Notes (Signed)
Report called to jasmin at  [facility] pt going to room 124, awaiting PTAR for transport.

## 2019-06-11 NOTE — Progress Notes (Signed)
Physical Therapy Progress Note  Clinical Impression: Pt admitted for below. He requested to get back to bed, and required use of Stedy as described below. He has difficulty pulling up on handlebars due to previous R shoulder injury, and required mod assist +2 to power up to stand. He required mod assist to return to bed. He continues to have severe pain with all L LE movement, limiting treatment today. Noted that his prosthesis was not clicking properly, so applied ice since pt feels his residual limb has swollen since admission. Pt is scheduled to go to SNF today, but will continue to follow acutely if he ends up remaining in the hospital to address deficits.     06/11/19 1147  PT Visit Information  Last PT Received On 06/11/19  Assistance Needed +2  History of Present Illness Pt is a 69 y/o male who presents s/p L THA on 06/06/2019. Extensive PMH significant for DMII, prior spinal surgeries, B rotator cuff repairs, MI, HTN, fibromyalgia, chronic pain syndrome, B TKR, R THR, R BKA with prosthesis.  Subjective Data  Patient Stated Goal go to SNF  Precautions  Precautions Fall  Precaution Comments Direct Anterior Approach  Required Braces or Orthoses Other Brace  Other Brace R prosthesis  Restrictions  Weight Bearing Restrictions Yes  LLE Weight Bearing WBAT  Pain Assessment  Pain Assessment Faces  Faces Pain Scale 8  Pain Location L hip with movement  Pain Descriptors / Indicators Grimacing  Pain Intervention(s) Limited activity within patient's tolerance;Monitored during session;Repositioned  Cognition  Arousal/Alertness Awake/alert  Behavior During Therapy WFL for tasks assessed/performed  Overall Cognitive Status Within Functional Limits for tasks assessed  General Comments pt anxious to mobilize due to pain  Bed Mobility  Overal bed mobility Needs Assistance  Bed Mobility Sit to Supine  Sit to supine Mod assist;HOB elevated  General bed mobility comments heavy mod +1 for trunk  lowering and L LE management back to bed  Transfers  Overall transfer level Needs assistance  Transfer via Lift Equipment Stedy  Transfers Sit to/from Stand  Sit to Stand Mod assist +2 for physical assistance;From elevated surface  General transfer comment Mod assist +2 with use of Stedy, gait belt, and bed pads, but very slow and increased effort; unable to get prosthetic into place well  Balance  Overall balance assessment Needs assistance  Sitting-balance support No upper extremity supported;Feet supported  Sitting balance-Leahy Scale Fair  Sitting balance - Comments EOB for donning of prosthesis, gait belt; min guard  Standing balance support Bilateral upper extremity supported;During functional activity  Standing balance-Leahy Scale Poor  Standing balance comment Reliant on UE support  Total Joint Exercises  Hip ABduction/ADduction AROM;AAROM;Both;10 reps;Supine  Straight Leg Raises AROM;Right;10 reps;Supine  PT - End of Session  Equipment Utilized During Treatment Gait belt  Activity Tolerance Patient tolerated treatment well;Patient limited by pain  Patient left in bed;with call bell/phone within reach  Nurse Communication Mobility status   PT - Assessment/Plan  PT Plan Current plan remains appropriate  PT Visit Diagnosis Unsteadiness on feet (R26.81);Pain  Pain - Right/Left Left  Pain - part of body Hip  PT Frequency (ACUTE ONLY) 7X/week  Follow Up Recommendations Follow surgeon's recommendation for DC plan and follow-up therapies  PT equipment Other (comment) (TBD)  AM-PAC PT "6 Clicks" Mobility Outcome Measure (Version 2)  Help needed turning from your back to your side while in a flat bed without using bedrails? 2  Help needed moving from lying on your back  to sitting on the side of a flat bed without using bedrails? 2  Help needed moving to and from a bed to a chair (including a wheelchair)? 2  Help needed standing up from a chair using your arms (e.g., wheelchair or  bedside chair)? 3  Help needed to walk in hospital room? 3  Help needed climbing 3-5 steps with a railing?  2  6 Click Score 14  Consider Recommendation of Discharge To: CIR/SNF/LTACH  PT Goal Progression  Progress towards PT goals Progressing toward goals  Acute Rehab PT Goals  PT Goal Formulation With patient  Time For Goal Achievement 06/14/19  Potential to Achieve Goals Good  PT Time Calculation  PT Start Time (ACUTE ONLY) 1100  PT Stop Time (ACUTE ONLY) 1119  PT Time Calculation (min) (ACUTE ONLY) 19 min   PT Charges: $Therapeutic Activity 8-22 mins  Christel Mormon, SPT

## 2019-06-11 NOTE — Discharge Summary (Signed)
Patient ID: Travis Novak Sr. MRN: [id number] DOB/AGE: [birth year] 69 y.o.  Admit date: 06/06/2019 Discharge date: 06/11/2019  Admission Diagnoses:  Principal Problem:   Unilateral primary osteoarthritis, left hip Active Problems:   Status post total replacement of left hip   Discharge Diagnoses:  [place] post left total hip arthroplasty COVID Negative test  Past Medical History:  Diagnosis Date  . ALLERGIC RHINITIS 06/24/2009  . Allergy   . Anxiety 11/12/2011   Adequate for discharge   . Arthritis    "all my joints" (09/30/2013)  . Arthritis of foot, right, degenerative 04/15/2014  . Balance disorder 03/12/2013  . Benign neoplasm of cecum   . Benign neoplasm of descending colon   . CAD (coronary artery disease) 06/24/2009   5 stents placed in 2007    . Chronic anticoagulation   . Chronic pain syndrome 10/27/2009   of ankle, shoulders, low back.  sciatica.   . Closed fracture of right foot 10/17/2014  . CORONARY ARTERY DISEASE 06/24/2009   a. s/p multiple PCIs - In 2008 he had a Taxus DES to the mild LAD, Endeavor DES to mid LCX and distal LCX. In January 2009 he had DES to distal LCX, mid LCX and proximal LCX. In November 2009 had BMS x 2 to the mid RCA. Cath 10/2011 with patent stents, noncardiac CP. LHC 01/2013: patent stents (noncardiac CP).  . DEGENERATIVE JOINT DISEASE 06/24/2009   Qualifier: Diagnosis of  By: [name], [name]   . Depression   . Depression with anxiety    Prior suicide attempt  . [place] DISEASE, LUMBAR 04/19/2010  . ERECTILE DYSFUNCTION, ORGANIC 05/30/2010  . Essential hypertension 06/24/2009   Qualifier: Diagnosis of  By: [name], [name]   . Fibromyalgia   . Fracture dislocation of ankle joint 09/02/2015  . Gait disorder 03/12/2013  . General weakness 07/14/2014  . GERD (gastroesophageal reflux disease) 09/08/2015  . Hand joint pain 06/10/2013  . Heart murmur   . Hepatitis C    treated pt. unknown with what he was a teenager  . History of kidney stones   .  Hyperlipidemia 07/15/2009   Qualifier: Diagnosis of  By: Aundra Dubin, MD, [place]    . HYPERLIPIDEMIA-MIXED 07/15/2009  . HYPERTENSION 06/24/2009  . Insomnia 10/04/2011  . Irregular heart beat   . Left hip pain 03/12/2013   Injected under ultrasound guidance on June 24, 2013   . Major depression 09/13/2015  . Myocardial infarction ([place]) 2008  . Non-cardiac chest pain 10/2011, 01/2013  . Obesity   . Occult blood positive stool 10/17/2014  . Open ankle fracture 09/02/2015  . OSA (obstructive sleep apnea)    not using CPAP (09/30/2013)  . Pain of right thumb 04/03/2013  . Pneumonia   . PPD positive 04/08/2015  . Pre-ulcerative corn or callous 02/06/2013  . Rotator cuff tear arthropathy of both shoulders 06/10/2013   History of bilateral shoulder cuff surgery for rotator cuff tears. Reports increase in pain 09/11/2015 during physical therapy of the left shoulder.   [name] SCIATICA, LEFT 04/19/2010   Qualifier: Diagnosis of  By: [name], [name]   . Sleep apnea    wears cpap   . Spinal stenosis in cervical region 09/26/2013  . Spinal stenosis, lumbar region, with neurogenic claudication 09/26/2013  . Type II diabetes mellitus ([place]) 2012   no meds in 09/2014.   [name] Uncontrolled type 2 DM with peripheral circulatory disorder ([place]) 10/04/2013  . URETHRAL STRICTURE 06/24/2009   self catheterizes.  Surgeries: Procedure(s): LEFT TOTAL HIP ARTHROPLASTY ANTERIOR APPROACH on 06/06/2019   Consultants:   Discharged Condition: Improved  Hospital Course: Travis [name]. is an 69 y.o. male who was admitted 06/06/2019 for operative treatment ofUnilateral primary osteoarthritis, left hip. Patient has severe unremitting pain that affects sleep, daily activities, and work/hobbies. After pre-op clearance the patient was taken to the operating room on 06/06/2019 and underwent  Procedure(s): LEFT TOTAL HIP ARTHROPLASTY ANTERIOR APPROACH.   COVID testing negative  Patient was given perioperative antibiotics:   Anti-infectives (From admission, onward)   Start     Dose/Rate Route Frequency Ordered Stop   06/06/19 1800  ceFAZolin (ANCEF) IVPB 1 g/50 mL premix     1 g 100 mL/hr over 30 Minutes Intravenous Every 6 hours 06/06/19 1534 06/06/19 2320   06/06/19 1130  ceFAZolin (ANCEF) 3 g in dextrose 5 % 50 mL IVPB     3 g 100 mL/hr over 30 Minutes Intravenous To Surgery 06/05/19 1351 06/06/19 1243       Patient was given sequential compression devices, early ambulation, and chemoprophylaxis to prevent DVT.  Patient benefited maximally from hospital stay and there were no complications.    Recent vital signs:  Patient Vitals for the past 24 hrs:  BP Temp Temp src Pulse Resp SpO2  06/11/19 0753 (!) 118/45 98.8 F (37.1 C) Oral 81 19 92 %  06/11/19 0356 125/61 98.5 F (36.9 C) Oral 68 18 94 %  06/11/19 0002 -- 99.1 F (37.3 C) Oral -- -- --  06/10/19 2004 124/67 (!) 100.9 F (38.3 C) Oral 73 18 98 %  06/10/19 1626 (!) 133/57 99.6 F (37.6 C) Oral 73 17 94 %  06/10/19 1023 136/61 99.5 F (37.5 C) Oral 76 18 98 %     Recent laboratory studies:  Recent Labs    06/09/19 0520  WBC 6.3  HGB 9.3*  HCT 27.5*  PLT 252     Discharge Medications:   Allergies as of 06/11/2019   No Known Allergies     Medication List    STOP taking these medications   aspirin EC 81 MG tablet Replaced by: aspirin 81 MG chewable tablet   HYDROcodone-acetaminophen 10-325 MG tablet Commonly known as: NORCO     TAKE these medications   ARIPiprazole 5 MG tablet Commonly known as: ABILIFY Take 1 tablet (5 mg total) by mouth at bedtime.   aspirin 81 MG chewable tablet Chew 1 tablet (81 mg total) by mouth 2 (two) times daily. Replaces: aspirin EC 81 MG tablet   atorvastatin 20 MG tablet Commonly known as: LIPITOR Take 20 mg by mouth daily.   BARIATRIC MULTIVITAMINS/IRON PO Take 1 tablet by mouth daily.   benztropine 0.5 MG tablet Commonly known as: COGENTIN Take 1 tablet (0.5 mg total) by mouth  at bedtime.   calcium carbonate 500 MG chewable tablet Commonly known as: TUMS - dosed in mg elemental calcium Chew 1,000 mg by mouth at bedtime.   celecoxib 200 MG capsule Commonly known as: CELEBREX Take 200 mg by mouth 2 (two) times daily.   clonazePAM 0.5 MG tablet Commonly known as: KLONOPIN Take 1 tablet (0.5 mg total) by mouth at bedtime.   cyclobenzaprine 5 MG tablet Commonly known as: FLEXERIL Take 5 mg by mouth at bedtime.   diclofenac sodium 1 % Gel Commonly known as: VOLTAREN Apply 4 g topically 3 (three) times daily as needed (pain).   DULoxetine 30 MG capsule Commonly known as: CYMBALTA Take 1  capsule (30 mg total) by mouth 2 (two) times daily.   FIBER-CAPS PO Take 3 capsules by mouth at bedtime.   Fish Oil 1000 MG Caps Take 1,000 mg by mouth at bedtime.   gabapentin 800 MG tablet Commonly known as: NEURONTIN Take 800 mg by mouth 3 (three) times daily.   hydrochlorothiazide 25 MG tablet Commonly known as: HYDRODIURIL Take 25 mg by mouth at bedtime.   Melatonin 5 MG Tabs Take 20 mg by mouth at bedtime.   methocarbamol 500 MG tablet Commonly known as: ROBAXIN Take 1 tablet (500 mg total) by mouth every 6 (six) hours as needed for muscle spasms. What changed: when to take this   Myrbetriq 50 MG Tb24 tablet Generic drug: mirabegron ER Take 50 mg by mouth at bedtime.   oxyCODONE 5 MG immediate release tablet Commonly known as: Oxy IR/ROXICODONE Take 1-2 tablets (5-10 mg total) by mouth every 4 (four) hours as needed for moderate pain (pain score 4-6).   oxymetazoline 0.05 % nasal spray Commonly known as: AFRIN Place 1 spray into both nostrils 2 (two) times daily as needed for congestion.   pantoprazole 40 MG tablet Commonly known as: PROTONIX TAKE ONE TABLET BY MOUTH EVERY DAY   prasugrel 5 MG Tabs tablet Commonly known as: EFFIENT Take 5 mg by mouth at bedtime.   sildenafil 100 MG tablet Commonly known as: VIAGRA Take 100 mg by mouth  daily as needed for erectile dysfunction.   traZODone 150 MG tablet Commonly known as: DESYREL Take 1 tablet (150 mg total) by mouth at bedtime.   Zenpep 67124-58099 units Cpep Generic drug: Pancrelipase (Lip-Prot-Amyl) Take 2 capsules with meals and 1 capsule with snacks            Durable Medical Equipment  (From admission, onward)         Start     Ordered   06/06/19 1535  DME 3 n 1  Once     06/06/19 1534   06/06/19 1535  DME Walker rolling  Once    Question Answer Comment  Walker: With 5 Inch Wheels   Patient needs a walker to treat with the following condition Status post total replacement of left hip      06/06/19 1534          Diagnostic Studies: DG Pelvis Portable  Result Date: 06/06/2019 CLINICAL DATA:  Status post total left hip replacement. EXAM: PORTABLE PELVIS 1-2 VIEWS COMPARISON:  Prior left hip series same day. FINDINGS: Left hip replacement. Hardware intact. Anatomic alignment. No acute bony abnormality. Surgical staples noted over the left hip. Prior right hip replacement. IMPRESSION: Left hip replacement. Hardware intact. Anatomic alignment. Hardware intact. Electronically Signed   By: Travis [name]  Register   On: 06/06/2019 15:20   DG C-Arm 1-60 Min  Result Date: 06/06/2019 CLINICAL DATA:  Status post left hip replacement EXAM: OPERATIVE LEFT HIP WITH PELVIS; DG C-ARM 1-60 MIN COMPARISON:  None. FLUOROSCOPY TIME:  Radiation Exposure Index (as provided by the fluoroscopic device): Not available If the device does not provide the exposure index: Fluoroscopy Time:  26 seconds Number of Acquired Images:  4 FINDINGS: Left hip replacement is now visualized in satisfactory position. No acute bony or soft tissue abnormality is noted. IMPRESSION: Status post left hip replacement Electronically Signed   By: Travis [name] M.D.   On: 06/06/2019 19:00   DG HIP OPERATIVE UNILAT WITH PELVIS LEFT  Result Date: 06/06/2019 CLINICAL DATA:  Status post left hip replacement EXAM:  OPERATIVE LEFT HIP WITH PELVIS; DG C-ARM 1-60 MIN COMPARISON:  None. FLUOROSCOPY TIME:  Radiation Exposure Index (as provided by the fluoroscopic device): Not available If the device does not provide the exposure index: Fluoroscopy Time:  26 seconds Number of Acquired Images:  4 FINDINGS: Left hip replacement is now visualized in satisfactory position. No acute bony or soft tissue abnormality is noted. IMPRESSION: Status post left hip replacement Electronically Signed   By: Travis [name] M.D.   On: 06/06/2019 19:00    Disposition: Discharge disposition: 03-Skilled [place] Follow up.   Specialty: Home Health Services Why: Physical therapist will contact you for home visits.  Contact information: [address] [place] [address] [phone]        Mcarthur Rossetti, MD. Schedule an appointment as soon as possible for a visit in 2 week(s).   Specialty: Orthopedic Surgery Contact information: [address] [place] [address] [phone]            Signed: Erskine Emery 06/11/2019, 9:32 AM

## 2019-06-11 NOTE — TOC Transition Note (Signed)
Transition of Care [facility]) - CM/SW Discharge Note   Patient Details  Name: Travis HARKLEROAD Sr. MRN: [id number] Date of Birth: June 15, 1950  [facility]) CM/SW Contact:  Atilano Median, LCSW Phone Number: 06/11/2019, 10:49 AM   Clinical Narrative:    Discharged to [facility]. Referral coordinated with Juliann Pulse. Patient aware and agreeable to this plan. Number to call report [phone] room 124 given to unit RN Lexi. No other needs at this time. Case closed to this CSW.    Final next level of care: Skilled Nursing Facility Barriers to Discharge: Barriers Resolved   Patient Goals and CMS Choice Patient states their goals for this hospitalization and ongoing recovery are:: to get back home CMS Medicare.gov Compare Post Acute Care list provided to:: Patient Choice offered to / list presented to : Patient  Discharge Placement   Existing PASRR number confirmed : 06/11/19          Patient chooses bed at: [facility] Patient to be transferred to facility by: [place] Name of family member notified: patient Patient and family notified of of transfer: 06/11/19  Discharge Plan and Services   Discharge Planning Services: CM Consult Post Acute Care Choice: [place]: PT [facility] Date [place]: 06/06/19 Time [place]: 1544 Representative spoke with at [place]: [place]  Social Determinants of Health ([place]) Interventions     Readmission Risk Interventions No flowsheet data found.

## 2019-06-12 ENCOUNTER — Other Ambulatory Visit: Payer: Self-pay | Admitting: Cardiovascular Disease

## 2019-06-12 DIAGNOSIS — E119 Type 2 diabetes mellitus without complications: Secondary | ICD-10-CM | POA: Diagnosis not present

## 2019-06-12 DIAGNOSIS — Z96642 Presence of left artificial hip joint: Secondary | ICD-10-CM | POA: Diagnosis not present

## 2019-06-12 DIAGNOSIS — G4733 Obstructive sleep apnea (adult) (pediatric): Secondary | ICD-10-CM | POA: Diagnosis not present

## 2019-06-13 DIAGNOSIS — M545 Low back pain: Secondary | ICD-10-CM | POA: Diagnosis not present

## 2019-06-13 DIAGNOSIS — R1013 Epigastric pain: Secondary | ICD-10-CM | POA: Diagnosis not present

## 2019-06-13 DIAGNOSIS — Z89511 Acquired absence of right leg below knee: Secondary | ICD-10-CM | POA: Diagnosis not present

## 2019-06-13 DIAGNOSIS — G8929 Other chronic pain: Secondary | ICD-10-CM | POA: Diagnosis not present

## 2019-06-13 DIAGNOSIS — Z96642 Presence of left artificial hip joint: Secondary | ICD-10-CM | POA: Diagnosis not present

## 2019-06-17 ENCOUNTER — Ambulatory Visit: Payer: Medicare HMO | Admitting: Physician Assistant

## 2019-06-17 ENCOUNTER — Other Ambulatory Visit (HOSPITAL_COMMUNITY): Payer: Self-pay | Admitting: Psychiatry

## 2019-06-17 ENCOUNTER — Other Ambulatory Visit: Payer: Self-pay | Admitting: Internal Medicine

## 2019-06-17 ENCOUNTER — Other Ambulatory Visit: Payer: Self-pay

## 2019-06-17 DIAGNOSIS — F319 Bipolar disorder, unspecified: Secondary | ICD-10-CM

## 2019-06-17 DIAGNOSIS — F411 Generalized anxiety disorder: Secondary | ICD-10-CM

## 2019-06-17 NOTE — Patient Outreach (Signed)
[facility]) Care Management  06/17/2019  Travis LISBON Sr. 1950-09-19 [id number]   EMMI- General Discharge RED ON EMMI ALERT Day # 4 Date: 06/16/19 Red Alert Reason:  Sad/hopeless/ anxious/empty? Yes  Patient discharged to [facility] per chart.  Telephone call to [facility]. Patient remains inpatient there.    Plan: RN CM will close case.   Jone Baseman, RN, MSN [facility] Care Management Care Management Coordinator Direct Line [phone] Toll Free: [phone]  Fax: [phone]

## 2019-06-19 ENCOUNTER — Telehealth: Payer: Self-pay | Admitting: Orthopaedic Surgery

## 2019-06-19 DIAGNOSIS — L97129 Non-pressure chronic ulcer of left thigh with unspecified severity: Secondary | ICD-10-CM | POA: Diagnosis not present

## 2019-06-19 NOTE — Telephone Encounter (Signed)
Patient called and stated that he is in [place] and he has an appt on 06/23/19 and the facility will not let him come and then go back to facility.  Call patient to advise.  [phone]

## 2019-06-19 NOTE — Telephone Encounter (Signed)
See below

## 2019-06-19 NOTE — Telephone Encounter (Signed)
Patient aware of the below message  

## 2019-06-19 NOTE — Telephone Encounter (Signed)
We will need to see him at some point.  I would rather them not remove any staples until we see him since he is a larger gentleman.  They can stay in for at least 1 more week but by then they can either remove them or we need to see him in the office.

## 2019-06-20 DIAGNOSIS — G8929 Other chronic pain: Secondary | ICD-10-CM | POA: Diagnosis not present

## 2019-06-20 DIAGNOSIS — Z89511 Acquired absence of right leg below knee: Secondary | ICD-10-CM | POA: Diagnosis not present

## 2019-06-20 DIAGNOSIS — Z96642 Presence of left artificial hip joint: Secondary | ICD-10-CM | POA: Diagnosis not present

## 2019-06-20 DIAGNOSIS — M545 Low back pain: Secondary | ICD-10-CM | POA: Diagnosis not present

## 2019-06-23 ENCOUNTER — Ambulatory Visit: Payer: Medicare HMO | Admitting: Orthopaedic Surgery

## 2019-06-26 DIAGNOSIS — L97119 Non-pressure chronic ulcer of right thigh with unspecified severity: Secondary | ICD-10-CM | POA: Diagnosis not present

## 2019-06-27 DIAGNOSIS — M961 Postlaminectomy syndrome, not elsewhere classified: Secondary | ICD-10-CM | POA: Diagnosis not present

## 2019-06-27 DIAGNOSIS — F331 Major depressive disorder, recurrent, moderate: Secondary | ICD-10-CM | POA: Diagnosis not present

## 2019-06-27 DIAGNOSIS — Z89511 Acquired absence of right leg below knee: Secondary | ICD-10-CM | POA: Diagnosis not present

## 2019-06-27 DIAGNOSIS — I2510 Atherosclerotic heart disease of native coronary artery without angina pectoris: Secondary | ICD-10-CM | POA: Diagnosis not present

## 2019-06-27 DIAGNOSIS — E785 Hyperlipidemia, unspecified: Secondary | ICD-10-CM | POA: Diagnosis not present

## 2019-06-27 DIAGNOSIS — F411 Generalized anxiety disorder: Secondary | ICD-10-CM | POA: Diagnosis not present

## 2019-06-27 DIAGNOSIS — M1612 Unilateral primary osteoarthritis, left hip: Secondary | ICD-10-CM | POA: Diagnosis not present

## 2019-06-27 DIAGNOSIS — G546 Phantom limb syndrome with pain: Secondary | ICD-10-CM | POA: Diagnosis not present

## 2019-06-27 DIAGNOSIS — G894 Chronic pain syndrome: Secondary | ICD-10-CM | POA: Diagnosis not present

## 2019-06-27 DIAGNOSIS — M47816 Spondylosis without myelopathy or radiculopathy, lumbar region: Secondary | ICD-10-CM | POA: Diagnosis not present

## 2019-06-27 DIAGNOSIS — I110 Hypertensive heart disease with heart failure: Secondary | ICD-10-CM | POA: Diagnosis not present

## 2019-06-27 DIAGNOSIS — K219 Gastro-esophageal reflux disease without esophagitis: Secondary | ICD-10-CM | POA: Diagnosis not present

## 2019-06-27 DIAGNOSIS — M6281 Muscle weakness (generalized): Secondary | ICD-10-CM | POA: Diagnosis not present

## 2019-06-30 ENCOUNTER — Inpatient Hospital Stay: Payer: Medicare HMO | Admitting: Physician Assistant

## 2019-06-30 DIAGNOSIS — G4700 Insomnia, unspecified: Secondary | ICD-10-CM | POA: Diagnosis not present

## 2019-06-30 DIAGNOSIS — I509 Heart failure, unspecified: Secondary | ICD-10-CM | POA: Diagnosis not present

## 2019-06-30 DIAGNOSIS — Z471 Aftercare following joint replacement surgery: Secondary | ICD-10-CM | POA: Diagnosis not present

## 2019-06-30 DIAGNOSIS — G4733 Obstructive sleep apnea (adult) (pediatric): Secondary | ICD-10-CM | POA: Diagnosis not present

## 2019-06-30 DIAGNOSIS — G894 Chronic pain syndrome: Secondary | ICD-10-CM | POA: Diagnosis not present

## 2019-06-30 DIAGNOSIS — I2510 Atherosclerotic heart disease of native coronary artery without angina pectoris: Secondary | ICD-10-CM | POA: Diagnosis not present

## 2019-06-30 DIAGNOSIS — I110 Hypertensive heart disease with heart failure: Secondary | ICD-10-CM | POA: Diagnosis not present

## 2019-06-30 DIAGNOSIS — M797 Fibromyalgia: Secondary | ICD-10-CM | POA: Diagnosis not present

## 2019-06-30 DIAGNOSIS — E119 Type 2 diabetes mellitus without complications: Secondary | ICD-10-CM | POA: Diagnosis not present

## 2019-07-01 DIAGNOSIS — M797 Fibromyalgia: Secondary | ICD-10-CM | POA: Diagnosis not present

## 2019-07-01 DIAGNOSIS — G4700 Insomnia, unspecified: Secondary | ICD-10-CM | POA: Diagnosis not present

## 2019-07-01 DIAGNOSIS — G894 Chronic pain syndrome: Secondary | ICD-10-CM | POA: Diagnosis not present

## 2019-07-01 DIAGNOSIS — E119 Type 2 diabetes mellitus without complications: Secondary | ICD-10-CM | POA: Diagnosis not present

## 2019-07-01 DIAGNOSIS — I2510 Atherosclerotic heart disease of native coronary artery without angina pectoris: Secondary | ICD-10-CM | POA: Diagnosis not present

## 2019-07-01 DIAGNOSIS — G4733 Obstructive sleep apnea (adult) (pediatric): Secondary | ICD-10-CM | POA: Diagnosis not present

## 2019-07-01 DIAGNOSIS — I110 Hypertensive heart disease with heart failure: Secondary | ICD-10-CM | POA: Diagnosis not present

## 2019-07-01 DIAGNOSIS — I509 Heart failure, unspecified: Secondary | ICD-10-CM | POA: Diagnosis not present

## 2019-07-01 DIAGNOSIS — Z471 Aftercare following joint replacement surgery: Secondary | ICD-10-CM | POA: Diagnosis not present

## 2019-07-02 ENCOUNTER — Telehealth: Payer: Self-pay | Admitting: Physician Assistant

## 2019-07-02 ENCOUNTER — Ambulatory Visit (INDEPENDENT_AMBULATORY_CARE_PROVIDER_SITE_OTHER): Payer: Medicare HMO | Admitting: Physician Assistant

## 2019-07-02 ENCOUNTER — Other Ambulatory Visit: Payer: Self-pay

## 2019-07-02 ENCOUNTER — Encounter: Payer: Self-pay | Admitting: Physician Assistant

## 2019-07-02 DIAGNOSIS — G4733 Obstructive sleep apnea (adult) (pediatric): Secondary | ICD-10-CM | POA: Diagnosis not present

## 2019-07-02 DIAGNOSIS — R6889 Other general symptoms and signs: Secondary | ICD-10-CM | POA: Diagnosis not present

## 2019-07-02 DIAGNOSIS — Z96642 Presence of left artificial hip joint: Secondary | ICD-10-CM

## 2019-07-02 MED ORDER — DOXYCYCLINE HYCLATE 100 MG PO TABS: 100.0000 mg | ORAL_TABLET | Freq: Two times a day (BID) | ORAL | 0 refills | Status: AC

## 2019-07-02 MED ORDER — MUPIROCIN 2 % EX OINT: 1.0000 "application " | TOPICAL_OINTMENT | Freq: Two times a day (BID) | CUTANEOUS | 0 refills | Status: DC

## 2019-07-02 NOTE — Progress Notes (Signed)
Office Visit Note   Patient: Travis YOUNGBLOOD Sr.           Date of Birth: 11-19-1950           MRN: [id number] Visit Date: 07/02/2019              Requested by: [name], MD [place],  [place] [id number] PCP: [name], MD   Assessment & Plan: Visit Diagnoses:  1. Status post total replacement of left hip     Plan: We will place him on doxycycline 100 mg twice daily.  He will wash the wound with antibacterial soap daily and then apply a small amount of Bactroban to the proximal incision.  He is to keep this area clean and dry.  Steri-Strips were applied after removal of staples today.  Questions encouraged and answered.  We will see him back in 1 week for wound check.  Sooner if there is any questions or concerns.   Follow-Up Instructions: Return in about 1 week (around 07/09/2019) for wound check.   Orders:  No orders of the defined types were placed in this encounter.  Meds ordered this encounter  Medications  . doxycycline (VIBRA-TABS) 100 MG tablet    Sig: Take 1 tablet (100 mg total) by mouth 2 (two) times daily for 14 days.    Dispense:  28 tablet    Refill:  0  . mupirocin ointment (BACTROBAN) 2 %    Sig: Place 1 application into the nose 2 (two) times daily.    Dispense:  22 g    Refill:  0      Procedures: No procedures performed   Clinical Data: No additional findings.   Subjective: Chief Complaint  Patient presents with  . Left Hip - Routine Post Op    HPI Travis [name] returns today 3 weeks 5 days status post left total hip arthroplasty.  He is coming in for his first postop visit had transportation issues.  States overall the hip is doing well.  He is having no pain.  He had no fevers or chills. Review of Systems See HPI  Objective: Vital Signs: There were no vitals taken for this visit.  Physical Exam Constitutional:      Appearance: He is not ill-appearing or diaphoretic.  Pulmonary:     Effort: Pulmonary effort  is normal.  Neurological:     Mental Status: He is alert and oriented to person, place, and time.  Psychiatric:        Mood and Affect: Mood normal.     Ortho Exam Left hip staples well approximate the wound.  There is slight dehiscence at the proximal portion of the wound no evidence of gross infection.  Slight erythema distal incision.  This could be staple irritation due to the fact that just with 25 days since patient surgery.  Positive seroma 180 cc of serous sanguinous aspirate obtained.  Patient tolerated well.  Left hip good range of motion ambulates with a cane.  Specialty Comments:  No specialty comments available.  Imaging: No results found.   PMFS History: Patient Active Problem List   Diagnosis Date Noted  . Unilateral primary osteoarthritis, left hip 06/06/2019  . Status post total replacement of left hip 06/06/2019  . S/P laparoscopic sleeve gastrectomy 03/03/2019  . Rash 01/24/2019  . Fever 08/15/2018  . UTI (urinary tract infection) 08/15/2018  . Atypical chest pain 08/15/2018  . Fall at home, initial encounter 08/15/2018  .  Chronic anemia 08/15/2018  . Hypokalemia 08/15/2018  . Bowel incontinence 07/25/2018  . Other spondylosis with radiculopathy, lumbar region 07/16/2018    Class: Chronic  . Status post lumbar laminectomy 07/16/2018  . Status post THR (total hip replacement) 04/17/2018  . Unilateral primary osteoarthritis, right hip   . Morbid obesity with BMI of 40.0-44.9, adult ([place]) 02/28/2018  . Myocardial infarction ([place]) 02/25/2018  . Coronary artery disease involving native coronary artery of native heart 02/25/2018  . PAD (peripheral artery disease) ([place]) 02/25/2018  . S/P BKA (below knee amputation) unilateral, right ([place]) 02/25/2018  . Sacroiliitis ([place]) 02/25/2018  . SOB (shortness of breath) 01/03/2018  . Acute on chronic heart failure ([place]) 01/03/2018  . Severe right groin pain 12/20/2017  . Morbid obesity ([place]) 10/09/2017  . Exposure to  sexually transmitted disease (STD) 07/14/2017  . Low back pain 07/13/2017  . Leukoplakia, tongue 01/26/2017  . Acute sinus infection 10/20/2016  . Hyperglycemia 10/20/2016  . Skin lesion 10/20/2016  . S/P unilateral BKA (below knee amputation), right ([place]) 06/14/2016  . Charcot foot due to diabetes mellitus ([place])   . Charcot's arthropathy associated with type 2 diabetes mellitus ([place]) 04/11/2016  . Preventative health care 11/05/2015  . Major depression 09/13/2015  . S/P TKR (total knee replacement) bilaterally 09/13/2015  . GERD (gastroesophageal reflux disease) 09/08/2015  . S/P laparoscopic hernia repair 05/11/2015  . PPD positive 04/08/2015  . Benign neoplasm of descending colon   . Benign neoplasm of cecum   . Acute blood loss anemia   . Chronic anticoagulation   . Occult blood positive stool 10/17/2014  . General weakness 07/14/2014  . Urinary incontinence 07/14/2014  . Headache(784.0) 10/15/2013  . Spinal stenosis in cervical region 09/26/2013    Class: Chronic  . Spinal stenosis of lumbar region 09/26/2013    Class: Chronic  . Hand joint pain 06/10/2013  . Rotator cuff tear arthropathy of both shoulders 06/10/2013  . Skin lesion of cheek 05/01/2013  . Pain of right thumb 04/03/2013  . Balance disorder 03/12/2013  . Gait disorder 03/12/2013  . Tremor 03/12/2013  . Left hip pain 03/12/2013  . Pre-ulcerative corn or callous 02/06/2013  . Anxiety 11/12/2011  . OSA on CPAP 11/07/2011  . Bradycardia 10/20/2011  . Insomnia 10/04/2011  . Obesity 01/12/2011  . Diabetes mellitus type 2 in obese ([place]) 09/27/2010  . ERECTILE DYSFUNCTION, ORGANIC 05/30/2010  . [place] DISEASE, LUMBAR 04/19/2010  . SCIATICA, LEFT 04/19/2010  . Chronic pain syndrome 10/27/2009  . Hyperlipidemia 07/15/2009  . Essential hypertension 06/24/2009  . Coronary artery disease involving native coronary artery of native heart without angina pectoris 06/24/2009  . Allergic rhinitis 06/24/2009  . URETHRAL  STRICTURE 06/24/2009  . DEGENERATIVE JOINT DISEASE 06/24/2009  . SHOULDER PAIN, BILATERAL 06/24/2009  . FATIGUE 06/24/2009  . NEPHROLITHIASIS, HX OF 06/24/2009   Past Medical History:  Diagnosis Date  . ALLERGIC RHINITIS 06/24/2009  . Allergy   . Anxiety 11/12/2011   Adequate for discharge   . Arthritis    "all my joints" (09/30/2013)  . Arthritis of foot, right, degenerative 04/15/2014  . Balance disorder 03/12/2013  . Benign neoplasm of cecum   . Benign neoplasm of descending colon   . CAD (coronary artery disease) 06/24/2009   5 stents placed in 2007    . Chronic anticoagulation   . Chronic pain syndrome 10/27/2009   of ankle, shoulders, low back.  sciatica.   . Closed fracture of right foot 10/17/2014  . CORONARY ARTERY DISEASE  06/24/2009   a. s/p multiple PCIs - In 2006/07/22 he had a Taxus DES to the mild LAD, Endeavor DES to mid LCX and distal LCX. In January 2009 he had DES to distal LCX, mid LCX and proximal LCX. In November 2009 had BMS x 2 to the mid RCA. Cath 10/2011 with patent stents, noncardiac CP. LHC 01/2013: patent stents (noncardiac CP).  . DEGENERATIVE JOINT DISEASE 06/24/2009   Qualifier: Diagnosis of  By: [name], [name]   . Depression   . Depression with anxiety    Prior suicide attempt  . [place] DISEASE, LUMBAR 04/19/2010  . ERECTILE DYSFUNCTION, ORGANIC 05/30/2010  . Essential hypertension 06/24/2009   Qualifier: Diagnosis of  By: [name], [name]   . Fibromyalgia   . Fracture dislocation of ankle joint 09/02/2015  . Gait disorder 03/12/2013  . General weakness 07/14/2014  . GERD (gastroesophageal reflux disease) 09/08/2015  . Hand joint pain 06/10/2013  . Heart murmur   . Hepatitis C    treated pt. unknown with what he was a teenager  . History of kidney stones   . Hyperlipidemia 07/15/2009   Qualifier: Diagnosis of  By: Aundra Dubin, MD, [place]    . HYPERLIPIDEMIA-MIXED 07/15/2009  . HYPERTENSION 06/24/2009  . Insomnia 10/04/2011  . Irregular heart beat   . Left hip pain  03/12/2013   Injected under ultrasound guidance on June 24, 2013   . Major depression 09/13/2015  . Myocardial infarction ([place]) 07-22-06  . Non-cardiac chest pain 10/2011, 01/2013  . Obesity   . Occult blood positive stool 10/17/2014  . Open ankle fracture 09/02/2015  . OSA (obstructive sleep apnea)    not using CPAP (09/30/2013)  . Pain of right thumb 04/03/2013  . Pneumonia   . PPD positive 04/08/2015  . Pre-ulcerative corn or callous 02/06/2013  . Rotator cuff tear arthropathy of both shoulders 06/10/2013   History of bilateral shoulder cuff surgery for rotator cuff tears. Reports increase in pain 09/11/2015 during physical therapy of the left shoulder.   [name] SCIATICA, LEFT 04/19/2010   Qualifier: Diagnosis of  By: [name], [name]   . Sleep apnea    wears cpap   . Spinal stenosis in cervical region 09/26/2013  . Spinal stenosis, lumbar region, with neurogenic claudication 09/26/2013  . Type II diabetes mellitus ([place]) 2010/07/22   no meds in 09/2014.   [name] Uncontrolled type 2 DM with peripheral circulatory disorder ([place]) 10/04/2013  . URETHRAL STRICTURE 06/24/2009   self catheterizes.     Family History  Problem Relation Age of Onset  . Depression Mother   . Heart disease Mother   . Hypertension Mother   . Breast cancer Mother   . Cancer Mother   . Diabetes Father   . Heart disease Father        CABG  . Hypertension Father   . Hyperlipidemia Father   . Prostate cancer Father   . Skin cancer Father   . Depression Brother        x 2  . Hypertension Brother        x2  . Healthy Son   . Heart disease Maternal Grandfather   . Early death Maternal Grandfather   . Heart attack Maternal Grandfather 2063/07/23  . Early death Paternal Grandfather   . Coronary artery disease Other   . Hypertension Other   . Depression Other   . Healthy Son   . Colon cancer Neg Hx   . Colon polyps Neg  Hx   . Esophageal cancer Neg Hx   . Rectal cancer Neg Hx   . Stomach cancer Neg Hx     Past Surgical History:   Procedure Laterality Date  . AMPUTATION Right 06/14/2016   Procedure: AMPUTATION BELOW KNEE;  Surgeon: Newt Minion, MD;  Location: [facility];  Service: Orthopedics;  Laterality: Right;  . ANKLE FUSION Right 04/15/2014   Procedure: Right Subtalar, Talonavicular Fusion;  Surgeon: Newt Minion, MD;  Location: [facility];  Service: Orthopedics;  Laterality: Right;  . ANKLE FUSION Right 04/18/2016   Procedure: Right Ankle Tibiocalcaneal Fusion;  Surgeon: Newt Minion, MD;  Location: [facility];  Service: Orthopedics;  Laterality: Right;  . ANTERIOR CERVICAL DECOMP/DISCECTOMY FUSION N/A 09/26/2013   Procedure: ANTERIOR CERVICAL DISCECTOMY FUSION C3-4, plate and screw fixation, allograft bone graft;  Surgeon: Jessy Oto, MD;  Location: [facility];  Service: Orthopedics;  Laterality: N/A;  . BACK SURGERY    . BELOW KNEE LEG AMPUTATION Right 06/14/2016   right ankle and foot  . CARDIAC CATHETERIZATION  X 1  . CARPAL TUNNEL RELEASE Bilateral   . COLONOSCOPY    . COLONOSCOPY N/A 10/22/2014   Procedure: COLONOSCOPY;  Surgeon: Lafayette Dragon, MD;  Location: [facility] ENDOSCOPY;  Service: Endoscopy;  Laterality: N/A;  . CORONARY ANGIOPLASTY WITH STENT PLACEMENT     "I have 9 stents"  . ESOPHAGOGASTRODUODENOSCOPY N/A 10/19/2014   Procedure: ESOPHAGOGASTRODUODENOSCOPY (EGD);  Surgeon: Jerene Bears, MD;  Location: [facility] ENDOSCOPY;  Service: Endoscopy;  Laterality: N/A;  . FRACTURE SURGERY    . FUSION OF TALONAVICULAR JOINT Right 04/15/2014   dr duda  . HERNIA REPAIR     umbilical  . INGUINAL HERNIA REPAIR Right 05/11/2015   Procedure: LAPAROSCOPIC REPAIR RIGHT  INGUINAL HERNIA;  Surgeon: Greer Pickerel, MD;  Location: [place];  Service: General;  Laterality: Right;  . INSERTION OF MESH Right 05/11/2015   Procedure: INSERTION OF MESH;  Surgeon: Greer Pickerel, MD;  Location: [place];  Service: General;  Laterality: Right;  . JOINT REPLACEMENT    . KNEE CARTILAGE SURGERY Right X 12   "~ 1/2 open; ~ 1/2 scopes"  . KNEE CARTILAGE SURGERY  Left X 3   "3 scopes"  . LAPAROSCOPIC GASTRIC SLEEVE RESECTION N/A 03/03/2019   Procedure: LAPAROSCOPIC GASTRIC SLEEVE RESECTION, Upper Endo, ERAS Pathway;  Surgeon: Greer Pickerel, MD;  Location: [facility];  Service: General;  Laterality: N/A;  . LEFT HEART CATHETERIZATION WITH CORONARY ANGIOGRAM N/A 02/10/2013   Procedure: LEFT HEART CATHETERIZATION WITH CORONARY ANGIOGRAM;  Surgeon: Burnell Blanks, MD;  Location: [facility] CATH LAB;  Service: Cardiovascular;  Laterality: N/A;  . LUMBAR LAMINECTOMY N/A 07/16/2018   Procedure: LEFT L4-5 REDO PARTIAL LUMBAR HEMILAMINECTOMY WITH FORAMINOTOMY LEFT L4;  Surgeon: Jessy Oto, MD;  Location: [place];  Service: Orthopedics;  Laterality: N/A;  . LUMBAR LAMINECTOMY/DECOMPRESSION MICRODISCECTOMY N/A 01/27/2014   Procedure: CENTRAL LUMBAR LAMINECTOMY L4-5 AND L3-4;  Surgeon: Jessy Oto, MD;  Location: [place];  Service: Orthopedics;  Laterality: N/A;  . ORIF ANKLE FRACTURE Right 09/02/2015   Procedure: OPEN REDUCTION INTERNAL FIXATION (ORIF) ANKLE FRACTURE;  Surgeon: Newt Minion, MD;  Location: [place];  Service: Orthopedics;  Laterality: Right;  . PERIPHERALLY INSERTED CENTRAL CATHETER INSERTION  09/02/2015  . POLYPECTOMY    . SHOULDER ARTHROSCOPY W/ ROTATOR CUFF REPAIR Bilateral    "3 on the right; 1 on the left"  . SKIN SPLIT GRAFT Right 10/01/2015   Procedure: RIGHT ANKLE  APPLY SKIN GRAFT SPLIT THICKNESS;  Surgeon: Newt Minion, MD;  Location: [place];  Service: Orthopedics;  Laterality: Right;  . TONSILLECTOMY    . TOOTH EXTRACTION    . TOTAL HIP ARTHROPLASTY Right 04/17/2018  . TOTAL HIP ARTHROPLASTY Right 04/17/2018   Procedure: RIGHT TOTAL HIP ARTHROPLASTY;  Surgeon: Newt Minion, MD;  Location: [place];  Service: Orthopedics;  Laterality: Right;  . TOTAL HIP ARTHROPLASTY Left 06/06/2019   Procedure: LEFT TOTAL HIP ARTHROPLASTY ANTERIOR APPROACH;  Surgeon: Mcarthur Rossetti, MD;  Location: [facility];  Service: Orthopedics;  Laterality: Left;  . TOTAL  KNEE ARTHROPLASTY Bilateral 2008  . UMBILICAL HERNIA REPAIR     UHR  . UPPER GASTROINTESTINAL ENDOSCOPY    . URETHRAL DILATION  X 4  . VASECTOMY    . WISDOM TOOTH EXTRACTION     Social History   Occupational History  . Occupation: disabled since 2006 due to ortho. heart, psych    Employer: UNEMPLOYED  . Occupation: part time work as an Multimedia programmer, wrestling, and Holiday representative  Tobacco Use  . Smoking status: Former Smoker    Types: Cigars    Quit date: 08/28/2010    Years since quitting: 8.8  . Smokeless tobacco: Never Used  . Tobacco comment: 04/18/2016 "smoked 1 cigar/wk when I did smoke"  Substance and Sexual Activity  . Alcohol use: Not Currently    Alcohol/week: 0.0 standard drinks    Comment: rarely  . Drug use: No  . Sexual activity: Not Currently

## 2019-07-02 NOTE — Telephone Encounter (Signed)
Travis [name] from [facility] called. She would like verbal orders 1x wk 1 wk and wound care orders. Her call back number is [phone]

## 2019-07-02 NOTE — Patient Outreach (Signed)
[facility]) Care Management  07/02/2019  Travis [name] Sr. 09-26-1950 [id number]   EMMI- General Discharge RED ON EMMI ALERT Day # 1 Date: 07/01/19 Red Alert Reason:  Got discharge papers? No  Outreach attempt: Spoke with patient. He states he cannot talk right now as he is waiting for his transportation any minute for an appointment today.  Advised patient that CM would call another time.  He is agreeable.  Plan: RN CM will attempt patient again within 4 business days.    Travis Baseman, RN, MSN [facility] Care Management Care Management Coordinator Direct Line [phone] Toll Free: [phone]  Fax: [phone]

## 2019-07-02 NOTE — Telephone Encounter (Signed)
Verbal orders left on VM 

## 2019-07-03 ENCOUNTER — Other Ambulatory Visit: Payer: Self-pay

## 2019-07-03 ENCOUNTER — Telehealth: Payer: Self-pay | Admitting: Orthopaedic Surgery

## 2019-07-03 DIAGNOSIS — G4733 Obstructive sleep apnea (adult) (pediatric): Secondary | ICD-10-CM | POA: Diagnosis not present

## 2019-07-03 DIAGNOSIS — I2510 Atherosclerotic heart disease of native coronary artery without angina pectoris: Secondary | ICD-10-CM | POA: Diagnosis not present

## 2019-07-03 DIAGNOSIS — Z471 Aftercare following joint replacement surgery: Secondary | ICD-10-CM | POA: Diagnosis not present

## 2019-07-03 DIAGNOSIS — I509 Heart failure, unspecified: Secondary | ICD-10-CM | POA: Diagnosis not present

## 2019-07-03 DIAGNOSIS — I110 Hypertensive heart disease with heart failure: Secondary | ICD-10-CM | POA: Diagnosis not present

## 2019-07-03 DIAGNOSIS — M797 Fibromyalgia: Secondary | ICD-10-CM | POA: Diagnosis not present

## 2019-07-03 DIAGNOSIS — E119 Type 2 diabetes mellitus without complications: Secondary | ICD-10-CM | POA: Diagnosis not present

## 2019-07-03 DIAGNOSIS — G4700 Insomnia, unspecified: Secondary | ICD-10-CM | POA: Diagnosis not present

## 2019-07-03 DIAGNOSIS — G894 Chronic pain syndrome: Secondary | ICD-10-CM | POA: Diagnosis not present

## 2019-07-03 NOTE — Patient Outreach (Signed)
[facility]) Care Management  07/03/2019  Travis REGER Sr. 09/03/1950 [id number]   EMMI- General Discharge RED ON EMMI ALERT Day # 1 Date: 07/01/19 Red Alert Reason:  Got discharge papers? No  Outreach attempt: Spoke with patient. He reports doing good since being home.  He states that he has home health of OT and PT.  He states he had his staples removed yesterday from the doctor.  He states he has one small one area. He denies any drainage from area, redness, or swelling.  Discussed signs of infection. He verbalized understanding.  Addressed red alert. Patient states that was a mistake and that he has discharge information and denies any issues presently. Patient declined further nurse follow up at this time.     Plan: RN CM will close case.    Jone Baseman, RN, MSN [facility] Care Management Care Management Coordinator Direct Line [phone] Toll Free: [phone]  Fax: [phone]

## 2019-07-03 NOTE — Telephone Encounter (Signed)
Verbal order given  

## 2019-07-03 NOTE — Telephone Encounter (Signed)
Erin from [place] at [facility] verbal order for the patient's post op PT. 2wk2 & 1wk2   Call back number: [phone]

## 2019-07-04 DIAGNOSIS — G4700 Insomnia, unspecified: Secondary | ICD-10-CM | POA: Diagnosis not present

## 2019-07-04 DIAGNOSIS — G894 Chronic pain syndrome: Secondary | ICD-10-CM | POA: Diagnosis not present

## 2019-07-04 DIAGNOSIS — E119 Type 2 diabetes mellitus without complications: Secondary | ICD-10-CM | POA: Diagnosis not present

## 2019-07-04 DIAGNOSIS — I509 Heart failure, unspecified: Secondary | ICD-10-CM | POA: Diagnosis not present

## 2019-07-04 DIAGNOSIS — I110 Hypertensive heart disease with heart failure: Secondary | ICD-10-CM | POA: Diagnosis not present

## 2019-07-04 DIAGNOSIS — M797 Fibromyalgia: Secondary | ICD-10-CM | POA: Diagnosis not present

## 2019-07-04 DIAGNOSIS — I2510 Atherosclerotic heart disease of native coronary artery without angina pectoris: Secondary | ICD-10-CM | POA: Diagnosis not present

## 2019-07-04 DIAGNOSIS — Z471 Aftercare following joint replacement surgery: Secondary | ICD-10-CM | POA: Diagnosis not present

## 2019-07-04 DIAGNOSIS — G4733 Obstructive sleep apnea (adult) (pediatric): Secondary | ICD-10-CM | POA: Diagnosis not present

## 2019-07-07 DIAGNOSIS — N3941 Urge incontinence: Secondary | ICD-10-CM | POA: Diagnosis not present

## 2019-07-07 DIAGNOSIS — R6889 Other general symptoms and signs: Secondary | ICD-10-CM | POA: Diagnosis not present

## 2019-07-07 DIAGNOSIS — R3915 Urgency of urination: Secondary | ICD-10-CM | POA: Diagnosis not present

## 2019-07-07 DIAGNOSIS — N35011 Post-traumatic bulbous urethral stricture: Secondary | ICD-10-CM | POA: Diagnosis not present

## 2019-07-08 DIAGNOSIS — Z471 Aftercare following joint replacement surgery: Secondary | ICD-10-CM | POA: Diagnosis not present

## 2019-07-08 DIAGNOSIS — I2510 Atherosclerotic heart disease of native coronary artery without angina pectoris: Secondary | ICD-10-CM | POA: Diagnosis not present

## 2019-07-08 DIAGNOSIS — G4700 Insomnia, unspecified: Secondary | ICD-10-CM | POA: Diagnosis not present

## 2019-07-08 DIAGNOSIS — G4733 Obstructive sleep apnea (adult) (pediatric): Secondary | ICD-10-CM | POA: Diagnosis not present

## 2019-07-08 DIAGNOSIS — I509 Heart failure, unspecified: Secondary | ICD-10-CM | POA: Diagnosis not present

## 2019-07-08 DIAGNOSIS — I110 Hypertensive heart disease with heart failure: Secondary | ICD-10-CM | POA: Diagnosis not present

## 2019-07-08 DIAGNOSIS — E119 Type 2 diabetes mellitus without complications: Secondary | ICD-10-CM | POA: Diagnosis not present

## 2019-07-08 DIAGNOSIS — G894 Chronic pain syndrome: Secondary | ICD-10-CM | POA: Diagnosis not present

## 2019-07-08 DIAGNOSIS — M797 Fibromyalgia: Secondary | ICD-10-CM | POA: Diagnosis not present

## 2019-07-09 ENCOUNTER — Encounter: Payer: Self-pay | Admitting: Physician Assistant

## 2019-07-09 ENCOUNTER — Ambulatory Visit (INDEPENDENT_AMBULATORY_CARE_PROVIDER_SITE_OTHER): Payer: Medicare HMO | Admitting: Physician Assistant

## 2019-07-09 ENCOUNTER — Other Ambulatory Visit: Payer: Self-pay

## 2019-07-09 DIAGNOSIS — R6889 Other general symptoms and signs: Secondary | ICD-10-CM | POA: Diagnosis not present

## 2019-07-09 DIAGNOSIS — E119 Type 2 diabetes mellitus without complications: Secondary | ICD-10-CM | POA: Diagnosis not present

## 2019-07-09 DIAGNOSIS — I110 Hypertensive heart disease with heart failure: Secondary | ICD-10-CM | POA: Diagnosis not present

## 2019-07-09 DIAGNOSIS — M797 Fibromyalgia: Secondary | ICD-10-CM | POA: Diagnosis not present

## 2019-07-09 DIAGNOSIS — G4733 Obstructive sleep apnea (adult) (pediatric): Secondary | ICD-10-CM | POA: Diagnosis not present

## 2019-07-09 DIAGNOSIS — I2510 Atherosclerotic heart disease of native coronary artery without angina pectoris: Secondary | ICD-10-CM | POA: Diagnosis not present

## 2019-07-09 DIAGNOSIS — Z471 Aftercare following joint replacement surgery: Secondary | ICD-10-CM | POA: Diagnosis not present

## 2019-07-09 DIAGNOSIS — G894 Chronic pain syndrome: Secondary | ICD-10-CM | POA: Diagnosis not present

## 2019-07-09 DIAGNOSIS — Z96642 Presence of left artificial hip joint: Secondary | ICD-10-CM

## 2019-07-09 DIAGNOSIS — I509 Heart failure, unspecified: Secondary | ICD-10-CM | POA: Diagnosis not present

## 2019-07-09 DIAGNOSIS — G4700 Insomnia, unspecified: Secondary | ICD-10-CM | POA: Diagnosis not present

## 2019-07-09 NOTE — Progress Notes (Signed)
HPI: Mr. Fogel returns today follow-up for his left total hip.  Is for wound check.  He has been on doxycycline using Bactroban cream over the incision.  And no fevers chills.  Slowly trending towards improvement and working with therapy on range of motion strengthening.  Physical exam: Left hip surgical incisions well approximated.  Slight dehiscence at the very proximal wound.  No gross infection.  Erythema has dissipated at the distal end of the incision.  Left hip good range of motion.  Ambulates with a cane.  Impression: Status post left total hip arthroplasty 06/06/2019  Plan: He will continue to wash the area once daily with an antibacterial soap and apply small amount of Bactroban and proximal incision.  Will finish his doxycycline.  Follow-up with [place] in 2 weeks sooner if there is any questions concerns.

## 2019-07-11 ENCOUNTER — Telehealth: Payer: Self-pay | Admitting: Orthopaedic Surgery

## 2019-07-11 NOTE — Telephone Encounter (Signed)
April/[facility] called and stated needing extended order for Skilled Nursing for  1 week for 3 weeks.  Please call April [phone]

## 2019-07-11 NOTE — Telephone Encounter (Signed)
Verbal order given  

## 2019-07-15 ENCOUNTER — Ambulatory Visit (INDEPENDENT_AMBULATORY_CARE_PROVIDER_SITE_OTHER): Payer: Medicare HMO | Admitting: Physician Assistant

## 2019-07-15 ENCOUNTER — Encounter: Payer: Self-pay | Admitting: Orthopedic Surgery

## 2019-07-15 ENCOUNTER — Other Ambulatory Visit: Payer: Self-pay

## 2019-07-15 VITALS — Ht 72.0 in | Wt 300.0 lb

## 2019-07-15 DIAGNOSIS — Z89511 Acquired absence of right leg below knee: Secondary | ICD-10-CM

## 2019-07-15 DIAGNOSIS — R6889 Other general symptoms and signs: Secondary | ICD-10-CM | POA: Diagnosis not present

## 2019-07-15 DIAGNOSIS — E11610 Type 2 diabetes mellitus with diabetic neuropathic arthropathy: Secondary | ICD-10-CM | POA: Diagnosis not present

## 2019-07-15 NOTE — Progress Notes (Signed)
Office Visit Note   Patient: Travis CROM Sr.           Date of Birth: 01/03/1951           MRN: [id number] Visit Date: 07/15/2019              Requested by: [name], MD [place],  [place] [id number] PCP: [name], MD  Chief Complaint  Patient presents with  . Right Leg - Follow-up    06/14/16 right BKA       HPI: This is a pleasant gentleman who is 3 years status post right below-knee amputation.  He has recently lost 50 pounds through bariatric surgery.  His socket is now too big requiring 24 ply socks and is rotating making it unstable and unsafe for the patient  Assessment & Plan: Visit Diagnoses: No diagnosis found.  Plan: He is provided a new prescription for a new socket and supplies this is medically necessary. Patient is an existing right transtibial  amputee.  Patient's current comorbidities are not expected to impact the ability to function with the prescribed prosthesis. Patient verbally communicates a strong desire to use a prosthesis. Patient currently requires mobility aids to ambulate without a prosthesis.  Expects not to use mobility aids with a new prosthesis.  Patient is a K2 level ambulator that will use a prosthesis to walk around their home and the community over low level environmental barriers.    Follow-Up Instructions: No follow-ups on file.   Ortho Exam  Patient is alert, oriented, no adenopathy, well-dressed, normal affect, normal respiratory effort. Focused examination of the right amputation stump demonstrates no skin breakdown.  However the prosthetic is extremely large secondary to his recent weight loss and is unstable and he has had to alter his gait to stay safe  Imaging: No results found. No images are attached to the encounter.  Labs: Lab Results  Component Value Date   HGBA1C 5.8 (A) 01/24/2019   HGBA1C 5.2 06/03/2018   HGBA1C 5.5 01/10/2018   ESRSEDRATE 22 (H) 09/03/2018   ESRSEDRATE 35 (H)  08/15/2018   ESRSEDRATE 20 (H) 09/03/2013   CRP 7.3 09/03/2018   CRP 14.1 (H) 08/15/2018   CRP 10.7 (H) 09/03/2013   REPTSTATUS 08/16/2018 FINAL 08/15/2018   GRAMSTAIN  09/02/2013    WBC PRESENT, PREDOMINANTLY MONONUCLEAR NO ORGANISMS SEEN CYTOSPIN Performed by [facility] Performed at [place]  09/02/2013    NO ORGANISMS SEEN WBC PRESENT, PREDOMINANTLY MONONUCLEAR CYTOSPIN Gram Stain Report Called to,Read Back By and Verified With: A LEDWELL RN 1931 09/02/13 A NAVARRO   CULT (A) 08/15/2018    <10,000 COLONIES/mL INSIGNIFICANT GROWTH Performed at [facility], [place] [address] [place], [place] [address]    LABORGA STAPHYLOCOCCUS SPECIES (COAGULASE NEGATIVE) 04/08/2015     Lab Results  Component Value Date   ALBUMIN 3.8 03/04/2019   ALBUMIN 4.5 02/27/2019   ALBUMIN 3.4 (L) 08/15/2018    Lab Results  Component Value Date   MG 1.5 (L) 08/15/2018   MG 1.9 05/03/2013   No results found for: VD25OH  No results found for: PREALBUMIN CBC EXTENDED Latest Ref Rng & Units 06/09/2019 06/08/2019 06/07/2019  WBC 4.0 - 10.5 K/uL 6.3 7.6 7.5  RBC 4.22 - 5.81 MIL/uL 3.08(L) 3.27(L) 3.25(L)  HGB 13.0 - 17.0 g/dL 9.3(L) 9.7(L) 9.6(L)  HCT 39.0 - 52.0 % 27.5(L) 29.3(L) 28.7(L)  PLT 150 - 400 K/uL 252 227  229  NEUTROABS 1.7 - 7.7 K/uL - - -  LYMPHSABS 0.7 - 4.0 K/uL - - -     Body mass index is 40.69 kg/m.  Orders:  No orders of the defined types were placed in this encounter.  No orders of the defined types were placed in this encounter.    Procedures: No procedures performed  Clinical Data: No additional findings.  ROS:  All other systems negative, except as noted in the HPI. Review of Systems  Objective: Vital Signs: Ht 6' (1.829 m)   Wt 300 lb (136.1 kg)   BMI 40.69 kg/m   Specialty Comments:  No specialty comments available.  PMFS History: Patient Active Problem List   Diagnosis Date Noted  . Unilateral primary  osteoarthritis, left hip 06/06/2019  . Status post total replacement of left hip 06/06/2019  . S/P laparoscopic sleeve gastrectomy 03/03/2019  . Rash 01/24/2019  . Fever 08/15/2018  . UTI (urinary tract infection) 08/15/2018  . Atypical chest pain 08/15/2018  . Fall at home, initial encounter 08/15/2018  . Chronic anemia 08/15/2018  . Hypokalemia 08/15/2018  . Bowel incontinence 07/25/2018  . Other spondylosis with radiculopathy, lumbar region 07/16/2018    Class: Chronic  . Status post lumbar laminectomy 07/16/2018  . Status post THR (total hip replacement) 04/17/2018  . Unilateral primary osteoarthritis, right hip   . Morbid obesity with BMI of 40.0-44.9, adult ([place]) 02/28/2018  . Myocardial infarction ([place]) 02/25/2018  . Coronary artery disease involving native coronary artery of native heart 02/25/2018  . PAD (peripheral artery disease) ([place]) 02/25/2018  . S/P BKA (below knee amputation) unilateral, right ([place]) 02/25/2018  . Sacroiliitis ([place]) 02/25/2018  . SOB (shortness of breath) 01/03/2018  . Acute on chronic heart failure ([place]) 01/03/2018  . Severe right groin pain 12/20/2017  . Morbid obesity ([place]) 10/09/2017  . Exposure to sexually transmitted disease (STD) 07/14/2017  . Low back pain 07/13/2017  . Leukoplakia, tongue 01/26/2017  . Acute sinus infection 10/20/2016  . Hyperglycemia 10/20/2016  . Skin lesion 10/20/2016  . S/P unilateral BKA (below knee amputation), right ([place]) 06/14/2016  . Charcot foot due to diabetes mellitus ([place])   . Charcot's arthropathy associated with type 2 diabetes mellitus ([place]) 04/11/2016  . Preventative health care 11/05/2015  . Major depression 09/13/2015  . S/P TKR (total knee replacement) bilaterally 09/13/2015  . GERD (gastroesophageal reflux disease) 09/08/2015  . S/P laparoscopic hernia repair 05/11/2015  . PPD positive 04/08/2015  . Benign neoplasm of descending colon   . Benign neoplasm of cecum   . Acute blood loss anemia   .  Chronic anticoagulation   . Occult blood positive stool 10/17/2014  . General weakness 07/14/2014  . Urinary incontinence 07/14/2014  . Headache(784.0) 10/15/2013  . Spinal stenosis in cervical region 09/26/2013    Class: Chronic  . Spinal stenosis of lumbar region 09/26/2013    Class: Chronic  . Hand joint pain 06/10/2013  . Rotator cuff tear arthropathy of both shoulders 06/10/2013  . Skin lesion of cheek 05/01/2013  . Pain of right thumb 04/03/2013  . Balance disorder 03/12/2013  . Gait disorder 03/12/2013  . Tremor 03/12/2013  . Left hip pain 03/12/2013  . Pre-ulcerative corn or callous 02/06/2013  . Anxiety 11/12/2011  . OSA on CPAP 11/07/2011  . Bradycardia 10/20/2011  . Insomnia 10/04/2011  . Obesity 01/12/2011  . Diabetes mellitus type 2 in obese ([place]) 09/27/2010  . ERECTILE DYSFUNCTION, ORGANIC 05/30/2010  . [place] DISEASE, LUMBAR 04/19/2010  .  SCIATICA, LEFT 04/19/2010  . Chronic pain syndrome 10/27/2009  . Hyperlipidemia 07/15/2009  . Essential hypertension 06/24/2009  . Coronary artery disease involving native coronary artery of native heart without angina pectoris 06/24/2009  . Allergic rhinitis 06/24/2009  . URETHRAL STRICTURE 06/24/2009  . DEGENERATIVE JOINT DISEASE 06/24/2009  . SHOULDER PAIN, BILATERAL 06/24/2009  . FATIGUE 06/24/2009  . NEPHROLITHIASIS, HX OF 06/24/2009   Past Medical History:  Diagnosis Date  . ALLERGIC RHINITIS 06/24/2009  . Allergy   . Anxiety 11/12/2011   Adequate for discharge   . Arthritis    "all my joints" (09/30/2013)  . Arthritis of foot, right, degenerative 04/15/2014  . Balance disorder 03/12/2013  . Benign neoplasm of cecum   . Benign neoplasm of descending colon   . CAD (coronary artery disease) 06/24/2009   5 stents placed in 2007    . Chronic anticoagulation   . Chronic pain syndrome 10/27/2009   of ankle, shoulders, low back.  sciatica.   . Closed fracture of right foot 10/17/2014  . CORONARY ARTERY DISEASE 06/24/2009    a. s/p multiple PCIs - In 2008 he had a Taxus DES to the mild LAD, Endeavor DES to mid LCX and distal LCX. In January 2009 he had DES to distal LCX, mid LCX and proximal LCX. In November 2009 had BMS x 2 to the mid RCA. Cath 10/2011 with patent stents, noncardiac CP. LHC 01/2013: patent stents (noncardiac CP).  . DEGENERATIVE JOINT DISEASE 06/24/2009   Qualifier: Diagnosis of  By: [name], [name]   . Depression   . Depression with anxiety    Prior suicide attempt  . [place] DISEASE, LUMBAR 04/19/2010  . ERECTILE DYSFUNCTION, ORGANIC 05/30/2010  . Essential hypertension 06/24/2009   Qualifier: Diagnosis of  By: [name], [name]   . Fibromyalgia   . Fracture dislocation of ankle joint 09/02/2015  . Gait disorder 03/12/2013  . General weakness 07/14/2014  . GERD (gastroesophageal reflux disease) 09/08/2015  . Hand joint pain 06/10/2013  . Heart murmur   . Hepatitis C    treated pt. unknown with what he was a teenager  . History of kidney stones   . Hyperlipidemia 07/15/2009   Qualifier: Diagnosis of  By: Aundra Dubin, MD, [place]    . HYPERLIPIDEMIA-MIXED 07/15/2009  . HYPERTENSION 06/24/2009  . Insomnia 10/04/2011  . Irregular heart beat   . Left hip pain 03/12/2013   Injected under ultrasound guidance on June 24, 2013   . Major depression 09/13/2015  . Myocardial infarction ([place]) 2008  . Non-cardiac chest pain 10/2011, 01/2013  . Obesity   . Occult blood positive stool 10/17/2014  . Open ankle fracture 09/02/2015  . OSA (obstructive sleep apnea)    not using CPAP (09/30/2013)  . Pain of right thumb 04/03/2013  . Pneumonia   . PPD positive 04/08/2015  . Pre-ulcerative corn or callous 02/06/2013  . Rotator cuff tear arthropathy of both shoulders 06/10/2013   History of bilateral shoulder cuff surgery for rotator cuff tears. Reports increase in pain 09/11/2015 during physical therapy of the left shoulder.   [name] SCIATICA, LEFT 04/19/2010   Qualifier: Diagnosis of  By: [name], [name]   . Sleep apnea    wears  cpap   . Spinal stenosis in cervical region 09/26/2013  . Spinal stenosis, lumbar region, with neurogenic claudication 09/26/2013  . Type II diabetes mellitus ([place]) 2012   no meds in 09/2014.   [name] Uncontrolled type 2 DM with peripheral circulatory disorder (  [place]) 10/04/2013  . URETHRAL STRICTURE 06/24/2009   self catheterizes.     Family History  Problem Relation Age of Onset  . Depression Mother   . Heart disease Mother   . Hypertension Mother   . Breast cancer Mother   . Cancer Mother   . Diabetes Father   . Heart disease Father        CABG  . Hypertension Father   . Hyperlipidemia Father   . Prostate cancer Father   . Skin cancer Father   . Depression Brother        x 2  . Hypertension Brother        x2  . Healthy Son   . Heart disease Maternal Grandfather   . Early death Maternal Grandfather   . Heart attack Maternal Grandfather 07-31-2063  . Early death Paternal Grandfather   . Coronary artery disease Other   . Hypertension Other   . Depression Other   . Healthy Son   . Colon cancer Neg Hx   . Colon polyps Neg Hx   . Esophageal cancer Neg Hx   . Rectal cancer Neg Hx   . Stomach cancer Neg Hx     Past Surgical History:  Procedure Laterality Date  . AMPUTATION Right 06/14/2016   Procedure: AMPUTATION BELOW KNEE;  Surgeon: Newt Minion, MD;  Location: [facility];  Service: Orthopedics;  Laterality: Right;  . ANKLE FUSION Right 04/15/2014   Procedure: Right Subtalar, Talonavicular Fusion;  Surgeon: Newt Minion, MD;  Location: [facility];  Service: Orthopedics;  Laterality: Right;  . ANKLE FUSION Right 04/18/2016   Procedure: Right Ankle Tibiocalcaneal Fusion;  Surgeon: Newt Minion, MD;  Location: [facility];  Service: Orthopedics;  Laterality: Right;  . ANTERIOR CERVICAL DECOMP/DISCECTOMY FUSION N/A 09/26/2013   Procedure: ANTERIOR CERVICAL DISCECTOMY FUSION C3-4, plate and screw fixation, allograft bone graft;  Surgeon: Jessy Oto, MD;  Location: [facility];  Service: Orthopedics;  Laterality:  N/A;  . BACK SURGERY    . BELOW KNEE LEG AMPUTATION Right 06/14/2016   right ankle and foot  . CARDIAC CATHETERIZATION  X 1  . CARPAL TUNNEL RELEASE Bilateral   . COLONOSCOPY    . COLONOSCOPY N/A 10/22/2014   Procedure: COLONOSCOPY;  Surgeon: Lafayette Dragon, MD;  Location[facility] ENDOSCOPY;  Service: Endoscopy;  Laterality: N/A;  . CORONARY ANGIOPLASTY WITH STENT PLACEMENT     "I have 9 stents"  . ESOPHAGOGASTRODUODENOSCOPY N/A 10/19/2014   Procedure: ESOPHAGOGASTRODUODENOSCOPY (EGD);  Surgeon: Jerene Bears, MD;  [facility];  Service: Endoscopy;  Laterality: N/A;  . FRACTURE SURGERY    . FUSION OF TALONAVICULAR JOINT Right 04/15/2014   dr duda  . HERNIA REPAIR     umbilical  . INGUINAL HERNIA REPAIR Right 05/11/2015   Procedure: LAPAROSCOPIC REPAIR RIGHT  INGUINAL HERNIA;  Surgeon: Greer Pickerel, MD;  Location: [facility];  Service: General;  Laterality: Right;  . INSERTION OF MESH Right 05/11/2015   Procedure: INSERTION OF MESH;  Surgeon: Greer Pickerel, MD;  Location: [place];  Service: General;  Laterality: Right;  . JOINT REPLACEMENT    . KNEE CARTILAGE SURGERY Right X 12   "~ 1/2 open; ~ 1/2 scopes"  . KNEE CARTILAGE SURGERY Left X 3   "3 scopes"  . LAPAROSCOPIC GASTRIC SLEEVE RESECTION N/A 03/03/2019   Procedure: LAPAROSCOPIC GASTRIC SLEEVE RESECTION, Upper Endo, ERAS Pathway;  Surgeon: Greer Pickerel, MD;  Location: [facility];  Service: General;  Laterality: N/A;  .  LEFT HEART CATHETERIZATION WITH CORONARY ANGIOGRAM N/A 02/10/2013   Procedure: LEFT HEART CATHETERIZATION WITH CORONARY ANGIOGRAM;  Surgeon: Burnell Blanks, MD;  Location[facility] C[facility] LAB;  Service: Cardiovascular;  Laterality: N/A;  . LUMBAR LAMINECTOMY N/A 07/16/2018   Procedure: LEFT L4-5 REDO PARTIAL LUMBAR HEMILAMINECTOMY WITH FORAMINOTOMY LEFT L4;  Surgeon: Jessy Oto, MD;  Location: [place];  Service: Orthopedics;  Laterality: N/A;  . LUMBAR LAMINECTOMY/DECOMPRESSION MICRODISCECTOMY N/A 01/27/2014   Procedure: CENTRAL  LUMBAR LAMINECTOMY L4-5 AND L3-4;  Surgeon: Jessy Oto, MD;  Location: [place];  Service: Orthopedics;  Laterality: N/A;  . ORIF ANKLE FRACTURE Right 09/02/2015   Procedure: OPEN REDUCTION INTERNAL FIXATION (ORIF) ANKLE FRACTURE;  Surgeon: Newt Minion, MD;  Location: [facility];  Service: Orthopedics;  Laterality: Right;  . PERIPHERALLY INSERTED CENTRAL CATHETER INSERTION  09/02/2015  . POLYPECTOMY    . SHOULDER ARTHROSCOPY W/ ROTATOR CUFF REPAIR Bilateral    "3 on the right; 1 on the left"  . SKIN SPLIT GRAFT Right 10/01/2015   Procedure: RIGHT ANKLE APPLY SKIN GRAFT SPLIT THICKNESS;  Surgeon: Newt Minion, MD;  Location: [facility];  Service: Orthopedics;  Laterality: Right;  . TONSILLECTOMY    . TOOTH EXTRACTION    . TOTAL HIP ARTHROPLASTY Right 04/17/2018  . TOTAL HIP ARTHROPLASTY Right 04/17/2018   Procedure: RIGHT TOTAL HIP ARTHROPLASTY;  Surgeon: Newt Minion, MD;  Location: [facility];  Service: Orthopedics;  Laterality: Right;  . TOTAL HIP ARTHROPLASTY Left 06/06/2019   Procedure: LEFT TOTAL HIP ARTHROPLASTY ANTERIOR APPROACH;  Surgeon: Mcarthur Rossetti, MD;  Location: [facility];  Service: Orthopedics;  Laterality: Left;  . TOTAL KNEE ARTHROPLASTY Bilateral 2008  . UMBILICAL HERNIA REPAIR     UHR  . UPPER GASTROINTESTINAL ENDOSCOPY    . URETHRAL DILATION  X 4  . VASECTOMY    . WISDOM TOOTH EXTRACTION     Social History   Occupational History  . Occupation: disabled since 2006 due to ortho. heart, psych    Employer: UNEMPLOYED  . Occupation: part time work as an Multimedia programmer, wrestling, and Holiday representative  Tobacco Use  . Smoking status: Former Smoker    Types: Cigars    Quit date: 08/28/2010    Years since quitting: 8.8  . Smokeless tobacco: Never Used  . Tobacco comment: 04/18/2016 "smoked 1 cigar/wk when I did smoke"  Substance and Sexual Activity  . Alcohol use: Not Currently    Alcohol/week: 0.0 standard drinks    Comment: rarely  . Drug use: No  .  Sexual activity: Not Currently

## 2019-07-17 DIAGNOSIS — G4700 Insomnia, unspecified: Secondary | ICD-10-CM | POA: Diagnosis not present

## 2019-07-17 DIAGNOSIS — I110 Hypertensive heart disease with heart failure: Secondary | ICD-10-CM | POA: Diagnosis not present

## 2019-07-17 DIAGNOSIS — E119 Type 2 diabetes mellitus without complications: Secondary | ICD-10-CM | POA: Diagnosis not present

## 2019-07-17 DIAGNOSIS — I2510 Atherosclerotic heart disease of native coronary artery without angina pectoris: Secondary | ICD-10-CM | POA: Diagnosis not present

## 2019-07-17 DIAGNOSIS — G4733 Obstructive sleep apnea (adult) (pediatric): Secondary | ICD-10-CM | POA: Diagnosis not present

## 2019-07-17 DIAGNOSIS — M797 Fibromyalgia: Secondary | ICD-10-CM | POA: Diagnosis not present

## 2019-07-17 DIAGNOSIS — I509 Heart failure, unspecified: Secondary | ICD-10-CM | POA: Diagnosis not present

## 2019-07-17 DIAGNOSIS — G894 Chronic pain syndrome: Secondary | ICD-10-CM | POA: Diagnosis not present

## 2019-07-17 DIAGNOSIS — Z471 Aftercare following joint replacement surgery: Secondary | ICD-10-CM | POA: Diagnosis not present

## 2019-07-18 DIAGNOSIS — I509 Heart failure, unspecified: Secondary | ICD-10-CM | POA: Diagnosis not present

## 2019-07-18 DIAGNOSIS — G4700 Insomnia, unspecified: Secondary | ICD-10-CM | POA: Diagnosis not present

## 2019-07-18 DIAGNOSIS — Z471 Aftercare following joint replacement surgery: Secondary | ICD-10-CM | POA: Diagnosis not present

## 2019-07-18 DIAGNOSIS — G4733 Obstructive sleep apnea (adult) (pediatric): Secondary | ICD-10-CM | POA: Diagnosis not present

## 2019-07-18 DIAGNOSIS — E119 Type 2 diabetes mellitus without complications: Secondary | ICD-10-CM | POA: Diagnosis not present

## 2019-07-18 DIAGNOSIS — I2510 Atherosclerotic heart disease of native coronary artery without angina pectoris: Secondary | ICD-10-CM | POA: Diagnosis not present

## 2019-07-18 DIAGNOSIS — I110 Hypertensive heart disease with heart failure: Secondary | ICD-10-CM | POA: Diagnosis not present

## 2019-07-18 DIAGNOSIS — G894 Chronic pain syndrome: Secondary | ICD-10-CM | POA: Diagnosis not present

## 2019-07-18 DIAGNOSIS — M797 Fibromyalgia: Secondary | ICD-10-CM | POA: Diagnosis not present

## 2019-07-21 DIAGNOSIS — I2510 Atherosclerotic heart disease of native coronary artery without angina pectoris: Secondary | ICD-10-CM | POA: Diagnosis not present

## 2019-07-21 DIAGNOSIS — M797 Fibromyalgia: Secondary | ICD-10-CM | POA: Diagnosis not present

## 2019-07-21 DIAGNOSIS — I509 Heart failure, unspecified: Secondary | ICD-10-CM | POA: Diagnosis not present

## 2019-07-21 DIAGNOSIS — Z471 Aftercare following joint replacement surgery: Secondary | ICD-10-CM | POA: Diagnosis not present

## 2019-07-21 DIAGNOSIS — G4733 Obstructive sleep apnea (adult) (pediatric): Secondary | ICD-10-CM | POA: Diagnosis not present

## 2019-07-21 DIAGNOSIS — G4700 Insomnia, unspecified: Secondary | ICD-10-CM | POA: Diagnosis not present

## 2019-07-21 DIAGNOSIS — I110 Hypertensive heart disease with heart failure: Secondary | ICD-10-CM | POA: Diagnosis not present

## 2019-07-21 DIAGNOSIS — G894 Chronic pain syndrome: Secondary | ICD-10-CM | POA: Diagnosis not present

## 2019-07-21 DIAGNOSIS — E119 Type 2 diabetes mellitus without complications: Secondary | ICD-10-CM | POA: Diagnosis not present

## 2019-07-22 DIAGNOSIS — R6889 Other general symptoms and signs: Secondary | ICD-10-CM | POA: Diagnosis not present

## 2019-07-23 ENCOUNTER — Encounter: Payer: Self-pay | Admitting: Physician Assistant

## 2019-07-23 ENCOUNTER — Other Ambulatory Visit: Payer: Self-pay

## 2019-07-23 ENCOUNTER — Ambulatory Visit (INDEPENDENT_AMBULATORY_CARE_PROVIDER_SITE_OTHER): Payer: Medicare HMO | Admitting: Physician Assistant

## 2019-07-23 DIAGNOSIS — R6889 Other general symptoms and signs: Secondary | ICD-10-CM | POA: Diagnosis not present

## 2019-07-23 DIAGNOSIS — Z96642 Presence of left artificial hip joint: Secondary | ICD-10-CM

## 2019-07-23 NOTE — Progress Notes (Signed)
HPI: Travis [name] returns today follow-up 6 weeks 5 days status post left total hip arthroplasty.  He is overall doing well.  States his range of motion is improving.  He is doing home exercises been discharged from home physical therapy.  Physical exam: Left hip incisions healing well.  Proximal portion of the incision is healed.  Good range of motion of the left hip without significant pain calf supple nontender.  Dorsiflexion plantarflexion left ankle intact.  Impression: Status post left total hip arthroplasty 06/06/2019  Plan: He will continue to wash the incision with antibacterial soap.  Scar tissue mobilization.  Follow-up with Dr. Ninfa Linden.

## 2019-07-24 ENCOUNTER — Other Ambulatory Visit: Payer: Self-pay | Admitting: Internal Medicine

## 2019-07-24 ENCOUNTER — Other Ambulatory Visit: Payer: Self-pay

## 2019-07-24 ENCOUNTER — Ambulatory Visit (INDEPENDENT_AMBULATORY_CARE_PROVIDER_SITE_OTHER): Payer: Medicare HMO | Admitting: Internal Medicine

## 2019-07-24 ENCOUNTER — Encounter: Payer: Self-pay | Admitting: Internal Medicine

## 2019-07-24 VITALS — BP 124/66 | HR 54 | Temp 97.9°F | Ht 72.0 in | Wt 303.0 lb

## 2019-07-24 DIAGNOSIS — N529 Male erectile dysfunction, unspecified: Secondary | ICD-10-CM | POA: Diagnosis not present

## 2019-07-24 DIAGNOSIS — E119 Type 2 diabetes mellitus without complications: Secondary | ICD-10-CM

## 2019-07-24 DIAGNOSIS — E669 Obesity, unspecified: Secondary | ICD-10-CM | POA: Diagnosis not present

## 2019-07-24 DIAGNOSIS — E538 Deficiency of other specified B group vitamins: Secondary | ICD-10-CM | POA: Diagnosis not present

## 2019-07-24 DIAGNOSIS — Z0000 Encounter for general adult medical examination without abnormal findings: Secondary | ICD-10-CM

## 2019-07-24 DIAGNOSIS — E1169 Type 2 diabetes mellitus with other specified complication: Secondary | ICD-10-CM

## 2019-07-24 DIAGNOSIS — E611 Iron deficiency: Secondary | ICD-10-CM | POA: Diagnosis not present

## 2019-07-24 DIAGNOSIS — D509 Iron deficiency anemia, unspecified: Secondary | ICD-10-CM

## 2019-07-24 DIAGNOSIS — Z202 Contact with and (suspected) exposure to infections with a predominantly sexual mode of transmission: Secondary | ICD-10-CM

## 2019-07-24 DIAGNOSIS — E559 Vitamin D deficiency, unspecified: Secondary | ICD-10-CM | POA: Diagnosis not present

## 2019-07-24 DIAGNOSIS — R6889 Other general symptoms and signs: Secondary | ICD-10-CM | POA: Diagnosis not present

## 2019-07-24 LAB — HEPATIC FUNCTION PANEL
ALT: 11 U/L (ref 0–53)
AST: 17 U/L (ref 0–37)
Albumin: 3.9 g/dL (ref 3.5–5.2)
Alkaline Phosphatase: 73 U/L (ref 39–117)
Bilirubin, Direct: 0.1 mg/dL (ref 0.0–0.3)
Total Bilirubin: 0.3 mg/dL (ref 0.2–1.2)
Total Protein: 6.5 g/dL (ref 6.0–8.3)

## 2019-07-24 LAB — CBC WITH DIFFERENTIAL/PLATELET
Basophils Absolute: 0.1 10*3/uL (ref 0.0–0.1)
Basophils Relative: 1.2 % (ref 0.0–3.0)
Eosinophils Absolute: 0.6 10*3/uL (ref 0.0–0.7)
Eosinophils Relative: 9.9 % — ABNORMAL HIGH (ref 0.0–5.0)
HCT: 32.5 % — ABNORMAL LOW (ref 39.0–52.0)
Hemoglobin: 10.5 g/dL — ABNORMAL LOW (ref 13.0–17.0)
Lymphocytes Relative: 27.1 % (ref 12.0–46.0)
Lymphs Abs: 1.5 10*3/uL (ref 0.7–4.0)
MCHC: 32.2 g/dL (ref 30.0–36.0)
MCV: 87 fl (ref 78.0–100.0)
Monocytes Absolute: 0.5 10*3/uL (ref 0.1–1.0)
Monocytes Relative: 8.6 % (ref 3.0–12.0)
Neutro Abs: 3 10*3/uL (ref 1.4–7.7)
Neutrophils Relative %: 53.2 % (ref 43.0–77.0)
Platelets: 293 10*3/uL (ref 150.0–400.0)
RBC: 3.74 Mil/uL — ABNORMAL LOW (ref 4.22–5.81)
RDW: 16.4 % — ABNORMAL HIGH (ref 11.5–15.5)
WBC: 5.6 10*3/uL (ref 4.0–10.5)

## 2019-07-24 LAB — BASIC METABOLIC PANEL
BUN: 25 mg/dL — ABNORMAL HIGH (ref 6–23)
CO2: 30 mEq/L (ref 19–32)
Calcium: 9 mg/dL (ref 8.4–10.5)
Chloride: 104 mEq/L (ref 96–112)
Creatinine, Ser: 1.04 mg/dL (ref 0.40–1.50)
GFR: 71.2 mL/min (ref >60.00)
Glucose, Bld: 115 mg/dL — ABNORMAL HIGH (ref 70–99)
Potassium: 3.9 mEq/L (ref 3.5–5.1)
Sodium: 141 mEq/L (ref 135–145)

## 2019-07-24 LAB — URINALYSIS, ROUTINE W REFLEX MICROSCOPIC
Bilirubin Urine: NEGATIVE
Hgb urine dipstick: NEGATIVE
Ketones, ur: NEGATIVE
Leukocytes,Ua: NEGATIVE
Nitrite: NEGATIVE
RBC / HPF: NONE SEEN (ref 0–?)
Specific Gravity, Urine: 1.025 (ref 1.000–1.030)
Total Protein, Urine: NEGATIVE
Urine Glucose: NEGATIVE
Urobilinogen, UA: 0.2 (ref 0.0–1.0)
pH: 5.5 (ref 5.0–8.0)

## 2019-07-24 LAB — LIPID PANEL
Cholesterol: 108 mg/dL (ref 0–200)
HDL: 40.9 mg/dL (ref >39.00)
LDL Cholesterol: 49 mg/dL (ref 0–99)
NonHDL: 67.18
Total CHOL/HDL Ratio: 3
Triglycerides: 92 mg/dL (ref 0.0–149.0)
VLDL: 18.4 mg/dL (ref 0.0–40.0)

## 2019-07-24 LAB — TSH: TSH: 1.58 u[IU]/mL (ref 0.35–4.50)

## 2019-07-24 LAB — HEMOGLOBIN A1C: Hgb A1c MFr Bld: 5.3 % (ref 4.6–6.5)

## 2019-07-24 LAB — PSA: PSA: 0.56 ng/mL (ref 0.10–4.00)

## 2019-07-24 LAB — VITAMIN D 25 HYDROXY (VIT D DEFICIENCY, FRACTURES): VITD: 24.67 ng/mL — ABNORMAL LOW (ref 30.00–100.00)

## 2019-07-24 LAB — IBC PANEL
Iron: 23 ug/dL — ABNORMAL LOW (ref 42–165)
Saturation Ratios: 7.3 % — ABNORMAL LOW (ref 20.0–50.0)
Transferrin: 224 mg/dL (ref 212.0–360.0)

## 2019-07-24 LAB — VITAMIN B12: Vitamin B-12: 580 pg/mL (ref 211–911)

## 2019-07-24 LAB — TESTOSTERONE: Testosterone: 258.98 ng/dL — ABNORMAL LOW (ref 300.00–890.00)

## 2019-07-24 LAB — MICROALBUMIN / CREATININE URINE RATIO
Creatinine,U: 147.1 mg/dL
Microalb Creat Ratio: 0.5 mg/g (ref 0.0–30.0)
Microalb, Ur: <0.7 mg/dL (ref 0.0–1.9)

## 2019-07-24 MED ORDER — VITAMIN D (ERGOCALCIFEROL) 1.25 MG (50000 UNIT) PO CAPS: 50000.0000 [IU] | ORAL_CAPSULE | ORAL | 0 refills | Status: DC

## 2019-07-24 MED ORDER — TESTOSTERONE 50 MG/5GM (1%) TD GEL: 5.0000 g | Freq: Every day | TRANSDERMAL | 1 refills | Status: DC

## 2019-07-24 MED ORDER — POLYSACCHARIDE IRON COMPLEX 150 MG PO CAPS: 150.0000 mg | ORAL_CAPSULE | Freq: Every day | ORAL | 1 refills | Status: DC

## 2019-07-24 NOTE — Progress Notes (Signed)
Subjective:    Patient ID: Travis [name]., male    DOB: 28-Feb-1951, 69 y.o.   MRN: [id number]  HPI  Here for wellness and f/u;  Overall doing ok;  Pt denies Chest pain, worsening SOB, DOE, wheezing, orthopnea, PND, worsening LE edema, palpitations, dizziness or syncope.  Pt denies neurological change such as new headache, facial or extremity weakness.  Pt denies polydipsia, polyuria, or low sugar symptoms. Pt states overall good compliance with treatment and medications, good tolerability, and has been trying to follow appropriate diet.  Pt denies worsening depressive symptoms, suicidal ideation or panic. No fever, night sweats, wt loss, loss of appetite, or other constitutional symptoms.  Pt states good ability with ADL's, has low fall risk, home safety reviewed and adequate, no other significant changes in hearing or vision, and only occasionally active with exercise. Needs to be on vaccine wait list Asks for STD testing and testosterone level, has new girlfriend Past Medical History:  Diagnosis Date  . ALLERGIC RHINITIS 06/24/2009  . Allergy   . Anxiety 11/12/2011   Adequate for discharge   . Arthritis    "all my joints" (09/30/2013)  . Arthritis of foot, right, degenerative 04/15/2014  . Balance disorder 03/12/2013  . Benign neoplasm of cecum   . Benign neoplasm of descending colon   . CAD (coronary artery disease) 06/24/2009   5 stents placed in 2007    . Chronic anticoagulation   . Chronic pain syndrome 10/27/2009   of ankle, shoulders, low back.  sciatica.   . Closed fracture of right foot 10/17/2014  . CORONARY ARTERY DISEASE 06/24/2009   a. s/p multiple PCIs - In 2008 he had a Taxus DES to the mild LAD, Endeavor DES to mid LCX and distal LCX. In January 2009 he had DES to distal LCX, mid LCX and proximal LCX. In November 2009 had BMS x 2 to the mid RCA. Cath 10/2011 with patent stents, noncardiac CP. LHC 01/2013: patent stents (noncardiac CP).  . DEGENERATIVE JOINT DISEASE 06/24/2009   Qualifier: Diagnosis of  By: [name], [name]   . Depression   . Depression with anxiety    Prior suicide attempt  . [place] DISEASE, LUMBAR 04/19/2010  . ERECTILE DYSFUNCTION, ORGANIC 05/30/2010  . Essential hypertension 06/24/2009   Qualifier: Diagnosis of  By: [name], [name]   . Fibromyalgia   . Fracture dislocation of ankle joint 09/02/2015  . Gait disorder 03/12/2013  . General weakness 07/14/2014  . GERD (gastroesophageal reflux disease) 09/08/2015  . Hand joint pain 06/10/2013  . Heart murmur   . Hepatitis C    treated pt. unknown with what he was a teenager  . History of kidney stones   . Hyperlipidemia 07/15/2009   Qualifier: Diagnosis of  By: Aundra Dubin, MD, [place]    . HYPERLIPIDEMIA-MIXED 07/15/2009  . HYPERTENSION 06/24/2009  . Insomnia 10/04/2011  . Irregular heart beat   . Left hip pain 03/12/2013   Injected under ultrasound guidance on June 24, 2013   . Major depression 09/13/2015  . Myocardial infarction ([place]) 2008  . Non-cardiac chest pain 10/2011, 01/2013  . Obesity   . Occult blood positive stool 10/17/2014  . Open ankle fracture 09/02/2015  . OSA (obstructive sleep apnea)    not using CPAP (09/30/2013)  . Pain of right thumb 04/03/2013  . Pneumonia   . PPD positive 04/08/2015  . Pre-ulcerative corn or callous 02/06/2013  . Rotator cuff tear arthropathy of both shoulders 06/10/2013  History of bilateral shoulder cuff surgery for rotator cuff tears. Reports increase in pain 09/11/2015 during physical therapy of the left shoulder.   [name] SCIATICA, LEFT 04/19/2010   Qualifier: Diagnosis of  By: [name], [name]   . Sleep apnea    wears cpap   . Spinal stenosis in cervical region 09/26/2013  . Spinal stenosis, lumbar region, with neurogenic claudication 09/26/2013  . Type II diabetes mellitus ([place]) 2012   no meds in 09/2014.   [name] Uncontrolled type 2 DM with peripheral circulatory disorder ([place]) 10/04/2013  . URETHRAL STRICTURE 06/24/2009   self catheterizes.    Past Surgical  History:  Procedure Laterality Date  . AMPUTATION Right 06/14/2016   Procedure: AMPUTATION BELOW KNEE;  Surgeon: Newt Minion, MD;  Location: [place];  Service: Orthopedics;  Laterality: Right;  . ANKLE FUSION Right 04/15/2014   Procedure: Right Subtalar, Talonavicular Fusion;  Surgeon: Newt Minion, MD;  Location: [place];  Service: Orthopedics;  Laterality: Right;  . ANKLE FUSION Right 04/18/2016   Procedure: Right Ankle Tibiocalcaneal Fusion;  Surgeon: Newt Minion, MD;  Location: [place];  Service: Orthopedics;  Laterality: Right;  . ANTERIOR CERVICAL DECOMP/DISCECTOMY FUSION N/A 09/26/2013   Procedure: ANTERIOR CERVICAL DISCECTOMY FUSION C3-4, plate and screw fixation, allograft bone graft;  Surgeon: Jessy Oto, MD;  Location: [place];  Service: Orthopedics;  Laterality: N/A;  . BACK SURGERY    . BELOW KNEE LEG AMPUTATION Right 06/14/2016   right ankle and foot  . CARDIAC CATHETERIZATION  X 1  . CARPAL TUNNEL RELEASE Bilateral   . COLONOSCOPY    . COLONOSCOPY N/A 10/22/2014   Procedure: COLONOSCOPY;  Surgeon: Lafayette Dragon, MD;  Location[facility] ENDOSCOPY;  Service: Endoscopy;  Laterality: N/A;  . CORONARY ANGIOPLASTY WITH STENT PLACEMENT     "I have 9 stents"  . ESOPHAGOGASTRODUODENOSCOPY N/A 10/19/2014   Procedure: ESOPHAGOGASTRODUODENOSCOPY (EGD);  Surgeon: Jerene Bears, MD;  Location: [facility] ENDOSCOPY;  Service: Endoscopy;  Laterality: N/A;  . FRACTURE SURGERY    . FUSION OF TALONAVICULAR JOINT Right 04/15/2014   dr duda  . HERNIA REPAIR     umbilical  . INGUINAL HERNIA REPAIR Right 05/11/2015   Procedure: LAPAROSCOPIC REPAIR RIGHT  INGUINAL HERNIA;  Surgeon: Greer Pickerel, MD;  Location: [place];  Service: General;  Laterality: Right;  . INSERTION OF MESH Right 05/11/2015   Procedure: INSERTION OF MESH;  Surgeon: Greer Pickerel, MD;  Location: [place];  Service: General;  Laterality: Right;  . JOINT REPLACEMENT    . KNEE CARTILAGE SURGERY Right X 12   "~ 1/2 open; ~ 1/2 scopes"  . KNEE CARTILAGE  SURGERY Left X 3   "3 scopes"  . LAPAROSCOPIC GASTRIC SLEEVE RESECTION N/A 03/03/2019   Procedure: LAPAROSCOPIC GASTRIC SLEEVE RESECTION, Upper Endo, ERAS Pathway;  Surgeon: Greer Pickerel, MD;  Location: [facility];  Service: General;  Laterality: N/A;  . LEFT HEART CATHETERIZATION WITH CORONARY ANGIOGRAM N/A 02/10/2013   Procedure: LEFT HEART CATHETERIZATION WITH CORONARY ANGIOGRAM;  Surgeon: Burnell Blanks, MD;  Location: [facility] CATH LAB;  Service: Cardiovascular;  Laterality: N/A;  . LUMBAR LAMINECTOMY N/A 07/16/2018   Procedure: LEFT L4-5 REDO PARTIAL LUMBAR HEMILAMINECTOMY WITH FORAMINOTOMY LEFT L4;  Surgeon: Jessy Oto, MD;  Location: [place];  Service: Orthopedics;  Laterality: N/A;  . LUMBAR LAMINECTOMY/DECOMPRESSION MICRODISCECTOMY N/A 01/27/2014   Procedure: CENTRAL LUMBAR LAMINECTOMY L4-5 AND L3-4;  Surgeon: Jessy Oto, MD;  Location: [place];  Service: Orthopedics;  Laterality: N/A;  . ORIF ANKLE FRACTURE Right 09/02/2015   Procedure: OPEN REDUCTION INTERNAL FIXATION (ORIF) ANKLE FRACTURE;  Surgeon: Newt Minion, MD;  Location: [place];  Service: Orthopedics;  Laterality: Right;  . PERIPHERALLY INSERTED CENTRAL CATHETER INSERTION  09/02/2015  . POLYPECTOMY    . SHOULDER ARTHROSCOPY W/ ROTATOR CUFF REPAIR Bilateral    "3 on the right; 1 on the left"  . SKIN SPLIT GRAFT Right 10/01/2015   Procedure: RIGHT ANKLE APPLY SKIN GRAFT SPLIT THICKNESS;  Surgeon: Newt Minion, MD;  Location: [place];  Service: Orthopedics;  Laterality: Right;  . TONSILLECTOMY    . TOOTH EXTRACTION    . TOTAL HIP ARTHROPLASTY Right 04/17/2018  . TOTAL HIP ARTHROPLASTY Right 04/17/2018   Procedure: RIGHT TOTAL HIP ARTHROPLASTY;  Surgeon: Newt Minion, MD;  Location: [place];  Service: Orthopedics;  Laterality: Right;  . TOTAL HIP ARTHROPLASTY Left 06/06/2019   Procedure: LEFT TOTAL HIP ARTHROPLASTY ANTERIOR APPROACH;  Surgeon: Mcarthur Rossetti, MD;  Location: [place];  Service: Orthopedics;  Laterality: Left;    . TOTAL KNEE ARTHROPLASTY Bilateral 2008  . UMBILICAL HERNIA REPAIR     UHR  . UPPER GASTROINTESTINAL ENDOSCOPY    . URETHRAL DILATION  X 4  . VASECTOMY    . WISDOM TOOTH EXTRACTION      reports that he quit smoking about 8 years ago. His smoking use included cigars. He has never used smokeless tobacco. He reports previous alcohol use. He reports that he does not use drugs. family history includes Breast cancer in his mother; Cancer in his mother; Coronary artery disease in an other family member; Depression in his brother, mother, and another family member; Diabetes in his father; Early death in his maternal grandfather and paternal grandfather; Healthy in his son and son; Heart attack (age of onset: 90) in his maternal grandfather; Heart disease in his father, maternal grandfather, and mother; Hyperlipidemia in his father; Hypertension in his brother, father, mother, and another family member; Prostate cancer in his father; Skin cancer in his father. No Known Allergies Current Outpatient Medications on File Prior to Visit  Medication Sig Dispense Refill  . ARIPiprazole (ABILIFY) 5 MG tablet Take 1 tablet (5 mg total) by mouth at bedtime. 90 tablet 0  . aspirin 81 MG chewable tablet Chew 1 tablet (81 mg total) by mouth 2 (two) times daily. 35 tablet 0  . atorvastatin (LIPITOR) 20 MG tablet Take 20 mg by mouth daily.    . benztropine (COGENTIN) 0.5 MG tablet Take 1 tablet (0.5 mg total) by mouth at bedtime. 90 tablet 0  . calcium carbonate (TUMS - DOSED IN MG ELEMENTAL CALCIUM) 500 MG chewable tablet Chew 1,000 mg by mouth at bedtime.    . Calcium Polycarbophil (FIBER-CAPS PO) Take 3 capsules by mouth at bedtime.    . celecoxib (CELEBREX) 200 MG capsule Take 200 mg by mouth 2 (two) times daily.    . clonazePAM (KLONOPIN) 0.5 MG tablet Take 1 tablet (0.5 mg total) by mouth at bedtime. 90 tablet 1  . cyclobenzaprine (FLEXERIL) 5 MG tablet Take 5 mg by mouth at bedtime.    . diclofenac sodium  (VOLTAREN) 1 % GEL Apply 4 g topically 3 (three) times daily as needed (pain). 350 g 5  . DULoxetine (CYMBALTA) 30 MG capsule Take 1 capsule (30 mg total) by mouth 2 (two) times daily. 180 capsule 0  . gabapentin (NEURONTIN) 800 MG tablet Take 800 mg by mouth 3 (three) times  daily.     . hydrochlorothiazide (HYDRODIURIL) 25 MG tablet Take 1 tablet (25 mg total) by mouth daily. 90 tablet 3  . Melatonin 5 MG TABS Take 20 mg by mouth at bedtime.     . methocarbamol (ROBAXIN) 500 MG tablet Take 1 tablet (500 mg total) by mouth every 8 (eight) hours as needed for muscle spasms. 90 tablet 1  . Multiple Vitamins-Minerals (BARIATRIC MULTIVITAMINS/IRON PO) Take 1 tablet by mouth daily.    . mupirocin ointment (BACTROBAN) 2 % Place 1 application into the nose 2 (two) times daily. 22 g 0  . MYRBETRIQ 50 MG TB24 tablet Take 50 mg by mouth at bedtime.     . Omega-3 Fatty Acids (FISH OIL) 1000 MG CAPS Take 1,000 mg by mouth at bedtime.     [name] oxybutynin (DITROPAN-XL) 5 MG 24 hr tablet Take 5 mg by mouth at bedtime.    [name] oxyCODONE (OXY IR/ROXICODONE) 5 MG immediate release tablet Take 1-2 tablets (5-10 mg total) by mouth every 4 (four) hours as needed for moderate pain (pain score 4-6). 30 tablet 0  . oxymetazoline (AFRIN) 0.05 % nasal spray Place 1 spray into both nostrils 2 (two) times daily as needed for congestion.    . Pancrelipase, Lip-Prot-Amyl, (ZENPEP) 15000-47000 units CPEP Take 2 capsules with meals and 1 capsule with snacks    . pantoprazole (PROTONIX) 40 MG tablet TAKE ONE TABLET BY MOUTH EVERY DAY 90 tablet 1  . prasugrel (EFFIENT) 5 MG TABS tablet Take 5 mg by mouth at bedtime.    . sildenafil (VIAGRA) 100 MG tablet Take 100 mg by mouth daily as needed for erectile dysfunction.     . traZODone (DESYREL) 150 MG tablet Take 1 tablet (150 mg total) by mouth at bedtime. 90 tablet 0   No current facility-administered medications on file prior to visit.   Review of Systems All otherwise neg per pt      Objective:   Physical Exam BP 124/66   Pulse (!) 54   Temp 97.9 F (36.6 C)   Ht 6' (1.829 m)   Wt (!) 303 lb (137.4 kg)   SpO2 98%   BMI 41.09 kg/m  VS noted,  Constitutional: Pt appears in NAD HENT: Head: NCAT.  Right Ear: External ear normal.  Left Ear: External ear normal.  Eyes: . Pupils are equal, round, and reactive to light. Conjunctivae and EOM are normal Nose: without d/c or deformity Neck: Neck supple. Gross normal ROM Cardiovascular: Normal rate and regular rhythm.   Pulmonary/Chest: Effort normal and breath sounds without rales or wheezing.  Abd:  Soft, NT, ND, + BS, no organomegaly Neurological: Pt is alert. At baseline orientation, motor grossly intact Skin: Skin is warm. No rashes, other new lesions, no LE edema Psychiatric: Pt behavior is normal without agitation  All otherwise neg per pt Lab Results  Component Value Date   WBC 5.6 07/24/2019   HGB 10.5 (L) 07/24/2019   HCT 32.5 (L) 07/24/2019   PLT 293.0 07/24/2019   GLUCOSE 115 (H) 07/24/2019   CHOL 108 07/24/2019   TRIG 92.0 07/24/2019   HDL 40.90 07/24/2019   LDLCALC 49 07/24/2019   ALT 11 07/24/2019   AST 17 07/24/2019   NA 141 07/24/2019   K 3.9 07/24/2019   CL 104 07/24/2019   CREATININE 1.04 07/24/2019   BUN 25 (H) 07/24/2019   CO2 30 07/24/2019   TSH 1.58 07/24/2019   PSA 0.56 07/24/2019   INR 1.1 06/03/2019  HGBA1C 5.3 07/24/2019   MICROALBUR <0.7 07/24/2019      Assessment & Plan:

## 2019-07-24 NOTE — Patient Instructions (Signed)
You are on the [facility] Vaccine Wait list  Please continue all other medications as before, and refills have been done if requested.  Please have the pharmacy call with any other refills you may need.  Please continue your efforts at being more active, low cholesterol diet, and weight control.  You are otherwise up to date with prevention measures today.  Please keep your appointments with your specialists as you may have planned  Please go to the LAB at the blood drawing area for the tests to be done  You will be contacted by phone if any changes need to be made immediately.  Otherwise, you will receive a letter about your results with an explanation, but please check with MyChart first.  Please remember to sign up for MyChart if you have not done so, as this will be important to you in the future with finding out test results, communicating by private email, and scheduling acute appointments online when needed.  Please make an Appointment to return in 6 months, or sooner if needed

## 2019-07-25 DIAGNOSIS — R6889 Other general symptoms and signs: Secondary | ICD-10-CM | POA: Diagnosis not present

## 2019-07-25 LAB — RPR: RPR Ser Ql: NONREACTIVE

## 2019-07-25 LAB — HIV ANTIBODY (ROUTINE TESTING W REFLEX): HIV 1&2 Ab, 4th Generation: NONREACTIVE

## 2019-07-25 LAB — HSV 2 ANTIBODY, IGG: HSV 2 Glycoprotein G Ab, IgG: <0.9 index

## 2019-07-26 ENCOUNTER — Encounter: Payer: Self-pay | Admitting: Internal Medicine

## 2019-07-26 NOTE — Assessment & Plan Note (Signed)
stable overall by history and exam, recent data reviewed with pt, and pt to continue medical treatment as before,  to f/u any worsening symptoms or concerns  

## 2019-07-26 NOTE — Assessment & Plan Note (Signed)
For std testing

## 2019-07-26 NOTE — Assessment & Plan Note (Signed)
For testosterone level

## 2019-07-26 NOTE — Assessment & Plan Note (Signed)

## 2019-07-27 LAB — GC/CHLAMYDIA PROBE AMP
Chlamydia trachomatis, NAA: NEGATIVE
Neisseria Gonorrhoeae by PCR: NEGATIVE

## 2019-07-29 ENCOUNTER — Encounter: Payer: Self-pay | Admitting: Internal Medicine

## 2019-07-29 ENCOUNTER — Telehealth: Payer: Self-pay | Admitting: Internal Medicine

## 2019-07-29 ENCOUNTER — Ambulatory Visit (INDEPENDENT_AMBULATORY_CARE_PROVIDER_SITE_OTHER): Payer: Medicare HMO | Admitting: Internal Medicine

## 2019-07-29 DIAGNOSIS — R05 Cough: Secondary | ICD-10-CM | POA: Diagnosis not present

## 2019-07-29 DIAGNOSIS — R062 Wheezing: Secondary | ICD-10-CM | POA: Diagnosis not present

## 2019-07-29 DIAGNOSIS — R059 Cough, unspecified: Secondary | ICD-10-CM

## 2019-07-29 DIAGNOSIS — F5104 Psychophysiologic insomnia: Secondary | ICD-10-CM | POA: Diagnosis not present

## 2019-07-29 MED ORDER — ALBUTEROL SULFATE HFA 108 (90 BASE) MCG/ACT IN AERS: 2.0000 | INHALATION_SPRAY | Freq: Four times a day (QID) | RESPIRATORY_TRACT | 1 refills | Status: DC | PRN

## 2019-07-29 MED ORDER — AZITHROMYCIN 250 MG PO TABS: ORAL_TABLET | ORAL | 1 refills | Status: DC

## 2019-07-29 MED ORDER — HYDROCODONE-HOMATROPINE 5-1.5 MG/5ML PO SYRP: 5.0000 mL | ORAL_SOLUTION | Freq: Four times a day (QID) | ORAL | 0 refills | Status: AC | PRN

## 2019-07-29 NOTE — Progress Notes (Signed)
Patient ID: Travis [name]., male   DOB: [birth year], 69 y.o.   MRN: [id number]  Virtual Visit via Video Note  I connected with Travis Novak Sr. on 07/29/19 at  9:40 AM EST by a video enabled telemedicine application and verified that I am speaking with the correct person using two identifiers.  Location: Patient: at home Provider: at office   I discussed the limitations of evaluation and management by telemedicine and the availability of in person appointments. The patient expressed understanding and agreed to proceed.  History of Present Illness: Here with acute onset mild to mod 2-3 days ST, HA, general weakness and malaise, with prod cough greenish sputum, but Pt denies chest pain, increased sob or doe, wheezing, orthopnea, PND, increased LE swelling, palpitations, dizziness or syncope, except for onset mild wheezing and sob since last PM.  Also having insomnia, but asked per pain management to d/c the klonopin.  Declines respiratory clinic referral' Past Medical History:  Diagnosis Date  . ALLERGIC RHINITIS 06/24/2009  . Allergy   . Anxiety 11/12/2011   Adequate for discharge   . Arthritis    "all my joints" (09/30/2013)  . Arthritis of foot, right, degenerative 04/15/2014  . Balance disorder 03/12/2013  . Benign neoplasm of cecum   . Benign neoplasm of descending colon   . CAD (coronary artery disease) 06/24/2009   5 stents placed in 2007    . Chronic anticoagulation   . Chronic pain syndrome 10/27/2009   of ankle, shoulders, low back.  sciatica.   . Closed fracture of right foot 10/17/2014  . CORONARY ARTERY DISEASE 06/24/2009   a. s/p multiple PCIs - In 2008 he had a Taxus DES to the mild LAD, Endeavor DES to mid LCX and distal LCX. In January 2009 he had DES to distal LCX, mid LCX and proximal LCX. In November 2009 had BMS x 2 to the mid RCA. Cath 10/2011 with patent stents, noncardiac CP. LHC 01/2013: patent stents (noncardiac CP).  . DEGENERATIVE JOINT DISEASE 06/24/2009    Qualifier: Diagnosis of  By: [name], [name]   . Depression   . Depression with anxiety    Prior suicide attempt  . [name], LUMBAR 04/19/2010  . ERECTILE DYSFUNCTION, ORGANIC 05/30/2010  . Essential hypertension 06/24/2009   Qualifier: Diagnosis of  By: [name], [name]   . Fibromyalgia   . Fracture dislocation of ankle joint 09/02/2015  . Gait disorder 03/12/2013  . General weakness 07/14/2014  . GERD (gastroesophageal reflux disease) 09/08/2015  . Hand joint pain 06/10/2013  . Heart murmur   . Hepatitis C    treated pt. unknown with what he was a teenager  . History of kidney stones   . Hyperlipidemia 07/15/2009   Qualifier: Diagnosis of  By: Aundra Dubin, MD, [place]    . HYPERLIPIDEMIA-MIXED 07/15/2009  . HYPERTENSION 06/24/2009  . Insomnia 10/04/2011  . Irregular heart beat   . Left hip pain 03/12/2013   Injected under ultrasound guidance on June 24, 2013   . Major depression 09/13/2015  . Myocardial infarction ([place]) 2008  . Non-cardiac chest pain 10/2011, 01/2013  . Obesity   . Occult blood positive stool 10/17/2014  . Open ankle fracture 09/02/2015  . OSA (obstructive sleep apnea)    not using CPAP (09/30/2013)  . Pain of right thumb 04/03/2013  . Pneumonia   . PPD positive 04/08/2015  . Pre-ulcerative corn or callous 02/06/2013  . Rotator cuff tear arthropathy of both shoulders 06/10/2013  History of bilateral shoulder cuff surgery for rotator cuff tears. Reports increase in pain 09/11/2015 during physical therapy of the left shoulder.   [name] SCIATICA, LEFT 04/19/2010   Qualifier: Diagnosis of  By: [name], [name]   . Sleep apnea    wears cpap   . Spinal stenosis in cervical region 09/26/2013  . Spinal stenosis, lumbar region, with neurogenic claudication 09/26/2013  . Type II diabetes mellitus ([place]) 2012   no meds in 09/2014.   [name] Uncontrolled type 2 DM with peripheral circulatory disorder ([place]) 10/04/2013  . URETHRAL STRICTURE 06/24/2009   self catheterizes.    Past Surgical  History:  Procedure Laterality Date  . AMPUTATION Right 06/14/2016   Procedure: AMPUTATION BELOW KNEE;  Surgeon: Newt Minion, MD;  Location: [place];  Service: Orthopedics;  Laterality: Right;  . ANKLE FUSION Right 04/15/2014   Procedure: Right Subtalar, Talonavicular Fusion;  Surgeon: Newt Minion, MD;  Location: [place];  Service: Orthopedics;  Laterality: Right;  . ANKLE FUSION Right 04/18/2016   Procedure: Right Ankle Tibiocalcaneal Fusion;  Surgeon: Newt Minion, MD;  Location: [place];  Service: Orthopedics;  Laterality: Right;  . ANTERIOR CERVICAL DECOMP/DISCECTOMY FUSION N/A 09/26/2013   Procedure: ANTERIOR CERVICAL DISCECTOMY FUSION C3-4, plate and screw fixation, allograft bone graft;  Surgeon: Jessy Oto, MD;  Location: [place];  Service: Orthopedics;  Laterality: N/A;  . BACK SURGERY    . BELOW KNEE LEG AMPUTATION Right 06/14/2016   right ankle and foot  . CARDIAC CATHETERIZATION  X 1  . CARPAL TUNNEL RELEASE Bilateral   . COLONOSCOPY    . COLONOSCOPY N/A 10/22/2014   Procedure: COLONOSCOPY;  Surgeon: Lafayette Dragon, MD;  [facility] ENDOSCOPY;  Service: Endoscopy;  Laterality: N/A;  . CORONARY ANGIOPLASTY WITH STENT PLACEMENT     "I have 9 stents"  . ESOPHAGOGASTRODUODENOSCOPY N/A 10/19/2014   Procedure: ESOPHAGOGASTRODUODENOSCOPY (EGD);  Surgeon: Jerene Bears, MD;  Location: [facility] ENDOSCOPY;  Service: Endoscopy;  Laterality: N/A;  . FRACTURE SURGERY    . FUSION OF TALONAVICULAR JOINT Right 04/15/2014   dr duda  . HERNIA REPAIR     umbilical  . INGUINAL HERNIA REPAIR Right 05/11/2015   Procedure: LAPAROSCOPIC REPAIR RIGHT  INGUINAL HERNIA;  Surgeon: Greer Pickerel, MD;  Location: [place];  Service: General;  Laterality: Right;  . INSERTION OF MESH Right 05/11/2015   Procedure: INSERTION OF MESH;  Surgeon: Greer Pickerel, MD;  Location: [place];  Service: General;  Laterality: Right;  . JOINT REPLACEMENT    . KNEE CARTILAGE SURGERY Right X 12   "~ 1/2 open; ~ 1/2 scopes"  . KNEE CARTILAGE  SURGERY Left X 3   "3 scopes"  . LAPAROSCOPIC GASTRIC SLEEVE RESECTION N/A 03/03/2019   Procedure: LAPAROSCOPIC GASTRIC SLEEVE RESECTION, Upper Endo, ERAS Pathway;  Surgeon: Greer Pickerel, MD;  Location: [facility];  Service: General;  Laterality: N/A;  . LEFT HEART CATHETERIZATION WITH CORONARY ANGIOGRAM N/A 02/10/2013   Procedure: LEFT HEART CATHETERIZATION WITH CORONARY ANGIOGRAM;  Surgeon: Burnell Blanks, MD;  Location: [facility] CATH LAB;  Service: Cardiovascular;  Laterality: N/A;  . LUMBAR LAMINECTOMY N/A 07/16/2018   Procedure: LEFT L4-5 REDO PARTIAL LUMBAR HEMILAMINECTOMY WITH FORAMINOTOMY LEFT L4;  Surgeon: Jessy Oto, MD;  Location: [place];  Service: Orthopedics;  Laterality: N/A;  . LUMBAR LAMINECTOMY/DECOMPRESSION MICRODISCECTOMY N/A 01/27/2014   Procedure: CENTRAL LUMBAR LAMINECTOMY L4-5 AND L3-4;  Surgeon: Jessy Oto, MD;  Location: [place];  Service: Orthopedics;  Laterality: N/A;  . ORIF ANKLE FRACTURE Right 09/02/2015   Procedure: OPEN REDUCTION INTERNAL FIXATION (ORIF) ANKLE FRACTURE;  Surgeon: Newt Minion, MD;  Location: [place];  Service: Orthopedics;  Laterality: Right;  . PERIPHERALLY INSERTED CENTRAL CATHETER INSERTION  09/02/2015  . POLYPECTOMY    . SHOULDER ARTHROSCOPY W/ ROTATOR CUFF REPAIR Bilateral    "3 on the right; 1 on the left"  . SKIN SPLIT GRAFT Right 10/01/2015   Procedure: RIGHT ANKLE APPLY SKIN GRAFT SPLIT THICKNESS;  Surgeon: Newt Minion, MD;  Location: [place];  Service: Orthopedics;  Laterality: Right;  . TONSILLECTOMY    . TOOTH EXTRACTION    . TOTAL HIP ARTHROPLASTY Right 04/17/2018  . TOTAL HIP ARTHROPLASTY Right 04/17/2018   Procedure: RIGHT TOTAL HIP ARTHROPLASTY;  Surgeon: Newt Minion, MD;  Location: [place];  Service: Orthopedics;  Laterality: Right;  . TOTAL HIP ARTHROPLASTY Left 06/06/2019   Procedure: LEFT TOTAL HIP ARTHROPLASTY ANTERIOR APPROACH;  Surgeon: Mcarthur Rossetti, MD;  Location: [place];  Service: Orthopedics;  Laterality: Left;   . TOTAL KNEE ARTHROPLASTY Bilateral 2008  . UMBILICAL HERNIA REPAIR     UHR  . UPPER GASTROINTESTINAL ENDOSCOPY    . URETHRAL DILATION  X 4  . VASECTOMY    . WISDOM TOOTH EXTRACTION      reports that he quit smoking about 8 years ago. His smoking use included cigars. He has never used smokeless tobacco. He reports previous alcohol use. He reports that he does not use drugs. family history includes Breast cancer in his mother; Cancer in his mother; Coronary artery disease in an other family member; Depression in his brother, mother, and another family member; Diabetes in his father; Early death in his maternal grandfather and paternal grandfather; Healthy in his son and son; Heart attack (age of onset: 11) in his maternal grandfather; Heart disease in his father, maternal grandfather, and mother; Hyperlipidemia in his father; Hypertension in his brother, father, mother, and another family member; Prostate cancer in his father; Skin cancer in his father. No Known Allergies Current Outpatient Medications on File Prior to Visit  Medication Sig Dispense Refill  . ARIPiprazole (ABILIFY) 5 MG tablet Take 1 tablet (5 mg total) by mouth at bedtime. 90 tablet 0  . aspirin 81 MG chewable tablet Chew 1 tablet (81 mg total) by mouth 2 (two) times daily. 35 tablet 0  . atorvastatin (LIPITOR) 20 MG tablet Take 20 mg by mouth daily.    . benztropine (COGENTIN) 0.5 MG tablet Take 1 tablet (0.5 mg total) by mouth at bedtime. 90 tablet 0  . calcium carbonate (TUMS - DOSED IN MG ELEMENTAL CALCIUM) 500 MG chewable tablet Chew 1,000 mg by mouth at bedtime.    . Calcium Polycarbophil (FIBER-CAPS PO) Take 3 capsules by mouth at bedtime.    . celecoxib (CELEBREX) 200 MG capsule Take 200 mg by mouth 2 (two) times daily.    . cyclobenzaprine (FLEXERIL) 5 MG tablet Take 5 mg by mouth at bedtime.    . diclofenac sodium (VOLTAREN) 1 % GEL Apply 4 g topically 3 (three) times daily as needed (pain). 350 g 5  . DULoxetine  (CYMBALTA) 30 MG capsule Take 1 capsule (30 mg total) by mouth 2 (two) times daily. 180 capsule 0  . gabapentin (NEURONTIN) 800 MG tablet Take 800 mg by mouth 3 (three) times daily.     . hydrochlorothiazide (HYDRODIURIL) 25 MG tablet Take 1 tablet (25 mg total) by mouth daily. [place]  tablet 3  . iron polysaccharides (NU-IRON) 150 MG capsule Take 1 capsule (150 mg total) by mouth daily. 90 capsule 1  . Melatonin 5 MG TABS Take 20 mg by mouth at bedtime.     . methocarbamol (ROBAXIN) 500 MG tablet Take 1 tablet (500 mg total) by mouth every 8 (eight) hours as needed for muscle spasms. 90 tablet 1  . Multiple Vitamins-Minerals (BARIATRIC MULTIVITAMINS/IRON PO) Take 1 tablet by mouth daily.    . mupirocin ointment (BACTROBAN) 2 % Place 1 application into the nose 2 (two) times daily. 22 g 0  . MYRBETRIQ 50 MG TB24 tablet Take 50 mg by mouth at bedtime.     . Omega-3 Fatty Acids (FISH OIL) 1000 MG CAPS Take 1,000 mg by mouth at bedtime.     [name] oxybutynin (DITROPAN-XL) 5 MG 24 hr tablet Take 5 mg by mouth at bedtime.    [name] oxyCODONE (OXY IR/ROXICODONE) 5 MG immediate release tablet Take 1-2 tablets (5-10 mg total) by mouth every 4 (four) hours as needed for moderate pain (pain score 4-6). 30 tablet 0  . oxymetazoline (AFRIN) 0.05 % nasal spray Place 1 spray into both nostrils 2 (two) times daily as needed for congestion.    . Pancrelipase, Lip-Prot-Amyl, (ZENPEP) 15000-47000 units CPEP Take 2 capsules with meals and 1 capsule with snacks    . pantoprazole (PROTONIX) 40 MG tablet TAKE ONE TABLET BY MOUTH EVERY DAY 90 tablet 1  . prasugrel (EFFIENT) 5 MG TABS tablet Take 5 mg by mouth at bedtime.    . sildenafil (VIAGRA) 100 MG tablet Take 100 mg by mouth daily as needed for erectile dysfunction.     [name] testosterone (ANDROGEL) 50 MG/5GM (1%) GEL Place 5 g onto the skin daily. 450 g 1  . traZODone (DESYREL) 150 MG tablet Take 1 tablet (150 mg total) by mouth at bedtime. 90 tablet 0  . Vitamin D, Ergocalciferol,  (DRISDOL) 1.25 MG (50000 UNIT) CAPS capsule Take 1 capsule (50,000 Units total) by mouth every 7 (seven) days. 12 capsule 0   No current facility-administered medications on file prior to visit.    Observations/Objective: Alert, NAD, appropriate mood and affect, resps normal, cn 2-12 intact, moves all 4s, no visible rash or swelling Lab Results  Component Value Date   WBC 5.6 07/24/2019   HGB 10.5 (L) 07/24/2019   HCT 32.5 (L) 07/24/2019   PLT 293.0 07/24/2019   GLUCOSE 115 (H) 07/24/2019   CHOL 108 07/24/2019   TRIG 92.0 07/24/2019   HDL 40.90 07/24/2019   LDLCALC 49 07/24/2019   ALT 11 07/24/2019   AST 17 07/24/2019   NA 141 07/24/2019   K 3.9 07/24/2019   CL 104 07/24/2019   CREATININE 1.04 07/24/2019   BUN 25 (H) 07/24/2019   CO2 30 07/24/2019   TSH 1.58 07/24/2019   PSA 0.56 07/24/2019   INR 1.1 06/03/2019   HGBA1C 5.3 07/24/2019   MICROALBUR <0.7 07/24/2019   Assessment and Plan: See notes  Follow Up Instructions: See notes   I discussed the assessment and treatment plan with the patient. The patient was provided an opportunity to ask questions and all were answered. The patient agreed with the plan and demonstrated an understanding of the instructions.   The patient was advised to call back or seek an in-person evaluation if the symptoms worsen or if the condition fails to improve as anticipated.  [name], MD

## 2019-07-29 NOTE — Assessment & Plan Note (Signed)
Mild to mod, for predpac asd, inhaler prn, to f/u any worsening symptoms or concerns

## 2019-07-29 NOTE — Assessment & Plan Note (Addendum)
Mild to mod, c/w bronchitis vs pna, for antibx course,  to f/u any worsening symptoms or concerns  I spent 30 minutes in preparing to see the patient by review of recent labs, imaging and procedures, obtaining and reviewing separately obtained history, communicating with the patient and family or caregiver, ordering medications, tests or procedures, and documenting clinical information in the EHR including the differential Dx, treatment, and any further evaluation and other management of cough, wheezing insonnia

## 2019-07-29 NOTE — Telephone Encounter (Signed)
New message:   Pt is calling and states the insurance will not pay for testosterone (ANDROGEL) 50 MG/5GM (1%) GEL and he is needing an override approved by the doctor for insurance to pay for it. Please advise.

## 2019-07-29 NOTE — Patient Instructions (Signed)
Please take all new medication as prescribed 

## 2019-07-30 DIAGNOSIS — G4733 Obstructive sleep apnea (adult) (pediatric): Secondary | ICD-10-CM | POA: Diagnosis not present

## 2019-07-30 DIAGNOSIS — R6889 Other general symptoms and signs: Secondary | ICD-10-CM | POA: Diagnosis not present

## 2019-07-30 NOTE — Telephone Encounter (Signed)
F/u   The patient saying they have not denied the medication yet, the form is still incomplete, MD needs to answer questions.

## 2019-07-30 NOTE — Telephone Encounter (Signed)
Key: [id number]  PA came back that the testosterone rx is a not covered on patients formulary.   Pt contacted and informed of same. Informed patient that he could contact his insurance carrier and see what the alternatives that they do cover and let us know.

## 2019-07-31 DIAGNOSIS — N3941 Urge incontinence: Secondary | ICD-10-CM | POA: Diagnosis not present

## 2019-07-31 DIAGNOSIS — R6889 Other general symptoms and signs: Secondary | ICD-10-CM | POA: Diagnosis not present

## 2019-07-31 MED ORDER — TESTOSTERONE 1.62 % TD GEL: 1.0000 | Freq: Every day | TRANSDERMAL | 1 refills | Status: DC

## 2019-07-31 NOTE — Telephone Encounter (Signed)
[place] and they stated  That the testosterone 1% was non formulary. They gave 2 alternatives. Spoke to pt and informed of same. Pt stated that he did not want to do the injection.   That leave the alternative testosterone 1.62% Gel.  I have pended the alternative for your review. Please advise if you agree to the change.

## 2019-07-31 NOTE — Telephone Encounter (Signed)
Done erx 

## 2019-08-04 ENCOUNTER — Telehealth: Payer: Self-pay | Admitting: Internal Medicine

## 2019-08-04 NOTE — Telephone Encounter (Signed)
PCP changed to an alternative rx - testosterone 1.62% Gel.   PA still required. Key: [id number]

## 2019-08-04 NOTE — Telephone Encounter (Signed)
New message:   Pt is calling and states he needs prior authorization sent to [facility] for his testosterone gel. Please advise.

## 2019-08-05 ENCOUNTER — Ambulatory Visit (INDEPENDENT_AMBULATORY_CARE_PROVIDER_SITE_OTHER): Payer: Medicare HMO | Admitting: Nurse Practitioner

## 2019-08-05 ENCOUNTER — Encounter: Payer: Self-pay | Admitting: Nurse Practitioner

## 2019-08-05 VITALS — BP 126/68 | HR 65 | Temp 98.2°F | Ht 72.0 in | Wt 300.0 lb

## 2019-08-05 DIAGNOSIS — D508 Other iron deficiency anemias: Secondary | ICD-10-CM | POA: Diagnosis not present

## 2019-08-05 DIAGNOSIS — R1319 Other dysphagia: Secondary | ICD-10-CM | POA: Diagnosis not present

## 2019-08-05 DIAGNOSIS — R6889 Other general symptoms and signs: Secondary | ICD-10-CM | POA: Diagnosis not present

## 2019-08-05 NOTE — Progress Notes (Signed)
Agree with assessment and plan as outlined.  

## 2019-08-05 NOTE — Progress Notes (Signed)
08/05/2019 Travis [name] [id number] 1950-07-15   Chief Complaint:  Anemia   History of Present Illness: Travis [name]. is a 69 year old male with a past medical history of arthritis, depression, arthritis, hypertension, coronary artery disease with a total of 9 stents on Effienct, MI,  DM II, anemia since at least 2016, GERD and colon polyps. S/P gastric sleeve surgery 03/03/2019. S/P left hip replacement surgery 06/06/2019.  Weight 330lbs prior to surgery.  S/P right below knee amputation 05/2016. He presents today for further evaluation regarding iron deficiency anemia. Labs 07/24/2019 showed a Hg level 10.5 (Hg  13.8 on 06/03/2019). Iron 23. Vitamin B12 580. He was prescribed Nu-Iron 152m po daily but he could not afford the cost. He denies having any chest pain or SOB. No heartburn. He complains of dysphagia which occurs daily for the past 3 months. He describes having food getting stuck to his upper esophagus. He drinks water and the food passes down. No recent antibiotics or steroids. He is taking Pantoprazole 427mdaily. No upper or lower abdominal pain. He is passing a solid formed brown stool once weekly. No rectal bleeding or melena. He previously had watery diarrhea daily which resolved after he stated taking ZenPep 2 caps with each meal and 1 cap with snacks. He attempted to reduce Zenpep to 1 capsule with each meal and snack but his diarrhea promptly recurred. He would much rather have firm solid stools twice weekly than have watery diarrhea daily. An abd/pelvic CT scan 02/11/2019 showed evidence of an atrophic pancreas. History of CAD with 9 stents on Effient 34m13mHS and ASA 60m77mily. He reports taking Celebrex 200mg28mbid for the past year.   CBC Latest Ref Rng & Units 07/24/2019 06/09/2019 06/08/2019  WBC 4.0 - 10.5 K/uL 5.6 6.3 7.6  Hemoglobin 13.0 - 17.0 g/dL 10.5(L) 9.3(L) 9.7(L)  Hematocrit 39.0 - 52.0 % 32.5(L) 27.5(L) 29.3(L)  Platelets 150.0 - 400.0 K/uL 293.0  252 227   CMP Latest Ref Rng & Units 07/24/2019 06/07/2019 06/03/2019  Glucose 70 - 99 mg/dL 115(H) 165(H) 114(H)  BUN 6 - 23 mg/dL 25(H) 21 30(H)  Creatinine 0.40 - 1.50 mg/dL 1.04 1.15 0.94  Sodium 135 - 145 mEq/L 141 136 137  Potassium 3.5 - 5.1 mEq/L 3.9 3.4(L) 3.6  Chloride 96 - 112 mEq/L 104 101 103  CO2 19 - 32 mEq/L 30 26 24   Calcium 8.4 - 10.5 mg/dL 9.0 8.3(L) 9.1  Total Protein 6.0 - 8.3 g/dL 6.5 - -  Total Bilirubin 0.2 - 1.2 mg/dL 0.3 - -  Alkaline Phos 39 - 117 U/L 73 - -  AST 0 - 37 U/L 17 - -  ALT 0 - 53 U/L 11 - -   Iron/TIBC/Ferritin/ %Sat    Component Value Date/Time   IRON 23 (L) 07/24/2019 1447   TIBC 186 (L) 10/18/2014 0930   FERRITIN 147 10/18/2014 0930   IRONPCTSAT 7.3 (L) 07/24/2019 1447    Colonoscopy  11/19/2018 by Dr. ArmbrHavery Moroshe examined portion of the ileum was normal. - One 3 mm polyp in the cecum, removed with a cold snare. Resected and retrieved. - Two 3 mm polyps in the ascending colon, removed with a cold snare. Resected and retrieved. - Two 3 mm polyps in the transverse colon, removed with a cold snare. Resected and retrieved. - One 3 mm polyp in the sigmoid colon, removed with a cold snare. Resected and retrieved. - Medium-sized lipoma in  the ascending colon. - Internal hemorrhoids. - The examination was otherwise normal. - Biopsies were taken with a cold forceps from the right colon, left colon and transverse colon for evaluation of microscopic colitis. - Recall colonoscopy 3 years   1. Surgical [P], ascending, transverse, sigmoid and cecum, polyps (6) - TUBULAR ADENOMA (X5 FRAGMENTS). - SESSILE SERRATED POLYP WITHOUT DYSPLASIA. - NO HIGH GRADE DYSPLASIA OR MALIGNANCY. 2. Surgical [P], random colon sites - BENIGN COLONIC MUCOSA. - NO ACTIVE INFLAMMATION OR EVIDENCE OF MICROSCOPIC COLITIS. - NO DYSPLASIA OR MALIGNANCY.  EGD 10/19/2014 by Dr. Hilarie Fredrickson:  1. The mucosa of the esophagus appeared normal 2. Erosive gastritis  (inflammation) was found in the gastric antrum and prepyloric region stomach. No evidence of H. Pylori.  3. Food residue in the gastric fundus, suspect mild narcotic-related gastroparesis 4. The duodenal mucosa showed no abnormalities in the bulb and 2nd part of the duodenum    CBC Latest Ref Rng & Units 07/24/2019 06/09/2019 06/08/2019  WBC 4.0 - 10.5 K/uL 5.6 6.3 7.6  Hemoglobin 13.0 - 17.0 g/dL 10.5(L) 9.3(L) 9.7(L)  Hematocrit 39.0 - 52.0 % 32.5(L) 27.5(L) 29.3(L)  Platelets 150.0 - 400.0 K/uL 293.0 252 227   CMP Latest Ref Rng & Units 07/24/2019 06/07/2019 06/03/2019  Glucose 70 - 99 mg/dL 115(H) 165(H) 114(H)  BUN 6 - 23 mg/dL 25(H) 21 30(H)  Creatinine 0.40 - 1.50 mg/dL 1.04 1.15 0.94  Sodium 135 - 145 mEq/L 141 136 137  Potassium 3.5 - 5.1 mEq/L 3.9 3.4(L) 3.6  Chloride 96 - 112 mEq/L 104 101 103  CO2 19 - 32 mEq/L 30 26 24   Calcium 8.4 - 10.5 mg/dL 9.0 8.3(L) 9.1  Total Protein 6.0 - 8.3 g/dL 6.5 - -  Total Bilirubin 0.2 - 1.2 mg/dL 0.3 - -  Alkaline Phos 39 - 117 U/L 73 - -  AST 0 - 37 U/L 17 - -  ALT 0 - 53 U/L 11 - -    Past Medical History:  Diagnosis Date  . ALLERGIC RHINITIS 06/24/2009  . Allergy   . Anxiety 11/12/2011   Adequate for discharge   . Arthritis    "all my joints" (09/30/2013)  . Arthritis of foot, right, degenerative 04/15/2014  . Balance disorder 03/12/2013  . Benign neoplasm of cecum   . Benign neoplasm of descending colon   . CAD (coronary artery disease) 06/24/2009   5 stents placed in 2007    . Chronic anticoagulation   . Chronic pain syndrome 10/27/2009   of ankle, shoulders, low back.  sciatica.   . Closed fracture of right foot 10/17/2014  . CORONARY ARTERY DISEASE 06/24/2009   a. s/p multiple PCIs - In 2008 he had a Taxus DES to the mild LAD, Endeavor DES to mid LCX and distal LCX. In January 2009 he had DES to distal LCX, mid LCX and proximal LCX. In November 2009 had BMS x 2 to the mid RCA. Cath 10/2011 with patent stents, noncardiac CP. LHC 01/2013:  patent stents (noncardiac CP).  . DEGENERATIVE JOINT DISEASE 06/24/2009   Qualifier: Diagnosis of  By: [name], [name]   . Depression   . Depression with anxiety    Prior suicide attempt  . [place] DISEASE, LUMBAR 04/19/2010  . ERECTILE DYSFUNCTION, ORGANIC 05/30/2010  . Essential hypertension 06/24/2009   Qualifier: Diagnosis of  By: [name], [name]   . Fibromyalgia   . Fracture dislocation of ankle joint 09/02/2015  . Gait disorder 03/12/2013  . General weakness  07/14/2014  . GERD (gastroesophageal reflux disease) 09/08/2015  . Hand joint pain 06/10/2013  . Heart murmur   . Hepatitis C    treated pt. unknown with what he was a teenager  . History of kidney stones   . Hyperlipidemia 07/15/2009   Qualifier: Diagnosis of  By: Aundra Dubin, MD, [place]    . HYPERLIPIDEMIA-MIXED 07/15/2009  . HYPERTENSION 06/24/2009  . Insomnia 10/04/2011  . Irregular heart beat   . Left hip pain 03/12/2013   Injected under ultrasound guidance on June 24, 2013   . Major depression 09/13/2015  . Myocardial infarction ([place]) 2008  . Non-cardiac chest pain 10/2011, 01/2013  . Obesity   . Occult blood positive stool 10/17/2014  . Open ankle fracture 09/02/2015  . OSA (obstructive sleep apnea)    not using CPAP (09/30/2013)  . Pain of right thumb 04/03/2013  . Pneumonia   . PPD positive 04/08/2015  . Pre-ulcerative corn or callous 02/06/2013  . Rotator cuff tear arthropathy of both shoulders 06/10/2013   History of bilateral shoulder cuff surgery for rotator cuff tears. Reports increase in pain 09/11/2015 during physical therapy of the left shoulder.   [name] SCIATICA, LEFT 04/19/2010   Qualifier: Diagnosis of  By: [name], [name]   . Sleep apnea    wears cpap   . Spinal stenosis in cervical region 09/26/2013  . Spinal stenosis, lumbar region, with neurogenic claudication 09/26/2013  . Type II diabetes mellitus ([place]) 2012   no meds in 09/2014.   [name] Uncontrolled type 2 DM with peripheral circulatory disorder ([place]) 10/04/2013  .  URETHRAL STRICTURE 06/24/2009   self catheterizes.       Current Medications, Allergies, Past Medical History, Past Surgical History, Family History and Social History were reviewed in [employer] record.   Physical Exam: BP 126/68   Pulse 65   Temp 98.2 F (36.8 C)   Ht 6' (1.829 m)   Wt 300 lb (136.1 kg)   BMI 40.69 kg/m  General: Well developed  69 year old male in no acute distress. Head: Normocephalic and atraumatic. Eyes:  No scleral icterus. Conjunctiva pink . Ears: Normal auditory acuity. Lungs: Clear throughout to auscultation. Heart: Regular rate and rhythm, no murmur. Abdomen: Soft, nontender and nondistended. Right mid abdomen with firm area, not a discrete mass, possible stool in the colon. No hepatomegaly. Normal bowel sounds x 4 quadrants.  Rectal: Patient declined.  Musculoskeletal: Symmetrical with no gross deformities. Extremities: Right leg prosthesis. LLE no edema.  Neurological: Alert oriented x 4. No focal deficits.  Psychological:  Alert and cooperative. Normal mood and affect  Assessment and Recommendations:  73. 69 year old male with chronic iron deficiency anemia  No obvious signs of GI bleeding.  Hg 10.5 ( Hg 13. 8 on 06/03/2019). Iron 23. Transferrin 224. Vitamin B12 580. At  Increased risk for PUD/UGI bleeding on Effient + ASA and Celebrex 258m po bid.  -EGD benefits and risks discussed including risk with sedation, risk of bleeding, perforation and infection  -Our office will call cardiologist Dr. MAngelena Formto verify if ok to hold Effient for 5 days -Continue Pantoprazole 432mQD  -Heme slides  -Recheck repeat CBC, iron panel, tTg and IgA in 4 weeks.  -Ferrous Sulfate 32518mne tab po daily, if he cannot afford to purchase consider IV iron infusion  -If EGD negative, consider small bowel capsule endoscopy.   2. Dysphagia -See plan in # 1 -Patient to drink 3 separate sips of  water before swallowing any food or medications,  cut food in small pieces and chew food thoroughly  3. History of colon polyps. Last colonoscopy was 11/19/2018. Next colonoscopy due 10/2021.   4. History of CAD with a total of 9 coronary stents on ASA 68m daily and Effient 582m

## 2019-08-05 NOTE — Patient Instructions (Signed)
If you are age 67 or older, your body mass index should be between 23-30. Your Body mass index is 40.69 kg/m. If this is out of the aforementioned range listed, please consider follow up with your Primary Care Provider.  If you are age 29 or younger, your body mass index should be between 19-25. Your Body mass index is 40.69 kg/m. If this is out of the aformentioned range listed, please consider follow up with your Primary Care Provider.   Please purchase the following medications over the counter and take as directed:  Start Ferrous Sulfate 361m one tablet by mouth once daily, if not affordable please consider IV iron.  Please follow the directions for the hemocultt slides given and return them to our lab.   Take 3 sips of water prior to swallowing any food or pills, cut food into small pieces chew food thoroughly   [facility] CRNP

## 2019-08-06 DIAGNOSIS — R6889 Other general symptoms and signs: Secondary | ICD-10-CM | POA: Diagnosis not present

## 2019-08-07 DIAGNOSIS — N3941 Urge incontinence: Secondary | ICD-10-CM | POA: Diagnosis not present

## 2019-08-07 DIAGNOSIS — R6889 Other general symptoms and signs: Secondary | ICD-10-CM | POA: Diagnosis not present

## 2019-08-07 NOTE — Telephone Encounter (Signed)
[employer] will contact office when prior authorization is needed

## 2019-08-08 ENCOUNTER — Other Ambulatory Visit (INDEPENDENT_AMBULATORY_CARE_PROVIDER_SITE_OTHER): Payer: Medicare HMO

## 2019-08-08 DIAGNOSIS — D508 Other iron deficiency anemias: Secondary | ICD-10-CM

## 2019-08-08 DIAGNOSIS — R1319 Other dysphagia: Secondary | ICD-10-CM

## 2019-08-08 LAB — HEMOCCULT SLIDES (X 3 CARDS)
Fecal Occult Blood: NEGATIVE
OCCULT 1: NEGATIVE
OCCULT 2: NEGATIVE
OCCULT 3: NEGATIVE
OCCULT 4: NEGATIVE
OCCULT 5: NEGATIVE

## 2019-08-09 ENCOUNTER — Other Ambulatory Visit: Payer: Self-pay | Admitting: Internal Medicine

## 2019-08-11 ENCOUNTER — Other Ambulatory Visit: Payer: Self-pay | Admitting: Cardiovascular Disease

## 2019-08-11 ENCOUNTER — Other Ambulatory Visit (INDEPENDENT_AMBULATORY_CARE_PROVIDER_SITE_OTHER): Payer: Self-pay | Admitting: Specialist

## 2019-08-11 ENCOUNTER — Other Ambulatory Visit (HOSPITAL_COMMUNITY): Payer: Self-pay | Admitting: Psychiatry

## 2019-08-11 ENCOUNTER — Telehealth: Payer: Self-pay | Admitting: Neurology

## 2019-08-11 ENCOUNTER — Encounter: Payer: Self-pay | Admitting: General Surgery

## 2019-08-11 ENCOUNTER — Telehealth: Payer: Self-pay | Admitting: General Surgery

## 2019-08-11 ENCOUNTER — Telehealth: Payer: Self-pay | Admitting: Nurse Practitioner

## 2019-08-11 DIAGNOSIS — F319 Bipolar disorder, unspecified: Secondary | ICD-10-CM

## 2019-08-11 DIAGNOSIS — F411 Generalized anxiety disorder: Secondary | ICD-10-CM

## 2019-08-11 NOTE — Telephone Encounter (Signed)
Contacted Travis [name] regarding his scheduled appointment to advise him there is enough time to receive notification back from Dr Julianne Handler regarding his effient.  The patient cancelled his original appointment and would like to have all of the paperwork sent to him through mychart again for his procedure to include the information about his Covid testing.

## 2019-08-11 NOTE — Telephone Encounter (Signed)
[facility] Pre-operative Risk Assessment     Request for surgical clearance:     Endoscopy Procedure  What type of surgery is being performed?     Colonoscopy  When is this surgery scheduled?     09/08/2019  What type of clearance is required ?   Pharmacy  Are there any medications that need to be held prior to surgery and how long? Effient 08/25/2019  Practice name and name of physician performing surgery?      [facility]  What is your office phone and fax number?      Phone- [phone]  [phone]  Anesthesia type (None, local, MAC, general) ?       MAC

## 2019-08-11 NOTE — Telephone Encounter (Signed)
Have you received any information regarding the HOLD on Effient? Please advise patient of hold time Thanks Bre

## 2019-08-11 NOTE — Telephone Encounter (Signed)
Pt left a message stating, "his cpap machine has caused his nose to be stopped up." He said, "he called his DME company and was instructed to call us to see if his cpap pressure could be lowered." Please advise.

## 2019-08-11 NOTE — Telephone Encounter (Signed)
Advised the patient to hold his Effient 5 days prior to his procedure. The patient verbalized understanding.

## 2019-08-11 NOTE — Telephone Encounter (Signed)
Patient called to reschedule his EGD not sure if clearance was already obtained and the instructions for the patient since he is on Effient.

## 2019-08-11 NOTE — Telephone Encounter (Signed)
OK to hold Effient 5 days before his planned procedure.   Lauree Chandler

## 2019-08-12 NOTE — Telephone Encounter (Signed)
Called the patient. I spoke with Dr Brett Fairy and she doesn't feel that dropping the maximum pressure would help because he is not even reaching that. I have encouraged in addition to the afrin nasal spray that he is using add a saline nasal spray at bedtime. Advised to help with open the airway that way. Pt is unsure if humidification is turned on machine, advised I would check on that. Advised the patient if doing these things does not help they she would recommend a ENT referral. Pt verbalized understanding and agreed with plan.

## 2019-08-14 ENCOUNTER — Ambulatory Visit (INDEPENDENT_AMBULATORY_CARE_PROVIDER_SITE_OTHER): Payer: Medicare HMO | Admitting: Orthopaedic Surgery

## 2019-08-14 ENCOUNTER — Encounter: Payer: Self-pay | Admitting: Orthopaedic Surgery

## 2019-08-14 ENCOUNTER — Encounter: Payer: Medicare HMO | Admitting: Gastroenterology

## 2019-08-14 ENCOUNTER — Other Ambulatory Visit: Payer: Self-pay

## 2019-08-14 DIAGNOSIS — M7711 Lateral epicondylitis, right elbow: Secondary | ICD-10-CM | POA: Diagnosis not present

## 2019-08-14 DIAGNOSIS — N3941 Urge incontinence: Secondary | ICD-10-CM | POA: Diagnosis not present

## 2019-08-14 DIAGNOSIS — R6889 Other general symptoms and signs: Secondary | ICD-10-CM | POA: Diagnosis not present

## 2019-08-14 DIAGNOSIS — Z96642 Presence of left artificial hip joint: Secondary | ICD-10-CM

## 2019-08-14 MED ORDER — LIDOCAINE HCL 1 % IJ SOLN
1.0000 mL | INTRAMUSCULAR | Status: AC | PRN
  Administered 2019-08-14: 1 mL

## 2019-08-14 MED ORDER — METHYLPREDNISOLONE ACETATE 40 MG/ML IJ SUSP
40.0000 mg | INTRAMUSCULAR | Status: AC | PRN
  Administered 2019-08-14: 40 mg

## 2019-08-14 NOTE — Progress Notes (Signed)
Office Visit Note   Patient: Travis SOLLENBERGER Sr.           Date of Birth: 1950/11/08           MRN: [id number] Visit Date: 08/14/2019              Requested by: [name], MD [place],  [place] [address] PCP: [name], MD   Assessment & Plan: Visit Diagnoses:  1. Status post total replacement of left hip   2. Lateral epicondylitis, right elbow     Plan: I did provide a steroid injection in the lateral epicondyle area of the right elbow after explained the risk and benefits of these injections.  I also showed him some stretching exercises to try.  He tolerated this well and exhibited understanding of this.  From a hip standpoint, I will see him back in 6 months with a standing low AP pelvis and lateral of his left operative hip.  All question concerns were otherwise answered and addressed.  Follow-Up Instructions: Return in about 6 months (around 02/14/2020).   Orders:  Orders Placed This Encounter  Procedures  . Hand/UE Inj   No orders of the defined types were placed in this encounter.     Procedures: Hand/UE Inj: R elbow for lateral epicondylitis on 08/14/2019 1:09 PM Medications: 1 mL lidocaine 1 %; 40 mg methylPREDNISolone acetate 40 MG/ML      Clinical Data: No additional findings.   Subjective: Chief Complaint  Patient presents with  . Left Hip - Routine Post Op  The patient is a 69 year old gentleman who is now 9 weeks status post a left total hip arthroplasty.  He reports that the hip is doing very well.  He has a amputation on his other side for which he wears a prosthesis.  He is having that adjusted tomorrow for stability purposes and leg length purposes.  He reports that the left hip is doing well.  He does want me to see him today for his right elbow.  He has been dealing right elbow pain and he points to the lateral epicondyle area as a source of his pain.  He denies any injuries to the elbow but this is the side where he does  hold the cane quite a bit with his hand and a pronated position.  HPI  Review of Systems He currently denies any headache, chest pain, shortness of breath, fever, chills, nausea, vomiting  Objective: Vital Signs: There were no vitals taken for this visit.  Physical Exam He is alert and orient x3 and in no acute distress Ortho Exam Examination of his right elbow does show pain over the lateral epicondyle aspect of the elbow.  His elbow is otherwise stable with good range of motion.  Examination of his left operative hip shows full range of motion of that hip with no pain at all and the incision is healed nicely. Specialty Comments:  No specialty comments available.  Imaging: No results found.   PMFS History: Patient Active Problem List   Diagnosis Date Noted  . Cough 07/29/2019  . Wheezing 07/29/2019  . Possible exposure to STD 07/24/2019  . Unilateral primary osteoarthritis, left hip 06/06/2019  . Status post total replacement of left hip 06/06/2019  . S/P laparoscopic sleeve gastrectomy 03/03/2019  . Rash 01/24/2019  . Fever 08/15/2018  . UTI (urinary tract infection) 08/15/2018  . Fall at home, initial encounter 08/15/2018  . Chronic anemia 08/15/2018  .  Hypokalemia 08/15/2018  . Bowel incontinence 07/25/2018  . Other spondylosis with radiculopathy, lumbar region 07/16/2018    Class: Chronic  . Status post lumbar laminectomy 07/16/2018  . Status post THR (total hip replacement) 04/17/2018  . Unilateral primary osteoarthritis, right hip   . Morbid obesity with BMI of 40.0-44.9, adult ([facility]) 02/28/2018  . Myocardial infarction ([place] [facility]) 02/25/2018  . Coronary artery disease involving native coronary artery of native heart 02/25/2018  . PAD (peripheral artery disease) ([facility] [place]) 02/25/2018  . S/P BKA (below knee amputation) unilateral, right ([facility]) 02/25/2018  . Sacroiliitis ([place]) 02/25/2018  . SOB (shortness of breath) 01/03/2018  . Acute on chronic heart failure ([facility])  01/03/2018  . Severe right groin pain 12/20/2017  . Morbid obesity ([place]) 10/09/2017  . Low back pain 07/13/2017  . Leukoplakia, tongue 01/26/2017  . Hyperglycemia 10/20/2016  . Skin lesion 10/20/2016  . S/P unilateral BKA (below knee amputation), right ([facility]) 06/14/2016  . Charcot foot due to diabetes mellitus ([place])   . Charcot's arthropathy associated with type 2 diabetes mellitus ([place]) 04/11/2016  . Preventative health care 11/05/2015  . Major depression 09/13/2015  . S/P TKR (total knee replacement) bilaterally 09/13/2015  . GERD (gastroesophageal reflux disease) 09/08/2015  . S/P laparoscopic hernia repair 05/11/2015  . PPD positive 04/08/2015  . Benign neoplasm of descending colon   . Benign neoplasm of cecum   . Acute blood loss anemia   . Chronic anticoagulation   . Occult blood positive stool 10/17/2014  . General weakness 07/14/2014  . Urinary incontinence 07/14/2014  . Headache(784.0) 10/15/2013  . Spinal stenosis in cervical region 09/26/2013    Class: Chronic  . Spinal stenosis of lumbar region 09/26/2013    Class: Chronic  . Hand joint pain 06/10/2013  . Rotator cuff tear arthropathy of both shoulders 06/10/2013  . Skin lesion of cheek 05/01/2013  . Pain of right thumb 04/03/2013  . Balance disorder 03/12/2013  . Gait disorder 03/12/2013  . Tremor 03/12/2013  . Left hip pain 03/12/2013  . Pre-ulcerative corn or callous 02/06/2013  . Anxiety 11/12/2011  . OSA on CPAP 11/07/2011  . Bradycardia 10/20/2011  . Insomnia 10/04/2011  . Obesity 01/12/2011  . Diabetes mellitus type 2 in obese ([facility]) 09/27/2010  . ERECTILE DYSFUNCTION, ORGANIC 05/30/2010  . [place] DISEASE, LUMBAR 04/19/2010  . SCIATICA, LEFT 04/19/2010  . Chronic pain syndrome 10/27/2009  . Hyperlipidemia 07/15/2009  . Essential hypertension 06/24/2009  . Coronary artery disease involving native coronary artery of native heart without angina pectoris 06/24/2009  . Allergic rhinitis 06/24/2009  .  URETHRAL STRICTURE 06/24/2009  . DEGENERATIVE JOINT DISEASE 06/24/2009  . SHOULDER PAIN, BILATERAL 06/24/2009  . FATIGUE 06/24/2009  . NEPHROLITHIASIS, HX OF 06/24/2009   Past Medical History:  Diagnosis Date  . ALLERGIC RHINITIS 06/24/2009  . Allergy   . Anxiety 11/12/2011   Adequate for discharge   . Arthritis    "all my joints" (09/30/2013)  . Arthritis of foot, right, degenerative 04/15/2014  . Balance disorder 03/12/2013  . Benign neoplasm of cecum   . Benign neoplasm of descending colon   . CAD (coronary artery disease) 06/24/2009   5 stents placed in 2007    . Chronic anticoagulation   . Chronic pain syndrome 10/27/2009   of ankle, shoulders, low back.  sciatica.   . Closed fracture of right foot 10/17/2014  . CORONARY ARTERY DISEASE 06/24/2009   a. s/p multiple PCIs - In 2008 he had a Taxus DES to the mild LAD, Endeavor  DES to mid LCX and distal LCX. In January 2009 he had DES to distal LCX, mid LCX and proximal LCX. In November 2009 had BMS x 2 to the mid RCA. Cath 10/2011 with patent stents, noncardiac CP. LHC 01/2013: patent stents (noncardiac CP).  . DEGENERATIVE JOINT DISEASE 06/24/2009   Qualifier: Diagnosis of  By: [name], [name]   . Depression   . Depression with anxiety    Prior suicide attempt  . [place] DISEASE, LUMBAR 04/19/2010  . ERECTILE DYSFUNCTION, ORGANIC 05/30/2010  . Essential hypertension 06/24/2009   Qualifier: Diagnosis of  By: [name], [name]   . Fibromyalgia   . Fracture dislocation of ankle joint 09/02/2015  . Gait disorder 03/12/2013  . General weakness 07/14/2014  . GERD (gastroesophageal reflux disease) 09/08/2015  . Hand joint pain 06/10/2013  . Heart murmur   . Hepatitis C    treated pt. unknown with what he was a teenager  . History of kidney stones   . Hyperlipidemia 07/15/2009   Qualifier: Diagnosis of  By: Aundra Dubin, MD, [place]    . HYPERLIPIDEMIA-MIXED 07/15/2009  . HYPERTENSION 06/24/2009  . Insomnia 10/04/2011  . Irregular heart beat   . Left hip pain  03/12/2013   Injected under ultrasound guidance on June 24, 2013   . Major depression 09/13/2015  . Myocardial infarction ([facility]) Jul 31, 2006  . Non-cardiac chest pain 10/2011, 01/2013  . Obesity   . Occult blood positive stool 10/17/2014  . Open ankle fracture 09/02/2015  . OSA (obstructive sleep apnea)    not using CPAP (09/30/2013)  . Pain of right thumb 04/03/2013  . Pneumonia   . PPD positive 04/08/2015  . Pre-ulcerative corn or callous 02/06/2013  . Rotator cuff tear arthropathy of both shoulders 06/10/2013   History of bilateral shoulder cuff surgery for rotator cuff tears. Reports increase in pain 09/11/2015 during physical therapy of the left shoulder.   [name] SCIATICA, LEFT 04/19/2010   Qualifier: Diagnosis of  By: [name], [name]   . Sleep apnea    wears cpap   . Spinal stenosis in cervical region 09/26/2013  . Spinal stenosis, lumbar region, with neurogenic claudication 09/26/2013  . Type II diabetes mellitus ([place] [place]) 2010-07-31   no meds in 09/2014.   [name] Uncontrolled type 2 DM with peripheral circulatory disorder ([place]) 10/04/2013  . URETHRAL STRICTURE 06/24/2009   self catheterizes.     Family History  Problem Relation Age of Onset  . Depression Mother   . Heart disease Mother   . Hypertension Mother   . Breast cancer Mother   . Cancer Mother   . Diabetes Father   . Heart disease Father        CABG  . Hypertension Father   . Hyperlipidemia Father   . Prostate cancer Father   . Skin cancer Father   . Depression Brother        x 2  . Hypertension Brother        x2  . Healthy Son   . Heart disease Maternal Grandfather   . Early death Maternal Grandfather   . Heart attack Maternal Grandfather August 01, 2063  . Early death Paternal Grandfather   . Coronary artery disease Other   . Hypertension Other   . Depression Other   . Healthy Son   . Colon cancer Neg Hx   . Colon polyps Neg Hx   . Esophageal cancer Neg Hx   . Rectal cancer Neg Hx   . Stomach cancer  Neg Hx     Past Surgical History:    Procedure Laterality Date  . AMPUTATION Right 06/14/2016   Procedure: AMPUTATION BELOW KNEE;  Surgeon: Newt Minion, MD;  Location: [facility];  Service: Orthopedics;  Laterality: Right;  . ANKLE FUSION Right 04/15/2014   Procedure: Right Subtalar, Talonavicular Fusion;  Surgeon: Newt Minion, MD;  Location: [facility];  Service: Orthopedics;  Laterality: Right;  . ANKLE FUSION Right 04/18/2016   Procedure: Right Ankle Tibiocalcaneal Fusion;  Surgeon: Newt Minion, MD;  Location: [facility];  Service: Orthopedics;  Laterality: Right;  . ANTERIOR CERVICAL DECOMP/DISCECTOMY FUSION N/A 09/26/2013   Procedure: ANTERIOR CERVICAL DISCECTOMY FUSION C3-4, plate and screw fixation, allograft bone graft;  Surgeon: Jessy Oto, MD;  Location: [facility];  Service: Orthopedics;  Laterality: N/A;  . BACK SURGERY    . BELOW KNEE LEG AMPUTATION Right 06/14/2016   right ankle and foot  . CARDIAC CATHETERIZATION  X 1  . CARPAL TUNNEL RELEASE Bilateral   . COLONOSCOPY    . COLONOSCOPY N/A 10/22/2014   Procedure: COLONOSCOPY;  Surgeon: Lafayette Dragon, MD;  Location[facility] ENDOSCOPY;  Service: Endoscopy;  Laterality: N/A;  . CORONARY ANGIOPLASTY WITH STENT PLACEMENT     "I have 9 stents"  . ESOPHAGOGASTRODUODENOSCOPY N/A 10/19/2014   Procedure: ESOPHAGOGASTRODUODENOSCOPY (EGD);  Surgeon: Jerene Bears, MD;  Location: [facility] ENDOSCOPY;  Service: Endoscopy;  Laterality: N/A;  . FRACTURE SURGERY    . FUSION OF TALONAVICULAR JOINT Right 04/15/2014   dr duda  . HERNIA REPAIR     umbilical  . INGUINAL HERNIA REPAIR Right 05/11/2015   Procedure: LAPAROSCOPIC REPAIR RIGHT  INGUINAL HERNIA;  Surgeon: Greer Pickerel, MD;  Location: [facility];  Service: General;  Laterality: Right;  . INSERTION OF MESH Right 05/11/2015   Procedure: INSERTION OF MESH;  Surgeon: Greer Pickerel, MD;  Location: [facility];  Service: General;  Laterality: Right;  . JOINT REPLACEMENT    . KNEE CARTILAGE SURGERY Right X 12   "~ 1/2 open; ~ 1/2 scopes"  . KNEE CARTILAGE SURGERY  Left X 3   "3 scopes"  . LAPAROSCOPIC GASTRIC SLEEVE RESECTION N/A 03/03/2019   Procedure: LAPAROSCOPIC GASTRIC SLEEVE RESECTION, Upper Endo, ERAS Pathway;  Surgeon: Greer Pickerel, MD;  Location: [facility];  Service: General;  Laterality: N/A;  . LEFT HEART CATHETERIZATION WITH CORONARY ANGIOGRAM N/A 02/10/2013   Procedure: LEFT HEART CATHETERIZATION WITH CORONARY ANGIOGRAM;  Surgeon: Burnell Blanks, MD;  Location: [facility] CATH LAB;  Service: Cardiovascular;  Laterality: N/A;  . LUMBAR LAMINECTOMY N/A 07/16/2018   Procedure: LEFT L4-5 REDO PARTIAL LUMBAR HEMILAMINECTOMY WITH FORAMINOTOMY LEFT L4;  Surgeon: Jessy Oto, MD;  Location: [facility];  Service: Orthopedics;  Laterality: N/A;  . LUMBAR LAMINECTOMY/DECOMPRESSION MICRODISCECTOMY N/A 01/27/2014   Procedure: CENTRAL LUMBAR LAMINECTOMY L4-5 AND L3-4;  Surgeon: Jessy Oto, MD;  Location: [facility];  Service: Orthopedics;  Laterality: N/A;  . ORIF ANKLE FRACTURE Right 09/02/2015   Procedure: OPEN REDUCTION INTERNAL FIXATION (ORIF) ANKLE FRACTURE;  Surgeon: Newt Minion, MD;  Location: [facility];  Service: Orthopedics;  Laterality: Right;  . PERIPHERALLY INSERTED CENTRAL CATHETER INSERTION  09/02/2015  . POLYPECTOMY    . SHOULDER ARTHROSCOPY W/ ROTATOR CUFF REPAIR Bilateral    "3 on the right; 1 on the left"  . SKIN SPLIT GRAFT Right 10/01/2015   Procedure: RIGHT ANKLE APPLY SKIN GRAFT SPLIT THICKNESS;  Surgeon: Newt Minion, MD;  Location: [facility];  Service: Orthopedics;  Laterality: Right;  . TONSILLECTOMY    . TOOTH EXTRACTION    . TOTAL HIP ARTHROPLASTY Right 04/17/2018  . TOTAL HIP ARTHROPLASTY Right 04/17/2018   Procedure: RIGHT TOTAL HIP ARTHROPLASTY;  Surgeon: Newt Minion, MD;  Location: [facility];  Service: Orthopedics;  Laterality: Right;  . TOTAL HIP ARTHROPLASTY Left 06/06/2019   Procedure: LEFT TOTAL HIP ARTHROPLASTY ANTERIOR APPROACH;  Surgeon: Mcarthur Rossetti, MD;  Location: [facility];  Service: Orthopedics;  Laterality: Left;  . TOTAL  KNEE ARTHROPLASTY Bilateral 2008  . UMBILICAL HERNIA REPAIR     UHR  . UPPER GASTROINTESTINAL ENDOSCOPY    . URETHRAL DILATION  X 4  . VASECTOMY    . WISDOM TOOTH EXTRACTION     Social History   Occupational History  . Occupation: disabled since 2006 due to ortho. heart, psych    Employer: UNEMPLOYED  . Occupation: part time work as an Multimedia programmer, wrestling, and Holiday representative  Tobacco Use  . Smoking status: Former Smoker    Types: Cigars    Quit date: 08/28/2010    Years since quitting: 8.9  . Smokeless tobacco: Never Used  . Tobacco comment: 04/18/2016 "smoked 1 cigar/wk when I did smoke"  Substance and Sexual Activity  . Alcohol use: Not Currently    Alcohol/week: 0.0 standard drinks    Comment: rarely  . Drug use: No  . Sexual activity: Not Currently

## 2019-08-15 DIAGNOSIS — R6889 Other general symptoms and signs: Secondary | ICD-10-CM | POA: Diagnosis not present

## 2019-08-15 DIAGNOSIS — Z89511 Acquired absence of right leg below knee: Secondary | ICD-10-CM | POA: Diagnosis not present

## 2019-08-18 ENCOUNTER — Encounter (HOSPITAL_COMMUNITY): Payer: Self-pay | Admitting: Psychiatry

## 2019-08-18 ENCOUNTER — Other Ambulatory Visit: Payer: Self-pay

## 2019-08-18 ENCOUNTER — Ambulatory Visit (INDEPENDENT_AMBULATORY_CARE_PROVIDER_SITE_OTHER): Payer: Medicare HMO | Admitting: Psychiatry

## 2019-08-18 DIAGNOSIS — F319 Bipolar disorder, unspecified: Secondary | ICD-10-CM | POA: Diagnosis not present

## 2019-08-18 DIAGNOSIS — F411 Generalized anxiety disorder: Secondary | ICD-10-CM | POA: Diagnosis not present

## 2019-08-18 MED ORDER — DULOXETINE HCL 30 MG PO CPEP: 30.0000 mg | ORAL_CAPSULE | Freq: Two times a day (BID) | ORAL | 0 refills | Status: DC

## 2019-08-18 MED ORDER — BENZTROPINE MESYLATE 0.5 MG PO TABS: 0.5000 mg | ORAL_TABLET | Freq: Every day | ORAL | 0 refills | Status: DC

## 2019-08-18 MED ORDER — ARIPIPRAZOLE 5 MG PO TABS: 5.0000 mg | ORAL_TABLET | Freq: Every day | ORAL | 0 refills | Status: DC

## 2019-08-18 MED ORDER — TRAZODONE HCL 150 MG PO TABS: 150.0000 mg | ORAL_TABLET | Freq: Every day | ORAL | 0 refills | Status: DC

## 2019-08-18 NOTE — Progress Notes (Signed)
Virtual Visit via Telephone Note  I connected with Travis [name]. on 08/18/19 at  9:40 AM EDT by telephone and verified that I am speaking with the correct person using two identifiers.   I discussed the limitations, risks, security and privacy concerns of performing an evaluation and management service by telephone and the availability of in person appointments. I also discussed with the patient that there may be a patient responsible charge related to this service. The patient expressed understanding and agreed to proceed.   History of Present Illness: Patient was evaluated by phone session.  Recently he had left hip replacement and he is recovering from the surgery.  He is happy that he start walking 3 times half an hour every time.  He feels more energy and more motivation.  He has cut down his Klonopin and taking only 0.5 mg daily which is prescribed by his PCP Dr. Ronnald Ramp.  He denies any mood swings, irritability, mania or any psychosis.  He is sleeping good with the trazodone.  He has no tremors or shakes.  Since the bariatric surgery he had lost so far 40 pounds.  Denies drinking or using any illegal substances.  He like to keep his current medication.   Past Psychiatric History:Reviewed [facility] in [place] and [place]. H/Omood swings,suicidal gesture,anger,hallucination, irritability and increase spending. Married 5 times. H/Oinpatient in 2014 due to overdose on Xanaxandalcohol.DidIOP. TookEffexor, Xanax, Klonopin, Ambien,Lunesta, Wellbutrin, Ambien, Remeronand Prozac. Abilify started in our office.  Recent Results (from the past 2160 hour(s))  CBC     Status: None   Collection Time: 06/03/19 11:14 AM  Result Value Ref Range   WBC 6.6 4.0 - 10.5 K/uL   RBC 4.69 4.22 - 5.81 MIL/uL   Hemoglobin 13.8 13.0 - 17.0 g/dL   HCT 42.3 39.0 - 52.0 %   MCV 90.2 80.0 - 100.0 fL   MCH 29.4 26.0 - 34.0 pg   MCHC 32.6 30.0 - 36.0 g/dL   RDW 14.2 11.5 - 15.5 %    Platelets 285 150 - 400 K/uL   nRBC 0.0 0.0 - 0.2 %    Comment: Performed at [facility], [place] [address], [place], [place] [address]  Basic metabolic panel     Status: Abnormal   Collection Time: 06/03/19 11:14 AM  Result Value Ref Range   Sodium 137 135 - 145 mmol/L   Potassium 3.6 3.5 - 5.1 mmol/L   Chloride 103 98 - 111 mmol/L   CO2 24 22 - 32 mmol/L   Glucose, Bld 114 (H) 70 - 99 mg/dL   BUN 30 (H) 8 - 23 mg/dL   Creatinine, Ser 0.94 0.61 - 1.24 mg/dL   Calcium 9.1 8.9 - 10.3 mg/dL   GFR calc non Af Amer >60 >60 mL/min   GFR calc Af Amer >60 >60 mL/min   Anion gap 10 5 - 15    Comment: Performed at [facility] [address] [place], [place] [address]  Surgical pcr screen     Status: None   Collection Time: 06/03/19 11:20 AM   Specimen: Nasal Mucosa; Nasal Swab  Result Value Ref Range   MRSA, PCR NEGATIVE NEGATIVE   Staphylococcus aureus NEGATIVE NEGATIVE    Comment: (NOTE) The Xpert SA Assay (FDA approved for NASAL specimens in patients 35 years of age and older), is one component of a comprehensive surveillance program. It is not intended to diagnose infection nor to guide or monitor treatment. Performed at  [facility], [place] [address] [place], [place] [address]   Protime-INR     Status: None   Collection Time: 06/03/19 11:21 AM  Result Value Ref Range   Prothrombin Time 13.6 11.4 - 15.2 seconds   INR 1.1 0.8 - 1.2    Comment: (NOTE) INR goal varies based on device and disease states. Performed at [facility], [place] [address], [place], [place] [address]   Novel Coronavirus, NAA (Hosp order, Send-out to Ref Lab; TAT 18-24 hrs     Status: None   Collection Time: 06/03/19 12:06 PM   Specimen: Nasopharyngeal Swab; Respiratory  Result Value Ref Range   SARS-CoV-2, NAA NOT DETECTED NOT DETECTED    Comment: (NOTE) This nucleic acid amplification test was developed and its performance characteristics determined by [employer]. Nucleic acid amplification tests include PCR and TMA. This test has not been FDA cleared or approved. This test has been authorized by FDA under an Emergency Use Authorization (EUA). This test is only authorized for the duration of time the declaration that circumstances exist justifying the authorization of the emergency use of in vitro diagnostic tests for detection of SARS-CoV-2 virus and/or diagnosis of COVID-19 infection under section 564(b)(1) of the Act, 21 U.S.C. 270WCB-7(S) (1), unless the authorization is terminated or revoked sooner. When diagnostic testing is negative, the possibility of a false negative result should be considered in the context of a patient's recent exposures and the presence of clinical signs and symptoms consistent with COVID-19. An individual without symptoms of COVID- 19 and who is not shedding SARS-CoV-2 vi rus would expect to have a negative (not detected) result in this assay. Performed At: [facility] [place], [place] [id number] Rush Farmer MD YW:[id number]    Coronavirus Source NASOPHARYNGEAL     Comment: Performed at [facility], [place] [address] [place], [place] [address]  Glucose, capillary     Status: Abnormal   Collection Time: 06/06/19  9:39 AM  Result Value Ref Range   Glucose-Capillary 124 (H) 70 - 99 mg/dL  Glucose, capillary     Status: Abnormal   Collection Time: 06/06/19  2:00 PM  Result Value Ref Range   Glucose-Capillary 107 (H) 70 - 99 mg/dL  Glucose, capillary     Status: Abnormal   Collection Time: 06/06/19  9:04 PM  Result Value Ref Range   Glucose-Capillary 153 (H) 70 - 99 mg/dL   Comment 1 Notify RN    Comment 2 Document in Chart   Glucose, capillary     Status: Abnormal   Collection Time: 06/07/19  6:49 AM  Result Value Ref Range   Glucose-Capillary 154 (H) 70 - 99 mg/dL   Comment 1 Notify RN    Comment 2 Document in Chart   CBC     Status: Abnormal   Collection Time:  06/07/19  9:18 AM  Result Value Ref Range   WBC 7.5 4.0 - 10.5 K/uL   RBC 3.25 (L) 4.22 - 5.81 MIL/uL   Hemoglobin 9.6 (L) 13.0 - 17.0 g/dL   HCT 28.7 (L) 39.0 - 52.0 %   MCV 88.3 80.0 - 100.0 fL   MCH 29.5 26.0 - 34.0 pg   MCHC 33.4 30.0 - 36.0 g/dL   RDW 14.4 11.5 - 15.5 %   Platelets 229 150 - 400 K/uL   nRBC 0.0 0.0 - 0.2 %    Comment: Performed at [facility], [address],  [place], [place] [address]  Basic metabolic panel     Status: Abnormal   Collection Time: 06/07/19  9:18 AM  Result Value Ref Range   Sodium 136 135 - 145 mmol/L   Potassium 3.4 (L) 3.5 - 5.1 mmol/L   Chloride 101 98 - 111 mmol/L   CO2 26 22 - 32 mmol/L   Glucose, Bld 165 (H) 70 - 99 mg/dL   BUN 21 8 - 23 mg/dL   Creatinine, Ser 1.15 0.61 - 1.24 mg/dL   Calcium 8.3 (L) 8.9 - 10.3 mg/dL   GFR calc non Af Amer >60 >60 mL/min   GFR calc Af Amer >60 >60 mL/min   Anion gap 9 5 - 15    Comment: Performed at [facility] [address] [place], [place] [address]  Glucose, capillary     Status: Abnormal   Collection Time: 06/07/19 12:01 PM  Result Value Ref Range   Glucose-Capillary 119 (H) 70 - 99 mg/dL  CBC     Status: Abnormal   Collection Time: 06/08/19  7:30 AM  Result Value Ref Range   WBC 7.6 4.0 - 10.5 K/uL   RBC 3.27 (L) 4.22 - 5.81 MIL/uL   Hemoglobin 9.7 (L) 13.0 - 17.0 g/dL   HCT 29.3 (L) 39.0 - 52.0 %   MCV 89.6 80.0 - 100.0 fL   MCH 29.7 26.0 - 34.0 pg   MCHC 33.1 30.0 - 36.0 g/dL   RDW 14.2 11.5 - 15.5 %   Platelets 227 150 - 400 K/uL   nRBC 0.0 0.0 - 0.2 %    Comment: Performed at [facility], [place] [address] [place], [place] [address]  CBC     Status: Abnormal   Collection Time: 06/09/19  5:20 AM  Result Value Ref Range   WBC 6.3 4.0 - 10.5 K/uL   RBC 3.08 (L) 4.22 - 5.81 MIL/uL   Hemoglobin 9.3 (L) 13.0 - 17.0 g/dL   HCT 27.5 (L) 39.0 - 52.0 %   MCV 89.3 80.0 - 100.0 fL   MCH 30.2 26.0 - 34.0 pg   MCHC 33.8 30.0 - 36.0 g/dL   RDW 14.1 11.5 - 15.5 %    Platelets 252 150 - 400 K/uL   nRBC 0.0 0.0 - 0.2 %    Comment: Performed at [facility], [place] [address], [place], [place] [address]  SARS CORONAVIRUS 2 (TAT 6-24 HRS) Nasopharyngeal Nasopharyngeal Swab     Status: None   Collection Time: 06/10/19  1:54 PM   Specimen: Nasopharyngeal Swab  Result Value Ref Range   SARS Coronavirus 2 NEGATIVE NEGATIVE    Comment: (NOTE) SARS-CoV-2 target nucleic acids are NOT DETECTED. The SARS-CoV-2 RNA is generally detectable in upper and lower respiratory specimens during the acute phase of infection. Negative results do not preclude SARS-CoV-2 infection, do not rule out co-infections with other pathogens, and should not be used as the sole basis for treatment or other patient management decisions. Negative results must be combined with clinical observations, patient history, and epidemiological information. The expected result is Negative. Fact Sheet for Patients: SugarRoll.be Fact Sheet for Healthcare Providers: https://www.woods-mathews.com/ This test is not yet approved or cleared by the [place] FDA and  has been authorized for detection and/or diagnosis of SARS-CoV-2 by FDA under an Emergency Use Authorization (EUA). This EUA will remain  in effect (meaning this test can be used) for the duration of the COVID-19 declaration under Section 56 4(b)(1) of  the Act, 21 U.S.C. section 360bbb-3(b)(1), unless the authorization is terminated or revoked sooner. Performed at [facility], [place] [address], [place], [place] [address]   IBC panel     Status: Abnormal   Collection Time: 07/24/19  2:47 PM  Result Value Ref Range   Iron 23 (L) 42 - 165 ug/dL   Transferrin 224.0 212.0 - 360.0 mg/dL   Saturation Ratios 7.3 (L) 20.0 - 50.0 %  VITAMIN D 25 Hydroxy (Vit-D Deficiency, Fractures)     Status: Abnormal   Collection Time: 07/24/19  2:47 PM  Result Value Ref Range   VITD 24.67 (L) 30.00 -  100.00 ng/mL  Vitamin B12     Status: None   Collection Time: 07/24/19  2:47 PM  Result Value Ref Range   Vitamin B-12 580 211 - 911 pg/mL  PSA     Status: None   Collection Time: 07/24/19  2:47 PM  Result Value Ref Range   PSA 0.56 0.10 - 4.00 ng/mL    Comment: Test performed using Access Hybritech PSA Assay, a parmagnetic partical, chemiluminecent immunoassay.  TSH     Status: None   Collection Time: 07/24/19  2:47 PM  Result Value Ref Range   TSH 1.58 0.35 - 4.50 uIU/mL  Urinalysis, Routine w reflex microscopic     Status: None   Collection Time: 07/24/19  2:47 PM  Result Value Ref Range   Color, Urine YELLOW Yellow;Lt. Yellow;Straw;Dark Yellow;Amber;Green;Red;Brown   APPearance CLEAR Clear;Turbid;Slightly Cloudy;Cloudy   Specific Gravity, Urine 1.025 1.000 - 1.030   pH 5.5 5.0 - 8.0   Total Protein, Urine NEGATIVE Negative   Urine Glucose NEGATIVE Negative   Ketones, ur NEGATIVE Negative   Bilirubin Urine NEGATIVE Negative   Hgb urine dipstick NEGATIVE Negative   Urobilinogen, UA 0.2 0.0 - 1.0   Leukocytes,Ua NEGATIVE Negative   Nitrite NEGATIVE Negative   WBC, UA 0-2/hpf 0-2/hpf   RBC / HPF none seen 0-2/hpf   Squamous Epithelial / LPF Rare(0-4/hpf) Rare(0-4/hpf)  CBC with Differential/Platelet     Status: Abnormal   Collection Time: 07/24/19  2:47 PM  Result Value Ref Range   WBC 5.6 4.0 - 10.5 K/uL   RBC 3.74 (L) 4.22 - 5.81 Mil/uL   Hemoglobin 10.5 (L) 13.0 - 17.0 g/dL   HCT 32.5 (L) 39.0 - 52.0 %   MCV 87.0 78.0 - 100.0 fl   MCHC 32.2 30.0 - 36.0 g/dL   RDW 16.4 (H) 11.5 - 15.5 %   Platelets 293.0 150.0 - 400.0 K/uL   Neutrophils Relative % 53.2 43.0 - 77.0 %   Lymphocytes Relative 27.1 12.0 - 46.0 %   Monocytes Relative 8.6 3.0 - 12.0 %   Eosinophils Relative 9.9 (H) 0.0 - 5.0 %   Basophils Relative 1.2 0.0 - 3.0 %   Neutro Abs 3.0 1.4 - 7.7 K/uL   Lymphs Abs 1.5 0.7 - 4.0 K/uL   Monocytes Absolute 0.5 0.1 - 1.0 K/uL   Eosinophils Absolute 0.6 0.0 - 0.7  K/uL   Basophils Absolute 0.1 0.0 - 0.1 K/uL  Hepatic function panel     Status: None   Collection Time: 07/24/19  2:47 PM  Result Value Ref Range   Total Bilirubin 0.3 0.2 - 1.2 mg/dL   Bilirubin, Direct 0.1 0.0 - 0.3 mg/dL   Alkaline Phosphatase 73 39 - 117 U/L   AST 17 0 - 37 U/L   ALT 11 0 - 53 U/L   Total Protein  6.5 6.0 - 8.3 g/dL   Albumin 3.9 3.5 - 5.2 g/dL  Basic metabolic panel     Status: Abnormal   Collection Time: 07/24/19  2:47 PM  Result Value Ref Range   Sodium 141 135 - 145 mEq/L   Potassium 3.9 3.5 - 5.1 mEq/L   Chloride 104 96 - 112 mEq/L   CO2 30 19 - 32 mEq/L   Glucose, Bld 115 (H) 70 - 99 mg/dL   BUN 25 (H) 6 - 23 mg/dL   Creatinine, Ser 1.04 0.40 - 1.50 mg/dL   GFR 71.20 >60.00 mL/min   Calcium 9.0 8.4 - 10.5 mg/dL  Lipid panel     Status: None   Collection Time: 07/24/19  2:47 PM  Result Value Ref Range   Cholesterol 108 0 - 200 mg/dL    Comment: ATP III Classification       Desirable:  < 200 mg/dL               Borderline High:  200 - 239 mg/dL          High:  > = 240 mg/dL   Triglycerides 92.0 0.0 - 149.0 mg/dL    Comment: Normal:  <150 mg/dLBorderline High:  150 - 199 mg/dL   HDL 40.90 >39.00 mg/dL   VLDL 18.4 0.0 - 40.0 mg/dL   LDL Cholesterol 49 0 - 99 mg/dL   Total CHOL/HDL Ratio 3     Comment:                Men          Women1/2 Average Risk     3.4          3.3Average Risk          5.0          4.42X Average Risk          9.6          7.13X Average Risk          15.0          11.0                       NonHDL 67.18     Comment: NOTE:  Non-HDL goal should be 30 mg/dL higher than patient's LDL goal (i.e. LDL goal of < 70 mg/dL, would have non-HDL goal of < 100 mg/dL)  Hemoglobin A1c     Status: None   Collection Time: 07/24/19  2:47 PM  Result Value Ref Range   Hgb A1c MFr Bld 5.3 4.6 - 6.5 %    Comment: Glycemic Control Guidelines for People with Diabetes:Non Diabetic:  <6%Goal of Therapy: <7%Additional Action Suggested:  >8%   Microalbumin /  creatinine urine ratio     Status: None   Collection Time: 07/24/19  2:47 PM  Result Value Ref Range   Microalb, Ur <0.7 0.0 - 1.9 mg/dL   Creatinine,U 147.1 mg/dL   Microalb Creat Ratio 0.5 0.0 - 30.0 mg/g  Testosterone     Status: Abnormal   Collection Time: 07/24/19  2:47 PM  Result Value Ref Range   Testosterone 258.98 (L) 300.00 - 890.00 ng/dL  HSV 2 antibody, IgG     Status: None   Collection Time: 07/24/19  2:47 PM  Result Value Ref Range   HSV 2 Glycoprotein G Ab, IgG <0.90 index    Comment:  Index          Interpretation                           -----          --------------                           <0.90          Negative                           0.90-1.09      Equivocal                           >1.09          Positive . This assay utilizes recombinant type-specific antigens to differentiate HSV-1 from HSV-2 infections. A positive result cannot distinguish between recent and past infection. If recent HSV infection is suspected but the results are negative or equivocal, the assay should be repeated in 4-6 weeks. The performance characteristics of the assay have not been established for pediatric populations, immunocompromised patients, or neonatal screening.   RPR     Status: None   Collection Time: 07/24/19  2:47 PM  Result Value Ref Range   RPR Ser Ql NON-REACTIVE NON-REACTI  HIV Antibody (routine testing w rflx)     Status: None   Collection Time: 07/24/19  2:47 PM  Result Value Ref Range   HIV 1&2 Ab, 4th Generation NON-REACTIVE NON-REACTI    Comment: HIV-1 antigen and HIV-1/HIV-2 antibodies were not detected. There is no laboratory evidence of HIV infection. [name] PLEASE NOTE: This information has been disclosed to you from records whose confidentiality may be protected by state law.  If your state requires such protection, then the state law prohibits you from making any further disclosure of the information without the specific  written consent of the person to whom it pertains, or as otherwise permitted by law. A general authorization for the release of medical or other information is NOT sufficient for this purpose. . For additional information please refer to http://education.questdiagnostics.com/faq/FAQ106 (This link is being provided for informational/ educational purposes only.) . [name] The performance of this assay has not been clinically validated in patients less than 64 years old. [name]   GC/Chlamydia Probe Amp     Status: None   Collection Time: 07/24/19  2:47 PM   Specimen: Blood   BLD  Result Value Ref Range   Chlamydia trachomatis, NAA Negative Negative   Neisseria Gonorrhoeae by PCR Negative Negative  Hemoccult Cards (X3 cards)     Status: None   Collection Time: 08/08/19  8:33 AM  Result Value Ref Range   OCCULT 1 Negative Negative   OCCULT 2 Negative Negative   OCCULT 3 Negative Negative   OCCULT 4 Negative Negative   OCCULT 5 Negative Negative   Fecal Occult Blood Negative Negative      Psychiatric Specialty Exam: Physical Exam  Review of Systems  There were no vitals taken for this visit.There is no height or weight on file to calculate BMI.  General Appearance: NA  Eye Contact:  NA  Speech:  Clear and Coherent  Volume:  Normal  Mood:  Euthymic  Affect:  NA  Thought Process:  Goal Directed  Orientation:  Full (Time, Place, and Person)  Thought Content:  WDL  Suicidal Thoughts:  No  Homicidal Thoughts:  No  Memory:  Immediate;   Good Recent;   Good Remote;   Good  Judgement:  Good  Insight:  Good  Psychomotor Activity:  NA  Concentration:  Concentration: Fair and Attention Span: Fair  Recall:  Good  Fund of Knowledge:  Good  Language:  Good  Akathisia:  No  Handed:  Right  AIMS (if indicated):     Assets:  Communication Skills Desire for Improvement Housing Resilience  ADL's:  Intact  Cognition:  WNL  Sleep:   good      Assessment and Plan: Bipolar disorder  type I.  Generalized anxiety disorder.  I reviewed blood work results done on March 4.  His hemoglobin A1c is 5.3.  Continue Cymbalta 30 mg twice a day, Cogentin 0.5 mg at bedtime, Abilify 5 mg daily and trazodone 150 mg at bedtime.  He has cut down his Klonopin to 0.5 mg is prescribed by PCP.  Discussed medication side effects and benefits.  Recommended to call us back if is any question of any concern.  Follow-up in 3 months.  Follow Up Instructions:    I discussed the assessment and treatment plan with the patient. The patient was provided an opportunity to ask questions and all were answered. The patient agreed with the plan and demonstrated an understanding of the instructions.   The patient was advised to call back or seek an in-person evaluation if the symptoms worsen or if the condition fails to improve as anticipated.  I provided 20 minutes of non-face-to-face time during this encounter.   Kathlee Nations, MD

## 2019-08-21 DIAGNOSIS — N3941 Urge incontinence: Secondary | ICD-10-CM | POA: Diagnosis not present

## 2019-08-21 DIAGNOSIS — R6889 Other general symptoms and signs: Secondary | ICD-10-CM | POA: Diagnosis not present

## 2019-08-21 DIAGNOSIS — R3915 Urgency of urination: Secondary | ICD-10-CM | POA: Diagnosis not present

## 2019-08-22 ENCOUNTER — Ambulatory Visit: Payer: Medicare HMO | Attending: Internal Medicine

## 2019-08-22 DIAGNOSIS — M961 Postlaminectomy syndrome, not elsewhere classified: Secondary | ICD-10-CM | POA: Diagnosis not present

## 2019-08-22 DIAGNOSIS — M792 Neuralgia and neuritis, unspecified: Secondary | ICD-10-CM | POA: Diagnosis not present

## 2019-08-22 DIAGNOSIS — G546 Phantom limb syndrome with pain: Secondary | ICD-10-CM | POA: Diagnosis not present

## 2019-08-22 DIAGNOSIS — Z23 Encounter for immunization: Secondary | ICD-10-CM

## 2019-08-22 DIAGNOSIS — M47816 Spondylosis without myelopathy or radiculopathy, lumbar region: Secondary | ICD-10-CM | POA: Diagnosis not present

## 2019-08-22 DIAGNOSIS — G894 Chronic pain syndrome: Secondary | ICD-10-CM | POA: Diagnosis not present

## 2019-08-22 NOTE — Progress Notes (Signed)
   [facility] Vaccination [facility]  Name:  RENA SWEEDEN Sr.    MRN: [id number] DOB: 03/08/1951  08/22/2019  Mr. Dennie was observed post Covid-19 immunization for 15 minutes without incident. He was provided with Vaccine Information Sheet and instruction to access the V-Safe system.   Mr. Sonnenberg was instructed to call 911 with any severe reactions post vaccine: [name] Difficulty breathing  . Swelling of face and throat  . A fast heartbeat  . A bad rash all over body  . Dizziness and weakness   Immunizations Administered    Name Date Dose VIS Date Route   Pfizer COVID-19 Vaccine 08/22/2019  9:33 AM 0.3 mL 05/02/2019 Intramuscular   Manufacturer: [place]   Lot: [id number]   [place]: [id number]

## 2019-08-25 ENCOUNTER — Ambulatory Visit (AMBULATORY_SURGERY_CENTER): Payer: Medicare HMO

## 2019-08-25 ENCOUNTER — Other Ambulatory Visit: Payer: Self-pay

## 2019-08-25 VITALS — Ht 73.0 in | Wt 300.0 lb

## 2019-08-25 DIAGNOSIS — R1319 Other dysphagia: Secondary | ICD-10-CM

## 2019-08-25 DIAGNOSIS — D508 Other iron deficiency anemias: Secondary | ICD-10-CM

## 2019-08-25 DIAGNOSIS — Z01818 Encounter for other preprocedural examination: Secondary | ICD-10-CM

## 2019-08-25 NOTE — Progress Notes (Signed)
No egg or soy allergy known to patient  No issues with past sedation with any surgeries  or procedures, no intubation problems  No diet pills per patient No home 02 use per patient   Pt is on blood thinners - Effient, hold for 5 days prior to procedure. LAST DOSE 09/02/19  Pt denies issues with constipation  No A fib or A flutter  EMMI video sent to pt's e mail   VIRTUAL PRE-VISIT    Due to the COVID-19 pandemic we are asking patients to follow these guidelines. Please only bring one care partner. Please be aware that your care partner may wait in the car in the parking lot or if they feel like they will be too hot to wait in the car, they may wait in the lobby on the 4th floor. All care partners are required to wear a mask the entire time (we do not have any that we can provide them), they need to practice social distancing, and we will do a Covid check for all patient's and care partners when you arrive. Also we will check their temperature and your temperature. If the care partner waits in their car they need to stay in the parking lot the entire time and we will call them on their cell phone when the patient is ready for discharge so they can bring the car to the front of the building. Also all patient's will need to wear a mask into building.

## 2019-08-27 DIAGNOSIS — M961 Postlaminectomy syndrome, not elsewhere classified: Secondary | ICD-10-CM | POA: Diagnosis not present

## 2019-08-27 DIAGNOSIS — G894 Chronic pain syndrome: Secondary | ICD-10-CM | POA: Diagnosis not present

## 2019-08-27 DIAGNOSIS — F3341 Major depressive disorder, recurrent, in partial remission: Secondary | ICD-10-CM | POA: Diagnosis not present

## 2019-08-30 DIAGNOSIS — G4733 Obstructive sleep apnea (adult) (pediatric): Secondary | ICD-10-CM | POA: Diagnosis not present

## 2019-09-01 ENCOUNTER — Other Ambulatory Visit: Payer: Self-pay | Admitting: Internal Medicine

## 2019-09-01 NOTE — Telephone Encounter (Signed)
Testosterone sent.    zpak refused - pt needs to contact provider

## 2019-09-01 NOTE — Telephone Encounter (Signed)
Check [place] registry last filled testosterone 08/05/2019. MD is out of the office pls advise on refill.Marland KitchenJohny Chess

## 2019-09-02 ENCOUNTER — Other Ambulatory Visit: Payer: Self-pay

## 2019-09-02 ENCOUNTER — Encounter: Payer: Medicare HMO | Attending: General Surgery | Admitting: Skilled Nursing Facility1

## 2019-09-02 DIAGNOSIS — G894 Chronic pain syndrome: Secondary | ICD-10-CM | POA: Insufficient documentation

## 2019-09-02 DIAGNOSIS — F419 Anxiety disorder, unspecified: Secondary | ICD-10-CM | POA: Insufficient documentation

## 2019-09-02 DIAGNOSIS — Z7902 Long term (current) use of antithrombotics/antiplatelets: Secondary | ICD-10-CM | POA: Insufficient documentation

## 2019-09-02 DIAGNOSIS — G4733 Obstructive sleep apnea (adult) (pediatric): Secondary | ICD-10-CM | POA: Diagnosis not present

## 2019-09-02 DIAGNOSIS — Z79899 Other long term (current) drug therapy: Secondary | ICD-10-CM | POA: Insufficient documentation

## 2019-09-02 DIAGNOSIS — E119 Type 2 diabetes mellitus without complications: Secondary | ICD-10-CM | POA: Diagnosis not present

## 2019-09-02 DIAGNOSIS — Z89511 Acquired absence of right leg below knee: Secondary | ICD-10-CM | POA: Diagnosis not present

## 2019-09-02 DIAGNOSIS — Z713 Dietary counseling and surveillance: Secondary | ICD-10-CM | POA: Diagnosis not present

## 2019-09-02 DIAGNOSIS — Z87891 Personal history of nicotine dependence: Secondary | ICD-10-CM | POA: Diagnosis not present

## 2019-09-02 DIAGNOSIS — I2510 Atherosclerotic heart disease of native coronary artery without angina pectoris: Secondary | ICD-10-CM | POA: Diagnosis not present

## 2019-09-02 DIAGNOSIS — Z7982 Long term (current) use of aspirin: Secondary | ICD-10-CM | POA: Diagnosis not present

## 2019-09-02 DIAGNOSIS — Z8261 Family history of arthritis: Secondary | ICD-10-CM | POA: Insufficient documentation

## 2019-09-02 DIAGNOSIS — I10 Essential (primary) hypertension: Secondary | ICD-10-CM | POA: Diagnosis not present

## 2019-09-02 DIAGNOSIS — Z6836 Body mass index (BMI) 36.0-36.9, adult: Secondary | ICD-10-CM | POA: Diagnosis not present

## 2019-09-02 DIAGNOSIS — Z7984 Long term (current) use of oral hypoglycemic drugs: Secondary | ICD-10-CM | POA: Insufficient documentation

## 2019-09-02 DIAGNOSIS — Z833 Family history of diabetes mellitus: Secondary | ICD-10-CM | POA: Diagnosis not present

## 2019-09-02 DIAGNOSIS — E669 Obesity, unspecified: Secondary | ICD-10-CM

## 2019-09-02 DIAGNOSIS — Z955 Presence of coronary angioplasty implant and graft: Secondary | ICD-10-CM | POA: Diagnosis not present

## 2019-09-02 DIAGNOSIS — Z8249 Family history of ischemic heart disease and other diseases of the circulatory system: Secondary | ICD-10-CM | POA: Diagnosis not present

## 2019-09-03 NOTE — Progress Notes (Signed)
Follow-up visit:  Post-Operative sleeve Surgery  Medical Nutrition Therapy:  Appt start time: 6:00pm end time:  7:00pm  Primary concerns today: Post-operative Bariatric Surgery Nutrition Management 6 Month Post-Op Class  Anthropometrics (*all weights include prosthesis)  Start weight at [facility]: 317.8 lbs (date: 01/17/2018)  Today's weight: 279lbs    Information Reviewed/ Discussed During Appointment: -Review of composition scale numbers -Fluid requirements (64-100 ounces) -Protein requirements (60-80g) -Strategies for tolerating diet -Advancement of diet to include Starchy vegetables -Barriers to inclusion of new foods -Inclusion of appropriate multivitamin and calcium supplements  -Exercise recommendations   Fluid intake: adequate   Medications: See List Supplementation: appropriate   Using straws: no Drinking while eating: no Having you been chewing well: yes Chewing/swallowing difficulties: no Changes in vision: no Changes to mood/headaches: no Hair loss/Cahnges to skin/Changes to nails: no Any difficulty focusing or concentrating: no Sweating: no Dizziness/Lightheaded: no Palpitations: no  Carbonated beverages: no N/V/D/C/GAS: no Abdominal Pain: no Dumping syndrome: no  Recent physical activity:  ADL's  Progress Towards Goal(s):  In Progress  Handouts given during visit include:  Phase V diet Progression   Goals Sheet  The Benefits of Exercise are endless.....  Support Group Topics  Pt Chosen Goals: Nutrition Goals: I will eat (a number) ___3___ new non-starchy vegetables every week by (specific date) __07/12/2021____________ I will eat 3 meals a day 5 days a week by (specific date) ____07/13/21_________ Mental Health and Well Being: I will say 2 nice things to and about myself 7 days a week by (specific date) ___07/12/2021___________  Teaching Method Utilized:  Visual Auditory Hands on  Demonstrated degree of understanding via:  Teach Back    Monitoring/Evaluation:  Dietary intake, exercise, and body weight. Follow up in 3 months for 9 month post-op visit.

## 2019-09-04 ENCOUNTER — Other Ambulatory Visit (HOSPITAL_COMMUNITY)
Admission: RE | Admit: 2019-09-04 | Discharge: 2019-09-04 | Disposition: A | Discharge status: Home / Self Care | Payer: Medicare HMO | Source: Ambulatory Visit | Attending: Gastroenterology | Admitting: Gastroenterology

## 2019-09-04 DIAGNOSIS — Z20822 Contact with and (suspected) exposure to covid-19: Secondary | ICD-10-CM | POA: Insufficient documentation

## 2019-09-04 DIAGNOSIS — Z01812 Encounter for preprocedural laboratory examination: Secondary | ICD-10-CM | POA: Insufficient documentation

## 2019-09-04 LAB — SARS CORONAVIRUS 2 (TAT 6-24 HRS): SARS Coronavirus 2: NEGATIVE

## 2019-09-08 ENCOUNTER — Other Ambulatory Visit (INDEPENDENT_AMBULATORY_CARE_PROVIDER_SITE_OTHER): Payer: Self-pay | Admitting: Specialist

## 2019-09-08 ENCOUNTER — Other Ambulatory Visit: Payer: Self-pay

## 2019-09-08 ENCOUNTER — Encounter: Payer: Self-pay | Admitting: Gastroenterology

## 2019-09-08 ENCOUNTER — Ambulatory Visit (AMBULATORY_SURGERY_CENTER): Payer: Medicare HMO | Admitting: Gastroenterology

## 2019-09-08 VITALS — BP 106/56 | HR 58 | Temp 97.5°F | Resp 14

## 2019-09-08 DIAGNOSIS — R1319 Other dysphagia: Secondary | ICD-10-CM

## 2019-09-08 DIAGNOSIS — D509 Iron deficiency anemia, unspecified: Secondary | ICD-10-CM

## 2019-09-08 DIAGNOSIS — K2950 Unspecified chronic gastritis without bleeding: Secondary | ICD-10-CM | POA: Diagnosis not present

## 2019-09-08 DIAGNOSIS — E119 Type 2 diabetes mellitus without complications: Secondary | ICD-10-CM | POA: Diagnosis not present

## 2019-09-08 DIAGNOSIS — R1310 Dysphagia, unspecified: Secondary | ICD-10-CM | POA: Diagnosis not present

## 2019-09-08 DIAGNOSIS — I2510 Atherosclerotic heart disease of native coronary artery without angina pectoris: Secondary | ICD-10-CM | POA: Diagnosis not present

## 2019-09-08 MED ORDER — PANTOPRAZOLE SODIUM 40 MG PO TBEC: 40.0000 mg | DELAYED_RELEASE_TABLET | Freq: Two times a day (BID) | ORAL | 2 refills | Status: DC

## 2019-09-08 MED ORDER — SODIUM CHLORIDE 0.9 % IV SOLN: 500.0000 mL | Freq: Once | INTRAVENOUS | Status: DC

## 2019-09-08 NOTE — Op Note (Signed)
[place] Patient Name: Travis [name] Procedure Date: 09/08/2019 10:22 AM MRN: [id number] Endoscopist: Remo Lipps P. Havery Moros , MD Age: 69 Referring MD:  Date of Birth: 1951-01-22 Gender: Male Account #: [id number] Procedure:                Upper GI endoscopy Indications:              Iron deficiency anemia, Dysphagia, on Effient and                            daily celebrex, colonoscopy 10/2018 without cause                            for anemia, on protonix 56m once daily Medicines:                Monitored Anesthesia Care Procedure:                Pre-Anesthesia Assessment:                           - Prior to the procedure, a History and Physical                            was performed, and patient medications and                            allergies were reviewed. The patient's tolerance of                            previous anesthesia was also reviewed. The risks                            and benefits of the procedure and the sedation                            options and risks were discussed with the patient.                            All questions were answered, and informed consent                            was obtained. Prior Anticoagulants: The patient has                            taken Effient (prasugrel), last dose was 5 days                            prior to procedure. ASA Grade Assessment: III - A                            patient with severe systemic disease. After                            reviewing the risks and benefits, the patient was  deemed in satisfactory condition to undergo the                            procedure.                           After obtaining informed consent, the endoscope was                            passed under direct vision. Throughout the                            procedure, the patient's blood pressure, pulse, and                            oxygen saturations were monitored  continuously. The                            Endoscope was introduced through the mouth, and                            advanced to the second part of duodenum. The upper                            GI endoscopy was accomplished without difficulty.                            The patient tolerated the procedure well. Scope In: Scope Out: Findings:                 Esophagogastric landmarks were identified: the                            Z-line was found at 41 cm, the gastroesophageal                            junction was found at 41 cm and the upper extent of                            the gastric folds was found at 41 cm from the                            incisors.                           The exam of the esophagus showed some suspected                            adherent ingested material vs. candidiasis, suspect                            the former, it was able to wash off. The esophagus  was otherwise normal, no obvious stenosis /                            stricture.                           A guidewire was placed and the scope was withdrawn.                            Empiric dilation was performed in the entire                            esophagus with a Savary dilator with mild                            resistance at 17 mm and 18 mm. Relook endoscopy                            showed no mucosal wrents. Biopsies were taken with                            a cold forceps in the upper third of the esophagus,                            in the middle third of the esophagus and in the                            lower third of the esophagus for histology.                           Evidence of a sleeve gastrectomy was found in the                            gastric fundus and in the gastric body.                           Diffuse inflammation characterized by erythema and                            friability was found in the gastric body and in the                             gastric antrum. No focal ulcers. Biopsies were                            taken with a cold forceps for Helicobacter pylori                            testing.                           The exam of the stomach was otherwise normal.  The duodenal bulb and second portion of the                            duodenum were normal. Complications:            No immediate complications. Estimated blood loss:                            Minimal. Estimated Blood Loss:     Estimated blood loss was minimal. Impression:               - Esophagogastric landmarks identified.                           - Whitish adherent material along the esophagus,                            mostly able to wash off, suspect ingested material                            vs. possible candidiasis - biopsies taken                           - No obvious stenosis / stricture, empiric dilation                            performed to 46m with good result.                           - A sleeve gastrectomy was found.                           - Gastritis. Biopsied to rule out H pylori                           - Normal duodenal bulb and second portion of the                            duodenum.                           Possible gastritis could be causing the patient's                            iron deficiency in the setting of Effient / NSAID                            use. Recommendation:           - Patient has a contact number available for                            emergencies. The signs and symptoms of potential                            delayed complications were discussed with the  patient. Return to normal activities tomorrow.                            Written discharge instructions were provided to the                            patient.                           - Resume previous diet.                           - Continue present medications.                            - Resume Effient today                           - Increase protonix to 66m twice daily                           - Continue iron supplementation                           - Would avoid NSAIDs / celebrex if possible,                            suspect this could be causing the gastritis / anemia                           - Await pathology results.                           - Trend Hgb, await response to oral iron SRemo LippsP. Jovan Colligan, MD 09/08/2019 10:51:37 AM This report has been signed electronically.

## 2019-09-08 NOTE — Patient Instructions (Signed)
Impression/Recommendations:  Gastritis Handout given to patient.  Resume previous diet. Continue present medications. Resume Effient today.  Increase Protonix to 40 mg. Twice daily. Continue iron supplementation.  Avoid NSAIDS/Celebrex if possible.  Await pathology results.  YOU HAD AN ENDOSCOPIC PROCEDURE TODAY AT [facility]:   Refer to the procedure report that was given to you for any specific questions about what was found during the examination.  If the procedure report does not answer your questions, please call your gastroenterologist to clarify.  If you requested that your care partner not be given the details of your procedure findings, then the procedure report has been included in a sealed envelope for you to review at your convenience later.  YOU SHOULD EXPECT: Some feelings of bloating in the abdomen. Passage of more gas than usual.  Walking can help get rid of the air that was put into your GI tract during the procedure and reduce the bloating. If you had a lower endoscopy (such as a colonoscopy or flexible sigmoidoscopy) you may notice spotting of blood in your stool or on the toilet paper. If you underwent a bowel prep for your procedure, you may not have a normal bowel movement for a few days.  Please Note:  You might notice some irritation and congestion in your nose or some drainage.  This is from the oxygen used during your procedure.  There is no need for concern and it should clear up in a day or so.  SYMPTOMS TO REPORT IMMEDIATELY:   Following upper endoscopy (EGD)  Vomiting of blood or coffee ground material  New chest pain or pain under the shoulder blades  Painful or persistently difficult swallowing  New shortness of breath  Fever of 100F or higher  Black, tarry-looking stools  For urgent or emergent issues, a gastroenterologist can be reached at any hour by calling [phone]. Do not use MyChart messaging for urgent concerns.     DIET:  We do recommend a small meal at first, but then you may proceed to your regular diet.  Drink plenty of fluids but you should avoid alcoholic beverages for 24 hours.  ACTIVITY:  You should plan to take it easy for the rest of today and you should NOT DRIVE or use heavy machinery until tomorrow (because of the sedation medicines used during the test).    FOLLOW UP: Our staff will call the number listed on your records 48-72 hours following your procedure to check on you and address any questions or concerns that you may have regarding the information given to you following your procedure. If we do not reach you, we will leave a message.  We will attempt to reach you two times.  During this call, we will ask if you have developed any symptoms of COVID 19. If you develop any symptoms (ie: fever, flu-like symptoms, shortness of breath, cough etc.) before then, please call [phone].  If you test positive for Covid 19 in the 2 weeks post procedure, please call and report this information to [place].    If any biopsies were taken you will be contacted by phone or by letter within the next 1-3 weeks.  Please call us at [phone] if you have not heard about the biopsies in 3 weeks.    SIGNATURES/CONFIDENTIALITY: You and/or your care partner have signed paperwork which will be entered into your electronic medical record.  These signatures attest to the fact that that the information above on your After  Visit Summary has been reviewed and is understood.  Full responsibility of the confidentiality of this discharge information lies with you and/or your care-partner.

## 2019-09-08 NOTE — Progress Notes (Signed)
Report given to PACU, vss 

## 2019-09-08 NOTE — Progress Notes (Signed)
Temp JB BP DT

## 2019-09-10 ENCOUNTER — Telehealth: Payer: Self-pay | Admitting: *Deleted

## 2019-09-10 NOTE — Telephone Encounter (Signed)
  Follow up Call-  Call back number 09/08/2019 11/19/2018  Post procedure Call Back phone  # [phone]  Permission to leave phone message Yes Yes  Some recent data might be hidden     Patient questions:  Do you have a fever, pain , or abdominal swelling? No. Pain Score  0 *  Have you tolerated food without any problems? Yes.    Have you been able to return to your normal activities? Yes.    Do you have any questions about your discharge instructions: Diet   No. Medications  No. Follow up visit  No.  Do you have questions or concerns about your Care? No.  Actions: * If pain score is 4 or above: No action needed, pain <4.  1. Have you developed a fever since your procedure? no  2.   Have you had an respiratory symptoms (SOB or cough) since your procedure? no  3.   Have you tested positive for COVID 19 since your procedure no  4.   Have you had any family members/close contacts diagnosed with the COVID 19 since your procedure?  no   If yes to any of these questions please route to Joylene John, RN and Erenest Rasher, RN

## 2019-09-10 NOTE — Telephone Encounter (Signed)
First follow up call attempt.  Message left to call if any questions or concerns.

## 2019-09-12 ENCOUNTER — Other Ambulatory Visit: Payer: Self-pay

## 2019-09-12 DIAGNOSIS — D509 Iron deficiency anemia, unspecified: Secondary | ICD-10-CM

## 2019-09-15 ENCOUNTER — Ambulatory Visit: Payer: Medicare HMO | Attending: Internal Medicine

## 2019-09-15 DIAGNOSIS — Z23 Encounter for immunization: Secondary | ICD-10-CM

## 2019-09-15 NOTE — Progress Notes (Signed)
   [id number]  Name:  Travis HOBACK Sr.    MRN: [id number] DOB: [birth year]  09/15/2019  Travis [name] was observed post Covid-19 immunization for 15 minutes without incident. He was provided with Vaccine Information Sheet and instruction to access the V-Safe system.   Travis [name] was instructed to call 911 with any severe reactions post vaccine: [name] Difficulty breathing  . Swelling of face and throat  . A fast heartbeat  . A bad rash all over body  . Dizziness and weakness   Immunizations Administered    Name Date Dose VIS Date Route   Pfizer COVID-19 Vaccine 09/15/2019  9:50 AM 0.3 mL 07/16/2018 Intramuscular   Manufacturer: [place]   Lot: [id number]   [place]: [id number]

## 2019-09-22 ENCOUNTER — Other Ambulatory Visit: Payer: Self-pay

## 2019-09-22 ENCOUNTER — Ambulatory Visit (INDEPENDENT_AMBULATORY_CARE_PROVIDER_SITE_OTHER): Payer: Medicare HMO

## 2019-09-22 ENCOUNTER — Encounter: Payer: Self-pay | Admitting: Orthopedic Surgery

## 2019-09-22 ENCOUNTER — Ambulatory Visit (INDEPENDENT_AMBULATORY_CARE_PROVIDER_SITE_OTHER): Payer: Medicare HMO | Admitting: Orthopedic Surgery

## 2019-09-22 VITALS — Ht 73.0 in | Wt 279.0 lb

## 2019-09-22 DIAGNOSIS — M25561 Pain in right knee: Secondary | ICD-10-CM

## 2019-09-22 DIAGNOSIS — G8929 Other chronic pain: Secondary | ICD-10-CM

## 2019-09-22 NOTE — Progress Notes (Signed)
Office Visit Note   Patient: Travis YIM Sr.           Date of Birth: 13-Feb-1951           MRN: [id number] Visit Date: 09/22/2019              Requested by: [name], MD [place],  [place] [id number] PCP: [name], MD  Chief Complaint  Patient presents with  . Right Knee - Pain      HPI: Patient is a 70 year old gentleman right transtibial amputee who has been having subsidence with his socket on the right and has been having pain over the medial aspect of the patella.  Pain with ambulation pain at night he states he is up to walking 3 miles a day.  Initial socket made by level 4 currently working with [employer].  Assessment & Plan: Visit Diagnoses:  1. Chronic pain of right knee     Plan: I anticipate the new socket should resolve the pain in the medial aspect of the patella and also should resolve the ulceration over the residual limb.  Both symptoms seem to be coming from subsidence into the socket.  Follow-Up Instructions: Return if symptoms worsen or fail to improve.   Ortho Exam  Patient is alert, oriented, no adenopathy, well-dressed, normal affect, normal respiratory effort. Examination patient is wearing over 15 ply sock examination of the residual limb he has an end bearing ulcer over the residual limb from subsidence.  Patient has tender over the medial aspect of the patella where he has been subsiding in the socket and causing pressure more proximally.  There is no effusion the patella tracks midline no skin breakdown proximally no signs of infection.  Patient ambulates with antalgic gait uses a cane in the right hand.  Imaging: XR Knee 1-2 Views Right  Result Date: 09/22/2019 2 view radiographs of the right knee shows stable total knee arthroplasty the patella is midline.  No evidence of lucency or fractures.  No images are attached to the encounter.  Labs: Lab Results  Component Value Date   HGBA1C 5.3 07/24/2019   HGBA1C 5.8 (A)  01/24/2019   HGBA1C 5.2 06/03/2018   ESRSEDRATE 22 (H) 09/03/2018   ESRSEDRATE 35 (H) 08/15/2018   ESRSEDRATE 20 (H) 09/03/2013   CRP 7.3 09/03/2018   CRP 14.1 (H) 08/15/2018   CRP 10.7 (H) 09/03/2013   REPTSTATUS 08/16/2018 FINAL 08/15/2018   GRAMSTAIN  09/02/2013    WBC PRESENT, PREDOMINANTLY MONONUCLEAR NO ORGANISMS SEEN CYTOSPIN Performed by [facility] Performed at [place]  09/02/2013    NO ORGANISMS SEEN WBC PRESENT, PREDOMINANTLY MONONUCLEAR CYTOSPIN Gram Stain Report Called to,Read Back By and Verified With: A LEDWELL RN 1931 09/02/13 A NAVARRO   CULT (A) 08/15/2018    <10,000 COLONIES/mL INSIGNIFICANT GROWTH Performed at [facility], [place] [address] [place], [place] [address]    LABORGA STAPHYLOCOCCUS SPECIES (COAGULASE NEGATIVE) 04/08/2015     Lab Results  Component Value Date   ALBUMIN 3.9 07/24/2019   ALBUMIN 3.8 03/04/2019   ALBUMIN 4.5 02/27/2019    Lab Results  Component Value Date   MG 1.5 (L) 08/15/2018   MG 1.9 05/03/2013   Lab Results  Component Value Date   VD25OH 24.67 (L) 07/24/2019    No results found for: PREALBUMIN CBC EXTENDED Latest Ref Rng & Units 07/24/2019 06/09/2019 06/08/2019  WBC 4.0 - 10.5 K/uL 5.6 6.3  7.6  RBC 4.22 - 5.81 Mil/uL 3.74(L) 3.08(L) 3.27(L)  HGB 13.0 - 17.0 g/dL 10.5(L) 9.3(L) 9.7(L)  HCT 39.0 - 52.0 % 32.5(L) 27.5(L) 29.3(L)  PLT 150.0 - 400.0 K/uL 293.0 252 227  NEUTROABS 1.4 - 7.7 K/uL 3.0 - -  LYMPHSABS 0.7 - 4.0 K/uL 1.5 - -     Body mass index is 36.81 kg/m.  Orders:  Orders Placed This Encounter  Procedures  . XR Knee 1-2 Views Right   No orders of the defined types were placed in this encounter.    Procedures: No procedures performed  Clinical Data: No additional findings.  ROS:  All other systems negative, except as noted in the HPI. Review of Systems  Objective: Vital Signs: Ht 6' 1"  (1.854 m)   Wt 279 lb (126.6 kg)   BMI 36.81 kg/m    Specialty Comments:  No specialty comments available.  PMFS History: Patient Active Problem List   Diagnosis Date Noted  . Cough 07/29/2019  . Wheezing 07/29/2019  . Possible exposure to STD 07/24/2019  . Unilateral primary osteoarthritis, left hip 06/06/2019  . Status post total replacement of left hip 06/06/2019  . S/P laparoscopic sleeve gastrectomy 03/03/2019  . Rash 01/24/2019  . Fever 08/15/2018  . UTI (urinary tract infection) 08/15/2018  . Fall at home, initial encounter 08/15/2018  . Chronic anemia 08/15/2018  . Hypokalemia 08/15/2018  . Bowel incontinence 07/25/2018  . Other spondylosis with radiculopathy, lumbar region 07/16/2018    Class: Chronic  . Status post lumbar laminectomy 07/16/2018  . Status post THR (total hip replacement) 04/17/2018  . Unilateral primary osteoarthritis, right hip   . Morbid obesity with BMI of 40.0-44.9, adult ([place]) 02/28/2018  . Myocardial infarction ([place]) 02/25/2018  . Coronary artery disease involving native coronary artery of native heart 02/25/2018  . PAD (peripheral artery disease) ([place]) 02/25/2018  . S/P BKA (below knee amputation) unilateral, right ([place]) 02/25/2018  . Sacroiliitis ([place]) 02/25/2018  . SOB (shortness of breath) 01/03/2018  . Acute on chronic heart failure ([place]) 01/03/2018  . Severe right groin pain 12/20/2017  . Morbid obesity ([place]) 10/09/2017  . Low back pain 07/13/2017  . Leukoplakia, tongue 01/26/2017  . Hyperglycemia 10/20/2016  . Skin lesion 10/20/2016  . S/P unilateral BKA (below knee amputation), right ([place]) 06/14/2016  . Charcot foot due to diabetes mellitus ([place])   . Charcot's arthropathy associated with type 2 diabetes mellitus ([place]) 04/11/2016  . Preventative health care 11/05/2015  . Major depression 09/13/2015  . S/P TKR (total knee replacement) bilaterally 09/13/2015  . GERD (gastroesophageal reflux disease) 09/08/2015  . S/P laparoscopic hernia repair 05/11/2015  . PPD positive 04/08/2015   . Benign neoplasm of descending colon   . Benign neoplasm of cecum   . Acute blood loss anemia   . Chronic anticoagulation   . Occult blood positive stool 10/17/2014  . General weakness 07/14/2014  . Urinary incontinence 07/14/2014  . Headache(784.0) 10/15/2013  . Spinal stenosis in cervical region 09/26/2013    Class: Chronic  . Spinal stenosis of lumbar region 09/26/2013    Class: Chronic  . Hand joint pain 06/10/2013  . Rotator cuff tear arthropathy of both shoulders 06/10/2013  . Skin lesion of cheek 05/01/2013  . Pain of right thumb 04/03/2013  . Balance disorder 03/12/2013  . Gait disorder 03/12/2013  . Tremor 03/12/2013  . Left hip pain 03/12/2013  . Pre-ulcerative corn or callous 02/06/2013  . Anxiety 11/12/2011  . OSA on CPAP 11/07/2011  .  Bradycardia 10/20/2011  . Insomnia 10/04/2011  . Obesity 01/12/2011  . Diabetes mellitus type 2 in obese ([place]) 09/27/2010  . ERECTILE DYSFUNCTION, ORGANIC 05/30/2010  . [place] DISEASE, LUMBAR 04/19/2010  . SCIATICA, LEFT 04/19/2010  . Chronic pain syndrome 10/27/2009  . Hyperlipidemia 07/15/2009  . Essential hypertension 06/24/2009  . Coronary artery disease involving native coronary artery of native heart without angina pectoris 06/24/2009  . Allergic rhinitis 06/24/2009  . URETHRAL STRICTURE 06/24/2009  . DEGENERATIVE JOINT DISEASE 06/24/2009  . SHOULDER PAIN, BILATERAL 06/24/2009  . FATIGUE 06/24/2009  . NEPHROLITHIASIS, HX OF 06/24/2009   Past Medical History:  Diagnosis Date  . ALLERGIC RHINITIS 06/24/2009  . Allergy   . Anemia    IDA  . Anxiety 11/12/2011   Adequate for discharge   . Arthritis    "all my joints" (09/30/2013)  . Arthritis of foot, right, degenerative 04/15/2014  . Balance disorder 03/12/2013  . Benign neoplasm of cecum   . Benign neoplasm of descending colon   . CAD (coronary artery disease) 06/24/2009   5 stents placed in 2007    . Chronic anticoagulation   . Chronic pain syndrome 10/27/2009   of  ankle, shoulders, low back.  sciatica.   . Closed fracture of right foot 10/17/2014  . CORONARY ARTERY DISEASE 06/24/2009   a. s/p multiple PCIs - In 2008 he had a Taxus DES to the mild LAD, Endeavor DES to mid LCX and distal LCX. In January 2009 he had DES to distal LCX, mid LCX and proximal LCX. In November 2009 had BMS x 2 to the mid RCA. Cath 10/2011 with patent stents, noncardiac CP. LHC 01/2013: patent stents (noncardiac CP).  . DEGENERATIVE JOINT DISEASE 06/24/2009   Qualifier: Diagnosis of  By: [name], [name]   . Depression   . Depression with anxiety    Prior suicide attempt(08/25/19-pt states not suicide attempt)  . [place] DISEASE, LUMBAR 04/19/2010  . ERECTILE DYSFUNCTION, ORGANIC 05/30/2010  . Essential hypertension 06/24/2009   Qualifier: Diagnosis of  By: [name], [name]   . Fibromyalgia   . Fracture dislocation of ankle joint 09/02/2015  . Gait disorder 03/12/2013  . General weakness 07/14/2014  . GERD (gastroesophageal reflux disease) 09/08/2015   08/25/15-pt states was cardiac origin, not GERD  . Hand joint pain 06/10/2013  . Heart murmur   . Hepatitis C    treated pt. unknown with what he was a teenager  . History of kidney stones   . Hyperlipidemia 07/15/2009   Qualifier: Diagnosis of  By: Aundra Dubin, MD, [place]    . HYPERLIPIDEMIA-MIXED 07/15/2009   08/25/19-pt state cholesterol was normal range  . HYPERTENSION 06/24/2009  . Insomnia 10/04/2011  . Irregular heart beat   . Left hip pain 03/12/2013   Injected under ultrasound guidance on June 24, 2013   . Major depression 09/13/2015  . Myocardial infarction ([place]) 2008  . Non-cardiac chest pain 10/2011, 01/2013  . Obesity   . Occult blood positive stool 10/17/2014  . Open ankle fracture 09/02/2015  . OSA (obstructive sleep apnea)    not using CPAP (09/30/2013)  . Pain of right thumb 04/03/2013  . Pneumonia   . PPD positive 04/08/2015  . Pre-ulcerative corn or callous 02/06/2013  . Rotator cuff tear arthropathy of both shoulders 06/10/2013    History of bilateral shoulder cuff surgery for rotator cuff tears. Reports increase in pain 09/11/2015 during physical therapy of the left shoulder.   [name] SCIATICA, LEFT 04/19/2010   Qualifier:  Diagnosis of  By: [name], [name]   . Sleep apnea    wears cpap 08/25/19-States not using due to nasal stuffiness.  [name] Spinal stenosis in cervical region 09/26/2013  . Spinal stenosis, lumbar region, with neurogenic claudication 09/26/2013  . Type II diabetes mellitus ([place]) Aug 08, 2010   no meds in 09/2014.   [name] Uncontrolled type 2 DM with peripheral circulatory disorder ([place]) 10/04/2013   08/25/19- pt states A1C normal, no diabetes  . URETHRAL STRICTURE 06/24/2009   self catheterizes.     Family History  Problem Relation Age of Onset  . Depression Mother   . Heart disease Mother   . Hypertension Mother   . Breast cancer Mother   . Cancer Mother   . Stomach cancer Mother   . Esophageal cancer Mother   . Pancreatic cancer Mother   . Diabetes Father   . Heart disease Father        CABG  . Hypertension Father   . Hyperlipidemia Father   . Prostate cancer Father   . Skin cancer Father   . Depression Brother        x 2  . Hypertension Brother        x2  . Colon polyps Brother   . Healthy Son   . Heart disease Maternal Grandfather   . Early death Maternal Grandfather   . Heart attack Maternal Grandfather August 09, 2063  . Early death Paternal Grandfather   . Coronary artery disease Other   . Hypertension Other   . Depression Other   . Healthy Son   . Colon cancer Neg Hx   . Rectal cancer Neg Hx     Past Surgical History:  Procedure Laterality Date  . AMPUTATION Right 06/14/2016   Procedure: AMPUTATION BELOW KNEE;  Surgeon: Newt Minion, MD;  Location: [facility];  Service: Orthopedics;  Laterality: Right;  . ANKLE FUSION Right 04/15/2014   Procedure: Right Subtalar, Talonavicular Fusion;  Surgeon: Newt Minion, MD;  Location: [facility];  Service: Orthopedics;  Laterality: Right;  . ANKLE FUSION Right 04/18/2016    Procedure: Right Ankle Tibiocalcaneal Fusion;  Surgeon: Newt Minion, MD;  Location: [facility];  Service: Orthopedics;  Laterality: Right;  . ANTERIOR CERVICAL DECOMP/DISCECTOMY FUSION N/A 09/26/2013   Procedure: ANTERIOR CERVICAL DISCECTOMY FUSION C3-4, plate and screw fixation, allograft bone graft;  Surgeon: Jessy Oto, MD;  Location: [facility];  Service: Orthopedics;  Laterality: N/A;  . BACK SURGERY     3  . BELOW KNEE LEG AMPUTATION Right 06/14/2016   right ankle and foot  . CARDIAC CATHETERIZATION  X 1  . CARPAL TUNNEL RELEASE Bilateral   . COLONOSCOPY N/A 10/22/2014   Procedure: COLONOSCOPY;  Surgeon: Lafayette Dragon, MD;  Location[facility];  Service: Endoscopy;  Laterality: N/A;  . COLONOSCOPY  11/19/2018  . CORONARY ANGIOPLASTY WITH STENT PLACEMENT     "I have 9 stents"  . ESOPHAGOGASTRODUODENOSCOPY N/A 10/19/2014   Procedure: ESOPHAGOGASTRODUODENOSCOPY (EGD);  Surgeon: Jerene Bears, MD;  Location: [facility] ENDOSCOPY;  Service: Endoscopy;  Laterality: N/A;  . FRACTURE SURGERY    . FUSION OF TALONAVICULAR JOINT Right 04/15/2014   dr Aneth Schlagel  . GASTRIC BYPASS  02/20/2019  . HERNIA REPAIR     umbilical  . INGUINAL HERNIA REPAIR Right 05/11/2015   Procedure: LAPAROSCOPIC REPAIR RIGHT  INGUINAL HERNIA;  Surgeon: Greer Pickerel, MD;  Location: [place];  Service: General;  Laterality: Right;  . INSERTION OF MESH Right 05/11/2015  Procedure: INSERTION OF MESH;  Surgeon: Greer Pickerel, MD;  Location: [place];  Service: General;  Laterality: Right;  . JOINT REPLACEMENT    . KNEE CARTILAGE SURGERY Right X 12   "~ 1/2 open; ~ 1/2 scopes"  . KNEE CARTILAGE SURGERY Left X 3   "3 scopes"  . LAPAROSCOPIC GASTRIC SLEEVE RESECTION N/A 03/03/2019   Procedure: LAPAROSCOPIC GASTRIC SLEEVE RESECTION, Upper Endo, ERAS Pathway;  Surgeon: Greer Pickerel, MD;  Location: [facility];  Service: General;  Laterality: N/A;  . LEFT HEART CATHETERIZATION WITH CORONARY ANGIOGRAM N/A 02/10/2013   Procedure: LEFT HEART CATHETERIZATION  WITH CORONARY ANGIOGRAM;  Surgeon: Burnell Blanks, MD;  Location[facility] CATH LAB;  Service: Cardiovascular;  Laterality: N/A;  . LUMBAR LAMINECTOMY N/A 07/16/2018   Procedure: LEFT L4-5 REDO PARTIAL LUMBAR HEMILAMINECTOMY WITH FORAMINOTOMY LEFT L4;  Surgeon: Jessy Oto, MD;  Location: [place];  Service: Orthopedics;  Laterality: N/A;  . LUMBAR LAMINECTOMY/DECOMPRESSION MICRODISCECTOMY N/A 01/27/2014   Procedure: CENTRAL LUMBAR LAMINECTOMY L4-5 AND L3-4;  Surgeon: Jessy Oto, MD;  Location: [place];  Service: Orthopedics;  Laterality: N/A;  . ORIF ANKLE FRACTURE Right 09/02/2015   Procedure: OPEN REDUCTION INTERNAL FIXATION (ORIF) ANKLE FRACTURE;  Surgeon: Newt Minion, MD;  Location: [place];  Service: Orthopedics;  Laterality: Right;  . PERIPHERALLY INSERTED CENTRAL CATHETER INSERTION  09/02/2015  . POLYPECTOMY    . SHOULDER ARTHROSCOPY W/ ROTATOR CUFF REPAIR Bilateral    "3 on the right; 1 on the left"  . SKIN SPLIT GRAFT Right 10/01/2015   Procedure: RIGHT ANKLE APPLY SKIN GRAFT SPLIT THICKNESS;  Surgeon: Newt Minion, MD;  Location: [place];  Service: Orthopedics;  Laterality: Right;  . TONSILLECTOMY    . TOOTH EXTRACTION    . TOTAL HIP ARTHROPLASTY Right 04/17/2018  . TOTAL HIP ARTHROPLASTY Right 04/17/2018   Procedure: RIGHT TOTAL HIP ARTHROPLASTY;  Surgeon: Newt Minion, MD;  Location: [place];  Service: Orthopedics;  Laterality: Right;  . TOTAL HIP ARTHROPLASTY Left 06/06/2019   Procedure: LEFT TOTAL HIP ARTHROPLASTY ANTERIOR APPROACH;  Surgeon: Mcarthur Rossetti, MD;  Location: [place];  Service: Orthopedics;  Laterality: Left;  . TOTAL HIP REVISION Left 05/24/2019  . TOTAL KNEE ARTHROPLASTY Bilateral 2008  . UMBILICAL HERNIA REPAIR     UHR  . UPPER GASTROINTESTINAL ENDOSCOPY  2016  . URETHRAL DILATION  X 4  . VASECTOMY    . WISDOM TOOTH EXTRACTION     Social History   Occupational History  . Occupation: disabled since 2006 due to ortho. heart, psych    Employer:  UNEMPLOYED  . Occupation: part time work as an Multimedia programmer, wrestling, and Holiday representative  Tobacco Use  . Smoking status: Former Smoker    Types: Cigars    Quit date: 08/28/2010    Years since quitting: 9.0  . Smokeless tobacco: Never Used  . Tobacco comment: 04/18/2016 "smoked 1 cigar/wk when I did smoke"  Substance and Sexual Activity  . Alcohol use: Not Currently    Alcohol/week: 0.0 standard drinks    Comment: rarely  . Drug use: No  . Sexual activity: Not Currently

## 2019-10-03 ENCOUNTER — Other Ambulatory Visit: Payer: Self-pay | Admitting: Internal Medicine

## 2019-10-06 ENCOUNTER — Other Ambulatory Visit: Payer: Self-pay | Admitting: Internal Medicine

## 2019-10-06 NOTE — Telephone Encounter (Signed)
Please refill as per office routine med refill policy (all routine meds refilled for 3 mo or monthly per pt preference up to one year from last visit, then month to month grace period for 3 mo, then further med refills will have to be denied)  

## 2019-10-09 ENCOUNTER — Encounter (HOSPITAL_COMMUNITY): Payer: Self-pay | Admitting: *Deleted

## 2019-10-09 ENCOUNTER — Emergency Department (HOSPITAL_COMMUNITY): Payer: Medicare HMO

## 2019-10-09 ENCOUNTER — Emergency Department (HOSPITAL_COMMUNITY)
Admission: EM | Admit: 2019-10-09 | Discharge: 2019-10-09 | Disposition: A | Discharge status: Home / Self Care | Payer: Medicare HMO | Attending: Emergency Medicine | Admitting: Emergency Medicine

## 2019-10-09 DIAGNOSIS — Y929 Unspecified place or not applicable: Secondary | ICD-10-CM | POA: Diagnosis not present

## 2019-10-09 DIAGNOSIS — Z87891 Personal history of nicotine dependence: Secondary | ICD-10-CM | POA: Insufficient documentation

## 2019-10-09 DIAGNOSIS — S5001XA Contusion of right elbow, initial encounter: Secondary | ICD-10-CM | POA: Insufficient documentation

## 2019-10-09 DIAGNOSIS — T148XXA Other injury of unspecified body region, initial encounter: Secondary | ICD-10-CM

## 2019-10-09 DIAGNOSIS — Z7984 Long term (current) use of oral hypoglycemic drugs: Secondary | ICD-10-CM | POA: Insufficient documentation

## 2019-10-09 DIAGNOSIS — Z79899 Other long term (current) drug therapy: Secondary | ICD-10-CM | POA: Insufficient documentation

## 2019-10-09 DIAGNOSIS — Z96653 Presence of artificial knee joint, bilateral: Secondary | ICD-10-CM | POA: Diagnosis not present

## 2019-10-09 DIAGNOSIS — E119 Type 2 diabetes mellitus without complications: Secondary | ICD-10-CM | POA: Diagnosis not present

## 2019-10-09 DIAGNOSIS — W010XXA Fall on same level from slipping, tripping and stumbling without subsequent striking against object, initial encounter: Secondary | ICD-10-CM | POA: Insufficient documentation

## 2019-10-09 DIAGNOSIS — I10 Essential (primary) hypertension: Secondary | ICD-10-CM | POA: Insufficient documentation

## 2019-10-09 DIAGNOSIS — Y999 Unspecified external cause status: Secondary | ICD-10-CM | POA: Insufficient documentation

## 2019-10-09 DIAGNOSIS — S59901A Unspecified injury of right elbow, initial encounter: Secondary | ICD-10-CM | POA: Diagnosis not present

## 2019-10-09 DIAGNOSIS — I2510 Atherosclerotic heart disease of native coronary artery without angina pectoris: Secondary | ICD-10-CM | POA: Diagnosis not present

## 2019-10-09 DIAGNOSIS — Z96643 Presence of artificial hip joint, bilateral: Secondary | ICD-10-CM | POA: Insufficient documentation

## 2019-10-09 DIAGNOSIS — S40021A Contusion of right upper arm, initial encounter: Secondary | ICD-10-CM | POA: Diagnosis not present

## 2019-10-09 DIAGNOSIS — Y939 Activity, unspecified: Secondary | ICD-10-CM | POA: Diagnosis not present

## 2019-10-09 DIAGNOSIS — M7989 Other specified soft tissue disorders: Secondary | ICD-10-CM | POA: Diagnosis not present

## 2019-10-09 MED ORDER — HYDROCODONE-ACETAMINOPHEN 5-325 MG PO TABS
2.0000 | ORAL_TABLET | Freq: Once | ORAL | Status: DC
  Filled 2019-10-09: qty 2

## 2019-10-09 NOTE — ED Notes (Signed)
Patient Alert and oriented to baseline. Stable and ambulatory to baseline. Patient verbalized understanding of the discharge instructions.  Patient belongings were taken by the patient.   

## 2019-10-09 NOTE — ED Triage Notes (Signed)
To ED for eval of right elbow pain and swelling since falling yesterday. States he was home from a 2.5 mile walk and right prosthetic leg slipped off causing pt to fall- landing on elbow. Pt able to straighten right arm with pain. Right radial pulses palpable and strong. Swelling and bruising noted to right elbow area. No further injury

## 2019-10-09 NOTE — ED Provider Notes (Signed)
[facility] EMERGENCY DEPARTMENT Provider Note   CSN: [id number] Arrival date & time: 10/09/19  0815     History Chief Complaint  Patient presents with  . Elbow Pain    Travis [name]. is a 69 y.o. male.  HPI Patient was out walking for exercise yesterday.  He has a prosthetic limb on the right.  He reports that it slipped off due to becoming sweaty and loose.  This caused him to fall landing on the right elbow.  He denies any other injury besides the right elbow.  He does not have thoracic pain.  Did not strike his head.  Does not have back pain.  He reports that since yesterday the pain and swelling in the elbow has increased significantly.  He did take his home prescribed hydrocodone before coming to the emergency department and it is helping with pain.  There is no associated numbness tingling or weakness of the hand or arm.  Patient does take oral blood thinner.    Past Medical History:  Diagnosis Date  . ALLERGIC RHINITIS 06/24/2009  . Allergy   . Anemia    IDA  . Anxiety 11/12/2011   Adequate for discharge   . Arthritis    "all my joints" (09/30/2013)  . Arthritis of foot, right, degenerative 04/15/2014  . Balance disorder 03/12/2013  . Benign neoplasm of cecum   . Benign neoplasm of descending colon   . CAD (coronary artery disease) 06/24/2009   5 stents placed in 2007    . Chronic anticoagulation   . Chronic pain syndrome 10/27/2009   of ankle, shoulders, low back.  sciatica.   . Closed fracture of right foot 10/17/2014  . CORONARY ARTERY DISEASE 06/24/2009   a. s/p multiple PCIs - In 2008 he had a Taxus DES to the mild LAD, Endeavor DES to mid LCX and distal LCX. In January 2009 he had DES to distal LCX, mid LCX and proximal LCX. In November 2009 had BMS x 2 to the mid RCA. Cath 10/2011 with patent stents, noncardiac CP. LHC 01/2013: patent stents (noncardiac CP).  . DEGENERATIVE JOINT DISEASE 06/24/2009   Qualifier: Diagnosis of  By: [name], [name]     . Depression   . Depression with anxiety    Prior suicide attempt(08/25/19-pt states not suicide attempt)  . [place] DISEASE, LUMBAR 04/19/2010  . ERECTILE DYSFUNCTION, ORGANIC 05/30/2010  . Essential hypertension 06/24/2009   Qualifier: Diagnosis of  By: [name], [name]   . Fibromyalgia   . Fracture dislocation of ankle joint 09/02/2015  . Gait disorder 03/12/2013  . General weakness 07/14/2014  . GERD (gastroesophageal reflux disease) 09/08/2015   08/25/15-pt states was cardiac origin, not GERD  . Hand joint pain 06/10/2013  . Heart murmur   . Hepatitis C    treated pt. unknown with what he was a teenager  . History of kidney stones   . Hyperlipidemia 07/15/2009   Qualifier: Diagnosis of  By: Aundra Dubin, MD, [place]    . HYPERLIPIDEMIA-MIXED 07/15/2009   08/25/19-pt state cholesterol was normal range  . HYPERTENSION 06/24/2009  . Insomnia 10/04/2011  . Irregular heart beat   . Left hip pain 03/12/2013   Injected under ultrasound guidance on June 24, 2013   . Major depression 09/13/2015  . Myocardial infarction ([place]) 2008  . Non-cardiac chest pain 10/2011, 01/2013  . Obesity   . Occult blood positive stool 10/17/2014  . Open ankle fracture 09/02/2015  . OSA (obstructive  sleep apnea)    not using CPAP (09/30/2013)  . Pain of right thumb 04/03/2013  . Pneumonia   . PPD positive 04/08/2015  . Pre-ulcerative corn or callous 02/06/2013  . Rotator cuff tear arthropathy of both shoulders 06/10/2013   History of bilateral shoulder cuff surgery for rotator cuff tears. Reports increase in pain 09/11/2015 during physical therapy of the left shoulder.   [name] SCIATICA, LEFT 04/19/2010   Qualifier: Diagnosis of  By: [name], [name]   . Sleep apnea    wears cpap 08/25/19-States not using due to nasal stuffiness.  [name] Spinal stenosis in cervical region 09/26/2013  . Spinal stenosis, lumbar region, with neurogenic claudication 09/26/2013  . Type II diabetes mellitus ([place]) 2012   no meds in 09/2014.   [name] Uncontrolled type 2  DM with peripheral circulatory disorder ([place]) 10/04/2013   08/25/19- pt states A1C normal, no diabetes  . URETHRAL STRICTURE 06/24/2009   self catheterizes.     Patient Active Problem List   Diagnosis Date Noted  . Cough 07/29/2019  . Wheezing 07/29/2019  . Possible exposure to STD 07/24/2019  . Unilateral primary osteoarthritis, left hip 06/06/2019  . Status post total replacement of left hip 06/06/2019  . S/P laparoscopic sleeve gastrectomy 03/03/2019  . Rash 01/24/2019  . Fever 08/15/2018  . UTI (urinary tract infection) 08/15/2018  . Fall at home, initial encounter 08/15/2018  . Chronic anemia 08/15/2018  . Hypokalemia 08/15/2018  . Bowel incontinence 07/25/2018  . Other spondylosis with radiculopathy, lumbar region 07/16/2018    Class: Chronic  . Status post lumbar laminectomy 07/16/2018  . Status post THR (total hip replacement) 04/17/2018  . Unilateral primary osteoarthritis, right hip   . Morbid obesity with BMI of 40.0-44.9, adult ([place]) 02/28/2018  . Myocardial infarction ([place]) 02/25/2018  . Coronary artery disease involving native coronary artery of native heart 02/25/2018  . PAD (peripheral artery disease) ([place]) 02/25/2018  . S/P BKA (below knee amputation) unilateral, right ([place]) 02/25/2018  . Sacroiliitis ([place]) 02/25/2018  . SOB (shortness of breath) 01/03/2018  . Acute on chronic heart failure ([place]) 01/03/2018  . Severe right groin pain 12/20/2017  . Morbid obesity ([place]) 10/09/2017  . Low back pain 07/13/2017  . Leukoplakia, tongue 01/26/2017  . Hyperglycemia 10/20/2016  . Skin lesion 10/20/2016  . S/P unilateral BKA (below knee amputation), right ([place]) 06/14/2016  . Charcot foot due to diabetes mellitus ([place])   . Charcot's arthropathy associated with type 2 diabetes mellitus ([place]) 04/11/2016  . Preventative health care 11/05/2015  . Major depression 09/13/2015  . S/P TKR (total knee replacement) bilaterally 09/13/2015  . GERD (gastroesophageal reflux disease)  09/08/2015  . S/P laparoscopic hernia repair 05/11/2015  . PPD positive 04/08/2015  . Benign neoplasm of descending colon   . Benign neoplasm of cecum   . Acute blood loss anemia   . Chronic anticoagulation   . Occult blood positive stool 10/17/2014  . General weakness 07/14/2014  . Urinary incontinence 07/14/2014  . Headache(784.0) 10/15/2013  . Spinal stenosis in cervical region 09/26/2013    Class: Chronic  . Spinal stenosis of lumbar region 09/26/2013    Class: Chronic  . Hand joint pain 06/10/2013  . Rotator cuff tear arthropathy of both shoulders 06/10/2013  . Skin lesion of cheek 05/01/2013  . Pain of right thumb 04/03/2013  . Balance disorder 03/12/2013  . Gait disorder 03/12/2013  . Tremor 03/12/2013  . Left hip pain 03/12/2013  . Pre-ulcerative corn or callous 02/06/2013  .  Anxiety 11/12/2011  . OSA on CPAP 11/07/2011  . Bradycardia 10/20/2011  . Insomnia 10/04/2011  . Obesity 01/12/2011  . Diabetes mellitus type 2 in obese ([place]) 09/27/2010  . ERECTILE DYSFUNCTION, ORGANIC 05/30/2010  . [place] DISEASE, LUMBAR 04/19/2010  . SCIATICA, LEFT 04/19/2010  . Chronic pain syndrome 10/27/2009  . Hyperlipidemia 07/15/2009  . Essential hypertension 06/24/2009  . Coronary artery disease involving native coronary artery of native heart without angina pectoris 06/24/2009  . Allergic rhinitis 06/24/2009  . URETHRAL STRICTURE 06/24/2009  . DEGENERATIVE JOINT DISEASE 06/24/2009  . SHOULDER PAIN, BILATERAL 06/24/2009  . FATIGUE 06/24/2009  . NEPHROLITHIASIS, HX OF 06/24/2009    Past Surgical History:  Procedure Laterality Date  . AMPUTATION Right 06/14/2016   Procedure: AMPUTATION BELOW KNEE;  Surgeon: Newt Minion, MD;  Location: [facility];  Service: Orthopedics;  Laterality: Right;  . ANKLE FUSION Right 04/15/2014   Procedure: Right Subtalar, Talonavicular Fusion;  Surgeon: Newt Minion, MD;  Location: [place] [place];  Service: Orthopedics;  Laterality: Right;  . ANKLE FUSION Right  04/18/2016   Procedure: Right Ankle Tibiocalcaneal Fusion;  Surgeon: Newt Minion, MD;  Location: [place];  Service: Orthopedics;  Laterality: Right;  . ANTERIOR CERVICAL DECOMP/DISCECTOMY FUSION N/A 09/26/2013   Procedure: ANTERIOR CERVICAL DISCECTOMY FUSION C3-4, plate and screw fixation, allograft bone graft;  Surgeon: Jessy Oto, MD;  Location: [facility];  Service: Orthopedics;  Laterality: N/A;  . BACK SURGERY     3  . BELOW KNEE LEG AMPUTATION Right 06/14/2016   right ankle and foot  . CARDIAC CATHETERIZATION  X 1  . CARPAL TUNNEL RELEASE Bilateral   . COLONOSCOPY N/A 10/22/2014   Procedure: COLONOSCOPY;  Surgeon: Lafayette Dragon, MD;  Location: [facility] ENDOSCOPY;  Service: Endoscopy;  Laterality: N/A;  . COLONOSCOPY  11/19/2018  . CORONARY ANGIOPLASTY WITH STENT PLACEMENT     "I have 9 stents"  . ESOPHAGOGASTRODUODENOSCOPY N/A 10/19/2014   Procedure: ESOPHAGOGASTRODUODENOSCOPY (EGD);  Surgeon: Jerene Bears, MD;  Location: [facility] ENDOSCOPY;  Service: Endoscopy;  Laterality: N/A;  . FRACTURE SURGERY    . FUSION OF TALONAVICULAR JOINT Right 04/15/2014   dr duda  . GASTRIC BYPASS  02/20/2019  . HERNIA REPAIR     umbilical  . INGUINAL HERNIA REPAIR Right 05/11/2015   Procedure: LAPAROSCOPIC REPAIR RIGHT  INGUINAL HERNIA;  Surgeon: Greer Pickerel, MD;  Location: [facility] [place];  Service: General;  Laterality: Right;  . INSERTION OF MESH Right 05/11/2015   Procedure: INSERTION OF MESH;  Surgeon: Greer Pickerel, MD;  Location: [facility];  Service: General;  Laterality: Right;  . JOINT REPLACEMENT    . KNEE CARTILAGE SURGERY Right X 12   "~ 1/2 open; ~ 1/2 scopes"  . KNEE CARTILAGE SURGERY Left X 3   "3 scopes"  . LAPAROSCOPIC GASTRIC SLEEVE RESECTION N/A 03/03/2019   Procedure: LAPAROSCOPIC GASTRIC SLEEVE RESECTION, Upper Endo, ERAS Pathway;  Surgeon: Greer Pickerel, MD;  Location: [facility];  Service: General;  Laterality: N/A;  . LEFT HEART CATHETERIZATION WITH CORONARY ANGIOGRAM N/A 02/10/2013   Procedure: LEFT HEART  CATHETERIZATION WITH CORONARY ANGIOGRAM;  Surgeon: Burnell Blanks, MD;  Location: [facility] CATH LAB;  Service: Cardiovascular;  Laterality: N/A;  . LUMBAR LAMINECTOMY N/A 07/16/2018   Procedure: LEFT L4-5 REDO PARTIAL LUMBAR HEMILAMINECTOMY WITH FORAMINOTOMY LEFT L4;  Surgeon: Jessy Oto, MD;  Location: [place];  Service: Orthopedics;  Laterality: N/A;  . LUMBAR LAMINECTOMY/DECOMPRESSION MICRODISCECTOMY N/A 01/27/2014   Procedure: CENTRAL LUMBAR LAMINECTOMY L4-5 AND  L3-4;  Surgeon: Jessy Oto, MD;  Location: [place];  Service: Orthopedics;  Laterality: N/A;  . ORIF ANKLE FRACTURE Right 09/02/2015   Procedure: OPEN REDUCTION INTERNAL FIXATION (ORIF) ANKLE FRACTURE;  Surgeon: Newt Minion, MD;  Location: [place];  Service: Orthopedics;  Laterality: Right;  . PERIPHERALLY INSERTED CENTRAL CATHETER INSERTION  09/02/2015  . POLYPECTOMY    . SHOULDER ARTHROSCOPY W/ ROTATOR CUFF REPAIR Bilateral    "3 on the right; 1 on the left"  . SKIN SPLIT GRAFT Right 10/01/2015   Procedure: RIGHT ANKLE APPLY SKIN GRAFT SPLIT THICKNESS;  Surgeon: Newt Minion, MD;  Location: [facility];  Service: Orthopedics;  Laterality: Right;  . TONSILLECTOMY    . TOOTH EXTRACTION    . TOTAL HIP ARTHROPLASTY Right 04/17/2018  . TOTAL HIP ARTHROPLASTY Right 04/17/2018   Procedure: RIGHT TOTAL HIP ARTHROPLASTY;  Surgeon: Newt Minion, MD;  Location: [place];  Service: Orthopedics;  Laterality: Right;  . TOTAL HIP ARTHROPLASTY Left 06/06/2019   Procedure: LEFT TOTAL HIP ARTHROPLASTY ANTERIOR APPROACH;  Surgeon: Mcarthur Rossetti, MD;  Location: [facility];  Service: Orthopedics;  Laterality: Left;  . TOTAL HIP REVISION Left 05/24/2019  . TOTAL KNEE ARTHROPLASTY Bilateral Aug 10, 2006  . UMBILICAL HERNIA REPAIR     UHR  . UPPER GASTROINTESTINAL ENDOSCOPY  08/10/14  . URETHRAL DILATION  X 4  . VASECTOMY    . WISDOM TOOTH EXTRACTION         Family History  Problem Relation Age of Onset  . Depression Mother   . Heart disease Mother   .  Hypertension Mother   . Breast cancer Mother   . Cancer Mother   . Stomach cancer Mother   . Esophageal cancer Mother   . Pancreatic cancer Mother   . Diabetes Father   . Heart disease Father        CABG  . Hypertension Father   . Hyperlipidemia Father   . Prostate cancer Father   . Skin cancer Father   . Depression Brother        x 2  . Hypertension Brother        x2  . Colon polyps Brother   . Healthy Son   . Heart disease Maternal Grandfather   . Early death Maternal Grandfather   . Heart attack Maternal Grandfather Aug 11, 2063  . Early death Paternal Grandfather   . Coronary artery disease Other   . Hypertension Other   . Depression Other   . Healthy Son   . Colon cancer Neg Hx   . Rectal cancer Neg Hx     Social History   Tobacco Use  . Smoking status: Former Smoker    Types: Cigars    Quit date: 08/28/2010    Years since quitting: 9.1  . Smokeless tobacco: Never Used  . Tobacco comment: 04/18/2016 "smoked 1 cigar/wk when I did smoke"  Substance Use Topics  . Alcohol use: Not Currently    Alcohol/week: 0.0 standard drinks    Comment: rarely  . Drug use: No    Home Medications Prior to Admission medications   Medication Sig Start Date End Date Taking? Authorizing Provider  albuterol (VENTOLIN HFA) 108 (90 Base) MCG/ACT inhaler Inhale 2 puffs into the lungs every 6 (six) hours as needed for wheezing or shortness of breath. 07/29/19   [name], MD  amoxicillin (AMOXIL) 500 MG capsule daily as needed. AS NEEDED FOR DENTAL WORK 07/14/19   [provider]  ARIPiprazole (ABILIFY) 5 MG tablet Take 1 tablet (5 mg total) by mouth at bedtime. 08/18/19   Arfeen, Arlyce Harman, MD  aspirin 81 MG chewable tablet Chew 1 tablet (81 mg total) by mouth 2 (two) times daily. 06/10/19   Pete Pelt, PA-C  atorvastatin (LIPITOR) 20 MG tablet Take 20 mg by mouth daily.    [provider]  benztropine (COGENTIN) 0.5 MG tablet Take 1 tablet (0.5 mg total) by mouth at bedtime.  08/18/19 08/17/20  Arfeen, Arlyce Harman, MD  calcium carbonate (TUMS - DOSED IN MG ELEMENTAL CALCIUM) 500 MG chewable tablet Chew 1,000 mg by mouth at bedtime.    [provider]  Calcium Polycarbophil (FIBER-CAPS PO) Take 3 capsules by mouth at bedtime.    [provider]  celecoxib (CELEBREX) 200 MG capsule Take 1 capsule (200 mg total) by mouth 2 (two) times daily. 08/11/19   Jessy Oto, MD  clonazePAM (KLONOPIN) 0.5 MG tablet 0.5 mg daily. 1/2 TABLET THEN WILL FINISH RX AND D/C MED. 08/04/19   [name], MD  cyclobenzaprine (FLEXERIL) 5 MG tablet Take 5 mg by mouth 3 (three) times daily as needed. MUSCLE SPASMS    [provider]  diclofenac Sodium (VOLTAREN) 1 % GEL Apply 4 g topically 3 (three) times daily as needed (pain). 09/08/19   Jessy Oto, MD  DULoxetine (CYMBALTA) 30 MG capsule Take 1 capsule (30 mg total) by mouth 2 (two) times daily. 08/18/19   Arfeen, Arlyce Harman, MD  gabapentin (NEURONTIN) 800 MG tablet Take 800 mg by mouth 3 (three) times daily.  09/25/18   [provider]  hydrochlorothiazide (HYDRODIURIL) 25 MG tablet Take 1 tablet (25 mg total) by mouth daily. 06/12/19   Burnell Blanks, MD  HYDROcodone-acetaminophen (NORCO) 10-325 MG tablet Take 1 tablet by mouth every 8 (eight) hours as needed for moderate pain.  08/27/19   [provider]  iron polysaccharides (NU-IRON) 150 MG capsule Take 1 capsule (150 mg total) by mouth daily. 07/24/19   [name], MD  Melatonin 5 MG TABS Take 20 mg by mouth at bedtime.  12/31/17   [provider]  methocarbamol (ROBAXIN) 500 MG tablet Take 1 tablet (500 mg total) by mouth every 8 (eight) hours as needed for muscle spasms. 10/03/19   [name], MD  Multiple Vitamins-Minerals (BARIATRIC MULTIVITAMINS/IRON PO) Take 1 tablet by mouth daily.    [provider]  mupirocin ointment (BACTROBAN) 2 % Place 1 application into the nose 2 (two) times daily. 07/02/19   Pete Pelt,  PA-C  MYRBETRIQ 50 MG TB24 tablet Take 50 mg by mouth at bedtime.  02/03/19   [provider]  Omega-3 Fatty Acids (FISH OIL) 1000 MG CAPS Take 1,000 mg by mouth at bedtime.     [provider]  oxybutynin (DITROPAN-XL) 5 MG 24 hr tablet Take 5 mg by mouth at bedtime.    [provider]  oxyCODONE (OXY IR/ROXICODONE) 5 MG immediate release tablet Take 1-2 tablets (5-10 mg total) by mouth every 4 (four) hours as needed for moderate pain (pain score 4-6). 06/10/19   Pete Pelt, PA-C  oxymetazoline (AFRIN) 0.05 % nasal spray Place 1 spray into both nostrils 2 (two) times daily as needed for congestion.    [provider]  Pancrelipase, Lip-Prot-Amyl, (ZENPEP) 15000-47000 units CPEP Take 2 capsules with meals and 1 capsule with snacks    [provider]  pantoprazole (PROTONIX) 40 MG tablet  TAKE ONE TABLET BY MOUTH EVERY DAY 10/06/19   [name], MD  prasugrel (EFFIENT) 5 MG TABS tablet Take 1 tablet (5 mg total) by mouth daily. 08/11/19   Richardson Dopp T, PA-C  sildenafil (VIAGRA) 100 MG tablet Take 100 mg by mouth daily as needed for erectile dysfunction.  10/24/18   [provider]  Testosterone 1.62 % GEL Place 1 Pump onto the skin daily. 09/01/19   Binnie Rail, MD  traZODone (DESYREL) 150 MG tablet Take 1 tablet (150 mg total) by mouth at bedtime. 08/18/19   Arfeen, Arlyce Harman, MD  Vitamin D, Ergocalciferol, (DRISDOL) 1.25 MG (50000 UNIT) CAPS capsule Take 1 capsule (50,000 Units total) by mouth every 7 (seven) days. 07/24/19   [name], MD    Allergies    Patient has no known allergies.  Review of Systems   Review of Systems Constitutional: No fever no chills no malaise Respiratory: No shortness of breath no cough. Cardiovascular: No chest pain. Neurologic no weakness numbness or paresthesia of extremities. Physical Exam Updated Vital Signs BP (!) 113/97 (BP Location: Right Arm)   Pulse 66   Temp 99 F (37.2 C) (Oral)   Resp 14    Ht 6' 1"  (1.854 m)   Wt 122.5 kg   SpO2 98%   BMI 35.62 kg/m   Physical Exam Constitutional:      Comments: Alert and appropriate.  Nontoxic.  No respiratory distress.  HENT:     Head: Normocephalic and atraumatic.  Eyes:     Extraocular Movements: Extraocular movements intact.  Pulmonary:     Effort: Pulmonary effort is normal.  Musculoskeletal:     Comments: Patient has large and diffuse ecchymosis of the upper right arm in the medial soft tissues.  Approximately 15 x 10 cm diffuse.  The elbow itself does not have a significant effusion and ecchymosis and swelling is not present over the olecranon.  Range of motion of the elbow is intact.  Patient reports he has restricted range of motion of the shoulder at baseline due to multiple old rotator cuff injuries.  Forearm soft and nontender.  Radial pulse 2+.  Grip strength good.  Skin:    General: Skin is warm and dry.  Neurological:     General: No focal deficit present.     Mental Status: He is oriented to person, place, and time.     Coordination: Coordination normal.  Psychiatric:        Mood and Affect: Mood normal.     ED Results / Procedures / Treatments   Labs (all labs ordered are listed, but only abnormal results are displayed) Labs Reviewed - No data to display  EKG None  Radiology DG Elbow Complete Right  Result Date: 10/09/2019 CLINICAL DATA:  Fall, elbow pain EXAM: RIGHT ELBOW - COMPLETE 3+ VIEW COMPARISON:  None. FINDINGS: Diffuse soft tissue swelling. No acute bony abnormality. Specifically, no fracture, subluxation, or dislocation. No joint effusion. IMPRESSION: Diffuse soft tissue swelling.  No acute bony abnormality. Electronically Signed   By: Rolm Baptise M.D.   On: 10/09/2019 09:21    Procedures Procedures (including critical care time)  Medications Ordered in ED Medications  HYDROcodone-acetaminophen (NORCO/VICODIN) 5-325 MG per tablet 2 tablet (2 tablets Oral Not Given 10/09/19 0903)    ED  Course  I have reviewed the triage vital signs and the nursing notes.  Pertinent labs & imaging results that were available during my care of the patient were reviewed  by me and considered in my medical decision making (see chart for details).    MDM Rules/Calculators/A&P                     No fracture shown on the x-ray.  Patient does have intact range of motion of the elbow without evident effusion.  He has large soft tissue hematoma of the medial upper arm.  Patient is taking Effient.  No signs of compartment syndrome or nerve injury at this time.  Recommendations given for elevating and icing.  Return precautions reviewed.  Patient does have hydrocodone prescription at home to take for pain. Final Clinical Impression(s) / ED Diagnoses Final diagnoses:  Injury of right elbow, initial encounter  Hematoma    Rx / DC Orders ED Discharge Orders    None       Charlesetta Shanks, MD 10/09/19 1030

## 2019-10-09 NOTE — Discharge Instructions (Signed)
1.  You have a very large hematoma of your upper arm.  Your x-ray does not show any evidence of a fracture in your elbow.  Try to elevate your arm as much as possible and apply ice packs for swelling.  Continue to take your hydrocodone at home for pain. 2.  If you develop any numbness or tingling into the hand or the arm, severely increasing pain or difficulty moving the elbow, return to the emergency department. 3.  Follow-up with your doctor for recheck to make sure your injury is healing.  The large collection of blood in the upper arm will likely start to be visible in the lower arm.  Due to gravity, blood will track down and you may see purpleish yellow and brown discoloration in the forearm.

## 2019-10-15 ENCOUNTER — Other Ambulatory Visit: Payer: Self-pay

## 2019-10-15 ENCOUNTER — Encounter: Payer: Self-pay | Admitting: Family Medicine

## 2019-10-15 ENCOUNTER — Ambulatory Visit (INDEPENDENT_AMBULATORY_CARE_PROVIDER_SITE_OTHER): Payer: Medicare HMO | Admitting: Family Medicine

## 2019-10-15 DIAGNOSIS — M25521 Pain in right elbow: Secondary | ICD-10-CM | POA: Diagnosis not present

## 2019-10-15 NOTE — Progress Notes (Signed)
Office Visit Note   Patient: Travis CRAINE Sr.           Date of Birth: 08-May-1951           MRN: [id number] Visit Date: 10/15/2019 Requested by: [name], MD [place],  [place] [address] PCP: [name], MD  Subjective: Chief Complaint  Patient presents with  . Right Elbow - Pain    Pain lateral elbow. The patient fell on 10/08/19, when his right BKA stump came out of his prosthesis and he hit the elbow directly on concrete - see ED note from 10/09/19.    HPI: He is here with right elbow pain.  On May 19 he fell when his BKA stump came out of his prosthesis.  He landed directly on his right elbow.  He went to the ER where x-rays were negative for obvious fracture.  He had quite a bit of bruising and swelling, he still complains of pain on the posterior aspect of his elbow.  He is right-hand dominant.  He has a chronic chronic rotator cuff [place] cuff tear on the right so his ago his use of his arm is not like it used to be, but he still relies on it to use his cane for support.  He lives alone and does not have reliable transportation.                ROS:   All other systems were reviewed and are negative.  Objective: Vital Signs: There were no vitals taken for this visit.  Physical Exam:  General:  Alert and oriented, in no acute distress. Pulm:  Breathing unlabored. Psy:  Normal mood, congruent affect. Skin: Bruising on the posterior aspect of his elbow and into the forearm. Right elbow: He is tender to palpation over the triceps tendon and he has a palpable small defect in the tendon.  He has pain with extension of the elbow against resistance but he is still able to resist with good strength.  No tenderness over the radial head or on the medial side of his elbow.  Imaging: Limited diagnostic ultrasound: I imaged his elbow posteriorly but did not record the images or bill for the procedure.  He does have a high-grade partial tear of the triceps  tendon.   Assessment & Plan: 1.   Right elbow partial tear triceps tendon -We discussed the diagnosis and options.  He is adamant that he does not want surgery even if a tear is confirmed with MRI scan.  Therefore we will try home health physical therapy to rehabilitate his arm is much as able. -If he fails to improve he will call me and I will order an MRI scan at that point.     Procedures: No procedures performed  No notes on file     PMFS History: Patient Active Problem List   Diagnosis Date Noted  . Cough 07/29/2019  . Wheezing 07/29/2019  . Possible exposure to STD 07/24/2019  . Unilateral primary osteoarthritis, left hip 06/06/2019  . Status post total replacement of left hip 06/06/2019  . S/P laparoscopic sleeve gastrectomy 03/03/2019  . Rash 01/24/2019  . Fever 08/15/2018  . UTI (urinary tract infection) 08/15/2018  . Fall at home, initial encounter 08/15/2018  . Chronic anemia 08/15/2018  . Hypokalemia 08/15/2018  . Bowel incontinence 07/25/2018  . Other spondylosis with radiculopathy, lumbar region 07/16/2018    Class: Chronic  . Status post lumbar laminectomy 07/16/2018  .  Status post THR (total hip replacement) 04/17/2018  . Unilateral primary osteoarthritis, right hip   . Morbid obesity with BMI of 40.0-44.9, adult ([place]) 02/28/2018  . Myocardial infarction ([place]) 02/25/2018  . Coronary artery disease involving native coronary artery of native heart 02/25/2018  . PAD (peripheral artery disease) ([place]) 02/25/2018  . S/P BKA (below knee amputation) unilateral, right ([name]) 02/25/2018  . Sacroiliitis ([place]) 02/25/2018  . SOB (shortness of breath) 01/03/2018  . Acute on chronic heart failure ([place]) 01/03/2018  . Severe right groin pain 12/20/2017  . Morbid obesity ([place]) 10/09/2017  . Low back pain 07/13/2017  . Leukoplakia, tongue 01/26/2017  . Hyperglycemia 10/20/2016  . Skin lesion 10/20/2016  . S/P unilateral BKA (below knee amputation), right ([place])  06/14/2016  . Charcot foot due to diabetes mellitus ([place])   . Charcot's arthropathy associated with type 2 diabetes mellitus ([place]) 04/11/2016  . Preventative health care 11/05/2015  . Major depression 09/13/2015  . S/P TKR (total knee replacement) bilaterally 09/13/2015  . GERD (gastroesophageal reflux disease) 09/08/2015  . S/P laparoscopic hernia repair 05/11/2015  . PPD positive 04/08/2015  . Benign neoplasm of descending colon   . Benign neoplasm of cecum   . Acute blood loss anemia   . Chronic anticoagulation   . Occult blood positive stool 10/17/2014  . General weakness 07/14/2014  . Urinary incontinence 07/14/2014  . Headache(784.0) 10/15/2013  . Spinal stenosis in cervical region 09/26/2013    Class: Chronic  . Spinal stenosis of lumbar region 09/26/2013    Class: Chronic  . Hand joint pain 06/10/2013  . Rotator cuff tear arthropathy of both shoulders 06/10/2013  . Skin lesion of cheek 05/01/2013  . Pain of right thumb 04/03/2013  . Balance disorder 03/12/2013  . Gait disorder 03/12/2013  . Tremor 03/12/2013  . Left hip pain 03/12/2013  . Pre-ulcerative corn or callous 02/06/2013  . Anxiety 11/12/2011  . OSA on CPAP 11/07/2011  . Bradycardia 10/20/2011  . Insomnia 10/04/2011  . Obesity 01/12/2011  . Diabetes mellitus type 2 in obese ([place]) 09/27/2010  . ERECTILE DYSFUNCTION, ORGANIC 05/30/2010  . [place] DISEASE, LUMBAR 04/19/2010  . SCIATICA, LEFT 04/19/2010  . Chronic pain syndrome 10/27/2009  . Hyperlipidemia 07/15/2009  . Essential hypertension 06/24/2009  . Coronary artery disease involving native coronary artery of native heart without angina pectoris 06/24/2009  . Allergic rhinitis 06/24/2009  . URETHRAL STRICTURE 06/24/2009  . DEGENERATIVE JOINT DISEASE 06/24/2009  . SHOULDER PAIN, BILATERAL 06/24/2009  . FATIGUE 06/24/2009  . NEPHROLITHIASIS, HX OF 06/24/2009   Past Medical History:  Diagnosis Date  . ALLERGIC RHINITIS 06/24/2009  . Allergy   . Anemia     IDA  . Anxiety 11/12/2011   Adequate for discharge   . Arthritis    "all my joints" (09/30/2013)  . Arthritis of foot, right, degenerative 04/15/2014  . Balance disorder 03/12/2013  . Benign neoplasm of cecum   . Benign neoplasm of descending colon   . CAD (coronary artery disease) 06/24/2009   5 stents placed in 2007    . Chronic anticoagulation   . Chronic pain syndrome 10/27/2009   of ankle, shoulders, low back.  sciatica.   . Closed fracture of right foot 10/17/2014  . CORONARY ARTERY DISEASE 06/24/2009   a. s/p multiple PCIs - In 2008 he had a Taxus DES to the mild LAD, Endeavor DES to mid LCX and distal LCX. In January 2009 he had DES to distal LCX, mid LCX and proximal LCX. In November  08/06/07 had BMS x 2 to the mid RCA. Cath 10/2011 with patent stents, noncardiac CP. LHC 01/2013: patent stents (noncardiac CP).  . DEGENERATIVE JOINT DISEASE 06/24/2009   Qualifier: Diagnosis of  By: [name], [name]   . Depression   . Depression with anxiety    Prior suicide attempt(08/25/19-pt states not suicide attempt)  . [place] DISEASE, LUMBAR 04/19/2010  . ERECTILE DYSFUNCTION, ORGANIC 05/30/2010  . Essential hypertension 06/24/2009   Qualifier: Diagnosis of  By: [name], [name]   . Fibromyalgia   . Fracture dislocation of ankle joint 09/02/2015  . Gait disorder 03/12/2013  . General weakness 07/14/2014  . GERD (gastroesophageal reflux disease) 09/08/2015   08/25/15-pt states was cardiac origin, not GERD  . Hand joint pain 06/10/2013  . Heart murmur   . Hepatitis C    treated pt. unknown with what he was a teenager  . History of kidney stones   . Hyperlipidemia 07/15/2009   Qualifier: Diagnosis of  By: Aundra Dubin, MD, [place]    . HYPERLIPIDEMIA-MIXED 07/15/2009   08/25/19-pt state cholesterol was normal range  . HYPERTENSION 06/24/2009  . Insomnia 10/04/2011  . Irregular heart beat   . Left hip pain 03/12/2013   Injected under ultrasound guidance on June 24, 2013   . Major depression 09/13/2015  . Myocardial  infarction ([place]) 2006/08/05  . Non-cardiac chest pain 10/2011, 01/2013  . Obesity   . Occult blood positive stool 10/17/2014  . Open ankle fracture 09/02/2015  . OSA (obstructive sleep apnea)    not using CPAP (09/30/2013)  . Pain of right thumb 04/03/2013  . Pneumonia   . PPD positive 04/08/2015  . Pre-ulcerative corn or callous 02/06/2013  . Rotator cuff tear arthropathy of both shoulders 06/10/2013   History of bilateral shoulder cuff surgery for rotator cuff tears. Reports increase in pain 09/11/2015 during physical therapy of the left shoulder.   [name] SCIATICA, LEFT 04/19/2010   Qualifier: Diagnosis of  By: [name], [name]   . Sleep apnea    wears cpap 08/25/19-States not using due to nasal stuffiness.  [name] Spinal stenosis in cervical region 09/26/2013  . Spinal stenosis, lumbar region, with neurogenic claudication 09/26/2013  . Type II diabetes mellitus ([place]) 05-Aug-2010   no meds in 09/2014.   [name] Uncontrolled type 2 DM with peripheral circulatory disorder ([place]) 10/04/2013   08/25/19- pt states A1C normal, no diabetes  . URETHRAL STRICTURE 06/24/2009   self catheterizes.     Family History  Problem Relation Age of Onset  . Depression Mother   . Heart disease Mother   . Hypertension Mother   . Breast cancer Mother   . Cancer Mother   . Stomach cancer Mother   . Esophageal cancer Mother   . Pancreatic cancer Mother   . Diabetes Father   . Heart disease Father        CABG  . Hypertension Father   . Hyperlipidemia Father   . Prostate cancer Father   . Skin cancer Father   . Depression Brother        x 2  . Hypertension Brother        x2  . Colon polyps Brother   . Healthy Son   . Heart disease Maternal Grandfather   . Early death Maternal Grandfather   . Heart attack Maternal Grandfather 2063/08/06  . Early death Paternal Grandfather   . Coronary artery disease Other   . Hypertension Other   . Depression Other   .  Healthy Son   . Colon cancer Neg Hx   . Rectal cancer Neg Hx     Past Surgical  History:  Procedure Laterality Date  . AMPUTATION Right 06/14/2016   Procedure: AMPUTATION BELOW KNEE;  Surgeon: Newt Minion, MD;  Location: [facility];  Service: Orthopedics;  Laterality: Right;  . ANKLE FUSION Right 04/15/2014   Procedure: Right Subtalar, Talonavicular Fusion;  Surgeon: Newt Minion, MD;  Location: [facility];  Service: Orthopedics;  Laterality: Right;  . ANKLE FUSION Right 04/18/2016   Procedure: Right Ankle Tibiocalcaneal Fusion;  Surgeon: Newt Minion, MD;  Location: [facility];  Service: Orthopedics;  Laterality: Right;  . ANTERIOR CERVICAL DECOMP/DISCECTOMY FUSION N/A 09/26/2013   Procedure: ANTERIOR CERVICAL DISCECTOMY FUSION C3-4, plate and screw fixation, allograft bone graft;  Surgeon: Jessy Oto, MD;  Location: [facility];  Service: Orthopedics;  Laterality: N/A;  . BACK SURGERY     3  . BELOW KNEE LEG AMPUTATION Right 06/14/2016   right ankle and foot  . CARDIAC CATHETERIZATION  X 1  . CARPAL TUNNEL RELEASE Bilateral   . COLONOSCOPY N/A 10/22/2014   Procedure: COLONOSCOPY;  Surgeon: Lafayette Dragon, MD;  Location[facility] ENDOSCOPY;  Service: Endoscopy;  Laterality: N/A;  . COLONOSCOPY  11/19/2018  . CORONARY ANGIOPLASTY WITH STENT PLACEMENT     "I have 9 stents"  . ESOPHAGOGASTRODUODENOSCOPY N/A 10/19/2014   Procedure: ESOPHAGOGASTRODUODENOSCOPY (EGD);  Surgeon: Jerene Bears, MD;  Location[facility]COPY;  Service: Endoscopy;  Laterality: N/A;  . FRACTURE SURGERY    . FUSION OF TALONAVICULAR JOINT Right 04/15/2014   dr duda  . GASTRIC BYPASS  02/20/2019  . HERNIA REPAIR     umbilical  . INGUINAL HERNIA REPAIR Right 05/11/2015   Procedure: LAPAROSCOPIC REPAIR RIGHT  INGUINAL HERNIA;  Surgeon: Greer Pickerel, MD;  Location: [facility];  Service: General;  Laterality: Right;  . INSERTION OF MESH Right 05/11/2015   Procedure: INSERTION OF MESH;  Surgeon: Greer Pickerel, MD;  Location: [facility];  Service: General;  Laterality: Right;  . JOINT REPLACEMENT    . KNEE CARTILAGE SURGERY Right X 12    "~ 1/2 open; ~ 1/2 scopes"  . KNEE CARTILAGE SURGERY Left X 3   "3 scopes"  . LAPAROSCOPIC GASTRIC SLEEVE RESECTION N/A 03/03/2019   Procedure: LAPAROSCOPIC GASTRIC SLEEVE RESECTION, Upper Endo, ERAS Pathway;  Surgeon: Greer Pickerel, MD;  Location: [facility];  Service: General;  Laterality: N/A;  . LEFT HEART CATHETERIZATION WITH CORONARY ANGIOGRAM N/A 02/10/2013   Procedure: LEFT HEART CATHETERIZATION WITH CORONARY ANGIOGRAM;  Surgeon: Burnell Blanks, MD;  [facility] LAB;  Service: Cardiovascular;  Laterality: N/A;  . LUMBAR LAMINECTOMY N/A 07/16/2018   Procedure: LEFT L4-5 REDO PARTIAL LUMBAR HEMILAMINECTOMY WITH FORAMINOTOMY LEFT L4;  Surgeon: Jessy Oto, MD;  Location: [place] [facility];  Service: Orthopedics;  Laterality: N/A;  . LUMBAR LAMINECTOMY/DECOMPRESSION MICRODISCECTOMY N/A 01/27/2014   Procedure: CENTRAL LUMBAR LAMINECTOMY L4-5 AND L3-4;  Surgeon: Jessy Oto, MD;  Location: [facility];  Service: Orthopedics;  Laterality: N/A;  . ORIF ANKLE FRACTURE Right 09/02/2015   Procedure: OPEN REDUCTION INTERNAL FIXATION (ORIF) ANKLE FRACTURE;  Surgeon: Newt Minion, MD;  Location: [facility];  Service: Orthopedics;  Laterality: Right;  . PERIPHERALLY INSERTED CENTRAL CATHETER INSERTION  09/02/2015  . POLYPECTOMY    . SHOULDER ARTHROSCOPY W/ ROTATOR CUFF REPAIR Bilateral    "3 on the right; 1 on the left"  . SKIN SPLIT GRAFT Right 10/01/2015  Procedure: RIGHT ANKLE APPLY SKIN GRAFT SPLIT THICKNESS;  Surgeon: Newt Minion, MD;  Location: [facility];  Service: Orthopedics;  Laterality: Right;  . TONSILLECTOMY    . TOOTH EXTRACTION    . TOTAL HIP ARTHROPLASTY Right 04/17/2018  . TOTAL HIP ARTHROPLASTY Right 04/17/2018   Procedure: RIGHT TOTAL HIP ARTHROPLASTY;  Surgeon: Newt Minion, MD;  Location: [facility];  Service: Orthopedics;  Laterality: Right;  . TOTAL HIP ARTHROPLASTY Left 06/06/2019   Procedure: LEFT TOTAL HIP ARTHROPLASTY ANTERIOR APPROACH;  Surgeon: Mcarthur Rossetti, MD;  Location: [facility];  Service: Orthopedics;  Laterality: Left;  . TOTAL HIP REVISION Left 05/24/2019  . TOTAL KNEE ARTHROPLASTY Bilateral 2008  . UMBILICAL HERNIA REPAIR     UHR  . UPPER GASTROINTESTINAL ENDOSCOPY  2016  . URETHRAL DILATION  X 4  . VASECTOMY    . WISDOM TOOTH EXTRACTION     Social History   Occupational History  . Occupation: disabled since 2006 due to ortho. heart, psych    Employer: UNEMPLOYED  . Occupation: part time work as an Multimedia programmer, wrestling, and Holiday representative  Tobacco Use  . Smoking status: Former Smoker    Types: Cigars    Quit date: 08/28/2010    Years since quitting: 9.1  . Smokeless tobacco: Never Used  . Tobacco comment: 04/18/2016 "smoked 1 cigar/wk when I did smoke"  Substance and Sexual Activity  . Alcohol use: Not Currently    Alcohol/week: 0.0 standard drinks    Comment: rarely  . Drug use: No  . Sexual activity: Not Currently

## 2019-10-17 DIAGNOSIS — G894 Chronic pain syndrome: Secondary | ICD-10-CM | POA: Diagnosis not present

## 2019-10-17 DIAGNOSIS — M961 Postlaminectomy syndrome, not elsewhere classified: Secondary | ICD-10-CM | POA: Diagnosis not present

## 2019-10-17 DIAGNOSIS — M792 Neuralgia and neuritis, unspecified: Secondary | ICD-10-CM | POA: Diagnosis not present

## 2019-10-23 ENCOUNTER — Telehealth: Payer: Self-pay | Admitting: Internal Medicine

## 2019-10-23 DIAGNOSIS — Z79899 Other long term (current) drug therapy: Secondary | ICD-10-CM | POA: Diagnosis not present

## 2019-10-23 DIAGNOSIS — G894 Chronic pain syndrome: Secondary | ICD-10-CM | POA: Diagnosis not present

## 2019-10-23 NOTE — Telephone Encounter (Signed)
Patient dropped off papers for Dr. Jenny Reichmann to fill out for bus service. Placed in providers box 10-23-2019. Please call patient when ready for him to pick up.

## 2019-10-24 NOTE — Telephone Encounter (Signed)
Placed papers in Dr. Gwynn Burly box. Will contact pt once papers are filled out.

## 2019-10-25 ENCOUNTER — Other Ambulatory Visit (HOSPITAL_COMMUNITY): Payer: Self-pay | Admitting: Psychiatry

## 2019-10-25 ENCOUNTER — Other Ambulatory Visit: Payer: Self-pay | Admitting: Internal Medicine

## 2019-10-25 DIAGNOSIS — F411 Generalized anxiety disorder: Secondary | ICD-10-CM

## 2019-10-25 DIAGNOSIS — F319 Bipolar disorder, unspecified: Secondary | ICD-10-CM

## 2019-10-26 NOTE — Telephone Encounter (Signed)
Please refill as per office routine med refill policy (all routine meds refilled for 3 mo or monthly per pt preference up to one year from last visit, then month to month grace period for 3 mo, then further med refills will have to be denied)  

## 2019-10-27 ENCOUNTER — Telehealth: Payer: Self-pay

## 2019-10-27 DIAGNOSIS — E11610 Type 2 diabetes mellitus with diabetic neuropathic arthropathy: Secondary | ICD-10-CM | POA: Diagnosis not present

## 2019-10-27 DIAGNOSIS — M5116 Intervertebral disc disorders with radiculopathy, lumbar region: Secondary | ICD-10-CM | POA: Diagnosis not present

## 2019-10-27 DIAGNOSIS — E1151 Type 2 diabetes mellitus with diabetic peripheral angiopathy without gangrene: Secondary | ICD-10-CM | POA: Diagnosis not present

## 2019-10-27 DIAGNOSIS — I509 Heart failure, unspecified: Secondary | ICD-10-CM | POA: Diagnosis not present

## 2019-10-27 DIAGNOSIS — I110 Hypertensive heart disease with heart failure: Secondary | ICD-10-CM | POA: Diagnosis not present

## 2019-10-27 DIAGNOSIS — E1136 Type 2 diabetes mellitus with diabetic cataract: Secondary | ICD-10-CM | POA: Diagnosis not present

## 2019-10-27 DIAGNOSIS — M4726 Other spondylosis with radiculopathy, lumbar region: Secondary | ICD-10-CM | POA: Diagnosis not present

## 2019-10-27 DIAGNOSIS — I2510 Atherosclerotic heart disease of native coronary artery without angina pectoris: Secondary | ICD-10-CM | POA: Diagnosis not present

## 2019-10-27 DIAGNOSIS — S46311D Strain of muscle, fascia and tendon of triceps, right arm, subsequent encounter: Secondary | ICD-10-CM | POA: Diagnosis not present

## 2019-10-27 NOTE — Telephone Encounter (Signed)
Please advise 

## 2019-10-27 NOTE — Telephone Encounter (Signed)
I called and gave verbal orders to [facility].

## 2019-10-27 NOTE — Telephone Encounter (Signed)
Travis [name] with [facility] would like verbal orders for 2 x week for 4 wks., and 1 x week for 5 wks.  Cb# [phone].  Please advise.  Thank you.

## 2019-10-27 NOTE — Telephone Encounter (Signed)
Ok to approve orders.

## 2019-10-28 ENCOUNTER — Other Ambulatory Visit: Payer: Self-pay | Admitting: Specialist

## 2019-10-28 DIAGNOSIS — M5440 Lumbago with sciatica, unspecified side: Secondary | ICD-10-CM

## 2019-11-01 ENCOUNTER — Other Ambulatory Visit (HOSPITAL_COMMUNITY): Payer: Self-pay | Admitting: Psychiatry

## 2019-11-01 ENCOUNTER — Other Ambulatory Visit: Payer: Self-pay | Admitting: Cardiovascular Disease

## 2019-11-01 DIAGNOSIS — I110 Hypertensive heart disease with heart failure: Secondary | ICD-10-CM | POA: Diagnosis not present

## 2019-11-01 DIAGNOSIS — F319 Bipolar disorder, unspecified: Secondary | ICD-10-CM

## 2019-11-01 DIAGNOSIS — M5116 Intervertebral disc disorders with radiculopathy, lumbar region: Secondary | ICD-10-CM | POA: Diagnosis not present

## 2019-11-01 DIAGNOSIS — E1151 Type 2 diabetes mellitus with diabetic peripheral angiopathy without gangrene: Secondary | ICD-10-CM | POA: Diagnosis not present

## 2019-11-01 DIAGNOSIS — M4726 Other spondylosis with radiculopathy, lumbar region: Secondary | ICD-10-CM | POA: Diagnosis not present

## 2019-11-01 DIAGNOSIS — I2510 Atherosclerotic heart disease of native coronary artery without angina pectoris: Secondary | ICD-10-CM | POA: Diagnosis not present

## 2019-11-01 DIAGNOSIS — E11610 Type 2 diabetes mellitus with diabetic neuropathic arthropathy: Secondary | ICD-10-CM | POA: Diagnosis not present

## 2019-11-01 DIAGNOSIS — I509 Heart failure, unspecified: Secondary | ICD-10-CM | POA: Diagnosis not present

## 2019-11-01 DIAGNOSIS — S46311D Strain of muscle, fascia and tendon of triceps, right arm, subsequent encounter: Secondary | ICD-10-CM | POA: Diagnosis not present

## 2019-11-01 DIAGNOSIS — F411 Generalized anxiety disorder: Secondary | ICD-10-CM

## 2019-11-01 DIAGNOSIS — E1136 Type 2 diabetes mellitus with diabetic cataract: Secondary | ICD-10-CM | POA: Diagnosis not present

## 2019-11-03 DIAGNOSIS — N5201 Erectile dysfunction due to arterial insufficiency: Secondary | ICD-10-CM | POA: Diagnosis not present

## 2019-11-03 DIAGNOSIS — N401 Enlarged prostate with lower urinary tract symptoms: Secondary | ICD-10-CM | POA: Diagnosis not present

## 2019-11-03 DIAGNOSIS — R3915 Urgency of urination: Secondary | ICD-10-CM | POA: Diagnosis not present

## 2019-11-03 DIAGNOSIS — N35011 Post-traumatic bulbous urethral stricture: Secondary | ICD-10-CM | POA: Diagnosis not present

## 2019-11-03 NOTE — Telephone Encounter (Signed)
Called patient and he was not home but stated that he does not need refill at this time and is good on lipitor 20 mg daily. If he gets home and that changes he will call and let us know.

## 2019-11-04 DIAGNOSIS — I509 Heart failure, unspecified: Secondary | ICD-10-CM | POA: Diagnosis not present

## 2019-11-04 DIAGNOSIS — M5116 Intervertebral disc disorders with radiculopathy, lumbar region: Secondary | ICD-10-CM | POA: Diagnosis not present

## 2019-11-04 DIAGNOSIS — E1151 Type 2 diabetes mellitus with diabetic peripheral angiopathy without gangrene: Secondary | ICD-10-CM | POA: Diagnosis not present

## 2019-11-04 DIAGNOSIS — S46311D Strain of muscle, fascia and tendon of triceps, right arm, subsequent encounter: Secondary | ICD-10-CM | POA: Diagnosis not present

## 2019-11-04 DIAGNOSIS — I110 Hypertensive heart disease with heart failure: Secondary | ICD-10-CM | POA: Diagnosis not present

## 2019-11-04 DIAGNOSIS — E11610 Type 2 diabetes mellitus with diabetic neuropathic arthropathy: Secondary | ICD-10-CM | POA: Diagnosis not present

## 2019-11-04 DIAGNOSIS — I2510 Atherosclerotic heart disease of native coronary artery without angina pectoris: Secondary | ICD-10-CM | POA: Diagnosis not present

## 2019-11-04 DIAGNOSIS — E1136 Type 2 diabetes mellitus with diabetic cataract: Secondary | ICD-10-CM | POA: Diagnosis not present

## 2019-11-04 DIAGNOSIS — M4726 Other spondylosis with radiculopathy, lumbar region: Secondary | ICD-10-CM | POA: Diagnosis not present

## 2019-11-07 DIAGNOSIS — M5116 Intervertebral disc disorders with radiculopathy, lumbar region: Secondary | ICD-10-CM | POA: Diagnosis not present

## 2019-11-07 DIAGNOSIS — M4726 Other spondylosis with radiculopathy, lumbar region: Secondary | ICD-10-CM | POA: Diagnosis not present

## 2019-11-07 DIAGNOSIS — E11610 Type 2 diabetes mellitus with diabetic neuropathic arthropathy: Secondary | ICD-10-CM | POA: Diagnosis not present

## 2019-11-07 DIAGNOSIS — I509 Heart failure, unspecified: Secondary | ICD-10-CM | POA: Diagnosis not present

## 2019-11-07 DIAGNOSIS — I2510 Atherosclerotic heart disease of native coronary artery without angina pectoris: Secondary | ICD-10-CM | POA: Diagnosis not present

## 2019-11-07 DIAGNOSIS — E1151 Type 2 diabetes mellitus with diabetic peripheral angiopathy without gangrene: Secondary | ICD-10-CM | POA: Diagnosis not present

## 2019-11-07 DIAGNOSIS — S46311D Strain of muscle, fascia and tendon of triceps, right arm, subsequent encounter: Secondary | ICD-10-CM | POA: Diagnosis not present

## 2019-11-07 DIAGNOSIS — I110 Hypertensive heart disease with heart failure: Secondary | ICD-10-CM | POA: Diagnosis not present

## 2019-11-07 DIAGNOSIS — E1136 Type 2 diabetes mellitus with diabetic cataract: Secondary | ICD-10-CM | POA: Diagnosis not present

## 2019-11-12 DIAGNOSIS — G4733 Obstructive sleep apnea (adult) (pediatric): Secondary | ICD-10-CM | POA: Diagnosis not present

## 2019-11-13 DIAGNOSIS — E11610 Type 2 diabetes mellitus with diabetic neuropathic arthropathy: Secondary | ICD-10-CM | POA: Diagnosis not present

## 2019-11-13 DIAGNOSIS — E1151 Type 2 diabetes mellitus with diabetic peripheral angiopathy without gangrene: Secondary | ICD-10-CM | POA: Diagnosis not present

## 2019-11-13 DIAGNOSIS — M5116 Intervertebral disc disorders with radiculopathy, lumbar region: Secondary | ICD-10-CM | POA: Diagnosis not present

## 2019-11-13 DIAGNOSIS — S46311D Strain of muscle, fascia and tendon of triceps, right arm, subsequent encounter: Secondary | ICD-10-CM | POA: Diagnosis not present

## 2019-11-13 DIAGNOSIS — I2510 Atherosclerotic heart disease of native coronary artery without angina pectoris: Secondary | ICD-10-CM | POA: Diagnosis not present

## 2019-11-13 DIAGNOSIS — I110 Hypertensive heart disease with heart failure: Secondary | ICD-10-CM | POA: Diagnosis not present

## 2019-11-13 DIAGNOSIS — M4726 Other spondylosis with radiculopathy, lumbar region: Secondary | ICD-10-CM | POA: Diagnosis not present

## 2019-11-13 DIAGNOSIS — I509 Heart failure, unspecified: Secondary | ICD-10-CM | POA: Diagnosis not present

## 2019-11-13 DIAGNOSIS — E1136 Type 2 diabetes mellitus with diabetic cataract: Secondary | ICD-10-CM | POA: Diagnosis not present

## 2019-11-14 DIAGNOSIS — M4726 Other spondylosis with radiculopathy, lumbar region: Secondary | ICD-10-CM | POA: Diagnosis not present

## 2019-11-14 DIAGNOSIS — M5116 Intervertebral disc disorders with radiculopathy, lumbar region: Secondary | ICD-10-CM | POA: Diagnosis not present

## 2019-11-14 DIAGNOSIS — I2510 Atherosclerotic heart disease of native coronary artery without angina pectoris: Secondary | ICD-10-CM | POA: Diagnosis not present

## 2019-11-14 DIAGNOSIS — E1136 Type 2 diabetes mellitus with diabetic cataract: Secondary | ICD-10-CM | POA: Diagnosis not present

## 2019-11-14 DIAGNOSIS — S46311D Strain of muscle, fascia and tendon of triceps, right arm, subsequent encounter: Secondary | ICD-10-CM | POA: Diagnosis not present

## 2019-11-14 DIAGNOSIS — E1151 Type 2 diabetes mellitus with diabetic peripheral angiopathy without gangrene: Secondary | ICD-10-CM | POA: Diagnosis not present

## 2019-11-14 DIAGNOSIS — E11610 Type 2 diabetes mellitus with diabetic neuropathic arthropathy: Secondary | ICD-10-CM | POA: Diagnosis not present

## 2019-11-14 DIAGNOSIS — I509 Heart failure, unspecified: Secondary | ICD-10-CM | POA: Diagnosis not present

## 2019-11-14 DIAGNOSIS — I110 Hypertensive heart disease with heart failure: Secondary | ICD-10-CM | POA: Diagnosis not present

## 2019-11-17 ENCOUNTER — Other Ambulatory Visit: Payer: Self-pay | Admitting: Internal Medicine

## 2019-11-18 ENCOUNTER — Other Ambulatory Visit: Payer: Self-pay

## 2019-11-18 ENCOUNTER — Telehealth (INDEPENDENT_AMBULATORY_CARE_PROVIDER_SITE_OTHER): Payer: Medicare HMO | Admitting: Psychiatry

## 2019-11-18 ENCOUNTER — Encounter (HOSPITAL_COMMUNITY): Payer: Self-pay | Admitting: Psychiatry

## 2019-11-18 VITALS — Wt 265.0 lb

## 2019-11-18 DIAGNOSIS — F319 Bipolar disorder, unspecified: Secondary | ICD-10-CM | POA: Diagnosis not present

## 2019-11-18 DIAGNOSIS — F4321 Adjustment disorder with depressed mood: Secondary | ICD-10-CM | POA: Diagnosis not present

## 2019-11-18 DIAGNOSIS — F411 Generalized anxiety disorder: Secondary | ICD-10-CM

## 2019-11-18 MED ORDER — DULOXETINE HCL 30 MG PO CPEP: 30.0000 mg | ORAL_CAPSULE | Freq: Two times a day (BID) | ORAL | 0 refills | Status: DC

## 2019-11-18 MED ORDER — ARIPIPRAZOLE 5 MG PO TABS: 5.0000 mg | ORAL_TABLET | Freq: Every day | ORAL | 0 refills | Status: DC

## 2019-11-18 MED ORDER — TRAZODONE HCL 150 MG PO TABS: 150.0000 mg | ORAL_TABLET | Freq: Every day | ORAL | 0 refills | Status: DC

## 2019-11-18 MED ORDER — BENZTROPINE MESYLATE 0.5 MG PO TABS: 0.5000 mg | ORAL_TABLET | Freq: Every day | ORAL | 0 refills | Status: DC

## 2019-11-18 NOTE — Progress Notes (Signed)
Virtual Visit via Telephone Note  I connected with Travis [name] Sr. on 11/18/19 at 10:00 AM EDT by telephone and verified that I am speaking with the correct person using two identifiers.  Location: Patient: home Provider: home office   I discussed the limitations, risks, security and privacy concerns of performing an evaluation and management service by telephone and the availability of in person appointments. I also discussed with the patient that there may be a patient responsible charge related to this service. The patient expressed understanding and agreed to proceed.   History of Present Illness: Patient is evaluated by phone session.  Today he is very sad because his 67 year old son died due to massive heart attack this morning.  Patient told heart attack runs in the family and his father also died at an early age.  He is planning to attend funeral and leaving town coming Thursday.  Otherwise he is doing well.  He had a fall 4 weeks ago but he recover well.  He is also in his relationship and hoping to move [place] next month.  Patient told that woman is very supportive and loving but he is going to see her first time.  He is hoping that if things go well he may move in [place] permanently.  Currently he is living with his niece.  He had stopped taking Klonopin because he is feeling good.  He is taking Abilify, Cogentin, Cymbalta and trazodone.  He has no tremor shakes or any EPS.  He has chronic pain for which he takes pain medicine from the pain management.  Since he has a bariatric surgery he is losing weight slowly and gradually and he has upcoming appointment in few weeks.  Patient like to keep his current medication.  He has not seen Dr. Cheryln Manly in past few months because he has been doing good but now like to reschedule appointment for grief counseling.  Past Psychiatric History:Reviewed Sawpsychiatrist in [place] and [place]. H/Omood swings,suicidal  gesture,anger,hallucination, irritability and increase spending.Married 5 times. H/Oinpatient in 2014 due to overdose on Xanaxandalcohol.DidIOP. TookEffexor, Xanax, Klonopin, Ambien,Lunesta, Wellbutrin,Ambien,Remeronand Prozac. Abilify started in our office.    Psychiatric Specialty Exam: Physical Exam  Review of Systems  Weight 265 lb (120.2 kg).There is no height or weight on file to calculate BMI.  General Appearance: NA  Eye Contact:  NA  Speech:  Slow  Volume:  Normal  Mood:  Dysphoric  Affect:  NA  Thought Process:  Goal Directed  Orientation:  Full (Time, Place, and Person)  Thought Content:  WDL  Suicidal Thoughts:  No  Homicidal Thoughts:  No  Memory:  Immediate;   Good Recent;   Good Remote;   Good  Judgement:  Intact  Insight:  Present  Psychomotor Activity:  NA  Concentration:  Concentration: Fair and Attention Span: Fair  Recall:  Good  Fund of Knowledge:  Good  Language:  Good  Akathisia:  No  Handed:  Right  AIMS (if indicated):     Assets:  Communication Skills Desire for Improvement Housing Resilience  ADL's:  Intact  Cognition:  WNL  Sleep:   ok      Assessment and Plan: Bipolar disorder type I.  Generalized anxiety disorder.  Grief.  Patient lost his 67 year old son in [place].  Reassurance given and recommended grief counseling and patient agreed to restart therapy with Dr. Cheryln Manly.  Overall he has been doing well.  He stopped taking Klonopin which was given by PCP.  He has  no tremor shakes or any EPS.  He has a plan to move [place] since relationship going well with the woman.  I will continue Cymbalta 30 mg twice a day, Cogentin 0.5 mg at bedtime, Abilify 5 mg daily and trazodone 50 mg at bedtime.  He is taking narcotic pain medicine from pain management.  Discussed medication side effects and benefits.  Recommended to call us back if he has any question or any concern.  Follow-up in 3 months unless patient decided to stay  and permanently move to [place].    Follow Up Instructions:    I discussed the assessment and treatment plan with the patient. The patient was provided an opportunity to ask questions and all were answered. The patient agreed with the plan and demonstrated an understanding of the instructions.   The patient was advised to call back or seek an in-person evaluation if the symptoms worsen or if the condition fails to improve as anticipated.  I provided 20 minutes of non-face-to-face time during this encounter.   Kathlee Nations, MD

## 2019-11-19 ENCOUNTER — Telehealth: Payer: Self-pay | Admitting: Family Medicine

## 2019-11-19 ENCOUNTER — Other Ambulatory Visit: Payer: Self-pay | Admitting: Internal Medicine

## 2019-11-19 NOTE — Telephone Encounter (Signed)
Done erx 

## 2019-11-19 NOTE — Telephone Encounter (Signed)
Sauk from [email or web address]  called to report. Patient had an immediate family member pass and missed 2 physical therapy visit. [facility] called to report. [facility] phone number is [phone] if any question or concerns arise.

## 2019-11-26 ENCOUNTER — Other Ambulatory Visit (INDEPENDENT_AMBULATORY_CARE_PROVIDER_SITE_OTHER): Payer: Medicare HMO

## 2019-11-26 DIAGNOSIS — E1151 Type 2 diabetes mellitus with diabetic peripheral angiopathy without gangrene: Secondary | ICD-10-CM | POA: Diagnosis not present

## 2019-11-26 DIAGNOSIS — I110 Hypertensive heart disease with heart failure: Secondary | ICD-10-CM | POA: Diagnosis not present

## 2019-11-26 DIAGNOSIS — E11610 Type 2 diabetes mellitus with diabetic neuropathic arthropathy: Secondary | ICD-10-CM | POA: Diagnosis not present

## 2019-11-26 DIAGNOSIS — E1136 Type 2 diabetes mellitus with diabetic cataract: Secondary | ICD-10-CM | POA: Diagnosis not present

## 2019-11-26 DIAGNOSIS — I2510 Atherosclerotic heart disease of native coronary artery without angina pectoris: Secondary | ICD-10-CM | POA: Diagnosis not present

## 2019-11-26 DIAGNOSIS — M5116 Intervertebral disc disorders with radiculopathy, lumbar region: Secondary | ICD-10-CM | POA: Diagnosis not present

## 2019-11-26 DIAGNOSIS — M4726 Other spondylosis with radiculopathy, lumbar region: Secondary | ICD-10-CM | POA: Diagnosis not present

## 2019-11-26 DIAGNOSIS — I509 Heart failure, unspecified: Secondary | ICD-10-CM | POA: Diagnosis not present

## 2019-11-26 DIAGNOSIS — D509 Iron deficiency anemia, unspecified: Secondary | ICD-10-CM | POA: Diagnosis not present

## 2019-11-26 DIAGNOSIS — S46311D Strain of muscle, fascia and tendon of triceps, right arm, subsequent encounter: Secondary | ICD-10-CM | POA: Diagnosis not present

## 2019-11-26 LAB — CBC WITH DIFFERENTIAL/PLATELET
Basophils Absolute: 0 10*3/uL (ref 0.0–0.1)
Basophils Relative: 0.7 % (ref 0.0–3.0)
Eosinophils Absolute: 0.4 10*3/uL (ref 0.0–0.7)
Eosinophils Relative: 6.2 % — ABNORMAL HIGH (ref 0.0–5.0)
HCT: 41 % (ref 39.0–52.0)
Hemoglobin: 13.9 g/dL (ref 13.0–17.0)
Lymphocytes Relative: 35.2 % (ref 12.0–46.0)
Lymphs Abs: 2.2 10*3/uL (ref 0.7–4.0)
MCHC: 33.8 g/dL (ref 30.0–36.0)
MCV: 90.3 fl (ref 78.0–100.0)
Monocytes Absolute: 0.5 10*3/uL (ref 0.1–1.0)
Monocytes Relative: 8 % (ref 3.0–12.0)
Neutro Abs: 3.2 10*3/uL (ref 1.4–7.7)
Neutrophils Relative %: 49.9 % (ref 43.0–77.0)
Platelets: 224 10*3/uL (ref 150.0–400.0)
RBC: 4.54 Mil/uL (ref 4.22–5.81)
RDW: 15.9 % — ABNORMAL HIGH (ref 11.5–15.5)
WBC: 6.3 10*3/uL (ref 4.0–10.5)

## 2019-11-29 DIAGNOSIS — G4733 Obstructive sleep apnea (adult) (pediatric): Secondary | ICD-10-CM | POA: Diagnosis not present

## 2019-12-01 ENCOUNTER — Other Ambulatory Visit: Payer: Self-pay | Admitting: Internal Medicine

## 2019-12-02 ENCOUNTER — Ambulatory Visit: Payer: Medicare HMO | Admitting: Dietician

## 2019-12-02 DIAGNOSIS — I2510 Atherosclerotic heart disease of native coronary artery without angina pectoris: Secondary | ICD-10-CM | POA: Diagnosis not present

## 2019-12-02 DIAGNOSIS — E1136 Type 2 diabetes mellitus with diabetic cataract: Secondary | ICD-10-CM | POA: Diagnosis not present

## 2019-12-02 DIAGNOSIS — S46311D Strain of muscle, fascia and tendon of triceps, right arm, subsequent encounter: Secondary | ICD-10-CM | POA: Diagnosis not present

## 2019-12-02 DIAGNOSIS — M5116 Intervertebral disc disorders with radiculopathy, lumbar region: Secondary | ICD-10-CM | POA: Diagnosis not present

## 2019-12-02 DIAGNOSIS — E1151 Type 2 diabetes mellitus with diabetic peripheral angiopathy without gangrene: Secondary | ICD-10-CM | POA: Diagnosis not present

## 2019-12-02 DIAGNOSIS — M4726 Other spondylosis with radiculopathy, lumbar region: Secondary | ICD-10-CM | POA: Diagnosis not present

## 2019-12-02 DIAGNOSIS — E11610 Type 2 diabetes mellitus with diabetic neuropathic arthropathy: Secondary | ICD-10-CM | POA: Diagnosis not present

## 2019-12-02 DIAGNOSIS — I509 Heart failure, unspecified: Secondary | ICD-10-CM | POA: Diagnosis not present

## 2019-12-02 DIAGNOSIS — I110 Hypertensive heart disease with heart failure: Secondary | ICD-10-CM | POA: Diagnosis not present

## 2019-12-03 DIAGNOSIS — R3915 Urgency of urination: Secondary | ICD-10-CM | POA: Diagnosis not present

## 2019-12-03 DIAGNOSIS — N35011 Post-traumatic bulbous urethral stricture: Secondary | ICD-10-CM | POA: Diagnosis not present

## 2019-12-03 DIAGNOSIS — N401 Enlarged prostate with lower urinary tract symptoms: Secondary | ICD-10-CM | POA: Diagnosis not present

## 2019-12-03 DIAGNOSIS — R350 Frequency of micturition: Secondary | ICD-10-CM | POA: Diagnosis not present

## 2019-12-09 DIAGNOSIS — I2510 Atherosclerotic heart disease of native coronary artery without angina pectoris: Secondary | ICD-10-CM | POA: Diagnosis not present

## 2019-12-09 DIAGNOSIS — I110 Hypertensive heart disease with heart failure: Secondary | ICD-10-CM | POA: Diagnosis not present

## 2019-12-09 DIAGNOSIS — M4726 Other spondylosis with radiculopathy, lumbar region: Secondary | ICD-10-CM | POA: Diagnosis not present

## 2019-12-09 DIAGNOSIS — I509 Heart failure, unspecified: Secondary | ICD-10-CM | POA: Diagnosis not present

## 2019-12-09 DIAGNOSIS — M5116 Intervertebral disc disorders with radiculopathy, lumbar region: Secondary | ICD-10-CM | POA: Diagnosis not present

## 2019-12-09 DIAGNOSIS — E1136 Type 2 diabetes mellitus with diabetic cataract: Secondary | ICD-10-CM | POA: Diagnosis not present

## 2019-12-09 DIAGNOSIS — E1151 Type 2 diabetes mellitus with diabetic peripheral angiopathy without gangrene: Secondary | ICD-10-CM | POA: Diagnosis not present

## 2019-12-09 DIAGNOSIS — E11610 Type 2 diabetes mellitus with diabetic neuropathic arthropathy: Secondary | ICD-10-CM | POA: Diagnosis not present

## 2019-12-09 DIAGNOSIS — S46311D Strain of muscle, fascia and tendon of triceps, right arm, subsequent encounter: Secondary | ICD-10-CM | POA: Diagnosis not present

## 2019-12-11 ENCOUNTER — Other Ambulatory Visit: Payer: Self-pay | Admitting: Internal Medicine

## 2019-12-11 NOTE — Telephone Encounter (Signed)
Done erx 

## 2019-12-12 DIAGNOSIS — M47816 Spondylosis without myelopathy or radiculopathy, lumbar region: Secondary | ICD-10-CM | POA: Diagnosis not present

## 2019-12-12 DIAGNOSIS — M961 Postlaminectomy syndrome, not elsewhere classified: Secondary | ICD-10-CM | POA: Diagnosis not present

## 2019-12-12 DIAGNOSIS — G894 Chronic pain syndrome: Secondary | ICD-10-CM | POA: Diagnosis not present

## 2019-12-15 ENCOUNTER — Ambulatory Visit: Payer: Medicare HMO | Admitting: Psychology

## 2019-12-15 DIAGNOSIS — I2510 Atherosclerotic heart disease of native coronary artery without angina pectoris: Secondary | ICD-10-CM | POA: Diagnosis not present

## 2019-12-15 DIAGNOSIS — I110 Hypertensive heart disease with heart failure: Secondary | ICD-10-CM | POA: Diagnosis not present

## 2019-12-15 DIAGNOSIS — S46311D Strain of muscle, fascia and tendon of triceps, right arm, subsequent encounter: Secondary | ICD-10-CM | POA: Diagnosis not present

## 2019-12-15 DIAGNOSIS — M4726 Other spondylosis with radiculopathy, lumbar region: Secondary | ICD-10-CM | POA: Diagnosis not present

## 2019-12-15 DIAGNOSIS — E1136 Type 2 diabetes mellitus with diabetic cataract: Secondary | ICD-10-CM | POA: Diagnosis not present

## 2019-12-15 DIAGNOSIS — I509 Heart failure, unspecified: Secondary | ICD-10-CM | POA: Diagnosis not present

## 2019-12-15 DIAGNOSIS — E11610 Type 2 diabetes mellitus with diabetic neuropathic arthropathy: Secondary | ICD-10-CM | POA: Diagnosis not present

## 2019-12-15 DIAGNOSIS — E1151 Type 2 diabetes mellitus with diabetic peripheral angiopathy without gangrene: Secondary | ICD-10-CM | POA: Diagnosis not present

## 2019-12-15 DIAGNOSIS — M5116 Intervertebral disc disorders with radiculopathy, lumbar region: Secondary | ICD-10-CM | POA: Diagnosis not present

## 2019-12-16 ENCOUNTER — Encounter: Payer: Medicare HMO | Attending: General Surgery | Admitting: Skilled Nursing Facility1

## 2019-12-16 ENCOUNTER — Other Ambulatory Visit: Payer: Self-pay

## 2019-12-16 DIAGNOSIS — E669 Obesity, unspecified: Secondary | ICD-10-CM | POA: Insufficient documentation

## 2019-12-16 NOTE — Progress Notes (Signed)
Follow-up visit:  Post-Operative sleeve Surgery  Medical Nutrition Therapy:  Appt start time: 6:00pm end time:  7:00pm  Primary concerns today: Post-operative Bariatric Surgery Nutrition Management 6 Month Post-Op Class  Anthropometrics (*all weights include prosthesis)  Start weight at [facility]: 317.8 lbs (date: 01/17/2018)  Today's weight: 291 lbs    Pt states he has not been well due to falling from his prosthesis being too big and his son having passed away so he has been emotionally eating. Pt states he has an  appt with a talk therapist.  Goals: -mindful meals check list -work on Letting your guilt go -find a hobby  Fluid intake: adequate   Medications: See List Supplementation: appropriate   Using straws: no Drinking while eating: no Having you been chewing well: yes Chewing/swallowing difficulties: no Changes in vision: no Changes to mood/headaches: no Hair loss/Cahnges to skin/Changes to nails: no Any difficulty focusing or concentrating: no Sweating: no Dizziness/Lightheaded: no Palpitations: no  Carbonated beverages: no N/V/D/C/GAS: no Abdominal Pain: no Dumping syndrome: no  Recent physical activity:  ADL's  Progress Towards Goal(s):  In Progress   Teaching Method Utilized:  Visual Auditory Hands on  Demonstrated degree of understanding via:  Teach Back   Monitoring/Evaluation:  Dietary intake, exercise, and body weight. Follow up in 1 month

## 2019-12-17 DIAGNOSIS — Z8709 Personal history of other diseases of the respiratory system: Secondary | ICD-10-CM | POA: Diagnosis not present

## 2019-12-17 DIAGNOSIS — J330 Polyp of nasal cavity: Secondary | ICD-10-CM | POA: Diagnosis not present

## 2019-12-24 DIAGNOSIS — E11610 Type 2 diabetes mellitus with diabetic neuropathic arthropathy: Secondary | ICD-10-CM | POA: Diagnosis not present

## 2019-12-24 DIAGNOSIS — E1136 Type 2 diabetes mellitus with diabetic cataract: Secondary | ICD-10-CM | POA: Diagnosis not present

## 2019-12-24 DIAGNOSIS — I2510 Atherosclerotic heart disease of native coronary artery without angina pectoris: Secondary | ICD-10-CM | POA: Diagnosis not present

## 2019-12-24 DIAGNOSIS — I110 Hypertensive heart disease with heart failure: Secondary | ICD-10-CM | POA: Diagnosis not present

## 2019-12-24 DIAGNOSIS — M4726 Other spondylosis with radiculopathy, lumbar region: Secondary | ICD-10-CM | POA: Diagnosis not present

## 2019-12-24 DIAGNOSIS — S46311D Strain of muscle, fascia and tendon of triceps, right arm, subsequent encounter: Secondary | ICD-10-CM | POA: Diagnosis not present

## 2019-12-24 DIAGNOSIS — E1151 Type 2 diabetes mellitus with diabetic peripheral angiopathy without gangrene: Secondary | ICD-10-CM | POA: Diagnosis not present

## 2019-12-24 DIAGNOSIS — I509 Heart failure, unspecified: Secondary | ICD-10-CM | POA: Diagnosis not present

## 2019-12-24 DIAGNOSIS — M5116 Intervertebral disc disorders with radiculopathy, lumbar region: Secondary | ICD-10-CM | POA: Diagnosis not present

## 2019-12-25 ENCOUNTER — Other Ambulatory Visit: Payer: Self-pay | Admitting: Gastroenterology

## 2019-12-25 DIAGNOSIS — M47816 Spondylosis without myelopathy or radiculopathy, lumbar region: Secondary | ICD-10-CM | POA: Diagnosis not present

## 2019-12-29 ENCOUNTER — Ambulatory Visit (INDEPENDENT_AMBULATORY_CARE_PROVIDER_SITE_OTHER): Payer: Medicare HMO | Admitting: Orthopaedic Surgery

## 2019-12-29 ENCOUNTER — Encounter: Payer: Self-pay | Admitting: Orthopaedic Surgery

## 2019-12-29 ENCOUNTER — Other Ambulatory Visit: Payer: Self-pay

## 2019-12-29 DIAGNOSIS — G8929 Other chronic pain: Secondary | ICD-10-CM | POA: Diagnosis not present

## 2019-12-29 DIAGNOSIS — M25511 Pain in right shoulder: Secondary | ICD-10-CM

## 2019-12-29 NOTE — Progress Notes (Signed)
The patient comes in today with chronic right shoulder pain is significantly worsened.  He actually saw Dr. Marlou Sa a year ago who recommended a CT scan with thin cuts to further evaluate his right shoulder for shoulder replacement surgery.  He has had worsening function of the right shoulder and pain.  He is interested in proceeding with CT scan because that was never ordered and seeing Dr. Marlou Sa.  On my exam today his shoulder function is severely limited.  There is a lot of grinding at the right shoulder glenohumeral joint and severe limitations in abduction.  We will go ahead and order the CT scan of his right shoulder for preoperative planning.  We will have him get an appoint with Dr. Marlou Sa sometime in the next 2 to 3 weeks in order to go over the CT scan and make plans from there.

## 2019-12-30 DIAGNOSIS — G4733 Obstructive sleep apnea (adult) (pediatric): Secondary | ICD-10-CM | POA: Diagnosis not present

## 2020-01-02 ENCOUNTER — Telehealth: Payer: Self-pay | Admitting: General Practice

## 2020-01-02 ENCOUNTER — Telehealth: Payer: Self-pay | Admitting: *Deleted

## 2020-01-02 NOTE — Telephone Encounter (Signed)
Will be resubmitted cardiac evaluation request form with updated parameters on holding Effient for implant trial.  Patient needs office visit.

## 2020-01-02 NOTE — Telephone Encounter (Signed)
Please call pt to schedule appt for clearance per Coletta Memos, NP.

## 2020-01-02 NOTE — Telephone Encounter (Signed)
Primary Cardiologist:Christopher Angelena Form, MD  Chart reviewed as part of pre-operative protocol coverage. Because of Travis DUGGER Sr.'s past medical history and time since last visit, Travis [name] will require a follow-up visit in order to better assess preoperative cardiovascular risk.  Pre-op covering staff: - Please schedule appointment and call patient to inform them. - Please contact requesting surgeon's office via preferred method (i.e, phone, fax) to inform them of need for appointment prior to surgery.  If applicable, this message will also be routed to pharmacy pool and/or primary cardiologist for input on holding anticoagulant/antiplatelet agent as requested below so that this information is available at time of patient's appointment.   Travis Pelton, NP  01/02/2020, 4:05 PM

## 2020-01-02 NOTE — Telephone Encounter (Signed)
° °  [facility] Pre-operative Risk Assessment    HEARTCARE STAFF: - Please ensure there is not already an duplicate clearance open for this procedure. - Under Visit Info/Reason for Call, type in Other and utilize the format Clearance MM/DD/YY or Clearance TBD. Do not use dashes or single digits. - If request is for dental extraction, please clarify the # of teeth to be extracted.  Request for surgical clearance:  1. What type of surgery is being performed? SPINAL CORD STIMULATOR TRIAL LEAD IMPLANT   2. When is this surgery scheduled? TBD   3. What type of clearance is required (medical clearance vs. Pharmacy clearance to hold med vs. Both)? MEDICAL  4. Are there any medications that need to be held prior to surgery and how long? EFFIENT CESSATION REQUIRED. PER [facility] GUIDELINES, EFFIENT MUST BE STOPPED 10 FULL DAYS PRIOR TO TRIAL LEAD IMPLANT, AND DURING THE 7 DAYS TRIAL PERIOD FOR A TOTAL OF 17 DAYS. (TRIAL PERIOD CAN BE SHORTENED IF DEEMED NECESSARY BY PRESCRIBING PROVIDER).   5. Practice name and name of physician performing surgery? [facility]; DR. Orma Flaming PATEL   6. What is the office phone number? [phone]   7.   What is the office fax number? [place]  8.   Anesthesia type (None, local, MAC, general) ? LEFT MESSAGE FOR CALL BACK WITH TYPE OF ANESTHESIA   Julaine Hua 01/02/2020, 3:42 PM  _________________________________________________________________   (provider comments below)

## 2020-01-05 NOTE — Telephone Encounter (Signed)
Pt scheduled for clearance appt with Richardson Dopp, PA on Tuesday, 8/31 at 2:15 PM.

## 2020-01-06 ENCOUNTER — Ambulatory Visit (INDEPENDENT_AMBULATORY_CARE_PROVIDER_SITE_OTHER): Payer: Medicare HMO | Admitting: Psychology

## 2020-01-06 DIAGNOSIS — F332 Major depressive disorder, recurrent severe without psychotic features: Secondary | ICD-10-CM

## 2020-01-07 DIAGNOSIS — J339 Nasal polyp, unspecified: Secondary | ICD-10-CM | POA: Diagnosis not present

## 2020-01-09 ENCOUNTER — Other Ambulatory Visit: Payer: Self-pay | Admitting: Orthopaedic Surgery

## 2020-01-09 ENCOUNTER — Other Ambulatory Visit: Payer: Self-pay

## 2020-01-09 ENCOUNTER — Ambulatory Visit
Admission: RE | Admit: 2020-01-09 | Discharge: 2020-01-09 | Disposition: A | Discharge status: Home / Self Care | Payer: Medicare HMO | Source: Ambulatory Visit | Attending: Orthopaedic Surgery | Admitting: Orthopaedic Surgery

## 2020-01-09 DIAGNOSIS — M19011 Primary osteoarthritis, right shoulder: Secondary | ICD-10-CM | POA: Diagnosis not present

## 2020-01-09 DIAGNOSIS — G8929 Other chronic pain: Secondary | ICD-10-CM

## 2020-01-09 DIAGNOSIS — M6259 Muscle wasting and atrophy, not elsewhere classified, multiple sites: Secondary | ICD-10-CM | POA: Diagnosis not present

## 2020-01-09 DIAGNOSIS — M19041 Primary osteoarthritis, right hand: Secondary | ICD-10-CM | POA: Diagnosis not present

## 2020-01-12 ENCOUNTER — Other Ambulatory Visit: Payer: Self-pay | Admitting: Internal Medicine

## 2020-01-12 ENCOUNTER — Other Ambulatory Visit: Payer: Self-pay | Admitting: Cardiovascular Disease

## 2020-01-12 ENCOUNTER — Other Ambulatory Visit (HOSPITAL_COMMUNITY): Payer: Self-pay | Admitting: Psychiatry

## 2020-01-12 ENCOUNTER — Ambulatory Visit (INDEPENDENT_AMBULATORY_CARE_PROVIDER_SITE_OTHER): Payer: Medicare HMO | Admitting: Surgical

## 2020-01-12 DIAGNOSIS — M19011 Primary osteoarthritis, right shoulder: Secondary | ICD-10-CM

## 2020-01-12 DIAGNOSIS — F411 Generalized anxiety disorder: Secondary | ICD-10-CM

## 2020-01-12 DIAGNOSIS — F319 Bipolar disorder, unspecified: Secondary | ICD-10-CM

## 2020-01-13 ENCOUNTER — Ambulatory Visit (INDEPENDENT_AMBULATORY_CARE_PROVIDER_SITE_OTHER): Payer: Medicare HMO | Admitting: Orthopedic Surgery

## 2020-01-13 ENCOUNTER — Ambulatory Visit (INDEPENDENT_AMBULATORY_CARE_PROVIDER_SITE_OTHER): Payer: Medicare HMO

## 2020-01-13 ENCOUNTER — Other Ambulatory Visit: Payer: Self-pay

## 2020-01-13 ENCOUNTER — Encounter: Payer: Self-pay | Admitting: Orthopedic Surgery

## 2020-01-13 DIAGNOSIS — G8929 Other chronic pain: Secondary | ICD-10-CM | POA: Diagnosis not present

## 2020-01-13 DIAGNOSIS — M5441 Lumbago with sciatica, right side: Secondary | ICD-10-CM

## 2020-01-13 DIAGNOSIS — M5442 Lumbago with sciatica, left side: Secondary | ICD-10-CM | POA: Diagnosis not present

## 2020-01-13 MED ORDER — PREDNISONE 10 MG PO TABS: 20.0000 mg | ORAL_TABLET | Freq: Every day | ORAL | 0 refills | Status: DC

## 2020-01-13 NOTE — Progress Notes (Signed)
Office Visit Note   Patient: Travis CASS Sr.           Date of Birth: September 12, 1950           MRN: [id number] Visit Date: 01/13/2020              Requested by: [name], MD [place],  [place] [id number] PCP: [name], MD  Chief Complaint  Patient presents with  . Lower Back - Pain      HPI: Patient is is a 69 year old gentleman who is seen for chronic back pain with worsening sciatic symptoms on the left side.  Assessment & Plan: Visit Diagnoses:  1. Chronic bilateral low back pain with bilateral sciatica     Plan: Patient is called in a prescription for prednisone to take 20 mg with breakfast.  He will follow-up with Dr. Louanne Skye.  Discussed that he will need an MRI scan and most likely could benefit from an epidural steroid injection he will be evaluated for possible surgical intervention with Dr. Louanne Skye.  Patient is being considered for a total shoulder arthroplasty with Dr. Marlou Sa and recommended that he get his back under control before he proceeds with the total shoulder.  Follow-Up Instructions: Return if symptoms worsen or fail to improve, for Follow-up with Dr. Louanne Skye as scheduled.   Ortho Exam  Patient is alert, oriented, no adenopathy, well-dressed, normal affect, normal respiratory effort. Examination patient has an antalgic gait he has bilateral total hips and a left total knee.  Semination the left lower extremity has venous stasis swelling and edema.  Patient has a positive straight leg raise on the left he has no focal motor weakness and is active plantar flexion dorsiflexion EHL and hip flexion without weakness.  Imaging: No results found. No images are attached to the encounter.  Labs: Lab Results  Component Value Date   HGBA1C 5.3 07/24/2019   HGBA1C 5.8 (A) 01/24/2019   HGBA1C 5.2 06/03/2018   ESRSEDRATE 22 (H) 09/03/2018   ESRSEDRATE 35 (H) 08/15/2018   ESRSEDRATE 20 (H) 09/03/2013   CRP 7.3 09/03/2018   CRP 14.1 (H)  08/15/2018   CRP 10.7 (H) 09/03/2013   REPTSTATUS 08/16/2018 FINAL 08/15/2018   GRAMSTAIN  09/02/2013    WBC PRESENT, PREDOMINANTLY MONONUCLEAR NO ORGANISMS SEEN CYTOSPIN Performed by [facility] Performed at [place]  09/02/2013    NO ORGANISMS SEEN WBC PRESENT, PREDOMINANTLY MONONUCLEAR CYTOSPIN Gram Stain Report Called to,Read Back By and Verified With: A LEDWELL RN 1933 09/02/13 A NAVARRO   CULT (A) 08/15/2018    <10,000 COLONIES/mL INSIGNIFICANT GROWTH Performed at [facility], [place] [address] [place], [place] [address]    LABORGA STAPHYLOCOCCUS SPECIES (COAGULASE NEGATIVE) 04/08/2015     Lab Results  Component Value Date   ALBUMIN 3.9 07/24/2019   ALBUMIN 3.8 03/04/2019   ALBUMIN 4.5 02/27/2019    Lab Results  Component Value Date   MG 1.5 (L) 08/15/2018   MG 1.9 05/03/2013   Lab Results  Component Value Date   VD25OH 24.67 (L) 07/24/2019    No results found for: PREALBUMIN CBC EXTENDED Latest Ref Rng & Units 11/26/2019 07/24/2019 06/09/2019  WBC 4.0 - 10.5 K/uL 6.3 5.6 6.3  RBC 4.22 - 5.81 Mil/uL 4.54 3.74(L) 3.08(L)  HGB 13.0 - 17.0 g/dL 13.9 10.5(L) 9.3(L)  HCT 39 - 52 % 41.0 32.5(L) 27.5(L)  PLT 150 - 400 K/uL 224.0 293.0 252  NEUTROABS  1.4 - 7.7 K/uL 3.2 3.0 -  LYMPHSABS 0.7 - 4.0 K/uL 2.2 1.5 -     There is no height or weight on file to calculate BMI.  Orders:  Orders Placed This Encounter  Procedures  . XR Lumbar Spine 2-3 Views   No orders of the defined types were placed in this encounter.    Procedures: No procedures performed  Clinical Data: No additional findings.  ROS:  All other systems negative, except as noted in the HPI. Review of Systems  Objective: Vital Signs: There were no vitals taken for this visit.  Specialty Comments:  No specialty comments available.  PMFS History: Patient Active Problem List   Diagnosis Date Noted  . Cough 07/29/2019  . Wheezing 07/29/2019  . Possible  exposure to STD 07/24/2019  . Unilateral primary osteoarthritis, left hip 06/06/2019  . Status post total replacement of left hip 06/06/2019  . S/P laparoscopic sleeve gastrectomy 03/03/2019  . Rash 01/24/2019  . Fever 08/15/2018  . UTI (urinary tract infection) 08/15/2018  . Fall at home, initial encounter 08/15/2018  . Chronic anemia 08/15/2018  . Hypokalemia 08/15/2018  . Bowel incontinence 07/25/2018  . Other spondylosis with radiculopathy, lumbar region 07/16/2018    Class: Chronic  . Status post lumbar laminectomy 07/16/2018  . Status post THR (total hip replacement) 04/17/2018  . Unilateral primary osteoarthritis, right hip   . Morbid obesity with BMI of 40.0-44.9, adult ([place]) 02/28/2018  . Myocardial infarction ([place]) 02/25/2018  . Coronary artery disease involving native coronary artery of native heart 02/25/2018  . PAD (peripheral artery disease) ([place]) 02/25/2018  . S/P BKA (below knee amputation) unilateral, right ([place]) 02/25/2018  . Sacroiliitis ([place]) 02/25/2018  . SOB (shortness of breath) 01/03/2018  . Acute on chronic heart failure ([place]) 01/03/2018  . Severe right groin pain 12/20/2017  . Morbid obesity ([place]) 10/09/2017  . Low back pain 07/13/2017  . Leukoplakia, tongue 01/26/2017  . Hyperglycemia 10/20/2016  . Skin lesion 10/20/2016  . S/P unilateral BKA (below knee amputation), right ([place]) 06/14/2016  . Charcot foot due to diabetes mellitus ([place])   . Charcot's arthropathy associated with type 2 diabetes mellitus ([place]) 04/11/2016  . Preventative health care 11/05/2015  . Major depression 09/13/2015  . S/P TKR (total knee replacement) bilaterally 09/13/2015  . GERD (gastroesophageal reflux disease) 09/08/2015  . S/P laparoscopic hernia repair 05/11/2015  . PPD positive 04/08/2015  . Benign neoplasm of descending colon   . Benign neoplasm of cecum   . Acute blood loss anemia   . Chronic anticoagulation   . Occult blood positive stool 10/17/2014  . General  weakness 07/14/2014  . Urinary incontinence 07/14/2014  . Headache(784.0) 10/15/2013  . Spinal stenosis in cervical region 09/26/2013    Class: Chronic  . Spinal stenosis of lumbar region 09/26/2013    Class: Chronic  . Hand joint pain 06/10/2013  . Rotator cuff tear arthropathy of both shoulders 06/10/2013  . Skin lesion of cheek 05/01/2013  . Pain of right thumb 04/03/2013  . Balance disorder 03/12/2013  . Gait disorder 03/12/2013  . Tremor 03/12/2013  . Left hip pain 03/12/2013  . Pre-ulcerative corn or callous 02/06/2013  . Anxiety 11/12/2011  . OSA on CPAP 11/07/2011  . Bradycardia 10/20/2011  . Insomnia 10/04/2011  . Obesity 01/12/2011  . Diabetes mellitus type 2 in obese ([place]) 09/27/2010  . ERECTILE DYSFUNCTION, ORGANIC 05/30/2010  . Dr[place]gs DISEASE, LUMBAR 04/19/2010  . SCIATICA, LEFT 04/19/2010  . Chronic pain syndrome 10/27/2009  .  Hyperlipidemia 07/15/2009  . Essential hypertension 06/24/2009  . Coronary artery disease involving native coronary artery of native heart without angina pectoris 06/24/2009  . Allergic rhinitis 06/24/2009  . URETHRAL STRICTURE 06/24/2009  . DEGENERATIVE JOINT DISEASE 06/24/2009  . SHOULDER PAIN, BILATERAL 06/24/2009  . FATIGUE 06/24/2009  . NEPHROLITHIASIS, HX OF 06/24/2009   Past Medical History:  Diagnosis Date  . ALLERGIC RHINITIS 06/24/2009  . Allergy   . Anemia    IDA  . Anxiety 11/12/2011   Adequate for discharge   . Arthritis    "all my joints" (09/30/2013)  . Arthritis of foot, right, degenerative 04/15/2014  . Balance disorder 03/12/2013  . Benign neoplasm of cecum   . Benign neoplasm of descending colon   . CAD (coronary artery disease) 06/24/2009   5 stents placed in 2007    . Chronic anticoagulation   . Chronic pain syndrome 10/27/2009   of ankle, shoulders, low back.  sciatica.   . Closed fracture of right foot 10/17/2014  . CORONARY ARTERY DISEASE 06/24/2009   a. s/p multiple PCIs - In 2008 he had a Taxus DES to the mild  LAD, Endeavor DES to mid LCX and distal LCX. In January 2009 he had DES to distal LCX, mid LCX and proximal LCX. In November 2009 had BMS x 2 to the mid RCA. Cath 10/2011 with patent stents, noncardiac CP. LHC 01/2013: patent stents (noncardiac CP).  . DEGENERATIVE JOINT DISEASE 06/24/2009   Qualifier: Diagnosis of  By: [name], [name]   . Depression   . Depression with anxiety    Prior suicide attempt(08/25/19-pt states not suicide attempt)  . Brice DISEASE, LUMBAR 04/19/2010  . ERECTILE DYSFUNCTION, ORGANIC 05/30/2010  . Essential hypertension 06/24/2009   Qualifier: Diagnosis of  By: [name], [name]   . Fibromyalgia   . Fracture dislocation of ankle joint 09/02/2015  . Gait disorder 03/12/2013  . General weakness 07/14/2014  . GERD (gastroesophageal reflux disease) 09/08/2015   08/25/15-pt states was cardiac origin, not GERD  . Hand joint pain 06/10/2013  . Heart murmur   . Hepatitis C    treated pt. unknown with what he was a teenager  . History of kidney stones   . Hyperlipidemia 07/15/2009   Qualifier: Diagnosis of  By: Aundra Dubin, MD, [place]    . HYPERLIPIDEMIA-MIXED 07/15/2009   08/25/19-pt state cholesterol was normal range  . HYPERTENSION 06/24/2009  . Insomnia 10/04/2011  . Irregular heart beat   . Left hip pain 03/12/2013   Injected under ultrasound guidance on June 24, 2013   . Major depression 09/13/2015  . Myocardial infarction ([place]) 2008  . Non-cardiac chest pain 10/2011, 01/2013  . Obesity   . Occult blood positive stool 10/17/2014  . Open ankle fracture 09/02/2015  . OSA (obstructive sleep apnea)    not using CPAP (09/30/2013)  . Pain of right thumb 04/03/2013  . Pneumonia   . PPD positive 04/08/2015  . Pre-ulcerative corn or callous 02/06/2013  . Rotator cuff tear arthropathy of both shoulders 06/10/2013   History of bilateral shoulder cuff surgery for rotator cuff tears. Reports increase in pain 09/11/2015 during physical therapy of the left shoulder.   [name] SCIATICA, LEFT 04/19/2010    Qualifier: Diagnosis of  By: [name], [name]   . Sleep apnea    wears cpap 08/25/19-States not using due to nasal stuffiness.  [name] Spinal stenosis in cervical region 09/26/2013  . Spinal stenosis, lumbar region, with neurogenic claudication 09/26/2013  .  Type II diabetes mellitus ([place]) August 04, 2010   no meds in 09/2014.   [name] Uncontrolled type 2 DM with peripheral circulatory disorder ([place]) 10/04/2013   08/25/19- pt states A1C normal, no diabetes  . URETHRAL STRICTURE 06/24/2009   self catheterizes.     Family History  Problem Relation Age of Onset  . Depression Mother   . Heart disease Mother   . Hypertension Mother   . Breast cancer Mother   . Cancer Mother   . Stomach cancer Mother   . Esophageal cancer Mother   . Pancreatic cancer Mother   . Diabetes Father   . Heart disease Father        CABG  . Hypertension Father   . Hyperlipidemia Father   . Prostate cancer Father   . Skin cancer Father   . Depression Brother        x 2  . Hypertension Brother        x2  . Colon polyps Brother   . Healthy Son   . Heart disease Maternal Grandfather   . Early death Maternal Grandfather   . Heart attack Maternal Grandfather 2063-08-05  . Early death Paternal Grandfather   . Coronary artery disease Other   . Hypertension Other   . Depression Other   . Healthy Son   . Colon cancer Neg Hx   . Rectal cancer Neg Hx     Past Surgical History:  Procedure Laterality Date  . AMPUTATION Right 06/14/2016   Procedure: AMPUTATION BELOW KNEE;  Surgeon: Newt Minion, MD;  Location: [place];  Service: Orthopedics;  Laterality: Right;  . ANKLE FUSION Right 04/15/2014   Procedure: Right Subtalar, Talonavicular Fusion;  Surgeon: Newt Minion, MD;  Location: [place];  Service: Orthopedics;  Laterality: Right;  . ANKLE FUSION Right 04/18/2016   Procedure: Right Ankle Tibiocalcaneal Fusion;  Surgeon: Newt Minion, MD;  Location: [place];  Service: Orthopedics;  Laterality: Right;  . ANTERIOR CERVICAL DECOMP/DISCECTOMY FUSION N/A  09/26/2013   Procedure: ANTERIOR CERVICAL DISCECTOMY FUSION C3-4, plate and screw fixation, allograft bone graft;  Surgeon: Jessy Oto, MD;  Location: [place];  Service: Orthopedics;  Laterality: N/A;  . BACK SURGERY     3  . BELOW KNEE LEG AMPUTATION Right 06/14/2016   right ankle and foot  . CARDIAC CATHETERIZATION  X 1  . CARPAL TUNNEL RELEASE Bilateral   . COLONOSCOPY N/A 10/22/2014   Procedure: COLONOSCOPY;  Surgeon: Lafayette Dragon, MD;  Location[facility];  Service: Endoscopy;  Laterality: N/A;  . COLONOSCOPY  11/19/2018  . CORONARY ANGIOPLASTY WITH STENT PLACEMENT     "I have 9 stents"  . ESOPHAGOGASTRODUODENOSCOPY N/A 10/19/2014   Procedure: ESOPHAGOGASTRODUODENOSCOPY (EGD);  Surgeon: Jerene Bears, MD;  Location: [facility];  Service: Endoscopy;  Laterality: N/A;  . FRACTURE SURGERY    . FUSION OF TALONAVICULAR JOINT Right 04/15/2014   dr Amarii Bordas  . GASTRIC BYPASS  02/20/2019  . HERNIA REPAIR     umbilical  . INGUINAL HERNIA REPAIR Right 05/11/2015   Procedure: LAPAROSCOPIC REPAIR RIGHT  INGUINAL HERNIA;  Surgeon: Greer Pickerel, MD;  Location: [place];  Service: General;  Laterality: Right;  . INSERTION OF MESH Right 05/11/2015   Procedure: INSERTION OF MESH;  Surgeon: Greer Pickerel, MD;  Location: [place];  Service: General;  Laterality: Right;  . JOINT REPLACEMENT    . KNEE CARTILAGE SURGERY Right X 12   "~ 1/2 open; ~ 1/2 scopes"  . KNEE  CARTILAGE SURGERY Left X 3   "3 scopes"  . LAPAROSCOPIC GASTRIC SLEEVE RESECTION N/A 03/03/2019   Procedure: LAPAROSCOPIC GASTRIC SLEEVE RESECTION, Upper Endo, ERAS Pathway;  Surgeon: Greer Pickerel, MD;  Location: [facility];  Service: General;  Laterality: N/A;  . LEFT HEART CATHETERIZATION WITH CORONARY ANGIOGRAM N/A 02/10/2013   Procedure: LEFT HEART CATHETERIZATION WITH CORONARY ANGIOGRAM;  Surgeon: Burnell Blanks, MD;  [facility];  Service: Cardiovascular;  Laterality: N/A;  . LUMBAR LAMINECTOMY N/A 07/16/2018   Procedure: LEFT  L4-5 REDO PARTIAL LUMBAR HEMILAMINECTOMY WITH FORAMINOTOMY LEFT L4;  Surgeon: Jessy Oto, MD;  Location: [place];  Service: Orthopedics;  Laterality: N/A;  . LUMBAR LAMINECTOMY/DECOMPRESSION MICRODISCECTOMY N/A 01/27/2014   Procedure: CENTRAL LUMBAR LAMINECTOMY L4-5 AND L3-4;  Surgeon: Jessy Oto, MD;  Location: [place];  Service: Orthopedics;  Laterality: N/A;  . ORIF ANKLE FRACTURE Right 09/02/2015   Procedure: OPEN REDUCTION INTERNAL FIXATION (ORIF) ANKLE FRACTURE;  Surgeon: Newt Minion, MD;  Location: [place];  Service: Orthopedics;  Laterality: Right;  . PERIPHERALLY INSERTED CENTRAL CATHETER INSERTION  09/02/2015  . POLYPECTOMY    . SHOULDER ARTHROSCOPY W/ ROTATOR CUFF REPAIR Bilateral    "3 on the right; 1 on the left"  . SKIN SPLIT GRAFT Right 10/01/2015   Procedure: RIGHT ANKLE APPLY SKIN GRAFT SPLIT THICKNESS;  Surgeon: Newt Minion, MD;  Location: [place];  Service: Orthopedics;  Laterality: Right;  . TONSILLECTOMY    . TOOTH EXTRACTION    . TOTAL HIP ARTHROPLASTY Right 04/17/2018  . TOTAL HIP ARTHROPLASTY Right 04/17/2018   Procedure: RIGHT TOTAL HIP ARTHROPLASTY;  Surgeon: Newt Minion, MD;  Location: [place];  Service: Orthopedics;  Laterality: Right;  . TOTAL HIP ARTHROPLASTY Left 06/06/2019   Procedure: LEFT TOTAL HIP ARTHROPLASTY ANTERIOR APPROACH;  Surgeon: Mcarthur Rossetti, MD;  Location: [place];  Service: Orthopedics;  Laterality: Left;  . TOTAL HIP REVISION Left 05/24/2019  . TOTAL KNEE ARTHROPLASTY Bilateral 2008  . UMBILICAL HERNIA REPAIR     UHR  . UPPER GASTROINTESTINAL ENDOSCOPY  2016  . URETHRAL DILATION  X 4  . VASECTOMY    . WISDOM TOOTH EXTRACTION     Social History   Occupational History  . Occupation: disabled since 2006 due to ortho. heart, psych    Employer: UNEMPLOYED  . Occupation: part time work as an Multimedia programmer, wrestling, and Holiday representative  Tobacco Use  . Smoking status: Former Smoker    Types: Cigars    Quit  date: 08/28/2010    Years since quitting: 9.3  . Smokeless tobacco: Never Used  . Tobacco comment: 04/18/2016 "smoked 1 cigar/wk when I did smoke"  Vaping Use  . Vaping Use: Never used  Substance and Sexual Activity  . Alcohol use: Not Currently    Alcohol/week: 0.0 standard drinks    Comment: rarely  . Drug use: No  . Sexual activity: Not Currently

## 2020-01-18 ENCOUNTER — Encounter: Payer: Self-pay | Admitting: Surgical

## 2020-01-18 NOTE — Progress Notes (Signed)
Office Visit Note   Patient: Travis ACHTERBERG Sr.           Date of Birth: [birth year]           MRN: [id number] Visit Date: 01/12/2020 Requested by: [name], MD [place],  [place] [address] PCP: [name], MD  Subjective: Chief Complaint  Patient presents with  . Right Shoulder - Pain    HPI: Travis [name]. is a 69 y.o. male who presents to the office complaining of right shoulder pain.  He notes pain over the last 10 years.  Specifically worse since a fall earlier this year.  He has a history of 3 right shoulder rotator cuff surgeries.  He describes constant and severe pain that wakes him up at night every night.  He notes pain radiates down into his elbow.  Denies any numbness/tingling.  He had an MRI in 2011 that showed a complete tear of the supraspinatus and a large tear of the subscapularis with infraspinatus tendinosis.  He notes the last right shoulder surgery he had was prior to 2011.  He does have a history of diabetes and his last A1c was 5.2.  He has a history of 9 stents.  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  Denies any history of clots.  He states that he is on a blood thinner but he cannot recall which blood thinner dose..                ROS: All systems reviewed are negative as they relate to the chief complaint within the history of present illness.  Patient denies fevers or chills.  Assessment & Plan: Visit Diagnoses:  1. Primary osteoarthritis, right shoulder     Plan: Patient is 69 year old male who presents complaining of long history of right shoulder pain.  He was seen by Dr. Ninfa Linden who ordered a thin cut CT scan and referred him for operative management here.  He has a history of 3 rotator cuff surgeries of the right shoulder.  Last MRI in 2011 with results as detailed above in HPI.  He has weakness of the rotator cuff on exam.  He has significantly reduced range of motion due to glenohumeral osteoarthritis.  Patient is a good  candidate for reverse shoulder arthroplasty.  Discussed this procedure as well as the recovery timeline and the risks and benefits with the patient.  After discussion he wishes to proceed with this procedure eventually.  He does see Dr. Louanne Skye tomorrow for his back and states that he "may have to have a laminectomy".  Depending on how that appointment goes, he will decide whether or not to pursue work-up on his shoulder first or his back.  He will call the office if he decides he wants to proceed with right shoulder replacement.  He should follow-up with Dr. Alphonzo Severance prior to procedure.  Follow-Up Instructions: No follow-ups on file.   Orders:  No orders of the defined types were placed in this encounter.  No orders of the defined types were placed in this encounter.     Procedures: No procedures performed   Clinical Data: No additional findings.  Objective: Vital Signs: There were no vitals taken for this visit.  Physical Exam:  Constitutional: Patient appears well-developed HEENT:  Head: Normocephalic Eyes:EOM are normal Neck: Normal range of motion Cardiovascular: Normal rate Pulmonary/chest: Effort normal Neurologic: Patient is alert Skin: Skin is warm Psychiatric: Patient has normal mood and affect  Ortho Exam: Ortho exam demonstrates right shoulder with 50 degrees external rotation, 80 degrees abduction, 80 degrees forward flexion.  Deltoid is firing on exam today.  Passive range of motion is significantly decreased compared with contralateral side.  He has no supraspinatus strength.  He has excellent strength of the infraspinatus.  Mild weakness of the subscapularis.  5/5 motor strength of the bilateral grip, finger abduction, pronation, supination, bicep, tricep.  Specialty Comments:  No specialty comments available.  Imaging: No results found.   PMFS History: Patient Active Problem List   Diagnosis Date Noted  . Cough 07/29/2019  . Wheezing 07/29/2019  .  Possible exposure to STD 07/24/2019  . Unilateral primary osteoarthritis, left hip 06/06/2019  . Status post total replacement of left hip 06/06/2019  . S/P laparoscopic sleeve gastrectomy 03/03/2019  . Rash 01/24/2019  . Fever 08/15/2018  . UTI (urinary tract infection) 08/15/2018  . Fall at home, initial encounter 08/15/2018  . Chronic anemia 08/15/2018  . Hypokalemia 08/15/2018  . Bowel incontinence 07/25/2018  . Other spondylosis with radiculopathy, lumbar region 07/16/2018    Class: Chronic  . Status post lumbar laminectomy 07/16/2018  . Status post THR (total hip replacement) 04/17/2018  . Unilateral primary osteoarthritis, right hip   . Morbid obesity with BMI of 40.0-44.9, adult ([place]) 02/28/2018  . Myocardial infarction ([place]) 02/25/2018  . Coronary artery disease involving native coronary artery of native heart 02/25/2018  . PAD (peripheral artery disease) ([place]) 02/25/2018  . S/P BKA (below knee amputation) unilateral, right ([place]) 02/25/2018  . Sacroiliitis ([place]) 02/25/2018  . SOB (shortness of breath) 01/03/2018  . Acute on chronic heart failure ([place]) 01/03/2018  . Severe right groin pain 12/20/2017  . Morbid obesity ([place]) 10/09/2017  . Low back pain 07/13/2017  . Leukoplakia, tongue 01/26/2017  . Hyperglycemia 10/20/2016  . Skin lesion 10/20/2016  . S/P unilateral BKA (below knee amputation), right ([place]) 06/14/2016  . Charcot foot due to diabetes mellitus ([place])   . Charcot's arthropathy associated with type 2 diabetes mellitus ([place]) 04/11/2016  . Preventative health care 11/05/2015  . Major depression 09/13/2015  . S/P TKR (total knee replacement) bilaterally 09/13/2015  . GERD (gastroesophageal reflux disease) 09/08/2015  . S/P laparoscopic hernia repair 05/11/2015  . PPD positive 04/08/2015  . Benign neoplasm of descending colon   . Benign neoplasm of cecum   . Acute blood loss anemia   . Chronic anticoagulation   . Occult blood positive stool 10/17/2014  .  General weakness 07/14/2014  . Urinary incontinence 07/14/2014  . Headache(784.0) 10/15/2013  . Spinal stenosis in cervical region 09/26/2013    Class: Chronic  . Spinal stenosis of lumbar region 09/26/2013    Class: Chronic  . Hand joint pain 06/10/2013  . Rotator cuff tear arthropathy of both shoulders 06/10/2013  . Skin lesion of cheek 05/01/2013  . Pain of right thumb 04/03/2013  . Balance disorder 03/12/2013  . Gait disorder 03/12/2013  . Tremor 03/12/2013  . Left hip pain 03/12/2013  . Pre-ulcerative corn or callous 02/06/2013  . Anxiety 11/12/2011  . OSA on CPAP 11/07/2011  . Bradycardia 10/20/2011  . Insomnia 10/04/2011  . Obesity 01/12/2011  . Diabetes mellitus type 2 in obese ([place]) 09/27/2010  . ERECTILE DYSFUNCTION, ORGANIC 05/30/2010  . [place] DISEASE, LUMBAR 04/19/2010  . SCIATICA, LEFT 04/19/2010  . Chronic pain syndrome 10/27/2009  . Hyperlipidemia 07/15/2009  . Essential hypertension 06/24/2009  . Coronary artery disease involving native coronary artery of native heart without angina pectoris  06/24/2009  . Allergic rhinitis 06/24/2009  . URETHRAL STRICTURE 06/24/2009  . DEGENERATIVE JOINT DISEASE 06/24/2009  . SHOULDER PAIN, BILATERAL 06/24/2009  . FATIGUE 06/24/2009  . NEPHROLITHIASIS, HX OF 06/24/2009   Past Medical History:  Diagnosis Date  . ALLERGIC RHINITIS 06/24/2009  . Allergy   . Anemia    IDA  . Anxiety 11/12/2011   Adequate for discharge   . Arthritis    "all my joints" (09/30/2013)  . Arthritis of foot, right, degenerative 04/15/2014  . Balance disorder 03/12/2013  . Benign neoplasm of cecum   . Benign neoplasm of descending colon   . CAD (coronary artery disease) 06/24/2009   5 stents placed in 2007    . Chronic anticoagulation   . Chronic pain syndrome 10/27/2009   of ankle, shoulders, low back.  sciatica.   . Closed fracture of right foot 10/17/2014  . CORONARY ARTERY DISEASE 06/24/2009   a. s/p multiple PCIs - In 2008 he had a Taxus DES to  the mild LAD, Endeavor DES to mid LCX and distal LCX. In January 2009 he had DES to distal LCX, mid LCX and proximal LCX. In November 2009 had BMS x 2 to the mid RCA. Cath 10/2011 with patent stents, noncardiac CP. LHC 01/2013: patent stents (noncardiac CP).  . DEGENERATIVE JOINT DISEASE 06/24/2009   Qualifier: Diagnosis of  By: [name], [name]   . Depression   . Depression with anxiety    Prior suicide attempt(08/25/19-pt states not suicide attempt)  . [place] DISEASE, LUMBAR 04/19/2010  . ERECTILE DYSFUNCTION, ORGANIC 05/30/2010  . Essential hypertension 06/24/2009   Qualifier: Diagnosis of  By: [name], [name]   . Fibromyalgia   . Fracture dislocation of ankle joint 09/02/2015  . Gait disorder 03/12/2013  . General weakness 07/14/2014  . GERD (gastroesophageal reflux disease) 09/08/2015   08/25/15-pt states was cardiac origin, not GERD  . Hand joint pain 06/10/2013  . Heart murmur   . Hepatitis C    treated pt. unknown with what he was a teenager  . History of kidney stones   . Hyperlipidemia 07/15/2009   Qualifier: Diagnosis of  By: Aundra Dubin, MD, [place]    . HYPERLIPIDEMIA-MIXED 07/15/2009   08/25/19-pt state cholesterol was normal range  . HYPERTENSION 06/24/2009  . Insomnia 10/04/2011  . Irregular heart beat   . Left hip pain 03/12/2013   Injected under ultrasound guidance on June 24, 2013   . Major depression 09/13/2015  . Myocardial infarction ([place]) 2008  . Non-cardiac chest pain 10/2011, 01/2013  . Obesity   . Occult blood positive stool 10/17/2014  . Open ankle fracture 09/02/2015  . OSA (obstructive sleep apnea)    not using CPAP (09/30/2013)  . Pain of right thumb 04/03/2013  . Pneumonia   . PPD positive 04/08/2015  . Pre-ulcerative corn or callous 02/06/2013  . Rotator cuff tear arthropathy of both shoulders 06/10/2013   History of bilateral shoulder cuff surgery for rotator cuff tears. Reports increase in pain 09/11/2015 during physical therapy of the left shoulder.   [name] SCIATICA, LEFT  04/19/2010   Qualifier: Diagnosis of  By: [name], [name]   . Sleep apnea    wears cpap 08/25/19-States not using due to nasal stuffiness.  [name] Spinal stenosis in cervical region 09/26/2013  . Spinal stenosis, lumbar region, with neurogenic claudication 09/26/2013  . Type II diabetes mellitus ([place]) 2012   no meds in 09/2014.   [name] Uncontrolled type 2 DM with peripheral circulatory  disorder ([place]) 10/04/2013   08/25/19- pt states A1C normal, no diabetes  . URETHRAL STRICTURE 06/24/2009   self catheterizes.     Family History  Problem Relation Age of Onset  . Depression Mother   . Heart disease Mother   . Hypertension Mother   . Breast cancer Mother   . Cancer Mother   . Stomach cancer Mother   . Esophageal cancer Mother   . Pancreatic cancer Mother   . Diabetes Father   . Heart disease Father        CABG  . Hypertension Father   . Hyperlipidemia Father   . Prostate cancer Father   . Skin cancer Father   . Depression Brother        x 2  . Hypertension Brother        x2  . Colon polyps Brother   . Healthy Son   . Heart disease Maternal Grandfather   . Early death Maternal Grandfather   . Heart attack Maternal Grandfather 08/07/63  . Early death Paternal Grandfather   . Coronary artery disease Other   . Hypertension Other   . Depression Other   . Healthy Son   . Colon cancer Neg Hx   . Rectal cancer Neg Hx     Past Surgical History:  Procedure Laterality Date  . AMPUTATION Right 06/14/2016   Procedure: AMPUTATION BELOW KNEE;  Surgeon: Newt Minion, MD;  Location: [place];  Service: Orthopedics;  Laterality: Right;  . ANKLE FUSION Right 04/15/2014   Procedure: Right Subtalar, Talonavicular Fusion;  Surgeon: Newt Minion, MD;  Location: [place];  Service: Orthopedics;  Laterality: Right;  . ANKLE FUSION Right 04/18/2016   Procedure: Right Ankle Tibiocalcaneal Fusion;  Surgeon: Newt Minion, MD;  Location: [place] [place];  Service: Orthopedics;  Laterality: Right;  . ANTERIOR CERVICAL  DECOMP/DISCECTOMY FUSION N/A 09/26/2013   Procedure: ANTERIOR CERVICAL DISCECTOMY FUSION C3-4, plate and screw fixation, allograft bone graft;  Surgeon: Jessy Oto, MD;  Location: [place];  Service: Orthopedics;  Laterality: N/A;  . BACK SURGERY     3  . BELOW KNEE LEG AMPUTATION Right 06/14/2016   right ankle and foot  . CARDIAC CATHETERIZATION  X 1  . CARPAL TUNNEL RELEASE Bilateral   . COLONOSCOPY N/A 10/22/2014   Procedure: COLONOSCOPY;  Surgeon: Lafayette Dragon, MD;  Location[facility];  Service: Endoscopy;  Laterality: N/A;  . COLONOSCOPY  11/19/2018  . CORONARY ANGIOPLASTY WITH STENT PLACEMENT     "I have 9 stents"  . ESOPHAGOGASTRODUODENOSCOPY N/A 10/19/2014   Procedure: ESOPHAGOGASTRODUODENOSCOPY (EGD);  Surgeon: Jerene Bears, MD;  Location[facility] ENDOSCOPY;  Service: Endoscopy;  Laterality: N/A;  . FRACTURE SURGERY    . FUSION OF TALONAVICULAR JOINT Right 04/15/2014   dr duda  . GASTRIC BYPASS  02/20/2019  . HERNIA REPAIR     umbilical  . INGUINAL HERNIA REPAIR Right 05/11/2015   Procedure: LAPAROSCOPIC REPAIR RIGHT  INGUINAL HERNIA;  Surgeon: Greer Pickerel, MD;  Location: [place];  Service: General;  Laterality: Right;  . INSERTION OF MESH Right 05/11/2015   Procedure: INSERTION OF MESH;  Surgeon: Greer Pickerel, MD;  Location: [place];  Service: General;  Laterality: Right;  . JOINT REPLACEMENT    . KNEE CARTILAGE SURGERY Right X 12   "~ 1/2 open; ~ 1/2 scopes"  . KNEE CARTILAGE SURGERY Left X 3   "3 scopes"  . LAPAROSCOPIC GASTRIC SLEEVE RESECTION N/A 03/03/2019   Procedure: LAPAROSCOPIC GASTRIC  SLEEVE RESECTION, Upper Endo, ERAS Pathway;  Surgeon: Greer Pickerel, MD;  Location: [facility];  Service: General;  Laterality: N/A;  . LEFT HEART CATHETERIZATION WITH CORONARY ANGIOGRAM N/A 02/10/2013   Procedure: LEFT HEART CATHETERIZATION WITH CORONARY ANGIOGRAM;  Surgeon: Burnell Blanks, MD;  Location: [facility] CATH LAB;  Service: Cardiovascular;  Laterality: N/A;  . LUMBAR LAMINECTOMY N/A  07/16/2018   Procedure: LEFT L4-5 REDO PARTIAL LUMBAR HEMILAMINECTOMY WITH FORAMINOTOMY LEFT L4;  Surgeon: Jessy Oto, MD;  Location: [place];  Service: Orthopedics;  Laterality: N/A;  . LUMBAR LAMINECTOMY/DECOMPRESSION MICRODISCECTOMY N/A 01/27/2014   Procedure: CENTRAL LUMBAR LAMINECTOMY L4-5 AND L3-4;  Surgeon: Jessy Oto, MD;  Location: [place];  Service: Orthopedics;  Laterality: N/A;  . ORIF ANKLE FRACTURE Right 09/02/2015   Procedure: OPEN REDUCTION INTERNAL FIXATION (ORIF) ANKLE FRACTURE;  Surgeon: Newt Minion, MD;  Location: [place];  Service: Orthopedics;  Laterality: Right;  . PERIPHERALLY INSERTED CENTRAL CATHETER INSERTION  09/02/2015  . POLYPECTOMY    . SHOULDER ARTHROSCOPY W/ ROTATOR CUFF REPAIR Bilateral    "3 on the right; 1 on the left"  . SKIN SPLIT GRAFT Right 10/01/2015   Procedure: RIGHT ANKLE APPLY SKIN GRAFT SPLIT THICKNESS;  Surgeon: Newt Minion, MD;  Location: [place];  Service: Orthopedics;  Laterality: Right;  . TONSILLECTOMY    . TOOTH EXTRACTION    . TOTAL HIP ARTHROPLASTY Right 04/17/2018  . TOTAL HIP ARTHROPLASTY Right 04/17/2018   Procedure: RIGHT TOTAL HIP ARTHROPLASTY;  Surgeon: Newt Minion, MD;  Location: [place];  Service: Orthopedics;  Laterality: Right;  . TOTAL HIP ARTHROPLASTY Left 06/06/2019   Procedure: LEFT TOTAL HIP ARTHROPLASTY ANTERIOR APPROACH;  Surgeon: Mcarthur Rossetti, MD;  Location: [place];  Service: Orthopedics;  Laterality: Left;  . TOTAL HIP REVISION Left 05/24/2019  . TOTAL KNEE ARTHROPLASTY Bilateral 2008  . UMBILICAL HERNIA REPAIR     UHR  . UPPER GASTROINTESTINAL ENDOSCOPY  2016  . URETHRAL DILATION  X 4  . VASECTOMY    . WISDOM TOOTH EXTRACTION     Social History   Occupational History  . Occupation: disabled since 2006 due to ortho. heart, psych    Employer: UNEMPLOYED  . Occupation: part time work as an Multimedia programmer, wrestling, and Holiday representative  Tobacco Use  . Smoking status: Former Smoker      Types: Cigars    Quit date: 08/28/2010    Years since quitting: 9.3  . Smokeless tobacco: Never Used  . Tobacco comment: 04/18/2016 "smoked 1 cigar/wk when I did smoke"  Vaping Use  . Vaping Use: Never used  Substance and Sexual Activity  . Alcohol use: Not Currently    Alcohol/week: 0.0 standard drinks    Comment: rarely  . Drug use: No  . Sexual activity: Not Currently

## 2020-01-20 ENCOUNTER — Ambulatory Visit (INDEPENDENT_AMBULATORY_CARE_PROVIDER_SITE_OTHER): Payer: Medicare HMO | Admitting: Physician Assistant

## 2020-01-20 ENCOUNTER — Other Ambulatory Visit: Payer: Self-pay

## 2020-01-20 ENCOUNTER — Encounter: Payer: Self-pay | Admitting: Physician Assistant

## 2020-01-20 ENCOUNTER — Encounter: Payer: Medicare HMO | Attending: General Surgery | Admitting: Skilled Nursing Facility1

## 2020-01-20 VITALS — BP 136/50 | HR 59 | Ht 72.0 in | Wt 301.0 lb

## 2020-01-20 DIAGNOSIS — E785 Hyperlipidemia, unspecified: Secondary | ICD-10-CM | POA: Diagnosis not present

## 2020-01-20 DIAGNOSIS — Z01810 Encounter for preprocedural cardiovascular examination: Secondary | ICD-10-CM | POA: Diagnosis not present

## 2020-01-20 DIAGNOSIS — Z7982 Long term (current) use of aspirin: Secondary | ICD-10-CM | POA: Insufficient documentation

## 2020-01-20 DIAGNOSIS — I2510 Atherosclerotic heart disease of native coronary artery without angina pectoris: Secondary | ICD-10-CM

## 2020-01-20 DIAGNOSIS — Z7984 Long term (current) use of oral hypoglycemic drugs: Secondary | ICD-10-CM | POA: Diagnosis not present

## 2020-01-20 DIAGNOSIS — E669 Obesity, unspecified: Secondary | ICD-10-CM

## 2020-01-20 DIAGNOSIS — G4733 Obstructive sleep apnea (adult) (pediatric): Secondary | ICD-10-CM | POA: Insufficient documentation

## 2020-01-20 DIAGNOSIS — Z79899 Other long term (current) drug therapy: Secondary | ICD-10-CM | POA: Insufficient documentation

## 2020-01-20 DIAGNOSIS — E119 Type 2 diabetes mellitus without complications: Secondary | ICD-10-CM | POA: Insufficient documentation

## 2020-01-20 DIAGNOSIS — Z89619 Acquired absence of unspecified leg above knee: Secondary | ICD-10-CM | POA: Insufficient documentation

## 2020-01-20 DIAGNOSIS — I10 Essential (primary) hypertension: Secondary | ICD-10-CM

## 2020-01-20 DIAGNOSIS — Z713 Dietary counseling and surveillance: Secondary | ICD-10-CM | POA: Diagnosis not present

## 2020-01-20 MED ORDER — ASPIRIN 81 MG PO CHEW: 81.0000 mg | CHEWABLE_TABLET | Freq: Every day | ORAL | 0 refills | Status: DC

## 2020-01-20 NOTE — Progress Notes (Signed)
Follow-up visit:  Post-Operative sleeve Surgery  Primary concerns today: Post-operative Bariatric Surgery Nutrition Management 6 Month Post-Op Class  Anthropometrics (*all weights include prosthesis)  Start weight at [facility]: 317.8 lbs (date: 01/17/2018)  Today's weight: 300 lbs    Pt states he has not been well due to  his son having passed away so he has been emotionally eating.  Pt states he has learned he is eating larger and more calorically dense snacks and eating out of emotion stating he is realizing he does not nee to eat as much as he has been. Pt states he needs back and shoulder surgery which has been stressing him. Pt states he is taking prednisone which has significantly increased his appetite and cannot walk like he sued to do deal with the appetite feelings. Pt states he tried building model cars but does not ave a table to do it on. Pt states playing Internet games are helpful and he plays with other people online. Pt states he did start with a  Talk therapist which he does like.  Pt states he has been trying to buy healthier foods and not buy the junk food. Pt states his friend Zella Ball goes [employer] with him and stops him from buying calorically dense foods.  Pt states he fell again stating his prosthesis got caught on his bed needing EMS to help him up statng he he did his his mouth as well.    Goals: -continue to avoid buying calorically dense foods -work on Letting your guilt go -find a hobby -Work on cutting snack in half: for example half a sandwich in stead of whole and 1 hot pocket instead of 2 -get procare for your multivitamin   24 hr recall: Breakfast: english muffin + sausauge patty and milk (30 minutes later) 2.5-3 hours 2 hot pockets Lunch: meals on wheels 3pm: ham sandwich or cheese Dinner: 4 ounces meat and green beans or corn  Snack: popcorn  Beverages: water, milk  Fluid intake: adequate   Medications: See List Supplementation: appropriate  every other day  Using straws: no Drinking while eating: no Having you been chewing well: yes Chewing/swallowing difficulties: no Changes in vision: no Changes to mood/headaches: no Hair loss/Cahnges to skin/Changes to nails: no Any difficulty focusing or concentrating: no Sweating: no Dizziness/Lightheaded: no Palpitations: no  Carbonated beverages: no N/V/D/C/GAS: no Abdominal Pain: no Dumping syndrome: no  Recent physical activity:  ADL's due to pain  Progress Towards Goal(s):  In Progress   Teaching Method Utilized:  Visual Auditory Hands on  Demonstrated degree of understanding via:  Teach Back   Monitoring/Evaluation:  Dietary intake, exercise, and body weight. Follow up in 1 month

## 2020-01-20 NOTE — Patient Instructions (Addendum)
Medication Instructions:  Your physician recommends that you continue on your current medications as directed. Please refer to the Current Medication list given to you today.  *If you need a refill on your cardiac medications before your next appointment, please call your pharmacy*  Lab Work: None ordered today  If you have labs (blood work) drawn today and your tests are completely normal, you will receive your results only by: [name] MyChart Message (if you have MyChart) OR . A paper copy in the mail If you have any lab test that is abnormal or we need to change your treatment, we will call you to review the results.  Testing/Procedures: None ordered today  Follow-Up: On 07/26/2020 at 3:00PM with Lauree Chandler, MD

## 2020-01-20 NOTE — Progress Notes (Signed)
Cardiology Office Note:    Date:  01/20/2020   ID:  Travis Novak Sr., DOB [birth year], MRN [id number]  PCP:  [name], MD  [facility] Cardiologist:  Lauree Chandler, MD  [facility] Electrophysiologist:  None   Referring MD: [name], MD   Chief Complaint:  Surgical Clearance    Patient Profile:    Travis Felmlee. is a 69 y.o. male with:   Coronary artery disease ? s/p multiple PCI's  Taxus DES to the LAD in 2008, Endeavor DES to the LCx in 2008, BMS to the RCA in 2009, Xience DES to the LCx in 2009 ? Poor Plt inhibition w Plavix >> chronic DAPT 2/2 multiple stents (prasugrel, aspirin) ? Cath in 2014: Patent stents ? Myoview in November 2019: No ischemia  Chronic diastolic CHF  Diabetes mellitus  Hypertension  Hyperlipidemia  GERD  Hepatitis C  Depression/anxiety  Chronic pain  Fibromyalgia  S/p R BKA (traumatic amputation)  Prior CV studies: Myoview 03/26/2018 EF 47, no ischemia; Intermediate Risk (low EF)  Echocardiogram 01/09/2018 EF 29-93, grade 1 diastolic dysfunction, trivial MR, trivial TR, PASP 24  Cardiac catheterization 02/10/2013 Left main: No obstructive disease.  LAD: patent stent in the mid vessel without significant restenosis.  LCx: patents stents in the proximal and mid vessel and in the distal OM branch.  RCA:  patent proximal stents w 20-30% ISR, Mild plaque in the mid vessel.  LVEF 55%.    History of Present Illness:    Travis [name] was last seen in 05/2019 via Telemedicine.  He returns for surgical clearance.  He needs to undergo implantation of a spinal cord stimulator with [facility] (Dr. Fredric Mare).  He needs to be off of Prasugrel for a total of 17 days.  He is here alone.  Unfortunately, his 78 year old son died in his sleep from a myocardial infarction several months ago.  Up until 2 months ago, Travis [name] was fairly active, walking 2 miles a day.  He had lost a significant amount  of weight after bariatric surgery last year.  His prosthetic sleeve was loose and his prosthetic leg fell off while walking.  Since falling, he has not been able to resume walking.  But he has been fairly active at home.  He can vacuum will go up steps.  He does not have chest discomfort or significant shortness of breath.  He has not had orthopnea, significant leg swelling or syncope.      Past Medical History:  Diagnosis Date  . ALLERGIC RHINITIS 06/24/2009  . Allergy   . Anemia    IDA  . Anxiety 11/12/2011   Adequate for discharge   . Arthritis    "all my joints" (09/30/2013)  . Arthritis of foot, right, degenerative 04/15/2014  . Balance disorder 03/12/2013  . Benign neoplasm of cecum   . Benign neoplasm of descending colon   . CAD (coronary artery disease) 06/24/2009   5 stents placed in 2007    . Chronic anticoagulation   . Chronic pain syndrome 10/27/2009   of ankle, shoulders, low back.  sciatica.   . Closed fracture of right foot 10/17/2014  . CORONARY ARTERY DISEASE 06/24/2009   a. s/p multiple PCIs - In 2008 he had a Taxus DES to the mild LAD, Endeavor DES to mid LCX and distal LCX. In January 2009 he had DES to distal LCX, mid LCX and proximal LCX. In November 2009 had BMS x 2 to the  mid RCA. Cath 10/2011 with patent stents, noncardiac CP. LHC 01/2013: patent stents (noncardiac CP).  . DEGENERATIVE JOINT DISEASE 06/24/2009   Qualifier: Diagnosis of  By: [name], [name]   . Depression   . Depression with anxiety    Prior suicide attempt(08/25/19-pt states not suicide attempt)  . [place] DISEASE, LUMBAR 04/19/2010  . ERECTILE DYSFUNCTION, ORGANIC 05/30/2010  . Essential hypertension 06/24/2009   Qualifier: Diagnosis of  By: [name], [name]   . Fibromyalgia   . Fracture dislocation of ankle joint 09/02/2015  . Gait disorder 03/12/2013  . General weakness 07/14/2014  . GERD (gastroesophageal reflux disease) 09/08/2015   08/25/15-pt states was cardiac origin, not GERD  . Hand joint pain 06/10/2013   . Heart murmur   . Hepatitis C    treated pt. unknown with what he was a teenager  . History of kidney stones   . Hyperlipidemia 07/15/2009   Qualifier: Diagnosis of  By: Aundra Dubin, MD, [place]    . HYPERLIPIDEMIA-MIXED 07/15/2009   08/25/19-pt state cholesterol was normal range  . HYPERTENSION 06/24/2009  . Insomnia 10/04/2011  . Irregular heart beat   . Left hip pain 03/12/2013   Injected under ultrasound guidance on June 24, 2013   . Major depression 09/13/2015  . Myocardial infarction ([place]) 2008  . Non-cardiac chest pain 10/2011, 01/2013  . Obesity   . Occult blood positive stool 10/17/2014  . Open ankle fracture 09/02/2015  . OSA (obstructive sleep apnea)    not using CPAP (09/30/2013)  . Pain of right thumb 04/03/2013  . Pneumonia   . PPD positive 04/08/2015  . Pre-ulcerative corn or callous 02/06/2013  . Rotator cuff tear arthropathy of both shoulders 06/10/2013   History of bilateral shoulder cuff surgery for rotator cuff tears. Reports increase in pain 09/11/2015 during physical therapy of the left shoulder.   [name] SCIATICA, LEFT 04/19/2010   Qualifier: Diagnosis of  By: [name], [name]   . Sleep apnea    wears cpap 08/25/19-States not using due to nasal stuffiness.  [name] Spinal stenosis in cervical region 09/26/2013  . Spinal stenosis, lumbar region, with neurogenic claudication 09/26/2013  . Type II diabetes mellitus ([place]) 2012   no meds in 09/2014.   [name] Uncontrolled type 2 DM with peripheral circulatory disorder ([place]) 10/04/2013   08/25/19- pt states A1C normal, no diabetes  . URETHRAL STRICTURE 06/24/2009   self catheterizes.     Current Medications: Current Meds  Medication Sig  . albuterol (VENTOLIN HFA) 108 (90 Base) MCG/ACT inhaler Inhale 2 puffs into the lungs every 6 (six) hours as needed for wheezing or shortness of breath.  [name] amoxicillin (AMOXIL) 500 MG capsule daily as needed. AS NEEDED FOR DENTAL WORK  . ARIPiprazole (ABILIFY) 5 MG tablet Take 1 tablet (5 mg total) by mouth at  bedtime.  [name] aspirin 81 MG chewable tablet Chew 1 tablet (81 mg total) by mouth daily.  [name] atorvastatin (LIPITOR) 20 MG tablet Take 1 tablet (20 mg total) by mouth daily.  . benztropine (COGENTIN) 0.5 MG tablet Take 1 tablet (0.5 mg total) by mouth at bedtime.  . calcium carbonate (TUMS - DOSED IN MG ELEMENTAL CALCIUM) 500 MG chewable tablet Chew 1,000 mg by mouth at bedtime.  . Calcium Polycarbophil (FIBER-CAPS PO) Take 3 capsules by mouth at bedtime.  . celecoxib (CELEBREX) 200 MG capsule Take 1 capsule (200 mg total) by mouth 2 (two) times daily.  . diclofenac Sodium (VOLTAREN) 1 % GEL Apply 4  g topically 3 (three) times daily as needed (pain).  . DULoxetine (CYMBALTA) 30 MG capsule Take 1 capsule (30 mg total) by mouth 2 (two) times daily.  [name] gabapentin (NEURONTIN) 800 MG tablet Take 800 mg by mouth 3 (three) times daily.   . hydrochlorothiazide (HYDRODIURIL) 25 MG tablet Take 1 tablet (25 mg total) by mouth daily.  [name] HYDROcodone-acetaminophen (NORCO) 10-325 MG tablet Take 1 tablet by mouth every 8 (eight) hours as needed for moderate pain.   . iron polysaccharides (NU-IRON) 150 MG capsule Take 1 capsule (150 mg total) by mouth daily.  . Melatonin 5 MG TABS Take 20 mg by mouth at bedtime.   . methocarbamol (ROBAXIN) 500 MG tablet Take 1 tablet (500 mg total) by mouth every 8 (eight) hours as needed for muscle spasms.  . Multiple Vitamins-Minerals (BARIATRIC MULTIVITAMINS/IRON PO) Take 1 tablet by mouth daily.  [name] MYRBETRIQ 50 MG TB24 tablet Take 50 mg by mouth at bedtime.   . Omega-3 Fatty Acids (FISH OIL) 1000 MG CAPS Take 1,000 mg by mouth at bedtime.   [name] oxybutynin (DITROPAN-XL) 10 MG 24 hr tablet Take 10 mg by mouth daily.  [name] oxyCODONE (OXY IR/ROXICODONE) 5 MG immediate release tablet Take 1-2 tablets (5-10 mg total) by mouth every 4 (four) hours as needed for moderate pain (pain score 4-6).  [name] oxymetazoline (AFRIN) 0.05 % nasal spray Place 1 spray into both nostrils 2 (two) times daily as  needed for congestion.  . Pancrelipase, Lip-Prot-Amyl, (ZENPEP) 15000-47000 units CPEP Take 2 capsules with meals and 1 capsule with snacks  . pantoprazole (PROTONIX) 40 MG tablet Take 1 tablet (40 mg total) by mouth 2 (two) times daily.  . prasugrel (EFFIENT) 5 MG TABS tablet Take 1 tablet (5 mg total) by mouth daily.  . predniSONE (DELTASONE) 10 MG tablet Take 2 tablets (20 mg total) by mouth daily with breakfast.  . sildenafil (VIAGRA) 100 MG tablet Take 100 mg by mouth daily as needed for erectile dysfunction.   . Testosterone 1.62 % GEL Place 1 Pump onto the skin daily.  . traZODone (DESYREL) 150 MG tablet Take 1 tablet (150 mg total) by mouth at bedtime.  . [DISCONTINUED] aspirin 81 MG chewable tablet Chew 1 tablet (81 mg total) by mouth 2 (two) times daily.     Allergies:   Patient has no known allergies.   Social History   Tobacco Use  . Smoking status: Former Smoker    Types: Cigars    Quit date: 08/28/2010    Years since quitting: 9.4  . Smokeless tobacco: Never Used  . Tobacco comment: 04/18/2016 "smoked 1 cigar/wk when I did smoke"  Vaping Use  . Vaping Use: Never used  Substance Use Topics  . Alcohol use: Not Currently    Alcohol/week: 0.0 standard drinks    Comment: rarely  . Drug use: No     Family Hx: The patient's family history includes Breast cancer in his mother; Cancer in his mother; Colon polyps in his brother; Coronary artery disease in an other family member; Depression in his brother, mother, and another family member; Diabetes in his father; Early death in his maternal grandfather and paternal grandfather; Esophageal cancer in his mother; Healthy in his son and son; Heart attack (age of onset: 81) in his maternal grandfather; Heart disease in his father, maternal grandfather, and mother; Hyperlipidemia in his father; Hypertension in his brother, father, mother, and another family member; Pancreatic cancer in his mother; Prostate cancer in his father;  Skin  cancer in his father; Stomach cancer in his mother. There is no history of Colon cancer or Rectal cancer.  ROS   EKGs/Labs/Other Test Reviewed:    EKG:  EKG is   ordered today.  The ekg ordered today demonstrates sinus bradycardia, HR 59, normal axis, no ST-T wave changes, QTC 386, similar to prior tracing  Recent Labs: 07/24/2019: ALT 11; BUN 25; Creatinine, Ser 1.04; Potassium 3.9; Sodium 141; TSH 1.58 11/26/2019: Hemoglobin 13.9; Platelets 224.0   Recent Lipid Panel Lab Results  Component Value Date/Time   CHOL 108 07/24/2019 02:47 PM   TRIG 92.0 07/24/2019 02:47 PM   HDL 40.90 07/24/2019 02:47 PM   CHOLHDL 3 07/24/2019 02:47 PM   LDLCALC 49 07/24/2019 02:47 PM    Physical Exam:    VS:  BP (!) 136/50   Pulse (!) 59   Ht 6' (1.829 m)   Wt (!) 301 lb (136.5 kg)   SpO2 96%   BMI 40.82 kg/m     Wt Readings from Last 3 Encounters:  01/20/20 (!) 301 lb (136.5 kg)  01/20/20 300 lb (136.1 kg)  12/16/19 (!) 291 lb (132 kg)     Constitutional:      Appearance: Healthy appearance. Not in distress.  Pulmonary:     Effort: Pulmonary effort is normal.     Breath sounds: No wheezing. No rales.  Cardiovascular:     Normal rate. Regular rhythm. Normal S1. Normal S2.     Murmurs: There is a grade 1/6 systolic murmur at the URSB.  Edema:    Pretibial: trace edema of the left pretibial area. Abdominal:     Palpations: Abdomen is soft.  Musculoskeletal:     Cervical back: Neck supple.     Right Lower Extremity: Right leg is amputated below knee. Skin:    General: Skin is warm and dry.  Neurological:     General: No focal deficit present.     Mental Status: Alert and oriented to person, place and time.     Cranial Nerves: Cranial nerves are intact.      ASSESSMENT & PLAN:    1. Preoperative cardiovascular examination   Travis [name] perioperative risk of a major cardiac event is 6.6% according to the Revised Cardiac Risk Index (RCRI).  Therefore, he is at high risk for  perioperative complications.   His functional capacity is fair at 4.73 METs according to the Duke Activity Status Index (DASI). Recommendations: According to ACC/AHA guidelines, no further cardiovascular testing needed.  The patient may proceed to surgery at acceptable risk.   Antiplatelet and/or Anticoagulation Recommendations: Aspirin can be held for 7 days prior to his surgery.  Please resume Aspirin post operatively when it is felt to be safe from a bleeding standpoint.  Prasugrel (Effient) can be held for 7 days prior to his surgery and resumed as soon as possible post op.  2. Coronary artery disease involving native coronary artery of native heart without angina pectoris History of multiple prior stents to the LAD, LCx and RCA.  He is a nonresponder to clopidogrel.  He has remained on prasugrel plus aspirin.  Cardiac catheterization 2014 demonstrated patent stents and no significant disease elsewhere.  Nuclear stress test in 2019 was negative for ischemia.  EF was normal by echocardiogram in 8/19.  He is doing well without anginal symptoms.  Continue aspirin, atorvastatin, prasugrel.  Follow-up with Dr. Angelena Form in 6 months.  3. Essential hypertension The patient's blood pressure is controlled on his  current regimen.  Continue current therapy.   4. Hyperlipidemia, unspecified hyperlipidemia type LDL optimal on most recent lab work.  Continue current Rx.      Dispo:  Return in about 6 months (around 07/19/2020) for Routine Follow Up w/ Dr. Angelena Form, in person.   Medication Adjustments/Labs and Tests Ordered: Current medicines are reviewed at length with the patient today.  Concerns regarding medicines are outlined above.  Tests Ordered: Orders Placed This Encounter  Procedures  . EKG 12-Lead   Medication Changes: Meds ordered this encounter  Medications  . aspirin 81 MG chewable tablet    Sig: Chew 1 tablet (81 mg total) by mouth daily.    Dispense:  35 tablet    Refill:  0     Order Specific Question:   Supervising Provider    Answer:   Lelon Perla [[id number]]    Signed, Richardson Dopp, PA-C  01/20/2020 4:40 PM    [facility] [address], [place], [place]  [address] Phone: [phone]; Fax: [phone]

## 2020-01-21 ENCOUNTER — Telehealth: Payer: Self-pay | Admitting: Skilled Nursing Facility1

## 2020-01-21 NOTE — Telephone Encounter (Signed)
Pt asked if he should jump start his diet by going back to a all liquid diet.  Dietitian advised against this. Pt was agreeable.

## 2020-01-23 ENCOUNTER — Ambulatory Visit (INDEPENDENT_AMBULATORY_CARE_PROVIDER_SITE_OTHER): Payer: Medicare HMO | Admitting: Psychology

## 2020-01-23 DIAGNOSIS — F332 Major depressive disorder, recurrent severe without psychotic features: Secondary | ICD-10-CM | POA: Diagnosis not present

## 2020-01-28 ENCOUNTER — Other Ambulatory Visit: Payer: Self-pay | Admitting: Internal Medicine

## 2020-01-28 ENCOUNTER — Other Ambulatory Visit: Payer: Self-pay

## 2020-01-28 ENCOUNTER — Ambulatory Visit (INDEPENDENT_AMBULATORY_CARE_PROVIDER_SITE_OTHER): Payer: Medicare HMO | Admitting: Specialist

## 2020-01-28 ENCOUNTER — Other Ambulatory Visit: Payer: Self-pay | Admitting: Physician Assistant

## 2020-01-28 ENCOUNTER — Ambulatory Visit (INDEPENDENT_AMBULATORY_CARE_PROVIDER_SITE_OTHER): Payer: Medicare HMO

## 2020-01-28 ENCOUNTER — Encounter: Payer: Self-pay | Admitting: Specialist

## 2020-01-28 VITALS — BP 154/76 | HR 62 | Ht 72.0 in | Wt 301.0 lb

## 2020-01-28 DIAGNOSIS — M48062 Spinal stenosis, lumbar region with neurogenic claudication: Secondary | ICD-10-CM | POA: Diagnosis not present

## 2020-01-28 DIAGNOSIS — M5441 Lumbago with sciatica, right side: Secondary | ICD-10-CM | POA: Diagnosis not present

## 2020-01-28 DIAGNOSIS — M47816 Spondylosis without myelopathy or radiculopathy, lumbar region: Secondary | ICD-10-CM

## 2020-01-28 DIAGNOSIS — G8929 Other chronic pain: Secondary | ICD-10-CM

## 2020-01-28 DIAGNOSIS — M47818 Spondylosis without myelopathy or radiculopathy, sacral and sacrococcygeal region: Secondary | ICD-10-CM

## 2020-01-28 DIAGNOSIS — M5442 Lumbago with sciatica, left side: Secondary | ICD-10-CM

## 2020-01-28 DIAGNOSIS — M2170 Unequal limb length (acquired), unspecified site: Secondary | ICD-10-CM

## 2020-01-28 MED ORDER — METHYLPREDNISOLONE 4 MG PO TABS: ORAL_TABLET | ORAL | 0 refills | Status: DC

## 2020-01-28 NOTE — Progress Notes (Signed)
Office Visit Note   Patient: Travis [name] Sr.           Date of Birth: Oct 15, 1950           MRN: [id number] Visit Date: 01/28/2020              Requested by: [name], MD [place],  [place] [address] PCP: [name], MD   Assessment & Plan: Visit Diagnoses:  1. Chronic bilateral low back pain with bilateral sciatica   2. Spinal stenosis of lumbar region with neurogenic claudication   3. Arthropathy of left sacroiliac joint   4. Spondylosis without myelopathy or radiculopathy, lumbar region   5. Acquired leg length discrepancy     Plan: Avoid bending, stooping and avoid lifting weights greater than 10 lbs. Avoid prolong standing and walking. Avoid frequent bending and stooping  No lifting greater than 10 lbs. May use ice or moist heat for pain. Weight loss is of benefit. Handicap license is approved. Dr. Romona Curls secretary/Assistant will call to arrange for left SI joint injection to help determine how much pain is due to the left Sacroiliac joint Lengthen the right leg 45m due to leg length discrepancy.   Follow-Up Instructions: Return in about 3 weeks (around 02/18/2020).   Orders:  Orders Placed This Encounter  Procedures  . XR Lumb Spine Flex&Ext Only  . MR Lumbar Spine W Wo Contrast  . Ambulatory referral to Physical Medicine Rehab   Meds ordered this encounter  Medications  . methylPREDNISolone (MEDROL) 4 MG tablet    Sig: Take 1 tablet (4 mg total) by mouth daily for 14 days, THEN 0.5 tablets (2 mg total) daily for 14 days.    Dispense:  21 tablet    Refill:  0      Procedures: No procedures performed   Clinical Data: No additional findings.   Subjective: Chief Complaint  Patient presents with  . Lower Back - Pain    69year old male with history of lumbar spinal stenosis post lumbar fusion surgery and lumbar decompression. He has bilateral total knee arthroplasties in the past and he is having problems with increasing  left buttock pain with standing and walking. His walking is limited to 100 yards. He is having pain with sitting as well. Pain in the left upper buttock with radiation into the left mid buttock. There is pain with sitting on the left buttock and with walking. He leans on the cart with  Grocery shopping. No bowel or bladder dysfunction. He has some pain relief with sitting  And  Does feel weak with prolong standing and walking. Last surgery was Left L4 foramenotomy with  Left L4-5 hemilaminectomy 2 /2020. He has since had gastric sleeve surgery 02/2019, Left total  Hip replacement and RFA of the facets lumbar spine by Dr. PPosey Pronto He is seeing Dr. DMarlou Sato consider a right reverse shoulder arthroplasty. He reports his son recently passed away 607/05/21 Due to massive AMI. Take Hydrocodone tablets 3 per day. Takes gabapentin and celebrex 200 BID. He is still hurting and having night pain and sleeping only 3 hours per day.    Review of Systems  Constitutional: Positive for activity change and unexpected weight change.  HENT: Positive for sinus pressure and sinus pain.   Eyes: Negative.   Respiratory: Positive for apnea (CPAP at night) and shortness of breath.   Cardiovascular: Negative.  Negative for chest pain, palpitations and leg swelling.  Gastrointestinal: Negative.  Endocrine: Negative.   Genitourinary: Negative.   Musculoskeletal: Positive for back pain and gait problem.  Skin: Negative.  Negative for color change, pallor, rash and wound.  Allergic/Immunologic: Negative.  Negative for environmental allergies, food allergies and immunocompromised state.  Neurological: Positive for weakness. Negative for dizziness, tremors, seizures, syncope, facial asymmetry, speech difficulty, light-headedness, numbness and headaches.  Hematological: Negative.   Psychiatric/Behavioral: Positive for sleep disturbance. Negative for agitation, behavioral problems, confusion, decreased concentration, dysphoric  mood, hallucinations, self-injury and suicidal ideas. The patient is not nervous/anxious and is not hyperactive.      Objective: Vital Signs: BP (!) 154/76 (BP Location: Left Arm, Patient Position: Sitting)   Pulse 62   Ht 6' (1.829 m)   Wt (!) 301 lb (136.5 kg)   BMI 40.82 kg/m   Physical Exam Constitutional:      Appearance: He is well-developed.  HENT:     Head: Normocephalic and atraumatic.  Eyes:     Pupils: Pupils are equal, round, and reactive to light.  Pulmonary:     Effort: Pulmonary effort is normal.     Breath sounds: Normal breath sounds.  Abdominal:     General: Bowel sounds are normal.     Palpations: Abdomen is soft.  Musculoskeletal:     Cervical back: Normal range of motion and neck supple.  Skin:    General: Skin is warm and dry.  Neurological:     Mental Status: He is alert and oriented to person, place, and time.  Psychiatric:        Behavior: Behavior normal.        Thought Content: Thought content normal.        Judgment: Judgment normal.     Back Exam   Tenderness  The patient is experiencing tenderness in the lumbar.  Range of Motion  Extension: abnormal  Flexion: abnormal  Lateral bend right: abnormal  Lateral bend left: abnormal  Rotation right: abnormal  Rotation left: abnormal   Muscle Strength  Right Quadriceps:  5/5  Left Quadriceps:  5/5  Left Hamstrings:  5/5   Reflexes  Patellar: 0/4 Achilles: 0/4  Other  Toe walk: abnormal Heel walk: abnormal Sensation: decreased Scars: present      Specialty Comments:  No specialty comments available.  Imaging: No results found.   PMFS History: Patient Active Problem List   Diagnosis Date Noted  . Other spondylosis with radiculopathy, lumbar region 07/16/2018    Priority: High    Class: Chronic  . Spinal stenosis in cervical region 09/26/2013    Priority: High    Class: Chronic  . Spinal stenosis of lumbar region 09/26/2013    Priority: High    Class: Chronic    . Cough 07/29/2019  . Wheezing 07/29/2019  . Possible exposure to STD 07/24/2019  . Unilateral primary osteoarthritis, left hip 06/06/2019  . Status post total replacement of left hip 06/06/2019  . S/P laparoscopic sleeve gastrectomy 03/03/2019  . Rash 01/24/2019  . Fever 08/15/2018  . UTI (urinary tract infection) 08/15/2018  . Fall at home, initial encounter 08/15/2018  . Chronic anemia 08/15/2018  . Hypokalemia 08/15/2018  . Bowel incontinence 07/25/2018  . Status post lumbar laminectomy 07/16/2018  . Status post THR (total hip replacement) 04/17/2018  . Unilateral primary osteoarthritis, right hip   . Morbid obesity with BMI of 40.0-44.9, adult ([place]) 02/28/2018  . Myocardial infarction ([place]) 02/25/2018  . Coronary artery disease involving native coronary artery of native heart 02/25/2018  . PAD (peripheral  artery disease) ([place]) 02/25/2018  . S/P BKA (below knee amputation) unilateral, right ([place]) 02/25/2018  . Sacroiliitis ([place]) 02/25/2018  . SOB (shortness of breath) 01/03/2018  . Acute on chronic heart failure ([place]) 01/03/2018  . Severe right groin pain 12/20/2017  . Morbid obesity ([place]) 10/09/2017  . Low back pain 07/13/2017  . Leukoplakia, tongue 01/26/2017  . Hyperglycemia 10/20/2016  . Skin lesion 10/20/2016  . S/P unilateral BKA (below knee amputation), right ([place]) 06/14/2016  . Charcot foot due to diabetes mellitus ([place])   . Charcot's arthropathy associated with type 2 diabetes mellitus ([place]) 04/11/2016  . Preventative health care 11/05/2015  . Major depression 09/13/2015  . S/P TKR (total knee replacement) bilaterally 09/13/2015  . GERD (gastroesophageal reflux disease) 09/08/2015  . S/P laparoscopic hernia repair 05/11/2015  . PPD positive 04/08/2015  . Benign neoplasm of descending colon   . Benign neoplasm of cecum   . Acute blood loss anemia   . Chronic anticoagulation   . Occult blood positive stool 10/17/2014  . General weakness 07/14/2014  . Urinary  incontinence 07/14/2014  . Headache(784.0) 10/15/2013  . Hand joint pain 06/10/2013  . Rotator cuff tear arthropathy of both shoulders 06/10/2013  . Skin lesion of cheek 05/01/2013  . Pain of right thumb 04/03/2013  . Balance disorder 03/12/2013  . Gait disorder 03/12/2013  . Tremor 03/12/2013  . Left hip pain 03/12/2013  . Pre-ulcerative corn or callous 02/06/2013  . Anxiety 11/12/2011  . OSA on CPAP 11/07/2011  . Bradycardia 10/20/2011  . Insomnia 10/04/2011  . Obesity 01/12/2011  . Diabetes mellitus type 2 in obese ([place]) 09/27/2010  . ERECTILE DYSFUNCTION, ORGANIC 05/30/2010  . [place] DISEASE, LUMBAR 04/19/2010  . SCIATICA, LEFT 04/19/2010  . Chronic pain syndrome 10/27/2009  . Hyperlipidemia 07/15/2009  . Essential hypertension 06/24/2009  . Coronary artery disease involving native coronary artery of native heart without angina pectoris 06/24/2009  . Allergic rhinitis 06/24/2009  . URETHRAL STRICTURE 06/24/2009  . DEGENERATIVE JOINT DISEASE 06/24/2009  . SHOULDER PAIN, BILATERAL 06/24/2009  . FATIGUE 06/24/2009  . NEPHROLITHIASIS, HX OF 06/24/2009   Past Medical History:  Diagnosis Date  . ALLERGIC RHINITIS 06/24/2009  . Allergy   . Anemia    IDA  . Anxiety 11/12/2011   Adequate for discharge   . Arthritis    "all my joints" (09/30/2013)  . Arthritis of foot, right, degenerative 04/15/2014  . Balance disorder 03/12/2013  . Benign neoplasm of cecum   . Benign neoplasm of descending colon   . CAD (coronary artery disease) 06/24/2009   5 stents placed in 2007    . Chronic anticoagulation   . Chronic pain syndrome 10/27/2009   of ankle, shoulders, low back.  sciatica.   . Closed fracture of right foot 10/17/2014  . CORONARY ARTERY DISEASE 06/24/2009   a. s/p multiple PCIs - In 2008 he had a Taxus DES to the mild LAD, Endeavor DES to mid LCX and distal LCX. In January 2009 he had DES to distal LCX, mid LCX and proximal LCX. In November 2009 had BMS x 2 to the mid RCA. Cath  10/2011 with patent stents, noncardiac CP. LHC 01/2013: patent stents (noncardiac CP).  . DEGENERATIVE JOINT DISEASE 06/24/2009   Qualifier: Diagnosis of  By: [name], [name]   . Depression   . Depression with anxiety    Prior suicide attempt(08/25/19-pt states not suicide attempt)  . [place] DISEASE, LUMBAR 04/19/2010  . ERECTILE DYSFUNCTION, ORGANIC 05/30/2010  . Essential hypertension  06/24/2009   Qualifier: Diagnosis of  By: [name], [name]   . Fibromyalgia   . Fracture dislocation of ankle joint 09/02/2015  . Gait disorder 03/12/2013  . General weakness 07/14/2014  . GERD (gastroesophageal reflux disease) 09/08/2015   08/25/15-pt states was cardiac origin, not GERD  . Hand joint pain 06/10/2013  . Heart murmur   . Hepatitis C    treated pt. unknown with what he was a teenager  . History of kidney stones   . Hyperlipidemia 07/15/2009   Qualifier: Diagnosis of  By: Aundra Dubin, MD, [place]    . HYPERLIPIDEMIA-MIXED 07/15/2009   08/25/19-pt state cholesterol was normal range  . HYPERTENSION 06/24/2009  . Insomnia 10/04/2011  . Irregular heart beat   . Left hip pain 03/12/2013   Injected under ultrasound guidance on June 24, 2013   . Major depression 09/13/2015  . Myocardial infarction ([place]) 07-19-2006  . Non-cardiac chest pain 10/2011, 01/2013  . Obesity   . Occult blood positive stool 10/17/2014  . Open ankle fracture 09/02/2015  . OSA (obstructive sleep apnea)    not using CPAP (09/30/2013)  . Pain of right thumb 04/03/2013  . Pneumonia   . PPD positive 04/08/2015  . Pre-ulcerative corn or callous 02/06/2013  . Rotator cuff tear arthropathy of both shoulders 06/10/2013   History of bilateral shoulder cuff surgery for rotator cuff tears. Reports increase in pain 09/11/2015 during physical therapy of the left shoulder.   [name] SCIATICA, LEFT 04/19/2010   Qualifier: Diagnosis of  By: [name], [name]   . Sleep apnea    wears cpap 08/25/19-States not using due to nasal stuffiness.  [name] Spinal stenosis in cervical  region 09/26/2013  . Spinal stenosis, lumbar region, with neurogenic claudication 09/26/2013  . Type II diabetes mellitus ([place]) 07-19-10   no meds in 09/2014.   [name] Uncontrolled type 2 DM with peripheral circulatory disorder ([place]) 10/04/2013   08/25/19- pt states A1C normal, no diabetes  . URETHRAL STRICTURE 06/24/2009   self catheterizes.     Family History  Problem Relation Age of Onset  . Depression Mother   . Heart disease Mother   . Hypertension Mother   . Breast cancer Mother   . Cancer Mother   . Stomach cancer Mother   . Esophageal cancer Mother   . Pancreatic cancer Mother   . Diabetes Father   . Heart disease Father        CABG  . Hypertension Father   . Hyperlipidemia Father   . Prostate cancer Father   . Skin cancer Father   . Depression Brother        x 2  . Hypertension Brother        x2  . Colon polyps Brother   . Healthy Son   . Heart disease Maternal Grandfather   . Early death Maternal Grandfather   . Heart attack Maternal Grandfather 07/20/63  . Early death Paternal Grandfather   . Coronary artery disease Other   . Hypertension Other   . Depression Other   . Healthy Son   . Colon cancer Neg Hx   . Rectal cancer Neg Hx     Past Surgical History:  Procedure Laterality Date  . AMPUTATION Right 06/14/2016   Procedure: AMPUTATION BELOW KNEE;  Surgeon: Newt Minion, MD;  Location: [facility];  Service: Orthopedics;  Laterality: Right;  . ANKLE FUSION Right 04/15/2014   Procedure: Right Subtalar, Talonavicular Fusion;  Surgeon: Newt Minion, MD;  Location: [facility];  Service: Orthopedics;  Laterality: Right;  . ANKLE FUSION Right 04/18/2016   Procedure: Right Ankle Tibiocalcaneal Fusion;  Surgeon: Newt Minion, MD;  Location: [facility];  Service: Orthopedics;  Laterality: Right;  . ANTERIOR CERVICAL DECOMP/DISCECTOMY FUSION N/A 09/26/2013   Procedure: ANTERIOR CERVICAL DISCECTOMY FUSION C3-4, plate and screw fixation, allograft bone graft;  Surgeon: Jessy Oto, MD;  Location: [facility];   Service: Orthopedics;  Laterality: N/A;  . BACK SURGERY     3  . BELOW KNEE LEG AMPUTATION Right 06/14/2016   right ankle and foot  . CARDIAC CATHETERIZATION  X 1  . CARPAL TUNNEL RELEASE Bilateral   . COLONOSCOPY N/A 10/22/2014   Procedure: COLONOSCOPY;  Surgeon: Lafayette Dragon, MD;  Location[facility] ENDOSCOPY;  Service: Endoscopy;  Laterality: N/A;  . COLONOSCOPY  11/19/2018  . CORONARY ANGIOPLASTY WITH STENT PLACEMENT     "I have 9 stents"  . ESOPHAGOGASTRODUODENOSCOPY N/A 10/19/2014   Procedure: ESOPHAGOGASTRODUODENOSCOPY (EGD);  Surgeon: Jerene Bears, MD;  [facility];  Service: Endoscopy;  Laterality: N/A;  . FRACTURE SURGERY    . FUSION OF TALONAVICULAR JOINT Right 04/15/2014   dr duda  . GASTRIC BYPASS  02/20/2019  . HERNIA REPAIR     umbilical  . INGUINAL HERNIA REPAIR Right 05/11/2015   Procedure: LAPAROSCOPIC REPAIR RIGHT  INGUINAL HERNIA;  Surgeon: Greer Pickerel, MD;  Location: [facility];  Service: General;  Laterality: Right;  . INSERTION OF MESH Right 05/11/2015   Procedure: INSERTION OF MESH;  Surgeon: Greer Pickerel, MD;  Location: [facility];  Service: General;  Laterality: Right;  . JOINT REPLACEMENT    . KNEE CARTILAGE SURGERY Right X 12   "~ 1/2 open; ~ 1/2 scopes"  . KNEE CARTILAGE SURGERY Left X 3   "3 scopes"  . LAPAROSCOPIC GASTRIC SLEEVE RESECTION N/A 03/03/2019   Procedure: LAPAROSCOPIC GASTRIC SLEEVE RESECTION, Upper Endo, ERAS Pathway;  Surgeon: Greer Pickerel, MD;  Location: [facility];  Service: General;  Laterality: N/A;  . LEFT HEART CATHETERIZATION WITH CORONARY ANGIOGRAM N/A 02/10/2013   Procedure: LEFT HEART CATHETERIZATION WITH CORONARY ANGIOGRAM;  Surgeon: Burnell Blanks, MD;  Location: [facility] CATH LAB;  Service: Cardiovascular;  Laterality: N/A;  . LUMBAR LAMINECTOMY N/A 07/16/2018   Procedure: LEFT L4-5 REDO PARTIAL LUMBAR HEMILAMINECTOMY WITH FORAMINOTOMY LEFT L4;  Surgeon: Jessy Oto, MD;  Location: [facility];  Service: Orthopedics;  Laterality: N/A;  .  LUMBAR LAMINECTOMY/DECOMPRESSION MICRODISCECTOMY N/A 01/27/2014   Procedure: CENTRAL LUMBAR LAMINECTOMY L4-5 AND L3-4;  Surgeon: Jessy Oto, MD;  Location: [facility];  Service: Orthopedics;  Laterality: N/A;  . ORIF ANKLE FRACTURE Right 09/02/2015   Procedure: OPEN REDUCTION INTERNAL FIXATION (ORIF) ANKLE FRACTURE;  Surgeon: Newt Minion, MD;  Location: [facility];  Service: Orthopedics;  Laterality: Right;  . PERIPHERALLY INSERTED CENTRAL CATHETER INSERTION  09/02/2015  . POLYPECTOMY    . SHOULDER ARTHROSCOPY W/ ROTATOR CUFF REPAIR Bilateral    "3 on the right; 1 on the left"  . SKIN SPLIT GRAFT Right 10/01/2015   Procedure: RIGHT ANKLE APPLY SKIN GRAFT SPLIT THICKNESS;  Surgeon: Newt Minion, MD;  Location: [facility];  Service: Orthopedics;  Laterality: Right;  . TONSILLECTOMY    . TOOTH EXTRACTION    . TOTAL HIP ARTHROPLASTY Right 04/17/2018  . TOTAL HIP ARTHROPLASTY Right 04/17/2018   Procedure: RIGHT TOTAL HIP ARTHROPLASTY;  Surgeon: Newt Minion, MD;  Location: [facility];  Service: Orthopedics;  Laterality: Right;  .  TOTAL HIP ARTHROPLASTY Left 06/06/2019   Procedure: LEFT TOTAL HIP ARTHROPLASTY ANTERIOR APPROACH;  Surgeon: Mcarthur Rossetti, MD;  Location: [facility];  Service: Orthopedics;  Laterality: Left;  . TOTAL HIP REVISION Left 05/24/2019  . TOTAL KNEE ARTHROPLASTY Bilateral 2008  . UMBILICAL HERNIA REPAIR     UHR  . UPPER GASTROINTESTINAL ENDOSCOPY  2016  . URETHRAL DILATION  X 4  . VASECTOMY    . WISDOM TOOTH EXTRACTION     Social History   Occupational History  . Occupation: disabled since 2006 due to ortho. heart, psych    Employer: UNEMPLOYED  . Occupation: part time work as an Multimedia programmer, wrestling, and Holiday representative  Tobacco Use  . Smoking status: Former Smoker    Types: Cigars    Quit date: 08/28/2010    Years since quitting: 9.4  . Smokeless tobacco: Never Used  . Tobacco comment: 04/18/2016 "smoked 1 cigar/wk when I did smoke"  Vaping Use    . Vaping Use: Never used  Substance and Sexual Activity  . Alcohol use: Not Currently    Alcohol/week: 0.0 standard drinks    Comment: rarely  . Drug use: No  . Sexual activity: Not Currently

## 2020-01-28 NOTE — Patient Instructions (Signed)
Avoid bending, stooping and avoid lifting weights greater than 10 lbs. Avoid prolong standing and walking. Avoid frequent bending and stooping  No lifting greater than 10 lbs. May use ice or moist heat for pain. Weight loss is of benefit. Handicap license is approved. Dr. Romona Curls secretary/Assistant will call to arrange for left SI joint injection to help determine how much pain is due to the left Sacroiliac joint Lengthen the right leg due to leg length discrepancy.

## 2020-01-30 DIAGNOSIS — G4733 Obstructive sleep apnea (adult) (pediatric): Secondary | ICD-10-CM | POA: Diagnosis not present

## 2020-02-02 ENCOUNTER — Other Ambulatory Visit: Payer: Self-pay | Admitting: Physician Assistant

## 2020-02-04 ENCOUNTER — Other Ambulatory Visit (HOSPITAL_COMMUNITY): Payer: Self-pay | Admitting: Psychiatry

## 2020-02-04 DIAGNOSIS — F319 Bipolar disorder, unspecified: Secondary | ICD-10-CM

## 2020-02-06 DIAGNOSIS — M47816 Spondylosis without myelopathy or radiculopathy, lumbar region: Secondary | ICD-10-CM | POA: Diagnosis not present

## 2020-02-06 DIAGNOSIS — M961 Postlaminectomy syndrome, not elsewhere classified: Secondary | ICD-10-CM | POA: Diagnosis not present

## 2020-02-06 DIAGNOSIS — G894 Chronic pain syndrome: Secondary | ICD-10-CM | POA: Diagnosis not present

## 2020-02-09 ENCOUNTER — Telehealth (HOSPITAL_COMMUNITY): Payer: Self-pay | Admitting: *Deleted

## 2020-02-09 ENCOUNTER — Other Ambulatory Visit (HOSPITAL_COMMUNITY): Payer: Self-pay | Admitting: *Deleted

## 2020-02-09 ENCOUNTER — Other Ambulatory Visit (HOSPITAL_COMMUNITY): Payer: Self-pay | Admitting: Psychiatry

## 2020-02-09 ENCOUNTER — Other Ambulatory Visit: Payer: Self-pay | Admitting: Internal Medicine

## 2020-02-09 DIAGNOSIS — F411 Generalized anxiety disorder: Secondary | ICD-10-CM

## 2020-02-09 NOTE — Telephone Encounter (Signed)
Patient called and stated he was out of Trazadone and Cymbalta.  He has an appt. 9/29.  He should have had enough medication to last till appt but says he is out and cant sleep.  Please review.

## 2020-02-09 NOTE — Telephone Encounter (Signed)
He should not be out off his medication.  We can provide one time 1 week supply of trazodone and Cymbalta and will discuss this issue on his next appointment.

## 2020-02-09 NOTE — Telephone Encounter (Signed)
Sent to Dr. John. 

## 2020-02-09 NOTE — Telephone Encounter (Signed)
Done erx 

## 2020-02-10 ENCOUNTER — Ambulatory Visit: Payer: Medicare HMO | Admitting: Skilled Nursing Facility1

## 2020-02-10 DIAGNOSIS — J343 Hypertrophy of nasal turbinates: Secondary | ICD-10-CM | POA: Diagnosis not present

## 2020-02-10 DIAGNOSIS — J338 Other polyp of sinus: Secondary | ICD-10-CM | POA: Diagnosis not present

## 2020-02-11 ENCOUNTER — Ambulatory Visit: Payer: Medicare HMO | Admitting: Skilled Nursing Facility1

## 2020-02-12 ENCOUNTER — Ambulatory Visit: Payer: Medicare HMO | Admitting: Psychology

## 2020-02-16 ENCOUNTER — Other Ambulatory Visit: Payer: Self-pay | Admitting: Gastroenterology

## 2020-02-17 DIAGNOSIS — R3915 Urgency of urination: Secondary | ICD-10-CM | POA: Diagnosis not present

## 2020-02-17 DIAGNOSIS — N410 Acute prostatitis: Secondary | ICD-10-CM | POA: Diagnosis not present

## 2020-02-18 ENCOUNTER — Telehealth (INDEPENDENT_AMBULATORY_CARE_PROVIDER_SITE_OTHER): Payer: Medicare HMO | Admitting: Psychiatry

## 2020-02-18 ENCOUNTER — Other Ambulatory Visit: Payer: Self-pay

## 2020-02-18 ENCOUNTER — Encounter: Payer: Self-pay | Admitting: Gastroenterology

## 2020-02-18 ENCOUNTER — Ambulatory Visit (INDEPENDENT_AMBULATORY_CARE_PROVIDER_SITE_OTHER): Payer: Medicare HMO | Admitting: Gastroenterology

## 2020-02-18 ENCOUNTER — Encounter (HOSPITAL_COMMUNITY): Payer: Self-pay | Admitting: Psychiatry

## 2020-02-18 VITALS — Wt 300.0 lb

## 2020-02-18 VITALS — BP 142/70 | HR 60 | Ht 72.0 in | Wt 305.0 lb

## 2020-02-18 DIAGNOSIS — F4321 Adjustment disorder with depressed mood: Secondary | ICD-10-CM

## 2020-02-18 DIAGNOSIS — F319 Bipolar disorder, unspecified: Secondary | ICD-10-CM | POA: Diagnosis not present

## 2020-02-18 DIAGNOSIS — F411 Generalized anxiety disorder: Secondary | ICD-10-CM

## 2020-02-18 DIAGNOSIS — Z791 Long term (current) use of non-steroidal anti-inflammatories (NSAID): Secondary | ICD-10-CM

## 2020-02-18 DIAGNOSIS — D509 Iron deficiency anemia, unspecified: Secondary | ICD-10-CM | POA: Diagnosis not present

## 2020-02-18 DIAGNOSIS — Z86010 Personal history of colonic polyps: Secondary | ICD-10-CM | POA: Diagnosis not present

## 2020-02-18 MED ORDER — DULOXETINE HCL 30 MG PO CPEP: 30.0000 mg | ORAL_CAPSULE | Freq: Two times a day (BID) | ORAL | 0 refills | Status: DC

## 2020-02-18 MED ORDER — TRAZODONE HCL 150 MG PO TABS: 150.0000 mg | ORAL_TABLET | Freq: Every day | ORAL | 0 refills | Status: DC

## 2020-02-18 MED ORDER — BENZTROPINE MESYLATE 0.5 MG PO TABS: 0.5000 mg | ORAL_TABLET | Freq: Every day | ORAL | 0 refills | Status: DC

## 2020-02-18 MED ORDER — ARIPIPRAZOLE 5 MG PO TABS: 5.0000 mg | ORAL_TABLET | Freq: Every day | ORAL | 0 refills | Status: DC

## 2020-02-18 NOTE — Progress Notes (Signed)
Virtual Visit via Telephone Note  I connected with Travis Novak Sr. on 02/18/20 at  1:00 PM EDT by telephone and verified that I am speaking with the correct person using two identifiers.  Location: Patient: Home Provider: Home office   I discussed the limitations, risks, security and privacy concerns of performing an evaluation and management service by telephone and the availability of in person appointments. I also discussed with the patient that there may be a patient responsible charge related to this service. The patient expressed understanding and agreed to proceed.   History of Present Illness: Patient is evaluated by phone session.  He has been out of his medication for past 10 days.  He is not sure how and why because we have prescribed a 90-day supply and that should last until today.  He reported poor sleep, irritability and in a lot of pain.  He is also sad because he broke up with the girl who lives in [place].  He supposed to moved to [place] 2 months ago but that did not happen.  Patient told the woman was looking for the money and relationship did not work out.  He is a still grieving about his son who died 3 months ago due to massive heart attack.  He is living with his niece and her children.  He has applied for [place] 8 but still waiting for the place.  He admitted irritability, mood swings.  He again more than 40 pounds in past 3 months because he is not able to walk and impulsive needing.  He started therapy with Dr. Cheryln Manly.  He is now seeing pain management at [facility] by Dr. Posey Pronto.  He is getting pain medicine tomorrow he is going to have an MRI and pain injection.  He is hoping that may help his pain.  He like to lose weight.  He had a gastric bypass and he was able to lost 70 pounds but he gained back because he is not doing exercise.  Denies any paranoia but endorsed increased irritability.  Does not want to change his medication because he reported when he takes  the medicine it does help.  He has no tremors, shakes or any EPS.  Denies any suicidal thoughts.   Past Psychiatric History: Sawpsychiatrist in [place] and [place]. H/Omood swings,suicidal gesture,anger,hallucination, irritabilityandincrease spending.Married 5 times. H/Oinpatient in 2014 due to overdose on Xanaxandalcohol.DidIOP. TookEffexor, Xanax, Klonopin, Ambien,Lunesta, Wellbutrin,Ambien,Remeronand Prozac.Abilify started in ouroffice.    Psychiatric Specialty Exam: Physical Exam  Review of Systems  Weight 300 lb (136.1 kg).There is no height or weight on file to calculate BMI.  General Appearance: NA  Eye Contact:  NA  Speech:  Normal Rate  Volume:  Normal  Mood:  Dysphoric and Irritable  Affect:  NA  Thought Process:  Descriptions of Associations: Intact  Orientation:  Full (Time, Place, and Person)  Thought Content:  Rumination  Suicidal Thoughts:  No  Homicidal Thoughts:  No  Memory:  Immediate;   Good Recent;   Good Remote;   Good  Judgement:  Fair  Insight:  Shallow  Psychomotor Activity:  NA  Concentration:  Concentration: Fair and Attention Span: Fair  Recall:  Good  Fund of Knowledge:  Good  Language:  Good  Akathisia:  No  Handed:  Right  AIMS (if indicated):     Assets:  Communication Skills Desire for Improvement Housing  ADL's:  Intact  Cognition:  WNL  Sleep:   poor  Assessment and Plan: Bipolar disorder type I.  Generalized anxiety disorder.  Grief.  Discuss weight gain, noncompliant with medication.  Recommend that he need to count the pills because delete the pharmacy as he should have enough medication until his next appointment.  He agreed and he promised that he will call the prescription before he leaves the pharmacy.  I also recommend that he should discuss with Dr. Cheryln Manly about grief and loss  I encouraged that if he cannot walk and at least watch for his calorie intake.  Discussed healthy lifestyle and side  effects of increased weight gain.  Patient has chronic back pain and increased weight may cause worsening of his pain.  He acknowledged and he promised that he would work hard to cut down his impulsive eating.  He like to keep his current medication.  We will continue Cymbalta 30 mg twice a day, Cogentin 0.5 mg at bedtime, Abilify 5 mg daily and trazodone 150 mg at bedtime.  Recommended to call us back if there is any question or any concern.  Follow-up in 3 months.  Follow Up Instructions:    I discussed the assessment and treatment plan with the patient. The patient was provided an opportunity to ask questions and all were answered. The patient agreed with the plan and demonstrated an understanding of the instructions.   The patient was advised to call back or seek an in-person evaluation if the symptoms worsen or if the condition fails to improve as anticipated.  I provided 25 minutes of non-face-to-face time during this encounter.   Kathlee Nations, MD

## 2020-02-18 NOTE — Progress Notes (Signed)
HPI :  69 year old male here for follow-up visit.  I have previously seen him for pancreatic exocrine deficiency, GERD, history of colon polyps.  He represented to the office in March with new iron deficiency anemia.  This was in the setting of using Celebrex daily as well as baby aspirin and Effient.  His last colonoscopy in June 2020 did not show any high risk lesions.  We proceeded with an upper endoscopy in April which showed evidence of sleeve gastrectomy with gastritis.  Given some dysphagia he had we also performed empiric dilation although no obvious stenosis was noted.  Since the endoscopy I had recommended increasing his Protonix to 40 mg twice daily and he was placed on oral iron which he takes daily.  I had recommended minimizing his Celebrex use and stopping that if possible.  He states he has not been able to stop the Celebrex, takes it for low back pain which is an ongoing issue for him.  He has a pending injection to his back as well as an MRI and possible surgery for his ongoing pain.  He continues to take aspirin 81 mg a day.  He was told yesterday he had some inflammation of his prostate and his urologist added meloxicam 15 mg a day to the regimen.  We discussed this regimen for a bit and associated risks.  He is still taking pantoprazole 40 mg twice daily.  He reports this controls his reflux very well.  He states the dilation helped his dysphagia and that is not really bothering him anymore.  He denies any abdominal pains.  No blood in his stools.  No bowel issues.  He continues to take Zenpep for pancreatic exocrine deficiency and its working well.  He had a CT scan of his abdomen and pelvis 1 year ago which showed a normal pancreas, no pathology of his bowel otherwise.  Generally he is otherwise feeling well with exception of his ongoing back pain.    Colonoscopy 10/22/2014 - 2 polyps removed, largest 1cm in size - adenomas- Dr. Olevia Perches EGD 10/19/2014 - erosive gastritis. H pylori  testing negative   Colonoscopy 11/19/18 - The perianal and digital rectal examinations were normal. - The terminal ileum appeared normal. - A 3 mm polyp was found in the cecum. The polyp was sessile. The polyp was removed with a cold snare. Resection and retrieval were complete. - Two sessile polyps were found in the ascending colon. The polyps were 3 mm in size. These polyps were removed with a cold snare. Resection and retrieval were complete. - Two sessile polyps were found in the transverse colon. The polyps were 3 mm in size. These polyps were removed with a cold snare. Resection and retrieval were complete. - A 3 mm polyp was found in the sigmoid colon. The polyp was sessile. The polyp was removed with a cold snare. Resection and retrieval were complete. - There was a medium-sized lipoma, in the ascending colon. - Internal hemorrhoids were found during retroflexion. - The exam was otherwise without abnormality.  Random biopsies were normal Multiple small adenomas, repeat colon in 3 years  EGD 09/08/19 -  - The exam of the esophagus showed some suspected adherent ingested material vs. candidiasis, suspect the former, it was able to wash off. The esophagus was otherwise normal, no obvious stenosis / stricture. - A guidewire was placed and the scope was withdrawn. Empiric dilation was performed in the entire esophagus with a Savary dilator with mild resistance at 17  mm and 18 mm. Relook endoscopy showed no mucosal wrents. Biopsies were taken with a cold forceps in the upper third of the esophagus, in the middle third of the esophagus and in the lower third of the esophagus for histology. - Evidence of a sleeve gastrectomy was found in the gastric fundus and in the gastric body. - Diffuse inflammation characterized by erythema and friability was found in the gastric body and in the gastric antrum. No focal ulcers. Biopsies were taken with a cold forceps for Helicobacter pylori  testing. - The exam of the stomach was otherwise normal. - The duodenal bulb and second portion of the duodenum were normal.  1. Surgical [P], gastric antrum and gastric body - ANTRAL AND OXYNTIC MUCOSA WITH SLIGHT CHRONIC INFLAMMATION AND REACTIVE CHANGES. - WARTHIN-STARRY NEGATIVE FOR HELICOBACTER PYLORI. - NO INTESTINAL METAPLASIA, DYSPLASIA, OR CARCINOMA. 2. Surgical [P], esophagus - BENIGN SQUAMOUS MUCOSA. - NO EOSINOPHILIC ESOPHAGITIS (LESS THAN FIVE PER HIGH POWER FIELD).   CT scan 02/10/19 - IMPRESSION: Unremarkable CT abdomen. Specifically, the pancreas is within normal limits.   CBC Latest Ref Rng & Units 11/26/2019 07/24/2019 06/09/2019  WBC 4.0 - 10.5 K/uL 6.3 5.6 6.3  Hemoglobin 13.0 - 17.0 g/dL 13.9 10.5(L) 9.3(L)  Hematocrit 39 - 52 % 41.0 32.5(L) 27.5(L)  Platelets 150 - 400 K/uL 224.0 293.0 252       Past Medical History:  Diagnosis Date  . ALLERGIC RHINITIS 06/24/2009  . Allergy   . Anemia    IDA  . Anxiety 11/12/2011   Adequate for discharge   . Arthritis    "all my joints" (09/30/2013)  . Arthritis of foot, right, degenerative 04/15/2014  . Balance disorder 03/12/2013  . Benign neoplasm of cecum   . Benign neoplasm of descending colon   . CAD (coronary artery disease) 06/24/2009   5 stents placed in 2007    . Chronic anticoagulation   . Chronic pain syndrome 10/27/2009   of ankle, shoulders, low back.  sciatica.   . Closed fracture of right foot 10/17/2014  . CORONARY ARTERY DISEASE 06/24/2009   a. s/p multiple PCIs - In 2008 he had a Taxus DES to the mild LAD, Endeavor DES to mid LCX and distal LCX. In January 2009 he had DES to distal LCX, mid LCX and proximal LCX. In November 2009 had BMS x 2 to the mid RCA. Cath 10/2011 with patent stents, noncardiac CP. LHC 01/2013: patent stents (noncardiac CP).  . DEGENERATIVE JOINT DISEASE 06/24/2009   Qualifier: Diagnosis of  By: [name], [name]   . Depression   . Depression with anxiety    Prior suicide  attempt(08/25/19-pt states not suicide attempt)  . [place] DISEASE, LUMBAR 04/19/2010  . ERECTILE DYSFUNCTION, ORGANIC 05/30/2010  . Essential hypertension 06/24/2009   Qualifier: Diagnosis of  By: [name], [name]   . Fibromyalgia   . Fracture dislocation of ankle joint 09/02/2015  . Gait disorder 03/12/2013  . General weakness 07/14/2014  . GERD (gastroesophageal reflux disease) 09/08/2015   08/25/15-pt states was cardiac origin, not GERD  . Hand joint pain 06/10/2013  . Heart murmur   . Hepatitis C    treated pt. unknown with what he was a teenager  . History of kidney stones   . Hyperlipidemia 07/15/2009   Qualifier: Diagnosis of  By: Aundra Dubin, MD, [place]    . HYPERLIPIDEMIA-MIXED 07/15/2009   08/25/19-pt state cholesterol was normal range  . HYPERTENSION 06/24/2009  . Insomnia 10/04/2011  . Irregular heart  beat   . Left hip pain 03/12/2013   Injected under ultrasound guidance on June 24, 2013   . Major depression 09/13/2015  . Myocardial infarction ([place]) 2008  . Non-cardiac chest pain 10/2011, 01/2013  . Obesity   . Occult blood positive stool 10/17/2014  . Open ankle fracture 09/02/2015  . OSA (obstructive sleep apnea)    not using CPAP (09/30/2013)  . Pain of right thumb 04/03/2013  . Pneumonia   . PPD positive 04/08/2015  . Pre-ulcerative corn or callous 02/06/2013  . Rotator cuff tear arthropathy of both shoulders 06/10/2013   History of bilateral shoulder cuff surgery for rotator cuff tears. Reports increase in pain 09/11/2015 during physical therapy of the left shoulder.   [name] SCIATICA, LEFT 04/19/2010   Qualifier: Diagnosis of  By: [name], [name]   . Sleep apnea    wears cpap 08/25/19-States not using due to nasal stuffiness.  [name] Spinal stenosis in cervical region 09/26/2013  . Spinal stenosis, lumbar region, with neurogenic claudication 09/26/2013  . Type II diabetes mellitus ([place]) 2012   no meds in 09/2014.   [name] Uncontrolled type 2 DM with peripheral circulatory disorder ([place]) 10/04/2013    08/25/19- pt states A1C normal, no diabetes  . URETHRAL STRICTURE 06/24/2009   self catheterizes.      Past Surgical History:  Procedure Laterality Date  . AMPUTATION Right 06/14/2016   Procedure: AMPUTATION BELOW KNEE;  Surgeon: Newt Minion, MD;  Location: [place];  Service: Orthopedics;  Laterality: Right;  . ANKLE FUSION Right 04/15/2014   Procedure: Right Subtalar, Talonavicular Fusion;  Surgeon: Newt Minion, MD;  Location: [place];  Service: Orthopedics;  Laterality: Right;  . ANKLE FUSION Right 04/18/2016   Procedure: Right Ankle Tibiocalcaneal Fusion;  Surgeon: Newt Minion, MD;  Location: [place];  Service: Orthopedics;  Laterality: Right;  . ANTERIOR CERVICAL DECOMP/DISCECTOMY FUSION N/A 09/26/2013   Procedure: ANTERIOR CERVICAL DISCECTOMY FUSION C3-4, plate and screw fixation, allograft bone graft;  Surgeon: Jessy Oto, MD;  Location: [place];  Service: Orthopedics;  Laterality: N/A;  . BACK SURGERY     3  . BELOW KNEE LEG AMPUTATION Right 06/14/2016   right ankle and foot  . CARDIAC CATHETERIZATION  X 1  . CARPAL TUNNEL RELEASE Bilateral   . COLONOSCOPY N/A 10/22/2014   Procedure: COLONOSCOPY;  Surgeon: Lafayette Dragon, MD;  Location: [facility] ENDOSCOPY;  Service: Endoscopy;  Laterality: N/A;  . COLONOSCOPY  11/19/2018  . CORONARY ANGIOPLASTY WITH STENT PLACEMENT     "I have 9 stents"  . ESOPHAGOGASTRODUODENOSCOPY N/A 10/19/2014   Procedure: ESOPHAGOGASTRODUODENOSCOPY (EGD);  Surgeon: Jerene Bears, MD;  Location: [facility];  Service: Endoscopy;  Laterality: N/A;  . FRACTURE SURGERY    . FUSION OF TALONAVICULAR JOINT Right 04/15/2014   dr duda  . GASTRIC BYPASS  02/20/2019  . HERNIA REPAIR     umbilical  . INGUINAL HERNIA REPAIR Right 05/11/2015   Procedure: LAPAROSCOPIC REPAIR RIGHT  INGUINAL HERNIA;  Surgeon: Greer Pickerel, MD;  Location: [place];  Service: General;  Laterality: Right;  . INSERTION OF MESH Right 05/11/2015   Procedure: INSERTION OF MESH;  Surgeon: Greer Pickerel, MD;   Location: [place];  Service: General;  Laterality: Right;  . JOINT REPLACEMENT    . KNEE CARTILAGE SURGERY Right X 12   "~ 1/2 open; ~ 1/2 scopes"  . KNEE CARTILAGE SURGERY Left X 3   "3 scopes"  . LAPAROSCOPIC GASTRIC SLEEVE RESECTION N/A  03/03/2019   Procedure: LAPAROSCOPIC GASTRIC SLEEVE RESECTION, Upper Endo, ERAS Pathway;  Surgeon: Greer Pickerel, MD;  Location: [facility];  Service: General;  Laterality: N/A;  . LEFT HEART CATHETERIZATION WITH CORONARY ANGIOGRAM N/A 02/10/2013   Procedure: LEFT HEART CATHETERIZATION WITH CORONARY ANGIOGRAM;  Surgeon: Burnell Blanks, MD;  Location: [facility] CATH LAB;  Service: Cardiovascular;  Laterality: N/A;  . LUMBAR LAMINECTOMY N/A 07/28/18   Procedure: LEFT L4-5 REDO PARTIAL LUMBAR HEMILAMINECTOMY WITH FORAMINOTOMY LEFT L4;  Surgeon: Jessy Oto, MD;  Location: [place];  Service: Orthopedics;  Laterality: N/A;  . LUMBAR LAMINECTOMY/DECOMPRESSION MICRODISCECTOMY N/A 01/27/2014   Procedure: CENTRAL LUMBAR LAMINECTOMY L4-5 AND L3-4;  Surgeon: Jessy Oto, MD;  Location: [place];  Service: Orthopedics;  Laterality: N/A;  . ORIF ANKLE FRACTURE Right 09/02/2015   Procedure: OPEN REDUCTION INTERNAL FIXATION (ORIF) ANKLE FRACTURE;  Surgeon: Newt Minion, MD;  Location: [place];  Service: Orthopedics;  Laterality: Right;  . PERIPHERALLY INSERTED CENTRAL CATHETER INSERTION  09/02/2015  . POLYPECTOMY    . ROTATOR CUFF REPAIR Left    x 2  . ROTATOR CUFF REPAIR Right    x 3  . SHOULDER ARTHROSCOPY W/ ROTATOR CUFF REPAIR Bilateral    "3 on the right; 1 on the left"  . SKIN SPLIT GRAFT Right 10/01/2015   Procedure: RIGHT ANKLE APPLY SKIN GRAFT SPLIT THICKNESS;  Surgeon: Newt Minion, MD;  Location: [place];  Service: Orthopedics;  Laterality: Right;  . TONSILLECTOMY    . TOOTH EXTRACTION    . TOTAL HIP ARTHROPLASTY Right 04/17/2018  . TOTAL HIP ARTHROPLASTY Right 04/17/2018   Procedure: RIGHT TOTAL HIP ARTHROPLASTY;  Surgeon: Newt Minion, MD;  Location: [place];   Service: Orthopedics;  Laterality: Right;  . TOTAL HIP ARTHROPLASTY Left 06/06/2019   Procedure: LEFT TOTAL HIP ARTHROPLASTY ANTERIOR APPROACH;  Surgeon: Mcarthur Rossetti, MD;  Location: [place];  Service: Orthopedics;  Laterality: Left;  . TOTAL HIP REVISION Left 05/24/2019  . TOTAL KNEE ARTHROPLASTY Bilateral 07-28-2006  . UMBILICAL HERNIA REPAIR     UHR  . UPPER GASTROINTESTINAL ENDOSCOPY  July 28, 2014  . URETHRAL DILATION  X 4  . VASECTOMY    . WISDOM TOOTH EXTRACTION     Family History  Problem Relation Age of Onset  . Depression Mother   . Heart disease Mother   . Hypertension Mother   . Breast cancer Mother        primary cancer  . Stomach cancer Mother   . Esophageal cancer Mother   . Pancreatic cancer Mother   . Diabetes Father   . Heart disease Father        CABG  . Hypertension Father   . Hyperlipidemia Father   . Prostate cancer Father   . Skin cancer Father   . Depression Brother        x 2  . Hypertension Brother        x2  . Colon polyps Brother   . Heart attack Son 75  . Early death Son   . Heart disease Maternal Grandfather   . Early death Maternal Grandfather   . Heart attack Maternal Grandfather 07-29-63  . Early death Paternal Grandfather   . Coronary artery disease Other   . Hypertension Other   . Depression Other   . Healthy Son   . Colon cancer Neg Hx   . Rectal cancer Neg Hx    Social History   Tobacco Use  . Smoking status:  Former Smoker    Types: Cigars    Quit date: 08/28/2010    Years since quitting: 9.4  . Smokeless tobacco: Never Used  . Tobacco comment: 04/18/2016 "smoked 1 cigar/wk when I did smoke"  Vaping Use  . Vaping Use: Never used  Substance Use Topics  . Alcohol use: Not Currently    Alcohol/week: 0.0 standard drinks    Comment: rarely  . Drug use: No   Current Outpatient Medications  Medication Sig Dispense Refill  . albuterol (VENTOLIN HFA) 108 (90 Base) MCG/ACT inhaler Inhale 2 puffs into the lungs every 6 (six) hours as needed  for wheezing or shortness of breath. 18 g 1  . amoxicillin (AMOXIL) 500 MG capsule daily as needed. AS NEEDED FOR DENTAL WORK    . ARIPiprazole (ABILIFY) 5 MG tablet Take 1 tablet (5 mg total) by mouth at bedtime. 90 tablet 0  . aspirin 81 MG chewable tablet Chew 1 tablet (81 mg total) by mouth daily. 35 tablet 0  . atorvastatin (LIPITOR) 20 MG tablet Take 1 tablet (20 mg total) by mouth daily. 90 tablet 1  . benztropine (COGENTIN) 0.5 MG tablet Take 1 tablet (0.5 mg total) by mouth at bedtime. 90 tablet 0  . calcium carbonate (TUMS - DOSED IN MG ELEMENTAL CALCIUM) 500 MG chewable tablet Chew 1,000 mg by mouth at bedtime.    . Calcium Polycarbophil (FIBER-CAPS PO) Take 3 capsules by mouth at bedtime.    . celecoxib (CELEBREX) 200 MG capsule Take 1 capsule (200 mg total) by mouth 2 (two) times daily. 180 capsule 3  . DULoxetine (CYMBALTA) 30 MG capsule Take 1 capsule (30 mg total) by mouth 2 (two) times daily. 180 capsule 0  . gabapentin (NEURONTIN) 800 MG tablet Take 800 mg by mouth 3 (three) times daily.     . hydrochlorothiazide (HYDRODIURIL) 25 MG tablet Take 1 tablet (25 mg total) by mouth daily. 90 tablet 3  . HYDROcodone-acetaminophen (NORCO) 10-325 MG tablet Take 1 tablet by mouth every 8 (eight) hours as needed for moderate pain.     . iron polysaccharides (NU-IRON) 150 MG capsule Take 1 capsule (150 mg total) by mouth daily. 90 capsule 1  . Melatonin 5 MG TABS Take 20 mg by mouth at bedtime.     . meloxicam (MOBIC) 15 MG tablet Take 15 mg by mouth daily.    . methocarbamol (ROBAXIN) 500 MG tablet Take 1 tablet (500 mg total) by mouth every 8 (eight) hours as needed for muscle spasms. 90 tablet 1  . Multiple Vitamins-Minerals (BARIATRIC MULTIVITAMINS/IRON PO) Take 1 tablet by mouth daily.    [name] MYRBETRIQ 50 MG TB24 tablet Take 50 mg by mouth at bedtime.     . Omega-3 Fatty Acids (FISH OIL) 1000 MG CAPS Take 1,000 mg by mouth at bedtime.     [name] oxybutynin (DITROPAN-XL) 10 MG 24 hr tablet  Take 10 mg by mouth daily.    [name] oxymetazoline (AFRIN) 0.05 % nasal spray Place 1 spray into both nostrils 2 (two) times daily as needed for congestion.    . Pancrelipase, Lip-Prot-Amyl, (ZENPEP) 15000-47000 units CPEP Take 2 capsules with meals and 1 capsule with snacks    . pantoprazole (PROTONIX) 40 MG tablet Take 1 tablet (40 mg total) by mouth 2 (two) times daily. 60 tablet 2  . prasugrel (EFFIENT) 5 MG TABS tablet Take 1 tablet (5 mg total) by mouth daily. 90 tablet 3  . sildenafil (VIAGRA) 100 MG tablet Take 100 mg  by mouth daily as needed for erectile dysfunction.     . Testosterone 1.62 % GEL Place 1 Pump onto the skin daily. 88 g 1  . traZODone (DESYREL) 150 MG tablet Take 1 tablet (150 mg total) by mouth at bedtime. 90 tablet 0   No current facility-administered medications for this visit.   No Known Allergies   Review of Systems: All systems reviewed and negative except where noted in HPI.   CBC Latest Ref Rng & Units 11/26/2019 07/24/2019 06/09/2019  WBC 4.0 - 10.5 K/uL 6.3 5.6 6.3  Hemoglobin 13.0 - 17.0 g/dL 13.9 10.5(L) 9.3(L)  Hematocrit 39 - 52 % 41.0 32.5(L) 27.5(L)  Platelets 150 - 400 K/uL 224.0 293.0 252   Lab Results  Component Value Date   IRON 23 (L) 07/24/2019   TIBC 186 (L) 10/18/2014   FERRITIN 147 10/18/2014      Physical Exam: BP (!) 142/70   Pulse 60   Ht 6' (1.829 m)   Wt (!) 305 lb (138.3 kg) Comment: with prosthesis- patient unsure how much this weighs  BMI 41.37 kg/m  Constitutional: Pleasant,well-developed, male in no acute distress. Neurological: Alert and oriented to person place and time. Psychiatric: Normal mood and affect. Behavior is normal.   ASSESSMENT AND PLAN: 69 year old male here for reassessment of the following issues:  Iron deficiency anemia / NSAID use - colonoscopy up-to-date without any high risk lesions.  EGD performed which showed gastritis.  I think is iron deficiency is probably being driven by combination of aspirin  plus Celebrex use in the setting of Effient.  He was started on iron and also on Protonix 40 mg twice daily and his hemoglobin has normalized.  He has no overt bleeding symptoms.  I had recommended to minimize Celebrex and stop if possible, he has not been able to do that due to ongoing back pain.  It appears that he was recently given a prescription for meloxicam on top of this for prostate issues.  I relayed my concerns about use of multiple NSAIDs at once and his risk for GI bleeding and recurrent iron deficiency anemia.  I asked that he contact his urologist to see if there are other options to manage his prostate and hopefully he can hold off on the meloxicam.  Otherwise he should continue Protonix twice daily as long as he is using ongoing Celebrex and aspirin, he should also continue his iron.  He is having a follow-up CBC with his primary care tomorrow, hopefully hemoglobin is stable, he should contact me if not.  I do not feel strongly that he needs a capsule endoscopy at this point given his history and normal CT scan last year showing no small bowel pathology.  He agreed with the plan, will hold off on meloxicam right now and speak with his primary care and Urologist about this.  History of colon polyps - multiple colon polyps removed in 2020, due for surveillance colonoscopy in 2023.  He agreed  St. Anthony Cellar, MD [facility]

## 2020-02-19 ENCOUNTER — Encounter: Payer: Self-pay | Admitting: Physical Medicine and Rehabilitation

## 2020-02-19 ENCOUNTER — Ambulatory Visit (INDEPENDENT_AMBULATORY_CARE_PROVIDER_SITE_OTHER): Payer: Medicare HMO | Admitting: Physical Medicine and Rehabilitation

## 2020-02-19 ENCOUNTER — Ambulatory Visit
Admission: RE | Admit: 2020-02-19 | Discharge: 2020-02-19 | Disposition: A | Discharge status: Home / Self Care | Payer: Medicare HMO | Source: Ambulatory Visit | Attending: Specialist | Admitting: Specialist

## 2020-02-19 ENCOUNTER — Other Ambulatory Visit: Payer: Self-pay

## 2020-02-19 ENCOUNTER — Ambulatory Visit: Payer: Self-pay

## 2020-02-19 VITALS — BP 135/68 | HR 57

## 2020-02-19 DIAGNOSIS — M461 Sacroiliitis, not elsewhere classified: Secondary | ICD-10-CM | POA: Diagnosis not present

## 2020-02-19 DIAGNOSIS — M47816 Spondylosis without myelopathy or radiculopathy, lumbar region: Secondary | ICD-10-CM | POA: Diagnosis not present

## 2020-02-19 DIAGNOSIS — M48062 Spinal stenosis, lumbar region with neurogenic claudication: Secondary | ICD-10-CM | POA: Diagnosis not present

## 2020-02-19 MED ORDER — BUPIVACAINE HCL 0.5 % IJ SOLN
2.0000 mL | INTRAMUSCULAR | Status: AC | PRN
  Administered 2020-02-19: 2 mL via INTRA_ARTICULAR

## 2020-02-19 MED ORDER — GADOBENATE DIMEGLUMINE 529 MG/ML IV SOLN
20.0000 mL | Freq: Once | INTRAVENOUS | Status: AC | PRN
  Administered 2020-02-19: 20 mL via INTRAVENOUS

## 2020-02-19 MED ORDER — METHYLPREDNISOLONE ACETATE 80 MG/ML IJ SUSP
80.0000 mg | INTRAMUSCULAR | Status: AC | PRN
  Administered 2020-02-19: 80 mg via INTRA_ARTICULAR

## 2020-02-19 NOTE — Progress Notes (Signed)
Travis [name] - 69 y.o. male MRN [id number]  Date of birth: May 20, 1951  Office Visit Note: Visit Date: 02/19/2020 PCP: [name], MD Referred by: [name], MD  Subjective: Chief Complaint  Patient presents with  . Lower Back - Pain   HPI:  Travis Darley. is a 69 y.o. male who comes in today at the request of Dr. [name] for planned Left sacroiliac joint injection with fluoroscopic guidance.  The patient has failed conservative care including home exercise, medications, time and activity modification.  This injection will be diagnostic and hopefully therapeutic.  Please see requesting physician notes for further details and justification.   ROS Otherwise per HPI.  Assessment & Plan: Visit Diagnoses:  1. Sacroiliitis (Travis [name])     Plan: No additional findings.   Meds & Orders: No orders of the defined types were placed in this encounter.   Orders Placed This Encounter  Procedures  . Sacroiliac Joint Inj  . XR C-ARM NO REPORT    Follow-up: Return for visit to requesting physician as needed.   Procedures: Sacroiliac Joint Inj on 02/19/2020 9:09 AM Indications: pain and diagnostic evaluation Details: 22 G 3.5 in needle, fluoroscopy-guided posterior approach Medications: 2 mL bupivacaine 0.5 %; 80 mg methylPREDNISolone acetate 80 MG/ML Outcome: tolerated well, no immediate complications  There was excellent flow of contrast producing a partial arthrogram of the sacroiliac joint.  Procedure, treatment alternatives, risks and benefits explained, specific risks discussed. Consent was given by the patient. Immediately prior to procedure a time out was called to verify the correct patient, procedure, equipment, support staff and site/side marked as required. Patient was prepped and draped in the usual sterile fashion.      No notes on file   Clinical History: MRI LUMBAR SPINE WITHOUT AND WITH CONTRAST  TECHNIQUE: Multiplanar and multiecho pulse  sequences of the lumbar spine were obtained without and with intravenous contrast.  CONTRAST:  63m MULTIHANCE GADOBENATE DIMEGLUMINE 529 MG/ML IV SOLN  COMPARISON:  MRI lumbar spine August 30, 2015  FINDINGS: SEGMENTATION: For the purposes of this report, the last well-formed intervertebral disc is reported as L5-S1.  ALIGNMENT: Maintained lumbar lordosis. Minimal grade 1 L2-3 retrolisthesis without spondylolysis. Broad dextroscoliosis evident on axial sequences.  VERTEBRAE: Vertebral bodies are intact. Severe L5-S1 disc height loss, unchanged. Worsened severe L2-3 and a moderate L4-5 disc height loss with desiccation. Stable mild disc height loss at the remaining levels with desiccation. Multilevel mild and moderate chronic discogenic endplate changes without acute or abnormal bone marrow signal. Mild ventral disc enhancement T11-12 and T12-L1 associated mild disc edema.  CONUS MEDULLARIS AND CAUDA EQUINA: Conus medullaris terminates at T12-L1 and demonstrates normal morphology and signal characteristics. Cauda equina is normal. No abnormal cord, leptomeningeal or epidural enhancement.  PARASPINAL AND OTHER SOFT TISSUES: Nonacute. Moderate to severe lower paraspinal muscle atrophy slightly progressed from prior imaging.  DISC LEVELS:  T12-L1: No disc bulge, canal stenosis nor neural foraminal narrowing.  L1-2: Moderate broad-based disc bulge, superimposed large RIGHT extraforaminal disc protrusion similar to prior examination which could affect the exited RIGHT L1 nerve. Mild facet arthropathy and ligamentum flavum redundancy. Mild canal stenosis. Mild bilateral caudal neural foraminal narrowing.  L2-3: Retrolisthesis. Endplate spurring, mild facet arthropathy and ligamentum flavum redundancy. Minimal canal stenosis. Minimal neural foraminal narrowing.  L3-4: Annular bulging eccentric laterally. Moderate facet arthropathy. No canal stenosis. Mild bilateral  neural foraminal narrowing.  L4-5: Posterior decompression without canal stenosis. Similar to decreased small  broad-based disc bulge. Mild facet arthropathy. Moderate bilateral neural foraminal narrowing. Similar encroachment upon the exited RIGHT L4 nerve.  L5-S1: Endplate spurring without disc bulge. Mild facet arthropathy. No canal stenosis. Mild RIGHT and minimal LEFT neural foraminal narrowing.  IMPRESSION: 1. Status post L4-5 laminectomies. 2. Stable minimal L2-3 retrolisthesis without spondylolysis. Progressed spondylosis without fracture or acute osseous process. 3. Mild canal stenosis L1-2, minimal at L2-3. 4. Neural foraminal narrowing L1-2 through L5-S1: Moderate at L4-5.   Electronically Signed   By: Elon Alas M.D.   On: 09/05/2017 13:57     Objective:  VS:  HT:    WT:   BMI:     BP:135/68  HR:(!) 57bpm  TEMP: ( )  RESP:  Physical Exam Constitutional:      General: He is not in acute distress.    Appearance: Normal appearance. He is not ill-appearing.  HENT:     Head: Normocephalic and atraumatic.     Right Ear: External ear normal.     Left Ear: External ear normal.  Eyes:     Extraocular Movements: Extraocular movements intact.  Cardiovascular:     Rate and Rhythm: Normal rate.     Pulses: Normal pulses.  Abdominal:     General: There is no distension.     Palpations: Abdomen is soft.  Musculoskeletal:        General: No tenderness or signs of injury.     Right lower leg: No edema.     Left lower leg: No edema.     Comments: Patient has good distal strength without clonus on the left. Right AKA. Positive left Patricks  Skin:    Findings: No erythema or rash.  Neurological:     General: No focal deficit present.     Mental Status: He is alert and oriented to person, place, and time.     Sensory: No sensory deficit.     Motor: No weakness or abnormal muscle tone.     Coordination: Coordination normal.  Psychiatric:        Mood  and Affect: Mood normal.        Behavior: Behavior normal.      Imaging: XR C-ARM NO REPORT  Result Date: 02/19/2020 Please see Notes tab for imaging impression.

## 2020-02-19 NOTE — Progress Notes (Signed)
Pt state lower back pain that travels to his left buttock. Pt state walking and standing makes the pain worse. Pt state pain meds and heating pads helps ease the pain.  Numeric Pain Rating Scale and Functional Assessment Average Pain 8   In the last MONTH (on 0-10 scale) has pain interfered with the following?  1. General activity like being  able to carry out your everyday physical activities such as walking, climbing stairs, carrying groceries, or moving a chair?  Rating(9)   +Driver, +BT, -Dye Allergies.

## 2020-02-20 ENCOUNTER — Other Ambulatory Visit: Payer: Self-pay | Admitting: Gastroenterology

## 2020-02-22 ENCOUNTER — Other Ambulatory Visit: Payer: Self-pay

## 2020-02-22 NOTE — Progress Notes (Signed)
error 

## 2020-02-23 ENCOUNTER — Other Ambulatory Visit: Payer: Self-pay

## 2020-02-23 DIAGNOSIS — J322 Chronic ethmoidal sinusitis: Secondary | ICD-10-CM | POA: Diagnosis not present

## 2020-02-23 DIAGNOSIS — J323 Chronic sphenoidal sinusitis: Secondary | ICD-10-CM | POA: Diagnosis not present

## 2020-02-23 DIAGNOSIS — J0130 Acute sphenoidal sinusitis, unspecified: Secondary | ICD-10-CM | POA: Diagnosis not present

## 2020-02-23 DIAGNOSIS — J339 Nasal polyp, unspecified: Secondary | ICD-10-CM | POA: Diagnosis not present

## 2020-02-23 MED ORDER — ZENPEP 40000-126000 UNITS PO CPEP: ORAL_CAPSULE | ORAL | 1 refills | Status: DC

## 2020-02-23 NOTE — Progress Notes (Signed)
Correct dosing on Zenpep

## 2020-02-24 ENCOUNTER — Ambulatory Visit (INDEPENDENT_AMBULATORY_CARE_PROVIDER_SITE_OTHER): Payer: Medicare HMO | Admitting: Internal Medicine

## 2020-02-24 ENCOUNTER — Encounter: Payer: Self-pay | Admitting: Internal Medicine

## 2020-02-24 ENCOUNTER — Other Ambulatory Visit: Payer: Self-pay

## 2020-02-24 VITALS — BP 140/72 | HR 70 | Temp 98.5°F | Ht 72.0 in | Wt 310.0 lb

## 2020-02-24 DIAGNOSIS — F5104 Psychophysiologic insomnia: Secondary | ICD-10-CM

## 2020-02-24 DIAGNOSIS — Z202 Contact with and (suspected) exposure to infections with a predominantly sexual mode of transmission: Secondary | ICD-10-CM

## 2020-02-24 DIAGNOSIS — E785 Hyperlipidemia, unspecified: Secondary | ICD-10-CM | POA: Diagnosis not present

## 2020-02-24 DIAGNOSIS — Z23 Encounter for immunization: Secondary | ICD-10-CM | POA: Diagnosis not present

## 2020-02-24 DIAGNOSIS — E1169 Type 2 diabetes mellitus with other specified complication: Secondary | ICD-10-CM

## 2020-02-24 DIAGNOSIS — E559 Vitamin D deficiency, unspecified: Secondary | ICD-10-CM | POA: Diagnosis not present

## 2020-02-24 DIAGNOSIS — E669 Obesity, unspecified: Secondary | ICD-10-CM | POA: Diagnosis not present

## 2020-02-24 DIAGNOSIS — R739 Hyperglycemia, unspecified: Secondary | ICD-10-CM | POA: Diagnosis not present

## 2020-02-24 LAB — POCT GLYCOSYLATED HEMOGLOBIN (HGB A1C): Hemoglobin A1C: 5.7 % — AB (ref 4.0–5.6)

## 2020-02-24 LAB — URINALYSIS, ROUTINE W REFLEX MICROSCOPIC
Bilirubin Urine: NEGATIVE
Hgb urine dipstick: NEGATIVE
Ketones, ur: NEGATIVE
Leukocytes,Ua: NEGATIVE
Nitrite: NEGATIVE
RBC / HPF: NONE SEEN (ref 0–?)
Specific Gravity, Urine: 1.025 (ref 1.000–1.030)
Total Protein, Urine: NEGATIVE
Urine Glucose: NEGATIVE
Urobilinogen, UA: 0.2 (ref 0.0–1.0)
pH: 6 (ref 5.0–8.0)

## 2020-02-24 LAB — COMPREHENSIVE METABOLIC PANEL
ALT: 15 U/L (ref 0–53)
AST: 15 U/L (ref 0–37)
Albumin: 4.3 g/dL (ref 3.5–5.2)
Alkaline Phosphatase: 57 U/L (ref 39–117)
BUN: 27 mg/dL — ABNORMAL HIGH (ref 6–23)
CO2: 26 mEq/L (ref 19–32)
Calcium: 8.9 mg/dL (ref 8.4–10.5)
Chloride: 103 mEq/L (ref 96–112)
Creatinine, Ser: 1.17 mg/dL (ref 0.40–1.50)
GFR: 63.8 mL/min (ref >60.00)
Glucose, Bld: 106 mg/dL — ABNORMAL HIGH (ref 70–99)
Potassium: 4.4 mEq/L (ref 3.5–5.1)
Sodium: 137 mEq/L (ref 135–145)
Total Bilirubin: 0.4 mg/dL (ref 0.2–1.2)
Total Protein: 6.9 g/dL (ref 6.0–8.3)

## 2020-02-24 LAB — CBC WITH DIFFERENTIAL/PLATELET
Basophils Absolute: 0 10*3/uL (ref 0.0–0.1)
Basophils Relative: 0.6 % (ref 0.0–3.0)
Eosinophils Absolute: 0.1 10*3/uL (ref 0.0–0.7)
Eosinophils Relative: 2 % (ref 0.0–5.0)
HCT: 40.7 % (ref 39.0–52.0)
Hemoglobin: 14 g/dL (ref 13.0–17.0)
Lymphocytes Relative: 27 % (ref 12.0–46.0)
Lymphs Abs: 1.4 10*3/uL (ref 0.7–4.0)
MCHC: 34.4 g/dL (ref 30.0–36.0)
MCV: 94.1 fl (ref 78.0–100.0)
Monocytes Absolute: 0.4 10*3/uL (ref 0.1–1.0)
Monocytes Relative: 7.3 % (ref 3.0–12.0)
Neutro Abs: 3.4 10*3/uL (ref 1.4–7.7)
Neutrophils Relative %: 63.1 % (ref 43.0–77.0)
Platelets: 250 10*3/uL (ref 150.0–400.0)
RBC: 4.33 Mil/uL (ref 4.22–5.81)
RDW: 14.1 % (ref 11.5–15.5)
WBC: 5.3 10*3/uL (ref 4.0–10.5)

## 2020-02-24 LAB — LIPID PANEL
Cholesterol: 134 mg/dL (ref 0–200)
HDL: 59.3 mg/dL (ref >39.00)
LDL Cholesterol: 64 mg/dL (ref 0–99)
NonHDL: 75.07
Total CHOL/HDL Ratio: 2
Triglycerides: 53 mg/dL (ref 0.0–149.0)
VLDL: 10.6 mg/dL (ref 0.0–40.0)

## 2020-02-24 LAB — TSH: TSH: 2.52 u[IU]/mL (ref 0.35–4.50)

## 2020-02-24 LAB — HEMOGLOBIN A1C: Hgb A1c MFr Bld: 5.9 % (ref 4.6–6.5)

## 2020-02-24 LAB — VITAMIN D 25 HYDROXY (VIT D DEFICIENCY, FRACTURES): VITD: 29.84 ng/mL — ABNORMAL LOW (ref 30.00–100.00)

## 2020-02-24 MED ORDER — PHENTERMINE HCL 37.5 MG PO CAPS: 37.5000 mg | ORAL_CAPSULE | ORAL | 2 refills | Status: DC

## 2020-02-24 MED ORDER — ZOLPIDEM TARTRATE 10 MG PO TABS: 10.0000 mg | ORAL_TABLET | Freq: Every evening | ORAL | 1 refills | Status: DC | PRN

## 2020-02-24 NOTE — Progress Notes (Signed)
Subjective:    Patient ID: Travis [name]., male    DOB: Jun 22, 1950, 69 y.o.   MRN: [id number]  HPI  Here to f/u; overall doing ok,  Pt denies chest pain, increasing sob or doe, wheezing, orthopnea, PND, increased LE swelling, palpitations, dizziness or syncope.  Pt denies new neurological symptoms such as new headache, or facial or extremity weakness or numbness.  Pt denies polydipsia, polyuria, or low sugar episode.  Pt states overall good compliance with meds, mostly trying to follow appropriate diet, with wt overall stable,  Sleeping only 3 hrs per night.  Uses cpap, son died at 48yo recently of MI/smoker.  Gained wt 50 lbs back after lost it with surgury.  Seeing counselor and psychiatry.  Due for lumbar urgury soon per dr Louanne Skye then for right shoudler surgury per dr dean.  Melatonin , trazodone 150, klonopin and lunesta all no help.  Asks for std lab f/u today as well though no fever, and Denies urinary symptoms such as dysuria, frequency, urgency, flank pain, hematuria or n/v, fever, chills. Past Medical History:  Diagnosis Date  . ALLERGIC RHINITIS 06/24/2009  . Allergy   . Anemia    IDA  . Anxiety 11/12/2011   Adequate for discharge   . Arthritis    "all my joints" (09/30/2013)  . Arthritis of foot, right, degenerative 04/15/2014  . Balance disorder 03/12/2013  . Benign neoplasm of cecum   . Benign neoplasm of descending colon   . CAD (coronary artery disease) 06/24/2009   5 stents placed in 2007    . Chronic anticoagulation   . Chronic pain syndrome 10/27/2009   of ankle, shoulders, low back.  sciatica.   . Closed fracture of right foot 10/17/2014  . CORONARY ARTERY DISEASE 06/24/2009   a. s/p multiple PCIs - In 2008 he had a Taxus DES to the mild LAD, Endeavor DES to mid LCX and distal LCX. In January 2009 he had DES to distal LCX, mid LCX and proximal LCX. In November 2009 had BMS x 2 to the mid RCA. Cath 10/2011 with patent stents, noncardiac CP. LHC 01/2013: patent stents  (noncardiac CP).  . DEGENERATIVE JOINT DISEASE 06/24/2009   Qualifier: Diagnosis of  By: [name], [name]   . Depression   . Depression with anxiety    Prior suicide attempt(08/25/19-pt states not suicide attempt)  . Pillow DISEASE, LUMBAR 04/19/2010  . ERECTILE DYSFUNCTION, ORGANIC 05/30/2010  . Essential hypertension 06/24/2009   Qualifier: Diagnosis of  By: [name], [name]   . Fibromyalgia   . Fracture dislocation of ankle joint 09/02/2015  . Gait disorder 03/12/2013  . General weakness 07/14/2014  . GERD (gastroesophageal reflux disease) 09/08/2015   08/25/15-pt states was cardiac origin, not GERD  . Hand joint pain 06/10/2013  . Heart murmur   . Hepatitis C    treated pt. unknown with what he was a teenager  . History of kidney stones   . Hyperlipidemia 07/15/2009   Qualifier: Diagnosis of  By: Aundra Dubin, MD, [place]    . HYPERLIPIDEMIA-MIXED 07/15/2009   08/25/19-pt state cholesterol was normal range  . HYPERTENSION 06/24/2009  . Insomnia 10/04/2011  . Irregular heart beat   . Left hip pain 03/12/2013   Injected under ultrasound guidance on June 24, 2013   . Major depression 09/13/2015  . Myocardial infarction ([place]) 2008  . Non-cardiac chest pain 10/2011, 01/2013  . Obesity   . Occult blood positive stool 10/17/2014  . Open ankle  fracture 09/02/2015  . OSA (obstructive sleep apnea)    not using CPAP (09/30/2013)  . Pain of right thumb 04/03/2013  . Pneumonia   . PPD positive 04/08/2015  . Pre-ulcerative corn or callous 02/06/2013  . Rotator cuff tear arthropathy of both shoulders 06/10/2013   History of bilateral shoulder cuff surgery for rotator cuff tears. Reports increase in pain 09/11/2015 during physical therapy of the left shoulder.   [name] SCIATICA, LEFT 04/19/2010   Qualifier: Diagnosis of  By: [name], [name]   . Sleep apnea    wears cpap 08/25/19-States not using due to nasal stuffiness.  [name] Spinal stenosis in cervical region 09/26/2013  . Spinal stenosis, lumbar region, with neurogenic  claudication 09/26/2013  . Type II diabetes mellitus ([place]) 2012   no meds in 09/2014.   [name] Uncontrolled type 2 DM with peripheral circulatory disorder ([place]) 10/04/2013   08/25/19- pt states A1C normal, no diabetes  . URETHRAL STRICTURE 06/24/2009   self catheterizes.    Past Surgical History:  Procedure Laterality Date  . AMPUTATION Right 06/14/2016   Procedure: AMPUTATION BELOW KNEE;  Surgeon: Newt Minion, MD;  Location: [place];  Service: Orthopedics;  Laterality: Right;  . ANKLE FUSION Right 04/15/2014   Procedure: Right Subtalar, Talonavicular Fusion;  Surgeon: Newt Minion, MD;  Location: [place];  Service: Orthopedics;  Laterality: Right;  . ANKLE FUSION Right 04/18/2016   Procedure: Right Ankle Tibiocalcaneal Fusion;  Surgeon: Newt Minion, MD;  Location: [place];  Service: Orthopedics;  Laterality: Right;  . ANTERIOR CERVICAL DECOMP/DISCECTOMY FUSION N/A 09/26/2013   Procedure: ANTERIOR CERVICAL DISCECTOMY FUSION C3-4, plate and screw fixation, allograft bone graft;  Surgeon: Jessy Oto, MD;  Location: [place];  Service: Orthopedics;  Laterality: N/A;  . BACK SURGERY     3  . BELOW KNEE LEG AMPUTATION Right 06/14/2016   right ankle and foot  . CARDIAC CATHETERIZATION  X 1  . CARPAL TUNNEL RELEASE Bilateral   . COLONOSCOPY N/A 10/22/2014   Procedure: COLONOSCOPY;  Surgeon: Lafayette Dragon, MD;  Location: [facility] ENDOSCOPY;  Service: Endoscopy;  Laterality: N/A;  . COLONOSCOPY  11/19/2018  . CORONARY ANGIOPLASTY WITH STENT PLACEMENT     "I have 9 stents"  . ESOPHAGOGASTRODUODENOSCOPY N/A 10/19/2014   Procedure: ESOPHAGOGASTRODUODENOSCOPY (EGD);  Surgeon: Jerene Bears, MD;  Location: [facility] ENDOSCOPY;  Service: Endoscopy;  Laterality: N/A;  . FRACTURE SURGERY    . FUSION OF TALONAVICULAR JOINT Right 04/15/2014   dr duda  . GASTRIC BYPASS  02/20/2019  . HERNIA REPAIR     umbilical  . INGUINAL HERNIA REPAIR Right 05/11/2015   Procedure: LAPAROSCOPIC REPAIR RIGHT  INGUINAL HERNIA;  Surgeon: Greer Pickerel,  MD;  Location: [place];  Service: General;  Laterality: Right;  . INSERTION OF MESH Right 05/11/2015   Procedure: INSERTION OF MESH;  Surgeon: Greer Pickerel, MD;  Location: [place];  Service: General;  Laterality: Right;  . JOINT REPLACEMENT    . KNEE CARTILAGE SURGERY Right X 12   "~ 1/2 open; ~ 1/2 scopes"  . KNEE CARTILAGE SURGERY Left X 3   "3 scopes"  . LAPAROSCOPIC GASTRIC SLEEVE RESECTION N/A 03/03/2019   Procedure: LAPAROSCOPIC GASTRIC SLEEVE RESECTION, Upper Endo, ERAS Pathway;  Surgeon: Greer Pickerel, MD;  Location: [facility];  Service: General;  Laterality: N/A;  . LEFT HEART CATHETERIZATION WITH CORONARY ANGIOGRAM N/A 02/10/2013   Procedure: LEFT HEART CATHETERIZATION WITH CORONARY ANGIOGRAM;  Surgeon: Burnell Blanks, MD;  Location:  [place] CATH LAB;  Service: Cardiovascular;  Laterality: N/A;  . LUMBAR LAMINECTOMY N/A 07/16/2018   Procedure: LEFT L4-5 REDO PARTIAL LUMBAR HEMILAMINECTOMY WITH FORAMINOTOMY LEFT L4;  Surgeon: Jessy Oto, MD;  Location: [place];  Service: Orthopedics;  Laterality: N/A;  . LUMBAR LAMINECTOMY/DECOMPRESSION MICRODISCECTOMY N/A 01/27/2014   Procedure: CENTRAL LUMBAR LAMINECTOMY L4-5 AND L3-4;  Surgeon: Jessy Oto, MD;  Location: [place];  Service: Orthopedics;  Laterality: N/A;  . ORIF ANKLE FRACTURE Right 09/02/2015   Procedure: OPEN REDUCTION INTERNAL FIXATION (ORIF) ANKLE FRACTURE;  Surgeon: Newt Minion, MD;  Location: [place];  Service: Orthopedics;  Laterality: Right;  . PERIPHERALLY INSERTED CENTRAL CATHETER INSERTION  09/02/2015  . POLYPECTOMY    . ROTATOR CUFF REPAIR Left    x 2  . ROTATOR CUFF REPAIR Right    x 3  . SHOULDER ARTHROSCOPY W/ ROTATOR CUFF REPAIR Bilateral    "3 on the right; 1 on the left"  . SKIN SPLIT GRAFT Right 10/01/2015   Procedure: RIGHT ANKLE APPLY SKIN GRAFT SPLIT THICKNESS;  Surgeon: Newt Minion, MD;  Location: [place];  Service: Orthopedics;  Laterality: Right;  . TONSILLECTOMY    . TOOTH EXTRACTION    . TOTAL HIP  ARTHROPLASTY Right 04/17/2018  . TOTAL HIP ARTHROPLASTY Right 04/17/2018   Procedure: RIGHT TOTAL HIP ARTHROPLASTY;  Surgeon: Newt Minion, MD;  Location: [place];  Service: Orthopedics;  Laterality: Right;  . TOTAL HIP ARTHROPLASTY Left 06/06/2019   Procedure: LEFT TOTAL HIP ARTHROPLASTY ANTERIOR APPROACH;  Surgeon: Mcarthur Rossetti, MD;  Location: [place];  Service: Orthopedics;  Laterality: Left;  . TOTAL HIP REVISION Left 05/24/2019  . TOTAL KNEE ARTHROPLASTY Bilateral 2008  . UMBILICAL HERNIA REPAIR     UHR  . UPPER GASTROINTESTINAL ENDOSCOPY  2016  . URETHRAL DILATION  X 4  . VASECTOMY    . WISDOM TOOTH EXTRACTION      reports that he quit smoking about 9 years ago. His smoking use included cigars. He has never used smokeless tobacco. He reports previous alcohol use. He reports that he does not use drugs. family history includes Breast cancer in his mother; Colon polyps in his brother; Coronary artery disease in an other family member; Depression in his brother, mother, and another family member; Diabetes in his father; Early death in his maternal grandfather, paternal grandfather, and son; Esophageal cancer in his mother; Healthy in his son; Heart attack (age of onset: 52) in his son; Heart attack (age of onset: 85) in his maternal grandfather; Heart disease in his father, maternal grandfather, and mother; Hyperlipidemia in his father; Hypertension in his brother, father, mother, and another family member; Pancreatic cancer in his mother; Prostate cancer in his father; Skin cancer in his father; Stomach cancer in his mother. No Known Allergies Current Outpatient Medications on File Prior to Visit  Medication Sig Dispense Refill  . albuterol (VENTOLIN HFA) 108 (90 Base) MCG/ACT inhaler Inhale 2 puffs into the lungs every 6 (six) hours as needed for wheezing or shortness of breath. 18 g 1  . amoxicillin (AMOXIL) 500 MG capsule daily as needed. AS NEEDED FOR DENTAL WORK    . ARIPiprazole  (ABILIFY) 5 MG tablet Take 1 tablet (5 mg total) by mouth at bedtime. 90 tablet 0  . aspirin 81 MG chewable tablet Chew 1 tablet (81 mg total) by mouth daily. 35 tablet 0  . atorvastatin (LIPITOR) 20 MG tablet Take 1 tablet (20 mg total) by mouth daily.  90 tablet 1  . benztropine (COGENTIN) 0.5 MG tablet Take 1 tablet (0.5 mg total) by mouth at bedtime. 90 tablet 0  . calcium carbonate (TUMS - DOSED IN MG ELEMENTAL CALCIUM) 500 MG chewable tablet Chew 1,000 mg by mouth at bedtime.    . Calcium Polycarbophil (FIBER-CAPS PO) Take 3 capsules by mouth at bedtime.    . celecoxib (CELEBREX) 200 MG capsule Take 1 capsule (200 mg total) by mouth 2 (two) times daily. 180 capsule 3  . DULoxetine (CYMBALTA) 30 MG capsule Take 1 capsule (30 mg total) by mouth 2 (two) times daily. 180 capsule 0  . gabapentin (NEURONTIN) 800 MG tablet Take 800 mg by mouth 3 (three) times daily.     . hydrochlorothiazide (HYDRODIURIL) 25 MG tablet Take 1 tablet (25 mg total) by mouth daily. 90 tablet 3  . HYDROcodone-acetaminophen (NORCO) 10-325 MG tablet Take 1 tablet by mouth every 8 (eight) hours as needed for moderate pain.     . iron polysaccharides (NU-IRON) 150 MG capsule Take 1 capsule (150 mg total) by mouth daily. 90 capsule 1  . Melatonin 5 MG TABS Take 20 mg by mouth at bedtime.     . meloxicam (MOBIC) 15 MG tablet Take 15 mg by mouth daily.    . methocarbamol (ROBAXIN) 500 MG tablet Take 1 tablet (500 mg total) by mouth every 8 (eight) hours as needed for muscle spasms. 90 tablet 1  . Multiple Vitamins-Minerals (BARIATRIC MULTIVITAMINS/IRON PO) Take 1 tablet by mouth daily.    [name] MYRBETRIQ 50 MG TB24 tablet Take 50 mg by mouth at bedtime.     . Omega-3 Fatty Acids (FISH OIL) 1000 MG CAPS Take 1,000 mg by mouth at bedtime.     [name] oxybutynin (DITROPAN-XL) 10 MG 24 hr tablet Take 10 mg by mouth daily.    [name] oxymetazoline (AFRIN) 0.05 % nasal spray Place 1 spray into both nostrils 2 (two) times daily as needed for  congestion.    . Pancrelipase, Lip-Prot-Amyl, (ZENPEP) 40000-126000 units CPEP Take 2 capsules (80,000 units) with meals and 1 capsule (40,000 units) with each snack 720 capsule 1  . pantoprazole (PROTONIX) 40 MG tablet Take 1 tablet (40 mg total) by mouth 2 (two) times daily. 60 tablet 2  . prasugrel (EFFIENT) 5 MG TABS tablet Take 1 tablet (5 mg total) by mouth daily. 90 tablet 3  . sildenafil (VIAGRA) 100 MG tablet Take 100 mg by mouth daily as needed for erectile dysfunction.     . Testosterone 1.62 % GEL Place 1 Pump onto the skin daily. 88 g 1  . traZODone (DESYREL) 150 MG tablet Take 1 tablet (150 mg total) by mouth at bedtime. 90 tablet 0   No current facility-administered medications on file prior to visit.   Review of Systems All otherwise neg per pt     Objective:   Physical Exam BP 140/72 (BP Location: Left Arm, Patient Position: Sitting, Cuff Size: Normal)   Pulse 70   Temp 98.5 F (36.9 C) (Oral)   Ht 6' (1.829 m)   Wt (!) 310 lb (140.6 kg)   SpO2 97%   BMI 42.04 kg/m  VS noted,  Constitutional: Pt appears in NAD HENT: Head: NCAT.  Right Ear: External ear normal.  Left Ear: External ear normal.  Eyes: . Pupils are equal, round, and reactive to light. Conjunctivae and EOM are normal Nose: without d/c or deformity Neck: Neck supple. Gross normal ROM Cardiovascular: Normal rate and regular rhythm.  Pulmonary/Chest: Effort normal and breath sounds without rales or wheezing.  Abd:  Soft, NT, ND, + BS, no organomegaly Neurological: Pt is alert. At baseline orientation, motor grossly intact Skin: Skin is warm. No rashes, other new lesions, no LE edema Psychiatric: Pt behavior is normal without agitation  All otherwise neg per pt Lab Results  Component Value Date   WBC 5.3 02/24/2020   HGB 14.0 02/24/2020   HCT 40.7 02/24/2020   PLT 250.0 02/24/2020   GLUCOSE 106 (H) 02/24/2020   CHOL 134 02/24/2020   TRIG 53.0 02/24/2020   HDL 59.30 02/24/2020   LDLCALC 64  02/24/2020   ALT 15 02/24/2020   AST 15 02/24/2020   NA 137 02/24/2020   K 4.4 02/24/2020   CL 103 02/24/2020   CREATININE 1.17 02/24/2020   BUN 27 (H) 02/24/2020   CO2 26 02/24/2020   TSH 2.52 02/24/2020   PSA 0.56 07/24/2019   INR 1.1 06/03/2019   HGBA1C 5.7 (A) 02/24/2020   MICROALBUR <0.7 07/24/2019      Assessment & Plan:

## 2020-02-24 NOTE — Patient Instructions (Signed)
We will send the ambien and phentermine when the computer program is running again  Please continue all other medications as before, and refills have been done if requested.  Please have the pharmacy call with any other refills you may need.  Please continue your efforts at being more active, low cholesterol diet, and weight control.  You are otherwise up to date with prevention measures today.  Please keep your appointments with your specialists as you may have planned   Please go to the LAB at the blood drawing area for the tests to be done  You will be contacted by phone if any changes need to be made immediately.  Otherwise, you will receive a letter about your results with an explanation, but please check with MyChart first.  Please remember to sign up for MyChart if you have not done so, as this will be important to you in the future with finding out test results, communicating by private email, and scheduling acute appointments online when needed.  Please make an Appointment to return in 6 months, or sooner if needed

## 2020-02-25 ENCOUNTER — Encounter: Payer: Self-pay | Admitting: Internal Medicine

## 2020-02-25 LAB — HSV 2 ANTIBODY, IGG: HSV 2 Glycoprotein G Ab, IgG: <0.9 index

## 2020-02-25 LAB — HIV ANTIBODY (ROUTINE TESTING W REFLEX): HIV 1&2 Ab, 4th Generation: NONREACTIVE

## 2020-02-25 LAB — RPR: RPR Ser Ql: NONREACTIVE

## 2020-02-26 ENCOUNTER — Other Ambulatory Visit: Payer: Self-pay

## 2020-02-26 ENCOUNTER — Ambulatory Visit (INDEPENDENT_AMBULATORY_CARE_PROVIDER_SITE_OTHER): Payer: Medicare HMO | Admitting: Specialist

## 2020-02-26 ENCOUNTER — Encounter: Payer: Self-pay | Admitting: Specialist

## 2020-02-26 VITALS — BP 139/74 | HR 94 | Ht 72.0 in | Wt 310.0 lb

## 2020-02-26 DIAGNOSIS — Z96642 Presence of left artificial hip joint: Secondary | ICD-10-CM | POA: Diagnosis not present

## 2020-02-26 DIAGNOSIS — M47818 Spondylosis without myelopathy or radiculopathy, sacral and sacrococcygeal region: Secondary | ICD-10-CM | POA: Diagnosis not present

## 2020-02-26 DIAGNOSIS — M48062 Spinal stenosis, lumbar region with neurogenic claudication: Secondary | ICD-10-CM | POA: Diagnosis not present

## 2020-02-26 DIAGNOSIS — M25511 Pain in right shoulder: Secondary | ICD-10-CM

## 2020-02-26 DIAGNOSIS — G8929 Other chronic pain: Secondary | ICD-10-CM | POA: Diagnosis not present

## 2020-02-26 DIAGNOSIS — M2170 Unequal limb length (acquired), unspecified site: Secondary | ICD-10-CM | POA: Diagnosis not present

## 2020-02-26 NOTE — Progress Notes (Signed)
Office Visit Note   Patient: Travis [name].           Date of Birth: 12/07/1950           MRN: [id number] Visit Date: 02/26/2020              Requested by: [name], MD [place],  [place] [address] PCP: [name], MD   Assessment & Plan: Visit Diagnoses:  1. Spinal stenosis of lumbar region with neurogenic claudication   2. Arthropathy of left sacroiliac joint   3. Chronic right shoulder pain   4. Status post total replacement of left hip   5. Acquired leg length discrepancy     Plan: Avoid bending, stooping and avoid lifting weights greater than 10 lbs. Avoid prolong standing and walking. Avoid frequent bending and stooping  No lifting greater than 10 lbs. May use ice or moist heat for pain. Weight loss is of benefit. Handicap license is approved. Dr. Romona Curls secretary/Assistant will call to arrange for left L4 epidural steroid injection   Follow-Up Instructions: Return in about 3 weeks (around 03/18/2020).   Orders:  Orders Placed This Encounter  Procedures  . Ambulatory referral to Physical Medicine Rehab   No orders of the defined types were placed in this encounter.     Procedures: No procedures performed   Clinical Data: No additional findings.   Subjective: Chief Complaint  Patient presents with  . Lower Back - Follow-up    Had a Left SI joint injection that has helped some, still has trouble with walking de to back pain, MRI Review    69 year old male with history of L4-5 central laminectomy for stenosis with good relief has been experiencing increasing pain into the left upper buttock medially in the area of the PSIS and SI joint. Underwent left SI injection by Dr. Ernestina Patches with about 30-40% relief of pain. Pain is worse with standing and walking. No bowel or bladder concerns. No numbness or tingling just constant pain into the left upper buttock.  Pain increases with walking and improves with lying down more than  sitting. It is painful to sit. Walking is out of the question due to pain.    Review of Systems  Constitutional: Negative.   HENT: Positive for postnasal drip, rhinorrhea, sinus pressure and sinus pain (Has nasal polyps and ENT wants to consider surgery. ).   Eyes: Negative.   Respiratory: Negative.   Cardiovascular: Negative.   Gastrointestinal: Negative.   Endocrine: Negative.   Genitourinary: Negative.   Musculoskeletal: Positive for back pain and gait problem.  Skin: Negative.   Allergic/Immunologic: Positive for environmental allergies.  Neurological: Positive for weakness. Negative for numbness.  Hematological: Negative.   Psychiatric/Behavioral: Negative.  Negative for agitation, behavioral problems, confusion, decreased concentration, dysphoric mood, hallucinations, self-injury, sleep disturbance and suicidal ideas. The patient is not nervous/anxious and is not hyperactive.      Objective: Vital Signs: BP 139/74 (BP Location: Left Arm, Patient Position: Sitting)   Pulse 94   Ht 6' (1.829 m)   Wt (!) 310 lb (140.6 kg)   BMI 42.04 kg/m   Physical Exam Musculoskeletal:     Lumbar back: Negative right straight leg raise test and negative left straight leg raise test.     Back Exam   Tenderness  The patient is experiencing tenderness in the lumbar.  Range of Motion  Extension: abnormal  Flexion: normal  Lateral bend right: abnormal  Lateral  bend left: abnormal  Rotation right: abnormal  Rotation left: abnormal   Muscle Strength  Right Quadriceps:  5/5  Left Quadriceps:  5/5  Right Hamstrings:  5/5  Left Hamstrings:  5/5   Tests  Straight leg raise right: negative Straight leg raise left: negative  Reflexes  Patellar: 1/4 Achilles: 0/4 Biceps: 0/4  Other  Toe walk: normal Heel walk: normal Sensation: normal Gait: normal  Erythema: no back redness Scars: absent      Specialty Comments:  No specialty comments available.  Imaging: No  results found.   PMFS History: Patient Active Problem List   Diagnosis Date Noted  . Other spondylosis with radiculopathy, lumbar region 07/16/2018    Priority: High    Class: Chronic  . Spinal stenosis in cervical region 09/26/2013    Priority: High    Class: Chronic  . Spinal stenosis of lumbar region 09/26/2013    Priority: High    Class: Chronic  . Vitamin D deficiency 02/24/2020  . Cough 07/29/2019  . Wheezing 07/29/2019  . Possible exposure to STD 07/24/2019  . Unilateral primary osteoarthritis, left hip 06/06/2019  . Status post total replacement of left hip 06/06/2019  . S/P laparoscopic sleeve gastrectomy 03/03/2019  . Rash 01/24/2019  . Fever 08/15/2018  . UTI (urinary tract infection) 08/15/2018  . Fall at home, initial encounter 08/15/2018  . Chronic anemia 08/15/2018  . Hypokalemia 08/15/2018  . Bowel incontinence 07/25/2018  . Status post lumbar laminectomy 07/16/2018  . Status post THR (total hip replacement) 04/17/2018  . Unilateral primary osteoarthritis, right hip   . Morbid obesity with BMI of 40.0-44.9, adult ([place]) 02/28/2018  . Myocardial infarction ([place]) 02/25/2018  . Coronary artery disease involving native coronary artery of native heart 02/25/2018  . PAD (peripheral artery disease) ([place]) 02/25/2018  . S/P BKA (below knee amputation) unilateral, right ([place]) 02/25/2018  . Sacroiliitis ([place]) 02/25/2018  . SOB (shortness of breath) 01/03/2018  . Acute on chronic heart failure ([place]) 01/03/2018  . Severe right groin pain 12/20/2017  . Morbid obesity ([place]) 10/09/2017  . Low back pain 07/13/2017  . Leukoplakia, tongue 01/26/2017  . Hyperglycemia 10/20/2016  . Skin lesion 10/20/2016  . S/P unilateral BKA (below knee amputation), right ([place]) 06/14/2016  . Charcot foot due to diabetes mellitus ([place])   . Charcot's arthropathy associated with type 2 diabetes mellitus ([place]) 04/11/2016  . Preventative health care 11/05/2015  . Major depression 09/13/2015    . S/P TKR (total knee replacement) bilaterally 09/13/2015  . GERD (gastroesophageal reflux disease) 09/08/2015  . S/P laparoscopic hernia repair 05/11/2015  . PPD positive 04/08/2015  . Benign neoplasm of descending colon   . Benign neoplasm of cecum   . Acute blood loss anemia   . Chronic anticoagulation   . Occult blood positive stool 10/17/2014  . General weakness 07/14/2014  . Urinary incontinence 07/14/2014  . Headache(784.0) 10/15/2013  . Hand joint pain 06/10/2013  . Rotator cuff tear arthropathy of both shoulders 06/10/2013  . Skin lesion of cheek 05/01/2013  . Pain of right thumb 04/03/2013  . Balance disorder 03/12/2013  . Gait disorder 03/12/2013  . Tremor 03/12/2013  . Left hip pain 03/12/2013  . Pre-ulcerative corn or callous 02/06/2013  . Anxiety 11/12/2011  . OSA on CPAP 11/07/2011  . Bradycardia 10/20/2011  . Insomnia 10/04/2011  . Obesity 01/12/2011  . Diabetes mellitus type 2 in obese ([place]) 09/27/2010  . ERECTILE DYSFUNCTION, ORGANIC 05/30/2010  . [place] DISEASE, LUMBAR 04/19/2010  . SCIATICA,  LEFT 04/19/2010  . Chronic pain syndrome 10/27/2009  . Hyperlipidemia 07/15/2009  . Essential hypertension 06/24/2009  . Coronary artery disease involving native coronary artery of native heart without angina pectoris 06/24/2009  . Allergic rhinitis 06/24/2009  . URETHRAL STRICTURE 06/24/2009  . DEGENERATIVE JOINT DISEASE 06/24/2009  . SHOULDER PAIN, BILATERAL 06/24/2009  . FATIGUE 06/24/2009  . NEPHROLITHIASIS, HX OF 06/24/2009   Past Medical History:  Diagnosis Date  . ALLERGIC RHINITIS 06/24/2009  . Allergy   . Anemia    IDA  . Anxiety 11/12/2011   Adequate for discharge   . Arthritis    "all my joints" (09/30/2013)  . Arthritis of foot, right, degenerative 04/15/2014  . Balance disorder 03/12/2013  . Benign neoplasm of cecum   . Benign neoplasm of descending colon   . CAD (coronary artery disease) 06/24/2009   5 stents placed in 2007    . Chronic  anticoagulation   . Chronic pain syndrome 10/27/2009   of ankle, shoulders, low back.  sciatica.   . Closed fracture of right foot 10/17/2014  . CORONARY ARTERY DISEASE 06/24/2009   a. s/p multiple PCIs - In 2008 he had a Taxus DES to the mild LAD, Endeavor DES to mid LCX and distal LCX. In January 2009 he had DES to distal LCX, mid LCX and proximal LCX. In November 2009 had BMS x 2 to the mid RCA. Cath 10/2011 with patent stents, noncardiac CP. LHC 01/2013: patent stents (noncardiac CP).  . DEGENERATIVE JOINT DISEASE 06/24/2009   Qualifier: Diagnosis of  By: [name], [name]   . Depression   . Depression with anxiety    Prior suicide attempt(08/25/19-pt states not suicide attempt)  . [place] DISEASE, LUMBAR 04/19/2010  . ERECTILE DYSFUNCTION, ORGANIC 05/30/2010  . Essential hypertension 06/24/2009   Qualifier: Diagnosis of  By: [name], [name]   . Fibromyalgia   . Fracture dislocation of ankle joint 09/02/2015  . Gait disorder 03/12/2013  . General weakness 07/14/2014  . GERD (gastroesophageal reflux disease) 09/08/2015   08/25/15-pt states was cardiac origin, not GERD  . Hand joint pain 06/10/2013  . Heart murmur   . Hepatitis C    treated pt. unknown with what he was a teenager  . History of kidney stones   . Hyperlipidemia 07/15/2009   Qualifier: Diagnosis of  By: Aundra Dubin, MD, [place]    . HYPERLIPIDEMIA-MIXED 07/15/2009   08/25/19-pt state cholesterol was normal range  . HYPERTENSION 06/24/2009  . Insomnia 10/04/2011  . Irregular heart beat   . Left hip pain 03/12/2013   Injected under ultrasound guidance on June 24, 2013   . Major depression 09/13/2015  . Myocardial infarction ([place]) 2008  . Non-cardiac chest pain 10/2011, 01/2013  . Obesity   . Occult blood positive stool 10/17/2014  . Open ankle fracture 09/02/2015  . OSA (obstructive sleep apnea)    not using CPAP (09/30/2013)  . Pain of right thumb 04/03/2013  . Pneumonia   . PPD positive 04/08/2015  . Pre-ulcerative corn or callous 02/06/2013  .  Rotator cuff tear arthropathy of both shoulders 06/10/2013   History of bilateral shoulder cuff surgery for rotator cuff tears. Reports increase in pain 09/11/2015 during physical therapy of the left shoulder.   [name] SCIATICA, LEFT 04/19/2010   Qualifier: Diagnosis of  By: [name], [name]   . Sleep apnea    wears cpap 08/25/19-States not using due to nasal stuffiness.  [name] Spinal stenosis in cervical region 09/26/2013  .  Spinal stenosis, lumbar region, with neurogenic claudication 09/26/2013  . Type II diabetes mellitus ([place]) 07/28/2010   no meds in 09/2014.   [name] Uncontrolled type 2 DM with peripheral circulatory disorder ([place]) 10/04/2013   08/25/19- pt states A1C normal, no diabetes  . URETHRAL STRICTURE 06/24/2009   self catheterizes.     Family History  Problem Relation Age of Onset  . Depression Mother   . Heart disease Mother   . Hypertension Mother   . Breast cancer Mother        primary cancer  . Stomach cancer Mother   . Esophageal cancer Mother   . Pancreatic cancer Mother   . Diabetes Father   . Heart disease Father        CABG  . Hypertension Father   . Hyperlipidemia Father   . Prostate cancer Father   . Skin cancer Father   . Depression Brother        x 2  . Hypertension Brother        x2  . Colon polyps Brother   . Heart attack Son 66  . Early death Son   . Heart disease Maternal Grandfather   . Early death Maternal Grandfather   . Heart attack Maternal Grandfather 07-29-63  . Early death Paternal Grandfather   . Coronary artery disease Other   . Hypertension Other   . Depression Other   . Healthy Son   . Colon cancer Neg Hx   . Rectal cancer Neg Hx     Past Surgical History:  Procedure Laterality Date  . AMPUTATION Right 06/14/2016   Procedure: AMPUTATION BELOW KNEE;  Surgeon: Newt Minion, MD;  Location: [facility];  Service: Orthopedics;  Laterality: Right;  . ANKLE FUSION Right 04/15/2014   Procedure: Right Subtalar, Talonavicular Fusion;  Surgeon: Newt Minion, MD;  Location:  [facility];  Service: Orthopedics;  Laterality: Right;  . ANKLE FUSION Right 04/18/2016   Procedure: Right Ankle Tibiocalcaneal Fusion;  Surgeon: Newt Minion, MD;  Location: [facility];  Service: Orthopedics;  Laterality: Right;  . ANTERIOR CERVICAL DECOMP/DISCECTOMY FUSION N/A 09/26/2013   Procedure: ANTERIOR CERVICAL DISCECTOMY FUSION C3-4, plate and screw fixation, allograft bone graft;  Surgeon: Jessy Oto, MD;  Location: [facility];  Service: Orthopedics;  Laterality: N/A;  . BACK SURGERY     3  . BELOW KNEE LEG AMPUTATION Right 06/14/2016   right ankle and foot  . CARDIAC CATHETERIZATION  X 1  . CARPAL TUNNEL RELEASE Bilateral   . COLONOSCOPY N/A 10/22/2014   Procedure: COLONOSCOPY;  Surgeon: Lafayette Dragon, MD;  [facility] ENDOSCOPY;  Service: Endoscopy;  Laterality: N/A;  . COLONOSCOPY  11/19/2018  . CORONARY ANGIOPLASTY WITH STENT PLACEMENT     "I have 9 stents"  . ESOPHAGOGASTRODUODENOSCOPY N/A 10/19/2014   Procedure: ESOPHAGOGASTRODUODENOSCOPY (EGD);  Surgeon: Jerene Bears, MD;  Location: [facility] ENDOSCOPY;  Service: Endoscopy;  Laterality: N/A;  . FRACTURE SURGERY    . FUSION OF TALONAVICULAR JOINT Right 04/15/2014   dr duda  . GASTRIC BYPASS  02/20/2019  . HERNIA REPAIR     umbilical  . INGUINAL HERNIA REPAIR Right 05/11/2015   Procedure: LAPAROSCOPIC REPAIR RIGHT  INGUINAL HERNIA;  Surgeon: Greer Pickerel, MD;  Location: [place];  Service: General;  Laterality: Right;  . INSERTION OF MESH Right 05/11/2015   Procedure: INSERTION OF MESH;  Surgeon: Greer Pickerel, MD;  Location: [place];  Service: General;  Laterality: Right;  . JOINT REPLACEMENT    .  KNEE CARTILAGE SURGERY Right X 12   "~ 1/2 open; ~ 1/2 scopes"  . KNEE CARTILAGE SURGERY Left X 3   "3 scopes"  . LAPAROSCOPIC GASTRIC SLEEVE RESECTION N/A 03/03/2019   Procedure: LAPAROSCOPIC GASTRIC SLEEVE RESECTION, Upper Endo, ERAS Pathway;  Surgeon: Greer Pickerel, MD;  Location: [facility];  Service: General;  Laterality: N/A;  . LEFT HEART  CATHETERIZATION WITH CORONARY ANGIOGRAM N/A 02/10/2013   Procedure: LEFT HEART CATHETERIZATION WITH CORONARY ANGIOGRAM;  Surgeon: Burnell Blanks, MD;  Location[facility] CATH LAB;  Service: Cardiovascular;  Laterality: N/A;  . LUMBAR LAMINECTOMY N/A 07/16/2018   Procedure: LEFT L4-5 REDO PARTIAL LUMBAR HEMILAMINECTOMY WITH FORAMINOTOMY LEFT L4;  Surgeon: Jessy Oto, MD;  Location: [place];  Service: Orthopedics;  Laterality: N/A;  . LUMBAR LAMINECTOMY/DECOMPRESSION MICRODISCECTOMY N/A 01/27/2014   Procedure: CENTRAL LUMBAR LAMINECTOMY L4-5 AND L3-4;  Surgeon: Jessy Oto, MD;  Location: [place];  Service: Orthopedics;  Laterality: N/A;  . ORIF ANKLE FRACTURE Right 09/02/2015   Procedure: OPEN REDUCTION INTERNAL FIXATION (ORIF) ANKLE FRACTURE;  Surgeon: Newt Minion, MD;  Location: [place];  Service: Orthopedics;  Laterality: Right;  . PERIPHERALLY INSERTED CENTRAL CATHETER INSERTION  09/02/2015  . POLYPECTOMY    . ROTATOR CUFF REPAIR Left    x 2  . ROTATOR CUFF REPAIR Right    x 3  . SHOULDER ARTHROSCOPY W/ ROTATOR CUFF REPAIR Bilateral    "3 on the right; 1 on the left"  . SKIN SPLIT GRAFT Right 10/01/2015   Procedure: RIGHT ANKLE APPLY SKIN GRAFT SPLIT THICKNESS;  Surgeon: Newt Minion, MD;  Location: [place];  Service: Orthopedics;  Laterality: Right;  . TONSILLECTOMY    . TOOTH EXTRACTION    . TOTAL HIP ARTHROPLASTY Right 04/17/2018  . TOTAL HIP ARTHROPLASTY Right 04/17/2018   Procedure: RIGHT TOTAL HIP ARTHROPLASTY;  Surgeon: Newt Minion, MD;  Location: [place];  Service: Orthopedics;  Laterality: Right;  . TOTAL HIP ARTHROPLASTY Left 06/06/2019   Procedure: LEFT TOTAL HIP ARTHROPLASTY ANTERIOR APPROACH;  Surgeon: Mcarthur Rossetti, MD;  Location: [place];  Service: Orthopedics;  Laterality: Left;  . TOTAL HIP REVISION Left 05/24/2019  . TOTAL KNEE ARTHROPLASTY Bilateral 2008  . UMBILICAL HERNIA REPAIR     UHR  . UPPER GASTROINTESTINAL ENDOSCOPY  2016  . URETHRAL DILATION  X 4  .  VASECTOMY    . WISDOM TOOTH EXTRACTION     Social History   Occupational History  . Occupation: disabled since 2006 due to ortho. heart, psych    Employer: UNEMPLOYED  . Occupation: part time work as an Multimedia programmer, wrestling, and Holiday representative  Tobacco Use  . Smoking status: Former Smoker    Types: Cigars    Quit date: 08/28/2010    Years since quitting: 9.5  . Smokeless tobacco: Never Used  . Tobacco comment: 04/18/2016 "smoked 1 cigar/wk when I did smoke"  Vaping Use  . Vaping Use: Never used  Substance and Sexual Activity  . Alcohol use: Not Currently    Alcohol/week: 0.0 standard drinks    Comment: rarely  . Drug use: No  . Sexual activity: Not Currently

## 2020-02-26 NOTE — Patient Instructions (Signed)
Avoid bending, stooping and avoid lifting weights greater than 10 lbs. Avoid prolong standing and walking. Avoid frequent bending and stooping  No lifting greater than 10 lbs. May use ice or moist heat for pain. Weight loss is of benefit. Handicap license is approved. Dr. Romona Curls secretary/Assistant will call to arrange for left L4 epidural steroid injection

## 2020-02-27 LAB — GC/CHLAMYDIA PROBE AMP
Chlamydia trachomatis, NAA: NEGATIVE
Neisseria Gonorrhoeae by PCR: NEGATIVE

## 2020-02-29 ENCOUNTER — Encounter: Payer: Self-pay | Admitting: Internal Medicine

## 2020-02-29 NOTE — Assessment & Plan Note (Signed)
For std testing

## 2020-02-29 NOTE — Assessment & Plan Note (Signed)
stable overall by history and exam, recent data reviewed with pt, and pt to continue medical treatment as before,  to f/u any worsening symptoms or concerns  

## 2020-02-29 NOTE — Assessment & Plan Note (Addendum)
For ambien qhs prn  I spent 41 minutes in preparing to see the patient by review of recent labs, imaging and procedures, obtaining and reviewing separately obtained history, communicating with the patient and family or caregiver, ordering medications, tests or procedures, and documenting clinical information in the EHR including the differential Dx, treatment, and any further evaluation and other management of insomnia, dm, hld, obeisty, std exposure,

## 2020-02-29 NOTE — Assessment & Plan Note (Addendum)
Needs to avoid excess calories, for phentermine course

## 2020-02-29 NOTE — Assessment & Plan Note (Signed)
For vit d 2000 u qd

## 2020-03-01 DIAGNOSIS — N410 Acute prostatitis: Secondary | ICD-10-CM | POA: Diagnosis not present

## 2020-03-01 DIAGNOSIS — R3915 Urgency of urination: Secondary | ICD-10-CM | POA: Diagnosis not present

## 2020-03-01 DIAGNOSIS — N400 Enlarged prostate without lower urinary tract symptoms: Secondary | ICD-10-CM | POA: Diagnosis not present

## 2020-03-04 ENCOUNTER — Ambulatory Visit: Payer: Medicare HMO | Admitting: Psychology

## 2020-03-04 ENCOUNTER — Encounter: Payer: Self-pay | Admitting: Adult Health

## 2020-03-04 ENCOUNTER — Telehealth (INDEPENDENT_AMBULATORY_CARE_PROVIDER_SITE_OTHER): Payer: Medicare HMO | Admitting: Adult Health

## 2020-03-04 DIAGNOSIS — B349 Viral infection, unspecified: Secondary | ICD-10-CM | POA: Diagnosis not present

## 2020-03-04 DIAGNOSIS — H6980 Other specified disorders of Eustachian tube, unspecified ear: Secondary | ICD-10-CM | POA: Diagnosis not present

## 2020-03-04 DIAGNOSIS — K1379 Other lesions of oral mucosa: Secondary | ICD-10-CM | POA: Diagnosis not present

## 2020-03-04 MED ORDER — MAGIC MOUTHWASH W/LIDOCAINE: 5.0000 mL | Freq: Three times a day (TID) | ORAL | 0 refills | Status: DC | PRN

## 2020-03-04 NOTE — Progress Notes (Signed)
Virtual Visit via Telephone Note  I connected with Travis [name]. on 03/04/20 at 11:00 AM EDT by telephone and verified that I am speaking with the correct person using two identifiers.   I discussed the limitations, risks, security and privacy concerns of performing an evaluation and management service by telephone and the availability of in person appointments. I also discussed with the patient that there may be a patient responsible charge related to this service. The patient expressed understanding and agreed to proceed.  Location patient: home Location provider: work or home office Participants present for the call: patient, provider Patient did not have a visit in the prior 7 days to address this/these issue(s).   History of Present Illness: 69 year old male who is being evaluated today for an acute issue.  His symptoms started less than a week ago.  Symptoms include sore throat that is a constant ache and feels "sharp" when he swallows, mild earache and nasal congestion, as well as a lesion on the side of his tongue that is painful when he eats or drinks.  Denies fevers and chills or white patches on his throat.  Has been vaccinated against COVID-19.  He has been using Flonase, meloxicam and also drinking apple cider vinegar for the last few months.     Observations/Objective: Patient sounds cheerful and well on the phone. I do not appreciate any SOB. Speech and thought processing are grossly intact. Patient reported vitals:  Assessment and Plan: Symptoms are likely from viral illness.  We will send in Magic mouthwash with lidocaine that he can gargle and spit, continue with Flonase and Mobic, he can use Afrin nasal spray for eustachian tube dysfunction over the next 3 days. Also stop with apple cider vinegar for the next week to let his mouth heal. Follow up if no improvement in the next 2-3 days   Follow Up Instructions:  I did not refer this patient for an OV in the  next 24 hours for this/these issue(s).  I discussed the assessment and treatment plan with the patient. The patient was provided an opportunity to ask questions and all were answered. The patient agreed with the plan and demonstrated an understanding of the instructions.   The patient was advised to call back or seek an in-person evaluation if the symptoms worsen or if the condition fails to improve as anticipated.  I provided 15 minutes of non-face-to-face time during this encounter.   Dorothyann Peng, NP

## 2020-03-05 ENCOUNTER — Telehealth: Payer: Self-pay

## 2020-03-05 NOTE — Telephone Encounter (Signed)
Patient called in saying ears nose & throat doctor wants to do sinus surgery , doesn't want to get it if it will interfere with back surgery, want to know what should he do

## 2020-03-05 NOTE — Telephone Encounter (Signed)
Please advise 

## 2020-03-09 ENCOUNTER — Other Ambulatory Visit (INDEPENDENT_AMBULATORY_CARE_PROVIDER_SITE_OTHER): Payer: Self-pay | Admitting: Specialist

## 2020-03-09 ENCOUNTER — Other Ambulatory Visit: Payer: Self-pay | Admitting: Internal Medicine

## 2020-03-09 NOTE — Telephone Encounter (Signed)
Please refill as per office routine med refill policy (all routine meds refilled for 3 mo or monthly per pt preference up to one year from last visit, then month to month grace period for 3 mo, then further med refills will have to be denied)  

## 2020-03-17 ENCOUNTER — Encounter: Payer: Self-pay | Admitting: Physical Medicine and Rehabilitation

## 2020-03-17 ENCOUNTER — Ambulatory Visit: Payer: Self-pay

## 2020-03-17 ENCOUNTER — Ambulatory Visit (INDEPENDENT_AMBULATORY_CARE_PROVIDER_SITE_OTHER): Payer: Medicare HMO | Admitting: Psychology

## 2020-03-17 ENCOUNTER — Other Ambulatory Visit: Payer: Self-pay

## 2020-03-17 ENCOUNTER — Ambulatory Visit (INDEPENDENT_AMBULATORY_CARE_PROVIDER_SITE_OTHER): Payer: Medicare HMO | Admitting: Physical Medicine and Rehabilitation

## 2020-03-17 VITALS — BP 150/74 | HR 59

## 2020-03-17 DIAGNOSIS — M5416 Radiculopathy, lumbar region: Secondary | ICD-10-CM

## 2020-03-17 DIAGNOSIS — F332 Major depressive disorder, recurrent severe without psychotic features: Secondary | ICD-10-CM

## 2020-03-17 MED ORDER — METHYLPREDNISOLONE ACETATE 80 MG/ML IJ SUSP
80.0000 mg | Freq: Once | INTRAMUSCULAR | Status: AC
  Administered 2020-03-17: 80 mg

## 2020-03-17 NOTE — Progress Notes (Signed)
Pt state lower back pain. Pt state walking, standing, climbing and lifting makes the pain worse. Pt state he takes pain meds and use heating pads to helps ease the pain.  Numeric Pain Rating Scale and Functional Assessment Average Pain 8   In the last MONTH (on 0-10 scale) has pain interfered with the following?  1. General activity like being  able to carry out your everyday physical activities such as walking, climbing stairs, carrying groceries, or moving a chair?  Rating(10)   +Driver, -BT, -Dye Allergies.

## 2020-03-22 ENCOUNTER — Other Ambulatory Visit: Payer: Self-pay | Admitting: Internal Medicine

## 2020-03-22 DIAGNOSIS — N400 Enlarged prostate without lower urinary tract symptoms: Secondary | ICD-10-CM | POA: Diagnosis not present

## 2020-03-22 DIAGNOSIS — R3915 Urgency of urination: Secondary | ICD-10-CM | POA: Diagnosis not present

## 2020-03-22 DIAGNOSIS — N410 Acute prostatitis: Secondary | ICD-10-CM | POA: Diagnosis not present

## 2020-04-02 ENCOUNTER — Telehealth: Payer: Self-pay | Admitting: Physician Assistant

## 2020-04-02 DIAGNOSIS — M7918 Myalgia, other site: Secondary | ICD-10-CM | POA: Diagnosis not present

## 2020-04-02 DIAGNOSIS — M961 Postlaminectomy syndrome, not elsewhere classified: Secondary | ICD-10-CM | POA: Diagnosis not present

## 2020-04-02 DIAGNOSIS — M533 Sacrococcygeal disorders, not elsewhere classified: Secondary | ICD-10-CM | POA: Insufficient documentation

## 2020-04-02 DIAGNOSIS — M47816 Spondylosis without myelopathy or radiculopathy, lumbar region: Secondary | ICD-10-CM | POA: Diagnosis not present

## 2020-04-02 DIAGNOSIS — G894 Chronic pain syndrome: Secondary | ICD-10-CM | POA: Diagnosis not present

## 2020-04-02 NOTE — Telephone Encounter (Signed)
[name] received cardiac clearance for spinal cord stimulator during visit with cardiology 8/21.  Surgeons office is now requesting blood thinners be held 3 days postoperatively.  At the time of his August visit he was stable from a cardiac standpoint.  May his Effient and aspirin be held 3 days postoperatively as well?  Thank you for your help.  Please direct response to CV DIV preop pool.  Jossie Ng. Chirstina Haan NP-C    04/02/2020, 3:21 PM [place] [address] Office [phone] Fax [phone]

## 2020-04-02 NOTE — Telephone Encounter (Signed)
     Pt is calling to follow up with his clearance, phone note made on 01/02/2020. He said per Dr. Posey Pronto they only received clearance to hold blood thinner 7 days prior procedure. They also need to get clearance to hold blood thinner 3 days after procedure

## 2020-04-02 NOTE — Telephone Encounter (Signed)
I will send this call to our pre op team in regards to blood thinner. See clearance note from 12/2019.

## 2020-04-02 NOTE — Telephone Encounter (Signed)
OK to hold ASA and Effient before and after procedure. Lauree Chandler

## 2020-04-06 ENCOUNTER — Other Ambulatory Visit: Payer: Self-pay | Admitting: Internal Medicine

## 2020-04-12 ENCOUNTER — Other Ambulatory Visit: Payer: Self-pay | Admitting: Otolaryngology

## 2020-04-12 ENCOUNTER — Other Ambulatory Visit (INDEPENDENT_AMBULATORY_CARE_PROVIDER_SITE_OTHER): Payer: Self-pay | Admitting: Specialist

## 2020-04-14 DIAGNOSIS — N139 Obstructive and reflux uropathy, unspecified: Secondary | ICD-10-CM | POA: Diagnosis not present

## 2020-04-14 DIAGNOSIS — N401 Enlarged prostate with lower urinary tract symptoms: Secondary | ICD-10-CM | POA: Diagnosis not present

## 2020-04-14 DIAGNOSIS — N5201 Erectile dysfunction due to arterial insufficiency: Secondary | ICD-10-CM | POA: Diagnosis not present

## 2020-04-14 DIAGNOSIS — R3912 Poor urinary stream: Secondary | ICD-10-CM | POA: Diagnosis not present

## 2020-04-19 ENCOUNTER — Ambulatory Visit: Payer: Medicare HMO | Admitting: Specialist

## 2020-04-21 ENCOUNTER — Other Ambulatory Visit: Payer: Self-pay | Admitting: Internal Medicine

## 2020-04-21 DIAGNOSIS — R3915 Urgency of urination: Secondary | ICD-10-CM | POA: Diagnosis not present

## 2020-04-21 DIAGNOSIS — N410 Acute prostatitis: Secondary | ICD-10-CM | POA: Diagnosis not present

## 2020-04-21 DIAGNOSIS — N400 Enlarged prostate without lower urinary tract symptoms: Secondary | ICD-10-CM | POA: Diagnosis not present

## 2020-04-27 ENCOUNTER — Other Ambulatory Visit: Payer: Self-pay | Admitting: Physician Assistant

## 2020-04-27 DIAGNOSIS — E785 Hyperlipidemia, unspecified: Secondary | ICD-10-CM

## 2020-04-28 ENCOUNTER — Other Ambulatory Visit: Payer: Self-pay

## 2020-04-28 ENCOUNTER — Ambulatory Visit: Payer: Self-pay

## 2020-04-28 ENCOUNTER — Encounter: Payer: Self-pay | Admitting: Specialist

## 2020-04-28 ENCOUNTER — Ambulatory Visit (INDEPENDENT_AMBULATORY_CARE_PROVIDER_SITE_OTHER): Payer: Medicare HMO | Admitting: Specialist

## 2020-04-28 VITALS — BP 166/80 | HR 75 | Ht 73.0 in | Wt 325.0 lb

## 2020-04-28 DIAGNOSIS — M48062 Spinal stenosis, lumbar region with neurogenic claudication: Secondary | ICD-10-CM

## 2020-04-28 DIAGNOSIS — M2170 Unequal limb length (acquired), unspecified site: Secondary | ICD-10-CM | POA: Diagnosis not present

## 2020-04-28 DIAGNOSIS — M25561 Pain in right knee: Secondary | ICD-10-CM | POA: Diagnosis not present

## 2020-04-28 DIAGNOSIS — M5137 Other intervertebral disc degeneration, lumbosacral region: Secondary | ICD-10-CM | POA: Diagnosis not present

## 2020-04-28 DIAGNOSIS — G8929 Other chronic pain: Secondary | ICD-10-CM

## 2020-04-28 DIAGNOSIS — M5440 Lumbago with sciatica, unspecified side: Secondary | ICD-10-CM | POA: Diagnosis not present

## 2020-04-28 DIAGNOSIS — M4316 Spondylolisthesis, lumbar region: Secondary | ICD-10-CM | POA: Diagnosis not present

## 2020-04-28 DIAGNOSIS — M47816 Spondylosis without myelopathy or radiculopathy, lumbar region: Secondary | ICD-10-CM | POA: Diagnosis not present

## 2020-04-28 NOTE — Patient Instructions (Signed)
Plan: Avoid bending, stooping and avoid lifting weights greater than 10 lbs. Avoid prolong standing and walking. Order for a new walker with wheels. Surgery scheduling secretary Kandice Hams, will call you in the next week to schedule for surgery.  Surgery recommended is a five level lumbar fusion L1-2, L2-3, L3-4, L4-5 and L5-S1this would be done with rods, screws and cages with local bone graft and allograft (donor bone graft). Take hydrocodone for for pain. Risk of surgery includes risk of infection 1 in 300 patients, bleeding 20% chance you would need a transfusion.   Risk to the nerves is one in 10,000. You will need to use a brace for 3 months and wean from the brace on the 4th month. Expect improved walking and standing tolerance. Expect relief of leg pain but numbness may persist depending on the length and degree of pressure that has been present. Scoliosis radiographs to evaluate sagital alignment preop.

## 2020-04-28 NOTE — Progress Notes (Addendum)
Office Visit Note   Patient: Travis SANTISTEVAN Sr.           Date of Birth: October 20, 1950           MRN: [id number] Visit Date: 04/28/2020              Requested by: [name], MD [place],  [place] [id number] PCP: [name], MD   Assessment & Plan: Visit Diagnoses:  1. Disc disease, degenerative, lumbar or lumbosacral   2. Spinal stenosis of lumbar region with neurogenic claudication   3. Spondylosis without myelopathy or radiculopathy, lumbar region   4. Acquired leg length discrepancy   5. Acute right-sided low back pain with sciatica, sciatica laterality unspecified   6. Chronic pain of right knee   7. Spondylolisthesis, lumbar region     Plan: Avoid bending, stooping and avoid lifting weights greater than 10 lbs. Avoid prolong standing and walking. Order for a new walker with wheels. Surgery scheduling secretary Kandice Hams, will call you in the next week to schedule for surgery.  Surgery recommended is a five level lumbar fusion L1-2, L2-3, L3-4, L4-5 and L5-S1this would be done with rods, screws and cages with local bone graft and allograft (donor bone graft). Take hydrocodone for for pain. Risk of surgery includes risk of infection 1 in 300 patients, bleeding 20% chance you would need a transfusion.   Risk to the nerves is one in 10,000. You will need to use a brace for 3 months and wean from the brace on the 4th month. Expect improved walking and standing tolerance. Expect relief of leg pain but numbness may persist depending on the length and degree of pressure that has been present. Scoliosis radiographs to evaluate sagital alignment preop.     Follow-Up Instructions: Return in about 4 weeks (around 05/26/2020).   Orders:  Orders Placed This Encounter  Procedures  . XR SCOLIOSIS EVAL COMPLETE SPINE 2 OR 3 VIEWS   No orders of the defined types were placed in this encounter.     Procedures: No procedures performed   Clinical  Data: No additional findings.   Subjective: Chief Complaint  Patient presents with  . Lower Back - Follow-up    [age] year old male with pain into his lower back. Pain with standing and walking and lying down. Tried rubs, creams, ice and heat without relief. The pain meds hydrocodone do not help. He underwent a recent left L4 TF ESI with about 2 weeks relief on a level of about 60 % and he is scheduled to have the right SI joint injected by Dr. Posey Pronto at [facility]. He is having pain that is mainly back and severe. Feels like a kidney stone. He is leaning to the right side. The pain is limiting his ability to stand and walk. No bowel or bladder difficulty. His urologist has evaluated him for a kidney stone and this is negative. If surgery can be done he wants to go ahead. Leaning forward feels a little bit better. Left leg flexed helps relieve the pain decreases from a 9 to a 7.     Review of Systems  Constitutional: Negative.   HENT: Negative.   Eyes: Negative.   Respiratory: Negative.   Cardiovascular: Negative.   Gastrointestinal: Negative.   Endocrine: Negative.   Genitourinary: Negative.   Musculoskeletal: Negative.   Skin: Negative.   Allergic/Immunologic: Negative.   Neurological: Negative.   Hematological: Negative.   Psychiatric/Behavioral:  Negative.      Objective: Vital Signs: BP (!) 166/80 (BP Location: Left Arm, Patient Position: Sitting)   Pulse 75   Ht 6' 1"  (1.854 m)   Wt (!) 325 lb (147.4 kg)   BMI 42.88 kg/m   Physical Exam Constitutional:      Appearance: He is well-developed.  HENT:     Head: Normocephalic and atraumatic.  Eyes:     Pupils: Pupils are equal, round, and reactive to light.  Pulmonary:     Effort: Pulmonary effort is normal.     Breath sounds: Normal breath sounds.  Abdominal:     General: Bowel sounds are normal.     Palpations: Abdomen is soft.  Musculoskeletal:     Cervical back: Normal range of motion and neck  supple.  Skin:    General: Skin is warm and dry.  Neurological:     Mental Status: He is alert and oriented to person, place, and time.  Psychiatric:        Behavior: Behavior normal.        Thought Content: Thought content normal.        Judgment: Judgment normal.     Back Exam   Tenderness  The patient is experiencing tenderness in the lumbar.  Range of Motion  Extension: abnormal  Flexion: abnormal  Lateral bend right: abnormal  Lateral bend left: abnormal  Rotation right: abnormal  Rotation left: abnormal   Muscle Strength  Right Quadriceps:  5/5  Right Hamstrings:  5/5   Reflexes  Patellar: 0/4 Achilles: 0/4  Comments:  Walks with leaning to the right side with scoliosis irritating him.       Specialty Comments:  No specialty comments available.  Imaging: No results found.   PMFS History: Patient Active Problem List   Diagnosis Date Noted  . Other spondylosis with radiculopathy, lumbar region 07/16/2018    Priority: High    Class: Chronic  . Spinal stenosis in cervical region 09/26/2013    Priority: High    Class: Chronic  . Spinal stenosis of lumbar region 09/26/2013    Priority: High    Class: Chronic  . Vitamin D deficiency 02/24/2020  . Cough 07/29/2019  . Wheezing 07/29/2019  . Possible exposure to STD 07/24/2019  . Unilateral primary osteoarthritis, left hip 06/06/2019  . Status post total replacement of left hip 06/06/2019  . S/P laparoscopic sleeve gastrectomy 03/03/2019  . Rash 01/24/2019  . Fever 08/15/2018  . UTI (urinary tract infection) 08/15/2018  . Fall at home, initial encounter 08/15/2018  . Chronic anemia 08/15/2018  . Hypokalemia 08/15/2018  . Bowel incontinence 07/25/2018  . Status post lumbar laminectomy 07/16/2018  . Status post THR (total hip replacement) 04/17/2018  . Unilateral primary osteoarthritis, right hip   . Morbid obesity with BMI of 40.0-44.9, adult ([place]) 02/28/2018  . Myocardial infarction ([place])  02/25/2018  . Coronary artery disease involving native coronary artery of native heart 02/25/2018  . PAD (peripheral artery disease) ([place]) 02/25/2018  . S/P BKA (below knee amputation) unilateral, right ([place]) 02/25/2018  . Sacroiliitis ([place]) 02/25/2018  . SOB (shortness of breath) 01/03/2018  . Acute on chronic heart failure ([place]) 01/03/2018  . Severe right groin pain 12/20/2017  . Morbid obesity ([place]) 10/09/2017  . Low back pain 07/13/2017  . Leukoplakia, tongue 01/26/2017  . Skin lesion 10/20/2016  . S/P unilateral BKA (below knee amputation), right ([place]) 06/14/2016  . Charcot foot due to diabetes mellitus ([place])   . Charcot's arthropathy  associated with type 2 diabetes mellitus ([place]) 04/11/2016  . Preventative health care 11/05/2015  . Major depression 09/13/2015  . S/P TKR (total knee replacement) bilaterally 09/13/2015  . GERD (gastroesophageal reflux disease) 09/08/2015  . S/P laparoscopic hernia repair 05/11/2015  . PPD positive 04/08/2015  . Benign neoplasm of descending colon   . Benign neoplasm of cecum   . Acute blood loss anemia   . Chronic anticoagulation   . Occult blood positive stool 10/17/2014  . General weakness 07/14/2014  . Urinary incontinence 07/14/2014  . Headache(784.0) 10/15/2013  . Hand joint pain 06/10/2013  . Rotator cuff tear arthropathy of both shoulders 06/10/2013  . Skin lesion of cheek 05/01/2013  . Pain of right thumb 04/03/2013  . Balance disorder 03/12/2013  . Gait disorder 03/12/2013  . Tremor 03/12/2013  . Left hip pain 03/12/2013  . Pre-ulcerative corn or callous 02/06/2013  . Anxiety 11/12/2011  . OSA on CPAP 11/07/2011  . Bradycardia 10/20/2011  . Insomnia 10/04/2011  . Obesity 01/12/2011  . Diabetes mellitus type 2 in obese ([place]) 09/27/2010  . ERECTILE DYSFUNCTION, ORGANIC 05/30/2010  . [place] DISEASE, LUMBAR 04/19/2010  . SCIATICA, LEFT 04/19/2010  . Chronic pain syndrome 10/27/2009  . Hyperlipidemia 07/15/2009  . Essential  hypertension 06/24/2009  . Coronary artery disease involving native coronary artery of native heart without angina pectoris 06/24/2009  . Allergic rhinitis 06/24/2009  . URETHRAL STRICTURE 06/24/2009  . DEGENERATIVE JOINT DISEASE 06/24/2009  . SHOULDER PAIN, BILATERAL 06/24/2009  . FATIGUE 06/24/2009  . NEPHROLITHIASIS, HX OF 06/24/2009   Past Medical History:  Diagnosis Date  . ALLERGIC RHINITIS 06/24/2009  . Allergy   . Anemia    IDA  . Anxiety 11/12/2011   Adequate for discharge   . Arthritis    "all my joints" (09/30/2013)  . Arthritis of foot, right, degenerative 04/15/2014  . Balance disorder 03/12/2013  . Benign neoplasm of cecum   . Benign neoplasm of descending colon   . CAD (coronary artery disease) 06/24/2009   5 stents placed in 2007    . Chronic anticoagulation   . Chronic pain syndrome 10/27/2009   of ankle, shoulders, low back.  sciatica.   . Closed fracture of right foot 10/17/2014  . CORONARY ARTERY DISEASE 06/24/2009   a. s/p multiple PCIs - In 2008 he had a Taxus DES to the mild LAD, Endeavor DES to mid LCX and distal LCX. In January 2009 he had DES to distal LCX, mid LCX and proximal LCX. In November 2009 had BMS x 2 to the mid RCA. Cath 10/2011 with patent stents, noncardiac CP. LHC 01/2013: patent stents (noncardiac CP).  . DEGENERATIVE JOINT DISEASE 06/24/2009   Qualifier: Diagnosis of  By: [name], [name]   . Depression   . Depression with anxiety    Prior suicide attempt(08/25/19-pt states not suicide attempt)  . [place] DISEASE, LUMBAR 04/19/2010  . ERECTILE DYSFUNCTION, ORGANIC 05/30/2010  . Essential hypertension 06/24/2009   Qualifier: Diagnosis of  By: [name], [name]   . Fibromyalgia   . Fracture dislocation of ankle joint 09/02/2015  . Gait disorder 03/12/2013  . General weakness 07/14/2014  . GERD (gastroesophageal reflux disease) 09/08/2015   08/25/15-pt states was cardiac origin, not GERD  . Hand joint pain 06/10/2013  . Heart murmur   . Hepatitis C    treated  pt. unknown with what he was a teenager  . History of kidney stones   . Hyperlipidemia 07/15/2009   Qualifier: Diagnosis of  ByAundra Dubin, MD, [place]    . HYPERLIPIDEMIA-MIXED 07/15/2009   08/25/19-pt state cholesterol was normal range  . HYPERTENSION 06/24/2009  . Insomnia 10/04/2011  . Irregular heart beat   . Left hip pain 03/12/2013   Injected under ultrasound guidance on June 24, 2013   . Major depression 09/13/2015  . Myocardial infarction ([place]) Aug 07, 2006  . Non-cardiac chest pain 10/2011, 01/2013  . Obesity   . Occult blood positive stool 10/17/2014  . Open ankle fracture 09/02/2015  . OSA (obstructive sleep apnea)    not using CPAP (09/30/2013)  . Pain of right thumb 04/03/2013  . Pneumonia   . PPD positive 04/08/2015  . Pre-ulcerative corn or callous 02/06/2013  . Rotator cuff tear arthropathy of both shoulders 06/10/2013   History of bilateral shoulder cuff surgery for rotator cuff tears. Reports increase in pain 09/11/2015 during physical therapy of the left shoulder.   [name] SCIATICA, LEFT 04/19/2010   Qualifier: Diagnosis of  By: [name], [name]   . Sleep apnea    wears cpap 08/25/19-States not using due to nasal stuffiness.  [name] Spinal stenosis in cervical region 09/26/2013  . Spinal stenosis, lumbar region, with neurogenic claudication 09/26/2013  . Type II diabetes mellitus ([place]) 07-Aug-2010   no meds in 09/2014.   [name] Uncontrolled type 2 DM with peripheral circulatory disorder ([place]) 10/04/2013   08/25/19- pt states A1C normal, no diabetes  . URETHRAL STRICTURE 06/24/2009   self catheterizes.     Family History  Problem Relation Age of Onset  . Depression Mother   . Heart disease Mother   . Hypertension Mother   . Breast cancer Mother        primary cancer  . Stomach cancer Mother   . Esophageal cancer Mother   . Pancreatic cancer Mother   . Diabetes Father   . Heart disease Father        CABG  . Hypertension Father   . Hyperlipidemia Father   . Prostate cancer Father   . Skin cancer Father    . Depression Brother        x 2  . Hypertension Brother        x2  . Colon polyps Brother   . Heart attack Son 29  . Early death Son   . Heart disease Maternal Grandfather   . Early death Maternal Grandfather   . Heart attack Maternal Grandfather August 08, 2063  . Early death Paternal Grandfather   . Coronary artery disease Other   . Hypertension Other   . Depression Other   . Healthy Son   . Colon cancer Neg Hx   . Rectal cancer Neg Hx     Past Surgical History:  Procedure Laterality Date  . AMPUTATION Right 06/14/2016   Procedure: AMPUTATION BELOW KNEE;  Surgeon: Newt Minion, MD;  Location: [facility];  Service: Orthopedics;  Laterality: Right;  . ANKLE FUSION Right 04/15/2014   Procedure: Right Subtalar, Talonavicular Fusion;  Surgeon: Newt Minion, MD;  Location: [facility];  Service: Orthopedics;  Laterality: Right;  . ANKLE FUSION Right 04/18/2016   Procedure: Right Ankle Tibiocalcaneal Fusion;  Surgeon: Newt Minion, MD;  Location: [facility];  Service: Orthopedics;  Laterality: Right;  . ANTERIOR CERVICAL DECOMP/DISCECTOMY FUSION N/A 09/26/2013   Procedure: ANTERIOR CERVICAL DISCECTOMY FUSION C3-4, plate and screw fixation, allograft bone graft;  Surgeon: Jessy Oto, MD;  Location: [facility];  Service: Orthopedics;  Laterality: N/A;  . BACK SURGERY  3  . BELOW KNEE LEG AMPUTATION Right 06/14/2016   right ankle and foot  . CARDIAC CATHETERIZATION  X 1  . CARPAL TUNNEL RELEASE Bilateral   . COLONOSCOPY N/A 10/22/2014   Procedure: COLONOSCOPY;  Surgeon: Lafayette Dragon, MD;  Location: [facility] ENDOSCOPY;  Service: Endoscopy;  Laterality: N/A;  . COLONOSCOPY  11/19/2018  . CORONARY ANGIOPLASTY WITH STENT PLACEMENT     "I have 9 stents"  . ESOPHAGOGASTRODUODENOSCOPY N/A 10/19/2014   Procedure: ESOPHAGOGASTRODUODENOSCOPY (EGD);  Surgeon: Jerene Bears, MD;  Location: [facility] ENDOSCOPY;  Service: Endoscopy;  Laterality: N/A;  . FRACTURE SURGERY    . FUSION OF TALONAVICULAR JOINT Right 04/15/2014   dr duda  .  GASTRIC BYPASS  02/20/2019  . HERNIA REPAIR     umbilical  . INGUINAL HERNIA REPAIR Right 05/11/2015   Procedure: LAPAROSCOPIC REPAIR RIGHT  INGUINAL HERNIA;  Surgeon: Greer Pickerel, MD;  Location: [facility];  Service: General;  Laterality: Right;  . INSERTION OF MESH Right 05/11/2015   Procedure: INSERTION OF MESH;  Surgeon: Greer Pickerel, MD;  Location: [facility];  Service: General;  Laterality: Right;  . JOINT REPLACEMENT    . KNEE CARTILAGE SURGERY Right X 12   "~ 1/2 open; ~ 1/2 scopes"  . KNEE CARTILAGE SURGERY Left X 3   "3 scopes"  . LAPAROSCOPIC GASTRIC SLEEVE RESECTION N/A 03/03/2019   Procedure: LAPAROSCOPIC GASTRIC SLEEVE RESECTION, Upper Endo, ERAS Pathway;  Surgeon: Greer Pickerel, MD;  Location: [facility];  Service: General;  Laterality: N/A;  . LEFT HEART CATHETERIZATION WITH CORONARY ANGIOGRAM N/A 02/10/2013   Procedure: LEFT HEART CATHETERIZATION WITH CORONARY ANGIOGRAM;  Surgeon: Burnell Blanks, MD;  Location[facility] CATH LAB;  Service: Cardiovascular;  Laterality: N/A;  . LUMBAR LAMINECTOMY N/A 07/16/2018   Procedure: LEFT L4-5 REDO PARTIAL LUMBAR HEMILAMINECTOMY WITH FORAMINOTOMY LEFT L4;  Surgeon: Jessy Oto, MD;  Location: [facility];  Service: Orthopedics;  Laterality: N/A;  . LUMBAR LAMINECTOMY/DECOMPRESSION MICRODISCECTOMY N/A 01/27/2014   Procedure: CENTRAL LUMBAR LAMINECTOMY L4-5 AND L3-4;  Surgeon: Jessy Oto, MD;  Location: [facility];  Service: Orthopedics;  Laterality: N/A;  . ORIF ANKLE FRACTURE Right 09/02/2015   Procedure: OPEN REDUCTION INTERNAL FIXATION (ORIF) ANKLE FRACTURE;  Surgeon: Newt Minion, MD;  Location: [facility];  Service: Orthopedics;  Laterality: Right;  . PERIPHERALLY INSERTED CENTRAL CATHETER INSERTION  09/02/2015  . POLYPECTOMY    . ROTATOR CUFF REPAIR Left    x 2  . ROTATOR CUFF REPAIR Right    x 3  . SHOULDER ARTHROSCOPY W/ ROTATOR CUFF REPAIR Bilateral    "3 on the right; 1 on the left"  . SKIN SPLIT GRAFT Right 10/01/2015   Procedure: RIGHT ANKLE APPLY  SKIN GRAFT SPLIT THICKNESS;  Surgeon: Newt Minion, MD;  Location: [facility];  Service: Orthopedics;  Laterality: Right;  . TONSILLECTOMY    . TOOTH EXTRACTION    . TOTAL HIP ARTHROPLASTY Right 04/17/2018  . TOTAL HIP ARTHROPLASTY Right 04/17/2018   Procedure: RIGHT TOTAL HIP ARTHROPLASTY;  Surgeon: Newt Minion, MD;  Location: [facility];  Service: Orthopedics;  Laterality: Right;  . TOTAL HIP ARTHROPLASTY Left 06/06/2019   Procedure: LEFT TOTAL HIP ARTHROPLASTY ANTERIOR APPROACH;  Surgeon: Mcarthur Rossetti, MD;  Location: [facility];  Service: Orthopedics;  Laterality: Left;  . TOTAL HIP REVISION Left 05/24/2019  . TOTAL KNEE ARTHROPLASTY Bilateral 2008  . UMBILICAL HERNIA REPAIR     UHR  . UPPER GASTROINTESTINAL ENDOSCOPY  2016  .  URETHRAL DILATION  X 4  . VASECTOMY    . WISDOM TOOTH EXTRACTION     Social History   Occupational History  . Occupation: disabled since 2006 due to ortho. heart, psych    Employer: UNEMPLOYED  . Occupation: part time work as an Multimedia programmer, wrestling, and Holiday representative  Tobacco Use  . Smoking status: Former Smoker    Types: Cigars    Quit date: 08/28/2010    Years since quitting: 9.7  . Smokeless tobacco: Never Used  . Tobacco comment: 04/18/2016 "smoked 1 cigar/wk when I did smoke"  Vaping Use  . Vaping Use: Never used  Substance and Sexual Activity  . Alcohol use: Not Currently    Alcohol/week: 0.0 standard drinks    Comment: rarely  . Drug use: No  . Sexual activity: Not Currently

## 2020-04-29 ENCOUNTER — Other Ambulatory Visit: Payer: Self-pay | Admitting: Cardiovascular Disease

## 2020-04-29 DIAGNOSIS — G8929 Other chronic pain: Secondary | ICD-10-CM | POA: Diagnosis not present

## 2020-04-29 DIAGNOSIS — M533 Sacrococcygeal disorders, not elsewhere classified: Secondary | ICD-10-CM | POA: Diagnosis not present

## 2020-04-29 DIAGNOSIS — M7918 Myalgia, other site: Secondary | ICD-10-CM | POA: Diagnosis not present

## 2020-04-30 ENCOUNTER — Telehealth: Payer: Self-pay

## 2020-04-30 NOTE — Telephone Encounter (Signed)
Patient called about scheduling surgery.  Need surgery sheet.  Thanks!

## 2020-05-03 DIAGNOSIS — Z79899 Other long term (current) drug therapy: Secondary | ICD-10-CM | POA: Diagnosis not present

## 2020-05-03 DIAGNOSIS — G894 Chronic pain syndrome: Secondary | ICD-10-CM | POA: Diagnosis not present

## 2020-05-03 DIAGNOSIS — M961 Postlaminectomy syndrome, not elsewhere classified: Secondary | ICD-10-CM | POA: Diagnosis not present

## 2020-05-03 DIAGNOSIS — M47816 Spondylosis without myelopathy or radiculopathy, lumbar region: Secondary | ICD-10-CM | POA: Diagnosis not present

## 2020-05-03 DIAGNOSIS — M7918 Myalgia, other site: Secondary | ICD-10-CM | POA: Diagnosis not present

## 2020-05-03 NOTE — Telephone Encounter (Signed)
Blue sheet started for him

## 2020-05-04 ENCOUNTER — Other Ambulatory Visit: Payer: Self-pay | Admitting: Internal Medicine

## 2020-05-04 ENCOUNTER — Other Ambulatory Visit (HOSPITAL_COMMUNITY): Payer: Medicare HMO

## 2020-05-04 ENCOUNTER — Telehealth: Payer: Self-pay | Admitting: Internal Medicine

## 2020-05-04 NOTE — Progress Notes (Signed)
Adhrit Krenz. - 69 y.o. male MRN [id number]  Date of birth: [birth year]  Office Visit Note: Visit Date: 03/17/2020 PCP: [name], MD Referred by: [name], MD  Subjective: Chief Complaint  Patient presents with  . Lower Back - Pain   HPI:  Orris Perin. is a 69 y.o. male who comes in today at the request of Dr. [name] for planned Left L4-L5 Lumbar epidural steroid injection with fluoroscopic guidance.  The patient has failed conservative care including home exercise, medications, time and activity modification.  This injection will be diagnostic and hopefully therapeutic.  Please see requesting physician notes for further details and justification.   ROS Otherwise per HPI.  Assessment & Plan: Visit Diagnoses:    ICD-10-CM   1. Lumbar radiculopathy  M54.16 XR C-ARM NO REPORT    Epidural Steroid injection    methylPREDNISolone acetate (DEPO-MEDROL) injection 80 mg    Plan: No additional findings.   Meds & Orders:  Meds ordered this encounter  Medications  . methylPREDNISolone acetate (DEPO-MEDROL) injection 80 mg    Orders Placed This Encounter  Procedures  . XR C-ARM NO REPORT  . Epidural Steroid injection    Follow-up: Return for visit to requesting physician as needed.   Procedures: No procedures performed  Lumbosacral Transforaminal Epidural Steroid Injection - Sub-Pedicular Approach with Fluoroscopic Guidance  Patient: BENI TURRELL Sr.      Date of Birth: 1950-07-21 MRN: [id number] PCP: [name], MD      Visit Date: 03/17/2020   Universal Protocol:    Date/Time: 03/17/2020  Consent Given By: the patient  Position: PRONE  Additional Comments: Vital signs were monitored before and after the procedure. Patient was prepped and draped in the usual sterile fashion. The correct patient, procedure, and site was verified.   Injection Procedure Details:   Procedure diagnoses: Lumbar radiculopathy [M54.16]    Meds  Administered:  Meds ordered this encounter  Medications  . methylPREDNISolone acetate (DEPO-MEDROL) injection 80 mg    Laterality: Left  Location/Site:  L4-L5  Needle:5.0 in., 22 ga.  Short bevel or Quincke spinal needle  Needle Placement: Transforaminal  Findings:    -Comments: Excellent flow of contrast along the nerve, nerve root and into the epidural space.  Procedure Details: After squaring off the end-plates to get a true AP view, the C-arm was positioned so that an oblique view of the foramen as noted above was visualized. The target area is just inferior to the "nose of the scotty dog" or sub pedicular. The soft tissues overlying this structure were infiltrated with 2-3 ml. of 1% Lidocaine without Epinephrine.  The spinal needle was inserted toward the target using a "trajectory" view along the fluoroscope beam.  Under AP and lateral visualization, the needle was advanced so it did not puncture dura and was located close the 6 O'Clock position of the pedical in AP tracterory. Biplanar projections were used to confirm position. Aspiration was confirmed to be negative for CSF and/or blood. A 1-2 ml. volume of Isovue-250 was injected and flow of contrast was noted at each level. Radiographs were obtained for documentation purposes.   After attaining the desired flow of contrast documented above, a 0.5 to 1.0 ml test dose of 0.25% Marcaine was injected into each respective transforaminal space.  The patient was observed for 90 seconds post injection.  After no sensory deficits were reported, and normal lower extremity motor function was noted,   the above  injectate was administered so that equal amounts of the injectate were placed at each foramen (level) into the transforaminal epidural space.   Additional Comments:  The patient tolerated the procedure well Dressing: 2 x 2 sterile gauze and Band-Aid    Post-procedure details: Patient was observed during the  procedure. Post-procedure instructions were reviewed.  Patient left the clinic in stable condition.      Clinical History: MRI LUMBAR SPINE WITHOUT AND WITH CONTRAST  TECHNIQUE: Multiplanar and multiecho pulse sequences of the lumbar spine were obtained without and with intravenous contrast.  CONTRAST:  96m MULTIHANCE GADOBENATE DIMEGLUMINE 529 MG/ML IV SOLN  COMPARISON:  MRI of the lumbar spine September 05, 2017.  FINDINGS: Segmentation:  Standard.  Alignment: Minimal retrolisthesis of L2 over L3. Dextroconvex scoliosis of the lumbar spine.  Vertebrae: No fracture, evidence of discitis, or bone lesion. Postsurgical changes from L4-5 laminectomy.  Conus medullaris and cauda equina: Conus extends to the L1 level. Conus and cauda equina appear normal.  Paraspinal and other soft tissues: Negative.  Disc levels:  T12-L1: No spinal canal or neural foraminal stenosis.  L1-2: Loss disc height, posterior disc protrusion, facet degenerative changes and ligamentum flavum redundancy resulting in mild spinal canal stenosis with narrowing the bilateral subarticular zones and mild right neural foraminal narrowing.  L2-3: Loss of disc height, endplate spurring, facet degenerative changes and ligamentum flavum redundancy resulting in narrowing of the left subarticular zone and mild left neural foraminal narrowing.  L3-4: Hypertrophic facet degenerative changes and ligamentum flavum redundancy without significant spinal canal or neural foraminal stenosis.  L4-5: Loss of disc height, disc bulge with associated osteophytic component and facet degenerative changes, resulting in moderate bilateral neural foraminal narrowing. No spinal canal stenosis. T1 hypointense tissue with contrast enhancement surrounding the thecal sac and the descending bilateral L5 nerve roots consistent with postsurgical fibrosis.  L5-S1: Loss of disc height, endplate spurring and facet  degenerative changes. No significant spinal canal or neural foraminal stenosis.  No significant change from prior MRI.  IMPRESSION: 1. Postsurgical changes from L4-5 laminectomy with T1 hypointense tissue with contrast enhancement surrounding the thecal sac and the descending bilateral L5 nerve roots consistent with postsurgical fibrosis. 2. Stable multilevel degenerative changes of the lumbar spine as described above, with prominent facet arthrosis, more pronounced at L3-4 and L4-5. 3. Moderate bilateral neural foraminal narrowing at L4-5.   Electronically Signed   By: KPedro EarlsM.D.   On: 02/19/2020 16:33     Objective:  VS:  HT:    WT:   BMI:     BP:(!) 150/74  HR:(!) 59bpm  TEMP: ( )  RESP:  Physical Exam Constitutional:      General: He is not in acute distress.    Appearance: Normal appearance. He is not ill-appearing.  HENT:     Head: Normocephalic and atraumatic.     Right Ear: External ear normal.     Left Ear: External ear normal.  Eyes:     Extraocular Movements: Extraocular movements intact.  Cardiovascular:     Rate and Rhythm: Normal rate.     Pulses: Normal pulses.  Abdominal:     General: There is no distension.     Palpations: Abdomen is soft.  Musculoskeletal:        General: No tenderness or signs of injury.     Left lower leg: No edema.     Comments: Patient has good distal strength without clonus.  Right BKA  Skin:  Findings: No erythema or rash.  Neurological:     General: No focal deficit present.     Mental Status: He is alert and oriented to person, place, and time.     Sensory: No sensory deficit.     Motor: No weakness or abnormal muscle tone.     Coordination: Coordination normal.  Psychiatric:        Mood and Affect: Mood normal.        Behavior: Behavior normal.      Imaging: No results found.

## 2020-05-04 NOTE — Telephone Encounter (Signed)
Sorry we need more than a fax number for the paper work, so if he even had the name of the medical group that would be helpful

## 2020-05-04 NOTE — Procedures (Signed)
Lumbosacral Transforaminal Epidural Steroid Injection - Sub-Pedicular Approach with Fluoroscopic Guidance  Patient: Travis ELWOOD Sr.      Date of Birth: [birth year] MRN: [id number] PCP: [name], MD      Visit Date: 03/17/2020   Universal Protocol:    Date/Time: 03/17/2020  Consent Given By: the patient  Position: PRONE  Additional Comments: Vital signs were monitored before and after the procedure. Patient was prepped and draped in the usual sterile fashion. The correct patient, procedure, and site was verified.   Injection Procedure Details:   Procedure diagnoses: Lumbar radiculopathy [M54.16]    Meds Administered:  Meds ordered this encounter  Medications  . methylPREDNISolone acetate (DEPO-MEDROL) injection 80 mg    Laterality: Left  Location/Site:  L4-L5  Needle:5.0 in., 22 ga.  Short bevel or Quincke spinal needle  Needle Placement: Transforaminal  Findings:    -Comments: Excellent flow of contrast along the nerve, nerve root and into the epidural space.  Procedure Details: After squaring off the end-plates to get a true AP view, the C-arm was positioned so that an oblique view of the foramen as noted above was visualized. The target area is just inferior to the "nose of the scotty dog" or sub pedicular. The soft tissues overlying this structure were infiltrated with 2-3 ml. of 1% Lidocaine without Epinephrine.  The spinal needle was inserted toward the target using a "trajectory" view along the fluoroscope beam.  Under AP and lateral visualization, the needle was advanced so it did not puncture dura and was located close the 6 O'Clock position of the pedical in AP tracterory. Biplanar projections were used to confirm position. Aspiration was confirmed to be negative for CSF and/or blood. A 1-2 ml. volume of Isovue-250 was injected and flow of contrast was noted at each level. Radiographs were obtained for documentation purposes.   After attaining the  desired flow of contrast documented above, a 0.5 to 1.0 ml test dose of 0.25% Marcaine was injected into each respective transforaminal space.  The patient was observed for 90 seconds post injection.  After no sensory deficits were reported, and normal lower extremity motor function was noted,   the above injectate was administered so that equal amounts of the injectate were placed at each foramen (level) into the transforaminal epidural space.   Additional Comments:  The patient tolerated the procedure well Dressing: 2 x 2 sterile gauze and Band-Aid    Post-procedure details: Patient was observed during the procedure. Post-procedure instructions were reviewed.  Patient left the clinic in stable condition.

## 2020-05-04 NOTE — Telephone Encounter (Signed)
Patient called and said he was moving to [place] on 05/27/2020 and was wondering if Dr. Jenny Reichmann would send a referral to the orthopedic doctor that he would have there. He said he doesn't know the name but the Fax number is [phone].

## 2020-05-04 NOTE — Telephone Encounter (Signed)
Sent to Dr. John. 

## 2020-05-05 ENCOUNTER — Ambulatory Visit: Admit: 2020-05-05 | Payer: Medicare HMO | Admitting: Otolaryngology

## 2020-05-05 SURGERY — SINUS SURGERY, ENDOSCOPIC, USING COMPUTER-ASSISTED NAVIGATION
Anesthesia: General | Laterality: Bilateral

## 2020-05-06 NOTE — Telephone Encounter (Signed)
Spoke with pt and he has informed me that his message was a mistake and was wanting to know about how his med records will be sent once he has established care with a PCP in [place].

## 2020-05-13 ENCOUNTER — Telehealth: Payer: Self-pay

## 2020-05-13 NOTE — Telephone Encounter (Signed)
I will ask Dr. Louanne Skye about this on Monday

## 2020-05-13 NOTE — Telephone Encounter (Signed)
Patient called again about surgery scheduling.  Still awaiting order.  He did want to tell Dr. Louanne Skye that SI joint injection did help but has not cured him.

## 2020-05-17 ENCOUNTER — Telehealth: Payer: Self-pay | Admitting: Radiology

## 2020-05-17 ENCOUNTER — Other Ambulatory Visit (HOSPITAL_COMMUNITY): Payer: Self-pay | Admitting: Psychiatry

## 2020-05-17 ENCOUNTER — Other Ambulatory Visit: Payer: Self-pay | Admitting: Internal Medicine

## 2020-05-17 DIAGNOSIS — F411 Generalized anxiety disorder: Secondary | ICD-10-CM

## 2020-05-17 DIAGNOSIS — F319 Bipolar disorder, unspecified: Secondary | ICD-10-CM

## 2020-05-17 NOTE — Telephone Encounter (Signed)
Please refill as per office routine med refill policy (all routine meds refilled for 3 mo or monthly per pt preference up to one year from last visit, then month to month grace period for 3 mo, then further med refills will have to be denied)  

## 2020-05-17 NOTE — Telephone Encounter (Signed)
Would like his surgery scheduled as soon as possible.

## 2020-05-18 ENCOUNTER — Other Ambulatory Visit: Payer: Self-pay

## 2020-05-18 ENCOUNTER — Telehealth (INDEPENDENT_AMBULATORY_CARE_PROVIDER_SITE_OTHER): Payer: Medicare HMO | Admitting: Psychiatry

## 2020-05-18 ENCOUNTER — Encounter (HOSPITAL_COMMUNITY): Payer: Self-pay | Admitting: Psychiatry

## 2020-05-18 VITALS — Wt 325.0 lb

## 2020-05-18 DIAGNOSIS — F319 Bipolar disorder, unspecified: Secondary | ICD-10-CM | POA: Diagnosis not present

## 2020-05-18 DIAGNOSIS — F4321 Adjustment disorder with depressed mood: Secondary | ICD-10-CM | POA: Diagnosis not present

## 2020-05-18 DIAGNOSIS — F419 Anxiety disorder, unspecified: Secondary | ICD-10-CM

## 2020-05-18 MED ORDER — ARIPIPRAZOLE 10 MG PO TABS: 10.0000 mg | ORAL_TABLET | Freq: Every day | ORAL | 0 refills | Status: DC

## 2020-05-18 MED ORDER — DULOXETINE HCL 30 MG PO CPEP
30.0000 mg | ORAL_CAPSULE | Freq: Two times a day (BID) | ORAL | 0 refills | Status: DC
Start: 2020-05-18 — End: 2020-07-31

## 2020-05-18 MED ORDER — TRAZODONE HCL 150 MG PO TABS: 150.0000 mg | ORAL_TABLET | Freq: Every day | ORAL | 0 refills | Status: DC

## 2020-05-18 MED ORDER — BENZTROPINE MESYLATE 0.5 MG PO TABS: 0.5000 mg | ORAL_TABLET | Freq: Every day | ORAL | 0 refills | Status: DC

## 2020-05-18 NOTE — Progress Notes (Signed)
Virtual Visit via Telephone Note  I connected with Travis DUBIE Sr. on 05/18/20 at  1:00 PM EST by telephone and verified that I am speaking with the correct person using two identifiers.  Location: Patient: home Provider: home office   I discussed the limitations, risks, security and privacy concerns of performing an evaluation and management service by telephone and the availability of in person appointments. I also discussed with the patient that there may be a patient responsible charge related to this service. The patient expressed understanding and agreed to proceed.   History of Present Illness: Patient is evaluated by phone session.  He endorsed lately more irritability, mood swings and anger.  He admitted short temper and impulsive.  He again weight because he is impulsive eating.  Now he has strict diet regimen and hoping that works.  He admitted not able to walk as much due to back pain.  He is getting injection from pain management and now hoping to have back surgery in first week of January if insurance approved.  His PCP recently started him on Ambien when he complained about sleep but he also endorsed that despite taking Ambien he did not see a huge difference.  He like to go back on Abilify 10 mg which she used to take it in the past.  He grief about his son who died 6 months ago and he feels very sad and irritated on Christmas because this was the first Christmas without his son.  He admitted being normal isolated, sad, angry but denies any hallucination, paranoia or any suicidal thoughts.  He is hoping to move first week of January to extended stay hotel by himself.  He does not drive but he has travel vouchers through his insurance.  His nephew helps him when he need groceries.  His new place will be very close to her grocery store and he does not feel he need travel vouchers.  Denies any paranoia, suicidal thoughts.  He recently had blood work.  His hemoglobin A1c is normal.   His creatinine is 1.17.  He admitted noncompliant with Dr. Cheryln Manly which was helping his coping and grief.  He has no tremors, shakes or any EPS.   Past Psychiatric History: Sawpsychiatrist in [place] and [place]. H/Omood swings,suicidal gesture,anger,hallucination, irritabilityandincrease spending.Married 5 times. H/Oinpatient in 2014 due to overdose on Xanaxandalcohol.DidIOP. TookEffexor, Xanax, Klonopin, Ambien,Lunesta, Wellbutrin,Ambien,Remeronand Prozac.Abilify started in ouroffice.  Recent Results (from the past 2160 hour(s))  Comprehensive metabolic panel     Status: Abnormal   Collection Time: 02/24/20  4:13 PM  Result Value Ref Range   Sodium 137 135 - 145 mEq/L   Potassium 4.4 3.5 - 5.1 mEq/L   Chloride 103 96 - 112 mEq/L   CO2 26 19 - 32 mEq/L   Glucose, Bld 106 (H) 70 - 99 mg/dL   BUN 27 (H) 6 - 23 mg/dL   Creatinine, Ser 1.17 0.40 - 1.50 mg/dL   Total Bilirubin 0.4 0.2 - 1.2 mg/dL   Alkaline Phosphatase 57 39 - 117 U/L   AST 15 0 - 37 U/L   ALT 15 0 - 53 U/L   Total Protein 6.9 6.0 - 8.3 g/dL   Albumin 4.3 3.5 - 5.2 g/dL   GFR 63.80 >60.00 mL/min   Calcium 8.9 8.4 - 10.5 mg/dL  VITAMIN D 25 Hydroxy (Vit-D Deficiency, Fractures)     Status: Abnormal   Collection Time: 02/24/20  4:13 PM  Result Value Ref Range   VITD 29.84 (  L) 30.00 - 100.00 ng/mL  Urinalysis, Routine w reflex microscopic     Status: None   Collection Time: 02/24/20  4:13 PM  Result Value Ref Range   Color, Urine YELLOW Yellow;Lt. Yellow;Straw;Dark Yellow;Amber;Green;Red;Brown   APPearance CLEAR Clear;Turbid;Slightly Cloudy;Cloudy   Specific Gravity, Urine 1.025 1.000 - 1.030   pH 6.0 5.0 - 8.0   Total Protein, Urine NEGATIVE Negative   Urine Glucose NEGATIVE Negative   Ketones, ur NEGATIVE Negative   Bilirubin Urine NEGATIVE Negative   Hgb urine dipstick NEGATIVE Negative   Urobilinogen, UA 0.2 0.0 - 1.0   Leukocytes,Ua NEGATIVE Negative   Nitrite NEGATIVE Negative   WBC,  UA 0-2/hpf 0-2/hpf   RBC / HPF none seen 0-2/hpf   Squamous Epithelial / LPF Rare(0-4/hpf) Rare(0-4/hpf)  TSH     Status: None   Collection Time: 02/24/20  4:13 PM  Result Value Ref Range   TSH 2.52 0.35 - 4.50 uIU/mL  CBC with Differential/Platelet     Status: None   Collection Time: 02/24/20  4:13 PM  Result Value Ref Range   WBC 5.3 4.0 - 10.5 K/uL   RBC 4.33 4.22 - 5.81 Mil/uL   Hemoglobin 14.0 13.0 - 17.0 g/dL   HCT 40.7 39.0 - 52.0 %   MCV 94.1 78.0 - 100.0 fl   MCHC 34.4 30.0 - 36.0 g/dL   RDW 14.1 11.5 - 15.5 %   Platelets 250.0 150.0 - 400.0 K/uL   Neutrophils Relative % 63.1 43.0 - 77.0 %   Lymphocytes Relative 27.0 12.0 - 46.0 %   Monocytes Relative 7.3 3.0 - 12.0 %   Eosinophils Relative 2.0 0.0 - 5.0 %   Basophils Relative 0.6 0.0 - 3.0 %   Neutro Abs 3.4 1.4 - 7.7 K/uL   Lymphs Abs 1.4 0.7 - 4.0 K/uL   Monocytes Absolute 0.4 0.1 - 1.0 K/uL   Eosinophils Absolute 0.1 0.0 - 0.7 K/uL   Basophils Absolute 0.0 0.0 - 0.1 K/uL  Lipid panel     Status: None   Collection Time: 02/24/20  4:13 PM  Result Value Ref Range   Cholesterol 134 0 - 200 mg/dL    Comment: ATP III Classification       Desirable:  < 200 mg/dL               Borderline High:  200 - 239 mg/dL          High:  > = 240 mg/dL   Triglycerides 53.0 0.0 - 149.0 mg/dL    Comment: Normal:  <150 mg/dLBorderline High:  150 - 199 mg/dL   HDL 59.30 >39.00 mg/dL   VLDL 10.6 0.0 - 40.0 mg/dL   LDL Cholesterol 64 0 - 99 mg/dL   Total CHOL/HDL Ratio 2     Comment:                Men          Women1/2 Average Risk     3.4          3.3Average Risk          5.0          4.42X Average Risk          9.6          7.13X Average Risk          15.0          11.0  NonHDL 75.07     Comment: NOTE:  Non-HDL goal should be 30 mg/dL higher than patient's LDL goal (i.e. LDL goal of < 70 mg/dL, would have non-HDL goal of < 100 mg/dL)  Hemoglobin A1c     Status: None   Collection Time: 02/24/20  4:13 PM  Result  Value Ref Range   Hgb A1c MFr Bld 5.9 4.6 - 6.5 %    Comment: Glycemic Control Guidelines for People with Diabetes:Non Diabetic:  <6%Goal of Therapy: <7%Additional Action Suggested:  >8%   HIV Antibody (routine testing w rflx)     Status: None   Collection Time: 02/24/20  4:13 PM  Result Value Ref Range   HIV 1&2 Ab, 4th Generation NON-REACTIVE NON-REACTI    Comment: HIV-1 antigen and HIV-1/HIV-2 antibodies were not detected. There is no laboratory evidence of HIV infection. [name] PLEASE NOTE: This information has been disclosed to you from records whose confidentiality may be protected by state law.  If your state requires such protection, then the state law prohibits you from making any further disclosure of the information without the specific written consent of the person to whom it pertains, or as otherwise permitted by law. A general authorization for the release of medical or other information is NOT sufficient for this purpose. . For additional information please refer to http://education.[facility].com/faq/FAQ106 (This link is being provided for informational/ educational purposes only.) . [name] The performance of this assay has not been clinically validated in patients less than 14 years old. [name]   HSV 2 antibody, IgG     Status: None   Collection Time: 02/24/20  4:13 PM  Result Value Ref Range   HSV 2 Glycoprotein G Ab, IgG <0.90 index    Comment:                           Index          Interpretation                           -----          --------------                           <0.90          Negative                           0.90-1.09      Equivocal                           >1.09          Positive . This assay utilizes recombinant type-specific antigens to differentiate HSV-1 from HSV-2 infections. A positive result cannot distinguish between recent and past infection. If recent HSV infection is suspected but the results are negative or equivocal, the  assay should be repeated in 4-6 weeks. The performance characteristics of the assay have not been established for pediatric populations, immunocompromised patients, or neonatal screening.   RPR     Status: None   Collection Time: 02/24/20  4:13 PM  Result Value Ref Range   RPR Ser Ql NON-REACTIVE NON-REACTI  GC/Chlamydia Probe Amp     Status: None   Collection Time: 02/24/20  4:13 PM   Specimen: Blood   BLD  Result Value  Ref Range   Chlamydia trachomatis, NAA Negative Negative   Neisseria Gonorrhoeae by PCR Negative Negative  POCT glycosylated hemoglobin (Hb A1C)     Status: Abnormal   Collection Time: 02/24/20  4:34 PM  Result Value Ref Range   Hemoglobin A1C 5.7 (A) 4.0 - 5.6 %   HbA1c POC (<> result, manual entry)     HbA1c, POC (prediabetic range)     HbA1c, POC (controlled diabetic range)      Psychiatric Specialty Exam: Physical Exam  Review of Systems  Weight (!) 325 lb (147.4 kg).There is no height or weight on file to calculate BMI.  General Appearance: NA  Eye Contact:  NA  Speech:  Slow  Volume:  Decreased  Mood:  Dysphoric and Irritable  Affect:  NA  Thought Process:  Goal Directed  Orientation:  Full (Time, Place, and Person)  Thought Content:  Rumination  Suicidal Thoughts:  No  Homicidal Thoughts:  No  Memory:  Immediate;   Good Recent;   Good Remote;   Good  Judgement:  Fair  Insight:  Shallow  Psychomotor Activity:  NA  Concentration:  Concentration: Fair and Attention Span: Fair  Recall:  Good  Fund of Knowledge:  Good  Language:  Good  Akathisia:  No  Handed:  Right  AIMS (if indicated):     Assets:  Communication Skills Desire for Improvement Housing  ADL's:  Intact  Cognition:  WNL  Sleep:   fair      Assessment and Plan: Bipolar disorder type I.  Anxiety.  Grief.  I reviewed blood work results, current medication.  Recommend to discontinue Ambien since patient is already taking trazodone and melatonin.  He also has not noticed a  change in his sleep with the Ambien.  He is requesting to increase Abilify 10 mg to help his mood lability.  I agree because it can also help his sleep and anxiety.  Continue Cymbalta 30 mg twice a day, Cogentin 0.5 mg at bedtime, increase Abilify from 5 mg to 10 mg and continue trazodone 150 mg at bedtime.  Encouraged to keep therapy with Dr. Cheryln Manly to address his grief.  Encourage healthy dietary habits and keep his recommended eating regimen.  He is hoping to move to his own place and he can still call his nephew if needed any help.  Discussed medication side effects and benefits.  Recommended to call us back if is any question or any concern.  Follow-up in 3 months.  Follow Up Instructions:    I discussed the assessment and treatment plan with the patient. The patient was provided an opportunity to ask questions and all were answered. The patient agreed with the plan and demonstrated an understanding of the instructions.   The patient was advised to call back or seek an in-person evaluation if the symptoms worsen or if the condition fails to improve as anticipated.  I provided 23 minutes of non-face-to-face time during this encounter.   Kathlee Nations, MD

## 2020-05-19 ENCOUNTER — Other Ambulatory Visit (HOSPITAL_COMMUNITY): Payer: Self-pay | Admitting: Psychiatry

## 2020-05-19 DIAGNOSIS — F319 Bipolar disorder, unspecified: Secondary | ICD-10-CM

## 2020-05-19 NOTE — Telephone Encounter (Signed)
He finished the order I will bring down to you

## 2020-05-25 ENCOUNTER — Other Ambulatory Visit: Payer: Self-pay | Admitting: Internal Medicine

## 2020-05-26 ENCOUNTER — Telehealth: Payer: Self-pay | Admitting: *Deleted

## 2020-05-26 NOTE — Telephone Encounter (Signed)
   [facility] Pre-operative Risk Assessment    HEARTCARE STAFF: - Please ensure there is not already an duplicate clearance open for this procedure. - Under Visit Info/Reason for Call, type in Other and utilize the format Clearance MM/DD/YY or Clearance TBD. Do not use dashes or single digits. - If request is for dental extraction, please clarify the # of teeth to be extracted.  Request for surgical clearance:  1. What type of surgery is being performed? MULTI-LEVEL LUMBAR FUSION   2. When is this surgery scheduled? TBD   3. What type of clearance is required (medical clearance vs. Pharmacy clearance to hold med vs. Both)? MEDICAL  4. Are there any medications that need to be held prior to surgery and how long? EFFIENT AND ASA   5. Practice name and name of physician performing surgery? [facility]; DR. [name] NITKA   6. What is the office phone number? [phone]   7.   What is the office fax number? [place].   Anesthesia type (None, local, MAC, general) ? GENERAL   Julaine Hua 05/26/2020, 3:18 PM  _________________________________________________________________   (provider comments below)

## 2020-05-26 NOTE — Telephone Encounter (Signed)
Dr Julianne Handler this patient is on Effient and aspirin.  In reviewing his chart there have been different recommendations on hold time for different procedures.  He now needs lumbar fusion.  Is a 7 day hold on both appropriate ?  Thanks, please respond to [place]

## 2020-05-27 NOTE — Telephone Encounter (Signed)
   Primary Cardiologist: Lauree Chandler, MD  Chart reviewed as part of pre-operative protocol coverage. Given past medical history and time since last visit, based on ACC/AHA guidelines, SALIOU BARNIER Sr. would be at acceptable risk for the planned procedure without further cardiovascular testing.   Per Dr. Angelena Form, he may hold ASA and Effient prior to procedure and restart once stable from a surgical standpoint.   The patient was advised that if he develops new symptoms prior to surgery to contact our office to arrange for a follow-up visit, and he verbalized understanding.  I will route this recommendation to the requesting party via Epic fax function and remove from pre-op pool.  Please call with questions.  Kathyrn Drown, NP 05/27/2020, 9:03 AM

## 2020-05-27 NOTE — Telephone Encounter (Signed)
Holding Effient and ASA for 7 days is ok with me. Lauree Chandler

## 2020-05-27 NOTE — Telephone Encounter (Signed)
I called patient and scheduled surgery for 06/29/20.

## 2020-05-31 ENCOUNTER — Telehealth: Payer: Self-pay | Admitting: Orthopedic Surgery

## 2020-05-31 ENCOUNTER — Other Ambulatory Visit: Payer: Self-pay | Admitting: Internal Medicine

## 2020-05-31 NOTE — Telephone Encounter (Signed)
Patient called needing a Rx faxed over to [facility] for socks that fit over his stump. The number to contact patient is [phone]

## 2020-06-01 NOTE — Telephone Encounter (Signed)
Order written will have signed and fax today.

## 2020-06-05 ENCOUNTER — Other Ambulatory Visit (HOSPITAL_COMMUNITY): Payer: Self-pay | Admitting: Psychiatry

## 2020-06-05 DIAGNOSIS — F419 Anxiety disorder, unspecified: Secondary | ICD-10-CM

## 2020-06-07 ENCOUNTER — Telehealth: Payer: Self-pay

## 2020-06-07 ENCOUNTER — Other Ambulatory Visit: Payer: Self-pay

## 2020-06-07 NOTE — Telephone Encounter (Signed)
Yes I think that is correct

## 2020-06-07 NOTE — Telephone Encounter (Signed)
Key: [id number]  Dx needed to proceed with completion of PA.  Will forward to PCP for clarification.

## 2020-06-09 ENCOUNTER — Ambulatory Visit: Payer: Medicare HMO | Admitting: Internal Medicine

## 2020-06-09 ENCOUNTER — Telehealth: Payer: Self-pay

## 2020-06-09 NOTE — Telephone Encounter (Signed)
PA completed for Testostrerone 1.62 gel

## 2020-06-11 ENCOUNTER — Other Ambulatory Visit: Payer: Self-pay

## 2020-06-15 ENCOUNTER — Other Ambulatory Visit: Payer: Self-pay

## 2020-06-16 ENCOUNTER — Ambulatory Visit (INDEPENDENT_AMBULATORY_CARE_PROVIDER_SITE_OTHER): Payer: Medicare HMO | Admitting: Internal Medicine

## 2020-06-16 ENCOUNTER — Other Ambulatory Visit: Payer: Self-pay

## 2020-06-16 ENCOUNTER — Encounter: Payer: Self-pay | Admitting: Internal Medicine

## 2020-06-16 VITALS — BP 130/74 | HR 58 | Temp 98.6°F | Ht 73.0 in | Wt 313.0 lb

## 2020-06-16 DIAGNOSIS — E669 Obesity, unspecified: Secondary | ICD-10-CM | POA: Diagnosis not present

## 2020-06-16 DIAGNOSIS — E559 Vitamin D deficiency, unspecified: Secondary | ICD-10-CM

## 2020-06-16 DIAGNOSIS — E1169 Type 2 diabetes mellitus with other specified complication: Secondary | ICD-10-CM

## 2020-06-16 DIAGNOSIS — J338 Other polyp of sinus: Secondary | ICD-10-CM

## 2020-06-16 DIAGNOSIS — I10 Essential (primary) hypertension: Secondary | ICD-10-CM

## 2020-06-16 MED ORDER — TESTOSTERONE 1.62 % TD GEL: 1.0000 | Freq: Every day | TRANSDERMAL | 1 refills | Status: DC

## 2020-06-16 NOTE — Progress Notes (Signed)
Established Patient Office Visit  Subjective:  Patient ID: Travis Kroening., male    DOB: 1950-10-26  Age: 70 y.o. MRN: [id number]      Chief Complaint: follow up HTN, HLD and hyperglycemia and needs referral for sinusitis/nasal polyp       HPI:  Travis Kinn. is a 70 y.o. male here overall doing well, good compliance with meds, has ongoing nasal polyp and needs referral for management, not currently having fever or pain but has difficulty with drainage.  CBGs in low 100s, Pt denies chest pain, increased sob or doe, wheezing, orthopnea, PND, increased LE swelling, palpitations, dizziness or syncope.  Pt denies new neurological symptoms such as new headache, or facial or extremity weakness or numbness   Pt denies polydipsia, polyuria and has been able to lose wt with better diet.  States BP at home has been < 140/90 despite BP here, did not take meds this am and struggled to get here  Pt continues to have recurring LBP without change in severity, bowel or bladder change, fever, wt loss,  worsening LE pain/numbness/weakness, gait change or falls, but has scheduled for lumbar surgury feb 8 with multilevel fusion .        Wt Readings from Last 3 Encounters:  06/17/20 (!) 313 lb (142 kg)  06/16/20 (!) 313 lb (142 kg)  04/28/20 (!) 325 lb (147.4 kg)   BP Readings from Last 3 Encounters:  06/17/20 (!) 212/37  06/16/20 130/74  04/28/20 (!) 166/80         Past Medical History:  Diagnosis Date  . ALLERGIC RHINITIS 06/24/2009  . Allergy   . Anemia    IDA  . Anxiety 11/12/2011   Adequate for discharge   . Arthritis    "all my joints" (09/30/2013)  . Arthritis of foot, right, degenerative 04/15/2014  . Balance disorder 03/12/2013  . Benign neoplasm of cecum   . Benign neoplasm of descending colon   . CAD (coronary artery disease) 06/24/2009   5 stents placed in 2007    . Chronic anticoagulation   . Chronic pain syndrome 10/27/2009   of ankle, shoulders, low back.  sciatica.   .  Closed fracture of right foot 10/17/2014  . CORONARY ARTERY DISEASE 06/24/2009   a. s/p multiple PCIs - In 2008 he had a Taxus DES to the mild LAD, Endeavor DES to mid LCX and distal LCX. In January 2009 he had DES to distal LCX, mid LCX and proximal LCX. In November 2009 had BMS x 2 to the mid RCA. Cath 10/2011 with patent stents, noncardiac CP. LHC 01/2013: patent stents (noncardiac CP).  . DEGENERATIVE JOINT DISEASE 06/24/2009   Qualifier: Diagnosis of  By: [name], [name]   . Depression   . Depression with anxiety    Prior suicide attempt(08/25/19-pt states not suicide attempt)  . [place] DISEASE, LUMBAR 04/19/2010  . ERECTILE DYSFUNCTION, ORGANIC 05/30/2010  . Essential hypertension 06/24/2009   Qualifier: Diagnosis of  By: [name], [name]   . Fibromyalgia   . Fracture dislocation of ankle joint 09/02/2015  . Gait disorder 03/12/2013  . General weakness 07/14/2014  . GERD (gastroesophageal reflux disease) 09/08/2015   08/25/15-pt states was cardiac origin, not GERD  . Hand joint pain 06/10/2013  . Heart murmur   . Hepatitis C    treated pt. unknown with what he was a teenager  . History of kidney stones   . Hyperlipidemia 07/15/2009   Qualifier: Diagnosis  of  By: Aundra Dubin, MD, [place]    . HYPERLIPIDEMIA-MIXED 07/15/2009   08/25/19-pt state cholesterol was normal range  . HYPERTENSION 06/24/2009  . Insomnia 10/04/2011  . Irregular heart beat   . Left hip pain 03/12/2013   Injected under ultrasound guidance on June 24, 2013   . Major depression 09/13/2015  . Myocardial infarction ([place]) 2008  . Non-cardiac chest pain 10/2011, 01/2013  . Obesity   . Occult blood positive stool 10/17/2014  . Open ankle fracture 09/02/2015  . OSA (obstructive sleep apnea)    not using CPAP (09/30/2013)  . Pain of right thumb 04/03/2013  . Pneumonia   . PPD positive 04/08/2015  . Pre-ulcerative corn or callous 02/06/2013  . Rotator cuff tear arthropathy of both shoulders 06/10/2013   History of bilateral shoulder cuff  surgery for rotator cuff tears. Reports increase in pain 09/11/2015 during physical therapy of the left shoulder.   [name] SCIATICA, LEFT 04/19/2010   Qualifier: Diagnosis of  By: [name], [name]   . Sleep apnea    wears cpap 08/25/19-States not using due to nasal stuffiness.  [name] Spinal stenosis in cervical region 09/26/2013  . Spinal stenosis, lumbar region, with neurogenic claudication 09/26/2013  . Type II diabetes mellitus ([place]) 2012   no meds in 09/2014.   [name] Uncontrolled type 2 DM with peripheral circulatory disorder ([place]) 10/04/2013   08/25/19- pt states A1C normal, no diabetes  . URETHRAL STRICTURE 06/24/2009   self catheterizes.    Past Surgical History:  Procedure Laterality Date  . AMPUTATION Right 06/14/2016   Procedure: AMPUTATION BELOW KNEE;  Surgeon: Newt Minion, MD;  Location: [facility];  Service: Orthopedics;  Laterality: Right;  . ANKLE FUSION Right 04/15/2014   Procedure: Right Subtalar, Talonavicular Fusion;  Surgeon: Newt Minion, MD;  Location: [place];  Service: Orthopedics;  Laterality: Right;  . ANKLE FUSION Right 04/18/2016   Procedure: Right Ankle Tibiocalcaneal Fusion;  Surgeon: Newt Minion, MD;  Location: [place];  Service: Orthopedics;  Laterality: Right;  . ANTERIOR CERVICAL DECOMP/DISCECTOMY FUSION N/A 09/26/2013   Procedure: ANTERIOR CERVICAL DISCECTOMY FUSION C3-4, plate and screw fixation, allograft bone graft;  Surgeon: Jessy Oto, MD;  Location: [facility];  Service: Orthopedics;  Laterality: N/A;  . BACK SURGERY     3  . BELOW KNEE LEG AMPUTATION Right 06/14/2016   right ankle and foot  . CARDIAC CATHETERIZATION  X 1  . CARPAL TUNNEL RELEASE Bilateral   . COLONOSCOPY N/A 10/22/2014   Procedure: COLONOSCOPY;  Surgeon: Lafayette Dragon, MD;  Location: [facility] ENDOSCOPY;  Service: Endoscopy;  Laterality: N/A;  . COLONOSCOPY  11/19/2018  . CORONARY ANGIOPLASTY WITH STENT PLACEMENT     "I have 9 stents"  . ESOPHAGOGASTRODUODENOSCOPY N/A 10/19/2014   Procedure:  ESOPHAGOGASTRODUODENOSCOPY (EGD);  Surgeon: Jerene Bears, MD;  Location: [facility] ENDOSCOPY;  Service: Endoscopy;  Laterality: N/A;  . FRACTURE SURGERY    . FUSION OF TALONAVICULAR JOINT Right 04/15/2014   dr duda  . GASTRIC BYPASS  02/20/2019  . HERNIA REPAIR     umbilical  . INGUINAL HERNIA REPAIR Right 05/11/2015   Procedure: LAPAROSCOPIC REPAIR RIGHT  INGUINAL HERNIA;  Surgeon: Greer Pickerel, MD;  Location: [place];  Service: General;  Laterality: Right;  . INSERTION OF MESH Right 05/11/2015   Procedure: INSERTION OF MESH;  Surgeon: Greer Pickerel, MD;  Location: [place];  Service: General;  Laterality: Right;  . JOINT REPLACEMENT    . KNEE CARTILAGE SURGERY  Right X 12   "~ 1/2 open; ~ 1/2 scopes"  . KNEE CARTILAGE SURGERY Left X 3   "3 scopes"  . LAPAROSCOPIC GASTRIC SLEEVE RESECTION N/A 03/03/2019   Procedure: LAPAROSCOPIC GASTRIC SLEEVE RESECTION, Upper Endo, ERAS Pathway;  Surgeon: Greer Pickerel, MD;  Location: [facility];  Service: General;  Laterality: N/A;  . LEFT HEART CATHETERIZATION WITH CORONARY ANGIOGRAM N/A 02/10/2013   Procedure: LEFT HEART CATHETERIZATION WITH CORONARY ANGIOGRAM;  Surgeon: Burnell Blanks, MD;  Location: [facility] CATH LAB;  Service: Cardiovascular;  Laterality: N/A;  . LUMBAR LAMINECTOMY N/A 07/16/2018   Procedure: LEFT L4-5 REDO PARTIAL LUMBAR HEMILAMINECTOMY WITH FORAMINOTOMY LEFT L4;  Surgeon: Jessy Oto, MD;  Location: [place];  Service: Orthopedics;  Laterality: N/A;  . LUMBAR LAMINECTOMY/DECOMPRESSION MICRODISCECTOMY N/A 01/27/2014   Procedure: CENTRAL LUMBAR LAMINECTOMY L4-5 AND L3-4;  Surgeon: Jessy Oto, MD;  Location: [place];  Service: Orthopedics;  Laterality: N/A;  . ORIF ANKLE FRACTURE Right 09/02/2015   Procedure: OPEN REDUCTION INTERNAL FIXATION (ORIF) ANKLE FRACTURE;  Surgeon: Newt Minion, MD;  Location: [place];  Service: Orthopedics;  Laterality: Right;  . PERIPHERALLY INSERTED CENTRAL CATHETER INSERTION  09/02/2015  . POLYPECTOMY    . ROTATOR CUFF REPAIR  Left    x 2  . ROTATOR CUFF REPAIR Right    x 3  . SHOULDER ARTHROSCOPY W/ ROTATOR CUFF REPAIR Bilateral    "3 on the right; 1 on the left"  . SKIN SPLIT GRAFT Right 10/01/2015   Procedure: RIGHT ANKLE APPLY SKIN GRAFT SPLIT THICKNESS;  Surgeon: Newt Minion, MD;  Location: [place];  Service: Orthopedics;  Laterality: Right;  . TONSILLECTOMY    . TOOTH EXTRACTION    . TOTAL HIP ARTHROPLASTY Right 04/17/2018  . TOTAL HIP ARTHROPLASTY Right 04/17/2018   Procedure: RIGHT TOTAL HIP ARTHROPLASTY;  Surgeon: Newt Minion, MD;  Location: [place];  Service: Orthopedics;  Laterality: Right;  . TOTAL HIP ARTHROPLASTY Left 06/06/2019   Procedure: LEFT TOTAL HIP ARTHROPLASTY ANTERIOR APPROACH;  Surgeon: Mcarthur Rossetti, MD;  Location: [place];  Service: Orthopedics;  Laterality: Left;  . TOTAL HIP REVISION Left 05/24/2019  . TOTAL KNEE ARTHROPLASTY Bilateral 2008  . UMBILICAL HERNIA REPAIR     UHR  . UPPER GASTROINTESTINAL ENDOSCOPY  2016  . URETHRAL DILATION  X 4  . VASECTOMY    . WISDOM TOOTH EXTRACTION      reports that he quit smoking about 9 years ago. His smoking use included cigars. He has never used smokeless tobacco. He reports previous alcohol use. He reports that he does not use drugs. family history includes Breast cancer in his mother; Colon polyps in his brother; Coronary artery disease in an other family member; Depression in his brother, mother, and another family member; Diabetes in his father; Early death in his maternal grandfather, paternal grandfather, and son; Esophageal cancer in his mother; Healthy in his son; Heart attack (age of onset: 33) in his son; Heart attack (age of onset: 109) in his maternal grandfather; Heart disease in his father, maternal grandfather, and mother; Hyperlipidemia in his father; Hypertension in his brother, father, mother, and another family member; Pancreatic cancer in his mother; Prostate cancer in his father; Skin cancer in his father; Stomach  cancer in his mother. No Known Allergies Current Outpatient Medications on File Prior to Visit  Medication Sig Dispense Refill  . albuterol (VENTOLIN HFA) 108 (90 Base) MCG/ACT inhaler Inhale 2 puffs into the lungs every 6 (six)  hours as needed for wheezing or shortness of breath. 18 g 1  . amoxicillin (AMOXIL) 500 MG capsule Take 2,000 mg by mouth daily as needed. AS NEEDED FOR DENTAL WORK    . ARIPiprazole (ABILIFY) 10 MG tablet Take 1 tablet (10 mg total) by mouth daily. 90 tablet 0  . aspirin 81 MG chewable tablet Chew 1 tablet (81 mg total) by mouth daily. 35 tablet 0  . atorvastatin (LIPITOR) 20 MG tablet Take 1 tablet (20 mg total) by mouth daily. (Patient taking differently: Take 20 mg by mouth daily.) 90 tablet 0  . benztropine (COGENTIN) 0.5 MG tablet Take 1 tablet (0.5 mg total) by mouth at bedtime. 90 tablet 0  . calcium carbonate (TUMS - DOSED IN MG ELEMENTAL CALCIUM) 500 MG chewable tablet Chew 1,000 mg by mouth at bedtime.    . Calcium Polycarbophil (FIBER-CAPS PO) Take 3 capsules by mouth at bedtime.    . celecoxib (CELEBREX) 200 MG capsule Take 1 capsule (200 mg total) by mouth 2 (two) times daily. (Patient taking differently: Take 200 mg by mouth 2 (two) times daily.) 180 capsule 3  . DULoxetine (CYMBALTA) 30 MG capsule Take 1 capsule (30 mg total) by mouth 2 (two) times daily. 180 capsule 0  . gabapentin (NEURONTIN) 800 MG tablet Take 800 mg by mouth 3 (three) times daily.     . hydrochlorothiazide (HYDRODIURIL) 25 MG tablet Take 1 tablet (25 mg total) by mouth daily. (Patient taking differently: Take 25 mg by mouth daily.) 90 tablet 3  . HYDROcodone-acetaminophen (NORCO) 10-325 MG tablet Take 1 tablet by mouth every 8 (eight) hours as needed for moderate pain.     . iron polysaccharides (NU-IRON) 150 MG capsule Take 1 capsule (150 mg total) by mouth daily. 90 capsule 1  . Melatonin 5 MG TABS Take 20 mg by mouth at bedtime.     . methocarbamol (ROBAXIN) 500 MG tablet Take 1  tablet (500 mg total) by mouth every 8 (eight) hours as needed for muscle spasms. (Patient taking differently: Take 500 mg by mouth every 8 (eight) hours as needed.) 90 tablet 1  . Multiple Vitamins-Minerals (BARIATRIC MULTIVITAMINS/IRON PO) Take 1 tablet by mouth daily.    [name] MYRBETRIQ 50 MG TB24 tablet Take 50 mg by mouth at bedtime.     . Omega-3 Fatty Acids (FISH OIL) 1000 MG CAPS Take 1,000 mg by mouth at bedtime.     [name] oxybutynin (DITROPAN-XL) 10 MG 24 hr tablet Take 10 mg by mouth daily.    [name] oxymetazoline (AFRIN) 0.05 % nasal spray Place 1 spray into both nostrils 2 (two) times daily as needed for congestion.    . Pancrelipase, Lip-Prot-Amyl, (ZENPEP) 40000-126000 units CPEP Take 2 capsules (80,000 units) with meals and 1 capsule (40,000 units) with each snack (Patient taking differently: Take 1-2 capsules by mouth See admin instructions. Take 2 capsules (80,000 units) with meals and 1 capsule (40,000 units) with each snack) 720 capsule 1  . pantoprazole (PROTONIX) 40 MG tablet Take 1 tablet (40 mg total) by mouth 2 (two) times daily. (Patient taking differently: Take 40 mg by mouth 2 (two) times daily before a meal.) 60 tablet 2  . phentermine 37.5 MG capsule Take 1 capsule (37.5 mg total) by mouth every morning. (Patient not taking: No sig reported) 30 capsule 2  . prasugrel (EFFIENT) 5 MG TABS tablet Take 1 tablet (5 mg total) by mouth daily. 90 tablet 3  . sildenafil (VIAGRA) 100 MG tablet Take 100  mg by mouth daily as needed for erectile dysfunction.     . traZODone (DESYREL) 150 MG tablet Take 1 tablet (150 mg total) by mouth at bedtime. 90 tablet 0  . zolpidem (AMBIEN) 10 MG tablet Take 10 mg by mouth at bedtime as needed for sleep.     No current facility-administered medications on file prior to visit.        ROS:  All others reviewed and negative.  Objective        PE:  BP 130/74   Pulse (!) 58   Temp 98.6 F (37 C) (Oral)   Ht 6' 1"  (1.854 m)   Wt (!) 313 lb (142 kg)    SpO2 96%   BMI 41.30 kg/m                 Constitutional: Pt appears in NAD               HENT: Head: NCAT.                Right Ear: External ear normal.                 Left Ear: External ear normal.                Eyes: . Pupils are equal, round, and reactive to light. Conjunctivae and EOM are normal               Nose: without d/c or deformity               Neck: Neck supple. Gross normal ROM               Cardiovascular: Normal rate and regular rhythm.                 Pulmonary/Chest: Effort normal and breath sounds without rales or wheezing.                Abd:  Soft, NT, ND, + BS, no organomegaly               Neurological: Pt is alert. At baseline orientation, motor grossly intact               Skin: Skin is warm. No rashes, no other new lesions, LE edema - none               Psychiatric: Pt behavior is normal without agitation   Assessment/Plan:  Festus Pursel. is a 70 y.o. White or Caucasian [1] male with  has a past medical history of ALLERGIC RHINITIS (06/24/2009), Allergy, Anemia, Anxiety (11/12/2011), Arthritis, Arthritis of foot, right, degenerative (04/15/2014), Balance disorder (03/12/2013), Benign neoplasm of cecum, Benign neoplasm of descending colon, CAD (coronary artery disease) (06/24/2009), Chronic anticoagulation, Chronic pain syndrome (10/27/2009), Closed fracture of right foot (10/17/2014), CORONARY ARTERY DISEASE (06/24/2009), DEGENERATIVE JOINT DISEASE (06/24/2009), Depression, Depression with anxiety, DISC DISEASE, LUMBAR (04/19/2010), ERECTILE DYSFUNCTION, ORGANIC (05/30/2010), Essential hypertension (06/24/2009), Fibromyalgia, Fracture dislocation of ankle joint (09/02/2015), Gait disorder (03/12/2013), General weakness (07/14/2014), GERD (gastroesophageal reflux disease) (09/08/2015), Hand joint pain (06/10/2013), Heart murmur, Hepatitis C, History of kidney stones, Hyperlipidemia (07/15/2009), HYPERLIPIDEMIA-MIXED (07/15/2009), HYPERTENSION (06/24/2009), Insomnia (10/04/2011),  Irregular heart beat, Left hip pain (03/12/2013), Major depression (09/13/2015), Myocardial infarction ([place]) (2008), Non-cardiac chest pain (10/2011, 01/2013), Obesity, Occult blood positive stool (10/17/2014), Open ankle fracture (09/02/2015), OSA (obstructive sleep apnea), Pain of right thumb (04/03/2013), Pneumonia, PPD positive (04/08/2015), Pre-ulcerative corn or callous (02/06/2013), Rotator cuff tear arthropathy  of both shoulders (06/10/2013), SCIATICA, LEFT (04/19/2010), Sleep apnea, Spinal stenosis in cervical region (09/26/2013), Spinal stenosis, lumbar region, with neurogenic claudication (09/26/2013), Type II diabetes mellitus ([place]) (2012), Uncontrolled type 2 DM with peripheral circulatory disorder ([place]) (10/04/2013), and URETHRAL STRICTURE (06/24/2009).   Micro: none  Cardiac tracings I have personally interpreted today:  none  Pertinent Radiological findings (summarize): none   Lab Results  Component Value Date   WBC 5.3 02/24/2020   HGB 14.0 02/24/2020   HCT 40.7 02/24/2020   PLT 250.0 02/24/2020   GLUCOSE 106 (H) 02/24/2020   CHOL 134 02/24/2020   TRIG 53.0 02/24/2020   HDL 59.30 02/24/2020   LDLCALC 64 02/24/2020   ALT 15 02/24/2020   AST 15 02/24/2020   NA 137 02/24/2020   K 4.4 02/24/2020   CL 103 02/24/2020   CREATININE 1.17 02/24/2020   BUN 27 (H) 02/24/2020   CO2 26 02/24/2020   TSH 2.52 02/24/2020   PSA 0.56 07/24/2019   INR 1.1 06/03/2019   HGBA1C 5.7 (A) 02/24/2020   MICROALBUR <0.7 07/24/2019     Assessment & Plan:   Problem List Items Addressed This Visit      Medium   Vitamin D deficiency    Last vitamin D Lab Results  Component Value Date   VD25OH 29.84 (L) 02/24/2020   Low at last check, to cont oral replacement, delcines f/u lab today      Nasal sinus polyp - Primary    No s/s infection, for ENT referral for further management      Relevant Orders   Ambulatory referral to ENT   Essential hypertension (Chronic)    Markedly elevated but ok at  home per pt, to take all meds as prescribed and continue to monitor BP at home with goal to be at least < 140/90      Diabetes mellitus type 2 in obese [facility])    Lab Results  Component Value Date   HGBA1C 5.7 (A) 02/24/2020   Stable, pt to continue current medical treatment - diet  Current Outpatient Medications (Endocrine & Metabolic):  [name]  Testosterone 1.62 % GEL, Place 1 Pump onto the skin daily.  Current Outpatient Medications (Cardiovascular):  .  atorvastatin (LIPITOR) 20 MG tablet, Take 1 tablet (20 mg total) by mouth daily. (Patient taking differently: Take 20 mg by mouth daily.) .  hydrochlorothiazide (HYDRODIURIL) 25 MG tablet, Take 1 tablet (25 mg total) by mouth daily. (Patient taking differently: Take 25 mg by mouth daily.) .  sildenafil (VIAGRA) 100 MG tablet, Take 100 mg by mouth daily as needed for erectile dysfunction.   Current Outpatient Medications (Respiratory):  .  albuterol (VENTOLIN HFA) 108 (90 Base) MCG/ACT inhaler, Inhale 2 puffs into the lungs every 6 (six) hours as needed for wheezing or shortness of breath. [name]  oxymetazoline (AFRIN) 0.05 % nasal spray, Place 1 spray into both nostrils 2 (two) times daily as needed for congestion.  Current Outpatient Medications (Analgesics):  .  aspirin 81 MG chewable tablet, Chew 1 tablet (81 mg total) by mouth daily. .  celecoxib (CELEBREX) 200 MG capsule, Take 1 capsule (200 mg total) by mouth 2 (two) times daily. (Patient taking differently: Take 200 mg by mouth 2 (two) times daily.) .  HYDROcodone-acetaminophen (NORCO) 10-325 MG tablet, Take 1 tablet by mouth every 8 (eight) hours as needed for moderate pain.   Current Outpatient Medications (Hematological):  .  iron polysaccharides (NU-IRON) 150 MG capsule, Take 1 capsule (150 mg total)  by mouth daily. .  prasugrel (EFFIENT) 5 MG TABS tablet, Take 1 tablet (5 mg total) by mouth daily.  Current Outpatient Medications (Other):  .  amoxicillin (AMOXIL) 500 MG capsule,  Take 2,000 mg by mouth daily as needed. AS NEEDED FOR DENTAL WORK .  ARIPiprazole (ABILIFY) 10 MG tablet, Take 1 tablet (10 mg total) by mouth daily. .  benztropine (COGENTIN) 0.5 MG tablet, Take 1 tablet (0.5 mg total) by mouth at bedtime. .  calcium carbonate (TUMS - DOSED IN MG ELEMENTAL CALCIUM) 500 MG chewable tablet, Chew 1,000 mg by mouth at bedtime. .  Calcium Polycarbophil (FIBER-CAPS PO), Take 3 capsules by mouth at bedtime. .  DULoxetine (CYMBALTA) 30 MG capsule, Take 1 capsule (30 mg total) by mouth 2 (two) times daily. [name]  gabapentin (NEURONTIN) 800 MG tablet, Take 800 mg by mouth 3 (three) times daily.  .  Melatonin 5 MG TABS, Take 20 mg by mouth at bedtime.  .  methocarbamol (ROBAXIN) 500 MG tablet, Take 1 tablet (500 mg total) by mouth every 8 (eight) hours as needed for muscle spasms. (Patient taking differently: Take 500 mg by mouth every 8 (eight) hours as needed.) .  Multiple Vitamins-Minerals (BARIATRIC MULTIVITAMINS/IRON PO), Take 1 tablet by mouth daily. [name]  MYRBETRIQ 50 MG TB24 tablet, Take 50 mg by mouth at bedtime.  .  Omega-3 Fatty Acids (FISH OIL) 1000 MG CAPS, Take 1,000 mg by mouth at bedtime.  [name]  oxybutynin (DITROPAN-XL) 10 MG 24 hr tablet, Take 10 mg by mouth daily. .  Pancrelipase, Lip-Prot-Amyl, (ZENPEP) 40000-126000 units CPEP, Take 2 capsules (80,000 units) with meals and 1 capsule (40,000 units) with each snack (Patient taking differently: Take 1-2 capsules by mouth See admin instructions. Take 2 capsules (80,000 units) with meals and 1 capsule (40,000 units) with each snack) .  pantoprazole (PROTONIX) 40 MG tablet, Take 1 tablet (40 mg total) by mouth 2 (two) times daily. (Patient taking differently: Take 40 mg by mouth 2 (two) times daily before a meal.) .  phentermine 37.5 MG capsule, Take 1 capsule (37.5 mg total) by mouth every morning. (Patient not taking: No sig reported) .  traZODone (DESYREL) 150 MG tablet, Take 1 tablet (150 mg total) by mouth at  bedtime. [name]  zolpidem (AMBIEN) 10 MG tablet, Take 10 mg by mouth at bedtime as needed for sleep.          No orders of the defined types were placed in this encounter.   Follow-up: Return in about 3 months (around 09/14/2020).   [name], MD 06/19/2020 8:27 PM [facility]

## 2020-06-16 NOTE — Patient Instructions (Addendum)
Please continue all other medications as before, and refills have been done if requested.  Please have the pharmacy call with any other refills you may need.  Please continue your efforts at being more active, low cholesterol diet, and weight control.  Please keep your appointments with your specialists as you may have planned  You will be contacted regarding the referral for: ENT  Please make an Appointment to return in 3 months

## 2020-06-17 ENCOUNTER — Ambulatory Visit (INDEPENDENT_AMBULATORY_CARE_PROVIDER_SITE_OTHER): Payer: Medicare HMO | Admitting: Surgery

## 2020-06-17 ENCOUNTER — Encounter: Payer: Self-pay | Admitting: Surgery

## 2020-06-17 VITALS — BP 212/37 | HR 73 | Ht 73.0 in | Wt 313.0 lb

## 2020-06-17 DIAGNOSIS — M47816 Spondylosis without myelopathy or radiculopathy, lumbar region: Secondary | ICD-10-CM

## 2020-06-17 NOTE — Progress Notes (Signed)
70 year old white male history of L1-S1 spinal stenosis chronic low back pain and left buttock pain comes in for preop evaluation.  Symptoms unchanged from previous visit.  He is wanting to proceed with L1-S1 fusion as scheduled.  Today history and physical performed.  We have received preop cardiac clearance.  Surgical procedure discussed.  All questions answered.

## 2020-06-18 DIAGNOSIS — Z89511 Acquired absence of right leg below knee: Secondary | ICD-10-CM | POA: Diagnosis not present

## 2020-06-19 ENCOUNTER — Encounter: Payer: Self-pay | Admitting: Internal Medicine

## 2020-06-19 NOTE — Assessment & Plan Note (Signed)
Lab Results  Component Value Date   HGBA1C 5.7 (A) 02/24/2020   Stable, pt to continue current medical treatment - diet  Current Outpatient Medications (Endocrine & Metabolic):  [name]  Testosterone 1.62 % GEL, Place 1 Pump onto the skin daily.  Current Outpatient Medications (Cardiovascular):  .  atorvastatin (LIPITOR) 20 MG tablet, Take 1 tablet (20 mg total) by mouth daily. (Patient taking differently: Take 20 mg by mouth daily.) .  hydrochlorothiazide (HYDRODIURIL) 25 MG tablet, Take 1 tablet (25 mg total) by mouth daily. (Patient taking differently: Take 25 mg by mouth daily.) .  sildenafil (VIAGRA) 100 MG tablet, Take 100 mg by mouth daily as needed for erectile dysfunction.   Current Outpatient Medications (Respiratory):  .  albuterol (VENTOLIN HFA) 108 (90 Base) MCG/ACT inhaler, Inhale 2 puffs into the lungs every 6 (six) hours as needed for wheezing or shortness of breath. [name]  oxymetazoline (AFRIN) 0.05 % nasal spray, Place 1 spray into both nostrils 2 (two) times daily as needed for congestion.  Current Outpatient Medications (Analgesics):  .  aspirin 81 MG chewable tablet, Chew 1 tablet (81 mg total) by mouth daily. .  celecoxib (CELEBREX) 200 MG capsule, Take 1 capsule (200 mg total) by mouth 2 (two) times daily. (Patient taking differently: Take 200 mg by mouth 2 (two) times daily.) .  HYDROcodone-acetaminophen (NORCO) 10-325 MG tablet, Take 1 tablet by mouth every 8 (eight) hours as needed for moderate pain.   Current Outpatient Medications (Hematological):  .  iron polysaccharides (NU-IRON) 150 MG capsule, Take 1 capsule (150 mg total) by mouth daily. .  prasugrel (EFFIENT) 5 MG TABS tablet, Take 1 tablet (5 mg total) by mouth daily.  Current Outpatient Medications (Other):  .  amoxicillin (AMOXIL) 500 MG capsule, Take 2,000 mg by mouth daily as needed. AS NEEDED FOR DENTAL WORK .  ARIPiprazole (ABILIFY) 10 MG tablet, Take 1 tablet (10 mg total) by mouth daily. .  benztropine  (COGENTIN) 0.5 MG tablet, Take 1 tablet (0.5 mg total) by mouth at bedtime. .  calcium carbonate (TUMS - DOSED IN MG ELEMENTAL CALCIUM) 500 MG chewable tablet, Chew 1,000 mg by mouth at bedtime. .  Calcium Polycarbophil (FIBER-CAPS PO), Take 3 capsules by mouth at bedtime. .  DULoxetine (CYMBALTA) 30 MG capsule, Take 1 capsule (30 mg total) by mouth 2 (two) times daily. [name]  gabapentin (NEURONTIN) 800 MG tablet, Take 800 mg by mouth 3 (three) times daily.  .  Melatonin 5 MG TABS, Take 20 mg by mouth at bedtime.  .  methocarbamol (ROBAXIN) 500 MG tablet, Take 1 tablet (500 mg total) by mouth every 8 (eight) hours as needed for muscle spasms. (Patient taking differently: Take 500 mg by mouth every 8 (eight) hours as needed.) .  Multiple Vitamins-Minerals (BARIATRIC MULTIVITAMINS/IRON PO), Take 1 tablet by mouth daily. [name]  MYRBETRIQ 50 MG TB24 tablet, Take 50 mg by mouth at bedtime.  .  Omega-3 Fatty Acids (FISH OIL) 1000 MG CAPS, Take 1,000 mg by mouth at bedtime.  [name]  oxybutynin (DITROPAN-XL) 10 MG 24 hr tablet, Take 10 mg by mouth daily. .  Pancrelipase, Lip-Prot-Amyl, (ZENPEP) 40000-126000 units CPEP, Take 2 capsules (80,000 units) with meals and 1 capsule (40,000 units) with each snack (Patient taking differently: Take 1-2 capsules by mouth See admin instructions. Take 2 capsules (80,000 units) with meals and 1 capsule (40,000 units) with each snack) .  pantoprazole (PROTONIX) 40 MG tablet, Take 1 tablet (40 mg total) by mouth 2 (two)  times daily. (Patient taking differently: Take 40 mg by mouth 2 (two) times daily before a meal.) .  phentermine 37.5 MG capsule, Take 1 capsule (37.5 mg total) by mouth every morning. (Patient not taking: No sig reported) .  traZODone (DESYREL) 150 MG tablet, Take 1 tablet (150 mg total) by mouth at bedtime. [name]  zolpidem (AMBIEN) 10 MG tablet, Take 10 mg by mouth at bedtime as needed for sleep.

## 2020-06-19 NOTE — Assessment & Plan Note (Signed)
No s/s infection, for ENT referral for further management

## 2020-06-19 NOTE — Assessment & Plan Note (Signed)
Markedly elevated but ok at home per pt, to take all meds as prescribed and continue to monitor BP at home with goal to be at least < 140/90

## 2020-06-19 NOTE — Assessment & Plan Note (Signed)
Last vitamin D Lab Results  Component Value Date   VD25OH 29.84 (L) 02/24/2020   Low at last check, to cont oral replacement, delcines f/u lab today

## 2020-06-21 ENCOUNTER — Telehealth: Payer: Self-pay | Admitting: Internal Medicine

## 2020-06-21 NOTE — Telephone Encounter (Signed)
Please call the patient...re: medication refill prior authorization

## 2020-06-21 NOTE — Telephone Encounter (Signed)
[place] from [facility] called  A Prior Authorization is needed again for the   Testosterone 1.62 % GEL  The best contact # [phone]

## 2020-06-22 ENCOUNTER — Other Ambulatory Visit: Payer: Self-pay | Admitting: Internal Medicine

## 2020-06-22 ENCOUNTER — Telehealth: Payer: Self-pay

## 2020-06-22 NOTE — Telephone Encounter (Signed)
PA need more information I put in and sent to plan waiting on a response.

## 2020-06-23 NOTE — Progress Notes (Signed)
[place], [place] [address] [place] [address] Phone: [phone] Fax: [phone]      Your procedure is scheduled on February 8  Report to [facility] Main Entrance "A" at 0530 A.M., and check in at the Admitting office.  Call this number if you have problems the morning of surgery:  [phone]  Call [phone] if you have any questions prior to your surgery date Monday-Friday 8am-4pm    Remember:  Do not eat or drink after midnight the night before your surgery    Take these medicines the morning of surgery with A SIP OF WATER  albuterol (VENTOLIN HFA) if needed, Please bring all inhalers with you the day of surgery.  ARIPiprazole (ABILIFY) atorvastatin (LIPITOR) DULoxetine (CYMBALTA)  gabapentin (NEURONTIN)  HYDROcodone-acetaminophen (NORCO)  If needed for pain methocarbamol (ROBAXIN) if needed for muscle spasms oxybutynin (DITROPAN-XL)  pantoprazole (PROTONIX)   Follow your surgeon's instructions on when to stop prasugrel (EFFIENT) .  If no instructions were given by your surgeon then you will need to call the office to get those instructions.    Follow your surgeon's instructions on when to stop Aspirin.  If no instructions were given by your surgeon then you will need to call the office to get those instructions.    As of today, STOP taking any Aleve, Naproxen, Ibuprofen, Motrin, Advil, Goody's, BC's, all herbal medications, fish oil, and all vitamins.celecoxib (CELEBREX)                      Do not wear jewelry            Do not wear lotions, powders, colognes, or deodorant.            Men may shave face and neck.            Do not bring valuables to the hospital.            [facility] is not responsible for any belongings or valuables.  Do NOT Smoke (Tobacco/Vaping) or drink Alcohol 24 hours prior to your procedure If you use a CPAP at night, you may bring all equipment for your overnight stay.    Contacts, glasses, dentures or bridgework may not be worn into surgery.      For patients admitted to the hospital, discharge time will be determined by your treatment team.   Patients discharged the day of surgery will not be allowed to drive home, and someone needs to stay with them for 24 hours.    Special instructions:   [place]- Preparing For Surgery  Before surgery, you can play an important role. Because skin is not sterile, your skin needs to be as free of germs as possible. You can reduce the number of germs on your skin by washing with CHG (chlorahexidine gluconate) Soap before surgery.  CHG is an antiseptic cleaner which kills germs and bonds with the skin to continue killing germs even after washing.    Oral Hygiene is also important to reduce your risk of infection.  Remember - BRUSH YOUR TEETH THE MORNING OF SURGERY WITH YOUR REGULAR TOOTHPASTE  Please do not use if you have an allergy to CHG or antibacterial soaps. If your skin becomes reddened/irritated stop using the CHG.  Do not shave (including legs and underarms) for at least 48 hours prior to first CHG shower. It is OK to shave your face.  Please follow these instructions  carefully.   1. Shower the NIGHT BEFORE SURGERY and the MORNING OF SURGERY with CHG Soap.   2. If you chose to wash your hair, wash your hair first as usual with your normal shampoo.  3. After you shampoo, rinse your hair and body thoroughly to remove the shampoo.  4. Use CHG as you would any other liquid soap. You can apply CHG directly to the skin and wash gently with a scrungie or a clean washcloth.   5. Apply the CHG Soap to your body ONLY FROM THE NECK DOWN.  Do not use on open wounds or open sores. Avoid contact with your eyes, ears, mouth and genitals (private parts). Wash Face and genitals (private parts)  with your normal soap.   6. Wash thoroughly, paying special attention to the area where your surgery will be  performed.  7. Thoroughly rinse your body with warm water from the neck down.  8. DO NOT shower/wash with your normal soap after using and rinsing off the CHG Soap.  9. Pat yourself dry with a CLEAN TOWEL.  10. Wear CLEAN PAJAMAS to bed the night before surgery  11. Place CLEAN SHEETS on your bed the night of your first shower and DO NOT SLEEP WITH PETS.   Day of Surgery: Wear Clean/Comfortable clothing the morning of surgery Do not apply any deodorants/lotions.   Remember to brush your teeth WITH YOUR REGULAR TOOTHPASTE.   Please read over the following fact sheets that you were given.

## 2020-06-24 ENCOUNTER — Inpatient Hospital Stay (HOSPITAL_COMMUNITY)
Admission: RE | Admit: 2020-06-24 | Discharge: 2020-06-24 | Disposition: A | Discharge status: Home / Self Care | Payer: Medicare HMO | Source: Ambulatory Visit

## 2020-06-24 DIAGNOSIS — G894 Chronic pain syndrome: Secondary | ICD-10-CM | POA: Diagnosis not present

## 2020-06-24 DIAGNOSIS — M533 Sacrococcygeal disorders, not elsewhere classified: Secondary | ICD-10-CM | POA: Diagnosis not present

## 2020-06-24 DIAGNOSIS — M47816 Spondylosis without myelopathy or radiculopathy, lumbar region: Secondary | ICD-10-CM | POA: Diagnosis not present

## 2020-06-25 ENCOUNTER — Other Ambulatory Visit (HOSPITAL_COMMUNITY): Payer: Medicare HMO

## 2020-06-25 ENCOUNTER — Other Ambulatory Visit: Payer: Self-pay

## 2020-06-25 DIAGNOSIS — R6889 Other general symptoms and signs: Secondary | ICD-10-CM | POA: Diagnosis not present

## 2020-06-30 NOTE — Telephone Encounter (Signed)
Was approved and sent to scan

## 2020-07-01 ENCOUNTER — Ambulatory Visit (INDEPENDENT_AMBULATORY_CARE_PROVIDER_SITE_OTHER): Payer: Medicare HMO | Admitting: Psychology

## 2020-07-01 ENCOUNTER — Ambulatory Visit: Payer: Medicare HMO | Admitting: Psychology

## 2020-07-01 DIAGNOSIS — F332 Major depressive disorder, recurrent severe without psychotic features: Secondary | ICD-10-CM | POA: Diagnosis not present

## 2020-07-02 ENCOUNTER — Telehealth: Payer: Self-pay

## 2020-07-02 ENCOUNTER — Other Ambulatory Visit: Payer: Self-pay

## 2020-07-02 NOTE — Telephone Encounter (Signed)
Prior Authorization approved until 05/21/21 for testosterone

## 2020-07-03 ENCOUNTER — Other Ambulatory Visit: Payer: Self-pay | Admitting: Gastroenterology

## 2020-07-05 ENCOUNTER — Other Ambulatory Visit: Payer: Self-pay

## 2020-07-05 ENCOUNTER — Encounter: Payer: Self-pay | Admitting: Internal Medicine

## 2020-07-05 ENCOUNTER — Ambulatory Visit (INDEPENDENT_AMBULATORY_CARE_PROVIDER_SITE_OTHER): Payer: Medicare HMO | Admitting: Internal Medicine

## 2020-07-05 VITALS — BP 140/78 | HR 63 | Temp 98.5°F | Ht 73.0 in | Wt 315.0 lb

## 2020-07-05 DIAGNOSIS — J339 Nasal polyp, unspecified: Secondary | ICD-10-CM

## 2020-07-05 DIAGNOSIS — J329 Chronic sinusitis, unspecified: Secondary | ICD-10-CM

## 2020-07-05 DIAGNOSIS — I10 Essential (primary) hypertension: Secondary | ICD-10-CM | POA: Diagnosis not present

## 2020-07-05 DIAGNOSIS — J309 Allergic rhinitis, unspecified: Secondary | ICD-10-CM

## 2020-07-05 DIAGNOSIS — R6889 Other general symptoms and signs: Secondary | ICD-10-CM | POA: Diagnosis not present

## 2020-07-05 DIAGNOSIS — E7800 Pure hypercholesterolemia, unspecified: Secondary | ICD-10-CM

## 2020-07-05 MED ORDER — AZELASTINE-FLUTICASONE 137-50 MCG/ACT NA SUSP: NASAL | 11 refills | Status: DC

## 2020-07-05 MED ORDER — PREDNISONE 10 MG PO TABS: ORAL_TABLET | ORAL | 0 refills | Status: DC

## 2020-07-05 NOTE — Assessment & Plan Note (Signed)
BP Readings from Last 3 Encounters:  07/05/20 140/78  06/17/20 (!) 212/37  06/16/20 130/74   Stable, pt to continue medical treatment hct - advised to stop the afrin

## 2020-07-05 NOTE — Assessment & Plan Note (Signed)
Lab Results  Component Value Date   LDLCALC 64 02/24/2020   Stable, pt to continue current statin lipitor 20  Current Outpatient Medications (Endocrine & Metabolic):  .  predniSONE (DELTASONE) 10 MG tablet, 3 tabs by mouth per day for 3 days,2tabs per day for 3 days,1tab per day for 3 days .  Testosterone 1.62 % GEL, Place 1 Pump onto the skin daily.  Current Outpatient Medications (Cardiovascular):  .  atorvastatin (LIPITOR) 20 MG tablet, Take 1 tablet (20 mg total) by mouth daily. (Patient taking differently: Take 20 mg by mouth daily.) .  hydrochlorothiazide (HYDRODIURIL) 25 MG tablet, Take 1 tablet (25 mg total) by mouth daily. (Patient taking differently: Take 25 mg by mouth daily.) .  sildenafil (VIAGRA) 100 MG tablet, Take 100 mg by mouth daily as needed for erectile dysfunction.   Current Outpatient Medications (Respiratory):  [name]  Azelastine-Fluticasone 137-50 MCG/ACT SUSP, Use both nostrils as directed twice daily .  albuterol (VENTOLIN HFA) 108 (90 Base) MCG/ACT inhaler, Inhale 2 puffs into the lungs every 6 (six) hours as needed for wheezing or shortness of breath. [name]  oxymetazoline (AFRIN) 0.05 % nasal spray, Place 1 spray into both nostrils 2 (two) times daily as needed for congestion.  Current Outpatient Medications (Analgesics):  .  aspirin 81 MG chewable tablet, Chew 1 tablet (81 mg total) by mouth daily. .  celecoxib (CELEBREX) 200 MG capsule, Take 1 capsule (200 mg total) by mouth 2 (two) times daily. (Patient taking differently: Take 200 mg by mouth 2 (two) times daily.) .  HYDROcodone-acetaminophen (NORCO) 10-325 MG tablet, Take 1 tablet by mouth every 8 (eight) hours as needed for moderate pain.   Current Outpatient Medications (Hematological):  .  iron polysaccharides (NU-IRON) 150 MG capsule, Take 1 capsule (150 mg total) by mouth daily. .  prasugrel (EFFIENT) 5 MG TABS tablet, Take 1 tablet (5 mg total) by mouth daily.  Current Outpatient Medications (Other):  .   amoxicillin (AMOXIL) 500 MG capsule, Take 2,000 mg by mouth daily as needed. AS NEEDED FOR DENTAL WORK .  ARIPiprazole (ABILIFY) 10 MG tablet, Take 1 tablet (10 mg total) by mouth daily. .  benztropine (COGENTIN) 0.5 MG tablet, Take 1 tablet (0.5 mg total) by mouth at bedtime. .  calcium carbonate (TUMS - DOSED IN MG ELEMENTAL CALCIUM) 500 MG chewable tablet, Chew 1,000 mg by mouth at bedtime. .  Calcium Polycarbophil (FIBER-CAPS PO), Take 3 capsules by mouth at bedtime. .  DULoxetine (CYMBALTA) 30 MG capsule, Take 1 capsule (30 mg total) by mouth 2 (two) times daily. [name]  gabapentin (NEURONTIN) 800 MG tablet, Take 800 mg by mouth 3 (three) times daily.  .  Melatonin 5 MG TABS, Take 20 mg by mouth at bedtime.  .  methocarbamol (ROBAXIN) 500 MG tablet, Take 1 tablet (500 mg total) by mouth every 8 (eight) hours as needed for muscle spasms. .  Multiple Vitamins-Minerals (BARIATRIC MULTIVITAMINS/IRON PO), Take 1 tablet by mouth daily. [name]  MYRBETRIQ 50 MG TB24 tablet, Take 50 mg by mouth at bedtime.  .  Omega-3 Fatty Acids (FISH OIL) 1000 MG CAPS, Take 1,000 mg by mouth at bedtime.  [name]  oxybutynin (DITROPAN-XL) 10 MG 24 hr tablet, Take 10 mg by mouth daily. .  pantoprazole (PROTONIX) 40 MG tablet, Take 1 tablet (40 mg total) by mouth 2 (two) times daily. (Patient taking differently: Take 40 mg by mouth 2 (two) times daily before a meal.) .  phentermine 37.5 MG capsule, Take 1 capsule (  37.5 mg total) by mouth every morning. (Patient not taking: No sig reported) .  traZODone (DESYREL) 150 MG tablet, Take 1 tablet (150 mg total) by mouth at bedtime. [name]  ZENPEP 40000-126000 units CPEP, Take 2 capsules (80,000 units) with meals and 1 capsule (40,000 units) with each snack .  zolpidem (AMBIEN) 10 MG tablet, Take 10 mg by mouth at bedtime as needed for sleep.

## 2020-07-05 NOTE — Assessment & Plan Note (Signed)
Mild to mod, for predpac asd, dymista asd, and refer ENT at [facility] as requested,  to f/u any worsening symptoms or concerns

## 2020-07-05 NOTE — Progress Notes (Signed)
Patient ID: Travis Dyer., male   DOB: 03-27-1951, 70 y.o.   MRN: [id number]        Chief Complaint: hard to manage saliva       HPI:  Travis Macaraeg. is a 70 y.o. male here with c/o above, but has no difficulty with solids and liquids with swallowing when taking by mouth; Does have several wks ongoing nasal allergy symptoms with clearish congestion, itch and sneezing, without fever, pain, ST, cough, swelling or wheezing but has occasional choking on the fluid.  Admits to taking afrin 4 times per day with no plans to stop.  Has chronic sinus with polyps and needs referral to [facility] as he missed his referral appt to local ENT, even though he had previous words on the phone with [facility]    .        Wt Readings from Last 3 Encounters:  07/05/20 (!) 315 lb (142.9 kg)  06/17/20 (!) 313 lb (142 kg)  06/16/20 (!) 313 lb (142 kg)   BP Readings from Last 3 Encounters:  07/05/20 140/78  06/17/20 (!) 212/37  06/16/20 130/74         Past Medical History:  Diagnosis Date  . ALLERGIC RHINITIS 06/24/2009  . Allergy   . Anemia    IDA  . Anxiety 11/12/2011   Adequate for discharge   . Arthritis    "all my joints" (09/30/2013)  . Arthritis of foot, right, degenerative 04/15/2014  . Balance disorder 03/12/2013  . Benign neoplasm of cecum   . Benign neoplasm of descending colon   . CAD (coronary artery disease) 06/24/2009   5 stents placed in 2007    . Chronic anticoagulation   . Chronic pain syndrome 10/27/2009   of ankle, shoulders, low back.  sciatica.   . Closed fracture of right foot 10/17/2014  . CORONARY ARTERY DISEASE 06/24/2009   a. s/p multiple PCIs - In 2008 he had a Taxus DES to the mild LAD, Endeavor DES to mid LCX and distal LCX. In January 2009 he had DES to distal LCX, mid LCX and proximal LCX. In November 2009 had BMS x 2 to the mid RCA. Cath 10/2011 with patent stents, noncardiac CP. LHC 01/2013: patent stents (noncardiac CP).  . DEGENERATIVE JOINT DISEASE 06/24/2009   Qualifier:  Diagnosis of  By: [name], [name]   . Depression   . Depression with anxiety    Prior suicide attempt(08/25/19-pt states not suicide attempt)  . [place] DISEASE, LUMBAR 04/19/2010  . ERECTILE DYSFUNCTION, ORGANIC 05/30/2010  . Essential hypertension 06/24/2009   Qualifier: Diagnosis of  By: [name], [name]   . Fibromyalgia   . Fracture dislocation of ankle joint 09/02/2015  . Gait disorder 03/12/2013  . General weakness 07/14/2014  . GERD (gastroesophageal reflux disease) 09/08/2015   08/25/15-pt states was cardiac origin, not GERD  . Hand joint pain 06/10/2013  . Heart murmur   . Hepatitis C    treated pt. unknown with what he was a teenager  . History of kidney stones   . Hyperlipidemia 07/15/2009   Qualifier: Diagnosis of  By: Aundra Dubin, MD, [place]    . HYPERLIPIDEMIA-MIXED 07/15/2009   08/25/19-pt state cholesterol was normal range  . HYPERTENSION 06/24/2009  . Insomnia 10/04/2011  . Irregular heart beat   . Left hip pain 03/12/2013   Injected under ultrasound guidance on June 24, 2013   . Major depression 09/13/2015  . Myocardial infarction ([place]) 2008  .  Non-cardiac chest pain 10/2011, 01/2013  . Obesity   . Occult blood positive stool 10/17/2014  . Open ankle fracture 09/02/2015  . OSA (obstructive sleep apnea)    not using CPAP (09/30/2013)  . Pain of right thumb 04/03/2013  . Pneumonia   . PPD positive 04/08/2015  . Pre-ulcerative corn or callous 02/06/2013  . Rotator cuff tear arthropathy of both shoulders 06/10/2013   History of bilateral shoulder cuff surgery for rotator cuff tears. Reports increase in pain 09/11/2015 during physical therapy of the left shoulder.   [name] SCIATICA, LEFT 04/19/2010   Qualifier: Diagnosis of  By: [name], [name]   . Sleep apnea    wears cpap 08/25/19-States not using due to nasal stuffiness.  [name] Spinal stenosis in cervical region 09/26/2013  . Spinal stenosis, lumbar region, with neurogenic claudication 09/26/2013  . Type II diabetes mellitus ([place]) 2012   no meds  in 09/2014.   [name] Uncontrolled type 2 DM with peripheral circulatory disorder ([place]) 10/04/2013   08/25/19- pt states A1C normal, no diabetes  . URETHRAL STRICTURE 06/24/2009   self catheterizes.    Past Surgical History:  Procedure Laterality Date  . AMPUTATION Right 06/14/2016   Procedure: AMPUTATION BELOW KNEE;  Surgeon: Newt Minion, MD;  Location: [place];  Service: Orthopedics;  Laterality: Right;  . ANKLE FUSION Right 04/15/2014   Procedure: Right Subtalar, Talonavicular Fusion;  Surgeon: Newt Minion, MD;  Location: [place];  Service: Orthopedics;  Laterality: Right;  . ANKLE FUSION Right 04/18/2016   Procedure: Right Ankle Tibiocalcaneal Fusion;  Surgeon: Newt Minion, MD;  Location: [place];  Service: Orthopedics;  Laterality: Right;  . ANTERIOR CERVICAL DECOMP/DISCECTOMY FUSION N/A 09/26/2013   Procedure: ANTERIOR CERVICAL DISCECTOMY FUSION C3-4, plate and screw fixation, allograft bone graft;  Surgeon: Jessy Oto, MD;  Location: [place];  Service: Orthopedics;  Laterality: N/A;  . BACK SURGERY     3  . BELOW KNEE LEG AMPUTATION Right 06/14/2016   right ankle and foot  . CARDIAC CATHETERIZATION  X 1  . CARPAL TUNNEL RELEASE Bilateral   . COLONOSCOPY N/A 10/22/2014   Procedure: COLONOSCOPY;  Surgeon: Lafayette Dragon, MD;  Location: [facility] ENDOSCOPY;  Service: Endoscopy;  Laterality: N/A;  . COLONOSCOPY  11/19/2018  . CORONARY ANGIOPLASTY WITH STENT PLACEMENT     "I have 9 stents"  . ESOPHAGOGASTRODUODENOSCOPY N/A 10/19/2014   Procedure: ESOPHAGOGASTRODUODENOSCOPY (EGD);  Surgeon: Jerene Bears, MD;  Location: [facility] ENDOSCOPY;  Service: Endoscopy;  Laterality: N/A;  . FRACTURE SURGERY    . FUSION OF TALONAVICULAR JOINT Right 04/15/2014   dr duda  . GASTRIC BYPASS  02/20/2019  . HERNIA REPAIR     umbilical  . INGUINAL HERNIA REPAIR Right 05/11/2015   Procedure: LAPAROSCOPIC REPAIR RIGHT  INGUINAL HERNIA;  Surgeon: Greer Pickerel, MD;  Location: [place];  Service: General;  Laterality: Right;  .  INSERTION OF MESH Right 05/11/2015   Procedure: INSERTION OF MESH;  Surgeon: Greer Pickerel, MD;  Location: [place];  Service: General;  Laterality: Right;  . JOINT REPLACEMENT    . KNEE CARTILAGE SURGERY Right X 12   "~ 1/2 open; ~ 1/2 scopes"  . KNEE CARTILAGE SURGERY Left X 3   "3 scopes"  . LAPAROSCOPIC GASTRIC SLEEVE RESECTION N/A 03/03/2019   Procedure: LAPAROSCOPIC GASTRIC SLEEVE RESECTION, Upper Endo, ERAS Pathway;  Surgeon: Greer Pickerel, MD;  Location: [facility];  Service: General;  Laterality: N/A;  . LEFT HEART CATHETERIZATION WITH CORONARY  ANGIOGRAM N/A 02/10/2013   Procedure: LEFT HEART CATHETERIZATION WITH CORONARY ANGIOGRAM;  Surgeon: Burnell Blanks, MD;  Location[facility] C[facility] LAB;  Service: Cardiovascular;  Laterality: N/A;  . LUMBAR LAMINECTOMY N/A 07/16/2018   Procedure: LEFT L4-5 REDO PARTIAL LUMBAR HEMILAMINECTOMY WITH FORAMINOTOMY LEFT L4;  Surgeon: Jessy Oto, MD;  Location: [place];  Service: Orthopedics;  Laterality: N/A;  . LUMBAR LAMINECTOMY/DECOMPRESSION MICRODISCECTOMY N/A 01/27/2014   Procedure: CENTRAL LUMBAR LAMINECTOMY L4-5 AND L3-4;  Surgeon: Jessy Oto, MD;  Location: [place];  Service: Orthopedics;  Laterality: N/A;  . ORIF ANKLE FRACTURE Right 09/02/2015   Procedure: OPEN REDUCTION INTERNAL FIXATION (ORIF) ANKLE FRACTURE;  Surgeon: Newt Minion, MD;  Location: [place];  Service: Orthopedics;  Laterality: Right;  . PERIPHERALLY INSERTED CENTRAL CATHETER INSERTION  09/02/2015  . POLYPECTOMY    . ROTATOR CUFF REPAIR Left    x 2  . ROTATOR CUFF REPAIR Right    x 3  . SHOULDER ARTHROSCOPY W/ ROTATOR CUFF REPAIR Bilateral    "3 on the right; 1 on the left"  . SKIN SPLIT GRAFT Right 10/01/2015   Procedure: RIGHT ANKLE APPLY SKIN GRAFT SPLIT THICKNESS;  Surgeon: Newt Minion, MD;  Location: [place];  Service: Orthopedics;  Laterality: Right;  . TONSILLECTOMY    . TOOTH EXTRACTION    . TOTAL HIP ARTHROPLASTY Right 04/17/2018  . TOTAL HIP ARTHROPLASTY Right 04/17/2018    Procedure: RIGHT TOTAL HIP ARTHROPLASTY;  Surgeon: Newt Minion, MD;  Location: [place];  Service: Orthopedics;  Laterality: Right;  . TOTAL HIP ARTHROPLASTY Left 06/06/2019   Procedure: LEFT TOTAL HIP ARTHROPLASTY ANTERIOR APPROACH;  Surgeon: Mcarthur Rossetti, MD;  Location: [place];  Service: Orthopedics;  Laterality: Left;  . TOTAL HIP REVISION Left 05/24/2019  . TOTAL KNEE ARTHROPLASTY Bilateral 2008  . UMBILICAL HERNIA REPAIR     UHR  . UPPER GASTROINTESTINAL ENDOSCOPY  2016  . URETHRAL DILATION  X 4  . VASECTOMY    . WISDOM TOOTH EXTRACTION      reports that he quit smoking about 9 years ago. His smoking use included cigars. He has never used smokeless tobacco. He reports previous alcohol use. He reports that he does not use drugs. family history includes Breast cancer in his mother; Colon polyps in his brother; Coronary artery disease in an other family member; Depression in his brother, mother, and another family member; Diabetes in his father; Early death in his maternal grandfather, paternal grandfather, and son; Esophageal cancer in his mother; Healthy in his son; Heart attack (age of onset: 38) in his son; Heart attack (age of onset: 5) in his maternal grandfather; Heart disease in his father, maternal grandfather, and mother; Hyperlipidemia in his father; Hypertension in his brother, father, mother, and another family member; Pancreatic cancer in his mother; Prostate cancer in his father; Skin cancer in his father; Stomach cancer in his mother. No Known Allergies Current Outpatient Medications on File Prior to Visit  Medication Sig Dispense Refill  . albuterol (VENTOLIN HFA) 108 (90 Base) MCG/ACT inhaler Inhale 2 puffs into the lungs every 6 (six) hours as needed for wheezing or shortness of breath. 18 g 1  . amoxicillin (AMOXIL) 500 MG capsule Take 2,000 mg by mouth daily as needed. AS NEEDED FOR DENTAL WORK    . ARIPiprazole (ABILIFY) 10 MG tablet Take 1 tablet (10 mg total)  by mouth daily. 90 tablet 0  . aspirin 81 MG chewable tablet Chew 1 tablet (81  mg total) by mouth daily. 35 tablet 0  . atorvastatin (LIPITOR) 20 MG tablet Take 1 tablet (20 mg total) by mouth daily. (Patient taking differently: Take 20 mg by mouth daily.) 90 tablet 0  . benztropine (COGENTIN) 0.5 MG tablet Take 1 tablet (0.5 mg total) by mouth at bedtime. 90 tablet 0  . calcium carbonate (TUMS - DOSED IN MG ELEMENTAL CALCIUM) 500 MG chewable tablet Chew 1,000 mg by mouth at bedtime.    . Calcium Polycarbophil (FIBER-CAPS PO) Take 3 capsules by mouth at bedtime.    . celecoxib (CELEBREX) 200 MG capsule Take 1 capsule (200 mg total) by mouth 2 (two) times daily. (Patient taking differently: Take 200 mg by mouth 2 (two) times daily.) 180 capsule 3  . DULoxetine (CYMBALTA) 30 MG capsule Take 1 capsule (30 mg total) by mouth 2 (two) times daily. 180 capsule 0  . gabapentin (NEURONTIN) 800 MG tablet Take 800 mg by mouth 3 (three) times daily.     . hydrochlorothiazide (HYDRODIURIL) 25 MG tablet Take 1 tablet (25 mg total) by mouth daily. (Patient taking differently: Take 25 mg by mouth daily.) 90 tablet 3  . HYDROcodone-acetaminophen (NORCO) 10-325 MG tablet Take 1 tablet by mouth every 8 (eight) hours as needed for moderate pain.     . iron polysaccharides (NU-IRON) 150 MG capsule Take 1 capsule (150 mg total) by mouth daily. 90 capsule 1  . Melatonin 5 MG TABS Take 20 mg by mouth at bedtime.     . methocarbamol (ROBAXIN) 500 MG tablet Take 1 tablet (500 mg total) by mouth every 8 (eight) hours as needed for muscle spasms. 90 tablet 1  . Multiple Vitamins-Minerals (BARIATRIC MULTIVITAMINS/IRON PO) Take 1 tablet by mouth daily.    [name] MYRBETRIQ 50 MG TB24 tablet Take 50 mg by mouth at bedtime.     . Omega-3 Fatty Acids (FISH OIL) 1000 MG CAPS Take 1,000 mg by mouth at bedtime.     [name] oxybutynin (DITROPAN-XL) 10 MG 24 hr tablet Take 10 mg by mouth daily.    [name] oxymetazoline (AFRIN) 0.05 % nasal spray Place 1  spray into both nostrils 2 (two) times daily as needed for congestion.    . pantoprazole (PROTONIX) 40 MG tablet Take 1 tablet (40 mg total) by mouth 2 (two) times daily. (Patient taking differently: Take 40 mg by mouth 2 (two) times daily before a meal.) 60 tablet 2  . phentermine 37.5 MG capsule Take 1 capsule (37.5 mg total) by mouth every morning. (Patient not taking: No sig reported) 30 capsule 2  . prasugrel (EFFIENT) 5 MG TABS tablet Take 1 tablet (5 mg total) by mouth daily. 90 tablet 3  . sildenafil (VIAGRA) 100 MG tablet Take 100 mg by mouth daily as needed for erectile dysfunction.     . Testosterone 1.62 % GEL Place 1 Pump onto the skin daily. 88 g 1  . traZODone (DESYREL) 150 MG tablet Take 1 tablet (150 mg total) by mouth at bedtime. 90 tablet 0  . ZENPEP 40000-126000 units CPEP Take 2 capsules (80,000 units) with meals and 1 capsule (40,000 units) with each snack 720 capsule 1  . zolpidem (AMBIEN) 10 MG tablet Take 10 mg by mouth at bedtime as needed for sleep.     No current facility-administered medications on file prior to visit.        ROS:  All others reviewed and negative.  Objective        PE:  BP 140/78  Pulse 63   Temp 98.5 F (36.9 C) (Oral)   Ht 6' 1"  (1.854 m)   Wt (!) 315 lb (142.9 kg)   SpO2 95%   BMI 41.56 kg/m                 Constitutional: Pt appears in NAD               HENT: Head: NCAT.                Right Ear: External ear normal.                 Left Ear: External ear normal.                Eyes: . Pupils are equal, round, and reactive to light. Conjunctivae and EOM are normal               Nose: without d/c or deformity               Neck: Neck supple. Gross normal ROM               Cardiovascular: Normal rate and regular rhythm.                 Pulmonary/Chest: Effort normal and breath sounds without rales or wheezing.                Abd:  Soft, NT, ND, + BS, no organomegaly               Neurological: Pt is alert. At baseline orientation,  motor grossly intact               Skin: Skin is warm. No rashes, no other new lesions, LE edema - trace bilateral               Psychiatric: Pt behavior is normal without agitation   Micro: none  Cardiac tracings I have personally interpreted today:  none  Pertinent Radiological findings (summarize): none   Lab Results  Component Value Date   WBC 5.3 02/24/2020   HGB 14.0 02/24/2020   HCT 40.7 02/24/2020   PLT 250.0 02/24/2020   GLUCOSE 106 (H) 02/24/2020   CHOL 134 02/24/2020   TRIG 53.0 02/24/2020   HDL 59.30 02/24/2020   LDLCALC 64 02/24/2020   ALT 15 02/24/2020   AST 15 02/24/2020   NA 137 02/24/2020   K 4.4 02/24/2020   CL 103 02/24/2020   CREATININE 1.17 02/24/2020   BUN 27 (H) 02/24/2020   CO2 26 02/24/2020   TSH 2.52 02/24/2020   PSA 0.56 07/24/2019   INR 1.1 06/03/2019   HGBA1C 5.7 (A) 02/24/2020   MICROALBUR <0.7 07/24/2019   Assessment/Plan:  Travis Novak Sr. is a 70 y.o. White or Caucasian [1] male with  has a past medical history of ALLERGIC RHINITIS (06/24/2009), Allergy, Anemia, Anxiety (11/12/2011), Arthritis, Arthritis of foot, right, degenerative (04/15/2014), Balance disorder (03/12/2013), Benign neoplasm of cecum, Benign neoplasm of descending colon, CAD (coronary artery disease) (06/24/2009), Chronic anticoagulation, Chronic pain syndrome (10/27/2009), Closed fracture of right foot (10/17/2014), CORONARY ARTERY DISEASE (06/24/2009), DEGENERATIVE JOINT DISEASE (06/24/2009), Depression, Depression with anxiety, DISC DISEASE, LUMBAR (04/19/2010), ERECTILE DYSFUNCTION, ORGANIC (05/30/2010), Essential hypertension (06/24/2009), Fibromyalgia, Fracture dislocation of ankle joint (09/02/2015), Gait disorder (03/12/2013), General weakness (07/14/2014), GERD (gastroesophageal reflux disease) (09/08/2015), Hand joint pain (06/10/2013), Heart murmur, Hepatitis C, History of kidney stones, Hyperlipidemia (07/15/2009), HYPERLIPIDEMIA-MIXED (07/15/2009), HYPERTENSION (06/24/2009), Insomnia  (10/04/2011), Irregular heart  beat, Left hip pain (03/12/2013), Major depression (09/13/2015), Myocardial infarction [facility]) (2008), Non-cardiac chest pain (10/2011, 01/2013), Obesity, Occult blood positive stool (10/17/2014), Open ankle fracture (09/02/2015), OSA (obstructive sleep apnea), Pain of right thumb (04/03/2013), Pneumonia, PPD positive (04/08/2015), Pre-ulcerative corn or callous (02/06/2013), Rotator cuff tear arthropathy of both shoulders (06/10/2013), SCIATICA, LEFT (04/19/2010), Sleep apnea, Spinal stenosis in cervical region (09/26/2013), Spinal stenosis, lumbar region, with neurogenic claudication (09/26/2013), Type II diabetes mellitus ([place]) (2012), Uncontrolled type 2 DM with peripheral circulatory disorder ([place]) (10/04/2013), and URETHRAL STRICTURE (06/24/2009).  Allergic rhinitis Mild to mod, for predpac asd, dymista asd, and refer ENT at [facility] as requested,  to f/u any worsening symptoms or concerns  Essential hypertension BP Readings from Last 3 Encounters:  07/05/20 140/78  06/17/20 (!) 212/37  06/16/20 130/74   Stable, pt to continue medical treatment hct - advised to stop the afrin   Hyperlipidemia Lab Results  Component Value Date   LDLCALC 64 02/24/2020   Stable, pt to continue current statin lipitor 20  Current Outpatient Medications (Endocrine & Metabolic):  .  predniSONE (DELTASONE) 10 MG tablet, 3 tabs by mouth per day for 3 days,2tabs per day for 3 days,1tab per day for 3 days .  Testosterone 1.62 % GEL, Place 1 Pump onto the skin daily.  Current Outpatient Medications (Cardiovascular):  .  atorvastatin (LIPITOR) 20 MG tablet, Take 1 tablet (20 mg total) by mouth daily. (Patient taking differently: Take 20 mg by mouth daily.) .  hydrochlorothiazide (HYDRODIURIL) 25 MG tablet, Take 1 tablet (25 mg total) by mouth daily. (Patient taking differently: Take 25 mg by mouth daily.) .  sildenafil (VIAGRA) 100 MG tablet, Take 100 mg by mouth daily as needed for erectile dysfunction.    Current Outpatient Medications (Respiratory):  [name]  Azelastine-Fluticasone 137-50 MCG/ACT SUSP, Use both nostrils as directed twice daily .  albuterol (VENTOLIN HFA) 108 (90 Base) MCG/ACT inhaler, Inhale 2 puffs into the lungs every 6 (six) hours as needed for wheezing or shortness of breath. [name]  oxymetazoline (AFRIN) 0.05 % nasal spray, Place 1 spray into both nostrils 2 (two) times daily as needed for congestion.  Current Outpatient Medications (Analgesics):  .  aspirin 81 MG chewable tablet, Chew 1 tablet (81 mg total) by mouth daily. .  celecoxib (CELEBREX) 200 MG capsule, Take 1 capsule (200 mg total) by mouth 2 (two) times daily. (Patient taking differently: Take 200 mg by mouth 2 (two) times daily.) .  HYDROcodone-acetaminophen (NORCO) 10-325 MG tablet, Take 1 tablet by mouth every 8 (eight) hours as needed for moderate pain.   Current Outpatient Medications (Hematological):  .  iron polysaccharides (NU-IRON) 150 MG capsule, Take 1 capsule (150 mg total) by mouth daily. .  prasugrel (EFFIENT) 5 MG TABS tablet, Take 1 tablet (5 mg total) by mouth daily.  Current Outpatient Medications (Other):  .  amoxicillin (AMOXIL) 500 MG capsule, Take 2,000 mg by mouth daily as needed. AS NEEDED FOR DENTAL WORK .  ARIPiprazole (ABILIFY) 10 MG tablet, Take 1 tablet (10 mg total) by mouth daily. .  benztropine (COGENTIN) 0.5 MG tablet, Take 1 tablet (0.5 mg total) by mouth at bedtime. .  calcium carbonate (TUMS - DOSED IN MG ELEMENTAL CALCIUM) 500 MG chewable tablet, Chew 1,000 mg by mouth at bedtime. .  Calcium Polycarbophil (FIBER-CAPS PO), Take 3 capsules by mouth at bedtime. .  DULoxetine (CYMBALTA) 30 MG capsule, Take 1 capsule (30 mg total) by mouth 2 (two) times daily. [name]  gabapentin (NEURONTIN)  800 MG tablet, Take 800 mg by mouth 3 (three) times daily.  .  Melatonin 5 MG TABS, Take 20 mg by mouth at bedtime.  .  methocarbamol (ROBAXIN) 500 MG tablet, Take 1 tablet (500 mg total) by mouth every  8 (eight) hours as needed for muscle spasms. .  Multiple Vitamins-Minerals (BARIATRIC MULTIVITAMINS/IRON PO), Take 1 tablet by mouth daily. [name]  MYRBETRIQ 50 MG TB24 tablet, Take 50 mg by mouth at bedtime.  .  Omega-3 Fatty Acids (FISH OIL) 1000 MG CAPS, Take 1,000 mg by mouth at bedtime.  [name]  oxybutynin (DITROPAN-XL) 10 MG 24 hr tablet, Take 10 mg by mouth daily. .  pantoprazole (PROTONIX) 40 MG tablet, Take 1 tablet (40 mg total) by mouth 2 (two) times daily. (Patient taking differently: Take 40 mg by mouth 2 (two) times daily before a meal.) .  phentermine 37.5 MG capsule, Take 1 capsule (37.5 mg total) by mouth every morning. (Patient not taking: No sig reported) .  traZODone (DESYREL) 150 MG tablet, Take 1 tablet (150 mg total) by mouth at bedtime. [name]  ZENPEP 40000-126000 units CPEP, Take 2 capsules (80,000 units) with meals and 1 capsule (40,000 units) with each snack .  zolpidem (AMBIEN) 10 MG tablet, Take 10 mg by mouth at bedtime as needed for sleep.   Followup: Return if symptoms worsen or fail to improve.  [name], MD 07/05/2020 9:12 PM [facility]

## 2020-07-05 NOTE — Patient Instructions (Signed)
Please take all new medication as prescribed - the prednisone and dymista for chronic sinusitis  You will be contacted regarding the referral for: ENT - [facility]  Please continue all other medications as before, and refills have been done if requested.  Please have the pharmacy call with any other refills you may need.  Please continue your efforts at being more active, low cholesterol diet, and weight control.  Please keep your appointments with your specialists as you may have planned

## 2020-07-07 ENCOUNTER — Telehealth: Payer: Self-pay | Admitting: Internal Medicine

## 2020-07-07 DIAGNOSIS — E669 Obesity, unspecified: Secondary | ICD-10-CM

## 2020-07-07 DIAGNOSIS — E1169 Type 2 diabetes mellitus with other specified complication: Secondary | ICD-10-CM

## 2020-07-07 NOTE — Telephone Encounter (Signed)
   Patient requesting a new referral to a nutritionist He states he was seeing a [employer] information [facility] manager on [address], but cant remember the name

## 2020-07-07 NOTE — Telephone Encounter (Signed)
Ok referral done 

## 2020-07-08 DIAGNOSIS — G894 Chronic pain syndrome: Secondary | ICD-10-CM | POA: Diagnosis not present

## 2020-07-08 DIAGNOSIS — R6889 Other general symptoms and signs: Secondary | ICD-10-CM | POA: Diagnosis not present

## 2020-07-08 DIAGNOSIS — M533 Sacrococcygeal disorders, not elsewhere classified: Secondary | ICD-10-CM | POA: Diagnosis not present

## 2020-07-08 DIAGNOSIS — M961 Postlaminectomy syndrome, not elsewhere classified: Secondary | ICD-10-CM | POA: Diagnosis not present

## 2020-07-08 NOTE — Telephone Encounter (Signed)
Sent my-chart message to patient regarding referral.

## 2020-07-10 ENCOUNTER — Other Ambulatory Visit: Payer: Self-pay | Admitting: Internal Medicine

## 2020-07-10 NOTE — Telephone Encounter (Signed)
Please refill as per office routine med refill policy (all routine meds refilled for 3 mo or monthly per pt preference up to one year from last visit, then month to month grace period for 3 mo, then further med refills will have to be denied)  

## 2020-07-13 ENCOUNTER — Ambulatory Visit (INDEPENDENT_AMBULATORY_CARE_PROVIDER_SITE_OTHER): Payer: Self-pay | Admitting: Otolaryngology

## 2020-07-15 ENCOUNTER — Encounter: Payer: Self-pay | Admitting: Specialist

## 2020-07-15 ENCOUNTER — Ambulatory Visit (INDEPENDENT_AMBULATORY_CARE_PROVIDER_SITE_OTHER): Payer: Medicare HMO | Admitting: Psychology

## 2020-07-15 ENCOUNTER — Ambulatory Visit (INDEPENDENT_AMBULATORY_CARE_PROVIDER_SITE_OTHER): Payer: Medicare HMO | Admitting: Specialist

## 2020-07-15 VITALS — BP 157/78 | HR 99 | Ht 73.0 in | Wt 312.1 lb

## 2020-07-15 DIAGNOSIS — M4316 Spondylolisthesis, lumbar region: Secondary | ICD-10-CM | POA: Diagnosis not present

## 2020-07-15 DIAGNOSIS — F331 Major depressive disorder, recurrent, moderate: Secondary | ICD-10-CM

## 2020-07-15 DIAGNOSIS — M47816 Spondylosis without myelopathy or radiculopathy, lumbar region: Secondary | ICD-10-CM | POA: Diagnosis not present

## 2020-07-15 DIAGNOSIS — M48062 Spinal stenosis, lumbar region with neurogenic claudication: Secondary | ICD-10-CM | POA: Diagnosis not present

## 2020-07-15 DIAGNOSIS — M5137 Other intervertebral disc degeneration, lumbosacral region: Secondary | ICD-10-CM | POA: Diagnosis not present

## 2020-07-15 DIAGNOSIS — M2170 Unequal limb length (acquired), unspecified site: Secondary | ICD-10-CM | POA: Diagnosis not present

## 2020-07-15 NOTE — Progress Notes (Signed)
Office Visit Note   Patient: Travis SAGE Sr.           Date of Birth: 08-30-1950           MRN: [id number] Visit Date: 07/15/2020              Requested by: [name], MD [place],  [place] [id number] PCP: [name], MD   Assessment & Plan: Visit Diagnoses:  1. Spinal stenosis of lumbar region with neurogenic claudication   2. Acquired leg length discrepancy   3. Spondylosis without myelopathy or radiculopathy, lumbar region   4. Disc disease, degenerative, lumbar or lumbosacral   5. Spondylolisthesis, lumbar region     Plan: Avoid bending, stooping and avoid lifting weights greater than 10 lbs. Avoid prolong standing and walking. Order for a new walker with wheels. Surgery scheduling secretary Kandice Hams, will call you in the next week to schedule for surgery.  Surgery recommended is a five level lumbar fusion L1-2, L2-3, L3-4, L4-5 and L5-S1this would be done with rods, screws and cages with local bone graft and allograft (donor bone graft). Take hydrocodone for for pain. Risk of surgery includes risk of infection 1 in 300 patients, bleeding 20% chance you would need a transfusion.   Risk to the nerves is one in 10,000. You will need to use a brace for 3 months and wean from the brace on the 4th month. Expect improved walking and standing tolerance. Expect relief of leg pain but numbness may persist depending on the length and degree of pressure that has been present. Scoliosis radiographs to evaluate sagital alignment preop.     Follow-Up Instructions: Return in about 4 weeks (around 05/26/2020).   Follow-Up Instructions: Return in about 4 weeks (around 08/12/2020).   Orders:  No orders of the defined types were placed in this encounter.  No orders of the defined types were placed in this encounter.     Procedures: No procedures performed   Clinical Data: No additional findings.   Subjective: Chief Complaint  Patient  presents with  . Lower Back - Follow-up    70 year old male with a history of lumbar spondylosis with multiple level lumbar degenerative disc disease. He is having and experiencing Pain with standing and walking, bending, stooping and with recent right BKA related to ambulation. Pain is midline lumbar spine and he is presently in  Pain management and recently started on Burcal film 300 mcg for pain. He reports he can walk about 2/3rd of a mile. His pain is primarily back with radiation into the Left side lumbar spine. There is DJD of the SI joints. Previous lumbar central laminectomy and right BKA for infection following a right ankle fusion.    Review of Systems  Constitutional: Negative.   HENT: Negative.   Eyes: Negative.   Respiratory: Negative.   Cardiovascular: Negative.   Gastrointestinal: Negative.   Endocrine: Negative.   Genitourinary: Negative.   Musculoskeletal: Negative.   Skin: Negative.   Allergic/Immunologic: Negative.   Neurological: Negative.   Hematological: Negative.   Psychiatric/Behavioral: Negative.      Objective: Vital Signs: BP (!) 157/78 (BP Location: Left Arm, Patient Position: Sitting)   Pulse 99   Ht 6' 1"  (1.854 m)   Wt (!) 312 lb 1.6 oz (141.6 kg)   BMI 41.18 kg/m   Physical Exam Constitutional:      Appearance: He is well-developed and well-nourished.  HENT:  Head: Normocephalic and atraumatic.  Eyes:     Extraocular Movements: EOM normal.     Pupils: Pupils are equal, round, and reactive to light.  Pulmonary:     Effort: Pulmonary effort is normal.     Breath sounds: Normal breath sounds.  Abdominal:     General: Bowel sounds are normal.     Palpations: Abdomen is soft.  Musculoskeletal:     Cervical back: Normal range of motion and neck supple.     Lumbar back: Negative right straight leg raise test and negative left straight leg raise test.  Skin:    General: Skin is warm and dry.  Neurological:     Mental Status: He is  alert and oriented to person, place, and time.  Psychiatric:        Mood and Affect: Mood and affect normal.        Behavior: Behavior normal.        Thought Content: Thought content normal.        Judgment: Judgment normal.     Back Exam   Tenderness  The patient is experiencing tenderness in the lumbar.  Range of Motion  Extension: abnormal  Flexion: normal  Lateral bend right: abnormal  Lateral bend left: abnormal  Rotation right: abnormal  Rotation left: abnormal   Muscle Strength  Right Quadriceps:  5/5  Left Quadriceps:  5/5  Right Hamstrings:  5/5  Left Hamstrings:  5/5   Tests  Straight leg raise right: negative Straight leg raise left: negative  Reflexes  Patellar: 0/4 Achilles: 0/4  Other  Toe walk: normal Heel walk: normal Sensation: normal Gait: normal  Erythema: no back redness Scars: present      Specialty Comments:  No specialty comments available.  Imaging: No results found.   PMFS History: Patient Active Problem List   Diagnosis Date Noted  . Other spondylosis with radiculopathy, lumbar region 07/16/2018    Priority: High    Class: Chronic  . Spinal stenosis in cervical region 09/26/2013    Priority: High    Class: Chronic  . Spinal stenosis of lumbar region 09/26/2013    Priority: High    Class: Chronic  . Nasal sinus polyp 06/16/2020  . Vitamin D deficiency 02/24/2020  . Cough 07/29/2019  . Wheezing 07/29/2019  . Possible exposure to STD 07/24/2019  . Unilateral primary osteoarthritis, left hip 06/06/2019  . Status post total replacement of left hip 06/06/2019  . S/P laparoscopic sleeve gastrectomy 03/03/2019  . Rash 01/24/2019  . Fever 08/15/2018  . UTI (urinary tract infection) 08/15/2018  . Fall at home, initial encounter 08/15/2018  . Chronic anemia 08/15/2018  . Hypokalemia 08/15/2018  . Bowel incontinence 07/25/2018  . Status post lumbar laminectomy 07/16/2018  . Status post THR (total hip replacement)  04/17/2018  . Unilateral primary osteoarthritis, right hip   . Morbid obesity with BMI of 40.0-44.9, adult ([place]) 02/28/2018  . Myocardial infarction ([place]) 02/25/2018  . Coronary artery disease involving native coronary artery of native heart 02/25/2018  . PAD (peripheral artery disease) ([place]) 02/25/2018  . S/P BKA (below knee amputation) unilateral, right ([place]) 02/25/2018  . Sacroiliitis ([place]) 02/25/2018  . SOB (shortness of breath) 01/03/2018  . Acute on chronic heart failure ([place]) 01/03/2018  . Severe right groin pain 12/20/2017  . Morbid obesity ([place]) 10/09/2017  . Low back pain 07/13/2017  . Leukoplakia, tongue 01/26/2017  . Skin lesion 10/20/2016  . S/P unilateral BKA (below knee amputation), right ([place]) 06/14/2016  .  Charcot foot due to diabetes mellitus ([place])   . Charcot's arthropathy associated with type 2 diabetes mellitus ([place]) 04/11/2016  . Preventative health care 11/05/2015  . Major depression 09/13/2015  . S/P TKR (total knee replacement) bilaterally 09/13/2015  . GERD (gastroesophageal reflux disease) 09/08/2015  . S/P laparoscopic hernia repair 05/11/2015  . PPD positive 04/08/2015  . Benign neoplasm of descending colon   . Benign neoplasm of cecum   . Acute blood loss anemia   . Chronic anticoagulation   . Occult blood positive stool 10/17/2014  . General weakness 07/14/2014  . Urinary incontinence 07/14/2014  . Headache(784.0) 10/15/2013  . Hand joint pain 06/10/2013  . Rotator cuff tear arthropathy of both shoulders 06/10/2013  . Skin lesion of cheek 05/01/2013  . Pain of right thumb 04/03/2013  . Balance disorder 03/12/2013  . Gait disorder 03/12/2013  . Tremor 03/12/2013  . Left hip pain 03/12/2013  . Pre-ulcerative corn or callous 02/06/2013  . Anxiety 11/12/2011  . OSA on CPAP 11/07/2011  . Bradycardia 10/20/2011  . Insomnia 10/04/2011  . Obesity 01/12/2011  . Diabetes mellitus type 2 in obese ([place]) 09/27/2010  . ERECTILE DYSFUNCTION, ORGANIC  05/30/2010  . [place] DISEASE, LUMBAR 04/19/2010  . SCIATICA, LEFT 04/19/2010  . Chronic pain syndrome 10/27/2009  . Hyperlipidemia 07/15/2009  . Essential hypertension 06/24/2009  . Coronary artery disease involving native coronary artery of native heart without angina pectoris 06/24/2009  . Allergic rhinitis 06/24/2009  . URETHRAL STRICTURE 06/24/2009  . DEGENERATIVE JOINT DISEASE 06/24/2009  . SHOULDER PAIN, BILATERAL 06/24/2009  . FATIGUE 06/24/2009  . NEPHROLITHIASIS, HX OF 06/24/2009   Past Medical History:  Diagnosis Date  . ALLERGIC RHINITIS 06/24/2009  . Allergy   . Anemia    IDA  . Anxiety 11/12/2011   Adequate for discharge   . Arthritis    "all my joints" (09/30/2013)  . Arthritis of foot, right, degenerative 04/15/2014  . Balance disorder 03/12/2013  . Benign neoplasm of cecum   . Benign neoplasm of descending colon   . CAD (coronary artery disease) 06/24/2009   5 stents placed in 2007    . Chronic anticoagulation   . Chronic pain syndrome 10/27/2009   of ankle, shoulders, low back.  sciatica.   . Closed fracture of right foot 10/17/2014  . CORONARY ARTERY DISEASE 06/24/2009   a. s/p multiple PCIs - In 2008 he had a Taxus DES to the mild LAD, Endeavor DES to mid LCX and distal LCX. In January 2009 he had DES to distal LCX, mid LCX and proximal LCX. In November 2009 had BMS x 2 to the mid RCA. Cath 10/2011 with patent stents, noncardiac CP. LHC 01/2013: patent stents (noncardiac CP).  . DEGENERATIVE JOINT DISEASE 06/24/2009   Qualifier: Diagnosis of  By: [name], [name]   . Depression   . Depression with anxiety    Prior suicide attempt(08/25/19-pt states not suicide attempt)  . [place] DISEASE, LUMBAR 04/19/2010  . ERECTILE DYSFUNCTION, ORGANIC 05/30/2010  . Essential hypertension 06/24/2009   Qualifier: Diagnosis of  By: [name], [name]   . Fibromyalgia   . Fracture dislocation of ankle joint 09/02/2015  . Gait disorder 03/12/2013  . General weakness 07/14/2014  . GERD  (gastroesophageal reflux disease) 09/08/2015   08/25/15-pt states was cardiac origin, not GERD  . Hand joint pain 06/10/2013  . Heart murmur   . Hepatitis C    treated pt. unknown with what he was a teenager  . History of  kidney stones   . Hyperlipidemia 07/15/2009   Qualifier: Diagnosis of  By: Aundra Dubin, MD, [place]    . HYPERLIPIDEMIA-MIXED 07/15/2009   08/25/19-pt state cholesterol was normal range  . HYPERTENSION 06/24/2009  . Insomnia 10/04/2011  . Irregular heart beat   . Left hip pain 03/12/2013   Injected under ultrasound guidance on June 24, 2013   . Major depression 09/13/2015  . Myocardial infarction ([place]) July 20, 2006  . Non-cardiac chest pain 10/2011, 01/2013  . Obesity   . Occult blood positive stool 10/17/2014  . Open ankle fracture 09/02/2015  . OSA (obstructive sleep apnea)    not using CPAP (09/30/2013)  . Pain of right thumb 04/03/2013  . Pneumonia   . PPD positive 04/08/2015  . Pre-ulcerative corn or callous 02/06/2013  . Rotator cuff tear arthropathy of both shoulders 06/10/2013   History of bilateral shoulder cuff surgery for rotator cuff tears. Reports increase in pain 09/11/2015 during physical therapy of the left shoulder.   [name] SCIATICA, LEFT 04/19/2010   Qualifier: Diagnosis of  By: [name], [name]   . Sleep apnea    wears cpap 08/25/19-States not using due to nasal stuffiness.  [name] Spinal stenosis in cervical region 09/26/2013  . Spinal stenosis, lumbar region, with neurogenic claudication 09/26/2013  . Type II diabetes mellitus ([place]) 20-Jul-2010   no meds in 09/2014.   [name] Uncontrolled type 2 DM with peripheral circulatory disorder ([place]) 10/04/2013   08/25/19- pt states A1C normal, no diabetes  . URETHRAL STRICTURE 06/24/2009   self catheterizes.     Family History  Problem Relation Age of Onset  . Depression Mother   . Heart disease Mother   . Hypertension Mother   . Breast cancer Mother        primary cancer  . Stomach cancer Mother   . Esophageal cancer Mother   . Pancreatic cancer  Mother   . Diabetes Father   . Heart disease Father        CABG  . Hypertension Father   . Hyperlipidemia Father   . Prostate cancer Father   . Skin cancer Father   . Depression Brother        x 2  . Hypertension Brother        x2  . Colon polyps Brother   . Heart attack Son 28  . Early death Son   . Heart disease Maternal Grandfather   . Early death Maternal Grandfather   . Heart attack Maternal Grandfather 2063/07/21  . Early death Paternal Grandfather   . Coronary artery disease Other   . Hypertension Other   . Depression Other   . Healthy Son   . Colon cancer Neg Hx   . Rectal cancer Neg Hx     Past Surgical History:  Procedure Laterality Date  . AMPUTATION Right 06/14/2016   Procedure: AMPUTATION BELOW KNEE;  Surgeon: Newt Minion, MD;  Location: [facility];  Service: Orthopedics;  Laterality: Right;  . ANKLE FUSION Right 04/15/2014   Procedure: Right Subtalar, Talonavicular Fusion;  Surgeon: Newt Minion, MD;  Location: [facility];  Service: Orthopedics;  Laterality: Right;  . ANKLE FUSION Right 04/18/2016   Procedure: Right Ankle Tibiocalcaneal Fusion;  Surgeon: Newt Minion, MD;  Location: [facility];  Service: Orthopedics;  Laterality: Right;  . ANTERIOR CERVICAL DECOMP/DISCECTOMY FUSION N/A 09/26/2013   Procedure: ANTERIOR CERVICAL DISCECTOMY FUSION C3-4, plate and screw fixation, allograft bone graft;  Surgeon: Jessy Oto, MD;  Location: [facility];  Service: Orthopedics;  Laterality: N/A;  . BACK SURGERY     3  . BELOW KNEE LEG AMPUTATION Right 06/14/2016   right ankle and foot  . CARDIAC CATHETERIZATION  X 1  . CARPAL TUNNEL RELEASE Bilateral   . COLONOSCOPY N/A 10/22/2014   Procedure: COLONOSCOPY;  Surgeon: Lafayette Dragon, MD;  Location: [facility];  Service: Endoscopy;  Laterality: N/A;  . COLONOSCOPY  11/19/2018  . CORONARY ANGIOPLASTY WITH STENT PLACEMENT     "I have 9 stents"  . ESOPHAGOGASTRODUODENOSCOPY N/A 10/19/2014   Procedure: ESOPHAGOGASTRODUODENOSCOPY (EGD);  Surgeon:  Jerene Bears, MD;  Location: [facility] ENDOSCOPY;  Service: Endoscopy;  Laterality: N/A;  . FRACTURE SURGERY    . FUSION OF TALONAVICULAR JOINT Right 04/15/2014   dr duda  . GASTRIC BYPASS  02/20/2019  . HERNIA REPAIR     umbilical  . INGUINAL HERNIA REPAIR Right 05/11/2015   Procedure: LAPAROSCOPIC REPAIR RIGHT  INGUINAL HERNIA;  Surgeon: Greer Pickerel, MD;  Location: [facility];  Service: General;  Laterality: Right;  . INSERTION OF MESH Right 05/11/2015   Procedure: INSERTION OF MESH;  Surgeon: Greer Pickerel, MD;  Location: [facility];  Service: General;  Laterality: Right;  . JOINT REPLACEMENT    . KNEE CARTILAGE SURGERY Right X 12   "~ 1/2 open; ~ 1/2 scopes"  . KNEE CARTILAGE SURGERY Left X 3   "3 scopes"  . LAPAROSCOPIC GASTRIC SLEEVE RESECTION N/A 03/03/2019   Procedure: LAPAROSCOPIC GASTRIC SLEEVE RESECTION, Upper Endo, ERAS Pathway;  Surgeon: Greer Pickerel, MD;  Location: [facility];  Service: General;  Laterality: N/A;  . LEFT HEART CATHETERIZATION WITH CORONARY ANGIOGRAM N/A 02/10/2013   Procedure: LEFT HEART CATHETERIZATION WITH CORONARY ANGIOGRAM;  Surgeon: Burnell Blanks, MD;  Location: [facility] CATH LAB;  Service: Cardiovascular;  Laterality: N/A;  . LUMBAR LAMINECTOMY N/A 07/16/2018   Procedure: LEFT L4-5 REDO PARTIAL LUMBAR HEMILAMINECTOMY WITH FORAMINOTOMY LEFT L4;  Surgeon: Jessy Oto, MD;  Location: [place];  Service: Orthopedics;  Laterality: N/A;  . LUMBAR LAMINECTOMY/DECOMPRESSION MICRODISCECTOMY N/A 01/27/2014   Procedure: CENTRAL LUMBAR LAMINECTOMY L4-5 AND L3-4;  Surgeon: Jessy Oto, MD;  Location: [facility];  Service: Orthopedics;  Laterality: N/A;  . ORIF ANKLE FRACTURE Right 09/02/2015   Procedure: OPEN REDUCTION INTERNAL FIXATION (ORIF) ANKLE FRACTURE;  Surgeon: Newt Minion, MD;  Location: [place];  Service: Orthopedics;  Laterality: Right;  . PERIPHERALLY INSERTED CENTRAL CATHETER INSERTION  09/02/2015  . POLYPECTOMY    . ROTATOR CUFF REPAIR Left    x 2  . ROTATOR CUFF REPAIR Right     x 3  . SHOULDER ARTHROSCOPY W/ ROTATOR CUFF REPAIR Bilateral    "3 on the right; 1 on the left"  . SKIN SPLIT GRAFT Right 10/01/2015   Procedure: RIGHT ANKLE APPLY SKIN GRAFT SPLIT THICKNESS;  Surgeon: Newt Minion, MD;  Location: [place] [facility];  Service: Orthopedics;  Laterality: Right;  . TONSILLECTOMY    . TOOTH EXTRACTION    . TOTAL HIP ARTHROPLASTY Right 04/17/2018  . TOTAL HIP ARTHROPLASTY Right 04/17/2018   Procedure: RIGHT TOTAL HIP ARTHROPLASTY;  Surgeon: Newt Minion, MD;  Location: [facility];  Service: Orthopedics;  Laterality: Right;  . TOTAL HIP ARTHROPLASTY Left 06/06/2019   Procedure: LEFT TOTAL HIP ARTHROPLASTY ANTERIOR APPROACH;  Surgeon: Mcarthur Rossetti, MD;  Location: [facility];  Service: Orthopedics;  Laterality: Left;  . TOTAL HIP REVISION Left 05/24/2019  . TOTAL KNEE ARTHROPLASTY Bilateral 2008  . UMBILICAL HERNIA REPAIR  UHR  . UPPER GASTROINTESTINAL ENDOSCOPY  2016  . URETHRAL DILATION  X 4  . VASECTOMY    . WISDOM TOOTH EXTRACTION     Social History   Occupational History  . Occupation: disabled since 2006 due to ortho. heart, psych    Employer: UNEMPLOYED  . Occupation: part time work as an Multimedia programmer, wrestling, and Holiday representative  Tobacco Use  . Smoking status: Former Smoker    Types: Cigars    Quit date: 08/28/2010    Years since quitting: 9.8  . Smokeless tobacco: Never Used  . Tobacco comment: 04/18/2016 "smoked 1 cigar/wk when I did smoke"  Vaping Use  . Vaping Use: Never used  Substance and Sexual Activity  . Alcohol use: Not Currently    Alcohol/week: 0.0 standard drinks    Comment: rarely  . Drug use: No  . Sexual activity: Not Currently

## 2020-07-15 NOTE — Patient Instructions (Addendum)

## 2020-07-22 NOTE — Progress Notes (Signed)
Surgical Instructions   Your procedure is scheduled on Tuesday, March 8th.  Report to [facility] Main Entrance "A" at 5:30 A.M., then check in with the Admitting office.  Call this number if you have problems the morning of surgery:  [phone]   If you have any questions prior to your surgery date call [phone]: Open Monday-Friday 8am-4pm   Remember:  Do not eat after midnight the night before your surgery  You may drink clear liquids until 4:30 the morning of your surgery.   Clear liquids allowed are: Water, Non-Citrus Juices (without pulp), Carbonated Beverages, Clear Tea, Black Coffee Only, and Gatorade  Enhanced Recovery after Surgery for Orthopedics Enhanced Recovery after Surgery is a protocol used to improve the stress on your body and your recovery after surgery.  Patient Instructions  . The night before surgery:  o No food after midnight. ONLY clear liquids after midnight   . The day of surgery (if you do NOT have diabetes):  o Drink ONE (1) Pre-Surgery Clear Ensure by 4:30 A.M. the morning of surgery. This drink was given to you during your hospital  pre-op appointment visit. o Nothing else to drink after completing the  Pre-Surgery Clear Ensure.        If you have questions, please contact your surgeon's office.    Take these medicines the morning of surgery with A SIP OF WATER  ARIPiprazole (ABILIFY) atorvastatin (LIPITOR) DULoxetine (CYMBALTA)  gabapentin (NEURONTIN) oxybutynin (DITROPAN-XL)  pantoprazole (PROTONIX)   If needed: Azelastine-Fluticasone/nasal spray, HYDROcodone-acetaminophen (NORCO), methocarbamol (ROBAXIN), oxymetazoline (AFRIN)/nasal spray,    albuterol (VENTOLIN HFA)/inhaler - bring with you the morning of surgery  Follow your surgeon's instructions on when to stop Aspirin and prasugrel (EFFIENT).  If no instructions were given by your surgeon then you will need to call the office to get those instructions.    As of today, STOP taking  celecoxib (CELEBREX), phentermine, Aleve, Naproxen, Ibuprofen, Motrin, Advil, Goody's, BC's, all herbal medications, fish oil, and all vitamins.                     Do not wear jewelry.            Do not wear lotions, powders, colognes, or deodorant.            Men may shave face and neck.            Do not bring valuables to the hospital.            [facility] is not responsible for any belongings or valuables.  Do NOT Smoke (Tobacco/Vaping) or drink Alcohol 24 hours prior to your procedure If you use a CPAP at night, you may bring all equipment for your overnight stay.   Contacts, glasses, dentures or bridgework may not be worn into surgery, please bring cases for these belongings   For patients admitted to the hospital, discharge time will be determined by your treatment team.   Patients discharged the day of surgery will not be allowed to drive home, and someone needs to stay with them for 24 hours.  Special instructions:   [place]- Preparing For Surgery  Before surgery, you can play an important role. Because skin is not sterile, your skin needs to be as free of germs as possible. You can reduce the number of germs on your skin by washing with CHG (chlorahexidine gluconate) Soap before surgery.  CHG is an antiseptic cleaner which kills germs and bonds with the skin  to continue killing germs even after washing.    Oral Hygiene is also important to reduce your risk of infection.  Remember - BRUSH YOUR TEETH THE MORNING OF SURGERY WITH YOUR REGULAR TOOTHPASTE  Please do not use if you have an allergy to CHG or antibacterial soaps. If your skin becomes reddened/irritated stop using the CHG.  Do not shave (including legs and underarms) for at least 48 hours prior to first CHG shower. It is OK to shave your face.  Please follow these instructions carefully.   1. Shower the NIGHT BEFORE SURGERY and the MORNING OF SURGERY  2. If you chose to wash your hair, wash your hair first as  usual with your normal shampoo.  3. After you shampoo, rinse your hair and body thoroughly to remove the shampoo.  4. Wash Face and genitals (private parts) with your normal soap.   5. Use CHG Soap as you would any other liquid soap. You can apply CHG directly to the skin and wash gently with a scrungie or a clean washcloth.   6. Apply the CHG Soap to your body ONLY FROM THE NECK DOWN.  Do not use on open wounds or open sores. Avoid contact with your eyes, ears, mouth and genitals (private parts). Wash Face and genitals (private parts)  with your normal soap.   7. Wash thoroughly, paying special attention to the area where your surgery will be performed.  8. Thoroughly rinse your body with warm water from the neck down.  9. DO NOT shower/wash with your normal soap after using and rinsing off the CHG Soap.  10. Pat yourself dry with a CLEAN TOWEL.  11. Wear CLEAN PAJAMAS to bed the night before surgery  12. Place CLEAN SHEETS on your bed the night before your surgery  13. DO NOT SLEEP WITH PETS.  Day of Surgery: Shower with CHG soap Wear Clean/Comfortable clothing the morning of surgery Do not apply any deodorants/lotions.   Remember to brush your teeth WITH YOUR REGULAR TOOTHPASTE.   Please read over the following fact sheets that you were given.

## 2020-07-23 ENCOUNTER — Encounter (HOSPITAL_COMMUNITY)
Admission: RE | Admit: 2020-07-23 | Discharge: 2020-07-23 | Disposition: A | Discharge status: Home / Self Care | Payer: Medicare HMO | Source: Ambulatory Visit | Attending: Surgery | Admitting: Surgery

## 2020-07-23 ENCOUNTER — Encounter (HOSPITAL_COMMUNITY): Payer: Self-pay

## 2020-07-23 ENCOUNTER — Encounter (HOSPITAL_COMMUNITY)
Admission: RE | Admit: 2020-07-23 | Discharge: 2020-07-23 | Disposition: A | Discharge status: Home / Self Care | Payer: Medicare HMO | Source: Ambulatory Visit | Attending: Specialist | Admitting: Specialist

## 2020-07-23 ENCOUNTER — Other Ambulatory Visit (HOSPITAL_COMMUNITY)
Admission: RE | Admit: 2020-07-23 | Discharge: 2020-07-23 | Disposition: A | Discharge status: Home / Self Care | Payer: Medicare HMO | Source: Ambulatory Visit | Attending: Specialist | Admitting: Specialist

## 2020-07-23 ENCOUNTER — Other Ambulatory Visit: Payer: Self-pay

## 2020-07-23 DIAGNOSIS — Z01818 Encounter for other preprocedural examination: Secondary | ICD-10-CM

## 2020-07-23 DIAGNOSIS — Z01812 Encounter for preprocedural laboratory examination: Secondary | ICD-10-CM | POA: Insufficient documentation

## 2020-07-23 DIAGNOSIS — Z20822 Contact with and (suspected) exposure to covid-19: Secondary | ICD-10-CM | POA: Insufficient documentation

## 2020-07-23 DIAGNOSIS — R6889 Other general symptoms and signs: Secondary | ICD-10-CM | POA: Diagnosis not present

## 2020-07-23 HISTORY — DX: Umbilical hernia without obstruction or gangrene: K42.9

## 2020-07-23 LAB — COMPREHENSIVE METABOLIC PANEL
ALT: 18 U/L (ref 0–44)
AST: 19 U/L (ref 15–41)
Albumin: 3.5 g/dL (ref 3.5–5.0)
Alkaline Phosphatase: 51 U/L (ref 38–126)
Anion gap: 7 (ref 5–15)
BUN: 19 mg/dL (ref 8–23)
CO2: 26 mmol/L (ref 22–32)
Calcium: 8.6 mg/dL — ABNORMAL LOW (ref 8.9–10.3)
Chloride: 107 mmol/L (ref 98–111)
Creatinine, Ser: 1.03 mg/dL (ref 0.61–1.24)
GFR, Estimated: >60 mL/min (ref >60)
Glucose, Bld: 128 mg/dL — ABNORMAL HIGH (ref 70–99)
Potassium: 4.1 mmol/L (ref 3.5–5.1)
Sodium: 140 mmol/L (ref 135–145)
Total Bilirubin: 0.7 mg/dL (ref 0.3–1.2)
Total Protein: 5.9 g/dL — ABNORMAL LOW (ref 6.5–8.1)

## 2020-07-23 LAB — CBC
HCT: 40.7 % (ref 39.0–52.0)
Hemoglobin: 13.1 g/dL (ref 13.0–17.0)
MCH: 30.9 pg (ref 26.0–34.0)
MCHC: 32.2 g/dL (ref 30.0–36.0)
MCV: 96 fL (ref 80.0–100.0)
Platelets: 196 10*3/uL (ref 150–400)
RBC: 4.24 MIL/uL (ref 4.22–5.81)
RDW: 12.4 % (ref 11.5–15.5)
WBC: 4.3 10*3/uL (ref 4.0–10.5)
nRBC: 0 % (ref 0.0–0.2)

## 2020-07-23 LAB — SURGICAL PCR SCREEN
MRSA, PCR: NEGATIVE
Staphylococcus aureus: NEGATIVE

## 2020-07-23 LAB — TYPE AND SCREEN
ABO/RH(D): A POS
Antibody Screen: NEGATIVE

## 2020-07-23 LAB — SARS CORONAVIRUS 2 (TAT 6-24 HRS): SARS Coronavirus 2: NEGATIVE

## 2020-07-23 NOTE — Progress Notes (Signed)
Surgical Instructions   Your procedure is scheduled on Tuesday, March 8th.  Report to [facility] Main Entrance "A" at 5:30 A.M., then check in with the Admitting office.  Call this number if you have problems the morning of surgery:  [phone]   If you have any questions prior to your surgery date call [phone]: Open Monday-Friday 8am-4pm   Remember:  Do not eat after midnight the night before your surgery  You may drink clear liquids until 4:30 the morning of your surgery.   Clear liquids allowed are: Water, Non-Citrus Juices (without pulp), Carbonated Beverages, Clear Tea, Black Coffee Only, and Gatorade  Enhanced Recovery after Surgery for Orthopedics Enhanced Recovery after Surgery is a protocol used to improve the stress on your body and your recovery after surgery.  Patient Instructions  . The night before surgery:  o No food after midnight. ONLY clear liquids after midnight   . The day of surgery (if you do NOT have diabetes):  o Drink ONE (1) Pre-Surgery Clear Ensure by 4:30 A.M. the morning of surgery. This drink was given to you during your hospital  pre-op appointment visit. o Nothing else to drink after completing the  Pre-Surgery Clear Ensure.        If you have questions, please contact your surgeon's office.    Take these medicines the morning of surgery with A SIP OF WATER  ARIPiprazole (ABILIFY) atorvastatin (LIPITOR) DULoxetine (CYMBALTA)  oxybutynin (DITROPAN-XL)  pantoprazole (PROTONIX)  pregabalin (LYRICA)   If needed: Azelastine-Fluticasone/nasal spray, HYDROcodone-acetaminophen (NORCO), methocarbamol (ROBAXIN), oxymetazoline (AFRIN)/nasal spray,    albuterol (VENTOLIN HFA)/inhaler - bring with you the morning of surgery  Follow your surgeon's instructions on when to stop Aspirin and prasugrel (EFFIENT).  If no instructions were given by your surgeon then you will need to call the office to get those instructions.    As of today, STOP taking  celecoxib (CELEBREX), phentermine, Aleve, Naproxen, Ibuprofen, Motrin, Advil, Goody's, BC's, all herbal medications, fish oil, and all vitamins.                     Do not wear jewelry.            Do not wear lotions, powders, colognes, or deodorant.            Men may shave face and neck.            Do not bring valuables to the hospital.            [facility] is not responsible for any belongings or valuables.  Do NOT Smoke (Tobacco/Vaping) or drink Alcohol 24 hours prior to your procedure If you use a CPAP at night, you may bring all equipment for your overnight stay.   Contacts, glasses, dentures or bridgework may not be worn into surgery, please bring cases for these belongings   For patients admitted to the hospital, discharge time will be determined by your treatment team.   Patients discharged the day of surgery will not be allowed to drive home, and someone needs to stay with them for 24 hours.  Special instructions:   [place]- Preparing For Surgery  Before surgery, you can play an important role. Because skin is not sterile, your skin needs to be as free of germs as possible. You can reduce the number of germs on your skin by washing with CHG (chlorahexidine gluconate) Soap before surgery.  CHG is an antiseptic cleaner which kills germs and bonds with the  skin to continue killing germs even after washing.    Oral Hygiene is also important to reduce your risk of infection.  Remember - BRUSH YOUR TEETH THE MORNING OF SURGERY WITH YOUR REGULAR TOOTHPASTE  Please do not use if you have an allergy to CHG or antibacterial soaps. If your skin becomes reddened/irritated stop using the CHG.  Do not shave (including legs and underarms) for at least 48 hours prior to first CHG shower. It is OK to shave your face.  Please follow these instructions carefully.   1. Shower the NIGHT BEFORE SURGERY and the MORNING OF SURGERY  2. If you chose to wash your hair, wash your hair first as  usual with your normal shampoo.  3. After you shampoo, rinse your hair and body thoroughly to remove the shampoo.  4. Wash Face and genitals (private parts) with your normal soap.   5. Use CHG Soap as you would any other liquid soap. You can apply CHG directly to the skin and wash gently with a scrungie or a clean washcloth.   6. Apply the CHG Soap to your body ONLY FROM THE NECK DOWN.  Do not use on open wounds or open sores. Avoid contact with your eyes, ears, mouth and genitals (private parts). Wash Face and genitals (private parts)  with your normal soap.   7. Wash thoroughly, paying special attention to the area where your surgery will be performed.  8. Thoroughly rinse your body with warm water from the neck down.  9. DO NOT shower/wash with your normal soap after using and rinsing off the CHG Soap.  10. Pat yourself dry with a CLEAN TOWEL.  11. Wear CLEAN PAJAMAS to bed the night before surgery  12. Place CLEAN SHEETS on your bed the night before your surgery  13. DO NOT SLEEP WITH PETS.  Day of Surgery: Shower with CHG soap Wear Clean/Comfortable clothing the morning of surgery Do not apply any deodorants/lotions.   Remember to brush your teeth WITH YOUR REGULAR TOOTHPASTE.   Please read over the following fact sheets that you were given.

## 2020-07-23 NOTE — Progress Notes (Addendum)
PCP - Dr. [name] Cardiologist - Dr. Lauree Chandler  PPM/ICD - denies  Chest x-ray - 07/23/2020 EKG - 01/20/2020 Stress Test - 03/26/18 ECHO - 01/09/18 Cardiac Cath - denies  Sleep Study - Yes, per patient wears CPAP nightly  DM: denies  Blood Thinner Instructions: EFFIENT: patient stated last dose was taken on 07/20/2020 Aspirin Instructions: per patient last dose was taken on 07/20/2020  ERAS Protcol - Yes PRE-SURGERY Ensure or G2- Ensure given  COVID TEST- Scheduled for today 07/23/2020. Patient verbalized understanding of self-quarantine instructions, appointment time and place.  Anesthesia review: YES, cardiac hx , hx of CAD Patient stated, has stopped taken phentermine about 3 months ago Also, patient stated he has an appointment with Dr. Angelena Form on 07/26/2020, regular check-up.  Patient denies shortness of breath, fever, cough and chest pain at PAT appointment  All instructions explained to the patient, with a verbal understanding of the material. Patient agrees to go over the instructions while at home for a better understanding. Patient also instructed to self quarantine after being tested for COVID-19. The opportunity to ask questions was provided.

## 2020-07-26 ENCOUNTER — Encounter: Payer: Self-pay | Admitting: Cardiovascular Disease

## 2020-07-26 ENCOUNTER — Ambulatory Visit (INDEPENDENT_AMBULATORY_CARE_PROVIDER_SITE_OTHER): Payer: Medicare HMO | Admitting: Psychology

## 2020-07-26 ENCOUNTER — Ambulatory Visit (INDEPENDENT_AMBULATORY_CARE_PROVIDER_SITE_OTHER): Payer: Medicare HMO | Admitting: Cardiovascular Disease

## 2020-07-26 ENCOUNTER — Other Ambulatory Visit: Payer: Self-pay | Admitting: Internal Medicine

## 2020-07-26 ENCOUNTER — Other Ambulatory Visit: Payer: Self-pay

## 2020-07-26 VITALS — BP 136/72 | HR 45 | Ht 72.0 in | Wt 319.2 lb

## 2020-07-26 DIAGNOSIS — I10 Essential (primary) hypertension: Secondary | ICD-10-CM

## 2020-07-26 DIAGNOSIS — I2510 Atherosclerotic heart disease of native coronary artery without angina pectoris: Secondary | ICD-10-CM

## 2020-07-26 DIAGNOSIS — Z01810 Encounter for preprocedural cardiovascular examination: Secondary | ICD-10-CM | POA: Diagnosis not present

## 2020-07-26 DIAGNOSIS — R6889 Other general symptoms and signs: Secondary | ICD-10-CM | POA: Diagnosis not present

## 2020-07-26 DIAGNOSIS — F332 Major depressive disorder, recurrent severe without psychotic features: Secondary | ICD-10-CM

## 2020-07-26 MED ORDER — BUPIVACAINE LIPOSOME 1.3 % IJ SUSP
20.0000 mL | Freq: Once | INTRAMUSCULAR | Status: AC
  Administered 2020-07-27: 20 mL
  Filled 2020-07-26: qty 20

## 2020-07-26 MED ORDER — DEXTROSE 5 % IV SOLN
3.0000 g | INTRAVENOUS | Status: DC
  Filled 2020-07-26 (×2): qty 3000

## 2020-07-26 NOTE — H&P (Signed)
Travis FAUBLE Sr. is an 70 y.o. male.   Chief Complaint: Low back pain and lower extremity radiculopathy HPI: 70 year old white male with history of L1-S1 HNP/stenosis comes in for preop evaluation.  States that symptoms unchanged from previous visit.  He is wanting to proceed with RIGHT L1-2, LEFT L2-3, L3-4, L4-5 AND L5-S1 TRANSFORAMINAL LUMBAR INTERBODY FUSIONS, MPACT PEDICLE SCREWS AND RODS, CAGES L1-S1, LOCAL BONE GRAFT, ALLOGRAFT CANCELLOUS CHIPS, VIVIGEN.    Past Medical History:  Diagnosis Date  . ALLERGIC RHINITIS 06/24/2009  . Allergy   . Anemia    IDA  . Anxiety 11/12/2011   Adequate for discharge   . Arthritis    "all my joints" (09/30/2013)  . Arthritis of foot, right, degenerative 04/15/2014  . Balance disorder 03/12/2013  . Benign neoplasm of cecum   . Benign neoplasm of descending colon   . CAD (coronary artery disease) 06/24/2009   5 stents placed in 2007    . Chronic anticoagulation   . Chronic pain syndrome 10/27/2009   of ankle, shoulders, low back.  sciatica.   . Closed fracture of right foot 10/17/2014  . CORONARY ARTERY DISEASE 06/24/2009   a. s/p multiple PCIs - In 2008 he had a Taxus DES to the mild LAD, Endeavor DES to mid LCX and distal LCX. In January 2009 he had DES to distal LCX, mid LCX and proximal LCX. In November 2009 had BMS x 2 to the mid RCA. Cath 10/2011 with patent stents, noncardiac CP. LHC 01/2013: patent stents (noncardiac CP).  . DEGENERATIVE JOINT DISEASE 06/24/2009   Qualifier: Diagnosis of  By: [name], [name]   . Depression   . Depression with anxiety    Prior suicide attempt(08/25/19-pt states not suicide attempt)  . [place] DISEASE, LUMBAR 04/19/2010  . ERECTILE DYSFUNCTION, ORGANIC 05/30/2010  . Essential hypertension 06/24/2009   Qualifier: Diagnosis of  By: [name], [name]   . Fibromyalgia   . Fracture dislocation of ankle joint 09/02/2015  . Gait disorder 03/12/2013  . General weakness 07/14/2014  . GERD (gastroesophageal reflux disease)  09/08/2015   08/25/15-pt states was cardiac origin, not GERD  . Hand joint pain 06/10/2013  . Heart murmur   . Hepatitis C    treated pt. unknown with what he was a teenager  . History of kidney stones   . Hyperlipidemia 07/15/2009   Qualifier: Diagnosis of  By: Aundra Dubin, MD, [place]    . HYPERLIPIDEMIA-MIXED 07/15/2009   08/25/19-pt state cholesterol was normal range  . HYPERTENSION 06/24/2009  . Insomnia 10/04/2011  . Irregular heart beat   . Left hip pain 03/12/2013   Injected under ultrasound guidance on June 24, 2013   . Major depression 09/13/2015  . Myocardial infarction ([place]) 2008  . Non-cardiac chest pain 10/2011, 01/2013  . Obesity   . Occult blood positive stool 10/17/2014  . Open ankle fracture 09/02/2015  . OSA (obstructive sleep apnea)    not using CPAP (09/30/2013)  . Pain of right thumb 04/03/2013  . Pneumonia   . PPD positive 04/08/2015  . Pre-ulcerative corn or callous 02/06/2013  . Rotator cuff tear arthropathy of both shoulders 06/10/2013   History of bilateral shoulder cuff surgery for rotator cuff tears. Reports increase in pain 09/11/2015 during physical therapy of the left shoulder.   [name] SCIATICA, LEFT 04/19/2010   Qualifier: Diagnosis of  By: [name], [name]   . Sleep apnea    wears cpap 08/25/19-States not using due to nasal stuffiness.  [name]  Spinal stenosis in cervical region 09/26/2013  . Spinal stenosis, lumbar region, with neurogenic claudication 09/26/2013  . Type II diabetes mellitus ([place]) 2012   no meds in 09/2014.   [name] Umbilical hernia   . Uncontrolled type 2 DM with peripheral circulatory disorder ([place]) 10/04/2013   08/25/19- pt states A1C normal, no diabetes  . URETHRAL STRICTURE 06/24/2009   self catheterizes.     Past Surgical History:  Procedure Laterality Date  . AMPUTATION Right 06/14/2016   Procedure: AMPUTATION BELOW KNEE;  Surgeon: Newt Minion, MD;  Location: [place];  Service: Orthopedics;  Laterality: Right;  . ANKLE FUSION Right 04/15/2014   Procedure:  Right Subtalar, Talonavicular Fusion;  Surgeon: Newt Minion, MD;  Location: [place];  Service: Orthopedics;  Laterality: Right;  . ANKLE FUSION Right 04/18/2016   Procedure: Right Ankle Tibiocalcaneal Fusion;  Surgeon: Newt Minion, MD;  Location: [place];  Service: Orthopedics;  Laterality: Right;  . ANTERIOR CERVICAL DECOMP/DISCECTOMY FUSION N/A 09/26/2013   Procedure: ANTERIOR CERVICAL DISCECTOMY FUSION C3-4, plate and screw fixation, allograft bone graft;  Surgeon: Jessy Oto, MD;  Location: [place];  Service: Orthopedics;  Laterality: N/A;  . BACK SURGERY     3  . BELOW KNEE LEG AMPUTATION Right 06/14/2016   right ankle and foot  . CARDIAC CATHETERIZATION  X 1  . CARPAL TUNNEL RELEASE Bilateral   . COLONOSCOPY N/A 10/22/2014   Procedure: COLONOSCOPY;  Surgeon: Lafayette Dragon, MD;  Location: [facility] ENDOSCOPY;  Service: Endoscopy;  Laterality: N/A;  . COLONOSCOPY  11/19/2018  . CORONARY ANGIOPLASTY WITH STENT PLACEMENT     "I have 9 stents"  . ESOPHAGOGASTRODUODENOSCOPY N/A 10/19/2014   Procedure: ESOPHAGOGASTRODUODENOSCOPY (EGD);  Surgeon: Jerene Bears, MD;  Location: [facility] ENDOSCOPY;  Service: Endoscopy;  Laterality: N/A;  . FRACTURE SURGERY    . FUSION OF TALONAVICULAR JOINT Right 04/15/2014   dr duda  . GASTRIC BYPASS  02/20/2019  . HERNIA REPAIR     umbilical  . INGUINAL HERNIA REPAIR Right 05/11/2015   Procedure: LAPAROSCOPIC REPAIR RIGHT  INGUINAL HERNIA;  Surgeon: Greer Pickerel, MD;  Location: [place];  Service: General;  Laterality: Right;  . INSERTION OF MESH Right 05/11/2015   Procedure: INSERTION OF MESH;  Surgeon: Greer Pickerel, MD;  Location: [place];  Service: General;  Laterality: Right;  . JOINT REPLACEMENT    . KNEE CARTILAGE SURGERY Right X 12   "~ 1/2 open; ~ 1/2 scopes"  . KNEE CARTILAGE SURGERY Left X 3   "3 scopes"  . LAPAROSCOPIC GASTRIC SLEEVE RESECTION N/A 03/03/2019   Procedure: LAPAROSCOPIC GASTRIC SLEEVE RESECTION, Upper Endo, ERAS Pathway;  Surgeon: Greer Pickerel, MD;   Location: [facility];  Service: General;  Laterality: N/A;  . LEFT HEART CATHETERIZATION WITH CORONARY ANGIOGRAM N/A 02/10/2013   Procedure: LEFT HEART CATHETERIZATION WITH CORONARY ANGIOGRAM;  Surgeon: Burnell Blanks, MD;  Location: [facility] CATH LAB;  Service: Cardiovascular;  Laterality: N/A;  . LUMBAR LAMINECTOMY N/A 07/16/2018   Procedure: LEFT L4-5 REDO PARTIAL LUMBAR HEMILAMINECTOMY WITH FORAMINOTOMY LEFT L4;  Surgeon: Jessy Oto, MD;  Location: [place];  Service: Orthopedics;  Laterality: N/A;  . LUMBAR LAMINECTOMY/DECOMPRESSION MICRODISCECTOMY N/A 01/27/2014   Procedure: CENTRAL LUMBAR LAMINECTOMY L4-5 AND L3-4;  Surgeon: Jessy Oto, MD;  Location: [place];  Service: Orthopedics;  Laterality: N/A;  . ORIF ANKLE FRACTURE Right 09/02/2015   Procedure: OPEN REDUCTION INTERNAL FIXATION (ORIF) ANKLE FRACTURE;  Surgeon: Newt Minion, MD;  Location: [facility]  OR;  Service: Orthopedics;  Laterality: Right;  . PERIPHERALLY INSERTED CENTRAL CATHETER INSERTION  09/02/2015  . POLYPECTOMY    . ROTATOR CUFF REPAIR Left    x 2  . ROTATOR CUFF REPAIR Right    x 3  . SHOULDER ARTHROSCOPY W/ ROTATOR CUFF REPAIR Bilateral    "3 on the right; 1 on the left"  . SKIN SPLIT GRAFT Right 10/01/2015   Procedure: RIGHT ANKLE APPLY SKIN GRAFT SPLIT THICKNESS;  Surgeon: Newt Minion, MD;  Location: [place];  Service: Orthopedics;  Laterality: Right;  . TONSILLECTOMY    . TOOTH EXTRACTION    . TOTAL HIP ARTHROPLASTY Right 04/17/2018  . TOTAL HIP ARTHROPLASTY Right 04/17/2018   Procedure: RIGHT TOTAL HIP ARTHROPLASTY;  Surgeon: Newt Minion, MD;  Location: [place];  Service: Orthopedics;  Laterality: Right;  . TOTAL HIP ARTHROPLASTY Left 06/06/2019   Procedure: LEFT TOTAL HIP ARTHROPLASTY ANTERIOR APPROACH;  Surgeon: Mcarthur Rossetti, MD;  Location: [place];  Service: Orthopedics;  Laterality: Left;  . TOTAL HIP REVISION Left 05/24/2019  . TOTAL KNEE ARTHROPLASTY Bilateral 08/15/2006  . UMBILICAL HERNIA REPAIR     UHR  .  UPPER GASTROINTESTINAL ENDOSCOPY  August 15, 2014  . URETHRAL DILATION  X 4  . VASECTOMY    . WISDOM TOOTH EXTRACTION      Family History  Problem Relation Age of Onset  . Depression Mother   . Heart disease Mother   . Hypertension Mother   . Breast cancer Mother        primary cancer  . Stomach cancer Mother   . Esophageal cancer Mother   . Pancreatic cancer Mother   . Diabetes Father   . Heart disease Father        CABG  . Hypertension Father   . Hyperlipidemia Father   . Prostate cancer Father   . Skin cancer Father   . Depression Brother        x 2  . Hypertension Brother        x2  . Colon polyps Brother   . Heart attack Son 30  . Early death Son   . Heart disease Maternal Grandfather   . Early death Maternal Grandfather   . Heart attack Maternal Grandfather [id number]  . Early death Paternal Grandfather   . Coronary artery disease Other   . Hypertension Other   . Depression Other   . Healthy Son   . Colon cancer Neg Hx   . Rectal cancer Neg Hx    Social History:  reports that he quit smoking about 9 years ago. His smoking use included cigars. He has never used smokeless tobacco. He reports current alcohol use. He reports that he does not use drugs.  Allergies: No Known Allergies  No medications prior to admission.    No results found. However, due to the size of the patient record, not all encounters were searched. Please check Results Review for a complete set of results. No results found.  Review of Systems  Constitutional: Positive for activity change.  HENT: Negative.   Respiratory: Negative.   Cardiovascular: Negative.   Gastrointestinal: Negative.   Musculoskeletal: Positive for back pain and gait problem.  Neurological: Positive for numbness.  Psychiatric/Behavioral: Negative.     There were no vitals taken for this visit. Physical Exam Constitutional:      Appearance: He is obese.  HENT:     Head: Normocephalic.     Nose: Nose normal.  Eyes:  Extraocular Movements: Extraocular movements intact.  Cardiovascular:     Rate and Rhythm: Regular rhythm.  Pulmonary:     Effort: No respiratory distress.     Breath sounds: Normal breath sounds.  Abdominal:     General: Bowel sounds are normal.  Musculoskeletal:        General: Tenderness present.  Neurological:     Mental Status: He is alert and oriented to person, place, and time.      Assessment/Plan L1-S1 HNP/stenosis  We will proceed with surgery as scheduled.  Procedure discussed in great detail with Dr. Louanne Skye.  All questions answered.  Benjiman Core, PA-C 07/26/2020, 3:04 PM

## 2020-07-26 NOTE — Progress Notes (Signed)
Anesthesia Chart Review:  Follows with cardiology for hx of HLD, HTN, CAD (DES 2008, DES 2009, BMS 2009, patent stents on cath 10/2011 and 01/2013). Nuclear stress test in 2019 was negative for ischemia.  EF was normal by echocardiogram in 8/19. Clearance per telephone encounter 05/27/20, "Chart reviewed as part of pre-operative protocol coverage. Given past medical history and time since last visit, based on ACC/AHA guidelines, Travis BONES Sr. would be at acceptable risk for the planned procedure without further cardiovascular testing. Per Dr. Angelena Form, he may hold ASA and Effient prior to procedure and restart once stable from a surgical standpoint." He had a regularly scheduled followup with Dr. Angelena Form on 07/26/20 and he also commented on surgery. Per note, "Pre-operative cardiovascular risk assessment: He is limited due to his back pain and obesity. No angina, CHF or arrhythmias. He can proceed with his planned surgical procedure. He has been holding his ASA and Effient for the past 6 days."  S/p laparascopic gastric sleeve resection 97/94/80 without complication.  OSA on CPAP.  Per pt. Last dose effient and asa 07/20/20.  Preop labs reviewed, unremarkable.  Myocardial Perfusion 03/26/2018  Nuclear stress EF: 47%.  There was no ST segment deviation noted during stress.  No T wave inversion was noted during stress.  This is an intermediate risk study due to reduced systolic function. There is no ischemia.  The left ventricular ejection fraction is mildly decreased (45-54%).  Echo 01/09/2018 Study Conclusions  - Left ventricle: The cavity size was normal. Systolic function was normal. The estimated ejection fraction was in the range of 55% to 60%. Wall motion was normal; there were no regional wall motion abnormalities. Doppler parameters are consistent with abnormal left ventricular relaxation (grade 1 diastolic dysfunction). - Aortic valve: Transvalvular velocity was  within the normal range. There was no stenosis. There was no regurgitation. - Mitral valve: Transvalvular velocity was within the normal range. There was no evidence for stenosis. There was trivial regurgitation. - Right ventricle: The cavity size was normal. Wall thickness was normal. Systolic function was normal. - Tricuspid valve: There was trivial regurgitation. - Pulmonary arteries: Systolic pressure was within the normal range. PA peak pressure: 24 mm Hg (S).   Travis [name] [facility] Short Stay Center/Anesthesiology Phone [phone] 07/26/2020 3:48 PM

## 2020-07-26 NOTE — Patient Instructions (Signed)
Medication Instructions:  No changes *If you need a refill on your cardiac medications before your next appointment, please call your pharmacy*   Lab Work: none If you have labs (blood work) drawn today and your tests are completely normal, you will receive your results only by: [name] MyChart Message (if you have MyChart) OR . A paper copy in the mail If you have any lab test that is abnormal or we need to change your treatment, we will call you to review the results.   Testing/Procedures: none   Follow-Up: At [facility], you and your health needs are our priority.  As part of our continuing mission to provide you with exceptional heart care, we have created designated Provider Care Teams.  These Care Teams include your primary Cardiologist (physician) and Advanced Practice Providers (APPs -  Physician Assistants and Nurse Practitioners) who all work together to provide you with the care you need, when you need it.   Your next appointment:   12 month(s)  The format for your next appointment:   In Person  Provider:   You may see Lauree Chandler, MD or one of the following Advanced Practice Providers on your designated Care Team:    Melina Copa, PA-C  Ermalinda Barrios, PA-C    Other Instructions

## 2020-07-26 NOTE — Anesthesia Preprocedure Evaluation (Addendum)
Anesthesia Evaluation  Patient identified by MRN, date of birth, ID band Patient awake    Reviewed: Allergy & Precautions, H&P , NPO status , Patient's Chart, lab work & pertinent test results  Airway Mallampati: II  TM Distance: >3 FB Neck ROM: Full    Dental no notable dental hx. (+) Partial Lower, Partial Upper, Dental Advisory Given   Pulmonary sleep apnea and Continuous Positive Airway Pressure Ventilation , former smoker,    Pulmonary exam normal breath sounds clear to auscultation       Cardiovascular Exercise Tolerance: Good hypertension, Pt. on medications + CAD, + Past MI, + Cardiac Stents and + Peripheral Vascular Disease   Rhythm:Regular Rate:Normal     Neuro/Psych  Headaches, Anxiety Depression    GI/Hepatic Neg liver ROS, GERD  Medicated,  Endo/Other  diabetesMorbid obesity  Renal/GU negative Renal ROS  negative genitourinary   Musculoskeletal  (+) Arthritis , Osteoarthritis,  Fibromyalgia -  Abdominal   Peds  Hematology negative hematology ROS (+)   Anesthesia Other Findings   Reproductive/Obstetrics negative OB ROS                           Anesthesia Physical Anesthesia Plan  ASA: III  Anesthesia Plan: General   Post-op Pain Management:    Induction: Intravenous  PONV Risk Score and Plan: 3 and Ondansetron, Dexamethasone and Midazolam  Airway Management Planned: Oral ETT  Additional Equipment: Arterial line  Intra-op Plan:   Post-operative Plan: Extubation in OR and Possible Post-op intubation/ventilation  Informed Consent: I have reviewed the patients History and Physical, chart, labs and discussed the procedure including the risks, benefits and alternatives for the proposed anesthesia with the patient or authorized representative who has indicated his/her understanding and acceptance.     Dental advisory given  Plan Discussed with: CRNA  Anesthesia  Plan Comments: (PAT note by Karoline Caldwell, PA-C:  Follows with cardiology for hx of HLD, HTN, CAD (DES 2008, DES 2009, BMS 2009, patent stents on cath 10/2011 and 01/2013). Nuclear stress test in 2019 was negative for ischemia.  EF was normal by echocardiogram in 8/19. Clearance per telephone encounter 05/27/20, "Chart reviewed as part of pre-operative protocol coverage. Given past medical history and time since last visit, based on ACC/AHA guidelines, Travis BRANDY Sr. would be at acceptable risk for the planned procedure without further cardiovascular testing. Per Dr. Angelena Form, he may hold ASA and Effient prior to procedure and restart once stable from a surgical standpoint." He had a regularly scheduled followup with Dr. Angelena Form on 07/26/20 and he also commented on surgery. Per note, "Pre-operative cardiovascular risk assessment: He is limited due to his back pain and obesity. No angina, CHF or arrhythmias. He can proceed with his planned surgical procedure. He has been holding his ASA and Effient for the past 6 days."  S/p laparascopic gastric sleeve resection 33/54/56 without complication.  OSA on CPAP.  Per pt. Last dose effient and asa 07/20/20.  Preop labs reviewed, unremarkable.  Myocardial Perfusion 03/26/2018 Nuclear stress EF: 47%. There was no ST segment deviation noted during stress. No T wave inversion was noted during stress. This is an intermediate risk study due to reduced systolic function. There is no ischemia. The left ventricular ejection fraction is mildly decreased (45-54%).  Echo 01/09/2018 Study Conclusions  - Left ventricle: The cavity size was normal. Systolic function was normal. The estimated ejection fraction was in the range of 55% to 60%. Wall motion  was normal; there were no regional wall motion abnormalities. Doppler parameters are consistent with abnormal left ventricular relaxation (grade 1 diastolic dysfunction). - Aortic valve: Transvalvular  velocity was within the normal range. There was no stenosis. There was no regurgitation. - Mitral valve: Transvalvular velocity was within the normal range. There was no evidence for stenosis. There was trivial regurgitation. - Right ventricle: The cavity size was normal. Wall thickness was normal. Systolic function was normal. - Tricuspid valve: There was trivial regurgitation. - Pulmonary arteries: Systolic pressure was within the normal range. PA peak pressure: 24 mm Hg (S). )    Anesthesia Quick Evaluation

## 2020-07-26 NOTE — Progress Notes (Signed)
Chief Complaint  Patient presents with  . Follow-up    CAD   History of Present Illness: 70 yo male with history of sleep apnea, chronic pain syndrome, fibromyalgia, CAD s/p multiple prior PCIs, depression and anxiety, HTN, HLD, spinal stenosis and GERD here today for cardiac follow up. He has been followed in our office by Dr. Saunders Revel and previously by Dr. Aundra Dubin. He has been maintained on ASA and Effient 5 mg daily given multiple stents and poor platelet inhibition on Plavix. He had a Taxus DES placed in the mid LAD in 2008, Endeavor DES in the Circumflex in 2008, bare metal stent mid RCA in 2009, Xience DES Circumflex 2009. Last cardiac cath in 2013 with patent stents and non-obstructive CAD. Echo August 2019 with LVEF=55-60%. Grade 1 diastolic dysfunction. No significant valve disease. Nuclear stress test November 2019 with no ischemia. LVEF noted to be 47%. He neurogenic claudication due to spinal stenosis and has a lumbar fusion planned for tomorrow.   He is here today for follow up. The patient denies any chest pain, dyspnea, palpitations, lower extremity edema, orthopnea, PND, dizziness, near syncope or syncope. He continues to have back and buttock pain.   Primary Care Physician: [name], MD  Past Medical History:  Diagnosis Date  . ALLERGIC RHINITIS 06/24/2009  . Allergy   . Anemia    IDA  . Anxiety 11/12/2011   Adequate for discharge   . Arthritis    "all my joints" (09/30/2013)  . Arthritis of foot, right, degenerative 04/15/2014  . Balance disorder 03/12/2013  . Benign neoplasm of cecum   . Benign neoplasm of descending colon   . CAD (coronary artery disease) 06/24/2009   5 stents placed in 2007    . Chronic anticoagulation   . Chronic pain syndrome 10/27/2009   of ankle, shoulders, low back.  sciatica.   . Closed fracture of right foot 10/17/2014  . CORONARY ARTERY DISEASE 06/24/2009   a. s/p multiple PCIs - In 2008 he had a Taxus DES to the mild LAD, Endeavor DES to mid LCX  and distal LCX. In January 2009 he had DES to distal LCX, mid LCX and proximal LCX. In November 2009 had BMS x 2 to the mid RCA. Cath 10/2011 with patent stents, noncardiac CP. LHC 01/2013: patent stents (noncardiac CP).  . DEGENERATIVE JOINT DISEASE 06/24/2009   Qualifier: Diagnosis of  By: [name], [name]   . Depression   . Depression with anxiety    Prior suicide attempt(08/25/19-pt states not suicide attempt)  . [place] DISEASE, LUMBAR 04/19/2010  . ERECTILE DYSFUNCTION, ORGANIC 05/30/2010  . Essential hypertension 06/24/2009   Qualifier: Diagnosis of  By: [name], [name]   . Fibromyalgia   . Fracture dislocation of ankle joint 09/02/2015  . Gait disorder 03/12/2013  . General weakness 07/14/2014  . GERD (gastroesophageal reflux disease) 09/08/2015   08/25/15-pt states was cardiac origin, not GERD  . Hand joint pain 06/10/2013  . Heart murmur   . Hepatitis C    treated pt. unknown with what he was a teenager  . History of kidney stones   . Hyperlipidemia 07/15/2009   Qualifier: Diagnosis of  By: Aundra Dubin, MD, [place]    . HYPERLIPIDEMIA-MIXED 07/15/2009   08/25/19-pt state cholesterol was normal range  . HYPERTENSION 06/24/2009  . Insomnia 10/04/2011  . Irregular heart beat   . Left hip pain 03/12/2013   Injected under ultrasound guidance on June 24, 2013   . Major  depression 09/13/2015  . Myocardial infarction ([place]) 2008  . Non-cardiac chest pain 10/2011, 01/2013  . Obesity   . Occult blood positive stool 10/17/2014  . Open ankle fracture 09/02/2015  . OSA (obstructive sleep apnea)    not using CPAP (09/30/2013)  . Pain of right thumb 04/03/2013  . Pneumonia   . PPD positive 04/08/2015  . Pre-ulcerative corn or callous 02/06/2013  . Rotator cuff tear arthropathy of both shoulders 06/10/2013   History of bilateral shoulder cuff surgery for rotator cuff tears. Reports increase in pain 09/11/2015 during physical therapy of the left shoulder.   [name] SCIATICA, LEFT 04/19/2010   Qualifier: Diagnosis of  By:  [name], [name]   . Sleep apnea    wears cpap 08/25/19-States not using due to nasal stuffiness.  [name] Spinal stenosis in cervical region 09/26/2013  . Spinal stenosis, lumbar region, with neurogenic claudication 09/26/2013  . Type II diabetes mellitus ([place]) 2012   no meds in 09/2014.   [name] Umbilical hernia   . Uncontrolled type 2 DM with peripheral circulatory disorder ([place]) 10/04/2013   08/25/19- pt states A1C normal, no diabetes  . URETHRAL STRICTURE 06/24/2009   self catheterizes.     Past Surgical History:  Procedure Laterality Date  . AMPUTATION Right 06/14/2016   Procedure: AMPUTATION BELOW KNEE;  Surgeon: Newt Minion, MD;  Location: [place];  Service: Orthopedics;  Laterality: Right;  . ANKLE FUSION Right 04/15/2014   Procedure: Right Subtalar, Talonavicular Fusion;  Surgeon: Newt Minion, MD;  Location: [place];  Service: Orthopedics;  Laterality: Right;  . ANKLE FUSION Right 04/18/2016   Procedure: Right Ankle Tibiocalcaneal Fusion;  Surgeon: Newt Minion, MD;  Location: [place];  Service: Orthopedics;  Laterality: Right;  . ANTERIOR CERVICAL DECOMP/DISCECTOMY FUSION N/A 09/26/2013   Procedure: ANTERIOR CERVICAL DISCECTOMY FUSION C3-4, plate and screw fixation, allograft bone graft;  Surgeon: Jessy Oto, MD;  Location: [place];  Service: Orthopedics;  Laterality: N/A;  . BACK SURGERY     3  . BELOW KNEE LEG AMPUTATION Right 06/14/2016   right ankle and foot  . CARDIAC CATHETERIZATION  X 1  . CARPAL TUNNEL RELEASE Bilateral   . COLONOSCOPY N/A 10/22/2014   Procedure: COLONOSCOPY;  Surgeon: Lafayette Dragon, MD;  [facility] ENDOSCOPY;  Service: Endoscopy;  Laterality: N/A;  . COLONOSCOPY  11/19/2018  . CORONARY ANGIOPLASTY WITH STENT PLACEMENT     "I have 9 stents"  . ESOPHAGOGASTRODUODENOSCOPY N/A 10/19/2014   Procedure: ESOPHAGOGASTRODUODENOSCOPY (EGD);  Surgeon: Jerene Bears, MD;  Location: [facility] ENDOSCOPY;  Service: Endoscopy;  Laterality: N/A;  . FRACTURE SURGERY    . FUSION OF TALONAVICULAR  JOINT Right 04/15/2014   dr duda  . GASTRIC BYPASS  02/20/2019  . HERNIA REPAIR     umbilical  . INGUINAL HERNIA REPAIR Right 05/11/2015   Procedure: LAPAROSCOPIC REPAIR RIGHT  INGUINAL HERNIA;  Surgeon: Greer Pickerel, MD;  Location: [place];  Service: General;  Laterality: Right;  . INSERTION OF MESH Right 05/11/2015   Procedure: INSERTION OF MESH;  Surgeon: Greer Pickerel, MD;  Location: [place];  Service: General;  Laterality: Right;  . JOINT REPLACEMENT    . KNEE CARTILAGE SURGERY Right X 12   "~ 1/2 open; ~ 1/2 scopes"  . KNEE CARTILAGE SURGERY Left X 3   "3 scopes"  . LAPAROSCOPIC GASTRIC SLEEVE RESECTION N/A 03/03/2019   Procedure: LAPAROSCOPIC GASTRIC SLEEVE RESECTION, Upper Endo, ERAS Pathway;  Surgeon: Greer Pickerel, MD;  Location: [facility];  Service: General;  Laterality: N/A;  . LEFT HEART CATHETERIZATION WITH CORONARY ANGIOGRAM N/A 02/10/2013   Procedure: LEFT HEART CATHETERIZATION WITH CORONARY ANGIOGRAM;  Surgeon: Burnell Blanks, MD;  Location: [facility] CATH LAB;  Service: Cardiovascular;  Laterality: N/A;  . LUMBAR LAMINECTOMY N/A 07/16/2018   Procedure: LEFT L4-5 REDO PARTIAL LUMBAR HEMILAMINECTOMY WITH FORAMINOTOMY LEFT L4;  Surgeon: Jessy Oto, MD;  Location: [place];  Service: Orthopedics;  Laterality: N/A;  . LUMBAR LAMINECTOMY/DECOMPRESSION MICRODISCECTOMY N/A 01/27/2014   Procedure: CENTRAL LUMBAR LAMINECTOMY L4-5 AND L3-4;  Surgeon: Jessy Oto, MD;  Location: ;  Service: Orthopedics;  Laterality: N/A;  . ORIF ANKLE FRACTURE Right 09/02/2015   Procedure: OPEN REDUCTION INTERNAL FIXATION (ORIF) ANKLE FRACTURE;  Surgeon: Newt Minion, MD;  Location: [place];  Service: Orthopedics;  Laterality: Right;  . PERIPHERALLY INSERTED CENTRAL CATHETER INSERTION  09/02/2015  . POLYPECTOMY    . ROTATOR CUFF REPAIR Left    x 2  . ROTATOR CUFF REPAIR Right    x 3  . SHOULDER ARTHROSCOPY W/ ROTATOR CUFF REPAIR Bilateral    "3 on the right; 1 on the left"  . SKIN SPLIT GRAFT Right  10/01/2015   Procedure: RIGHT ANKLE APPLY SKIN GRAFT SPLIT THICKNESS;  Surgeon: Newt Minion, MD;  Location: [place];  Service: Orthopedics;  Laterality: Right;  . TONSILLECTOMY    . TOOTH EXTRACTION    . TOTAL HIP ARTHROPLASTY Right 04/17/2018  . TOTAL HIP ARTHROPLASTY Right 04/17/2018   Procedure: RIGHT TOTAL HIP ARTHROPLASTY;  Surgeon: Newt Minion, MD;  Location: [place];  Service: Orthopedics;  Laterality: Right;  . TOTAL HIP ARTHROPLASTY Left 06/06/2019   Procedure: LEFT TOTAL HIP ARTHROPLASTY ANTERIOR APPROACH;  Surgeon: Mcarthur Rossetti, MD;  Location: [place];  Service: Orthopedics;  Laterality: Left;  . TOTAL HIP REVISION Left 05/24/2019  . TOTAL KNEE ARTHROPLASTY Bilateral 2008  . UMBILICAL HERNIA REPAIR     UHR  . UPPER GASTROINTESTINAL ENDOSCOPY  2016  . URETHRAL DILATION  X 4  . VASECTOMY    . WISDOM TOOTH EXTRACTION      Current Outpatient Medications  Medication Sig Dispense Refill  . albuterol (VENTOLIN HFA) 108 (90 Base) MCG/ACT inhaler Inhale 2 puffs into the lungs every 6 (six) hours as needed for wheezing or shortness of breath. 18 g 1  . amoxicillin (AMOXIL) 500 MG capsule Take 2,000 mg by mouth daily as needed. AS NEEDED FOR DENTAL WORK    . ARIPiprazole (ABILIFY) 10 MG tablet Take 1 tablet (10 mg total) by mouth daily. 90 tablet 0  . aspirin 81 MG chewable tablet Chew 1 tablet (81 mg total) by mouth daily. 35 tablet 0  . atorvastatin (LIPITOR) 20 MG tablet Take 1 tablet (20 mg total) by mouth daily. (Patient taking differently: Take 20 mg by mouth daily.) 90 tablet 0  . Azelastine-Fluticasone 137-50 MCG/ACT SUSP Use both nostrils as directed twice daily (Patient taking differently: Place 1 spray into both nostrils in the morning and at bedtime.) 23 g 11  . benztropine (COGENTIN) 0.5 MG tablet Take 1 tablet (0.5 mg total) by mouth at bedtime. 90 tablet 0  . buprenorphine (BUTRANS) 20 MCG/HR PTWK Place 1 patch onto the skin once a week.    . calcium carbonate  (TUMS - DOSED IN MG ELEMENTAL CALCIUM) 500 MG chewable tablet Chew 1 tablet by mouth 3 (three) times daily with meals.    . celecoxib (CELEBREX) 200 MG capsule  Take 1 capsule (200 mg total) by mouth 2 (two) times daily. 180 capsule 3  . DULoxetine (CYMBALTA) 30 MG capsule Take 1 capsule (30 mg total) by mouth 2 (two) times daily. 180 capsule 0  . hydrochlorothiazide (HYDRODIURIL) 25 MG tablet Take 1 tablet (25 mg total) by mouth daily. (Patient taking differently: Take 25 mg by mouth daily.) 90 tablet 3  . HYDROcodone-acetaminophen (NORCO) 10-325 MG tablet Take 1 tablet by mouth every 6 (six) hours as needed for moderate pain.    . iron polysaccharides (NU-IRON) 150 MG capsule Take 1 capsule (150 mg total) by mouth daily. 90 capsule 1  . Melatonin 5 MG TABS Take 15 mg by mouth at bedtime.    . methocarbamol (ROBAXIN) 500 MG tablet Take 1 tablet (500 mg total) by mouth every 8 (eight) hours as needed for muscle spasms. 90 tablet 1  . MYRBETRIQ 50 MG TB24 tablet Take 50 mg by mouth at bedtime.     . Omega-3 Fatty Acids (FISH OIL) 1000 MG CAPS Take 1,000 mg by mouth at bedtime.     [name] oxybutynin (DITROPAN-XL) 10 MG 24 hr tablet Take 10 mg by mouth daily.    [name] oxymetazoline (AFRIN) 0.05 % nasal spray Place 1 spray into both nostrils 2 (two) times daily as needed for congestion.    . pantoprazole (PROTONIX) 40 MG tablet Take 1 tablet (40 mg total) by mouth 2 (two) times daily. (Patient taking differently: Take 40 mg by mouth 2 (two) times daily before a meal.) 60 tablet 2  . phentermine 37.5 MG capsule Take 1 capsule (37.5 mg total) by mouth every morning. 30 capsule 2  . prasugrel (EFFIENT) 5 MG TABS tablet Take 1 tablet (5 mg total) by mouth daily. 90 tablet 3  . predniSONE (DELTASONE) 10 MG tablet 3 tabs by mouth per day for 3 days,2tabs per day for 3 days,1tab per day for 3 days 18 tablet 0  . pregabalin (LYRICA) 200 MG capsule Take 200 mg by mouth in the morning, at noon, and at bedtime.    .  sildenafil (VIAGRA) 100 MG tablet Take 100 mg by mouth daily as needed for erectile dysfunction.     . tamsulosin (FLOMAX) 0.4 MG CAPS capsule Take 0.4 mg by mouth 2 (two) times daily.    . Testosterone 1.62 % GEL Place 1 Pump onto the skin daily. 88 g 1  . traZODone (DESYREL) 150 MG tablet Take 1 tablet (150 mg total) by mouth at bedtime. 90 tablet 0  . ZENPEP 40000-126000 units CPEP Take 2 capsules (80,000 units) with meals and 1 capsule (40,000 units) with each snack (Patient taking differently: Take 1-2 capsules by mouth See admin instructions. Take 2 capsules (80,000 units) with meals and 1 capsule (40,000 units) with each snack) 720 capsule 1  . zolpidem (AMBIEN) 10 MG tablet Take 10 mg by mouth at bedtime as needed for sleep.     No current facility-administered medications for this visit.   Facility-Administered Medications Ordered in Other Visits  Medication Dose Route Frequency Provider Last Rate Last Admin  . [START ON 07/27/2020] bupivacaine liposome (EXPAREL) 1.3 % injection 266 mg  20 mL Infiltration Once Jessy Oto, MD      . Derrill Memo ON 07/27/2020] ceFAZolin (ANCEF) 3 g in dextrose 5 % 50 mL IVPB  3 g Intravenous On Call to [place], RPH        No Known Allergies  Social History   Socioeconomic History  .  Marital status: Divorced    Spouse name: Not on file  . Number of children: 2  . Years of education: Not on file  . Highest education level: Not on file  Occupational History  . Occupation: disabled since 29-Jul-2004 due to ortho. heart, psych    Employer: UNEMPLOYED  . Occupation: part time work as an Multimedia programmer, wrestling, and Holiday representative  Tobacco Use  . Smoking status: Former Smoker    Types: Cigars    Quit date: 08/28/2010    Years since quitting: 9.9  . Smokeless tobacco: Never Used  . Tobacco comment: 04/18/2016 "smoked 1 cigar/wk when I did smoke"  Vaping Use  . Vaping Use: Never used  Substance and Sexual Activity  .  Alcohol use: Yes    Alcohol/week: 0.0 standard drinks    Comment: occasionally  . Drug use: No  . Sexual activity: Not Currently  Other Topics Concern  . Not on file  Social History Narrative   Played semi-pro football, used steroids   Divorced moved here from [place], [place]   Patient states he has been on disability since his knee surgery.      03/05/2013 AHW "[name]" was born and grew up in [place], [place], and moved to [place], [place] at age 56. He has one sister and 3 brothers. He reports that his childhood was rough. His parents are deceased. He graduated from [employer], and attended one year of college. He is currently unemployed and on disability for multiple medical problems. He has been married 5 times. He has 2 sons. He currently lives alone. 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 He denies that he has any social support system. 03/05/2013 AHW         Social Determinants of Health   Financial Resource Strain: Not on file  Food Insecurity: Not on file  Transportation Needs: Not on file  Physical Activity: Not on file  Stress: Not on file  Social Connections: Not on file  Intimate Partner Violence: Not on file    Family History  Problem Relation Age of Onset  . Depression Mother   . Heart disease Mother   . Hypertension Mother   . Breast cancer Mother        primary cancer  . Stomach cancer Mother   . Esophageal cancer Mother   . Pancreatic cancer Mother   . Diabetes Father   . Heart disease Father        CABG  . Hypertension Father   . Hyperlipidemia Father   . Prostate cancer Father   . Skin cancer Father   . Depression Brother        x 2  . Hypertension Brother        x2  . Colon polyps Brother   . Heart attack Son 103  . Early death Son   . Heart disease Maternal Grandfather   . Early death Maternal Grandfather   . Heart attack Maternal Grandfather Jul 30, 2063  . Early death Paternal Grandfather   . Coronary  artery disease Other   . Hypertension Other   . Depression Other   . Healthy Son   . Colon cancer Neg Hx   . Rectal cancer Neg Hx     Review of Systems:  As stated in the HPI and otherwise negative.   BP 136/72   Pulse (!) 45   Ht 6' (1.829 m)   Wt (!) 319 lb 3.2 oz (  144.8 kg)   SpO2 97%   BMI 43.29 kg/m   Physical Examination: General: Well developed, well nourished, NAD  HEENT: OP clear, mucus membranes moist  SKIN: warm, dry. No rashes. Neuro: No focal deficits  Musculoskeletal: Muscle strength 5/5 all ext  Psychiatric: Mood and affect normal  Neck: No JVD, no carotid bruits, no thyromegaly, no lymphadenopathy.  Lungs:Clear bilaterally, no wheezes, rhonci, crackles Cardiovascular: Regular bradycardic.  No murmurs, gallops or rubs. Abdomen:Soft. Bowel sounds present. Non-tender.  Extremities: No left lower extremity edema. Right leg amputation.   Echo 01/09/18: - Left ventricle: The cavity size was normal. Systolic function was   normal. The estimated ejection fraction was in the range of 55%   to 60%. Wall motion was normal; there were no regional wall   motion abnormalities. Doppler parameters are consistent with   abnormal left ventricular relaxation (grade 1 diastolic   dysfunction). - Aortic valve: Transvalvular velocity was within the normal range.   There was no stenosis. There was no regurgitation. - Mitral valve: Transvalvular velocity was within the normal range.   There was no evidence for stenosis. There was trivial   regurgitation. - Right ventricle: The cavity size was normal. Wall thickness was   normal. Systolic function was normal. - Tricuspid valve: There was trivial regurgitation. - Pulmonary arteries: Systolic pressure was within the normal   range. PA peak pressure: 24 mm Hg (S).  EKG:  EKG is ordered today. The ekg ordered today demonstrates Sinus bradycardia, rate 42 bpm  Recent Labs: 02/24/2020: TSH 2.52 07/23/2020: ALT 18; BUN 19;  Creatinine, Ser 1.03; Hemoglobin 13.1; Platelets 196; Potassium 4.1; Sodium 140   Lipid Panel    Component Value Date/Time   CHOL 134 02/24/2020 1613   TRIG 53.0 02/24/2020 1613   HDL 59.30 02/24/2020 1613   CHOLHDL 2 02/24/2020 1613   VLDL 10.6 02/24/2020 1613   LDLCALC 64 02/24/2020 1613     Wt Readings from Last 3 Encounters:  07/26/20 (!) 319 lb 3.2 oz (144.8 kg)  07/23/20 (!) 320 lb 11.2 oz (145.5 kg)  07/15/20 (!) 312 lb 1.6 oz (141.6 kg)     Other studies Reviewed: Additional studies/ records that were reviewed today include:  Review of the above records demonstrates:    Assessment and Plan:   1. CAD without angina: He has no chest pain. Continue ASA and statin.    2. Chronic diastolic CHF: Volume status seems to be stable. Difficult to assess given his size. No LLE edema. Continue HCTZ  3. HTN: BP is well controlled. No changes.   4. Morbid obesity: He is s/p bariatric surgery  5. Pre-operative cardiovascular risk assessment: He is limited due to his back pain and obesity. No angina, CHF or arrhythmias. He can proceed with his planned surgical procedure. He has been holding his ASA and Effient for the past 6 days.    Current medicines are reviewed at length with the patient today.  The patient does not have concerns regarding medicines.  The following changes have been made:  no change  Labs/ tests ordered today include:   Orders Placed This Encounter  Procedures  . EKG 12-Lead     Disposition:   F/U with me in one year.    Signed, Lauree Chandler, MD 07/26/2020 3:57 PM    [facility] [address], [place], [place]  [address] Phone: [phone]; Fax: [phone]

## 2020-07-27 ENCOUNTER — Encounter (HOSPITAL_COMMUNITY): Payer: Self-pay | Admitting: Specialist

## 2020-07-27 ENCOUNTER — Inpatient Hospital Stay (HOSPITAL_COMMUNITY)
Admission: RE | Admit: 2020-07-27 | Discharge: 2020-08-02 | DRG: 454 | Disposition: A | Discharge status: Skilled Nursing Facility | Payer: Medicare HMO | Attending: Specialist | Admitting: Specialist

## 2020-07-27 ENCOUNTER — Inpatient Hospital Stay (HOSPITAL_COMMUNITY): Payer: Medicare HMO | Admitting: Anesthesiology

## 2020-07-27 ENCOUNTER — Inpatient Hospital Stay (HOSPITAL_COMMUNITY): Payer: Medicare HMO | Admitting: Vascular Surgery

## 2020-07-27 ENCOUNTER — Inpatient Hospital Stay (HOSPITAL_COMMUNITY): Payer: Medicare HMO

## 2020-07-27 ENCOUNTER — Inpatient Hospital Stay (HOSPITAL_COMMUNITY)
Admission: RE | Disposition: A | Discharge status: Skilled Nursing Facility | Payer: Self-pay | Source: Home / Self Care | Attending: Specialist

## 2020-07-27 ENCOUNTER — Other Ambulatory Visit: Payer: Self-pay

## 2020-07-27 DIAGNOSIS — D124 Benign neoplasm of descending colon: Secondary | ICD-10-CM | POA: Diagnosis present

## 2020-07-27 DIAGNOSIS — R059 Cough, unspecified: Secondary | ICD-10-CM | POA: Diagnosis not present

## 2020-07-27 DIAGNOSIS — Z8249 Family history of ischemic heart disease and other diseases of the circulatory system: Secondary | ICD-10-CM

## 2020-07-27 DIAGNOSIS — Z981 Arthrodesis status: Secondary | ICD-10-CM | POA: Diagnosis not present

## 2020-07-27 DIAGNOSIS — Z89511 Acquired absence of right leg below knee: Secondary | ICD-10-CM

## 2020-07-27 DIAGNOSIS — Z96643 Presence of artificial hip joint, bilateral: Secondary | ICD-10-CM | POA: Diagnosis present

## 2020-07-27 DIAGNOSIS — Y838 Other surgical procedures as the cause of abnormal reaction of the patient, or of later complication, without mention of misadventure at the time of the procedure: Secondary | ICD-10-CM | POA: Diagnosis not present

## 2020-07-27 DIAGNOSIS — M4726 Other spondylosis with radiculopathy, lumbar region: Secondary | ICD-10-CM | POA: Diagnosis present

## 2020-07-27 DIAGNOSIS — Z803 Family history of malignant neoplasm of breast: Secondary | ICD-10-CM

## 2020-07-27 DIAGNOSIS — M48061 Spinal stenosis, lumbar region without neurogenic claudication: Principal | ICD-10-CM | POA: Diagnosis present

## 2020-07-27 DIAGNOSIS — Z818 Family history of other mental and behavioral disorders: Secondary | ICD-10-CM

## 2020-07-27 DIAGNOSIS — Z87891 Personal history of nicotine dependence: Secondary | ICD-10-CM

## 2020-07-27 DIAGNOSIS — Z833 Family history of diabetes mellitus: Secondary | ICD-10-CM

## 2020-07-27 DIAGNOSIS — Z6841 Body Mass Index (BMI) 40.0 and over, adult: Secondary | ICD-10-CM

## 2020-07-27 DIAGNOSIS — D120 Benign neoplasm of cecum: Secondary | ICD-10-CM | POA: Diagnosis present

## 2020-07-27 DIAGNOSIS — I252 Old myocardial infarction: Secondary | ICD-10-CM | POA: Diagnosis not present

## 2020-07-27 DIAGNOSIS — Q7649 Other congenital malformations of spine, not associated with scoliosis: Secondary | ICD-10-CM

## 2020-07-27 DIAGNOSIS — G9741 Accidental puncture or laceration of dura during a procedure: Secondary | ICD-10-CM | POA: Diagnosis not present

## 2020-07-27 DIAGNOSIS — Z800 Family history of malignant neoplasm of digestive organs: Secondary | ICD-10-CM | POA: Diagnosis not present

## 2020-07-27 DIAGNOSIS — D62 Acute posthemorrhagic anemia: Secondary | ICD-10-CM

## 2020-07-27 DIAGNOSIS — Z7401 Bed confinement status: Secondary | ICD-10-CM | POA: Diagnosis not present

## 2020-07-27 DIAGNOSIS — F339 Major depressive disorder, recurrent, unspecified: Secondary | ICD-10-CM | POA: Diagnosis not present

## 2020-07-27 DIAGNOSIS — Z8042 Family history of malignant neoplasm of prostate: Secondary | ICD-10-CM

## 2020-07-27 DIAGNOSIS — F411 Generalized anxiety disorder: Secondary | ICD-10-CM | POA: Diagnosis not present

## 2020-07-27 DIAGNOSIS — G4733 Obstructive sleep apnea (adult) (pediatric): Secondary | ICD-10-CM | POA: Diagnosis present

## 2020-07-27 DIAGNOSIS — Z808 Family history of malignant neoplasm of other organs or systems: Secondary | ICD-10-CM | POA: Diagnosis not present

## 2020-07-27 DIAGNOSIS — F32A Depression, unspecified: Secondary | ICD-10-CM | POA: Diagnosis present

## 2020-07-27 DIAGNOSIS — M48062 Spinal stenosis, lumbar region with neurogenic claudication: Secondary | ICD-10-CM | POA: Diagnosis not present

## 2020-07-27 DIAGNOSIS — M5136 Other intervertebral disc degeneration, lumbar region: Secondary | ICD-10-CM | POA: Diagnosis not present

## 2020-07-27 DIAGNOSIS — M1612 Unilateral primary osteoarthritis, left hip: Secondary | ICD-10-CM | POA: Diagnosis not present

## 2020-07-27 DIAGNOSIS — I10 Essential (primary) hypertension: Secondary | ICD-10-CM | POA: Diagnosis present

## 2020-07-27 DIAGNOSIS — K219 Gastro-esophageal reflux disease without esophagitis: Secondary | ICD-10-CM | POA: Diagnosis present

## 2020-07-27 DIAGNOSIS — F319 Bipolar disorder, unspecified: Secondary | ICD-10-CM

## 2020-07-27 DIAGNOSIS — M5116 Intervertebral disc disorders with radiculopathy, lumbar region: Secondary | ICD-10-CM | POA: Diagnosis not present

## 2020-07-27 DIAGNOSIS — M4802 Spinal stenosis, cervical region: Secondary | ICD-10-CM | POA: Diagnosis not present

## 2020-07-27 DIAGNOSIS — M4326 Fusion of spine, lumbar region: Secondary | ICD-10-CM | POA: Diagnosis not present

## 2020-07-27 DIAGNOSIS — E1165 Type 2 diabetes mellitus with hyperglycemia: Secondary | ICD-10-CM | POA: Diagnosis not present

## 2020-07-27 DIAGNOSIS — Z419 Encounter for procedure for purposes other than remedying health state, unspecified: Secondary | ICD-10-CM

## 2020-07-27 DIAGNOSIS — Z96653 Presence of artificial knee joint, bilateral: Secondary | ICD-10-CM | POA: Diagnosis present

## 2020-07-27 DIAGNOSIS — Z83438 Family history of other disorder of lipoprotein metabolism and other lipidemia: Secondary | ICD-10-CM

## 2020-07-27 DIAGNOSIS — Z8371 Family history of colonic polyps: Secondary | ICD-10-CM

## 2020-07-27 DIAGNOSIS — F419 Anxiety disorder, unspecified: Secondary | ICD-10-CM | POA: Diagnosis present

## 2020-07-27 DIAGNOSIS — M4316 Spondylolisthesis, lumbar region: Secondary | ICD-10-CM | POA: Diagnosis not present

## 2020-07-27 DIAGNOSIS — R058 Other specified cough: Secondary | ICD-10-CM

## 2020-07-27 DIAGNOSIS — M1990 Unspecified osteoarthritis, unspecified site: Secondary | ICD-10-CM | POA: Diagnosis present

## 2020-07-27 DIAGNOSIS — Z20822 Contact with and (suspected) exposure to covid-19: Secondary | ICD-10-CM | POA: Diagnosis not present

## 2020-07-27 DIAGNOSIS — I2510 Atherosclerotic heart disease of native coronary artery without angina pectoris: Secondary | ICD-10-CM | POA: Diagnosis not present

## 2020-07-27 DIAGNOSIS — E1151 Type 2 diabetes mellitus with diabetic peripheral angiopathy without gangrene: Secondary | ICD-10-CM | POA: Diagnosis present

## 2020-07-27 DIAGNOSIS — M2550 Pain in unspecified joint: Secondary | ICD-10-CM | POA: Diagnosis not present

## 2020-07-27 DIAGNOSIS — R6889 Other general symptoms and signs: Secondary | ICD-10-CM | POA: Diagnosis not present

## 2020-07-27 DIAGNOSIS — I959 Hypotension, unspecified: Secondary | ICD-10-CM | POA: Diagnosis not present

## 2020-07-27 LAB — POCT I-STAT, CHEM 8
BUN: 18 mg/dL (ref 8–23)
Calcium, Ion: 1.15 mmol/L (ref 1.15–1.40)
Chloride: 103 mmol/L (ref 98–111)
Creatinine, Ser: 0.8 mg/dL (ref 0.61–1.24)
Glucose, Bld: 168 mg/dL — ABNORMAL HIGH (ref 70–99)
HCT: 36 % — ABNORMAL LOW (ref 39.0–52.0)
Hemoglobin: 12.2 g/dL — ABNORMAL LOW (ref 13.0–17.0)
Potassium: 4.7 mmol/L (ref 3.5–5.1)
Sodium: 135 mmol/L (ref 135–145)
TCO2: 25 mmol/L (ref 22–32)

## 2020-07-27 LAB — GLUCOSE, CAPILLARY
Glucose-Capillary: 161 mg/dL — ABNORMAL HIGH (ref 70–99)
Glucose-Capillary: 192 mg/dL — ABNORMAL HIGH (ref 70–99)

## 2020-07-27 LAB — POCT I-STAT 7, (LYTES, BLD GAS, ICA,H+H)
Acid-Base Excess: 0 mmol/L (ref 0.0–2.0)
Bicarbonate: 26 mmol/L (ref 20.0–28.0)
Calcium, Ion: 1.18 mmol/L (ref 1.15–1.40)
HCT: 36 % — ABNORMAL LOW (ref 39.0–52.0)
Hemoglobin: 12.2 g/dL — ABNORMAL LOW (ref 13.0–17.0)
O2 Saturation: 100 %
Patient temperature: 37.1
Potassium: 4.3 mmol/L (ref 3.5–5.1)
Sodium: 137 mmol/L (ref 135–145)
TCO2: 27 mmol/L (ref 22–32)
pCO2 arterial: 48.2 mmHg — ABNORMAL HIGH (ref 32.0–48.0)
pH, Arterial: 7.34 — ABNORMAL LOW (ref 7.350–7.450)
pO2, Arterial: 279 mmHg — ABNORMAL HIGH (ref 83.0–108.0)

## 2020-07-27 SURGERY — POSTERIOR LUMBAR FUSION 3 LEVEL
Anesthesia: General | Site: Spine Lumbar

## 2020-07-27 MED ORDER — PROPOFOL 10 MG/ML IV BOLUS
INTRAVENOUS | Status: AC
  Filled 2020-07-27: qty 20

## 2020-07-27 MED ORDER — MIDAZOLAM HCL 2 MG/2ML IJ SOLN
INTRAMUSCULAR | Status: AC
  Filled 2020-07-27: qty 2

## 2020-07-27 MED ORDER — PROPOFOL 10 MG/ML IV BOLUS
INTRAVENOUS | Status: DC | PRN
  Administered 2020-07-27: 150 mg via INTRAVENOUS
  Administered 2020-07-27 (×2): 50 mg via INTRAVENOUS

## 2020-07-27 MED ORDER — CELECOXIB 200 MG PO CAPS
200.0000 mg | ORAL_CAPSULE | Freq: Two times a day (BID) | ORAL | Status: DC
  Administered 2020-07-27 – 2020-08-02 (×12): 200 mg via ORAL
  Filled 2020-07-27 (×12): qty 1

## 2020-07-27 MED ORDER — SUFENTANIL CITRATE 250 MCG/5ML IV SOLN
0.5000 ug/kg/h | INTRAVENOUS | Status: DC
  Filled 2020-07-27: qty 5

## 2020-07-27 MED ORDER — DEXTROSE 5 % IV SOLN
INTRAVENOUS | Status: DC | PRN
  Administered 2020-07-27 (×3): 3 g via INTRAVENOUS

## 2020-07-27 MED ORDER — SODIUM CHLORIDE 0.9 % IV SOLN: INTRAVENOUS | Status: DC

## 2020-07-27 MED ORDER — THROMBIN (RECOMBINANT) 20000 UNITS EX SOLR
CUTANEOUS | Status: AC
  Filled 2020-07-27: qty 20000

## 2020-07-27 MED ORDER — PREGABALIN 100 MG PO CAPS
200.0000 mg | ORAL_CAPSULE | Freq: Three times a day (TID) | ORAL | Status: DC
  Administered 2020-07-27 – 2020-08-02 (×17): 200 mg via ORAL
  Filled 2020-07-27 (×17): qty 2

## 2020-07-27 MED ORDER — MORPHINE SULFATE (PF) 2 MG/ML IV SOLN
1.0000 mg | INTRAVENOUS | Status: DC | PRN
Start: 2020-07-27 — End: 2020-08-02
  Administered 2020-07-28 – 2020-07-29 (×6): 1 mg via INTRAVENOUS
  Filled 2020-07-27 (×6): qty 1

## 2020-07-27 MED ORDER — MIRABEGRON ER 50 MG PO TB24
50.0000 mg | ORAL_TABLET | Freq: Every day | ORAL | Status: DC
  Administered 2020-07-27 – 2020-08-01 (×6): 50 mg via ORAL
  Filled 2020-07-27 (×7): qty 1

## 2020-07-27 MED ORDER — ASPIRIN 81 MG PO CHEW
81.0000 mg | CHEWABLE_TABLET | Freq: Every day | ORAL | Status: DC
  Administered 2020-07-28 – 2020-08-02 (×6): 81 mg via ORAL
  Filled 2020-07-27 (×6): qty 1

## 2020-07-27 MED ORDER — PHENTERMINE HCL 37.5 MG PO CAPS: 37.5000 mg | ORAL_CAPSULE | ORAL | Status: DC

## 2020-07-27 MED ORDER — TRANEXAMIC ACID-NACL 1000-0.7 MG/100ML-% IV SOLN
INTRAVENOUS | Status: AC
  Filled 2020-07-27: qty 100

## 2020-07-27 MED ORDER — SODIUM CHLORIDE 0.9 % IV SOLN: INTRAVENOUS | Status: DC | PRN

## 2020-07-27 MED ORDER — ALBUTEROL SULFATE (2.5 MG/3ML) 0.083% IN NEBU: 3.0000 mL | INHALATION_SOLUTION | Freq: Four times a day (QID) | RESPIRATORY_TRACT | Status: DC | PRN

## 2020-07-27 MED ORDER — ONDANSETRON HCL 4 MG/2ML IJ SOLN: 4.0000 mg | Freq: Four times a day (QID) | INTRAMUSCULAR | Status: DC | PRN

## 2020-07-27 MED ORDER — 0.9 % SODIUM CHLORIDE (POUR BTL) OPTIME
TOPICAL | Status: DC | PRN
  Administered 2020-07-27 (×2): 1000 mL

## 2020-07-27 MED ORDER — EPHEDRINE SULFATE-NACL 50-0.9 MG/10ML-% IV SOSY
PREFILLED_SYRINGE | INTRAVENOUS | Status: DC | PRN
  Administered 2020-07-27: 15 mg via INTRAVENOUS
  Administered 2020-07-27: 10 mg via INTRAVENOUS

## 2020-07-27 MED ORDER — HYDROCHLOROTHIAZIDE 25 MG PO TABS
25.0000 mg | ORAL_TABLET | Freq: Every day | ORAL | Status: DC
  Administered 2020-07-28 – 2020-08-02 (×6): 25 mg via ORAL
  Filled 2020-07-27 (×6): qty 1

## 2020-07-27 MED ORDER — LIDOCAINE 2% (20 MG/ML) 5 ML SYRINGE
INTRAMUSCULAR | Status: DC | PRN
  Administered 2020-07-27: 50 mg via INTRAVENOUS

## 2020-07-27 MED ORDER — BENZTROPINE MESYLATE 0.5 MG PO TABS
0.5000 mg | ORAL_TABLET | Freq: Every day | ORAL | Status: DC
  Administered 2020-07-27 – 2020-08-01 (×6): 0.5 mg via ORAL
  Filled 2020-07-27 (×7): qty 1

## 2020-07-27 MED ORDER — DEXAMETHASONE 4 MG PO TABS
4.0000 mg | ORAL_TABLET | Freq: Four times a day (QID) | ORAL | Status: DC
  Administered 2020-07-28 – 2020-08-02 (×21): 4 mg via ORAL
  Filled 2020-07-27 (×20): qty 1

## 2020-07-27 MED ORDER — LACTATED RINGERS IV SOLN: INTRAVENOUS | Status: DC

## 2020-07-27 MED ORDER — TAMSULOSIN HCL 0.4 MG PO CAPS
0.4000 mg | ORAL_CAPSULE | Freq: Two times a day (BID) | ORAL | Status: DC
  Administered 2020-07-27 – 2020-08-02 (×12): 0.4 mg via ORAL
  Filled 2020-07-27 (×12): qty 1

## 2020-07-27 MED ORDER — ONDANSETRON HCL 4 MG/2ML IJ SOLN
INTRAMUSCULAR | Status: DC | PRN
  Administered 2020-07-27: 4 mg via INTRAVENOUS

## 2020-07-27 MED ORDER — CEFAZOLIN SODIUM 1 G IJ SOLR
INTRAMUSCULAR | Status: AC
  Filled 2020-07-27: qty 30

## 2020-07-27 MED ORDER — ROCURONIUM BROMIDE 10 MG/ML (PF) SYRINGE
PREFILLED_SYRINGE | INTRAVENOUS | Status: DC | PRN
  Administered 2020-07-27: 70 mg via INTRAVENOUS

## 2020-07-27 MED ORDER — KETAMINE HCL 50 MG/5ML IJ SOSY
PREFILLED_SYRINGE | INTRAMUSCULAR | Status: AC
  Filled 2020-07-27: qty 15

## 2020-07-27 MED ORDER — PRASUGREL HCL 5 MG PO TABS
5.0000 mg | ORAL_TABLET | Freq: Every day | ORAL | Status: DC
  Administered 2020-07-28 – 2020-08-02 (×6): 5 mg via ORAL
  Filled 2020-07-27 (×8): qty 1

## 2020-07-27 MED ORDER — MIDAZOLAM HCL 5 MG/5ML IJ SOLN
INTRAMUSCULAR | Status: DC | PRN
  Administered 2020-07-27 (×2): 2 mg via INTRAVENOUS

## 2020-07-27 MED ORDER — SUFENTANIL CITRATE 250 MCG/5ML IV SOLN
0.2500 ug/kg/h | INTRAVENOUS | Status: DC
  Filled 2020-07-27: qty 5

## 2020-07-27 MED ORDER — ROCURONIUM BROMIDE 10 MG/ML (PF) SYRINGE
PREFILLED_SYRINGE | INTRAVENOUS | Status: AC
  Filled 2020-07-27: qty 10

## 2020-07-27 MED ORDER — VANCOMYCIN HCL 1000 MG IV SOLR
INTRAVENOUS | Status: AC
  Filled 2020-07-27: qty 1000

## 2020-07-27 MED ORDER — PHENOL 1.4 % MT LIQD: 1.0000 | OROMUCOSAL | Status: DC | PRN

## 2020-07-27 MED ORDER — OXYCODONE HCL 5 MG PO TABS
10.0000 mg | ORAL_TABLET | ORAL | Status: DC | PRN
  Administered 2020-07-27 – 2020-08-02 (×23): 10 mg via ORAL
  Filled 2020-07-27 (×23): qty 2

## 2020-07-27 MED ORDER — DEXAMETHASONE SODIUM PHOSPHATE 10 MG/ML IJ SOLN
INTRAMUSCULAR | Status: DC | PRN
  Administered 2020-07-27: 5 mg via INTRAVENOUS

## 2020-07-27 MED ORDER — CEFAZOLIN SODIUM-DEXTROSE 2-4 GM/100ML-% IV SOLN
2.0000 g | Freq: Three times a day (TID) | INTRAVENOUS | Status: AC
  Administered 2020-07-28 (×2): 2 g via INTRAVENOUS
  Filled 2020-07-27 (×2): qty 100

## 2020-07-27 MED ORDER — PROPOFOL 500 MG/50ML IV EMUL
INTRAVENOUS | Status: DC | PRN
  Administered 2020-07-27: 100 ug/kg/min via INTRAVENOUS

## 2020-07-27 MED ORDER — DEXAMETHASONE SODIUM PHOSPHATE 4 MG/ML IJ SOLN
4.0000 mg | Freq: Four times a day (QID) | INTRAMUSCULAR | Status: DC
  Administered 2020-07-28 – 2020-07-29 (×3): 4 mg via INTRAVENOUS
  Filled 2020-07-27 (×5): qty 1

## 2020-07-27 MED ORDER — OXYMETAZOLINE HCL 0.05 % NA SOLN: 1.0000 | Freq: Two times a day (BID) | NASAL | Status: DC | PRN

## 2020-07-27 MED ORDER — OMEGA-3-ACID ETHYL ESTERS 1 G PO CAPS
1.0000 g | ORAL_CAPSULE | Freq: Every day | ORAL | Status: DC
  Administered 2020-07-28 – 2020-08-02 (×6): 1 g via ORAL
  Filled 2020-07-27 (×6): qty 1

## 2020-07-27 MED ORDER — OXYCODONE HCL 5 MG PO TABS: 5.0000 mg | ORAL_TABLET | ORAL | Status: DC | PRN

## 2020-07-27 MED ORDER — POLYETHYLENE GLYCOL 3350 17 G PO PACK: 17.0000 g | PACK | Freq: Every day | ORAL | Status: DC | PRN

## 2020-07-27 MED ORDER — PHENYLEPHRINE HCL-NACL 10-0.9 MG/250ML-% IV SOLN
INTRAVENOUS | Status: DC | PRN
  Administered 2020-07-27: 40 ug/min via INTRAVENOUS

## 2020-07-27 MED ORDER — SUCCINYLCHOLINE CHLORIDE 200 MG/10ML IV SOSY
PREFILLED_SYRINGE | INTRAVENOUS | Status: AC
  Filled 2020-07-27: qty 10

## 2020-07-27 MED ORDER — TRAZODONE HCL 150 MG PO TABS
150.0000 mg | ORAL_TABLET | Freq: Every day | ORAL | Status: DC
  Administered 2020-07-27 – 2020-08-01 (×6): 150 mg via ORAL
  Filled 2020-07-27 (×6): qty 1

## 2020-07-27 MED ORDER — DULOXETINE HCL 30 MG PO CPEP
30.0000 mg | ORAL_CAPSULE | Freq: Two times a day (BID) | ORAL | Status: DC
  Administered 2020-07-27 – 2020-08-02 (×12): 30 mg via ORAL
  Filled 2020-07-27 (×12): qty 1

## 2020-07-27 MED ORDER — ONDANSETRON HCL 4 MG/2ML IJ SOLN
INTRAMUSCULAR | Status: AC
  Filled 2020-07-27: qty 2

## 2020-07-27 MED ORDER — SUCCINYLCHOLINE CHLORIDE 20 MG/ML IJ SOLN
INTRAMUSCULAR | Status: DC | PRN
  Administered 2020-07-27: 140 mg via INTRAVENOUS

## 2020-07-27 MED ORDER — PANCRELIPASE (LIP-PROT-AMYL) 36000-114000 UNITS PO CPEP
36000.0000 [IU] | ORAL_CAPSULE | ORAL | Status: DC | PRN
  Filled 2020-07-27: qty 1

## 2020-07-27 MED ORDER — ORAL CARE MOUTH RINSE: 15.0000 mL | Freq: Once | OROMUCOSAL | Status: AC

## 2020-07-27 MED ORDER — FENTANYL CITRATE (PF) 250 MCG/5ML IJ SOLN
INTRAMUSCULAR | Status: AC
  Filled 2020-07-27: qty 5

## 2020-07-27 MED ORDER — CEFAZOLIN SODIUM 1 G IJ SOLR
INTRAMUSCULAR | Status: AC
  Filled 2020-07-27: qty 20

## 2020-07-27 MED ORDER — VANCOMYCIN HCL 1000 MG IV SOLR
INTRAVENOUS | Status: DC | PRN
  Administered 2020-07-27: 1000 mg via TOPICAL

## 2020-07-27 MED ORDER — CEFAZOLIN SODIUM-DEXTROSE 2-4 GM/100ML-% IV SOLN
2.0000 g | Freq: Three times a day (TID) | INTRAVENOUS | Status: DC
  Filled 2020-07-27 (×2): qty 100

## 2020-07-27 MED ORDER — CALCIUM CARBONATE ANTACID 500 MG PO CHEW
1.0000 | CHEWABLE_TABLET | Freq: Three times a day (TID) | ORAL | Status: DC
  Administered 2020-07-28 – 2020-08-02 (×16): 200 mg via ORAL
  Filled 2020-07-27 (×17): qty 1

## 2020-07-27 MED ORDER — SODIUM CHLORIDE 0.9% FLUSH
3.0000 mL | Freq: Two times a day (BID) | INTRAVENOUS | Status: DC
  Administered 2020-07-27 – 2020-07-31 (×7): 3 mL via INTRAVENOUS

## 2020-07-27 MED ORDER — ZOLPIDEM TARTRATE 5 MG PO TABS
5.0000 mg | ORAL_TABLET | Freq: Every evening | ORAL | Status: DC | PRN
  Administered 2020-07-30 – 2020-08-01 (×2): 5 mg via ORAL
  Filled 2020-07-27 (×2): qty 1

## 2020-07-27 MED ORDER — PANCRELIPASE (LIP-PROT-AMYL) 36000-114000 UNITS PO CPEP
72000.0000 [IU] | ORAL_CAPSULE | Freq: Three times a day (TID) | ORAL | Status: DC
  Administered 2020-07-28 – 2020-08-02 (×17): 72000 [IU] via ORAL
  Filled 2020-07-27 (×19): qty 2

## 2020-07-27 MED ORDER — MENTHOL 3 MG MT LOZG: 1.0000 | LOZENGE | OROMUCOSAL | Status: DC | PRN

## 2020-07-27 MED ORDER — POLYSACCHARIDE IRON COMPLEX 150 MG PO CAPS
150.0000 mg | ORAL_CAPSULE | Freq: Every day | ORAL | Status: DC
  Administered 2020-07-27 – 2020-07-31 (×5): 150 mg via ORAL
  Filled 2020-07-27 (×5): qty 1

## 2020-07-27 MED ORDER — PHENYLEPHRINE HCL-NACL 10-0.9 MG/250ML-% IV SOLN
INTRAVENOUS | Status: AC
  Filled 2020-07-27: qty 250

## 2020-07-27 MED ORDER — BUPIVACAINE HCL 0.5 % IJ SOLN
INTRAMUSCULAR | Status: DC | PRN
  Administered 2020-07-27: 30 mL

## 2020-07-27 MED ORDER — KETAMINE HCL 10 MG/ML IJ SOLN
INTRAMUSCULAR | Status: DC | PRN
  Administered 2020-07-27: 20 mg via INTRAVENOUS
  Administered 2020-07-27: 50 mg via INTRAVENOUS
  Administered 2020-07-27 (×8): 10 mg via INTRAVENOUS

## 2020-07-27 MED ORDER — SUFENTANIL CITRATE 250 MCG/5ML IV SOLN
0.2500 ug/kg/h | INTRAVENOUS | Status: AC
  Administered 2020-07-27: .5 ug/kg/h via INTRAVENOUS
  Filled 2020-07-27: qty 5

## 2020-07-27 MED ORDER — OXYBUTYNIN CHLORIDE ER 10 MG PO TB24
10.0000 mg | ORAL_TABLET | Freq: Every day | ORAL | Status: DC
Start: 2020-07-27 — End: 2020-08-02
  Administered 2020-07-27 – 2020-08-02 (×7): 10 mg via ORAL
  Filled 2020-07-27 (×7): qty 1

## 2020-07-27 MED ORDER — FENTANYL CITRATE (PF) 250 MCG/5ML IJ SOLN
INTRAMUSCULAR | Status: DC | PRN
  Administered 2020-07-27: 50 ug via INTRAVENOUS
  Administered 2020-07-27: 100 ug via INTRAVENOUS

## 2020-07-27 MED ORDER — ONDANSETRON HCL 4 MG PO TABS: 4.0000 mg | ORAL_TABLET | Freq: Four times a day (QID) | ORAL | Status: DC | PRN

## 2020-07-27 MED ORDER — PANTOPRAZOLE SODIUM 40 MG IV SOLR: 40.0000 mg | Freq: Every day | INTRAVENOUS | Status: DC

## 2020-07-27 MED ORDER — FENTANYL CITRATE (PF) 100 MCG/2ML IJ SOLN: INTRAMUSCULAR | Status: DC | PRN

## 2020-07-27 MED ORDER — ALBUMIN HUMAN 5 % IV SOLN: INTRAVENOUS | Status: DC | PRN

## 2020-07-27 MED ORDER — METHOCARBAMOL 1000 MG/10ML IJ SOLN
500.0000 mg | Freq: Four times a day (QID) | INTRAVENOUS | Status: DC | PRN
  Filled 2020-07-27: qty 5

## 2020-07-27 MED ORDER — SODIUM CHLORIDE 0.9% FLUSH: 3.0000 mL | INTRAVENOUS | Status: DC | PRN

## 2020-07-27 MED ORDER — DOCUSATE SODIUM 100 MG PO CAPS
100.0000 mg | ORAL_CAPSULE | Freq: Two times a day (BID) | ORAL | Status: DC
  Administered 2020-07-27 – 2020-07-31 (×6): 100 mg via ORAL
  Filled 2020-07-27 (×9): qty 1

## 2020-07-27 MED ORDER — GLYCOPYRROLATE PF 0.2 MG/ML IJ SOSY
PREFILLED_SYRINGE | INTRAMUSCULAR | Status: DC | PRN
  Administered 2020-07-27: .2 mg via INTRAVENOUS

## 2020-07-27 MED ORDER — ARIPIPRAZOLE 10 MG PO TABS
10.0000 mg | ORAL_TABLET | Freq: Every day | ORAL | Status: DC
  Administered 2020-07-28 – 2020-08-02 (×6): 10 mg via ORAL
  Filled 2020-07-27 (×6): qty 1

## 2020-07-27 MED ORDER — SUFENTANIL CITRATE 50 MCG/ML IV SOLN
INTRAVENOUS | Status: DC | PRN
  Administered 2020-07-27: 15 ug via INTRAVENOUS

## 2020-07-27 MED ORDER — LIDOCAINE 2% (20 MG/ML) 5 ML SYRINGE
INTRAMUSCULAR | Status: AC
  Filled 2020-07-27: qty 5

## 2020-07-27 MED ORDER — ALUM & MAG HYDROXIDE-SIMETH 200-200-20 MG/5ML PO SUSP: 30.0000 mL | Freq: Four times a day (QID) | ORAL | Status: DC | PRN

## 2020-07-27 MED ORDER — THROMBIN 20000 UNITS EX SOLR
CUTANEOUS | Status: DC | PRN
  Administered 2020-07-27: 20 mL via TOPICAL

## 2020-07-27 MED ORDER — BUPIVACAINE HCL (PF) 0.5 % IJ SOLN
INTRAMUSCULAR | Status: AC
  Filled 2020-07-27: qty 30

## 2020-07-27 MED ORDER — SODIUM CHLORIDE 0.9 % IV SOLN
250.0000 mL | INTRAVENOUS | Status: DC
  Administered 2020-07-28 – 2020-07-29 (×2): 250 mL via INTRAVENOUS

## 2020-07-27 MED ORDER — ACETAMINOPHEN 650 MG RE SUPP: 650.0000 mg | RECTAL | Status: DC | PRN

## 2020-07-27 MED ORDER — LACTATED RINGERS IV SOLN
INTRAVENOUS | Status: DC | PRN
Start: 2020-07-27 — End: 2020-07-27

## 2020-07-27 MED ORDER — CHLORHEXIDINE GLUCONATE 0.12 % MT SOLN
15.0000 mL | Freq: Once | OROMUCOSAL | Status: AC
  Administered 2020-07-27: 15 mL via OROMUCOSAL
  Filled 2020-07-27: qty 15

## 2020-07-27 MED ORDER — ACETAMINOPHEN 325 MG PO TABS: 650.0000 mg | ORAL_TABLET | ORAL | Status: DC | PRN

## 2020-07-27 MED ORDER — METHOCARBAMOL 500 MG PO TABS
500.0000 mg | ORAL_TABLET | Freq: Four times a day (QID) | ORAL | Status: DC | PRN
  Administered 2020-07-28 – 2020-08-02 (×10): 500 mg via ORAL
  Filled 2020-07-27 (×11): qty 1

## 2020-07-27 MED ORDER — PANTOPRAZOLE SODIUM 40 MG PO TBEC
40.0000 mg | DELAYED_RELEASE_TABLET | Freq: Two times a day (BID) | ORAL | Status: DC
  Administered 2020-07-27 – 2020-08-02 (×12): 40 mg via ORAL
  Filled 2020-07-27 (×12): qty 1

## 2020-07-27 MED ORDER — BISACODYL 5 MG PO TBEC: 5.0000 mg | DELAYED_RELEASE_TABLET | Freq: Every day | ORAL | Status: DC | PRN

## 2020-07-27 MED ORDER — BUPRENORPHINE 10 MCG/HR TD PTWK: 2.0000 | MEDICATED_PATCH | TRANSDERMAL | Status: DC

## 2020-07-27 MED ORDER — TESTOSTERONE 1.62 % TD GEL: 1.0000 | Freq: Every day | TRANSDERMAL | Status: DC

## 2020-07-27 MED ORDER — FLEET ENEMA 7-19 GM/118ML RE ENEM: 1.0000 | ENEMA | Freq: Once | RECTAL | Status: DC | PRN

## 2020-07-27 MED ORDER — MELATONIN 5 MG PO TABS
15.0000 mg | ORAL_TABLET | Freq: Every day | ORAL | Status: DC
  Administered 2020-07-27 – 2020-08-01 (×6): 15 mg via ORAL
  Filled 2020-07-27 (×6): qty 3

## 2020-07-27 SURGICAL SUPPLY — 98 items
ADH SKN CLS APL DERMABOND .7 (GAUZE/BANDAGES/DRESSINGS)
AGENT HMST KT MTR STRL THRMB (HEMOSTASIS)
BIT DRILL NEURO 2X3.1 SFT TUCH (MISCELLANEOUS) IMPLANT
BLADE CLIPPER SURG (BLADE) ×1 IMPLANT
BONE CANC CHIPS 20CC PCAN1/4 (Bone Implant) ×2 IMPLANT
BONE VIVIGEN FORMABLE 10CC (Bone Implant) ×4 IMPLANT
BUR MATCHSTICK NEURO 3.0 LAGG (BURR) ×2 IMPLANT
BUR NEURO DRILL SOFT 3.0X3.8M (BURR) IMPLANT
BUR PRESCISION 1.7 ELITE (BURR) ×1 IMPLANT
BUR RND FLUTED 2.5 (BURR) ×1 IMPLANT
CAP LOCKING THREADED (Cap) ×14 IMPLANT
CATH COUDE FOLEY 5CC 14FR (CATHETERS) ×2 IMPLANT
CHIPS CANC BONE 20CC PCAN1/4 (Bone Implant) ×1 IMPLANT
CNTNR URN SCR LID CUP LEK RST (MISCELLANEOUS) IMPLANT
CONT SPEC 4OZ STRL OR WHT (MISCELLANEOUS) ×2
COVER BACK TABLE 80X110 HD (DRAPES) ×2 IMPLANT
COVER MAYO STAND STRL (DRAPES) IMPLANT
COVER SURGICAL LIGHT HANDLE (MISCELLANEOUS) ×2 IMPLANT
COVER WAND RF STERILE (DRAPES) ×2 IMPLANT
DERMABOND ADVANCED (GAUZE/BANDAGES/DRESSINGS)
DERMABOND ADVANCED .7 DNX12 (GAUZE/BANDAGES/DRESSINGS) ×1 IMPLANT
DRAPE C-ARM 42X72 X-RAY (DRAPES) ×2 IMPLANT
DRAPE C-ARMOR (DRAPES) ×2 IMPLANT
DRAPE MICROSCOPE LEICA (MISCELLANEOUS) ×2 IMPLANT
DRAPE POUCH INSTRU U-SHP 10X18 (DRAPES) ×2 IMPLANT
DRAPE SURG 17X23 STRL (DRAPES) ×8 IMPLANT
DRAPE U-SHAPE 47X51 STRL (DRAPES) ×1 IMPLANT
DRILL NEURO 2X3.1 SOFT TOUCH (MISCELLANEOUS) ×2
DRSG MEPILEX BORDER 4X8 (GAUZE/BANDAGES/DRESSINGS) ×1 IMPLANT
DRSG PAD ABDOMINAL 8X10 ST (GAUZE/BANDAGES/DRESSINGS) ×1 IMPLANT
DURAPREP 26ML APPLICATOR (WOUND CARE) ×2 IMPLANT
DURASEAL SPINE SEALANT 3ML (MISCELLANEOUS) ×1 IMPLANT
ELECT BLADE 4.0 EZ CLEAN MEGAD (MISCELLANEOUS) ×2
ELECT BLADE 6.5 EXT (BLADE) IMPLANT
ELECT CAUTERY BLADE 6.4 (BLADE) ×2 IMPLANT
ELECT REM PT RETURN 9FT ADLT (ELECTROSURGICAL) ×2
ELECTRODE BLDE 4.0 EZ CLN MEGD (MISCELLANEOUS) ×1 IMPLANT
ELECTRODE REM PT RTRN 9FT ADLT (ELECTROSURGICAL) ×1 IMPLANT
EVACUATOR 1/8 PVC DRAIN (DRAIN) IMPLANT
FEE INTRAOP MONITOR IMPULS NCS (MISCELLANEOUS) IMPLANT
GAUZE SPONGE 4X4 12PLY STRL (GAUZE/BANDAGES/DRESSINGS) ×1 IMPLANT
GAUZE XEROFORM 5X9 LF (GAUZE/BANDAGES/DRESSINGS) ×1 IMPLANT
GLOVE BIOGEL PI ORTHO PRO 7.5 (GLOVE) ×3
GLOVE ECLIPSE 9.0 STRL (GLOVE) ×4 IMPLANT
GLOVE ORTHO TXT STRL SZ7.5 (GLOVE) ×2 IMPLANT
GLOVE PI ORTHO PRO STRL 7.5 (GLOVE) IMPLANT
GLOVE SRG 8 PF TXTR STRL LF DI (GLOVE) ×1 IMPLANT
GLOVE SURG 8.5 LATEX PF (GLOVE) ×3 IMPLANT
GLOVE SURG UNDER POLY LF SZ8 (GLOVE) ×2
GOWN STRL REUS W/ TWL LRG LVL3 (GOWN DISPOSABLE) ×1 IMPLANT
GOWN STRL REUS W/TWL 2XL LVL3 (GOWN DISPOSABLE) ×6 IMPLANT
GOWN STRL REUS W/TWL LRG LVL3 (GOWN DISPOSABLE) ×4
GRAFT BNE CANC CHIPS 1-8 20CC (Bone Implant) IMPLANT
GRAFT BNE MATRIX VG FRMBL L 10 (Bone Implant) IMPLANT
INTRAOP MONITOR FEE IMPULS NCS (MISCELLANEOUS) ×1
INTRAOP MONITOR FEE IMPULSE (MISCELLANEOUS) ×2
KIT BASIN OR (CUSTOM PROCEDURE TRAY) ×2 IMPLANT
KIT POSITION SURG JACKSON T1 (MISCELLANEOUS) ×2 IMPLANT
KIT TURNOVER KIT B (KITS) ×2 IMPLANT
NDL SPNL 18GX3.5 QUINCKE PK (NEEDLE) ×1 IMPLANT
NEEDLE 22X1 1/2 (OR ONLY) (NEEDLE) ×2 IMPLANT
NEEDLE SPNL 18GX3.5 QUINCKE PK (NEEDLE) ×2 IMPLANT
NS IRRIG 1000ML POUR BTL (IV SOLUTION) ×2 IMPLANT
PACK LAMINECTOMY ORTHO (CUSTOM PROCEDURE TRAY) ×2 IMPLANT
PAD ARMBOARD 7.5X6 YLW CONV (MISCELLANEOUS) ×4 IMPLANT
PATTIES SURGICAL .75X.75 (GAUZE/BANDAGES/DRESSINGS) ×2 IMPLANT
PATTIES SURGICAL 1X1 (DISPOSABLE) ×2 IMPLANT
PROBE PED SCREW MONOP 3 BT NCS (MISCELLANEOUS) IMPLANT
ROD SPINE CVD CREO 5.5X150 (Rod) ×2 IMPLANT
SCREW AMP MOD CREO 6.5X60 (Screw) ×5 IMPLANT
SCREW AMP MOD CREO 7.5X55 (Screw) ×1 IMPLANT
SCREW CREO 7.5X40MM (Screw) ×1 IMPLANT
SCREW PA THRD CREO TULIP 5.5X4 (Head) ×7 IMPLANT
SCREW PROBE PEDICLE MONOPOLAR (MISCELLANEOUS) ×2
SCREW SPINAL CREO 7.5X40 (Screw) ×2 IMPLANT
SCREW THRD CREO 6.5X60 (Screw) ×3 IMPLANT
SCREW THRD CREO 7.5X55 F/5.5 (Screw) ×1 IMPLANT
SPONGE LAP 4X18 RFD (DISPOSABLE) ×5 IMPLANT
SPONGE SURGIFOAM ABS GEL 100 (HEMOSTASIS) ×2 IMPLANT
STAPLER VISISTAT 35W (STAPLE) ×1 IMPLANT
SURGIFLO W/THROMBIN 8M KIT (HEMOSTASIS) ×1 IMPLANT
SUT VIC AB 0 CT1 27 (SUTURE) ×4
SUT VIC AB 0 CT1 27XBRD ANBCTR (SUTURE) ×1 IMPLANT
SUT VIC AB 1 CTX 36 (SUTURE) ×8
SUT VIC AB 1 CTX36XBRD ANBCTR (SUTURE) ×1 IMPLANT
SUT VIC AB 2-0 CT1 27 (SUTURE) ×2
SUT VIC AB 2-0 CT1 TAPERPNT 27 (SUTURE) ×1 IMPLANT
SUT VIC AB 3-0 X1 27 (SUTURE) ×2 IMPLANT
SYR 20ML LL LF (SYRINGE) ×2 IMPLANT
SYR CONTROL 10ML LL (SYRINGE) ×4 IMPLANT
TAP SURG AMP CREO 4.5 (TAP) ×1 IMPLANT
TAP SURG AMP CREO 5.5 (TAP) ×1 IMPLANT
TAP SURG AMP CREO 6.5 (TAP) ×1 IMPLANT
TOWEL GREEN STERILE (TOWEL DISPOSABLE) ×2 IMPLANT
TOWEL GREEN STERILE FF (TOWEL DISPOSABLE) ×2 IMPLANT
TRAY FOLEY MTR SLVR 16FR STAT (SET/KITS/TRAYS/PACK) ×2 IMPLANT
WATER STERILE IRR 1000ML POUR (IV SOLUTION) ×2 IMPLANT
YANKAUER SUCT BULB TIP NO VENT (SUCTIONS) ×2 IMPLANT

## 2020-07-27 NOTE — Interval H&P Note (Signed)
History and Physical Interval Note:  07/27/2020 7:42 AM  Travis Novak Sr.  has presented today for surgery, with the diagnosis of multiple level lumbar spinal stenosis, lumbar degenerative disc disease, lumbar spondylosis with radiculopathy, lumbar spondylolisthesis.  The various methods of treatment have been discussed with the patient and family. After consideration of risks, benefits and other options for treatment, the patient has consented to  Procedure(s): RIGHT L1-2, LEFT L2-3, L3-4, L4-5 AND L5-S1 TRANSFORAMINAL LUMBAR INTERBODY FUSIONS, MPACT PEDICLE SCREWS AND RODS, CAGES L1-S1, LOCAL BONE GRAFT, ALLOGRAFT CANCELLOUS CHIPS, VIVIGEN (N/A) as a surgical intervention.  The patient's history has been reviewed, patient examined, no change in status, stable for surgery.  I have reviewed the patient's chart and labs.  Questions were answered to the patient's satisfaction.     [name]

## 2020-07-27 NOTE — Anesthesia Procedure Notes (Signed)
Procedure Name: Intubation Date/Time: 07/27/2020 8:09 AM Performed by: Hoy Morn, CRNA Pre-anesthesia Checklist: Patient identified, Emergency Drugs available, Suction available and Patient being monitored Patient Re-evaluated:Patient Re-evaluated prior to induction Oxygen Delivery Method: Circle system utilized Preoxygenation: Pre-oxygenation with 100% oxygen Induction Type: IV induction Ventilation: Mask ventilation without difficulty Laryngoscope Size: Miller and 2 Grade View: Grade I Tube type: Oral Tube size: 7.5 mm Number of attempts: 1 Airway Equipment and Method: Stylet and Oral airway Placement Confirmation: ETT inserted through vocal cords under direct vision,  positive ETCO2 and breath sounds checked- equal and bilateral Tube secured with: Tape Dental Injury: Teeth and Oropharynx as per pre-operative assessment

## 2020-07-27 NOTE — Progress Notes (Signed)
Orthopedic Tech Progress Note Patient Details:  Travis [name] 1950/11/05 [id number] Ordered patient TLSO Patient ID: Travis Dyer., male   DOB: 10-May-1951, 70 y.o.   MRN: [id number]   Travis [name] 07/27/2020, 8:00 PM

## 2020-07-27 NOTE — Anesthesia Procedure Notes (Signed)
Arterial Line Insertion Start/End3/12/2020 7:10 AM, 07/27/2020 7:15 AM Performed by: Roderic Palau, MD, Trinna Post., CRNA, CRNA  Patient location: Pre-op. Preanesthetic checklist: patient identified, IV checked, site marked, risks and benefits discussed, surgical consent, monitors and equipment checked, pre-op evaluation, timeout performed and anesthesia consent Lidocaine 1% used for infiltration Right, radial was placed Catheter size: 20 G Hand hygiene performed , maximum sterile barriers used  and Seldinger technique used Allen's test indicative of satisfactory collateral circulation Attempts: 1 Procedure performed without using ultrasound guided technique. Following insertion, dressing applied. Post procedure assessment: normal  Patient tolerated the procedure well with no immediate complications.

## 2020-07-27 NOTE — Transfer of Care (Signed)
Immediate Anesthesia Transfer of Care Note  Patient: Travis CORIGLIANO Sr.  Procedure(s) Performed: RIGHT L1-2, LEFT L2-3, L3-4, L4-5 AND L5-S1 TRANSFORAMINAL LUMBAR INTERBODY FUSIONS, MPACT PEDICLE SCREWS AND RODS, CAGES L1-S1, LOCAL BONE GRAFT, ALLOGRAFT CANCELLOUS CHIPS, VIVIGEN (N/A Spine Lumbar)  Patient Location: PACU  Anesthesia Type:General  Level of Consciousness: drowsy and patient cooperative  Airway & Oxygen Therapy: Patient Spontanous Breathing and Patient connected to nasal cannula oxygen  Post-op Assessment: Report given to RN, Post -op Vital signs reviewed and stable and Patient moving all extremities X 4  Post vital signs: Reviewed and stable  Last Vitals:  Vitals Value Taken Time  BP    Temp    Pulse    Resp    SpO2      Last Pain:  Vitals:   07/27/20 0623  TempSrc: Oral  PainSc: 8       Patients Stated Pain Goal: 5 (20/91/06 8166)  Complications: No complications documented.

## 2020-07-27 NOTE — Brief Op Note (Signed)
07/27/2020  6:40 PM  PATIENT:  Travis Novak Sr.  70 y.o. male  PRE-OPERATIVE DIAGNOSIS:  multiple level lumbar spinal stenosis, lumbar degenerative disc disease, lumbar spondylosis with radiculopathy, lumbar spondylolisthesis  POST-OPERATIVE DIAGNOSIS:  multiple level lumbar spinal stenosis, lumbar degenerative disc disease, lumbar spondylosis with radiculopathy, lumbar spondylolisthesis  PROCEDURE:  Procedure(s): RIGHT L1-2, LEFT L2-3, L3-4, L4-5 AND L5-S1 Posterior LUMBAR  FUSIONS, MPACT PEDICLE SCREWS AND RODS, CAGES L1-S1, LOCAL BONE GRAFT, ALLOGRAFT CANCELLOUS CHIPS, VIVIGEN (N/A) SURGEON:  Surgeon(s) and Role:    * Jessy Oto, MD - Primary  PHYSICIAN ASSISTANT: Benjiman Core PA-C  ANESTHESIA:   local and general  EBL:  3050 mL   BLOOD ADMINISTERED:1100 CC PRBC  DRAINS: Urinary Catheter (Foley)   LOCAL MEDICATIONS USED:  MARCAINE0.5% 1:1 EXPAREL 1.3%    and Amount: 20 ml  SPECIMEN:  No Specimen  DISPOSITION OF SPECIMEN:  N/A  COUNTS:  YES  TOURNIQUET:  * No tourniquets in log *  DICTATION: .Dragon Dictation  PLAN OF CARE: Admit to inpatient   PATIENT DISPOSITION:  PACU - hemodynamically stable.   Delay start of Pharmacological VTE agent (>24hrs) due to surgical blood loss or risk of bleeding: yes

## 2020-07-27 NOTE — Interval H&P Note (Signed)
History and Physical Interval Note:  07/27/2020 7:45 AM  Travis Novak Sr.  has presented today for surgery, with the diagnosis of multiple level lumbar spinal stenosis, lumbar degenerative disc disease, lumbar spondylosis with radiculopathy, lumbar spondylolisthesis.  The various methods of treatment have been discussed with the patient and family. After consideration of risks, benefits and other options for treatment, the patient has consented to  Procedure(s): RIGHT L1-2, LEFT L2-3, L3-4, L4-5 AND L5-S1 TRANSFORAMINAL LUMBAR INTERBODY FUSIONS, MPACT PEDICLE SCREWS AND RODS, CAGES L1-S1, LOCAL BONE GRAFT, ALLOGRAFT CANCELLOUS CHIPS, VIVIGEN (N/A) as a surgical intervention.  The patient's history has been reviewed, patient examined, no change in status, stable for surgery.  I have reviewed the patient's chart and labs.  Questions were answered to the patient's satisfaction.     [name]

## 2020-07-28 ENCOUNTER — Encounter (HOSPITAL_COMMUNITY): Payer: Self-pay | Admitting: Specialist

## 2020-07-28 DIAGNOSIS — M4316 Spondylolisthesis, lumbar region: Secondary | ICD-10-CM

## 2020-07-28 DIAGNOSIS — M4726 Other spondylosis with radiculopathy, lumbar region: Secondary | ICD-10-CM

## 2020-07-28 DIAGNOSIS — Q7649 Other congenital malformations of spine, not associated with scoliosis: Secondary | ICD-10-CM

## 2020-07-28 DIAGNOSIS — M5136 Other intervertebral disc degeneration, lumbar region: Secondary | ICD-10-CM

## 2020-07-28 DIAGNOSIS — M48062 Spinal stenosis, lumbar region with neurogenic claudication: Secondary | ICD-10-CM

## 2020-07-28 LAB — POCT I-STAT 7, (LYTES, BLD GAS, ICA,H+H)
Acid-Base Excess: 2 mmol/L (ref 0.0–2.0)
Bicarbonate: 28.6 mmol/L — ABNORMAL HIGH (ref 20.0–28.0)
Calcium, Ion: 1.24 mmol/L (ref 1.15–1.40)
HCT: 38 % — ABNORMAL LOW (ref 39.0–52.0)
Hemoglobin: 12.9 g/dL — ABNORMAL LOW (ref 13.0–17.0)
O2 Saturation: 100 %
Potassium: 4.1 mmol/L (ref 3.5–5.1)
Sodium: 136 mmol/L (ref 135–145)
TCO2: 30 mmol/L (ref 22–32)
pCO2 arterial: 53.1 mmHg — ABNORMAL HIGH (ref 32.0–48.0)
pH, Arterial: 7.34 — ABNORMAL LOW (ref 7.350–7.450)
pO2, Arterial: 198 mmHg — ABNORMAL HIGH (ref 83.0–108.0)

## 2020-07-28 LAB — BASIC METABOLIC PANEL
Anion gap: 8 (ref 5–15)
BUN: 18 mg/dL (ref 8–23)
CO2: 22 mmol/L (ref 22–32)
Calcium: 7.9 mg/dL — ABNORMAL LOW (ref 8.9–10.3)
Chloride: 104 mmol/L (ref 98–111)
Creatinine, Ser: 1.02 mg/dL (ref 0.61–1.24)
GFR, Estimated: >60 mL/min (ref >60)
Glucose, Bld: 202 mg/dL — ABNORMAL HIGH (ref 70–99)
Potassium: 4.3 mmol/L (ref 3.5–5.1)
Sodium: 134 mmol/L — ABNORMAL LOW (ref 135–145)

## 2020-07-28 LAB — CBC
HCT: 39.9 % (ref 39.0–52.0)
Hemoglobin: 14.2 g/dL (ref 13.0–17.0)
MCH: 31.5 pg (ref 26.0–34.0)
MCHC: 35.6 g/dL (ref 30.0–36.0)
MCV: 88.5 fL (ref 80.0–100.0)
Platelets: 197 10*3/uL (ref 150–400)
RBC: 4.51 MIL/uL (ref 4.22–5.81)
RDW: 12.1 % (ref 11.5–15.5)
WBC: 14.1 10*3/uL — ABNORMAL HIGH (ref 4.0–10.5)
nRBC: 0 % (ref 0.0–0.2)

## 2020-07-28 LAB — GLUCOSE, CAPILLARY
Glucose-Capillary: 177 mg/dL — ABNORMAL HIGH (ref 70–99)
Glucose-Capillary: 211 mg/dL — ABNORMAL HIGH (ref 70–99)
Glucose-Capillary: 170 mg/dL — ABNORMAL HIGH (ref 70–99)
Glucose-Capillary: 191 mg/dL — ABNORMAL HIGH (ref 70–99)

## 2020-07-28 MED FILL — Thrombin (Recombinant) For Soln 20000 Unit: CUTANEOUS | Qty: 1 | Status: AC

## 2020-07-28 NOTE — Progress Notes (Signed)
     Subjective: 1 Day Post-Op Procedure(s) (LRB): RIGHT L1-2, LEFT L2-3, L3-4, L4-5 AND L5-S1 TRANSFORAMINAL LUMBAR INTERBODY FUSIONS, MPACT PEDICLE SCREWS AND RODS, CAGES L1-S1, LOCAL BONE GRAFT, ALLOGRAFT CANCELLOUS CHIPS, VIVIGEN (N/A) Awake. Alert and oriented x 4. Foley removed today and he has voided.  Patient reports pain as moderate.    Objective:   VITALS:  Temp:  [97.2 F (36.2 C)-99.5 F (37.5 C)] 98.3 F (36.8 C) (03/09 1449) Pulse Rate:  [86-93] 86 (03/09 1449) Resp:  [13-22] 13 (03/09 1449) BP: (135-166)/(75-92) 147/76 (03/09 1449) SpO2:  [92 %-97 %] 95 % (03/09 1449) Arterial Line BP: (152-164)/(56-62) 152/56 (03/08 1928)  Neurologically intact ABD soft Neurovascular intact Sensation intact distally Intact pulses distally Dorsiflexion/Plantar flexion intact Incision: dressing C/D/I and no drainage   LABS Recent Labs    07/27/20 1238 07/27/20 1535 07/27/20 1819 07/28/20 0403  HGB 12.9* 12.2* 12.2* 14.2  WBC  --   --   --  14.1*  PLT  --   --   --  197   Recent Labs    07/27/20 1819 07/28/20 0403  NA 135 134*  K 4.7 4.3  CL 103 104  CO2  --  22  BUN 18 18  CREATININE 0.80 1.02  GLUCOSE 168* 202*   No results for input(s): LABPT, INR in the last 72 hours.   Assessment/Plan: 1 Day Post-Op Procedure(s) (LRB): RIGHT L1-2, LEFT L2-3, L3-4, L4-5 AND L5-S1 TRANSFORAMINAL LUMBAR INTERBODY FUSIONS, MPACT PEDICLE SCREWS AND RODS, CAGES L1-S1, LOCAL BONE GRAFT, ALLOGRAFT CANCELLOUS CHIPS, VIVIGEN (N/A)  Advance diet Up with therapy D/C IV fluids  Continue with PT and OT. Change dressing tomorrow, due to use of vancomycin powder may see some drainage.  [name] 07/28/2020, 7:05 PMPatient ID: Travis Dyer., male   DOB: 02-11-1951, 70 y.o.   MRN: [id number]

## 2020-07-28 NOTE — Plan of Care (Signed)

## 2020-07-28 NOTE — Progress Notes (Signed)
07/28/20 1358  PT Visit Information  Last PT Received On 07/28/20  Assistance Needed +2  PT/OT/SLP Co-Evaluation/Treatment Yes  Reason for Co-Treatment For patient/therapist safety;To address functional/ADL transfers  PT goals addressed during session Mobility/safety with mobility;Balance  History of Present Illness This 70 y.o. male admitted 3/8 for L1- S1 posterior Lumbar fusion.  PMH includes: Rt BKA, anxiety, CAD, h/o MI, chronic pain syndrome, depression, fybromyalgia, h/o hepatitis C, PNS, bilateral rotator cuff tear arthropathy, DM, OSA, urethral strciture - self catheterizes, s/p ACDF, CTR, gastric bypass, bilateral THA 1/21), s/p bilateral TKA  Precautions  Precautions Back  Precaution Booklet Issued No  Precaution Comments verbally reviewed back precautions with pt  Required Braces or Orthoses Spinal Brace  Spinal Brace Other (comment) (TLSO; clam shell)  Spinal Brace Comments Pt awaiting customized clam shell TLSO  Restrictions  Weight Bearing Restrictions No  Home Living  Family/patient expects to be discharged to: Private residence  Living Arrangements Other relatives  Available Help at Discharge Family  Type of [place] to enter  Entrance Stairs-Number of Steps 4  Entrance Stairs-Rails Left;Right;Can reach both  [place] One level  Bathroom Shower/Tub Tub/shower unit  Tax adviser - 2 wheels;Cane - single point  Additional Comments Has extra prosthetic for showering.  He reports he lives with his niece and her teenage sons  Prior Function  Level of Independence Independent with assistive device(s)  Comments Pt reports he was mod I with ADLs PTA using SPC  Communication  Communication No difficulties  Pain Assessment  Pain Assessment 0-10  Pain Score 10  Pain Location back  Pain Descriptors / Indicators Operative site guarding;Aching;Throbbing  Pain Intervention(s) Limited activity within  patient's tolerance;Monitored during session;Repositioned  Cognition  Arousal/Alertness Awake/alert  Behavior During Therapy WFL for tasks assessed/performed  Overall Cognitive Status Within Functional Limits for tasks assessed  Upper Extremity Assessment  Upper Extremity Assessment Defer to OT evaluation  Lower Extremity Assessment  Lower Extremity Assessment RLE deficits/detail  RLE Deficits / Details R BKA at baseline  Cervical / Trunk Assessment  Cervical / Trunk Assessment Other exceptions  Cervical / Trunk Exceptions obese, scoliosis, L1-S1 fusion  Bed Mobility  Overal bed mobility Needs Assistance  Bed Mobility Rolling;Sidelying to Sit;Sit to Sidelying  Rolling +2 for physical assistance;+2 for safety/equipment;Total assist  Sidelying to sit +2 for physical assistance;+2 for safety/equipment;Total assist  Sit to sidelying Total assist;+2 for physical assistance;+2 for safety/equipment  General bed mobility comments Pt instructed in log rolling technique.  He assisted minimally requiring total assist for all aspects.  Transfers  General transfer comment unable to safely attempt  Balance  Overall balance assessment Needs assistance  Sitting-balance support Feet supported;Single extremity supported;Bilateral upper extremity supported  Sitting balance-Leahy Scale Poor  Sitting balance - Comments Pt with left lateral lean, initially requiring mod A to maintain EOB sitting. He was able to progress to min guard assist after  2 mins and verbal cues  General Comments  General comments (skin integrity, edema, etc.) Pt awaiting new TLSO as the hardshell TLSO is causing increased pain  PT - End of Session  Activity Tolerance Patient limited by pain  Patient left in bed;with call bell/phone within reach;with bed alarm set;with nursing/sitter in room  Nurse Communication Mobility status;Patient requests pain meds  PT Assessment  PT Recommendation/Assessment Patient needs continued PT  services  PT Visit Diagnosis Unsteadiness on feet (R26.81);Muscle weakness (generalized) (M62.81);Difficulty in walking, not elsewhere  classified (R26.2);Pain  Pain - part of body  (back)  PT Problem List Decreased range of motion;Decreased strength;Decreased activity tolerance;Decreased balance;Decreased mobility;Decreased knowledge of use of DME;Decreased knowledge of precautions;Pain  PT Plan  PT Frequency (ACUTE ONLY) Min 5X/week  PT Treatment/Interventions (ACUTE ONLY) DME instruction;Gait training;Functional mobility training;Therapeutic exercise;Therapeutic activities;Balance training;Stair training;Patient/family education  AM-PAC PT "6 Clicks" Mobility Outcome Measure (Version 2)  Help needed turning from your back to your side while in a flat bed without using bedrails? 1  Help needed moving from lying on your back to sitting on the side of a flat bed without using bedrails? 1  Help needed moving to and from a bed to a chair (including a wheelchair)? 1  Help needed standing up from a chair using your arms (e.g., wheelchair or bedside chair)? 1  Help needed to walk in hospital room? 1  Help needed climbing 3-5 steps with a railing?  1  6 Click Score 6  Consider Recommendation of Discharge To: CIR/SNF/LTACH  PT Recommendation  Follow Up Recommendations SNF;Supervision/Assistance - 24 hour  PT equipment Other (comment) (TBD)  Individuals Consulted  Consulted and Agree with Results and Recommendations Patient  Acute Rehab PT Goals  Patient Stated Goal to have less pain  PT Goal Formulation With patient  Time For Goal Achievement 08/11/20  Potential to Achieve Goals Fair  PT Time Calculation  PT Start Time (ACUTE ONLY) 1209  PT Stop Time (ACUTE ONLY) 1239  PT Time Calculation (min) (ACUTE ONLY) 30 min  PT General Charges  $$ ACUTE PT VISIT 1 Visit  PT Evaluation  $PT Eval Moderate Complexity 1 Mod  Written Expression  Dominant Hand Right    Pt admitted secondary to  problem above with deficits above. Pt limited this session secondary to pain. Total A +2 for bed mobility tasks. Requiring mod A initially for sitting balance, however, able to progress to min guard with increased time. Feel pt would benefit from SNF level therapies at d/c. Will continue to follow acutely.   Reuel Derby, PT, DPT  Acute Rehabilitation Services  Pager: [phone] Office: [phone]

## 2020-07-28 NOTE — Plan of Care (Signed)
  Problem: Clinical Measurements: Goal: Ability to maintain clinical measurements within normal limits will improve Outcome: Progressing Goal: Postoperative complications will be avoided or minimized Outcome: Progressing Goal: Diagnostic test results will improve Outcome: Progressing   Problem: Pain Management: Goal: Pain level will decrease Outcome: Progressing   Problem: Skin Integrity: Goal: Will show signs of wound healing Outcome: Progressing

## 2020-07-28 NOTE — Anesthesia Postprocedure Evaluation (Signed)
Anesthesia Post Note  Patient: Travis HANWAY Sr.  Procedure(s) Performed: RIGHT L1-2, LEFT L2-3, L3-4, L4-5 AND L5-S1 TRANSFORAMINAL LUMBAR INTERBODY FUSIONS, MPACT PEDICLE SCREWS AND RODS, CAGES L1-S1, LOCAL BONE GRAFT, ALLOGRAFT CANCELLOUS CHIPS, VIVIGEN (N/A Spine Lumbar)     Patient location during evaluation: PACU Anesthesia Type: General Level of consciousness: awake and alert Pain management: pain level controlled Vital Signs Assessment: post-procedure vital signs reviewed and stable Respiratory status: spontaneous breathing, nonlabored ventilation, respiratory function stable and patient connected to nasal cannula oxygen Cardiovascular status: blood pressure returned to baseline and stable Postop Assessment: no apparent nausea or vomiting Anesthetic complications: no   No complications documented.  Last Vitals:  Vitals:   07/28/20 1449 07/28/20 2003  BP: (!) 147/76 (!) 154/79  Pulse: 86 89  Resp: 13   Temp: 36.8 C 36.8 C  SpO2: 95% 95%    Last Pain:  Vitals:   07/28/20 2035  TempSrc:   PainSc: 8                  Joziyah Roblero P Kaydance Bowie

## 2020-07-28 NOTE — Evaluation (Signed)
Occupational Therapy Evaluation Patient Details Name: Travis BURLEY Sr. MRN: [id number] DOB: 10/03/1950 Today's Date: 07/28/2020    History of Present Illness This 70 y.o. male admitted for L1- S1 Transforaminal Lumbar fusion.  PMH includes: Rt BKA, anxiety, CAD, h/o MI, chronic pain syndrome, depression, fybromyalgia, h/o hepatitis C, PNS, bil. rotator cuff tear arthropathy, DM, OSA, urethral strciture - self catheterizes, s/p ACDF, CTR, gastric bypass, Lt THA 1/21), s/p TKA bil.   Clinical Impression   Pt admitted with above. He demonstrates the below listed deficits and will benefit from continued OT to maximize safety and independence with BADLs.  Pt presents to OT with generalized weakness, decreased activity tolerance, and increased pain.  He currently requires set up assist - total A for ADLs, and total A +2 bed mobility - unable to attempt transfer OOB this date due to level of assist required and increased pain.  He reports he lives with his niece and her family and was mod I with ADLs and functional mobility PTA.  He will require post acute rehab at discharge.  Will follow.       Follow Up Recommendations  SNF    Equipment Recommendations  None recommended by OT    Recommendations for Other Services       Precautions / Restrictions Precautions Precautions: Back Precaution Booklet Issued: No Precaution Comments: verbally reviewed back precautions with pt      Mobility Bed Mobility Overal bed mobility: Needs Assistance Bed Mobility: Rolling;Sidelying to Sit;Sit to Sidelying Rolling: +2 for physical assistance;+2 for safety/equipment;Total assist Sidelying to sit: +2 for physical assistance;+2 for safety/equipment;Total assist       General bed mobility comments: Pt instructed in log rolling technique.  He assisted minimally requiring assist for all aspects.    Transfers                 General transfer comment: unable to safely attempt    Balance  Overall balance assessment: Needs assistance Sitting-balance support: Feet supported;Single extremity supported;Bilateral upper extremity supported Sitting balance-Leahy Scale: Poor Sitting balance - Comments: Pt with left lateral lean, initially requiring mod A to maintain EOB sitting. He was able to progress to min guard assist after  2 mins and verbal cues                                   ADL either performed or assessed with clinical judgement   ADL Overall ADL's : Needs assistance/impaired Eating/Feeding: Independent   Grooming: Wash/dry hands;Wash/dry face;Oral care;Set up;Supervision/safety;Bed level   Upper Body Bathing: Moderate assistance;Bed level;Sitting   Lower Body Bathing: Maximal assistance;Bed level   Upper Body Dressing : Maximal assistance;Sitting   Lower Body Dressing: Total assistance;Bed level   Toilet Transfer: Total assistance Toilet Transfer Details (indicate cue type and reason): unable Toileting- Clothing Manipulation and Hygiene: Maximal assistance;Bed level       Functional mobility during ADLs: Maximal assistance;+2 for physical assistance (bed level)       Vision         Perception     Praxis      Pertinent Vitals/Pain Pain Assessment: 0-10 Pain Score: 10-Worst pain ever Pain Location: back Pain Descriptors / Indicators: Operative site guarding;Aching;Throbbing Pain Intervention(s): Monitored during session;Limited activity within patient's tolerance;RN gave pain meds during session;Patient requesting pain meds-RN notified;Repositioned     Hand Dominance Right   Extremity/Trunk Assessment Upper Extremity Assessment Upper Extremity Assessment: RUE  deficits/detail;LUE deficits/detail RUE Deficits / Details: h/o Rt RCR.  Shoulder flexion limited ~50* LUE Deficits / Details: h/o Rt RCR shoulder flexion ~95* actively   Lower Extremity Assessment Lower Extremity Assessment: Defer to PT evaluation   Cervical / Trunk  Assessment Cervical / Trunk Assessment: Other exceptions Cervical / Trunk Exceptions: morbidly obese, scoliosis, L1-S1 fusion   Communication Communication Communication: No difficulties   Cognition Arousal/Alertness: Awake/alert Behavior During Therapy: WFL for tasks assessed/performed Overall Cognitive Status: Within Functional Limits for tasks assessed                                     General Comments  Pt awaiting new TLSO as the hardshell TLSO is causing increased pain    Exercises     Shoulder Instructions      Home Living Family/patient expects to be discharged to:: Private residence Living Arrangements: Other relatives Available Help at Discharge: Family Type of Home: Mobile home Home Access: Stairs to enter Technical brewer of Steps: 4 Entrance Stairs-Rails: Left;Right;Can reach both Home Layout: One level     Bathroom Shower/Tub: Teacher, early years/pre: Standard     Home Equipment: Environmental consultant - 2 wheels;Cane - single point   Additional Comments: Has extra prosthetic for showering.  He reports he lives with his niece and her teenage sons      Prior Functioning/Environment Level of Independence: Independent with assistive device(s)        Comments: Pt reports he was mod I with ADLs PTA using SPC        OT Problem List: Decreased strength;Decreased activity tolerance;Decreased range of motion;Impaired balance (sitting and/or standing);Decreased safety awareness;Decreased knowledge of use of DME or AE;Decreased knowledge of precautions;Impaired UE functional use;Obesity;Pain      OT Treatment/Interventions: Self-care/ADL training;Therapeutic exercise;DME and/or AE instruction;Therapeutic activities;Patient/family education;Balance training    OT Goals(Current goals can be found in the care plan section) Acute Rehab OT Goals Patient Stated Goal: to have less pain OT Goal Formulation: With patient Time For Goal Achievement:  04/24/21 Potential to Achieve Goals: Good  OT Frequency: Min 2X/week   Barriers to D/C: Decreased caregiver support  family unable to provide necessary level of assist       Co-evaluation PT/OT/SLP Co-Evaluation/Treatment: Yes Reason for Co-Treatment: For patient/therapist safety;To address functional/ADL transfers   OT goals addressed during session: ADL's and self-care      AM-PAC OT "6 Clicks" Daily Activity     Outcome Measure Help from another person eating meals?: None Help from another person taking care of personal grooming?: A Little Help from another person toileting, which includes using toliet, bedpan, or urinal?: A Lot Help from another person bathing (including washing, rinsing, drying)?: A Lot Help from another person to put on and taking off regular upper body clothing?: A Lot Help from another person to put on and taking off regular lower body clothing?: Total 6 Click Score: 14   End of Session Nurse Communication: Mobility status  Activity Tolerance: Patient limited by pain;Other (comment) (body habitus) Patient left: in bed;with call bell/phone within reach;with bed alarm set;with nursing/sitter in room  OT Visit Diagnosis: Pain Pain - part of body:  (back)                Time: 8413-2440 OT Time Calculation (min): 35 min Charges:  OT General Charges $OT Visit: 1 Visit OT Evaluation $OT Eval Moderate Complexity: 1 Mod  Nilsa Nutting., OTR/L Acute Rehabilitation Services Pager [phone] Office [phone]   Lucille Passy M 07/28/2020, 1:01 PM

## 2020-07-28 NOTE — Op Note (Signed)
07/27/2020  8:45 PM  PATIENT:  Travis [name].  70 y.o. male  MRN: [id number]  OPERATIVE REPORT  PRE-OPERATIVE DIAGNOSIS:  multiple level lumbar spinal stenosis, lumbar degenerative disc disease, lumbar spondylosis with radiculopathy, lumbar spondylolisthesis  POST-OPERATIVE DIAGNOSIS:  multiple level lumbar spinal stenosis, lumbar degenerative disc disease, lumbar spondylosis with radiculopathy, lumbar spondylolisthesis  PROCEDURE:  Procedure(s): RIGHT L1-2, LEFT L2-3, L3-4, L4-5 AND L5-S1 FORAMINOTOMIES WITH NEAR TOTAL FACETECTOMIES, POSTERIOR FUSION L1 to SI WITH GLOBUS MPACT PEDICLE SCREWS AND RODS SEGMENTAL FIXATION, , LOCAL BONE GRAFT, ALLOGRAFT CANCELLOUS CHIPS, VIVIGEN, VACOMYCIN POWDER.    SURGEON:  Jessy Oto, MD     ASSISTANT:  Benjiman Core, PA-C  (Present throughout the entire procedure and necessary for completion of procedure in a timely manner)     ANESTHESIA:  General, supplemented with local marcaine 0.5% 1:1 exparel 1.3% total 20 cc, Dr. Oren Bracket.  EBL: 2800cc  CELL SAVER BLOOD RETURNED: 1400CC, 2 UNITS PRBCs  DRAINS: Foley to SD.    COMPLICATIONS:  Small 2  Mm dural tear right L1-2, repair with a single 4-0 neurolon suture and duraseal.    COMPONENTS:  Implant Name Type Inv. Item Serial No. Manufacturer Lot No. LRB No. Used Action  SCREW THRD CREO 6.5X60 - [id number] Screw SCREW THRD CREO 6.5X60  [employer] N/A 3 Implanted  CAP LOCKING THREADED - [id number] Cap CAP LOCKING THREADED  [employer] N/A 14 Implanted  SCREW THRD CREO 7.5X55 F/5.5 - [id number] Screw SCREW THRD CREO 7.5X55 F/5.5  [employer] N/A 1 Implanted  SCREW AMP MOD CREO 6.5X60 - [id number] Screw SCREW AMP MOD CREO 6.5X60  [employer] N/A 5 Implanted  SCREW AMP MOD CREO 7.5X55 - [id number] Screw SCREW AMP MOD CREO 7.5X55  [employer] N/A 1 Implanted  SCREW SPINAL CREO 7.5X40 - [id number] Screw SCREW SPINAL CREO 7.5X40  [employer] N/A 1 Implanted  HEAD CREO - [id number] Head HEAD CREO  [employer] N/A 7 Implanted  SCREW CREO 7.5X40MM - [id number] Screw SCREW CREO 7.5X40MM  [employer] N/A 1 Implanted  SCREW SPINAL CREO 7.5X40 - [id number] Screw SCREW SPINAL CREO 7.5X40  [employer] N/A 1 Implanted  BONE VIVIGEN FORMABLE 10CC - [id number] Bone Implant BONE VIVIGEN FORMABLE 10CC [id number] [facility]  N/A 1 Implanted  BONE VIVIGEN FORMABLE 10CC - [id number] Bone Implant BONE VIVIGEN FORMABLE 10CC [id number] [employer]  N/A 1 Implanted  BONE CANC CHIPS 20CC - [id number] Bone Implant BONE CANC CHIPS 20CC [id number] [employer]  N/A 1 Implanted  ROD SPINE CURVE CREO 5.5X150 - [id number] Rod ROD SPINE CURVE CREO 5.5X150  [employer] N/A 2 Implanted     PROCEDURE: The patient was met in the holding area, and the appropriate lumbar levels identified and marked with an "X" and my initials. I had discussion with the patient in the preop holding area regarding a change of consent form.The fusion levels are reidentified as L1-2, L2-3, L3-4,L4-5 and L5-S1.  Right sided decompression L1-2 and left sided foraminal decompression L2-3, L3-4 L4-5 and L5-S1. Patient understands the rationale to perform TLIFs at 5 levels to decompress the right L1-2 and left L2-3, L3-4,L4-5 and L5-S1 lateral recesses and foramenal stenosis and to allow for some improved lordotic curves. The L1-2 through L5-S1 discs abnormal on MRI and intraoperative examination of the posterior discs at each level demonstrated inablility to perform TLIFs  without taking down significant bone and increasing instability at each level, decompression was carried out as above and posterior fusion was performed left L1-2 and Right L2 to SI.[name]  The patient was then transported to OR and was placed under general anestheticwithout difficulty. The patient received appropriate preoperative antibiotic prophylaxis  Ancef 3 grams.  Nursing staff inserted a Foley catheter under sterile conditions.Initial insertion of the foley met with some resistance, however a #14 CUDE catherter was able to be placed.After attempts by multiple personnel April Greene RN was able to place the foley just as the Urologist was about to enter the room on consultation.  The patient was then turned to a prone position using the [place] spine frame. PAS. all pressure points well padded the arms at the side to 90 90. Standard prep with DuraPrep solution draped in the usual manner from the lower dorsal spine the mid sacral segment. Iodine Vi-Drape was used and the old incision scar was marked. Time-out procedure was called and correct. Skin in the midline between T12 and S2 was then infiltrated with Marcaine half percent with 1:1 Exparel 1.3% total of 20 cc used. Incision was then made use C-Arm to visualize the midline and landmarks were not palpable extending from T12-S2 through the skin and subcutaneous layers down to the patient's lumbodorsal fascia and spinous processes. TXA 1 gram was given.  The incision then carried sharply excising the supraspinous ligament and then continuing the lateral aspect of the spinous processes of L1 L2-L3 L4-L5 and S1. Cobb elevator used to carefully elevate the paralumbar muscles off of the posterior elements using electrocautery carefully drilled bleeding and perform dissection of the muscle tissues of the preserving the facet at the T12-L1 level and L2-3. Continuing the exposure out laterally to expose the mid portion of the facets bilaterally at T12-L1, L1-2, L2-3, L3-4, L4-5 and L5-S1 in the midline and out to the sacra ala bilaterally exposing the L1-2 to L5-S1 level. incision was carried in the midline down to the S2-S3 level area bleeders controlled using electrocautery monopolar electrocautery. VIper retractors were placed and intraoperative neuromonitoring was performed. 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 Clamps were then placed at the L1-2 level  observed on lateral radiograph the upper clamp was beneath the L2 spinous process.   C-arm fluoroscopy was then brought into the field and using C-arm fluoroscopy then a hole made into the lateral aspect of the pedicle of left L1 observed in the pedicle using ball-tipped nerve hook and hockey stick nerve probe initial entry was determined on fluoroscopy to be good position alignment so that a ball handled probe was then used to probe the left L1 pedicle to a depth of nearly 50 mm observed on C-arm fluoroscopy to be beyond the midpoint of the lumbar vertebra and then position alignment within the left L1 pedicle this was then removed and the pedicle channel probed demonstrating patency no sign of rupture the cortex of the pedicle. Tapping with a 4.5 mm screw then 5..5 mm, 6.38m taps the a 6.5 x 50 mm screw was placed on the left side at the L1 level. using C-arm fluoroscopy then a hole made into the lateral aspect of the pedicle of right L1 observed in the pedicle using ball-tipped nerve hook and hockey stick nerve probe initial entry was determined on fluoroscopy to be good position alignment so that a ball handled probe was then used to probe the right L1 pedicle to a  depth of nearly 50 mm observed on C-arm fluoroscopy to be beyond the midpoint of the lumbar vertebra and then position alignment within the right L1 pedicle this was then removed and the pedicle channel probed demonstrating patency no sign of rupture the cortex of the pedicle. Tapping with a 4.5 mm screw then 5..5 mm, 6.72m taps the a 6.5 x 50 mm screw shank was placed on the right side at the L1 level.  Using C-arm fluoroscopy then a hole made into the lateral aspect of the left pedicle of L2 observed in the pedicle using ball-tipped nerve hook and hockey stick nerve probe initial entry was determined on fluoroscopy to be good position alignment so that a ball handled probe was  then used to probe the left L2 pedicle to a depth of nearly 50 mm observed on C-arm fluoroscopy to be beyond the midpoint of the lumbar vertebra and then position alignment within the left L2 pedicle this was then removed and the pedicle channel probed demonstrating patency no sign of rupture the cortex of the pedicle. Tapping with a 4.5 mm screw then 5.5 and 6.5 mm taps, a 6.5 x 50 mm screw shank was placed on the left side at the L2 level. Using C-arm fluoroscopy then a hole made into the lateral aspect of the right pedicle of L2 observed in the pedicle using ball-tipped nerve hook and hockey stick nerve probe initial entry was determined on fluoroscopy to be good position alignment so that a ball handled probe was then used to probe the right L2 pedicle to a depth of nearly 50 mm observed on C-arm fluoroscopy to be beyond the midpoint of the lumbar vertebra and then position alignment within the right L2 pedicle this was then removed and the pedicle channel probed demonstrating patency no sign of rupture the cortex of the pedicle. Tapping with a 4.5 mm screw then 5.5 and 6.5 mm taps, a 6.5 x 50 mm screw shank was placed on the right side at the L2 level.   Using C-arm fluoroscopy then a hole made into the lateral aspect of the left pedicle of L3 observed in the pedicle using ball-tipped nerve hook and hockey stick nerve probe initial entry was determined on fluoroscopy to be good position alignment so that a ball handled probe was then used to probe the left L3 pedicle to a depth of nearly 50 mm observed on C-arm fluoroscopy to be beyond the midpoint of the lumbar vertebra and then position alignment within the left L3 pedicle this was then removed and the pedicle channel probed demonstrating patency no sign of rupture the cortex of the pedicle. Tapping with a 4.5 mm screw then 5.5 and 6.5 mm taps, a 6.5 x 50 mm screw shank was placed on the left side at the L3 level.  Using C-arm fluoroscopy then a hole made  into the lateral aspect of the right pedicle of L3 observed in the pedicle using ball-tipped nerve hook and hockey stick nerve probe initial entry was determined on fluoroscopy to be good position alignment so that a ball handled probe was then used to probe the right L3 pedicle to a depth of nearly 50 mm observed on C-arm fluoroscopy to be beyond the midpoint of the lumbar vertebra and then position alignment within the right L3 pedicle this was then removed and the pedicle channel probed demonstrating patency no sign of rupture the cortex of the pedicle. Tapping with a 4.5 mm screw then 5.5 and 6.5 mm  taps, a 6.5 x 50 mm screw was placed on the right side at the L3 level.  C-arm fluoroscopy was then brought into the field and using C-arm fluoroscopy then a hole made into the lateral aspect of the left pedicle of L4 observed in the pedicle using ball-tipped nerve hook and hockey stick nerve probe initial entry was determined on fluoroscopy to be good position alignment so that a ball handled probe was then used to probe the left L4 pedicle to a depth of nearly 50 mm observed on C-arm fluoroscopy to be beyond the midpoint of the lumbar vertebra and then position alignment within the left L4 pedicle this was then removed and the pedicle channel probed demonstrating patency no sign of rupture the cortex of the pedicle. Tapping with a 4.5 mm screw then 5.5 and 6.5 mm taps, a 6.5 x 50 mm screw shank was placed on the left side at the L4 level.  Using C-arm fluoroscopy then a hole made into the lateral aspect of the right pedicle of L4 observed in the pedicle using ball-tipped nerve hook and hockey stick nerve probe initial entry was determined on fluoroscopy to be good position alignment so that a ball handled probe was then used to probe the right L4 pedicle to a depth of nearly 50 mm observed on C-arm fluoroscopy to be beyond the midpoint of the lumbar vertebra and then position alignment within the right L4 pedicle  this was then removed and the pedicle channel probed demonstrating patency no sign of rupture the cortex of the pedicle. Tapping with a 4.5 mm screw then 5.5 and 6.5 mm taps, a 6.5 x 50 mm screw was placed on the right side at the L4 level.   Using C-arm fluoroscopy then a hole made into the lateral aspect of the left pedicle of L5 observed in the pedicle using ball-tipped nerve hook and hockey stick nerve probe initial entry was determined on fluoroscopy to be good position alignment so that a ball handled probe was then used to probe the left L5 pedicle to a depth of nearly 50 mm observed on C-arm fluoroscopy to be beyond the midpoint of the lumbar vertebra and then position alignment within the left L5 pedicle this was then removed and the pedicle channel probed demonstrating patency no sign of rupture the cortex of the pedicle. Tapping with a 4.5 mm screw then 5.5 and 6.5 mm taps, a 6.5 x 50 mm screw shank was placed on the left side at the L5 level.  Using C-arm fluoroscopy then a hole made into the lateral aspect of the right pedicle of L5 observed in the pedicle using ball-tipped nerve hook and hockey stick nerve probe initial entry was determined on fluoroscopy to be good position alignment so that a ball handled probe was then used to probe the right L5 pedicle to a depth of nearly 50 mm observed on C-arm fluoroscopy to be beyond the midpoint of the lumbar vertebra and then position alignment within the right L5 pedicle this was then removed and the pedicle channel probed demonstrating patency no sign of rupture the cortex of the pedicle. Tapping with a 4.5 mm screw then 5.5 and 6.5 mm taps, a 6.5 x 50 mm screw was placed on the right side at the L5 level.  C-arm fluoroscopy was then brought into the field and  Using C-arm fluoroscopy then a hole made into the lateral aspect of the left pedicle of S1 observed in the pedicle using ball-tipped  nerve hook and hockey stick nerve probe initial entry was  determined on fluoroscopy to be good position alignment so that a ball handled probe was then used to probe the left S1 pedicle to a depth of nearly 40 mm observed on C-arm fluoroscopy to be beyond the midpoint of the lumbar vertebra and then position alignment within the left S! pedicle this was then removed and the pedicle channel probed demonstrating patency no sign of rupture the cortex of the pedicle. Tapping with a 4.5 mm screw then 5.5, 6.5 and 7.5 mm taps, a 7.5 x 40 mm screw shank was placed on the left side at the S1 level.  Using C-arm fluoroscopy then a hole made into the lateral aspect of the right pedicle of S1 observed in the pedicle using ball-tipped nerve hook and hockey stick nerve probe initial entry was determined on fluoroscopy to be good position alignment so that a ball handled probe was then used to probe the right S1 pedicle to a depth of nearly 40 mm observed on C-arm fluoroscopy to be beyond the midpoint of the lumbar vertebra and then position alignment within the right S1 pedicle this was then removed and the pedicle channel probed demonstrating patency no sign of rupture the cortex of the pedicle. Tapping with a 4.5 mm screw then 5.5, 6.5 and 7.5 mm taps, a 7.5 x 40 mm screw was placed on the right side at the S1 level. Both S1 screws were placed percutaneously through the lumbosacral fascia through stab incisions as this patient's medial iliac crest approximated the area overlying the SI facets and the entry point for screw placement. The C-arm was then removed from the field and attention turned to performing decompressive foraminotomies for placement of cages and for openning the foramen at each level demonstrating stenosis, right L1-2 and left L2-3, L3-4, L4-5 and L5-S1.  The operating room microscope sterilely draped brought into the field. Under the operating room microscope, the right L1-2 interspace carefully debrided the small amount of muscle attachment here and high-speed  bur used to drill the medial aspect of the inferior articular process of L1 approximately 10%. 2 mm Kerrison then used to enter the spinal canal over the superior aspect of the L2 lamina carefully using the Kerrison to debris the attachment as a curet. Foraminotomy was then performed over the L2 nerve root. The medial 10% superior articular process of L2 and then resected using an osteotome and 2 mm Kerrison. This allowed for identification of the thecal sac. Penfield 4 was then used to carefully mobilize the thecal sac medially and the L2 nerve root identified within the lateral recess flattened over the posterior aspect of the bulging disc. Carefully the lateral aspect of the L2 nerve root was identified and a Penfield 4 was used to mobilize the nerve medially such that the bulging disc was visible with microscope. Using a Penfield 4 for retraction and a 15 blade scalpel was used to incise the posterior longitudinal ligament within the lateral recess on the left side longitudinally. Disc was bulging but not herniated and there was right L1 foramenal narrowing present. Further foraminotomies was performed over the L1 nerve root the nerve root was noted to be decompressed. The nerve root able to be retracted along the medial aspect of the L2 pedicle the lateral recess and L2 neuroforamen appeared well decompressed.  Ligamentum flavum was further debrided on the left side inferiorly at  L2-3 disc. Had a moderate amount of further resection of the  L2 lamina inferiorly was performed. With this then the disc space at L2-3 was easily visualized and entry into the disc at the left L2-3 level was not possible due to bridging of the left L2-3 disc posteriorly with bone. Ligamentum flavum was debrided and lateral recess along the medial aspect L2-3 facet no further decompression was necessary. Ball tip nerve probe was then able to carefully palpate the neuroforamen for L2 and L3 finding these to be well decompressed.  Attention then turned to the left L3-4 level which was easily visualized with the microscope.  Under the operating room microscope, the L3-4 interspace carefully debrided the small amount of muscle attachment here and high-speed bur used to drill the medial aspect of the inferior articular process of L3 approximately 10%. A localization lateral C-arm view was obtained with Penfield 4 in the L3-4 facet. 2 mm Kerrison then used to enter the spinal canal over the superior aspect of the L4 lamina carefully using the Kerrison to debris the attachment as a curet. Foraminotomy was then performed over the L4 nerve root. The medial 10% superior articular process of L4 and then resected using an osteotome and 2 mm Kerrison. This allowed for identification of the thecal sac. Penfield 4 was then used to carefully mobilize the thecal sac medially and the L4 nerve root identified within the lateral recess flattened over the posterior aspect of the L3-4 bulging disc. Carefully the lateral aspect of the L4 nerve root was identified and a Penfield 4 was used to mobilize the nerve medially such that the disc was visible with microscope. Foraminotomies were performed over the left L3 and L4 nerve root the nerve root was noted to be decompressed. The nerve root able to be retracted along the medial aspect of the L4 pedicle and disc found to be calcified at this level was not able to be entered with Gaspar Garbe.  Ligamentum flavum was further debrided inferiorly to the level L4-5 disc. Had a moderate amount of further resection of the L5 lamina inferiorly was performed. Previous central laminecomty at this level. With this then the disc space at L4-5 was visualized and this was possible using a Penfield 4 intraoperative. Ligamentum flavum was debrided and lateral recess along the medial aspect L4-5 facet no further decompression was necessary. Ball tip nerve probe was then able to carefully palpate the neuroforamen for L4 finding this to  be well decompressed. Attention then turned to the left L5-S1 level which was easily visualized with the microscope.  No significant active bleeding present at the time of removal. All instruments sponge counts were correct traction system was then carefully removed carefully rotating retractors with this withdrawal and only bipolar electrocautery of any small bleeders. Attention then turned to attempted placement of the transforaminal lumbar interbody fusion cages. Using a Penfield 4 the right lateral aspect of the thecal sac at the L5-S1 disc space was carefully freed up The thecal sac could then easily be retracted in the posterior lateral aspect of the L5-S1 disc was exposed and an osteotome used to resect a small portion of bone off the superior aspect of the superior articular process of S1. Examination of the posterior disc at each level demonstrated severe narrowing of the disc with bone bridging so that after first attempting to enter the disc at left L3-4, and L4-5 it was determined that attempts at TLIF would likely result in subsided cages. Bleeders were controlled using bipolar electrocautery thrombin-soaked Gelfoam were appropriate. Of the 5 screws on the  left L2, L3, L4, L5 and S1 screw fasteners were placed and the 2 screw fasteners on the right at L1 and L2 were placed, then each carefully aligned and tightened or loose and a slight amount to allow for placement of rods. Template was made along the right side first quarter inch titanium rod was then carefully contoured used using the template and divided appropriately. This was then placed into the pedicle screws on the right extending from L2-S1 each of the capsule and carefully placed loosely tightened. Attention turned to the left side were similarly and then screws were carefully adjusted to allow for a better pattern screws to allow for placement of fixation rod template for the rod was then taken and a quarter inch titanium rod was then  carefully contoured. This was able to be inserted into the right pedicle screw fasteners and then using a persuader to place caps onto the right L4 fasteners the upper 2 pedicle screw caps appeared to fit quite well. The right S1 rod fastener cap was then tightened 85 pounds then on the right side disc space at L4-5 and L3-4 and L2-3 and L1-2 screw fasteners no compression was obtained on the right side between the fasteners right or left side and tightening the screw caps 85 pounds. Similarly this was done on the left side at L5-S1, L4-5, L3-4, L2-3 and L1-2 screws using no compression and tightened the fastener caps to 85 pounds. Decortication was then carried out right L2 to S1 and left side L1-2, posterior grafting with local bone graft harvested from the facets resected and allograft cancellous bone graft and vivigen. Additional graft placed lateral to the rod left L3 to S1. During decortication using the 1/4 inch osteotome on the right side the osteotome caused a small dural tear about 86m with leak. This was closed by using the OR microscope and placing a single 4-0 neurolon suture, testing valvsalva to ensure no further leak then applying Duraseal to the right posterior L1-2 laminotomy site. This tear was due to the osteotome slipping and nicking the dura posteriorly. No neural element injury occurred.  Iirrigation was carried out with copious amounts of saline solution this was done throughout the case. Cell Saver was used during the case. Permanent C-arm images were obtained in AP and lateral plane. Remaining local bone graft was then applied along the rightt lateral posterior lateral region extending from L2 through L5 transverse processes to the left L5-S1 postolateral fusion mass. One gram vancomycin powder was sprinkled over the incision walls and deep incision areas. Excess Gelfoam was then removed the lumbodorsal musculature carefully exam debrided of any devitalized tissue following removal of  Vicryl retractors were the bleeders were controlled using electrocautery and the area dorsal lumbar muscle were then approximated in the midline with interrupted #1 Vicryl sutures loose the dorsal fascia was reattached to the spinous process of L1 to superiorly and S1 inferiorly this was done with #1 Vicryl sutures. Subcutaneous layers then approximated using interrupted 0 Vicryl sutures and 2-0 Vicryl sutures. Skin was closed with a running subcutaneous stitch of 4-0 Vicryl Dermabond was applied then MedPlex bandage. All instrument and sponge counts were correct. The patient was then returned to a supine position on her bed reactivated extubated and returned to the recovery room in satisfactory condition.   JBenjiman Core PA-C perform the duties of assistant surgeon during this case. He was present from the beginning of the case to the end of the case assisting in transfer the  patient from his stretcher to the OR table and back to the stretcher at the end of the case. Assisted in careful retraction and suction of the laminectomy site delicate neural structures operating under the operating room microscope. He performed closure of the incision from the fascia to the skin applying the dressing.           [name]  07/28/2020, [name]

## 2020-07-29 ENCOUNTER — Inpatient Hospital Stay (HOSPITAL_COMMUNITY): Payer: Medicare HMO

## 2020-07-29 LAB — GLUCOSE, CAPILLARY
Glucose-Capillary: 161 mg/dL — ABNORMAL HIGH (ref 70–99)
Glucose-Capillary: 120 mg/dL — ABNORMAL HIGH (ref 70–99)
Glucose-Capillary: 148 mg/dL — ABNORMAL HIGH (ref 70–99)
Glucose-Capillary: 181 mg/dL — ABNORMAL HIGH (ref 70–99)

## 2020-07-29 MED ORDER — LOPERAMIDE HCL 2 MG PO CAPS
2.0000 mg | ORAL_CAPSULE | ORAL | Status: DC | PRN
  Administered 2020-07-29 – 2020-07-30 (×4): 2 mg via ORAL
  Filled 2020-07-29 (×4): qty 1

## 2020-07-29 MED FILL — Sodium Chloride IV Soln 0.9%: INTRAVENOUS | Qty: 2000 | Status: AC

## 2020-07-29 MED FILL — Sodium Chloride Irrigation Soln 0.9%: Qty: 6000 | Status: AC

## 2020-07-29 MED FILL — Heparin Sodium (Porcine) Inj 1000 Unit/ML: INTRAMUSCULAR | Qty: 60 | Status: AC

## 2020-07-29 NOTE — Progress Notes (Signed)
Physical Therapy Treatment Patient Details Name: Travis HIJAZI Sr. MRN: [id number] DOB: [birth year] Today's Date: 07/29/2020    History of Present Illness This 70 y.o. male admitted 3/8 for L1- S1 posterior Lumbar fusion.  PMH includes: Rt BKA, anxiety, CAD, h/o MI, chronic pain syndrome, depression, fybromyalgia, h/o hepatitis C, PNS, bilateral rotator cuff tear arthropathy, DM, OSA, urethral strciture - self catheterizes, s/p ACDF, CTR, gastric bypass, bilateral THA 1/21), s/p bilateral TKA    PT Comments    Pt limited in mobility progression by pain from poor fitting TLSO brace (pt awaiting custom TLSO brace) and by edema preventing donning of his R leg prothesis. However, he was able to tolerate his back pain more today by being able to roll to R with modA, roll to L with maxA, and transition sidelying <> sit and lateral scoot transfer to L on EOB with maxAx2 this date. Will continue to follow acutely. Current recommendations remain appropriate.    Follow Up Recommendations  SNF;Supervision/Assistance - 24 hour     Equipment Recommendations  Other (comment) (TBD)    Recommendations for Other Services       Precautions / Restrictions Precautions Precautions: Back Precaution Booklet Issued: Yes (comment) Precaution Comments: Reviewed back precautions, good recall later in session Required Braces or Orthoses: Spinal Brace Spinal Brace: Other (comment);Applied in supine position (TLSO; clam shell) Spinal Brace Comments: Pt awaiting customized clam shell TLSO Restrictions Weight Bearing Restrictions: No    Mobility  Bed Mobility Overal bed mobility: Needs Assistance Bed Mobility: Rolling;Sidelying to Sit;Sit to Sidelying Rolling: Mod assist;Max assist Sidelying to sit: +2 for physical assistance;+2 for safety/equipment;Max assist     Sit to sidelying: +2 for physical assistance;+2 for safety/equipment;Max assist General bed mobility comments: Pt instructed in log rolling  technique. Pt rolling to R 2x and to L 3x, needing modA to roll to R and maxA to roll to L to reach hand to contralateral bed rail to assist with roll. Sidelying to sit needing maxAx2 to manage legs and trunk, with cues to bring L elbow under trunk to assist with push up to sit. MaxAx2 to safely manage trunk and legs back to sidelying, cuing to lean laterally onto elbow and swing legs up.    Transfers Overall transfer level: Needs assistance   Transfers: Lateral/Scoot Transfers          Lateral/Scoot Transfers: Max assist;+2 physical assistance;+2 safety/equipment General transfer comment: Pt scooting laterally to L 3x with maxAx2 and cues to lean anteriorly slightly to utilize L leg to assist. Unable to fit R prosthesis due to edema and pt unable to lean anteriorly due to discomfort with TLSO thus held off on attempt to come to stand.  Ambulation/Gait             General Gait Details: Deferred due to safety concerns.   Stairs             Wheelchair Mobility    Modified Rankin (Stroke Patients Only)       Balance Overall balance assessment: Needs assistance Sitting-balance support: Feet supported;Single extremity supported;Bilateral upper extremity supported Sitting balance-Leahy Scale: Poor Sitting balance - Comments: Pt initially requiring mod A to maintain EOB sitting due to posterior lean. He was able to progress to min guard assist after initial ~30 seconds with R hand on end of bed rail. Posterior lean due to pain with TLSO brace. Postural control: Posterior lean  Cognition Arousal/Alertness: Awake/alert Behavior During Therapy: WFL for tasks assessed/performed Overall Cognitive Status: Within Functional Limits for tasks assessed                                        Exercises      General Comments General comments (skin integrity, edema, etc.): Pt awaiting new TLSO as the hardshell TLSO is  causing increased pain; R leg elevated on pillow with socks donned to assist in decreasing edema      Pertinent Vitals/Pain Pain Assessment: 0-10 Pain Score: 8  Pain Location: back Pain Descriptors / Indicators: Operative site guarding;Aching;Grimacing;Guarding;Moaning Pain Intervention(s): Limited activity within patient's tolerance;Monitored during session;Repositioned    Home Living                      Prior Function            PT Goals (current goals can now be found in the care plan section) Acute Rehab PT Goals Patient Stated Goal: to walk PT Goal Formulation: With patient Time For Goal Achievement: 08/11/20 Potential to Achieve Goals: Fair Progress towards PT goals: Progressing toward goals    Frequency    Min 5X/week      PT Plan Current plan remains appropriate    Co-evaluation              AM-PAC PT "6 Clicks" Mobility   Outcome Measure  Help needed turning from your back to your side while in a flat bed without using bedrails?: A Lot Help needed moving from lying on your back to sitting on the side of a flat bed without using bedrails?: A Lot Help needed moving to and from a bed to a chair (including a wheelchair)?: Total Help needed standing up from a chair using your arms (e.g., wheelchair or bedside chair)?: Total Help needed to walk in hospital room?: Total Help needed climbing 3-5 steps with a railing? : Total 6 Click Score: 8    End of Session Equipment Utilized During Treatment: Back brace Activity Tolerance: Patient limited by pain Patient left: in bed;with call bell/phone within reach;with bed alarm set Nurse Communication: Mobility status PT Visit Diagnosis: Unsteadiness on feet (R26.81);Muscle weakness (generalized) (M62.81);Difficulty in walking, not elsewhere classified (R26.2);Pain Pain - part of body:  (back)     Time: 2778-2423 PT Time Calculation (min) (ACUTE ONLY): 32 min  Charges:  $Therapeutic Activity: 23-37  mins                     Moishe Spice, PT, DPT Acute Rehabilitation Services  Pager: [phone] Office: [place] 07/29/2020, 2:26 PM

## 2020-07-29 NOTE — Plan of Care (Signed)
  Problem: Education: Goal: Ability to verbalize activity precautions or restrictions will improve Outcome: Progressing Goal: Knowledge of the prescribed therapeutic regimen will improve Outcome: Progressing Goal: Understanding of discharge needs will improve Outcome: Progressing   Problem: Activity: Goal: Will remain free from falls Outcome: Progressing   Problem: Bowel/Gastric: Goal: Gastrointestinal status for postoperative course will improve Outcome: Progressing   Problem: Clinical Measurements: Goal: Ability to maintain clinical measurements within normal limits will improve Outcome: Progressing Goal: Postoperative complications will be avoided or minimized Outcome: Progressing Goal: Diagnostic test results will improve Outcome: Progressing   Problem: Pain Management: Goal: Pain level will decrease Outcome: Progressing   Problem: Skin Integrity: Goal: Will show signs of wound healing Outcome: Progressing

## 2020-07-30 ENCOUNTER — Inpatient Hospital Stay (HOSPITAL_COMMUNITY): Payer: Medicare HMO

## 2020-07-30 LAB — CBC WITH DIFFERENTIAL/PLATELET
Abs Immature Granulocytes: 0.05 10*3/uL (ref 0.00–0.07)
Basophils Absolute: 0 10*3/uL (ref 0.0–0.1)
Basophils Relative: 0 %
Eosinophils Absolute: 0 10*3/uL (ref 0.0–0.5)
Eosinophils Relative: 0 %
HCT: 26.3 % — ABNORMAL LOW (ref 39.0–52.0)
Hemoglobin: 9.4 g/dL — ABNORMAL LOW (ref 13.0–17.0)
Immature Granulocytes: 1 %
Lymphocytes Relative: 7 %
Lymphs Abs: 0.6 10*3/uL — ABNORMAL LOW (ref 0.7–4.0)
MCH: 31.8 pg (ref 26.0–34.0)
MCHC: 35.7 g/dL (ref 30.0–36.0)
MCV: 88.9 fL (ref 80.0–100.0)
Monocytes Absolute: 1 10*3/uL (ref 0.1–1.0)
Monocytes Relative: 13 %
Neutro Abs: 5.9 10*3/uL (ref 1.7–7.7)
Neutrophils Relative %: 79 %
Platelets: 194 10*3/uL (ref 150–400)
RBC: 2.96 MIL/uL — ABNORMAL LOW (ref 4.22–5.81)
RDW: 12.4 % (ref 11.5–15.5)
WBC: 7.4 10*3/uL (ref 4.0–10.5)
nRBC: 0 % (ref 0.0–0.2)

## 2020-07-30 LAB — GLUCOSE, CAPILLARY
Glucose-Capillary: 161 mg/dL — ABNORMAL HIGH (ref 70–99)
Glucose-Capillary: 181 mg/dL — ABNORMAL HIGH (ref 70–99)
Glucose-Capillary: 176 mg/dL — ABNORMAL HIGH (ref 70–99)
Glucose-Capillary: 140 mg/dL — ABNORMAL HIGH (ref 70–99)

## 2020-07-30 MED ORDER — MAGIC MOUTHWASH
5.0000 mL | Freq: Four times a day (QID) | ORAL | Status: DC
  Administered 2020-07-30 (×3): 5 mL via ORAL
  Filled 2020-07-30 (×15): qty 5

## 2020-07-30 NOTE — TOC Progression Note (Signed)
Transition of Care (TOC) - Progression Note    Patient Details  Name: Travis COIA Sr. MRN: [id number] Date of Birth: 08/19/1950  Transition of Care [facility]) CM/SW [place], [place] Phone Number: 07/30/2020, 1:29 PM  Clinical Narrative:     [facility] confirmed bed availability- SNF will start insurance authorization.  CSW informed patient [facility] has accepted referral RN updated   Thurmond Butts, MSW, LCSW Clinical Social Worker   Expected Discharge Plan: Skilled Nursing Facility Barriers to Discharge: Insurance Authorization,SNF Pending bed offer  Expected Discharge Plan and Services Expected Discharge Plan: [place] In-house Referral: Clinical Social Work     Living arrangements for the past 2 months: Single Family Home                                       Social Determinants of Health (SDOH) Interventions    Readmission Risk Interventions No flowsheet data found.

## 2020-07-30 NOTE — Progress Notes (Signed)
Occupational Therapy Treatment Patient Details Name: Travis TSANG Sr. MRN: [id number] DOB: Nov 01, 1950 Today's Date: 07/30/2020    History of present illness This 70 y.o. male admitted 3/8 for L1- S1 posterior Lumbar fusion.  PMH includes: Rt BKA, anxiety, CAD, h/o MI, chronic pain syndrome, depression, fybromyalgia, h/o hepatitis C, PNS, bilateral rotator cuff tear arthropathy, DM, OSA, urethral strciture - self catheterizes, s/p ACDF, CTR, gastric bypass, bilateral THA 1/21), s/p bilateral TKA   OT comments  Recommendation for [employer] to shorten the TLSO brace from the bottom if possible for better fit. PT Anessa calling on therapy behalf. Pt requires (A) to don sock compression R LE to help with edema management. Recommendation for Snf at this time.    Follow Up Recommendations  SNF    Equipment Recommendations  None recommended by OT    Recommendations for Other Services      Precautions / Restrictions Precautions Precautions: Back Precaution Booklet Issued: Yes (comment) Precaution Comments: R BKA hx; Reviewed back precautions Required Braces or Orthoses: Spinal Brace Spinal Brace: Other (comment);Applied in sitting position (TLSO; clam shell) Spinal Brace Comments: Coordinated with [employer] on decreasing length of anterior portion of clam shell for better fit Restrictions Weight Bearing Restrictions: No       Mobility Bed Mobility Overal bed mobility: Needs Assistance Bed Mobility: Rolling Rolling: Min assist Sidelying to sit: Min assist       General bed mobility comments: Pt requires (A) of bil LE but able to follow command to lay on L shoulder. pt does roll backward slightly prematurely and need (A) to min to center in bed    Transfers Overall transfer level: Needs assistance Equipment used: Rolling walker (2 wheeled) Transfers: Sit to/from [facility] Sit to Stand: Mod assist;Max assist;+2 physical assistance;+2 safety/equipment Stand pivot  transfers: Min assist;+2 physical assistance;+2 safety/equipment       General transfer comment: unable due to transport arriving for xray    Balance Overall balance assessment: Needs assistance Sitting-balance support: Feet supported;Single extremity supported;Bilateral upper extremity supported Sitting balance-Leahy Scale: Good Sitting balance - Comments: able to don doff prosthetic at eob   Standing balance support: Bilateral upper extremity supported;During functional activity Standing balance-Leahy Scale: Poor Standing balance comment: Bil UE and external support.                           ADL either performed or assessed with clinical judgement   ADL Overall ADL's : Needs assistance/impaired                     Lower Body Dressing: Moderate assistance                 General ADL Comments: on arrival returning to bed with pending xray. pt was able to doff prosthetic. pt with metal brace sleeve still in place. pt advised that unable to wear due to xray. Pt don new sock sleeve for edema managment with Mod (A)     Vision       Perception     Praxis      Cognition Arousal/Alertness: Awake/alert Behavior During Therapy: WFL for tasks assessed/performed Overall Cognitive Status: Within Functional Limits for tasks assessed                                          Exercises  Shoulder Instructions       General Comments checking back brace placement and recommendation for reduction of the bottom of the brace by 1- 1.5 inches to allow for proper fit with straps moved upward. [employer] being called by PT and recommendations communicated for better fit. pt currently with brace cutting into the legs or pushing into his throat    Pertinent Vitals/ Pain       Pain Assessment: Faces Faces Pain Scale: Hurts little more Pain Location: back Pain Descriptors / Indicators: Operative site guarding;Aching;Grimacing;Guarding;Moaning Pain  Intervention(s): Monitored during session;Repositioned  Home Living                                          Prior Functioning/Environment              Frequency  Min 2X/week        Progress Toward Goals  OT Goals(current goals can now be found in the care plan section)  Progress towards OT goals: Progressing toward goals  Acute Rehab OT Goals Patient Stated Goal: to walk OT Goal Formulation: With patient Time For Goal Achievement: 04/24/21 Potential to Achieve Goals: Good ADL Goals Pt Will Perform Grooming: with set-up;sitting Pt Will Perform Lower Body Bathing: with mod assist;with adaptive equipment;sit to/from stand Pt Will Transfer to Toilet: with mod assist;stand pivot transfer;bedside commode Pt Will Perform Toileting - Clothing Manipulation and hygiene: with mod assist;sit to/from stand  Plan Discharge plan remains appropriate    Co-evaluation                 AM-PAC OT "6 Clicks" Daily Activity     Outcome Measure   Help from another person eating meals?: None Help from another person taking care of personal grooming?: A Little Help from another person toileting, which includes using toliet, bedpan, or urinal?: A Lot Help from another person bathing (including washing, rinsing, drying)?: A Lot Help from another person to put on and taking off regular upper body clothing?: A Lot Help from another person to put on and taking off regular lower body clothing?: Total 6 Click Score: 14    End of Session    OT Visit Diagnosis: Pain   Activity Tolerance Patient tolerated treatment well   Patient Left in bed;with call bell/phone within reach;with bed alarm set;with nursing/sitter in room   Nurse Communication Mobility status        Time: 1345-1353 OT Time Calculation (min): 8 min  Charges: OT General Charges $OT Visit: 1 Visit OT Treatments $Self Care/Home Management : 8-22 mins   Brynn, OTR/L  Acute Rehabilitation  Services Pager: [phone] Office: [phone] .    Jeri Modena 07/30/2020, 4:12 PM

## 2020-07-30 NOTE — Progress Notes (Addendum)
     Subjective: 3 Days Post-Op Procedure(s) (LRB): RIGHT L1-2, LEFT L2-3, L3-4, L4-5 AND L5-S1 TRANSFORAMINAL LUMBAR INTERBODY FUSIONS, MPACT PEDICLE SCREWS AND RODS, CAGES L1-S1, LOCAL BONE GRAFT, ALLOGRAFT CANCELLOUS CHIPS, VIVIGEN (N/A)  Patient reports pain as mild.    Objective:   VITALS:  Temp:  [97.8 F (36.6 C)-99.2 F (37.3 C)] 97.8 F (36.6 C) (03/11 1200) Pulse Rate:  [64-71] 70 (03/11 1200) Resp:  [15-19] 15 (03/11 1200) BP: (125-150)/(56-66) 135/56 (03/11 1200) SpO2:  [94 %-96 %] 96 % (03/11 1200) Complaints of left buttock pain similar to what he had preop. Pain is into the left SI area. Voiding without difficulty. He reports that his pain is controlled with narcotics.  Neurologically intact ABD soft Neurovascular intact Sensation intact distally Intact pulses distally Dorsiflexion/Plantar flexion intact Incision: dressing C/D/I and no drainage Productive cough, with plegm.  LABS Recent Labs    07/27/20 1535 07/27/20 1819 07/28/20 0403  HGB 12.2* 12.2* 14.2  WBC  --   --  14.1*  PLT  --   --  197   Recent Labs    07/27/20 1819 07/28/20 0403  NA 135 134*  K 4.7 4.3  CL 103 104  CO2  --  22  BUN 18 18  CREATININE 0.80 1.02  GLUCOSE 168* 202*   No results for input(s): LABPT, INR in the last 72 hours.   Assessment/Plan: 3 Days Post-Op Procedure(s) (LRB): RIGHT L1-2, LEFT L2-3, L3-4, L4-5 AND L5-S1 TRANSFORAMINAL LUMBAR INTERBODY FUSIONS, MPACT PEDICLE SCREWS AND RODS, CAGES L1-S1, LOCAL BONE GRAFT, ALLOGRAFT CANCELLOUS CHIPS, VIVIGEN (N/A) Productive cough.  Advance diet Up with therapy D/C IV fluids Discharge to SNF  Obtain CXR and CBC today due to cough.   [name] 07/30/2020, 1:19 PMPatient ID: Travis Dyer., male   DOB: 1951/04/15, 70 y.o.   MRN: [id number]

## 2020-07-30 NOTE — Progress Notes (Signed)
Physical Therapy Treatment Patient Details Name: Travis ASBRIDGE Sr. MRN: [id number] DOB: [birth year] Today's Date: 07/30/2020    History of Present Illness This 70 y.o. male admitted 3/8 for L1- S1 posterior Lumbar fusion.  PMH includes: Rt BKA, anxiety, CAD, h/o MI, chronic pain syndrome, depression, fybromyalgia, h/o hepatitis C, PNS, bilateral rotator cuff tear arthropathy, DM, OSA, urethral strciture - self catheterizes, s/p ACDF, CTR, gastric bypass, bilateral THA 1/21), s/p bilateral TKA    PT Comments    Pt making significant progress this date as his pain continues to improve, needing only minA to roll to R, maxA to roll to L, maxAx1 to transition sidelying to sit, mod-maxAx2 to power up to stand depending on height of seated surface, and minAx2 to ambulate short household distances with a RW and his prosthesis donned. Pt displays trunk anterior and R lateral flexion with gait resulting in him pushing his RW distally to him as he fatigues, placing him at risk for falls. Will continue to follow acutely. Current recommendations remain appropriate.    Follow Up Recommendations  SNF;Supervision/Assistance - 24 hour     Equipment Recommendations  Other (comment) (TBD)    Recommendations for Other Services       Precautions / Restrictions Precautions Precautions: Back Precaution Booklet Issued: Yes (comment) Precaution Comments: R BKA hx; Reviewed back precautions Required Braces or Orthoses: Spinal Brace Spinal Brace: Other (comment);Applied in sitting position (TLSO; clam shell) Spinal Brace Comments: Coordinated with [employer] on decreasing length of anterior portion of clam shell for better fit Restrictions Weight Bearing Restrictions: No    Mobility  Bed Mobility Overal bed mobility: Needs Assistance Bed Mobility: Rolling;Sidelying to Sit Rolling: Max assist;Min assist Sidelying to sit: Max assist       General bed mobility comments: Pt log rolling to R 1x with minA  to reach rail, maxA to roll to L to rotate trunk and help R hand reach L rail. MaxA to initiate trunk ascension to allow L elbow > hand to get positioned under trunk to sit up EOB and square hips with EOB.    Transfers Overall transfer level: Needs assistance Equipment used: Rolling walker (2 wheeled) Transfers: Sit to/from [facility] Sit to Stand: Mod assist;Max assist;+2 physical assistance;+2 safety/equipment Stand pivot transfers: Min assist;+2 physical assistance;+2 safety/equipment       General transfer comment: Donned R prosthesis, cuing pt to push up from bed and transition hands to RW once standing. When EOB elevated pt able to come to stand with modA. However, when in lower sitting position in recliner pt needing mod-maxAx2 to power up to stand. Placed several pillows under buttocks in recliner end of session for easier transfers for nursing staff.  Ambulation/Gait Ambulation/Gait assistance: Min assist;+2 safety/equipment Gait Distance (Feet): 50 Feet Assistive device: Rolling walker (2 wheeled) Gait Pattern/deviations: Step-through pattern;Decreased step length - right;Decreased step length - left;Decreased stride length;Trunk flexed (R trunk flexion) Gait velocity: reduced Gait velocity interpretation: <1.31 ft/sec, indicative of household ambulator General Gait Details: Pt ambulating with trunk anteriorly and R laterally flexed (hx of scoliosis per pt), cues to correct posture. MinA for stability with chair follow due to noted increased unsteadiness and anterior propulsion of RW as pt fatigued.   Stairs             Wheelchair Mobility    Modified Rankin (Stroke Patients Only)       Balance Overall balance assessment: Needs assistance Sitting-balance support: Feet supported;Single extremity supported;Bilateral upper extremity supported Sitting  balance-Leahy Scale: Poor Sitting balance - Comments: Pt needing min guard for static sitting EOB.    Standing balance support: Bilateral upper extremity supported;During functional activity Standing balance-Leahy Scale: Poor Standing balance comment: Bil UE and external support.                            Cognition Arousal/Alertness: Awake/alert Behavior During Therapy: WFL for tasks assessed/performed Overall Cognitive Status: Within Functional Limits for tasks assessed                                        Exercises      General Comments General comments (skin integrity, edema, etc.): Coordinated with [employer] in regards to fitting brace      Pertinent Vitals/Pain Pain Assessment: Faces Faces Pain Scale: Hurts little more Pain Location: back Pain Descriptors / Indicators: Operative site guarding;Aching;Grimacing;Guarding;Moaning Pain Intervention(s): Limited activity within patient's tolerance;Monitored during session;Repositioned    Home Living                      Prior Function            PT Goals (current goals can now be found in the care plan section) Acute Rehab PT Goals Patient Stated Goal: to walk PT Goal Formulation: With patient Time For Goal Achievement: 08/11/20 Potential to Achieve Goals: Good Progress towards PT goals: Progressing toward goals    Frequency    Min 5X/week      PT Plan Current plan remains appropriate    Co-evaluation              AM-PAC PT "6 Clicks" Mobility   Outcome Measure  Help needed turning from your back to your side while in a flat bed without using bedrails?: A Lot Help needed moving from lying on your back to sitting on the side of a flat bed without using bedrails?: A Lot Help needed moving to and from a bed to a chair (including a wheelchair)?: A Little Help needed standing up from a chair using your arms (e.g., wheelchair or bedside chair)?: A Lot Help needed to walk in hospital room?: A Little Help needed climbing 3-5 steps with a railing? : Total 6 Click Score:  13    End of Session Equipment Utilized During Treatment: Back brace;Other (comment) (prosthesis) Activity Tolerance: Patient tolerated treatment well Patient left: with call bell/phone within reach;in chair;with chair alarm set Nurse Communication: Mobility status (communicated with NT) PT Visit Diagnosis: Unsteadiness on feet (R26.81);Muscle weakness (generalized) (M62.81);Difficulty in walking, not elsewhere classified (R26.2);Pain;Other abnormalities of gait and mobility (R26.89) Pain - part of body:  (back)     Time: 1749-4496 PT Time Calculation (min) (ACUTE ONLY): 27 min  Charges:  $Gait Training: 8-22 mins $Therapeutic Activity: 8-22 mins                     Moishe Spice, PT, DPT Acute Rehabilitation Services  Pager: [phone] Office: [place] 07/30/2020, 3:13 PM

## 2020-07-30 NOTE — Plan of Care (Signed)
  Problem: Activity: Goal: Ability to avoid complications of mobility impairment will improve Outcome: Progressing Goal: Ability to tolerate increased activity will improve Outcome: Progressing Goal: Will remain free from falls Outcome: Progressing   Problem: Bowel/Gastric: Goal: Gastrointestinal status for postoperative course will improve Outcome: Progressing   Problem: Clinical Measurements: Goal: Ability to maintain clinical measurements within normal limits will improve Outcome: Progressing Goal: Postoperative complications will be avoided or minimized Outcome: Progressing Goal: Diagnostic test results will improve Outcome: Progressing

## 2020-07-30 NOTE — Discharge Summary (Addendum)
Physician Discharge Summary      Patient ID: Travis BLATZ Sr. MRN: [id number] DOB/AGE: 1950-08-24 70 y.o.  Admit date: 07/27/2020 Discharge date: 08/02/2020  Admission Diagnoses:  Principal Problem:   Spinal stenosis of lumbar region Active Problems:   Other spondylosis with radiculopathy, lumbar region   Acute blood loss as cause of postoperative anemia   Status post lumbar spinal fusion   Discharge Diagnoses:  Same  Past Medical History:  Diagnosis Date  . ALLERGIC RHINITIS 06/24/2009  . Allergy   . Anemia    IDA  . Anxiety 11/12/2011   Adequate for discharge   . Arthritis    "all my joints" (09/30/2013)  . Arthritis of foot, right, degenerative 04/15/2014  . Balance disorder 03/12/2013  . Benign neoplasm of cecum   . Benign neoplasm of descending colon   . CAD (coronary artery disease) 06/24/2009   5 stents placed in 2007    . Chronic anticoagulation   . Chronic pain syndrome 10/27/2009   of ankle, shoulders, low back.  sciatica.   . Closed fracture of right foot 10/17/2014  . CORONARY ARTERY DISEASE 06/24/2009   a. s/p multiple PCIs - In 2008 he had a Taxus DES to the mild LAD, Endeavor DES to mid LCX and distal LCX. In January 2009 he had DES to distal LCX, mid LCX and proximal LCX. In November 2009 had BMS x 2 to the mid RCA. Cath 10/2011 with patent stents, noncardiac CP. LHC 01/2013: patent stents (noncardiac CP).  . DEGENERATIVE JOINT DISEASE 06/24/2009   Qualifier: Diagnosis of  By: [name], [name]   . Depression   . Depression with anxiety    Prior suicide attempt(08/25/19-pt states not suicide attempt)  . [place] DISEASE, LUMBAR 04/19/2010  . ERECTILE DYSFUNCTION, ORGANIC 05/30/2010  . Essential hypertension 06/24/2009   Qualifier: Diagnosis of  By: [name], [name]   . Fibromyalgia   . Fracture dislocation of ankle joint 09/02/2015  . Gait disorder 03/12/2013  . General weakness 07/14/2014  . GERD (gastroesophageal reflux disease) 09/08/2015   08/25/15-pt states was  cardiac origin, not GERD  . Hand joint pain 06/10/2013  . Heart murmur   . Hepatitis C    treated pt. unknown with what he was a teenager  . History of kidney stones   . Hyperlipidemia 07/15/2009   Qualifier: Diagnosis of  By: Aundra Dubin, MD, [place]    . HYPERLIPIDEMIA-MIXED 07/15/2009   08/25/19-pt state cholesterol was normal range  . HYPERTENSION 06/24/2009  . Insomnia 10/04/2011  . Irregular heart beat   . Left hip pain 03/12/2013   Injected under ultrasound guidance on June 24, 2013   . Major depression 09/13/2015  . Myocardial infarction ([place]) 2008  . Non-cardiac chest pain 10/2011, 01/2013  . Obesity   . Occult blood positive stool 10/17/2014  . Open ankle fracture 09/02/2015  . OSA (obstructive sleep apnea)    not using CPAP (09/30/2013)  . Pain of right thumb 04/03/2013  . Pneumonia   . PPD positive 04/08/2015  . Pre-ulcerative corn or callous 02/06/2013  . Rotator cuff tear arthropathy of both shoulders 06/10/2013   History of bilateral shoulder cuff surgery for rotator cuff tears. Reports increase in pain 09/11/2015 during physical therapy of the left shoulder.   [name] SCIATICA, LEFT 04/19/2010   Qualifier: Diagnosis of  By: [name], [name]   . Sleep apnea    wears cpap 08/25/19-States not using due to nasal stuffiness.  [name] Spinal stenosis  in cervical region 09/26/2013  . Spinal stenosis, lumbar region, with neurogenic claudication 09/26/2013  . Type II diabetes mellitus ([place]) 2012   no meds in 09/2014.   [name] Umbilical hernia   . Uncontrolled type 2 DM with peripheral circulatory disorder ([place]) 10/04/2013   08/25/19- pt states A1C normal, no diabetes  . URETHRAL STRICTURE 06/24/2009   self catheterizes.     Surgeries: Procedure(s): RIGHT L1-2, LEFT L2-3, L3-4, L4-5 AND L5-S1 TRANSFORAMINAL LUMBAR INTERBODY FUSIONS, MPACT PEDICLE SCREWS AND RODS, CAGES L1-S1, LOCAL BONE GRAFT, ALLOGRAFT CANCELLOUS CHIPS, VIVIGEN on 07/27/2020   Consultants: None  Discharged Condition: Improved  Hospital  Course: Travis [name]. is an 70 y.o. male who was admitted 07/27/2020 with a chief complaint of No chief complaint on file. , and found to have a diagnosis of Spinal stenosis of lumbar region.  He was brought to the operating room on 07/27/2020 and underwent the above named procedures.    He was given perioperative antibiotics:  Anti-infectives (From admission, onward)   Start     Dose/Rate Route Frequency Ordered Stop   07/28/20 0100  ceFAZolin (ANCEF) IVPB 2g/100 mL premix        2 g 200 mL/hr over 30 Minutes Intravenous Every 8 hours 07/27/20 2156 07/28/20 1019   07/27/20 2115  ceFAZolin (ANCEF) IVPB 2g/100 mL premix  Status:  Discontinued        2 g 200 mL/hr over 30 Minutes Intravenous Every 8 hours 07/27/20 2024 07/27/20 2156   07/27/20 1756  vancomycin (VANCOCIN) powder  Status:  Discontinued          As needed 07/27/20 1756 07/27/20 1905   07/27/20 0600  ceFAZolin (ANCEF) 3 g in dextrose 5 % 50 mL IVPB  Status:  Discontinued        3 g 100 mL/hr over 30 Minutes Intravenous On call to O.R. 07/26/20 1407 07/27/20 2017    Intraoperative blood loss was close to 2,800 cc and he received 1,100 cc cell saver blood return and 2 units of PRBCs. He had intraoperative neuromonitoring with no change in the values with resistance of all pedicle screws at greater than 20 with the exception of left L5 that measured 7. The intraop accessment demonstrated no nerve compression by hardware. The original surgery planned was a 5 level TLIF however the intraoperative  Evaluation of each disc suggested that the discs were not mobile and had severe narrowing and even bone fusion posteriorly suggesting than decompression and posterior fusion would be stable. Therefore the patient underwent right L1-2 and Left L2-3, L3-4, L4-5 and L5-S1 facetectomy decompressing the areas of spondylosis causing foramenal stenosis. Then posterior fusion with posterior segmental instrumentation was carried out. He recovered in  the PACU uneventfully and he was transferred to 4NP. Foley catheter was discontinued on POD#1 and hgb was 14 on POD #1. BMET showed elevated blood sugar but otherwise normal renal function and electrolytes. He has a right BKA and he was transitioned to sitting and then standing with his prosthesis. Voided normally post discontinuing foley catheter. POD #2 he was alert and oriented x4, he lives alone and SNF was undertaken as he will not have adequate assistance in his return to home. Transition team assessment and SNF was started. Radiographs of the lumbar spine were obtained and showed internal fixation with pedicle screws and rods L1 to S1 with posterior bone graft. The hardware appeared in good alignment. The following day he complained of productive cough, CXR was clear and  CBC showed no elevated WBC or left shift. He was started on magic mouthwash.  His incision dressing was changed on POD#2, no drainage or erythrema noted. Hgb on POD#3 was 9.5. CXR was negative, started magic mouthwash for throat irritation. POD# 4 awake, alert and oriented x 4. Voiding well, will start his regular. SNF was completed and he was eventually transferred to a SNF. iron supplements.  And a repeat Hgb will be obtained to ensure hgb stablizes.It returned at 10.1 stable and improving. POD#5 and #5 he remained hospitalized in stable condition awaiting insurance approval of transfer to SNF. PODD#7 alert and oriented, VSS. He was neurologically normal.  He was transferred to SNF following insurance approval on POD#7.   He was given sequential compression devices, early ambulation, and chemoprophylaxis aspirin for DVT prophylaxis.COVID testing was done prior to discharge.   He benefited maximally from their hospital stay and there were no complications.    Recent vital signs:  Vitals:   08/01/20 2352 08/02/20 0750  BP: (!) 129/55 (!) 142/59  Pulse: (!) 59 67  Resp: 20 14  Temp: 98.3 F (36.8 C) 98.7 F (37.1 C)  SpO2:  97% 98%    Recent laboratory studies:  Results for orders placed or performed during the hospital encounter of 07/27/20  SARS CORONAVIRUS 2 (TAT 6-24 HRS) Nasopharyngeal Nasopharyngeal Swab   Specimen: Nasopharyngeal Swab  Result Value Ref Range   SARS Coronavirus 2 NEGATIVE NEGATIVE  Glucose, capillary  Result Value Ref Range   Glucose-Capillary 161 (H) 70 - 99 mg/dL   Comment 1 Notify RN    Comment 2 Document in Chart   CBC  Result Value Ref Range   WBC 14.1 (H) 4.0 - 10.5 K/uL   RBC 4.51 4.22 - 5.81 MIL/uL   Hemoglobin 14.2 13.0 - 17.0 g/dL   HCT 39.9 39.0 - 52.0 %   MCV 88.5 80.0 - 100.0 fL   MCH 31.5 26.0 - 34.0 pg   MCHC 35.6 30.0 - 36.0 g/dL   RDW 12.1 11.5 - 15.5 %   Platelets 197 150 - 400 K/uL   nRBC 0.0 0.0 - 0.2 %  Basic Metabolic Panel  Result Value Ref Range   Sodium 134 (L) 135 - 145 mmol/L   Potassium 4.3 3.5 - 5.1 mmol/L   Chloride 104 98 - 111 mmol/L   CO2 22 22 - 32 mmol/L   Glucose, Bld 202 (H) 70 - 99 mg/dL   BUN 18 8 - 23 mg/dL   Creatinine, Ser 1.02 0.61 - 1.24 mg/dL   Calcium 7.9 (L) 8.9 - 10.3 mg/dL   GFR, Estimated >60 >60 mL/min   Anion gap 8 5 - 15  Glucose, capillary  Result Value Ref Range   Glucose-Capillary 192 (H) 70 - 99 mg/dL  Glucose, capillary  Result Value Ref Range   Glucose-Capillary 177 (H) 70 - 99 mg/dL  Glucose, capillary  Result Value Ref Range   Glucose-Capillary 211 (H) 70 - 99 mg/dL  Glucose, capillary  Result Value Ref Range   Glucose-Capillary 170 (H) 70 - 99 mg/dL  Glucose, capillary  Result Value Ref Range   Glucose-Capillary 191 (H) 70 - 99 mg/dL  Glucose, capillary  Result Value Ref Range   Glucose-Capillary 161 (H) 70 - 99 mg/dL  Glucose, capillary  Result Value Ref Range   Glucose-Capillary 120 (H) 70 - 99 mg/dL  Glucose, capillary  Result Value Ref Range   Glucose-Capillary 148 (H) 70 - 99 mg/dL  Glucose, capillary  Result Value Ref Range   Glucose-Capillary 181 (H) 70 - 99 mg/dL  Glucose,  capillary  Result Value Ref Range   Glucose-Capillary 161 (H) 70 - 99 mg/dL  Glucose, capillary  Result Value Ref Range   Glucose-Capillary 181 (H) 70 - 99 mg/dL  Glucose, capillary  Result Value Ref Range   Glucose-Capillary 176 (H) 70 - 99 mg/dL  CBC with Differential/Platelet  Result Value Ref Range   WBC 7.4 4.0 - 10.5 K/uL   RBC 2.96 (L) 4.22 - 5.81 MIL/uL   Hemoglobin 9.4 (L) 13.0 - 17.0 g/dL   HCT 26.3 (L) 39.0 - 52.0 %   MCV 88.9 80.0 - 100.0 fL   MCH 31.8 26.0 - 34.0 pg   MCHC 35.7 30.0 - 36.0 g/dL   RDW 12.4 11.5 - 15.5 %   Platelets 194 150 - 400 K/uL   nRBC 0.0 0.0 - 0.2 %   Neutrophils Relative % 79 %   Neutro Abs 5.9 1.7 - 7.7 K/uL   Lymphocytes Relative 7 %   Lymphs Abs 0.6 (L) 0.7 - 4.0 K/uL   Monocytes Relative 13 %   Monocytes Absolute 1.0 0.1 - 1.0 K/uL   Eosinophils Relative 0 %   Eosinophils Absolute 0.0 0.0 - 0.5 K/uL   Basophils Relative 0 %   Basophils Absolute 0.0 0.0 - 0.1 K/uL   Immature Granulocytes 1 %   Abs Immature Granulocytes 0.05 0.00 - 0.07 K/uL  Glucose, capillary  Result Value Ref Range   Glucose-Capillary 140 (H) 70 - 99 mg/dL  Glucose, capillary  Result Value Ref Range   Glucose-Capillary 261 (H) 70 - 99 mg/dL   Comment 1 Notify RN    Comment 2 Document in Chart   Glucose, capillary  Result Value Ref Range   Glucose-Capillary 167 (H) 70 - 99 mg/dL   Comment 1 Notify RN    Comment 2 Document in Chart   CBC with Differential/Platelet  Result Value Ref Range   WBC 6.7 4.0 - 10.5 K/uL   RBC 3.45 (L) 4.22 - 5.81 MIL/uL   Hemoglobin 10.7 (L) 13.0 - 17.0 g/dL   HCT 31.3 (L) 39.0 - 52.0 %   MCV 90.7 80.0 - 100.0 fL   MCH 31.0 26.0 - 34.0 pg   MCHC 34.2 30.0 - 36.0 g/dL   RDW 12.2 11.5 - 15.5 %   Platelets 247 150 - 400 K/uL   nRBC 0.3 (H) 0.0 - 0.2 %   Neutrophils Relative % 80 %   Neutro Abs 5.4 1.7 - 7.7 K/uL   Lymphocytes Relative 9 %   Lymphs Abs 0.6 (L) 0.7 - 4.0 K/uL   Monocytes Relative 10 %   Monocytes Absolute 0.7  0.1 - 1.0 K/uL   Eosinophils Relative 0 %   Eosinophils Absolute 0.0 0.0 - 0.5 K/uL   Basophils Relative 0 %   Basophils Absolute 0.0 0.0 - 0.1 K/uL   Immature Granulocytes 1 %   Abs Immature Granulocytes 0.07 0.00 - 0.07 K/uL  Glucose, capillary  Result Value Ref Range   Glucose-Capillary 164 (H) 70 - 99 mg/dL   Comment 1 Notify RN    Comment 2 Document in Chart   Glucose, capillary  Result Value Ref Range   Glucose-Capillary 239 (H) 70 - 99 mg/dL   Comment 1 Notify RN    Comment 2 Document in Chart   Glucose, capillary  Result Value Ref Range   Glucose-Capillary 154 (H) 70 - 99 mg/dL  Comment 1 Notify RN    Comment 2 Document in Chart   Glucose, capillary  Result Value Ref Range   Glucose-Capillary 236 (H) 70 - 99 mg/dL   Comment 1 Notify RN    Comment 2 Document in Chart   Glucose, capillary  Result Value Ref Range   Glucose-Capillary 183 (H) 70 - 99 mg/dL   Comment 1 Notify RN    Comment 2 Document in Chart   Glucose, capillary  Result Value Ref Range   Glucose-Capillary 193 (H) 70 - 99 mg/dL   Comment 1 Notify RN    Comment 2 Document in Chart   Glucose, capillary  Result Value Ref Range   Glucose-Capillary 260 (H) 70 - 99 mg/dL  I-STAT 7, (LYTES, BLD GAS, ICA, H+H)  Result Value Ref Range   pH, Arterial 7.340 (L) 7.350 - 7.450   pCO2 arterial 48.2 (H) 32.0 - 48.0 mmHg   pO2, Arterial 279 (H) 83.0 - 108.0 mmHg   Bicarbonate 26.0 20.0 - 28.0 mmol/L   TCO2 27 22 - 32 mmol/L   O2 Saturation 100.0 %   Acid-Base Excess 0.0 0.0 - 2.0 mmol/L   Sodium 137 135 - 145 mmol/L   Potassium 4.3 3.5 - 5.1 mmol/L   Calcium, Ion 1.18 1.15 - 1.40 mmol/L   HCT 36.0 (L) 39.0 - 52.0 %   Hemoglobin 12.2 (L) 13.0 - 17.0 g/dL   Patient temperature 37.1 C    Sample type ARTERIAL   I-STAT, chem 8  Result Value Ref Range   Sodium 135 135 - 145 mmol/L   Potassium 4.7 3.5 - 5.1 mmol/L   Chloride 103 98 - 111 mmol/L   BUN 18 8 - 23 mg/dL   Creatinine, Ser 0.80 0.61 - 1.24 mg/dL    Glucose, Bld 168 (H) 70 - 99 mg/dL   Calcium, Ion 1.15 1.15 - 1.40 mmol/L   TCO2 25 22 - 32 mmol/L   Hemoglobin 12.2 (L) 13.0 - 17.0 g/dL   HCT 36.0 (L) 39.0 - 52.0 %  I-STAT 7, (LYTES, BLD GAS, ICA, H+H)  Result Value Ref Range   pH, Arterial 7.340 (L) 7.350 - 7.450   pCO2 arterial 53.1 (H) 32.0 - 48.0 mmHg   pO2, Arterial 198 (H) 83.0 - 108.0 mmHg   Bicarbonate 28.6 (H) 20.0 - 28.0 mmol/L   TCO2 30 22 - 32 mmol/L   O2 Saturation 100.0 %   Acid-Base Excess 2.0 0.0 - 2.0 mmol/L   Sodium 136 135 - 145 mmol/L   Potassium 4.1 3.5 - 5.1 mmol/L   Calcium, Ion 1.24 1.15 - 1.40 mmol/L   HCT 38.0 (L) 39.0 - 52.0 %   Hemoglobin 12.9 (L) 13.0 - 17.0 g/dL   Sample type ARTERIAL    *Note: Due to a large number of results and/or encounters for the requested time period, some results have not been displayed. A complete set of results can be found in Results Review.    Discharge Medications:   Allergies as of 08/02/2020   No Known Allergies     Medication List    STOP taking these medications   amoxicillin 500 MG capsule Commonly known as: AMOXIL   predniSONE 10 MG tablet Commonly known as: DELTASONE     TAKE these medications   albuterol 108 (90 Base) MCG/ACT inhaler Commonly known as: VENTOLIN HFA Inhale 2 puffs into the lungs every 6 (six) hours as needed for wheezing or shortness of breath.   ARIPiprazole 10 MG  tablet Commonly known as: ABILIFY Take 1 tablet (10 mg total) by mouth daily.   aspirin 81 MG chewable tablet Chew 1 tablet (81 mg total) by mouth daily.   atorvastatin 20 MG tablet Commonly known as: LIPITOR Take 1 tablet (20 mg total) by mouth daily.   Azelastine-Fluticasone 137-50 MCG/ACT Susp Use both nostrils as directed twice daily What changed:   how much to take  how to take this  when to take this  additional instructions   benztropine 0.5 MG tablet Commonly known as: COGENTIN Take 1 tablet (0.5 mg total) by mouth at bedtime.    buprenorphine 20 MCG/HR Ptwk Commonly known as: BUTRANS Place 1 patch onto the skin once a week for 7 days.   calcium carbonate 500 MG chewable tablet Commonly known as: TUMS - dosed in mg elemental calcium Chew 1 tablet by mouth 3 (three) times daily with meals.   celecoxib 200 MG capsule Commonly known as: CELEBREX Take 1 capsule (200 mg total) by mouth 2 (two) times daily.   DULoxetine 30 MG capsule Commonly known as: CYMBALTA Take 1 capsule (30 mg total) by mouth 2 (two) times daily.   Fish Oil 1000 MG Caps Take 1,000 mg by mouth at bedtime.   hydrochlorothiazide 25 MG tablet Commonly known as: HYDRODIURIL Take 1 tablet (25 mg total) by mouth daily.   HYDROcodone-acetaminophen 10-325 MG tablet Commonly known as: NORCO Take 1 tablet by mouth every 6 (six) hours as needed for moderate pain.   iron polysaccharides 150 MG capsule Commonly known as: Nu-Iron Take 1 capsule (150 mg total) by mouth daily.   melatonin 5 MG Tabs Take 15 mg by mouth at bedtime.   methocarbamol 500 MG tablet Commonly known as: ROBAXIN Take 1 tablet (500 mg total) by mouth every 8 (eight) hours as needed for muscle spasms.   Myrbetriq 50 MG Tb24 tablet Generic drug: mirabegron ER Take 50 mg by mouth at bedtime.   oxybutynin 10 MG 24 hr tablet Commonly known as: DITROPAN-XL Take 10 mg by mouth daily.   oxymetazoline 0.05 % nasal spray Commonly known as: AFRIN Place 1 spray into both nostrils 2 (two) times daily as needed for congestion.   pantoprazole 40 MG tablet Commonly known as: PROTONIX Take 1 tablet (40 mg total) by mouth 2 (two) times daily. What changed: when to take this   phentermine 37.5 MG capsule Take 1 capsule (37.5 mg total) by mouth every morning.   prasugrel 5 MG Tabs tablet Commonly known as: EFFIENT Take 1 tablet (5 mg total) by mouth daily.   pregabalin 200 MG capsule Commonly known as: LYRICA Take 1 capsule (200 mg total) by mouth in the morning, at noon,  and at bedtime.   sildenafil 100 MG tablet Commonly known as: VIAGRA Take 100 mg by mouth daily as needed for erectile dysfunction.   tamsulosin 0.4 MG Caps capsule Commonly known as: FLOMAX Take 0.4 mg by mouth 2 (two) times daily.   Testosterone 1.62 % Gel Place 1 Pump onto the skin daily.   traZODone 150 MG tablet Commonly known as: DESYREL Take 1 tablet (150 mg total) by mouth at bedtime.   Zenpep 40000-126000 units Cpep Generic drug: Pancrelipase (Lip-Prot-Amyl) Take 2 capsules (80,000 units) with meals and 1 capsule (40,000 units) with each snack What changed: See the new instructions.   zolpidem 10 MG tablet Commonly known as: AMBIEN Take 1 tablet (10 mg total) by mouth at bedtime as needed for sleep.  Durable Medical Equipment  (From admission, onward)         Start     Ordered   07/27/20 1926  DME Walker rolling  Once       Question:  Patient needs a walker to treat with the following condition  Answer:  Status post lumbar spinal fusion   07/27/20 1925   07/27/20 1926  DME 3 n 1  Once        07/27/20 1925          Diagnostic Studies: DG Chest 2 View  Result Date: 07/30/2020 CLINICAL DATA:  Cough EXAM: CHEST - 2 VIEW COMPARISON:  July 23, 2020 FINDINGS: Lungs are clear. Heart size and pulmonary vascularity are normal. No adenopathy. No pneumothorax. No bone lesions. IMPRESSION: Lungs clear.  Cardiac silhouette within normal limits. Electronically Signed   By: Lowella Grip III M.D.   On: 07/30/2020 15:39   DG Chest 2 View  Result Date: 07/24/2020 CLINICAL DATA:  Preoperative study prior to lumbar fusion. EXAM: CHEST - 2 VIEW COMPARISON:  August 15, 2018 FINDINGS: Wanting of the costophrenic angles based on the lateral view. The heart size remains borderline. The hila and mediastinum are unremarkable. No pneumothorax. The lungs are clear. No acute abnormalities otherwise seen. IMPRESSION: Probable tiny effusions seen on the lateral view. No  other acute abnormalities. Electronically Signed   By: Dorise Bullion III M.D   On: 07/24/2020 14:44   DG Lumbar Spine 2-3 Views  Result Date: 07/29/2020 CLINICAL DATA:  Status post lumbar fusion. EXAM: LUMBAR SPINE - 2-3 VIEW COMPARISON:  Intraoperative films from 07/27/2020 FINDINGS: Lumbosacral fusion hardware noted from L1 to S1 with pedicle screws and posterior rods. There is also evidence of posterolateral bone grafting material. No complicating features are identified. IMPRESSION: Lumbosacral fusion hardware in good position without complicating features. Electronically Signed   By: Marijo Sanes M.D.   On: 07/29/2020 13:34   DG Lumbar Spine 2-3 Views  Result Date: 07/27/2020 CLINICAL DATA:  L1 through S1 PLIF EXAM: LUMBAR SPINE - 2-3 VIEW; DG C-ARM 1-60 MIN COMPARISON:  Radiograph 04/28/2020 FINDINGS: Six fluoroscopic spot views obtained in the operating room in frontal and lateral projections. Intra pedicular screws throughout the lumbar spine, levels not well delineated on provided views. Total fluoroscopy time 6 minutes 6 seconds. Total dose 563.7 mGy. IMPRESSION: Intraoperative fluoroscopic spot views during lumbar fusion. Electronically Signed   By: Keith Rake M.D.   On: 07/27/2020 19:24   DG C-Arm 1-60 Min  Result Date: 07/27/2020 CLINICAL DATA:  L1 through S1 PLIF EXAM: LUMBAR SPINE - 2-3 VIEW; DG C-ARM 1-60 MIN COMPARISON:  Radiograph 04/28/2020 FINDINGS: Six fluoroscopic spot views obtained in the operating room in frontal and lateral projections. Intra pedicular screws throughout the lumbar spine, levels not well delineated on provided views. Total fluoroscopy time 6 minutes 6 seconds. Total dose 563.7 mGy. IMPRESSION: Intraoperative fluoroscopic spot views during lumbar fusion. Electronically Signed   By: Keith Rake M.D.   On: 07/27/2020 19:24    Disposition: Discharge disposition: 03-Skilled Nursing Facility       Discharge Instructions    Call MD / Call 911    Complete by: As directed    If you experience chest pain or shortness of breath, CALL 911 and be transported to the hospital emergency room.  If you develope a fever above 101 F, pus (white drainage) or increased drainage or redness at the wound, or calf pain, call your surgeon's office.  Call MD / Call 911   Complete by: As directed    If you experience chest pain or shortness of breath, CALL 911 and be transported to the hospital emergency room.  If you develope a fever above 101 F, pus (white drainage) or increased drainage or redness at the wound, or calf pain, call your surgeon's office.   Constipation Prevention   Complete by: As directed    Drink plenty of fluids.  Prune juice may be helpful.  You may use a stool softener, such as Colace (over the counter) 100 mg twice a day.  Use MiraLax (over the counter) for constipation as needed.   Constipation Prevention   Complete by: As directed    Drink plenty of fluids.  Prune juice may be helpful.  You may use a stool softener, such as Colace (over the counter) 100 mg twice a day.  Use MiraLax (over the counter) for constipation as needed.   Diet - low sodium heart healthy   Complete by: As directed    Diet - low sodium heart healthy   Complete by: As directed    Discharge instructions   Complete by: As directed    Call if there is increasing drainage, fever greater than 101.5, severe head aches, and worsening nausea or light sensitivity. If shortness of breath, bloody cough or chest tightness or pain go to an emergency room. No lifting greater than 10 lbs. Avoid bending, stooping and twisting. Use brace when sitting and out of bed even to go to bathroom. Walk in house for first 2 weeks then may start to get out slowly increasing distances up to one mile by 4-6 weeks post op. After 5 days may shower and change dressing following bathing with shower.When bathing remove the brace shower and replace brace before getting out of the shower.  When in a recliner he may remove or loosen front of brace. If drainage, keep dry dressing and do not bathe the incision, use an moisture impervious dressing. Please call and return for scheduled follow up appointment 2 weeks from the time of surgery.  May bath and change dressing daily lumbar spine. Start ferrous gluconate for iron supplement. Hydrocodone for pain. Muscle relaxer.   Driving restrictions   Complete by: As directed    No driving for 3 weeks   Increase activity slowly as tolerated   Complete by: As directed    Increase activity slowly as tolerated   Complete by: As directed    Lifting restrictions   Complete by: As directed    No lifting for 8 weeks         Signed: [name] 08/02/2020, 8:01 AM

## 2020-07-30 NOTE — Care Management Important Message (Signed)
Important Message  Patient Details  Name: Travis [name] Sr. MRN: [id number] Date of Birth: 06-26-1950   Medicare Important Message Given:  Yes     Yuma Pacella 07/30/2020, 3:14 PM

## 2020-07-30 NOTE — NC FL2 (Signed)
[place] LEVEL OF CARE SCREENING TOOL     IDENTIFICATION  Patient Name: Travis SALSGIVER Sr. Birthdate: 1950/06/03 Sex: male Admission Date (Current Location): 07/27/2020  [facility] and [place] Number:  Herbalist and Address:         Provider Number: [id number]  Attending Physician Name and Address:  Jessy Oto, MD  Relative Name and Phone Number:       Current Level of Care: Hospital Recommended Level of Care: [place] Prior Approval Number:    Date Approved/Denied:   PASRR Number: [id number]  Discharge Plan: SNF    Current Diagnoses: Patient Active Problem List   Diagnosis Date Noted  . Status post lumbar spinal fusion 07/27/2020  . Nasal sinus polyp 06/16/2020  . Vitamin D deficiency 02/24/2020  . Cough 07/29/2019  . Wheezing 07/29/2019  . Possible exposure to STD 07/24/2019  . Unilateral primary osteoarthritis, left hip 06/06/2019  . Status post total replacement of left hip 06/06/2019  . S/P laparoscopic sleeve gastrectomy 03/03/2019  . Rash 01/24/2019  . Fever 08/15/2018  . UTI (urinary tract infection) 08/15/2018  . Fall at home, initial encounter 08/15/2018  . Chronic anemia 08/15/2018  . Hypokalemia 08/15/2018  . Bowel incontinence 07/25/2018  . Other spondylosis with radiculopathy, lumbar region 07/16/2018  . Status post lumbar laminectomy 07/16/2018  . Status post THR (total hip replacement) 04/17/2018  . Unilateral primary osteoarthritis, right hip   . Morbid obesity with BMI of 40.0-44.9, adult ([place]) 02/28/2018  . Myocardial infarction ([place]) 02/25/2018  . Coronary artery disease involving native coronary artery of native heart 02/25/2018  . PAD (peripheral artery disease) ([place]) 02/25/2018  . S/P BKA (below knee amputation) unilateral, right ([place]) 02/25/2018  . Sacroiliitis ([place]) 02/25/2018  . SOB (shortness of breath) 01/03/2018  . Acute on chronic heart failure ([place]) 01/03/2018  . Severe right  groin pain 12/20/2017  . Morbid obesity ([place]) 10/09/2017  . Low back pain 07/13/2017  . Leukoplakia, tongue 01/26/2017  . Skin lesion 10/20/2016  . S/P unilateral BKA (below knee amputation), right ([place]) 06/14/2016  . Charcot foot due to diabetes mellitus ([place])   . Charcot's arthropathy associated with type 2 diabetes mellitus ([place]) 04/11/2016  . Preventative health care 11/05/2015  . Major depression 09/13/2015  . S/P TKR (total knee replacement) bilaterally 09/13/2015  . GERD (gastroesophageal reflux disease) 09/08/2015  . S/P laparoscopic hernia repair 05/11/2015  . PPD positive 04/08/2015  . Benign neoplasm of descending colon   . Benign neoplasm of cecum   . Acute blood loss anemia   . Chronic anticoagulation   . Occult blood positive stool 10/17/2014  . General weakness 07/14/2014  . Urinary incontinence 07/14/2014  . Headache(784.0) 10/15/2013  . Spinal stenosis in cervical region 09/26/2013  . Spinal stenosis of lumbar region 09/26/2013  . Hand joint pain 06/10/2013  . Rotator cuff tear arthropathy of both shoulders 06/10/2013  . Skin lesion of cheek 05/01/2013  . Pain of right thumb 04/03/2013  . Balance disorder 03/12/2013  . Gait disorder 03/12/2013  . Tremor 03/12/2013  . Left hip pain 03/12/2013  . Pre-ulcerative corn or callous 02/06/2013  . Anxiety 11/12/2011  . OSA on CPAP 11/07/2011  . Bradycardia 10/20/2011  . Insomnia 10/04/2011  . Obesity 01/12/2011  . Diabetes mellitus type 2 in obese ([place]) 09/27/2010  . ERECTILE DYSFUNCTION, ORGANIC 05/30/2010  . [place] DISEASE, LUMBAR 04/19/2010  . SCIATICA, LEFT 04/19/2010  . Chronic pain syndrome 10/27/2009  . Hyperlipidemia  07/15/2009  . Essential hypertension 06/24/2009  . Coronary artery disease involving native coronary artery of native heart without angina pectoris 06/24/2009  . Allergic rhinitis 06/24/2009  . URETHRAL STRICTURE 06/24/2009  . DEGENERATIVE JOINT DISEASE 06/24/2009  . SHOULDER PAIN, BILATERAL  06/24/2009  . FATIGUE 06/24/2009  . NEPHROLITHIASIS, HX OF 06/24/2009    Orientation RESPIRATION BLADDER Height & Weight     Self,Time,Situation,Place  Normal Continent Weight: (!) 319 lb (144.7 kg) Height:  6' 1"  (185.4 cm)  BEHAVIORAL SYMPTOMS/MOOD NEUROLOGICAL BOWEL NUTRITION STATUS      Continent Diet (please see discharge summary)  AMBULATORY STATUS COMMUNICATION OF NEEDS Skin   Limited Assist Verbally Surgical wounds (closed incision, Back)                       Personal Care Assistance Level of Assistance  Bathing,Feeding,Dressing Bathing Assistance: Limited assistance Feeding assistance: Independent Dressing Assistance: Limited assistance     Functional Limitations Info  Sight,Speech,Hearing Sight Info: Adequate (wears glasses) Hearing Info: Adequate Speech Info: Adequate    SPECIAL CARE FACTORS FREQUENCY                       Contractures Contractures Info: Not present    Additional Factors Info  Code Status,Allergies Code Status Info: FULL Allergies Info: NKA           Current Medications (07/30/2020):  This is the current hospital active medication list Current Facility-Administered Medications  Medication Dose Route Frequency Provider Last Rate Last Admin  . 0.9 %  sodium chloride infusion  250 mL Intravenous Continuous Jessy Oto, MD 1 mL/hr at 07/29/20 0003 250 mL at 07/29/20 0003  . 0.9 %  sodium chloride infusion   Intravenous Continuous Jessy Oto, MD   Stopped at 07/28/20 [phone]  . acetaminophen (TYLENOL) tablet 650 mg  650 mg Oral Q4H PRN Jessy Oto, MD       Or  . acetaminophen (TYLENOL) suppository 650 mg  650 mg Rectal Q4H PRN Jessy Oto, MD      . albuterol (PROVENTIL) (2.5 MG/3ML) 0.083% nebulizer solution 3 mL  3 mL Inhalation Q6H PRN Jessy Oto, MD      . alum & mag hydroxide-simeth (MAALOX/MYLANTA) 200-200-20 MG/5ML suspension 30 mL  30 mL Oral Q6H PRN Jessy Oto, MD      . ARIPiprazole (ABILIFY) tablet  10 mg  10 mg Oral Daily Jessy Oto, MD   10 mg at 07/30/20 0951  . aspirin chewable tablet 81 mg  81 mg Oral Daily Jessy Oto, MD   81 mg at 07/30/20 0950  . benztropine (COGENTIN) tablet 0.5 mg  0.5 mg Oral QHS Jessy Oto, MD   0.5 mg at 07/29/20 2124  . bisacodyl (DULCOLAX) EC tablet 5 mg  5 mg Oral Daily PRN Jessy Oto, MD      . buprenorphine [place]) 10 MCG/HR 2 patch  2 patch Transdermal Weekly Jessy Oto, MD      . calcium carbonate (TUMS - dosed in mg elemental calcium) chewable tablet 200 mg of elemental calcium  1 tablet Oral TID WC Jessy Oto, MD   200 mg of elemental calcium at 07/30/20 0950  . celecoxib (CELEBREX) capsule 200 mg  200 mg Oral BID Jessy Oto, MD   200 mg at 07/30/20 0949  . dexamethasone (DECADRON) injection 4 mg  4 mg Intravenous Q6H Jessy Oto, MD  4 mg at 07/29/20 0946   Or  . dexamethasone (DECADRON) tablet 4 mg  4 mg Oral Q6H Jessy Oto, MD   4 mg at 07/30/20 0503  . docusate sodium (COLACE) capsule 100 mg  100 mg Oral BID Jessy Oto, MD   100 mg at 07/30/20 0950  . DULoxetine (CYMBALTA) DR capsule 30 mg  30 mg Oral BID Jessy Oto, MD   30 mg at 07/30/20 0950  . hydrochlorothiazide (HYDRODIURIL) tablet 25 mg  25 mg Oral Daily Jessy Oto, MD   25 mg at 07/30/20 0950  . iron polysaccharides (NIFEREX) capsule 150 mg  150 mg Oral Daily Jessy Oto, MD   150 mg at 07/30/20 0951  . lipase/protease/amylase (CREON) capsule 36,000 Units  36,000 Units Oral PRN Jessy Oto, MD      . lipase/protease/amylase (CREON) capsule 72,000 Units  72,000 Units Oral TID with meals Jessy Oto, MD   72,000 Units at 07/30/20 [phone]  . loperamide (IMODIUM) capsule 2 mg  2 mg Oral PRN Jessy Oto, MD   2 mg at 07/30/20 0950  . magic mouthwash  5 mL Oral QID Jessy Oto, MD      . melatonin tablet 15 mg  15 mg Oral QHS Jessy Oto, MD   15 mg at 07/29/20 2123  . menthol-cetylpyridinium (CEPACOL) lozenge 3 mg  1 lozenge Oral PRN  Jessy Oto, MD       Or  . phenol (CHLORASEPTIC) mouth spray 1 spray  1 spray Mouth/Throat PRN Jessy Oto, MD      . methocarbamol (ROBAXIN) tablet 500 mg  500 mg Oral Q6H PRN Jessy Oto, MD   500 mg at 07/30/20 0950   Or  . methocarbamol (ROBAXIN) 500 mg in dextrose 5 % 50 mL IVPB  500 mg Intravenous Q6H PRN Jessy Oto, MD      . mirabegron ER [facility]) tablet 50 mg  50 mg Oral QHS Jessy Oto, MD   50 mg at 07/29/20 2123  . morphine 2 MG/ML injection 1 mg  1 mg Intravenous Q3H PRN Jessy Oto, MD   1 mg at 07/29/20 0525  . omega-3 acid ethyl esters (LOVAZA) capsule 1 g  1 g Oral Daily Jessy Oto, MD   1 g at 07/30/20 0950  . ondansetron (ZOFRAN) tablet 4 mg  4 mg Oral Q6H PRN Jessy Oto, MD       Or  . ondansetron [facility]) injection 4 mg  4 mg Intravenous Q6H PRN Jessy Oto, MD      . oxybutynin (DITROPAN-XL) 24 hr tablet 10 mg  10 mg Oral Daily Jessy Oto, MD   10 mg at 07/30/20 0952  . oxyCODONE (Oxy IR/ROXICODONE) immediate release tablet 10 mg  10 mg Oral Q3H PRN Jessy Oto, MD   10 mg at 07/30/20 0950  . oxyCODONE (Oxy IR/ROXICODONE) immediate release tablet 5 mg  5 mg Oral Q3H PRN Jessy Oto, MD      . oxymetazoline (AFRIN) 0.05 % nasal spray 1 spray  1 spray Each Nare BID PRN Jessy Oto, MD      . pantoprazole (PROTONIX) EC tablet 40 mg  40 mg Oral BID Jessy Oto, MD   40 mg at 07/30/20 0950  . polyethylene glycol (MIRALAX / GLYCOLAX) packet 17 g  17 g Oral Daily PRN Jessy Oto, MD      .  prasugrel (EFFIENT) tablet 5 mg  5 mg Oral Daily Jessy Oto, MD   5 mg at 07/30/20 0951  . pregabalin (LYRICA) capsule 200 mg  200 mg Oral TID Jessy Oto, MD   200 mg at 07/30/20 0950  . sodium chloride flush (NS) 0.9 % injection 3 mL  3 mL Intravenous Q12H Jessy Oto, MD   3 mL at 07/29/20 2125  . sodium chloride flush (NS) 0.9 % injection 3 mL  3 mL Intravenous PRN Jessy Oto, MD      . sodium phosphate (FLEET) 7-19 GM/118ML  enema 1 enema  1 enema Rectal Once PRN Jessy Oto, MD      . tamsulosin (FLOMAX) capsule 0.4 mg  0.4 mg Oral BID Jessy Oto, MD   0.4 mg at 07/30/20 0950  . traZODone (DESYREL) tablet 150 mg  150 mg Oral QHS Jessy Oto, MD   150 mg at 07/29/20 2124  . zolpidem (AMBIEN) tablet 5 mg  5 mg Oral QHS PRN Jessy Oto, MD         Discharge Medications: Please see discharge summary for a list of discharge medications.  Relevant Imaging Results:  Relevant Lab Results:   Additional Information SSN # [id number] [place] COVID-19 Vaccine 09/15/2019 , 08/22/2019 -patient states he has received booster shot  Vinie Sill, LCSWA

## 2020-07-30 NOTE — TOC Initial Note (Addendum)
Transition of Care (TOC) - Initial/Assessment Note    Patient Details  Name: Travis ALLEN Sr. MRN: [id number] Date of Birth: 1950-10-31  Transition of Care [facility]) CM/SW Contact:    Vinie Sill, [place] Phone Number: 07/30/2020, 11:26 AM  Clinical Narrative:                  CSW visit with patient at bedside. CSW introduced self and explained role. CSW discussed PT recommendation of short term rehab at [facility]. Patient states his niece, Travis [name] and her spouse and 2 kids is in the home. He states they work during the day and is unable to assist or provide the level of care that is needed. Patient states he is agreeable to SNF placement. He has been to [facility] and [facility]. [facility] is his preferred SNF. CSW explained the SNF process. Patient states he has received covid vaccines and booster shot. Patient states no questions or concerns at this time.   CSW will provide bed offers once available Patient will need insurance authorization prior to discharge CSW will continue to monitor and assist with discharge planning.   Thurmond Butts, MSW, LCSW Clinical Social Worker     Expected Discharge Plan: Skilled Nursing Facility Barriers to Discharge: Insurance Authorization,SNF Pending bed offer   Patient Goals and CMS Choice Patient states their goals for this hospitalization and ongoing recovery are:: to be able to walk again      Expected Discharge Plan and Services Expected Discharge Plan: [place] In-house Referral: Clinical Social Work     Living arrangements for the past 2 months: Single Family Home                                      Prior Living Arrangements/Services Living arrangements for the past 2 months: Single Family Home Lives with:: Self,Relatives Patient language and need for interpreter reviewed:: No        Need for Family Participation in Patient Care: Yes (Comment) Care giver support system in place?:  Yes (comment)   Criminal Activity/Legal Involvement Pertinent to Current Situation/Hospitalization: No - Comment as needed  Activities of Daily Living Home Assistive Devices/Equipment: Prosthesis ADL Screening (condition at time of admission) Patient's cognitive ability adequate to safely complete daily activities?: Yes Is the patient deaf or have difficulty hearing?: No Does the patient have difficulty seeing, even when wearing glasses/contacts?: No Does the patient have difficulty concentrating, remembering, or making decisions?: No Patient able to express need for assistance with ADLs?: No Does the patient have difficulty dressing or bathing?: No Independently performs ADLs?: Yes (appropriate for developmental age) Does the patient have difficulty walking or climbing stairs?: Yes Weakness of Legs: Left Weakness of Arms/Hands: Right  Permission Sought/Granted Permission sought to share information with : Family Supports Permission granted to share information with : Yes, Verbal Permission Granted  Share Information with NAME: Travis [name] Hardin(Niece) [phone] and Travis [name] (brother) [phone]  Permission granted to share info w AGENCY: SNFs  Permission granted to share info w Relationship: Niece and brother  Permission granted to share info w Contact Information: Travis [name] Hardin(Niece) [phone] and Travis [name] (brother) [phone]  Emotional Assessment Appearance:: Appears stated age Attitude/Demeanor/Rapport: Engaged Affect (typically observed): Accepting,Pleasant,Appropriate Orientation: : Oriented to Self,Oriented to Place,Oriented to Situation,Oriented to  Time Alcohol / Substance Use: Not Applicable Psych Involvement: No (comment)  Admission diagnosis:  Status post lumbar spinal  fusion [Z98.1] Patient Active Problem List   Diagnosis Date Noted  . Status post lumbar spinal fusion 07/27/2020  . Nasal sinus polyp 06/16/2020  . Vitamin D deficiency  02/24/2020  . Cough 07/29/2019  . Wheezing 07/29/2019  . Possible exposure to STD 07/24/2019  . Unilateral primary osteoarthritis, left hip 06/06/2019  . Status post total replacement of left hip 06/06/2019  . S/P laparoscopic sleeve gastrectomy 03/03/2019  . Rash 01/24/2019  . Fever 08/15/2018  . UTI (urinary tract infection) 08/15/2018  . Fall at home, initial encounter 08/15/2018  . Chronic anemia 08/15/2018  . Hypokalemia 08/15/2018  . Bowel incontinence 07/25/2018  . Other spondylosis with radiculopathy, lumbar region 07/16/2018    Class: Chronic  . Status post lumbar laminectomy 07/16/2018  . Status post THR (total hip replacement) 04/17/2018  . Unilateral primary osteoarthritis, right hip   . Morbid obesity with BMI of 40.0-44.9, adult ([place]) 02/28/2018  . Myocardial infarction ([place]) 02/25/2018  . Coronary artery disease involving native coronary artery of native heart 02/25/2018  . PAD (peripheral artery disease) ([place]) 02/25/2018  . S/P BKA (below knee amputation) unilateral, right ([place]) 02/25/2018  . Sacroiliitis ([place]) 02/25/2018  . SOB (shortness of breath) 01/03/2018  . Acute on chronic heart failure ([place]) 01/03/2018  . Severe right groin pain 12/20/2017  . Morbid obesity ([place]) 10/09/2017  . Low back pain 07/13/2017  . Leukoplakia, tongue 01/26/2017  . Skin lesion 10/20/2016  . S/P unilateral BKA (below knee amputation), right ([place]) 06/14/2016  . Charcot foot due to diabetes mellitus ([place])   . Charcot's arthropathy associated with type 2 diabetes mellitus ([place]) 04/11/2016  . Preventative health care 11/05/2015  . Major depression 09/13/2015  . S/P TKR (total knee replacement) bilaterally 09/13/2015  . GERD (gastroesophageal reflux disease) 09/08/2015  . S/P laparoscopic hernia repair 05/11/2015  . PPD positive 04/08/2015  . Benign neoplasm of descending colon   . Benign neoplasm of cecum   . Acute blood loss anemia   . Chronic anticoagulation   . Occult blood  positive stool 10/17/2014  . General weakness 07/14/2014  . Urinary incontinence 07/14/2014  . Headache(784.0) 10/15/2013  . Spinal stenosis in cervical region 09/26/2013    Class: Chronic  . Spinal stenosis of lumbar region 09/26/2013    Class: Chronic  . Hand joint pain 06/10/2013  . Rotator cuff tear arthropathy of both shoulders 06/10/2013  . Skin lesion of cheek 05/01/2013  . Pain of right thumb 04/03/2013  . Balance disorder 03/12/2013  . Gait disorder 03/12/2013  . Tremor 03/12/2013  . Left hip pain 03/12/2013  . Pre-ulcerative corn or callous 02/06/2013  . Anxiety 11/12/2011  . OSA on CPAP 11/07/2011  . Bradycardia 10/20/2011  . Insomnia 10/04/2011  . Obesity 01/12/2011  . Diabetes mellitus type 2 in obese ([place]) 09/27/2010  . ERECTILE DYSFUNCTION, ORGANIC 05/30/2010  . [place] DISEASE, LUMBAR 04/19/2010  . SCIATICA, LEFT 04/19/2010  . Chronic pain syndrome 10/27/2009  . Hyperlipidemia 07/15/2009  . Essential hypertension 06/24/2009  . Coronary artery disease involving native coronary artery of native heart without angina pectoris 06/24/2009  . Allergic rhinitis 06/24/2009  . URETHRAL STRICTURE 06/24/2009  . DEGENERATIVE JOINT DISEASE 06/24/2009  . SHOULDER PAIN, BILATERAL 06/24/2009  . FATIGUE 06/24/2009  . NEPHROLITHIASIS, HX OF 06/24/2009   PCP:  [name], MD Pharmacy:   [facility], [place] [address] [place] [address] Phone: [phone] Fax: [phone]  Social Determinants of Health (SDOH) Interventions    Readmission Risk Interventions No flowsheet data found.

## 2020-07-31 LAB — GLUCOSE, CAPILLARY
Glucose-Capillary: 261 mg/dL — ABNORMAL HIGH (ref 70–99)
Glucose-Capillary: 167 mg/dL — ABNORMAL HIGH (ref 70–99)
Glucose-Capillary: 164 mg/dL — ABNORMAL HIGH (ref 70–99)
Glucose-Capillary: 239 mg/dL — ABNORMAL HIGH (ref 70–99)

## 2020-07-31 LAB — CBC WITH DIFFERENTIAL/PLATELET
Abs Immature Granulocytes: 0.07 10*3/uL (ref 0.00–0.07)
Basophils Absolute: 0 10*3/uL (ref 0.0–0.1)
Basophils Relative: 0 %
Eosinophils Absolute: 0 10*3/uL (ref 0.0–0.5)
Eosinophils Relative: 0 %
HCT: 31.3 % — ABNORMAL LOW (ref 39.0–52.0)
Hemoglobin: 10.7 g/dL — ABNORMAL LOW (ref 13.0–17.0)
Immature Granulocytes: 1 %
Lymphocytes Relative: 9 %
Lymphs Abs: 0.6 10*3/uL — ABNORMAL LOW (ref 0.7–4.0)
MCH: 31 pg (ref 26.0–34.0)
MCHC: 34.2 g/dL (ref 30.0–36.0)
MCV: 90.7 fL (ref 80.0–100.0)
Monocytes Absolute: 0.7 10*3/uL (ref 0.1–1.0)
Monocytes Relative: 10 %
Neutro Abs: 5.4 10*3/uL (ref 1.7–7.7)
Neutrophils Relative %: 80 %
Platelets: 247 10*3/uL (ref 150–400)
RBC: 3.45 MIL/uL — ABNORMAL LOW (ref 4.22–5.81)
RDW: 12.2 % (ref 11.5–15.5)
WBC: 6.7 10*3/uL (ref 4.0–10.5)
nRBC: 0.3 % — ABNORMAL HIGH (ref 0.0–0.2)

## 2020-07-31 LAB — SARS CORONAVIRUS 2 (TAT 6-24 HRS): SARS Coronavirus 2: NEGATIVE

## 2020-07-31 MED ORDER — CELECOXIB 200 MG PO CAPS: ORAL_CAPSULE | ORAL | 0 refills | Status: DC

## 2020-07-31 MED ORDER — BUPRENORPHINE 20 MCG/HR TD PTWK: 1.0000 | MEDICATED_PATCH | TRANSDERMAL | 0 refills | Status: AC

## 2020-07-31 MED ORDER — ZOLPIDEM TARTRATE 10 MG PO TABS: 10.0000 mg | ORAL_TABLET | Freq: Every evening | ORAL | 0 refills | Status: DC | PRN

## 2020-07-31 MED ORDER — PREGABALIN 200 MG PO CAPS: 200.0000 mg | ORAL_CAPSULE | Freq: Three times a day (TID) | ORAL | 0 refills | Status: DC

## 2020-07-31 MED ORDER — METHOCARBAMOL 500 MG PO TABS: 500.0000 mg | ORAL_TABLET | Freq: Three times a day (TID) | ORAL | 0 refills | Status: DC | PRN

## 2020-07-31 MED ORDER — ARIPIPRAZOLE 10 MG PO TABS: 10.0000 mg | ORAL_TABLET | Freq: Every day | ORAL | 0 refills | Status: DC

## 2020-07-31 MED ORDER — FERROUS GLUCONATE 324 (38 FE) MG PO TABS
324.0000 mg | ORAL_TABLET | Freq: Two times a day (BID) | ORAL | Status: DC
  Administered 2020-07-31 – 2020-08-02 (×4): 324 mg via ORAL
  Filled 2020-07-31 (×7): qty 1

## 2020-07-31 MED ORDER — DULOXETINE HCL 30 MG PO CPEP: 30.0000 mg | ORAL_CAPSULE | Freq: Two times a day (BID) | ORAL | 0 refills | Status: DC

## 2020-07-31 MED ORDER — TRAZODONE HCL 150 MG PO TABS: 150.0000 mg | ORAL_TABLET | Freq: Every day | ORAL | 0 refills | Status: DC

## 2020-07-31 MED ORDER — PHENTERMINE HCL 37.5 MG PO CAPS: 37.5000 mg | ORAL_CAPSULE | ORAL | 0 refills | Status: DC

## 2020-07-31 MED ORDER — HYDROCODONE-ACETAMINOPHEN 10-325 MG PO TABS: 1.0000 | ORAL_TABLET | Freq: Four times a day (QID) | ORAL | 0 refills | Status: DC | PRN

## 2020-07-31 NOTE — Plan of Care (Signed)
  Problem: Bladder/Genitourinary: Goal: Urinary functional status for postoperative course will improve Outcome: Progressing

## 2020-07-31 NOTE — Plan of Care (Signed)
  Problem: Education: Goal: Ability to verbalize activity precautions or restrictions will improve Outcome: Progressing Goal: Knowledge of the prescribed therapeutic regimen will improve Outcome: Progressing Goal: Understanding of discharge needs will improve Outcome: Progressing   Problem: Activity: Goal: Ability to avoid complications of mobility impairment will improve Outcome: Progressing Goal: Ability to tolerate increased activity will improve Outcome: Progressing Goal: Will remain free from falls Outcome: Progressing   Problem: Bowel/Gastric: Goal: Gastrointestinal status for postoperative course will improve Outcome: Progressing   Problem: Clinical Measurements: Goal: Ability to maintain clinical measurements within normal limits will improve Outcome: Progressing Goal: Postoperative complications will be avoided or minimized Outcome: Progressing Goal: Diagnostic test results will improve Outcome: Progressing   Problem: Pain Management: Goal: Pain level will decrease Outcome: Progressing   Problem: Skin Integrity: Goal: Will show signs of wound healing Outcome: Progressing   Problem: Health Behavior/Discharge Planning: Goal: Identification of resources available to assist in meeting health care needs will improve Outcome: Progressing   Problem: Bladder/Genitourinary: Goal: Urinary functional status for postoperative course will improve Outcome: Progressing   Problem: Education: Goal: Ability to verbalize activity precautions or restrictions will improve Outcome: Progressing Goal: Knowledge of the prescribed therapeutic regimen will improve Outcome: Progressing Goal: Understanding of discharge needs will improve Outcome: Progressing   Problem: Activity: Goal: Ability to avoid complications of mobility impairment will improve Outcome: Progressing Goal: Ability to tolerate increased activity will improve Outcome: Progressing Goal: Will remain free from  falls Outcome: Progressing   Problem: Bowel/Gastric: Goal: Gastrointestinal status for postoperative course will improve Outcome: Progressing   Problem: Clinical Measurements: Goal: Ability to maintain clinical measurements within normal limits will improve Outcome: Progressing Goal: Postoperative complications will be avoided or minimized Outcome: Progressing   Problem: Pain Management: Goal: Pain level will decrease Outcome: Progressing   Problem: Skin Integrity: Goal: Will show signs of wound healing Outcome: Progressing   Problem: Health Behavior/Discharge Planning: Goal: Identification of resources available to assist in meeting health care needs will improve Outcome: Progressing   Problem: Bladder/Genitourinary: Goal: Urinary functional status for postoperative course will improve Outcome: Progressing  Pt ambulatory to BR and in room with 2 assists / Contact guard once assisted to feet with FWRW.  Requires Moderate assist of two persons to stand.  Balance fair with use of Rt prosthetic leg.  Recommend use of bariatric commode 2 : in 1 for toileting needs.   Patient expresses frustration with having gained weight over the past year since the death of his son 8 months ago.  The extra weight will have a bearing on his mobility and he expresses self-awareness of this fact.  States he wants assist with weight loss in the near future.

## 2020-07-31 NOTE — Progress Notes (Signed)
  Subjective: Patient stable.  Has been ambulating in the hall.  Overall pain is well controlled   Objective: Vital signs in last 24 hours: Temp:  [97.8 F (36.6 C)-98.6 F (37 C)] 98.6 F (37 C) (03/12 0717) Pulse Rate:  [48-88] 69 (03/12 0717) Resp:  [15-17] 16 (03/12 0717) BP: (135-159)/(50-66) 159/50 (03/12 0717) SpO2:  [89 %-100 %] 93 % (03/12 0717)  Intake/Output from previous day: 03/11 0701 - 03/12 0700 In: 880 [P.O.:880] Out: 3000 [Urine:3000] Intake/Output this shift: No intake/output data recorded.  Exam:  Dorsiflexion/Plantar flexion intact  Labs: Recent Labs    07/30/20 1457  HGB 9.4*   Recent Labs    07/30/20 1457  WBC 7.4  RBC 2.96*  HCT 26.3*  PLT 194   No results for input(s): NA, K, CL, CO2, BUN, CREATININE, GLUCOSE, CALCIUM in the last 72 hours. No results for input(s): LABPT, INR in the last 72 hours.  Assessment/Plan: Plan at this time is discharged to skilled nursing once we have a bed available.  This may be tomorrow or Monday.  Per Education officer, museum note today.   Landry Dyke Aithana Kushner 07/31/2020, 10:32 AM

## 2020-07-31 NOTE — Progress Notes (Signed)
     Subjective: 4 Days Post-Op Procedure(s) (LRB): RIGHT L1-2, LEFT L2-3, L3-4, L4-5 AND L5-S1 TRANSFORAMINAL LUMBAR INTERBODY FUSIONS, MPACT PEDICLE SCREWS AND RODS, CAGES L1-S1, LOCAL BONE GRAFT, ALLOGRAFT CANCELLOUS CHIPS, VIVIGEN (N/A) Awake, alert and oriented x 4. Scheduled to go to SNF today, [facility]. Obtain COVID test and CBC today.  Voiding well, moved bowels well last night. CXR negative for infiltrate, clear. CBC with Hgb 9.4 so will recheck today and order COVID rapid test for SNF so he may go to SNF. Taking vicodin for pain.  Patient reports pain as moderate.    Objective:   VITALS:  Temp:  [98 F (36.7 C)-98.6 F (37 C)] 98.6 F (37 C) (03/12 0717) Pulse Rate:  [48-88] 69 (03/12 0717) Resp:  [16-17] 16 (03/12 0717) BP: (138-159)/(50-66) 159/50 (03/12 0717) SpO2:  [89 %-100 %] 93 % (03/12 0717)  Neurologically intact ABD soft Neurovascular intact Sensation intact distally Intact pulses distally Dorsiflexion/Plantar flexion intact Incision: dressing C/D/I, no drainage and Right BKA, right leg is shorter and likely contributes to SI pain and lateral pain on opposite leg.    LABS Recent Labs    07/30/20 1457  HGB 9.4*  WBC 7.4  PLT 194   No results for input(s): NA, K, CL, CO2, BUN, CREATININE, GLUCOSE in the last 72 hours. No results for input(s): LABPT, INR in the last 72 hours.   Assessment/Plan: 4 Days Post-Op Procedure(s) (LRB): RIGHT L1-2, LEFT L2-3, L3-4, L4-5 AND L5-S1 TRANSFORAMINAL LUMBAR INTERBODY FUSIONS, MPACT PEDICLE SCREWS AND RODS, CAGES L1-S1, LOCAL BONE GRAFT, ALLOGRAFT CANCELLOUS CHIPS, VIVIGEN (N/A)  Advance diet Up with therapy D/C IV fluids Discharge to SNF  Travis [name] 07/31/2020, 12:08 PMPatient ID: Travis [name]., male   DOB: 09-Jan-1951, 70 y.o.   MRN: [id number]

## 2020-07-31 NOTE — Discharge Instructions (Signed)
    Call if there is increasing drainage, fever greater than 101.5, severe head aches, and worsening nausea or light sensitivity. If shortness of breath, bloody cough or chest tightness or pain go to an emergency room. No lifting greater than 10 lbs. Avoid bending, stooping and twisting. Use brace when sitting and out of bed even to go to bathroom. Walk in house for first 2 weeks then may start to get out slowly increasing distances up to one mile by 4-6 weeks post op. After 5 days may shower and change dressing following bathing with shower.When bathing remove the brace shower and replace brace before getting out of the shower. If drainage, keep dry dressing and do not bathe the incision, use an moisture impervious dressing. Please call and return for scheduled follow up appointment 2 weeks from the time of surgery.  May bath and change dressing daily lumbar spine. Start ferrous gluconate for iron supplement. Hydrocodone for pain. Muscle relaxer.

## 2020-07-31 NOTE — TOC Progression Note (Signed)
Transition of Care (TOC) - Progression Note    Patient Details  Name: Travis RICHERT Sr. MRN: [id number] Date of Birth: Feb 07, 1951  Transition of Care [facility]) CM/SW [place], [place] Phone Number: 07/31/2020, 9:41 AM  Clinical Narrative:    Still waiting on auth, per SNF not approved yet.    Expected Discharge Plan: Skilled Nursing Facility Barriers to Discharge: Insurance Authorization,SNF Pending bed offer  Expected Discharge Plan and Services Expected Discharge Plan: [place] In-house Referral: Clinical Social Work     Living arrangements for the past 2 months: Single Family Home                                       Social Determinants of Health (SDOH) Interventions    Readmission Risk Interventions No flowsheet data found.

## 2020-08-01 LAB — GLUCOSE, CAPILLARY
Glucose-Capillary: 154 mg/dL — ABNORMAL HIGH (ref 70–99)
Glucose-Capillary: 236 mg/dL — ABNORMAL HIGH (ref 70–99)
Glucose-Capillary: 183 mg/dL — ABNORMAL HIGH (ref 70–99)
Glucose-Capillary: 193 mg/dL — ABNORMAL HIGH (ref 70–99)

## 2020-08-01 NOTE — Progress Notes (Signed)
  Subjective: Pt stable Pain ok   Objective: Vital signs in last 24 hours: Temp:  [98 F (36.7 C)-98.2 F (36.8 C)] 98 F (36.7 C) (03/13 0527) Pulse Rate:  [42-64] 42 (03/13 0527) Resp:  [14-17] 15 (03/13 0527) BP: (125-152)/(57-67) 152/67 (03/13 0527) SpO2:  [96 %-97 %] 97 % (03/13 0527)  Intake/Output from previous day: 03/12 0701 - 03/13 0700 In: 860 [P.O.:860] Out: 2400 [Urine:2400] Intake/Output this shift: No intake/output data recorded.  Exam:  Sensation intact distally Dorsiflexion/Plantar flexion intact  Labs: Recent Labs    07/30/20 1457 07/31/20 1254  HGB 9.4* 10.7*   Recent Labs    07/30/20 1457 07/31/20 1254  WBC 7.4 6.7  RBC 2.96* 3.45*  HCT 26.3* 31.3*  PLT 194 247   No results for input(s): NA, K, CL, CO2, BUN, CREATININE, GLUCOSE, CALCIUM in the last 72 hours. No results for input(s): LABPT, INR in the last 72 hours.  Assessment/Plan: Dc to snf today   G Alphonzo Severance 08/01/2020, 7:54 AM

## 2020-08-01 NOTE — TOC Progression Note (Signed)
Transition of Care (TOC) - Progression Note    Patient Details  Name: Travis ROUTT Sr. MRN: [id number] Date of Birth: May 02, 1951  Transition of Care [facility]) CM/SW [place], [place] Phone Number: 08/01/2020, 9:45 AM  Clinical Narrative:     Insurance authorization  still pending   Thurmond Butts, MSW, LCSW Clinical Social Worker   Expected Discharge Plan: Skilled Nursing Facility Barriers to Discharge: Insurance Authorization,SNF Pending bed offer  Expected Discharge Plan and Services Expected Discharge Plan: [place] In-house Referral: Clinical Social Work     Living arrangements for the past 2 months: Single Family Home Expected Discharge Date: 08/01/20                                     Social Determinants of Health (SDOH) Interventions    Readmission Risk Interventions No flowsheet data found.

## 2020-08-02 DIAGNOSIS — Q7649 Other congenital malformations of spine, not associated with scoliosis: Secondary | ICD-10-CM

## 2020-08-02 DIAGNOSIS — R5381 Other malaise: Secondary | ICD-10-CM | POA: Diagnosis not present

## 2020-08-02 DIAGNOSIS — G4700 Insomnia, unspecified: Secondary | ICD-10-CM | POA: Diagnosis not present

## 2020-08-02 DIAGNOSIS — T8131XA Disruption of external operation (surgical) wound, not elsewhere classified, initial encounter: Secondary | ICD-10-CM | POA: Diagnosis not present

## 2020-08-02 DIAGNOSIS — M2550 Pain in unspecified joint: Secondary | ICD-10-CM | POA: Diagnosis not present

## 2020-08-02 DIAGNOSIS — F419 Anxiety disorder, unspecified: Secondary | ICD-10-CM | POA: Diagnosis not present

## 2020-08-02 DIAGNOSIS — Z7401 Bed confinement status: Secondary | ICD-10-CM | POA: Diagnosis not present

## 2020-08-02 DIAGNOSIS — F319 Bipolar disorder, unspecified: Secondary | ICD-10-CM | POA: Diagnosis not present

## 2020-08-02 DIAGNOSIS — R2689 Other abnormalities of gait and mobility: Secondary | ICD-10-CM | POA: Diagnosis not present

## 2020-08-02 DIAGNOSIS — M4726 Other spondylosis with radiculopathy, lumbar region: Secondary | ICD-10-CM | POA: Diagnosis not present

## 2020-08-02 DIAGNOSIS — E291 Testicular hypofunction: Secondary | ICD-10-CM | POA: Diagnosis not present

## 2020-08-02 DIAGNOSIS — M48061 Spinal stenosis, lumbar region without neurogenic claudication: Secondary | ICD-10-CM | POA: Diagnosis not present

## 2020-08-02 DIAGNOSIS — M4316 Spondylolisthesis, lumbar region: Secondary | ICD-10-CM | POA: Diagnosis not present

## 2020-08-02 DIAGNOSIS — M5136 Other intervertebral disc degeneration, lumbar region: Secondary | ICD-10-CM | POA: Diagnosis not present

## 2020-08-02 DIAGNOSIS — T24112A Burn of first degree of left thigh, initial encounter: Secondary | ICD-10-CM | POA: Diagnosis not present

## 2020-08-02 DIAGNOSIS — E119 Type 2 diabetes mellitus without complications: Secondary | ICD-10-CM | POA: Diagnosis not present

## 2020-08-02 DIAGNOSIS — G894 Chronic pain syndrome: Secondary | ICD-10-CM | POA: Diagnosis not present

## 2020-08-02 DIAGNOSIS — M1612 Unilateral primary osteoarthritis, left hip: Secondary | ICD-10-CM | POA: Diagnosis not present

## 2020-08-02 DIAGNOSIS — M545 Low back pain, unspecified: Secondary | ICD-10-CM | POA: Diagnosis not present

## 2020-08-02 DIAGNOSIS — F331 Major depressive disorder, recurrent, moderate: Secondary | ICD-10-CM | POA: Diagnosis not present

## 2020-08-02 DIAGNOSIS — I959 Hypotension, unspecified: Secondary | ICD-10-CM | POA: Diagnosis not present

## 2020-08-02 DIAGNOSIS — R262 Difficulty in walking, not elsewhere classified: Secondary | ICD-10-CM | POA: Diagnosis not present

## 2020-08-02 DIAGNOSIS — Z89511 Acquired absence of right leg below knee: Secondary | ICD-10-CM | POA: Diagnosis not present

## 2020-08-02 DIAGNOSIS — M4326 Fusion of spine, lumbar region: Secondary | ICD-10-CM | POA: Diagnosis not present

## 2020-08-02 DIAGNOSIS — M48062 Spinal stenosis, lumbar region with neurogenic claudication: Secondary | ICD-10-CM | POA: Diagnosis not present

## 2020-08-02 DIAGNOSIS — I2510 Atherosclerotic heart disease of native coronary artery without angina pectoris: Secondary | ICD-10-CM | POA: Diagnosis not present

## 2020-08-02 DIAGNOSIS — Z981 Arthrodesis status: Secondary | ICD-10-CM | POA: Diagnosis not present

## 2020-08-02 DIAGNOSIS — M4802 Spinal stenosis, cervical region: Secondary | ICD-10-CM | POA: Diagnosis not present

## 2020-08-02 DIAGNOSIS — F411 Generalized anxiety disorder: Secondary | ICD-10-CM | POA: Diagnosis not present

## 2020-08-02 DIAGNOSIS — F339 Major depressive disorder, recurrent, unspecified: Secondary | ICD-10-CM | POA: Diagnosis not present

## 2020-08-02 DIAGNOSIS — K8689 Other specified diseases of pancreas: Secondary | ICD-10-CM | POA: Diagnosis not present

## 2020-08-02 DIAGNOSIS — M6281 Muscle weakness (generalized): Secondary | ICD-10-CM | POA: Diagnosis not present

## 2020-08-02 LAB — GLUCOSE, CAPILLARY: Glucose-Capillary: 260 mg/dL — ABNORMAL HIGH (ref 70–99)

## 2020-08-02 NOTE — TOC Transition Note (Signed)
Transition of Care [facility]) - CM/SW Discharge Note   Patient Details  Name: Travis CUERVO Sr. MRN: [id number] Date of Birth: 25-Jun-1950  Transition of Care [facility]) CM/SW Contact:  Vinie Sill, [place] Phone Number: 08/02/2020, 1:00 PM   Clinical Narrative:     Patient will Discharge to: [place] Discharge Date: 08/02/20 Family Notified: [name],Brother Transport By: Corey Harold  Per MD patient is ready for discharge. RN, patient, and facility notified of DC. Discharge Summary sent to facility. RN given number for [phone], Room 101. Ambulance transport requested for patient.   Clinical Social Worker signing off.  Thurmond Butts, MSW, LCSW Clinical Social Worker    Final next level of care: Skilled Nursing Facility Barriers to Discharge: Barriers Resolved   Patient Goals and CMS Choice Patient states their goals for this hospitalization and ongoing recovery are:: to be able to walk again      Discharge Placement              Patient chooses bed at: [facility] Patient to be transferred to facility by: [place] Name of family member notified: Delfino Lovett ,brother Patient and family notified of of transfer: 08/02/20  Discharge Plan and Services In-house Referral: Clinical Social Work                                   Social Determinants of Health (SDOH) Interventions     Readmission Risk Interventions No flowsheet data found.

## 2020-08-02 NOTE — Progress Notes (Signed)
     Subjective: 6 Days Post-Op Procedure(s) (LRB): RIGHT L1-2, LEFT L2-3, L3-4, L4-5 AND L5-S1 TRANSFORAMINAL LUMBAR INTERBODY FUSIONS, MPACT PEDICLE SCREWS AND RODS, CAGES L1-S1, LOCAL BONE GRAFT, ALLOGRAFT CANCELLOUS CHIPS, VIVIGEN (N/A) Awake, alert and oriented x 4. Pain is controlled with oral meds. Did not bath yesterday may bath today and dressing should be changed by nursing post shower.  Patient reports pain as moderate.    Objective:   VITALS:  Temp:  [98 F (36.7 C)-99.1 F (37.3 C)] 98.7 F (37.1 C) (03/14 0750) Pulse Rate:  [50-67] 67 (03/14 0750) Resp:  [14-20] 14 (03/14 0750) BP: (129-142)/(53-69) 142/59 (03/14 0750) SpO2:  [93 %-98 %] 98 % (03/14 0750)  Neurologically intact ABD soft Neurovascular intact Sensation intact distally Intact pulses distally Dorsiflexion/Plantar flexion intact Incision: scant drainage and Small area mid dressing with serosangineous drainage.   LABS Recent Labs    07/30/20 1457 07/31/20 1254  HGB 9.4* 10.7*  WBC 7.4 6.7  PLT 194 247   No results for input(s): NA, K, CL, CO2, BUN, CREATININE, GLUCOSE in the last 72 hours. No results for input(s): LABPT, INR in the last 72 hours.   Assessment/Plan: 6 Days Post-Op Procedure(s) (LRB): RIGHT L1-2, LEFT L2-3, L3-4, L4-5 AND L5-S1 TRANSFORAMINAL LUMBAR INTERBODY FUSIONS, MPACT PEDICLE SCREWS AND RODS, CAGES L1-S1, LOCAL BONE GRAFT, ALLOGRAFT CANCELLOUS CHIPS, VIVIGEN (N/A) Hgb is stable 10.1   Advance diet Up with therapy Discharge to SNF  [name] 08/02/2020, 7:55 AMPatient ID: Travis [name]., male   DOB: June 20, 1950, 70 y.o.   MRN: [id number]

## 2020-08-02 NOTE — Progress Notes (Signed)
Report called to [facility], and all questions answered. Pt to transport via [facility] with belongings.

## 2020-08-03 DIAGNOSIS — R5381 Other malaise: Secondary | ICD-10-CM | POA: Diagnosis not present

## 2020-08-03 DIAGNOSIS — M545 Low back pain, unspecified: Secondary | ICD-10-CM | POA: Diagnosis not present

## 2020-08-03 DIAGNOSIS — T24112A Burn of first degree of left thigh, initial encounter: Secondary | ICD-10-CM | POA: Diagnosis not present

## 2020-08-03 DIAGNOSIS — M6281 Muscle weakness (generalized): Secondary | ICD-10-CM | POA: Diagnosis not present

## 2020-08-03 DIAGNOSIS — T8131XA Disruption of external operation (surgical) wound, not elsewhere classified, initial encounter: Secondary | ICD-10-CM | POA: Diagnosis not present

## 2020-08-03 DIAGNOSIS — R262 Difficulty in walking, not elsewhere classified: Secondary | ICD-10-CM | POA: Diagnosis not present

## 2020-08-03 DIAGNOSIS — R2689 Other abnormalities of gait and mobility: Secondary | ICD-10-CM | POA: Diagnosis not present

## 2020-08-09 ENCOUNTER — Ambulatory Visit: Payer: Medicare HMO | Admitting: Skilled Nursing Facility1

## 2020-08-09 DIAGNOSIS — E291 Testicular hypofunction: Secondary | ICD-10-CM | POA: Diagnosis not present

## 2020-08-09 DIAGNOSIS — Z89511 Acquired absence of right leg below knee: Secondary | ICD-10-CM | POA: Diagnosis not present

## 2020-08-09 DIAGNOSIS — Z981 Arthrodesis status: Secondary | ICD-10-CM | POA: Diagnosis not present

## 2020-08-09 DIAGNOSIS — M545 Low back pain, unspecified: Secondary | ICD-10-CM | POA: Diagnosis not present

## 2020-08-09 DIAGNOSIS — G894 Chronic pain syndrome: Secondary | ICD-10-CM | POA: Diagnosis not present

## 2020-08-09 DIAGNOSIS — G4700 Insomnia, unspecified: Secondary | ICD-10-CM | POA: Diagnosis not present

## 2020-08-10 ENCOUNTER — Ambulatory Visit (INDEPENDENT_AMBULATORY_CARE_PROVIDER_SITE_OTHER): Payer: Medicare HMO | Admitting: Psychology

## 2020-08-10 ENCOUNTER — Telehealth (INDEPENDENT_AMBULATORY_CARE_PROVIDER_SITE_OTHER): Payer: Medicare HMO | Admitting: Psychiatry

## 2020-08-10 ENCOUNTER — Other Ambulatory Visit: Payer: Self-pay

## 2020-08-10 ENCOUNTER — Encounter (HOSPITAL_COMMUNITY): Payer: Self-pay | Admitting: Psychiatry

## 2020-08-10 VITALS — Wt 319.0 lb

## 2020-08-10 DIAGNOSIS — F331 Major depressive disorder, recurrent, moderate: Secondary | ICD-10-CM

## 2020-08-10 DIAGNOSIS — F419 Anxiety disorder, unspecified: Secondary | ICD-10-CM

## 2020-08-10 DIAGNOSIS — F319 Bipolar disorder, unspecified: Secondary | ICD-10-CM

## 2020-08-10 MED ORDER — DULOXETINE HCL 30 MG PO CPEP: 30.0000 mg | ORAL_CAPSULE | Freq: Every day | ORAL | 0 refills | Status: DC

## 2020-08-10 MED ORDER — ARIPIPRAZOLE 10 MG PO TABS
10.0000 mg | ORAL_TABLET | Freq: Every day | ORAL | 0 refills | Status: DC
Start: 2020-08-10 — End: 2020-11-10

## 2020-08-10 MED ORDER — TRAZODONE HCL 150 MG PO TABS: 150.0000 mg | ORAL_TABLET | Freq: Every day | ORAL | 0 refills | Status: DC

## 2020-08-10 MED ORDER — BENZTROPINE MESYLATE 0.5 MG PO TABS: 0.5000 mg | ORAL_TABLET | Freq: Every day | ORAL | 0 refills | Status: DC

## 2020-08-10 NOTE — Progress Notes (Signed)
Virtual Visit via Telephone Note  I connected with Travis Novak Sr. on 08/10/20 at  1:00 PM EDT by telephone and verified that I am speaking with the correct person using two identifiers.  Location: Patient: Nursing facility. Provider: Home office   I discussed the limitations, risks, security and privacy concerns of performing an evaluation and management service by telephone and the availability of in person appointments. I also discussed with the patient that there may be a patient responsible charge related to this service. The patient expressed understanding and agreed to proceed.   History of Present Illness: Patient is evaluated by phone session.  He recently had back surgery and currently he is in nursing facility getting rehab.  He is hoping to stay 2 weeks there.  He is also hoping that pain Subsided after the surgery.  He is taking pain medicine after the surgery.  Since we increase Abilify 10 mg he has been not irritable, upset or having mood swings.  He is not eating impulsively and lost 3 pounds since the last visit.  He is on a strict diet regimen.  He is hoping to walk and remain active after the surgery.  He has no tremors shakes or any EPS.  Denies any paranoia, hallucination.  His nephew helps him and he needs groceries.  Past Psychiatric History: Sawpsychiatrist in [place] and [place]. H/Omood swings,suicidal gesture,anger,hallucination, irritabilityandincrease spending.Married 5 times. H/Oinpatient in 2014 due to overdose on Xanaxandalcohol.DidIOP. TookEffexor, Xanax, Klonopin, Ambien,Lunesta, Wellbutrin,Ambien,Remeronand Prozac.Abilify started in ouroffice.   Psychiatric Specialty Exam: Physical Exam  Review of Systems  Weight (!) 319 lb (144.7 kg).There is no height or weight on file to calculate BMI.  General Appearance: NA  Eye Contact:  NA  Speech:  Slow  Volume:  Normal  Mood:  Euthymic  Affect:  NA  Thought Process:  Goal Directed   Orientation:  Full (Time, Place, and Person)  Thought Content:  WDL  Suicidal Thoughts:  No  Homicidal Thoughts:  No  Memory:  Immediate;   Good Recent;   Good Remote;   Fair  Judgement:  Intact  Insight:  Present  Psychomotor Activity:  NA  Concentration:  Concentration: Fair and Attention Span: Fair  Recall:  Good  Fund of Knowledge:  Good  Language:  Good  Akathisia:  No  Handed:  Right  AIMS (if indicated):     Assets:  Communication Skills Desire for Improvement Housing  ADL's:  Intact  Cognition:  WNL  Sleep:   good     Assessment and Plan: Bipolar disorder type I.  Anxiety.  I reviewed blood work results.  Patient doing better with increase Abilify.  He reported his mood is stable.  Continue Cymbalta 30 mg twice a day, Cogentin 0.5 mg at bedtime, Abilify 10 mg daily and trazodone 150 mg at bedtime. Recommended to call us back if he has any question or any concern.  Encouraged healthy lifestyle and watch his calorie intake.  Follow-up in 3 months.  Follow Up Instructions:    I discussed the assessment and treatment plan with the patient. The patient was provided an opportunity to ask questions and all were answered. The patient agreed with the plan and demonstrated an understanding of the instructions.   The patient was advised to call back or seek an in-person evaluation if the symptoms worsen or if the condition fails to improve as anticipated.  I provided 18 minutes of non-face-to-face time during this encounter.   Kathlee Nations, MD

## 2020-08-11 ENCOUNTER — Other Ambulatory Visit: Payer: Self-pay

## 2020-08-11 ENCOUNTER — Ambulatory Visit (INDEPENDENT_AMBULATORY_CARE_PROVIDER_SITE_OTHER): Payer: Medicare HMO

## 2020-08-11 ENCOUNTER — Encounter: Payer: Self-pay | Admitting: Specialist

## 2020-08-11 ENCOUNTER — Ambulatory Visit (INDEPENDENT_AMBULATORY_CARE_PROVIDER_SITE_OTHER): Payer: Medicare HMO | Admitting: Specialist

## 2020-08-11 VITALS — BP 141/69 | HR 61 | Ht 73.0 in | Wt 319.0 lb

## 2020-08-11 DIAGNOSIS — Z981 Arthrodesis status: Secondary | ICD-10-CM | POA: Diagnosis not present

## 2020-08-11 DIAGNOSIS — M533 Sacrococcygeal disorders, not elsewhere classified: Secondary | ICD-10-CM

## 2020-08-11 NOTE — Patient Instructions (Addendum)
Call if there is increasing drainage, fever greater than 101.5, severe head aches, and worsening nausea or light sensitivity. If shortness of breath, bloody cough or chest tightness or pain go to an emergency room. No lifting greater than 10 lbs. Avoid bending, stooping and twisting. Use brace when sitting and out of bed even to go to bathroom. Walk in house for first 2 weeks then may start to get out slowly increasing distances up to one mile by 4-6 weeks post op. May shower and change dressing following bathing with shower.When bathing remove the brace shower and replace brace before getting out of the shower. If the brace irritates the incision area then apply soft dressing, wear clothing between skin and brace. Right SI joint injection is ordered due to right pelvis and SI joint pain with movement of hips and with standing and walking.  Generally would not expect hip flexion to increase pain in buttock if pain were due to nerve compression. This is concerning for mechanical pain at the junction of spine and pelvis or SI joint Will proceed with SI block and if no relief a bone scan to assess for SI arthrosis pain vs stress reaction.

## 2020-08-11 NOTE — Progress Notes (Addendum)
Post-Op Visit Note   Patient: Travis EGGER Sr.           Date of Birth: 20-Feb-1951           MRN: [id number] Visit Date: 08/11/2020 PCP: [name], MD   Assessment & Plan: 2 weeks post op L1 to S1 fusion.  Chief Complaint:  Chief Complaint  Patient presents with  . Lower Back - Routine Post Op   Visit Diagnoses:  1. S/P lumbar spinal fusion   2. Sacroiliac joint disease   Leg strength is normal. There is pain with hip flexion on the left and on the right. Motor is normal  Right residual BK Sitting is painful Left leg is not painful post op and he had left L2-3, L3-4, L4-5 and L5-S1 foramenal decompressions, right side at the L1-2 level.  Plan: Call if there is increasing drainage, fever greater than 101.5, severe head aches, and worsening nausea or light sensitivity. If shortness of breath, bloody cough or chest tightness or pain go to an emergency room. No lifting greater than 10 lbs. Avoid bending, stooping and twisting. Use brace when sitting and out of bed even to go to bathroom. Walk in house for first 2 weeks then may start to get out slowly increasing distances up to one mile by 4-6 weeks post op. May shower and change dressing following bathing with shower.When bathing remove the brace shower and replace brace before getting out of the shower. If the brace irritates the incision area then apply soft dressing, wear clothing between skin and brace. Right SI joint injection is ordered due to right pelvis and SI joint pain with movement of hips and with standing and walking.  Generally would not expect hip flexion to increase pain in buttock if pain were due to nerve compression. This is concerning for mechanical pain at the junction of spine and pelvis or SI joint Will proceed with SI block and if no relief a bone scan to assess for SI arthrosis pain vs stress reaction. Follow-Up Instructions: Return in about 3 weeks (around 09/01/2020).   Orders:  Orders Placed  This Encounter  Procedures  . XR Lumbar Spine 2-3 Views  . Ambulatory referral to Physical Medicine Rehab   No orders of the defined types were placed in this encounter.   Imaging: XR Lumbar Spine 2-3 Views  Result Date: 08/11/2020 AP and lateral lumbar radiographs show fixation of the lumbar spine with pedicle screws and rod from L1 to S1 there is bilateral SI joint sclerosis, bilateral Total hip replacements. Spondylosis and severe DDD each lumbar segment no sign of acute changes.    PMFS History: Patient Active Problem List   Diagnosis Date Noted  . Other spondylosis with radiculopathy, lumbar region 07/16/2018    Priority: High    Class: Chronic  . Spinal stenosis in cervical region 09/26/2013    Priority: High    Class: Chronic  . Spinal stenosis of lumbar region 09/26/2013    Priority: High    Class: Chronic  . Status post lumbar spinal fusion 07/27/2020  . Nasal sinus polyp 06/16/2020  . Vitamin D deficiency 02/24/2020  . Cough 07/29/2019  . Wheezing 07/29/2019  . Possible exposure to STD 07/24/2019  . Unilateral primary osteoarthritis, left hip 06/06/2019  . Status post total replacement of left hip 06/06/2019  . S/P laparoscopic sleeve gastrectomy 03/03/2019  . Rash 01/24/2019  . Fever 08/15/2018  . UTI (urinary tract infection) 08/15/2018  . Fall at  home, initial encounter 08/15/2018  . Chronic anemia 08/15/2018  . Hypokalemia 08/15/2018  . Bowel incontinence 07/25/2018  . Status post lumbar laminectomy 07/16/2018  . Status post THR (total hip replacement) 04/17/2018  . Unilateral primary osteoarthritis, right hip   . Morbid obesity with BMI of 40.0-44.9, adult ([place]) 02/28/2018  . Myocardial infarction ([place]) 02/25/2018  . Coronary artery disease involving native coronary artery of native heart 02/25/2018  . PAD (peripheral artery disease) ([place]) 02/25/2018  . S/P BKA (below knee amputation) unilateral, right ([place]) 02/25/2018  . Sacroiliitis ([place]) 02/25/2018   . SOB (shortness of breath) 01/03/2018  . Acute on chronic heart failure ([place]) 01/03/2018  . Severe right groin pain 12/20/2017  . Morbid obesity ([place]) 10/09/2017  . Low back pain 07/13/2017  . Leukoplakia, tongue 01/26/2017  . Skin lesion 10/20/2016  . S/P unilateral BKA (below knee amputation), right ([place]) 06/14/2016  . Charcot foot due to diabetes mellitus ([place])   . Charcot's arthropathy associated with type 2 diabetes mellitus ([place]) 04/11/2016  . Preventative health care 11/05/2015  . Major depression 09/13/2015  . S/P TKR (total knee replacement) bilaterally 09/13/2015  . GERD (gastroesophageal reflux disease) 09/08/2015  . S/P laparoscopic hernia repair 05/11/2015  . PPD positive 04/08/2015  . Benign neoplasm of descending colon   . Benign neoplasm of cecum   . Acute blood loss as cause of postoperative anemia   . Chronic anticoagulation   . Occult blood positive stool 10/17/2014  . General weakness 07/14/2014  . Urinary incontinence 07/14/2014  . Headache(784.0) 10/15/2013  . Hand joint pain 06/10/2013  . Rotator cuff tear arthropathy of both shoulders 06/10/2013  . Skin lesion of cheek 05/01/2013  . Pain of right thumb 04/03/2013  . Balance disorder 03/12/2013  . Gait disorder 03/12/2013  . Tremor 03/12/2013  . Left hip pain 03/12/2013  . Pre-ulcerative corn or callous 02/06/2013  . Anxiety 11/12/2011  . OSA on CPAP 11/07/2011  . Bradycardia 10/20/2011  . Insomnia 10/04/2011  . Obesity 01/12/2011  . Diabetes mellitus type 2 in obese ([place]) 09/27/2010  . ERECTILE DYSFUNCTION, ORGANIC 05/30/2010  . [place] DISEASE, LUMBAR 04/19/2010  . SCIATICA, LEFT 04/19/2010  . Chronic pain syndrome 10/27/2009  . Hyperlipidemia 07/15/2009  . Essential hypertension 06/24/2009  . Coronary artery disease involving native coronary artery of native heart without angina pectoris 06/24/2009  . Allergic rhinitis 06/24/2009  . URETHRAL STRICTURE 06/24/2009  . DEGENERATIVE JOINT DISEASE  06/24/2009  . SHOULDER PAIN, BILATERAL 06/24/2009  . FATIGUE 06/24/2009  . NEPHROLITHIASIS, HX OF 06/24/2009   Past Medical History:  Diagnosis Date  . ALLERGIC RHINITIS 06/24/2009  . Allergy   . Anemia    IDA  . Anxiety 11/12/2011   Adequate for discharge   . Arthritis    "all my joints" (09/30/2013)  . Arthritis of foot, right, degenerative 04/15/2014  . Balance disorder 03/12/2013  . Benign neoplasm of cecum   . Benign neoplasm of descending colon   . CAD (coronary artery disease) 06/24/2009   5 stents placed in 2007    . Chronic anticoagulation   . Chronic pain syndrome 10/27/2009   of ankle, shoulders, low back.  sciatica.   . Closed fracture of right foot 10/17/2014  . CORONARY ARTERY DISEASE 06/24/2009   a. s/p multiple PCIs - In 2008 he had a Taxus DES to the mild LAD, Endeavor DES to mid LCX and distal LCX. In January 2009 he had DES to distal LCX, mid LCX and proximal LCX. In  November 2009 had BMS x 2 to the mid RCA. Cath 10/2011 with patent stents, noncardiac CP. LHC 01/2013: patent stents (noncardiac CP).  . DEGENERATIVE JOINT DISEASE 06/24/2009   Qualifier: Diagnosis of  By: [name], [name]   . Depression   . Depression with anxiety    Prior suicide attempt(08/25/19-pt states not suicide attempt)  . [place] DISEASE, LUMBAR 04/19/2010  . ERECTILE DYSFUNCTION, ORGANIC 05/30/2010  . Essential hypertension 06/24/2009   Qualifier: Diagnosis of  By: [name], [name]   . Fibromyalgia   . Fracture dislocation of ankle joint 09/02/2015  . Gait disorder 03/12/2013  . General weakness 07/14/2014  . GERD (gastroesophageal reflux disease) 09/08/2015   08/25/15-pt states was cardiac origin, not GERD  . Hand joint pain 06/10/2013  . Heart murmur   . Hepatitis C    treated pt. unknown with what he was a teenager  . History of kidney stones   . Hyperlipidemia 07/15/2009   Qualifier: Diagnosis of  By: Aundra Dubin, MD, [place]    . HYPERLIPIDEMIA-MIXED 07/15/2009   08/25/19-pt state cholesterol was normal range   . HYPERTENSION 06/24/2009  . Insomnia 10/04/2011  . Irregular heart beat   . Left hip pain 03/12/2013   Injected under ultrasound guidance on June 24, 2013   . Major depression 09/13/2015  . Myocardial infarction ([place]) 07-23-2006  . Non-cardiac chest pain 10/2011, 01/2013  . Obesity   . Occult blood positive stool 10/17/2014  . Open ankle fracture 09/02/2015  . OSA (obstructive sleep apnea)    not using CPAP (09/30/2013)  . Pain of right thumb 04/03/2013  . Pneumonia   . PPD positive 04/08/2015  . Pre-ulcerative corn or callous 02/06/2013  . Rotator cuff tear arthropathy of both shoulders 06/10/2013   History of bilateral shoulder cuff surgery for rotator cuff tears. Reports increase in pain 09/11/2015 during physical therapy of the left shoulder.   [name] SCIATICA, LEFT 04/19/2010   Qualifier: Diagnosis of  By: [name], [name]   . Sleep apnea    wears cpap 08/25/19-States not using due to nasal stuffiness.  [name] Spinal stenosis in cervical region 09/26/2013  . Spinal stenosis, lumbar region, with neurogenic claudication 09/26/2013  . Type II diabetes mellitus ([place]) 07-23-2010   no meds in 09/2014.   [name] Umbilical hernia   . Uncontrolled type 2 DM with peripheral circulatory disorder ([place]) 10/04/2013   08/25/19- pt states A1C normal, no diabetes  . URETHRAL STRICTURE 06/24/2009   self catheterizes.     Family History  Problem Relation Age of Onset  . Depression Mother   . Heart disease Mother   . Hypertension Mother   . Breast cancer Mother        primary cancer  . Stomach cancer Mother   . Esophageal cancer Mother   . Pancreatic cancer Mother   . Diabetes Father   . Heart disease Father        CABG  . Hypertension Father   . Hyperlipidemia Father   . Prostate cancer Father   . Skin cancer Father   . Depression Brother        x 2  . Hypertension Brother        x2  . Colon polyps Brother   . Heart attack Son 91  . Early death Son   . Heart disease Maternal Grandfather   . Early death Maternal  Grandfather   . Heart attack Maternal Grandfather 07-24-63  . Early death Paternal Grandfather   .  Coronary artery disease Other   . Hypertension Other   . Depression Other   . Healthy Son   . Colon cancer Neg Hx   . Rectal cancer Neg Hx     Past Surgical History:  Procedure Laterality Date  . AMPUTATION Right 06/14/2016   Procedure: AMPUTATION BELOW KNEE;  Surgeon: Newt Minion, MD;  Location: [facility];  Service: Orthopedics;  Laterality: Right;  . ANKLE FUSION Right 04/15/2014   Procedure: Right Subtalar, Talonavicular Fusion;  Surgeon: Newt Minion, MD;  Location: [facility];  Service: Orthopedics;  Laterality: Right;  . ANKLE FUSION Right 04/18/2016   Procedure: Right Ankle Tibiocalcaneal Fusion;  Surgeon: Newt Minion, MD;  Location: [facility];  Service: Orthopedics;  Laterality: Right;  . ANTERIOR CERVICAL DECOMP/DISCECTOMY FUSION N/A 09/26/2013   Procedure: ANTERIOR CERVICAL DISCECTOMY FUSION C3-4, plate and screw fixation, allograft bone graft;  Surgeon: Jessy Oto, MD;  Location: [facility];  Service: Orthopedics;  Laterality: N/A;  . BACK SURGERY     3  . BELOW KNEE LEG AMPUTATION Right 06/14/2016   right ankle and foot  . CARDIAC CATHETERIZATION  X 1  . CARPAL TUNNEL RELEASE Bilateral   . COLONOSCOPY N/A 10/22/2014   Procedure: COLONOSCOPY;  Surgeon: Lafayette Dragon, MD;  Location: [facility] ENDOSCOPY;  Service: Endoscopy;  Laterality: N/A;  . COLONOSCOPY  11/19/2018  . CORONARY ANGIOPLASTY WITH STENT PLACEMENT     "I have 9 stents"  . ESOPHAGOGASTRODUODENOSCOPY N/A 10/19/2014   Procedure: ESOPHAGOGASTRODUODENOSCOPY (EGD);  Surgeon: Jerene Bears, MD;  Location: [facility] ENDOSCOPY;  Service: Endoscopy;  Laterality: N/A;  . FRACTURE SURGERY    . FUSION OF TALONAVICULAR JOINT Right 04/15/2014   dr duda  . GASTRIC BYPASS  02/20/2019  . HERNIA REPAIR     umbilical  . INGUINAL HERNIA REPAIR Right 05/11/2015   Procedure: LAPAROSCOPIC REPAIR RIGHT  INGUINAL HERNIA;  Surgeon: Greer Pickerel, MD;  Location: [place];   Service: General;  Laterality: Right;  . INSERTION OF MESH Right 05/11/2015   Procedure: INSERTION OF MESH;  Surgeon: Greer Pickerel, MD;  Location: [facility];  Service: General;  Laterality: Right;  . JOINT REPLACEMENT    . KNEE CARTILAGE SURGERY Right X 12   "~ 1/2 open; ~ 1/2 scopes"  . KNEE CARTILAGE SURGERY Left X 3   "3 scopes"  . LAPAROSCOPIC GASTRIC SLEEVE RESECTION N/A 03/03/2019   Procedure: LAPAROSCOPIC GASTRIC SLEEVE RESECTION, Upper Endo, ERAS Pathway;  Surgeon: Greer Pickerel, MD;  Location: [facility];  Service: General;  Laterality: N/A;  . LEFT HEART CATHETERIZATION WITH CORONARY ANGIOGRAM N/A 02/10/2013   Procedure: LEFT HEART CATHETERIZATION WITH CORONARY ANGIOGRAM;  Surgeon: Burnell Blanks, MD;  Location: [facility] CATH LAB;  Service: Cardiovascular;  Laterality: N/A;  . LUMBAR LAMINECTOMY N/A 07/16/2018   Procedure: LEFT L4-5 REDO PARTIAL LUMBAR HEMILAMINECTOMY WITH FORAMINOTOMY LEFT L4;  Surgeon: Jessy Oto, MD;  Location: [place];  Service: Orthopedics;  Laterality: N/A;  . LUMBAR LAMINECTOMY/DECOMPRESSION MICRODISCECTOMY N/A 01/27/2014   Procedure: CENTRAL LUMBAR LAMINECTOMY L4-5 AND L3-4;  Surgeon: Jessy Oto, MD;  Location: [place];  Service: Orthopedics;  Laterality: N/A;  . ORIF ANKLE FRACTURE Right 09/02/2015   Procedure: OPEN REDUCTION INTERNAL FIXATION (ORIF) ANKLE FRACTURE;  Surgeon: Newt Minion, MD;  Location: [place];  Service: Orthopedics;  Laterality: Right;  . PERIPHERALLY INSERTED CENTRAL CATHETER INSERTION  09/02/2015  . POLYPECTOMY    . ROTATOR CUFF REPAIR Left    x 2  .  ROTATOR CUFF REPAIR Right    x 3  . SHOULDER ARTHROSCOPY W/ ROTATOR CUFF REPAIR Bilateral    "3 on the right; 1 on the left"  . SKIN SPLIT GRAFT Right 10/01/2015   Procedure: RIGHT ANKLE APPLY SKIN GRAFT SPLIT THICKNESS;  Surgeon: Newt Minion, MD;  Location: [facility];  Service: Orthopedics;  Laterality: Right;  . TONSILLECTOMY    . TOOTH EXTRACTION    . TOTAL HIP ARTHROPLASTY Right 04/17/2018  .  TOTAL HIP ARTHROPLASTY Right 04/17/2018   Procedure: RIGHT TOTAL HIP ARTHROPLASTY;  Surgeon: Newt Minion, MD;  Location: [place];  Service: Orthopedics;  Laterality: Right;  . TOTAL HIP ARTHROPLASTY Left 06/06/2019   Procedure: LEFT TOTAL HIP ARTHROPLASTY ANTERIOR APPROACH;  Surgeon: Mcarthur Rossetti, MD;  Location: [place];  Service: Orthopedics;  Laterality: Left;  . TOTAL HIP REVISION Left 05/24/2019  . TOTAL KNEE ARTHROPLASTY Bilateral 2008  . UMBILICAL HERNIA REPAIR     UHR  . UPPER GASTROINTESTINAL ENDOSCOPY  2016  . URETHRAL DILATION  X 4  . VASECTOMY    . WISDOM TOOTH EXTRACTION     Social History   Occupational History  . Occupation: disabled since 2006 due to ortho. heart, psych    Employer: UNEMPLOYED  . Occupation: part time work as an Multimedia programmer, wrestling, and Holiday representative  Tobacco Use  . Smoking status: Former Smoker    Types: Cigars    Quit date: 08/28/2010    Years since quitting: 9.9  . Smokeless tobacco: Never Used  . Tobacco comment: 04/18/2016 "smoked 1 cigar/wk when I did smoke"  Vaping Use  . Vaping Use: Never used  Substance and Sexual Activity  . Alcohol use: Yes    Alcohol/week: 0.0 standard drinks    Comment: occasionally  . Drug use: No  . Sexual activity: Not Currently

## 2020-08-12 DIAGNOSIS — G894 Chronic pain syndrome: Secondary | ICD-10-CM | POA: Diagnosis not present

## 2020-08-12 DIAGNOSIS — Z981 Arthrodesis status: Secondary | ICD-10-CM | POA: Diagnosis not present

## 2020-08-12 DIAGNOSIS — E119 Type 2 diabetes mellitus without complications: Secondary | ICD-10-CM | POA: Diagnosis not present

## 2020-08-12 DIAGNOSIS — M48061 Spinal stenosis, lumbar region without neurogenic claudication: Secondary | ICD-10-CM | POA: Diagnosis not present

## 2020-08-13 DIAGNOSIS — R2689 Other abnormalities of gait and mobility: Secondary | ICD-10-CM | POA: Diagnosis not present

## 2020-08-13 DIAGNOSIS — T8131XA Disruption of external operation (surgical) wound, not elsewhere classified, initial encounter: Secondary | ICD-10-CM | POA: Diagnosis not present

## 2020-08-13 DIAGNOSIS — T24112A Burn of first degree of left thigh, initial encounter: Secondary | ICD-10-CM | POA: Diagnosis not present

## 2020-08-17 DIAGNOSIS — M545 Low back pain, unspecified: Secondary | ICD-10-CM | POA: Diagnosis not present

## 2020-08-17 DIAGNOSIS — R5381 Other malaise: Secondary | ICD-10-CM | POA: Diagnosis not present

## 2020-08-17 DIAGNOSIS — R262 Difficulty in walking, not elsewhere classified: Secondary | ICD-10-CM | POA: Diagnosis not present

## 2020-08-17 DIAGNOSIS — M6281 Muscle weakness (generalized): Secondary | ICD-10-CM | POA: Diagnosis not present

## 2020-08-23 ENCOUNTER — Telehealth: Payer: Self-pay | Admitting: Physical Medicine and Rehabilitation

## 2020-08-23 ENCOUNTER — Ambulatory Visit: Payer: Medicare HMO | Admitting: Physical Medicine and Rehabilitation

## 2020-08-23 DIAGNOSIS — M545 Low back pain, unspecified: Secondary | ICD-10-CM | POA: Diagnosis not present

## 2020-08-23 DIAGNOSIS — K8689 Other specified diseases of pancreas: Secondary | ICD-10-CM | POA: Diagnosis not present

## 2020-08-23 DIAGNOSIS — G894 Chronic pain syndrome: Secondary | ICD-10-CM | POA: Diagnosis not present

## 2020-08-23 DIAGNOSIS — Z981 Arthrodesis status: Secondary | ICD-10-CM | POA: Diagnosis not present

## 2020-08-23 NOTE — Telephone Encounter (Signed)
ERROR

## 2020-08-24 ENCOUNTER — Telehealth: Payer: Self-pay | Admitting: Physical Medicine and Rehabilitation

## 2020-08-24 NOTE — Telephone Encounter (Signed)
Pt called stating he would like to get a appt with Dr. Ernestina Patches for a back inj.   [phone]

## 2020-08-24 NOTE — Telephone Encounter (Signed)
Rescheduled

## 2020-08-26 DIAGNOSIS — M48061 Spinal stenosis, lumbar region without neurogenic claudication: Secondary | ICD-10-CM | POA: Diagnosis not present

## 2020-08-26 DIAGNOSIS — M4726 Other spondylosis with radiculopathy, lumbar region: Secondary | ICD-10-CM | POA: Diagnosis not present

## 2020-08-26 DIAGNOSIS — M4326 Fusion of spine, lumbar region: Secondary | ICD-10-CM | POA: Diagnosis not present

## 2020-08-27 ENCOUNTER — Telehealth: Payer: Self-pay | Admitting: Internal Medicine

## 2020-08-27 DIAGNOSIS — R6889 Other general symptoms and signs: Secondary | ICD-10-CM | POA: Diagnosis not present

## 2020-08-27 NOTE — Telephone Encounter (Signed)
Ok for verbals if needed, and will sign other orders

## 2020-08-27 NOTE — Telephone Encounter (Signed)
Estill Bamberg w/ [facility] called and said that they had received a referral from [facility] for the patient and was wondering if Dr. Jenny Reichmann would be willing to sign the home health orders. Please advise     Okay to LVM: [phone]

## 2020-08-29 DIAGNOSIS — M48062 Spinal stenosis, lumbar region with neurogenic claudication: Secondary | ICD-10-CM | POA: Diagnosis not present

## 2020-08-29 DIAGNOSIS — M4802 Spinal stenosis, cervical region: Secondary | ICD-10-CM | POA: Diagnosis not present

## 2020-08-29 DIAGNOSIS — I110 Hypertensive heart disease with heart failure: Secondary | ICD-10-CM | POA: Diagnosis not present

## 2020-08-29 DIAGNOSIS — I2510 Atherosclerotic heart disease of native coronary artery without angina pectoris: Secondary | ICD-10-CM | POA: Diagnosis not present

## 2020-08-29 DIAGNOSIS — I252 Old myocardial infarction: Secondary | ICD-10-CM | POA: Diagnosis not present

## 2020-08-29 DIAGNOSIS — I499 Cardiac arrhythmia, unspecified: Secondary | ICD-10-CM | POA: Diagnosis not present

## 2020-08-29 DIAGNOSIS — Z4789 Encounter for other orthopedic aftercare: Secondary | ICD-10-CM | POA: Diagnosis not present

## 2020-08-29 DIAGNOSIS — I509 Heart failure, unspecified: Secondary | ICD-10-CM | POA: Diagnosis not present

## 2020-08-29 DIAGNOSIS — M797 Fibromyalgia: Secondary | ICD-10-CM | POA: Diagnosis not present

## 2020-08-30 ENCOUNTER — Other Ambulatory Visit (INDEPENDENT_AMBULATORY_CARE_PROVIDER_SITE_OTHER): Payer: Self-pay | Admitting: Specialist

## 2020-08-30 ENCOUNTER — Telehealth: Payer: Self-pay | Admitting: Internal Medicine

## 2020-08-30 DIAGNOSIS — N3941 Urge incontinence: Secondary | ICD-10-CM | POA: Diagnosis not present

## 2020-08-30 DIAGNOSIS — R6889 Other general symptoms and signs: Secondary | ICD-10-CM | POA: Diagnosis not present

## 2020-08-30 DIAGNOSIS — N401 Enlarged prostate with lower urinary tract symptoms: Secondary | ICD-10-CM | POA: Diagnosis not present

## 2020-08-30 DIAGNOSIS — R3915 Urgency of urination: Secondary | ICD-10-CM | POA: Diagnosis not present

## 2020-08-30 NOTE — Telephone Encounter (Signed)
    [place] Name: [facility] Agency Name: [facility] Phone #: [phone] Service Requested: NURSING Frequency of Visits:  2W1, 1W8, 2 PRN   FAX [phone]

## 2020-08-30 NOTE — Telephone Encounter (Signed)
Travis [name] notified that Dr. Jenny Reichmann would sign and verbals would also be given

## 2020-08-31 ENCOUNTER — Ambulatory Visit: Payer: Medicare HMO | Admitting: Physical Medicine and Rehabilitation

## 2020-09-02 ENCOUNTER — Telehealth: Payer: Self-pay | Admitting: Internal Medicine

## 2020-09-02 ENCOUNTER — Ambulatory Visit: Payer: Medicare HMO | Admitting: Specialist

## 2020-09-02 DIAGNOSIS — I509 Heart failure, unspecified: Secondary | ICD-10-CM | POA: Diagnosis not present

## 2020-09-02 DIAGNOSIS — M4802 Spinal stenosis, cervical region: Secondary | ICD-10-CM | POA: Diagnosis not present

## 2020-09-02 DIAGNOSIS — I252 Old myocardial infarction: Secondary | ICD-10-CM | POA: Diagnosis not present

## 2020-09-02 DIAGNOSIS — I2510 Atherosclerotic heart disease of native coronary artery without angina pectoris: Secondary | ICD-10-CM | POA: Diagnosis not present

## 2020-09-02 DIAGNOSIS — M48062 Spinal stenosis, lumbar region with neurogenic claudication: Secondary | ICD-10-CM | POA: Diagnosis not present

## 2020-09-02 DIAGNOSIS — I110 Hypertensive heart disease with heart failure: Secondary | ICD-10-CM | POA: Diagnosis not present

## 2020-09-02 DIAGNOSIS — I499 Cardiac arrhythmia, unspecified: Secondary | ICD-10-CM | POA: Diagnosis not present

## 2020-09-02 DIAGNOSIS — M797 Fibromyalgia: Secondary | ICD-10-CM | POA: Diagnosis not present

## 2020-09-02 DIAGNOSIS — Z4789 Encounter for other orthopedic aftercare: Secondary | ICD-10-CM | POA: Diagnosis not present

## 2020-09-02 NOTE — Telephone Encounter (Signed)
Ok for verbals 

## 2020-09-02 NOTE — Telephone Encounter (Signed)
Malorie w/ [facility] is requesting OT verbals for 1w4.   Okay to LVM: [phone]

## 2020-09-06 DIAGNOSIS — G894 Chronic pain syndrome: Secondary | ICD-10-CM | POA: Diagnosis not present

## 2020-09-06 DIAGNOSIS — M47816 Spondylosis without myelopathy or radiculopathy, lumbar region: Secondary | ICD-10-CM | POA: Diagnosis not present

## 2020-09-06 DIAGNOSIS — M961 Postlaminectomy syndrome, not elsewhere classified: Secondary | ICD-10-CM | POA: Diagnosis not present

## 2020-09-06 DIAGNOSIS — R6889 Other general symptoms and signs: Secondary | ICD-10-CM | POA: Diagnosis not present

## 2020-09-06 NOTE — Telephone Encounter (Signed)
Orders given to [facility]

## 2020-09-07 ENCOUNTER — Other Ambulatory Visit: Payer: Self-pay | Admitting: Internal Medicine

## 2020-09-07 DIAGNOSIS — Z4789 Encounter for other orthopedic aftercare: Secondary | ICD-10-CM | POA: Diagnosis not present

## 2020-09-07 DIAGNOSIS — M4802 Spinal stenosis, cervical region: Secondary | ICD-10-CM | POA: Diagnosis not present

## 2020-09-07 DIAGNOSIS — M797 Fibromyalgia: Secondary | ICD-10-CM | POA: Diagnosis not present

## 2020-09-07 DIAGNOSIS — I110 Hypertensive heart disease with heart failure: Secondary | ICD-10-CM | POA: Diagnosis not present

## 2020-09-07 DIAGNOSIS — I2510 Atherosclerotic heart disease of native coronary artery without angina pectoris: Secondary | ICD-10-CM | POA: Diagnosis not present

## 2020-09-07 DIAGNOSIS — I252 Old myocardial infarction: Secondary | ICD-10-CM | POA: Diagnosis not present

## 2020-09-07 DIAGNOSIS — M48062 Spinal stenosis, lumbar region with neurogenic claudication: Secondary | ICD-10-CM | POA: Diagnosis not present

## 2020-09-07 DIAGNOSIS — I509 Heart failure, unspecified: Secondary | ICD-10-CM | POA: Diagnosis not present

## 2020-09-07 DIAGNOSIS — I499 Cardiac arrhythmia, unspecified: Secondary | ICD-10-CM | POA: Diagnosis not present

## 2020-09-10 ENCOUNTER — Telehealth: Payer: Self-pay

## 2020-09-10 DIAGNOSIS — M4802 Spinal stenosis, cervical region: Secondary | ICD-10-CM | POA: Diagnosis not present

## 2020-09-10 DIAGNOSIS — I110 Hypertensive heart disease with heart failure: Secondary | ICD-10-CM | POA: Diagnosis not present

## 2020-09-10 DIAGNOSIS — I499 Cardiac arrhythmia, unspecified: Secondary | ICD-10-CM | POA: Diagnosis not present

## 2020-09-10 DIAGNOSIS — I509 Heart failure, unspecified: Secondary | ICD-10-CM | POA: Diagnosis not present

## 2020-09-10 DIAGNOSIS — Z4789 Encounter for other orthopedic aftercare: Secondary | ICD-10-CM | POA: Diagnosis not present

## 2020-09-10 DIAGNOSIS — M48062 Spinal stenosis, lumbar region with neurogenic claudication: Secondary | ICD-10-CM | POA: Diagnosis not present

## 2020-09-10 DIAGNOSIS — M797 Fibromyalgia: Secondary | ICD-10-CM | POA: Diagnosis not present

## 2020-09-10 DIAGNOSIS — I2510 Atherosclerotic heart disease of native coronary artery without angina pectoris: Secondary | ICD-10-CM | POA: Diagnosis not present

## 2020-09-10 DIAGNOSIS — I252 Old myocardial infarction: Secondary | ICD-10-CM | POA: Diagnosis not present

## 2020-09-10 NOTE — Telephone Encounter (Signed)
Patient called he missed his appointment 4/14 he is requesting to be worked into Dr.Nitkas schedule, patient had surgery 3/23 call [phone]

## 2020-09-11 DIAGNOSIS — M4316 Spondylolisthesis, lumbar region: Secondary | ICD-10-CM

## 2020-09-13 ENCOUNTER — Ambulatory Visit: Payer: Medicare HMO | Admitting: Registered"

## 2020-09-14 ENCOUNTER — Ambulatory Visit: Payer: Medicare HMO | Admitting: Internal Medicine

## 2020-09-14 DIAGNOSIS — I2510 Atherosclerotic heart disease of native coronary artery without angina pectoris: Secondary | ICD-10-CM | POA: Diagnosis not present

## 2020-09-14 DIAGNOSIS — I509 Heart failure, unspecified: Secondary | ICD-10-CM | POA: Diagnosis not present

## 2020-09-14 DIAGNOSIS — Z4789 Encounter for other orthopedic aftercare: Secondary | ICD-10-CM | POA: Diagnosis not present

## 2020-09-14 DIAGNOSIS — I499 Cardiac arrhythmia, unspecified: Secondary | ICD-10-CM | POA: Diagnosis not present

## 2020-09-14 DIAGNOSIS — I252 Old myocardial infarction: Secondary | ICD-10-CM | POA: Diagnosis not present

## 2020-09-14 DIAGNOSIS — M48062 Spinal stenosis, lumbar region with neurogenic claudication: Secondary | ICD-10-CM | POA: Diagnosis not present

## 2020-09-14 DIAGNOSIS — M4802 Spinal stenosis, cervical region: Secondary | ICD-10-CM | POA: Diagnosis not present

## 2020-09-14 DIAGNOSIS — I110 Hypertensive heart disease with heart failure: Secondary | ICD-10-CM | POA: Diagnosis not present

## 2020-09-14 DIAGNOSIS — M797 Fibromyalgia: Secondary | ICD-10-CM | POA: Diagnosis not present

## 2020-09-15 ENCOUNTER — Telehealth: Payer: Self-pay | Admitting: Specialist

## 2020-09-15 NOTE — Telephone Encounter (Signed)
Hey Autumn, please see below- would this be something that Dr. Sharol Given can do for him?

## 2020-09-15 NOTE — Telephone Encounter (Signed)
Patient called requesting a script for liner for prostatic leg. Please send to [facility]. Patient phone number is [phone].

## 2020-09-16 ENCOUNTER — Ambulatory Visit (INDEPENDENT_AMBULATORY_CARE_PROVIDER_SITE_OTHER): Payer: Medicare HMO | Admitting: Surgery

## 2020-09-16 ENCOUNTER — Ambulatory Visit: Payer: Medicare HMO | Admitting: Surgery

## 2020-09-16 ENCOUNTER — Encounter: Payer: Self-pay | Admitting: Surgery

## 2020-09-16 ENCOUNTER — Encounter: Payer: Medicare HMO | Admitting: Surgery

## 2020-09-16 VITALS — BP 109/59 | HR 61 | Ht 73.0 in | Wt 319.0 lb

## 2020-09-16 DIAGNOSIS — M4802 Spinal stenosis, cervical region: Secondary | ICD-10-CM | POA: Diagnosis not present

## 2020-09-16 DIAGNOSIS — Z981 Arthrodesis status: Secondary | ICD-10-CM

## 2020-09-16 DIAGNOSIS — I509 Heart failure, unspecified: Secondary | ICD-10-CM | POA: Diagnosis not present

## 2020-09-16 DIAGNOSIS — M797 Fibromyalgia: Secondary | ICD-10-CM | POA: Diagnosis not present

## 2020-09-16 DIAGNOSIS — I110 Hypertensive heart disease with heart failure: Secondary | ICD-10-CM | POA: Diagnosis not present

## 2020-09-16 DIAGNOSIS — I2510 Atherosclerotic heart disease of native coronary artery without angina pectoris: Secondary | ICD-10-CM | POA: Diagnosis not present

## 2020-09-16 DIAGNOSIS — I252 Old myocardial infarction: Secondary | ICD-10-CM | POA: Diagnosis not present

## 2020-09-16 DIAGNOSIS — I499 Cardiac arrhythmia, unspecified: Secondary | ICD-10-CM | POA: Diagnosis not present

## 2020-09-16 DIAGNOSIS — I700 Atherosclerosis of aorta: Secondary | ICD-10-CM | POA: Insufficient documentation

## 2020-09-16 DIAGNOSIS — M48062 Spinal stenosis, lumbar region with neurogenic claudication: Secondary | ICD-10-CM | POA: Diagnosis not present

## 2020-09-16 DIAGNOSIS — Z4789 Encounter for other orthopedic aftercare: Secondary | ICD-10-CM | POA: Diagnosis not present

## 2020-09-16 DIAGNOSIS — R6889 Other general symptoms and signs: Secondary | ICD-10-CM | POA: Diagnosis not present

## 2020-09-16 NOTE — Telephone Encounter (Signed)
Order written for right prosthetic and supplies and faxd to [facility]. Also asked for them to call pt for appt.

## 2020-09-17 ENCOUNTER — Encounter: Payer: Self-pay | Admitting: Internal Medicine

## 2020-09-17 ENCOUNTER — Other Ambulatory Visit: Payer: Self-pay

## 2020-09-17 ENCOUNTER — Ambulatory Visit (INDEPENDENT_AMBULATORY_CARE_PROVIDER_SITE_OTHER): Payer: Medicare HMO | Admitting: Internal Medicine

## 2020-09-17 VITALS — BP 138/70 | HR 71 | Temp 97.6°F | Ht 72.0 in | Wt 318.0 lb

## 2020-09-17 DIAGNOSIS — I10 Essential (primary) hypertension: Secondary | ICD-10-CM

## 2020-09-17 DIAGNOSIS — E1169 Type 2 diabetes mellitus with other specified complication: Secondary | ICD-10-CM

## 2020-09-17 DIAGNOSIS — E7800 Pure hypercholesterolemia, unspecified: Secondary | ICD-10-CM

## 2020-09-17 DIAGNOSIS — E669 Obesity, unspecified: Secondary | ICD-10-CM | POA: Diagnosis not present

## 2020-09-17 DIAGNOSIS — Z0001 Encounter for general adult medical examination with abnormal findings: Secondary | ICD-10-CM

## 2020-09-17 DIAGNOSIS — M79601 Pain in right arm: Secondary | ICD-10-CM

## 2020-09-17 DIAGNOSIS — I700 Atherosclerosis of aorta: Secondary | ICD-10-CM | POA: Diagnosis not present

## 2020-09-17 DIAGNOSIS — E559 Vitamin D deficiency, unspecified: Secondary | ICD-10-CM | POA: Diagnosis not present

## 2020-09-17 DIAGNOSIS — Z23 Encounter for immunization: Secondary | ICD-10-CM

## 2020-09-17 DIAGNOSIS — Z6841 Body Mass Index (BMI) 40.0 and over, adult: Secondary | ICD-10-CM | POA: Diagnosis not present

## 2020-09-17 MED ORDER — PREDNISONE 10 MG PO TABS: ORAL_TABLET | ORAL | 0 refills | Status: DC

## 2020-09-17 NOTE — Progress Notes (Signed)
Patient ID: Travis [name]., male   DOB: 11-27-1950, 70 y.o.   MRN: [id number]         Chief Complaint:: wellness exam and Follow-up  right arm pain and swelling, dm,htn, hld, aortic atherosclerosis       HPI:  Travis [name]. is a 70 y.o. male here for wellness exam; due for eye exam and needs referral, and pneumovax.  Declines labs drawn today due to transport needs to leave.                        Also c/o mid right arm pain and swelling with some swelling only tracking to the right arm with a sort of stiffness fullness.  Plays games on the phone with the right hand and arm many hours of the day, no trauma or falling. O/w Pt denies chest pain, increased sob or doe, wheezing, orthopnea, PND, increased LE swelling, palpitations, dizziness or syncope.   Pt denies polydipsia, polyuria, or new focal neuro s/s.   Pt denies fever, wt loss, night sweats, loss of appetite, or other constitutional symptoms  Has no other new complaints today   Wt Readings from Last 3 Encounters:  09/17/20 (!) 318 lb (144.2 kg)  09/16/20 (!) 319 lb (144.7 kg)  08/11/20 (!) 319 lb (144.7 kg)   BP Readings from Last 3 Encounters:  09/17/20 138/70  09/16/20 (!) 109/59  08/11/20 (!) 141/69   Immunization History  Administered Date(s) Administered  . Fluad Quad(high Dose 65+) 01/24/2019, 02/24/2020  . Influenza Split 06/05/2011, 06/26/2012  . Influenza Whole 03/14/2010  . Influenza,inj,Quad PF,6+ Mos 02/09/2013, 04/16/2014, 03/11/2015, 04/19/2016, 01/26/2017  . Influenza-Unspecified 05/23/2018  . PFIZER(Purple Top)SARS-COV-2 Vaccination 08/22/2019, 09/15/2019, 05/12/2020  . PPD Test 10/04/2011, 04/07/2015  . Pneumococcal Conjugate-13 04/02/2015  . Pneumococcal Polysaccharide-23 02/19/2009, 06/05/2011, 01/28/2014, 09/17/2020  . Td 05/22/2004  . Tdap 10/17/2014   Health Maintenance Due  Topic Date Due  . OPHTHALMOLOGY EXAM  11/20/2019  . URINE MICROALBUMIN  07/23/2020  . HEMOGLOBIN A1C  08/24/2020       Past Medical History:  Diagnosis Date  . ALLERGIC RHINITIS 06/24/2009  . Allergy   . Anemia    IDA  . Anxiety 11/12/2011   Adequate for discharge   . Arthritis    "all my joints" (09/30/2013)  . Arthritis of foot, right, degenerative 04/15/2014  . Balance disorder 03/12/2013  . Benign neoplasm of cecum   . Benign neoplasm of descending colon   . CAD (coronary artery disease) 06/24/2009   5 stents placed in 2007    . Chronic anticoagulation   . Chronic pain syndrome 10/27/2009   of ankle, shoulders, low back.  sciatica.   . Closed fracture of right foot 10/17/2014  . CORONARY ARTERY DISEASE 06/24/2009   a. s/p multiple PCIs - In 2008 he had a Taxus DES to the mild LAD, Endeavor DES to mid LCX and distal LCX. In January 2009 he had DES to distal LCX, mid LCX and proximal LCX. In November 2009 had BMS x 2 to the mid RCA. Cath 10/2011 with patent stents, noncardiac CP. LHC 01/2013: patent stents (noncardiac CP).  . DEGENERATIVE JOINT DISEASE 06/24/2009   Qualifier: Diagnosis of  By: [name], [name]   . Depression   . Depression with anxiety    Prior suicide attempt(08/25/19-pt states not suicide attempt)  . [place] DISEASE, LUMBAR 04/19/2010  . ERECTILE DYSFUNCTION, ORGANIC 05/30/2010  . Essential hypertension 06/24/2009  Qualifier: Diagnosis of  By: [name], [name]   . Fibromyalgia   . Fracture dislocation of ankle joint 09/02/2015  . Gait disorder 03/12/2013  . General weakness 07/14/2014  . GERD (gastroesophageal reflux disease) 09/08/2015   08/25/15-pt states was cardiac origin, not GERD  . Hand joint pain 06/10/2013  . Heart murmur   . Hepatitis C    treated pt. unknown with what he was a teenager  . History of kidney stones   . Hyperlipidemia 07/15/2009   Qualifier: Diagnosis of  By: Aundra Dubin, MD, [place]    . HYPERLIPIDEMIA-MIXED 07/15/2009   08/25/19-pt state cholesterol was normal range  . HYPERTENSION 06/24/2009  . Insomnia 10/04/2011  . Irregular heart beat   . Left hip pain 03/12/2013    Injected under ultrasound guidance on June 24, 2013   . Major depression 09/13/2015  . Myocardial infarction ([place]) 2008  . Non-cardiac chest pain 10/2011, 01/2013  . Obesity   . Occult blood positive stool 10/17/2014  . Open ankle fracture 09/02/2015  . OSA (obstructive sleep apnea)    not using CPAP (09/30/2013)  . Pain of right thumb 04/03/2013  . Pneumonia   . PPD positive 04/08/2015  . Pre-ulcerative corn or callous 02/06/2013  . Rotator cuff tear arthropathy of both shoulders 06/10/2013   History of bilateral shoulder cuff surgery for rotator cuff tears. Reports increase in pain 09/11/2015 during physical therapy of the left shoulder.   [name] SCIATICA, LEFT 04/19/2010   Qualifier: Diagnosis of  By: [name], [name]   . Sleep apnea    wears cpap 08/25/19-States not using due to nasal stuffiness.  [name] Spinal stenosis in cervical region 09/26/2013  . Spinal stenosis, lumbar region, with neurogenic claudication 09/26/2013  . Type II diabetes mellitus ([place]) 2012   no meds in 09/2014.   [name] Umbilical hernia   . Uncontrolled type 2 DM with peripheral circulatory disorder ([place]) 10/04/2013   08/25/19- pt states A1C normal, no diabetes  . URETHRAL STRICTURE 06/24/2009   self catheterizes.    Past Surgical History:  Procedure Laterality Date  . AMPUTATION Right 06/14/2016   Procedure: AMPUTATION BELOW KNEE;  Surgeon: Newt Minion, MD;  Location: [place];  Service: Orthopedics;  Laterality: Right;  . ANKLE FUSION Right 04/15/2014   Procedure: Right Subtalar, Talonavicular Fusion;  Surgeon: Newt Minion, MD;  Location: [place];  Service: Orthopedics;  Laterality: Right;  . ANKLE FUSION Right 04/18/2016   Procedure: Right Ankle Tibiocalcaneal Fusion;  Surgeon: Newt Minion, MD;  Location: [place];  Service: Orthopedics;  Laterality: Right;  . ANTERIOR CERVICAL DECOMP/DISCECTOMY FUSION N/A 09/26/2013   Procedure: ANTERIOR CERVICAL DISCECTOMY FUSION C3-4, plate and screw fixation, allograft bone graft;  Surgeon: Jessy Oto, MD;  Location: [place];  Service: Orthopedics;  Laterality: N/A;  . BACK SURGERY     3  . BELOW KNEE LEG AMPUTATION Right 06/14/2016   right ankle and foot  . CARDIAC CATHETERIZATION  X 1  . CARPAL TUNNEL RELEASE Bilateral   . COLONOSCOPY N/A 10/22/2014   Procedure: COLONOSCOPY;  Surgeon: Lafayette Dragon, MD;  Location[facility] ENDOSCOPY;  Service: Endoscopy;  Laterality: N/A;  . COLONOSCOPY  11/19/2018  . CORONARY ANGIOPLASTY WITH STENT PLACEMENT     "I have 9 stents"  . ESOPHAGOGASTRODUODENOSCOPY N/A 10/19/2014   Procedure: ESOPHAGOGASTRODUODENOSCOPY (EGD);  Surgeon: Jerene Bears, MD;  Location: [facility] ENDOSCOPY;  Service: Endoscopy;  Laterality: N/A;  . FRACTURE SURGERY    .  FUSION OF TALONAVICULAR JOINT Right 04/15/2014   dr duda  . GASTRIC BYPASS  02/20/2019  . HERNIA REPAIR     umbilical  . INGUINAL HERNIA REPAIR Right 05/11/2015   Procedure: LAPAROSCOPIC REPAIR RIGHT  INGUINAL HERNIA;  Surgeon: Greer Pickerel, MD;  Location: [place];  Service: General;  Laterality: Right;  . INSERTION OF MESH Right 05/11/2015   Procedure: INSERTION OF MESH;  Surgeon: Greer Pickerel, MD;  Location: [place];  Service: General;  Laterality: Right;  . JOINT REPLACEMENT    . KNEE CARTILAGE SURGERY Right X 12   "~ 1/2 open; ~ 1/2 scopes"  . KNEE CARTILAGE SURGERY Left X 3   "3 scopes"  . LAPAROSCOPIC GASTRIC SLEEVE RESECTION N/A 03/03/2019   Procedure: LAPAROSCOPIC GASTRIC SLEEVE RESECTION, Upper Endo, ERAS Pathway;  Surgeon: Greer Pickerel, MD;  Location: [facility];  Service: General;  Laterality: N/A;  . LEFT HEART CATHETERIZATION WITH CORONARY ANGIOGRAM N/A 02/10/2013   Procedure: LEFT HEART CATHETERIZATION WITH CORONARY ANGIOGRAM;  Surgeon: Burnell Blanks, MD;  [facility] C[facility] LAB;  Service: Cardiovascular;  Laterality: N/A;  . LUMBAR LAMINECTOMY N/A 07/16/2018   Procedure: LEFT L4-5 REDO PARTIAL LUMBAR HEMILAMINECTOMY WITH FORAMINOTOMY LEFT L4;  Surgeon: Jessy Oto, MD;  Location: [place];  Service:  Orthopedics;  Laterality: N/A;  . LUMBAR LAMINECTOMY/DECOMPRESSION MICRODISCECTOMY N/A 01/27/2014   Procedure: CENTRAL LUMBAR LAMINECTOMY L4-5 AND L3-4;  Surgeon: Jessy Oto, MD;  Location: [place];  Service: Orthopedics;  Laterality: N/A;  . ORIF ANKLE FRACTURE Right 09/02/2015   Procedure: OPEN REDUCTION INTERNAL FIXATION (ORIF) ANKLE FRACTURE;  Surgeon: Newt Minion, MD;  Location: [place];  Service: Orthopedics;  Laterality: Right;  . PERIPHERALLY INSERTED CENTRAL CATHETER INSERTION  09/02/2015  . POLYPECTOMY    . ROTATOR CUFF REPAIR Left    x 2  . ROTATOR CUFF REPAIR Right    x 3  . SHOULDER ARTHROSCOPY W/ ROTATOR CUFF REPAIR Bilateral    "3 on the right; 1 on the left"  . SKIN SPLIT GRAFT Right 10/01/2015   Procedure: RIGHT ANKLE APPLY SKIN GRAFT SPLIT THICKNESS;  Surgeon: Newt Minion, MD;  Location: [place];  Service: Orthopedics;  Laterality: Right;  . TONSILLECTOMY    . TOOTH EXTRACTION    . TOTAL HIP ARTHROPLASTY Right 04/17/2018  . TOTAL HIP ARTHROPLASTY Right 04/17/2018   Procedure: RIGHT TOTAL HIP ARTHROPLASTY;  Surgeon: Newt Minion, MD;  Location: [place];  Service: Orthopedics;  Laterality: Right;  . TOTAL HIP ARTHROPLASTY Left 06/06/2019   Procedure: LEFT TOTAL HIP ARTHROPLASTY ANTERIOR APPROACH;  Surgeon: Mcarthur Rossetti, MD;  Location: [place];  Service: Orthopedics;  Laterality: Left;  . TOTAL HIP REVISION Left 05/24/2019  . TOTAL KNEE ARTHROPLASTY Bilateral 2008  . UMBILICAL HERNIA REPAIR     UHR  . UPPER GASTROINTESTINAL ENDOSCOPY  2016  . URETHRAL DILATION  X 4  . VASECTOMY    . WISDOM TOOTH EXTRACTION      reports that he quit smoking about 10 years ago. His smoking use included cigars. He has never used smokeless tobacco. He reports current alcohol use. He reports that he does not use drugs. family history includes Breast cancer in his mother; Colon polyps in his brother; Coronary artery disease in an other family member; Depression in his brother, mother,  and another family member; Diabetes in his father; Early death in his maternal grandfather, paternal grandfather, and son; Esophageal cancer in his mother; Healthy in his son; Heart attack (  age of onset: 72) in his son; Heart attack (age of onset: 12) in his maternal grandfather; Heart disease in his father, maternal grandfather, and mother; Hyperlipidemia in his father; Hypertension in his brother, father, mother, and another family member; Pancreatic cancer in his mother; Prostate cancer in his father; Skin cancer in his father; Stomach cancer in his mother. No Known Allergies Current Outpatient Medications on File Prior to Visit  Medication Sig Dispense Refill  . albuterol (VENTOLIN HFA) 108 (90 Base) MCG/ACT inhaler Inhale 2 puffs into the lungs every 6 (six) hours as needed for wheezing or shortness of breath. 18 g 1  . ARIPiprazole (ABILIFY) 10 MG tablet Take 1 tablet (10 mg total) by mouth daily. 90 tablet 0  . aspirin 81 MG chewable tablet Chew 1 tablet (81 mg total) by mouth daily. 35 tablet 0  . atorvastatin (LIPITOR) 20 MG tablet Take 1 tablet (20 mg total) by mouth daily. (Patient taking differently: Take 20 mg by mouth daily.) 90 tablet 0  . benztropine (COGENTIN) 0.5 MG tablet Take 1 tablet (0.5 mg total) by mouth at bedtime. 90 tablet 0  . calcium carbonate (TUMS - DOSED IN MG ELEMENTAL CALCIUM) 500 MG chewable tablet Chew 1 tablet by mouth 3 (three) times daily with meals.    . celecoxib (CELEBREX) 200 MG capsule Take 1 capsule (200 mg total) by mouth 2 (two) times daily. 40 capsule 0  . DULoxetine (CYMBALTA) 30 MG capsule Take 1 capsule (30 mg total) by mouth daily. 180 capsule 0  . hydrochlorothiazide (HYDRODIURIL) 25 MG tablet Take 1 tablet (25 mg total) by mouth daily. (Patient taking differently: Take 25 mg by mouth daily.) 90 tablet 3  . HYDROcodone-acetaminophen (NORCO) 10-325 MG tablet Take 1 tablet by mouth every 6 (six) hours as needed for moderate pain. 30 tablet 0  . iron  polysaccharides (NU-IRON) 150 MG capsule Take 1 capsule (150 mg total) by mouth daily. 90 capsule 1  . Melatonin 5 MG TABS Take 15 mg by mouth at bedtime.    . methocarbamol (ROBAXIN) 500 MG tablet Take 1 tablet (500 mg total) by mouth every 8 (eight) hours as needed for muscle spasms. 60 tablet 0  . MYRBETRIQ 50 MG TB24 tablet Take 50 mg by mouth at bedtime.     . Omega-3 Fatty Acids (FISH OIL) 1000 MG CAPS Take 1,000 mg by mouth at bedtime.     [name] oxybutynin (DITROPAN-XL) 10 MG 24 hr tablet Take 10 mg by mouth daily.    [name] oxymetazoline (AFRIN) 0.05 % nasal spray Place 1 spray into both nostrils 2 (two) times daily as needed for congestion.    . pantoprazole (PROTONIX) 40 MG tablet Take 1 tablet (40 mg total) by mouth 2 (two) times daily. (Patient taking differently: Take 40 mg by mouth 2 (two) times daily before a meal.) 60 tablet 2  . phentermine 37.5 MG capsule Take 1 capsule (37.5 mg total) by mouth every morning. 30 capsule 0  . prasugrel (EFFIENT) 5 MG TABS tablet Take 1 tablet (5 mg total) by mouth daily. 90 tablet 3  . pregabalin (LYRICA) 200 MG capsule Take 1 capsule (200 mg total) by mouth in the morning, at noon, and at bedtime. 60 capsule 0  . sildenafil (VIAGRA) 100 MG tablet Take 100 mg by mouth daily as needed for erectile dysfunction.     . tamsulosin (FLOMAX) 0.4 MG CAPS capsule Take 0.4 mg by mouth 2 (two) times daily.    . Testosterone  1.62 % GEL Place 1 Pump onto the skin daily. 88 g 1  . traZODone (DESYREL) 150 MG tablet Take 1 tablet (150 mg total) by mouth at bedtime. 90 tablet 0  . ZENPEP 40000-126000 units CPEP Take 2 capsules (80,000 units) with meals and 1 capsule (40,000 units) with each snack (Patient taking differently: Take 1-2 capsules by mouth See admin instructions. Take 2 capsules (80,000 units) with meals and 1 capsule (40,000 units) with each snack) 720 capsule 1  . zolpidem (AMBIEN) 10 MG tablet Take 1 tablet (10 mg total) by mouth at bedtime as needed for sleep.  90 tablet 1  . Azelastine-Fluticasone 137-50 MCG/ACT SUSP Use both nostrils as directed twice daily (Patient not taking: Reported on 09/17/2020) 23 g 11  . diclofenac Sodium (VOLTAREN) 1 % GEL Apply 4 g topically 3 (three) times daily as needed (pain). (Patient not taking: Reported on 09/17/2020) 350 g 5   No current facility-administered medications on file prior to visit.        ROS:  All others reviewed and negative.  Objective        PE:  BP 138/70 (BP Location: Right Arm, Patient Position: Sitting, Cuff Size: Large)   Pulse 71   Temp 97.6 F (36.4 C) (Oral)   Ht 6' (1.829 m)   Wt (!) 318 lb (144.2 kg)   SpO2 97%   BMI 43.13 kg/m                 Constitutional: Pt appears in NAD               HENT: Head: NCAT.                Right Ear: External ear normal.                 Left Ear: External ear normal.                Eyes: . Pupils are equal, round, and reactive to light. Conjunctivae and EOM are normal               Nose: without d/c or deformity               Neck: Neck supple. Gross normal ROM               Cardiovascular: Normal rate and regular rhythm.                 Pulmonary/Chest: Effort normal and breath sounds without rales or wheezing.                Abd:  Soft, NT, ND, + BS, no organomegaly               Neurological: Pt is alert. At baseline orientation, motor grossly intact except s//p RLE partial amputation; right post arm with diffuse mild tender swelling               Skin: Skin is warm. No rashes, no other new lesions, LE edema - none to LLE               Psychiatric: Pt behavior is normal without agitation   Micro: none  Cardiac tracings I have personally interpreted today:  none  Pertinent Radiological findings (summarize): none   Lab Results  Component Value Date   WBC 6.7 07/31/2020   HGB 10.7 (L) 07/31/2020   HCT 31.3 (L) 07/31/2020   PLT 247 07/31/2020   GLUCOSE  202 (H) 07/28/2020   CHOL 134 02/24/2020   TRIG 53.0 02/24/2020   HDL 59.30  02/24/2020   LDLCALC 64 02/24/2020   ALT 18 07/23/2020   AST 19 07/23/2020   NA 134 (L) 07/28/2020   K 4.3 07/28/2020   CL 104 07/28/2020   CREATININE 1.02 07/28/2020   BUN 18 07/28/2020   CO2 22 07/28/2020   TSH 2.52 02/24/2020   PSA 0.56 07/24/2019   INR 1.1 06/03/2019   HGBA1C 5.7 (A) 02/24/2020   MICROALBUR <0.7 07/24/2019   Assessment/Plan:  [name] is a 70 y.o. White or Caucasian [1] male with  has a past medical history of ALLERGIC RHINITIS (06/24/2009), Allergy, Anemia, Anxiety (11/12/2011), Arthritis, Arthritis of foot, right, degenerative (04/15/2014), Balance disorder (03/12/2013), Benign neoplasm of cecum, Benign neoplasm of descending colon, CAD (coronary artery disease) (06/24/2009), Chronic anticoagulation, Chronic pain syndrome (10/27/2009), Closed fracture of right foot (10/17/2014), CORONARY ARTERY DISEASE (06/24/2009), DEGENERATIVE JOINT DISEASE (06/24/2009), Depression, Depression with anxiety, DISC DISEASE, LUMBAR (04/19/2010), ERECTILE DYSFUNCTION, ORGANIC (05/30/2010), Essential hypertension (06/24/2009), Fibromyalgia, Fracture dislocation of ankle joint (09/02/2015), Gait disorder (03/12/2013), General weakness (07/14/2014), GERD (gastroesophageal reflux disease) (09/08/2015), Hand joint pain (06/10/2013), Heart murmur, Hepatitis C, History of kidney stones, Hyperlipidemia (07/15/2009), HYPERLIPIDEMIA-MIXED (07/15/2009), HYPERTENSION (06/24/2009), Insomnia (10/04/2011), Irregular heart beat, Left hip pain (03/12/2013), Major depression (09/13/2015), Myocardial infarction ([place]) (2008), Non-cardiac chest pain (10/2011, 01/2013), Obesity, Occult blood positive stool (10/17/2014), Open ankle fracture (09/02/2015), OSA (obstructive sleep apnea), Pain of right thumb (04/03/2013), Pneumonia, PPD positive (04/08/2015), Pre-ulcerative corn or callous (02/06/2013), Rotator cuff tear arthropathy of both shoulders (06/10/2013), SCIATICA, LEFT (04/19/2010), Sleep apnea, Spinal stenosis in cervical region  (09/26/2013), Spinal stenosis, lumbar region, with neurogenic claudication (09/26/2013), Type II diabetes mellitus ([place]) (9983), Umbilical hernia, Uncontrolled type 2 DM with peripheral circulatory disorder ([place]) (10/04/2013), and URETHRAL STRICTURE (06/24/2009).  Encounter for well adult exam with abnormal findings Age and sex appropriate education and counseling updated with regular exercise and diet Referrals for preventative services - for optho referral Immunizations addressed - for pneumovax today Smoking counseling  - none needed Evidence for depression or other mood disorder - none significant Most recent labs reviewed. I have personally reviewed and have noted: 1) the patient's medical and social history 2) The patient's current medications and supplements 3) The patient's height, weight, and BMI have been recorded in the chart   Vitamin D deficiency Last vitamin D Lab Results  Component Value Date   VD25OH 29.84 (L) 02/24/2020   Low, to start oral replacement   Hyperlipidemia Lab Results  Component Value Date   LDLCALC 64 02/24/2020   Stable, pt to continue current statin lipitor 20  Essential hypertension BP Readings from Last 3 Encounters:  09/17/20 138/70  09/16/20 (!) 109/59  08/11/20 (!) 141/69   Stable, pt to continue medical treatment hct   Diabetes mellitus type 2 in obese [facility]) Lab Results  Component Value Date   HGBA1C 5.7 (A) 02/24/2020   Stable, pt to continue current medical treatment  - diet   Aortic atherosclerosis ([place]) To continue diet, exercise and lipitor 20  Right arm pain Exam c/w tendonitis due to overuse with playing games on phone, for arm rest, to f/u any worsening symptoms or concerns  Followup: Return in about 3 months (around 12/17/2020).  [name], MD 09/18/2020 1:13 AM [facility]

## 2020-09-17 NOTE — Patient Instructions (Addendum)
You had the pneumovax pneumonia shot today  Please take all new medication as prescribed - the prednisone  Please continue all other medications as before, and refills have been done if requested.  Please have the pharmacy call with any other refills you may need.  Please continue your efforts at being more active, low cholesterol diet, and weight control.  You are otherwise up to date with prevention measures today.  Please keep your appointments with your specialists as you may have planned  You will be contacted regarding the referral for: eye doctor  Please make an Appointment to return in 3 months

## 2020-09-17 NOTE — Progress Notes (Signed)
70 year old white male who is 6-week status post RIGHT L1-2, LEFT L2-3, L3-4, L4-5 AND L5-S1 TRANSFORAMINAL LUMBAR INTERBODY FUSIONS, MPACT PEDICLE SCREWS AND RODS, CAGES L1-S1, LOCAL BONE GRAFT, ALLOGRAFT CANCELLOUS CHIPS, VIVIGEN returns.  States that he is doing well.  Does not need refill of pain medications.  He has been compliant with wearing his brace.  Exam Very pleasant white male alert and oriented in no acute distress.  Surgical incision is well-healed.  Neurologically intact.  Plan No x-rays obtained today.  Patient will follow-up with Dr. Louanne Skye in 6 weeks for recheck and he will get x-rays at that visit.  Must continue wearing his brace.  No driving.  No lifting bending twisting.

## 2020-09-18 ENCOUNTER — Encounter: Payer: Self-pay | Admitting: Internal Medicine

## 2020-09-18 DIAGNOSIS — M79601 Pain in right arm: Secondary | ICD-10-CM | POA: Insufficient documentation

## 2020-09-18 NOTE — Assessment & Plan Note (Signed)
Last vitamin D Lab Results  Component Value Date   VD25OH 29.84 (L) 02/24/2020   Low, to start oral replacement

## 2020-09-18 NOTE — Assessment & Plan Note (Addendum)
Age and sex appropriate education and counseling updated with regular exercise and diet Referrals for preventative services - for optho referral Immunizations addressed - for pneumovax today Smoking counseling  - none needed Evidence for depression or other mood disorder - none significant Most recent labs reviewed. I have personally reviewed and have noted: 1) the patient's medical and social history 2) The patient's current medications and supplements 3) The patient's height, weight, and BMI have been recorded in the chart

## 2020-09-18 NOTE — Assessment & Plan Note (Signed)
BP Readings from Last 3 Encounters:  09/17/20 138/70  09/16/20 (!) 109/59  08/11/20 (!) 141/69   Stable, pt to continue medical treatment hct

## 2020-09-18 NOTE — Assessment & Plan Note (Signed)
Exam c/w tendonitis due to overuse with playing games on phone, for arm rest, to f/u any worsening symptoms or concerns

## 2020-09-18 NOTE — Assessment & Plan Note (Signed)
Lab Results  Component Value Date   HGBA1C 5.7 (A) 02/24/2020   Stable, pt to continue current medical treatment  - diet

## 2020-09-18 NOTE — Assessment & Plan Note (Signed)
To continue diet, exercise and lipitor 20

## 2020-09-18 NOTE — Assessment & Plan Note (Signed)
Lab Results  Component Value Date   LDLCALC 64 02/24/2020   Stable, pt to continue current statin lipitor 20

## 2020-09-20 ENCOUNTER — Encounter: Payer: Self-pay | Admitting: Internal Medicine

## 2020-09-21 ENCOUNTER — Telehealth: Payer: Self-pay | Admitting: Internal Medicine

## 2020-09-21 NOTE — Telephone Encounter (Signed)
Mallory from [place] calling,requesting 1 additional OT visit [phone]

## 2020-09-22 DIAGNOSIS — Z4789 Encounter for other orthopedic aftercare: Secondary | ICD-10-CM | POA: Diagnosis not present

## 2020-09-22 DIAGNOSIS — I110 Hypertensive heart disease with heart failure: Secondary | ICD-10-CM | POA: Diagnosis not present

## 2020-09-22 DIAGNOSIS — I252 Old myocardial infarction: Secondary | ICD-10-CM | POA: Diagnosis not present

## 2020-09-22 DIAGNOSIS — I509 Heart failure, unspecified: Secondary | ICD-10-CM | POA: Diagnosis not present

## 2020-09-22 DIAGNOSIS — M4802 Spinal stenosis, cervical region: Secondary | ICD-10-CM | POA: Diagnosis not present

## 2020-09-22 DIAGNOSIS — I2510 Atherosclerotic heart disease of native coronary artery without angina pectoris: Secondary | ICD-10-CM | POA: Diagnosis not present

## 2020-09-22 DIAGNOSIS — M797 Fibromyalgia: Secondary | ICD-10-CM | POA: Diagnosis not present

## 2020-09-22 DIAGNOSIS — M48062 Spinal stenosis, lumbar region with neurogenic claudication: Secondary | ICD-10-CM | POA: Diagnosis not present

## 2020-09-22 DIAGNOSIS — I499 Cardiac arrhythmia, unspecified: Secondary | ICD-10-CM | POA: Diagnosis not present

## 2020-09-23 ENCOUNTER — Encounter (HOSPITAL_COMMUNITY): Payer: Self-pay | Admitting: *Deleted

## 2020-09-23 DIAGNOSIS — I509 Heart failure, unspecified: Secondary | ICD-10-CM | POA: Diagnosis not present

## 2020-09-23 DIAGNOSIS — M797 Fibromyalgia: Secondary | ICD-10-CM | POA: Diagnosis not present

## 2020-09-23 DIAGNOSIS — I252 Old myocardial infarction: Secondary | ICD-10-CM | POA: Diagnosis not present

## 2020-09-23 DIAGNOSIS — I110 Hypertensive heart disease with heart failure: Secondary | ICD-10-CM | POA: Diagnosis not present

## 2020-09-23 DIAGNOSIS — I2510 Atherosclerotic heart disease of native coronary artery without angina pectoris: Secondary | ICD-10-CM | POA: Diagnosis not present

## 2020-09-23 DIAGNOSIS — Z4789 Encounter for other orthopedic aftercare: Secondary | ICD-10-CM | POA: Diagnosis not present

## 2020-09-23 DIAGNOSIS — M48062 Spinal stenosis, lumbar region with neurogenic claudication: Secondary | ICD-10-CM | POA: Diagnosis not present

## 2020-09-23 DIAGNOSIS — M4802 Spinal stenosis, cervical region: Secondary | ICD-10-CM | POA: Diagnosis not present

## 2020-09-23 DIAGNOSIS — I499 Cardiac arrhythmia, unspecified: Secondary | ICD-10-CM | POA: Diagnosis not present

## 2020-09-27 DIAGNOSIS — M797 Fibromyalgia: Secondary | ICD-10-CM | POA: Diagnosis not present

## 2020-09-27 DIAGNOSIS — I252 Old myocardial infarction: Secondary | ICD-10-CM | POA: Diagnosis not present

## 2020-09-27 DIAGNOSIS — I509 Heart failure, unspecified: Secondary | ICD-10-CM | POA: Diagnosis not present

## 2020-09-27 DIAGNOSIS — I2510 Atherosclerotic heart disease of native coronary artery without angina pectoris: Secondary | ICD-10-CM | POA: Diagnosis not present

## 2020-09-27 DIAGNOSIS — I499 Cardiac arrhythmia, unspecified: Secondary | ICD-10-CM | POA: Diagnosis not present

## 2020-09-27 DIAGNOSIS — M4802 Spinal stenosis, cervical region: Secondary | ICD-10-CM | POA: Diagnosis not present

## 2020-09-27 DIAGNOSIS — M48062 Spinal stenosis, lumbar region with neurogenic claudication: Secondary | ICD-10-CM | POA: Diagnosis not present

## 2020-09-27 DIAGNOSIS — Z4789 Encounter for other orthopedic aftercare: Secondary | ICD-10-CM | POA: Diagnosis not present

## 2020-09-27 DIAGNOSIS — I110 Hypertensive heart disease with heart failure: Secondary | ICD-10-CM | POA: Diagnosis not present

## 2020-09-28 ENCOUNTER — Telehealth: Payer: Self-pay | Admitting: Internal Medicine

## 2020-09-28 DIAGNOSIS — I110 Hypertensive heart disease with heart failure: Secondary | ICD-10-CM | POA: Diagnosis not present

## 2020-09-28 DIAGNOSIS — M4802 Spinal stenosis, cervical region: Secondary | ICD-10-CM | POA: Diagnosis not present

## 2020-09-28 DIAGNOSIS — I499 Cardiac arrhythmia, unspecified: Secondary | ICD-10-CM | POA: Diagnosis not present

## 2020-09-28 DIAGNOSIS — I2510 Atherosclerotic heart disease of native coronary artery without angina pectoris: Secondary | ICD-10-CM | POA: Diagnosis not present

## 2020-09-28 DIAGNOSIS — I509 Heart failure, unspecified: Secondary | ICD-10-CM | POA: Diagnosis not present

## 2020-09-28 DIAGNOSIS — M797 Fibromyalgia: Secondary | ICD-10-CM | POA: Diagnosis not present

## 2020-09-28 DIAGNOSIS — M48062 Spinal stenosis, lumbar region with neurogenic claudication: Secondary | ICD-10-CM | POA: Diagnosis not present

## 2020-09-28 DIAGNOSIS — I252 Old myocardial infarction: Secondary | ICD-10-CM | POA: Diagnosis not present

## 2020-09-28 DIAGNOSIS — E669 Obesity, unspecified: Secondary | ICD-10-CM

## 2020-09-28 DIAGNOSIS — Z4789 Encounter for other orthopedic aftercare: Secondary | ICD-10-CM | POA: Diagnosis not present

## 2020-09-28 DIAGNOSIS — E1169 Type 2 diabetes mellitus with other specified complication: Secondary | ICD-10-CM

## 2020-09-28 NOTE — Telephone Encounter (Signed)
Ok done

## 2020-09-28 NOTE — Telephone Encounter (Signed)
   [place] Name: [place] Agency Name: [place] Phone #: [phone] Service Requested: OT Frequency of Visits: [phone]

## 2020-09-28 NOTE — Telephone Encounter (Signed)
Patient called and said that he needs a referral to an eye doctor. He can be reached at [phone]. Please advise

## 2020-09-29 DIAGNOSIS — Z89619 Acquired absence of unspecified leg above knee: Secondary | ICD-10-CM | POA: Diagnosis not present

## 2020-09-29 DIAGNOSIS — E119 Type 2 diabetes mellitus without complications: Secondary | ICD-10-CM | POA: Diagnosis not present

## 2020-09-29 DIAGNOSIS — R6889 Other general symptoms and signs: Secondary | ICD-10-CM | POA: Diagnosis not present

## 2020-09-29 DIAGNOSIS — G4733 Obstructive sleep apnea (adult) (pediatric): Secondary | ICD-10-CM | POA: Diagnosis not present

## 2020-09-29 DIAGNOSIS — Z9884 Bariatric surgery status: Secondary | ICD-10-CM | POA: Diagnosis not present

## 2020-09-29 DIAGNOSIS — Z7902 Long term (current) use of antithrombotics/antiplatelets: Secondary | ICD-10-CM | POA: Diagnosis not present

## 2020-09-29 DIAGNOSIS — I10 Essential (primary) hypertension: Secondary | ICD-10-CM | POA: Diagnosis not present

## 2020-09-29 DIAGNOSIS — I2510 Atherosclerotic heart disease of native coronary artery without angina pectoris: Secondary | ICD-10-CM | POA: Diagnosis not present

## 2020-09-29 NOTE — Telephone Encounter (Signed)
Ok for verbals 

## 2020-09-29 NOTE — Telephone Encounter (Signed)
Verbals left on [employer].

## 2020-09-29 NOTE — Telephone Encounter (Signed)
Patient notified

## 2020-10-01 DIAGNOSIS — M4802 Spinal stenosis, cervical region: Secondary | ICD-10-CM | POA: Diagnosis not present

## 2020-10-01 DIAGNOSIS — Z4789 Encounter for other orthopedic aftercare: Secondary | ICD-10-CM | POA: Diagnosis not present

## 2020-10-01 DIAGNOSIS — I110 Hypertensive heart disease with heart failure: Secondary | ICD-10-CM | POA: Diagnosis not present

## 2020-10-01 DIAGNOSIS — I252 Old myocardial infarction: Secondary | ICD-10-CM | POA: Diagnosis not present

## 2020-10-01 DIAGNOSIS — I499 Cardiac arrhythmia, unspecified: Secondary | ICD-10-CM | POA: Diagnosis not present

## 2020-10-01 DIAGNOSIS — I509 Heart failure, unspecified: Secondary | ICD-10-CM | POA: Diagnosis not present

## 2020-10-01 DIAGNOSIS — I2510 Atherosclerotic heart disease of native coronary artery without angina pectoris: Secondary | ICD-10-CM | POA: Diagnosis not present

## 2020-10-01 DIAGNOSIS — M797 Fibromyalgia: Secondary | ICD-10-CM | POA: Diagnosis not present

## 2020-10-01 DIAGNOSIS — M48062 Spinal stenosis, lumbar region with neurogenic claudication: Secondary | ICD-10-CM | POA: Diagnosis not present

## 2020-10-04 DIAGNOSIS — R6889 Other general symptoms and signs: Secondary | ICD-10-CM | POA: Diagnosis not present

## 2020-10-05 DIAGNOSIS — I110 Hypertensive heart disease with heart failure: Secondary | ICD-10-CM | POA: Diagnosis not present

## 2020-10-05 DIAGNOSIS — I499 Cardiac arrhythmia, unspecified: Secondary | ICD-10-CM | POA: Diagnosis not present

## 2020-10-05 DIAGNOSIS — I2510 Atherosclerotic heart disease of native coronary artery without angina pectoris: Secondary | ICD-10-CM | POA: Diagnosis not present

## 2020-10-05 DIAGNOSIS — I252 Old myocardial infarction: Secondary | ICD-10-CM | POA: Diagnosis not present

## 2020-10-05 DIAGNOSIS — I509 Heart failure, unspecified: Secondary | ICD-10-CM | POA: Diagnosis not present

## 2020-10-05 DIAGNOSIS — M4802 Spinal stenosis, cervical region: Secondary | ICD-10-CM | POA: Diagnosis not present

## 2020-10-05 DIAGNOSIS — M48062 Spinal stenosis, lumbar region with neurogenic claudication: Secondary | ICD-10-CM | POA: Diagnosis not present

## 2020-10-05 DIAGNOSIS — M797 Fibromyalgia: Secondary | ICD-10-CM | POA: Diagnosis not present

## 2020-10-05 DIAGNOSIS — Z4789 Encounter for other orthopedic aftercare: Secondary | ICD-10-CM | POA: Diagnosis not present

## 2020-10-06 ENCOUNTER — Ambulatory Visit: Payer: Medicare HMO | Admitting: Family

## 2020-10-06 ENCOUNTER — Ambulatory Visit (INDEPENDENT_AMBULATORY_CARE_PROVIDER_SITE_OTHER): Payer: Medicare HMO | Admitting: Psychology

## 2020-10-06 DIAGNOSIS — M4802 Spinal stenosis, cervical region: Secondary | ICD-10-CM | POA: Diagnosis not present

## 2020-10-06 DIAGNOSIS — F331 Major depressive disorder, recurrent, moderate: Secondary | ICD-10-CM

## 2020-10-06 DIAGNOSIS — M797 Fibromyalgia: Secondary | ICD-10-CM | POA: Diagnosis not present

## 2020-10-06 DIAGNOSIS — I110 Hypertensive heart disease with heart failure: Secondary | ICD-10-CM | POA: Diagnosis not present

## 2020-10-06 DIAGNOSIS — I2510 Atherosclerotic heart disease of native coronary artery without angina pectoris: Secondary | ICD-10-CM | POA: Diagnosis not present

## 2020-10-06 DIAGNOSIS — Z4789 Encounter for other orthopedic aftercare: Secondary | ICD-10-CM | POA: Diagnosis not present

## 2020-10-06 DIAGNOSIS — I509 Heart failure, unspecified: Secondary | ICD-10-CM | POA: Diagnosis not present

## 2020-10-06 DIAGNOSIS — M48062 Spinal stenosis, lumbar region with neurogenic claudication: Secondary | ICD-10-CM | POA: Diagnosis not present

## 2020-10-06 DIAGNOSIS — I252 Old myocardial infarction: Secondary | ICD-10-CM | POA: Diagnosis not present

## 2020-10-06 DIAGNOSIS — I499 Cardiac arrhythmia, unspecified: Secondary | ICD-10-CM | POA: Diagnosis not present

## 2020-10-11 ENCOUNTER — Other Ambulatory Visit: Payer: Self-pay

## 2020-10-11 ENCOUNTER — Ambulatory Visit (INDEPENDENT_AMBULATORY_CARE_PROVIDER_SITE_OTHER): Payer: Medicare HMO | Admitting: Internal Medicine

## 2020-10-11 ENCOUNTER — Encounter: Payer: Self-pay | Admitting: Internal Medicine

## 2020-10-11 VITALS — BP 152/78 | HR 57 | Temp 98.1°F | Ht 72.0 in | Wt 325.0 lb

## 2020-10-11 DIAGNOSIS — R413 Other amnesia: Secondary | ICD-10-CM | POA: Diagnosis not present

## 2020-10-11 DIAGNOSIS — F320 Major depressive disorder, single episode, mild: Secondary | ICD-10-CM

## 2020-10-11 DIAGNOSIS — I10 Essential (primary) hypertension: Secondary | ICD-10-CM

## 2020-10-11 MED ORDER — DONEPEZIL HCL 5 MG PO TABS: 5.0000 mg | ORAL_TABLET | Freq: Every day | ORAL | 3 refills | Status: DC

## 2020-10-11 MED ORDER — LOSARTAN POTASSIUM 50 MG PO TABS: 50.0000 mg | ORAL_TABLET | Freq: Every day | ORAL | 3 refills | Status: DC

## 2020-10-11 NOTE — Assessment & Plan Note (Signed)
likley new onset MCI, not clear if he is eligible for MRI due to hx of 9 cardiac stents, for aricept 5 qd and refer neurology

## 2020-10-11 NOTE — Assessment & Plan Note (Signed)
Uncontrolled, to add losartan 50 qd

## 2020-10-11 NOTE — Assessment & Plan Note (Signed)
Stable on current meds, doubt is significant element in his memory changes

## 2020-10-11 NOTE — Progress Notes (Signed)
Patient ID: Travis Dyer., male   DOB: 1951/02/08, 70 y.o.   MRN: [id number]        Chief Complaint: memory changes       HPI:  Travis Werth. is a 70 y.o. male here after 6 mo onset noticeable memory change whereas he used to be sharp but now with new unable to recall some names and not sure of what his meds are and what they are for; often loses track of why he is doing somethiing or what he is trying to say, Pt denies chest pain, increased sob or doe, wheezing, orthopnea, PND, increased LE swelling, palpitations, dizziness or syncope.   Pt denies polydipsia, polyuria, or new focal neuro s/s.   Pt denies fever, wt loss, night sweats, loss of appetite, or other constitutional symptoms  Denies worsening depressive symptoms, suicidal ideation, or panic; and no recent med changes.  BP at home has also been about 150/90       Wt Readings from Last 3 Encounters:  10/11/20 (!) 325 lb (147.4 kg)  09/17/20 (!) 318 lb (144.2 kg)  09/16/20 (!) 319 lb (144.7 kg)   BP Readings from Last 3 Encounters:  10/11/20 (!) 152/78  09/17/20 138/70  09/16/20 (!) 109/59         Past Medical History:  Diagnosis Date  . ALLERGIC RHINITIS 06/24/2009  . Allergy   . Anemia    IDA  . Anxiety 11/12/2011   Adequate for discharge   . Arthritis    "all my joints" (09/30/2013)  . Arthritis of foot, right, degenerative 04/15/2014  . Balance disorder 03/12/2013  . Benign neoplasm of cecum   . Benign neoplasm of descending colon   . CAD (coronary artery disease) 06/24/2009   5 stents placed in 2007    . Chronic anticoagulation   . Chronic pain syndrome 10/27/2009   of ankle, shoulders, low back.  sciatica.   . Closed fracture of right foot 10/17/2014  . CORONARY ARTERY DISEASE 06/24/2009   a. s/p multiple PCIs - In 2008 he had a Taxus DES to the mild LAD, Endeavor DES to mid LCX and distal LCX. In January 2009 he had DES to distal LCX, mid LCX and proximal LCX. In November 2009 had BMS x 2 to the mid RCA.  Cath 10/2011 with patent stents, noncardiac CP. LHC 01/2013: patent stents (noncardiac CP).  . DEGENERATIVE JOINT DISEASE 06/24/2009   Qualifier: Diagnosis of  By: [name], [name]   . Depression   . Depression with anxiety    Prior suicide attempt(08/25/19-pt states not suicide attempt)  . Hydro DISEASE, LUMBAR 04/19/2010  . ERECTILE DYSFUNCTION, ORGANIC 05/30/2010  . Essential hypertension 06/24/2009   Qualifier: Diagnosis of  By: [name], [name]   . Fibromyalgia   . Fracture dislocation of ankle joint 09/02/2015  . Gait disorder 03/12/2013  . General weakness 07/14/2014  . GERD (gastroesophageal reflux disease) 09/08/2015   08/25/15-pt states was cardiac origin, not GERD  . Hand joint pain 06/10/2013  . Heart murmur   . Hepatitis C    treated pt. unknown with what he was a teenager  . History of kidney stones   . Hyperlipidemia 07/15/2009   Qualifier: Diagnosis of  By: Aundra Dubin, MD, [place]    . HYPERLIPIDEMIA-MIXED 07/15/2009   08/25/19-pt state cholesterol was normal range  . HYPERTENSION 06/24/2009  . Insomnia 10/04/2011  . Irregular heart beat   . Left hip pain 03/12/2013   Injected  under ultrasound guidance on June 24, 2013   . Major depression 09/13/2015  . Myocardial infarction ([place]) 2008  . Non-cardiac chest pain 10/2011, 01/2013  . Obesity   . Occult blood positive stool 10/17/2014  . Open ankle fracture 09/02/2015  . OSA (obstructive sleep apnea)    not using CPAP (09/30/2013)  . Pain of right thumb 04/03/2013  . Pneumonia   . PPD positive 04/08/2015  . Pre-ulcerative corn or callous 02/06/2013  . Rotator cuff tear arthropathy of both shoulders 06/10/2013   History of bilateral shoulder cuff surgery for rotator cuff tears. Reports increase in pain 09/11/2015 during physical therapy of the left shoulder.   [name] SCIATICA, LEFT 04/19/2010   Qualifier: Diagnosis of  By: [name], [name]   . Sleep apnea    wears cpap 08/25/19-States not using due to nasal stuffiness.  [name] Spinal stenosis in  cervical region 09/26/2013  . Spinal stenosis, lumbar region, with neurogenic claudication 09/26/2013  . Type II diabetes mellitus ([place]) 2012   no meds in 09/2014.   [name] Umbilical hernia   . Uncontrolled type 2 DM with peripheral circulatory disorder ([place]) 10/04/2013   08/25/19- pt states A1C normal, no diabetes  . URETHRAL STRICTURE 06/24/2009   self catheterizes.    Past Surgical History:  Procedure Laterality Date  . AMPUTATION Right 06/14/2016   Procedure: AMPUTATION BELOW KNEE;  Surgeon: Newt Minion, MD;  Location: [place];  Service: Orthopedics;  Laterality: Right;  . ANKLE FUSION Right 04/15/2014   Procedure: Right Subtalar, Talonavicular Fusion;  Surgeon: Newt Minion, MD;  Location: [place];  Service: Orthopedics;  Laterality: Right;  . ANKLE FUSION Right 04/18/2016   Procedure: Right Ankle Tibiocalcaneal Fusion;  Surgeon: Newt Minion, MD;  Location: [place];  Service: Orthopedics;  Laterality: Right;  . ANTERIOR CERVICAL DECOMP/DISCECTOMY FUSION N/A 09/26/2013   Procedure: ANTERIOR CERVICAL DISCECTOMY FUSION C3-4, plate and screw fixation, allograft bone graft;  Surgeon: Jessy Oto, MD;  Location: [place];  Service: Orthopedics;  Laterality: N/A;  . BACK SURGERY     3  . BELOW KNEE LEG AMPUTATION Right 06/14/2016   right ankle and foot  . CARDIAC CATHETERIZATION  X 1  . CARPAL TUNNEL RELEASE Bilateral   . COLONOSCOPY N/A 10/22/2014   Procedure: COLONOSCOPY;  Surgeon: Lafayette Dragon, MD;  Location: [facility] ENDOSCOPY;  Service: Endoscopy;  Laterality: N/A;  . COLONOSCOPY  11/19/2018  . CORONARY ANGIOPLASTY WITH STENT PLACEMENT     "I have 9 stents"  . ESOPHAGOGASTRODUODENOSCOPY N/A 10/19/2014   Procedure: ESOPHAGOGASTRODUODENOSCOPY (EGD);  Surgeon: Jerene Bears, MD;  Location: [facility] ENDOSCOPY;  Service: Endoscopy;  Laterality: N/A;  . FRACTURE SURGERY    . FUSION OF TALONAVICULAR JOINT Right 04/15/2014   dr duda  . GASTRIC BYPASS  02/20/2019  . HERNIA REPAIR     umbilical  . INGUINAL HERNIA  REPAIR Right 05/11/2015   Procedure: LAPAROSCOPIC REPAIR RIGHT  INGUINAL HERNIA;  Surgeon: Greer Pickerel, MD;  Location: [place];  Service: General;  Laterality: Right;  . INSERTION OF MESH Right 05/11/2015   Procedure: INSERTION OF MESH;  Surgeon: Greer Pickerel, MD;  Location: [place];  Service: General;  Laterality: Right;  . JOINT REPLACEMENT    . KNEE CARTILAGE SURGERY Right X 12   "~ 1/2 open; ~ 1/2 scopes"  . KNEE CARTILAGE SURGERY Left X 3   "3 scopes"  . LAPAROSCOPIC GASTRIC SLEEVE RESECTION N/A 03/03/2019   Procedure: LAPAROSCOPIC GASTRIC SLEEVE RESECTION,  Upper Endo, ERAS Pathway;  Surgeon: Greer Pickerel, MD;  Location: [facility];  Service: General;  Laterality: N/A;  . LEFT HEART CATHETERIZATION WITH CORONARY ANGIOGRAM N/A 02/10/2013   Procedure: LEFT HEART CATHETERIZATION WITH CORONARY ANGIOGRAM;  Surgeon: Burnell Blanks, MD;  Location[facility] CATH LAB;  Service: Cardiovascular;  Laterality: N/A;  . LUMBAR LAMINECTOMY N/A 07/16/2018   Procedure: LEFT L4-5 REDO PARTIAL LUMBAR HEMILAMINECTOMY WITH FORAMINOTOMY LEFT L4;  Surgeon: Jessy Oto, MD;  Location: [place];  Service: Orthopedics;  Laterality: N/A;  . LUMBAR LAMINECTOMY/DECOMPRESSION MICRODISCECTOMY N/A 01/27/2014   Procedure: CENTRAL LUMBAR LAMINECTOMY L4-5 AND L3-4;  Surgeon: Jessy Oto, MD;  Location: [place];  Service: Orthopedics;  Laterality: N/A;  . ORIF ANKLE FRACTURE Right 09/02/2015   Procedure: OPEN REDUCTION INTERNAL FIXATION (ORIF) ANKLE FRACTURE;  Surgeon: Newt Minion, MD;  Location: [place];  Service: Orthopedics;  Laterality: Right;  . PERIPHERALLY INSERTED CENTRAL CATHETER INSERTION  09/02/2015  . POLYPECTOMY    . ROTATOR CUFF REPAIR Left    x 2  . ROTATOR CUFF REPAIR Right    x 3  . SHOULDER ARTHROSCOPY W/ ROTATOR CUFF REPAIR Bilateral    "3 on the right; 1 on the left"  . SKIN SPLIT GRAFT Right 10/01/2015   Procedure: RIGHT ANKLE APPLY SKIN GRAFT SPLIT THICKNESS;  Surgeon: Newt Minion, MD;  Location: [place];   Service: Orthopedics;  Laterality: Right;  . TONSILLECTOMY    . TOOTH EXTRACTION    . TOTAL HIP ARTHROPLASTY Right 04/17/2018  . TOTAL HIP ARTHROPLASTY Right 04/17/2018   Procedure: RIGHT TOTAL HIP ARTHROPLASTY;  Surgeon: Newt Minion, MD;  Location: [place];  Service: Orthopedics;  Laterality: Right;  . TOTAL HIP ARTHROPLASTY Left 06/06/2019   Procedure: LEFT TOTAL HIP ARTHROPLASTY ANTERIOR APPROACH;  Surgeon: Mcarthur Rossetti, MD;  Location: [place];  Service: Orthopedics;  Laterality: Left;  . TOTAL HIP REVISION Left 05/24/2019  . TOTAL KNEE ARTHROPLASTY Bilateral 2008  . UMBILICAL HERNIA REPAIR     UHR  . UPPER GASTROINTESTINAL ENDOSCOPY  2016  . URETHRAL DILATION  X 4  . VASECTOMY    . WISDOM TOOTH EXTRACTION      reports that he quit smoking about 10 years ago. His smoking use included cigars. He has never used smokeless tobacco. He reports current alcohol use. He reports that he does not use drugs. family history includes Breast cancer in his mother; Colon polyps in his brother; Coronary artery disease in an other family member; Depression in his brother, mother, and another family member; Diabetes in his father; Early death in his maternal grandfather, paternal grandfather, and son; Esophageal cancer in his mother; Healthy in his son; Heart attack (age of onset: 36) in his son; Heart attack (age of onset: 4) in his maternal grandfather; Heart disease in his father, maternal grandfather, and mother; Hyperlipidemia in his father; Hypertension in his brother, father, mother, and another family member; Pancreatic cancer in his mother; Prostate cancer in his father; Skin cancer in his father; Stomach cancer in his mother. No Known Allergies Current Outpatient Medications on File Prior to Visit  Medication Sig Dispense Refill  . albuterol (VENTOLIN HFA) 108 (90 Base) MCG/ACT inhaler Inhale 2 puffs into the lungs every 6 (six) hours as needed for wheezing or shortness of breath. 18 g 1   . ARIPiprazole (ABILIFY) 10 MG tablet Take 1 tablet (10 mg total) by mouth daily. 90 tablet 0  . aspirin 81 MG chewable tablet  Chew 1 tablet (81 mg total) by mouth daily. 35 tablet 0  . atorvastatin (LIPITOR) 20 MG tablet Take 1 tablet (20 mg total) by mouth daily. (Patient taking differently: Take 20 mg by mouth daily.) 90 tablet 0  . Azelastine-Fluticasone 137-50 MCG/ACT SUSP Use both nostrils as directed twice daily 23 g 11  . benztropine (COGENTIN) 0.5 MG tablet Take 1 tablet (0.5 mg total) by mouth at bedtime. 90 tablet 0  . calcium carbonate (TUMS - DOSED IN MG ELEMENTAL CALCIUM) 500 MG chewable tablet Chew 1 tablet by mouth 3 (three) times daily with meals.    . celecoxib (CELEBREX) 200 MG capsule Take 1 capsule (200 mg total) by mouth 2 (two) times daily. 40 capsule 0  . diclofenac Sodium (VOLTAREN) 1 % GEL Apply 4 g topically 3 (three) times daily as needed (pain). 350 g 5  . DULoxetine (CYMBALTA) 30 MG capsule Take 1 capsule (30 mg total) by mouth daily. 180 capsule 0  . hydrochlorothiazide (HYDRODIURIL) 25 MG tablet Take 1 tablet (25 mg total) by mouth daily. (Patient taking differently: Take 25 mg by mouth daily.) 90 tablet 3  . HYDROcodone-acetaminophen (NORCO) 10-325 MG tablet Take 1 tablet by mouth every 6 (six) hours as needed for moderate pain. 30 tablet 0  . iron polysaccharides (NU-IRON) 150 MG capsule Take 1 capsule (150 mg total) by mouth daily. 90 capsule 1  . Melatonin 5 MG TABS Take 15 mg by mouth at bedtime.    . methocarbamol (ROBAXIN) 500 MG tablet Take 1 tablet (500 mg total) by mouth every 8 (eight) hours as needed for muscle spasms. 60 tablet 0  . MYRBETRIQ 50 MG TB24 tablet Take 50 mg by mouth at bedtime.     . Omega-3 Fatty Acids (FISH OIL) 1000 MG CAPS Take 1,000 mg by mouth at bedtime.     [name] oxybutynin (DITROPAN-XL) 10 MG 24 hr tablet Take 10 mg by mouth daily.    [name] oxymetazoline (AFRIN) 0.05 % nasal spray Place 1 spray into both nostrils 2 (two) times daily as  needed for congestion.    . pantoprazole (PROTONIX) 40 MG tablet Take 1 tablet (40 mg total) by mouth 2 (two) times daily. (Patient taking differently: Take 40 mg by mouth 2 (two) times daily before a meal.) 60 tablet 2  . phentermine 37.5 MG capsule Take 1 capsule (37.5 mg total) by mouth every morning. 30 capsule 0  . prasugrel (EFFIENT) 5 MG TABS tablet Take 1 tablet (5 mg total) by mouth daily. 90 tablet 3  . predniSONE (DELTASONE) 10 MG tablet 3 tabs by mouth per day for 3 days,2tabs per day for 3 days,1tab per day for 3 days 18 tablet 0  . pregabalin (LYRICA) 200 MG capsule Take 1 capsule (200 mg total) by mouth in the morning, at noon, and at bedtime. 60 capsule 0  . sildenafil (VIAGRA) 100 MG tablet Take 100 mg by mouth daily as needed for erectile dysfunction.     . tamsulosin (FLOMAX) 0.4 MG CAPS capsule Take 0.4 mg by mouth 2 (two) times daily.    . Testosterone 1.62 % GEL Place 1 Pump onto the skin daily. 88 g 1  . traZODone (DESYREL) 150 MG tablet Take 1 tablet (150 mg total) by mouth at bedtime. 90 tablet 0  . ZENPEP 40000-126000 units CPEP Take 2 capsules (80,000 units) with meals and 1 capsule (40,000 units) with each snack (Patient taking differently: Take 1-2 capsules by mouth See admin instructions. Take  2 capsules (80,000 units) with meals and 1 capsule (40,000 units) with each snack) 720 capsule 1  . zolpidem (AMBIEN) 10 MG tablet Take 1 tablet (10 mg total) by mouth at bedtime as needed for sleep. 90 tablet 1   No current facility-administered medications on file prior to visit.        ROS:  All others reviewed and negative.  Objective        PE:  BP (!) 152/78 (BP Location: Left Arm, Patient Position: Sitting, Cuff Size: Large)   Pulse (!) 57   Temp 98.1 F (36.7 C) (Oral)   Ht 6' (1.829 m)   Wt (!) 325 lb (147.4 kg)   SpO2 97%   BMI 44.08 kg/m                 Constitutional: Pt appears in NAD               HENT: Head: NCAT.                Right Ear: External ear  normal.                 Left Ear: External ear normal.                Eyes: . Pupils are equal, round, and reactive to light. Conjunctivae and EOM are normal               Nose: without d/c or deformity               Neck: Neck supple. Gross normal ROM               Cardiovascular: Normal rate and regular rhythm.                 Pulmonary/Chest: Effort normal and breath sounds without rales or wheezing.                Abd:  Soft, NT, ND, + BS, no organomegaly               Neurological: Pt is alert. At baseline orientation, motor grossly intact               Skin: Skin is warm. No rashes, no other new lesions, LE edema - none               Psychiatric: Pt behavior is normal without agitation   Micro: none  Cardiac tracings I have personally interpreted today:  none  Pertinent Radiological findings (summarize): none   Lab Results  Component Value Date   WBC 6.7 07/31/2020   HGB 10.7 (L) 07/31/2020   HCT 31.3 (L) 07/31/2020   PLT 247 07/31/2020   GLUCOSE 202 (H) 07/28/2020   CHOL 134 02/24/2020   TRIG 53.0 02/24/2020   HDL 59.30 02/24/2020   LDLCALC 64 02/24/2020   ALT 18 07/23/2020   AST 19 07/23/2020   NA 134 (L) 07/28/2020   K 4.3 07/28/2020   CL 104 07/28/2020   CREATININE 1.02 07/28/2020   BUN 18 07/28/2020   CO2 22 07/28/2020   TSH 2.52 02/24/2020   PSA 0.56 07/24/2019   INR 1.1 06/03/2019   HGBA1C 5.7 (A) 02/24/2020   MICROALBUR <0.7 07/24/2019   Assessment/Plan:  Travis Novak Sr. is a 70 y.o. White or Caucasian [1] male with  has a past medical history of ALLERGIC RHINITIS (06/24/2009), Allergy, Anemia, Anxiety (11/12/2011), Arthritis, Arthritis of foot, right, degenerative (04/15/2014),  Balance disorder (03/12/2013), Benign neoplasm of cecum, Benign neoplasm of descending colon, CAD (coronary artery disease) (06/24/2009), Chronic anticoagulation, Chronic pain syndrome (10/27/2009), Closed fracture of right foot (10/17/2014), CORONARY ARTERY DISEASE (06/24/2009),  DEGENERATIVE JOINT DISEASE (06/24/2009), Depression, Depression with anxiety, DISC DISEASE, LUMBAR (04/19/2010), ERECTILE DYSFUNCTION, ORGANIC (05/30/2010), Essential hypertension (06/24/2009), Fibromyalgia, Fracture dislocation of ankle joint (09/02/2015), Gait disorder (03/12/2013), General weakness (07/14/2014), GERD (gastroesophageal reflux disease) (09/08/2015), Hand joint pain (06/10/2013), Heart murmur, Hepatitis C, History of kidney stones, Hyperlipidemia (07/15/2009), HYPERLIPIDEMIA-MIXED (07/15/2009), HYPERTENSION (06/24/2009), Insomnia (10/04/2011), Irregular heart beat, Left hip pain (03/12/2013), Major depression (09/13/2015), Myocardial infarction ([place]) (2008), Non-cardiac chest pain (10/2011, 01/2013), Obesity, Occult blood positive stool (10/17/2014), Open ankle fracture (09/02/2015), OSA (obstructive sleep apnea), Pain of right thumb (04/03/2013), Pneumonia, PPD positive (04/08/2015), Pre-ulcerative corn or callous (02/06/2013), Rotator cuff tear arthropathy of both shoulders (06/10/2013), SCIATICA, LEFT (04/19/2010), Sleep apnea, Spinal stenosis in cervical region (09/26/2013), Spinal stenosis, lumbar region, with neurogenic claudication (09/26/2013), Type II diabetes mellitus ([place]) (9753), Umbilical hernia, Uncontrolled type 2 DM with peripheral circulatory disorder ([place]) (10/04/2013), and URETHRAL STRICTURE (06/24/2009).  Memory loss or impairment likley new onset MCI, not clear if he is eligible for MRI due to hx of 9 cardiac stents, for aricept 5 qd and refer neurology  Essential hypertension Uncontrolled, to add losartan 50 qd  Major depression Stable on current meds, doubt is significant element in his memory changes  Followup: Return if symptoms worsen or fail to improve.  [name], MD 10/11/2020 9:29 PM [facility]

## 2020-10-11 NOTE — Patient Instructions (Signed)
Please take all new medication as prescribed - the generic aricept for memory, and losartan 50 mg for BP  Please continue all other medications as before, including the HCT at 25 mg per day (I could not get it in the losartan pill)  Please have the pharmacy call with any other refills you may need.  Please continue your efforts at being more active, low cholesterol diet, and weight control.  You are otherwise up to date with prevention measures today.  Please keep your appointments with your specialists as you may have planned  You will be contacted regarding the referral for: Neurology, and you may need MRI

## 2020-10-13 ENCOUNTER — Ambulatory Visit (INDEPENDENT_AMBULATORY_CARE_PROVIDER_SITE_OTHER): Payer: Medicare HMO | Admitting: Family

## 2020-10-13 ENCOUNTER — Encounter: Payer: Self-pay | Admitting: Family

## 2020-10-13 ENCOUNTER — Other Ambulatory Visit: Payer: Self-pay

## 2020-10-13 DIAGNOSIS — Z89511 Acquired absence of right leg below knee: Secondary | ICD-10-CM

## 2020-10-13 NOTE — Progress Notes (Signed)
Office Visit Note   Patient: Travis FOSCO Sr.           Date of Birth: 04-24-1951           MRN: [id number] Visit Date: 10/13/2020              Requested by: [name], MD [place],  [place] [address] PCP: [name], MD  Chief Complaint  Patient presents with  . Right Leg - Follow-up    HX BKA       HPI: The patient is a 70 year old gentleman seen today for evaluation of right residual limb.  He is status post right below-knee amputation his socket liners have completely worn out he is not getting a good fit in his socket because of the liners.  No concerns for callus or ulceration no volume loss  Assessment & Plan: Visit Diagnoses: No diagnosis found.  Plan: Order for new prosthesis supplies specifically the liners given.  Also given him a permanent disabled parking placard form.  He will follow-up in the office as needed.  Follow-Up Instructions: No follow-ups on file.   Ortho Exam  Patient is alert, oriented, no adenopathy, well-dressed, normal affect, normal respiratory effort. On examination of the right residual limb this is well consolidated well-healed there is no erythema no pressure areas no impending ulceration  Imaging: No results found. No images are attached to the encounter.  Labs: Lab Results  Component Value Date   HGBA1C 5.7 (A) 02/24/2020   HGBA1C 5.9 02/24/2020   HGBA1C 5.3 07/24/2019   ESRSEDRATE 22 (H) 09/03/2018   ESRSEDRATE 35 (H) 08/15/2018   ESRSEDRATE 20 (H) 09/03/2013   CRP 7.3 09/03/2018   CRP 14.1 (H) 08/15/2018   CRP 10.7 (H) 09/03/2013   REPTSTATUS 08/16/2018 FINAL 08/15/2018   GRAMSTAIN  09/02/2013    WBC PRESENT, PREDOMINANTLY MONONUCLEAR NO ORGANISMS SEEN CYTOSPIN Performed by [facility] Performed at [place]  09/02/2013    NO ORGANISMS SEEN WBC PRESENT, PREDOMINANTLY MONONUCLEAR CYTOSPIN Gram Stain Report Called to,Read Back By and Verified With: A LEDWELL  RN 1933 09/02/13 A NAVARRO   CULT (A) 08/15/2018    <10,000 COLONIES/mL INSIGNIFICANT GROWTH Performed at [facility], [place] [address], [place], [place] [address]    LABORGA STAPHYLOCOCCUS SPECIES (COAGULASE NEGATIVE) 04/08/2015     Lab Results  Component Value Date   ALBUMIN 3.5 07/23/2020   ALBUMIN 4.3 02/24/2020   ALBUMIN 3.9 07/24/2019    Lab Results  Component Value Date   MG 1.5 (L) 08/15/2018   MG 1.9 05/03/2013   Lab Results  Component Value Date   VD25OH 29.84 (L) 02/24/2020   VD25OH 24.67 (L) 07/24/2019    No results found for: PREALBUMIN CBC EXTENDED Latest Ref Rng & Units 07/31/2020 07/30/2020 07/28/2020  WBC 4.0 - 10.5 K/uL 6.7 7.4 14.1(H)  RBC 4.22 - 5.81 MIL/uL 3.45(L) 2.96(L) 4.51  HGB 13.0 - 17.0 g/dL 10.7(L) 9.4(L) 14.2  HCT 39.0 - 52.0 % 31.3(L) 26.3(L) 39.9  PLT 150 - 400 K/uL 247 194 197  NEUTROABS 1.7 - 7.7 K/uL 5.4 5.9 -  LYMPHSABS 0.7 - 4.0 K/uL 0.6(L) 0.6(L) -     There is no height or weight on file to calculate BMI.  Orders:  No orders of the defined types were placed in this encounter.  No orders of the defined types were placed in this encounter.    Procedures: No procedures performed  Clinical Data: No additional findings.  ROS:  All other systems negative, except as noted in the HPI. Review of Systems  Constitutional: Negative for chills and fever.  Skin: Negative for color change and wound.    Objective: Vital Signs: There were no vitals taken for this visit.  Specialty Comments:  No specialty comments available.  PMFS History: Patient Active Problem List   Diagnosis Date Noted  . Memory loss or impairment 10/11/2020  . Right arm pain 09/18/2020  . Aortic atherosclerosis ([place]) 09/16/2020  . Spondylolisthesis, lumbar region   . Status post lumbar spinal fusion 07/27/2020  . Nasal sinus polyp 06/16/2020  . Vitamin D deficiency 02/24/2020  . Cough 07/29/2019  . Wheezing 07/29/2019  . Possible exposure to STD  07/24/2019  . Unilateral primary osteoarthritis, left hip 06/06/2019  . Status post total replacement of left hip 06/06/2019  . S/P laparoscopic sleeve gastrectomy 03/03/2019  . Rash 01/24/2019  . Fever 08/15/2018  . UTI (urinary tract infection) 08/15/2018  . Fall at home, initial encounter 08/15/2018  . Chronic anemia 08/15/2018  . Hypokalemia 08/15/2018  . Bowel incontinence 07/25/2018  . Other spondylosis with radiculopathy, lumbar region 07/16/2018    Class: Chronic  . Status post lumbar laminectomy 07/16/2018  . Status post THR (total hip replacement) 04/17/2018  . Unilateral primary osteoarthritis, right hip   . Morbid obesity with BMI of 40.0-44.9, adult ([place]) 02/28/2018  . Myocardial infarction ([place]) 02/25/2018  . Coronary artery disease involving native coronary artery of native heart 02/25/2018  . PAD (peripheral artery disease) ([place]) 02/25/2018  . S/P BKA (below knee amputation) unilateral, right ([place]) 02/25/2018  . Sacroiliitis ([place]) 02/25/2018  . SOB (shortness of breath) 01/03/2018  . Acute on chronic heart failure ([place]) 01/03/2018  . Severe right groin pain 12/20/2017  . Morbid obesity ([place]) 10/09/2017  . Low back pain 07/13/2017  . Leukoplakia, tongue 01/26/2017  . Skin lesion 10/20/2016  . S/P unilateral BKA (below knee amputation), right ([place]) 06/14/2016  . Charcot foot due to diabetes mellitus ([place])   . Charcot's arthropathy associated with type 2 diabetes mellitus ([place]) 04/11/2016  . Encounter for well adult exam with abnormal findings 11/05/2015  . Major depression 09/13/2015  . S/P TKR (total knee replacement) bilaterally 09/13/2015  . GERD (gastroesophageal reflux disease) 09/08/2015  . S/P laparoscopic hernia repair 05/11/2015  . PPD positive 04/08/2015  . Benign neoplasm of descending colon   . Benign neoplasm of cecum   . Acute blood loss as cause of postoperative anemia   . Chronic anticoagulation   . Occult blood positive stool 10/17/2014  .  General weakness 07/14/2014  . Urinary incontinence 07/14/2014  . Headache(784.0) 10/15/2013  . Spinal stenosis in cervical region 09/26/2013    Class: Chronic  . Congenital spinal stenosis of lumbar region 09/26/2013    Class: Chronic  . Hand joint pain 06/10/2013  . Rotator cuff tear arthropathy of both shoulders 06/10/2013  . Skin lesion of cheek 05/01/2013  . Pain of right thumb 04/03/2013  . Balance disorder 03/12/2013  . Gait disorder 03/12/2013  . Tremor 03/12/2013  . Left hip pain 03/12/2013  . Pre-ulcerative corn or callous 02/06/2013  . Anxiety 11/12/2011  . OSA on CPAP 11/07/2011  . Bradycardia 10/20/2011  . Insomnia 10/04/2011  . Obesity 01/12/2011  . Diabetes mellitus type 2 in obese ([place]) 09/27/2010  . ERECTILE DYSFUNCTION, ORGANIC 05/30/2010  . Degenerative disc disease, lumbar 04/19/2010  . SCIATICA, LEFT 04/19/2010  . Chronic pain syndrome 10/27/2009  .  Hyperlipidemia 07/15/2009  . Essential hypertension 06/24/2009  . Coronary artery disease involving native coronary artery of native heart without angina pectoris 06/24/2009  . Allergic rhinitis 06/24/2009  . URETHRAL STRICTURE 06/24/2009  . DEGENERATIVE JOINT DISEASE 06/24/2009  . SHOULDER PAIN, BILATERAL 06/24/2009  . FATIGUE 06/24/2009  . NEPHROLITHIASIS, HX OF 06/24/2009   Past Medical History:  Diagnosis Date  . ALLERGIC RHINITIS 06/24/2009  . Allergy   . Anemia    IDA  . Anxiety 11/12/2011   Adequate for discharge   . Arthritis    "all my joints" (09/30/2013)  . Arthritis of foot, right, degenerative 04/15/2014  . Balance disorder 03/12/2013  . Benign neoplasm of cecum   . Benign neoplasm of descending colon   . CAD (coronary artery disease) 06/24/2009   5 stents placed in 2007    . Chronic anticoagulation   . Chronic pain syndrome 10/27/2009   of ankle, shoulders, low back.  sciatica.   . Closed fracture of right foot 10/17/2014  . CORONARY ARTERY DISEASE 06/24/2009   a. s/p multiple PCIs - In 2008  he had a Taxus DES to the mild LAD, Endeavor DES to mid LCX and distal LCX. In January 2009 he had DES to distal LCX, mid LCX and proximal LCX. In November 2009 had BMS x 2 to the mid RCA. Cath 10/2011 with patent stents, noncardiac CP. LHC 01/2013: patent stents (noncardiac CP).  . DEGENERATIVE JOINT DISEASE 06/24/2009   Qualifier: Diagnosis of  By: [name], [name]   . Depression   . Depression with anxiety    Prior suicide attempt(08/25/19-pt states not suicide attempt)  . [place] DISEASE, LUMBAR 04/19/2010  . ERECTILE DYSFUNCTION, ORGANIC 05/30/2010  . Essential hypertension 06/24/2009   Qualifier: Diagnosis of  By: [name], [name]   . Fibromyalgia   . Fracture dislocation of ankle joint 09/02/2015  . Gait disorder 03/12/2013  . General weakness 07/14/2014  . GERD (gastroesophageal reflux disease) 09/08/2015   08/25/15-pt states was cardiac origin, not GERD  . Hand joint pain 06/10/2013  . Heart murmur   . Hepatitis C    treated pt. unknown with what he was a teenager  . History of kidney stones   . Hyperlipidemia 07/15/2009   Qualifier: Diagnosis of  By: Aundra Dubin, MD, [place]    . HYPERLIPIDEMIA-MIXED 07/15/2009   08/25/19-pt state cholesterol was normal range  . HYPERTENSION 06/24/2009  . Insomnia 10/04/2011  . Irregular heart beat   . Left hip pain 03/12/2013   Injected under ultrasound guidance on June 24, 2013   . Major depression 09/13/2015  . Myocardial infarction ([place]) 2008  . Non-cardiac chest pain 10/2011, 01/2013  . Obesity   . Occult blood positive stool 10/17/2014  . Open ankle fracture 09/02/2015  . OSA (obstructive sleep apnea)    not using CPAP (09/30/2013)  . Pain of right thumb 04/03/2013  . Pneumonia   . PPD positive 04/08/2015  . Pre-ulcerative corn or callous 02/06/2013  . Rotator cuff tear arthropathy of both shoulders 06/10/2013   History of bilateral shoulder cuff surgery for rotator cuff tears. Reports increase in pain 09/11/2015 during physical therapy of the left shoulder.    [name] SCIATICA, LEFT 04/19/2010   Qualifier: Diagnosis of  By: [name], [name]   . Sleep apnea    wears cpap 08/25/19-States not using due to nasal stuffiness.  [name] Spinal stenosis in cervical region 09/26/2013  . Spinal stenosis, lumbar region, with neurogenic claudication 09/26/2013  .  Type II diabetes mellitus ([place]) 2010/08/06   no meds in 09/2014.   [name] Umbilical hernia   . Uncontrolled type 2 DM with peripheral circulatory disorder ([place]) 10/04/2013   08/25/19- pt states A1C normal, no diabetes  . URETHRAL STRICTURE 06/24/2009   self catheterizes.     Family History  Problem Relation Age of Onset  . Depression Mother   . Heart disease Mother   . Hypertension Mother   . Breast cancer Mother        primary cancer  . Stomach cancer Mother   . Esophageal cancer Mother   . Pancreatic cancer Mother   . Diabetes Father   . Heart disease Father        CABG  . Hypertension Father   . Hyperlipidemia Father   . Prostate cancer Father   . Skin cancer Father   . Depression Brother        x 2  . Hypertension Brother        x2  . Colon polyps Brother   . Heart attack Son 31  . Early death Son   . Heart disease Maternal Grandfather   . Early death Maternal Grandfather   . Heart attack Maternal Grandfather 08/07/63  . Early death Paternal Grandfather   . Coronary artery disease Other   . Hypertension Other   . Depression Other   . Healthy Son   . Colon cancer Neg Hx   . Rectal cancer Neg Hx     Past Surgical History:  Procedure Laterality Date  . AMPUTATION Right 06/14/2016   Procedure: AMPUTATION BELOW KNEE;  Surgeon: Newt Minion, MD;  Location: [facility];  Service: Orthopedics;  Laterality: Right;  . ANKLE FUSION Right 04/15/2014   Procedure: Right Subtalar, Talonavicular Fusion;  Surgeon: Newt Minion, MD;  Location: [facility];  Service: Orthopedics;  Laterality: Right;  . ANKLE FUSION Right 04/18/2016   Procedure: Right Ankle Tibiocalcaneal Fusion;  Surgeon: Newt Minion, MD;  Location: [facility];  Service:  Orthopedics;  Laterality: Right;  . ANTERIOR CERVICAL DECOMP/DISCECTOMY FUSION N/A 09/26/2013   Procedure: ANTERIOR CERVICAL DISCECTOMY FUSION C3-4, plate and screw fixation, allograft bone graft;  Surgeon: Jessy Oto, MD;  Location: [facility];  Service: Orthopedics;  Laterality: N/A;  . BACK SURGERY     3  . BELOW KNEE LEG AMPUTATION Right 06/14/2016   right ankle and foot  . CARDIAC CATHETERIZATION  X 1  . CARPAL TUNNEL RELEASE Bilateral   . COLONOSCOPY N/A 10/22/2014   Procedure: COLONOSCOPY;  Surgeon: [place] Dragon, MD;  [facility];  Service: Endoscopy;  Laterality: N/A;  . COLONOSCOPY  11/19/2018  . CORONARY ANGIOPLASTY WITH STENT PLACEMENT     "I have 9 stents"  . ESOPHAGOGASTRODUODENOSCOPY N/A 10/19/2014   Procedure: ESOPHAGOGASTRODUODENOSCOPY (EGD);  Surgeon: Jerene Bears, MD;  Location: [facility] ENDOSCOPY;  Service: Endoscopy;  Laterality: N/A;  . FRACTURE SURGERY    . FUSION OF TALONAVICULAR JOINT Right 04/15/2014   dr duda  . GASTRIC BYPASS  02/20/2019  . HERNIA REPAIR     umbilical  . INGUINAL HERNIA REPAIR Right 05/11/2015   Procedure: LAPAROSCOPIC REPAIR RIGHT  INGUINAL HERNIA;  Surgeon: Greer Pickerel, MD;  Location: [facility];  Service: General;  Laterality: Right;  . INSERTION OF MESH Right 05/11/2015   Procedure: INSERTION OF MESH;  Surgeon: Greer Pickerel, MD;  Location: [place];  Service: General;  Laterality: Right;  . JOINT REPLACEMENT    . KNEE CARTILAGE  SURGERY Right X 12   "~ 1/2 open; ~ 1/2 scopes"  . KNEE CARTILAGE SURGERY Left X 3   "3 scopes"  . LAPAROSCOPIC GASTRIC SLEEVE RESECTION N/A 03/03/2019   Procedure: LAPAROSCOPIC GASTRIC SLEEVE RESECTION, Upper Endo, ERAS Pathway;  Surgeon: Greer Pickerel, MD;  Location: [facility];  Service: General;  Laterality: N/A;  . LEFT HEART CATHETERIZATION WITH CORONARY ANGIOGRAM N/A 02/10/2013   Procedure: LEFT HEART CATHETERIZATION WITH CORONARY ANGIOGRAM;  Surgeon: Burnell Blanks, MD;  Location: [facility] CATH LAB;  Service:  Cardiovascular;  Laterality: N/A;  . LUMBAR LAMINECTOMY N/A 07/16/2018   Procedure: LEFT L4-5 REDO PARTIAL LUMBAR HEMILAMINECTOMY WITH FORAMINOTOMY LEFT L4;  Surgeon: Jessy Oto, MD;  Location: [facility];  Service: Orthopedics;  Laterality: N/A;  . LUMBAR LAMINECTOMY/DECOMPRESSION MICRODISCECTOMY N/A 01/27/2014   Procedure: CENTRAL LUMBAR LAMINECTOMY L4-5 AND L3-4;  Surgeon: Jessy Oto, MD;  Location: [facility];  Service: Orthopedics;  Laterality: N/A;  . ORIF ANKLE FRACTURE Right 09/02/2015   Procedure: OPEN REDUCTION INTERNAL FIXATION (ORIF) ANKLE FRACTURE;  Surgeon: Newt Minion, MD;  Location: [facility];  Service: Orthopedics;  Laterality: Right;  . PERIPHERALLY INSERTED CENTRAL CATHETER INSERTION  09/02/2015  . POLYPECTOMY    . ROTATOR CUFF REPAIR Left    x 2  . ROTATOR CUFF REPAIR Right    x 3  . SHOULDER ARTHROSCOPY W/ ROTATOR CUFF REPAIR Bilateral    "3 on the right; 1 on the left"  . SKIN SPLIT GRAFT Right 10/01/2015   Procedure: RIGHT ANKLE APPLY SKIN GRAFT SPLIT THICKNESS;  Surgeon: Newt Minion, MD;  Location: [facility];  Service: Orthopedics;  Laterality: Right;  . TONSILLECTOMY    . TOOTH EXTRACTION    . TOTAL HIP ARTHROPLASTY Right 04/17/2018  . TOTAL HIP ARTHROPLASTY Right 04/17/2018   Procedure: RIGHT TOTAL HIP ARTHROPLASTY;  Surgeon: Newt Minion, MD;  Location: [facility];  Service: Orthopedics;  Laterality: Right;  . TOTAL HIP ARTHROPLASTY Left 06/06/2019   Procedure: LEFT TOTAL HIP ARTHROPLASTY ANTERIOR APPROACH;  Surgeon: Mcarthur Rossetti, MD;  Location: [facility];  Service: Orthopedics;  Laterality: Left;  . TOTAL HIP REVISION Left 05/24/2019  . TOTAL KNEE ARTHROPLASTY Bilateral 2008  . UMBILICAL HERNIA REPAIR     UHR  . UPPER GASTROINTESTINAL ENDOSCOPY  2016  . URETHRAL DILATION  X 4  . VASECTOMY    . WISDOM TOOTH EXTRACTION     Social History   Occupational History  . Occupation: disabled since 2006 due to ortho. heart, psych    Employer: UNEMPLOYED  . Occupation:  part time work as an Multimedia programmer, wrestling, and Holiday representative  Tobacco Use  . Smoking status: Former Smoker    Types: Cigars    Quit date: 08/28/2010    Years since quitting: 10.1  . Smokeless tobacco: Never Used  . Tobacco comment: 04/18/2016 "smoked 1 cigar/wk when I did smoke"  Vaping Use  . Vaping Use: Never used  Substance and Sexual Activity  . Alcohol use: Yes    Alcohol/week: 0.0 standard drinks    Comment: occasionally  . Drug use: No  . Sexual activity: Not Currently

## 2020-10-14 DIAGNOSIS — I2510 Atherosclerotic heart disease of native coronary artery without angina pectoris: Secondary | ICD-10-CM | POA: Diagnosis not present

## 2020-10-14 DIAGNOSIS — I499 Cardiac arrhythmia, unspecified: Secondary | ICD-10-CM | POA: Diagnosis not present

## 2020-10-14 DIAGNOSIS — M48062 Spinal stenosis, lumbar region with neurogenic claudication: Secondary | ICD-10-CM | POA: Diagnosis not present

## 2020-10-14 DIAGNOSIS — I509 Heart failure, unspecified: Secondary | ICD-10-CM | POA: Diagnosis not present

## 2020-10-14 DIAGNOSIS — M797 Fibromyalgia: Secondary | ICD-10-CM | POA: Diagnosis not present

## 2020-10-14 DIAGNOSIS — I252 Old myocardial infarction: Secondary | ICD-10-CM | POA: Diagnosis not present

## 2020-10-14 DIAGNOSIS — I110 Hypertensive heart disease with heart failure: Secondary | ICD-10-CM | POA: Diagnosis not present

## 2020-10-14 DIAGNOSIS — M4802 Spinal stenosis, cervical region: Secondary | ICD-10-CM | POA: Diagnosis not present

## 2020-10-14 DIAGNOSIS — Z4789 Encounter for other orthopedic aftercare: Secondary | ICD-10-CM | POA: Diagnosis not present

## 2020-10-20 ENCOUNTER — Ambulatory Visit (INDEPENDENT_AMBULATORY_CARE_PROVIDER_SITE_OTHER): Payer: Medicare Other | Admitting: Psychology

## 2020-10-20 DIAGNOSIS — M48061 Spinal stenosis, lumbar region without neurogenic claudication: Secondary | ICD-10-CM | POA: Diagnosis not present

## 2020-10-20 DIAGNOSIS — F331 Major depressive disorder, recurrent, moderate: Secondary | ICD-10-CM

## 2020-10-20 DIAGNOSIS — M4726 Other spondylosis with radiculopathy, lumbar region: Secondary | ICD-10-CM | POA: Diagnosis not present

## 2020-10-20 DIAGNOSIS — M4326 Fusion of spine, lumbar region: Secondary | ICD-10-CM | POA: Diagnosis not present

## 2020-10-22 ENCOUNTER — Other Ambulatory Visit: Payer: Self-pay | Admitting: Internal Medicine

## 2020-10-22 DIAGNOSIS — M4802 Spinal stenosis, cervical region: Secondary | ICD-10-CM | POA: Diagnosis not present

## 2020-10-22 DIAGNOSIS — G4730 Sleep apnea, unspecified: Secondary | ICD-10-CM | POA: Diagnosis not present

## 2020-10-22 DIAGNOSIS — N35919 Unspecified urethral stricture, male, unspecified site: Secondary | ICD-10-CM | POA: Diagnosis not present

## 2020-10-22 DIAGNOSIS — I2510 Atherosclerotic heart disease of native coronary artery without angina pectoris: Secondary | ICD-10-CM | POA: Diagnosis not present

## 2020-10-22 DIAGNOSIS — M797 Fibromyalgia: Secondary | ICD-10-CM | POA: Diagnosis not present

## 2020-10-22 DIAGNOSIS — M48062 Spinal stenosis, lumbar region with neurogenic claudication: Secondary | ICD-10-CM | POA: Diagnosis not present

## 2020-10-22 DIAGNOSIS — D62 Acute posthemorrhagic anemia: Secondary | ICD-10-CM | POA: Diagnosis not present

## 2020-10-22 DIAGNOSIS — D124 Benign neoplasm of descending colon: Secondary | ICD-10-CM | POA: Diagnosis not present

## 2020-10-22 DIAGNOSIS — Z4789 Encounter for other orthopedic aftercare: Secondary | ICD-10-CM | POA: Diagnosis not present

## 2020-10-22 DIAGNOSIS — G4700 Insomnia, unspecified: Secondary | ICD-10-CM | POA: Diagnosis not present

## 2020-10-22 DIAGNOSIS — E1151 Type 2 diabetes mellitus with diabetic peripheral angiopathy without gangrene: Secondary | ICD-10-CM | POA: Diagnosis not present

## 2020-10-22 DIAGNOSIS — I110 Hypertensive heart disease with heart failure: Secondary | ICD-10-CM | POA: Diagnosis not present

## 2020-10-22 DIAGNOSIS — N200 Calculus of kidney: Secondary | ICD-10-CM | POA: Diagnosis not present

## 2020-10-22 DIAGNOSIS — G894 Chronic pain syndrome: Secondary | ICD-10-CM | POA: Diagnosis not present

## 2020-10-22 DIAGNOSIS — K219 Gastro-esophageal reflux disease without esophagitis: Secondary | ICD-10-CM | POA: Diagnosis not present

## 2020-10-22 DIAGNOSIS — E782 Mixed hyperlipidemia: Secondary | ICD-10-CM | POA: Diagnosis not present

## 2020-10-22 DIAGNOSIS — K429 Umbilical hernia without obstruction or gangrene: Secondary | ICD-10-CM | POA: Diagnosis not present

## 2020-10-22 DIAGNOSIS — I499 Cardiac arrhythmia, unspecified: Secondary | ICD-10-CM | POA: Diagnosis not present

## 2020-10-22 DIAGNOSIS — I509 Heart failure, unspecified: Secondary | ICD-10-CM | POA: Diagnosis not present

## 2020-10-22 DIAGNOSIS — I252 Old myocardial infarction: Secondary | ICD-10-CM | POA: Diagnosis not present

## 2020-10-22 DIAGNOSIS — D120 Benign neoplasm of cecum: Secondary | ICD-10-CM | POA: Diagnosis not present

## 2020-10-22 DIAGNOSIS — K5900 Constipation, unspecified: Secondary | ICD-10-CM | POA: Diagnosis not present

## 2020-10-25 DIAGNOSIS — D124 Benign neoplasm of descending colon: Secondary | ICD-10-CM | POA: Diagnosis not present

## 2020-10-25 DIAGNOSIS — Z4789 Encounter for other orthopedic aftercare: Secondary | ICD-10-CM | POA: Diagnosis not present

## 2020-10-25 DIAGNOSIS — E782 Mixed hyperlipidemia: Secondary | ICD-10-CM | POA: Diagnosis not present

## 2020-10-25 DIAGNOSIS — K5900 Constipation, unspecified: Secondary | ICD-10-CM | POA: Diagnosis not present

## 2020-10-25 DIAGNOSIS — I110 Hypertensive heart disease with heart failure: Secondary | ICD-10-CM | POA: Diagnosis not present

## 2020-10-25 DIAGNOSIS — N200 Calculus of kidney: Secondary | ICD-10-CM | POA: Diagnosis not present

## 2020-10-25 DIAGNOSIS — M48062 Spinal stenosis, lumbar region with neurogenic claudication: Secondary | ICD-10-CM | POA: Diagnosis not present

## 2020-10-25 DIAGNOSIS — D62 Acute posthemorrhagic anemia: Secondary | ICD-10-CM | POA: Diagnosis not present

## 2020-10-25 DIAGNOSIS — I499 Cardiac arrhythmia, unspecified: Secondary | ICD-10-CM | POA: Diagnosis not present

## 2020-10-25 DIAGNOSIS — I509 Heart failure, unspecified: Secondary | ICD-10-CM | POA: Diagnosis not present

## 2020-10-25 DIAGNOSIS — G894 Chronic pain syndrome: Secondary | ICD-10-CM | POA: Diagnosis not present

## 2020-10-25 DIAGNOSIS — E1151 Type 2 diabetes mellitus with diabetic peripheral angiopathy without gangrene: Secondary | ICD-10-CM | POA: Diagnosis not present

## 2020-10-25 DIAGNOSIS — I2510 Atherosclerotic heart disease of native coronary artery without angina pectoris: Secondary | ICD-10-CM | POA: Diagnosis not present

## 2020-10-25 DIAGNOSIS — K219 Gastro-esophageal reflux disease without esophagitis: Secondary | ICD-10-CM | POA: Diagnosis not present

## 2020-10-25 DIAGNOSIS — G4730 Sleep apnea, unspecified: Secondary | ICD-10-CM | POA: Diagnosis not present

## 2020-10-25 DIAGNOSIS — D120 Benign neoplasm of cecum: Secondary | ICD-10-CM | POA: Diagnosis not present

## 2020-10-25 DIAGNOSIS — I252 Old myocardial infarction: Secondary | ICD-10-CM | POA: Diagnosis not present

## 2020-10-25 DIAGNOSIS — M4802 Spinal stenosis, cervical region: Secondary | ICD-10-CM | POA: Diagnosis not present

## 2020-10-25 DIAGNOSIS — G4700 Insomnia, unspecified: Secondary | ICD-10-CM | POA: Diagnosis not present

## 2020-10-25 DIAGNOSIS — M797 Fibromyalgia: Secondary | ICD-10-CM | POA: Diagnosis not present

## 2020-10-25 DIAGNOSIS — K429 Umbilical hernia without obstruction or gangrene: Secondary | ICD-10-CM | POA: Diagnosis not present

## 2020-10-25 DIAGNOSIS — N35919 Unspecified urethral stricture, male, unspecified site: Secondary | ICD-10-CM | POA: Diagnosis not present

## 2020-10-26 DIAGNOSIS — M4326 Fusion of spine, lumbar region: Secondary | ICD-10-CM | POA: Diagnosis not present

## 2020-10-26 DIAGNOSIS — M4726 Other spondylosis with radiculopathy, lumbar region: Secondary | ICD-10-CM | POA: Diagnosis not present

## 2020-10-26 DIAGNOSIS — M48061 Spinal stenosis, lumbar region without neurogenic claudication: Secondary | ICD-10-CM | POA: Diagnosis not present

## 2020-10-27 ENCOUNTER — Ambulatory Visit (INDEPENDENT_AMBULATORY_CARE_PROVIDER_SITE_OTHER): Payer: Medicare Other

## 2020-10-27 ENCOUNTER — Ambulatory Visit (INDEPENDENT_AMBULATORY_CARE_PROVIDER_SITE_OTHER): Payer: Medicare Other | Admitting: Specialist

## 2020-10-27 ENCOUNTER — Other Ambulatory Visit: Payer: Self-pay

## 2020-10-27 ENCOUNTER — Encounter: Payer: Self-pay | Admitting: Specialist

## 2020-10-27 VITALS — BP 150/77 | HR 54 | Ht 72.0 in | Wt 318.0 lb

## 2020-10-27 DIAGNOSIS — J339 Nasal polyp, unspecified: Secondary | ICD-10-CM | POA: Diagnosis not present

## 2020-10-27 DIAGNOSIS — Z981 Arthrodesis status: Secondary | ICD-10-CM | POA: Diagnosis not present

## 2020-10-27 DIAGNOSIS — Z89511 Acquired absence of right leg below knee: Secondary | ICD-10-CM

## 2020-10-27 DIAGNOSIS — M2170 Unequal limb length (acquired), unspecified site: Secondary | ICD-10-CM

## 2020-10-27 NOTE — Progress Notes (Signed)
Post-Op Visit Note   Patient: Travis MELLINGER Sr.           Date of Birth: 02/01/1951           MRN: [id number] Visit Date: 10/27/2020 PCP: [name], MD   Assessment & Plan: 3 month post op lumbar surgery L1 to S1 fusion for DDD and severe spondylosis with nerve compression within the right L1-2 and left L2-3, L3-4 and L5-S1 foramen  Chief Complaint:  Chief Complaint  Patient presents with  . Lower Back - Follow-up   Visit Diagnoses:  1. S/P lumbar spinal fusion   Lumbar incision is healed. Legs motor is normal with right BK. radiographs  Plan:  Call if there is increasing drainage, fever greater than 101.5, severe head aches, and worsening nausea or light sensitivity. If shortness of breath, bloody cough or chest tightness or pain go to an emergency room. No lifting greater than 10 lbs. Avoid bending, stooping and twisting. Use brace when sitting and out of bed even to go to bathroomfor 1/2 days till seen back in 6 weeks. g. Please call and return for scheduled follow up appointment 2 weeks from the time of surgery.    Follow-Up Instructions: No follow-ups on file.   Orders:  Orders Placed This Encounter  Procedures  . XR Lumbar Spine 2-3 Views   No orders of the defined types were placed in this encounter.   Imaging: XR Lumbar Spine 2-3 Views  Result Date: 10/27/2020 AP and lateral flexion and extension radiographs demonstrate about 3 mm of motion across the L1-S1 fusion site but there is assymetry of the screws that suggests that some of the difference is likely related to some position change due to right leg being shorter than the left.    PMFS History: Patient Active Problem List   Diagnosis Date Noted  . Other spondylosis with radiculopathy, lumbar region 07/16/2018    Priority: High    Class: Chronic  . Spinal stenosis in cervical region 09/26/2013    Priority: High    Class: Chronic  . Congenital spinal stenosis of lumbar region 09/26/2013     Priority: High    Class: Chronic  . Memory loss or impairment 10/11/2020  . Right arm pain 09/18/2020  . Aortic atherosclerosis ([place]) 09/16/2020  . Spondylolisthesis, lumbar region   . Status post lumbar spinal fusion 07/27/2020  . Nasal sinus polyp 06/16/2020  . Vitamin D deficiency 02/24/2020  . Cough 07/29/2019  . Wheezing 07/29/2019  . Possible exposure to STD 07/24/2019  . Unilateral primary osteoarthritis, left hip 06/06/2019  . Status post total replacement of left hip 06/06/2019  . S/P laparoscopic sleeve gastrectomy 03/03/2019  . Rash 01/24/2019  . Fever 08/15/2018  . UTI (urinary tract infection) 08/15/2018  . Fall at home, initial encounter 08/15/2018  . Chronic anemia 08/15/2018  . Hypokalemia 08/15/2018  . Bowel incontinence 07/25/2018  . Status post lumbar laminectomy 07/16/2018  . Status post THR (total hip replacement) 04/17/2018  . Unilateral primary osteoarthritis, right hip   . Morbid obesity with BMI of 40.0-44.9, adult ([place]) 02/28/2018  . Myocardial infarction ([place]) 02/25/2018  . Coronary artery disease involving native coronary artery of native heart 02/25/2018  . PAD (peripheral artery disease) ([place]) 02/25/2018  . S/P BKA (below knee amputation) unilateral, right ([place]) 02/25/2018  . Sacroiliitis ([place]) 02/25/2018  . SOB (shortness of breath) 01/03/2018  . Acute on chronic heart failure ([place]) 01/03/2018  . Severe right groin pain 12/20/2017  .  Morbid obesity ([place]) 10/09/2017  . Low back pain 07/13/2017  . Leukoplakia, tongue 01/26/2017  . Skin lesion 10/20/2016  . S/P unilateral BKA (below knee amputation), right ([place]) 06/14/2016  . Charcot foot due to diabetes mellitus ([place])   . Charcot's arthropathy associated with type 2 diabetes mellitus ([place]) 04/11/2016  . Encounter for well adult exam with abnormal findings 11/05/2015  . Major depression 09/13/2015  . S/P TKR (total knee replacement) bilaterally 09/13/2015  . GERD (gastroesophageal reflux  disease) 09/08/2015  . S/P laparoscopic hernia repair 05/11/2015  . PPD positive 04/08/2015  . Benign neoplasm of descending colon   . Benign neoplasm of cecum   . Acute blood loss as cause of postoperative anemia   . Chronic anticoagulation   . Occult blood positive stool 10/17/2014  . General weakness 07/14/2014  . Urinary incontinence 07/14/2014  . Headache(784.0) 10/15/2013  . Hand joint pain 06/10/2013  . Rotator cuff tear arthropathy of both shoulders 06/10/2013  . Skin lesion of cheek 05/01/2013  . Pain of right thumb 04/03/2013  . Balance disorder 03/12/2013  . Gait disorder 03/12/2013  . Tremor 03/12/2013  . Left hip pain 03/12/2013  . Pre-ulcerative corn or callous 02/06/2013  . Anxiety 11/12/2011  . OSA on CPAP 11/07/2011  . Bradycardia 10/20/2011  . Insomnia 10/04/2011  . Obesity 01/12/2011  . Diabetes mellitus type 2 in obese ([place]) 09/27/2010  . ERECTILE DYSFUNCTION, ORGANIC 05/30/2010  . Degenerative disc disease, lumbar 04/19/2010  . SCIATICA, LEFT 04/19/2010  . Chronic pain syndrome 10/27/2009  . Hyperlipidemia 07/15/2009  . Essential hypertension 06/24/2009  . Coronary artery disease involving native coronary artery of native heart without angina pectoris 06/24/2009  . Allergic rhinitis 06/24/2009  . URETHRAL STRICTURE 06/24/2009  . DEGENERATIVE JOINT DISEASE 06/24/2009  . SHOULDER PAIN, BILATERAL 06/24/2009  . FATIGUE 06/24/2009  . NEPHROLITHIASIS, HX OF 06/24/2009   Past Medical History:  Diagnosis Date  . ALLERGIC RHINITIS 06/24/2009  . Allergy   . Anemia    IDA  . Anxiety 11/12/2011   Adequate for discharge   . Arthritis    "all my joints" (09/30/2013)  . Arthritis of foot, right, degenerative 04/15/2014  . Balance disorder 03/12/2013  . Benign neoplasm of cecum   . Benign neoplasm of descending colon   . CAD (coronary artery disease) 06/24/2009   5 stents placed in 2007    . Chronic anticoagulation   . Chronic pain syndrome 10/27/2009   of  ankle, shoulders, low back.  sciatica.   . Closed fracture of right foot 10/17/2014  . CORONARY ARTERY DISEASE 06/24/2009   a. s/p multiple PCIs - In 2008 he had a Taxus DES to the mild LAD, Endeavor DES to mid LCX and distal LCX. In January 2009 he had DES to distal LCX, mid LCX and proximal LCX. In November 2009 had BMS x 2 to the mid RCA. Cath 10/2011 with patent stents, noncardiac CP. LHC 01/2013: patent stents (noncardiac CP).  . DEGENERATIVE JOINT DISEASE 06/24/2009   Qualifier: Diagnosis of  By: [name], [name]   . Depression   . Depression with anxiety    Prior suicide attempt(08/25/19-pt states not suicide attempt)  . [place] DISEASE, LUMBAR 04/19/2010  . ERECTILE DYSFUNCTION, ORGANIC 05/30/2010  . Essential hypertension 06/24/2009   Qualifier: Diagnosis of  By: [name], [name]   . Fibromyalgia   . Fracture dislocation of ankle joint 09/02/2015  . Gait disorder 03/12/2013  . General weakness 07/14/2014  . GERD (gastroesophageal  reflux disease) 09/08/2015   08/25/15-pt states was cardiac origin, not GERD  . Hand joint pain 06/10/2013  . Heart murmur   . Hepatitis C    treated pt. unknown with what he was a teenager  . History of kidney stones   . Hyperlipidemia 07/15/2009   Qualifier: Diagnosis of  By: Aundra Dubin, MD, [place]    . HYPERLIPIDEMIA-MIXED 07/15/2009   08/25/19-pt state cholesterol was normal range  . HYPERTENSION 06/24/2009  . Insomnia 10/04/2011  . Irregular heart beat   . Left hip pain 03/12/2013   Injected under ultrasound guidance on June 24, 2013   . Major depression 09/13/2015  . Myocardial infarction ([place]) 07-Aug-2006  . Non-cardiac chest pain 10/2011, 01/2013  . Obesity   . Occult blood positive stool 10/17/2014  . Open ankle fracture 09/02/2015  . OSA (obstructive sleep apnea)    not using CPAP (09/30/2013)  . Pain of right thumb 04/03/2013  . Pneumonia   . PPD positive 04/08/2015  . Pre-ulcerative corn or callous 02/06/2013  . Rotator cuff tear arthropathy of both shoulders 06/10/2013    History of bilateral shoulder cuff surgery for rotator cuff tears. Reports increase in pain 09/11/2015 during physical therapy of the left shoulder.   [name] SCIATICA, LEFT 04/19/2010   Qualifier: Diagnosis of  By: [name], [name]   . Sleep apnea    wears cpap 08/25/19-States not using due to nasal stuffiness.  [name] Spinal stenosis in cervical region 09/26/2013  . Spinal stenosis, lumbar region, with neurogenic claudication 09/26/2013  . Type II diabetes mellitus ([place]) 07-Aug-2010   no meds in 09/2014.   [name] Umbilical hernia   . Uncontrolled type 2 DM with peripheral circulatory disorder ([place]) 10/04/2013   08/25/19- pt states A1C normal, no diabetes  . URETHRAL STRICTURE 06/24/2009   self catheterizes.     Family History  Problem Relation Age of Onset  . Depression Mother   . Heart disease Mother   . Hypertension Mother   . Breast cancer Mother        primary cancer  . Stomach cancer Mother   . Esophageal cancer Mother   . Pancreatic cancer Mother   . Diabetes Father   . Heart disease Father        CABG  . Hypertension Father   . Hyperlipidemia Father   . Prostate cancer Father   . Skin cancer Father   . Depression Brother        x 2  . Hypertension Brother        x2  . Colon polyps Brother   . Heart attack Son 69  . Early death Son   . Heart disease Maternal Grandfather   . Early death Maternal Grandfather   . Heart attack Maternal Grandfather 2063/08/08  . Early death Paternal Grandfather   . Coronary artery disease Other   . Hypertension Other   . Depression Other   . Healthy Son   . Colon cancer Neg Hx   . Rectal cancer Neg Hx     Past Surgical History:  Procedure Laterality Date  . AMPUTATION Right 06/14/2016   Procedure: AMPUTATION BELOW KNEE;  Surgeon: Newt Minion, MD;  Location: [facility];  Service: Orthopedics;  Laterality: Right;  . ANKLE FUSION Right 04/15/2014   Procedure: Right Subtalar, Talonavicular Fusion;  Surgeon: Newt Minion, MD;  Location: [facility];  Service: Orthopedics;   Laterality: Right;  . ANKLE FUSION Right 04/18/2016   Procedure: Right Ankle Tibiocalcaneal Fusion;  Surgeon: Newt Minion, MD;  Location: [facility];  Service: Orthopedics;  Laterality: Right;  . ANTERIOR CERVICAL DECOMP/DISCECTOMY FUSION N/A 09/26/2013   Procedure: ANTERIOR CERVICAL DISCECTOMY FUSION C3-4, plate and screw fixation, allograft bone graft;  Surgeon: Jessy Oto, MD;  Location: [facility];  Service: Orthopedics;  Laterality: N/A;  . BACK SURGERY     3  . BELOW KNEE LEG AMPUTATION Right 06/14/2016   right ankle and foot  . CARDIAC CATHETERIZATION  X 1  . CARPAL TUNNEL RELEASE Bilateral   . COLONOSCOPY N/A 10/22/2014   Procedure: COLONOSCOPY;  Surgeon: Lafayette Dragon, MD;  Location[facility] ENDOSCOPY;  Service: Endoscopy;  Laterality: N/A;  . COLONOSCOPY  11/19/2018  . CORONARY ANGIOPLASTY WITH STENT PLACEMENT     "I have 9 stents"  . ESOPHAGOGASTRODUODENOSCOPY N/A 10/19/2014   Procedure: ESOPHAGOGASTRODUODENOSCOPY (EGD);  Surgeon: Jerene Bears, MD;  [facility];  Service: Endoscopy;  Laterality: N/A;  . FRACTURE SURGERY    . FUSION OF TALONAVICULAR JOINT Right 04/15/2014   dr duda  . GASTRIC BYPASS  02/20/2019  . HERNIA REPAIR     umbilical  . INGUINAL HERNIA REPAIR Right 05/11/2015   Procedure: LAPAROSCOPIC REPAIR RIGHT  INGUINAL HERNIA;  Surgeon: Greer Pickerel, MD;  Location: [place];  Service: General;  Laterality: Right;  . INSERTION OF MESH Right 05/11/2015   Procedure: INSERTION OF MESH;  Surgeon: Greer Pickerel, MD;  Location: [place];  Service: General;  Laterality: Right;  . JOINT REPLACEMENT    . KNEE CARTILAGE SURGERY Right X 12   "~ 1/2 open; ~ 1/2 scopes"  . KNEE CARTILAGE SURGERY Left X 3   "3 scopes"  . LAPAROSCOPIC GASTRIC SLEEVE RESECTION N/A 03/03/2019   Procedure: LAPAROSCOPIC GASTRIC SLEEVE RESECTION, Upper Endo, ERAS Pathway;  Surgeon: Greer Pickerel, MD;  Location: [facility];  Service: General;  Laterality: N/A;  . LEFT HEART CATHETERIZATION WITH CORONARY ANGIOGRAM  N/A 02/10/2013   Procedure: LEFT HEART CATHETERIZATION WITH CORONARY ANGIOGRAM;  Surgeon: Burnell Blanks, MD;  Location: [facility] CATH LAB;  Service: Cardiovascular;  Laterality: N/A;  . LUMBAR LAMINECTOMY N/A 07/16/2018   Procedure: LEFT L4-5 REDO PARTIAL LUMBAR HEMILAMINECTOMY WITH FORAMINOTOMY LEFT L4;  Surgeon: Jessy Oto, MD;  Location: [place];  Service: Orthopedics;  Laterality: N/A;  . LUMBAR LAMINECTOMY/DECOMPRESSION MICRODISCECTOMY N/A 01/27/2014   Procedure: CENTRAL LUMBAR LAMINECTOMY L4-5 AND L3-4;  Surgeon: Jessy Oto, MD;  Location: [place];  Service: Orthopedics;  Laterality: N/A;  . ORIF ANKLE FRACTURE Right 09/02/2015   Procedure: OPEN REDUCTION INTERNAL FIXATION (ORIF) ANKLE FRACTURE;  Surgeon: Newt Minion, MD;  Location: [place];  Service: Orthopedics;  Laterality: Right;  . PERIPHERALLY INSERTED CENTRAL CATHETER INSERTION  09/02/2015  . POLYPECTOMY    . ROTATOR CUFF REPAIR Left    x 2  . ROTATOR CUFF REPAIR Right    x 3  . SHOULDER ARTHROSCOPY W/ ROTATOR CUFF REPAIR Bilateral    "3 on the right; 1 on the left"  . SKIN SPLIT GRAFT Right 10/01/2015   Procedure: RIGHT ANKLE APPLY SKIN GRAFT SPLIT THICKNESS;  Surgeon: Newt Minion, MD;  Location: [place];  Service: Orthopedics;  Laterality: Right;  . TONSILLECTOMY    . TOOTH EXTRACTION    . TOTAL HIP ARTHROPLASTY Right 04/17/2018  . TOTAL HIP ARTHROPLASTY Right 04/17/2018   Procedure: RIGHT TOTAL HIP ARTHROPLASTY;  Surgeon: Newt Minion, MD;  Location: [place];  Service: Orthopedics;  Laterality: Right;  . TOTAL HIP  ARTHROPLASTY Left 06/06/2019   Procedure: LEFT TOTAL HIP ARTHROPLASTY ANTERIOR APPROACH;  Surgeon: Mcarthur Rossetti, MD;  Location: [place];  Service: Orthopedics;  Laterality: Left;  . TOTAL HIP REVISION Left 05/24/2019  . TOTAL KNEE ARTHROPLASTY Bilateral 2008  . UMBILICAL HERNIA REPAIR     UHR  . UPPER GASTROINTESTINAL ENDOSCOPY  2016  . URETHRAL DILATION  X 4  . VASECTOMY    . WISDOM TOOTH EXTRACTION      Social History   Occupational History  . Occupation: disabled since 2006 due to ortho. heart, psych    Employer: UNEMPLOYED  . Occupation: part time work as an Multimedia programmer, wrestling, and Holiday representative  Tobacco Use  . Smoking status: Former Smoker    Types: Cigars    Quit date: 08/28/2010    Years since quitting: 10.1  . Smokeless tobacco: Never Used  . Tobacco comment: 04/18/2016 "smoked 1 cigar/wk when I did smoke"  Vaping Use  . Vaping Use: Never used  Substance and Sexual Activity  . Alcohol use: Yes    Alcohol/week: 0.0 standard drinks    Comment: occasionally  . Drug use: No  . Sexual activity: Not Currently

## 2020-10-27 NOTE — Patient Instructions (Signed)
Plan:  Call if there is increasing drainage, fever greater than 101.5, severe head aches, and worsening nausea or light sensitivity. If shortness of breath, bloody cough or chest tightness or pain go to an emergency room. No lifting greater than 10 lbs. Avoid bending, stooping and twisting. Use brace when sitting and out of bed even to go to bathroomfor 1/2 days till seen back in 6 weeks. g. Please call and return for scheduled follow up appointment 2 weeks from the time of surgery.

## 2020-10-29 DIAGNOSIS — Z89511 Acquired absence of right leg below knee: Secondary | ICD-10-CM | POA: Diagnosis not present

## 2020-11-02 ENCOUNTER — Telehealth (INDEPENDENT_AMBULATORY_CARE_PROVIDER_SITE_OTHER): Payer: Medicare Other | Admitting: Internal Medicine

## 2020-11-02 ENCOUNTER — Encounter: Payer: Self-pay | Admitting: Internal Medicine

## 2020-11-02 DIAGNOSIS — F419 Anxiety disorder, unspecified: Secondary | ICD-10-CM | POA: Diagnosis not present

## 2020-11-02 DIAGNOSIS — J329 Chronic sinusitis, unspecified: Secondary | ICD-10-CM | POA: Diagnosis not present

## 2020-11-02 DIAGNOSIS — J309 Allergic rhinitis, unspecified: Secondary | ICD-10-CM

## 2020-11-02 MED ORDER — PHENTERMINE HCL 37.5 MG PO CAPS: 37.5000 mg | ORAL_CAPSULE | ORAL | 0 refills | Status: DC

## 2020-11-02 MED ORDER — HYDROCODONE BIT-HOMATROP MBR 5-1.5 MG/5ML PO SOLN: 5.0000 mL | Freq: Four times a day (QID) | ORAL | 0 refills | Status: DC | PRN

## 2020-11-02 MED ORDER — PREDNISONE 10 MG PO TABS: ORAL_TABLET | ORAL | 0 refills | Status: DC

## 2020-11-02 MED ORDER — LEVOFLOXACIN 500 MG PO TABS: 500.0000 mg | ORAL_TABLET | Freq: Every day | ORAL | 0 refills | Status: AC

## 2020-11-02 NOTE — Assessment & Plan Note (Signed)
Mild to mod, for antibx course,  to f/u any worsening symptoms or concerns 

## 2020-11-02 NOTE — Assessment & Plan Note (Signed)
Mild situational, d/w pt, decliens need for speicific change in tx at this itme, to f/u with psychiatry as planned

## 2020-11-02 NOTE — Assessment & Plan Note (Signed)
/  Mild to mod, for predpac asd,  to f/u any worsening symptoms or concerns 

## 2020-11-02 NOTE — Assessment & Plan Note (Signed)
For phentermine asd, to f/u any worsening symptoms or concerns

## 2020-11-02 NOTE — Patient Instructions (Signed)
Please take all new medication as prescribed 

## 2020-11-02 NOTE — Progress Notes (Signed)
Patient ID: Travis Dyer., male   DOB: [birth year], 70 y.o.   MRN: [id number]  Virtual Visit via Video Note  I connected with Travis Novak Sr. on 11/02/20 at  1:20 PM EDT by a video enabled telemedicine application and verified that I am speaking with the correct person using two identifiers.  Location of all participants today Patient: at home Provider: at office   I discussed the limitations of evaluation and management by telemedicine and the availability of in person appointments. The patient expressed understanding and agreed to proceed.  History of Present Illness:  Here with 2-3 days acute onset fever, facial pain, pressure, headache, general weakness and malaise, and greenish d/c, with mild ST and cough, but pt denies chest pain, wheezing, increased sob or doe, orthopnea, PND, increased LE swelling, palpitations, dizziness or syncope.  Does have several wks ongoing nasal allergy symptoms with clearish congestion, itch and sneezing, without fever, pain, ST, cough, swelling or wheezing.  Has ongoing obesity, asks for refill phentermine. Denies worsening depressive symptoms, suicidal ideation, or panic; has ongoing anxiety, follow closely with psychiatry Past Medical History:  Diagnosis Date   ALLERGIC RHINITIS 06/24/2009   Allergy    Anemia    IDA   Anxiety 11/12/2011   Adequate for discharge    Arthritis    "all my joints" (09/30/2013)   Arthritis of foot, right, degenerative 04/15/2014   Balance disorder 03/12/2013   Benign neoplasm of cecum    Benign neoplasm of descending colon    CAD (coronary artery disease) 06/24/2009   5 stents placed in 2007     Chronic anticoagulation    Chronic pain syndrome 10/27/2009   of ankle, shoulders, low back.  sciatica.    Closed fracture of right foot 10/17/2014   CORONARY ARTERY DISEASE 06/24/2009   a. s/p multiple PCIs - In 2008 he had a Taxus DES to the mild LAD, Endeavor DES to mid LCX and distal LCX. In January 2009 he had DES to  distal LCX, mid LCX and proximal LCX. In November 2009 had BMS x 2 to the mid RCA. Cath 10/2011 with patent stents, noncardiac CP. LHC 01/2013: patent stents (noncardiac CP).   DEGENERATIVE JOINT DISEASE 06/24/2009   Qualifier: Diagnosis of  By: [name], [name]    Depression    Depression with anxiety    Prior suicide attempt(08/25/19-pt states not suicide attempt)   [place] DISEASE, LUMBAR 04/19/2010   ERECTILE DYSFUNCTION, ORGANIC 05/30/2010   Essential hypertension 06/24/2009   Qualifier: Diagnosis of  By: [name], [name]    Fibromyalgia    Fracture dislocation of ankle joint 09/02/2015   Gait disorder 03/12/2013   General weakness 07/14/2014   GERD (gastroesophageal reflux disease) 09/08/2015   08/25/15-pt states was cardiac origin, not GERD   Hand joint pain 06/10/2013   Heart murmur    Hepatitis C    treated pt. unknown with what he was a teenager   History of kidney stones    Hyperlipidemia 07/15/2009   Qualifier: Diagnosis of  By: Aundra Dubin, MD, [place]     HYPERLIPIDEMIA-MIXED 07/15/2009   08/25/19-pt state cholesterol was normal range   HYPERTENSION 06/24/2009   Insomnia 10/04/2011   Irregular heart beat    Left hip pain 03/12/2013   Injected under ultrasound guidance on June 24, 2013    Major depression 09/13/2015   Myocardial infarction [facility]) 2008   Non-cardiac chest pain 10/2011, 01/2013   Obesity    Occult blood positive  stool 10/17/2014   Open ankle fracture 09/02/2015   OSA (obstructive sleep apnea)    not using CPAP (09/30/2013)   Pain of right thumb 04/03/2013   Pneumonia    PPD positive 04/08/2015   Pre-ulcerative corn or callous 02/06/2013   Rotator cuff tear arthropathy of both shoulders 06/10/2013   History of bilateral shoulder cuff surgery for rotator cuff tears. Reports increase in pain 09/11/2015 during physical therapy of the left shoulder.    SCIATICA, LEFT 04/19/2010   Qualifier: Diagnosis of  By: [name], [name]    Sleep apnea    wears cpap 08/25/19-States not using due to  nasal stuffiness.   Spinal stenosis in cervical region 09/26/2013   Spinal stenosis, lumbar region, with neurogenic claudication 09/26/2013   Type II diabetes mellitus ([place]) 2012   no meds in 09/2014.    Umbilical hernia    Uncontrolled type 2 DM with peripheral circulatory disorder ([place]) 10/04/2013   08/25/19- pt states A1C normal, no diabetes   URETHRAL STRICTURE 06/24/2009   self catheterizes.    Past Surgical History:  Procedure Laterality Date   AMPUTATION Right 06/14/2016   Procedure: AMPUTATION BELOW KNEE;  Surgeon: Newt Minion, MD;  Location: [place];  Service: Orthopedics;  Laterality: Right;   ANKLE FUSION Right 04/15/2014   Procedure: Right Subtalar, Talonavicular Fusion;  Surgeon: Newt Minion, MD;  Location: [place];  Service: Orthopedics;  Laterality: Right;   ANKLE FUSION Right 04/18/2016   Procedure: Right Ankle Tibiocalcaneal Fusion;  Surgeon: Newt Minion, MD;  Location: [place];  Service: Orthopedics;  Laterality: Right;   ANTERIOR CERVICAL DECOMP/DISCECTOMY FUSION N/A 09/26/2013   Procedure: ANTERIOR CERVICAL DISCECTOMY FUSION C3-4, plate and screw fixation, allograft bone graft;  Surgeon: Jessy Oto, MD;  Location: [place];  Service: Orthopedics;  Laterality: N/A;   BACK SURGERY     3   BELOW KNEE LEG AMPUTATION Right 06/14/2016   right ankle and foot   CARDIAC CATHETERIZATION  X 1   CARPAL TUNNEL RELEASE Bilateral    COLONOSCOPY N/A 10/22/2014   Procedure: COLONOSCOPY;  Surgeon: Lafayette Dragon, MD;  [facility];  Service: Endoscopy;  Laterality: N/A;   COLONOSCOPY  11/19/2018   CORONARY ANGIOPLASTY WITH STENT PLACEMENT     "I have 9 stents"   ESOPHAGOGASTRODUODENOSCOPY N/A 10/19/2014   Procedure: ESOPHAGOGASTRODUODENOSCOPY (EGD);  Surgeon: Jerene Bears, MD;  Location: [facility];  Service: Endoscopy;  Laterality: N/A;   FRACTURE SURGERY     FUSION OF TALONAVICULAR JOINT Right 04/15/2014   dr duda   GASTRIC BYPASS  02/20/2019   HERNIA REPAIR     umbilical    INGUINAL HERNIA REPAIR Right 05/11/2015   Procedure: LAPAROSCOPIC REPAIR RIGHT  INGUINAL HERNIA;  Surgeon: Greer Pickerel, MD;  Location: [place];  Service: General;  Laterality: Right;   INSERTION OF MESH Right 05/11/2015   Procedure: INSERTION OF MESH;  Surgeon: Greer Pickerel, MD;  Location: [place];  Service: General;  Laterality: Right;   JOINT REPLACEMENT     KNEE CARTILAGE SURGERY Right X 12   "~ 1/2 open; ~ 1/2 scopes"   KNEE CARTILAGE SURGERY Left X 3   "3 scopes"   LAPAROSCOPIC GASTRIC SLEEVE RESECTION N/A 03/03/2019   Procedure: LAPAROSCOPIC GASTRIC SLEEVE RESECTION, Upper Endo, ERAS Pathway;  Surgeon: Greer Pickerel, MD;  Location: [facility];  Service: General;  Laterality: N/A;   LEFT HEART CATHETERIZATION WITH CORONARY ANGIOGRAM N/A 02/10/2013   Procedure: LEFT HEART CATHETERIZATION  WITH CORONARY ANGIOGRAM;  Surgeon: Burnell Blanks, MD;  Location: [facility] CATH LAB;  Service: Cardiovascular;  Laterality: N/A;   LUMBAR LAMINECTOMY N/A 07/16/2018   Procedure: LEFT L4-5 REDO PARTIAL LUMBAR HEMILAMINECTOMY WITH FORAMINOTOMY LEFT L4;  Surgeon: Jessy Oto, MD;  Location: [place];  Service: Orthopedics;  Laterality: N/A;   LUMBAR LAMINECTOMY/DECOMPRESSION MICRODISCECTOMY N/A 01/27/2014   Procedure: CENTRAL LUMBAR LAMINECTOMY L4-5 AND L3-4;  Surgeon: Jessy Oto, MD;  Location: [place];  Service: Orthopedics;  Laterality: N/A;   ORIF ANKLE FRACTURE Right 09/02/2015   Procedure: OPEN REDUCTION INTERNAL FIXATION (ORIF) ANKLE FRACTURE;  Surgeon: Newt Minion, MD;  Location: [place];  Service: Orthopedics;  Laterality: Right;   PERIPHERALLY INSERTED CENTRAL CATHETER INSERTION  09/02/2015   POLYPECTOMY     ROTATOR CUFF REPAIR Left    x 2   ROTATOR CUFF REPAIR Right    x 3   SHOULDER ARTHROSCOPY W/ ROTATOR CUFF REPAIR Bilateral    "3 on the right; 1 on the left"   SKIN SPLIT GRAFT Right 10/01/2015   Procedure: RIGHT ANKLE APPLY SKIN GRAFT SPLIT THICKNESS;  Surgeon: Newt Minion, MD;  Location: [place];   Service: Orthopedics;  Laterality: Right;   TONSILLECTOMY     TOOTH EXTRACTION     TOTAL HIP ARTHROPLASTY Right 04/17/2018   TOTAL HIP ARTHROPLASTY Right 04/17/2018   Procedure: RIGHT TOTAL HIP ARTHROPLASTY;  Surgeon: Newt Minion, MD;  Location: [place];  Service: Orthopedics;  Laterality: Right;   TOTAL HIP ARTHROPLASTY Left 06/06/2019   Procedure: LEFT TOTAL HIP ARTHROPLASTY ANTERIOR APPROACH;  Surgeon: Mcarthur Rossetti, MD;  Location: [place];  Service: Orthopedics;  Laterality: Left;   TOTAL HIP REVISION Left 05/24/2019   TOTAL KNEE ARTHROPLASTY Bilateral 4481   UMBILICAL HERNIA REPAIR     UHR   UPPER GASTROINTESTINAL ENDOSCOPY  2016   URETHRAL DILATION  X 4   VASECTOMY     WISDOM TOOTH EXTRACTION      reports that he quit smoking about 10 years ago. His smoking use included cigars. He has never used smokeless tobacco. He reports current alcohol use. He reports that he does not use drugs. family history includes Breast cancer in his mother; Colon polyps in his brother; Coronary artery disease in an other family member; Depression in his brother, mother, and another family member; Diabetes in his father; Early death in his maternal grandfather, paternal grandfather, and son; Esophageal cancer in his mother; Healthy in his son; Heart attack (age of onset: 16) in his son; Heart attack (age of onset: 49) in his maternal grandfather; Heart disease in his father, maternal grandfather, and mother; Hyperlipidemia in his father; Hypertension in his brother, father, mother, and another family member; Pancreatic cancer in his mother; Prostate cancer in his father; Skin cancer in his father; Stomach cancer in his mother. No Known Allergies Current Outpatient Medications on File Prior to Visit  Medication Sig Dispense Refill   albuterol (VENTOLIN HFA) 108 (90 Base) MCG/ACT inhaler Inhale 2 puffs into the lungs every 6 (six) hours as needed for wheezing or shortness of breath. 18 g 1   ARIPiprazole  (ABILIFY) 10 MG tablet Take 1 tablet (10 mg total) by mouth daily. 90 tablet 0   aspirin 81 MG chewable tablet Chew 1 tablet (81 mg total) by mouth daily. 35 tablet 0   atorvastatin (LIPITOR) 20 MG tablet Take 1 tablet (20 mg total) by mouth daily. (Patient taking differently: Take 20 mg by  mouth daily.) 90 tablet 0   Azelastine-Fluticasone 137-50 MCG/ACT SUSP Use both nostrils as directed twice daily 23 g 11   benztropine (COGENTIN) 0.5 MG tablet Take 1 tablet (0.5 mg total) by mouth at bedtime. 90 tablet 0   calcium carbonate (TUMS - DOSED IN MG ELEMENTAL CALCIUM) 500 MG chewable tablet Chew 1 tablet by mouth 3 (three) times daily with meals.     celecoxib (CELEBREX) 200 MG capsule Take 1 capsule (200 mg total) by mouth 2 (two) times daily. 40 capsule 0   diclofenac Sodium (VOLTAREN) 1 % GEL Apply 4 g topically 3 (three) times daily as needed (pain). 350 g 5   donepezil (ARICEPT) 5 MG tablet Take 1 tablet (5 mg total) by mouth at bedtime. 90 tablet 3   DULoxetine (CYMBALTA) 30 MG capsule Take 1 capsule (30 mg total) by mouth daily. 180 capsule 0   hydrochlorothiazide (HYDRODIURIL) 25 MG tablet Take 1 tablet (25 mg total) by mouth daily. (Patient taking differently: Take 25 mg by mouth daily.) 90 tablet 3   HYDROcodone-acetaminophen (NORCO) 10-325 MG tablet Take 1 tablet by mouth every 6 (six) hours as needed for moderate pain. 30 tablet 0   iron polysaccharides (NU-IRON) 150 MG capsule Take 1 capsule (150 mg total) by mouth daily. 90 capsule 1   losartan (COZAAR) 50 MG tablet Take 1 tablet (50 mg total) by mouth daily. 90 tablet 3   Melatonin 5 MG TABS Take 15 mg by mouth at bedtime.     methocarbamol (ROBAXIN) 500 MG tablet Take 1 tablet (500 mg total) by mouth every 8 (eight) hours as needed for muscle spasms. 90 tablet 1   MYRBETRIQ 50 MG TB24 tablet Take 50 mg by mouth at bedtime.      Omega-3 Fatty Acids (FISH OIL) 1000 MG CAPS Take 1,000 mg by mouth at bedtime.      oxybutynin (DITROPAN-XL)  10 MG 24 hr tablet Take 10 mg by mouth daily.     oxymetazoline (AFRIN) 0.05 % nasal spray Place 1 spray into both nostrils 2 (two) times daily as needed for congestion.     pantoprazole (PROTONIX) 40 MG tablet Take 1 tablet (40 mg total) by mouth 2 (two) times daily. (Patient taking differently: Take 40 mg by mouth 2 (two) times daily before a meal.) 60 tablet 2   prasugrel (EFFIENT) 5 MG TABS tablet Take 1 tablet (5 mg total) by mouth daily. 90 tablet 3   pregabalin (LYRICA) 200 MG capsule Take 1 capsule (200 mg total) by mouth in the morning, at noon, and at bedtime. 60 capsule 0   sildenafil (VIAGRA) 100 MG tablet Take 100 mg by mouth daily as needed for erectile dysfunction.      tamsulosin (FLOMAX) 0.4 MG CAPS capsule Take 0.4 mg by mouth 2 (two) times daily.     Testosterone 1.62 % GEL Place 1 Pump onto the skin daily. 88 g 1   traZODone (DESYREL) 150 MG tablet Take 1 tablet (150 mg total) by mouth at bedtime. 90 tablet 0   ZENPEP 40000-126000 units CPEP Take 2 capsules (80,000 units) with meals and 1 capsule (40,000 units) with each snack (Patient taking differently: Take 1-2 capsules by mouth See admin instructions. Take 2 capsules (80,000 units) with meals and 1 capsule (40,000 units) with each snack) 720 capsule 1   zolpidem (AMBIEN) 10 MG tablet Take 1 tablet (10 mg total) by mouth at bedtime as needed for sleep. 90 tablet 1   No  current facility-administered medications on file prior to visit.    Observations/Objective: Alert, NAD, appropriate mood and affect, resps normal, cn 2-12 intact, moves all 4s, no visible rash or swelling Lab Results  Component Value Date   WBC 6.7 07/31/2020   HGB 10.7 (L) 07/31/2020   HCT 31.3 (L) 07/31/2020   PLT 247 07/31/2020   GLUCOSE 202 (H) 07/28/2020   CHOL 134 02/24/2020   TRIG 53.0 02/24/2020   HDL 59.30 02/24/2020   LDLCALC 64 02/24/2020   ALT 18 07/23/2020   AST 19 07/23/2020   NA 134 (L) 07/28/2020   K 4.3 07/28/2020   CL 104  07/28/2020   CREATININE 1.02 07/28/2020   BUN 18 07/28/2020   CO2 22 07/28/2020   TSH 2.52 02/24/2020   PSA 0.56 07/24/2019   INR 1.1 06/03/2019   HGBA1C 5.7 (A) 02/24/2020   MICROALBUR <0.7 07/24/2019   Assessment and Plan: See notes  Follow Up Instructions: See notes   I discussed the assessment and treatment plan with the patient. The patient was provided an opportunity to ask questions and all were answered. The patient agreed with the plan and demonstrated an understanding of the instructions.   The patient was advised to call back or seek an in-person evaluation if the symptoms worsen or if the condition fails to improve as anticipated.   [name], MD

## 2020-11-09 ENCOUNTER — Telehealth: Payer: Self-pay | Admitting: Internal Medicine

## 2020-11-09 NOTE — Telephone Encounter (Signed)
Patient called and wanted to let Dr. Jenny Reichmann know that he took a Covid test on 6/17 and it was negative. He said that he has an appointment scheduled with ENT. Please advise

## 2020-11-09 NOTE — Telephone Encounter (Signed)
[place] with me.  I dont see a request or question so no advice is apparently needed

## 2020-11-10 ENCOUNTER — Other Ambulatory Visit: Payer: Self-pay

## 2020-11-10 ENCOUNTER — Telehealth (INDEPENDENT_AMBULATORY_CARE_PROVIDER_SITE_OTHER): Payer: Medicare Other | Admitting: Psychiatry

## 2020-11-10 ENCOUNTER — Telehealth: Payer: Self-pay

## 2020-11-10 ENCOUNTER — Encounter (HOSPITAL_COMMUNITY): Payer: Self-pay | Admitting: Psychiatry

## 2020-11-10 DIAGNOSIS — F419 Anxiety disorder, unspecified: Secondary | ICD-10-CM

## 2020-11-10 DIAGNOSIS — F319 Bipolar disorder, unspecified: Secondary | ICD-10-CM

## 2020-11-10 MED ORDER — TRAZODONE HCL 150 MG PO TABS: 150.0000 mg | ORAL_TABLET | Freq: Every day | ORAL | 0 refills | Status: DC

## 2020-11-10 MED ORDER — ARIPIPRAZOLE 10 MG PO TABS: 10.0000 mg | ORAL_TABLET | Freq: Every day | ORAL | 0 refills | Status: DC

## 2020-11-10 MED ORDER — DULOXETINE HCL 40 MG PO CPEP: 40.0000 mg | ORAL_CAPSULE | Freq: Two times a day (BID) | ORAL | 0 refills | Status: DC

## 2020-11-10 NOTE — Telephone Encounter (Signed)
Pt called stating that he was going to go to [facility] to [facility]then his leg but he needs to know by how much.    He would like to know if we have that information or if we can get it of the xray's ?

## 2020-11-10 NOTE — Progress Notes (Signed)
Virtual Visit via Telephone Note  I connected with Travis Novak Sr. on 11/10/20 at  2:00 PM EDT by telephone and verified that I am speaking with the correct person using two identifiers.  Location: Patient: Home Provider: Home Office   I discussed the limitations, risks, security and privacy concerns of performing an evaluation and management service by telephone and the availability of in person appointments. I also discussed with the patient that there may be a patient responsible charge related to this service. The patient expressed understanding and agreed to proceed.   History of Present Illness: Patient is evaluated by phone session.  He admitted lately feeling more sad, depressed and not motivated to do things.  He tried to walk but he get easily tired and fatigued.  He admitted there are days when he does not take shower and does not leave the house.  He is not sure what causes the depression as things are going otherwise good.  He is happy that he is going to become a great grandpa in November as his grandson who lives in [place], they are having a baby.  Patient has planned to visit [place] in November.  Patient sleeps good and he takes Ambien, trazodone and melatonin.  He feels his anger, irritability and mood swing is under control.  Recently he seen his PCP and started taking phentermine to help his weight.  He has no tremors shakes or any EPS.  He denies any hallucination, paranoia or any suicidal thoughts.  He does not drive and his nephew helps him to go to grocery stores and doctor's appointment.  He is compliant with Cymbalta, Abilify and trazodone.     Past Psychiatric History:  Saw psychiatrist in [place] and [place].  H/O mood swings, suicidal gesture, anger, hallucination, irritability and increase spending. Married 5 times. H/O inpatient in 2014 due to overdose on Xanax and alcohol.  Did IOP. Took Effexor, Xanax, Klonopin, Ambien, Lunesta, Wellbutrin, Ambien, Remeron and  Prozac. Abilify started in our office.   Psychiatric Specialty Exam: Physical Exam  Review of Systems  Weight (!) 320 lb (145.2 kg).There is no height or weight on file to calculate BMI.  General Appearance: NA  Eye Contact:  NA  Speech:  Slow  Volume:  Decreased  Mood:  Dysphoric  Affect:  NA  Thought Process:  Descriptions of Associations: Intact  Orientation:  Full (Time, Place, and Person)  Thought Content:  Rumination  Suicidal Thoughts:  No  Homicidal Thoughts:  No  Memory:  Immediate;   Good Recent;   Good Remote;   Good  Judgement:  Intact  Insight:  Present  Psychomotor Activity:  NA  Concentration:  Concentration: Fair and Attention Span: Fair  Recall:  Good  Fund of Knowledge:  Good  Language:  Good  Akathisia:  No  Handed:  Right  AIMS (if indicated):     Assets:  Communication Skills Desire for Improvement Housing Resilience  ADL's:  Intact  Cognition:  WNL  Sleep:   ok      Assessment and Plan: Bipolar disorder type I.  Anxiety.  I reviewed current medication.  Patient is no longer taking any narcotic pain medication and muscle relaxant.  He is happy that he is not on any pain medicine but not sure why he is more sad depressed with lack of motivation.  I recommend to try Cymbalta 40 mg twice a day and keep the Abilify 10 mg daily and trazodone 150 mg at bedtime.  He is not taking Cogentin but is still compliant with Ambien, melatonin.  Recommended to call us back if is any question or any concern.  Follow-up in 6 weeks.  Follow Up Instructions:    I discussed the assessment and treatment plan with the patient. The patient was provided an opportunity to ask questions and all were answered. The patient agreed with the plan and demonstrated an understanding of the instructions.   The patient was advised to call back or seek an in-person evaluation if the symptoms worsen or if the condition fails to improve as anticipated.  I provided 16 minutes of  non-face-to-face time during this encounter.   Kathlee Nations, MD

## 2020-11-11 NOTE — Telephone Encounter (Signed)
Do you know anything about this? I was not aware there was a LLD does he need another appt for x rays?

## 2020-11-16 ENCOUNTER — Ambulatory Visit: Payer: Medicare HMO | Admitting: Psychology

## 2020-11-16 DIAGNOSIS — J324 Chronic pansinusitis: Secondary | ICD-10-CM | POA: Diagnosis not present

## 2020-11-16 DIAGNOSIS — Z7901 Long term (current) use of anticoagulants: Secondary | ICD-10-CM | POA: Diagnosis not present

## 2020-11-16 DIAGNOSIS — J343 Hypertrophy of nasal turbinates: Secondary | ICD-10-CM | POA: Diagnosis not present

## 2020-11-16 NOTE — Telephone Encounter (Signed)
Yes lets have him come in for xray low ap pelvis to see ischial tuberosities

## 2020-11-17 ENCOUNTER — Ambulatory Visit (INDEPENDENT_AMBULATORY_CARE_PROVIDER_SITE_OTHER): Payer: Medicare Other | Admitting: Psychology

## 2020-11-17 DIAGNOSIS — F331 Major depressive disorder, recurrent, moderate: Secondary | ICD-10-CM

## 2020-11-19 ENCOUNTER — Ambulatory Visit (INDEPENDENT_AMBULATORY_CARE_PROVIDER_SITE_OTHER): Payer: Medicare Other | Admitting: Family

## 2020-11-19 ENCOUNTER — Ambulatory Visit: Payer: Self-pay

## 2020-11-19 DIAGNOSIS — M2170 Unequal limb length (acquired), unspecified site: Secondary | ICD-10-CM | POA: Diagnosis not present

## 2020-11-23 ENCOUNTER — Telehealth: Payer: Self-pay

## 2020-11-23 ENCOUNTER — Encounter: Payer: Self-pay | Admitting: Family

## 2020-11-23 NOTE — Progress Notes (Signed)
Office Visit Note   Patient: Travis BACKLUND Sr.           Date of Birth: 1951-03-23           MRN: [id number] Visit Date: 11/19/2020              Requested by: [name], MD [place],  [place] [address] PCP: [name], MD  No chief complaint on file.     HPI: The patient is a 70 year old gentleman seen today for a limb length discrepancy.  He has had new prosthetic fabrication and continues to have issues with pain and gait due to limb length discrepancy.  We will do radiographs today to determine the distance.  Assessment & Plan: Visit Diagnoses:  1. Leg length discrepancy     Plan: The right leg is 9 mm shorter than the left.  He will return to his prosthetists for modification.  Follow-Up Instructions: No follow-ups on file.   Ortho Exam  Patient is alert, oriented, no adenopathy, well-dressed, normal affect, normal respiratory effort.  Imaging: No results found. No images are attached to the encounter.  Labs: Lab Results  Component Value Date   HGBA1C 5.7 (A) 02/24/2020   HGBA1C 5.9 02/24/2020   HGBA1C 5.3 07/24/2019   ESRSEDRATE 22 (H) 09/03/2018   ESRSEDRATE 35 (H) 08/15/2018   ESRSEDRATE 20 (H) 09/03/2013   CRP 7.3 09/03/2018   CRP 14.1 (H) 08/15/2018   CRP 10.7 (H) 09/03/2013   REPTSTATUS 08/16/2018 FINAL 08/15/2018   GRAMSTAIN  09/02/2013    WBC PRESENT, PREDOMINANTLY MONONUCLEAR NO ORGANISMS SEEN CYTOSPIN Performed by [facility] Performed at [facility]  09/02/2013    NO ORGANISMS SEEN WBC PRESENT, PREDOMINANTLY MONONUCLEAR CYTOSPIN Gram Stain Report Called to,Read Back By and Verified With: A LEDWELL RN 1931 09/02/13 A NAVARRO   CULT (A) 08/15/2018    <10,000 COLONIES/mL INSIGNIFICANT GROWTH Performed at [facility], [place] [address], [place], [place] [address]    LABORGA STAPHYLOCOCCUS SPECIES (COAGULASE NEGATIVE) 04/08/2015     Lab Results  Component Value Date   ALBUMIN  3.5 07/23/2020   ALBUMIN 4.3 02/24/2020   ALBUMIN 3.9 07/24/2019    Lab Results  Component Value Date   MG 1.5 (L) 08/15/2018   MG 1.9 05/03/2013   Lab Results  Component Value Date   VD25OH 29.84 (L) 02/24/2020   VD25OH 24.67 (L) 07/24/2019    No results found for: PREALBUMIN CBC EXTENDED Latest Ref Rng & Units 07/31/2020 07/30/2020 07/28/2020  WBC 4.0 - 10.5 K/uL 6.7 7.4 14.1(H)  RBC 4.22 - 5.81 MIL/uL 3.45(L) 2.96(L) 4.51  HGB 13.0 - 17.0 g/dL 10.7(L) 9.4(L) 14.2  HCT 39.0 - 52.0 % 31.3(L) 26.3(L) 39.9  PLT 150 - 400 K/uL 247 194 197  NEUTROABS 1.7 - 7.7 K/uL 5.4 5.9 -  LYMPHSABS 0.7 - 4.0 K/uL 0.6(L) 0.6(L) -     There is no height or weight on file to calculate BMI.  Orders:  Orders Placed This Encounter  Procedures   XR Pelvis 1-2 Views   No orders of the defined types were placed in this encounter.    Procedures: No procedures performed  Clinical Data: No additional findings.  ROS:  All other systems negative, except as noted in the HPI. Review of Systems  Constitutional:  Negative for chills and fever.  Musculoskeletal:  Positive for back pain and gait problem.  Skin:  Negative for wound.  Objective: Vital Signs: There were no vitals taken for this visit.  Specialty Comments:  No specialty comments available.  PMFS History: Patient Active Problem List   Diagnosis Date Noted   Sinusitis 11/02/2020   Memory loss or impairment 10/11/2020   Right arm pain 09/18/2020   Aortic atherosclerosis ([place]) 09/16/2020   Spondylolisthesis, lumbar region    Status post lumbar spinal fusion 07/27/2020   Nasal sinus polyp 06/16/2020   Vitamin D deficiency 02/24/2020   Cough 07/29/2019   Wheezing 07/29/2019   Possible exposure to STD 07/24/2019   Unilateral primary osteoarthritis, left hip 06/06/2019   Status post total replacement of left hip 06/06/2019   S/P laparoscopic sleeve gastrectomy 03/03/2019   Rash 01/24/2019   Fever 08/15/2018   UTI (urinary  tract infection) 08/15/2018   Fall at home, initial encounter 08/15/2018   Chronic anemia 08/15/2018   Hypokalemia 08/15/2018   Bowel incontinence 07/25/2018   Other spondylosis with radiculopathy, lumbar region 07/16/2018    Class: Chronic   Status post lumbar laminectomy 07/16/2018   Status post THR (total hip replacement) 04/17/2018   Unilateral primary osteoarthritis, right hip    Morbid obesity with BMI of 40.0-44.9, adult ([place]) 02/28/2018   Myocardial infarction ([place]) 02/25/2018   Coronary artery disease involving native coronary artery of native heart 02/25/2018   PAD (peripheral artery disease) ([place]) 02/25/2018   S/P BKA (below knee amputation) unilateral, right ([place]) 02/25/2018   Sacroiliitis ([place]) 02/25/2018   SOB (shortness of breath) 01/03/2018   Acute on chronic heart failure ([place]) 01/03/2018   Severe right groin pain 12/20/2017   Morbid obesity ([place]) 10/09/2017   Low back pain 07/13/2017   Leukoplakia, tongue 01/26/2017   Skin lesion 10/20/2016   S/P unilateral BKA (below knee amputation), right ([place]) 06/14/2016   Charcot foot due to diabetes mellitus ([place])    Charcot's arthropathy associated with type 2 diabetes mellitus ([place]) 04/11/2016   Encounter for well adult exam with abnormal findings 11/05/2015   Major depression 09/13/2015   S/P TKR (total knee replacement) bilaterally 09/13/2015   GERD (gastroesophageal reflux disease) 09/08/2015   S/P laparoscopic hernia repair 05/11/2015   PPD positive 04/08/2015   Benign neoplasm of descending colon    Benign neoplasm of cecum    Acute blood loss as cause of postoperative anemia    Chronic anticoagulation    Occult blood positive stool 10/17/2014   General weakness 07/14/2014   Urinary incontinence 07/14/2014   Headache(784.0) 10/15/2013   Spinal stenosis in cervical region 09/26/2013    Class: Chronic   Congenital spinal stenosis of lumbar region 09/26/2013    Class: Chronic   Hand joint pain 06/10/2013    Rotator cuff tear arthropathy of both shoulders 06/10/2013   Skin lesion of cheek 05/01/2013   Pain of right thumb 04/03/2013   Balance disorder 03/12/2013   Gait disorder 03/12/2013   Tremor 03/12/2013   Left hip pain 03/12/2013   Pre-ulcerative corn or callous 02/06/2013   Anxiety 11/12/2011   OSA on CPAP 11/07/2011   Bradycardia 10/20/2011   Insomnia 10/04/2011   Obesity 01/12/2011   Diabetes mellitus type 2 in obese ([place]) 09/27/2010   ERECTILE DYSFUNCTION, ORGANIC 05/30/2010   Degenerative disc disease, lumbar 04/19/2010   SCIATICA, LEFT 04/19/2010   Chronic pain syndrome 10/27/2009   Hyperlipidemia 07/15/2009   Essential hypertension 06/24/2009   Coronary artery disease involving native coronary artery of native heart without angina pectoris 06/24/2009   Allergic rhinitis 06/24/2009   URETHRAL STRICTURE 06/24/2009  DEGENERATIVE JOINT DISEASE 06/24/2009   SHOULDER PAIN, BILATERAL 06/24/2009   FATIGUE 06/24/2009   NEPHROLITHIASIS, HX OF 06/24/2009   Past Medical History:  Diagnosis Date   ALLERGIC RHINITIS 06/24/2009   Allergy    Anemia    IDA   Anxiety 11/12/2011   Adequate for discharge    Arthritis    "all my joints" (09/30/2013)   Arthritis of foot, right, degenerative 04/15/2014   Balance disorder 03/12/2013   Benign neoplasm of cecum    Benign neoplasm of descending colon    CAD (coronary artery disease) 06/24/2009   5 stents placed in 2007     Chronic anticoagulation    Chronic pain syndrome 10/27/2009   of ankle, shoulders, low back.  sciatica.    Closed fracture of right foot 10/17/2014   CORONARY ARTERY DISEASE 06/24/2009   a. s/p multiple PCIs - In 2008 he had a Taxus DES to the mild LAD, Endeavor DES to mid LCX and distal LCX. In January 2009 he had DES to distal LCX, mid LCX and proximal LCX. In November 2009 had BMS x 2 to the mid RCA. Cath 10/2011 with patent stents, noncardiac CP. LHC 01/2013: patent stents (noncardiac CP).   DEGENERATIVE JOINT DISEASE  06/24/2009   Qualifier: Diagnosis of  By: [name], [name]    Depression    Depression with anxiety    Prior suicide attempt(08/25/19-pt states not suicide attempt)   [place] DISEASE, LUMBAR 04/19/2010   ERECTILE DYSFUNCTION, ORGANIC 05/30/2010   Essential hypertension 06/24/2009   Qualifier: Diagnosis of  By: [name], [name]    Fibromyalgia    Fracture dislocation of ankle joint 09/02/2015   Gait disorder 03/12/2013   General weakness 07/14/2014   GERD (gastroesophageal reflux disease) 09/08/2015   08/25/15-pt states was cardiac origin, not GERD   Hand joint pain 06/10/2013   Heart murmur    Hepatitis C    treated pt. unknown with what he was a teenager   History of kidney stones    Hyperlipidemia 07/15/2009   Qualifier: Diagnosis of  By: Aundra Dubin, MD, [place]     HYPERLIPIDEMIA-MIXED 07/15/2009   08/25/19-pt state cholesterol was normal range   HYPERTENSION 06/24/2009   Insomnia 10/04/2011   Irregular heart beat    Left hip pain 03/12/2013   Injected under ultrasound guidance on June 24, 2013    Major depression 09/13/2015   Myocardial infarction [facility]) 2008   Non-cardiac chest pain 10/2011, 01/2013   Obesity    Occult blood positive stool 10/17/2014   Open ankle fracture 09/02/2015   OSA (obstructive sleep apnea)    not using CPAP (09/30/2013)   Pain of right thumb 04/03/2013   Pneumonia    PPD positive 04/08/2015   Pre-ulcerative corn or callous 02/06/2013   Rotator cuff tear arthropathy of both shoulders 06/10/2013   History of bilateral shoulder cuff surgery for rotator cuff tears. Reports increase in pain 09/11/2015 during physical therapy of the left shoulder.    SCIATICA, LEFT 04/19/2010   Qualifier: Diagnosis of  By: [name], [name]    Sleep apnea    wears cpap 08/25/19-States not using due to nasal stuffiness.   Spinal stenosis in cervical region 09/26/2013   Spinal stenosis, lumbar region, with neurogenic claudication 09/26/2013   Type II diabetes mellitus ([place]) 2012   no meds in 09/2014.     Umbilical hernia    Uncontrolled type 2 DM with peripheral circulatory disorder [facility]) 10/04/2013   08/25/19- pt states  A1C normal, no diabetes   URETHRAL STRICTURE 06/24/2009   self catheterizes.     Family History  Problem Relation Age of Onset   Depression Mother    Heart disease Mother    Hypertension Mother    Breast cancer Mother        primary cancer   Stomach cancer Mother    Esophageal cancer Mother    Pancreatic cancer Mother    Diabetes Father    Heart disease Father        CABG   Hypertension Father    Hyperlipidemia Father    Prostate cancer Father    Skin cancer Father    Depression Brother        x 2   Hypertension Brother        x2   Colon polyps Brother    Heart attack Son 89   Early death Son    Heart disease Maternal Grandfather    Early death Maternal Grandfather    Heart attack Maternal Grandfather 58   Early death Paternal Grandfather    Coronary artery disease Other    Hypertension Other    Depression Other    Healthy Son    Colon cancer Neg Hx    Rectal cancer Neg Hx     Past Surgical History:  Procedure Laterality Date   AMPUTATION Right 06/14/2016   Procedure: AMPUTATION BELOW KNEE;  Surgeon: Newt Minion, MD;  Location: [place];  Service: Orthopedics;  Laterality: Right;   ANKLE FUSION Right 04/15/2014   Procedure: Right Subtalar, Talonavicular Fusion;  Surgeon: Newt Minion, MD;  Location: [place];  Service: Orthopedics;  Laterality: Right;   ANKLE FUSION Right 04/18/2016   Procedure: Right Ankle Tibiocalcaneal Fusion;  Surgeon: Newt Minion, MD;  Location: [place];  Service: Orthopedics;  Laterality: Right;   ANTERIOR CERVICAL DECOMP/DISCECTOMY FUSION N/A 09/26/2013   Procedure: ANTERIOR CERVICAL DISCECTOMY FUSION C3-4, plate and screw fixation, allograft bone graft;  Surgeon: Jessy Oto, MD;  Location: [place];  Service: Orthopedics;  Laterality: N/A;   BACK SURGERY     3   BELOW KNEE LEG AMPUTATION Right 06/14/2016   right ankle and foot    CARDIAC CATHETERIZATION  X 1   CARPAL TUNNEL RELEASE Bilateral    COLONOSCOPY N/A 10/22/2014   Procedure: COLONOSCOPY;  Surgeon: Lafayette Dragon, MD;  Location: [facility] ENDOSCOPY;  Service: Endoscopy;  Laterality: N/A;   COLONOSCOPY  11/19/2018   CORONARY ANGIOPLASTY WITH STENT PLACEMENT     "I have 9 stents"   ESOPHAGOGASTRODUODENOSCOPY N/A 10/19/2014   Procedure: ESOPHAGOGASTRODUODENOSCOPY (EGD);  Surgeon: Jerene Bears, MD;  Location: [facility] ENDOSCOPY;  Service: Endoscopy;  Laterality: N/A;   FRACTURE SURGERY     FUSION OF TALONAVICULAR JOINT Right 04/15/2014   dr duda   GASTRIC BYPASS  02/20/2019   HERNIA REPAIR     umbilical   INGUINAL HERNIA REPAIR Right 05/11/2015   Procedure: LAPAROSCOPIC REPAIR RIGHT  INGUINAL HERNIA;  Surgeon: Greer Pickerel, MD;  Location: [place];  Service: General;  Laterality: Right;   INSERTION OF MESH Right 05/11/2015   Procedure: INSERTION OF MESH;  Surgeon: Greer Pickerel, MD;  Location: [place];  Service: General;  Laterality: Right;   JOINT REPLACEMENT     KNEE CARTILAGE SURGERY Right X 12   "~ 1/2 open; ~ 1/2 scopes"   KNEE CARTILAGE SURGERY Left X 3   "3 scopes"   LAPAROSCOPIC GASTRIC SLEEVE RESECTION N/A 03/03/2019   Procedure:  LAPAROSCOPIC GASTRIC SLEEVE RESECTION, Upper Endo, ERAS Pathway;  Surgeon: Greer Pickerel, MD;  Location: [facility];  Service: General;  Laterality: N/A;   LEFT HEART CATHETERIZATION WITH CORONARY ANGIOGRAM N/A 02/10/2013   Procedure: LEFT HEART CATHETERIZATION WITH CORONARY ANGIOGRAM;  Surgeon: Burnell Blanks, MD;  Location: [facility] CATH LAB;  Service: Cardiovascular;  Laterality: N/A;   LUMBAR LAMINECTOMY N/A 07/16/2018   Procedure: LEFT L4-5 REDO PARTIAL LUMBAR HEMILAMINECTOMY WITH FORAMINOTOMY LEFT L4;  Surgeon: Jessy Oto, MD;  Location: [place];  Service: Orthopedics;  Laterality: N/A;   LUMBAR LAMINECTOMY/DECOMPRESSION MICRODISCECTOMY N/A 01/27/2014   Procedure: CENTRAL LUMBAR LAMINECTOMY L4-5 AND L3-4;  Surgeon: Jessy Oto, MD;  Location:  [place];  Service: Orthopedics;  Laterality: N/A;   ORIF ANKLE FRACTURE Right 09/02/2015   Procedure: OPEN REDUCTION INTERNAL FIXATION (ORIF) ANKLE FRACTURE;  Surgeon: Newt Minion, MD;  Location: [place];  Service: Orthopedics;  Laterality: Right;   PERIPHERALLY INSERTED CENTRAL CATHETER INSERTION  09/02/2015   POLYPECTOMY     ROTATOR CUFF REPAIR Left    x 2   ROTATOR CUFF REPAIR Right    x 3   SHOULDER ARTHROSCOPY W/ ROTATOR CUFF REPAIR Bilateral    "3 on the right; 1 on the left"   SKIN SPLIT GRAFT Right 10/01/2015   Procedure: RIGHT ANKLE APPLY SKIN GRAFT SPLIT THICKNESS;  Surgeon: Newt Minion, MD;  Location: [place];  Service: Orthopedics;  Laterality: Right;   TONSILLECTOMY     TOOTH EXTRACTION     TOTAL HIP ARTHROPLASTY Right 04/17/2018   TOTAL HIP ARTHROPLASTY Right 04/17/2018   Procedure: RIGHT TOTAL HIP ARTHROPLASTY;  Surgeon: Newt Minion, MD;  Location: [place];  Service: Orthopedics;  Laterality: Right;   TOTAL HIP ARTHROPLASTY Left 06/06/2019   Procedure: LEFT TOTAL HIP ARTHROPLASTY ANTERIOR APPROACH;  Surgeon: Mcarthur Rossetti, MD;  Location: [place];  Service: Orthopedics;  Laterality: Left;   TOTAL HIP REVISION Left 05/24/2019   TOTAL KNEE ARTHROPLASTY Bilateral 3664   UMBILICAL HERNIA REPAIR     UHR   UPPER GASTROINTESTINAL ENDOSCOPY  2016   URETHRAL DILATION  X 4   VASECTOMY     WISDOM TOOTH EXTRACTION     Social History   Occupational History   Occupation: disabled since 2006 due to ortho. heart, Air traffic controller: UNEMPLOYED   Occupation: part time work as an Multimedia programmer, wrestling, and baseball coach [employer]  Tobacco Use   Smoking status: Former    Pack years: 0.00    Types: Cigars    Quit date: 08/28/2010    Years since quitting: 10.2   Smokeless tobacco: Never   Tobacco comments:    04/18/2016 "smoked 1 cigar/wk when I did smoke"  Vaping Use   Vaping Use: Never used  Substance and Sexual Activity   Alcohol use: Yes     Alcohol/week: 0.0 standard drinks    Comment: occasionally   Drug use: No   Sexual activity: Not Currently

## 2020-11-23 NOTE — Telephone Encounter (Signed)
Per Dr. Louanne Skye, he wants him to start with a gradual incline, don't start with steep incline. I called and advised patient he states that he has a lot of steeper inclines around him.

## 2020-11-23 NOTE — Telephone Encounter (Signed)
Patient called he wants to know if he is able to walk up hill patient is requesting a call back:[phone]

## 2020-11-25 DIAGNOSIS — M4726 Other spondylosis with radiculopathy, lumbar region: Secondary | ICD-10-CM | POA: Diagnosis not present

## 2020-11-25 DIAGNOSIS — M4326 Fusion of spine, lumbar region: Secondary | ICD-10-CM | POA: Diagnosis not present

## 2020-11-25 DIAGNOSIS — M48061 Spinal stenosis, lumbar region without neurogenic claudication: Secondary | ICD-10-CM | POA: Diagnosis not present

## 2020-11-29 ENCOUNTER — Encounter: Payer: Medicare Other | Admitting: Internal Medicine

## 2020-11-29 ENCOUNTER — Telehealth: Payer: Self-pay | Admitting: Orthopedic Surgery

## 2020-11-29 NOTE — Telephone Encounter (Signed)
Received call from patient advised [facility] need a Rx faxed over to them for his right leg. Patient said he was in the office week before last. The number to contact patient is [phone]

## 2020-11-30 DIAGNOSIS — H5213 Myopia, bilateral: Secondary | ICD-10-CM | POA: Diagnosis not present

## 2020-11-30 DIAGNOSIS — H2513 Age-related nuclear cataract, bilateral: Secondary | ICD-10-CM | POA: Diagnosis not present

## 2020-11-30 DIAGNOSIS — H1045 Other chronic allergic conjunctivitis: Secondary | ICD-10-CM | POA: Diagnosis not present

## 2020-11-30 DIAGNOSIS — H524 Presbyopia: Secondary | ICD-10-CM | POA: Diagnosis not present

## 2020-11-30 DIAGNOSIS — H0102A Squamous blepharitis right eye, upper and lower eyelids: Secondary | ICD-10-CM | POA: Diagnosis not present

## 2020-11-30 DIAGNOSIS — H52223 Regular astigmatism, bilateral: Secondary | ICD-10-CM | POA: Diagnosis not present

## 2020-11-30 DIAGNOSIS — G4733 Obstructive sleep apnea (adult) (pediatric): Secondary | ICD-10-CM | POA: Diagnosis not present

## 2020-11-30 DIAGNOSIS — H35372 Puckering of macula, left eye: Secondary | ICD-10-CM | POA: Diagnosis not present

## 2020-11-30 DIAGNOSIS — H0102B Squamous blepharitis left eye, upper and lower eyelids: Secondary | ICD-10-CM | POA: Diagnosis not present

## 2020-11-30 DIAGNOSIS — E119 Type 2 diabetes mellitus without complications: Secondary | ICD-10-CM | POA: Diagnosis not present

## 2020-11-30 LAB — HM DIABETES EYE EXAM

## 2020-12-01 ENCOUNTER — Other Ambulatory Visit: Payer: Self-pay

## 2020-12-01 ENCOUNTER — Other Ambulatory Visit: Payer: Self-pay | Admitting: Internal Medicine

## 2020-12-01 ENCOUNTER — Ambulatory Visit (INDEPENDENT_AMBULATORY_CARE_PROVIDER_SITE_OTHER): Payer: Medicare Other | Admitting: Internal Medicine

## 2020-12-01 ENCOUNTER — Encounter: Payer: Self-pay | Admitting: Internal Medicine

## 2020-12-01 VITALS — BP 134/78 | HR 52 | Temp 98.3°F | Resp 18 | Ht 72.0 in | Wt 317.2 lb

## 2020-12-01 DIAGNOSIS — I10 Essential (primary) hypertension: Secondary | ICD-10-CM | POA: Diagnosis not present

## 2020-12-01 DIAGNOSIS — E669 Obesity, unspecified: Secondary | ICD-10-CM

## 2020-12-01 DIAGNOSIS — R413 Other amnesia: Secondary | ICD-10-CM

## 2020-12-01 DIAGNOSIS — E538 Deficiency of other specified B group vitamins: Secondary | ICD-10-CM | POA: Diagnosis not present

## 2020-12-01 DIAGNOSIS — D649 Anemia, unspecified: Secondary | ICD-10-CM | POA: Diagnosis not present

## 2020-12-01 DIAGNOSIS — E1169 Type 2 diabetes mellitus with other specified complication: Secondary | ICD-10-CM

## 2020-12-01 DIAGNOSIS — E559 Vitamin D deficiency, unspecified: Secondary | ICD-10-CM

## 2020-12-01 LAB — LIPID PANEL
Cholesterol: 113 mg/dL (ref 0–200)
HDL: 42.3 mg/dL (ref >39.00)
LDL Cholesterol: 54 mg/dL (ref 0–99)
NonHDL: 70.53
Total CHOL/HDL Ratio: 3
Triglycerides: 85 mg/dL (ref 0.0–149.0)
VLDL: 17 mg/dL (ref 0.0–40.0)

## 2020-12-01 LAB — HEPATIC FUNCTION PANEL
ALT: 11 U/L (ref 0–53)
AST: 22 U/L (ref 0–37)
Albumin: 4.1 g/dL (ref 3.5–5.2)
Alkaline Phosphatase: 58 U/L (ref 39–117)
Bilirubin, Direct: 0.1 mg/dL (ref 0.0–0.3)
Total Bilirubin: 0.4 mg/dL (ref 0.2–1.2)
Total Protein: 6.6 g/dL (ref 6.0–8.3)

## 2020-12-01 LAB — BASIC METABOLIC PANEL
BUN: 21 mg/dL (ref 6–23)
CO2: 32 mEq/L (ref 19–32)
Calcium: 9.1 mg/dL (ref 8.4–10.5)
Chloride: 101 mEq/L (ref 96–112)
Creatinine, Ser: 1.23 mg/dL (ref 0.40–1.50)
GFR: 60.32 mL/min (ref >60.00)
Glucose, Bld: 84 mg/dL (ref 70–99)
Potassium: 4.3 mEq/L (ref 3.5–5.1)
Sodium: 139 mEq/L (ref 135–145)

## 2020-12-01 LAB — FERRITIN: Ferritin: 25.6 ng/mL (ref 22.0–322.0)

## 2020-12-01 LAB — VITAMIN B12: Vitamin B-12: 504 pg/mL (ref 211–911)

## 2020-12-01 LAB — IBC PANEL
Iron: 49 ug/dL (ref 42–165)
Saturation Ratios: 15.2 % — ABNORMAL LOW (ref 20.0–50.0)
Transferrin: 231 mg/dL (ref 212.0–360.0)

## 2020-12-01 LAB — VITAMIN D 25 HYDROXY (VIT D DEFICIENCY, FRACTURES): VITD: 53.07 ng/mL (ref 30.00–100.00)

## 2020-12-01 LAB — HEMOGLOBIN A1C: Hgb A1c MFr Bld: 6.1 % (ref 4.6–6.5)

## 2020-12-01 NOTE — Progress Notes (Signed)
Patient ID: Travis Dyer., male   DOB: 30-Mar-1951, 70 y.o.   MRN: [id number]        Chief Complaint: follow up HTN, HLD and hyperglycemia, obesity, memory changes       HPI:  Travis Fearnow. is a 70 y.o. male here to f/u and states with recent tx his memory changes have resolved.  Not yet taking Vit D . Just got new 58 mo old beagle/jack russell. Feels stronger psychologically.  BP has been < 140/90 at home.  Pt denies chest pain, increased sob or doe, wheezing, orthopnea, PND, increased LE swelling, palpitations, dizziness or syncope.   Pt denies polydipsia, polyuria, or new focal neuro s/s.  No overt bleeding,  Has been successful with losing wt with phentermine, asking for refill phentermine.   Pt denies fever, wt loss, night sweats, loss of appetite, or other constitutional symptoms.  No other new .  Not taking Vitamin D  Wt Readings from Last 3 Encounters:  12/01/20 (!) 317 lb 3.2 oz (143.9 kg)  10/27/20 (!) 318 lb (144.2 kg)  10/11/20 (!) 325 lb (147.4 kg)   BP Readings from Last 3 Encounters:  12/01/20 134/78  10/27/20 (!) 150/77  10/11/20 (!) 152/78         Past Medical History:  Diagnosis Date   ALLERGIC RHINITIS 06/24/2009   Allergy    Anemia    IDA   Anxiety 11/12/2011   Adequate for discharge    Arthritis    "all my joints" (09/30/2013)   Arthritis of foot, right, degenerative 04/15/2014   Balance disorder 03/12/2013   Benign neoplasm of cecum    Benign neoplasm of descending colon    CAD (coronary artery disease) 06/24/2009   5 stents placed in 2007     Chronic anticoagulation    Chronic pain syndrome 10/27/2009   of ankle, shoulders, low back.  sciatica.    Closed fracture of right foot 10/17/2014   CORONARY ARTERY DISEASE 06/24/2009   a. s/p multiple PCIs - In 2008 he had a Taxus DES to the mild LAD, Endeavor DES to mid LCX and distal LCX. In January 2009 he had DES to distal LCX, mid LCX and proximal LCX. In November 2009 had BMS x 2 to the mid RCA. Cath  10/2011 with patent stents, noncardiac CP. LHC 01/2013: patent stents (noncardiac CP).   DEGENERATIVE JOINT DISEASE 06/24/2009   Qualifier: Diagnosis of  By: [name], [name]    Depression    Depression with anxiety    Prior suicide attempt(08/25/19-pt states not suicide attempt)   [place] DISEASE, LUMBAR 04/19/2010   ERECTILE DYSFUNCTION, ORGANIC 05/30/2010   Essential hypertension 06/24/2009   Qualifier: Diagnosis of  By: [name], [name]    Fibromyalgia    Fracture dislocation of ankle joint 09/02/2015   Gait disorder 03/12/2013   General weakness 07/14/2014   GERD (gastroesophageal reflux disease) 09/08/2015   08/25/15-pt states was cardiac origin, not GERD   Hand joint pain 06/10/2013   Heart murmur    Hepatitis C    treated pt. unknown with what he was a teenager   History of kidney stones    Hyperlipidemia 07/15/2009   Qualifier: Diagnosis of  By: Aundra Dubin, MD, [place]     HYPERLIPIDEMIA-MIXED 07/15/2009   08/25/19-pt state cholesterol was normal range   HYPERTENSION 06/24/2009   Insomnia 10/04/2011   Irregular heart beat    Left hip pain 03/12/2013   Injected under ultrasound guidance on  June 24, 2013    Major depression 09/13/2015   Myocardial infarction [facility]) 2008   Non-cardiac chest pain 10/2011, 01/2013   Obesity    Occult blood positive stool 10/17/2014   Open ankle fracture 09/02/2015   OSA (obstructive sleep apnea)    not using CPAP (09/30/2013)   Pain of right thumb 04/03/2013   Pneumonia    PPD positive 04/08/2015   Pre-ulcerative corn or callous 02/06/2013   Rotator cuff tear arthropathy of both shoulders 06/10/2013   History of bilateral shoulder cuff surgery for rotator cuff tears. Reports increase in pain 09/11/2015 during physical therapy of the left shoulder.    SCIATICA, LEFT 04/19/2010   Qualifier: Diagnosis of  By: [name], [name]    Sleep apnea    wears cpap 08/25/19-States not using due to nasal stuffiness.   Spinal stenosis in cervical region 09/26/2013   Spinal stenosis,  lumbar region, with neurogenic claudication 09/26/2013   Type II diabetes mellitus ([place]) 2012   no meds in 09/2014.    Umbilical hernia    Uncontrolled type 2 DM with peripheral circulatory disorder ([place]) 10/04/2013   08/25/19- pt states A1C normal, no diabetes   URETHRAL STRICTURE 06/24/2009   self catheterizes.    Past Surgical History:  Procedure Laterality Date   AMPUTATION Right 06/14/2016   Procedure: AMPUTATION BELOW KNEE;  Surgeon: Newt Minion, MD;  Location: [place];  Service: Orthopedics;  Laterality: Right;   ANKLE FUSION Right 04/15/2014   Procedure: Right Subtalar, Talonavicular Fusion;  Surgeon: Newt Minion, MD;  Location: [place];  Service: Orthopedics;  Laterality: Right;   ANKLE FUSION Right 04/18/2016   Procedure: Right Ankle Tibiocalcaneal Fusion;  Surgeon: Newt Minion, MD;  Location: [place];  Service: Orthopedics;  Laterality: Right;   ANTERIOR CERVICAL DECOMP/DISCECTOMY FUSION N/A 09/26/2013   Procedure: ANTERIOR CERVICAL DISCECTOMY FUSION C3-4, plate and screw fixation, allograft bone graft;  Surgeon: Jessy Oto, MD;  Location: [place];  Service: Orthopedics;  Laterality: N/A;   BACK SURGERY     3   BELOW KNEE LEG AMPUTATION Right 06/14/2016   right ankle and foot   CARDIAC CATHETERIZATION  X 1   CARPAL TUNNEL RELEASE Bilateral    COLONOSCOPY N/A 10/22/2014   Procedure: COLONOSCOPY;  Surgeon: Lafayette Dragon, MD;  Location: [facility] ENDOSCOPY;  Service: Endoscopy;  Laterality: N/A;   COLONOSCOPY  11/19/2018   CORONARY ANGIOPLASTY WITH STENT PLACEMENT     "I have 9 stents"   ESOPHAGOGASTRODUODENOSCOPY N/A 10/19/2014   Procedure: ESOPHAGOGASTRODUODENOSCOPY (EGD);  Surgeon: Jerene Bears, MD;  Location: [facility] ENDOSCOPY;  Service: Endoscopy;  Laterality: N/A;   FRACTURE SURGERY     FUSION OF TALONAVICULAR JOINT Right 04/15/2014   dr duda   GASTRIC BYPASS  02/20/2019   HERNIA REPAIR     umbilical   INGUINAL HERNIA REPAIR Right 05/11/2015   Procedure: LAPAROSCOPIC REPAIR RIGHT   INGUINAL HERNIA;  Surgeon: Greer Pickerel, MD;  Location: [place];  Service: General;  Laterality: Right;   INSERTION OF MESH Right 05/11/2015   Procedure: INSERTION OF MESH;  Surgeon: Greer Pickerel, MD;  Location: [place];  Service: General;  Laterality: Right;   JOINT REPLACEMENT     KNEE CARTILAGE SURGERY Right X 12   "~ 1/2 open; ~ 1/2 scopes"   KNEE CARTILAGE SURGERY Left X 3   "3 scopes"   LAPAROSCOPIC GASTRIC SLEEVE RESECTION N/A 03/03/2019   Procedure: LAPAROSCOPIC GASTRIC SLEEVE RESECTION, Upper Endo, ERAS Pathway;  Surgeon: Greer Pickerel, MD;  Location: [facility];  Service: General;  Laterality: N/A;   LEFT HEART CATHETERIZATION WITH CORONARY ANGIOGRAM N/A 02/10/2013   Procedure: LEFT HEART CATHETERIZATION WITH CORONARY ANGIOGRAM;  Surgeon: Burnell Blanks, MD;  Location[facility] CATH LAB;  Service: Cardiovascular;  Laterality: N/A;   LUMBAR LAMINECTOMY N/A 07/16/2018   Procedure: LEFT L4-5 REDO PARTIAL LUMBAR HEMILAMINECTOMY WITH FORAMINOTOMY LEFT L4;  Surgeon: Jessy Oto, MD;  Location: [place];  Service: Orthopedics;  Laterality: N/A;   LUMBAR LAMINECTOMY/DECOMPRESSION MICRODISCECTOMY N/A 01/27/2014   Procedure: CENTRAL LUMBAR LAMINECTOMY L4-5 AND L3-4;  Surgeon: Jessy Oto, MD;  Location: [place];  Service: Orthopedics;  Laterality: N/A;   ORIF ANKLE FRACTURE Right 09/02/2015   Procedure: OPEN REDUCTION INTERNAL FIXATION (ORIF) ANKLE FRACTURE;  Surgeon: Newt Minion, MD;  Location: [place];  Service: Orthopedics;  Laterality: Right;   PERIPHERALLY INSERTED CENTRAL CATHETER INSERTION  09/02/2015   POLYPECTOMY     ROTATOR CUFF REPAIR Left    x 2   ROTATOR CUFF REPAIR Right    x 3   SHOULDER ARTHROSCOPY W/ ROTATOR CUFF REPAIR Bilateral    "3 on the right; 1 on the left"   SKIN SPLIT GRAFT Right 10/01/2015   Procedure: RIGHT ANKLE APPLY SKIN GRAFT SPLIT THICKNESS;  Surgeon: Newt Minion, MD;  Location: [place];  Service: Orthopedics;  Laterality: Right;   TONSILLECTOMY     TOOTH EXTRACTION      TOTAL HIP ARTHROPLASTY Right 04/17/2018   TOTAL HIP ARTHROPLASTY Right 04/17/2018   Procedure: RIGHT TOTAL HIP ARTHROPLASTY;  Surgeon: Newt Minion, MD;  Location: [place];  Service: Orthopedics;  Laterality: Right;   TOTAL HIP ARTHROPLASTY Left 06/06/2019   Procedure: LEFT TOTAL HIP ARTHROPLASTY ANTERIOR APPROACH;  Surgeon: Mcarthur Rossetti, MD;  Location: [place];  Service: Orthopedics;  Laterality: Left;   TOTAL HIP REVISION Left 05/24/2019   TOTAL KNEE ARTHROPLASTY Bilateral 6433   UMBILICAL HERNIA REPAIR     UHR   UPPER GASTROINTESTINAL ENDOSCOPY  2016   URETHRAL DILATION  X 4   VASECTOMY     WISDOM TOOTH EXTRACTION      reports that he quit smoking about 10 years ago. His smoking use included cigars. He has never used smokeless tobacco. He reports current alcohol use. He reports that he does not use drugs. family history includes Breast cancer in his mother; Colon polyps in his brother; Coronary artery disease in an other family member; Depression in his brother, mother, and another family member; Diabetes in his father; Early death in his maternal grandfather, paternal grandfather, and son; Esophageal cancer in his mother; Healthy in his son; Heart attack (age of onset: 25) in his son; Heart attack (age of onset: 13) in his maternal grandfather; Heart disease in his father, maternal grandfather, and mother; Hyperlipidemia in his father; Hypertension in his brother, father, mother, and another family member; Pancreatic cancer in his mother; Prostate cancer in his father; Skin cancer in his father; Stomach cancer in his mother. No Known Allergies Current Outpatient Medications on File Prior to Visit  Medication Sig Dispense Refill   HYDROcodone-acetaminophen (NORCO) 10-325 MG tablet Take 1 tablet by mouth daily.     albuterol (VENTOLIN HFA) 108 (90 Base) MCG/ACT inhaler Inhale 2 puffs into the lungs every 6 (six) hours as needed for wheezing or shortness of breath. 18 g 1    ARIPiprazole (ABILIFY) 10 MG tablet Take 1 tablet (10 mg total) by mouth daily.  90 tablet 0   aspirin 81 MG chewable tablet Chew 1 tablet (81 mg total) by mouth daily. 35 tablet 0   atorvastatin (LIPITOR) 20 MG tablet Take 1 tablet (20 mg total) by mouth daily. (Patient taking differently: Take 20 mg by mouth daily.) 90 tablet 0   Azelastine-Fluticasone 137-50 MCG/ACT SUSP Use both nostrils as directed twice daily 23 g 11   benztropine (COGENTIN) 0.5 MG tablet Take 1 tablet (0.5 mg total) by mouth at bedtime. (Patient not taking: Reported on 12/01/2020) 90 tablet 0   calcium carbonate (TUMS - DOSED IN MG ELEMENTAL CALCIUM) 500 MG chewable tablet Chew 1 tablet by mouth 3 (three) times daily with meals.     celecoxib (CELEBREX) 200 MG capsule Take 1 capsule (200 mg total) by mouth 2 (two) times daily. 40 capsule 0   diclofenac Sodium (VOLTAREN) 1 % GEL Apply 4 g topically 3 (three) times daily as needed (pain). 350 g 5   donepezil (ARICEPT) 5 MG tablet Take 1 tablet (5 mg total) by mouth at bedtime. 90 tablet 3   DULoxetine (CYMBALTA) 20 MG capsule Take 20 mg by mouth daily.     DULoxetine 40 MG CPEP Take 40 mg by mouth 2 (two) times daily. 180 capsule 0   hydrochlorothiazide (HYDRODIURIL) 25 MG tablet Take 1 tablet (25 mg total) by mouth daily. (Patient taking differently: Take 25 mg by mouth daily.) 90 tablet 3   iron polysaccharides (NU-IRON) 150 MG capsule Take 1 capsule (150 mg total) by mouth daily. 90 capsule 1   losartan (COZAAR) 50 MG tablet Take 1 tablet (50 mg total) by mouth daily. 90 tablet 3   Melatonin 5 MG TABS Take 15 mg by mouth at bedtime.     methocarbamol (ROBAXIN) 500 MG tablet Take 1 tablet (500 mg total) by mouth every 8 (eight) hours as needed for muscle spasms. 90 tablet 1   Omega-3 Fatty Acids (FISH OIL) 1000 MG CAPS Take 1,000 mg by mouth at bedtime.      oxybutynin (DITROPAN-XL) 10 MG 24 hr tablet Take 10 mg by mouth daily.     pantoprazole (PROTONIX) 40 MG tablet Take 1  tablet (40 mg total) by mouth 2 (two) times daily. (Patient taking differently: Take 40 mg by mouth 2 (two) times daily before a meal.) 60 tablet 2   prasugrel (EFFIENT) 5 MG TABS tablet Take 1 tablet (5 mg total) by mouth daily. 90 tablet 3   pregabalin (LYRICA) 200 MG capsule Take 1 capsule (200 mg total) by mouth in the morning, at noon, and at bedtime. 60 capsule 0   sildenafil (VIAGRA) 100 MG tablet Take 100 mg by mouth daily as needed for erectile dysfunction.      tamsulosin (FLOMAX) 0.4 MG CAPS capsule Take 0.4 mg by mouth 2 (two) times daily.     Testosterone 1.62 % GEL Place 1 Pump onto the skin daily. 88 g 1   traZODone (DESYREL) 150 MG tablet Take 1 tablet (150 mg total) by mouth at bedtime. 90 tablet 0   ZENPEP 40000-126000 units CPEP Take 2 capsules (80,000 units) with meals and 1 capsule (40,000 units) with each snack (Patient taking differently: Take 1-2 capsules by mouth See admin instructions. Take 2 capsules (80,000 units) with meals and 1 capsule (40,000 units) with each snack) 720 capsule 1   zolpidem (AMBIEN) 10 MG tablet Take 1 tablet (10 mg total) by mouth at bedtime as needed for sleep. 90 tablet 1   No  current facility-administered medications on file prior to visit.        ROS:  All others reviewed and negative.  Objective        PE:  BP 134/78   Pulse (!) 52   Temp 98.3 F (36.8 C) (Oral)   Resp 18   Ht 6' (1.829 m)   Wt (!) 317 lb 3.2 oz (143.9 kg)   SpO2 96%   BMI 43.02 kg/m                 Constitutional: Pt appears in NAD               HENT: Head: NCAT.                Right Ear: External ear normal.                 Left Ear: External ear normal.                Eyes: . Pupils are equal, round, and reactive to light. Conjunctivae and EOM are normal               Nose: without d/c or deformity               Neck: Neck supple. Gross normal ROM               Cardiovascular: Normal rate and regular rhythm.                 Pulmonary/Chest: Effort normal and  breath sounds without rales or wheezing.                Abd:  Soft, NT, ND, + BS, no organomegaly               Neurological: Pt is alert. At baseline orientation, motor grossly intact               Skin: Skin is warm. No rashes, no other new lesions, LE edema - none               Psychiatric: Pt behavior is normal without agitation   Micro: none  Cardiac tracings I have personally interpreted today:  none  Pertinent Radiological findings (summarize): none   Lab Results  Component Value Date   WBC 3.4 (L) 12/01/2020   HGB 12.0 (L) 12/01/2020   HCT 35.4 (L) 12/01/2020   PLT 173.0 12/01/2020   GLUCOSE 84 12/01/2020   CHOL 113 12/01/2020   TRIG 85.0 12/01/2020   HDL 42.30 12/01/2020   LDLCALC 54 12/01/2020   ALT 11 12/01/2020   AST 22 12/01/2020   NA 139 12/01/2020   K 4.3 12/01/2020   CL 101 12/01/2020   CREATININE 1.23 12/01/2020   BUN 21 12/01/2020   CO2 32 12/01/2020   TSH 2.52 02/24/2020   PSA 0.56 07/24/2019   INR 1.1 06/03/2019   HGBA1C 6.1 12/01/2020   MICROALBUR <0.7 07/24/2019   Assessment/Plan:  Travis SERVISS Sr. is a 70 y.o. White or Caucasian [1] male with  has a past medical history of ALLERGIC RHINITIS (06/24/2009), Allergy, Anemia, Anxiety (11/12/2011), Arthritis, Arthritis of foot, right, degenerative (04/15/2014), Balance disorder (03/12/2013), Benign neoplasm of cecum, Benign neoplasm of descending colon, CAD (coronary artery disease) (06/24/2009), Chronic anticoagulation, Chronic pain syndrome (10/27/2009), Closed fracture of right foot (10/17/2014), CORONARY ARTERY DISEASE (06/24/2009), DEGENERATIVE JOINT DISEASE (06/24/2009), Depression, Depression with anxiety, DISC DISEASE, LUMBAR (04/19/2010), ERECTILE DYSFUNCTION, ORGANIC (05/30/2010), Essential hypertension (  06/24/2009), Fibromyalgia, Fracture dislocation of ankle joint (09/02/2015), Gait disorder (03/12/2013), General weakness (07/14/2014), GERD (gastroesophageal reflux disease) (09/08/2015), Hand joint pain  (06/10/2013), Heart murmur, Hepatitis C, History of kidney stones, Hyperlipidemia (07/15/2009), HYPERLIPIDEMIA-MIXED (07/15/2009), HYPERTENSION (06/24/2009), Insomnia (10/04/2011), Irregular heart beat, Left hip pain (03/12/2013), Major depression (09/13/2015), Myocardial infarction ([place]) (2008), Non-cardiac chest pain (10/2011, 01/2013), Obesity, Occult blood positive stool (10/17/2014), Open ankle fracture (09/02/2015), OSA (obstructive sleep apnea), Pain of right thumb (04/03/2013), Pneumonia, PPD positive (04/08/2015), Pre-ulcerative corn or callous (02/06/2013), Rotator cuff tear arthropathy of both shoulders (06/10/2013), SCIATICA, LEFT (04/19/2010), Sleep apnea, Spinal stenosis in cervical region (09/26/2013), Spinal stenosis, lumbar region, with neurogenic claudication (09/26/2013), Type II diabetes mellitus ([place]) (6644), Umbilical hernia, Uncontrolled type 2 DM with peripheral circulatory disorder ([place]) (10/04/2013), and URETHRAL STRICTURE (06/24/2009).  Vitamin D deficiency Last vitamin D Lab Results  Component Value Date   VD25OH 29.84 (L) 02/24/2020   Low, to start oral replacement   Chronic anemia Lab Results  Component Value Date   WBC 3.4 (L) 12/01/2020   HGB 12.0 (L) 12/01/2020   HCT 35.4 (L) 12/01/2020   MCV 83.8 12/01/2020   PLT 173.0 12/01/2020  stable, no overt bleeding, cont to monitor  Diabetes mellitus type 2 in obese [facility]) Lab Results  Component Value Date   HGBA1C 6.1 12/01/2020   Stable, pt to continue current medical treatment  - diet   Essential hypertension BP Readings from Last 3 Encounters:  12/01/20 134/78  10/27/20 (!) 150/77  10/11/20 (!) 152/78   Stable, pt to continue medical treatment  - losartan, hct   Memory loss or impairment Improved, apparently psychologically related,  to f/u any worsening symptoms or concerns  Followup: Return in about 6 months (around 06/03/2021).  [name], MD 12/05/2020 6:05 PM [facility]

## 2020-12-01 NOTE — Patient Instructions (Signed)

## 2020-12-01 NOTE — Assessment & Plan Note (Signed)
Last vitamin D Lab Results  Component Value Date   VD25OH 29.84 (L) 02/24/2020   Low, to start oral replacement

## 2020-12-02 ENCOUNTER — Encounter: Payer: Self-pay | Admitting: Internal Medicine

## 2020-12-02 DIAGNOSIS — M533 Sacrococcygeal disorders, not elsewhere classified: Secondary | ICD-10-CM | POA: Diagnosis not present

## 2020-12-02 DIAGNOSIS — Z79899 Other long term (current) drug therapy: Secondary | ICD-10-CM | POA: Diagnosis not present

## 2020-12-02 DIAGNOSIS — M961 Postlaminectomy syndrome, not elsewhere classified: Secondary | ICD-10-CM | POA: Diagnosis not present

## 2020-12-02 DIAGNOSIS — G894 Chronic pain syndrome: Secondary | ICD-10-CM | POA: Diagnosis not present

## 2020-12-02 LAB — CBC WITH DIFFERENTIAL/PLATELET
Basophils Absolute: 0 10*3/uL (ref 0.0–0.1)
Basophils Relative: 0.9 % (ref 0.0–3.0)
Eosinophils Absolute: 0.3 10*3/uL (ref 0.0–0.7)
Eosinophils Relative: 8.4 % — ABNORMAL HIGH (ref 0.0–5.0)
HCT: 35.4 % — ABNORMAL LOW (ref 39.0–52.0)
Hemoglobin: 12 g/dL — ABNORMAL LOW (ref 13.0–17.0)
Lymphocytes Relative: 36.1 % (ref 12.0–46.0)
Lymphs Abs: 1.2 10*3/uL (ref 0.7–4.0)
MCHC: 33.8 g/dL (ref 30.0–36.0)
MCV: 83.8 fl (ref 78.0–100.0)
Monocytes Absolute: 0.4 10*3/uL (ref 0.1–1.0)
Monocytes Relative: 10.8 % (ref 3.0–12.0)
Neutro Abs: 1.5 10*3/uL (ref 1.4–7.7)
Neutrophils Relative %: 43.8 % (ref 43.0–77.0)
Platelets: 173 10*3/uL (ref 150.0–400.0)
RBC: 4.22 Mil/uL (ref 4.22–5.81)
RDW: 15.5 % (ref 11.5–15.5)
WBC: 3.4 10*3/uL — ABNORMAL LOW (ref 4.0–10.5)

## 2020-12-05 ENCOUNTER — Encounter: Payer: Self-pay | Admitting: Internal Medicine

## 2020-12-05 NOTE — Assessment & Plan Note (Signed)
Lab Results  Component Value Date   WBC 3.4 (L) 12/01/2020   HGB 12.0 (L) 12/01/2020   HCT 35.4 (L) 12/01/2020   MCV 83.8 12/01/2020   PLT 173.0 12/01/2020  stable, no overt bleeding, cont to monitor

## 2020-12-05 NOTE — Assessment & Plan Note (Signed)
Lab Results  Component Value Date   HGBA1C 6.1 12/01/2020   Stable, pt to continue current medical treatment  - diet

## 2020-12-05 NOTE — Assessment & Plan Note (Signed)
BP Readings from Last 3 Encounters:  12/01/20 134/78  10/27/20 (!) 150/77  10/11/20 (!) 152/78   Stable, pt to continue medical treatment  - losartan, hct

## 2020-12-05 NOTE — Assessment & Plan Note (Signed)
Improved, apparently psychologically related,  to f/u any worsening symptoms or concerns

## 2020-12-06 ENCOUNTER — Other Ambulatory Visit: Payer: Self-pay | Admitting: Internal Medicine

## 2020-12-06 NOTE — Telephone Encounter (Signed)
Zolpidem  Last Visit: 12/01/20 Next Visit: 06/03/21 Last Filled: 09/07/20  PMP done; please advise

## 2020-12-07 ENCOUNTER — Ambulatory Visit (INDEPENDENT_AMBULATORY_CARE_PROVIDER_SITE_OTHER): Payer: Medicare HMO | Admitting: Family Medicine

## 2020-12-13 ENCOUNTER — Ambulatory Visit: Payer: Medicare Other | Admitting: Orthopedic Surgery

## 2020-12-15 ENCOUNTER — Other Ambulatory Visit: Payer: Self-pay

## 2020-12-15 ENCOUNTER — Encounter: Payer: Self-pay | Admitting: Internal Medicine

## 2020-12-15 ENCOUNTER — Ambulatory Visit: Payer: Self-pay

## 2020-12-15 ENCOUNTER — Ambulatory Visit (INDEPENDENT_AMBULATORY_CARE_PROVIDER_SITE_OTHER): Payer: Medicare Other | Admitting: Specialist

## 2020-12-15 ENCOUNTER — Telehealth: Payer: Self-pay | Admitting: Internal Medicine

## 2020-12-15 ENCOUNTER — Encounter: Payer: Self-pay | Admitting: Specialist

## 2020-12-15 VITALS — BP 109/73 | HR 65 | Ht 73.0 in | Wt 312.0 lb

## 2020-12-15 DIAGNOSIS — M4316 Spondylolisthesis, lumbar region: Secondary | ICD-10-CM | POA: Diagnosis not present

## 2020-12-15 DIAGNOSIS — M48062 Spinal stenosis, lumbar region with neurogenic claudication: Secondary | ICD-10-CM

## 2020-12-15 DIAGNOSIS — Z981 Arthrodesis status: Secondary | ICD-10-CM | POA: Diagnosis not present

## 2020-12-15 DIAGNOSIS — M533 Sacrococcygeal disorders, not elsewhere classified: Secondary | ICD-10-CM | POA: Diagnosis not present

## 2020-12-15 DIAGNOSIS — M47816 Spondylosis without myelopathy or radiculopathy, lumbar region: Secondary | ICD-10-CM | POA: Diagnosis not present

## 2020-12-15 NOTE — Patient Instructions (Signed)
Avoid frequent bending and stooping  No lifting greater than 10 lbs. May use ice or moist heat for pain. Weight loss is of benefit. Best medication for lumbar disc disease is arthritis medications like motrin, celebrex and naprosyn. Exercise is important to improve your indurance and does allow people to function better inspite of back pain. Your exam suggests that your SI joints are painful below your lumbar fusion. If the pain management physicians are allowing for injections of the SI joints I would recommend that they be done to  For diagnostic and therapeutic treatment of the SI joint pain. If there is substantial pain relief and the pain recurrs you may  Wish to consider radiofrequency ablation of the nerves innervating the SI joints as a way to provide a longer lasting Period of pain relief. Generally injections and PT directed at [facility] joint mobilization is the conservative way of treating the SI arthrosis. If these are not successful then you may consider fusion of the SI joints.

## 2020-12-15 NOTE — Chronic Care Management (AMB) (Signed)
  Chronic Care Management   Note  12/15/2020 Name: Travis HELDMAN Sr. MRN: [id number] DOB: [birth year]  Travis Novak Sr. is a 70 y.o. year old male who is a primary care patient of [name], MD. I reached out to [place] by phone today in response to a referral sent by [name]'s PCP, [name], MD.   [name] was given information about Chronic Care Management services today including:  CCM service includes personalized support from designated clinical staff supervised by his physician, including individualized plan of care and coordination with other care providers 24/7 contact phone numbers for assistance for urgent and routine care needs. Service will only be billed when office clinical staff spend 20 minutes or more in a month to coordinate care. Only one practitioner may furnish and bill the service in a calendar month. The patient may stop CCM services at any time (effective at the end of the month) by phone call to the office staff.   Patient agreed to services and verbal consent obtained.   Follow up plan:   Lauretta Grill [employer] Scheduler

## 2020-12-15 NOTE — Progress Notes (Signed)
Office Visit Note   Patient: Travis PRETLOW Sr.           Date of Birth: 05/19/1951           MRN: [id number] Visit Date: 12/15/2020              Requested by: [name], MD [place],  [place] [id number] PCP: [name], MD   Assessment & Plan: Visit Diagnoses:  1. S/P lumbar spinal fusion   2. Sacroiliac joint disease   3. Spondylolisthesis, lumbar region   4. Spinal stenosis of lumbar region with neurogenic claudication   5. Spondylosis without myelopathy or radiculopathy, lumbar region     Plan: Avoid frequent bending and stooping  No lifting greater than 10 lbs. May use ice or moist heat for pain. Weight loss is of benefit. Best medication for lumbar disc disease is arthritis medications like motrin, celebrex and naprosyn. Exercise is important to improve your indurance and does allow people to function better inspite of back pain. Your exam suggests that your SI joints are painful below your lumbar fusion. If the pain management physicians are allowing for injections of the SI joints I would recommend that they be done to  For diagnostic and therapeutic treatment of the SI joint pain. If there is substantial pain relief and the pain recurrs you may  Wish to consider radiofrequency ablation of the nerves innervating the SI joints as a way to provide a longer lasting Period of pain relief. Generally injections and PT directed at [facility] joint mobilization is the conservative way of treating the SI arthrosis. If these are not successful then you may consider fusion of the SI joints.    Follow-Up Instructions: No follow-ups on file.   Orders:  Orders Placed This Encounter  Procedures   XR Lumbar Spine 2-3 Views   No orders of the defined types were placed in this encounter.     Procedures: No procedures performed   Clinical Data: No additional findings.   Subjective: Chief Complaint  Patient presents with   Lower Back - Routine Post Op     Approx. 5 months post TLIF L35-S65    70 year old male with history of back pain and spondylosis with multiple level DDD. He has surgical foraminotomies with extension of fusion to L1 posteriorly. Unable to enter disc spaces due to tightness and he has been  Experiencing pain into the buttocks both sides left more than right. Sees pain management in [place] and they are asking if he will allow for SI joint injections. Pain with standing and walking improves with lying down but not improved with prolong sitting.. Surgery was about 5 months ago.    Review of Systems  Constitutional: Negative.   HENT:  Positive for congestion, rhinorrhea, sinus pressure and sinus pain.   Eyes: Negative.   Respiratory:  Positive for cough (went away and now it is back).   Cardiovascular:  Negative for chest pain and leg swelling.  Gastrointestinal: Negative.  Negative for abdominal distention, abdominal pain, anal bleeding, blood in stool, constipation, diarrhea, nausea, rectal pain and vomiting.  Endocrine: Negative.   Genitourinary: Negative.   Musculoskeletal:  Positive for back pain.  Skin: Negative.   Allergic/Immunologic: Negative.   Neurological: Negative.   Hematological: Negative.   Psychiatric/Behavioral: Negative.      Objective: Vital Signs: BP 109/73 (BP Location: Left Arm, Patient Position: Sitting)   Pulse 65   Ht 6' 1"  (1.854  m)   Wt (!) 312 lb (141.5 kg)   BMI 41.16 kg/m   Physical Exam Constitutional:      Appearance: He is well-developed.  HENT:     Head: Normocephalic and atraumatic.  Eyes:     Pupils: Pupils are equal, round, and reactive to light.  Pulmonary:     Effort: Pulmonary effort is normal.     Breath sounds: Normal breath sounds.  Abdominal:     General: Bowel sounds are normal.     Palpations: Abdomen is soft.  Musculoskeletal:        General: Normal range of motion.     Cervical back: Normal range of motion and neck supple.  Skin:    General: Skin is warm and  dry.  Neurological:     Mental Status: He is alert and oriented to person, place, and time.  Psychiatric:        Behavior: Behavior normal.        Thought Content: Thought content normal.        Judgment: Judgment normal.    Ortho Exam  Specialty Comments:  No specialty comments available.  Imaging: No results found.   PMFS History: Patient Active Problem List   Diagnosis Date Noted   Other spondylosis with radiculopathy, lumbar region 07/16/2018    Priority: High    Class: Chronic   Spinal stenosis in cervical region 09/26/2013    Priority: High    Class: Chronic   Congenital spinal stenosis of lumbar region 09/26/2013    Priority: High    Class: Chronic   Sinusitis 11/02/2020   Memory loss or impairment 10/11/2020   Right arm pain 09/18/2020   Aortic atherosclerosis ([place]) 09/16/2020   Spondylolisthesis, lumbar region    Status post lumbar spinal fusion 07/27/2020   Nasal sinus polyp 06/16/2020   Vitamin D deficiency 02/24/2020   Cough 07/29/2019   Wheezing 07/29/2019   Possible exposure to STD 07/24/2019   Unilateral primary osteoarthritis, left hip 06/06/2019   Status post total replacement of left hip 06/06/2019   S/P laparoscopic sleeve gastrectomy 03/03/2019   Rash 01/24/2019   Fever 08/15/2018   UTI (urinary tract infection) 08/15/2018   Fall at home, initial encounter 08/15/2018   Chronic anemia 08/15/2018   Hypokalemia 08/15/2018   Bowel incontinence 07/25/2018   Status post lumbar laminectomy 07/16/2018   Status post THR (total hip replacement) 04/17/2018   Unilateral primary osteoarthritis, right hip    Morbid obesity with BMI of 40.0-44.9, adult ([place]) 02/28/2018   Myocardial infarction ([place]) 02/25/2018   Coronary artery disease involving native coronary artery of native heart 02/25/2018   PAD (peripheral artery disease) ([place]) 02/25/2018   S/P BKA (below knee amputation) unilateral, right ([place]) 02/25/2018   Sacroiliitis ([place]) 02/25/2018   SOB  (shortness of breath) 01/03/2018   Acute on chronic heart failure ([place]) 01/03/2018   Severe right groin pain 12/20/2017   Morbid obesity ([place]) 10/09/2017   Low back pain 07/13/2017   Leukoplakia, tongue 01/26/2017   Skin lesion 10/20/2016   S/P unilateral BKA (below knee amputation), right ([place]) 06/14/2016   Charcot foot due to diabetes mellitus ([place])    Charcot's arthropathy associated with type 2 diabetes mellitus ([place]) 04/11/2016   Encounter for well adult exam with abnormal findings 11/05/2015   Major depression 09/13/2015   S/P TKR (total knee replacement) bilaterally 09/13/2015   GERD (gastroesophageal reflux disease) 09/08/2015   S/P laparoscopic hernia repair 05/11/2015   PPD positive 04/08/2015   Benign  neoplasm of descending colon    Benign neoplasm of cecum    Acute blood loss as cause of postoperative anemia    Chronic anticoagulation    Occult blood positive stool 10/17/2014   General weakness 07/14/2014   Urinary incontinence 07/14/2014   Headache(784.0) 10/15/2013   Hand joint pain 06/10/2013   Rotator cuff tear arthropathy of both shoulders 06/10/2013   Skin lesion of cheek 05/01/2013   Pain of right thumb 04/03/2013   Balance disorder 03/12/2013   Gait disorder 03/12/2013   Tremor 03/12/2013   Left hip pain 03/12/2013   Pre-ulcerative corn or callous 02/06/2013   Anxiety 11/12/2011   OSA on CPAP 11/07/2011   Bradycardia 10/20/2011   Insomnia 10/04/2011   Obesity 01/12/2011   Diabetes mellitus type 2 in obese ([place]) 09/27/2010   ERECTILE DYSFUNCTION, ORGANIC 05/30/2010   Degenerative disc disease, lumbar 04/19/2010   SCIATICA, LEFT 04/19/2010   Chronic pain syndrome 10/27/2009   Hyperlipidemia 07/15/2009   Essential hypertension 06/24/2009   Coronary artery disease involving native coronary artery of native heart without angina pectoris 06/24/2009   Allergic rhinitis 06/24/2009   URETHRAL STRICTURE 06/24/2009   DEGENERATIVE JOINT DISEASE 06/24/2009    SHOULDER PAIN, BILATERAL 06/24/2009   FATIGUE 06/24/2009   NEPHROLITHIASIS, HX OF 06/24/2009   Past Medical History:  Diagnosis Date   ALLERGIC RHINITIS 06/24/2009   Allergy    Anemia    IDA   Anxiety 11/12/2011   Adequate for discharge    Arthritis    "all my joints" (09/30/2013)   Arthritis of foot, right, degenerative 04/15/2014   Balance disorder 03/12/2013   Benign neoplasm of cecum    Benign neoplasm of descending colon    CAD (coronary artery disease) 06/24/2009   5 stents placed in 2007     Chronic anticoagulation    Chronic pain syndrome 10/27/2009   of ankle, shoulders, low back.  sciatica.    Closed fracture of right foot 10/17/2014   CORONARY ARTERY DISEASE 06/24/2009   a. s/p multiple PCIs - In 2008 he had a Taxus DES to the mild LAD, Endeavor DES to mid LCX and distal LCX. In January 2009 he had DES to distal LCX, mid LCX and proximal LCX. In November 2009 had BMS x 2 to the mid RCA. Cath 10/2011 with patent stents, noncardiac CP. LHC 01/2013: patent stents (noncardiac CP).   DEGENERATIVE JOINT DISEASE 06/24/2009   Qualifier: Diagnosis of  By: [name], [name]    Depression    Depression with anxiety    Prior suicide attempt(08/25/19-pt states not suicide attempt)   Drain DISEASE, LUMBAR 04/19/2010   ERECTILE DYSFUNCTION, ORGANIC 05/30/2010   Essential hypertension 06/24/2009   Qualifier: Diagnosis of  By: [name], [name]    Fibromyalgia    Fracture dislocation of ankle joint 09/02/2015   Gait disorder 03/12/2013   General weakness 07/14/2014   GERD (gastroesophageal reflux disease) 09/08/2015   08/25/15-pt states was cardiac origin, not GERD   Hand joint pain 06/10/2013   Heart murmur    Hepatitis C    treated pt. unknown with what he was a teenager   History of kidney stones    Hyperlipidemia 07/15/2009   Qualifier: Diagnosis of  By: Aundra Dubin, MD, [place]     HYPERLIPIDEMIA-MIXED 07/15/2009   08/25/19-pt state cholesterol was normal range   HYPERTENSION 06/24/2009   Insomnia 10/04/2011    Irregular heart beat    Left hip pain 03/12/2013   Injected under ultrasound guidance  on June 24, 2013    Major depression 09/13/2015   Myocardial infarction [facility]) 2008   Non-cardiac chest pain 10/2011, 01/2013   Obesity    Occult blood positive stool 10/17/2014   Open ankle fracture 09/02/2015   OSA (obstructive sleep apnea)    not using CPAP (09/30/2013)   Pain of right thumb 04/03/2013   Pneumonia    PPD positive 04/08/2015   Pre-ulcerative corn or callous 02/06/2013   Rotator cuff tear arthropathy of both shoulders 06/10/2013   History of bilateral shoulder cuff surgery for rotator cuff tears. Reports increase in pain 09/11/2015 during physical therapy of the left shoulder.    SCIATICA, LEFT 04/19/2010   Qualifier: Diagnosis of  By: [name], [name]    Sleep apnea    wears cpap 08/25/19-States not using due to nasal stuffiness.   Spinal stenosis in cervical region 09/26/2013   Spinal stenosis, lumbar region, with neurogenic claudication 09/26/2013   Type II diabetes mellitus ([place]) 2012   no meds in 09/2014.    Umbilical hernia    Uncontrolled type 2 DM with peripheral circulatory disorder ([place]) 10/04/2013   08/25/19- pt states A1C normal, no diabetes   URETHRAL STRICTURE 06/24/2009   self catheterizes.     Family History  Problem Relation Age of Onset   Depression Mother    Heart disease Mother    Hypertension Mother    Breast cancer Mother        primary cancer   Stomach cancer Mother    Esophageal cancer Mother    Pancreatic cancer Mother    Diabetes Father    Heart disease Father        CABG   Hypertension Father    Hyperlipidemia Father    Prostate cancer Father    Skin cancer Father    Depression Brother        x 2   Hypertension Brother        x2   Colon polyps Brother    Heart attack Son 59   Early death Son    Heart disease Maternal Grandfather    Early death Maternal Grandfather    Heart attack Maternal Grandfather 63   Early death Paternal Grandfather     Coronary artery disease Other    Hypertension Other    Depression Other    Healthy Son    Colon cancer Neg Hx    Rectal cancer Neg Hx     Past Surgical History:  Procedure Laterality Date   AMPUTATION Right 06/14/2016   Procedure: AMPUTATION BELOW KNEE;  Surgeon: Newt Minion, MD;  Location: [place];  Service: Orthopedics;  Laterality: Right;   ANKLE FUSION Right 04/15/2014   Procedure: Right Subtalar, Talonavicular Fusion;  Surgeon: Newt Minion, MD;  Location: [place];  Service: Orthopedics;  Laterality: Right;   ANKLE FUSION Right 04/18/2016   Procedure: Right Ankle Tibiocalcaneal Fusion;  Surgeon: Newt Minion, MD;  Location: [place];  Service: Orthopedics;  Laterality: Right;   ANTERIOR CERVICAL DECOMP/DISCECTOMY FUSION N/A 09/26/2013   Procedure: ANTERIOR CERVICAL DISCECTOMY FUSION C3-4, plate and screw fixation, allograft bone graft;  Surgeon: Jessy Oto, MD;  Location: [place];  Service: Orthopedics;  Laterality: N/A;   BACK SURGERY     3   BELOW KNEE LEG AMPUTATION Right 06/14/2016   right ankle and foot   CARDIAC CATHETERIZATION  X 1   CARPAL TUNNEL RELEASE Bilateral    COLONOSCOPY N/A 10/22/2014   Procedure: COLONOSCOPY;  Surgeon: Lafayette Dragon, MD;  Location: [facility] ENDOSCOPY;  Service: Endoscopy;  Laterality: N/A;   COLONOSCOPY  11/19/2018   CORONARY ANGIOPLASTY WITH STENT PLACEMENT     "I have 9 stents"   ESOPHAGOGASTRODUODENOSCOPY N/A 10/19/2014   Procedure: ESOPHAGOGASTRODUODENOSCOPY (EGD);  Surgeon: Jerene Bears, MD;  Location: [facility] ENDOSCOPY;  Service: Endoscopy;  Laterality: N/A;   FRACTURE SURGERY     FUSION OF TALONAVICULAR JOINT Right 04/15/2014   dr duda   GASTRIC BYPASS  02/20/2019   HERNIA REPAIR     umbilical   INGUINAL HERNIA REPAIR Right 05/11/2015   Procedure: LAPAROSCOPIC REPAIR RIGHT  INGUINAL HERNIA;  Surgeon: Greer Pickerel, MD;  Location: [place];  Service: General;  Laterality: Right;   INSERTION OF MESH Right 05/11/2015   Procedure: INSERTION OF MESH;  Surgeon:  Greer Pickerel, MD;  Location: [place];  Service: General;  Laterality: Right;   JOINT REPLACEMENT     KNEE CARTILAGE SURGERY Right X 12   "~ 1/2 open; ~ 1/2 scopes"   KNEE CARTILAGE SURGERY Left X 3   "3 scopes"   LAPAROSCOPIC GASTRIC SLEEVE RESECTION N/A 03/03/2019   Procedure: LAPAROSCOPIC GASTRIC SLEEVE RESECTION, Upper Endo, ERAS Pathway;  Surgeon: Greer Pickerel, MD;  Location: [facility];  Service: General;  Laterality: N/A;   LEFT HEART CATHETERIZATION WITH CORONARY ANGIOGRAM N/A 02/10/2013   Procedure: LEFT HEART CATHETERIZATION WITH CORONARY ANGIOGRAM;  Surgeon: Burnell Blanks, MD;  Location: [facility] CATH LAB;  Service: Cardiovascular;  Laterality: N/A;   LUMBAR LAMINECTOMY N/A 07/16/2018   Procedure: LEFT L4-5 REDO PARTIAL LUMBAR HEMILAMINECTOMY WITH FORAMINOTOMY LEFT L4;  Surgeon: Jessy Oto, MD;  Location: [place];  Service: Orthopedics;  Laterality: N/A;   LUMBAR LAMINECTOMY/DECOMPRESSION MICRODISCECTOMY N/A 01/27/2014   Procedure: CENTRAL LUMBAR LAMINECTOMY L4-5 AND L3-4;  Surgeon: Jessy Oto, MD;  Location: [place];  Service: Orthopedics;  Laterality: N/A;   ORIF ANKLE FRACTURE Right 09/02/2015   Procedure: OPEN REDUCTION INTERNAL FIXATION (ORIF) ANKLE FRACTURE;  Surgeon: Newt Minion, MD;  Location: [place];  Service: Orthopedics;  Laterality: Right;   PERIPHERALLY INSERTED CENTRAL CATHETER INSERTION  09/02/2015   POLYPECTOMY     ROTATOR CUFF REPAIR Left    x 2   ROTATOR CUFF REPAIR Right    x 3   SHOULDER ARTHROSCOPY W/ ROTATOR CUFF REPAIR Bilateral    "3 on the right; 1 on the left"   SKIN SPLIT GRAFT Right 10/01/2015   Procedure: RIGHT ANKLE APPLY SKIN GRAFT SPLIT THICKNESS;  Surgeon: Newt Minion, MD;  Location: [place];  Service: Orthopedics;  Laterality: Right;   TONSILLECTOMY     TOOTH EXTRACTION     TOTAL HIP ARTHROPLASTY Right 04/17/2018   TOTAL HIP ARTHROPLASTY Right 04/17/2018   Procedure: RIGHT TOTAL HIP ARTHROPLASTY;  Surgeon: Newt Minion, MD;  Location: [place];   Service: Orthopedics;  Laterality: Right;   TOTAL HIP ARTHROPLASTY Left 06/06/2019   Procedure: LEFT TOTAL HIP ARTHROPLASTY ANTERIOR APPROACH;  Surgeon: Mcarthur Rossetti, MD;  Location: [place];  Service: Orthopedics;  Laterality: Left;   TOTAL HIP REVISION Left 05/24/2019   TOTAL KNEE ARTHROPLASTY Bilateral 4403   UMBILICAL HERNIA REPAIR     UHR   UPPER GASTROINTESTINAL ENDOSCOPY  2016   URETHRAL DILATION  X 4   VASECTOMY     WISDOM TOOTH EXTRACTION     Social History   Occupational History   Occupation: disabled since 2006 due to ortho. heart, psych  Employer: UNEMPLOYED   Occupation: part time work as an Multimedia programmer, wrestling, and Holiday representative  Tobacco Use   Smoking status: Former    Types: Cigars    Quit date: 08/28/2010    Years since quitting: 10.3   Smokeless tobacco: Never   Tobacco comments:    04/18/2016 "smoked 1 cigar/wk when I did smoke"  Vaping Use   Vaping Use: Never used  Substance and Sexual Activity   Alcohol use: Yes    Alcohol/week: 0.0 standard drinks    Comment: occasionally   Drug use: No   Sexual activity: Not Currently

## 2020-12-17 ENCOUNTER — Ambulatory Visit (INDEPENDENT_AMBULATORY_CARE_PROVIDER_SITE_OTHER): Payer: Medicare Other | Admitting: Psychology

## 2020-12-17 DIAGNOSIS — F331 Major depressive disorder, recurrent, moderate: Secondary | ICD-10-CM | POA: Diagnosis not present

## 2020-12-20 DIAGNOSIS — M961 Postlaminectomy syndrome, not elsewhere classified: Secondary | ICD-10-CM | POA: Diagnosis not present

## 2020-12-20 DIAGNOSIS — M533 Sacrococcygeal disorders, not elsewhere classified: Secondary | ICD-10-CM | POA: Diagnosis not present

## 2020-12-20 DIAGNOSIS — G894 Chronic pain syndrome: Secondary | ICD-10-CM | POA: Diagnosis not present

## 2020-12-21 ENCOUNTER — Ambulatory Visit (INDEPENDENT_AMBULATORY_CARE_PROVIDER_SITE_OTHER): Payer: Medicare HMO | Admitting: Family Medicine

## 2020-12-22 ENCOUNTER — Telehealth (INDEPENDENT_AMBULATORY_CARE_PROVIDER_SITE_OTHER): Payer: Medicare Other | Admitting: Internal Medicine

## 2020-12-22 ENCOUNTER — Encounter: Payer: Self-pay | Admitting: Internal Medicine

## 2020-12-22 DIAGNOSIS — I10 Essential (primary) hypertension: Secondary | ICD-10-CM

## 2020-12-22 DIAGNOSIS — E669 Obesity, unspecified: Secondary | ICD-10-CM

## 2020-12-22 DIAGNOSIS — R062 Wheezing: Secondary | ICD-10-CM

## 2020-12-22 DIAGNOSIS — R059 Cough, unspecified: Secondary | ICD-10-CM

## 2020-12-22 DIAGNOSIS — E1169 Type 2 diabetes mellitus with other specified complication: Secondary | ICD-10-CM | POA: Diagnosis not present

## 2020-12-22 MED ORDER — PREDNISONE 10 MG PO TABS: ORAL_TABLET | ORAL | 0 refills | Status: DC

## 2020-12-22 MED ORDER — HYDROCODONE BIT-HOMATROP MBR 5-1.5 MG/5ML PO SOLN: 5.0000 mL | Freq: Four times a day (QID) | ORAL | 0 refills | Status: AC | PRN

## 2020-12-22 NOTE — Patient Instructions (Signed)
Please take all new medication as prescribed  Please go to the XRAY Department at the [facility] xray for the x-ray testing

## 2020-12-22 NOTE — Assessment & Plan Note (Signed)
Mild to mod, I suspect new onset mild intemittent asthma, for predpac course,  to f/u any worsening symptoms or concerns

## 2020-12-22 NOTE — Assessment & Plan Note (Signed)
Mild to mod, for cxr r/o pna, for cough med prn, consider antibx pending cxr,  to f/u any worsening symptoms or concerns

## 2020-12-22 NOTE — Progress Notes (Signed)
Patient ID: Travis [name]., male   DOB: 10/26/1950, 70 y.o.   MRN: [id number]  Virtual Visit via Video Note  I connected with Travis Novak Sr. on 12/22/20 at  1:20 PM EDT by a video enabled telemedicine application and verified that I am speaking with the correct person using two identifiers.  Location of all participants today Patient: at home Provider: at office   I discussed the limitations of evaluation and management by telemedicine and the availability of in person appointments. The patient expressed understanding and agreed to proceed.  History of Present Illness: Here to f/u with 4 days onset worsening mild non proc cough with mild wheezing but little sob over baseline and Pt denies chest pain, orthopnea, PND, increased LE swelling, palpitations, dizziness or syncope.   Pt denies fever, wt loss, night sweats, loss of appetite, or other constitutional symptoms, though has some fatigue and general weakness.  No prior hx of asthma, though has long hx of allergic rhintis.  Denies worsening reflux, abd pain, dysphagia, n/v, bowel change or blood.     Past Medical History:  Diagnosis Date   ALLERGIC RHINITIS 06/24/2009   Allergy    Anemia    IDA   Anxiety 11/12/2011   Adequate for discharge    Arthritis    "all my joints" (09/30/2013)   Arthritis of foot, right, degenerative 04/15/2014   Balance disorder 03/12/2013   Benign neoplasm of cecum    Benign neoplasm of descending colon    CAD (coronary artery disease) 06/24/2009   5 stents placed in 2007     Chronic anticoagulation    Chronic pain syndrome 10/27/2009   of ankle, shoulders, low back.  sciatica.    Closed fracture of right foot 10/17/2014   CORONARY ARTERY DISEASE 06/24/2009   a. s/p multiple PCIs - In 2008 he had a Taxus DES to the mild LAD, Endeavor DES to mid LCX and distal LCX. In January 2009 he had DES to distal LCX, mid LCX and proximal LCX. In November 2009 had BMS x 2 to the mid RCA. Cath 10/2011 with patent  stents, noncardiac CP. LHC 01/2013: patent stents (noncardiac CP).   DEGENERATIVE JOINT DISEASE 06/24/2009   Qualifier: Diagnosis of  By: [name], [name]    Depression    Depression with anxiety    Prior suicide attempt(08/25/19-pt states not suicide attempt)   [place] DISEASE, LUMBAR 04/19/2010   ERECTILE DYSFUNCTION, ORGANIC 05/30/2010   Essential hypertension 06/24/2009   Qualifier: Diagnosis of  By: [name], [name]    Fibromyalgia    Fracture dislocation of ankle joint 09/02/2015   Gait disorder 03/12/2013   General weakness 07/14/2014   GERD (gastroesophageal reflux disease) 09/08/2015   08/25/15-pt states was cardiac origin, not GERD   Hand joint pain 06/10/2013   Heart murmur    Hepatitis C    treated pt. unknown with what he was a teenager   History of kidney stones    Hyperlipidemia 07/15/2009   Qualifier: Diagnosis of  By: Aundra Dubin, MD, [place]     HYPERLIPIDEMIA-MIXED 07/15/2009   08/25/19-pt state cholesterol was normal range   HYPERTENSION 06/24/2009   Insomnia 10/04/2011   Irregular heart beat    Left hip pain 03/12/2013   Injected under ultrasound guidance on June 24, 2013    Major depression 09/13/2015   Myocardial infarction [facility]) 2008   Non-cardiac chest pain 10/2011, 01/2013   Obesity    Occult blood positive stool 10/17/2014  Open ankle fracture 09/02/2015   OSA (obstructive sleep apnea)    not using CPAP (09/30/2013)   Pain of right thumb 04/03/2013   Pneumonia    PPD positive 04/08/2015   Pre-ulcerative corn or callous 02/06/2013   Rotator cuff tear arthropathy of both shoulders 06/10/2013   History of bilateral shoulder cuff surgery for rotator cuff tears. Reports increase in pain 09/11/2015 during physical therapy of the left shoulder.    SCIATICA, LEFT 04/19/2010   Qualifier: Diagnosis of  By: [name], [name]    Sleep apnea    wears cpap 08/25/19-States not using due to nasal stuffiness.   Spinal stenosis in cervical region 09/26/2013   Spinal stenosis, lumbar region, with  neurogenic claudication 09/26/2013   Type II diabetes mellitus ([place]) 2012   no meds in 09/2014.    Umbilical hernia    Uncontrolled type 2 DM with peripheral circulatory disorder ([place]) 10/04/2013   08/25/19- pt states A1C normal, no diabetes   URETHRAL STRICTURE 06/24/2009   self catheterizes.    Past Surgical History:  Procedure Laterality Date   AMPUTATION Right 06/14/2016   Procedure: AMPUTATION BELOW KNEE;  Surgeon: Newt Minion, MD;  Location: [place];  Service: Orthopedics;  Laterality: Right;   ANKLE FUSION Right 04/15/2014   Procedure: Right Subtalar, Talonavicular Fusion;  Surgeon: Newt Minion, MD;  Location: [place];  Service: Orthopedics;  Laterality: Right;   ANKLE FUSION Right 04/18/2016   Procedure: Right Ankle Tibiocalcaneal Fusion;  Surgeon: Newt Minion, MD;  Location: [place];  Service: Orthopedics;  Laterality: Right;   ANTERIOR CERVICAL DECOMP/DISCECTOMY FUSION N/A 09/26/2013   Procedure: ANTERIOR CERVICAL DISCECTOMY FUSION C3-4, plate and screw fixation, allograft bone graft;  Surgeon: Jessy Oto, MD;  Location: [place];  Service: Orthopedics;  Laterality: N/A;   BACK SURGERY     3   BELOW KNEE LEG AMPUTATION Right 06/14/2016   right ankle and foot   CARDIAC CATHETERIZATION  X 1   CARPAL TUNNEL RELEASE Bilateral    COLONOSCOPY N/A 10/22/2014   Procedure: COLONOSCOPY;  Surgeon: Lafayette Dragon, MD;  Location[facility] ENDOSCOPY;  Service: Endoscopy;  Laterality: N/A;   COLONOSCOPY  11/19/2018   CORONARY ANGIOPLASTY WITH STENT PLACEMENT     "I have 9 stents"   ESOPHAGOGASTRODUODENOSCOPY N/A 10/19/2014   Procedure: ESOPHAGOGASTRODUODENOSCOPY (EGD);  Surgeon: Jerene Bears, MD;  Location: [facility] ENDOSCOPY;  Service: Endoscopy;  Laterality: N/A;   FRACTURE SURGERY     FUSION OF TALONAVICULAR JOINT Right 04/15/2014   dr duda   GASTRIC BYPASS  02/20/2019   HERNIA REPAIR     umbilical   INGUINAL HERNIA REPAIR Right 05/11/2015   Procedure: LAPAROSCOPIC REPAIR RIGHT  INGUINAL HERNIA;  Surgeon:  Greer Pickerel, MD;  Location: [place];  Service: General;  Laterality: Right;   INSERTION OF MESH Right 05/11/2015   Procedure: INSERTION OF MESH;  Surgeon: Greer Pickerel, MD;  Location: [place];  Service: General;  Laterality: Right;   JOINT REPLACEMENT     KNEE CARTILAGE SURGERY Right X 12   "~ 1/2 open; ~ 1/2 scopes"   KNEE CARTILAGE SURGERY Left X 3   "3 scopes"   LAPAROSCOPIC GASTRIC SLEEVE RESECTION N/A 03/03/2019   Procedure: LAPAROSCOPIC GASTRIC SLEEVE RESECTION, Upper Endo, ERAS Pathway;  Surgeon: Greer Pickerel, MD;  Location: [facility];  Service: General;  Laterality: N/A;   LEFT HEART CATHETERIZATION WITH CORONARY ANGIOGRAM N/A 02/10/2013   Procedure: LEFT HEART CATHETERIZATION WITH CORONARY ANGIOGRAM;  Surgeon: Burnell Blanks, MD;  [facility] C[facility] LAB;  Service: Cardiovascular;  Laterality: N/A;   LUMBAR LAMINECTOMY N/A 07/16/2018   Procedure: LEFT L4-5 REDO PARTIAL LUMBAR HEMILAMINECTOMY WITH FORAMINOTOMY LEFT L4;  Surgeon: Jessy Oto, MD;  Location: [place];  Service: Orthopedics;  Laterality: N/A;   LUMBAR LAMINECTOMY/DECOMPRESSION MICRODISCECTOMY N/A 01/27/2014   Procedure: CENTRAL LUMBAR LAMINECTOMY L4-5 AND L3-4;  Surgeon: Jessy Oto, MD;  Location: [place];  Service: Orthopedics;  Laterality: N/A;   ORIF ANKLE FRACTURE Right 09/02/2015   Procedure: OPEN REDUCTION INTERNAL FIXATION (ORIF) ANKLE FRACTURE;  Surgeon: Newt Minion, MD;  Location: [place];  Service: Orthopedics;  Laterality: Right;   PERIPHERALLY INSERTED CENTRAL CATHETER INSERTION  09/02/2015   POLYPECTOMY     ROTATOR CUFF REPAIR Left    x 2   ROTATOR CUFF REPAIR Right    x 3   SHOULDER ARTHROSCOPY W/ ROTATOR CUFF REPAIR Bilateral    "3 on the right; 1 on the left"   SKIN SPLIT GRAFT Right 10/01/2015   Procedure: RIGHT ANKLE APPLY SKIN GRAFT SPLIT THICKNESS;  Surgeon: Newt Minion, MD;  Location: [place];  Service: Orthopedics;  Laterality: Right;   TONSILLECTOMY     TOOTH EXTRACTION     TOTAL HIP ARTHROPLASTY  Right 04/17/2018   TOTAL HIP ARTHROPLASTY Right 04/17/2018   Procedure: RIGHT TOTAL HIP ARTHROPLASTY;  Surgeon: Newt Minion, MD;  Location: [place];  Service: Orthopedics;  Laterality: Right;   TOTAL HIP ARTHROPLASTY Left 06/06/2019   Procedure: LEFT TOTAL HIP ARTHROPLASTY ANTERIOR APPROACH;  Surgeon: Mcarthur Rossetti, MD;  Location: [place];  Service: Orthopedics;  Laterality: Left;   TOTAL HIP REVISION Left 05/24/2019   TOTAL KNEE ARTHROPLASTY Bilateral 9201   UMBILICAL HERNIA REPAIR     UHR   UPPER GASTROINTESTINAL ENDOSCOPY  2016   URETHRAL DILATION  X 4   VASECTOMY     WISDOM TOOTH EXTRACTION      reports that he quit smoking about 10 years ago. His smoking use included cigars. He has never used smokeless tobacco. He reports current alcohol use. He reports that he does not use drugs. family history includes Breast cancer in his mother; Colon polyps in his brother; Coronary artery disease in an other family member; Depression in his brother, mother, and another family member; Diabetes in his father; Early death in his maternal grandfather, paternal grandfather, and son; Esophageal cancer in his mother; Healthy in his son; Heart attack (age of onset: 11) in his son; Heart attack (age of onset: 76) in his maternal grandfather; Heart disease in his father, maternal grandfather, and mother; Hyperlipidemia in his father; Hypertension in his brother, father, mother, and another family member; Pancreatic cancer in his mother; Prostate cancer in his father; Skin cancer in his father; Stomach cancer in his mother. No Known Allergies Current Outpatient Medications on File Prior to Visit  Medication Sig Dispense Refill   albuterol (VENTOLIN HFA) 108 (90 Base) MCG/ACT inhaler Inhale 2 puffs into the lungs every 6 (six) hours as needed for wheezing or shortness of breath. 18 g 1   ARIPiprazole (ABILIFY) 10 MG tablet Take 1 tablet (10 mg total) by mouth daily. 90 tablet 0   aspirin 81 MG chewable  tablet Chew 1 tablet (81 mg total) by mouth daily. 35 tablet 0   atorvastatin (LIPITOR) 20 MG tablet Take 1 tablet (20 mg total) by mouth daily. (Patient taking differently: Take 20 mg by mouth daily.) 90 tablet  0   Azelastine-Fluticasone 137-50 MCG/ACT SUSP Use both nostrils as directed twice daily 23 g 11   benztropine (COGENTIN) 0.5 MG tablet Take 1 tablet (0.5 mg total) by mouth at bedtime. (Patient not taking: Reported on 12/01/2020) 90 tablet 0   calcium carbonate (TUMS - DOSED IN MG ELEMENTAL CALCIUM) 500 MG chewable tablet Chew 1 tablet by mouth 3 (three) times daily with meals.     celecoxib (CELEBREX) 200 MG capsule Take 1 capsule (200 mg total) by mouth 2 (two) times daily. 40 capsule 0   diclofenac Sodium (VOLTAREN) 1 % GEL Apply 4 g topically 3 (three) times daily as needed (pain). 350 g 5   donepezil (ARICEPT) 5 MG tablet Take 1 tablet (5 mg total) by mouth at bedtime. 90 tablet 3   DULoxetine (CYMBALTA) 20 MG capsule Take 20 mg by mouth daily.     DULoxetine 40 MG CPEP Take 40 mg by mouth 2 (two) times daily. 180 capsule 0   hydrochlorothiazide (HYDRODIURIL) 25 MG tablet Take 1 tablet (25 mg total) by mouth daily. (Patient taking differently: Take 25 mg by mouth daily.) 90 tablet 3   HYDROcodone-acetaminophen (NORCO) 10-325 MG tablet Take 1 tablet by mouth daily.     iron polysaccharides (NU-IRON) 150 MG capsule Take 1 capsule (150 mg total) by mouth daily. 90 capsule 1   losartan (COZAAR) 50 MG tablet Take 1 tablet (50 mg total) by mouth daily. 90 tablet 3   Melatonin 5 MG TABS Take 15 mg by mouth at bedtime.     methocarbamol (ROBAXIN) 500 MG tablet Take 1 tablet (500 mg total) by mouth every 8 (eight) hours as needed for muscle spasms. 90 tablet 1   Omega-3 Fatty Acids (FISH OIL) 1000 MG CAPS Take 1,000 mg by mouth at bedtime.      oxybutynin (DITROPAN-XL) 10 MG 24 hr tablet Take 10 mg by mouth daily.     pantoprazole (PROTONIX) 40 MG tablet Take 1 tablet (40 mg total) by mouth 2  (two) times daily. (Patient taking differently: Take 40 mg by mouth 2 (two) times daily before a meal.) 60 tablet 2   phentermine 37.5 MG capsule Take 1 capsule (37.5 mg total) by mouth every morning. 30 capsule 0   prasugrel (EFFIENT) 5 MG TABS tablet Take 1 tablet (5 mg total) by mouth daily. 90 tablet 3   pregabalin (LYRICA) 200 MG capsule Take 1 capsule (200 mg total) by mouth in the morning, at noon, and at bedtime. 60 capsule 0   sildenafil (VIAGRA) 100 MG tablet Take 100 mg by mouth daily as needed for erectile dysfunction.      tamsulosin (FLOMAX) 0.4 MG CAPS capsule Take 0.4 mg by mouth 2 (two) times daily.     Testosterone 1.62 % GEL Place 1 Pump onto the skin daily. 88 g 1   traZODone (DESYREL) 150 MG tablet Take 1 tablet (150 mg total) by mouth at bedtime. 90 tablet 0   ZENPEP 40000-126000 units CPEP Take 2 capsules (80,000 units) with meals and 1 capsule (40,000 units) with each snack (Patient taking differently: Take 1-2 capsules by mouth See admin instructions. Take 2 capsules (80,000 units) with meals and 1 capsule (40,000 units) with each snack) 720 capsule 1   zolpidem (AMBIEN) 10 MG tablet Take 1 tablet (10 mg total) by mouth at bedtime as needed for sleep. 90 tablet 1   No current facility-administered medications on file prior to visit.   Observations/Objective: Alert, NAD, appropriate mood  and affect, resps normal, cn 2-12 intact, moves all 4s, no visible rash or swelling Lab Results  Component Value Date   WBC 3.4 (L) 12/01/2020   HGB 12.0 (L) 12/01/2020   HCT 35.4 (L) 12/01/2020   PLT 173.0 12/01/2020   GLUCOSE 84 12/01/2020   CHOL 113 12/01/2020   TRIG 85.0 12/01/2020   HDL 42.30 12/01/2020   LDLCALC 54 12/01/2020   ALT 11 12/01/2020   AST 22 12/01/2020   NA 139 12/01/2020   K 4.3 12/01/2020   CL 101 12/01/2020   CREATININE 1.23 12/01/2020   BUN 21 12/01/2020   CO2 32 12/01/2020   TSH 2.52 02/24/2020   PSA 0.56 07/24/2019   INR 1.1 06/03/2019   HGBA1C 6.1  12/01/2020   MICROALBUR <0.7 07/24/2019   Assessment and Plan: See notes  Follow Up Instructions: See notes  I discussed the assessment and treatment plan with the patient. The patient was provided an opportunity to ask questions and all were answered. The patient agreed with the plan and demonstrated an understanding of the instructions.   The patient was advised to call back or seek an in-person evaluation if the symptoms worsen or if the condition fails to improve as anticipated.  [name], MD

## 2020-12-22 NOTE — Assessment & Plan Note (Signed)
BP Readings from Last 3 Encounters:  12/15/20 109/73  12/01/20 134/78  10/27/20 (!) 150/77   Stable at last visit, pt to continue medical treatment  - hct, losartan, and continue to monitor BP at home and next visit

## 2020-12-22 NOTE — Assessment & Plan Note (Signed)
Lab Results  Component Value Date   HGBA1C 6.1 12/01/2020   Stable, pt to continue current medical treatment  - diet and wt control

## 2020-12-24 ENCOUNTER — Telehealth: Payer: Self-pay | Admitting: Specialist

## 2020-12-24 ENCOUNTER — Other Ambulatory Visit: Payer: Self-pay | Admitting: Internal Medicine

## 2020-12-24 NOTE — Telephone Encounter (Signed)
Pt called stating he would like a referral to be sent to [facility] so he can have a second opinion on his back before he proceeds with anymore procedures.   [facility] F# [phone]  Pt would like a CB when tis has been faxed  [phone]

## 2020-12-24 NOTE — Telephone Encounter (Signed)
Ok for referral?

## 2020-12-26 DIAGNOSIS — M48061 Spinal stenosis, lumbar region without neurogenic claudication: Secondary | ICD-10-CM | POA: Diagnosis not present

## 2020-12-26 DIAGNOSIS — M4726 Other spondylosis with radiculopathy, lumbar region: Secondary | ICD-10-CM | POA: Diagnosis not present

## 2020-12-26 DIAGNOSIS — M4326 Fusion of spine, lumbar region: Secondary | ICD-10-CM | POA: Diagnosis not present

## 2020-12-27 ENCOUNTER — Ambulatory Visit: Payer: Self-pay

## 2020-12-27 ENCOUNTER — Ambulatory Visit (INDEPENDENT_AMBULATORY_CARE_PROVIDER_SITE_OTHER): Payer: Medicare Other | Admitting: Orthopedic Surgery

## 2020-12-27 ENCOUNTER — Other Ambulatory Visit: Payer: Self-pay

## 2020-12-27 DIAGNOSIS — M25512 Pain in left shoulder: Secondary | ICD-10-CM | POA: Diagnosis not present

## 2020-12-27 DIAGNOSIS — M19012 Primary osteoarthritis, left shoulder: Secondary | ICD-10-CM

## 2020-12-28 ENCOUNTER — Telehealth: Payer: Self-pay

## 2020-12-28 ENCOUNTER — Telehealth: Payer: Self-pay | Admitting: Internal Medicine

## 2020-12-28 ENCOUNTER — Other Ambulatory Visit: Payer: Self-pay

## 2020-12-28 MED ORDER — ALBUTEROL SULFATE HFA 108 (90 BASE) MCG/ACT IN AERS: 2.0000 | INHALATION_SPRAY | Freq: Four times a day (QID) | RESPIRATORY_TRACT | 1 refills | Status: DC | PRN

## 2020-12-28 NOTE — Chronic Care Management (AMB) (Signed)
Chronic Care Management Pharmacy Assistant   Name: ROHIN KREJCI Sr.  MRN: [id number] DOB: January 30, 1951  Travis Novak Sr. is an 70 y.o. year old male who presents for his initial CCM visit with the clinical pharmacist.   Recent office visits:  12/01/20-James Quin Hoop, MD (PCP) Annual exam. Labs ordered. Follow up in 6 months.  11/02/20-James Quin Hoop, MD (PCP, Video Visit) Seen for Sinusitis. 10/11/20-James Quin Hoop, MD (PCP) Seen for memory changes. Start on losartan 50 mg for BP and ARICEPT 5 MG tablet. Referral for Neurology. Return if symptoms worsen or fail to improve. 09/17/20-James Quin Hoop, MD (PCP) General follow up. Pneumovax pneumonia shot given. Start Prednisone 10 mg. Referral to Opthalmology. Follow up in 3 months. 07/05/20-James Quin Hoop, MD (PCP) Seen for hard to manage saliva. Stop the afrin. Return if symptoms worsen or fail to improve. Recent consult visits:  12/27/20-Gregory Alphonzo Severance, MD (Orthopedics) Left shoulder pain. 12/20/20-Jane Pierce Crane (Pain Medicine) Seen for left shoulder pain. 12/15/20-James Howell Pringle, MD (Orthopedics) Seen for spine pain. X-ray of lumbar spine completed.  12/02/20-Jane Pierce Crane (Pain Medicine) Seen for back pain and shoulder pain. Follow up in 12 weeks. 11/19/20-Erin Domenic Polite, NP (Orthopedics) Seen for Leg length discrepancy.  11/16/20-Dwight Unice Bailey (Otolaryngology)  11/10/20-Syed T. Adele Schilder, MD [facility]) Notes not available.  10/27/20-Dwight Unice Bailey (Otolaryngology) Seen for Nasal polyps. 10/27/20-James Howell Pringle, MD (Orthopedics) Seen for lower back. X-ray lumbar spine. Follow up in 4 weeks. 09/16/20-James M. Ricard Dillon, PA-C (Orthopedics) Seen for lower back pain. Follow up in 6 weeks. 09/06/20-Jane Pierce Crane (Pain Medicine) Seen for Back pain and leg pain. Follow up in 3 months. 08/30/20-Benjamin Ardis Hughs (Urology) Notes not available.  08/23/20-Saad Amin (Internal Medicine) Notes not  available. 08/17/20-Asim Colleen Can (Physical Medicine and Rehabilitation) Notes not available. 08/12/20-Saad Amin (Internal Medicine) Notes not available.  08/11/20-James E. Nitka,MD (Orthopedics) Seen for back pain. Follow up in 3 weeks. 08/09/20-Saad Amin (Internal Medicine) Notes not available. 08/03/20-Neijia Grissom (Internal Medicine) Notes not available.  08/03/20-Asim Colleen Can  (Physical Medicine and Rehab) Notes not available. 07/26/20-Christopher Santina Evans (Cardiology) Follow up visit. EKG completed. Follow up in 1 year. 07/15/20-James Howell Pringle, MD (Orthopedics) Seen for Spinal pain. Surgery recommended is a five level lumbar fusion L1-2, L2-3, L3-4, L4-5 and L5-S1this would be done with rods, screws and cages with local bone graft and allograft (donor bone graft). will need to use a brace for 3 months and wean from the brace on the 4th month. Follow up in 4 weeks. 07/08/20-Jane Pierce Crane (Pain Medicine) Seen for back pain. Follow up in 2 months. Hospital visits:  Medication Reconciliation was completed by comparing discharge summary, patient's EMR and Pharmacy list, and upon discussion with patient.  Admitted to the hospital on 07/27/20 due to Congenital spinal stenosis of lumbar region. Discharge date was 08/02/20. Discharged from [facility]?Medications Started at [facility] Discharge:?? -started None noted  Medication Changes at Hospital Discharge: -Changed None noted  Medications Discontinued at Hospital Discharge: -Stopped None noted  Medications that remain the same after Hospital Discharge:??  -All other medications will remain the same.    Medications: Outpatient Encounter Medications as of 12/28/2020  Medication Sig   albuterol (VENTOLIN HFA) 108 (90 Base) MCG/ACT inhaler Inhale 2 puffs into the lungs every 6 (six) hours as needed for wheezing or shortness of breath.   ARIPiprazole (ABILIFY) 10 MG tablet Take 1 tablet (10 mg total) by  mouth daily.   aspirin 81 MG chewable tablet Chew 1 tablet (81 mg total) by mouth daily.   atorvastatin (LIPITOR) 20 MG tablet Take 1 tablet (20 mg total) by mouth daily. (Patient taking differently: Take 20 mg by mouth daily.)   Azelastine-Fluticasone 137-50 MCG/ACT SUSP Use both nostrils as directed twice daily   benztropine (COGENTIN) 0.5 MG tablet Take 1 tablet (0.5 mg total) by mouth at bedtime. (Patient not taking: Reported on 12/01/2020)   calcium carbonate (TUMS - DOSED IN MG ELEMENTAL CALCIUM) 500 MG chewable tablet Chew 1 tablet by mouth 3 (three) times daily with meals.   celecoxib (CELEBREX) 200 MG capsule Take 1 capsule (200 mg total) by mouth 2 (two) times daily.   diclofenac Sodium (VOLTAREN) 1 % GEL Apply 4 g topically 3 (three) times daily as needed (pain).   donepezil (ARICEPT) 5 MG tablet Take 1 tablet (5 mg total) by mouth at bedtime.   DULoxetine (CYMBALTA) 20 MG capsule Take 20 mg by mouth daily.   DULoxetine 40 MG CPEP Take 40 mg by mouth 2 (two) times daily.   hydrochlorothiazide (HYDRODIURIL) 25 MG tablet Take 1 tablet (25 mg total) by mouth daily. (Patient taking differently: Take 25 mg by mouth daily.)   HYDROcodone bit-homatropine (HYCODAN) 5-1.5 MG/5ML syrup Take 5 mLs by mouth every 6 (six) hours as needed for up to 10 days.   HYDROcodone-acetaminophen (NORCO) 10-325 MG tablet Take 1 tablet by mouth daily.   iron polysaccharides (NU-IRON) 150 MG capsule Take 1 capsule (150 mg total) by mouth daily.   losartan (COZAAR) 50 MG tablet Take 1 tablet (50 mg total) by mouth daily.   Melatonin 5 MG TABS Take 15 mg by mouth at bedtime.   methocarbamol (ROBAXIN) 500 MG tablet Take 1 tablet (500 mg total) by mouth every 8 (eight) hours as needed for muscle spasms.   Omega-3 Fatty Acids (FISH OIL) 1000 MG CAPS Take 1,000 mg by mouth at bedtime.    oxybutynin (DITROPAN-XL) 10 MG 24 hr tablet Take 10 mg by mouth daily.   pantoprazole (PROTONIX) 40 MG tablet Take 1 tablet (40 mg  total) by mouth 2 (two) times daily. (Patient taking differently: Take 40 mg by mouth 2 (two) times daily before a meal.)   phentermine 37.5 MG capsule Take 1 capsule (37.5 mg total) by mouth every morning.   prasugrel (EFFIENT) 5 MG TABS tablet Take 1 tablet (5 mg total) by mouth daily.   predniSONE (DELTASONE) 10 MG tablet 3 tabs by mouth per day for 3 days,2tabs per day for 3 days,1tab per day for 3 days   pregabalin (LYRICA) 200 MG capsule Take 1 capsule (200 mg total) by mouth in the morning, at noon, and at bedtime.   sildenafil (VIAGRA) 100 MG tablet Take 100 mg by mouth daily as needed for erectile dysfunction.    tamsulosin (FLOMAX) 0.4 MG CAPS capsule Take 0.4 mg by mouth 2 (two) times daily.   Testosterone 1.62 % GEL Place 1 Pump onto the skin daily.   traZODone (DESYREL) 150 MG tablet Take 1 tablet (150 mg total) by mouth at bedtime.   ZENPEP 40000-126000 units CPEP Take 2 capsules (80,000 units) with meals and 1 capsule (40,000 units) with each snack (Patient taking differently: Take 1-2 capsules by mouth See admin instructions. Take 2 capsules (80,000 units) with meals and 1 capsule (40,000 units) with each snack)   zolpidem (AMBIEN) 10 MG tablet Take 1 tablet (10 mg total) by mouth at bedtime  as needed for sleep.   No facility-administered encounter medications on file as of 12/28/2020.   Albuterol (VENTOLIN HFA) 108 (90 Base) MCG/ACT inhaler Last filled:11/30/20 None noted DS ARIPiprazole (ABILIFY) 10 MG tablet Last filled:11/30/20 None noted DS Aspirin 81 MG chewable tablet Last filled:None noted Atorvastatin (LIPITOR) 20 MG tablet Last filled:11/30/20 None noted DS Azelastine-Fluticasone 137-50 MCG/ACT SUSP Last filled:None noted Benztropine (COGENTIN) 0.5 MG tablet Last filled:11/30/20 None noted DS Calcium carbonate (TUMS - DOSED IN MG ELEMENTAL CALCIUM) 500 MG chewable tablet Last filled:None noted Celecoxib (CELEBREX) 200 MG capsule Last filled:11/30/20 None noted  DS Diclofenac Sodium (VOLTAREN) 1 % GEL Last filled:12/07/20 25 DS Donepezil (ARICEPT) 5 MG tablet Last filled:11/30/20 None noted DULoxetine (CYMBALTA) 20 MG capsule Last filled:11/30/20 None noted DS Hydrochlorothiazide (HYDRODIURIL) 25 MG tablet Last filled:11/30/20 None noted DS HYDROcodone bit-homatropine (HYCODAN) 5-1.5 MG/5ML syrup Last filled:12/22/20 10 DS HYDROcodone-acetaminophen (NORCO) 10-325 MG tablet Last filled:12/09/20 30 DS Iron polysaccharides (NU-IRON) 150 MG capsule Last filled:None noted Losartan (COZAAR) 50 MG tablet Last filled:11/30/20 None noted Melatonin 5 MG TABS Last filled:None noted Methocarbamol (ROBAXIN) 500 MG tablet Last filled:12/24/20 30 DS Omega-3 Fatty Acids (FISH OIL) 1000 MG CAPS Last filled:None noted Oxybutynin (DITROPAN-XL) 10 MG 24 hr tablet Last filled:11/30/20 None noted Pantoprazole (PROTONIX) 40 MG tablet Last filled:11/30/20 None noted Phentermine 37.5 MG capsule Last filled:12/01/20 30 DS Prasugrel (EFFIENT) 5 MG TABS tablet Last filled:11/30/20 None noted PredniSONE (DELTASONE) 10 MG tablet Last filled:12/22/20 9 DS Pregabalin (LYRICA) 200 MG capsule Last filled:12/24/20 30 DS Sildenafil (VIAGRA) 100 MG tablet Last filled:10/24/18 10 DS Tamsulosin (FLOMAX) 0.4 MG CAPS capsule Last filled:12/28/20 30 DS Testosterone 1.62 % GEL Last filled:11/30/20 None noted TraZODone (DESYREL) 150 MG tablet Last filled:11/30/20 None noted ZENPEP 40000-126000 units CPEP Last filled:None noted Zolpidem (AMBIEN) 10 MG tablet Last filled:12/06/20 90 DS   Star Rating Drugs: Losartan (COZAAR) 50 MG tablet Last filled:11/30/20 None noted Atorvastatin (LIPITOR) 20 MG tablet Last filled:11/30/20 None noted DS  Corrie Mckusick, [place]

## 2020-12-28 NOTE — Telephone Encounter (Signed)
1.Medication Requested: albuterol (VENTOLIN HFA) 108 (90 Base) MCG/ACT inhaler  2. Pharmacy (Name, Street, [place]): [facility], [place]  Phone:  [phone] Fax:  [phone]   3. On Med List: yes  4. Last Visit with PCP: 08.03.22  5. Next visit date with PCP: 01.13.23   Agent: Please be advised that RX refills may take up to 3 business days. We ask that you follow-up with your pharmacy.

## 2020-12-31 ENCOUNTER — Encounter (HOSPITAL_COMMUNITY): Payer: Self-pay | Admitting: Psychiatry

## 2020-12-31 ENCOUNTER — Telehealth (INDEPENDENT_AMBULATORY_CARE_PROVIDER_SITE_OTHER): Payer: Medicare Other | Admitting: Psychiatry

## 2020-12-31 ENCOUNTER — Ambulatory Visit (INDEPENDENT_AMBULATORY_CARE_PROVIDER_SITE_OTHER)
Admission: RE | Admit: 2020-12-31 | Discharge: 2020-12-31 | Disposition: A | Discharge status: Home / Self Care | Payer: Medicare Other | Source: Ambulatory Visit | Attending: Internal Medicine | Admitting: Internal Medicine

## 2020-12-31 ENCOUNTER — Other Ambulatory Visit: Payer: Self-pay

## 2020-12-31 DIAGNOSIS — R062 Wheezing: Secondary | ICD-10-CM | POA: Diagnosis not present

## 2020-12-31 DIAGNOSIS — R059 Cough, unspecified: Secondary | ICD-10-CM | POA: Diagnosis not present

## 2020-12-31 DIAGNOSIS — F319 Bipolar disorder, unspecified: Secondary | ICD-10-CM | POA: Diagnosis not present

## 2020-12-31 DIAGNOSIS — F419 Anxiety disorder, unspecified: Secondary | ICD-10-CM | POA: Diagnosis not present

## 2020-12-31 MED ORDER — ARIPIPRAZOLE 10 MG PO TABS: 10.0000 mg | ORAL_TABLET | Freq: Every day | ORAL | 0 refills | Status: DC

## 2020-12-31 MED ORDER — TRAZODONE HCL 150 MG PO TABS: 150.0000 mg | ORAL_TABLET | Freq: Every day | ORAL | 0 refills | Status: DC

## 2020-12-31 MED ORDER — DULOXETINE HCL 40 MG PO CPEP: 40.0000 mg | ORAL_CAPSULE | Freq: Two times a day (BID) | ORAL | 0 refills | Status: DC

## 2020-12-31 NOTE — Progress Notes (Signed)
Virtual Visit via Telephone Note  I connected with Travis [name]. on 12/31/20 at  9:00 AM EDT by telephone and verified that I am speaking with the correct person using two identifiers.  Location: Patient: Home Provider: Home Office   I discussed the limitations, risks, security and privacy concerns of performing an evaluation and management service by telephone and the availability of in person appointments. I also discussed with the patient that there may be a patient responsible charge related to this service. The patient expressed understanding and agreed to proceed.   History of Present Illness: Patient is evaluated by phone session.  On the last visit we increased Cymbalta 40 mg twice a day and he noticed improvement in his anxiety and depression.  He is still have a lot of back pain and now he may require shoulder surgery coming up in September.  He had back surgery in March but due to residual pain he may need more treatment.  He is sleeping 5 to 6 hours with the help of trazodone, Ambien and melatonin.  His Ambien is prescribed by his PCP.  He is excited about upcoming trip in November to [place] as he is going to become a grandfather.  He is also plan to see [employer] game in [place] in September.  Patient told he has help around and his niece and her husband helps him a lot.  He denies any mania, anger, impulsive behavior, agitation, suicidal thoughts or homicidal thoughts.  He is trying to lose weight and he had lost weight as he is more cautious about his diet and watching his calorie intake.  Sometimes he has tremors and he used to take benztropine but it was discontinued as patient is taking multiple medication and having dry mouth and constipation.  Patient does not want to change the medication since it is working.  He denies any panic attack, crying spells or any suicidal thoughts.     Past Psychiatric History:  Saw psychiatrist in [place] and [place].  H/O  mood swings, suicidal gesture, anger, hallucination, irritability and increase spending. Married 5 times. H/O inpatient in 2014 due to overdose on Xanax and alcohol.  Did IOP. Took Effexor, Xanax, Klonopin, Ambien, Lunesta, Wellbutrin, Ambien, Remeron and Prozac. Abilify started in our office.   Recent Results (from the past 2160 hour(s))  HM DIABETES EYE EXAM     Status: None   Collection Time: 11/30/20 12:00 AM  Result Value Ref Range   HM Diabetic Eye Exam No Retinopathy No Retinopathy    Comment: [facility]  Ferritin     Status: None   Collection Time: 12/01/20  1:48 PM  Result Value Ref Range   Ferritin 25.6 22.0 - 322.0 ng/mL  IBC panel     Status: Abnormal   Collection Time: 12/01/20  1:48 PM  Result Value Ref Range   Iron 49 42 - 165 ug/dL   Transferrin 231.0 212.0 - 360.0 mg/dL   Saturation Ratios 15.2 (L) 20.0 - 50.0 %  VITAMIN D 25 Hydroxy (Vit-D Deficiency, Fractures)     Status: None   Collection Time: 12/01/20  1:48 PM  Result Value Ref Range   VITD 53.07 30.00 - 100.00 ng/mL  Vitamin B12     Status: None   Collection Time: 12/01/20  1:48 PM  Result Value Ref Range   Vitamin B-12 504 211 - 911 pg/mL  Basic metabolic panel     Status: None   Collection Time: 12/01/20  1:48 PM  Result Value Ref Range   Sodium 139 135 - 145 mEq/L   Potassium 4.3 3.5 - 5.1 mEq/L   Chloride 101 96 - 112 mEq/L   CO2 32 19 - 32 mEq/L   Glucose, Bld 84 70 - 99 mg/dL   BUN 21 6 - 23 mg/dL   Creatinine, Ser 1.23 0.40 - 1.50 mg/dL   GFR 60.32 >60.00 mL/min    Comment: Calculated using the CKD-EPI Creatinine Equation (2021)   Calcium 9.1 8.4 - 10.5 mg/dL  CBC with Differential/Platelet     Status: Abnormal   Collection Time: 12/01/20  1:48 PM  Result Value Ref Range   WBC 3.4 (L) 4.0 - 10.5 K/uL   RBC 4.22 4.22 - 5.81 Mil/uL   Hemoglobin 12.0 (L) 13.0 - 17.0 g/dL   HCT 35.4 (L) 39.0 - 52.0 %   MCV 83.8 78.0 - 100.0 fl   MCHC 33.8 30.0 - 36.0 g/dL   RDW 15.5 11.5 - 15.5 %   Platelets  173.0 150.0 - 400.0 K/uL   Neutrophils Relative % 43.8 43.0 - 77.0 %   Lymphocytes Relative 36.1 12.0 - 46.0 %   Monocytes Relative 10.8 3.0 - 12.0 %   Eosinophils Relative 8.4 (H) 0.0 - 5.0 %   Basophils Relative 0.9 0.0 - 3.0 %   Neutro Abs 1.5 1.4 - 7.7 K/uL   Lymphs Abs 1.2 0.7 - 4.0 K/uL   Monocytes Absolute 0.4 0.1 - 1.0 K/uL   Eosinophils Absolute 0.3 0.0 - 0.7 K/uL   Basophils Absolute 0.0 0.0 - 0.1 K/uL  Hepatic function panel     Status: None   Collection Time: 12/01/20  1:48 PM  Result Value Ref Range   Total Bilirubin 0.4 0.2 - 1.2 mg/dL   Bilirubin, Direct 0.1 0.0 - 0.3 mg/dL   Alkaline Phosphatase 58 39 - 117 U/L   AST 22 0 - 37 U/L   ALT 11 0 - 53 U/L   Total Protein 6.6 6.0 - 8.3 g/dL   Albumin 4.1 3.5 - 5.2 g/dL  Lipid panel     Status: None   Collection Time: 12/01/20  1:48 PM  Result Value Ref Range   Cholesterol 113 0 - 200 mg/dL    Comment: ATP III Classification       Desirable:  < 200 mg/dL               Borderline High:  200 - 239 mg/dL          High:  > = 240 mg/dL   Triglycerides 85.0 0.0 - 149.0 mg/dL    Comment: Normal:  <150 mg/dLBorderline High:  150 - 199 mg/dL   HDL 42.30 >39.00 mg/dL   VLDL 17.0 0.0 - 40.0 mg/dL   LDL Cholesterol 54 0 - 99 mg/dL   Total CHOL/HDL Ratio 3     Comment:                Men          Women1/2 Average Risk     3.4          3.3Average Risk          5.0          4.42X Average Risk          9.6          7.13X Average Risk          15.0  11.0                       NonHDL 70.53     Comment: NOTE:  Non-HDL goal should be 30 mg/dL higher than patient's LDL goal (i.e. LDL goal of < 70 mg/dL, would have non-HDL goal of < 100 mg/dL)  Hemoglobin A1c     Status: None   Collection Time: 12/01/20  1:48 PM  Result Value Ref Range   Hgb A1c MFr Bld 6.1 4.6 - 6.5 %    Comment: Glycemic Control Guidelines for People with Diabetes:Non Diabetic:  <6%Goal of Therapy: <7%Additional Action Suggested:  >8%      Psychiatric Specialty  Exam: Physical Exam  Review of Systems  Weight (!) 312 lb (141.5 kg).There is no height or weight on file to calculate BMI.  General Appearance: NA  Eye Contact:  NA  Speech:  Slow  Volume:  Decreased  Mood:  Dysphoric  Affect:  NA  Thought Process:  Goal Directed  Orientation:  Full (Time, Place, and Person)  Thought Content:  Rumination  Suicidal Thoughts:  No  Homicidal Thoughts:  No  Memory:  Immediate;   Good Recent;   Good Remote;   Good  Judgement:  Intact  Insight:  Present  Psychomotor Activity:  NA  Concentration:  Concentration: Fair and Attention Span: Fair  Recall:  [place] of Knowledge:  Good  Language:  Good  Akathisia:  No  Handed:  Right  AIMS (if indicated):     Assets:  Communication Skills Desire for Improvement Housing Social Support  ADL's:  Intact  Cognition:  WNL  Sleep:   5 hrs      Assessment and Plan: Bipolar disorder type I.  Anxiety.  I reviewed blood work results.  His hemoglobin A1c is 6.1 on July 13.  He had lost weight from the past.  He still have mild tremors but does not interfere in his daily life.  We talked about reducing the Abilify and the patient does not want to change because it is helping his mood and irritability.  We will continue Abilify 10 mg daily, trazodone 150 mg at bedtime and he like to keep the Cymbalta 40 mg twice a day as it helps his anxiety and depression.  Patient is getting Ambien from his PCP and pain medicine from his pain management.  He is not taking gabapentin and Lyrica has been discontinued.  Recommended to call us back if there is any question or any concern.  Follow-up in 3 months.  Follow Up Instructions:    I discussed the assessment and treatment plan with the patient. The patient was provided an opportunity to ask questions and all were answered. The patient agreed with the plan and demonstrated an understanding of the instructions.   The patient was advised to call back or seek an in-person  evaluation if the symptoms worsen or if the condition fails to improve as anticipated.  I provided 20 minutes of non-face-to-face time during this encounter.   Kathlee Nations, MD

## 2021-01-01 ENCOUNTER — Encounter: Payer: Self-pay | Admitting: Internal Medicine

## 2021-01-04 ENCOUNTER — Encounter (INDEPENDENT_AMBULATORY_CARE_PROVIDER_SITE_OTHER): Payer: Self-pay

## 2021-01-04 ENCOUNTER — Ambulatory Visit (INDEPENDENT_AMBULATORY_CARE_PROVIDER_SITE_OTHER): Payer: Medicare Other | Admitting: Family Medicine

## 2021-01-04 DIAGNOSIS — R5383 Other fatigue: Secondary | ICD-10-CM

## 2021-01-07 ENCOUNTER — Ambulatory Visit
Admission: RE | Admit: 2021-01-07 | Discharge: 2021-01-07 | Disposition: A | Discharge status: Home / Self Care | Payer: Medicare Other | Source: Ambulatory Visit | Attending: Orthopedic Surgery | Admitting: Orthopedic Surgery

## 2021-01-07 ENCOUNTER — Telehealth: Payer: Self-pay | Admitting: Specialist

## 2021-01-07 DIAGNOSIS — M19012 Primary osteoarthritis, left shoulder: Secondary | ICD-10-CM | POA: Diagnosis not present

## 2021-01-07 DIAGNOSIS — R3915 Urgency of urination: Secondary | ICD-10-CM | POA: Diagnosis not present

## 2021-01-07 DIAGNOSIS — M25512 Pain in left shoulder: Secondary | ICD-10-CM

## 2021-01-07 DIAGNOSIS — Z981 Arthrodesis status: Secondary | ICD-10-CM | POA: Diagnosis not present

## 2021-01-07 NOTE — Telephone Encounter (Signed)
Patient called asked when will he be referred to [facility]. Patient said he would like a call back when referral is sent to [facility].  The number to contact patient is [phone]

## 2021-01-10 ENCOUNTER — Telehealth: Payer: Self-pay | Admitting: Orthopedic Surgery

## 2021-01-10 NOTE — Telephone Encounter (Signed)
Patient called needing Rx faxed to [facility] for New liners and socks. The number to contact patient is [phone]

## 2021-01-11 ENCOUNTER — Encounter: Payer: Self-pay | Admitting: Orthopedic Surgery

## 2021-01-11 NOTE — Progress Notes (Signed)
Office Visit Note   Patient: Travis DIIORIO Sr.           Date of Birth: 26-Jul-1950           MRN: [id number] Visit Date: 12/27/2020 Requested by: [name], MD [place],  [place] [address] PCP: [name], MD  Subjective: Chief Complaint  Patient presents with   Left Shoulder - Pain    HPI: Travis Malmstrom. is a 70 y.o. male who presents to the office complaining of left shoulder pain.  Patient complains of severe pain in the left shoulder has been ongoing for at least 6 months.  He denies any history of injury recently.  Localizes pain to the lateral aspect of the shoulder as well as the mid humerus with radiation to the elbow on occasion.  No radicular pain past the elbow, neck pain, scapular pain.  He does have occasional numbness and tingling in the arm but this is only about once per week.  He has pain with pretty much moving his arm in any direction.  Feels weak with the left arm as far as lifting with his shoulder but the pain is more bothersome than the weakness.  He has history of prior arthroscopic rotator cuff repair about 15 to 20 years ago.  He has also been seen for his right shoulder in the past and was told he needs shoulder replacement.  The left shoulder is bothering him much more than the right shoulder.  He wakes with pain every night.  In his free time he enjoys billiards, darts, going to the beach.  He is currently retired and on disability.  He does take Effient.  Has history of 9 stents and his cardiologist is Dr. Angelena Form.  Takes Norco 10 mg 4 times per day from pain management for back pain.  No history of diabetes..                ROS: All systems reviewed are negative as they relate to the chief complaint within the history of present illness.  Patient denies fevers or chills.  Assessment & Plan: Visit Diagnoses:  1. Left shoulder pain, unspecified chronicity     Plan: Patient is a 70 year old male who presents with complaint of  severe left shoulder pain.  He has had shoulder pain worsening over the last half year to the point where he wakes up with pain every night.  He has reduced passive range of motion of the shoulder and his shoulder is bothering him even more than his right shoulder which she has been seeing for several years ago.  Radiographs of the left shoulder taken today demonstrate severe glenohumeral degenerative changes.  He has some rotator cuff weakness on exam today but subscapularis seems to be intact.  Plan to reevaluate rotator cuff on exam next visit and obtain thin cut CT scan of the left shoulder for preoperative planning purposes.  Anticipate reverse shoulder arthroplasty and will discussed the risks and benefits of the procedure at his next appointment when he returns to review left shoulder CT.  Follow-Up Instructions: No follow-ups on file.   Orders:  Orders Placed This Encounter  Procedures   XR Shoulder Left   CT SHOULDER LEFT WO CONTRAST   No orders of the defined types were placed in this encounter.     Procedures: No procedures performed   Clinical Data: No additional findings.  Objective: Vital Signs: There were no vitals taken for  this visit.  Physical Exam:  Constitutional: Patient appears well-developed HEENT:  Head: Normocephalic Eyes:EOM are normal Neck: Normal range of motion Cardiovascular: Normal rate Pulmonary/chest: Effort normal Neurologic: Patient is alert Skin: Skin is warm Psychiatric: Patient has normal mood and affect  Ortho Exam: Ortho exam demonstrates left shoulder with 30 degrees external rotation, 95 degrees abduction, 95 degrees forward flexion.  Weakness with external rotation.  Axillary nerve intact with deltoid firing.  2+ radial pulse of the left upper extremity.  5/5 motor strength of bilateral grip strength, finger abduction, pronation/supination, bicep, tricep, deltoid.  Weakness with supraspinatus strength testing but subscapularis seems to be  intact by strength assessment.  Specialty Comments:  No specialty comments available.  Imaging: No results found.   PMFS History: Patient Active Problem List   Diagnosis Date Noted   Sinusitis 11/02/2020   Memory loss or impairment 10/11/2020   Right arm pain 09/18/2020   Aortic atherosclerosis ([place]) 09/16/2020   Spondylolisthesis, lumbar region    Status post lumbar spinal fusion 07/27/2020   Nasal sinus polyp 06/16/2020   Vitamin D deficiency 02/24/2020   Cough 07/29/2019   Wheezing 07/29/2019   Possible exposure to STD 07/24/2019   Unilateral primary osteoarthritis, left hip 06/06/2019   Status post total replacement of left hip 06/06/2019   S/P laparoscopic sleeve gastrectomy 03/03/2019   Rash 01/24/2019   Fever 08/15/2018   UTI (urinary tract infection) 08/15/2018   Fall at home, initial encounter 08/15/2018   Chronic anemia 08/15/2018   Hypokalemia 08/15/2018   Bowel incontinence 07/25/2018   Other spondylosis with radiculopathy, lumbar region 07/16/2018    Class: Chronic   Status post lumbar laminectomy 07/16/2018   Status post THR (total hip replacement) 04/17/2018   Unilateral primary osteoarthritis, right hip    Morbid obesity with BMI of 40.0-44.9, adult ([place]) 02/28/2018   Myocardial infarction ([place]) 02/25/2018   Coronary artery disease involving native coronary artery of native heart 02/25/2018   PAD (peripheral artery disease) ([place]) 02/25/2018   S/P BKA (below knee amputation) unilateral, right ([place]) 02/25/2018   Sacroiliitis ([place]) 02/25/2018   SOB (shortness of breath) 01/03/2018   Acute on chronic heart failure ([place]) 01/03/2018   Severe right groin pain 12/20/2017   Morbid obesity ([place]) 10/09/2017   Low back pain 07/13/2017   Leukoplakia, tongue 01/26/2017   Skin lesion 10/20/2016   S/P unilateral BKA (below knee amputation), right ([place]) 06/14/2016   Charcot foot due to diabetes mellitus ([place])    Charcot's arthropathy associated with type 2 diabetes  mellitus ([place]) 04/11/2016   Encounter for well adult exam with abnormal findings 11/05/2015   Major depression 09/13/2015   S/P TKR (total knee replacement) bilaterally 09/13/2015   GERD (gastroesophageal reflux disease) 09/08/2015   S/P laparoscopic hernia repair 05/11/2015   PPD positive 04/08/2015   Benign neoplasm of descending colon    Benign neoplasm of cecum    Acute blood loss as cause of postoperative anemia    Chronic anticoagulation    Occult blood positive stool 10/17/2014   General weakness 07/14/2014   Urinary incontinence 07/14/2014   Headache(784.0) 10/15/2013   Spinal stenosis in cervical region 09/26/2013    Class: Chronic   Congenital spinal stenosis of lumbar region 09/26/2013    Class: Chronic   Hand joint pain 06/10/2013   Rotator cuff tear arthropathy of both shoulders 06/10/2013   Skin lesion of cheek 05/01/2013   Pain of right thumb 04/03/2013   Balance disorder 03/12/2013   Gait disorder 03/12/2013  Tremor 03/12/2013   Left hip pain 03/12/2013   Pre-ulcerative corn or callous 02/06/2013   Anxiety 11/12/2011   OSA on CPAP 11/07/2011   Bradycardia 10/20/2011   Insomnia 10/04/2011   Obesity 01/12/2011   Diabetes mellitus type 2 in obese ([place]) 09/27/2010   ERECTILE DYSFUNCTION, ORGANIC 05/30/2010   Degenerative disc disease, lumbar 04/19/2010   SCIATICA, LEFT 04/19/2010   Chronic pain syndrome 10/27/2009   Hyperlipidemia 07/15/2009   Essential hypertension 06/24/2009   Coronary artery disease involving native coronary artery of native heart without angina pectoris 06/24/2009   Allergic rhinitis 06/24/2009   URETHRAL STRICTURE 06/24/2009   DEGENERATIVE JOINT DISEASE 06/24/2009   SHOULDER PAIN, BILATERAL 06/24/2009   FATIGUE 06/24/2009   NEPHROLITHIASIS, HX OF 06/24/2009   Past Medical History:  Diagnosis Date   ALLERGIC RHINITIS 06/24/2009   Allergy    Anemia    IDA   Anxiety 11/12/2011   Adequate for discharge    Arthritis    "all my  joints" (09/30/2013)   Arthritis of foot, right, degenerative 04/15/2014   Balance disorder 03/12/2013   Benign neoplasm of cecum    Benign neoplasm of descending colon    CAD (coronary artery disease) 06/24/2009   5 stents placed in 2007     Chronic anticoagulation    Chronic pain syndrome 10/27/2009   of ankle, shoulders, low back.  sciatica.    Closed fracture of right foot 10/17/2014   CORONARY ARTERY DISEASE 06/24/2009   a. s/p multiple PCIs - In 2008 he had a Taxus DES to the mild LAD, Endeavor DES to mid LCX and distal LCX. In January 2009 he had DES to distal LCX, mid LCX and proximal LCX. In November 2009 had BMS x 2 to the mid RCA. Cath 10/2011 with patent stents, noncardiac CP. LHC 01/2013: patent stents (noncardiac CP).   DEGENERATIVE JOINT DISEASE 06/24/2009   Qualifier: Diagnosis of  By: [name], [name]    Depression    Depression with anxiety    Prior suicide attempt(08/25/19-pt states not suicide attempt)   [place] DISEASE, LUMBAR 04/19/2010   ERECTILE DYSFUNCTION, ORGANIC 05/30/2010   Essential hypertension 06/24/2009   Qualifier: Diagnosis of  By: [name], [name]    Fibromyalgia    Fracture dislocation of ankle joint 09/02/2015   Gait disorder 03/12/2013   General weakness 07/14/2014   GERD (gastroesophageal reflux disease) 09/08/2015   08/25/15-pt states was cardiac origin, not GERD   Hand joint pain 06/10/2013   Heart murmur    Hepatitis C    treated pt. unknown with what he was a teenager   History of kidney stones    Hyperlipidemia 07/15/2009   Qualifier: Diagnosis of  By: Aundra Dubin, MD, [place]     HYPERLIPIDEMIA-MIXED 07/15/2009   08/25/19-pt state cholesterol was normal range   HYPERTENSION 06/24/2009   Insomnia 10/04/2011   Irregular heart beat    Left hip pain 03/12/2013   Injected under ultrasound guidance on June 24, 2013    Major depression 09/13/2015   Myocardial infarction [facility]) 2008   Non-cardiac chest pain 10/2011, 01/2013   Obesity    Occult blood positive stool 10/17/2014    Open ankle fracture 09/02/2015   OSA (obstructive sleep apnea)    not using CPAP (09/30/2013)   Pain of right thumb 04/03/2013   Pneumonia    PPD positive 04/08/2015   Pre-ulcerative corn or callous 02/06/2013   Rotator cuff tear arthropathy of both shoulders 06/10/2013   History of bilateral  shoulder cuff surgery for rotator cuff tears. Reports increase in pain 09/11/2015 during physical therapy of the left shoulder.    SCIATICA, LEFT 04/19/2010   Qualifier: Diagnosis of  By: [name], [name]    Sleep apnea    wears cpap 08/25/19-States not using due to nasal stuffiness.   Spinal stenosis in cervical region 09/26/2013   Spinal stenosis, lumbar region, with neurogenic claudication 09/26/2013   Type II diabetes mellitus ([place]) 2012   no meds in 09/2014.    Umbilical hernia    Uncontrolled type 2 DM with peripheral circulatory disorder ([place]) 10/04/2013   08/25/19- pt states A1C normal, no diabetes   URETHRAL STRICTURE 06/24/2009   self catheterizes.     Family History  Problem Relation Age of Onset   Depression Mother    Heart disease Mother    Hypertension Mother    Breast cancer Mother        primary cancer   Stomach cancer Mother    Esophageal cancer Mother    Pancreatic cancer Mother    Diabetes Father    Heart disease Father        CABG   Hypertension Father    Hyperlipidemia Father    Prostate cancer Father    Skin cancer Father    Depression Brother        x 2   Hypertension Brother        x2   Colon polyps Brother    Heart attack Son 76   Early death Son    Heart disease Maternal Grandfather    Early death Maternal Grandfather    Heart attack Maternal Grandfather 79   Early death Paternal Grandfather    Coronary artery disease Other    Hypertension Other    Depression Other    Healthy Son    Colon cancer Neg Hx    Rectal cancer Neg Hx     Past Surgical History:  Procedure Laterality Date   AMPUTATION Right 06/14/2016   Procedure: AMPUTATION BELOW KNEE;  Surgeon:  Newt Minion, MD;  Location: [place];  Service: Orthopedics;  Laterality: Right;   ANKLE FUSION Right 04/15/2014   Procedure: Right Subtalar, Talonavicular Fusion;  Surgeon: Newt Minion, MD;  Location: [place];  Service: Orthopedics;  Laterality: Right;   ANKLE FUSION Right 04/18/2016   Procedure: Right Ankle Tibiocalcaneal Fusion;  Surgeon: Newt Minion, MD;  Location: [place];  Service: Orthopedics;  Laterality: Right;   ANTERIOR CERVICAL DECOMP/DISCECTOMY FUSION N/A 09/26/2013   Procedure: ANTERIOR CERVICAL DISCECTOMY FUSION C3-4, plate and screw fixation, allograft bone graft;  Surgeon: Jessy Oto, MD;  Location: [place];  Service: Orthopedics;  Laterality: N/A;   BACK SURGERY     3   BELOW KNEE LEG AMPUTATION Right 06/14/2016   right ankle and foot   CARDIAC CATHETERIZATION  X 1   CARPAL TUNNEL RELEASE Bilateral    COLONOSCOPY N/A 10/22/2014   Procedure: COLONOSCOPY;  Surgeon: Lafayette Dragon, MD;  Location: [facility] ENDOSCOPY;  Service: Endoscopy;  Laterality: N/A;   COLONOSCOPY  11/19/2018   CORONARY ANGIOPLASTY WITH STENT PLACEMENT     "I have 9 stents"   ESOPHAGOGASTRODUODENOSCOPY N/A 10/19/2014   Procedure: ESOPHAGOGASTRODUODENOSCOPY (EGD);  Surgeon: Jerene Bears, MD;  Location[facility] ENDOSCOPY;  Service: Endoscopy;  Laterality: N/A;   FRACTURE SURGERY     FUSION OF TALONAVICULAR JOINT Right 04/15/2014   dr duda   GASTRIC BYPASS  02/20/2019   HERNIA REPAIR  umbilical   INGUINAL HERNIA REPAIR Right 05/11/2015   Procedure: LAPAROSCOPIC REPAIR RIGHT  INGUINAL HERNIA;  Surgeon: Greer Pickerel, MD;  Location: [place];  Service: General;  Laterality: Right;   INSERTION OF MESH Right 05/11/2015   Procedure: INSERTION OF MESH;  Surgeon: Greer Pickerel, MD;  Location: [place];  Service: General;  Laterality: Right;   JOINT REPLACEMENT     KNEE CARTILAGE SURGERY Right X 12   "~ 1/2 open; ~ 1/2 scopes"   KNEE CARTILAGE SURGERY Left X 3   "3 scopes"   LAPAROSCOPIC GASTRIC SLEEVE RESECTION N/A 03/03/2019    Procedure: LAPAROSCOPIC GASTRIC SLEEVE RESECTION, Upper Endo, ERAS Pathway;  Surgeon: Greer Pickerel, MD;  Location: [facility];  Service: General;  Laterality: N/A;   LEFT HEART CATHETERIZATION WITH CORONARY ANGIOGRAM N/A 02/10/2013   Procedure: LEFT HEART CATHETERIZATION WITH CORONARY ANGIOGRAM;  Surgeon: Burnell Blanks, MD;  Location: [facility] CATH LAB;  Service: Cardiovascular;  Laterality: N/A;   LUMBAR LAMINECTOMY N/A 07/16/2018   Procedure: LEFT L4-5 REDO PARTIAL LUMBAR HEMILAMINECTOMY WITH FORAMINOTOMY LEFT L4;  Surgeon: Jessy Oto, MD;  Location: [place];  Service: Orthopedics;  Laterality: N/A;   LUMBAR LAMINECTOMY/DECOMPRESSION MICRODISCECTOMY N/A 01/27/2014   Procedure: CENTRAL LUMBAR LAMINECTOMY L4-5 AND L3-4;  Surgeon: Jessy Oto, MD;  Location: [place];  Service: Orthopedics;  Laterality: N/A;   ORIF ANKLE FRACTURE Right 09/02/2015   Procedure: OPEN REDUCTION INTERNAL FIXATION (ORIF) ANKLE FRACTURE;  Surgeon: Newt Minion, MD;  Location: [place];  Service: Orthopedics;  Laterality: Right;   PERIPHERALLY INSERTED CENTRAL CATHETER INSERTION  09/02/2015   POLYPECTOMY     ROTATOR CUFF REPAIR Left    x 2   ROTATOR CUFF REPAIR Right    x 3   SHOULDER ARTHROSCOPY W/ ROTATOR CUFF REPAIR Bilateral    "3 on the right; 1 on the left"   SKIN SPLIT GRAFT Right 10/01/2015   Procedure: RIGHT ANKLE APPLY SKIN GRAFT SPLIT THICKNESS;  Surgeon: Newt Minion, MD;  Location: [place];  Service: Orthopedics;  Laterality: Right;   TONSILLECTOMY     TOOTH EXTRACTION     TOTAL HIP ARTHROPLASTY Right 04/17/2018   TOTAL HIP ARTHROPLASTY Right 04/17/2018   Procedure: RIGHT TOTAL HIP ARTHROPLASTY;  Surgeon: Newt Minion, MD;  Location: [place];  Service: Orthopedics;  Laterality: Right;   TOTAL HIP ARTHROPLASTY Left 06/06/2019   Procedure: LEFT TOTAL HIP ARTHROPLASTY ANTERIOR APPROACH;  Surgeon: Mcarthur Rossetti, MD;  Location: [place];  Service: Orthopedics;  Laterality: Left;   TOTAL HIP REVISION Left  05/24/2019   TOTAL KNEE ARTHROPLASTY Bilateral 2023   UMBILICAL HERNIA REPAIR     UHR   UPPER GASTROINTESTINAL ENDOSCOPY  2016   URETHRAL DILATION  X 4   VASECTOMY     WISDOM TOOTH EXTRACTION     Social History   Occupational History   Occupation: disabled since 2006 due to ortho. heart, Air traffic controller: UNEMPLOYED   Occupation: part time work as an Multimedia programmer, wrestling, and baseball coach [employer]  Tobacco Use   Smoking status: Former    Types: Cigars    Quit date: 08/28/2010    Years since quitting: 10.3   Smokeless tobacco: Never   Tobacco comments:    04/18/2016 "smoked 1 cigar/wk when I did smoke"  Vaping Use   Vaping Use: Never used  Substance and Sexual Activity   Alcohol use: Yes    Alcohol/week: 0.0 standard drinks  Comment: occasionally   Drug use: No   Sexual activity: Not Currently

## 2021-01-13 DIAGNOSIS — M533 Sacrococcygeal disorders, not elsewhere classified: Secondary | ICD-10-CM | POA: Diagnosis not present

## 2021-01-14 ENCOUNTER — Encounter: Payer: Self-pay | Admitting: Internal Medicine

## 2021-01-14 ENCOUNTER — Other Ambulatory Visit: Payer: Self-pay

## 2021-01-14 ENCOUNTER — Ambulatory Visit (INDEPENDENT_AMBULATORY_CARE_PROVIDER_SITE_OTHER): Payer: Medicare Other

## 2021-01-14 ENCOUNTER — Ambulatory Visit (INDEPENDENT_AMBULATORY_CARE_PROVIDER_SITE_OTHER): Payer: Medicare Other | Admitting: Internal Medicine

## 2021-01-14 VITALS — BP 156/74 | HR 65 | Temp 98.8°F | Ht 73.0 in | Wt 303.0 lb

## 2021-01-14 DIAGNOSIS — E7849 Other hyperlipidemia: Secondary | ICD-10-CM

## 2021-01-14 DIAGNOSIS — J309 Allergic rhinitis, unspecified: Secondary | ICD-10-CM

## 2021-01-14 DIAGNOSIS — I2510 Atherosclerotic heart disease of native coronary artery without angina pectoris: Secondary | ICD-10-CM

## 2021-01-14 DIAGNOSIS — F5104 Psychophysiologic insomnia: Secondary | ICD-10-CM

## 2021-01-14 DIAGNOSIS — R0600 Dyspnea, unspecified: Secondary | ICD-10-CM | POA: Diagnosis not present

## 2021-01-14 DIAGNOSIS — I10 Essential (primary) hypertension: Secondary | ICD-10-CM | POA: Diagnosis not present

## 2021-01-14 MED ORDER — CETIRIZINE HCL 10 MG PO TABS: 10.0000 mg | ORAL_TABLET | Freq: Every day | ORAL | 11 refills | Status: DC

## 2021-01-14 MED ORDER — BUDESONIDE-FORMOTEROL FUMARATE 160-4.5 MCG/ACT IN AERO: 2.0000 | INHALATION_SPRAY | Freq: Two times a day (BID) | RESPIRATORY_TRACT | 3 refills | Status: DC

## 2021-01-14 MED ORDER — PREDNISONE 10 MG PO TABS: ORAL_TABLET | ORAL | 0 refills | Status: DC

## 2021-01-14 MED ORDER — TESTOSTERONE 1.62 % TD GEL: 1.0000 | Freq: Every day | TRANSDERMAL | 1 refills | Status: DC

## 2021-01-14 NOTE — Progress Notes (Signed)
Chronic Care Management Pharmacy Note  01/14/2021 Name:  Travis HABIB Sr. MRN:  [id number] DOB:  August 24, 1950  Summary: - Patient reports that recently he has been dealing with more shortness of breath, was in to see PCP prior to appointment, believed to be due to allergies, started on symbicort and zyrtec as well as a short course of prednisone - Continues to follow with different pain management providers, has appointment with ortho next month to discuss shoulder replacement surgery, is meeting with Dr. Posey Pronto at the end of the month for a possible ablation, and is going to see Dr. Louanne Skye next month to discuss his medications  -Reports that he is developing a rash from butrans batches - has tried spraying fluticasone under application site without relief  -reports to checking blood pressure at home, has been well contorlled averaging 130/70's, reports to no issues with hypotension -Continues with follow with a therapist every week, is working to find a new psych provider as he does not feel he has been progressing in terms of his mental health as much as he would like to  Recommendations/Changes made from today's visit: -Recommending for patient to ask for a different manufacturer of butrans patches, possible it is the adhesive from current manufacturer that is causing the irritation -Patient to continue checking blood pressures daily and reach out should BP elevate >140/90 -Patient to monitor symptoms of shortness of breath / allergies, will reach out should addition of symbicort and zyrtec not improve breathing   Subjective: Travis [name]. is an 70 y.o. year old male who is a primary patient of Jenny Reichmann, [name], MD.  The CCM team was consulted for assistance with disease management and care coordination needs.    Engaged with patient face to face for initial visit in response to provider referral for pharmacy case management and/or care coordination services.   Consent to  Services:  The patient was given the following information about Chronic Care Management services today, agreed to services, and gave verbal consent: 1. CCM service includes personalized support from designated clinical staff supervised by the primary care provider, including individualized plan of care and coordination with other care providers 2. 24/7 contact phone numbers for assistance for urgent and routine care needs. 3. Service will only be billed when office clinical staff spend 20 minutes or more in a month to coordinate care. 4. Only one practitioner may furnish and bill the service in a calendar month. 5.The patient may stop CCM services at any time (effective at the end of the month) by phone call to the office staff. 6. The patient will be responsible for cost sharing (co-pay) of up to 20% of the service fee (after annual deductible is met). Patient agreed to services and consent obtained.  Patient Care Team: [name], MD as PCP - General (Internal Medicine) Burnell Blanks, MD as PCP - Cardiology (Cardiology) [name], MD as Attending Physician (Internal Medicine) Newt Minion, MD as Consulting Physician (Orthopedic Surgery) Greer Pickerel, MD as Consulting Physician (General Surgery) Pyrtle, Lajuan Lines, MD as Consulting Physician (Gastroenterology) Jessy Oto, MD as Consulting Physician (Orthopedic Surgery) Tat, Eustace Quail, DO as Consulting Physician (Neurology) Kathlee Nations, MD as Consulting Physician (Psychiatry) Park Breed, MD as Referring Physician Sonora Catlin, Darnelle Maffucci, [facility] as Pharmacist (Pharmacist)  Recent office visits:  12/01/20-James Quin Hoop, MD (PCP) Annual exam. Labs ordered. Follow up in 6 months.  11/02/20-James Quin Hoop, MD (PCP, Video Visit) Seen for  Sinusitis. 10/11/20-James Quin Hoop, MD (PCP) Seen for memory changes. Start on losartan 50 mg for BP and ARICEPT 5 MG tablet. Referral for Neurology. Return if symptoms worsen or fail to  improve. 09/17/20-James Quin Hoop, MD (PCP) General follow up. Pneumovax pneumonia shot given. Start Prednisone 10 mg. Referral to Opthalmology. Follow up in 3 months. 07/05/20-James Quin Hoop, MD (PCP) Seen for hard to manage saliva. Stop the afrin. Return if symptoms worsen or fail to improve.  Recent consult visits:  12/27/20-Gregory Alphonzo Severance, MD (Orthopedics) Left shoulder pain. 12/20/20-Jane Pierce Crane (Pain Medicine) Seen for left shoulder pain. 12/15/20-James Howell Pringle, MD (Orthopedics) Seen for spine pain. X-ray of lumbar spine completed.  12/02/20-Jane Pierce Crane (Pain Medicine) Seen for back pain and shoulder pain. Follow up in 12 weeks. 11/19/20-Erin Domenic Polite, NP (Orthopedics) Seen for Leg length discrepancy.  11/16/20-Dwight Unice Bailey (Otolaryngology)  11/10/20-Syed T. Adele Schilder, MD [facility]) Notes not available.  10/27/20-Dwight Unice Bailey (Otolaryngology) Seen for Nasal polyps. 10/27/20-James Howell Pringle, MD (Orthopedics) Seen for lower back. X-ray lumbar spine. Follow up in 4 weeks. 09/16/20-James M. Ricard Dillon, PA-C (Orthopedics) Seen for lower back pain. Follow up in 6 weeks. 09/06/20-Jane Pierce Crane (Pain Medicine) Seen for Back pain and leg pain. Follow up in 3 months. 08/30/20-Benjamin Ardis Hughs (Urology) Notes not available.  08/23/20-Saad Amin (Internal Medicine) Notes not available. 08/17/20-Asim Colleen Can (Physical Medicine and Rehabilitation) Notes not available. 08/12/20-Saad Amin (Internal Medicine) Notes not available.  08/11/20-James E. Nitka,MD (Orthopedics) Seen for back pain. Follow up in 3 weeks. 08/09/20-Saad Amin (Internal Medicine) Notes not available. 08/03/20-Neijia Grissom (Internal Medicine) Notes not available.  08/03/20-Asim Colleen Can  (Physical Medicine and Rehab) Notes not available. 07/26/20-Christopher Santina Evans (Cardiology) Follow up visit. EKG completed. Follow up in 1 year. 07/15/20-James Howell Pringle, MD (Orthopedics) Seen for  Spinal pain. Surgery recommended is a five level lumbar fusion L1-2, L2-3, L3-4, L4-5 and L5-S1this would be done with rods, screws and cages with local bone graft and allograft (donor bone graft). will need to use a brace for 3 months and wean from the brace on the 4th month. Follow up in 4 weeks. 07/08/20-Jane Pierce Crane (Pain Medicine) Seen for back pain. Follow up in 2 months. Hospital visits:  Medication Reconciliation was completed by comparing discharge summary, patient's EMR and Pharmacy list, and upon discussion with patient.   Admitted to the hospital on 07/27/20 due to Congenital spinal stenosis of lumbar region - spinal fusion surgery. patient underwent right L1-2 and Left L2-3, L3-4, L4-5 and L5-S1 facetectomy decompressing the areas of spondylosis causing foramenal stenosis. Discharge date was 08/02/20. Discharged from [place]?Medications Started at [facility] Discharge:?? -started None noted   Medication Changes at Hospital Discharge: -Changed None noted   Medications Discontinued at Hospital Discharge: -Stopped None noted   Medications that remain the same after Hospital Discharge:??  -All other medications will remain the same.    Objective:  Lab Results  Component Value Date   CREATININE 1.23 12/01/2020   BUN 21 12/01/2020   GFR 60.32 12/01/2020   GFRNONAA >60 07/28/2020   GFRAA >60 06/07/2019   NA 139 12/01/2020   K 4.3 12/01/2020   CALCIUM 9.1 12/01/2020   CO2 32 12/01/2020   GLUCOSE 84 12/01/2020    Lab Results  Component Value Date/Time   HGBA1C 6.1 12/01/2020 01:48 PM   HGBA1C 5.7 (A) 02/24/2020 04:34 PM   HGBA1C 5.9 02/24/2020 04:13 PM   GFR 60.32 12/01/2020  01:48 PM   GFR 63.80 02/24/2020 04:13 PM   MICROALBUR <0.7 07/24/2019 02:47 PM   MICROALBUR 1.6 06/03/2018 01:33 PM    Last diabetic Eye exam:  Lab Results  Component Value Date/Time   HMDIABEYEEXA No Retinopathy 11/30/2020 12:00 AM    Last diabetic Foot  exam:  No results found for: HMDIABFOOTEX   Lab Results  Component Value Date   CHOL 113 12/01/2020   HDL 42.30 12/01/2020   LDLCALC 54 12/01/2020   TRIG 85.0 12/01/2020   CHOLHDL 3 12/01/2020    Hepatic Function Latest Ref Rng & Units 12/01/2020 07/23/2020 02/24/2020  Total Protein 6.0 - 8.3 g/dL 6.6 5.9(L) 6.9  Albumin 3.5 - 5.2 g/dL 4.1 3.5 4.3  AST 0 - 37 U/L _0 ALT 0 - 53 U/L _1 Alk Phosphatase 39 - 117 U/L 58 51 57  Total Bilirubin 0.2 - 1.2 mg/dL 0.4 0.7 0.4  Bilirubin, Direct 0.0 - 0.3 mg/dL 0.1 - -    Lab Results  Component Value Date/Time   TSH 2.52 02/24/2020 04:13 PM   TSH 1.58 07/24/2019 02:47 PM    CBC Latest Ref Rng & Units 12/01/2020 07/31/2020 07/30/2020  WBC 4.0 - 10.5 K/uL 3.4(L) 6.7 7.4  Hemoglobin 13.0 - 17.0 g/dL 12.0(L) 10.7(L) 9.4(L)  Hematocrit 39.0 - 52.0 % 35.4(L) 31.3(L) 26.3(L)  Platelets 150.0 - 400.0 K/uL 173.0 247 194    Lab Results  Component Value Date/Time   VD25OH 53.07 12/01/2020 01:48 PM   VD25OH 29.84 (L) 02/24/2020 04:13 PM    Clinical ASCVD: Yes  The ASCVD Risk score Mikey Bussing DC Jr., et al., 2013) failed to calculate for the following reasons:   The patient has a prior MI or stroke diagnosis    Depression screen [facility] 2/9 09/17/2020 09/17/2020 07/24/2019  Decreased Interest 0 3 0  Down, Depressed, Hopeless 0 3 0  PHQ - 2 Score 0 6 0  Altered sleeping - 0 -  Tired, decreased energy - 3 -  Change in appetite - 3 -  Feeling bad or failure about yourself  - 3 -  Trouble concentrating - 3 -  Moving slowly or fidgety/restless - 0 -  Suicidal thoughts - 0 -  PHQ-9 Score - 18 -  Some recent data might be hidden   Social History   Tobacco Use  Smoking Status Former   Types: Cigars   Quit date: 08/28/2010   Years since quitting: 10.3  Smokeless Tobacco Never  Tobacco Comments   04/18/2016 "smoked 1 cigar/wk when I did smoke"   BP Readings from Last 3 Encounters:  01/14/21 (!) 156/74  12/15/20 109/73  12/01/20 134/78    Pulse Readings from Last 3 Encounters:  01/14/21 65  12/15/20 65  12/01/20 (!) 52   Wt Readings from Last 3 Encounters:  01/14/21 (!) 303 lb (137.4 kg)  12/15/20 (!) 312 lb (141.5 kg)  12/01/20 (!) 317 lb 3.2 oz (143.9 kg)   BMI Readings from Last 3 Encounters:  01/14/21 39.98 kg/m  12/15/20 41.16 kg/m  12/01/20 43.02 kg/m    Assessment/Interventions: Review of patient past medical history, allergies, medications, health status, including review of consultants reports, laboratory and other test data, was performed as part of comprehensive evaluation and provision of chronic care management services.   SDOH:  (Social Determinants of Health) assessments and interventions performed: Yes  SDOH Screenings   Alcohol Screen: Not on file  Depression (PHQ2-9): Low Risk    PHQ-2 Score:  0  Financial Resource Strain: Low Risk    Difficulty of Paying Living Expenses: Not very hard  Food Insecurity: Not on file  Housing: Not on file  Physical Activity: Not on file  Social Connections: Not on file  Stress: Not on file  Tobacco Use: Medium Risk   Smoking Tobacco Use: Former   Smokeless Tobacco Use: Never  Transportation Needs: Not on file    [place]  No Known Allergies  Medications Reviewed Today     Reviewed by Tomasa Blase, [facility] (Pharmacist) on 01/14/21 at 1548  Med List Status: <None>   Medication Order Taking? Sig Documenting Provider Last Dose Status Informant  albuterol (VENTOLIN HFA) 108 (90 Base) MCG/ACT inhaler [id number] Yes Inhale 2 puffs into the lungs every 6 (six) hours as needed for wheezing or shortness of breath. [name], MD Taking Active   ARIPiprazole (ABILIFY) 10 MG tablet [id number] Yes Take 1 tablet (10 mg total) by mouth daily. Kathlee Nations, MD Taking Active   aspirin 81 MG chewable tablet [id number] Yes Chew 1 tablet (81 mg total) by mouth daily. Richardson Dopp T, PA-C Taking Active Self  atorvastatin (LIPITOR) 20 MG tablet [id number] Yes  Take 1 tablet (20 mg total) by mouth daily.  Patient taking differently: Take 20 mg by mouth daily.   Burnell Blanks, MD Taking Active   Azelastine-Fluticasone 137-50 MCG/ACT Elizbeth Squires [id number] Yes Use both nostrils as directed twice daily [name], MD Taking Active   benztropine (COGENTIN) 0.5 MG tablet [id number] Yes Take 1 tablet (0.5 mg total) by mouth at bedtime. Kathlee Nations, MD Taking Active   budesonide-formoterol [facility]) 160-4.5 MCG/ACT inhaler [id number]  Inhale 2 puffs into the lungs 2 (two) times daily. [name], MD  Active   buprenorphine [facility]) 20 MCG/HR [place] [id number] Yes Place 1 patch onto the skin once a week. [provider] Taking Active   calcium carbonate (TUMS - DOSED IN MG ELEMENTAL CALCIUM) 500 MG chewable tablet [id number] Yes Chew 1 tablet by mouth 3 (three) times daily with meals. [provider] Taking Active Self  celecoxib (CELEBREX) 200 MG capsule [id number] Yes Take 1 capsule (200 mg total) by mouth 2 (two) times daily. Jessy Oto, MD Taking Active   cetirizine (ZYRTEC) 10 MG tablet [id number]  Take 1 tablet (10 mg total) by mouth daily. [name], MD  Active   cholecalciferol (VITAMIN D3) 25 MCG (1000 UNIT) tablet [id number] Yes Take 1,000 Units by mouth daily. [provider] Taking Active   donepezil (ARICEPT) 5 MG tablet [id number] Yes Take 1 tablet (5 mg total) by mouth at bedtime. [name], MD Taking Active   DULoxetine HCl 40 MG CPEP [id number] Yes Take 40 mg by mouth 2 (two) times daily. Kathlee Nations, MD Taking Active   gabapentin (NEURONTIN) 800 MG tablet [id number] Yes Take 800 mg by mouth 3 (three) times daily. Jessy Oto, MD Taking Active   hydrochlorothiazide (HYDRODIURIL) 25 MG tablet [id number] Yes Take 1 tablet (25 mg total) by mouth daily.  Patient taking differently: Take 25 mg by mouth daily.   Burnell Blanks, MD Taking Active   HYDROcodone-acetaminophen [facility]) 10-325 MG tablet  [id number] Yes Take 1 tablet by mouth every 8 (eight) hours as needed. [provider] Taking Active   iron polysaccharides (NU-IRON) 150 MG capsule [id number] Yes Take 1 capsule (150 mg total) by mouth daily.  Patient taking differently: Take 300 mg by mouth daily.  [name], MD Taking Active Self  losartan (COZAAR) 50 MG tablet [id number] Yes Take 1 tablet (50 mg total) by mouth daily. [name], MD Taking Active   methocarbamol (ROBAXIN) 500 MG tablet [id number] Yes Take 1 tablet (500 mg total) by mouth every 8 (eight) hours as needed for muscle spasms. [name], MD Taking Active   Omega-3 Fatty Acids (FISH OIL) 1000 MG CAPS [id number] Yes Take 1,000 mg by mouth at bedtime.  [provider] Taking Active Self  pantoprazole (PROTONIX) 40 MG tablet [id number] Yes Take 40 mg by mouth daily. [provider] Taking Active   phentermine 37.5 MG capsule [id number] No Take 1 capsule (37.5 mg total) by mouth every morning.  Patient not taking: Reported on 01/14/2021   [name], MD Not Taking Active   prasugrel (EFFIENT) 5 MG TABS tablet [id number] Yes Take 1 tablet (5 mg total) by mouth daily. Burnell Blanks, MD Taking Active Self  predniSONE (DELTASONE) 10 MG tablet [id number]  3 tabs by mouth per day for 3 days,2tabs per day for 3 days,1tab per day for 3 days [name], MD  Active   sildenafil (VIAGRA) 100 MG tablet [id number] No Take 100 mg by mouth daily as needed for erectile dysfunction.   Patient not taking: Reported on 01/14/2021   [provider] Not Taking Active Self  tamsulosin (FLOMAX) 0.4 MG CAPS capsule [id number] Yes Take 0.4 mg by mouth 2 (two) times daily. [provider] Taking Active Self  Testosterone 1.62 % GEL [id number] Yes Place 1 Pump onto the skin daily. [name], MD Taking Active   traZODone (DESYREL) 150 MG tablet [id number] Yes Take 1 tablet (150 mg total) by mouth at bedtime. Kathlee Nations, MD Taking  Active   ZENPEP [phone] units CPEP [id number] Yes Take 2 capsules (80,000 units) with meals and 1 capsule (40,000 units) with each snack  Patient taking differently: Take 1-2 capsules by mouth See admin instructions. Take 2 capsules (80,000 units) with meals and 1 capsule (40,000 units) with each snack   Armbruster, Carlota Raspberry, MD Taking Active Self  zolpidem (AMBIEN) 10 MG tablet [id number] Yes Take 1 tablet (10 mg total) by mouth at bedtime as needed for sleep. [name], MD Taking Active   Med List Note Dessie Coma, CPhT 08/15/18 1756): Alphonzo Cruise (niece- lives with her) [phone]            Patient Active Problem List   Diagnosis Date Noted   Dyspnea 01/14/2021   Sinusitis 11/02/2020   Memory loss or impairment 10/11/2020   Right arm pain 09/18/2020   Aortic atherosclerosis ([place]) 09/16/2020   Spondylolisthesis, lumbar region    Status post lumbar spinal fusion 07/27/2020   Nasal sinus polyp 06/16/2020   Vitamin D deficiency 02/24/2020   Cough 07/29/2019   Wheezing 07/29/2019   Possible exposure to STD 07/24/2019   Unilateral primary osteoarthritis, left hip 06/06/2019   Status post total replacement of left hip 06/06/2019   S/P laparoscopic sleeve gastrectomy 03/03/2019   Rash 01/24/2019   Fever 08/15/2018   UTI (urinary tract infection) 08/15/2018   Fall at home, initial encounter 08/15/2018   Chronic anemia 08/15/2018   Hypokalemia 08/15/2018   Bowel incontinence 07/25/2018   Other spondylosis with radiculopathy, lumbar region 07/16/2018    Class: Chronic   Status post lumbar laminectomy 07/16/2018   Status post THR (total hip replacement) 04/17/2018   Unilateral primary osteoarthritis, right hip  Morbid obesity with BMI of 40.0-44.9, adult ([place]) 02/28/2018   Myocardial infarction ([place]) 02/25/2018   Coronary artery disease involving native coronary artery of native heart 02/25/2018   PAD (peripheral artery disease) ([place]) 02/25/2018   S/P BKA (below  knee amputation) unilateral, right ([place]) 02/25/2018   Sacroiliitis ([place]) 02/25/2018   SOB (shortness of breath) 01/03/2018   Acute on chronic heart failure ([place]) 01/03/2018   Severe right groin pain 12/20/2017   Morbid obesity ([place]) 10/09/2017   Low back pain 07/13/2017   Leukoplakia, tongue 01/26/2017   Skin lesion 10/20/2016   S/P unilateral BKA (below knee amputation), right ([place]) 06/14/2016   Charcot foot due to diabetes mellitus ([place])    Charcot's arthropathy associated with type 2 diabetes mellitus ([place]) 04/11/2016   Encounter for well adult exam with abnormal findings 11/05/2015   Major depression 09/13/2015   S/P TKR (total knee replacement) bilaterally 09/13/2015   GERD (gastroesophageal reflux disease) 09/08/2015   S/P laparoscopic hernia repair 05/11/2015   PPD positive 04/08/2015   Benign neoplasm of descending colon    Benign neoplasm of cecum    Acute blood loss as cause of postoperative anemia    Chronic anticoagulation    Occult blood positive stool 10/17/2014   General weakness 07/14/2014   Urinary incontinence 07/14/2014   Headache(784.0) 10/15/2013   Spinal stenosis in cervical region 09/26/2013    Class: Chronic   Congenital spinal stenosis of lumbar region 09/26/2013    Class: Chronic   Hand joint pain 06/10/2013   Rotator cuff tear arthropathy of both shoulders 06/10/2013   Skin lesion of cheek 05/01/2013   Pain of right thumb 04/03/2013   Balance disorder 03/12/2013   Gait disorder 03/12/2013   Tremor 03/12/2013   Left hip pain 03/12/2013   Pre-ulcerative corn or callous 02/06/2013   Anxiety 11/12/2011   OSA on CPAP 11/07/2011   Bradycardia 10/20/2011   Insomnia 10/04/2011   Obesity 01/12/2011   Diabetes mellitus type 2 in obese ([place]) 09/27/2010   ERECTILE DYSFUNCTION, ORGANIC 05/30/2010   Degenerative disc disease, lumbar 04/19/2010   SCIATICA, LEFT 04/19/2010   Chronic pain syndrome 10/27/2009   Hyperlipidemia 07/15/2009   Essential  hypertension 06/24/2009   Coronary artery disease involving native coronary artery of native heart without angina pectoris 06/24/2009   Allergic rhinitis 06/24/2009   URETHRAL STRICTURE 06/24/2009   DEGENERATIVE JOINT DISEASE 06/24/2009   SHOULDER PAIN, BILATERAL 06/24/2009   FATIGUE 06/24/2009   NEPHROLITHIASIS, HX OF 06/24/2009    Immunization History  Administered Date(s) Administered   Fluad Quad(high Dose 65+) 01/24/2019, 02/24/2020   Influenza Split 06/05/2011, 06/26/2012   Influenza Whole 03/14/2010   Influenza,inj,Quad PF,6+ Mos 02/09/2013, 04/16/2014, 03/11/2015, 04/19/2016, 01/26/2017   Influenza-Unspecified 05/23/2018   PFIZER(Purple Top)SARS-COV-2 Vaccination 08/22/2019, 09/15/2019, 05/12/2020   PPD Test 10/04/2011, 04/07/2015   Pneumococcal Conjugate-13 04/02/2015   Pneumococcal Polysaccharide-23 02/19/2009, 06/05/2011, 01/28/2014, 09/17/2020   Td 05/22/2004   Tdap 10/17/2014    Conditions to be addressed/monitored:  Hypertension, Hyperlipidemia, GERD, Depression, Anxiety, Bipolar Disorder, BPH, Allergic Rhinitis, Chronic Pain, Low Testosterone, Tremors, Memory Loss, Bowel Incontinency, and Obesity    Care Plan : CCM Care Plan  Updates made by Tomasa Blase, RPH since 01/14/2021 12:00 AM     Problem: Hypertension, Hyperlipidemia, GERD, Depression, Anxiety, Bipolar Disorder, BPH, Allergic Rhinitis, Chronic Pain, Low Testosterone, Tremors, Memory Loss, Bowel Incontinency, and Obesity   Priority: High  Onset Date: 01/14/2021     Long-Range Goal: Disease Management   Start Date: 01/14/2021  Expected End Date: 07/17/2021  This Visit's Progress: On track  Priority: High  Note:   Current Barriers:  Unable to independently monitor therapeutic efficacy  Pharmacist Clinical Goal(s):  Patient will achieve adherence to monitoring guidelines and medication adherence to achieve therapeutic efficacy through collaboration with PharmD and provider.   Interventions: 1:1  collaboration with [name], MD regarding development and update of comprehensive plan of care as evidenced by provider attestation and co-signature Inter-disciplinary care team collaboration (see longitudinal plan of care) Comprehensive medication review performed; medication list updated in electronic medical record  Hypertension (BP goal <140/90) -Controlled -Current treatment: Losartan 52m - 1 tablet daily Hydrochlorothiazide 267m-1 tablet daily -Medications previously tried: amlodipine, atenolol, benazepril, isosorbide mononitrate,   -Current home readings: averaging 130/70 -Current dietary habits: eats a low sodium  -Current exercise habits: none at this time, due to back pain  -Denies hypotensive/hypertensive symptoms -Educated on BP goals and benefits of medications for prevention of heart attack, stroke and kidney damage; Daily salt intake goal < 2300 mg; Importance of home blood pressure monitoring; Proper BP monitoring technique; Symptoms of hypotension and importance of maintaining adequate hydration; -Counseled to monitor BP at home daily, document, and provide log at future appointments -Counseled on diet and exercise extensively Recommended to continue current medication  Hyperlipidemia/ Previous MI/ CAD: (LDL goal < 70) -Controlled Lab Results  Component Value Date   LDLCALC 54 12/01/2020  -Current treatment: Atorvastatin 2057m 1 tablet daily Fish Oils 1000m18m1 capsule daily Aspirin 81mg8m tablet daily  Prasugrel 5mg -4mtablet daily  -Medications previously tried: n/a  -Current dietary patterns: seafood / lean meat / fresh vegetables -Current exercise habits: none at this time due to back pain -Educated on Cholesterol goals;  Benefits of statin for ASCVD risk reduction; Importance of limiting foods high in cholesterol; -Counseled on diet and exercise extensively Recommended to continue current medication  Depression/Dipolar Disorder / Insomnia  (Goal: Promotion of stable mood / quality sleep) -Not ideally controlled - following with Dr. ArfeenAdele Schilder speaks with a therapist once weekly -Current treatment: Aripiprazole 10mg -7mablet daily  Duloxetine 40mg - 29mpsule twice daily   Zolpidem 10mg - 168mlet daily  Trazodone 150mg - 1 77met daily   -Medications previously tried/failed: bupropion, chlorpromazine, fluoxetine, mirtazapine, depakote, clonazepam -Educated on Benefits of medication for symptom control Benefits of cognitive-behavioral therapy with or without medication -Patient states he is working with therapist to find a new psychiatrist, is hoping to start with a new provider within the next 1-2 months   GERD (Goal: Prevention of acid reflux) -Controlled -Current treatment  Pantoprazole 40mg - 1 t51mt daily  Tums 500mg - 1 ta63m with each meal  -Medications previously tried: n/a  -Counseled on diet and exercise extensively Recommended to continue current medication  Chronic Pain (Goal: Pain control) - Managed by James Nitka [name] -Current treatment  Methocarbamol 500mg - 1 tab61mevery 8 hours as needed  - taking 3-4 times a week Gabapentin 800mg - 1 tabl40m times daily  Hydrocodone-APAP 10-325mg - 1 table36mtimes daily  Butrans 20mcg/hr patch 74mpatch weekly (breaking out in hives currently) Celecoxib 200mg - 1 capsule98mice daily  -Medications previously tried: tylenol #4, ibuprofen, indomethacin, opana, oxycodone, percocet, tramadol , cyclobenzaprine, lyrica, tizanidine,  -Recommended to continue current medication  BPH (Goal: Prevention of disease progression/ management of urinary symptoms) -Controlled -Current treatment  Tamsulosin 0.4mg - 1 capsule d60my  -  Medications previously tried: myrbetriq, oxybutynin  -Recommended to continue current medication  Tremors (Goal: Prevention of abnormal movements) -Controlled -Current treatment  Benztropine 0.58m - 1 tablet at bedtime  -Medications  previously tried: n/a  -Recommended to continue current medication  Bowel Incontinence (Goal: Promotion of appropriate bowel movements) -Controlled -Current treatment  Zenpep 40000-126000 units -2 capsules with meals and 1 capsule with snacks -Medications previously tried: amitiza, bisacodyl, docusate, miralax  -Recommended to continue current medication  Obesity (Goal: Promotion of healthy/ sustained weight loss) -Controlled - patient reports that he has noticed improvement in weight since starting -Current treatment  Phentermine 37.519m- 1 capsule daily - not currently taking at this time due to cost  -Medications previously tried: n/a  -Counseled on diet and exercise extensively as he is able   Memory Loss (Goal: Prevention of disease progression) -Controlled -Current treatment  Donepezil 40m68m 1 tablet daily  -Medications previously tried: n/a  -Recommended to continue current medication  Low Testosterone Levels (Goal: Maintenance of appropriate testosterone levels) -Controlled -Last Testosterone level: 258.98 ng/dL (07/24/2019) -Current treatment  Testosterone 1.62% - 1 pump on the skin daily  -Medications previously tried: n/a  -Recommended to continue current medication  Allergic Rhinitis (Goal: Prevention and treatment of allergy symptoms) -Not ideally controlled -Current treatment  Azelastine-Fluticasone 137-24m69mct - 1 spray in both nostrils daily Symbicort 160-4.40mcg61m2 puffs twice daily  Albuterol 108mcg107m - 2 puffs every 6 hours as needed  Cetirizine 10mg -21mablet daily  -Medications previously tried: fluticasone, afrin, nasacort,  -Recommended for patient to start new medications prescribed with appointment today, explained the different between his two different inhalers and when to use each of the inhalers, patient to monitor symptoms of shortness of breath/ allergies   Health Maintenance -Vaccine gaps: COVID booster and flu vaccine  -Current therapy:   Iron polysaccharides 300mg - 24mpsule daily Sildenafil 100mg - 159mlet daily as needed   Vitamin D 1000 units-  1 tablet daily Last Vitamin D level 53.07 ng/mL (12/01/2020)  -Educated on Cost vs benefit of each product must be carefully weighed by individual consumer -Patient is satisfied with current therapy and denies issues -Recommended to continue current medication  Patient Goals/Self-Care Activities Patient will:  - take medications as prescribed  Follow Up Plan: Telephone follow up appointment with care management team member scheduled for: The patient has been provided with contact information for the care management team and has been advised to call with any health related questions or concerns.        Medication Assistance: None required.  Patient affirms current coverage meets needs.   Patient's preferred pharmacy is:  [phone] box? No - has a daytime / night time bin of his medications, uses old pill bottles and fills up morning, afternoon, evening, and nighttime medications to take  Pt endorses 100% compliance  Care Plan and Follow Up Patient Decision:  Patient agrees to Care Plan and Follow-up.  Plan: Telephone follow up appointment with care management team member scheduled for:  3 months and The patient has been provided with contact information for the care management team and has been advised to call with any health related questions or concerns.   Kase Shughart C Tomasa BlaseClinical Pharmacist, [facility] [facility]

## 2021-01-14 NOTE — Patient Instructions (Signed)
Please take all new medication as prescribed - the prednisone, zyrtec, and symbicort  Please continue all other medications as before, including the albuterol as needed, and flonase  Please have the pharmacy call with any other refills you may need.  Please continue your efforts at being more active, low cholesterol diet, and weight control.  You are otherwise up to date with prevention measures today.  Please keep your appointments with your specialists as you may have planned - ENT for sinus on Sept 19  You will be contacted regarding the referral for: [facility] for the Overnight Oximetry, and PFT's (pulmonary function tests)

## 2021-01-14 NOTE — Patient Instructions (Signed)
Visit Information   PATIENT GOALS:   Goals Addressed             This Visit's Progress    Manage Chronic Pain       Timeframe:  Long-Range Goal Priority:  High Start Date:  01/14/2021                           Expected End Date:  07/17/2021                     Follow Up Date 04/16/2021   - call for medicine refill 2 or 3 days before it runs out - develop a personal pain management plan - keep track of prescription refills - prioritize tasks for the day - track times pain is worst and when it is best - track what makes the pain worse and what makes it better - use ice or heat for pain relief - work slower and less intense when having pain    Why is this important?   Day-to-day life can be hard when you have chronic pain.  Pain medicine is just one piece of the treatment puzzle.  You can try these action steps to help you manage your pain.      Track and Manage My Blood Pressure-Hypertension       Timeframe:  Long-Range Goal Priority:  High Start Date:   01/14/2021                          Expected End Date:  07/17/2021                     Follow Up Date 04/16/2021   - check blood pressure daily - choose a place to take my blood pressure (home, clinic or office, retail store) - write blood pressure results in a log or diary    Why is this important?   You won't feel high blood pressure, but it can still hurt your blood vessels.  High blood pressure can cause heart or kidney problems. It can also cause a stroke.  Making lifestyle changes like losing a little weight or eating less salt will help.  Checking your blood pressure at home and at different times of the day can help to control blood pressure.  If the doctor prescribes medicine remember to take it the way the doctor ordered.  Call the office if you cannot afford the medicine or if there are questions about it.          Consent to CCM Services: Mr. Mccoin was given information about Chronic Care Management  services including:  CCM service includes personalized support from designated clinical staff supervised by his physician, including individualized plan of care and coordination with other care providers 24/7 contact phone numbers for assistance for urgent and routine care needs. Service will only be billed when office clinical staff spend 20 minutes or more in a month to coordinate care. Only one practitioner may furnish and bill the service in a calendar month. The patient may stop CCM services at any time (effective at the end of the month) by phone call to the office staff. The patient will be responsible for cost sharing (co-pay) of up to 20% of the service fee (after annual deductible is met).  Patient agreed to services and verbal consent obtained.   Patient verbalizes understanding of instructions provided today and  agrees to view in [place].   Telephone follow up appointment with care management team member scheduled for: 3 months The patient has been provided with contact information for the care management team and has been advised to call with any health related questions or concerns.   Tomasa Blase, PharmD Clinical Pharmacist, [place]    CLINICAL CARE PLAN: Patient Care Plan: CCM Care Plan     Problem Identified: Hypertension, Hyperlipidemia, GERD, Depression, Anxiety, Bipolar Disorder, BPH, Allergic Rhinitis, Chronic Pain, Low Testosterone, Tremors, Memory Loss, Bowel Incontinency, and Obesity   Priority: High  Onset Date: 01/14/2021     Long-Range Goal: Disease Management   Start Date: 01/14/2021  Expected End Date: 07/17/2021  This Visit's Progress: On track  Priority: High  Note:   Current Barriers:  Unable to independently monitor therapeutic efficacy  Pharmacist Clinical Goal(s):  Patient will achieve adherence to monitoring guidelines and medication adherence to achieve therapeutic efficacy through collaboration with PharmD and provider.    Interventions: 1:1 collaboration with [name], MD regarding development and update of comprehensive plan of care as evidenced by provider attestation and co-signature Inter-disciplinary care team collaboration (see longitudinal plan of care) Comprehensive medication review performed; medication list updated in electronic medical record  Hypertension (BP goal <140/90) -Controlled -Current treatment: Losartan 38m - 1 tablet daily Hydrochlorothiazide 216m-1 tablet daily -Medications previously tried: amlodipine, atenolol, benazepril, isosorbide mononitrate,   -Current home readings: averaging 130/70 -Current dietary habits: eats a low sodium  -Current exercise habits: none at this time, due to back pain  -Denies hypotensive/hypertensive symptoms -Educated on BP goals and benefits of medications for prevention of heart attack, stroke and kidney damage; Daily salt intake goal < 2300 mg; Importance of home blood pressure monitoring; Proper BP monitoring technique; Symptoms of hypotension and importance of maintaining adequate hydration; -Counseled to monitor BP at home daily, document, and provide log at future appointments -Counseled on diet and exercise extensively Recommended to continue current medication  Hyperlipidemia/ Previous MI/ CAD: (LDL goal < 70) -Controlled Lab Results  Component Value Date   LDLCALC 54 12/01/2020  -Current treatment: Atorvastatin 2041m 1 tablet daily Aspirin 13m7m1 tablet daily  Prasugrel 5mg 69m tablet daily  -Medications previously tried: n/a  -Current dietary patterns: seafood / lean meat / fresh vegetables -Current exercise habits: none at this time due to back pain -Educated on Cholesterol goals;  Benefits of statin for ASCVD risk reduction; Importance of limiting foods high in cholesterol; -Counseled on diet and exercise extensively Recommended to continue current medication  Depression/Dipolar Disorder / Insomnia (Goal:  Promotion of stable mood / quality sleep) -Not ideally controlled - following with Dr. ArfeeAdele Schildero speaks with a therapist once weekly -Current treatment: Aripiprazole 10mg 78mtablet daily  Duloxetine 40mg -61mapsule twice daily   Zolpidem 10mg - 40mblet daily  Trazodone 150mg - 197mlet daily   -Medications previously tried/failed: bupropion, chlorpromazine, fluoxetine, mirtazapine, depakote, clonazepam -Educated on Benefits of medication for symptom control Benefits of cognitive-behavioral therapy with or without medication -Patient states he is working with therapist to find a new psychiatrist, is hoping to start with a new provider within the next 1-2 months   GERD (Goal: Prevention of acid reflux) -Controlled -Current treatment  Pantoprazole 40mg - 1 60met daily  Tums 500mg - 1 t14mt with each meal  -Medications previously tried: n/a  -Counseled on diet and exercise extensively Recommended to continue current medication  Chronic Pain (  Goal: Pain control) - Managed by [name] -Controlled -Current treatment  Methocarbamol 558m - 1 tablet every 8 hours as needed  - taking 3-4 times a week Gabapentin 8068m- 1 tablet 3 times daily  Hydrocodone-APAP 10-32532m 1 tablet 3 times daily  Butrans 36m61mr patch - 1 patch weekly (breaking out in hives currently) Celecoxib 200mg39m capsule  twice daily  -Medications previously tried: tylenol #4, ibuprofen, indomethacin, opana, oxycodone, percocet, tramadol , cyclobenzaprine, lyrica, tizanidine,  -Recommended to continue current medication  BPH (Goal: Prevention of disease progression/ management of urinary symptoms) -Controlled -Current treatment  Tamsulosin 0.4mg -57mcapsule daily  -Medications previously tried: myrbetriq, oxybutynin  -Recommended to continue current medication  Tremors (Goal: Prevention of abnormal movements) -Controlled -Current treatment  Benztropine 0.5mg - 21mablet at bedtime  -Medications  previously tried: n/a  -Recommended to continue current medication  Bowel Incontinence (Goal: Promotion of appropriate bowel movements) -Controlled -Current treatment  Zenpep 40000-126000 units -2 capsules with meals and 1 capsule with snacks -Medications previously tried: amitiza, bisacodyl, docusate, miralax  -Recommended to continue current medication  Obesity (Goal: Promotion of healthy/ sustained weight loss) -Controlled - patient reports that he has noticed improvement in weight since starting -Current treatment  Phentermine 37.5mg - 131mpsule daily - not currently taking at this time due to cost  -Medications previously tried: n/a  -Counseled on diet and exercise extensively as he is able   Memory Loss (Goal: Prevention of disease progression) -Controlled -Current treatment  Donepezil 5mg - 1 40mlet daily  -Medications previously tried: n/a  -Recommended to continue current medication  Low Testosterone Levels (Goal: Maintenance of appropriate testosterone levels) -Controlled -Last Testosterone level: 258.98 ng/dL (07/24/2019) -Current treatment  Testosterone 1.62% - 1 pump on the skin daily  -Medications previously tried: n/a  -Recommended to continue current medication  Allergic Rhinitis (Goal: Prevention and treatment of allergy symptoms) -Not ideally controlled -Current treatment  Azelastine-Fluticasone 137-50mcg/act21m spray in both nostrils daily Symbicort 160-4.5mcg - 2 p34ms twice daily  Albuterol 108mcg/act -73muffs every 6 hours as needed  Cetirizine 10mg - 1 tab24mdaily  -Medications previously tried: fluticasone, afrin, nasacort,  -Recommended for patient to start new medications prescribed with appointment today, explained the different between his two different inhalers and when to use each of the inhalers, patient to monitor symptoms of shortness of breath/ allergies   Health Maintenance -Vaccine gaps: COVID booster and flu vaccine  -Current therapy:   Iron polysaccharides 300mg - 1 caps95mdaily Sildenafil 100mg - 1 table35mily as needed   Vitamin D 1000 units-  1 tablet daily Last Vitamin D level 53.07 ng/mL (12/01/2020)  -Educated on Cost vs benefit of each product must be carefully weighed by individual consumer -Patient is satisfied with current therapy and denies issues -Recommended to continue current medication  Patient Goals/Self-Care Activities Patient will:  - take medications as prescribed  Follow Up Plan: Telephone follow up appointment with care management team member scheduled for: The patient has been provided with contact information for the care management team and has been advised to call with any health related questions or concerns.

## 2021-01-14 NOTE — Progress Notes (Signed)
Patient ID: Travis Dyer., male   DOB: April 10, 1951, 70 y.o.   MRN: [id number]         Chief Complaint:: Shortness of Breath        HPI:  Travis Erman. is a 70 y.o. male here with 2 mo worsening sob -          Also lost 9 lbs with phentermine so happy with this; but having sob especially at 330 am most days wakes up with sob despite lying flat with CPAP.  Currently not on nocturnal home o2. During day c/o unable to breath deep as well.  Has chronic persistent cough and tends to wheeze in the neck area as he exhales as well.  Does have several wks ongoing nasal allergy symptoms with clearish congestion, itch and sneezing, without fever, pain, ST, cough, swelling, and no hx of known asthm.  Prednisone course 2 wks ago helped, but now worse again.  Daytime inhaler use not working,  Has not tried at night.  Of note has ENT f/u exam for sept 19.  Flonase helps somewhat with nasal symptoms.   Pt denies polydipsia, polyuria, or new focal neuro s/s.   Pt denies fever, night sweats, loss of appetite, or other constitutional symptoms    Wt Readings from Last 3 Encounters:  01/14/21 (!) 303 lb (137.4 kg)  12/15/20 (!) 312 lb (141.5 kg)  12/01/20 (!) 317 lb 3.2 oz (143.9 kg)   BP Readings from Last 3 Encounters:  01/14/21 (!) 156/74  12/15/20 109/73  12/01/20 134/78   Immunization History  Administered Date(s) Administered   Fluad Quad(high Dose 65+) 01/24/2019, 02/24/2020   Influenza Split 06/05/2011, 06/26/2012   Influenza Whole 03/14/2010   Influenza,inj,Quad PF,6+ Mos 02/09/2013, 04/16/2014, 03/11/2015, 04/19/2016, 01/26/2017   Influenza-Unspecified 05/23/2018   PFIZER(Purple Top)SARS-COV-2 Vaccination 08/22/2019, 09/15/2019, 05/12/2020   PPD Test 10/04/2011, 04/07/2015   Pneumococcal Conjugate-13 04/02/2015   Pneumococcal Polysaccharide-23 02/19/2009, 06/05/2011, 01/28/2014, 09/17/2020   Td 05/22/2004   Tdap 10/17/2014   There are no preventive care reminders to display for  this patient.     Past Medical History:  Diagnosis Date   ALLERGIC RHINITIS 06/24/2009   Allergy    Anemia    IDA   Anxiety 11/12/2011   Adequate for discharge    Arthritis    "all my joints" (09/30/2013)   Arthritis of foot, right, degenerative 04/15/2014   Balance disorder 03/12/2013   Benign neoplasm of cecum    Benign neoplasm of descending colon    CAD (coronary artery disease) 06/24/2009   5 stents placed in 2007     Chronic anticoagulation    Chronic pain syndrome 10/27/2009   of ankle, shoulders, low back.  sciatica.    Closed fracture of right foot 10/17/2014   CORONARY ARTERY DISEASE 06/24/2009   a. s/p multiple PCIs - In 2008 he had a Taxus DES to the mild LAD, Endeavor DES to mid LCX and distal LCX. In January 2009 he had DES to distal LCX, mid LCX and proximal LCX. In November 2009 had BMS x 2 to the mid RCA. Cath 10/2011 with patent stents, noncardiac CP. LHC 01/2013: patent stents (noncardiac CP).   DEGENERATIVE JOINT DISEASE 06/24/2009   Qualifier: Diagnosis of  By: [name], [name]    Depression    Depression with anxiety    Prior suicide attempt(08/25/19-pt states not suicide attempt)   [place] DISEASE, LUMBAR 04/19/2010   ERECTILE DYSFUNCTION, ORGANIC 05/30/2010   Essential hypertension  06/24/2009   Qualifier: Diagnosis of  By: [name], [name]    Fibromyalgia    Fracture dislocation of ankle joint 09/02/2015   Gait disorder 03/12/2013   General weakness 07/14/2014   GERD (gastroesophageal reflux disease) 09/08/2015   08/25/15-pt states was cardiac origin, not GERD   Hand joint pain 06/10/2013   Heart murmur    Hepatitis C    treated pt. unknown with what he was a teenager   History of kidney stones    Hyperlipidemia 07/15/2009   Qualifier: Diagnosis of  By: Aundra Dubin, MD, [place]     HYPERLIPIDEMIA-MIXED 07/15/2009   08/25/19-pt state cholesterol was normal range   HYPERTENSION 06/24/2009   Insomnia 10/04/2011   Irregular heart beat    Left hip pain 03/12/2013   Injected under  ultrasound guidance on June 24, 2013    Major depression 09/13/2015   Myocardial infarction [facility]) 2008   Non-cardiac chest pain 10/2011, 01/2013   Obesity    Occult blood positive stool 10/17/2014   Open ankle fracture 09/02/2015   OSA (obstructive sleep apnea)    not using CPAP (09/30/2013)   Pain of right thumb 04/03/2013   Pneumonia    PPD positive 04/08/2015   Pre-ulcerative corn or callous 02/06/2013   Rotator cuff tear arthropathy of both shoulders 06/10/2013   History of bilateral shoulder cuff surgery for rotator cuff tears. Reports increase in pain 09/11/2015 during physical therapy of the left shoulder.    SCIATICA, LEFT 04/19/2010   Qualifier: Diagnosis of  By: [name], [name]    Sleep apnea    wears cpap 08/25/19-States not using due to nasal stuffiness.   Spinal stenosis in cervical region 09/26/2013   Spinal stenosis, lumbar region, with neurogenic claudication 09/26/2013   Type II diabetes mellitus ([place]) 2012   no meds in 09/2014.    Umbilical hernia    Uncontrolled type 2 DM with peripheral circulatory disorder ([place]) 10/04/2013   08/25/19- pt states A1C normal, no diabetes   URETHRAL STRICTURE 06/24/2009   self catheterizes.    Past Surgical History:  Procedure Laterality Date   AMPUTATION Right 06/14/2016   Procedure: AMPUTATION BELOW KNEE;  Surgeon: Newt Minion, MD;  Location: [place];  Service: Orthopedics;  Laterality: Right;   ANKLE FUSION Right 04/15/2014   Procedure: Right Subtalar, Talonavicular Fusion;  Surgeon: Newt Minion, MD;  Location: [place];  Service: Orthopedics;  Laterality: Right;   ANKLE FUSION Right 04/18/2016   Procedure: Right Ankle Tibiocalcaneal Fusion;  Surgeon: Newt Minion, MD;  Location: [place];  Service: Orthopedics;  Laterality: Right;   ANTERIOR CERVICAL DECOMP/DISCECTOMY FUSION N/A 09/26/2013   Procedure: ANTERIOR CERVICAL DISCECTOMY FUSION C3-4, plate and screw fixation, allograft bone graft;  Surgeon: Jessy Oto, MD;  Location: [place];  Service:  Orthopedics;  Laterality: N/A;   BACK SURGERY     3   BELOW KNEE LEG AMPUTATION Right 06/14/2016   right ankle and foot   CARDIAC CATHETERIZATION  X 1   CARPAL TUNNEL RELEASE Bilateral    COLONOSCOPY N/A 10/22/2014   Procedure: COLONOSCOPY;  Surgeon: Lafayette Dragon, MD;  Location: [facility] ENDOSCOPY;  Service: Endoscopy;  Laterality: N/A;   COLONOSCOPY  11/19/2018   CORONARY ANGIOPLASTY WITH STENT PLACEMENT     "I have 9 stents"   ESOPHAGOGASTRODUODENOSCOPY N/A 10/19/2014   Procedure: ESOPHAGOGASTRODUODENOSCOPY (EGD);  Surgeon: Jerene Bears, MD;  Location: [facility] ENDOSCOPY;  Service: Endoscopy;  Laterality: N/A;   FRACTURE SURGERY  FUSION OF TALONAVICULAR JOINT Right 04/15/2014   dr duda   GASTRIC BYPASS  02/20/2019   HERNIA REPAIR     umbilical   INGUINAL HERNIA REPAIR Right 05/11/2015   Procedure: LAPAROSCOPIC REPAIR RIGHT  INGUINAL HERNIA;  Surgeon: Greer Pickerel, MD;  Location: [place];  Service: General;  Laterality: Right;   INSERTION OF MESH Right 05/11/2015   Procedure: INSERTION OF MESH;  Surgeon: Greer Pickerel, MD;  Location: [place];  Service: General;  Laterality: Right;   JOINT REPLACEMENT     KNEE CARTILAGE SURGERY Right X 12   "~ 1/2 open; ~ 1/2 scopes"   KNEE CARTILAGE SURGERY Left X 3   "3 scopes"   LAPAROSCOPIC GASTRIC SLEEVE RESECTION N/A 03/03/2019   Procedure: LAPAROSCOPIC GASTRIC SLEEVE RESECTION, Upper Endo, ERAS Pathway;  Surgeon: Greer Pickerel, MD;  Location: [facility];  Service: General;  Laterality: N/A;   LEFT HEART CATHETERIZATION WITH CORONARY ANGIOGRAM N/A 02/10/2013   Procedure: LEFT HEART CATHETERIZATION WITH CORONARY ANGIOGRAM;  Surgeon: Burnell Blanks, MD;  Location: [facility] CATH LAB;  Service: Cardiovascular;  Laterality: N/A;   LUMBAR LAMINECTOMY N/A 07/16/2018   Procedure: LEFT L4-5 REDO PARTIAL LUMBAR HEMILAMINECTOMY WITH FORAMINOTOMY LEFT L4;  Surgeon: Jessy Oto, MD;  Location: [place];  Service: Orthopedics;  Laterality: N/A;   LUMBAR LAMINECTOMY/DECOMPRESSION  MICRODISCECTOMY N/A 01/27/2014   Procedure: CENTRAL LUMBAR LAMINECTOMY L4-5 AND L3-4;  Surgeon: Jessy Oto, MD;  Location: [place];  Service: Orthopedics;  Laterality: N/A;   ORIF ANKLE FRACTURE Right 09/02/2015   Procedure: OPEN REDUCTION INTERNAL FIXATION (ORIF) ANKLE FRACTURE;  Surgeon: Newt Minion, MD;  Location: [place];  Service: Orthopedics;  Laterality: Right;   PERIPHERALLY INSERTED CENTRAL CATHETER INSERTION  09/02/2015   POLYPECTOMY     ROTATOR CUFF REPAIR Left    x 2   ROTATOR CUFF REPAIR Right    x 3   SHOULDER ARTHROSCOPY W/ ROTATOR CUFF REPAIR Bilateral    "3 on the right; 1 on the left"   SKIN SPLIT GRAFT Right 10/01/2015   Procedure: RIGHT ANKLE APPLY SKIN GRAFT SPLIT THICKNESS;  Surgeon: Newt Minion, MD;  Location: [place];  Service: Orthopedics;  Laterality: Right;   TONSILLECTOMY     TOOTH EXTRACTION     TOTAL HIP ARTHROPLASTY Right 04/17/2018   TOTAL HIP ARTHROPLASTY Right 04/17/2018   Procedure: RIGHT TOTAL HIP ARTHROPLASTY;  Surgeon: Newt Minion, MD;  Location: [place];  Service: Orthopedics;  Laterality: Right;   TOTAL HIP ARTHROPLASTY Left 06/06/2019   Procedure: LEFT TOTAL HIP ARTHROPLASTY ANTERIOR APPROACH;  Surgeon: Mcarthur Rossetti, MD;  Location: [place];  Service: Orthopedics;  Laterality: Left;   TOTAL HIP REVISION Left 05/24/2019   TOTAL KNEE ARTHROPLASTY Bilateral 8938   UMBILICAL HERNIA REPAIR     UHR   UPPER GASTROINTESTINAL ENDOSCOPY  2016   URETHRAL DILATION  X 4   VASECTOMY     WISDOM TOOTH EXTRACTION      reports that he quit smoking about 10 years ago. His smoking use included cigars. He has never used smokeless tobacco. He reports current alcohol use. He reports that he does not use drugs. family history includes Breast cancer in his mother; Colon polyps in his brother; Coronary artery disease in an other family member; Depression in his brother, mother, and another family member; Diabetes in his father; Early death in his maternal  grandfather, paternal grandfather, and son; Esophageal cancer in his mother; Healthy in his son; Heart attack (  age of onset: 26) in his son; Heart attack (age of onset: 31) in his maternal grandfather; Heart disease in his father, maternal grandfather, and mother; Hyperlipidemia in his father; Hypertension in his brother, father, mother, and another family member; Pancreatic cancer in his mother; Prostate cancer in his father; Skin cancer in his father; Stomach cancer in his mother. No Known Allergies Current Outpatient Medications on File Prior to Visit  Medication Sig Dispense Refill   albuterol (VENTOLIN HFA) 108 (90 Base) MCG/ACT inhaler Inhale 2 puffs into the lungs every 6 (six) hours as needed for wheezing or shortness of breath. 18 g 1   ARIPiprazole (ABILIFY) 10 MG tablet Take 1 tablet (10 mg total) by mouth daily. 90 tablet 0   aspirin 81 MG chewable tablet Chew 1 tablet (81 mg total) by mouth daily. 35 tablet 0   atorvastatin (LIPITOR) 20 MG tablet Take 1 tablet (20 mg total) by mouth daily. (Patient taking differently: Take 20 mg by mouth daily.) 90 tablet 0   Azelastine-Fluticasone 137-50 MCG/ACT SUSP Use both nostrils as directed twice daily 23 g 11   benztropine (COGENTIN) 0.5 MG tablet Take 1 tablet (0.5 mg total) by mouth at bedtime. 90 tablet 0   calcium carbonate (TUMS - DOSED IN MG ELEMENTAL CALCIUM) 500 MG chewable tablet Chew 1 tablet by mouth 3 (three) times daily with meals.     celecoxib (CELEBREX) 200 MG capsule Take 1 capsule (200 mg total) by mouth 2 (two) times daily. 40 capsule 0   donepezil (ARICEPT) 5 MG tablet Take 1 tablet (5 mg total) by mouth at bedtime. 90 tablet 3   DULoxetine HCl 40 MG CPEP Take 40 mg by mouth 2 (two) times daily. 180 capsule 0   gabapentin (NEURONTIN) 800 MG tablet Take 800 mg by mouth 3 (three) times daily.     hydrochlorothiazide (HYDRODIURIL) 25 MG tablet Take 1 tablet (25 mg total) by mouth daily. (Patient taking differently: Take 25 mg by  mouth daily.) 90 tablet 3   HYDROcodone-acetaminophen (NORCO) 10-325 MG tablet Take 1 tablet by mouth every 8 (eight) hours as needed.     iron polysaccharides (NU-IRON) 150 MG capsule Take 1 capsule (150 mg total) by mouth daily. (Patient taking differently: Take 300 mg by mouth daily.) 90 capsule 1   losartan (COZAAR) 50 MG tablet Take 1 tablet (50 mg total) by mouth daily. 90 tablet 3   methocarbamol (ROBAXIN) 500 MG tablet Take 1 tablet (500 mg total) by mouth every 8 (eight) hours as needed for muscle spasms. 90 tablet 1   Omega-3 Fatty Acids (FISH OIL) 1000 MG CAPS Take 1,000 mg by mouth at bedtime.      phentermine 37.5 MG capsule Take 1 capsule (37.5 mg total) by mouth every morning. (Patient not taking: Reported on 01/14/2021) 30 capsule 0   prasugrel (EFFIENT) 5 MG TABS tablet Take 1 tablet (5 mg total) by mouth daily. 90 tablet 3   sildenafil (VIAGRA) 100 MG tablet Take 100 mg by mouth daily as needed for erectile dysfunction.  (Patient not taking: Reported on 01/14/2021)     tamsulosin (FLOMAX) 0.4 MG CAPS capsule Take 0.4 mg by mouth 2 (two) times daily.     traZODone (DESYREL) 150 MG tablet Take 1 tablet (150 mg total) by mouth at bedtime. 90 tablet 0   ZENPEP 40000-126000 units CPEP Take 2 capsules (80,000 units) with meals and 1 capsule (40,000 units) with each snack (Patient taking differently: Take 1-2 capsules by mouth  See admin instructions. Take 2 capsules (80,000 units) with meals and 1 capsule (40,000 units) with each snack) 720 capsule 1   zolpidem (AMBIEN) 10 MG tablet Take 1 tablet (10 mg total) by mouth at bedtime as needed for sleep. 90 tablet 1   buprenorphine (BUTRANS) 20 MCG/HR PTWK Place 1 patch onto the skin once a week.     cholecalciferol (VITAMIN D3) 25 MCG (1000 UNIT) tablet Take 1,000 Units by mouth daily.     pantoprazole (PROTONIX) 40 MG tablet Take 40 mg by mouth daily.     No current facility-administered medications on file prior to visit.        ROS:  All  others reviewed and negative.  Objective        PE:  BP (!) 156/74 (BP Location: Left Arm, Patient Position: Sitting, Cuff Size: Large)   Pulse 65   Temp 98.8 F (37.1 C) (Oral)   Ht 6' 1"  (1.854 m)   Wt (!) 303 lb (137.4 kg)   SpO2 97%   BMI 39.98 kg/m                 Constitutional: Pt appears in NAD               HENT: Head: NCAT.                Right Ear: External ear normal.                 Left Ear: External ear normal.                Eyes: . Pupils are equal, round, and reactive to light. Conjunctivae and EOM are normal; Bilat tm's with mild erythema.  Max sinus areas non tender.  Pharynx with mild erythema, no exudate               Nose: without d/c or deformity               Neck: Neck supple. Gross normal ROM               Cardiovascular: Normal rate and regular rhythm.                 Pulmonary/Chest: Effort normal and breath sounds decreased bialterally without rales or wheezing.                Abd:  Soft, NT, ND, + BS, no organomegaly               Neurological: Pt is alert. At baseline orientation, motor grossly intact               Skin: Skin is warm. No rashes, no other new lesions, LE edema - none               Psychiatric: Pt behavior is normal without agitation   Micro: none  Cardiac tracings I have personally interpreted today:  none  Pertinent Radiological findings (summarize): none   Lab Results  Component Value Date   WBC 3.4 (L) 12/01/2020   HGB 12.0 (L) 12/01/2020   HCT 35.4 (L) 12/01/2020   PLT 173.0 12/01/2020   GLUCOSE 84 12/01/2020   CHOL 113 12/01/2020   TRIG 85.0 12/01/2020   HDL 42.30 12/01/2020   LDLCALC 54 12/01/2020   ALT 11 12/01/2020   AST 22 12/01/2020   NA 139 12/01/2020   K 4.3 12/01/2020   CL 101 12/01/2020   CREATININE 1.23 12/01/2020  BUN 21 12/01/2020   CO2 32 12/01/2020   TSH 2.52 02/24/2020   PSA 0.56 07/24/2019   INR 1.1 06/03/2019   HGBA1C 6.1 12/01/2020   MICROALBUR <0.7 07/24/2019   Assessment/Plan:   Travis MARROW Sr. is a 70 y.o. White or Caucasian [1] male with  has a past medical history of ALLERGIC RHINITIS (06/24/2009), Allergy, Anemia, Anxiety (11/12/2011), Arthritis, Arthritis of foot, right, degenerative (04/15/2014), Balance disorder (03/12/2013), Benign neoplasm of cecum, Benign neoplasm of descending colon, CAD (coronary artery disease) (06/24/2009), Chronic anticoagulation, Chronic pain syndrome (10/27/2009), Closed fracture of right foot (10/17/2014), CORONARY ARTERY DISEASE (06/24/2009), DEGENERATIVE JOINT DISEASE (06/24/2009), Depression, Depression with anxiety, DISC DISEASE, LUMBAR (04/19/2010), ERECTILE DYSFUNCTION, ORGANIC (05/30/2010), Essential hypertension (06/24/2009), Fibromyalgia, Fracture dislocation of ankle joint (09/02/2015), Gait disorder (03/12/2013), General weakness (07/14/2014), GERD (gastroesophageal reflux disease) (09/08/2015), Hand joint pain (06/10/2013), Heart murmur, Hepatitis C, History of kidney stones, Hyperlipidemia (07/15/2009), HYPERLIPIDEMIA-MIXED (07/15/2009), HYPERTENSION (06/24/2009), Insomnia (10/04/2011), Irregular heart beat, Left hip pain (03/12/2013), Major depression (09/13/2015), Myocardial infarction ([place]) (2008), Non-cardiac chest pain (10/2011, 01/2013), Obesity, Occult blood positive stool (10/17/2014), Open ankle fracture (09/02/2015), OSA (obstructive sleep apnea), Pain of right thumb (04/03/2013), Pneumonia, PPD positive (04/08/2015), Pre-ulcerative corn or callous (02/06/2013), Rotator cuff tear arthropathy of both shoulders (06/10/2013), SCIATICA, LEFT (04/19/2010), Sleep apnea, Spinal stenosis in cervical region (09/26/2013), Spinal stenosis, lumbar region, with neurogenic claudication (09/26/2013), Type II diabetes mellitus ([place]) (5102), Umbilical hernia, Uncontrolled type 2 DM with peripheral circulatory disorder ([place]) (10/04/2013), and URETHRAL STRICTURE (06/24/2009).  Dyspnea I suspect new onset asthma, will arrange for ONO at home with [facility], also for  PFTs, trial symbicort, predpac asd,  to f/u any worsening symptoms or concerns  Allergic rhinitis Ok to continue flonase, and add zyrtec daily prn,  to f/u any worsening symptoms or concerns  Essential hypertension BP Readings from Last 3 Encounters:  01/14/21 (!) 156/74  12/15/20 109/73  12/01/20 134/78   Mild uncontrolled, likely situational, pt to continue medical treatment hct, losartan, tx other issues today, also for f/u bp at home and next visit  Followup: No follow-ups on file.  [name], MD 01/16/2021 9:20 PM [facility]

## 2021-01-16 NOTE — Assessment & Plan Note (Signed)
BP Readings from Last 3 Encounters:  01/14/21 (!) 156/74  12/15/20 109/73  12/01/20 134/78   Mild uncontrolled, likely situational, pt to continue medical treatment hct, losartan, tx other issues today, also for f/u bp at home and next visit

## 2021-01-16 NOTE — Assessment & Plan Note (Signed)
I suspect new onset asthma, will arrange for ONO at home with [facility], also for PFTs, trial symbicort, predpac asd,  to f/u any worsening symptoms or concerns

## 2021-01-16 NOTE — Assessment & Plan Note (Signed)
Ok to continue flonase, and add zyrtec daily prn,  to f/u any worsening symptoms or concerns

## 2021-01-17 ENCOUNTER — Telehealth: Payer: Self-pay

## 2021-01-17 NOTE — Telephone Encounter (Signed)
T has called stating his pharmacy has informed that his medication Tetstosterone is on back order and they do not know when it will be aval. Pt states he will call the office back with an alternative to replace the medication until the original is in stock.

## 2021-01-18 ENCOUNTER — Ambulatory Visit (INDEPENDENT_AMBULATORY_CARE_PROVIDER_SITE_OTHER): Payer: Medicare Other | Admitting: Family Medicine

## 2021-01-18 MED ORDER — TESTOSTERONE 50 MG/5GM (1%) TD GEL: 5.0000 g | Freq: Every day | TRANSDERMAL | 5 refills | Status: DC

## 2021-01-18 NOTE — Addendum Note (Signed)
Addended by: [name] on: 01/18/2021 06:57 PM   Modules accepted: Orders

## 2021-01-18 NOTE — Telephone Encounter (Signed)
Ok to try androgel - done erx

## 2021-01-19 NOTE — Telephone Encounter (Signed)
Patient notified

## 2021-01-20 ENCOUNTER — Other Ambulatory Visit: Payer: Self-pay | Admitting: Gastroenterology

## 2021-01-20 ENCOUNTER — Telehealth: Payer: Self-pay | Admitting: Cardiovascular Disease

## 2021-01-20 DIAGNOSIS — E785 Hyperlipidemia, unspecified: Secondary | ICD-10-CM

## 2021-01-20 MED ORDER — ATORVASTATIN CALCIUM 20 MG PO TABS: 20.0000 mg | ORAL_TABLET | Freq: Every day | ORAL | 0 refills | Status: DC

## 2021-01-20 NOTE — Telephone Encounter (Signed)
   *  STAT* If patient is at the pharmacy, call can be transferred to refill team.   1. Which medications need to be refilled? (please list name of each medication and dose if known)   atorvastatin (LIPITOR) 20 MG tablet    2. Which pharmacy/location (including street and city if local pharmacy) is medication to be sent to? [place], [place]  3. Do they need a 30 day or 90 day supply? 90 days

## 2021-01-23 ENCOUNTER — Telehealth: Payer: Self-pay | Admitting: Internal Medicine

## 2021-01-25 NOTE — Telephone Encounter (Signed)
Patient stated at 01/14/21 visit that he is not taking this medication currently

## 2021-01-26 DIAGNOSIS — M48061 Spinal stenosis, lumbar region without neurogenic claudication: Secondary | ICD-10-CM | POA: Diagnosis not present

## 2021-01-26 DIAGNOSIS — M4326 Fusion of spine, lumbar region: Secondary | ICD-10-CM | POA: Diagnosis not present

## 2021-01-26 DIAGNOSIS — H524 Presbyopia: Secondary | ICD-10-CM | POA: Diagnosis not present

## 2021-01-26 DIAGNOSIS — M4726 Other spondylosis with radiculopathy, lumbar region: Secondary | ICD-10-CM | POA: Diagnosis not present

## 2021-01-28 ENCOUNTER — Encounter: Payer: Self-pay | Admitting: Family

## 2021-01-28 ENCOUNTER — Ambulatory Visit (INDEPENDENT_AMBULATORY_CARE_PROVIDER_SITE_OTHER): Payer: Medicare Other | Admitting: Family

## 2021-01-28 DIAGNOSIS — Z89511 Acquired absence of right leg below knee: Secondary | ICD-10-CM | POA: Diagnosis not present

## 2021-01-31 ENCOUNTER — Other Ambulatory Visit: Payer: Self-pay | Admitting: Internal Medicine

## 2021-01-31 NOTE — Telephone Encounter (Signed)
Please refill as per office routine med refill policy (all routine meds to be refilled for 3 mo or monthly (per pt preference) up to one year from last visit, then month to month grace period for 3 mo, then further med refills will have to be denied) ? ?

## 2021-02-02 ENCOUNTER — Ambulatory Visit (INDEPENDENT_AMBULATORY_CARE_PROVIDER_SITE_OTHER): Payer: Medicare Other | Admitting: Orthopedic Surgery

## 2021-02-02 ENCOUNTER — Encounter: Payer: Self-pay | Admitting: Orthopedic Surgery

## 2021-02-02 ENCOUNTER — Other Ambulatory Visit: Payer: Self-pay

## 2021-02-02 DIAGNOSIS — M19012 Primary osteoarthritis, left shoulder: Secondary | ICD-10-CM

## 2021-02-02 DIAGNOSIS — R3915 Urgency of urination: Secondary | ICD-10-CM | POA: Diagnosis not present

## 2021-02-02 NOTE — Progress Notes (Signed)
Office Visit Note   Patient: Travis RAJEWSKI Sr.           Date of Birth: 1951-03-30           MRN: [id number] Visit Date: 01/28/2021              Requested by: [name], MD [place],  [place] [address] PCP: [name], MD  No chief complaint on file.     HPI: The patient is a 70 year old gentleman who presents today for prosthesis evaluation.  He has been having issues with chronic ulcerations to his right residual limb.  He is status post right below-knee amputation.  Despite modifications ulcers continue to recur.  He has been sent for evaluation.  They would like to get him set up with a new prosthesis unfortunately his liner has been rubbing and pulling off and on with wear  Assessment & Plan: Visit Diagnoses: No diagnosis found.  Plan: Given an order today for new prosthesis set up.  He will follow-up with [place] in the office as needed.  Discussed the importance of close monitoring of the ulcer and minimizing weightbearing with his prosthesis until healed  Follow-Up Instructions: No follow-ups on file.   Ortho Exam  Patient is alert, oriented, no adenopathy, well-dressed, normal affect, normal respiratory effort. On examination of the right residual limb this is well consolidated incision is well-healed there is central ulceration from an bearing and rubbing.  This is filled in with granulation and is superficial is 8 mm in diameter there is no surrounding erythema no maceration no sign of infection  Imaging: No results found. No images are attached to the encounter.  Labs: Lab Results  Component Value Date   HGBA1C 6.1 12/01/2020   HGBA1C 5.7 (A) 02/24/2020   HGBA1C 5.9 02/24/2020   ESRSEDRATE 22 (H) 09/03/2018   ESRSEDRATE 35 (H) 08/15/2018   ESRSEDRATE 20 (H) 09/03/2013   CRP 7.3 09/03/2018   CRP 14.1 (H) 08/15/2018   CRP 10.7 (H) 09/03/2013   REPTSTATUS 08/16/2018 FINAL 08/15/2018   GRAMSTAIN  09/02/2013    WBC PRESENT,  PREDOMINANTLY MONONUCLEAR NO ORGANISMS SEEN CYTOSPIN Performed by [facility] Performed at [place]  09/02/2013    NO ORGANISMS SEEN WBC PRESENT, PREDOMINANTLY MONONUCLEAR CYTOSPIN Gram Stain Report Called to,Read Back By and Verified With: A LEDWELL RN 1931 09/02/13 A NAVARRO   CULT (A) 08/15/2018    <10,000 COLONIES/mL INSIGNIFICANT GROWTH Performed at [facility], [place] [address] [place], [place] [address]    LABORGA STAPHYLOCOCCUS SPECIES (COAGULASE NEGATIVE) 04/08/2015     Lab Results  Component Value Date   ALBUMIN 4.1 12/01/2020   ALBUMIN 3.5 07/23/2020   ALBUMIN 4.3 02/24/2020    Lab Results  Component Value Date   MG 1.5 (L) 08/15/2018   MG 1.9 05/03/2013   Lab Results  Component Value Date   VD25OH 53.07 12/01/2020   VD25OH 29.84 (L) 02/24/2020   VD25OH 24.67 (L) 07/24/2019    No results found for: PREALBUMIN CBC EXTENDED Latest Ref Rng & Units 12/01/2020 07/31/2020 07/30/2020  WBC 4.0 - 10.5 K/uL 3.4(L) 6.7 7.4  RBC 4.22 - 5.81 Mil/uL 4.22 3.45(L) 2.96(L)  HGB 13.0 - 17.0 g/dL 12.0(L) 10.7(L) 9.4(L)  HCT 39.0 - 52.0 % 35.4(L) 31.3(L) 26.3(L)  PLT 150.0 - 400.0 K/uL 173.0 247 194  NEUTROABS 1.4 - 7.7 K/uL 1.5 5.4 5.9  LYMPHSABS 0.7 - 4.0 K/uL 1.2 0.6(L)  0.6(L)     There is no height or weight on file to calculate BMI.  Orders:  No orders of the defined types were placed in this encounter.  No orders of the defined types were placed in this encounter.    Procedures: No procedures performed  Clinical Data: No additional findings.  ROS:  All other systems negative, except as noted in the HPI. Review of Systems  Objective: Vital Signs: There were no vitals taken for this visit.  Specialty Comments:  No specialty comments available.  PMFS History: Patient Active Problem List   Diagnosis Date Noted   Dyspnea 01/14/2021   Sinusitis 11/02/2020   Memory loss or impairment 10/11/2020   Right arm pain  09/18/2020   Aortic atherosclerosis ([place]) 09/16/2020   Spondylolisthesis, lumbar region    Status post lumbar spinal fusion 07/27/2020   Nasal sinus polyp 06/16/2020   Vitamin D deficiency 02/24/2020   Cough 07/29/2019   Wheezing 07/29/2019   Possible exposure to STD 07/24/2019   Unilateral primary osteoarthritis, left hip 06/06/2019   Status post total replacement of left hip 06/06/2019   S/P laparoscopic sleeve gastrectomy 03/03/2019   Rash 01/24/2019   Fever 08/15/2018   UTI (urinary tract infection) 08/15/2018   Fall at home, initial encounter 08/15/2018   Chronic anemia 08/15/2018   Hypokalemia 08/15/2018   Bowel incontinence 07/25/2018   Other spondylosis with radiculopathy, lumbar region 07/16/2018    Class: Chronic   Status post lumbar laminectomy 07/16/2018   Status post THR (total hip replacement) 04/17/2018   Unilateral primary osteoarthritis, right hip    Morbid obesity with BMI of 40.0-44.9, adult ([place]) 02/28/2018   Myocardial infarction ([place]) 02/25/2018   Coronary artery disease involving native coronary artery of native heart 02/25/2018   PAD (peripheral artery disease) ([place]) 02/25/2018   S/P BKA (below knee amputation) unilateral, right ([place]) 02/25/2018   Sacroiliitis ([place]) 02/25/2018   SOB (shortness of breath) 01/03/2018   Acute on chronic heart failure ([place]) 01/03/2018   Severe right groin pain 12/20/2017   Morbid obesity ([place]) 10/09/2017   Low back pain 07/13/2017   Leukoplakia, tongue 01/26/2017   Skin lesion 10/20/2016   S/P unilateral BKA (below knee amputation), right ([place]) 06/14/2016   Charcot foot due to diabetes mellitus ([place])    Charcot's arthropathy associated with type 2 diabetes mellitus ([place]) 04/11/2016   Encounter for well adult exam with abnormal findings 11/05/2015   Major depression 09/13/2015   S/P TKR (total knee replacement) bilaterally 09/13/2015   GERD (gastroesophageal reflux disease) 09/08/2015   S/P laparoscopic hernia repair  05/11/2015   PPD positive 04/08/2015   Benign neoplasm of descending colon    Benign neoplasm of cecum    Acute blood loss as cause of postoperative anemia    Chronic anticoagulation    Occult blood positive stool 10/17/2014   General weakness 07/14/2014   Urinary incontinence 07/14/2014   Headache(784.0) 10/15/2013   Spinal stenosis in cervical region 09/26/2013    Class: Chronic   Congenital spinal stenosis of lumbar region 09/26/2013    Class: Chronic   Hand joint pain 06/10/2013   Rotator cuff tear arthropathy of both shoulders 06/10/2013   Skin lesion of cheek 05/01/2013   Pain of right thumb 04/03/2013   Balance disorder 03/12/2013   Gait disorder 03/12/2013   Tremor 03/12/2013   Left hip pain 03/12/2013   Pre-ulcerative corn or callous 02/06/2013   Anxiety 11/12/2011   OSA on CPAP 11/07/2011   Bradycardia 10/20/2011  Insomnia 10/04/2011   Obesity 01/12/2011   Diabetes mellitus type 2 in obese ([place]) 09/27/2010   ERECTILE DYSFUNCTION, ORGANIC 05/30/2010   Degenerative disc disease, lumbar 04/19/2010   SCIATICA, LEFT 04/19/2010   Chronic pain syndrome 10/27/2009   Hyperlipidemia 07/15/2009   Essential hypertension 06/24/2009   Coronary artery disease involving native coronary artery of native heart without angina pectoris 06/24/2009   Allergic rhinitis 06/24/2009   URETHRAL STRICTURE 06/24/2009   DEGENERATIVE JOINT DISEASE 06/24/2009   SHOULDER PAIN, BILATERAL 06/24/2009   FATIGUE 06/24/2009   NEPHROLITHIASIS, HX OF 06/24/2009   Past Medical History:  Diagnosis Date   ALLERGIC RHINITIS 06/24/2009   Allergy    Anemia    IDA   Anxiety 11/12/2011   Adequate for discharge    Arthritis    "all my joints" (09/30/2013)   Arthritis of foot, right, degenerative 04/15/2014   Balance disorder 03/12/2013   Benign neoplasm of cecum    Benign neoplasm of descending colon    CAD (coronary artery disease) 06/24/2009   5 stents placed in 2007     Chronic anticoagulation     Chronic pain syndrome 10/27/2009   of ankle, shoulders, low back.  sciatica.    Closed fracture of right foot 10/17/2014   CORONARY ARTERY DISEASE 06/24/2009   a. s/p multiple PCIs - In 2008 he had a Taxus DES to the mild LAD, Endeavor DES to mid LCX and distal LCX. In January 2009 he had DES to distal LCX, mid LCX and proximal LCX. In November 2009 had BMS x 2 to the mid RCA. Cath 10/2011 with patent stents, noncardiac CP. LHC 01/2013: patent stents (noncardiac CP).   DEGENERATIVE JOINT DISEASE 06/24/2009   Qualifier: Diagnosis of  By: [name], [name]    Depression    Depression with anxiety    Prior suicide attempt(08/25/19-pt states not suicide attempt)   [place] DISEASE, LUMBAR 04/19/2010   ERECTILE DYSFUNCTION, ORGANIC 05/30/2010   Essential hypertension 06/24/2009   Qualifier: Diagnosis of  By: [name], [name]    Fibromyalgia    Fracture dislocation of ankle joint 09/02/2015   Gait disorder 03/12/2013   General weakness 07/14/2014   GERD (gastroesophageal reflux disease) 09/08/2015   08/25/15-pt states was cardiac origin, not GERD   Hand joint pain 06/10/2013   Heart murmur    Hepatitis C    treated pt. unknown with what he was a teenager   History of kidney stones    Hyperlipidemia 07/15/2009   Qualifier: Diagnosis of  By: Aundra Dubin, MD, [place]     HYPERLIPIDEMIA-MIXED 07/15/2009   08/25/19-pt state cholesterol was normal range   HYPERTENSION 06/24/2009   Insomnia 10/04/2011   Irregular heart beat    Left hip pain 03/12/2013   Injected under ultrasound guidance on June 24, 2013    Major depression 09/13/2015   Myocardial infarction [facility]) 2008   Non-cardiac chest pain 10/2011, 01/2013   Obesity    Occult blood positive stool 10/17/2014   Open ankle fracture 09/02/2015   OSA (obstructive sleep apnea)    not using CPAP (09/30/2013)   Pain of right thumb 04/03/2013   Pneumonia    PPD positive 04/08/2015   Pre-ulcerative corn or callous 02/06/2013   Rotator cuff tear arthropathy of both shoulders  06/10/2013   History of bilateral shoulder cuff surgery for rotator cuff tears. Reports increase in pain 09/11/2015 during physical therapy of the left shoulder.    SCIATICA, LEFT 04/19/2010   Qualifier: Diagnosis of  By: [name], [name]    Sleep apnea    wears cpap 08/25/19-States not using due to nasal stuffiness.   Spinal stenosis in cervical region 09/26/2013   Spinal stenosis, lumbar region, with neurogenic claudication 09/26/2013   Type II diabetes mellitus ([place]) 2012   no meds in 09/2014.    Umbilical hernia    Uncontrolled type 2 DM with peripheral circulatory disorder ([place]) 10/04/2013   08/25/19- pt states A1C normal, no diabetes   URETHRAL STRICTURE 06/24/2009   self catheterizes.     Family History  Problem Relation Age of Onset   Depression Mother    Heart disease Mother    Hypertension Mother    Breast cancer Mother        primary cancer   Stomach cancer Mother    Esophageal cancer Mother    Pancreatic cancer Mother    Diabetes Father    Heart disease Father        CABG   Hypertension Father    Hyperlipidemia Father    Prostate cancer Father    Skin cancer Father    Depression Brother        x 2   Hypertension Brother        x2   Colon polyps Brother    Heart attack Son 62   Early death Son    Heart disease Maternal Grandfather    Early death Maternal Grandfather    Heart attack Maternal Grandfather 13   Early death Paternal Grandfather    Coronary artery disease Other    Hypertension Other    Depression Other    Healthy Son    Colon cancer Neg Hx    Rectal cancer Neg Hx     Past Surgical History:  Procedure Laterality Date   AMPUTATION Right 06/14/2016   Procedure: AMPUTATION BELOW KNEE;  Surgeon: Newt Minion, MD;  Location: [facility];  Service: Orthopedics;  Laterality: Right;   ANKLE FUSION Right 04/15/2014   Procedure: Right Subtalar, Talonavicular Fusion;  Surgeon: Newt Minion, MD;  Location: [facility];  Service: Orthopedics;  Laterality: Right;   ANKLE  FUSION Right 04/18/2016   Procedure: Right Ankle Tibiocalcaneal Fusion;  Surgeon: Newt Minion, MD;  Location: [place];  Service: Orthopedics;  Laterality: Right;   ANTERIOR CERVICAL DECOMP/DISCECTOMY FUSION N/A 09/26/2013   Procedure: ANTERIOR CERVICAL DISCECTOMY FUSION C3-4, plate and screw fixation, allograft bone graft;  Surgeon: Jessy Oto, MD;  Location: [facility];  Service: Orthopedics;  Laterality: N/A;   BACK SURGERY     3   BELOW KNEE LEG AMPUTATION Right 06/14/2016   right ankle and foot   CARDIAC CATHETERIZATION  X 1   CARPAL TUNNEL RELEASE Bilateral    COLONOSCOPY N/A 10/22/2014   Procedure: COLONOSCOPY;  Surgeon: Lafayette Dragon, MD;  Location[facility] ENDOSCOPY;  Service: Endoscopy;  Laterality: N/A;   COLONOSCOPY  11/19/2018   CORONARY ANGIOPLASTY WITH STENT PLACEMENT     "I have 9 stents"   ESOPHAGOGASTRODUODENOSCOPY N/A 10/19/2014   Procedure: ESOPHAGOGASTRODUODENOSCOPY (EGD);  Surgeon: Jerene Bears, MD;  Location[facility]OSCOPY;  Service: Endoscopy;  Laterality: N/A;   FRACTURE SURGERY     FUSION OF TALONAVICULAR JOINT Right 04/15/2014   dr duda   GASTRIC BYPASS  02/20/2019   HERNIA REPAIR     umbilical   INGUINAL HERNIA REPAIR Right 05/11/2015   Procedure: LAPAROSCOPIC REPAIR RIGHT  INGUINAL HERNIA;  Surgeon: Greer Pickerel, MD;  Location: [place];  Service:  General;  Laterality: Right;   INSERTION OF MESH Right 05/11/2015   Procedure: INSERTION OF MESH;  Surgeon: Greer Pickerel, MD;  Location: [place];  Service: General;  Laterality: Right;   JOINT REPLACEMENT     KNEE CARTILAGE SURGERY Right X 12   "~ 1/2 open; ~ 1/2 scopes"   KNEE CARTILAGE SURGERY Left X 3   "3 scopes"   LAPAROSCOPIC GASTRIC SLEEVE RESECTION N/A 03/03/2019   Procedure: LAPAROSCOPIC GASTRIC SLEEVE RESECTION, Upper Endo, ERAS Pathway;  Surgeon: Greer Pickerel, MD;  Location: [facility];  Service: General;  Laterality: N/A;   LEFT HEART CATHETERIZATION WITH CORONARY ANGIOGRAM N/A 02/10/2013   Procedure: LEFT HEART  CATHETERIZATION WITH CORONARY ANGIOGRAM;  Surgeon: Burnell Blanks, MD;  Location[facility] CATH LAB;  Service: Cardiovascular;  Laterality: N/A;   LUMBAR LAMINECTOMY N/A 07/16/2018   Procedure: LEFT L4-5 REDO PARTIAL LUMBAR HEMILAMINECTOMY WITH FORAMINOTOMY LEFT L4;  Surgeon: Jessy Oto, MD;  Location: [place];  Service: Orthopedics;  Laterality: N/A;   LUMBAR LAMINECTOMY/DECOMPRESSION MICRODISCECTOMY N/A 01/27/2014   Procedure: CENTRAL LUMBAR LAMINECTOMY L4-5 AND L3-4;  Surgeon: Jessy Oto, MD;  Location: [place];  Service: Orthopedics;  Laterality: N/A;   ORIF ANKLE FRACTURE Right 09/02/2015   Procedure: OPEN REDUCTION INTERNAL FIXATION (ORIF) ANKLE FRACTURE;  Surgeon: Newt Minion, MD;  Location: [place];  Service: Orthopedics;  Laterality: Right;   PERIPHERALLY INSERTED CENTRAL CATHETER INSERTION  09/02/2015   POLYPECTOMY     ROTATOR CUFF REPAIR Left    x 2   ROTATOR CUFF REPAIR Right    x 3   SHOULDER ARTHROSCOPY W/ ROTATOR CUFF REPAIR Bilateral    "3 on the right; 1 on the left"   SKIN SPLIT GRAFT Right 10/01/2015   Procedure: RIGHT ANKLE APPLY SKIN GRAFT SPLIT THICKNESS;  Surgeon: Newt Minion, MD;  Location: [place];  Service: Orthopedics;  Laterality: Right;   TONSILLECTOMY     TOOTH EXTRACTION     TOTAL HIP ARTHROPLASTY Right 04/17/2018   TOTAL HIP ARTHROPLASTY Right 04/17/2018   Procedure: RIGHT TOTAL HIP ARTHROPLASTY;  Surgeon: Newt Minion, MD;  Location: [place];  Service: Orthopedics;  Laterality: Right;   TOTAL HIP ARTHROPLASTY Left 06/06/2019   Procedure: LEFT TOTAL HIP ARTHROPLASTY ANTERIOR APPROACH;  Surgeon: Mcarthur Rossetti, MD;  Location: [place];  Service: Orthopedics;  Laterality: Left;   TOTAL HIP REVISION Left 05/24/2019   TOTAL KNEE ARTHROPLASTY Bilateral 6045   UMBILICAL HERNIA REPAIR     UHR   UPPER GASTROINTESTINAL ENDOSCOPY  2016   URETHRAL DILATION  X 4   VASECTOMY     WISDOM TOOTH EXTRACTION     Social History   Occupational History   Occupation:  disabled since 2006 due to ortho. heart, Air traffic controller: UNEMPLOYED   Occupation: part time work as an Multimedia programmer, wrestling, and baseball coach [employer]  Tobacco Use   Smoking status: Former    Types: Cigars    Quit date: 08/28/2010    Years since quitting: 10.4   Smokeless tobacco: Never   Tobacco comments:    04/18/2016 "smoked 1 cigar/wk when I did smoke"  Vaping Use   Vaping Use: Never used  Substance and Sexual Activity   Alcohol use: Yes    Alcohol/week: 0.0 standard drinks    Comment: occasionally   Drug use: No   Sexual activity: Not Currently

## 2021-02-04 ENCOUNTER — Other Ambulatory Visit (HOSPITAL_COMMUNITY): Payer: Self-pay | Admitting: Psychiatry

## 2021-02-04 ENCOUNTER — Other Ambulatory Visit (HOSPITAL_COMMUNITY): Payer: Self-pay

## 2021-02-04 DIAGNOSIS — F319 Bipolar disorder, unspecified: Secondary | ICD-10-CM

## 2021-02-04 DIAGNOSIS — F419 Anxiety disorder, unspecified: Secondary | ICD-10-CM

## 2021-02-04 MED ORDER — DULOXETINE HCL 20 MG PO CPEP: ORAL_CAPSULE | ORAL | 2 refills | Status: DC

## 2021-02-06 NOTE — Progress Notes (Signed)
Office Visit Note   Patient: Travis TOOKER Sr.           Date of Birth: February 17, 1951           MRN: [id number] Visit Date: 02/02/2021 Requested by: [name], MD [place],  [place] [address] PCP: [name], MD  Subjective: Chief Complaint  Patient presents with   Left Shoulder - Follow-up    HPI: [name] is a 70 year old patient with severe left shoulder pain.  Since he was last seen he has had an MRI scan which does show significant supraspinatus muscle atrophy with end-stage glenohumeral joint arthritis and bulky osteophyte formation.  Small cystic lesion coming from the [facility] joint consistent with geyser cyst.  Reports pain level 8 out of 10 which is constant.  He is pain is being managed by pain management.              ROS: All systems reviewed are negative as they relate to the chief complaint within the history of present illness.  Patient denies  fevers or chills.   Assessment & Plan: Visit Diagnoses:  1. Primary osteoarthritis, left shoulder     Plan: Impression is end-stage rotator cuff arthropathy and arthritis in the left shoulder.  Plan is reverse shoulder replacement.  Risk and benefits are discussed with the patient including but not limited to infection nerve vessel damage shoulder instability as well as incomplete pain relief and potential need for revision.  Patient understands the risk benefits and wishes to proceed.  All questions answered  Follow-Up Instructions: No follow-ups on file.   Orders:  No orders of the defined types were placed in this encounter.  No orders of the defined types were placed in this encounter.     Procedures: No procedures performed   Clinical Data: No additional findings.  Objective: Vital Signs: There were no vitals taken for this visit.  Physical Exam:   Constitutional: Patient appears well-developed HEENT:  Head: Normocephalic Eyes:EOM are normal Neck: Normal range of motion Cardiovascular:  Normal rate Pulmonary/chest: Effort normal Neurologic: Patient is alert Skin: Skin is warm Psychiatric: Patient has normal mood and affect   Ortho Exam: Ortho exam demonstrates functional deltoid on the left.  Passive range of motion is limited and essentially the same as it was prior visit.  Motor or sensory function to the hand is intact.  Radial pulse intact.  Passive range of motion is painful for the patient due to the bulky nature of the osteophytes present in the shoulder.  Specialty Comments:  No specialty comments available.  Imaging: No results found.   PMFS History: Patient Active Problem List   Diagnosis Date Noted   Dyspnea 01/14/2021   Sinusitis 11/02/2020   Memory loss or impairment 10/11/2020   Right arm pain 09/18/2020   Aortic atherosclerosis ([place]) 09/16/2020   Spondylolisthesis, lumbar region    Status post lumbar spinal fusion 07/27/2020   Nasal sinus polyp 06/16/2020   Vitamin D deficiency 02/24/2020   Cough 07/29/2019   Wheezing 07/29/2019   Possible exposure to STD 07/24/2019   Unilateral primary osteoarthritis, left hip 06/06/2019   Status post total replacement of left hip 06/06/2019   S/P laparoscopic sleeve gastrectomy 03/03/2019   Rash 01/24/2019   Fever 08/15/2018   UTI (urinary tract infection) 08/15/2018   Fall at home, initial encounter 08/15/2018   Chronic anemia 08/15/2018   Hypokalemia 08/15/2018   Bowel incontinence 07/25/2018   Other spondylosis with radiculopathy, lumbar  region 07/16/2018    Class: Chronic   Status post lumbar laminectomy 07/16/2018   Status post THR (total hip replacement) 04/17/2018   Unilateral primary osteoarthritis, right hip    Morbid obesity with BMI of 40.0-44.9, adult ([place]) 02/28/2018   Myocardial infarction ([place]) 02/25/2018   Coronary artery disease involving native coronary artery of native heart 02/25/2018   PAD (peripheral artery disease) ([place]) 02/25/2018   S/P BKA (below knee amputation) unilateral,  right ([place]) 02/25/2018   Sacroiliitis ([place]) 02/25/2018   SOB (shortness of breath) 01/03/2018   Acute on chronic heart failure ([place]) 01/03/2018   Severe right groin pain 12/20/2017   Morbid obesity ([place]) 10/09/2017   Low back pain 07/13/2017   Leukoplakia, tongue 01/26/2017   Skin lesion 10/20/2016   S/P unilateral BKA (below knee amputation), right ([place]) 06/14/2016   Charcot foot due to diabetes mellitus ([place])    Charcot's arthropathy associated with type 2 diabetes mellitus ([place]) 04/11/2016   Encounter for well adult exam with abnormal findings 11/05/2015   Major depression 09/13/2015   S/P TKR (total knee replacement) bilaterally 09/13/2015   GERD (gastroesophageal reflux disease) 09/08/2015   S/P laparoscopic hernia repair 05/11/2015   PPD positive 04/08/2015   Benign neoplasm of descending colon    Benign neoplasm of cecum    Acute blood loss as cause of postoperative anemia    Chronic anticoagulation    Occult blood positive stool 10/17/2014   General weakness 07/14/2014   Urinary incontinence 07/14/2014   Headache(784.0) 10/15/2013   Spinal stenosis in cervical region 09/26/2013    Class: Chronic   Congenital spinal stenosis of lumbar region 09/26/2013    Class: Chronic   Hand joint pain 06/10/2013   Rotator cuff tear arthropathy of both shoulders 06/10/2013   Skin lesion of cheek 05/01/2013   Pain of right thumb 04/03/2013   Balance disorder 03/12/2013   Gait disorder 03/12/2013   Tremor 03/12/2013   Left hip pain 03/12/2013   Pre-ulcerative corn or callous 02/06/2013   Anxiety 11/12/2011   OSA on CPAP 11/07/2011   Bradycardia 10/20/2011   Insomnia 10/04/2011   Obesity 01/12/2011   Diabetes mellitus type 2 in obese ([place]) 09/27/2010   ERECTILE DYSFUNCTION, ORGANIC 05/30/2010   Degenerative disc disease, lumbar 04/19/2010   SCIATICA, LEFT 04/19/2010   Chronic pain syndrome 10/27/2009   Hyperlipidemia 07/15/2009   Essential hypertension 06/24/2009   Coronary  artery disease involving native coronary artery of native heart without angina pectoris 06/24/2009   Allergic rhinitis 06/24/2009   URETHRAL STRICTURE 06/24/2009   DEGENERATIVE JOINT DISEASE 06/24/2009   SHOULDER PAIN, BILATERAL 06/24/2009   FATIGUE 06/24/2009   NEPHROLITHIASIS, HX OF 06/24/2009   Past Medical History:  Diagnosis Date   ALLERGIC RHINITIS 06/24/2009   Allergy    Anemia    IDA   Anxiety 11/12/2011   Adequate for discharge    Arthritis    "all my joints" (09/30/2013)   Arthritis of foot, right, degenerative 04/15/2014   Balance disorder 03/12/2013   Benign neoplasm of cecum    Benign neoplasm of descending colon    CAD (coronary artery disease) 06/24/2009   5 stents placed in 2007     Chronic anticoagulation    Chronic pain syndrome 10/27/2009   of ankle, shoulders, low back.  sciatica.    Closed fracture of right foot 10/17/2014   CORONARY ARTERY DISEASE 06/24/2009   a. s/p multiple PCIs - In 2008 he had a Taxus DES to the mild LAD, Endeavor  DES to mid LCX and distal LCX. In January 2009 he had DES to distal LCX, mid LCX and proximal LCX. In November 2009 had BMS x 2 to the mid RCA. Cath 10/2011 with patent stents, noncardiac CP. LHC 01/2013: patent stents (noncardiac CP).   DEGENERATIVE JOINT DISEASE 06/24/2009   Qualifier: Diagnosis of  By: [name], [name]    Depression    Depression with anxiety    Prior suicide attempt(08/25/19-pt states not suicide attempt)   Eva DISEASE, LUMBAR 04/19/2010   ERECTILE DYSFUNCTION, ORGANIC 05/30/2010   Essential hypertension 06/24/2009   Qualifier: Diagnosis of  By: [name], [name]    Fibromyalgia    Fracture dislocation of ankle joint 09/02/2015   Gait disorder 03/12/2013   General weakness 07/14/2014   GERD (gastroesophageal reflux disease) 09/08/2015   08/25/15-pt states was cardiac origin, not GERD   Hand joint pain 06/10/2013   Heart murmur    Hepatitis C    treated pt. unknown with what he was a teenager   History of kidney stones     Hyperlipidemia 07/15/2009   Qualifier: Diagnosis of  By: Aundra Dubin, MD, [place]     HYPERLIPIDEMIA-MIXED 07/15/2009   08/25/19-pt state cholesterol was normal range   HYPERTENSION 06/24/2009   Insomnia 10/04/2011   Irregular heart beat    Left hip pain 03/12/2013   Injected under ultrasound guidance on June 24, 2013    Major depression 09/13/2015   Myocardial infarction [facility]) 2008   Non-cardiac chest pain 10/2011, 01/2013   Obesity    Occult blood positive stool 10/17/2014   Open ankle fracture 09/02/2015   OSA (obstructive sleep apnea)    not using CPAP (09/30/2013)   Pain of right thumb 04/03/2013   Pneumonia    PPD positive 04/08/2015   Pre-ulcerative corn or callous 02/06/2013   Rotator cuff tear arthropathy of both shoulders 06/10/2013   History of bilateral shoulder cuff surgery for rotator cuff tears. Reports increase in pain 09/11/2015 during physical therapy of the left shoulder.    SCIATICA, LEFT 04/19/2010   Qualifier: Diagnosis of  By: [name], [name]    Sleep apnea    wears cpap 08/25/19-States not using due to nasal stuffiness.   Spinal stenosis in cervical region 09/26/2013   Spinal stenosis, lumbar region, with neurogenic claudication 09/26/2013   Type II diabetes mellitus ([place]) 2012   no meds in 09/2014.    Umbilical hernia    Uncontrolled type 2 DM with peripheral circulatory disorder ([place]) 10/04/2013   08/25/19- pt states A1C normal, no diabetes   URETHRAL STRICTURE 06/24/2009   self catheterizes.     Family History  Problem Relation Age of Onset   Depression Mother    Heart disease Mother    Hypertension Mother    Breast cancer Mother        primary cancer   Stomach cancer Mother    Esophageal cancer Mother    Pancreatic cancer Mother    Diabetes Father    Heart disease Father        CABG   Hypertension Father    Hyperlipidemia Father    Prostate cancer Father    Skin cancer Father    Depression Brother        x 2   Hypertension Brother        x2   Colon polyps  Brother    Heart attack Son 72   Early death Son    Heart disease Maternal Grandfather  Early death Maternal Grandfather    Heart attack Maternal Grandfather 74   Early death Paternal Grandfather    Coronary artery disease Other    Hypertension Other    Depression Other    Healthy Son    Colon cancer Neg Hx    Rectal cancer Neg Hx     Past Surgical History:  Procedure Laterality Date   AMPUTATION Right 06/14/2016   Procedure: AMPUTATION BELOW KNEE;  Surgeon: Newt Minion, MD;  Location: [place];  Service: Orthopedics;  Laterality: Right;   ANKLE FUSION Right 04/15/2014   Procedure: Right Subtalar, Talonavicular Fusion;  Surgeon: Newt Minion, MD;  Location: [place];  Service: Orthopedics;  Laterality: Right;   ANKLE FUSION Right 04/18/2016   Procedure: Right Ankle Tibiocalcaneal Fusion;  Surgeon: Newt Minion, MD;  Location: [place];  Service: Orthopedics;  Laterality: Right;   ANTERIOR CERVICAL DECOMP/DISCECTOMY FUSION N/A 09/26/2013   Procedure: ANTERIOR CERVICAL DISCECTOMY FUSION C3-4, plate and screw fixation, allograft bone graft;  Surgeon: Jessy Oto, MD;  Location: [place];  Service: Orthopedics;  Laterality: N/A;   BACK SURGERY     3   BELOW KNEE LEG AMPUTATION Right 06/14/2016   right ankle and foot   CARDIAC CATHETERIZATION  X 1   CARPAL TUNNEL RELEASE Bilateral    COLONOSCOPY N/A 10/22/2014   Procedure: COLONOSCOPY;  Surgeon: Lafayette Dragon, MD;  Location: [facility] ENDOSCOPY;  Service: Endoscopy;  Laterality: N/A;   COLONOSCOPY  11/19/2018   CORONARY ANGIOPLASTY WITH STENT PLACEMENT     "I have 9 stents"   ESOPHAGOGASTRODUODENOSCOPY N/A 10/19/2014   Procedure: ESOPHAGOGASTRODUODENOSCOPY (EGD);  Surgeon: Jerene Bears, MD;  Location: [facility] ENDOSCOPY;  Service: Endoscopy;  Laterality: N/A;   FRACTURE SURGERY     FUSION OF TALONAVICULAR JOINT Right 04/15/2014   dr duda   GASTRIC BYPASS  02/20/2019   HERNIA REPAIR     umbilical   INGUINAL HERNIA REPAIR Right 05/11/2015   Procedure:  LAPAROSCOPIC REPAIR RIGHT  INGUINAL HERNIA;  Surgeon: Greer Pickerel, MD;  Location: [place];  Service: General;  Laterality: Right;   INSERTION OF MESH Right 05/11/2015   Procedure: INSERTION OF MESH;  Surgeon: Greer Pickerel, MD;  Location: [place];  Service: General;  Laterality: Right;   JOINT REPLACEMENT     KNEE CARTILAGE SURGERY Right X 12   "~ 1/2 open; ~ 1/2 scopes"   KNEE CARTILAGE SURGERY Left X 3   "3 scopes"   LAPAROSCOPIC GASTRIC SLEEVE RESECTION N/A 03/03/2019   Procedure: LAPAROSCOPIC GASTRIC SLEEVE RESECTION, Upper Endo, ERAS Pathway;  Surgeon: Greer Pickerel, MD;  Location: [facility];  Service: General;  Laterality: N/A;   LEFT HEART CATHETERIZATION WITH CORONARY ANGIOGRAM N/A 02/10/2013   Procedure: LEFT HEART CATHETERIZATION WITH CORONARY ANGIOGRAM;  Surgeon: Burnell Blanks, MD;  Location: [facility] CATH LAB;  Service: Cardiovascular;  Laterality: N/A;   LUMBAR LAMINECTOMY N/A 07/16/2018   Procedure: LEFT L4-5 REDO PARTIAL LUMBAR HEMILAMINECTOMY WITH FORAMINOTOMY LEFT L4;  Surgeon: Jessy Oto, MD;  Location: [place];  Service: Orthopedics;  Laterality: N/A;   LUMBAR LAMINECTOMY/DECOMPRESSION MICRODISCECTOMY N/A 01/27/2014   Procedure: CENTRAL LUMBAR LAMINECTOMY L4-5 AND L3-4;  Surgeon: Jessy Oto, MD;  Location: [place];  Service: Orthopedics;  Laterality: N/A;   ORIF ANKLE FRACTURE Right 09/02/2015   Procedure: OPEN REDUCTION INTERNAL FIXATION (ORIF) ANKLE FRACTURE;  Surgeon: Newt Minion, MD;  Location: [place];  Service: Orthopedics;  Laterality: Right;   PERIPHERALLY INSERTED CENTRAL CATHETER  INSERTION  09/02/2015   POLYPECTOMY     ROTATOR CUFF REPAIR Left    x 2   ROTATOR CUFF REPAIR Right    x 3   SHOULDER ARTHROSCOPY W/ ROTATOR CUFF REPAIR Bilateral    "3 on the right; 1 on the left"   SKIN SPLIT GRAFT Right 10/01/2015   Procedure: RIGHT ANKLE APPLY SKIN GRAFT SPLIT THICKNESS;  Surgeon: Newt Minion, MD;  Location: [place];  Service: Orthopedics;  Laterality: Right;    TONSILLECTOMY     TOOTH EXTRACTION     TOTAL HIP ARTHROPLASTY Right 04/17/2018   TOTAL HIP ARTHROPLASTY Right 04/17/2018   Procedure: RIGHT TOTAL HIP ARTHROPLASTY;  Surgeon: Newt Minion, MD;  Location: [place];  Service: Orthopedics;  Laterality: Right;   TOTAL HIP ARTHROPLASTY Left 06/06/2019   Procedure: LEFT TOTAL HIP ARTHROPLASTY ANTERIOR APPROACH;  Surgeon: Mcarthur Rossetti, MD;  Location: [place];  Service: Orthopedics;  Laterality: Left;   TOTAL HIP REVISION Left 05/24/2019   TOTAL KNEE ARTHROPLASTY Bilateral [id number]   UMBILICAL HERNIA REPAIR     UHR   UPPER GASTROINTESTINAL ENDOSCOPY  2016   URETHRAL DILATION  X 4   VASECTOMY     WISDOM TOOTH EXTRACTION     Social History   Occupational History   Occupation: disabled since 2006 due to ortho. heart, Air traffic controller: UNEMPLOYED   Occupation: part time work as an Multimedia programmer, wrestling, and baseball coach [employer]  Tobacco Use   Smoking status: Former    Types: Cigars    Quit date: 08/28/2010    Years since quitting: 10.4   Smokeless tobacco: Never   Tobacco comments:    04/18/2016 "smoked 1 cigar/wk when I did smoke"  Vaping Use   Vaping Use: Never used  Substance and Sexual Activity   Alcohol use: Yes    Alcohol/week: 0.0 standard drinks    Comment: occasionally   Drug use: No   Sexual activity: Not Currently

## 2021-02-15 ENCOUNTER — Telehealth (HOSPITAL_COMMUNITY): Payer: Self-pay | Admitting: *Deleted

## 2021-02-15 NOTE — Telephone Encounter (Signed)
PA for Cymbalta 20 mg (40 mg total) bid denied by insurance via CoverMyMeds. PA Case/Reference # is [id number].

## 2021-02-17 ENCOUNTER — Other Ambulatory Visit (INDEPENDENT_AMBULATORY_CARE_PROVIDER_SITE_OTHER): Payer: Self-pay | Admitting: Specialist

## 2021-02-17 DIAGNOSIS — M47816 Spondylosis without myelopathy or radiculopathy, lumbar region: Secondary | ICD-10-CM | POA: Diagnosis not present

## 2021-02-21 ENCOUNTER — Other Ambulatory Visit (HOSPITAL_COMMUNITY): Payer: Self-pay | Admitting: *Deleted

## 2021-02-21 MED ORDER — DULOXETINE HCL 40 MG PO CPEP: ORAL_CAPSULE | ORAL | 0 refills | Status: DC

## 2021-02-21 NOTE — Telephone Encounter (Signed)
Can we try Cymbalta 40 mg a day?

## 2021-02-21 NOTE — Telephone Encounter (Signed)
Will do!

## 2021-02-25 ENCOUNTER — Telehealth (INDEPENDENT_AMBULATORY_CARE_PROVIDER_SITE_OTHER): Payer: Medicare Other | Admitting: Internal Medicine

## 2021-02-25 DIAGNOSIS — F5104 Psychophysiologic insomnia: Secondary | ICD-10-CM

## 2021-02-25 DIAGNOSIS — E1169 Type 2 diabetes mellitus with other specified complication: Secondary | ICD-10-CM | POA: Diagnosis not present

## 2021-02-25 DIAGNOSIS — Q7649 Other congenital malformations of spine, not associated with scoliosis: Secondary | ICD-10-CM | POA: Diagnosis not present

## 2021-02-25 DIAGNOSIS — M4326 Fusion of spine, lumbar region: Secondary | ICD-10-CM | POA: Diagnosis not present

## 2021-02-25 DIAGNOSIS — J9611 Chronic respiratory failure with hypoxia: Secondary | ICD-10-CM | POA: Diagnosis not present

## 2021-02-25 DIAGNOSIS — E669 Obesity, unspecified: Secondary | ICD-10-CM

## 2021-02-25 DIAGNOSIS — M48061 Spinal stenosis, lumbar region without neurogenic claudication: Secondary | ICD-10-CM | POA: Diagnosis not present

## 2021-02-25 DIAGNOSIS — M4726 Other spondylosis with radiculopathy, lumbar region: Secondary | ICD-10-CM | POA: Diagnosis not present

## 2021-02-25 MED ORDER — ZOLPIDEM TARTRATE 10 MG PO TABS: 20.0000 mg | ORAL_TABLET | Freq: Every evening | ORAL | 1 refills | Status: DC | PRN

## 2021-02-25 NOTE — Progress Notes (Signed)
Patient ID: Travis Dyer., male   DOB: [birth year], 70 y.o.   MRN: [id number]  Virtual Visit via Video Note  I connected with Travis Novak Sr. on Feb 25 2021 at  9:40 AM EDT by a video enabled telemedicine application and verified that I am speaking with the correct person using two identifiers.  Location of all participants today Patient: at home Provider: at office   I discussed the limitations of evaluation and management by telemedicine and the availability of in person appointments. The patient expressed understanding and agreed to proceed.  History of Present Illness: Here to f/u, has not been able to get [facility] to deliver home o2 qhs, so needs change in provider.  Aso has worsening insomnia, just not able to stay alseep past about 4 hrs each night for several months.  Denies worsening depressive symptoms, suicidal ideation, or panic; has ongoing anxiety.   Pt denies polydipsia, polyuria, or new focal neuro s/s.  Pt continues to have recurring LBP without change in severity, bowel or bladder change, fever, wt loss,  worsening LE pain/numbness/weakness, gait change or falls, and has referred himself to [facility] ortho, in process of getting paper records from previous ortho over there, as last ortho retired. Asks for repeat nutrition counseling as was not able to finish the previous referral.   Past Medical History:  Diagnosis Date   ALLERGIC RHINITIS 06/24/2009   Allergy    Anemia    IDA   Anxiety 11/12/2011   Adequate for discharge    Arthritis    "all my joints" (09/30/2013)   Arthritis of foot, right, degenerative 04/15/2014   Balance disorder 03/12/2013   Benign neoplasm of cecum    Benign neoplasm of descending colon    CAD (coronary artery disease) 06/24/2009   5 stents placed in 2007     Chronic anticoagulation    Chronic pain syndrome 10/27/2009   of ankle, shoulders, low back.  sciatica.    Closed fracture of right foot 10/17/2014   CORONARY ARTERY DISEASE 06/24/2009    a. s/p multiple PCIs - In 2008 he had a Taxus DES to the mild LAD, Endeavor DES to mid LCX and distal LCX. In January 2009 he had DES to distal LCX, mid LCX and proximal LCX. In November 2009 had BMS x 2 to the mid RCA. Cath 10/2011 with patent stents, noncardiac CP. LHC 01/2013: patent stents (noncardiac CP).   DEGENERATIVE JOINT DISEASE 06/24/2009   Qualifier: Diagnosis of  By: [name], [name]    Depression    Depression with anxiety    Prior suicide attempt(08/25/19-pt states not suicide attempt)   [place] DISEASE, LUMBAR 04/19/2010   ERECTILE DYSFUNCTION, ORGANIC 05/30/2010   Essential hypertension 06/24/2009   Qualifier: Diagnosis of  By: [name], [name]    Fibromyalgia    Fracture dislocation of ankle joint 09/02/2015   Gait disorder 03/12/2013   General weakness 07/14/2014   GERD (gastroesophageal reflux disease) 09/08/2015   08/25/15-pt states was cardiac origin, not GERD   Hand joint pain 06/10/2013   Heart murmur    Hepatitis C    treated pt. unknown with what he was a teenager   History of kidney stones    Hyperlipidemia 07/15/2009   Qualifier: Diagnosis of  By: Aundra Dubin, MD, [place]     HYPERLIPIDEMIA-MIXED 07/15/2009   08/25/19-pt state cholesterol was normal range   HYPERTENSION 06/24/2009   Insomnia 10/04/2011   Irregular heart beat    Left hip  pain 03/12/2013   Injected under ultrasound guidance on June 24, 2013    Major depression 09/13/2015   Myocardial infarction [facility]) 2008   Non-cardiac chest pain 10/2011, 01/2013   Obesity    Occult blood positive stool 10/17/2014   Open ankle fracture 09/02/2015   OSA (obstructive sleep apnea)    not using CPAP (09/30/2013)   Pain of right thumb 04/03/2013   Pneumonia    PPD positive 04/08/2015   Pre-ulcerative corn or callous 02/06/2013   Rotator cuff tear arthropathy of both shoulders 06/10/2013   History of bilateral shoulder cuff surgery for rotator cuff tears. Reports increase in pain 09/11/2015 during physical therapy of the left shoulder.     SCIATICA, LEFT 04/19/2010   Qualifier: Diagnosis of  By: [name], [name]    Sleep apnea    wears cpap 08/25/19-States not using due to nasal stuffiness.   Spinal stenosis in cervical region 09/26/2013   Spinal stenosis, lumbar region, with neurogenic claudication 09/26/2013   Type II diabetes mellitus ([place]) 2012   no meds in 09/2014.    Umbilical hernia    Uncontrolled type 2 DM with peripheral circulatory disorder ([place]) 10/04/2013   08/25/19- pt states A1C normal, no diabetes   URETHRAL STRICTURE 06/24/2009   self catheterizes.    Past Surgical History:  Procedure Laterality Date   AMPUTATION Right 06/14/2016   Procedure: AMPUTATION BELOW KNEE;  Surgeon: Newt Minion, MD;  Location: [place];  Service: Orthopedics;  Laterality: Right;   ANKLE FUSION Right 04/15/2014   Procedure: Right Subtalar, Talonavicular Fusion;  Surgeon: Newt Minion, MD;  Location: [place];  Service: Orthopedics;  Laterality: Right;   ANKLE FUSION Right 04/18/2016   Procedure: Right Ankle Tibiocalcaneal Fusion;  Surgeon: Newt Minion, MD;  Location: [place];  Service: Orthopedics;  Laterality: Right;   ANTERIOR CERVICAL DECOMP/DISCECTOMY FUSION N/A 09/26/2013   Procedure: ANTERIOR CERVICAL DISCECTOMY FUSION C3-4, plate and screw fixation, allograft bone graft;  Surgeon: Jessy Oto, MD;  Location: [place];  Service: Orthopedics;  Laterality: N/A;   BACK SURGERY     3   BELOW KNEE LEG AMPUTATION Right 06/14/2016   right ankle and foot   CARDIAC CATHETERIZATION  X 1   CARPAL TUNNEL RELEASE Bilateral    COLONOSCOPY N/A 10/22/2014   Procedure: COLONOSCOPY;  Surgeon: Lafayette Dragon, MD;  Location[facility];  Service: Endoscopy;  Laterality: N/A;   COLONOSCOPY  11/19/2018   CORONARY ANGIOPLASTY WITH STENT PLACEMENT     "I have 9 stents"   ESOPHAGOGASTRODUODENOSCOPY N/A 10/19/2014   Procedure: ESOPHAGOGASTRODUODENOSCOPY (EGD);  Surgeon: Jerene Bears, MD;  Location: [facility] ENDOSCOPY;  Service: Endoscopy;  Laterality: N/A;   FRACTURE  SURGERY     FUSION OF TALONAVICULAR JOINT Right 04/15/2014   dr duda   GASTRIC BYPASS  02/20/2019   HERNIA REPAIR     umbilical   INGUINAL HERNIA REPAIR Right 05/11/2015   Procedure: LAPAROSCOPIC REPAIR RIGHT  INGUINAL HERNIA;  Surgeon: Greer Pickerel, MD;  Location: [place];  Service: General;  Laterality: Right;   INSERTION OF MESH Right 05/11/2015   Procedure: INSERTION OF MESH;  Surgeon: Greer Pickerel, MD;  Location: [place];  Service: General;  Laterality: Right;   JOINT REPLACEMENT     KNEE CARTILAGE SURGERY Right X 12   "~ 1/2 open; ~ 1/2 scopes"   KNEE CARTILAGE SURGERY Left X 3   "3 scopes"   LAPAROSCOPIC GASTRIC SLEEVE RESECTION N/A 03/03/2019  Procedure: LAPAROSCOPIC GASTRIC SLEEVE RESECTION, Upper Endo, ERAS Pathway;  Surgeon: Greer Pickerel, MD;  Location: [facility];  Service: General;  Laterality: N/A;   LEFT HEART CATHETERIZATION WITH CORONARY ANGIOGRAM N/A 02/10/2013   Procedure: LEFT HEART CATHETERIZATION WITH CORONARY ANGIOGRAM;  Surgeon: Burnell Blanks, MD;  Location: [facility] CATH LAB;  Service: Cardiovascular;  Laterality: N/A;   LUMBAR LAMINECTOMY N/A 07/16/2018   Procedure: LEFT L4-5 REDO PARTIAL LUMBAR HEMILAMINECTOMY WITH FORAMINOTOMY LEFT L4;  Surgeon: Jessy Oto, MD;  Location: [place];  Service: Orthopedics;  Laterality: N/A;   LUMBAR LAMINECTOMY/DECOMPRESSION MICRODISCECTOMY N/A 01/27/2014   Procedure: CENTRAL LUMBAR LAMINECTOMY L4-5 AND L3-4;  Surgeon: Jessy Oto, MD;  Location: [place];  Service: Orthopedics;  Laterality: N/A;   ORIF ANKLE FRACTURE Right 09/02/2015   Procedure: OPEN REDUCTION INTERNAL FIXATION (ORIF) ANKLE FRACTURE;  Surgeon: Newt Minion, MD;  Location: [place];  Service: Orthopedics;  Laterality: Right;   PERIPHERALLY INSERTED CENTRAL CATHETER INSERTION  09/02/2015   POLYPECTOMY     ROTATOR CUFF REPAIR Left    x 2   ROTATOR CUFF REPAIR Right    x 3   SHOULDER ARTHROSCOPY W/ ROTATOR CUFF REPAIR Bilateral    "3 on the right; 1 on the left"   SKIN  SPLIT GRAFT Right 10/01/2015   Procedure: RIGHT ANKLE APPLY SKIN GRAFT SPLIT THICKNESS;  Surgeon: Newt Minion, MD;  Location: [place];  Service: Orthopedics;  Laterality: Right;   TONSILLECTOMY     TOOTH EXTRACTION     TOTAL HIP ARTHROPLASTY Right 04/17/2018   TOTAL HIP ARTHROPLASTY Right 04/17/2018   Procedure: RIGHT TOTAL HIP ARTHROPLASTY;  Surgeon: Newt Minion, MD;  Location: [place];  Service: Orthopedics;  Laterality: Right;   TOTAL HIP ARTHROPLASTY Left 06/06/2019   Procedure: LEFT TOTAL HIP ARTHROPLASTY ANTERIOR APPROACH;  Surgeon: Mcarthur Rossetti, MD;  Location: [place];  Service: Orthopedics;  Laterality: Left;   TOTAL HIP REVISION Left 05/24/2019   TOTAL KNEE ARTHROPLASTY Bilateral 9390   UMBILICAL HERNIA REPAIR     UHR   UPPER GASTROINTESTINAL ENDOSCOPY  2016   URETHRAL DILATION  X 4   VASECTOMY     WISDOM TOOTH EXTRACTION      reports that he quit smoking about 10 years ago. His smoking use included cigars. He has never used smokeless tobacco. He reports current alcohol use. He reports that he does not use drugs. family history includes Breast cancer in his mother; Colon polyps in his brother; Coronary artery disease in an other family member; Depression in his brother, mother, and another family member; Diabetes in his father; Early death in his maternal grandfather, paternal grandfather, and son; Esophageal cancer in his mother; Healthy in his son; Heart attack (age of onset: 19) in his son; Heart attack (age of onset: 23) in his maternal grandfather; Heart disease in his father, maternal grandfather, and mother; Hyperlipidemia in his father; Hypertension in his brother, father, mother, and another family member; Pancreatic cancer in his mother; Prostate cancer in his father; Skin cancer in his father; Stomach cancer in his mother. No Known Allergies Current Outpatient Medications on File Prior to Visit  Medication Sig Dispense Refill   albuterol (VENTOLIN HFA) 108 (90 Base)  MCG/ACT inhaler Inhale 2 puffs into the lungs every 6 (six) hours as needed for wheezing or shortness of breath. 18 g 1   ARIPiprazole (ABILIFY) 10 MG tablet Take 1 tablet (10 mg total) by mouth daily. 90 tablet 0  aspirin 81 MG chewable tablet Chew 1 tablet (81 mg total) by mouth daily. 35 tablet 0   atorvastatin (LIPITOR) 20 MG tablet Take 1 tablet (20 mg total) by mouth daily. 90 tablet 0   Azelastine-Fluticasone 137-50 MCG/ACT SUSP Use both nostrils as directed twice daily 23 g 11   benztropine (COGENTIN) 0.5 MG tablet Take 1 tablet (0.5 mg total) by mouth at bedtime. 90 tablet 0   budesonide-formoterol (SYMBICORT) 160-4.5 MCG/ACT inhaler Inhale 2 puffs into the lungs 2 (two) times daily. 3 each 3   buprenorphine (BUTRANS) 20 MCG/HR PTWK Place 1 patch onto the skin once a week.     calcium carbonate (TUMS - DOSED IN MG ELEMENTAL CALCIUM) 500 MG chewable tablet Chew 1 tablet by mouth 3 (three) times daily with meals.     celecoxib (CELEBREX) 200 MG capsule Take 1 capsule (200 mg total) by mouth 2 (two) times daily. 40 capsule 0   cetirizine (ZYRTEC) 10 MG tablet Take 1 tablet (10 mg total) by mouth daily. 30 tablet 11   cholecalciferol (VITAMIN D3) 25 MCG (1000 UNIT) tablet Take 1,000 Units by mouth daily.     diclofenac Sodium (VOLTAREN) 1 % GEL Apply 4 g topically 3 (three) times daily as needed (pain). 350 g 5   donepezil (ARICEPT) 5 MG tablet Take 1 tablet (5 mg total) by mouth at bedtime. 90 tablet 3   DULoxetine 40 MG CPEP Take 1 capsule (66m total) by mouth daily. 45 capsule 0   gabapentin (NEURONTIN) 800 MG tablet Take 800 mg by mouth 3 (three) times daily.     hydrochlorothiazide (HYDRODIURIL) 25 MG tablet Take 1 tablet (25 mg total) by mouth daily. (Patient taking differently: Take 25 mg by mouth daily.) 90 tablet 3   HYDROcodone-acetaminophen (NORCO) 10-325 MG tablet Take 1 tablet by mouth every 8 (eight) hours as needed.     iron polysaccharides (NU-IRON) 150 MG capsule Take 1  capsule (150 mg total) by mouth daily. (Patient taking differently: Take 300 mg by mouth daily.) 90 capsule 1   losartan (COZAAR) 50 MG tablet Take 1 tablet (50 mg total) by mouth daily. 90 tablet 3   methocarbamol (ROBAXIN) 500 MG tablet Take 1 tablet (500 mg total) by mouth every 8 (eight) hours as needed for muscle spasms. 90 tablet 1   Omega-3 Fatty Acids (FISH OIL) 1000 MG CAPS Take 1,000 mg by mouth at bedtime.      Pancrelipase, Lip-Prot-Amyl, (ZENPEP) 40000-126000 units CPEP Take 2 capsules (80,000 units) with meals and 1 capsule (40,000 units) with each snack. PLEASE SCHEDULE A YEARLY FOLLOW UP FOR FURTHER REFILLS. Thank you 240 capsule 1   pantoprazole (PROTONIX) 40 MG tablet Take 40 mg by mouth daily.     phentermine 37.5 MG capsule Take 1 capsule (37.5 mg total) by mouth every morning. (Patient not taking: Reported on 01/14/2021) 30 capsule 0   prasugrel (EFFIENT) 5 MG TABS tablet Take 1 tablet (5 mg total) by mouth daily. 90 tablet 3   predniSONE (DELTASONE) 10 MG tablet 3 tabs by mouth per day for 3 days,2tabs per day for 3 days,1tab per day for 3 days 18 tablet 0   sildenafil (VIAGRA) 100 MG tablet Take 100 mg by mouth daily as needed for erectile dysfunction.  (Patient not taking: Reported on 01/14/2021)     tamsulosin (FLOMAX) 0.4 MG CAPS capsule Take 0.4 mg by mouth 2 (two) times daily.     testosterone (ANDROGEL) 50 MG/5GM (1%) GEL Place 5  g onto the skin daily. 150 g 5   Testosterone 1.62 % GEL Place 1 Pump onto the skin daily. 88 g 1   traZODone (DESYREL) 150 MG tablet Take 1 tablet (150 mg total) by mouth at bedtime. 90 tablet 0   No current facility-administered medications on file prior to visit.    Observations/Objective: Alert, NAD, appropriate mood and affect, resps normal, cn 2-12 intact, moves all 4s, no visible rash or swelling Lab Results  Component Value Date   WBC 3.4 (L) 12/01/2020   HGB 12.0 (L) 12/01/2020   HCT 35.4 (L) 12/01/2020   PLT 173.0 12/01/2020    GLUCOSE 84 12/01/2020   CHOL 113 12/01/2020   TRIG 85.0 12/01/2020   HDL 42.30 12/01/2020   LDLCALC 54 12/01/2020   ALT 11 12/01/2020   AST 22 12/01/2020   NA 139 12/01/2020   K 4.3 12/01/2020   CL 101 12/01/2020   CREATININE 1.23 12/01/2020   BUN 21 12/01/2020   CO2 32 12/01/2020   TSH 2.52 02/24/2020   PSA 0.56 07/24/2019   INR 1.1 06/03/2019   HGBA1C 6.1 12/01/2020   MICROALBUR <0.7 07/24/2019   Assessment and Plan: See notes  Follow Up Instructions: See notes   I discussed the assessment and treatment plan with the patient. The patient was provided an opportunity to ask questions and all were answered. The patient agreed with the plan and demonstrated an understanding of the instructions.   The patient was advised to call back or seek an in-person evaluation if the symptoms worsen or if the condition fails to improve as anticipated.  [name], MD

## 2021-02-25 NOTE — Patient Instructions (Signed)
We will send the rx for home o2 2L night only to [facility] to try increased ambien to 20 mg at bedtime  You will be contacted regarding the referral for:  diabetes nutrition education

## 2021-02-26 ENCOUNTER — Encounter: Payer: Self-pay | Admitting: Internal Medicine

## 2021-02-26 DIAGNOSIS — J9611 Chronic respiratory failure with hypoxia: Secondary | ICD-10-CM | POA: Insufficient documentation

## 2021-02-26 NOTE — Assessment & Plan Note (Signed)
Uncontrolled, ok for trial increased ambien to 20 qhs prn

## 2021-02-26 NOTE — Assessment & Plan Note (Signed)
With chronic pain, for f/u [facility] as per pt

## 2021-02-26 NOTE — Assessment & Plan Note (Signed)
Apparently being failed by [facility], will change provider to [facility] for 2L home o2 nightly

## 2021-02-26 NOTE — Assessment & Plan Note (Addendum)
Lab Results  Component Value Date   HGBA1C 6.1 12/01/2020   Stable, pt to continue current medical treatment - diet, and refer nutrition as requested

## 2021-02-28 ENCOUNTER — Other Ambulatory Visit: Payer: Self-pay

## 2021-02-28 DIAGNOSIS — G4733 Obstructive sleep apnea (adult) (pediatric): Secondary | ICD-10-CM | POA: Diagnosis not present

## 2021-02-28 DIAGNOSIS — J9620 Acute and chronic respiratory failure, unspecified whether with hypoxia or hypercapnia: Secondary | ICD-10-CM | POA: Diagnosis not present

## 2021-02-28 DIAGNOSIS — R0600 Dyspnea, unspecified: Secondary | ICD-10-CM | POA: Diagnosis not present

## 2021-03-01 ENCOUNTER — Other Ambulatory Visit: Payer: Self-pay | Admitting: Internal Medicine

## 2021-03-01 NOTE — Telephone Encounter (Signed)
Please refill as per office routine med refill policy (all routine meds to be refilled for 3 mo or monthly (per pt preference) up to one year from last visit, then month to month grace period for 3 mo, then further med refills will have to be denied) ? ?

## 2021-03-03 DIAGNOSIS — M47816 Spondylosis without myelopathy or radiculopathy, lumbar region: Secondary | ICD-10-CM | POA: Diagnosis not present

## 2021-03-07 ENCOUNTER — Telehealth: Payer: Self-pay

## 2021-03-07 NOTE — Telephone Encounter (Signed)
Please advise as the pt has stated he needs his home Health order to be updated to 4 hours a day including weekends if needed. Which will put [facility] care needs for 20 hours a week.  **Pt is needing this in place before having shoulder surgery on 03/17/2021.  **Pt was told PCP has to send in a new order with updated info above.  Number for [place]  .

## 2021-03-07 NOTE — Telephone Encounter (Signed)
[place] for verbal if this is ok to [facility] provider involved

## 2021-03-08 NOTE — Progress Notes (Addendum)
Surgical Instructions    Your procedure is scheduled on Thursday October 27th.  Report to [facility] Main Entrance "A" at 10:15 A.M., then check in with the Admitting office.  Call this number if you have problems the morning of surgery:  [phone]   If you have any questions prior to your surgery date call [phone]: Open Monday-Friday 8am-4pm    Remember:  Do not eat after midnight the night before your surgery  You may drink clear liquids until 9:15am the morning of your surgery.   Clear liquids allowed are: Water, Non-Citrus Juices (without pulp), Carbonated Beverages, Clear Tea, Black Coffee ONLY (NO MILK, CREAM OR POWDERED CREAMER of any kind), and Gatorade  Enhanced Recovery after Surgery for Orthopedics Enhanced Recovery after Surgery is a protocol used to improve the stress on your body and your recovery after surgery.  Patient Instructions  The day of surgery (if you do NOT have diabetes):  Drink ONE (1) Pre-Surgery Clear Ensure by 9:15am the morning of surgery   This drink was given to you during your hospital  pre-op appointment visit. Nothing else to drink after completing the  Pre-Surgery Clear Ensure.          If you have questions, please contact your surgeon's office.     Take these medicines the morning of surgery with A SIP OF WATER atorvastatin (LIPITOR) 20 MG tablet DULoxetine 40 MG CPEP fluticasone (FLONASE) 50 MCG/ACT nasal spray gabapentin (NEURONTIN) 800 MG tablet HYDROcodone-acetaminophen (NORCO) 10-325 MG tablet pantoprazole (PROTONIX) 40 MG tablet tamsulosin (FLOMAX) 0.4 MG CAPS capsule   IF NEEDED albuterol (VENTOLIN HFA) 108 (90 Base) MCG/ACT inhaler please bring with you to the hospital budesonide-formoterol (SYMBICORT) 160-4.5 MCG/ACT inhaler please bring with you to the hospital methocarbamol (ROBAXIN) 500 MG tablet  HOLD your buprenorphine for three days prior to surgery.  As of today, STOP taking any Celebrex, Aspirin (unless  otherwise instructed by your surgeon) Aleve, Naproxen, Ibuprofen, Motrin, Advil, Goody's, BC's, all herbal medications, fish oil, and all vitamins.  Follow your surgeon's instructions on when to stop Aspirin.  If no instructions were given by your surgeon then you will need to call the office to get those instructions.      After your COVID test   You are not required to quarantine however you are required to wear a well-fitting mask when you are out and around people not in your household.  If your mask becomes wet or soiled, replace with a new one.  Wash your hands often with soap and water for 20 seconds or clean your hands with an alcohol-based hand sanitizer that contains at least 60% alcohol.  Do not share personal items.  Notify your provider: if you are in close contact with someone who has COVID  or if you develop a fever of 100.4 or greater, sneezing, cough, sore throat, shortness of breath or body aches.             Do not wear jewelry  Do not wear lotions, powders, colognes, or deodorant. Do not shave 48 hours prior to surgery.  Men may shave face and neck. Do not bring valuables to the hospital. DO Not wear nail polish, gel polish, artificial nails, or any other type of covering on natural nails including finger and toenails. If patients have artificial nails, gel coating, etc. that need to be removed by a nail salon, please have this removed prior to surgery or surgery may need to be canceled/delayed if the surgeon/ anesthesia  feels like the patient is unable to be adequately monitored.             [facility] is not responsible for any belongings or valuables.  Do NOT Smoke (Tobacco/Vaping)  24 hours prior to your procedure  If you use a CPAP at night, you may bring your mask for your overnight stay.   Contacts, glasses, hearing aids, dentures or partials may not be worn into surgery, please bring cases for these belongings   For patients admitted to the hospital,  discharge time will be determined by your treatment team.   Patients discharged the day of surgery will not be allowed to drive home, and someone needs to stay with them for 24 hours.  NO VISITORS WILL BE ALLOWED IN PRE-OP WHERE PATIENTS ARE PREPPED FOR SURGERY.  ONLY 1 SUPPORT PERSON MAY BE PRESENT IN THE WAITING ROOM WHILE YOU ARE IN SURGERY.  IF YOU ARE TO BE ADMITTED, ONCE YOU ARE IN YOUR ROOM YOU WILL BE ALLOWED TWO (2) VISITORS. 1 (ONE) VISITOR MAY STAY OVERNIGHT BUT MUST ARRIVE TO THE ROOM BY 8pm.  Minor children may have two parents present. Special consideration for safety and communication needs will be reviewed on a case by case basis.    [place]- Preparing for Shoulder Surgery  ?  Before surgery, you can play an important role. Because skin is not sterile, your skin needs to be as free of germs as possible. You can reduce the number of germs on your skin by using the following products.   1). Benzoyl Peroxide Gel: reduces the number of germs present on the skin   *Applied twice a day to shoulder area starting two days before surgery     2). Chlorhexidine Gluconate (CHG) Soap: An antiseptic cleaner that kills germs and bonds with the skin to continue killing germs even after washing   *Used for showering the night before surgery and morning of surgery   ?    Please follow these instructions carefully:     1). BENZOYL PEROXIDE 5% GEL (Please do not use if you have an allergy to benzoyl peroxide.   If your skin becomes reddened/irritated stop using the benzoyl peroxide)     Starting TWO DAYS BEFORE surgery:    Apply benzoyl peroxide in the morning and at night. Apply after taking a shower. If you are not taking a shower clean entire shoulder front, back, and side along with the armpit with a clean wet washcloth.     Place a quarter-sized amount of gel on your shoulder and rub in thoroughly, making sure to cover the front, back, and side of your shoulder, along with  the armpit.                           Do this twice a day for two days.  (Last application is the night before surgery, AFTER using the CHG soap as described below).   Do NOT apply benzoyl peroxide gel on the day of surgery.   2 days before ____ AM   ____ PM              1 day before ____ AM   ____ PM    2) CHG Soap: Please do not use if you have an allergy to CHG or antibacterial soaps. If your skin becomes reddened/irritated stop using the CHG.  Do not shave (including legs and underarms) for at least 48 hours prior  to first CHG shower. It is OK to shave your face.  Please follow these instructions carefully.   Shower the NIGHT BEFORE SURGERY (before applying benzoyl peroxide gel) and the MORNING OF SURGERY with CHG Soap.   If you chose to wash your hair, wash your hair first as usual with your normal shampoo.  After you shampoo, rinse your hair and body thoroughly to remove the shampoo.  Use CHG as you would any other liquid soap. You can apply CHG directly to the skin and wash gently with a scrungie or a clean washcloth.   Apply the CHG Soap to your body ONLY FROM THE NECK DOWN.  Do not use on open wounds or open sores. Avoid contact with your eyes, ears, mouth and genitals (private parts). Wash Face and genitals (private parts) with your normal soap.   Wash thoroughly, paying special attention to the area where your surgery will be performed.  Thoroughly rinse your body with warm water from the neck down.  DO NOT shower/wash with your normal soap after using and rinsing off the CHG Soap.  Pat yourself dry with a CLEAN TOWEL.  Wear CLEAN PAJAMAS to bed the night before surgery  Place CLEAN SHEETS on your bed the night of your first shower and DO NOT SLEEP WITH PETS.  Oral Hygiene is also important to reduce your risk of infection.  Remember - BRUSH YOUR TEETH THE MORNING OF SURGERY WITH YOUR REGULAR TOOTHPASTE  Day of Surgery: Wear Clean/Comfortable clothing the morning  of surgery Do not apply any deodorants/lotions.   Remember to brush your teeth WITH YOUR REGULAR TOOTHPASTE.    Please read over the following fact sheets that you were given.

## 2021-03-09 ENCOUNTER — Encounter (HOSPITAL_COMMUNITY): Payer: Self-pay

## 2021-03-09 ENCOUNTER — Other Ambulatory Visit: Payer: Self-pay

## 2021-03-09 ENCOUNTER — Encounter (HOSPITAL_COMMUNITY)
Admission: RE | Admit: 2021-03-09 | Discharge: 2021-03-09 | Disposition: A | Discharge status: Home / Self Care | Payer: Medicare Other | Source: Ambulatory Visit | Attending: Orthopedic Surgery | Admitting: Orthopedic Surgery

## 2021-03-09 VITALS — BP 122/53 | HR 73 | Temp 98.5°F | Resp 20 | Ht 73.0 in | Wt 299.0 lb

## 2021-03-09 DIAGNOSIS — E1169 Type 2 diabetes mellitus with other specified complication: Secondary | ICD-10-CM | POA: Insufficient documentation

## 2021-03-09 DIAGNOSIS — E669 Obesity, unspecified: Secondary | ICD-10-CM | POA: Insufficient documentation

## 2021-03-09 DIAGNOSIS — Z01818 Encounter for other preprocedural examination: Secondary | ICD-10-CM | POA: Diagnosis not present

## 2021-03-09 HISTORY — DX: Dyspnea, unspecified: R06.00

## 2021-03-09 HISTORY — DX: Cardiac arrhythmia, unspecified: I49.9

## 2021-03-09 LAB — HEMOGLOBIN A1C
Hgb A1c MFr Bld: 5.8 % — ABNORMAL HIGH (ref 4.8–5.6)
Mean Plasma Glucose: 119.76 mg/dL

## 2021-03-09 LAB — URINALYSIS, ROUTINE W REFLEX MICROSCOPIC
Bilirubin Urine: NEGATIVE
Glucose, UA: NEGATIVE mg/dL
Hgb urine dipstick: NEGATIVE
Ketones, ur: NEGATIVE mg/dL
Leukocytes,Ua: NEGATIVE
Nitrite: NEGATIVE
Protein, ur: NEGATIVE mg/dL
Specific Gravity, Urine: 1.028 (ref 1.005–1.030)
pH: 5 (ref 5.0–8.0)

## 2021-03-09 LAB — SURGICAL PCR SCREEN
MRSA, PCR: NEGATIVE
Staphylococcus aureus: NEGATIVE

## 2021-03-09 LAB — CBC
HCT: 43.1 % (ref 39.0–52.0)
Hemoglobin: 14.5 g/dL (ref 13.0–17.0)
MCH: 30.3 pg (ref 26.0–34.0)
MCHC: 33.6 g/dL (ref 30.0–36.0)
MCV: 90.2 fL (ref 80.0–100.0)
Platelets: 259 10*3/uL (ref 150–400)
RBC: 4.78 MIL/uL (ref 4.22–5.81)
RDW: 14.6 % (ref 11.5–15.5)
WBC: 5.9 10*3/uL (ref 4.0–10.5)
nRBC: 0 % (ref 0.0–0.2)

## 2021-03-09 LAB — BASIC METABOLIC PANEL
Anion gap: 9 (ref 5–15)
BUN: 23 mg/dL (ref 8–23)
CO2: 23 mmol/L (ref 22–32)
Calcium: 9.6 mg/dL (ref 8.9–10.3)
Chloride: 106 mmol/L (ref 98–111)
Creatinine, Ser: 1.24 mg/dL (ref 0.61–1.24)
GFR, Estimated: >60 mL/min (ref >60)
Glucose, Bld: 111 mg/dL — ABNORMAL HIGH (ref 70–99)
Potassium: 3.9 mmol/L (ref 3.5–5.1)
Sodium: 138 mmol/L (ref 135–145)

## 2021-03-09 NOTE — Progress Notes (Signed)
PCP - Dr. [name] Cardiologist - Dr. Angelena Form  Chest x-ray - Not indicated EKG - 07/26/20 Stress Test - 03/26/18 ECHO - 01/09/18 Cardiac Cath - Yes 13-14 years ago  Sleep Study - Yes has OSA CPAP - On and off awaiting parts  DM - Denies currently having, last A1C was in normal range per patient  Blood Thinner Instructions: effient Asked patient to call for instructions on when to stop Aspirin Instructions: Asked patient to call for instructions on when to stop  ERAS Protcol - Yes  PRE-SURGERY Ensure    COVID TEST- 03/14/21 @ 10am   Anesthesia review: Yes cardiac history  Patient denies shortness of breath, fever, cough and chest pain at PAT appointment   All instructions explained to the patient, with a verbal understanding of the material. Patient agrees to go over the instructions while at home for a better understanding. Patient also instructed to wear a mask while in public after being tested for COVID-19. The opportunity to ask questions was provided.

## 2021-03-10 ENCOUNTER — Telehealth: Payer: Self-pay | Admitting: Cardiovascular Disease

## 2021-03-10 NOTE — Telephone Encounter (Signed)
   [facility] Pre-operative Risk Assessment    Patient Name: Travis [name]  DOB: 1951/04/12 MRN: [id number]  HEARTCARE STAFF:  - IMPORTANT!!!!!! Under Visit Info/Reason for Call, type in Other and utilize the format Clearance MM/DD/YY or Clearance TBD. Do not use dashes or single digits. - Please review there is not already an duplicate clearance open for this procedure. - If request is for dental extraction, please clarify the # of teeth to be extracted. - If the patient is currently at the dentist's office, call Pre-Op Callback Staff (MA/nurse) to input urgent request.  - If the patient is not currently in the dentist office, please route to the Pre-Op pool.  Request for surgical clearance:  What type of surgery is being performed? Left shoulder replacement   When is this surgery scheduled? 03/17/21  What type of clearance is required (medical clearance vs. Pharmacy clearance to hold med vs. Both)? pharmacy  Are there any medications that need to be held prior to surgery and how long? Deferring to provider  Practice name and name of physician performing surgery? [facility], Dr. Alphonzo Severance  What is the office phone number? [phone]   7.   What is the office fax number? [phone]  8.   Anesthesia type (None, local, MAC, general) ? general   Ermelinda Das 03/10/2021, 4:32 PM  _________________________________________________________________   (provider comments below)

## 2021-03-10 NOTE — Telephone Encounter (Signed)
Primary Cardiologist:Christopher Angelena Form, MD  Chart reviewed as part of pre-operative protocol coverage. Because of Travis STARACE Sr.'s past medical history and time since last visit, he/she will require a follow-up visit in order to better assess preoperative cardiovascular risk.  Pre-op covering staff: - Please schedule appointment and call patient to inform them. - Please contact requesting surgeon's office via preferred method (i.e, phone, fax) to inform them of need for appointment prior to surgery.  If applicable, this message will also be routed to pharmacy pool and/or primary cardiologist for input on holding anticoagulant/antiplatelet agent as requested below so that this information is available at time of patient's appointment.   Deberah Pelton, NP  03/10/2021, 4:40 PM
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
## 2021-03-11 DIAGNOSIS — Z89511 Acquired absence of right leg below knee: Secondary | ICD-10-CM | POA: Diagnosis not present

## 2021-03-11 DIAGNOSIS — R3915 Urgency of urination: Secondary | ICD-10-CM | POA: Diagnosis not present

## 2021-03-11 NOTE — Telephone Encounter (Signed)
Debbie from [place] returning call. She states she is aware of the patient's appointment 10/24 and if there is questions or more information her number is: [phone]

## 2021-03-11 NOTE — Telephone Encounter (Signed)
Patient called saying that the next surgery day they have is November 22, and he can't be in pain that long.

## 2021-03-11 NOTE — Telephone Encounter (Signed)
Called patient to inform him why I was calling. Patient has a procedure date of 03/17/21 and all open appointments were after his scheduled date at all offices in the area. Patient asked why were we calling so late to get him scheduled for a clearance appointment and he can not get the surgery pushed back. He asked why was he needing an appointment. I explained that it was 6 months since last appointment and other reasons/diagnoses.   Called the requesting office and left a voice message informing them that patient will need to have an appointment for clearance and that the appointment will be after the scheduled 03/17/21 date and that the patient was made aware of this and he does not want the procedure pushed back. Asked to give our office a call back if there were any questions and ask for the PreOp clearance pool

## 2021-03-11 NOTE — Telephone Encounter (Signed)
I thought the request for increase was the request of the agency.  Please ask pt why he is specifically asking for the increase, as I will need to say why on the form that is mentioned, so I have to decide if that is a good idea, thanks

## 2021-03-11 NOTE — Telephone Encounter (Signed)
Per [facility], they will need why patient is requesting more hours and the change of status. They will be faxing a form Monday to fill out and fax back.

## 2021-03-11 NOTE — Telephone Encounter (Signed)
Preoperative team-please place patient on cancellation list so that he may have a earlier appointment if 1 becomes available.  Unfortunately, we are unable to provide preoperative cardiac evaluation due to the length of time it has been since his last appointment and his previous cardiac history.  He will need appointment prior to his upcoming surgery.  Thank you.  Jossie Ng. Christ Fullenwider NP-C    03/11/2021, 12:02 PM [address] Office [phone] Fax [phone]

## 2021-03-11 NOTE — Telephone Encounter (Signed)
Spoke with pt and was able to schedule pt to see Dr. Irish Lack on 10/21 at 3:30 pm. Pt verbalized understanding and thanked me for the call. Will fax over pre-op recommendations and scheduled appt time to requesting surgeon's office.

## 2021-03-11 NOTE — Telephone Encounter (Signed)
Correction to my note: pt has appt with Dr. Irish Lack on 06/14/20 @ 3:30 pm.

## 2021-03-14 ENCOUNTER — Telehealth: Payer: Self-pay | Admitting: Orthopedic Surgery

## 2021-03-14 ENCOUNTER — Other Ambulatory Visit: Payer: Self-pay

## 2021-03-14 ENCOUNTER — Encounter: Payer: Self-pay | Admitting: Interventional Cardiology

## 2021-03-14 ENCOUNTER — Ambulatory Visit (INDEPENDENT_AMBULATORY_CARE_PROVIDER_SITE_OTHER): Payer: Medicare Other | Admitting: Interventional Cardiology

## 2021-03-14 ENCOUNTER — Other Ambulatory Visit (HOSPITAL_COMMUNITY): Admission: RE | Admit: 2021-03-14 | Payer: Medicare Other | Source: Ambulatory Visit

## 2021-03-14 VITALS — BP 140/78 | HR 86 | Ht 73.0 in | Wt 296.6 lb

## 2021-03-14 DIAGNOSIS — I10 Essential (primary) hypertension: Secondary | ICD-10-CM | POA: Diagnosis not present

## 2021-03-14 DIAGNOSIS — I2510 Atherosclerotic heart disease of native coronary artery without angina pectoris: Secondary | ICD-10-CM

## 2021-03-14 DIAGNOSIS — E785 Hyperlipidemia, unspecified: Secondary | ICD-10-CM | POA: Diagnosis not present

## 2021-03-14 DIAGNOSIS — Z01810 Encounter for preprocedural cardiovascular examination: Secondary | ICD-10-CM | POA: Diagnosis not present

## 2021-03-14 NOTE — Telephone Encounter (Signed)
Pt called requesting a call back. Pt states he will need to go in a rehab facility after his surgery on this Thursday. Pt phone number is [phone]. Please call pt about this matter.

## 2021-03-14 NOTE — Progress Notes (Signed)
Cardiology Office Note   Date:  03/14/2021   ID:  Travis Bondar., DOB 1950/12/02, MRN [id number]  PCP:  [name], MD    No chief complaint on file.  CAD/preoperative clearance  Wt Readings from Last 3 Encounters:  03/14/21 296 lb 9.6 oz (134.5 kg)  03/09/21 299 lb (135.6 kg)  01/14/21 (!) 303 lb (137.4 kg)       History of Present Illness: Travis Garate. is a 70 y.o. male  who sees Dr. Angelena Form.  Prior records show: " with history of sleep apnea, chronic pain syndrome, fibromyalgia, CAD s/p multiple prior PCIs, depression and anxiety, HTN, HLD, spinal stenosis and GERD here today for cardiac follow up. He has been followed in our office by Dr. Saunders Revel and previously by Dr. Aundra Dubin. He has been maintained on ASA and Effient 5 mg daily given multiple stents and poor platelet inhibition on Plavix. He had a Taxus DES placed in the mid LAD in 2008, Endeavor DES in the Circumflex in 2008, bare metal stent mid RCA in 2009, Xience DES Circumflex 2009. Last cardiac cath in 2013 with patent stents and non-obstructive CAD. Echo August 2019 with LVEF=55-60%. Grade 1 diastolic dysfunction. No significant valve disease. Nuclear stress test November 2019 with no ischemia. LVEF noted to be 47%.  "  Son passed away from MI in 2019-11-16.  Lumbar fusion in 07/2020 with no cardiac issues.   Denies : Chest pain. Dizziness. Leg edema. Nitroglycerin use. Orthopnea. Palpitations. Paroxysmal nocturnal dyspnea. Shortness of breath. Syncope.        Past Medical History:  Diagnosis Date   ALLERGIC RHINITIS 06/24/2009   Allergy    Anemia    IDA   Anginal pain ([place])    Anxiety 11/12/2011   Adequate for discharge    Arthritis    "all my joints" (09/30/2013)   Arthritis of foot, right, degenerative 04/15/2014   Balance disorder 03/12/2013   Benign neoplasm of cecum    Benign neoplasm of descending colon    CAD (coronary artery disease) 06/24/2009   5 stents placed in 2007      Chronic anticoagulation    Chronic pain syndrome 10/27/2009   of ankle, shoulders, low back.  sciatica.    Closed fracture of right foot 10/17/2014   CORONARY ARTERY DISEASE 06/24/2009   a. s/p multiple PCIs - In 2008 he had a Taxus DES to the mild LAD, Endeavor DES to mid LCX and distal LCX. In January 2009 he had DES to distal LCX, mid LCX and proximal LCX. In November 2009 had BMS x 2 to the mid RCA. Cath 11-16-2011 with patent stents, noncardiac CP. LHC 01/2013: patent stents (noncardiac CP).   DEGENERATIVE JOINT DISEASE 06/24/2009   Qualifier: Diagnosis of  By: [name], [name]    Depression    Depression with anxiety    Prior suicide attempt(08/25/19-pt states not suicide attempt)   Miller DISEASE, LUMBAR 04/19/2010   Dyspnea    Dysrhythmia    ERECTILE DYSFUNCTION, ORGANIC 05/30/2010   Essential hypertension 06/24/2009   Qualifier: Diagnosis of  By: [name], [name]    Fibromyalgia    Fracture dislocation of ankle joint 09/02/2015   Gait disorder 03/12/2013   General weakness 07/14/2014   GERD (gastroesophageal reflux disease) 09/08/2015   08/25/15-pt states was cardiac origin, not GERD   Hand joint pain 06/10/2013   Heart murmur    Hepatitis C    treated pt. unknown with  what he was a teenager   History of kidney stones    Hyperlipidemia 07/15/2009   Qualifier: Diagnosis of  By: Aundra Dubin, MD, [place]     HYPERLIPIDEMIA-MIXED 07/15/2009   08/25/19-pt state cholesterol was normal range   HYPERTENSION 06/24/2009   Insomnia 10/04/2011   Irregular heart beat    Left hip pain 03/12/2013   Injected under ultrasound guidance on June 24, 2013    Major depression 09/13/2015   Myocardial infarction [facility]) 2008   Non-cardiac chest pain 10/2011, 01/2013   Obesity    Occult blood positive stool 10/17/2014   Open ankle fracture 09/02/2015   OSA (obstructive sleep apnea)    not using CPAP (09/30/2013)   Pain of right thumb 04/03/2013   Pneumonia    PPD positive 04/08/2015   Pre-ulcerative corn  or callous 02/06/2013   Rotator cuff tear arthropathy of both shoulders 06/10/2013   History of bilateral shoulder cuff surgery for rotator cuff tears. Reports increase in pain 09/11/2015 during physical therapy of the left shoulder.    SCIATICA, LEFT 04/19/2010   Qualifier: Diagnosis of  By: [name], [name]    Sleep apnea    wears cpap 08/25/19-States not using due to nasal stuffiness.   Spinal stenosis in cervical region 09/26/2013   Spinal stenosis, lumbar region, with neurogenic claudication 09/26/2013   Type II diabetes mellitus ([place]) 2012   no meds in 09/2014.    Umbilical hernia    Uncontrolled type 2 DM with peripheral circulatory disorder 10/04/2013   08/25/19- pt states A1C normal, no diabetes   URETHRAL STRICTURE 06/24/2009   self catheterizes.     Past Surgical History:  Procedure Laterality Date   AMPUTATION Right 06/14/2016   Procedure: AMPUTATION BELOW KNEE;  Surgeon: Newt Minion, MD;  Location: [place];  Service: Orthopedics;  Laterality: Right;   ANKLE FUSION Right 04/15/2014   Procedure: Right Subtalar, Talonavicular Fusion;  Surgeon: Newt Minion, MD;  Location: [place];  Service: Orthopedics;  Laterality: Right;   ANKLE FUSION Right 04/18/2016   Procedure: Right Ankle Tibiocalcaneal Fusion;  Surgeon: Newt Minion, MD;  Location: [place];  Service: Orthopedics;  Laterality: Right;   ANTERIOR CERVICAL DECOMP/DISCECTOMY FUSION N/A 09/26/2013   Procedure: ANTERIOR CERVICAL DISCECTOMY FUSION C3-4, plate and screw fixation, allograft bone graft;  Surgeon: Jessy Oto, MD;  Location: [place];  Service: Orthopedics;  Laterality: N/A;   BACK SURGERY     3   BELOW KNEE LEG AMPUTATION Right 06/14/2016   right ankle and foot   CARDIAC CATHETERIZATION  X 1   CARPAL TUNNEL RELEASE Bilateral    COLONOSCOPY N/A 10/22/2014   Procedure: COLONOSCOPY;  Surgeon: Lafayette Dragon, MD;  Location[facility] ENDOSCOPY;  Service: Endoscopy;  Laterality: N/A;   COLONOSCOPY  11/19/2018   CORONARY ANGIOPLASTY  WITH STENT PLACEMENT     "I have 9 stents"   ESOPHAGOGASTRODUODENOSCOPY N/A 10/19/2014   Procedure: ESOPHAGOGASTRODUODENOSCOPY (EGD);  Surgeon: Jerene Bears, MD;  Location: [facility] ENDOSCOPY;  Service: Endoscopy;  Laterality: N/A;   FRACTURE SURGERY     FUSION OF TALONAVICULAR JOINT Right 04/15/2014   dr duda   GASTRIC BYPASS  02/20/2019   HERNIA REPAIR     umbilical   INGUINAL HERNIA REPAIR Right 05/11/2015   Procedure: LAPAROSCOPIC REPAIR RIGHT  INGUINAL HERNIA;  Surgeon: Greer Pickerel, MD;  Location: [place];  Service: General;  Laterality: Right;   INSERTION OF MESH Right 05/11/2015   Procedure: INSERTION OF MESH;  Surgeon: Greer Pickerel, MD;  Location: [place];  Service: General;  Laterality: Right;   JOINT REPLACEMENT     KNEE CARTILAGE SURGERY Right X 12   "~ 1/2 open; ~ 1/2 scopes"   KNEE CARTILAGE SURGERY Left X 3   "3 scopes"   LAPAROSCOPIC GASTRIC SLEEVE RESECTION N/A 03/03/2019   Procedure: LAPAROSCOPIC GASTRIC SLEEVE RESECTION, Upper Endo, ERAS Pathway;  Surgeon: Greer Pickerel, MD;  Location: [facility];  Service: General;  Laterality: N/A;   LEFT HEART CATHETERIZATION WITH CORONARY ANGIOGRAM N/A 02/10/2013   Procedure: LEFT HEART CATHETERIZATION WITH CORONARY ANGIOGRAM;  Surgeon: Burnell Blanks, MD;  Location: [facility] CATH LAB;  Service: Cardiovascular;  Laterality: N/A;   LUMBAR LAMINECTOMY N/A 07/16/2018   Procedure: LEFT L4-5 REDO PARTIAL LUMBAR HEMILAMINECTOMY WITH FORAMINOTOMY LEFT L4;  Surgeon: Jessy Oto, MD;  Location: [place];  Service: Orthopedics;  Laterality: N/A;   LUMBAR LAMINECTOMY/DECOMPRESSION MICRODISCECTOMY N/A 01/27/2014   Procedure: CENTRAL LUMBAR LAMINECTOMY L4-5 AND L3-4;  Surgeon: Jessy Oto, MD;  Location: [place];  Service: Orthopedics;  Laterality: N/A;   ORIF ANKLE FRACTURE Right 09/02/2015   Procedure: OPEN REDUCTION INTERNAL FIXATION (ORIF) ANKLE FRACTURE;  Surgeon: Newt Minion, MD;  Location: [place];  Service: Orthopedics;  Laterality: Right;   PERIPHERALLY  INSERTED CENTRAL CATHETER INSERTION  09/02/2015   POLYPECTOMY     ROTATOR CUFF REPAIR Left    x 2   ROTATOR CUFF REPAIR Right    x 3   SHOULDER ARTHROSCOPY W/ ROTATOR CUFF REPAIR Bilateral    "3 on the right; 1 on the left"   SKIN SPLIT GRAFT Right 10/01/2015   Procedure: RIGHT ANKLE APPLY SKIN GRAFT SPLIT THICKNESS;  Surgeon: Newt Minion, MD;  Location: [place];  Service: Orthopedics;  Laterality: Right;   TONSILLECTOMY     TOOTH EXTRACTION     TOTAL HIP ARTHROPLASTY Right 04/17/2018   TOTAL HIP ARTHROPLASTY Right 04/17/2018   Procedure: RIGHT TOTAL HIP ARTHROPLASTY;  Surgeon: Newt Minion, MD;  Location: [place];  Service: Orthopedics;  Laterality: Right;   TOTAL HIP ARTHROPLASTY Left 06/06/2019   Procedure: LEFT TOTAL HIP ARTHROPLASTY ANTERIOR APPROACH;  Surgeon: Mcarthur Rossetti, MD;  Location: [place];  Service: Orthopedics;  Laterality: Left;   TOTAL HIP REVISION Left 05/24/2019   TOTAL KNEE ARTHROPLASTY Bilateral 2751   UMBILICAL HERNIA REPAIR     UHR   UPPER GASTROINTESTINAL ENDOSCOPY  2016   URETHRAL DILATION  X 4   VASECTOMY     WISDOM TOOTH EXTRACTION       Current Outpatient Medications  Medication Sig Dispense Refill   albuterol (VENTOLIN HFA) 108 (90 Base) MCG/ACT inhaler Inhale 2 puffs into the lungs every 6 (six) hours as needed for wheezing or shortness of breath. 18 g 1   amoxicillin (AMOXIL) 500 MG capsule Take 500 mg by mouth as needed. For dental procedure     ARIPiprazole (ABILIFY) 10 MG tablet Take 1 tablet (10 mg total) by mouth daily. 90 tablet 0   aspirin EC 81 MG tablet Take 81 mg by mouth at bedtime. Swallow whole.     atorvastatin (LIPITOR) 20 MG tablet Take 1 tablet (20 mg total) by mouth daily. 90 tablet 0   Azelastine-Fluticasone 137-50 MCG/ACT SUSP Use both nostrils as directed twice daily 23 g 11   budesonide-formoterol (SYMBICORT) 160-4.5 MCG/ACT inhaler Inhale 2 puffs into the lungs 2 (two) times daily. 3 each 3   buprenorphine (BUTRANS) 20  MCG/HR PTWK  Place 1 patch onto the skin every Thursday.     calcium carbonate (TUMS - DOSED IN MG ELEMENTAL CALCIUM) 500 MG chewable tablet Chew 1 tablet by mouth 3 (three) times daily with meals.     celecoxib (CELEBREX) 200 MG capsule Take 1 capsule (200 mg total) by mouth 2 (two) times daily. 40 capsule 0   cetirizine (ZYRTEC) 10 MG tablet Take 1 tablet (10 mg total) by mouth daily. 30 tablet 11   Cholecalciferol (VITAMIN D3 PO) Take 2 tablets by mouth at bedtime.     diclofenac Sodium (VOLTAREN) 1 % GEL Apply 4 g topically 3 (three) times daily as needed (pain). 350 g 5   donepezil (ARICEPT) 5 MG tablet Take 1 tablet (5 mg total) by mouth at bedtime. 90 tablet 3   DULoxetine (CYMBALTA) 20 MG capsule Take 40 mg by mouth in the morning and at bedtime.     DULoxetine 40 MG CPEP Take 1 capsule (79m total) by mouth daily. 45 capsule 0   Ferrous Sulfate (IRON PO) Take by mouth.     fluticasone (FLONASE) 50 MCG/ACT nasal spray Place 2 sprays into both nostrils in the morning and at bedtime.     gabapentin (NEURONTIN) 800 MG tablet Take 800 mg by mouth 3 (three) times daily.     hydrochlorothiazide (HYDRODIURIL) 25 MG tablet Take 1 tablet (25 mg total) by mouth daily. 90 tablet 3   HYDROcodone-acetaminophen (NORCO) 10-325 MG tablet Take 1 tablet by mouth in the morning, at noon, and at bedtime.     losartan (COZAAR) 50 MG tablet Take 1 tablet (50 mg total) by mouth daily. 90 tablet 3   melatonin 5 MG TABS Take 15 mg by mouth at bedtime.     methocarbamol (ROBAXIN) 500 MG tablet Take 1 tablet (500 mg total) by mouth every 8 (eight) hours as needed for muscle spasms. 90 tablet 1   MYRBETRIQ 50 MG TB24 tablet Take 50 mg by mouth at bedtime.     Omega-3 Fatty Acids (FISH OIL) 1000 MG CAPS Take 2,000 mg by mouth at bedtime.     Pancrelipase, Lip-Prot-Amyl, (ZENPEP) 40000-126000 units CPEP Take 2 capsules (80,000 units) with meals and 1 capsule (40,000 units) with each snack. PLEASE SCHEDULE A YEARLY  FOLLOW UP FOR FURTHER REFILLS. Thank you 240 capsule 1   pantoprazole (PROTONIX) 40 MG tablet Take 40 mg by mouth daily.     prasugrel (EFFIENT) 5 MG TABS tablet Take 1 tablet (5 mg total) by mouth daily. 90 tablet 3   tamsulosin (FLOMAX) 0.4 MG CAPS capsule Take 0.4 mg by mouth 2 (two) times daily.     testosterone (ANDROGEL) 50 MG/5GM (1%) GEL Place 5 g onto the skin daily. 150 g 5   traZODone (DESYREL) 150 MG tablet Take 1 tablet (150 mg total) by mouth at bedtime. 90 tablet 0   zolpidem (AMBIEN) 10 MG tablet Take 2 tablets (20 mg total) by mouth at bedtime as needed for sleep. 180 tablet 1   benztropine (COGENTIN) 0.5 MG tablet Take 1 tablet (0.5 mg total) by mouth at bedtime. (Patient not taking: Reported on 03/14/2021) 90 tablet 0   phentermine 37.5 MG capsule Take 1 capsule (37.5 mg total) by mouth every morning. (Patient not taking: Reported on 03/14/2021) 30 capsule 0   predniSONE (DELTASONE) 10 MG tablet 3 tabs by mouth per day for 3 days,2tabs per day for 3 days,1tab per day for 3 days (Patient not taking: Reported on 03/14/2021) 18 tablet  0   Testosterone 1.62 % GEL Place 1 Pump onto the skin daily. (Patient not taking: Reported on 03/14/2021) 88 g 1   No current facility-administered medications for this visit.    Allergies:   Patient has no known allergies.    Social History:  The patient  reports that he quit smoking about 10 years ago. His smoking use included cigars. He has never used smokeless tobacco. He reports current alcohol use. He reports that he does not use drugs.   Family History:  The patient's family history includes Breast cancer in his mother; Colon polyps in his brother; Coronary artery disease in an other family member; Depression in his brother, mother, and another family member; Diabetes in his father; Early death in his maternal grandfather, paternal grandfather, and son; Esophageal cancer in his mother; Healthy in his son; Heart attack (age of onset: 66) in  his son; Heart attack (age of onset: 81) in his maternal grandfather; Heart disease in his father, maternal grandfather, and mother; Hyperlipidemia in his father; Hypertension in his brother, father, mother, and another family member; Pancreatic cancer in his mother; Prostate cancer in his father; Skin cancer in his father; Stomach cancer in his mother.    ROS:  Please see the history of present illness.   Otherwise, review of systems are positive for left shoulder pain- needs replacement.   All other systems are reviewed and negative.    PHYSICAL EXAM: VS:  BP 140/78   Pulse 86   Ht 6' 1"  (1.854 m)   Wt 296 lb 9.6 oz (134.5 kg)   SpO2 97%   BMI 39.13 kg/m  , BMI Body mass index is 39.13 kg/m. GEN: Well nourished, well developed, in no acute distress HEENT: normal Neck: no JVD, carotid bruits, or masses Cardiac: RRR; no murmurs, rubs, or gallops,no edema  Respiratory:  clear to auscultation bilaterally, normal work of breathing GI: soft, nontender, nondistended, + BS MS: right BKA; [facility] prosthetic Skin: warm and dry, no rash Neuro:  Strength and sensation are intact Psych: euthymic mood, full affect   EKG:   The ekg ordered today demonstrates NSR, IVCD   Recent Labs: 12/01/2020: ALT 11 03/09/2021: BUN 23; Creatinine, Ser 1.24; Hemoglobin 14.5; Platelets 259; Potassium 3.9; Sodium 138   Lipid Panel    Component Value Date/Time   CHOL 113 12/01/2020 1348   TRIG 85.0 12/01/2020 1348   HDL 42.30 12/01/2020 1348   CHOLHDL 3 12/01/2020 1348   VLDL 17.0 12/01/2020 1348   LDLCALC 54 12/01/2020 1348     Other studies Reviewed: Additional studies/ records that were reviewed today with results demonstrating: labs reviewed.   ASSESSMENT AND PLAN:  CAD/preoperative clearance: Now on DAPT with Effient 5 mg daily.  Already off of Effient and aspirin in anticipation of surgery.  No angina.  Continue aggressive secondary prevention.  No further cardiac testing required  prior to surgery. Hyperlipidemia: LDL 54 in 11/2020.  COntinue atorvastatin.  The current medical regimen is effective;  continue present plan and medications. HTN: The current medical regimen is effective;  continue present plan and medications. Morbid obesity: Has lost 40 lbs over the few past months.  H/o bariatric surgery.  Gained weight back after his son died.    Current medicines are reviewed at length with the patient today.  The patient concerns regarding his medicines were addressed.  The following changes have been made:  No change  Labs/ tests ordered today include:  No orders of the  defined types were placed in this encounter.   Recommend 150 minutes/week of aerobic exercise Low fat, low carb, high fiber diet recommended  Disposition:   FU with Dr. Donnella Sham as scheduled   Signed, Larae Grooms, MD  03/14/2021 3:38 PM    [facility] [place], [place], [place]  [address] Phone: [phone]; Fax: [phone]

## 2021-03-14 NOTE — Telephone Encounter (Signed)
IC advised you would add orders for this post op

## 2021-03-14 NOTE — Patient Instructions (Signed)
Medication Instructions:  Your physician recommends that you continue on your current medications as directed. Please refer to the Current Medication list given to you today.  *If you need a refill on your cardiac medications before your next appointment, please call your pharmacy*   Lab Work: NONE If you have labs (blood work) drawn today and your tests are completely normal, you will receive your results only by: [place] (if you have MyChart) OR A paper copy in the mail If you have any lab test that is abnormal or we need to change your treatment, we will call you to review the results.   Testing/Procedures: NONE   Follow-Up: As scheduled  At [facility], you and your health needs are our priority.  As part of our continuing mission to provide you with exceptional heart care, we have created designated Provider Care Teams.  These Care Teams include your primary Cardiologist (physician) and Advanced Practice Providers (APPs -  Physician Assistants and Nurse Practitioners) who all work together to provide you with the care you need, when you need it.

## 2021-03-15 NOTE — Anesthesia Preprocedure Evaluation (Addendum)
Anesthesia Evaluation  Patient identified by MRN, date of birth, ID band Patient awake    Reviewed: Allergy & Precautions, NPO status , Patient's Chart, lab work & pertinent test results  Airway Mallampati: II  TM Distance: >3 FB Neck ROM: Full    Dental  (+) Dental Advisory Given, Poor Dentition, Missing   Pulmonary shortness of breath, sleep apnea , pneumonia, former smoker,    Pulmonary exam normal breath sounds clear to auscultation       Cardiovascular hypertension, Pt. on medications + angina + CAD, + Past MI, + Cardiac Stents (x 9) and + Peripheral Vascular Disease  Normal cardiovascular exam+ dysrhythmias + Valvular Problems/Murmurs  Rhythm:Regular Rate:Normal     Neuro/Psych  Headaches, PSYCHIATRIC DISORDERS Anxiety Depression  Neuromuscular disease    GI/Hepatic GERD  ,(+) Hepatitis -, C  Endo/Other  diabetesMorbid obesity  Renal/GU negative Renal ROS     Musculoskeletal  (+) Arthritis , Fibromyalgia -  Abdominal (+) + obese,   Peds  Hematology  (+) Blood dyscrasia, anemia ,   Anesthesia Other Findings   Reproductive/Obstetrics                                                             Anesthesia Evaluation  Patient identified by MRN, date of birth, ID band Patient awake    Reviewed: Allergy & Precautions, H&P , NPO status , Patient's Chart, lab work & pertinent test results  Airway Mallampati: II  TM Distance: >3 FB Neck ROM: Full    Dental no notable dental hx. (+) Partial Lower, Partial Upper, Dental Advisory Given   Pulmonary sleep apnea and Continuous Positive Airway Pressure Ventilation , former smoker,    Pulmonary exam normal breath sounds clear to auscultation       Cardiovascular Exercise Tolerance: Good hypertension, Pt. on medications + CAD, + Past MI, + Cardiac Stents and + Peripheral Vascular Disease   Rhythm:Regular Rate:Normal      Neuro/Psych  Headaches, Anxiety Depression    GI/Hepatic Neg liver ROS, GERD  Medicated,  Endo/Other  diabetesMorbid obesity  Renal/GU negative Renal ROS  negative genitourinary   Musculoskeletal  (+) Arthritis , Osteoarthritis,  Fibromyalgia -  Abdominal   Peds  Hematology negative hematology ROS (+)   Anesthesia Other Findings   Reproductive/Obstetrics negative OB ROS                           Anesthesia Physical Anesthesia Plan  ASA: III  Anesthesia Plan: General   Post-op Pain Management:    Induction: Intravenous  PONV Risk Score and Plan: 3 and Ondansetron, Dexamethasone and Midazolam  Airway Management Planned: Oral ETT  Additional Equipment: Arterial line  Intra-op Plan:   Post-operative Plan: Extubation in OR and Possible Post-op intubation/ventilation  Informed Consent: I have reviewed the patients History and Physical, chart, labs and discussed the procedure including the risks, benefits and alternatives for the proposed anesthesia with the patient or authorized representative who has indicated his/her understanding and acceptance.     Dental advisory given  Plan Discussed with: CRNA  Anesthesia Plan Comments: (PAT note by Travis Caldwell, PA-C:  Follows with cardiology for hx of HLD, HTN, CAD (DES 2008, DES 2009, BMS 2009, patent stents on cath 10/2011 and 01/2013).  Nuclear stress test in 2019 was negative for ischemia.  EF was normal by echocardiogram in 8/19. Clearance per telephone encounter 05/27/20, "Chart reviewed as part of pre-operative protocol coverage. Given past medical history and time since last visit, based on ACC/AHA guidelines, Travis WEED Sr. would be at acceptable risk for the planned procedure without further cardiovascular testing. Per Dr. Angelena Form, he may hold ASA and Effient prior to procedure and restart once stable from a surgical standpoint." He had a regularly scheduled followup with Dr. Angelena Form on  07/26/20 and he also commented on surgery. Per note, "Pre-operative cardiovascular risk assessment: He is limited due to his back pain and obesity. No angina, CHF or arrhythmias. He can proceed with his planned surgical procedure. He has been holding his ASA and Effient for the past 6 days."  S/p laparascopic gastric sleeve resection 74/08/14 without complication.  OSA on CPAP.  Per pt. Last dose effient and asa 07/20/20.  Preop labs reviewed, unremarkable.  Myocardial Perfusion 03/26/2018 Nuclear stress EF: 47%. There was no ST segment deviation noted during stress. No T wave inversion was noted during stress. This is an intermediate risk study due to reduced systolic function. There is no ischemia. The left ventricular ejection fraction is mildly decreased (45-54%).  Echo 01/09/2018 Study Conclusions  - Left ventricle: The cavity size was normal. Systolic function was normal. The estimated ejection fraction was in the range of 55% to 60%. Wall motion was normal; there were no regional wall motion abnormalities. Doppler parameters are consistent with abnormal left ventricular relaxation (grade 1 diastolic dysfunction). - Aortic valve: Transvalvular velocity was within the normal range. There was no stenosis. There was no regurgitation. - Mitral valve: Transvalvular velocity was within the normal range. There was no evidence for stenosis. There was trivial regurgitation. - Right ventricle: The cavity size was normal. Wall thickness was normal. Systolic function was normal. - Tricuspid valve: There was trivial regurgitation. - Pulmonary arteries: Systolic pressure was within the normal range. PA peak pressure: 24 mm Hg (S). )    Anesthesia Quick Evaluation  Anesthesia Physical Anesthesia Plan  ASA: 3  Anesthesia Plan: General   Post-op Pain Management: GA combined w/ Regional for post-op pain   Induction: Intravenous  PONV Risk Score and Plan: 2 and  Ondansetron, Dexamethasone, Treatment may vary due to age or medical condition and Midazolam  Airway Management Planned: Oral ETT  Additional Equipment: None  Intra-op Plan:   Post-operative Plan: Extubation in OR  Informed Consent: I have reviewed the patients History and Physical, chart, labs and discussed the procedure including the risks, benefits and alternatives for the proposed anesthesia with the patient or authorized representative who has indicated his/her understanding and acceptance.     Dental advisory given  Plan Discussed with: CRNA  Anesthesia Plan Comments: (PAT note written by Travis Gianotti, PA-C. )      Anesthesia Quick Evaluation

## 2021-03-16 ENCOUNTER — Ambulatory Visit (INDEPENDENT_AMBULATORY_CARE_PROVIDER_SITE_OTHER): Payer: Medicare Other | Admitting: Internal Medicine

## 2021-03-16 ENCOUNTER — Other Ambulatory Visit: Payer: Self-pay

## 2021-03-16 ENCOUNTER — Encounter: Payer: Self-pay | Admitting: Internal Medicine

## 2021-03-16 DIAGNOSIS — M25512 Pain in left shoulder: Secondary | ICD-10-CM

## 2021-03-16 DIAGNOSIS — G894 Chronic pain syndrome: Secondary | ICD-10-CM | POA: Diagnosis not present

## 2021-03-16 DIAGNOSIS — E559 Vitamin D deficiency, unspecified: Secondary | ICD-10-CM

## 2021-03-16 DIAGNOSIS — M7632 Iliotibial band syndrome, left leg: Secondary | ICD-10-CM | POA: Diagnosis not present

## 2021-03-16 DIAGNOSIS — M7631 Iliotibial band syndrome, right leg: Secondary | ICD-10-CM

## 2021-03-16 DIAGNOSIS — I10 Essential (primary) hypertension: Secondary | ICD-10-CM

## 2021-03-16 NOTE — Patient Instructions (Signed)
Ok to try the Voltaren gel as needed for the pain  Please see Sports Medicine for the pain as well  Please continue all other medications as before, and refills have been done if requested.  Please have the pharmacy call with any other refills you may need.  Please keep your appointments with your specialists as you may have planned  Good Luck with your shoulder surgury tomorrow

## 2021-03-16 NOTE — Telephone Encounter (Signed)
Was this discussed at visit 03/16/21?

## 2021-03-16 NOTE — Assessment & Plan Note (Signed)
At least moderate, recent flare now persistent, for volt gel prn, also f/u sport medicine

## 2021-03-16 NOTE — Progress Notes (Signed)
Patient ID: Travis [name]., male   DOB: [birth year], 70 y.o.   MRN: [id number]        Chief Complaint: follow up bilateral lateral upper leg pain       HPI:  Travis [name]. is a 70 y.o. male here with above, has marked sensitive to touch bilateral lateral upper legs, worse, mod, intermittent, worse with dog jumping on the lap, worse to walk, nothing else makes better or worse.  No falls or lower back pain worsening.   Pt denies fever, wt loss, night sweats, loss of appetite, or other constitutional symptoms  Pt denies chest pain, increased sob or doe, wheezing, orthopnea, PND, increased LE swelling, palpitations, dizziness or syncope.   Pt denies polydipsia, polyuria.  Due for left reverse shoulder surgury tomorrow.  Now taking vit d       Wt Readings from Last 3 Encounters:  03/16/21 299 lb (135.6 kg)  03/14/21 296 lb 9.6 oz (134.5 kg)  03/09/21 299 lb (135.6 kg)   BP Readings from Last 3 Encounters:  03/16/21 140/80  03/14/21 140/78  03/09/21 (!) 122/53         Past Medical History:  Diagnosis Date   ALLERGIC RHINITIS 06/24/2009   Allergy    Anemia    IDA   Anginal pain ([place])    Anxiety 11/12/2011   Adequate for discharge    Arthritis    "all my joints" (09/30/2013)   Arthritis of foot, right, degenerative 04/15/2014   Balance disorder 03/12/2013   Benign neoplasm of cecum    Benign neoplasm of descending colon    CAD (coronary artery disease) 06/24/2009   5 stents placed in 2007     Chronic anticoagulation    Chronic pain syndrome 10/27/2009   of ankle, shoulders, low back.  sciatica.    Closed fracture of right foot 10/17/2014   CORONARY ARTERY DISEASE 06/24/2009   a. s/p multiple PCIs - In 2008 he had a Taxus DES to the mild LAD, Endeavor DES to mid LCX and distal LCX. In January 2009 he had DES to distal LCX, mid LCX and proximal LCX. In November 2009 had BMS x 2 to the mid RCA. Cath 10/2011 with patent stents, noncardiac CP. LHC 01/2013: patent stents  (noncardiac CP).   DEGENERATIVE JOINT DISEASE 06/24/2009   Qualifier: Diagnosis of  By: [name], [name]    Depression    Depression with anxiety    Prior suicide attempt(08/25/19-pt states not suicide attempt)   [place] DISEASE, LUMBAR 04/19/2010   Dyspnea    Dysrhythmia    ERECTILE DYSFUNCTION, ORGANIC 05/30/2010   Essential hypertension 06/24/2009   Qualifier: Diagnosis of  By: [name], [name]    Fibromyalgia    Fracture dislocation of ankle joint 09/02/2015   Gait disorder 03/12/2013   General weakness 07/14/2014   GERD (gastroesophageal reflux disease) 09/08/2015   08/25/15-pt states was cardiac origin, not GERD   Hand joint pain 06/10/2013   Heart murmur    Hepatitis C    treated pt. unknown with what he was a teenager   History of kidney stones    Hyperlipidemia 07/15/2009   Qualifier: Diagnosis of  By: Aundra Dubin, MD, [place]     HYPERLIPIDEMIA-MIXED 07/15/2009   08/25/19-pt state cholesterol was normal range   HYPERTENSION 06/24/2009   Insomnia 10/04/2011   Irregular heart beat    Left hip pain 03/12/2013   Injected under ultrasound guidance on June 24, 2013  Major depression 09/13/2015   Myocardial infarction [name] 2008   Non-cardiac chest pain 10/2011, 01/2013   Obesity    Occult blood positive stool 10/17/2014   Open ankle fracture 09/02/2015   OSA (obstructive sleep apnea)    not using CPAP (09/30/2013)   Pain of right thumb 04/03/2013   Pneumonia    PPD positive 04/08/2015   Pre-ulcerative corn or callous 02/06/2013   Rotator cuff tear arthropathy of both shoulders 06/10/2013   History of bilateral shoulder cuff surgery for rotator cuff tears. Reports increase in pain 09/11/2015 during physical therapy of the left shoulder.    SCIATICA, LEFT 04/19/2010   Qualifier: Diagnosis of  By: [name], [name]    Sleep apnea    wears cpap 08/25/19-States not using due to nasal stuffiness.   Spinal stenosis in cervical region 09/26/2013   Spinal stenosis, lumbar region, with  neurogenic claudication 09/26/2013   Type II diabetes mellitus ([place]) 2012   no meds in 09/2014.    Umbilical hernia    Uncontrolled type 2 DM with peripheral circulatory disorder 10/04/2013   08/25/19- pt states A1C normal, no diabetes   URETHRAL STRICTURE 06/24/2009   self catheterizes.    Past Surgical History:  Procedure Laterality Date   AMPUTATION Right 06/14/2016   Procedure: AMPUTATION BELOW KNEE;  Surgeon: Newt Minion, MD;  Location: [place];  Service: Orthopedics;  Laterality: Right;   ANKLE FUSION Right 04/15/2014   Procedure: Right Subtalar, Talonavicular Fusion;  Surgeon: Newt Minion, MD;  Location: [place];  Service: Orthopedics;  Laterality: Right;   ANKLE FUSION Right 04/18/2016   Procedure: Right Ankle Tibiocalcaneal Fusion;  Surgeon: Newt Minion, MD;  Location: [place];  Service: Orthopedics;  Laterality: Right;   ANTERIOR CERVICAL DECOMP/DISCECTOMY FUSION N/A 09/26/2013   Procedure: ANTERIOR CERVICAL DISCECTOMY FUSION C3-4, plate and screw fixation, allograft bone graft;  Surgeon: Jessy Oto, MD;  Location: [place];  Service: Orthopedics;  Laterality: N/A;   BACK SURGERY     3   BELOW KNEE LEG AMPUTATION Right 06/14/2016   right ankle and foot   CARDIAC CATHETERIZATION  X 1   CARPAL TUNNEL RELEASE Bilateral    COLONOSCOPY N/A 10/22/2014   Procedure: COLONOSCOPY;  Surgeon: Lafayette Dragon, MD;  Location: [facility] ENDOSCOPY;  Service: Endoscopy;  Laterality: N/A;   COLONOSCOPY  11/19/2018   CORONARY ANGIOPLASTY WITH STENT PLACEMENT     "I have 9 stents"   ESOPHAGOGASTRODUODENOSCOPY N/A 10/19/2014   Procedure: ESOPHAGOGASTRODUODENOSCOPY (EGD);  Surgeon: Jerene Bears, MD;  Location[facility] ENDOSCOPY;  Service: Endoscopy;  Laterality: N/A;   FRACTURE SURGERY     FUSION OF TALONAVICULAR JOINT Right 04/15/2014   dr duda   GASTRIC BYPASS  02/20/2019   HERNIA REPAIR     umbilical   INGUINAL HERNIA REPAIR Right 05/11/2015   Procedure: LAPAROSCOPIC REPAIR RIGHT  INGUINAL HERNIA;  Surgeon:  Greer Pickerel, MD;  Location: [place];  Service: General;  Laterality: Right;   INSERTION OF MESH Right 05/11/2015   Procedure: INSERTION OF MESH;  Surgeon: Greer Pickerel, MD;  Location: [place];  Service: General;  Laterality: Right;   JOINT REPLACEMENT     KNEE CARTILAGE SURGERY Right X 12   "~ 1/2 open; ~ 1/2 scopes"   KNEE CARTILAGE SURGERY Left X 3   "3 scopes"   LAPAROSCOPIC GASTRIC SLEEVE RESECTION N/A 03/03/2019   Procedure: LAPAROSCOPIC GASTRIC SLEEVE RESECTION, Upper Endo, ERAS Pathway;  Surgeon: Greer Pickerel, MD;  Location:  [facility];  Service: General;  Laterality: N/A;   LEFT HEART CATHETERIZATION WITH CORONARY ANGIOGRAM N/A 02/10/2013   Procedure: LEFT HEART CATHETERIZATION WITH CORONARY ANGIOGRAM;  Surgeon: Burnell Blanks, MD;  Location: [facility] CATH LAB;  Service: Cardiovascular;  Laterality: N/A;   LUMBAR LAMINECTOMY N/A 07/16/2018   Procedure: LEFT L4-5 REDO PARTIAL LUMBAR HEMILAMINECTOMY WITH FORAMINOTOMY LEFT L4;  Surgeon: Jessy Oto, MD;  Location: [place];  Service: Orthopedics;  Laterality: N/A;   LUMBAR LAMINECTOMY/DECOMPRESSION MICRODISCECTOMY N/A 01/27/2014   Procedure: CENTRAL LUMBAR LAMINECTOMY L4-5 AND L3-4;  Surgeon: Jessy Oto, MD;  Location: [place];  Service: Orthopedics;  Laterality: N/A;   ORIF ANKLE FRACTURE Right 09/02/2015   Procedure: OPEN REDUCTION INTERNAL FIXATION (ORIF) ANKLE FRACTURE;  Surgeon: Newt Minion, MD;  Location: [place];  Service: Orthopedics;  Laterality: Right;   PERIPHERALLY INSERTED CENTRAL CATHETER INSERTION  09/02/2015   POLYPECTOMY     ROTATOR CUFF REPAIR Left    x 2   ROTATOR CUFF REPAIR Right    x 3   SHOULDER ARTHROSCOPY W/ ROTATOR CUFF REPAIR Bilateral    "3 on the right; 1 on the left"   SKIN SPLIT GRAFT Right 10/01/2015   Procedure: RIGHT ANKLE APPLY SKIN GRAFT SPLIT THICKNESS;  Surgeon: Newt Minion, MD;  Location: [place];  Service: Orthopedics;  Laterality: Right;   TONSILLECTOMY     TOOTH EXTRACTION     TOTAL HIP ARTHROPLASTY  Right 04/17/2018   TOTAL HIP ARTHROPLASTY Right 04/17/2018   Procedure: RIGHT TOTAL HIP ARTHROPLASTY;  Surgeon: Newt Minion, MD;  Location: [place];  Service: Orthopedics;  Laterality: Right;   TOTAL HIP ARTHROPLASTY Left 06/06/2019   Procedure: LEFT TOTAL HIP ARTHROPLASTY ANTERIOR APPROACH;  Surgeon: Mcarthur Rossetti, MD;  Location: [place];  Service: Orthopedics;  Laterality: Left;   TOTAL HIP REVISION Left 05/24/2019   TOTAL KNEE ARTHROPLASTY Bilateral 1601   UMBILICAL HERNIA REPAIR     UHR   UPPER GASTROINTESTINAL ENDOSCOPY  2016   URETHRAL DILATION  X 4   VASECTOMY     WISDOM TOOTH EXTRACTION      reports that he quit smoking about 10 years ago. His smoking use included cigars. He has never used smokeless tobacco. He reports current alcohol use. He reports that he does not use drugs. family history includes Breast cancer in his mother; Colon polyps in his brother; Coronary artery disease in an other family member; Depression in his brother, mother, and another family member; Diabetes in his father; Early death in his maternal grandfather, paternal grandfather, and son; Esophageal cancer in his mother; Healthy in his son; Heart attack (age of onset: 21) in his son; Heart attack (age of onset: 58) in his maternal grandfather; Heart disease in his father, maternal grandfather, and mother; Hyperlipidemia in his father; Hypertension in his brother, father, mother, and another family member; Pancreatic cancer in his mother; Prostate cancer in his father; Skin cancer in his father; Stomach cancer in his mother. No Known Allergies Current Outpatient Medications on File Prior to Visit  Medication Sig Dispense Refill   albuterol (VENTOLIN HFA) 108 (90 Base) MCG/ACT inhaler Inhale 2 puffs into the lungs every 6 (six) hours as needed for wheezing or shortness of breath. 18 g 1   ARIPiprazole (ABILIFY) 10 MG tablet Take 1 tablet (10 mg total) by mouth daily. 90 tablet 0   aspirin EC 81 MG tablet  Take 81 mg by mouth at bedtime. Swallow whole.  atorvastatin (LIPITOR) 20 MG tablet Take 1 tablet (20 mg total) by mouth daily. 90 tablet 0   Azelastine-Fluticasone 137-50 MCG/ACT SUSP Use both nostrils as directed twice daily 23 g 11   benztropine (COGENTIN) 0.5 MG tablet Take 1 tablet (0.5 mg total) by mouth at bedtime. 90 tablet 0   budesonide-formoterol (SYMBICORT) 160-4.5 MCG/ACT inhaler Inhale 2 puffs into the lungs 2 (two) times daily. 3 each 3   buprenorphine (BUTRANS) 20 MCG/HR PTWK Place 1 patch onto the skin every Thursday.     calcium carbonate (TUMS - DOSED IN MG ELEMENTAL CALCIUM) 500 MG chewable tablet Chew 1 tablet by mouth 3 (three) times daily with meals.     celecoxib (CELEBREX) 200 MG capsule Take 1 capsule (200 mg total) by mouth 2 (two) times daily. 40 capsule 0   cetirizine (ZYRTEC) 10 MG tablet Take 1 tablet (10 mg total) by mouth daily. 30 tablet 11   Cholecalciferol (VITAMIN D3 PO) Take 2 tablets by mouth at bedtime.     diclofenac Sodium (VOLTAREN) 1 % GEL Apply 4 g topically 3 (three) times daily as needed (pain). 350 g 5   donepezil (ARICEPT) 5 MG tablet Take 1 tablet (5 mg total) by mouth at bedtime. 90 tablet 3   DULoxetine (CYMBALTA) 20 MG capsule Take 40 mg by mouth in the morning and at bedtime.     DULoxetine 40 MG CPEP Take 1 capsule (39m total) by mouth daily. 45 capsule 0   Ferrous Sulfate (IRON PO) Take by mouth.     fluticasone (FLONASE) 50 MCG/ACT nasal spray Place 2 sprays into both nostrils in the morning and at bedtime.     gabapentin (NEURONTIN) 800 MG tablet Take 800 mg by mouth 3 (three) times daily.     hydrochlorothiazide (HYDRODIURIL) 25 MG tablet Take 1 tablet (25 mg total) by mouth daily. 90 tablet 3   HYDROcodone-acetaminophen (NORCO) 10-325 MG tablet Take 1 tablet by mouth in the morning, at noon, and at bedtime.     losartan (COZAAR) 50 MG tablet Take 1 tablet (50 mg total) by mouth daily. 90 tablet 3   melatonin 5 MG TABS Take 15 mg by  mouth at bedtime.     methocarbamol (ROBAXIN) 500 MG tablet Take 1 tablet (500 mg total) by mouth every 8 (eight) hours as needed for muscle spasms. 90 tablet 1   MYRBETRIQ 50 MG TB24 tablet Take 50 mg by mouth at bedtime.     Omega-3 Fatty Acids (FISH OIL) 1000 MG CAPS Take 2,000 mg by mouth at bedtime.     Pancrelipase, Lip-Prot-Amyl, (ZENPEP) 40000-126000 units CPEP Take 2 capsules (80,000 units) with meals and 1 capsule (40,000 units) with each snack. PLEASE SCHEDULE A YEARLY FOLLOW UP FOR FURTHER REFILLS. Thank you 240 capsule 1   phentermine 37.5 MG capsule Take 1 capsule (37.5 mg total) by mouth every morning. 30 capsule 0   prasugrel (EFFIENT) 5 MG TABS tablet Take 1 tablet (5 mg total) by mouth daily. 90 tablet 3   predniSONE (DELTASONE) 10 MG tablet 3 tabs by mouth per day for 3 days,2tabs per day for 3 days,1tab per day for 3 days 18 tablet 0   tamsulosin (FLOMAX) 0.4 MG CAPS capsule Take 0.4 mg by mouth 2 (two) times daily.     testosterone (ANDROGEL) 50 MG/5GM (1%) GEL Place 5 g onto the skin daily. 150 g 5   Testosterone 1.62 % GEL Place 1 Pump onto the skin daily. 88 g 1  traZODone (DESYREL) 150 MG tablet Take 1 tablet (150 mg total) by mouth at bedtime. 90 tablet 0   zolpidem (AMBIEN) 10 MG tablet Take 2 tablets (20 mg total) by mouth at bedtime as needed for sleep. 180 tablet 1   No current facility-administered medications on file prior to visit.        ROS:  All others reviewed and negative.  Objective        PE:  BP 140/80 (BP Location: Left Arm, Patient Position: Sitting, Cuff Size: Large)   Pulse 88   Temp 98.4 F (36.9 C) (Oral)   Ht 6' 1"  (1.854 m)   Wt 299 lb (135.6 kg)   SpO2 96%   BMI 39.45 kg/m                 Constitutional: Pt appears in NAD               HENT: Head: NCAT.                Right Ear: External ear normal.                 Left Ear: External ear normal.                Eyes: . Pupils are equal, round, and reactive to light. Conjunctivae and  EOM are normal               Nose: without d/c or deformity               Neck: Neck supple. Gross normal ROM               Cardiovascular: Normal rate and regular rhythm.                 Pulmonary/Chest: Effort normal and breath sounds without rales or wheezing.                Abd:  Soft, NT, ND, + BS, no organomegaly               Neurological: Pt is alert. At baseline orientation, motor grossly intact               Skin: Skin is warm. No rashes, no other new lesions, LE edema - none, but has hypersensitive to touch bilateral upper lateral upper legs               Psychiatric: Pt behavior is normal without agitation   Micro: none  Cardiac tracings I have personally interpreted today:  none  Pertinent Radiological findings (summarize): none   Lab Results  Component Value Date   WBC 5.9 03/09/2021   HGB 14.5 03/09/2021   HCT 43.1 03/09/2021   PLT 259 03/09/2021   GLUCOSE 111 (H) 03/09/2021   CHOL 113 12/01/2020   TRIG 85.0 12/01/2020   HDL 42.30 12/01/2020   LDLCALC 54 12/01/2020   ALT 11 12/01/2020   AST 22 12/01/2020   NA 138 03/09/2021   K 3.9 03/09/2021   CL 106 03/09/2021   CREATININE 1.24 03/09/2021   BUN 23 03/09/2021   CO2 23 03/09/2021   TSH 2.52 02/24/2020   PSA 0.56 07/24/2019   INR 1.1 06/03/2019   HGBA1C 5.8 (H) 03/09/2021   MICROALBUR <0.7 07/24/2019   Assessment/Plan:  Travis Novak Sr. is a 70 y.o. White or Caucasian [1] male with  has a past medical history of ALLERGIC RHINITIS (06/24/2009), Allergy, Anemia, Anginal pain ([place]),  Anxiety (11/12/2011), Arthritis, Arthritis of foot, right, degenerative (04/15/2014), Balance disorder (03/12/2013), Benign neoplasm of cecum, Benign neoplasm of descending colon, CAD (coronary artery disease) (06/24/2009), Chronic anticoagulation, Chronic pain syndrome (10/27/2009), Closed fracture of right foot (10/17/2014), CORONARY ARTERY DISEASE (06/24/2009), DEGENERATIVE JOINT DISEASE (06/24/2009), Depression, Depression  with anxiety, DISC DISEASE, LUMBAR (04/19/2010), Dyspnea, Dysrhythmia, ERECTILE DYSFUNCTION, ORGANIC (05/30/2010), Essential hypertension (06/24/2009), Fibromyalgia, Fracture dislocation of ankle joint (09/02/2015), Gait disorder (03/12/2013), General weakness (07/14/2014), GERD (gastroesophageal reflux disease) (09/08/2015), Hand joint pain (06/10/2013), Heart murmur, Hepatitis C, History of kidney stones, Hyperlipidemia (07/15/2009), HYPERLIPIDEMIA-MIXED (07/15/2009), HYPERTENSION (06/24/2009), Insomnia (10/04/2011), Irregular heart beat, Left hip pain (03/12/2013), Major depression (09/13/2015), Myocardial infarction ([place]) (2008), Non-cardiac chest pain (10/2011, 01/2013), Obesity, Occult blood positive stool (10/17/2014), Open ankle fracture (09/02/2015), OSA (obstructive sleep apnea), Pain of right thumb (04/03/2013), Pneumonia, PPD positive (04/08/2015), Pre-ulcerative corn or callous (02/06/2013), Rotator cuff tear arthropathy of both shoulders (06/10/2013), SCIATICA, LEFT (04/19/2010), Sleep apnea, Spinal stenosis in cervical region (09/26/2013), Spinal stenosis, lumbar region, with neurogenic claudication (09/26/2013), Type II diabetes mellitus ([place]) (4496), Umbilical hernia, Uncontrolled type 2 DM with peripheral circulatory disorder (10/04/2013), and URETHRAL STRICTURE (06/24/2009).  Iliotibial band syndrome of both sides At least moderate, recent flare now persistent, for volt gel prn, also f/u sport medicine   SHOULDER PAIN, BILATERAL Ok for left reverse shoulder surgury tomorrow  Vitamin D deficiency Last vitamin D Lab Results  Component Value Date   VD25OH 53.07 12/01/2020   Stable, cont oral replacement   Essential hypertension BP Readings from Last 3 Encounters:  03/16/21 140/80  03/14/21 140/78  03/09/21 (!) 122/53   Stable, pt to continue medical treatment hct, losartan   Chronic pain syndrome Also cont to f/u pain management  Followup: Return if symptoms worsen or fail  to improve.  [name], MD 03/16/2021 9:36 PM [facility]

## 2021-03-16 NOTE — Assessment & Plan Note (Signed)
[place] for left reverse shoulder surgury tomorrow

## 2021-03-16 NOTE — Assessment & Plan Note (Signed)
BP Readings from Last 3 Encounters:  03/16/21 140/80  03/14/21 140/78  03/09/21 (!) 122/53   Stable, pt to continue medical treatment hct, losartan

## 2021-03-16 NOTE — Telephone Encounter (Signed)
No, but we will see the form to figure out where to go from there, thanks

## 2021-03-16 NOTE — Assessment & Plan Note (Signed)
Also cont to f/u pain management

## 2021-03-16 NOTE — Assessment & Plan Note (Signed)
Last vitamin D Lab Results  Component Value Date   VD25OH 53.07 12/01/2020   Stable, cont oral replacement

## 2021-03-17 ENCOUNTER — Inpatient Hospital Stay (HOSPITAL_COMMUNITY)
Admission: RE | Admit: 2021-03-17 | Discharge: 2021-03-20 | DRG: 483 | Disposition: A | Discharge status: Skilled Nursing Facility | Payer: Medicare Other | Source: Ambulatory Visit | Attending: Orthopedic Surgery | Admitting: Orthopedic Surgery

## 2021-03-17 ENCOUNTER — Ambulatory Visit (HOSPITAL_COMMUNITY): Payer: Medicare Other | Admitting: Certified Registered Nurse Anesthetist

## 2021-03-17 ENCOUNTER — Encounter (HOSPITAL_COMMUNITY)
Admission: RE | Disposition: A | Discharge status: Skilled Nursing Facility | Payer: Self-pay | Source: Ambulatory Visit | Attending: Orthopedic Surgery

## 2021-03-17 ENCOUNTER — Encounter (HOSPITAL_COMMUNITY): Payer: Self-pay | Admitting: Orthopedic Surgery

## 2021-03-17 ENCOUNTER — Other Ambulatory Visit: Payer: Self-pay

## 2021-03-17 ENCOUNTER — Ambulatory Visit (HOSPITAL_COMMUNITY): Payer: Medicare Other | Admitting: Vascular Surgery

## 2021-03-17 ENCOUNTER — Inpatient Hospital Stay (HOSPITAL_COMMUNITY): Payer: Medicare Other

## 2021-03-17 DIAGNOSIS — G8918 Other acute postprocedural pain: Secondary | ICD-10-CM | POA: Diagnosis not present

## 2021-03-17 DIAGNOSIS — Z9884 Bariatric surgery status: Secondary | ICD-10-CM | POA: Diagnosis not present

## 2021-03-17 DIAGNOSIS — Z89511 Acquired absence of right leg below knee: Secondary | ICD-10-CM | POA: Diagnosis not present

## 2021-03-17 DIAGNOSIS — Z96653 Presence of artificial knee joint, bilateral: Secondary | ICD-10-CM | POA: Diagnosis present

## 2021-03-17 DIAGNOSIS — I9589 Other hypotension: Secondary | ICD-10-CM | POA: Diagnosis not present

## 2021-03-17 DIAGNOSIS — Z96643 Presence of artificial hip joint, bilateral: Secondary | ICD-10-CM | POA: Diagnosis present

## 2021-03-17 DIAGNOSIS — I2510 Atherosclerotic heart disease of native coronary artery without angina pectoris: Secondary | ICD-10-CM | POA: Diagnosis not present

## 2021-03-17 DIAGNOSIS — I9788 Other intraoperative complications of the circulatory system, not elsewhere classified: Secondary | ICD-10-CM

## 2021-03-17 DIAGNOSIS — Z955 Presence of coronary angioplasty implant and graft: Secondary | ICD-10-CM

## 2021-03-17 DIAGNOSIS — Z981 Arthrodesis status: Secondary | ICD-10-CM

## 2021-03-17 DIAGNOSIS — T24212A Burn of second degree of left thigh, initial encounter: Secondary | ICD-10-CM | POA: Diagnosis not present

## 2021-03-17 DIAGNOSIS — Z743 Need for continuous supervision: Secondary | ICD-10-CM | POA: Diagnosis not present

## 2021-03-17 DIAGNOSIS — G4733 Obstructive sleep apnea (adult) (pediatric): Secondary | ICD-10-CM | POA: Diagnosis not present

## 2021-03-17 DIAGNOSIS — Z96642 Presence of left artificial hip joint: Secondary | ICD-10-CM | POA: Diagnosis not present

## 2021-03-17 DIAGNOSIS — E119 Type 2 diabetes mellitus without complications: Secondary | ICD-10-CM | POA: Diagnosis not present

## 2021-03-17 DIAGNOSIS — M19012 Primary osteoarthritis, left shoulder: Secondary | ICD-10-CM

## 2021-03-17 DIAGNOSIS — M4326 Fusion of spine, lumbar region: Secondary | ICD-10-CM | POA: Diagnosis not present

## 2021-03-17 DIAGNOSIS — Z20822 Contact with and (suspected) exposure to covid-19: Secondary | ICD-10-CM | POA: Diagnosis present

## 2021-03-17 DIAGNOSIS — J309 Allergic rhinitis, unspecified: Secondary | ICD-10-CM | POA: Diagnosis not present

## 2021-03-17 DIAGNOSIS — F419 Anxiety disorder, unspecified: Secondary | ICD-10-CM | POA: Diagnosis present

## 2021-03-17 DIAGNOSIS — Z87891 Personal history of nicotine dependence: Secondary | ICD-10-CM

## 2021-03-17 DIAGNOSIS — Z6841 Body Mass Index (BMI) 40.0 and over, adult: Secondary | ICD-10-CM | POA: Diagnosis not present

## 2021-03-17 DIAGNOSIS — Z471 Aftercare following joint replacement surgery: Secondary | ICD-10-CM | POA: Diagnosis not present

## 2021-03-17 DIAGNOSIS — Z9889 Other specified postprocedural states: Secondary | ICD-10-CM

## 2021-03-17 DIAGNOSIS — Z1159 Encounter for screening for other viral diseases: Secondary | ICD-10-CM | POA: Diagnosis not present

## 2021-03-17 DIAGNOSIS — F32A Depression, unspecified: Secondary | ICD-10-CM | POA: Diagnosis present

## 2021-03-17 DIAGNOSIS — R9431 Abnormal electrocardiogram [ECG] [EKG]: Secondary | ICD-10-CM | POA: Diagnosis not present

## 2021-03-17 DIAGNOSIS — T8131XA Disruption of external operation (surgical) wound, not elsewhere classified, initial encounter: Secondary | ICD-10-CM | POA: Diagnosis not present

## 2021-03-17 DIAGNOSIS — E782 Mixed hyperlipidemia: Secondary | ICD-10-CM | POA: Diagnosis not present

## 2021-03-17 DIAGNOSIS — G4700 Insomnia, unspecified: Secondary | ICD-10-CM | POA: Diagnosis not present

## 2021-03-17 DIAGNOSIS — Z96612 Presence of left artificial shoulder joint: Secondary | ICD-10-CM | POA: Diagnosis not present

## 2021-03-17 DIAGNOSIS — M4726 Other spondylosis with radiculopathy, lumbar region: Secondary | ICD-10-CM | POA: Diagnosis not present

## 2021-03-17 DIAGNOSIS — I252 Old myocardial infarction: Secondary | ICD-10-CM | POA: Diagnosis not present

## 2021-03-17 DIAGNOSIS — M6281 Muscle weakness (generalized): Secondary | ICD-10-CM | POA: Diagnosis not present

## 2021-03-17 DIAGNOSIS — I10 Essential (primary) hypertension: Secondary | ICD-10-CM | POA: Diagnosis present

## 2021-03-17 DIAGNOSIS — Z7902 Long term (current) use of antithrombotics/antiplatelets: Secondary | ICD-10-CM

## 2021-03-17 DIAGNOSIS — I441 Atrioventricular block, second degree: Secondary | ICD-10-CM | POA: Diagnosis not present

## 2021-03-17 DIAGNOSIS — Z01818 Encounter for other preprocedural examination: Secondary | ICD-10-CM

## 2021-03-17 DIAGNOSIS — M797 Fibromyalgia: Secondary | ICD-10-CM | POA: Diagnosis not present

## 2021-03-17 DIAGNOSIS — R001 Bradycardia, unspecified: Secondary | ICD-10-CM | POA: Diagnosis not present

## 2021-03-17 DIAGNOSIS — Z9989 Dependence on other enabling machines and devices: Secondary | ICD-10-CM | POA: Diagnosis not present

## 2021-03-17 DIAGNOSIS — I110 Hypertensive heart disease with heart failure: Secondary | ICD-10-CM | POA: Diagnosis not present

## 2021-03-17 DIAGNOSIS — G894 Chronic pain syndrome: Secondary | ICD-10-CM | POA: Diagnosis not present

## 2021-03-17 DIAGNOSIS — M48061 Spinal stenosis, lumbar region without neurogenic claudication: Secondary | ICD-10-CM | POA: Diagnosis not present

## 2021-03-17 DIAGNOSIS — R2689 Other abnormalities of gait and mobility: Secondary | ICD-10-CM | POA: Diagnosis not present

## 2021-03-17 DIAGNOSIS — M4802 Spinal stenosis, cervical region: Secondary | ICD-10-CM | POA: Diagnosis not present

## 2021-03-17 DIAGNOSIS — N35819 Other urethral stricture, male, unspecified site: Secondary | ICD-10-CM | POA: Diagnosis not present

## 2021-03-17 DIAGNOSIS — G8929 Other chronic pain: Secondary | ICD-10-CM | POA: Diagnosis not present

## 2021-03-17 DIAGNOSIS — M1612 Unilateral primary osteoarthritis, left hip: Secondary | ICD-10-CM | POA: Diagnosis not present

## 2021-03-17 HISTORY — PX: REVERSE SHOULDER ARTHROPLASTY: SHX5054

## 2021-03-17 LAB — POCT I-STAT 7, (LYTES, BLD GAS, ICA,H+H)
Acid-Base Excess: 1 mmol/L (ref 0.0–2.0)
Bicarbonate: 27.2 mmol/L (ref 20.0–28.0)
Calcium, Ion: 1.41 mmol/L — ABNORMAL HIGH (ref 1.15–1.40)
HCT: 36 % — ABNORMAL LOW (ref 39.0–52.0)
Hemoglobin: 12.2 g/dL — ABNORMAL LOW (ref 13.0–17.0)
O2 Saturation: 100 %
Potassium: 3.5 mmol/L (ref 3.5–5.1)
Sodium: 138 mmol/L (ref 135–145)
TCO2: 29 mmol/L (ref 22–32)
pCO2 arterial: 48.1 mmHg — ABNORMAL HIGH (ref 32.0–48.0)
pH, Arterial: 7.36 (ref 7.350–7.450)
pO2, Arterial: 218 mmHg — ABNORMAL HIGH (ref 83.0–108.0)

## 2021-03-17 LAB — SARS CORONAVIRUS 2 BY RT PCR (HOSPITAL ORDER, PERFORMED IN ~~LOC~~ HOSPITAL LAB): SARS Coronavirus 2: NEGATIVE

## 2021-03-17 LAB — GLUCOSE, CAPILLARY
Glucose-Capillary: 86 mg/dL (ref 70–99)
Glucose-Capillary: 100 mg/dL — ABNORMAL HIGH (ref 70–99)
Glucose-Capillary: 168 mg/dL — ABNORMAL HIGH (ref 70–99)

## 2021-03-17 SURGERY — ARTHROPLASTY, SHOULDER, TOTAL, REVERSE
Anesthesia: General | Site: Shoulder | Laterality: Left

## 2021-03-17 MED ORDER — MIDAZOLAM HCL 2 MG/2ML IJ SOLN
INTRAMUSCULAR | Status: AC
  Filled 2021-03-17: qty 2

## 2021-03-17 MED ORDER — DOCUSATE SODIUM 100 MG PO CAPS
100.0000 mg | ORAL_CAPSULE | Freq: Two times a day (BID) | ORAL | Status: DC
  Administered 2021-03-17 – 2021-03-20 (×6): 100 mg via ORAL
  Filled 2021-03-17 (×6): qty 1

## 2021-03-17 MED ORDER — ARIPIPRAZOLE 10 MG PO TABS
10.0000 mg | ORAL_TABLET | Freq: Every day | ORAL | Status: DC
  Administered 2021-03-17 – 2021-03-20 (×4): 10 mg via ORAL
  Filled 2021-03-17 (×4): qty 1

## 2021-03-17 MED ORDER — GLYCOPYRROLATE PF 0.2 MG/ML IJ SOSY
PREFILLED_SYRINGE | INTRAMUSCULAR | Status: DC | PRN
  Administered 2021-03-17: .2 mg via INTRAVENOUS

## 2021-03-17 MED ORDER — PANCRELIPASE (LIP-PROT-AMYL) 36000-114000 UNITS PO CPEP
72000.0000 [IU] | ORAL_CAPSULE | Freq: Two times a day (BID) | ORAL | Status: DC
  Administered 2021-03-18: 72000 [IU] via ORAL
  Filled 2021-03-17 (×2): qty 2

## 2021-03-17 MED ORDER — METOCLOPRAMIDE HCL 5 MG PO TABS: 5.0000 mg | ORAL_TABLET | Freq: Three times a day (TID) | ORAL | Status: DC | PRN

## 2021-03-17 MED ORDER — PROPOFOL 10 MG/ML IV BOLUS
INTRAVENOUS | Status: DC | PRN
  Administered 2021-03-17: 50 mg via INTRAVENOUS
  Administered 2021-03-17: 200 mg via INTRAVENOUS
  Administered 2021-03-17: 50 mg via INTRAVENOUS

## 2021-03-17 MED ORDER — METOCLOPRAMIDE HCL 5 MG/ML IJ SOLN: 5.0000 mg | Freq: Three times a day (TID) | INTRAMUSCULAR | Status: DC | PRN

## 2021-03-17 MED ORDER — HYDROCHLOROTHIAZIDE 25 MG PO TABS
25.0000 mg | ORAL_TABLET | Freq: Every day | ORAL | Status: DC
  Administered 2021-03-17 – 2021-03-20 (×4): 25 mg via ORAL
  Filled 2021-03-17 (×4): qty 1

## 2021-03-17 MED ORDER — ONDANSETRON HCL 4 MG/2ML IJ SOLN
INTRAMUSCULAR | Status: AC
  Filled 2021-03-17: qty 2

## 2021-03-17 MED ORDER — LIDOCAINE 2% (20 MG/ML) 5 ML SYRINGE
INTRAMUSCULAR | Status: DC | PRN
  Administered 2021-03-17: 100 mg via INTRAVENOUS

## 2021-03-17 MED ORDER — PHENYLEPHRINE HCL-NACL 20-0.9 MG/250ML-% IV SOLN
INTRAVENOUS | Status: DC | PRN
  Administered 2021-03-17: 50 ug/min via INTRAVENOUS
  Administered 2021-03-17: 120 ug/min via INTRAVENOUS

## 2021-03-17 MED ORDER — ONDANSETRON HCL 4 MG/2ML IJ SOLN
INTRAMUSCULAR | Status: DC | PRN
  Administered 2021-03-17: 4 mg via INTRAVENOUS

## 2021-03-17 MED ORDER — HYDROCODONE-ACETAMINOPHEN 5-325 MG PO TABS
1.0000 | ORAL_TABLET | ORAL | Status: DC | PRN
  Administered 2021-03-17 – 2021-03-18 (×3): 2 via ORAL
  Filled 2021-03-17 (×3): qty 2

## 2021-03-17 MED ORDER — PANCRELIPASE (LIP-PROT-AMYL) 36000-114000 UNITS PO CPEP
72000.0000 [IU] | ORAL_CAPSULE | Freq: Once | ORAL | Status: AC
  Administered 2021-03-18: 72000 [IU] via ORAL
  Filled 2021-03-17: qty 2

## 2021-03-17 MED ORDER — FENTANYL CITRATE (PF) 100 MCG/2ML IJ SOLN
INTRAMUSCULAR | Status: AC
  Filled 2021-03-17: qty 2

## 2021-03-17 MED ORDER — CEFAZOLIN SODIUM-DEXTROSE 2-4 GM/100ML-% IV SOLN
2.0000 g | Freq: Three times a day (TID) | INTRAVENOUS | Status: AC
Start: 2021-03-17 — End: 2021-03-18
  Administered 2021-03-17 – 2021-03-18 (×3): 2 g via INTRAVENOUS
  Filled 2021-03-17 (×3): qty 100

## 2021-03-17 MED ORDER — 0.9 % SODIUM CHLORIDE (POUR BTL) OPTIME
TOPICAL | Status: DC | PRN
  Administered 2021-03-17: 1000 mL

## 2021-03-17 MED ORDER — METHOCARBAMOL 500 MG PO TABS
500.0000 mg | ORAL_TABLET | Freq: Four times a day (QID) | ORAL | Status: DC | PRN
  Administered 2021-03-18 – 2021-03-20 (×3): 500 mg via ORAL
  Filled 2021-03-17 (×3): qty 1

## 2021-03-17 MED ORDER — ATROPINE SULFATE 0.4 MG/ML IV SOLN
INTRAVENOUS | Status: AC
  Filled 2021-03-17: qty 1

## 2021-03-17 MED ORDER — CEFAZOLIN IN SODIUM CHLORIDE 3-0.9 GM/100ML-% IV SOLN
3.0000 g | INTRAVENOUS | Status: AC
  Administered 2021-03-17: 3 g via INTRAVENOUS
  Filled 2021-03-17: qty 100

## 2021-03-17 MED ORDER — POVIDONE-IODINE 7.5 % EX SOLN
Freq: Once | CUTANEOUS | Status: DC
  Filled 2021-03-17: qty 118

## 2021-03-17 MED ORDER — EPHEDRINE SULFATE-NACL 50-0.9 MG/10ML-% IV SOSY
PREFILLED_SYRINGE | INTRAVENOUS | Status: DC | PRN
  Administered 2021-03-17: 10 mg via INTRAVENOUS
  Administered 2021-03-17 (×3): 5 mg via INTRAVENOUS
  Administered 2021-03-17: 10 mg via INTRAVENOUS
  Administered 2021-03-17: 5 mg via INTRAVENOUS
  Administered 2021-03-17: 10 mg via INTRAVENOUS
  Administered 2021-03-17: 5 mg via INTRAVENOUS
  Administered 2021-03-17: 10 mg via INTRAVENOUS
  Administered 2021-03-17: 5 mg via INTRAVENOUS

## 2021-03-17 MED ORDER — PHENOL 1.4 % MT LIQD: 1.0000 | OROMUCOSAL | Status: DC | PRN

## 2021-03-17 MED ORDER — VANCOMYCIN HCL 1000 MG IV SOLR
INTRAVENOUS | Status: DC | PRN
  Administered 2021-03-17: 1000 mg via TOPICAL

## 2021-03-17 MED ORDER — MORPHINE SULFATE (PF) 2 MG/ML IV SOLN: 0.5000 mg | INTRAVENOUS | Status: DC | PRN

## 2021-03-17 MED ORDER — PHENYLEPHRINE 40 MCG/ML (10ML) SYRINGE FOR IV PUSH (FOR BLOOD PRESSURE SUPPORT)
PREFILLED_SYRINGE | INTRAVENOUS | Status: AC
  Filled 2021-03-17: qty 10

## 2021-03-17 MED ORDER — CALCIUM CHLORIDE 10 % IV SOLN
INTRAVENOUS | Status: DC | PRN
  Administered 2021-03-17: 100 mg via INTRAVENOUS
  Administered 2021-03-17 (×2): 200 mg via INTRAVENOUS
  Administered 2021-03-17 (×2): 100 mg via INTRAVENOUS
  Administered 2021-03-17: 300 mg via INTRAVENOUS

## 2021-03-17 MED ORDER — LOSARTAN POTASSIUM 50 MG PO TABS
50.0000 mg | ORAL_TABLET | Freq: Every day | ORAL | Status: DC
  Administered 2021-03-18 – 2021-03-20 (×3): 50 mg via ORAL
  Filled 2021-03-17 (×3): qty 1

## 2021-03-17 MED ORDER — PHENYLEPHRINE HCL (PRESSORS) 10 MG/ML IV SOLN
INTRAVENOUS | Status: DC | PRN
  Administered 2021-03-17: 120 ug via INTRAVENOUS
  Administered 2021-03-17: 80 ug via INTRAVENOUS

## 2021-03-17 MED ORDER — MIRABEGRON ER 50 MG PO TB24
50.0000 mg | ORAL_TABLET | Freq: Every day | ORAL | Status: DC
  Administered 2021-03-17 – 2021-03-19 (×3): 50 mg via ORAL
  Filled 2021-03-17 (×4): qty 1

## 2021-03-17 MED ORDER — POVIDONE-IODINE 10 % EX SWAB
2.0000 "application " | Freq: Once | CUTANEOUS | Status: AC
  Administered 2021-03-17: 2 via TOPICAL

## 2021-03-17 MED ORDER — BUPIVACAINE LIPOSOME 1.3 % IJ SUSP
INTRAMUSCULAR | Status: DC | PRN
  Administered 2021-03-17: 10 mL via PERINEURAL

## 2021-03-17 MED ORDER — DROPERIDOL 2.5 MG/ML IJ SOLN: 0.6250 mg | Freq: Once | INTRAMUSCULAR | Status: DC | PRN

## 2021-03-17 MED ORDER — ATROPINE SULFATE 0.4 MG/ML IV SOLN
INTRAVENOUS | Status: DC | PRN
  Administered 2021-03-17: .4 mg via INTRAVENOUS

## 2021-03-17 MED ORDER — ALBUTEROL SULFATE (2.5 MG/3ML) 0.083% IN NEBU: 2.5000 mg | INHALATION_SOLUTION | Freq: Four times a day (QID) | RESPIRATORY_TRACT | Status: DC | PRN

## 2021-03-17 MED ORDER — EPHEDRINE 5 MG/ML INJ
INTRAVENOUS | Status: AC
  Filled 2021-03-17: qty 15

## 2021-03-17 MED ORDER — FENTANYL CITRATE (PF) 250 MCG/5ML IJ SOLN
INTRAMUSCULAR | Status: AC
  Filled 2021-03-17: qty 5

## 2021-03-17 MED ORDER — ASPIRIN EC 81 MG PO TBEC
81.0000 mg | DELAYED_RELEASE_TABLET | Freq: Every day | ORAL | Status: DC
  Administered 2021-03-17 – 2021-03-20 (×4): 81 mg via ORAL
  Filled 2021-03-17 (×4): qty 1

## 2021-03-17 MED ORDER — PRASUGREL HCL 5 MG PO TABS
5.0000 mg | ORAL_TABLET | Freq: Every day | ORAL | Status: DC
  Administered 2021-03-17 – 2021-03-20 (×4): 5 mg via ORAL
  Filled 2021-03-17 (×4): qty 1

## 2021-03-17 MED ORDER — MEPERIDINE HCL 25 MG/ML IJ SOLN: 6.2500 mg | INTRAMUSCULAR | Status: DC | PRN

## 2021-03-17 MED ORDER — SUGAMMADEX SODIUM 200 MG/2ML IV SOLN
INTRAVENOUS | Status: DC | PRN
  Administered 2021-03-17: 300 mg via INTRAVENOUS

## 2021-03-17 MED ORDER — DONEPEZIL HCL 5 MG PO TABS
5.0000 mg | ORAL_TABLET | Freq: Every day | ORAL | Status: DC
  Administered 2021-03-17 – 2021-03-19 (×3): 5 mg via ORAL
  Filled 2021-03-17 (×3): qty 1

## 2021-03-17 MED ORDER — VANCOMYCIN HCL 1000 MG IV SOLR
INTRAVENOUS | Status: AC
  Filled 2021-03-17: qty 20

## 2021-03-17 MED ORDER — ACETAMINOPHEN 325 MG PO TABS: 325.0000 mg | ORAL_TABLET | Freq: Four times a day (QID) | ORAL | Status: DC | PRN

## 2021-03-17 MED ORDER — ROCURONIUM BROMIDE 10 MG/ML (PF) SYRINGE
PREFILLED_SYRINGE | INTRAVENOUS | Status: AC
  Filled 2021-03-17: qty 10

## 2021-03-17 MED ORDER — TRANEXAMIC ACID-NACL 1000-0.7 MG/100ML-% IV SOLN
1000.0000 mg | INTRAVENOUS | Status: AC
  Administered 2021-03-17: 1000 mg via INTRAVENOUS
  Filled 2021-03-17: qty 100

## 2021-03-17 MED ORDER — ACETAMINOPHEN 500 MG PO TABS
500.0000 mg | ORAL_TABLET | Freq: Four times a day (QID) | ORAL | Status: AC
  Administered 2021-03-17 – 2021-03-18 (×2): 500 mg via ORAL
  Filled 2021-03-17 (×2): qty 1

## 2021-03-17 MED ORDER — IRRISEPT - 450ML BOTTLE WITH 0.05% CHG IN STERILE WATER, USP 99.95% OPTIME
TOPICAL | Status: DC | PRN
  Administered 2021-03-17: 900 mL

## 2021-03-17 MED ORDER — VASOPRESSIN 20 UNIT/ML IV SOLN
INTRAVENOUS | Status: DC | PRN
  Administered 2021-03-17: 1 [IU] via INTRAVENOUS
  Administered 2021-03-17: 2 [IU] via INTRAVENOUS
  Administered 2021-03-17 (×2): 1 [IU] via INTRAVENOUS
  Administered 2021-03-17 (×4): 2 [IU] via INTRAVENOUS
  Administered 2021-03-17 (×2): 1 [IU] via INTRAVENOUS
  Administered 2021-03-17: 2 [IU] via INTRAVENOUS

## 2021-03-17 MED ORDER — DEXAMETHASONE SODIUM PHOSPHATE 10 MG/ML IJ SOLN
INTRAMUSCULAR | Status: DC | PRN
  Administered 2021-03-17: 4 mg via INTRAVENOUS

## 2021-03-17 MED ORDER — PROPOFOL 10 MG/ML IV BOLUS
INTRAVENOUS | Status: AC
  Filled 2021-03-17: qty 20

## 2021-03-17 MED ORDER — ALBUMIN HUMAN 5 % IV SOLN: INTRAVENOUS | Status: DC | PRN

## 2021-03-17 MED ORDER — MOMETASONE FURO-FORMOTEROL FUM 200-5 MCG/ACT IN AERO
2.0000 | INHALATION_SPRAY | Freq: Two times a day (BID) | RESPIRATORY_TRACT | Status: DC
Start: 2021-03-18 — End: 2021-03-20
  Administered 2021-03-18 – 2021-03-20 (×5): 2 via RESPIRATORY_TRACT
  Filled 2021-03-17: qty 8.8

## 2021-03-17 MED ORDER — GABAPENTIN 400 MG PO CAPS
800.0000 mg | ORAL_CAPSULE | Freq: Three times a day (TID) | ORAL | Status: DC
  Administered 2021-03-17 – 2021-03-20 (×8): 800 mg via ORAL
  Filled 2021-03-17 (×8): qty 2

## 2021-03-17 MED ORDER — ONDANSETRON HCL 4 MG PO TABS: 4.0000 mg | ORAL_TABLET | Freq: Four times a day (QID) | ORAL | Status: DC | PRN

## 2021-03-17 MED ORDER — LACTATED RINGERS IV SOLN: INTRAVENOUS | Status: DC

## 2021-03-17 MED ORDER — MIDAZOLAM HCL 5 MG/5ML IJ SOLN
INTRAMUSCULAR | Status: DC | PRN
  Administered 2021-03-17: 1 mg via INTRAVENOUS
  Administered 2021-03-17: 2 mg via INTRAVENOUS

## 2021-03-17 MED ORDER — CHLORHEXIDINE GLUCONATE 0.12 % MT SOLN
15.0000 mL | Freq: Once | OROMUCOSAL | Status: AC
  Administered 2021-03-17: 15 mL via OROMUCOSAL
  Filled 2021-03-17: qty 15

## 2021-03-17 MED ORDER — DEXAMETHASONE SODIUM PHOSPHATE 10 MG/ML IJ SOLN
INTRAMUSCULAR | Status: AC
  Filled 2021-03-17: qty 1

## 2021-03-17 MED ORDER — ONDANSETRON HCL 4 MG/2ML IJ SOLN: 4.0000 mg | Freq: Four times a day (QID) | INTRAMUSCULAR | Status: DC | PRN

## 2021-03-17 MED ORDER — FENTANYL CITRATE (PF) 100 MCG/2ML IJ SOLN
25.0000 ug | INTRAMUSCULAR | Status: DC | PRN
  Administered 2021-03-17: 25 ug via INTRAVENOUS

## 2021-03-17 MED ORDER — DULOXETINE HCL 20 MG PO CPEP
40.0000 mg | ORAL_CAPSULE | Freq: Two times a day (BID) | ORAL | Status: DC
  Administered 2021-03-17 – 2021-03-20 (×6): 40 mg via ORAL
  Filled 2021-03-17 (×7): qty 2

## 2021-03-17 MED ORDER — LIDOCAINE 2% (20 MG/ML) 5 ML SYRINGE
INTRAMUSCULAR | Status: AC
  Filled 2021-03-17: qty 5

## 2021-03-17 MED ORDER — ROCURONIUM BROMIDE 10 MG/ML (PF) SYRINGE
PREFILLED_SYRINGE | INTRAVENOUS | Status: DC | PRN
  Administered 2021-03-17: 60 mg via INTRAVENOUS

## 2021-03-17 MED ORDER — METHOCARBAMOL 1000 MG/10ML IJ SOLN
500.0000 mg | Freq: Four times a day (QID) | INTRAVENOUS | Status: DC | PRN
  Filled 2021-03-17: qty 5

## 2021-03-17 MED ORDER — MENTHOL 3 MG MT LOZG: 1.0000 | LOZENGE | OROMUCOSAL | Status: DC | PRN

## 2021-03-17 MED ORDER — FENTANYL CITRATE (PF) 250 MCG/5ML IJ SOLN
INTRAMUSCULAR | Status: DC | PRN
  Administered 2021-03-17 (×3): 50 ug via INTRAVENOUS

## 2021-03-17 MED ORDER — ORAL CARE MOUTH RINSE: 15.0000 mL | Freq: Once | OROMUCOSAL | Status: AC

## 2021-03-17 MED ORDER — BUPIVACAINE HCL (PF) 0.5 % IJ SOLN
INTRAMUSCULAR | Status: DC | PRN
  Administered 2021-03-17: 10 mL via PERINEURAL

## 2021-03-17 SURGICAL SUPPLY — 92 items
AID PSTN UNV HD RSTRNT DISP (MISCELLANEOUS) ×1
ALCOHOL 70% 16 OZ (MISCELLANEOUS) ×3 IMPLANT
APL PRP STRL LF DISP 70% ISPRP (MISCELLANEOUS) ×1
BAG COUNTER SPONGE SURGICOUNT (BAG) ×2 IMPLANT
BAG SPNG CNTER NS LX DISP (BAG) ×1
BAG SURGICOUNT SPONGE COUNTING (BAG) ×1
BASEPLATE AUG MED W-TAPER (Plate) ×2 IMPLANT
BEARING HUMERAL 40 STD VITE (Joint) ×2 IMPLANT
BIT DRILL 2.7 W/STOP DISP (BIT) ×1 IMPLANT
BIT DRILL 2.7MM W/STOP DISP (BIT) ×1
BIT DRILL TWIST 2.7 (BIT) ×1 IMPLANT
BIT DRILL TWIST 2.7MM (BIT) ×1
BLADE SAW SGTL 13X75X1.27 (BLADE) ×3 IMPLANT
BRNG HUM STD 40 RVRS SHLDR (Joint) ×1 IMPLANT
BSPLAT GLND MED AUG TPR ADPR (Plate) ×1 IMPLANT
CHLORAPREP W/TINT 26 (MISCELLANEOUS) ×3 IMPLANT
CLOSURE WOUND 1/2 X4 (GAUZE/BANDAGES/DRESSINGS) ×1
COOLER ICEMAN CLASSIC (MISCELLANEOUS) ×3 IMPLANT
COVER SURGICAL LIGHT HANDLE (MISCELLANEOUS) ×3 IMPLANT
DRAPE INCISE IOBAN 66X45 STRL (DRAPES) ×5 IMPLANT
DRAPE U-SHAPE 47X51 STRL (DRAPES) ×6 IMPLANT
DRSG AQUACEL AG ADV 3.5X10 (GAUZE/BANDAGES/DRESSINGS) ×3 IMPLANT
ELECT BLADE 4.0 EZ CLEAN MEGAD (MISCELLANEOUS) ×3
ELECT REM PT RETURN 9FT ADLT (ELECTROSURGICAL) ×3
ELECTRODE BLDE 4.0 EZ CLN MEGD (MISCELLANEOUS) ×1 IMPLANT
ELECTRODE REM PT RTRN 9FT ADLT (ELECTROSURGICAL) ×1 IMPLANT
GAUZE SPONGE 4X4 12PLY STRL LF (GAUZE/BANDAGES/DRESSINGS) ×3 IMPLANT
GLENOSPHERE VERSADIAL 40 +3 (Joint) ×2 IMPLANT
GLOVE SRG 8 PF TXTR STRL LF DI (GLOVE) ×1 IMPLANT
GLOVE SURG LTX SZ7 (GLOVE) ×3 IMPLANT
GLOVE SURG LTX SZ8 (GLOVE) ×3 IMPLANT
GLOVE SURG UNDER POLY LF SZ7 (GLOVE) ×3 IMPLANT
GLOVE SURG UNDER POLY LF SZ8 (GLOVE) ×3
GOWN STRL REUS W/ TWL LRG LVL3 (GOWN DISPOSABLE) ×2 IMPLANT
GOWN STRL REUS W/ TWL XL LVL3 (GOWN DISPOSABLE) IMPLANT
GOWN STRL REUS W/TWL LRG LVL3 (GOWN DISPOSABLE) ×6
GOWN STRL REUS W/TWL XL LVL3 (GOWN DISPOSABLE)
GUIDE MODEL REV SHLD LT (ORTHOPEDIC DISPOSABLE SUPPLIES) ×2 IMPLANT
HYDROGEN PEROXIDE 16OZ (MISCELLANEOUS) ×3 IMPLANT
JET LAVAGE IRRISEPT WOUND (IRRIGATION / IRRIGATOR) ×6
KIT BASIN OR (CUSTOM PROCEDURE TRAY) ×3 IMPLANT
KIT TURNOVER KIT B (KITS) ×3 IMPLANT
LAVAGE JET IRRISEPT WOUND (IRRIGATION / IRRIGATOR) ×1 IMPLANT
LOOP VESSEL MAXI BLUE (MISCELLANEOUS) ×3 IMPLANT
MANIFOLD NEPTUNE II (INSTRUMENTS) ×3 IMPLANT
NDL SUT 6 .5 CRC .975X.05 MAYO (NEEDLE) IMPLANT
NDL TAPERED W/ NITINOL LOOP (MISCELLANEOUS) ×1 IMPLANT
NEEDLE MAYO TAPER (NEEDLE)
NEEDLE TAPERED W/ NITINOL LOOP (MISCELLANEOUS) ×3 IMPLANT
NS IRRIG 1000ML POUR BTL (IV SOLUTION) ×3 IMPLANT
PACK SHOULDER (CUSTOM PROCEDURE TRAY) ×3 IMPLANT
PAD ARMBOARD 7.5X6 YLW CONV (MISCELLANEOUS) ×6 IMPLANT
PAD COLD SHLDR WRAP-ON (PAD) ×3 IMPLANT
PASSER SUT SWANSON 36MM LOOP (INSTRUMENTS) ×3 IMPLANT
PIN HUMERAL STMN 3.2MMX9IN (INSTRUMENTS) ×2 IMPLANT
PIN STEINMANN THREADED TIP (PIN) ×2 IMPLANT
PIN THREADED REVERSE (PIN) ×2 IMPLANT
REAMER GUIDE BUSHING SURG DISP (MISCELLANEOUS) ×2 IMPLANT
REAMER GUIDE W/SCREW AUG (MISCELLANEOUS) ×2 IMPLANT
RESTRAINT HEAD UNIVERSAL NS (MISCELLANEOUS) ×3 IMPLANT
RETRIEVER SUT HEWSON (MISCELLANEOUS) ×2 IMPLANT
SCREW BONE CORT 6.5X35MM (Screw) ×2 IMPLANT
SCREW BONE LOCKING 4.75X30X3.5 (Screw) ×2 IMPLANT
SCREW BONE LOCKING 4.75X40X3.5 (Screw) ×2 IMPLANT
SCREW LOCKING 4.75MMX15MM (Screw) ×2 IMPLANT
SCREW LOCKING NS 4.75MMX20MM (Screw) ×4 IMPLANT
SLING ARM IMMOBILIZER LRG (SOFTGOODS) ×3 IMPLANT
SOL PREP POV-IOD 4OZ 10% (MISCELLANEOUS) ×3 IMPLANT
SPONGE T-LAP 18X18 ~~LOC~~+RFID (SPONGE) ×3 IMPLANT
STEM MINI LONG 15MM 83MM (Stem) ×2 IMPLANT
STRIP CLOSURE SKIN 1/2X4 (GAUZE/BANDAGES/DRESSINGS) ×2 IMPLANT
SUCTION FRAZIER HANDLE 10FR (MISCELLANEOUS) ×3
SUCTION TUBE FRAZIER 10FR DISP (MISCELLANEOUS) ×1 IMPLANT
SUT BROADBAND TAPE 2PK 1.5 (SUTURE) ×6 IMPLANT
SUT FIBERWIRE #2 38 T-5 BLUE (SUTURE)
SUT MAXBRAID (SUTURE) IMPLANT
SUT MNCRL AB 3-0 PS2 18 (SUTURE) ×3 IMPLANT
SUT SILK 2 0 TIES 10X30 (SUTURE) ×3 IMPLANT
SUT VIC AB 0 CT1 27 (SUTURE) ×15
SUT VIC AB 0 CT1 27XBRD ANBCTR (SUTURE) ×4 IMPLANT
SUT VIC AB 1 CT1 27 (SUTURE) ×9
SUT VIC AB 1 CT1 27XBRD ANBCTR (SUTURE) ×2 IMPLANT
SUT VIC AB 2-0 CT1 27 (SUTURE) ×12
SUT VIC AB 2-0 CT1 TAPERPNT 27 (SUTURE) ×3 IMPLANT
SUT VIC AB 2-0 UR6 27 (SUTURE) ×6 IMPLANT
SUT VICRYL 0 UR6 27IN ABS (SUTURE) ×10 IMPLANT
SUTURE FIBERWR #2 38 T-5 BLUE (SUTURE) IMPLANT
TOWEL GREEN STERILE (TOWEL DISPOSABLE) ×3 IMPLANT
TRAY FOL W/BAG SLVR 16FR STRL (SET/KITS/TRAYS/PACK) IMPLANT
TRAY FOLEY W/BAG SLVR 16FR LF (SET/KITS/TRAYS/PACK)
TRAY HUM REV SHOULDER +3 +5 (Shoulder) ×2 IMPLANT
WATER STERILE IRR 1000ML POUR (IV SOLUTION) ×3 IMPLANT

## 2021-03-17 NOTE — Transfer of Care (Signed)
Immediate Anesthesia Transfer of Care Note  Patient: Travis MONDOR Sr.  Procedure(s) Performed: LEFT REVERSE SHOULDER ARTHROPLASTY (Left: Shoulder)  Patient Location: PACU  Anesthesia Type:General  Level of Consciousness: awake and patient cooperative  Airway & Oxygen Therapy: Patient Spontanous Breathing and Patient connected to face mask oxygen  Post-op Assessment: Report given to RN and Post -op Vital signs reviewed and stable  Post vital signs: Reviewed and stable  Last Vitals:  Vitals Value Taken Time  BP 156/62 03/17/21 1647  Temp 36.9 C 03/17/21 1647  Pulse 91 03/17/21 1651  Resp 20 03/17/21 1651  SpO2 98 % 03/17/21 1651  Vitals shown include unvalidated device data.  Last Pain:  Vitals:   03/17/21 1647  TempSrc:   PainSc: 0-No pain         Complications: No notable events documented.

## 2021-03-17 NOTE — Progress Notes (Deleted)
Pt has telemetry orders, but there are no tele boxes on the unit. Charge nurse is calling around to try and find one.

## 2021-03-17 NOTE — Anesthesia Procedure Notes (Signed)
Procedure Name: Intubation Date/Time: 03/17/2021 12:40 PM Performed by: Renato Shin, CRNA Pre-anesthesia Checklist: Patient identified, Emergency Drugs available, Suction available and Patient being monitored Patient Re-evaluated:Patient Re-evaluated prior to induction Oxygen Delivery Method: Circle system utilized Preoxygenation: Pre-oxygenation with 100% oxygen Induction Type: IV induction Ventilation: Mask ventilation without difficulty Laryngoscope Size: Miller and 3 Grade View: Grade I Tube type: Oral Tube size: 7.5 mm Number of attempts: 1 Airway Equipment and Method: Stylet and Oral airway Placement Confirmation: ETT inserted through vocal cords under direct vision, positive ETCO2 and breath sounds checked- equal and bilateral Secured at: 21 cm Tube secured with: Tape Dental Injury: Teeth and Oropharynx as per pre-operative assessment

## 2021-03-17 NOTE — Progress Notes (Signed)
I&O cath performed in PACU using patient's home I&O cath kit. No urine returned with I&O cath. Patient bladder scanned-only showed 79 mL. Lurena Joiner, [place] made aware. Will continue to monitor.

## 2021-03-17 NOTE — Consult Note (Signed)
CONSULTATION NOTE   Patient Name: DAYVON DAX Sr. Date of Encounter: 03/17/2021 Cardiologist: Lauree Chandler, MD Electrophysiologist: None Advanced Heart Failure: None   Chief Complaint   Intraoperative hypotension  Patient Profile   70 yo male with history of CAD and prior PCI, underwent left shoulder surgery today and had intraoperative hypotension  HPI   Ireoluwa Grant. is a 70 y.o. male who is being seen today for the evaluation of hypotension at the request of Dr. Marlou Sa. This is a pleasant 70 year old male patient of Dr. Angelena Form, who was recently seen by Dr. Irish Lack for preoperative clearance.  He has a history of coronary artery disease and multiple prior PCI's, depression, anxiety, hypertension and spinal stenosis.  He last underwent stress testing in November 2019 with a stress test that showed no ischemia and EF 47%.  Echo in August 2019 showed normal LVEF 55 to 60%.  Apparently during surgery today he developed intraoperative hypotension.  I reviewed the anesthesia flowsheets.  He had some initial hypotension which may be related to induction and then about 30 to 45 minutes into the surgery developed arterial hypotension and was given a number of medications including ephedrine and vasopressin.  The blood pressure was corrected rather quickly and able to be managed throughout the remainder of the procedure.  He was able to be extubated and recovered with improvement in blood pressure and no longer is requiring any blood pressure raising medications.  At this time he is completely asymptomatic.  Denies any chest pain or shortness of breath.  He said he had no recollection of being told that he had any hypotension related to the procedure.  PMHx   Past Medical History:  Diagnosis Date   ALLERGIC RHINITIS 06/24/2009   Allergy    Anemia    IDA   Anginal pain ([place])    Anxiety 11/12/2011   Adequate for discharge    Arthritis    "all my joints"  (09/30/2013)   Arthritis of foot, right, degenerative 04/15/2014   Balance disorder 03/12/2013   Benign neoplasm of cecum    Benign neoplasm of descending colon    CAD (coronary artery disease) 06/24/2009   5 stents placed in 2007     Chronic anticoagulation    Chronic pain syndrome 10/27/2009   of ankle, shoulders, low back.  sciatica.    Closed fracture of right foot 10/17/2014   CORONARY ARTERY DISEASE 06/24/2009   a. s/p multiple PCIs - In 2008 he had a Taxus DES to the mild LAD, Endeavor DES to mid LCX and distal LCX. In January 2009 he had DES to distal LCX, mid LCX and proximal LCX. In November 2009 had BMS x 2 to the mid RCA. Cath 10/2011 with patent stents, noncardiac CP. LHC 01/2013: patent stents (noncardiac CP).   DEGENERATIVE JOINT DISEASE 06/24/2009   Qualifier: Diagnosis of  By: [name], [name]    Depression    Depression with anxiety    Prior suicide attempt(08/25/19-pt states not suicide attempt)   [place] DISEASE, LUMBAR 04/19/2010   Dyspnea    Dysrhythmia    ERECTILE DYSFUNCTION, ORGANIC 05/30/2010   Essential hypertension 06/24/2009   Qualifier: Diagnosis of  By: [name], [name]    Fibromyalgia    Fracture dislocation of ankle joint 09/02/2015   Gait disorder 03/12/2013   General weakness 07/14/2014   GERD (gastroesophageal reflux disease) 09/08/2015   08/25/15-pt states was cardiac origin, not GERD   Hand joint pain 06/10/2013  Heart murmur    Hepatitis C    treated pt. unknown with what he was a teenager   History of kidney stones    Hyperlipidemia 07/15/2009   Qualifier: Diagnosis of  By: Aundra Dubin, MD, [place]     HYPERLIPIDEMIA-MIXED 07/15/2009   08/25/19-pt state cholesterol was normal range   HYPERTENSION 06/24/2009   Insomnia 10/04/2011   Irregular heart beat    Left hip pain 03/12/2013   Injected under ultrasound guidance on June 24, 2013    Major depression 09/13/2015   Myocardial infarction [facility]) 2008   Non-cardiac chest pain 10/2011, 01/2013    Obesity    Occult blood positive stool 10/17/2014   Open ankle fracture 09/02/2015   OSA (obstructive sleep apnea)    not using CPAP (09/30/2013)   Pain of right thumb 04/03/2013   Pneumonia    PPD positive 04/08/2015   Pre-ulcerative corn or callous 02/06/2013   Rotator cuff tear arthropathy of both shoulders 06/10/2013   History of bilateral shoulder cuff surgery for rotator cuff tears. Reports increase in pain 09/11/2015 during physical therapy of the left shoulder.    SCIATICA, LEFT 04/19/2010   Qualifier: Diagnosis of  By: [name], [name]    Sleep apnea    wears cpap 08/25/19-States not using due to nasal stuffiness.   Spinal stenosis in cervical region 09/26/2013   Spinal stenosis, lumbar region, with neurogenic claudication 09/26/2013   Type II diabetes mellitus ([place]) 2012   no meds in 09/2014.    Umbilical hernia    Uncontrolled type 2 DM with peripheral circulatory disorder 10/04/2013   08/25/19- pt states A1C normal, no diabetes   URETHRAL STRICTURE 06/24/2009   self catheterizes.     Past Surgical History:  Procedure Laterality Date   AMPUTATION Right 06/14/2016   Procedure: AMPUTATION BELOW KNEE;  Surgeon: Newt Minion, MD;  Location: [place];  Service: Orthopedics;  Laterality: Right;   ANKLE FUSION Right 04/15/2014   Procedure: Right Subtalar, Talonavicular Fusion;  Surgeon: Newt Minion, MD;  Location: [place];  Service: Orthopedics;  Laterality: Right;   ANKLE FUSION Right 04/18/2016   Procedure: Right Ankle Tibiocalcaneal Fusion;  Surgeon: Newt Minion, MD;  Location: [place];  Service: Orthopedics;  Laterality: Right;   ANTERIOR CERVICAL DECOMP/DISCECTOMY FUSION N/A 09/26/2013   Procedure: ANTERIOR CERVICAL DISCECTOMY FUSION C3-4, plate and screw fixation, allograft bone graft;  Surgeon: Jessy Oto, MD;  Location: [place];  Service: Orthopedics;  Laterality: N/A;   BACK SURGERY     3   BELOW KNEE LEG AMPUTATION Right 06/14/2016   right ankle and foot   CARDIAC  CATHETERIZATION  X 1   CARPAL TUNNEL RELEASE Bilateral    COLONOSCOPY N/A 10/22/2014   Procedure: COLONOSCOPY;  Surgeon: Lafayette Dragon, MD;  Location: [facility] END[facility]COPY;  Service: Endoscopy;  Laterality: N/A;   COLONOSCOPY  11/19/2018   CORONARY ANGIOPLASTY WITH STENT PLACEMENT     "I have 9 stents"   ESOPHAGOGASTRODUODENOSCOPY N/A 10/19/2014   Procedure: ESOPHAGOGASTRODUODENOSCOPY (EGD);  Surgeon: Jerene Bears, MD;  Location: [facility] ENDOSCOPY;  Service: Endoscopy;  Laterality: N/A;   FRACTURE SURGERY     FUSION OF TALONAVICULAR JOINT Right 04/15/2014   dr duda   GASTRIC BYPASS  02/20/2019   HERNIA REPAIR     umbilical   INGUINAL HERNIA REPAIR Right 05/11/2015   Procedure: LAPAROSCOPIC REPAIR RIGHT  INGUINAL HERNIA;  Surgeon: Greer Pickerel, MD;  Location: [place];  Service: General;  Laterality: Right;  INSERTION OF MESH Right 05/11/2015   Procedure: INSERTION OF MESH;  Surgeon: Greer Pickerel, MD;  Location: [place];  Service: General;  Laterality: Right;   JOINT REPLACEMENT     KNEE CARTILAGE SURGERY Right X 12   "~ 1/2 open; ~ 1/2 scopes"   KNEE CARTILAGE SURGERY Left X 3   "3 scopes"   LAPAROSCOPIC GASTRIC SLEEVE RESECTION N/A 03/03/2019   Procedure: LAPAROSCOPIC GASTRIC SLEEVE RESECTION, Upper Endo, ERAS Pathway;  Surgeon: Greer Pickerel, MD;  Location: [facility];  Service: General;  Laterality: N/A;   LEFT HEART CATHETERIZATION WITH CORONARY ANGIOGRAM N/A 02/10/2013   Procedure: LEFT HEART CATHETERIZATION WITH CORONARY ANGIOGRAM;  Surgeon: Burnell Blanks, MD;  Location[facility] CATH LAB;  Service: Cardiovascular;  Laterality: N/A;   LUMBAR LAMINECTOMY N/A 07/16/2018   Procedure: LEFT L4-5 REDO PARTIAL LUMBAR HEMILAMINECTOMY WITH FORAMINOTOMY LEFT L4;  Surgeon: Jessy Oto, MD;  Location: [place];  Service: Orthopedics;  Laterality: N/A;   LUMBAR LAMINECTOMY/DECOMPRESSION MICRODISCECTOMY N/A 01/27/2014   Procedure: CENTRAL LUMBAR LAMINECTOMY L4-5 AND L3-4;  Surgeon: Jessy Oto, MD;  Location: [place];   Service: Orthopedics;  Laterality: N/A;   ORIF ANKLE FRACTURE Right 09/02/2015   Procedure: OPEN REDUCTION INTERNAL FIXATION (ORIF) ANKLE FRACTURE;  Surgeon: Newt Minion, MD;  Location: [place];  Service: Orthopedics;  Laterality: Right;   PERIPHERALLY INSERTED CENTRAL CATHETER INSERTION  09/02/2015   POLYPECTOMY     ROTATOR CUFF REPAIR Left    x 2   ROTATOR CUFF REPAIR Right    x 3   SHOULDER ARTHROSCOPY W/ ROTATOR CUFF REPAIR Bilateral    "3 on the right; 1 on the left"   SKIN SPLIT GRAFT Right 10/01/2015   Procedure: RIGHT ANKLE APPLY SKIN GRAFT SPLIT THICKNESS;  Surgeon: Newt Minion, MD;  Location: [place];  Service: Orthopedics;  Laterality: Right;   TONSILLECTOMY     TOOTH EXTRACTION     TOTAL HIP ARTHROPLASTY Right 04/17/2018   TOTAL HIP ARTHROPLASTY Right 04/17/2018   Procedure: RIGHT TOTAL HIP ARTHROPLASTY;  Surgeon: Newt Minion, MD;  Location: [place];  Service: Orthopedics;  Laterality: Right;   TOTAL HIP ARTHROPLASTY Left 06/06/2019   Procedure: LEFT TOTAL HIP ARTHROPLASTY ANTERIOR APPROACH;  Surgeon: Mcarthur Rossetti, MD;  Location: [place];  Service: Orthopedics;  Laterality: Left;   TOTAL HIP REVISION Left 05/24/2019   TOTAL KNEE ARTHROPLASTY Bilateral 9628   UMBILICAL HERNIA REPAIR     UHR   UPPER GASTROINTESTINAL ENDOSCOPY  2016   URETHRAL DILATION  X 4   VASECTOMY     WISDOM TOOTH EXTRACTION      FAMHx   Family History  Problem Relation Age of Onset   Depression Mother    Heart disease Mother    Hypertension Mother    Breast cancer Mother        primary cancer   Stomach cancer Mother    Esophageal cancer Mother    Pancreatic cancer Mother    Diabetes Father    Heart disease Father        CABG   Hypertension Father    Hyperlipidemia Father    Prostate cancer Father    Skin cancer Father    Depression Brother        x 2   Hypertension Brother        x2   Colon polyps Brother    Heart attack Son 62   Early death Son    Heart disease Maternal  Grandfather    Early death Maternal Grandfather    Heart attack Maternal Grandfather 59   Early death Paternal Grandfather    Coronary artery disease Other    Hypertension Other    Depression Other    Healthy Son    Colon cancer Neg Hx    Rectal cancer Neg Hx     SOCHx    reports that he quit smoking about 10 years ago. His smoking use included cigars. He has never used smokeless tobacco. He reports current alcohol use. He reports that he does not use drugs.  Outpatient Medications   No current facility-administered medications on file prior to encounter.   Current Outpatient Medications on File Prior to Encounter  Medication Sig Dispense Refill   albuterol (VENTOLIN HFA) 108 (90 Base) MCG/ACT inhaler Inhale 2 puffs into the lungs every 6 (six) hours as needed for wheezing or shortness of breath. 18 g 1   ARIPiprazole (ABILIFY) 10 MG tablet Take 1 tablet (10 mg total) by mouth daily. 90 tablet 0   aspirin EC 81 MG tablet Take 81 mg by mouth at bedtime. Swallow whole.     atorvastatin (LIPITOR) 20 MG tablet Take 1 tablet (20 mg total) by mouth daily. 90 tablet 0   budesonide-formoterol (SYMBICORT) 160-4.5 MCG/ACT inhaler Inhale 2 puffs into the lungs 2 (two) times daily. 3 each 3   buprenorphine (BUTRANS) 20 MCG/HR PTWK Place 1 patch onto the skin every Thursday.     calcium carbonate (TUMS - DOSED IN MG ELEMENTAL CALCIUM) 500 MG chewable tablet Chew 1 tablet by mouth 3 (three) times daily with meals.     celecoxib (CELEBREX) 200 MG capsule Take 1 capsule (200 mg total) by mouth 2 (two) times daily. 40 capsule 0   cetirizine (ZYRTEC) 10 MG tablet Take 1 tablet (10 mg total) by mouth daily. 30 tablet 11   Cholecalciferol (VITAMIN D3 PO) Take 2 tablets by mouth at bedtime.     donepezil (ARICEPT) 5 MG tablet Take 1 tablet (5 mg total) by mouth at bedtime. 90 tablet 3   DULoxetine (CYMBALTA) 20 MG capsule Take 40 mg by mouth in the morning and at bedtime.     Ferrous Sulfate (IRON PO)  Take by mouth.     fluticasone (FLONASE) 50 MCG/ACT nasal spray Place 2 sprays into both nostrils in the morning and at bedtime.     gabapentin (NEURONTIN) 800 MG tablet Take 800 mg by mouth 3 (three) times daily.     hydrochlorothiazide (HYDRODIURIL) 25 MG tablet Take 1 tablet (25 mg total) by mouth daily. 90 tablet 3   HYDROcodone-acetaminophen (NORCO) 10-325 MG tablet Take 1 tablet by mouth in the morning, at noon, and at bedtime.     losartan (COZAAR) 50 MG tablet Take 1 tablet (50 mg total) by mouth daily. 90 tablet 3   melatonin 5 MG TABS Take 15 mg by mouth at bedtime.     methocarbamol (ROBAXIN) 500 MG tablet Take 1 tablet (500 mg total) by mouth every 8 (eight) hours as needed for muscle spasms. 90 tablet 1   MYRBETRIQ 50 MG TB24 tablet Take 50 mg by mouth at bedtime.     Omega-3 Fatty Acids (FISH OIL) 1000 MG CAPS Take 2,000 mg by mouth at bedtime.     Pancrelipase, Lip-Prot-Amyl, (ZENPEP) 40000-126000 units CPEP Take 2 capsules (80,000 units) with meals and 1 capsule (40,000 units) with each snack. PLEASE SCHEDULE A YEARLY FOLLOW UP FOR FURTHER REFILLS. Thank you 240  capsule 1   prasugrel (EFFIENT) 5 MG TABS tablet Take 1 tablet (5 mg total) by mouth daily. 90 tablet 3   tamsulosin (FLOMAX) 0.4 MG CAPS capsule Take 0.4 mg by mouth 2 (two) times daily.     Testosterone 1.62 % GEL Place 1 Pump onto the skin daily. 88 g 1   traZODone (DESYREL) 150 MG tablet Take 1 tablet (150 mg total) by mouth at bedtime. 90 tablet 0   Azelastine-Fluticasone 137-50 MCG/ACT SUSP Use both nostrils as directed twice daily 23 g 11   benztropine (COGENTIN) 0.5 MG tablet Take 1 tablet (0.5 mg total) by mouth at bedtime. 90 tablet 0   phentermine 37.5 MG capsule Take 1 capsule (37.5 mg total) by mouth every morning. 30 capsule 0   predniSONE (DELTASONE) 10 MG tablet 3 tabs by mouth per day for 3 days,2tabs per day for 3 days,1tab per day for 3 days 18 tablet 0   testosterone (ANDROGEL) 50 MG/5GM (1%) GEL Place 5  g onto the skin daily. 150 g 5    Inpatient Medications    Scheduled Meds:  acetaminophen  500 mg Oral Q6H   ARIPiprazole  10 mg Oral Daily   aspirin EC  81 mg Oral Daily   docusate sodium  100 mg Oral BID   donepezil  5 mg Oral QHS   DULoxetine  40 mg Oral BID   fentaNYL       gabapentin  800 mg Oral TID   hydrochlorothiazide  25 mg Oral Daily   [START ON 03/18/2021] losartan  50 mg Oral Daily   mirabegron ER  50 mg Oral QHS   [START ON 03/18/2021] mometasone-formoterol  2 puff Inhalation BID   prasugrel  5 mg Oral Daily    Continuous Infusions:   ceFAZolin (ANCEF) IV     lactated ringers     methocarbamol (ROBAXIN) IV      PRN Meds: [START ON 03/18/2021] acetaminophen, albuterol, HYDROcodone-acetaminophen, menthol-cetylpyridinium **OR** phenol, methocarbamol **OR** methocarbamol (ROBAXIN) IV, metoCLOPramide **OR** metoCLOPramide (REGLAN) injection, morphine injection, ondansetron **OR** ondansetron (ZOFRAN) IV   ALLERGIES   No Known Allergies  ROS   Pertinent items noted in HPI and remainder of comprehensive ROS otherwise negative.  Vitals   Vitals:   03/17/21 1717 03/17/21 1732 03/17/21 1747 03/17/21 1842  BP: (!) 141/56 (!) 150/68 (!) 144/79 (!) 134/58  Pulse: 80 84 78 76  Resp: 12 16 16 15   Temp:    98.4 F (36.9 C)  TempSrc:    Oral  SpO2: 97% 96% 99% 97%  Weight:      Height:        Intake/Output Summary (Last 24 hours) at 03/17/2021 1904 Last data filed at 03/17/2021 1607 Gross per 24 hour  Intake 1800 ml  Output 500 ml  Net 1300 ml   Filed Weights   03/17/21 0857  Weight: 135.6 kg    Physical Exam   General appearance: alert, no distress, and lying supine in bed Neck: no carotid bruit, no JVD, and thyroid not enlarged, symmetric, no tenderness/mass/nodules Lungs: clear to auscultation bilaterally Heart: regular rate and rhythm, S1, S2 normal, and systolic murmur: early systolic 2/6, blowing at apex Abdomen: soft, non-tender; bowel sounds  normal; no masses,  no organomegaly and obese Extremities: extremities normal, atraumatic, no cyanosis or edema and left arm in a sling with ice packs on the left shoulder Pulses: 2+ and symmetric Skin: Skin color, texture, turgor normal. No rashes or lesions Neurologic: Grossly normal  Psych: Pleasant  Labs   Results for orders placed or performed during the hospital encounter of 03/17/21 (from the past 48 hour(s))  SARS Coronavirus 2 by RT PCR (hospital order, performed in [facility] hospital lab) Nasopharyngeal Nasopharyngeal Swab     Status: None   Collection Time: 03/17/21  8:51 AM   Specimen: Nasopharyngeal Swab  Result Value Ref Range   SARS Coronavirus 2 NEGATIVE NEGATIVE    Comment: Performed at [facility], [address], [place], [place] [address]  Glucose, capillary     Status: None   Collection Time: 03/17/21  8:51 AM  Result Value Ref Range   Glucose-Capillary 86 70 - 99 mg/dL    Comment: Glucose reference range applies only to samples taken after fasting for at least 8 hours.  Glucose, capillary     Status: Abnormal   Collection Time: 03/17/21 12:06 PM  Result Value Ref Range   Glucose-Capillary 100 (H) 70 - 99 mg/dL    Comment: Glucose reference range applies only to samples taken after fasting for at least 8 hours.  I-STAT 7, (LYTES, BLD GAS, ICA, H+H)     Status: Abnormal   Collection Time: 03/17/21  2:18 PM  Result Value Ref Range   pH, Arterial 7.360 7.350 - 7.450   pCO2 arterial 48.1 (H) 32.0 - 48.0 mmHg   pO2, Arterial 218 (H) 83.0 - 108.0 mmHg   Bicarbonate 27.2 20.0 - 28.0 mmol/L   TCO2 29 22 - 32 mmol/L   O2 Saturation 100.0 %   Acid-Base Excess 1.0 0.0 - 2.0 mmol/L   Sodium 138 135 - 145 mmol/L   Potassium 3.5 3.5 - 5.1 mmol/L   Calcium, Ion 1.41 (H) 1.15 - 1.40 mmol/L   HCT 36.0 (L) 39.0 - 52.0 %   Hemoglobin 12.2 (L) 13.0 - 17.0 g/dL   Sample type ARTERIAL   Glucose, capillary     Status: Abnormal   Collection Time: 03/17/21  5:23 PM   Result Value Ref Range   Glucose-Capillary 168 (H) 70 - 99 mg/dL    Comment: Glucose reference range applies only to samples taken after fasting for at least 8 hours.   *Note: Due to a large number of results and/or encounters for the requested time period, some results have not been displayed. A complete set of results can be found in Results Review.    ECG   Post-op EKG pending - Personally Reviewed  Telemetry   N/A  Radiology   No results found.  Cardiac Studies   N/A  Impression   Active Problems:   Hypotension during surgery   S/P reverse total shoulder arthroplasty, left   Recommendation   Mr. Cherry had intraoperative hypotension of unknown etiology.  He is completely asymptomatic at this time.  An EKG has been ordered but not yet obtained.  Blood pressures have recovered is now hypertensive at this point.  There was some notable bradycardia with heart rates predominantly in the 40s and 50s prior to surgery which appears to have resolved, however, he was given ephedrine, calcium chloride, vasopressin and atropine during the procedure.  According to anesthesia the heart rhythm was noted to be sinus bradycardia and eventually a junctional rhythm associated with hypotension with development of sinus rhythm and PVCs later.  Would recommend echocardiogram tomorrow to evaluate for any degree of cardiomyopathy.  Monitor telemetry overnight for bradycardia or heart block.  We will follow-up on EKG and echocardiogram tomorrow.  Would not recommend checking troponins  given arterial hypotension as they would not necessarily reflect intraoperative MI whether could be result of demand ischemia in the setting of hypotension if elevated.  Thanks for the consultation.  Time Spent Directly with Patient:  I have spent a total of 45 minutes with the patient reviewing hospital notes, telemetry, EKGs, labs and examining the patient as well as establishing an assessment and plan that was  discussed personally with the patient.  > 50% of time was spent in direct patient care.  Length of Stay:  LOS: 0 days   Pixie Casino, MD, [facility], [place] Director of the Advanced Lipid Disorders &  Cardiovascular Risk Reduction Clinic Diplomate of the American Board of Clinical Lipidology Attending Cardiologist  Direct Dial: [phone]  Fax: [phone]  Website:  [email or web address] 03/17/2021, 7:04 PM

## 2021-03-17 NOTE — Brief Op Note (Signed)
   03/17/2021  4:25 PM  PATIENT:  Travis [name].  70 y.o. male  PRE-OPERATIVE DIAGNOSIS:  left shoulder osteoarthritis  POST-OPERATIVE DIAGNOSIS:  left shoulder osteoarthritis  PROCEDURE:  Procedure(s): LEFT REVERSE SHOULDER ARTHROPLASTY  SURGEON:  Surgeon(s): Meredith Pel, MD  ASSISTANT: magnant pa  ANESTHESIA:   general  EBL: 350 ml    Total I/O In: 1800 [I.V.:1100; IV Piggyback:700] Out: 500 [Blood:500]  BLOOD ADMINISTERED: none  DRAINS: none   LOCAL MEDICATIONS USED:  vanco  SPECIMEN:  No Specimen  COUNTS:  YES  TOURNIQUET:  * No tourniquets in log *  DICTATION: .Other Dictation: Dictation Number [id number]  PLAN OF CARE: Admit to inpatient   PATIENT DISPOSITION:  PACU - hemodynamically stable

## 2021-03-17 NOTE — Anesthesia Procedure Notes (Signed)
Arterial Line Insertion Start/End10/27/2022 1:10 PM, 03/17/2021 1:13 PM Performed by: CRNA  Patient location: OR. Preanesthetic checklist: patient identified, IV checked, site marked, risks and benefits discussed, surgical consent, monitors and equipment checked, pre-op evaluation, timeout performed and anesthesia consent Right, radial was placed Catheter size: 20 G Hand hygiene performed , maximum sterile barriers used  and Seldinger technique used Allen's test indicative of satisfactory collateral circulation Attempts: 1 Procedure performed without using ultrasound guided technique. Following insertion, dressing applied and Biopatch. Post procedure assessment: normal  Patient tolerated the procedure well with no immediate complications.

## 2021-03-17 NOTE — H&P (Signed)
Travis KROMER Sr. is an 70 y.o. male.   Chief Complaint: Left shoulder pain HPI: . Travis [name]. is a 70 y.o. male who presents to the office complaining of left shoulder pain.  Patient complains of severe pain in the left shoulder has been ongoing for at least 6 months.  He denies any history of injury recently.  Localizes pain to the lateral aspect of the shoulder as well as the mid humerus with radiation to the elbow on occasion.  No radicular pain past the elbow, neck pain, scapular pain.  He does have occasional numbness and tingling in the arm but this is only about once per week.  He has pain with pretty much moving his arm in any direction.  Feels weak with the left arm as far as lifting with his shoulder but the pain is more bothersome than the weakness.  He has history of prior arthroscopic rotator cuff repair about 15 to 20 years ago.  He has also been seen for his right shoulder in the past and was told he needs shoulder replacement.  The left shoulder is bothering him much more than the right shoulder.  He wakes with pain every night.  In his free time he enjoys billiards, darts, going to the beach.  He is currently retired and on disability.  He does take Effient.  Has history of 9 stents and his cardiologist is Dr. Angelena Form.  Takes Norco 10 mg 4 times per day from pain management for back pain.  No history of diabetes l  eft ventricular ejection fraction 47%.  Past Medical History:  Diagnosis Date   ALLERGIC RHINITIS 06/24/2009   Allergy    Anemia    IDA   Anginal pain ([place])    Anxiety 11/12/2011   Adequate for discharge    Arthritis    "all my joints" (09/30/2013)   Arthritis of foot, right, degenerative 04/15/2014   Balance disorder 03/12/2013   Benign neoplasm of cecum    Benign neoplasm of descending colon    CAD (coronary artery disease) 06/24/2009   5 stents placed in 2007     Chronic anticoagulation    Chronic pain syndrome 10/27/2009   of ankle, shoulders,  low back.  sciatica.    Closed fracture of right foot 10/17/2014   CORONARY ARTERY DISEASE 06/24/2009   a. s/p multiple PCIs - In 2008 he had a Taxus DES to the mild LAD, Endeavor DES to mid LCX and distal LCX. In January 2009 he had DES to distal LCX, mid LCX and proximal LCX. In November 2009 had BMS x 2 to the mid RCA. Cath 10/2011 with patent stents, noncardiac CP. LHC 01/2013: patent stents (noncardiac CP).   DEGENERATIVE JOINT DISEASE 06/24/2009   Qualifier: Diagnosis of  By: [name], [name]    Depression    Depression with anxiety    Prior suicide attempt(08/25/19-pt states not suicide attempt)   [place] DISEASE, LUMBAR 04/19/2010   Dyspnea    Dysrhythmia    ERECTILE DYSFUNCTION, ORGANIC 05/30/2010   Essential hypertension 06/24/2009   Qualifier: Diagnosis of  By: [name], [name]    Fibromyalgia    Fracture dislocation of ankle joint 09/02/2015   Gait disorder 03/12/2013   General weakness 07/14/2014   GERD (gastroesophageal reflux disease) 09/08/2015   08/25/15-pt states was cardiac origin, not GERD   Hand joint pain 06/10/2013   Heart murmur    Hepatitis C    treated pt. unknown with  what he was a teenager   History of kidney stones    Hyperlipidemia 07/15/2009   Qualifier: Diagnosis of  By: Aundra Dubin, MD, [place]     HYPERLIPIDEMIA-MIXED 07/15/2009   08/25/19-pt state cholesterol was normal range   HYPERTENSION 06/24/2009   Insomnia 10/04/2011   Irregular heart beat    Left hip pain 03/12/2013   Injected under ultrasound guidance on June 24, 2013    Major depression 09/13/2015   Myocardial infarction [facility]) 2008   Non-cardiac chest pain 10/2011, 01/2013   Obesity    Occult blood positive stool 10/17/2014   Open ankle fracture 09/02/2015   OSA (obstructive sleep apnea)    not using CPAP (09/30/2013)   Pain of right thumb 04/03/2013   Pneumonia    PPD positive 04/08/2015   Pre-ulcerative corn or callous 02/06/2013   Rotator cuff tear arthropathy of both shoulders 06/10/2013    History of bilateral shoulder cuff surgery for rotator cuff tears. Reports increase in pain 09/11/2015 during physical therapy of the left shoulder.    SCIATICA, LEFT 04/19/2010   Qualifier: Diagnosis of  By: [name], [name]    Sleep apnea    wears cpap 08/25/19-States not using due to nasal stuffiness.   Spinal stenosis in cervical region 09/26/2013   Spinal stenosis, lumbar region, with neurogenic claudication 09/26/2013   Type II diabetes mellitus ([place]) 2012   no meds in 09/2014.    Umbilical hernia    Uncontrolled type 2 DM with peripheral circulatory disorder 10/04/2013   08/25/19- pt states A1C normal, no diabetes   URETHRAL STRICTURE 06/24/2009   self catheterizes.     Past Surgical History:  Procedure Laterality Date   AMPUTATION Right 06/14/2016   Procedure: AMPUTATION BELOW KNEE;  Surgeon: Newt Minion, MD;  Location: [place];  Service: Orthopedics;  Laterality: Right;   ANKLE FUSION Right 04/15/2014   Procedure: Right Subtalar, Talonavicular Fusion;  Surgeon: Newt Minion, MD;  Location: [place];  Service: Orthopedics;  Laterality: Right;   ANKLE FUSION Right 04/18/2016   Procedure: Right Ankle Tibiocalcaneal Fusion;  Surgeon: Newt Minion, MD;  Location: [place];  Service: Orthopedics;  Laterality: Right;   ANTERIOR CERVICAL DECOMP/DISCECTOMY FUSION N/A 09/26/2013   Procedure: ANTERIOR CERVICAL DISCECTOMY FUSION C3-4, plate and screw fixation, allograft bone graft;  Surgeon: Jessy Oto, MD;  Location: [place];  Service: Orthopedics;  Laterality: N/A;   BACK SURGERY     3   BELOW KNEE LEG AMPUTATION Right 06/14/2016   right ankle and foot   CARDIAC CATHETERIZATION  X 1   CARPAL TUNNEL RELEASE Bilateral    COLONOSCOPY N/A 10/22/2014   Procedure: COLONOSCOPY;  Surgeon: Lafayette Dragon, MD;  Location: [facility] END[facility]COPY;  Service: Endoscopy;  Laterality: N/A;   COLONOSCOPY  11/19/2018   CORONARY ANGIOPLASTY WITH STENT PLACEMENT     "I have 9 stents"   ESOPHAGOGASTRODUODENOSCOPY N/A  10/19/2014   Procedure: ESOPHAGOGASTRODUODENOSCOPY (EGD);  Surgeon: Jerene Bears, MD;  Location: [facility] ENDOSCOPY;  Service: Endoscopy;  Laterality: N/A;   FRACTURE SURGERY     FUSION OF TALONAVICULAR JOINT Right 04/15/2014   dr duda   GASTRIC BYPASS  02/20/2019   HERNIA REPAIR     umbilical   INGUINAL HERNIA REPAIR Right 05/11/2015   Procedure: LAPAROSCOPIC REPAIR RIGHT  INGUINAL HERNIA;  Surgeon: Greer Pickerel, MD;  Location: [place];  Service: General;  Laterality: Right;   INSERTION OF MESH Right 05/11/2015   Procedure: INSERTION OF MESH;  Surgeon: Greer Pickerel, MD;  Location: [place];  Service: General;  Laterality: Right;   JOINT REPLACEMENT     KNEE CARTILAGE SURGERY Right X 12   "~ 1/2 open; ~ 1/2 scopes"   KNEE CARTILAGE SURGERY Left X 3   "3 scopes"   LAPAROSCOPIC GASTRIC SLEEVE RESECTION N/A 03/03/2019   Procedure: LAPAROSCOPIC GASTRIC SLEEVE RESECTION, Upper Endo, ERAS Pathway;  Surgeon: Greer Pickerel, MD;  Location: [facility];  Service: General;  Laterality: N/A;   LEFT HEART CATHETERIZATION WITH CORONARY ANGIOGRAM N/A 02/10/2013   Procedure: LEFT HEART CATHETERIZATION WITH CORONARY ANGIOGRAM;  Surgeon: Burnell Blanks, MD;  Location: [facility] CATH LAB;  Service: Cardiovascular;  Laterality: N/A;   LUMBAR LAMINECTOMY N/A 07/16/2018   Procedure: LEFT L4-5 REDO PARTIAL LUMBAR HEMILAMINECTOMY WITH FORAMINOTOMY LEFT L4;  Surgeon: Jessy Oto, MD;  Location: [place];  Service: Orthopedics;  Laterality: N/A;   LUMBAR LAMINECTOMY/DECOMPRESSION MICRODISCECTOMY N/A 01/27/2014   Procedure: CENTRAL LUMBAR LAMINECTOMY L4-5 AND L3-4;  Surgeon: Jessy Oto, MD;  Location: [place];  Service: Orthopedics;  Laterality: N/A;   ORIF ANKLE FRACTURE Right 09/02/2015   Procedure: OPEN REDUCTION INTERNAL FIXATION (ORIF) ANKLE FRACTURE;  Surgeon: Newt Minion, MD;  Location: [place];  Service: Orthopedics;  Laterality: Right;   PERIPHERALLY INSERTED CENTRAL CATHETER INSERTION  09/02/2015   POLYPECTOMY     ROTATOR CUFF  REPAIR Left    x 2   ROTATOR CUFF REPAIR Right    x 3   SHOULDER ARTHROSCOPY W/ ROTATOR CUFF REPAIR Bilateral    "3 on the right; 1 on the left"   SKIN SPLIT GRAFT Right 10/01/2015   Procedure: RIGHT ANKLE APPLY SKIN GRAFT SPLIT THICKNESS;  Surgeon: Newt Minion, MD;  Location: [place];  Service: Orthopedics;  Laterality: Right;   TONSILLECTOMY     TOOTH EXTRACTION     TOTAL HIP ARTHROPLASTY Right 04/17/2018   TOTAL HIP ARTHROPLASTY Right 04/17/2018   Procedure: RIGHT TOTAL HIP ARTHROPLASTY;  Surgeon: Newt Minion, MD;  Location: [place];  Service: Orthopedics;  Laterality: Right;   TOTAL HIP ARTHROPLASTY Left 06/06/2019   Procedure: LEFT TOTAL HIP ARTHROPLASTY ANTERIOR APPROACH;  Surgeon: Mcarthur Rossetti, MD;  Location: [place];  Service: Orthopedics;  Laterality: Left;   TOTAL HIP REVISION Left 05/24/2019   TOTAL KNEE ARTHROPLASTY Bilateral [id number]   UMBILICAL HERNIA REPAIR     UHR   UPPER GASTROINTESTINAL ENDOSCOPY  2016   URETHRAL DILATION  X 4   VASECTOMY     WISDOM TOOTH EXTRACTION      Family History  Problem Relation Age of Onset   Depression Mother    Heart disease Mother    Hypertension Mother    Breast cancer Mother        primary cancer   Stomach cancer Mother    Esophageal cancer Mother    Pancreatic cancer Mother    Diabetes Father    Heart disease Father        CABG   Hypertension Father    Hyperlipidemia Father    Prostate cancer Father    Skin cancer Father    Depression Brother        x 2   Hypertension Brother        x2   Colon polyps Brother    Heart attack Son 32   Early death Son    Heart disease Maternal Grandfather    Early death Maternal Grandfather    Heart attack Maternal Grandfather  71   Early death Paternal Grandfather    Coronary artery disease Other    Hypertension Other    Depression Other    Healthy Son    Colon cancer Neg Hx    Rectal cancer Neg Hx    Social History:  reports that he quit smoking about 10 years ago. His  smoking use included cigars. He has never used smokeless tobacco. He reports current alcohol use. He reports that he does not use drugs.  Allergies: No Known Allergies  Medications Prior to Admission  Medication Sig Dispense Refill   albuterol (VENTOLIN HFA) 108 (90 Base) MCG/ACT inhaler Inhale 2 puffs into the lungs every 6 (six) hours as needed for wheezing or shortness of breath. 18 g 1   ARIPiprazole (ABILIFY) 10 MG tablet Take 1 tablet (10 mg total) by mouth daily. 90 tablet 0   aspirin EC 81 MG tablet Take 81 mg by mouth at bedtime. Swallow whole.     atorvastatin (LIPITOR) 20 MG tablet Take 1 tablet (20 mg total) by mouth daily. 90 tablet 0   budesonide-formoterol (SYMBICORT) 160-4.5 MCG/ACT inhaler Inhale 2 puffs into the lungs 2 (two) times daily. 3 each 3   buprenorphine (BUTRANS) 20 MCG/HR PTWK Place 1 patch onto the skin every Thursday.     calcium carbonate (TUMS - DOSED IN MG ELEMENTAL CALCIUM) 500 MG chewable tablet Chew 1 tablet by mouth 3 (three) times daily with meals.     celecoxib (CELEBREX) 200 MG capsule Take 1 capsule (200 mg total) by mouth 2 (two) times daily. 40 capsule 0   cetirizine (ZYRTEC) 10 MG tablet Take 1 tablet (10 mg total) by mouth daily. 30 tablet 11   Cholecalciferol (VITAMIN D3 PO) Take 2 tablets by mouth at bedtime.     donepezil (ARICEPT) 5 MG tablet Take 1 tablet (5 mg total) by mouth at bedtime. 90 tablet 3   DULoxetine (CYMBALTA) 20 MG capsule Take 40 mg by mouth in the morning and at bedtime.     Ferrous Sulfate (IRON PO) Take by mouth.     fluticasone (FLONASE) 50 MCG/ACT nasal spray Place 2 sprays into both nostrils in the morning and at bedtime.     gabapentin (NEURONTIN) 800 MG tablet Take 800 mg by mouth 3 (three) times daily.     hydrochlorothiazide (HYDRODIURIL) 25 MG tablet Take 1 tablet (25 mg total) by mouth daily. 90 tablet 3   HYDROcodone-acetaminophen (NORCO) 10-325 MG tablet Take 1 tablet by mouth in the morning, at noon, and at  bedtime.     losartan (COZAAR) 50 MG tablet Take 1 tablet (50 mg total) by mouth daily. 90 tablet 3   melatonin 5 MG TABS Take 15 mg by mouth at bedtime.     methocarbamol (ROBAXIN) 500 MG tablet Take 1 tablet (500 mg total) by mouth every 8 (eight) hours as needed for muscle spasms. 90 tablet 1   MYRBETRIQ 50 MG TB24 tablet Take 50 mg by mouth at bedtime.     Omega-3 Fatty Acids (FISH OIL) 1000 MG CAPS Take 2,000 mg by mouth at bedtime.     Pancrelipase, Lip-Prot-Amyl, (ZENPEP) 40000-126000 units CPEP Take 2 capsules (80,000 units) with meals and 1 capsule (40,000 units) with each snack. PLEASE SCHEDULE A YEARLY FOLLOW UP FOR FURTHER REFILLS. Thank you 240 capsule 1   prasugrel (EFFIENT) 5 MG TABS tablet Take 1 tablet (5 mg total) by mouth daily. 90 tablet 3   tamsulosin (FLOMAX) 0.4 MG CAPS capsule  Take 0.4 mg by mouth 2 (two) times daily.     Testosterone 1.62 % GEL Place 1 Pump onto the skin daily. 88 g 1   traZODone (DESYREL) 150 MG tablet Take 1 tablet (150 mg total) by mouth at bedtime. 90 tablet 0   zolpidem (AMBIEN) 10 MG tablet Take 2 tablets (20 mg total) by mouth at bedtime as needed for sleep. 180 tablet 1   Azelastine-Fluticasone 137-50 MCG/ACT SUSP Use both nostrils as directed twice daily 23 g 11   benztropine (COGENTIN) 0.5 MG tablet Take 1 tablet (0.5 mg total) by mouth at bedtime. 90 tablet 0   diclofenac Sodium (VOLTAREN) 1 % GEL Apply 4 g topically 3 (three) times daily as needed (pain). 350 g 5   DULoxetine 40 MG CPEP Take 1 capsule (6m total) by mouth daily. 45 capsule 0   phentermine 37.5 MG capsule Take 1 capsule (37.5 mg total) by mouth every morning. 30 capsule 0   predniSONE (DELTASONE) 10 MG tablet 3 tabs by mouth per day for 3 days,2tabs per day for 3 days,1tab per day for 3 days 18 tablet 0   testosterone (ANDROGEL) 50 MG/5GM (1%) GEL Place 5 g onto the skin daily. 150 g 5    Results for orders placed or performed during the hospital encounter of 03/17/21 (from  the past 48 hour(s))  SARS Coronavirus 2 by RT PCR (hospital [facility] Nasopharyngeal Nasopharyngeal Swab     Status: None   Collection Time: 03/17/21  8:51 AM   Specimen: Nasopharyngeal Swab  Result Value Ref Range   SARS Coronavirus 2 NEGATIVE NEGATIVE    Comment: [facility] [address] [place] [id number] Glucose, capillary     Status: None   Collection Time: 03/17/21  8:51 AM  Result Value Ref Range   Glucose-Capillary 86 70 - 99 mg/dL    Comment: Glucose reference range applies only to samples taken after fasting for at least 8 hours.   *Note: Due to a large number of results and/or encounters for the requested time period, some results have not been displayed. A complete set of results can be found in Results Review.   No results found.  Review of Systems  Musculoskeletal:  Positive for arthralgias.  All other systems reviewed and are negative.  Blood pressure (!) 138/55, pulse 67, temperature 97.7 F (36.5 C), temperature source Oral, resp. rate 18, height 5' 11"  (1.803 m), weight 135.6 kg, SpO2 93 %. Physical Exam Vitals reviewed.  HENT:     Head: Normocephalic.     Nose: Nose normal.     Mouth/Throat:     Mouth: Mucous membranes are moist.  Eyes:     Pupils: Pupils are equal, round, and reactive to light.  Cardiovascular:     Rate and Rhythm: Normal rate.     Pulses: Normal pulses.  Pulmonary:     Effort: Pulmonary effort is normal.  Abdominal:     General: Abdomen is flat.  Musculoskeletal:     Cervical back: Normal range of motion.  Skin:    General: Skin is warm.     Capillary Refill: Capillary refill takes less than 2 seconds.  Neurological:     General: No focal deficit present.     Mental Status: He is alert.  Psychiatric:        Mood and Affect: Mood normal.    Ortho exam demonstrates left shoulder with 30 degrees  external rotation, 95 degrees abduction, 95 degrees forward flexion.   Weakness with external rotation.  Axillary nerve intact with deltoid firing.  2+ radial pulse of the left upper extremity.  5/5 motor strength of bilateral grip strength, finger abduction, pronation/supination, bicep, tricep, deltoid.  Weakness with supraspinatus strength testing but subscapularis seems to be intact by strength assessment. Assessment/Plan  Impression is end-stage rotator cuff arthropathy and arthritis in the left shoulder.  Plan is reverse shoulder replacement.  Risk and benefits are discussed with the patient including but not limited to infection nerve vessel damage shoulder instability as well as incomplete pain relief and potential need for revision.  Patient understands the risk benefits and wishes to proceed.  All questions answered patient is at high risk due to his multiple medical comorbidities.  Plan to keep overnight and restart anticoagulants on the night of surgery.  Anderson Malta, MD 03/17/2021, 11:47 AM

## 2021-03-17 NOTE — Anesthesia Procedure Notes (Signed)
Anesthesia Regional Block: Interscalene brachial plexus block   Pre-Anesthetic Checklist: , timeout performed,  Correct Patient, Correct Site, Correct Laterality,  Correct Procedure, Correct Position, site marked,  Risks and benefits discussed,  Surgical consent,  Pre-op evaluation,  At surgeon's request and post-op pain management  Laterality: Upper and Left  Prep: chloraprep       Needles:  Injection technique: Single-shot  Needle Type: Stimulator Needle - 40     Needle Length: 4cm  Needle Gauge: 22     Additional Needles:   Procedures:,,,, ultrasound used (permanent image in chart),,    Narrative:  Start time: 03/17/2021 11:51 AM End time: 03/17/2021 12:11 PM Injection made incrementally with aspirations every 5 mL.  Performed by: Personally  Anesthesiologist: Nolon Nations, MD  Additional Notes: BP cuff, SpO2 and EKG monitors applied. Sedation begun. Nerve location verified with ultrasound. Anesthetic injected incrementally, slowly, and after neg aspirations under direct u/s guidance. Good perineural spread. Tolerated well.

## 2021-03-18 ENCOUNTER — Inpatient Hospital Stay (HOSPITAL_COMMUNITY): Payer: Medicare Other

## 2021-03-18 ENCOUNTER — Encounter (HOSPITAL_COMMUNITY): Payer: Self-pay | Admitting: Orthopedic Surgery

## 2021-03-18 DIAGNOSIS — I9788 Other intraoperative complications of the circulatory system, not elsewhere classified: Secondary | ICD-10-CM | POA: Diagnosis not present

## 2021-03-18 DIAGNOSIS — I441 Atrioventricular block, second degree: Secondary | ICD-10-CM | POA: Diagnosis not present

## 2021-03-18 DIAGNOSIS — I9589 Other hypotension: Secondary | ICD-10-CM | POA: Diagnosis not present

## 2021-03-18 DIAGNOSIS — R9431 Abnormal electrocardiogram [ECG] [EKG]: Secondary | ICD-10-CM

## 2021-03-18 LAB — ECHOCARDIOGRAM COMPLETE
AR max vel: 2.94 cm2
AV Peak grad: 14.9 mmHg
Ao pk vel: 1.93 m/s
Area-P 1/2: 6.02 cm2
Calc EF: 60.3 %
Height: 71 in
S' Lateral: 2.6 cm
Single Plane A2C EF: 60 %
Single Plane A4C EF: 60.8 %
Weight: 4784 oz

## 2021-03-18 LAB — CBC
HCT: 32.4 % — ABNORMAL LOW (ref 39.0–52.0)
Hemoglobin: 11.1 g/dL — ABNORMAL LOW (ref 13.0–17.0)
MCH: 30.6 pg (ref 26.0–34.0)
MCHC: 34.3 g/dL (ref 30.0–36.0)
MCV: 89.3 fL (ref 80.0–100.0)
Platelets: 234 10*3/uL (ref 150–400)
RBC: 3.63 MIL/uL — ABNORMAL LOW (ref 4.22–5.81)
RDW: 14.5 % (ref 11.5–15.5)
WBC: 8.9 10*3/uL (ref 4.0–10.5)
nRBC: 0 % (ref 0.0–0.2)

## 2021-03-18 LAB — BASIC METABOLIC PANEL
Anion gap: 6 (ref 5–15)
BUN: 17 mg/dL (ref 8–23)
CO2: 27 mmol/L (ref 22–32)
Calcium: 8.8 mg/dL — ABNORMAL LOW (ref 8.9–10.3)
Chloride: 103 mmol/L (ref 98–111)
Creatinine, Ser: 1.14 mg/dL (ref 0.61–1.24)
GFR, Estimated: >60 mL/min (ref >60)
Glucose, Bld: 217 mg/dL — ABNORMAL HIGH (ref 70–99)
Potassium: 3.9 mmol/L (ref 3.5–5.1)
Sodium: 136 mmol/L (ref 135–145)

## 2021-03-18 MED ORDER — ZOLPIDEM TARTRATE 5 MG PO TABS
5.0000 mg | ORAL_TABLET | Freq: Every evening | ORAL | Status: DC | PRN
  Administered 2021-03-18 – 2021-03-20 (×2): 5 mg via ORAL
  Filled 2021-03-18 (×2): qty 1

## 2021-03-18 MED ORDER — HYDROCODONE-ACETAMINOPHEN 5-325 MG PO TABS
1.0000 | ORAL_TABLET | ORAL | Status: DC
  Administered 2021-03-18 – 2021-03-19 (×4): 2 via ORAL
  Administered 2021-03-19: 1 via ORAL
  Administered 2021-03-19 – 2021-03-20 (×7): 2 via ORAL
  Filled 2021-03-18: qty 1
  Filled 2021-03-18 (×11): qty 2

## 2021-03-18 MED ORDER — PANCRELIPASE (LIP-PROT-AMYL) 36000-114000 UNITS PO CPEP
72000.0000 [IU] | ORAL_CAPSULE | Freq: Three times a day (TID) | ORAL | Status: DC
  Administered 2021-03-18 – 2021-03-20 (×7): 72000 [IU] via ORAL
  Filled 2021-03-18 (×8): qty 2

## 2021-03-18 NOTE — Anesthesia Postprocedure Evaluation (Signed)
Anesthesia Post Note  Patient: Travis STRASSMAN Sr.  Procedure(s) Performed: LEFT REVERSE SHOULDER ARTHROPLASTY (Left: Shoulder)     Patient location during evaluation: PACU Anesthesia Type: General Level of consciousness: sedated and patient cooperative Pain management: pain level controlled Vital Signs Assessment: post-procedure vital signs reviewed and stable Respiratory status: spontaneous breathing Cardiovascular status: stable Anesthetic complications: no Comments: Attempt IO cath in OR unsuccessful. Cath in PACU without output. Bladder scan with scant volume. Dr. Marlou Sa made aware and they will F/U uop post op. Pt without pain in PACU and L arm flaccid.     No notable events documented.  Last Vitals:  Vitals:   03/18/21 0607 03/18/21 0810  BP: (!) 118/57 136/74  Pulse: 96 91  Resp: 17 17  Temp: 36.8 C 36.7 C  SpO2: 95% 95%    Last Pain:  Vitals:   03/18/21 0847  TempSrc:   PainSc: [place]

## 2021-03-18 NOTE — NC FL2 (Signed)
[place] LEVEL OF CARE SCREENING TOOL     IDENTIFICATION  Patient Name: Travis [name]. Birthdate: 10/02/1950 Sex: male Admission Date (Current Location): 03/17/2021  [facility] and [place] Number:  Herbalist and Address:  [facility] [place]. [facility], [place] [address], [place], [place] [address]      Provider Number: [id number]  Attending Physician Name and Address:  Meredith Pel, MD  Relative Name and Phone Number:  [place], [facility])   [phone]    Current Level of Care: Hospital Recommended Level of Care: [place] Prior Approval Number:    Date Approved/Denied:   PASRR Number: [id number]  Discharge Plan: SNF    Current Diagnoses: Patient Active Problem List   Diagnosis Date Noted   AV block, Mobitz 2 03/18/2021   S/P reverse total shoulder arthroplasty, left 03/17/2021   Iliotibial band syndrome of both sides 03/16/2021   Chronic respiratory failure with hypoxia ([place]) 02/26/2021   Dyspnea 01/14/2021   Sinusitis 11/02/2020   Memory loss or impairment 10/11/2020   Right arm pain 09/18/2020   Aortic atherosclerosis ([place]) 09/16/2020   Spondylolisthesis, lumbar region    Status post lumbar spinal fusion 07/27/2020   Nasal sinus polyp 06/16/2020   Vitamin D deficiency 02/24/2020   Cough 07/29/2019   Wheezing 07/29/2019   Possible exposure to STD 07/24/2019   Unilateral primary osteoarthritis, left hip 06/06/2019   Status post total replacement of left hip 06/06/2019   S/P laparoscopic sleeve gastrectomy 03/03/2019   Rash 01/24/2019   Fever 08/15/2018   UTI (urinary tract infection) 08/15/2018   Fall at home, initial encounter 08/15/2018   Chronic anemia 08/15/2018   Hypokalemia 08/15/2018   Bowel incontinence 07/25/2018   Other spondylosis with radiculopathy, lumbar region 07/16/2018   Status post lumbar laminectomy 07/16/2018   Status post THR (total hip replacement) 04/17/2018    Unilateral primary osteoarthritis, right hip    Morbid obesity with BMI of 40.0-44.9, adult ([place]) 02/28/2018   Myocardial infarction ([place]) 02/25/2018   Coronary artery disease involving native coronary artery of native heart 02/25/2018   PAD (peripheral artery disease) ([place]) 02/25/2018   S/P BKA (below knee amputation) unilateral, right ([place]) 02/25/2018   Sacroiliitis ([place]) 02/25/2018   SOB (shortness of breath) 01/03/2018   Acute on chronic heart failure ([place]) 01/03/2018   Severe right groin pain 12/20/2017   Morbid obesity ([place]) 10/09/2017   Low back pain 07/13/2017   Leukoplakia, tongue 01/26/2017   Skin lesion 10/20/2016   S/P unilateral BKA (below knee amputation), right ([place]) 06/14/2016   Charcot foot due to diabetes mellitus ([place])    Charcot's arthropathy associated with type 2 diabetes mellitus ([place]) 04/11/2016   Encounter for well adult exam with abnormal findings 11/05/2015   Major depression 09/13/2015   S/P TKR (total knee replacement) bilaterally 09/13/2015   GERD (gastroesophageal reflux disease) 09/08/2015   S/P laparoscopic hernia repair 05/11/2015   PPD positive 04/08/2015   Benign neoplasm of descending colon    Benign neoplasm of cecum    Acute blood loss as cause of postoperative anemia    Chronic anticoagulation    Occult blood positive stool 10/17/2014   General weakness 07/14/2014   Urinary incontinence 07/14/2014   Hypotension during surgery 01/30/2014   Headache(784.0) 10/15/2013   Spinal stenosis in cervical region 09/26/2013   Congenital spinal stenosis of lumbar region 09/26/2013   Hand joint pain 06/10/2013   Rotator cuff tear arthropathy of both shoulders 06/10/2013  Skin lesion of cheek 05/01/2013   Pain of right thumb 04/03/2013   Balance disorder 03/12/2013   Gait disorder 03/12/2013   Tremor 03/12/2013   Left hip pain 03/12/2013   Pre-ulcerative corn or callous 02/06/2013   Anxiety 11/12/2011   OSA on CPAP 11/07/2011   Bradycardia  10/20/2011   Insomnia 10/04/2011   Obesity 01/12/2011   Diabetes mellitus type 2 in obese ([place]) 09/27/2010   ERECTILE DYSFUNCTION, ORGANIC 05/30/2010   Degenerative disc disease, lumbar 04/19/2010   SCIATICA, LEFT 04/19/2010   Chronic pain syndrome 10/27/2009   Hyperlipidemia 07/15/2009   Essential hypertension 06/24/2009   Coronary artery disease involving native coronary artery of native heart without angina pectoris 06/24/2009   Allergic rhinitis 06/24/2009   URETHRAL STRICTURE 06/24/2009   DEGENERATIVE JOINT DISEASE 06/24/2009   SHOULDER PAIN, BILATERAL 06/24/2009   FATIGUE 06/24/2009   NEPHROLITHIASIS, HX OF 06/24/2009    Orientation RESPIRATION BLADDER Height & Weight     Time, Situation, Place, Self  Normal Incontinent Weight: 299 lb (135.6 kg) Height:  5' 11"  (180.3 cm)  BEHAVIORAL SYMPTOMS/MOOD NEUROLOGICAL BOWEL NUTRITION STATUS      Continent Diet (see d/c summary)  AMBULATORY STATUS COMMUNICATION OF NEEDS Skin   Limited Assist Verbally Surgical wounds                       Personal Care Assistance Level of Assistance  Bathing, Feeding, Dressing Bathing Assistance: Limited assistance Feeding assistance: Independent Dressing Assistance: Limited assistance     Functional Limitations Info  Speech, Hearing, Sight Sight Info: Adequate Hearing Info: Adequate Speech Info: Adequate    SPECIAL CARE FACTORS FREQUENCY  PT (By licensed PT), OT (By licensed OT)     PT Frequency: 5x/ week OT Frequency: 5x/ week            Contractures Contractures Info: Not present    Additional Factors Info  Code Status, Allergies Code Status Info: Full Allergies Info: NKA           Current Medications (03/18/2021):  This is the current hospital active medication list Current Facility-Administered Medications  Medication Dose Route Frequency Provider Last Rate Last Admin   acetaminophen (TYLENOL) tablet 325-650 mg  325-650 mg Oral Q6H PRN Magnant, Harlyn L, PA-C        acetaminophen (TYLENOL) tablet 500 mg  500 mg Oral Q6H Magnant, Edahi L, PA-C   500 mg at 03/18/21 1216   albuterol (PROVENTIL) (2.5 MG/3ML) 0.083% nebulizer solution 2.5 mg  2.5 mg Inhalation Q6H PRN Magnant, Jamason L, PA-C       ARIPiprazole (ABILIFY) tablet 10 mg  10 mg Oral Daily Magnant, Kriss L, PA-C   10 mg at 03/18/21 0845   aspirin EC tablet 81 mg  81 mg Oral Daily Magnant, Deforest L, PA-C   81 mg at 03/18/21 0846   docusate sodium (COLACE) capsule 100 mg  100 mg Oral BID Magnant, Joshva L, PA-C   100 mg at 03/18/21 0845   donepezil (ARICEPT) tablet 5 mg  5 mg Oral QHS Magnant, Kennan L, PA-C   5 mg at 03/17/21 2114   DULoxetine (CYMBALTA) DR capsule 40 mg  40 mg Oral BID Magnant, Dev L, PA-C   40 mg at 03/18/21 0845   gabapentin (NEURONTIN) capsule 800 mg  800 mg Oral TID Magnant, Jeanclaude L, PA-C   800 mg at 03/18/21 1621   hydrochlorothiazide (HYDRODIURIL) tablet 25 mg  25 mg Oral Daily Magnant, Gerrianne Scale, PA-C  25 mg at 03/18/21 0845   HYDROcodone-acetaminophen (NORCO/VICODIN) 5-325 MG per tablet 1-2 tablet  1-2 tablet Oral Q4H Magnant, Layken L, PA-C   2 tablet at 03/18/21 1621   lactated ringers infusion   Intravenous Continuous Brentlee, Delage, PA-C 75 mL/hr at 03/18/21 0307 New Bag at 03/18/21 0307   lipase/protease/amylase (CREON) capsule 72,000 Units  72,000 Units Oral TID Dillard Essex, MD   72,000 Units at 03/18/21 1621   losartan (COZAAR) tablet 50 mg  50 mg Oral Daily Magnant, Vernel L, PA-C   50 mg at 03/18/21 0845   menthol-cetylpyridinium (CEPACOL) lozenge 3 mg  1 lozenge Oral PRN Magnant, Kainan L, PA-C       Or   phenol (CHLORASEPTIC) mouth spray 1 spray  1 spray Mouth/Throat PRN Magnant, Danford L, PA-C       methocarbamol (ROBAXIN) tablet 500 mg  500 mg Oral Q6H PRN Magnant, Carrell L, PA-C   500 mg at 03/18/21 0845   Or   methocarbamol (ROBAXIN) 500 mg in dextrose 5 % 50 mL IVPB  500 mg Intravenous Q6H PRN Magnant, Jahmani L, PA-C        metoCLOPramide (REGLAN) tablet 5-10 mg  5-10 mg Oral Q8H PRN Magnant, Terell L, PA-C       Or   metoCLOPramide (REGLAN) injection 5-10 mg  5-10 mg Intravenous Q8H PRN Magnant, Roch L, PA-C       mirabegron ER (MYRBETRIQ) tablet 50 mg  50 mg Oral QHS Magnant, Sandro L, PA-C   50 mg at 03/17/21 2114   mometasone-formoterol (DULERA) 200-5 MCG/ACT inhaler 2 puff  2 puff Inhalation BID Magnant, Jahmai L, PA-C   2 puff at 03/18/21 0847   morphine 2 MG/ML injection 0.5-1 mg  0.5-1 mg Intravenous Q2H PRN Magnant, Oluwademilade L, PA-C       ondansetron (ZOFRAN) tablet 4 mg  4 mg Oral Q6H PRN Magnant, Jericho L, PA-C       Or   ondansetron (ZOFRAN) injection 4 mg  4 mg Intravenous Q6H PRN Magnant, Reed L, PA-C       prasugrel (EFFIENT) tablet 5 mg  5 mg Oral Daily Magnant, Keilen L, PA-C   5 mg at 03/18/21 0845   zolpidem (AMBIEN) tablet 5 mg  5 mg Oral QHS PRN Meredith Pel, MD         Discharge Medications: Please see discharge summary for a list of discharge medications.  Relevant Imaging Results:  Relevant Lab Results:   Additional Information SSN # [id number] [place] COVID-19 Vaccine 09/15/2019 , 08/22/2019 -patient states he has received booster shot  Mileah Hemmer F Westyn Driggers, LCSWA

## 2021-03-18 NOTE — Plan of Care (Signed)
  Problem: Health Behavior/Discharge Planning: Goal: Ability to manage health-related needs will improve Outcome: Progressing   

## 2021-03-18 NOTE — TOC Initial Note (Signed)
Transition of Care (TOC) - Initial/Assessment Note    Patient Details  Name: Travis LANNI Sr. MRN: [id number] Date of Birth: 1951/02/12  Transition of Care [facility]) CM/SW Contact:    Milinda Antis, [place] Phone Number: 03/18/2021, 4:29 PM  Clinical Narrative:                  CSW received consult for possible SNF placement at time of discharge. CSW spoke with patient.  Patient expressed understanding of PT recommendation and is agreeable to SNF placement at time of discharge. Patient reports preference for [facility]. CSW discussed insurance authorization process and will provide Medicare SNF ratings list. Patient has received 3 COVID vaccines. Patient expressed being hopeful for rehab and to feel better soon. No further questions reported at this time.   Skilled Nursing Rehab Facilities-   [email or web address]   Ratings out of 5 possible   Name Address  Phone # [place] Inspection Overall  [facility] [address] [place] 5 5 2 4   [facility]  [address] [place], [place] [phone] 4 2 5 5   [facility] [place], [place] 4 1 1 1   [facility] [place], [place] 2 2 4 4   [facility] [address] [place] 2 1 1 1   [place] 3 1 4 3   [facility] [address] [place] 5 2 2 3   [facility] [address] [place] 4 1 2 1   [place] [facility] [place] 5 1 2 2   [facility] [address], [place] [phone] 4 1 1 1   [facility] [address], [facility] [phone] 4 1 2 1   [facility] [address] [place], [place] [phone] 4 1 1 1            Patient Goals and CMS Choice        Expected Discharge Plan and  Services                                                Prior Living Arrangements/Services                       Activities of Daily Living Home Assistive Devices/Equipment: Prosthesis ADL Screening (condition at time of admission) Patient's cognitive ability adequate to safely complete daily activities?: Yes Is the patient deaf or have difficulty hearing?: No Does the patient have difficulty seeing, even when wearing glasses/contacts?: No Does the patient have difficulty concentrating, remembering, or making decisions?: No Patient able to express need for assistance with ADLs?: No Does the patient have difficulty dressing or bathing?: Yes Independently performs ADLs?: Yes (appropriate for developmental age) Does the patient have difficulty walking or climbing stairs?: No Weakness of Legs: Both Weakness of Arms/Hands: Left  Permission Sought/Granted                  Emotional Assessment              Admission diagnosis:  S/P reverse total shoulder arthroplasty, left [V95.638] Patient Active Problem List   Diagnosis Date Noted   AV block, Mobitz 2 03/18/2021   S/P reverse total shoulder arthroplasty, left 03/17/2021   Iliotibial  band syndrome of both sides 03/16/2021   Chronic respiratory failure with hypoxia (HCC) 02/26/2021   Dyspnea 01/14/2021   Sinusitis 11/02/2020   Memory loss or impairment 10/11/2020   Right arm pain 09/18/2020   Aortic atherosclerosis ([place]) 09/16/2020   Spondylolisthesis, lumbar region    Status post lumbar spinal fusion 07/27/2020   Nasal sinus polyp 06/16/2020   Vitamin D deficiency 02/24/2020   Cough 07/29/2019   Wheezing 07/29/2019   Possible exposure to STD 07/24/2019   Unilateral primary osteoarthritis, left hip 06/06/2019   Status post total replacement of left hip 06/06/2019   S/P laparoscopic sleeve gastrectomy 03/03/2019   Rash 01/24/2019   Fever 08/15/2018   UTI (urinary tract infection) 08/15/2018    Fall at home, initial encounter 08/15/2018   Chronic anemia 08/15/2018   Hypokalemia 08/15/2018   Bowel incontinence 07/25/2018   Other spondylosis with radiculopathy, lumbar region 07/16/2018    Class: Chronic   Status post lumbar laminectomy 07/16/2018   Status post THR (total hip replacement) 04/17/2018   Unilateral primary osteoarthritis, right hip    Morbid obesity with BMI of 40.0-44.9, adult ([place]) 02/28/2018   Myocardial infarction ([place]) 02/25/2018   Coronary artery disease involving native coronary artery of native heart 02/25/2018   PAD (peripheral artery disease) ([place]) 02/25/2018   S/P BKA (below knee amputation) unilateral, right ([place]) 02/25/2018   Sacroiliitis ([place]) 02/25/2018   SOB (shortness of breath) 01/03/2018   Acute on chronic heart failure ([place]) 01/03/2018   Severe right groin pain 12/20/2017   Morbid obesity ([place]) 10/09/2017   Low back pain 07/13/2017   Leukoplakia, tongue 01/26/2017   Skin lesion 10/20/2016   S/P unilateral BKA (below knee amputation), right ([place]) 06/14/2016   Charcot foot due to diabetes mellitus ([place])    Charcot's arthropathy associated with type 2 diabetes mellitus ([place]) 04/11/2016   Encounter for well adult exam with abnormal findings 11/05/2015   Major depression 09/13/2015   S/P TKR (total knee replacement) bilaterally 09/13/2015   GERD (gastroesophageal reflux disease) 09/08/2015   S/P laparoscopic hernia repair 05/11/2015   PPD positive 04/08/2015   Benign neoplasm of descending colon    Benign neoplasm of cecum    Acute blood loss as cause of postoperative anemia    Chronic anticoagulation    Occult blood positive stool 10/17/2014   General weakness 07/14/2014   Urinary incontinence 07/14/2014   Hypotension during surgery 01/30/2014    Class: Acute   Headache(784.0) 10/15/2013   Spinal stenosis in cervical region 09/26/2013    Class: Chronic   Congenital spinal stenosis of lumbar region 09/26/2013    Class: Chronic   Hand  joint pain 06/10/2013   Rotator cuff tear arthropathy of both shoulders 06/10/2013   Skin lesion of cheek 05/01/2013   Pain of right thumb 04/03/2013   Balance disorder 03/12/2013   Gait disorder 03/12/2013   Tremor 03/12/2013   Left hip pain 03/12/2013   Pre-ulcerative corn or callous 02/06/2013   Anxiety 11/12/2011   OSA on CPAP 11/07/2011   Bradycardia 10/20/2011   Insomnia 10/04/2011   Obesity 01/12/2011   Diabetes mellitus type 2 in obese ([place]) 09/27/2010   ERECTILE DYSFUNCTION, ORGANIC 05/30/2010   Degenerative disc disease, lumbar 04/19/2010   SCIATICA, LEFT 04/19/2010   Chronic pain syndrome 10/27/2009   Hyperlipidemia 07/15/2009   Essential hypertension 06/24/2009   Coronary artery disease involving native coronary artery of native heart without angina pectoris 06/24/2009   Allergic rhinitis 06/24/2009   URETHRAL STRICTURE 06/24/2009  DEGENERATIVE JOINT DISEASE 06/24/2009   SHOULDER PAIN, BILATERAL 06/24/2009   FATIGUE 06/24/2009   NEPHROLITHIASIS, HX OF 06/24/2009   PCP:  [name], MD Pharmacy:   [facility] [place] [address] [place] [address] Phone: [phone] Fax: [phone]     Social Determinants of Health (SDOH) Interventions    Readmission Risk Interventions No flowsheet data found.

## 2021-03-18 NOTE — Op Note (Signed)
NAME: Travis [name], Travis [name] MEDICAL RECORD NO: [id number] ACCOUNT NO: [id number] DATE OF BIRTH: 12-22-1950 FACILITY: [facility] LOCATION: [facility] PHYSICIAN: Yetta Barre. Marlou Sa, MD  Operative Report   PREOPERATIVE DIAGNOSIS:  Left shoulder arthritis.  POSTOPERATIVE DIAGNOSIS:  Left shoulder arthritis.  PROCEDURE:  Left reverse shoulder replacement.  SURGEON:  Attending Meredith Pel, MD  ASSISTANT:  Annie Main, PA  INDICATIONS:  The patient is a 70 year old patient with left shoulder pain and end-stage arthritis with rotator cuff tearing.  Presents for operative management after explanation of risks and benefits.  IMPLANTS UTILIZED: Biomet medium augmented baseplate with one central compression screw, four peripheral locking screws and 40 mm +3 offset glenosphere with mini humeral stem size 15, with standard polyethylene bearing size 40 and mini humeral tray +5  thickness with +3 taper offset, 40 mm in diameter.  DESCRIPTION OF PROCEDURE:  The patient was brought to the operating room where general endotracheal anesthesia was induced.  Preoperative antibiotics administered.  Timeout was called.  The patient was placed in the beach chair position with head in  neutral position.  Left shoulder prescrubbed with hydrogen peroxide followed by alcohol and Betadine, was allowed to air dry, prepped with ChloraPrep solution, draped in a sterile manner.   A timeout was called.  A deltopectoral approach was made  beginning at the superior aspect of the coracoid process extending 2 cm lateral to the axillary crease.  Skin and subcutaneous tissue were sharply divided.  Cephalic vein mobilized laterally.  A deltopectoral interval was developed.  The deltoid was  elevated at its anterior attachment manually to facilitate exposure.  Next, a Kolbel retractor was placed.  Axillary nerve was palpated.  Next, the biceps tendon was tenodesed with 0 Vicryl sutures.  The biceps tendon proximally was then elevated and  the  rotator interval was opened.  Circumflex vessels were ligated.  The subscap peeled off of the lesser tuberosity.  Then, using electrocautery, with a reversal in place, dissection was performed using the cautery as well as the Cobb elevator down 2 cm  inferior to the humeral neck around to the 7 o'clock position.  Supraspinatus rotator cuff tear was present.  Infraspinatus appeared reasonably intact.  Head was dislocated.  Browne retractor placed.  The head was then reamed up to a size 15.  Cut in 30  degrees of retroversion, which was about the patient's own native version.  Next, broaching performed up to size 15 and then a cap was placed.  At this time, attention was directed towards the glenoid.  The circumferential release of the subscap was  performed.  The Bankart lesion created from the 12 o'clock to 7 o'clock position.  Labrum was excised, posterior and anterior capsule released. patient specific guide was then placed and 2 guide pins were placed.  Bone quality was good.  Next, the glenoid was reamed  to the appropriate depth, using the patient-specific instrumentation, reaming for the medium augment was then performed.  A trial was placed and the patient had very good seating of the implant.  Next, the true implant was placed with excellent contact  obtained with bleeding bone.  Next, one peripheral compression screw, four peripheral locking screws were placed.  A trial was then performed and the 40 mm +3 diameter head with a 1.5 mm inferior offset was ideal.  This was placed, with a good Morse  taper fit obtained.  Next, a trial reduction was performed with multiple combinations, but the most stable combination was the tray  with 3 mm tapered offset.  This gave excellent stability with forward flexion, external rotation, adduction as well as  forward flexion.  True components were then placed on the humeral side after placing tapes for subscap repair.  The same component was placed on the  humeral side.  Thorough irrigation was then performed with both IrriSept solution and 3 liters of pouring  irrigation.  Subscap was then repaired with the arm externally rotated at about 30 degrees of external rotation.  Good repair was achieved.  Vancomycin powder was then placed into the joint.  Next, the deltopectoral interval was closed using #1 Vicryl  suture.  IrriSept solution and vancomycin powder were then placed within this layer as well.  Next, closure performed using 0 Vicryl suture, 2-0 Vicryl suture, and 3-0 Monocryl.  Steri-Strips were applied.  Aquacel dressing applied with a shoulder  immobilizer.  The patient tolerated the procedure well without immediate complications.  Axillary nerve was palpated at closure.  Had appropriate tension with tug test positive.  Luke's assistance was required for retraction, opening, closing,  mobilization of tissue.  His assistance was a medical necessity.   SHW D: 03/17/2021 4:32:35 pm T: 03/18/2021 3:53:00 am  JOB: [id number]/ [id number]

## 2021-03-18 NOTE — Progress Notes (Addendum)
  Subjective:   Patient stable.  Block is starting to wear off.  He has been walking in the hall. Objective: Vital signs in last 24 hours: Temp:  [98 F (36.7 C)-99.4 F (37.4 C)] 99.1 F (37.3 C) (10/28 1350) Pulse Rate:  [70-96] 89 (10/28 1350) Resp:  [12-20] 17 (10/28 1350) BP: (102-156)/(49-85) 122/60 (10/28 1350) SpO2:  [92 %-99 %] 92 % (10/28 1350) Arterial Line BP: (144-151)/(47-77) 151/49 (10/27 1732)  Intake/Output from previous day: 10/27 0701 - 10/28 0700 In: 2100 [P.O.:300; I.V.:1100; IV Piggyback:700] Out: 1500 [Urine:1000; Blood:500] Intake/Output this shift: No intake/output data recorded.  Exam:  No cellulitis present Compartment soft  Labs: Recent Labs    03/17/21 1418 03/18/21 0112  HGB 12.2* 11.1*   Recent Labs    03/17/21 1418 03/18/21 0112  WBC  --  8.9  RBC  --  3.63*  HCT 36.0* 32.4*  PLT  --  234   Recent Labs    03/17/21 1418 03/18/21 0112  NA 138 136  K 3.5 3.9  CL  --  103  CO2  --  27  BUN  --  17  CREATININE  --  1.14  GLUCOSE  --  217*  CALCIUM  --  8.8*   No results for input(s): LABPT, INR in the last 72 hours.  Assessment/Plan: Plan at this time is social work consult for nursing home placement.  Appreciate cardiology input from Dr. Debara Pickett.  We will put him back on Sarahn 2 tablets 3 times a day and okay for Ambien 5 mg nightly as needed which she takes at home.  Anticipate discharge to skilled nursing either Saturday or Monday.  Okay for him to weight-bear through that left shoulder for walker use at this time.  Creatinine and hemoglobin okay at this time  Anticipate discharge to skilled nursing on Monday.   Travis [name] 03/18/2021, 3:41 PM

## 2021-03-18 NOTE — Progress Notes (Signed)
DAILY PROGRESS NOTE   Patient Name: Travis STECKLEIN Sr. Date of Encounter: 03/18/2021 Cardiologist: Lauree Chandler, MD  Chief Complaint   No complaints  Patient Profile   70 yo male with history of CAD and prior PCI, underwent left shoulder surgery today and had intraoperative hypotension  Subjective   No issues overnight. Noted to have intermittent bradycardia - monitor shows periods of Mobitz II, 2:1 AVB with HR in the 40's, but otherwise sinus rhythm in the 70-90's. Suspect he was in heart block prior to the procedure yesterday as pre-op HR's were in the 40's and this is possibly why he became hypotensive during surgery. It was also noted he had sinus brady and junctional rhythm by anesthesia. Echo is pending today. Not currently on an AVN blocking agents. He does have OSA and apparently it is not treated.  Objective   Vitals:   03/17/21 2000 03/17/21 2300 03/18/21 0607 03/18/21 0810  BP: (!) 125/49 (!) 102/58 (!) 118/57 136/74  Pulse: 70 94 96 91  Resp: 18 20 17 17   Temp: 99.4 F (37.4 C)  98.2 F (36.8 C) 98 F (36.7 C)  TempSrc: Oral  Oral Oral  SpO2: 98% 93% 95% 95%  Weight:      Height:        Intake/Output Summary (Last 24 hours) at 03/18/2021 0930 Last data filed at 03/18/2021 0300 Gross per 24 hour  Intake 2100 ml  Output 1500 ml  Net 600 ml   Filed Weights   03/17/21 0857  Weight: 135.6 kg    Physical Exam   General appearance: alert and no distress Neck: no carotid bruit, no JVD, and thyroid not enlarged, symmetric, no tenderness/mass/nodules Lungs: clear to auscultation bilaterally Heart: regular rate and rhythm Abdomen: soft, non-tender; bowel sounds normal; no masses,  no organomegaly Extremities: left shoulder is post-op Pulses: 2+ and symmetric Skin: Skin color, texture, turgor normal. No rashes or lesions Neurologic: Grossly normal Psych: Pleasant  Inpatient Medications    Scheduled Meds:  acetaminophen  500 mg Oral Q6H    ARIPiprazole  10 mg Oral Daily   aspirin EC  81 mg Oral Daily   docusate sodium  100 mg Oral BID   donepezil  5 mg Oral QHS   DULoxetine  40 mg Oral BID   gabapentin  800 mg Oral TID   hydrochlorothiazide  25 mg Oral Daily   HYDROcodone-acetaminophen  1-2 tablet Oral Q4H   lipase/protease/amylase  72,000 Units Oral BID WC   losartan  50 mg Oral Daily   mirabegron ER  50 mg Oral QHS   mometasone-formoterol  2 puff Inhalation BID   prasugrel  5 mg Oral Daily    Continuous Infusions:   ceFAZolin (ANCEF) IV 2 g (03/18/21 0531)   lactated ringers 75 mL/hr at 03/18/21 0307   methocarbamol (ROBAXIN) IV      PRN Meds: acetaminophen, albuterol, menthol-cetylpyridinium **OR** phenol, methocarbamol **OR** methocarbamol (ROBAXIN) IV, metoCLOPramide **OR** metoCLOPramide (REGLAN) injection, morphine injection, ondansetron **OR** ondansetron (ZOFRAN) IV   Labs   Results for orders placed or performed during the hospital encounter of 03/17/21 (from the past 48 hour(s))  SARS Coronavirus 2 by RT PCR (hospital order, performed in [facility] hospital lab) Nasopharyngeal Nasopharyngeal Swab     Status: None   Collection Time: 03/17/21  8:51 AM   Specimen: Nasopharyngeal Swab  Result Value Ref Range   SARS Coronavirus 2 NEGATIVE NEGATIVE    Comment: Performed at [facility], [address] [place], [place] [address]  Glucose, capillary     Status: None   Collection Time: 03/17/21  8:51 AM  Result Value Ref Range   Glucose-Capillary 86 70 - 99 mg/dL    Comment: Glucose reference range applies only to samples taken after fasting for at least 8 hours.  Glucose, capillary     Status: Abnormal   Collection Time: 03/17/21 12:06 PM  Result Value Ref Range   Glucose-Capillary 100 (H) 70 - 99 mg/dL    Comment: Glucose reference range applies only to samples taken after fasting for at least 8 hours.  I-STAT 7, (LYTES, BLD GAS, ICA, H+H)     Status: Abnormal   Collection Time: 03/17/21  2:18  PM  Result Value Ref Range   pH, Arterial 7.360 7.350 - 7.450   pCO2 arterial 48.1 (H) 32.0 - 48.0 mmHg   pO2, Arterial 218 (H) 83.0 - 108.0 mmHg   Bicarbonate 27.2 20.0 - 28.0 mmol/L   TCO2 29 22 - 32 mmol/L   O2 Saturation 100.0 %   Acid-Base Excess 1.0 0.0 - 2.0 mmol/L   Sodium 138 135 - 145 mmol/L   Potassium 3.5 3.5 - 5.1 mmol/L   Calcium, Ion 1.41 (H) 1.15 - 1.40 mmol/L   HCT 36.0 (L) 39.0 - 52.0 %   Hemoglobin 12.2 (L) 13.0 - 17.0 g/dL   Sample type ARTERIAL   Glucose, capillary     Status: Abnormal   Collection Time: 03/17/21  5:23 PM  Result Value Ref Range   Glucose-Capillary 168 (H) 70 - 99 mg/dL    Comment: Glucose reference range applies only to samples taken after fasting for at least 8 hours.  CBC     Status: Abnormal   Collection Time: 03/18/21  1:12 AM  Result Value Ref Range   WBC 8.9 4.0 - 10.5 K/uL   RBC 3.63 (L) 4.22 - 5.81 MIL/uL   Hemoglobin 11.1 (L) 13.0 - 17.0 g/dL   HCT 32.4 (L) 39.0 - 52.0 %   MCV 89.3 80.0 - 100.0 fL   MCH 30.6 26.0 - 34.0 pg   MCHC 34.3 30.0 - 36.0 g/dL   RDW 14.5 11.5 - 15.5 %   Platelets 234 150 - 400 K/uL   nRBC 0.0 0.0 - 0.2 %    Comment: Performed at [facility], [place] [address] [place], [place] [address]  Basic metabolic panel     Status: Abnormal   Collection Time: 03/18/21  1:12 AM  Result Value Ref Range   Sodium 136 135 - 145 mmol/L   Potassium 3.9 3.5 - 5.1 mmol/L   Chloride 103 98 - 111 mmol/L   CO2 27 22 - 32 mmol/L   Glucose, Bld 217 (H) 70 - 99 mg/dL    Comment: Glucose reference range applies only to samples taken after fasting for at least 8 hours.   BUN 17 8 - 23 mg/dL   Creatinine, Ser 1.14 0.61 - 1.24 mg/dL   Calcium 8.8 (L) 8.9 - 10.3 mg/dL   GFR, Estimated >60 >60 mL/min    Comment: (NOTE) Calculated using the CKD-EPI Creatinine Equation (2021)    Anion gap 6 5 - 15    Comment: Performed at [facility] [address] [place],  [address]   *Note: Due to a large number of  results and/or encounters for the requested time period, some results have not been displayed. A complete set of results can be found in  Results Review.    ECG   Not yet obtained - Personally Reviewed  Telemetry   Sinus rhythm with Mobitz II, 2:1 AVB intermittently - HR in the 40's - Personally Reviewed  Radiology    DG Shoulder Left Port  Result Date: 03/17/2021 CLINICAL DATA:  Left shoulder arthroplasty. EXAM: LEFT SHOULDER COMPARISON:  Left shoulder radiograph dated 12/27/2020. FINDINGS: There is a total left shoulder arthroplasty. The arthroplasty components appear intact and aligned. There is no acute fracture or dislocation. There is postsurgical changes with soft tissue air over the left shoulder. IMPRESSION: Total left shoulder arthroplasty. Electronically Signed   By: Anner Crete M.D.   On: 03/17/2021 19:10    Cardiac Studies   Echo pending  Assessment   Active Problems:   Hypotension during surgery   S/P reverse total shoulder arthroplasty, left   AV block, Mobitz 2   Plan   Feels well today.  I discussed the telemetry findings with the patient. EKG ordered - he said the ortho PA noted it was done, but not read - but there is no EKG in the system and I could not find it in the shadow chart. Nonetheless, there was narrow complex asymptomatic AVB overnight. Plan echo today - ultimately, will likely recommend outpatient monitoring to see if he has any more significant heart block that may meet criteria for pacemaker.  Time Spent Directly with Patient:  I have spent a total of 25 minutes with the patient reviewing hospital notes, telemetry, EKGs, labs and examining the patient as well as establishing an assessment and plan that was discussed personally with the patient.  > 50% of time was spent in direct patient care.  Length of Stay:  LOS: 1 day   Pixie Casino, MD, [facility], [place] Director of the [facility]   Cardi[facility] Diplomate of the American Board of Clinical Lipidology Attending Cardiologist  Direct Dial: [phone]  Fax: [phone]  Website:  [email or web address] [email or web address] Lachlyn Vanderstelt 03/18/2021, 9:30 AM

## 2021-03-18 NOTE — Evaluation (Signed)
Physical Therapy Evaluation Patient Details Name: Travis [name] Sr. MRN: [id number] DOB: 06/03/1950 Today's Date: 03/18/2021  History of Present Illness  Pt is 70 yo male s/p L revers TSA on 03/07/21.  Pt with intraoperative hypotension and intermittent bradycardia - cardiology consulted, concerned for heart block and possible need for pacemaker.  Pt  hx including but not limited to R BKA, R ankle fusion, scolsis, back surgeries,  Bil TKA, Bil THA, DM, and HTN   Clinical Impression  Pt admitted with above diagnosis. Pt is normally independent at home and does not have support for mobility.  Today, pt requiring min-mod A for transfers and assist to don prosthetic due to should limitations.  Due to lack of support at home, do recommend SNF at d/c.  Pt currently with functional limitations due to the deficits listed below (see PT Problem List). Pt will benefit from skilled PT to increase their independence and safety with mobility to allow discharge to the venue listed below.          Recommendations for follow up therapy are one component of a multi-disciplinary discharge planning process, led by the attending physician.  Recommendations may be updated based on patient status, additional functional criteria and insurance authorization.  Follow Up Recommendations Skilled nursing-short term rehab (<3 hours/day)    Assistance Recommended at Discharge Intermittent Supervision/Assistance  Functional Status Assessment Patient has had a recent decline in their functional status and demonstrates the ability to make significant improvements in function in a reasonable and predictable amount of time.  Equipment Recommendations  None recommended by PT    Recommendations for Other Services       Precautions / Restrictions Precautions Precautions: Fall Required Braces or Orthoses: Sling Restrictions Weight Bearing Restrictions: Yes LUE Weight Bearing: Weight bear through elbow only (orders for  NWB but did state could use platform RW if needed) Other Position/Activity Restrictions: AROM elbow, hand, wrist; no AROM shoulder; ER shoulder 0-30      Mobility  Bed Mobility Overal bed mobility: Needs Assistance Bed Mobility: Supine to Sit;Sit to Supine     Supine to sit: Min assist Sit to supine: Min assist   General bed mobility comments: Min A to lift trunk (pulling with R UE); cues to scoot forward    Transfers Overall transfer level: Needs assistance Equipment used: Hemi-walker;Straight cane Transfers: Sit to/from Stand Sit to Stand: Min guard;Mod assist           General transfer comment: Min guard from elevated bed but mod A from chair; started hemiwalker progressed cane    Ambulation/Gait Ambulation/Gait assistance: Min guard Gait Distance (Feet): 80 Feet (8' then 34') Assistive device: Hemi-walker;Straight cane Gait Pattern/deviations: Step-through pattern;Wide base of support     General Gait Details: Pt started with hemiwalker but was kicking it with feet.  Switched to cane with improved stability; Cane on R side.  Pt does tend to ambulate at faster speed.  Does have increased lateral sway and wide BOS - somewhat related to scolosis.  Mild instability  Stairs            Wheelchair Mobility    Modified Rankin (Stroke Patients Only)       Balance Overall balance assessment: Needs assistance Sitting-balance support: No upper extremity supported Sitting balance-Leahy Scale: Good Sitting balance - Comments: Did require assist donning R BKA prosthetic due to shoulder - when donning pt leaning back but maintains balance   Standing balance support: Single extremity supported Standing balance-Leahy Scale: Fair  Standing balance comment: could static stand without AD but needs cane to walk                             Pertinent Vitals/Pain Pain Assessment: Faces Faces Pain Scale: Hurts a little bit Pain Location: L shoulder Pain  Descriptors / Indicators: Discomfort Pain Intervention(s): Limited activity within patient's tolerance;Monitored during session    Home Living Family/patient expects to be discharged to:: Skilled nursing facility Living Arrangements: Other relatives Available Help at Discharge: Family;Available PRN/intermittently Type of Home: Mobile home Home Access: Stairs to enter Entrance Stairs-Rails: Left;Right;Can reach both Entrance Stairs-Number of Steps: 4   Home Layout: One level Home Equipment: Conservation officer, nature (2 wheels);Toilet riser;Grab bars - tub/shower Additional Comments: Wants to go to [facility] Prior Level of Function : Independent/Modified Independent             Mobility Comments: Use a cane as needed with prothetic limb. ADLs Comments: ADLs and IADLs. does not drives     Hand Dominance   Dominant Hand: Right    Extremity/Trunk Assessment   Upper Extremity Assessment Upper Extremity Assessment: Defer to OT evaluation    Lower Extremity Assessment Lower Extremity Assessment: LLE deficits/detail;RLE deficits/detail RLE Deficits / Details: R BKA; ROM and MMT WFL LLE Deficits / Details: ROM WFL; MMT 5/5    Cervical / Trunk Assessment Cervical / Trunk Assessment: Back Surgery;Other exceptions Cervical / Trunk Exceptions: Hx of multiple back surgeries; + scolosis  Communication   Communication: No difficulties  Cognition Arousal/Alertness: Awake/alert Behavior During Therapy: WFL for tasks assessed/performed Overall Cognitive Status: Within Functional Limits for tasks assessed                                          General Comments General comments (skin integrity, edema, etc.): VSS    Exercises     Assessment/Plan    PT Assessment Patient needs continued PT services  PT Problem List Decreased strength;Decreased mobility;Decreased range of motion;Decreased knowledge of precautions;Decreased activity  tolerance;Decreased balance;Decreased knowledge of use of DME       PT Treatment Interventions DME instruction;Therapeutic activities;Gait training;Therapeutic exercise;Patient/family education;Balance training;Functional mobility training;Modalities    PT Goals (Current goals can be found in the Care Plan section)  Acute Rehab PT Goals Patient Stated Goal: SNF at d/c then home and return to normal funciotn PT Goal Formulation: With patient Time For Goal Achievement: 04/01/21 Potential to Achieve Goals: Good    Frequency Min 2X/week   Barriers to discharge Decreased caregiver support      Co-evaluation PT/OT/SLP Co-Evaluation/Treatment: Yes Reason for Co-Treatment: Complexity of the patient's impairments (multi-system involvement);For patient/therapist safety;Other (comment) (hx +2 max) PT goals addressed during session: Mobility/safety with mobility OT goals addressed during session: ADL's and self-care;Strengthening/ROM       AM-PAC PT "6 Clicks" Mobility  Outcome Measure Help needed turning from your back to your side while in a flat bed without using bedrails?: A Little Help needed moving from lying on your back to sitting on the side of a flat bed without using bedrails?: A Little Help needed moving to and from a bed to a chair (including a wheelchair)?: A Little Help needed standing up from a chair using your arms (e.g., wheelchair or bedside chair)?: A Lot Help needed to walk in hospital  room?: A Little Help needed climbing 3-5 steps with a railing? : A Lot 6 Click Score: 16    End of Session Equipment Utilized During Treatment: Gait belt;Other (comment) (sling) Activity Tolerance: Patient tolerated treatment well Patient left: in bed;with call bell/phone within reach Nurse Communication: Mobility status PT Visit Diagnosis: Unsteadiness on feet (R26.81);Muscle weakness (generalized) (M62.81)    Time: 1610-9604 PT Time Calculation (min) (ACUTE ONLY): 36  min   Charges:   PT Evaluation $PT Eval Low Complexity: 1 Low          Rosene Pilling, PT Acute Rehab Services Pager [phone] [facility] [place] 03/18/2021, 11:38 AM

## 2021-03-18 NOTE — Evaluation (Signed)
Occupational Therapy Evaluation Patient Details Name: Travis POOLE Sr. MRN: [id number] DOB: 01-10-1951 Today's Date: 03/18/2021   History of Present Illness 70 yo male s/p L revers TSA on 03/07/21.  Pt with intraoperative hypotension and intermittent bradycardia - cardiology consulted, concerned for heart block and possible need for pacemaker.  PMH including R BKA, R ankle fusion, scolsis, back surgeries,  Bil TKA, Bil THA, prior shoulder sx, DM, and HTN   Clinical Impression   PTA, pt was living with his niece (and her family) and was independent with ADLs and IADLs; using a SPC as needed. Pt currently requiring Mod-Max A for UB ADLs, Mod A for LB ADLs (donning his prosthetic), and Min-Mod A for functional transfers. Providing education on shoulder protocol, exercises, sling management, and functional mobility. Pt would benefit from further acute OT to facilitate safe dc, address bathing/dressing, and review exercises. Recommend dc to SNF for further OT to optimize safety, independence with ADLs, and return to PLOF.      Recommendations for follow up therapy are one component of a multi-disciplinary discharge planning process, led by the attending physician.  Recommendations may be updated based on patient status, additional functional criteria and insurance authorization.   Follow Up Recommendations  Skilled nursing-short term rehab (<3 hours/day)    Assistance Recommended at Discharge Intermittent Supervision/Assistance  Functional Status Assessment  Patient has had a recent decline in their functional status and demonstrates the ability to make significant improvements in function in a reasonable and predictable amount of time.  Equipment Recommendations  None recommended by OT    Recommendations for Other Services       Precautions / Restrictions Precautions Precautions: Fall Required Braces or Orthoses: Sling Restrictions Weight Bearing Restrictions: Yes LUE Weight  Bearing: Weight bear through elbow only Other Position/Activity Restrictions: AROM elbow, hand, wrist; no AROM shoulder; ER shoulder 0-30      Mobility Bed Mobility Overal bed mobility: Needs Assistance Bed Mobility: Supine to Sit;Sit to Supine     Supine to sit: Min assist Sit to supine: Min assist   General bed mobility comments: Min A to lift trunk (pulling with R UE); cues to scoot forward    Transfers Overall transfer level: Needs assistance Equipment used: Hemi-walker;Straight cane Transfers: Sit to/from Stand Sit to Stand: Min guard;Mod assist           General transfer comment: Min guard from elevated bed but mod A from chair; started hemiwalker progressed cane      Balance Overall balance assessment: Needs assistance Sitting-balance support: No upper extremity supported Sitting balance-Leahy Scale: Good Sitting balance - Comments: Did require assist donning R BKA prosthetic due to shoulder - when donning pt leaning back but maintains balance   Standing balance support: Single extremity supported Standing balance-Leahy Scale: Fair Standing balance comment: could static stand without AD but needs cane to walk                           ADL either performed or assessed with clinical judgement   ADL Overall ADL's : Needs assistance/impaired Eating/Feeding: Set up;Sitting   Grooming: Set up;Sitting   Upper Body Bathing: Moderate assistance;Sitting   Lower Body Bathing: Moderate assistance;Sit to/from stand   Upper Body Dressing : Maximal assistance;Sitting   Lower Body Dressing: Moderate assistance;Sit to/from stand   Toilet Transfer: Min guard;Moderate assistance Toilet Transfer Details (indicate cue type and reason): Min Guard A from elevted EOB. Mod A for lower  surface         Functional mobility during ADLs: Min guard;Cane General ADL Comments: Pt with decreased balance and strength. Providing education on exercises, restrictions, and  sling management     Vision Baseline Vision/History: 1 Wears glasses       Perception     Praxis      Pertinent Vitals/Pain Pain Assessment: Faces Faces Pain Scale: Hurts a little bit Pain Location: L shoulder Pain Descriptors / Indicators: Discomfort Pain Intervention(s): Monitored during session;Limited activity within patient's tolerance;Repositioned     Hand Dominance Right   Extremity/Trunk Assessment Upper Extremity Assessment Upper Extremity Assessment: RUE deficits/detail;LUE deficits/detail RUE Deficits / Details: Frozen shoulder with limited shoulder ROM RUE: Unable to fully assess due to pain LUE Deficits / Details: S/p shoulder replacement. LUE Coordination: decreased gross motor   Lower Extremity Assessment Lower Extremity Assessment: LLE deficits/detail RLE Deficits / Details: R BKA; ROM and MMT WFL LLE Deficits / Details: ROM WFL; MMT 5/5   Cervical / Trunk Assessment Cervical / Trunk Assessment: Back Surgery;Other exceptions Cervical / Trunk Exceptions: Hx of multiple back surgeries; + scolosis   Communication Communication Communication: No difficulties   Cognition Arousal/Alertness: Awake/alert Behavior During Therapy: WFL for tasks assessed/performed Overall Cognitive Status: Within Functional Limits for tasks assessed                                 General Comments: Very motivated     General Comments  VSS    Exercises Exercises: Hand exercises;Shoulder Shoulder Exercises Pendulum Exercise: Other (comment) (Will need education) Elbow Flexion: AAROM;Left;10 reps;Supine Elbow Extension: AAROM;Left;10 reps;Supine Wrist Flexion: AROM;Left;10 reps;Supine Wrist Extension: PROM;Left;10 reps;Supine Digit Composite Flexion: AROM;Left;10 reps;Supine Composite Extension: AROM;Left;10 reps;Supine Hand Exercises Forearm Supination: AROM;Left;10 reps;Supine Forearm Pronation: AROM;Left;10 reps;Supine   Shoulder Instructions  Shoulder Instructions Donning/doffing shirt without moving shoulder: Maximal assistance Method for sponge bathing under operated UE: Moderate assistance Donning/doffing sling/immobilizer: Maximal assistance Correct positioning of sling/immobilizer: Maximal assistance Pendulum exercises (written home exercise program):  (Will need education) ROM for elbow, wrist and digits of operated UE: Minimal assistance Sling wearing schedule (on at all times/off for ADL's): Supervision/safety Proper positioning of operated UE when showering: Minimal assistance Positioning of UE while sleeping: Minimal assistance    Home Living Family/patient expects to be discharged to:: Skilled nursing facility Living Arrangements: Other relatives (Niece and her family) Available Help at Discharge: Family;Available PRN/intermittently Type of Home: Mobile home Home Access: Stairs to enter Entrance Stairs-Number of Steps: 4 Entrance Stairs-Rails: Left;Right;Can reach both Home Layout: One level     Bathroom Shower/Tub: Teacher, early years/pre: Standard (with riser)     Home Equipment: Conservation officer, nature (2 wheels);Toilet riser;Grab bars - tub/shower   Additional Comments: Wants to go to [facility] at [place]      Prior Functioning/Environment Prior Level of Function : Independent/Modified Independent             Mobility Comments: Use a cane as needed with prothetic limb. ADLs Comments: ADLs and IADLs. does not drives        OT Problem List: Decreased strength;Decreased range of motion;Decreased activity tolerance;Decreased knowledge of use of DME or AE;Decreased knowledge of precautions;Pain;Impaired UE functional use      OT Treatment/Interventions: Self-care/ADL training;Therapeutic exercise;Energy conservation;DME and/or AE instruction;Therapeutic activities;Patient/family education    OT Goals(Current goals can be found in the care plan section) Acute Rehab OT Goals Patient Stated  Goal:  Get stronger at rehab so i can return home safely OT Goal Formulation: With patient Time For Goal Achievement: 04/01/21 Potential to Achieve Goals: Good ADL Goals Pt Will Perform Upper Body Bathing: with supervision;sitting Pt Will Perform Upper Body Dressing: with min assist;sitting Pt Will Perform Lower Body Dressing: with min assist;sit to/from stand Pt Will Transfer to Toilet: with supervision;ambulating;regular height toilet Pt/caregiver will Perform Home Exercise Program: Left upper extremity;With written HEP provided;Independently  OT Frequency: Min 2X/week   Barriers to D/C:            Co-evaluation PT/OT/SLP Co-Evaluation/Treatment: Yes Reason for Co-Treatment: For patient/therapist safety;To address functional/ADL transfers;Complexity of the patient's impairments (multi-system involvement) PT goals addressed during session: Mobility/safety with mobility OT goals addressed during session: ADL's and self-care      AM-PAC OT "6 Clicks" Daily Activity     Outcome Measure Help from another person eating meals?: A Little Help from another person taking care of personal grooming?: A Little Help from another person toileting, which includes using toliet, bedpan, or urinal?: A Lot Help from another person bathing (including washing, rinsing, drying)?: A Lot Help from another person to put on and taking off regular upper body clothing?: A Lot Help from another person to put on and taking off regular lower body clothing?: A Lot 6 Click Score: 14   End of Session Equipment Utilized During Treatment: Gait belt (sling, SPC) Nurse Communication: Mobility status  Activity Tolerance: Patient tolerated treatment well Patient left: in bed;with call bell/phone within reach  OT Visit Diagnosis: Unsteadiness on feet (R26.81);Other abnormalities of gait and mobility (R26.89);Muscle weakness (generalized) (M62.81);Pain Pain - Right/Left: Left Pain - part of body: Shoulder                 Time: 2836-6294 OT Time Calculation (min): 35 min Charges:  OT General Charges $OT Visit: 1 Visit OT Evaluation $OT Eval Moderate Complexity: [place], OTR/L Acute Rehab Pager: [phone] Office: [place] 03/18/2021, 11:55 AM

## 2021-03-18 NOTE — TOC Progression Note (Signed)
Transition of Care (TOC) - Initial/Assessment Note    Patient Details  Name: Travis THIELMAN Sr. MRN: [id number] Date of Birth: Jan 29, 1951  Transition of [facility]) CM/SW Contact:    Milinda Antis, LCSWA Phone Number: 03/18/2021, 4:30 PM  Clinical Narrative:                 16:15-  CSW contacted Juliann Pulse with admissions at [facility].  The facility can accept the patient tomorrow if insurance Josem Kaufmann is received.    16:25- CSW contacted the [facility] CMA's and requested that they begin insurance auth.          Patient Goals and CMS Choice        Expected Discharge Plan and Services                                                Prior Living Arrangements/Services                       Activities of Daily Living Home Assistive Devices/Equipment: Prosthesis ADL Screening (condition at time of admission) Patient's cognitive ability adequate to safely complete daily activities?: Yes Is the patient deaf or have difficulty hearing?: No Does the patient have difficulty seeing, even when wearing glasses/contacts?: No Does the patient have difficulty concentrating, remembering, or making decisions?: No Patient able to express need for assistance with ADLs?: No Does the patient have difficulty dressing or bathing?: Yes Independently performs ADLs?: Yes (appropriate for developmental age) Does the patient have difficulty walking or climbing stairs?: No Weakness of Legs: Both Weakness of Arms/Hands: Left  Permission Sought/Granted                  Emotional Assessment              Admission diagnosis:  S/P reverse total shoulder arthroplasty, left [W88.891] Patient Active Problem List   Diagnosis Date Noted   AV block, [place] 2 03/18/2021   S/P reverse total shoulder arthroplasty, left 03/17/2021   Iliotibial band syndrome of both sides 03/16/2021   Chronic respiratory failure with hypoxia ([place]) 02/26/2021   Dyspnea 01/14/2021    Sinusitis 11/02/2020   Memory loss or impairment 10/11/2020   Right arm pain 09/18/2020   Aortic atherosclerosis ([place]) 09/16/2020   Spondylolisthesis, lumbar region    Status post lumbar spinal fusion 07/27/2020   Nasal sinus polyp 06/16/2020   Vitamin D deficiency 02/24/2020   Cough 07/29/2019   Wheezing 07/29/2019   Possible exposure to STD 07/24/2019   Unilateral primary osteoarthritis, left hip 06/06/2019   Status post total replacement of left hip 06/06/2019   S/P laparoscopic sleeve gastrectomy 03/03/2019   Rash 01/24/2019   Fever 08/15/2018   UTI (urinary tract infection) 08/15/2018   Fall at home, initial encounter 08/15/2018   Chronic anemia 08/15/2018   Hypokalemia 08/15/2018   Bowel incontinence 07/25/2018   Other spondylosis with radiculopathy, lumbar region 07/16/2018    Class: Chronic   Status post lumbar laminectomy 07/16/2018   Status post THR (total hip replacement) 04/17/2018   Unilateral primary osteoarthritis, right hip    Morbid obesity with BMI of 40.0-44.9, adult ([place]) 02/28/2018   Myocardial infarction ([place]) 02/25/2018   Coronary artery disease involving native coronary artery of native heart 02/25/2018   PAD (peripheral artery disease) ([place]) 02/25/2018   S/P BKA (below  knee amputation) unilateral, right ([place]) 02/25/2018   Sacroiliitis ([place]) 02/25/2018   SOB (shortness of breath) 01/03/2018   Acute on chronic heart failure ([place]) 01/03/2018   Severe right groin pain 12/20/2017   Morbid obesity ([place]) 10/09/2017   Low back pain 07/13/2017   Leukoplakia, tongue 01/26/2017   Skin lesion 10/20/2016   S/P unilateral BKA (below knee amputation), right ([place]) 06/14/2016   Charcot foot due to diabetes mellitus ([place])    Charcot's arthropathy associated with type 2 diabetes mellitus ([place]) 04/11/2016   Encounter for well adult exam with abnormal findings 11/05/2015   Major depression 09/13/2015   S/P TKR (total knee replacement) bilaterally 09/13/2015   GERD  (gastroesophageal reflux disease) 09/08/2015   S/P laparoscopic hernia repair 05/11/2015   PPD positive 04/08/2015   Benign neoplasm of descending colon    Benign neoplasm of cecum    Acute blood loss as cause of postoperative anemia    Chronic anticoagulation    Occult blood positive stool 10/17/2014   General weakness 07/14/2014   Urinary incontinence 07/14/2014   Hypotension during surgery 01/30/2014    Class: Acute   Headache(784.0) 10/15/2013   Spinal stenosis in cervical region 09/26/2013    Class: Chronic   Congenital spinal stenosis of lumbar region 09/26/2013    Class: Chronic   Hand joint pain 06/10/2013   Rotator cuff tear arthropathy of both shoulders 06/10/2013   Skin lesion of cheek 05/01/2013   Pain of right thumb 04/03/2013   Balance disorder 03/12/2013   Gait disorder 03/12/2013   Tremor 03/12/2013   Left hip pain 03/12/2013   Pre-ulcerative corn or callous 02/06/2013   Anxiety 11/12/2011   OSA on CPAP 11/07/2011   Bradycardia 10/20/2011   Insomnia 10/04/2011   Obesity 01/12/2011   Diabetes mellitus type 2 in obese ([place]) 09/27/2010   ERECTILE DYSFUNCTION, ORGANIC 05/30/2010   Degenerative disc disease, lumbar 04/19/2010   SCIATICA, LEFT 04/19/2010   Chronic pain syndrome 10/27/2009   Hyperlipidemia 07/15/2009   Essential hypertension 06/24/2009   Coronary artery disease involving native coronary artery of native heart without angina pectoris 06/24/2009   Allergic rhinitis 06/24/2009   URETHRAL STRICTURE 06/24/2009   DEGENERATIVE JOINT DISEASE 06/24/2009   SHOULDER PAIN, BILATERAL 06/24/2009   FATIGUE 06/24/2009   NEPHROLITHIASIS, HX OF 06/24/2009   PCP:  [name], MD Pharmacy:   [place] [place] [address] [place] [address] Phone: [phone] Fax: [phone]     Social Determinants of Health (SDOH) Interventions    Readmission Risk Interventions No flowsheet data  found.

## 2021-03-19 DIAGNOSIS — M19012 Primary osteoarthritis, left shoulder: Secondary | ICD-10-CM

## 2021-03-19 DIAGNOSIS — I441 Atrioventricular block, second degree: Secondary | ICD-10-CM | POA: Diagnosis not present

## 2021-03-19 LAB — CBC
HCT: 31.6 % — ABNORMAL LOW (ref 39.0–52.0)
Hemoglobin: 10.6 g/dL — ABNORMAL LOW (ref 13.0–17.0)
MCH: 30.4 pg (ref 26.0–34.0)
MCHC: 33.5 g/dL (ref 30.0–36.0)
MCV: 90.5 fL (ref 80.0–100.0)
Platelets: 218 10*3/uL (ref 150–400)
RBC: 3.49 MIL/uL — ABNORMAL LOW (ref 4.22–5.81)
RDW: 14.9 % (ref 11.5–15.5)
WBC: 6.6 10*3/uL (ref 4.0–10.5)
nRBC: 0 % (ref 0.0–0.2)

## 2021-03-19 LAB — GLUCOSE, CAPILLARY: Glucose-Capillary: 123 mg/dL — ABNORMAL HIGH (ref 70–99)

## 2021-03-19 NOTE — Progress Notes (Signed)
Patient ID: Travis [name]., male   DOB: 09/07/1950, 70 y.o.   MRN: [id number] Patient is status post reverse left total shoulder arthroplasty.  Plans are for discharge to skilled nursing on Monday.  Patient has no complaints this morning.

## 2021-03-19 NOTE — Progress Notes (Signed)
Progress Note  Patient Name: Travis JUBA Sr. Date of Encounter: 03/19/2021  [facility] Cardiologist: Lauree Chandler, MD   Subjective   No CP or dyspnea; shoulder pain  Inpatient Medications    Scheduled Meds:  ARIPiprazole  10 mg Oral Daily   aspirin EC  81 mg Oral Daily   docusate sodium  100 mg Oral BID   donepezil  5 mg Oral QHS   DULoxetine  40 mg Oral BID   gabapentin  800 mg Oral TID   hydrochlorothiazide  25 mg Oral Daily   HYDROcodone-acetaminophen  1-2 tablet Oral Q4H   lipase/protease/amylase  72,000 Units Oral TID AC   losartan  50 mg Oral Daily   mirabegron ER  50 mg Oral QHS   mometasone-formoterol  2 puff Inhalation BID   prasugrel  5 mg Oral Daily   Continuous Infusions:  lactated ringers 75 mL/hr at 03/18/21 0307   methocarbamol (ROBAXIN) IV     PRN Meds: acetaminophen, albuterol, menthol-cetylpyridinium **OR** phenol, methocarbamol **OR** methocarbamol (ROBAXIN) IV, metoCLOPramide **OR** metoCLOPramide (REGLAN) injection, morphine injection, ondansetron **OR** ondansetron (ZOFRAN) IV, zolpidem   Vital Signs    Vitals:   03/18/21 1350 03/18/21 2016 03/18/21 2043 03/19/21 0700  BP: 122/60 127/62  128/90  Pulse: 89 99 99 88  Resp: 17 16 18 19   Temp: 99.1 F (37.3 C) 98.9 F (37.2 C)  99.6 F (37.6 C)  TempSrc: Oral   Oral  SpO2: 92% 97% 96% 96%  Weight:      Height:        Intake/Output Summary (Last 24 hours) at 03/19/2021 0903 Last data filed at 03/19/2021 0700 Gross per 24 hour  Intake 480 ml  Output --  Net 480 ml   Last 3 Weights 03/17/2021 03/16/2021 03/14/2021  Weight (lbs) 299 lb 299 lb 296 lb 9.6 oz  Weight (kg) 135.626 kg 135.626 kg 134.537 kg  Some encounter information is confidential and restricted. Go to Review Flowsheets activity to see all data.      Telemetry    Normal sinus rhythm with occasional Mobitz 1 second-degree AV block and 2-1 AV block; no prolonged pauses; occasional PVC- Personally  Reviewed  Physical Exam   GEN: No acute distress.  Obese Neck: No JVD Cardiac: RRR, no murmurs, rubs, or gallops.  Respiratory: Clear to auscultation bilaterally. GI: Soft, nontender, non-distended  MS: No edema; status post right BKA, status post shoulder surgery Neuro:  Nonfocal  Psych: Normal affect   Labs    Chemistry Recent Labs  Lab 03/17/21 1418 03/18/21 0112  NA 138 136  K 3.5 3.9  CL  --  103  CO2  --  27  GLUCOSE  --  217*  BUN  --  17  CREATININE  --  1.14  CALCIUM  --  8.8*  GFRNONAA  --  >60  ANIONGAP  --  6     Hematology Recent Labs  Lab 03/17/21 1418 03/18/21 0112 03/19/21 0220  WBC  --  8.9 6.6  RBC  --  3.63* 3.49*  HGB 12.2* 11.1* 10.6*  HCT 36.0* 32.4* 31.6*  MCV  --  89.3 90.5  MCH  --  30.6 30.4  MCHC  --  34.3 33.5  RDW  --  14.5 14.9  PLT  --  234 218    Radiology    DG Shoulder Left Port  Result Date: 03/17/2021 CLINICAL DATA:  Left shoulder arthroplasty. EXAM: LEFT SHOULDER COMPARISON:  Left shoulder radiograph  dated 12/27/2020. FINDINGS: There is a total left shoulder arthroplasty. The arthroplasty components appear intact and aligned. There is no acute fracture or dislocation. There is postsurgical changes with soft tissue air over the left shoulder. IMPRESSION: Total left shoulder arthroplasty. Electronically Signed   By: Anner Crete M.D.   On: 03/17/2021 19:10   ECHOCARDIOGRAM COMPLETE  Result Date: 03/18/2021    ECHOCARDIOGRAM REPORT   Patient Name:   Travis GUINEY Sr. Date of Exam: 03/18/2021 Medical Rec #:  [id number]               Height:       71.0 in Accession #:    [id number]              Weight:       299.0 lb Date of Birth:  1951/05/11                BSA:          2.502 m Patient Age:    70 years                BP:           118/57 mmHg Patient Gender: M                       HR:           99 bpm. Exam Location:  Inpatient Procedure: 2D Echo, Cardiac Doppler and Color Doppler Indications:    Abnl EKG  History:         Patient has prior history of Echocardiogram examinations, most                 recent 01/09/2018. Risk Factors:Hypertension and Diabetes.  Sonographer:    Helmut Muster Referring Phys: [id number] Nadean Corwin HILTY  Sonographer Comments: Patient is morbidly obese. Image acquisition challenging due to patient body habitus. IMPRESSIONS  1. Left ventricular ejection fraction, by estimation, is 65 to 70%. The left ventricle has normal function. The left ventricle has no regional wall motion abnormalities. Left ventricular diastolic parameters were normal.  2. Right ventricular systolic function is normal. The right ventricular size is normal.  3. The mitral valve is grossly normal. No evidence of mitral valve regurgitation.  4. The aortic valve is grossly normal. Aortic valve regurgitation is not visualized. No aortic stenosis is present.  5. Aortic dilatation noted. There is mild dilatation of the ascending aorta, measuring 41 mm. FINDINGS  Left Ventricle: Left ventricular ejection fraction, by estimation, is 65 to 70%. The left ventricle has normal function. The left ventricle has no regional wall motion abnormalities. The left ventricular internal cavity size was normal in size. There is  no left ventricular hypertrophy. Left ventricular diastolic parameters were normal. Right Ventricle: The right ventricular size is normal. Right vetricular wall thickness was not well visualized. Right ventricular systolic function is normal. Left Atrium: Left atrial size was normal in size. Right Atrium: Right atrial size was normal in size. Pericardium: There is no evidence of pericardial effusion. Mitral Valve: The mitral valve is grossly normal. No evidence of mitral valve regurgitation. Tricuspid Valve: The tricuspid valve is grossly normal. Tricuspid valve regurgitation is trivial. Aortic Valve: The aortic valve is grossly normal. Aortic valve regurgitation is not visualized. No aortic stenosis is present. Aortic valve peak  gradient measures 14.9 mmHg. Pulmonic Valve: The pulmonic valve was not well visualized. Pulmonic valve regurgitation is not visualized. Aorta: Aortic dilatation noted. There  is mild dilatation of the ascending aorta, measuring 41 mm. IAS/Shunts: The atrial septum is grossly normal.  LEFT VENTRICLE PLAX 2D LVIDd:         3.80 cm      Diastology LVIDs:         2.60 cm      LV e' medial:    14.00 cm/s LV PW:         1.00 cm      LV E/e' medial:  5.1 LV IVS:        1.00 cm      LV e' lateral:   17.20 cm/s LVOT diam:     2.10 cm      LV E/e' lateral: 4.2 LV SV:         87 LV SV Index:   35 LVOT Area:     3.46 cm  LV Volumes (MOD) LV vol d, MOD A2C: 97.6 ml LV vol d, MOD A4C: 115.0 ml LV vol s, MOD A2C: 39.0 ml LV vol s, MOD A4C: 45.1 ml LV SV MOD A2C:     58.6 ml LV SV MOD A4C:     115.0 ml LV SV MOD BP:      65.5 ml RIGHT VENTRICLE             IVC RV S prime:     25.40 cm/s  IVC diam: 1.70 cm TAPSE (M-mode): 3.2 cm LEFT ATRIUM             Index        RIGHT ATRIUM           Index LA diam:        3.80 cm 1.52 cm/m   RA Area:     18.80 cm LA Vol (A2C):   55.7 ml 22.27 ml/m  RA Volume:   53.70 ml  21.47 ml/m LA Vol (A4C):   70.8 ml 28.30 ml/m LA Biplane Vol: 63.7 ml 25.46 ml/m  AORTIC VALVE AV Area (Vmax): 2.94 cm AV Vmax:        193.00 cm/s AV Peak Grad:   14.9 mmHg LVOT Vmax:      164.00 cm/s LVOT Vmean:     111.000 cm/s LVOT VTI:       0.252 m  AORTA Ao Root diam: 3.50 cm Ao Asc diam:  4.10 cm MITRAL VALVE               TRICUSPID VALVE MV Area (PHT): 6.02 cm    TR Peak grad:   7.8 mmHg MV Decel Time: 126 msec    TR Vmax:        140.00 cm/s MV E velocity: 71.40 cm/s MV A velocity: 98.30 cm/s  SHUNTS MV E/A ratio:  0.73        Systemic VTI:  0.25 m                            Systemic Diam: 2.10 cm Mertie Moores MD Electronically signed by Mertie Moores MD Signature Date/Time: 03/18/2021/4:26:03 PM    Final     Patient Profile     70 y.o. male with past medical history of coronary artery disease,  hypertension, hyperlipidemia and status post shoulder surgery being evaluated for intraoperative hypotension and bradycardia.  Assessment & Plan    1 bradycardia-telemetry has been reviewed.  Over the past 24 hours he has had occasional Mobitz 1 second-degree AV block and occasional 2-1  AV block but no prolonged pauses and he has no history of syncope.  No indication for further treatment.  Avoid AV nodal blocking agents.  2 coronary artery disease-continue aspirin.  Would resume preadmission dose of Lipitor at discharge.  Resume prasugrel at preadmission dose.  3 hypertension-patient's blood pressure is controlled.  Continue present medications at discharge.  We will sign off.  Telemetry can be DC'd.  Please call with questions.  Would resume all preadmission cardiac medications at discharge.  I will arrange follow-up in 6 to 8 weeks.  For questions or updates, please contact [facility] Please consult [email or web address] for contact info under        Signed, Kirk Ruths, MD  03/19/2021, 9:03 AM

## 2021-03-19 NOTE — Plan of Care (Signed)
  Problem: Education: Goal: Knowledge of General Education information will improve Description: Including pain rating scale, medication(s)/side effects and non-pharmacologic comfort measures Outcome: Progressing   Problem: Activity: Goal: Risk for activity intolerance will decrease Outcome: Progressing   Problem: Pain Managment: Goal: General experience of comfort will improve Outcome: Progressing   

## 2021-03-19 NOTE — Plan of Care (Signed)
  Problem: Activity: Goal: Risk for activity intolerance will decrease Outcome: Progressing   Problem: Pain Managment: Goal: General experience of comfort will improve Outcome: Progressing   Problem: Safety: Goal: Ability to remain free from injury will improve Outcome: Progressing   

## 2021-03-19 NOTE — TOC Progression Note (Addendum)
Transition of Care (TOC) - Progression Note    Patient Details  Name: Travis [name]. MRN: [id number] Date of Birth: 19-May-1951  Transition of Care [facility]) CM/SW Contact  Joanne Chars, LCSW Phone Number: 03/19/2021, 2:04 PM  Clinical Narrative:   SNF auth approved in [place].  Plan Auth ID: [id number], Ref: [id number].  5 days, 10/28-10/31.  CSW spoke with Juliann Pulse at [facility] and they can receive pt today.  Covid test negative on 10/27--would be good through today.  Attempting to reach MD to see if pt medically ready.   1445: TC Dr Sharol Given.  No paper script available for pain medication, plan to DC tomorrow.  Juliann Pulse at [facility] informed.            Expected Discharge Plan and Services                                                 Social Determinants of Health (SDOH) Interventions    Readmission Risk Interventions No flowsheet data found.

## 2021-03-19 NOTE — Progress Notes (Signed)
Occupational Therapy Treatment Patient Details Name: Travis [name] Sr. MRN: [id number] DOB: October 27, 1950 Today's Date: 03/19/2021   History of present illness 70 yo male s/p L revers TSA on 03/07/21.  Pt with intraoperative hypotension and intermittent bradycardia - cardiology consulted, concerned for heart block and possible need for pacemaker.  PMH including R BKA, R ankle fusion, scolsis, back surgeries,  Bil TKA, Bil THA, prior shoulder sx, DM, and HTN   OT comments  Patient received in bed and agreeable to OT session but did not want to address pendulum swings due to pain. Patient required min assist and increased time to get to eob and patient performed grooming seated on eob. AAROM to LUE elbow for flexion/extension for 3 sets of 10 and one set of AROM for LUE wrist and hand. Patient returned to supine with min assist and ice pack provided. Acute OT to continue to follow.    Recommendations for follow up therapy are one component of a multi-disciplinary discharge planning process, led by the attending physician.  Recommendations may be updated based on patient status, additional functional criteria and insurance authorization.    Follow Up Recommendations  Skilled nursing-short term rehab (<3 hours/day)    Assistance Recommended at Discharge Intermittent Supervision/Assistance  Equipment Recommendations  None recommended by OT    Recommendations for Other Services      Precautions / Restrictions Precautions Precautions: Fall Required Braces or Orthoses: Sling Restrictions Weight Bearing Restrictions: Yes LUE Weight Bearing: Weight bear through elbow only Other Position/Activity Restrictions: AROM elbow, hand, wrist; no AROM shoulder; ER shoulder 0-30       Mobility Bed Mobility Overal bed mobility: Needs Assistance Bed Mobility: Supine to Sit;Sit to Supine     Supine to sit: Min assist Sit to supine: Min assist   General bed mobility comments: min assist for trunk  and to shift hips    Transfers                         Balance Overall balance assessment: Needs assistance Sitting-balance support: No upper extremity supported Sitting balance-Leahy Scale: Good Sitting balance - Comments: performed grooming and AROM for LUE elbow and hand while seated on eob                                   ADL either performed or assessed with clinical judgement   ADL Overall ADL's : Needs assistance/impaired     Grooming: Set up;Sitting Grooming Details (indicate cue type and reason): performed seated on eob                                     Vision       Perception     Praxis      Cognition Arousal/Alertness: Awake/alert Behavior During Therapy: WFL for tasks assessed/performed Overall Cognitive Status: Within Functional Limits for tasks assessed                                 General Comments: motivated to perform OT but did not want to address pendulum swings today          Exercises Exercises: General Upper Extremity Shoulder Exercises Elbow Flexion: AAROM;Left;10 reps;Seated Elbow Extension: AAROM;Left;10 reps;Seated Wrist Flexion: AROM;10 reps;Left;Seated Wrist Extension:  AROM;Left;10 reps;Seated Digit Composite Flexion: AROM;Left;10 reps;Seated Composite Extension: AROM;Left;10 reps;Seated   Shoulder Instructions       General Comments      Pertinent Vitals/ Pain       Pain Assessment: Faces Faces Pain Scale: Hurts little more Pain Location: L shoulder Pain Descriptors / Indicators: Discomfort;Grimacing Pain Intervention(s): Limited activity within patient's tolerance;Monitored during session;Premedicated before session  Home Living                                          Prior Functioning/Environment              Frequency  Min 2X/week        Progress Toward Goals  OT Goals(current goals can now be found in the care plan section)   Progress towards OT goals: Progressing toward goals  Acute Rehab OT Goals Patient Stated Goal: move arm OT Goal Formulation: With patient Time For Goal Achievement: 04/01/21 Potential to Achieve Goals: Good ADL Goals Pt Will Perform Upper Body Bathing: with supervision;sitting Pt Will Perform Upper Body Dressing: with min assist;sitting Pt Will Perform Lower Body Dressing: with min assist;sit to/from stand Pt Will Transfer to Toilet: with supervision;ambulating;regular height toilet Pt/caregiver will Perform Home Exercise Program: Left upper extremity;With written HEP provided;Independently  Plan Discharge plan remains appropriate    Co-evaluation                 AM-PAC OT "6 Clicks" Daily Activity     Outcome Measure   Help from another person eating meals?: A Little Help from another person taking care of personal grooming?: A Little Help from another person toileting, which includes using toliet, bedpan, or urinal?: A Lot Help from another person bathing (including washing, rinsing, drying)?: A Lot Help from another person to put on and taking off regular upper body clothing?: A Lot Help from another person to put on and taking off regular lower body clothing?: A Lot 6 Click Score: 14    End of Session Equipment Utilized During Treatment:  (sling)  OT Visit Diagnosis: Unsteadiness on feet (R26.81);Other abnormalities of gait and mobility (R26.89);Muscle weakness (generalized) (M62.81);Pain Pain - Right/Left: Left Pain - part of body: Shoulder   Activity Tolerance Patient tolerated treatment well   Patient Left in bed;with call bell/phone within reach   Nurse Communication Mobility status        Time: 8099-8338 OT Time Calculation (min): 31 min  Charges: OT General Charges $OT Visit: 1 Visit OT Treatments $Self Care/Home Management : 8-22 mins $Therapeutic Exercise: 8-22 mins  Lodema Hong, OTA Acute Rehabilitation Services  Pager [phone] Office  [place] 03/19/2021, 10:09 AM

## 2021-03-20 DIAGNOSIS — G4700 Insomnia, unspecified: Secondary | ICD-10-CM | POA: Diagnosis not present

## 2021-03-20 DIAGNOSIS — Z981 Arthrodesis status: Secondary | ICD-10-CM | POA: Diagnosis not present

## 2021-03-20 DIAGNOSIS — M25512 Pain in left shoulder: Secondary | ICD-10-CM | POA: Diagnosis not present

## 2021-03-20 DIAGNOSIS — M4326 Fusion of spine, lumbar region: Secondary | ICD-10-CM | POA: Diagnosis not present

## 2021-03-20 DIAGNOSIS — Z471 Aftercare following joint replacement surgery: Secondary | ICD-10-CM | POA: Diagnosis not present

## 2021-03-20 DIAGNOSIS — Z96612 Presence of left artificial shoulder joint: Secondary | ICD-10-CM | POA: Diagnosis not present

## 2021-03-20 DIAGNOSIS — T8131XA Disruption of external operation (surgical) wound, not elsewhere classified, initial encounter: Secondary | ICD-10-CM | POA: Diagnosis not present

## 2021-03-20 DIAGNOSIS — M4802 Spinal stenosis, cervical region: Secondary | ICD-10-CM | POA: Diagnosis not present

## 2021-03-20 DIAGNOSIS — M797 Fibromyalgia: Secondary | ICD-10-CM | POA: Diagnosis not present

## 2021-03-20 DIAGNOSIS — T24212A Burn of second degree of left thigh, initial encounter: Secondary | ICD-10-CM | POA: Diagnosis not present

## 2021-03-20 DIAGNOSIS — Z96642 Presence of left artificial hip joint: Secondary | ICD-10-CM | POA: Diagnosis not present

## 2021-03-20 DIAGNOSIS — I110 Hypertensive heart disease with heart failure: Secondary | ICD-10-CM | POA: Diagnosis not present

## 2021-03-20 DIAGNOSIS — Z89511 Acquired absence of right leg below knee: Secondary | ICD-10-CM | POA: Diagnosis not present

## 2021-03-20 DIAGNOSIS — Z743 Need for continuous supervision: Secondary | ICD-10-CM | POA: Diagnosis not present

## 2021-03-20 DIAGNOSIS — G8929 Other chronic pain: Secondary | ICD-10-CM | POA: Diagnosis not present

## 2021-03-20 DIAGNOSIS — R5381 Other malaise: Secondary | ICD-10-CM | POA: Diagnosis not present

## 2021-03-20 DIAGNOSIS — M6281 Muscle weakness (generalized): Secondary | ICD-10-CM | POA: Diagnosis not present

## 2021-03-20 DIAGNOSIS — M19012 Primary osteoarthritis, left shoulder: Secondary | ICD-10-CM | POA: Diagnosis not present

## 2021-03-20 DIAGNOSIS — Z1159 Encounter for screening for other viral diseases: Secondary | ICD-10-CM | POA: Diagnosis not present

## 2021-03-20 DIAGNOSIS — R2689 Other abnormalities of gait and mobility: Secondary | ICD-10-CM | POA: Diagnosis not present

## 2021-03-20 DIAGNOSIS — M1612 Unilateral primary osteoarthritis, left hip: Secondary | ICD-10-CM | POA: Diagnosis not present

## 2021-03-20 DIAGNOSIS — Z4889 Encounter for other specified surgical aftercare: Secondary | ICD-10-CM | POA: Diagnosis not present

## 2021-03-20 DIAGNOSIS — G894 Chronic pain syndrome: Secondary | ICD-10-CM | POA: Diagnosis not present

## 2021-03-20 DIAGNOSIS — N35819 Other urethral stricture, male, unspecified site: Secondary | ICD-10-CM | POA: Diagnosis not present

## 2021-03-20 DIAGNOSIS — M4726 Other spondylosis with radiculopathy, lumbar region: Secondary | ICD-10-CM | POA: Diagnosis not present

## 2021-03-20 DIAGNOSIS — M48061 Spinal stenosis, lumbar region without neurogenic claudication: Secondary | ICD-10-CM | POA: Diagnosis not present

## 2021-03-20 LAB — SARS CORONAVIRUS 2 (TAT 6-24 HRS): SARS Coronavirus 2: NEGATIVE

## 2021-03-20 MED ORDER — DOCUSATE SODIUM 100 MG PO CAPS: 100.0000 mg | ORAL_CAPSULE | Freq: Two times a day (BID) | ORAL | 0 refills | Status: DC

## 2021-03-20 MED ORDER — HYDROCODONE-ACETAMINOPHEN 10-325 MG PO TABS: 1.0000 | ORAL_TABLET | Freq: Four times a day (QID) | ORAL | 0 refills | Status: DC | PRN

## 2021-03-20 MED ORDER — HYDROCODONE-ACETAMINOPHEN 10-325 MG PO TABS: 1.0000 | ORAL_TABLET | ORAL | 0 refills | Status: DC | PRN

## 2021-03-20 MED ORDER — ZOLPIDEM TARTRATE 5 MG PO TABS: 5.0000 mg | ORAL_TABLET | Freq: Every evening | ORAL | 0 refills | Status: DC | PRN

## 2021-03-20 NOTE — Progress Notes (Signed)
AVS, signed printed prescriptions, social worker's paperwork placed in discharge packet. Report called and given to Raynelle Fanning, Therapist, sports at [employer]. All questions answered to satisfaction. [facility] to transport pt with all belongings.

## 2021-03-20 NOTE — Discharge Summary (Signed)
Discharge Diagnoses:  Active Problems:   Hypotension during surgery   S/P reverse total shoulder arthroplasty, left   AV block, Mobitz 2   Arthritis of left shoulder region   Surgeries: Procedure(s): LEFT REVERSE SHOULDER ARTHROPLASTY on 03/17/2021    Consultants:   Discharged Condition: Improved  Hospital Course: Travis [name]. is an 70 y.o. male who was admitted 03/17/2021 with a chief complaint of osteoarthritis left shoulder, with a final diagnosis of left shoulder osteoarthritis.  Patient was brought to the operating room on 03/17/2021 and underwent Procedure(s): LEFT REVERSE SHOULDER ARTHROPLASTY.    Patient was given perioperative antibiotics:  Anti-infectives (From admission, onward)    Start     Dose/Rate Route Frequency Ordered Stop   03/17/21 2100  ceFAZolin (ANCEF) IVPB 2g/100 mL premix        2 g 200 mL/hr over 30 Minutes Intravenous Every 8 hours 03/17/21 1839 03/18/21 1246   03/17/21 1342  vancomycin (VANCOCIN) powder  Status:  Discontinued          As needed 03/17/21 1342 03/17/21 1642   03/17/21 0900  ceFAZolin (ANCEF) IVPB 3g/100 mL premix        3 g 200 mL/hr over 30 Minutes Intravenous On call to O.R. 03/17/21 5993 03/17/21 1317     .  Patient was given sequential compression devices, early ambulation, and aspirin for DVT prophylaxis.  Recent vital signs: Patient Vitals for the past 24 hrs:  BP Temp Temp src Pulse Resp SpO2  03/20/21 0743 (!) 151/87 98.3 F (36.8 C) Oral 84 17 92 %  03/19/21 2042 -- -- -- 87 18 97 %  03/19/21 2036 111/63 -- -- 90 16 97 %  03/19/21 1300 127/60 98.8 F (37.1 C) Oral 87 18 96 %  .  Recent laboratory studies: ECHOCARDIOGRAM COMPLETE  Result Date: 03/18/2021    ECHOCARDIOGRAM REPORT   Patient Name:   Travis CARRERAS Sr. Date of Exam: 03/18/2021 Medical Rec #:  [id number]               Height:       71.0 in Accession #:    [id number]              Weight:       299.0 lb Date of Birth:  1950-07-09                 BSA:          2.502 m Patient Age:    89 years                BP:           118/57 mmHg Patient Gender: M                       HR:           99 bpm. Exam Location:  Inpatient Procedure: 2D Echo, Cardiac Doppler and Color Doppler Indications:    Abnl EKG  History:        Patient has prior history of Echocardiogram examinations, most                 recent 01/09/2018. Risk Factors:Hypertension and Diabetes.  Sonographer:    Helmut Muster Referring Phys: [id number] Nadean Corwin HILTY  Sonographer Comments: Patient is morbidly obese. Image acquisition challenging due to patient body habitus. IMPRESSIONS  1. Left ventricular ejection fraction, by estimation, is 65 to 70%. The left ventricle has normal  function. The left ventricle has no regional wall motion abnormalities. Left ventricular diastolic parameters were normal.  2. Right ventricular systolic function is normal. The right ventricular size is normal.  3. The mitral valve is grossly normal. No evidence of mitral valve regurgitation.  4. The aortic valve is grossly normal. Aortic valve regurgitation is not visualized. No aortic stenosis is present.  5. Aortic dilatation noted. There is mild dilatation of the ascending aorta, measuring 41 mm. FINDINGS  Left Ventricle: Left ventricular ejection fraction, by estimation, is 65 to 70%. The left ventricle has normal function. The left ventricle has no regional wall motion abnormalities. The left ventricular internal cavity size was normal in size. There is  no left ventricular hypertrophy. Left ventricular diastolic parameters were normal. Right Ventricle: The right ventricular size is normal. Right vetricular wall thickness was not well visualized. Right ventricular systolic function is normal. Left Atrium: Left atrial size was normal in size. Right Atrium: Right atrial size was normal in size. Pericardium: There is no evidence of pericardial effusion. Mitral Valve: The mitral valve is grossly normal. No evidence of mitral  valve regurgitation. Tricuspid Valve: The tricuspid valve is grossly normal. Tricuspid valve regurgitation is trivial. Aortic Valve: The aortic valve is grossly normal. Aortic valve regurgitation is not visualized. No aortic stenosis is present. Aortic valve peak gradient measures 14.9 mmHg. Pulmonic Valve: The pulmonic valve was not well visualized. Pulmonic valve regurgitation is not visualized. Aorta: Aortic dilatation noted. There is mild dilatation of the ascending aorta, measuring 41 mm. IAS/Shunts: The atrial septum is grossly normal.  LEFT VENTRICLE PLAX 2D LVIDd:         3.80 cm      Diastology LVIDs:         2.60 cm      LV e' medial:    14.00 cm/s LV PW:         1.00 cm      LV E/e' medial:  5.1 LV IVS:        1.00 cm      LV e' lateral:   17.20 cm/s LVOT diam:     2.10 cm      LV E/e' lateral: 4.2 LV SV:         87 LV SV Index:   35 LVOT Area:     3.46 cm  LV Volumes (MOD) LV vol d, MOD A2C: 97.6 ml LV vol d, MOD A4C: 115.0 ml LV vol s, MOD A2C: 39.0 ml LV vol s, MOD A4C: 45.1 ml LV SV MOD A2C:     58.6 ml LV SV MOD A4C:     115.0 ml LV SV MOD BP:      65.5 ml RIGHT VENTRICLE             IVC RV S prime:     25.40 cm/s  IVC diam: 1.70 cm TAPSE (M-mode): 3.2 cm LEFT ATRIUM             Index        RIGHT ATRIUM           Index LA diam:        3.80 cm 1.52 cm/m   RA Area:     18.80 cm LA Vol (A2C):   55.7 ml 22.27 ml/m  RA Volume:   53.70 ml  21.47 ml/m LA Vol (A4C):   70.8 ml 28.30 ml/m LA Biplane Vol: 63.7 ml 25.46 ml/m  AORTIC VALVE AV Area (Vmax): 2.94 cm  AV Vmax:        193.00 cm/s AV Peak Grad:   14.9 mmHg LVOT Vmax:      164.00 cm/s LVOT Vmean:     111.000 cm/s LVOT VTI:       0.252 m  AORTA Ao Root diam: 3.50 cm Ao Asc diam:  4.10 cm MITRAL VALVE               TRICUSPID VALVE MV Area (PHT): 6.02 cm    TR Peak grad:   7.8 mmHg MV Decel Time: 126 msec    TR Vmax:        140.00 cm/s MV E velocity: 71.40 cm/s MV A velocity: 98.30 cm/s  SHUNTS MV E/A ratio:  0.73        Systemic VTI:  0.25 m                             Systemic Diam: 2.10 cm Mertie Moores MD Electronically signed by Mertie Moores MD Signature Date/Time: 03/18/2021/4:26:03 PM    Final     Discharge Medications:   Allergies as of 03/20/2021   No Known Allergies      Medication List     STOP taking these medications    buprenorphine 20 MCG/HR Ptwk Commonly known as: BUTRANS   celecoxib 200 MG capsule Commonly known as: CELEBREX   Fish Oil 1000 MG Caps   predniSONE 10 MG tablet Commonly known as: DELTASONE       TAKE these medications    albuterol 108 (90 Base) MCG/ACT inhaler Commonly known as: VENTOLIN HFA Inhale 2 puffs into the lungs every 6 (six) hours as needed for wheezing or shortness of breath.   ARIPiprazole 10 MG tablet Commonly known as: ABILIFY Take 1 tablet (10 mg total) by mouth daily.   aspirin EC 81 MG tablet Take 81 mg by mouth at bedtime. Swallow whole.   atorvastatin 20 MG tablet Commonly known as: LIPITOR Take 1 tablet (20 mg total) by mouth daily.   Azelastine-Fluticasone 137-50 MCG/ACT Susp Use both nostrils as directed twice daily   benztropine 0.5 MG tablet Commonly known as: COGENTIN Take 1 tablet (0.5 mg total) by mouth at bedtime.   budesonide-formoterol 160-4.5 MCG/ACT inhaler Commonly known as: SYMBICORT Inhale 2 puffs into the lungs 2 (two) times daily.   calcium carbonate 500 MG chewable tablet Commonly known as: TUMS - dosed in mg elemental calcium Chew 1 tablet by mouth 3 (three) times daily with meals.   cetirizine 10 MG tablet Commonly known as: ZYRTEC Take 1 tablet (10 mg total) by mouth daily.   diclofenac Sodium 1 % Gel Commonly known as: VOLTAREN Apply 4 g topically 3 (three) times daily as needed (pain).   docusate sodium 100 MG capsule Commonly known as: COLACE Take 1 capsule (100 mg total) by mouth 2 (two) times daily.   donepezil 5 MG tablet Commonly known as: ARICEPT Take 1 tablet (5 mg total) by mouth at bedtime.   DULoxetine  20 MG capsule Commonly known as: CYMBALTA Take 40 mg by mouth in the morning and at bedtime.   DULoxetine HCl 40 MG Cpep Take 1 capsule (58m total) by mouth daily.   fluticasone 50 MCG/ACT nasal spray Commonly known as: FLONASE Place 2 sprays into both nostrils in the morning and at bedtime.   gabapentin 800 MG tablet Commonly known as: NEURONTIN Take 800 mg by mouth 3 (three) times daily.  hydrochlorothiazide 25 MG tablet Commonly known as: HYDRODIURIL Take 1 tablet (25 mg total) by mouth daily.   HYDROcodone-acetaminophen 10-325 MG tablet Commonly known as: NORCO Take 1 tablet by mouth every 6 (six) hours as needed. What changed:  when to take this reasons to take this   HYDROcodone-acetaminophen 10-325 MG tablet Commonly known as: NORCO Take 1 tablet by mouth every 4 (four) hours as needed for moderate pain. What changed: You were already taking a medication with the same name, and this prescription was added. Make sure you understand how and when to take each.   IRON PO Take by mouth.   losartan 50 MG tablet Commonly known as: COZAAR Take 1 tablet (50 mg total) by mouth daily.   melatonin 5 MG Tabs Take 15 mg by mouth at bedtime.   methocarbamol 500 MG tablet Commonly known as: ROBAXIN Take 1 tablet (500 mg total) by mouth every 8 (eight) hours as needed for muscle spasms.   Myrbetriq 50 MG Tb24 tablet Generic drug: mirabegron ER Take 50 mg by mouth at bedtime.   phentermine 37.5 MG capsule Take 1 capsule (37.5 mg total) by mouth every morning.   prasugrel 5 MG Tabs tablet Commonly known as: EFFIENT Take 1 tablet (5 mg total) by mouth daily.   tamsulosin 0.4 MG Caps capsule Commonly known as: FLOMAX Take 0.4 mg by mouth 2 (two) times daily.   Testosterone 1.62 % Gel Place 1 Pump onto the skin daily. What changed: Another medication with the same name was removed. Continue taking this medication, and follow the directions you see here.   traZODone  150 MG tablet Commonly known as: DESYREL Take 1 tablet (150 mg total) by mouth at bedtime.   VITAMIN D3 PO Take 2 tablets by mouth at bedtime.   Zenpep 40000-126000 units Cpep Generic drug: Pancrelipase (Lip-Prot-Amyl) Take 2 capsules (80,000 units) with meals and 1 capsule (40,000 units) with each snack. PLEASE SCHEDULE A YEARLY FOLLOW UP FOR FURTHER REFILLS. Thank you   zolpidem 5 MG tablet Commonly known as: AMBIEN Take 1 tablet (5 mg total) by mouth at bedtime as needed for sleep. What changed:  medication strength how much to take   zolpidem 5 MG tablet Commonly known as: Ambien Take 1 tablet (5 mg total) by mouth at bedtime as needed for sleep. What changed: You were already taking a medication with the same name, and this prescription was added. Make sure you understand how and when to take each.               Durable Medical Equipment  (From admission, onward)           Start     Ordered   03/17/21 1840  For home use only DME Walker platform  Once       Question:  Patient needs a walker to treat with the following condition  Answer:  S/P reverse total shoulder arthroplasty, left   03/17/21 1839            Diagnostic Studies: DG Shoulder Left Port  Result Date: 03/17/2021 CLINICAL DATA:  Left shoulder arthroplasty. EXAM: LEFT SHOULDER COMPARISON:  Left shoulder radiograph dated 12/27/2020. FINDINGS: There is a total left shoulder arthroplasty. The arthroplasty components appear intact and aligned. There is no acute fracture or dislocation. There is postsurgical changes with soft tissue air over the left shoulder. IMPRESSION: Total left shoulder arthroplasty. Electronically Signed   By: Anner Crete M.D.   On: 03/17/2021 19:10  ECHOCARDIOGRAM COMPLETE  Result Date: 03/18/2021    ECHOCARDIOGRAM REPORT   Patient Name:   Travis [name] Sr. Date of Exam: 03/18/2021 Medical Rec #:  [id number]               Height:       71.0 in Accession #:     [id number]              Weight:       299.0 lb Date of Birth:  1950/09/27                BSA:          2.502 m Patient Age:    32 years                BP:           118/57 mmHg Patient Gender: M                       HR:           99 bpm. Exam Location:  Inpatient Procedure: 2D Echo, Cardiac Doppler and Color Doppler Indications:    Abnl EKG  History:        Patient has prior history of Echocardiogram examinations, most                 recent 01/09/2018. Risk Factors:Hypertension and Diabetes.  Sonographer:    Helmut Muster Referring Phys: [id number] Nadean Corwin HILTY  Sonographer Comments: Patient is morbidly obese. Image acquisition challenging due to patient body habitus. IMPRESSIONS  1. Left ventricular ejection fraction, by estimation, is 65 to 70%. The left ventricle has normal function. The left ventricle has no regional wall motion abnormalities. Left ventricular diastolic parameters were normal.  2. Right ventricular systolic function is normal. The right ventricular size is normal.  3. The mitral valve is grossly normal. No evidence of mitral valve regurgitation.  4. The aortic valve is grossly normal. Aortic valve regurgitation is not visualized. No aortic stenosis is present.  5. Aortic dilatation noted. There is mild dilatation of the ascending aorta, measuring 41 mm. FINDINGS  Left Ventricle: Left ventricular ejection fraction, by estimation, is 65 to 70%. The left ventricle has normal function. The left ventricle has no regional wall motion abnormalities. The left ventricular internal cavity size was normal in size. There is  no left ventricular hypertrophy. Left ventricular diastolic parameters were normal. Right Ventricle: The right ventricular size is normal. Right vetricular wall thickness was not well visualized. Right ventricular systolic function is normal. Left Atrium: Left atrial size was normal in size. Right Atrium: Right atrial size was normal in size. Pericardium: There is no evidence of  pericardial effusion. Mitral Valve: The mitral valve is grossly normal. No evidence of mitral valve regurgitation. Tricuspid Valve: The tricuspid valve is grossly normal. Tricuspid valve regurgitation is trivial. Aortic Valve: The aortic valve is grossly normal. Aortic valve regurgitation is not visualized. No aortic stenosis is present. Aortic valve peak gradient measures 14.9 mmHg. Pulmonic Valve: The pulmonic valve was not well visualized. Pulmonic valve regurgitation is not visualized. Aorta: Aortic dilatation noted. There is mild dilatation of the ascending aorta, measuring 41 mm. IAS/Shunts: The atrial septum is grossly normal.  LEFT VENTRICLE PLAX 2D LVIDd:         3.80 cm      Diastology LVIDs:         2.60 cm      LV e'  medial:    14.00 cm/s LV PW:         1.00 cm      LV E/e' medial:  5.1 LV IVS:        1.00 cm      LV e' lateral:   17.20 cm/s LVOT diam:     2.10 cm      LV E/e' lateral: 4.2 LV SV:         87 LV SV Index:   35 LVOT Area:     3.46 cm  LV Volumes (MOD) LV vol d, MOD A2C: 97.6 ml LV vol d, MOD A4C: 115.0 ml LV vol s, MOD A2C: 39.0 ml LV vol s, MOD A4C: 45.1 ml LV SV MOD A2C:     58.6 ml LV SV MOD A4C:     115.0 ml LV SV MOD BP:      65.5 ml RIGHT VENTRICLE             IVC RV S prime:     25.40 cm/s  IVC diam: 1.70 cm TAPSE (M-mode): 3.2 cm LEFT ATRIUM             Index        RIGHT ATRIUM           Index LA diam:        3.80 cm 1.52 cm/m   RA Area:     18.80 cm LA Vol (A2C):   55.7 ml 22.27 ml/m  RA Volume:   53.70 ml  21.47 ml/m LA Vol (A4C):   70.8 ml 28.30 ml/m LA Biplane Vol: 63.7 ml 25.46 ml/m  AORTIC VALVE AV Area (Vmax): 2.94 cm AV Vmax:        193.00 cm/s AV Peak Grad:   14.9 mmHg LVOT Vmax:      164.00 cm/s LVOT Vmean:     111.000 cm/s LVOT VTI:       0.252 m  AORTA Ao Root diam: 3.50 cm Ao Asc diam:  4.10 cm MITRAL VALVE               TRICUSPID VALVE MV Area (PHT): 6.02 cm    TR Peak grad:   7.8 mmHg MV Decel Time: 126 msec    TR Vmax:        140.00 cm/s MV E velocity:  71.40 cm/s MV A velocity: 98.30 cm/s  SHUNTS MV E/A ratio:  0.73        Systemic VTI:  0.25 m                            Systemic Diam: 2.10 cm Mertie Moores MD Electronically signed by Mertie Moores MD Signature Date/Time: 03/18/2021/4:26:03 PM    Final     Patient benefited maximally from their hospital stay and there were no complications.     Disposition: Discharge disposition: 03-Skilled Nursing Facility      Discharge Instructions     Call MD / Call 911   Complete by: As directed    If you experience chest pain or shortness of breath, CALL 911 and be transported to the hospital emergency room.  If you develope a fever above 101 F, pus (white drainage) or increased drainage or redness at the wound, or calf pain, call your surgeon's office.   Call MD / Call 911   Complete by: As directed    If you experience chest pain or shortness of breath,  CALL 911 and be transported to the hospital emergency room.  If you develope a fever above 101 F, pus (white drainage) or increased drainage or redness at the wound, or calf pain, call your surgeon's office.   Constipation Prevention   Complete by: As directed    Drink plenty of fluids.  Prune juice may be helpful.  You may use a stool softener, such as Colace (over the counter) 100 mg twice a day.  Use MiraLax (over the counter) for constipation as needed.   Constipation Prevention   Complete by: As directed    Drink plenty of fluids.  Prune juice may be helpful.  You may use a stool softener, such as Colace (over the counter) 100 mg twice a day.  Use MiraLax (over the counter) for constipation as needed.   Diet - low sodium heart healthy   Complete by: As directed    Diet - low sodium heart healthy   Complete by: As directed    Discharge instructions   Complete by: As directed    Okay to weight-bear through left arm to use walker.  Okay for active and passive range of motion as tolerated left arm.  May use sling for comfort for first 2  weeks.   Increase activity slowly as tolerated   Complete by: As directed    Increase activity slowly as tolerated   Complete by: As directed    Post-operative opioid taper instructions:   Complete by: As directed    POST-OPERATIVE OPIOID TAPER INSTRUCTIONS: It is important to wean off of your opioid medication as soon as possible. If you do not need pain medication after your surgery it is ok to stop day one. Opioids include: Codeine, Hydrocodone(Norco, Vicodin), Oxycodone(Percocet, oxycontin) and hydromorphone amongst others.  Long term and even short term use of opiods can cause: Increased pain response Dependence Constipation Depression Respiratory depression And more.  Withdrawal symptoms can include Flu like symptoms Nausea, vomiting And more Techniques to manage these symptoms Hydrate well Eat regular healthy meals Stay active Use relaxation techniques(deep breathing, meditating, yoga) Do Not substitute Alcohol to help with tapering If you have been on opioids for less than two weeks and do not have pain than it is ok to stop all together.  Plan to wean off of opioids This plan should start within one week post op of your joint replacement. Maintain the same interval or time between taking each dose and first decrease the dose.  Cut the total daily intake of opioids by one tablet each day Next start to increase the time between doses. The last dose that should be eliminated is the evening dose.      Post-operative opioid taper instructions:   Complete by: As directed    POST-OPERATIVE OPIOID TAPER INSTRUCTIONS: It is important to wean off of your opioid medication as soon as possible. If you do not need pain medication after your surgery it is ok to stop day one. Opioids include: Codeine, Hydrocodone(Norco, Vicodin), Oxycodone(Percocet, oxycontin) and hydromorphone amongst others.  Long term and even short term use of opiods can cause: Increased pain  response Dependence Constipation Depression Respiratory depression And more.  Withdrawal symptoms can include Flu like symptoms Nausea, vomiting And more Techniques to manage these symptoms Hydrate well Eat regular healthy meals Stay active Use relaxation techniques(deep breathing, meditating, yoga) Do Not substitute Alcohol to help with tapering If you have been on opioids for less than two weeks and do not have pain than it  is ok to stop all together.  Plan to wean off of opioids This plan should start within one week post op of your joint replacement. Maintain the same interval or time between taking each dose and first decrease the dose.  Cut the total daily intake of opioids by one tablet each day Next start to increase the time between doses. The last dose that should be eliminated is the evening dose.          Follow-up Information     Burnell Blanks, MD Follow up.   Specialty: Cardiology Why: office will call with date and time of appintment Contact information: [place]. [address] [place] [address] [phone]         Meredith Pel, MD Follow up in 1 week(s).   Specialty: Orthopedic Surgery Contact information: [address] [place] [address] [phone]                  Signed: Newt Minion 03/20/2021, 8:31 AM

## 2021-03-20 NOTE — Progress Notes (Signed)
Pt for dc to snf today Dc summary done

## 2021-03-20 NOTE — TOC Transition Note (Signed)
Transition of Care [facility]) - CM/SW Discharge Note   Patient Details  Name: Travis SLINKER Sr. MRN: [id number] Date of Birth: Jun 14, 1950  Transition of Care [place]) CM/SW Contact:  Joanne Chars, LCSW Phone Number: 03/20/2021, 10:34 AM   Clinical Narrative:   Pt discharging to [facility].  RN call [phone] for report.      Final next level of care: Skilled Nursing Facility Barriers to Discharge: Barriers Resolved   Patient Goals and CMS Choice        Discharge Placement              Patient chooses bed at:  [facility]) Patient to be transferred to facility by: [place] Name of family member notified: brother Travis [name] Patient and family notified of of transfer: 03/20/21  Discharge Plan and Services                                     Social Determinants of Health ([place]) Interventions     Readmission Risk Interventions No flowsheet data found.

## 2021-03-20 NOTE — Progress Notes (Signed)
Per CCMD, pt had 3 complexes of secondary heart block type 2 with HR down to 39. Pt returned to NSR with HR in 80's. Sharol Given, MD at bedside notified. Will continue to monitor.

## 2021-03-20 NOTE — Progress Notes (Signed)
Physician Discharge Summary      Patient ID: Travis SOMERA Sr. MRN: [id number] DOB/AGE: [birth year] 70 y.o.  Admit date: 03/17/2021 Discharge date: 03/20/2021  Admission Diagnoses:  Active Problems:   Hypotension during surgery   S/P reverse total shoulder arthroplasty, left   AV block, Mobitz 2   Arthritis of left shoulder region   Discharge Diagnoses:  Same  Surgeries: Procedure(s): LEFT REVERSE SHOULDER ARTHROPLASTY on 03/17/2021   Consultants:   Discharged Condition: Stable  Hospital Course: Travis Lada. is an 70 y.o. male who was admitted 03/17/2021 with a chief complaint of left shoulder pain, and found to have a diagnosis of left shoulder arthritis.  They were brought to the operating room on 03/17/2021 and underwent the above named procedures.  Patient did well with rehabilitation.  He was seen by Occupational Therapy and physical therapy and started on shoulder range of motion on postop day #1.  He was able to mobilize well in the hospital with ambulation.  Cardiac status remained stable during his hospitalization.  Patient is discharged to skilled nursing as he lives alone.  He will rehabilitate there for 1 to 2 weeks until he is stable for discharge.  He is discharged home on Norco for pain as well as all of his preop medications including aspirin and Effient for his cardiac issues.  He will follow-up with [place] in 10 days.  Okay to weight-bear through the left shoulder for balance and use of walker.  Patient may shower because dressing is waterproof.  Antibiotics given:  Anti-infectives (From admission, onward)    Start     Dose/Rate Route Frequency Ordered Stop   03/17/21 2100  ceFAZolin (ANCEF) IVPB 2g/100 mL premix        2 g 200 mL/hr over 30 Minutes Intravenous Every 8 hours 03/17/21 1839 03/18/21 1246   03/17/21 1342  vancomycin (VANCOCIN) powder  Status:  Discontinued          As needed 03/17/21 1342 03/17/21 1642   03/17/21 0900  ceFAZolin  (ANCEF) IVPB 3g/100 mL premix        3 g 200 mL/hr over 30 Minutes Intravenous On call to O.R. 03/17/21 8413 03/17/21 1317     .  Recent vital signs:  Vitals:   03/19/21 2042 03/20/21 0743  BP:  (!) 151/87  Pulse: 87 84  Resp: 18 17  Temp:  98.3 F (36.8 C)  SpO2: 97% 92%    Recent laboratory studies:  Results for orders placed or performed during the hospital encounter of 03/17/21  SARS Coronavirus 2 by RT PCR (hospital order, performed in [place] hospital lab) Nasopharyngeal Nasopharyngeal Swab   Specimen: Nasopharyngeal Swab  Result Value Ref Range   SARS Coronavirus 2 NEGATIVE NEGATIVE  SARS CORONAVIRUS 2 (TAT 6-24 HRS) Nasopharyngeal Nasopharyngeal Swab   Specimen: Nasopharyngeal Swab  Result Value Ref Range   SARS Coronavirus 2 NEGATIVE NEGATIVE  Glucose, capillary  Result Value Ref Range   Glucose-Capillary 86 70 - 99 mg/dL  Glucose, capillary  Result Value Ref Range   Glucose-Capillary 100 (H) 70 - 99 mg/dL  Glucose, capillary  Result Value Ref Range   Glucose-Capillary 168 (H) 70 - 99 mg/dL  CBC  Result Value Ref Range   WBC 8.9 4.0 - 10.5 K/uL   RBC 3.63 (L) 4.22 - 5.81 MIL/uL   Hemoglobin 11.1 (L) 13.0 - 17.0 g/dL   HCT 32.4 (L) 39.0 - 52.0 %   MCV 89.3 80.0 - 100.0 fL  MCH 30.6 26.0 - 34.0 pg   MCHC 34.3 30.0 - 36.0 g/dL   RDW 14.5 11.5 - 15.5 %   Platelets 234 150 - 400 K/uL   nRBC 0.0 0.0 - 0.2 %  Basic metabolic panel  Result Value Ref Range   Sodium 136 135 - 145 mmol/L   Potassium 3.9 3.5 - 5.1 mmol/L   Chloride 103 98 - 111 mmol/L   CO2 27 22 - 32 mmol/L   Glucose, Bld 217 (H) 70 - 99 mg/dL   BUN 17 8 - 23 mg/dL   Creatinine, Ser 1.14 0.61 - 1.24 mg/dL   Calcium 8.8 (L) 8.9 - 10.3 mg/dL   GFR, Estimated >60 >60 mL/min   Anion gap 6 5 - 15  CBC  Result Value Ref Range   WBC 6.6 4.0 - 10.5 K/uL   RBC 3.49 (L) 4.22 - 5.81 MIL/uL   Hemoglobin 10.6 (L) 13.0 - 17.0 g/dL   HCT 31.6 (L) 39.0 - 52.0 %   MCV 90.5 80.0 - 100.0 fL   MCH  30.4 26.0 - 34.0 pg   MCHC 33.5 30.0 - 36.0 g/dL   RDW 14.9 11.5 - 15.5 %   Platelets 218 150 - 400 K/uL   nRBC 0.0 0.0 - 0.2 %  Glucose, capillary  Result Value Ref Range   Glucose-Capillary 123 (H) 70 - 99 mg/dL  I-STAT 7, (LYTES, BLD GAS, ICA, H+H)  Result Value Ref Range   pH, Arterial 7.360 7.350 - 7.450   pCO2 arterial 48.1 (H) 32.0 - 48.0 mmHg   pO2, Arterial 218 (H) 83.0 - 108.0 mmHg   Bicarbonate 27.2 20.0 - 28.0 mmol/L   TCO2 29 22 - 32 mmol/L   O2 Saturation 100.0 %   Acid-Base Excess 1.0 0.0 - 2.0 mmol/L   Sodium 138 135 - 145 mmol/L   Potassium 3.5 3.5 - 5.1 mmol/L   Calcium, Ion 1.41 (H) 1.15 - 1.40 mmol/L   HCT 36.0 (L) 39.0 - 52.0 %   Hemoglobin 12.2 (L) 13.0 - 17.0 g/dL   Sample type ARTERIAL   ECHOCARDIOGRAM COMPLETE  Result Value Ref Range   Weight 4,784 oz   Height 71 in   BP 136/74 mmHg   Single Plane A2C EF 60.0 %   Single Plane A4C EF 60.8 %   Calc EF 60.3 %   S' Lateral 2.60 cm   AR max vel 2.94 cm2   AV Peak grad 14.9 mmHg   Ao pk vel 1.93 m/s   Area-P 1/2 6.02 cm2   *Note: Due to a large number of results and/or encounters for the requested time period, some results have not been displayed. A complete set of results can be found in Results Review.    Discharge Medications:   Allergies as of 03/20/2021   No Known Allergies      Medication List     STOP taking these medications    buprenorphine 20 MCG/HR Ptwk Commonly known as: BUTRANS   celecoxib 200 MG capsule Commonly known as: CELEBREX   Fish Oil 1000 MG Caps   predniSONE 10 MG tablet Commonly known as: DELTASONE       TAKE these medications    albuterol 108 (90 Base) MCG/ACT inhaler Commonly known as: VENTOLIN HFA Inhale 2 puffs into the lungs every 6 (six) hours as needed for wheezing or shortness of breath.   ARIPiprazole 10 MG tablet Commonly known as: ABILIFY Take 1 tablet (10 mg total) by mouth  daily.   aspirin EC 81 MG tablet Take 81 mg by mouth at bedtime.  Swallow whole.   atorvastatin 20 MG tablet Commonly known as: LIPITOR Take 1 tablet (20 mg total) by mouth daily.   Azelastine-Fluticasone 137-50 MCG/ACT Susp Use both nostrils as directed twice daily   benztropine 0.5 MG tablet Commonly known as: COGENTIN Take 1 tablet (0.5 mg total) by mouth at bedtime.   budesonide-formoterol 160-4.5 MCG/ACT inhaler Commonly known as: SYMBICORT Inhale 2 puffs into the lungs 2 (two) times daily.   calcium carbonate 500 MG chewable tablet Commonly known as: TUMS - dosed in mg elemental calcium Chew 1 tablet by mouth 3 (three) times daily with meals.   cetirizine 10 MG tablet Commonly known as: ZYRTEC Take 1 tablet (10 mg total) by mouth daily.   diclofenac Sodium 1 % Gel Commonly known as: VOLTAREN Apply 4 g topically 3 (three) times daily as needed (pain).   docusate sodium 100 MG capsule Commonly known as: COLACE Take 1 capsule (100 mg total) by mouth 2 (two) times daily.   donepezil 5 MG tablet Commonly known as: ARICEPT Take 1 tablet (5 mg total) by mouth at bedtime.   DULoxetine 20 MG capsule Commonly known as: CYMBALTA Take 40 mg by mouth in the morning and at bedtime.   DULoxetine HCl 40 MG Cpep Take 1 capsule (13m total) by mouth daily.   fluticasone 50 MCG/ACT nasal spray Commonly known as: FLONASE Place 2 sprays into both nostrils in the morning and at bedtime.   gabapentin 800 MG tablet Commonly known as: NEURONTIN Take 800 mg by mouth 3 (three) times daily.   hydrochlorothiazide 25 MG tablet Commonly known as: HYDRODIURIL Take 1 tablet (25 mg total) by mouth daily.   HYDROcodone-acetaminophen 10-325 MG tablet Commonly known as: NORCO Take 1 tablet by mouth every 6 (six) hours as needed. What changed:  when to take this reasons to take this   IRON PO Take by mouth.   losartan 50 MG tablet Commonly known as: COZAAR Take 1 tablet (50 mg total) by mouth daily.   melatonin 5 MG Tabs Take 15 mg by mouth  at bedtime.   methocarbamol 500 MG tablet Commonly known as: ROBAXIN Take 1 tablet (500 mg total) by mouth every 8 (eight) hours as needed for muscle spasms.   Myrbetriq 50 MG Tb24 tablet Generic drug: mirabegron ER Take 50 mg by mouth at bedtime.   phentermine 37.5 MG capsule Take 1 capsule (37.5 mg total) by mouth every morning.   prasugrel 5 MG Tabs tablet Commonly known as: EFFIENT Take 1 tablet (5 mg total) by mouth daily.   tamsulosin 0.4 MG Caps capsule Commonly known as: FLOMAX Take 0.4 mg by mouth 2 (two) times daily.   Testosterone 1.62 % Gel Place 1 Pump onto the skin daily. What changed: Another medication with the same name was removed. Continue taking this medication, and follow the directions you see here.   traZODone 150 MG tablet Commonly known as: DESYREL Take 1 tablet (150 mg total) by mouth at bedtime.   VITAMIN D3 PO Take 2 tablets by mouth at bedtime.   Zenpep 40000-126000 units Cpep Generic drug: Pancrelipase (Lip-Prot-Amyl) Take 2 capsules (80,000 units) with meals and 1 capsule (40,000 units) with each snack. PLEASE SCHEDULE A YEARLY FOLLOW UP FOR FURTHER REFILLS. Thank you   zolpidem 5 MG tablet Commonly known as: AMBIEN Take 1 tablet (5 mg total) by mouth at bedtime as needed for sleep. What  changed:  medication strength how much to take               Durable Medical Equipment  (From admission, onward)           Start     Ordered   03/17/21 1840  For home use only DME Walker platform  Once       Question:  Patient needs a walker to treat with the following condition  Answer:  S/P reverse total shoulder arthroplasty, left   03/17/21 1839            Diagnostic Studies: DG Shoulder Left Port  Result Date: 03/17/2021 CLINICAL DATA:  Left shoulder arthroplasty. EXAM: LEFT SHOULDER COMPARISON:  Left shoulder radiograph dated 12/27/2020. FINDINGS: There is a total left shoulder arthroplasty. The arthroplasty components appear  intact and aligned. There is no acute fracture or dislocation. There is postsurgical changes with soft tissue air over the left shoulder. IMPRESSION: Total left shoulder arthroplasty. Electronically Signed   By: Anner Crete M.D.   On: 03/17/2021 19:10   ECHOCARDIOGRAM COMPLETE  Result Date: 03/18/2021    ECHOCARDIOGRAM REPORT   Patient Name:   Travis SHAVERS Sr. Date of Exam: 03/18/2021 Medical Rec #:  [id number]               Height:       71.0 in Accession #:    [id number]              Weight:       299.0 lb Date of Birth:  10-11-1950                BSA:          2.502 m Patient Age:    25 years                BP:           118/57 mmHg Patient Gender: M                       HR:           99 bpm. Exam Location:  Inpatient Procedure: 2D Echo, Cardiac Doppler and Color Doppler Indications:    Abnl EKG  History:        Patient has prior history of Echocardiogram examinations, most                 recent 01/09/2018. Risk Factors:Hypertension and Diabetes.  Sonographer:    Helmut Muster Referring Phys: [id number] Nadean Corwin HILTY  Sonographer Comments: Patient is morbidly obese. Image acquisition challenging due to patient body habitus. IMPRESSIONS  1. Left ventricular ejection fraction, by estimation, is 65 to 70%. The left ventricle has normal function. The left ventricle has no regional wall motion abnormalities. Left ventricular diastolic parameters were normal.  2. Right ventricular systolic function is normal. The right ventricular size is normal.  3. The mitral valve is grossly normal. No evidence of mitral valve regurgitation.  4. The aortic valve is grossly normal. Aortic valve regurgitation is not visualized. No aortic stenosis is present.  5. Aortic dilatation noted. There is mild dilatation of the ascending aorta, measuring 41 mm. FINDINGS  Left Ventricle: Left ventricular ejection fraction, by estimation, is 65 to 70%. The left ventricle has normal function. The left ventricle has no regional wall  motion abnormalities. The left ventricular internal cavity size was normal in size. There is  no left ventricular hypertrophy. Left ventricular  diastolic parameters were normal. Right Ventricle: The right ventricular size is normal. Right vetricular wall thickness was not well visualized. Right ventricular systolic function is normal. Left Atrium: Left atrial size was normal in size. Right Atrium: Right atrial size was normal in size. Pericardium: There is no evidence of pericardial effusion. Mitral Valve: The mitral valve is grossly normal. No evidence of mitral valve regurgitation. Tricuspid Valve: The tricuspid valve is grossly normal. Tricuspid valve regurgitation is trivial. Aortic Valve: The aortic valve is grossly normal. Aortic valve regurgitation is not visualized. No aortic stenosis is present. Aortic valve peak gradient measures 14.9 mmHg. Pulmonic Valve: The pulmonic valve was not well visualized. Pulmonic valve regurgitation is not visualized. Aorta: Aortic dilatation noted. There is mild dilatation of the ascending aorta, measuring 41 mm. IAS/Shunts: The atrial septum is grossly normal.  LEFT VENTRICLE PLAX 2D LVIDd:         3.80 cm      Diastology LVIDs:         2.60 cm      LV e' medial:    14.00 cm/s LV PW:         1.00 cm      LV E/e' medial:  5.1 LV IVS:        1.00 cm      LV e' lateral:   17.20 cm/s LVOT diam:     2.10 cm      LV E/e' lateral: 4.2 LV SV:         87 LV SV Index:   35 LVOT Area:     3.46 cm  LV Volumes (MOD) LV vol d, MOD A2C: 97.6 ml LV vol d, MOD A4C: 115.0 ml LV vol s, MOD A2C: 39.0 ml LV vol s, MOD A4C: 45.1 ml LV SV MOD A2C:     58.6 ml LV SV MOD A4C:     115.0 ml LV SV MOD BP:      65.5 ml RIGHT VENTRICLE             IVC RV S prime:     25.40 cm/s  IVC diam: 1.70 cm TAPSE (M-mode): 3.2 cm LEFT ATRIUM             Index        RIGHT ATRIUM           Index LA diam:        3.80 cm 1.52 cm/m   RA Area:     18.80 cm LA Vol (A2C):   55.7 ml 22.27 ml/m  RA Volume:   53.70 ml   21.47 ml/m LA Vol (A4C):   70.8 ml 28.30 ml/m LA Biplane Vol: 63.7 ml 25.46 ml/m  AORTIC VALVE AV Area (Vmax): 2.94 cm AV Vmax:        193.00 cm/s AV Peak Grad:   14.9 mmHg LVOT Vmax:      164.00 cm/s LVOT Vmean:     111.000 cm/s LVOT VTI:       0.252 m  AORTA Ao Root diam: 3.50 cm Ao Asc diam:  4.10 cm MITRAL VALVE               TRICUSPID VALVE MV Area (PHT): 6.02 cm    TR Peak grad:   7.8 mmHg MV Decel Time: 126 msec    TR Vmax:        140.00 cm/s MV E velocity: 71.40 cm/s MV A velocity: 98.30 cm/s  SHUNTS MV E/A ratio:  0.73  Systemic VTI:  0.25 m                            Systemic Diam: 2.10 cm Mertie Moores MD Electronically signed by Mertie Moores MD Signature Date/Time: 03/18/2021/4:26:03 PM    Final     Disposition: Discharge disposition: 03-Skilled [facility]       Discharge Instructions     Call MD / Call 911   Complete by: As directed    If you experience chest pain or shortness of breath, CALL 911 and be transported to the hospital emergency room.  If you develope a fever above 101 F, pus (white drainage) or increased drainage or redness at the wound, or calf pain, call your surgeon's office.   Constipation Prevention   Complete by: As directed    Drink plenty of fluids.  Prune juice may be helpful.  You may use a stool softener, such as Colace (over the counter) 100 mg twice a day.  Use MiraLax (over the counter) for constipation as needed.   Diet - low sodium heart healthy   Complete by: As directed    Discharge instructions   Complete by: As directed    Okay to weight-bear through left arm to use walker.  Okay for active and passive range of motion as tolerated left arm.  May use sling for comfort for first 2 weeks.   Increase activity slowly as tolerated   Complete by: As directed    Post-operative opioid taper instructions:   Complete by: As directed    POST-OPERATIVE OPIOID TAPER INSTRUCTIONS: It is important to wean off of your opioid medication as soon  as possible. If you do not need pain medication after your surgery it is ok to stop day one. Opioids include: Codeine, Hydrocodone(Norco, Vicodin), Oxycodone(Percocet, oxycontin) and hydromorphone amongst others.  Long term and even short term use of opiods can cause: Increased pain response Dependence Constipation Depression Respiratory depression And more.  Withdrawal symptoms can include Flu like symptoms Nausea, vomiting And more Techniques to manage these symptoms Hydrate well Eat regular healthy meals Stay active Use relaxation techniques(deep breathing, meditating, yoga) Do Not substitute Alcohol to help with tapering If you have been on opioids for less than two weeks and do not have pain than it is ok to stop all together.  Plan to wean off of opioids This plan should start within one week post op of your joint replacement. Maintain the same interval or time between taking each dose and first decrease the dose.  Cut the total daily intake of opioids by one tablet each day Next start to increase the time between doses. The last dose that should be eliminated is the evening dose.           Follow-up Information     Burnell Blanks, MD Follow up.   Specialty: Cardiology Why: office will call with date and time of appintment Contact information: [place]. [address] [place] [address] [phone]                  Signed: Anderson Malta 03/20/2021, 7:46 AM

## 2021-03-20 NOTE — Progress Notes (Signed)
Patient ID: Travis [name]., male   DOB: 08-14-1950, 70 y.o.   MRN: [id number] Patient without complaints this morning.  Plan for discharge to skilled nursing.

## 2021-03-20 NOTE — Plan of Care (Signed)
  Problem: Education: Goal: Knowledge of General Education information will improve Description: Including pain rating scale, medication(s)/side effects and non-pharmacologic comfort measures Outcome: Progressing   Problem: Health Behavior/Discharge Planning: Goal: Ability to manage health-related needs will improve Outcome: Progressing   Problem: Elimination: Goal: Will not experience complications related to bowel motility Outcome: Progressing   Problem: Pain Managment: Goal: General experience of comfort will improve Outcome: Progressing   Problem: Safety: Goal: Ability to remain free from injury will improve Outcome: Progressing   Problem: Skin Integrity: Goal: Risk for impaired skin integrity will decrease Outcome: Progressing

## 2021-03-21 DIAGNOSIS — Z4889 Encounter for other specified surgical aftercare: Secondary | ICD-10-CM | POA: Diagnosis not present

## 2021-03-22 DIAGNOSIS — R5381 Other malaise: Secondary | ICD-10-CM | POA: Diagnosis not present

## 2021-03-22 DIAGNOSIS — R2689 Other abnormalities of gait and mobility: Secondary | ICD-10-CM | POA: Diagnosis not present

## 2021-03-22 DIAGNOSIS — M25512 Pain in left shoulder: Secondary | ICD-10-CM | POA: Diagnosis not present

## 2021-03-22 DIAGNOSIS — M6281 Muscle weakness (generalized): Secondary | ICD-10-CM | POA: Diagnosis not present

## 2021-03-23 NOTE — Telephone Encounter (Signed)
Not trying to get out of this, but he just had surgury - if at all possible could he contact his surgeon regarding need for home health with PT and OT and even an aideif possible, not sure what else to say

## 2021-03-23 NOTE — Telephone Encounter (Signed)
Patient states he is unable to use his left arm  Patient states he needs help in the evening with household chores (cooking, cleaning)  Please advise

## 2021-03-23 NOTE — Telephone Encounter (Signed)
Patient notified

## 2021-03-24 ENCOUNTER — Telehealth: Payer: Self-pay | Admitting: Orthopedic Surgery

## 2021-03-24 NOTE — Telephone Encounter (Signed)
Pt called and CPM machine belt is not long enough to go down around waist. The therapist told him to call and ask for a new belt that's is longer.   CB [phone]

## 2021-03-25 DIAGNOSIS — G894 Chronic pain syndrome: Secondary | ICD-10-CM | POA: Diagnosis not present

## 2021-03-25 DIAGNOSIS — I110 Hypertensive heart disease with heart failure: Secondary | ICD-10-CM | POA: Diagnosis not present

## 2021-03-25 DIAGNOSIS — M48061 Spinal stenosis, lumbar region without neurogenic claudication: Secondary | ICD-10-CM | POA: Diagnosis not present

## 2021-03-25 DIAGNOSIS — M6281 Muscle weakness (generalized): Secondary | ICD-10-CM | POA: Diagnosis not present

## 2021-03-25 DIAGNOSIS — Z96612 Presence of left artificial shoulder joint: Secondary | ICD-10-CM | POA: Diagnosis not present

## 2021-03-25 DIAGNOSIS — M797 Fibromyalgia: Secondary | ICD-10-CM | POA: Diagnosis not present

## 2021-03-25 DIAGNOSIS — M4802 Spinal stenosis, cervical region: Secondary | ICD-10-CM | POA: Diagnosis not present

## 2021-03-25 DIAGNOSIS — Z89511 Acquired absence of right leg below knee: Secondary | ICD-10-CM | POA: Diagnosis not present

## 2021-03-25 NOTE — Telephone Encounter (Signed)
I have messaged [employer] and Ovid Curd with [employer] asking them to help with this

## 2021-03-28 ENCOUNTER — Other Ambulatory Visit: Payer: Self-pay | Admitting: Internal Medicine

## 2021-03-28 DIAGNOSIS — M4326 Fusion of spine, lumbar region: Secondary | ICD-10-CM | POA: Diagnosis not present

## 2021-03-28 DIAGNOSIS — M4726 Other spondylosis with radiculopathy, lumbar region: Secondary | ICD-10-CM | POA: Diagnosis not present

## 2021-03-28 DIAGNOSIS — M48061 Spinal stenosis, lumbar region without neurogenic claudication: Secondary | ICD-10-CM | POA: Diagnosis not present

## 2021-03-29 ENCOUNTER — Ambulatory Visit: Payer: Medicare Other | Admitting: Family Medicine

## 2021-03-30 ENCOUNTER — Other Ambulatory Visit: Payer: Self-pay

## 2021-03-30 ENCOUNTER — Ambulatory Visit (INDEPENDENT_AMBULATORY_CARE_PROVIDER_SITE_OTHER): Payer: Medicare Other | Admitting: Orthopedic Surgery

## 2021-03-30 ENCOUNTER — Telehealth (HOSPITAL_BASED_OUTPATIENT_CLINIC_OR_DEPARTMENT_OTHER): Payer: Medicare Other | Admitting: Psychiatry

## 2021-03-30 ENCOUNTER — Encounter (HOSPITAL_COMMUNITY): Payer: Self-pay | Admitting: Psychiatry

## 2021-03-30 ENCOUNTER — Ambulatory Visit: Payer: Self-pay

## 2021-03-30 VITALS — Wt 299.0 lb

## 2021-03-30 DIAGNOSIS — F419 Anxiety disorder, unspecified: Secondary | ICD-10-CM

## 2021-03-30 DIAGNOSIS — Z96612 Presence of left artificial shoulder joint: Secondary | ICD-10-CM

## 2021-03-30 DIAGNOSIS — F319 Bipolar disorder, unspecified: Secondary | ICD-10-CM

## 2021-03-30 MED ORDER — TRAZODONE HCL 150 MG PO TABS: 150.0000 mg | ORAL_TABLET | Freq: Every day | ORAL | 0 refills | Status: DC

## 2021-03-30 MED ORDER — DULOXETINE HCL 20 MG PO CPEP: 20.0000 mg | ORAL_CAPSULE | Freq: Two times a day (BID) | ORAL | 0 refills | Status: DC

## 2021-03-30 MED ORDER — ARIPIPRAZOLE 10 MG PO TABS: 10.0000 mg | ORAL_TABLET | Freq: Every day | ORAL | 0 refills | Status: DC

## 2021-03-30 NOTE — Progress Notes (Addendum)
Virtual Visit via Telephone Note  I connected with Travis Novak Sr. on 03/30/21 at  9:00 AM EST by telephone and verified that I am speaking with the correct person using two identifiers.  Location: Patient: Home Provider: Home Office   I discussed the limitations, risks, security and privacy concerns of performing an evaluation and management service by telephone and the availability of in person appointments. I also discussed with the patient that there may be a patient responsible charge related to this service. The patient expressed understanding and agreed to proceed.   History of Present Illness: Patient is evaluated by phone session.  He is doing much better on his medication.  Recently he had a left shoulder arthroplasty and he is feeling better.  He is still on pain medication but overall he feels better.  He denies any highs or lows, mania, anger, panic attack.  Excited because he is going to [place] in December to see his first great-grandson.  Patient also happy because recently his new place which he applied is approved. Patient told he can move to his own place and he did.  He is sleeping good.  Denies any crying spells or any hallucination.  He is watching his calorie intake and he had lost another 10 pounds since the last visit.  His last hemoglobin A1c is 5.7 which was done on October 19.  He is taking trazodone, Ambien, gabapentin from other provider.  He also takes melatonin and he reported that his sleep is much better and he reluctant to cut down his medication.  Past Psychiatric History:  Saw psychiatrist in [place] and [place].  H/O mood swings, suicidal gesture, anger, hallucination, irritability and increase spending. Married 5 times. H/O inpatient in 2014 due to overdose on Xanax and alcohol.  Did IOP. Took Effexor, Xanax, Klonopin, Ambien, Lunesta, Wellbutrin, Ambien, Remeron and Prozac. Abilify started in our office.   Psychiatric Specialty Exam: Physical Exam   Review of Systems  Weight 299 lb (135.6 kg).There is no height or weight on file to calculate BMI.  General Appearance: NA  Eye Contact:  NA  Speech:  Clear and Coherent and Normal Rate  Volume:  Normal  Mood:  Euthymic  Affect:  NA  Thought Process:  Goal Directed  Orientation:  Full (Time, Place, and Person)  Thought Content:  WDL  Suicidal Thoughts:  No  Homicidal Thoughts:  No  Memory:  Immediate;   Good Recent;   Good Remote;   Fair  Judgement:  Intact  Insight:  Present  Psychomotor Activity:  NA  Concentration:  Concentration: Fair and Attention Span: Fair  Recall:  Good  Fund of Knowledge:  Good  Language:  Good  Akathisia:  No  Handed:  Right  AIMS (if indicated):     Assets:  Communication Skills Desire for Improvement Housing Resilience Social Support  ADL's:  Intact  Cognition:  WNL  Sleep:   Good      Assessment and Plan: Bipolar disorder type I.  Anxiety.  I reviewed his blood work results and current medication.  His last hemoglobin A1c is 5.8 which is improved from 6.1.  He also had lost weight.  We talked about polypharmacy but patient reluctant to cut down the medication since he feels that his mood is stable and afraid to have his symptoms come back.  We will continue Abilify 10 mg daily, trazodone 150 mg at bedtime and Cymbalta 20 mg twice a day.  Patient is getting Ambien from  another physician.  He is not taking Cogentin for a while because he does not report that his tremors are severe enough to take more medication.  I recommended to call us back if is any question or any concern.  Follow-up in 3 months.  Follow Up Instructions:    I discussed the assessment and treatment plan with the patient. The patient was provided an opportunity to ask questions and all were answered. The patient agreed with the plan and demonstrated an understanding of the instructions.   The patient was advised to call back or seek an in-person evaluation if the  symptoms worsen or if the condition fails to improve as anticipated.  I provided 21 minutes of non-face-to-face time during this encounter.   Kathlee Nations, MD

## 2021-03-31 ENCOUNTER — Encounter: Payer: Medicare Other | Admitting: Orthopedic Surgery

## 2021-03-31 DIAGNOSIS — J9620 Acute and chronic respiratory failure, unspecified whether with hypoxia or hypercapnia: Secondary | ICD-10-CM | POA: Diagnosis not present

## 2021-03-31 DIAGNOSIS — R0600 Dyspnea, unspecified: Secondary | ICD-10-CM | POA: Diagnosis not present

## 2021-04-02 ENCOUNTER — Encounter: Payer: Self-pay | Admitting: Orthopedic Surgery

## 2021-04-02 NOTE — Progress Notes (Signed)
Post-Op Visit Note   Patient: Travis HOLLINGS Sr.           Date of Birth: [birth year]           MRN: [id number] Visit Date: 03/30/2021 PCP: [name], MD   Assessment & Plan:  Chief Complaint:  Chief Complaint  Patient presents with   Left Shoulder - Routine Post Op    03/17/21 shoulder arthroplasty   Visit Diagnoses:  1. History of arthroplasty of left shoulder     Plan: Travis [name] is a 70 year old patient who underwent left reverse shoulder replacement 2 weeks ago.  He has been in a sling.  On exam he has range of motion passively of 20/60/80.  Plan is to discontinue sutures, discontinue sling.  Home health physical therapy 2 times a week for 6 weeks.  He is an amputee patient and is homebound.  4-week return for clinical recheck.  X-rays look reasonable today of the prosthesis.  Follow-Up Instructions: No follow-ups on file.   Orders:  Orders Placed This Encounter  Procedures   XR Shoulder Left   Ambulatory referral to Home Health   No orders of the defined types were placed in this encounter.   Imaging: No results found.  PMFS History: Patient Active Problem List   Diagnosis Date Noted   Arthritis of left shoulder region    AV block, [place] 2 03/18/2021   S/P reverse total shoulder arthroplasty, left 03/17/2021   Iliotibial band syndrome of both sides 03/16/2021   Chronic respiratory failure with hypoxia ([place]) 02/26/2021   Dyspnea 01/14/2021   Sinusitis 11/02/2020   Memory loss or impairment 10/11/2020   Right arm pain 09/18/2020   Aortic atherosclerosis ([place]) 09/16/2020   Spondylolisthesis, lumbar region    Status post lumbar spinal fusion 07/27/2020   Nasal sinus polyp 06/16/2020   Vitamin D deficiency 02/24/2020   Cough 07/29/2019   Wheezing 07/29/2019   Possible exposure to STD 07/24/2019   Unilateral primary osteoarthritis, left hip 06/06/2019   Status post total replacement of left hip 06/06/2019   S/P laparoscopic sleeve gastrectomy 03/03/2019    Rash 01/24/2019   Fever 08/15/2018   UTI (urinary tract infection) 08/15/2018   Fall at home, initial encounter 08/15/2018   Chronic anemia 08/15/2018   Hypokalemia 08/15/2018   Bowel incontinence 07/25/2018   Other spondylosis with radiculopathy, lumbar region 07/16/2018    Class: Chronic   Status post lumbar laminectomy 07/16/2018   Status post THR (total hip replacement) 04/17/2018   Unilateral primary osteoarthritis, right hip    Morbid obesity with BMI of 40.0-44.9, adult ([employer]) 02/28/2018   Myocardial infarction ([place]) 02/25/2018   Coronary artery disease involving native coronary artery of native heart 02/25/2018   PAD (peripheral artery disease) ([place]) 02/25/2018   S/P BKA (below knee amputation) unilateral, right ([place]) 02/25/2018   Sacroiliitis ([place]) 02/25/2018   SOB (shortness of breath) 01/03/2018   Acute on chronic heart failure ([place]) 01/03/2018   Severe right groin pain 12/20/2017   Morbid obesity ([place]) 10/09/2017   Low back pain 07/13/2017   Leukoplakia, tongue 01/26/2017   Skin lesion 10/20/2016   S/P unilateral BKA (below knee amputation), right ([place]) 06/14/2016   Charcot foot due to diabetes mellitus ([place])    Charcot's arthropathy associated with type 2 diabetes mellitus ([place]) 04/11/2016   Encounter for well adult exam with abnormal findings 11/05/2015   Major depression 09/13/2015   S/P TKR (total knee replacement) bilaterally 09/13/2015   GERD (gastroesophageal reflux disease)  09/08/2015   S/P laparoscopic hernia repair 05/11/2015   PPD positive 04/08/2015   Benign neoplasm of descending colon    Benign neoplasm of cecum    Acute blood loss as cause of postoperative anemia    Chronic anticoagulation    Occult blood positive stool 10/17/2014   General weakness 07/14/2014   Urinary incontinence 07/14/2014   Hypotension during surgery 01/30/2014    Class: Acute   Headache(784.0) 10/15/2013   Spinal stenosis in cervical region 09/26/2013    Class: Chronic    Congenital spinal stenosis of lumbar region 09/26/2013    Class: Chronic   Hand joint pain 06/10/2013   Rotator cuff tear arthropathy of both shoulders 06/10/2013   Skin lesion of cheek 05/01/2013   Pain of right thumb 04/03/2013   Balance disorder 03/12/2013   Gait disorder 03/12/2013   Tremor 03/12/2013   Left hip pain 03/12/2013   Pre-ulcerative corn or callous 02/06/2013   Anxiety 11/12/2011   OSA on CPAP 11/07/2011   Bradycardia 10/20/2011   Insomnia 10/04/2011   Obesity 01/12/2011   Diabetes mellitus type 2 in obese ([place]) 09/27/2010   ERECTILE DYSFUNCTION, ORGANIC 05/30/2010   Degenerative disc disease, lumbar 04/19/2010   SCIATICA, LEFT 04/19/2010   Chronic pain syndrome 10/27/2009   Hyperlipidemia 07/15/2009   Essential hypertension 06/24/2009   Coronary artery disease involving native coronary artery of native heart without angina pectoris 06/24/2009   Allergic rhinitis 06/24/2009   URETHRAL STRICTURE 06/24/2009   DEGENERATIVE JOINT DISEASE 06/24/2009   SHOULDER PAIN, BILATERAL 06/24/2009   FATIGUE 06/24/2009   NEPHROLITHIASIS, HX OF 06/24/2009   Past Medical History:  Diagnosis Date   ALLERGIC RHINITIS 06/24/2009   Allergy    Anemia    IDA   Anginal pain ([place])    Anxiety 11/12/2011   Adequate for discharge    Arthritis    "all my joints" (09/30/2013)   Arthritis of foot, right, degenerative 04/15/2014   Balance disorder 03/12/2013   Benign neoplasm of cecum    Benign neoplasm of descending colon    CAD (coronary artery disease) 06/24/2009   5 stents placed in 2007     Chronic anticoagulation    Chronic pain syndrome 10/27/2009   of ankle, shoulders, low back.  sciatica.    Closed fracture of right foot 10/17/2014   CORONARY ARTERY DISEASE 06/24/2009   a. s/p multiple PCIs - In 2008 he had a Taxus DES to the mild LAD, Endeavor DES to mid LCX and distal LCX. In January 2009 he had DES to distal LCX, mid LCX and proximal LCX. In November 2009 had BMS x 2  to the mid RCA. Cath 10/2011 with patent stents, noncardiac CP. LHC 01/2013: patent stents (noncardiac CP).   DEGENERATIVE JOINT DISEASE 06/24/2009   Qualifier: Diagnosis of  By: [name], [name]    Depression    Depression with anxiety    Prior suicide attempt(08/25/19-pt states not suicide attempt)   [place] DISEASE, LUMBAR 04/19/2010   Dyspnea    Dysrhythmia    ERECTILE DYSFUNCTION, ORGANIC 05/30/2010   Essential hypertension 06/24/2009   Qualifier: Diagnosis of  By: [name], [name]    Fibromyalgia    Fracture dislocation of ankle joint 09/02/2015   Gait disorder 03/12/2013   General weakness 07/14/2014   GERD (gastroesophageal reflux disease) 09/08/2015   08/25/15-pt states was cardiac origin, not GERD   Hand joint pain 06/10/2013   Heart murmur    Hepatitis C    treated pt.  unknown with what he was a teenager   History of kidney stones    Hyperlipidemia 07/15/2009   Qualifier: Diagnosis of  By: Aundra Dubin, MD, [place]     HYPERLIPIDEMIA-MIXED 07/15/2009   08/25/19-pt state cholesterol was normal range   HYPERTENSION 06/24/2009   Insomnia 10/04/2011   Irregular heart beat    Left hip pain 03/12/2013   Injected under ultrasound guidance on June 24, 2013    Major depression 09/13/2015   Myocardial infarction [facility]) 2008   Non-cardiac chest pain 10/2011, 01/2013   Obesity    Occult blood positive stool 10/17/2014   Open ankle fracture 09/02/2015   OSA (obstructive sleep apnea)    not using CPAP (09/30/2013)   Pain of right thumb 04/03/2013   Pneumonia    PPD positive 04/08/2015   Pre-ulcerative corn or callous 02/06/2013   Rotator cuff tear arthropathy of both shoulders 06/10/2013   History of bilateral shoulder cuff surgery for rotator cuff tears. Reports increase in pain 09/11/2015 during physical therapy of the left shoulder.    SCIATICA, LEFT 04/19/2010   Qualifier: Diagnosis of  By: [name], [name]    Sleep apnea    wears cpap 08/25/19-States not using due to nasal stuffiness.    Spinal stenosis in cervical region 09/26/2013   Spinal stenosis, lumbar region, with neurogenic claudication 09/26/2013   Type II diabetes mellitus ([place]) 2012   no meds in 09/2014.    Umbilical hernia    Uncontrolled type 2 DM with peripheral circulatory disorder 10/04/2013   08/25/19- pt states A1C normal, no diabetes   URETHRAL STRICTURE 06/24/2009   self catheterizes.     Family History  Problem Relation Age of Onset   Depression Mother    Heart disease Mother    Hypertension Mother    Breast cancer Mother        primary cancer   Stomach cancer Mother    Esophageal cancer Mother    Pancreatic cancer Mother    Diabetes Father    Heart disease Father        CABG   Hypertension Father    Hyperlipidemia Father    Prostate cancer Father    Skin cancer Father    Depression Brother        x 2   Hypertension Brother        x2   Colon polyps Brother    Heart attack Son 23   Early death Son    Heart disease Maternal Grandfather    Early death Maternal Grandfather    Heart attack Maternal Grandfather 12   Early death Paternal Grandfather    Coronary artery disease Other    Hypertension Other    Depression Other    Healthy Son    Colon cancer Neg Hx    Rectal cancer Neg Hx     Past Surgical History:  Procedure Laterality Date   AMPUTATION Right 06/14/2016   Procedure: AMPUTATION BELOW KNEE;  Surgeon: Newt Minion, MD;  Location: [place];  Service: Orthopedics;  Laterality: Right;   ANKLE FUSION Right 04/15/2014   Procedure: Right Subtalar, Talonavicular Fusion;  Surgeon: Newt Minion, MD;  Location: [place];  Service: Orthopedics;  Laterality: Right;   ANKLE FUSION Right 04/18/2016   Procedure: Right Ankle Tibiocalcaneal Fusion;  Surgeon: Newt Minion, MD;  Location: [place];  Service: Orthopedics;  Laterality: Right;   ANTERIOR CERVICAL DECOMP/DISCECTOMY FUSION N/A 09/26/2013   Procedure: ANTERIOR CERVICAL DISCECTOMY FUSION C3-4, plate and  screw fixation, allograft bone graft;   Surgeon: Jessy Oto, MD;  Location: [place];  Service: Orthopedics;  Laterality: N/A;   BACK SURGERY     3   BELOW KNEE LEG AMPUTATION Right 06/14/2016   right ankle and foot   CARDIAC CATHETERIZATION  X 1   CARPAL TUNNEL RELEASE Bilateral    COLONOSCOPY N/A 10/22/2014   Procedure: COLONOSCOPY;  Surgeon: Lafayette Dragon, MD;  Location: [facility] ENDOSCOPY;  Service: Endoscopy;  Laterality: N/A;   COLONOSCOPY  11/19/2018   CORONARY ANGIOPLASTY WITH STENT PLACEMENT     "I have 9 stents"   ESOPHAGOGASTRODUODENOSCOPY N/A 10/19/2014   Procedure: ESOPHAGOGASTRODUODENOSCOPY (EGD);  Surgeon: Jerene Bears, MD;  Location: [facility] ENDOSCOPY;  Service: Endoscopy;  Laterality: N/A;   FRACTURE SURGERY     FUSION OF TALONAVICULAR JOINT Right 04/15/2014   dr duda   GASTRIC BYPASS  02/20/2019   HERNIA REPAIR     umbilical   INGUINAL HERNIA REPAIR Right 05/11/2015   Procedure: LAPAROSCOPIC REPAIR RIGHT  INGUINAL HERNIA;  Surgeon: Greer Pickerel, MD;  Location: [place];  Service: General;  Laterality: Right;   INSERTION OF MESH Right 05/11/2015   Procedure: INSERTION OF MESH;  Surgeon: Greer Pickerel, MD;  Location: [place];  Service: General;  Laterality: Right;   JOINT REPLACEMENT     KNEE CARTILAGE SURGERY Right X 12   "~ 1/2 open; ~ 1/2 scopes"   KNEE CARTILAGE SURGERY Left X 3   "3 scopes"   LAPAROSCOPIC GASTRIC SLEEVE RESECTION N/A 03/03/2019   Procedure: LAPAROSCOPIC GASTRIC SLEEVE RESECTION, Upper Endo, ERAS Pathway;  Surgeon: Greer Pickerel, MD;  Location: [facility];  Service: General;  Laterality: N/A;   LEFT HEART CATHETERIZATION WITH CORONARY ANGIOGRAM N/A 02/10/2013   Procedure: LEFT HEART CATHETERIZATION WITH CORONARY ANGIOGRAM;  Surgeon: Burnell Blanks, MD;  Location: [facility] CATH LAB;  Service: Cardiovascular;  Laterality: N/A;   LUMBAR LAMINECTOMY N/A 07/16/2018   Procedure: LEFT L4-5 REDO PARTIAL LUMBAR HEMILAMINECTOMY WITH FORAMINOTOMY LEFT L4;  Surgeon: Jessy Oto, MD;  Location: [place];  Service:  Orthopedics;  Laterality: N/A;   LUMBAR LAMINECTOMY/DECOMPRESSION MICRODISCECTOMY N/A 01/27/2014   Procedure: CENTRAL LUMBAR LAMINECTOMY L4-5 AND L3-4;  Surgeon: Jessy Oto, MD;  Location: [place];  Service: Orthopedics;  Laterality: N/A;   ORIF ANKLE FRACTURE Right 09/02/2015   Procedure: OPEN REDUCTION INTERNAL FIXATION (ORIF) ANKLE FRACTURE;  Surgeon: Newt Minion, MD;  Location: [place];  Service: Orthopedics;  Laterality: Right;   PERIPHERALLY INSERTED CENTRAL CATHETER INSERTION  09/02/2015   POLYPECTOMY     REVERSE SHOULDER ARTHROPLASTY Left 03/17/2021   Procedure: LEFT REVERSE SHOULDER ARTHROPLASTY;  Surgeon: Meredith Pel, MD;  Location: [place];  Service: Orthopedics;  Laterality: Left;   ROTATOR CUFF REPAIR Left    x 2   ROTATOR CUFF REPAIR Right    x 3   SHOULDER ARTHROSCOPY W/ ROTATOR CUFF REPAIR Bilateral    "3 on the right; 1 on the left"   SKIN SPLIT GRAFT Right 10/01/2015   Procedure: RIGHT ANKLE APPLY SKIN GRAFT SPLIT THICKNESS;  Surgeon: Newt Minion, MD;  Location: [place];  Service: Orthopedics;  Laterality: Right;   TONSILLECTOMY     TOOTH EXTRACTION     TOTAL HIP ARTHROPLASTY Right 04/17/2018   TOTAL HIP ARTHROPLASTY Right 04/17/2018   Procedure: RIGHT TOTAL HIP ARTHROPLASTY;  Surgeon: Newt Minion, MD;  Location: [place];  Service: Orthopedics;  Laterality: Right;   TOTAL HIP ARTHROPLASTY Left  06/06/2019   Procedure: LEFT TOTAL HIP ARTHROPLASTY ANTERIOR APPROACH;  Surgeon: Mcarthur Rossetti, MD;  Location: [place];  Service: Orthopedics;  Laterality: Left;   TOTAL HIP REVISION Left 05/24/2019   TOTAL KNEE ARTHROPLASTY Bilateral [id number]   UMBILICAL HERNIA REPAIR     UHR   UPPER GASTROINTESTINAL ENDOSCOPY  2016   URETHRAL DILATION  X 4   VASECTOMY     WISDOM TOOTH EXTRACTION     Social History   Occupational History   Occupation: disabled since 2006 due to ortho. 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

## 2021-04-05 ENCOUNTER — Telehealth: Payer: Self-pay | Admitting: Orthopedic Surgery

## 2021-04-05 NOTE — Telephone Encounter (Signed)
I have messaged Sonia Side asking him to check on this

## 2021-04-05 NOTE — Telephone Encounter (Signed)
Patient called advised he still have not heard anything from (PT) yet. Patient asked when will someone contact him? The number to contact patient is [phone]

## 2021-04-06 ENCOUNTER — Encounter (INDEPENDENT_AMBULATORY_CARE_PROVIDER_SITE_OTHER): Payer: Self-pay | Admitting: Family Medicine

## 2021-04-06 ENCOUNTER — Other Ambulatory Visit: Payer: Self-pay

## 2021-04-06 ENCOUNTER — Ambulatory Visit (INDEPENDENT_AMBULATORY_CARE_PROVIDER_SITE_OTHER): Payer: Medicare Other | Admitting: Family Medicine

## 2021-04-06 VITALS — BP 118/68 | HR 92 | Temp 98.2°F | Ht 70.0 in | Wt 302.0 lb

## 2021-04-06 DIAGNOSIS — Z9884 Bariatric surgery status: Secondary | ICD-10-CM | POA: Diagnosis not present

## 2021-04-06 DIAGNOSIS — R0602 Shortness of breath: Secondary | ICD-10-CM

## 2021-04-06 DIAGNOSIS — I10 Essential (primary) hypertension: Secondary | ICD-10-CM | POA: Diagnosis not present

## 2021-04-06 DIAGNOSIS — G4733 Obstructive sleep apnea (adult) (pediatric): Secondary | ICD-10-CM | POA: Diagnosis not present

## 2021-04-06 DIAGNOSIS — R5383 Other fatigue: Secondary | ICD-10-CM

## 2021-04-06 DIAGNOSIS — E1165 Type 2 diabetes mellitus with hyperglycemia: Secondary | ICD-10-CM | POA: Diagnosis not present

## 2021-04-06 DIAGNOSIS — I2510 Atherosclerotic heart disease of native coronary artery without angina pectoris: Secondary | ICD-10-CM

## 2021-04-06 DIAGNOSIS — E559 Vitamin D deficiency, unspecified: Secondary | ICD-10-CM | POA: Diagnosis not present

## 2021-04-06 DIAGNOSIS — F419 Anxiety disorder, unspecified: Secondary | ICD-10-CM | POA: Diagnosis not present

## 2021-04-06 DIAGNOSIS — Z79899 Other long term (current) drug therapy: Secondary | ICD-10-CM | POA: Diagnosis not present

## 2021-04-06 DIAGNOSIS — F32A Depression, unspecified: Secondary | ICD-10-CM | POA: Diagnosis not present

## 2021-04-06 DIAGNOSIS — Z6841 Body Mass Index (BMI) 40.0 and over, adult: Secondary | ICD-10-CM

## 2021-04-06 NOTE — Telephone Encounter (Signed)
I have called Sonia Side and he will pick up info for referral. He will let me know if they are unable to accommodate patient.

## 2021-04-07 ENCOUNTER — Ambulatory Visit: Payer: Medicare Other | Admitting: Internal Medicine

## 2021-04-07 NOTE — Progress Notes (Signed)
Dear Dr. Jenny Reichmann,   Thank you for referring Travis CHRISTOPOULOS Sr. to our clinic. The following note includes my evaluation and treatment recommendations.  Chief Complaint:   Travis [name] (MR# [id number]) is a 70 y.o. male who presents for evaluation and treatment of obesity and related comorbidities. Current BMI is Body mass index is 43.33 kg/m. Travis [name] has been struggling with his weight for many years and has been unsuccessful in either losing weight, maintaining weight loss, or reaching his healthy weight goal.  Travis [name] is currently in the action stage of change and ready to dedicate time achieving and maintaining a healthier weight. Travis [name] is interested in becoming our patient and working on intensive lifestyle modifications including (but not limited to) diet and exercise for weight loss.  Travis [name] had gastric sleeve in 2020 and lost 80 lbs in a year. He still has significant restriction living. He is living with his niece and her family but will be moving into his own apartment on December 2. Breakfast is sausage patty on English muffin or sausage, egg, and cheese croissant. Lunch is Meals on Wheels and supper is hamburger/hotdog or seafood or baked chicken.   Travis [name]'s habits were reviewed today and are as follows: his desired weight loss is 77 lbs, he has been heavy most of his life, he started gaining weight at 70 years old, his heaviest weight ever was 380 pounds, he has significant food cravings issues, he is frequently drinking liquids with calories, he frequently makes poor food choices, he frequently eats larger portions than normal, he has binge eating behaviors, and he struggles with emotional eating.  Depression Screen Travis [name]'s Food and Mood (modified PHQ-9) score was 17.  Depression screen PHQ 2/9 04/06/2021  Decreased Interest 2  Down, Depressed, Hopeless 3  PHQ - 2 Score 5  Altered sleeping 0  Tired, decreased energy 2  Change in appetite 3  Feeling  bad or failure about yourself  3  Trouble concentrating 2  Moving slowly or fidgety/restless 2  Suicidal thoughts 0  PHQ-9 Score 17  Difficult doing work/chores Not difficult at all  Some recent data might be hidden   Subjective:   1. Other fatigue Travis [name] admits to daytime somnolence and denies waking up still tired. Patent has a history of symptoms of daytime fatigue, morning fatigue, and Epworth sleepiness scale. Travis [name] generally gets 6 hours of sleep per night, and states that he has generally restful sleep. Snoring is not present. Apneic episodes are not present. Epworth Sleepiness Score is 14. Travis [name] has a history of OSA, as well as allergic rhinitis.  2. SOB (shortness of breath) Travis [name] notes increasing shortness of breath with exercising and seems to be worsening over time with weight gain. He notes getting out of breath sooner with activity than he used to. This has gotten worse recently. Travis [name] denies shortness of breath at rest or orthopnea. Travis [name] has a history of OSA, as well as allergic rhinitis.  3. OSA (obstructive sleep apnea) Travis [name] has a significant Epworth sleep score with diagnosis. His CPAP compliance is minimal, as pt doesn't have the strap to go around his head and doesn't have oxygen.  4. Polypharmacy, at risk for medication usage Travis [name] is on Buprenorphine patch, Robaxin, gabapentin, and hydrocodone. He sees Marjean Donna, PA-C, who prescribes those meds.  5. Anxiety and depression Pt denies suicidal or homicidal ideations. He is on Abilify and Cymbalta. He has frequent emotional eating triggers- stressed, sad, reward, bored, guilty, angry,  comfort. He sees Dr. Adele Schilder.  6. Type 2 diabetes mellitus with hyperglycemia, without long-term current use of insulin (Travis [name]) Travis [name] is not on medication. He was diagnosed at least 10 years ago. His last A1c was 5.8. He was previously on Metformin.  7. Vitamin D deficiency Travis [name] is not on Vit D supplementation and  reports fatigue. His last Vit D level was 53.07.  8. Essential hypertension BP very well controlled. He is on HCTZ and losartan and is managed by cardiology.  9. History of bariatric surgery Travis [name] had gastric sleeve placed in 2020 and lost down to 240 lbs. He reports emotional eating attributing to weight regain.  10. Coronary artery disease involving native coronary artery of native heart, unspecified whether angina present Pt has a history of 9 stents. He is on Effient and aspirin and is managed by Dr. Angelena Form every 6 months.  Assessment/Plan:   1. Other fatigue Travis [name] does feel that his weight is causing his energy to be lower than it should be. Fatigue may be related to obesity, depression or many other causes. Labs will be ordered, and in the meanwhile, Travis [name] will focus on self care including making healthy food choices, increasing physical activity and focusing on stress reduction. Check labs today.  - T3 - T4, free - TSH  2. SOB (shortness of breath) Travis [name] does feel that he gets out of breath more easily that he used to when he exercises. Travis [name]'s shortness of breath appears to be obesity related and exercise induced. He has agreed to work on weight loss and gradually increase exercise to treat his exercise induced shortness of breath. Will continue to monitor closely.  3. OSA (obstructive sleep apnea) Intensive lifestyle modifications are the first line treatment for this issue. We discussed several lifestyle modifications today and he will continue to work on diet, exercise and weight loss efforts. We will continue to monitor. Orders and follow up as documented in patient record. Follow up at next appt.  4. Polypharmacy, at risk for medication usage Follow up with pain management.  5. Anxiety and depression Behavior modification techniques were discussed today to help Travis [name] deal with his anxiety.  Orders and follow up as documented in patient record. Follow up with  psychiatrist.  6. Type 2 diabetes mellitus with hyperglycemia, without long-term current use of insulin ([facility]CC) Good blood sugar control is important to decrease the likelihood of diabetic complications such as nephropathy, neuropathy, limb loss, blindness, coronary artery disease, and death. Intensive lifestyle modification including diet, exercise and weight loss are the first line of treatment for diabetes. Check labs today.  - Insulin, random - Hemoglobin A1c - Lipid Panel With LDL/HDL Ratio  7. Vitamin D deficiency Low Vitamin D level contributes to fatigue and are associated with obesity, breast, and colon cancer. He will follow-up for routine testing of Vitamin D, at least 2-3 times per year to avoid over-replacement. Check labs today.  - VITAMIN D 25 Hydroxy (Vit-D Deficiency, Fractures)  8. Essential hypertension Travis [name] is working on healthy weight loss and exercise to improve blood pressure control. We will watch for signs of hypotension as he continues his lifestyle modifications. Check labs today.  - Comprehensive metabolic panel  9. History of bariatric surgery Follow up at subsequent appt.  10. Coronary artery disease involving native coronary artery of native heart, unspecified whether angina present Follow up with Dr. Angelena Form.  11. Obesity with BMI of 43.3  Travis [name] is currently in the action stage of change and his goal  is to continue with weight loss efforts. I recommend Travis [name] begin the structured treatment plan as follows:  He has agreed to the Category 4 Plan.  Exercise goals: No exercise has been prescribed at this time.   Behavioral modification strategies: increasing lean protein intake, meal planning and cooking strategies, keeping healthy foods in the home, and planning for success.  He was informed of the importance of frequent follow-up visits to maximize his success with intensive lifestyle modifications for his multiple health conditions. He was  informed we would discuss his lab results at his next visit unless there is a critical issue that needs to be addressed sooner. Travis [name] agreed to keep his next visit at the agreed upon time to discuss these results.  Objective:   Blood pressure 118/68, pulse 92, temperature 98.2 F (36.8 C), height 5' 10"  (1.778 m), weight (!) 302 lb (137 kg), SpO2 97 %. Body mass index is 43.33 kg/m.  EKG: Normal sinus rhythm, rate 86 (03/14/2021).  Indirect Calorimeter completed today shows a VO2 of 416 and a REE of 2880.    General: Cooperative, alert, well developed, in no acute distress. HEENT: Conjunctivae and lids unremarkable. Cardiovascular: Regular rhythm.  Lungs: Normal work of breathing. Neurologic: No focal deficits.   Lab Results  Component Value Date   CREATININE 1.14 03/18/2021   BUN 17 03/18/2021   NA 136 03/18/2021   K 3.9 03/18/2021   CL 103 03/18/2021   CO2 27 03/18/2021   Lab Results  Component Value Date   ALT 11 12/01/2020   AST 22 12/01/2020   ALKPHOS 58 12/01/2020   BILITOT 0.4 12/01/2020   Lab Results  Component Value Date   HGBA1C 5.8 (H) 03/09/2021   HGBA1C 6.1 12/01/2020   HGBA1C 5.7 (A) 02/24/2020   HGBA1C 5.9 02/24/2020   HGBA1C 5.3 07/24/2019   No results found for: INSULIN Lab Results  Component Value Date   TSH 2.52 02/24/2020   Lab Results  Component Value Date   CHOL 113 12/01/2020   HDL 42.30 12/01/2020   LDLCALC 54 12/01/2020   TRIG 85.0 12/01/2020   CHOLHDL 3 12/01/2020   Lab Results  Component Value Date   WBC 6.6 03/19/2021   HGB 10.6 (L) 03/19/2021   HCT 31.6 (L) 03/19/2021   MCV 90.5 03/19/2021   PLT 218 03/19/2021   Lab Results  Component Value Date   IRON 49 12/01/2020   TIBC 186 (L) 10/18/2014   FERRITIN 25.6 12/01/2020    Attestation Statements:   Reviewed by clinician on day of visit: allergies, medications, problem list, medical history, surgical history, family history, social history, and previous encounter  notes.  Coral Ceo, CMA, am acting as transcriptionist for Coralie Common, MD.  This is the patient's first visit at [facility]. The patient's NEW PATIENT PACKET was reviewed at length. Included in the packet: current and past health history, medications, allergies, ROS, gynecologic history (women only), surgical history, family history, social history, weight history, weight loss surgery history (for those that have had weight loss surgery), nutritional evaluation, mood and food questionnaire, PHQ9, Epworth questionnaire, sleep habits questionnaire, patient life and health improvement goals questionnaire. These will all be scanned into the patient's chart under media.   During the visit, I independently reviewed the patient's EKG, bioimpedance scale results, and indirect calorimeter results. I used this information to tailor a meal plan for the patient that will help him to lose weight and will improve his obesity-related conditions going  forward. I performed a medically necessary appropriate examination and/or evaluation. I discussed the assessment and treatment plan with the patient. The patient was provided an opportunity to ask questions and all were answered. The patient agreed with the plan and demonstrated an understanding of the instructions. Labs were ordered at this visit and will be reviewed at the next visit unless more critical results need to be addressed immediately. Clinical information was updated and documented in the EMR.   Time spent on visit including pre-visit chart review and post-visit care was 40 minutes.   A separate 15 minutes was spent on risk counseling (see above).  I have reviewed the above documentation for accuracy and completeness, and I agree with the above. - Coralie Common, MD

## 2021-04-08 DIAGNOSIS — R310 Gross hematuria: Secondary | ICD-10-CM | POA: Diagnosis not present

## 2021-04-08 DIAGNOSIS — R1084 Generalized abdominal pain: Secondary | ICD-10-CM | POA: Diagnosis not present

## 2021-04-11 DIAGNOSIS — R5383 Other fatigue: Secondary | ICD-10-CM | POA: Diagnosis not present

## 2021-04-11 DIAGNOSIS — E1159 Type 2 diabetes mellitus with other circulatory complications: Secondary | ICD-10-CM | POA: Diagnosis not present

## 2021-04-11 DIAGNOSIS — E118 Type 2 diabetes mellitus with unspecified complications: Secondary | ICD-10-CM | POA: Diagnosis not present

## 2021-04-11 DIAGNOSIS — I152 Hypertension secondary to endocrine disorders: Secondary | ICD-10-CM | POA: Diagnosis not present

## 2021-04-11 DIAGNOSIS — E559 Vitamin D deficiency, unspecified: Secondary | ICD-10-CM | POA: Diagnosis not present

## 2021-04-12 ENCOUNTER — Encounter: Payer: Self-pay | Admitting: Physician Assistant

## 2021-04-12 LAB — COMPREHENSIVE METABOLIC PANEL
ALT: 8 IU/L (ref 0–44)
AST: 17 IU/L (ref 0–40)
Albumin/Globulin Ratio: 1.7 (ref 1.2–2.2)
Albumin: 4.1 g/dL (ref 3.8–4.8)
Alkaline Phosphatase: 82 IU/L (ref 44–121)
BUN/Creatinine Ratio: 19 (ref 10–24)
BUN: 19 mg/dL (ref 8–27)
Bilirubin Total: 0.4 mg/dL (ref 0.0–1.2)
CO2: 24 mmol/L (ref 20–29)
Calcium: 9.3 mg/dL (ref 8.6–10.2)
Chloride: 102 mmol/L (ref 96–106)
Creatinine, Ser: 0.98 mg/dL (ref 0.76–1.27)
Globulin, Total: 2.4 g/dL (ref 1.5–4.5)
Glucose: 114 mg/dL — ABNORMAL HIGH (ref 70–99)
Potassium: 4 mmol/L (ref 3.5–5.2)
Sodium: 141 mmol/L (ref 134–144)
Total Protein: 6.5 g/dL (ref 6.0–8.5)
eGFR: 84 mL/min/{1.73_m2} (ref >59)

## 2021-04-12 LAB — LIPID PANEL WITH LDL/HDL RATIO
Cholesterol, Total: 125 mg/dL (ref 100–199)
HDL: 48 mg/dL (ref >39)
LDL Chol Calc (NIH): 59 mg/dL (ref 0–99)
LDL/HDL Ratio: 1.2 ratio (ref 0.0–3.6)
Triglycerides: 96 mg/dL (ref 0–149)
VLDL Cholesterol Cal: 18 mg/dL (ref 5–40)

## 2021-04-12 LAB — HEMOGLOBIN A1C
Est. average glucose Bld gHb Est-mCnc: 114 mg/dL
Hgb A1c MFr Bld: 5.6 % (ref 4.8–5.6)

## 2021-04-12 LAB — TSH: TSH: 1.96 u[IU]/mL (ref 0.450–4.500)

## 2021-04-12 LAB — T3: T3, Total: 123 ng/dL (ref 71–180)

## 2021-04-12 LAB — VITAMIN D 25 HYDROXY (VIT D DEFICIENCY, FRACTURES): Vit D, 25-Hydroxy: 51 ng/mL (ref 30.0–100.0)

## 2021-04-12 LAB — INSULIN, RANDOM: INSULIN: 12.2 u[IU]/mL (ref 2.6–24.9)

## 2021-04-12 LAB — T4, FREE: Free T4: 1.22 ng/dL (ref 0.82–1.77)
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
## 2021-04-13 ENCOUNTER — Ambulatory Visit (INDEPENDENT_AMBULATORY_CARE_PROVIDER_SITE_OTHER): Payer: Medicare Other | Admitting: Physician Assistant

## 2021-04-13 ENCOUNTER — Other Ambulatory Visit: Payer: Self-pay

## 2021-04-13 ENCOUNTER — Encounter: Payer: Self-pay | Admitting: Physician Assistant

## 2021-04-13 VITALS — BP 130/70 | HR 83 | Ht 70.0 in | Wt 296.0 lb

## 2021-04-13 DIAGNOSIS — I2510 Atherosclerotic heart disease of native coronary artery without angina pectoris: Secondary | ICD-10-CM

## 2021-04-13 DIAGNOSIS — I77819 Aortic ectasia, unspecified site: Secondary | ICD-10-CM | POA: Diagnosis not present

## 2021-04-13 DIAGNOSIS — E785 Hyperlipidemia, unspecified: Secondary | ICD-10-CM

## 2021-04-13 DIAGNOSIS — D649 Anemia, unspecified: Secondary | ICD-10-CM

## 2021-04-13 DIAGNOSIS — I10 Essential (primary) hypertension: Secondary | ICD-10-CM | POA: Diagnosis not present

## 2021-04-13 DIAGNOSIS — R001 Bradycardia, unspecified: Secondary | ICD-10-CM

## 2021-04-13 NOTE — Patient Instructions (Addendum)
Medication Instructions:  Your physician recommends that you continue on your current medications as directed. Please refer to the Current Medication list given to you today.  *If you need a refill on your cardiac medications before your next appointment, please call your pharmacy*   Lab Work: TODAY:  CBC  If you have labs (blood work) drawn today and your tests are completely normal, you will receive your results only by: [place] (if you have MyChart) OR A paper copy in the mail If you have any lab test that is abnormal or we need to change your treatment, we will call you to review the results.   Testing/Procedures: None ordered    Follow-Up: At [facility], you and your health needs are our priority.  As part of our continuing mission to provide you with exceptional heart care, we have created designated Provider Care Teams.  These Care Teams include your primary Cardiologist (physician) and Advanced Practice Providers (APPs -  Physician Assistants and Nurse Practitioners) who all work together to provide you with the care you need, when you need it.  We recommend signing up for the patient portal called "MyChart".  Sign up information is provided on this After Visit Summary.  MyChart is used to connect with patients for Virtual Visits (Telemedicine).  Patients are able to view lab/test results, encounter notes, upcoming appointments, etc.  Non-urgent messages can be sent to your provider as well.   To learn more about what you can do with MyChart, go to [email or web address].    Your next appointment:   6 month(s)  The format for your next appointment:   In Person  Provider:   Lauree Chandler, MD  or Melina Copa, PA-C         Other Instructions  Patients taking blood thinners should generally limit medicines from the NSAID class that include ibuprofen, Advil, Motrin, naproxen, and Aleve due to risk of stomach bleeding. You are on a medicine called celecoxib  that also can have this effect so just make sure the prescriber knows you are on blood thinners and that you monitor for any abnormal bleeding. If you notice any bleeding such as blood in stool, black tarry stools, blood in urine, nosebleeds or any other unusual bleeding, call your doctor immediately. It is not normal to have this kind of bleeding while on a blood thinner and usually indicates there is an underlying problem with one of your body systems that needs to be checked out.   I would like you to discuss your prior CT Angio scan findings from March 2020 with your primary care provider. While reviewing your records I came across this finding which stated, "Small amount of soft tissue density along the medial aspect of the right fourth posterior rib with some associated cortical thickening of the rib, new since August 2019. No acute fracture, focal bony lesion, or bony destruction. Appearance is nonspecific and although the reactive sclerosis could potentially be seen with chronic costovertebral and costotransverse osteoarthritis, chronic infection, or Paget's disease, the adjacent soft tissue density would be unusual for these etiologies and is therefore concerning for potential underlying neoplasm such as a neurogenic tumor, metastasis, or lymphoma. Appropriate oncologic workup is suggested. Short-term interval follow-up CT of the chest with contrast to evaluate for interval change is also recommended."

## 2021-04-14 LAB — CBC
Hematocrit: 32.9 % — ABNORMAL LOW (ref 37.5–51.0)
Hemoglobin: 11.4 g/dL — ABNORMAL LOW (ref 13.0–17.7)
MCH: 31.1 pg (ref 26.6–33.0)
MCHC: 34.7 g/dL (ref 31.5–35.7)
MCV: 90 fL (ref 79–97)
Platelets: 327 10*3/uL (ref 150–450)
RBC: 3.66 x10E6/uL — ABNORMAL LOW (ref 4.14–5.80)
RDW: 13.3 % (ref 11.6–15.4)
WBC: 5.7 10*3/uL (ref 3.4–10.8)

## 2021-04-17 DIAGNOSIS — Z96612 Presence of left artificial shoulder joint: Secondary | ICD-10-CM | POA: Diagnosis not present

## 2021-04-17 DIAGNOSIS — K219 Gastro-esophageal reflux disease without esophagitis: Secondary | ICD-10-CM | POA: Diagnosis not present

## 2021-04-17 DIAGNOSIS — Z89511 Acquired absence of right leg below knee: Secondary | ICD-10-CM | POA: Diagnosis not present

## 2021-04-17 DIAGNOSIS — I2510 Atherosclerotic heart disease of native coronary artery without angina pectoris: Secondary | ICD-10-CM | POA: Diagnosis not present

## 2021-04-17 DIAGNOSIS — M4800 Spinal stenosis, site unspecified: Secondary | ICD-10-CM | POA: Diagnosis not present

## 2021-04-17 DIAGNOSIS — G4733 Obstructive sleep apnea (adult) (pediatric): Secondary | ICD-10-CM | POA: Diagnosis not present

## 2021-04-17 DIAGNOSIS — Z471 Aftercare following joint replacement surgery: Secondary | ICD-10-CM | POA: Diagnosis not present

## 2021-04-17 DIAGNOSIS — Z7982 Long term (current) use of aspirin: Secondary | ICD-10-CM | POA: Diagnosis not present

## 2021-04-17 DIAGNOSIS — E1151 Type 2 diabetes mellitus with diabetic peripheral angiopathy without gangrene: Secondary | ICD-10-CM | POA: Diagnosis not present

## 2021-04-17 DIAGNOSIS — N200 Calculus of kidney: Secondary | ICD-10-CM | POA: Diagnosis not present

## 2021-04-17 DIAGNOSIS — I509 Heart failure, unspecified: Secondary | ICD-10-CM | POA: Diagnosis not present

## 2021-04-17 DIAGNOSIS — E785 Hyperlipidemia, unspecified: Secondary | ICD-10-CM | POA: Diagnosis not present

## 2021-04-17 DIAGNOSIS — Z96641 Presence of right artificial hip joint: Secondary | ICD-10-CM | POA: Diagnosis not present

## 2021-04-17 DIAGNOSIS — I252 Old myocardial infarction: Secondary | ICD-10-CM | POA: Diagnosis not present

## 2021-04-17 DIAGNOSIS — Z791 Long term (current) use of non-steroidal anti-inflammatories (NSAID): Secondary | ICD-10-CM | POA: Diagnosis not present

## 2021-04-17 DIAGNOSIS — G8929 Other chronic pain: Secondary | ICD-10-CM | POA: Diagnosis not present

## 2021-04-18 ENCOUNTER — Ambulatory Visit (INDEPENDENT_AMBULATORY_CARE_PROVIDER_SITE_OTHER): Payer: Medicare Other

## 2021-04-18 ENCOUNTER — Other Ambulatory Visit: Payer: Self-pay

## 2021-04-18 DIAGNOSIS — M7918 Myalgia, other site: Secondary | ICD-10-CM | POA: Diagnosis not present

## 2021-04-18 DIAGNOSIS — M961 Postlaminectomy syndrome, not elsewhere classified: Secondary | ICD-10-CM | POA: Diagnosis not present

## 2021-04-18 DIAGNOSIS — G894 Chronic pain syndrome: Secondary | ICD-10-CM | POA: Diagnosis not present

## 2021-04-18 DIAGNOSIS — I2510 Atherosclerotic heart disease of native coronary artery without angina pectoris: Secondary | ICD-10-CM

## 2021-04-18 DIAGNOSIS — I10 Essential (primary) hypertension: Secondary | ICD-10-CM

## 2021-04-18 NOTE — Patient Instructions (Signed)
Visit Information  Following are the goals we discussed today:   Track and Manage My Blood Pressure - HTN   Timeframe:  Long-Range Goal Priority:  High Start Date:   01/14/2021                          Expected End Date:  04/16/2022                     Follow Up Date March 2023   - check blood pressure daily - choose a place to take my blood pressure (home, clinic or office, retail store) - write blood pressure results in a log or diary    Why is this important?   You won't feel high blood pressure, but it can still hurt your blood vessels.  High blood pressure can cause heart or kidney problems. It can also cause a stroke.  Making lifestyle changes like losing a little weight or eating less salt will help.  Checking your blood pressure at home and at different times of the day can help to control blood pressure.  If the doctor prescribes medicine remember to take it the way the doctor ordered.  Call the office if you cannot afford the medicine or if there are questions about it.   Plan: Telephone follow up appointment with care management team member scheduled for:  4 months  The patient has been provided with contact information for the care management team and has been advised to call with any health related questions or concerns.   Tomasa Blase, PharmD Clinical Pharmacist, [facility]   Please call the care guide team at [phone] if you need to cancel or reschedule your appointment.   Patient verbalizes understanding of instructions provided today and agrees to view in [place].

## 2021-04-18 NOTE — Progress Notes (Signed)
Chronic Care Management Pharmacy Note  04/18/2021 Name:  Travis RAMCHARAN Sr. MRN:  [id number] DOB:  June 10, 1950  Summary: - Patient reports he feels he is doing well, progressing after his most recent surgery (shoulder replacement 03/18/2021) - beginning to increase his ROM  -Reports that he has been checking blood pressure, averaging 140/69-70 - no issues or concerns regarding BP at this time  -No longer has issues with rash from butrans patch  Recommendations/Changes made from today's visit: -Recommending no changes to medications at this time, patient to continue to monitor blood pressures at home and reach out should BG increase >140/90 on average   Subjective: Travis Kem. is an 70 y.o. year old male who is a primary patient of Travis [name], Travis Oris, MD.  The CCM team was consulted for assistance with disease management and care coordination needs.    Engaged with patient face to face for initial visit in response to provider referral for pharmacy case management and/or care coordination services.   Consent to Services:  The patient was given the following information about Chronic Care Management services today, agreed to services, and gave verbal consent: 1. CCM service includes personalized support from designated clinical staff supervised by the primary care provider, including individualized plan of care and coordination with other care providers 2. 24/7 contact phone numbers for assistance for urgent and routine care needs. 3. Service will only be billed when office clinical staff spend 20 minutes or more in a month to coordinate care. 4. Only one practitioner may furnish and bill the service in a calendar month. 5.The patient may stop CCM services at any time (effective at the end of the month) by phone call to the office staff. 6. The patient will be responsible for cost sharing (co-pay) of up to 20% of the service fee (after annual deductible is met). Patient agreed to  services and consent obtained.  Patient Care Team: [name], MD as PCP - General (Internal Medicine) Burnell Blanks, MD as PCP - Cardiology (Cardiology) [name], MD as Attending Physician (Internal Medicine) Newt Minion, MD as Consulting Physician (Orthopedic Surgery) Greer Pickerel, MD as Consulting Physician (General Surgery) Pyrtle, Lajuan Lines, MD as Consulting Physician (Gastroenterology) Jessy Oto, MD as Consulting Physician (Orthopedic Surgery) Tat, Eustace Quail, DO as Consulting Physician (Neurology) Kathlee Nations, MD as Consulting Physician (Psychiatry) Park Breed, MD as Referring Physician Delice Bison Darnelle Maffucci, [facility] as Pharmacist (Pharmacist)  Recent office visits:  03/17/2021 - Dr. Jenny [name] - cleared for surgery 03/18/2021 - ok to trial voltaren gel for pain as needed  02/25/2021 - Dr. Jenny [name] - ordered oxygen from [employer] - 2L nightly, ambien increased to 73m daily   Recent consult visits:  04/13/2021 - [facility] - Cardiology - f/u for bradycardia - no medication changes - f/u in 6 months  04/06/2021- [name] stop benztropine, docusate, fluticasone, and phentermine  11/ 03/14/2021 - Dr. VIrish Lack- Cardiology - no changes, cleared for surgery    Hospital visits:  Medication Reconciliation was completed by comparing discharge summary, patient's EMR and Pharmacy list, and upon discussion with patient.   Admitted to the hospital on 07/27/20 due to Congenital spinal stenosis of lumbar region - spinal fusion surgery. patient underwent right L1-2 and Left L2-3, L3-4, L4-5 and L5-S1 facetectomy decompressing the areas of spondylosis causing foramenal stenosis. Discharge date was 08/02/20[facility] -started None noted   Medication Changes at Hospital Discharge: -Changed None noted   Medications Discontinued at Hospital Discharge: -Stopped  None noted   Medications that remain the same after Hospital Discharge:??  -All other medications will remain the same.    Objective:  Lab Results  Component Value Date   CREATININE 0.98 04/11/2021   BUN 19 04/11/2021   GFR 60.32 12/01/2020   GFRNONAA >60 03/18/2021   GFRAA >60 06/07/2019   NA 141 04/11/2021   K 4.0 04/11/2021   CALCIUM 9.3 04/11/2021   CO2 24 04/11/2021   GLUCOSE 114 (H) 04/11/2021    Lab Results  Component Value Date/Time   HGBA1C 5.6 04/11/2021 09:49 AM   HGBA1C 5.8 (H) 03/09/2021 03:00 PM   GFR 60.32 12/01/2020 01:48 PM   GFR 63.80 02/24/2020 04:13 PM   MICROALBUR <0.7 07/24/2019 02:47 PM   MICROALBUR 1.6 06/03/2018 01:33 PM    Last diabetic Eye exam:  Lab Results  Component Value Date/Time   HMDIABEYEEXA No Retinopathy 11/30/2020 12:00 AM    Last diabetic Foot exam:  No results found for: HMDIABFOOTEX   Lab Results  Component Value Date   CHOL 125 04/11/2021   HDL 48 04/11/2021   LDLCALC 59 04/11/2021   TRIG 96 04/11/2021   CHOLHDL 3 12/01/2020    Hepatic Function Latest Ref Rng & Units 04/11/2021 12/01/2020 07/23/2020  Total Protein 6.0 - 8.5 g/dL 6.5 6.6 5.9(L)  Albumin 3.8 - 4.8 g/dL 4.1 4.1 3.5  AST 0 - 40 IU/L 17 22 19   ALT 0 - 44 IU/L 8 11 18   Alk Phosphatase 44 - 121 IU/L 82 58 51  Total Bilirubin 0.0 - 1.2 mg/dL 0.4 0.4 0.7  Bilirubin, Direct 0.0 - 0.3 mg/dL - 0.1 -    Lab Results  Component Value Date/Time   TSH 1.960 04/11/2021 09:49 AM   TSH 2.52 02/24/2020 04:13 PM   FREET4 1.22 04/11/2021 09:49 AM    CBC Latest Ref Rng & Units 04/13/2021 03/19/2021 03/18/2021  WBC 3.4 - 10.8 x10E3/uL 5.7 6.6 8.9  Hemoglobin 13.0 - 17.7 g/dL 11.4(L) 10.6(L) 11.1(L)  Hematocrit 37.5 - 51.0 % 32.9(L) 31.6(L) 32.4(L)  Platelets 150 - 450 x10E3/uL 327 218 234    Lab Results  Component Value Date/Time   VD25OH 51.0 04/11/2021 09:49 AM   VD25OH 53.07 12/01/2020 01:48 PM   VD25OH 29.84 (L) 02/24/2020 04:13 PM    Clinical ASCVD: Yes   The ASCVD Risk score (Arnett DK, et al., 2019) failed to calculate for the following reasons:   The patient has a prior MI or stroke diagnosis    Depression screen [facility] 2/9 04/06/2021 09/17/2020 09/17/2020  Decreased Interest 2 0 3  Down, Depressed, Hopeless 3 0 3  PHQ - 2 Score 5 0 6  Altered sleeping 0 - 0  Tired, decreased energy 2 - 3  Change in appetite 3 - 3  Feeling bad or failure about yourself  3 - 3  Trouble concentrating 2 - 3  Moving slowly or fidgety/restless 2 - 0  Suicidal thoughts 0 - 0  PHQ-9 Score 17 - 18  Difficult doing work/chores Not difficult at all - -  Some recent data might be hidden   Social History   Tobacco Use  Smoking Status Former   Types: Cigars   Quit date: 08/28/2010   Years since quitting: 10.6  Smokeless Tobacco Never  Tobacco Comments   04/18/2016 "smoked 1 cigar/wk when I did smoke"  BP Readings from Last 3 Encounters:  04/13/21 130/70  04/06/21 118/68  03/20/21 (!) 151/87   Pulse Readings from Last 3 Encounters:  04/13/21 83  04/06/21 92  03/20/21 84   Wt Readings from Last 3 Encounters:  04/13/21 296 lb (134.3 kg)  04/06/21 (!) 302 lb (137 kg)  03/17/21 299 lb (135.6 kg)   BMI Readings from Last 3 Encounters:  04/13/21 42.47 kg/m  04/06/21 43.33 kg/m  03/17/21 41.70 kg/m    Assessment/Interventions: Review of patient past medical history, allergies, medications, health status, including review of consultants reports, laboratory and other test data, was performed as part of comprehensive evaluation and provision of chronic care management services.   SDOH:  (Social Determinants of Health) assessments and interventions performed: Yes  SDOH Screenings   Alcohol Screen: Not on file  Depression (PHQ2-9): Medium Risk   PHQ-2 Score: 17  Financial Resource Strain: Low Risk    Difficulty of Paying Living Expenses: Not very hard  Food Insecurity: Not on file  Housing: Not on file  Physical Activity: Not on file  Social  Connections: Not on file  Stress: Not on file  Tobacco Use: Medium Risk   Smoking Tobacco Use: Former   Smokeless Tobacco Use: Never   Passive Exposure: Not on file  Transportation Needs: Not on file    [place]  No Known Allergies  Medications Reviewed Today     Reviewed by Tomasa Blase, [facility] (Pharmacist) on 04/18/21 at 1506  Med List Status: <None>   Medication Order Taking? Sig Documenting Provider Last Dose Status Informant  albuterol (VENTOLIN HFA) 108 (90 Base) MCG/ACT inhaler [id number]  Inhale 2 puffs into the lungs every 6 (six) hours as needed for wheezing or shortness of breath. [name], MD  Active Self  ARIPiprazole (ABILIFY) 10 MG tablet [id number]  Take 1 tablet (10 mg total) by mouth daily. Kathlee Nations, MD  Active   aspirin EC 81 MG tablet [id number]  Take 81 mg by mouth at bedtime. Swallow whole. [provider]  Active Self  atorvastatin (LIPITOR) 20 MG tablet [id number]  Take 1 tablet (20 mg total) by mouth daily. Burnell Blanks, MD  Active Self  Azelastine-Fluticasone 137-50 MCG/ACT Elizbeth Squires [id number]  Use both nostrils as directed twice daily [name], MD  Active   budesonide-formoterol [facility]) 160-4.5 MCG/ACT inhaler [id number]  Inhale 2 puffs into the lungs 2 (two) times daily. [name], MD  Active Self  buprenorphine [facility]) 20 MCG/HR [place] [id number]  Place onto the skin once a week. [provider]  Active   calcium carbonate (TUMS - DOSED IN MG ELEMENTAL CALCIUM) 500 MG chewable tablet [id number]  Chew 1 tablet by mouth 3 (three) times daily with meals. [provider]  Active Self  celecoxib (CELEBREX) 200 MG capsule [id number]  Take 200 mg by mouth 2 (two) times daily. [provider]  Active   cetirizine (ZYRTEC) 10 MG tablet [id number]  Take 1 tablet (10 mg total) by mouth daily. [name], MD  Active Self  Cholecalciferol (VITAMIN D3 PO) [id number]  Take 2 tablets by mouth at bedtime.  [provider]  Active Self           Med Note Benita Stabile Mar 14, 2021  3:18 PM)    diclofenac Sodium (VOLTAREN) 1 % GEL [id number]  Apply 4 g topically 3 (three) times daily as needed (pain). Jessy Oto, MD  Active  donepezil (ARICEPT) 5 MG tablet [id number]  Take 1 tablet (5 mg total) by mouth at bedtime. [name], MD  Active Self  DULoxetine (CYMBALTA) 20 MG capsule [id number]  Take 1 capsule (20 mg total) by mouth 2 (two) times daily. Kathlee Nations, MD  Active   Ferrous Sulfate (IRON PO) [id number]  Take by mouth. [provider]  Active Self           Med Note Benita Stabile Mar 14, 2021  3:18 PM)    gabapentin (NEURONTIN) 800 MG tablet [id number]  Take 800 mg by mouth 3 (three) times daily. Jessy Oto, MD  Active Self  hydrochlorothiazide (HYDRODIURIL) 25 MG tablet [id number]  Take 1 tablet (25 mg total) by mouth daily. Burnell Blanks, MD  Active Self  HYDROcodone-acetaminophen [facility]) 10-325 MG tablet [id number]  Take 1 tablet by mouth every 6 (six) hours as needed. Meredith Pel, MD  Active   losartan (COZAAR) 50 MG tablet [id number]  Take 1 tablet (50 mg total) by mouth daily. [name], MD  Active Self  melatonin 5 MG TABS [id number]  Take 15 mg by mouth at bedtime. [provider]  Active Self  methocarbamol (ROBAXIN) 500 MG tablet [id number]  Take 1 tablet (500 mg total) by mouth every 8 (eight) hours as needed for muscle spasms. [name], MD  Active Self  MYRBETRIQ 50 MG TB24 tablet [id number] Yes Take 50 mg by mouth at bedtime. [provider] Taking Active Self  Omega-3 Fatty Acids (FISH OIL) 1000 MG CAPS [id number]  Take 2,000 mg by mouth. [provider]  Active   Pancrelipase, Lip-Prot-Amyl, (ZENPEP) 40000-126000 units CPEP [id number]  Take 2 capsules (80,000 units) with meals and 1 capsule (40,000 units) with each snack. PLEASE SCHEDULE A YEARLY FOLLOW UP FOR FURTHER REFILLS. Thank you  Armbruster, Carlota Raspberry, MD  Active Self  prasugrel (EFFIENT) 5 MG TABS tablet [id number]  Take 1 tablet (5 mg total) by mouth daily. Burnell Blanks, MD  Active   tamsulosin [facility]) 0.4 MG CAPS capsule [id number]  Take 0.4 mg by mouth 2 (two) times daily. [provider]  Active Self  Testosterone 1.62 % GEL [id number]  Place 1 Pump onto the skin daily. [name], MD  Active   traZODone (DESYREL) 150 MG tablet [id number]  Take 1 tablet (150 mg total) by mouth at bedtime. Kathlee Nations, MD  Active   zolpidem (AMBIEN) 5 MG tablet [id number]  Take 1 tablet (5 mg total) by mouth at bedtime as needed for sleep. Meredith Pel, MD  Active   Med List Note Dessie Coma, CPhT 08/15/18 1756): Alphonzo Cruise (niece- lives with her) [phone]            Patient Active Problem List   Diagnosis Date Noted   Arthritis of left shoulder region    AV block, [place] 2 03/18/2021   S/P reverse total shoulder arthroplasty, left 03/17/2021   Iliotibial band syndrome of both sides 03/16/2021   Chronic respiratory failure with hypoxia ([place]) 02/26/2021   Dyspnea 01/14/2021   Sinusitis 11/02/2020   Memory loss or impairment 10/11/2020   Right arm pain 09/18/2020   Aortic atherosclerosis ([place]) 09/16/2020   Spondylolisthesis, lumbar region    Status post lumbar spinal fusion 07/27/2020   Nasal sinus polyp 06/16/2020   Vitamin D deficiency 02/24/2020   Cough 07/29/2019   Wheezing 07/29/2019   Possible exposure to STD 07/24/2019  Unilateral primary osteoarthritis, left hip 06/06/2019   Status post total replacement of left hip 06/06/2019   S/P laparoscopic sleeve gastrectomy 03/03/2019   Rash 01/24/2019   Fever 08/15/2018   UTI (urinary tract infection) 08/15/2018   Fall at home, initial encounter 08/15/2018   Chronic anemia 08/15/2018   Hypokalemia 08/15/2018   Bowel incontinence 07/25/2018   Other spondylosis with radiculopathy, lumbar region 07/16/2018    Class: Chronic    Status post lumbar laminectomy 07/16/2018   Status post THR (total hip replacement) 04/17/2018   Unilateral primary osteoarthritis, right hip    Morbid obesity with BMI of 40.0-44.9, adult ([place]) 02/28/2018   Myocardial infarction ([place]) 02/25/2018   Coronary artery disease involving native coronary artery of native heart 02/25/2018   PAD (peripheral artery disease) ([place]) 02/25/2018   S/P BKA (below knee amputation) unilateral, right ([place]) 02/25/2018   Sacroiliitis ([place]) 02/25/2018   SOB (shortness of breath) 01/03/2018   Acute on chronic heart failure ([place]) 01/03/2018   Severe right groin pain 12/20/2017   Morbid obesity ([place]) 10/09/2017   Low back pain 07/13/2017   Leukoplakia, tongue 01/26/2017   Skin lesion 10/20/2016   S/P unilateral BKA (below knee amputation), right ([place]) 06/14/2016   Charcot foot due to diabetes mellitus ([place])    Charcot's arthropathy associated with type 2 diabetes mellitus ([place]) 04/11/2016   Encounter for well adult exam with abnormal findings 11/05/2015   Major depression 09/13/2015   S/P TKR (total knee replacement) bilaterally 09/13/2015   GERD (gastroesophageal reflux disease) 09/08/2015   S/P laparoscopic hernia repair 05/11/2015   PPD positive 04/08/2015   Benign neoplasm of descending colon    Benign neoplasm of cecum    Acute blood loss as cause of postoperative anemia    Chronic anticoagulation    Occult blood positive stool 10/17/2014   General weakness 07/14/2014   Urinary incontinence 07/14/2014   Hypotension during surgery 01/30/2014    Class: Acute   Headache(784.0) 10/15/2013   Spinal stenosis in cervical region 09/26/2013    Class: Chronic   Congenital spinal stenosis of lumbar region 09/26/2013    Class: Chronic   Hand joint pain 06/10/2013   Rotator cuff tear arthropathy of both shoulders 06/10/2013   Skin lesion of cheek 05/01/2013   Pain of right thumb 04/03/2013   Balance disorder 03/12/2013   Gait disorder 03/12/2013   Tremor  03/12/2013   Left hip pain 03/12/2013   Pre-ulcerative corn or callous 02/06/2013   Anxiety 11/12/2011   OSA on CPAP 11/07/2011   Bradycardia 10/20/2011   Insomnia 10/04/2011   Obesity 01/12/2011   Diabetes mellitus type 2 in obese ([place]) 09/27/2010   ERECTILE DYSFUNCTION, ORGANIC 05/30/2010   Degenerative disc disease, lumbar 04/19/2010   SCIATICA, LEFT 04/19/2010   Chronic pain syndrome 10/27/2009   Hyperlipidemia 07/15/2009   Essential hypertension 06/24/2009   Coronary artery disease involving native coronary artery of native heart without angina pectoris 06/24/2009   Allergic rhinitis 06/24/2009   URETHRAL STRICTURE 06/24/2009   DEGENERATIVE JOINT DISEASE 06/24/2009   SHOULDER PAIN, BILATERAL 06/24/2009   FATIGUE 06/24/2009   NEPHROLITHIASIS, HX OF 06/24/2009    Immunization History  Administered Date(s) Administered   Fluad Quad(high Dose 65+) 01/24/2019, 02/24/2020   Influenza Split 06/05/2011, 06/26/2012   Influenza Whole 03/14/2010   Influenza,inj,Quad PF,6+ Mos 02/09/2013, 04/16/2014, 03/11/2015, 04/19/2016, 01/26/2017   Influenza-Unspecified 05/23/2018   PFIZER(Purple Top)SARS-COV-2 Vaccination 08/22/2019, 09/15/2019, 05/12/2020   PPD Test 10/04/2011, 04/07/2015   Pneumococcal Conjugate-13 04/02/2015   Pneumococcal  Polysaccharide-23 02/19/2009, 06/05/2011, 01/28/2014, 09/17/2020   Td 05/22/2004   Tdap 10/17/2014    Conditions to be addressed/monitored:  Hypertension, Hyperlipidemia, GERD, Depression, Anxiety, Bipolar Disorder, BPH, Allergic Rhinitis, Chronic Pain, Low Testosterone, Tremors, Memory Loss, Bowel Incontinency, and Obesity    Care Plan : CCM Care Plan  Updates made by Tomasa Blase, RPH since 04/18/2021 12:00 AM     Problem: Hypertension, Hyperlipidemia, GERD, Depression, Anxiety, Bipolar Disorder, BPH, Allergic Rhinitis, Chronic Pain, Low Testosterone, Tremors, Memory Loss, Bowel Incontinency, and Obesity   Priority: High  Onset Date:  01/14/2021     Long-Range Goal: Disease Management   Start Date: 01/14/2021  Expected End Date: 04/18/2022  This Visit's Progress: On track  Recent Progress: On track  Priority: High  Note:   Current Barriers:  Unable to independently monitor therapeutic efficacy  Pharmacist Clinical Goal(s):  Patient will achieve adherence to monitoring guidelines and medication adherence to achieve therapeutic efficacy through collaboration with PharmD and provider.   Interventions: 1:1 collaboration with [name], MD regarding development and update of comprehensive plan of care as evidenced by provider attestation and co-signature Inter-disciplinary care team collaboration (see longitudinal plan of care) Comprehensive medication review performed; medication list updated in electronic medical record  Hypertension (BP goal <140/90) -Controlled -Current treatment: Losartan 12m - 1 tablet daily Hydrochlorothiazide 271m-1 tablet daily -Medications previously tried: amlodipine, atenolol, benazepril, isosorbide mononitrate,   -Current home readings: averaging 140/69-70 -Current dietary habits: eats a low sodium  -Current exercise habits: none at this time, due to back pain / post op recovery  -Denies hypotensive/hypertensive symptoms -Educated on BP goals and benefits of medications for prevention of heart attack, stroke and kidney damage; Daily salt intake goal < 2300 mg; Importance of home blood pressure monitoring; Proper BP monitoring technique; Symptoms of hypotension and importance of maintaining adequate hydration; -Counseled to monitor BP at home daily, document, and provide log at future appointments -Counseled on diet and exercise extensively Recommended to continue current medication  Hyperlipidemia/ Previous MI/ CAD: (LDL goal < 70) -Controlled Lab Results  Component Value Date   LDLCALC 59 04/11/2021  -Current treatment: Atorvastatin 2025m 1 tablet daily Fish Oils 1000m80m 1 capsule daily Aspirin 81mg66m tablet daily  Prasugrel 5mg -35mtablet daily  -Medications previously tried: n/a  -Current dietary patterns: seafood / lean meat / fresh vegetables -Current exercise habits: none at this time due to back pain -Educated on Cholesterol goals;  Benefits of statin for ASCVD risk reduction; Importance of limiting foods high in cholesterol; -Counseled on diet and exercise extensively Recommended to continue current medication  Depression/Dipolar Disorder / Insomnia (Goal: Promotion of stable mood / quality sleep) -Improved - has started following with a new therapist - feels this has been very helpful for his mental health - is moving into his own apartment at in December  -Current treatment: Aripiprazole 10mg -80mablet daily  Duloxetine 20mg - 64mpsule twice daily   Zolpidem 20mg - 116mlet daily  Trazodone 150mg - 1 64met daily   -Medications previously tried/failed: bupropion, chlorpromazine, fluoxetine, mirtazapine, depakote, clonazepam -Educated on Benefits of medication for symptom control Benefits of cognitive-behavioral therapy with or without medication -Patient states he is working with therapist to find a new psychiatrist, is hoping to start with a new provider within the next 1-2 months   GERD (Goal: Prevention of acid reflux) -Controlled -Current treatment  Tums 500mg - 1 t84mt with each meal  -Medications  previously tried: pantoprazole  -Counseled on diet and exercise extensively Recommended to continue current medication - reports that GERD is managed without pantoprazole at this time   Chronic Pain (Goal: Pain control) - Managed by [name] -Controlled -Current treatment  Methocarbamol 573m - 1 tablet every 8 hours as needed  - taking 3-4 times a week Gabapentin 8025m- 1 tablet 3 times daily  Hydrocodone-APAP 10-32541m 1 tablet 3 times daily  Butrans 60m24mr patch - 1 patch weekly (breaking out in hives  currently) Celecoxib 200mg4m capsule  twice daily  Volatren 1% gel - appled 4 times daily as needed  -Medications previously tried: tylenol #4, ibuprofen, indomethacin, opana, oxycodone, percocet, tramadol , cyclobenzaprine, lyrica, tizanidine,  -Recommended to continue current medication  BPH (Goal: Prevention of disease progression/ management of urinary symptoms) -Controlled -Current treatment  Tamsulosin 0.4mg -53mcapsule daily  Myrbetriq 50mg -98mablet daily  -Medications previously tried: myrbetriq, oxybutynin  -Recommended to continue current medication  Bowel Incontinence (Goal: Promotion of appropriate bowel movements) -Controlled -Current treatment  Zenpep 40000-126000 units -2 capsules with meals and 1 capsule with snacks -Medications previously tried: amitiza, bisacodyl, docusate, miralax  -Recommended to continue current medication   Memory Loss (Goal: Prevention of disease progression) -Controlled -Current treatment  Donepezil 5mg - 17mblet daily  -Medications previously tried: n/a  -Recommended to continue current medication  Low Testosterone Levels (Goal: Maintenance of appropriate testosterone levels) -Controlled -Last Testosterone level: 258.98 ng/dL (07/24/2019) -Current treatment  Testosterone 1.62% - 1 pump on the skin daily  -Medications previously tried: n/a  -Recommended to continue current medication  Allergic Rhinitis (Goal: Prevention and treatment of allergy symptoms) -Not ideally controlled -Current treatment  Azelastine-Fluticasone 137-50mcg/ac96m1 spray in both nostrils daily Symbicort 160-4.5mcg - 2 81mfs twice daily  Albuterol 108mcg/act 19mpuffs every 6 hours as needed  Cetirizine 10mg - 1 ta53m daily  -Medications previously tried: fluticasone, afrin, nasacort,  -Recommended for patient to start new medications prescribed with appointment today, explained the different between his two different inhalers and when to use each of the  inhalers, patient to monitor symptoms of shortness of breath/ allergies   Health Maintenance -Vaccine gaps: COVID booster and flu vaccine  -Current therapy:  Iron polysaccharides 300mg - 1 cap68m daily Vitamin D 1000 units-  1 tablet daily Last Vitamin D level 53.07 ng/mL (12/01/2020)  -Educated on Cost vs benefit of each product must be carefully weighed by individual consumer -Patient is satisfied with current therapy and denies issues -Recommended to continue current medication  Patient Goals/Self-Care Activities Patient will:  - take medications as prescribed  Follow Up Plan: Telephone follow up appointment with care management team member scheduled for: 4 months  The patient has been provided with contact information for the care management team and has been advised to call with any health related questions or concerns.         Medication Assistance: None required.  Patient affirms current coverage meets needs.   [facility] [address] [place] [phone] [phone]? No - has a daytime / night time bin of his medications, uses old pill bottles and fills up morning, afternoon, evening, and nighttime medications to take  Pt endorses 100% compliance  Care Plan and Follow Up Patient Decision:  Patient agrees to Care Plan and Follow-up.  Plan:  Telephone follow up appointment with care management team member scheduled for:  4 months and The patient has been provided with contact information for the care management team and has been advised to call with any health related questions or concerns.   Tomasa Blase, PharmD Clinical Pharmacist, [place]

## 2021-04-19 ENCOUNTER — Encounter (INDEPENDENT_AMBULATORY_CARE_PROVIDER_SITE_OTHER): Payer: Self-pay | Admitting: Family Medicine

## 2021-04-19 ENCOUNTER — Ambulatory Visit (INDEPENDENT_AMBULATORY_CARE_PROVIDER_SITE_OTHER): Payer: Medicare Other | Admitting: Family Medicine

## 2021-04-19 ENCOUNTER — Ambulatory Visit: Payer: Medicare Other | Admitting: Skilled Nursing Facility1

## 2021-04-19 VITALS — BP 127/73 | HR 77 | Temp 98.4°F | Ht 70.0 in | Wt 286.0 lb

## 2021-04-19 DIAGNOSIS — E785 Hyperlipidemia, unspecified: Secondary | ICD-10-CM

## 2021-04-19 DIAGNOSIS — E1159 Type 2 diabetes mellitus with other circulatory complications: Secondary | ICD-10-CM | POA: Diagnosis not present

## 2021-04-19 DIAGNOSIS — Z9884 Bariatric surgery status: Secondary | ICD-10-CM | POA: Diagnosis not present

## 2021-04-19 DIAGNOSIS — I152 Hypertension secondary to endocrine disorders: Secondary | ICD-10-CM | POA: Diagnosis not present

## 2021-04-19 DIAGNOSIS — Z6841 Body Mass Index (BMI) 40.0 and over, adult: Secondary | ICD-10-CM

## 2021-04-19 DIAGNOSIS — E1169 Type 2 diabetes mellitus with other specified complication: Secondary | ICD-10-CM | POA: Diagnosis not present

## 2021-04-19 DIAGNOSIS — E1165 Type 2 diabetes mellitus with hyperglycemia: Secondary | ICD-10-CM

## 2021-04-20 DIAGNOSIS — G4733 Obstructive sleep apnea (adult) (pediatric): Secondary | ICD-10-CM | POA: Diagnosis not present

## 2021-04-20 DIAGNOSIS — E785 Hyperlipidemia, unspecified: Secondary | ICD-10-CM

## 2021-04-20 DIAGNOSIS — E1151 Type 2 diabetes mellitus with diabetic peripheral angiopathy without gangrene: Secondary | ICD-10-CM | POA: Diagnosis not present

## 2021-04-20 DIAGNOSIS — I10 Essential (primary) hypertension: Secondary | ICD-10-CM | POA: Diagnosis not present

## 2021-04-20 DIAGNOSIS — Z96641 Presence of right artificial hip joint: Secondary | ICD-10-CM | POA: Diagnosis not present

## 2021-04-20 DIAGNOSIS — Z7982 Long term (current) use of aspirin: Secondary | ICD-10-CM | POA: Diagnosis not present

## 2021-04-20 DIAGNOSIS — K219 Gastro-esophageal reflux disease without esophagitis: Secondary | ICD-10-CM | POA: Diagnosis not present

## 2021-04-20 DIAGNOSIS — I509 Heart failure, unspecified: Secondary | ICD-10-CM | POA: Diagnosis not present

## 2021-04-20 DIAGNOSIS — I2510 Atherosclerotic heart disease of native coronary artery without angina pectoris: Secondary | ICD-10-CM | POA: Diagnosis not present

## 2021-04-20 DIAGNOSIS — Z87891 Personal history of nicotine dependence: Secondary | ICD-10-CM

## 2021-04-20 DIAGNOSIS — M4800 Spinal stenosis, site unspecified: Secondary | ICD-10-CM | POA: Diagnosis not present

## 2021-04-20 DIAGNOSIS — Z471 Aftercare following joint replacement surgery: Secondary | ICD-10-CM | POA: Diagnosis not present

## 2021-04-20 DIAGNOSIS — I252 Old myocardial infarction: Secondary | ICD-10-CM | POA: Diagnosis not present

## 2021-04-20 DIAGNOSIS — Z791 Long term (current) use of non-steroidal anti-inflammatories (NSAID): Secondary | ICD-10-CM | POA: Diagnosis not present

## 2021-04-20 DIAGNOSIS — G8929 Other chronic pain: Secondary | ICD-10-CM | POA: Diagnosis not present

## 2021-04-20 DIAGNOSIS — Z96612 Presence of left artificial shoulder joint: Secondary | ICD-10-CM | POA: Diagnosis not present

## 2021-04-20 DIAGNOSIS — Z89511 Acquired absence of right leg below knee: Secondary | ICD-10-CM | POA: Diagnosis not present

## 2021-04-20 DIAGNOSIS — N200 Calculus of kidney: Secondary | ICD-10-CM | POA: Diagnosis not present

## 2021-04-20 NOTE — Progress Notes (Signed)
Chief Complaint:   OBESITY Travis [name] is here to discuss his progress with his obesity treatment plan along with follow-up of his obesity related diagnoses. Travis [name] is on the Category 4 Plan and states he is following his eating plan approximately 75% of the time. Travis [name] states he is not currently exercising.  Today's visit was #: 2 Starting weight: 302 lbs Starting date: 04/06/2021 Today's weight: 286 lbs Today's date: 04/19/2021 Total lbs lost to date: 16 Total lbs lost since last in-office visit: 16  Interim History: Pt recognizes he can't eat all food on plan. He will eat meat at dinner and can't eat the vegetables. If he is home for lunch, he will eat what meal on wheels brings. Breakfast has been [place] sausage on [employer] bread or 3 eggs on bread. He is eating string cheese or tuna pouch on wrap. Pt denies cravings. No changes need to be made. He is hoping to move at the end of the week. Pt is not sure what he will do for the rest of the holiday season.  Subjective:   1. Type 2 diabetes mellitus with hyperglycemia, without long-term current use of insulin (Travis [name]) Discussed labs with patient today. Pt's A1c is 5.6 with insulin level of 12.2. He is not on medication.  2. History of bariatric surgery He still has some restriction and is able to get in most food.  3. Hypertension associated with diabetes (Travis [name]) BP very well controlled. Pt denies chest pain/chest pressure/headache. Pt is managed by cardiology.  4. Hyperlipidemia associated with type 2 diabetes mellitus (Travis [name]) He is on Lipitor 20 mg and denies transaminitis or myalgias.  Assessment/Plan:   1. Type 2 diabetes mellitus with hyperglycemia, without long-term current use of insulin (Travis [name]) Good blood sugar control is important to decrease the likelihood of diabetic complications such as nephropathy, neuropathy, limb loss, blindness, coronary artery disease, and death. Intensive lifestyle modification including diet,  exercise and weight loss are the first line of treatment for diabetes. Repeat labs in 3 months. No meds at this time, as pt is controlled.  2. History of bariatric surgery Follow up on hunger and food quantity intake at next appt.  3. Hypertension associated with diabetes (Travis [name]) Travis [name] is working on healthy weight loss and exercise to improve blood pressure control. We will watch for signs of hypotension as he continues his lifestyle modifications. Monitor BP at subsequent appt. No change in meds or dosages at this time.  4. Hyperlipidemia associated with type 2 diabetes mellitus (Travis [name]) Cardiovascular risk and specific lipid/LDL goals reviewed.  We discussed several lifestyle modifications today and Travis [name] will continue to work on diet, exercise and weight loss efforts. Orders and follow up as documented in patient record. Continue Lipitor with no change in dose.  Counseling Intensive lifestyle modifications are the first line treatment for this issue. Dietary changes: Increase soluble fiber. Decrease simple carbohydrates. Exercise changes: Moderate to vigorous-intensity aerobic activity 150 minutes per week if tolerated. Lipid-lowering medications: see documented in medical record.  5. Class 3 severe obesity with serious comorbidity and body mass index (BMI) of 40.0 to 44.9 in adult, unspecified obesity type Travis [name])  Travis [name] is currently in the action stage of change. As such, his goal is to continue with weight loss efforts. He has agreed to the Category 4 Plan.   Exercise goals: No exercise has been prescribed at this time.  Behavioral modification strategies: increasing lean protein intake, meal planning and cooking strategies, keeping healthy foods in the  home, and holiday eating strategies .  Travis [name] has agreed to follow-up with our clinic in 2 weeks. He was informed of the importance of frequent follow-up visits to maximize his success with intensive lifestyle modifications for his  multiple health conditions.   Objective:   Blood pressure 127/73, pulse 77, temperature 98.4 F (36.9 C), height 5' 10"  (1.778 m), weight 286 lb (129.7 kg), SpO2 96 %. Body mass index is 41.04 kg/m.  General: Cooperative, alert, well developed, in no acute distress. HEENT: Conjunctivae and lids unremarkable. Cardiovascular: Regular rhythm.  Lungs: Normal work of breathing. Neurologic: No focal deficits.   Lab Results  Component Value Date   CREATININE 0.98 04/11/2021   BUN 19 04/11/2021   NA 141 04/11/2021   K 4.0 04/11/2021   CL 102 04/11/2021   CO2 24 04/11/2021   Lab Results  Component Value Date   ALT 8 04/11/2021   AST 17 04/11/2021   ALKPHOS 82 04/11/2021   BILITOT 0.4 04/11/2021   Lab Results  Component Value Date   HGBA1C 5.6 04/11/2021   HGBA1C 5.8 (H) 03/09/2021   HGBA1C 6.1 12/01/2020   HGBA1C 5.7 (A) 02/24/2020   HGBA1C 5.9 02/24/2020   Lab Results  Component Value Date   INSULIN 12.2 04/11/2021   Lab Results  Component Value Date   TSH 1.960 04/11/2021   Lab Results  Component Value Date   CHOL 125 04/11/2021   HDL 48 04/11/2021   LDLCALC 59 04/11/2021   TRIG 96 04/11/2021   CHOLHDL 3 12/01/2020   Lab Results  Component Value Date   VD25OH 51.0 04/11/2021   VD25OH 53.07 12/01/2020   VD25OH 29.84 (L) 02/24/2020   Lab Results  Component Value Date   WBC 5.7 04/13/2021   HGB 11.4 (L) 04/13/2021   HCT 32.9 (L) 04/13/2021   MCV 90 04/13/2021   PLT 327 04/13/2021   Lab Results  Component Value Date   IRON 49 12/01/2020   TIBC 186 (L) 10/18/2014   FERRITIN 25.6 12/01/2020    Attestation Statements:   Reviewed by clinician on day of visit: allergies, medications, problem list, medical history, surgical history, family history, social history, and previous encounter notes.  Coral Ceo, CMA, am acting as transcriptionist for Coralie Common, MD.   I have reviewed the above documentation for accuracy and completeness, and I  agree with the above. - Coralie Common, MD

## 2021-04-26 DIAGNOSIS — R2681 Unsteadiness on feet: Secondary | ICD-10-CM | POA: Diagnosis not present

## 2021-04-26 DIAGNOSIS — Z89511 Acquired absence of right leg below knee: Secondary | ICD-10-CM | POA: Diagnosis not present

## 2021-04-26 DIAGNOSIS — G4733 Obstructive sleep apnea (adult) (pediatric): Secondary | ICD-10-CM | POA: Diagnosis not present

## 2021-04-26 DIAGNOSIS — Z791 Long term (current) use of non-steroidal anti-inflammatories (NSAID): Secondary | ICD-10-CM | POA: Diagnosis not present

## 2021-04-26 DIAGNOSIS — M4800 Spinal stenosis, site unspecified: Secondary | ICD-10-CM | POA: Diagnosis not present

## 2021-04-26 DIAGNOSIS — I252 Old myocardial infarction: Secondary | ICD-10-CM | POA: Diagnosis not present

## 2021-04-26 DIAGNOSIS — E785 Hyperlipidemia, unspecified: Secondary | ICD-10-CM | POA: Diagnosis not present

## 2021-04-26 DIAGNOSIS — Z96641 Presence of right artificial hip joint: Secondary | ICD-10-CM | POA: Diagnosis not present

## 2021-04-26 DIAGNOSIS — Z96612 Presence of left artificial shoulder joint: Secondary | ICD-10-CM | POA: Diagnosis not present

## 2021-04-26 DIAGNOSIS — R011 Cardiac murmur, unspecified: Secondary | ICD-10-CM | POA: Diagnosis not present

## 2021-04-26 DIAGNOSIS — G8929 Other chronic pain: Secondary | ICD-10-CM | POA: Diagnosis not present

## 2021-04-26 DIAGNOSIS — Z471 Aftercare following joint replacement surgery: Secondary | ICD-10-CM | POA: Diagnosis not present

## 2021-04-26 DIAGNOSIS — I2510 Atherosclerotic heart disease of native coronary artery without angina pectoris: Secondary | ICD-10-CM | POA: Diagnosis not present

## 2021-04-26 DIAGNOSIS — N200 Calculus of kidney: Secondary | ICD-10-CM | POA: Diagnosis not present

## 2021-04-26 DIAGNOSIS — Z7982 Long term (current) use of aspirin: Secondary | ICD-10-CM | POA: Diagnosis not present

## 2021-04-26 DIAGNOSIS — K219 Gastro-esophageal reflux disease without esophagitis: Secondary | ICD-10-CM | POA: Diagnosis not present

## 2021-04-26 DIAGNOSIS — E1151 Type 2 diabetes mellitus with diabetic peripheral angiopathy without gangrene: Secondary | ICD-10-CM | POA: Diagnosis not present

## 2021-04-26 DIAGNOSIS — I509 Heart failure, unspecified: Secondary | ICD-10-CM | POA: Diagnosis not present

## 2021-04-27 ENCOUNTER — Other Ambulatory Visit: Payer: Self-pay

## 2021-04-27 ENCOUNTER — Ambulatory Visit (INDEPENDENT_AMBULATORY_CARE_PROVIDER_SITE_OTHER): Payer: Medicare Other | Admitting: Surgical

## 2021-04-27 DIAGNOSIS — M4726 Other spondylosis with radiculopathy, lumbar region: Secondary | ICD-10-CM | POA: Diagnosis not present

## 2021-04-27 DIAGNOSIS — Z96612 Presence of left artificial shoulder joint: Secondary | ICD-10-CM

## 2021-04-27 DIAGNOSIS — M48061 Spinal stenosis, lumbar region without neurogenic claudication: Secondary | ICD-10-CM | POA: Diagnosis not present

## 2021-04-27 DIAGNOSIS — M4326 Fusion of spine, lumbar region: Secondary | ICD-10-CM | POA: Diagnosis not present

## 2021-04-28 DIAGNOSIS — Z471 Aftercare following joint replacement surgery: Secondary | ICD-10-CM | POA: Diagnosis not present

## 2021-04-28 DIAGNOSIS — Z96641 Presence of right artificial hip joint: Secondary | ICD-10-CM | POA: Diagnosis not present

## 2021-04-28 DIAGNOSIS — N200 Calculus of kidney: Secondary | ICD-10-CM | POA: Diagnosis not present

## 2021-04-28 DIAGNOSIS — G4733 Obstructive sleep apnea (adult) (pediatric): Secondary | ICD-10-CM | POA: Diagnosis not present

## 2021-04-28 DIAGNOSIS — Z96612 Presence of left artificial shoulder joint: Secondary | ICD-10-CM | POA: Diagnosis not present

## 2021-04-28 DIAGNOSIS — E1151 Type 2 diabetes mellitus with diabetic peripheral angiopathy without gangrene: Secondary | ICD-10-CM | POA: Diagnosis not present

## 2021-04-28 DIAGNOSIS — Z7982 Long term (current) use of aspirin: Secondary | ICD-10-CM | POA: Diagnosis not present

## 2021-04-28 DIAGNOSIS — E785 Hyperlipidemia, unspecified: Secondary | ICD-10-CM | POA: Diagnosis not present

## 2021-04-28 DIAGNOSIS — G8929 Other chronic pain: Secondary | ICD-10-CM | POA: Diagnosis not present

## 2021-04-28 DIAGNOSIS — Z791 Long term (current) use of non-steroidal anti-inflammatories (NSAID): Secondary | ICD-10-CM | POA: Diagnosis not present

## 2021-04-28 DIAGNOSIS — I2510 Atherosclerotic heart disease of native coronary artery without angina pectoris: Secondary | ICD-10-CM | POA: Diagnosis not present

## 2021-04-28 DIAGNOSIS — Z89511 Acquired absence of right leg below knee: Secondary | ICD-10-CM | POA: Diagnosis not present

## 2021-04-28 DIAGNOSIS — I252 Old myocardial infarction: Secondary | ICD-10-CM | POA: Diagnosis not present

## 2021-04-28 DIAGNOSIS — I509 Heart failure, unspecified: Secondary | ICD-10-CM | POA: Diagnosis not present

## 2021-04-28 DIAGNOSIS — K219 Gastro-esophageal reflux disease without esophagitis: Secondary | ICD-10-CM | POA: Diagnosis not present

## 2021-04-28 DIAGNOSIS — M4800 Spinal stenosis, site unspecified: Secondary | ICD-10-CM | POA: Diagnosis not present

## 2021-04-29 DIAGNOSIS — R8279 Other abnormal findings on microbiological examination of urine: Secondary | ICD-10-CM | POA: Diagnosis not present

## 2021-04-30 DIAGNOSIS — R0600 Dyspnea, unspecified: Secondary | ICD-10-CM | POA: Diagnosis not present

## 2021-04-30 DIAGNOSIS — J9620 Acute and chronic respiratory failure, unspecified whether with hypoxia or hypercapnia: Secondary | ICD-10-CM | POA: Diagnosis not present

## 2021-05-01 ENCOUNTER — Encounter: Payer: Self-pay | Admitting: Orthopedic Surgery
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
## 2021-05-02 ENCOUNTER — Telehealth: Payer: Self-pay | Admitting: Orthopedic Surgery

## 2021-05-02 ENCOUNTER — Other Ambulatory Visit: Payer: Self-pay | Admitting: Cardiovascular Disease

## 2021-05-02 DIAGNOSIS — Z7982 Long term (current) use of aspirin: Secondary | ICD-10-CM | POA: Diagnosis not present

## 2021-05-02 DIAGNOSIS — E1151 Type 2 diabetes mellitus with diabetic peripheral angiopathy without gangrene: Secondary | ICD-10-CM | POA: Diagnosis not present

## 2021-05-02 DIAGNOSIS — Z471 Aftercare following joint replacement surgery: Secondary | ICD-10-CM | POA: Diagnosis not present

## 2021-05-02 DIAGNOSIS — Z96612 Presence of left artificial shoulder joint: Secondary | ICD-10-CM | POA: Diagnosis not present

## 2021-05-02 DIAGNOSIS — E785 Hyperlipidemia, unspecified: Secondary | ICD-10-CM | POA: Diagnosis not present

## 2021-05-02 DIAGNOSIS — Z89511 Acquired absence of right leg below knee: Secondary | ICD-10-CM | POA: Diagnosis not present

## 2021-05-02 DIAGNOSIS — Z791 Long term (current) use of non-steroidal anti-inflammatories (NSAID): Secondary | ICD-10-CM | POA: Diagnosis not present

## 2021-05-02 DIAGNOSIS — M4800 Spinal stenosis, site unspecified: Secondary | ICD-10-CM | POA: Diagnosis not present

## 2021-05-02 DIAGNOSIS — G8929 Other chronic pain: Secondary | ICD-10-CM | POA: Diagnosis not present

## 2021-05-02 DIAGNOSIS — I509 Heart failure, unspecified: Secondary | ICD-10-CM | POA: Diagnosis not present

## 2021-05-02 DIAGNOSIS — Z96641 Presence of right artificial hip joint: Secondary | ICD-10-CM | POA: Diagnosis not present

## 2021-05-02 DIAGNOSIS — I252 Old myocardial infarction: Secondary | ICD-10-CM | POA: Diagnosis not present

## 2021-05-02 DIAGNOSIS — I2510 Atherosclerotic heart disease of native coronary artery without angina pectoris: Secondary | ICD-10-CM | POA: Diagnosis not present

## 2021-05-02 DIAGNOSIS — N200 Calculus of kidney: Secondary | ICD-10-CM | POA: Diagnosis not present

## 2021-05-02 DIAGNOSIS — G4733 Obstructive sleep apnea (adult) (pediatric): Secondary | ICD-10-CM | POA: Diagnosis not present

## 2021-05-02 DIAGNOSIS — K219 Gastro-esophageal reflux disease without esophagitis: Secondary | ICD-10-CM | POA: Diagnosis not present

## 2021-05-02 NOTE — Telephone Encounter (Signed)
Tom with [facility] called on behalf of the pt to report that the pt is having 9/10 lower back pain. He did want to inform DR.Marlou Sa he has a UTI and is waiting for the antibiotics for that to come in. Lastly Tom wanted Dr. Marlou Sa to know the pt is still taking his rx for his shoulder, and he would like Dr.Dean to call the pt back directly please.   [phone]

## 2021-05-04 ENCOUNTER — Other Ambulatory Visit: Payer: Self-pay | Admitting: Cardiovascular Disease

## 2021-05-05 ENCOUNTER — Other Ambulatory Visit: Payer: Self-pay

## 2021-05-05 ENCOUNTER — Ambulatory Visit (INDEPENDENT_AMBULATORY_CARE_PROVIDER_SITE_OTHER): Payer: Medicare Other | Admitting: Family Medicine

## 2021-05-05 ENCOUNTER — Encounter (INDEPENDENT_AMBULATORY_CARE_PROVIDER_SITE_OTHER): Payer: Self-pay | Admitting: Family Medicine

## 2021-05-05 VITALS — BP 122/81 | HR 79 | Temp 98.7°F | Ht 70.0 in | Wt 285.0 lb

## 2021-05-05 DIAGNOSIS — I2510 Atherosclerotic heart disease of native coronary artery without angina pectoris: Secondary | ICD-10-CM | POA: Diagnosis not present

## 2021-05-05 DIAGNOSIS — I252 Old myocardial infarction: Secondary | ICD-10-CM | POA: Diagnosis not present

## 2021-05-05 DIAGNOSIS — E1151 Type 2 diabetes mellitus with diabetic peripheral angiopathy without gangrene: Secondary | ICD-10-CM | POA: Diagnosis not present

## 2021-05-05 DIAGNOSIS — G4733 Obstructive sleep apnea (adult) (pediatric): Secondary | ICD-10-CM | POA: Diagnosis not present

## 2021-05-05 DIAGNOSIS — Z9884 Bariatric surgery status: Secondary | ICD-10-CM

## 2021-05-05 DIAGNOSIS — E785 Hyperlipidemia, unspecified: Secondary | ICD-10-CM | POA: Diagnosis not present

## 2021-05-05 DIAGNOSIS — M4800 Spinal stenosis, site unspecified: Secondary | ICD-10-CM | POA: Diagnosis not present

## 2021-05-05 DIAGNOSIS — Z89511 Acquired absence of right leg below knee: Secondary | ICD-10-CM | POA: Diagnosis not present

## 2021-05-05 DIAGNOSIS — Z7982 Long term (current) use of aspirin: Secondary | ICD-10-CM | POA: Diagnosis not present

## 2021-05-05 DIAGNOSIS — Z471 Aftercare following joint replacement surgery: Secondary | ICD-10-CM | POA: Diagnosis not present

## 2021-05-05 DIAGNOSIS — I509 Heart failure, unspecified: Secondary | ICD-10-CM | POA: Diagnosis not present

## 2021-05-05 DIAGNOSIS — Z96641 Presence of right artificial hip joint: Secondary | ICD-10-CM | POA: Diagnosis not present

## 2021-05-05 DIAGNOSIS — E1169 Type 2 diabetes mellitus with other specified complication: Secondary | ICD-10-CM | POA: Diagnosis not present

## 2021-05-05 DIAGNOSIS — K219 Gastro-esophageal reflux disease without esophagitis: Secondary | ICD-10-CM | POA: Diagnosis not present

## 2021-05-05 DIAGNOSIS — G8929 Other chronic pain: Secondary | ICD-10-CM | POA: Diagnosis not present

## 2021-05-05 DIAGNOSIS — Z791 Long term (current) use of non-steroidal anti-inflammatories (NSAID): Secondary | ICD-10-CM | POA: Diagnosis not present

## 2021-05-05 DIAGNOSIS — Z96612 Presence of left artificial shoulder joint: Secondary | ICD-10-CM | POA: Diagnosis not present

## 2021-05-05 DIAGNOSIS — N200 Calculus of kidney: Secondary | ICD-10-CM | POA: Diagnosis not present

## 2021-05-05 DIAGNOSIS — Z6841 Body Mass Index (BMI) 40.0 and over, adult: Secondary | ICD-10-CM

## 2021-05-09 NOTE — Progress Notes (Signed)
Chief Complaint:   OBESITY Travis [name] is here to discuss his progress with his obesity treatment plan along with follow-up of his obesity related diagnoses. Travis [name] is on the Category 4 Plan and states he is following his eating plan approximately 85% of the time. Travis [name] states he is dancing 10 minutes 2 times per week.  Today's visit was #: 3 Starting weight: 302 lbs Starting date: 04/06/2021 Today's weight: 285 lbs Today's date: 05/05/2021 Total lbs lost to date: 17 Total lbs lost since last in-office visit: 1  Interim History: Pt voices concern about some alcoholic beverage indulgences on Friday night. He has experienced an increase in pain in his back recently and has been icing his back and changed his pain patch. Pt has been eating seafood, roast beef, or corned beef for lunch. Supper has been chicken or seafood.  Subjective:   1. Hyperlipidemia associated with type 2 diabetes mellitus (HCC) Pt has an LDL of 59, HDL 48, and triglycerides 96 and is on Lipitor.  2. History of bariatric surgery Dontavion often can eat most food in each meal. He focuses on eating protein first.  Assessment/Plan:   1. Hyperlipidemia associated with type 2 diabetes mellitus (Travis [name]) At goal. Cardiovascular risk and specific lipid/LDL goals reviewed.  We discussed several lifestyle modifications today and Travis [name] will continue to work on diet, exercise and weight loss efforts. Orders and follow up as documented in patient record. Recheck labs in 6 months.  Counseling Intensive lifestyle modifications are the first line treatment for this issue. Dietary changes: Increase soluble fiber. Decrease simple carbohydrates. Exercise changes: Moderate to vigorous-intensity aerobic activity 150 minutes per week if tolerated. Lipid-lowering medications: see documented in medical record.  2. History of bariatric surgery Pt encouraged to continue breaking up meals to smaller portions, more frequent meals to get  all food in.  3. Obesity with current BMI of 40.89  Travis [name] is currently in the action stage of change. As such, his goal is to continue with weight loss efforts. He has agreed to the Category 4 Plan.   Exercise goals: All adults should avoid inactivity. Some physical activity is better than none, and adults who participate in any amount of physical activity gain some health benefits.  Behavioral modification strategies: increasing lean protein intake, meal planning and cooking strategies, keeping healthy foods in the home, and planning for success.  Travis [name] has agreed to follow-up with our clinic in 3-4 weeks. He was informed of the importance of frequent follow-up visits to maximize his success with intensive lifestyle modifications for his multiple health conditions.   Objective:   Blood pressure 122/81, pulse 79, temperature 98.7 F (37.1 C), height 5' 10"  (1.778 m), weight 285 lb (129.3 kg), SpO2 94 %. Body mass index is 40.89 kg/m.  General: Cooperative, alert, well developed, in no acute distress. HEENT: Conjunctivae and lids unremarkable. Cardiovascular: Regular rhythm.  Lungs: Normal work of breathing. Neurologic: No focal deficits.   Lab Results  Component Value Date   CREATININE 0.98 04/11/2021   BUN 19 04/11/2021   NA 141 04/11/2021   K 4.0 04/11/2021   CL 102 04/11/2021   CO2 24 04/11/2021   Lab Results  Component Value Date   ALT 8 04/11/2021   AST 17 04/11/2021   ALKPHOS 82 04/11/2021   BILITOT 0.4 04/11/2021   Lab Results  Component Value Date   HGBA1C 5.6 04/11/2021   HGBA1C 5.8 (H) 03/09/2021   HGBA1C 6.1 12/01/2020   HGBA1C 5.7 (A)  02/24/2020   HGBA1C 5.9 02/24/2020   Lab Results  Component Value Date   INSULIN 12.2 04/11/2021   Lab Results  Component Value Date   TSH 1.960 04/11/2021   Lab Results  Component Value Date   CHOL 125 04/11/2021   HDL 48 04/11/2021   LDLCALC 59 04/11/2021   TRIG 96 04/11/2021   CHOLHDL 3 12/01/2020   Lab  Results  Component Value Date   VD25OH 51.0 04/11/2021   VD25OH 53.07 12/01/2020   VD25OH 29.84 (L) 02/24/2020   Lab Results  Component Value Date   WBC 5.7 04/13/2021   HGB 11.4 (L) 04/13/2021   HCT 32.9 (L) 04/13/2021   MCV 90 04/13/2021   PLT 327 04/13/2021   Lab Results  Component Value Date   IRON 49 12/01/2020   TIBC 186 (L) 10/18/2014   FERRITIN 25.6 12/01/2020    Attestation Statements:   Reviewed by clinician on day of visit: allergies, medications, problem list, medical history, surgical history, family history, social history, and previous encounter notes.  Coral Ceo, CMA, am acting as transcriptionist for Coralie Common, MD. I have reviewed the above documentation for accuracy and completeness, and I agree with the above. - Coralie Common, MD

## 2021-05-11 ENCOUNTER — Telehealth: Payer: Self-pay | Admitting: Family Medicine

## 2021-05-11 DIAGNOSIS — M4800 Spinal stenosis, site unspecified: Secondary | ICD-10-CM | POA: Diagnosis not present

## 2021-05-11 DIAGNOSIS — K219 Gastro-esophageal reflux disease without esophagitis: Secondary | ICD-10-CM | POA: Diagnosis not present

## 2021-05-11 DIAGNOSIS — Z96641 Presence of right artificial hip joint: Secondary | ICD-10-CM | POA: Diagnosis not present

## 2021-05-11 DIAGNOSIS — Z89511 Acquired absence of right leg below knee: Secondary | ICD-10-CM | POA: Diagnosis not present

## 2021-05-11 DIAGNOSIS — I2510 Atherosclerotic heart disease of native coronary artery without angina pectoris: Secondary | ICD-10-CM | POA: Diagnosis not present

## 2021-05-11 DIAGNOSIS — I509 Heart failure, unspecified: Secondary | ICD-10-CM | POA: Diagnosis not present

## 2021-05-11 DIAGNOSIS — Z7982 Long term (current) use of aspirin: Secondary | ICD-10-CM | POA: Diagnosis not present

## 2021-05-11 DIAGNOSIS — I252 Old myocardial infarction: Secondary | ICD-10-CM | POA: Diagnosis not present

## 2021-05-11 DIAGNOSIS — Z96612 Presence of left artificial shoulder joint: Secondary | ICD-10-CM | POA: Diagnosis not present

## 2021-05-11 DIAGNOSIS — G8929 Other chronic pain: Secondary | ICD-10-CM | POA: Diagnosis not present

## 2021-05-11 DIAGNOSIS — G4733 Obstructive sleep apnea (adult) (pediatric): Secondary | ICD-10-CM | POA: Diagnosis not present

## 2021-05-11 DIAGNOSIS — Z471 Aftercare following joint replacement surgery: Secondary | ICD-10-CM | POA: Diagnosis not present

## 2021-05-11 DIAGNOSIS — E1151 Type 2 diabetes mellitus with diabetic peripheral angiopathy without gangrene: Secondary | ICD-10-CM | POA: Diagnosis not present

## 2021-05-11 DIAGNOSIS — N200 Calculus of kidney: Secondary | ICD-10-CM | POA: Diagnosis not present

## 2021-05-11 DIAGNOSIS — Z791 Long term (current) use of non-steroidal anti-inflammatories (NSAID): Secondary | ICD-10-CM | POA: Diagnosis not present

## 2021-05-11 DIAGNOSIS — E785 Hyperlipidemia, unspecified: Secondary | ICD-10-CM | POA: Diagnosis not present

## 2021-05-11 NOTE — Telephone Encounter (Signed)
Pt states that [facility] has informed him that he is needing his provider to send in a Rx for him to be able to get a new head piece for his cpap machine. Please advise.

## 2021-05-11 NOTE — Telephone Encounter (Signed)
Pt has not been seen since 2020. Will need f/u first before we can write orders. Please call pt back.

## 2021-05-12 ENCOUNTER — Ambulatory Visit: Payer: Medicare Other | Admitting: Family Medicine

## 2021-05-12 DIAGNOSIS — R3915 Urgency of urination: Secondary | ICD-10-CM | POA: Diagnosis not present

## 2021-05-19 ENCOUNTER — Telehealth (INDEPENDENT_AMBULATORY_CARE_PROVIDER_SITE_OTHER): Payer: Medicare Other | Admitting: Family Medicine

## 2021-05-19 ENCOUNTER — Encounter: Payer: Self-pay | Admitting: Family Medicine

## 2021-05-19 DIAGNOSIS — U071 COVID-19: Secondary | ICD-10-CM | POA: Diagnosis not present
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
## 2021-05-19 NOTE — Patient Instructions (Addendum)
°  HOME CARE TIPS:  -can use tylenol or aleve if needed for fevers, aches and pains per instructions  -can use nasal saline a few times per day if you have nasal congestion  -stay hydrated, drink plenty of fluids and eat small healthy meals - avoid dairy products  -can use imodium for the diarrhea  -steam and/or a humidifier can be helpful for the sinuses  -stay home while sick, except to seek medical care. If you have COVID19, you will likely be contagious for 7-10 days. Flu or Influenza is likely contagious for about 7 days. Other respiratory viral infections remain contagious for 5-10+ days depending on the virus and many other factors. Wear a good mask that fits snugly (such as N95 or KN95) if around others to reduce the risk of transmission.  It was nice to meet you today, and I really hope you are feeling better soon. I help [facility] out with telemedicine visits on Tuesdays and Thursdays and am happy to help if you need a follow up virtual visit on those days. Otherwise, if you have any concerns or questions following this visit please schedule a follow up visit with your Primary Care doctor or seek care at a local urgent care clinic to avoid delays in care.    Seek in person care or schedule a follow up video visit promptly if your symptoms worsen, new concerns arise or you are not improving with treatment. Call 911 and/or seek emergency care if your symptoms are severe or life threatening.

## 2021-05-20 ENCOUNTER — Telehealth: Payer: Medicaid Other | Admitting: Nurse Practitioner

## 2021-05-25 ENCOUNTER — Other Ambulatory Visit: Payer: Self-pay | Admitting: Internal Medicine

## 2021-05-27 DIAGNOSIS — Z96641 Presence of right artificial hip joint: Secondary | ICD-10-CM | POA: Diagnosis not present

## 2021-05-27 DIAGNOSIS — G8929 Other chronic pain: Secondary | ICD-10-CM | POA: Diagnosis not present

## 2021-05-27 DIAGNOSIS — Z96612 Presence of left artificial shoulder joint: Secondary | ICD-10-CM | POA: Diagnosis not present

## 2021-05-27 DIAGNOSIS — N200 Calculus of kidney: Secondary | ICD-10-CM | POA: Diagnosis not present

## 2021-05-27 DIAGNOSIS — E785 Hyperlipidemia, unspecified: Secondary | ICD-10-CM | POA: Diagnosis not present

## 2021-05-27 DIAGNOSIS — I252 Old myocardial infarction: Secondary | ICD-10-CM | POA: Diagnosis not present

## 2021-05-27 DIAGNOSIS — I2510 Atherosclerotic heart disease of native coronary artery without angina pectoris: Secondary | ICD-10-CM | POA: Diagnosis not present

## 2021-05-27 DIAGNOSIS — Z7982 Long term (current) use of aspirin: Secondary | ICD-10-CM | POA: Diagnosis not present

## 2021-05-27 DIAGNOSIS — Z471 Aftercare following joint replacement surgery: Secondary | ICD-10-CM | POA: Diagnosis not present

## 2021-05-27 DIAGNOSIS — E1151 Type 2 diabetes mellitus with diabetic peripheral angiopathy without gangrene: Secondary | ICD-10-CM | POA: Diagnosis not present

## 2021-05-27 DIAGNOSIS — Z791 Long term (current) use of non-steroidal anti-inflammatories (NSAID): Secondary | ICD-10-CM | POA: Diagnosis not present

## 2021-05-27 DIAGNOSIS — M4800 Spinal stenosis, site unspecified: Secondary | ICD-10-CM | POA: Diagnosis not present

## 2021-05-27 DIAGNOSIS — Z89511 Acquired absence of right leg below knee: Secondary | ICD-10-CM | POA: Diagnosis not present

## 2021-05-27 DIAGNOSIS — G4733 Obstructive sleep apnea (adult) (pediatric): Secondary | ICD-10-CM | POA: Diagnosis not present

## 2021-05-27 DIAGNOSIS — K219 Gastro-esophageal reflux disease without esophagitis: Secondary | ICD-10-CM | POA: Diagnosis not present

## 2021-05-27 DIAGNOSIS — I509 Heart failure, unspecified: Secondary | ICD-10-CM | POA: Diagnosis not present

## 2021-05-28 DIAGNOSIS — M4326 Fusion of spine, lumbar region: Secondary | ICD-10-CM | POA: Diagnosis not present

## 2021-05-28 DIAGNOSIS — M48061 Spinal stenosis, lumbar region without neurogenic claudication: Secondary | ICD-10-CM | POA: Diagnosis not present

## 2021-05-28 DIAGNOSIS — M4726 Other spondylosis with radiculopathy, lumbar region: Secondary | ICD-10-CM | POA: Diagnosis not present

## 2021-05-30 DIAGNOSIS — G4733 Obstructive sleep apnea (adult) (pediatric): Secondary | ICD-10-CM | POA: Diagnosis not present

## 2021-05-31 DIAGNOSIS — I252 Old myocardial infarction: Secondary | ICD-10-CM | POA: Diagnosis not present

## 2021-05-31 DIAGNOSIS — Z96612 Presence of left artificial shoulder joint: Secondary | ICD-10-CM | POA: Diagnosis not present

## 2021-05-31 DIAGNOSIS — Z89511 Acquired absence of right leg below knee: Secondary | ICD-10-CM | POA: Diagnosis not present

## 2021-05-31 DIAGNOSIS — Z471 Aftercare following joint replacement surgery: Secondary | ICD-10-CM | POA: Diagnosis not present

## 2021-05-31 DIAGNOSIS — Z791 Long term (current) use of non-steroidal anti-inflammatories (NSAID): Secondary | ICD-10-CM | POA: Diagnosis not present

## 2021-05-31 DIAGNOSIS — E1151 Type 2 diabetes mellitus with diabetic peripheral angiopathy without gangrene: Secondary | ICD-10-CM | POA: Diagnosis not present

## 2021-05-31 DIAGNOSIS — R0600 Dyspnea, unspecified: Secondary | ICD-10-CM | POA: Diagnosis not present

## 2021-05-31 DIAGNOSIS — I2510 Atherosclerotic heart disease of native coronary artery without angina pectoris: Secondary | ICD-10-CM | POA: Diagnosis not present

## 2021-05-31 DIAGNOSIS — N200 Calculus of kidney: Secondary | ICD-10-CM | POA: Diagnosis not present

## 2021-05-31 DIAGNOSIS — J9620 Acute and chronic respiratory failure, unspecified whether with hypoxia or hypercapnia: Secondary | ICD-10-CM | POA: Diagnosis not present

## 2021-05-31 DIAGNOSIS — E785 Hyperlipidemia, unspecified: Secondary | ICD-10-CM | POA: Diagnosis not present

## 2021-05-31 DIAGNOSIS — Z7982 Long term (current) use of aspirin: Secondary | ICD-10-CM | POA: Diagnosis not present

## 2021-05-31 DIAGNOSIS — G4733 Obstructive sleep apnea (adult) (pediatric): Secondary | ICD-10-CM | POA: Diagnosis not present

## 2021-05-31 DIAGNOSIS — M4800 Spinal stenosis, site unspecified: Secondary | ICD-10-CM | POA: Diagnosis not present

## 2021-05-31 DIAGNOSIS — K219 Gastro-esophageal reflux disease without esophagitis: Secondary | ICD-10-CM | POA: Diagnosis not present

## 2021-05-31 DIAGNOSIS — G8929 Other chronic pain: Secondary | ICD-10-CM | POA: Diagnosis not present

## 2021-05-31 DIAGNOSIS — Z96641 Presence of right artificial hip joint: Secondary | ICD-10-CM | POA: Diagnosis not present

## 2021-05-31 DIAGNOSIS — I509 Heart failure, unspecified: Secondary | ICD-10-CM | POA: Diagnosis not present

## 2021-06-01 ENCOUNTER — Encounter (INDEPENDENT_AMBULATORY_CARE_PROVIDER_SITE_OTHER): Payer: Self-pay | Admitting: Family Medicine

## 2021-06-01 ENCOUNTER — Telehealth (INDEPENDENT_AMBULATORY_CARE_PROVIDER_SITE_OTHER): Payer: Medicare Other | Admitting: Family Medicine

## 2021-06-01 ENCOUNTER — Telehealth: Payer: Self-pay | Admitting: Gastroenterology

## 2021-06-01 DIAGNOSIS — E669 Obesity, unspecified: Secondary | ICD-10-CM

## 2021-06-01 DIAGNOSIS — I152 Hypertension secondary to endocrine disorders: Secondary | ICD-10-CM

## 2021-06-01 DIAGNOSIS — E1159 Type 2 diabetes mellitus with other circulatory complications: Secondary | ICD-10-CM | POA: Diagnosis not present

## 2021-06-01 DIAGNOSIS — E1165 Type 2 diabetes mellitus with hyperglycemia: Secondary | ICD-10-CM

## 2021-06-01 DIAGNOSIS — Z6841 Body Mass Index (BMI) 40.0 and over, adult: Secondary | ICD-10-CM

## 2021-06-01 MED ORDER — ZENPEP 40000-126000 UNITS PO CPEP: ORAL_CAPSULE | ORAL | 0 refills | Status: DC

## 2021-06-01 NOTE — Telephone Encounter (Signed)
Patient called to schedule follow up requesting a refill on Zenpep

## 2021-06-01 NOTE — Telephone Encounter (Signed)
Patient last seen 01-2020. He has an upcoming appointment on 1-26 with P.Guenther. Thirty day supply of ZenPep sent to pharmacy.

## 2021-06-03 ENCOUNTER — Telehealth (INDEPENDENT_AMBULATORY_CARE_PROVIDER_SITE_OTHER): Payer: Commercial Managed Care - HMO | Admitting: Internal Medicine

## 2021-06-03 ENCOUNTER — Other Ambulatory Visit: Payer: Self-pay

## 2021-06-03 DIAGNOSIS — Z0001 Encounter for general adult medical examination with abnormal findings: Secondary | ICD-10-CM

## 2021-06-03 DIAGNOSIS — Z202 Contact with and (suspected) exposure to infections with a predominantly sexual mode of transmission: Secondary | ICD-10-CM | POA: Diagnosis not present

## 2021-06-03 DIAGNOSIS — E538 Deficiency of other specified B group vitamins: Secondary | ICD-10-CM | POA: Diagnosis not present

## 2021-06-03 DIAGNOSIS — E1169 Type 2 diabetes mellitus with other specified complication: Secondary | ICD-10-CM

## 2021-06-03 DIAGNOSIS — E559 Vitamin D deficiency, unspecified: Secondary | ICD-10-CM

## 2021-06-03 DIAGNOSIS — U071 COVID-19: Secondary | ICD-10-CM | POA: Insufficient documentation

## 2021-06-03 DIAGNOSIS — N320 Bladder-neck obstruction: Secondary | ICD-10-CM | POA: Diagnosis not present

## 2021-06-03 DIAGNOSIS — E669 Obesity, unspecified: Secondary | ICD-10-CM

## 2021-06-03 DIAGNOSIS — F320 Major depressive disorder, single episode, mild: Secondary | ICD-10-CM

## 2021-06-03 MED ORDER — BENZONATATE 100 MG PO CAPS: 100.0000 mg | ORAL_CAPSULE | Freq: Three times a day (TID) | ORAL | 0 refills | Status: DC | PRN

## 2021-06-03 MED ORDER — ONDANSETRON HCL 4 MG PO TABS: 4.0000 mg | ORAL_TABLET | Freq: Three times a day (TID) | ORAL | 0 refills | Status: DC | PRN

## 2021-06-03 MED ORDER — NIRMATRELVIR/RITONAVIR (PAXLOVID)TABLET: 3.0000 | ORAL_TABLET | Freq: Two times a day (BID) | ORAL | 0 refills | Status: AC

## 2021-06-03 NOTE — Patient Instructions (Addendum)
Please take all new medication as prescribed  Please continue all other medications as before, and refills have been done if requested.  Please have the pharmacy call with any other refills you may need.  Please continue your efforts at being more active, low cholesterol diet, and weight control.  You are otherwise up to date with prevention measures today.  Please keep your appointments with your specialists as you may have planned  Please go to the LAB at the blood drawing area for the tests to be done  You will be contacted by phone if any changes need to be made immediately.  Otherwise, you will receive a letter about your results with an explanation, but please check with MyChart first.  Please remember to sign up for MyChart if you have not done so, as this will be important to you in the future with finding out test results, communicating by private email, and scheduling acute appointments online when needed.  Please make an Appointment to return in 6 months, or sooner if needed
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
## 2021-06-04 ENCOUNTER — Encounter: Payer: Self-pay | Admitting: Internal Medicine

## 2021-06-04 NOTE — Assessment & Plan Note (Signed)
Stable overall,  to f/u any worsening symptoms or concerns, cont same tx - cymbalta

## 2021-06-04 NOTE — Assessment & Plan Note (Signed)
Lab Results  Component Value Date   HGBA1C 5.6 04/11/2021   Stable, pt to continue current medical treatment  - diet

## 2021-06-04 NOTE — Assessment & Plan Note (Signed)
Age and sex appropriate education and counseling updated with regular exercise and diet Referrals for preventative services - none needed Immunizations addressed - declines covid booster and flu shot, shingrix Smoking counseling  - none needed Evidence for depression or other mood disorder - none significant Most recent labs reviewed. I have personally reviewed and have noted: 1) the patient's medical and social history 2) The patient's current medications and supplements 3) The patient's height, weight, and BMI have been recorded in the chart

## 2021-06-04 NOTE — Assessment & Plan Note (Signed)
New onset, for paxlovid course, zofran prn, tessalon perle prn,  to f/u any worsening symptoms or concerns

## 2021-06-04 NOTE — Assessment & Plan Note (Signed)
Pt request STD testing,  to f/u any worsening symptoms or concerns

## 2021-06-04 NOTE — Assessment & Plan Note (Signed)
Last vitamin D Lab Results  Component Value Date   VD25OH 51.0 04/11/2021   Stable, cont oral replacement

## 2021-06-06 NOTE — Progress Notes (Signed)
TeleHealth Visit:  Due to the COVID-19 pandemic, this visit was completed with telemedicine (audio/video) technology to reduce patient and provider exposure as well as to preserve personal protective equipment.   Faith has verbally consented to this TeleHealth visit. The patient is located at home, the provider is located at the [facility] office. The participants in this visit include the listed provider and patient. The visit was conducted today via video.  Chief Complaint: OBESITY Travis [name] is here to discuss his progress with his obesity treatment plan along with follow-up of his obesity related diagnoses. Travis [name] is on the Category 4 Plan and states he is following his eating plan approximately 60% of the time. Travis [name] states he is dancing and walking 120 minutes ? times per week.  Today's visit was #: 4 Starting weight: 302 lbs Starting date: 04/06/2021  Interim History: Pt had COVID currently- has congestion, fatigue, and cough- started symptoms yesterday. She had a good holiday season- stayed ~60% on plan but did indulge over Christmas, Travis [name] and his birthday. He went to see live music on his birthday. Pt reports losing ~4 lbs. He is supposed to see doctor on Friday (in 2 days). He is feeling queasy when he starts eating (and even with putting top dentures in). Pt has been eating more soup with recent COVID diagnosis.  Subjective:   1. Type 2 diabetes mellitus with hyperglycemia, without long-term current use of insulin (HCC) Pt is doing well following category 4. He I not on meds. He reports an increase in indulgent eating over the holidays.  2. Hypertension associated with diabetes ([place]) BP reported 132/68. Pt denies chest pain/chest pressure/headache.  Assessment/Plan:   1. Type 2 diabetes mellitus with hyperglycemia, without long-term current use of insulin (HCC) Good blood sugar control is important to decrease the likelihood of diabetic complications  such as nephropathy, neuropathy, limb loss, blindness, coronary artery disease, and death. Intensive lifestyle modification including diet, exercise and weight loss are the first line of treatment for diabetes. Continue category 4 with being mindful of carb intake.  2. Hypertension associated with diabetes (Travis [name]) At goal. Travis [name] is working on healthy weight loss and exercise to improve blood pressure control. We will watch for signs of hypotension as he continues his lifestyle modifications. Continue current treatment plan.  3. Obesity with current BMI of 40.89  Travis [name] is currently in the action stage of change. As such, his goal is to continue with weight loss efforts. He has agreed to the Category 4 Plan.   Exercise goals: No exercise has been prescribed at this time.  Behavioral modification strategies: increasing lean protein intake, meal planning and cooking strategies, keeping healthy foods in the home, and planning for success.  Travis [name] has agreed to follow-up with our clinic in 2 weeks. He was informed of the importance of frequent follow-up visits to maximize his success with intensive lifestyle modifications for his multiple health conditions.  Objective:   VITALS: Per patient if applicable, see vitals. GENERAL: Alert and in no acute distress. CARDIOPULMONARY: No increased WOB. Speaking in clear sentences.  PSYCH: Pleasant and cooperative. Speech normal rate and rhythm. Affect is appropriate. Insight and judgement are appropriate. Attention is focused, linear, and appropriate.  NEURO: Oriented as arrived to appointment on time with no prompting.   Lab Results  Component Value Date   CREATININE 0.98 04/11/2021   BUN 19 04/11/2021   NA 141 04/11/2021   K 4.0 04/11/2021   CL 102 04/11/2021  CO2 24 04/11/2021   Lab Results  Component Value Date   ALT 8 04/11/2021   AST 17 04/11/2021   ALKPHOS 82 04/11/2021   BILITOT 0.4 04/11/2021   Lab Results  Component Value Date    HGBA1C 5.6 04/11/2021   HGBA1C 5.8 (H) 03/09/2021   HGBA1C 6.1 12/01/2020   HGBA1C 5.7 (A) 02/24/2020   HGBA1C 5.9 02/24/2020   Lab Results  Component Value Date   INSULIN 12.2 04/11/2021   Lab Results  Component Value Date   TSH 1.960 04/11/2021   Lab Results  Component Value Date   CHOL 125 04/11/2021   HDL 48 04/11/2021   LDLCALC 59 04/11/2021   TRIG 96 04/11/2021   CHOLHDL 3 12/01/2020   Lab Results  Component Value Date   VD25OH 51.0 04/11/2021   VD25OH 53.07 12/01/2020   VD25OH 29.84 (L) 02/24/2020   Lab Results  Component Value Date   WBC 5.7 04/13/2021   HGB 11.4 (L) 04/13/2021   HCT 32.9 (L) 04/13/2021   MCV 90 04/13/2021   PLT 327 04/13/2021   Lab Results  Component Value Date   IRON 49 12/01/2020   TIBC 186 (L) 10/18/2014   FERRITIN 25.6 12/01/2020    Attestation Statements:   Reviewed by clinician on day of visit: allergies, medications, problem list, medical history, surgical history, family history, social history, and previous encounter notes.  Coral Ceo, CMA, am acting as transcriptionist for Coralie Common, MD.   I have reviewed the above documentation for accuracy and completeness, and I agree with the above. - Coralie Common, MD

## 2021-06-08 ENCOUNTER — Ambulatory Visit: Payer: Medicare Other | Admitting: Orthopedic Surgery

## 2021-06-09 ENCOUNTER — Other Ambulatory Visit: Payer: Self-pay | Admitting: Internal Medicine

## 2021-06-10 DIAGNOSIS — Z791 Long term (current) use of non-steroidal anti-inflammatories (NSAID): Secondary | ICD-10-CM | POA: Diagnosis not present

## 2021-06-10 DIAGNOSIS — E785 Hyperlipidemia, unspecified: Secondary | ICD-10-CM | POA: Diagnosis not present

## 2021-06-10 DIAGNOSIS — E1151 Type 2 diabetes mellitus with diabetic peripheral angiopathy without gangrene: Secondary | ICD-10-CM | POA: Diagnosis not present

## 2021-06-10 DIAGNOSIS — Z471 Aftercare following joint replacement surgery: Secondary | ICD-10-CM | POA: Diagnosis not present

## 2021-06-10 DIAGNOSIS — I509 Heart failure, unspecified: Secondary | ICD-10-CM | POA: Diagnosis not present

## 2021-06-10 DIAGNOSIS — I252 Old myocardial infarction: Secondary | ICD-10-CM | POA: Diagnosis not present

## 2021-06-10 DIAGNOSIS — G8929 Other chronic pain: Secondary | ICD-10-CM | POA: Diagnosis not present

## 2021-06-10 DIAGNOSIS — M4800 Spinal stenosis, site unspecified: Secondary | ICD-10-CM | POA: Diagnosis not present

## 2021-06-10 DIAGNOSIS — I2510 Atherosclerotic heart disease of native coronary artery without angina pectoris: Secondary | ICD-10-CM | POA: Diagnosis not present

## 2021-06-10 DIAGNOSIS — K219 Gastro-esophageal reflux disease without esophagitis: Secondary | ICD-10-CM | POA: Diagnosis not present

## 2021-06-10 DIAGNOSIS — Z96641 Presence of right artificial hip joint: Secondary | ICD-10-CM | POA: Diagnosis not present

## 2021-06-10 DIAGNOSIS — Z89511 Acquired absence of right leg below knee: Secondary | ICD-10-CM | POA: Diagnosis not present

## 2021-06-10 DIAGNOSIS — G4733 Obstructive sleep apnea (adult) (pediatric): Secondary | ICD-10-CM | POA: Diagnosis not present

## 2021-06-10 DIAGNOSIS — N200 Calculus of kidney: Secondary | ICD-10-CM | POA: Diagnosis not present

## 2021-06-10 DIAGNOSIS — Z7982 Long term (current) use of aspirin: Secondary | ICD-10-CM | POA: Diagnosis not present

## 2021-06-10 DIAGNOSIS — Z96612 Presence of left artificial shoulder joint: Secondary | ICD-10-CM | POA: Diagnosis not present

## 2021-06-13 DIAGNOSIS — N200 Calculus of kidney: Secondary | ICD-10-CM | POA: Diagnosis not present

## 2021-06-13 DIAGNOSIS — I252 Old myocardial infarction: Secondary | ICD-10-CM | POA: Diagnosis not present

## 2021-06-13 DIAGNOSIS — Z791 Long term (current) use of non-steroidal anti-inflammatories (NSAID): Secondary | ICD-10-CM | POA: Diagnosis not present

## 2021-06-13 DIAGNOSIS — Z7982 Long term (current) use of aspirin: Secondary | ICD-10-CM | POA: Diagnosis not present

## 2021-06-13 DIAGNOSIS — I509 Heart failure, unspecified: Secondary | ICD-10-CM | POA: Diagnosis not present

## 2021-06-13 DIAGNOSIS — K219 Gastro-esophageal reflux disease without esophagitis: Secondary | ICD-10-CM | POA: Diagnosis not present

## 2021-06-13 DIAGNOSIS — E785 Hyperlipidemia, unspecified: Secondary | ICD-10-CM | POA: Diagnosis not present

## 2021-06-13 DIAGNOSIS — E1151 Type 2 diabetes mellitus with diabetic peripheral angiopathy without gangrene: Secondary | ICD-10-CM | POA: Diagnosis not present

## 2021-06-13 DIAGNOSIS — G8929 Other chronic pain: Secondary | ICD-10-CM | POA: Diagnosis not present

## 2021-06-13 DIAGNOSIS — M4800 Spinal stenosis, site unspecified: Secondary | ICD-10-CM | POA: Diagnosis not present

## 2021-06-13 DIAGNOSIS — Z96641 Presence of right artificial hip joint: Secondary | ICD-10-CM | POA: Diagnosis not present

## 2021-06-13 DIAGNOSIS — G4733 Obstructive sleep apnea (adult) (pediatric): Secondary | ICD-10-CM | POA: Diagnosis not present

## 2021-06-13 DIAGNOSIS — Z471 Aftercare following joint replacement surgery: Secondary | ICD-10-CM | POA: Diagnosis not present

## 2021-06-13 DIAGNOSIS — Z96612 Presence of left artificial shoulder joint: Secondary | ICD-10-CM | POA: Diagnosis not present

## 2021-06-13 DIAGNOSIS — Z89511 Acquired absence of right leg below knee: Secondary | ICD-10-CM | POA: Diagnosis not present

## 2021-06-13 DIAGNOSIS — I2510 Atherosclerotic heart disease of native coronary artery without angina pectoris: Secondary | ICD-10-CM | POA: Diagnosis not present

## 2021-06-14 ENCOUNTER — Other Ambulatory Visit (INDEPENDENT_AMBULATORY_CARE_PROVIDER_SITE_OTHER): Payer: Medicare Other

## 2021-06-14 DIAGNOSIS — E538 Deficiency of other specified B group vitamins: Secondary | ICD-10-CM | POA: Diagnosis not present

## 2021-06-14 DIAGNOSIS — E1169 Type 2 diabetes mellitus with other specified complication: Secondary | ICD-10-CM | POA: Diagnosis not present

## 2021-06-14 DIAGNOSIS — E669 Obesity, unspecified: Secondary | ICD-10-CM

## 2021-06-14 DIAGNOSIS — N320 Bladder-neck obstruction: Secondary | ICD-10-CM

## 2021-06-14 DIAGNOSIS — Z202 Contact with and (suspected) exposure to infections with a predominantly sexual mode of transmission: Secondary | ICD-10-CM

## 2021-06-14 DIAGNOSIS — E559 Vitamin D deficiency, unspecified: Secondary | ICD-10-CM | POA: Diagnosis not present

## 2021-06-14 LAB — CBC WITH DIFFERENTIAL/PLATELET
Basophils Absolute: 0 10*3/uL (ref 0.0–0.1)
Basophils Relative: 0.6 % (ref 0.0–3.0)
Eosinophils Absolute: 0.3 10*3/uL (ref 0.0–0.7)
Eosinophils Relative: 7.7 % — ABNORMAL HIGH (ref 0.0–5.0)
HCT: 37.6 % — ABNORMAL LOW (ref 39.0–52.0)
Hemoglobin: 12.3 g/dL — ABNORMAL LOW (ref 13.0–17.0)
Lymphocytes Relative: 29.4 % (ref 12.0–46.0)
Lymphs Abs: 1.1 10*3/uL (ref 0.7–4.0)
MCHC: 32.7 g/dL (ref 30.0–36.0)
MCV: 88.1 fl (ref 78.0–100.0)
Monocytes Absolute: 0.3 10*3/uL (ref 0.1–1.0)
Monocytes Relative: 9.4 % (ref 3.0–12.0)
Neutro Abs: 2 10*3/uL (ref 1.4–7.7)
Neutrophils Relative %: 52.9 % (ref 43.0–77.0)
Platelets: 219 10*3/uL (ref 150.0–400.0)
RBC: 4.27 Mil/uL (ref 4.22–5.81)
RDW: 15.2 % (ref 11.5–15.5)
WBC: 3.7 10*3/uL — ABNORMAL LOW (ref 4.0–10.5)

## 2021-06-14 LAB — HEPATIC FUNCTION PANEL
ALT: 11 U/L (ref 0–53)
AST: 17 U/L (ref 0–37)
Albumin: 4 g/dL (ref 3.5–5.2)
Alkaline Phosphatase: 58 U/L (ref 39–117)
Bilirubin, Direct: 0.1 mg/dL (ref 0.0–0.3)
Total Bilirubin: 0.3 mg/dL (ref 0.2–1.2)
Total Protein: 6.4 g/dL (ref 6.0–8.3)

## 2021-06-14 LAB — MICROALBUMIN / CREATININE URINE RATIO
Creatinine,U: 42.7 mg/dL
Microalb Creat Ratio: 1.6 mg/g (ref 0.0–30.0)
Microalb, Ur: <0.7 mg/dL (ref 0.0–1.9)

## 2021-06-14 LAB — URINALYSIS, ROUTINE W REFLEX MICROSCOPIC
Bilirubin Urine: NEGATIVE
Hgb urine dipstick: NEGATIVE
Ketones, ur: NEGATIVE
Leukocytes,Ua: NEGATIVE
Nitrite: NEGATIVE
RBC / HPF: NONE SEEN (ref 0–?)
Specific Gravity, Urine: 1.01 (ref 1.000–1.030)
Total Protein, Urine: NEGATIVE
Urine Glucose: NEGATIVE
Urobilinogen, UA: 0.2 (ref 0.0–1.0)
pH: 7 (ref 5.0–8.0)

## 2021-06-14 LAB — BASIC METABOLIC PANEL
BUN: 14 mg/dL (ref 6–23)
CO2: 28 mEq/L (ref 19–32)
Calcium: 9.2 mg/dL (ref 8.4–10.5)
Chloride: 103 mEq/L (ref 96–112)
Creatinine, Ser: 0.89 mg/dL (ref 0.40–1.50)
GFR: 87.72 mL/min (ref >60.00)
Glucose, Bld: 107 mg/dL — ABNORMAL HIGH (ref 70–99)
Potassium: 4 mEq/L (ref 3.5–5.1)
Sodium: 139 mEq/L (ref 135–145)

## 2021-06-14 LAB — LIPID PANEL
Cholesterol: 129 mg/dL (ref 0–200)
HDL: 48.1 mg/dL (ref >39.00)
LDL Cholesterol: 60 mg/dL (ref 0–99)
NonHDL: 81.29
Total CHOL/HDL Ratio: 3
Triglycerides: 104 mg/dL (ref 0.0–149.0)
VLDL: 20.8 mg/dL (ref 0.0–40.0)

## 2021-06-14 LAB — TSH: TSH: 2.63 u[IU]/mL (ref 0.35–5.50)

## 2021-06-14 LAB — HEMOGLOBIN A1C: Hgb A1c MFr Bld: 5.5 % (ref 4.6–6.5)

## 2021-06-14 LAB — VITAMIN B12: Vitamin B-12: 308 pg/mL (ref 211–911)

## 2021-06-14 LAB — PSA: PSA: 0.56 ng/mL (ref 0.10–4.00)

## 2021-06-14 LAB — VITAMIN D 25 HYDROXY (VIT D DEFICIENCY, FRACTURES): VITD: 40.99 ng/mL (ref 30.00–100.00)

## 2021-06-14 NOTE — Addendum Note (Signed)
Addended by: Susy Manor on: 06/23/6689 09:58 AM   Modules accepted: Orders

## 2021-06-14 NOTE — Addendum Note (Signed)
Addended by: Susy Manor on: 3/53/2992 09:58 AM   Modules accepted: Orders

## 2021-06-14 NOTE — Addendum Note (Signed)
Addended by: Susy Manor on: 1/65/4612 09:59 AM   Modules accepted: Orders

## 2021-06-15 LAB — RPR: RPR Ser Ql: NONREACTIVE

## 2021-06-15 LAB — HSV 2 ANTIBODY, IGG: HSV 2 Glycoprotein G Ab, IgG: <0.9 index

## 2021-06-15 LAB — HIV ANTIBODY (ROUTINE TESTING W REFLEX): HIV 1&2 Ab, 4th Generation: NONREACTIVE

## 2021-06-16 ENCOUNTER — Ambulatory Visit: Payer: Commercial Managed Care - HMO | Admitting: Nurse Practitioner

## 2021-06-16 LAB — GC/CHLAMYDIA PROBE AMP
Chlamydia trachomatis, NAA: NEGATIVE
Neisseria Gonorrhoeae by PCR: NEGATIVE

## 2021-06-21 ENCOUNTER — Telehealth (INDEPENDENT_AMBULATORY_CARE_PROVIDER_SITE_OTHER): Payer: Medicare Other | Admitting: Internal Medicine

## 2021-06-21 ENCOUNTER — Encounter: Payer: Self-pay | Admitting: Internal Medicine

## 2021-06-21 ENCOUNTER — Other Ambulatory Visit: Payer: Self-pay

## 2021-06-21 DIAGNOSIS — Z89511 Acquired absence of right leg below knee: Secondary | ICD-10-CM | POA: Diagnosis not present

## 2021-06-21 DIAGNOSIS — Z471 Aftercare following joint replacement surgery: Secondary | ICD-10-CM | POA: Diagnosis not present

## 2021-06-21 DIAGNOSIS — R899 Unspecified abnormal finding in specimens from other organs, systems and tissues: Secondary | ICD-10-CM

## 2021-06-21 DIAGNOSIS — E559 Vitamin D deficiency, unspecified: Secondary | ICD-10-CM | POA: Diagnosis not present

## 2021-06-21 DIAGNOSIS — Z7982 Long term (current) use of aspirin: Secondary | ICD-10-CM | POA: Diagnosis not present

## 2021-06-21 DIAGNOSIS — G4733 Obstructive sleep apnea (adult) (pediatric): Secondary | ICD-10-CM | POA: Diagnosis not present

## 2021-06-21 DIAGNOSIS — G8929 Other chronic pain: Secondary | ICD-10-CM | POA: Diagnosis not present

## 2021-06-21 DIAGNOSIS — Z96641 Presence of right artificial hip joint: Secondary | ICD-10-CM | POA: Diagnosis not present

## 2021-06-21 DIAGNOSIS — E7800 Pure hypercholesterolemia, unspecified: Secondary | ICD-10-CM | POA: Diagnosis not present

## 2021-06-21 DIAGNOSIS — E785 Hyperlipidemia, unspecified: Secondary | ICD-10-CM | POA: Diagnosis not present

## 2021-06-21 DIAGNOSIS — I2510 Atherosclerotic heart disease of native coronary artery without angina pectoris: Secondary | ICD-10-CM | POA: Diagnosis not present

## 2021-06-21 DIAGNOSIS — I252 Old myocardial infarction: Secondary | ICD-10-CM | POA: Diagnosis not present

## 2021-06-21 DIAGNOSIS — E1151 Type 2 diabetes mellitus with diabetic peripheral angiopathy without gangrene: Secondary | ICD-10-CM | POA: Diagnosis not present

## 2021-06-21 DIAGNOSIS — Z791 Long term (current) use of non-steroidal anti-inflammatories (NSAID): Secondary | ICD-10-CM | POA: Diagnosis not present

## 2021-06-21 DIAGNOSIS — E669 Obesity, unspecified: Secondary | ICD-10-CM

## 2021-06-21 DIAGNOSIS — I509 Heart failure, unspecified: Secondary | ICD-10-CM | POA: Diagnosis not present

## 2021-06-21 DIAGNOSIS — N200 Calculus of kidney: Secondary | ICD-10-CM | POA: Diagnosis not present

## 2021-06-21 DIAGNOSIS — K219 Gastro-esophageal reflux disease without esophagitis: Secondary | ICD-10-CM | POA: Diagnosis not present

## 2021-06-21 DIAGNOSIS — M4800 Spinal stenosis, site unspecified: Secondary | ICD-10-CM | POA: Diagnosis not present

## 2021-06-21 DIAGNOSIS — E1169 Type 2 diabetes mellitus with other specified complication: Secondary | ICD-10-CM | POA: Diagnosis not present

## 2021-06-21 DIAGNOSIS — Z96612 Presence of left artificial shoulder joint: Secondary | ICD-10-CM | POA: Diagnosis not present

## 2021-06-21 NOTE — Assessment & Plan Note (Signed)
Lab Results  Component Value Date   HGBA1C 5.5 06/14/2021   Stable, pt to continue current medical treatment  - diet

## 2021-06-21 NOTE — Assessment & Plan Note (Signed)
D/w pt - labs are benign including negative Herpes 2 testing, and slightly elevated eosinophills likely allergy related, Ok to follow, no other specific eval and tx needed, pt reassured

## 2021-06-21 NOTE — Assessment & Plan Note (Signed)
Last vitamin D Lab Results  Component Value Date   VD25OH 40.99 06/14/2021   Stable, cont oral replacement
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
## 2021-06-21 NOTE — Assessment & Plan Note (Signed)
Lab Results  Component Value Date   LDLCALC 60 06/14/2021   Stable, pt to continue current statin liptior 20

## 2021-06-21 NOTE — Patient Instructions (Signed)
Please continue all other medications as before, and refills have been done if requested.  Please have the pharmacy call with any other refills you may need.  Please continue your efforts at being more active, low cholesterol diet, and weight control.  Please keep your appointments with your specialists as you may have planned     

## 2021-06-22 ENCOUNTER — Ambulatory Visit (INDEPENDENT_AMBULATORY_CARE_PROVIDER_SITE_OTHER): Payer: Medicaid Other | Admitting: Orthopedic Surgery

## 2021-06-22 ENCOUNTER — Encounter: Payer: Self-pay | Admitting: Orthopedic Surgery

## 2021-06-22 DIAGNOSIS — Z96612 Presence of left artificial shoulder joint: Secondary | ICD-10-CM

## 2021-06-23 ENCOUNTER — Encounter: Payer: Self-pay | Admitting: Orthopedic Surgery

## 2021-06-23 ENCOUNTER — Ambulatory Visit: Payer: Medicare Other | Admitting: Neurology
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
## 2021-06-24 ENCOUNTER — Other Ambulatory Visit: Payer: Self-pay | Admitting: Internal Medicine

## 2021-06-27 ENCOUNTER — Encounter (INDEPENDENT_AMBULATORY_CARE_PROVIDER_SITE_OTHER): Payer: Self-pay | Admitting: Family Medicine

## 2021-06-27 ENCOUNTER — Ambulatory Visit (INDEPENDENT_AMBULATORY_CARE_PROVIDER_SITE_OTHER): Payer: Medicare Other | Admitting: Family Medicine

## 2021-06-27 ENCOUNTER — Other Ambulatory Visit: Payer: Self-pay

## 2021-06-27 VITALS — BP 128/77 | HR 60 | Temp 98.2°F | Ht 70.0 in | Wt 281.0 lb

## 2021-06-27 DIAGNOSIS — F509 Eating disorder, unspecified: Secondary | ICD-10-CM

## 2021-06-27 DIAGNOSIS — E669 Obesity, unspecified: Secondary | ICD-10-CM

## 2021-06-27 DIAGNOSIS — Z6841 Body Mass Index (BMI) 40.0 and over, adult: Secondary | ICD-10-CM

## 2021-06-27 DIAGNOSIS — E1169 Type 2 diabetes mellitus with other specified complication: Secondary | ICD-10-CM | POA: Diagnosis not present

## 2021-06-27 NOTE — Progress Notes (Addendum)
Chief Complaint:   OBESITY Travis [name] is here to discuss his progress with his obesity treatment plan along with follow-up of his obesity related diagnoses. Travis [name] is on the Category 4 Plan and states he is following his eating plan approximately 65% of the time. Travis [name] states he is doing 0 minutes 0 times per week.  Today's visit was #: 5 Starting weight: 302 lbs Starting date: 04/06/2021 Today's weight: 281 lbs Today's date: 06/27/2021 Total lbs lost to date: 21 lbs Total lbs lost since last in-office visit: 4 lbs  Interim History: This is my first visit with [facility].  He usually sees [name]. Travis [name] generally has Ramen noodles or nuts for a snack.  However he does say that he keeps snacks below 400 cal/day. He eats all of the prescribed protein. He reports some hunger, but weight loss has been very good since starting our program. He cooks breakfast and dinner. He was a Biomedical scientist for 35 years. He denies intake of sugar sweetened beverages.  He sometimes only has 2 meals per day because he tends to sleep late.  Subjective:   1. Type 2 diabetes mellitus with other specified complication, without long-term current use of insulin (HCC) Well-controlled.  Travis [name] last A1C was 5.5. He does not check CBGs at home. He does note some hunger between meals. He is not on medications currently. He has Pancreatic Exocrine deficiency.  Lab Results  Component Value Date   HGBA1C 5.5 06/14/2021   HGBA1C 5.6 04/11/2021   HGBA1C 5.8 (H) 03/09/2021   Lab Results  Component Value Date   MICROALBUR <0.7 06/14/2021   LDLCALC 60 06/14/2021   CREATININE 0.89 06/14/2021   Lab Results  Component Value Date   INSULIN 12.2 04/11/2021    2. Eating disorder (Emotional Eating) Travis [name] notes he eats due to chronic pain. He has chronic back pain. He sees a psychiatrist every 3 months for depression. He says his depression is well controlled. He feels that he eats out of boredom.  His son died several years  ago and he reports he ate out of grief.  He had a sleeve gastrectomy in October 2020.  High weight was 380 pounds.  Low weight was 225 pounds.  The death of his son prompted his weight regain.  Assessment/Plan:   1. Type 2 diabetes mellitus with other specified complication, without long-term current use of insulin (HCC) Travis [name] has a follow up with GI this week. He will ask if a GLP-1 is appropriate.   2. Eating disorder (Emotional Eating) Travis [name] was referred to Dr. Mallie Mussel our Bariatric Psychologist. Behavior modification techniques were discussed today to help Rama deal with his emotional/non-hunger eating behaviors.  Orders and follow up as documented in patient record.    3. Obesity with current BMI of 40.32 Travis [name] is currently in the action stage of change. As such, his goal is to continue with weight loss efforts. He has agreed to the Category 4 Plan.   Travis [name] will be more aware of keeping snack calories < than 400.  Exercise goals: No exercise has been prescribed at this time.  Behavioral modification strategies: decreasing simple carbohydrates.  [place] has agreed to follow-up with our clinic in 2-3 weeks with [name] and 1-3 weeks with Dr. Mallie Mussel.   Objective:   Blood pressure 128/77, pulse 60, temperature 98.2 F (36.8 C), height 5' 10"  (1.778 m), weight 281 lb (127.5 kg), SpO2 97 %. Body mass index is 40.32 kg/m.  General: Cooperative, alert, well  developed, in no acute distress. HEENT: Conjunctivae and lids unremarkable. Cardiovascular: Regular rhythm.  Lungs: Normal work of breathing. Neurologic: No focal deficits.   Lab Results  Component Value Date   CREATININE 0.89 06/14/2021   BUN 14 06/14/2021   NA 139 06/14/2021   K 4.0 06/14/2021   CL 103 06/14/2021   CO2 28 06/14/2021   Lab Results  Component Value Date   ALT 11 06/14/2021   AST 17 06/14/2021   ALKPHOS 58 06/14/2021   BILITOT 0.3 06/14/2021   Lab Results  Component Value Date   HGBA1C  5.5 06/14/2021   HGBA1C 5.6 04/11/2021   HGBA1C 5.8 (H) 03/09/2021   HGBA1C 6.1 12/01/2020   HGBA1C 5.7 (A) 02/24/2020   Lab Results  Component Value Date   INSULIN 12.2 04/11/2021   Lab Results  Component Value Date   TSH 2.63 06/14/2021   Lab Results  Component Value Date   CHOL 129 06/14/2021   HDL 48.10 06/14/2021   LDLCALC 60 06/14/2021   TRIG 104.0 06/14/2021   CHOLHDL 3 06/14/2021   Lab Results  Component Value Date   VD25OH 40.99 06/14/2021   VD25OH 51.0 04/11/2021   VD25OH 53.07 12/01/2020   Lab Results  Component Value Date   WBC 3.7 (L) 06/14/2021   HGB 12.3 (L) 06/14/2021   HCT 37.6 (L) 06/14/2021   MCV 88.1 06/14/2021   PLT 219.0 06/14/2021   Lab Results  Component Value Date   IRON 49 12/01/2020   TIBC 186 (L) 10/18/2014   FERRITIN 25.6 12/01/2020   Attestation Statements:   Reviewed by clinician on day of visit: allergies, medications, problem list, medical history, surgical history, family history, social history, and previous encounter notes.  I, Lizbeth Bark, RMA, am acting as Location manager for Fordyce Schwab, [place].  I have reviewed the above documentation for accuracy and completeness, and I agree with the above. -  Georgianne Fick, FNP

## 2021-06-27 NOTE — Progress Notes (Signed)
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 restless that it's hard to sit still 3  Becoming easily annoyed or irritable 0  Feeling afraid as if something awful might happen 0  GAD-7 Score 4   Interventions:  Conducted a chart review Focused on rapport building Verbally administered PHQ-9 and GAD-7 for symptom monitoring Provided emphatic reflections and validation Collaborated with  patient on a treatment goal  Psychoeducation provided regarding physical versus emotional hunger Conducted a risk assessment Developed a coping card Recommended/discussed option for longer-term therapeutic services  Provisional DSM-5 Diagnosis(es): F32.89 Other Specified Depressive Disorder, Emotional and Binge Eating Behaviors  Plan: Kobey appears able and willing to participate as evidenced by collaboration on a treatment goal, engagement in reciprocal conversation, and asking questions as needed for clarification. The next appointment will be scheduled in approximately two weeks, which will be via MyChart Video Visit. The following treatment goal was established: increase coping skills. This provider will regularly review the treatment plan and medical chart to keep informed of status changes. Javier expressed  understanding and agreement with the initial treatment plan of care. Tyshon will be sent a handout via e-mail to utilize between now and the next appointment to increase awareness of hunger patterns and subsequent eating. Kenry provided verbal consent during today's appointment for this provider to send the handout via e-mail. Additionally, a referral will be placed with [facility].

## 2021-06-28 ENCOUNTER — Encounter (INDEPENDENT_AMBULATORY_CARE_PROVIDER_SITE_OTHER): Payer: Self-pay | Admitting: Family Medicine

## 2021-06-28 ENCOUNTER — Telehealth: Payer: Self-pay | Admitting: Orthopedic Surgery

## 2021-06-28 ENCOUNTER — Telehealth: Payer: Medicaid Other | Admitting: Adult Health

## 2021-06-28 ENCOUNTER — Ambulatory Visit (INDEPENDENT_AMBULATORY_CARE_PROVIDER_SITE_OTHER): Payer: Commercial Managed Care - HMO | Admitting: Family Medicine

## 2021-06-28 DIAGNOSIS — I2510 Atherosclerotic heart disease of native coronary artery without angina pectoris: Secondary | ICD-10-CM | POA: Diagnosis not present

## 2021-06-28 DIAGNOSIS — F509 Eating disorder, unspecified: Secondary | ICD-10-CM | POA: Insufficient documentation

## 2021-06-28 DIAGNOSIS — E785 Hyperlipidemia, unspecified: Secondary | ICD-10-CM | POA: Diagnosis not present

## 2021-06-28 DIAGNOSIS — Z471 Aftercare following joint replacement surgery: Secondary | ICD-10-CM | POA: Diagnosis not present

## 2021-06-28 DIAGNOSIS — I252 Old myocardial infarction: Secondary | ICD-10-CM | POA: Diagnosis not present

## 2021-06-28 DIAGNOSIS — Z96641 Presence of right artificial hip joint: Secondary | ICD-10-CM | POA: Diagnosis not present

## 2021-06-28 DIAGNOSIS — G8929 Other chronic pain: Secondary | ICD-10-CM | POA: Diagnosis not present

## 2021-06-28 DIAGNOSIS — M4326 Fusion of spine, lumbar region: Secondary | ICD-10-CM | POA: Diagnosis not present

## 2021-06-28 DIAGNOSIS — N200 Calculus of kidney: Secondary | ICD-10-CM | POA: Diagnosis not present

## 2021-06-28 DIAGNOSIS — M533 Sacrococcygeal disorders, not elsewhere classified: Secondary | ICD-10-CM | POA: Diagnosis not present

## 2021-06-28 DIAGNOSIS — K219 Gastro-esophageal reflux disease without esophagitis: Secondary | ICD-10-CM | POA: Diagnosis not present

## 2021-06-28 DIAGNOSIS — I509 Heart failure, unspecified: Secondary | ICD-10-CM | POA: Diagnosis not present

## 2021-06-28 DIAGNOSIS — E1151 Type 2 diabetes mellitus with diabetic peripheral angiopathy without gangrene: Secondary | ICD-10-CM | POA: Diagnosis not present

## 2021-06-28 DIAGNOSIS — Z89511 Acquired absence of right leg below knee: Secondary | ICD-10-CM | POA: Diagnosis not present

## 2021-06-28 DIAGNOSIS — M4800 Spinal stenosis, site unspecified: Secondary | ICD-10-CM | POA: Diagnosis not present

## 2021-06-28 DIAGNOSIS — Z791 Long term (current) use of non-steroidal anti-inflammatories (NSAID): Secondary | ICD-10-CM | POA: Diagnosis not present

## 2021-06-28 DIAGNOSIS — M48061 Spinal stenosis, lumbar region without neurogenic claudication: Secondary | ICD-10-CM | POA: Diagnosis not present

## 2021-06-28 DIAGNOSIS — M4726 Other spondylosis with radiculopathy, lumbar region: Secondary | ICD-10-CM | POA: Diagnosis not present

## 2021-06-28 DIAGNOSIS — Z96612 Presence of left artificial shoulder joint: Secondary | ICD-10-CM | POA: Diagnosis not present

## 2021-06-28 DIAGNOSIS — G4733 Obstructive sleep apnea (adult) (pediatric): Secondary | ICD-10-CM | POA: Diagnosis not present

## 2021-06-28 DIAGNOSIS — Z7982 Long term (current) use of aspirin: Secondary | ICD-10-CM | POA: Diagnosis not present

## 2021-06-28 NOTE — Telephone Encounter (Signed)
Ok thx.

## 2021-06-28 NOTE — Telephone Encounter (Signed)
[facility] called and states that pt is complaining a 9/10 back pain. He is going today to pain management today to get an injection.   CB [phone]

## 2021-06-28 NOTE — Telephone Encounter (Signed)
FYI

## 2021-06-29 ENCOUNTER — Other Ambulatory Visit: Payer: Self-pay

## 2021-06-29 ENCOUNTER — Telehealth (HOSPITAL_BASED_OUTPATIENT_CLINIC_OR_DEPARTMENT_OTHER): Payer: Medicare Other | Admitting: Psychiatry

## 2021-06-29 ENCOUNTER — Encounter (HOSPITAL_COMMUNITY): Payer: Self-pay | Admitting: Psychiatry

## 2021-06-29 DIAGNOSIS — F319 Bipolar disorder, unspecified: Secondary | ICD-10-CM

## 2021-06-29 DIAGNOSIS — F419 Anxiety disorder, unspecified: Secondary | ICD-10-CM

## 2021-06-29 MED ORDER — DULOXETINE HCL 20 MG PO CPEP: 20.0000 mg | ORAL_CAPSULE | Freq: Two times a day (BID) | ORAL | 0 refills | Status: DC

## 2021-06-29 MED ORDER — TRAZODONE HCL 150 MG PO TABS: 150.0000 mg | ORAL_TABLET | Freq: Every day | ORAL | 0 refills | Status: DC

## 2021-06-29 MED ORDER — ARIPIPRAZOLE 10 MG PO TABS: 10.0000 mg | ORAL_TABLET | Freq: Every day | ORAL | 0 refills | Status: DC

## 2021-06-29 NOTE — Progress Notes (Signed)
Virtual Visit via Video Note  I connected with Travis Novak Sr. on 06/29/21 at 10:00 AM EST by a video enabled telemedicine application and verified that I am speaking with the correct person using two identifiers.  Location: Patient: Home Provider: Home Office   I discussed the limitations of evaluation and management by telemedicine and the availability of in person appointments. The patient expressed understanding and agreed to proceed.  History of Present Illness: Patient is evaluated by video session.  He is very happy since he moved into his own place.  Though he does not drive facility has provided her driver and that helps to get his groceries.  He is also going to wellness program and able to lost another 10 pounds in past 3 months.  He is sad because not able to go to [place] to visit his great grandkids but he has a plan soon to visit them.  Overall he feels things are going well.  He denies any mania, psychosis, hallucination or any severe mood swings.  He denies any panic attack.  He has some issues with his prosthesis and that need to be adjusted.  Yesterday her physical therapist came to help him and he is hoping his prosthesis get fixed.  He does not drive but able to walk slowly.  He is taking multiple medication for his pain, blood pressure, neuropathy.  He has no tremors, shakes or any EPS.  He sleeps good with the help of CPAP and does not require melatonin.  However he is still taking Ambien prescribed by another physician.  Past Psychiatric History:  Saw psychiatrist in [place] and [place].  H/O mood swings, suicidal gesture, anger, hallucination, irritability and increase spending. Married 5 times. H/O inpatient in 2014 due to overdose on Xanax and alcohol.  Did IOP. Took Effexor, Xanax, Klonopin, Ambien, Lunesta, Wellbutrin, Ambien, Remeron and Prozac. Abilify started in our office.     Recent Results (from the past 2160 hour(s))  Comprehensive metabolic panel      Status: Abnormal   Collection Time: 04/11/21  9:49 AM  Result Value Ref Range   Glucose 114 (H) 70 - 99 mg/dL   BUN 19 8 - 27 mg/dL   Creatinine, Ser 0.98 0.76 - 1.27 mg/dL   eGFR 84 >59 mL/min/1.73   BUN/Creatinine Ratio 19 10 - 24   Sodium 141 134 - 144 mmol/L   Potassium 4.0 3.5 - 5.2 mmol/L   Chloride 102 96 - 106 mmol/L   CO2 24 20 - 29 mmol/L   Calcium 9.3 8.6 - 10.2 mg/dL   Total Protein 6.5 6.0 - 8.5 g/dL   Albumin 4.1 3.8 - 4.8 g/dL   Globulin, Total 2.4 1.5 - 4.5 g/dL   Albumin/Globulin Ratio 1.7 1.2 - 2.2   Bilirubin Total 0.4 0.0 - 1.2 mg/dL   Alkaline Phosphatase 82 44 - 121 IU/L   AST 17 0 - 40 IU/L   ALT 8 0 - 44 IU/L  Insulin, random     Status: None   Collection Time: 04/11/21  9:49 AM  Result Value Ref Range   INSULIN 12.2 2.6 - 24.9 uIU/mL  Hemoglobin A1c     Status: None   Collection Time: 04/11/21  9:49 AM  Result Value Ref Range   Hgb A1c MFr Bld 5.6 4.8 - 5.6 %    Comment:          Prediabetes: 5.7 - 6.4          Diabetes: >6.4  Glycemic control for adults with diabetes: <7.0    Est. average glucose Bld gHb Est-mCnc 114 mg/dL  Lipid Panel With LDL/HDL Ratio     Status: None   Collection Time: 04/11/21  9:49 AM  Result Value Ref Range   Cholesterol, Total 125 100 - 199 mg/dL   Triglycerides 96 0 - 149 mg/dL   HDL 48 >39 mg/dL   VLDL Cholesterol Cal 18 5 - 40 mg/dL   LDL Chol Calc (NIH) 59 0 - 99 mg/dL   LDL/HDL Ratio 1.2 0.0 - 3.6 ratio    Comment:                                     LDL/HDL Ratio                                             Men  Women                               1/2 Avg.Risk  1.0    1.5                                   Avg.Risk  3.6    3.2                                2X Avg.Risk  6.2    5.0                                3X Avg.Risk  8.0    6.1   VITAMIN D 25 Hydroxy (Vit-D Deficiency, Fractures)     Status: None   Collection Time: 04/11/21  9:49 AM  Result Value Ref Range   Vit D, 25-Hydroxy 51.0 30.0 - 100.0  ng/mL    Comment: Vitamin D deficiency has been defined by the [place] practice guideline as a level of serum 25-OH vitamin D less than 20 ng/mL (1,2). The Endocrine Society went on to further define vitamin D insufficiency as a level between 21 and 29 ng/mL (2). 1. IOM (Institute of Medicine). 2010. Dietary reference    intakes for calcium and D. [place]: The    [employer]. 2. Holick MF, Binkley Judson, Bischoff-Ferrari HA, et al.    Evaluation, treatment, and prevention of vitamin D    deficiency: an Endocrine Society clinical practice    guideline. JCEM. 2011 Jul; 96(7):1911-30.   T3     Status: None   Collection Time: 04/11/21  9:49 AM  Result Value Ref Range   T3, Total 123 71 - 180 ng/dL  T4, free     Status: None   Collection Time: 04/11/21  9:49 AM  Result Value Ref Range   Free T4 1.22 0.82 - 1.77 ng/dL  TSH     Status: None   Collection Time: 04/11/21  9:49 AM  Result Value Ref Range   TSH 1.960 0.450 - 4.500 uIU/mL  CBC     Status: Abnormal   Collection Time: 04/13/21  3:21 PM  Result Value Ref Range   WBC 5.7 3.4 - 10.8 x10E3/uL   RBC 3.66 (L) 4.14 - 5.80 x10E6/uL   Hemoglobin 11.4 (L) 13.0 - 17.7 g/dL   Hematocrit 32.9 (L) 37.5 - 51.0 %   MCV 90 79 - 97 fL   MCH 31.1 26.6 - 33.0 pg   MCHC 34.7 31.5 - 35.7 g/dL   RDW 13.3 11.6 - 15.4 %   Platelets 327 150 - 450 x10E3/uL  HIV Antibody (routine testing w rflx)     Status: None   Collection Time: 06/14/21  9:59 AM  Result Value Ref Range   HIV 1&2 Ab, 4th Generation NON-REACTIVE NON-REACTIVE    Comment: HIV-1 antigen and HIV-1/HIV-2 antibodies were not detected. There is no laboratory evidence of HIV infection. [name] PLEASE NOTE: This information has been disclosed to you from records whose confidentiality may be protected by state law.  If your state requires such protection, then the state law prohibits you from making any further disclosure of the  information without the specific written consent of the person to whom it pertains, or as otherwise permitted by law. A general authorization for the release of medical or other information is NOT sufficient for this purpose. . For additional information please refer to http://education.[facility].com/faq/FAQ106 (This link is being provided for informational/ educational purposes only.) . [name] The performance of this assay has not been clinically validated in patients less than 80 years old. [name]   GC/Chlamydia Probe Amp     Status: None   Collection Time: 06/14/21  9:59 AM   Specimen: Blood   BLD  Result Value Ref Range   Chlamydia trachomatis, NAA Negative Negative   Neisseria Gonorrhoeae by PCR Negative Negative  RPR     Status: None   Collection Time: 06/14/21  9:59 AM  Result Value Ref Range   RPR Ser Ql NON-REACTIVE NON-REACTIVE  HSV 2 antibody, IgG     Status: None   Collection Time: 06/14/21  9:59 AM  Result Value Ref Range   HSV 2 Glycoprotein G Ab, IgG <0.90 index    Comment:                           Index          Interpretation                           -----          --------------                           <0.90          Negative                           0.90-1.09      Equivocal                           >1.09          Positive . This assay utilizes recombinant type-specific antigens to differentiate HSV-1 from HSV-2 infections. A positive result cannot distinguish between recent and past infection. If recent HSV infection is suspected but the results are negative or equivocal, the assay should be repeated in 4-6 weeks. The performance characteristics of the assay have not been established for  pediatric populations, immunocompromised patients, or neonatal screening. . . For additional information, please refer to  http://education.[facility].com/faq/FAQ118  (This link is being provided for informational/ educational purposes only.) .    VITAMIN D 25 Hydroxy (Vit-D Deficiency, Fractures)     Status: None   Collection Time: 06/14/21  9:59 AM  Result Value Ref Range   VITD 40.99 30.00 - 100.00 ng/mL  Vitamin B12     Status: None   Collection Time: 06/14/21  9:59 AM  Result Value Ref Range   Vitamin B-12 308 211 - 911 pg/mL  Basic metabolic panel     Status: Abnormal   Collection Time: 06/14/21  9:59 AM  Result Value Ref Range   Sodium 139 135 - 145 mEq/L   Potassium 4.0 3.5 - 5.1 mEq/L   Chloride 103 96 - 112 mEq/L   CO2 28 19 - 32 mEq/L   Glucose, Bld 107 (H) 70 - 99 mg/dL   BUN 14 6 - 23 mg/dL   Creatinine, Ser 0.89 0.40 - 1.50 mg/dL   GFR 87.72 >60.00 mL/min    Comment: Calculated using the CKD-EPI Creatinine Equation (2021)   Calcium 9.2 8.4 - 10.5 mg/dL  PSA     Status: None   Collection Time: 06/14/21  9:59 AM  Result Value Ref Range   PSA 0.56 0.10 - 4.00 ng/mL    Comment: Test performed using Access Hybritech PSA Assay, a parmagnetic partical, chemiluminecent immunoassay.  Urinalysis, Routine w reflex microscopic     Status: None   Collection Time: 06/14/21  9:59 AM  Result Value Ref Range   Color, Urine YELLOW Yellow;Lt. Yellow;Straw;Dark Yellow;Amber;Green;Red;Brown   APPearance CLEAR Clear;Turbid;Slightly Cloudy;Cloudy   Specific Gravity, Urine 1.010 1.000 - 1.030   pH 7.0 5.0 - 8.0   Total Protein, Urine NEGATIVE Negative   Urine Glucose NEGATIVE Negative   Ketones, ur NEGATIVE Negative   Bilirubin Urine NEGATIVE Negative   Hgb urine dipstick NEGATIVE Negative   Urobilinogen, UA 0.2 0.0 - 1.0   Leukocytes,Ua NEGATIVE Negative   Nitrite NEGATIVE Negative   WBC, UA 0-2/hpf 0-2/hpf   RBC / HPF none seen 0-2/hpf   Squamous Epithelial / LPF Rare(0-4/hpf) Rare(0-4/hpf)  TSH     Status: None   Collection Time: 06/14/21  9:59 AM  Result Value Ref Range   TSH 2.63 0.35 - 5.50 uIU/mL  CBC with Differential/Platelet     Status: Abnormal   Collection Time: 06/14/21  9:59 AM  Result Value Ref Range    WBC 3.7 (L) 4.0 - 10.5 K/uL   RBC 4.27 4.22 - 5.81 Mil/uL   Hemoglobin 12.3 (L) 13.0 - 17.0 g/dL   HCT 37.6 (L) 39.0 - 52.0 %   MCV 88.1 78.0 - 100.0 fl   MCHC 32.7 30.0 - 36.0 g/dL   RDW 15.2 11.5 - 15.5 %   Platelets 219.0 150.0 - 400.0 K/uL   Neutrophils Relative % 52.9 43.0 - 77.0 %   Lymphocytes Relative 29.4 12.0 - 46.0 %   Monocytes Relative 9.4 3.0 - 12.0 %   Eosinophils Relative 7.7 (H) 0.0 - 5.0 %   Basophils Relative 0.6 0.0 - 3.0 %   Neutro Abs 2.0 1.4 - 7.7 K/uL   Lymphs Abs 1.1 0.7 - 4.0 K/uL   Monocytes Absolute 0.3 0.1 - 1.0 K/uL   Eosinophils Absolute 0.3 0.0 - 0.7 K/uL   Basophils Absolute 0.0 0.0 - 0.1 K/uL  Hepatic function panel     Status: None  Collection Time: 06/14/21  9:59 AM  Result Value Ref Range   Total Bilirubin 0.3 0.2 - 1.2 mg/dL   Bilirubin, Direct 0.1 0.0 - 0.3 mg/dL   Alkaline Phosphatase 58 39 - 117 U/L   AST 17 0 - 37 U/L   ALT 11 0 - 53 U/L   Total Protein 6.4 6.0 - 8.3 g/dL   Albumin 4.0 3.5 - 5.2 g/dL  Lipid panel     Status: None   Collection Time: 06/14/21  9:59 AM  Result Value Ref Range   Cholesterol 129 0 - 200 mg/dL    Comment: ATP III Classification       Desirable:  < 200 mg/dL               Borderline High:  200 - 239 mg/dL          High:  > = 240 mg/dL   Triglycerides 104.0 0.0 - 149.0 mg/dL    Comment: Normal:  <150 mg/dLBorderline High:  150 - 199 mg/dL   HDL 48.10 >39.00 mg/dL   VLDL 20.8 0.0 - 40.0 mg/dL   LDL Cholesterol 60 0 - 99 mg/dL   Total CHOL/HDL Ratio 3     Comment:                Men          Women1/2 Average Risk     3.4          3.3Average Risk          5.0          4.42X Average Risk          9.6          7.13X Average Risk          15.0          11.0                       NonHDL 81.29     Comment: NOTE:  Non-HDL goal should be 30 mg/dL higher than patient's LDL goal (i.e. LDL goal of < 70 mg/dL, would have non-HDL goal of < 100 mg/dL)  Hemoglobin A1c     Status: None   Collection Time: 06/14/21  9:59 AM   Result Value Ref Range   Hgb A1c MFr Bld 5.5 4.6 - 6.5 %    Comment: Glycemic Control Guidelines for People with Diabetes:Non Diabetic:  <6%Goal of Therapy: <7%Additional Action Suggested:  >8%   Microalbumin / creatinine urine ratio     Status: None   Collection Time: 06/14/21  9:59 AM  Result Value Ref Range   Microalb, Ur <0.7 0.0 - 1.9 mg/dL   Creatinine,U 42.7 mg/dL   Microalb Creat Ratio 1.6 0.0 - 30.0 mg/g     Psychiatric Specialty Exam: Physical Exam  Review of Systems  Weight 281 lb (127.5 kg).There is no height or weight on file to calculate BMI.  General Appearance: Fairly Groomed  Eye Contact:  Fair  Speech:  Slow  Volume:  Normal  Mood:  Euthymic  Affect:  Congruent  Thought Process:  Goal Directed  Orientation:  Full (Time, Place, and Person)  Thought Content:  Logical  Suicidal Thoughts:  No  Homicidal Thoughts:  No  Memory:  Immediate;   Good Recent;   Fair Remote;   Fair  Judgement:  Intact  Insight:  Present  Psychomotor Activity:  Normal  Concentration:  Concentration: Fair and Attention  Span: Fair  Recall:  Good  Fund of Knowledge:  Good  Language:  Good  Akathisia:  No  Handed:  Right  AIMS (if indicated):     Assets:  Communication Skills Desire for Improvement Housing Resilience  ADL's:  Intact  Cognition:  WNL  Sleep:   ok      Assessment and Plan: Bipolar disorder type I.  Anxiety.  I reviewed blood work results.  His hemoglobin is improved and his weight is less than before.  He is more active.  He is happy at his new place.  Patient is hoping to adjust his processes on his right leg so he can start walking better.  He does not want to change the medication.  We did talk about his other medication.  Patient is taking multiple medication for chronic health needs and does not want to cut down since it is working well.  Continue Abilify 10 mg daily, trazodone 150 mg at bedtime and Cymbalta 20 mg twice a day.  He does not feel he need a  therapist at this time.  Discussed medication side effects and benefits.  Recommended to call us back if is any question or any concern.  Follow-up in 3 months.  Follow Up Instructions:    I discussed the assessment and treatment plan with the patient. The patient was provided an opportunity to ask questions and all were answered. The patient agreed with the plan and demonstrated an understanding of the instructions.   The patient was advised to call back or seek an in-person evaluation if the symptoms worsen or if the condition fails to improve as anticipated.  I provided 21 minutes of non-face-to-face time during this encounter.   Kathlee Nations, MD

## 2021-06-30 ENCOUNTER — Other Ambulatory Visit: Payer: Self-pay | Admitting: Internal Medicine

## 2021-06-30 NOTE — Telephone Encounter (Signed)
Please refill as per office routine med refill policy (all routine meds to be refilled for 3 mo or monthly (per pt preference) up to one year from last visit, then month to month grace period for 3 mo, then further med refills will have to be denied) ? ?

## 2021-07-01 ENCOUNTER — Other Ambulatory Visit (HOSPITAL_COMMUNITY): Payer: Self-pay | Admitting: *Deleted

## 2021-07-01 ENCOUNTER — Telehealth: Payer: Self-pay | Admitting: Nurse Practitioner

## 2021-07-01 ENCOUNTER — Ambulatory Visit (INDEPENDENT_AMBULATORY_CARE_PROVIDER_SITE_OTHER): Payer: Medicare Other | Admitting: Nurse Practitioner

## 2021-07-01 ENCOUNTER — Encounter: Payer: Self-pay | Admitting: Nurse Practitioner

## 2021-07-01 VITALS — BP 110/60 | HR 57 | Ht 70.0 in | Wt 282.4 lb

## 2021-07-01 DIAGNOSIS — R1310 Dysphagia, unspecified: Secondary | ICD-10-CM

## 2021-07-01 DIAGNOSIS — R1312 Dysphagia, oropharyngeal phase: Secondary | ICD-10-CM

## 2021-07-01 DIAGNOSIS — J9620 Acute and chronic respiratory failure, unspecified whether with hypoxia or hypercapnia: Secondary | ICD-10-CM | POA: Diagnosis not present

## 2021-07-01 DIAGNOSIS — R0600 Dyspnea, unspecified: Secondary | ICD-10-CM | POA: Diagnosis not present

## 2021-07-01 NOTE — Telephone Encounter (Signed)
Inbound call from patient stated that he forgot to ask if its okay if he takes Ozempic for weight management for his diabetes. Please advise.

## 2021-07-01 NOTE — Progress Notes (Signed)
Agree with assessment and plan as outlined.  

## 2021-07-01 NOTE — Patient Instructions (Signed)
If you are age 69 or older, your body mass index should be between 23-30. Your Body mass index is 40.52 kg/m. If this is out of the aforementioned range listed, please consider follow up with your Primary Care Provider.  If you are age 64 or younger, your body mass index should be between 19-25. Your Body mass index is 40.52 kg/m. If this is out of the aformentioned range listed, please consider follow up with your Primary Care Provider.   The [facility] providers would like to encourage you to use [facility] to communicate with providers for non-urgent requests or questions.  Due to long hold times on the telephone, sending your provider a message by [facility] may be a faster and more efficient way to get a response.  Please allow 48 business hours for a response.  Please remember that this is for non-urgent requests.   You have been scheduled for a modified barium swallow on 07/08/21 at 11:00am. Please arrive 15 minutes prior to your test for registration. You will go to [facility]  Radiology (1st Floor) for your appointment. Should you need to cancel or reschedule your appointment, please contact [phone] [place]) or [phone] [place]). _____________________________________________________________________ A Modified Barium Swallow Study, or MBS, is a special x-ray that is taken to check swallowing skills. It is carried out by a Stage manager and a Psychologist, clinical (SLP). During this test, yourmouth, throat, and esophagus, a muscular tube which connects your mouth to your stomach, is checked. The test will help you, your doctor, and the SLP plan what types of foods and liquids are easier for you to swallow. The SLP will also identify positions and ways to help you swallow more easily and safely. What will happen during an MBS? You will be taken to an x-ray room and seated comfortably. You will be asked to swallow small amounts of food and liquid mixed with barium. Barium is a  liquid or paste that allows images of your mouth, throat and esophagus to be seen on x-ray. The x-ray captures moving images of the food you are swallowing as it travels from your mouth through your throat and into your esophagus. This test helps identify whether food or liquid is entering your lungs (aspiration). The test also shows which part of your mouth or throat lacks strength or coordination to move the food or liquid in the right direction. This test typically takes 30 minutes to 1 hour to complete.  It was a pleasure to see you today!  Thank you for trusting me with your gastrointestinal care!    Tye Savoy, NP  _______________________________________________________________________
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
## 2021-07-02 ENCOUNTER — Other Ambulatory Visit (INDEPENDENT_AMBULATORY_CARE_PROVIDER_SITE_OTHER): Payer: Self-pay | Admitting: Specialist

## 2021-07-04 ENCOUNTER — Telehealth (INDEPENDENT_AMBULATORY_CARE_PROVIDER_SITE_OTHER): Payer: Self-pay

## 2021-07-04 ENCOUNTER — Telehealth (INDEPENDENT_AMBULATORY_CARE_PROVIDER_SITE_OTHER): Payer: Medicare Other | Admitting: Psychology

## 2021-07-04 DIAGNOSIS — F3289 Other specified depressive episodes: Secondary | ICD-10-CM | POA: Diagnosis not present

## 2021-07-04 NOTE — Telephone Encounter (Unsigned)
Pt called in and stated that he spoke to his GI this morning and they cleared him to start the [place]. He stated that they will put this information in his mychart. Please advise

## 2021-07-04 NOTE — Telephone Encounter (Signed)
Called patient and let him know that Ozempic can cause nausea , vomiting and upper abdominal pain and that it was okay if he takes it and see how it goes.

## 2021-07-04 NOTE — Telephone Encounter (Signed)
Last OV with Dawn 

## 2021-07-05 ENCOUNTER — Encounter (INDEPENDENT_AMBULATORY_CARE_PROVIDER_SITE_OTHER): Payer: Self-pay | Admitting: Family Medicine

## 2021-07-05 ENCOUNTER — Other Ambulatory Visit (INDEPENDENT_AMBULATORY_CARE_PROVIDER_SITE_OTHER): Payer: Self-pay | Admitting: Family Medicine

## 2021-07-05 ENCOUNTER — Telehealth: Payer: Self-pay | Admitting: Internal Medicine

## 2021-07-05 DIAGNOSIS — E1169 Type 2 diabetes mellitus with other specified complication: Secondary | ICD-10-CM

## 2021-07-05 DIAGNOSIS — Z89511 Acquired absence of right leg below knee: Secondary | ICD-10-CM

## 2021-07-05 MED ORDER — OZEMPIC (0.25 OR 0.5 MG/DOSE) 2 MG/1.5ML ~~LOC~~ SOPN
0.2500 mg | PEN_INJECTOR | SUBCUTANEOUS | 0 refills | Status: DC
Start: 2021-07-05 — End: 2021-08-09

## 2021-07-05 NOTE — Telephone Encounter (Signed)
Dawn 

## 2021-07-05 NOTE — Telephone Encounter (Signed)
Patient calling in  Says he needs new rx for new orthotic of left foot/shoe sent to [facility] [address] 's [place], [place] [address] [phone]

## 2021-07-05 NOTE — Progress Notes (Signed)
°  Office: [phone]  /  Fax: [phone]    Date: July 19, 2021   Appointment Start Time: 12:02pm Duration: 24 minutes Provider: Glennie Isle, Psy.D. Type of Session: Individual Therapy  Location of Patient: Home (private location) Location of Provider: Provider's Home (private office) Type of Contact: Telepsychological Visit via MyChart Video Visit  Session Content: Travis [name] is a 71 y.o. male presenting for a follow-up appointment to address the previously established treatment goal of increasing coping skills.Today's appointment was a telepsychological visit due to COVID-19. Travis [name] provided verbal consent for today's telepsychological appointment and he is aware he is responsible for securing confidentiality on his end of the session. Prior to proceeding with today's appointment, Travis [name]'s physical location at the time of this appointment was obtained as well a phone number he could be reached at in the event of technical difficulties. Travis [name] and this provider participated in today's telepsychological service.   This provider conducted a brief check-in. Travis [name] reported he lost 11 pounds but then gained 5 pounds. He described disappointment with the weight gain. Psychoeducation provided regarding not weighing himself regularly at home. He acknowledged he was weighing himself daily and agreed to reduce to once a week. Additionally, Travis [name] discussed challenges eating all food noted on his structured meal plan. Further explored and processed. Travis [name] acknowledged some emotional eating-related behaviors secondary to grief. Psychoeducation provided regarding grounding techniques. Engaged Travis [name] in a grounding exercise and his experience was processed. Travis [name] provided verbal consent during today's appointment for this provider to send a handout with today's exercise via e-mail. Overall, Travis [name] was receptive to today's appointment as evidenced by openness to sharing, responsiveness to  feedback, and willingness to engage in the grounding exercise.   Mental Status Examination:  Appearance: neat Behavior: appropriate to circumstances Mood: sad Affect: mood congruent Speech: WNL Eye Contact: appropriate Psychomotor Activity: WNL Gait: unable to assess Thought Process: linear, logical, and goal directed and denies suicidal, homicidal, and self-harm ideation, plan and intent  Thought Content/Perception: no hallucinations, delusions, bizarre thinking or behavior endorsed or observed Orientation: AAOx4 Memory/Concentration: memory, attention, language, and fund of knowledge intact  Insight: fair Judgment: fair  Interventions:  Conducted a brief chart review Provided empathic reflections and validation Employed supportive psychotherapy interventions to facilitate reduced distress and to improve coping skills with identified stressors Psychoeducation provided regarding grounding techniques Engaged pt in a grounding exercise Conducted a risk assessment   DSM-5 Diagnosis(es): F32.89 Other Specified Depressive Disorder, Emotional and Binge Eating Behaviors  Treatment Goal & Progress: During the initial appointment with this provider, the following treatment goal was established: increase coping skills. Progress is limited, as Travis [name] has just begun treatment with this provider; however, he is receptive to the interaction and interventions and rapport is being established.   Plan: The next appointment will be scheduled in two weeks, which will be via MyChart Video Visit. The next session will focus on working towards the established treatment goal. Travis [name] is scheduled for an initial appointment with Travis [name] on August 04, 2021.

## 2021-07-06 NOTE — Telephone Encounter (Signed)
Ok done hardcopy to [employer]

## 2021-07-07 DIAGNOSIS — G4733 Obstructive sleep apnea (adult) (pediatric): Secondary | ICD-10-CM | POA: Diagnosis not present

## 2021-07-07 DIAGNOSIS — Z96612 Presence of left artificial shoulder joint: Secondary | ICD-10-CM | POA: Diagnosis not present

## 2021-07-07 DIAGNOSIS — Z791 Long term (current) use of non-steroidal anti-inflammatories (NSAID): Secondary | ICD-10-CM | POA: Diagnosis not present

## 2021-07-07 DIAGNOSIS — E1151 Type 2 diabetes mellitus with diabetic peripheral angiopathy without gangrene: Secondary | ICD-10-CM | POA: Diagnosis not present

## 2021-07-07 DIAGNOSIS — I252 Old myocardial infarction: Secondary | ICD-10-CM | POA: Diagnosis not present

## 2021-07-07 DIAGNOSIS — Z7982 Long term (current) use of aspirin: Secondary | ICD-10-CM | POA: Diagnosis not present

## 2021-07-07 DIAGNOSIS — I509 Heart failure, unspecified: Secondary | ICD-10-CM | POA: Diagnosis not present

## 2021-07-07 DIAGNOSIS — E785 Hyperlipidemia, unspecified: Secondary | ICD-10-CM | POA: Diagnosis not present

## 2021-07-07 DIAGNOSIS — M4800 Spinal stenosis, site unspecified: Secondary | ICD-10-CM | POA: Diagnosis not present

## 2021-07-07 DIAGNOSIS — I2510 Atherosclerotic heart disease of native coronary artery without angina pectoris: Secondary | ICD-10-CM | POA: Diagnosis not present

## 2021-07-07 DIAGNOSIS — G8929 Other chronic pain: Secondary | ICD-10-CM | POA: Diagnosis not present

## 2021-07-07 DIAGNOSIS — Z89511 Acquired absence of right leg below knee: Secondary | ICD-10-CM | POA: Diagnosis not present

## 2021-07-07 DIAGNOSIS — K219 Gastro-esophageal reflux disease without esophagitis: Secondary | ICD-10-CM | POA: Diagnosis not present

## 2021-07-07 DIAGNOSIS — N200 Calculus of kidney: Secondary | ICD-10-CM | POA: Diagnosis not present

## 2021-07-07 DIAGNOSIS — Z96641 Presence of right artificial hip joint: Secondary | ICD-10-CM | POA: Diagnosis not present

## 2021-07-07 DIAGNOSIS — Z471 Aftercare following joint replacement surgery: Secondary | ICD-10-CM | POA: Diagnosis not present

## 2021-07-08 ENCOUNTER — Ambulatory Visit (HOSPITAL_COMMUNITY)
Admission: RE | Admit: 2021-07-08 | Discharge: 2021-07-08 | Disposition: A | Discharge status: Home / Self Care | Payer: Medicare Other | Source: Ambulatory Visit | Attending: Nurse Practitioner | Admitting: Nurse Practitioner

## 2021-07-08 ENCOUNTER — Other Ambulatory Visit: Payer: Self-pay

## 2021-07-08 ENCOUNTER — Telehealth: Payer: Self-pay | Admitting: Nurse Practitioner

## 2021-07-08 ENCOUNTER — Encounter (INDEPENDENT_AMBULATORY_CARE_PROVIDER_SITE_OTHER): Payer: Self-pay | Admitting: Family Medicine

## 2021-07-08 DIAGNOSIS — R1312 Dysphagia, oropharyngeal phase: Secondary | ICD-10-CM | POA: Insufficient documentation

## 2021-07-08 DIAGNOSIS — R1310 Dysphagia, unspecified: Secondary | ICD-10-CM | POA: Diagnosis not present

## 2021-07-08 DIAGNOSIS — R6339 Other feeding difficulties: Secondary | ICD-10-CM | POA: Diagnosis not present

## 2021-07-08 MED ORDER — ZENPEP 40000-126000 UNITS PO CPEP: ORAL_CAPSULE | ORAL | 11 refills | Status: DC

## 2021-07-08 NOTE — Telephone Encounter (Signed)
Inbound call from patient stating that he is in need of a Zenpep refill. Please advise.

## 2021-07-08 NOTE — Telephone Encounter (Signed)
Order faxed 07/06/21 and patient notified.

## 2021-07-08 NOTE — Telephone Encounter (Signed)
See last visit note. Medication refilled.

## 2021-07-08 NOTE — Progress Notes (Signed)
Modified Barium Swallow Progress Note  Completed and documented by Vaughan Sine SLP Student Supervised and reviewed by Herbie Baltimore Parker CCC-SLP   Patient Details  Name: Travis RHINE Sr. MRN: [id number] Date of Birth: 1950/07/21  Today's Date: 07/08/2021  Modified Barium Swallow completed.  Full report located under Chart Review in the Imaging Section.  Brief recommendations include the following:  Clinical Impression  Patient was seen for an outpatient MBS due to possible oropharyngeal dysphagia concerns. Oral mech exam revealed the patient had adequate labial/lingual/buccal strength and ROM. Bolus preparation and mastication included adequate rotary chewing, lateralization, and propulsion of novel bolus across consistencies and textures. Initiation of pharyngeal swallow sequence WNL. Epiglottic inversion and laryngeal vestibule closure present with all consistencies, except a mixed consistency of a barium tablet with thin liquid. Mixed pill and thin liquid consistency resulted in aspiration of thin liquids before the swallow d/t pt throwing head back, and aspirate was spontaneously and independently ejected out by the patient. SLP suggests patient place medications whole in puree to aid in swallow safety. SLP recommended GI/ENT consult for suspected prominent CP bar possibly impacting esophageal sphincter mechanism and possibly contributing to difficulty swallowing secretions.   Swallow Evaluation Recommendations   Recommended Consults: Consider ENT evaluation;Consider GI evaluation   SLP Diet Recommendations: Regular solids;Thin liquid   Liquid Administration via: Straw;Cup   Medication Administration: Crushed with puree   Supervision: Patient able to self feed   Compensations: Small sips/bites   Postural Changes: Seated upright at 90 degrees   Oral Care Recommendations: Oral care BID      Herbie Baltimore, MA [facility]  [phone]   Travis [name] 07/08/2021,12:02 PM

## 2021-07-11 DIAGNOSIS — N200 Calculus of kidney: Secondary | ICD-10-CM | POA: Diagnosis not present

## 2021-07-11 DIAGNOSIS — Z471 Aftercare following joint replacement surgery: Secondary | ICD-10-CM | POA: Diagnosis not present

## 2021-07-11 DIAGNOSIS — Z96641 Presence of right artificial hip joint: Secondary | ICD-10-CM | POA: Diagnosis not present

## 2021-07-11 DIAGNOSIS — G894 Chronic pain syndrome: Secondary | ICD-10-CM | POA: Diagnosis not present

## 2021-07-11 DIAGNOSIS — G4733 Obstructive sleep apnea (adult) (pediatric): Secondary | ICD-10-CM | POA: Diagnosis not present

## 2021-07-11 DIAGNOSIS — I252 Old myocardial infarction: Secondary | ICD-10-CM | POA: Diagnosis not present

## 2021-07-11 DIAGNOSIS — Z89511 Acquired absence of right leg below knee: Secondary | ICD-10-CM | POA: Diagnosis not present

## 2021-07-11 DIAGNOSIS — M4800 Spinal stenosis, site unspecified: Secondary | ICD-10-CM | POA: Diagnosis not present

## 2021-07-11 DIAGNOSIS — Z791 Long term (current) use of non-steroidal anti-inflammatories (NSAID): Secondary | ICD-10-CM | POA: Diagnosis not present

## 2021-07-11 DIAGNOSIS — K219 Gastro-esophageal reflux disease without esophagitis: Secondary | ICD-10-CM | POA: Diagnosis not present

## 2021-07-11 DIAGNOSIS — M961 Postlaminectomy syndrome, not elsewhere classified: Secondary | ICD-10-CM | POA: Diagnosis not present

## 2021-07-11 DIAGNOSIS — I2510 Atherosclerotic heart disease of native coronary artery without angina pectoris: Secondary | ICD-10-CM | POA: Diagnosis not present

## 2021-07-11 DIAGNOSIS — E785 Hyperlipidemia, unspecified: Secondary | ICD-10-CM | POA: Diagnosis not present

## 2021-07-11 DIAGNOSIS — Z7982 Long term (current) use of aspirin: Secondary | ICD-10-CM | POA: Diagnosis not present

## 2021-07-11 DIAGNOSIS — G8929 Other chronic pain: Secondary | ICD-10-CM | POA: Diagnosis not present

## 2021-07-11 DIAGNOSIS — I509 Heart failure, unspecified: Secondary | ICD-10-CM | POA: Diagnosis not present

## 2021-07-11 DIAGNOSIS — Z79899 Other long term (current) drug therapy: Secondary | ICD-10-CM | POA: Diagnosis not present

## 2021-07-11 DIAGNOSIS — M7918 Myalgia, other site: Secondary | ICD-10-CM | POA: Diagnosis not present

## 2021-07-11 DIAGNOSIS — Z96612 Presence of left artificial shoulder joint: Secondary | ICD-10-CM | POA: Diagnosis not present

## 2021-07-11 DIAGNOSIS — E1151 Type 2 diabetes mellitus with diabetic peripheral angiopathy without gangrene: Secondary | ICD-10-CM | POA: Diagnosis not present

## 2021-07-13 ENCOUNTER — Telehealth: Payer: Self-pay | Admitting: Gastroenterology

## 2021-07-13 NOTE — Telephone Encounter (Signed)
What is the maximum daily dose for this patient? Three meals and one snack a day will make the daily dose 280,000 units of Zenpep. He weighs 282 lbs. Thanks

## 2021-07-13 NOTE — Telephone Encounter (Signed)
Inbound call from pharmacy, seeking advice as to how many snacks patient is supposed to have with Zenpep. Please advise.

## 2021-07-14 ENCOUNTER — Telehealth: Payer: Self-pay

## 2021-07-14 ENCOUNTER — Ambulatory Visit (INDEPENDENT_AMBULATORY_CARE_PROVIDER_SITE_OTHER): Payer: Medicare Other | Admitting: Family Medicine

## 2021-07-14 ENCOUNTER — Encounter (INDEPENDENT_AMBULATORY_CARE_PROVIDER_SITE_OTHER): Payer: Self-pay | Admitting: Family Medicine

## 2021-07-14 ENCOUNTER — Other Ambulatory Visit: Payer: Self-pay

## 2021-07-14 VITALS — BP 148/76 | HR 85 | Temp 98.9°F | Ht 70.0 in | Wt 270.0 lb

## 2021-07-14 DIAGNOSIS — I152 Hypertension secondary to endocrine disorders: Secondary | ICD-10-CM | POA: Diagnosis not present

## 2021-07-14 DIAGNOSIS — E669 Obesity, unspecified: Secondary | ICD-10-CM | POA: Diagnosis not present

## 2021-07-14 DIAGNOSIS — E1165 Type 2 diabetes mellitus with hyperglycemia: Secondary | ICD-10-CM | POA: Diagnosis not present

## 2021-07-14 DIAGNOSIS — Z7985 Long-term (current) use of injectable non-insulin antidiabetic drugs: Secondary | ICD-10-CM | POA: Diagnosis not present

## 2021-07-14 DIAGNOSIS — E1159 Type 2 diabetes mellitus with other circulatory complications: Secondary | ICD-10-CM | POA: Diagnosis not present

## 2021-07-14 DIAGNOSIS — Z6841 Body Mass Index (BMI) 40.0 and over, adult: Secondary | ICD-10-CM

## 2021-07-14 NOTE — Telephone Encounter (Signed)
Is he eligible for an [facility] procedure?  Did you see my message from the pharmacy about the Zenpep?

## 2021-07-14 NOTE — Progress Notes (Signed)
Chief Complaint:   OBESITY Travis [name] is here to discuss his progress with his obesity treatment plan along with follow-up of his obesity related diagnoses. Travis [name] is on the Category 4 Plan and states he is following his eating plan approximately 80% of the time. Travis [name] states he is using 40 lb weights 60 minutes 3 times per week.  Today's visit was #: 6 Starting weight: 302 lbs Starting date: 04/06/2021 Today's weight: 270 lbs Today's date: 07/14/2021 Total lbs lost to date: 32 Total lbs lost since last in-office visit: 11  Interim History: Pt has been going to [employer] and lifting weights. He is trying to stay focused, is weighing food and staying on  plan. The only thing pt doesn't care for is the 1 choice for breakfast. He has no upcoming activities or plans.  Subjective:   1. Type 2 diabetes mellitus with hyperglycemia, without long-term current use of insulin (HCC) Pt is on Ozempic and notes some nausea but tolerable.  2. Hypertension associated with diabetes ([place]) BP previously well controlled. Travis [name] denies chest pain/chest pressure/headache.  Assessment/Plan:   1. Type 2 diabetes mellitus with hyperglycemia, without long-term current use of insulin (HCC) Good blood sugar control is important to decrease the likelihood of diabetic complications such as nephropathy, neuropathy, limb loss, blindness, coronary artery disease, and death. Intensive lifestyle modification including diet, exercise and weight loss are the first line of treatment for diabetes. Continue Ozempic 0.25 mg. No increase in dose due to some nausea.  2. Hypertension associated with diabetes ([place]) Travis [name] is working on healthy weight loss and exercise to improve blood pressure control. We will watch for signs of hypotension as he continues his lifestyle modifications. Continue current meds with no change in dose. F/u on BP at next appt.  3. Obesity with current BMI of 40.32 Travis [name] is currently in  the action stage of change. As such, his goal is to continue with weight loss efforts. He has agreed to the Category 4 Plan.   Exercise goals: All adults should avoid inactivity. Some physical activity is better than none, and adults who participate in any amount of physical activity gain some health benefits.  Behavioral modification strategies: increasing lean protein intake, meal planning and cooking strategies, keeping healthy foods in the home, and planning for success.  Travis [name] has agreed to follow-up with our clinic in 3 weeks. He was informed of the importance of frequent follow-up visits to maximize his success with intensive lifestyle modifications for his multiple health conditions.   Objective:   Blood pressure (!) 148/76, pulse 85, temperature 98.9 F (37.2 C), height 5' 10"  (1.778 m), weight 270 lb (122.5 kg), SpO2 94 %. Body mass index is 38.74 kg/m.  General: Cooperative, alert, well developed, in no acute distress. HEENT: Conjunctivae and lids unremarkable. Cardiovascular: Regular rhythm.  Lungs: Normal work of breathing. Neurologic: No focal deficits.   Lab Results  Component Value Date   CREATININE 0.89 06/14/2021   BUN 14 06/14/2021   NA 139 06/14/2021   K 4.0 06/14/2021   CL 103 06/14/2021   CO2 28 06/14/2021   Lab Results  Component Value Date   ALT 11 06/14/2021   AST 17 06/14/2021   ALKPHOS 58 06/14/2021   BILITOT 0.3 06/14/2021   Lab Results  Component Value Date   HGBA1C 5.5 06/14/2021   HGBA1C 5.6 04/11/2021   HGBA1C 5.8 (H) 03/09/2021   HGBA1C 6.1 12/01/2020   HGBA1C 5.7 (A) 02/24/2020   Lab Results  Component Value Date   INSULIN 12.2 04/11/2021   Lab Results  Component Value Date   TSH 2.63 06/14/2021   Lab Results  Component Value Date   CHOL 129 06/14/2021   HDL 48.10 06/14/2021   LDLCALC 60 06/14/2021   TRIG 104.0 06/14/2021   CHOLHDL 3 06/14/2021   Lab Results  Component Value Date   VD25OH 40.99 06/14/2021   VD25OH  51.0 04/11/2021   VD25OH 53.07 12/01/2020   Lab Results  Component Value Date   WBC 3.7 (L) 06/14/2021   HGB 12.3 (L) 06/14/2021   HCT 37.6 (L) 06/14/2021   MCV 88.1 06/14/2021   PLT 219.0 06/14/2021   Lab Results  Component Value Date   IRON 49 12/01/2020   TIBC 186 (L) 10/18/2014   FERRITIN 25.6 12/01/2020    Attestation Statements:   Reviewed by clinician on day of visit: allergies, medications, problem list, medical history, surgical history, family history, social history, and previous encounter notes.  Coral Ceo, CMA, am acting as transcriptionist for Coralie Common, MD.  I have reviewed the above documentation for accuracy and completeness, and I agree with the above. - Coralie Common, MD

## 2021-07-14 NOTE — Telephone Encounter (Signed)
-----   Message from Willia Craze, NP sent at 07/14/2021  3:17 PM EST ----- Regarding: FW: thoughts Beth, will call Mr. Dettman. There may be some narrowing in his upper esophagus. This is a little confusing as he came to see me with problems swallowing saliva which is said was same issue as before we dilated his esophagus. He said the dilation did help. HOWEVER, reviewing Colleen's note prior to his dilation he was describing solid food dysphagia so I can see why the dilation helped.  Since his problem now is only with swallowing saliva I cannot be sure that this is same issue as prior and if dilation will help. However he does have the possible upper esophageal sphincter findings on barium swallow so dilation may help. If he would like to proceed with another EGD with dilation then please arrange this to be done with Dr. Havery Moros. Thanks ----- Message ----- From: Yetta Flock, MD Sent: 07/08/2021   5:43 PM EST To: Willia Craze, NP Subject: RE: thoughts                                   Thanks Nevin Bloodgood. I dilated him I think < 2 years ago empirically. If he thinks that helped him for some time, then yeah I think doing an EGD with another dilation would be fine. Up to him ultimately either way, if his symptoms are really bothering him we can do it. If similar symptoms to previous however and he didn't think the dilation helped we can hold off.   Richardson Landry   ----- Message ----- From: Willia Craze, NP Sent: 07/08/2021  12:37 PM EST To: Yetta Flock, MD Subject: thoughts                                       Question for you, I saw this man in clinic for swallowing problems which to me sound more oropharyngeal. Seen by you in past for dysphagia but that was more esophageal in nature and I don't think subsequent EGD showed anything. Anyway, got MBSS, see below.   FINDINGS: C3-4 anterior spine fixation.  With thin liquids, premature spill.  Flash penetration.  With purees  thickness, no swallow dysfunction identified.  A 13 mm barium tablet passes.   SLP raised concern for cricopharyngeal bar.  Do you think it is worth an EGD to dilate upper esoph sphincter?     Thanks

## 2021-07-16 ENCOUNTER — Other Ambulatory Visit (INDEPENDENT_AMBULATORY_CARE_PROVIDER_SITE_OTHER): Payer: Self-pay | Admitting: Family Medicine

## 2021-07-16 DIAGNOSIS — E1169 Type 2 diabetes mellitus with other specified complication: Secondary | ICD-10-CM

## 2021-07-18 NOTE — Telephone Encounter (Signed)
Spoke with the patient. He will see if he can find a care partner and call back to schedule an EGD in the [place].

## 2021-07-19 ENCOUNTER — Telehealth (INDEPENDENT_AMBULATORY_CARE_PROVIDER_SITE_OTHER): Payer: Medicare Other | Admitting: Psychology

## 2021-07-19 DIAGNOSIS — F3289 Other specified depressive episodes: Secondary | ICD-10-CM | POA: Diagnosis not present

## 2021-07-19 NOTE — Progress Notes (Unsigned)
°  Office: [phone]  /  Fax: [phone]    Date: 08/02/2021   Appointment Start Time: *** Duration: *** minutes Provider: Glennie Isle, Psy.D. Type of Session: Individual Therapy  Location of Patient: {gbptloc:23249} Location of Provider: Provider's Home (private office) Type of Contact: Telepsychological Visit via MyChart Video Visit  Session Content: Ermin is a 71 y.o. male presenting for a follow-up appointment to address the previously established treatment goal of increasing coping skills.Today's appointment was a telepsychological visit due to COVID-19. Nadir provided verbal consent for today's telepsychological appointment and he is aware he is responsible for securing confidentiality on his end of the session. Prior to proceeding with today's appointment, Jobin's physical location at the time of this appointment was obtained as well a phone number he could be reached at in the event of technical difficulties. Lennis and this provider participated in today's telepsychological service.   This provider conducted a brief check-in. *** Knowledge was receptive to today's appointment as evidenced by openness to sharing, responsiveness to feedback, and {gbreceptiveness:23401}.  Mental Status Examination:  Appearance: {Appearance:22431} Behavior: {Behavior:22445} Mood: {gbmood:21757} Affect: {Affect:22436} Speech: {Speech:22432} Eye Contact: {Eye Contact:22433} Psychomotor Activity: {Motor Activity:22434} Gait: {gbgait:23404} Thought Process: {thought process:22448}  Thought Content/Perception: {disturbances:22451} Orientation: {Orientation:22437} Memory/Concentration: {gbcognition:22449} Insight: {Insight:22446} Judgment: {Insight:22446}  Interventions:  {Interventions for Progress Notes:23405}  DSM-5 Diagnosis(es): F32.89 Other Specified Depressive Disorder, Emotional and Binge Eating Behaviors  Treatment Goal & Progress: During the initial appointment with this  provider, the following treatment goal was established: increase coping skills. Masaru has demonstrated progress in his goal as evidenced by {gbtxprogress:22839}. Jaedin also {gbtxprogress2:22951}.  Plan: The next appointment will be scheduled in {gbweeks:21758}, which will be {gbtxmodality:23402}. The next session will focus on {Plan for Next Appointment:23400}.

## 2021-07-20 ENCOUNTER — Other Ambulatory Visit: Payer: Self-pay | Admitting: Internal Medicine

## 2021-07-21 ENCOUNTER — Other Ambulatory Visit: Payer: Self-pay

## 2021-07-21 MED ORDER — ZENPEP 40000-126000 UNITS PO CPEP: ORAL_CAPSULE | ORAL | 11 refills | Status: DC

## 2021-07-26 DIAGNOSIS — M4326 Fusion of spine, lumbar region: Secondary | ICD-10-CM | POA: Diagnosis not present

## 2021-07-26 DIAGNOSIS — M4726 Other spondylosis with radiculopathy, lumbar region: Secondary | ICD-10-CM | POA: Diagnosis not present

## 2021-07-26 DIAGNOSIS — M48061 Spinal stenosis, lumbar region without neurogenic claudication: Secondary | ICD-10-CM | POA: Diagnosis not present

## 2021-07-29 ENCOUNTER — Other Ambulatory Visit: Payer: Self-pay | Admitting: Cardiovascular Disease

## 2021-07-29 DIAGNOSIS — E785 Hyperlipidemia, unspecified: Secondary | ICD-10-CM

## 2021-07-29 DIAGNOSIS — J9620 Acute and chronic respiratory failure, unspecified whether with hypoxia or hypercapnia: Secondary | ICD-10-CM | POA: Diagnosis not present

## 2021-07-29 DIAGNOSIS — R0600 Dyspnea, unspecified: Secondary | ICD-10-CM | POA: Diagnosis not present

## 2021-08-02 ENCOUNTER — Other Ambulatory Visit: Payer: Self-pay

## 2021-08-02 ENCOUNTER — Telehealth (INDEPENDENT_AMBULATORY_CARE_PROVIDER_SITE_OTHER): Payer: Medicare Other | Admitting: Psychology

## 2021-08-02 DIAGNOSIS — F3289 Other specified depressive episodes: Secondary | ICD-10-CM | POA: Diagnosis not present

## 2021-08-02 MED ORDER — HYDROCHLOROTHIAZIDE 25 MG PO TABS: 25.0000 mg | ORAL_TABLET | Freq: Every day | ORAL | 1 refills | Status: DC

## 2021-08-02 NOTE — Progress Notes (Signed)
?  Office: [phone]  /  Fax: [phone] ? ? ? ?Date: 08/16/2021   ?Appointment Start Time: 12:00pm ?Duration: 18 minutes ?Provider: Glennie Isle, Psy.D. ?Type of Session: Individual Therapy  ?Location of Patient: Home (private location) ?Location of Provider: Provider's Home (private office) ?Type of Contact: Telepsychological Visit via MyChart Video Visit ? ?Session Content: Travis [name] is a 71 y.o. male presenting for a follow-up appointment to address the previously established treatment goal of increasing coping skills.Today's appointment was a telepsychological visit due to COVID-19. Travis [name] provided verbal consent for today's telepsychological appointment and he is aware he is responsible for securing confidentiality on his end of the session. Prior to proceeding with today's appointment, Travis [name]'s physical location at the time of this appointment was obtained as well a phone number he could be reached at in the event of technical difficulties. Travis [name] and this provider participated in today's telepsychological service.  ? ?This provider conducted a brief check-in. Travis [name] discussed his surgery was approved by his insurance, noting, "That's a plus." He further shared, "My Medicaid is restored." Additionally, Travis [name] reported he met with his primary therapist (Dr. Michail Sermon), noting their next appointment is August 25, 2021. He also discussed a plan to see his family in May. Session focused further on thought defusion to assist with coping. He reported using shared exercises, adding it has helped with his eating habits. Positive reinforcement was provided. Travis [name] was led through a thought defusion exercise (Leaves on a Stream) and his experience was processed. Travis [name] provided verbal consent during today's appointment for this provider to send the handout and YouTube link for today's exercise via e-mail. Overall, Travis [name] was receptive to today's appointment as evidenced by openness to sharing, responsiveness to  feedback, and willingness to continue engaging in thought defusion exercises. ? ?Mental Status Examination:  ?Appearance: neat ?Behavior: appropriate to circumstances ?Mood: neutral ?Affect: mood congruent ?Speech: WNL ?Eye Contact: appropriate ?Psychomotor Activity: WNL ?Gait: unable to assess ?Thought Process: linear, logical, and goal directed and denies suicidal, homicidal, and self-harm ideation, plan and intent  ?Thought Content/Perception: no hallucinations, delusions, bizarre thinking or behavior endorsed or observed ?Orientation: AAOx4 ?Memory/Concentration: memory, attention, language, and fund of knowledge intact  ?Insight: fair ?Judgment: fair ? ?Interventions:  ?Conducted a brief chart review ?Conducted a risk assessment ?Provided empathic reflections and validation ?Reviewed content from the previous session ?Employed supportive psychotherapy interventions to facilitate reduced distress and to improve coping skills with identified stressors ?Engaged patient in thought defusion exercise(s) ?Provided positive reinforcement  ? ?DSM-5 Diagnosis(es): F50.89 Other Specified Feeding or Eating Disorder, Emotional and Binge Eating Behaviors ? ?Treatment Goal & Progress: During the initial appointment with this provider, the following treatment goal was established: increase coping skills. Travis [name] has demonstrated progress in his goal as evidenced by increased awareness of hunger patterns. Travis [name] also continues to demonstrate willingness to engage in learned skill(s). ? ?Plan: The next appointment will be scheduled in two weeks, which will be via MyChart Video Visit. The next session will focus on working towards the established treatment goal. Travis [name] will continue meeting with his primary therapist.  ? ?

## 2021-08-04 ENCOUNTER — Ambulatory Visit (INDEPENDENT_AMBULATORY_CARE_PROVIDER_SITE_OTHER): Payer: Medicare Other | Admitting: Psychologist

## 2021-08-04 DIAGNOSIS — Z634 Disappearance and death of family member: Secondary | ICD-10-CM

## 2021-08-04 DIAGNOSIS — F331 Major depressive disorder, recurrent, moderate: Secondary | ICD-10-CM

## 2021-08-04 NOTE — Progress Notes (Signed)
                Travis Sauve, PsyD 
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
## 2021-08-04 NOTE — Plan of Care (Signed)
Goals °Alleviate depressive symptoms °Recognize, accept, and cope with depressive feelings °Develop healthy thinking patterns °Develop healthy interpersonal relationships ° °Objectives °Cooperate with a medication evaluation by a physician °Verbalize an accurate understanding of depression °Verbalize an understanding of the treatment °Identify and replace thoughts that support depression °Learn and implement behavioral strategies °Verbalize an understanding and resolution of current interpersonal problems °Learn and implement problem solving and decision making skills °Learn and implement conflict resolution skills to resolve interpersonal problems °Verbalize an understanding of healthy and unhealthy emotions verbalize insight into how past relationships may be influence current experiences with depression °Use mindfulness and acceptance strategies and increase value based behavior  °Increase hopeful statements about the future.  °Interventions °Consistent with treatment model, discuss how change in cognitive, behavioral, and interpersonal can help client alleviate depression °CBT °Behavioral activation help the client explore the relationship, nature of the dispute,  °Help the client develop new interpersonal skills and relationships °Conduct Problem so living therapy °Teach conflict resolution skills °Use a process-experiential approach °Conduct TLDP °Conduct ACT °Evaluate need for psychotropic medication °Monitor adherence to medication  ° °Goals °Begin a healthy grieving process °Objectives °Tell in detail the story of the current loss that is triggering symptoms °Read books on the topic of grief °Watch videos on the theme of grief °Begin verbalizing feelings associated with the loss °Attend a grief support group °express thoughts and feelings about the deceased °Identify and voice positives about the deceased °implement acts of spiritual faith  °Interventions °create a safe environment and actively build  trust °use empathy, compassion, and support °ask the patient to write a letter to the lost person °conduct empty chair °ask the patient to discuss and list the positives and negative aspects of the person °encourage patient to rely upon his/her spiritual faith  °ask client to read books on grief °ask patient to watch videos about grief °assist patient in identifying emotions  °ask patient to attend support group  ° °

## 2021-08-05 ENCOUNTER — Telehealth: Payer: Self-pay | Admitting: Internal Medicine

## 2021-08-05 ENCOUNTER — Other Ambulatory Visit (INDEPENDENT_AMBULATORY_CARE_PROVIDER_SITE_OTHER): Payer: Self-pay | Admitting: Family Medicine

## 2021-08-05 DIAGNOSIS — E1169 Type 2 diabetes mellitus with other specified complication: Secondary | ICD-10-CM

## 2021-08-05 MED ORDER — METHOCARBAMOL 500 MG PO TABS: 500.0000 mg | ORAL_TABLET | Freq: Three times a day (TID) | ORAL | 0 refills | Status: DC | PRN

## 2021-08-05 NOTE — Telephone Encounter (Signed)
1.Medication Requested: methocarbamol (ROBAXIN) 500 MG tablet ? ?2. Pharmacy (Name, Street, [place]): [facility] [place], [place]  ?Phone:  [phone] ?Fax:  [phone] ? ? ?3. On Med List: yes ? ?4. Last Visit with PCP: 01.31.23 ? ?5. Next visit date with PCP: [place], [place]  ?Phone:  [phone] ?Fax:  [phone] ? ? ? ?Agent: Please be advised that RX refills may take up to 3 business days. We ask that you follow-up with your pharmacy.  ?

## 2021-08-05 NOTE — Telephone Encounter (Signed)
Prescription sent to pharmacy.

## 2021-08-08 ENCOUNTER — Other Ambulatory Visit (INDEPENDENT_AMBULATORY_CARE_PROVIDER_SITE_OTHER): Payer: Self-pay | Admitting: Specialist

## 2021-08-08 NOTE — Telephone Encounter (Signed)
FYI

## 2021-08-09 ENCOUNTER — Encounter (INDEPENDENT_AMBULATORY_CARE_PROVIDER_SITE_OTHER): Payer: Self-pay | Admitting: Family Medicine

## 2021-08-09 ENCOUNTER — Ambulatory Visit (INDEPENDENT_AMBULATORY_CARE_PROVIDER_SITE_OTHER): Payer: Medicare Other | Admitting: Family Medicine

## 2021-08-09 ENCOUNTER — Other Ambulatory Visit: Payer: Self-pay

## 2021-08-09 VITALS — BP 105/61 | HR 70 | Temp 98.2°F | Ht 70.0 in | Wt 272.0 lb

## 2021-08-09 DIAGNOSIS — Z6839 Body mass index (BMI) 39.0-39.9, adult: Secondary | ICD-10-CM

## 2021-08-09 DIAGNOSIS — E669 Obesity, unspecified: Secondary | ICD-10-CM

## 2021-08-09 DIAGNOSIS — E1165 Type 2 diabetes mellitus with hyperglycemia: Secondary | ICD-10-CM | POA: Diagnosis not present

## 2021-08-09 DIAGNOSIS — Z7985 Long-term (current) use of injectable non-insulin antidiabetic drugs: Secondary | ICD-10-CM

## 2021-08-09 DIAGNOSIS — I152 Hypertension secondary to endocrine disorders: Secondary | ICD-10-CM

## 2021-08-09 DIAGNOSIS — E1159 Type 2 diabetes mellitus with other circulatory complications: Secondary | ICD-10-CM

## 2021-08-09 DIAGNOSIS — Z6841 Body Mass Index (BMI) 40.0 and over, adult: Secondary | ICD-10-CM

## 2021-08-09 MED ORDER — OZEMPIC (0.25 OR 0.5 MG/DOSE) 2 MG/1.5ML ~~LOC~~ SOPN: 0.2500 mg | PEN_INJECTOR | SUBCUTANEOUS | 0 refills | Status: DC

## 2021-08-10 ENCOUNTER — Other Ambulatory Visit: Payer: Self-pay | Admitting: Internal Medicine

## 2021-08-10 NOTE — Progress Notes (Signed)
? ? ? ?Chief Complaint:  ? ?OBESITY ?Travis [name] is here to discuss his progress with his obesity treatment plan along with follow-up of his obesity related diagnoses. Travis [name] is on the Category 4 Plan and states he is following his eating plan approximately 50% of the time. Travis [name] states he is lifting weights for 60 minutes 3 times per week. ? ?Today's visit was #: 7 ?Starting weight: 302 lbs ?Starting date: 04/06/2021 ?Today's weight: 272 lbs ?Today's date:08/09/2021 ?Total lbs lost to date: 30 lbs ?Total lbs lost since last in-office visit: 0 ? ?Interim History: Travis [name] is planning for Sacroiliac Joint Fusion. He has no date for surgery yet. The idea of another surgery led to him feeling more depressed. He realizes he got off track with meal plan earlier due to mental health changes. He ran out of Ozempic. He has gotten back on meal plan in last few days.  ? ?Subjective:  ? ?1. Type 2 diabetes mellitus with hyperglycemia, without long-term current use of insulin (Travis [name]) ?Travis [name] is doing well on Ozempic 0.25 mg. He ran out. He will need an appointment.  ? ?2. Hypertension associated with diabetes (Travis [name]) ?Travis [name] blood pressure is well controlled today. His blood pressure today was 105/61. He denies chest pain, chest pressure, and headaches.  ? ?Assessment/Plan:  ? ?1. Type 2 diabetes mellitus with hyperglycemia, without long-term current use of insulin ([place]) ?We will refill Ozempiic 0.25 mg weekly subcutaneous for 1 month with no refill. Good blood sugar control is important to decrease the likelihood of diabetic complications such as nephropathy, neuropathy, limb loss, blindness, coronary artery disease, and death. Intensive lifestyle modification including diet, exercise and weight loss are the first line of treatment for diabetes.  ? ?- Semaglutide,0.25 or 0.5MG/DOS, (OZEMPIC, 0.25 OR 0.5 MG/DOSE,) 2 MG/1.5ML SOPN; Inject 0.25 mg into the skin once a week.  Dispense: 1.5 mL; Refill: 0 ? ?2. Hypertension associated  with diabetes ([place]) ?[place] will continue taking his medications and we will follow up at next appointment. He is working on healthy weight loss and exercise to improve blood pressure control. We will watch for signs of hypotension as he continues his lifestyle modifications. ? ?3. Obesity with current BMI of 39.03 ?[place] is currently in the action stage of change. As such, his goal is to continue with weight loss efforts. He has agreed to the Category 4 Plan.  ? ?Exercise goals:  As is. ? ?Behavioral modification strategies: increasing lean protein intake. ? ?[place] has agreed to follow-up with our clinic in 3 weeks. He was informed of the importance of frequent follow-up visits to maximize his success with intensive lifestyle modifications for his multiple health conditions.  ? ?Objective:  ? ?Blood pressure 105/61, pulse 70, temperature 98.2 ?F (36.8 ?C), height 5' 10"  (1.778 m), weight 272 lb (123.4 kg), SpO2 95 %. ?Body mass index is 39.03 kg/m?. ? ?General: Cooperative, alert, well developed, in no acute distress. ?HEENT: Conjunctivae and lids unremarkable. ?Cardiovascular: Regular rhythm.  ?Lungs: Normal work of breathing. ?Neurologic: No focal deficits.  ? ?Lab Results  ?Component Value Date  ? CREATININE 0.89 06/14/2021  ? BUN 14 06/14/2021  ? NA 139 06/14/2021  ? K 4.0 06/14/2021  ? CL 103 06/14/2021  ? CO2 28 06/14/2021  ? ?Lab Results  ?Component Value Date  ? ALT 11 06/14/2021  ? AST 17 06/14/2021  ? ALKPHOS 58 06/14/2021  ? BILITOT 0.3 06/14/2021  ? ?Lab Results  ?Component Value Date  ? HGBA1C 5.5 06/14/2021  ?  HGBA1C 5.6 04/11/2021  ? HGBA1C 5.8 (H) 03/09/2021  ? HGBA1C 6.1 12/01/2020  ? HGBA1C 5.7 (A) 02/24/2020  ? ?Lab Results  ?Component Value Date  ? INSULIN 12.2 04/11/2021  ? ?Lab Results  ?Component Value Date  ? TSH 2.63 06/14/2021  ? ?Lab Results  ?Component Value Date  ? CHOL 129 06/14/2021  ? HDL 48.10 06/14/2021  ? [place] 60 06/14/2021  ? TRIG 104.0 06/14/2021  ? CHOLHDL 3 06/14/2021   ? ?Lab Results  ?Component Value Date  ? VD25OH 40.99 06/14/2021  ? VD25OH 51.0 04/11/2021  ? VD25OH 53.07 12/01/2020  ? ?Lab Results  ?Component Value Date  ? WBC 3.7 (L) 06/14/2021  ? HGB 12.3 (L) 06/14/2021  ? HCT 37.6 (L) 06/14/2021  ? MCV 88.1 06/14/2021  ? PLT 219.0 06/14/2021  ? ?Lab Results  ?Component Value Date  ? IRON 49 12/01/2020  ? TIBC 186 (L) 10/18/2014  ? FERRITIN 25.6 12/01/2020  ? ?Attestation Statements:  ? ?Reviewed by clinician on day of visit: allergies, medications, problem list, medical history, surgical history, family history, social history, and previous encounter notes. ? ?Travis [name], Travis [name], RMA, am acting as transcriptionist for Coralie Common, MD. ? ?Travis [name] have reviewed the above documentation for accuracy and completeness, and Travis [name] agree with the above. Coralie Common, MD ? ?

## 2021-08-11 ENCOUNTER — Encounter (INDEPENDENT_AMBULATORY_CARE_PROVIDER_SITE_OTHER): Payer: Self-pay | Admitting: Family Medicine

## 2021-08-11 ENCOUNTER — Other Ambulatory Visit (INDEPENDENT_AMBULATORY_CARE_PROVIDER_SITE_OTHER): Payer: Self-pay

## 2021-08-11 DIAGNOSIS — E1165 Type 2 diabetes mellitus with hyperglycemia: Secondary | ICD-10-CM

## 2021-08-11 MED ORDER — OZEMPIC (0.25 OR 0.5 MG/DOSE) 2 MG/1.5ML ~~LOC~~ SOPN: 0.5000 mg | PEN_INJECTOR | SUBCUTANEOUS | 0 refills | Status: DC

## 2021-08-11 NOTE — Telephone Encounter (Signed)
[place] ?

## 2021-08-14 DIAGNOSIS — I10 Essential (primary) hypertension: Secondary | ICD-10-CM | POA: Diagnosis not present

## 2021-08-14 DIAGNOSIS — R569 Unspecified convulsions: Secondary | ICD-10-CM | POA: Diagnosis not present

## 2021-08-14 DIAGNOSIS — R404 Transient alteration of awareness: Secondary | ICD-10-CM | POA: Diagnosis not present

## 2021-08-14 DIAGNOSIS — R4020 Unspecified coma: Secondary | ICD-10-CM | POA: Diagnosis not present

## 2021-08-16 ENCOUNTER — Telehealth (INDEPENDENT_AMBULATORY_CARE_PROVIDER_SITE_OTHER): Payer: Medicare Other | Admitting: Psychology

## 2021-08-16 ENCOUNTER — Ambulatory Visit (INDEPENDENT_AMBULATORY_CARE_PROVIDER_SITE_OTHER): Payer: Medicare Other

## 2021-08-16 DIAGNOSIS — E559 Vitamin D deficiency, unspecified: Secondary | ICD-10-CM

## 2021-08-16 DIAGNOSIS — F320 Major depressive disorder, single episode, mild: Secondary | ICD-10-CM

## 2021-08-16 DIAGNOSIS — I2510 Atherosclerotic heart disease of native coronary artery without angina pectoris: Secondary | ICD-10-CM

## 2021-08-16 DIAGNOSIS — F3289 Other specified depressive episodes: Secondary | ICD-10-CM

## 2021-08-16 DIAGNOSIS — I10 Essential (primary) hypertension: Secondary | ICD-10-CM

## 2021-08-16 NOTE — Patient Instructions (Signed)
Visit Information ? ?Following are the goals we discussed today:  ? ?Track and Manage My Blood Pressure  ? ?Timeframe:  Long-Range Goal ?Priority:  High ?Start Date:   01/14/2021                          ?Expected End Date:  04/16/2022                    ? ?Follow Up Date September 2023 ?  ?- check blood pressure daily ?- choose a place to take my blood pressure (home, clinic or office, retail store) ?- write blood pressure results in a log or diary  ?  ?Why is this important?   ?You won't feel high blood pressure, but it can still hurt your blood vessels.  ?High blood pressure can cause heart or kidney problems. It can also cause a stroke.  ?Making lifestyle changes like losing a little weight or eating less salt will help.  ?Checking your blood pressure at home and at different times of the day can help to control blood pressure.  ?If the doctor prescribes medicine remember to take it the way the doctor ordered.  ?Call the office if you cannot afford the medicine or if there are questions about it.  ? ?Plan: Telephone follow up appointment with care management team member scheduled for:  6 months ?The patient has been provided with contact information for the care management team and has been advised to call with any health related questions or concerns.  ? ?Tomasa Blase, PharmD ?Clinical Pharmacist, [place]  ? ?Please call the care guide team at [phone] if you need to cancel or reschedule your appointment.  ? ?Patient verbalizes understanding of instructions and care plan provided today and agrees to view in [place]. Active MyChart status confirmed with patient.   ? ?

## 2021-08-16 NOTE — Progress Notes (Signed)
?Office: [phone]  /  Fax: [phone] ? ? ? ?Date: August 30, 2021   ?Appointment Start Time: 12:01pm ?Duration: 25 minutes ?Provider: Glennie Isle, Psy.D. ?Type of Session: Individual Therapy  ?Location of Patient: Home (private location) ?Location of Provider: Provider's Home (private office) ?Type of Contact: Telepsychological Visit via MyChart Video Visit ? ?Session Content: Travis [name] is a 71 y.o. male presenting for a follow-up appointment to address the previously established treatment goal of increasing coping skills.Today's appointment was a telepsychological visit due to COVID-19. Travis [name] provided verbal consent for today's telepsychological appointment and he is aware he is responsible for securing confidentiality on his end of the session. Prior to proceeding with today's appointment, Travis [name]'s physical location at the time of this appointment was obtained as well a phone number he could be reached at in the event of technical difficulties. Travis [name] and this provider participated in today's telepsychological service. Of note, today's appointment was switched to a regular telephone call at 12:04pm with Travis [name]'s verbal consent due to his phone having low battery.  ? ?This provider conducted a brief check-in. Travis [name] shared, "I have a real severe UTI. I have an abscess tooth that needs to be pulled Thursday. I have a sinus infection and both sides of my back hurt."  He noted he is supposed to receive medications for the UTI, tooth pain, and back pain today. Travis [name] stated he is trying to cope by visualizing "a boat on the river." Positive reinforcement was provided. Notably, Travis [name] discussed feeling "very frustrated" as he is reportedly gaining weight. Further explored and processed. He discussed challenges with eating congruent to his structured meal plan due to his dental issues and illnesses. It was reflected that despite these challenges, he continues to make better choices, engage in portion  control, and avoid engagement in emotional/binge eating behaviors. Positive reinforcement was provided. Based on what Travis [name] shared today, psychoeducation was provided regarding self-compassion and its impact on well-being and on emotional/binge eating behaviors. Travis [name] was engaged in a self-compassion exercise to help with eating-related challenges and other ongoing stressors. He was encouraged to regularly ask, ?What do I need right now?" Overall, Travis [name] was receptive to today's appointment as evidenced by openness to sharing, responsiveness to feedback, and willingness to implement discussed strategies . ? ?Mental Status Examination:  ?Appearance: neat ?Behavior: appropriate to circumstances ?Mood: neutral ?Affect: mood congruent ?Speech: WNL ?Eye Contact: appropriate ?Psychomotor Activity: WNL ?Gait: unable to assess ?Thought Process: linear, logical, and goal directed and denies suicidal, homicidal, and self-harm ideation, plan and intent  ?Thought Content/Perception: no hallucinations, delusions, bizarre thinking or behavior endorsed or observed ?Orientation: AAOx4 ?Memory/Concentration: memory, attention, language, and fund of knowledge intact  ?Insight: fair ?Judgment: fair ? ?Interventions:  ?Conducted a brief chart review ?Conducted a risk assessment ?Provided empathic reflections and validation ?Provided positive reinforcement ?Employed supportive psychotherapy interventions to facilitate reduced distress and to improve coping skills with identified stressors ?Psychoeducation provided regarding self-compassion ?Engaged pt in a self-compassion exercise ? ?DSM-5 Diagnosis(es): F32.89 Other Specified Depressive Disorder, Emotional and Binge Eating Behaviors ? ?Treatment Goal & Progress: During the initial appointment with this provider, the following treatment goal was established: increase coping skills. Travis [name] has demonstrated progress in his goal as evidenced by increased awareness of hunger patterns,  reduction in emotional eating behaviors , and reduction in binge eating behaviors. Travis [name] also continues to demonstrate willingness to engage in learned skill(s). ? ?Plan: The next appointment will be scheduled in two weeks, which will be via MyChart Video Visit. The  next session will focus on working towards the established treatment goal. Travis [name] will continue to meet with his primary therapist, noting their next appointment is mid-May. ? ?

## 2021-08-16 NOTE — Progress Notes (Signed)
? ?Chronic Care Management ?Pharmacy Note ? ?08/16/2021 ?Name:  Travis BEAGLE Sr. MRN:  [id number] DOB:  [birth year] ? ?Summary: ?-Patient reports that overall he feels he is doing well, reports compliance to current medications  ?-Notes that he has been getting a rash from butrans patch, has appointment with pain management on Friday to discuss options regarding medication ?-Was started on ozempic by weight management clinic - denies issues- weight improving  ?-Checking BP at home, averaging ~115/70 - denies issues with hyper/ hypo tension  ? ?Recommendations/Changes made from today's visit: ?-Recommending no changes to medications at this time, patient to continue to monitor blood pressures at home and reach out should BG increase >140/90 on average  ? ?Subjective: ?Travis DILAURO Sr. is an 71 y.o. year old male who is a primary patient of Jenny Reichmann, [name], MD.  The CCM team was consulted for assistance with disease management and care coordination needs.   ? ?Engaged with patient by telephone for follow up visit in response to provider referral for pharmacy case management and/or care coordination services.  ? ?Consent to Services:  ?The patient was given the following information about Chronic Care Management services today, agreed to services, and gave verbal consent: 1. CCM service includes personalized support from designated clinical staff supervised by the primary care provider, including individualized plan of care and coordination with other care providers 2. 24/7 contact phone numbers for assistance for urgent and routine care needs. 3. Service will only be billed when office clinical staff spend 20 minutes or more in a month to coordinate care. 4. Only one practitioner may furnish and bill the service in a calendar month. 5.The patient may stop CCM services at any time (effective at the end of the month) by phone call to the office staff. 6. The patient will be responsible for cost sharing (co-pay)  of up to 20% of the service fee (after annual deductible is met). Patient agreed to services and consent obtained. ? ?Patient Care Team: ?[name], MD as PCP - General (Internal Medicine) ?Burnell Blanks, MD as PCP - Cardiology (Cardiology) ?[name], MD as Attending Physician (Internal Medicine) ?Newt Minion, MD as Consulting Physician (Orthopedic Surgery) ?Greer Pickerel, MD as Consulting Physician (General Surgery) ?Pyrtle, Lajuan Lines, MD as Consulting Physician (Gastroenterology) ?Jessy Oto, MD as Consulting Physician (Orthopedic Surgery) ?Ludwig Clarks, DO as Consulting Physician (Neurology) ?Kathlee Nations, MD as Consulting Physician (Psychiatry) ?Park Breed, MD as Referring Physician ?Tomasa Blase, [facility] as Pharmacist (Pharmacist) ? ?Recent office visits:  ?None since last visit  ? ?Recent consult visits:  ?08/09/2021 - [name] [facility] - no changes to medications - f/u in 3 weeks  ?07/14/2021 - [name] - Weight management center - no changes to medications  ?07/11/2021 - Marjean Donna - Pain management - no changes to medications - consider moving forward with SI fusion   ?07/01/2021 - [facility] - dysphagia - barium swallow scheduled - no changes to medications  ?06/22/2021 - Dr. Marlou Sa - Orthopedic Surgery - f/u from left shoulder arthroplasty - no changes - f/u prn  ? ? ?Hospital visits:  ?None since last visit  ? ?Objective: ? ?Lab Results  ?Component Value Date  ? CREATININE 0.89 06/14/2021  ? BUN 14 06/14/2021  ? GFR 87.72 06/14/2021  ? GFRNONAA >60 03/18/2021  ? GFRAA >60 06/07/2019  ? NA 139 06/14/2021  ? K 4.0 06/14/2021  ? CALCIUM 9.2  06/14/2021  ? CO2 28 06/14/2021  ? GLUCOSE 107 (H) 06/14/2021  ? ? ?Lab Results  ?Component Value Date/Time  ? HGBA1C 5.5 06/14/2021 09:59 AM  ? HGBA1C 5.6 04/11/2021 09:49 AM  ? GFR 87.72 06/14/2021 09:59 AM  ? GFR 60.32 12/01/2020 01:48 PM  ? MICROALBUR <0.7 06/14/2021 09:59 AM  ? MICROALBUR <0.7 07/24/2019  02:47 PM  ?  ?Last diabetic Eye exam:  ?Lab Results  ?Component Value Date/Time  ? HMDIABEYEEXA No Retinopathy 11/30/2020 12:00 AM  ?  ?Last diabetic Foot exam:  ?No results found for: HMDIABFOOTEX  ? ?Lab Results  ?Component Value Date  ? CHOL 129 06/14/2021  ? HDL 48.10 06/14/2021  ? [place] 60 06/14/2021  ? TRIG 104.0 06/14/2021  ? CHOLHDL 3 06/14/2021  ? ? ? ?  Latest Ref Rng & Units 06/14/2021  ?  9:59 AM 04/11/2021  ?  9:49 AM 12/01/2020  ?  1:48 PM  ?Hepatic Function  ?Total Protein 6.0 - 8.3 g/dL 6.4   6.5   6.6    ?Albumin 3.5 - 5.2 g/dL 4.0   4.1   4.1    ?AST 0 - 37 U/L 17   17   22     ?ALT 0 - 53 U/L 11   8   11     ?Alk Phosphatase 39 - 117 U/L 58   82   58    ?Total Bilirubin 0.2 - 1.2 mg/dL 0.3   0.4   0.4    ?Bilirubin, Direct 0.0 - 0.3 mg/dL 0.1    0.1    ? ? ?Lab Results  ?Component Value Date/Time  ? TSH 2.63 06/14/2021 09:59 AM  ? TSH 1.960 04/11/2021 09:49 AM  ? FREET4 1.22 04/11/2021 09:49 AM  ? ? ? ?  Latest Ref Rng & Units 06/14/2021  ?  9:59 AM 04/13/2021  ?  3:21 PM 03/19/2021  ?  2:20 AM  ?CBC  ?WBC 4.0 - 10.5 K/uL 3.7   5.7   6.6    ?Hemoglobin 13.0 - 17.0 g/dL 12.3   11.4   10.6    ?Hematocrit 39.0 - 52.0 % 37.6   32.9   31.6    ?Platelets 150.0 - 400.0 K/uL 219.0   327   218    ? ? ?Lab Results  ?Component Value Date/Time  ? VD25OH 40.99 06/14/2021 09:59 AM  ? VD25OH 51.0 04/11/2021 09:49 AM  ? VD25OH 53.07 12/01/2020 01:48 PM  ? ? ?Clinical ASCVD: Yes  ?The ASCVD Risk score (Arnett DK, et al., 2019) failed to calculate for the following reasons: ?  The patient has a prior MI or stroke diagnosis   ? ? ?  04/06/2021  ?  1:30 PM 09/17/2020  ?  3:18 PM 09/17/2020  ?  2:42 PM  ?Depression screen PHQ 2/9  ?Decreased Interest 2 0 3  ?Down, Depressed, Hopeless 3 0 3  ?PHQ - 2 Score 5 0 6  ?Altered sleeping 0  0  ?Tired, decreased energy 2  3  ?Change in appetite 3  3  ?Feeling bad or failure about yourself  3  3  ?Trouble concentrating 2  3  ?Moving slowly or fidgety/restless 2  0  ?Suicidal  thoughts 0  0  ?PHQ-9 Score 17  18  ?Difficult doing work/chores Not difficult at all    ? ?Social History  ? ?Tobacco Use  ?Smoking Status Former  ? Types: Cigars  ? Quit date: 08/28/2010  ? Years since quitting:  10.9  ?Smokeless Tobacco Never  ?Tobacco Comments  ? 04/18/2016 "smoked 1 cigar/wk when I did smoke"  ? ?BP Readings from Last 3 Encounters:  ?08/09/21 105/61  ?07/14/21 (!) 148/76  ?07/01/21 110/60  ? ?Pulse Readings from Last 3 Encounters:  ?08/09/21 70  ?07/14/21 85  ?07/01/21 (!) 57  ? ?Wt Readings from Last 3 Encounters:  ?08/09/21 272 lb (123.4 kg)  ?07/14/21 270 lb (122.5 kg)  ?07/01/21 282 lb 6 oz (128.1 kg)  ? ?BMI Readings from Last 3 Encounters:  ?08/09/21 39.03 kg/m?  ?07/14/21 38.74 kg/m?  ?07/01/21 40.52 kg/m?  ? ? ?Assessment/Interventions: Review of patient past medical history, allergies, medications, health status, including review of consultants reports, laboratory and other test data, was performed as part of comprehensive evaluation and provision of chronic care management services.  ? ?SDOH:  (Social Determinants of Health) assessments and interventions performed: Yes ? ?SDOH Screenings  ? ?Alcohol Screen: Not on file  ?Depression (PHQ2-9): Medium Risk  ? PHQ-2 Score: 17  ?Financial Resource Strain: Low Risk   ? Difficulty of Paying Living Expenses: Not very hard  ?Food Insecurity: Not on file  ?Housing: Not on file  ?Physical Activity: Not on file  ?Social Connections: Not on file  ?Stress: Not on file  ?Tobacco Use: Medium Risk  ? Smoking Tobacco Use: Former  ? Smokeless Tobacco Use: Never  ? Passive Exposure: Not on file  ?Transportation Needs: Not on file  ? ? ?[place] ? ?No Known Allergies ? ?Medications Reviewed Today   ? ? Reviewed by Tomasa Blase, [facility] (Pharmacist) on 08/16/21 at 1417  Med List Status: <None>  ? ?Medication Order Taking? Sig Documenting Provider Last Dose Status Informant  ?albuterol (VENTOLIN HFA) 108 (90 Base) MCG/ACT inhaler [id number] Yes Inhale 2  puffs into the lungs every 6 (six) hours as needed for wheezing or shortness of breath. [name], MD Taking Active Self  ?ARIPiprazole (ABILIFY) 10 MG tablet [id number] Yes Take 1 tablet (10 mg total) by mo

## 2021-08-17 ENCOUNTER — Ambulatory Visit (INDEPENDENT_AMBULATORY_CARE_PROVIDER_SITE_OTHER): Payer: Medicare Other | Admitting: Neurology

## 2021-08-17 ENCOUNTER — Encounter: Payer: Self-pay | Admitting: Neurology

## 2021-08-17 VITALS — BP 149/81 | HR 52 | Ht 70.0 in | Wt 272.0 lb

## 2021-08-17 DIAGNOSIS — G4733 Obstructive sleep apnea (adult) (pediatric): Secondary | ICD-10-CM | POA: Insufficient documentation

## 2021-08-17 DIAGNOSIS — R4189 Other symptoms and signs involving cognitive functions and awareness: Secondary | ICD-10-CM | POA: Diagnosis not present

## 2021-08-17 DIAGNOSIS — Z9989 Dependence on other enabling machines and devices: Secondary | ICD-10-CM | POA: Diagnosis not present

## 2021-08-17 DIAGNOSIS — G4737 Central sleep apnea in conditions classified elsewhere: Secondary | ICD-10-CM | POA: Diagnosis not present

## 2021-08-17 DIAGNOSIS — D649 Anemia, unspecified: Secondary | ICD-10-CM | POA: Diagnosis not present

## 2021-08-17 DIAGNOSIS — R4789 Other speech disturbances: Secondary | ICD-10-CM | POA: Diagnosis not present

## 2021-08-17 DIAGNOSIS — G4734 Idiopathic sleep related nonobstructive alveolar hypoventilation: Secondary | ICD-10-CM

## 2021-08-17 DIAGNOSIS — F1190 Opioid use, unspecified, uncomplicated: Secondary | ICD-10-CM | POA: Diagnosis not present

## 2021-08-17 NOTE — Patient Instructions (Signed)
Management of Memory Problems ? ?There are some general things you can do to help manage your memory problems.  Your memory may not in fact recover, but by using techniques and strategies you will be able to manage your memory difficulties better. ? ?1)  Establish a routine. ?Try to establish and then stick to a regular routine.  By doing this, you will get used to what to expect and you will reduce the need to rely on your memory.  Also, try to do things at the same time of day, such as taking your medication or checking your calendar first thing in the morning. ?Think about think that you can do as a part of a regular routine and make a list.  Then enter them into a daily planner to remind you.  This will help you establish a routine. ? ?2)  Organize your environment. ?Organize your environment so that it is uncluttered.  Decrease visual stimulation.  Place everyday items such as keys or cell phone in the same place every day (ie.  Basket next to front door) ?Use post it notes with a brief message to yourself (ie. Turn off light, lock the door) ?Use labels to indicate where things go (ie. Which cupboards are for food, dishes, etc.) ?Keep a notepad and pen by the telephone to take messages ? ?3)  Memory Aids ?A diary or journal/notebook/daily planner ?Making a list (shopping list, chore list, to do list that needs to be done) ?Using an alarm as a reminder (kitchen timer or cell phone alarm) ?Using cell phone to store information (Notes, Calendar, Reminders) ?Calendar/White board placed in a prominent position ?Post-it notes ? ?In order for memory aids to be useful, you need to have good habits.  It's no good remembering to make a note in your journal if you don't remember to look in it.  Try setting aside a certain time of day to look in journal. ? ?4)  Improving mood and managing fatigue. ?There may be other factors that contribute to memory difficulties.  Factors, such as anxiety, depression and tiredness can  affect memory. ?Regular gentle exercise can help improve your mood and give you more energy. ?Simple relaxation techniques may help relieve symptoms of anxiety ?Try to get back to completing activities or hobbies you enjoyed doing in the past. ?Learn to pace yourself through activities to decrease fatigue. ?Find out about some local support groups where you can share experiences with others. ?Try and achieve 7-8 hours of sleep at night.Memory Compensation Strategies ? ?Use "WARM" strategy. ? W= write it down ? A= associate it ? R= repeat it ? M= make a mental note ? ?2.   You can keep a Social worker. ? Use a 3-ring notebook with sections for the following: calendar, important names and phone numbers,  medications, doctors' names/phone numbers, lists/reminders, and a section to journal what you did  each day.  ? ?3.    Use a calendar to write appointments down. ? ?4.    Write yourself a schedule for the day. ? This can be placed on the calendar or in a separate section of the Memory Notebook.  Keeping a  regular schedule can help memory. ? ?5.    Use medication organizer with sections for each day or morning/evening pills. ? You may need help loading it ? ?6.    Keep a basket, or pegboard by the door. ? Place items that you need to take out with you in the basket  or on the pegboard.  You may also want to  include a message board for reminders. ? ?7.    Use sticky notes. ? Place sticky notes with reminders in a place where the task is performed.  For example: " turn off the  stove" placed by the stove, "lock the door" placed on the door at eye level, " take your medications" on  the bathroom mirror or by the place where you normally take your medications. ? ?8.    Use alarms/timers. ? Use while cooking to remind yourself to check on food or as a reminder to take your medicine, or as a  reminder to make a call, or as a reminder to perform another task, etc. ? ?
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
## 2021-08-19 ENCOUNTER — Telehealth: Payer: Self-pay | Admitting: *Deleted

## 2021-08-19 DIAGNOSIS — I2510 Atherosclerotic heart disease of native coronary artery without angina pectoris: Secondary | ICD-10-CM

## 2021-08-19 DIAGNOSIS — M961 Postlaminectomy syndrome, not elsewhere classified: Secondary | ICD-10-CM | POA: Diagnosis not present

## 2021-08-19 DIAGNOSIS — M7918 Myalgia, other site: Secondary | ICD-10-CM | POA: Diagnosis not present

## 2021-08-19 DIAGNOSIS — G8929 Other chronic pain: Secondary | ICD-10-CM | POA: Diagnosis not present

## 2021-08-19 DIAGNOSIS — F320 Major depressive disorder, single episode, mild: Secondary | ICD-10-CM | POA: Diagnosis not present

## 2021-08-19 DIAGNOSIS — M47816 Spondylosis without myelopathy or radiculopathy, lumbar region: Secondary | ICD-10-CM | POA: Diagnosis not present

## 2021-08-19 DIAGNOSIS — I10 Essential (primary) hypertension: Secondary | ICD-10-CM

## 2021-08-19 NOTE — Telephone Encounter (Signed)
? ?  Pre-operative Risk Assessment  ?  ?Patient Name: Travis NEGRON Sr.  ?DOB: 07-04-1950 ?MRN: [id number]  ? ?  ? ?Request for Surgical Clearance   ? ?Procedure:   S1 JOINT FUSION  ALLOGRAFT ? ?Date of Surgery:  Clearance 10/05/21                              ?   ?Surgeon:  DR. Percival Spanish HAMSHER ?Surgeon's Group or Practice Name:  [facility] ?Phone number:  [phone] ?Fax number:  [phone] ATTN: Ermalinda Barrios, RN ?  ?Type of Clearance Requested:   ?- Medical  ?- Pharmacy:  Hold Prasugrel (Effient) x 8 DAYS PRIOR TO SURGERY; RESUME 24 HOURS POST OP ?  ?Type of Anesthesia:  Not Indicated LEFT MESSAGE FOR CALL WITH TYPE OF ANESTHESIA BEING USED ?  ?Additional requests/questions:   ? ?Signed, ?Julaine Hua   ?08/19/2021, 2:21 PM  ? ?
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
## 2021-08-24 ENCOUNTER — Telehealth: Payer: Self-pay | Admitting: *Deleted

## 2021-08-24 DIAGNOSIS — R3915 Urgency of urination: Secondary | ICD-10-CM | POA: Diagnosis not present

## 2021-08-24 NOTE — Telephone Encounter (Signed)
Pt is agreeable to plan of care for tele pre op appt 08/26/21 @ 4 pm. Med rec and consent have been done.  ? ?  ?Patient Consent for Virtual Visit  ? ? ?   ? ?Travis Novak Sr. has provided verbal consent on 08/24/2021 for a virtual visit (video or telephone). ? ? ?CONSENT FOR VIRTUAL VISIT FOR:  Travis Novak Sr.  ?By participating in this virtual visit I agree to the following: ? ?I hereby voluntarily request, consent and authorize [facility] and its employed or contracted physicians, physician assistants, nurse practitioners or other licensed health care professionals (the Practitioner), to provide me with telemedicine health care services (the ?Services") as deemed necessary by the treating Practitioner. I acknowledge and consent to receive the Services by the Practitioner via telemedicine. I understand that the telemedicine visit will involve communicating with the Practitioner through live audiovisual communication technology and the disclosure of certain medical information by electronic transmission. I acknowledge that I have been given the opportunity to request an in-person assessment or other available alternative prior to the telemedicine visit and am voluntarily participating in the telemedicine visit. ? ?I understand that I have the right to withhold or withdraw my consent to the use of telemedicine in the course of my care at any time, without affecting my right to future care or treatment, and that the Practitioner or I may terminate the telemedicine visit at any time. I understand that I have the right to inspect all information obtained and/or recorded in the course of the telemedicine visit and may receive copies of available information for a reasonable fee.  I understand that some of the potential risks of receiving the Services via telemedicine include:  ?Delay or interruption in medical evaluation due to technological equipment failure or disruption; ?Information transmitted may  not be sufficient (e.g. poor resolution of images) to allow for appropriate medical decision making by the Practitioner; and/or  ?In rare instances, security protocols could fail, causing a breach of personal health information. ? ?Furthermore, I acknowledge that it is my responsibility to provide information about my medical history, conditions and care that is complete and accurate to the best of my ability. I acknowledge that Practitioner's advice, recommendations, and/or decision may be based on factors not within their control, such as incomplete or inaccurate data provided by me or distortions of diagnostic images or specimens that may result from electronic transmissions. I understand that the practice of medicine is not an exact science and that Practitioner makes no warranties or guarantees regarding treatment outcomes. I acknowledge that a copy of this consent can be made available to me via my patient portal ([facility]), or I can request a printed copy by calling the office of [facility].   ? ?I understand that my insurance will be billed for this visit.  ? ?I have read or had this consent read to me. ?I understand the contents of this consent, which adequately explains the benefits and risks of the Services being provided via telemedicine.  ?I have been provided ample opportunity to ask questions regarding this consent and the Services and have had my questions answered to my satisfaction. ?I give my informed consent for the services to be provided through the use of telemedicine in my medical care ? ? ? ?

## 2021-08-24 NOTE — Telephone Encounter (Signed)
? ?  Patient Name: Travis DETTMANN Sr.  ?DOB: [birth year] ?MRN: [id number] ? ?Primary Cardiologist: Lauree Chandler, MD ? ?Chart reviewed as part of pre-operative protocol coverage.  ? ?Per Dr. Angelena Form, primary cardiologist, patient may hold Effient for 8 days prior to surgery. Please resume Effient as soon as possible postoperatively, at the discretion of the surgeon. ? ?Preoperative team, please contact this patient and set up a phone call appointment for further cardiac evaluation.  Thank you for your help. ? ?Lenna Sciara, NP ?08/24/2021, 10:59 AM ? ? ?

## 2021-08-24 NOTE — Telephone Encounter (Signed)
Pt is agreeable to plan of care for tele pre op appt 08/26/21 @ 4 pm. Med rec and consent have been done.  ?

## 2021-08-24 NOTE — Telephone Encounter (Signed)
Requesting office called back yesterday and left vm with type of anesthesia to be used., MAC.  ?

## 2021-08-25 ENCOUNTER — Ambulatory Visit (INDEPENDENT_AMBULATORY_CARE_PROVIDER_SITE_OTHER): Payer: Medicare Other | Admitting: Psychologist

## 2021-08-25 DIAGNOSIS — F331 Major depressive disorder, recurrent, moderate: Secondary | ICD-10-CM | POA: Diagnosis not present

## 2021-08-25 DIAGNOSIS — Z634 Disappearance and death of family member: Secondary | ICD-10-CM

## 2021-08-25 NOTE — Progress Notes (Signed)
[facility] Counselor/Therapist Progress Note ? ?Patient ID: Travis [name]., MRN: [id number],   ? ?Date: 08/25/2021 ? ?Time Spent: 11:07 am to 11:45 am; total time: 38 minutes ? ?  This session was held via video webex teletherapy due to the coronavirus risk at this time. The patient consented to video teletherapy and was located at his home during this session. He is aware it is the responsibility of the patient to secure confidentiality on his end of the session. The provider was in a private home office for the duration of this session. Limits of confidentiality were discussed with the patient.  ?  ? ?Treatment Type: Individual Therapy ? ?Reported Symptoms: Depression and grief ? ?Mental Status Exam: ?Appearance:  Casual     ?Behavior: Appropriate  ?Motor: Normal  ?Speech/Language:  Clear and Coherent  ?Affect: Appropriate  ?Mood: normal  ?Thought process: normal  ?Thought content:   WNL  ?Sensory/Perceptual disturbances:   WNL  ?Orientation: oriented to person, place, and time/date  ?Attention: Good  ?Concentration: Good  ?Memory: WNL  ?Fund of knowledge:  Good  ?Insight:   Fair  ?Judgment:  Fair  ?Impulse Control: Good  ? ?Risk Assessment: ?Danger to Self:  No ?Self-injurious Behavior: No ?Danger to Others: No ?Duty to Warn:no ?Physical Aggression / Violence:No  ?Access to Firearms a concern: No  ?Gang Involvement:No  ? ?Subjective: Beginning the session, patient endorsed experiencing pain and asked for strategies to alleviate pain. After reviewing the treatment plan, patient participated in guided imagery describing it as very helpful. Elaborating, he stated that he was experiencing less pain. He then spent the session reflecting on the life of his son and how to honor his son. He ended the session talking about going to [place] in a few weeks. He was agreeable to homework and following up. He denied suicidal and homicidal ideation.   ? ?Interventions:  Worked on developing a therapeutic  relationship with the patient using active listening and reflective statements. Provided emotional support using empathy and validation. Reviewed the treatment plan with the patient. Reviewed events since the intake. Normalized and validated expressed emotions. Explored what current coping strategies patient was using. Provided psychoeducation about guided imagery. Practiced and processed guided imagery. Praised patient for experiencing less distress. Assisted in problem solving. Assisted doing a life review of his son. Explored ways to honor his son. Processed thoughts and emotions. Assigned homework. Assessed for suicidal and homicidal ideation. ? ?Homework: Implement guided imagery ? ?Next Session: Review homework and emotional support.  ? ?Diagnosis: F33.1 major depressive affective disorder, recurrent, moderate and F81.8 uncomplicated bereavement.  ? ?Plan:  ?Goals ?Alleviate depressive symptoms ?Recognize, accept, and cope with depressive feelings ?Develop healthy thinking patterns ?Develop healthy interpersonal relationships ?Begin a healthy grieving process ? ?Objectives target date for all objectives is 08/05/2022 ?Cooperate with a medication evaluation by a physician ?Verbalize an accurate understanding of depression ?Verbalize an understanding of the treatment ?Identify and replace thoughts that support depression ?Learn and implement behavioral strategies ?Verbalize an understanding and resolution of current interpersonal problems ?Learn and implement problem solving and decision making skills ?Learn and implement conflict resolution skills to resolve interpersonal problems ?Verbalize an understanding of healthy and unhealthy emotions verbalize insight into how past relationships may be influence current experiences with depression ?Use mindfulness and acceptance strategies and increase value based behavior  ?Increase hopeful statements about the future.  ?Travis in detail the story of the current loss that  is triggering symptoms ?Read books on the  topic of grief ?Watch videos on the theme of grief ?Begin verbalizing feelings associated with the loss ?Attend a grief support group ?express thoughts and feelings about the deceased ?Identify and voice positives about the deceased ?implement acts of spiritual faith  ?Interventions ?Consistent with treatment model, discuss how change in cognitive, behavioral, and interpersonal can help client alleviate depression ?CBT ?Behavioral activation help the client explore the relationship, nature of the dispute,  ?Help the client develop new interpersonal skills and relationships ?Conduct Problem so living therapy ?Teach conflict resolution skills ?Use a process-experiential approach ?Conduct TLDP ?Conduct ACT ?Evaluate need for psychotropic medication ?Monitor adherence to medication  ?create a safe environment and actively build trust ?use empathy, compassion, and support ?ask the patient to write a letter to the lost person ?conduct empty chair ?ask the patient to discuss and list the positives and negative aspects of the person ?encourage patient to rely upon his/her spiritual faith  ?ask client to read books on grief ?ask patient to watch videos about grief ?assist patient in identifying emotions  ?ask patient to attend support group  ? ?The patient and clinician reviewed the treatment plan on 08/25/2021. The patient approved of the treatment plan.  ? ?Conception Chancy, PsyD ? ? ? ?

## 2021-08-25 NOTE — Progress Notes (Signed)
                Milliana Reddoch, PsyD 

## 2021-08-26 ENCOUNTER — Ambulatory Visit (INDEPENDENT_AMBULATORY_CARE_PROVIDER_SITE_OTHER): Payer: Medicare Other | Admitting: Nurse Practitioner

## 2021-08-26 DIAGNOSIS — Z01810 Encounter for preprocedural cardiovascular examination: Secondary | ICD-10-CM | POA: Diagnosis not present

## 2021-08-26 DIAGNOSIS — M4726 Other spondylosis with radiculopathy, lumbar region: Secondary | ICD-10-CM | POA: Diagnosis not present

## 2021-08-26 DIAGNOSIS — M48061 Spinal stenosis, lumbar region without neurogenic claudication: Secondary | ICD-10-CM | POA: Diagnosis not present

## 2021-08-26 DIAGNOSIS — M4326 Fusion of spine, lumbar region: Secondary | ICD-10-CM | POA: Diagnosis not present
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
## 2021-08-29 DIAGNOSIS — J9620 Acute and chronic respiratory failure, unspecified whether with hypoxia or hypercapnia: Secondary | ICD-10-CM | POA: Diagnosis not present

## 2021-08-29 DIAGNOSIS — R0600 Dyspnea, unspecified: Secondary | ICD-10-CM | POA: Diagnosis not present

## 2021-08-29 DIAGNOSIS — G4733 Obstructive sleep apnea (adult) (pediatric): Secondary | ICD-10-CM | POA: Diagnosis not present

## 2021-08-30 ENCOUNTER — Encounter (INDEPENDENT_AMBULATORY_CARE_PROVIDER_SITE_OTHER): Payer: Self-pay | Admitting: Family Medicine

## 2021-08-30 ENCOUNTER — Telehealth (INDEPENDENT_AMBULATORY_CARE_PROVIDER_SITE_OTHER): Payer: Medicare Other | Admitting: Family Medicine

## 2021-08-30 ENCOUNTER — Telehealth (INDEPENDENT_AMBULATORY_CARE_PROVIDER_SITE_OTHER): Payer: Medicare Other | Admitting: Psychology

## 2021-08-30 DIAGNOSIS — E669 Obesity, unspecified: Secondary | ICD-10-CM

## 2021-08-30 DIAGNOSIS — Z7985 Long-term (current) use of injectable non-insulin antidiabetic drugs: Secondary | ICD-10-CM | POA: Diagnosis not present

## 2021-08-30 DIAGNOSIS — F3289 Other specified depressive episodes: Secondary | ICD-10-CM | POA: Diagnosis not present

## 2021-08-30 DIAGNOSIS — E1165 Type 2 diabetes mellitus with hyperglycemia: Secondary | ICD-10-CM | POA: Diagnosis not present

## 2021-08-30 DIAGNOSIS — I152 Hypertension secondary to endocrine disorders: Secondary | ICD-10-CM

## 2021-08-30 DIAGNOSIS — E1159 Type 2 diabetes mellitus with other circulatory complications: Secondary | ICD-10-CM

## 2021-08-30 DIAGNOSIS — Z6839 Body mass index (BMI) 39.0-39.9, adult: Secondary | ICD-10-CM

## 2021-08-30 MED ORDER — OZEMPIC (0.25 OR 0.5 MG/DOSE) 2 MG/1.5ML ~~LOC~~ SOPN: 0.5000 mg | PEN_INJECTOR | SUBCUTANEOUS | 0 refills | Status: DC

## 2021-08-30 NOTE — Progress Notes (Signed)
?  Office: [phone]  /  Fax: [phone] ? ? ? ?Date: 09/13/2021   ?Appointment Start Time: 11:31am ?Duration: 20 minutes ?Provider: Glennie Isle, Psy.D. ?Type of Session: Individual Therapy  ?Location of Patient: Home (private location) ?Location of Provider: Provider's Home (private office) ?Type of Contact: Telepsychological Visit via MyChart Video Visit ? ?Session Content: Travis [name] is a 71 y.o. male presenting for a follow-up appointment to address the previously established treatment goal of increasing coping skills.Today's appointment was a telepsychological visit due to COVID-19. Travis [name] provided verbal consent for today's telepsychological appointment and he is aware he is responsible for securing confidentiality on his end of the session. Prior to proceeding with today's appointment, Travis [name]'s physical location at the time of this appointment was obtained as well a phone number he could be reached at in the event of technical difficulties. Travis [name] and this provider participated in today's telepsychological service.  ? ?This provider conducted a brief check-in. Travis [name] reported he is experiencing "a lot of pain," which has contributed to challenges with eating. He provided an update regarding his health. Travis [name] stated he continues to stay in contact with his providers. Notably, he discussed he is coping by engaging in the shared exercises (5-4-3-2-1 and Leaves On A Stream). He indicated using the aforementioned skills help some with pain. Thus, psychoeducation provided regarding relaxation techniques and pain management. He was engaged in a guided imagery exercise and his experience was processed. He noted a reduction in his pain. Travis [name] provided verbal consent during today's appointment for this provider to send the handout for today's exercise via e-mail. Furthermore, Travis [name] stated he is "excited" for his trip to [place] next week. Overall, Travis [name] was receptive to today's appointment as evidenced by  openness to sharing, responsiveness to feedback, and willingness to continue engaging in learned skills. ? ?Mental Status Examination:  ?Appearance: neat ?Behavior: appropriate to circumstances ?Mood: neutral ?Affect: mood congruent ?Speech: WNL ?Eye Contact: appropriate ?Psychomotor Activity: WNL ?Gait: unable to assess ?Thought Process: linear, logical, and goal directed and denies suicidal, homicidal, and self-harm ideation, plan and intent  ?Thought Content/Perception: no hallucinations, delusions, bizarre thinking or behavior endorsed or observed ?Orientation: AAOx4 ?Memory/Concentration: memory, attention, language, and fund of knowledge intact  ?Insight: fair ?Judgment: fair ? ?Interventions:  ?Conducted a brief chart review ?Conducted a risk assessment ?Provided empathic reflections and validation ?Employed supportive psychotherapy interventions to facilitate reduced distress and to improve coping skills with identified stressors ?Engaged patient in a progressive muscle relaxation exercise ?Engaged patient in a guided imagery exercise ? ?DSM-5 Diagnosis(es): F32.89 Other Specified Depressive Disorder, Emotional and Binge Eating Behaviors ? ?Treatment Goal & Progress: During the initial appointment with this provider, the following treatment goal was established: increase coping skills. [name] has demonstrated progress in his goal as evidenced by increased awareness of hunger patterns, reduction in emotional eating behaviors , and reduction in binge eating behaviors. [name] also continues to demonstrate willingness to engage in learned skill(s). ? ?Plan: The next appointment is scheduled for Oct 04, 2021 at 12:00pm, which will be via [place] Visit. The next session will focus on working towards the established treatment goal. Travis [name] will continue to meet with his primary therapist.  ? ?

## 2021-08-31 ENCOUNTER — Other Ambulatory Visit: Payer: Self-pay | Admitting: Internal Medicine

## 2021-08-31 NOTE — Progress Notes (Signed)
? ? ?TeleHealth Visit:  ?Due to the COVID-19 pandemic, this visit was completed with telemedicine (audio/video) technology to reduce patient and provider exposure as well as to preserve personal protective equipment.  ? ?Travis [name] has verbally consented to this TeleHealth visit. The patient is located at home, the provider is located at the [facility] office. The participants in this visit include the listed provider and patient. The visit was conducted today via MyChart video. ? ? ?Chief Complaint: OBESITY ?Travis [name] is here to discuss his progress with his obesity treatment plan along with follow-up of his obesity related diagnoses. Travis [name] is on the Category 4 Plan and states he is following his eating plan approximately 60% of the time. Travis [name] states he has been working out for 60 minutes 3 times per week. ? ?Today's visit was #: 8 ?Starting weight: 302 lbs ?Starting date: 04/06/2021 ? ?Interim History: Travis [name] has a lot of medical issues going on, UTI, abscessed tooth, and sinus infection. Getting his tooth pulled in 2 days. Sciatica is also acting up. He hasn't been able to tolerate much food wise due to his tooth. He reports a weight of 275 lbs today. Going to start antibiotics today.  ? ?Subjective:  ? ?1. Type 2 diabetes mellitus with hyperglycemia, without long-term current use of insulin ([place]) ?Travis [name] is on Ozempic 0.5 mg. He denies GI side effects, but decent satiety.  ? ?2. Hypertension associated with diabetes ([place]) ?Travis [name]'s blood pressure is labile in the past however, he is awaiting SI fusion as experiences significant sciatica pain. He sees Cardiology.  ? ?Assessment/Plan:  ? ?1. Type 2 diabetes mellitus with hyperglycemia, without long-term current use of insulin ([place]) ?Terius will continue his medications. We will refill Ozempic 0.5 mg SubQ weekly for 1 month, with no change in dose until current medical issues are resolved and able to assess food intake.  ? ?- Semaglutide,0.25  or 0.5MG/DOS, (OZEMPIC, 0.25 OR 0.5 MG/DOSE,) 2 MG/1.5ML SOPN; Inject 0.5 mg into the skin once a week.  Dispense: 1.5 mL; Refill: 0 ? ?2. Hypertension associated with diabetes ([place]) ?We will follow up on Travis [name]'s blood pressure at his next appointment. He will continue his current medication dosages.  ? ?3. Obesity with current BMI of 39.03 ?Travis [name] is currently in the action stage of change. As such, his goal is to continue with weight loss efforts. He has agreed to the Category 4 Plan.  ? ?Exercise goals: No exercise has been prescribed at this time. ? ?Behavioral modification strategies: increasing lean protein intake, meal planning and cooking strategies, keeping healthy foods in the home, and planning for success. ? ?Travis [name] has agreed to follow-up with our clinic in 4 weeks. He was informed of the importance of frequent follow-up visits to maximize his success with intensive lifestyle modifications for his multiple health conditions. ? ?Objective:  ? ?VITALS: Per patient if applicable, see vitals. ?GENERAL: Alert and in no acute distress. ?CARDIOPULMONARY: No increased WOB. Speaking in clear sentences.  ?PSYCH: Pleasant and cooperative. Speech normal rate and rhythm. Affect is appropriate. Insight and judgement are appropriate. Attention is focused, linear, and appropriate.  ?NEURO: Oriented as arrived to appointment on time with no prompting.  ? ?Lab Results  ?Component Value Date  ? CREATININE 0.89 06/14/2021  ? BUN 14 06/14/2021  ? NA 139 06/14/2021  ? K 4.0 06/14/2021  ? CL 103 06/14/2021  ? CO2 28 06/14/2021  ? ?Lab Results  ?Component Value Date  ? ALT 11 06/14/2021  ? AST  17 06/14/2021  ? ALKPHOS 58 06/14/2021  ? BILITOT 0.3 06/14/2021  ? ?Lab Results  ?Component Value Date  ? HGBA1C 5.5 06/14/2021  ? HGBA1C 5.6 04/11/2021  ? HGBA1C 5.8 (H) 03/09/2021  ? HGBA1C 6.1 12/01/2020  ? HGBA1C 5.7 (A) 02/24/2020  ? ?Lab Results  ?Component Value Date  ? INSULIN 12.2 04/11/2021  ? ?Lab Results  ?Component  Value Date  ? TSH 2.63 06/14/2021  ? ?Lab Results  ?Component Value Date  ? CHOL 129 06/14/2021  ? HDL 48.10 06/14/2021  ? [place] 60 06/14/2021  ? TRIG 104.0 06/14/2021  ? CHOLHDL 3 06/14/2021  ? ?Lab Results  ?Component Value Date  ? VD25OH 40.99 06/14/2021  ? VD25OH 51.0 04/11/2021  ? VD25OH 53.07 12/01/2020  ? ?Lab Results  ?Component Value Date  ? WBC 3.7 (L) 06/14/2021  ? HGB 12.3 (L) 06/14/2021  ? HCT 37.6 (L) 06/14/2021  ? MCV 88.1 06/14/2021  ? PLT 219.0 06/14/2021  ? ?Lab Results  ?Component Value Date  ? IRON 49 12/01/2020  ? TIBC 186 (L) 10/18/2014  ? FERRITIN 25.6 12/01/2020  ? ? ?Attestation Statements:  ? ?Reviewed by clinician on day of visit: allergies, medications, problem list, medical history, surgical history, family history, social history, and previous encounter notes. ? ? ?I, Trixie Dredge, am acting as transcriptionist for Coralie Common, MD. ? ?I have reviewed the above documentation for accuracy and completeness, and I agree with the above. Coralie Common, MD ? ? ?

## 2021-09-02 ENCOUNTER — Other Ambulatory Visit: Payer: Self-pay | Admitting: Internal Medicine

## 2021-09-06 DIAGNOSIS — N35011 Post-traumatic bulbous urethral stricture: Secondary | ICD-10-CM | POA: Diagnosis not present

## 2021-09-09 ENCOUNTER — Telehealth: Payer: Self-pay

## 2021-09-09 ENCOUNTER — Other Ambulatory Visit: Payer: Self-pay | Admitting: Internal Medicine

## 2021-09-09 NOTE — Progress Notes (Signed)
? ? ?Chronic Care Management ?Pharmacy Assistant  ? ?Name: Travis TATA Sr.  MRN: [id number] DOB: Oct 19, 1950 ? ? ?Reason for Encounter: Disease State-General ?  ? ?Recent office visits:  ?08/30/21 [facility] (Diabetes Mellitus) No orders or med changes ? ?Recent consult visits:  ?Nonce since last coordination call ? ?Hospital visits:  ?None since last coordination call ? ?Medications: ?Outpatient Encounter Medications as of 09/09/2021  ?Medication Sig  ? albuterol (VENTOLIN HFA) 108 (90 Base) MCG/ACT inhaler Inhale 2 puffs into the lungs every 6 (six) hours as needed for wheezing or shortness of breath.  ? ARIPiprazole (ABILIFY) 10 MG tablet Take 1 tablet (10 mg total) by mouth daily.  ? aspirin EC 81 MG tablet Take 81 mg by mouth at bedtime. Swallow whole.  ? atorvastatin (LIPITOR) 20 MG tablet Take 1 tablet (20 mg total) by mouth daily.  ? Azelastine-Fluticasone 137-50 MCG/ACT SUSP Use both nostrils as directed twice daily  ? budesonide-formoterol (SYMBICORT) 160-4.5 MCG/ACT inhaler Inhale 2 puffs into the lungs 2 (two) times daily.  ? calcium carbonate (TUMS - DOSED IN MG ELEMENTAL CALCIUM) 500 MG chewable tablet Chew 1 tablet by mouth 3 (three) times daily with meals.  ? celecoxib (CELEBREX) 200 MG capsule Take 1 capsule (200 mg total) by mouth 2 (two) times daily.  ? cetirizine (ZYRTEC) 10 MG tablet Take 1 tablet (10 mg total) by mouth daily.  ? Cholecalciferol (VITAMIN D3 PO) Take 2 tablets by mouth at bedtime.  ? diclofenac Sodium (VOLTAREN) 1 % GEL Apply 4 g topically 3 (three) times daily as needed (pain).  ? donepezil (ARICEPT) 10 MG tablet Take 10 mg by mouth at bedtime.  ? donepezil (ARICEPT) 5 MG tablet Take 1 tablet (5 mg total) by mouth at bedtime.  ? DULoxetine (CYMBALTA) 20 MG capsule Take 1 capsule (20 mg total) by mouth 2 (two) times daily.  ? Ferrous Sulfate (IRON PO) Take by mouth.  ? gabapentin (NEURONTIN) 800 MG tablet Take 800 mg by mouth 3 (three) times daily.   ? hydrochlorothiazide (HYDRODIURIL) 25 MG tablet Take 1 tablet (25 mg total) by mouth daily.  ? HYDROcodone-acetaminophen (NORCO) 10-325 MG tablet Take 1 tablet by mouth every 6 (six) hours as needed.  ? losartan (COZAAR) 50 MG tablet Take 1 tablet (50 mg total) by mouth daily.  ? methocarbamol (ROBAXIN) 500 MG tablet TAKE ONE TABLET BY MOUTH EVERY EIGHT HOURS AS NEEDED musle SPASMS  ? MYRBETRIQ 50 MG TB24 tablet Take 50 mg by mouth at bedtime.  ? Omega-3 Fatty Acids (FISH OIL) 1000 MG CAPS Take 2,000 mg by mouth.  ? Pancrelipase, Lip-Prot-Amyl, (ZENPEP) 40000-126000 units CPEP Take 2 capsules (80,000 units) with meals and 1 capsule (40,000 units) with each snack (2-3 snacks/day)  ? prasugrel (EFFIENT) 5 MG TABS tablet Take 1 tablet (5 mg total) by mouth daily.  ? Semaglutide,0.25 or 0.5MG/DOS, (OZEMPIC, 0.25 OR 0.5 MG/DOSE,) 2 MG/1.5ML SOPN Inject 0.5 mg into the skin once a week.  ? tamsulosin (FLOMAX) 0.4 MG CAPS capsule Take 0.4 mg by mouth 2 (two) times daily.  ? Testosterone 1.62 % GEL Place 1 Pump onto the skin daily.  ? traZODone (DESYREL) 150 MG tablet Take 1 tablet (150 mg total) by mouth at bedtime.  ? zolpidem (AMBIEN) 5 MG tablet Take 1 tablet (5 mg total) by mouth at bedtime as needed for sleep. (Patient taking differently: Take 10 mg by mouth at bedtime as needed for sleep.)  ? ?No facility-administered encounter medications on  file as of 09/09/2021.  ? ?Have you had any problems recently with your health?Patient states that he has bee doing well and does not have any complaints about health or medications ? ?Have you had any problems with your pharmacy?Patient states that he does not have any problems with getting medications from the pharmacy or the cost ? ?What issues or side effects are you having with your medications?Patient states that he is not having any side effects from medications ? ?What would you like me to pass along to [facility] for them to help you with?Patient states that he  is doing well ?  ?What can we do to take care of you better? Patient states not at this time ? ?Care Gaps: ?Colonoscopy-11/19/18 ?Diabetic Foot Exam-09/17/20 ?Ophthalmology-11/30/20 ?Dexa Scan - NA ?Annual Well Visit - NA ?Micro albumin-06/14/21 ?Hemoglobin A1c- 06/14/21 ? ?Star Rating Drugs: ? ? ?Ethelene Hal ?Clinical Pharmacist Assistant ?[phone]  ?

## 2021-09-11 DIAGNOSIS — R404 Transient alteration of awareness: Secondary | ICD-10-CM | POA: Diagnosis not present

## 2021-09-11 DIAGNOSIS — R0902 Hypoxemia: Secondary | ICD-10-CM | POA: Diagnosis not present

## 2021-09-11 DIAGNOSIS — R569 Unspecified convulsions: Secondary | ICD-10-CM | POA: Diagnosis not present

## 2021-09-12 ENCOUNTER — Telehealth: Payer: Self-pay | Admitting: Neurology

## 2021-09-12 NOTE — Telephone Encounter (Signed)
Called the pt back. Based off the last ov she had recommended the dose increase but pt should follow up with pcp about making the increase and getting the script since they initially ordered it. Pt verbalized understanding and wasn't aware of needing to get from him. He advised he would contact PCP office.  ?

## 2021-09-12 NOTE — Telephone Encounter (Signed)
Pt called stating Dr. Brett Fairy asked pt to take 74m of donepezil (ARICEPT) 5 MG tablet that is prescribed by PCP instead of 510m Pharmacy ([facility] told pt [facility] 1011[facility].  ?

## 2021-09-13 ENCOUNTER — Telehealth (INDEPENDENT_AMBULATORY_CARE_PROVIDER_SITE_OTHER): Payer: Medicare Other | Admitting: Psychology

## 2021-09-13 DIAGNOSIS — F3289 Other specified depressive episodes: Secondary | ICD-10-CM | POA: Diagnosis not present

## 2021-09-14 ENCOUNTER — Other Ambulatory Visit: Payer: Self-pay | Admitting: Internal Medicine

## 2021-09-14 DIAGNOSIS — E291 Testicular hypofunction: Secondary | ICD-10-CM

## 2021-09-19 ENCOUNTER — Other Ambulatory Visit: Payer: Self-pay | Admitting: Internal Medicine

## 2021-09-20 NOTE — Progress Notes (Signed)
?Office: [phone]  /  Fax: [phone] ? ? ? ?Date: 10/04/2021   ?Appointment Start Time: 12:00pm ?Duration: 28 minutes ?Provider: Glennie Isle, Psy.D. ?Type of Session: Individual Therapy  ?Location of Patient: Home (private location) ?Location of Provider: Provider's Home (private office) ?Type of Contact: Telepsychological Visit via MyChart Video Visit ? ?Session Content: Travis [name] is a 71 y.o. male presenting for a follow-up appointment to address the previously established treatment goal of increasing coping skills.Today's appointment was a telepsychological visit due to COVID-19. Travis [name] provided verbal consent for today's telepsychological appointment and he is aware he is responsible for securing confidentiality on his end of the session. Prior to proceeding with today's appointment, Travis [name]'s physical location at the time of this appointment was obtained as well a phone number he could be reached at in the event of technical difficulties. Travis [name] and this provider participated in today's telepsychological service.  ? ?Per [facility] new policy, Travis [name] will be e-mailed two additional forms (AOB and Receipt of NPP) to sign as well as Travis [name]'s Notice of Scientist, research (physical sciences). The content of the documents were explained to Travis [name] at the onset of the appointment, and he agreed to proceed. ? ?This provider conducted a brief check-in. Travis [name] shared his trip was "wonderful," adding he is already planning his next visit. He stated he is starting to feel sick "every time" he eats, which started when he obtained his new top denture on May 9th. Further explored and processed. Travis [name] feels it is his "gag reflex." He was encouraged to reach out to his PCP. Travis [name] agreed, noting he has an appointment at 2pm today. Despite challenges with eating, he indicated he continues to be mindful about his protein intake and making better choices. He also discussed making better choices and engaging in portion control while on  vacation. Positive reinforcement was provided. Travis [name] continues to report a reduction in emotional eating behaviors. Travis [name] was engaged in problem solving to develop a plan to further help cope with urges/cravings in the future involving activities to relax, activities to distract, comforting places, people to call and connect with, and activities that help soothe senses. He was observed writing the plan. Furthermore, termination planning was discussed. Travis [name] was receptive to a follow-up appointment in 3-4 weeks and an additional follow-up/termination appointment in 3-4 weeks after that. Overall, Travis [name] was receptive to today's appointment as evidenced by openness to sharing, responsiveness to feedback, and willingness to implement discussed strategies . ? ?Mental Status Examination:  ?Appearance: neat ?Behavior: appropriate to circumstances ?Mood: neutral ?Affect: mood congruent ?Speech: WNL ?Eye Contact: appropriate ?Psychomotor Activity: WNL ?Gait: unable to assess ?Thought Process: linear, logical, and goal directed and no evidence or endorsement of suicidal, homicidal, and self-harm ideation, plan and intent  ?Thought Content/Perception: no hallucinations, delusions, bizarre thinking or behavior endorsed or observed ?Orientation: AAOx4 ?Memory/Concentration: memory, attention, language, and fund of knowledge intact  ?Insight: good ?Judgment: good ? ?Interventions:  ?Conducted a brief chart review ?Provided empathic reflections and validation ?Employed supportive psychotherapy interventions to facilitate reduced distress and to improve coping skills with identified stressors ?Engaged patient in problem solving ?Discussed termination planning ? ?DSM-5 Diagnosis(es): F32.89 Other Specified Depressive Disorder, Emotional and Binge Eating Behaviors ? ?Treatment Goal & Progress: During the initial appointment with this provider, the following treatment goal was established: increase coping skills. Travis [name] has  demonstrated progress in his goal as evidenced by increased awareness of hunger patterns, increased awareness of triggers for emotional eating behaviors, and reduction in emotional eating behaviors . Travis [name] also  continues to demonstrate willingness to engage in learned skill(s). ? ?Plan: The next appointment is scheduled for 10/31/2021 at 10am, which will be via MyChart Video Visit. The next session will focus on working towards the established treatment goal. Travis [name] will continue to meet with his primary therapist, noting their next appointment is June 1st. Additionally, Travis [name] will be e-mailed the discussed forms. He noted a plan to complete them in-person during his appointment with [name] on May 30th as he does not have access to a printer.  ? ?

## 2021-09-26 ENCOUNTER — Telehealth: Payer: Self-pay | Admitting: Internal Medicine

## 2021-09-26 DIAGNOSIS — R3915 Urgency of urination: Secondary | ICD-10-CM | POA: Diagnosis not present

## 2021-09-26 NOTE — Telephone Encounter (Signed)
Patient has an appointment with you on 10/04/2021.  He would like enough of his zolpiden sent to [facility] on [address] to get him to this appointment. ?

## 2021-09-27 DIAGNOSIS — E119 Type 2 diabetes mellitus without complications: Secondary | ICD-10-CM | POA: Diagnosis not present

## 2021-09-27 DIAGNOSIS — M47818 Spondylosis without myelopathy or radiculopathy, sacral and sacrococcygeal region: Secondary | ICD-10-CM | POA: Diagnosis not present

## 2021-09-27 DIAGNOSIS — Z01812 Encounter for preprocedural laboratory examination: Secondary | ICD-10-CM | POA: Diagnosis not present

## 2021-09-27 DIAGNOSIS — Z7985 Long-term (current) use of injectable non-insulin antidiabetic drugs: Secondary | ICD-10-CM | POA: Diagnosis not present

## 2021-09-27 MED ORDER — ZOLPIDEM TARTRATE 5 MG PO TABS: 5.0000 mg | ORAL_TABLET | Freq: Every evening | ORAL | 0 refills | Status: DC | PRN

## 2021-09-27 NOTE — Telephone Encounter (Signed)
Done erx 

## 2021-09-28 ENCOUNTER — Telehealth (HOSPITAL_BASED_OUTPATIENT_CLINIC_OR_DEPARTMENT_OTHER): Payer: Medicare Other | Admitting: Psychiatry

## 2021-09-28 ENCOUNTER — Encounter (HOSPITAL_COMMUNITY): Payer: Self-pay | Admitting: Psychiatry

## 2021-09-28 DIAGNOSIS — J343 Hypertrophy of nasal turbinates: Secondary | ICD-10-CM | POA: Diagnosis not present

## 2021-09-28 DIAGNOSIS — F319 Bipolar disorder, unspecified: Secondary | ICD-10-CM

## 2021-09-28 DIAGNOSIS — F419 Anxiety disorder, unspecified: Secondary | ICD-10-CM

## 2021-09-28 DIAGNOSIS — R0600 Dyspnea, unspecified: Secondary | ICD-10-CM | POA: Diagnosis not present

## 2021-09-28 DIAGNOSIS — J324 Chronic pansinusitis: Secondary | ICD-10-CM | POA: Diagnosis not present

## 2021-09-28 DIAGNOSIS — J9620 Acute and chronic respiratory failure, unspecified whether with hypoxia or hypercapnia: Secondary | ICD-10-CM | POA: Diagnosis not present

## 2021-09-28 MED ORDER — ARIPIPRAZOLE 10 MG PO TABS: 10.0000 mg | ORAL_TABLET | Freq: Every day | ORAL | 0 refills | Status: DC

## 2021-09-28 MED ORDER — TRAZODONE HCL 100 MG PO TABS: 100.0000 mg | ORAL_TABLET | Freq: Every day | ORAL | 0 refills | Status: DC

## 2021-09-28 MED ORDER — DULOXETINE HCL 20 MG PO CPEP: 20.0000 mg | ORAL_CAPSULE | Freq: Two times a day (BID) | ORAL | 0 refills | Status: DC

## 2021-09-28 NOTE — Progress Notes (Signed)
Virtual Visit via Telephone Note ? ?I connected with Travis Novak Sr. on 09/28/21 at  9:20 AM EDT by telephone and verified that I am speaking with the correct person using two identifiers. ? ?Location: ?Patient: Home ?Provider: Home Office ?  ?I discussed the limitations, risks, security and privacy concerns of performing an evaluation and management service by telephone and the availability of in person appointments. I also discussed with the patient that there may be a patient responsible charge related to this service. The patient expressed understanding and agreed to proceed. ? ? ?History of Present Illness: ?Patient is evaluated by phone session.  He is doing better.  Recently he had a trip to [place] to see his family.  He was very happy to see his new grandchild who is now 76 months old.  Patient stayed there for 1 week.  He reported his mood swing irritability is much better.  He admitted excessive spending because he was going to [place] and spend money on T-shirts for everyone.  He denies any nightmares, flashback.  He sleeps good.  He is keeping appointment with all his provider.  He lost 10 pounds since the last visit because he is using Ozempic and his last hemoglobin A1c is 5.6.  He had a good support system.  He has patient care advocate who comes almost every day and he is excited about going to start gym next week.  He also has very good male friend and her husband who helps when he needed.  Patient told apartment complex has shuttle that takes to be grocery stores and that is very helpful.  He is taking multiple medications including pain medication, gabapentin and Ambien.  We talk about polypharmacy.  He denies any panic attack.  He has no tremor or shakes or any EPS.  Denies any paranoia, hallucination.  Denies drinking or using any illegal substances. ? ?Past Psychiatric History:  ?Saw psychiatrist in [place] and [place].  H/O mood swings, suicidal gesture, anger, hallucination,  irritability and increase spending. Married 5 times. H/O inpatient in 2014 due to overdose on Xanax and alcohol.  Did IOP. Took Effexor, Xanax, Klonopin, Ambien, Lunesta, Wellbutrin, Ambien, Remeron and Prozac. Abilify started in our office.  ? ?No results found for this or any previous visit (from the past 2160 hour(s)).  ?  ?Psychiatric Specialty Exam: ?Physical Exam  ?Review of Systems  ?Weight 270 lb (122.5 kg).There is no height or weight on file to calculate BMI.  ?General Appearance: NA  ?Eye Contact:  NA  ?Speech:  Clear and Coherent and Normal Rate  ?Volume:  Normal  ?Mood:  Euthymic  ?Affect:  NA  ?Thought Process:  Goal Directed  ?Orientation:  Full (Time, Place, and Person)  ?Thought Content:  Logical  ?Suicidal Thoughts:  No  ?Homicidal Thoughts:  No  ?Memory:  Immediate;   Fair ?Recent;   Fair ?Remote;   Fair  ?Judgement:  Intact  ?Insight:  Present  ?Psychomotor Activity:  NA  ?Concentration:  Concentration: Fair and Attention Span: Fair  ?Recall:  Fair  ?Fund of Knowledge:  Good  ?Language:  Good  ?Akathisia:  No  ?Handed:  Right  ?AIMS (if indicated):     ?Assets:  Communication Skills ?Desire for Improvement ?Social Support  ?ADL's:  Intact  ?Cognition:  WNL  ?Sleep:   ok  ? ? ? ? ?Assessment and Plan: ?Bipolar disorder type I.  Anxiety. ? ?I reviewed blood work results from [facility] in [place].  His  last hemoglobin A1c which was done yesterday is 5.6 and stable.  He is doing better with a good support system.  We talk about reducing the medication since he has been stable and is still taking Ambien, gabapentin, Percocet and muscle relaxant.  After some discussion he agreed to reducing trazodone to 100 mg at bedtime.  We will continue Abilify 5 mg daily, Cymbalta 20 mg twice a day and reduce trazodone to 100 mg at bedtime.  I encouraged to give [place] a call back if he feels symptoms are coming back.  Discussed medication side effects and benefits.  Recommended to call us back if is any question or  any concern.  Follow-up in 3 months. ? ?Follow Up Instructions: ? ?  ?I discussed the assessment and treatment plan with the patient. The patient was provided an opportunity to ask questions and all were answered. The patient agreed with the plan and demonstrated an understanding of the instructions. ?  ?The patient was advised to call back or seek an in-person evaluation if the symptoms worsen or if the condition fails to improve as anticipated. ? ?Collaboration of Care: Primary Care Provider AEB notes are available in epic to review. ? ?Patient/Guardian was advised Release of Information must be obtained prior to any record release in order to collaborate their care with an outside provider. Patient/Guardian was advised if they have not already done so to contact the registration department to sign all necessary forms in order for [place] to release information regarding their care.  ? ?Consent: Patient/Guardian gives verbal consent for treatment and assignment of benefits for services provided during this visit. Patient/Guardian expressed understanding and agreed to proceed.   ? ?I provided 27 minutes of non-face-to-face time during this encounter. ? ? ?Kathlee Nations, MD  ?

## 2021-09-29 ENCOUNTER — Ambulatory Visit (INDEPENDENT_AMBULATORY_CARE_PROVIDER_SITE_OTHER): Payer: Medicare Other | Admitting: Family Medicine

## 2021-09-29 DIAGNOSIS — I10 Essential (primary) hypertension: Secondary | ICD-10-CM | POA: Diagnosis not present

## 2021-09-29 DIAGNOSIS — R569 Unspecified convulsions: Secondary | ICD-10-CM | POA: Diagnosis not present

## 2021-10-04 ENCOUNTER — Ambulatory Visit (INDEPENDENT_AMBULATORY_CARE_PROVIDER_SITE_OTHER): Payer: Medicare Other | Admitting: Internal Medicine

## 2021-10-04 ENCOUNTER — Telehealth (INDEPENDENT_AMBULATORY_CARE_PROVIDER_SITE_OTHER): Payer: Medicare Other | Admitting: Psychology

## 2021-10-04 ENCOUNTER — Other Ambulatory Visit (INDEPENDENT_AMBULATORY_CARE_PROVIDER_SITE_OTHER): Payer: Self-pay | Admitting: Family Medicine

## 2021-10-04 ENCOUNTER — Other Ambulatory Visit: Payer: Self-pay | Admitting: Internal Medicine

## 2021-10-04 ENCOUNTER — Encounter: Payer: Self-pay | Admitting: Internal Medicine

## 2021-10-04 VITALS — BP 118/60 | HR 52 | Temp 98.2°F | Ht 70.0 in | Wt 265.0 lb

## 2021-10-04 DIAGNOSIS — L84 Corns and callosities: Secondary | ICD-10-CM

## 2021-10-04 DIAGNOSIS — J309 Allergic rhinitis, unspecified: Secondary | ICD-10-CM | POA: Diagnosis not present

## 2021-10-04 DIAGNOSIS — L739 Follicular disorder, unspecified: Secondary | ICD-10-CM | POA: Diagnosis not present

## 2021-10-04 DIAGNOSIS — I739 Peripheral vascular disease, unspecified: Secondary | ICD-10-CM | POA: Diagnosis not present

## 2021-10-04 DIAGNOSIS — E669 Obesity, unspecified: Secondary | ICD-10-CM

## 2021-10-04 DIAGNOSIS — E1169 Type 2 diabetes mellitus with other specified complication: Secondary | ICD-10-CM | POA: Diagnosis not present

## 2021-10-04 DIAGNOSIS — Z89511 Acquired absence of right leg below knee: Secondary | ICD-10-CM

## 2021-10-04 DIAGNOSIS — F3289 Other specified depressive episodes: Secondary | ICD-10-CM | POA: Diagnosis not present

## 2021-10-04 DIAGNOSIS — I10 Essential (primary) hypertension: Secondary | ICD-10-CM | POA: Diagnosis not present

## 2021-10-04 DIAGNOSIS — E1165 Type 2 diabetes mellitus with hyperglycemia: Secondary | ICD-10-CM

## 2021-10-04 MED ORDER — DOXYCYCLINE HYCLATE 100 MG PO TABS: 100.0000 mg | ORAL_TABLET | Freq: Two times a day (BID) | ORAL | 0 refills | Status: DC

## 2021-10-04 MED ORDER — METHYLPREDNISOLONE ACETATE 80 MG/ML IJ SUSP
80.0000 mg | Freq: Once | INTRAMUSCULAR | Status: AC
  Administered 2021-10-04: 80 mg via INTRAMUSCULAR

## 2021-10-04 NOTE — Telephone Encounter (Signed)
Please refill as per office routine med refill policy (all routine meds to be refilled for 3 mo or monthly (per pt preference) up to one year from last visit, then month to month grace period for 3 mo, then further med refills will have to be denied) ? ?
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
## 2021-10-04 NOTE — Patient Instructions (Addendum)
You had the steroid shot today ? ?Please take all new medication as prescribed - the prednisone, and the antibiotic ? ?Please continue all other medications as before, and refills have been done if requested. ? ?Please have the pharmacy call with any other refills you may need. ? ?Please continue your efforts at being more active, low cholesterol diet, and weight control. ? ?Please keep your appointments with your specialists as you may have planned ? ?You will be contacted regarding the referral for: podiatry, and left leg circulation testing ? ?Please make an Appointment to return in 6 months, or sooner if needed ?

## 2021-10-06 ENCOUNTER — Telehealth: Payer: Self-pay | Admitting: Internal Medicine

## 2021-10-06 DIAGNOSIS — L84 Corns and callosities: Secondary | ICD-10-CM | POA: Insufficient documentation

## 2021-10-06 DIAGNOSIS — L739 Follicular disorder, unspecified: Secondary | ICD-10-CM | POA: Insufficient documentation

## 2021-10-06 NOTE — Assessment & Plan Note (Signed)
Mild to mod, for antibx course,  to f/u any worsening symptoms or concerns 

## 2021-10-06 NOTE — Telephone Encounter (Signed)
Clinic received notes from visit on 10/04/2020 but they did not state in the notes that foot examination was none.  Notes will need to be amended to state this and be resent.  Fax:  [phone]

## 2021-10-06 NOTE — Assessment & Plan Note (Signed)
BP Readings from Last 3 Encounters:  10/04/21 118/60  08/17/21 (!) 149/81  08/09/21 105/61   Stable, pt to continue medical treatment losartan

## 2021-10-06 NOTE — Assessment & Plan Note (Signed)
Mild to mod, for depomedrol im 80, prednisone asd,  to f/u any worsening symptoms or concerns

## 2021-10-06 NOTE — Assessment & Plan Note (Signed)
Also for referral podiatry

## 2021-10-06 NOTE — Assessment & Plan Note (Signed)
Lab Results  Component Value Date   HGBA1C 5.5 06/14/2021   Stable, pt to continue current medical treatment  - ozempic

## 2021-10-06 NOTE — Telephone Encounter (Signed)
Notes have been faxed to [facility]

## 2021-10-06 NOTE — Assessment & Plan Note (Signed)
Cant r/o worsening to LLE - for arterial study

## 2021-10-06 NOTE — Telephone Encounter (Signed)
Travis [name] is requesting that notes from service date be faxed to [phone] in regards to diabetic exam.   Please send ASAP.

## 2021-10-08 ENCOUNTER — Other Ambulatory Visit (INDEPENDENT_AMBULATORY_CARE_PROVIDER_SITE_OTHER): Payer: Self-pay | Admitting: Family Medicine

## 2021-10-08 DIAGNOSIS — E1165 Type 2 diabetes mellitus with hyperglycemia: Secondary | ICD-10-CM

## 2021-10-10 ENCOUNTER — Telehealth (INDEPENDENT_AMBULATORY_CARE_PROVIDER_SITE_OTHER): Payer: Self-pay | Admitting: Family Medicine

## 2021-10-10 ENCOUNTER — Other Ambulatory Visit (INDEPENDENT_AMBULATORY_CARE_PROVIDER_SITE_OTHER): Payer: Self-pay

## 2021-10-10 DIAGNOSIS — M961 Postlaminectomy syndrome, not elsewhere classified: Secondary | ICD-10-CM | POA: Diagnosis not present

## 2021-10-10 DIAGNOSIS — E1165 Type 2 diabetes mellitus with hyperglycemia: Secondary | ICD-10-CM

## 2021-10-10 DIAGNOSIS — M533 Sacrococcygeal disorders, not elsewhere classified: Secondary | ICD-10-CM | POA: Diagnosis not present

## 2021-10-10 DIAGNOSIS — G894 Chronic pain syndrome: Secondary | ICD-10-CM | POA: Diagnosis not present

## 2021-10-10 MED ORDER — OZEMPIC (0.25 OR 0.5 MG/DOSE) 2 MG/1.5ML ~~LOC~~ SOPN: 0.5000 mg | PEN_INJECTOR | SUBCUTANEOUS | 0 refills | Status: DC

## 2021-10-10 NOTE — Telephone Encounter (Signed)
Spoke w/ pt-CS

## 2021-10-10 NOTE — Telephone Encounter (Signed)
Pt calling stating he is having difficulties with his pharmacy refilling his ozempic rx. The best pharmacy is [facility], [place]. Pharmacy stated "the provider noted that this refill was not necessary at this time" and wanted clarification on what was going on. The best call back number is [phone] or mychart message.   Pt next appt with Dr. Jeani Sow 10/18/21.

## 2021-10-10 NOTE — Telephone Encounter (Signed)
Dr.Ukleja 

## 2021-10-11 ENCOUNTER — Telehealth: Payer: Self-pay | Admitting: Internal Medicine

## 2021-10-11 MED ORDER — ZOLPIDEM TARTRATE 10 MG PO TABS: 10.0000 mg | ORAL_TABLET | Freq: Every evening | ORAL | 0 refills | Status: DC | PRN

## 2021-10-11 NOTE — Telephone Encounter (Signed)
Ok I corrected ambien to 10 mg

## 2021-10-11 NOTE — Telephone Encounter (Signed)
Pt called in and states he has been on zolpidem (AMBIEN) 5 MG tablet at 34m dosage for a long time.   States he is not able to sleep, still and wants to know why dosage was decreased.   Also, requesting for dosage to go back up to 167m   Please call pt once this has been done.

## 2021-10-11 NOTE — Telephone Encounter (Signed)
Called pt no answer LMOM rx sent to [facility].../lmb 

## 2021-10-17 DIAGNOSIS — R569 Unspecified convulsions: Secondary | ICD-10-CM | POA: Diagnosis not present

## 2021-10-18 ENCOUNTER — Ambulatory Visit (INDEPENDENT_AMBULATORY_CARE_PROVIDER_SITE_OTHER): Payer: Medicare Other | Admitting: Family Medicine

## 2021-10-18 ENCOUNTER — Encounter (INDEPENDENT_AMBULATORY_CARE_PROVIDER_SITE_OTHER): Payer: Self-pay | Admitting: Family Medicine

## 2021-10-18 VITALS — BP 118/70 | HR 58 | Temp 98.1°F | Ht 70.0 in | Wt 257.0 lb

## 2021-10-18 DIAGNOSIS — Z7985 Long-term (current) use of injectable non-insulin antidiabetic drugs: Secondary | ICD-10-CM

## 2021-10-18 DIAGNOSIS — E669 Obesity, unspecified: Secondary | ICD-10-CM

## 2021-10-18 DIAGNOSIS — I152 Hypertension secondary to endocrine disorders: Secondary | ICD-10-CM | POA: Diagnosis not present

## 2021-10-18 DIAGNOSIS — E1165 Type 2 diabetes mellitus with hyperglycemia: Secondary | ICD-10-CM | POA: Diagnosis not present

## 2021-10-18 DIAGNOSIS — Z6836 Body mass index (BMI) 36.0-36.9, adult: Secondary | ICD-10-CM

## 2021-10-18 DIAGNOSIS — E1159 Type 2 diabetes mellitus with other circulatory complications: Secondary | ICD-10-CM | POA: Diagnosis not present

## 2021-10-18 NOTE — Progress Notes (Signed)
Office: [phone]  /  Fax: [phone]    Date: 10/31/2021   Appointment Start Time: 10:00am Duration: 25 minutes Provider: Glennie Isle, Psy.D. Type of Session: Individual Therapy  Location of Patient: Home (private location) Location of Provider: Provider's Home (private office) Type of Contact: Telepsychological Visit via MyChart Video Visit  Session Content: Travis [name] is a 71 y.o. male presenting for a follow-up appointment to address the previously established treatment goal of increasing coping skills.Today's appointment was a telepsychological visit due to COVID-19. [name] provided verbal consent for today's telepsychological appointment and he is aware he is responsible for securing confidentiality on his end of the session. Prior to proceeding with today's appointment, [name]'s physical location at the time of this appointment was obtained as well a phone number he could be reached at in the event of technical difficulties. [name] and this provider participated in today's telepsychological service.   This provider conducted a brief check-in. [name] noted, "I don't know why I'm gaining weight." He reported he weighed himself yesterday, adding, "I'm back to eating hot dogs. Pop corn. I know I shouldn't be doing that." He acknowledged this has occurred in the past when losing weight. Further explored and processed. [name] acknowledged he is bored with some of the food options on his plan and also discussed eating certain foods out of convenience due to ongoing pain. He was engaged in problem solving to help him get back on track with his structured meal plan until his next appointment with [name] on November 14, 2021. Moreover, a risk assessment was completed due to ongoing pain. [name] acknowledged having the thought, "I should just give up" on Saturday morning secondary to intense physical back pain. He explained he was feeling a bit better on Friday; therefore, "went out dancing"  which exacerbated his back related concerns. He acknowledged it was a fleeting thought, adding he started to immediately focus on people worth living for (e.g., children, grandchildren, great grandchildren). He denied experiencing suicidal plan and intent. Cage added, "I'm enjoying life other than the pain." [name] denied current experiencing suicidal, self-harm and homicidal ideation, plan, and intent. He continues to acknowledge understanding regarding the importance of reaching out to trusted individuals and/or emergency resources if he is unable to ensure safety. [name] agreed to also contact his pain doctor to discuss ongoing pain related concerns. His confidence in his ability to keep himself safe was assessed on a scale of 1 to 10 (1= not confident; 10= extremely confident). He noted his confidence as "10." Overall, [name] was receptive to today's appointment as evidenced by openness to sharing, responsiveness to feedback, and willingness to implement discussed strategies .  Mental Status Examination:  Appearance: neat Behavior: appropriate to circumstances Mood: sad Affect: mood congruent Speech: WNL Eye Contact: appropriate Psychomotor Activity: WNL Gait: unable to assess Thought Process: linear, logical, and goal directed and denies suicidal, homicidal, and self-harm ideation, plan and intent  Thought Content/Perception: no hallucinations, delusions, bizarre thinking or behavior endorsed or observed Orientation: AAOx4 Memory/Concentration: memory, attention, language, and fund of knowledge intact  Insight: good Judgment: fair  Interventions:  Conducted a brief chart review Conducted a risk assessment Provided empathic reflections and validation Employed supportive psychotherapy interventions to facilitate reduced distress and to improve coping skills with identified stressors Engaged patient in problem solving Recommended/discussed option for longer-term therapeutic  services  DSM-5 Diagnosis(es): F32.89 Other Specified Depressive Disorder, Emotional and Binge Eating Behaviors  Treatment Goal & Progress: During the initial appointment with this provider, the following  treatment goal was established: increase coping skills. [name] has demonstrated progress in his goal as evidenced by increased awareness of hunger patterns, increased awareness of triggers for emotional eating behaviors, and reduction in emotional eating behaviors . [name] also continues to demonstrate willingness to engage in learned skill(s).  Plan: The next appointment is scheduled for 11/15/2021 at 2pm, which will be via [place] Visit. The next session will focus on working towards the established treatment goal. [name] requested referrals for therapeutic services as he plans to discontinue with his current primary therapist. A such, he provided verbal consent for this provider to e-mail referrals and agreed to establish care with a new provider.

## 2021-10-19 ENCOUNTER — Ambulatory Visit: Payer: Medicare Other | Admitting: Podiatry

## 2021-10-19 ENCOUNTER — Other Ambulatory Visit: Payer: Self-pay | Admitting: Internal Medicine

## 2021-10-20 ENCOUNTER — Ambulatory Visit (HOSPITAL_COMMUNITY)
Admission: RE | Admit: 2021-10-20 | Discharge: 2021-10-20 | Disposition: A | Discharge status: Home / Self Care | Payer: Medicare Other | Source: Ambulatory Visit | Attending: Internal Medicine | Admitting: Internal Medicine

## 2021-10-20 ENCOUNTER — Ambulatory Visit (INDEPENDENT_AMBULATORY_CARE_PROVIDER_SITE_OTHER): Payer: Medicare Other | Admitting: Psychologist

## 2021-10-20 DIAGNOSIS — E669 Obesity, unspecified: Secondary | ICD-10-CM | POA: Diagnosis not present

## 2021-10-20 DIAGNOSIS — I739 Peripheral vascular disease, unspecified: Secondary | ICD-10-CM

## 2021-10-20 DIAGNOSIS — Z634 Disappearance and death of family member: Secondary | ICD-10-CM | POA: Diagnosis not present

## 2021-10-20 DIAGNOSIS — F331 Major depressive disorder, recurrent, moderate: Secondary | ICD-10-CM | POA: Diagnosis not present

## 2021-10-20 DIAGNOSIS — E1169 Type 2 diabetes mellitus with other specified complication: Secondary | ICD-10-CM | POA: Diagnosis not present
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
## 2021-10-25 DIAGNOSIS — E1159 Type 2 diabetes mellitus with other circulatory complications: Secondary | ICD-10-CM | POA: Insufficient documentation

## 2021-10-25 NOTE — Progress Notes (Signed)
Chief Complaint:   OBESITY Travis [name] is here to discuss his progress with his obesity treatment plan along with follow-up of his obesity related diagnoses. Travis [name] is on the Category 4 Plan and states he is following his eating plan approximately 80% of the time. Travis [name] states he is not exercising.  Today's visit was #: 9 Starting weight: 302 lbs Starting date: 04/06/2021 Today's weight: 257 lbs Today's date: 10/18/2021 Total lbs lost to date: 45 lbs Total lbs lost since last in-office visit: 15 lbs  Interim History: Travis [name]'s sinuses and allergies have been causing significant life disruptions recently.  He has significant coughing and has no food taste.  He has been able to get all food in frequently.  He bought a new grill and has been grilling more.  He is using 80/20 burgers.  Has a good amount of meat at home.   Subjective:   1. Hypertension associated with type 2 diabetes mellitus (Travis [name]) Travis [name]'s blood pressure is well controlled today.  He denies any chest pain, chest pressure or headaches.  2. Type 2 diabetes mellitus with hyperglycemia, without long-term current use of insulin (Travis [name]) Travis [name] is on Ozempic weekly.  His last A1c is 5.5.  He is doing very well on Ozempic.   Assessment/Plan:   1. Hypertension associated with type 2 diabetes mellitus (Travis [name]) Travis [name] has agreed to continue losartan, HCTZ, no change in dose.   2. Type 2 diabetes mellitus with hyperglycemia, without long-term current use of insulin (Travis [name]) Good blood sugar control is important to decrease the likelihood of diabetic complications such as nephropathy, neuropathy, limb loss, blindness, coronary artery disease, and death. Intensive lifestyle modification including diet, exercise and weight loss are the first line of treatment for diabetes.   Continue Ozempic no change in dose.   3. Obesity with current BMI of 36.88 Travis [name] is currently in the action stage of change. As such, his goal is to continue  with weight loss efforts. He has agreed to the Category 4 Plan.   Exercise goals: All adults should avoid inactivity. Some physical activity is better than none, and adults who participate in any amount of physical activity gain some health benefits.  Behavioral modification strategies: increasing lean protein intake, meal planning and cooking strategies, keeping healthy foods in the home, and planning for success.  Travis [name] has agreed to follow-up with our clinic in 4 weeks. He was informed of the importance of frequent follow-up visits to maximize his success with intensive lifestyle modifications for his multiple health conditions.   Objective:   Blood pressure 118/70, pulse (!) 58, temperature 98.1 F (36.7 C), height 5' 10"  (1.778 m), weight 257 lb (116.6 kg), SpO2 96 %. Body mass index is 36.88 kg/m.  General: Cooperative, alert, well developed, in no acute distress. HEENT: Conjunctivae and lids unremarkable. Cardiovascular: Regular rhythm.  Lungs: Normal work of breathing. Neurologic: No focal deficits.   Lab Results  Component Value Date   CREATININE 0.89 06/14/2021   BUN 14 06/14/2021   NA 139 06/14/2021   K 4.0 06/14/2021   CL 103 06/14/2021   CO2 28 06/14/2021   Lab Results  Component Value Date   ALT 11 06/14/2021   AST 17 06/14/2021   ALKPHOS 58 06/14/2021   BILITOT 0.3 06/14/2021   Lab Results  Component Value Date   HGBA1C 5.5 06/14/2021   HGBA1C 5.6 04/11/2021   HGBA1C 5.8 (H) 03/09/2021   HGBA1C 6.1 12/01/2020   HGBA1C 5.7 (A) 02/24/2020   Lab  Results  Component Value Date   INSULIN 12.2 04/11/2021   Lab Results  Component Value Date   TSH 2.63 06/14/2021   Lab Results  Component Value Date   CHOL 129 06/14/2021   HDL 48.10 06/14/2021   LDLCALC 60 06/14/2021   TRIG 104.0 06/14/2021   CHOLHDL 3 06/14/2021   Lab Results  Component Value Date   VD25OH 40.99 06/14/2021   VD25OH 51.0 04/11/2021   VD25OH 53.07 12/01/2020   Lab Results   Component Value Date   WBC 3.7 (L) 06/14/2021   HGB 12.3 (L) 06/14/2021   HCT 37.6 (L) 06/14/2021   MCV 88.1 06/14/2021   PLT 219.0 06/14/2021   Lab Results  Component Value Date   IRON 49 12/01/2020   TIBC 186 (L) 10/18/2014   FERRITIN 25.6 12/01/2020    Obesity Behavioral Intervention:   Approximately 15 minutes were spent on the discussion below.  ASK: We discussed the diagnosis of obesity with Travis [name] today and Travis [name] agreed to give [place] permission to discuss obesity behavioral modification therapy today.  ASSESS: Travis [name] has the diagnosis of obesity and his BMI today is 36.8. Travis [name] is in the action stage of change.   ADVISE: Travis [name] was educated on the multiple health risks of obesity as well as the benefit of weight loss to improve his health. He was advised of the need for long term treatment and the importance of lifestyle modifications to improve his current health and to decrease his risk of future health problems.  AGREE: Multiple dietary modification options and treatment options were discussed and Travis [name] agreed to follow the recommendations documented in the above note.  ARRANGE: Travis [name] was educated on the importance of frequent visits to treat obesity as outlined per CMS and USPSTF guidelines and agreed to schedule his next follow up appointment today.  Attestation Statements:   Reviewed by clinician on day of visit: allergies, medications, problem list, medical history, surgical history, family history, social history, and previous encounter notes.  I, Davy Pique, RMA, am acting as transcriptionist for Coralie Common, MD. I have reviewed the above documentation for accuracy and completeness, and I agree with the above. - Coralie Common, MD

## 2021-10-26 ENCOUNTER — Other Ambulatory Visit: Payer: Self-pay | Admitting: Internal Medicine

## 2021-10-27 ENCOUNTER — Other Ambulatory Visit: Payer: Self-pay | Admitting: Otolaryngology

## 2021-10-29 DIAGNOSIS — J9620 Acute and chronic respiratory failure, unspecified whether with hypoxia or hypercapnia: Secondary | ICD-10-CM | POA: Diagnosis not present

## 2021-10-29 DIAGNOSIS — R0600 Dyspnea, unspecified: Secondary | ICD-10-CM | POA: Diagnosis not present

## 2021-10-31 ENCOUNTER — Other Ambulatory Visit (HOSPITAL_COMMUNITY): Payer: Self-pay | Admitting: Psychiatry

## 2021-10-31 ENCOUNTER — Other Ambulatory Visit: Payer: Self-pay | Admitting: Cardiovascular Disease

## 2021-10-31 ENCOUNTER — Telehealth (INDEPENDENT_AMBULATORY_CARE_PROVIDER_SITE_OTHER): Payer: Medicare Other | Admitting: Psychology

## 2021-10-31 DIAGNOSIS — F419 Anxiety disorder, unspecified: Secondary | ICD-10-CM

## 2021-10-31 DIAGNOSIS — F3289 Other specified depressive episodes: Secondary | ICD-10-CM

## 2021-10-31 DIAGNOSIS — F319 Bipolar disorder, unspecified: Secondary | ICD-10-CM

## 2021-11-01 ENCOUNTER — Encounter: Payer: Self-pay | Admitting: Podiatry

## 2021-11-01 ENCOUNTER — Ambulatory Visit (INDEPENDENT_AMBULATORY_CARE_PROVIDER_SITE_OTHER): Payer: Medicare Other | Admitting: Podiatry

## 2021-11-01 DIAGNOSIS — L84 Corns and callosities: Secondary | ICD-10-CM | POA: Diagnosis not present

## 2021-11-01 DIAGNOSIS — E1159 Type 2 diabetes mellitus with other circulatory complications: Secondary | ICD-10-CM | POA: Diagnosis not present

## 2021-11-01 DIAGNOSIS — I152 Hypertension secondary to endocrine disorders: Secondary | ICD-10-CM

## 2021-11-01 NOTE — Progress Notes (Unsigned)
  Office: [phone]  /  Fax: [phone]    Date: 11/15/2021   Appointment Start Time: *** Duration: *** minutes Provider: Glennie Isle, Psy.D. Type of Session: Individual Therapy  Location of Patient: {gbptloc:23249} (private location) Location of Provider: Provider's Home (private office) Type of Contact: Telepsychological Visit via MyChart Video Visit  Session Content: Travis [name] is a 71 y.o. male presenting for a follow-up appointment to address the previously established treatment goal of increasing coping skills.Today's appointment was a telepsychological visit. Travis [name] provided verbal consent for today's telepsychological appointment and he is aware he is responsible for securing confidentiality on his end of the session. Prior to proceeding with today's appointment, Travis [name]'s physical location at the time of this appointment was obtained as well a phone number he could be reached at in the event of technical difficulties. Travis [name] and this provider participated in today's telepsychological service.   This provider conducted a brief check-in. *** Travis [name] was receptive to today's appointment as evidenced by openness to sharing, responsiveness to feedback, and {gbreceptiveness:23401}.  Mental Status Examination:  Appearance: {Appearance:22431} Behavior: {Behavior:22445} Mood: {gbmood:21757} Affect: {Affect:22436} Speech: {Speech:22432} Eye Contact: {Eye Contact:22433} Psychomotor Activity: {Motor Activity:22434} Gait: {gbgait:23404} Thought Process: {thought process:22448}  Thought Content/Perception: {disturbances:22451} Orientation: {Orientation:22437} Memory/Concentration: {gbcognition:22449} Insight: {Insight:22446} Judgment: {Insight:22446}  Interventions:  {Interventions for Progress Notes:23405}  DSM-5 Diagnosis(es): F32.89 Other Specified Depressive Disorder, Emotional and Binge Eating Behaviors  Treatment Goal & Progress: During the initial appointment with this  provider, the following treatment goal was established: increase coping skills. Travis [name] has demonstrated progress in his goal as evidenced by {gbtxprogress:22839}. Travis [name] also {gbtxprogress2:22951}.  Plan: The next appointment is scheduled for *** at ***, which will be via MyChart Video Visit. The next session will focus on {Plan for Next Appointment:23400}.
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
## 2021-11-07 ENCOUNTER — Other Ambulatory Visit (INDEPENDENT_AMBULATORY_CARE_PROVIDER_SITE_OTHER): Payer: Self-pay | Admitting: Family Medicine

## 2021-11-07 ENCOUNTER — Other Ambulatory Visit: Payer: Self-pay | Admitting: Internal Medicine

## 2021-11-07 ENCOUNTER — Telehealth (INDEPENDENT_AMBULATORY_CARE_PROVIDER_SITE_OTHER): Payer: Self-pay | Admitting: Family Medicine

## 2021-11-07 DIAGNOSIS — E1165 Type 2 diabetes mellitus with hyperglycemia: Secondary | ICD-10-CM

## 2021-11-07 NOTE — Telephone Encounter (Signed)
Pt asking for a refill on his ozempic medication. The best pharmacy is [facility], [place]. The best call back is [phone].   Pt previous appt 6/12

## 2021-11-07 NOTE — Telephone Encounter (Signed)
LAST APPOINTMENT DATE: 10/31/21 NEXT APPOINTMENT DATE: 11/14/21   [place], [place] [address] [place] [address] Phone: [phone] Fax: [phone]  Patient is requesting a refill of the following medications: No prescriptions requested or ordered in this encounter   Date last filled: 10/10/21 Previously prescribed by [facility]  Lab Results      Component                Value               Date                      HGBA1C                   5.5                 06/14/2021                HGBA1C                   5.6                 04/11/2021                HGBA1C                   5.8 (H)             03/09/2021           Lab Results      Component                Value               Date                      MICROALBUR               <0.7                06/14/2021                [place]                  60                  06/14/2021                CREATININE               0.89                06/14/2021           Lab Results      Component                Value               Date                      VD25OH                   40.99               06/14/2021                VD25OH  51.0                04/11/2021                VD25OH                   53.07               12/01/2020            BP Readings from Last 3 Encounters: 10/18/21 : 118/70 10/04/21 : 118/60 08/17/21 : (!) 149/81

## 2021-11-10 ENCOUNTER — Ambulatory Visit: Payer: Medicare Other | Admitting: Psychologist

## 2021-11-14 ENCOUNTER — Other Ambulatory Visit: Payer: Self-pay | Admitting: Internal Medicine

## 2021-11-14 ENCOUNTER — Encounter (INDEPENDENT_AMBULATORY_CARE_PROVIDER_SITE_OTHER): Payer: Self-pay | Admitting: Family Medicine

## 2021-11-14 ENCOUNTER — Ambulatory Visit (INDEPENDENT_AMBULATORY_CARE_PROVIDER_SITE_OTHER): Payer: Medicare Other | Admitting: Family Medicine

## 2021-11-14 VITALS — BP 103/73 | HR 64 | Temp 97.7°F | Ht 70.0 in | Wt 259.0 lb

## 2021-11-14 DIAGNOSIS — E1159 Type 2 diabetes mellitus with other circulatory complications: Secondary | ICD-10-CM | POA: Diagnosis not present

## 2021-11-14 DIAGNOSIS — Z6837 Body mass index (BMI) 37.0-37.9, adult: Secondary | ICD-10-CM

## 2021-11-14 DIAGNOSIS — I152 Hypertension secondary to endocrine disorders: Secondary | ICD-10-CM

## 2021-11-14 DIAGNOSIS — E1165 Type 2 diabetes mellitus with hyperglycemia: Secondary | ICD-10-CM

## 2021-11-14 DIAGNOSIS — Z7985 Long-term (current) use of injectable non-insulin antidiabetic drugs: Secondary | ICD-10-CM

## 2021-11-14 DIAGNOSIS — E669 Obesity, unspecified: Secondary | ICD-10-CM | POA: Diagnosis not present

## 2021-11-14 MED ORDER — OZEMPIC (0.25 OR 0.5 MG/DOSE) 2 MG/1.5ML ~~LOC~~ SOPN: 0.5000 mg | PEN_INJECTOR | SUBCUTANEOUS | 0 refills | Status: DC

## 2021-11-15 ENCOUNTER — Other Ambulatory Visit: Payer: Self-pay | Admitting: Internal Medicine

## 2021-11-15 ENCOUNTER — Telehealth (INDEPENDENT_AMBULATORY_CARE_PROVIDER_SITE_OTHER): Payer: Medicare Other | Admitting: Psychology

## 2021-11-15 DIAGNOSIS — F3289 Other specified depressive episodes: Secondary | ICD-10-CM

## 2021-11-15 NOTE — Progress Notes (Signed)
Chief Complaint:   OBESITY Diaz is here to discuss his progress with his obesity treatment plan along with follow-up of his obesity related diagnoses. Hyman is on the Category 4 Plan and states he is following his eating plan approximately 50% of the time. Zollie states he is going to the gym 45 minutes 3 times per week.  Today's visit was #: 10 Starting weight: 302 lbs Starting date: 04/06/2021 Today's weight: 259 lbs Today's date: 11/14/2021 Total lbs lost to date: 43 lbs Total lbs lost since last in-office visit: 0  Interim History: Ladarrion is starting to feel bored with meal plan. He is really craving ice cream. He feels he tried with how he prepares food and food type. He is limited on prep time due to back pain and leg pain. He is interested in options for journaling at lunch.  Subjective:   1. Type 2 diabetes mellitus with hyperglycemia, without long-term current use of insulin (HCC) Deen is currently on Ozempic 0.5 mg SubQ weekly. Denies GI side effects.His last A1c was 5.5.  2. Hypertension associated with diabetes ([place]) Huntley's blood pressure is well controlled today. Denies chest pain, chest pressure and headache. He is currently taking Cozaar HCTZ.  Assessment/Plan:   1. Type 2 diabetes mellitus with hyperglycemia, without long-term current use of insulin (HCC) We will refill Ozempic 0.5 mg SubQ once weekly for 1 month with 0 refills.  -Refill Semaglutide,0.25 or 0.5MG/DOS, (OZEMPIC, 0.25 OR 0.5 MG/DOSE,) 2 MG/1.5ML SOPN; Inject 0.5 mg into the skin once a week.  Dispense: 3 mL; Refill: 0  2. Hypertension associated with diabetes [facility]) Kristof will continue with current medications without change in dose.  3. Obesity with current BMI of 37.1 Manasseh is currently in the action stage of change. As such, his goal is to continue with weight loss efforts. He has agreed to keeping a food journal and adhering to recommended goals of 450-600 calories and 40+  grams of protein at lunch.  Exercise goals: All adults should avoid inactivity. Some physical activity is better than none, and adults who participate in any amount of physical activity gain some health benefits.  Behavioral modification strategies: increasing lean protein intake, meal planning and cooking strategies, keeping healthy foods in the home, planning for success, and keeping a strict food journal.  Janzen has agreed to follow-up with our clinic in 4 weeks. He was informed of the importance of frequent follow-up visits to maximize his success with intensive lifestyle modifications for his multiple health conditions.   Objective:   Blood pressure 103/73, pulse 64, temperature 97.7 F (36.5 C), height 5' 10"  (1.778 m), weight 259 lb (117.5 kg), SpO2 96 %. Body mass index is 37.16 kg/m.  General: Cooperative, alert, well developed, in no acute distress. HEENT: Conjunctivae and lids unremarkable. Cardiovascular: Regular rhythm.  Lungs: Normal work of breathing. Neurologic: No focal deficits.   Lab Results  Component Value Date   CREATININE 0.89 06/14/2021   BUN 14 06/14/2021   NA 139 06/14/2021   K 4.0 06/14/2021   CL 103 06/14/2021   CO2 28 06/14/2021   Lab Results  Component Value Date   ALT 11 06/14/2021   AST 17 06/14/2021   ALKPHOS 58 06/14/2021   BILITOT 0.3 06/14/2021   Lab Results  Component Value Date   HGBA1C 5.5 06/14/2021   HGBA1C 5.6 04/11/2021   HGBA1C 5.8 (H) 03/09/2021   HGBA1C 6.1 12/01/2020   HGBA1C 5.7 (A) 02/24/2020   Lab Results  Component Value Date   INSULIN 12.2 04/11/2021   Lab Results  Component Value Date   TSH 2.63 06/14/2021   Lab Results  Component Value Date   CHOL 129 06/14/2021   HDL 48.10 06/14/2021   LDLCALC 60 06/14/2021   TRIG 104.0 06/14/2021   CHOLHDL 3 06/14/2021   Lab Results  Component Value Date   VD25OH 40.99 06/14/2021   VD25OH 51.0 04/11/2021   VD25OH 53.07 12/01/2020   Lab Results  Component  Value Date   WBC 3.7 (L) 06/14/2021   HGB 12.3 (L) 06/14/2021   HCT 37.6 (L) 06/14/2021   MCV 88.1 06/14/2021   PLT 219.0 06/14/2021   Lab Results  Component Value Date   IRON 49 12/01/2020   TIBC 186 (L) 10/18/2014   FERRITIN 25.6 12/01/2020    Obesity Behavioral Intervention:   Approximately 15 minutes were spent on the discussion below.  ASK: We discussed the diagnosis of obesity with [name] today and Yonas agreed to give [place] permission to discuss obesity behavioral modification therapy today.  ASSESS: Strother has the diagnosis of obesity and his BMI today is 37.16. Dmarius is in the action stage of change.   ADVISE: Ura was educated on the multiple health risks of obesity as well as the benefit of weight loss to improve his health. He was advised of the need for long term treatment and the importance of lifestyle modifications to improve his current health and to decrease his risk of future health problems.  AGREE: Multiple dietary modification options and treatment options were discussed and [place] agreed to follow the recommendations documented in the above note.  ARRANGE: Thelmer was educated on the importance of frequent visits to treat obesity as outlined per CMS and USPSTF guidelines and agreed to schedule his next follow up appointment today.  Attestation Statements:   Reviewed by clinician on day of visit: allergies, medications, problem list, medical history, surgical history, family history, social history, and previous encounter notes.  I, Elnora Morrison, RMA am acting as transcriptionist for Coralie Common, MD.  I have reviewed the above documentation for accuracy and completeness, and I agree with the above. - Coralie Common, MD

## 2021-11-16 ENCOUNTER — Encounter (HOSPITAL_COMMUNITY)
Admission: RE | Admit: 2021-11-16 | Discharge: 2021-11-16 | Disposition: A | Discharge status: Home / Self Care | Payer: Medicare Other | Source: Ambulatory Visit | Attending: Otolaryngology | Admitting: Otolaryngology

## 2021-11-16 ENCOUNTER — Other Ambulatory Visit: Payer: Self-pay

## 2021-11-16 ENCOUNTER — Encounter (HOSPITAL_COMMUNITY): Payer: Self-pay

## 2021-11-16 DIAGNOSIS — E669 Obesity, unspecified: Secondary | ICD-10-CM | POA: Diagnosis not present

## 2021-11-16 DIAGNOSIS — I10 Essential (primary) hypertension: Secondary | ICD-10-CM | POA: Diagnosis not present

## 2021-11-16 DIAGNOSIS — F419 Anxiety disorder, unspecified: Secondary | ICD-10-CM | POA: Insufficient documentation

## 2021-11-16 DIAGNOSIS — E1159 Type 2 diabetes mellitus with other circulatory complications: Secondary | ICD-10-CM | POA: Insufficient documentation

## 2021-11-16 DIAGNOSIS — R0600 Dyspnea, unspecified: Secondary | ICD-10-CM | POA: Diagnosis not present

## 2021-11-16 DIAGNOSIS — Z01812 Encounter for preprocedural laboratory examination: Secondary | ICD-10-CM | POA: Diagnosis not present

## 2021-11-16 DIAGNOSIS — K219 Gastro-esophageal reflux disease without esophagitis: Secondary | ICD-10-CM | POA: Insufficient documentation

## 2021-11-16 DIAGNOSIS — J343 Hypertrophy of nasal turbinates: Secondary | ICD-10-CM | POA: Insufficient documentation

## 2021-11-16 DIAGNOSIS — M797 Fibromyalgia: Secondary | ICD-10-CM | POA: Diagnosis not present

## 2021-11-16 DIAGNOSIS — E119 Type 2 diabetes mellitus without complications: Secondary | ICD-10-CM | POA: Insufficient documentation

## 2021-11-16 DIAGNOSIS — R011 Cardiac murmur, unspecified: Secondary | ICD-10-CM | POA: Insufficient documentation

## 2021-11-16 DIAGNOSIS — G4733 Obstructive sleep apnea (adult) (pediatric): Secondary | ICD-10-CM | POA: Diagnosis not present

## 2021-11-16 DIAGNOSIS — J324 Chronic pansinusitis: Secondary | ICD-10-CM | POA: Insufficient documentation

## 2021-11-16 DIAGNOSIS — Z87891 Personal history of nicotine dependence: Secondary | ICD-10-CM | POA: Insufficient documentation

## 2021-11-16 DIAGNOSIS — R001 Bradycardia, unspecified: Secondary | ICD-10-CM | POA: Diagnosis not present

## 2021-11-16 DIAGNOSIS — E785 Hyperlipidemia, unspecified: Secondary | ICD-10-CM | POA: Diagnosis not present

## 2021-11-16 DIAGNOSIS — I2510 Atherosclerotic heart disease of native coronary artery without angina pectoris: Secondary | ICD-10-CM | POA: Insufficient documentation

## 2021-11-16 DIAGNOSIS — I152 Hypertension secondary to endocrine disorders: Secondary | ICD-10-CM | POA: Diagnosis not present

## 2021-11-16 LAB — CBC
HCT: 38.2 % — ABNORMAL LOW (ref 39.0–52.0)
Hemoglobin: 12.5 g/dL — ABNORMAL LOW (ref 13.0–17.0)
MCH: 30.4 pg (ref 26.0–34.0)
MCHC: 32.7 g/dL (ref 30.0–36.0)
MCV: 92.9 fL (ref 80.0–100.0)
Platelets: 190 10*3/uL (ref 150–400)
RBC: 4.11 MIL/uL — ABNORMAL LOW (ref 4.22–5.81)
RDW: 13.1 % (ref 11.5–15.5)
WBC: 5.3 10*3/uL (ref 4.0–10.5)
nRBC: 0 % (ref 0.0–0.2)

## 2021-11-16 LAB — COMPREHENSIVE METABOLIC PANEL
ALT: 11 U/L (ref 0–44)
AST: 20 U/L (ref 15–41)
Albumin: 3.7 g/dL (ref 3.5–5.0)
Alkaline Phosphatase: 65 U/L (ref 38–126)
Anion gap: 9 (ref 5–15)
BUN: 18 mg/dL (ref 8–23)
CO2: 24 mmol/L (ref 22–32)
Calcium: 9 mg/dL (ref 8.9–10.3)
Chloride: 108 mmol/L (ref 98–111)
Creatinine, Ser: 1.03 mg/dL (ref 0.61–1.24)
GFR, Estimated: >60 mL/min (ref >60)
Glucose, Bld: 101 mg/dL — ABNORMAL HIGH (ref 70–99)
Potassium: 4 mmol/L (ref 3.5–5.1)
Sodium: 141 mmol/L (ref 135–145)
Total Bilirubin: 0.8 mg/dL (ref 0.3–1.2)
Total Protein: 6.1 g/dL — ABNORMAL LOW (ref 6.5–8.1)

## 2021-11-16 NOTE — Progress Notes (Signed)
PCP - [name] Cardiologist - [place]  PPM/ICD - Denies Device Orders -  Rep Notified -   Chest x-ray - 01/01/21 EKG - 04/13/21 Stress Test - 03/26/18 ECHO - 03/18/21 Cardiac Cath - Done in [facility] has 9 stents  Sleep Study - Yes has OSA CPAP - Nightly  DM - Not any more per patient A1C 5.6 on 09/27/21  Blood Thinner Instructions: Effient and aspirin stopped 11/16/21 last dose 11/15/21 Aspirin Instructions: above  ERAS Protcol -Yes    Anesthesia review:  Yes cardiac history  Patient denies shortness of breath, fever, cough and chest pain at PAT appointment   All instructions explained to the patient, with a verbal understanding of the material. Patient agrees to go over the instructions while at home for a better understanding. The opportunity to ask questions was provided.

## 2021-11-17 ENCOUNTER — Ambulatory Visit (INDEPENDENT_AMBULATORY_CARE_PROVIDER_SITE_OTHER): Payer: Medicare Other | Admitting: Internal Medicine

## 2021-11-17 VITALS — BP 136/70 | HR 51 | Temp 98.3°F | Ht 70.0 in

## 2021-11-17 DIAGNOSIS — I2510 Atherosclerotic heart disease of native coronary artery without angina pectoris: Secondary | ICD-10-CM

## 2021-11-17 DIAGNOSIS — E669 Obesity, unspecified: Secondary | ICD-10-CM

## 2021-11-17 DIAGNOSIS — G894 Chronic pain syndrome: Secondary | ICD-10-CM | POA: Diagnosis not present

## 2021-11-17 DIAGNOSIS — I739 Peripheral vascular disease, unspecified: Secondary | ICD-10-CM

## 2021-11-17 DIAGNOSIS — R269 Unspecified abnormalities of gait and mobility: Secondary | ICD-10-CM | POA: Diagnosis not present

## 2021-11-17 DIAGNOSIS — E1169 Type 2 diabetes mellitus with other specified complication: Secondary | ICD-10-CM

## 2021-11-17 MED ORDER — DONEPEZIL HCL 10 MG PO TABS: 10.0000 mg | ORAL_TABLET | Freq: Every day | ORAL | 3 refills | Status: DC

## 2021-11-17 NOTE — Anesthesia Preprocedure Evaluation (Addendum)
Anesthesia Evaluation  Patient identified by MRN, date of birth, ID band Patient awake    Reviewed: Allergy & Precautions, H&P , NPO status , Patient's Chart, lab work & pertinent test results  Airway Mallampati: II   Neck ROM: full    Dental   Pulmonary shortness of breath, sleep apnea , former smoker,    breath sounds clear to auscultation       Cardiovascular hypertension, + CAD, + Past MI, + Cardiac Stents and + Peripheral Vascular Disease  + dysrhythmias  Rhythm:regular Rate:Normal  H/o bradycardia. EF 60%.   Neuro/Psych  Headaches, PSYCHIATRIC DISORDERS Anxiety Depression  Neuromuscular disease    GI/Hepatic GERD  ,  Endo/Other  diabetes, Type 2  Renal/GU      Musculoskeletal  (+) Arthritis , Fibromyalgia -  Abdominal   Peds  Hematology   Anesthesia Other Findings   Reproductive/Obstetrics                            Anesthesia Physical Anesthesia Plan  ASA: 3  Anesthesia Plan: General   Post-op Pain Management:    Induction: Intravenous  PONV Risk Score and Plan: 2 and Ondansetron, Dexamethasone, Treatment may vary due to age or medical condition and Midazolam  Airway Management Planned: Oral ETT  Additional Equipment:   Intra-op Plan:   Post-operative Plan: Extubation in OR  Informed Consent: I have reviewed the patients History and Physical, chart, labs and discussed the procedure including the risks, benefits and alternatives for the proposed anesthesia with the patient or authorized representative who has indicated his/her understanding and acceptance.     Dental advisory given  Plan Discussed with: Anesthesiologist, CRNA and Surgeon  Anesthesia Plan Comments: (PAT note written 11/17/2021 by Myra Gianotti, PA-C. )       Anesthesia Quick Evaluation

## 2021-11-17 NOTE — Patient Instructions (Signed)
Please consider having the Shingles shot done at the [facility] as you mentioned  You are given the Rx for the Power Scooter, so please present this to [facility], and I think they can start the process of further evaluation for this, including a Physical Therapist Mobility Exam  You may also need another visit her after the PT Mobility Exam to finish the final paperwork  Please continue all other medications as before, and refills have been done if requested.  Please have the pharmacy call with any other refills you may need.  Please keep your appointments with your specialists as you may have planned  Good luck with your Sinus surgury and the SI joint Fusion for July 2023 !
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
## 2021-11-20 NOTE — Assessment & Plan Note (Signed)
Agree would qualify for power scooter, gave rx for pt to present to [facility] to start process

## 2021-11-20 NOTE — Assessment & Plan Note (Signed)
S/p partial LLE amputation, now stable, cont current med tx

## 2021-11-20 NOTE — Assessment & Plan Note (Signed)
Lab Results  Component Value Date   HGBA1C 5.5 06/14/2021   Stable, pt to continue current medical treatment  - ozempic for sugar and wt control

## 2021-11-20 NOTE — Assessment & Plan Note (Signed)
Primarily LBP at this time, for SI joint fusion July 2023, will need PT after, gave rx for power scooter as well but unlikely to get this approved prior to surgury
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
## 2021-11-20 NOTE — Assessment & Plan Note (Signed)
Stable overall, cont currrent med tx, f/u cardiology as planned

## 2021-11-23 ENCOUNTER — Other Ambulatory Visit: Payer: Self-pay

## 2021-11-23 ENCOUNTER — Ambulatory Visit (HOSPITAL_COMMUNITY): Payer: Medicare Other | Admitting: Vascular Surgery

## 2021-11-23 ENCOUNTER — Ambulatory Visit (HOSPITAL_BASED_OUTPATIENT_CLINIC_OR_DEPARTMENT_OTHER): Payer: Medicare Other | Admitting: Certified Registered"

## 2021-11-23 ENCOUNTER — Encounter (HOSPITAL_COMMUNITY)
Admission: RE | Disposition: A | Discharge status: Home / Self Care | Payer: Self-pay | Source: Home / Self Care | Attending: Otolaryngology

## 2021-11-23 ENCOUNTER — Encounter (HOSPITAL_COMMUNITY): Payer: Self-pay | Admitting: Otolaryngology

## 2021-11-23 ENCOUNTER — Observation Stay (HOSPITAL_COMMUNITY)
Admission: RE | Admit: 2021-11-23 | Discharge: 2021-11-24 | Disposition: A | Discharge status: Home / Self Care | Payer: Medicare Other | Source: Home / Self Care | Attending: Otolaryngology | Admitting: Otolaryngology

## 2021-11-23 ENCOUNTER — Other Ambulatory Visit: Payer: Self-pay | Admitting: Internal Medicine

## 2021-11-23 DIAGNOSIS — J324 Chronic pansinusitis: Secondary | ICD-10-CM | POA: Insufficient documentation

## 2021-11-23 DIAGNOSIS — Z96653 Presence of artificial knee joint, bilateral: Secondary | ICD-10-CM | POA: Insufficient documentation

## 2021-11-23 DIAGNOSIS — I2510 Atherosclerotic heart disease of native coronary artery without angina pectoris: Secondary | ICD-10-CM

## 2021-11-23 DIAGNOSIS — J329 Chronic sinusitis, unspecified: Secondary | ICD-10-CM | POA: Diagnosis present

## 2021-11-23 DIAGNOSIS — Z981 Arthrodesis status: Secondary | ICD-10-CM | POA: Diagnosis not present

## 2021-11-23 DIAGNOSIS — Z7984 Long term (current) use of oral hypoglycemic drugs: Secondary | ICD-10-CM | POA: Insufficient documentation

## 2021-11-23 DIAGNOSIS — Z87891 Personal history of nicotine dependence: Secondary | ICD-10-CM | POA: Insufficient documentation

## 2021-11-23 DIAGNOSIS — E785 Hyperlipidemia, unspecified: Secondary | ICD-10-CM | POA: Diagnosis not present

## 2021-11-23 DIAGNOSIS — E1165 Type 2 diabetes mellitus with hyperglycemia: Secondary | ICD-10-CM | POA: Diagnosis not present

## 2021-11-23 DIAGNOSIS — I252 Old myocardial infarction: Secondary | ICD-10-CM

## 2021-11-23 DIAGNOSIS — I10 Essential (primary) hypertension: Secondary | ICD-10-CM | POA: Insufficient documentation

## 2021-11-23 DIAGNOSIS — R509 Fever, unspecified: Secondary | ICD-10-CM | POA: Diagnosis not present

## 2021-11-23 DIAGNOSIS — G4730 Sleep apnea, unspecified: Secondary | ICD-10-CM | POA: Diagnosis not present

## 2021-11-23 DIAGNOSIS — Z7982 Long term (current) use of aspirin: Secondary | ICD-10-CM | POA: Insufficient documentation

## 2021-11-23 DIAGNOSIS — E119 Type 2 diabetes mellitus without complications: Secondary | ICD-10-CM | POA: Insufficient documentation

## 2021-11-23 DIAGNOSIS — Z888 Allergy status to other drugs, medicaments and biological substances status: Secondary | ICD-10-CM | POA: Diagnosis not present

## 2021-11-23 DIAGNOSIS — R079 Chest pain, unspecified: Secondary | ICD-10-CM | POA: Diagnosis not present

## 2021-11-23 DIAGNOSIS — Z955 Presence of coronary angioplasty implant and graft: Secondary | ICD-10-CM | POA: Insufficient documentation

## 2021-11-23 DIAGNOSIS — Z85038 Personal history of other malignant neoplasm of large intestine: Secondary | ICD-10-CM | POA: Insufficient documentation

## 2021-11-23 DIAGNOSIS — Z96643 Presence of artificial hip joint, bilateral: Secondary | ICD-10-CM | POA: Insufficient documentation

## 2021-11-23 DIAGNOSIS — J343 Hypertrophy of nasal turbinates: Secondary | ICD-10-CM | POA: Insufficient documentation

## 2021-11-23 DIAGNOSIS — Z96612 Presence of left artificial shoulder joint: Secondary | ICD-10-CM | POA: Insufficient documentation

## 2021-11-23 DIAGNOSIS — G4733 Obstructive sleep apnea (adult) (pediatric): Secondary | ICD-10-CM | POA: Diagnosis not present

## 2021-11-23 DIAGNOSIS — G894 Chronic pain syndrome: Secondary | ICD-10-CM | POA: Diagnosis not present

## 2021-11-23 DIAGNOSIS — M159 Polyosteoarthritis, unspecified: Secondary | ICD-10-CM | POA: Diagnosis not present

## 2021-11-23 DIAGNOSIS — E1142 Type 2 diabetes mellitus with diabetic polyneuropathy: Secondary | ICD-10-CM | POA: Diagnosis not present

## 2021-11-23 DIAGNOSIS — A4151 Sepsis due to Escherichia coli [E. coli]: Secondary | ICD-10-CM | POA: Diagnosis not present

## 2021-11-23 DIAGNOSIS — K8689 Other specified diseases of pancreas: Secondary | ICD-10-CM | POA: Diagnosis not present

## 2021-11-23 DIAGNOSIS — Z79899 Other long term (current) drug therapy: Secondary | ICD-10-CM | POA: Insufficient documentation

## 2021-11-23 DIAGNOSIS — A419 Sepsis, unspecified organism: Secondary | ICD-10-CM | POA: Diagnosis not present

## 2021-11-23 DIAGNOSIS — R059 Cough, unspecified: Secondary | ICD-10-CM | POA: Diagnosis not present

## 2021-11-23 DIAGNOSIS — M545 Low back pain, unspecified: Secondary | ICD-10-CM | POA: Diagnosis not present

## 2021-11-23 DIAGNOSIS — Z8249 Family history of ischemic heart disease and other diseases of the circulatory system: Secondary | ICD-10-CM | POA: Diagnosis not present

## 2021-11-23 DIAGNOSIS — K5100 Ulcerative (chronic) pancolitis without complications: Secondary | ICD-10-CM | POA: Diagnosis not present

## 2021-11-23 DIAGNOSIS — Z89511 Acquired absence of right leg below knee: Secondary | ICD-10-CM | POA: Diagnosis not present

## 2021-11-23 HISTORY — PX: SINUS ENDO WITH FUSION: SHX5329

## 2021-11-23 LAB — GLUCOSE, CAPILLARY
Glucose-Capillary: 114 mg/dL — ABNORMAL HIGH (ref 70–99)
Glucose-Capillary: 154 mg/dL — ABNORMAL HIGH (ref 70–99)
Glucose-Capillary: 230 mg/dL — ABNORMAL HIGH (ref 70–99)
Glucose-Capillary: 228 mg/dL — ABNORMAL HIGH (ref 70–99)

## 2021-11-23 SURGERY — SURGERY, PARANASAL SINUS, ENDOSCOPIC, WITH NASAL SEPTOPLASTY, TURBINOPLASTY, AND MAXILLARY SINUSOTOMY
Anesthesia: General | Site: Nose | Laterality: Bilateral

## 2021-11-23 MED ORDER — ONDANSETRON HCL 4 MG/2ML IJ SOLN: 4.0000 mg | Freq: Four times a day (QID) | INTRAMUSCULAR | Status: DC | PRN

## 2021-11-23 MED ORDER — AMOXICILLIN-POT CLAVULANATE 875-125 MG PO TABS
1.0000 | ORAL_TABLET | Freq: Two times a day (BID) | ORAL | Status: DC
  Administered 2021-11-23 (×2): 1 via ORAL
  Filled 2021-11-23 (×2): qty 1

## 2021-11-23 MED ORDER — CEFAZOLIN SODIUM-DEXTROSE 2-4 GM/100ML-% IV SOLN
2.0000 g | INTRAVENOUS | Status: AC
  Administered 2021-11-23: 2 g via INTRAVENOUS
  Filled 2021-11-23: qty 100

## 2021-11-23 MED ORDER — DICLOFENAC SODIUM 1 % EX GEL: 4.0000 g | Freq: Three times a day (TID) | CUTANEOUS | Status: DC | PRN

## 2021-11-23 MED ORDER — TAMSULOSIN HCL 0.4 MG PO CAPS
0.4000 mg | ORAL_CAPSULE | Freq: Two times a day (BID) | ORAL | Status: DC
  Administered 2021-11-23: 0.4 mg via ORAL
  Filled 2021-11-23: qty 1

## 2021-11-23 MED ORDER — AZELASTINE HCL 0.1 % NA SOLN
2.0000 | Freq: Two times a day (BID) | NASAL | Status: DC
  Filled 2021-11-23: qty 30

## 2021-11-23 MED ORDER — MUPIROCIN 2 % EX OINT
TOPICAL_OINTMENT | CUTANEOUS | Status: AC
Start: 2021-11-23 — End: ?
  Filled 2021-11-23: qty 22

## 2021-11-23 MED ORDER — FENTANYL CITRATE (PF) 250 MCG/5ML IJ SOLN
INTRAMUSCULAR | Status: AC
  Filled 2021-11-23: qty 5

## 2021-11-23 MED ORDER — SUGAMMADEX SODIUM 200 MG/2ML IV SOLN
INTRAVENOUS | Status: DC | PRN
  Administered 2021-11-23: 200 mg via INTRAVENOUS

## 2021-11-23 MED ORDER — FENTANYL CITRATE (PF) 100 MCG/2ML IJ SOLN: 25.0000 ug | INTRAMUSCULAR | Status: DC | PRN

## 2021-11-23 MED ORDER — ONDANSETRON HCL 4 MG/2ML IJ SOLN
INTRAMUSCULAR | Status: DC | PRN
  Administered 2021-11-23: 4 mg via INTRAVENOUS

## 2021-11-23 MED ORDER — SALINE SPRAY 0.65 % NA SOLN
2.0000 | NASAL | Status: DC
  Administered 2021-11-23 – 2021-11-24 (×3): 2 via NASAL
  Filled 2021-11-23: qty 44

## 2021-11-23 MED ORDER — PROPOFOL 10 MG/ML IV BOLUS
INTRAVENOUS | Status: DC | PRN
  Administered 2021-11-23: 200 mg via INTRAVENOUS

## 2021-11-23 MED ORDER — MIRABEGRON ER 25 MG PO TB24
50.0000 mg | ORAL_TABLET | Freq: Every day | ORAL | Status: DC
  Administered 2021-11-23: 50 mg via ORAL
  Filled 2021-11-23: qty 2

## 2021-11-23 MED ORDER — PANCRELIPASE (LIP-PROT-AMYL) 36000-114000 UNITS PO CPEP
72000.0000 [IU] | ORAL_CAPSULE | Freq: Three times a day (TID) | ORAL | Status: DC
  Administered 2021-11-23: 72000 [IU] via ORAL
  Filled 2021-11-23: qty 2

## 2021-11-23 MED ORDER — HYDROCHLOROTHIAZIDE 25 MG PO TABS: 25.0000 mg | ORAL_TABLET | Freq: Every day | ORAL | Status: DC

## 2021-11-23 MED ORDER — LACTATED RINGERS IV SOLN: INTRAVENOUS | Status: DC

## 2021-11-23 MED ORDER — FLUTICASONE PROPIONATE 50 MCG/ACT NA SUSP
1.0000 | Freq: Two times a day (BID) | NASAL | Status: DC
  Filled 2021-11-23: qty 16

## 2021-11-23 MED ORDER — ONDANSETRON HCL 4 MG/2ML IJ SOLN: 4.0000 mg | INTRAMUSCULAR | Status: DC | PRN

## 2021-11-23 MED ORDER — VITAMIN D3 25 MCG (1000 UNIT) PO TABS
1000.0000 [IU] | ORAL_TABLET | Freq: Every day | ORAL | Status: DC
  Administered 2021-11-23: 1000 [IU] via ORAL
  Filled 2021-11-23 (×2): qty 1

## 2021-11-23 MED ORDER — TESTOSTERONE 50 MG/5GM (1%) TD GEL: 2.5000 g | Freq: Every day | TRANSDERMAL | Status: DC

## 2021-11-23 MED ORDER — BUPRENORPHINE HCL 600 MCG BU FILM: 600.0000 ug | ORAL_FILM | Freq: Two times a day (BID) | BUCCAL | Status: DC

## 2021-11-23 MED ORDER — BUPRENORPHINE HCL 2 MG SL SUBL
6.0000 mg | SUBLINGUAL_TABLET | Freq: Two times a day (BID) | SUBLINGUAL | Status: DC
  Administered 2021-11-23: 6 mg via SUBLINGUAL
  Filled 2021-11-23: qty 3

## 2021-11-23 MED ORDER — METHOCARBAMOL 500 MG PO TABS
500.0000 mg | ORAL_TABLET | Freq: Three times a day (TID) | ORAL | Status: DC | PRN
Start: 2021-11-23 — End: 2021-11-24
  Administered 2021-11-24: 500 mg via ORAL
  Filled 2021-11-23: qty 1

## 2021-11-23 MED ORDER — BACITRACIN ZINC 500 UNIT/GM EX OINT
TOPICAL_OINTMENT | CUTANEOUS | Status: AC
  Filled 2021-11-23: qty 28.35

## 2021-11-23 MED ORDER — AZELASTINE-FLUTICASONE 137-50 MCG/ACT NA SUSP
2.0000 | Freq: Two times a day (BID) | NASAL | Status: DC
Start: 2021-11-23 — End: 2021-11-23

## 2021-11-23 MED ORDER — GABAPENTIN 400 MG PO CAPS
800.0000 mg | ORAL_CAPSULE | Freq: Three times a day (TID) | ORAL | Status: DC
  Administered 2021-11-23 (×2): 800 mg via ORAL
  Filled 2021-11-23 (×2): qty 2

## 2021-11-23 MED ORDER — OXYMETAZOLINE HCL 0.05 % NA SOLN
NASAL | Status: DC | PRN
  Administered 2021-11-23: 1

## 2021-11-23 MED ORDER — LIDOCAINE 2% (20 MG/ML) 5 ML SYRINGE
INTRAMUSCULAR | Status: DC | PRN
  Administered 2021-11-23: 40 mg via INTRAVENOUS

## 2021-11-23 MED ORDER — OXYMETAZOLINE HCL 0.05 % NA SOLN
NASAL | Status: AC
Start: 2021-11-23 — End: ?
  Filled 2021-11-23: qty 30

## 2021-11-23 MED ORDER — PHENYLEPHRINE 80 MCG/ML (10ML) SYRINGE FOR IV PUSH (FOR BLOOD PRESSURE SUPPORT)
PREFILLED_SYRINGE | INTRAVENOUS | Status: AC
  Filled 2021-11-23: qty 10

## 2021-11-23 MED ORDER — ALBUTEROL SULFATE HFA 108 (90 BASE) MCG/ACT IN AERS
2.0000 | INHALATION_SPRAY | Freq: Four times a day (QID) | RESPIRATORY_TRACT | Status: DC | PRN
Start: 2021-11-23 — End: 2021-11-23

## 2021-11-23 MED ORDER — DEXAMETHASONE SODIUM PHOSPHATE 10 MG/ML IJ SOLN
INTRAMUSCULAR | Status: DC | PRN
  Administered 2021-11-23: 10 mg via INTRAVENOUS

## 2021-11-23 MED ORDER — PANCRELIPASE (LIP-PROT-AMYL) 36000-114000 UNITS PO CPEP
36000.0000 [IU] | ORAL_CAPSULE | Freq: Three times a day (TID) | ORAL | Status: DC | PRN
Start: 2021-11-23 — End: 2021-11-24

## 2021-11-23 MED ORDER — SODIUM CHLORIDE 0.9 % IR SOLN
Status: DC | PRN
  Administered 2021-11-23: 1000 mL

## 2021-11-23 MED ORDER — MOMETASONE FURO-FORMOTEROL FUM 200-5 MCG/ACT IN AERO
2.0000 | INHALATION_SPRAY | Freq: Two times a day (BID) | RESPIRATORY_TRACT | Status: DC
  Administered 2021-11-23 – 2021-11-24 (×2): 2 via RESPIRATORY_TRACT
  Filled 2021-11-23: qty 8.8

## 2021-11-23 MED ORDER — CALCIUM CARBONATE ANTACID 500 MG PO CHEW
1.0000 | CHEWABLE_TABLET | Freq: Three times a day (TID) | ORAL | Status: DC
  Administered 2021-11-23: 200 mg via ORAL
  Filled 2021-11-23: qty 1

## 2021-11-23 MED ORDER — PHENYLEPHRINE 80 MCG/ML (10ML) SYRINGE FOR IV PUSH (FOR BLOOD PRESSURE SUPPORT)
PREFILLED_SYRINGE | INTRAVENOUS | Status: DC | PRN
  Administered 2021-11-23 (×2): 80 ug via INTRAVENOUS

## 2021-11-23 MED ORDER — PROPOFOL 10 MG/ML IV BOLUS
INTRAVENOUS | Status: AC
  Filled 2021-11-23: qty 20

## 2021-11-23 MED ORDER — TRAZODONE HCL 100 MG PO TABS
100.0000 mg | ORAL_TABLET | Freq: Every day | ORAL | Status: DC
  Administered 2021-11-23: 100 mg via ORAL
  Filled 2021-11-23: qty 1

## 2021-11-23 MED ORDER — FENTANYL CITRATE (PF) 250 MCG/5ML IJ SOLN
INTRAMUSCULAR | Status: DC | PRN
  Administered 2021-11-23: 50 ug via INTRAVENOUS
  Administered 2021-11-23: 25 ug via INTRAVENOUS
  Administered 2021-11-23: 75 ug via INTRAVENOUS
  Administered 2021-11-23: 50 ug via INTRAVENOUS

## 2021-11-23 MED ORDER — ALBUTEROL SULFATE (2.5 MG/3ML) 0.083% IN NEBU: 2.5000 mg | INHALATION_SOLUTION | Freq: Four times a day (QID) | RESPIRATORY_TRACT | Status: DC | PRN

## 2021-11-23 MED ORDER — ORAL CARE MOUTH RINSE: 15.0000 mL | Freq: Once | OROMUCOSAL | Status: AC

## 2021-11-23 MED ORDER — ASPIRIN 81 MG PO TBEC
81.0000 mg | DELAYED_RELEASE_TABLET | Freq: Every day | ORAL | Status: DC
  Administered 2021-11-23: 81 mg via ORAL
  Filled 2021-11-23: qty 1

## 2021-11-23 MED ORDER — ZOLPIDEM TARTRATE 5 MG PO TABS
5.0000 mg | ORAL_TABLET | Freq: Every evening | ORAL | Status: DC | PRN
Start: 2021-11-23 — End: 2021-11-24
  Administered 2021-11-23: 5 mg via ORAL
  Filled 2021-11-23: qty 1

## 2021-11-23 MED ORDER — OXYMETAZOLINE HCL 0.05 % NA SOLN
2.0000 | Freq: Two times a day (BID) | NASAL | Status: DC | PRN
Start: 2021-11-23 — End: 2021-11-24
  Filled 2021-11-23: qty 30

## 2021-11-23 MED ORDER — BACITRACIN ZINC 500 UNIT/GM EX OINT: TOPICAL_OINTMENT | CUTANEOUS | Status: DC | PRN

## 2021-11-23 MED ORDER — DULOXETINE HCL 20 MG PO CPEP
20.0000 mg | ORAL_CAPSULE | Freq: Two times a day (BID) | ORAL | Status: DC
  Administered 2021-11-23: 20 mg via ORAL
  Filled 2021-11-23 (×2): qty 1

## 2021-11-23 MED ORDER — OXYCODONE HCL 5 MG PO TABS: 5.0000 mg | ORAL_TABLET | Freq: Once | ORAL | Status: DC | PRN

## 2021-11-23 MED ORDER — ROCURONIUM BROMIDE 10 MG/ML (PF) SYRINGE
PREFILLED_SYRINGE | INTRAVENOUS | Status: DC | PRN
  Administered 2021-11-23: 30 mg via INTRAVENOUS
  Administered 2021-11-23: 20 mg via INTRAVENOUS
  Administered 2021-11-23: 60 mg via INTRAVENOUS

## 2021-11-23 MED ORDER — ZOLPIDEM TARTRATE 5 MG PO TABS: 10.0000 mg | ORAL_TABLET | Freq: Every evening | ORAL | Status: DC | PRN

## 2021-11-23 MED ORDER — LORATADINE 10 MG PO TABS: 10.0000 mg | ORAL_TABLET | Freq: Every day | ORAL | Status: DC

## 2021-11-23 MED ORDER — LOSARTAN POTASSIUM 50 MG PO TABS: 50.0000 mg | ORAL_TABLET | Freq: Every day | ORAL | Status: DC

## 2021-11-23 MED ORDER — OXYCODONE HCL 5 MG/5ML PO SOLN: 5.0000 mg | Freq: Once | ORAL | Status: DC | PRN

## 2021-11-23 MED ORDER — DONEPEZIL HCL 10 MG PO TABS
10.0000 mg | ORAL_TABLET | Freq: Every day | ORAL | Status: DC
  Administered 2021-11-23: 10 mg via ORAL
  Filled 2021-11-23: qty 1

## 2021-11-23 MED ORDER — EPHEDRINE SULFATE-NACL 50-0.9 MG/10ML-% IV SOSY
PREFILLED_SYRINGE | INTRAVENOUS | Status: DC | PRN
  Administered 2021-11-23 (×3): 5 mg via INTRAVENOUS

## 2021-11-23 MED ORDER — LIDOCAINE-EPINEPHRINE 1 %-1:100000 IJ SOLN
INTRAMUSCULAR | Status: AC
  Filled 2021-11-23: qty 1

## 2021-11-23 MED ORDER — ARIPIPRAZOLE 10 MG PO TABS: 10.0000 mg | ORAL_TABLET | Freq: Every day | ORAL | Status: DC

## 2021-11-23 MED ORDER — CHLORHEXIDINE GLUCONATE 0.12 % MT SOLN
15.0000 mL | Freq: Once | OROMUCOSAL | Status: AC
  Administered 2021-11-23: 15 mL via OROMUCOSAL
  Filled 2021-11-23: qty 15

## 2021-11-23 MED ORDER — CELECOXIB 200 MG PO CAPS
200.0000 mg | ORAL_CAPSULE | Freq: Two times a day (BID) | ORAL | Status: DC
  Administered 2021-11-23 (×2): 200 mg via ORAL
  Filled 2021-11-23 (×3): qty 1

## 2021-11-23 MED ORDER — HYDROCODONE-ACETAMINOPHEN 10-325 MG PO TABS
1.0000 | ORAL_TABLET | Freq: Four times a day (QID) | ORAL | Status: DC | PRN
  Administered 2021-11-23 – 2021-11-24 (×2): 1 via ORAL
  Filled 2021-11-23 (×2): qty 1

## 2021-11-23 MED ORDER — KCL IN DEXTROSE-NACL 20-5-0.45 MEQ/L-%-% IV SOLN
INTRAVENOUS | Status: DC
  Filled 2021-11-23: qty 1000

## 2021-11-23 MED ORDER — MORPHINE SULFATE (PF) 2 MG/ML IV SOLN: 2.0000 mg | INTRAVENOUS | Status: DC | PRN

## 2021-11-23 MED ORDER — ATORVASTATIN CALCIUM 10 MG PO TABS: 20.0000 mg | ORAL_TABLET | Freq: Every day | ORAL | Status: DC

## 2021-11-23 MED ORDER — LIDOCAINE-EPINEPHRINE 1 %-1:100000 IJ SOLN
INTRAMUSCULAR | Status: DC | PRN
  Administered 2021-11-23: 9 mL

## 2021-11-23 MED ORDER — MUPIROCIN 2 % EX OINT
TOPICAL_OINTMENT | CUTANEOUS | Status: DC | PRN
  Administered 2021-11-23: 1 via NASAL

## 2021-11-23 MED ORDER — ONDANSETRON HCL 4 MG PO TABS: 4.0000 mg | ORAL_TABLET | ORAL | Status: DC | PRN

## 2021-11-23 SURGICAL SUPPLY — 49 items
BAG COUNTER SPONGE SURGICOUNT (BAG) ×2 IMPLANT
BAG SPNG CNTER NS LX DISP (BAG) ×1
BLADE INF TURB ROT M4 2 5PK (BLADE) ×1 IMPLANT
BLADE RAD40 ROTATE 4M 4 5PK (BLADE) IMPLANT
BLADE RAD60 ROTATE M4 4 5PK (BLADE) ×1 IMPLANT
BLADE ROTATE TRICUT 4X13 M4 (BLADE) ×1 IMPLANT
BLADE TRICUT ROTATE M4 4 5PK (BLADE) ×2 IMPLANT
CANISTER SUCT 3000ML PPV (MISCELLANEOUS) ×2 IMPLANT
CLSR STERI-STRIP ANTIMIC 1/2X4 (GAUZE/BANDAGES/DRESSINGS) ×1 IMPLANT
COAGULATOR SUCT SWTCH 10FR 6 (ELECTROSURGICAL) IMPLANT
DRESSING NASAL POPE 10X1.5X2.5 (GAUZE/BANDAGES/DRESSINGS) IMPLANT
DRSG NASAL POPE 10X1.5X2.5 (GAUZE/BANDAGES/DRESSINGS)
DRSG NASOPORE 8CM (GAUZE/BANDAGES/DRESSINGS) ×1 IMPLANT
DRSG TELFA 3X8 NADH (GAUZE/BANDAGES/DRESSINGS) IMPLANT
ELECT REM PT RETURN 9FT ADLT (ELECTROSURGICAL) ×2
ELECTRODE REM PT RTRN 9FT ADLT (ELECTROSURGICAL) IMPLANT
FILTER ARTHROSCOPY CONVERTOR (FILTER) ×2 IMPLANT
GAUZE SPONGE 4X4 12PLY STRL LF (GAUZE/BANDAGES/DRESSINGS) ×1 IMPLANT
GLOVE BIO SURGEON STRL SZ7.5 (GLOVE) ×2 IMPLANT
GOWN STRL REUS W/ TWL LRG LVL3 (GOWN DISPOSABLE) ×2 IMPLANT
GOWN STRL REUS W/TWL LRG LVL3 (GOWN DISPOSABLE) ×4
KIT BASIN OR (CUSTOM PROCEDURE TRAY) ×2 IMPLANT
KIT TURNOVER KIT B (KITS) ×2 IMPLANT
NDL HYPO 25GX1X1/2 BEV (NEEDLE) IMPLANT
NDL PRECISIONGLIDE 27X1.5 (NEEDLE) ×1 IMPLANT
NDL SPNL 22GX3.5 QUINCKE BK (NEEDLE) ×1 IMPLANT
NEEDLE HYPO 25GX1X1/2 BEV (NEEDLE) IMPLANT
NEEDLE PRECISIONGLIDE 27X1.5 (NEEDLE) ×2 IMPLANT
NEEDLE SPNL 22GX3.5 QUINCKE BK (NEEDLE) ×2 IMPLANT
NS IRRIG 1000ML POUR BTL (IV SOLUTION) ×2 IMPLANT
PAD ARMBOARD 7.5X6 YLW CONV (MISCELLANEOUS) ×4 IMPLANT
PAD DRESSING TELFA 3X8 NADH (GAUZE/BANDAGES/DRESSINGS) IMPLANT
PAD ENT ADHESIVE 25PK (MISCELLANEOUS) ×2 IMPLANT
PATTIES SURGICAL .5 X3 (DISPOSABLE) ×3 IMPLANT
POSITIONER HEAD DONUT 9IN (MISCELLANEOUS) ×1 IMPLANT
SHEATH ENDOSCRUB 0 DEG (SHEATH) ×2 IMPLANT
SHEATH ENDOSCRUB 30 DEG (SHEATH) ×2 IMPLANT
SHEATH ENDOSCRUB 45 DEG (SHEATH) ×1 IMPLANT
SOL ANTI FOG 6CC (MISCELLANEOUS) ×1 IMPLANT
SOLUTION ANTI FOG 6CC (MISCELLANEOUS) ×1
SUT ETHILON 3 0 PS 1 (SUTURE) IMPLANT
SWAB COLLECTION DEVICE MRSA (MISCELLANEOUS) IMPLANT
SYR 50ML SLIP (SYRINGE) IMPLANT
TRACKER ENT INSTRUMENT (MISCELLANEOUS) ×1 IMPLANT
TRACKER ENT PATIENT (MISCELLANEOUS) ×1 IMPLANT
TRAY ENT MC OR (CUSTOM PROCEDURE TRAY) ×2 IMPLANT
TUBE CONNECTING 12X1/4 (SUCTIONS) ×2 IMPLANT
TUBING STRAIGHTSHOT EPS 5PK (TUBING) ×1 IMPLANT
WATER STERILE IRR 1000ML POUR (IV SOLUTION) ×2 IMPLANT

## 2021-11-23 NOTE — Op Note (Signed)
PREOPERATIVE DIAGNOSIS:  Chronic pansinusitis, turbinate hypertrophy   POSTOPERATIVE DIAGNOSIS:  Chronic pansinusitis, turbinate hypertrophy   PROCEDURE:  Bilateral total ethmoidectomy, bilateral frontal sinus exploration, bilateral maxillary antrostomy, bilateral turbinate reduction, Fusion image guidance   SURGEON:  Melida Quitter, MD   ANESTHESIA:  General endotracheal anesthesia   COMPLICATIONS:  None   INDICATIONS:  The patient is a 71 year old male with a history of chronic or recurrent sinusitis and nasal obstruction with limited response to medical therapy.  He presents to the operating room for surgical management.   FINDINGS:  Narrow nasal and sinus pathways largely due to edema including enlarged turbinates.  Polypoid changes involving both middle meatus regions and through ethmoids.  Right maxillary opening obstructed, left patent but crowded by polyp changes.  Thick mucus in both frontal sinuses.   DESCRIPTION OF PROCEDURE:  The patient was identified in the holding room, informed consent having been obtained including discussion of risks, benefits and alternatives, the patient was brought to the operative suite and put the operative table in the supine position.  Anesthesia was induced and the patient was intubated by the anesthesia team without difficulty.  The eyes were lubricated and the Fusion antenna was placed.  The patient was given intravenous antibiotics during the case.  The face was prepped and draped in sterile fashion.  The patient was registered to the Fusion system in the standard fashion.  The eyes were taped closed and Afrin-soaked pledgets were placed in both sides of the nose.  After registering instruments, the right-sided pledgets were removed and the nasal passage was inspected with a straight telescope.  The lateral nasal wall was injected with local anesthetic.  Visualization was difficult.  Polyp tissue was removed from the middle meatus using the debrider and  dissection continued into the ethmoid.  Dissection was performed alternating with periods of Afrin-soaked pledget placement.  The left side was likewise dissected after injecting with local anesthetic to the lateral nasal wall.  By alternating, dissection was ultimately performed in each side through the anterior and posterior ethmoid spaces.  Image guidance was used to assist.  An angled telescope was used to evaluate the maxillary opening on the right side that was widened posteriorly and inferiorly.  On the left, the opening was patent but polyp tissue was removed from around the opening.  The anterior skull base was then inspected with an angled telescope.  Septations were removed using the microdebrider in a retrograde fashion to the frontal recess.  Image guidance was used.  The frontal recess was then dissected of septations using the microdebrider.  The natural frontal recess was identified and surround septations removed leaving a widely patent pathway.  Image guidance was used.  At this point, Afrin-soaked pledgets were placed in the sinuses.  This was performed on both sides.  The inferior turbinates were injected with local anesthetic.  An incision was made at the anterior head of each inferior turbinate and soft tissues were elevated off of the underlying bone using a freer elevator.  The microdebrider was then used with the turbinate blade to remove submucosal tissue keeping the overlying mucosa and underlying bone intact.  Soft tissues were then redraped and the turbinates out-fractured.  The nasal passage was suctioned.  The sinuses were again inspected.  Some additional minor work was done to clear small fragments and to smooth surfaces.  Half of a Nasopore pack was then placed in each ethmoid sinus coated with mupirocin ointment.  Each pack was saturated with  saline.  Afrin-soaked pledgets were placed in each nasal passage after the nasal passages and the throat were suctioned.   Drapes were  removed and the patient was cleaned off.  The patient was returned to anesthesia for wakeup, extubated, and taken to the recovery room in stable condition.  Pledgets were removed after extubation.
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
## 2021-11-23 NOTE — Anesthesia Procedure Notes (Signed)
Procedure Name: Intubation Date/Time: 11/23/2021 10:14 AM  Performed by: Griffin Dakin, CRNAPre-anesthesia Checklist: Patient identified, Emergency Drugs available, Suction available and Patient being monitored Patient Re-evaluated:Patient Re-evaluated prior to induction Oxygen Delivery Method: Circle system utilized Preoxygenation: Pre-oxygenation with 100% oxygen Induction Type: IV induction Ventilation: Oral airway inserted - appropriate to patient size and Two handed mask ventilation required Laryngoscope Size: Mac and 4 Grade View: Grade I Tube type: Oral Tube size: 7.5 mm Number of attempts: 1 Airway Equipment and Method: Stylet and Oral airway Placement Confirmation: ETT inserted through vocal cords under direct vision, positive ETCO2 and breath sounds checked- equal and bilateral Secured at: 23 cm Tube secured with: Tape Dental Injury: Teeth and Oropharynx as per pre-operative assessment

## 2021-11-23 NOTE — Transfer of Care (Signed)
Immediate Anesthesia Transfer of Care Note  Patient: Travis WELZ Sr.  Procedure(s) Performed: Bilateral total ethmoidectomy. Bilateral frontal recess exploration. Bilateral maxillary antrostomy fusion image guidance. Bilateral turbinate reduction.  (Bilateral: Nose)  Patient Location: PACU  Anesthesia Type:General  Level of Consciousness: awake, alert  and oriented  Airway & Oxygen Therapy: Patient Spontanous Breathing  Post-op Assessment: Report given to RN and Post -op Vital signs reviewed and stable  Post vital signs: Reviewed and stable  Last Vitals:  Vitals Value Taken Time  BP 141/65 11/23/21 1221  Temp    Pulse 81 11/23/21 1224  Resp 6 11/23/21 1224  SpO2 90 % 11/23/21 1224  Vitals shown include unvalidated device data.  Last Pain:  Vitals:   11/23/21 0835  TempSrc:   PainSc: 10-Worst pain ever      Patients Stated Pain Goal: 4 (64/35/39 1225)  Complications: No notable events documented.

## 2021-11-23 NOTE — Plan of Care (Signed)

## 2021-11-23 NOTE — Brief Op Note (Signed)
11/23/2021  12:15 PM  PATIENT:  Travis Novak Sr.  71 y.o. male  PRE-OPERATIVE DIAGNOSIS:  Chronic pansinusitis Nasal turbinate hypertrophy  POST-OPERATIVE DIAGNOSIS:  Chronic pansinusitis Nasal turbinate hypertrophy  PROCEDURE:  Procedure(s): Bilateral total ethmoidectomy. Bilateral frontal recess exploration. Bilateral maxillary antrostomy fusion image guidance. Bilateral turbinate reduction.  (Bilateral)  SURGEON:  Surgeon(s) and Role:    * Melida Quitter, MD - Primary  PHYSICIAN ASSISTANT:   ASSISTANTS: none   ANESTHESIA:   general  EBL:  250 cc   BLOOD ADMINISTERED:none  DRAINS: none   LOCAL MEDICATIONS USED:  LIDOCAINE   SPECIMEN:  Source of Specimen:  Sinus contents  DISPOSITION OF SPECIMEN:  PATHOLOGY  COUNTS:  YES  TOURNIQUET:  * No tourniquets in log *  DICTATION: .Note written in EPIC  PLAN OF CARE: Admit for overnight observation  PATIENT DISPOSITION:  PACU - hemodynamically stable.   Delay start of Pharmacological VTE agent (>24hrs) due to surgical blood loss or risk of bleeding: yes

## 2021-11-24 ENCOUNTER — Encounter (HOSPITAL_COMMUNITY): Payer: Self-pay | Admitting: Otolaryngology

## 2021-11-24 LAB — SURGICAL PATHOLOGY

## 2021-11-24 MED ORDER — AMOXICILLIN-POT CLAVULANATE 875-125 MG PO TABS: 1.0000 | ORAL_TABLET | Freq: Two times a day (BID) | ORAL | 0 refills | Status: DC

## 2021-11-24 MED ORDER — PREDNISONE 10 MG PO TABS: ORAL_TABLET | ORAL | 0 refills | Status: DC

## 2021-11-24 NOTE — Anesthesia Postprocedure Evaluation (Signed)
Anesthesia Post Note  Patient: Travis PETTIS Sr.  Procedure(s) Performed: Bilateral total ethmoidectomy. Bilateral frontal recess exploration. Bilateral maxillary antrostomy fusion image guidance. Bilateral turbinate reduction.  (Bilateral: Nose)     Patient location during evaluation: PACU Anesthesia Type: General Level of consciousness: awake and alert Pain management: pain level controlled Vital Signs Assessment: post-procedure vital signs reviewed and stable Respiratory status: spontaneous breathing, nonlabored ventilation, respiratory function stable and patient connected to nasal cannula oxygen Cardiovascular status: blood pressure returned to baseline and stable Postop Assessment: no apparent nausea or vomiting Anesthetic complications: no   No notable events documented.  Last Vitals:  Vitals:   11/23/21 2321 11/24/21 0429  BP: (!) 102/56 116/61  Pulse: 69 67  Resp: 16 18  Temp: 36.7 C 36.6 C  SpO2: 94% 99%    Last Pain:  Vitals:   11/24/21 0429  TempSrc: Oral  PainSc:                  [place]

## 2021-11-24 NOTE — Discharge Summary (Signed)
Physician Discharge Summary  Patient ID: Travis MELOCHE Sr. MRN: [id number] DOB/AGE: [birth year] 71 y.o.  Admit date: 11/23/2021 Discharge date: 11/24/2021  Admission Diagnoses: Chronic sinusitis and turbinate hypertrophy  Discharge Diagnoses:  Principal Problem:   Chronic sinusitis Turbinate hypertrophy  Discharged Condition: good  Hospital Course: 71 year old male presented for endoscopic sinus surgery and turbinate reduction.  See operative note.  He was observed overnight in the hospital and did quite well with some bleeding that has stopped by POD 1.  He is felt stable for discharge.  Consults: None  Significant Diagnostic Studies: None  Treatments: surgery: Bilateral endoscopic sinus surgery and turbinate reduction  Discharge Exam: Blood pressure 116/61, pulse 72, temperature 97.8 F (36.6 C), temperature source Oral, resp. rate 17, height 6' (1.829 m), weight 116.1 kg, SpO2 97 %. General appearance: alert, cooperative, and no distress Nose: no active bleeding  Disposition: Discharge disposition: 01-Home or Self Care       Discharge Instructions     Diet - low sodium heart healthy   Complete by: As directed    Discharge instructions   Complete by: As directed    Avoid nose blowing or strenuous activity.  Use saline spray in each side of nose every 2-4 hours.  Use Afrin spray if bleeding becomes brisk.  Change drip pad as needed until bleeding stops.  Resume prasugrel (Effient) 1-2 days after all bleeding has stopped.   Increase activity slowly   Complete by: As directed    No wound care   Complete by: As directed       Allergies as of 11/24/2021       Reactions   Buprenorphine Rash        Medication List     TAKE these medications    albuterol 108 (90 Base) MCG/ACT inhaler Commonly known as: VENTOLIN HFA Inhale 2 puffs into the lungs every 6 (six) hours as needed for wheezing or shortness of breath.   amoxicillin-clavulanate 875-125 MG  tablet Commonly known as: AUGMENTIN Take 1 tablet by mouth every 12 (twelve) hours.   ARIPiprazole 10 MG tablet Commonly known as: ABILIFY Take 1 tablet (10 mg total) by mouth daily.   aspirin EC 81 MG tablet Take 81 mg by mouth at bedtime. Swallow whole.   atorvastatin 20 MG tablet Commonly known as: LIPITOR Take 1 tablet (20 mg total) by mouth daily.   Azelastine-Fluticasone 137-50 MCG/ACT Susp Use both nostrils as directed twice daily   Belbuca 600 MCG Film Generic drug: Buprenorphine HCl Place 600 mcg inside cheek in the morning and at bedtime.   calcium carbonate 500 MG chewable tablet Commonly known as: TUMS - dosed in mg elemental calcium Chew 1 tablet by mouth 3 (three) times daily with meals.   celecoxib 200 MG capsule Commonly known as: CELEBREX Take 1 capsule (200 mg total) by mouth 2 (two) times daily.   cetirizine 10 MG tablet Commonly known as: ZYRTEC Take 1 tablet (10 mg total) by mouth daily.   diclofenac Sodium 1 % Gel Commonly known as: VOLTAREN Apply 4 g topically 3 (three) times daily as needed (pain).   donepezil 10 MG tablet Commonly known as: ARICEPT Take 1 tablet (10 mg total) by mouth at bedtime.   DULoxetine 20 MG capsule Commonly known as: CYMBALTA Take 1 capsule (20 mg total) by mouth 2 (two) times daily.   Fish Oil 1000 MG Caps Take 2,000 mg by mouth daily.   gabapentin 800 MG tablet Commonly known as:  NEURONTIN Take 800 mg by mouth 3 (three) times daily.   hydrochlorothiazide 25 MG tablet Commonly known as: HYDRODIURIL Take 1 tablet (25 mg total) by mouth daily.   HYDROcodone-acetaminophen 10-325 MG tablet Commonly known as: NORCO Take 1 tablet by mouth every 6 (six) hours as needed.   IRON PO Take 1 tablet by mouth daily.   losartan 50 MG tablet Commonly known as: COZAAR TAKE ONE TABLET BY MOUTH EVERY DAY   methocarbamol 500 MG tablet Commonly known as: ROBAXIN TAKE ONE TABLET BY MOUTH EVERY EIGHT HOURS AS NEEDED  musle SPASMS   Myrbetriq 50 MG Tb24 tablet Generic drug: mirabegron ER Take 50 mg by mouth at bedtime.   oxymetazoline 0.05 % nasal spray Commonly known as: AFRIN Place 1 spray into both nostrils 2 (two) times daily as needed for congestion.   Ozempic (0.25 or 0.5 MG/DOSE) 2 MG/1.5ML Sopn Generic drug: Semaglutide(0.25 or 0.5MG/DOS) Inject 0.5 mg into the skin once a week.   prasugrel 5 MG Tabs tablet Commonly known as: EFFIENT Take 1 tablet (5 mg total) by mouth daily.   predniSONE 10 MG tablet Commonly known as: DELTASONE Take 4 tabs by mouth once daily for 7 days then decrease by 1 tab every three days.   Symbicort 160-4.5 MCG/ACT inhaler Generic drug: budesonide-formoterol Inhale 2 puffs into the lungs 2 (two) times daily.   tamsulosin 0.4 MG Caps capsule Commonly known as: FLOMAX Take 0.4 mg by mouth 2 (two) times daily.   Testosterone 1.62 % Gel Place 1 Pump onto the skin daily.   traZODone 100 MG tablet Commonly known as: DESYREL Take 1 tablet (100 mg total) by mouth at bedtime.   VITAMIN D3 PO Take 2 tablets by mouth at bedtime.   Zenpep 40000-126000 units Cpep Generic drug: Pancrelipase (Lip-Prot-Amyl) Take 2 capsules (80,000 units) with meals and 1 capsule (40,000 units) with each snack (2-3 snacks/day)   zolpidem 10 MG tablet Commonly known as: AMBIEN Take 1 tablet (10 mg total) by mouth at bedtime as needed for sleep. What changed: when to take this        Follow-up Information     Melida Quitter, MD. Schedule an appointment as soon as possible for a visit in 2 week(s).   Specialty: Otolaryngology Contact information: [address] [place] [address] [phone]                 Signed: Melida Quitter 11/24/2021, 7:49 AM

## 2021-11-24 NOTE — Progress Notes (Signed)
Travis Novak Sr. to be D/C'd  per MD order.  Discussed with the patient and all questions fully answered.  VSS, Skin clean, dry and intact without evidence of skin break down, no evidence of skin tears noted.  IV catheter discontinued intact. Site without signs and symptoms of complications. Dressing and pressure applied.  An After Visit Summary was printed and given to the patient. Patient received prescription.  D/c education completed with patient/family including follow up instructions, medication list, d/c activities limitations if indicated, with other d/c instructions as indicated by MD - patient able to verbalize understanding, all questions fully answered.   Patient instructed to return to ED, call 911, or call MD for any changes in condition.   Patient to be escorted via [place], and D/C home via private auto.

## 2021-11-24 NOTE — Plan of Care (Signed)
  Problem: Education: Goal: Knowledge of General Education information will improve Description: Including pain rating scale, medication(s)/side effects and non-pharmacologic comfort measures Outcome: Completed/Met   Problem: Health Behavior/Discharge Planning: Goal: Ability to manage health-related needs will improve Outcome: Completed/Met   Problem: Clinical Measurements: Goal: Ability to maintain clinical measurements within normal limits will improve Outcome: Completed/Met Goal: Will remain free from infection Outcome: Completed/Met Goal: Diagnostic test results will improve Outcome: Completed/Met Goal: Respiratory complications will improve Outcome: Completed/Met Goal: Cardiovascular complication will be avoided Outcome: Completed/Met   Problem: Activity: Goal: Risk for activity intolerance will decrease Outcome: Completed/Met   Problem: Nutrition: Goal: Adequate nutrition will be maintained Outcome: Completed/Met   Problem: Coping: Goal: Level of anxiety will decrease Outcome: Completed/Met   Problem: Elimination: Goal: Will not experience complications related to bowel motility Outcome: Completed/Met Goal: Will not experience complications related to urinary retention Outcome: Completed/Met   Problem: Pain Managment: Goal: General experience of comfort will improve Outcome: Completed/Met   Problem: Safety: Goal: Ability to remain free from injury will improve Outcome: Completed/Met   Problem: Skin Integrity: Goal: Risk for impaired skin integrity will decrease Outcome: Completed/Met   Problem: Education: Goal: Knowledge of General Education information will improve Description: Including pain rating scale, medication(s)/side effects and non-pharmacologic comfort measures Outcome: Completed/Met   Problem: Health Behavior/Discharge Planning: Goal: Ability to manage health-related needs will improve Outcome: Completed/Met   Problem: Clinical  Measurements: Goal: Ability to maintain clinical measurements within normal limits will improve Outcome: Completed/Met Goal: Will remain free from infection Outcome: Completed/Met Goal: Diagnostic test results will improve Outcome: Completed/Met Goal: Respiratory complications will improve Outcome: Completed/Met Goal: Cardiovascular complication will be avoided Outcome: Completed/Met   Problem: Activity: Goal: Risk for activity intolerance will decrease Outcome: Completed/Met   Problem: Nutrition: Goal: Adequate nutrition will be maintained Outcome: Completed/Met   Problem: Coping: Goal: Level of anxiety will decrease Outcome: Completed/Met   Problem: Elimination: Goal: Will not experience complications related to bowel motility Outcome: Completed/Met Goal: Will not experience complications related to urinary retention Outcome: Completed/Met   Problem: Pain Managment: Goal: General experience of comfort will improve Outcome: Completed/Met   Problem: Safety: Goal: Ability to remain free from injury will improve Outcome: Completed/Met   Problem: Skin Integrity: Goal: Risk for impaired skin integrity will decrease Outcome: Completed/Met

## 2021-11-25 ENCOUNTER — Emergency Department (HOSPITAL_COMMUNITY): Payer: Medicare Other

## 2021-11-25 ENCOUNTER — Inpatient Hospital Stay (HOSPITAL_COMMUNITY): Payer: Medicare Other

## 2021-11-25 ENCOUNTER — Other Ambulatory Visit: Payer: Self-pay

## 2021-11-25 ENCOUNTER — Inpatient Hospital Stay (HOSPITAL_COMMUNITY)
Admission: EM | Admit: 2021-11-25 | Discharge: 2021-11-29 | DRG: 854 | Disposition: A | Discharge status: Home Health | Payer: Medicare Other | Attending: Student | Admitting: Student

## 2021-11-25 ENCOUNTER — Encounter (HOSPITAL_COMMUNITY): Payer: Self-pay | Admitting: Emergency Medicine

## 2021-11-25 DIAGNOSIS — G4733 Obstructive sleep apnea (adult) (pediatric): Secondary | ICD-10-CM | POA: Diagnosis not present

## 2021-11-25 DIAGNOSIS — M159 Polyosteoarthritis, unspecified: Secondary | ICD-10-CM | POA: Diagnosis present

## 2021-11-25 DIAGNOSIS — E669 Obesity, unspecified: Secondary | ICD-10-CM | POA: Diagnosis present

## 2021-11-25 DIAGNOSIS — J324 Chronic pansinusitis: Secondary | ICD-10-CM | POA: Diagnosis present

## 2021-11-25 DIAGNOSIS — A4151 Sepsis due to Escherichia coli [E. coli]: Secondary | ICD-10-CM | POA: Diagnosis present

## 2021-11-25 DIAGNOSIS — Z8249 Family history of ischemic heart disease and other diseases of the circulatory system: Secondary | ICD-10-CM | POA: Diagnosis not present

## 2021-11-25 DIAGNOSIS — A042 Enteroinvasive Escherichia coli infection: Secondary | ICD-10-CM

## 2021-11-25 DIAGNOSIS — N39498 Other specified urinary incontinence: Secondary | ICD-10-CM | POA: Diagnosis present

## 2021-11-25 DIAGNOSIS — A419 Sepsis, unspecified organism: Secondary | ICD-10-CM | POA: Diagnosis not present

## 2021-11-25 DIAGNOSIS — Z888 Allergy status to other drugs, medicaments and biological substances status: Secondary | ICD-10-CM

## 2021-11-25 DIAGNOSIS — Z7982 Long term (current) use of aspirin: Secondary | ICD-10-CM

## 2021-11-25 DIAGNOSIS — G894 Chronic pain syndrome: Secondary | ICD-10-CM | POA: Diagnosis not present

## 2021-11-25 DIAGNOSIS — Z981 Arthrodesis status: Secondary | ICD-10-CM | POA: Diagnosis not present

## 2021-11-25 DIAGNOSIS — F320 Major depressive disorder, single episode, mild: Secondary | ICD-10-CM | POA: Diagnosis not present

## 2021-11-25 DIAGNOSIS — Z8371 Family history of colonic polyps: Secondary | ICD-10-CM

## 2021-11-25 DIAGNOSIS — Z89511 Acquired absence of right leg below knee: Secondary | ICD-10-CM

## 2021-11-25 DIAGNOSIS — T502X5A Adverse effect of carbonic-anhydrase inhibitors, benzothiadiazides and other diuretics, initial encounter: Secondary | ICD-10-CM | POA: Diagnosis not present

## 2021-11-25 DIAGNOSIS — J343 Hypertrophy of nasal turbinates: Secondary | ICD-10-CM | POA: Diagnosis present

## 2021-11-25 DIAGNOSIS — R079 Chest pain, unspecified: Secondary | ICD-10-CM | POA: Diagnosis not present

## 2021-11-25 DIAGNOSIS — K8020 Calculus of gallbladder without cholecystitis without obstruction: Secondary | ICD-10-CM | POA: Diagnosis not present

## 2021-11-25 DIAGNOSIS — I252 Old myocardial infarction: Secondary | ICD-10-CM | POA: Diagnosis not present

## 2021-11-25 DIAGNOSIS — N401 Enlarged prostate with lower urinary tract symptoms: Secondary | ICD-10-CM | POA: Diagnosis not present

## 2021-11-25 DIAGNOSIS — Z833 Family history of diabetes mellitus: Secondary | ICD-10-CM

## 2021-11-25 DIAGNOSIS — F329 Major depressive disorder, single episode, unspecified: Secondary | ICD-10-CM | POA: Diagnosis present

## 2021-11-25 DIAGNOSIS — E1169 Type 2 diabetes mellitus with other specified complication: Secondary | ICD-10-CM

## 2021-11-25 DIAGNOSIS — I10 Essential (primary) hypertension: Secondary | ICD-10-CM

## 2021-11-25 DIAGNOSIS — E1142 Type 2 diabetes mellitus with diabetic polyneuropathy: Secondary | ICD-10-CM | POA: Diagnosis present

## 2021-11-25 DIAGNOSIS — R509 Fever, unspecified: Secondary | ICD-10-CM

## 2021-11-25 DIAGNOSIS — Z955 Presence of coronary angioplasty implant and graft: Secondary | ICD-10-CM | POA: Diagnosis not present

## 2021-11-25 DIAGNOSIS — Z83438 Family history of other disorder of lipoprotein metabolism and other lipidemia: Secondary | ICD-10-CM

## 2021-11-25 DIAGNOSIS — E782 Mixed hyperlipidemia: Secondary | ICD-10-CM

## 2021-11-25 DIAGNOSIS — I2510 Atherosclerotic heart disease of native coronary artery without angina pectoris: Secondary | ICD-10-CM

## 2021-11-25 DIAGNOSIS — E1165 Type 2 diabetes mellitus with hyperglycemia: Secondary | ICD-10-CM

## 2021-11-25 DIAGNOSIS — Z87891 Personal history of nicotine dependence: Secondary | ICD-10-CM

## 2021-11-25 DIAGNOSIS — R197 Diarrhea, unspecified: Secondary | ICD-10-CM | POA: Diagnosis not present

## 2021-11-25 DIAGNOSIS — Z6834 Body mass index (BMI) 34.0-34.9, adult: Secondary | ICD-10-CM

## 2021-11-25 DIAGNOSIS — R112 Nausea with vomiting, unspecified: Secondary | ICD-10-CM

## 2021-11-25 DIAGNOSIS — D649 Anemia, unspecified: Secondary | ICD-10-CM

## 2021-11-25 DIAGNOSIS — N400 Enlarged prostate without lower urinary tract symptoms: Secondary | ICD-10-CM | POA: Diagnosis not present

## 2021-11-25 DIAGNOSIS — Z79899 Other long term (current) drug therapy: Secondary | ICD-10-CM

## 2021-11-25 DIAGNOSIS — R059 Cough, unspecified: Secondary | ICD-10-CM | POA: Diagnosis not present

## 2021-11-25 DIAGNOSIS — M545 Low back pain, unspecified: Secondary | ICD-10-CM

## 2021-11-25 DIAGNOSIS — E876 Hypokalemia: Secondary | ICD-10-CM | POA: Diagnosis not present

## 2021-11-25 DIAGNOSIS — M549 Dorsalgia, unspecified: Secondary | ICD-10-CM | POA: Diagnosis not present

## 2021-11-25 DIAGNOSIS — Z803 Family history of malignant neoplasm of breast: Secondary | ICD-10-CM

## 2021-11-25 DIAGNOSIS — Z96653 Presence of artificial knee joint, bilateral: Secondary | ICD-10-CM | POA: Diagnosis present

## 2021-11-25 DIAGNOSIS — E785 Hyperlipidemia, unspecified: Secondary | ICD-10-CM | POA: Diagnosis present

## 2021-11-25 DIAGNOSIS — Z7951 Long term (current) use of inhaled steroids: Secondary | ICD-10-CM

## 2021-11-25 DIAGNOSIS — M797 Fibromyalgia: Secondary | ICD-10-CM | POA: Diagnosis present

## 2021-11-25 DIAGNOSIS — Z800 Family history of malignant neoplasm of digestive organs: Secondary | ICD-10-CM

## 2021-11-25 DIAGNOSIS — J9620 Acute and chronic respiratory failure, unspecified whether with hypoxia or hypercapnia: Secondary | ICD-10-CM | POA: Diagnosis not present

## 2021-11-25 DIAGNOSIS — K5100 Ulcerative (chronic) pancolitis without complications: Secondary | ICD-10-CM

## 2021-11-25 DIAGNOSIS — Z818 Family history of other mental and behavioral disorders: Secondary | ICD-10-CM

## 2021-11-25 DIAGNOSIS — N3281 Overactive bladder: Secondary | ICD-10-CM | POA: Diagnosis present

## 2021-11-25 DIAGNOSIS — R0600 Dyspnea, unspecified: Secondary | ICD-10-CM | POA: Diagnosis not present

## 2021-11-25 DIAGNOSIS — K8689 Other specified diseases of pancreas: Secondary | ICD-10-CM

## 2021-11-25 DIAGNOSIS — Z8042 Family history of malignant neoplasm of prostate: Secondary | ICD-10-CM

## 2021-11-25 DIAGNOSIS — Z808 Family history of malignant neoplasm of other organs or systems: Secondary | ICD-10-CM

## 2021-11-25 DIAGNOSIS — K6389 Other specified diseases of intestine: Secondary | ICD-10-CM | POA: Diagnosis not present

## 2021-11-25 DIAGNOSIS — R5381 Other malaise: Secondary | ICD-10-CM

## 2021-11-25 DIAGNOSIS — Z96612 Presence of left artificial shoulder joint: Secondary | ICD-10-CM | POA: Diagnosis present

## 2021-11-25 DIAGNOSIS — Z96643 Presence of artificial hip joint, bilateral: Secondary | ICD-10-CM | POA: Diagnosis present

## 2021-11-25 LAB — CBC WITH DIFFERENTIAL/PLATELET
Abs Immature Granulocytes: 0.05 10*3/uL (ref 0.00–0.07)
Basophils Absolute: 0 10*3/uL (ref 0.0–0.1)
Basophils Relative: 0 %
Eosinophils Absolute: 0 10*3/uL (ref 0.0–0.5)
Eosinophils Relative: 0 %
HCT: 41.4 % (ref 39.0–52.0)
Hemoglobin: 13.9 g/dL (ref 13.0–17.0)
Immature Granulocytes: 1 %
Lymphocytes Relative: 5 %
Lymphs Abs: 0.4 10*3/uL — ABNORMAL LOW (ref 0.7–4.0)
MCH: 31.1 pg (ref 26.0–34.0)
MCHC: 33.6 g/dL (ref 30.0–36.0)
MCV: 92.6 fL (ref 80.0–100.0)
Monocytes Absolute: 0.9 10*3/uL (ref 0.1–1.0)
Monocytes Relative: 12 %
Neutro Abs: 5.9 10*3/uL (ref 1.7–7.7)
Neutrophils Relative %: 82 %
Platelets: 164 10*3/uL (ref 150–400)
RBC: 4.47 MIL/uL (ref 4.22–5.81)
RDW: 13.6 % (ref 11.5–15.5)
WBC: 7.1 10*3/uL (ref 4.0–10.5)
nRBC: 0 % (ref 0.0–0.2)

## 2021-11-25 LAB — COMPREHENSIVE METABOLIC PANEL
ALT: 12 U/L (ref 0–44)
AST: 21 U/L (ref 15–41)
Albumin: 3.7 g/dL (ref 3.5–5.0)
Alkaline Phosphatase: 59 U/L (ref 38–126)
Anion gap: 11 (ref 5–15)
BUN: 20 mg/dL (ref 8–23)
CO2: 24 mmol/L (ref 22–32)
Calcium: 8.4 mg/dL — ABNORMAL LOW (ref 8.9–10.3)
Chloride: 102 mmol/L (ref 98–111)
Creatinine, Ser: 0.93 mg/dL (ref 0.61–1.24)
GFR, Estimated: >60 mL/min (ref >60)
Glucose, Bld: 147 mg/dL — ABNORMAL HIGH (ref 70–99)
Potassium: 3.6 mmol/L (ref 3.5–5.1)
Sodium: 137 mmol/L (ref 135–145)
Total Bilirubin: 0.9 mg/dL (ref 0.3–1.2)
Total Protein: 6.7 g/dL (ref 6.5–8.1)

## 2021-11-25 LAB — LACTIC ACID, PLASMA: Lactic Acid, Venous: 1.7 mmol/L (ref 0.5–1.9)

## 2021-11-25 MED ORDER — MIRABEGRON ER 25 MG PO TB24
50.0000 mg | ORAL_TABLET | Freq: Every day | ORAL | Status: DC
  Administered 2021-11-26 – 2021-11-28 (×3): 50 mg via ORAL
  Filled 2021-11-25 (×3): qty 2

## 2021-11-25 MED ORDER — ALBUTEROL SULFATE HFA 108 (90 BASE) MCG/ACT IN AERS
2.0000 | INHALATION_SPRAY | Freq: Four times a day (QID) | RESPIRATORY_TRACT | Status: DC | PRN
Start: 2021-11-25 — End: 2021-11-26

## 2021-11-25 MED ORDER — DULOXETINE HCL 20 MG PO CPEP
20.0000 mg | ORAL_CAPSULE | Freq: Two times a day (BID) | ORAL | Status: DC
  Administered 2021-11-26 – 2021-11-29 (×7): 20 mg via ORAL
  Filled 2021-11-25 (×7): qty 1

## 2021-11-25 MED ORDER — VANCOMYCIN HCL 2000 MG/400ML IV SOLN
2000.0000 mg | Freq: Once | INTRAVENOUS | Status: AC
  Administered 2021-11-26: 2000 mg via INTRAVENOUS
  Filled 2021-11-25: qty 400

## 2021-11-25 MED ORDER — METRONIDAZOLE 500 MG/100ML IV SOLN: 500.0000 mg | Freq: Two times a day (BID) | INTRAVENOUS | Status: DC

## 2021-11-25 MED ORDER — ONDANSETRON HCL 4 MG PO TABS: 4.0000 mg | ORAL_TABLET | Freq: Four times a day (QID) | ORAL | Status: DC | PRN

## 2021-11-25 MED ORDER — TRAZODONE HCL 50 MG PO TABS: 25.0000 mg | ORAL_TABLET | Freq: Every evening | ORAL | Status: DC | PRN

## 2021-11-25 MED ORDER — SODIUM CHLORIDE 0.9 % IV BOLUS
1000.0000 mL | Freq: Once | INTRAVENOUS | Status: AC
  Administered 2021-11-25: 1000 mL via INTRAVENOUS

## 2021-11-25 MED ORDER — VANCOMYCIN HCL IN DEXTROSE 1-5 GM/200ML-% IV SOLN: 1000.0000 mg | Freq: Two times a day (BID) | INTRAVENOUS | Status: DC

## 2021-11-25 MED ORDER — PANCRELIPASE (LIP-PROT-AMYL) 40000-126000 UNITS PO CPEP
1.0000 | ORAL_CAPSULE | ORAL | Status: DC
Start: 2021-11-25 — End: 2021-11-26

## 2021-11-25 MED ORDER — ARIPIPRAZOLE 10 MG PO TABS
10.0000 mg | ORAL_TABLET | Freq: Every day | ORAL | Status: DC
  Administered 2021-11-26 – 2021-11-28 (×3): 10 mg via ORAL
  Filled 2021-11-25 (×4): qty 1

## 2021-11-25 MED ORDER — ACETAMINOPHEN 650 MG RE SUPP: 650.0000 mg | Freq: Four times a day (QID) | RECTAL | Status: DC | PRN

## 2021-11-25 MED ORDER — ACETAMINOPHEN 325 MG PO TABS
650.0000 mg | ORAL_TABLET | Freq: Once | ORAL | Status: AC
  Administered 2021-11-25: 650 mg via ORAL
  Filled 2021-11-25: qty 2

## 2021-11-25 MED ORDER — AZELASTINE-FLUTICASONE 137-50 MCG/ACT NA SUSP
1.0000 | Freq: Two times a day (BID) | NASAL | Status: DC
Start: 2021-11-25 — End: 2021-11-26

## 2021-11-25 MED ORDER — BUPRENORPHINE HCL 600 MCG BU FILM: 600.0000 ug | ORAL_FILM | Freq: Two times a day (BID) | BUCCAL | Status: DC

## 2021-11-25 MED ORDER — VANCOMYCIN HCL IN DEXTROSE 1-5 GM/200ML-% IV SOLN
1000.0000 mg | Freq: Once | INTRAVENOUS | Status: DC
  Filled 2021-11-25: qty 200

## 2021-11-25 MED ORDER — OMEGA-3-ACID ETHYL ESTERS 1 G PO CAPS
2.0000 g | ORAL_CAPSULE | Freq: Every day | ORAL | Status: DC
  Administered 2021-11-26 – 2021-11-28 (×3): 2 g via ORAL
  Filled 2021-11-25 (×4): qty 2

## 2021-11-25 MED ORDER — IOHEXOL 300 MG/ML  SOLN
75.0000 mL | Freq: Once | INTRAMUSCULAR | Status: AC | PRN
  Administered 2021-11-25: 75 mL via INTRAVENOUS

## 2021-11-25 MED ORDER — ONDANSETRON HCL 4 MG/2ML IJ SOLN
4.0000 mg | Freq: Once | INTRAMUSCULAR | Status: AC
Start: 2021-11-25 — End: 2021-11-25
  Administered 2021-11-25: 4 mg via INTRAVENOUS
  Filled 2021-11-25: qty 2

## 2021-11-25 MED ORDER — LORATADINE 10 MG PO TABS
10.0000 mg | ORAL_TABLET | Freq: Every day | ORAL | Status: DC
  Administered 2021-11-26 – 2021-11-29 (×4): 10 mg via ORAL
  Filled 2021-11-25 (×4): qty 1

## 2021-11-25 MED ORDER — VITAMIN D3 25 MCG (1000 UNIT) PO TABS
1000.0000 [IU] | ORAL_TABLET | Freq: Every day | ORAL | Status: DC
  Administered 2021-11-26 – 2021-11-28 (×3): 1000 [IU] via ORAL
  Filled 2021-11-25 (×3): qty 1

## 2021-11-25 MED ORDER — MAGNESIUM HYDROXIDE 400 MG/5ML PO SUSP: 30.0000 mL | Freq: Every day | ORAL | Status: DC | PRN

## 2021-11-25 MED ORDER — CELECOXIB 200 MG PO CAPS
200.0000 mg | ORAL_CAPSULE | Freq: Two times a day (BID) | ORAL | Status: DC
  Administered 2021-11-26 – 2021-11-29 (×7): 200 mg via ORAL
  Filled 2021-11-25 (×7): qty 1

## 2021-11-25 MED ORDER — LOSARTAN POTASSIUM 50 MG PO TABS
50.0000 mg | ORAL_TABLET | Freq: Every day | ORAL | Status: DC
  Administered 2021-11-26 – 2021-11-28 (×3): 50 mg via ORAL
  Filled 2021-11-25 (×3): qty 1

## 2021-11-25 MED ORDER — MORPHINE SULFATE (PF) 4 MG/ML IV SOLN
4.0000 mg | Freq: Once | INTRAVENOUS | Status: AC
  Administered 2021-11-25: 4 mg via INTRAVENOUS
  Filled 2021-11-25: qty 1

## 2021-11-25 MED ORDER — HYDROCODONE-ACETAMINOPHEN 10-325 MG PO TABS
1.0000 | ORAL_TABLET | Freq: Four times a day (QID) | ORAL | Status: DC | PRN
  Administered 2021-11-26 – 2021-11-29 (×8): 1 via ORAL
  Filled 2021-11-25 (×8): qty 1

## 2021-11-25 MED ORDER — VANCOMYCIN HCL IN DEXTROSE 1-5 GM/200ML-% IV SOLN: 1000.0000 mg | Freq: Once | INTRAVENOUS | Status: DC

## 2021-11-25 MED ORDER — SODIUM CHLORIDE 0.9 % IV SOLN: 2.0000 g | Freq: Once | INTRAVENOUS | Status: DC

## 2021-11-25 MED ORDER — PRASUGREL HCL 5 MG PO TABS
5.0000 mg | ORAL_TABLET | Freq: Every day | ORAL | Status: DC
  Administered 2021-11-28 – 2021-11-29 (×2): 5 mg via ORAL
  Filled 2021-11-25 (×3): qty 1

## 2021-11-25 MED ORDER — SODIUM CHLORIDE 0.9 % IV SOLN: INTRAVENOUS | Status: DC

## 2021-11-25 MED ORDER — ZOLPIDEM TARTRATE 5 MG PO TABS
5.0000 mg | ORAL_TABLET | Freq: Every evening | ORAL | Status: DC | PRN
  Administered 2021-11-26 – 2021-11-28 (×3): 5 mg via ORAL
  Filled 2021-11-25 (×3): qty 1

## 2021-11-25 MED ORDER — LORAZEPAM 1 MG PO TABS
1.0000 mg | ORAL_TABLET | Freq: Once | ORAL | Status: AC
  Administered 2021-11-25: 1 mg via ORAL
  Filled 2021-11-25: qty 1

## 2021-11-25 MED ORDER — DONEPEZIL HCL 10 MG PO TABS
10.0000 mg | ORAL_TABLET | Freq: Every day | ORAL | Status: DC
  Administered 2021-11-26 – 2021-11-28 (×3): 10 mg via ORAL
  Filled 2021-11-25 (×3): qty 1

## 2021-11-25 MED ORDER — ONDANSETRON HCL 4 MG/2ML IJ SOLN
4.0000 mg | Freq: Four times a day (QID) | INTRAMUSCULAR | Status: DC | PRN
  Administered 2021-11-26 – 2021-11-29 (×3): 4 mg via INTRAVENOUS
  Filled 2021-11-25 (×3): qty 2

## 2021-11-25 MED ORDER — CALCIUM CARBONATE ANTACID 500 MG PO CHEW
1.0000 | CHEWABLE_TABLET | Freq: Three times a day (TID) | ORAL | Status: DC
  Administered 2021-11-26 – 2021-11-28 (×7): 200 mg via ORAL
  Filled 2021-11-25 (×7): qty 1

## 2021-11-25 MED ORDER — ACETAMINOPHEN 325 MG PO TABS: 650.0000 mg | ORAL_TABLET | Freq: Four times a day (QID) | ORAL | Status: DC | PRN

## 2021-11-25 MED ORDER — ASPIRIN 81 MG PO TBEC
81.0000 mg | DELAYED_RELEASE_TABLET | Freq: Every day | ORAL | Status: DC
  Administered 2021-11-26 – 2021-11-28 (×3): 81 mg via ORAL
  Filled 2021-11-25 (×3): qty 1

## 2021-11-25 MED ORDER — METRONIDAZOLE 500 MG/100ML IV SOLN
500.0000 mg | Freq: Once | INTRAVENOUS | Status: AC
Start: 2021-11-25 — End: 2021-11-25
  Administered 2021-11-25: 500 mg via INTRAVENOUS
  Filled 2021-11-25: qty 100

## 2021-11-25 MED ORDER — HYDROCHLOROTHIAZIDE 12.5 MG PO TABS: 25.0000 mg | ORAL_TABLET | Freq: Every day | ORAL | Status: DC

## 2021-11-25 MED ORDER — SODIUM CHLORIDE 0.9 % IV SOLN
2.0000 g | Freq: Three times a day (TID) | INTRAVENOUS | Status: DC
  Administered 2021-11-26: 2 g via INTRAVENOUS

## 2021-11-25 MED ORDER — ATORVASTATIN CALCIUM 20 MG PO TABS
20.0000 mg | ORAL_TABLET | Freq: Every day | ORAL | Status: DC
  Administered 2021-11-26 – 2021-11-28 (×3): 20 mg via ORAL
  Filled 2021-11-25: qty 1
  Filled 2021-11-25: qty 2
  Filled 2021-11-25 (×2): qty 1

## 2021-11-25 MED ORDER — TAMSULOSIN HCL 0.4 MG PO CAPS
0.4000 mg | ORAL_CAPSULE | Freq: Two times a day (BID) | ORAL | Status: DC
  Administered 2021-11-26 – 2021-11-29 (×7): 0.4 mg via ORAL
  Filled 2021-11-25 (×7): qty 1

## 2021-11-25 MED ORDER — TRAZODONE HCL 100 MG PO TABS
100.0000 mg | ORAL_TABLET | Freq: Every day | ORAL | Status: DC
Start: 2021-11-26 — End: 2021-11-29
  Administered 2021-11-26 – 2021-11-28 (×3): 100 mg via ORAL
  Filled 2021-11-25 (×3): qty 1

## 2021-11-25 MED ORDER — ENOXAPARIN SODIUM 60 MG/0.6ML IJ SOSY
55.0000 mg | PREFILLED_SYRINGE | INTRAMUSCULAR | Status: DC
  Administered 2021-11-28: 55 mg via SUBCUTANEOUS
  Filled 2021-11-25: qty 0.6
  Filled 2021-11-25: qty 0.55
  Filled 2021-11-25: qty 0.6

## 2021-11-25 MED ORDER — FERROUS SULFATE 325 (65 FE) MG PO TABS
ORAL_TABLET | Freq: Every day | ORAL | Status: DC
  Administered 2021-11-26 – 2021-11-29 (×4): 325 mg via ORAL
  Filled 2021-11-25 (×4): qty 1

## 2021-11-25 MED ORDER — METHOCARBAMOL 500 MG PO TABS
500.0000 mg | ORAL_TABLET | Freq: Three times a day (TID) | ORAL | Status: DC | PRN
Start: 2021-11-25 — End: 2021-11-29
  Administered 2021-11-27 – 2021-11-28 (×2): 500 mg via ORAL
  Filled 2021-11-25 (×2): qty 1

## 2021-11-25 MED ORDER — SODIUM CHLORIDE 0.9 % IV SOLN
2.0000 g | Freq: Once | INTRAVENOUS | Status: AC
  Administered 2021-11-25: 2 g via INTRAVENOUS
  Filled 2021-11-25: qty 12.5

## 2021-11-25 MED ORDER — GABAPENTIN 400 MG PO CAPS
800.0000 mg | ORAL_CAPSULE | Freq: Three times a day (TID) | ORAL | Status: DC
  Administered 2021-11-26 – 2021-11-29 (×10): 800 mg via ORAL
  Filled 2021-11-25 (×10): qty 2

## 2021-11-25 NOTE — H&P (Incomplete)
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 continue  statin therapy   DVT prophylaxis: Lovenox.  Advanced Care Planning:  Code Status: Partial code.  He is DNI only Family Communication:  The plan of care was discussed in details with the patient (and family). I answered all questions. The patient agreed to proceed with the above mentioned plan. Further management will depend upon hospital course. Disposition Plan: Back to previous home environment Consults called: none.  All the records are reviewed and case discussed with ED provider.  Status is: Inpatient  At the time of the admission, it appears that the appropriate admission status for this patient is inpatient.  This is judged to be reasonable and necessary in order to provide the required intensity of service to ensure the patient's safety given the presenting symptoms, physical exam findings and initial radiographic and laboratory data in the  context of comorbid conditions.  The patient requires inpatient status due to high intensity of service, high risk of further deterioration and high frequency of surveillance required.  I certify that at the time of admission, it is my clinical judgment that the patient will require inpatient hospital care extending more than 2 midnights.                            Dispo: The patient is from: Home              Anticipated d/c is to: Home              Patient currently is not medically stable to d/c.              Difficult to place patient: No  Christel Mormon M.D on 11/26/2021 at 3:38 AM  [facility]   From 7 PM-7 AM, contact night-coverage [email or web address]  CC: Primary care physician; [name], MD

## 2021-11-25 NOTE — ED Triage Notes (Addendum)
Patient presents from home with complaints of increased back pain since last night. He is unable to move now due to the pain. Surgery has been schedules for a few weeks from now. Patient has been incontinent of bowel and bladder for a few weeks now.   Hx: right BKA  EMS vitals: 188/92 BP 94 HR 94% SPO2 on room air 129 CBG

## 2021-11-25 NOTE — Progress Notes (Signed)
Pharmacy Antibiotic Note  Travis [name]. is a 71 y.o. male admitted on 11/25/2021 with chronic back pain came to ED with altered mental status and fever.    Recent hospitalization for sinus surgery and turbinate reduction.  Pharmacy has been consulted for Vancomycin and Cefepime dosing.  Plan: Vancomycin 2g IV x 1 followed by Vancomycin 1000 mg IV Q 12 hrs. Goal AUC 400-550.  Expected AUC: 473.9   SCr used: 0.93 Cefepime 2gm IV q8h Metronidazole per MD Follow renal function F/u culture results and sensitivities     Temp (24hrs), Avg:101.3 F (38.5 C), Min:100.5 F (38.1 C), Max:102.1 F (38.9 C)  Recent Labs  Lab 11/25/21 1717  WBC 7.1  CREATININE 0.93  LATICACIDVEN 1.7    Estimated Creatinine Clearance: 98.6 mL/min (by C-G formula based on SCr of 0.93 mg/dL).    Allergies  Allergen Reactions   Buprenorphine Rash    Antimicrobials this admission: 7/7 Vanc >>   7/7 Cefepime >>   7/7 Metronidazole >>  Dose adjustments this admission:    Microbiology results: 7/7 BCx:      Thank you for allowing pharmacy to be a part of this patient's care.  Everette Rank, PharmD 11/25/2021 11:23 PM

## 2021-11-25 NOTE — ED Notes (Signed)
Patient transported to MRI 

## 2021-11-25 NOTE — ED Provider Notes (Signed)
[facility] DEPT Provider Note   CSN: [id number] Arrival date & time: 11/25/21  1408     History  Chief Complaint  Patient presents with   Back Pain    Nishaan Stanke. is a 71 y.o. male.   Back Pain Associated symptoms: no abdominal pain, no chest pain, no dysuria and no fever    71 year old male presents emergency department with complaints of back pain.  Patient states that he has chronic history of lumbar back pain but pain is gotten worse over the past several days.  Patient notes inability to control his bowel/bladder that symptoms have been ongoing for the past 3 to 4 months.  Patient states that he is being referred by his primary care to her neurosurgeon to talk about back surgery regarding his condition, but is waiting on insurance to clear him for surgery.  He lives by himself. His son was contacted to help clarify story but son is estranged and does not know of his father's current condition.  Upon reassessment of the patient, he denied chest pain, shortness of breath, abdominal pain, nausea/vomiting/diarrhea, dysuria, known fever prior to arrival.    Home Medications Prior to Admission medications   Medication Sig Start Date End Date Taking? Authorizing Provider  albuterol (VENTOLIN HFA) 108 (90 Base) MCG/ACT inhaler Inhale 2 puffs into the lungs every 6 (six) hours as needed for wheezing or shortness of breath. 02/02/21   [name], MD  amoxicillin-clavulanate (AUGMENTIN) 875-125 MG tablet Take 1 tablet by mouth every 12 (twelve) hours. 11/24/21   Melida Quitter, MD  ARIPiprazole (ABILIFY) 10 MG tablet Take 1 tablet (10 mg total) by mouth daily. 09/28/21 09/28/22  Kathlee Nations, MD  aspirin EC 81 MG tablet Take 81 mg by mouth at bedtime. Swallow whole.    [provider]  atorvastatin (LIPITOR) 20 MG tablet Take 1 tablet (20 mg total) by mouth daily. 07/29/21   Burnell Blanks, MD  Azelastine-Fluticasone [phone] MCG/ACT  SUSP Use both nostrils as directed twice daily 07/05/20   [name], MD  Buprenorphine HCl (BELBUCA) 600 MCG FILM Place 600 mcg inside cheek in the morning and at bedtime.    [provider]  calcium carbonate (TUMS - DOSED IN MG ELEMENTAL CALCIUM) 500 MG chewable tablet Chew 1 tablet by mouth 3 (three) times daily with meals.    [provider]  celecoxib (CELEBREX) 200 MG capsule Take 1 capsule (200 mg total) by mouth 2 (two) times daily. 07/04/21   Jessy Oto, MD  cetirizine (ZYRTEC) 10 MG tablet Take 1 tablet (10 mg total) by mouth daily. 11/15/21 11/15/22  [name], MD  Cholecalciferol (VITAMIN D3 PO) Take 2 tablets by mouth at bedtime.    [provider]  diclofenac Sodium (VOLTAREN) 1 % GEL Apply 4 g topically 3 (three) times daily as needed (pain). 08/08/21   Jessy Oto, MD  donepezil (ARICEPT) 10 MG tablet Take 1 tablet (10 mg total) by mouth at bedtime. 11/17/21   [name], MD  DULoxetine (CYMBALTA) 20 MG capsule Take 1 capsule (20 mg total) by mouth 2 (two) times daily. 09/28/21   Arfeen, Arlyce Harman, MD  Ferrous Sulfate (IRON PO) Take 1 tablet by mouth daily.    [provider]  gabapentin (NEURONTIN) 800 MG tablet Take 800 mg by mouth 3 (three) times daily. 12/20/20   Jessy Oto, MD  hydrochlorothiazide (HYDRODIURIL) 25 MG tablet Take 1 tablet (  25 mg total) by mouth daily. 10/31/21   Burnell Blanks, MD  HYDROcodone-acetaminophen (NORCO) 10-325 MG tablet Take 1 tablet by mouth every 6 (six) hours as needed. 03/20/21   Meredith Pel, MD  losartan (COZAAR) 50 MG tablet TAKE ONE TABLET BY MOUTH EVERY DAY 10/04/21   [name], MD  methocarbamol (ROBAXIN) 500 MG tablet TAKE ONE TABLET BY MOUTH EVERY EIGHT HOURS AS NEEDED musle SPASMS 08/11/21   [name], MD  MYRBETRIQ 50 MG TB24 tablet Take 50 mg by mouth at bedtime. 02/21/21   [provider]  Omega-3 Fatty Acids (FISH OIL) 1000 MG CAPS Take 2,000 mg by mouth daily.     [provider]  oxymetazoline (AFRIN) 0.05 % nasal spray Place 1 spray into both nostrils 2 (two) times daily as needed for congestion.    [provider]  Pancrelipase, Lip-Prot-Amyl, (ZENPEP) 40000-126000 units CPEP Take 2 capsules (80,000 units) with meals and 1 capsule (40,000 units) with each snack (2-3 snacks/day) 07/21/21   Willia Craze, NP  prasugrel (EFFIENT) 5 MG TABS tablet Take 1 tablet (5 mg total) by mouth daily. 05/04/21   Burnell Blanks, MD  predniSONE (DELTASONE) 10 MG tablet Take 4 tabs by mouth once daily for 7 days then decrease by 1 tab every three days. 11/24/21   Melida Quitter, MD  Semaglutide,0.25 or 0.5MG/DOS, (OZEMPIC, 0.25 OR 0.5 MG/DOSE,) 2 MG/1.5ML SOPN Inject 0.5 mg into the skin once a week. 11/14/21   Laqueta Linden, MD  SYMBICORT 160-4.5 MCG/ACT inhaler Inhale 2 puffs into the lungs 2 (two) times daily. 11/23/21   [name], MD  tamsulosin (FLOMAX) 0.4 MG CAPS capsule Take 0.4 mg by mouth 2 (two) times daily. 06/30/20   [provider]  Testosterone 1.62 % GEL Place 1 Pump onto the skin daily. 11/14/21   [name], MD  traZODone (DESYREL) 100 MG tablet Take 1 tablet (100 mg total) by mouth at bedtime. 09/28/21   Arfeen, Arlyce Harman, MD  zolpidem (AMBIEN) 10 MG tablet Take 1 tablet (10 mg total) by mouth at bedtime as needed for sleep. Patient taking differently: Take 10 mg by mouth at bedtime. 11/07/21 12/07/21  [name], MD      Allergies    Buprenorphine    Review of Systems   Review of Systems  Constitutional:  Negative for chills and fever.  HENT:  Negative for ear pain and sore throat.   Eyes:  Negative for pain and visual disturbance.  Respiratory:  Negative for cough and shortness of breath.   Cardiovascular:  Negative for chest pain and palpitations.  Gastrointestinal:  Negative for abdominal pain and vomiting.  Genitourinary:  Negative for dysuria and hematuria.  Musculoskeletal:  Positive for back pain.  Negative for arthralgias.       Bowel/bladder dysfunction.  Skin:  Negative for color change and rash.  Neurological:  Negative for seizures and syncope.  All other systems reviewed and are negative.   Physical Exam Updated Vital Signs BP (!) 144/61   Pulse 97   Temp (!) 100.5 F (38.1 C) (Oral)   Resp 19   SpO2 92%  Physical Exam Vitals and nursing note reviewed.  Constitutional:      General: He is not in acute distress.    Appearance: Normal appearance. He is well-developed.  HENT:     Head: Normocephalic and atraumatic.     Mouth/Throat:     Mouth: Mucous membranes are  moist.     Pharynx: Oropharynx is clear.  Eyes:     Extraocular Movements: Extraocular movements intact.     Conjunctiva/sclera: Conjunctivae normal.  Cardiovascular:     Rate and Rhythm: Normal rate and regular rhythm.     Heart sounds: No murmur heard. Pulmonary:     Effort: Pulmonary effort is normal. No respiratory distress.     Breath sounds: Normal breath sounds.  Abdominal:     Palpations: Abdomen is soft.     Tenderness: There is no abdominal tenderness.  Musculoskeletal:        General: No swelling.     Cervical back: Neck supple.     Right lower leg: No edema.     Left lower leg: No edema.     Comments: Tenderness to palpation of midline lumbar spine.  Skin:    General: Skin is warm and dry.     Capillary Refill: Capillary refill takes less than 2 seconds.  Neurological:     General: No focal deficit present.     Mental Status: He is alert and oriented to person, place, and time.  Psychiatric:        Mood and Affect: Mood normal.     ED Results / Procedures / Treatments   Labs (all labs ordered are listed, but only abnormal results are displayed) Labs Reviewed  COMPREHENSIVE METABOLIC PANEL - Abnormal; Notable for the following components:      Result Value   Glucose, Bld 147 (*)    Calcium 8.4 (*)    All other components within normal limits  CBC WITH DIFFERENTIAL/PLATELET -  Abnormal; Notable for the following components:   Lymphs Abs 0.4 (*)    All other components within normal limits  CULTURE, BLOOD (ROUTINE X 2)  CULTURE, BLOOD (ROUTINE X 2)  URINE CULTURE  LACTIC ACID, PLASMA  URINALYSIS, ROUTINE W REFLEX MICROSCOPIC  LACTIC ACID, PLASMA  PROTIME-INR  CORTISOL-AM, BLOOD  PROCALCITONIN  BASIC METABOLIC PANEL  CBC    EKG None  Radiology CT Head Wo Contrast  Result Date: 11/25/2021 CLINICAL DATA:  Mental status change, unknown cause; Mastication paralysis/weakness (CN 5) EXAM: CT HEAD WITHOUT CONTRAST CT MAXILLOFACIAL WITHOUT CONTRAST TECHNIQUE: Multidetector CT imaging of the head and maxillofacial structures were performed using the standard protocol without intravenous contrast. Multiplanar CT image reconstructions of the maxillofacial structures were also generated. RADIATION DOSE REDUCTION: This exam was performed according to the departmental dose-optimization program which includes automated exposure control, adjustment of the mA and/or kV according to patient size and/or use of iterative reconstruction technique. COMPARISON:  None Available. FINDINGS: CT HEAD FINDINGS Brain: No evidence of acute infarction, hemorrhage, hydrocephalus, extra-axial collection or mass lesion/mass effect. Vascular: No hyperdense vessel identified. Skull: No acute fracture. Other: No mastoid effusions. CT MAXILLOFACIAL FINDINGS Motion limited. Osseous: No fracture or mandibular dislocation. No destructive process. Orbits: Negative. No traumatic or inflammatory finding. Sinuses: Severe paranasal sinus disease including and sinus mucosal thickening with air-fluid levels and areas of internal hyperdensity that could represent inspissated secretions and/or fungal colonization. The nasal cavity is partially opacified. Soft tissues: Motion limited without obvious abnormality. IMPRESSION: 1. No evidence of acute intracranial abnormality. 2. Severe, pansinus paranasal sinus disease,  described above. 3. No evidence of acute facial fracture on the motion limited maxillofacial CT. Electronically Signed   By: Margaretha Sheffield M.D.   On: 11/25/2021 19:28   CT Maxillofacial W Contrast  Result Date: 11/25/2021 CLINICAL DATA:  Mental status change, unknown cause; Mastication paralysis/weakness (  CN 5) EXAM: CT HEAD WITHOUT CONTRAST CT MAXILLOFACIAL WITHOUT CONTRAST TECHNIQUE: Multidetector CT imaging of the head and maxillofacial structures were performed using the standard protocol without intravenous contrast. Multiplanar CT image reconstructions of the maxillofacial structures were also generated. RADIATION DOSE REDUCTION: This exam was performed according to the departmental dose-optimization program which includes automated exposure control, adjustment of the mA and/or kV according to patient size and/or use of iterative reconstruction technique. COMPARISON:  None Available. FINDINGS: CT HEAD FINDINGS Brain: No evidence of acute infarction, hemorrhage, hydrocephalus, extra-axial collection or mass lesion/mass effect. Vascular: No hyperdense vessel identified. Skull: No acute fracture. Other: No mastoid effusions. CT MAXILLOFACIAL FINDINGS Motion limited. Osseous: No fracture or mandibular dislocation. No destructive process. Orbits: Negative. No traumatic or inflammatory finding. Sinuses: Severe paranasal sinus disease including and sinus mucosal thickening with air-fluid levels and areas of internal hyperdensity that could represent inspissated secretions and/or fungal colonization. The nasal cavity is partially opacified. Soft tissues: Motion limited without obvious abnormality. IMPRESSION: 1. No evidence of acute intracranial abnormality. 2. Severe, pansinus paranasal sinus disease, described above. 3. No evidence of acute facial fracture on the motion limited maxillofacial CT. Electronically Signed   By: Margaretha Sheffield M.D.   On: 11/25/2021 19:28   DG Chest Portable 1 View  Result  Date: 11/25/2021 CLINICAL DATA:  Increasing back pain, cough EXAM: PORTABLE CHEST 1 VIEW COMPARISON:  12/31/2020 FINDINGS: 2 frontal views of the chest demonstrate an unremarkable cardiac silhouette. No acute airspace disease, effusion, or pneumothorax. Postsurgical changes within the left shoulder and lumbar spine. No acute bony abnormality. IMPRESSION: 1. No acute intrathoracic process. Electronically Signed   By: Randa Ngo M.D.   On: 11/25/2021 18:21    Procedures Procedures    Medications Ordered in ED Medications  LORazepam (ATIVAN) tablet 1 mg (has no administration in time range)  ceFEPIme (MAXIPIME) 2 g in sodium chloride 0.9 % 100 mL IVPB (2 g Intravenous New Bag/Given 11/25/21 2210)  metroNIDAZOLE (FLAGYL) IVPB 500 mg (500 mg Intravenous New Bag/Given 11/25/21 2232)  vancomycin (VANCOCIN) IVPB 1000 mg/200 mL premix (has no administration in time range)  aspirin EC tablet 81 mg (has no administration in time range)  Buprenorphine HCl FILM 600 mcg (has no administration in time range)  celecoxib (CELEBREX) capsule 200 mg (has no administration in time range)  HYDROcodone-acetaminophen (NORCO) 10-325 MG per tablet 1 tablet (has no administration in time range)  atorvastatin (LIPITOR) tablet 20 mg (has no administration in time range)  hydrochlorothiazide (HYDRODIURIL) tablet 25 mg (has no administration in time range)  losartan (COZAAR) tablet 50 mg (has no administration in time range)  ARIPiprazole (ABILIFY) tablet 10 mg (has no administration in time range)  donepezil (ARICEPT) tablet 10 mg (has no administration in time range)  DULoxetine (CYMBALTA) DR capsule 20 mg (has no administration in time range)  traZODone (DESYREL) tablet 100 mg (has no administration in time range)  zolpidem (AMBIEN) tablet 10 mg (has no administration in time range)  calcium carbonate (TUMS - dosed in mg elemental calcium) chewable tablet 200 mg of elemental calcium (has no administration in time range)   Pancrelipase (Lip-Prot-Amyl) 40000-126000 units CPEP 1 capsule (has no administration in time range)  mirabegron ER (MYRBETRIQ) tablet 50 mg (has no administration in time range)  tamsulosin (FLOMAX) capsule 0.4 mg (has no administration in time range)  Iron TABS (has no administration in time range)  prasugrel (EFFIENT) tablet 5 mg (has no administration in time range)  gabapentin (NEURONTIN) tablet  800 mg (has no administration in time range)  methocarbamol (ROBAXIN) tablet 500 mg (has no administration in time range)  cholecalciferol (VITAMIN D) tablet 1,000 Units (has no administration in time range)  omega-3 acid ethyl esters (LOVAZA) capsule 2 g (has no administration in time range)  albuterol (VENTOLIN HFA) 108 (90 Base) MCG/ACT inhaler 2 puff (has no administration in time range)  Azelastine-Fluticasone 137-50 MCG/ACT SUSP 1 spray (has no administration in time range)  loratadine (CLARITIN) tablet 10 mg (has no administration in time range)  enoxaparin (LOVENOX) injection 40 mg (has no administration in time range)  0.9 %  sodium chloride infusion (has no administration in time range)  ceFEPIme (MAXIPIME) 2 g in sodium chloride 0.9 % 100 mL IVPB (has no administration in time range)  metroNIDAZOLE (FLAGYL) IVPB 500 mg (has no administration in time range)  vancomycin (VANCOCIN) IVPB 1000 mg/200 mL premix (has no administration in time range)  acetaminophen (TYLENOL) tablet 650 mg (has no administration in time range)    Or  acetaminophen (TYLENOL) suppository 650 mg (has no administration in time range)  traZODone (DESYREL) tablet 25 mg (has no administration in time range)  magnesium hydroxide (MILK OF MAGNESIA) suspension 30 mL (has no administration in time range)  ondansetron (ZOFRAN) tablet 4 mg (has no administration in time range)    Or  ondansetron (ZOFRAN) injection 4 mg (has no administration in time range)  acetaminophen (TYLENOL) tablet 650 mg (650 mg Oral Given 11/25/21  1702)  sodium chloride 0.9 % bolus 1,000 mL (0 mLs Intravenous Stopped 11/25/21 1950)  ondansetron (ZOFRAN) injection 4 mg (4 mg Intravenous Given 11/25/21 1948)  iohexol (OMNIPAQUE) 300 MG/ML solution 75 mL (75 mLs Intravenous Contrast Given 11/25/21 1905)  acetaminophen (TYLENOL) tablet 650 mg (650 mg Oral Given 11/25/21 2045)  morphine (PF) 4 MG/ML injection 4 mg (4 mg Intravenous Given 11/25/21 2045)    ED Course/ Medical Decision Making/ A&P Clinical Course as of 11/25/21 2233  Fri Nov 25, 2021  1553 Stopped by multiple times the patient there.  Nurse states he is being cleaned from urinating on himself. [CR]  2957 71 year old male from home.  Chronic back issues here with altered mental status and fever.  Incontinence of urine and stool unclear how long.  Not really following commands very well.  Getting some screening labs and imaging cultures.  Will need admission to the hospital. [MB]  1944 Consulted Dr. Redmond Baseman ENT regarding the patient, and he does not think that findings of abnormal CT maxillofacial were the cause of the fever.  Patient has been on Augmentin since surgery. [CR]  2209 Dr. Sidney Ace of hospital medicine was consulted regarding the patient.  He agreed with admission as well as assuming further treatment/care of patient. [CR]    Clinical Course User Index [CR] Wilnette Kales, PA [MB] Hayden Rasmussen, MD                           Medical Decision Making Amount and/or Complexity of Data Reviewed Labs: ordered. Radiology: ordered.  Risk OTC drugs. Prescription drug management. Decision regarding hospitalization.   This patient presents to the ED for concern of back pain, this involves an extensive number of treatment options, and is a complaint that carries with it a high risk of complications and morbidity.  The differential diagnosis includes sepsis, cauda equina, spinal epidural abscess, mental status, bacteremia, sinus abscess   Co morbidities that complicate the  patient evaluation  Chronic back pain, chronic bowel/bladder dysfunction, diabetes mellitus, hypertension, hyperlipidemia, myocardial infarction, right BKA   Lab Tests:  I Ordered, and personally interpreted labs.  The pertinent results include: No acute abnormalities on blood test ordered.  UA, blood cultures, urine culture pending upon admission.   Imaging Studies ordered:  I ordered imaging studies including CT head, CT maxillofacial, chest x-ray, MRI lumbar spine I independently visualized and interpreted imaging which showed  CT head: No evidence of acute infarction, hemorrhage, hydrocephalus, extra-axial collection or mass lesion affected.  No hyperdense vessel.  No acute fracture.  No mastoid effusion. CT maxillofacial: No evidence of intracranial abnormality.  Severe sinus.  Nasal sinus disease as described above.  No evidence for acute facial fracture. chest x-ray: No acute abnormality MRI lumbar spine: Pending upon shift change. I agree with the radiologist interpretation   Cardiac Monitoring: / EKG:  The patient was maintained on a cardiac monitor.  I personally viewed and interpreted the cardiac monitored which showed an underlying rhythm of: Sinus rhythm   Consultations Obtained:  I requested consultation with the [facility]. [facility] medicine,  and discussed lab and imaging findings as well as pertinent plan - they recommend: Admission of patient.   Problem List / ED Course / Critical interventions / Medication management  Document status/sepsis/FUO I ordered medication including broad-spectrum antibiotic coverage   Reevaluation of the patient after these medicines showed that the patient improved I have reviewed the patients home medicines and have made adjustments as needed   Social Determinants of Health:  Lives by himself.  Denies illicit drug use.   Test / Admission - Considered:  FUO/back pain Vitals signs significant for hypertension with blood  pressure 144/61, febrile with initial temp of 102.1 which partially responded to oral administration of Tylenol.. Otherwise within normal range and stable throughout visit. Laboratory/imaging studies significant for: No acute abnormality besides CT maxillofacial.  ENT surgeon consulted regarding findings and described them as normal postop changes.  MR pending what should change as well as UA.  Results should dictate care.  In the meantime, broad-spectrum antibiotics began.  Treatment plan was discussed at length with patient, and he noted nausea understanding was agreeable to said plan.  Hospital medicine was consulted regarding the patient and they agreed with assuming further treatment/care of patient.  Patient was stable upon admission.         Final Clinical Impression(s) / ED Diagnoses Final diagnoses:  Acute midline low back pain, unspecified whether sciatica present  Fever, unspecified fever cause    Rx / DC Orders ED Discharge Orders     None         Wilnette Kales, [place] 11/25/21 2234    Pattricia Boss, MD 11/26/21 1330

## 2021-11-26 ENCOUNTER — Inpatient Hospital Stay (HOSPITAL_COMMUNITY): Payer: Medicare Other

## 2021-11-26 DIAGNOSIS — A419 Sepsis, unspecified organism: Secondary | ICD-10-CM | POA: Diagnosis not present

## 2021-11-26 DIAGNOSIS — K8689 Other specified diseases of pancreas: Secondary | ICD-10-CM

## 2021-11-26 DIAGNOSIS — G894 Chronic pain syndrome: Secondary | ICD-10-CM

## 2021-11-26 DIAGNOSIS — N401 Enlarged prostate with lower urinary tract symptoms: Secondary | ICD-10-CM | POA: Diagnosis not present

## 2021-11-26 DIAGNOSIS — K5100 Ulcerative (chronic) pancolitis without complications: Secondary | ICD-10-CM

## 2021-11-26 DIAGNOSIS — E1165 Type 2 diabetes mellitus with hyperglycemia: Secondary | ICD-10-CM

## 2021-11-26 DIAGNOSIS — Z9989 Dependence on other enabling machines and devices: Secondary | ICD-10-CM

## 2021-11-26 DIAGNOSIS — N400 Enlarged prostate without lower urinary tract symptoms: Secondary | ICD-10-CM

## 2021-11-26 DIAGNOSIS — I2510 Atherosclerotic heart disease of native coronary artery without angina pectoris: Secondary | ICD-10-CM

## 2021-11-26 DIAGNOSIS — R350 Frequency of micturition: Secondary | ICD-10-CM

## 2021-11-26 DIAGNOSIS — Z89511 Acquired absence of right leg below knee: Secondary | ICD-10-CM

## 2021-11-26 DIAGNOSIS — E876 Hypokalemia: Secondary | ICD-10-CM

## 2021-11-26 DIAGNOSIS — G4733 Obstructive sleep apnea (adult) (pediatric): Secondary | ICD-10-CM

## 2021-11-26 DIAGNOSIS — Z6841 Body Mass Index (BMI) 40.0 and over, adult: Secondary | ICD-10-CM

## 2021-11-26 LAB — URINALYSIS, ROUTINE W REFLEX MICROSCOPIC
Bacteria, UA: NONE SEEN
Bilirubin Urine: NEGATIVE
Glucose, UA: NEGATIVE mg/dL
Ketones, ur: 5 mg/dL — AB
Leukocytes,Ua: NEGATIVE
Nitrite: NEGATIVE
Protein, ur: 30 mg/dL — AB
Specific Gravity, Urine: 1.038 — ABNORMAL HIGH (ref 1.005–1.030)
pH: 5 (ref 5.0–8.0)

## 2021-11-26 LAB — C DIFFICILE QUICK SCREEN W PCR REFLEX
C Diff antigen: NEGATIVE
C Diff interpretation: NOT DETECTED
C Diff toxin: NEGATIVE

## 2021-11-26 LAB — CBC
HCT: 39.5 % (ref 39.0–52.0)
Hemoglobin: 13.2 g/dL (ref 13.0–17.0)
MCH: 31.1 pg (ref 26.0–34.0)
MCHC: 33.4 g/dL (ref 30.0–36.0)
MCV: 93.2 fL (ref 80.0–100.0)
Platelets: 163 10*3/uL (ref 150–400)
RBC: 4.24 MIL/uL (ref 4.22–5.81)
RDW: 13.7 % (ref 11.5–15.5)
WBC: 7.2 10*3/uL (ref 4.0–10.5)
nRBC: 0 % (ref 0.0–0.2)

## 2021-11-26 LAB — BASIC METABOLIC PANEL
Anion gap: 11 (ref 5–15)
BUN: 21 mg/dL (ref 8–23)
CO2: 20 mmol/L — ABNORMAL LOW (ref 22–32)
Calcium: 7.8 mg/dL — ABNORMAL LOW (ref 8.9–10.3)
Chloride: 105 mmol/L (ref 98–111)
Creatinine, Ser: 0.97 mg/dL (ref 0.61–1.24)
GFR, Estimated: >60 mL/min (ref >60)
Glucose, Bld: 182 mg/dL — ABNORMAL HIGH (ref 70–99)
Potassium: 3.2 mmol/L — ABNORMAL LOW (ref 3.5–5.1)
Sodium: 136 mmol/L (ref 135–145)

## 2021-11-26 LAB — PROCALCITONIN: Procalcitonin: 5.78 ng/mL

## 2021-11-26 LAB — MAGNESIUM: Magnesium: 1.8 mg/dL (ref 1.7–2.4)

## 2021-11-26 LAB — CORTISOL-AM, BLOOD: Cortisol - AM: 35.1 ug/dL — ABNORMAL HIGH (ref 6.7–22.6)

## 2021-11-26 LAB — CBG MONITORING, ED: Glucose-Capillary: 118 mg/dL — ABNORMAL HIGH (ref 70–99)

## 2021-11-26 LAB — PROTIME-INR
INR: 1.4 — ABNORMAL HIGH (ref 0.8–1.2)
Prothrombin Time: 16.9 seconds — ABNORMAL HIGH (ref 11.4–15.2)

## 2021-11-26 LAB — GLUCOSE, CAPILLARY
Glucose-Capillary: 139 mg/dL — ABNORMAL HIGH (ref 70–99)
Glucose-Capillary: 107 mg/dL — ABNORMAL HIGH (ref 70–99)

## 2021-11-26 MED ORDER — SODIUM CHLORIDE 0.9 % IV SOLN
3.0000 g | Freq: Four times a day (QID) | INTRAVENOUS | Status: DC
  Administered 2021-11-26 – 2021-11-27 (×5): 3 g via INTRAVENOUS
  Filled 2021-11-26 (×6): qty 8

## 2021-11-26 MED ORDER — PANCRELIPASE (LIP-PROT-AMYL) 12000-38000 UNITS PO CPEP
36000.0000 [IU] | ORAL_CAPSULE | ORAL | Status: DC
  Administered 2021-11-26: 36000 [IU] via ORAL
  Filled 2021-11-26 (×3): qty 1

## 2021-11-26 MED ORDER — PANCRELIPASE (LIP-PROT-AMYL) 12000-38000 UNITS PO CPEP
72000.0000 [IU] | ORAL_CAPSULE | Freq: Three times a day (TID) | ORAL | Status: DC
  Administered 2021-11-26 – 2021-11-29 (×8): 72000 [IU] via ORAL
  Filled 2021-11-26: qty 6
  Filled 2021-11-26: qty 2
  Filled 2021-11-26 (×7): qty 6
  Filled 2021-11-26 (×2): qty 2
  Filled 2021-11-26: qty 6

## 2021-11-26 MED ORDER — ALBUTEROL SULFATE (2.5 MG/3ML) 0.083% IN NEBU
2.5000 mg | INHALATION_SOLUTION | Freq: Four times a day (QID) | RESPIRATORY_TRACT | Status: DC | PRN
  Administered 2021-11-27: 2.5 mg via RESPIRATORY_TRACT
  Filled 2021-11-26: qty 3

## 2021-11-26 MED ORDER — LOPERAMIDE HCL 2 MG PO CAPS
2.0000 mg | ORAL_CAPSULE | ORAL | Status: DC | PRN
  Administered 2021-11-27 – 2021-11-28 (×2): 2 mg via ORAL
  Filled 2021-11-26 (×2): qty 1

## 2021-11-26 MED ORDER — POTASSIUM CHLORIDE CRYS ER 20 MEQ PO TBCR
40.0000 meq | EXTENDED_RELEASE_TABLET | ORAL | Status: AC
  Administered 2021-11-26 (×2): 40 meq via ORAL
  Filled 2021-11-26 (×2): qty 2

## 2021-11-26 MED ORDER — GADOBUTROL 1 MMOL/ML IV SOLN
10.0000 mL | Freq: Once | INTRAVENOUS | Status: AC | PRN
  Administered 2021-11-26: 10 mL via INTRAVENOUS

## 2021-11-26 MED ORDER — INSULIN ASPART 100 UNIT/ML IJ SOLN
0.0000 [IU] | Freq: Every day | INTRAMUSCULAR | Status: DC
  Filled 2021-11-26: qty 0.05

## 2021-11-26 MED ORDER — SODIUM CHLORIDE (PF) 0.9 % IJ SOLN
INTRAMUSCULAR | Status: AC
  Filled 2021-11-26: qty 50

## 2021-11-26 MED ORDER — INSULIN ASPART 100 UNIT/ML IJ SOLN
0.0000 [IU] | Freq: Three times a day (TID) | INTRAMUSCULAR | Status: DC
  Administered 2021-11-26: 2 [IU] via SUBCUTANEOUS
  Filled 2021-11-26: qty 0.15

## 2021-11-26 MED ORDER — AZITHROMYCIN 250 MG PO TABS
500.0000 mg | ORAL_TABLET | Freq: Every day | ORAL | Status: DC
  Administered 2021-11-26 – 2021-11-27 (×2): 500 mg via ORAL
  Filled 2021-11-26 (×2): qty 2

## 2021-11-26 MED ORDER — IOHEXOL 300 MG/ML  SOLN
100.0000 mL | Freq: Once | INTRAMUSCULAR | Status: AC | PRN
  Administered 2021-11-26: 100 mL via INTRAVENOUS

## 2021-11-26 NOTE — Progress Notes (Signed)
PROGRESS NOTE  Trevon Strothers SJG:[id number] DOB: Jun 24, 1950   PCP: [name], MD  Patient is from: Home.  Lives alone.  Has prosthetics for right BKA  DOA: 11/25/2021 LOS: 1  Chief complaints Chief Complaint  Patient presents with   Back Pain     Brief Narrative / Interim history: 71 year old M with PMH of CAD s/p multiple stents, DM-2, OSA on CPAP, HTN, chronic pain syndrome, right BKA, anxiety, depression, Mobitz type I, pancreatic insufficiency, chronic sinusitis s/p sinus surgery on 7/5 presenting with increased low back pain and diarrhea for about 2 days.  He was referred by his primary care physician to neurosurgery and is awaiting on insurance to clear him for surgery "SI fusion".  In ED, febrile to 102.1.  Other vitals within normal.  CBC, CMP and CXR without significant finding.  Head and maxillofacial CT showed severe pansinus paranasal sinus disease.  Lumbar MRI markedly limited study negative for cauda equina impingement or high-grade neural foraminal stenosis.  UA, urine culture, blood culture and CT abdomen and pelvis ordered.  Patient was started on broad-spectrum antibiotics and IV fluid and admitted for sepsis.  The next day, CT abdomen and pelvis concerning for pancolitis, cholelithiasis and sludge.  UA without significant finding.   Subjective: Seen and examined earlier this morning.  No major events overnight of this morning.  He reports watery bowel movements and bilateral low back pain mainly on the side.  He states he is in the process of getting "SI fusion".  Some nasal congestion but not worse.  He denies nosebleed.  Objective: Vitals:   11/26/21 0700 11/26/21 1000 11/26/21 1030 11/26/21 1045  BP: 121/69 (!) 141/60 122/76 124/62  Pulse: 74 76 62 67  Resp: 20 12 14 12   Temp:      TempSrc:      SpO2: 91% 93% 96% 97%    Examination:  GENERAL: No apparent distress.  Nontoxic. HEENT: MMM.  Vision and hearing grossly intact.  NECK: Supple.  No  apparent JVD.  RESP:  No IWOB.  Fair aeration bilaterally. CVS:  RRR. Heart sounds normal.  ABD/GI/GU: BS+. Abd soft.  Mild tenderness to palpation across lower abdomen. MSK/EXT:  Moves extremities.  Right BKA.  Deformity. No edema.  SKIN: no apparent skin lesion or wound NEURO: Awake, alert and oriented appropriately.  No apparent focal neuro deficit. PSYCH: Calm. Normal affect.   Procedures:  None  Microbiology summarized: Blood cultures NGTD. Urine culture pending. C. difficile and GIP pending.  Assessment and plan: Principal Problem:   Sepsis ([place]) Active Problems:   Low back pain   Hyperlipidemia   Chronic pain syndrome   Essential hypertension   Diabetes mellitus type 2 in obese (HCC)   Obesity   OSA on CPAP   Controlled diabetes mellitus with hyperglycemia (HCC)   Major depression   S/P unilateral BKA (below knee amputation), right (HCC)   Coronary artery disease   BPH (benign prostatic hyperplasia)   Pancolitis ([place])   Pancreatic insufficiency  Sepsis due to pancolitis: POA.  Febrile and tachycardic.  He has diarrhea.  CT shows pancolitis.  Started on Augmentin 2 days prior to presentation but denies prior antibiotic use.  Abdominal exam benign.  Blood cultures NGTD.  Pro-Cal elevated to 5.7.  UA not concerning for UTI.  No cardiopulmonary symptoms. -De-escalate antibiotic to IV Unasyn which would cover colitis and sinusitis. -Check C. difficile and GIP -Increase IV fluid -Start carb modified/cardiology diet.   Acute on  chronic low back pain-no acute finding on lumbar MRI.  Patient states plan for "SI fusion" outpatient.  He has history of chronic pain syndrome -Multimodal pain control  Chronic sinusitis s/p surgery by ENT.  Was on Augmentin prior to admission. -IV Unasyn as above   BPH with urinary incontinence -Continue home Flomax -He is also on Myrbetriq likely for overactive bladder   History of CAD s/p multiple stents; no cardiopulmonary  symptoms -Continue Effient, low-dose aspirin, Cozaar and statin therapy.   Major depression -continue Abilify and Cymbalta.   Controlled NIDDM-2 with hyperglycemia and neuropathy: Seems to be on Ozempic at home.  A1c 5.5% in 05/2021 Recent Labs  Lab 11/23/21 0811 11/23/21 1231 11/23/21 1636 11/23/21 2007  GLUCAP 114* 154* 230* 228*  -Start SSI-moderate -Check hemoglobin A1c -Continue statin and gabapentin   Essential hypertension: Normotensive. - We will continue his antihypertensives   Hyperlipidemia -Continue statin.  Right BKA: Uses prosthesis -PT/OT eval  Hypokalemia: Likely due to HCTZ. -Discontinue HCTZ -Monitor and replenish -Check magnesium  Pancreatic insufficiency -Continue Creon.  There is no height or weight on file to calculate BMI.           DVT prophylaxis:  Subcu Lovenox.  Code Status: Partial code Family Communication: None at bedside Level of care: Telemetry Status is: Inpatient Remains inpatient appropriate because: Sepsis due to pancolitis   Final disposition: TBD Consultants:  None  Sch Meds:  Scheduled Meds:  ARIPiprazole  10 mg Oral Daily   aspirin EC  81 mg Oral QHS   atorvastatin  20 mg Oral Daily   Buprenorphine HCl  600 mcg Buccal BID   calcium carbonate  1 tablet Oral TID WC   celecoxib  200 mg Oral BID   cholecalciferol  1,000 Units Oral QHS   donepezil  10 mg Oral QHS   DULoxetine  20 mg Oral BID   enoxaparin (LOVENOX) injection  55 mg Subcutaneous Q24H   ferrous sulfate   Oral Daily   gabapentin  800 mg Oral TID   insulin aspart  0-15 Units Subcutaneous TID WC   insulin aspart  0-5 Units Subcutaneous QHS   lipase/protease/amylase  36,000 Units Oral With snacks   lipase/protease/amylase  72,000 Units Oral TID AC   loratadine  10 mg Oral Daily   losartan  50 mg Oral Daily   mirabegron ER  50 mg Oral QHS   omega-3 acid ethyl esters  2 g Oral Daily   potassium chloride  40 mEq Oral Q4H   prasugrel  5 mg Oral  Daily   sodium chloride (PF)       tamsulosin  0.4 mg Oral BID   traZODone  100 mg Oral QHS   Continuous Infusions:  sodium chloride 100 mL/hr at 11/26/21 0042   ampicillin-sulbactam (UNASYN) IV     PRN Meds:.acetaminophen **OR** acetaminophen, albuterol, HYDROcodone-acetaminophen, magnesium hydroxide, methocarbamol, ondansetron **OR** ondansetron (ZOFRAN) IV, sodium chloride (PF), traZODone, zolpidem  Antimicrobials: Anti-infectives (From admission, onward)    Start     Dose/Rate Route Frequency Ordered Stop   11/26/21 1200  vancomycin (VANCOCIN) IVPB 1000 mg/200 mL premix  Status:  Discontinued        1,000 mg 200 mL/hr over 60 Minutes Intravenous Every 12 hours 11/25/21 2322 11/26/21 1046   11/26/21 1100  Ampicillin-Sulbactam (UNASYN) 3 g in sodium chloride 0.9 % 100 mL IVPB        3 g 200 mL/hr over 30 Minutes Intravenous Every 6 hours 11/26/21 1046  11/26/21 1000  metroNIDAZOLE (FLAGYL) IVPB 500 mg  Status:  Discontinued        500 mg 100 mL/hr over 60 Minutes Intravenous Every 12 hours 11/25/21 2231 11/26/21 1046   11/26/21 0600  ceFEPIme (MAXIPIME) 2 g in sodium chloride 0.9 % 100 mL IVPB  Status:  Discontinued        2 g 200 mL/hr over 30 Minutes Intravenous Every 8 hours 11/25/21 2318 11/26/21 1046   11/25/21 2330  vancomycin (VANCOREADY) IVPB 2000 mg/400 mL        2,000 mg 200 mL/hr over 120 Minutes Intravenous  Once 11/25/21 2317 11/26/21 0242   11/25/21 2245  ceFEPIme (MAXIPIME) 2 g in sodium chloride 0.9 % 100 mL IVPB  Status:  Discontinued        2 g 200 mL/hr over 30 Minutes Intravenous  Once 11/25/21 2231 11/25/21 2315   11/25/21 2245  vancomycin (VANCOCIN) IVPB 1000 mg/200 mL premix  Status:  Discontinued        1,000 mg 200 mL/hr over 60 Minutes Intravenous  Once 11/25/21 2231 11/25/21 2316   11/25/21 2215  ceFEPIme (MAXIPIME) 2 g in sodium chloride 0.9 % 100 mL IVPB        2 g 200 mL/hr over 30 Minutes Intravenous  Once 11/25/21 2200 11/25/21 2240    11/25/21 2215  metroNIDAZOLE (FLAGYL) IVPB 500 mg        500 mg 100 mL/hr over 60 Minutes Intravenous  Once 11/25/21 2200 11/25/21 2340   11/25/21 2215  vancomycin (VANCOCIN) IVPB 1000 mg/200 mL premix  Status:  Discontinued        1,000 mg 200 mL/hr over 60 Minutes Intravenous  Once 11/25/21 2200 11/25/21 2316        I have personally reviewed the following labs and images: CBC: Recent Labs  Lab 11/25/21 1717 11/26/21 0344  WBC 7.1 7.2  NEUTROABS 5.9  --   HGB 13.9 13.2  HCT 41.4 39.5  MCV 92.6 93.2  PLT 164 163   BMP &GFR Recent Labs  Lab 11/25/21 1717 11/26/21 0344  NA 137 136  K 3.6 3.2*  CL 102 105  CO2 24 20*  GLUCOSE 147* 182*  BUN 20 21  CREATININE 0.93 0.97  CALCIUM 8.4* 7.8*   Estimated Creatinine Clearance: 94.5 mL/min (by C-G formula based on SCr of 0.97 mg/dL). Liver & Pancreas: Recent Labs  Lab 11/25/21 1717  AST 21  ALT 12  ALKPHOS 59  BILITOT 0.9  PROT 6.7  ALBUMIN 3.7   No results for input(s): "LIPASE", "AMYLASE" in the last 168 hours. No results for input(s): "AMMONIA" in the last 168 hours. Diabetic: No results for input(s): "HGBA1C" in the last 72 hours. Recent Labs  Lab 11/23/21 0811 11/23/21 1231 11/23/21 1636 11/23/21 2007  GLUCAP 114* 154* 230* 228*   Cardiac Enzymes: No results for input(s): "CKTOTAL", "CKMB", "CKMBINDEX", "TROPONINI" in the last 168 hours. No results for input(s): "PROBNP" in the last 8760 hours. Coagulation Profile: Recent Labs  Lab 11/26/21 0534  INR 1.4*   Thyroid Function Tests: No results for input(s): "TSH", "T4TOTAL", "FREET4", "T3FREE", "THYROIDAB" in the last 72 hours. Lipid Profile: No results for input(s): "CHOL", "HDL", "LDLCALC", "TRIG", "CHOLHDL", "LDLDIRECT" in the last 72 hours. Anemia Panel: No results for input(s): "VITAMINB12", "FOLATE", "FERRITIN", "TIBC", "IRON", "RETICCTPCT" in the last 72 hours. Urine analysis:    Component Value Date/Time   COLORURINE YELLOW 11/26/2021  [place] 11/26/2021 0344   LABSPEC  1.038 (H) 11/26/2021 0344   PHURINE 5.0 11/26/2021 0344   GLUCOSEU NEGATIVE 11/26/2021 0344   GLUCOSEU NEGATIVE 06/14/2021 0959   HGBUR SMALL (A) 11/26/2021 0344   HGBUR negative 04/19/2010 1014   BILIRUBINUR NEGATIVE 11/26/2021 0344   KETONESUR 5 (A) 11/26/2021 0344   PROTEINUR 30 (A) 11/26/2021 0344   UROBILINOGEN 0.2 06/14/2021 0959   NITRITE NEGATIVE 11/26/2021 0344   LEUKOCYTESUR NEGATIVE 11/26/2021 0344   Sepsis Labs: Invalid input(s): "PROCALCITONIN", "LACTICIDVEN"  Microbiology: Recent Results (from the past 240 hour(s))  Blood culture (routine x 2)     Status: None (Preliminary result)   Collection Time: 11/25/21  7:27 PM   Specimen: BLOOD  Result Value Ref Range Status   Specimen Description   Final    BLOOD SITE NOT SPECIFIED Performed at [facility] [address] [place], [place] [address]    Special Requests   Final    BOTTLES DRAWN AEROBIC AND ANAEROBIC Blood Culture adequate volume Performed at [facility] [address] [place], [place] [address]    Culture   Final    NO GROWTH < 12 HOURS Performed at [facility] [address] [place], [place] [address]    Report Status PENDING  Incomplete    Radiology Studies: CT ABDOMEN PELVIS W WO CONTRAST  Result Date: 11/26/2021 CLINICAL DATA:  Left lower quadrant pain EXAM: CT ABDOMEN AND PELVIS WITHOUT AND WITH CONTRAST TECHNIQUE: Multidetector CT imaging of the abdomen and pelvis was performed following the standard protocol before and following the bolus administration of intravenous contrast. RADIATION DOSE REDUCTION: This exam was performed according to the departmental dose-optimization program which includes automated exposure control, adjustment of the mA and/or kV according to patient size and/or use of iterative reconstruction technique. CONTRAST:  130m OMNIPAQUE IOHEXOL 300 MG/ML  SOLN COMPARISON:   02/10/2019 FINDINGS: Lower chest: No acute abnormality. Hepatobiliary: There is no focal liver abnormality. Tiny stones/sludge noted layering within the gallbladder. No bile duct dilatation. Pancreas: Unremarkable. No pancreatic ductal dilatation or surrounding inflammatory changes. Spleen: Normal in size without focal abnormality. Adrenals/Urinary Tract: Normal adrenal glands. No suspicious kidney mass, nephrolithiasis or hydronephrosis. Urinary bladder is partially obscured by streak artifact from bilateral hip arthroplasty devices. No focal abnormality noted within this limitation. Stomach/Bowel: Small hiatal hernia. Postop change from sleeve gastrectomy. No pathologic dilatation of large or small bowel loops. The appendix is not confidently identified. There is marked diffuse colonic wall thickening and inflammation identified involving the cecum up to the rectum compatible with pancolitis. No definite signs pneumatosis. No evidence for bowel perforation or bowel obstruction.Liquid stool is identified within the left colon, sigmoid colon and rectum. Vascular/Lymphatic: Aortic atherosclerosis without aneurysm. The upper abdominal vascularity appears patent. No signs of abdominopelvic adenopathy. Reproductive: Prostate gland is obscured by streak artifact from patient's hip arthroplasty devices. Other: Trace free fluid noted along the right pericolic gutter. No focal fluid collections. No signs of pneumoperitoneum Musculoskeletal: Status post bilateral hip arthroplasty. Previous lumbar laminectomy and posterior hardware fixation. No acute or suspicious osseous findings. IMPRESSION: 1. Marked diffuse colonic wall thickening and inflammation involving the cecum up to the rectum compatible with pancolitis. No evidence for bowel perforation or bowel obstruction. 2. Small hiatal hernia. 3. Gallstones/sludge. 4. Aortic Atherosclerosis (ICD10-I70.0). Electronically Signed   By: TKerby MoorsM.D.   On: 11/26/2021 08:00    MR Lumbar Spine W Wo Contrast  Result Date: 11/26/2021 CLINICAL DATA:  Low back pain EXAM: MRI LUMBAR SPINE  WITHOUT AND WITH CONTRAST TECHNIQUE: Multiplanar and multiecho pulse sequences of the lumbar spine were obtained without and with intravenous contrast. CONTRAST:  57m GADAVIST GADOBUTROL 1 MMOL/ML IV SOLN COMPARISON:  02/19/2020 FINDINGS: The examination is degraded by susceptibility effects caused by spinal hardware. Segmentation: Standard. Alignment:  Grade 1 retrolisthesis at L1-2 and L2-3 Vertebrae: L1-S1 posterior instrumented fusion. No acute abnormality. Conus medullaris and cauda equina: Conus extends to the L1 level. Conus and cauda equina appear normal. Paraspinal and other soft tissues: Negative Disc levels: Limited assessment of disc levels due to susceptibility effects. Within that limitation, the spinal canal is widely patent and there is no evidence of cauda equina impingement. There is no high-grade neural foraminal stenosis. IMPRESSION: 1. Markedly limited study due to susceptibility effects caused by spinal hardware. Within that limitation, there is no evidence of cauda equina impingement or high-grade neural foraminal stenosis. 2. Grade 1 retrolisthesis at L1-2 and L2-3. Electronically Signed   By: KUlyses JarredM.D.   On: 11/26/2021 01:36   CT Head Wo Contrast  Result Date: 11/25/2021 CLINICAL DATA:  Mental status change, unknown cause; Mastication paralysis/weakness (CN 5) EXAM: CT HEAD WITHOUT CONTRAST CT MAXILLOFACIAL WITHOUT CONTRAST TECHNIQUE: Multidetector CT imaging of the head and maxillofacial structures were performed using the standard protocol without intravenous contrast. Multiplanar CT image reconstructions of the maxillofacial structures were also generated. RADIATION DOSE REDUCTION: This exam was performed according to the departmental dose-optimization program which includes automated exposure control, adjustment of the mA and/or kV according to patient size and/or  use of iterative reconstruction technique. COMPARISON:  None Available. FINDINGS: CT HEAD FINDINGS Brain: No evidence of acute infarction, hemorrhage, hydrocephalus, extra-axial collection or mass lesion/mass effect. Vascular: No hyperdense vessel identified. Skull: No acute fracture. Other: No mastoid effusions. CT MAXILLOFACIAL FINDINGS Motion limited. Osseous: No fracture or mandibular dislocation. No destructive process. Orbits: Negative. No traumatic or inflammatory finding. Sinuses: Severe paranasal sinus disease including and sinus mucosal thickening with air-fluid levels and areas of internal hyperdensity that could represent inspissated secretions and/or fungal colonization. The nasal cavity is partially opacified. Soft tissues: Motion limited without obvious abnormality. IMPRESSION: 1. No evidence of acute intracranial abnormality. 2. Severe, pansinus paranasal sinus disease, described above. 3. No evidence of acute facial fracture on the motion limited maxillofacial CT. Electronically Signed   By: FMargaretha SheffieldM.D.   On: 11/25/2021 19:28   CT Maxillofacial W Contrast  Result Date: 11/25/2021 CLINICAL DATA:  Mental status change, unknown cause; Mastication paralysis/weakness (CN 5) EXAM: CT HEAD WITHOUT CONTRAST CT MAXILLOFACIAL WITHOUT CONTRAST TECHNIQUE: Multidetector CT imaging of the head and maxillofacial structures were performed using the standard protocol without intravenous contrast. Multiplanar CT image reconstructions of the maxillofacial structures were also generated. RADIATION DOSE REDUCTION: This exam was performed according to the departmental dose-optimization program which includes automated exposure control, adjustment of the mA and/or kV according to patient size and/or use of iterative reconstruction technique. COMPARISON:  None Available. FINDINGS: CT HEAD FINDINGS Brain: No evidence of acute infarction, hemorrhage, hydrocephalus, extra-axial collection or mass lesion/mass  effect. Vascular: No hyperdense vessel identified. Skull: No acute fracture. Other: No mastoid effusions. CT MAXILLOFACIAL FINDINGS Motion limited. Osseous: No fracture or mandibular dislocation. No destructive process. Orbits: Negative. No traumatic or inflammatory finding. Sinuses: Severe paranasal sinus disease including and sinus mucosal thickening with air-fluid levels and areas of internal hyperdensity that could represent inspissated secretions and/or fungal colonization. The nasal cavity is partially opacified. Soft tissues: Motion limited without obvious abnormality. IMPRESSION:  1. No evidence of acute intracranial abnormality. 2. Severe, pansinus paranasal sinus disease, described above. 3. No evidence of acute facial fracture on the motion limited maxillofacial CT. Electronically Signed   By: Margaretha Sheffield M.D.   On: 11/25/2021 19:28   DG Chest Portable 1 View  Result Date: 11/25/2021 CLINICAL DATA:  Increasing back pain, cough EXAM: PORTABLE CHEST 1 VIEW COMPARISON:  12/31/2020 FINDINGS: 2 frontal views of the chest demonstrate an unremarkable cardiac silhouette. No acute airspace disease, effusion, or pneumothorax. Postsurgical changes within the left shoulder and lumbar spine. No acute bony abnormality. IMPRESSION: 1. No acute intrathoracic process. Electronically Signed   By: Randa Ngo M.D.   On: 11/25/2021 18:21      Ruairi Stutsman T. [place]  If 7PM-7AM, please contact night-coverage [email or web address] 11/26/2021, 10:56 AM

## 2021-11-26 NOTE — Assessment & Plan Note (Signed)
-   We will continue statin therapy. 

## 2021-11-26 NOTE — Assessment & Plan Note (Signed)
-   We will continue Abilify and Cymbalta.

## 2021-11-26 NOTE — Assessment & Plan Note (Signed)
-   We will continue his antihypertensives. 

## 2021-11-26 NOTE — Assessment & Plan Note (Addendum)
-   The patient will be admitted to a telemetry bed. - We will follow urinalysis results as well as blood cultures and urine culture as needed. - We will continue him on broad-spectrum antibiotic therapy with IV cefepime, vancomycin and Flagyl for now. - The patient will be hydrated with IV normal saline. - Given left lower quadrant tenderness, we will obtain abdominal and pelvic CT scan to rule out diverticulitis or other intra-abdominal etiology.

## 2021-11-26 NOTE — Assessment & Plan Note (Signed)
-   We will continue Flomax 

## 2021-11-26 NOTE — Assessment & Plan Note (Signed)
-   The patient will be placed on supplement coverage with NovoLog. - We will continue basal coverage

## 2021-11-26 NOTE — Assessment & Plan Note (Signed)
-   We will continue Effient, Cozaar and statin therapy.

## 2021-11-26 NOTE — Assessment & Plan Note (Addendum)
-   No acute findings on his lumbar spine MRI. - Pain management will be provided. - PT consult will be obtained. - We will continue Neurontin for associated peripheral neuropathy.

## 2021-11-27 DIAGNOSIS — E1165 Type 2 diabetes mellitus with hyperglycemia: Secondary | ICD-10-CM | POA: Diagnosis not present

## 2021-11-27 DIAGNOSIS — G894 Chronic pain syndrome: Secondary | ICD-10-CM | POA: Diagnosis not present

## 2021-11-27 DIAGNOSIS — R112 Nausea with vomiting, unspecified: Secondary | ICD-10-CM

## 2021-11-27 DIAGNOSIS — N401 Enlarged prostate with lower urinary tract symptoms: Secondary | ICD-10-CM | POA: Diagnosis not present

## 2021-11-27 DIAGNOSIS — A419 Sepsis, unspecified organism: Secondary | ICD-10-CM | POA: Diagnosis not present

## 2021-11-27 LAB — CBC
HCT: 33.2 % — ABNORMAL LOW (ref 39.0–52.0)
Hemoglobin: 11.2 g/dL — ABNORMAL LOW (ref 13.0–17.0)
MCH: 31.4 pg (ref 26.0–34.0)
MCHC: 33.7 g/dL (ref 30.0–36.0)
MCV: 93 fL (ref 80.0–100.0)
Platelets: 165 10*3/uL (ref 150–400)
RBC: 3.57 MIL/uL — ABNORMAL LOW (ref 4.22–5.81)
RDW: 13.4 % (ref 11.5–15.5)
WBC: 5.1 10*3/uL (ref 4.0–10.5)
nRBC: 0 % (ref 0.0–0.2)

## 2021-11-27 LAB — GASTROINTESTINAL PANEL BY PCR, STOOL (REPLACES STOOL CULTURE)
Adenovirus F40/41: NOT DETECTED
Astrovirus: NOT DETECTED
Campylobacter species: NOT DETECTED
Cryptosporidium: NOT DETECTED
Cyclospora cayetanensis: NOT DETECTED
Entamoeba histolytica: NOT DETECTED
Enteroaggregative E coli (EAEC): NOT DETECTED
Enteropathogenic E coli (EPEC): NOT DETECTED
Enterotoxigenic E coli (ETEC): NOT DETECTED
Giardia lamblia: NOT DETECTED
Norovirus GI/GII: NOT DETECTED
Plesimonas shigelloides: NOT DETECTED
Rotavirus A: NOT DETECTED
Salmonella species: NOT DETECTED
Sapovirus (I, II, IV, and V): NOT DETECTED
Shiga like toxin producing E coli (STEC): NOT DETECTED
Shigella/Enteroinvasive E coli (EIEC): DETECTED — AB
Vibrio cholerae: NOT DETECTED
Vibrio species: NOT DETECTED
Yersinia enterocolitica: NOT DETECTED

## 2021-11-27 LAB — CK: Total CK: 67 U/L (ref 49–397)

## 2021-11-27 LAB — RENAL FUNCTION PANEL
Albumin: 2.8 g/dL — ABNORMAL LOW (ref 3.5–5.0)
Anion gap: 6 (ref 5–15)
BUN: 20 mg/dL (ref 8–23)
CO2: 26 mmol/L (ref 22–32)
Calcium: 8.1 mg/dL — ABNORMAL LOW (ref 8.9–10.3)
Chloride: 108 mmol/L (ref 98–111)
Creatinine, Ser: 0.84 mg/dL (ref 0.61–1.24)
GFR, Estimated: >60 mL/min (ref >60)
Glucose, Bld: 114 mg/dL — ABNORMAL HIGH (ref 70–99)
Phosphorus: 2 mg/dL — ABNORMAL LOW (ref 2.5–4.6)
Potassium: 3.2 mmol/L — ABNORMAL LOW (ref 3.5–5.1)
Sodium: 140 mmol/L (ref 135–145)

## 2021-11-27 LAB — C-REACTIVE PROTEIN: CRP: 17.9 mg/dL — ABNORMAL HIGH (ref <1.0)

## 2021-11-27 LAB — URINE CULTURE: Culture: NO GROWTH

## 2021-11-27 LAB — GLUCOSE, CAPILLARY
Glucose-Capillary: 118 mg/dL — ABNORMAL HIGH (ref 70–99)
Glucose-Capillary: 135 mg/dL — ABNORMAL HIGH (ref 70–99)
Glucose-Capillary: 119 mg/dL — ABNORMAL HIGH (ref 70–99)

## 2021-11-27 LAB — HEMOGLOBIN A1C
Hgb A1c MFr Bld: 5.1 % (ref 4.8–5.6)
Mean Plasma Glucose: 99.67 mg/dL

## 2021-11-27 LAB — MAGNESIUM: Magnesium: 1.9 mg/dL (ref 1.7–2.4)

## 2021-11-27 LAB — SEDIMENTATION RATE: Sed Rate: 10 mm/hr (ref 0–16)

## 2021-11-27 MED ORDER — LIP MEDEX EX OINT
TOPICAL_OINTMENT | CUTANEOUS | Status: DC | PRN
  Administered 2021-11-27: 75 via TOPICAL
  Filled 2021-11-27: qty 7

## 2021-11-27 MED ORDER — POTASSIUM CHLORIDE IN NACL 40-0.9 MEQ/L-% IV SOLN
INTRAVENOUS | Status: DC
  Filled 2021-11-27 (×6): qty 1000

## 2021-11-27 MED ORDER — POTASSIUM CHLORIDE 10 MEQ/100ML IV SOLN
10.0000 meq | INTRAVENOUS | Status: AC
  Administered 2021-11-27 (×4): 10 meq via INTRAVENOUS
  Filled 2021-11-27 (×3): qty 100

## 2021-11-27 MED ORDER — SALINE SPRAY 0.65 % NA SOLN
1.0000 | NASAL | Status: DC | PRN
  Administered 2021-11-27: 1 via NASAL
  Filled 2021-11-27 (×2): qty 44

## 2021-11-27 MED ORDER — HYDROXYZINE HCL 25 MG PO TABS: 25.0000 mg | ORAL_TABLET | Freq: Four times a day (QID) | ORAL | Status: DC | PRN

## 2021-11-27 MED ORDER — CIPROFLOXACIN HCL 500 MG PO TABS
500.0000 mg | ORAL_TABLET | Freq: Two times a day (BID) | ORAL | Status: DC
  Administered 2021-11-27 – 2021-11-29 (×4): 500 mg via ORAL
  Filled 2021-11-27 (×4): qty 1

## 2021-11-27 MED ORDER — POTASSIUM PHOSPHATES 15 MMOLE/5ML IV SOLN
15.0000 mmol | Freq: Once | INTRAVENOUS | Status: AC
  Administered 2021-11-27: 15 mmol via INTRAVENOUS
  Filled 2021-11-27: qty 5

## 2021-11-27 MED ORDER — ALBUTEROL SULFATE (2.5 MG/3ML) 0.083% IN NEBU
2.5000 mg | INHALATION_SOLUTION | Freq: Two times a day (BID) | RESPIRATORY_TRACT | Status: DC
  Administered 2021-11-27 – 2021-11-28 (×3): 2.5 mg via RESPIRATORY_TRACT
  Filled 2021-11-27 (×4): qty 3

## 2021-11-27 MED ORDER — OXYMETAZOLINE HCL 0.05 % NA SOLN
1.0000 | Freq: Two times a day (BID) | NASAL | Status: DC | PRN
  Administered 2021-11-28: 1 via NASAL
  Filled 2021-11-27 (×2): qty 15

## 2021-11-27 MED ORDER — AZELASTINE HCL 0.1 % NA SOLN
2.0000 | Freq: Two times a day (BID) | NASAL | Status: DC
  Administered 2021-11-27 – 2021-11-29 (×5): 2 via NASAL
  Filled 2021-11-27: qty 30

## 2021-11-27 MED ORDER — POTASSIUM CHLORIDE 10 MEQ/100ML IV SOLN
INTRAVENOUS | Status: AC
  Filled 2021-11-27: qty 100

## 2021-11-27 NOTE — Progress Notes (Signed)
Pt states that he does not want to wear CPAP tonight.  Machine remains at bedside.

## 2021-11-27 NOTE — Plan of Care (Signed)
  Problem: Tissue Perfusion: Goal: Adequacy of tissue perfusion will improve Outcome: Progressing   Problem: Education: Goal: Knowledge of General Education information will improve Description: Including pain rating scale, medication(s)/side effects and non-pharmacologic comfort measures Outcome: Progressing   Problem: Clinical Measurements: Goal: Ability to maintain clinical measurements within normal limits will improve Outcome: Progressing

## 2021-11-27 NOTE — Progress Notes (Signed)
PROGRESS NOTE  Travis [name] KNL:[id number] DOB: February 13, 1951   PCP: [name], MD  Patient is from: Home.  Lives alone.  Has prosthetics for right BKA  DOA: 11/25/2021 LOS: 2  Chief complaints Chief Complaint  Patient presents with   Back Pain     Brief Narrative / Interim history: 71 year old M with PMH of CAD s/p multiple stents, DM-2, OSA on CPAP, HTN, chronic pain syndrome, right BKA, anxiety, depression, Mobitz type I, pancreatic insufficiency, chronic sinusitis s/p sinus surgery on 7/5 presenting with increased low back pain and diarrhea for about 2 days.  He was referred by his primary care physician to neurosurgery and is awaiting on insurance to clear him for surgery "SI fusion".  In ED, febrile to 102.1.  Other vitals within normal.  CBC, CMP and CXR without significant finding.  Head and maxillofacial CT showed severe pansinus paranasal sinus disease.  Lumbar MRI markedly limited study negative for cauda equina impingement or high-grade neural foraminal stenosis.  UA, urine culture, blood culture and CT abdomen and pelvis ordered.  Patient was started on broad-spectrum antibiotics and IV fluid and admitted for sepsis.  The next day, CT abdomen and pelvis concerning for pancolitis, cholelithiasis and sludge.  UA without significant finding.  Blood cultures NGTD.  C. difficile and GIP ordered.  Antibiotics de-escalated to Unasyn and azithromycin.   Subjective: Seen and examined earlier this morning.  Had difficulty sleeping due to nasal congestion.  He also reports nausea, vomiting and diarrhea.  Diarrhea still watery.  Denies blood in vomitus or stool.  Denies chest pain or abdominal pain.  Objective: Vitals:   11/27/21 0019 11/27/21 0229 11/27/21 0238 11/27/21 0630  BP: (!) 107/57 129/62  137/62  Pulse: 78 95  69  Resp: 17 20  18   Temp: 98.6 F (37 C) 98.5 F (36.9 C)  98.2 F (36.8 C)  TempSrc:  Oral  Oral  SpO2: 94% 96% 96% 98%  Weight:      Height:         Examination: GENERAL: No apparent distress.  Nontoxic. HEENT: MMM.  Vision and hearing grossly intact.  Nasal congestion. NECK: Supple.  No apparent JVD.  RESP:  No IWOB.  Fair aeration bilaterally. CVS:  RRR. Heart sounds normal.  ABD/GI/GU: BS+. Abd soft, NTND.  Vomiting during my encounter. MSK/EXT:  Moves extremities.  Right BKA. SKIN: no apparent skin lesion or wound NEURO: Awake and alert. Oriented appropriately.  No apparent focal neuro deficit. PSYCH: Calm. Normal affect.   Procedures:  None  Microbiology summarized: Blood cultures NGTD. Urine culture pending. C. difficile negative. GIP pending.  Assessment and plan: Principal Problem:   Sepsis ([place]) Active Problems:   Low back pain   Hyperlipidemia   Chronic pain syndrome   Essential hypertension   Diabetes mellitus type 2 in obese (HCC)   Nausea and vomiting   Obesity   OSA on CPAP   Controlled diabetes mellitus with hyperglycemia (HCC)   Major depression   S/P unilateral BKA (below knee amputation), right (HCC)   Coronary artery disease   BPH (benign prostatic hyperplasia)   Pancolitis (HCC)   Pancreatic insufficiency   Hypophosphatemia  Sepsis due to pancolitis: POA.  Diarrhea, fever and tachycardia on presentation.  CT shows pancolitis.    Abdominal exam benign.  Blood cultures NGTD.  C. difficile negative.  Pro-Cal elevated to 5.7.  UA not concerning for UTI.  No cardiopulmonary symptoms. -Continue IV Unasyn and azithromycin -Follow GIP -Continue  Imodium as needed -Continue IV fluid with KCl   Acute on chronic low back pain-no acute finding on lumbar MRI.  Patient states plan for "SI fusion" outpatient.  He has history of chronic pain syndrome -Multimodal pain control  Chronic sinusitis s/p surgery by ENT: Reports difficulty breathing due to congestion.  Was on Augmentin prior to admission. -IV Unasyn as above -Start saline nose spray, azelastine and Afrin  Nausea and vomiting: Could be due  to acute illness.  Gastroparesis? -Continue IV fluid and antiemetics   BPH with urinary incontinence -Continue home Flomax -He is also on Myrbetriq likely for overactive bladder   History of CAD s/p multiple stents; no cardiopulmonary symptoms -Continue Effient, low-dose aspirin, Cozaar and statin therapy.   Major depression -continue Abilify and Cymbalta.   Controlled NIDDM-2 with hyperglycemia and neuropathy: Seems to be on Ozempic at home.  A1c 5.5% in 05/2021 Recent Labs  Lab 11/23/21 2007 11/26/21 1251 11/26/21 1638 11/26/21 2140 11/27/21 0804  GLUCAP 228* 118* 139* 107* 118*  -Continue SSI-moderate -Check hemoglobin A1c -Continue statin and gabapentin   Essential hypertension: Normotensive. -Continue home meds except HCTZ   Hyperlipidemia -Continue statin.  Right BKA: Uses prosthesis -PT/OT eval  Hypokalemia/hypophosphatemia: Likely due to HCTZ. -IV KCl 10 mill equivalent x4 -IV fluid with KCl -IV potassium phosphate in the afternoon -Monitor and replenish  Pancreatic insufficiency -Continue Creon.  Body mass index is 34.71 kg/m.           DVT prophylaxis:  Place and maintain sequential compression device Start: 11/27/21 0956Subcu Lovenox.  Code Status: Partial code Family Communication: None at bedside Level of care: Telemetry Status is: Inpatient Remains inpatient appropriate because: Sepsis due to pancolitis   Final disposition: TBD Consultants:  None  Sch Meds:  Scheduled Meds:  albuterol  2.5 mg Nebulization BID   ARIPiprazole  10 mg Oral Daily   aspirin EC  81 mg Oral QHS   atorvastatin  20 mg Oral Daily   azelastine  2 spray Each Nare BID   azithromycin  500 mg Oral Daily   Buprenorphine HCl  600 mcg Buccal BID   calcium carbonate  1 tablet Oral TID WC   celecoxib  200 mg Oral BID   cholecalciferol  1,000 Units Oral QHS   donepezil  10 mg Oral QHS   DULoxetine  20 mg Oral BID   enoxaparin (LOVENOX) injection  55 mg  Subcutaneous Q24H   ferrous sulfate   Oral Daily   gabapentin  800 mg Oral TID   insulin aspart  0-15 Units Subcutaneous TID WC   insulin aspart  0-5 Units Subcutaneous QHS   lipase/protease/amylase  36,000 Units Oral With snacks   lipase/protease/amylase  72,000 Units Oral TID AC   loratadine  10 mg Oral Daily   losartan  50 mg Oral Daily   mirabegron ER  50 mg Oral QHS   omega-3 acid ethyl esters  2 g Oral Daily   prasugrel  5 mg Oral Daily   tamsulosin  0.4 mg Oral BID   traZODone  100 mg Oral QHS   Continuous Infusions:  0.9 % NaCl with KCl 40 mEq / L 125 mL/hr at 11/27/21 1025   ampicillin-sulbactam (UNASYN) IV 3 g (11/27/21 0615)   potassium chloride 10 mEq (11/27/21 0945)   potassium PHOSPHATE IVPB (in mmol)     PRN Meds:.acetaminophen **OR** acetaminophen, albuterol, HYDROcodone-acetaminophen, loperamide, magnesium hydroxide, methocarbamol, ondansetron **OR** ondansetron (ZOFRAN) IV, oxymetazoline, sodium chloride, traZODone, zolpidem  Antimicrobials: Anti-infectives (  From admission, onward)    Start     Dose/Rate Route Frequency Ordered Stop   11/26/21 1615  azithromycin (ZITHROMAX) tablet 500 mg        500 mg Oral Daily 11/26/21 1530 11/29/21 0959   11/26/21 1200  vancomycin (VANCOCIN) IVPB 1000 mg/200 mL premix  Status:  Discontinued        1,000 mg 200 mL/hr over 60 Minutes Intravenous Every 12 hours 11/25/21 2322 11/26/21 1046   11/26/21 1200  Ampicillin-Sulbactam (UNASYN) 3 g in sodium chloride 0.9 % 100 mL IVPB        3 g 200 mL/hr over 30 Minutes Intravenous Every 6 hours 11/26/21 1046     11/26/21 1000  metroNIDAZOLE (FLAGYL) IVPB 500 mg  Status:  Discontinued        500 mg 100 mL/hr over 60 Minutes Intravenous Every 12 hours 11/25/21 2231 11/26/21 1046   11/26/21 0600  ceFEPIme (MAXIPIME) 2 g in sodium chloride 0.9 % 100 mL IVPB  Status:  Discontinued        2 g 200 mL/hr over 30 Minutes Intravenous Every 8 hours 11/25/21 2318 11/26/21 1046   11/25/21 2330   vancomycin (VANCOREADY) IVPB 2000 mg/400 mL        2,000 mg 200 mL/hr over 120 Minutes Intravenous  Once 11/25/21 2317 11/26/21 0242   11/25/21 2245  ceFEPIme (MAXIPIME) 2 g in sodium chloride 0.9 % 100 mL IVPB  Status:  Discontinued        2 g 200 mL/hr over 30 Minutes Intravenous  Once 11/25/21 2231 11/25/21 2315   11/25/21 2245  vancomycin (VANCOCIN) IVPB 1000 mg/200 mL premix  Status:  Discontinued        1,000 mg 200 mL/hr over 60 Minutes Intravenous  Once 11/25/21 2231 11/25/21 2316   11/25/21 2215  ceFEPIme (MAXIPIME) 2 g in sodium chloride 0.9 % 100 mL IVPB        2 g 200 mL/hr over 30 Minutes Intravenous  Once 11/25/21 2200 11/25/21 2240   11/25/21 2215  metroNIDAZOLE (FLAGYL) IVPB 500 mg        500 mg 100 mL/hr over 60 Minutes Intravenous  Once 11/25/21 2200 11/25/21 2340   11/25/21 2215  vancomycin (VANCOCIN) IVPB 1000 mg/200 mL premix  Status:  Discontinued        1,000 mg 200 mL/hr over 60 Minutes Intravenous  Once 11/25/21 2200 11/25/21 2316        I have personally reviewed the following labs and images: CBC: Recent Labs  Lab 11/25/21 1717 11/26/21 0344 11/27/21 0711  WBC 7.1 7.2 5.1  NEUTROABS 5.9  --   --   HGB 13.9 13.2 11.2*  HCT 41.4 39.5 33.2*  MCV 92.6 93.2 93.0  PLT 164 163 165   BMP &GFR Recent Labs  Lab 11/25/21 1717 11/26/21 0344 11/26/21 1313 11/27/21 0711  NA 137 136  --  140  K 3.6 3.2*  --  3.2*  CL 102 105  --  108  CO2 24 20*  --  26  GLUCOSE 147* 182*  --  114*  BUN 20 21  --  20  CREATININE 0.93 0.97  --  0.84  CALCIUM 8.4* 7.8*  --  8.1*  MG  --   --  1.8 1.9  PHOS  --   --   --  2.0*   Estimated Creatinine Clearance: 109.2 mL/min (by C-G formula based on SCr of 0.84 mg/dL). Liver & Pancreas: Recent Labs  Lab 11/25/21 1717 11/27/21 0711  AST 21  --   ALT 12  --   ALKPHOS 59  --   BILITOT 0.9  --   PROT 6.7  --   ALBUMIN 3.7 2.8*   No results for input(s): "LIPASE", "AMYLASE" in the last 168 hours. No results for  input(s): "AMMONIA" in the last 168 hours. Diabetic: No results for input(s): "HGBA1C" in the last 72 hours. Recent Labs  Lab 11/23/21 2007 11/26/21 1251 11/26/21 1638 11/26/21 2140 11/27/21 0804  GLUCAP 228* 118* 139* 107* 118*   Cardiac Enzymes: Recent Labs  Lab 11/27/21 0711  CKTOTAL 67   No results for input(s): "PROBNP" in the last 8760 hours. Coagulation Profile: Recent Labs  Lab 11/26/21 0534  INR 1.4*   Thyroid Function Tests: No results for input(s): "TSH", "T4TOTAL", "FREET4", "T3FREE", "THYROIDAB" in the last 72 hours. Lipid Profile: No results for input(s): "CHOL", "HDL", "LDLCALC", "TRIG", "CHOLHDL", "LDLDIRECT" in the last 72 hours. Anemia Panel: No results for input(s): "VITAMINB12", "FOLATE", "FERRITIN", "TIBC", "IRON", "RETICCTPCT" in the last 72 hours. Urine analysis:    Component Value Date/Time   COLORURINE YELLOW 11/26/2021 0344   APPEARANCEUR CLEAR 11/26/2021 0344   LABSPEC 1.038 (H) 11/26/2021 0344   PHURINE 5.0 11/26/2021 0344   GLUCOSEU NEGATIVE 11/26/2021 0344   GLUCOSEU NEGATIVE 06/14/2021 0959   HGBUR SMALL (A) 11/26/2021 0344   HGBUR negative 04/19/2010 1014   BILIRUBINUR NEGATIVE 11/26/2021 0344   KETONESUR 5 (A) 11/26/2021 0344   PROTEINUR 30 (A) 11/26/2021 0344   UROBILINOGEN 0.2 06/14/2021 0959   NITRITE NEGATIVE 11/26/2021 0344   LEUKOCYTESUR NEGATIVE 11/26/2021 0344   Sepsis Labs: Invalid input(s): "PROCALCITONIN", "LACTICIDVEN"  Microbiology: Recent Results (from the past 240 hour(s))  Blood culture (routine x 2)     Status: None (Preliminary result)   Collection Time: 11/25/21  7:27 PM   Specimen: BLOOD  Result Value Ref Range Status   Specimen Description   Final    BLOOD SITE NOT SPECIFIED Performed at [facility] [address], [place], [place] [address]    Special Requests   Final    BOTTLES DRAWN AEROBIC AND ANAEROBIC Blood Culture adequate volume Performed at [facility] [address] [place], [place] [address]    Culture   Final    NO GROWTH 2 DAYS Performed at [facility] [address] [place], [place] [address]    Report Status PENDING  Incomplete  Blood culture (routine x 2)     Status: None (Preliminary result)   Collection Time: 11/26/21  3:44 AM   Specimen: BLOOD  Result Value Ref Range Status   Specimen Description   Final    BLOOD BLOOD RIGHT HAND Performed at [facility] [address] [place], [place] [address]    Special Requests   Final    BOTTLES DRAWN AEROBIC AND ANAEROBIC Blood Culture adequate volume Performed at [facility] [address], [place], [place] [address]    Culture   Final    NO GROWTH < 24 HOURS Performed at [facility] [address], [place], [place] [address]    Report Status PENDING  Incomplete  C Difficile Quick Screen w PCR reflex     Status: None   Collection Time: 11/26/21 10:55 AM   Specimen: STOOL  Result Value Ref Range Status   C Diff antigen NEGATIVE NEGATIVE Final   C Diff toxin NEGATIVE NEGATIVE Final  C Diff interpretation No C. difficile detected.  Final    Comment: Performed at [facility], [place] [address] [place], [place] [address]    Radiology Studies: No results found.    Makhi Muzquiz T. [place]  If 7PM-7AM, please contact night-coverage [email or web address] 11/27/2021, 11:03 AM

## 2021-11-27 NOTE — Plan of Care (Signed)
  Problem: Respiratory: Goal: Ability to maintain adequate ventilation will improve Outcome: Progressing   Problem: Education: Goal: Ability to describe self-care measures that may prevent or decrease complications (Diabetes Survival Skills Education) will improve Outcome: Progressing   Problem: Tissue Perfusion: Goal: Adequacy of tissue perfusion will improve Outcome: Progressing   Problem: Clinical Measurements: Goal: Cardiovascular complication will be avoided Outcome: Progressing

## 2021-11-28 DIAGNOSIS — E669 Obesity, unspecified: Secondary | ICD-10-CM

## 2021-11-28 DIAGNOSIS — Z6834 Body mass index (BMI) 34.0-34.9, adult: Secondary | ICD-10-CM

## 2021-11-28 DIAGNOSIS — A419 Sepsis, unspecified organism: Secondary | ICD-10-CM | POA: Diagnosis not present

## 2021-11-28 DIAGNOSIS — A4151 Sepsis due to Escherichia coli [E. coli]: Principal | ICD-10-CM

## 2021-11-28 DIAGNOSIS — N401 Enlarged prostate with lower urinary tract symptoms: Secondary | ICD-10-CM | POA: Diagnosis not present

## 2021-11-28 DIAGNOSIS — R112 Nausea with vomiting, unspecified: Secondary | ICD-10-CM | POA: Diagnosis not present

## 2021-11-28 DIAGNOSIS — G894 Chronic pain syndrome: Secondary | ICD-10-CM | POA: Diagnosis not present

## 2021-11-28 DIAGNOSIS — R5381 Other malaise: Secondary | ICD-10-CM

## 2021-11-28 DIAGNOSIS — A042 Enteroinvasive Escherichia coli infection: Secondary | ICD-10-CM

## 2021-11-28 LAB — RENAL FUNCTION PANEL
Albumin: 2.7 g/dL — ABNORMAL LOW (ref 3.5–5.0)
Anion gap: 5 (ref 5–15)
BUN: 16 mg/dL (ref 8–23)
CO2: 22 mmol/L (ref 22–32)
Calcium: 8.3 mg/dL — ABNORMAL LOW (ref 8.9–10.3)
Chloride: 114 mmol/L — ABNORMAL HIGH (ref 98–111)
Creatinine, Ser: 0.8 mg/dL (ref 0.61–1.24)
GFR, Estimated: >60 mL/min (ref >60)
Glucose, Bld: 112 mg/dL — ABNORMAL HIGH (ref 70–99)
Phosphorus: 2.8 mg/dL (ref 2.5–4.6)
Potassium: 4.3 mmol/L (ref 3.5–5.1)
Sodium: 141 mmol/L (ref 135–145)

## 2021-11-28 LAB — CBC
HCT: 32 % — ABNORMAL LOW (ref 39.0–52.0)
Hemoglobin: 10.5 g/dL — ABNORMAL LOW (ref 13.0–17.0)
MCH: 30.3 pg (ref 26.0–34.0)
MCHC: 32.8 g/dL (ref 30.0–36.0)
MCV: 92.2 fL (ref 80.0–100.0)
Platelets: 180 10*3/uL (ref 150–400)
RBC: 3.47 MIL/uL — ABNORMAL LOW (ref 4.22–5.81)
RDW: 13.3 % (ref 11.5–15.5)
WBC: 4.5 10*3/uL (ref 4.0–10.5)
nRBC: 0 % (ref 0.0–0.2)

## 2021-11-28 LAB — MAGNESIUM: Magnesium: 1.7 mg/dL (ref 1.7–2.4)

## 2021-11-28 MED ORDER — VALACYCLOVIR HCL 500 MG PO TABS
2000.0000 mg | ORAL_TABLET | Freq: Two times a day (BID) | ORAL | Status: AC
  Administered 2021-11-28 – 2021-11-29 (×2): 2000 mg via ORAL
  Filled 2021-11-28 (×2): qty 4

## 2021-11-28 NOTE — Evaluation (Signed)
Physical Therapy Evaluation Patient Details Name: Travis FALCONI Sr. MRN: [id number] DOB: Jan 13, 1951 Today's Date: 11/28/2021  History of Present Illness  71 yo male admitted with sepsis. Also has hx of back pain-awaiting surgery per chart review. Hx of bil TKA, R BKA, bil THA, shourder surgery, DM  Clinical Impression  On eval, pt was Min guard-Min A for mobility. He walked ~15 feet around the room while holding on to his cane and the IV pole for support. He reported some dizziness and he had some unsteadiness. Ambulation distance limited due to pt not having shoe for L foot (pt stated someone is supposed to bring it tomorrow). Discussed d/c plan-pt plans to return home. May need to consider some home health PT f/u however pt doesn't feel like he will need it. Will plan to follow and progress activity as tolerated.      Recommendations for follow up therapy are one component of a multi-disciplinary discharge planning process, led by the attending physician.  Recommendations may be updated based on patient status, additional functional criteria and insurance authorization.  Follow Up Recommendations Home health PT (if pt will agree to it-he doesn't feel like he needs it)      Assistance Recommended at Discharge PRN  Patient can return home with the following  A little help with walking and/or transfers;A little help with bathing/dressing/bathroom;Assist for transportation;Assistance with cooking/housework;Help with stairs or ramp for entrance    Equipment Recommendations  (continuing to assess-may need a RW-if pt is agreeable)  Recommendations for Other Services  OT consult    Functional Status Assessment Patient has had a recent decline in their functional status and demonstrates the ability to make significant improvements in function in a reasonable and predictable amount of time.     Precautions / Restrictions Precautions Precautions: Fall; R BKA (has prosthesis in  room) Restrictions Weight Bearing Restrictions: No      Mobility  Bed Mobility Overal bed mobility: Needs Assistance Bed Mobility: Supine to Sit     Supine to sit: Min assist, HOB elevated     General bed mobility comments: Assist for trunk. Increased time.    Transfers Overall transfer level: Needs assistance Equipment used: Straight cane Transfers: Sit to/from Stand Sit to Stand: From elevated surface, Min guard           General transfer comment: Min guard for safety. Cues for safety. C/o some dizziness.    Ambulation/Gait Ambulation/Gait assistance: Min assist Gait Distance (Feet): 15 Feet Assistive device: Straight cane (cane + IV pole) Gait Pattern/deviations: Step-through pattern       General Gait Details: Limited distance due to pt not having shoe for L foot. He was able to make it around the bed but he did need 2 points of support (cane + IV pole).  Stairs            Wheelchair Mobility    Modified Rankin (Stroke Patients Only)       Balance Overall balance assessment: Needs assistance         Standing balance support: Bilateral upper extremity supported Standing balance-Leahy Scale: Poor                               Pertinent Vitals/Pain Pain Assessment Pain Assessment: No/denies pain    Home Living Family/patient expects to be discharged to:: Private residence Living Arrangements: Alone Available Help at Discharge: Family;Available PRN/intermittently Type of Home: Mobile home Home  Access: Stairs to enter Entrance Stairs-Rails: Left;Right;Can reach both Entrance Stairs-Number of Steps: 4   Home Layout: One level Home Equipment: Conservation officer, nature (2 wheels);Toilet riser;Grab bars - tub/shower      Prior Function Prior Level of Function : Independent/Modified Independent             Mobility Comments: Use a cane as needed with prothetic limb.       Hand Dominance   Dominant Hand: Right     Extremity/Trunk Assessment   Upper Extremity Assessment Upper Extremity Assessment: Defer to OT evaluation    Lower Extremity Assessment Lower Extremity Assessment: Generalized weakness    Cervical / Trunk Assessment Cervical / Trunk Assessment: Normal  Communication   Communication: No difficulties  Cognition Arousal/Alertness: Awake/alert Behavior During Therapy: WFL for tasks assessed/performed Overall Cognitive Status: Within Functional Limits for tasks assessed                                          General Comments      Exercises     Assessment/Plan    PT Assessment Patient needs continued PT services  PT Problem List Decreased strength;Decreased balance;Decreased mobility;Decreased activity tolerance;Decreased knowledge of use of DME       PT Treatment Interventions DME instruction;Gait training;Functional mobility training;Balance training;Patient/family education;Therapeutic exercise;Therapeutic activities    PT Goals (Current goals can be found in the Care Plan section)  Acute Rehab PT Goals Patient Stated Goal: home soon PT Goal Formulation: With patient Time For Goal Achievement: 12/12/21 Potential to Achieve Goals: Good    Frequency Min 3X/week     Co-evaluation               AM-PAC PT "6 Clicks" Mobility  Outcome Measure Help needed turning from your back to your side while in a flat bed without using bedrails?: A Little Help needed moving from lying on your back to sitting on the side of a flat bed without using bedrails?: A Little Help needed moving to and from a bed to a chair (including a wheelchair)?: A Little Help needed standing up from a chair using your arms (e.g., wheelchair or bedside chair)?: A Little Help needed to walk in hospital room?: A Little Help needed climbing 3-5 steps with a railing? : Total 6 Click Score: 16    End of Session   Activity Tolerance: Patient tolerated treatment well Patient left:  in chair;with call bell/phone within reach        Time: 9528-4132 PT Time Calculation (min) (ACUTE ONLY): 18 min   Charges:   PT Evaluation $PT Eval Moderate Complexity: 1 Mod            Doreatha Massed, PT Acute Rehabilitation  Office: [phone] Pager: [phone]

## 2021-11-28 NOTE — Progress Notes (Signed)
PROGRESS NOTE  Travis [name] CLE:[id number] DOB: 06/13/1950   PCP: [name], MD  Patient is from: Home.  Lives alone.  Has prosthetics for right BKA  DOA: 11/25/2021 LOS: 3  Chief complaints Chief Complaint  Patient presents with   Back Pain     Brief Narrative / Interim history: 71 year old M with PMH of CAD s/p multiple stents, DM-2, OSA on CPAP, HTN, chronic pain syndrome, right BKA, anxiety, depression, Mobitz type I, pancreatic insufficiency, chronic sinusitis s/p sinus surgery on 7/5 presenting with increased low back pain and diarrhea for about 2 days.  He was referred by his primary care physician to neurosurgery and is awaiting on insurance to clear him for surgery "SI fusion".  In ED, febrile to 102.1.  Other vitals within normal.  CBC, CMP and CXR without significant finding.  Head and maxillofacial CT showed severe pansinus paranasal sinus disease.  Lumbar MRI markedly limited study negative for cauda equina impingement or high-grade neural foraminal stenosis.  UA, urine culture, blood culture and CT abdomen and pelvis ordered.  Patient was started on broad-spectrum antibiotics and IV fluid and admitted for sepsis.  The next day, CT abdomen and pelvis concerning for pancolitis, cholelithiasis and sludge.  UA without significant finding.  Blood cultures NGTD.  C. difficile and GIP ordered.  Antibiotics de-escalated to Unasyn and azithromycin.  C. difficile negative.  GIP positive for EIEC.  Antibiotics changed to Cipro.    Subjective: Seen and examined earlier this morning.  No major events overnight of this morning.  He says he feels better today.  His energy is better.  He had loose bowel movements earlier this morning.  Some nausea earlier but resolved.  Objective: Vitals:   11/27/21 2159 11/28/21 0611 11/28/21 0740 11/28/21 1331  BP: (!) 151/64 (!) 158/58  (!) 117/53  Pulse: 60 61  91  Resp: 18 18  18   Temp: 98.6 F (37 C) 98.5 F (36.9 C)  98.6 F (37  C)  TempSrc: Oral Oral  Oral  SpO2: 95% 98% 98% 98%  Weight:      Height:        Examination:  GENERAL: No apparent distress.  Nontoxic. HEENT: MMM.  Vision and hearing grossly intact.  NECK: Supple.  No apparent JVD.  RESP:  No IWOB.  Fair aeration bilaterally. CVS:  RRR. Heart sounds normal.  ABD/GI/GU: BS+. Abd soft, NTND.  MSK/EXT:  Moves extremities.  Right BKA. SKIN: no apparent skin lesion or wound NEURO: Awake and alert. Oriented appropriately.  No apparent focal neuro deficit. PSYCH: Calm. Normal affect.   Procedures:  None  Microbiology summarized: Blood cultures NGTD. Urine culture NGTD C. difficile negative. GIP positive for EIEC  Assessment and plan: Principal Problem:   Sepsis ([place]) Active Problems:   Low back pain   Hyperlipidemia   Chronic pain syndrome   Essential hypertension   Physical deconditioning   Diabetes mellitus type 2 in obese (HCC)   Nausea and vomiting   Obesity   OSA on CPAP   Controlled diabetes mellitus with hyperglycemia (HCC)   Major depression   S/P unilateral BKA (below knee amputation), right (HCC)   Coronary artery disease   Normocytic anemia   BPH (benign prostatic hyperplasia)   Pancolitis (HCC)   Pancreatic insufficiency   Hypophosphatemia   Colitis due to enteroinvasive Escherichia coli  Sepsis due to pancolitis in the setting of enteroinvasive E. coli infection: POA.  Diarrhea, fever and tachycardia on presentation.  CT  shows pancolitis.    Abdominal exam benign.  Blood and urine cultures NGTD.  C. difficile negative.  Pro-Cal elevated to 5.7.  GIP positive for EIEC.  Diarrhea improved.  Sepsis physiology resolved. -Continue IV Unasyn and azithromycin -Follow GIP -Continue Imodium as needed -Continue IV fluid with KCl   Acute on chronic low back pain-no acute finding on lumbar MRI.  Patient states plan for "SI fusion" outpatient.  He has history of chronic pain syndrome -Multimodal pain control  Chronic  sinusitis s/p surgery by ENT:  Was on Augmentin POA. -IV Unasyn>> Cipro -Saline nose spray, azelastine and Afrin  Nausea and vomiting: Could be due to acute illness.  He was on Ozempic at home which could contribute.  Improved. -Continue IV fluid and antiemetics  Normocytic anemia: Slight drop in Hgb likely dilutional from IV fluid. Recent Labs    03/17/21 1418 03/18/21 0112 03/19/21 0220 04/13/21 1521 06/14/21 0959 11/16/21 1430 11/25/21 1717 11/26/21 0344 11/27/21 0711 11/28/21 0419  HGB 12.2* 11.1* 10.6* 11.4* 12.3* 12.5* 13.9 13.2 11.2* 10.5*  -Check anemia panel  BPH with urinary incontinence -Continue home Flomax -He is also on Myrbetriq likely for overactive bladder   History of CAD s/p multiple stents; no cardiopulmonary symptoms -Continue Effient, low-dose aspirin, Cozaar and statin therapy.   Major depression -continue Abilify and Cymbalta.   Controlled NIDDM-2 with hyperglycemia and neuropathy: Seems to be on Ozempic at home.  A1c 5.1%. -Discontinued CBG monitoring and SSI   Essential hypertension: SBP slightly elevated to 150s. -Continue home losartan. -Continue holding HCTZ   Hyperlipidemia -Continue statin.  Right BKA/physical deconditioning: Uses prosthesis -OOB/PT/OT eval  Hypokalemia/hypophosphatemia: Likely due to HCTZ.  And diarrhea.  Resolved. -Monitor and replenish  Pancreatic insufficiency -Continue Creon.  Obesity Body mass index is 34.71 kg/m.           DVT prophylaxis:  Place and maintain sequential compression device Start: 11/27/21 0956 Subcu Lovenox.  Code Status: Partial code Family Communication: None at bedside Level of care: Telemetry Status is: Inpatient Remains inpatient appropriate because: Due to nausea, diarrhea and pancolitis   Final disposition: TBD Consultants:  None  Sch Meds:  Scheduled Meds:  albuterol  2.5 mg Nebulization BID   ARIPiprazole  10 mg Oral Daily   aspirin EC  81 mg Oral QHS    atorvastatin  20 mg Oral Daily   azelastine  2 spray Each Nare BID   Buprenorphine HCl  600 mcg Buccal BID   calcium carbonate  1 tablet Oral TID WC   celecoxib  200 mg Oral BID   cholecalciferol  1,000 Units Oral QHS   ciprofloxacin  500 mg Oral BID   donepezil  10 mg Oral QHS   DULoxetine  20 mg Oral BID   enoxaparin (LOVENOX) injection  55 mg Subcutaneous Q24H   ferrous sulfate   Oral Daily   gabapentin  800 mg Oral TID   lipase/protease/amylase  36,000 Units Oral With snacks   lipase/protease/amylase  72,000 Units Oral TID AC   loratadine  10 mg Oral Daily   losartan  50 mg Oral Daily   mirabegron ER  50 mg Oral QHS   omega-3 acid ethyl esters  2 g Oral Daily   prasugrel  5 mg Oral Daily   tamsulosin  0.4 mg Oral BID   traZODone  100 mg Oral QHS   Continuous Infusions:  0.9 % NaCl with KCl 40 mEq / L 125 mL/hr at 11/28/21 0919   PRN Meds:.acetaminophen **  OR** acetaminophen, albuterol, HYDROcodone-acetaminophen, hydrOXYzine, lip balm, loperamide, magnesium hydroxide, methocarbamol, ondansetron **OR** ondansetron (ZOFRAN) IV, oxymetazoline, sodium chloride, traZODone, zolpidem  Antimicrobials: Anti-infectives (From admission, onward)    Start     Dose/Rate Route Frequency Ordered Stop   11/27/21 1800  ciprofloxacin (CIPRO) tablet 500 mg        500 mg Oral 2 times daily 11/27/21 1708 12/02/21 1959   11/26/21 1615  azithromycin (ZITHROMAX) tablet 500 mg  Status:  Discontinued        500 mg Oral Daily 11/26/21 1530 11/27/21 1708   11/26/21 1200  vancomycin (VANCOCIN) IVPB 1000 mg/200 mL premix  Status:  Discontinued        1,000 mg 200 mL/hr over 60 Minutes Intravenous Every 12 hours 11/25/21 2322 11/26/21 1046   11/26/21 1200  Ampicillin-Sulbactam (UNASYN) 3 g in sodium chloride 0.9 % 100 mL IVPB  Status:  Discontinued        3 g 200 mL/hr over 30 Minutes Intravenous Every 6 hours 11/26/21 1046 11/27/21 1708   11/26/21 1000  metroNIDAZOLE (FLAGYL) IVPB 500 mg  Status:   Discontinued        500 mg 100 mL/hr over 60 Minutes Intravenous Every 12 hours 11/25/21 2231 11/26/21 1046   11/26/21 0600  ceFEPIme (MAXIPIME) 2 g in sodium chloride 0.9 % 100 mL IVPB  Status:  Discontinued        2 g 200 mL/hr over 30 Minutes Intravenous Every 8 hours 11/25/21 2318 11/26/21 1046   11/25/21 2330  vancomycin (VANCOREADY) IVPB 2000 mg/400 mL        2,000 mg 200 mL/hr over 120 Minutes Intravenous  Once 11/25/21 2317 11/26/21 0242   11/25/21 2245  ceFEPIme (MAXIPIME) 2 g in sodium chloride 0.9 % 100 mL IVPB  Status:  Discontinued        2 g 200 mL/hr over 30 Minutes Intravenous  Once 11/25/21 2231 11/25/21 2315   11/25/21 2245  vancomycin (VANCOCIN) IVPB 1000 mg/200 mL premix  Status:  Discontinued        1,000 mg 200 mL/hr over 60 Minutes Intravenous  Once 11/25/21 2231 11/25/21 2316   11/25/21 2215  ceFEPIme (MAXIPIME) 2 g in sodium chloride 0.9 % 100 mL IVPB        2 g 200 mL/hr over 30 Minutes Intravenous  Once 11/25/21 2200 11/25/21 2240   11/25/21 2215  metroNIDAZOLE (FLAGYL) IVPB 500 mg        500 mg 100 mL/hr over 60 Minutes Intravenous  Once 11/25/21 2200 11/25/21 2340   11/25/21 2215  vancomycin (VANCOCIN) IVPB 1000 mg/200 mL premix  Status:  Discontinued        1,000 mg 200 mL/hr over 60 Minutes Intravenous  Once 11/25/21 2200 11/25/21 2316        I have personally reviewed the following labs and images: CBC: Recent Labs  Lab 11/25/21 1717 11/26/21 0344 11/27/21 0711 11/28/21 0419  WBC 7.1 7.2 5.1 4.5  NEUTROABS 5.9  --   --   --   HGB 13.9 13.2 11.2* 10.5*  HCT 41.4 39.5 33.2* 32.0*  MCV 92.6 93.2 93.0 92.2  PLT 164 163 165 180   BMP &GFR Recent Labs  Lab 11/25/21 1717 11/26/21 0344 11/26/21 1313 11/27/21 0711 11/28/21 0419  NA 137 136  --  140 141  K 3.6 3.2*  --  3.2* 4.3  CL 102 105  --  108 114*  CO2 24 20*  --  26 22  GLUCOSE 147* 182*  --  114* 112*  BUN 20 21  --  20 16  CREATININE 0.93 0.97  --  0.84 0.80  CALCIUM 8.4* 7.8*   --  8.1* 8.3*  MG  --   --  1.8 1.9 1.7  PHOS  --   --   --  2.0* 2.8   Estimated Creatinine Clearance: 114.6 mL/min (by C-G formula based on SCr of 0.8 mg/dL). Liver & Pancreas: Recent Labs  Lab 11/25/21 1717 11/27/21 0711 11/28/21 0419  AST 21  --   --   ALT 12  --   --   ALKPHOS 59  --   --   BILITOT 0.9  --   --   PROT 6.7  --   --   ALBUMIN 3.7 2.8* 2.7*   No results for input(s): "LIPASE", "AMYLASE" in the last 168 hours. No results for input(s): "AMMONIA" in the last 168 hours. Diabetic: Recent Labs    11/27/21 0711  HGBA1C 5.1   Recent Labs  Lab 11/26/21 1638 11/26/21 2140 11/27/21 0804 11/27/21 1111 11/27/21 2200  GLUCAP 139* 107* 118* 135* 119*   Cardiac Enzymes: Recent Labs  Lab 11/27/21 0711  CKTOTAL 67   No results for input(s): "PROBNP" in the last 8760 hours. Coagulation Profile: Recent Labs  Lab 11/26/21 0534  INR 1.4*   Thyroid Function Tests: No results for input(s): "TSH", "T4TOTAL", "FREET4", "T3FREE", "THYROIDAB" in the last 72 hours. Lipid Profile: No results for input(s): "CHOL", "HDL", "LDLCALC", "TRIG", "CHOLHDL", "LDLDIRECT" in the last 72 hours. Anemia Panel: No results for input(s): "VITAMINB12", "FOLATE", "FERRITIN", "TIBC", "IRON", "RETICCTPCT" in the last 72 hours. Urine analysis:    Component Value Date/Time   COLORURINE YELLOW 11/26/2021 0344   APPEARANCEUR CLEAR 11/26/2021 0344   LABSPEC 1.038 (H) 11/26/2021 0344   PHURINE 5.0 11/26/2021 0344   GLUCOSEU NEGATIVE 11/26/2021 0344   GLUCOSEU NEGATIVE 06/14/2021 0959   HGBUR SMALL (A) 11/26/2021 0344   HGBUR negative 04/19/2010 1014   BILIRUBINUR NEGATIVE 11/26/2021 0344   KETONESUR 5 (A) 11/26/2021 0344   PROTEINUR 30 (A) 11/26/2021 0344   UROBILINOGEN 0.2 06/14/2021 0959   NITRITE NEGATIVE 11/26/2021 0344   LEUKOCYTESUR NEGATIVE 11/26/2021 0344   Sepsis Labs: Invalid input(s): "PROCALCITONIN", "LACTICIDVEN"  Microbiology: Recent Results (from the past 240  hour(s))  Blood culture (routine x 2)     Status: None (Preliminary result)   Collection Time: 11/25/21  7:27 PM   Specimen: BLOOD  Result Value Ref Range Status   Specimen Description   Final    BLOOD SITE NOT SPECIFIED Performed at [facility] [address] [place], [place] [address]    Special Requests   Final    BOTTLES DRAWN AEROBIC AND ANAEROBIC Blood Culture adequate volume Performed at [facility] [address], [place], [place] [address]    Culture   Final    NO GROWTH 3 DAYS Performed at [facility], [place] [address] [place], [place] [address]    Report Status PENDING  Incomplete  Blood culture (routine x 2)     Status: None (Preliminary result)   Collection Time: 11/26/21  3:44 AM   Specimen: BLOOD  Result Value Ref Range Status   Specimen Description   Final    BLOOD BLOOD RIGHT HAND Performed at [facility] [address] [place], [place] [address]    Special Requests   Final    BOTTLES DRAWN AEROBIC AND  ANAEROBIC Blood Culture adequate volume Performed at [facility] [address], [place], [place] [address]    Culture   Final    NO GROWTH 2 DAYS Performed at [facility] [address] [place], [place] [address]    Report Status PENDING  Incomplete  Urine Culture     Status: None   Collection Time: 11/26/21  6:06 AM   Specimen: Urine, Catheterized  Result Value Ref Range Status   Specimen Description   Final    URINE, CATHETERIZED Performed at [facility] [address], [place], [place] [address]    Special Requests   Final    NONE Performed at [facility], [place] [address] [place], [place] [address]    Culture   Final    NO GROWTH Performed at [facility], [place] [address] [place], [place] [address]    Report Status 11/27/2021 FINAL  Final  Gastrointestinal Panel by PCR , Stool     Status: Abnormal    Collection Time: 11/26/21  8:43 AM   Specimen: STOOL  Result Value Ref Range Status   Campylobacter species NOT DETECTED NOT DETECTED Final   Plesimonas shigelloides NOT DETECTED NOT DETECTED Final   Salmonella species NOT DETECTED NOT DETECTED Final   Yersinia enterocolitica NOT DETECTED NOT DETECTED Final   Vibrio species NOT DETECTED NOT DETECTED Final   Vibrio cholerae NOT DETECTED NOT DETECTED Final   Enteroaggregative E coli (EAEC) NOT DETECTED NOT DETECTED Final   Enteropathogenic E coli (EPEC) NOT DETECTED NOT DETECTED Final   Enterotoxigenic E coli (ETEC) NOT DETECTED NOT DETECTED Final   Shiga like toxin producing E coli (STEC) NOT DETECTED NOT DETECTED Final   Shigella/Enteroinvasive E coli (EIEC) DETECTED (A) NOT DETECTED Final    Comment: RESULT CALLED TO, READ BACK BY AND VERIFIED WITH: SAKARA MAILELE AT 1643 11/27/21.PMF    Cryptosporidium NOT DETECTED NOT DETECTED Final   Cyclospora cayetanensis NOT DETECTED NOT DETECTED Final   Entamoeba histolytica NOT DETECTED NOT DETECTED Final   Giardia lamblia NOT DETECTED NOT DETECTED Final   Adenovirus F40/41 NOT DETECTED NOT DETECTED Final   Astrovirus NOT DETECTED NOT DETECTED Final   Norovirus GI/GII NOT DETECTED NOT DETECTED Final   Rotavirus A NOT DETECTED NOT DETECTED Final   Sapovirus (I, II, IV, and V) NOT DETECTED NOT DETECTED Final    Comment: Performed at [facility], [place], [place] [address]  C Difficile Quick Screen w PCR reflex     Status: None   Collection Time: 11/26/21 10:55 AM   Specimen: STOOL  Result Value Ref Range Status   C Diff antigen NEGATIVE NEGATIVE Final   C Diff toxin NEGATIVE NEGATIVE Final   C Diff interpretation No C. difficile detected.  Final    Comment: Performed at [facility], [place] [address], [place], [place] [address]    Radiology Studies: No results found.    Cotton Beckley T. Adair  If 7PM-7AM, please contact  night-coverage [email or web address] 11/28/2021, 1:32 PM

## 2021-11-28 NOTE — Progress Notes (Unsigned)
  Office: [phone]  /  Fax: [phone]    Date: 12/12/2021   Appointment Start Time: *** Duration: *** minutes Provider: Glennie Isle, Psy.D. Type of Session: Individual Therapy  Location of Patient: {gbptloc:23249} (private location) Location of Provider: Provider's Home (private office) Type of Contact: Telepsychological Visit via MyChart Video Visit  Session Content: Travis [name] is a 71 y.o. male presenting for a follow-up appointment to address the previously established treatment goal of increasing coping skills.Today's appointment was a telepsychological visit. Travis [name] provided verbal consent for today's telepsychological appointment and he is aware he is responsible for securing confidentiality on his end of the session. Prior to proceeding with today's appointment, Travis [name]'s physical location at the time of this appointment was obtained as well a phone number he could be reached at in the event of technical difficulties. Travis [name] and this provider participated in today's telepsychological service.   This provider conducted a brief check-in. *** Travis [name] was receptive to today's appointment as evidenced by openness to sharing, responsiveness to feedback, and {gbreceptiveness:23401}.  Mental Status Examination:  Appearance: {Appearance:22431} Behavior: {Behavior:22445} Mood: {gbmood:21757} Affect: {Affect:22436} Speech: {Speech:22432} Eye Contact: {Eye Contact:22433} Psychomotor Activity: {Motor Activity:22434} Gait: {gbgait:23404} Thought Process: {thought process:22448}  Thought Content/Perception: {disturbances:22451} Orientation: {Orientation:22437} Memory/Concentration: {gbcognition:22449} Insight: {Insight:22446} Judgment: {Insight:22446}  Interventions:  {Interventions for Progress Notes:23405}  DSM-5 Diagnosis(es): F50.89 Other Specified Feeding or Eating Disorder, Emotional and Binge Eating Behaviors  Treatment Goal & Progress: During the initial appointment with  this provider, the following treatment goal was established: increase coping skills. Travis [name] has demonstrated progress in his goal as evidenced by {gbtxprogress:22839}. Travis [name] also {gbtxprogress2:22951}.  Plan: The next appointment is scheduled for *** at ***, which will be via MyChart Video Visit. The next session will focus on {Plan for Next Appointment:23400}.

## 2021-11-28 NOTE — TOC Initial Note (Signed)
Transition of Care (TOC) - Initial/Assessment Note    Patient Details  Name: Travis MATHEW Sr. MRN: [id number] Date of Birth: 1951-04-24  Transition of Care [facility]) CM/SW Contact:    Leeroy Cha, RN Phone Number: 11/28/2021, 9:42 AM  Clinical Narrative:                  Transition of Care [facility]) Screening Note   Patient Details  Name: Travis WILMES Sr. Date of Birth: [birth year]   Transition of Care [facility]) CM/SW Contact:    Leeroy Cha, RN Phone Number: 11/28/2021, 9:42 AM    Transition of Care Department [facility]) has reviewed patient and no TOC needs have been identified at this time. We will continue to monitor patient advancement through interdisciplinary progression rounds. If new patient transition needs arise, please place a TOC consult.    Expected Discharge Plan: Home/Self Care Barriers to Discharge: Continued Medical Work up   Patient Goals and CMS Choice Patient states their goals for this hospitalization and ongoing recovery are:: to go home CMS Medicare.gov Compare Post Acute Care list provided to:: Patient    Expected Discharge Plan and Services Expected Discharge Plan: Home/Self Care   Discharge Planning Services: CM Consult   Living arrangements for the past 2 months: Apartment                                      Prior Living Arrangements/Services Living arrangements for the past 2 months: Apartment Lives with:: Self Patient language and need for interpreter reviewed:: Yes Do you feel safe going back to the place where you live?: Yes            Criminal Activity/Legal Involvement Pertinent to Current Situation/Hospitalization: No - Comment as needed  Activities of Daily Living Home Assistive Devices/Equipment: Prosthesis, Cane (specify quad or straight) ADL Screening (condition at time of admission) Patient's cognitive ability adequate to safely complete daily activities?: Yes Is the patient deaf or have difficulty  hearing?: No Does the patient have difficulty seeing, even when wearing glasses/contacts?: No Does the patient have difficulty concentrating, remembering, or making decisions?: No Patient able to express need for assistance with ADLs?: Yes Does the patient have difficulty dressing or bathing?: No Independently performs ADLs?: Yes (appropriate for developmental age) Does the patient have difficulty walking or climbing stairs?: Yes Weakness of Legs: None Weakness of Arms/Hands: None  Permission Sought/Granted                  Emotional Assessment Appearance:: Appears stated age     Orientation: : Oriented to Place, Oriented to  Time, Oriented to Situation Alcohol / Substance Use: Not Applicable Psych Involvement: No (comment)  Admission diagnosis:  Sepsis ([place]) [A41.9] Fever, unspecified fever cause [R50.9] Acute midline low back pain, unspecified whether sciatica present [M54.50] Patient Active Problem List   Diagnosis Date Noted   Hypophosphatemia 11/27/2021   BPH (benign prostatic hyperplasia) 11/26/2021   Pancolitis ([place]) 11/26/2021   Pancreatic insufficiency 11/26/2021   Sepsis ([place]) 11/25/2021   Chronic sinusitis 11/23/2021   Hypertension associated with type 2 diabetes mellitus ([place]) 10/25/2021   Foot callus 65/07/5463   Folliculitis 68/04/7516   Central sleep apnea comorbid with prescribed opioid use 08/17/2021   Obstructive sleep apnea on CPAP 08/17/2021   Chronic intermittent hypoxia with obstructive sleep apnea 08/17/2021   Subjective memory complaints 08/17/2021   Word finding difficulty 08/17/2021  Eating disorder 06/28/2021   Abnormal laboratory test 06/21/2021   COVID-19 virus infection 06/03/2021   Arthritis of left shoulder region    AV block, [place] 2 03/18/2021   S/P reverse total shoulder arthroplasty, left 03/17/2021   Iliotibial band syndrome of both sides 03/16/2021   Chronic respiratory failure with hypoxia (HCC) 02/26/2021   Dyspnea  01/14/2021   Sinusitis 11/02/2020   Memory loss or impairment 10/11/2020   Right arm pain 09/18/2020   Aortic atherosclerosis ([place]) 09/16/2020   Spondylolisthesis, lumbar region    Status post lumbar spinal fusion 07/27/2020   Nasal sinus polyp 06/16/2020   Vitamin D deficiency 02/24/2020   Cough 07/29/2019   Wheezing 07/29/2019   Possible exposure to STD 07/24/2019   Unilateral primary osteoarthritis, left hip 06/06/2019   Status post total replacement of left hip 06/06/2019   S/P laparoscopic sleeve gastrectomy 03/03/2019   Rash 01/24/2019   Fever 08/15/2018   UTI (urinary tract infection) 08/15/2018   Fall at home, initial encounter 08/15/2018   Chronic anemia 08/15/2018   Hypokalemia 08/15/2018   Bowel incontinence 07/25/2018   Other spondylosis with radiculopathy, lumbar region 07/16/2018    Class: Chronic   Status post lumbar laminectomy 07/16/2018   Status post THR (total hip replacement) 04/17/2018   Unilateral primary osteoarthritis, right hip    Myocardial infarction ([place]) 02/25/2018   Coronary artery disease 02/25/2018   PAD (peripheral artery disease) ([place]) 02/25/2018   S/P BKA (below knee amputation) unilateral, right ([place]) 02/25/2018   Sacroiliitis ([place]) 02/25/2018   SOB (shortness of breath) 01/03/2018   Acute on chronic heart failure ([place]) 01/03/2018   Severe right groin pain 12/20/2017   Morbid obesity ([place]) 10/09/2017   Low back pain 07/13/2017   Leukoplakia, tongue 01/26/2017   Skin lesion 10/20/2016   S/P unilateral BKA (below knee amputation), right ([place]) 06/14/2016   Charcot foot due to diabetes mellitus ([place])    Charcot's arthropathy associated with type 2 diabetes mellitus ([place]) 04/11/2016   Encounter for well adult exam with abnormal findings 11/05/2015   Major depression 09/13/2015   S/P TKR (total knee replacement) bilaterally 09/13/2015   GERD (gastroesophageal reflux disease) 09/08/2015   S/P laparoscopic hernia repair 05/11/2015   PPD  positive 04/08/2015   Benign neoplasm of descending colon    Benign neoplasm of cecum    Acute blood loss as cause of postoperative anemia    Chronic anticoagulation    Occult blood positive stool 10/17/2014   General weakness 07/14/2014   Urinary incontinence 07/14/2014   Controlled diabetes mellitus with hyperglycemia ([place]) 05/06/2014   Hypotension during surgery 01/30/2014    Class: Acute   Headache(784.0) 10/15/2013   Spinal stenosis in cervical region 09/26/2013    Class: Chronic   Congenital spinal stenosis of lumbar region 09/26/2013    Class: Chronic   Hand joint pain 06/10/2013   Rotator cuff tear arthropathy of both shoulders 06/10/2013   Skin lesion of cheek 05/01/2013   Pain of right thumb 04/03/2013   Balance disorder 03/12/2013   Gait disorder 03/12/2013   Tremor 03/12/2013   Left hip pain 03/12/2013   Pre-ulcerative corn or callous 02/06/2013   Anxiety 11/12/2011   OSA on CPAP 11/07/2011   Bradycardia 10/20/2011   Insomnia 10/04/2011   Obesity 01/12/2011   Nausea and vomiting 09/28/2010   Diabetes mellitus type 2 in obese ([place]) 09/27/2010   ERECTILE DYSFUNCTION, ORGANIC 05/30/2010   Degenerative disc disease, lumbar 04/19/2010   SCIATICA, LEFT 04/19/2010   Chronic  pain syndrome 10/27/2009   Hyperlipidemia 07/15/2009   Essential hypertension 06/24/2009   Coronary artery disease involving native coronary artery of native heart without angina pectoris 06/24/2009   Allergic rhinitis 06/24/2009   URETHRAL STRICTURE 06/24/2009   DEGENERATIVE JOINT DISEASE 06/24/2009   SHOULDER PAIN, BILATERAL 06/24/2009   FATIGUE 06/24/2009   NEPHROLITHIASIS, HX OF 06/24/2009   PCP:  [name], MD Pharmacy:   [facility], [place] [address] [place] [address] Phone: [phone] Fax: [phone]     Social Determinants of Health (SDOH) Interventions    Readmission Risk Interventions     No data to  display

## 2021-11-28 NOTE — Progress Notes (Signed)
Pt refused cpap

## 2021-11-29 DIAGNOSIS — F320 Major depressive disorder, single episode, mild: Secondary | ICD-10-CM

## 2021-11-29 DIAGNOSIS — E1169 Type 2 diabetes mellitus with other specified complication: Secondary | ICD-10-CM

## 2021-11-29 LAB — IRON AND TIBC
Iron: 55 ug/dL (ref 45–182)
Saturation Ratios: 29 % (ref 17.9–39.5)
TIBC: 192 ug/dL — ABNORMAL LOW (ref 250–450)
UIBC: 137 ug/dL

## 2021-11-29 LAB — CBC
HCT: 31.9 % — ABNORMAL LOW (ref 39.0–52.0)
Hemoglobin: 10.7 g/dL — ABNORMAL LOW (ref 13.0–17.0)
MCH: 30.7 pg (ref 26.0–34.0)
MCHC: 33.5 g/dL (ref 30.0–36.0)
MCV: 91.7 fL (ref 80.0–100.0)
Platelets: 191 10*3/uL (ref 150–400)
RBC: 3.48 MIL/uL — ABNORMAL LOW (ref 4.22–5.81)
RDW: 13.2 % (ref 11.5–15.5)
WBC: 5.2 10*3/uL (ref 4.0–10.5)
nRBC: 0 % (ref 0.0–0.2)

## 2021-11-29 LAB — RENAL FUNCTION PANEL
Albumin: 2.7 g/dL — ABNORMAL LOW (ref 3.5–5.0)
Anion gap: 5 (ref 5–15)
BUN: 12 mg/dL (ref 8–23)
CO2: 23 mmol/L (ref 22–32)
Calcium: 8 mg/dL — ABNORMAL LOW (ref 8.9–10.3)
Chloride: 108 mmol/L (ref 98–111)
Creatinine, Ser: 0.76 mg/dL (ref 0.61–1.24)
GFR, Estimated: >60 mL/min (ref >60)
Glucose, Bld: 111 mg/dL — ABNORMAL HIGH (ref 70–99)
Phosphorus: 2.8 mg/dL (ref 2.5–4.6)
Potassium: 4.3 mmol/L (ref 3.5–5.1)
Sodium: 136 mmol/L (ref 135–145)

## 2021-11-29 LAB — MAGNESIUM: Magnesium: 1.7 mg/dL (ref 1.7–2.4)

## 2021-11-29 LAB — FOLATE: Folate: 7.9 ng/mL (ref >5.9)

## 2021-11-29 LAB — VITAMIN B12: Vitamin B-12: 564 pg/mL (ref 180–914)

## 2021-11-29 LAB — RETICULOCYTES
Immature Retic Fract: 7.5 % (ref 2.3–15.9)
RBC.: 3.42 MIL/uL — ABNORMAL LOW (ref 4.22–5.81)
Retic Count, Absolute: 28 10*3/uL (ref 19.0–186.0)
Retic Ct Pct: 0.8 % (ref 0.4–3.1)

## 2021-11-29 LAB — FERRITIN: Ferritin: 138 ng/mL (ref 24–336)

## 2021-11-29 MED ORDER — ORAL CARE MOUTH RINSE: 15.0000 mL | OROMUCOSAL | Status: DC | PRN

## 2021-11-29 MED ORDER — OZEMPIC (0.25 OR 0.5 MG/DOSE) 2 MG/1.5ML ~~LOC~~ SOPN: 0.5000 mg | PEN_INJECTOR | SUBCUTANEOUS | 0 refills | Status: DC

## 2021-11-29 MED ORDER — HYDROCHLOROTHIAZIDE 25 MG PO TABS: 25.0000 mg | ORAL_TABLET | Freq: Every day | ORAL | 3 refills | Status: DC

## 2021-11-29 MED ORDER — CIPROFLOXACIN HCL 500 MG PO TABS: 500.0000 mg | ORAL_TABLET | Freq: Two times a day (BID) | ORAL | 0 refills | Status: AC

## 2021-11-29 NOTE — Discharge Summary (Signed)
Physician Discharge Summary  Travis [name] OBS:[id number] DOB: 07-28-1950 DOA: 11/25/2021  PCP: [name], MD  Admit date: 11/25/2021 Discharge date: 11/29/2021 Admitted From: Home Disposition: Home Recommendations for Outpatient Follow-up:  Follow up with PCP as below Follow-up with ENT as previously planned Please obtain CBC and CMP in 1 week Please follow up on the following pending results: None  Home Health: HH PT Equipment/Devices: None  Discharge Condition: Stable CODE STATUS: Full code  Follow-up Information     [name], MD. Schedule an appointment as soon as possible for a visit in 1 week(s).   Specialties: Internal Medicine, Radiology Contact information: [place] [address] [phone]                 Hospital course 71 year old M with PMH of CAD s/p multiple stents, DM-2, OSA on CPAP, HTN, chronic pain syndrome, right BKA, anxiety, depression, Mobitz type I, pancreatic insufficiency, chronic sinusitis s/p sinus surgery on 7/5 presenting with increased low back pain and diarrhea for about 2 days.  He was referred by his primary care physician to neurosurgery and is awaiting on insurance to clear him for surgery "SI fusion".   In ED, febrile to 102.1.  Other vitals within normal.  CBC, CMP and CXR without significant finding.  Head and maxillofacial CT showed severe pansinus paranasal sinus disease.  Lumbar MRI markedly limited study negative for cauda equina impingement or high-grade neural foraminal stenosis.  UA, urine culture, blood culture and CT abdomen and pelvis ordered.  Patient was started on broad-spectrum antibiotics and IV fluid and admitted for sepsis.   The next day, CT abdomen and pelvis concerning for pancolitis, cholelithiasis and sludge.  UA without significant finding.  Blood cultures NGTD.  C. difficile and GIP ordered.  Antibiotics de-escalated to Unasyn and azithromycin.   C. difficile negative.  GIP  positive for EIEC.  Antibiotics changed to Cipro.  Symptoms improved.  He is discharged on p.o. Cipro 500 mg twice daily for 3 more days.  He will continue his Augmentin for his sinusitis.  Advised to hold his HCTZ for 4 days and Ozempic for about a week.  See individual problem list below for more.   Problems addressed during this hospitalization Principal Problem:   Sepsis ([place]) Active Problems:   Low back pain   Hyperlipidemia   Chronic pain syndrome   Essential hypertension   Physical deconditioning   Diabetes mellitus type 2 in obese (HCC)   Nausea and vomiting   Obesity   OSA on CPAP   Controlled diabetes mellitus with hyperglycemia (HCC)   Major depression   S/P unilateral BKA (below knee amputation), right (HCC)   Coronary artery disease   Normocytic anemia   BPH (benign prostatic hyperplasia)   Pancolitis (HCC)   Pancreatic insufficiency   Hypophosphatemia   Colitis due to enteroinvasive Escherichia coli   Sepsis due to pancolitis in the setting of enteroinvasive E. coli infection: POA.  Diarrhea, fever and tachycardia on presentation.  CT shows pancolitis.    Abdominal exam benign.  Blood and urine cultures NGTD.  C. difficile negative.  Pro-Cal elevated to 5.7.  GIP positive for EIEC.  Diarrhea improved.  Sepsis physiology resolved. -Discharged on Cipro 500 mg twice daily to complete a total of 5 days.   Acute on chronic low back pain-no acute finding on lumbar MRI.  Patient states plan for "SI fusion" outpatient.  He has history of chronic pain syndrome.  Improved. -Continue home meds   Chronic sinusitis s/p surgery by ENT: Stable. -Augmentin and ENT follow-up as previously planned -Continue nasal sprays   Nausea and vomiting: Resolved.   Normocytic anemia: Slight drop in Hgb likely dilutional from IV fluid.  Anemia panel consistent with ACD. Recent Labs    03/18/21 0112 03/19/21 0220 04/13/21 1521 06/14/21 0959 11/16/21 1430 11/25/21 1717 11/26/21 0344  11/27/21 0711 11/28/21 0419 11/29/21 0437  HGB 11.1* 10.6* 11.4* 12.3* 12.5* 13.9 13.2 11.2* 10.5* 10.7*  -Recheck CBC in 1 week   BPH with urinary incontinence -Continue home meds   History of CAD s/p multiple stents; no cardiopulmonary symptoms -Continue home meds   Major depression Continue home meds   Controlled NIDDM-2 with hyperglycemia and neuropathy: Seems to be on Ozempic at home.  A1c 5.1%. -Advised to hold Ozempic for about a week   Essential hypertension: SBP slightly elevated to 150s. -Advised to hold HCTZ for 4 days   Hyperlipidemia -Continue statin.   Right BKA/physical deconditioning: Uses prosthesis -HH PT ordered.   Hypokalemia/hypophosphatemia: Likely due to HCT and diarrhea.  Resolved.   Pancreatic insufficiency -Continue Creon.   Obesity Body mass index is 34.71 kg/m. -Continue Ozempic  Vital signs Vitals:   11/28/21 1959 11/28/21 2100 11/29/21 0015 11/29/21 0500  BP: (!) 178/66  (!) 162/63 (!) 150/65  Pulse: (!) 47  (!) 49 (!) 51  Temp: (!) 100.4 F (38 C)  98.5 F (36.9 C) 98.7 F (37.1 C)  Resp: 18 15 17 18   Height:      Weight:      SpO2: 97%  98% 98%  TempSrc: Oral  Oral Oral  BMI (Calculated):         Discharge exam  GENERAL: No apparent distress.  Nontoxic. HEENT: MMM.  Vision and hearing grossly intact.  NECK: Supple.  No apparent JVD.  RESP:  No IWOB.  Fair aeration bilaterally. CVS:  RRR. Heart sounds normal.  ABD/GI/GU: BS+. Abd soft, NTND.  MSK/EXT:  Moves extremities.  Right BKA. SKIN: no apparent skin lesion or wound NEURO: Awake and alert. Oriented appropriately.  No apparent focal neuro deficit. PSYCH: Calm. Normal affect.   Discharge Instructions Discharge Instructions     Call MD for:  extreme fatigue   Complete by: As directed    Call MD for:  persistant dizziness or light-headedness   Complete by: As directed    Call MD for:  persistant nausea and vomiting   Complete by: As directed    Diet - low  sodium heart healthy   Complete by: As directed    Diet Carb Modified   Complete by: As directed    Discharge instructions   Complete by: As directed    It has been a pleasure taking care of you!  You were hospitalized due to diarrhea and lower back pain.  Your diarrhea is likely due to infection for which you have been treated with antibiotic.  Would recommend continuing antibiotics to complete treatment course.  We also recommend not taking your hydrochlorothiazide for the next 4 days or until your diarrhea resolves.  Hold off your Ozempic for 1 week as it could contribute to nausea and vomiting.  You may restart your Augmentin for your sinusitis.  Follow-up with your primary care doctor in 1 to 2 weeks or sooner if needed.  Follow-up with your ENT as previously planned.  Reviewed your new medication list and the directions on your medications before you take them.  Take care,   Increase activity slowly   Complete by: As directed    No wound care   Complete by: As directed       Allergies as of 11/29/2021       Reactions   Buprenorphine Rash        Medication List     STOP taking these medications    predniSONE 10 MG tablet Commonly known as: DELTASONE       TAKE these medications    albuterol 108 (90 Base) MCG/ACT inhaler Commonly known as: VENTOLIN HFA Inhale 2 puffs into the lungs every 6 (six) hours as needed for wheezing or shortness of breath.   amoxicillin-clavulanate 875-125 MG tablet Commonly known as: AUGMENTIN Take 1 tablet by mouth every 12 (twelve) hours.   ARIPiprazole 10 MG tablet Commonly known as: ABILIFY Take 1 tablet (10 mg total) by mouth daily. Notes to patient: 11/29/2021 bedtime    aspirin EC 81 MG tablet Take 81 mg by mouth at bedtime. Swallow whole. Notes to patient: 11/29/2021 bedtime    atorvastatin 20 MG tablet Commonly known as: LIPITOR Take 1 tablet (20 mg total) by mouth daily. Notes to patient: 11/29/2021 bedtime     Azelastine-Fluticasone 137-50 MCG/ACT Susp Use both nostrils as directed twice daily Notes to patient: 11/29/2021 bedtime    Belbuca 600 MCG Film Generic drug: Buprenorphine HCl Place 600 mcg inside cheek in the morning and at bedtime. Notes to patient: 11/29/2021 bedtime    calcium carbonate 500 MG chewable tablet Commonly known as: TUMS - dosed in mg elemental calcium Chew 1 tablet by mouth 3 (three) times daily with meals. Notes to patient: With meals    celecoxib 200 MG capsule Commonly known as: CELEBREX Take 1 capsule (200 mg total) by mouth 2 (two) times daily. Notes to patient: 11/29/2021 bedtime    cetirizine 10 MG tablet Commonly known as: ZYRTEC Take 1 tablet (10 mg total) by mouth daily. Notes to patient: 11/30/2021    ciprofloxacin 500 MG tablet Commonly known as: CIPRO Take 1 tablet (500 mg total) by mouth 2 (two) times daily for 3 days. Notes to patient: 11/29/2021 evening dose    diclofenac Sodium 1 % Gel Commonly known as: VOLTAREN Apply 4 g topically 3 (three) times daily as needed (pain).   donepezil 10 MG tablet Commonly known as: ARICEPT Take 1 tablet (10 mg total) by mouth at bedtime. Notes to patient: 11/29/2021 bedtime    DULoxetine 20 MG capsule Commonly known as: CYMBALTA Take 1 capsule (20 mg total) by mouth 2 (two) times daily. Notes to patient: 11/29/2021 bedtime dose    Fish Oil 1000 MG Caps Take 2,000 mg by mouth daily. Notes to patient: 11/30/2021    gabapentin 800 MG tablet Commonly known as: NEURONTIN Take 800 mg by mouth 3 (three) times daily. Notes to patient: 11/29/2021 2 more doses (had morning dose only)    hydrochlorothiazide 25 MG tablet Commonly known as: HYDRODIURIL Take 1 tablet (25 mg total) by mouth daily. Start taking on: December 03, 2021 What changed: These instructions start on December 03, 2021. If you are unsure what to do until then, ask your doctor or other care provider. Notes to patient: 12/03/2021     HYDROcodone-acetaminophen 10-325 MG tablet Commonly known as: NORCO Take 1 tablet by mouth every 6 (six) hours as needed. Notes to patient: Can take after 1 p.m.    IRON PO Take 1 tablet by mouth daily. Notes to patient: 11/30/2021  losartan 50 MG tablet Commonly known as: COZAAR TAKE ONE TABLET BY MOUTH EVERY DAY Notes to patient: 11/29/2021 bedtime    methocarbamol 500 MG tablet Commonly known as: ROBAXIN TAKE ONE TABLET BY MOUTH EVERY EIGHT HOURS AS NEEDED musle SPASMS What changed: See the new instructions.   Myrbetriq 50 MG Tb24 tablet Generic drug: mirabegron ER Take 50 mg by mouth at bedtime. Notes to patient: 11/29/2021 bedtime    oxymetazoline 0.05 % nasal spray Commonly known as: AFRIN Place 1 spray into both nostrils 2 (two) times daily as needed for congestion.   Ozempic (0.25 or 0.5 MG/DOSE) 2 MG/1.5ML Sopn Generic drug: Semaglutide(0.25 or 0.5MG/DOS) Inject 0.5 mg into the skin once a week. Start taking on: December 06, 2021 What changed: additional instructions Notes to patient: 12/06/2021    prasugrel 5 MG Tabs tablet Commonly known as: EFFIENT Take 1 tablet (5 mg total) by mouth daily. Notes to patient: 11/30/2021    Symbicort 160-4.5 MCG/ACT inhaler Generic drug: budesonide-formoterol Inhale 2 puffs into the lungs 2 (two) times daily. Notes to patient: 11/29/2021 evening    tamsulosin 0.4 MG Caps capsule Commonly known as: FLOMAX Take 0.4 mg by mouth 2 (two) times daily. Notes to patient: 11/29/2021 bedtime    Testosterone 1.62 % Gel Place 1 Pump onto the skin daily. Notes to patient: 11/30/2021    traZODone 100 MG tablet Commonly known as: DESYREL Take 1 tablet (100 mg total) by mouth at bedtime. Notes to patient: 11/29/2021 bedtime    VITAMIN D3 PO Take 2 tablets by mouth at bedtime. Notes to patient: 11/29/2021 bedtime    Zenpep 40000-126000 units Cpep Generic drug: Pancrelipase (Lip-Prot-Amyl) Take 2 capsules (80,000 units) with meals and 1  capsule (40,000 units) with each snack (2-3 snacks/day) Notes to patient: 11/29/2021 with food    zolpidem 10 MG tablet Commonly known as: AMBIEN Take 1 tablet (10 mg total) by mouth at bedtime as needed for sleep. What changed: when to take this Notes to patient: 11/29/2021 bedtime as needed         Consultations: None  Procedures/Studies:   CT ABDOMEN PELVIS W WO CONTRAST  Result Date: 11/26/2021 CLINICAL DATA:  Left lower quadrant pain EXAM: CT ABDOMEN AND PELVIS WITHOUT AND WITH CONTRAST TECHNIQUE: Multidetector CT imaging of the abdomen and pelvis was performed following the standard protocol before and following the bolus administration of intravenous contrast. RADIATION DOSE REDUCTION: This exam was performed according to the departmental dose-optimization program which includes automated exposure control, adjustment of the mA and/or kV according to patient size and/or use of iterative reconstruction technique. CONTRAST:  155m OMNIPAQUE IOHEXOL 300 MG/ML  SOLN COMPARISON:  02/10/2019 FINDINGS: Lower chest: No acute abnormality. Hepatobiliary: There is no focal liver abnormality. Tiny stones/sludge noted layering within the gallbladder. No bile duct dilatation. Pancreas: Unremarkable. No pancreatic ductal dilatation or surrounding inflammatory changes. Spleen: Normal in size without focal abnormality. Adrenals/Urinary Tract: Normal adrenal glands. No suspicious kidney mass, nephrolithiasis or hydronephrosis. Urinary bladder is partially obscured by streak artifact from bilateral hip arthroplasty devices. No focal abnormality noted within this limitation. Stomach/Bowel: Small hiatal hernia. Postop change from sleeve gastrectomy. No pathologic dilatation of large or small bowel loops. The appendix is not confidently identified. There is marked diffuse colonic wall thickening and inflammation identified involving the cecum up to the rectum compatible with pancolitis. No definite signs  pneumatosis. No evidence for bowel perforation or bowel obstruction.Liquid stool is identified within the left colon, sigmoid colon and rectum. Vascular/Lymphatic: Aortic  atherosclerosis without aneurysm. The upper abdominal vascularity appears patent. No signs of abdominopelvic adenopathy. Reproductive: Prostate gland is obscured by streak artifact from patient's hip arthroplasty devices. Other: Trace free fluid noted along the right pericolic gutter. No focal fluid collections. No signs of pneumoperitoneum Musculoskeletal: Status post bilateral hip arthroplasty. Previous lumbar laminectomy and posterior hardware fixation. No acute or suspicious osseous findings. IMPRESSION: 1. Marked diffuse colonic wall thickening and inflammation involving the cecum up to the rectum compatible with pancolitis. No evidence for bowel perforation or bowel obstruction. 2. Small hiatal hernia. 3. Gallstones/sludge. 4. Aortic Atherosclerosis (ICD10-I70.0). Electronically Signed   By: Kerby Moors M.D.   On: 11/26/2021 08:00   MR Lumbar Spine W Wo Contrast  Result Date: 11/26/2021 CLINICAL DATA:  Low back pain EXAM: MRI LUMBAR SPINE WITHOUT AND WITH CONTRAST TECHNIQUE: Multiplanar and multiecho pulse sequences of the lumbar spine were obtained without and with intravenous contrast. CONTRAST:  19m GADAVIST GADOBUTROL 1 MMOL/ML IV SOLN COMPARISON:  02/19/2020 FINDINGS: The examination is degraded by susceptibility effects caused by spinal hardware. Segmentation: Standard. Alignment:  Grade 1 retrolisthesis at L1-2 and L2-3 Vertebrae: L1-S1 posterior instrumented fusion. No acute abnormality. Conus medullaris and cauda equina: Conus extends to the L1 level. Conus and cauda equina appear normal. Paraspinal and other soft tissues: Negative Disc levels: Limited assessment of disc levels due to susceptibility effects. Within that limitation, the spinal canal is widely patent and there is no evidence of cauda equina impingement. There is  no high-grade neural foraminal stenosis. IMPRESSION: 1. Markedly limited study due to susceptibility effects caused by spinal hardware. Within that limitation, there is no evidence of cauda equina impingement or high-grade neural foraminal stenosis. 2. Grade 1 retrolisthesis at L1-2 and L2-3. Electronically Signed   By: KUlyses JarredM.D.   On: 11/26/2021 01:36   CT Head Wo Contrast  Result Date: 11/25/2021 CLINICAL DATA:  Mental status change, unknown cause; Mastication paralysis/weakness (CN 5) EXAM: CT HEAD WITHOUT CONTRAST CT MAXILLOFACIAL WITHOUT CONTRAST TECHNIQUE: Multidetector CT imaging of the head and maxillofacial structures were performed using the standard protocol without intravenous contrast. Multiplanar CT image reconstructions of the maxillofacial structures were also generated. RADIATION DOSE REDUCTION: This exam was performed according to the departmental dose-optimization program which includes automated exposure control, adjustment of the mA and/or kV according to patient size and/or use of iterative reconstruction technique. COMPARISON:  None Available. FINDINGS: CT HEAD FINDINGS Brain: No evidence of acute infarction, hemorrhage, hydrocephalus, extra-axial collection or mass lesion/mass effect. Vascular: No hyperdense vessel identified. Skull: No acute fracture. Other: No mastoid effusions. CT MAXILLOFACIAL FINDINGS Motion limited. Osseous: No fracture or mandibular dislocation. No destructive process. Orbits: Negative. No traumatic or inflammatory finding. Sinuses: Severe paranasal sinus disease including and sinus mucosal thickening with air-fluid levels and areas of internal hyperdensity that could represent inspissated secretions and/or fungal colonization. The nasal cavity is partially opacified. Soft tissues: Motion limited without obvious abnormality. IMPRESSION: 1. No evidence of acute intracranial abnormality. 2. Severe, pansinus paranasal sinus disease, described above. 3. No  evidence of acute facial fracture on the motion limited maxillofacial CT. Electronically Signed   By: FMargaretha SheffieldM.D.   On: 11/25/2021 19:28   CT Maxillofacial W Contrast  Result Date: 11/25/2021 CLINICAL DATA:  Mental status change, unknown cause; Mastication paralysis/weakness (CN 5) EXAM: CT HEAD WITHOUT CONTRAST CT MAXILLOFACIAL WITHOUT CONTRAST TECHNIQUE: Multidetector CT imaging of the head and maxillofacial structures were performed using the standard protocol without intravenous contrast. Multiplanar CT image reconstructions  of the maxillofacial structures were also generated. RADIATION DOSE REDUCTION: This exam was performed according to the departmental dose-optimization program which includes automated exposure control, adjustment of the mA and/or kV according to patient size and/or use of iterative reconstruction technique. COMPARISON:  None Available. FINDINGS: CT HEAD FINDINGS Brain: No evidence of acute infarction, hemorrhage, hydrocephalus, extra-axial collection or mass lesion/mass effect. Vascular: No hyperdense vessel identified. Skull: No acute fracture. Other: No mastoid effusions. CT MAXILLOFACIAL FINDINGS Motion limited. Osseous: No fracture or mandibular dislocation. No destructive process. Orbits: Negative. No traumatic or inflammatory finding. Sinuses: Severe paranasal sinus disease including and sinus mucosal thickening with air-fluid levels and areas of internal hyperdensity that could represent inspissated secretions and/or fungal colonization. The nasal cavity is partially opacified. Soft tissues: Motion limited without obvious abnormality. IMPRESSION: 1. No evidence of acute intracranial abnormality. 2. Severe, pansinus paranasal sinus disease, described above. 3. No evidence of acute facial fracture on the motion limited maxillofacial CT. Electronically Signed   By: Margaretha Sheffield M.D.   On: 11/25/2021 19:28   DG Chest Portable 1 View  Result Date: 11/25/2021 CLINICAL  DATA:  Increasing back pain, cough EXAM: PORTABLE CHEST 1 VIEW COMPARISON:  12/31/2020 FINDINGS: 2 frontal views of the chest demonstrate an unremarkable cardiac silhouette. No acute airspace disease, effusion, or pneumothorax. Postsurgical changes within the left shoulder and lumbar spine. No acute bony abnormality. IMPRESSION: 1. No acute intrathoracic process. Electronically Signed   By: Randa Ngo M.D.   On: 11/25/2021 18:21       The results of significant diagnostics from this hospitalization (including imaging, microbiology, ancillary and laboratory) are listed below for reference.     Microbiology: Recent Results (from the past 240 hour(s))  Blood culture (routine x 2)     Status: None (Preliminary result)   Collection Time: 11/25/21  7:27 PM   Specimen: BLOOD  Result Value Ref Range Status   Specimen Description   Final    BLOOD SITE NOT SPECIFIED Performed at [facility] [address] [place], [place] [address]    Special Requests   Final    BOTTLES DRAWN AEROBIC AND ANAEROBIC Blood Culture adequate volume Performed at [facility] [address] [place], [place] [address]    Culture   Final    NO GROWTH 4 DAYS Performed at [facility], [place] [address] [place], [place] [address]    Report Status PENDING  Incomplete  Blood culture (routine x 2)     Status: None (Preliminary result)   Collection Time: 11/26/21  3:44 AM   Specimen: BLOOD  Result Value Ref Range Status   Specimen Description   Final    BLOOD BLOOD RIGHT HAND Performed at [facility] [address], [place], [place] [address]    Special Requests   Final    BOTTLES DRAWN AEROBIC AND ANAEROBIC Blood Culture adequate volume Performed at [facility] [address], [place], [place] [address]    Culture   Final    NO GROWTH 3 DAYS Performed at [facility], [place] [address] [place], [place] [address]     Report Status PENDING  Incomplete  Urine Culture     Status: None   Collection Time: 11/26/21  6:06 AM   Specimen: Urine, Catheterized  Result Value Ref Range Status   Specimen Description   Final    URINE, CATHETERIZED Performed at [address]  [place], [place] [address]    Special Requests   Final    NONE Performed at [facility], [place] [address], [place], [place] [address]    Culture   Final    NO GROWTH Performed at [facility], [place] [address] [place], [place] [address]    Report Status 11/27/2021 FINAL  Final  Gastrointestinal Panel by PCR , Stool     Status: Abnormal   Collection Time: 11/26/21  8:43 AM   Specimen: STOOL  Result Value Ref Range Status   Campylobacter species NOT DETECTED NOT DETECTED Final   Plesimonas shigelloides NOT DETECTED NOT DETECTED Final   Salmonella species NOT DETECTED NOT DETECTED Final   Yersinia enterocolitica NOT DETECTED NOT DETECTED Final   Vibrio species NOT DETECTED NOT DETECTED Final   Vibrio cholerae NOT DETECTED NOT DETECTED Final   Enteroaggregative E coli (EAEC) NOT DETECTED NOT DETECTED Final   Enteropathogenic E coli (EPEC) NOT DETECTED NOT DETECTED Final   Enterotoxigenic E coli (ETEC) NOT DETECTED NOT DETECTED Final   Shiga like toxin producing E coli (STEC) NOT DETECTED NOT DETECTED Final   Shigella/Enteroinvasive E coli (EIEC) DETECTED (A) NOT DETECTED Final    Comment: RESULT CALLED TO, READ BACK BY AND VERIFIED WITH: SAKARA MAILELE AT 1643 11/27/21.PMF    Cryptosporidium NOT DETECTED NOT DETECTED Final   Cyclospora cayetanensis NOT DETECTED NOT DETECTED Final   Entamoeba histolytica NOT DETECTED NOT DETECTED Final   Giardia lamblia NOT DETECTED NOT DETECTED Final   Adenovirus F40/41 NOT DETECTED NOT DETECTED Final   Astrovirus NOT DETECTED NOT DETECTED Final   Norovirus GI/GII NOT DETECTED NOT DETECTED Final   Rotavirus A NOT DETECTED NOT DETECTED Final    Sapovirus (I, II, IV, and V) NOT DETECTED NOT DETECTED Final    Comment: Performed at [facility], [place], [place] [address]  C Difficile Quick Screen w PCR reflex     Status: None   Collection Time: 11/26/21 10:55 AM   Specimen: STOOL  Result Value Ref Range Status   C Diff antigen NEGATIVE NEGATIVE Final   C Diff toxin NEGATIVE NEGATIVE Final   C Diff interpretation No C. difficile detected.  Final    Comment: Performed at [facility], [place] [address], [place],  [address]     Labs:  CBC: Recent Labs  Lab 11/25/21 1717 11/26/21 0344 11/27/21 0711 11/28/21 0419 11/29/21 0437  WBC 7.1 7.2 5.1 4.5 5.2  NEUTROABS 5.9  --   --   --   --   HGB 13.9 13.2 11.2* 10.5* 10.7*  HCT 41.4 39.5 33.2* 32.0* 31.9*  MCV 92.6 93.2 93.0 92.2 91.7  PLT 164 163 165 180 191   BMP &GFR Recent Labs  Lab 11/25/21 1717 11/26/21 0344 11/26/21 1313 11/27/21 0711 11/28/21 0419 11/29/21 0437  NA 137 136  --  140 141 136  K 3.6 3.2*  --  3.2* 4.3 4.3  CL 102 105  --  108 114* 108  CO2 24 20*  --  26 22 23   GLUCOSE 147* 182*  --  114* 112* 111*  BUN 20 21  --  20 16 12   CREATININE 0.93 0.97  --  0.84 0.80 0.76  CALCIUM 8.4* 7.8*  --  8.1* 8.3* 8.0*  MG  --   --  1.8 1.9 1.7 1.7  PHOS  --   --   --  2.0* 2.8 2.8   Estimated Creatinine  Clearance: 114.6 mL/min (by C-G formula based on SCr of 0.76 mg/dL). Liver & Pancreas: Recent Labs  Lab 11/25/21 1717 11/27/21 0711 11/28/21 0419 11/29/21 0437  AST 21  --   --   --   ALT 12  --   --   --   ALKPHOS 59  --   --   --   BILITOT 0.9  --   --   --   PROT 6.7  --   --   --   ALBUMIN 3.7 2.8* 2.7* 2.7*   No results for input(s): "LIPASE", "AMYLASE" in the last 168 hours. No results for input(s): "AMMONIA" in the last 168 hours. Diabetic: Recent Labs    11/27/21 0711  HGBA1C 5.1   Recent Labs  Lab 11/26/21 1638 11/26/21 2140 11/27/21 0804 11/27/21 1111 11/27/21 2200  GLUCAP 139*  107* 118* 135* 119*   Cardiac Enzymes: Recent Labs  Lab 11/27/21 0711  CKTOTAL 67   No results for input(s): "PROBNP" in the last 8760 hours. Coagulation Profile: Recent Labs  Lab 11/26/21 0534  INR 1.4*   Thyroid Function Tests: No results for input(s): "TSH", "T4TOTAL", "FREET4", "T3FREE", "THYROIDAB" in the last 72 hours. Lipid Profile: No results for input(s): "CHOL", "HDL", "LDLCALC", "TRIG", "CHOLHDL", "LDLDIRECT" in the last 72 hours. Anemia Panel: Recent Labs    11/29/21 0437  VITAMINB12 564  FOLATE 7.9  FERRITIN 138  TIBC 192*  IRON 55  RETICCTPCT 0.8   Urine analysis:    Component Value Date/Time   COLORURINE YELLOW 11/26/2021 0344   APPEARANCEUR CLEAR 11/26/2021 0344   LABSPEC 1.038 (H) 11/26/2021 0344   PHURINE 5.0 11/26/2021 0344   GLUCOSEU NEGATIVE 11/26/2021 0344   GLUCOSEU NEGATIVE 06/14/2021 0959   HGBUR SMALL (A) 11/26/2021 0344   HGBUR negative 04/19/2010 1014   BILIRUBINUR NEGATIVE 11/26/2021 0344   KETONESUR 5 (A) 11/26/2021 0344   PROTEINUR 30 (A) 11/26/2021 0344   UROBILINOGEN 0.2 06/14/2021 0959   NITRITE NEGATIVE 11/26/2021 0344   LEUKOCYTESUR NEGATIVE 11/26/2021 0344   Sepsis Labs: Invalid input(s): "PROCALCITONIN", "LACTICIDVEN"   SIGNED:  Mercy Riding, MD  [facility] 11/29/2021, 6:13 PM

## 2021-11-29 NOTE — Care Management Important Message (Signed)
Important Message  Patient Details IM Letter given to the Patient. Name: Travis FRANCOM Sr. MRN: [id number] Date of Birth: 01-Jun-1950   Medicare Important Message Given:  Yes     Kerin Salen 11/29/2021, 10:29 AM

## 2021-11-29 NOTE — TOC Transition Note (Signed)
Transition of Care [facility]) - CM/SW Discharge Note   Patient Details  Name: Travis [name] Sr. MRN: [id number] Date of Birth: [birth year]  Transition of Care [facility]) CM/SW Contact:  Leeroy Cha, RN Phone Number: 11/29/2021, 9:32 AM   Clinical Narrative:    Dcd to home with no toc needs.   Final next level of care: Home/Self Care Barriers to Discharge: Barriers Resolved   Patient Goals and CMS Choice Patient states their goals for this hospitalization and ongoing recovery are:: to go home CMS Medicare.gov Compare Post Acute Care list provided to:: Patient    Discharge Placement                       Discharge Plan and Services   Discharge Planning Services: CM Consult                                 Social Determinants of Health (SDOH) Interventions     Readmission Risk Interventions     No data to display

## 2021-11-29 NOTE — Consult Note (Signed)
   [facility] CM Inpatient Consult   11/29/2021  [place] Jan 22, 1951 [id number]  [facility] [ACO] Patient: [employer]  *Remote review for high risk scores for unplanned readmission with less than 7 days readmission - patient at [place] Provider:  [name], MD, [place] [facility] at [facility], is an embedded provider with a Chronic Care Management team and program, and is listed for the transition of care follow up and appointments.  Patient was screened for Embedded practice service needs for chronic care management/care coordination for recent readmission.  Patient has been followed previously from the Embedded pharmacist noted. 1420 Spoke with patient via phone, HIPAA verified with 2 identifiers.  Explained the nature of outreach and that his provider can assist with care coordination with a nurse, pharmacist and potentially social worker.  Patient states, "I don't feel I need anything extra right now.  I do have to make an appointment for follow up."  Encouraged him to let his provider office know if he has follow up needs.  Patient verbalized understand and thanked this Probation officer for the out reach call.  Plan: Patient transitioned home, patient states no additional needs.  Please contact for further questions,  [name], RN BSN [facility] Liaison  [phone] business mobile phone Toll free office [phone]  Fax number: [phone] [email or web address]

## 2021-11-29 NOTE — Plan of Care (Signed)
Discharge instructions reviewed with patient, questions answered, verbalized understanding.  Patient transported to main entrance to be taken home by friend.

## 2021-11-30 ENCOUNTER — Telehealth: Payer: Self-pay

## 2021-11-30 LAB — CULTURE, BLOOD (ROUTINE X 2)
Culture: NO GROWTH
Special Requests: ADEQUATE

## 2021-11-30 NOTE — Telephone Encounter (Signed)
Transition Care Management Unsuccessful Follow-up Telephone Call  Date of discharge and from where:  [facility] on 11/25/2021   Dx: A41.9 Sepsis  Attempts:  1st Attempt  Reason for unsuccessful TCM follow-up call:  Left voice message

## 2021-12-01 LAB — CULTURE, BLOOD (ROUTINE X 2)
Culture: NO GROWTH
Special Requests: ADEQUATE

## 2021-12-04 DIAGNOSIS — I10 Essential (primary) hypertension: Secondary | ICD-10-CM | POA: Diagnosis not present

## 2021-12-04 DIAGNOSIS — W19XXXA Unspecified fall, initial encounter: Secondary | ICD-10-CM | POA: Diagnosis not present

## 2021-12-04 DIAGNOSIS — R569 Unspecified convulsions: Secondary | ICD-10-CM | POA: Diagnosis not present

## 2021-12-12 ENCOUNTER — Telehealth (INDEPENDENT_AMBULATORY_CARE_PROVIDER_SITE_OTHER): Payer: Medicare Other | Admitting: Psychology

## 2021-12-12 ENCOUNTER — Encounter: Payer: Self-pay | Admitting: Internal Medicine

## 2021-12-12 ENCOUNTER — Encounter (INDEPENDENT_AMBULATORY_CARE_PROVIDER_SITE_OTHER): Payer: Self-pay | Admitting: Family Medicine

## 2021-12-12 ENCOUNTER — Ambulatory Visit (INDEPENDENT_AMBULATORY_CARE_PROVIDER_SITE_OTHER): Payer: Medicare Other | Admitting: Internal Medicine

## 2021-12-12 ENCOUNTER — Ambulatory Visit (INDEPENDENT_AMBULATORY_CARE_PROVIDER_SITE_OTHER): Payer: Medicare Other | Admitting: Family Medicine

## 2021-12-12 VITALS — BP 156/70 | HR 57 | Temp 98.1°F | Ht 72.0 in | Wt 255.0 lb

## 2021-12-12 DIAGNOSIS — E1169 Type 2 diabetes mellitus with other specified complication: Secondary | ICD-10-CM

## 2021-12-12 DIAGNOSIS — E559 Vitamin D deficiency, unspecified: Secondary | ICD-10-CM

## 2021-12-12 DIAGNOSIS — A042 Enteroinvasive Escherichia coli infection: Secondary | ICD-10-CM | POA: Diagnosis not present

## 2021-12-12 DIAGNOSIS — E669 Obesity, unspecified: Secondary | ICD-10-CM

## 2021-12-12 DIAGNOSIS — D649 Anemia, unspecified: Secondary | ICD-10-CM
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
## 2021-12-13 ENCOUNTER — Encounter (INDEPENDENT_AMBULATORY_CARE_PROVIDER_SITE_OTHER): Payer: Self-pay

## 2021-12-13 ENCOUNTER — Telehealth (INDEPENDENT_AMBULATORY_CARE_PROVIDER_SITE_OTHER): Payer: Medicare Other | Admitting: Family Medicine

## 2021-12-13 VITALS — BP 144/74 | Ht 70.0 in | Wt 249.0 lb

## 2021-12-13 DIAGNOSIS — Z7985 Long-term (current) use of injectable non-insulin antidiabetic drugs: Secondary | ICD-10-CM | POA: Diagnosis not present

## 2021-12-13 DIAGNOSIS — E1159 Type 2 diabetes mellitus with other circulatory complications: Secondary | ICD-10-CM | POA: Diagnosis not present

## 2021-12-13 DIAGNOSIS — E1165 Type 2 diabetes mellitus with hyperglycemia: Secondary | ICD-10-CM | POA: Diagnosis not present

## 2021-12-13 DIAGNOSIS — I152 Hypertension secondary to endocrine disorders: Secondary | ICD-10-CM | POA: Diagnosis not present

## 2021-12-13 DIAGNOSIS — E669 Obesity, unspecified: Secondary | ICD-10-CM

## 2021-12-13 DIAGNOSIS — Z6835 Body mass index (BMI) 35.0-35.9, adult: Secondary | ICD-10-CM

## 2021-12-13 MED ORDER — OZEMPIC (0.25 OR 0.5 MG/DOSE) 2 MG/1.5ML ~~LOC~~ SOPN: 0.5000 mg | PEN_INJECTOR | SUBCUTANEOUS | 0 refills | Status: DC

## 2021-12-14 ENCOUNTER — Encounter: Payer: Self-pay | Admitting: Internal Medicine

## 2021-12-14 NOTE — Assessment & Plan Note (Signed)
Clinically resolved, ok to continue to follow for any recurrent symptoms

## 2021-12-14 NOTE — Patient Instructions (Signed)
Please continue all other medications as before, and refills have been done if requested.  Please have the pharmacy call with any other refills you may need.  Please keep your appointments with your specialists as you may have planned

## 2021-12-14 NOTE — Assessment & Plan Note (Signed)
Last vitamin D Lab Results  Component Value Date   VD25OH 40.99 06/14/2021   Stable, cont oral replacement

## 2021-12-14 NOTE — Assessment & Plan Note (Signed)
Lab Results  Component Value Date   WBC 5.2 11/29/2021   HGB 10.7 (L) 11/29/2021   HCT 31.9 (L) 11/29/2021   MCV 91.7 11/29/2021   PLT 191 11/29/2021  mild recent worsening uncontrolled, cant r/o iron deficiency, pt declines f/u lab today, as he suspect will be done prior to coming surgury preop soon

## 2021-12-14 NOTE — Assessment & Plan Note (Signed)
Lab Results  Component Value Date   HGBA1C 5.1 11/27/2021   Stable, pt to continue current medical treatment ozempic 0.5 qweekly

## 2021-12-15 NOTE — Progress Notes (Signed)
TeleHealth Visit:  Due to the COVID-19 pandemic, this visit was completed with telemedicine (audio/video) technology to reduce patient and provider exposure as well as to preserve personal protective equipment.   Travis [name] has verbally consented to this TeleHealth visit. The patient is located at home, the provider is located at the [facility] office. The participants in this visit include the listed provider and patient. The visit was conducted today via MyChart Video.  Chief Complaint: OBESITY Travis [name] is here to discuss his progress with his obesity treatment plan along with follow-up of his obesity related diagnoses. Travis [name] is on the Category 4 Plan and keeping a food journal and adhering to recommended goals of 450-600 calories and 40+ grams of protein and states he is following his eating plan approximately 50% of the time. Travis [name] states he is using weights 45 minutes 3 times per week.  Today's visit was #: 11 Starting weight: 302 lbs Starting date: 11/14/2021  Interim History: Travis [name] was recently hospitalized with E.coli pancolitis. This hospitalization was a few days after his sinus surgery. He was not able to control food choices and had a decrease in appetite when sick. Thinks same plan will work for the next few weeks. Back surgery still in the works. He has been focusing on protein.  Subjective:   1. Type 2 diabetes mellitus with hyperglycemia, without long-term current use of insulin (HCC) Travis [name] most recent A1c 5.1!!! Significant improvement from 5.8 nine months ago on Ozempic without GI side effects noted.  2. Hypertension associated with type 2 diabetes mellitus (Travis [name]) Travis [name]'s blood pressure elevated yesterday. Denies chest pain, chest pressure and headache. Travis [name] stopped taking HCTZ and he is currently taking Cozaar.   Assessment/Plan:   1. Type 2 diabetes mellitus with hyperglycemia, without long-term current use of insulin (HCC) We will refill  Ozempic 0.5 mg SubQ once weekly for 1 month with 0 refills.  -Refill Semaglutide,0.25 or 0.5MG/DOS, (OZEMPIC, 0.25 OR 0.5 MG/DOSE,) 2 MG/1.5ML SOPN; Inject 0.5 mg into the skin once a week.  Dispense: 3 mL; Refill: 0  2. Hypertension associated with type 2 diabetes mellitus (Travis [name]) Will follow up blood pressure at next appointment.  3. Obesity with current BMI of 35.7 Travis [name] is currently in the action stage of change. As such, his goal is to continue with weight loss efforts. He has agreed to the Category 4 Plan and keeping a food journal and adhering to recommended goals of 450-600 calories and 40+ grams of protein for supper.  Exercise goals: All adults should avoid inactivity. Some physical activity is better than none, and adults who participate in any amount of physical activity gain some health benefits.  Behavioral modification strategies: increasing lean protein intake, meal planning and cooking strategies, keeping healthy foods in the home, and planning for success.  Travis [name] has agreed to follow-up with our clinic in 4 weeks. He was informed of the importance of frequent follow-up visits to maximize his success with intensive lifestyle modifications for his multiple health conditions.  Objective:   VITALS: Per patient if applicable, see vitals. GENERAL: Alert and in no acute distress. CARDIOPULMONARY: No increased WOB. Speaking in clear sentences.  PSYCH: Pleasant and cooperative. Speech normal rate and rhythm. Affect is appropriate. Insight and judgement are appropriate. Attention is focused, linear, and appropriate.  NEURO: Oriented as arrived to appointment on time with no prompting.   Lab Results  Component Value Date   CREATININE 0.76 11/29/2021   BUN 12 11/29/2021   NA 136  11/29/2021   K 4.3 11/29/2021   CL 108 11/29/2021   CO2 23 11/29/2021   Lab Results  Component Value Date   ALT 12 11/25/2021   AST 21 11/25/2021   ALKPHOS 59 11/25/2021   BILITOT 0.9 11/25/2021    Lab Results  Component Value Date   HGBA1C 5.1 11/27/2021   HGBA1C 5.5 06/14/2021   HGBA1C 5.6 04/11/2021   HGBA1C 5.8 (H) 03/09/2021   HGBA1C 6.1 12/01/2020   Lab Results  Component Value Date   INSULIN 12.2 04/11/2021   Lab Results  Component Value Date   TSH 2.63 06/14/2021   Lab Results  Component Value Date   CHOL 129 06/14/2021   HDL 48.10 06/14/2021   LDLCALC 60 06/14/2021   TRIG 104.0 06/14/2021   CHOLHDL 3 06/14/2021   Lab Results  Component Value Date   VD25OH 40.99 06/14/2021   VD25OH 51.0 04/11/2021   VD25OH 53.07 12/01/2020   Lab Results  Component Value Date   WBC 5.2 11/29/2021   HGB 10.7 (L) 11/29/2021   HCT 31.9 (L) 11/29/2021   MCV 91.7 11/29/2021   PLT 191 11/29/2021   Lab Results  Component Value Date   IRON 55 11/29/2021   TIBC 192 (L) 11/29/2021   FERRITIN 138 11/29/2021    Attestation Statements:   Reviewed by clinician on day of visit: allergies, medications, problem list, medical history, surgical history, family history, social history, and previous encounter notes.  I, Elnora Morrison, RMA am acting as transcriptionist for Coralie Common, MD.  I have reviewed the above documentation for accuracy and completeness, and I agree with the above. - Coralie Common, MD
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
## 2021-12-19 ENCOUNTER — Encounter: Payer: Self-pay | Admitting: Cardiovascular Disease

## 2021-12-19 ENCOUNTER — Ambulatory Visit (INDEPENDENT_AMBULATORY_CARE_PROVIDER_SITE_OTHER): Payer: Medicare Other | Admitting: Cardiovascular Disease

## 2021-12-19 VITALS — BP 120/74 | HR 63 | Ht 70.0 in | Wt 255.6 lb

## 2021-12-19 DIAGNOSIS — I10 Essential (primary) hypertension: Secondary | ICD-10-CM

## 2021-12-19 DIAGNOSIS — I2510 Atherosclerotic heart disease of native coronary artery without angina pectoris: Secondary | ICD-10-CM | POA: Diagnosis not present

## 2021-12-19 NOTE — Patient Instructions (Signed)
Medication Instructions:  No changes *If you need a refill on your cardiac medications before your next appointment, please call your pharmacy*   Lab Work: none If you have labs (blood work) drawn today and your tests are completely normal, you will receive your results only by: [place] (if you have MyChart) OR A paper copy in the mail If you have any lab test that is abnormal or we need to change your treatment, we will call you to review the results.   Testing/Procedures: none   Follow-Up: At [facility], you and your health needs are our priority.  As part of our continuing mission to provide you with exceptional heart care, we have created designated Provider Care Teams.  These Care Teams include your primary Cardiologist (physician) and Advanced Practice Providers (APPs -  Physician Assistants and Nurse Practitioners) who all work together to provide you with the care you need, when you need it.   Your next appointment:   12 month(s)  The format for your next appointment:   In Person  Provider:   Lauree Chandler, MD     Other Instructions   Important Information About Sugar

## 2021-12-23 ENCOUNTER — Other Ambulatory Visit: Payer: Self-pay | Admitting: Internal Medicine

## 2021-12-27 ENCOUNTER — Telehealth (INDEPENDENT_AMBULATORY_CARE_PROVIDER_SITE_OTHER): Payer: Self-pay | Admitting: Psychology

## 2021-12-27 NOTE — Telephone Encounter (Signed)
  Office: [phone]  /  Fax: [phone]  Date of Call: December 27, 2021  Time of Call: 9:43am Duration of Call: ~5.5 minute(s) Provider: Glennie Isle, PsyD  CONTENT: This provider called [name] to check-in and schedule a follow-up appointment. He expressed desire to reschedule with this provider as he cannot "control [his] appetite" after dinner. This provider discussed the importance of protein intake and he acknowledged he has been eating more carbs and less protein. He was receptive to checking-in during each meal with himself regarding the amount of protein on his plate to increase intake. He added, "Everything else is good." Thomson also shared his surgery was rescheduled. A brief risk assessment was completed. Bertram denied experiencing suicidal, self-harm and homicidal ideation, plan, or intent since the last appointment with this provider. All questions/concerns addressed.   PLAN: Shamarr is scheduled for an appointment on 01/16/2022 at 11am via [place] Visit.

## 2021-12-28 ENCOUNTER — Encounter (HOSPITAL_COMMUNITY): Payer: Self-pay | Admitting: Psychiatry

## 2021-12-28 ENCOUNTER — Encounter (INDEPENDENT_AMBULATORY_CARE_PROVIDER_SITE_OTHER): Payer: Self-pay

## 2021-12-28 ENCOUNTER — Other Ambulatory Visit: Payer: Self-pay | Admitting: Internal Medicine

## 2021-12-28 ENCOUNTER — Telehealth (HOSPITAL_BASED_OUTPATIENT_CLINIC_OR_DEPARTMENT_OTHER): Payer: Medicare Other | Admitting: Psychiatry

## 2021-12-28 DIAGNOSIS — F419 Anxiety disorder, unspecified: Secondary | ICD-10-CM

## 2021-12-28 DIAGNOSIS — F319 Bipolar disorder, unspecified: Secondary | ICD-10-CM

## 2021-12-28 MED ORDER — DULOXETINE HCL 20 MG PO CPEP: 20.0000 mg | ORAL_CAPSULE | Freq: Two times a day (BID) | ORAL | 0 refills | Status: DC

## 2021-12-28 MED ORDER — ARIPIPRAZOLE 10 MG PO TABS
10.0000 mg | ORAL_TABLET | Freq: Every day | ORAL | 0 refills | Status: DC
Start: 2021-12-28 — End: 2022-03-29

## 2021-12-28 MED ORDER — TRAZODONE HCL 150 MG PO TABS: 150.0000 mg | ORAL_TABLET | Freq: Every day | ORAL | 0 refills | Status: DC

## 2021-12-28 NOTE — Progress Notes (Signed)
Virtual Visit via Video Note  I connected with Travis Novak Sr. on 12/28/21 at 10:20 AM EDT by a video enabled telemedicine application and verified that I am speaking with the correct person using two identifiers.  Location: Patient: Home Provider: Home Office   I discussed the limitations of evaluation and management by telemedicine and the availability of in person appointments. The patient expressed understanding and agreed to proceed.  History of Present Illness: Patient is evaluated by video session.  He recently admitted on the medical floor because of infection.  He finished his antibiotic and doing better.  Patient has lost a lot of weight since he is on Ozempic.  He is feeling good.  Stability and gradually his energy coming back after the hospitalization.  He is excited about going to [place] with his brother and wife to watch [employer] football game.  He denies any mania, psychosis, hallucination.  He denies any anger or any impulsive behavior.  He admitted sleep is not good since trazodone cut down.  He only sleeps 2 to 3 hours and having racing thoughts.  He started going to gym and feels good about it.  He is having back surgery which he described fusion on September 18 to help his back pain.  He is looking forward to have the surgery because he is in a lot of pain.  He is taking pain medicine, gabapentin, Ambien along with Abilify, trazodone and Cymbalta.  He has no tremors, shakes or any EPS.  He denies any hallucination.   Past Psychiatric History:  Saw psychiatrist in [place] and [place].  H/O mood swings, suicidal gesture, anger, hallucination, irritability and increase spending. Married 5 times. H/O inpatient in 2014 due to overdose on Xanax and alcohol.  Did IOP. Took Effexor, Xanax, Klonopin, Ambien, Lunesta, Wellbutrin, Ambien, Remeron and Prozac. Abilify started in our office.    Psychiatric Specialty Exam: Physical Exam  Review of Systems  Weight 255 lb  (115.7 kg).There is no height or weight on file to calculate BMI.  General Appearance: Casual  Eye Contact:  Good  Speech:  Slow  Volume:  Normal  Mood:  Euthymic  Affect:  Congruent  Thought Process:  Descriptions of Associations: Intact  Orientation:  Full (Time, Place, and Person)  Thought Content:  Logical  Suicidal Thoughts:  No  Homicidal Thoughts:  No  Memory:  Immediate;   Good Recent;   Fair Remote;   Fair  Judgement:  Intact  Insight:  Present  Psychomotor Activity:  Normal  Concentration:  Concentration: Fair and Attention Span: Fair  Recall:  [employer] of Knowledge:  Fair  Language:  Good  Akathisia:  No  Handed:  Right  AIMS (if indicated):     Assets:  Communication Skills Desire for Improvement Housing  ADL's:  Intact  Cognition:  WNL  Sleep:   3-4 hrs      Assessment and Plan: Bipolar disorder type I.  Anxiety.  I reviewed blood work results and current medication.  Patient was admitted in the hospital for infection but now feeling better.  He would like to go back on trazodone 150 because he is not sleeping.  We discussed polypharmacy but patient is taking these medication for a while.  He used to take trazodone 150 but since dose reduced he has trouble sleeping.  Will go back to trazodone original dose 150 at bedtime.  Continue Cymbalta 20 mg twice a day and Abilify 10 mg daily.  Patient excited  about going to [employer] gain on November 3.  He liked to schedule appointment after his visit.  Patient is also having fusion procedure for his back on September 18.  Reassurance given.  Recommended to call us back if is any question or any concern.  Follow-up in 3 months.  Follow Up Instructions:    I discussed the assessment and treatment plan with the patient. The patient was provided an opportunity to ask questions and all were answered. The patient agreed with the plan and demonstrated an understanding of the instructions.   The patient was advised  to call back or seek an in-person evaluation if the symptoms worsen or if the condition fails to improve as anticipated.  Collaboration of Care: Other provider involved in patient's care AEB notes are available in epic to review.  Patient/Guardian was advised Release of Information must be obtained prior to any record release in order to collaborate their care with an outside provider. Patient/Guardian was advised if they have not already done so to contact the registration department to sign all necessary forms in order for [place] to release information regarding their care.   Consent: Patient/Guardian gives verbal consent for treatment and assignment of benefits for services provided during this visit. Patient/Guardian expressed understanding and agreed to proceed.    I provided 19 minutes of non-face-to-face time during this encounter.   Kathlee Nations, MD

## 2021-12-29 DIAGNOSIS — J9620 Acute and chronic respiratory failure, unspecified whether with hypoxia or hypercapnia: Secondary | ICD-10-CM | POA: Diagnosis not present

## 2021-12-29 DIAGNOSIS — R0600 Dyspnea, unspecified: Secondary | ICD-10-CM | POA: Diagnosis not present

## 2022-01-02 ENCOUNTER — Telehealth: Payer: Self-pay

## 2022-01-02 NOTE — Telephone Encounter (Signed)
Forms signed and faxed.

## 2022-01-02 NOTE — Telephone Encounter (Addendum)
Linton Ham is calling to check on the status of the forms on 6/30. They called back 7/12 and spoke with Minesiha and again on 7/18 Ellison Carwin), 7/21(Mineshia), 7/27 Baxter Flattery), 8/8 Judson Roch).   Linton Ham will e-mail forms again incase they can not be located.   I printed it and handed it over to [place].  If found please discard the new forms if original one are located.   Please advise Shemeka CB [phone]

## 2022-01-05 ENCOUNTER — Ambulatory Visit (INDEPENDENT_AMBULATORY_CARE_PROVIDER_SITE_OTHER): Payer: Medicare Other | Admitting: Family Medicine

## 2022-01-05 ENCOUNTER — Encounter (INDEPENDENT_AMBULATORY_CARE_PROVIDER_SITE_OTHER): Payer: Self-pay | Admitting: Family Medicine

## 2022-01-05 VITALS — BP 106/65 | HR 64 | Temp 97.8°F | Ht 70.0 in | Wt 260.0 lb

## 2022-01-05 DIAGNOSIS — M961 Postlaminectomy syndrome, not elsewhere classified: Secondary | ICD-10-CM | POA: Diagnosis not present

## 2022-01-05 DIAGNOSIS — E669 Obesity, unspecified: Secondary | ICD-10-CM | POA: Diagnosis not present

## 2022-01-05 DIAGNOSIS — Z6837 Body mass index (BMI) 37.0-37.9, adult: Secondary | ICD-10-CM

## 2022-01-05 DIAGNOSIS — Z7985 Long-term (current) use of injectable non-insulin antidiabetic drugs: Secondary | ICD-10-CM

## 2022-01-05 DIAGNOSIS — E1165 Type 2 diabetes mellitus with hyperglycemia: Secondary | ICD-10-CM

## 2022-01-05 MED ORDER — OZEMPIC (0.25 OR 0.5 MG/DOSE) 2 MG/1.5ML ~~LOC~~ SOPN: 0.5000 mg | PEN_INJECTOR | SUBCUTANEOUS | 0 refills | Status: DC

## 2022-01-09 ENCOUNTER — Other Ambulatory Visit: Payer: Self-pay | Admitting: Internal Medicine

## 2022-01-09 ENCOUNTER — Other Ambulatory Visit (INDEPENDENT_AMBULATORY_CARE_PROVIDER_SITE_OTHER): Payer: Self-pay | Admitting: Family Medicine

## 2022-01-09 DIAGNOSIS — E1165 Type 2 diabetes mellitus with hyperglycemia: Secondary | ICD-10-CM

## 2022-01-12 DIAGNOSIS — G894 Chronic pain syndrome: Secondary | ICD-10-CM | POA: Diagnosis not present

## 2022-01-12 DIAGNOSIS — M533 Sacrococcygeal disorders, not elsewhere classified: Secondary | ICD-10-CM | POA: Diagnosis not present

## 2022-01-12 DIAGNOSIS — Z79899 Other long term (current) drug therapy: Secondary | ICD-10-CM | POA: Diagnosis not present

## 2022-01-12 DIAGNOSIS — M961 Postlaminectomy syndrome, not elsewhere classified: Secondary | ICD-10-CM | POA: Diagnosis not present

## 2022-01-16 ENCOUNTER — Telehealth (INDEPENDENT_AMBULATORY_CARE_PROVIDER_SITE_OTHER): Payer: Medicare Other | Admitting: Psychology

## 2022-01-16 ENCOUNTER — Other Ambulatory Visit (INDEPENDENT_AMBULATORY_CARE_PROVIDER_SITE_OTHER): Payer: Self-pay | Admitting: Family Medicine

## 2022-01-16 DIAGNOSIS — F3289 Other specified depressive episodes: Secondary | ICD-10-CM | POA: Diagnosis not present

## 2022-01-16 DIAGNOSIS — E1165 Type 2 diabetes mellitus with hyperglycemia: Secondary | ICD-10-CM

## 2022-01-16 NOTE — Progress Notes (Signed)
Office: [phone]  /  Fax: [phone]    Date: January 16, 2022    Appointment Start Time: 11:01am Duration: 29 minutes Provider: Glennie Isle, Psy.D. Type of Session: Individual Therapy  Location of Patient: Home (private location) Location of Provider: Provider's Home (private office) Type of Contact: Telepsychological Visit via MyChart Video Visit  Session Content: Travis [name] is a 71 y.o. male presenting for a follow-up appointment to address the previously established treatment goal of increasing coping skills.Today's appointment was a telepsychological visit. Traylon provided verbal consent for today's telepsychological appointment and he is aware he is responsible for securing confidentiality on his end of the session. Prior to proceeding with today's appointment, [name]'s physical location at the time of this appointment was obtained as well a phone number he could be reached at in the event of technical difficulties. Brnadon and this provider participated in today's telepsychological service.   This provider conducted a brief check-in. [name] reported he is "good," but believes he tore his rotator cuff secondary to working out. He indicated his provider is aware and is scheduled for an appointment for evaluation. [name] shared further about recent events, adding he is getting a puppy Therapist, occupational) on September 15th. Regarding eating habits, he shared he is feeling "frustrated" as it has been "up and down." [name] also noted a change in insurance will result in him having to pay a co-pay for his visits with the clinic. A risk assessment was completed. [name] denied experiencing suicidal, self-harm, homicidal ideation, plan, and intent since the last appointment with this provider. He continues to acknowledge understanding regarding the importance of reaching out to trusted individuals and/or emergency resources if he is unable to ensure safety. Regarding establishing care with a therapist,  he noted [name] has not contacted him yet. He provided verbal consent for this provider to place a referral with [place] to address ongoing stressors. Today's appointment focused on assisting [name] get back on track to ensure he is eating congruent to his prescribed structured meal (e.g., developing a master grocery list; eliminating tempting foods in the home; using wraps instead of regular bread; trying to include protein at every meal/snack; doubling recipes to have on days pain is high; purchasing frozen vegetables to microwave). Overall, [name] was receptive to today's appointment as evidenced by openness to sharing, responsiveness to feedback, and willingness to implement discussed strategies .  Mental Status Examination:  Appearance: neat Behavior: appropriate to circumstances Mood: neutral Affect: mood congruent Speech: WNL Eye Contact: appropriate Psychomotor Activity: WNL Gait: unable to assess Thought Process: linear, logical, and goal directed and denies suicidal, homicidal, and self-harm ideation, plan and intent  Thought Content/Perception: no hallucinations, delusions, bizarre thinking or behavior endorsed or observed Orientation: AAOx4 Memory/Concentration: memory, attention, language, and fund of knowledge intact  Insight: good Judgment: fair  Interventions:  Conducted a brief chart review Conducted a risk assessment Provided empathic reflections and validation Employed supportive psychotherapy interventions to facilitate reduced distress and to improve coping skills with identified stressors Engaged patient in problem solving Recommended/discussed option for longer-term therapeutic services  DSM-5 Diagnosis(es): F50.89 Other Specified Feeding or Eating Disorder, Emotional and Binge Eating Behaviors  Treatment Goal & Progress: During the initial appointment with this provider, the following treatment goal was established: increase coping skills.  [name] has demonstrated progress in his goal as evidenced by increased awareness of hunger patterns, increased awareness of triggers for emotional eating behaviors, reduction in emotional eating behaviors , and reduction in binge eating behaviors. [name] also continues  to demonstrate willingness to engage in learned skill(s).  Plan: Due to current challenges, the next appointment is scheduled for 01/31/2022 at 11am, which will be via MyChart Video Visit. The next session will focus on working towards the established treatment goal. A referral will be placed with [facility].

## 2022-01-16 NOTE — Progress Notes (Unsigned)
Chief Complaint:   OBESITY Travis [name] is here to discuss his progress with his obesity treatment plan along with follow-up of his obesity related diagnoses. Travis [name] is on the Category 4 Plan and keeping a food journal and adhering to recommended goals of 450-600 calories and 40+ grams of protein at lunch and states Travis [name] is following his eating plan approximately ?% of the time. Travis [name] states Travis [name] is working out 90 minutes 5 times per week.  Today's visit was #: 12 Starting weight: 302 lbs Starting date: 04/06/2021 Today's weight: 260 lbs Today's date: 01/05/2022 Total lbs lost to date: 42 lbs Total lbs lost since last in-office visit: 0  Interim History: Travis [name] feels disappointed with weight gain. Realizes Travis [name] was being less strict in his intake of carbohydrates. Travis [name] has possible surgery scheduled on 9/18--still awaiting confirmation. Travis [name] recognizes that Travis [name] really can not have as much readily available food that is increased in carbohydrates. Exercises have been on point.  Subjective:   1. Type 2 diabetes mellitus with hyperglycemia, without long-term current use of insulin (HCC) Travis [name] is currently on Ozempic 0.5 mg SubQ weekly. His last A1c was 5.1 early July.  2. Post laminectomy syndrome Travis [name] possibly to undergo SI Joint Fusion. Awaiting confirmation of Sept 19th surgery.  Assessment/Plan:   1. Type 2 diabetes mellitus with hyperglycemia, without long-term current use of insulin (HCC) We will refill Ozempic 0.5 mg SubQ once weekly for 1 month with 0 refills.  -Refill Semaglutide,0.25 or 0.5MG/DOS, (OZEMPIC, 0.25 OR 0.5 MG/DOSE,) 2 MG/1.5ML SOPN; Inject 0.5 mg into the skin once a week.  Dispense: 3 mL; Refill: 0  2. Post laminectomy syndrome Travis [name] will follow up at next appointment with plans for surgery.  3. Obesity with current BMI of 37.3 Travis [name] is currently in the action stage of change. As such, his goal is to continue with weight loss efforts. Travis [name] has agreed to the  Category 4 Plan.   Exercise goals: All adults should avoid inactivity. Some physical activity is better than none, and adults who participate in any amount of physical activity gain some health benefits.  Behavioral modification strategies: increasing lean protein intake, meal planning and cooking strategies, keeping healthy foods in the home, and planning for success.  Travis [name] has agreed to follow-up with our clinic in 3 weeks. Travis [name] was informed of the importance of frequent follow-up visits to maximize his success with intensive lifestyle modifications for his multiple health conditions.   Objective:   Blood pressure 106/65, pulse 64, temperature 97.8 F (36.6 C), height 5' 10"  (1.778 m), weight 260 lb (117.9 kg), SpO2 96 %. Body mass index is 37.31 kg/m.  General: Cooperative, alert, well developed, in no acute distress. HEENT: Conjunctivae and lids unremarkable. Cardiovascular: Regular rhythm.  Lungs: Normal work of breathing. Neurologic: No focal deficits.   Lab Results  Component Value Date   CREATININE 0.76 11/29/2021   BUN 12 11/29/2021   NA 136 11/29/2021   K 4.3 11/29/2021   CL 108 11/29/2021   CO2 23 11/29/2021   Lab Results  Component Value Date   ALT 12 11/25/2021   AST 21 11/25/2021   ALKPHOS 59 11/25/2021   BILITOT 0.9 11/25/2021   Lab Results  Component Value Date   HGBA1C 5.1 11/27/2021   HGBA1C 5.5 06/14/2021   HGBA1C 5.6 04/11/2021   HGBA1C 5.8 (H) 03/09/2021   HGBA1C 6.1 12/01/2020   Lab Results  Component Value Date   INSULIN 12.2 04/11/2021   Lab Results  Component Value Date   TSH 2.63 06/14/2021   Lab Results  Component Value Date   CHOL 129 06/14/2021   HDL 48.10 06/14/2021   LDLCALC 60 06/14/2021   TRIG 104.0 06/14/2021   CHOLHDL 3 06/14/2021   Lab Results  Component Value Date   VD25OH 40.99 06/14/2021   VD25OH 51.0 04/11/2021   VD25OH 53.07 12/01/2020   Lab Results  Component Value Date   WBC 5.2 11/29/2021   HGB 10.7 (L)  11/29/2021   HCT 31.9 (L) 11/29/2021   MCV 91.7 11/29/2021   PLT 191 11/29/2021   Lab Results  Component Value Date   IRON 55 11/29/2021   TIBC 192 (L) 11/29/2021   FERRITIN 138 11/29/2021    Obesity Behavioral Intervention:   Approximately 15 minutes were spent on the discussion below.  ASK: We discussed the diagnosis of obesity with Travis [name] today and Travis [name] agreed to give [place] permission to discuss obesity behavioral modification therapy today.  ASSESS: Travis [name] has the diagnosis of obesity and his BMI today is 37.3. Travis [name] is in the action stage of change.   ADVISE: Travis [name] was educated on the multiple health risks of obesity as well as the benefit of weight loss to improve his health. Travis [name] was advised of the need for long term treatment and the importance of lifestyle modifications to improve his current health and to decrease his risk of future health problems.  AGREE: Multiple dietary modification options and treatment options were discussed and Travis [name] agreed to follow the recommendations documented in the above note.  ARRANGE: Travis [name] was educated on the importance of frequent visits to treat obesity as outlined per CMS and USPSTF guidelines and agreed to schedule his next follow up appointment today.  Attestation Statements:   Reviewed by clinician on day of visit: allergies, medications, problem list, medical history, surgical history, family history, social history, and previous encounter notes.  I, Elnora Morrison, RMA am acting as transcriptionist for Coralie Common, MD.  I have reviewed the above documentation for accuracy and completeness, and I agree with the above. - Coralie Common, MD

## 2022-01-17 ENCOUNTER — Other Ambulatory Visit (INDEPENDENT_AMBULATORY_CARE_PROVIDER_SITE_OTHER): Payer: Self-pay | Admitting: Family Medicine

## 2022-01-17 DIAGNOSIS — Z89511 Acquired absence of right leg below knee: Secondary | ICD-10-CM | POA: Diagnosis not present

## 2022-01-17 DIAGNOSIS — E1142 Type 2 diabetes mellitus with diabetic polyneuropathy: Secondary | ICD-10-CM | POA: Diagnosis not present

## 2022-01-17 DIAGNOSIS — E1165 Type 2 diabetes mellitus with hyperglycemia: Secondary | ICD-10-CM

## 2022-01-19 DIAGNOSIS — U071 COVID-19: Secondary | ICD-10-CM | POA: Diagnosis not present

## 2022-01-24 ENCOUNTER — Other Ambulatory Visit (HOSPITAL_COMMUNITY): Payer: Self-pay | Admitting: Psychiatry

## 2022-01-24 DIAGNOSIS — F419 Anxiety disorder, unspecified: Secondary | ICD-10-CM

## 2022-01-24 DIAGNOSIS — Z79891 Long term (current) use of opiate analgesic: Secondary | ICD-10-CM | POA: Diagnosis not present

## 2022-01-24 DIAGNOSIS — Z7985 Long-term (current) use of injectable non-insulin antidiabetic drugs: Secondary | ICD-10-CM | POA: Diagnosis not present

## 2022-01-24 DIAGNOSIS — G8929 Other chronic pain: Secondary | ICD-10-CM | POA: Diagnosis not present

## 2022-01-24 DIAGNOSIS — F319 Bipolar disorder, unspecified: Secondary | ICD-10-CM

## 2022-01-25 ENCOUNTER — Ambulatory Visit (INDEPENDENT_AMBULATORY_CARE_PROVIDER_SITE_OTHER): Payer: Medicare Other | Admitting: Psychology

## 2022-01-25 DIAGNOSIS — F331 Major depressive disorder, recurrent, moderate: Secondary | ICD-10-CM
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
## 2022-01-27 ENCOUNTER — Telehealth: Payer: Self-pay
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
## 2022-01-29 DIAGNOSIS — R0600 Dyspnea, unspecified: Secondary | ICD-10-CM | POA: Diagnosis not present

## 2022-01-29 DIAGNOSIS — J9620 Acute and chronic respiratory failure, unspecified whether with hypoxia or hypercapnia: Secondary | ICD-10-CM | POA: Diagnosis not present

## 2022-01-31 ENCOUNTER — Telehealth (INDEPENDENT_AMBULATORY_CARE_PROVIDER_SITE_OTHER): Payer: Self-pay | Admitting: Psychology

## 2022-01-31 ENCOUNTER — Telehealth (INDEPENDENT_AMBULATORY_CARE_PROVIDER_SITE_OTHER): Payer: Medicare Other | Admitting: Psychology

## 2022-01-31 NOTE — Progress Notes (Unsigned)
  Office: [phone]  /  Fax: [phone]    Date: January 31, 2022    Appointment Start Time: *** Duration: *** minutes Provider: Glennie Isle, Psy.D. Type of Session: Individual Therapy  Location of Patient: {gbptloc:23249} (private location) Location of Provider: Provider's Home (private office) Type of Contact: Telepsychological Visit via MyChart Video Visit  Session Content:This provider called Travis [name] at 11:03am as he did not present for today's appointment. *** As such, today's appointment was initiated *** minutes late. Razi is a 70 y.o. male presenting for a follow-up appointment to address the previously established treatment goal of increasing coping skills.Today's appointment was a telepsychological visit. Zamarion provided verbal consent for today's telepsychological appointment and he is aware he is responsible for securing confidentiality on his end of the session. Prior to proceeding with today's appointment, Danyal's physical location at the time of this appointment was obtained as well a phone number he could be reached at in the event of technical difficulties. Yony and this provider participated in today's telepsychological service.   This provider conducted a brief check-in. *** [place] was receptive to today's appointment as evidenced by openness to sharing, responsiveness to feedback, and {gbreceptiveness:23401}.  Mental Status Examination:  Appearance: {Appearance:22431} Behavior: {Behavior:22445} Mood: {gbmood:21757} Affect: {Affect:22436} Speech: {Speech:22432} Eye Contact: {Eye Contact:22433} Psychomotor Activity: {Motor Activity:22434} Gait: {gbgait:23404} Thought Process: {thought process:22448}  Thought Content/Perception: {disturbances:22451} Orientation: {Orientation:22437} Memory/Concentration: {gbcognition:22449} Insight: {Insight:22446} Judgment: {Insight:22446}  Interventions:  {Interventions for Progress Notes:23405}  DSM-5  Diagnosis(es): F32.89 Other Specified Depressive Disorder, Emotional and Binge Eating Behaviors  Treatment Goal & Progress: During the initial appointment with this provider, the following treatment goal was established: increase coping skills. Jasdeep has demonstrated progress in his goal as evidenced by {gbtxprogress:22839}. Param also {gbtxprogress2:22951}.  Plan: The next appointment is scheduled for *** at ***, which will be via MyChart Video Visit. The next session will focus on {Plan for Next Appointment:23400}.

## 2022-01-31 NOTE — Telephone Encounter (Signed)
  Office: [phone]  /  Fax: [phone]  Date of Call: January 31, 2022  Time of Call: 11:03am Duration of Call: 2 minute(s) Provider: Glennie Isle, PsyD  CONTENT: This provider called Travis [name] to check-in as he did not present for today's MyChart Video Visit appointment at 11am. He indicated he forgot about today's appointment and requested to reschedule. A brief check-in was conducted. Travis [name] reported he continues to experience back pain and his surgery was rescheduled to "some time October." He was encouraged to contact his doctor regarding the ongoing pain; he agreed. Travis [name] will continue meeting with his primary therapist (Dr. Cheryln Manly), noting their next appointment is 02/09/2022. A brief risk assessment was completed. Travis [name] denied experiencing suicidal and homicidal ideation, plan, or intent since the last appointment with this provider. All questions/concerns addressed.   PLAN: Travis [name] is scheduled for an appointment on 02/07/2022 at 2pm via [place] Visit. The one time no show fee waiver will be applied to today's appointment.

## 2022-02-01 ENCOUNTER — Ambulatory Visit (INDEPENDENT_AMBULATORY_CARE_PROVIDER_SITE_OTHER): Payer: Medicare Other | Admitting: Family Medicine

## 2022-02-01 ENCOUNTER — Encounter: Payer: Self-pay | Admitting: Gastroenterology

## 2022-02-01 ENCOUNTER — Ambulatory Visit: Payer: Medicare Other | Admitting: Orthopaedic Surgery

## 2022-02-01 DIAGNOSIS — J40 Bronchitis, not specified as acute or chronic: Secondary | ICD-10-CM | POA: Diagnosis not present

## 2022-02-01 DIAGNOSIS — Z79899 Other long term (current) drug therapy: Secondary | ICD-10-CM | POA: Diagnosis not present

## 2022-02-04 ENCOUNTER — Other Ambulatory Visit: Payer: Self-pay | Admitting: Internal Medicine

## 2022-02-06 NOTE — Telephone Encounter (Signed)
I dont know what to do with this information

## 2022-02-07 ENCOUNTER — Ambulatory Visit (INDEPENDENT_AMBULATORY_CARE_PROVIDER_SITE_OTHER): Payer: Medicare Other | Admitting: Psychology

## 2022-02-07 ENCOUNTER — Encounter (INDEPENDENT_AMBULATORY_CARE_PROVIDER_SITE_OTHER): Payer: Self-pay

## 2022-02-07 ENCOUNTER — Telehealth (INDEPENDENT_AMBULATORY_CARE_PROVIDER_SITE_OTHER): Payer: Medicare Other | Admitting: Psychology

## 2022-02-07 DIAGNOSIS — F331 Major depressive disorder, recurrent, moderate: Secondary | ICD-10-CM | POA: Diagnosis not present
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
## 2022-02-07 NOTE — Telephone Encounter (Signed)
I will reach out to the caller to get the specifics of the request.

## 2022-02-08 ENCOUNTER — Other Ambulatory Visit: Payer: Self-pay | Admitting: Cardiovascular Disease

## 2022-02-08 DIAGNOSIS — J9611 Chronic respiratory failure with hypoxia: Secondary | ICD-10-CM | POA: Diagnosis not present

## 2022-02-13 DIAGNOSIS — I10 Essential (primary) hypertension: Secondary | ICD-10-CM | POA: Diagnosis not present

## 2022-02-13 DIAGNOSIS — J9611 Chronic respiratory failure with hypoxia: Secondary | ICD-10-CM | POA: Diagnosis not present

## 2022-02-13 DIAGNOSIS — E1165 Type 2 diabetes mellitus with hyperglycemia: Secondary | ICD-10-CM | POA: Diagnosis not present

## 2022-02-13 DIAGNOSIS — E7800 Pure hypercholesterolemia, unspecified: Secondary | ICD-10-CM | POA: Diagnosis not present

## 2022-02-14 DIAGNOSIS — J40 Bronchitis, not specified as acute or chronic: Secondary | ICD-10-CM | POA: Diagnosis not present

## 2022-02-14 DIAGNOSIS — Z89511 Acquired absence of right leg below knee: Secondary | ICD-10-CM | POA: Diagnosis not present

## 2022-02-14 DIAGNOSIS — R053 Chronic cough: Secondary | ICD-10-CM | POA: Diagnosis not present

## 2022-02-14 DIAGNOSIS — J329 Chronic sinusitis, unspecified: Secondary | ICD-10-CM | POA: Diagnosis not present

## 2022-02-14 DIAGNOSIS — J9611 Chronic respiratory failure with hypoxia: Secondary | ICD-10-CM | POA: Diagnosis not present

## 2022-02-15 DIAGNOSIS — Z7951 Long term (current) use of inhaled steroids: Secondary | ICD-10-CM | POA: Diagnosis not present

## 2022-02-15 DIAGNOSIS — E7800 Pure hypercholesterolemia, unspecified: Secondary | ICD-10-CM | POA: Diagnosis not present

## 2022-02-15 DIAGNOSIS — J9611 Chronic respiratory failure with hypoxia: Secondary | ICD-10-CM | POA: Diagnosis not present

## 2022-02-15 DIAGNOSIS — Z7985 Long-term (current) use of injectable non-insulin antidiabetic drugs: Secondary | ICD-10-CM | POA: Diagnosis not present

## 2022-02-16 ENCOUNTER — Encounter (HOSPITAL_COMMUNITY): Payer: Self-pay | Admitting: Psychiatry

## 2022-02-16 ENCOUNTER — Telehealth: Payer: Medicaid Other

## 2022-02-16 ENCOUNTER — Telehealth (HOSPITAL_BASED_OUTPATIENT_CLINIC_OR_DEPARTMENT_OTHER): Payer: Medicare Other | Admitting: Psychiatry

## 2022-02-16 DIAGNOSIS — E7800 Pure hypercholesterolemia, unspecified: Secondary | ICD-10-CM | POA: Diagnosis not present

## 2022-02-16 DIAGNOSIS — F419 Anxiety disorder, unspecified: Secondary | ICD-10-CM

## 2022-02-16 DIAGNOSIS — E1140 Type 2 diabetes mellitus with diabetic neuropathy, unspecified: Secondary | ICD-10-CM | POA: Diagnosis not present

## 2022-02-16 DIAGNOSIS — I2510 Atherosclerotic heart disease of native coronary artery without angina pectoris: Secondary | ICD-10-CM | POA: Diagnosis not present

## 2022-02-16 DIAGNOSIS — F319 Bipolar disorder, unspecified: Secondary | ICD-10-CM

## 2022-02-16 DIAGNOSIS — M159 Polyosteoarthritis, unspecified: Secondary | ICD-10-CM | POA: Diagnosis not present

## 2022-02-16 DIAGNOSIS — E1165 Type 2 diabetes mellitus with hyperglycemia: Secondary | ICD-10-CM | POA: Diagnosis not present

## 2022-02-16 MED ORDER — DULOXETINE HCL 30 MG PO CPEP: 30.0000 mg | ORAL_CAPSULE | Freq: Two times a day (BID) | ORAL | 1 refills | Status: DC

## 2022-02-16 NOTE — Progress Notes (Signed)
Virtual Visit via Video Note  I connected with Travis Novak Sr. on 02/16/22 at 11:00 AM EDT by a video enabled telemedicine application and verified that I am speaking with the correct person using two identifiers.  Location: Patient: Home Provider: Home Office   I discussed the limitations of evaluation and management by telemedicine and the availability of in person appointments. The patient expressed understanding and agreed to proceed.  History of Present Illness: Patient is evaluated by video session.  He requested an earlier appointment because he is not doing very well and he notices depression and anxiety coming back.  He has no motivation to do things.  He feels tired and he complained of pain constantly.  He is very disappointed because he is supposed to have a back surgery but that has been delayed.  He is frustrated with the process.  He feels the current pain medicine not working very well and that triggers his mood and he noticed sometimes irritable, angry.  He had lost weight because he is on Ozempic and is happy about it.  His appetite is fair.  His energy level is low.  He denies any hallucination, suicidal thoughts, paranoia or any manic episodes.  He is compliant with medication which includes Abilify, trazodone, Cymbalta from our office and also he takes pain medicine, gabapentin and Ambien from other provider.  Patient told he is not sure if he can go to [place] to see his brother which was plan to watch [employer] football game because of the pain.  He is disappointed.  Patient denies any aggression or violence.  His living condition is good.   Past Psychiatric History:  Saw psychiatrist in [place] and [place].  H/O mood swings, suicidal gesture, anger, hallucination, irritability and increase spending. Married 5 times. H/O inpatient in 2014 due to overdose on Xanax and alcohol.  Did IOP. Took Effexor, Xanax, Klonopin, Ambien, Lunesta, Wellbutrin, Ambien, Remeron and  Prozac. Abilify started in our office.   Psychiatric Specialty Exam: Physical Exam  Review of Systems  Weight 246 lb (111.6 kg).There is no height or weight on file to calculate BMI.  General Appearance: Bizarre  Eye Contact:  Fair  Speech:  Slow  Volume:  Normal  Mood:  Depressed and Dysphoric  Affect:  Appropriate  Thought Process:  Descriptions of Associations: Intact  Orientation:  Full (Time, Place, and Person)  Thought Content:  Rumination  Suicidal Thoughts:  No  Homicidal Thoughts:  No  Memory:  Immediate;   Good Recent;   Fair Remote;   Fair  Judgement:  Intact  Insight:  Present  Psychomotor Activity:  Normal  Concentration:  Concentration: Fair and Attention Span: Fair  Recall:  Good  Fund of Knowledge:  Fair  Language:  Good  Akathisia:  No  Handed:  Right  AIMS (if indicated):     Assets:  Communication Skills Desire for Improvement Housing Social Support  ADL's:  Intact  Cognition:  WNL  Sleep:   5-6 hrs      Assessment and Plan: Bipolar disorder type I.  Anxiety.  I reviewed current medication.  Patient is disappointed because his surgery is continued to extend and he admitted getting frustrated because his pain level is increased.  I encouraged to contact his surgeon to discuss the reason for his delayed.  Despite taking narcotic pain medicine and muscle relaxant he continues to have a lot of pain in his back.  We talk about polypharmacy.  Patient like to address  his depression and anxiety since he is not motivated to do things.  He has no suicidal thoughts.  I recommend a trial of Cymbalta dose to increase from 20 mg 2 times a day to 30 mg 2 times a day.  Continue Abilify 10 mg daily and trazodone 50 mg at bedtime.  Recommended to call us back if is any question or any concern.  Discussed safety concerns or any time having active suicidal thoughts or homicidal thought that he need to call 911 or go to local emergency room.  Patient has appointment coming  up in November and I recommend to keep that appointment.  Follow Up Instructions:    I discussed the assessment and treatment plan with the patient. The patient was provided an opportunity to ask questions and all were answered. The patient agreed with the plan and demonstrated an understanding of the instructions.   The patient was advised to call back or seek an in-person evaluation if the symptoms worsen or if the condition fails to improve as anticipated.  Collaboration of Care: Other provider involved in patient's care AEB patient will contact his surgeon to address his surgery concerns which has been delayed.  Patient/Guardian was advised Release of Information must be obtained prior to any record release in order to collaborate their care with an outside provider. Patient/Guardian was advised if they have not already done so to contact the registration department to sign all necessary forms in order for [place] to release information regarding their care.   Consent: Patient/Guardian gives verbal consent for treatment and assignment of benefits for services provided during this visit. Patient/Guardian expressed understanding and agreed to proceed.    I provided 26 minutes of non-face-to-face time during this encounter.   Kathlee Nations, MD

## 2022-02-20 ENCOUNTER — Ambulatory Visit (INDEPENDENT_AMBULATORY_CARE_PROVIDER_SITE_OTHER): Payer: Medicare Other | Admitting: Psychology

## 2022-02-20 DIAGNOSIS — F319 Bipolar disorder, unspecified: Secondary | ICD-10-CM | POA: Diagnosis not present
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
## 2022-02-21 DIAGNOSIS — J329 Chronic sinusitis, unspecified: Secondary | ICD-10-CM | POA: Diagnosis not present

## 2022-02-28 DIAGNOSIS — J9610 Chronic respiratory failure, unspecified whether with hypoxia or hypercapnia: Secondary | ICD-10-CM | POA: Diagnosis not present

## 2022-03-02 DIAGNOSIS — I10 Essential (primary) hypertension: Secondary | ICD-10-CM | POA: Diagnosis not present

## 2022-03-02 DIAGNOSIS — M159 Polyosteoarthritis, unspecified: Secondary | ICD-10-CM | POA: Diagnosis not present

## 2022-03-02 DIAGNOSIS — G894 Chronic pain syndrome: Secondary | ICD-10-CM | POA: Diagnosis not present

## 2022-03-02 DIAGNOSIS — J411 Mucopurulent chronic bronchitis: Secondary | ICD-10-CM | POA: Diagnosis not present

## 2022-03-02 DIAGNOSIS — E7800 Pure hypercholesterolemia, unspecified: Secondary | ICD-10-CM | POA: Diagnosis not present

## 2022-03-02 DIAGNOSIS — I2510 Atherosclerotic heart disease of native coronary artery without angina pectoris: Secondary | ICD-10-CM | POA: Diagnosis not present

## 2022-03-14 DIAGNOSIS — G894 Chronic pain syndrome: Secondary | ICD-10-CM | POA: Diagnosis not present

## 2022-03-15 ENCOUNTER — Other Ambulatory Visit (HOSPITAL_COMMUNITY): Payer: Self-pay | Admitting: Psychiatry

## 2022-03-15 DIAGNOSIS — F319 Bipolar disorder, unspecified: Secondary | ICD-10-CM

## 2022-03-15 DIAGNOSIS — F419 Anxiety disorder, unspecified: Secondary | ICD-10-CM

## 2022-03-25 ENCOUNTER — Other Ambulatory Visit (HOSPITAL_COMMUNITY): Payer: Self-pay | Admitting: Psychiatry

## 2022-03-25 DIAGNOSIS — F419 Anxiety disorder, unspecified: Secondary | ICD-10-CM

## 2022-03-25 DIAGNOSIS — F319 Bipolar disorder, unspecified: Secondary | ICD-10-CM

## 2022-03-29 ENCOUNTER — Encounter (HOSPITAL_COMMUNITY): Payer: Self-pay | Admitting: Psychiatry

## 2022-03-29 ENCOUNTER — Telehealth (HOSPITAL_BASED_OUTPATIENT_CLINIC_OR_DEPARTMENT_OTHER): Payer: Medicare Other | Admitting: Psychiatry

## 2022-03-29 DIAGNOSIS — F419 Anxiety disorder, unspecified: Secondary | ICD-10-CM

## 2022-03-29 DIAGNOSIS — F319 Bipolar disorder, unspecified: Secondary | ICD-10-CM

## 2022-03-29 MED ORDER — TRAZODONE HCL 150 MG PO TABS: 150.0000 mg | ORAL_TABLET | Freq: Every day | ORAL | 0 refills | Status: DC

## 2022-03-29 MED ORDER — ARIPIPRAZOLE 10 MG PO TABS
10.0000 mg | ORAL_TABLET | Freq: Every day | ORAL | 0 refills | Status: DC
Start: 2022-03-29 — End: 2022-06-28

## 2022-03-29 MED ORDER — DULOXETINE HCL 30 MG PO CPEP: 30.0000 mg | ORAL_CAPSULE | Freq: Two times a day (BID) | ORAL | 0 refills | Status: DC

## 2022-03-29 NOTE — Progress Notes (Signed)
Virtual Visit via Video Note  I connected with Travis [name]. on 03/29/22 at 10:20 AM EST by a video enabled telemedicine application and verified that I am speaking with the correct person using two identifiers.  Location: Patient: Home Provider: Home Office   I discussed the limitations of evaluation and management by telemedicine and the availability of in person appointments. The patient expressed understanding and agreed to proceed.  History of Present Illness: Patient is evaluated by video session.  On the last visit we increased Cymbalta and he is taking 30 mg 2 times a day.  He noticed improvement in his mood irritability, depression and anxiety.  He is still working with [employer] and the Psychologist, sport and exercise to have the back surgery.  He is hoping issue resolved soon.  He started regularly going to [employer] 3 times a week with his neighbors.  He tried to remain active and motivated.  Denies any hallucination, mania, anger, agitation.  He has no tremors or shakes.  His biggest concern is chronic pain in his back.  He noticed increased Cymbalta that had giving him motivation to do things.  He is also compliant with trazodone, Abilify.  He also takes pain medicine, gabapentin, Ambien from other provider.  He lives by himself but he had a good support from his neighbor.  He is very close to his niece but since she is working 3 jobs and very busy has not able to see her as frequently.  Patient appetite is okay.  His weight is stable.  He checks his blood sugar regularly.  He is on Ozempic.   Past Psychiatric History:  Saw psychiatrist in [place] and [place].  H/O mood swings, suicidal gesture, anger, hallucination, irritability and increase spending. Married 5 times. H/O inpatient in 2014 due to overdose on Xanax and alcohol.  Did IOP. Took Effexor, Xanax, Klonopin, Ambien, Lunesta, Wellbutrin, Ambien, Remeron and Prozac. Abilify started in our office.    Psychiatric Specialty  Exam: Physical Exam  Review of Systems  Weight 250 lb (113.4 kg).There is no height or weight on file to calculate BMI.  General Appearance: Casual  Eye Contact:  Fair  Speech:  Normal Rate  Volume:  Normal  Mood:  Dysphoric  Affect:  Congruent  Thought Process:  Goal Directed  Orientation:  Full (Time, Place, and Person)  Thought Content:  Logical  Suicidal Thoughts:  No  Homicidal Thoughts:  No  Memory:  Immediate;   Good Recent;   Good Remote;   Good  Judgement:  Intact  Insight:  Present  Psychomotor Activity:  Normal  Concentration:  Concentration: Good and Attention Span: Good  Recall:  Good  Fund of Knowledge:  Good  Language:  Good  Akathisia:  No  Handed:  Right  AIMS (if indicated):     Assets:  Communication Skills Desire for Improvement Housing Social Support  ADL's:  Intact  Cognition:  WNL  Sleep:         Assessment and Plan: Bipolar disorder type I.  Anxiety.  Patient doing better with increased Cymbalta as he feels less anxious less irritable and less depressed.  So far he is tolerating his medication and reported no side effects.  He is trying to get which resolved so he can have back surgery.  He does not want to change the medication.  Discussed polypharmacy as patient taking Ambien, narcotic pain medication and gabapentin from other provider.  I will continue Cymbalta 30 mg 2 times a  day, Abilify 10 mg daily and trazodone 150 mg at bedtime.  Recommended to call us back if is any question or any concern.  Follow-up in 3 months.  Follow Up Instructions:    I discussed the assessment and treatment plan with the patient. The patient was provided an opportunity to ask questions and all were answered. The patient agreed with the plan and demonstrated an understanding of the instructions.   The patient was advised to call back or seek an in-person evaluation if the symptoms worsen or if the condition fails to improve as anticipated.  Collaboration of  Care: Other provider involved in patient's care AEB notes are available in epic to review.  Patient/Guardian was advised Release of Information must be obtained prior to any record release in order to collaborate their care with an outside provider. Patient/Guardian was advised if they have not already done so to contact the registration department to sign all necessary forms in order for [place] to release information regarding their care.   Consent: Patient/Guardian gives verbal consent for treatment and assignment of benefits for services provided during this visit. Patient/Guardian expressed understanding and agreed to proceed.    I provided 22 minutes of non-face-to-face time during this encounter.   Kathlee Nations, MD

## 2022-03-31 DIAGNOSIS — Z9989 Dependence on other enabling machines and devices: Secondary | ICD-10-CM | POA: Diagnosis not present

## 2022-03-31 DIAGNOSIS — Z8639 Personal history of other endocrine, nutritional and metabolic disease: Secondary | ICD-10-CM | POA: Diagnosis not present

## 2022-03-31 DIAGNOSIS — R0602 Shortness of breath: Secondary | ICD-10-CM | POA: Diagnosis not present

## 2022-03-31 DIAGNOSIS — J9610 Chronic respiratory failure, unspecified whether with hypoxia or hypercapnia: Secondary | ICD-10-CM | POA: Diagnosis not present

## 2022-04-03 DIAGNOSIS — R0602 Shortness of breath: Secondary | ICD-10-CM | POA: Diagnosis not present

## 2022-04-06 DIAGNOSIS — M47818 Spondylosis without myelopathy or radiculopathy, sacral and sacrococcygeal region: Secondary | ICD-10-CM | POA: Insufficient documentation

## 2022-04-06 DIAGNOSIS — G894 Chronic pain syndrome: Secondary | ICD-10-CM | POA: Diagnosis not present

## 2022-04-06 DIAGNOSIS — M961 Postlaminectomy syndrome, not elsewhere classified: Secondary | ICD-10-CM | POA: Diagnosis not present

## 2022-04-06 DIAGNOSIS — M461 Sacroiliitis, not elsewhere classified: Secondary | ICD-10-CM | POA: Diagnosis not present

## 2022-04-08 ENCOUNTER — Other Ambulatory Visit: Payer: Self-pay | Admitting: Internal Medicine

## 2022-04-12 DIAGNOSIS — I10 Essential (primary) hypertension: Secondary | ICD-10-CM | POA: Diagnosis not present

## 2022-04-12 DIAGNOSIS — I2510 Atherosclerotic heart disease of native coronary artery without angina pectoris: Secondary | ICD-10-CM | POA: Diagnosis not present

## 2022-04-12 DIAGNOSIS — E7800 Pure hypercholesterolemia, unspecified: Secondary | ICD-10-CM | POA: Diagnosis not present

## 2022-04-12 DIAGNOSIS — G894 Chronic pain syndrome: Secondary | ICD-10-CM | POA: Diagnosis not present

## 2022-04-12 DIAGNOSIS — M159 Polyosteoarthritis, unspecified: Secondary | ICD-10-CM | POA: Diagnosis not present

## 2022-04-17 DIAGNOSIS — J9611 Chronic respiratory failure with hypoxia: Secondary | ICD-10-CM | POA: Diagnosis not present

## 2022-04-24 ENCOUNTER — Other Ambulatory Visit: Payer: Self-pay | Admitting: Cardiovascular Disease

## 2022-04-24 ENCOUNTER — Other Ambulatory Visit: Payer: Self-pay | Admitting: Internal Medicine

## 2022-04-24 DIAGNOSIS — E785 Hyperlipidemia, unspecified: Secondary | ICD-10-CM

## 2022-04-25 ENCOUNTER — Telehealth: Payer: Self-pay

## 2022-04-25 ENCOUNTER — Ambulatory Visit: Payer: Medicare HMO | Admitting: Nurse Practitioner

## 2022-04-25 ENCOUNTER — Encounter: Payer: Self-pay | Admitting: Nurse Practitioner

## 2022-04-25 ENCOUNTER — Other Ambulatory Visit (INDEPENDENT_AMBULATORY_CARE_PROVIDER_SITE_OTHER): Payer: Medicare HMO

## 2022-04-25 VITALS — BP 108/58 | HR 65 | Ht 71.0 in | Wt 265.0 lb

## 2022-04-25 DIAGNOSIS — Z7901 Long term (current) use of anticoagulants: Secondary | ICD-10-CM

## 2022-04-25 DIAGNOSIS — K8681 Exocrine pancreatic insufficiency: Secondary | ICD-10-CM | POA: Diagnosis not present

## 2022-04-25 DIAGNOSIS — D649 Anemia, unspecified: Secondary | ICD-10-CM

## 2022-04-25 DIAGNOSIS — Z86010 Personal history of colonic polyps: Secondary | ICD-10-CM

## 2022-04-25 LAB — CBC
HCT: 43 % (ref 39.0–52.0)
Hemoglobin: 14.8 g/dL (ref 13.0–17.0)
MCHC: 34.5 g/dL (ref 30.0–36.0)
MCV: 90.5 fl (ref 78.0–100.0)
Platelets: 238 10*3/uL (ref 150.0–400.0)
RBC: 4.75 Mil/uL (ref 4.22–5.81)
RDW: 14.6 % (ref 11.5–15.5)
WBC: 4.7 10*3/uL (ref 4.0–10.5)

## 2022-04-25 MED ORDER — NA SULFATE-K SULFATE-MG SULF 17.5-3.13-1.6 GM/177ML PO SOLN: 1.0000 | ORAL | 0 refills | Status: DC

## 2022-04-25 NOTE — Telephone Encounter (Signed)
Request for surgical clearance:     Endoscopy Procedure  What type of surgery is being performed?     06/20/22  When is this surgery scheduled?     Colonoscopy    What type of clearance is required ?   Pharmacy  Are there any medications that need to be held prior to surgery and how long? Prasugrel  X 5-7 days prior to colonoscopy.   Practice name and name of physician performing surgery?      [facility]  What is your office phone and fax number?      Phone- [phone]  [phone]  Anesthesia type (None, local, MAC, general) ?       MAC

## 2022-04-25 NOTE — Telephone Encounter (Signed)
   Patient Name: Travis [name]  DOB: [birth year] MRN: [id number]  Primary Cardiologist: Lauree Chandler, MD  Chart reviewed as part of pre-operative protocol coverage. Historically on Effient due to multiple prior PCIs, last cath 2013 with patent stents. In unrelated clearance request earlier this year, Dr. Angelena Form had granted permission to hold Effient. He was doing well at [place] 11/2021. I called patient to ensure no new changes from that visit. He affirms he is doing well without any new symptoms or concerns. Therefore the prior clearance from MD still stands given no interim issues. OK to hold Effient as requested prior to colonoscopy - typical duration of holding Effient is 7 days but will defer to GI to review and advise patient of final procedure instructions. Patient is aware to contact our office if any new issues arise prior to procedure.  Will route this bundled recommendation to requesting provider via Epic fax function. Please call with questions.  Charlie Pitter, PA-C 04/25/2022, 10:38 AM

## 2022-04-25 NOTE — Patient Instructions (Signed)
Your provider has requested that you go to the basement level for lab work before leaving today. Press "B" on the elevator. The lab is located at the first door on the left as you exit the elevator.  You have been scheduled for a colonoscopy. Please follow written instructions given to you at your visit today.  Please pick up your prep supplies at the pharmacy within the next 1-3 days. If you use inhalers (even only as needed), please bring them with you on the day of your procedure.  We have sent the following medications to your pharmacy for you to pick up at your convenience: [place] will be contaced by our office prior to your procedure for directions on holding your Prasugrel.  If you do not hear from our office 1 week prior to your scheduled procedure, please call [phone] to discuss.  Due to recent changes in healthcare laws, you may see the results of your imaging and laboratory studies on MyChart before your provider has had a chance to review them.  We understand that in some cases there may be results that are confusing or concerning to you. Not all laboratory results come back in the same time frame and the provider may be waiting for multiple results in order to interpret others.  Please give [place] 48 hours in order for your provider to thoroughly review all the results before contacting the office for clarification of your results.     If you are age 68 or older, your body mass index should be between 23-30. Your Body mass index is 36.96 kg/m. If this is out of the aforementioned range listed, please consider follow up with your Primary Care Provider.  If you are age 22 or younger, your body mass index should be between 19-25. Your Body mass index is 36.96 kg/m. If this is out of the aformentioned range listed, please consider follow up with your Primary Care Provider.   The [facility] providers would like to encourage you to use [facility] to communicate with providers for  non-urgent requests or questions.  Due to long hold times on the telephone, sending your provider a message by [facility] may be a faster and more efficient way to get a response.  Please allow 48 business hours for a response.  Please remember that this is for non-urgent requests.   Thank you for choosing me and [facility].  Tye Savoy NP

## 2022-04-25 NOTE — Progress Notes (Signed)
Agree with assessment and plan as outlined.  Travis [name], regarding Effient, just needs to be held 5 days prior to the procedure, not 7, if you can relay when requesting approval to hold it. Thanks
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
## 2022-04-27 ENCOUNTER — Telehealth: Payer: Self-pay

## 2022-04-27 ENCOUNTER — Telehealth: Payer: Self-pay | Admitting: *Deleted

## 2022-04-27 NOTE — Telephone Encounter (Signed)
-----   Message from Willia Craze, NP sent at 04/26/2022  3:23 PM EST ----- Mickel Baas,  Would you mind sending another letter to cardiology about holding effient . I had requested a 7 day hold but dr. Havery Moros says 5 days is adequate. Sorry, thanks

## 2022-04-27 NOTE — Telephone Encounter (Signed)
   Name: Travis [name]  DOB: Sep 16, 1950  MRN: [id number]  Primary Cardiologist: Lauree Chandler, MD   Preoperative team, please contact this patient and set up a phone call appointment for further preoperative risk assessment. Please obtain consent and complete medication review. Thank you for your help.  I confirm that guidance regarding antiplatelet and oral anticoagulation therapy has been completed and, if necessary, noted below.  Per office protocol, if patient is without any new symptoms or concerns at the time of their virtual visit, hemay hold Effient for 5 days prior to procedure. Please resume Effient as soon as possible postprocedure, at the discretion of the surgeon.    Lenna Sciara, NP 04/27/2022, 9:58 AM [place]

## 2022-04-27 NOTE — Telephone Encounter (Signed)
[facility] Pre-operative Risk Assessment     Request for surgical clearance:     Endoscopy Procedure  What type of surgery is being performed?     Colonoscopy   When is this surgery scheduled?     06/20/2022  What type of clearance is required ?   Pharmacy  Are there any medications that need to be held prior to surgery and how long? Effient -5  days  Practice name and name of physician performing surgery?      [facility]  What is your office phone and fax number?      Phone- [phone]  Fax646 [phone]  Anesthesia type (None, local, MAC, general) ?       MAC

## 2022-04-27 NOTE — Telephone Encounter (Signed)
See cardiac clearance request from 04/27/22

## 2022-04-27 NOTE — Telephone Encounter (Signed)
  Patient Consent for Virtual Visit         Travis LIPSETT Sr. has provided verbal consent on 04/27/2022 for a virtual visit (video or telephone).   CONSENT FOR VIRTUAL VISIT FOR:  Travis Novak Sr.  By participating in this virtual visit I agree to the following:  I hereby voluntarily request, consent and authorize [facility] and its employed or contracted physicians, physician assistants, nurse practitioners or other licensed health care professionals (the Practitioner), to provide me with telemedicine health care services (the "Services") as deemed necessary by the treating Practitioner. I acknowledge and consent to receive the Services by the Practitioner via telemedicine. I understand that the telemedicine visit will involve communicating with the Practitioner through live audiovisual communication technology and the disclosure of certain medical information by electronic transmission. I acknowledge that I have been given the opportunity to request an in-person assessment or other available alternative prior to the telemedicine visit and am voluntarily participating in the telemedicine visit.  I understand that I have the right to withhold or withdraw my consent to the use of telemedicine in the course of my care at any time, without affecting my right to future care or treatment, and that the Practitioner or I may terminate the telemedicine visit at any time. I understand that I have the right to inspect all information obtained and/or recorded in the course of the telemedicine visit and may receive copies of available information for a reasonable fee.  I understand that some of the potential risks of receiving the Services via telemedicine include:  Delay or interruption in medical evaluation due to technological equipment failure or disruption; Information transmitted may not be sufficient (e.g. poor resolution of images) to allow for appropriate medical decision making  by the Practitioner; and/or  In rare instances, security protocols could fail, causing a breach of personal health information.  Furthermore, I acknowledge that it is my responsibility to provide information about my medical history, conditions and care that is complete and accurate to the best of my ability. I acknowledge that Practitioner's advice, recommendations, and/or decision may be based on factors not within their control, such as incomplete or inaccurate data provided by me or distortions of diagnostic images or specimens that may result from electronic transmissions. I understand that the practice of medicine is not an exact science and that Practitioner makes no warranties or guarantees regarding treatment outcomes. I acknowledge that a copy of this consent can be made available to me via my patient portal ([place]), or I can request a printed copy by calling the office of [facility].    I understand that my insurance will be billed for this visit.   I have read or had this consent read to me. I understand the contents of this consent, which adequately explains the benefits and risks of the Services being provided via telemedicine.  I have been provided ample opportunity to ask questions regarding this consent and the Services and have had my questions answered to my satisfaction. I give my informed consent for the services to be provided through the use of telemedicine in my medical care

## 2022-05-01 ENCOUNTER — Ambulatory Visit: Payer: Medicare HMO

## 2022-05-01 NOTE — Telephone Encounter (Signed)
Pt has been rescheduled for tele pre op appt, see clearance notes

## 2022-05-01 NOTE — Telephone Encounter (Signed)
I s/w the pt and he is agreeable to reschedule tele pre op appt due to  provider not in the office.

## 2022-05-08 ENCOUNTER — Other Ambulatory Visit: Payer: Self-pay | Admitting: Internal Medicine

## 2022-05-23 ENCOUNTER — Ambulatory Visit: Payer: Medicare HMO

## 2022-05-23 ENCOUNTER — Ambulatory Visit: Payer: Medicare HMO | Attending: Cardiology | Admitting: Nurse Practitioner

## 2022-05-23 DIAGNOSIS — Z01810 Encounter for preprocedural cardiovascular examination: Secondary | ICD-10-CM
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
## 2022-05-24 ENCOUNTER — Telehealth: Payer: Self-pay

## 2022-05-24 NOTE — Telephone Encounter (Signed)
Key: [id number]

## 2022-05-26 ENCOUNTER — Telehealth: Payer: Self-pay | Admitting: Nurse Practitioner

## 2022-05-26 NOTE — Telephone Encounter (Signed)
Patient is currently taking 2 capsules of zenpep with each meal and 1 capsule with a snack. Pt reports a couple days after his appointment he started having loose greasy stools and pt wanted to know if he should increase his zenpep.

## 2022-05-26 NOTE — Telephone Encounter (Signed)
Inbound call from patient stating since the last time he was seen he has been having loose greasy stools and is requesting a call back to discuss if he needs to increase Zenpep. Please advise.

## 2022-05-30 NOTE — Telephone Encounter (Signed)
Yes absolutely based on his weight he can certainly increase his dose of Zenpep.  He can initially try 3 caps with each meal and 2 caps per snack and see how that goes.  If it does not help he can increase to 4 caps with each meal.  Thanks

## 2022-05-31 ENCOUNTER — Other Ambulatory Visit: Payer: Self-pay

## 2022-05-31 MED ORDER — ZENPEP 40000-126000 UNITS PO CPEP: ORAL_CAPSULE | ORAL | 11 refills | Status: DC

## 2022-05-31 NOTE — Telephone Encounter (Signed)
Called pt and gave pt recommendations. Pt verbalized understanding and requested that a new prescription be sent to pharmacy. New zenpep prescription sent to pharmacy.

## 2022-06-03 ENCOUNTER — Other Ambulatory Visit (HOSPITAL_COMMUNITY): Payer: Self-pay | Admitting: Psychiatry

## 2022-06-03 DIAGNOSIS — F319 Bipolar disorder, unspecified: Secondary | ICD-10-CM

## 2022-06-03 DIAGNOSIS — F419 Anxiety disorder, unspecified: Secondary | ICD-10-CM

## 2022-06-05 ENCOUNTER — Telehealth: Payer: Self-pay | Admitting: Internal Medicine

## 2022-06-05 NOTE — Telephone Encounter (Signed)
[place] called and said that patient's testosterone shots need a prior authorization.  Please fax to [phone]

## 2022-06-06 ENCOUNTER — Other Ambulatory Visit (HOSPITAL_COMMUNITY): Payer: Self-pay

## 2022-06-06 NOTE — Telephone Encounter (Signed)
Tried to submit PA on CMM, [facility] was not able to process the request because the previous Prior Authorization Request was Denied. PA was submitted on 05/24/22 with Key: [id number]. It was denied due to not meeting the requirement(s) of a medically-accepted indication (Diagnosis of chronic pain syndrome was used).  E-appeal is available (Key: [id number])  Please be advised we currently do not have a Pharmacist to review denials. If you would like [place] to submit it on your behalf, please provide clinical information to support your reason for appeal and any pertinent information you would like [place] to include with the appeal request. Appeals may take longer 5 business days to be submitted as we prepares necessary documentation. Thanks for your support.  How would you like to proceed?

## 2022-06-15 NOTE — Telephone Encounter (Signed)
Thanks

## 2022-06-19 ENCOUNTER — Ambulatory Visit (INDEPENDENT_AMBULATORY_CARE_PROVIDER_SITE_OTHER): Payer: Medicare HMO | Admitting: Orthopedic Surgery

## 2022-06-19 ENCOUNTER — Encounter: Payer: Self-pay | Admitting: Orthopedic Surgery

## 2022-06-19 ENCOUNTER — Ambulatory Visit: Payer: Self-pay

## 2022-06-19 VITALS — BP 121/74 | HR 66 | Ht 71.0 in | Wt 265.0 lb

## 2022-06-19 DIAGNOSIS — Z981 Arthrodesis status: Secondary | ICD-10-CM

## 2022-06-19 DIAGNOSIS — M4316 Spondylolisthesis, lumbar region: Secondary | ICD-10-CM

## 2022-06-19 NOTE — Progress Notes (Signed)
Orthopedic Spine Surgery Office Note  Assessment: Patient is a 72 y.o. male with bilateral posterior hip pain. Has long lumbar fusion, provocative maneuvers consistent with SI joint pain, and good relief with injections. Seems to have bilateral SI joint pain   Plan: -Explained that initially conservative treatment is tried as a significant number of patients may experience relief with these treatment modalities. Discussed that the conservative treatments include:  -activity modification  -physical therapy  -over the counter pain medications  -medrol dosepak  -lumbar steroid injections -Patient has tried tylenol, oral steroids, activity modification, steroid injections (12), pain management  -Explained that I do not do SI fusion, so referred him to [facility] since they perform this procedure. He also has fibromyalgia which NASS recommends absence of when considering SI fusion -He is asymptomatic from his possible pseudarthrosis so no intervention at this time -Patient should return to office on an as needed basis    Patient expressed understanding of the plan and all questions were answered to the patient's satisfaction.   ___________________________________________________________________________   History:  Patient is a 72 y.o. male who presents today for lumbar spine. Patient has a history of L1-S1 fusion with Dr. Louanne Skye. Has been in pain management for his chronic pain. Has developed over the years after his fusion, pain in his bilateral posterior hips. Feels it on a daily basis and it has been getting progressively worse. Hydrocodone is no longer helping. Has had greater than 70% relief with injections to the SI joints with pain management, but it only last a months or two. Has had this pain 8 or 9 months now. No pain radiating down the legs.    Weakness: yes, low back. Denies other weakness Symptoms of imbalance: denies Paresthesias and numbness: denies Bowel or bladder  incontinence: denies Saddle anesthesia: denies  Treatments tried: tylenol, oral steroids, activity modification, steroid injections (12), pain management   Review of systems: Denies fevers and chills, night sweats, unexplained weight loss, history of cancer. Has had pain that wakes him at night   Past medical history: Fibromyalgia HTN Depression Anxiety CAD MI Chronic pain Sleep apnea DM Urethral stricture Allergic rhinitis Angina Osteoarthritis GERD Vitamin D deficiency  Allergies: Buprenorphine  Past surgical history (39): R BKA Bilateral THA Bilateral TKA L1-S1 PSIF Polypectomy R ankle fusion C3/4 ACDF Carpal tunnel release TN fusion Knee arthroscopy x3 ORIF ankle fracture Rotator cuff repair x4 Revision THA Vasectomy Urethral dilation  Social history: Denies use of nicotine product (smoking, vaping, patches, smokeless) Alcohol use: denies Denies recreational drug use   Physical Exam:  General: no acute distress, appears stated age Neurologic: alert, answering questions appropriately, following commands Respiratory: unlabored breathing on room air, symmetric chest rise Psychiatric: appropriate affect, normal cadence to speech   MSK (spine):  -Strength exam      Left  Right EHL    -/5  4/5 TA    -/5  5/5 GSC    -/5  5/5 Knee extension  5/5  5/5 Hip flexion   5/5  5/5  Unable to distal motor strength testing since right BKA  -Sensory exam    Sensation intact to light touch in L3-S1 nerve distributions of bilateral lower extremities  -Patellar tendon DTR: 1/4 on the left, 0/4 on the right  -Straight leg raise: negative  -Contralateral straight leg raise: negative -Clonus: no beats bilaterally  -Left hip exam: no pain through range of motion, negative stinchfield, positive faber, positive SI joint compression test, positive gaenslens -Right hip exam:  no pain through range of motion, negative stinchfield, positive faber, positive SI  joint compression test, positive gaenslens  -TTP diffusely over the posterior hips, no other tenderness to palpation  Imaging: XR of the lumbar spine from 06/16/2022 was independently reviewed and interpreted, showing instrumentation in place from L1-S1. Instrumentation appears to be in stable position. One L1 screw appears to violate the superior end plate. No lucency around the screws. No screws backing out.   CT of the ab/pelvis spine from 11/26/2021 was independently reviewed and interpreted, showing lucency around the L1 screw with no fusion mass spanning the L1/2 segments. Fusion mass seen spanning from L2-S1. No other instrumentation complication seen. No fractures seen.    Patient name: Travis [name] Patient MRN: [id number] Date of visit: 06/19/22

## 2022-06-20 ENCOUNTER — Encounter: Payer: Medicare HMO | Admitting: Gastroenterology

## 2022-06-28 ENCOUNTER — Encounter (HOSPITAL_COMMUNITY): Payer: Self-pay | Admitting: Psychiatry

## 2022-06-28 ENCOUNTER — Telehealth (HOSPITAL_BASED_OUTPATIENT_CLINIC_OR_DEPARTMENT_OTHER): Payer: Medicare HMO | Admitting: Psychiatry

## 2022-06-28 DIAGNOSIS — F419 Anxiety disorder, unspecified: Secondary | ICD-10-CM | POA: Diagnosis not present

## 2022-06-28 DIAGNOSIS — F319 Bipolar disorder, unspecified: Secondary | ICD-10-CM

## 2022-06-28 MED ORDER — DULOXETINE HCL 30 MG PO CPEP: 30.0000 mg | ORAL_CAPSULE | Freq: Two times a day (BID) | ORAL | 0 refills | Status: DC

## 2022-06-28 MED ORDER — TRAZODONE HCL 150 MG PO TABS: 150.0000 mg | ORAL_TABLET | Freq: Every day | ORAL | 0 refills | Status: AC

## 2022-06-28 MED ORDER — ARIPIPRAZOLE 10 MG PO TABS: 10.0000 mg | ORAL_TABLET | Freq: Every day | ORAL | 0 refills | Status: DC

## 2022-06-28 NOTE — Progress Notes (Signed)
Virtual Visit via Video Note  I connected with Travis Novak Sr. on 06/28/22 at 10:00 AM EST by a video enabled telemedicine application and verified that I am speaking with the correct person using two identifiers.  Location: Patient: Home Provider: Home Office   I discussed the limitations of evaluation and management by telemedicine and the availability of in person appointments. The patient expressed understanding and agreed to proceed.  History of Present Illness: Patient is evaluated by video session.  He is taking Cymbalta 30 mg 2 times a day along with Abilify and trazodone.  His pain doctor is retired and now he is seen a new pain management at [facility].  Recently his pain medicines were adjusted and he is taking now higher dose of Belbuca.  He is also taking tizanidine as metacarpal call did not approve by his insurance.  Patient is still has a flareup of severe pain and surgery still be an option if maximum pain optimization did not work for him.  He reported things are okay and he denies any agitation, anger, mania, psychosis or any hallucination.  He is able to sleep 4 to 5 hours.  He denies any panic attack.  Denies any side effects of the medication.  He lives by himself but had a good support from his neighbors.  He gets a ride from [employer] for the [facility] appointment.  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  His appetite is okay.  His weight is stable.  He is taking moderate pain medication and denies drinking or using any illegal substances.    Past Psychiatric History:  Saw psychiatrist in [place] and [place].  H/O mood swings, suicidal gesture, anger, hallucination, irritability and increase spending. Married 5 times. H/O inpatient in 2014 due to overdose on Xanax and alcohol.  Did IOP. Took Effexor, Xanax, Klonopin, Ambien, Lunesta, Wellbutrin, Ambien, Remeron and Prozac. Abilify  started in our office.    Psychiatric Specialty Exam: Physical Exam  Review of Systems  Musculoskeletal:  Positive for back pain.    Weight 250 lb (113.4 kg).There is no height or weight on file to calculate BMI.  General Appearance: Casual  Eye Contact:  Good  Speech:  Normal Rate  Volume:  Normal  Mood:  Euthymic  Affect:  Congruent  Thought Process:  Goal Directed  Orientation:  Full (Time, Place, and Person)  Thought Content:  Logical  Suicidal Thoughts:  No  Homicidal Thoughts:  No  Memory:  Immediate;   Good Recent;   Good Remote;   Good  Judgement:  Intact  Insight:  Present  Psychomotor Activity:  Normal  Concentration:  Concentration: Fair and Attention Span: Fair  Recall:  Good  Fund of Knowledge:  Good  Language:  Good  Akathisia:  No  Handed:  Right  AIMS (if indicated):     Assets:  Communication Skills Desire for Improvement Housing Social Support  ADL's:  Intact  Cognition:  WNL  Sleep:   6-6 hrs     Assessment and Plan: Bipolar disorder type I.  Anxiety.  Patient is stable on his current medication.  His symptoms are chronic but manageable.  He does not want to change the medication.  Discussed polypharmacy as patient taking multiple pain medication and muscle relaxant.  Patient understand the risk of multiple medication but also reported his symptoms are manageable.  Like to keep  his current medication.  Continue Cymbalta 30 mg 2 times a day, Abilify 10 mg daily and trazodone 150 mg at bedtime.  Recommended to call us back if is any question or any concern.  Follow-up in 3 months.  Follow Up Instructions:    I discussed the assessment and treatment plan with the patient. The patient was provided an opportunity to ask questions and all were answered. The patient agreed with the plan and demonstrated an understanding of the instructions.   The patient was advised to call back or seek an in-person evaluation if the symptoms worsen or if the condition  fails to improve as anticipated.  Collaboration of Care: Other provider involved in patient's care AEB notes are available in epic to review.  Patient/Guardian was advised Release of Information must be obtained prior to any record release in order to collaborate their care with an outside provider. Patient/Guardian was advised if they have not already done so to contact the registration department to sign all necessary forms in order for [place] to release information regarding their care.   Consent: Patient/Guardian gives verbal consent for treatment and assignment of benefits for services provided during this visit. Patient/Guardian expressed understanding and agreed to proceed.    I provided 20 minutes of non-face-to-face time during this encounter.   Kathlee Nations, MD

## 2022-07-27 ENCOUNTER — Telehealth: Payer: Self-pay | Admitting: Cardiovascular Disease

## 2022-07-27 NOTE — Telephone Encounter (Signed)
Pt c/o Shortness Of Breath: STAT if SOB developed within the last 24 hours or pt is noticeably SOB on the phone  1. Are you currently SOB (can you hear that pt is SOB on the phone)?   No  2. How long have you been experiencing SOB?   About 3 weeks ago  3. Are you SOB when sitting or when up moving around?      4. Are you currently experiencing any other symptoms?     Patient states after he eats he breaks out in a sweat and his face and jaw gets tight and face goes numb.  Patient ended call to take another call.

## 2022-07-27 NOTE — Telephone Encounter (Signed)
Called back to patient.  His PCP makes house calls and is there now.  Per patient BP is 112/64, EKG is good and they figured out that he is out of testosterone and the last time he ran out of it he had the same symptoms.  So he will be refilling this and expects to be feeling back to normal.  He needs an appointment for July with Dr. Angelena Form.  I will schedule and add him to wait list.  Pt appreciative for the call back.

## 2022-07-28 ENCOUNTER — Other Ambulatory Visit: Payer: Self-pay | Admitting: *Deleted

## 2022-08-04 DIAGNOSIS — M961 Postlaminectomy syndrome, not elsewhere classified: Secondary | ICD-10-CM | POA: Insufficient documentation

## 2022-08-07 ENCOUNTER — Other Ambulatory Visit: Payer: Self-pay | Admitting: Internal Medicine

## 2022-09-05 ENCOUNTER — Encounter: Payer: Self-pay | Admitting: Physician Assistant

## 2022-09-05 ENCOUNTER — Ambulatory Visit (INDEPENDENT_AMBULATORY_CARE_PROVIDER_SITE_OTHER): Payer: Medicare HMO | Admitting: Physician Assistant

## 2022-09-05 ENCOUNTER — Other Ambulatory Visit: Payer: Self-pay

## 2022-09-05 DIAGNOSIS — M25561 Pain in right knee: Secondary | ICD-10-CM
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
## 2022-09-18 ENCOUNTER — Ambulatory Visit: Payer: Medicare HMO | Admitting: Physical Therapy

## 2022-09-25 ENCOUNTER — Encounter: Payer: Self-pay | Admitting: Physical Therapy

## 2022-09-25 ENCOUNTER — Ambulatory Visit (INDEPENDENT_AMBULATORY_CARE_PROVIDER_SITE_OTHER): Payer: Medicare HMO | Admitting: Physical Therapy

## 2022-09-25 ENCOUNTER — Ambulatory Visit: Payer: Medicare HMO | Admitting: Physical Therapy

## 2022-09-25 ENCOUNTER — Other Ambulatory Visit: Payer: Self-pay

## 2022-09-25 DIAGNOSIS — M25561 Pain in right knee: Secondary | ICD-10-CM

## 2022-09-25 DIAGNOSIS — M6281 Muscle weakness (generalized): Secondary | ICD-10-CM

## 2022-09-25 DIAGNOSIS — R262 Difficulty in walking, not elsewhere classified: Secondary | ICD-10-CM | POA: Diagnosis not present
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
## 2022-09-26 ENCOUNTER — Telehealth (HOSPITAL_COMMUNITY): Payer: Medicare HMO | Admitting: Psychiatry

## 2022-09-28 ENCOUNTER — Encounter (HOSPITAL_COMMUNITY): Payer: Self-pay | Admitting: *Deleted

## 2022-10-02 ENCOUNTER — Telehealth: Payer: Self-pay | Admitting: Orthopedic Surgery

## 2022-10-02 NOTE — Telephone Encounter (Signed)
Pt informed

## 2022-10-02 NOTE — Telephone Encounter (Signed)
Pt called and states he seen PA Persons 4/16 but need scripts from Dr Lajoyce Corners that PA Persons told him to ask Dr Lajoyce Corners for. Pt need liners for right side and new bucket. Please call pt at [phone].

## 2022-10-02 NOTE — Telephone Encounter (Signed)
[facility]ed [facility] 

## 2022-10-03 ENCOUNTER — Encounter: Payer: Medicare HMO | Admitting: Physical Therapy

## 2022-10-10 ENCOUNTER — Encounter: Payer: Medicare HMO | Admitting: Physical Therapy

## 2022-10-12 ENCOUNTER — Encounter: Payer: Self-pay | Admitting: Orthopedic Surgery

## 2022-10-12 ENCOUNTER — Ambulatory Visit (INDEPENDENT_AMBULATORY_CARE_PROVIDER_SITE_OTHER): Payer: Medicare HMO | Admitting: Orthopedic Surgery

## 2022-10-12 DIAGNOSIS — Z89511 Acquired absence of right leg below knee: Secondary | ICD-10-CM | POA: Diagnosis not present
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
## 2022-10-17 ENCOUNTER — Encounter: Payer: Medicare HMO | Admitting: Physical Therapy

## 2022-10-20 ENCOUNTER — Other Ambulatory Visit: Payer: Self-pay | Admitting: Cardiovascular Disease

## 2022-10-20 DIAGNOSIS — E785 Hyperlipidemia, unspecified: Secondary | ICD-10-CM

## 2022-10-23 ENCOUNTER — Encounter: Payer: Medicare HMO | Admitting: Physical Therapy

## 2022-10-30 ENCOUNTER — Encounter: Payer: Medicare HMO | Admitting: Physical Therapy

## 2022-10-31 ENCOUNTER — Encounter: Payer: Medicare HMO | Admitting: Physical Therapy

## 2022-11-01 ENCOUNTER — Encounter: Payer: Self-pay | Admitting: Cardiovascular Disease

## 2022-11-02 ENCOUNTER — Other Ambulatory Visit: Payer: Self-pay | Admitting: Cardiovascular Disease

## 2022-11-07 ENCOUNTER — Other Ambulatory Visit (HOSPITAL_COMMUNITY): Payer: Self-pay | Admitting: Psychiatry

## 2022-11-07 DIAGNOSIS — F419 Anxiety disorder, unspecified: Secondary | ICD-10-CM

## 2022-11-07 DIAGNOSIS — F319 Bipolar disorder, unspecified: Secondary | ICD-10-CM

## 2022-11-24 ENCOUNTER — Other Ambulatory Visit: Payer: Self-pay | Admitting: Internal Medicine

## 2022-12-13 ENCOUNTER — Encounter: Payer: Self-pay | Admitting: Family

## 2022-12-13 ENCOUNTER — Other Ambulatory Visit (INDEPENDENT_AMBULATORY_CARE_PROVIDER_SITE_OTHER): Payer: Medicare HMO

## 2022-12-13 ENCOUNTER — Ambulatory Visit: Payer: Medicare HMO | Admitting: Family

## 2022-12-13 DIAGNOSIS — M25562 Pain in left knee: Secondary | ICD-10-CM | POA: Diagnosis not present

## 2022-12-13 DIAGNOSIS — G8929 Other chronic pain: Secondary | ICD-10-CM

## 2022-12-13 DIAGNOSIS — S83002A Unspecified subluxation of left patella, initial encounter: Secondary | ICD-10-CM
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
## 2022-12-26 ENCOUNTER — Other Ambulatory Visit (HOSPITAL_COMMUNITY): Payer: Self-pay | Admitting: Psychiatry

## 2022-12-26 DIAGNOSIS — F319 Bipolar disorder, unspecified: Secondary | ICD-10-CM

## 2022-12-26 DIAGNOSIS — F419 Anxiety disorder, unspecified: Secondary | ICD-10-CM

## 2023-01-01 ENCOUNTER — Telehealth: Payer: Self-pay | Admitting: *Deleted

## 2023-01-01 NOTE — Telephone Encounter (Signed)
   Pre-operative Risk Assessment    Patient Name: Travis [name]  DOB: 1950/11/13 MRN: [id number]      Request for Surgical Clearance    Procedure:   Sacroiliac Joint Fusion  Date of Surgery:  Clearance TBD                                 Surgeon:  Dr. Charlean Merl Group or Practice Name:  [facility] Phone number:  [phone] Fax number:  [phone]   Type of Clearance Requested:   - Medical  - Pharmacy:  Hold Aspirin and Prasugrel (Effient) 10 days prior and resume 24 hours after.    Type of Anesthesia:  Not Indicated   Additional requests/questions:    Signed, Emmit Pomfret   01/01/2023, 2:08 PM

## 2023-01-01 NOTE — Telephone Encounter (Signed)
   Name: Travis [name]  DOB: Aug 16, 1950  MRN: [id number]  Primary Cardiologist: Verne Carrow, MD  Chart reviewed as part of pre-operative protocol coverage. Because of Travis DEFREES Sr.'s past medical history and time since last visit, he will require a follow-up in-office visit in order to better assess preoperative cardiovascular risk.  Pre-op covering staff: - Please schedule appointment and call patient to inform them. If patient already had an upcoming appointment within acceptable timeframe, please add "pre-op clearance" to the appointment notes so provider is aware. - Please contact requesting surgeon's office via preferred method (i.e, phone, fax) to inform them of need for appointment prior to surgery.  Will send a message Dr. Clifton James regarding a 10-day Effient hold.  Sharlene Dory, PA-C  01/01/2023, 4:28 PM

## 2023-01-02 NOTE — Telephone Encounter (Signed)
S/w the pt and he has been scheduled for in office appt with Jari Favre, [facility] 01/12/23 @ 8:50. Pt thanked me for the call and the help. I will update all parties involved.

## 2023-01-03 ENCOUNTER — Ambulatory Visit
Admission: RE | Admit: 2023-01-03 | Discharge: 2023-01-03 | Disposition: A | Discharge status: Home / Self Care | Payer: Medicare HMO | Source: Ambulatory Visit | Attending: Family | Admitting: Family

## 2023-01-03 DIAGNOSIS — G8929 Other chronic pain: Secondary | ICD-10-CM

## 2023-01-11 NOTE — Progress Notes (Signed)
Cardiology Office Note:  .   Date:  01/12/2023  ID:  Travis Foil Sr., DOB 1950/05/24, MRN [id number] PCP: System, Provider Not In  [facility] Providers Cardiologist:  Verne Carrow, MD {  History of Present Illness: .   Travis MACLELLAN Sr. is a 72 y.o. male with a past medical history of CAD status post multiple PCI's, depression, anxiety, HTN, HLD, spinal stenosis and GERD who is here for follow-up appointment.  He was seen back in January by Robin Searing, NP for preop clearance.  He was doing well at that time.  He was cleared to hold his Effient for 7 days but ultimately was referred to GI.  He is here today for preop clearance.  Set up to have sacroiliac joint fusion.  He tells me that about a week ago he felt like he was punched in the center of the chest while getting up to do the dishes.  Not exerting himself.  This led to incontinence of stool and urine.  He states this happened to his father twice in his lifetime.  His anginal equivalent is diaphoresis, shortness of breath, and chest pains with jaw pain.  He has not experienced this in 15 years.  Has 9 stents total per the patient.  He states that he did not have any shortness of breath or palpitations or diaphoresis during this episode.  He tells me that he takes Cialis and Viagra at the same time and has been for years.  His primary care used to fill both for him but recently he is only feeling Cialis.  He is asked [place] to fill his Viagra 50 mg as needed for ED.  His blood pressure is elevated today but he tells me he is in a lot of pain.  His shoulder also needs replaced.  Having knee issues as well.  Lots of back pain today.  Has not slept well.  For his condition, he does a lot of physical activity including dancing on the weekends and going to the gym 3 to 4 days a week.  He does light weight high reps and is able to climb the steps.   He is able to hold his Effient for 5-7 days. Hold aspirin for 7 days.   Please resume when medically safe to do so.  Reports no shortness of breath nor dyspnea on exertion. Reports no chest pain, pressure, or tightness. No edema, orthopnea, PND. Reports no palpitations.    ROS: Pertinent ROS in HPI  Studies Reviewed: [name]   EKG Interpretation Date/Time:  Friday January 12 2023 08:38:44 EDT Ventricular Rate:  61 PR Interval:  160 QRS Duration:  104 QT Interval:  378 QTC Calculation: 380 R Axis:   8  Text Interpretation: Normal sinus rhythm Normal ECG When compared with ECG of 17-Aug-2018 06:56, No significant change was found Confirmed by Jari Favre [phone]) on 01/12/2023 8:52:53 AM    Echo October 2022:  1. Left ventricular ejection fraction, by estimation, is 65 to 70%. The  left ventricle has normal function. The left ventricle has no regional  wall motion abnormalities. Left ventricular diastolic parameters were  normal.   2. Right ventricular systolic function is normal. The right ventricular  size is normal.   3. The mitral valve is grossly normal. No evidence of mitral valve  regurgitation.   4. The aortic valve is grossly normal. Aortic valve regurgitation is not  visualized. No aortic stenosis is present.   5. Aortic dilatation noted.  There is mild dilatation of the ascending  aorta, measuring 41 mm.  Physical Exam:   VS:  BP (!) 150/82   Pulse 61   Ht 6\' 1"  (1.854 m)   Wt 287 lb 12.8 oz (130.5 kg)   SpO2 96%   BMI 37.97 kg/m    Wt Readings from Last 3 Encounters:  01/12/23 287 lb 12.8 oz (130.5 kg)  06/19/22 265 lb (120.2 kg)  04/25/22 265 lb (120.2 kg)    GEN: Well nourished, well developed in no acute distress NECK: No JVD; No carotid bruits CARDIAC: RRR, no murmurs, rubs, gallops RESPIRATORY:  Clear to auscultation without rales, wheezing or rhonchi  ABDOMEN: Soft, non-tender, non-distended EXTREMITIES:  No edema; No deformity   ASSESSMENT AND PLAN: .   1.  Preop Clearance  Travis [name] perioperative risk of a major  cardiac event is 6.6% according to the Revised Cardiac Risk Index (RCRI).  Therefore, he is at high risk for perioperative complications.   His functional capacity is good at 5.47 METs according to the Duke Activity Status Index (DASI). Recommendations: According to ACC/AHA guidelines, no further cardiovascular testing needed.  The patient may proceed to surgery at acceptable risk.   Antiplatelet and/or Anticoagulation Recommendations: Aspirin can be held for 7 days prior to his surgery.  Please resume Aspirin post operatively when it is felt to be safe from a bleeding standpoint.  Prasugrel (Effient) can be held for 5-7 days prior to his surgery and resumed as soon as possible post op.   2. CAD without angina -Had an episode where he felt like he had a blow to the chest and then had incontinence of urine. -Symptoms are not suggestive of his anginal equivalent pain when he had his multiple stents placed -Would not pursue any ischemic workup at this time  2.  Chronic diastolic CHF -Euvolemic on exam today -Continue current medications  3.  HTN -Blood pressure slightly elevated but patient is in pain today -Recommend continue low-sodium, heart healthy diet -Recommend that he continue to monitor blood pressure at home  4.  Morbid obesity -He is able to go to the gym a few days a week and do light weights high reps -Continue cardiovascular exercise as tolerated  5.  HLD -LDL 60, at goal (05/2022) -Continue current medications -Will be due for updated lipid panel January 2025      Dispo: He can follow-up in 6 months with Dr. Clifton James  Signed, Sharlene Dory, PA-C

## 2023-01-12 ENCOUNTER — Encounter: Payer: Self-pay | Admitting: Family

## 2023-01-12 ENCOUNTER — Ambulatory Visit: Payer: Medicare HMO | Attending: Physician Assistant | Admitting: Physician Assistant

## 2023-01-12 ENCOUNTER — Encounter: Payer: Self-pay | Admitting: Physician Assistant

## 2023-01-12 ENCOUNTER — Ambulatory Visit: Payer: Medicare HMO | Admitting: Family

## 2023-01-12 VITALS — BP 150/82 | HR 61 | Ht 73.0 in | Wt 287.8 lb

## 2023-01-12 DIAGNOSIS — I2510 Atherosclerotic heart disease of native coronary artery without angina pectoris: Secondary | ICD-10-CM | POA: Diagnosis not present

## 2023-01-12 DIAGNOSIS — I10 Essential (primary) hypertension: Secondary | ICD-10-CM

## 2023-01-12 DIAGNOSIS — I5032 Chronic diastolic (congestive) heart failure: Secondary | ICD-10-CM

## 2023-01-12 DIAGNOSIS — Z01810 Encounter for preprocedural cardiovascular examination: Secondary | ICD-10-CM

## 2023-01-12 DIAGNOSIS — M25362 Other instability, left knee: Secondary | ICD-10-CM

## 2023-01-12 MED ORDER — SILDENAFIL CITRATE 50 MG PO TABS: 50.0000 mg | ORAL_TABLET | Freq: Every day | ORAL | 0 refills | Status: DC | PRN
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
## 2023-01-12 NOTE — Patient Instructions (Addendum)
Medication Instructions:  DO NOT TAKE CIALIS AND VIAGRA TOGETHER BECAUSE IT CAN CAUSE LOW BLOOD PRESSURE, WHICH CAN INCREASE HEART ATTACK OR STROKE, Fainting, Priapism (when an erection won't go away -- an emergency condition, Chest pain, Indigestion, Back pain, Stuffy nose, Dizziness *If you need a refill on your cardiac medications before your next appointment, please call your pharmacy*   Lab Work: NONE ORDERED   Testing/Procedures: NONE ORDERED   Follow-Up: At [facility], you and your health needs are our priority.  As part of our continuing mission to provide you with exceptional heart care, we have created designated Provider Care Teams.  These Care Teams include your primary Cardiologist (physician) and Advanced Practice Providers (APPs -  Physician Assistants and Nurse Practitioners) who all work together to provide you with the care you need, when you need it.  We recommend signing up for the patient portal called "MyChart".  Sign up information is provided on this After Visit Summary.  MyChart is used to connect with patients for Virtual Visits (Telemedicine).  Patients are able to view lab/test results, encounter notes, upcoming appointments, etc.  Non-urgent messages can be sent to your provider as well.   To learn more about what you can do with MyChart, go to [email or web address].    Your next appointment:   6 month(s)  Provider:   Verne Carrow, MD     Other Instructions

## 2023-01-16 ENCOUNTER — Other Ambulatory Visit: Payer: Self-pay

## 2023-01-16 MED ORDER — SILDENAFIL CITRATE 50 MG PO TABS: 50.0000 mg | ORAL_TABLET | Freq: Every day | ORAL | 0 refills | Status: AC | PRN

## 2023-01-16 NOTE — Telephone Encounter (Signed)
Pt calling requesting that his medication sildenafil be sent to a different pharmacy. Pt's medication was resent to pt's preferred pharmacy. Confirmation received.

## 2023-01-17 ENCOUNTER — Other Ambulatory Visit (INDEPENDENT_AMBULATORY_CARE_PROVIDER_SITE_OTHER): Payer: Medicare HMO

## 2023-01-17 ENCOUNTER — Ambulatory Visit (INDEPENDENT_AMBULATORY_CARE_PROVIDER_SITE_OTHER): Payer: Medicare HMO | Admitting: Orthopedic Surgery

## 2023-01-17 ENCOUNTER — Encounter: Payer: Self-pay | Admitting: Orthopedic Surgery

## 2023-01-17 DIAGNOSIS — M25511 Pain in right shoulder: Secondary | ICD-10-CM | POA: Diagnosis not present
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
## 2023-01-19 ENCOUNTER — Ambulatory Visit: Payer: Medicare HMO | Admitting: Cardiovascular Disease

## 2023-01-25 ENCOUNTER — Ambulatory Visit (INDEPENDENT_AMBULATORY_CARE_PROVIDER_SITE_OTHER): Payer: Medicare HMO | Admitting: Orthopedic Surgery

## 2023-01-25 ENCOUNTER — Encounter: Payer: Self-pay | Admitting: Orthopedic Surgery

## 2023-01-25 DIAGNOSIS — M25362 Other instability, left knee: Secondary | ICD-10-CM
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
## 2023-01-26 ENCOUNTER — Other Ambulatory Visit: Payer: Self-pay

## 2023-01-26 ENCOUNTER — Telehealth: Payer: Self-pay | Admitting: Family

## 2023-01-26 MED ORDER — SILVER SULFADIAZINE 1 % EX CREA: 1.0000 | TOPICAL_CREAM | Freq: Every day | CUTANEOUS | 0 refills | Status: AC

## 2023-01-26 NOTE — Telephone Encounter (Signed)
Pt called requesting a refill of SSD ointment. Please send to [facility] pharmacy. Pt phone number at [phone].

## 2023-01-26 NOTE — Telephone Encounter (Signed)
Called pt and he states that he has a small open area on his residual limb and has treated with silvadene in past and this works. Declined to make an appt wants to treat with silvadene and advised pt that is anything changes increase in size, temp, redness etc to call for appt. Voiced understanding ok per MD for refill.

## 2023-01-29 ENCOUNTER — Other Ambulatory Visit: Payer: Self-pay | Admitting: Cardiovascular Disease

## 2023-02-05 ENCOUNTER — Ambulatory Visit
Admission: RE | Admit: 2023-02-05 | Discharge: 2023-02-05 | Disposition: A | Discharge status: Home / Self Care | Payer: Medicare HMO | Source: Ambulatory Visit | Attending: Orthopedic Surgery | Admitting: Orthopedic Surgery

## 2023-02-05 DIAGNOSIS — M25511 Pain in right shoulder: Secondary | ICD-10-CM

## 2023-02-07 ENCOUNTER — Other Ambulatory Visit: Payer: Self-pay | Admitting: Internal Medicine

## 2023-02-12 ENCOUNTER — Ambulatory Visit: Payer: Medicare HMO

## 2023-02-14 ENCOUNTER — Ambulatory Visit: Payer: Medicare HMO | Admitting: Orthopedic Surgery

## 2023-02-19 ENCOUNTER — Telehealth: Payer: Self-pay

## 2023-02-19 NOTE — Telephone Encounter (Signed)
-----   Message from Burnard Bunting sent at 02/17/2023  3:08 PM EDT ----- Travis [name] is he scheduled yet? ----- Message ----- From: Interface, Rad Results In Sent: 02/17/2023  10:51 AM EDT To: Cammy Copa, MD

## 2023-02-19 NOTE — Telephone Encounter (Signed)
See note from Dr August Saucer. He is asking if patient has been scheduled for surgery yet?

## 2023-02-26 ENCOUNTER — Ambulatory Visit: Payer: Medicare HMO | Admitting: Orthopedic Surgery

## 2023-03-07 ENCOUNTER — Ambulatory Visit: Payer: Medicare HMO | Admitting: Orthopedic Surgery

## 2023-03-08 ENCOUNTER — Other Ambulatory Visit: Payer: Self-pay | Admitting: Cardiovascular Disease

## 2023-03-08 DIAGNOSIS — E785 Hyperlipidemia, unspecified: Secondary | ICD-10-CM

## 2023-03-22 ENCOUNTER — Other Ambulatory Visit: Payer: Self-pay | Admitting: Internal Medicine

## 2023-04-11 ENCOUNTER — Ambulatory Visit (INDEPENDENT_AMBULATORY_CARE_PROVIDER_SITE_OTHER): Payer: Medicare HMO | Admitting: Podiatry

## 2023-04-11 DIAGNOSIS — Z91199 Patient's noncompliance with other medical treatment and regimen due to unspecified reason: Secondary | ICD-10-CM

## 2023-04-11 NOTE — Progress Notes (Signed)
No show

## 2023-04-23 ENCOUNTER — Other Ambulatory Visit: Payer: Self-pay | Admitting: Internal Medicine

## 2023-04-30 ENCOUNTER — Other Ambulatory Visit: Payer: Self-pay | Admitting: Cardiovascular Disease

## 2023-05-06 ENCOUNTER — Emergency Department (HOSPITAL_COMMUNITY)
Admission: EM | Admit: 2023-05-06 | Discharge: 2023-05-06 | Disposition: A | Discharge status: Home / Self Care | Payer: Medicare HMO | Attending: Emergency Medicine | Admitting: Emergency Medicine

## 2023-05-06 ENCOUNTER — Emergency Department (HOSPITAL_COMMUNITY): Payer: Medicare HMO

## 2023-05-06 ENCOUNTER — Encounter (HOSPITAL_COMMUNITY): Payer: Self-pay

## 2023-05-06 ENCOUNTER — Other Ambulatory Visit: Payer: Self-pay

## 2023-05-06 DIAGNOSIS — R110 Nausea: Secondary | ICD-10-CM | POA: Diagnosis not present

## 2023-05-06 DIAGNOSIS — Z7982 Long term (current) use of aspirin: Secondary | ICD-10-CM | POA: Diagnosis not present

## 2023-05-06 DIAGNOSIS — R519 Headache, unspecified: Secondary | ICD-10-CM | POA: Insufficient documentation

## 2023-05-06 DIAGNOSIS — M542 Cervicalgia: Secondary | ICD-10-CM | POA: Insufficient documentation

## 2023-05-06 DIAGNOSIS — G8929 Other chronic pain: Secondary | ICD-10-CM | POA: Diagnosis not present

## 2023-05-06 LAB — CBC WITH DIFFERENTIAL/PLATELET
Abs Immature Granulocytes: 0.01 10*3/uL (ref 0.00–0.07)
Basophils Absolute: 0 10*3/uL (ref 0.0–0.1)
Basophils Relative: 0 %
Eosinophils Absolute: 0 10*3/uL (ref 0.0–0.5)
Eosinophils Relative: 0 %
HCT: 31.2 % — ABNORMAL LOW (ref 39.0–52.0)
Hemoglobin: 10.9 g/dL — ABNORMAL LOW (ref 13.0–17.0)
Immature Granulocytes: 0 %
Lymphocytes Relative: 13 %
Lymphs Abs: 0.4 10*3/uL — ABNORMAL LOW (ref 0.7–4.0)
MCH: 31.3 pg (ref 26.0–34.0)
MCHC: 34.9 g/dL (ref 30.0–36.0)
MCV: 89.7 fL (ref 80.0–100.0)
Monocytes Absolute: 0.4 10*3/uL (ref 0.1–1.0)
Monocytes Relative: 10 %
Neutro Abs: 2.6 10*3/uL (ref 1.7–7.7)
Neutrophils Relative %: 77 %
Platelets: 165 10*3/uL (ref 150–400)
RBC: 3.48 MIL/uL — ABNORMAL LOW (ref 4.22–5.81)
RDW: 13.5 % (ref 11.5–15.5)
WBC: 3.4 10*3/uL — ABNORMAL LOW (ref 4.0–10.5)
nRBC: 0 % (ref 0.0–0.2)

## 2023-05-06 LAB — COMPREHENSIVE METABOLIC PANEL
ALT: 26 U/L (ref 0–44)
AST: 33 U/L (ref 15–41)
Albumin: 3.3 g/dL — ABNORMAL LOW (ref 3.5–5.0)
Alkaline Phosphatase: 37 U/L — ABNORMAL LOW (ref 38–126)
Anion gap: 6 (ref 5–15)
BUN: 24 mg/dL — ABNORMAL HIGH (ref 8–23)
CO2: 23 mmol/L (ref 22–32)
Calcium: 8 mg/dL — ABNORMAL LOW (ref 8.9–10.3)
Chloride: 101 mmol/L (ref 98–111)
Creatinine, Ser: 1.26 mg/dL — ABNORMAL HIGH (ref 0.61–1.24)
GFR, Estimated: >60 mL/min (ref >60)
Glucose, Bld: 123 mg/dL — ABNORMAL HIGH (ref 70–99)
Potassium: 3.6 mmol/L (ref 3.5–5.1)
Sodium: 130 mmol/L — ABNORMAL LOW (ref 135–145)
Total Bilirubin: 0.7 mg/dL (ref <1.2)
Total Protein: 6 g/dL — ABNORMAL LOW (ref 6.5–8.1)

## 2023-05-06 LAB — RAPID URINE DRUG SCREEN, HOSP PERFORMED
Amphetamines: NOT DETECTED
Barbiturates: NOT DETECTED
Benzodiazepines: NOT DETECTED
Cocaine: NOT DETECTED
Opiates: NOT DETECTED
Tetrahydrocannabinol: NOT DETECTED

## 2023-05-06 LAB — PROTIME-INR
INR: 1.1 (ref 0.8–1.2)
Prothrombin Time: 14.7 s (ref 11.4–15.2)

## 2023-05-06 MED ORDER — SODIUM CHLORIDE 0.9 % IV BOLUS
500.0000 mL | Freq: Once | INTRAVENOUS | Status: AC
  Administered 2023-05-06: 500 mL via INTRAVENOUS

## 2023-05-06 MED ORDER — OXYCODONE HCL 5 MG PO TABS
10.0000 mg | ORAL_TABLET | Freq: Once | ORAL | Status: AC
  Administered 2023-05-06: 10 mg via ORAL
  Filled 2023-05-06: qty 2

## 2023-05-06 MED ORDER — OXYCODONE HCL 10 MG PO TABS: 5.0000 mg | ORAL_TABLET | Freq: Four times a day (QID) | ORAL | 0 refills | Status: DC | PRN

## 2023-05-06 MED ORDER — IOHEXOL 350 MG/ML SOLN
75.0000 mL | Freq: Once | INTRAVENOUS | Status: AC | PRN
  Administered 2023-05-06: 75 mL via INTRAVENOUS

## 2023-05-06 NOTE — ED Provider Notes (Signed)
[place] EMERGENCY DEPARTMENT AT [facility] Provider Note   CSN: [id number] Arrival date & time: 05/06/23  1722     History  Chief Complaint  Patient presents with   Torticollis   Headache    Travis Nuse. is a 73 y.o. male.  72 year old male with prior medical history as detailed below presents for evaluation.  He complains approximately 10 days of persistent posterior neck tightness and pain.  Pain is worse with movement of his neck.  He reports associated mild nausea, mild headache, " spots" in his vision.  He reports that family members have told him that his speech is slurred.  His speech is not currently slurred.  He denies other symptoms.  He has not been taking anything for the pain.  He has chronic pain issues.  He typically takes oxycodone for these.  He has been out of his oxycodone since December 2.  He reports that he is "in between" pain management providers.    The history is provided by the patient and medical records.       Home Medications Prior to Admission medications   Medication Sig Start Date End Date Taking? Authorizing Provider  silver sulfADIAZINE (SILVADENE) 1 % cream Apply 1 Application topically daily. 01/26/23   Nadara Mustard, MD  ARIPiprazole (ABILIFY) 10 MG tablet Take 1 tablet (10 mg total) by mouth daily. 06/28/22 06/28/23  Cleotis Nipper, MD  aspirin EC 81 MG tablet Take 81 mg by mouth at bedtime. Swallow whole.    [provider]  atorvastatin (LIPITOR) 20 MG tablet TAKE ONE TABLET BY MOUTH EVERY DAY 03/08/23   Kathleene Hazel, MD  budesonide-formoterol [facility]) 160-4.5 MCG/ACT inhaler Inhale 2 puffs into the lungs 2 (two) times daily. 11/27/22   Corwin Levins, MD  Buprenorphine HCl 750 MCG FILM Place 750 mcg inside cheek in the morning and at bedtime. 06/21/22   Marzetta Board, PA-C  calcium carbonate (TUMS - DOSED IN MG ELEMENTAL CALCIUM) 500 MG chewable tablet Chew 1 tablet by mouth 3 (three) times daily  with meals.    [provider]  celecoxib (CELEBREX) 200 MG capsule Take 1 capsule (200 mg total) by mouth 2 (two) times daily. 07/04/21   Kerrin Champagne, MD  Cholecalciferol (VITAMIN D3 PO) Take 2 tablets by mouth at bedtime.    [provider]  donepezil (ARICEPT) 10 MG tablet Take 1 tablet (10 mg total) by mouth at bedtime. 08/07/22   Corwin Levins, MD  DULoxetine (CYMBALTA) 30 MG capsule Take 1 capsule (30 mg total) by mouth 2 (two) times daily. 06/28/22   Arfeen, Phillips Grout, MD  gabapentin (NEURONTIN) 800 MG tablet Take 800 mg by mouth 3 (three) times daily. 12/20/20   Kerrin Champagne, MD  losartan (COZAAR) 50 MG tablet TAKE ONE TABLET BY MOUTH EVERY DAY 04/24/22   Corwin Levins, MD  methocarbamol (ROBAXIN) 750 MG tablet Take 750 mg by mouth 3 (three) times daily. Muscle spasms    [provider]  MYRBETRIQ 50 MG TB24 tablet Take 50 mg by mouth at bedtime. 02/21/21   [provider]  Omega-3 Fatty Acids (FISH OIL) 1000 MG CAPS Take 2,000 mg by mouth daily.    [provider]  oxyCODONE-acetaminophen (PERCOCET) 10-325 MG tablet Take 1 tablet by mouth 4 (four) times daily.    [provider]  Pancrelipase, Lip-Prot-Amyl, (ZENPEP) 40000-126000 units CPEP Take 3 capsules with meals and 2 capsule with each  snack (2-3 snacks/day) 05/31/22   Armbruster, Willaim Rayas, MD  prasugrel (EFFIENT) 5 MG TABS tablet Take 1 tablet (5 mg total) by mouth daily. 01/30/23   Kathleene Hazel, MD  sildenafil (VIAGRA) 50 MG tablet Take 1 tablet (50 mg total) by mouth daily as needed for erectile dysfunction. 01/16/23   Sharlene Dory, PA-C  tamsulosin (FLOMAX) 0.4 MG CAPS capsule Take 0.4 mg by mouth 2 (two) times daily. 06/30/20   [provider]  Testosterone 1.62 % GEL place two pumps onto THE SKIN EVERY DAY 05/08/22   Corwin Levins, MD  traZODone (DESYREL) 150 MG tablet Take 1 tablet (150 mg total) by mouth at bedtime. 06/28/22   Arfeen, Phillips Grout, MD  zolpidem (AMBIEN  CR) 12.5 MG CR tablet Take 12.5 mg by mouth at bedtime.    [provider]      Allergies    Buprenorphine    Review of Systems   Review of Systems  All other systems reviewed and are negative.   Physical Exam Updated Vital Signs BP (!) 89/66   Pulse 89   Temp 98 F (36.7 C)   Resp 20   Ht 6\' 1"  (1.854 m)   Wt 130 kg   SpO2 97%   BMI 37.81 kg/m  Physical Exam Vitals and nursing note reviewed.  Constitutional:      General: He is not in acute distress.    Appearance: Normal appearance. He is well-developed.  HENT:     Head: Normocephalic and atraumatic.  Eyes:     Conjunctiva/sclera: Conjunctivae normal.     Pupils: Pupils are equal, round, and reactive to light.  Cardiovascular:     Rate and Rhythm: Normal rate and regular rhythm.     Heart sounds: Normal heart sounds.  Pulmonary:     Effort: Pulmonary effort is normal. No respiratory distress.     Breath sounds: Normal breath sounds.  Abdominal:     General: There is no distension.     Palpations: Abdomen is soft.     Tenderness: There is no abdominal tenderness.  Musculoskeletal:        General: No deformity. Normal range of motion.     Cervical back: Normal range of motion and neck supple.  Skin:    General: Skin is warm and dry.  Neurological:     General: No focal deficit present.     Mental Status: He is alert and oriented to person, place, and time.     GCS: GCS eye subscore is 4. GCS verbal subscore is 5. GCS motor subscore is 6.     Cranial Nerves: No cranial nerve deficit, dysarthria or facial asymmetry.     Sensory: No sensory deficit.     Motor: No weakness.     Coordination: Romberg sign negative.     ED Results / Procedures / Treatments   Labs (all labs ordered are listed, but only abnormal results are displayed) Labs Reviewed  CBC WITH DIFFERENTIAL/PLATELET  COMPREHENSIVE METABOLIC PANEL  RAPID URINE DRUG SCREEN, HOSP PERFORMED  PROTIME-INR    EKG None  Radiology No  results found.  Procedures Procedures    Medications Ordered in ED Medications  sodium chloride 0.9 % bolus 500 mL (has no administration in time range)  oxyCODONE (Oxy IR/ROXICODONE) immediate release tablet 10 mg (10 mg Oral Given 05/06/23 1812)    ED Course/ Medical Decision Making/ A&P  Medical Decision Making Amount and/or Complexity of Data Reviewed Labs: ordered. Radiology: ordered.  Risk Prescription drug management.    Medical Screen Complete  This patient presented to the ED with complaint of neck pain.  This complaint involves an extensive number of treatment options. The initial differential diagnosis includes, but is not limited to, muscular neck pain, arterial dissection, metabolic abnormality, etc  This presentation is: Acute, Chronic, Self-Limited, Previously Undiagnosed, Uncertain Prognosis, Complicated, Systemic Symptoms, and Threat to Life/Bodily Function  Patient with neck pain, hx of chronic pain.   Patient with many atypical symptoms. Overall, impression is of likely muscular cervical spine pain.  CT imaging without acute abnormality/findings.   Labs without acute abnormality.   Patient is much improved with treatment (oxycodone). He has been out of meds since Dec 2.   Importance of close FU stressed. Strict return precautions given and understood.    Additional history obtained: External records from outside sources obtained and reviewed including prior ED visits and prior Inpatient records.    Lab Tests:  I ordered and personally interpreted labs.  The pertinent results include:  cbc cmp inr   Imaging Studies ordered:  I ordered imaging studies including cta head and neck  I independently visualized and interpreted obtained imaging which showed nad I agree with the radiologist interpretation.   Cardiac Monitoring:  The patient was maintained on a cardiac monitor.  I personally viewed and  interpreted the cardiac monitor which showed an underlying rhythm of: nad   Medicines ordered:  I ordered medication including oxycodone  for pain  Reevaluation of the patient after these medicines showed that the patient: improved  Problem List / ED Course:  Neck pain   Reevaluation:  After the interventions noted above, I reevaluated the patient and found that they have: improved  Disposition:  After consideration of the diagnostic results and the patients response to treatment, I feel that the patent would benefit from close out patient followup.          Final Clinical Impression(s) / ED Diagnoses Final diagnoses:  Neck pain    Rx / DC Orders ED Discharge Orders     None         Wynetta Fines, MD 05/06/23 2059

## 2023-05-06 NOTE — ED Notes (Signed)
Patient transported to CT 

## 2023-05-06 NOTE — ED Triage Notes (Signed)
C/o neck tightness, double vision, nausea, headache, and slurred speech x10 days.  Patient A&O x4 in triage.

## 2023-05-06 NOTE — Discharge Instructions (Signed)
Return for any problem.  ?

## 2023-05-30 ENCOUNTER — Ambulatory Visit: Payer: Medicare PPO | Admitting: Orthopedic Surgery

## 2023-06-01 NOTE — Progress Notes (Signed)
 [facility]  Pain Center  PATIENT NAME: Travis [name] PATIENT MRN: [id number] DATE OF SURGERY: 06/01/2023  TITLE OF PROCEDURE:  1) Sacroiliac joint Injection      2) Fluoroscopic Guidance    SIDE: Bilateral  PREOPERATIVE DIAGNOSES: Sacroiliac Joint Dysfunction  POSTOPERATIVE DIAGNOSES:  Same  LOCATION: [facility]  SURGEON: Janus Patel MD  ASSISTANTS: None  ANESTHESIA:  Local  DESCRIPTION OF OPERATIVE PROCEDURE:  Informed consent was obtained. An IV was not placed. The patient was brought to the Procedure Room and she positioned herself to her comfort and optimal positioning for procedure.  Time-out was conducted with all members of the care team.  Standard ASA monitors were placed. Standard prep and drape were performed.  Fluoroscopic views were obtained of the sacroiliac joint. The area overlying the joint was identified with contralateral oblique fluoroscopy and marked with a sterile marking pen. A 22-gauge 3.5 inch spinal needle was guided into the sacroiliac joint under fluoroscopy. After negative aspiration for heme, CSF, or air, 1 cc of air was injected showing appropriate air arthrogram under live fluoroscopy. Images were saved in two different fluoroscopic views.  After a negative aspiration, a swirled 2 mL mixture consisting of 1 mL 0.25% bupivacaine  and 40 mg methylprednisolone  was incrementally injected into the joint space and needle was removed. The procedure was then repeated on the opposite side.   The surgical site preparation was washed off of the patient. Band-Aids were applied. The patient was brought to the recovery area.  The patient did very well and the procedure results were discussed.  Standard discharge instructions were given to the patient.  The patient knows how to contact the clinic should they have any questions or problems.  Our clinic nurse will call the patient for follow-up tomorrow.  Preprocedural pain score was   9/10 Immediate Post procedural pain score was 1/10  COMPLICATIONS: none  EBL: minimal  URINE OUTPUT: not monitored  FLUIDS: none  PLAN: Refills for Perc 10/325 QID x 2 months sent per plan from recent OV. UDS clear.  Pain Sacroiliac Joint Injection  Performed by: Darwyn Blanch, MD Authorized by: Darwyn Blanch, MD   Procedure:  Pain Sacroiliac Joint Injection Procedure Laterality:  Bilateral

## 2023-06-05 ENCOUNTER — Encounter: Payer: Self-pay | Admitting: Podiatry

## 2023-06-05 ENCOUNTER — Ambulatory Visit (INDEPENDENT_AMBULATORY_CARE_PROVIDER_SITE_OTHER): Payer: Medicare PPO | Admitting: Podiatry

## 2023-06-05 DIAGNOSIS — L03032 Cellulitis of left toe: Secondary | ICD-10-CM | POA: Diagnosis not present

## 2023-06-05 DIAGNOSIS — M205X2 Other deformities of toe(s) (acquired), left foot: Secondary | ICD-10-CM | POA: Diagnosis not present

## 2023-06-05 DIAGNOSIS — E11621 Type 2 diabetes mellitus with foot ulcer: Secondary | ICD-10-CM

## 2023-06-05 DIAGNOSIS — L97521 Non-pressure chronic ulcer of other part of left foot limited to breakdown of skin: Secondary | ICD-10-CM

## 2023-06-05 MED ORDER — CEPHALEXIN 500 MG PO CAPS: 500.0000 mg | ORAL_CAPSULE | Freq: Three times a day (TID) | ORAL | 0 refills | Status: AC
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
## 2023-06-07 MED ORDER — MUPIROCIN 2 % EX OINT: TOPICAL_OINTMENT | CUTANEOUS | 1 refills | Status: DC

## 2023-06-12 ENCOUNTER — Ambulatory Visit (HOSPITAL_COMMUNITY): Admission: RE | Admit: 2023-06-12 | Payer: Self-pay | Source: Home / Self Care | Admitting: Orthopedic Surgery

## 2023-06-12 DIAGNOSIS — Z01818 Encounter for other preprocedural examination: Secondary | ICD-10-CM

## 2023-06-19 ENCOUNTER — Other Ambulatory Visit: Payer: Self-pay | Admitting: Gastroenterology

## 2023-06-25 ENCOUNTER — Telehealth: Payer: Self-pay | Admitting: Podiatry

## 2023-06-25 NOTE — Telephone Encounter (Signed)
Patient is requesting provider to call in prescription for corn removal for foot previously scrapped by Dr. Carlota Raspberry.

## 2023-06-27 ENCOUNTER — Ambulatory Visit: Payer: Medicare PPO | Admitting: Orthopedic Surgery

## 2023-06-28 ENCOUNTER — Other Ambulatory Visit: Payer: Self-pay | Admitting: Internal Medicine

## 2023-06-29 ENCOUNTER — Other Ambulatory Visit: Payer: Self-pay | Admitting: Internal Medicine

## 2023-07-02 ENCOUNTER — Other Ambulatory Visit: Payer: Self-pay | Admitting: Podiatry

## 2023-07-02 ENCOUNTER — Encounter: Payer: Medicare HMO | Admitting: Podiatry

## 2023-07-02 NOTE — Progress Notes (Signed)
 Patient did not show for his scheduled appointment this morning

## 2023-07-04 ENCOUNTER — Other Ambulatory Visit: Payer: Self-pay | Admitting: Internal Medicine

## 2023-07-09 ENCOUNTER — Telehealth: Payer: Self-pay | Admitting: Orthopedic Surgery

## 2023-07-09 NOTE — Telephone Encounter (Signed)
Called patient to let him know [employer] denied his right reverse shoulder arthroplasty with Dr. August Saucer because he has not had at least 6 weeks of physical therapy in the last 12 months.  Patient said to sign him up for physical therapy.  '

## 2023-07-10 NOTE — Telephone Encounter (Signed)
Physical therapy once every 2 weeks for 6 weeks with home exercise program for range of motion and strengthening to pain tolerance.  Thanks.  Fairly ridiculous.

## 2023-07-10 NOTE — Telephone Encounter (Signed)
I called patient who is requesting HHPT as he does not have car, etc. He said if we are unable to get this approved, he will have to go to the outpatient center near [place].

## 2023-07-11 ENCOUNTER — Inpatient Hospital Stay (HOSPITAL_COMMUNITY): Admission: RE | Admit: 2023-07-11 | Payer: Medicare PPO | Source: Ambulatory Visit

## 2023-07-11 ENCOUNTER — Other Ambulatory Visit: Payer: Self-pay

## 2023-07-11 DIAGNOSIS — M25511 Pain in right shoulder: Secondary | ICD-10-CM

## 2023-07-11 NOTE — Telephone Encounter (Signed)
I have put in order for HHPT will submit.

## 2023-07-11 NOTE — Telephone Encounter (Signed)
Emailed Psychologist, prison and probation services with [employer].

## 2023-07-12 NOTE — Telephone Encounter (Signed)
Patients insurance requires recent F2F.  Will see Travis [name] tomorrow afternoon so we can get HHPT ordered and started.

## 2023-07-13 ENCOUNTER — Ambulatory Visit (INDEPENDENT_AMBULATORY_CARE_PROVIDER_SITE_OTHER): Payer: Medicare HMO | Admitting: Surgical

## 2023-07-13 ENCOUNTER — Encounter: Payer: Self-pay | Admitting: Surgical

## 2023-07-13 ENCOUNTER — Other Ambulatory Visit (INDEPENDENT_AMBULATORY_CARE_PROVIDER_SITE_OTHER): Payer: Medicare HMO

## 2023-07-13 DIAGNOSIS — Z981 Arthrodesis status: Secondary | ICD-10-CM

## 2023-07-13 DIAGNOSIS — M19011 Primary osteoarthritis, right shoulder: Secondary | ICD-10-CM
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
## 2023-07-17 ENCOUNTER — Encounter: Payer: Self-pay | Admitting: Surgical

## 2023-07-17 ENCOUNTER — Ambulatory Visit (HOSPITAL_COMMUNITY): Admit: 2023-07-17 | Payer: Medicare PPO | Admitting: Orthopedic Surgery

## 2023-07-17 SURGERY — ARTHROPLASTY, SHOULDER, TOTAL, REVERSE
Anesthesia: General | Site: Shoulder | Laterality: Right

## 2023-07-18 ENCOUNTER — Other Ambulatory Visit: Payer: Self-pay | Admitting: Internal Medicine

## 2023-07-18 NOTE — Telephone Encounter (Signed)
 Wrong office

## 2023-07-18 NOTE — Telephone Encounter (Signed)
 Copied from CRM [id number]. Topic: Clinical - Medication Refill >> Jul 18, 2023 12:09 PM Fredrich Romans wrote: Most Recent Primary Care Visit:  Provider: Cristy Folks  Department: [facility] WEIGHT MGT  Visit Type: MYCHART VIDEO VISIT  Date: 01/16/2022  Medication: losartan (COZAAR) 50 MG tablet  Has the patient contacted their pharmacy? Yes (Agent: If no, request that the patient contact the pharmacy for the refill. If patient does not wish to contact the pharmacy document the reason why and proceed with request.) (Agent: If yes, when and what did the pharmacy advise?)  Is this the correct pharmacy for this prescription? Yes If no, delete pharmacy and type the correct one.  This is the patient's preferred pharmacy:  [facility] - [place], [place] - [address] [place] [address] Phone: [phone] Fax: [phone]    Has the prescription been filled recently? No  Is the patient out of the medication? Yes  Has the patient been seen for an appointment in the last year OR does the patient have an upcoming appointment? No  Can we respond through MyChart? Yes  Agent: Please be advised that Rx refills may take up to 3 business days. We ask that you follow-up with your pharmacy.

## 2023-07-19 ENCOUNTER — Telehealth: Payer: Self-pay | Admitting: Podiatry

## 2023-07-19 NOTE — Telephone Encounter (Signed)
 Pt left message today at 1041pm stating to cxl appt on Tuesday 3/4 as his foot is feeling better and he does not have copay.  Lvm for pt that I received his message and appt is cxled and to call to r/s Also let pt know he may get a call this afternoon stating he still had the appt but it was cxled and ignore the message as the system does not catch up to [place] when we cxl.

## 2023-07-24 ENCOUNTER — Ambulatory Visit: Payer: Medicare HMO | Admitting: Podiatry

## 2023-07-24 ENCOUNTER — Ambulatory Visit: Payer: Medicare PPO | Admitting: Podiatry

## 2023-07-28 ENCOUNTER — Ambulatory Visit
Admission: RE | Admit: 2023-07-28 | Discharge: 2023-07-28 | Disposition: A | Discharge status: Home / Self Care | Payer: Medicare HMO | Source: Ambulatory Visit | Attending: Surgical | Admitting: Surgical

## 2023-07-28 DIAGNOSIS — Z981 Arthrodesis status: Secondary | ICD-10-CM

## 2023-08-13 ENCOUNTER — Other Ambulatory Visit: Payer: Self-pay | Admitting: Internal Medicine

## 2023-08-15 ENCOUNTER — Encounter (HOSPITAL_COMMUNITY): Payer: Self-pay | Admitting: Psychiatry

## 2023-08-15 ENCOUNTER — Telehealth (HOSPITAL_COMMUNITY): Payer: Self-pay | Admitting: Psychiatry

## 2023-08-15 ENCOUNTER — Ambulatory Visit (HOSPITAL_BASED_OUTPATIENT_CLINIC_OR_DEPARTMENT_OTHER): Admitting: Psychiatry

## 2023-08-15 VITALS — Wt 220.0 lb

## 2023-08-15 DIAGNOSIS — F5104 Psychophysiologic insomnia: Secondary | ICD-10-CM

## 2023-08-15 DIAGNOSIS — F419 Anxiety disorder, unspecified: Secondary | ICD-10-CM

## 2023-08-15 DIAGNOSIS — F319 Bipolar disorder, unspecified: Secondary | ICD-10-CM

## 2023-08-15 NOTE — Telephone Encounter (Signed)
 Per provider, patient does not need to schedule follow up appointment, He will see [facility] for in-house visits.
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
## 2023-08-18 ENCOUNTER — Other Ambulatory Visit: Payer: Self-pay | Admitting: Gastroenterology

## 2023-08-23 ENCOUNTER — Ambulatory Visit: Admitting: Orthopedic Surgery

## 2023-08-23 ENCOUNTER — Other Ambulatory Visit (INDEPENDENT_AMBULATORY_CARE_PROVIDER_SITE_OTHER): Payer: Self-pay

## 2023-08-23 DIAGNOSIS — M19011 Primary osteoarthritis, right shoulder: Secondary | ICD-10-CM

## 2023-08-23 DIAGNOSIS — M5412 Radiculopathy, cervical region: Secondary | ICD-10-CM

## 2023-08-23 NOTE — Progress Notes (Signed)
 Orthopedic Spine Surgery Office Note  Assessment: Patient is a 73 y.o. male with neck pain that radiates into the left trapezius and shoulder.  Based on the pattern, suspect it is from the C4/5 foraminal stenosis   Plan: -Explained that initially conservative treatment is tried as a significant number of patients may experience relief with these treatment modalities. Discussed that the conservative treatments include:  -activity modification  -physical therapy  -over the counter pain medications  -medrol dosepak  -cervical steroid injections -Patient has tried oxycodone, prednisone, tylenol, gabapentin -Recommended cervical ESI and PT. Referral provided to him specifically for his neck -Patient should return to office in 6-8 weeks, x-rays at next visit: none   Patient expressed understanding of the plan and all questions were answered to the patient's satisfaction.   ___________________________________________________________________________   History:  Patient is a 73 y.o. male who presents today for cervical spine.  Patient is now had about 2 months of neck pain that radiates into the left upper extremity.  He feels a going along the trapezius into the lateral shoulder.  It does not radiate past the shoulder.  He does not have any pain radiating to the right upper extremity.  He does have right shoulder pain and is supposed to be undergoing right shoulder arthroplasty with Dr. August Saucer soon.  He feels the left trapezius and shoulder pain with activity and at rest.  There is no trauma or injury that preceded the onset of his pain.  He also notes numbness and tingling in the same distribution as the pain in his left trapezius and shoulder.  He does not have any other numbness or paresthesias.  He has not noticed any weakness in his upper extremities.  He has had issues with balance as a result of an ulcer on his right BKA stump.  Otherwise, has not had any issues with unsteadiness or  imbalance.   Treatments tried: oxycodone, prednisone, tylenol, gabapentin  Review of systems: Denies fevers and chills, night sweats, unexplained weight loss, history of cancer, pain that wakes them at night  Past medical history: Fibromyalgia HTN Depression Anxiety CAD MI Chronic pain Sleep apnea DM Urethral stricture Allergic rhinitis Angina Osteoarthritis GERD Vitamin D deficiency   Allergies: Buprenorphine   Past surgical history (39): R BKA Bilateral THA Bilateral TKA L1-S1 PSIF Polypectomy R ankle fusion C3/4 ACDF Carpal tunnel release TN fusion Knee arthroscopy x3 ORIF ankle fracture Rotator cuff repair x4 Revision THA Vasectomy Urethral dilation   Social history: Denies use of nicotine product (smoking, vaping, patches, smokeless) Alcohol use: denies Denies recreational drug use   Physical Exam:  General: no acute distress, appears stated age Neurologic: alert, answering questions appropriately, following commands Respiratory: unlabored breathing on room air, symmetric chest rise Psychiatric: appropriate affect, normal cadence to speech   MSK (spine):  -Strength exam      Left  Right Grip strength                5/5  5/5 Interosseus   5/5   5/5 Wrist extension  5/5  5/5 Wrist flexion   5/5  5/5 Elbow flexion   5/5  5/5 Deltoid    5/5  3/5  EHL    5/5  -/5 TA    5/5  -/5 GSC    5/5  -/5 Knee extension  5/5  5/5 Hip flexion   5/5  5/5  Unable to test EHL, TA, GSC on the right due to BKA Deltoid strength testing limited by  shoulder pain on the right  -Sensory exam    Sensation intact to light touch in L2-S1 nerve distributions of the left lower extremity.  Sensation intact light touch in L2 and L3 distributions on the right  Sensation intact to light touch in C4-T1 nerve distributions of bilateral upper extremities  -Brachioradialis DTR: 2/4 on the left, 2/4 on the right -Biceps DTR: 2/4 on the left, 2/4 on the right -Achilles  DTR: 2/4 on the left, -/4 on the right -Patellar tendon DTR: 2/4 on the left, -/4 on the right  -Spurling: Negative bilaterally -Hoffman sign: Negative bilaterally -Clonus: No beats on the left -Interosseous wasting: None seen -Grip and release test: Negative  Left shoulder exam: No pain through range of motion, negative Jobe, negative drop arm sign, negative belly press, no weakness with external rotation with arm at side Right shoulder exam: Pain with internal and external rotation  Imaging: XRs of the cervical spine from 08/23/2023 were independently reviewed and interpreted, showing anterior cervical plate at B2/8 with no lucency around the screws.  There appears to be fusion mass across the former disc base.  Disc height loss at C4/5, C5/6, C6/7.  No evidence of instability on flexion/extension views.  No fracture or dislocation seen.  XRs of the thoracic spine from 08/23/2023 were independently reviewed and interpreted, showing posterior lumbar instrumentation extending up to the L1 level.  Entire construct not visualized.  No significant degenerative changes seen.  No fracture or dislocation seen.  MRI of the cervical spine from 07/28/2023 was independently reviewed and interpreted, showing bilateral foraminal stenosis at C4/5, C7/T1. Central stenosis at T2/3. No other significant stenosis seen. No T2 cord signal change.    Patient name: Travis [name] Patient MRN: [id number] Date of visit: 08/23/23

## 2023-08-27 ENCOUNTER — Telehealth: Payer: Self-pay | Admitting: Orthopedic Surgery

## 2023-08-27 ENCOUNTER — Other Ambulatory Visit: Payer: Self-pay | Admitting: Radiology

## 2023-08-27 DIAGNOSIS — M5412 Radiculopathy, cervical region: Secondary | ICD-10-CM

## 2023-08-27 NOTE — Telephone Encounter (Signed)
 Order placed by Dr. Christell Constant

## 2023-08-27 NOTE — Telephone Encounter (Signed)
 Patient says [facility] is doing PT at home for his leg and balance. Would like a referral for home PT for his back and shoulder.

## 2023-09-10 ENCOUNTER — Other Ambulatory Visit: Payer: Self-pay

## 2023-09-10 ENCOUNTER — Ambulatory Visit (INDEPENDENT_AMBULATORY_CARE_PROVIDER_SITE_OTHER): Admitting: Physical Medicine and Rehabilitation

## 2023-09-10 VITALS — BP 112/67 | HR 55

## 2023-09-10 DIAGNOSIS — M5412 Radiculopathy, cervical region: Secondary | ICD-10-CM

## 2023-09-10 MED ORDER — METHYLPREDNISOLONE ACETATE 40 MG/ML IJ SUSP: 40.0000 mg | Freq: Once | INTRAMUSCULAR | Status: DC

## 2023-09-10 NOTE — Progress Notes (Unsigned)
 Pain Scale   Average Pain 8 Patient states his neck pain is constant and he never get relief.        +Driver, -BT, -Dye Allergies.

## 2023-09-10 NOTE — Patient Instructions (Signed)

## 2023-09-11 ENCOUNTER — Ambulatory Visit: Admitting: Physical Therapy

## 2023-09-12 ENCOUNTER — Telehealth: Payer: Self-pay | Admitting: Orthopedic Surgery

## 2023-09-12 NOTE — Telephone Encounter (Signed)
 Gracie, PT at [facility] called asking for VO on HHOT evaluation on shoulder  Call back: [phone]

## 2023-09-12 NOTE — Procedures (Signed)
 Cervical Epidural Steroid Injection - Interlaminar Approach with Fluoroscopic Guidance  Patient: Travis HIBLER Sr.      Date of Birth: April 22, 1951 MRN: [id number] PCP: Sharma Dears, NP      Visit Date: 09/10/2023   Universal Protocol:    Date/Time: 04/23/258:07 AM  Consent Given By: the patient  Position: PRONE  Additional Comments: Vital signs were monitored before and after the procedure. Patient was prepped and draped in the usual sterile fashion. The correct patient, procedure, and site was verified.   Injection Procedure Details:   Procedure diagnoses: Radiculopathy, cervical region [M54.12]    Meds Administered:  Meds ordered this encounter  Medications   methylPREDNISolone  acetate (DEPO-MEDROL ) injection 40 mg     Laterality: Left  Location/Site: C7-T1  Needle: 3.5 in., 20 ga. Tuohy  Needle Placement: Paramedian epidural space  Findings:  -Comments: Excellent flow of contrast into the epidural space.  Procedure Details: Using a paramedian approach from the side mentioned above, the region overlying the inferior lamina was localized under fluoroscopic visualization and the soft tissues overlying this structure were infiltrated with 4 ml. of 1% Lidocaine  without Epinephrine . A # 20 gauge, Tuohy needle was inserted into the epidural space using a paramedian approach.  The epidural space was localized using loss of resistance along with contralateral oblique bi-planar fluoroscopic views.  After negative aspirate for air, blood, and CSF, a 2 ml. volume of Isovue -250 was injected into the epidural space and the flow of contrast was observed. Radiographs were obtained for documentation purposes.   The injectate was administered into the level noted above.  Additional Comments:  The patient tolerated the procedure well Dressing: 2 x 2 sterile gauze and Band-Aid    Post-procedure details: Patient was observed during the procedure. Post-procedure  instructions were reviewed.  Patient left the clinic in stable condition.

## 2023-09-12 NOTE — Telephone Encounter (Signed)
Called verbal

## 2023-09-12 NOTE — Progress Notes (Signed)
 Ramez Arrona. - 73 y.o. male MRN [id number]  Date of birth: [birth year]  Office Visit Note: Visit Date: 09/10/2023 PCP: Sharma Dears, NP Referred by: Diedra Fowler, MD  Subjective: Chief Complaint  Patient presents with   Neck - Pain   HPI:  Conny Moening. is a 73 y.o. male who comes in today at the request of Dr. Colette Davies for planned Left C7-T1 Cervical Interlaminar epidural steroid injection with fluoroscopic guidance.  The patient has failed conservative care including home exercise, medications, time and activity modification.  This injection will be diagnostic and hopefully therapeutic.  Please see requesting physician notes for further details and justification.   ROS Otherwise per HPI.  Assessment & Plan: Visit Diagnoses:    ICD-10-CM   1. Radiculopathy, cervical region  M54.12 XR C-ARM NO REPORT    Epidural Steroid injection    methylPREDNISolone  acetate (DEPO-MEDROL ) injection 40 mg      Plan: No additional findings.   Meds & Orders:  Meds ordered this encounter  Medications   methylPREDNISolone  acetate (DEPO-MEDROL ) injection 40 mg    Orders Placed This Encounter  Procedures   XR C-ARM NO REPORT   Epidural Steroid injection    Follow-up: Return for visit to requesting provider as needed.   Procedures: No procedures performed  Cervical Epidural Steroid Injection - Interlaminar Approach with Fluoroscopic Guidance  Patient: CORNEY KNIGHTON Sr.      Date of Birth: [birth year] MRN: [id number] PCP: Sharma Dears, NP      Visit Date: 09/10/2023   Universal Protocol:    Date/Time: 04/23/258:07 AM  Consent Given By: the patient  Position: PRONE  Additional Comments: Vital signs were monitored before and after the procedure. Patient was prepped and draped in the usual sterile fashion. The correct patient, procedure, and site was verified.   Injection Procedure Details:   Procedure diagnoses: Radiculopathy,  cervical region [M54.12]    Meds Administered:  Meds ordered this encounter  Medications   methylPREDNISolone  acetate (DEPO-MEDROL ) injection 40 mg     Laterality: Left  Location/Site: C7-T1  Needle: 3.5 in., 20 ga. Tuohy  Needle Placement: Paramedian epidural space  Findings:  -Comments: Excellent flow of contrast into the epidural space.  Procedure Details: Using a paramedian approach from the side mentioned above, the region overlying the inferior lamina was localized under fluoroscopic visualization and the soft tissues overlying this structure were infiltrated with 4 ml. of 1% Lidocaine  without Epinephrine . A # 20 gauge, Tuohy needle was inserted into the epidural space using a paramedian approach.  The epidural space was localized using loss of resistance along with contralateral oblique bi-planar fluoroscopic views.  After negative aspirate for air, blood, and CSF, a 2 ml. volume of Isovue -250 was injected into the epidural space and the flow of contrast was observed. Radiographs were obtained for documentation purposes.   The injectate was administered into the level noted above.  Additional Comments:  The patient tolerated the procedure well Dressing: 2 x 2 sterile gauze and Band-Aid    Post-procedure details: Patient was observed during the procedure. Post-procedure instructions were reviewed.  Patient left the clinic in stable condition.   Clinical History: MRI CERVICAL SPINE WITHOUT CONTRAST   TECHNIQUE: Multiplanar, multisequence MR imaging of the cervical spine was performed. No intravenous contrast was administered.   COMPARISON:  MRI of the cervical spine dated 09/12/2013; MRI of the thoracic spine dated 08/16/2018   FINDINGS: Examination is degraded by motion.  Alignment: Grade 1 anterolisthesis of T2 on T3.   Vertebrae: Anterior cervical fusion at C3-C4. Degenerative endplate marrow changes at a few levels. No acute fracture is identified.    Cord: Normal signal and morphology.   Posterior Fossa, vertebral arteries, paraspinal tissues: The visualized portions of the skull base and the posterior fossa are normal. No soft tissue abnormality is identified.   Disc levels:   C2-C3: Disc osteophyte complex. Moderate left and mild right facet arthropathy. Moderate left uncovertebral joint disease. Moderate left neuroforaminal stenosis. No spinal canal stenosis.   C3-C4: Anterior fusion. Moderate left and mild right facet arthropathy. Severe left and moderate right uncovertebral joint disease. Severe left and moderate right neuroforaminal stenosis. No spinal canal stenosis.   C4-C5: Disc osteophyte complex. Moderate bilateral facet arthropathy. Moderate uncovertebral joint disease. Moderate bilateral neuroforaminal stenosis. Mild spinal canal stenosis.   C5-C6: Disc osteophyte complex. Moderate left and mild right facet arthropathy. Moderate left and mild right uncovertebral joint disease. Moderate left and mild right neuroforaminal stenosis. No spinal canal stenosis.   C6-C7: Disc osteophyte complex. Moderate left and mild right facet arthropathy. Moderate left and mild right uncovertebral joint disease. Moderate left and mild right neuroforaminal stenosis. No spinal canal stenosis.   C7-T1: The disk is normal in configuration. Moderate bilateral facet arthropathy. Moderate left and mild right uncovertebral joint disease. Moderate bilateral neuroforaminal stenosis. No spinal canal stenosis.   T2-T3: Severe canal stenosis and abutment of the cord at T2-T3 secondary to disc bulging and facet arthropathy. Severe foraminal stenosis bilaterally at this level.   IMPRESSION: Examination is degraded by motion.   1. Anterior cervical fusion at C3-C4 2. Severe canal and foraminal stenosis bilaterally at T2-T3 secondary to disc bulging and facet arthropathy. Abutment of the cord without definitive signal change. 3. Severe  left and moderate right foraminal stenoses at C3-C4 secondary to uncovertebral joint disease and facet arthropathy. Moderate foraminal stenosis at multiple other levels as detailed above. 4. No significant canal stenosis.     Electronically Signed   By: Johnanna Mylar M.D.   On: 08/17/2023 14:00     Objective:  VS:  HT:    WT:   BMI:     BP:112/67  HR:(!) 55bpm  TEMP: ( )  RESP:  Physical Exam Vitals and nursing note reviewed.  Constitutional:      General: He is not in acute distress.    Appearance: Normal appearance. He is not ill-appearing.  HENT:     Head: Normocephalic and atraumatic.     Right Ear: External ear normal.     Left Ear: External ear normal.  Eyes:     Extraocular Movements: Extraocular movements intact.  Cardiovascular:     Rate and Rhythm: Normal rate.     Pulses: Normal pulses.  Abdominal:     General: There is no distension.     Palpations: Abdomen is soft.  Musculoskeletal:        General: No signs of injury.     Cervical back: Neck supple. Tenderness present. No rigidity.     Right lower leg: No edema.     Left lower leg: No edema.     Comments: Patient has good strength in the upper extremities with 5 out of 5 strength in wrist extension long finger flexion APB.  No intrinsic hand muscle atrophy.  Negative Hoffmann's test.  Lymphadenopathy:     Cervical: No cervical adenopathy.  Skin:    Findings: No erythema or rash.  Neurological:  General: No focal deficit present.     Mental Status: He is alert and oriented to person, place, and time.     Sensory: No sensory deficit.     Motor: No weakness or abnormal muscle tone.     Coordination: Coordination normal.  Psychiatric:        Mood and Affect: Mood normal.        Behavior: Behavior normal.      Imaging: No results found.

## 2023-09-18 ENCOUNTER — Other Ambulatory Visit: Payer: Self-pay | Admitting: Gastroenterology

## 2023-09-19 ENCOUNTER — Encounter (HOSPITAL_COMMUNITY): Payer: Self-pay | Admitting: *Deleted

## 2023-09-25 ENCOUNTER — Other Ambulatory Visit: Payer: Self-pay | Admitting: Internal Medicine

## 2023-10-04 ENCOUNTER — Other Ambulatory Visit: Payer: Self-pay | Admitting: Gastroenterology

## 2023-10-30 ENCOUNTER — Ambulatory Visit: Admitting: Physician Assistant

## 2023-10-31 ENCOUNTER — Ambulatory Visit: Admitting: Physician Assistant

## 2023-11-02 ENCOUNTER — Ambulatory Visit: Admitting: Physician Assistant

## 2023-11-06 ENCOUNTER — Other Ambulatory Visit: Payer: Self-pay

## 2023-11-06 ENCOUNTER — Telehealth: Payer: Self-pay | Admitting: Gastroenterology

## 2023-11-06 MED ORDER — ZENPEP 40000-126000 UNITS PO CPEP
ORAL_CAPSULE | ORAL | 0 refills | Status: DC
Start: 2023-11-06 — End: 2024-01-03

## 2023-11-06 NOTE — Telephone Encounter (Signed)
 30 day supply of Zenpep  sent to pharmacy

## 2023-11-06 NOTE — Telephone Encounter (Signed)
 Inbound call from patient requesting a refill for Zenpep . Patient has been scheduled for 6/20. Please advise, thank you

## 2023-11-08 ENCOUNTER — Other Ambulatory Visit: Payer: Self-pay | Admitting: Internal Medicine

## 2023-11-09 ENCOUNTER — Ambulatory Visit: Admitting: Physician Assistant
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
## 2023-11-14 ENCOUNTER — Ambulatory Visit: Admitting: Physician Assistant
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
## 2023-11-27 ENCOUNTER — Ambulatory Visit: Admitting: Physician Assistant

## 2023-12-10 ENCOUNTER — Ambulatory Visit: Admitting: Orthopedic Surgery

## 2023-12-10 ENCOUNTER — Encounter: Payer: Self-pay | Admitting: Orthopedic Surgery

## 2023-12-10 DIAGNOSIS — M19011 Primary osteoarthritis, right shoulder: Secondary | ICD-10-CM
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
## 2023-12-20 ENCOUNTER — Other Ambulatory Visit: Payer: Self-pay

## 2023-12-20 ENCOUNTER — Encounter (HOSPITAL_COMMUNITY): Payer: Self-pay

## 2023-12-20 ENCOUNTER — Emergency Department (HOSPITAL_COMMUNITY)

## 2023-12-20 ENCOUNTER — Observation Stay (HOSPITAL_COMMUNITY)
Admission: EM | Admit: 2023-12-20 | Discharge: 2023-12-21 | Disposition: A | Discharge status: Home / Self Care | Attending: Internal Medicine | Admitting: Internal Medicine

## 2023-12-20 DIAGNOSIS — I2510 Atherosclerotic heart disease of native coronary artery without angina pectoris: Secondary | ICD-10-CM | POA: Diagnosis not present

## 2023-12-20 DIAGNOSIS — E876 Hypokalemia: Secondary | ICD-10-CM | POA: Insufficient documentation

## 2023-12-20 DIAGNOSIS — R0602 Shortness of breath: Secondary | ICD-10-CM | POA: Diagnosis present

## 2023-12-20 DIAGNOSIS — Z96643 Presence of artificial hip joint, bilateral: Secondary | ICD-10-CM | POA: Insufficient documentation

## 2023-12-20 DIAGNOSIS — Z7952 Long term (current) use of systemic steroids: Secondary | ICD-10-CM | POA: Diagnosis not present

## 2023-12-20 DIAGNOSIS — F32A Depression, unspecified: Secondary | ICD-10-CM | POA: Insufficient documentation

## 2023-12-20 DIAGNOSIS — R531 Weakness: Secondary | ICD-10-CM | POA: Diagnosis not present

## 2023-12-20 DIAGNOSIS — Z89511 Acquired absence of right leg below knee: Secondary | ICD-10-CM | POA: Insufficient documentation

## 2023-12-20 DIAGNOSIS — I10 Essential (primary) hypertension: Secondary | ICD-10-CM | POA: Diagnosis not present

## 2023-12-20 DIAGNOSIS — L97919 Non-pressure chronic ulcer of unspecified part of right lower leg with unspecified severity: Secondary | ICD-10-CM | POA: Insufficient documentation

## 2023-12-20 DIAGNOSIS — I959 Hypotension, unspecified: Principal | ICD-10-CM | POA: Insufficient documentation

## 2023-12-20 DIAGNOSIS — G8929 Other chronic pain: Secondary | ICD-10-CM | POA: Insufficient documentation

## 2023-12-20 DIAGNOSIS — Z7982 Long term (current) use of aspirin: Secondary | ICD-10-CM | POA: Diagnosis not present

## 2023-12-20 DIAGNOSIS — Z87891 Personal history of nicotine dependence: Secondary | ICD-10-CM | POA: Diagnosis not present

## 2023-12-20 DIAGNOSIS — E1159 Type 2 diabetes mellitus with other circulatory complications: Secondary | ICD-10-CM | POA: Diagnosis present

## 2023-12-20 DIAGNOSIS — Z95828 Presence of other vascular implants and grafts: Secondary | ICD-10-CM | POA: Insufficient documentation

## 2023-12-20 DIAGNOSIS — Z96653 Presence of artificial knee joint, bilateral: Secondary | ICD-10-CM | POA: Insufficient documentation

## 2023-12-20 DIAGNOSIS — S80212A Abrasion, left knee, initial encounter: Secondary | ICD-10-CM | POA: Diagnosis present

## 2023-12-20 DIAGNOSIS — N179 Acute kidney failure, unspecified: Secondary | ICD-10-CM | POA: Diagnosis not present

## 2023-12-20 DIAGNOSIS — I152 Hypertension secondary to endocrine disorders: Secondary | ICD-10-CM | POA: Diagnosis present

## 2023-12-20 DIAGNOSIS — M6281 Muscle weakness (generalized): Secondary | ICD-10-CM | POA: Diagnosis not present

## 2023-12-20 DIAGNOSIS — E119 Type 2 diabetes mellitus without complications: Secondary | ICD-10-CM | POA: Diagnosis not present

## 2023-12-20 DIAGNOSIS — R001 Bradycardia, unspecified: Secondary | ICD-10-CM | POA: Diagnosis not present

## 2023-12-20 DIAGNOSIS — Z79899 Other long term (current) drug therapy: Secondary | ICD-10-CM | POA: Insufficient documentation

## 2023-12-20 DIAGNOSIS — E785 Hyperlipidemia, unspecified: Secondary | ICD-10-CM | POA: Insufficient documentation

## 2023-12-20 DIAGNOSIS — W19XXXA Unspecified fall, initial encounter: Secondary | ICD-10-CM | POA: Insufficient documentation

## 2023-12-20 DIAGNOSIS — D649 Anemia, unspecified: Secondary | ICD-10-CM | POA: Diagnosis not present

## 2023-12-20 DIAGNOSIS — L97819 Non-pressure chronic ulcer of other part of right lower leg with unspecified severity: Secondary | ICD-10-CM | POA: Diagnosis not present

## 2023-12-20 DIAGNOSIS — F419 Anxiety disorder, unspecified: Secondary | ICD-10-CM | POA: Diagnosis not present

## 2023-12-20 DIAGNOSIS — E872 Acidosis, unspecified: Secondary | ICD-10-CM | POA: Diagnosis not present

## 2023-12-20 DIAGNOSIS — E1169 Type 2 diabetes mellitus with other specified complication: Secondary | ICD-10-CM | POA: Diagnosis present

## 2023-12-20 DIAGNOSIS — G894 Chronic pain syndrome: Secondary | ICD-10-CM | POA: Insufficient documentation

## 2023-12-20 DIAGNOSIS — R7989 Other specified abnormal findings of blood chemistry: Secondary | ICD-10-CM | POA: Insufficient documentation

## 2023-12-20 DIAGNOSIS — Z7985 Long-term (current) use of injectable non-insulin antidiabetic drugs: Secondary | ICD-10-CM | POA: Insufficient documentation

## 2023-12-20 DIAGNOSIS — R55 Syncope and collapse: Secondary | ICD-10-CM | POA: Diagnosis not present

## 2023-12-20 LAB — COMPREHENSIVE METABOLIC PANEL WITH GFR
ALT: 11 U/L (ref 0–44)
AST: 21 U/L (ref 15–41)
Albumin: 3.1 g/dL — ABNORMAL LOW (ref 3.5–5.0)
Alkaline Phosphatase: 59 U/L (ref 38–126)
Anion gap: 13 (ref 5–15)
BUN: 30 mg/dL — ABNORMAL HIGH (ref 8–23)
CO2: 23 mmol/L (ref 22–32)
Calcium: 8.1 mg/dL — ABNORMAL LOW (ref 8.9–10.3)
Chloride: 99 mmol/L (ref 98–111)
Creatinine, Ser: 2.34 mg/dL — ABNORMAL HIGH (ref 0.61–1.24)
GFR, Estimated: 29 mL/min — ABNORMAL LOW
Glucose, Bld: 175 mg/dL — ABNORMAL HIGH (ref 70–99)
Potassium: 3.1 mmol/L — ABNORMAL LOW (ref 3.5–5.1)
Sodium: 135 mmol/L (ref 135–145)
Total Bilirubin: 1 mg/dL (ref 0.0–1.2)
Total Protein: 5.3 g/dL — ABNORMAL LOW (ref 6.5–8.1)

## 2023-12-20 LAB — CBC WITH DIFFERENTIAL/PLATELET
Abs Immature Granulocytes: 0.02 K/uL (ref 0.00–0.07)
Basophils Absolute: 0 K/uL (ref 0.0–0.1)
Basophils Relative: 0 %
Eosinophils Absolute: 0 K/uL (ref 0.0–0.5)
Eosinophils Relative: 1 %
HCT: 34.1 % — ABNORMAL LOW (ref 39.0–52.0)
Hemoglobin: 11 g/dL — ABNORMAL LOW (ref 13.0–17.0)
Immature Granulocytes: 0 %
Lymphocytes Relative: 10 %
Lymphs Abs: 0.5 K/uL — ABNORMAL LOW (ref 0.7–4.0)
MCH: 30.1 pg (ref 26.0–34.0)
MCHC: 32.3 g/dL (ref 30.0–36.0)
MCV: 93.2 fL (ref 80.0–100.0)
Monocytes Absolute: 0.2 K/uL (ref 0.1–1.0)
Monocytes Relative: 5 %
Neutro Abs: 3.9 K/uL (ref 1.7–7.7)
Neutrophils Relative %: 84 %
Platelets: 212 K/uL (ref 150–400)
RBC: 3.66 MIL/uL — ABNORMAL LOW (ref 4.22–5.81)
RDW: 15.6 % — ABNORMAL HIGH (ref 11.5–15.5)
WBC: 4.7 K/uL (ref 4.0–10.5)
nRBC: 0 % (ref 0.0–0.2)

## 2023-12-20 LAB — URINALYSIS, ROUTINE W REFLEX MICROSCOPIC
Bilirubin Urine: NEGATIVE
Glucose, UA: NEGATIVE mg/dL
Hgb urine dipstick: NEGATIVE
Ketones, ur: NEGATIVE mg/dL
Nitrite: NEGATIVE
Protein, ur: 30 mg/dL — AB
Specific Gravity, Urine: 1.02 (ref 1.005–1.030)
WBC, UA: >50 WBC/hpf (ref 0–5)
pH: 5 (ref 5.0–8.0)

## 2023-12-20 LAB — I-STAT CG4 LACTIC ACID, ED
Lactic Acid, Venous: 3 mmol/L (ref 0.5–1.9)
Lactic Acid, Venous: 2.1 mmol/L (ref 0.5–1.9)

## 2023-12-20 LAB — I-STAT CHEM 8, ED
BUN: 37 mg/dL — ABNORMAL HIGH (ref 8–23)
Calcium, Ion: 1.02 mmol/L — ABNORMAL LOW (ref 1.15–1.40)
Chloride: 99 mmol/L (ref 98–111)
Creatinine, Ser: 2.3 mg/dL — ABNORMAL HIGH (ref 0.61–1.24)
Glucose, Bld: 170 mg/dL — ABNORMAL HIGH (ref 70–99)
HCT: 33 % — ABNORMAL LOW (ref 39.0–52.0)
Hemoglobin: 11.2 g/dL — ABNORMAL LOW (ref 13.0–17.0)
Potassium: 3.5 mmol/L (ref 3.5–5.1)
Sodium: 135 mmol/L (ref 135–145)
TCO2: 25 mmol/L (ref 22–32)

## 2023-12-20 LAB — MAGNESIUM: Magnesium: 2 mg/dL (ref 1.7–2.4)

## 2023-12-20 LAB — TROPONIN I (HIGH SENSITIVITY)
Troponin I (High Sensitivity): 159 ng/L (ref <18)
Troponin I (High Sensitivity): 130 ng/L (ref <18)

## 2023-12-20 LAB — BRAIN NATRIURETIC PEPTIDE: B Natriuretic Peptide: 42.3 pg/mL (ref 0.0–100.0)

## 2023-12-20 MED ORDER — DULOXETINE HCL 60 MG PO CPEP
60.0000 mg | ORAL_CAPSULE | Freq: Two times a day (BID) | ORAL | Status: DC
  Administered 2023-12-20 – 2023-12-21 (×2): 60 mg via ORAL
  Filled 2023-12-20: qty 2
  Filled 2023-12-20: qty 1

## 2023-12-20 MED ORDER — ONDANSETRON HCL 4 MG/2ML IJ SOLN: 4.0000 mg | Freq: Four times a day (QID) | INTRAMUSCULAR | Status: DC | PRN

## 2023-12-20 MED ORDER — INSULIN ASPART 100 UNIT/ML IJ SOLN: 0.0000 [IU] | Freq: Three times a day (TID) | INTRAMUSCULAR | Status: DC

## 2023-12-20 MED ORDER — ATORVASTATIN CALCIUM 10 MG PO TABS
20.0000 mg | ORAL_TABLET | Freq: Every evening | ORAL | Status: DC
  Administered 2023-12-20: 20 mg via ORAL
  Filled 2023-12-20: qty 2

## 2023-12-20 MED ORDER — ASPIRIN 81 MG PO TBEC: 81.0000 mg | DELAYED_RELEASE_TABLET | Freq: Every day | ORAL | Status: DC

## 2023-12-20 MED ORDER — ASPIRIN 81 MG PO CHEW
324.0000 mg | CHEWABLE_TABLET | Freq: Once | ORAL | Status: AC
  Administered 2023-12-20: 324 mg via ORAL
  Filled 2023-12-20: qty 4

## 2023-12-20 MED ORDER — HEPARIN SODIUM (PORCINE) 5000 UNIT/ML IJ SOLN
5000.0000 [IU] | Freq: Three times a day (TID) | INTRAMUSCULAR | Status: DC
  Administered 2023-12-20 – 2023-12-21 (×3): 5000 [IU] via SUBCUTANEOUS
  Filled 2023-12-20 (×3): qty 1

## 2023-12-20 MED ORDER — OXYCODONE HCL 5 MG PO TABS
5.0000 mg | ORAL_TABLET | Freq: Four times a day (QID) | ORAL | Status: DC
  Administered 2023-12-21: 5 mg via ORAL
  Filled 2023-12-20: qty 1

## 2023-12-20 MED ORDER — LACTATED RINGERS IV SOLN: INTRAVENOUS | Status: AC

## 2023-12-20 MED ORDER — SODIUM CHLORIDE 0.9% FLUSH
3.0000 mL | Freq: Two times a day (BID) | INTRAVENOUS | Status: DC
  Administered 2023-12-21: 3 mL via INTRAVENOUS

## 2023-12-20 MED ORDER — MUPIROCIN 2 % EX OINT
TOPICAL_OINTMENT | Freq: Every day | CUTANEOUS | Status: DC
  Filled 2023-12-20 (×2): qty 22

## 2023-12-20 MED ORDER — ACETAMINOPHEN 650 MG RE SUPP: 650.0000 mg | Freq: Four times a day (QID) | RECTAL | Status: DC | PRN

## 2023-12-20 MED ORDER — DONEPEZIL HCL 10 MG PO TABS
10.0000 mg | ORAL_TABLET | Freq: Every day | ORAL | Status: DC
  Administered 2023-12-20: 10 mg via ORAL
  Filled 2023-12-20: qty 1

## 2023-12-20 MED ORDER — ACETAMINOPHEN 325 MG PO TABS: 650.0000 mg | ORAL_TABLET | Freq: Four times a day (QID) | ORAL | Status: DC | PRN

## 2023-12-20 MED ORDER — ARIPIPRAZOLE 5 MG PO TABS
15.0000 mg | ORAL_TABLET | Freq: Every evening | ORAL | Status: DC
  Administered 2023-12-20: 15 mg via ORAL
  Filled 2023-12-20: qty 2

## 2023-12-20 MED ORDER — CLINDAMYCIN HCL 300 MG PO CAPS
300.0000 mg | ORAL_CAPSULE | Freq: Two times a day (BID) | ORAL | Status: DC
  Administered 2023-12-20 – 2023-12-21 (×2): 300 mg via ORAL
  Filled 2023-12-20 (×3): qty 1
  Filled 2023-12-20: qty 2

## 2023-12-20 MED ORDER — ONDANSETRON HCL 4 MG PO TABS: 4.0000 mg | ORAL_TABLET | Freq: Four times a day (QID) | ORAL | Status: DC | PRN

## 2023-12-20 MED ORDER — LACTATED RINGERS IV BOLUS
500.0000 mL | Freq: Once | INTRAVENOUS | Status: AC
  Administered 2023-12-20: 500 mL via INTRAVENOUS

## 2023-12-20 MED ORDER — PRASUGREL HCL 10 MG PO TABS
5.0000 mg | ORAL_TABLET | Freq: Every day | ORAL | Status: DC
  Administered 2023-12-21: 5 mg via ORAL
  Filled 2023-12-20 (×3): qty 1

## 2023-12-20 MED ORDER — OXYCODONE-ACETAMINOPHEN 10-325 MG PO TABS: 1.0000 | ORAL_TABLET | Freq: Four times a day (QID) | ORAL | Status: DC

## 2023-12-20 MED ORDER — FAMOTIDINE 20 MG PO TABS
40.0000 mg | ORAL_TABLET | Freq: Every day | ORAL | Status: DC
  Administered 2023-12-20: 40 mg via ORAL
  Filled 2023-12-20: qty 2

## 2023-12-20 MED ORDER — OXYCODONE-ACETAMINOPHEN 5-325 MG PO TABS
1.0000 | ORAL_TABLET | Freq: Four times a day (QID) | ORAL | Status: DC
  Administered 2023-12-21: 1 via ORAL
  Filled 2023-12-20: qty 1

## 2023-12-20 MED ORDER — SENNOSIDES-DOCUSATE SODIUM 8.6-50 MG PO TABS: 1.0000 | ORAL_TABLET | Freq: Every evening | ORAL | Status: DC | PRN

## 2023-12-20 MED ORDER — TRAZODONE HCL 50 MG PO TABS
150.0000 mg | ORAL_TABLET | Freq: Every day | ORAL | Status: DC
  Administered 2023-12-20: 150 mg via ORAL
  Filled 2023-12-20: qty 3

## 2023-12-20 MED ORDER — PREDNISONE 20 MG PO TABS
20.0000 mg | ORAL_TABLET | Freq: Every day | ORAL | Status: DC
  Administered 2023-12-21: 20 mg via ORAL
  Filled 2023-12-20: qty 1

## 2023-12-20 MED ORDER — POTASSIUM CHLORIDE 20 MEQ PO PACK
40.0000 meq | PACK | Freq: Once | ORAL | Status: AC
  Administered 2023-12-20: 40 meq via ORAL
  Filled 2023-12-20: qty 2

## 2023-12-20 NOTE — ED Notes (Signed)
 Patients wound wiped with water . Ointment and new bandage applied.

## 2023-12-20 NOTE — ED Notes (Signed)
 Pt manual BP was 56/42 left arm manual. EDP Kammerer advised and came to bedside

## 2023-12-20 NOTE — ED Provider Notes (Addendum)
 [place] EMERGENCY DEPARTMENT AT [facility] Provider Note   CSN: [id number] Arrival date & time: 12/20/23  1552     Patient presents with: Travis [name]. is a 73 y.o. male.   73 year old male presents for evaluation of fall.  States he felt very weak at home, and his legs gave out he landed on his left knee.  He has a right BKA.  Patient states over the last day he has felt very weak and fatigued.  States he fell asleep yesterday and woke up today.  States he did not take his antihypertensives last night.  He states other than that he has had no symptoms.   Fall Pertinent negatives include no chest pain, no abdominal pain and no shortness of breath.       Prior to Admission medications   Medication Sig Start Date End Date Taking? Authorizing Provider  ARIPiprazole  (ABILIFY ) 15 MG tablet Take 15 mg by mouth every evening. 08/02/23  Yes [provider]  aspirin  EC 81 MG tablet Take 81 mg by mouth at bedtime. Swallow whole.   Yes [provider]  atorvastatin  (LIPITOR) 20 MG tablet TAKE ONE TABLET BY MOUTH EVERY DAY Patient taking differently: Take 20 mg by mouth every evening. 03/08/23  Yes Verlin Lonni BIRCH, MD  celecoxib  (CELEBREX ) 200 MG capsule Take 1 capsule (200 mg total) by mouth 2 (two) times daily. 07/04/21  Yes Nitka, James E, MD  clindamycin  (CLEOCIN ) 300 MG capsule Take 1 capsule by mouth every 12 (twelve) hours. 12/13/23 12/23/23 Yes [provider]  donepezil  (ARICEPT ) 10 MG tablet Take 1 tablet (10 mg total) by mouth at bedtime. 08/07/22  Yes Norleen Lynwood ORN, MD  DULoxetine  (CYMBALTA ) 60 MG capsule Take 60 mg by mouth 2 (two) times daily. 08/02/23  Yes [provider]  emtricitabine-tenofovir (TRUVADA) 200-300 MG tablet Take 1 tablet by mouth daily. 08/06/23  Yes [provider]  famotidine  (PEPCID ) 40 MG tablet Take 40 mg by mouth at bedtime. 05/10/23  Yes [provider]  fluticasone   (FLONASE ) 50 MCG/ACT nasal spray instill TWO SPRAYS IN EACH NOSTRIL EVERY night 05/09/23  Yes [provider]  gabapentin  (NEURONTIN ) 600 MG tablet Take 1,200 mg by mouth 3 (three) times daily.   Yes [provider]  hydrochlorothiazide  (HYDRODIURIL ) 25 MG tablet Take 25 mg by mouth daily.   Yes [provider]  losartan  (COZAAR ) 50 MG tablet TAKE ONE TABLET BY MOUTH EVERY DAY 04/24/22  Yes Norleen Lynwood ORN, MD  methocarbamol  (ROBAXIN ) 500 MG tablet Take 500 mg by mouth every 8 (eight) hours. 08/11/23  Yes [provider]  MOUNJARO 10 MG/0.5ML Pen SMARTSIG:10 Milligram(s) SUB-Q Once a Week 04/09/23  Yes [provider]  mupirocin  ointment (BACTROBAN ) 2 % Apply thin amount to wound and cover with light dressing or Bandaid. Change daily 07/02/23  Yes McCaughan, Dia D, DPM  Omega-3 Fatty Acids (FISH OIL) 1000 MG CAPS Take 2,000 mg by mouth daily.   Yes [provider]  oxyCODONE -acetaminophen  (PERCOCET) 10-325 MG tablet Take 1 tablet by mouth 4 (four) times daily.   Yes [provider]  Pancrelipase , Lip-Prot-Amyl, (ZENPEP ) 40000-126000 units CPEP Take 3 capsules with meals and 2 capsule with each snack (2-3 snacks/day) Patient taking differently: Take 4 capsules with meals and 2 capsule with each snack (2-3 snacks/day) 11/06/23  Yes Armbruster, Elspeth SQUIBB, MD  pantoprazole  (PROTONIX ) 40 MG tablet Take 40 mg by mouth 3 (three) times daily.  Yes [provider]  prasugrel  (EFFIENT ) 5 MG TABS tablet Take 1 tablet (5 mg total) by mouth daily. 01/30/23  Yes Verlin Lonni BIRCH, MD  predniSONE  (DELTASONE ) 20 MG tablet Take 20 mg by mouth daily. 07/30/23  Yes [provider]  sildenafil  (VIAGRA ) 50 MG tablet Take 1 tablet (50 mg total) by mouth daily as needed for erectile dysfunction. 01/16/23  Yes Conte, Tessa N, PA-C  silver  sulfADIAZINE  (SILVADENE ) 1 % cream Apply 1 Application topically daily. Patient taking differently: Apply 1  Application topically daily as needed (for burn,scars). 01/26/23  Yes Harden Jerona GAILS, MD  tamsulosin  (FLOMAX ) 0.4 MG CAPS capsule Take 0.4 mg by mouth 2 (two) times daily. 06/30/20  Yes [provider]  Testosterone  1.62 % GEL place two pumps onto THE SKIN EVERY DAY 05/08/22  Yes Norleen Lynwood ORN, MD  traZODone  (DESYREL ) 150 MG tablet Take 1 tablet (150 mg total) by mouth at bedtime. 06/28/22  Yes Arfeen, Leni DASEN, MD  zolpidem  (AMBIEN ) 10 MG tablet Take 10 mg by mouth at bedtime. 07/24/23  Yes [provider]  gabapentin  (NEURONTIN ) 800 MG tablet Take 800 mg by mouth 3 (three) times daily. Patient not taking: Reported on 12/20/2023 12/20/20   Nitka, James E, MD  hydrOXYzine  (ATARAX ) 50 MG tablet Take 50 mg by mouth every 8 (eight) hours as needed. Patient not taking: Reported on 08/15/2023 08/06/23   [provider]  naloxone [facility]) nasal spray 4 mg/0.1 mL SMARTSIG:Both Nares 04/23/23   [provider]    Allergies: Buprenorphine  and Neosporin [bacitracin -polymyxin b]    Review of Systems  Constitutional:  Positive for fatigue. Negative for chills and fever.  HENT:  Negative for ear pain and sore throat.   Eyes:  Negative for pain and visual disturbance.  Respiratory:  Negative for cough and shortness of breath.   Cardiovascular:  Negative for chest pain and palpitations.  Gastrointestinal:  Negative for abdominal pain and vomiting.  Genitourinary:  Negative for dysuria and hematuria.  Musculoskeletal:  Negative for arthralgias and back pain.  Skin:  Negative for color change and rash.  Neurological:  Positive for weakness and light-headedness. Negative for seizures and syncope.  All other systems reviewed and are negative.   Updated Vital Signs BP (!) 91/57 (BP Location: Right Arm)   Pulse 77   Temp 97.9 F (36.6 C) (Oral)   Resp 18   Ht 6' (1.829 m)   Wt 88.5 kg   SpO2 100%   BMI 26.45 kg/m   Physical Exam Vitals and nursing note reviewed.   Constitutional:      General: He is not in acute distress.    Appearance: Normal appearance. He is well-developed. He is not ill-appearing.  HENT:     Head: Normocephalic and atraumatic.  Eyes:     Conjunctiva/sclera: Conjunctivae normal.  Cardiovascular:     Rate and Rhythm: Regular rhythm. Bradycardia present.     Heart sounds: Normal heart sounds. No murmur heard. Pulmonary:     Effort: Pulmonary effort is normal. No respiratory distress.     Breath sounds: Normal breath sounds. No stridor. No wheezing or rhonchi.  Abdominal:     General: There is no distension.     Palpations: Abdomen is soft. There is no mass.     Tenderness: There is no abdominal tenderness.     Hernia: No hernia is present.  Musculoskeletal:        General: No swelling.     Cervical back:  Neck supple.     Comments: Full rom of left knee, abrasion to left knee, right BKA  Skin:    General: Skin is warm and dry.     Capillary Refill: Capillary refill takes less than 2 seconds.  Neurological:     Mental Status: He is alert.  Psychiatric:        Mood and Affect: Mood normal.     (all labs ordered are listed, but only abnormal results are displayed) Labs Reviewed  CBC WITH DIFFERENTIAL/PLATELET - Abnormal; Notable for the following components:      Result Value   RBC 3.66 (*)    Hemoglobin 11.0 (*)    HCT 34.1 (*)    RDW 15.6 (*)    Lymphs Abs 0.5 (*)    All other components within normal limits  COMPREHENSIVE METABOLIC PANEL WITH GFR - Abnormal; Notable for the following components:   Potassium 3.1 (*)    Glucose, Bld 175 (*)    BUN 30 (*)    Creatinine, Ser 2.34 (*)    Calcium  8.1 (*)    Total Protein 5.3 (*)    Albumin  3.1 (*)    GFR, Estimated 29 (*)    All other components within normal limits  URINALYSIS, ROUTINE W REFLEX MICROSCOPIC - Abnormal; Notable for the following components:   Color, Urine AMBER (*)    APPearance CLOUDY (*)    Protein, ur 30 (*)    Leukocytes,Ua SMALL (*)     Bacteria, UA RARE (*)    All other components within normal limits  I-STAT CG4 LACTIC ACID, ED - Abnormal; Notable for the following components:   Lactic Acid, Venous 3.0 (*)    All other components within normal limits  I-STAT CHEM 8, ED - Abnormal; Notable for the following components:   BUN 37 (*)    Creatinine, Ser 2.30 (*)    Glucose, Bld 170 (*)    Calcium , Ion 1.02 (*)    Hemoglobin 11.2 (*)    HCT 33.0 (*)    All other components within normal limits  I-STAT CG4 LACTIC ACID, ED - Abnormal; Notable for the following components:   Lactic Acid, Venous 2.1 (*)    All other components within normal limits  TROPONIN I (HIGH SENSITIVITY) - Abnormal; Notable for the following components:   Troponin I (High Sensitivity) 159 (*)    All other components within normal limits  TROPONIN I (HIGH SENSITIVITY) - Abnormal; Notable for the following components:   Troponin I (High Sensitivity) 130 (*)    All other components within normal limits  BRAIN NATRIURETIC PEPTIDE  MAGNESIUM   RAPID URINE DRUG SCREEN, HOSP PERFORMED  BASIC METABOLIC PANEL WITH GFR  CBC    EKG: EKG Interpretation Date/Time:  Thursday December 20 2023 16:07:57 EDT Ventricular Rate:  85 PR Interval:  184 QRS Duration:  108 QT Interval:  412 QTC Calculation: 490 R Axis:   1  Text Interpretation: Sinus rhythm Ventricular premature complex Borderline prolonged QT interval No significant change compared with previous EKG from 05/06/2023 Confirmed by Gennaro Bouchard ([id number]) on 12/20/2023 4:59:41 PM  Radiology: [facility] Chest 1 View Result Date: 12/20/2023 CLINICAL DATA:  Shortness of breath.  Fell onto the left knee. EXAM: CHEST  1 VIEW COMPARISON:  11/25/2021 FINDINGS: Shallow inspiration. Heart size and pulmonary vascularity are normal. Lungs are clear. No pleural effusions or pneumothorax. Degenerative changes in the spine and right shoulder. Postoperative reverse left shoulder arthroplasty. Postoperative changes in the  cervical spine.  Ribs are nondisplaced. IMPRESSION: No active disease. Electronically Signed   By: Elsie Gravely M.D.   On: 12/20/2023 16:59   DG Knee Complete 4 Views Left Result Date: 12/20/2023 CLINICAL DATA:  Shortness of breath. Fell on the left knee with open wounds anteriorly. EXAM: LEFT KNEE - COMPLETE 4+ VIEW COMPARISON:  12/13/2022 FINDINGS: Postoperative left total knee arthroplasty. Components appear well seated without significant change since prior study. No evidence of acute fracture or dislocation. No focal bone lesion or bone destruction. No significant effusions. Soft tissues are unremarkable. Vascular calcifications. IMPRESSION: Left total knee arthroplasty. Components appear well seated. No acute bony abnormalities. Electronically Signed   By: Elsie Gravely M.D.   On: 12/20/2023 16:58     Procedures   Medications Ordered in the ED  lactated ringers  infusion ( Intravenous New Bag/Given 12/20/23 2159)  heparin  injection 5,000 Units (5,000 Units Subcutaneous Given 12/20/23 2152)  sodium chloride  flush (NS) 0.9 % injection 3 mL (3 mLs Intravenous Not Given 12/20/23 2159)  acetaminophen  (TYLENOL ) tablet 650 mg (has no administration in time range)    Or  acetaminophen  (TYLENOL ) suppository 650 mg (has no administration in time range)  ondansetron  (ZOFRAN ) tablet 4 mg (has no administration in time range)    Or  ondansetron  (ZOFRAN ) injection 4 mg (has no administration in time range)  senna-docusate (Senokot-S) tablet 1 tablet (has no administration in time range)  aspirin  EC tablet 81 mg (has no administration in time range)  clindamycin  (CLEOCIN ) capsule 300 mg (300 mg Oral Given 12/20/23 2152)  atorvastatin  (LIPITOR) tablet 20 mg (20 mg Oral Given 12/20/23 2152)  ARIPiprazole  (ABILIFY ) tablet 15 mg (15 mg Oral Given 12/20/23 2205)  donepezil  (ARICEPT ) tablet 10 mg (10 mg Oral Given 12/20/23 2204)  DULoxetine  (CYMBALTA ) DR capsule 60 mg (60 mg Oral Given 12/20/23 2152)   traZODone  (DESYREL ) tablet 150 mg (150 mg Oral Given 12/20/23 2152)  famotidine  (PEPCID ) tablet 40 mg (40 mg Oral Given 12/20/23 2151)  prasugrel  (EFFIENT ) tablet 5 mg (has no administration in time range)  mupirocin  ointment (BACTROBAN ) 2 % ( Topical Given 12/20/23 2205)  insulin  aspart (novoLOG ) injection 0-9 Units (has no administration in time range)  predniSONE  (DELTASONE ) tablet 20 mg (has no administration in time range)  oxyCODONE -acetaminophen  (PERCOCET/ROXICET) 5-325 MG per tablet 1 tablet (has no administration in time range)    And  oxyCODONE  (Oxy IR/ROXICODONE ) immediate release tablet 5 mg (has no administration in time range)  lactated ringers  bolus 500 mL (0 mLs Intravenous Stopped 12/20/23 1637)  lactated ringers  bolus 500 mL (0 mLs Intravenous Stopped 12/20/23 1903)  aspirin  chewable tablet 324 mg (324 mg Oral Given 12/20/23 1830)  potassium chloride  (KLOR-CON ) packet 40 mEq (40 mEq Oral Given 12/20/23 2152)                                    Medical Decision Making Cardiac monitor interpretation: sinus rhythm, no ectopy   Patient here for knee pain after a near syncopal episode.  He was profoundly hypotensive on arrival with a blood pressure in the 50s.  Mentating well.  Required multiple IV fluid boluses of 500 cc at a time which she did transiently improve with.  Blood pressure would keep falling back down to the 80s.  He required multiple rechecks for blood pressure management.  Patient did state that he takes 5-7 200 mg caffeine pills daily as well.  He has  no other symptoms other than fatigue and lightheadedness and near syncope earlier today.  No tachycardia, fever or signs of sepsis at this time.  Lactate was elevated and he did have an AKI as well as elevated troponin.  No chest pain or EKG changes.  I discussed patient's case with hospitalist and patient will be admitted for further workup and management.  Patient was agreeable with the plan.  Problems Addressed: AKI  (acute kidney injury) (HCC): acute illness or injury that poses a threat to life or bodily functions Elevated troponin: undiagnosed new problem with uncertain prognosis Hypotension, unspecified hypotension type: undiagnosed new problem with uncertain prognosis Near syncope: acute illness or injury  Amount and/or Complexity of Data Reviewed External Data Reviewed: notes.    Details: Outpatient records reviewed and patient is on amlodipine  and valsartan for blood pressure management, and he does have a history of coronary artery disease with stenting in the past Labs: ordered. Decision-making details documented in ED Course.    Details: Labs ordered and reviewed by me and first troponin is elevated, his he has an AKI as well and elevated lactic acid but no leukocytosis Radiology: ordered and independent interpretation performed. Decision-making details documented in ED Course.    Details: Ordered and interpreted by me dependently of radiology Chest x-ray: Shows no acute abnormality in the chest Left knee x-ray: Shows no acute fracture or abnormality ECG/medicine tests: ordered and independent interpretation performed. Decision-making details documented in ED Course.    Details: EKG interpreted by me in the absence of cardiology and appears stable when compared to prior, sinus rhythm and no STEMI Discussion of management or test interpretation with external provider(s): Hospitalist-I spoke with Dr. Jorie -hospitalist and patient to be admitted for further workup and management.  Risk OTC drugs. Prescription drug management. Drug therapy requiring intensive monitoring for toxicity. Decision regarding hospitalization. Risk Details: CRITICAL CARE Performed by: Duwaine LITTIE Fusi   Total critical care time: 30 minutes  Critical care time was exclusive of separately billable procedures and treating other patients.  Critical care was necessary to treat or prevent imminent or life-threatening  deterioration.  Critical care was time spent personally by me on the following activities: development of treatment plan with patient and/or surrogate as well as nursing, discussions with consultants, evaluation of patient's response to treatment, examination of patient, obtaining history from patient or surrogate, ordering and performing treatments and interventions, ordering and review of laboratory studies, ordering and review of radiographic studies, pulse oximetry and re-evaluation of patient's condition.   Critical Care Total time providing critical care: 30 minutes    Final diagnoses:  Hypotension, unspecified hypotension type  AKI (acute kidney injury) (HCC)  Near syncope  Elevated troponin    ED Discharge Orders     None          Fusi Duwaine LITTIE, DO 12/20/23 2232    Fusi Duwaine L, DO 12/20/23 2245

## 2023-12-20 NOTE — ED Notes (Signed)
 CCMD called, pt on monitor

## 2023-12-20 NOTE — Hospital Course (Signed)
 Travis DIEL Sr. is a 73 y.o. male with medical history significant for CAD s/p multiple stenting, T2DM, HTN, HLD, chronic pain syndrome, depression/anxiety, s/p right BKA, chronic right knee ulcer who is admitted with hypotension associated with AKI.

## 2023-12-20 NOTE — ED Notes (Signed)
 PT states he will not leave AMA now

## 2023-12-20 NOTE — ED Notes (Signed)
 X-ray at bedside

## 2023-12-20 NOTE — ED Notes (Signed)
 Pt states he took 7 of his caffeine pills today. Pt states he does not know the dosage but thinks they are 2000mg  EDP advised

## 2023-12-20 NOTE — ED Notes (Signed)
 Pt states he wants to leave AMA. He is agreeable to more fluids and blood work due 1807 and will wait for results but states he can not stay
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
## 2023-12-20 NOTE — ED Triage Notes (Signed)
 [facility] reports pt coming from home. Pt states his legs gave out and pt pt lowered himself to the floor landing on his left knee. Pt c/o left knee pain and weakness. Pt denies any neck or back pain or loc. Pt also c/o weakness the past few weeks.

## 2023-12-21 DIAGNOSIS — R55 Syncope and collapse: Secondary | ICD-10-CM

## 2023-12-21 DIAGNOSIS — N179 Acute kidney failure, unspecified: Secondary | ICD-10-CM

## 2023-12-21 DIAGNOSIS — R7989 Other specified abnormal findings of blood chemistry: Secondary | ICD-10-CM

## 2023-12-21 DIAGNOSIS — I959 Hypotension, unspecified: Secondary | ICD-10-CM | POA: Diagnosis not present

## 2023-12-21 LAB — CBC
HCT: 31.9 % — ABNORMAL LOW (ref 39.0–52.0)
Hemoglobin: 10 g/dL — ABNORMAL LOW (ref 13.0–17.0)
MCH: 29.9 pg (ref 26.0–34.0)
MCHC: 31.3 g/dL (ref 30.0–36.0)
MCV: 95.5 fL (ref 80.0–100.0)
Platelets: 175 K/uL (ref 150–400)
RBC: 3.34 MIL/uL — ABNORMAL LOW (ref 4.22–5.81)
RDW: 15.1 % (ref 11.5–15.5)
WBC: 4.7 K/uL (ref 4.0–10.5)
nRBC: 0 % (ref 0.0–0.2)

## 2023-12-21 LAB — BASIC METABOLIC PANEL WITH GFR
Anion gap: 11 (ref 5–15)
BUN: 30 mg/dL — ABNORMAL HIGH (ref 8–23)
CO2: 22 mmol/L (ref 22–32)
Calcium: 8.1 mg/dL — ABNORMAL LOW (ref 8.9–10.3)
Chloride: 102 mmol/L (ref 98–111)
Creatinine, Ser: 1.74 mg/dL — ABNORMAL HIGH (ref 0.61–1.24)
GFR, Estimated: 41 mL/min — ABNORMAL LOW (ref >60)
Glucose, Bld: 151 mg/dL — ABNORMAL HIGH (ref 70–99)
Potassium: 4.1 mmol/L (ref 3.5–5.1)
Sodium: 135 mmol/L (ref 135–145)

## 2023-12-21 LAB — GLUCOSE, CAPILLARY: Glucose-Capillary: 103 mg/dL — ABNORMAL HIGH (ref 70–99)

## 2023-12-21 LAB — CBG MONITORING, ED: Glucose-Capillary: 101 mg/dL — ABNORMAL HIGH (ref 70–99)

## 2023-12-21 MED ORDER — MELATONIN 3 MG PO TABS
3.0000 mg | ORAL_TABLET | Freq: Once | ORAL | Status: AC
  Administered 2023-12-21: 3 mg via ORAL
  Filled 2023-12-21: qty 1

## 2023-12-21 MED ORDER — TAMSULOSIN HCL 0.4 MG PO CAPS: 0.4000 mg | ORAL_CAPSULE | Freq: Two times a day (BID) | ORAL | Status: DC

## 2023-12-21 MED ORDER — TAMSULOSIN HCL 0.4 MG PO CAPS: 0.4000 mg | ORAL_CAPSULE | Freq: Every day | ORAL | Status: DC

## 2023-12-21 MED ORDER — LACTATED RINGERS IV BOLUS
250.0000 mL | Freq: Once | INTRAVENOUS | Status: AC
  Administered 2023-12-21: 250 mL via INTRAVENOUS

## 2023-12-21 NOTE — Care Management Obs Status (Signed)
 MEDICARE OBSERVATION STATUS NOTIFICATION   Patient Details  Name: Travis GALLICK Sr. MRN: [id number] Date of Birth: 02/09/1951   Medicare Observation Status Notification Given:  Yes  By phone obs explained and copy mailed as per patient request/.  Claretta Deed 12/21/2023, 4:12 PM

## 2023-12-21 NOTE — Discharge Summary (Signed)
 DISCHARGE SUMMARY  Travis [name]  MR#: [id number]  DOB:[birth year]  Date of Admission: 12/20/2023 Date of Discharge: 12/21/2023  Attending Physician:Travis [name] ONEIDA Moores, MD  Patient's ERE:Dryfzmhz, Travis NOVAK, NP  Disposition: D/C home   Follow-up Appts:  Follow-up Information     Schmerge, Travis NOVAK, NP Follow up in 1 week(s).   Specialty: Nurse Practitioner Contact information: [address] [place] [address] [phone]                 Tests Needing Follow-up: -assess BP and renal function - determine if ARB can be resumed safely   Discharge Diagnoses: Hypotension - lactic acidosis Acute kidney injury History of CAD status post multiple stents - elevated troponin Hypokalemia DM2 Normocytic anemia Chronic steroid use HLD Chronic right knee ulcer Chronic pain syndrome Depression/anxiety  Initial presentation: 73 year old with a history of CAD status post multiple stents, DM2, HTN, HLD, depression/anxiety, R BKA, chronic pain syndrome, and chronic R knee ulcer who presented to the ER after suffering a fall at home.  He awoke the morning of his presentation to find himself feeling very lethargic and weak.  He had not been sleeping well in the nights prior and had used trazodone , which induced a 21-hour sleep the night/day prior.  The day of his presentation, when attempting to get to work, he could not get out of a chair.  His [employer] driver attempted to help him get up and when doing so he fell scraping his knee on the cement.  EMS was summoned and he was brought to the ER where he was found to have a blood pressure of 56/42.  He was also noted to have a BUN of 30 with a creatinine of 2.34.   Hospital Course:  Hypotension - lactic acidosis No evidence of acute infection - appears to be simple volume depletion exacerbated by use of blood pressure medicines and diuretic - improved with volume expansion   Acute kidney injury Baseline creatinine  1.17 May 2023 - creatinine 2.34 at presentation  -likely prerenal and further complicated by use of losartan  and Celebrex  -renal function rapidly improving with simple volume expansion   History of CAD status post multiple stents - elevated troponin Trend is not consistent with acute ischemia -likely mild demand ischemia/hypoperfusion due to hypotension -no chest pain with unremarkable EKG   Hypokalemia Due to poor intake and use of diuretic -corrected with supplementation   DM2 On Mounjaro as outpatient -CBG well-controlled   Normocytic anemia Hemoglobin 10.0 with normal MCV -no evidence of gross blood loss during this admission with stable hemoglobin   Chronic steroid use Patient reported he has been on prednisone  20 mg daily for 3 months -indication unclear   HLD Continue atorvastatin    Chronic right knee ulcer Improving per patient -has utilized clindamycin  as an outpatient -continue usual clindamycin  dose as well as usual home wound care   Chronic pain syndrome Continue usual Percocet dose -followed at [facility]   Depression/anxiety Continue Cymbalta  and Abilify  at home doses  Allergies as of 12/21/2023       Reactions   Buprenorphine  Rash, Other (See Comments)   Neosporin [bacitracin -polymyxin B] Other (See Comments)        Medication List     STOP taking these medications    hydrochlorothiazide  25 MG tablet Commonly known as: HYDRODIURIL    losartan  50 MG tablet Commonly known as: COZAAR        TAKE these medications  ARIPiprazole  15 MG tablet Commonly known as: ABILIFY  Take 15 mg by mouth every evening.   aspirin  EC 81 MG tablet Take 81 mg by mouth at bedtime. Swallow whole.   atorvastatin  20 MG tablet Commonly known as: LIPITOR TAKE ONE TABLET BY MOUTH EVERY DAY What changed: when to take this   celecoxib  200 MG capsule Commonly known as: CELEBREX  Take 1 capsule (200 mg total) by mouth 2 (two)  times daily.   clindamycin  300 MG capsule Commonly known as: CLEOCIN  Take 1 capsule by mouth every 12 (twelve) hours.   donepezil  10 MG tablet Commonly known as: ARICEPT  Take 1 tablet (10 mg total) by mouth at bedtime.   DULoxetine  60 MG capsule Commonly known as: CYMBALTA  Take 60 mg by mouth 2 (two) times daily.   emtricitabine-tenofovir 200-300 MG tablet Commonly known as: TRUVADA Take 1 tablet by mouth daily.   famotidine  40 MG tablet Commonly known as: PEPCID  Take 40 mg by mouth at bedtime.   Fish Oil 1000 MG Caps Take 2,000 mg by mouth daily.   fluticasone  50 MCG/ACT nasal spray Commonly known as: FLONASE  instill TWO SPRAYS IN EACH NOSTRIL EVERY night   gabapentin  600 MG tablet Commonly known as: NEURONTIN  Take 1,200 mg by mouth 3 (three) times daily.   methocarbamol  500 MG tablet Commonly known as: ROBAXIN  Take 500 mg by mouth every 8 (eight) hours.   Mounjaro 10 MG/0.5ML Pen Generic drug: tirzepatide SMARTSIG:10 Milligram(s) SUB-Q Once a Week   mupirocin  ointment 2 % Commonly known as: BACTROBAN  Apply thin amount to wound and cover with light dressing or Bandaid. Change daily   naloxone 4 MG/0.1ML Liqd nasal spray kit Commonly known as: NARCAN SMARTSIG:Both Nares   oxyCODONE -acetaminophen  10-325 MG tablet Commonly known as: PERCOCET Take 1 tablet by mouth 4 (four) times daily.   pantoprazole  40 MG tablet Commonly known as: PROTONIX  Take 40 mg by mouth 3 (three) times daily.   prasugrel  5 MG Tabs tablet Commonly known as: EFFIENT  Take 1 tablet (5 mg total) by mouth daily.   predniSONE  20 MG tablet Commonly known as: DELTASONE  Take 20 mg by mouth daily.   sildenafil  50 MG tablet Commonly known as: Viagra  Take 1 tablet (50 mg total) by mouth daily as needed for erectile dysfunction.   silver  sulfADIAZINE  1 % cream Commonly known as: Silvadene  Apply 1 Application topically daily. What changed:  when to take this reasons to take this    tamsulosin  0.4 MG Caps capsule Commonly known as: FLOMAX  Take 0.4 mg by mouth 2 (two) times daily.   Testosterone  1.62 % Gel place two pumps onto THE SKIN EVERY DAY   traZODone  150 MG tablet Commonly known as: DESYREL  Take 1 tablet (150 mg total) by mouth at bedtime.   Zenpep  40000-126000 units Cpep Generic drug: Pancrelipase  (Lip-Prot-Amyl) Take 3 capsules with meals and 2 capsule with each snack (2-3 snacks/day) What changed: additional instructions   zolpidem  10 MG tablet Commonly known as: AMBIEN  Take 10 mg by mouth at bedtime.        Day of Discharge BP (!) 147/70 (BP Location: Left Arm)   Pulse (!) 50   Temp 97.9 F (36.6 C)   Resp 18   Ht 6' (1.829 m)   Wt 88.5 kg   SpO2 99%   BMI 26.45 kg/m   Physical Exam: General: No acute respiratory distress Lungs: Clear to auscultation bilaterally without wheezes or crackles Cardiovascular: Regular rate and rhythm without murmur gallop or rub normal S1 and  S2 Abdomen: Nontender, nondistended, soft, bowel sounds positive, no rebound, no ascites, no appreciable mass Extremities: No significant cyanosis, clubbing, or edema lower extremities  Basic Metabolic Panel: Recent Labs  Lab 12/20/23 1553 12/20/23 1624 12/20/23 1820 12/21/23 0400  NA 135 135  --  135  K 3.1* 3.5  --  4.1  CL 99 99  --  102  CO2 23  --   --  22  GLUCOSE 175* 170*  --  151*  BUN 30* 37*  --  30*  CREATININE 2.34* 2.30*  --  1.74*  CALCIUM  8.1*  --   --  8.1*  MG  --   --  2.0  --     CBC: Recent Labs  Lab 12/20/23 1553 12/20/23 1624 12/21/23 0400  WBC 4.7  --  4.7  NEUTROABS 3.9  --   --   HGB 11.0* 11.2* 10.0*  HCT 34.1* 33.0* 31.9*  MCV 93.2  --  95.5  PLT 212  --  175     Time spent in discharge (includes decision making & examination of pt): 35 minutes  12/21/2023, 3:24 PM   Reyes IVAR Moores, MD [facility] Office  [phone]

## 2023-12-21 NOTE — Evaluation (Signed)
 Occupational Therapy Evaluation Patient Details Name: Travis [name] Sr. MRN: [id number] DOB: [birth year] Today's Date: 12/21/2023   History of Present Illness   73 y.o. male admitted 12/20/23 with weakness, dizziness leading to fall. Workup for hypotension, AKI. PMH includes R BKA with chronic knee ulcer, CAD, DM2, HTN, chronic pain, depression/anxiety, bilateral THA, bilateral TKA, L reverse TSA, lumbar fusion, cervical fusion, scoliosis.     Clinical Impressions Pt admitted for above, PTA pt lived alone and reports being ind/mod I for ADLs and mobility using rollator, has R prosthetic. Pt currently presenting with mild balance deficits, no over LOB noted, he demonstrated capacity to complete ADLs with min A to setup A and was able to ambulate around room with CGA + RW. Pt not far from functional baseline, no post acute OT needs identified as he would likely be able to dress self in sitting ind instead of min A. OT to continue following pt acutely to ensure safe/smooth transition to mod I level prior to home.     If plan is discharge home, recommend the following:   Assistance with cooking/housework     Functional Status Assessment   Patient has had a recent decline in their functional status and demonstrates the ability to make significant improvements in function in a reasonable and predictable amount of time.     Equipment Recommendations   None recommended by OT     Recommendations for Other Services         Precautions/Restrictions   Precautions Precautions: Fall;Other (comment) Recall of Precautions/Restrictions: Intact Precaution/Restrictions Comments: h/o R BKA (has prosthetic LE in room) Restrictions Weight Bearing Restrictions Per Provider Order: No     Mobility Bed Mobility               General bed mobility comments: pt in recliner on arrival    Transfers Overall transfer level: Needs assistance Equipment used: Rolling walker (2  wheels) Transfers: Sit to/from Stand Sit to Stand: Contact guard assist                  Balance Overall balance assessment: Needs assistance   Sitting balance-Leahy Scale: Good     Standing balance support: Bilateral upper extremity supported, Single extremity supported, During functional activity, No upper extremity supported Standing balance-Leahy Scale: Fair Standing balance comment: can static stand without UE support                           ADL either performed or assessed with clinical judgement   ADL Overall ADL's : Needs assistance/impaired Eating/Feeding: Sitting;Independent   Grooming: Standing;Wash/dry hands;Contact guard assist   Upper Body Bathing: Standing;Supervision/ safety Upper Body Bathing Details (indicate cue type and reason): simulated Lower Body Bathing: Contact guard assist;Sit to/from stand Lower Body Bathing Details (indicate cue type and reason): simulated with bed rail as grab bar Upper Body Dressing : Minimal assistance;Standing Upper Body Dressing Details (indicate cue type and reason): to get shirt over head, pt needing UE support to balance. Lower Body Dressing: Sitting/lateral leans;Set up Lower Body Dressing Details (indicate cue type and reason): dons like slip ons Toilet Transfer: Contact guard assist;Rolling walker (2 wheels);Ambulation   Toileting- Clothing Manipulation and Hygiene: Contact guard assist;Sit to/from stand       Functional mobility during ADLs: Contact guard assist;Rolling walker (2 wheels)       Vision Baseline Vision/History: 1 Wears glasses Vision Assessment?: No apparent visual deficits     Perception  Praxis         Pertinent Vitals/Pain Pain Assessment Pain Assessment: No/denies pain     Extremity/Trunk Assessment Upper Extremity Assessment Upper Extremity Assessment: Generalized weakness;RUE deficits/detail RUE Deficits / Details: h/o L reverse TSA; reports needing R  shoulder sx but waiting on R knee wound to heal 4/5 strength throughout   Lower Extremity Assessment Lower Extremity Assessment: Generalized weakness;RLE deficits/detail;LLE deficits/detail RLE Deficits / Details: h/o R BKA, chronic R knee wound that has been open the past few months (currently taking antibiotics per pt) LLE Deficits / Details: functional strength >3/5; noted scrapes and skin changes lower leg       Communication Communication Communication: No apparent difficulties   Cognition Arousal: Alert Behavior During Therapy: WFL for tasks assessed/performed Cognition: No apparent impairments                               Following commands: Intact       Cueing  General Comments   Cueing Techniques: Verbal cues  VSS   Exercises     Shoulder Instructions      Home Living Family/patient expects to be discharged to:: Private residence Living Arrangements: Alone Available Help at Discharge: Family;Friend(s);Available PRN/intermittently Type of Home: House Home Access: Ramped entrance     Home Layout: One level     Bathroom Shower/Tub: Producer, television/film/video: Handicapped height Bathroom Accessibility: Yes   Home Equipment: Agricultural consultant (2 wheels);Rollator (4 wheels);Cane - single point;Shower seat;Grab bars - toilet;Grab bars - tub/shower   Additional Comments: sleeps in recliner      Prior Functioning/Environment Prior Level of Function : Independent/Modified Independent;Working/employed             Mobility Comments: mod indep with rollator or SPC (depends on how I'm feeling), works, uses Sales executive for transportation to work, Chartered certified accountant for [employer], has groceries delivered ADLs Comments: mod indep with ADL/iADLs    OT Problem List: Impaired balance (sitting and/or standing);Decreased strength   OT Treatment/Interventions: Self-care/ADL training;Therapeutic exercise;Patient/family education;Balance  training;Therapeutic activities;DME and/or AE instruction      OT Goals(Current goals can be found in the care plan section)   Acute Rehab OT Goals Patient Stated Goal: go home OT Goal Formulation: With patient Time For Goal Achievement: 01/04/24 Potential to Achieve Goals: Good ADL Goals Pt Will Perform Grooming: with modified independence;standing Pt Will Perform Lower Body Dressing: with modified independence;sit to/from stand Pt Will Transfer to Toilet: with modified independence;ambulating Pt Will Perform Tub/Shower Transfer: Tub transfer;ambulating;with modified independence   OT Frequency:  Min 1X/week    Co-evaluation              AM-PAC OT 6 Clicks Daily Activity     Outcome Measure Help from another person eating meals?: None Help from another person taking care of personal grooming?: A Little Help from another person toileting, which includes using toliet, bedpan, or urinal?: A Little Help from another person bathing (including washing, rinsing, drying)?: A Little Help from another person to put on and taking off regular upper body clothing?: A Little Help from another person to put on and taking off regular lower body clothing?: A Little 6 Click Score: 19   End of Session Equipment Utilized During Treatment: Rolling walker (2 wheels) Nurse Communication: Mobility status  Activity Tolerance: Patient tolerated treatment well Patient left: in chair;with call bell/phone within reach;with chair alarm set  OT Visit Diagnosis: Unsteadiness on feet (  R26.81);Muscle weakness (generalized) (M62.81)                Time: 8596-8579 OT Time Calculation (min): 17 min Charges:  OT General Charges $OT Visit: 1 Visit OT Evaluation $OT Eval Low Complexity: 1 Low  12/21/2023  AB, OTR/L  Acute Rehabilitation Services  Office: [phone]   Curtistine JONETTA Das 12/21/2023, 4:03 PM

## 2023-12-21 NOTE — Progress Notes (Signed)
 DISCHARGE NOTE HOME Travis [name] Sr. to be discharged Home per MD order. Discussed prescriptions and follow up appointments with the patient. Prescriptions given to patient; medication list explained in detail. Patient verbalized understanding.  Skin clean, dry and intact without evidence of skin break down, no evidence of skin tears noted. IV catheter discontinued intact. Site without signs and symptoms of complications. Dressing and pressure applied. Pt denies pain at the site currently. No complaints noted.  Right BKA Patient free of lines, drains, and wounds.   An After Visit Summary (AVS) was printed and given to the patient. Patient escorted via wheelchair, and discharged home via private auto.  Peyton SHAUNNA Pepper, RN

## 2023-12-21 NOTE — Evaluation (Signed)
 Physical Therapy Evaluation Patient Details Name: Travis DAVE Sr. MRN: [id number] DOB: 1950/10/25 Today's Date: 12/21/2023  History of Present Illness  73 y.o. male admitted 12/20/23 with weakness, dizziness leading to fall. Workup for hypotension, AKI. PMH includes R BKA with chronic knee ulcer, CAD, DM2, HTN, chronic pain, depression/anxiety, bilateral THA, bilateral TKA, L reverse TSA, lumbar fusion, cervical fusion, scoliosis.   Clinical Impression  Pt presents with an overall decrease in functional mobility secondary to above. PTA, pt mod indep with rollator or SPC, lives alone, works, does not drive. Today, pt able to transfer and ambulate with RW and intermittent CGA for balance; pt denies dizziness with activity, feels much better compared to yesterday. Pt would benefit from continued acute PT services to maximize functional mobility and independence prior to d/c home.     Orthostatic BPs Sitting 126/87  Standing 136/91  Post-ambulation 127/70       If plan is discharge home, recommend the following: Assistance with cooking/housework;Assist for transportation   Can travel by private vehicle    Yes    Equipment Recommendations None recommended by PT  Recommendations for Other Services   Mobility Specialist    Functional Status Assessment Patient has had a recent decline in their functional status and demonstrates the ability to make significant improvements in function in a reasonable and predictable amount of time.     Precautions / Restrictions Precautions Precautions: Fall;Other (comment) Recall of Precautions/Restrictions: Intact Precaution/Restrictions Comments: h/o R BKA (has prosthetic LE in room) Restrictions Weight Bearing Restrictions Per Provider Order: No      Mobility  Bed Mobility Overal bed mobility: Needs Assistance Bed Mobility: Supine to Sit     Supine to sit: Min assist     General bed mobility comments: minA for HHA to elevate trunk     Transfers Overall transfer level: Needs assistance Equipment used: Rolling walker (2 wheels) Transfers: Sit to/from Stand, Bed to chair/wheelchair/BSC Sit to Stand: Contact guard assist   Step pivot transfers: Contact guard assist       General transfer comment: multiple sit<>stands from EOB and recliner to RW with CGA; increased difficulty standing from low recliner height requiring increased time and cues for BUE/BLE placement. initial step pivot from bed>recliner without DME, CGA for balance/safety    Ambulation/Gait Ambulation/Gait assistance: Contact guard assist Gait Distance (Feet): 60 Feet Assistive device: Rolling walker (2 wheels) Gait Pattern/deviations: Step-to pattern, Step-through pattern, Decreased stride length, Trunk flexed Gait velocity: Decreased     General Gait Details: slow, mildly unsteady gait with RW and CGA for balance  Stairs            Wheelchair Mobility     Tilt Bed    Modified Rankin (Stroke Patients Only)       Balance Overall balance assessment: Needs assistance   Sitting balance-Leahy Scale: Good Sitting balance - Comments: able to don L shoe and RLE prosthetic sitting EOB   Standing balance support: Bilateral upper extremity supported, Single extremity supported, During functional activity, No upper extremity supported Standing balance-Leahy Scale: Fair Standing balance comment: can static stand without UE support                             Pertinent Vitals/Pain Pain Assessment Pain Assessment: Faces Faces Pain Scale: No hurt Pain Intervention(s): Monitored during session    Home Living Family/patient expects to be discharged to:: Private residence Living Arrangements: Alone Available Help at Discharge: Family;Friend(s);Available  PRN/intermittently Type of Home: House Home Access: Ramped entrance       Home Layout: One level Home Equipment: Agricultural consultant (2 wheels);Rollator (4 wheels);Cane - single  point;Shower seat;Grab bars - toilet;Grab bars - tub/shower Additional Comments: sleeps in recliner    Prior Function Prior Level of Function : Independent/Modified Independent;Working/employed             Mobility Comments: mod indep with rollator or SPC (depends on how I'm feeling), works, uses Sales executive for transportation to work, Chartered certified accountant for [employer], has groceries delivered ADLs Comments: mod indep with ADL/iADLs     Extremity/Trunk Assessment   Upper Extremity Assessment Upper Extremity Assessment: Generalized weakness;RUE deficits/detail (h/o L reverse TSA; reports needing R shoulder sx but waiting on R knee wound to heal)    Lower Extremity Assessment Lower Extremity Assessment: Generalized weakness;RLE deficits/detail;LLE deficits/detail RLE Deficits / Details: h/o R BKA, chronic R knee wound that has been open the past few months (currently taking antibiotics per pt) LLE Deficits / Details: functional strength >3/5; noted scrapes and skin changes lower leg       Communication   Communication Communication: No apparent difficulties    Cognition Arousal: Alert Behavior During Therapy: WFL for tasks assessed/performed   PT - Cognitive impairments: No apparent impairments                         Following commands: Intact       Cueing       General Comments General comments (skin integrity, edema, etc.): sitting BP 126/87, standing BP 136/91, post-ambulation BP 127/70. educ re: role of acute PT, activity recommendations, fall risk reduction, d/c needs    Exercises     Assessment/Plan    PT Assessment Patient needs continued PT services  PT Problem List Decreased strength;Decreased activity tolerance;Decreased balance;Decreased mobility;Cardiopulmonary status limiting activity       PT Treatment Interventions DME instruction;Gait training;Functional mobility training;Therapeutic activities;Therapeutic exercise;Balance  training;Patient/family education    PT Goals (Current goals can be found in the Care Plan section)  Acute Rehab PT Goals Patient Stated Goal: return home PT Goal Formulation: With patient Time For Goal Achievement: 01/04/24 Potential to Achieve Goals: Good    Frequency Min 1X/week     Co-evaluation               AM-PAC PT 6 Clicks Mobility  Outcome Measure Help needed turning from your back to your side while in a flat bed without using bedrails?: None Help needed moving from lying on your back to sitting on the side of a flat bed without using bedrails?: A Little Help needed moving to and from a bed to a chair (including a wheelchair)?: A Little Help needed standing up from a chair using your arms (e.g., wheelchair or bedside chair)?: A Little Help needed to walk in hospital room?: A Little Help needed climbing 3-5 steps with a railing? : A Lot 6 Click Score: 18    End of Session   Activity Tolerance: Patient tolerated treatment well Patient left: in chair;with call bell/phone within reach;with chair alarm set Nurse Communication: Mobility status PT Visit Diagnosis: Other abnormalities of gait and mobility (R26.89);Muscle weakness (generalized) (M62.81)    Time: 1330-1402 PT Time Calculation (min) (ACUTE ONLY): 32 min   Charges:   PT Evaluation $PT Eval Moderate Complexity: 1 Mod PT Treatments $Therapeutic Activity: 8-22 mins PT General Charges $$ ACUTE PT VISIT: 1 Visit  Prarthana Parlin, PT, DPT Acute Rehabilitation Services  Personal: Secure Chat Rehab Office: [phone]  Darice LITTIE Almas 12/21/2023, 2:43 PM

## 2023-12-21 NOTE — Progress Notes (Signed)
 Transition of Care [facility]) - Inpatient Brief Assessment   Patient Details  Name: Travis RING Sr. MRN: [id number] Date of Birth: November 28, 1950  Transition of Care [facility]) CM/SW Contact:    Rosaline JONELLE Joe, RN Phone Number: 12/21/2023, 3:42 PM   Clinical Narrative: Patient admitted from home with hypotension.  Patient is normally independent and has DME at home including prosthesis SPC, RW, Rolator.  No IP Care management needs at this time.  Patient is scheduled to discharge home today.   Transition of Care Asessment: Insurance and Status: (P) Insurance coverage has been reviewed Patient has primary care physician: (P) Yes Home environment has been reviewed: (P) from home Prior level of function:: (P) Independent, SPC Prior/Current Home Services: (P) Current home services (Active with [facility]) Social Drivers of Health Review: (P) SDOH reviewed no interventions necessary Readmission risk has been reviewed: (P) Yes Transition of care needs: (P) no transition of care needs at this time

## 2023-12-21 NOTE — ED Notes (Signed)
 Got patient temp and gave patient a urinal patient has call bell in reach got patient a warm blanket

## 2023-12-21 NOTE — ED Notes (Signed)
 Tried calling 2W x 2 to notify them of patient coming up, no answer either time.

## 2023-12-21 NOTE — Plan of Care (Signed)
  Problem: Coping: Goal: Ability to adjust to condition or change in health will improve Outcome: Progressing   Problem: Education: Goal: Ability to describe self-care measures that may prevent or decrease complications (Diabetes Survival Skills Education) will improve Outcome: Progressing Goal: Individualized Educational Video(s) Outcome: Progressing   Problem: Fluid Volume: Goal: Ability to maintain a balanced intake and output will improve Outcome: Progressing   Problem: Health Behavior/Discharge Planning: Goal: Ability to identify and utilize available resources and services will improve Outcome: Progressing Goal: Ability to manage health-related needs will improve Outcome: Progressing

## 2023-12-25 ENCOUNTER — Other Ambulatory Visit: Payer: Self-pay

## 2023-12-25 DIAGNOSIS — M01X61 Direct infection of right knee in infectious and parasitic diseases classified elsewhere: Secondary | ICD-10-CM

## 2023-12-26 ENCOUNTER — Other Ambulatory Visit: Payer: Self-pay

## 2023-12-26 DIAGNOSIS — M01X61 Direct infection of right knee in infectious and parasitic diseases classified elsewhere: Secondary | ICD-10-CM

## 2023-12-27 ENCOUNTER — Ambulatory Visit: Admitting: Orthopedic Surgery

## 2023-12-27 DIAGNOSIS — Z89511 Acquired absence of right leg below knee: Secondary | ICD-10-CM

## 2023-12-27 DIAGNOSIS — S81001A Unspecified open wound, right knee, initial encounter: Secondary | ICD-10-CM
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
## 2023-12-28 ENCOUNTER — Inpatient Hospital Stay: Admission: RE | Admit: 2023-12-28 | Source: Ambulatory Visit

## 2023-12-29 ENCOUNTER — Emergency Department (HOSPITAL_COMMUNITY)

## 2023-12-29 ENCOUNTER — Other Ambulatory Visit: Payer: Self-pay

## 2023-12-29 ENCOUNTER — Emergency Department (HOSPITAL_COMMUNITY)
Admission: EM | Admit: 2023-12-29 | Discharge: 2023-12-30 | Disposition: A | Discharge status: Home / Self Care | Attending: Emergency Medicine | Admitting: Emergency Medicine

## 2023-12-29 ENCOUNTER — Encounter (HOSPITAL_COMMUNITY): Payer: Self-pay

## 2023-12-29 DIAGNOSIS — E119 Type 2 diabetes mellitus without complications: Secondary | ICD-10-CM | POA: Diagnosis not present

## 2023-12-29 DIAGNOSIS — W19XXXA Unspecified fall, initial encounter: Secondary | ICD-10-CM | POA: Diagnosis not present

## 2023-12-29 DIAGNOSIS — M4800 Spinal stenosis, site unspecified: Secondary | ICD-10-CM

## 2023-12-29 DIAGNOSIS — R079 Chest pain, unspecified: Secondary | ICD-10-CM | POA: Diagnosis not present

## 2023-12-29 DIAGNOSIS — M48061 Spinal stenosis, lumbar region without neurogenic claudication: Secondary | ICD-10-CM | POA: Insufficient documentation

## 2023-12-29 DIAGNOSIS — M545 Low back pain, unspecified: Secondary | ICD-10-CM

## 2023-12-29 DIAGNOSIS — M549 Dorsalgia, unspecified: Secondary | ICD-10-CM | POA: Diagnosis present

## 2023-12-29 DIAGNOSIS — I2510 Atherosclerotic heart disease of native coronary artery without angina pectoris: Secondary | ICD-10-CM | POA: Insufficient documentation

## 2023-12-29 DIAGNOSIS — I10 Essential (primary) hypertension: Secondary | ICD-10-CM | POA: Diagnosis not present

## 2023-12-29 DIAGNOSIS — Z7982 Long term (current) use of aspirin: Secondary | ICD-10-CM | POA: Diagnosis not present

## 2023-12-29 LAB — CBC WITH DIFFERENTIAL/PLATELET
Abs Immature Granulocytes: 0 K/uL (ref 0.00–0.07)
Basophils Absolute: 0 K/uL (ref 0.0–0.1)
Basophils Relative: 1 %
Eosinophils Absolute: 0.1 K/uL (ref 0.0–0.5)
Eosinophils Relative: 3 %
HCT: 33.4 % — ABNORMAL LOW (ref 39.0–52.0)
Hemoglobin: 11 g/dL — ABNORMAL LOW (ref 13.0–17.0)
Immature Granulocytes: 0 %
Lymphocytes Relative: 49 %
Lymphs Abs: 1.8 K/uL (ref 0.7–4.0)
MCH: 30.4 pg (ref 26.0–34.0)
MCHC: 32.9 g/dL (ref 30.0–36.0)
MCV: 92.3 fL (ref 80.0–100.0)
Monocytes Absolute: 0.3 K/uL (ref 0.1–1.0)
Monocytes Relative: 8 %
Neutro Abs: 1.4 K/uL — ABNORMAL LOW (ref 1.7–7.7)
Neutrophils Relative %: 39 %
Platelets: 224 K/uL (ref 150–400)
RBC: 3.62 MIL/uL — ABNORMAL LOW (ref 4.22–5.81)
RDW: 13.3 % (ref 11.5–15.5)
WBC: 3.6 K/uL — ABNORMAL LOW (ref 4.0–10.5)
nRBC: 0 % (ref 0.0–0.2)

## 2023-12-29 LAB — COMPREHENSIVE METABOLIC PANEL WITH GFR
ALT: 11 U/L (ref 0–44)
AST: 24 U/L (ref 15–41)
Albumin: 3.1 g/dL — ABNORMAL LOW (ref 3.5–5.0)
Alkaline Phosphatase: 57 U/L (ref 38–126)
Anion gap: 13 (ref 5–15)
BUN: 17 mg/dL (ref 8–23)
CO2: 25 mmol/L (ref 22–32)
Calcium: 8.7 mg/dL — ABNORMAL LOW (ref 8.9–10.3)
Chloride: 100 mmol/L (ref 98–111)
Creatinine, Ser: 1.18 mg/dL (ref 0.61–1.24)
GFR, Estimated: >60 mL/min
Glucose, Bld: 92 mg/dL (ref 70–99)
Potassium: 4.3 mmol/L (ref 3.5–5.1)
Sodium: 138 mmol/L (ref 135–145)
Total Bilirubin: 1.4 mg/dL — ABNORMAL HIGH (ref 0.0–1.2)
Total Protein: 5.5 g/dL — ABNORMAL LOW (ref 6.5–8.1)

## 2023-12-29 LAB — TROPONIN I (HIGH SENSITIVITY)
Troponin I (High Sensitivity): 12 ng/L (ref <18)
Troponin I (High Sensitivity): 13 ng/L (ref <18)

## 2023-12-29 MED ORDER — OXYCODONE-ACETAMINOPHEN 5-325 MG PO TABS
1.0000 | ORAL_TABLET | Freq: Three times a day (TID) | ORAL | Status: DC
  Administered 2023-12-30 (×3): 1 via ORAL
  Filled 2023-12-29 (×3): qty 1

## 2023-12-29 MED ORDER — OXYCODONE-ACETAMINOPHEN 10-325 MG PO TABS: 1.0000 | ORAL_TABLET | Freq: Four times a day (QID) | ORAL | Status: DC

## 2023-12-29 MED ORDER — ONDANSETRON HCL 4 MG/2ML IJ SOLN
4.0000 mg | Freq: Once | INTRAMUSCULAR | Status: AC
  Administered 2023-12-29: 4 mg via INTRAVENOUS
  Filled 2023-12-29: qty 2

## 2023-12-29 MED ORDER — DIAZEPAM 5 MG/ML IJ SOLN
2.5000 mg | Freq: Once | INTRAMUSCULAR | Status: AC
  Administered 2023-12-29: 2.5 mg via INTRAVENOUS
  Filled 2023-12-29: qty 2

## 2023-12-29 MED ORDER — EMTRICITABINE-TENOFOVIR AF 200-25 MG PO TABS
1.0000 | ORAL_TABLET | Freq: Every day | ORAL | Status: DC
  Filled 2023-12-29 (×2): qty 1

## 2023-12-29 MED ORDER — OXYCODONE HCL 5 MG PO TABS
5.0000 mg | ORAL_TABLET | Freq: Three times a day (TID) | ORAL | Status: DC
  Administered 2023-12-30 (×3): 5 mg via ORAL
  Filled 2023-12-29 (×3): qty 1

## 2023-12-29 MED ORDER — GABAPENTIN 400 MG PO CAPS
1200.0000 mg | ORAL_CAPSULE | Freq: Three times a day (TID) | ORAL | Status: DC
  Administered 2023-12-30 (×2): 1200 mg via ORAL
  Filled 2023-12-29 (×2): qty 3

## 2023-12-29 MED ORDER — TRAZODONE HCL 50 MG PO TABS
150.0000 mg | ORAL_TABLET | Freq: Every day | ORAL | Status: DC
  Administered 2023-12-30: 150 mg via ORAL
  Filled 2023-12-29: qty 3

## 2023-12-29 MED ORDER — METHOCARBAMOL 500 MG PO TABS
500.0000 mg | ORAL_TABLET | Freq: Three times a day (TID) | ORAL | Status: DC
  Administered 2023-12-30 (×2): 500 mg via ORAL
  Filled 2023-12-29 (×2): qty 1

## 2023-12-29 MED ORDER — DONEPEZIL HCL 5 MG PO TABS
10.0000 mg | ORAL_TABLET | Freq: Every day | ORAL | Status: DC
  Administered 2023-12-30: 10 mg via ORAL
  Filled 2023-12-29: qty 1

## 2023-12-29 MED ORDER — HYDROMORPHONE HCL 1 MG/ML IJ SOLN
1.0000 mg | Freq: Once | INTRAMUSCULAR | Status: AC
  Administered 2023-12-29: 1 mg via INTRAVENOUS
  Filled 2023-12-29: qty 1
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
## 2023-12-29 NOTE — Discharge Instructions (Signed)
 Your CT scan showed spinal stenosis.  Please continue your pain medicine as prescribed by your doctor  Follow-up with your spine doctor and your cardiologist  As we discussed your workup is normal today  You were evaluated by our case management physical therapy team and we have made arrangements to have a wheelchair and home health services in place for you  Return to ER if you have worse back pain or chest pain

## 2023-12-29 NOTE — ED Notes (Signed)
 Assumed care of pt at this time. Report received from [place], [place]

## 2023-12-29 NOTE — ED Provider Notes (Addendum)
 [place] EMERGENCY DEPARTMENT AT [facility] Provider Note   CSN: [id number] Arrival date & time: 12/29/23  1946     Patient presents with: Fall and Chest Pain   Travis [name]. is a 73 y.o. male history of CAD, diabetes, hypertension, hyperlipidemia, here presenting with fall.  Patient had a fall yesterday   and landed on his buttock and back.  EMS was called and vitals were stable so he was not transported to the ER.  He states that he has worsening back pain this morning.  Patient also had some chest pain this morning and called EMS.  Patient was recently admitted to the hospital for elevated troponin and AKI.  Patient is on chronic pain medicine   The history is provided by the patient.       Prior to Admission medications   Medication Sig Start Date End Date Taking? Authorizing Provider  ARIPiprazole  (ABILIFY ) 15 MG tablet Take 15 mg by mouth every evening. 08/02/23   [provider]  aspirin  EC 81 MG tablet Take 81 mg by mouth at bedtime. Swallow whole.    [provider]  atorvastatin  (LIPITOR) 20 MG tablet TAKE ONE TABLET BY MOUTH EVERY DAY Patient taking differently: Take 20 mg by mouth every evening. 03/08/23   Verlin Lonni BIRCH, MD  celecoxib  (CELEBREX ) 200 MG capsule Take 1 capsule (200 mg total) by mouth 2 (two) times daily. 07/04/21   Nitka, James E, MD  donepezil  (ARICEPT ) 10 MG tablet Take 1 tablet (10 mg total) by mouth at bedtime. 08/07/22   Norleen Lynwood ORN, MD  DULoxetine  (CYMBALTA ) 60 MG capsule Take 60 mg by mouth 2 (two) times daily. 08/02/23   [provider]  emtricitabine -tenofovir  (TRUVADA) 200-300 MG tablet Take 1 tablet by mouth daily. 08/06/23   [provider]  famotidine  (PEPCID ) 40 MG tablet Take 40 mg by mouth at bedtime. 05/10/23   [provider]  fluticasone  (FLONASE ) 50 MCG/ACT nasal spray instill TWO SPRAYS IN EACH NOSTRIL EVERY night 05/09/23   [provider]  gabapentin   (NEURONTIN ) 600 MG tablet Take 1,200 mg by mouth 3 (three) times daily.    [provider]  methocarbamol  (ROBAXIN ) 500 MG tablet Take 500 mg by mouth every 8 (eight) hours. 08/11/23   [provider]  MOUNJARO 10 MG/0.5ML Pen SMARTSIG:10 Milligram(s) SUB-Q Once a Week 04/09/23   [provider]  mupirocin  ointment (BACTROBAN ) 2 % Apply thin amount to wound and cover with light dressing or Bandaid. Change daily 07/02/23   McCaughan, Dia D, DPM  naloxone [facility]) nasal spray 4 mg/0.1 mL SMARTSIG:Both Nares 04/23/23   [provider]  Omega-3 Fatty Acids (FISH OIL) 1000 MG CAPS Take 2,000 mg by mouth daily.    [provider]  oxyCODONE -acetaminophen  (PERCOCET) 10-325 MG tablet Take 1 tablet by mouth 4 (four) times daily.    [provider]  Pancrelipase , Lip-Prot-Amyl, (ZENPEP ) 40000-126000 units CPEP Take 3 capsules with meals and 2 capsule with each snack (2-3 snacks/day) Patient taking differently: Take 4 capsules with meals and 2 capsule with each snack (2-3 snacks/day) 11/06/23   Armbruster, Elspeth SQUIBB, MD  pantoprazole  (PROTONIX ) 40 MG tablet Take 40 mg by mouth 3 (three) times daily.    [provider]  prasugrel  (EFFIENT ) 5 MG TABS tablet Take 1 tablet (5 mg total) by mouth daily. 01/30/23   Verlin Lonni BIRCH, MD  predniSONE  (DELTASONE ) 20 MG tablet Take 20 mg by mouth daily. 07/30/23  [provider]  sildenafil  (VIAGRA ) 50 MG tablet Take 1 tablet (50 mg total) by mouth daily as needed for erectile dysfunction. 01/16/23   Conte, Tessa N, PA-C  silver  sulfADIAZINE  (SILVADENE ) 1 % cream Apply 1 Application topically daily. Patient taking differently: Apply 1 Application topically daily as needed (for burn,scars). 01/26/23   Harden Jerona GAILS, MD  tamsulosin  (FLOMAX ) 0.4 MG CAPS capsule Take 0.4 mg by mouth 2 (two) times daily. 06/30/20   [provider]  Testosterone  1.62 % GEL place two pumps onto THE SKIN EVERY DAY  05/08/22   Norleen Lynwood ORN, MD  traZODone  (DESYREL ) 150 MG tablet Take 1 tablet (150 mg total) by mouth at bedtime. 06/28/22   Arfeen, Leni DASEN, MD  zolpidem  (AMBIEN ) 10 MG tablet Take 10 mg by mouth at bedtime. 07/24/23   [provider]    Allergies: Buprenorphine  and Neosporin [bacitracin -polymyxin b]    Review of Systems  Cardiovascular:  Positive for chest pain.  Musculoskeletal:  Positive for back pain.  All other systems reviewed and are negative.   Updated Vital Signs BP 106/63   Resp 14   SpO2 100%   Physical Exam Vitals and nursing note reviewed.  Constitutional:      Appearance: He is well-developed.  HENT:     Head: Normocephalic.  Eyes:     Extraocular Movements: Extraocular movements intact.     Pupils: Pupils are equal, round, and reactive to light.  Cardiovascular:     Rate and Rhythm: Normal rate and regular rhythm.     Heart sounds: Normal heart sounds.  Pulmonary:     Effort: Pulmonary effort is normal.     Breath sounds: Normal breath sounds.  Abdominal:     General: Bowel sounds are normal.     Palpations: Abdomen is soft.  Musculoskeletal:     Cervical back: Normal range of motion and neck supple.     Comments: Mild lower lumbar tenderness.  Patient has right BKA  Skin:    General: Skin is warm.     Capillary Refill: Capillary refill takes less than 2 seconds.  Neurological:     General: No focal deficit present.     Mental Status: He is alert and oriented to person, place, and time.  Psychiatric:        Mood and Affect: Mood normal.        Behavior: Behavior normal.     (all labs ordered are listed, but only abnormal results are displayed) Labs Reviewed  CBC WITH DIFFERENTIAL/PLATELET - Abnormal; Notable for the following components:      Result Value   WBC 3.6 (*)    RBC 3.62 (*)    Hemoglobin 11.0 (*)    HCT 33.4 (*)    Neutro Abs 1.4 (*)    All other components within normal limits  COMPREHENSIVE METABOLIC PANEL WITH GFR -  Abnormal; Notable for the following components:   Calcium  8.7 (*)    Total Protein 5.5 (*)    Albumin  3.1 (*)    Total Bilirubin 1.4 (*)    All other components within normal limits  TROPONIN I (HIGH SENSITIVITY)  TROPONIN I (HIGH SENSITIVITY)    EKG: None  Radiology: CT Lumbar Spine Wo Contrast Result Date: 12/29/2023 CLINICAL DATA:  Lumbar trauma and ataxia.  Back pain.  Fall injury. EXAM: CT LUMBAR SPINE WITHOUT CONTRAST TECHNIQUE: Multidetector CT imaging of the lumbar spine was performed without intravenous contrast administration. Multiplanar CT image reconstructions were also  generated. RADIATION DOSE REDUCTION: This exam was performed according to the departmental dose-optimization program which includes automated exposure control, adjustment of the mA and/or kV according to patient size and/or use of iterative reconstruction technique. COMPARISON:  CT abdomen pelvis without contrast and reconstructions 11/26/2021. FINDINGS: Segmentation: Standard. Alignment: Unchanged. There is a mild dextroscoliosis apex at L3. There is minimal chronic grade 1 retrolisthesis at L1-2 and L2-3. Vertebrae: Osteopenia. No focal pathologic process is seen. No evidence of fractures. No breaks are seen in the multilevel bridging osteophytes. There are bilateral posterior fusion rods and pedicle screws from L1-S1, laminectomy defects L4 and 5 and dorsal bone grafting with contiguous fusion mantle on the right from L2 through S1 and on the left from L3 through S1. Interbody ankylosis via bulky bridging osteophytes is seen from T10 through S1 and also partially across the disc spaces, with the exception of L1-2 where there is a patent disc space with vacuum phenomenon and bulky near circumferential marginal osteophytes. All of this was seen 2 years ago. There is no overt or progressive hardware loosening, with minimal stable lucency around the L1 pedicle screws. Paraspinal and other soft tissues: Aortic  atherosclerosis. Small hiatal hernia. Otherwise negative. Disc levels: Fused except at L1-2 as described above. There is no high-grade spinal canal stenosis from T10 through S1. The There is mild spinal canal stenosis at L1-2 and L2-3 due to retrolisthesis and posterior disc osteophyte complexes. This was also seen previously. The spinal canal otherwise is widely patent. Facet spurs and spondylosis are noted. Acquired foraminal stenosis is bilaterally moderate at T11-12, bilaterally moderate at L1-2, bilaterally mild at L3-4, moderate to severe on the right and mild on the left at L4-5, and mild on the right at L5-S1. There is no new or progressive foraminal stenosis since the prior CT. Other: The left SI joint is patent, with spurring change. Right SI joint is ankylosed. IMPRESSION: 1. Osteopenia, scoliosis and advanced degenerative change without evidence of fractures or interval changes. 2. Posterior fusion L1-S1 with laminectomies at L4 and 5. Contiguous dorsal bone grafting along the hardware as above. 3. Mild spinal canal stenosis at L1-2 and L2-3 due to retrolisthesis and posterior disc osteophyte complexes. Spinal canal is otherwise widely patent. 4. Multilevel foraminal stenosis. 5. Aortic atherosclerosis. 6. Small hiatal hernia. Aortic Atherosclerosis (ICD10-I70.0). Electronically Signed   By: Francis Quam M.D.   On: 12/29/2023 22:01   DG Chest Port 1 View Result Date: 12/29/2023 CLINICAL DATA:  Fall, chest in EXAM: PORTABLE CHEST 1 VIEW COMPARISON:  None Available. FINDINGS: Lungs are well expanded, symmetric, and clear. No pneumothorax or pleural effusion. Cardiac size within normal limits. Pulmonary vascularity is normal. Status post left total shoulder arthroplasty. Advanced degenerative changes noted within the right shoulder. No acute bone abnormality. IMPRESSION: No active disease. Electronically Signed   By: Dorethia Molt M.D.   On: 12/29/2023 20:49   DG Pelvis Portable Result Date:  12/29/2023 CLINICAL DATA:  Fall, pelvic pain EXAM: PORTABLE PELVIS 1-2 VIEWS COMPARISON:  None Available. FINDINGS: Bilateral total hip arthroplasty has been performed. No expected fracture or dislocation. Lumbosacral fusion with instrumentation is partially visualized. Soft tissues are unremarkable. IMPRESSION: 1. Bilateral total hip arthroplasty. No acute fracture or dislocation. Electronically Signed   By: Dorethia Molt M.D.   On: 12/29/2023 20:49     Procedures   Medications Ordered in the ED  HYDROmorphone  (DILAUDID ) injection 1 mg (1 mg Intravenous Given 12/29/23 2051)  ondansetron  (ZOFRAN ) injection 4 mg (4 mg  Intravenous Given 12/29/23 2051)                                    Medical Decision Making Travis [name]. is a 73 y.o. male who presented with chest pain and back pain.  Patient recently admitted for elevated troponin.  Patient also had a mechanical fall and hit his back yesterday.  Patient is on chronic pain medicine.  Patient has no obvious saddle anesthesia on exam.  Will do CT lumbar spine to rule out fracture.  Given that he has CAD and had elevated troponin a week ago we will get repeat troponin  11:13 PM I reviewed patient's labs and dependently interpreted imaging studies.  CT lumbar spine showed previous spinal fusion.  Patient has no obvious spinal compression.  Troponin negative x 2.  Labs unremarkable.  Pain is under control.  Patient is under pain management.  Stable for discharge  11:35 PM Patient wants to talk to social work.  Will put patient in boarding status and have social work discussed with him in the morning   Problems Addressed: Chest pain, unspecified type: acute illness or injury Midline low back pain without sciatica, unspecified chronicity: acute illness or injury Spinal stenosis, unspecified spinal region: acute illness or injury  Amount and/or Complexity of Data Reviewed Labs: ordered. Decision-making details documented in ED  Course. Radiology: ordered.  Risk Prescription drug management.     Final diagnoses:  None    ED Discharge Orders     None          Travis Alm Macho, MD 12/29/23 2314    Travis Alm Macho, MD 12/29/23 [phone]

## 2023-12-30 NOTE — ED Notes (Signed)
TOC at bedside

## 2023-12-30 NOTE — ED Provider Notes (Signed)
 PT and case management have completed their evaluation of this patient.  He does not want rehab/skilled nursing facility placement at this time.  At home services have been arranged for him by our case management team.  He will also be provided with a lightweight wheelchair and will follow-up with outpatient services.   Pamella Ozell LABOR, DO 12/30/23 1326

## 2023-12-30 NOTE — Care Management (Cosign Needed)
    Durable Medical Equipment  (From admission, onward)           Start     Ordered   12/30/23 0954  For home use only DME lightweight manual wheelchair with seat cushion  Once       Comments: Patient suffers from amputation, weakness  which impairs their ability to perform daily activities like toileting in the home.  A walker will not resolve  issue with performing activities of daily living. A wheelchair will allow patient to safely perform daily activities. Patient is not able to propel themselves in the home using a standard weight wheelchair due to general weakness. Patient can self propel in the lightweight wheelchair. Length of need Lifetime. Accessories: elevating leg rests (ELRs), wheel locks, extensions and anti-tippers.   12/30/23 [id number]

## 2023-12-30 NOTE — Care Management (Signed)
 Transition of Care [facility]) - Emergency Department Mini Assessment   Patient Details  Name: Travis SMOLINSKY Sr. MRN: [id number] Date of Birth: Jan 30, 1951  [facility]) CM/SW Contact:    Corean JAYSON Canary, RN Phone Number: 12/30/2023, 9:57 AM   Clinical Narrative: Patient presented to the ED with a fall.  He had declined ambulance at the time, but then had worsening back pain. He has a prosthesis, has been ambulating with Rolator He has a cane also, lives alone in a apartment.  Spoke to patient at bedside, introduced self, discussed recommendation of . SNF for convalescent stay for rehab. He declines this, wants to know why he can't be admitted for rehab to hospital. Discussed that he does not currently have anything to be admitted for. Discussed home health, he is accepting of this, wants [facility]. Called Katina from [facility] for PT, OT, Aide and social work, he was accepted with start of service on Tuesday.  He is requesting a wheelchair from [employer]. This will be sent to his home.   He will need transportation home, discussed with nursing and providers.   ED Mini Assessment:       Barrier interventions: HOme health, transportation, DME  Means of departure: Car  Interventions which prevented an admission or readmission: Home Health Consult or Services    Patient Contact and Communications        ,                 Admission diagnosis:  CP; FOT Patient Active Problem List   Diagnosis Date Noted   Hypotension 12/20/2023   Failed back syndrome of lumbar spine 08/04/2022   Arthropathy of both sacroiliac joints 04/06/2022   Colitis due to enteroinvasive Escherichia coli 11/28/2021   Hypophosphatemia 11/27/2021   BPH (benign prostatic hyperplasia) 11/26/2021   Pancolitis (HCC) 11/26/2021   Pancreatic insufficiency 11/26/2021   Sepsis (HCC) 11/25/2021   Chronic sinusitis 11/23/2021   Hypertension associated with type 2 diabetes mellitus (HCC) 10/25/2021    Foot callus 10/06/2021   Folliculitis 10/06/2021   Central sleep apnea comorbid with prescribed opioid use 08/17/2021   Obstructive sleep apnea on CPAP 08/17/2021   Chronic intermittent hypoxia with obstructive sleep apnea 08/17/2021   Subjective memory complaints 08/17/2021   Word finding difficulty 08/17/2021   Eating disorder 06/28/2021   Abnormal laboratory test 06/21/2021   COVID-19 virus infection 06/03/2021   Arthritis of left shoulder region    AV block, Mobitz 2 03/18/2021   S/P reverse total shoulder arthroplasty, left 03/17/2021   Iliotibial band syndrome of both sides 03/16/2021   Chronic respiratory failure with hypoxia (HCC) 02/26/2021   Dyspnea 01/14/2021   Nasal turbinate hypertrophy 11/16/2020   Sinusitis 11/02/2020   Memory loss or impairment 10/11/2020   Right arm pain 09/18/2020   Aortic atherosclerosis (HCC) 09/16/2020   Spondylolisthesis, lumbar region    Status post lumbar spinal fusion 07/27/2020   Nasal sinus polyp 06/16/2020   Sacroiliac joint pain 04/02/2020   Vitamin D  deficiency 02/24/2020   Cough 07/29/2019   Wheezing 07/29/2019   Possible exposure to STD 07/24/2019   Unilateral primary osteoarthritis, left hip 06/06/2019   Status post total replacement of left hip 06/06/2019   S/P laparoscopic sleeve gastrectomy 03/03/2019   Rash 01/24/2019   Fever 08/15/2018   UTI (urinary tract infection) 08/15/2018   Fall at home, initial encounter 08/15/2018   Normocytic anemia 08/15/2018   Hypokalemia 08/15/2018   Bowel incontinence 07/25/2018   Other spondylosis with  radiculopathy, lumbar region 07/16/2018    Class: Chronic   Status post lumbar laminectomy 07/16/2018   Status post THR (total hip replacement) 04/17/2018   Unilateral primary osteoarthritis, right hip    Myocardial infarction (HCC) 02/25/2018   Coronary artery disease 02/25/2018   PAD (peripheral artery disease) (HCC) 02/25/2018   S/P BKA (below knee amputation) unilateral, right  (HCC) 02/25/2018   Sacroiliitis (HCC) 02/25/2018   SOB (shortness of breath) 01/03/2018   Acute on chronic heart failure (HCC) 01/03/2018   Severe right groin pain 12/20/2017   Morbid obesity (HCC) 10/09/2017   Low back pain 07/13/2017   Leukoplakia, tongue 01/26/2017   Skin lesion 10/20/2016   S/P unilateral BKA (below knee amputation), right (HCC) 06/14/2016   Charcot foot due to diabetes mellitus (HCC)    Charcot's arthropathy associated with type 2 diabetes mellitus (HCC) 04/11/2016   Encounter for well adult exam with abnormal findings 11/05/2015   Major depression 09/13/2015   S/P TKR (total knee replacement) bilaterally 09/13/2015   GERD (gastroesophageal reflux disease) 09/08/2015   S/P laparoscopic hernia repair 05/11/2015   PPD positive 04/08/2015   Benign neoplasm of descending colon    Benign neoplasm of cecum    AKI (acute kidney injury) (HCC) 10/18/2014   Acute blood loss as cause of postoperative anemia    Chronic anticoagulation    Occult blood positive stool 10/17/2014   General weakness 07/14/2014   Urinary incontinence 07/14/2014   Controlled diabetes mellitus with hyperglycemia (HCC) 05/06/2014   Hypotension during surgery 01/30/2014    Class: Acute   Headache 10/15/2013   Spinal stenosis in cervical region 09/26/2013    Class: Chronic   Congenital spinal stenosis of lumbar region 09/26/2013    Class: Chronic   Hand joint pain 06/10/2013   Rotator cuff tear arthropathy of both shoulders 06/10/2013   Skin lesion of cheek 05/01/2013   Pain of right thumb 04/03/2013   Balance disorder 03/12/2013   Gait disorder 03/12/2013   Tremor 03/12/2013   Left hip pain 03/12/2013   Pre-ulcerative corn or callous 02/06/2013   Anxiety 11/12/2011   OSA on CPAP 11/07/2011   Bradycardia 10/20/2011   Insomnia 10/04/2011   Obesity 01/12/2011   Nausea and vomiting 09/28/2010   Type 2 diabetes mellitus with obesity (HCC) 09/27/2010   ERECTILE DYSFUNCTION, ORGANIC  05/30/2010   Degenerative disc disease, lumbar 04/19/2010   SCIATICA, LEFT 04/19/2010   Chronic pain syndrome 10/27/2009   Hyperlipidemia 07/15/2009   Essential hypertension 06/24/2009   Coronary artery disease involving native coronary artery of native heart without angina pectoris 06/24/2009   Allergic rhinitis 06/24/2009   Urethral stricture 06/24/2009   Osteoarthritis 06/24/2009   SHOULDER PAIN, BILATERAL 06/24/2009   Physical deconditioning 06/24/2009   NEPHROLITHIASIS, HX OF 06/24/2009   PCP:  Ileen [name], NP Pharmacy:   [facility] - [place], [place] - [address] [place] [address] Phone: [phone] Fax: [phone]  [facility] [address] [place], [place] - [address] [place] [address] Phone: [phone] Fax: [phone]

## 2023-12-30 NOTE — Evaluation (Signed)
 Physical Therapy Evaluation Patient Details Name: Travis QUEVEDO Sr. MRN: [id number] DOB: 28-Oct-1950 Today's Date: 12/30/2023  History of Present Illness  Travis [name]. is a 73 y.o. male, here presenting with fall.  Patient had a fall one day prior and landed on his buttock and back, EMS was called and vitals were stable so he was not transported to the ER;  He states that he called EMS due to worsening back pain and some chest pain; recently admitted to the hospital for elevated troponin and AKI.  Patient is on chronic pain medicine  Clinical Impression   Pt admitted with above diagnosis. Lives at home alone, in a single-level home with no steps to enter; Handicap accessible; Prior to admission, pt was able to manage at home modified independently, walking with prosthesis and cane as needed, walked a lot at [facility], until recent admission; Presents to PT with incr fall risk, generalized weakness, pain limiting activity tolerance, significant balance deficits, a significant functional decline compared to his baseline;    Today, he needed Max assist to sit on EOB; needed heavy assist to maintain balance sititng due to persistent posterior lean; Difficulty keeping weight forward, which made attempt at sit to stand unsafe; Further, would like to get the OK from pt's prosthetist before donning prosthesis over the anterior knee wound;   Pt tells me he has been to post-acute rehab before, with good results;  Patient will benefit from continued inpatient follow up therapy, <3 hours/day Pt currently with functional limitations due to the deficits listed below (see PT Problem List). Pt will benefit from skilled PT to increase their independence and safety with mobility to allow discharge to the venue listed below.           If plan is discharge home, recommend the following: A lot of help with walking and/or transfers;A lot of help with bathing/dressing/bathroom;Assistance with  cooking/housework;Assist for transportation   Can travel by private vehicle   No    Equipment Recommendations Wheelchair (measurements PT);Wheelchair cushion (measurements PT);Hospital bed  Recommendations for Other Services       Functional Status Assessment Patient has had a recent decline in their functional status and demonstrates the ability to make significant improvements in function in a reasonable and predictable amount of time.     Precautions / Restrictions Precautions Precautions: Fall Recall of Precautions/Restrictions: Intact Precaution/Restrictions Comments: h/o R BKA (has prosthetic LE in room, but wound anterior aspect of R knee -- would wait until prosthetist evaluates) Restrictions Other Position/Activity Restrictions: has prosthesis R LE in room, but wound anterior aspect of R knee -- would wait until prosthetist evaluates to put any pressure      Mobility  Bed Mobility Overal bed mobility: Needs Assistance Bed Mobility: Rolling, Supine to Sit, Sit to Supine Rolling: Min assist   Supine to sit: Max assist Sit to supine: Mod assist   General bed mobility comments: Attempted rolling to keep back precautions for comfort, but difficult on stretcher; Needed Max assist to elevate trunk to sit, and to stabilize due to posterior lean; assisted LEs b ack onto stretcher    Transfers Overall transfer level: Needs assistance                 General transfer comment: Ultimately unable to initiate due to heavy posterior bias    Ambulation/Gait                  Stairs  Wheelchair Mobility     Tilt Bed    Modified Rankin (Stroke Patients Only)       Balance Overall balance assessment: Needs assistance   Sitting balance-Leahy Scale: Zero Sitting balance - Comments: Sat edge of stretcher for 8-10 minutes, with up to Max assist to keep balance due to slow, but consistent posterior bias and drift; leaned back to eventually lay  perpendicular to stretcher x2 (pillow palced at rail), and needed Total assist to sit back up                                     Pertinent Vitals/Pain Pain Assessment Pain Assessment: Faces Faces Pain Scale: Hurts even more Pain Location: Back pain Pain Descriptors / Indicators: Aching, Grimacing, Guarding Pain Intervention(s): Monitored during session, Limited activity within patient's tolerance    Home Living Family/patient expects to be discharged to:: Private residence Living Arrangements: Alone Available Help at Discharge: Family;Friend(s);Available PRN/intermittently Type of Home: House Home Access: Ramped entrance       Home Layout: One level Home Equipment: Agricultural consultant (2 wheels);Rollator (4 wheels);Cane - single point;Shower seat;Grab bars - toilet;Grab bars - tub/shower Additional Comments: sleeps in recliner    Prior Function Prior Level of Function : Independent/Modified Independent;Working/employed             Mobility Comments: mod indep with rollator or SPC (depends on how I'm feeling), works, uses Sales executive for transportation to work, Chartered certified accountant for [employer], has groceries delivered ADLs Comments: mod indep with ADL/iADLs     Extremity/Trunk Assessment   Upper Extremity Assessment Upper Extremity Assessment: Generalized weakness;RUE deficits/detail RUE Deficits / Details: h/o L reverse TSA; reports needing R shoulder sx but waiting on R knee wound to heal    Lower Extremity Assessment Lower Extremity Assessment: Generalized weakness;RLE deficits/detail RLE Deficits / Details: h/o R BKA, chronic R knee wound that has been open the past few months; Pt states Travis [name], CPO, has evalusted the wound and made adjustments to prosthesis, and that it is OK to wear the prosthesis LLE Deficits / Details: functional strength >3/5; noted scrapes and skin changes lower leg    Cervical / Trunk Assessment Cervical / Trunk Assessment: Other  exceptions Cervical / Trunk Exceptions: REcent fall with back pain  Communication   Communication Communication: No apparent difficulties    Cognition Arousal: Alert Behavior During Therapy: WFL for tasks assessed/performed   PT - Cognitive impairments: Other                       PT - Cognition Comments: Will tend to say I've got it with respect to sitting balance as he is drifting backward into posterior bias, and ended up laying down perpendicular to stretcher (pillow placed at oppostie rail) x2 Following commands: Intact       Cueing Cueing Techniques: Verbal cues     General Comments General comments (skin integrity, edema, etc.): BP 111/63, HR 73, O2 sats 100% supine on strtcher end of session    Exercises     Assessment/Plan    PT Assessment Patient needs continued PT services  PT Problem List Decreased strength;Decreased range of motion;Decreased activity tolerance;Decreased balance;Decreased mobility;Decreased coordination;Decreased cognition;Decreased knowledge of use of DME;Decreased knowledge of precautions;Decreased safety awareness;Pain       PT Treatment Interventions DME instruction;Gait training;Functional mobility training;Therapeutic activities;Therapeutic exercise;Balance training;Patient/family education    PT Goals (Current goals can  be found in the Care Plan section)  Acute Rehab PT Goals Patient Stated Goal: be confident at home PT Goal Formulation: With patient Time For Goal Achievement: 01/13/24 Potential to Achieve Goals: Good    Frequency Min 1X/week     Co-evaluation               AM-PAC PT 6 Clicks Mobility  Outcome Measure Help needed turning from your back to your side while in a flat bed without using bedrails?: A Little Help needed moving from lying on your back to sitting on the side of a flat bed without using bedrails?: Total Help needed moving to and from a bed to a chair (including a wheelchair)?:  Total Help needed standing up from a chair using your arms (e.g., wheelchair or bedside chair)?: Total Help needed to walk in hospital room?: Total Help needed climbing 3-5 steps with a railing? : Total 6 Click Score: 8    End of Session   Activity Tolerance: Patient tolerated treatment well (though painful) Patient left: in bed;with call bell/phone within reach Nurse Communication: Mobility status PT Visit Diagnosis: Other abnormalities of gait and mobility (R26.89);Repeated falls (R29.6);Muscle weakness (generalized) (M62.81);History of falling (Z91.81);Adult, failure to thrive (R62.7);Pain Pain - part of body:  (bAck)    Time: 9244-9187 PT Time Calculation (min) (ACUTE ONLY): 17 min   Charges:   PT Evaluation $PT Eval Moderate Complexity: 1 Mod   PT General Charges $$ ACUTE PT VISIT: 1 Visit         Silvano Currier, PT  Acute Rehabilitation Services Office [phone] Secure Chat welcomed   Silvano VEAR Currier 12/30/2023, 9:00 AM

## 2023-12-30 NOTE — ED Provider Notes (Signed)
    I agree with this plan for patient to use wheelchair at home as outlined by RN Mae below      Durable Medical Equipment  (From admission, onward)                 Start     Ordered    12/30/23 0954   For home use only DME lightweight manual wheelchair with seat cushion  Once       Comments: Patient suffers from amputation, weakness  which impairs their ability to perform daily activities like toileting in the home.  A walker will not resolve  issue with performing activities of daily living. A wheelchair will allow patient to safely perform daily activities. Patient is not able to propel themselves in the home using a standard weight wheelchair due to general weakness. Patient can self propel in the lightweight wheelchair. Length of need Lifetime. Accessories: elevating leg rests (ELRs), wheel locks, extensions and anti-tippers.   12/30/23 9046                       Electronically signed by Mae Corean BROCKS, RN at 12/30/2023  9:54 AM   Pamella Ozell LABOR, DO 12/30/23 1219

## 2024-01-01 ENCOUNTER — Encounter: Payer: Self-pay | Admitting: Orthopedic Surgery

## 2024-01-03 ENCOUNTER — Other Ambulatory Visit: Payer: Self-pay | Admitting: Gastroenterology

## 2024-01-10 ENCOUNTER — Ambulatory Visit: Admitting: Orthopedic Surgery

## 2024-01-11 ENCOUNTER — Telehealth: Payer: Self-pay | Admitting: Family

## 2024-01-11 ENCOUNTER — Other Ambulatory Visit: Payer: Self-pay | Admitting: Gastroenterology

## 2024-01-11 NOTE — Telephone Encounter (Signed)
 Pt called asking for a blue bottle of wash that was give to him at his last appt. Pt is asking for a call if he can come and get another bottle. Please call pt at [phone]

## 2024-01-17 ENCOUNTER — Ambulatory Visit: Admitting: Orthopedic Surgery

## 2024-01-17 NOTE — Telephone Encounter (Signed)
 Pt is coming in today and can give him a new bottle of vashe

## 2024-01-18 ENCOUNTER — Other Ambulatory Visit: Payer: Self-pay

## 2024-01-18 ENCOUNTER — Encounter: Payer: Self-pay | Admitting: Physician Assistant

## 2024-01-18 ENCOUNTER — Ambulatory Visit (INDEPENDENT_AMBULATORY_CARE_PROVIDER_SITE_OTHER): Admitting: Physician Assistant

## 2024-01-18 DIAGNOSIS — S81001A Unspecified open wound, right knee, initial encounter: Secondary | ICD-10-CM

## 2024-01-18 DIAGNOSIS — M71161 Other infective bursitis, right knee: Secondary | ICD-10-CM
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
## 2024-01-23 ENCOUNTER — Other Ambulatory Visit: Payer: Self-pay

## 2024-01-23 ENCOUNTER — Encounter (HOSPITAL_COMMUNITY): Payer: Self-pay | Admitting: Orthopedic Surgery

## 2024-01-23 NOTE — Progress Notes (Signed)
 SDW CALL  Patient was given pre-op instructions over the phone. The opportunity was given for the patient to ask questions. No further questions asked. Patient verbalized understanding of instructions given.   PCP - Rosaline Ileen PIETY Cardiologist - Lonni Verlin COME  PPM/ICD - denies Device Orders -  Rep Notified -   Chest x-ray -  EKG - 12/20/23 Stress Test - 03/26/18 ECHO - 03/18/21 Cardiac Cath - 01/2013  Sleep Study - 04/10/18 CPAP - does not use  Fasting Blood Sugar - pt does not have a meter and doesn't check his blood sugar. Denies having diabetes since losing weight.  Checks Blood Sugar _____ times a day  Blood Thinner Instructions: Pt takes Effient . Dr. Harden notified. Pt instructed to call Dr. Crist office regarding holding medication.  Aspirin  Instructions:pt instructed to call Dr. Crist office . He states he will call today.   ERAS Protcol -clear liquids until 0430 PRE-SURGERY Ensure or G2- no  COVID TEST- na   Anesthesia review: yes- history of CAD,PCI,OSA-does not use CPAP  Patient denies shortness of breath, fever, cough and chest pain over the phone call     Special instructions:    Oral Hygiene is also important to reduce your risk of infection.  Remember - BRUSH YOUR TEETH THE MORNING OF SURGERY WITH YOUR REGULAR TOOTHPASTE

## 2024-01-23 NOTE — Progress Notes (Signed)
 Notified Dr. Harden of following information via secure chat. Pre op orders entered. Will call patient to see what instructions he has received regarding blood thinner and Mounjaro .
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
## 2024-01-24 NOTE — Progress Notes (Signed)
 Anesthesia Chart Review: Same day workup  73 year old male with pertinent history including former smoker (quit 2012) CAD s/p multiple stents (DES mLAD 2008, DED LCX 2008 & 2009, BMS mRCA 2009; patent stents on [facility] 01/2013), non-insulin -dependent DM2, HTN, HLD, GERD on PPI and H2 blocker, COPD, OSA not on CPAP, chronic prednisone  use, depression/anxiety, right BKA, chronic right knee ulcer, chronic pain syndrome, s/p sleeve gastrectomy 2020, s/p C3-4 ACDF 2015.  He had left reverse total shoulder on 03/17/21 and had a post-operative cardiology consult for intraoperative hypotension and bradycardia. By notes, an a-line had to be placed about 30 minutes into the case because ClearSight and NIBP were not correlating. Arterial hypotension noted with bradycardia/junctional bradycardia (known to have at least intermittent baseline bradycardia as 07/26/20 EKG showed SB at 42 bpm, first degree AVB) with rates predominantly 40-50's. He was given ephedrine , calcium  chloride, vasopressin  and atropine . Hypotension resolved post-operative. He was on telemetry post-operatively and noted to have intermittent bradycardia with findings of Mobitz 1 2nd degree AVB and brief 2:1 AVB but asymptomatic and not felt to require a PPM. Avoid AVN blocking agents recommended. Echo 03/18/2021 showed LVEF 65 to 70%, normal RV, no significant valvular abnormalities.  Prior nuclear stress test 03/2018 was negative for ischemia.  Last seen in cardiology follow-up with Orren Fabry, PA-C on 01/12/2023 for preop evaluation prior to SI joint fusion.  Per note, he was relatively active without symptoms, functional capacity 5.47 METS.  He was cleared at that time with an RCRI of 6.6% with no cardiovascular testing needed.  Recent admission 7/31 through 12/21/2023 for hypotension and AKI.  He was transported to the ER via EMS after a fall.  In the ER he was found to have a blood pressure 56/42, BUN of 30, creatinine of 2.34.  Underlying cause felt to  be simple volume depletion exacerbated by use of blood pressure medications and diuretic -improved with volume repletion.  He was also noted to have elevated troponin but the trend was not felt consistent with acute ischemia-likely mild demand ischemia/hypoperfusion due to hypotension-no chest pain and unremarkable EKG.  Seen in follow-up by orthopedics on 01/18/2024 for chronic right knee infection.  This has worsened despite antibiotics.  He was recommended to undergo excision of right prepatellar bursa.  CMP and CBC from 12/29/2023 reviewed, mild anemia hemoglobin 11.0.  Patient will need day of surgery evaluation.  EKG 12/20/2023: Sinus rhythm.  Rate 85. Ventricular premature complex. Borderline prolonged QT interval  TTE 03/18/2021: 1. Left ventricular ejection fraction, by estimation, is 65 to 70%. The  left ventricle has normal function. The left ventricle has no regional  wall motion abnormalities. Left ventricular diastolic parameters were  normal.   2. Right ventricular systolic function is normal. The right ventricular  size is normal.   3. The mitral valve is grossly normal. No evidence of mitral valve  regurgitation.   4. The aortic valve is grossly normal. Aortic valve regurgitation is not  visualized. No aortic stenosis is present.   5. Aortic dilatation noted. There is mild dilatation of the ascending  aorta, measuring 41 mm.   Nuclear stress 03/26/2018: Nuclear stress EF: 47%. There was no ST segment deviation noted during stress. No T wave inversion was noted during stress. This is an intermediate risk study due to reduced systolic function. There is no ischemia. The left ventricular ejection fraction is mildly decreased (45-54%).     Lynwood Geofm RIGGERS [facility] Short Stay Center/Anesthesiology Phone [phone] 01/24/2024 1:04 PM
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
## 2024-01-25 ENCOUNTER — Inpatient Hospital Stay (HOSPITAL_COMMUNITY)
Admission: AD | Admit: 2024-01-25 | Discharge: 2024-02-05 | DRG: 463 | Disposition: A | Discharge status: Home / Self Care | Attending: Orthopedic Surgery | Admitting: Orthopedic Surgery

## 2024-01-25 ENCOUNTER — Other Ambulatory Visit: Payer: Self-pay

## 2024-01-25 ENCOUNTER — Ambulatory Visit (HOSPITAL_COMMUNITY): Payer: Self-pay | Admitting: Physician Assistant

## 2024-01-25 ENCOUNTER — Encounter (HOSPITAL_COMMUNITY): Payer: Self-pay | Admitting: Orthopedic Surgery

## 2024-01-25 ENCOUNTER — Encounter (HOSPITAL_COMMUNITY)
Admission: AD | Disposition: A | Discharge status: Home / Self Care | Payer: Self-pay | Source: Home / Self Care | Attending: Orthopedic Surgery

## 2024-01-25 DIAGNOSIS — E669 Obesity, unspecified: Secondary | ICD-10-CM | POA: Diagnosis present

## 2024-01-25 DIAGNOSIS — E785 Hyperlipidemia, unspecified: Secondary | ICD-10-CM | POA: Diagnosis present

## 2024-01-25 DIAGNOSIS — I739 Peripheral vascular disease, unspecified: Secondary | ICD-10-CM | POA: Diagnosis present

## 2024-01-25 DIAGNOSIS — I252 Old myocardial infarction: Secondary | ICD-10-CM | POA: Diagnosis not present

## 2024-01-25 DIAGNOSIS — Z7982 Long term (current) use of aspirin: Secondary | ICD-10-CM

## 2024-01-25 DIAGNOSIS — M7100 Abscess of bursa, unspecified site: Secondary | ICD-10-CM | POA: Diagnosis present

## 2024-01-25 DIAGNOSIS — Z87891 Personal history of nicotine dependence: Secondary | ICD-10-CM

## 2024-01-25 DIAGNOSIS — G4733 Obstructive sleep apnea (adult) (pediatric): Secondary | ICD-10-CM | POA: Diagnosis present

## 2024-01-25 DIAGNOSIS — M7110 Other infective bursitis, unspecified site: Principal | ICD-10-CM

## 2024-01-25 DIAGNOSIS — M5412 Radiculopathy, cervical region: Secondary | ICD-10-CM | POA: Diagnosis present

## 2024-01-25 DIAGNOSIS — E1169 Type 2 diabetes mellitus with other specified complication: Secondary | ICD-10-CM | POA: Diagnosis present

## 2024-01-25 DIAGNOSIS — I2510 Atherosclerotic heart disease of native coronary artery without angina pectoris: Secondary | ICD-10-CM

## 2024-01-25 DIAGNOSIS — Z955 Presence of coronary angioplasty implant and graft: Secondary | ICD-10-CM

## 2024-01-25 DIAGNOSIS — Z96612 Presence of left artificial shoulder joint: Secondary | ICD-10-CM | POA: Diagnosis present

## 2024-01-25 DIAGNOSIS — Z833 Family history of diabetes mellitus: Secondary | ICD-10-CM

## 2024-01-25 DIAGNOSIS — I5032 Chronic diastolic (congestive) heart failure: Secondary | ICD-10-CM | POA: Diagnosis present

## 2024-01-25 DIAGNOSIS — Z96643 Presence of artificial hip joint, bilateral: Secondary | ICD-10-CM | POA: Diagnosis present

## 2024-01-25 DIAGNOSIS — M71161 Other infective bursitis, right knee: Secondary | ICD-10-CM | POA: Diagnosis not present

## 2024-01-25 DIAGNOSIS — Z89512 Acquired absence of left leg below knee: Secondary | ICD-10-CM

## 2024-01-25 DIAGNOSIS — K8689 Other specified diseases of pancreas: Secondary | ICD-10-CM | POA: Diagnosis present

## 2024-01-25 DIAGNOSIS — Z7985 Long-term (current) use of injectable non-insulin antidiabetic drugs: Secondary | ICD-10-CM

## 2024-01-25 DIAGNOSIS — F419 Anxiety disorder, unspecified: Secondary | ICD-10-CM | POA: Diagnosis present

## 2024-01-25 DIAGNOSIS — Z981 Arthrodesis status: Secondary | ICD-10-CM

## 2024-01-25 DIAGNOSIS — K219 Gastro-esophageal reflux disease without esophagitis: Secondary | ICD-10-CM | POA: Diagnosis present

## 2024-01-25 DIAGNOSIS — F329 Major depressive disorder, single episode, unspecified: Secondary | ICD-10-CM | POA: Diagnosis present

## 2024-01-25 DIAGNOSIS — J9611 Chronic respiratory failure with hypoxia: Secondary | ICD-10-CM | POA: Diagnosis present

## 2024-01-25 DIAGNOSIS — M7041 Prepatellar bursitis, right knee: Secondary | ICD-10-CM | POA: Diagnosis not present

## 2024-01-25 DIAGNOSIS — Z9884 Bariatric surgery status: Secondary | ICD-10-CM

## 2024-01-25 DIAGNOSIS — G894 Chronic pain syndrome: Secondary | ICD-10-CM | POA: Diagnosis present

## 2024-01-25 DIAGNOSIS — E1151 Type 2 diabetes mellitus with diabetic peripheral angiopathy without gangrene: Secondary | ICD-10-CM | POA: Diagnosis present

## 2024-01-25 DIAGNOSIS — D649 Anemia, unspecified: Secondary | ICD-10-CM | POA: Diagnosis present

## 2024-01-25 DIAGNOSIS — Z91148 Patient's other noncompliance with medication regimen for other reason: Secondary | ICD-10-CM

## 2024-01-25 DIAGNOSIS — N400 Enlarged prostate without lower urinary tract symptoms: Secondary | ICD-10-CM | POA: Diagnosis present

## 2024-01-25 DIAGNOSIS — I152 Hypertension secondary to endocrine disorders: Secondary | ICD-10-CM | POA: Diagnosis present

## 2024-01-25 DIAGNOSIS — Z89511 Acquired absence of right leg below knee: Secondary | ICD-10-CM

## 2024-01-25 DIAGNOSIS — I10 Essential (primary) hypertension: Secondary | ICD-10-CM | POA: Diagnosis present

## 2024-01-25 DIAGNOSIS — Z8249 Family history of ischemic heart disease and other diseases of the circulatory system: Secondary | ICD-10-CM

## 2024-01-25 DIAGNOSIS — G928 Other toxic encephalopathy: Secondary | ICD-10-CM | POA: Diagnosis not present

## 2024-01-25 DIAGNOSIS — M797 Fibromyalgia: Secondary | ICD-10-CM | POA: Diagnosis present

## 2024-01-25 DIAGNOSIS — Z96653 Presence of artificial knee joint, bilateral: Secondary | ICD-10-CM | POA: Diagnosis present

## 2024-01-25 DIAGNOSIS — M71061 Abscess of bursa, right knee: Principal | ICD-10-CM | POA: Diagnosis present

## 2024-01-25 DIAGNOSIS — E1159 Type 2 diabetes mellitus with other circulatory complications: Secondary | ICD-10-CM | POA: Diagnosis present

## 2024-01-25 HISTORY — PX: KNEE BURSECTOMY: SHX5882

## 2024-01-25 LAB — GLUCOSE, CAPILLARY
Glucose-Capillary: 70 mg/dL (ref 70–99)
Glucose-Capillary: 85 mg/dL (ref 70–99)
Glucose-Capillary: 90 mg/dL (ref 70–99)
Glucose-Capillary: 97 mg/dL (ref 70–99)
Glucose-Capillary: 174 mg/dL — ABNORMAL HIGH (ref 70–99)
Glucose-Capillary: 174 mg/dL — ABNORMAL HIGH (ref 70–99)

## 2024-01-25 LAB — CBC WITH DIFFERENTIAL/PLATELET
Abs Immature Granulocytes: 0 K/uL (ref 0.00–0.07)
Basophils Absolute: 0 K/uL (ref 0.0–0.1)
Basophils Relative: 2 %
Eosinophils Absolute: 0.1 K/uL (ref 0.0–0.5)
Eosinophils Relative: 5 %
HCT: 32.4 % — ABNORMAL LOW (ref 39.0–52.0)
Hemoglobin: 10.5 g/dL — ABNORMAL LOW (ref 13.0–17.0)
Immature Granulocytes: 0 %
Lymphocytes Relative: 53 %
Lymphs Abs: 1.2 K/uL (ref 0.7–4.0)
MCH: 29.3 pg (ref 26.0–34.0)
MCHC: 32.4 g/dL (ref 30.0–36.0)
MCV: 90.5 fL (ref 80.0–100.0)
Monocytes Absolute: 0.3 K/uL (ref 0.1–1.0)
Monocytes Relative: 11 %
Neutro Abs: 0.7 K/uL — ABNORMAL LOW (ref 1.7–7.7)
Neutrophils Relative %: 29 %
Platelets: 181 K/uL (ref 150–400)
RBC: 3.58 MIL/uL — ABNORMAL LOW (ref 4.22–5.81)
RDW: 13.3 % (ref 11.5–15.5)
WBC: 2.3 K/uL — ABNORMAL LOW (ref 4.0–10.5)
nRBC: 0 % (ref 0.0–0.2)

## 2024-01-25 LAB — HEMOGLOBIN A1C
Hgb A1c MFr Bld: 4.2 % — ABNORMAL LOW (ref 4.8–5.6)
Mean Plasma Glucose: 73.84 mg/dL

## 2024-01-25 LAB — CBC
HCT: 30.6 % — ABNORMAL LOW (ref 39.0–52.0)
Hemoglobin: 10 g/dL — ABNORMAL LOW (ref 13.0–17.0)
MCH: 29.2 pg (ref 26.0–34.0)
MCHC: 32.7 g/dL (ref 30.0–36.0)
MCV: 89.2 fL (ref 80.0–100.0)
Platelets: 187 K/uL (ref 150–400)
RBC: 3.43 MIL/uL — ABNORMAL LOW (ref 4.22–5.81)
RDW: 13.2 % (ref 11.5–15.5)
WBC: 1.5 K/uL — ABNORMAL LOW (ref 4.0–10.5)
nRBC: 0 % (ref 0.0–0.2)

## 2024-01-25 LAB — PATHOLOGIST SMEAR REVIEW

## 2024-01-25 LAB — COMPREHENSIVE METABOLIC PANEL WITH GFR
ALT: 14 U/L (ref 0–44)
AST: 24 U/L (ref 15–41)
Albumin: 3.1 g/dL — ABNORMAL LOW (ref 3.5–5.0)
Alkaline Phosphatase: 71 U/L (ref 38–126)
Anion gap: 12 (ref 5–15)
BUN: 9 mg/dL (ref 8–23)
CO2: 23 mmol/L (ref 22–32)
Calcium: 8.7 mg/dL — ABNORMAL LOW (ref 8.9–10.3)
Chloride: 108 mmol/L (ref 98–111)
Creatinine, Ser: 0.99 mg/dL (ref 0.61–1.24)
GFR, Estimated: >60 mL/min (ref >60)
Glucose, Bld: 92 mg/dL (ref 70–99)
Potassium: 4 mmol/L (ref 3.5–5.1)
Sodium: 143 mmol/L (ref 135–145)
Total Bilirubin: 1 mg/dL (ref 0.0–1.2)
Total Protein: 5.8 g/dL — ABNORMAL LOW (ref 6.5–8.1)

## 2024-01-25 LAB — CREATININE, SERUM
Creatinine, Ser: 0.96 mg/dL (ref 0.61–1.24)
GFR, Estimated: >60 mL/min (ref >60)

## 2024-01-25 SURGERY — BURSECTOMY, KNEE
Anesthesia: General | Site: Knee | Laterality: Right

## 2024-01-25 MED ORDER — VANCOMYCIN HCL 1000 MG IV SOLR
INTRAVENOUS | Status: DC | PRN
Start: 2024-01-25 — End: 2024-01-25
  Administered 2024-01-25: 1000 mg

## 2024-01-25 MED ORDER — 0.9 % SODIUM CHLORIDE (POUR BTL) OPTIME
TOPICAL | Status: DC | PRN
  Administered 2024-01-25: 1000 mL

## 2024-01-25 MED ORDER — HYDROMORPHONE HCL 1 MG/ML IJ SOLN
0.5000 mg | INTRAMUSCULAR | Status: DC | PRN
  Administered 2024-01-25 – 2024-01-30 (×16): 1 mg via INTRAVENOUS
  Filled 2024-01-25 (×17): qty 1

## 2024-01-25 MED ORDER — LIDOCAINE 2% (20 MG/ML) 5 ML SYRINGE
INTRAMUSCULAR | Status: AC
  Filled 2024-01-25: qty 5

## 2024-01-25 MED ORDER — PANTOPRAZOLE SODIUM 40 MG PO TBEC
40.0000 mg | DELAYED_RELEASE_TABLET | Freq: Three times a day (TID) | ORAL | Status: DC
  Administered 2024-01-25 – 2024-02-05 (×33): 40 mg via ORAL
  Filled 2024-01-25 (×20): qty 1
  Filled 2024-01-25: qty 2
  Filled 2024-01-25 (×12): qty 1

## 2024-01-25 MED ORDER — ONDANSETRON HCL 4 MG/2ML IJ SOLN
INTRAMUSCULAR | Status: DC | PRN
  Administered 2024-01-25: 4 mg via INTRAVENOUS

## 2024-01-25 MED ORDER — ATORVASTATIN CALCIUM 10 MG PO TABS
20.0000 mg | ORAL_TABLET | Freq: Every day | ORAL | Status: DC
  Administered 2024-01-25 – 2024-02-05 (×12): 20 mg via ORAL
  Filled 2024-01-25 (×12): qty 2

## 2024-01-25 MED ORDER — ACETAMINOPHEN 10 MG/ML IV SOLN
1000.0000 mg | Freq: Once | INTRAVENOUS | Status: DC | PRN
  Administered 2024-01-25: 1000 mg via INTRAVENOUS

## 2024-01-25 MED ORDER — OXYCODONE HCL 5 MG/5ML PO SOLN: 5.0000 mg | Freq: Once | ORAL | Status: DC | PRN

## 2024-01-25 MED ORDER — EMTRICITABINE-TENOFOVIR AF 200-25 MG PO TABS
1.0000 | ORAL_TABLET | Freq: Every day | ORAL | Status: DC
  Administered 2024-01-25 – 2024-02-05 (×12): 1 via ORAL
  Filled 2024-01-25 (×12): qty 1

## 2024-01-25 MED ORDER — ACETAMINOPHEN 10 MG/ML IV SOLN
INTRAVENOUS | Status: AC
  Filled 2024-01-25: qty 100

## 2024-01-25 MED ORDER — INSULIN ASPART 100 UNIT/ML IJ SOLN
0.0000 [IU] | Freq: Three times a day (TID) | INTRAMUSCULAR | Status: DC
  Administered 2024-01-25: 3 [IU] via SUBCUTANEOUS
  Administered 2024-01-26 – 2024-02-02 (×5): 2 [IU] via SUBCUTANEOUS

## 2024-01-25 MED ORDER — ARIPIPRAZOLE 5 MG PO TABS
15.0000 mg | ORAL_TABLET | Freq: Every evening | ORAL | Status: DC
  Administered 2024-01-25 – 2024-02-04 (×11): 15 mg via ORAL
  Filled 2024-01-25 (×11): qty 1

## 2024-01-25 MED ORDER — DOCUSATE SODIUM 100 MG PO CAPS
100.0000 mg | ORAL_CAPSULE | Freq: Two times a day (BID) | ORAL | Status: DC
  Administered 2024-01-25 – 2024-01-31 (×11): 100 mg via ORAL
  Filled 2024-01-25 (×17): qty 1

## 2024-01-25 MED ORDER — LACTATED RINGERS IV SOLN: INTRAVENOUS | Status: DC

## 2024-01-25 MED ORDER — ACETAMINOPHEN 500 MG PO TABS
1000.0000 mg | ORAL_TABLET | Freq: Four times a day (QID) | ORAL | Status: AC
  Administered 2024-01-25 – 2024-01-26 (×3): 1000 mg via ORAL
  Filled 2024-01-25 (×4): qty 2

## 2024-01-25 MED ORDER — ORAL CARE MOUTH RINSE: 15.0000 mL | Freq: Once | OROMUCOSAL | Status: AC

## 2024-01-25 MED ORDER — FENTANYL CITRATE (PF) 100 MCG/2ML IJ SOLN
INTRAMUSCULAR | Status: AC
  Filled 2024-01-25: qty 2

## 2024-01-25 MED ORDER — PRASUGREL HCL 10 MG PO TABS
5.0000 mg | ORAL_TABLET | Freq: Every day | ORAL | Status: DC
  Administered 2024-01-26 – 2024-02-05 (×11): 5 mg via ORAL
  Filled 2024-01-25 (×12): qty 1

## 2024-01-25 MED ORDER — FENTANYL CITRATE (PF) 100 MCG/2ML IJ SOLN
25.0000 ug | INTRAMUSCULAR | Status: DC | PRN
  Administered 2024-01-25 (×3): 50 ug via INTRAVENOUS

## 2024-01-25 MED ORDER — GLYCOPYRROLATE PF 0.2 MG/ML IJ SOSY
PREFILLED_SYRINGE | INTRAMUSCULAR | Status: AC
  Filled 2024-01-25: qty 1

## 2024-01-25 MED ORDER — TIRZEPATIDE 10 MG/0.5ML ~~LOC~~ SOAJ: 7.5000 mg | SUBCUTANEOUS | Status: DC

## 2024-01-25 MED ORDER — ORAL CARE MOUTH RINSE
15.0000 mL | OROMUCOSAL | Status: DC | PRN
Start: 2024-01-25 — End: 2024-02-05

## 2024-01-25 MED ORDER — PANCRELIPASE (LIP-PROT-AMYL) 12000-38000 UNITS PO CPEP
24000.0000 [IU] | ORAL_CAPSULE | ORAL | Status: DC | PRN
  Administered 2024-01-25 – 2024-01-29 (×4): 24000 [IU] via ORAL
  Filled 2024-01-25 (×6): qty 2

## 2024-01-25 MED ORDER — PANCRELIPASE (LIP-PROT-AMYL) 12000-38000 UNITS PO CPEP
36000.0000 [IU] | ORAL_CAPSULE | Freq: Three times a day (TID) | ORAL | Status: DC
  Administered 2024-01-25 – 2024-02-05 (×32): 36000 [IU] via ORAL
  Filled 2024-01-25 (×35): qty 3

## 2024-01-25 MED ORDER — LIDOCAINE 2% (20 MG/ML) 5 ML SYRINGE
INTRAMUSCULAR | Status: DC | PRN
  Administered 2024-01-25: 80 mg via INTRAVENOUS

## 2024-01-25 MED ORDER — CEFAZOLIN SODIUM-DEXTROSE 2-4 GM/100ML-% IV SOLN
2.0000 g | Freq: Four times a day (QID) | INTRAVENOUS | Status: AC
  Administered 2024-01-25 (×2): 2 g via INTRAVENOUS
  Filled 2024-01-25 (×2): qty 100

## 2024-01-25 MED ORDER — OXYCODONE HCL 5 MG PO TABS
5.0000 mg | ORAL_TABLET | ORAL | Status: DC | PRN
  Administered 2024-01-31: 10 mg via ORAL
  Filled 2024-01-25: qty 1
  Filled 2024-01-25: qty 2

## 2024-01-25 MED ORDER — METHOCARBAMOL 500 MG PO TABS
500.0000 mg | ORAL_TABLET | Freq: Three times a day (TID) | ORAL | Status: DC
  Administered 2024-01-25 – 2024-02-05 (×33): 500 mg via ORAL
  Filled 2024-01-25 (×32): qty 1

## 2024-01-25 MED ORDER — DEXAMETHASONE SODIUM PHOSPHATE 10 MG/ML IJ SOLN
INTRAMUSCULAR | Status: DC | PRN
  Administered 2024-01-25: 10 mg via INTRAVENOUS

## 2024-01-25 MED ORDER — CHLORHEXIDINE GLUCONATE 0.12 % MT SOLN
15.0000 mL | Freq: Once | OROMUCOSAL | Status: AC
  Administered 2024-01-25: 15 mL via OROMUCOSAL
  Filled 2024-01-25: qty 15

## 2024-01-25 MED ORDER — PROPOFOL 10 MG/ML IV BOLUS
INTRAVENOUS | Status: DC | PRN
  Administered 2024-01-25: 150 mg via INTRAVENOUS
  Administered 2024-01-25: 30 mg via INTRAVENOUS
  Administered 2024-01-25: 20 mg via INTRAVENOUS

## 2024-01-25 MED ORDER — SODIUM CHLORIDE 0.9 % IV SOLN: INTRAVENOUS | Status: AC

## 2024-01-25 MED ORDER — ENOXAPARIN SODIUM 40 MG/0.4ML IJ SOSY
40.0000 mg | PREFILLED_SYRINGE | INTRAMUSCULAR | Status: DC
  Administered 2024-01-26 – 2024-02-04 (×10): 40 mg via SUBCUTANEOUS
  Filled 2024-01-25 (×10): qty 0.4

## 2024-01-25 MED ORDER — METOCLOPRAMIDE HCL 5 MG/ML IJ SOLN: 5.0000 mg | Freq: Three times a day (TID) | INTRAMUSCULAR | Status: DC | PRN

## 2024-01-25 MED ORDER — ONDANSETRON HCL 4 MG/2ML IJ SOLN: 4.0000 mg | Freq: Four times a day (QID) | INTRAMUSCULAR | Status: DC | PRN

## 2024-01-25 MED ORDER — FENTANYL CITRATE (PF) 250 MCG/5ML IJ SOLN
INTRAMUSCULAR | Status: DC | PRN
  Administered 2024-01-25 (×2): 50 ug via INTRAVENOUS

## 2024-01-25 MED ORDER — VANCOMYCIN HCL 1000 MG IV SOLR
INTRAVENOUS | Status: AC
  Filled 2024-01-25: qty 20

## 2024-01-25 MED ORDER — TRAZODONE HCL 150 MG PO TABS
150.0000 mg | ORAL_TABLET | Freq: Every day | ORAL | Status: DC
  Administered 2024-01-25 – 2024-02-04 (×11): 150 mg via ORAL
  Filled 2024-01-25 (×12): qty 1

## 2024-01-25 MED ORDER — PREDNISONE 20 MG PO TABS: 20.0000 mg | ORAL_TABLET | Freq: Every day | ORAL | Status: DC

## 2024-01-25 MED ORDER — ONDANSETRON HCL 4 MG PO TABS: 4.0000 mg | ORAL_TABLET | Freq: Four times a day (QID) | ORAL | Status: DC | PRN

## 2024-01-25 MED ORDER — INSULIN ASPART 100 UNIT/ML IJ SOLN: 0.0000 [IU] | INTRAMUSCULAR | Status: DC | PRN

## 2024-01-25 MED ORDER — ZOLPIDEM TARTRATE 5 MG PO TABS
10.0000 mg | ORAL_TABLET | Freq: Every day | ORAL | Status: DC
  Administered 2024-01-25 – 2024-02-04 (×11): 10 mg via ORAL
  Filled 2024-01-25 (×12): qty 2

## 2024-01-25 MED ORDER — PHENYLEPHRINE 80 MCG/ML (10ML) SYRINGE FOR IV PUSH (FOR BLOOD PRESSURE SUPPORT)
PREFILLED_SYRINGE | INTRAVENOUS | Status: DC | PRN
  Administered 2024-01-25 (×2): 120 ug via INTRAVENOUS

## 2024-01-25 MED ORDER — METHYLPREDNISOLONE ACETATE 40 MG/ML IJ SUSP
40.0000 mg | Freq: Once | INTRAMUSCULAR | Status: DC
Start: 2024-01-25 — End: 2024-01-25

## 2024-01-25 MED ORDER — MIDAZOLAM HCL 2 MG/2ML IJ SOLN
INTRAMUSCULAR | Status: AC
  Filled 2024-01-25: qty 2

## 2024-01-25 MED ORDER — ACETAMINOPHEN 325 MG PO TABS
325.0000 mg | ORAL_TABLET | Freq: Four times a day (QID) | ORAL | Status: DC | PRN
  Administered 2024-01-27 – 2024-02-05 (×10): 650 mg via ORAL
  Filled 2024-01-25 (×9): qty 2

## 2024-01-25 MED ORDER — GLYCOPYRROLATE PF 0.2 MG/ML IJ SOSY
PREFILLED_SYRINGE | INTRAMUSCULAR | Status: DC | PRN
Start: 2024-01-25 — End: 2024-01-25
  Administered 2024-01-25: .2 mg via INTRAVENOUS

## 2024-01-25 MED ORDER — DULOXETINE HCL 60 MG PO CPEP
60.0000 mg | ORAL_CAPSULE | Freq: Two times a day (BID) | ORAL | Status: DC
  Administered 2024-01-25 – 2024-02-05 (×23): 60 mg via ORAL
  Filled 2024-01-25 (×24): qty 1

## 2024-01-25 MED ORDER — FLUTICASONE PROPIONATE 50 MCG/ACT NA SUSP
2.0000 | Freq: Every day | NASAL | Status: DC
  Administered 2024-01-25 – 2024-02-04 (×6): 2 via NASAL
  Filled 2024-01-25: qty 16

## 2024-01-25 MED ORDER — DONEPEZIL HCL 10 MG PO TABS
10.0000 mg | ORAL_TABLET | Freq: Every day | ORAL | Status: DC
  Administered 2024-01-25 – 2024-02-04 (×11): 10 mg via ORAL
  Filled 2024-01-25 (×11): qty 1

## 2024-01-25 MED ORDER — TAMSULOSIN HCL 0.4 MG PO CAPS
0.4000 mg | ORAL_CAPSULE | Freq: Two times a day (BID) | ORAL | Status: DC
  Administered 2024-01-25 – 2024-02-05 (×22): 0.4 mg via ORAL
  Filled 2024-01-25 (×23): qty 1

## 2024-01-25 MED ORDER — FENTANYL CITRATE (PF) 100 MCG/2ML IJ SOLN
INTRAMUSCULAR | Status: AC
Start: 2024-01-25 — End: 2024-01-25
  Filled 2024-01-25: qty 2

## 2024-01-25 MED ORDER — HYDROMORPHONE HCL 1 MG/ML IJ SOLN
INTRAMUSCULAR | Status: AC
  Filled 2024-01-25: qty 0.5

## 2024-01-25 MED ORDER — HYDROMORPHONE HCL 1 MG/ML IJ SOLN
INTRAMUSCULAR | Status: DC | PRN
  Administered 2024-01-25 (×2): .5 mg via INTRAVENOUS

## 2024-01-25 MED ORDER — FAMOTIDINE 20 MG PO TABS
40.0000 mg | ORAL_TABLET | Freq: Every day | ORAL | Status: DC
  Administered 2024-01-25 – 2024-02-04 (×11): 40 mg via ORAL
  Filled 2024-01-25 (×11): qty 2

## 2024-01-25 MED ORDER — METOCLOPRAMIDE HCL 5 MG PO TABS: 5.0000 mg | ORAL_TABLET | Freq: Three times a day (TID) | ORAL | Status: DC | PRN

## 2024-01-25 MED ORDER — ASPIRIN 81 MG PO TBEC
81.0000 mg | DELAYED_RELEASE_TABLET | Freq: Every day | ORAL | Status: DC
  Administered 2024-01-25 – 2024-02-04 (×11): 81 mg via ORAL
  Filled 2024-01-25 (×11): qty 1

## 2024-01-25 MED ORDER — VASHE WOUND IRRIGATION OPTIME
TOPICAL | Status: DC | PRN
Start: 2024-01-25 — End: 2024-01-25
  Administered 2024-01-25: 34 [oz_av]

## 2024-01-25 MED ORDER — OXYCODONE HCL 5 MG PO TABS
10.0000 mg | ORAL_TABLET | ORAL | Status: DC | PRN
  Administered 2024-01-25 – 2024-01-26 (×3): 15 mg via ORAL
  Administered 2024-01-26: 10 mg via ORAL
  Administered 2024-01-26 – 2024-01-31 (×16): 15 mg via ORAL
  Filled 2024-01-25 (×2): qty 3
  Filled 2024-01-25: qty 2
  Filled 2024-01-25 (×14): qty 3
  Filled 2024-01-25: qty 2
  Filled 2024-01-25 (×2): qty 3

## 2024-01-25 MED ORDER — GABAPENTIN 400 MG PO CAPS
1200.0000 mg | ORAL_CAPSULE | Freq: Three times a day (TID) | ORAL | Status: DC
  Administered 2024-01-25 – 2024-02-01 (×21): 1200 mg via ORAL
  Filled 2024-01-25 (×21): qty 3

## 2024-01-25 MED ORDER — OXYCODONE HCL 5 MG PO TABS: 5.0000 mg | ORAL_TABLET | Freq: Once | ORAL | Status: DC | PRN

## 2024-01-25 MED ORDER — ONDANSETRON HCL 4 MG/2ML IJ SOLN: 4.0000 mg | Freq: Once | INTRAMUSCULAR | Status: DC | PRN

## 2024-01-25 MED ORDER — CEFAZOLIN SODIUM-DEXTROSE 2-4 GM/100ML-% IV SOLN
2.0000 g | INTRAVENOUS | Status: AC
  Administered 2024-01-25: 2 g via INTRAVENOUS
  Filled 2024-01-25: qty 100

## 2024-01-25 SURGICAL SUPPLY — 38 items
BAG COUNTER SPONGE SURGICOUNT (BAG) ×1 IMPLANT
BLADE SURG 10 STRL SS (BLADE) ×1 IMPLANT
BNDG COHESIVE 6X5 TAN ST LF (GAUZE/BANDAGES/DRESSINGS) ×2 IMPLANT
BNDG COMPR ESMARK 4X3 LF (GAUZE/BANDAGES/DRESSINGS) IMPLANT
BNDG GAUZE DERMACEA FLUFF 4 (GAUZE/BANDAGES/DRESSINGS) ×1 IMPLANT
CLEANSER WND VASHE 34 (WOUND CARE) IMPLANT
COTTON STERILE ROLL (GAUZE/BANDAGES/DRESSINGS) ×1 IMPLANT
COVER SURGICAL LIGHT HANDLE (MISCELLANEOUS) ×1 IMPLANT
CUFF TOURN SGL QUICK 42 (TOURNIQUET CUFF) IMPLANT
CUFF TRNQT CYL 34X4.125X (TOURNIQUET CUFF) IMPLANT
DRAPE INCISE IOBAN 66X45 STRL (DRAPES) ×1 IMPLANT
DRAPE U-SHAPE 47X51 STRL (DRAPES) ×1 IMPLANT
DRSG ADAPTIC 3X8 NADH LF (GAUZE/BANDAGES/DRESSINGS) ×1 IMPLANT
DRSG VAC PEEL AND PLACE LRG (GAUZE/BANDAGES/DRESSINGS) IMPLANT
DURAPREP 26ML APPLICATOR (WOUND CARE) ×1 IMPLANT
ELECTRODE REM PT RTRN 9FT ADLT (ELECTROSURGICAL) ×1 IMPLANT
GAUZE SPONGE 4X4 12PLY STRL (GAUZE/BANDAGES/DRESSINGS) ×1 IMPLANT
GLOVE BIOGEL PI IND STRL 9 (GLOVE) ×1 IMPLANT
GLOVE SURG ORTHO 9.0 STRL STRW (GLOVE) ×1 IMPLANT
GOWN STRL REUS W/ TWL XL LVL3 (GOWN DISPOSABLE) ×3 IMPLANT
GRAFT SKIN WND MARIGEN OMEGA3 (Tissue) IMPLANT
KIT DRSG PREVENA PLUS 7DAY 125 (MISCELLANEOUS) IMPLANT
KIT TURNOVER KIT B (KITS) ×1 IMPLANT
MANIFOLD NEPTUNE II (INSTRUMENTS) ×1 IMPLANT
NDL SUT .5 MAYO 1.404X.05X (NEEDLE) ×1 IMPLANT
NS IRRIG 1000ML POUR BTL (IV SOLUTION) ×1 IMPLANT
PACK ORTHO EXTREMITY (CUSTOM PROCEDURE TRAY) ×1 IMPLANT
PAD ARMBOARD POSITIONER FOAM (MISCELLANEOUS) ×2 IMPLANT
SPONGE T-LAP 4X18 ~~LOC~~+RFID (SPONGE) ×2 IMPLANT
SUT ETHILON 2 0 PSLX (SUTURE) IMPLANT
SUT ETHILON 3 0 FSLX (SUTURE) ×1 IMPLANT
SUT MNCRL AB 3-0 PS2 18 (SUTURE) ×1 IMPLANT
SUTURE FIBERWR #2 38 T-5 BLUE (SUTURE) ×4 IMPLANT
TOWEL GREEN STERILE (TOWEL DISPOSABLE) ×1 IMPLANT
TOWEL GREEN STERILE FF (TOWEL DISPOSABLE) ×1 IMPLANT
TUBE CONNECTING 12X1/4 (SUCTIONS) ×1 IMPLANT
WATER STERILE IRR 1000ML POUR (IV SOLUTION) ×1 IMPLANT
YANKAUER SUCT BULB TIP NO VENT (SUCTIONS) ×1 IMPLANT

## 2024-01-25 NOTE — Op Note (Signed)
 01/25/2024  8:11 AM  PATIENT:  Carlin FORBES Liverpool Sr.    PRE-OPERATIVE DIAGNOSIS:  Pre-patella Infected Bursa Right Knee  POST-OPERATIVE DIAGNOSIS:  Same  PROCEDURE:  BURSECTOMY, KNEE  SURGEON:  Jerona LULLA Sage, MD  PHYSICIAN ASSISTANT: April Green ANESTHESIA:   General  PREOPERATIVE INDICATIONS:  Ronelle Smallman. is a  73 y.o. male with a diagnosis of Pre-patella Infected Bursa Right Knee who failed conservative measures and elected for surgical management.    The risks benefits and alternatives were discussed with the patient preoperatively including but not limited to the risks of infection, bleeding, nerve injury, cardiopulmonary complications, the need for revision surgery, among others, and the patient was willing to proceed.  OPERATIVE IMPLANTS:   Implant Name Type Inv. Item Serial No. Manufacturer Lot No. LRB No. Used Action  GRAFT SKIN WND MARIGEN OMEGA3 - [id number] Tissue GRAFT SKIN WND MARIGEN OMEGA3  [employer] [id number] Right 1 Implanted    @ENCIMAGES @  OPERATIVE FINDINGS: Infected bursa sent for cultures.  There was good petechial bleeding no evidence of osteomyelitis of the patella.  OPERATIVE PROCEDURE: Patient brought to the operating room and underwent general anesthetic.  After adequate was anesthesia obtained patient's right lower extremity was prepped using DuraPrep draped into a sterile field a timeout was called.  Elliptical incision was made around the ulcerative tissue and the electrocautery was used to excise the prepatellar bursa and 1 block of tissue this was sent for cultures.  Wound was irrigated with Vashe electrocautery was used for further hemostasis.  The tissue margins were undermined for allow for local tissue transfer.  Local tissue transfer was used to close the wound 10 x 4 cm.  A peel in place Prevena wound VAC was applied this had a good suction fit this was overwrapped with a stump shrinker patient was extubated taken the PACU in  stable condition.   DISCHARGE PLANNING:  Antibiotic duration: Continue antibiotics adjust according to tissue cultures  Weightbearing: Nonweightbearing on the right  Pain medication: Opioid pathway  Dressing care/ Wound VAC: Wound VAC on the right for 1 week  Ambulatory devices: Walker or crutches  Discharge to: Anticipate discharge to home once patient is stable with ambulation.  Patient will not be able to wear the prosthetic leg.  Follow-up: In the office 1 week post operative.

## 2024-01-25 NOTE — TOC Initial Note (Signed)
 Transition of Care (TOC) - Initial/Assessment Note    Patient Details  Name: Travis MCFARREN Sr. MRN: [id number] Date of Birth: [birth year]  Transition of Care [facility]) CM/SW Contact:    Nola Devere Hands, RN Phone Number: 01/25/2024, 3:22 PM  Clinical Narrative:                 Case Manager notified by Daphne Gaba [facility] Health Liaison that patient is active with [facility] for Home Health RN/PT/OT., will need resumption of care orders when patient medically ready. CM will continue to follow.   Expected Discharge Plan: Home w Home Health Services Barriers to Discharge: Continued Medical Work up   Patient Goals and CMS Choice            Expected Discharge Plan and Services     Post Acute Care Choice: Resumption of Svcs/PTA Provider, Home Health                             HH Arranged: RN, PT, OT [facility] Agency: [facility] Date [facility] Agency Contacted: 01/25/24 Oscar contacted CM)   Representative spoke with at [facility] Agency: Daphne  Prior Living Arrangements/Services                       Activities of Daily Living   ADL Screening (condition at time of admission) Independently performs ADLs?: Yes (appropriate for developmental age) Is the patient deaf or have difficulty hearing?: No Does the patient have difficulty seeing, even when wearing glasses/contacts?: No Does the patient have difficulty concentrating, remembering, or making decisions?: No  Permission Sought/Granted                  Emotional Assessment              Admission diagnosis:  Infected prepatellar bursa, right [M71.161] Abscess in bursa [M71.00] Patient Active Problem List   Diagnosis Date Noted   Infection of bursa 01/25/2024   Prepatellar bursitis of right knee 01/25/2024   Abscess in bursa 01/25/2024   Hypotension 12/20/2023   Failed back syndrome of lumbar spine 08/04/2022   Arthropathy of both sacroiliac joints 04/06/2022   Colitis due to enteroinvasive  Escherichia coli 11/28/2021   Hypophosphatemia 11/27/2021   BPH (benign prostatic hyperplasia) 11/26/2021   Pancolitis (HCC) 11/26/2021   Pancreatic insufficiency 11/26/2021   Sepsis (HCC) 11/25/2021   Chronic sinusitis 11/23/2021   Hypertension associated with type 2 diabetes mellitus (HCC) 10/25/2021   Foot callus 10/06/2021   Folliculitis 10/06/2021   Central sleep apnea comorbid with prescribed opioid use 08/17/2021   Obstructive sleep apnea on CPAP 08/17/2021   Chronic intermittent hypoxia with obstructive sleep apnea 08/17/2021   Subjective memory complaints 08/17/2021   Word finding difficulty 08/17/2021   Eating disorder 06/28/2021   Abnormal laboratory test 06/21/2021   COVID-19 virus infection 06/03/2021   Arthritis of left shoulder region    AV block, Mobitz 2 03/18/2021   S/P reverse total shoulder arthroplasty, left 03/17/2021   Iliotibial band syndrome of both sides 03/16/2021   Chronic respiratory failure with hypoxia (HCC) 02/26/2021   Dyspnea 01/14/2021   Nasal turbinate hypertrophy 11/16/2020   Sinusitis 11/02/2020   Memory loss or impairment 10/11/2020   Right arm pain 09/18/2020   Aortic atherosclerosis (HCC) 09/16/2020   Spondylolisthesis, lumbar region    Status post lumbar spinal fusion 07/27/2020   Nasal sinus polyp 06/16/2020   Sacroiliac joint pain 04/02/2020  Vitamin D  deficiency 02/24/2020   Cough 07/29/2019   Wheezing 07/29/2019   Possible exposure to STD 07/24/2019   Unilateral primary osteoarthritis, left hip 06/06/2019   Status post total replacement of left hip 06/06/2019   S/P laparoscopic sleeve gastrectomy 03/03/2019   Rash 01/24/2019   Fever 08/15/2018   UTI (urinary tract infection) 08/15/2018   Fall at home, initial encounter 08/15/2018   Normocytic anemia 08/15/2018   Hypokalemia 08/15/2018   Bowel incontinence 07/25/2018   Other spondylosis with radiculopathy, lumbar region 07/16/2018    Class: Chronic   Status post lumbar  laminectomy 07/16/2018   Status post THR (total hip replacement) 04/17/2018   Unilateral primary osteoarthritis, right hip    Myocardial infarction (HCC) 02/25/2018   Coronary artery disease 02/25/2018   PAD (peripheral artery disease) (HCC) 02/25/2018   S/P BKA (below knee amputation) unilateral, right (HCC) 02/25/2018   Sacroiliitis (HCC) 02/25/2018   SOB (shortness of breath) 01/03/2018   Acute on chronic heart failure (HCC) 01/03/2018   Severe right groin pain 12/20/2017   Morbid obesity (HCC) 10/09/2017   Low back pain 07/13/2017   Leukoplakia, tongue 01/26/2017   Skin lesion 10/20/2016   S/P unilateral BKA (below knee amputation), right (HCC) 06/14/2016   Charcot foot due to diabetes mellitus (HCC)    Charcot's arthropathy associated with type 2 diabetes mellitus (HCC) 04/11/2016   Encounter for well adult exam with abnormal findings 11/05/2015   Major depression 09/13/2015   S/P TKR (total knee replacement) bilaterally 09/13/2015   GERD (gastroesophageal reflux disease) 09/08/2015   S/P laparoscopic hernia repair 05/11/2015   PPD positive 04/08/2015   Benign neoplasm of descending colon    Benign neoplasm of cecum    AKI (acute kidney injury) (HCC) 10/18/2014   Acute blood loss as cause of postoperative anemia    Chronic anticoagulation    Occult blood positive stool 10/17/2014   General weakness 07/14/2014   Urinary incontinence 07/14/2014   Controlled diabetes mellitus with hyperglycemia (HCC) 05/06/2014   Hypotension during surgery 01/30/2014    Class: Acute   Headache 10/15/2013   Spinal stenosis in cervical region 09/26/2013    Class: Chronic   Congenital spinal stenosis of lumbar region 09/26/2013    Class: Chronic   Hand joint pain 06/10/2013   Rotator cuff tear arthropathy of both shoulders 06/10/2013   Skin lesion of cheek 05/01/2013   Pain of right thumb 04/03/2013   Balance disorder 03/12/2013   Gait disorder 03/12/2013   Tremor 03/12/2013   Left hip  pain 03/12/2013   Pre-ulcerative corn or callous 02/06/2013   Anxiety 11/12/2011   OSA on CPAP 11/07/2011   Bradycardia 10/20/2011   Insomnia 10/04/2011   Obesity 01/12/2011   Nausea and vomiting 09/28/2010   Type 2 diabetes mellitus with obesity (HCC) 09/27/2010   ERECTILE DYSFUNCTION, ORGANIC 05/30/2010   Degenerative disc disease, lumbar 04/19/2010   SCIATICA, LEFT 04/19/2010   Chronic pain syndrome 10/27/2009   Hyperlipidemia 07/15/2009   Essential hypertension 06/24/2009   Coronary artery disease involving native coronary artery of native heart without angina pectoris 06/24/2009   Allergic rhinitis 06/24/2009   Urethral stricture 06/24/2009   Osteoarthritis 06/24/2009   SHOULDER PAIN, BILATERAL 06/24/2009   Physical deconditioning 06/24/2009   NEPHROLITHIASIS, HX OF 06/24/2009   PCP:  Ileen [name], NP Pharmacy:   [facility] - [place], [place] - [address] [place] [address] Phone: [phone] Fax: [phone]  [facility] [phone] -  [place], [place] - [address] [place] [address] Phone: [phone] Fax: [phone]  [facility] [address] [place] [address] Phone: [phone] Fax: [phone]     Social Drivers of Health (SDOH) Social History: SDOH Screenings   Food Insecurity: No Food Insecurity (01/25/2024)  Housing: Low Risk  (01/25/2024)  Transportation Needs: No Transportation Needs (01/25/2024)  Utilities: Not At Risk (01/25/2024)  Depression (PHQ2-9): Medium Risk (08/15/2023)  Financial Resource Strain: High Risk (10/10/2022)   Received from [facility]  Physical Activity: Unknown (10/10/2022)   Received from [facility]  Social Connections: Socially Isolated (01/25/2024)  Stress: Stress Concern Present (10/10/2022)   Received from [facility]  Tobacco Use: Medium Risk (01/25/2024)   SDOH Interventions:      Readmission Risk Interventions     No data to display

## 2024-01-25 NOTE — Transfer of Care (Signed)
 Immediate Anesthesia Transfer of Care Note  Patient: Travis FORBES Liverpool Sr.  Procedure(s) Performed: BURSECTOMY, KNEE (Right: Knee)  Patient Location: PACU  Anesthesia Type:General  Level of Consciousness: awake and drowsy  Airway & Oxygen  Therapy: Patient Spontanous Breathing  Post-op Assessment: Report given to RN  Post vital signs: Reviewed and stable  Last Vitals:  Vitals Value Taken Time  BP 135/77 01/25/24 08:15  Temp    Pulse 84 01/25/24 08:17  Resp 17 01/25/24 08:17  SpO2 100 % 01/25/24 08:17  Vitals shown include unfiled device data.  Last Pain:  Vitals:   01/25/24 0618  TempSrc:   PainSc: 0-No pain         Complications: There were no known notable events for this encounter.

## 2024-01-25 NOTE — Anesthesia Procedure Notes (Signed)
 Procedure Name: LMA Insertion Date/Time: 01/25/2024 7:36 AM  Performed by: Kearney Rosina SAILOR, RNPre-anesthesia Checklist: Patient identified, Emergency Drugs available, Suction available and Patient being monitored Patient Re-evaluated:Patient Re-evaluated prior to induction Oxygen  Delivery Method: Circle System Utilized Preoxygenation: Pre-oxygenation with 100% oxygen  Induction Type: IV induction Ventilation: Mask ventilation without difficulty LMA: LMA inserted LMA Size: 5.0 Number of attempts: 1 Airway Equipment and Method: Bite block Placement Confirmation: positive ETCO2 Tube secured with: Tape Dental Injury: Teeth and Oropharynx as per pre-operative assessment  Comments: atraumatic

## 2024-01-25 NOTE — Anesthesia Postprocedure Evaluation (Signed)
 Anesthesia Post Note  Patient: Travis [name].  Procedure(s) Performed: BURSECTOMY, KNEE (Right: Knee)     Patient location during evaluation: PACU Anesthesia Type: General Level of consciousness: awake and alert Pain management: pain level controlled Vital Signs Assessment: post-procedure vital signs reviewed and stable Respiratory status: spontaneous breathing, nonlabored ventilation, respiratory function stable and patient connected to nasal cannula oxygen  Cardiovascular status: blood pressure returned to baseline and stable Postop Assessment: no apparent nausea or vomiting Anesthetic complications: no   There were no known notable events for this encounter.  Last Vitals:  Vitals:   01/25/24 0830 01/25/24 0845  BP: 132/70 (!) 144/70  Pulse: 78 77  Resp: 13 14  Temp:    SpO2: 98% 98%    Last Pain:  Vitals:   01/25/24 0815  TempSrc:   PainSc: 10-Worst pain ever                 [name]

## 2024-01-25 NOTE — Interval H&P Note (Signed)
 History and Physical Interval Note:  01/25/2024 6:31 AM  Travis [name] [place] Sr.  has presented today for surgery, with the diagnosis of Pre-patella Infected Bursa Right Knee.  The various methods of treatment have been discussed with the patient and family. After consideration of risks, benefits and other options for treatment, the patient has consented to  Procedure(s): BURSECTOMY, KNEE (Right) as a surgical intervention.  The patient's history has been reviewed, patient examined, no change in status, stable for surgery.  I have reviewed the patient's chart and labs.  Questions were answered to the patient's satisfaction.     [name] [name]

## 2024-01-26 DIAGNOSIS — K219 Gastro-esophageal reflux disease without esophagitis: Secondary | ICD-10-CM | POA: Diagnosis present

## 2024-01-26 DIAGNOSIS — Z9884 Bariatric surgery status: Secondary | ICD-10-CM | POA: Diagnosis not present

## 2024-01-26 DIAGNOSIS — E1169 Type 2 diabetes mellitus with other specified complication: Secondary | ICD-10-CM | POA: Diagnosis not present

## 2024-01-26 DIAGNOSIS — I5032 Chronic diastolic (congestive) heart failure: Secondary | ICD-10-CM | POA: Diagnosis present

## 2024-01-26 DIAGNOSIS — Z833 Family history of diabetes mellitus: Secondary | ICD-10-CM | POA: Diagnosis not present

## 2024-01-26 DIAGNOSIS — M797 Fibromyalgia: Secondary | ICD-10-CM | POA: Diagnosis present

## 2024-01-26 DIAGNOSIS — J9611 Chronic respiratory failure with hypoxia: Secondary | ICD-10-CM | POA: Diagnosis present

## 2024-01-26 DIAGNOSIS — Z7985 Long-term (current) use of injectable non-insulin antidiabetic drugs: Secondary | ICD-10-CM | POA: Diagnosis not present

## 2024-01-26 DIAGNOSIS — Z96612 Presence of left artificial shoulder joint: Secondary | ICD-10-CM | POA: Diagnosis present

## 2024-01-26 DIAGNOSIS — M71161 Other infective bursitis, right knee: Secondary | ICD-10-CM | POA: Diagnosis present

## 2024-01-26 DIAGNOSIS — Z96643 Presence of artificial hip joint, bilateral: Secondary | ICD-10-CM | POA: Diagnosis present

## 2024-01-26 DIAGNOSIS — G894 Chronic pain syndrome: Secondary | ICD-10-CM | POA: Diagnosis present

## 2024-01-26 DIAGNOSIS — M71061 Abscess of bursa, right knee: Secondary | ICD-10-CM | POA: Diagnosis present

## 2024-01-26 DIAGNOSIS — G928 Other toxic encephalopathy: Secondary | ICD-10-CM | POA: Diagnosis not present

## 2024-01-26 DIAGNOSIS — M7041 Prepatellar bursitis, right knee: Secondary | ICD-10-CM | POA: Diagnosis not present

## 2024-01-26 DIAGNOSIS — Z8249 Family history of ischemic heart disease and other diseases of the circulatory system: Secondary | ICD-10-CM | POA: Diagnosis not present

## 2024-01-26 DIAGNOSIS — I2510 Atherosclerotic heart disease of native coronary artery without angina pectoris: Secondary | ICD-10-CM | POA: Diagnosis present

## 2024-01-26 DIAGNOSIS — Z89512 Acquired absence of left leg below knee: Secondary | ICD-10-CM | POA: Diagnosis not present

## 2024-01-26 DIAGNOSIS — Z981 Arthrodesis status: Secondary | ICD-10-CM | POA: Diagnosis not present

## 2024-01-26 DIAGNOSIS — Z87891 Personal history of nicotine dependence: Secondary | ICD-10-CM | POA: Diagnosis not present

## 2024-01-26 DIAGNOSIS — K8689 Other specified diseases of pancreas: Secondary | ICD-10-CM | POA: Diagnosis present

## 2024-01-26 DIAGNOSIS — Z955 Presence of coronary angioplasty implant and graft: Secondary | ICD-10-CM | POA: Diagnosis not present

## 2024-01-26 DIAGNOSIS — N400 Enlarged prostate without lower urinary tract symptoms: Secondary | ICD-10-CM | POA: Diagnosis present

## 2024-01-26 DIAGNOSIS — Z96653 Presence of artificial knee joint, bilateral: Secondary | ICD-10-CM | POA: Diagnosis present

## 2024-01-26 DIAGNOSIS — E785 Hyperlipidemia, unspecified: Secondary | ICD-10-CM | POA: Diagnosis present

## 2024-01-26 DIAGNOSIS — Z89511 Acquired absence of right leg below knee: Secondary | ICD-10-CM | POA: Diagnosis not present

## 2024-01-26 DIAGNOSIS — E1151 Type 2 diabetes mellitus with diabetic peripheral angiopathy without gangrene: Secondary | ICD-10-CM | POA: Diagnosis present

## 2024-01-26 LAB — GLUCOSE, CAPILLARY
Glucose-Capillary: 98 mg/dL (ref 70–99)
Glucose-Capillary: 110 mg/dL — ABNORMAL HIGH (ref 70–99)
Glucose-Capillary: 144 mg/dL — ABNORMAL HIGH (ref 70–99)
Glucose-Capillary: 105 mg/dL — ABNORMAL HIGH (ref 70–99)

## 2024-01-26 NOTE — Evaluation (Signed)
 Physical Therapy Evaluation Patient Details Name: Travis [name] Sr. MRN: [id number] DOB: 12-12-1950 Today's Date: 01/26/2024  History of Present Illness  Keygan Dumond. is a 73 y.o. male admitted on 01/25/24 for R knee infection, s/p excision of a infected prepatellar bursa right knee. PMH significant for BPH, Left reverse total shoulder arthroplasty 2022, L THA 2021, s/p BKA 2019, lumbar laminectomy 2020, PAD, B TKA 2017.  Clinical Impression  PTA, patient lived alone in one level home with ramped entrance, independent with ADLs and modI for ambulation with walker vs SPC. Due to NWB status of R LE, patient is unable to use R prosthetic at this time. Patient presents with poor seated balance, B LE weakness, R LE wound vac, and impaired functional mobility. Patient currently requires modA for bed mobility and maxA for sit to stand transfers at [facility]. Unable to participate in chair transfers secondary to poor standing tolerance. Patient will continue to benefit from skilled acute PT services. Recommend post-acute rehab < 3 hours/day at this time. If WB status is upgraded, patient may be good candidate for > 3 hours/day.         If plan is discharge home, recommend the following: A lot of help with bathing/dressing/bathroom;A lot of help with walking and/or transfers;Assistance with cooking/housework;Assist for transportation;Help with stairs or ramp for entrance   Can travel by private vehicle   No    Equipment Recommendations Other (comment) (Will defer to next level of care)  Recommendations for Other Services       Functional Status Assessment Patient has had a recent decline in their functional status and demonstrates the ability to make significant improvements in function in a reasonable and predictable amount of time.     Precautions / Restrictions Precautions Precautions: Fall Recall of Precautions/Restrictions: Intact Precaution/Restrictions Comments: R BKA with wound  vac Restrictions Weight Bearing Restrictions Per Provider Order: Yes RLE Weight Bearing Per Provider Order: Non weight bearing      Mobility  Bed Mobility Overal bed mobility: Needs Assistance Bed Mobility: Supine to Sit, Sit to Supine     Supine to sit: Mod assist, Used rails     General bed mobility comments: Moderate cues for safe sequencing, poor trunk stability.    Transfers Overall transfer level: Needs assistance Equipment used: Rolling walker (2 wheels) Transfers: Sit to/from Stand Sit to Stand: Max assist           General transfer comment: maxA x1 to achieve fully standing upright position with B UE supported on 2WW. Completed x 12 reps total, able to initiate movement well.    Ambulation/Gait               General Gait Details: Unable to tolerate gait, unable to bear weight through R LE therefore unable to use prosthesis  Stairs            Wheelchair Mobility     Tilt Bed    Modified Rankin (Stroke Patients Only)       Balance Overall balance assessment: Needs assistance Sitting-balance support: Bilateral upper extremity supported Sitting balance-Leahy Scale: Poor Sitting balance - Comments: Poor ability to maintain seated balance initially, frequently losing balance posterior. Performs best when using anterior support to pull. Initially with modA at EOB but progressing to CGA with unilateral UE support. Postural control: Posterior lean Standing balance support: Bilateral upper extremity supported Standing balance-Leahy Scale: Zero Standing balance comment: maxA for stability in standing position with B UE supported on 2WW. Tolerates <  15 seconds.                             Pertinent Vitals/Pain Pain Assessment Pain Assessment: 0-10 Pain Score: 5  Pain Location: R LE Pain Descriptors / Indicators: Throbbing, Tightness Pain Intervention(s): Monitored during session    Home Living Family/patient expects to be  discharged to:: Private residence Living Arrangements: Alone Available Help at Discharge: Available PRN/intermittently;Friend(s);Family Type of Home: House Home Access: Ramped entrance       Home Layout: One level Home Equipment: Agricultural consultant (2 wheels);Rollator (4 wheels);Cane - single point;Shower seat;Grab bars - toilet;Grab bars - tub/shower      Prior Function Prior Level of Function : Independent/Modified Independent             Mobility Comments: ModI with use of 4WW vs SPC and R LE prosthesis. ADLs Comments: mod indep with ADL/iADLs     Extremity/Trunk Assessment        Lower Extremity Assessment Lower Extremity Assessment: Generalized weakness (L LE 3/5 MMT; R hip 3-/5 MMT, R knee 2+/5 MMT)    Cervical / Trunk Assessment Cervical / Trunk Assessment: Kyphotic  Communication   Communication Communication: No apparent difficulties    Cognition Arousal: Alert Behavior During Therapy: WFL for tasks assessed/performed   PT - Cognitive impairments: No apparent impairments                         Following commands: Intact       Cueing Cueing Techniques: Verbal cues, Gestural cues, Tactile cues     General Comments      Exercises     Assessment/Plan    PT Assessment Patient needs continued PT services  PT Problem List Decreased strength;Decreased range of motion;Decreased knowledge of use of DME;Decreased balance;Decreased safety awareness;Decreased activity tolerance;Decreased knowledge of precautions;Decreased mobility;Cardiopulmonary status limiting activity;Pain       PT Treatment Interventions DME instruction;Gait training;Therapeutic activities;Therapeutic exercise;Patient/family education;Neuromuscular re-education    PT Goals (Current goals can be found in the Care Plan section)  Acute Rehab PT Goals Patient Stated Goal: Patient's goal is to return home. Time For Goal Achievement: 02/09/24 Potential to Achieve Goals: Good     Frequency Min 2X/week     Co-evaluation               AM-PAC PT 6 Clicks Mobility  Outcome Measure Help needed turning from your back to your side while in a flat bed without using bedrails?: A Little Help needed moving from lying on your back to sitting on the side of a flat bed without using bedrails?: A Lot   Help needed standing up from a chair using your arms (e.g., wheelchair or bedside chair)?: Total Help needed to walk in hospital room?: Total Help needed climbing 3-5 steps with a railing? : Total 6 Click Score: 8    End of Session   Activity Tolerance: Patient tolerated treatment well Patient left: with call bell/phone within reach;with bed alarm set   PT Visit Diagnosis: Unsteadiness on feet (R26.81);Difficulty in walking, not elsewhere classified (R26.2);Other abnormalities of gait and mobility (R26.89);Repeated falls (R29.6);Muscle weakness (generalized) (M62.81);History of falling (Z91.81);Pain Pain - Right/Left: Right Pain - part of body: Knee    Time: 8551-8476 PT Time Calculation (min) (ACUTE ONLY): 35 min   Charges:   PT Evaluation $PT Eval Low Complexity: 1 Low PT Treatments $Therapeutic Activity: 23-37 mins  Sherryle [place], PT, DPT [facility] Acute Rehabilitation Office: [phone]   Sherryle VEAR Dalzell 01/26/2024, 3:43 PM

## 2024-01-26 NOTE — Plan of Care (Signed)

## 2024-01-26 NOTE — Progress Notes (Signed)
 Patient ID: Travis FORBES [name]., male   DOB: 10-26-1950, 73 y.o.   MRN: [id number] Patient is postoperative day 1 excision of a infected prepatellar bursa right knee with a right transtibial amputation.  There is minimal drainage in the Prevena pump with a good suction fit.  Tissue cultures are negative at this time.  Anticipate patient can discharge when he is able to put on the prosthesis.  Patient will need inpatient admission as of today he is unsafe for discharge to home.

## 2024-01-27 LAB — GLUCOSE, CAPILLARY
Glucose-Capillary: 98 mg/dL (ref 70–99)
Glucose-Capillary: 79 mg/dL (ref 70–99)
Glucose-Capillary: 92 mg/dL (ref 70–99)
Glucose-Capillary: 104 mg/dL — ABNORMAL HIGH (ref 70–99)

## 2024-01-27 MED ORDER — SENNOSIDES-DOCUSATE SODIUM 8.6-50 MG PO TABS
1.0000 | ORAL_TABLET | Freq: Every evening | ORAL | Status: DC | PRN
  Administered 2024-01-27: 1 via ORAL
  Filled 2024-01-27: qty 1

## 2024-01-27 MED ORDER — POLYETHYLENE GLYCOL 3350 17 G PO PACK
17.0000 g | PACK | Freq: Every day | ORAL | Status: DC
  Administered 2024-01-27 – 2024-01-31 (×4): 17 g via ORAL
  Filled 2024-01-27 (×7): qty 1

## 2024-01-27 NOTE — Plan of Care (Signed)

## 2024-01-27 NOTE — Progress Notes (Signed)
 Subjective: 2 Days Post-Op Procedure(s) (LRB): BURSECTOMY, KNEE (Right) Patient reports pain as moderate reports stinging at incision site. Slow progress with PT due to poor balance secondary to the fact cannot tolerate prosthesis .   Objective: Vital signs in last 24 hours: Temp:  [97.6 F (36.4 C)-98.5 F (36.9 C)] 97.9 F (36.6 C) (09/07 0730) Pulse Rate:  [51-61] 51 (09/07 0730) Resp:  [15-17] 17 (09/07 0730) BP: (120-148)/(54-76) 148/76 (09/07 0730) SpO2:  [97 %-100 %] 100 % (09/07 0730)  Intake/Output from previous day: No intake/output data recorded. Intake/Output this shift: No intake/output data recorded.  Recent Labs    01/25/24 0612 01/25/24 1244  HGB 10.5* 10.0*   Recent Labs    01/25/24 0612 01/25/24 1244  WBC 2.3* 1.5*  RBC 3.58* 3.43*  HCT 32.4* 30.6*  PLT 181 187   Recent Labs    01/25/24 0612 01/25/24 1244  NA 143  --   K 4.0  --   CL 108  --   CO2 23  --   BUN 9  --   CREATININE 0.99 0.96  GLUCOSE 92  --   CALCIUM  8.7*  --    Culture:FEW DIPHTHEROIDS(CORYNEBACTERIUM SPECIES)  FEW STAPHYLOCOCCUS HAEMOLYTICUS  Susceptibility for DIPHTHEROID pending  No results for input(s): LABPT, INR in the last 72 hours.  Wound vac in place no drainage currently . 150cc yesterday   Assessment/Plan: 2 Days Post-Op Procedure(s) (LRB): BURSECTOMY, KNEE (Right) Up with therapy Plan discharge to home when patient is able to ambulate . Lives alone.  Susceptibility pending on cultures.  Vitals stable.      Bacilio Abascal 01/27/2024, 10:25 AM

## 2024-01-27 NOTE — Plan of Care (Signed)
  Problem: Activity: Goal: Risk for activity intolerance will decrease Outcome: Progressing   Problem: Skin Integrity: Goal: Risk for impaired skin integrity will decrease Outcome: Progressing   Problem: Skin Integrity: Goal: Risk for impaired skin integrity will decrease Outcome: Progressing

## 2024-01-27 NOTE — Plan of Care (Signed)
  Problem: Activity: Goal: Risk for activity intolerance will decrease 01/27/2024 0431 by Grady Heft, RN Outcome: Progressing 01/27/2024 0429 by Grady Heft, RN Outcome: Progressing   Problem: Pain Managment: Goal: General experience of comfort will improve and/or be controlled Outcome: Progressing   Problem: Safety: Goal: Ability to remain free from injury will improve Outcome: Progressing   Problem: Skin Integrity: Goal: Risk for impaired skin integrity will decrease Outcome: Progressing   Problem: Skin Integrity: Goal: Risk for impaired skin integrity will decrease Outcome: Progressing

## 2024-01-27 NOTE — TOC Progression Note (Signed)
 Transition of Care (TOC) - Progression Note    Patient Details  Name: Travis ZIRBES Sr. MRN: [id number] Date of Birth: [birth year]  [facility] CM/SW Contact  Willis Holquin A [place], LCSW Phone Number: 01/27/2024, 11:58 AM  Clinical Narrative:     CSW spoke with pt about regarding recommendation for SNF.  Pt stated, once I put my prosthetic on, I wanna go home, don't wanna go to rehab. Pt declined SNF at discharge, but said if he is unable to ambulate as needed, he would go to rehab if absolutely necessary. He said that he has someone come to help him at home with his meals and cleaning his home as well and would be fine with continuing home health services with [facility].  CSW also contacted pt's son Paden, Senger., and informed him of pt's preference for home and declining SNF, he said he would follow up with pt and assist as able since he lives out of state in [place], [place] .  Plan continued for home with home health. Inpatient Case Management will continue to follow.    Expected Discharge Plan: Home w Home Health Services Barriers to Discharge: Continued Medical Work up               Expected Discharge Plan and Services     Post Acute Care Choice: Resumption of Svcs/PTA Provider, Home Health                             HH Arranged: RN, PT, OT [facility] Agency: [facility] Date [facility] Agency Contacted: 01/25/24 Oscar contacted CM)   Representative spoke with at [facility] Agency: Daphne   Social Drivers of Health (SDOH) Interventions SDOH Screenings   Food Insecurity: No Food Insecurity (01/25/2024)  Housing: Low Risk  (01/25/2024)  Transportation Needs: No Transportation Needs (01/25/2024)  Utilities: Not At Risk (01/25/2024)  Depression (PHQ2-9): Medium Risk (08/15/2023)  Financial Resource Strain: High Risk (10/10/2022)   Received from [facility]  Physical Activity: Unknown (10/10/2022)   Received from [facility]  Social Connections: Socially  Isolated (01/25/2024)  Stress: Stress Concern Present (10/10/2022)   Received from [facility]  Tobacco Use: Medium Risk (01/25/2024)    Readmission Risk Interventions     No data to display

## 2024-01-28 ENCOUNTER — Other Ambulatory Visit: Payer: Self-pay | Admitting: Cardiovascular Disease

## 2024-01-28 ENCOUNTER — Other Ambulatory Visit (HOSPITAL_COMMUNITY): Payer: Self-pay

## 2024-01-28 ENCOUNTER — Encounter (HOSPITAL_COMMUNITY): Payer: Self-pay | Admitting: Orthopedic Surgery

## 2024-01-28 ENCOUNTER — Telehealth (HOSPITAL_COMMUNITY): Payer: Self-pay | Admitting: Pharmacy Technician

## 2024-01-28 LAB — GLUCOSE, CAPILLARY
Glucose-Capillary: 94 mg/dL (ref 70–99)
Glucose-Capillary: 110 mg/dL — ABNORMAL HIGH (ref 70–99)
Glucose-Capillary: 119 mg/dL — ABNORMAL HIGH (ref 70–99)
Glucose-Capillary: 100 mg/dL — ABNORMAL HIGH (ref 70–99)

## 2024-01-28 MED ORDER — LINEZOLID 600 MG/300ML IV SOLN
600.0000 mg | Freq: Two times a day (BID) | INTRAVENOUS | Status: DC
  Administered 2024-01-28 – 2024-01-29 (×3): 600 mg via INTRAVENOUS
  Filled 2024-01-28 (×3): qty 300

## 2024-01-28 MED ORDER — BISACODYL 5 MG PO TBEC
5.0000 mg | DELAYED_RELEASE_TABLET | Freq: Every day | ORAL | Status: DC | PRN
  Administered 2024-01-28: 5 mg via ORAL
  Filled 2024-01-28: qty 1

## 2024-01-28 NOTE — Progress Notes (Signed)
 [facility]    BURSECTOMY, KNEE (Right)  Cultures:  FEW DIPHTHEROIDS(CORYNEBACTERIUM SPECIES)  FEW STAPHYLOCOCCUS HAEMOLYTICUS  Susceptibility for DIPHTHEROID DAY 3   pending final will start zyovox 600 mg iv q12h recommendation per [facility] Pharm[facility]   Vac to suction minimal drainage Prevena Pending final culture  Maurilio Deland Collet PA-C [phone]

## 2024-01-28 NOTE — Telephone Encounter (Signed)
 Patient Product/process development scientist completed.    The patient is insured through [employer]. Patient has Medicare and is not eligible for a copay card, but may be able to apply for patient assistance or Medicare RX Payment Plan (Patient Must reach out to their plan, if eligible for payment plan), if available.    Ran test claim for linezolid  600 mg and the current 14 day co-pay is $0.00.   This test claim was processed through [facility]- copay amounts may vary at other pharmacies due to pharmacy/plan contracts, or as the patient moves through the different stages of their insurance plan.     Reyes Sharps, CPHT Pharmacy Technician III Certified Patient Advocate [facility] Patient Advocate Team Direct Number: [phone]  Fax: [phone]

## 2024-01-28 NOTE — Plan of Care (Signed)
  Problem: Pain Managment: Goal: General experience of comfort will improve and/or be controlled Outcome: Progressing   Problem: Safety: Goal: Ability to remain free from injury will improve Outcome: Progressing   Problem: Skin Integrity: Goal: Risk for impaired skin integrity will decrease Outcome: Progressing

## 2024-01-28 NOTE — Plan of Care (Signed)
  Problem: Elimination: Goal: Will not experience complications related to bowel motility Outcome: Progressing Goal: Will not experience complications related to urinary retention Outcome: Progressing   Problem: Safety: Goal: Ability to remain free from injury will improve Outcome: Progressing   Problem: Activity: Goal: Risk for activity intolerance will decrease Outcome: Not Progressing

## 2024-01-28 NOTE — Progress Notes (Signed)
 Physical Therapy Treatment Patient Details Name: Travis FICK Sr. MRN: [id number] DOB: 30-Aug-1950 Today's Date: 01/28/2024   History of Present Illness Travis [name]. is a 73 y.o. male admitted on 01/25/24 for R knee infection, s/p excision of a infected prepatellar bursa right knee. PMH significant for BPH, Left reverse total shoulder arthroplasty 2022, L THA 2021, s/p BKA 2019, lumbar laminectomy 2020, PAD, B TKA 2017.    PT Comments  Patient progressing slowly.  Had better sitting balance at EOB than last session, though limited standing due to R shoulder pain pt relates needs shoulder replacement.  Performed lateral scoot transfer to get to chair (drop arm recliner) though needing increased time, multiple short scoots with mod A to get fully in recliner.  Patient remains most appropriate for post acute inpatient rehab (<3 hours/say) pending ability to don his prosthetic.    If plan is discharge home, recommend the following: A lot of help with bathing/dressing/bathroom;A lot of help with walking and/or transfers;Assistance with cooking/housework;Assist for transportation;Help with stairs or ramp for entrance   Can travel by private vehicle        Equipment Recommendations  Other (comment) (TBA)    Recommendations for Other Services       Precautions / Restrictions Precautions Precautions: Fall Recall of Precautions/Restrictions: Intact Precaution/Restrictions Comments: R BKA with wound vac Restrictions Other Position/Activity Restrictions: cannot wear R prosthetic till wound vac off     Mobility  Bed Mobility Overal bed mobility: Needs Assistance Bed Mobility: Supine to Sit     Supine to sit: Min assist, HOB elevated, Used rails     General bed mobility comments: increased time, use rail with L UE, pulled with HHA on R side to lift trunk and scoot to EOB    Transfers Overall transfer level: Needs assistance   Transfers: Bed to chair/wheelchair/BSC             Lateral/Scoot Transfers: Mod assist General transfer comment: performed scooting to get to drop arm recliner due to bed soiled with urine.  mod A with pt using UE's and L LE though with limited hip clearance and multiple scoots to get fully into recliner    Ambulation/Gait                   Stairs             Wheelchair Mobility     Tilt Bed    Modified Rankin (Stroke Patients Only)       Balance Overall balance assessment: Needs assistance   Sitting balance-Leahy Scale: Poor Sitting balance - Comments: reliant on UE support on EOB for balance, but sitting with S only Postural control: Posterior lean                                  Communication Communication Communication: No apparent difficulties  Cognition Arousal: Alert Behavior During Therapy: WFL for tasks assessed/performed   PT - Cognitive impairments: Problem solving, Attention, Safety/Judgement                       PT - Cognition Comments: slow processing, not aware had soiled bed with urine Following commands: Intact      Cueing Cueing Techniques: Verbal cues  Exercises      General Comments General comments (skin integrity, edema, etc.): RN aware soiled bed and need for encouraging urinal  Pertinent Vitals/Pain Pain Assessment Pain Assessment: Faces Faces Pain Scale: Hurts even more Pain Location: R shoulder at rest, R LE with mobility Pain Descriptors / Indicators: Aching, Tender Pain Intervention(s): Monitored during session, Repositioned, Limited activity within patient's tolerance    Home Living                          Prior Function            PT Goals (current goals can now be found in the care plan section) Progress towards PT goals: Progressing toward goals    Frequency    Min 2X/week      PT Plan      Co-evaluation              AM-PAC PT 6 Clicks Mobility   Outcome Measure  Help needed turning from  your back to your side while in a flat bed without using bedrails?: A Little Help needed moving from lying on your back to sitting on the side of a flat bed without using bedrails?: A Lot Help needed moving to and from a bed to a chair (including a wheelchair)?: A Lot Help needed standing up from a chair using your arms (e.g., wheelchair or bedside chair)?: Total Help needed to walk in hospital room?: Total Help needed climbing 3-5 steps with a railing? : Total 6 Click Score: 10    End of Session Equipment Utilized During Treatment: Gait belt Activity Tolerance: Patient limited by pain Patient left: in chair;with call bell/phone within reach;with chair alarm set Nurse Communication: Mobility status PT Visit Diagnosis: Other abnormalities of gait and mobility (R26.89);Muscle weakness (generalized) (M62.81);History of falling (Z91.81);Pain Pain - Right/Left: Right Pain - part of body: Knee;Shoulder     Time: 1220-1247 PT Time Calculation (min) (ACUTE ONLY): 27 min  Charges:    $Therapeutic Activity: 23-37 mins PT General Charges $$ ACUTE PT VISIT: 1 Visit                     Micheline [name], PT Acute Rehabilitation Services Office:[phone] 01/28/2024    Travis [name] 01/28/2024, 1:24 PM

## 2024-01-29 LAB — GLUCOSE, CAPILLARY
Glucose-Capillary: 110 mg/dL — ABNORMAL HIGH (ref 70–99)
Glucose-Capillary: 104 mg/dL — ABNORMAL HIGH (ref 70–99)
Glucose-Capillary: 117 mg/dL — ABNORMAL HIGH (ref 70–99)
Glucose-Capillary: 147 mg/dL — ABNORMAL HIGH (ref 70–99)

## 2024-01-29 MED ORDER — LINEZOLID 600 MG PO TABS
600.0000 mg | ORAL_TABLET | Freq: Two times a day (BID) | ORAL | Status: DC
  Administered 2024-01-29 – 2024-02-05 (×14): 600 mg via ORAL
  Filled 2024-01-29 (×15): qty 1

## 2024-01-29 NOTE — Progress Notes (Signed)
 PHARMACIST - PHYSICIAN COMMUNICATION DR:   Harden CONCERNING: Antibiotic IV to Oral Route Change Policy  RECOMMENDATION: This patient is receiving linezolid  by the intravenous route.  Based on criteria approved by the Pharmacy and Therapeutics Committee, the antibiotic(s) is/are being converted to the equivalent oral dose form(s).   DESCRIPTION: These criteria include: Patient being treated for a respiratory tract infection, urinary tract infection, cellulitis or clostridium difficile associated diarrhea if on metronidazole  The patient is not neutropenic and does not exhibit a GI malabsorption state The patient is eating (either orally or via tube) and/or has been taking other orally administered medications for a least 24 hours The patient is improving clinically and has a Tmax < 100.5  If you have questions about this conversion, please contact the Pharmacy Department   [x]   [phone] )  Jolynn Pack   Elma Fail, PharmD PGY1 Clinical Pharmacist [facility]  01/29/2024 11:39 AM

## 2024-01-29 NOTE — Plan of Care (Signed)

## 2024-01-29 NOTE — Evaluation (Signed)
 Occupational Therapy Evaluation Patient Details Name: Travis ANTILLA Sr. MRN: [id number] DOB: [birth year] Today's Date: 01/29/2024   History of Present Illness   Travis Seda. is a 73 y.o. male admitted on 01/25/24 for R knee infection, s/p excision of a infected prepatellar bursa right knee. PMH significant for BPH, Left reverse total shoulder arthroplasty 2022, L THA 2021, s/p R BKA 2019, lumbar laminectomy 2020, PAD, B TKA 2017.     Clinical Impressions PTA, pt lived alone and was mod I for BADL, IADL. Upon eval, pt presents with decreased strength, balance, knowledge of compensatory techniques, and endurance. Pt needing up to set-up for UB ADL and mod-max for LB ADL. Able to perform lateral scoot to chair with min A and cues for technique this session. Pt to continue to benefit from acute OT services. Patient will benefit from continued inpatient follow up therapy, <3 hours/day      If plan is discharge home, recommend the following:   A lot of help with walking and/or transfers;A lot of help with bathing/dressing/bathroom;Assistance with cooking/housework;Assist for transportation;Help with stairs or ramp for entrance     Functional Status Assessment   Patient has had a recent decline in their functional status and demonstrates the ability to make significant improvements in function in a reasonable and predictable amount of time.     Equipment Recommendations   BSC/3in1 (drop arm)     Recommendations for Other Services         Precautions/Restrictions   Precautions Precautions: Fall Recall of Precautions/Restrictions: Intact Precaution/Restrictions Comments: R BKA with wound vac Restrictions Weight Bearing Restrictions Per Provider Order: No RLE Weight Bearing Per Provider Order: Non weight bearing Other Position/Activity Restrictions: cannot wear R prosthetic till wound vac off     Mobility Bed Mobility Overal bed mobility: Needs Assistance Bed  Mobility: Supine to Sit     Supine to sit: Min assist, HOB elevated, Used rails     General bed mobility comments: HHA, increasd time, use of rail    Transfers Overall transfer level: Needs assistance Equipment used: Rolling walker (2 wheels) Transfers: Bed to chair/wheelchair/BSC            Lateral/Scoot Transfers: Min assist General transfer comment: scoot toward L with cues for technique      Balance Overall balance assessment: Needs assistance Sitting-balance support: Bilateral upper extremity supported Sitting balance-Leahy Scale: Poor Sitting balance - Comments: reliant on UE                                   ADL either performed or assessed with clinical judgement   ADL Overall ADL's : Needs assistance/impaired Eating/Feeding: Independent   Grooming: Sitting;Contact guard assist   Upper Body Bathing: Contact guard assist;Sitting   Lower Body Bathing: Moderate assistance;Sitting/lateral leans   Upper Body Dressing : Minimal assistance;Sitting;Contact guard assist   Lower Body Dressing: Maximal assistance;Sitting/lateral leans   Toilet Transfer: Minimal assistance Toilet Transfer Details (indicate cue type and reason): lateral scoot simulated to chair. significantly incr time, cues for anterior weight shift/technique Toileting- Clothing Manipulation and Hygiene: Total assistance;Bed level (rolling in chair)               Vision         Perception Perception: Not tested       Praxis Praxis: Not tested       Pertinent Vitals/Pain Pain Assessment Pain Assessment: Faces Faces Pain  Scale: Hurts a little bit Pain Location: R knee Pain Descriptors / Indicators: Aching, Tender Pain Intervention(s): Limited activity within patient's tolerance, Monitored during session     Extremity/Trunk Assessment Upper Extremity Assessment Upper Extremity Assessment: RUE deficits/detail;LUE deficits/detail RUE Deficits / Details: decr shoulder  ROM, pain LUE Deficits / Details: prior shoulder surgery.   Lower Extremity Assessment Lower Extremity Assessment: Generalized weakness   Cervical / Trunk Assessment Cervical / Trunk Assessment: Kyphotic   Communication Communication Communication: No apparent difficulties   Cognition Arousal: Alert Behavior During Therapy: WFL for tasks assessed/performed Cognition: No apparent impairments             OT - Cognition Comments: cues for safety with novel lateral scoot transfer                 Following commands: Intact       Cueing  General Comments   Cueing Techniques: Verbal cues      Exercises     Shoulder Instructions      Home Living Family/patient expects to be discharged to:: Private residence Living Arrangements: Alone Available Help at Discharge: Available PRN/intermittently;Friend(s);Family (friend very involved) Type of Home: House Home Access: Ramped entrance     Home Layout: One level     Bathroom Shower/Tub: Walk-in shower         Home Equipment: Agricultural consultant (2 wheels);Rollator (4 wheels);Cane - single point;Shower seat;Grab bars - toilet;Grab bars - tub/shower          Prior Functioning/Environment Prior Level of Function : Independent/Modified Independent             Mobility Comments: ModI with use of 4WW vs SPC and R LE prosthesis. ADLs Comments: mod indep with ADL/iADLs    OT Problem List: Decreased strength;Decreased activity tolerance;Impaired balance (sitting and/or standing);Decreased safety awareness;Decreased knowledge of use of DME or AE;Pain   OT Treatment/Interventions: Self-care/ADL training;Therapeutic exercise;DME and/or AE instruction;Therapeutic activities;Patient/family education;Balance training      OT Goals(Current goals can be found in the care plan section)   Acute Rehab OT Goals Patient Stated Goal: get better OT Goal Formulation: With patient Time For Goal Achievement:  02/12/24 Potential to Achieve Goals: Good   OT Frequency:  Min 2X/week    Co-evaluation              AM-PAC OT 6 Clicks Daily Activity     Outcome Measure Help from another person eating meals?: None Help from another person taking care of personal grooming?: A Little Help from another person toileting, which includes using toliet, bedpan, or urinal?: A Lot Help from another person bathing (including washing, rinsing, drying)?: A Lot Help from another person to put on and taking off regular upper body clothing?: A Little Help from another person to put on and taking off regular lower body clothing?: A Lot 6 Click Score: 16   End of Session Nurse Communication: Mobility status  Activity Tolerance: Patient tolerated treatment well Patient left: in chair;with call bell/phone within reach;with chair alarm set  OT Visit Diagnosis: Unsteadiness on feet (R26.81);Muscle weakness (generalized) (M62.81);Pain Pain - Right/Left: Right Pain - part of body: Shoulder                Time: 9043-8967 OT Time Calculation (min): 36 min Charges:  OT General Charges $OT Visit: 1 Visit OT Evaluation $OT Eval Low Complexity: 1 Low OT Treatments $Self Care/Home Management : 8-22 mins  Travis [name], OTR/L [facility] Acute Rehabilitation Office: [phone]  Iaan Oregel D Walton 01/29/2024, 5:48 PM

## 2024-01-29 NOTE — Progress Notes (Signed)
 [facility]  Vac with good suction, no drainage in canister  S/P Bursectomy right knee Pending final cultures Started on Zyovox 600 mg iv q12h recommendation per [facility]   Do not done the prothesis until the knee incision has healed.  Maurilio Deland Collet PA-C

## 2024-01-29 NOTE — Care Management Important Message (Signed)
 Important Message  Patient Details  Name: Travis PAVON Sr. MRN: [id number] Date of Birth: 06-07-1950   Important Message Given:  Yes - Medicare IM     Jon Cruel 01/29/2024, 3:54 PM

## 2024-01-30 ENCOUNTER — Ambulatory Visit: Admitting: Orthopedic Surgery

## 2024-01-30 LAB — GLUCOSE, CAPILLARY
Glucose-Capillary: 103 mg/dL — ABNORMAL HIGH (ref 70–99)
Glucose-Capillary: 118 mg/dL — ABNORMAL HIGH (ref 70–99)
Glucose-Capillary: 98 mg/dL (ref 70–99)
Glucose-Capillary: 145 mg/dL — ABNORMAL HIGH (ref 70–99)

## 2024-01-30 LAB — AEROBIC/ANAEROBIC CULTURE W GRAM STAIN (SURGICAL/DEEP WOUND): Gram Stain: NONE SEEN

## 2024-01-30 NOTE — Progress Notes (Signed)
 Physical Therapy Treatment Patient Details Name: Travis GILCHREST Sr. MRN: [id number] DOB: 05-05-1951 Today's Date: 01/30/2024   History of Present Illness Maximum Reiland. is a 73 y.o. male admitted on 01/25/24 for R knee infection, s/p excision of a infected prepatellar bursa right knee. PMH significant for BPH, Left reverse total shoulder arthroplasty 2022, L THA 2021, s/p R BKA 2019, lumbar laminectomy 2020, PAD, B TKA 2017.    PT Comments  Focused session on trying to progress pt's independence with bed mobility and OOB and standing mobility. The pt still had his wound vac in place, thus was unable to attempt to use his prosthesis today. The pt demonstrated deficits in core, bil UE, and L leg strength through maintaining a flexed and partially twisted to the R posture when standing in the stedy 3x with maxA. He ultimately transferred OOB to the chair via laterally scooting with modA. His R shoulder pain also limits his independence and ease with mobility, resulting in him needing modA for supine <> sit transitions. The pt was unaware he had a BM in bed and seemed unaware of the extent his bed and his gown were saturate in urine. He at high risk for falls and would be unsafe to be home alone at this time. Current recommendations remain appropriate.     If plan is discharge home, recommend the following: A lot of help with bathing/dressing/bathroom;Assistance with cooking/housework;Assist for transportation;Help with stairs or ramp for entrance;Two people to help with walking and/or transfers   Can travel by private vehicle     No  Equipment Recommendations  BSC/3in1;Wheelchair cushion (measurements PT);Wheelchair (measurements PT);Hospital bed (drop-arm [facility] and w/c)    Recommendations for Other Services       Precautions / Restrictions Precautions Precautions: Fall Recall of Precautions/Restrictions: Intact Precaution/Restrictions Comments: R BKA with wound  vac Restrictions Weight Bearing Restrictions Per Provider Order: No Other Position/Activity Restrictions: cannot wear R prosthetic till wound vac off     Mobility  Bed Mobility Overal bed mobility: Needs Assistance Bed Mobility: Supine to Sit, Sit to Supine, Rolling Rolling: Min assist, Used rails   Supine to sit: Mod assist, HOB elevated, Used rails Sit to supine: Mod assist, HOB elevated   General bed mobility comments: Cues needed to scoot shoulders laterally to R to allow L UE enough room to grab L bed rail to pull up to sit L EOB. ModA needed at trunk to sit up and scoot hips to EOB using bed pad, x2 reps. Pt needed modA to lift his legs and pivot his body to return to supine 1x. Cues needed to reach L UE to R rail to pull and roll R 1x, minA.    Transfers Overall transfer level: Needs assistance Equipment used: Ambulation equipment used, None Transfers: Bed to chair/wheelchair/BSC, Sit to/from Stand Sit to Stand: Max assist, From elevated surface          Lateral/Scoot Transfers: Mod assist General transfer comment: Pt stood from elevated EOB to stedy 3x with maxA, needing multi-modal cues to extend L knee and hips, but pt maintained a flexed trunk posture and twisted to the R due to inability to pull efficiently down through his R UE on the stedy bar due to R shoulder pain. Pt ultimately transferred OOB via laterally scooting to L from bed to drop arm recliner with modA and cues for hand placement.    Ambulation/Gait               General  Gait Details: Unable to tolerate gait, unable to use prosthesis and bear weight through R LE until wound vac is off   Stairs             Wheelchair Mobility     Tilt Bed    Modified Rankin (Stroke Patients Only)       Balance Overall balance assessment: Needs assistance Sitting-balance support: Single extremity supported, Bilateral upper extremity supported, Feet supported Sitting balance-Leahy Scale:  Poor Sitting balance - Comments: Pt often leaning posteriorly, relying on L UE support on bed rails to sit up, intermittently needing minA for balance but often sitting statically with CGA Postural control: Posterior lean Standing balance support: Bilateral upper extremity supported Standing balance-Leahy Scale: Zero Standing balance comment: MaxA to stand in stedy with flexed posture.                            Communication Communication Communication: No apparent difficulties  Cognition Arousal: Alert Behavior During Therapy: WFL for tasks assessed/performed   PT - Cognitive impairments: Problem solving, Attention, Safety/Judgement, Awareness, Sequencing                       PT - Cognition Comments: Pt needed cues to sequence and problem solve mobility. Pt unaware he had a BM while in bed. He was aware his urinal spilled some but did not seem fully aware by the extent of how staurated he and the bed were in urine. Following commands: Impaired Following commands impaired: Follows one step commands with increased time    Cueing Cueing Techniques: Verbal cues, Tactile cues  Exercises      General Comments        Pertinent Vitals/Pain Pain Assessment Pain Assessment: 0-10 Pain Score: 9  (in R shoulder, less in R leg) Pain Location: R knee and R shoulder Pain Descriptors / Indicators: Discomfort, Grimacing, Guarding Pain Intervention(s): Limited activity within patient's tolerance, Monitored during session, Repositioned    Home Living                          Prior Function            PT Goals (current goals can now be found in the care plan section) Acute Rehab PT Goals Patient Stated Goal: Patient's goal is to return home. Time For Goal Achievement: 02/09/24 Potential to Achieve Goals: Good Progress towards PT goals: Progressing toward goals    Frequency    Min 2X/week      PT Plan      Co-evaluation               AM-PAC PT 6 Clicks Mobility   Outcome Measure  Help needed turning from your back to your side while in a flat bed without using bedrails?: A Little Help needed moving from lying on your back to sitting on the side of a flat bed without using bedrails?: A Lot Help needed moving to and from a bed to a chair (including a wheelchair)?: A Lot Help needed standing up from a chair using your arms (e.g., wheelchair or bedside chair)?: A Lot Help needed to walk in hospital room?: Total Help needed climbing 3-5 steps with a railing? : Total 6 Click Score: 11    End of Session Equipment Utilized During Treatment: Gait belt Activity Tolerance: Patient tolerated treatment well Patient left: in chair;with call bell/phone within reach;with chair alarm  set Nurse Communication: Mobility status (NT notified of mobility status; notified of sacral skin integrity concerns and application of sacral foam this session) PT Visit Diagnosis: Other abnormalities of gait and mobility (R26.89);Muscle weakness (generalized) (M62.81);History of falling (Z91.81);Pain;Unsteadiness on feet (R26.81);Difficulty in walking, not elsewhere classified (R26.2) Pain - Right/Left: Right Pain - part of body: Knee;Shoulder     Time: 8452-8383 PT Time Calculation (min) (ACUTE ONLY): 29 min  Charges:    $Therapeutic Activity: 23-37 mins PT General Charges $$ ACUTE PT VISIT: 1 Visit                     Theo Ferretti, PT, DPT Acute Rehabilitation Services  Office: [phone]    Theo CHRISTELLA Ferretti 01/30/2024, 5:37 PM

## 2024-01-30 NOTE — Progress Notes (Signed)
 Patient ID: Travis FORBES [name]., male   DOB: Mar 08, 1951, 73 y.o.   MRN: [id number] Patient is status post excisional debridement of infected prepatellar bursa.  Cultures are showing staph sensitive to doxycycline .  No new drainage in the wound VAC canister.  Will have the wound VAC removed dry dressing applied and the stump shrinker.  Patient anticipate discharge to home tomorrow with crutches.

## 2024-01-30 NOTE — Plan of Care (Signed)
  Problem: Education: Goal: Knowledge of General Education information will improve Description: Including pain rating scale, medication(s)/side effects and non-pharmacologic comfort measures Outcome: Progressing   Problem: Activity: Goal: Risk for activity intolerance will decrease Outcome: Progressing   Problem: Pain Managment: Goal: General experience of comfort will improve and/or be controlled Outcome: Progressing

## 2024-01-31 LAB — GLUCOSE, CAPILLARY
Glucose-Capillary: 121 mg/dL — ABNORMAL HIGH (ref 70–99)
Glucose-Capillary: 114 mg/dL — ABNORMAL HIGH (ref 70–99)
Glucose-Capillary: 128 mg/dL — ABNORMAL HIGH (ref 70–99)
Glucose-Capillary: 88 mg/dL (ref 70–99)

## 2024-01-31 LAB — URIC ACID: Uric Acid, Serum: 4.9 mg/dL (ref 3.7–8.6)

## 2024-01-31 NOTE — Progress Notes (Signed)
 Occupational Therapy Treatment Patient Details Name: Travis SCHLAGETER Sr. MRN: [id number] DOB: 12-21-1950 Today's Date: 01/31/2024   History of present illness Sammuel [name]. is a 73 y.o. male admitted on 01/25/24 for R knee infection, s/p excision of a infected prepatellar bursa right knee. PMH significant for BPH, Left reverse total shoulder arthroplasty 2022, L THA 2021, s/p R BKA 2019, lumbar laminectomy 2020, PAD, B TKA 2017.   OT comments  Pt resting in bed, c/o L elbow pain when attempting to sit EOB. Pt has been able to move it in therapy the past two days, did not notice any mention of L elbow pain in notes, but is unable to move LUE today. Severe pain with light touch to distal humerus, no pain at left medial epicondyle, just proximal to it there is significant pain. No apparent discoloration/bruising, repositioned with pillows. PT not able to make a fist, supinate/pronate, difficulty with wrist flexion/extension, painful with all PROM. Pt required set up for feeding, unable to use LUE functionally, self feeds with RUE. MD/RN informed, will continue to follow acutely to progress as able.       If plan is discharge home, recommend the following:  A lot of help with walking and/or transfers;A lot of help with bathing/dressing/bathroom;Assistance with cooking/housework;Assist for transportation;Help with stairs or ramp for entrance   Equipment Recommendations  BSC/3in1;Other (comment) (drop arm)    Recommendations for Other Services      Precautions / Restrictions Precautions Precautions: Fall Recall of Precautions/Restrictions: Intact Precaution/Restrictions Comments: R BKA with wound vac Restrictions Weight Bearing Restrictions Per Provider Order: No RLE Weight Bearing Per Provider Order: Non weight bearing Other Position/Activity Restrictions: cannot wear R prosthetic till wound vac off       Mobility Bed Mobility               General bed mobility comments:  did not attempt due to L elbow pain, unable to perform bed mobility due to pain, even with assist    Transfers                   General transfer comment: declined due to pain at L elbow     Balance                                           ADL either performed or assessed with clinical judgement   ADL Overall ADL's : Needs assistance/impaired Eating/Feeding: Set up;Bed level                                     General ADL Comments: limited due to L elbow pain, set up for feeding, unable to use LUE to participate in feeding    Extremity/Trunk Assessment Upper Extremity Assessment Upper Extremity Assessment: LUE deficits/detail LUE Deficits / Details: prior shoulder surgery, c/o severe elbow painwith PROM/AROM, painful to touch at humerus, motor control and sensation intact but barely able to perform AROM to hand/wrist/elbow without severe pain. LUE: Shoulder pain with ROM LUE Sensation: WNL LUE Coordination: decreased fine motor;decreased gross motor            Vision       Perception     Praxis     Communication Communication Communication: No apparent difficulties   Cognition Arousal: Alert Behavior During  Therapy: WFL for tasks assessed/performed Cognition: No apparent impairments                               Following commands: Intact        Cueing   Cueing Techniques: Verbal cues  Exercises      Shoulder Instructions       General Comments      Pertinent Vitals/ Pain       Pain Assessment Pain Assessment: Faces Faces Pain Scale: Hurts whole lot Pain Location: L elbow Pain Descriptors / Indicators: Discomfort, Grimacing, Guarding, Sharp Pain Intervention(s): Limited activity within patient's tolerance, Monitored during session, Repositioned  Home Living                                          Prior Functioning/Environment              Frequency  Min 2X/week         Progress Toward Goals  OT Goals(current goals can now be found in the care plan section)  Progress towards OT goals: Progressing toward goals  Acute Rehab OT Goals Patient Stated Goal: to manage pain OT Goal Formulation: With patient Time For Goal Achievement: 02/12/24 Potential to Achieve Goals: Good ADL Goals Pt Will Perform Lower Body Dressing: with supervision;sitting/lateral leans Pt Will Transfer to Toilet: with supervision  Plan      Co-evaluation                 AM-PAC OT 6 Clicks Daily Activity     Outcome Measure   Help from another person eating meals?: A Little Help from another person taking care of personal grooming?: A Little Help from another person toileting, which includes using toliet, bedpan, or urinal?: A Lot Help from another person bathing (including washing, rinsing, drying)?: A Lot Help from another person to put on and taking off regular upper body clothing?: A Little Help from another person to put on and taking off regular lower body clothing?: A Lot 6 Click Score: 15    End of Session    OT Visit Diagnosis: Unsteadiness on feet (R26.81);Muscle weakness (generalized) (M62.81);Pain Pain - Right/Left: Right Pain - part of body: Shoulder   Activity Tolerance Patient limited by pain   Patient Left in bed;with call bell/phone within reach   Nurse Communication Mobility status;Other (comment) (informed of L elbow pain)        Time: 8891-8875 OT Time Calculation (min): 16 min  Charges: OT General Charges $OT Visit: 1 Visit OT Treatments $Therapeutic Activity: 8-22 mins  Belva, OTR/L   Elouise JONELLE Bott 01/31/2024, 11:33 AM

## 2024-01-31 NOTE — Plan of Care (Signed)

## 2024-01-31 NOTE — Progress Notes (Signed)
 Patient ID: Travis FORBES [name]., male   DOB: June 05, 1950, 73 y.o.   MRN: [id number] Patient's generalized health condition seems to be deteriorating.  Physical therapy does not feel he is safe for discharge to home.  I have requested social work consultation for evaluation for skilled nursing placement.

## 2024-02-01 ENCOUNTER — Encounter (HOSPITAL_COMMUNITY): Payer: Self-pay | Admitting: Orthopedic Surgery

## 2024-02-01 DIAGNOSIS — G4734 Idiopathic sleep related nonobstructive alveolar hypoventilation: Secondary | ICD-10-CM

## 2024-02-01 DIAGNOSIS — F320 Major depressive disorder, single episode, mild: Secondary | ICD-10-CM

## 2024-02-01 DIAGNOSIS — I10 Essential (primary) hypertension: Secondary | ICD-10-CM

## 2024-02-01 DIAGNOSIS — K8689 Other specified diseases of pancreas: Secondary | ICD-10-CM

## 2024-02-01 DIAGNOSIS — I2510 Atherosclerotic heart disease of native coronary artery without angina pectoris: Secondary | ICD-10-CM

## 2024-02-01 DIAGNOSIS — E1169 Type 2 diabetes mellitus with other specified complication: Secondary | ICD-10-CM

## 2024-02-01 DIAGNOSIS — E66811 Obesity, class 1: Secondary | ICD-10-CM

## 2024-02-01 DIAGNOSIS — Z6834 Body mass index (BMI) 34.0-34.9, adult: Secondary | ICD-10-CM

## 2024-02-01 DIAGNOSIS — K219 Gastro-esophageal reflux disease without esophagitis: Secondary | ICD-10-CM

## 2024-02-01 DIAGNOSIS — E782 Mixed hyperlipidemia: Secondary | ICD-10-CM

## 2024-02-01 DIAGNOSIS — N401 Enlarged prostate with lower urinary tract symptoms: Secondary | ICD-10-CM

## 2024-02-01 DIAGNOSIS — G4733 Obstructive sleep apnea (adult) (pediatric): Secondary | ICD-10-CM

## 2024-02-01 DIAGNOSIS — R350 Frequency of micturition: Secondary | ICD-10-CM

## 2024-02-01 DIAGNOSIS — Z89511 Acquired absence of right leg below knee: Secondary | ICD-10-CM | POA: Diagnosis not present

## 2024-02-01 DIAGNOSIS — M7041 Prepatellar bursitis, right knee: Secondary | ICD-10-CM | POA: Diagnosis not present

## 2024-02-01 DIAGNOSIS — I152 Hypertension secondary to endocrine disorders: Secondary | ICD-10-CM

## 2024-02-01 DIAGNOSIS — E669 Obesity, unspecified: Secondary | ICD-10-CM

## 2024-02-01 DIAGNOSIS — G894 Chronic pain syndrome: Secondary | ICD-10-CM

## 2024-02-01 DIAGNOSIS — D649 Anemia, unspecified: Secondary | ICD-10-CM

## 2024-02-01 DIAGNOSIS — F419 Anxiety disorder, unspecified: Secondary | ICD-10-CM

## 2024-02-01 DIAGNOSIS — I739 Peripheral vascular disease, unspecified: Secondary | ICD-10-CM

## 2024-02-01 DIAGNOSIS — E1159 Type 2 diabetes mellitus with other circulatory complications: Secondary | ICD-10-CM

## 2024-02-01 LAB — GLUCOSE, CAPILLARY
Glucose-Capillary: 117 mg/dL — ABNORMAL HIGH (ref 70–99)
Glucose-Capillary: 113 mg/dL — ABNORMAL HIGH (ref 70–99)
Glucose-Capillary: 145 mg/dL — ABNORMAL HIGH (ref 70–99)
Glucose-Capillary: 143 mg/dL — ABNORMAL HIGH (ref 70–99)

## 2024-02-01 LAB — CBC
HCT: 27.7 % — ABNORMAL LOW (ref 39.0–52.0)
Hemoglobin: 9 g/dL — ABNORMAL LOW (ref 13.0–17.0)
MCH: 28.6 pg (ref 26.0–34.0)
MCHC: 32.5 g/dL (ref 30.0–36.0)
MCV: 87.9 fL (ref 80.0–100.0)
Platelets: 176 K/uL (ref 150–400)
RBC: 3.15 MIL/uL — ABNORMAL LOW (ref 4.22–5.81)
RDW: 13.6 % (ref 11.5–15.5)
WBC: 4 K/uL (ref 4.0–10.5)
nRBC: 0 % (ref 0.0–0.2)

## 2024-02-01 LAB — COMPREHENSIVE METABOLIC PANEL WITH GFR
ALT: 17 U/L (ref 0–44)
AST: 28 U/L (ref 15–41)
Albumin: 2.6 g/dL — ABNORMAL LOW (ref 3.5–5.0)
Alkaline Phosphatase: 75 U/L (ref 38–126)
Anion gap: 15 (ref 5–15)
BUN: 20 mg/dL (ref 8–23)
CO2: 22 mmol/L (ref 22–32)
Calcium: 8.3 mg/dL — ABNORMAL LOW (ref 8.9–10.3)
Chloride: 96 mmol/L — ABNORMAL LOW (ref 98–111)
Creatinine, Ser: 0.95 mg/dL (ref 0.61–1.24)
GFR, Estimated: >60 mL/min (ref >60)
Glucose, Bld: 163 mg/dL — ABNORMAL HIGH (ref 70–99)
Potassium: 4.3 mmol/L (ref 3.5–5.1)
Sodium: 133 mmol/L — ABNORMAL LOW (ref 135–145)
Total Bilirubin: 0.9 mg/dL (ref 0.0–1.2)
Total Protein: 5.4 g/dL — ABNORMAL LOW (ref 6.5–8.1)

## 2024-02-01 LAB — CREATININE, SERUM
Creatinine, Ser: 0.86 mg/dL (ref 0.61–1.24)
GFR, Estimated: >60 mL/min

## 2024-02-01 LAB — TSH: TSH: 2.61 u[IU]/mL (ref 0.350–4.500)

## 2024-02-01 MED ORDER — OXYCODONE HCL 5 MG PO TABS
10.0000 mg | ORAL_TABLET | ORAL | Status: DC | PRN
  Administered 2024-02-02 – 2024-02-05 (×8): 10 mg via ORAL
  Filled 2024-02-01 (×9): qty 2

## 2024-02-01 MED ORDER — COLCHICINE 0.6 MG PO TABS
0.6000 mg | ORAL_TABLET | Freq: Every day | ORAL | Status: DC
  Administered 2024-02-01 – 2024-02-05 (×5): 0.6 mg via ORAL
  Filled 2024-02-01 (×5): qty 1

## 2024-02-01 MED ORDER — HYDROMORPHONE HCL 1 MG/ML IJ SOLN: 0.5000 mg | INTRAMUSCULAR | Status: DC | PRN

## 2024-02-01 MED ORDER — TESTOSTERONE 50 MG/5GM (1%) TD GEL
5.0000 g | Freq: Every day | TRANSDERMAL | Status: DC
  Administered 2024-02-02 – 2024-02-05 (×4): 5 g via TRANSDERMAL
  Filled 2024-02-01 (×4): qty 5

## 2024-02-01 NOTE — Progress Notes (Signed)
 Patient ID: Travis [name] [name]., male   DOB: 1950-07-31, 73 y.o.   MRN: [id number] Patient is status post excision infected prepatellar bursa.  Patient is slow to progress with physical therapy and patient is not felt to be safe to go home with physical therapy.  Patient had acute pain and swelling in the left shoulder left elbow left hand and wrist.  His uric acid is 4.9.  Acute symptoms possibly secondary to acute gout.  Will start colchicine .  Will consult hospitalist.
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
## 2024-02-01 NOTE — Plan of Care (Signed)

## 2024-02-01 NOTE — Progress Notes (Signed)
 Physical Therapy Treatment Patient Details Name: Travis CONDE Sr. MRN: [id number] DOB: 06-06-1950 Today's Date: 02/01/2024   History of Present Illness Travis Schrom. is a 73 y.o. male admitted on 01/25/24 for R knee infection, s/p excision of a infected prepatellar bursa right knee. PMH significant for BPH, Left reverse total shoulder arthroplasty 2022, L THA 2021, s/p R BKA 2019, lumbar laminectomy 2020, PAD, B TKA 2017.    PT Comments  Pt required increased physical assist with all functional mobility. He is limited by LUE pain, impaired balance, and decreased activity tolerance. Attempted to don R prosthesis as pt no longer has wound vac, but was unable to d/t swelling. He demonstrated a posterior and right lateral lean requiring modA to maintain posture seated EOB. Pt stood using RW with modA x2. He was unable to pivot on foot or maneuver RW. Brought in stedy to complete bed>chair transfer. Pt quickly fatiguing requiring increased assist to stand and demonstrating a right lateral lean in stedy. Cued upright posture and provided assist to stabilize. Pt had BM while standing in stedy. Placed lift pad in chair and utilized maximove to bring pt back to bed. NT entered at end of session to complete pericare. Patient will benefit from continued inpatient follow up therapy, <3 hours/day.    If plan is discharge home, recommend the following: Two people to help with walking and/or transfers;Two people to help with bathing/dressing/bathroom;Assistance with cooking/housework;Assist for transportation;Help with stairs or ramp for entrance   Can travel by private vehicle     No  Equipment Recommendations  BSC/3in1;Wheelchair cushion (measurements PT);Wheelchair (measurements PT);Hospital bed (drop-arm)    Recommendations for Other Services       Precautions / Restrictions Precautions Precautions: Fall Recall of Precautions/Restrictions: Intact Restrictions Weight Bearing Restrictions  Per Provider Order: Yes RLE Weight Bearing Per Provider Order: Weight bearing as tolerated (in prosthesis)     Mobility  Bed Mobility Overal bed mobility: Needs Assistance Bed Mobility: Supine to Sit, Rolling Rolling: Mod assist, +2 for physical assistance, Used rails   Supine to sit: Mod assist, +2 for physical assistance, HOB elevated     General bed mobility comments: Pt sat up on L side of bed with increase time. Cues for sequencing. Assist to bring hand onto bedrail. Use of bed pad to pivot pt and bring him to EOB. Rolled R/L to remove lift pad from underneath pt. NT entered to address pt's pericare needs following BM.    Transfers Overall transfer level: Needs assistance Equipment used: Rolling walker (2 wheels), Ambulation equipment used Transfers: Sit to/from Stand, Bed to chair/wheelchair/BSC Sit to Stand: From elevated surface, Mod assist, +2 physical assistance, Via lift equipment, Max assist           General transfer comment: Pt stood from raised bed height. Assist to placed LUE onto RW grip. Powered up with modA x2. Pt unable to pivot on L foot. Brought in stedy. Assist to placed BUE support on front grab bar. Powered up with modA x2 and use of bed pad to faciliate hips fwd in order to place seat flaps underneath pt. Transferred to recliner chair. Pt had BM while standing. He began to quickly fatigue and demonstrated excessive right lateral lean. Stood with maxA x2 and eased into recliner with lift pad underneath him. Maximoved back to bed in order for pericare to be addressed by NT.    Ambulation/Gait               General  Gait Details: Unable - Attempted to don R prosthesis but didn't fit d/t swelling.   Stairs             Wheelchair Mobility     Tilt Bed    Modified Rankin (Stroke Patients Only)       Balance Overall balance assessment: Needs assistance Sitting-balance support: Single extremity supported, Feet supported Sitting  balance-Leahy Scale: Poor Sitting balance - Comments: Pt demonstrated a posterior and right lateral lean requiring modA. He had brief periods of maintaining upright posture with CGA and RUE support on bed. Pt was unable to utilize LUE d/t pain. Postural control: Posterior lean, Right lateral lean Standing balance support: Bilateral upper extremity supported Standing balance-Leahy Scale: Zero Standing balance comment: Pt dependent on stedy.                            Communication Communication Communication: No apparent difficulties  Cognition Arousal: Alert Behavior During Therapy: WFL for tasks assessed/performed   PT - Cognitive impairments: Problem solving, Attention, Safety/Judgement, Awareness, Sequencing                       PT - Cognition Comments: Mod-Max cues for sequencing. Encouragement to participate to the fullest of his capabilities. Following commands: Impaired Following commands impaired: Follows one step commands with increased time    Cueing Cueing Techniques: Verbal cues, Gestural cues, Tactile cues  Exercises      General Comments General comments (skin integrity, edema, etc.): NT and RN present at end of session to complete pericare d/t pt's BM      Pertinent Vitals/Pain Pain Assessment Pain Assessment: Faces Faces Pain Scale: Hurts even more Pain Location: L elbow and hand Pain Descriptors / Indicators: Grimacing, Discomfort, Aching, Sore, Tender Pain Intervention(s): Monitored during session, Limited activity within patient's tolerance, Repositioned    Home Living                          Prior Function            PT Goals (current goals can now be found in the care plan section) Acute Rehab PT Goals Patient Stated Goal: Regain independence Progress towards PT goals: Not progressing toward goals - comment    Frequency    Min 2X/week      PT Plan      Co-evaluation              AM-PAC PT 6  Clicks Mobility   Outcome Measure  Help needed turning from your back to your side while in a flat bed without using bedrails?: Total Help needed moving from lying on your back to sitting on the side of a flat bed without using bedrails?: Total Help needed moving to and from a bed to a chair (including a wheelchair)?: Total Help needed standing up from a chair using your arms (e.g., wheelchair or bedside chair)?: Total Help needed to walk in hospital room?: Total Help needed climbing 3-5 steps with a railing? : Total 6 Click Score: 6    End of Session Equipment Utilized During Treatment: Gait belt Activity Tolerance: Patient limited by fatigue;Other (comment) (Treatment limited secondary to [facility]) Patient left: in bed;with call bell/phone within reach;with nursing/sitter in room Nurse Communication: Mobility status;Need for lift equipment (maximove) PT Visit Diagnosis: Other abnormalities of gait and mobility (R26.89);Muscle weakness (generalized) (M62.81);History of falling (Z91.81);Pain;Unsteadiness on feet (R26.81);Difficulty in  walking, not elsewhere classified (R26.2) Pain - Right/Left: Left (and L) Pain - part of body: Arm;Hand     Time: 8993-8957 PT Time Calculation (min) (ACUTE ONLY): 36 min  Charges:    $Therapeutic Activity: 23-37 mins PT General Charges $$ ACUTE PT VISIT: 1 Visit                     Randall SAUNDERS, PT, DPT Acute Rehabilitation Services Office: [phone] Secure Chat Preferred  Delon CHRISTELLA Callander 02/01/2024, 12:18 PM

## 2024-02-02 DIAGNOSIS — M71061 Abscess of bursa, right knee: Principal | ICD-10-CM

## 2024-02-02 LAB — GLUCOSE, CAPILLARY
Glucose-Capillary: 126 mg/dL — ABNORMAL HIGH (ref 70–99)
Glucose-Capillary: 108 mg/dL — ABNORMAL HIGH (ref 70–99)
Glucose-Capillary: 128 mg/dL — ABNORMAL HIGH (ref 70–99)
Glucose-Capillary: 84 mg/dL (ref 70–99)

## 2024-02-02 MED ORDER — FUROSEMIDE 20 MG PO TABS
20.0000 mg | ORAL_TABLET | ORAL | Status: DC
  Administered 2024-02-02 – 2024-02-04 (×2): 20 mg via ORAL
  Filled 2024-02-02 (×2): qty 1

## 2024-02-02 NOTE — Progress Notes (Signed)
 PROGRESS NOTE    Travis [name]  FMW:[id number] DOB: 1950-09-27 DOA: 01/25/2024 PCP: Ileen [name], NP    Brief Narrative:  This 73 y.o. male with medical history significant of hypertension, hyperlipidemia, diabetes, GERD, PAD, CAD, anemia, BPH, chronic respiratory failure, depression, anxiety, pancreatic insufficiency, obesity, OSA, status post right BKA, status post shoulder replacement, lumbar spine disease status post surgical repair, chronic pain who presented for infected bursitis.  Patient initially presented on 01/25/24 for bursectomy and washout for his infected bursitis.  Patient has history of right BKA/TKA and has developed a wound at his right distal after being in the hospital bed year ago.  Patient has been dealing with this wound on and off for about a year including antibiotics.  Has been following with orthopedic surgery and was diagnosed with infected prepatellar bursitis and was brought in for bursectomy and washout on 01/25/2024.  Patient was also continued on Zyvox  and consultation with ID. Patient has poor progress with physical therapy and in fact appears to be declining patient also reports having pain in the left shoulder left elbow and wrist.  Patient is started on colchicine  by her primary team for suspected gout.  Assessment & Plan:   Principal Problem:   Abscess in bursa Active Problems:   Hyperlipidemia   Chronic pain syndrome   Essential hypertension   Coronary artery disease involving native coronary artery of native heart without angina pectoris   Type 2 diabetes mellitus with obesity (HCC)   Obesity   OSA on CPAP   Anxiety   GERD (gastroesophageal reflux disease)   Major depression   S/P unilateral BKA (below knee amputation), right (HCC)   PAD (peripheral artery disease) (HCC)   Normocytic anemia   Chronic intermittent hypoxia with obstructive sleep apnea   Hypertension associated with type 2 diabetes mellitus (HCC)   BPH (benign  prostatic hyperplasia)   Pancreatic insufficiency   Infection of bursa   Prepatellar bursitis of right knee  Generalized decline/deconditioning: Unclear etiology at this point.  Possibly he did not have significant reserve and has had significant deconditioning in the setting of hospitalization with limited mobility due to risk of further failed healing of his stump wound if he bears weight on it.  He has not been taking his testosterone  here which he become symptomatic without in the past.   He also has been taking gabapentin , which he was no longer taking at home.   Has not been taking his Lasix  every other day but he is not volume overloaded. Obtain daily weight, intake output charting. Continue with PT/ OT Consider CPAP while Restart testosterone  and hold gabapentin . Adjust pain medication line with what he has been taking at home   Hypertension: Resume Lasix .   Hyperlipidemia Continue home atorvastatin    Diabetes Continue SSI   GERD Continue Pepcid  and PPI   PAD /CAD Continue home ASA, atorvastatin , Effient .   Normochromic Anemia H/H remains stable, no signs of any obvious visible bleeding.   BPH: Continue with tamsulosin    History of CHF Last echo was in 2022 and showed EF 65-77, normal diastolic function, normal RV function.   Does not currently appear to be volume overloaded. He was prescribed Lasix  every other day outpatient, Resume Lasix  as patient is having hand swelling.    Depression: Anxiety: - Continue Abilify , donepezil , duloxetine , Ambien .   Pancreatic insufficiency - Has been continued on Creon  for enzyme replacement   Obesity - Noted   OSA - Continue CPAP  Chronic pain - Will adjust pain medication regimen to be closer to home regimen , with still allowing for breakthrough pain medication.   TRH will continue to follow the patient.  Antimicrobials:  Anti-infectives (From admission, onward)    Start     Dose/Rate Route Frequency  Ordered Stop   01/29/24 2200  linezolid  (ZYVOX ) tablet 600 mg        600 mg Oral Every 12 hours 01/29/24 1139     01/28/24 1000  linezolid  (ZYVOX ) IVPB 600 mg  Status:  Discontinued        600 mg 300 mL/hr over 60 Minutes Intravenous Every 12 hours 01/28/24 0817 01/29/24 1139   01/25/24 1330  emtricitabine -tenofovir  AF (DESCOVY ) 200-25 MG per tablet 1 tablet        1 tablet Oral Daily 01/25/24 1109     01/25/24 1330  ceFAZolin  (ANCEF ) IVPB 2g/100 mL premix        2 g 200 mL/hr over 30 Minutes Intravenous Every 6 hours 01/25/24 1109 01/27/24 0720   01/25/24 0747  vancomycin  (VANCOCIN ) powder  Status:  Discontinued          As needed 01/25/24 0748 01/25/24 0816   01/25/24 0600  ceFAZolin  (ANCEF ) IVPB 2g/100 mL premix        2 g 200 mL/hr over 30 Minutes Intravenous On call to O.R. 01/25/24 9451 01/25/24 0748        Subjective: Patient was seen and examined at bedside. Patient appears less oriented, arousable but not following commands. RN reports patient has not slept last night or was given pain medicines.  Objective: Vitals:   02/01/24 1547 02/01/24 2016 02/02/24 0429 02/02/24 0807  BP: 121/61 (!) 133/52 124/74 (!) 109/56  Pulse: 75 (!) 59 60 60  Resp: 17 14 15 18   Temp: 98.2 F (36.8 C) 97.9 F (36.6 C) 98.4 F (36.9 C) 98.2 F (36.8 C)  TempSrc:  Oral Oral Oral  SpO2: 99% 99% 98% 97%  Weight:      Height:        Intake/Output Summary (Last 24 hours) at 02/02/2024 1402 Last data filed at 02/02/2024 1247 Gross per 24 hour  Intake 960 ml  Output --  Net 960 ml   Filed Weights   01/25/24 0547  Weight: 88.5 kg    Examination:  General exam: Appears calm and comfortable, arousable, not in any acute distress. Respiratory system: CTA Bilaterally . Respiratory effort normal. RR 16 Cardiovascular system: S1 & S2 heard, RRR. No JVD, murmurs, rubs, gallops or clicks.  Gastrointestinal system: Abdomen is non distended, soft and non tender. Normal bowel sounds  heard. Central nervous system: Arousable, but not following commands, no focal neurological deficits. Extremities: Both upper extremities are swollen. Skin: No rashes, lesions or ulcers Psychiatry: Not assessed.  Data Reviewed: I have personally reviewed following labs and imaging studies  CBC: Recent Labs  Lab 02/01/24 1523  WBC 4.0  HGB 9.0*  HCT 27.7*  MCV 87.9  PLT 176   Basic Metabolic Panel: Recent Labs  Lab 02/01/24 0512 02/01/24 1523  NA  --  133*  K  --  4.3  CL  --  96*  CO2  --  22  GLUCOSE  --  163*  BUN  --  20  CREATININE 0.86 0.95  CALCIUM   --  8.3*   GFR: Estimated Creatinine Clearance: 78.3 mL/min (by C-G formula based on SCr of 0.95 mg/dL). Liver Function Tests: Recent Labs  Lab 02/01/24 1523  AST 28  ALT 17  ALKPHOS 75  BILITOT 0.9  PROT 5.4*  ALBUMIN  2.6*   No results for input(s): LIPASE, AMYLASE in the last 168 hours. No results for input(s): AMMONIA in the last 168 hours. Coagulation Profile: No results for input(s): INR, PROTIME in the last 168 hours. Cardiac Enzymes: No results for input(s): CKTOTAL, CKMB, CKMBINDEX, TROPONINI in the last 168 hours. BNP (last 3 results) No results for input(s): PROBNP in the last 8760 hours. HbA1C: No results for input(s): HGBA1C in the last 72 hours. CBG: Recent Labs  Lab 02/01/24 1202 02/01/24 1650 02/01/24 2047 02/02/24 0607 02/02/24 1108  GLUCAP 113* 145* 143* 126* 108*   Lipid Profile: No results for input(s): CHOL, HDL, LDLCALC, TRIG, CHOLHDL, LDLDIRECT in the last 72 hours. Thyroid  Function Tests: Recent Labs    02/01/24 1523  TSH 2.610   Anemia Panel: No results for input(s): VITAMINB12, FOLATE, FERRITIN, TIBC, IRON , RETICCTPCT in the last 72 hours. Sepsis Labs: No results for input(s): PROCALCITON, LATICACIDVEN in the last 168 hours.  Recent Results (from the past 240 hours)  Aerobic/Anaerobic Culture w Gram Stain  (surgical/deep wound)     Status: None   Collection Time: 01/25/24  7:54 AM   Specimen: Leg, Right; Tissue  Result Value Ref Range Status   Specimen Description TISSUE  Final   Special Requests RIGHT LEG KNEE BURSA  Final   Gram Stain NO WBC SEEN NO ORGANISMS SEEN   Final   Culture   Final    FEW STAPHYLOCOCCUS HAEMOLYTICUS FEW CORYNEBACTERIUM STRIATUM Standardized susceptibility testing for this organism is not available. NO ANAEROBES ISOLATED Performed at [facility], [address], [place], [place] [address]    Report Status 01/30/2024 FINAL  Final   Organism ID, Bacteria STAPHYLOCOCCUS HAEMOLYTICUS  Final      Susceptibility   Staphylococcus haemolyticus - MIC*    CIPROFLOXACIN  >=8 RESISTANT Resistant     ERYTHROMYCIN >=8 RESISTANT Resistant     GENTAMICIN >=16 RESISTANT Resistant     OXACILLIN >=4 RESISTANT Resistant     TETRACYCLINE 2 SENSITIVE Sensitive     VANCOMYCIN  1 SENSITIVE Sensitive     TRIMETH/SULFA 20 SENSITIVE Sensitive     CLINDAMYCIN  >=8 RESISTANT Resistant     RIFAMPIN >=32 RESISTANT Resistant     Inducible Clindamycin  NEGATIVE Sensitive     * FEW STAPHYLOCOCCUS HAEMOLYTICUS    Radiology Studies: No results found.  Scheduled Meds:  ARIPiprazole   15 mg Oral QPM   aspirin  EC  81 mg Oral QHS   atorvastatin   20 mg Oral Daily   colchicine   0.6 mg Oral Daily   docusate sodium   100 mg Oral BID   donepezil   10 mg Oral QHS   DULoxetine   60 mg Oral BID   emtricitabine -tenofovir  AF  1 tablet Oral Daily   enoxaparin  (LOVENOX ) injection  40 mg Subcutaneous Q24H   famotidine   40 mg Oral QHS   fluticasone   2 spray Each Nare QHS   insulin  aspart  0-15 Units Subcutaneous TID WC   linezolid   600 mg Oral Q12H   lipase/protease/amylase  36,000 Units Oral TID with meals   methocarbamol   500 mg Oral Q8H   pantoprazole   40 mg Oral TID   polyethylene glycol  17 g Oral Daily   prasugrel   5 mg Oral Daily   tamsulosin   0.4 mg Oral BID   testosterone   5 g  Transdermal Daily   traZODone   150 mg Oral QHS  zolpidem   10 mg Oral QHS   Continuous Infusions:   LOS: 7 days    Time spent: 35 mins    Darcel Dawley, MD [facility]   If 7PM-7AM, please contact night-coverage

## 2024-02-02 NOTE — Progress Notes (Signed)
 Patient ID: Travis FORBES [name]., male   DOB: May 20, 1951, 73 y.o.   MRN: [id number] Patient is seen in follow-up status post debridement of infected bursa right knee.  Patient has decreased alertness this morning.  He states the pain and swelling in the left upper extremity has improved.  Thank you for medical consultation.

## 2024-02-03 DIAGNOSIS — M71061 Abscess of bursa, right knee: Secondary | ICD-10-CM | POA: Diagnosis not present

## 2024-02-03 LAB — GLUCOSE, CAPILLARY
Glucose-Capillary: 117 mg/dL — ABNORMAL HIGH (ref 70–99)
Glucose-Capillary: 103 mg/dL — ABNORMAL HIGH (ref 70–99)
Glucose-Capillary: 117 mg/dL — ABNORMAL HIGH (ref 70–99)
Glucose-Capillary: 105 mg/dL — ABNORMAL HIGH (ref 70–99)
Glucose-Capillary: 92 mg/dL (ref 70–99)

## 2024-02-03 NOTE — Progress Notes (Signed)
 PROGRESS NOTE    Travis [name]  FMW:[id number] DOB: Apr 03, 1951 DOA: 01/25/2024 PCP: Ileen [name], NP    Brief Narrative:  This 73 y.o. male with medical history significant of hypertension, hyperlipidemia, diabetes, GERD, PAD, CAD, anemia, BPH, chronic respiratory failure, depression, anxiety, pancreatic insufficiency, obesity, OSA, status post right BKA, status post shoulder replacement, lumbar spine disease status post surgical repair, chronic pain who presented for infected bursitis.  Patient initially presented on 01/25/24 for bursectomy and washout for his infected bursitis.  Patient has history of right BKA/TKA and has developed a wound at his right distal after being in the hospital bed year ago.  Patient has been dealing with this wound on and off for about a year including antibiotics.  Has been following with orthopedic surgery and was diagnosed with infected prepatellar bursitis and was brought in for bursectomy and washout on 01/25/2024.  Patient was also continued on Zyvox  and consultation with ID. Patient has poor progress with physical therapy and in fact appears to be declining patient also reports having pain in the left shoulder left elbow and wrist.  Patient is started on colchicine  by her primary team for suspected gout.  Assessment & Plan:   Principal Problem:   Abscess in bursa Active Problems:   Hyperlipidemia   Chronic pain syndrome   Essential hypertension   Coronary artery disease involving native coronary artery of native heart without angina pectoris   Type 2 diabetes mellitus with obesity (HCC)   Obesity   OSA on CPAP   Anxiety   GERD (gastroesophageal reflux disease)   Major depression   S/P unilateral BKA (below knee amputation), right (HCC)   PAD (peripheral artery disease) (HCC)   Normocytic anemia   Chronic intermittent hypoxia with obstructive sleep apnea   Hypertension associated with type 2 diabetes mellitus (HCC)   BPH (benign  prostatic hyperplasia)   Pancreatic insufficiency   Infection of bursa   Prepatellar bursitis of right knee  Generalized decline / Deconditioning: Unclear etiology at this point.  Possibly he did not have significant reserve and has had significant deconditioning in the setting of hospitalization with limited mobility due to risk of further failed healing of his stump wound if he bears weight on it.  He has not been taking his testosterone  here which he become symptomatic without in the past.   He also has been taking gabapentin , which he was no longer taking at home.   Has not been taking his Lasix  every other day but he is not volume overloaded. Obtain daily weight, intake output charting. Continue with PT/ OT Consider CPAP while Restart testosterone  and hold gabapentin . Adjust pain medication line with what he has been taking at home Patient seems much alert and oriented,  following commands,  swelling is better and alertness is better.   Hypertension: Resume Lasix .   Hyperlipidemia Continue home atorvastatin .   Diabetes Mellitus II Continue SSI.   GERD: Continue Pepcid  and PPI.   PAD /CAD: Continue home ASA, atorvastatin , Effient .   Normochromic Anemia: H/H remains stable, no signs of any obvious visible bleeding.   BPH: Continue with tamsulosin    History of CHF Last echo was in 2022 and showed EF 65-77, normal diastolic function, normal RV function.   Does not currently appear to be volume overloaded. He was prescribed Lasix  every other day outpatient, Resume Lasix  as patient is having hand swelling.    Depression: Anxiety: - Continue Abilify , donepezil , duloxetine , Ambien .   Pancreatic insufficiency -  Has been continued on Creon  for enzyme replacement   Obesity - Noted   OSA - Continue CPAP   Chronic pain - Will adjust pain medication regimen to be closer to home regimen , with still allowing for breakthrough pain medication.   TRH will continue to  follow the patient.  Antimicrobials:  Anti-infectives (From admission, onward)    Start     Dose/Rate Route Frequency Ordered Stop   01/29/24 2200  linezolid  (ZYVOX ) tablet 600 mg        600 mg Oral Every 12 hours 01/29/24 1139     01/28/24 1000  linezolid  (ZYVOX ) IVPB 600 mg  Status:  Discontinued        600 mg 300 mL/hr over 60 Minutes Intravenous Every 12 hours 01/28/24 0817 01/29/24 1139   01/25/24 1330  emtricitabine -tenofovir  AF (DESCOVY ) 200-25 MG per tablet 1 tablet        1 tablet Oral Daily 01/25/24 1109     01/25/24 1330  ceFAZolin  (ANCEF ) IVPB 2g/100 mL premix        2 g 200 mL/hr over 30 Minutes Intravenous Every 6 hours 01/25/24 1109 01/27/24 0720   01/25/24 0747  vancomycin  (VANCOCIN ) powder  Status:  Discontinued          As needed 01/25/24 0748 01/25/24 0816   01/25/24 0600  ceFAZolin  (ANCEF ) IVPB 2g/100 mL premix        2 g 200 mL/hr over 30 Minutes Intravenous On call to O.R. 01/25/24 9451 01/25/24 0748        Subjective: Patient was seen and examined at bedside. Patient appears much alert, oriented, following commands , very involved in conversation. Hand swelling is much improved.  Objective: Vitals:   02/02/24 1947 02/03/24 0417 02/03/24 0500 02/03/24 0724  BP: 139/64 137/63  136/70  Pulse: (!) 57 (!) 53  63  Resp: 16 15  15   Temp: 98.7 F (37.1 C) 98.4 F (36.9 C)  97.8 F (36.6 C)  TempSrc: Oral Oral  Oral  SpO2: 98% 99%  100%  Weight:   91.1 kg   Height:        Intake/Output Summary (Last 24 hours) at 02/03/2024 1124 Last data filed at 02/02/2024 1247 Gross per 24 hour  Intake 240 ml  Output --  Net 240 ml   Filed Weights   01/25/24 0547 02/03/24 0500  Weight: 88.5 kg 91.1 kg    Examination:  General exam: Appears calm and comfortable, not in any acute distress. Respiratory system: CTA Bilaterally . Respiratory effort normal. RR 16 Cardiovascular system: S1 & S2 heard, RRR. No JVD, murmurs, rubs, gallops or clicks.   Gastrointestinal system: Abdomen is non distended, soft and non tender. Normal bowel sounds heard. Central nervous system: Very alert and oriented x 3., no focal neurological deficits. Extremities: No edema, no cyanosis, no clubbing.  Right BKA Skin: No rashes, lesions or ulcers Psychiatry: Alert and oriented, no signs of psychosis.  Data Reviewed: I have personally reviewed following labs and imaging studies  CBC: Recent Labs  Lab 02/01/24 1523  WBC 4.0  HGB 9.0*  HCT 27.7*  MCV 87.9  PLT 176   Basic Metabolic Panel: Recent Labs  Lab 02/01/24 0512 02/01/24 1523  NA  --  133*  K  --  4.3  CL  --  96*  CO2  --  22  GLUCOSE  --  163*  BUN  --  20  CREATININE 0.86 0.95  CALCIUM   --  8.3*  GFR: Estimated Creatinine Clearance: 78.3 mL/min (by C-G formula based on SCr of 0.95 mg/dL). Liver Function Tests: Recent Labs  Lab 02/01/24 1523  AST 28  ALT 17  ALKPHOS 75  BILITOT 0.9  PROT 5.4*  ALBUMIN  2.6*   No results for input(s): LIPASE, AMYLASE in the last 168 hours. No results for input(s): AMMONIA in the last 168 hours. Coagulation Profile: No results for input(s): INR, PROTIME in the last 168 hours. Cardiac Enzymes: No results for input(s): CKTOTAL, CKMB, CKMBINDEX, TROPONINI in the last 168 hours. BNP (last 3 results) No results for input(s): PROBNP in the last 8760 hours. HbA1C: No results for input(s): HGBA1C in the last 72 hours. CBG: Recent Labs  Lab 02/02/24 1108 02/02/24 1728 02/02/24 2106 02/03/24 0611 02/03/24 0720  GLUCAP 108* 128* 84 117* 103*   Lipid Profile: No results for input(s): CHOL, HDL, LDLCALC, TRIG, CHOLHDL, LDLDIRECT in the last 72 hours. Thyroid  Function Tests: Recent Labs    02/01/24 1523  TSH 2.610   Anemia Panel: No results for input(s): VITAMINB12, FOLATE, FERRITIN, TIBC, IRON , RETICCTPCT in the last 72 hours. Sepsis Labs: No results for input(s): PROCALCITON,  LATICACIDVEN in the last 168 hours.  Recent Results (from the past 240 hours)  Aerobic/Anaerobic Culture w Gram Stain (surgical/deep wound)     Status: None   Collection Time: 01/25/24  7:54 AM   Specimen: Leg, Right; Tissue  Result Value Ref Range Status   Specimen Description TISSUE  Final   Special Requests RIGHT LEG KNEE BURSA  Final   Gram Stain NO WBC SEEN NO ORGANISMS SEEN   Final   Culture   Final    FEW STAPHYLOCOCCUS HAEMOLYTICUS FEW CORYNEBACTERIUM STRIATUM Standardized susceptibility testing for this organism is not available. NO ANAEROBES ISOLATED Performed at [facility], [address], [place], [place] [address]    Report Status 01/30/2024 FINAL  Final   Organism ID, Bacteria STAPHYLOCOCCUS HAEMOLYTICUS  Final      Susceptibility   Staphylococcus haemolyticus - MIC*    CIPROFLOXACIN  >=8 RESISTANT Resistant     ERYTHROMYCIN >=8 RESISTANT Resistant     GENTAMICIN >=16 RESISTANT Resistant     OXACILLIN >=4 RESISTANT Resistant     TETRACYCLINE 2 SENSITIVE Sensitive     VANCOMYCIN  1 SENSITIVE Sensitive     TRIMETH/SULFA 20 SENSITIVE Sensitive     CLINDAMYCIN  >=8 RESISTANT Resistant     RIFAMPIN >=32 RESISTANT Resistant     Inducible Clindamycin  NEGATIVE Sensitive     * FEW STAPHYLOCOCCUS HAEMOLYTICUS    Radiology Studies: No results found.  Scheduled Meds:  ARIPiprazole   15 mg Oral QPM   aspirin  EC  81 mg Oral QHS   atorvastatin   20 mg Oral Daily   colchicine   0.6 mg Oral Daily   docusate sodium   100 mg Oral BID   donepezil   10 mg Oral QHS   DULoxetine   60 mg Oral BID   emtricitabine -tenofovir  AF  1 tablet Oral Daily   enoxaparin  (LOVENOX ) injection  40 mg Subcutaneous Q24H   famotidine   40 mg Oral QHS   fluticasone   2 spray Each Nare QHS   furosemide   20 mg Oral QODAY   insulin  aspart  0-15 Units Subcutaneous TID WC   linezolid   600 mg Oral Q12H   lipase/protease/amylase  36,000 Units Oral TID with meals   methocarbamol   500 mg Oral Q8H    pantoprazole   40 mg Oral TID   polyethylene glycol  17 g Oral Daily  prasugrel   5 mg Oral Daily   tamsulosin   0.4 mg Oral BID   testosterone   5 g Transdermal Daily   traZODone   150 mg Oral QHS   zolpidem   10 mg Oral QHS   Continuous Infusions:   LOS: 8 days    Time spent: 35 mins    Darcel Dawley, MD [facility]   If 7PM-7AM, please contact night-coverage

## 2024-02-03 NOTE — Plan of Care (Signed)
   Problem: Education: Goal: Knowledge of General Education information will improve Description: Including pain rating scale, medication(s)/side effects and non-pharmacologic comfort measures Outcome: Progressing   Problem: Nutrition: Goal: Adequate nutrition will be maintained Outcome: Progressing   Problem: Coping: Goal: Level of anxiety will decrease Outcome: Progressing

## 2024-02-03 NOTE — Progress Notes (Signed)
 Patient ID: Travis FORBES [name]., male   DOB: 05-26-1950, 73 y.o.   MRN: [id number] Patient improved alertness this morning.  Pain controlled at the right leg and left upper extremity. Appreciate medical recs, pain regimen has been modified due to AMS yesterday.

## 2024-02-04 DIAGNOSIS — M71061 Abscess of bursa, right knee: Secondary | ICD-10-CM | POA: Diagnosis not present

## 2024-02-04 LAB — GLUCOSE, CAPILLARY
Glucose-Capillary: 98 mg/dL (ref 70–99)
Glucose-Capillary: 111 mg/dL — ABNORMAL HIGH (ref 70–99)
Glucose-Capillary: 97 mg/dL (ref 70–99)
Glucose-Capillary: 119 mg/dL — ABNORMAL HIGH (ref 70–99)

## 2024-02-04 MED ORDER — SIMETHICONE 80 MG PO CHEW
80.0000 mg | CHEWABLE_TABLET | Freq: Four times a day (QID) | ORAL | Status: DC | PRN
  Administered 2024-02-04: 80 mg via ORAL
  Filled 2024-02-04: qty 1

## 2024-02-04 MED ORDER — GABAPENTIN 300 MG PO CAPS
300.0000 mg | ORAL_CAPSULE | Freq: Three times a day (TID) | ORAL | Status: DC
  Administered 2024-02-04 – 2024-02-05 (×4): 300 mg via ORAL
  Filled 2024-02-04 (×4): qty 1

## 2024-02-04 NOTE — TOC Progression Note (Addendum)
 Transition of Care (TOC) - Progression Note    Patient Details  Name: Travis TURNBAUGH Sr. MRN: [id number] Date of Birth: 1951/01/03  Transition of Care [facility]) CM/SW Contact  Bridget Cordella Simmonds, LCSW Phone Number: 02/04/2024, 11:29 AM  Clinical Narrative: CSW spoke with pt regarding his prior decision to decline SNF placement.  Pt reports he is now willing to pursue SNF placement, permission given to send out referral in the hub.  Referral sent out in hub for SNF.    1300: Bed offers provided to pt on medicare choice document.  He will review.   1600: [facility] offers, pt informed.  He will accept offer at [facility].  Tanya/[facility] informed.  Angela/CMA will start aetna auth.    Expected Discharge Plan: Home w Home Health Services Barriers to Discharge: Continued Medical Work up               Expected Discharge Plan and Services     Post Acute Care Choice: Resumption of Svcs/PTA Provider, Home Health                             HH Arranged: RN, PT, OT [facility] Agency: [facility] Date [facility] Agency Contacted: 01/30/24 Time HH Agency Contacted: 1548 Representative spoke with at [facility] Agency: [place]   Social Drivers of Health (SDOH) Interventions SDOH Screenings   Food Insecurity: No Food Insecurity (01/25/2024)  Housing: Low Risk  (01/25/2024)  Transportation Needs: No Transportation Needs (01/25/2024)  Utilities: Not At Risk (01/25/2024)  Depression (PHQ2-9): Medium Risk (08/15/2023)  Financial Resource Strain: High Risk (10/10/2022)   Received from [facility]  Physical Activity: Unknown (10/10/2022)   Received from [facility]  Social Connections: Socially Isolated (01/25/2024)  Stress: Stress Concern Present (10/10/2022)   Received from [facility]  Tobacco Use: Medium Risk (01/25/2024)    Readmission Risk Interventions     No data to display

## 2024-02-04 NOTE — Progress Notes (Signed)
 Patient ID: Travis FORBES [name]., male   DOB: January 22, 1951, 73 y.o.   MRN: [id number] Patient is much more alert this morning.  He request resuming his Neurontin  for his cervical radiculopathy.  The incision is healing well.  Anticipate patient will benefit from skilled nursing placement.

## 2024-02-04 NOTE — Plan of Care (Signed)

## 2024-02-04 NOTE — Progress Notes (Signed)
 Physical Therapy Treatment Patient Details Name: PASTOR SGRO Sr. MRN: [id number] DOB: 11-15-1950 Today's Date: 02/04/2024   History of Present Illness Travis [name]. is a 73 y.o. male admitted on 01/25/24 for R knee infection, s/p excision of a infected prepatellar bursa right knee. PMH significant for BPH, Left reverse total shoulder arthroplasty 2022, L THA 2021, s/p R BKA 2019, lumbar laminectomy 2020, PAD, B TKA 2017.    PT Comments  Pt with slow progress.  He did have improved pain in L UE and able to assist more with scooting and transfers; however, R shoulder still limited ROM and strength - reports severe arthritis in need of TSA.  Required assist of 2 for transfers and attempts to stand in STEDY using L LE.  Unable to do prosthesis on R LE.  Pt also reports chronic pain/weakness in L LE.  Standing continues to be difficult until pt can don prosthesis, consider progressing lateral scoots next visit.  Recommend Patient will benefit from continued inpatient follow up therapy, <3 hours/day at d/c.     If plan is discharge home, recommend the following: Two people to help with walking and/or transfers;Two people to help with bathing/dressing/bathroom;Assistance with cooking/housework;Assist for transportation;Help with stairs or ramp for entrance   Can travel by private vehicle     No  Equipment Recommendations  BSC/3in1;Wheelchair cushion (measurements PT);Wheelchair (measurements PT);Hospital bed (drop arm)    Recommendations for Other Services       Precautions / Restrictions Precautions Precautions: Fall Restrictions RLE Weight Bearing Per Provider Order: Weight bearing as tolerated Other Position/Activity Restrictions: Per ortho if can get on prosthetic can be WBAT     Mobility  Bed Mobility Overal bed mobility: Needs Assistance Bed Mobility: Supine to Sit, Sit to Supine     Supine to sit: Mod assist, +2 for physical assistance, HOB elevated Sit to supine: Mod  assist, +2 for physical assistance, HOB elevated   General bed mobility comments: L leg flexed and blocked-cues to push down and scoot buttock to EOB, then roll, and push to sit.  Needs increased assist due to shoulder pain when pushing to sit.  For return to bed needing assist to control tunk, lift legs, and then reposisiont    Transfers Overall transfer level: Needs assistance Equipment used: Ambulation equipment used Transfers: Sit to/from Stand Sit to Stand: From elevated surface, Mod assist, +2 physical assistance, Via lift equipment           General transfer comment: Attempted to don prosthesis but unable due to edema.  Pt wearing shrinker under the prosthetic sleeve - discussed trying without but pt reports he always wears a layer under his prosthetic sleeve and does not feel that is contributing to difficulty donning.  He did agree to try but upon removal of shrinker incision was oozing so opted to not attempt with prosthtic.  Replaced non-adhearant dressing as the one in place had fallen off and then placed shrinker.  By end of session oozing had stopped.  Initial attempt to stand on L LE into STEDY - pt able to straighten knee but not tuck bottom to stand upright -returned to sitting.  Rested and then tried again and pt able to achieve upright in STEDY with mod x 2.  STEDY pads placed.  Pt had been having frequent BM so opted not to transfer to chair.  Did move closer to [facility] and stood again from STEDY mod x 2.    Ambulation/Gait  Stairs             Wheelchair Mobility     Tilt Bed    Modified Rankin (Stroke Patients Only)       Balance Overall balance assessment: Needs assistance Sitting-balance support: No upper extremity supported, Bilateral upper extremity supported Sitting balance-Leahy Scale: Fair Sitting balance - Comments: Initially with posterior lean requiring UE use and min A but able to progress to close supervision static  sitting but needing min A with challenges   Standing balance support: Bilateral upper extremity supported Standing balance-Leahy Scale: Zero Standing balance comment: Only standing 5 seconds with assist in STEDY                            Communication    Cognition Arousal: Alert Behavior During Therapy: WFL for tasks assessed/performed   PT - Cognitive impairments: Sequencing, Problem solving                       PT - Cognition Comments: Cues for sequencing   Following commands impaired: Follows one step commands with increased time    Cueing    Exercises General Exercises - Lower Extremity Quad Sets: AROM, Both, 10 reps, Supine Long Arc Quad: AROM, 10 reps, Seated, Both Hip ABduction/ADduction: Strengthening, Right, 10 reps, Supine, Limitations Hip Abduction/Adduction Limitations: provided min resistance abd and add Straight Leg Raises: AROM, Both, 10 reps, Supine, Limitations Straight Leg Raises Limitations: cues to keep knees straight Other Exercises Other Exercises: Partial bridging L LE with foot blocked - fatigued after 5 reps Other Exercises: R knee flexion stretch 30 sec hold x 2    General Comments        Pertinent Vitals/Pain Pain Assessment Pain Assessment: Faces Faces Pain Scale: Hurts even more Pain Location: R shoulder and entire L leg Pain Descriptors / Indicators: Grimacing, Discomfort, Aching, Sore, Tender Pain Intervention(s): Limited activity within patient's tolerance, Monitored during session, Premedicated before session, Repositioned    Home Living                          Prior Function            PT Goals (current goals can now be found in the care plan section) Progress towards PT goals: Progressing toward goals (slow)    Frequency    Min 2X/week      PT Plan      Co-evaluation              AM-PAC PT 6 Clicks Mobility   Outcome Measure  Help needed turning from your back to your side  while in a flat bed without using bedrails?: Total Help needed moving from lying on your back to sitting on the side of a flat bed without using bedrails?: Total Help needed moving to and from a bed to a chair (including a wheelchair)?: Total Help needed standing up from a chair using your arms (e.g., wheelchair or bedside chair)?: Total Help needed to walk in hospital room?: Total Help needed climbing 3-5 steps with a railing? : Total 6 Click Score: 6    End of Session Equipment Utilized During Treatment: Gait belt Activity Tolerance: Patient tolerated treatment well Patient left: in bed;with call bell/phone within reach;with bed alarm set Nurse Communication: Mobility status;Need for lift equipment (Maximove for OOB w nsg) PT Visit Diagnosis: Other abnormalities of gait and mobility (R26.89);Muscle weakness (  generalized) (M62.81);History of falling (Z91.81);Pain;Unsteadiness on feet (R26.81);Difficulty in walking, not elsewhere classified (R26.2) Pain - Right/Left: Left Pain - part of body: Arm;Hand     Time: 1150-1220 PT Time Calculation (min) (ACUTE ONLY): 30 min  Charges:    $Therapeutic Exercise: 8-22 mins $Therapeutic Activity: 8-22 mins PT General Charges $$ ACUTE PT VISIT: 1 Visit                     Benjiman, PT Acute Rehab [facility] [phone]     Benjiman VEAR [place] 02/04/2024, 2:01 PM

## 2024-02-04 NOTE — Progress Notes (Signed)
 PROGRESS NOTE    Travis [name]  FMW:[id number] DOB: Oct 20, 1950 DOA: 01/25/2024 PCP: Ileen [name], NP    Brief Narrative:  This 73 y.o. male with medical history significant of hypertension, hyperlipidemia, diabetes, GERD, PAD, CAD, anemia, BPH, chronic respiratory failure, depression, anxiety, pancreatic insufficiency, obesity, OSA, status post right BKA, status post shoulder replacement, lumbar spine disease status post surgical repair, chronic pain who presented for infected bursitis.  Patient initially presented on 01/25/24 for bursectomy and washout for his infected bursitis.  Patient has history of right BKA/TKA and has developed a wound at his right distal after being in the hospital bed year ago.  Patient has been dealing with this wound on and off for about a year including antibiotics.  Has been following with orthopedic surgery and was diagnosed with infected prepatellar bursitis and was brought in for bursectomy and washout on 01/25/2024.  Patient was also continued on Zyvox  and consultation with ID. Patient has poor progress with physical therapy and in fact appears to be declining patient also reports having pain in the left shoulder left elbow and wrist.  Patient is started on colchicine  by her primary team for suspected gout.  Assessment & Plan:   Principal Problem:   Abscess in bursa Active Problems:   Hyperlipidemia   Chronic pain syndrome   Essential hypertension   Coronary artery disease involving native coronary artery of native heart without angina pectoris   Type 2 diabetes mellitus with obesity (HCC)   Obesity   OSA on CPAP   Anxiety   GERD (gastroesophageal reflux disease)   Major depression   S/P unilateral BKA (below knee amputation), right (HCC)   PAD (peripheral artery disease) (HCC)   Normocytic anemia   Chronic intermittent hypoxia with obstructive sleep apnea   Hypertension associated with type 2 diabetes mellitus (HCC)   BPH (benign  prostatic hyperplasia)   Pancreatic insufficiency   Infection of bursa   Prepatellar bursitis of right knee  Generalized decline / Deconditioning: Possibly he did not have significant reserve and has had significant deconditioning in the setting of hospitalization with limited mobility due to risk of further failed healing of his stump wound if he bears weight on it.  He has not been taking his testosterone  here which he become symptomatic without in the past.   He also has been taking gabapentin , which he was no longer taking at home.   Has not been taking his Lasix  every other day but he is not volume overloaded. Obtain daily weight, intake output charting. Continue with PT/ OT Consider CPAP while Restart testosterone  and hold gabapentin . Adjust pain medication line with what he has been taking at home Patient seems much alert and oriented,  following commands,  swelling is better and alertness is better.   Hypertension: Resume Lasix .   Hyperlipidemia Continue home atorvastatin .   Diabetes Mellitus II Continue SSI.   GERD: Continue Pepcid  and PPI.   PAD /CAD: Continue home ASA, atorvastatin , Effient .   Normochromic Anemia: H/H remains stable, no signs of any obvious visible bleeding.   BPH: Continue with tamsulosin    History of CHF Last echo was in 2022 and showed EF 65-77, normal diastolic function, normal RV function.   Does not currently appear to be volume overloaded. He was prescribed Lasix  every other day outpatient, Resume Lasix  as patient is having hand swelling.  Depression: Anxiety: - Continue Abilify , donepezil , duloxetine , Ambien .   Pancreatic insufficiency: - Has been continued on Creon  for enzyme replacement.  Obesity - Noted   OSA - Continue CPAP   Chronic pain - Will adjust pain medication regimen to be closer to home regimen , with still allowing for breakthrough pain medication.   We will sign out.  Reconsult if  needed.   Antimicrobials:  Anti-infectives (From admission, onward)    Start     Dose/Rate Route Frequency Ordered Stop   01/29/24 2200  linezolid  (ZYVOX ) tablet 600 mg        600 mg Oral Every 12 hours 01/29/24 1139     01/28/24 1000  linezolid  (ZYVOX ) IVPB 600 mg  Status:  Discontinued        600 mg 300 mL/hr over 60 Minutes Intravenous Every 12 hours 01/28/24 0817 01/29/24 1139   01/25/24 1330  emtricitabine -tenofovir  AF (DESCOVY ) 200-25 MG per tablet 1 tablet        1 tablet Oral Daily 01/25/24 1109     01/25/24 1330  ceFAZolin  (ANCEF ) IVPB 2g/100 mL premix        2 g 200 mL/hr over 30 Minutes Intravenous Every 6 hours 01/25/24 1109 01/27/24 0720   01/25/24 0747  vancomycin  (VANCOCIN ) powder  Status:  Discontinued          As needed 01/25/24 0748 01/25/24 0816   01/25/24 0600  ceFAZolin  (ANCEF ) IVPB 2g/100 mL premix        2 g 200 mL/hr over 30 Minutes Intravenous On call to O.R. 01/25/24 9451 01/25/24 0748        Subjective: Patient was seen and examined at bedside.  Overnight events noted. Patient appears much alert, oriented, following commands , very involved in conversation. Hand swelling is much improved.  Objective: Vitals:   02/03/24 1945 02/04/24 0400 02/04/24 0706 02/04/24 1116  BP: 119/61 (!) 138/57  130/75  Pulse: 64 60  76  Resp: 18 15  18   Temp: 98.4 F (36.9 C) 98.5 F (36.9 C)  98.1 F (36.7 C)  TempSrc: Oral Oral  Oral  SpO2: 100% 98%  100%  Weight:   89.5 kg   Height:        Intake/Output Summary (Last 24 hours) at 02/04/2024 1338 Last data filed at 02/04/2024 0300 Gross per 24 hour  Intake --  Output 600 ml  Net -600 ml   Filed Weights   01/25/24 0547 02/03/24 0500 02/04/24 0706  Weight: 88.5 kg 91.1 kg 89.5 kg    Examination:  General exam: Appears calm and comfortable, not in any acute distress. Respiratory system: CTA Bilaterally . Respiratory effort normal. RR 16 Cardiovascular system: S1 & S2 heard, RRR. No JVD, murmurs, rubs,  gallops or clicks.  Gastrointestinal system: Abdomen is non distended, soft and non tender. Normal bowel sounds heard. Central nervous system: Very alert and oriented x 3., no focal neurological deficits. Extremities: No edema, no cyanosis, no clubbing.  Right BKA Skin: No rashes, lesions or ulcers Psychiatry: Alert and oriented, no signs of psychosis.  Data Reviewed: I have personally reviewed following labs and imaging studies  CBC: Recent Labs  Lab 02/01/24 1523  WBC 4.0  HGB 9.0*  HCT 27.7*  MCV 87.9  PLT 176   Basic Metabolic Panel: Recent Labs  Lab 02/01/24 0512 02/01/24 1523  NA  --  133*  K  --  4.3  CL  --  96*  CO2  --  22  GLUCOSE  --  163*  BUN  --  20  CREATININE 0.86 0.95  CALCIUM   --  8.3*  GFR: Estimated Creatinine Clearance: 78.3 mL/min (by C-G formula based on SCr of 0.95 mg/dL). Liver Function Tests: Recent Labs  Lab 02/01/24 1523  AST 28  ALT 17  ALKPHOS 75  BILITOT 0.9  PROT 5.4*  ALBUMIN  2.6*   No results for input(s): LIPASE, AMYLASE in the last 168 hours. No results for input(s): AMMONIA in the last 168 hours. Coagulation Profile: No results for input(s): INR, PROTIME in the last 168 hours. Cardiac Enzymes: No results for input(s): CKTOTAL, CKMB, CKMBINDEX, TROPONINI in the last 168 hours. BNP (last 3 results) No results for input(s): PROBNP in the last 8760 hours. HbA1C: No results for input(s): HGBA1C in the last 72 hours. CBG: Recent Labs  Lab 02/03/24 1146 02/03/24 1601 02/03/24 2108 02/04/24 0603 02/04/24 1127  GLUCAP 117* 105* 92 98 111*   Lipid Profile: No results for input(s): CHOL, HDL, LDLCALC, TRIG, CHOLHDL, LDLDIRECT in the last 72 hours. Thyroid  Function Tests: Recent Labs    02/01/24 1523  TSH 2.610   Anemia Panel: No results for input(s): VITAMINB12, FOLATE, FERRITIN, TIBC, IRON , RETICCTPCT in the last 72 hours. Sepsis Labs: No results for input(s):  PROCALCITON, LATICACIDVEN in the last 168 hours.  No results found for this or any previous visit (from the past 240 hours).   Radiology Studies: No results found.  Scheduled Meds:  ARIPiprazole   15 mg Oral QPM   aspirin  EC  81 mg Oral QHS   atorvastatin   20 mg Oral Daily   colchicine   0.6 mg Oral Daily   docusate sodium   100 mg Oral BID   donepezil   10 mg Oral QHS   DULoxetine   60 mg Oral BID   emtricitabine -tenofovir  AF  1 tablet Oral Daily   enoxaparin  (LOVENOX ) injection  40 mg Subcutaneous Q24H   famotidine   40 mg Oral QHS   fluticasone   2 spray Each Nare QHS   furosemide   20 mg Oral QODAY   gabapentin   300 mg Oral TID   insulin  aspart  0-15 Units Subcutaneous TID WC   linezolid   600 mg Oral Q12H   lipase/protease/amylase  36,000 Units Oral TID with meals   methocarbamol   500 mg Oral Q8H   pantoprazole   40 mg Oral TID   polyethylene glycol  17 g Oral Daily   prasugrel   5 mg Oral Daily   tamsulosin   0.4 mg Oral BID   testosterone   5 g Transdermal Daily   traZODone   150 mg Oral QHS   zolpidem   10 mg Oral QHS   Continuous Infusions:   LOS: 9 days    Time spent: 35 mins    Darcel Dawley, MD [facility]   If 7PM-7AM, please contact night-coverage

## 2024-02-04 NOTE — NC FL2 (Signed)
 [place]  MEDICAID FL2 LEVEL OF CARE FORM     IDENTIFICATION  Patient Name: Travis KLAS Sr. Birthdate: 12-28-1950 Sex: male Admission Date (Current Location): 01/25/2024  [facility] and [place] Number:  Producer, television/film/video and Address:  [facility] [place], [address], [place], [place] [address]      Provider Number: [id number]  Attending Physician Name and Address:  Harden Jerona GAILS, MD  Relative Name and Phone Number:       Current Level of Care: Hospital Recommended Level of Care: Skilled Nursing Facility Prior Approval Number:    Date Approved/Denied:   PASRR Number: [id number]  Discharge Plan: SNF    Current Diagnoses: Patient Active Problem List   Diagnosis Date Noted   Infection of bursa 01/25/2024   Prepatellar bursitis of right knee 01/25/2024   Abscess in bursa 01/25/2024   Hypotension 12/20/2023   Failed back syndrome of lumbar spine 08/04/2022   Arthropathy of both sacroiliac joints 04/06/2022   Colitis due to enteroinvasive Escherichia coli 11/28/2021   Hypophosphatemia 11/27/2021   BPH (benign prostatic hyperplasia) 11/26/2021   Pancolitis (HCC) 11/26/2021   Pancreatic insufficiency 11/26/2021   Sepsis (HCC) 11/25/2021   Chronic sinusitis 11/23/2021   Hypertension associated with type 2 diabetes mellitus (HCC) 10/25/2021   Foot callus 10/06/2021   Folliculitis 10/06/2021   Central sleep apnea comorbid with prescribed opioid use 08/17/2021   Obstructive sleep apnea on CPAP 08/17/2021   Chronic intermittent hypoxia with obstructive sleep apnea 08/17/2021   Subjective memory complaints 08/17/2021   Word finding difficulty 08/17/2021   Eating disorder 06/28/2021   Abnormal laboratory test 06/21/2021   COVID-19 virus infection 06/03/2021   Arthritis of left shoulder region    AV block, Mobitz 2 03/18/2021   S/P reverse total shoulder arthroplasty, left 03/17/2021   Iliotibial band syndrome of both sides 03/16/2021   Chronic  respiratory failure with hypoxia (HCC) 02/26/2021   Dyspnea 01/14/2021   Nasal turbinate hypertrophy 11/16/2020   Sinusitis 11/02/2020   Memory loss or impairment 10/11/2020   Right arm pain 09/18/2020   Aortic atherosclerosis (HCC) 09/16/2020   Spondylolisthesis, lumbar region    Status post lumbar spinal fusion 07/27/2020   Nasal sinus polyp 06/16/2020   Sacroiliac joint pain 04/02/2020   Vitamin D  deficiency 02/24/2020   Cough 07/29/2019   Wheezing 07/29/2019   Possible exposure to STD 07/24/2019   Unilateral primary osteoarthritis, left hip 06/06/2019   Status post total replacement of left hip 06/06/2019   S/P laparoscopic sleeve gastrectomy 03/03/2019   Rash 01/24/2019   Fever 08/15/2018   UTI (urinary tract infection) 08/15/2018   Fall at home, initial encounter 08/15/2018   Normocytic anemia 08/15/2018   Hypokalemia 08/15/2018   Bowel incontinence 07/25/2018   Other spondylosis with radiculopathy, lumbar region 07/16/2018   Status post lumbar laminectomy 07/16/2018   Status post THR (total hip replacement) 04/17/2018   Unilateral primary osteoarthritis, right hip    Myocardial infarction (HCC) 02/25/2018   Coronary artery disease 02/25/2018   PAD (peripheral artery disease) (HCC) 02/25/2018   S/P BKA (below knee amputation) unilateral, right (HCC) 02/25/2018   Sacroiliitis (HCC) 02/25/2018   SOB (shortness of breath) 01/03/2018   Acute on chronic heart failure (HCC) 01/03/2018   Severe right groin pain 12/20/2017   Morbid obesity (HCC) 10/09/2017   Low back pain 07/13/2017   Leukoplakia, tongue 01/26/2017   Skin lesion 10/20/2016   S/P unilateral BKA (below knee amputation), right (HCC) 06/14/2016  Charcot foot due to diabetes mellitus (HCC)    Charcot's arthropathy associated with type 2 diabetes mellitus (HCC) 04/11/2016   Encounter for well adult exam with abnormal findings 11/05/2015   Major depression 09/13/2015   S/P TKR (total knee replacement)  bilaterally 09/13/2015   GERD (gastroesophageal reflux disease) 09/08/2015   S/P laparoscopic hernia repair 05/11/2015   PPD positive 04/08/2015   Benign neoplasm of descending colon    Benign neoplasm of cecum    Acute blood loss as cause of postoperative anemia    Chronic anticoagulation    Occult blood positive stool 10/17/2014   General weakness 07/14/2014   Urinary incontinence 07/14/2014   Controlled diabetes mellitus with hyperglycemia (HCC) 05/06/2014   Hypotension during surgery 01/30/2014   Headache 10/15/2013   Spinal stenosis in cervical region 09/26/2013   Congenital spinal stenosis of lumbar region 09/26/2013   Hand joint pain 06/10/2013   Rotator cuff tear arthropathy of both shoulders 06/10/2013   Skin lesion of cheek 05/01/2013   Pain of right thumb 04/03/2013   Balance disorder 03/12/2013   Gait disorder 03/12/2013   Tremor 03/12/2013   Left hip pain 03/12/2013   Pre-ulcerative corn or callous 02/06/2013   Anxiety 11/12/2011   OSA on CPAP 11/07/2011   Bradycardia 10/20/2011   Insomnia 10/04/2011   Obesity 01/12/2011   Nausea and vomiting 09/28/2010   Type 2 diabetes mellitus with obesity (HCC) 09/27/2010   ERECTILE DYSFUNCTION, ORGANIC 05/30/2010   Degenerative disc disease, lumbar 04/19/2010   SCIATICA, LEFT 04/19/2010   Chronic pain syndrome 10/27/2009   Hyperlipidemia 07/15/2009   Essential hypertension 06/24/2009   Coronary artery disease involving native coronary artery of native heart without angina pectoris 06/24/2009   Allergic rhinitis 06/24/2009   Urethral stricture 06/24/2009   Osteoarthritis 06/24/2009   SHOULDER PAIN, BILATERAL 06/24/2009   Physical deconditioning 06/24/2009   NEPHROLITHIASIS, HX OF 06/24/2009    Orientation RESPIRATION BLADDER Height & Weight     Self, Time, Place  Normal Continent Weight: 197 lb 5 oz (89.5 kg) Height:  6' (182.9 cm)  BEHAVIORAL SYMPTOMS/MOOD NEUROLOGICAL BOWEL NUTRITION STATUS      Incontinent Diet  (see discharge summary)  AMBULATORY STATUS COMMUNICATION OF NEEDS Skin   Total Care Verbally Other (Comment) (scratches, redness)                       Personal Care Assistance Level of Assistance  Bathing, Feeding, Dressing Bathing Assistance: Limited assistance Feeding assistance: Limited assistance Dressing Assistance: Maximum assistance     Functional Limitations Info  Sight, Hearing, Speech Sight Info: Adequate Hearing Info: Adequate Speech Info: Adequate    SPECIAL CARE FACTORS FREQUENCY  PT (By licensed PT), OT (By licensed OT)     PT Frequency: 5x week OT Frequency: 5x week            Contractures Contractures Info: Not present    Additional Factors Info  Code Status, Allergies, Insulin  Sliding Scale Code Status Info: full Allergies Info: Nicotine, Buprenorphine , Neosporin (Bacitracin -polymyxin B)   Insulin  Sliding Scale Info: Novolog : see discharge summary       Current Medications (02/04/2024):  This is the current hospital active medication list Current Facility-Administered Medications  Medication Dose Route Frequency Provider Last Rate Last Admin   acetaminophen  (TYLENOL ) tablet 325-650 mg  325-650 mg Oral Q6H PRN Gerome Maurilio HERO, PA-C   650 mg at 02/03/24 2138   ARIPiprazole  (ABILIFY ) tablet 15 mg  15 mg Oral QPM Gerome Maurilio  M, PA-C   15 mg at 02/03/24 1757   aspirin  EC tablet 81 mg  81 mg Oral QHS Gerome Maurilio HERO, PA-C   81 mg at 02/03/24 2139   atorvastatin  (LIPITOR) tablet 20 mg  20 mg Oral Daily Gerome Maurilio M, PA-C   20 mg at 02/04/24 0818   bisacodyl  (DULCOLAX) EC tablet 5 mg  5 mg Oral Daily PRN Harden Jerona GAILS, MD   5 mg at 01/28/24 1255   colchicine  tablet 0.6 mg  0.6 mg Oral Daily Duda, Marcus V, MD   0.6 mg at 02/04/24 9185   docusate sodium  (COLACE) capsule 100 mg  100 mg Oral BID Gerome Maurilio M, PA-C   100 mg at 01/31/24 1100   donepezil  (ARICEPT ) tablet 10 mg  10 mg Oral QHS Gerome Maurilio M, PA-C   10 mg at 02/03/24 2139    DULoxetine  (CYMBALTA ) DR capsule 60 mg  60 mg Oral BID Gerome Maurilio HERO, PA-C   60 mg at 02/04/24 9182   emtricitabine -tenofovir  AF (DESCOVY ) 200-25 MG per tablet 1 tablet  1 tablet Oral Daily Gerome Maurilio HERO, PA-C   1 tablet at 02/04/24 9185   enoxaparin  (LOVENOX ) injection 40 mg  40 mg Subcutaneous Q24H Gerome Maurilio M, PA-C   40 mg at 02/03/24 1757   famotidine  (PEPCID ) tablet 40 mg  40 mg Oral QHS Gerome Maurilio M, PA-C   40 mg at 02/03/24 2138   fluticasone  (FLONASE ) 50 MCG/ACT nasal spray 2 spray  2 spray Each Nare QHS Gerome Maurilio HERO, PA-C   2 spray at 02/03/24 2139   furosemide  (LASIX ) tablet 20 mg  20 mg Oral QODAY Khatri, Pardeep, MD   20 mg at 02/04/24 9182   gabapentin  (NEURONTIN ) capsule 300 mg  300 mg Oral TID Duda, Marcus V, MD   300 mg at 02/04/24 9182   HYDROmorphone  (DILAUDID ) injection 0.5 mg  0.5 mg Intravenous Q4H PRN Seena Marsa NOVAK, MD       insulin  aspart (novoLOG ) injection 0-15 Units  0-15 Units Subcutaneous TID WC Gerome Maurilio HERO, PA-C   2 Units at 02/02/24 9348   linezolid  (ZYVOX ) tablet 600 mg  600 mg Oral Q12H Jimenez, Aileen, RPH   600 mg at 02/04/24 9185   lipase/protease/amylase (CREON ) capsule 24,000 Units  24,000 Units Oral Q1H PRN Vernetta Lonni GRADE, MD   24,000 Units at 01/29/24 2058   lipase/protease/amylase (CREON ) capsule 36,000 Units  36,000 Units Oral TID with meals Gerome Maurilio HERO, PA-C   36,000 Units at 02/04/24 9182   methocarbamol  (ROBAXIN ) tablet 500 mg  500 mg Oral Q8H Collins, Emma M, PA-C   500 mg at 02/04/24 9549   metoCLOPramide  (REGLAN ) tablet 5-10 mg  5-10 mg Oral Q8H PRN Gerome Maurilio M, PA-C       Or   metoCLOPramide  (REGLAN ) injection 5-10 mg  5-10 mg Intravenous Q8H PRN Gerome Maurilio M, PA-C       ondansetron  (ZOFRAN ) tablet 4 mg  4 mg Oral Q6H PRN Gerome Maurilio M, PA-C       Or   ondansetron  (ZOFRAN ) injection 4 mg  4 mg Intravenous Q6H PRN Gerome Maurilio HERO, PA-C       Oral care mouth rinse  15 mL Mouth Rinse PRN Harden Jerona GAILS, MD        oxyCODONE  (Oxy IR/ROXICODONE ) immediate release tablet 10 mg  10 mg Oral Q4H PRN Melvin, Alexander B, MD   10 mg at 02/04/24 1059   pantoprazole  (  PROTONIX ) EC tablet 40 mg  40 mg Oral TID Gerome Maurilio HERO, PA-C   40 mg at 02/04/24 9182   polyethylene glycol (MIRALAX  / GLYCOLAX ) packet 17 g  17 g Oral Daily Duda, Marcus V, MD   17 g at 01/31/24 1100   prasugrel  (EFFIENT ) tablet 5 mg  5 mg Oral Daily Gerome Maurilio M, PA-C   5 mg at 02/04/24 9180   senna-docusate (Senokot-S) tablet 1 tablet  1 tablet Oral QHS PRN Duda, Marcus V, MD   1 tablet at 01/27/24 2109   tamsulosin  (FLOMAX ) capsule 0.4 mg  0.4 mg Oral BID Gerome Maurilio M, PA-C   0.4 mg at 02/04/24 9185   testosterone  (ANDROGEL ) 50 MG/5GM (1%) gel 5 g  5 g Transdermal Daily Seena Marsa NOVAK, MD   5 g at 02/04/24 0818   traZODone  (DESYREL ) tablet 150 mg  150 mg Oral QHS Gerome Maurilio M, PA-C   150 mg at 02/03/24 2138   zolpidem  (AMBIEN ) tablet 10 mg  10 mg Oral QHS Gerome Maurilio M, PA-C   10 mg at 02/03/24 2138     Discharge Medications: Please see discharge summary for a list of discharge medications.  Relevant Imaging Results:  Relevant Lab Results:   Additional Information ssn: [id number]  Bridget Cordella Simmonds, LCSW

## 2024-02-05 LAB — GLUCOSE, CAPILLARY
Glucose-Capillary: 92 mg/dL (ref 70–99)
Glucose-Capillary: 90 mg/dL (ref 70–99)

## 2024-02-05 MED ORDER — OXYCODONE-ACETAMINOPHEN 10-325 MG PO TABS: 1.0000 | ORAL_TABLET | Freq: Four times a day (QID) | ORAL | 0 refills | Status: AC | PRN

## 2024-02-05 NOTE — Plan of Care (Signed)
  Problem: Education: Goal: Knowledge of General Education information will improve Description: Including pain rating scale, medication(s)/side effects and non-pharmacologic comfort measures 02/05/2024 0728 by Ann Axel HERO, RN Outcome: Progressing 02/05/2024 0727 by Ann Axel HERO, RN Outcome: Progressing   Problem: Health Behavior/Discharge Planning: Goal: Ability to manage health-related needs will improve 02/05/2024 0728 by Ann Axel HERO, RN Outcome: Progressing 02/05/2024 0727 by Ann Axel HERO, RN Outcome: Progressing

## 2024-02-05 NOTE — Progress Notes (Signed)
 Occupational Therapy Treatment Patient Details Name: Travis HUSTED Sr. MRN: [id number] DOB: December 07, 1950 Today's Date: 02/05/2024   History of present illness Travis Berthelot. is a 73 y.o. male admitted on 01/25/24 for R knee infection, s/p excision of a infected prepatellar bursa right knee. PMH significant for BPH, Left reverse total shoulder arthroplasty 2022, L THA 2021, s/p R BKA 2019, lumbar laminectomy 2020, PAD, B TKA 2017.   OT comments  Pt progressing toward established OT goals. Challenging strength, balance, BADL, endurance. Pt with fair sitting tolerance/balance, but fatigues with posterior lean benefiting from ~4 rest breaks with arm support during session. Pt able to don/doff L sock with AE with min A after education. Performed STS with stedy without RLE prosthesis donned 4x and provided pericare and mepilex to bottom with RN notified. Will continue to follow. Patient will benefit from continued inpatient follow up therapy, <3 hours/day       If plan is discharge home, recommend the following:  A lot of help with walking and/or transfers;A lot of help with bathing/dressing/bathroom;Assistance with cooking/housework;Assist for transportation;Help with stairs or ramp for entrance   Equipment Recommendations  BSC/3in1;Other (comment) (drop arm)    Recommendations for Other Services      Precautions / Restrictions Precautions Precautions: Fall Recall of Precautions/Restrictions: Intact Restrictions Weight Bearing Restrictions Per Provider Order: Yes RLE Weight Bearing Per Provider Order: Weight bearing as tolerated Other Position/Activity Restrictions: Per ortho if can get on prosthetic can be WBAT       Mobility Bed Mobility Overal bed mobility: Needs Assistance Bed Mobility: Supine to Sit, Sit to Supine     Supine to sit: Mod assist Sit to supine: Mod assist, +2 for safety/equipment   General bed mobility comments: increased timr to bring BLE toward EOB and  then assist for truncal elevation and to scoot R hip out toward EOB sitting up on L EOB. mod A for repositioning at end of session once supine and bringing BLE fully into bed    Transfers Overall transfer level: Needs assistance Equipment used: Ambulation equipment used Transfers: Sit to/from Stand Sit to Stand: From elevated surface, Mod assist, Via lift equipment           General transfer comment: fair power up but poor hip extension with prosthesis not donned     Balance Overall balance assessment: Needs assistance Sitting-balance support: No upper extremity supported, Bilateral upper extremity supported Sitting balance-Leahy Scale: Poor Sitting balance - Comments: posterior lean with faitigue and when attempting to come EOB notable core weakness, unable to maintain upright without asisst until hips square to EOB                                   ADL either performed or assessed with clinical judgement   ADL Overall ADL's : Needs assistance/impaired     Grooming: Contact guard assist;Sitting Grooming Details (indicate cue type and reason): EOB             Lower Body Dressing: Minimal assistance;Sitting/lateral leans;With adaptive equipment Lower Body Dressing Details (indicate cue type and reason): educated re: use of dressing stick and sock aide. pt needing light assist for technique and close guard to min A for sitting balance during BUE portions of task Toilet Transfer: Moderate assistance Toilet Transfer Details (indicate cue type and reason): STS with stedy Toileting- Clothing Manipulation and Hygiene: Total assistance;Sit to/from stand;+2 for safety/equipment  Extremity/Trunk Assessment Upper Extremity Assessment Upper Extremity Assessment: RUE deficits/detail;LUE deficits/detail RUE Deficits / Details: decr shoulder ROM with AAROM to place RUE on stedy, pain LUE Deficits / Details: prior shoulder surgery, c/o severe elbow  painwith PROM/AROM, painful to touch at humerus, motor control and sensation intact but barely able to perform AROM to hand/wrist/elbow without severe pain.   Lower Extremity Assessment Lower Extremity Assessment: Defer to PT evaluation        Vision       Perception     Praxis     Communication Communication Communication: No apparent difficulties   Cognition Arousal: Alert Behavior During Therapy: WFL for tasks assessed/performed Cognition: No apparent impairments                               Following commands: Impaired Following commands impaired: Follows one step commands with increased time, Follows multi-step commands inconsistently      Cueing   Cueing Techniques: Verbal cues, Gestural cues, Tactile cues  Exercises Exercises: Other exercises Other Exercises Other Exercises: STS x4 Other Exercises: glute sets x10 Other Exercises: quad sets from supine x10    Shoulder Instructions       General Comments bottom raw with open wounds; barrier cream donned as well as mepilex/sacral foam.    Pertinent Vitals/ Pain       Pain Assessment Pain Assessment: Faces Faces Pain Scale: Hurts a little bit Pain Location: R shoulder and entire L leg Pain Descriptors / Indicators: Grimacing, Discomfort, Aching, Sore, Tender Pain Intervention(s): Limited activity within patient's tolerance, Monitored during session  Home Living                                          Prior Functioning/Environment              Frequency  Min 2X/week        Progress Toward Goals  OT Goals(current goals can now be found in the care plan section)  Progress towards OT goals: Progressing toward goals  Acute Rehab OT Goals Patient Stated Goal: get better OT Goal Formulation: With patient Time For Goal Achievement: 02/12/24 Potential to Achieve Goals: Good ADL Goals Pt Will Perform Lower Body Dressing: with supervision;sitting/lateral leans Pt  Will Transfer to Toilet: with supervision  Plan      Co-evaluation                 AM-PAC OT 6 Clicks Daily Activity     Outcome Measure   Help from another person eating meals?: A Little Help from another person taking care of personal grooming?: A Little Help from another person toileting, which includes using toliet, bedpan, or urinal?: A Lot Help from another person bathing (including washing, rinsing, drying)?: A Lot Help from another person to put on and taking off regular upper body clothing?: A Little Help from another person to put on and taking off regular lower body clothing?: A Lot 6 Click Score: 15    End of Session Equipment Utilized During Treatment: Gait belt;Other (comment) (stedy)  OT Visit Diagnosis: Unsteadiness on feet (R26.81);Muscle weakness (generalized) (M62.81);Pain Pain - Right/Left: Right Pain - part of body: Shoulder   Activity Tolerance Patient limited by pain   Patient Left in bed;with call bell/phone within reach;with bed alarm set   Nurse Communication Mobility status;Other (comment) (  BM, weepy stool continued.)        Time: 9184-9147 OT Time Calculation (min): 37 min  Charges: OT General Charges $OT Visit: 1 Visit OT Treatments $Self Care/Home Management : 23-37 mins  Travis [name], OTR/L [facility] Acute Rehabilitation Office: [phone]   Travis [name] 02/05/2024, 9:07 AM

## 2024-02-05 NOTE — TOC Progression Note (Addendum)
 Transition of Care (TOC) - Progression Note    Patient Details  Name: Travis HEDEEN Sr. MRN: [id number] Date of Birth: [birth year]  [facility] of [facility]) CM/SW Contact  Bridget Cordella Simmonds, LCSW Phone Number: 02/05/2024, 8:26 AM  Clinical Narrative:   [facility] shara remains pending.    [id number] [facility] approved 9/16 - 9/22 NRD 0/76 Rzmu#[id number].  CSW confirmed with [facility] that they can receive pt today.   Expected Discharge Plan: Home w Home Health Services Barriers to Discharge: Continued Medical Work up               Expected Discharge Plan and Services     Post Acute Care Choice: Resumption of Svcs/PTA Provider, Home Health   Expected Discharge Date: 02/05/24                         HH Arranged: RN, PT, OT HH Agency: [facility] Date [facility] Agency Contacted: 01/30/24 Time HH Agency Contacted: [phone] Representative spoke with at [facility] Agency: [place]   Social Drivers of Health (SDOH) Interventions SDOH Screenings   Food Insecurity: No Food Insecurity (01/25/2024)  Housing: Low Risk  (01/25/2024)  Transportation Needs: No Transportation Needs (01/25/2024)  Utilities: Not At Risk (01/25/2024)  Depression (PHQ2-9): Medium Risk (08/15/2023)  Financial Resource Strain: High Risk (10/10/2022)   Received from [facility]  Physical Activity: Unknown (10/10/2022)   Received from [facility]  Social Connections: Socially Isolated (01/25/2024)  Stress: Stress Concern Present (10/10/2022)   Received from [facility]  Tobacco Use: Medium Risk (01/25/2024)    Readmission Risk Interventions     No data to display
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
## 2024-02-05 NOTE — Plan of Care (Signed)
   Problem: Education: Goal: Knowledge of General Education information will improve Description Including pain rating scale, medication(s)/side effects and non-pharmacologic comfort measures Outcome: Progressing   Problem: Health Behavior/Discharge Planning: Goal: Ability to manage health-related needs will improve Outcome: Progressing

## 2024-02-05 NOTE — Progress Notes (Signed)
 Travis FORBES [place] Sr. to be discharged to [facility] per MD order. Report called to [employer], LPN at [place]. Skin clean and dry, stump shrinker in place to R leg. Rollator, prosthetic, and other belongings all sent with PTAR. IV catheter discontinued intact. Site without signs and symptoms of complications. Dressing and pressure applied.  An After Visit Summary was printed and given to the patient.  Patient escorted via stretcher, and discharged via PTAR.  [name]  02/05/2024

## 2024-02-05 NOTE — TOC Transition Note (Signed)
 Transition of Care [facility]) - Discharge Note   Patient Details  Name: Travis [name]. MRN: [id number] Date of Birth: 25-Nov-1950  Transition of Care [facility]) CM/SW Contact:  Bridget Cordella Simmonds, LCSW Phone Number: 02/05/2024, 11:29 AM   Clinical Narrative:   Pt discharging to [facility], room 210.  RN call report to [phone].  PTAR called [phone].     Final next level of care: Skilled Nursing Facility Barriers to Discharge: Barriers Resolved   Patient Goals and CMS Choice            Discharge Placement              Patient chooses bed at: [facility] Patient to be transferred to facility by: ptar Name of family member notified: left message with son Melecio Cueto Patient and family notified of of transfer: 02/05/24  Discharge Plan and Services Additional resources added to the After Visit Summary for       Post Acute Care Choice: Resumption of Svcs/PTA Provider, Home Health                    HH Arranged: RN, PT, OT [facility] Agency: [facility] Date [facility] Agency Contacted: 01/30/24 Time HH Agency Contacted: [phone] Representative spoke with at [facility] Agency: [place]  Social Drivers of Health (SDOH) Interventions SDOH Screenings   Food Insecurity: No Food Insecurity (01/25/2024)  Housing: Low Risk  (01/25/2024)  Transportation Needs: No Transportation Needs (01/25/2024)  Utilities: Not At Risk (01/25/2024)  Depression (PHQ2-9): Medium Risk (08/15/2023)  Financial Resource Strain: High Risk (10/10/2022)   Received from [facility]  Physical Activity: Unknown (10/10/2022)   Received from [facility]  Social Connections: Socially Isolated (01/25/2024)  Stress: Stress Concern Present (10/10/2022)   Received from [facility]  Tobacco Use: Medium Risk (01/25/2024)     Readmission Risk Interventions     No data to display

## 2024-02-11 ENCOUNTER — Telehealth: Payer: Self-pay | Admitting: Gastroenterology

## 2024-02-11 MED ORDER — ZENPEP 40000-126000 UNITS PO CPEP: ORAL_CAPSULE | ORAL | 1 refills | Status: DC

## 2024-02-11 NOTE — Telephone Encounter (Signed)
 Message to Dr. Leigh requesting refills of ZenPep .  Patient hasn't been seen since 2023

## 2024-02-11 NOTE — Telephone Encounter (Signed)
 Inbound call from patient called stating that he had to reschedule his appointment due to him being admitted into the hospital. Patient is requesting to have a temporary refill until November the 14 th. Please advise.

## 2024-02-11 NOTE — Telephone Encounter (Signed)
 Error

## 2024-02-11 NOTE — Telephone Encounter (Signed)
 Refills sent to pharmacy to get to Nov appointment.

## 2024-02-11 NOTE — Addendum Note (Signed)
 Addended by: Alexx Mcburney M on: 02/11/2024 01:12 PM   Modules accepted: Orders

## 2024-02-11 NOTE — Telephone Encounter (Signed)
 Can refill Zenpep  to get him through until his next appointment with us  in November. Thanks

## 2024-02-13 ENCOUNTER — Ambulatory Visit: Admitting: Physician Assistant

## 2024-02-18 ENCOUNTER — Ambulatory Visit (INDEPENDENT_AMBULATORY_CARE_PROVIDER_SITE_OTHER): Admitting: Orthopedic Surgery

## 2024-02-18 DIAGNOSIS — S81001A Unspecified open wound, right knee, initial encounter: Secondary | ICD-10-CM

## 2024-02-19 ENCOUNTER — Encounter: Payer: Self-pay | Admitting: Orthopedic Surgery
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
## 2024-03-04 ENCOUNTER — Ambulatory Visit (INDEPENDENT_AMBULATORY_CARE_PROVIDER_SITE_OTHER): Admitting: Physician Assistant

## 2024-03-04 ENCOUNTER — Encounter: Payer: Self-pay | Admitting: Physician Assistant

## 2024-03-04 DIAGNOSIS — L89890 Pressure ulcer of other site, unstageable: Secondary | ICD-10-CM

## 2024-03-04 DIAGNOSIS — R531 Weakness: Secondary | ICD-10-CM
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
## 2024-03-05 ENCOUNTER — Emergency Department (HOSPITAL_COMMUNITY): Admission: EM | Admit: 2024-03-05 | Discharge: 2024-03-07 | Disposition: A | Discharge status: Home / Self Care

## 2024-03-05 ENCOUNTER — Encounter (HOSPITAL_COMMUNITY): Payer: Self-pay

## 2024-03-05 ENCOUNTER — Telehealth: Payer: Self-pay | Admitting: Orthopedic Surgery

## 2024-03-05 ENCOUNTER — Emergency Department (HOSPITAL_COMMUNITY)

## 2024-03-05 DIAGNOSIS — Z79899 Other long term (current) drug therapy: Secondary | ICD-10-CM | POA: Insufficient documentation

## 2024-03-05 DIAGNOSIS — E119 Type 2 diabetes mellitus without complications: Secondary | ICD-10-CM | POA: Insufficient documentation

## 2024-03-05 DIAGNOSIS — I10 Essential (primary) hypertension: Secondary | ICD-10-CM | POA: Diagnosis not present

## 2024-03-05 DIAGNOSIS — Z7982 Long term (current) use of aspirin: Secondary | ICD-10-CM | POA: Diagnosis not present

## 2024-03-05 DIAGNOSIS — L03116 Cellulitis of left lower limb: Secondary | ICD-10-CM | POA: Insufficient documentation

## 2024-03-05 DIAGNOSIS — R531 Weakness: Secondary | ICD-10-CM | POA: Diagnosis not present

## 2024-03-05 DIAGNOSIS — L02416 Cutaneous abscess of left lower limb: Secondary | ICD-10-CM | POA: Insufficient documentation

## 2024-03-05 DIAGNOSIS — I2510 Atherosclerotic heart disease of native coronary artery without angina pectoris: Secondary | ICD-10-CM | POA: Diagnosis not present

## 2024-03-05 DIAGNOSIS — Z955 Presence of coronary angioplasty implant and graft: Secondary | ICD-10-CM | POA: Diagnosis not present

## 2024-03-05 DIAGNOSIS — R627 Adult failure to thrive: Secondary | ICD-10-CM | POA: Diagnosis present

## 2024-03-05 LAB — CBC WITH DIFFERENTIAL/PLATELET
Abs Immature Granulocytes: 0.01 K/uL (ref 0.00–0.07)
Basophils Absolute: 0 K/uL (ref 0.0–0.1)
Basophils Relative: 1 %
Eosinophils Absolute: 0 K/uL (ref 0.0–0.5)
Eosinophils Relative: 1 %
HCT: 32.8 % — ABNORMAL LOW (ref 39.0–52.0)
Hemoglobin: 11 g/dL — ABNORMAL LOW (ref 13.0–17.0)
Immature Granulocytes: 0 %
Lymphocytes Relative: 37 %
Lymphs Abs: 1.1 K/uL (ref 0.7–4.0)
MCH: 28.7 pg (ref 26.0–34.0)
MCHC: 33.5 g/dL (ref 30.0–36.0)
MCV: 85.6 fL (ref 80.0–100.0)
Monocytes Absolute: 0.3 K/uL (ref 0.1–1.0)
Monocytes Relative: 11 %
Neutro Abs: 1.5 K/uL — ABNORMAL LOW (ref 1.7–7.7)
Neutrophils Relative %: 50 %
Platelets: 225 K/uL (ref 150–400)
RBC: 3.83 MIL/uL — ABNORMAL LOW (ref 4.22–5.81)
RDW: 14.1 % (ref 11.5–15.5)
WBC: 2.9 K/uL — ABNORMAL LOW (ref 4.0–10.5)
nRBC: 0 % (ref 0.0–0.2)

## 2024-03-05 LAB — COMPREHENSIVE METABOLIC PANEL WITH GFR
ALT: 11 U/L (ref 0–44)
AST: 14 U/L — ABNORMAL LOW (ref 15–41)
Albumin: 3.6 g/dL (ref 3.5–5.0)
Alkaline Phosphatase: 105 U/L (ref 38–126)
Anion gap: 17 — ABNORMAL HIGH (ref 5–15)
BUN: 14 mg/dL (ref 8–23)
CO2: 22 mmol/L (ref 22–32)
Calcium: 8.9 mg/dL (ref 8.9–10.3)
Chloride: 99 mmol/L (ref 98–111)
Creatinine, Ser: 0.97 mg/dL (ref 0.61–1.24)
GFR, Estimated: >60 mL/min (ref >60)
Glucose, Bld: 85 mg/dL (ref 70–99)
Potassium: 3.5 mmol/L (ref 3.5–5.1)
Sodium: 138 mmol/L (ref 135–145)
Total Bilirubin: 1.7 mg/dL — ABNORMAL HIGH (ref 0.0–1.2)
Total Protein: 6.2 g/dL — ABNORMAL LOW (ref 6.5–8.1)

## 2024-03-05 LAB — URINALYSIS, ROUTINE W REFLEX MICROSCOPIC
Bilirubin Urine: NEGATIVE
Glucose, UA: NEGATIVE mg/dL
Ketones, ur: 80 mg/dL — AB
Nitrite: NEGATIVE
Protein, ur: 30 mg/dL — AB
Specific Gravity, Urine: 1.02 (ref 1.005–1.030)
pH: 5 (ref 5.0–8.0)

## 2024-03-05 LAB — I-STAT CG4 LACTIC ACID, ED: Lactic Acid, Venous: 0.6 mmol/L (ref 0.5–1.9)

## 2024-03-05 MED ORDER — ATORVASTATIN CALCIUM 10 MG PO TABS
20.0000 mg | ORAL_TABLET | Freq: Every evening | ORAL | Status: AC
Start: 2024-03-05 — End: ?
  Administered 2024-03-05 – 2024-03-06 (×2): 20 mg via ORAL
  Filled 2024-03-05: qty 1
  Filled 2024-03-05: qty 2

## 2024-03-05 MED ORDER — METHOCARBAMOL 500 MG PO TABS
500.0000 mg | ORAL_TABLET | Freq: Three times a day (TID) | ORAL | Status: DC | PRN
  Administered 2024-03-06 (×2): 500 mg via ORAL
  Filled 2024-03-05 (×2): qty 1

## 2024-03-05 MED ORDER — CEPHALEXIN 250 MG PO CAPS
500.0000 mg | ORAL_CAPSULE | Freq: Four times a day (QID) | ORAL | Status: DC
  Administered 2024-03-06 – 2024-03-07 (×6): 500 mg via ORAL
  Filled 2024-03-05 (×6): qty 2

## 2024-03-05 MED ORDER — CEPHALEXIN 250 MG PO CAPS
500.0000 mg | ORAL_CAPSULE | Freq: Once | ORAL | Status: AC
  Administered 2024-03-05: 500 mg via ORAL
  Filled 2024-03-05: qty 2

## 2024-03-05 MED ORDER — FUROSEMIDE 20 MG PO TABS
20.0000 mg | ORAL_TABLET | ORAL | Status: DC
  Administered 2024-03-05 – 2024-03-07 (×2): 20 mg via ORAL
  Filled 2024-03-05 (×2): qty 1

## 2024-03-05 MED ORDER — PANCRELIPASE (LIP-PROT-AMYL) 36000-114000 UNITS PO CPEP
108000.0000 [IU] | ORAL_CAPSULE | Freq: Three times a day (TID) | ORAL | Status: DC
  Administered 2024-03-06 – 2024-03-07 (×5): 108000 [IU] via ORAL
  Filled 2024-03-05 (×9): qty 3

## 2024-03-05 MED ORDER — PANTOPRAZOLE SODIUM 40 MG PO TBEC
40.0000 mg | DELAYED_RELEASE_TABLET | Freq: Two times a day (BID) | ORAL | Status: AC
Start: 2024-03-05 — End: ?
  Administered 2024-03-05 – 2024-03-07 (×4): 40 mg via ORAL
  Filled 2024-03-05 (×4): qty 1

## 2024-03-05 MED ORDER — ACETAMINOPHEN 325 MG PO TABS
650.0000 mg | ORAL_TABLET | ORAL | Status: DC | PRN
  Administered 2024-03-06: 650 mg via ORAL
  Filled 2024-03-05: qty 2

## 2024-03-05 MED ORDER — PRASUGREL HCL 10 MG PO TABS
5.0000 mg | ORAL_TABLET | Freq: Every day | ORAL | Status: DC
  Administered 2024-03-06 – 2024-03-07 (×2): 5 mg via ORAL
  Filled 2024-03-05 (×3): qty 1

## 2024-03-05 NOTE — ED Notes (Signed)
 Pt tolerate straight cath well urine sample sent. Pt reports having to straight cath at home sometimes due to a blockage. Pt linied changed and placed in clean brief.

## 2024-03-05 NOTE — ED Notes (Signed)
 Pt called out to have a bowel movement, NT assist pt on bed pan instructed pt to call out when finished

## 2024-03-05 NOTE — ED Notes (Signed)
 Pt informed we need urine sample. Urinal provided

## 2024-03-05 NOTE — Telephone Encounter (Signed)
 Do you know anything about this? Is this pt having surgery? I did not talk with him yesterday so I am not sure what he is needing. Do either of you know what he is asking?

## 2024-03-05 NOTE — ED Provider Notes (Signed)
 [place] EMERGENCY DEPARTMENT AT [facility] Provider Note   CSN: [id number] Arrival date & time: 03/05/24  1452     Patient presents with: Failure To Thrive   Travis [name]. is a 73 y.o. male.   Patient with history of CAD, hypertension, GERD, hyperlipidemia, MI s/p CABG, T2DM, right BKA presents today from home with complaints of weakness. Reports that he was recently admitted to the hospital and bursa abscess of his right BKA. He was discharged to [facility] rehab afterwards, and reports he was told he wasn't ready to leave but he decided he wanted to go home anyway. Since arriving home he has been unable to care for himself or perform ADLs. States he has fallen 8 times as well. Denies any injuries from the fall, states he mostly just slides backwards onto his bottom. He is unable to get himself up after this and has therefore been calling EMS for lifting assistance. Reports he does not have anyone to help him at home. He tried to get back into [facility], however reports they wouldn't accept him. Does state that his stump wound has become more painful which is making it difficult for him to wear his prosthesis. Also reports that his left lower leg is more red than normal. Denies fevers or chills. No headaches, vision changes, cough, congestion, chest pain, shortness of breath, nausea, vomiting, diarrhea, or abdominal pain. No urinary symptoms. He is not currently on antibiotics.  The history is provided by the patient. No language interpreter was used.          Prior to Admission medications   Medication Sig Start Date End Date Taking? Authorizing Provider  ARIPiprazole  (ABILIFY ) 15 MG tablet Take 15 mg by mouth every evening. 08/02/23   [provider]  aspirin  EC 81 MG tablet Take 81 mg by mouth at bedtime. Swallow whole.    [provider]  atorvastatin  (LIPITOR) 20 MG tablet TAKE ONE TABLET BY MOUTH EVERY DAY Patient taking differently: Take 20  mg by mouth every evening. 03/08/23   Verlin Lonni BIRCH, MD  celecoxib  (CELEBREX ) 200 MG capsule Take 1 capsule (200 mg total) by mouth 2 (two) times daily. 07/04/21   Nitka, James E, MD  clonazePAM  (KLONOPIN ) 0.5 MG tablet Take 0.25 mg by mouth 2 (two) times daily as needed for anxiety. 12/31/23   [provider]  donepezil  (ARICEPT ) 10 MG tablet Take 1 tablet (10 mg total) by mouth at bedtime. 08/07/22   Norleen Lynwood ORN, MD  DULoxetine  (CYMBALTA ) 60 MG capsule Take 60 mg by mouth 2 (two) times daily. 08/02/23   [provider]  emtricitabine -tenofovir  (TRUVADA) 200-300 MG tablet Take 1 tablet by mouth daily. 08/06/23   [provider]  famotidine  (PEPCID ) 40 MG tablet Take 40 mg by mouth at bedtime. 05/10/23   [provider]  fluticasone  (FLONASE ) 50 MCG/ACT nasal spray Place 2 sprays into both nostrils daily as needed for allergies or rhinitis. 05/09/23   [provider]  furosemide  (LASIX ) 20 MG tablet Take 20 mg by mouth every other day. 01/23/24   [provider]  gabapentin  (NEURONTIN ) 600 MG tablet Take 1,200 mg by mouth 3 (three) times daily. Patient not taking: Reported on 01/25/2024    [provider]  methocarbamol  (ROBAXIN ) 500 MG tablet Take 500 mg by mouth every 8 (eight) hours as needed for muscle spasms. 08/11/23   [provider]  MYRBETRIQ  50 MG TB24 tablet Take 50 mg by mouth daily.  01/15/24   [provider]  naloxone TAMMY) nasal spray 4 mg/0.1 mL SMARTSIG:Both Nares 04/23/23   [provider]  Omega-3 Fatty Acids (FISH OIL) 1000 MG CAPS Take 2,000 mg by mouth daily.    [provider]  oxyCODONE -acetaminophen  (PERCOCET) 10-325 MG tablet Take 1 tablet by mouth every 6 (six) hours as needed. 02/05/24   Harden Jerona GAILS, MD  Pancrelipase , Lip-Prot-Amyl, (ZENPEP ) 40000-126000 units CPEP Take 3 capsules with meals and 2 capsule with each snack (2-3 snacks/day). Please keep your November  appointment for further refills 02/11/24   Armbruster, Elspeth SQUIBB, MD  pantoprazole  (PROTONIX ) 40 MG tablet Take 40 mg by mouth 2 (two) times daily.    [provider]  prasugrel  (EFFIENT ) 5 MG TABS tablet Take 1 tablet (5 mg total) by mouth daily. 01/29/24   Verlin Lonni BIRCH, MD  predniSONE  (DELTASONE ) 20 MG tablet Take 20 mg by mouth daily. Patient not taking: Reported on 01/25/2024 07/30/23   [provider]  sildenafil  (VIAGRA ) 50 MG tablet Take 1 tablet (50 mg total) by mouth daily as needed for erectile dysfunction. 01/16/23   Conte, Tessa N, PA-C  silver  sulfADIAZINE  (SILVADENE ) 1 % cream Apply 1 Application topically daily. Patient taking differently: Apply 1 Application topically daily as needed (for burn,scars). 01/26/23   Harden Jerona GAILS, MD  tamsulosin  (FLOMAX ) 0.4 MG CAPS capsule Take 0.4 mg by mouth 2 (two) times daily. Patient not taking: Reported on 01/25/2024 06/30/20   [provider]  Testosterone  1.62 % GEL place two pumps onto THE SKIN EVERY DAY 05/08/22   Norleen Lynwood ORN, MD  tirzepatide  (MOUNJARO ) 7.5 MG/0.5ML Pen Inject 7.5 mg into the skin once a week. Mondays 04/09/23   [provider]  traZODone  (DESYREL ) 150 MG tablet Take 1 tablet (150 mg total) by mouth at bedtime. Patient not taking: Reported on 01/25/2024 06/28/22   Arfeen, Leni DASEN, MD  zolpidem  (AMBIEN ) 10 MG tablet Take 10 mg by mouth at bedtime. 07/24/23   [provider]    Allergies: Nicotine, Buprenorphine , and Neosporin [bacitracin -polymyxin b]    Review of Systems  All other systems reviewed and are negative.   Updated Vital Signs BP (!) 143/73   Pulse 68   Temp 98.7 F (37.1 C)   Resp 10   Ht 6' (1.829 m)   Wt 90.7 kg   SpO2 100%   BMI 27.12 kg/m   Physical Exam Vitals and nursing note reviewed.  Constitutional:      General: He is not in acute distress.    Appearance: Normal appearance. He is normal weight. He is not ill-appearing, toxic-appearing or  diaphoretic.  HENT:     Head: Normocephalic and atraumatic.  Cardiovascular:     Rate and Rhythm: Normal rate and regular rhythm.     Heart sounds: Normal heart sounds.  Pulmonary:     Effort: Pulmonary effort is normal. No respiratory distress.     Breath sounds: Normal breath sounds.  Abdominal:     General: Abdomen is flat.     Palpations: Abdomen is soft.     Tenderness: There is no abdominal tenderness.  Musculoskeletal:        General: Normal range of motion.     Cervical back: Normal range of motion and neck supple.     Comments: RLE BKA with skin erythema, small wound overlying the patella, no drainage, does have foul odor.   LLE with some edema with erythema. DP and PT pulses palpable.  No calf tenderness  See images below for further  Skin:    General: Skin is warm and dry.  Neurological:     General: No focal deficit present.     Mental Status: He is alert and oriented to person, place, and time.  Psychiatric:        Mood and Affect: Mood normal.        Behavior: Behavior normal.     (all labs ordered are listed, but only abnormal results are displayed) Labs Reviewed  CBC WITH DIFFERENTIAL/PLATELET - Abnormal; Notable for the following components:      Result Value   WBC 2.9 (*)    RBC 3.83 (*)    Hemoglobin 11.0 (*)    HCT 32.8 (*)    Neutro Abs 1.5 (*)    All other components within normal limits  COMPREHENSIVE METABOLIC PANEL WITH GFR - Abnormal; Notable for the following components:   Total Protein 6.2 (*)    AST 14 (*)    Total Bilirubin 1.7 (*)    Anion gap 17 (*)    All other components within normal limits  URINALYSIS, ROUTINE W REFLEX MICROSCOPIC - Abnormal; Notable for the following components:   Color, Urine AMBER (*)    APPearance HAZY (*)    Hgb urine dipstick SMALL (*)    Ketones, ur 80 (*)    Protein, ur 30 (*)    Leukocytes,Ua SMALL (*)    Bacteria, UA FEW (*)    All other components within normal limits  I-STAT CG4 LACTIC ACID, ED   I-STAT CG4 LACTIC ACID, ED    EKG: EKG Interpretation Date/Time:  Wednesday March 05 2024 16:15:03 EDT Ventricular Rate:  61 PR Interval:  182 QRS Duration:  123 QT Interval:  470 QTC Calculation: 474 R Axis:   6  Text Interpretation: Sinus rhythm Nonspecific intraventricular conduction delay Confirmed by Neysa Clap [phone]) on 03/05/2024 7:31:40 PM  Radiology: DG Chest Portable 1 View Result Date: 03/05/2024 EXAM: 1 VIEW(S) XRAY OF THE CHEST 03/05/2024 04:01:00 PM COMPARISON: Comparison 12/29/2023 status post left shoulder arthroplasty. CLINICAL HISTORY: weakness. FINDINGS: LUNGS AND PLEURA: No focal pulmonary opacity. No pulmonary edema. No pleural effusion. No pneumothorax. HEART AND MEDIASTINUM: No acute abnormality of the cardiac and mediastinal silhouettes. BONES AND SOFT TISSUES: Status post left shoulder arthroplasty. No acute osseous abnormality. IMPRESSION: 1. No acute cardiopulmonary abnormality detected. Electronically signed by: Lynwood Seip MD 03/05/2024 04:05 PM EDT RP Workstation: [id number]     Procedures   Medications Ordered in the ED  cephALEXin  (KEFLEX ) capsule 500 mg (has no administration in time range)                                    Medical Decision Making Amount and/or Complexity of Data Reviewed Labs: ordered. Radiology: ordered.  Risk OTC drugs. Prescription drug management.   This patient is a 73 y.o. male who presents to the ED for concern of weakness, this involves an extensive number of treatment options, and is a complaint that carries with it a high risk of complications and morbidity. The emergent differential diagnosis prior to evaluation includes, but is not limited to,  CVA, spinal cord injury, ACS, arrhythmia, syncope, orthostatic hypotension, sepsis, hypoglycemia, hypoxia, electrolyte disturbance, endocrine disorder, anemia, environmental exposure, polypharmacy  This is not an exhaustive differential.   Past Medical History  / Co-morbidities / Social History:  has a past medical  history of AKI (acute kidney injury) (10/18/2014), ALLERGIC RHINITIS (06/24/2009), Anemia, Anginal pain, Anxiety (11/12/2011), Arthritis, Arthritis of foot, right, degenerative (04/15/2014), Balance disorder (03/12/2013), Benign neoplasm of cecum, Benign neoplasm of descending colon, Bradycardia, Chest pain, Chronic pain syndrome (10/27/2009), Closed fracture of right foot (10/17/2014), CORONARY ARTERY DISEASE (06/24/2009), DEGENERATIVE JOINT DISEASE (06/24/2009), Depression with anxiety, DISC DISEASE, LUMBAR (04/19/2010), ERECTILE DYSFUNCTION, ORGANIC (05/30/2010), Essential hypertension (06/24/2009), Fibromyalgia, Fracture dislocation of ankle joint (09/02/2015), Gait disorder (03/12/2013), General weakness (07/14/2014), GERD (gastroesophageal reflux disease) (09/08/2015), Hand joint pain (06/10/2013), Hepatitis C, History of kidney stones, right BKA (HCC), Hyperlipidemia (07/15/2009), HYPERTENSION (06/24/2009), Insomnia (10/04/2011), Joint pain, Left hip pain (03/12/2013), Major depression (09/13/2015), Mobitz type 1 second degree AV block, Myocardial infarction (HCC) (2008), Obesity, Occult blood positive stool (10/17/2014), Open ankle fracture (09/02/2015), OSA (obstructive sleep apnea), Osteoarthritis, Pain of right thumb (04/03/2013), Pancreatic disease, Pneumonia, PPD positive (04/08/2015), Pre-ulcerative corn or callous (02/06/2013), Rotator cuff tear arthropathy of both shoulders (06/10/2013), SCIATICA, LEFT (04/19/2010), Sleep apnea, Spinal stenosis in cervical region (09/26/2013), Spinal stenosis, lumbar region, with neurogenic claudication (09/26/2013), Swallowing difficulty, Tinnitus, Type II diabetes mellitus (HCC) (2012), Umbilical hernia, URETHRAL STRICTURE (06/24/2009), and Vitamin D  deficiency.  Additional history: Chart reviewed. Pertinent results include: patient had surgery for his bursa abscess with Dr. Harden on 9/05, discharged on  9/16  Physical Exam: Physical exam performed. The pertinent findings include:   RLE BKA with skin erythema, small wound overlying the patella, no drainage, does have foul odor.   LLE with some edema with erythema. DP and PT pulses palpable. No calf tenderness  See images above for further  Lab Tests: I ordered, and personally interpreted labs.  The pertinent results include:  WBC 2.9 (consistent with previous), hgb 11.0 (consistent with previous), T.bili 1.7, anion gap 17, lactic WNL, UA with ketones, protein, RBCs   Imaging Studies: I ordered imaging studies including CXR. I independently visualized and interpreted imaging which showed NAD. I agree with the radiologist interpretation.   Cardiac Monitoring:  The patient was maintained on a cardiac monitor.  My attending physician viewed and interpreted the cardiac monitored which showed an underlying rhythm of: sinus rhythm, no STEMI. I agree with this interpretation.   Medications: I ordered medication including keflex   for LLE cellulitis. Reevaluation of the patient after these medicines showed that the patient stayed the same. I have reviewed the patients home medicines and have made adjustments as needed.  Consultations Obtained: I requested consultation with the hosptialist Dr. Charlton,  and discussed lab and imaging findings as well as pertinent plan - they recommend: no indication for admission found at this time   Disposition: After consideration of the diagnostic results and the patients response to treatment, I feel that patient will have to board in the ER for placement given his weakness and inability to care for himself at home. Discussed same with patient who is in agreement with this. Home meds ordered, will continue Keflex  for concern for cellulitis of his LLE, however no fever, tachycardia, leukocytosis, lactic acidosis at this time.    I discussed this case with my attending physician Dr. Neysa who cosigned this note  including patient's presenting symptoms, physical exam, and planned diagnostics and interventions. Attending physician stated agreement with plan or made changes to plan which were implemented.    Final diagnoses:  Cellulitis and abscess of left lower extremity  Weakness    ED Discharge Orders     None          Kiarrah Rausch,  Lauraine LABOR, PA-C 03/05/24 [id number]    Neysa Caron PARAS, DO 03/05/24 2344

## 2024-03-05 NOTE — ED Notes (Signed)
 Called phleb to assist X2

## 2024-03-05 NOTE — Telephone Encounter (Signed)
 Pt called stating that his insurance ia saying that Dr. Harden needs to readmit him to the hospital. [facility] can pick him up. Pt call back number is [phone]

## 2024-03-05 NOTE — ED Notes (Signed)
 Made PA aware phleb also unable to obtain blood samples. RN Request IV US  from PA or MD, denied at this time. Will await provider to order IV team or come to bedside to assist in collection.

## 2024-03-05 NOTE — ED Triage Notes (Signed)
 Pt arrived via EMS to ED with CC failure to thrive and less ability to do ADLs' Hx HTN, CABG, BKA

## 2024-03-05 NOTE — ED Notes (Signed)
 Pt moved up in bed, denies any other needs. TV on and call light within reach

## 2024-03-05 NOTE — ED Notes (Signed)
 Pt difficult stick, unable to obtain lab work, MD made aware

## 2024-03-05 NOTE — TOC Initial Note (Signed)
 Transition of Care (TOC) - Initial/Assessment Note    Patient Details  Name: Travis STEELMAN Sr. MRN: [id number] Date of Birth: 12-07-1950  Transition of [facility]) CM/SW Contact:    Hartley KATHEE Robertson, LCSWA Phone Number: 03/05/2024, 10:04 PM  Clinical Narrative:                  CSW received consult for SNF placement, per chart review pt was recently dc from [place], per [facility] handoff can return if [facility] is approved, pt will need PT consult, PA made aware.        Patient Goals and CMS Choice            Expected Discharge Plan and Services                                              Prior Living Arrangements/Services                       Activities of Daily Living      Permission Sought/Granted                  Emotional Assessment              Admission diagnosis:  Generalized Weakness Patient Active Problem List   Diagnosis Date Noted   Infection of bursa 01/25/2024   Prepatellar bursitis of right knee 01/25/2024   Abscess in bursa 01/25/2024   Hypotension 12/20/2023   Failed back syndrome of lumbar spine 08/04/2022   Arthropathy of both sacroiliac joints 04/06/2022   Colitis due to enteroinvasive Escherichia coli 11/28/2021   Hypophosphatemia 11/27/2021   BPH (benign prostatic hyperplasia) 11/26/2021   Pancolitis 11/26/2021   Pancreatic insufficiency 11/26/2021   Sepsis (HCC) 11/25/2021   Chronic sinusitis 11/23/2021   Hypertension associated with type 2 diabetes mellitus (HCC) 10/25/2021   Foot callus 10/06/2021   Folliculitis 10/06/2021   Central sleep apnea comorbid with prescribed opioid use 08/17/2021   Obstructive sleep apnea on CPAP 08/17/2021   Chronic intermittent hypoxia with obstructive sleep apnea 08/17/2021   Subjective memory complaints 08/17/2021   Word finding difficulty 08/17/2021   Eating disorder 06/28/2021   Abnormal laboratory test 06/21/2021   COVID-19 virus infection 06/03/2021   Arthritis  of left shoulder region    AV block, Mobitz 2 03/18/2021   S/P reverse total shoulder arthroplasty, left 03/17/2021   Iliotibial band syndrome of both sides 03/16/2021   Chronic respiratory failure with hypoxia (HCC) 02/26/2021   Dyspnea 01/14/2021   Nasal turbinate hypertrophy 11/16/2020   Sinusitis 11/02/2020   Memory loss or impairment 10/11/2020   Right arm pain 09/18/2020   Aortic atherosclerosis 09/16/2020   Spondylolisthesis, lumbar region    Status post lumbar spinal fusion 07/27/2020   Nasal sinus polyp 06/16/2020   Sacroiliac joint pain 04/02/2020   Vitamin D  deficiency 02/24/2020   Cough 07/29/2019   Wheezing 07/29/2019   Possible exposure to STD 07/24/2019   Unilateral primary osteoarthritis, left hip 06/06/2019   Status post total replacement of left hip 06/06/2019   S/P laparoscopic sleeve gastrectomy 03/03/2019   Rash 01/24/2019   Fever 08/15/2018   UTI (urinary tract infection) 08/15/2018   Fall at home, initial encounter 08/15/2018   Normocytic anemia 08/15/2018   Hypokalemia 08/15/2018   Bowel incontinence 07/25/2018   Other spondylosis with radiculopathy, lumbar region  07/16/2018    Class: Chronic   Status post lumbar laminectomy 07/16/2018   Status post THR (total hip replacement) 04/17/2018   Unilateral primary osteoarthritis, right hip    Myocardial infarction (HCC) 02/25/2018   Coronary artery disease 02/25/2018   PAD (peripheral artery disease) 02/25/2018   S/P BKA (below knee amputation) unilateral, right (HCC) 02/25/2018   Sacroiliitis 02/25/2018   SOB (shortness of breath) 01/03/2018   Acute on chronic heart failure (HCC) 01/03/2018   Severe right groin pain 12/20/2017   Morbid obesity (HCC) 10/09/2017   Low back pain 07/13/2017   Leukoplakia, tongue 01/26/2017   Skin lesion 10/20/2016   S/P unilateral BKA (below knee amputation), right (HCC) 06/14/2016   Charcot foot due to diabetes mellitus (HCC)    Charcot's arthropathy associated with  type 2 diabetes mellitus (HCC) 04/11/2016   Encounter for well adult exam with abnormal findings 11/05/2015   Major depression 09/13/2015   S/P TKR (total knee replacement) bilaterally 09/13/2015   GERD (gastroesophageal reflux disease) 09/08/2015   S/P laparoscopic hernia repair 05/11/2015   PPD positive 04/08/2015   Benign neoplasm of descending colon    Benign neoplasm of cecum    Acute blood loss as cause of postoperative anemia    Chronic anticoagulation    Occult blood positive stool 10/17/2014   General weakness 07/14/2014   Urinary incontinence 07/14/2014   Controlled diabetes mellitus with hyperglycemia (HCC) 05/06/2014   Hypotension during surgery 01/30/2014    Class: Acute   Headache 10/15/2013   Spinal stenosis in cervical region 09/26/2013    Class: Chronic   Congenital spinal stenosis of lumbar region 09/26/2013    Class: Chronic   Hand joint pain 06/10/2013   Rotator cuff tear arthropathy of both shoulders 06/10/2013   Skin lesion of cheek 05/01/2013   Pain of right thumb 04/03/2013   Balance disorder 03/12/2013   Gait disorder 03/12/2013   Tremor 03/12/2013   Left hip pain 03/12/2013   Pre-ulcerative corn or callous 02/06/2013   Anxiety 11/12/2011   OSA on CPAP 11/07/2011   Bradycardia 10/20/2011   Insomnia 10/04/2011   Obesity 01/12/2011   Nausea and vomiting 09/28/2010   Type 2 diabetes mellitus with obesity 09/27/2010   ERECTILE DYSFUNCTION, ORGANIC 05/30/2010   Degenerative disc disease, lumbar 04/19/2010   SCIATICA, LEFT 04/19/2010   Chronic pain syndrome 10/27/2009   Hyperlipidemia 07/15/2009   Essential hypertension 06/24/2009   Coronary artery disease involving native coronary artery of native heart without angina pectoris 06/24/2009   Allergic rhinitis 06/24/2009   Urethral stricture 06/24/2009   Osteoarthritis 06/24/2009   SHOULDER PAIN, BILATERAL 06/24/2009   Physical deconditioning 06/24/2009   NEPHROLITHIASIS, HX OF 06/24/2009   PCP:   Ileen [name], NP Pharmacy:   [facility] - [place], [place] - [address] [place] [address] Phone: [phone] Fax: [phone]  [facility] [address] [place], [place] - [address] [place] [address] Phone: [phone] Fax: [phone]  [facility] [address] [place] [address] Phone: [phone] Fax: [phone]     Social Drivers of Health (SDOH) Social History: SDOH Screenings   Food Insecurity: No Food Insecurity (01/25/2024)  Housing: Low Risk  (01/25/2024)  Transportation Needs: No Transportation Needs (01/25/2024)  Utilities: Not At Risk (01/25/2024)  Depression (PHQ2-9): Medium Risk (08/15/2023)  Financial Resource Strain: High Risk (10/10/2022)   [facility] from [facility]  Physical Activity: Unknown (  10/10/2022)   Received from [facility]  Social Connections: Socially Isolated (01/25/2024)  Stress: Stress Concern Present (10/10/2022)   Received from [facility]  Tobacco Use: Medium Risk (03/05/2024)   SDOH Interventions:     Readmission Risk Interventions     No data to display

## 2024-03-05 NOTE — ED Notes (Signed)
 ED phlebotomist attempted to attain blood samples. Attempt was unsuccessful. Primary RN aware.

## 2024-03-06 LAB — CBG MONITORING, ED
Glucose-Capillary: 106 mg/dL — ABNORMAL HIGH (ref 70–99)
Glucose-Capillary: 108 mg/dL — ABNORMAL HIGH (ref 70–99)
Glucose-Capillary: 119 mg/dL — ABNORMAL HIGH (ref 70–99)
Glucose-Capillary: 163 mg/dL — ABNORMAL HIGH (ref 70–99)
Glucose-Capillary: 82 mg/dL (ref 70–99)

## 2024-03-06 MED ORDER — FAMOTIDINE 20 MG PO TABS
40.0000 mg | ORAL_TABLET | Freq: Every day | ORAL | Status: DC
  Administered 2024-03-06: 40 mg via ORAL
  Filled 2024-03-06: qty 2

## 2024-03-06 MED ORDER — EMTRICITABINE-TENOFOVIR AF 200-25 MG PO TABS
1.0000 | ORAL_TABLET | Freq: Every day | ORAL | Status: DC
  Administered 2024-03-06 – 2024-03-07 (×2): 1 via ORAL
  Filled 2024-03-06 (×2): qty 1

## 2024-03-06 MED ORDER — DULOXETINE HCL 60 MG PO CPEP
60.0000 mg | ORAL_CAPSULE | Freq: Two times a day (BID) | ORAL | Status: DC
  Administered 2024-03-06 – 2024-03-07 (×3): 60 mg via ORAL
  Filled 2024-03-06 (×3): qty 1

## 2024-03-06 MED ORDER — DONEPEZIL HCL 5 MG PO TABS
10.0000 mg | ORAL_TABLET | Freq: Every morning | ORAL | Status: DC
  Administered 2024-03-06 – 2024-03-07 (×2): 10 mg via ORAL
  Filled 2024-03-06: qty 1
  Filled 2024-03-06: qty 2

## 2024-03-06 MED ORDER — ASPIRIN 81 MG PO TBEC
81.0000 mg | DELAYED_RELEASE_TABLET | Freq: Every day | ORAL | Status: DC
  Administered 2024-03-06: 81 mg via ORAL
  Filled 2024-03-06: qty 1

## 2024-03-06 MED ORDER — ACETAMINOPHEN 500 MG PO TABS
1000.0000 mg | ORAL_TABLET | Freq: Once | ORAL | Status: AC
  Administered 2024-03-06: 1000 mg via ORAL
  Filled 2024-03-06: qty 2

## 2024-03-06 MED ORDER — ARIPIPRAZOLE 5 MG PO TABS
15.0000 mg | ORAL_TABLET | Freq: Every day | ORAL | Status: DC
  Administered 2024-03-06: 15 mg via ORAL
  Filled 2024-03-06: qty 1

## 2024-03-06 MED ORDER — NYSTATIN 100000 UNIT/GM EX POWD
Freq: Once | CUTANEOUS | Status: AC
  Filled 2024-03-06: qty 15

## 2024-03-06 NOTE — ED Provider Notes (Signed)
  [place] EMERGENCY DEPARTMENT AT [facility] Emergency Medicine Observation Re-evaluation Note  Travis [name]. is a 73 y.o. male, seen on rounds today.  Pt initially presented on 03/05/24 at 1452 to the ED for complaints of  Chief Complaint  Patient presents with   Failure To Thrive  Patient presented for weakness, was recently in [place] rehab, and went home, however has been unable to perform his ADLs at home.  Reports numerous falls at home, and previous rehab was reportedly unwilling to accept him back from the outpatient setting.  Medicine was consulted for admission, and patient was found to not meet criteria for inpatient admission.  Patient continued on Keflex  for left lower extremity cellulitis, and TOC was consulted PMHx: CAD, hypertension, GERD, hyperlipidemia, MI s/p CABG, T2DM, right BKA  Currently, the patient is resting quietly.  Physical Exam  BP (!) 156/67   Pulse (!) 50   Temp 98.7 F (37.1 C)   Resp 19   Ht 6' (1.829 m)   Wt 90.7 kg   SpO2 99%   BMI 27.12 kg/m  Physical Exam General: NAD Lungs: Normal effort Psych: Currently calm  ED Course / MDM  EKG:EKG Interpretation Date/Time:  Wednesday March 05 2024 16:15:03 EDT Ventricular Rate:  61 PR Interval:  182 QRS Duration:  123 QT Interval:  470 QTC Calculation: 474 R Axis:   6  Text Interpretation: Sinus rhythm Nonspecific intraventricular conduction delay Confirmed by Neysa Clap [phone]) on 03/05/2024 7:31:40 PM  I have reviewed the labs performed to date as well as medications administered while in observation.  Recent changes in the last 24 hours include no acute events or new results since initial workup. Home medications: Ordered Diet: Ordered  Plan  Current plan is for [facility] consult.   Rogelia Jerilynn RAMAN, MD 03/06/24 [phone]

## 2024-03-06 NOTE — ED Notes (Signed)
 PT boosted up in bed at this time. Breathing is even and unlabored. PT eating breakfast

## 2024-03-06 NOTE — [place] FL2 (Signed)
 [place]  MEDICAID [id number] LEVEL OF CARE FORM     IDENTIFICATION  Patient Name: Travis ZARING Sr. Birthdate: 21-Dec-1950 Sex: male Admission Date (Current Location): 03/05/2024  [facility] and [place] Number:  Producer, television/film/video and Address:  [facility], [address] [place], [place] [address]      Provider Number: [id number]  Attending Physician Name and Address:  Rogelia Jerilynn RAMAN, MD  Relative Name and Phone Number:       Current Level of Care: Hospital Recommended Level of Care: Skilled Nursing Facility Prior Approval Number:    Date Approved/Denied:   PASRR Number: [id number]  Discharge Plan: SNF    Current Diagnoses: Patient Active Problem List   Diagnosis Date Noted   Infection of bursa 01/25/2024   Prepatellar bursitis of right knee 01/25/2024   Abscess in bursa 01/25/2024   Hypotension 12/20/2023   Failed back syndrome of lumbar spine 08/04/2022   Arthropathy of both sacroiliac joints 04/06/2022   Colitis due to enteroinvasive Escherichia coli 11/28/2021   Hypophosphatemia 11/27/2021   BPH (benign prostatic hyperplasia) 11/26/2021   Pancolitis 11/26/2021   Pancreatic insufficiency 11/26/2021   Sepsis (HCC) 11/25/2021   Chronic sinusitis 11/23/2021   Hypertension associated with type 2 diabetes mellitus (HCC) 10/25/2021   Foot callus 10/06/2021   Folliculitis 10/06/2021   Central sleep apnea comorbid with prescribed opioid use 08/17/2021   Obstructive sleep apnea on CPAP 08/17/2021   Chronic intermittent hypoxia with obstructive sleep apnea 08/17/2021   Subjective memory complaints 08/17/2021   Word finding difficulty 08/17/2021   Eating disorder 06/28/2021   Abnormal laboratory test 06/21/2021   COVID-19 virus infection 06/03/2021   Arthritis of left shoulder region    AV block, Mobitz 2 03/18/2021   S/P reverse total shoulder arthroplasty, left 03/17/2021   Iliotibial band syndrome of both sides 03/16/2021   Chronic  respiratory failure with hypoxia (HCC) 02/26/2021   Dyspnea 01/14/2021   Nasal turbinate hypertrophy 11/16/2020   Sinusitis 11/02/2020   Memory loss or impairment 10/11/2020   Right arm pain 09/18/2020   Aortic atherosclerosis 09/16/2020   Spondylolisthesis, lumbar region    Status post lumbar spinal fusion 07/27/2020   Nasal sinus polyp 06/16/2020   Sacroiliac joint pain 04/02/2020   Vitamin D  deficiency 02/24/2020   Cough 07/29/2019   Wheezing 07/29/2019   Possible exposure to STD 07/24/2019   Unilateral primary osteoarthritis, left hip 06/06/2019   Status post total replacement of left hip 06/06/2019   S/P laparoscopic sleeve gastrectomy 03/03/2019   Rash 01/24/2019   Fever 08/15/2018   UTI (urinary tract infection) 08/15/2018   Fall at home, initial encounter 08/15/2018   Normocytic anemia 08/15/2018   Hypokalemia 08/15/2018   Bowel incontinence 07/25/2018   Other spondylosis with radiculopathy, lumbar region 07/16/2018   Status post lumbar laminectomy 07/16/2018   Status post THR (total hip replacement) 04/17/2018   Unilateral primary osteoarthritis, right hip    Myocardial infarction (HCC) 02/25/2018   Coronary artery disease 02/25/2018   PAD (peripheral artery disease) 02/25/2018   S/P BKA (below knee amputation) unilateral, right (HCC) 02/25/2018   Sacroiliitis 02/25/2018   SOB (shortness of breath) 01/03/2018   Acute on chronic heart failure (HCC) 01/03/2018   Severe right groin pain 12/20/2017   Morbid obesity (HCC) 10/09/2017   Low back pain 07/13/2017   Leukoplakia, tongue 01/26/2017   Skin lesion 10/20/2016   S/P unilateral BKA (below knee amputation), right (HCC) 06/14/2016   Charcot foot due to  diabetes mellitus (HCC)    Charcot's arthropathy associated with type 2 diabetes mellitus (HCC) 04/11/2016   Encounter for well adult exam with abnormal findings 11/05/2015   Major depression 09/13/2015   S/P TKR (total knee replacement) bilaterally 09/13/2015    GERD (gastroesophageal reflux disease) 09/08/2015   S/P laparoscopic hernia repair 05/11/2015   PPD positive 04/08/2015   Benign neoplasm of descending colon    Benign neoplasm of cecum    Acute blood loss as cause of postoperative anemia    Chronic anticoagulation    Occult blood positive stool 10/17/2014   General weakness 07/14/2014   Urinary incontinence 07/14/2014   Controlled diabetes mellitus with hyperglycemia (HCC) 05/06/2014   Hypotension during surgery 01/30/2014   Headache 10/15/2013   Spinal stenosis in cervical region 09/26/2013   Congenital spinal stenosis of lumbar region 09/26/2013   Hand joint pain 06/10/2013   Rotator cuff tear arthropathy of both shoulders 06/10/2013   Skin lesion of cheek 05/01/2013   Pain of right thumb 04/03/2013   Balance disorder 03/12/2013   Gait disorder 03/12/2013   Tremor 03/12/2013   Left hip pain 03/12/2013   Pre-ulcerative corn or callous 02/06/2013   Anxiety 11/12/2011   OSA on CPAP 11/07/2011   Bradycardia 10/20/2011   Insomnia 10/04/2011   Obesity 01/12/2011   Nausea and vomiting 09/28/2010   Type 2 diabetes mellitus with obesity 09/27/2010   ERECTILE DYSFUNCTION, ORGANIC 05/30/2010   Degenerative disc disease, lumbar 04/19/2010   SCIATICA, LEFT 04/19/2010   Chronic pain syndrome 10/27/2009   Hyperlipidemia 07/15/2009   Essential hypertension 06/24/2009   Coronary artery disease involving native coronary artery of native heart without angina pectoris 06/24/2009   Allergic rhinitis 06/24/2009   Urethral stricture 06/24/2009   Osteoarthritis 06/24/2009   SHOULDER PAIN, BILATERAL 06/24/2009   Physical deconditioning 06/24/2009   NEPHROLITHIASIS, HX OF 06/24/2009    Orientation RESPIRATION BLADDER Height & Weight     Self, Time, Situation, Place  Normal Continent Weight: 200 lb (90.7 kg) Height:  6' (182.9 cm)  BEHAVIORAL SYMPTOMS/MOOD NEUROLOGICAL BOWEL NUTRITION STATUS      Continent    AMBULATORY STATUS  COMMUNICATION OF NEEDS Skin   Limited Assist Verbally Normal                       Personal Care Assistance Level of Assistance  Bathing, Feeding, Dressing Bathing Assistance: Limited assistance Feeding assistance: Limited assistance Dressing Assistance: Limited assistance     Functional Limitations Info  Sight, Hearing, Speech Sight Info: Impaired Hearing Info: Adequate Speech Info: Adequate    SPECIAL CARE FACTORS FREQUENCY  PT (By licensed PT), OT (By licensed OT)                    Contractures Contractures Info: Not present    Additional Factors Info  Code Status Code Status Info: FULL CODE             Current Medications (03/06/2024):  This is the current hospital active medication list Current Facility-Administered Medications  Medication Dose Route Frequency Provider Last Rate Last Admin   acetaminophen  (TYLENOL ) tablet 650 mg  650 mg Oral Q4H PRN Smoot, Sarah A, PA-C   650 mg at 03/06/24 1039   ARIPiprazole  (ABILIFY ) tablet 15 mg  15 mg Oral QHS Stanek, Lawrence S, MD       aspirin  EC tablet 81 mg  81 mg Oral QHS Stanek, Lawrence S, MD       atorvastatin  (  LIPITOR) tablet 20 mg  20 mg Oral QPM Smoot, Lauraine LABOR, PA-C   20 mg at 03/05/24 2142   cephALEXin  (KEFLEX ) capsule 500 mg  500 mg Oral QID Smoot, Sarah A, PA-C   500 mg at 03/06/24 0611   donepezil  (ARICEPT ) tablet 10 mg  10 mg Oral q AM Rogelia Jerilynn RAMAN, MD   10 mg at 03/06/24 1038   DULoxetine  (CYMBALTA ) DR capsule 60 mg  60 mg Oral BID Stanek, Lawrence S, MD   60 mg at 03/06/24 1038   emtricitabine -tenofovir  AF (DESCOVY ) 200-25 MG per tablet 1 tablet  1 tablet Oral Daily Stanek, Lawrence S, MD       famotidine  (PEPCID ) tablet 40 mg  40 mg Oral QHS Stanek, Lawrence S, MD       furosemide  (LASIX ) tablet 20 mg  20 mg Oral QODAY Smoot, Sarah A, PA-C   20 mg at 03/05/24 2141   lipase/protease/amylase (CREON ) capsule 108,000 Units  108,000 Units Oral TID with meals Smoot, Sarah A, PA-C   108,000 Units  at 03/06/24 1009   methocarbamol  (ROBAXIN ) tablet 500 mg  500 mg Oral Q8H PRN Smoot, Sarah A, PA-C   500 mg at 03/06/24 1038   methylPREDNISolone  acetate (DEPO-MEDROL ) injection 40 mg  40 mg Other Once        pantoprazole  (PROTONIX ) EC tablet 40 mg  40 mg Oral BID Smoot, Sarah A, PA-C   40 mg at 03/06/24 1009   prasugrel  (EFFIENT ) tablet 5 mg  5 mg Oral Daily Smoot, Lauraine LABOR, PA-C       Current Outpatient Medications  Medication Sig Dispense Refill   ARIPiprazole  (ABILIFY ) 15 MG tablet Take 15 mg by mouth at bedtime.     aspirin  EC 81 MG tablet Take 81 mg by mouth at bedtime. Swallow whole.     atorvastatin  (LIPITOR) 20 MG tablet TAKE ONE TABLET BY MOUTH EVERY DAY (Patient taking differently: Take 20 mg by mouth at bedtime.) 90 tablet 2   celecoxib  (CELEBREX ) 200 MG capsule Take 1 capsule (200 mg total) by mouth 2 (two) times daily. 180 capsule 3   clonazePAM  (KLONOPIN ) 0.5 MG tablet Take 0.25 mg by mouth 2 (two) times daily as needed for anxiety.     donepezil  (ARICEPT ) 10 MG tablet Take 1 tablet (10 mg total) by mouth at bedtime. (Patient taking differently: Take 10 mg by mouth in the morning.) 90 tablet 0   DULoxetine  (CYMBALTA ) 60 MG capsule Take 60 mg by mouth 2 (two) times daily.     emtricitabine -tenofovir  (TRUVADA) 200-300 MG tablet Take 1 tablet by mouth at bedtime.     famotidine  (PEPCID ) 40 MG tablet Take 40 mg by mouth at bedtime.     furosemide  (LASIX ) 20 MG tablet Take 20 mg by mouth every other day.     gabapentin  (NEURONTIN ) 600 MG tablet Take 1,200 mg by mouth 3 (three) times daily.     MYRBETRIQ  50 MG TB24 tablet Take 50 mg by mouth at bedtime.     naloxone (NARCAN) nasal spray 4 mg/0.1 mL SMARTSIG:Both Nares     Omega-3 Fatty Acids (FISH OIL) 1000 MG CAPS Take 1,000 mg by mouth daily.     oxyCODONE -acetaminophen  (PERCOCET) 10-325 MG tablet Take 1 tablet by mouth every 6 (six) hours as needed. 30 tablet 0   Pancrelipase , Lip-Prot-Amyl, (ZENPEP ) 40000-126000 units CPEP Take 3  capsules with meals and 2 capsule with each snack (2-3 snacks/day). Please keep your November appointment for further refills 450  capsule 1   pantoprazole  (PROTONIX ) 40 MG tablet Take 40 mg by mouth in the morning, at noon, and at bedtime.     prasugrel  (EFFIENT ) 5 MG TABS tablet Take 1 tablet (5 mg total) by mouth daily. (Patient taking differently: Take 5 mg by mouth at bedtime.) 90 tablet 0   sildenafil  (VIAGRA ) 50 MG tablet Take 1 tablet (50 mg total) by mouth daily as needed for erectile dysfunction. 10 tablet 0   silver  sulfADIAZINE  (SILVADENE ) 1 % cream Apply 1 Application topically daily. (Patient taking differently: Apply 1 Application topically daily as needed (for burn,scars).) 50 g 0   tamsulosin  (FLOMAX ) 0.4 MG CAPS capsule Take 0.4 mg by mouth 2 (two) times daily.     Testosterone  1.62 % GEL place two pumps onto THE SKIN EVERY DAY (Patient taking differently: Apply 2 Pump topically daily. Place two pumps on each shoulder daily) 75 g 1   traZODone  (DESYREL ) 150 MG tablet Take 1 tablet (150 mg total) by mouth at bedtime. 90 tablet 0   zolpidem  (AMBIEN ) 10 MG tablet Take 10 mg by mouth at bedtime as needed.       Discharge Medications: Please see discharge summary for a list of discharge medications.  Relevant Imaging Results:  Relevant Lab Results:   Additional Information ssn: [id number]  Gwenn [name] Norris, [place]

## 2024-03-06 NOTE — Telephone Encounter (Signed)
 I called and sw pt and he was admitted yesterday as failure to thrive and looking for SNF placement.

## 2024-03-06 NOTE — ED Notes (Signed)
 Pt given graham crackers, reporting mild nausea dt being hungry. Pt denies needing a sandwich but will wait for breakfast to come around. Emesis bag and crackers at bedside.

## 2024-03-06 NOTE — Progress Notes (Addendum)
 Bed offer received from [facility], confirmed with Quandra.  Pt notified and accepted bed.  CMA started [place], [phone].  [name] Das, MSW, LCSW [phone] (coverage)

## 2024-03-06 NOTE — ED Notes (Signed)
 Pt repositioned in bed, denies any other needs. Call light within reach, pt watching tv

## 2024-03-06 NOTE — Evaluation (Signed)
 Physical Therapy Evaluation Patient Details Name: CLEOTHA TSANG Sr. MRN: [id number] DOB: 10-12-1950 Today's Date: 03/06/2024  History of Present Illness  73 [facility], difficulty caring for self. PMH multiple recent admissions/ED visits including 7/31 for falls and 9/5-9/16 for RLE abscess, L reverse TSA on 03/07/21, R BKA, R ankle fusion, scolsis, S/P lubar fusion,  Bil TKA, Bil THA, DM, and HTN.  Clinical Impression   Pt presents with generalized weakness, impaired balance with x8 falls in the past 2 weeks, decreased activity tolerance, and difficulty caring for self. Pt to benefit from acute PT to address deficits. Pt ambulated short room distance with use of RW and RLE prosthetic, pt overall requiring light physical assist for mobility. At baseline pt lives home alone, and is struggling to care for self and is falling frequently. Patient will benefit from continued inpatient follow up therapy, <3 hours/day. PT to progress mobility as tolerated, and will continue to follow acutely.          If plan is discharge home, recommend the following: A little help with walking and/or transfers;A little help with bathing/dressing/bathroom   Can travel by private vehicle   Yes    Equipment Recommendations None recommended by PT  Recommendations for Other Services       Functional Status Assessment Patient has had a recent decline in their functional status and demonstrates the ability to make significant improvements in function in a reasonable and predictable amount of time.     Precautions / Restrictions Precautions Precautions: Fall Precaution/Restrictions Comments: history of multiple falls over the past 2 weeks; R BKA with chronic wound over R patella Restrictions Weight Bearing Restrictions Per Provider Order: No      Mobility  Bed Mobility Overal bed mobility: Needs Assistance Bed Mobility: Supine to Sit, Sit to Supine     Supine to sit:  Min assist Sit to supine: Min assist   General bed mobility comments: assist for trunk elevation, LE lift into bed upon return to supine. increased time with sequencing cues    Transfers Overall transfer level: Needs assistance Equipment used: Rolling walker (2 wheels) Transfers: Sit to/from Stand Sit to Stand: Min assist           General transfer comment: light rise and steady assist    Ambulation/Gait Ambulation/Gait assistance: Contact guard assist Gait Distance (Feet): 15 Feet Assistive device: Rolling walker (2 wheels) Gait Pattern/deviations: Step-through pattern, Decreased stride length, Trunk flexed Gait velocity: decr     General Gait Details: close guard for safety, cues for upright posture and sequencing  Stairs            Wheelchair Mobility     Tilt Bed    Modified Rankin (Stroke Patients Only)       Balance Overall balance assessment: Needs assistance, History of Falls Sitting-balance support: No upper extremity supported, Feet supported Sitting balance-Leahy Scale: Fair Sitting balance - Comments: requires PT assist to gain sitting balance, once balanced supervision Postural control: Posterior lean, Right lateral lean Standing balance support: Bilateral upper extremity supported, During functional activity Standing balance-Leahy Scale: Poor                               Pertinent Vitals/Pain Pain Assessment Pain Assessment: 0-10 Pain Score: 8  Pain Location: R shoulder, back Pain Descriptors / Indicators: Sore, Discomfort Pain Intervention(s): Limited activity within patient's tolerance, Monitored during session,  Repositioned    Home Living Family/patient expects to be discharged to:: Private residence Living Arrangements: Alone Available Help at Discharge: Available PRN/intermittently;Friend(s);Family (friend helps once in a while) Type of Home: House Home Access: Ramped entrance       Home Layout: One  level Home Equipment: Agricultural consultant (2 wheels);Rollator (4 wheels);Cane - single point;Shower seat;Grab bars - toilet;Grab bars - tub/shower      Prior Function Prior Level of Function : Needs assist;History of Falls (last six months)             Mobility Comments: exclusively using rollator, 8 falls in the past 2 weeks ADLs Comments: difficult to perform showering, toileting, caring for self at home. [employer] grocery delivery, uses public transportation to get to appointments     Extremity/Trunk Assessment   Upper Extremity Assessment Upper Extremity Assessment: Defer to OT evaluation    Lower Extremity Assessment Lower Extremity Assessment: Generalized weakness;RLE deficits/detail RLE Deficits / Details: remote history of R BKA - full knee extension and flexion, at least 3/5 hip abd/add    Cervical / Trunk Assessment Cervical / Trunk Assessment: Normal  Communication   Communication Communication: No apparent difficulties    Cognition Arousal: Alert Behavior During Therapy: WFL for tasks assessed/performed   PT - Cognitive impairments: No apparent impairments                                 Cueing Cueing Techniques: Gestural cues, Verbal cues     General Comments General comments (skin integrity, edema, etc.): R patellar wound, no dressing donned. multiple wounds and blistering distal>proximal L lower leg. BP 140s/70s throughout    Exercises     Assessment/Plan    PT Assessment Patient needs continued PT services  PT Problem List Decreased strength;Decreased mobility;Decreased activity tolerance;Decreased balance;Decreased knowledge of use of DME;Pain;Decreased knowledge of precautions;Decreased safety awareness       PT Treatment Interventions DME instruction;Therapeutic activities;Gait training;Therapeutic exercise;Patient/family education;Balance training;Functional mobility training;Neuromuscular re-education    PT Goals (Current goals can  be found in the Care Plan section)  Acute Rehab PT Goals Patient Stated Goal: go to rehab to get stronger PT Goal Formulation: With patient Time For Goal Achievement: 03/20/24 Potential to Achieve Goals: Good    Frequency Min 1X/week     Co-evaluation               AM-PAC PT 6 Clicks Mobility  Outcome Measure Help needed turning from your back to your side while in a flat bed without using bedrails?: A Little Help needed moving from lying on your back to sitting on the side of a flat bed without using bedrails?: A Little Help needed moving to and from a bed to a chair (including a wheelchair)?: A Little Help needed standing up from a chair using your arms (e.g., wheelchair or bedside chair)?: A Little Help needed to walk in hospital room?: A Little Help needed climbing 3-5 steps with a railing? : A Lot 6 Click Score: 17    End of Session   Activity Tolerance: Patient limited by fatigue Patient left: in bed;with call bell/phone within reach;with nursing/sitter in room (in ED on stretcher) Nurse Communication: Mobility status PT Visit Diagnosis: Other abnormalities of gait and mobility (R26.89);Muscle weakness (generalized) (M62.81)    Time: 9061-8994 PT Time Calculation (min) (ACUTE ONLY): 27 min   Charges:   PT Evaluation $PT Eval Low Complexity: 1 Low PT Treatments $  Therapeutic Activity: 8-22 mins PT General Charges $$ ACUTE PT VISIT: 1 Visit         Shantelle Alles S, PT DPT Acute Rehabilitation Services Secure Chat Preferred  Office [phone]   Buel Molder FORBES [place] 03/06/2024, 10:48 AM

## 2024-03-07 MED ORDER — GABAPENTIN 300 MG PO CAPS
600.0000 mg | ORAL_CAPSULE | Freq: Three times a day (TID) | ORAL | Status: DC
  Administered 2024-03-07 (×2): 600 mg via ORAL
  Filled 2024-03-07 (×2): qty 2

## 2024-03-07 MED ORDER — CEPHALEXIN 500 MG PO CAPS: 500.0000 mg | ORAL_CAPSULE | Freq: Four times a day (QID) | ORAL | 0 refills | Status: AC

## 2024-03-07 NOTE — Progress Notes (Addendum)
 Hulan barrows remains pending for [place] but a determination is expected today.  Glenys at [place] updated [phone].   1124: Hulan barrows received for [facility], valid 10/17- 89/76, Rzmu#[id number]. Spoke to Tanya in admissions who confirmed they are prepared to admit pt to room 121A after 2:30pm today and requested report be called to [phone].  MD, RN, and pt updated.   [name] Das, MSW, LCSW [phone] (coverage)

## 2024-03-07 NOTE — ED Notes (Signed)
 PT is in bed and watching tv

## 2024-03-07 NOTE — Telephone Encounter (Signed)
 Travis [name] pt yesterday and he was admitted to hospital.

## 2024-03-07 NOTE — ED Notes (Signed)
 [facility] transport set up for patient.

## 2024-03-07 NOTE — ED Provider Notes (Addendum)
  [place] EMERGENCY DEPARTMENT AT [facility] Emergency Medicine Observation Re-evaluation Note  Travis [name]. is a 73 y.o. male, seen on rounds today.  Pt initially presented on 03/05/24 at 1452 to the ED for complaints of  Chief Complaint  Patient presents with   Failure To Thrive  Patient presented for weakness, was recently in [place] rehab, and went home, however has been unable to perform his ADLs at home.  Reports numerous falls at home, and previous rehab was reportedly unwilling to accept him back from the outpatient setting.  Medicine was consulted for admission, and patient was found to not meet criteria for inpatient admission.  Patient continued on Keflex  for left lower extremity cellulitis, and TOC was consulted PMHx: CAD, hypertension, GERD, hyperlipidemia, MI s/p CABG, T2DM, right BKA   Currently, the patient is resting quietly, eating breakfast. He has no complaints this AM except that he is ready to be out of this room.  Physical Exam  BP (!) 176/67 (BP Location: Right Arm)   Pulse (!) 58   Temp 98.1 F (36.7 C)   Resp 17   Ht 6' (1.829 m)   Wt 90.7 kg   SpO2 99%   BMI 27.12 kg/m  Physical Exam General: NAD Cardiac: well-perfused Lungs: Normal effort Psych: Currently calm  ED Course / MDM  EKG:EKG Interpretation Date/Time:  Wednesday March 05 2024 16:15:03 EDT Ventricular Rate:  61 PR Interval:  182 QRS Duration:  123 QT Interval:  470 QTC Calculation: 474 R Axis:   6  Text Interpretation: Sinus rhythm Nonspecific intraventricular conduction delay Confirmed by Neysa Clap [id number]) on 03/05/2024 7:31:40 PM  I have reviewed the labs performed to date as well as medications administered while in observation.  Recent changes in the last 24 hours include no acute events or new results since initial workup.  -Patient requests his home gabapentin  be added to his regimen. Patient had presented with numerous falls and had been ordered for  1,200 mg of gabapentin  TID. Will decrease to 600 mg TID for now and reassess, as this could have been contributing to his falls.   Plan  Current plan is for [facility] consult.   Clinical Course as of 03/07/24 1221  Fri Mar 07, 2024  1220 [facility] admitting patient today.  Continued his Keflex  for an additional 4 days for a total of 7 days.  Advised to follow-up with PCP within 1 week.  Given specific discharge instructions and return precautions.  Patient will be discharged to [facility] rehab. [HN]    Clinical Course User Index [HN] Franklyn Sid SAILOR, MD       Franklyn Sid SAILOR, MD 03/07/24 [phone]

## 2024-03-07 NOTE — Discharge Instructions (Addendum)
 Please take Keflex  500 mg 4 times a day for additional 4 days for your lower extremity infection.  Please follow-up with your primary care physician within 1 to 2 weeks to reevaluate this wound infection.  If you experience worsening symptoms including nausea vomiting and inability to take your antibiotic, fevers chills, or worsening infection or pain, please return immediately to the emergency department.  You are being discharged to [facility] rehab.

## 2024-03-07 NOTE — ED Notes (Signed)
 PT is watching tv and requested water .

## 2024-03-14 ENCOUNTER — Ambulatory Visit (INDEPENDENT_AMBULATORY_CARE_PROVIDER_SITE_OTHER): Admitting: Orthopedic Surgery

## 2024-03-14 DIAGNOSIS — M19011 Primary osteoarthritis, right shoulder: Secondary | ICD-10-CM | POA: Diagnosis not present

## 2024-03-15 ENCOUNTER — Encounter: Payer: Self-pay | Admitting: Orthopedic Surgery
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
## 2024-03-17 ENCOUNTER — Ambulatory Visit: Admitting: Orthopedic Surgery

## 2024-03-24 ENCOUNTER — Telehealth: Payer: Self-pay | Admitting: Orthopedic Surgery

## 2024-03-24 ENCOUNTER — Encounter: Payer: Self-pay | Admitting: Radiology

## 2024-03-24 NOTE — Telephone Encounter (Signed)
 Patient called and said he needs the referral for the CT scan on his R shoulder. CB#[phone]

## 2024-03-27 ENCOUNTER — Other Ambulatory Visit: Payer: Self-pay

## 2024-03-27 DIAGNOSIS — M25511 Pain in right shoulder: Secondary | ICD-10-CM
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
## 2024-04-04 ENCOUNTER — Ambulatory Visit: Admitting: Physician Assistant

## 2024-04-08 ENCOUNTER — Ambulatory Visit
Admission: RE | Admit: 2024-04-08 | Discharge: 2024-04-08 | Disposition: A | Discharge status: Home / Self Care | Source: Ambulatory Visit | Attending: Orthopedic Surgery | Admitting: Orthopedic Surgery

## 2024-04-08 DIAGNOSIS — M25511 Pain in right shoulder: Secondary | ICD-10-CM

## 2024-04-11 ENCOUNTER — Telehealth: Payer: Self-pay | Admitting: Cardiovascular Disease

## 2024-04-11 NOTE — Telephone Encounter (Signed)
 Pt c/o swelling/edema: STAT if pt has developed SOB within 24 hours  If swelling, where is the swelling located? Legs   How much weight have you gained and in what time span? No, pt has lost weight   Have you gained 2 pounds in a day or 5 pounds in a week? No   Do you have a log of your daily weights (if so, list)? No   Are you currently taking a fluid pill? No   Are you currently SOB? No   Have you traveled recently in a car or plane for an extended period of time? No   Pt just came home from nursing facility for rehab on Monday. Pt had swelling that started worsening once pt was home. Please advise.

## 2024-04-11 NOTE — Telephone Encounter (Signed)
 Returned patient's call, 2 identifiers used. Patient reports that he left nursing facility for spine issues and leg weakness on Monday. He states he has been noticing that his left leg is swollen (he has right BKA). He is not SOB, has not noticed weight gain. Current weight of 200 lb which is same as most recent weight in chart. He can still get his shoes and socks on. He states normally he takes one or 2 doses of lasix  when he notices leg swelling, which resolves the leg swelling for him. He states he did not receive any lasix  while he was at the nursing facility and he has not tried any lasix  since returning home. He states his BP is 138/60, HR 62 today.   Reviewed lasix  instructions with patient. Lasix  appears to have been ordered by PCP or another MD on 01/23/24. Patient confirms he thinks his PCP fills this for him. Instructions state Take 20 mg by mouth every other day. Cr from 03/05/24 lab work normal. Advised patient to go ahead and take lasix  today. Advised he can take it again on Sunday and then call us  Monday to let us  know if leg swelling has improved. Asked patient to track blood pressures as well.   Patient overdue for follow up from earlier this year, made appt with [name] whom he has seen before for early December.
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
## 2024-04-29 ENCOUNTER — Ambulatory Visit: Admitting: Physician Assistant

## 2024-04-29 DIAGNOSIS — Z01810 Encounter for preprocedural cardiovascular examination: Secondary | ICD-10-CM

## 2024-04-29 DIAGNOSIS — I5032 Chronic diastolic (congestive) heart failure: Secondary | ICD-10-CM

## 2024-04-29 DIAGNOSIS — E785 Hyperlipidemia, unspecified: Secondary | ICD-10-CM

## 2024-04-29 DIAGNOSIS — I10 Essential (primary) hypertension: Secondary | ICD-10-CM

## 2024-04-29 DIAGNOSIS — I2510 Atherosclerotic heart disease of native coronary artery without angina pectoris: Secondary | ICD-10-CM

## 2024-05-09 ENCOUNTER — Other Ambulatory Visit: Payer: Self-pay

## 2024-05-09 ENCOUNTER — Ambulatory Visit: Admitting: Orthopedic Surgery

## 2024-05-09 DIAGNOSIS — M25552 Pain in left hip: Secondary | ICD-10-CM

## 2024-05-10 ENCOUNTER — Encounter: Payer: Self-pay | Admitting: Orthopedic Surgery
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
## 2024-05-15 ENCOUNTER — Inpatient Hospital Stay (HOSPITAL_COMMUNITY)
Admission: EM | Admit: 2024-05-15 | Discharge: 2024-05-17 | Disposition: A | Discharge status: Home / Self Care | Attending: Internal Medicine | Admitting: Internal Medicine

## 2024-05-15 ENCOUNTER — Emergency Department (HOSPITAL_COMMUNITY)

## 2024-05-15 ENCOUNTER — Encounter (HOSPITAL_COMMUNITY): Payer: Self-pay

## 2024-05-15 ENCOUNTER — Inpatient Hospital Stay (HOSPITAL_COMMUNITY)

## 2024-05-15 ENCOUNTER — Other Ambulatory Visit: Payer: Self-pay

## 2024-05-15 DIAGNOSIS — Z7982 Long term (current) use of aspirin: Secondary | ICD-10-CM

## 2024-05-15 DIAGNOSIS — Z87891 Personal history of nicotine dependence: Secondary | ICD-10-CM | POA: Diagnosis not present

## 2024-05-15 DIAGNOSIS — F32A Depression, unspecified: Secondary | ICD-10-CM | POA: Diagnosis present

## 2024-05-15 DIAGNOSIS — Z800 Family history of malignant neoplasm of digestive organs: Secondary | ICD-10-CM

## 2024-05-15 DIAGNOSIS — E1140 Type 2 diabetes mellitus with diabetic neuropathy, unspecified: Secondary | ICD-10-CM | POA: Diagnosis present

## 2024-05-15 DIAGNOSIS — M797 Fibromyalgia: Secondary | ICD-10-CM | POA: Diagnosis present

## 2024-05-15 DIAGNOSIS — G4733 Obstructive sleep apnea (adult) (pediatric): Secondary | ICD-10-CM | POA: Diagnosis present

## 2024-05-15 DIAGNOSIS — N179 Acute kidney failure, unspecified: Secondary | ICD-10-CM | POA: Diagnosis present

## 2024-05-15 DIAGNOSIS — Z809 Family history of malignant neoplasm, unspecified: Secondary | ICD-10-CM

## 2024-05-15 DIAGNOSIS — Z1152 Encounter for screening for COVID-19: Secondary | ICD-10-CM | POA: Diagnosis not present

## 2024-05-15 DIAGNOSIS — Z860100 Personal history of colon polyps, unspecified: Secondary | ICD-10-CM

## 2024-05-15 DIAGNOSIS — A419 Sepsis, unspecified organism: Secondary | ICD-10-CM | POA: Diagnosis present

## 2024-05-15 DIAGNOSIS — I10 Essential (primary) hypertension: Secondary | ICD-10-CM | POA: Diagnosis present

## 2024-05-15 DIAGNOSIS — E1151 Type 2 diabetes mellitus with diabetic peripheral angiopathy without gangrene: Secondary | ICD-10-CM | POA: Diagnosis present

## 2024-05-15 DIAGNOSIS — Z96643 Presence of artificial hip joint, bilateral: Secondary | ICD-10-CM | POA: Diagnosis present

## 2024-05-15 DIAGNOSIS — R911 Solitary pulmonary nodule: Secondary | ICD-10-CM

## 2024-05-15 DIAGNOSIS — Z803 Family history of malignant neoplasm of breast: Secondary | ICD-10-CM

## 2024-05-15 DIAGNOSIS — Z79899 Other long term (current) drug therapy: Secondary | ICD-10-CM

## 2024-05-15 DIAGNOSIS — Z91048 Other nonmedicinal substance allergy status: Secondary | ICD-10-CM

## 2024-05-15 DIAGNOSIS — Z96653 Presence of artificial knee joint, bilateral: Secondary | ICD-10-CM | POA: Diagnosis present

## 2024-05-15 DIAGNOSIS — Z83438 Family history of other disorder of lipoprotein metabolism and other lipidemia: Secondary | ICD-10-CM

## 2024-05-15 DIAGNOSIS — Z791 Long term (current) use of non-steroidal anti-inflammatories (NSAID): Secondary | ICD-10-CM

## 2024-05-15 DIAGNOSIS — R6521 Severe sepsis with septic shock: Secondary | ICD-10-CM | POA: Diagnosis present

## 2024-05-15 DIAGNOSIS — N390 Urinary tract infection, site not specified: Secondary | ICD-10-CM | POA: Diagnosis present

## 2024-05-15 DIAGNOSIS — Z8249 Family history of ischemic heart disease and other diseases of the circulatory system: Secondary | ICD-10-CM

## 2024-05-15 DIAGNOSIS — G9341 Metabolic encephalopathy: Secondary | ICD-10-CM | POA: Diagnosis present

## 2024-05-15 DIAGNOSIS — Z833 Family history of diabetes mellitus: Secondary | ICD-10-CM

## 2024-05-15 DIAGNOSIS — Z955 Presence of coronary angioplasty implant and graft: Secondary | ICD-10-CM | POA: Diagnosis not present

## 2024-05-15 DIAGNOSIS — Z888 Allergy status to other drugs, medicaments and biological substances status: Secondary | ICD-10-CM

## 2024-05-15 DIAGNOSIS — Z818 Family history of other mental and behavioral disorders: Secondary | ICD-10-CM

## 2024-05-15 DIAGNOSIS — G894 Chronic pain syndrome: Secondary | ICD-10-CM | POA: Diagnosis present

## 2024-05-15 DIAGNOSIS — R578 Other shock: Secondary | ICD-10-CM

## 2024-05-15 DIAGNOSIS — D649 Anemia, unspecified: Secondary | ICD-10-CM | POA: Diagnosis present

## 2024-05-15 DIAGNOSIS — K219 Gastro-esophageal reflux disease without esophagitis: Secondary | ICD-10-CM | POA: Diagnosis present

## 2024-05-15 DIAGNOSIS — I252 Old myocardial infarction: Secondary | ICD-10-CM

## 2024-05-15 DIAGNOSIS — E785 Hyperlipidemia, unspecified: Secondary | ICD-10-CM | POA: Diagnosis present

## 2024-05-15 DIAGNOSIS — R4182 Altered mental status, unspecified: Secondary | ICD-10-CM | POA: Diagnosis not present

## 2024-05-15 DIAGNOSIS — I2510 Atherosclerotic heart disease of native coronary artery without angina pectoris: Secondary | ICD-10-CM | POA: Diagnosis present

## 2024-05-15 DIAGNOSIS — I2489 Other forms of acute ischemic heart disease: Secondary | ICD-10-CM | POA: Diagnosis present

## 2024-05-15 DIAGNOSIS — F419 Anxiety disorder, unspecified: Secondary | ICD-10-CM | POA: Diagnosis present

## 2024-05-15 DIAGNOSIS — Z8042 Family history of malignant neoplasm of prostate: Secondary | ICD-10-CM

## 2024-05-15 DIAGNOSIS — Z96612 Presence of left artificial shoulder joint: Secondary | ICD-10-CM | POA: Diagnosis present

## 2024-05-15 DIAGNOSIS — Z89511 Acquired absence of right leg below knee: Secondary | ICD-10-CM

## 2024-05-15 DIAGNOSIS — Z83719 Family history of colon polyps, unspecified: Secondary | ICD-10-CM

## 2024-05-15 DIAGNOSIS — R569 Unspecified convulsions: Secondary | ICD-10-CM | POA: Diagnosis not present

## 2024-05-15 DIAGNOSIS — Z9884 Bariatric surgery status: Secondary | ICD-10-CM

## 2024-05-15 DIAGNOSIS — Z808 Family history of malignant neoplasm of other organs or systems: Secondary | ICD-10-CM

## 2024-05-15 LAB — COMPREHENSIVE METABOLIC PANEL WITH GFR
ALT: 7 U/L (ref 0–44)
AST: 24 U/L (ref 15–41)
Albumin: 3.3 g/dL — ABNORMAL LOW (ref 3.5–5.0)
Alkaline Phosphatase: 86 U/L (ref 38–126)
Anion gap: 10 (ref 5–15)
BUN: 25 mg/dL — ABNORMAL HIGH (ref 8–23)
CO2: 23 mmol/L (ref 22–32)
Calcium: 7.7 mg/dL — ABNORMAL LOW (ref 8.9–10.3)
Chloride: 103 mmol/L (ref 98–111)
Creatinine, Ser: 1.44 mg/dL — ABNORMAL HIGH (ref 0.61–1.24)
GFR, Estimated: 52 mL/min — ABNORMAL LOW
Glucose, Bld: 134 mg/dL — ABNORMAL HIGH (ref 70–99)
Potassium: 4.2 mmol/L (ref 3.5–5.1)
Sodium: 137 mmol/L (ref 135–145)
Total Bilirubin: 0.5 mg/dL (ref 0.0–1.2)
Total Protein: 5.5 g/dL — ABNORMAL LOW (ref 6.5–8.1)

## 2024-05-15 LAB — URINALYSIS, ROUTINE W REFLEX MICROSCOPIC
Bilirubin Urine: NEGATIVE
Glucose, UA: NEGATIVE mg/dL
Ketones, ur: NEGATIVE mg/dL
Nitrite: POSITIVE — AB
Protein, ur: 30 mg/dL — AB
RBC / HPF: >50 RBC/hpf (ref 0–5)
Specific Gravity, Urine: 1.019 (ref 1.005–1.030)
WBC, UA: >50 WBC/hpf (ref 0–5)
pH: 5 (ref 5.0–8.0)

## 2024-05-15 LAB — RESP PANEL BY RT-PCR (RSV, FLU A&B, COVID)  RVPGX2
Influenza A by PCR: NEGATIVE
Influenza B by PCR: NEGATIVE
Resp Syncytial Virus by PCR: NEGATIVE
SARS Coronavirus 2 by RT PCR: NEGATIVE

## 2024-05-15 LAB — GLUCOSE, CAPILLARY
Glucose-Capillary: 102 mg/dL — ABNORMAL HIGH (ref 70–99)
Glucose-Capillary: 99 mg/dL (ref 70–99)
Glucose-Capillary: 128 mg/dL — ABNORMAL HIGH (ref 70–99)
Glucose-Capillary: 92 mg/dL (ref 70–99)

## 2024-05-15 LAB — CBC WITH DIFFERENTIAL/PLATELET
Abs Immature Granulocytes: 0.04 K/uL (ref 0.00–0.07)
Basophils Absolute: 0 K/uL (ref 0.0–0.1)
Basophils Relative: 0 %
Eosinophils Absolute: 0 K/uL (ref 0.0–0.5)
Eosinophils Relative: 1 %
HCT: 31.4 % — ABNORMAL LOW (ref 39.0–52.0)
Hemoglobin: 10.3 g/dL — ABNORMAL LOW (ref 13.0–17.0)
Immature Granulocytes: 1 %
Lymphocytes Relative: 16 %
Lymphs Abs: 1.2 K/uL (ref 0.7–4.0)
MCH: 29.5 pg (ref 26.0–34.0)
MCHC: 32.8 g/dL (ref 30.0–36.0)
MCV: 90 fL (ref 80.0–100.0)
Monocytes Absolute: 0.8 K/uL (ref 0.1–1.0)
Monocytes Relative: 11 %
Neutro Abs: 5.3 K/uL (ref 1.7–7.7)
Neutrophils Relative %: 71 %
Platelets: 186 K/uL (ref 150–400)
RBC: 3.49 MIL/uL — ABNORMAL LOW (ref 4.22–5.81)
RDW: 14.4 % (ref 11.5–15.5)
WBC: 7.4 K/uL (ref 4.0–10.5)
nRBC: 0 % (ref 0.0–0.2)

## 2024-05-15 LAB — ECHOCARDIOGRAM COMPLETE
Area-P 1/2: 2.66 cm2
Calc EF: 59.3 %
Height: 72 in
P 1/2 time: 710 ms
S' Lateral: 3.8 cm
Single Plane A2C EF: 61.6 %
Single Plane A4C EF: 56.7 %
Weight: 3360 [oz_av]

## 2024-05-15 LAB — TROPONIN T, HIGH SENSITIVITY: Troponin T High Sensitivity: 36 ng/L — ABNORMAL HIGH (ref 0–19)

## 2024-05-15 LAB — LIPASE, BLOOD: Lipase: <10 U/L — ABNORMAL LOW (ref 11–51)

## 2024-05-15 LAB — I-STAT CG4 LACTIC ACID, ED: Lactic Acid, Venous: 1.7 mmol/L (ref 0.5–1.9)

## 2024-05-15 LAB — MRSA NEXT GEN BY PCR, NASAL: MRSA by PCR Next Gen: DETECTED — AB

## 2024-05-15 MED ORDER — SODIUM CHLORIDE 0.9 % IV SOLN
250.0000 mL | INTRAVENOUS | Status: DC
  Administered 2024-05-16: 250 mL via INTRAVENOUS

## 2024-05-15 MED ORDER — VANCOMYCIN HCL 1750 MG/350ML IV SOLN
1750.0000 mg | INTRAVENOUS | Status: DC
  Filled 2024-05-15: qty 350

## 2024-05-15 MED ORDER — VANCOMYCIN HCL 2000 MG/400ML IV SOLN
2000.0000 mg | Freq: Once | INTRAVENOUS | Status: AC
  Administered 2024-05-15: 2000 mg via INTRAVENOUS
  Filled 2024-05-15: qty 400

## 2024-05-15 MED ORDER — DOCUSATE SODIUM 100 MG PO CAPS: 100.0000 mg | ORAL_CAPSULE | Freq: Two times a day (BID) | ORAL | Status: DC | PRN

## 2024-05-15 MED ORDER — PANTOPRAZOLE SODIUM 40 MG PO TBEC
40.0000 mg | DELAYED_RELEASE_TABLET | Freq: Every day | ORAL | Status: DC
  Administered 2024-05-16 – 2024-05-17 (×2): 40 mg via ORAL
  Filled 2024-05-15 (×2): qty 1

## 2024-05-15 MED ORDER — LACTATED RINGERS IV SOLN: INTRAVENOUS | Status: DC

## 2024-05-15 MED ORDER — LACTATED RINGERS IV BOLUS
1500.0000 mL | Freq: Once | INTRAVENOUS | Status: AC
  Administered 2024-05-15: 1500 mL via INTRAVENOUS

## 2024-05-15 MED ORDER — ZOLPIDEM TARTRATE 5 MG PO TABS
5.0000 mg | ORAL_TABLET | Freq: Once | ORAL | Status: AC
  Administered 2024-05-15: 5 mg via ORAL
  Filled 2024-05-15: qty 1

## 2024-05-15 MED ORDER — HEPARIN SODIUM (PORCINE) 5000 UNIT/ML IJ SOLN
5000.0000 [IU] | Freq: Three times a day (TID) | INTRAMUSCULAR | Status: DC
  Administered 2024-05-15 – 2024-05-16 (×3): 5000 [IU] via SUBCUTANEOUS
  Filled 2024-05-15 (×3): qty 1

## 2024-05-15 MED ORDER — NOREPINEPHRINE 4 MG/250ML-% IV SOLN
0.0000 ug/min | INTRAVENOUS | Status: DC
  Administered 2024-05-15: 2 ug/min via INTRAVENOUS
  Filled 2024-05-15: qty 250

## 2024-05-15 MED ORDER — PIPERACILLIN-TAZOBACTAM 3.375 G IVPB
3.3750 g | Freq: Three times a day (TID) | INTRAVENOUS | Status: DC
  Administered 2024-05-15 – 2024-05-16 (×3): 3.375 g via INTRAVENOUS
  Filled 2024-05-15 (×3): qty 50

## 2024-05-15 MED ORDER — SODIUM CHLORIDE 0.9 % IV SOLN
2.0000 g | Freq: Once | INTRAVENOUS | Status: AC
  Administered 2024-05-15: 2 g via INTRAVENOUS
  Filled 2024-05-15: qty 12.5

## 2024-05-15 MED ORDER — INSULIN ASPART 100 UNIT/ML IJ SOLN
0.0000 [IU] | INTRAMUSCULAR | Status: DC
  Administered 2024-05-15: 2 [IU] via SUBCUTANEOUS
  Filled 2024-05-15: qty 2

## 2024-05-15 MED ORDER — NOREPINEPHRINE 4 MG/250ML-% IV SOLN
0.0000 ug/min | INTRAVENOUS | Status: DC
  Administered 2024-05-15: 3 ug/min via INTRAVENOUS

## 2024-05-15 MED ORDER — CHLORHEXIDINE GLUCONATE CLOTH 2 % EX PADS
6.0000 | MEDICATED_PAD | Freq: Every day | CUTANEOUS | Status: DC
  Administered 2024-05-15 – 2024-05-17 (×3): 6 via TOPICAL

## 2024-05-15 MED ORDER — POLYETHYLENE GLYCOL 3350 17 G PO PACK: 17.0000 g | PACK | Freq: Every day | ORAL | Status: DC | PRN

## 2024-05-15 MED ORDER — LACTATED RINGERS IV BOLUS
1000.0000 mL | Freq: Once | INTRAVENOUS | Status: AC
  Administered 2024-05-15: 1000 mL via INTRAVENOUS

## 2024-05-15 MED ORDER — ORAL CARE MOUTH RINSE: 15.0000 mL | OROMUCOSAL | Status: DC | PRN

## 2024-05-15 NOTE — Progress Notes (Addendum)
 eLink Physician-Brief Progress Note Patient Name: [name]. DOB: Oct 01, 1950 MRN: [id number]   Date of Service  05/15/2024  HPI/Events of Note  pt asking for his home meds, especially his ambien . Pt takes abilify , celebrex , klonopin , aricept , cymbalta , truvada, neurontin , myrbetriq , effient , flomax , trazadone, and ambien  per home med list.  Sepsis, UTI, AKI,   Discussed with RN. Qtc 440, wake, here in ICU just for monitoring bradycardia.    eICU Interventions  Ambien  ordered, avoiding other qtc proloning meds for tonight     Intervention Category Intermediate Interventions: Other:  [name] 05/15/2024, 8:18 PM  0002 pt had foley inserted in ED, but there are no orders.  - insert foley ordered

## 2024-05-15 NOTE — Progress Notes (Signed)
" °  Echocardiogram 2D Echocardiogram has been performed.  Travis [name] 05/15/2024, 11:11 AM "

## 2024-05-15 NOTE — Progress Notes (Signed)
 ED Pharmacy Antibiotic Sign Off An antibiotic consult was received from an ED provider for Vancomycin   per pharmacy dosing for sepsis. A chart review was completed to assess appropriateness.   The following one time order(s) were placed:  Vancomycin  2000 mg IV  Further antibiotic and/or antibiotic pharmacy consults should be ordered by the admitting provider if indicated.   Dail Cordella Misty, [facility]  Clinical Pharmacist 05/15/2024 3:01 AM

## 2024-05-15 NOTE — ED Notes (Signed)
EDP made aware of pt's BP 

## 2024-05-15 NOTE — Plan of Care (Signed)

## 2024-05-15 NOTE — Progress Notes (Signed)
 Pharmacy Antibiotic Note  Travis [name]. is a 73 y.o. male admitted on 05/15/2024 with AMS/fevers/sepsis.  Pharmacy has been consulted for Vancomycin  and Zosyn   dosing.  Plan: Vancomycin  1750 mg IV q24h Zosyn  3.375 g IV q8h   Height: 6' (182.9 cm) Weight: 95.3 kg (210 lb) IBW/kg (Calculated) : 77.6  Temp (24hrs), Avg:98 F (36.7 C), Min:98 F (36.7 C), Max:98 F (36.7 C)  Recent Labs  Lab 05/15/24 0245 05/15/24 0300  WBC 7.4  --   CREATININE 1.44*  --   LATICACIDVEN  --  1.7    Estimated Creatinine Clearance: 56.4 mL/min (A) (by C-G formula based on SCr of 1.44 mg/dL (H)).    Allergies[1]  Travis [name] 05/15/2024 7:01 AM     [1]  Allergies Allergen Reactions   Nicotine Shortness Of Breath   Buprenorphine  Rash and Other (See Comments)    Only the patch causes a reaction,   Neosporin [Bacitracin -Polymyxin B] Other (See Comments)   Wound Dressing Adhesive Other (See Comments)    Rash, hives

## 2024-05-15 NOTE — ED Notes (Signed)
 Pt transported to CT ?
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
## 2024-05-15 NOTE — ED Provider Notes (Signed)
 " [place] EMERGENCY DEPARTMENT AT [facility] Provider Note   CSN: [id number] Arrival date & time: 05/15/24  9760     History Chief Complaint  Patient presents with   Weakness    HPI Travis [name]. is a 73 y.o. male presenting for chief complaint of weakness and shaking. Started about 6-9PM with arm shaking and then started getting confused and weak around 10pm. Hypotensive with EMS Recent cellulitis and finished ABX 4 days ago keflex .  Patient's recorded medical, surgical, social, medication list and allergies were reviewed in the Snapshot window as part of the initial history.   Review of Systems   Review of Systems  Unable to perform ROS: Mental status change    Physical Exam Updated Vital Signs BP 130/64   Pulse 69   Temp 98.5 F (36.9 C) (Oral)   Resp 13   Ht 6' (1.829 m)   Wt 95.3 kg   SpO2 96%   BMI 28.48 kg/m  Physical Exam Vitals and nursing note reviewed.  Constitutional:      General: He is in acute distress.     Appearance: He is well-developed. He is ill-appearing.  HENT:     Head: Normocephalic and atraumatic.     Nose: No congestion or rhinorrhea.     Mouth/Throat:     Mouth: Mucous membranes are moist.     Pharynx: Oropharynx is clear. No oropharyngeal exudate.  Eyes:     Conjunctiva/sclera: Conjunctivae normal.     Pupils: Pupils are equal, round, and reactive to light.  Cardiovascular:     Rate and Rhythm: Normal rate and regular rhythm.     Heart sounds: No murmur heard. Pulmonary:     Effort: Pulmonary effort is normal. No respiratory distress.     Breath sounds: Normal breath sounds.  Abdominal:     Palpations: Abdomen is soft.     Tenderness: There is no abdominal tenderness.  Musculoskeletal:        General: No swelling, tenderness, deformity or signs of injury. Normal range of motion.     Cervical back: Neck supple. No rigidity or tenderness.  Skin:    General: Skin is warm and dry.     Capillary Refill:  Capillary refill takes less than 2 seconds.  Neurological:     General: No focal deficit present.     Mental Status: He is alert and oriented to person, place, and time. Mental status is at baseline.     Cranial Nerves: No cranial nerve deficit.     Motor: No weakness.  Psychiatric:        Mood and Affect: Mood normal.      ED Course/ Medical Decision Making/ A&P    Procedures .Critical Care  Performed by: Jerral Meth, MD Authorized by: Jerral Meth, MD   Critical care provider statement:    Critical care time (minutes):  95   Critical care was necessary to treat or prevent imminent or life-threatening deterioration of the following conditions:  Circulatory failure   Critical care was time spent personally by me on the following activities:  Blood draw for specimens, development of treatment plan with patient or surrogate, discussions with consultants, pulse oximetry, ordering and review of radiographic studies, ordering and review of laboratory studies and ordering and performing treatments and interventions   Care discussed with: admitting provider      Medications Ordered in ED Medications  Chlorhexidine  Gluconate Cloth 2 % PADS 6 each (6 each Topical Given  05/15/24 0800)  docusate sodium  (COLACE) capsule 100 mg (has no administration in time range)  polyethylene glycol (MIRALAX  / GLYCOLAX ) packet 17 g (has no administration in time range)  heparin  injection 5,000 Units (5,000 Units Subcutaneous Given 05/16/24 0621)  pantoprazole  (PROTONIX ) EC tablet 40 mg (40 mg Oral Not Given 05/15/24 1011)  0.9 %  sodium chloride  infusion ( Intravenous Stopped 05/16/24 0617)  insulin  aspart (novoLOG ) injection 0-15 Units ( Subcutaneous Not Given 05/16/24 0445)  vancomycin  (VANCOREADY) IVPB 1750 mg/350 mL (has no administration in time range)  piperacillin -tazobactam (ZOSYN ) IVPB 3.375 g ( Intravenous Infusion Verify 05/16/24 0624)  Oral care mouth rinse (has no administration in  time range)  lactated ringers  infusion ( Intravenous Stopped 05/16/24 0614)  lactated ringers  bolus 1,500 mL (0 mLs Intravenous Stopped 05/15/24 0428)  ceFEPIme  (MAXIPIME ) 2 g in sodium chloride  0.9 % 100 mL IVPB (0 g Intravenous Stopped 05/15/24 0500)  vancomycin  (VANCOREADY) IVPB 2000 mg/400 mL (0 mg Intravenous Stopped 05/15/24 0751)  lactated ringers  bolus 1,000 mL ( Intravenous Infusion Verify 05/15/24 2100)  zolpidem  (AMBIEN ) tablet 5 mg (5 mg Oral Given 05/15/24 2109)    Medical Decision Making:   73 year old male presenting with altered mental status, severe hypotension.  I was called emergently to bedside his arrival. Blood pressures 50s over 30s present and IV fluids with only minimal improvement. Suspect evolving sepsis with septic shock.  Urinary etiology be likely though he was recently admitted with cellulitis which could be a translocation to bacteremia as a possibility. Regardless, he was broadly treated with vancomycin /cefepime  as well as broad treatment with IV fluids for shock. Lab work confirm developing shock. He unfortunately did not respond IV fluids and required aggressive titration up with treatments.  Started on norepinephrine  via PIV with appropriate response.  Patient clinically improving as well.  Discussed with ICU that patient with septic shock.  They agreed with diagnosis and management recommended admission to their unit at this time.  They are coming to evaluate patient.  Clinical Impression:  1. Septic shock (HCC)      Admit   Final Clinical Impression(s) / ED Diagnoses Final diagnoses:  Septic shock [facility])    Rx / DC Orders ED Discharge Orders     None         Jerral Meth, MD 05/16/24 [phone]  "

## 2024-05-15 NOTE — Plan of Care (Signed)
" °  Problem: Metabolic: Goal: Ability to maintain appropriate glucose levels will improve Outcome: Progressing   Problem: Nutritional: Goal: Maintenance of adequate nutrition will improve Outcome: Progressing   Problem: Skin Integrity: Goal: Risk for impaired skin integrity will decrease Outcome: Progressing   Problem: Tissue Perfusion: Goal: Adequacy of tissue perfusion will improve Outcome: Progressing   Problem: Activity: Goal: Risk for activity intolerance will decrease Outcome: Progressing   Problem: Safety: Goal: Ability to remain free from injury will improve Outcome: Progressing   Problem: Skin Integrity: Goal: Risk for impaired skin integrity will decrease Outcome: Progressing   "
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
## 2024-05-16 ENCOUNTER — Inpatient Hospital Stay (HOSPITAL_COMMUNITY)

## 2024-05-16 DIAGNOSIS — R4182 Altered mental status, unspecified: Secondary | ICD-10-CM

## 2024-05-16 DIAGNOSIS — A419 Sepsis, unspecified organism: Secondary | ICD-10-CM

## 2024-05-16 DIAGNOSIS — R6521 Severe sepsis with septic shock: Secondary | ICD-10-CM | POA: Diagnosis not present

## 2024-05-16 DIAGNOSIS — R569 Unspecified convulsions: Secondary | ICD-10-CM

## 2024-05-16 LAB — GLUCOSE, CAPILLARY
Glucose-Capillary: 104 mg/dL — ABNORMAL HIGH (ref 70–99)
Glucose-Capillary: 98 mg/dL (ref 70–99)
Glucose-Capillary: 122 mg/dL — ABNORMAL HIGH (ref 70–99)

## 2024-05-16 LAB — BASIC METABOLIC PANEL WITH GFR
Anion gap: 8 (ref 5–15)
BUN: 18 mg/dL (ref 8–23)
CO2: 24 mmol/L (ref 22–32)
Calcium: 8 mg/dL — ABNORMAL LOW (ref 8.9–10.3)
Chloride: 109 mmol/L (ref 98–111)
Creatinine, Ser: 0.87 mg/dL (ref 0.61–1.24)
GFR, Estimated: >60 mL/min
Glucose, Bld: 99 mg/dL (ref 70–99)
Potassium: 4 mmol/L (ref 3.5–5.1)
Sodium: 140 mmol/L (ref 135–145)

## 2024-05-16 LAB — CBC
HCT: 30.3 % — ABNORMAL LOW (ref 39.0–52.0)
Hemoglobin: 10.2 g/dL — ABNORMAL LOW (ref 13.0–17.0)
MCH: 29.9 pg (ref 26.0–34.0)
MCHC: 33.7 g/dL (ref 30.0–36.0)
MCV: 88.9 fL (ref 80.0–100.0)
Platelets: 183 K/uL (ref 150–400)
RBC: 3.41 MIL/uL — ABNORMAL LOW (ref 4.22–5.81)
RDW: 14.4 % (ref 11.5–15.5)
WBC: 6.1 K/uL (ref 4.0–10.5)
nRBC: 0 % (ref 0.0–0.2)

## 2024-05-16 MED ORDER — PRASUGREL HCL 10 MG PO TABS
5.0000 mg | ORAL_TABLET | Freq: Every day | ORAL | Status: DC
  Administered 2024-05-16 – 2024-05-17 (×2): 5 mg via ORAL
  Filled 2024-05-16 (×3): qty 1

## 2024-05-16 MED ORDER — FAMOTIDINE 20 MG PO TABS
40.0000 mg | ORAL_TABLET | Freq: Every day | ORAL | Status: DC
  Administered 2024-05-16: 40 mg via ORAL
  Filled 2024-05-16: qty 2

## 2024-05-16 MED ORDER — SODIUM CHLORIDE 0.9 % IV SOLN
1.0000 g | INTRAVENOUS | Status: DC
  Administered 2024-05-16: 1 g via INTRAVENOUS
  Filled 2024-05-16: qty 10

## 2024-05-16 MED ORDER — EMTRICITABINE-TENOFOVIR AF 200-25 MG PO TABS
1.0000 | ORAL_TABLET | Freq: Every day | ORAL | Status: DC
  Administered 2024-05-16 – 2024-05-17 (×2): 1 via ORAL
  Filled 2024-05-16 (×2): qty 1

## 2024-05-16 MED ORDER — DULOXETINE HCL 60 MG PO CPEP
60.0000 mg | ORAL_CAPSULE | Freq: Two times a day (BID) | ORAL | Status: DC
  Administered 2024-05-16 – 2024-05-17 (×3): 60 mg via ORAL
  Filled 2024-05-16: qty 2
  Filled 2024-05-16 (×2): qty 1

## 2024-05-16 MED ORDER — ARIPIPRAZOLE 5 MG PO TABS
15.0000 mg | ORAL_TABLET | Freq: Every day | ORAL | Status: DC
  Administered 2024-05-16: 15 mg via ORAL
  Filled 2024-05-16: qty 3

## 2024-05-16 MED ORDER — OXYCODONE HCL 5 MG PO TABS
10.0000 mg | ORAL_TABLET | Freq: Four times a day (QID) | ORAL | Status: DC | PRN
  Administered 2024-05-16 – 2024-05-17 (×2): 10 mg via ORAL
  Filled 2024-05-16 (×2): qty 2

## 2024-05-16 MED ORDER — CELECOXIB 100 MG PO CAPS
100.0000 mg | ORAL_CAPSULE | Freq: Two times a day (BID) | ORAL | Status: DC
  Administered 2024-05-16 – 2024-05-17 (×3): 100 mg via ORAL
  Filled 2024-05-16 (×4): qty 1

## 2024-05-16 MED ORDER — MIRABEGRON ER 50 MG PO TB24
50.0000 mg | ORAL_TABLET | Freq: Every day | ORAL | Status: DC
  Administered 2024-05-16: 50 mg via ORAL
  Filled 2024-05-16 (×2): qty 1

## 2024-05-16 MED ORDER — CLONAZEPAM 0.25 MG PO TBDP: 0.2500 mg | ORAL_TABLET | Freq: Two times a day (BID) | ORAL | Status: DC | PRN

## 2024-05-16 MED ORDER — ATORVASTATIN CALCIUM 10 MG PO TABS
20.0000 mg | ORAL_TABLET | Freq: Every day | ORAL | Status: DC
  Administered 2024-05-16 – 2024-05-17 (×2): 20 mg via ORAL
  Filled 2024-05-16 (×2): qty 2

## 2024-05-16 MED ORDER — ASPIRIN 81 MG PO TBEC
81.0000 mg | DELAYED_RELEASE_TABLET | Freq: Every day | ORAL | Status: DC
  Administered 2024-05-16: 81 mg via ORAL
  Filled 2024-05-16: qty 1

## 2024-05-16 MED ORDER — GABAPENTIN 400 MG PO CAPS
800.0000 mg | ORAL_CAPSULE | Freq: Three times a day (TID) | ORAL | Status: DC
  Administered 2024-05-16 – 2024-05-17 (×4): 800 mg via ORAL
  Filled 2024-05-16: qty 2
  Filled 2024-05-16 (×4): qty 8

## 2024-05-16 MED ORDER — DONEPEZIL HCL 10 MG PO TABS
10.0000 mg | ORAL_TABLET | Freq: Every morning | ORAL | Status: DC
  Administered 2024-05-16 – 2024-05-17 (×2): 10 mg via ORAL
  Filled 2024-05-16 (×2): qty 1

## 2024-05-16 MED ORDER — ZOLPIDEM TARTRATE 5 MG PO TABS
10.0000 mg | ORAL_TABLET | Freq: Every evening | ORAL | Status: DC | PRN
  Administered 2024-05-16: 10 mg via ORAL
  Filled 2024-05-16: qty 2

## 2024-05-16 MED ORDER — PANCRELIPASE (LIP-PROT-AMYL) 12000-38000 UNITS PO CPEP
120000.0000 [IU] | ORAL_CAPSULE | Freq: Three times a day (TID) | ORAL | Status: DC
  Administered 2024-05-16 (×2): 120000 [IU] via ORAL
  Filled 2024-05-16 (×4): qty 10

## 2024-05-16 MED ORDER — TAMSULOSIN HCL 0.4 MG PO CAPS
0.4000 mg | ORAL_CAPSULE | Freq: Two times a day (BID) | ORAL | Status: DC
  Administered 2024-05-16 – 2024-05-17 (×3): 0.4 mg via ORAL
  Filled 2024-05-16 (×3): qty 1

## 2024-05-16 MED ORDER — TRAZODONE HCL 50 MG PO TABS
150.0000 mg | ORAL_TABLET | Freq: Every day | ORAL | Status: DC
  Administered 2024-05-16: 150 mg via ORAL
  Filled 2024-05-16: qty 3

## 2024-05-16 NOTE — Plan of Care (Signed)
 Patient transferred via bed by MICU RN and NA to new room with all belongings. Patient alert and orientated, on Tele, with PIV SL. Patient's bed connected and call bell given to patient. RN and NA from 2W was arriving to room upon MICU RN and NA leaving.
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
## 2024-05-16 NOTE — Progress Notes (Signed)
 [facility] Progress Note Patient: Travis [name] FMW:[id number] DOB: 09-04-1950  DOA: 05/15/2024 DOS: the patient was seen and examined on 05/16/2024  Brief Hospital Course: Patient with PMH of right BKA, CAD, gastric bypass, chronic pain syndrome, depression, HTN, GERD, HLD, OSA, T2DMPresented the hospital with complaints of a confusion episode with tremors. Admitted to the ICU for septic shock secondary to UTI.  Assessment and Plan: Septic shock due to UTI. Hypotension. Presents with low blood pressure with resultant syncope event. Some tremors noted as well. Urine appears to be infected with positive nitrite and pyuria. Receiving IV antibiotic. Treated with IV fluid and IV pressors. Now out of the ICU. Monitor.   Acute kidney injury Baseline creatinine 0.9. Admission and creatinine 1.4. Treated with IV fluid.  Currently normal.   History of CAD status post multiple stents - elevated troponin Likely demand ischemia. Monitor. No chest pain.  Seizure-like event. EEG negative for any active seizure. No focal deficits Monitor.  Seizure ruled out.   DM2 Currently on sliding scale insulin .   Normocytic anemia Hemoglobin 10.0 with normal MCV No evidence of bleeding.   HLD Continue atorvastatin    Chronic pain syndrome Continue usual Percocet dose -[facility]ed at [facility]   Depression/anxiety Continue Cymbalta  and Abilify  at home doses  Urinary retention protection Foley catheter inserted in the ED. Will remove tomorrow. Resume  Myrbetriq . Continue Flomax .  Neuropathy. Continue gabapentin .  PAD. On prasugrel  and aspirin .  Continue.  Subjective: Feeling better.  No nausea, vomiting or fever or chills.  Physical Exam: Clear to auscultation. S1-S2 present Bowel sounds are No edema. No focal deficit.  Data Reviewed: I have Reviewed nursing notes, Vitals, and Lab results. Since last encounter, pertinent  lab results CBC and BMP   . I have ordered test including CBC and BMP  .   Disposition: Status is: Inpatient Remains inpatient appropriate because: Monitor for improvement in blood pressure  SCDs Start: 05/15/24 0646   Family Communication: No one at bedside Level of care: Telemetry   Vitals:   05/16/24 0900 05/16/24 1000 05/16/24 1100 05/16/24 1138  BP: (!) 106/54 110/63 (!) 127/59 (!) 119/53  Pulse: 71 69 67 72  Resp: 19 13 14 20   Temp:    98.3 F (36.8 C)  TempSrc:    Oral  SpO2: 94% 95% 94% 99%  Weight:      Height:         Author: Yetta Blanch, MD 05/16/2024 6:31 PM  Please look on [email or web address] to find out who is on call.

## 2024-05-16 NOTE — Progress Notes (Signed)
 EEG complete - results pending

## 2024-05-16 NOTE — Procedures (Signed)
 Patient Name: Travis DOMBEK Sr.  MRN: [id number]  Epilepsy Attending: Arlin MALVA Krebs  Referring Physician/Provider: Tobie Yetta HERO, MD  Date: 05/16/2024 Duration: 22.38 mins  Patient history: 73yo M with ams. EEG to evaluate for seizure  Level of alertness: Awake  AEDs during EEG study: None  Technical aspects: This EEG study was done with scalp electrodes positioned according to the 10-20 International system of electrode placement. Electrical activity was reviewed with band pass filter of 1-70Hz , sensitivity of 7 uV/mm, display speed of 43mm/sec with a 60Hz  notched filter applied as appropriate. EEG data were recorded continuously and digitally stored.  Video monitoring was available and reviewed as appropriate.  Description: The posterior dominant rhythm consists of 9Hz  activity of moderate voltage (25-35 uV) seen predominantly in posterior head regions, symmetric and reactive to eye opening and eye closing. Hyperventilation and photic stimulation were not performed.     IMPRESSION: This study is within normal limits. No seizures or epileptiform discharges were seen throughout the recording.  A normal interictal EEG does not exclude the diagnosis of epilepsy.   Avree Szczygiel O Sheriff Rodenberg

## 2024-05-17 DIAGNOSIS — R6521 Severe sepsis with septic shock: Secondary | ICD-10-CM | POA: Diagnosis not present

## 2024-05-17 DIAGNOSIS — A419 Sepsis, unspecified organism: Secondary | ICD-10-CM | POA: Diagnosis not present

## 2024-05-17 LAB — CBC WITH DIFFERENTIAL/PLATELET
Abs Immature Granulocytes: 0.01 K/uL (ref 0.00–0.07)
Basophils Absolute: 0 K/uL (ref 0.0–0.1)
Basophils Relative: 1 %
Eosinophils Absolute: 0.3 K/uL (ref 0.0–0.5)
Eosinophils Relative: 8 %
HCT: 28.9 % — ABNORMAL LOW (ref 39.0–52.0)
Hemoglobin: 9.7 g/dL — ABNORMAL LOW (ref 13.0–17.0)
Immature Granulocytes: 0 %
Lymphocytes Relative: 46 %
Lymphs Abs: 1.6 K/uL (ref 0.7–4.0)
MCH: 29.6 pg (ref 26.0–34.0)
MCHC: 33.6 g/dL (ref 30.0–36.0)
MCV: 88.1 fL (ref 80.0–100.0)
Monocytes Absolute: 0.4 K/uL (ref 0.1–1.0)
Monocytes Relative: 13 %
Neutro Abs: 1.1 K/uL — ABNORMAL LOW (ref 1.7–7.7)
Neutrophils Relative %: 32 %
Platelets: 187 K/uL (ref 150–400)
RBC: 3.28 MIL/uL — ABNORMAL LOW (ref 4.22–5.81)
RDW: 14 % (ref 11.5–15.5)
WBC: 3.5 K/uL — ABNORMAL LOW (ref 4.0–10.5)
nRBC: 0 % (ref 0.0–0.2)

## 2024-05-17 LAB — COMPREHENSIVE METABOLIC PANEL WITH GFR
ALT: 7 U/L (ref 0–44)
AST: 21 U/L (ref 15–41)
Albumin: 2.9 g/dL — ABNORMAL LOW (ref 3.5–5.0)
Alkaline Phosphatase: 69 U/L (ref 38–126)
Anion gap: 8 (ref 5–15)
BUN: 13 mg/dL (ref 8–23)
CO2: 23 mmol/L (ref 22–32)
Calcium: 8.2 mg/dL — ABNORMAL LOW (ref 8.9–10.3)
Chloride: 107 mmol/L (ref 98–111)
Creatinine, Ser: 0.79 mg/dL (ref 0.61–1.24)
GFR, Estimated: >60 mL/min
Glucose, Bld: 91 mg/dL (ref 70–99)
Potassium: 4.2 mmol/L (ref 3.5–5.1)
Sodium: 139 mmol/L (ref 135–145)
Total Bilirubin: 0.4 mg/dL (ref 0.0–1.2)
Total Protein: 5 g/dL — ABNORMAL LOW (ref 6.5–8.1)

## 2024-05-17 LAB — GLUCOSE, CAPILLARY
Glucose-Capillary: 104 mg/dL — ABNORMAL HIGH (ref 70–99)
Glucose-Capillary: 85 mg/dL (ref 70–99)

## 2024-05-17 LAB — MAGNESIUM: Magnesium: 2 mg/dL (ref 1.7–2.4)

## 2024-05-17 MED ORDER — CEFADROXIL 500 MG PO CAPS: 500.0000 mg | ORAL_CAPSULE | Freq: Two times a day (BID) | ORAL | 0 refills | Status: AC

## 2024-05-17 MED ORDER — PANCRELIPASE (LIP-PROT-AMYL) 36000-114000 UNITS PO CPEP
120000.0000 [IU] | ORAL_CAPSULE | Freq: Three times a day (TID) | ORAL | Status: DC
  Administered 2024-05-17 (×2): 120000 [IU] via ORAL
  Filled 2024-05-17 (×3): qty 1

## 2024-05-17 NOTE — Plan of Care (Signed)
 Foley catheter removed per order. Discussed post foley spontaneous void protocol: 6-8hr time frame for spontaneous void, if no void bladder scan, notify provider accordingly. Pt verbalizes understanding.   Problem: Education: Goal: Ability to describe self-care measures that may prevent or decrease complications (Diabetes Survival Skills Education) will improve Outcome: Progressing Goal: Individualized Educational Video(s) Outcome: Progressing   Problem: Coping: Goal: Ability to adjust to condition or change in health will improve Outcome: Progressing   Problem: Fluid Volume: Goal: Ability to maintain a balanced intake and output will improve Outcome: Progressing   Problem: Health Behavior/Discharge Planning: Goal: Ability to identify and utilize available resources and services will improve Outcome: Progressing Goal: Ability to manage health-related needs will improve Outcome: Progressing   Problem: Metabolic: Goal: Ability to maintain appropriate glucose levels will improve Outcome: Progressing   Problem: Nutritional: Goal: Maintenance of adequate nutrition will improve Outcome: Progressing Goal: Progress toward achieving an optimal weight will improve Outcome: Progressing   Problem: Skin Integrity: Goal: Risk for impaired skin integrity will decrease Outcome: Progressing   Problem: Tissue Perfusion: Goal: Adequacy of tissue perfusion will improve Outcome: Progressing   Problem: Education: Goal: Knowledge of General Education information will improve Description: Including pain rating scale, medication(s)/side effects and non-pharmacologic comfort measures Outcome: Progressing   Problem: Health Behavior/Discharge Planning: Goal: Ability to manage health-related needs will improve Outcome: Progressing   Problem: Clinical Measurements: Goal: Ability to maintain clinical measurements within normal limits will improve Outcome: Progressing Goal: Will remain free from  infection Outcome: Progressing Goal: Diagnostic test results will improve Outcome: Progressing Goal: Respiratory complications will improve Outcome: Progressing Goal: Cardiovascular complication will be avoided Outcome: Progressing   Problem: Activity: Goal: Risk for activity intolerance will decrease Outcome: Progressing   Problem: Nutrition: Goal: Adequate nutrition will be maintained Outcome: Progressing   Problem: Coping: Goal: Level of anxiety will decrease Outcome: Progressing   Problem: Elimination: Goal: Will not experience complications related to bowel motility Outcome: Progressing Goal: Will not experience complications related to urinary retention Outcome: Progressing   Problem: Pain Managment: Goal: General experience of comfort will improve and/or be controlled Outcome: Progressing   Problem: Safety: Goal: Ability to remain free from injury will improve Outcome: Progressing   Problem: Skin Integrity: Goal: Risk for impaired skin integrity will decrease Outcome: Progressing

## 2024-05-17 NOTE — Progress Notes (Signed)
 Reviewed AVS, patient expressed understanding of medications, MD follow up reviewed.   Removed IV, Site clean, dry and intact.  See LDA for information on wounds at discharge. Patient states all belongings brought to the hospital at time of admission are accounted for and packed to take home.  Patient informed and expressed understanding where to pick up discharge medications.  Nursing Staff contacted to transport patient to entrance A, where family member was waiting in vehicle to transport home.

## 2024-05-17 NOTE — TOC Transition Note (Signed)
 Transition of Care [facility]) - Discharge Note   Patient Details  Name: Travis MILLETTE Sr. MRN: [id number] Date of Birth: 03/10/1951  [facility] CM/SW Contact:  Marval Gell, RN Phone Number: 05/17/2024, 12:23 PM   Clinical Narrative:     Confirmed with [facility] that patient is active for PT OT and notified liaison that he will be discharged today         Patient Goals and CMS Choice            Discharge Placement                       Discharge Plan and Services Additional resources added to the After Visit Summary for                                       Social Drivers of Health (SDOH) Interventions SDOH Screenings   Food Insecurity: No Food Insecurity (05/15/2024)  Housing: High Risk (05/15/2024)  Transportation Needs: No Transportation Needs (05/15/2024)  Utilities: At Risk (05/15/2024)  Depression (PHQ2-9): Medium Risk (08/15/2023)  Financial Resource Strain: High Risk (10/10/2022)   Received from [facility]  Physical Activity: Unknown (10/10/2022)   Received from [facility]  Social Connections: Moderately Isolated (05/15/2024)  Stress: Stress Concern Present (10/10/2022)   Received from [facility]  Tobacco Use: Medium Risk (05/15/2024)     Readmission Risk Interventions     No data to display

## 2024-05-17 NOTE — TOC CM/SW Note (Signed)
 Transition of Care (TOC) CM/SW Note   CSW spoke with Travis [name]. Travis [name] from home alone. CSW informed him that Travis [name] has a recommendation for SNF. Travis [name] said that he wants to decline rehab as he currently has home health with [facility],  2 days per week and feels fine with that support. He said that he would let inpatient case management team know if he changed his mind from home to SNF at discharge.   Inpatient Case Management Team will continue to follow to assist with home health needs at DC.       Patient Goals and CMS Choice        Expected Discharge Plan and Services                                                Prior Living Arrangements/Services                       Activities of Daily Living   ADL Screening (condition at time of admission) Independently performs ADLs?: Yes (appropriate for developmental age) Is the patient deaf or have difficulty hearing?: No Does the patient have difficulty seeing, even when wearing glasses/contacts?: Yes Does the patient have difficulty concentrating, remembering, or making decisions?: No  Permission Sought/Granted                  Emotional Assessment              Admission diagnosis:  Septic shock (HCC) [A41.9, R65.21] Patient Active Problem List   Diagnosis Date Noted   Septic shock (HCC) 05/15/2024   Infection of bursa 01/25/2024   Prepatellar bursitis of right knee 01/25/2024   Abscess in bursa 01/25/2024   Hypotension 12/20/2023   Failed back syndrome of lumbar spine 08/04/2022   Arthropathy of both sacroiliac joints 04/06/2022   Colitis due to enteroinvasive Escherichia coli 11/28/2021   Hypophosphatemia 11/27/2021   BPH (benign prostatic hyperplasia) 11/26/2021   Pancolitis 11/26/2021   Pancreatic insufficiency 11/26/2021   Sepsis (HCC) 11/25/2021   Chronic sinusitis 11/23/2021   Hypertension associated with type 2 diabetes mellitus (HCC) 10/25/2021   Foot callus 10/06/2021   Folliculitis  10/06/2021   Central sleep apnea comorbid with prescribed opioid use 08/17/2021   Obstructive sleep apnea on CPAP 08/17/2021   Chronic intermittent hypoxia with obstructive sleep apnea 08/17/2021   Subjective memory complaints 08/17/2021   Word finding difficulty 08/17/2021   Eating disorder 06/28/2021   Abnormal laboratory test 06/21/2021   COVID-19 virus infection 06/03/2021   Arthritis of left shoulder region    AV block, Mobitz 2 03/18/2021   S/P reverse total shoulder arthroplasty, left 03/17/2021   Iliotibial band syndrome of both sides 03/16/2021   Chronic respiratory failure with hypoxia (HCC) 02/26/2021   Dyspnea 01/14/2021   Nasal turbinate hypertrophy 11/16/2020   Sinusitis 11/02/2020   Memory loss or impairment 10/11/2020   Right arm pain 09/18/2020   Aortic atherosclerosis 09/16/2020   Spondylolisthesis, lumbar region    Status post lumbar spinal fusion 07/27/2020   Nasal sinus polyp 06/16/2020   Sacroiliac joint pain 04/02/2020   Vitamin D  deficiency 02/24/2020   Cough 07/29/2019   Wheezing 07/29/2019   Possible exposure to STD 07/24/2019   Unilateral primary osteoarthritis, left hip 06/06/2019   Status post total replacement of left hip 06/06/2019   S/P laparoscopic  sleeve gastrectomy 03/03/2019   Rash 01/24/2019   Fever 08/15/2018   UTI (urinary tract infection) 08/15/2018   Fall at home, initial encounter 08/15/2018   Normocytic anemia 08/15/2018   Hypokalemia 08/15/2018   Bowel incontinence 07/25/2018   Other spondylosis with radiculopathy, lumbar region 07/16/2018    Class: Chronic   Status post lumbar laminectomy 07/16/2018   Status post THR (total hip replacement) 04/17/2018   Unilateral primary osteoarthritis, right hip    Myocardial infarction (HCC) 02/25/2018   Coronary artery disease 02/25/2018   PAD (peripheral artery disease) 02/25/2018   S/P BKA (below knee amputation) unilateral, right (HCC) 02/25/2018   Sacroiliitis 02/25/2018   SOB  (shortness of breath) 01/03/2018   Acute on chronic heart failure (HCC) 01/03/2018   Severe right groin pain 12/20/2017   Morbid obesity (HCC) 10/09/2017   Low back pain 07/13/2017   Leukoplakia, tongue 01/26/2017   Skin lesion 10/20/2016   S/P unilateral BKA (below knee amputation), right (HCC) 06/14/2016   Charcot foot due to diabetes mellitus (HCC)    Charcot's arthropathy associated with type 2 diabetes mellitus (HCC) 04/11/2016   Encounter for well adult exam with abnormal findings 11/05/2015   Major depression 09/13/2015   S/P TKR (total knee replacement) bilaterally 09/13/2015   GERD (gastroesophageal reflux disease) 09/08/2015   S/P laparoscopic hernia repair 05/11/2015   PPD positive 04/08/2015   Benign neoplasm of descending colon    Benign neoplasm of cecum    Acute blood loss as cause of postoperative anemia    Chronic anticoagulation    Occult blood positive stool 10/17/2014   General weakness 07/14/2014   Urinary incontinence 07/14/2014   Controlled diabetes mellitus with hyperglycemia (HCC) 05/06/2014   Hypotension during surgery 01/30/2014    Class: Acute   Headache 10/15/2013   Spinal stenosis in cervical region 09/26/2013    Class: Chronic   Congenital spinal stenosis of lumbar region 09/26/2013    Class: Chronic   Hand joint pain 06/10/2013   Rotator cuff tear arthropathy of both shoulders 06/10/2013   Skin lesion of cheek 05/01/2013   Pain of right thumb 04/03/2013   Balance disorder 03/12/2013   Gait disorder 03/12/2013   Tremor 03/12/2013   Left hip pain 03/12/2013   Pre-ulcerative corn or callous 02/06/2013   Anxiety 11/12/2011   OSA on CPAP 11/07/2011   Bradycardia 10/20/2011   Insomnia 10/04/2011   Obesity 01/12/2011   Nausea and vomiting 09/28/2010   Type 2 diabetes mellitus with obesity 09/27/2010   ERECTILE DYSFUNCTION, ORGANIC 05/30/2010   Degenerative disc disease, lumbar 04/19/2010   SCIATICA, LEFT 04/19/2010   Chronic pain syndrome  10/27/2009   Hyperlipidemia 07/15/2009   Essential hypertension 06/24/2009   Coronary artery disease involving native coronary artery of native heart without angina pectoris 06/24/2009   Allergic rhinitis 06/24/2009   Urethral stricture 06/24/2009   Osteoarthritis 06/24/2009   SHOULDER PAIN, BILATERAL 06/24/2009   Physical deconditioning 06/24/2009   NEPHROLITHIASIS, HX OF 06/24/2009   PCP:  Ileen [name], NP Pharmacy:   [facility] - [place], [place] - [address] [place] [address] Phone: [phone] Fax: [phone]  [facility] [address] [place], [place] - [address] [place] [address] Phone: [phone] Fax: [phone]  [facility] [address] [place] [address] Phone: [phone] Fax: [phone]     Social Determinants of Health (SDOH) Interventions    Readmission Risk  Interventions     No data to display

## 2024-05-18 LAB — GLUCOSE, CAPILLARY: Glucose-Capillary: 119 mg/dL — ABNORMAL HIGH (ref 70–99)

## 2024-05-18 NOTE — Discharge Summary (Signed)
 " Physician Discharge Summary   Patient: Travis FAUCETT Sr. MRN: [id number] DOB: 02-Jul-1950  Admit date:     05/15/2024  Discharge date: 05/18/2024  Discharge Physician: Yetta Blanch  PCP: Ileen [name], NP  Recommendations at discharge: Follow-up with PCP in 1 week. Follow-up with urology as recommended. Continue straight cath for now on a weekly basis.   Follow-up Information     Schmerge, [name], NP. Schedule an appointment as soon as possible for a visit in 1 week(s).   Specialty: Nurse Practitioner Why: with CBC lab to look at blood counts, with BMP lab to look at kidney/electrolyte numbers Contact information: [address] [place] [address] [phone]         Cam Morene ORN, MD. Schedule an appointment as soon as possible for a visit in 2 week(s).   Specialty: Urology Contact information: [address] [place] [address] [phone]                Hospital Course: Patient with PMH of right BKA, CAD, gastric bypass, chronic pain syndrome, depression, HTN, GERD, HLD, OSA, T2DMPresented the hospital with complaints of a confusion episode with tremors. Admitted to the ICU for septic shock secondary to UTI.   Assessment and Plan: Septic shock due to UTI. Hypotension. Presents with low blood pressure with resultant syncope event. Some tremors noted as well. Urine appears to be infected with positive nitrite and pyuria. Receiving IV antibiotic. Treated with IV fluid and IV pressors. Continue straight cath that he was performing on a monthly basis to switch to weekly basis.   Acute kidney injury Baseline creatinine 0.9. Admission and creatinine 1.4. Treated with IV fluid.  Currently normal.   History of CAD status post multiple stents - elevated troponin Likely demand ischemia. Monitor. No chest pain.   Seizure-like event. EEG negative for any active seizure. No focal deficits Monitor.  Seizure ruled out.    DM2 Currently on sliding scale insulin .   Normocytic anemia Hemoglobin 10.0 with normal MCV No evidence of bleeding.   HLD Continue atorvastatin    Chronic pain syndrome Continue usual Percocet dose -followed at [facility]   Depression/anxiety Continue Cymbalta  and Abilify  at home doses   Urinary retention protection Foley catheter inserted in the ED. History of urinary stricture Resume  Myrbetriq . Continue Flomax . Patient was able to void after removal of the Foley catheter which was inserted in the ED.   Neuropathy. Continue gabapentin .   PAD. On prasugrel  and aspirin .  Continue.    Consultants:  Primary admission PCCM.  Procedures performed:  Foley catheter  DISCHARGE MEDICATION: Allergies as of 05/17/2024       Reactions   Nicotine Shortness Of Breath   Buprenorphine  Rash, Other (See Comments)   Only the patch causes a reaction,   Neosporin [bacitracin -polymyxin B] Other (See Comments)   Wound Dressing Adhesive Other (See Comments)   Rash, hives        Medication List     PAUSE taking these medications    Myrbetriq  50 MG Tb24 tablet Wait to take this until your doctor or other care provider tells you to start again. Generic drug: mirabegron  ER Take 50 mg by mouth at bedtime.       TAKE these medications    ARIPiprazole  15 MG tablet Commonly known as: ABILIFY  Take 15 mg by mouth at bedtime.   aspirin  EC 81 MG tablet Take 81 mg by mouth at bedtime. Swallow  whole.   atorvastatin  20 MG tablet Commonly known as: LIPITOR TAKE ONE TABLET BY MOUTH EVERY DAY What changed: when to take this   cefadroxil  500 MG capsule Commonly known as: DURICEF Take 1 capsule (500 mg total) by mouth 2 (two) times daily for 5 days.   celecoxib  200 MG capsule Commonly known as: CELEBREX  Take 1 capsule (200 mg total) by mouth 2 (two) times daily.   clonazePAM  0.5 MG tablet Commonly known as: KLONOPIN  Take 0.25 mg  by mouth 2 (two) times daily as needed for anxiety.   donepezil  10 MG tablet Commonly known as: ARICEPT  Take 1 tablet (10 mg total) by mouth at bedtime. What changed: when to take this   DULoxetine  60 MG capsule Commonly known as: CYMBALTA  Take 60 mg by mouth 2 (two) times daily.   emtricitabine -tenofovir  200-300 MG tablet Commonly known as: TRUVADA Take 1 tablet by mouth at bedtime.   famotidine  40 MG tablet Commonly known as: PEPCID  Take 40 mg by mouth at bedtime.   Fish Oil 1000 MG Caps Take 1,000 mg by mouth daily.   furosemide  20 MG tablet Commonly known as: LASIX  Take 20 mg by mouth every other day.   gabapentin  800 MG tablet Commonly known as: NEURONTIN  Take 800 mg by mouth 3 (three) times daily.   naloxone 4 MG/0.1ML Liqd nasal spray kit Commonly known as: NARCAN Place 1 spray into the nose daily as needed (overdose).   oxyCODONE -acetaminophen  10-325 MG tablet Commonly known as: PERCOCET Take 1 tablet by mouth every 6 (six) hours as needed. What changed: reasons to take this   pantoprazole  40 MG tablet Commonly known as: PROTONIX  Take 40 mg by mouth in the morning, at noon, and at bedtime.   prasugrel  5 MG Tabs tablet Commonly known as: EFFIENT  Take 1 tablet (5 mg total) by mouth daily. What changed: when to take this   sildenafil  50 MG tablet Commonly known as: Viagra  Take 1 tablet (50 mg total) by mouth daily as needed for erectile dysfunction.   silver  sulfADIAZINE  1 % cream Commonly known as: Silvadene  Apply 1 Application topically daily. What changed:  when to take this reasons to take this   tamsulosin  0.4 MG Caps capsule Commonly known as: FLOMAX  Take 0.4 mg by mouth 2 (two) times daily.   Testosterone  1.62 % Gel place two pumps onto THE SKIN EVERY DAY What changed: See the new instructions.   traZODone  150 MG tablet Commonly known as: DESYREL  Take 1 tablet (150 mg total) by mouth at bedtime.   Zenpep  40000-126000 units  Cpep Generic drug: Pancrelipase  (Lip-Prot-Amyl) Take 3 capsules with meals and 2 capsule with each snack (2-3 snacks/day). Please keep your November appointment for further refills   zolpidem  10 MG tablet Commonly known as: AMBIEN  Take 10 mg by mouth at bedtime as needed for sleep.       Disposition: Home with home health PT OT Family Communication: Wife at bedside Diet recommendation: Regular diet  Discharge Exam: Vitals:   05/16/24 2106 05/17/24 0100 05/17/24 0412 05/17/24 0826  BP: (!) 145/70 138/62 (!) 139/54 129/71  Pulse: (!) 51 63 (!) 50 (!) 48  Resp: 18 20 18 18   Temp: 98.2 F (36.8 C) 98 F (36.7 C) 97.8 F (36.6 C) 97.7 F (36.5 C)  TempSrc: Oral Oral Oral   SpO2: 99% 99% 96% 98%  Weight:      Height:       General: in Mild distress, No Rash Cardiovascular: S1 and S2  Present, No Murmur Respiratory: Good respiratory effort, Bilateral Air entry present. No Crackles, No wheezes Abdomen: Bowel Sound present, No tenderness Extremities: No edema Neuro: Alert and oriented x3, no new focal deficit   Filed Weights   05/15/24 0244 05/16/24 0500  Weight: 95.3 kg 95.3 kg   Condition at discharge: stable  The results of significant diagnostics from this hospitalization (including imaging, microbiology, ancillary and laboratory) are listed below for reference.   Imaging Studies: EEG adult Result Date: 05/16/2024 Shelton Arlin KIDD, MD     05/16/2024 12:06 PM Patient Name: Travis TORAL Sr. MRN: [id number] Epilepsy Attending: Arlin KIDD Shelton Referring Physician/Provider: Tobie Yetta HERO, MD Date: 05/16/2024 Duration: 22.38 mins Patient history: 73yo M with ams. EEG to evaluate for seizure Level of alertness: Awake AEDs during EEG study: None Technical aspects: This EEG study was done with scalp electrodes positioned according to the 10-20 International system of electrode placement. Electrical activity was reviewed with band pass filter of 1-70Hz , sensitivity of 7  uV/mm, display speed of 55mm/sec with a 60Hz  notched filter applied as appropriate. EEG data were recorded continuously and digitally stored.  Video monitoring was available and reviewed as appropriate. Description: The posterior dominant rhythm consists of 9Hz  activity of moderate voltage (25-35 uV) seen predominantly in posterior head regions, symmetric and reactive to eye opening and eye closing. Hyperventilation and photic stimulation were not performed.   IMPRESSION: This study is within normal limits. No seizures or epileptiform discharges were seen throughout the recording. A normal interictal EEG does not exclude the diagnosis of epilepsy. Arlin KIDD Shelton   ECHOCARDIOGRAM COMPLETE Result Date: 05/15/2024    ECHOCARDIOGRAM REPORT   Patient Name:   Travis POND Sr. Date of Exam: 05/15/2024 Medical Rec #:  [id number]               Height:       72.0 in Accession #:    [id number]              Weight:       210.0 lb Date of Birth:  [birth year]                BSA:          2.175 m Patient Age:    73 years                BP:           107/54 mmHg Patient Gender: M                       HR:           56 bpm. Exam Location:  Inpatient Procedure: 2D Echo (Both Spectral and Color Flow Doppler were utilized during            procedure). Indications:    shock  History:        Patient has prior history of Echocardiogram examinations, most                 recent 03/18/2021. PAD; Risk Factors:Diabetes, Hypertension,                 Dyslipidemia and Sleep Apnea.  Sonographer:    Tinnie Barefoot RDCS Referring Phys: [id number] DEWARD ORN HOFFMAN IMPRESSIONS  1. Left ventricular ejection fraction, by estimation, is 60 to 65%. The left ventricle has normal function. The left ventricle has no regional wall motion abnormalities. There is mild concentric left ventricular hypertrophy.  Left ventricular diastolic parameters are consistent with Grade I diastolic dysfunction (impaired relaxation).  2. Right ventricular systolic  function is normal. The right ventricular size is normal. There is moderately elevated pulmonary artery systolic pressure. The estimated right ventricular systolic pressure is 45.5 mmHg.  3. Left atrial size was mild to moderately dilated.  4. Right atrial size was mildly dilated.  5. The mitral valve is normal in structure. Mild mitral valve regurgitation. No evidence of mitral stenosis.  6. The aortic valve is normal in structure. There is mild calcification of the aortic valve. Aortic valve regurgitation is trivial. Aortic valve sclerosis/calcification is present, without any evidence of aortic stenosis.  7. The inferior vena cava is dilated in size with <50% respiratory variability, suggesting right atrial pressure of 15 mmHg. FINDINGS  Left Ventricle: Left ventricular ejection fraction, by estimation, is 60 to 65%. The left ventricle has normal function. The left ventricle has no regional wall motion abnormalities. The left ventricular internal cavity size was normal in size. There is  mild concentric left ventricular hypertrophy. Left ventricular diastolic parameters are consistent with Grade I diastolic dysfunction (impaired relaxation). Right Ventricle: The right ventricular size is normal. No increase in right ventricular wall thickness. Right ventricular systolic function is normal. There is moderately elevated pulmonary artery systolic pressure. The tricuspid regurgitant velocity is 2.76 m/s, and with an assumed right atrial pressure of 15 mmHg, the estimated right ventricular systolic pressure is 45.5 mmHg. Left Atrium: Left atrial size was mild to moderately dilated. Right Atrium: Right atrial size was mildly dilated. Pericardium: There is no evidence of pericardial effusion. Mitral Valve: The mitral valve is normal in structure. Mild mitral valve regurgitation. No evidence of mitral valve stenosis. Tricuspid Valve: The tricuspid valve is normal in structure. Tricuspid valve regurgitation is mild . No  evidence of tricuspid stenosis. Aortic Valve: The aortic valve is normal in structure. There is mild calcification of the aortic valve. Aortic valve regurgitation is trivial. Aortic regurgitation PHT measures 710 msec. Aortic valve sclerosis/calcification is present, without any evidence of aortic stenosis. Pulmonic Valve: The pulmonic valve was normal in structure. Pulmonic valve regurgitation is not visualized. No evidence of pulmonic stenosis. Aorta: The aortic root is normal in size and structure. Venous: The inferior vena cava is dilated in size with less than 50% respiratory variability, suggesting right atrial pressure of 15 mmHg. IAS/Shunts: No atrial level shunt detected by color flow Doppler.  LEFT VENTRICLE PLAX 2D LVIDd:         5.10 cm      Diastology LVIDs:         3.80 cm      LV e' medial:    7.29 cm/s LV PW:         1.20 cm      LV E/e' medial:  9.4 LV IVS:        1.10 cm      LV e' lateral:   9.14 cm/s LVOT diam:     2.20 cm      LV E/e' lateral: 7.5 LV SV:         93 LV SV Index:   43 LVOT Area:     3.80 cm LV IVRT:       129 msec  LV Volumes (MOD) LV vol d, MOD A2C: 128.0 ml LV vol d, MOD A4C: 124.0 ml LV vol s, MOD A2C: 49.1 ml LV vol s, MOD A4C: 53.7 ml LV SV MOD A2C:  78.9 ml LV SV MOD A4C:     124.0 ml LV SV MOD BP:      75.3 ml RIGHT VENTRICLE             IVC RV Basal diam:  3.30 cm     IVC diam: 3.10 cm RV S prime:     13.80 cm/s TAPSE (M-mode): 2.4 cm      PULMONARY VEINS                             Diastolic Velocity: 32.70 cm/s                             S/D Velocity:       1.70                             Systolic Velocity:  55.30 cm/s LEFT ATRIUM           Index        RIGHT ATRIUM           Index LA diam:      3.90 cm 1.79 cm/m   RA Area:     16.40 cm LA Vol (A2C): 73.1 ml 33.61 ml/m  RA Volume:   42.30 ml  19.45 ml/m LA Vol (A4C): 85.7 ml 39.40 ml/m  AORTIC VALVE LVOT Vmax:   103.00 cm/s LVOT Vmean:  66.600 cm/s LVOT VTI:    0.244 m AI PHT:      710 msec  AORTA Ao Root  diam: 3.50 cm MITRAL VALVE               TRICUSPID VALVE MV Area (PHT): 2.66 cm    TR Peak grad:   30.5 mmHg MV Decel Time: 285 msec    TR Vmax:        276.00 cm/s MV E velocity: 68.30 cm/s MV A velocity: 78.50 cm/s  SHUNTS MV E/A ratio:  0.87        Systemic VTI:  0.24 m                            Systemic Diam: 2.20 cm Toribio Fuel MD Electronically signed by Toribio Fuel MD Signature Date/Time: 05/15/2024/11:21:37 AM    Final    CT CHEST ABDOMEN PELVIS WO CONTRAST Result Date: 05/15/2024 EXAM: CT CHEST, ABDOMEN AND PELVIS WITHOUT CONTRAST 05/15/2024 03:22:00 AM TECHNIQUE: CT of the chest, abdomen and pelvis was performed without the administration of intravenous contrast. Multiplanar reformatted images are provided for review. Automated exposure control, iterative reconstruction, and/or weight based adjustment of the mA/kV was utilized to reduce the radiation dose to as low as reasonably achievable. COMPARISON: Comparison with same day x-ray and CT abdomen and pelvis 11/26/2021. CLINICAL HISTORY: Sepsis. FINDINGS: CHEST: MEDIASTINUM AND LYMPH NODES: Heart and pericardium are unremarkable. The central airways are clear. No mediastinal, hilar or axillary lymphadenopathy. Coronary artery and aortic atherosclerotic calcification. LUNGS AND PLEURA: Respiratory motion obscures detail in the lower lungs. Trace right pleural effusion. 1.6 cm ground-glass nodule in the lingula on series 5 image 67. No pneumothorax. No focal consolidation or pulmonary edema. ABDOMEN AND PELVIS: LIVER: The liver is unremarkable. GALLBLADDER AND BILE DUCTS: Cholelithiasis without acute cholecystitis. No biliary ductal dilatation. SPLEEN: No acute abnormality. PANCREAS: No acute abnormality. ADRENAL GLANDS: No acute  abnormality. KIDNEYS, URETERS AND BLADDER: No stones in the kidneys or ureters. No hydronephrosis. No perinephric or periureteral stranding. Urinary bladder is unremarkable. GI AND BOWEL: Postoperative change about  the stomach. Small hiatal hernia. There is no bowel obstruction. REPRODUCTIVE ORGANS: No acute abnormality. PERITONEUM AND RETROPERITONEUM: No ascites. No free air. Streak artifact obscures detail in the pelvis. VASCULATURE: Aorta is normal in caliber. ABDOMINAL AND PELVIS LYMPH NODES: No lymphadenopathy. BONES AND SOFT TISSUES: Left shoulder arthroplasty. Bilateral hip arthroplasties. Posterior lumbar fusion. Advanced disc space height loss and degenerative endplate changes at T3-T4. No focal soft tissue abnormality. IMPRESSION: 1. Trace right pleural effusion. 2. 1.6 cm ground-glass nodule in the lingula; as per Fleischner Society Guidelines, CT at 6-12 months to confirm persistence, then CT every 2 years until 5 years. 3. No acute abnormality in the abdomen or pelvis. Electronically signed by: Norman Gatlin MD 05/15/2024 03:38 AM EST RP Workstation: [id number]   CT HEAD WO CONTRAST ( ) Result Date: 05/15/2024 EXAM: CT HEAD WITHOUT CONTRAST 05/15/2024 03:22:00 AM TECHNIQUE: CT of the head was performed without the administration of intravenous contrast. Automated exposure control, iterative reconstruction, and/or weight based adjustment of the mA/kV was utilized to reduce the radiation dose to as low as reasonably achievable. COMPARISON: CT 05/06/23 CLINICAL HISTORY: Head trauma, minor (Age >= 65y) FINDINGS: BRAIN AND VENTRICLES: No acute hemorrhage. No evidence of acute infarct. No hydrocephalus. No extra-axial collection. No mass effect or midline shift. ORBITS: No acute abnormality. SINUSES: No acute abnormality. SOFT TISSUES AND SKULL: No acute soft tissue abnormality. No skull fracture. IMPRESSION: 1. No acute intracranial abnormality. Electronically signed by: Gilmore Molt 05/15/2024 03:30 AM EST RP Workstation: [id number]   DG Chest Portable 1 View Result Date: 05/15/2024 EXAM: 1 VIEW(S) XRAY OF THE CHEST 05/15/2024 03:13:00 AM COMPARISON: 03/05/2024 CLINICAL HISTORY: AMS FINDINGS: LUNGS AND  PLEURA: Lower lung volumes. No focal consolidation or pleural effusion. No pneumothorax. HEART AND MEDIASTINUM: No acute abnormality of the cardiac and mediastinal silhouettes. BONES AND SOFT TISSUES: Left shoulder arthroplasty. Partially visible lumbar fusion hardware. IMPRESSION: 1. No acute cardiopulmonary abnormality. Electronically signed by: Norman Gatlin MD 05/15/2024 03:18 AM EST RP Workstation: [id number]   XR HIP UNILAT W OR W/O PELVIS 2-3 VIEWS LEFT Result Date: 05/10/2024 AP pelvis lateral radiograph left hip reviewed.  Bilateral total hip prostheses in good position alignment with no complicating factors.   Microbiology: Results for orders placed or performed during the hospital encounter of 05/15/24  Resp panel by RT-PCR (RSV, Flu A&B, Covid) Anterior Nasal Swab     Status: None   Collection Time: 05/15/24  3:02 AM   Specimen: Anterior Nasal Swab  Result Value Ref Range Status   SARS Coronavirus 2 by RT PCR NEGATIVE NEGATIVE Final   Influenza A by PCR NEGATIVE NEGATIVE Final   Influenza B by PCR NEGATIVE NEGATIVE Final    Comment: (NOTE) The Xpert Xpress SARS-CoV-2/FLU/RSV plus assay is intended as an aid in the diagnosis of influenza from Nasopharyngeal swab specimens and should not be used as a sole basis for treatment. Nasal washings and aspirates are unacceptable for Xpert Xpress SARS-CoV-2/FLU/RSV testing.  Fact Sheet for Patients: bloggercourse.com  Fact Sheet for Healthcare Providers: seriousbroker.it  This test is not yet approved or cleared by the [place]  FDA and has been authorized for detection and/or diagnosis of SARS-CoV-2 by FDA under an Emergency Use Authorization (EUA). This EUA will remain in effect (meaning this test can be used) for the duration of the COVID-19 declaration under Section  564(b)(1) of the Act, 21 U.S.C. section 360bbb-3(b)(1), unless the authorization is terminated  or revoked.     Resp Syncytial Virus by PCR NEGATIVE NEGATIVE Final    Comment: (NOTE) Fact Sheet for Patients: bloggercourse.com  Fact Sheet for Healthcare Providers: seriousbroker.it  This test is not yet approved or cleared by the [place]  FDA and has been authorized for detection and/or diagnosis of SARS-CoV-2 by FDA under an Emergency Use Authorization (EUA). This EUA will remain in effect (meaning this test can be used) for the duration of the COVID-19 declaration under Section 564(b)(1) of the Act, 21 U.S.C. section 360bbb-3(b)(1), unless the authorization is terminated or revoked.  Performed at [facility], [address] [place], [place] [address]   Blood culture (routine x 2)     Status: None (Preliminary result)   Collection Time: 05/15/24  3:13 AM   Specimen: BLOOD RIGHT ARM  Result Value Ref Range Status   Specimen Description BLOOD RIGHT ARM  Final   Special Requests   Final    BOTTLES DRAWN AEROBIC AND ANAEROBIC Blood Culture adequate volume   Culture   Final    NO GROWTH 3 DAYS Performed at [facility], [address], [place], [place] [address]    Report Status PENDING  Incomplete  Blood culture (routine x 2)     Status: None (Preliminary result)   Collection Time: 05/15/24  3:56 AM   Specimen: BLOOD RIGHT ARM  Result Value Ref Range Status   Specimen Description BLOOD RIGHT ARM  Final   Special Requests   Final    BOTTLES DRAWN AEROBIC AND ANAEROBIC Blood Culture adequate volume   Culture   Final    NO GROWTH 3 DAYS Performed at [facility], [address] [place], [place] [address]    Report Status PENDING  Incomplete  MRSA Next Gen by PCR, Nasal     Status: Abnormal   Collection Time: 05/15/24  6:46 AM   Specimen: Nasal Mucosa; Nasal Swab  Result Value Ref Range Status   MRSA by PCR Next Gen DETECTED (A) NOT DETECTED Final    Comment: (NOTE) The GeneXpert MRSA Assay  (FDA approved for NASAL specimens only), is one component of a comprehensive MRSA colonization surveillance program. It is not intended to diagnose MRSA infection nor to guide or monitor treatment for MRSA infections. Test performance is not FDA approved in patients less than 13 years old. Performed at [facility], [address], [place], [place] [address]    *Note: Due to a large number of results and/or encounters for the requested time period, some results have not been displayed. A complete set of results can be found in Results Review.   Labs: CBC: Recent Labs  Lab 05/15/24 0245 05/16/24 0258 05/17/24 0545  WBC 7.4 6.1 3.5*  NEUTROABS 5.3  --  1.1*  HGB 10.3* 10.2* 9.7*  HCT 31.4* 30.3* 28.9*  MCV 90.0 88.9 88.1  PLT 186 183 187   Basic Metabolic Panel: Recent Labs  Lab 05/15/24 0245 05/16/24 0258 05/17/24 0545  NA 137 140 139  K 4.2 4.0 4.2  CL 103 109 107  CO2 23 24 23   GLUCOSE 134* 99 91  BUN 25* 18 13  CREATININE 1.44* 0.87 0.79  CALCIUM  7.7* 8.0* 8.2*  MG  --   --  2.0   Liver Function Tests: Recent Labs  Lab 05/15/24 0245 05/17/24 0545  AST 24 21  ALT 7  7  ALKPHOS 86 69  BILITOT 0.5 0.4  PROT 5.5* 5.0*  ALBUMIN  3.3* 2.9*   CBG: Recent Labs  Lab 05/16/24 0310 05/16/24 0749 05/16/24 2107 05/17/24 0059 05/17/24 0826  GLUCAP 104* 98 122* 104* 85    Discharge time spent: 39 minutes  Author: Yetta Blanch, MD  [facility] 05/17/2024   "

## 2024-05-20 LAB — CULTURE, BLOOD (ROUTINE X 2)
Culture: NO GROWTH
Culture: NO GROWTH
Special Requests: ADEQUATE
Special Requests: ADEQUATE

## 2024-05-28 ENCOUNTER — Telehealth: Payer: Self-pay

## 2024-05-28 ENCOUNTER — Encounter: Payer: Self-pay | Admitting: Gastroenterology

## 2024-05-28 ENCOUNTER — Ambulatory Visit: Admitting: Gastroenterology

## 2024-05-28 ENCOUNTER — Ambulatory Visit
Admission: RE | Admit: 2024-05-28 | Discharge: 2024-05-28 | Disposition: A | Discharge status: Home / Self Care | Source: Ambulatory Visit | Attending: Gastroenterology | Admitting: Gastroenterology

## 2024-05-28 VITALS — BP 134/70 | HR 60 | Ht 72.0 in | Wt 211.0 lb

## 2024-05-28 DIAGNOSIS — Z860100 Personal history of colon polyps, unspecified: Secondary | ICD-10-CM

## 2024-05-28 DIAGNOSIS — I5032 Chronic diastolic (congestive) heart failure: Secondary | ICD-10-CM | POA: Diagnosis not present

## 2024-05-28 DIAGNOSIS — Z89511 Acquired absence of right leg below knee: Secondary | ICD-10-CM

## 2024-05-28 DIAGNOSIS — I739 Peripheral vascular disease, unspecified: Secondary | ICD-10-CM

## 2024-05-28 DIAGNOSIS — R194 Change in bowel habit: Secondary | ICD-10-CM | POA: Diagnosis not present

## 2024-05-28 DIAGNOSIS — E1165 Type 2 diabetes mellitus with hyperglycemia: Secondary | ICD-10-CM

## 2024-05-28 DIAGNOSIS — R159 Full incontinence of feces: Secondary | ICD-10-CM | POA: Diagnosis not present

## 2024-05-28 DIAGNOSIS — D649 Anemia, unspecified: Secondary | ICD-10-CM | POA: Diagnosis not present

## 2024-05-28 DIAGNOSIS — E1159 Type 2 diabetes mellitus with other circulatory complications: Secondary | ICD-10-CM

## 2024-05-28 DIAGNOSIS — K8681 Exocrine pancreatic insufficiency: Secondary | ICD-10-CM

## 2024-05-28 DIAGNOSIS — I152 Hypertension secondary to endocrine disorders: Secondary | ICD-10-CM

## 2024-05-28 DIAGNOSIS — I509 Heart failure, unspecified: Secondary | ICD-10-CM

## 2024-05-28 DIAGNOSIS — I2510 Atherosclerotic heart disease of native coronary artery without angina pectoris: Secondary | ICD-10-CM | POA: Diagnosis not present

## 2024-05-28 DIAGNOSIS — G4733 Obstructive sleep apnea (adult) (pediatric): Secondary | ICD-10-CM

## 2024-05-28 MED ORDER — ZENPEP 40000-126000 UNITS PO CPEP: ORAL_CAPSULE | ORAL | 3 refills | Status: AC

## 2024-05-28 MED ORDER — NA SULFATE-K SULFATE-MG SULF 17.5-3.13-1.6 GM/177ML PO SOLN: 1.0000 | Freq: Once | ORAL | 0 refills | Status: AC

## 2024-05-28 NOTE — Progress Notes (Addendum)
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 EGD/colonoscopy was unrevealing.  EGD in 2021 showed gastritis and IDA was thought to be NSAID induced gastritis in the setting of Effient  use.  His hemoglobin baseline is around 10 which is currently stable  History of colon polyps Colonoscopy 10/2018 with 5 tubular adenomas and 1 SSP.  Recall 3 years. Discussed with patient he is not a candidate for cologuards. -- schedule colonoscopy -- I thoroughly discussed the procedure with the patient (at bedside) to include nature of the procedure, alternatives, benefits, and risks (including but not limited to bleeding, infection, perforation, anesthesia/cardiac pulmonary complications).  Patient verbalized understanding and gave verbal consent to proceed with procedure.   Exocrine pancreatic insufficiency On Zenpep  2 with meals 1 at bedtime.  Colonoscopy 2020 with biopsies negative for microscopic colitis -- refill zenpep   CAD s/p multiple PCIs On Effient  (hold x 5 days) -- Will hold Effient   5 days prior to endoscopic procedures - will instruct when and how to resume after procedure. Benefits and risks of procedure explained including risks of bleeding, perforation, infection, missed lesions, reactions to medications  and possible need for hospitalization and surgery for complications. Additional rare but real risk of stroke or other vascular clotting events off Effient  also explained and need to seek urgent help if any signs of these problems occur. Will communicate by phone or EMR with patient's  prescribing provider to confirm that holding Effient  is reasonable in this case.    Chronic diastolic CHF Echo 04/2024 with EF 60 to 65%, no aortic stenosis  Recent admission for septic shock 2/2 UTI from not being compliant with self-cath  Diabetes Right BKA Gastric bypass Chronic pain syndrome   Nestor Mollie DEVONNA [facility] 05/28/2024, 3:58 PM  Cc: Ileen [name], NP "

## 2024-05-28 NOTE — Telephone Encounter (Signed)
 [facility] Pre-operative Risk Assessment     Request for surgical clearance:     Endoscopy Procedure  What type of surgery is being performed?     colonoscopy  When is this surgery scheduled?     06/18/2024  What type of clearance is required ?   Pharmacy  Are there any medications that need to be held prior to surgery and how long? Effient  - 5 days  Practice name and name of physician performing surgery?      [facility]  What is your office phone and fax number?      Phone- [phone]  Fax- [phone]  Anesthesia type (None, local, MAC, general) ?       MAC   Please route your response to [facility]

## 2024-05-28 NOTE — Telephone Encounter (Signed)
"  ° °  Patient Name: Travis [name]  DOB: 28-Feb-1951 MRN: [id number]  Primary Cardiologist: Lonni Cash, MD  Chart reviewed as part of pre-operative protocol coverage. OK to hold Effient  for 5 days as requested. Regarding ASA therapy, we recommend continuation of ASA throughout the perioperative period.  However, if the surgeon feels that cessation of ASA is required in the perioperative period, it may be stopped 5-7 days prior to surgery with a plan to resume it as soon as felt to be feasible from a surgical standpoint in the post-operative period.   [name], PA-C 05/28/2024, 3:38 PM   "

## 2024-05-28 NOTE — Patient Instructions (Addendum)
 We have sent the following medications to your pharmacy for you to pick up at your convenience:  Zenpep   Go to the basement for an X-Ray  You have been scheduled for a colonoscopy. Please follow written instructions given to you at your visit today.   If you use inhalers (even only as needed), please bring them with you on the day of your procedure.  DO NOT TAKE 7 DAYS PRIOR TO TEST- Trulicity (dulaglutide) Ozempic , Wegovy (semaglutide ) Mounjaro , Zepbound  (tirzepatide ) Bydureon Bcise (exanatide extended release)  DO NOT TAKE 1 DAY PRIOR TO YOUR TEST Rybelsus (semaglutide ) Adlyxin (lixisenatide) Victoza (liraglutide) Byetta (exanatide) ___________________________________________________________________________     A high fiber diet with plenty of fluids (up to 8 glasses of water  daily) is suggested to relieve these symptoms.  Metamucil, 1 tablespoon once or twice daily can be used to keep bowels regular if needed.   VISIT SUMMARY:  You came in today to discuss your ongoing issues with fecal incontinence and constipation. We also reviewed your history of precancerous colon polyps and the need for a follow-up colonoscopy.  YOUR PLAN:  -FECAL INCONTINENCE AND CONSTIPATION DUE TO CHRONIC OPIOID USE: Your fecal incontinence and constipation are likely due to your long-term use of opioids and the antidiarrheal medication you are taking. We have ordered an abdominal x-ray to check for any blockages or severe constipation. You should start taking a daily fiber supplement like Citrucel, Benefiber, or Metamucil. If the imaging and colonoscopy do not show any issues, we may consider pelvic floor physical therapy.  -HISTORY OF PRECANCEROUS COLON POLYPS: You have a history of precancerous colon polyps, which means you are at risk for developing colon cancer. You are overdue for a follow-up colonoscopy to check for any new or recurring polyps. We have scheduled a colonoscopy for you and instructed  you to stop taking Effient  five days before the procedure. Your Zenpep  prescription has been refilled.  INSTRUCTIONS:  Please follow up with the abdominal x-ray as soon as possible. Make sure to take a daily fiber supplement and continue with your current medications. Hold Effient  for five days before your scheduled colonoscopy. We will discuss the results of your x-ray and colonoscopy at your next appointment.

## 2024-05-29 ENCOUNTER — Telehealth: Payer: Self-pay

## 2024-05-29 NOTE — Telephone Encounter (Signed)
 Spoke with patient and told him that, per cardiology, he can hold his Effient  for 5 days prior to his procedure.  Patient agreed

## 2024-05-29 NOTE — Progress Notes (Signed)
 Agree with assessment / plan as outlined.

## 2024-05-30 ENCOUNTER — Ambulatory Visit: Admitting: Physician Assistant

## 2024-06-03 ENCOUNTER — Ambulatory Visit: Admitting: Physician Assistant

## 2024-06-03 ENCOUNTER — Encounter: Payer: Self-pay | Admitting: Cardiology

## 2024-06-03 ENCOUNTER — Ambulatory Visit: Attending: Cardiology | Admitting: Cardiology

## 2024-06-03 VITALS — BP 126/68 | HR 57 | Ht 72.0 in | Wt 218.2 lb

## 2024-06-03 DIAGNOSIS — I2510 Atherosclerotic heart disease of native coronary artery without angina pectoris: Secondary | ICD-10-CM | POA: Diagnosis not present

## 2024-06-03 DIAGNOSIS — I10 Essential (primary) hypertension: Secondary | ICD-10-CM | POA: Diagnosis not present

## 2024-06-03 DIAGNOSIS — E785 Hyperlipidemia, unspecified: Secondary | ICD-10-CM

## 2024-06-03 NOTE — Patient Instructions (Signed)
 Medication Instructions:  Your physician recommends that you continue on your current medications as directed. Please refer to the Current Medication list given to you today.  *If you need a refill on your cardiac medications before your next appointment, please call your pharmacy*  Lab Work: At your earliest convenience- Fasting Lipids If you have labs (blood work) drawn today and your tests are completely normal, you will receive your results only by: MyChart Message (if you have MyChart) OR A paper copy in the mail If you have any lab test that is abnormal or we need to change your treatment, we will call you to review the results.  Follow-Up: At [facility], you and your health needs are our priority.  As part of our continuing mission to provide you with exceptional heart care, our providers are all part of one team.  This team includes your primary Cardiologist (physician) and Advanced Practice Providers or APPs (Physician Assistants and Nurse Practitioners) who all work together to provide you with the care you need, when you need it.  Your next appointment:   6 month(s)  Provider:   Lonni Cash, MD

## 2024-06-03 NOTE — Progress Notes (Signed)
 " Cardiology Office Note:  .   Date:  06/03/2024  ID:  Travis FORBES [name]., DOB [birth year], MRN [id number] PCP: Ileen [name], NP  [facility] Providers Cardiologist:  Lonni Cash, MD {  History of Present Illness: .   Travis KARAFFA Sr. is a 74 y.o. male with history of CAD status post multiple PCI's, HTN, HLD, spinal stenosis, right BKA, gastric bypass, OSA, type 2 diabetes, GERD      CAD 2008 Taxus DES placed in the mid LAD in 2008 and  Endeavor DES in the Circumflex 2009 BMS to mid RCA and Xience DES Circumflex .  Last cardiac cath in 2013 with patent stents and non-obstructive CAD  05/2017 normal Myoview   Social history  Former smoker, 1 beer a month, no drugs Lately active 1 to 2 days/week.     Patient with history of multiple remote PCIs.  Last ischemic evaluation 05/2017 with normal Myoview .  He is overdue for follow-up, last seen 12/2022 for preoperative evaluation for SI joint fusion.  No cardiac complaints.  He was at acceptable preoperative risk but no further cardiac evaluation required.  04/2024 he was admitted to the ICU for septic shock secondary to UTI.  Today patient presents for follow-up.  He is accompanied with his partner/fianc today.  Just recently had a birthday, happy birthday.  After his discharge he has been feeling considerably better and without any acute complaints today.  Thinks that he might have a another UTI but already has informed his PCP about this who is managing his care.  Has had no issues of chest pain.  Has no exertional symptoms.  Rarely takes his Lasix  for isolated peripheral edema.  Reports no shortness of breath today.  Reports he is able to ambulate about 1/4 mile but only limited really based off of back pain more than anything else.  He reported in the hospital heart rate would be low but he has no associated symptoms of dizziness, lightheadedness, passing out.  ROS: Denies: Chest pain, shortness of breath,  orthopnea, peripheral edema, palpitations, syncope, decreased exercise capacity, fatigue, dizziness.   Studies Reviewed: .         Risk Assessment/Calculations:            Physical Exam:   VS:  BP 126/68   Pulse (!) 57   Ht 6' (1.829 m)   Wt 218 lb 3.2 oz (99 kg)   SpO2 97%   BMI 29.59 kg/m    Wt Readings from Last 3 Encounters:  06/03/24 218 lb 3.2 oz (99 kg)  05/28/24 211 lb (95.7 kg)  05/16/24 210 lb (95.3 kg)    GEN: Well nourished, well developed in no acute distress NECK: No JVD; No carotid bruits CARDIAC: RRR, 2/6 murmur RESPIRATORY:  Clear to auscultation without rales, wheezing or rhonchi  ABDOMEN: Soft, non-tender, non-distended EXTREMITIES:  No edema; No deformity   ASSESSMENT AND PLAN: .    CAD with history of multiple PCI's Hyperlipidemia -Last cardiac cath in 2013 with patent stents and non-obstructive CAD  No chest pain or anginal equivalents that would prompt further evaluation at this time. Chronically has been on aspirin  and Effient  5 mg.  Will review with his primary cardiologist to see if he still wants him on DAPT therapy, also on Celebrex  for history of arthritis so not ideal and will increase his bleeding risk. Continue with atorvastatin  20 mg daily, repeat fasting lipid panel when he is fasted next.  Preop shoulder arthroplasty  Already addressed on telephone visit but OK to hold Effient  for 5 days as requested. Regarding ASA therapy, we recommend continuation of ASA throughout the perioperative period.  However, if the surgeon feels that cessation of ASA is required in the perioperative period, it may be stopped 5-7 days prior to surgery with a plan to resume it as soon as felt to be feasible from a surgical standpoint in the post-operative period.   Mitral regurgitation 04/2024 echocardiogram with mild MR.  Moderately elevated pulmonary pressure, RVSP 45.5.  Aortic sclerosis noted. Repeat echocardiogram 3 to 5 years.  Hypertension Blood pressure  well-controlled today 126/68.  He is not on any blood pressure medications currently.  Takes as needed Lasix .  Type 2 diabetes Well-controlled A1c 4.2% 01/2024  OSA Has not been on CPAP.  Prior study in 2019 reports mild to moderate CPAP.  His echocardiogram 04/2024 demonstrates preserved biventricular function but he does have elevated pulmonary pressures, RVSP 45.5.SABRA  Would encourage compliance with his CPAP machine at home.    Dispo: 39-month follow-up with Dr. Verlin.  Signed, Travis LITTIE Sluder, PA-C  "

## 2024-06-06 ENCOUNTER — Ambulatory Visit: Payer: Self-pay | Admitting: Gastroenterology

## 2024-06-12 ENCOUNTER — Encounter (HOSPITAL_COMMUNITY): Admission: RE | Payer: Self-pay | Source: Home / Self Care

## 2024-06-12 SURGERY — ARTHROPLASTY, SHOULDER, TOTAL, REVERSE
Anesthesia: General | Site: Shoulder | Laterality: Right

## 2024-06-18 ENCOUNTER — Ambulatory Visit: Admitting: Gastroenterology

## 2024-06-18 ENCOUNTER — Encounter: Payer: Self-pay | Admitting: Gastroenterology

## 2024-06-18 VITALS — BP 136/62 | HR 54 | Temp 97.5°F | Resp 11 | Ht 72.0 in | Wt 218.0 lb

## 2024-06-18 DIAGNOSIS — D123 Benign neoplasm of transverse colon: Secondary | ICD-10-CM | POA: Diagnosis not present

## 2024-06-18 DIAGNOSIS — D120 Benign neoplasm of cecum: Secondary | ICD-10-CM

## 2024-06-18 DIAGNOSIS — Q439 Congenital malformation of intestine, unspecified: Secondary | ICD-10-CM

## 2024-06-18 DIAGNOSIS — Z1211 Encounter for screening for malignant neoplasm of colon: Secondary | ICD-10-CM

## 2024-06-18 DIAGNOSIS — K635 Polyp of colon: Secondary | ICD-10-CM | POA: Diagnosis not present

## 2024-06-18 DIAGNOSIS — K648 Other hemorrhoids: Secondary | ICD-10-CM

## 2024-06-18 DIAGNOSIS — R194 Change in bowel habit: Secondary | ICD-10-CM

## 2024-06-18 DIAGNOSIS — D175 Benign lipomatous neoplasm of intra-abdominal organs: Secondary | ICD-10-CM | POA: Diagnosis not present

## 2024-06-18 DIAGNOSIS — Z860100 Personal history of colon polyps, unspecified: Secondary | ICD-10-CM

## 2024-06-18 DIAGNOSIS — D125 Benign neoplasm of sigmoid colon: Secondary | ICD-10-CM

## 2024-06-18 MED ORDER — SODIUM CHLORIDE 0.9 % IV SOLN: 500.0000 mL | Freq: Once | INTRAVENOUS | Status: DC

## 2024-06-18 NOTE — Progress Notes (Signed)
 Called to room to assist during endoscopic procedure.  Patient ID and intended procedure confirmed with present staff. Received instructions for my participation in the procedure from the performing physician.

## 2024-06-18 NOTE — Progress Notes (Signed)
 Report given to PACU, vss

## 2024-06-18 NOTE — Progress Notes (Signed)
 Surgical Instructions   Your procedure is scheduled on February 2. Report to [facility] Main Entrance A at 5:30 A.M., then check in with the Admitting office. Any questions or running late day of surgery: call [phone]  Questions prior to your surgery date: call [phone], Monday-Friday, 8am-4pm. If you experience any cold or flu symptoms such as cough, fever, chills, shortness of breath, etc. between now and your scheduled surgery, please notify us  at the above number.     Remember:  Do not eat after midnight the night before your surgery  You may drink clear liquids until 4:30 am the morning of your surgery.   Clear liquids allowed are: Water , Non-Citrus Juices (without pulp), Carbonated Beverages, Clear Tea (no milk, honey, etc.), Black Coffee Only (NO MILK, CREAM OR POWDERED CREAMER of any kind), and Gatorade.  Patient Instructions  The night before surgery:  No food after midnight. ONLY clear liquids after midnight   The day of surgery (if you have diabetes): Drink ONE (1) 12 oz G2 given to you in your pre admission testing appointment by 4:30am the morning of surgery. Drink in one sitting. Do not sip.  This drink was given to you during your hospital  pre-op appointment visit.  Nothing else to drink after completing the  12 oz bottle of G2.        If you have questions, please contact your surgeons office.    Take these medicines the morning of surgery with A SIP OF WATER   donepezil  (ARICEPT )  DULoxetine  (CYMBALTA )  gabapentin  (NEURONTIN )  methocarbamol  (ROBAXIN )  pantoprazole  (PROTONIX )  predniSONE  (DELTASONE )  tamsulosin  (FLOMAX )   May take these medicines IF NEEDED: clonazePAM  (KLONOPIN )  oxyCODONE -acetaminophen  (PERCOCET)   One week prior to surgery, STOP taking any  Aleve, Naproxen, Ibuprofen , Motrin , Advil , Goody's, BC's, all herbal medications, Omega-3 Fatty Acids fish oil, and non-prescription vitamins. This includes your celecoxib  (CELEBREX ) .           FOLLOW YOUR SURGEON'S INSTRUCTION'S FOR TAKING OR HOLDING ASPIRIN . IF NO INSTRUCTIONS WERE GIVEN YOU MUST CALL THE OFFICE.   WHAT DO I DO ABOUT MY DIABETES MEDICATION?  Hold Mounjaro  7 days prior to surgery. Take your last dose on or before January 26.  HOW TO MANAGE YOUR DIABETES BEFORE AND AFTER SURGERY  Why is it important to control my blood sugar before and after surgery? Improving blood sugar levels before and after surgery helps healing and can limit problems. A way of improving blood sugar control is eating a healthy diet by:  Eating less sugar and carbohydrates  Increasing activity/exercise  Talking with your doctor about reaching your blood sugar goals High blood sugars (greater than 180 mg/dL) can raise your risk of infections and slow your recovery, so you will need to focus on controlling your diabetes during the weeks before surgery. Make sure that the doctor who takes care of your diabetes knows about your planned surgery including the date and location.  How do I manage my blood sugar before surgery? Check your blood sugar at least 4 times a day, starting 2 days before surgery, to make sure that the level is not too high or low.  Check your blood sugar the morning of your surgery when you wake up and every 2 hours until you get to the Short Stay unit.  If your blood sugar is less than 70 mg/dL, you will need to treat for low blood sugar: Do not take insulin . Treat a low blood sugar (less than 70  mg/dL) with  cup of clear juice (cranberry or apple), 4 glucose tablets, OR glucose gel. Recheck blood sugar in 15 minutes after treatment (to make sure it is greater than 70 mg/dL). If your blood sugar is not greater than 70 mg/dL on recheck, call [phone] for further instructions. Report your blood sugar to the short stay nurse when you get to Short Stay.  If you are admitted to the hospital after surgery: Your blood sugar will be checked by the staff and you will  probably be given insulin  after surgery (instead of oral diabetes medicines) to make sure you have good blood sugar levels. The goal for blood sugar control after surgery is 80-180 mg/dL.           Do NOT Smoke (Tobacco/Vaping) for 24 hours prior to your procedure.  If you use a CPAP at night, you may bring your mask/headgear for your overnight stay.   You will be asked to remove any contacts, glasses, piercing's, hearing aid's, dentures/partials prior to surgery. Please bring cases for these items if needed.    Your surgeon will determine if you are to be admitted or discharged the same day.  Patients discharged the day of surgery will not be allowed to drive home, and someone needs to stay with them for 24 hours.  SURGICAL WAITING ROOM VISITATION Patients may have no more than 2 support people in the waiting area - these visitors may rotate.   Pre-op nurse will coordinate an appropriate time for 2 ADULT support persons, who may not rotate, to accompany patient in pre-op.  Children under the age of 47 must have an adult with them who is not the patient and must remain in the main waiting area with an adult.  If the patient needs to stay at the hospital during part of their recovery, the visitor guidelines for inpatient rooms apply.  Please refer to the [employer] website for the visitor guidelines for any additional information.   If you received a COVID test during your pre-op visit  it is requested that you wear a mask when out in public, stay away from anyone that may not be feeling well and notify your surgeon if you develop symptoms. If you have been in contact with anyone that has tested positive in the last 10 days please notify you surgeon.      Pre-operative 4 CHG Bathing Instructions   You can play a key role in reducing the risk of infection after surgery. Your skin needs to be as free of germs as possible. You can reduce the number of germs on your skin by washing with CHG  (chlorhexidine  gluconate) soap before surgery. CHG is an antiseptic soap that kills germs and continues to kill germs even after washing.   DO NOT use if you have an allergy to chlorhexidine /CHG or antibacterial soaps. If your skin becomes reddened or irritated, stop using the CHG and notify one of our RNs at [phone].   Please shower with the CHG soap starting 4 days before surgery using the following schedule:     Please keep in mind the following:  DO NOT shave, including legs and underarms, starting the day of your first shower.   You may shave your face at any point before/day of surgery.  Place clean sheets on your bed the day you start using CHG soap. Use a clean washcloth (not used since being washed) for each shower. DO NOT sleep with pets once you start using the  CHG.   CHG Shower Instructions:  Wash your face and private area with normal soap. If you choose to wash your hair, wash first with your normal shampoo.  After you use shampoo/soap, rinse your hair and body thoroughly to remove shampoo/soap residue.  Turn the water  OFF and apply  bottle of CHG soap to a CLEAN washcloth.  Apply CHG soap ONLY FROM YOUR NECK DOWN TO YOUR TOES (washing for 3-5 minutes)  DO NOT use CHG soap on face, private areas, open wounds, or sores.  Pay special attention to the area where your surgery is being performed.  If you are having back surgery, having someone wash your back for you may be helpful. Wait 2 minutes after CHG soap is applied, then you may rinse off the CHG soap.  Pat dry with a clean towel  Put on clean clothes/pajamas   If you choose to wear lotion, please use ONLY the CHG-compatible lotions that are listed below.  Additional instructions for the day of surgery:  If you choose, you may shower the morning of surgery with an antibacterial soap.  DO NOT APPLY any lotions, deodorants, cologne, or perfumes.   Do not bring valuables to the hospital. [facility] is not  responsible for any belongings/valuables. Do not wear nail polish, gel polish, artificial nails, or any other type of covering on natural nails (fingers and toes) Do not wear jewelry or makeup Put on clean/comfortable clothes.  Please brush your teeth.  Ask your nurse before applying any prescription medications to the skin.     CHG Compatible Lotions   Aveeno Moisturizing lotion  Cetaphil Moisturizing Cream  Cetaphil Moisturizing Lotion  Clairol Herbal Essence Moisturizing Lotion, Dry Skin  Clairol Herbal Essence Moisturizing Lotion, Extra Dry Skin  Clairol Herbal Essence Moisturizing Lotion, Normal Skin  Curel Age Defying Therapeutic Moisturizing Lotion with Alpha Hydroxy  Curel Extreme Care Body Lotion  Curel Soothing Hands Moisturizing Hand Lotion  Curel Therapeutic Moisturizing Cream, Fragrance-Free  Curel Therapeutic Moisturizing Lotion, Fragrance-Free  Curel Therapeutic Moisturizing Lotion, Original Formula  Eucerin Daily Replenishing Lotion  Eucerin Dry Skin Therapy Plus Alpha Hydroxy Crme  Eucerin Dry Skin Therapy Plus Alpha Hydroxy Lotion  Eucerin Original Crme  Eucerin Original Lotion  Eucerin Plus Crme Eucerin Plus Lotion  Eucerin TriLipid Replenishing Lotion  Keri Anti-Bacterial Hand Lotion  Keri Deep Conditioning Original Lotion Dry Skin Formula Softly Scented  Keri Deep Conditioning Original Lotion, Fragrance Free Sensitive Skin Formula  Keri Lotion Fast Absorbing Fragrance Free Sensitive Skin Formula  Keri Lotion Fast Absorbing Softly Scented Dry Skin Formula  Keri Original Lotion  Keri Skin Renewal Lotion Keri Silky Smooth Lotion  Keri Silky Smooth Sensitive Skin Lotion  Nivea Body Creamy Conditioning Oil  Nivea Body Extra Enriched Lotion  Nivea Body Original Lotion  Nivea Body Sheer Moisturizing Lotion Nivea Crme  Nivea Skin Firming Lotion  NutraDerm 30 Skin Lotion  NutraDerm Skin Lotion  NutraDerm Therapeutic Skin Cream  NutraDerm Therapeutic Skin  Lotion  ProShield Protective Hand Cream  Provon moisturizing lotion                                  [facility]- Preparing for Total Shoulder Arthroplasty   Before surgery, you can play an important role. Because skin is not sterile, your skin needs to be as free of germs as possible. You can reduce the number of germs on your skin by using the  following products. Benzoyl Peroxide Gel Reduces the number of germs present on the skin Applied twice a day to shoulder area starting two days before surgery   Chlorhexidine  Gluconate (CHG) Soap An antiseptic cleaner that kills germs and bonds with the skin to continue killing germs even after washing Used for showering the night before surgery and morning of surgery   Oral Hygiene is also important to reduce your risk of infection.                                    Remember - BRUSH YOUR TEETH THE MORNING OF SURGERY WITH YOUR REGULAR TOOTHPASTE  ==================================================================  Please follow these instructions carefully:  BENZOYL PEROXIDE 5% GEL  Please do not use if you have an allergy to benzoyl peroxide.   If your skin becomes reddened/irritated stop using the benzoyl peroxide.  Starting two days before surgery, apply as follows: Apply benzoyl peroxide in the morning and at night. Apply after taking a shower. If you are not taking a shower clean entire shoulder front, back, and side along with the armpit with a clean wet washcloth.  Place a quarter-sized dollop on your shoulder and rub in thoroughly, making sure to cover the front, back, and side of your shoulder, along with the armpit.   2 days before ____ AM   ____ PM              1 day before ____ AM   ____ PM                             Do this twice a day for two days.  (Last application is the night before surgery, AFTER using the CHG soap as described below).  Do NOT apply benzoyl peroxide gel on the day of surgery.  CHLORHEXIDINE  GLUCONATE  (CHG) SOAP  Please do not use if you have an allergy to CHG or antibacterial soaps. If your skin becomes reddened/irritated stop using the CHG.   Do not shave (including legs and underarms) for at least 48 hours prior to first CHG shower. It is OK to shave your face.  Starting the night before surgery, use CHG soap as follows:  Shower the NIGHT BEFORE SURGERY and MORNING OF SURGERY with CHG.  If you choose to wash your hair, wash your hair first as usual with your normal shampoo.  After shampooing, rinse your hair and body thoroughly to remove the shampoo.  Use CHG as you would any other liquid soap.  You can apply CHG directly to the skin and wash gently with a scrungie or a clean washcloth.  Apply the CHG soap to your body ONLY FROM THE NECK DOWN.  Do not use on open wounds or open sores.  Avoid contact with your eyes, ears, mouth, and genitals (private parts).  Wash face and genitals (private parts) with your normal soap.  Wash thoroughly, paying special attention to the area where your surgery will be performed.  Thoroughly rinse your body with warm water  from the neck down.  DO NOT shower/wash with your normal soap after using and rinsing off the CHG soap.   Pat yourself dry with a CLEAN TOWEL.    Apply benzoyl peroxide.   Wear CLEAN PAJAMAS to bed the night before surgery; wear comfortable clothes the morning of surgery.  Place CLEAN SHEETS on your bed the  night of your first shower and DO NOT SLEEP WITH PETS.  Day of Surgery: Shower as above Do not apply any deodorants/lotions.  Please wear clean clothes to the hospital/surgery center.   Remember to brush your teeth WITH YOUR REGULAR TOOTHPASTE.   Please read over the following fact sheets that you were given.

## 2024-06-18 NOTE — Progress Notes (Signed)
 History and Physical Interval Note: seen on 11/7 - no interval changes. LAst exam 10/2018 - 6 polyps, overdue for surveillance. Some altered bowels recently. Told to take fiber daily. He reports bowels have been doing much better since taking the fiber.   Effient  held for 5 days for this exam. Otherwise feels well today without cardiopulmonary symptoms  06/18/2024 1:54 PM  Travis [name].  has presented today for endoscopic procedure(s), with the diagnosis of  Encounter Diagnoses  Name Primary?   History of colon polyps Yes   Altered bowel habits   .  The various methods of evaluation and treatment have been discussed with the patient and/or family. After consideration of risks, benefits and other options for treatment, the patient has consented to  the endoscopic procedure(s).   The patient's history has been reviewed, patient examined, no change in status, stable for surgery.  I have reviewed the patient's chart and labs. The patient was provided an opportunity to ask questions and all were answered. The patient agreed with the plan.    Marcey Naval, MD [facility]

## 2024-06-18 NOTE — Op Note (Signed)
 [facility] Patient Name: Travis [name] Procedure Date: 06/18/2024 1:44 PM MRN: [id number] Endoscopist: Elspeth P. Leigh , MD, [id number] Age: 74 Referring MD:  Date of Birth: 1950/07/21 Gender: Male Account #: [id number] Procedure:                Colonoscopy Indications:              High risk colon cancer surveillance: Personal                            history of colonic polyps - 6 polyps removed                            10/2018, altered bowel habits - improved with fiber                            supplement since office visit. Medicines:                Monitored Anesthesia Care Procedure:                Pre-Anesthesia Assessment:                           - Prior to the procedure, a History and Physical                            was performed, and patient medications and                            allergies were reviewed. The patient's tolerance of                            previous anesthesia was also reviewed. The risks                            and benefits of the procedure and the sedation                            options and risks were discussed with the patient.                            All questions were answered, and informed consent                            was obtained. Prior Anticoagulants: The patient has                            taken Effient  (prasugrel ), last dose was 5 days                            prior to procedure. ASA Grade Assessment: III - A                            patient with severe systemic disease. After  reviewing the risks and benefits, the patient was                            deemed in satisfactory condition to undergo the                            procedure.                           After obtaining informed consent, the colonoscope                            was passed under direct vision. Throughout the                            procedure, the patient's blood pressure, pulse, and                             oxygen  saturations were monitored continuously. The                            CF [id number] #[id number] was introduced through the anus                            and advanced to the the cecum, identified by                            appendiceal orifice and ileocecal valve. The                            colonoscopy was performed without difficulty. The                            patient tolerated the procedure well. The quality                            of the bowel preparation was adequate. The                            ileocecal valve, appendiceal orifice, and rectum                            were photographed. Scope In: 2:03:25 PM Scope Out: 2:28:23 PM Scope Withdrawal Time: 0 hours 20 minutes 24 seconds  Total Procedure Duration: 0 hours 24 minutes 58 seconds  Findings:                 The perianal and digital rectal examinations were                            normal.                           A 3 to 4 mm polyp was found in the cecum. The polyp  was sessile. The polyp was removed with a cold                            snare. Resection and retrieval were complete.                           There was a medium-sized lipoma, at the hepatic                            flexure.                           Two sessile polyps were found in the transverse                            colon. The polyps were 2 to 3 mm in size. These                            polyps were removed with a cold snare. Resection                            and retrieval were complete.                           A 3 mm polyp was found in the sigmoid colon. The                            polyp was sessile. The polyp was removed with a                            cold snare. Resection and retrieval were complete.                           Internal hemorrhoids were found during retroflexion.                           The colon was tortuous.                           The exam was  otherwise without abnormality. Complications:            No immediate complications. Estimated blood loss:                            Minimal. Estimated Blood Loss:     Estimated blood loss was minimal. Impression:               - One 3 to 4 mm polyp in the cecum, removed with a                            cold snare. Resected and retrieved.                           - Medium-sized lipoma at the hepatic flexure.                           -  Two 2 to 3 mm polyps in the transverse colon,                            removed with a cold snare. Resected and retrieved.                           - One 3 mm polyp in the sigmoid colon, removed with                            a cold snare. Resected and retrieved.                           - Internal hemorrhoids.                           - Tortuous colon.                           - The examination was otherwise normal. Recommendation:           - Patient has a contact number available for                            emergencies. The signs and symptoms of potential                            delayed complications were discussed with the                            patient. Return to normal activities tomorrow.                            Written discharge instructions were provided to the                            patient.                           - Resume previous diet.                           - Continue present medications.                           - Resume Effient  tomorrow.                           - Await pathology results. Elspeth P. Leigh, MD 06/18/2024 2:33:36 PM This report has been signed electronically.

## 2024-06-18 NOTE — Patient Instructions (Signed)
 YOU HAD AN ENDOSCOPIC PROCEDURE TODAY AT THE [facility]:   Refer to the procedure report that was given to you for any specific questions about what was found during the examination.  If the procedure report does not answer your questions, please call your gastroenterologist to clarify.  If you requested that your care partner not be given the details of your procedure findings, then the procedure report has been included in a sealed envelope for you to review at your convenience later.  YOU SHOULD EXPECT: Some feelings of bloating in the abdomen. Passage of more gas than usual.  Walking can help get rid of the air that was put into your GI tract during the procedure and reduce the bloating. If you had a lower endoscopy (such as a colonoscopy or flexible sigmoidoscopy) you may notice spotting of blood in your stool or on the toilet paper. If you underwent a bowel prep for your procedure, you may not have a normal bowel movement for a few days.  Please Note:  You might notice some irritation and congestion in your nose or some drainage.  This is from the oxygen  used during your procedure.  There is no need for concern and it should clear up in a day or so.  SYMPTOMS TO REPORT IMMEDIATELY:  Following lower endoscopy (colonoscopy or flexible sigmoidoscopy):  Excessive amounts of blood in the stool  Significant tenderness or worsening of abdominal pains  Swelling of the abdomen that is new, acute  Fever of 100F or higher   Resume previous diet Continue present medications Resume Effient  tomorrow Await pathology results Handouts on polyps and hemorrhoids given  For urgent or emergent issues, a gastroenterologist can be reached at any hour by calling [phone]. Do not use MyChart messaging for urgent concerns.    DIET:  We do recommend a small meal at first, but then you may proceed to your regular diet.  Drink plenty of fluids but you should avoid alcoholic beverages for 24  hours.  ACTIVITY:  You should plan to take it easy for the rest of today and you should NOT DRIVE or use heavy machinery until tomorrow (because of the sedation medicines used during the test).    FOLLOW UP: Our staff will call the number listed on your records the next business day following your procedure.  We will call around 7:15- 8:00 am to check on you and address any questions or concerns that you may have regarding the information given to you following your procedure. If we do not reach you, we will leave a message.     If any biopsies were taken you will be contacted by phone or by letter within the next 1-3 weeks.  Please call us  at [phone] if you have not heard about the biopsies in 3 weeks.    SIGNATURES/CONFIDENTIALITY: You and/or your care partner have signed paperwork which will be entered into your electronic medical record.  These signatures attest to the fact that that the information above on your After Visit Summary has been reviewed and is understood.  Full responsibility of the confidentiality of this discharge information lies with you and/or your care-partner.

## 2024-06-18 NOTE — Progress Notes (Signed)
 Pt's states no medical or surgical changes since previsit or office visit.

## 2024-06-19 ENCOUNTER — Encounter: Payer: Self-pay | Admitting: Surgical

## 2024-06-19 ENCOUNTER — Telehealth: Payer: Self-pay

## 2024-06-19 ENCOUNTER — Encounter (HOSPITAL_COMMUNITY): Payer: Self-pay | Admitting: Orthopedic Surgery

## 2024-06-19 ENCOUNTER — Encounter (HOSPITAL_COMMUNITY)
Admission: RE | Admit: 2024-06-19 | Discharge: 2024-06-19 | Disposition: A | Discharge status: Home / Self Care | Source: Ambulatory Visit | Attending: Orthopedic Surgery | Admitting: Orthopedic Surgery

## 2024-06-19 ENCOUNTER — Other Ambulatory Visit: Payer: Self-pay

## 2024-06-19 VITALS — BP 135/63 | HR 67 | Temp 98.1°F | Resp 17 | Ht 72.0 in | Wt 213.0 lb

## 2024-06-19 DIAGNOSIS — J449 Chronic obstructive pulmonary disease, unspecified: Secondary | ICD-10-CM | POA: Insufficient documentation

## 2024-06-19 DIAGNOSIS — F32A Depression, unspecified: Secondary | ICD-10-CM | POA: Diagnosis not present

## 2024-06-19 DIAGNOSIS — Z7982 Long term (current) use of aspirin: Secondary | ICD-10-CM | POA: Insufficient documentation

## 2024-06-19 DIAGNOSIS — Z981 Arthrodesis status: Secondary | ICD-10-CM | POA: Insufficient documentation

## 2024-06-19 DIAGNOSIS — L97819 Non-pressure chronic ulcer of other part of right lower leg with unspecified severity: Secondary | ICD-10-CM | POA: Diagnosis not present

## 2024-06-19 DIAGNOSIS — E119 Type 2 diabetes mellitus without complications: Secondary | ICD-10-CM | POA: Diagnosis not present

## 2024-06-19 DIAGNOSIS — Z01812 Encounter for preprocedural laboratory examination: Secondary | ICD-10-CM | POA: Insufficient documentation

## 2024-06-19 DIAGNOSIS — Z7952 Long term (current) use of systemic steroids: Secondary | ICD-10-CM | POA: Insufficient documentation

## 2024-06-19 DIAGNOSIS — Z87891 Personal history of nicotine dependence: Secondary | ICD-10-CM | POA: Diagnosis not present

## 2024-06-19 DIAGNOSIS — I2510 Atherosclerotic heart disease of native coronary artery without angina pectoris: Secondary | ICD-10-CM | POA: Insufficient documentation

## 2024-06-19 DIAGNOSIS — Z8619 Personal history of other infectious and parasitic diseases: Secondary | ICD-10-CM | POA: Diagnosis not present

## 2024-06-19 DIAGNOSIS — Z79899 Other long term (current) drug therapy: Secondary | ICD-10-CM | POA: Insufficient documentation

## 2024-06-19 DIAGNOSIS — Z01818 Encounter for other preprocedural examination: Secondary | ICD-10-CM

## 2024-06-19 DIAGNOSIS — Z955 Presence of coronary angioplasty implant and graft: Secondary | ICD-10-CM | POA: Diagnosis not present

## 2024-06-19 DIAGNOSIS — D649 Anemia, unspecified: Secondary | ICD-10-CM | POA: Diagnosis not present

## 2024-06-19 DIAGNOSIS — I10 Essential (primary) hypertension: Secondary | ICD-10-CM | POA: Insufficient documentation

## 2024-06-19 DIAGNOSIS — Z9884 Bariatric surgery status: Secondary | ICD-10-CM | POA: Diagnosis not present

## 2024-06-19 DIAGNOSIS — K219 Gastro-esophageal reflux disease without esophagitis: Secondary | ICD-10-CM | POA: Insufficient documentation

## 2024-06-19 DIAGNOSIS — Z96612 Presence of left artificial shoulder joint: Secondary | ICD-10-CM | POA: Insufficient documentation

## 2024-06-19 DIAGNOSIS — G894 Chronic pain syndrome: Secondary | ICD-10-CM | POA: Diagnosis not present

## 2024-06-19 DIAGNOSIS — Z7985 Long-term (current) use of injectable non-insulin antidiabetic drugs: Secondary | ICD-10-CM | POA: Insufficient documentation

## 2024-06-19 DIAGNOSIS — Z89511 Acquired absence of right leg below knee: Secondary | ICD-10-CM | POA: Insufficient documentation

## 2024-06-19 DIAGNOSIS — G4733 Obstructive sleep apnea (adult) (pediatric): Secondary | ICD-10-CM | POA: Diagnosis not present

## 2024-06-19 DIAGNOSIS — E785 Hyperlipidemia, unspecified: Secondary | ICD-10-CM | POA: Insufficient documentation

## 2024-06-19 DIAGNOSIS — Z87440 Personal history of urinary (tract) infections: Secondary | ICD-10-CM | POA: Insufficient documentation

## 2024-06-19 DIAGNOSIS — F419 Anxiety disorder, unspecified: Secondary | ICD-10-CM | POA: Insufficient documentation

## 2024-06-19 DIAGNOSIS — Z7902 Long term (current) use of antithrombotics/antiplatelets: Secondary | ICD-10-CM | POA: Insufficient documentation

## 2024-06-19 LAB — BASIC METABOLIC PANEL WITH GFR
Anion gap: 13 (ref 5–15)
BUN: 17 mg/dL (ref 8–23)
CO2: 24 mmol/L (ref 22–32)
Calcium: 8.4 mg/dL — ABNORMAL LOW (ref 8.9–10.3)
Chloride: 104 mmol/L (ref 98–111)
Creatinine, Ser: 1.15 mg/dL (ref 0.61–1.24)
GFR, Estimated: >60 mL/min
Glucose, Bld: 118 mg/dL — ABNORMAL HIGH (ref 70–99)
Potassium: 3.7 mmol/L (ref 3.5–5.1)
Sodium: 141 mmol/L (ref 135–145)

## 2024-06-19 LAB — URINALYSIS, W/ REFLEX TO CULTURE (INFECTION SUSPECTED)
Bilirubin Urine: NEGATIVE
Glucose, UA: NEGATIVE mg/dL
Hgb urine dipstick: NEGATIVE
Ketones, ur: NEGATIVE mg/dL
Leukocytes,Ua: NEGATIVE
Nitrite: NEGATIVE
Protein, ur: NEGATIVE mg/dL
Specific Gravity, Urine: 1.01 (ref 1.005–1.030)
pH: 5 (ref 5.0–8.0)

## 2024-06-19 LAB — CBC
HCT: 36.5 % — ABNORMAL LOW (ref 39.0–52.0)
Hemoglobin: 12.4 g/dL — ABNORMAL LOW (ref 13.0–17.0)
MCH: 29.5 pg (ref 26.0–34.0)
MCHC: 34 g/dL (ref 30.0–36.0)
MCV: 86.7 fL (ref 80.0–100.0)
Platelets: 198 10*3/uL (ref 150–400)
RBC: 4.21 MIL/uL — ABNORMAL LOW (ref 4.22–5.81)
RDW: 14.5 % (ref 11.5–15.5)
WBC: 3.8 10*3/uL — ABNORMAL LOW (ref 4.0–10.5)
nRBC: 0 % (ref 0.0–0.2)

## 2024-06-19 LAB — SURGICAL PCR SCREEN
MRSA, PCR: POSITIVE — AB
Staphylococcus aureus: POSITIVE — AB

## 2024-06-19 LAB — HEMOGLOBIN A1C
Hgb A1c MFr Bld: 5.2 % (ref 4.8–5.6)
Mean Plasma Glucose: 102.54 mg/dL

## 2024-06-19 NOTE — Progress Notes (Signed)
 PCP - Rosaline Ileen PIETY Cardiologist - Lonni Verlin COME  PPM/ICD - denies Device Orders -  Rep Notified -   Chest x-ray - na EKG - 05/15/24 Stress Test - 03/26/18 ECHO - 05/15/24 Cardiac Cath - 2013  Sleep Study - +OSA CPAP - does not wear his CPAP  Fasting Blood Sugar - pt does not check blood sugar. Checks Blood Sugar _____ times a day  Last dose of GLP1 agonist-  1/19. GLP1 instructions: Hold Mounjaro  7 days prior to surgery.  Blood Thinner Instructions:pt states he had stopped Effient  on 1/22. Per his cardiologist Effient  could be held 5 days.  Aspirin  Instructions:Pt states his last dose of aspirin  was 1/22. Per cardiology aspirin  could be hel 5-7 days.  ERAS Protcol -clears until 0430 PRE-SURGERY Ensure or G2- G2  COVID TEST- na   Anesthesia review: yes-hx CAD,multiple PCI's HTN,HLD,OSA. Pt denies having diabetes after gastric bypass.  Patient denies shortness of breath, fever, cough and chest pain at PAT appointment   All instructions explained to the patient, with a verbal understanding of the material. Patient agrees to go over the instructions while at home for a better understanding. The opportunity to ask questions was provided.

## 2024-06-19 NOTE — Telephone Encounter (Signed)
" °  Follow up Call-     06/18/2024    1:34 PM  Call back number  Post procedure Call Back phone  # [phone]  Permission to leave phone message Yes     Patient questions:  Do you have a fever, pain , or abdominal swelling? No. Pain Score  0 *  Have you tolerated food without any problems? Yes.    Have you been able to return to your normal activities? Yes.    Do you have any questions about your discharge instructions: Diet   No. Medications  No. Follow up visit  No.  Do you have questions or concerns about your Care? No.  Actions: * If pain score is 4 or above: No action needed, pain <4.   "

## 2024-06-19 NOTE — Progress Notes (Signed)
 Surgical Instructions   Your procedure is scheduled on February 3. Report to [facility] Main Entrance A at 5:30 A.M., then check in with the Admitting office. Any questions or running late day of surgery: call [phone]  Questions prior to your surgery date: call [phone], Monday-Friday, 8am-4pm. If you experience any cold or flu symptoms such as cough, fever, chills, shortness of breath, etc. between now and your scheduled surgery, please notify us  at the above number.     Remember:  Do not eat after midnight the night before your surgery  You may drink clear liquids until 4:30 am the morning of your surgery.   Clear liquids allowed are: Water , Non-Citrus Juices (without pulp), Carbonated Beverages, Clear Tea (no milk, honey, etc.), Black Coffee Only (NO MILK, CREAM OR POWDERED CREAMER of any kind), and Gatorade.  Patient Instructions  The night before surgery:  No food after midnight. ONLY clear liquids after midnight   The day of surgery (if you have diabetes): Drink ONE (1) 12 oz G2 given to you in your pre admission testing appointment by 4:30am the morning of surgery. Drink in one sitting. Do not sip.  This drink was given to you during your hospital  pre-op appointment visit.  Nothing else to drink after completing the  12 oz bottle of G2.        If you have questions, please contact your surgeons office.    Take these medicines the morning of surgery with A SIP OF WATER   donepezil  (ARICEPT )  DULoxetine  (CYMBALTA )  gabapentin  (NEURONTIN )  methocarbamol  (ROBAXIN )  pantoprazole  (PROTONIX )  predniSONE  (DELTASONE )  tamsulosin  (FLOMAX )   May take these medicines IF NEEDED: clonazePAM  (KLONOPIN )  oxyCODONE -acetaminophen  (PERCOCET)   One week prior to surgery, STOP taking any  Aleve, Naproxen, Ibuprofen , Motrin , Advil , Goody's, BC's, all herbal medications, Omega-3 Fatty Acids fish oil, and non-prescription vitamins. This includes your celecoxib  (CELEBREX ) .           FOLLOW YOUR SURGEON'S INSTRUCTION'S FOR TAKING OR HOLDING ASPIRIN . IF NO INSTRUCTIONS WERE GIVEN YOU MUST CALL THE OFFICE.   WHAT DO I DO ABOUT MY DIABETES MEDICATION?  Hold Mounjaro  7 days prior to surgery. Take your last dose on or before January 26.  HOW TO MANAGE YOUR DIABETES BEFORE AND AFTER SURGERY  Why is it important to control my blood sugar before and after surgery? Improving blood sugar levels before and after surgery helps healing and can limit problems. A way of improving blood sugar control is eating a healthy diet by:  Eating less sugar and carbohydrates  Increasing activity/exercise  Talking with your doctor about reaching your blood sugar goals High blood sugars (greater than 180 mg/dL) can raise your risk of infections and slow your recovery, so you will need to focus on controlling your diabetes during the weeks before surgery. Make sure that the doctor who takes care of your diabetes knows about your planned surgery including the date and location.  How do I manage my blood sugar before surgery? Check your blood sugar at least 4 times a day, starting 2 days before surgery, to make sure that the level is not too high or low.  Check your blood sugar the morning of your surgery when you wake up and every 2 hours until you get to the Short Stay unit.  If your blood sugar is less than 70 mg/dL, you will need to treat for low blood sugar: Do not take insulin . Treat a low blood sugar (less than 70  mg/dL) with  cup of clear juice (cranberry or apple), 4 glucose tablets, OR glucose gel. Recheck blood sugar in 15 minutes after treatment (to make sure it is greater than 70 mg/dL). If your blood sugar is not greater than 70 mg/dL on recheck, call [phone] for further instructions. Report your blood sugar to the short stay nurse when you get to Short Stay.  If you are admitted to the hospital after surgery: Your blood sugar will be checked by the staff and you will  probably be given insulin  after surgery (instead of oral diabetes medicines) to make sure you have good blood sugar levels. The goal for blood sugar control after surgery is 80-180 mg/dL.           Do NOT Smoke (Tobacco/Vaping) for 24 hours prior to your procedure.  If you use a CPAP at night, you may bring your mask/headgear for your overnight stay.   You will be asked to remove any contacts, glasses, piercing's, hearing aid's, dentures/partials prior to surgery. Please bring cases for these items if needed.    Your surgeon will determine if you are to be admitted or discharged the same day.  Patients discharged the day of surgery will not be allowed to drive home, and someone needs to stay with them for 24 hours.  SURGICAL WAITING ROOM VISITATION Patients may have no more than 2 support people in the waiting area - these visitors may rotate.   Pre-op nurse will coordinate an appropriate time for 2 ADULT support persons, who may not rotate, to accompany patient in pre-op.  Children under the age of 14 must have an adult with them who is not the patient and must remain in the main waiting area with an adult.  If the patient needs to stay at the hospital during part of their recovery, the visitor guidelines for inpatient rooms apply.  Please refer to the [facility] website for the visitor guidelines for any additional information.   If you received a COVID test during your pre-op visit  it is requested that you wear a mask when out in public, stay away from anyone that may not be feeling well and notify your surgeon if you develop symptoms. If you have been in contact with anyone that has tested positive in the last 10 days please notify you surgeon.      Pre-operative 4 CHG Bathing Instructions   You can play a key role in reducing the risk of infection after surgery. Your skin needs to be as free of germs as possible. You can reduce the number of germs on your skin by washing with CHG  (chlorhexidine  gluconate) soap before surgery. CHG is an antiseptic soap that kills germs and continues to kill germs even after washing.   DO NOT use if you have an allergy to chlorhexidine /CHG or antibacterial soaps. If your skin becomes reddened or irritated, stop using the CHG and notify one of our RNs at [phone].   Please shower with the CHG soap starting 4 days before surgery using the following schedule:     Please keep in mind the following:  DO NOT shave, including legs and underarms, starting the day of your first shower.   You may shave your face at any point before/day of surgery.  Place clean sheets on your bed the day you start using CHG soap. Use a clean washcloth (not used since being washed) for each shower. DO NOT sleep with pets once you start using the  CHG.   CHG Shower Instructions:  Wash your face and private area with normal soap. If you choose to wash your hair, wash first with your normal shampoo.  After you use shampoo/soap, rinse your hair and body thoroughly to remove shampoo/soap residue.  Turn the water  OFF and apply  bottle of CHG soap to a CLEAN washcloth.  Apply CHG soap ONLY FROM YOUR NECK DOWN TO YOUR TOES (washing for 3-5 minutes)  DO NOT use CHG soap on face, private areas, open wounds, or sores.  Pay special attention to the area where your surgery is being performed.  If you are having back surgery, having someone wash your back for you may be helpful. Wait 2 minutes after CHG soap is applied, then you may rinse off the CHG soap.  Pat dry with a clean towel  Put on clean clothes/pajamas   If you choose to wear lotion, please use ONLY the CHG-compatible lotions that are listed below.  Additional instructions for the day of surgery:  If you choose, you may shower the morning of surgery with an antibacterial soap.  DO NOT APPLY any lotions, deodorants, cologne, or perfumes.   Do not bring valuables to the hospital. [facility] is not  responsible for any belongings/valuables. Do not wear nail polish, gel polish, artificial nails, or any other type of covering on natural nails (fingers and toes) Do not wear jewelry or makeup Put on clean/comfortable clothes.  Please brush your teeth.  Ask your nurse before applying any prescription medications to the skin.     CHG Compatible Lotions   Aveeno Moisturizing lotion  Cetaphil Moisturizing Cream  Cetaphil Moisturizing Lotion  Clairol Herbal Essence Moisturizing Lotion, Dry Skin  Clairol Herbal Essence Moisturizing Lotion, Extra Dry Skin  Clairol Herbal Essence Moisturizing Lotion, Normal Skin  Curel Age Defying Therapeutic Moisturizing Lotion with Alpha Hydroxy  Curel Extreme Care Body Lotion  Curel Soothing Hands Moisturizing Hand Lotion  Curel Therapeutic Moisturizing Cream, Fragrance-Free  Curel Therapeutic Moisturizing Lotion, Fragrance-Free  Curel Therapeutic Moisturizing Lotion, Original Formula  Eucerin Daily Replenishing Lotion  Eucerin Dry Skin Therapy Plus Alpha Hydroxy Crme  Eucerin Dry Skin Therapy Plus Alpha Hydroxy Lotion  Eucerin Original Crme  Eucerin Original Lotion  Eucerin Plus Crme Eucerin Plus Lotion  Eucerin TriLipid Replenishing Lotion  Keri Anti-Bacterial Hand Lotion  Keri Deep Conditioning Original Lotion Dry Skin Formula Softly Scented  Keri Deep Conditioning Original Lotion, Fragrance Free Sensitive Skin Formula  Keri Lotion Fast Absorbing Fragrance Free Sensitive Skin Formula  Keri Lotion Fast Absorbing Softly Scented Dry Skin Formula  Keri Original Lotion  Keri Skin Renewal Lotion Keri Silky Smooth Lotion  Keri Silky Smooth Sensitive Skin Lotion  Nivea Body Creamy Conditioning Oil  Nivea Body Extra Enriched Lotion  Nivea Body Original Lotion  Nivea Body Sheer Moisturizing Lotion Nivea Crme  Nivea Skin Firming Lotion  NutraDerm 30 Skin Lotion  NutraDerm Skin Lotion  NutraDerm Therapeutic Skin Cream  NutraDerm Therapeutic Skin  Lotion  ProShield Protective Hand Cream  Provon moisturizing lotion                                  [facility]- Preparing for Total Shoulder Arthroplasty   Before surgery, you can play an important role. Because skin is not sterile, your skin needs to be as free of germs as possible. You can reduce the number of germs on your skin by using the  following products. Benzoyl Peroxide Gel Reduces the number of germs present on the skin Applied twice a day to shoulder area starting two days before surgery   Chlorhexidine  Gluconate (CHG) Soap An antiseptic cleaner that kills germs and bonds with the skin to continue killing germs even after washing Used for showering the night before surgery and morning of surgery   Oral Hygiene is also important to reduce your risk of infection.                                    Remember - BRUSH YOUR TEETH THE MORNING OF SURGERY WITH YOUR REGULAR TOOTHPASTE  ==================================================================  Please follow these instructions carefully:  BENZOYL PEROXIDE 5% GEL  Please do not use if you have an allergy to benzoyl peroxide.   If your skin becomes reddened/irritated stop using the benzoyl peroxide.  Starting two days before surgery, apply as follows: Apply benzoyl peroxide in the morning and at night. Apply after taking a shower. If you are not taking a shower clean entire shoulder front, back, and side along with the armpit with a clean wet washcloth.  Place a quarter-sized dollop on your shoulder and rub in thoroughly, making sure to cover the front, back, and side of your shoulder, along with the armpit.   2 days before ____ AM   ____ PM              1 day before ____ AM   ____ PM                             Do this twice a day for two days.  (Last application is the night before surgery, AFTER using the CHG soap as described below).  Do NOT apply benzoyl peroxide gel on the day of surgery.  CHLORHEXIDINE  GLUCONATE  (CHG) SOAP  Please do not use if you have an allergy to CHG or antibacterial soaps. If your skin becomes reddened/irritated stop using the CHG.   Do not shave (including legs and underarms) for at least 48 hours prior to first CHG shower. It is OK to shave your face.  Starting the night before surgery, use CHG soap as follows:  Shower the NIGHT BEFORE SURGERY and MORNING OF SURGERY with CHG.  If you choose to wash your hair, wash your hair first as usual with your normal shampoo.  After shampooing, rinse your hair and body thoroughly to remove the shampoo.  Use CHG as you would any other liquid soap.  You can apply CHG directly to the skin and wash gently with a scrungie or a clean washcloth.  Apply the CHG soap to your body ONLY FROM THE NECK DOWN.  Do not use on open wounds or open sores.  Avoid contact with your eyes, ears, mouth, and genitals (private parts).  Wash face and genitals (private parts) with your normal soap.  Wash thoroughly, paying special attention to the area where your surgery will be performed.  Thoroughly rinse your body with warm water  from the neck down.  DO NOT shower/wash with your normal soap after using and rinsing off the CHG soap.   Pat yourself dry with a CLEAN TOWEL.    Apply benzoyl peroxide.   Wear CLEAN PAJAMAS to bed the night before surgery; wear comfortable clothes the morning of surgery.  Place CLEAN SHEETS on your bed the  night of your first shower and DO NOT SLEEP WITH PETS.  Day of Surgery: Shower as above Do not apply any deodorants/lotions.  Please wear clean clothes to the hospital/surgery center.   Remember to brush your teeth WITH YOUR REGULAR TOOTHPASTE.   Please read over the following fact sheets that you were given.

## 2024-06-20 NOTE — Anesthesia Preprocedure Evaluation (Signed)
 "                                  Anesthesia Evaluation  Patient identified by MRN, date of birth, ID band Patient awake    Reviewed: Allergy & Precautions, NPO status , Patient's Chart, lab work & pertinent test results  Airway Mallampati: II  TM Distance: >3 FB Neck ROM: Full    Dental  (+) Missing, Dental Advisory Given   Pulmonary sleep apnea and Continuous Positive Airway Pressure Ventilation , former smoker   Pulmonary exam normal breath sounds clear to auscultation       Cardiovascular hypertension, Pt. on medications + angina  + CAD, + Past MI, + Cardiac Stents and + Peripheral Vascular Disease  Normal cardiovascular exam+ dysrhythmias (2nd degree AV block)  Rhythm:Regular Rate:Normal  TTE 2025 1. Left ventricular ejection fraction, by estimation, is 60 to 65%. The  left ventricle has normal function. The left ventricle has no regional  wall motion abnormalities. There is mild concentric left ventricular  hypertrophy. Left ventricular diastolic  parameters are consistent with Grade I diastolic dysfunction (impaired  relaxation).   2. Right ventricular systolic function is normal. The right ventricular  size is normal. There is moderately elevated pulmonary artery systolic  pressure. The estimated right ventricular systolic pressure is 45.5 mmHg.   3. Left atrial size was mild to moderately dilated.   4. Right atrial size was mildly dilated.   5. The mitral valve is normal in structure. Mild mitral valve  regurgitation. No evidence of mitral stenosis.   6. The aortic valve is normal in structure. There is mild calcification  of the aortic valve. Aortic valve regurgitation is trivial. Aortic valve  sclerosis/calcification is present, without any evidence of aortic  stenosis.   7. The inferior vena cava is dilated in size with <50% respiratory  variability, suggesting right atrial pressure of 15 mmHg.     Neuro/Psych  Headaches PSYCHIATRIC DISORDERS  Anxiety Depression       GI/Hepatic ,GERD  ,,(+)     substance abuse  , Hepatitis -, C  Endo/Other  diabetes, Type 2    Renal/GU negative Renal ROS  negative genitourinary   Musculoskeletal  (+) Arthritis ,  Fibromyalgia -, narcotic dependentS/p ACDF   Abdominal   Peds  Hematology  (+) Blood dyscrasia (prasugrel )   Anesthesia Other Findings 74 year old male with pertinent history including former smoker (quit 2012), CAD s/p multiple stents (DES mLAD 2008, DED LCX 2008 & 2009, BMS mRCA 2009; patent stents on [facility] 01/2013), non-insulin -dependent DM2, HTN, HLD, GERD on PPI and H2 blocker, COPD, hepatitis C (questionable per notes, diagnosed in 1979, possibly hepatitis A), OSA not on CPAP, chronic prednisone  use, depression/anxiety, right BKA, chronic right knee ulcer, chronic pain syndrome, s/p sleeve gastrectomy 2020, s/p C3-4 ACDF 2015  Reproductive/Obstetrics                              Anesthesia Physical Anesthesia Plan  ASA: 3  Anesthesia Plan: General and Regional   Post-op Pain Management: Regional block* and Tylenol  PO (pre-op)*   Induction: Intravenous  PONV Risk Score and Plan: 2 and Dexamethasone , Ondansetron  and Treatment may vary due to age or medical condition  Airway Management Planned: Oral ETT  Additional Equipment:   Intra-op Plan:   Post-operative Plan: Extubation in OR  Informed Consent: I have  reviewed the patients History and Physical, chart, labs and discussed the procedure including the risks, benefits and alternatives for the proposed anesthesia with the patient or authorized representative who has indicated his/her understanding and acceptance.     Dental advisory given  Plan Discussed with: CRNA  Anesthesia Plan Comments: (Stress dose steroids)         Anesthesia Quick Evaluation  "

## 2024-06-20 NOTE — Progress Notes (Signed)
 Herlene Calix, PA-C made aware of +MRSA and +MSSA on surgical PCR

## 2024-06-20 NOTE — Progress Notes (Signed)
 Anesthesia Chart Review:  74 year old male with pertinent history including former smoker (quit 2012), CAD s/p multiple stents (DES mLAD 2008, DED LCX 2008 & 2009, BMS mRCA 2009; patent stents on [facility] 01/2013), non-insulin -dependent DM2, HTN, HLD, GERD on PPI and H2 blocker, COPD, hepatitis C (questionable per notes, diagnosed in 1979, possibly hepatitis A), OSA not on CPAP, chronic prednisone  use, depression/anxiety, right BKA, chronic right knee ulcer, chronic pain syndrome, s/p sleeve gastrectomy 2020, s/p C3-4 ACDF 2015.   He had left reverse total shoulder on 03/17/21 and had a post-operative cardiology consult for intraoperative hypotension and bradycardia. By notes, an a-line had to be placed about 30 minutes into the case because ClearSight and NIBP were not correlating. Arterial hypotension noted with bradycardia/junctional bradycardia (known to have at least intermittent baseline bradycardia as 07/26/20 EKG showed SB at 42 bpm, first degree AVB) with rates predominantly 40-50's. He was given ephedrine , calcium  chloride, vasopressin  and atropine . Hypotension resolved post-operative. He was on telemetry post-operatively and noted to have intermittent bradycardia with findings of Mobitz 1 2nd degree AVB and brief 2:1 AVB but asymptomatic and not felt to require a PPM. Avoid AVN blocking agents recommended. Echo 03/18/2021 showed LVEF 65 to 70%, normal RV, no significant valvular abnormalities.  Prior nuclear stress test 03/2018 was negative for ischemia.  Last seen in cardiology follow-up by Thom Sluder, PA-C on 06/03/2024 for follow-up and preop evaluation.  Per note, Preop shoulder arthroplasty Already addressed on telephone visit but OK to hold Effient  for 5 days as requested. Regarding ASA therapy, we recommend continuation of ASA throughout the perioperative period.  However, if the surgeon feels that cessation of ASA is required in the perioperative period, it may be stopped 5-7 days prior to surgery  with a plan to resume it as soon as felt to be feasible from a surgical standpoint in the post-operative period.    Recent admission 12/25 through 05/18/2024 for septic shock secondary to UTI.  Treated with IV fluid, IV antibiotic, and IV pressors.  Echo during admission showed LVEF 60 to 65%, normal wall motion, grade 1 DD, normal RV, moderately elevated PASP with RVSP 45.5 mmHg, mild mitral regurgitation.   Patient reports last dose of Mounjaro  06/09/2024.  Instructed to hold 7 days prior to surgery.  Reports last dose of Effient  06/12/2024.  Preop labs reviewed, mild anemia hemoglobin 12.4, otherwise unremarkable.  DM2 well-controlled with A1c 5.2.   EKG 05/15/2024: Sinus rhythm.  Rate 65.  Ventricular premature complex.  Nonspecific intraventricular conduction delay.   TTE 05/15/2024:  1. Left ventricular ejection fraction, by estimation, is 60 to 65%. The  left ventricle has normal function. The left ventricle has no regional  wall motion abnormalities. There is mild concentric left ventricular  hypertrophy. Left ventricular diastolic  parameters are consistent with Grade I diastolic dysfunction (impaired  relaxation).   2. Right ventricular systolic function is normal. The right ventricular  size is normal. There is moderately elevated pulmonary artery systolic  pressure. The estimated right ventricular systolic pressure is 45.5 mmHg.   3. Left atrial size was mild to moderately dilated.   4. Right atrial size was mildly dilated.   5. The mitral valve is normal in structure. Mild mitral valve  regurgitation. No evidence of mitral stenosis.   6. The aortic valve is normal in structure. There is mild calcification  of the aortic valve. Aortic valve regurgitation is trivial. Aortic valve  sclerosis/calcification is present, without any evidence of aortic  stenosis.  7. The inferior vena cava is dilated in size with <50% respiratory  variability, suggesting right atrial pressure of  15 mmHg.    Nuclear stress 03/26/2018: Nuclear stress EF: 47%. There was no ST segment deviation noted during stress. No T wave inversion was noted during stress. This is an intermediate risk study due to reduced systolic function. There is no ischemia. The left ventricular ejection fraction is mildly decreased (45-54%).    Lynwood Geofm RIGGERS [facility] Short Stay Center/Anesthesiology Phone [phone] 06/20/2024 2:02 PM

## 2024-06-23 LAB — SURGICAL PATHOLOGY

## 2024-06-24 ENCOUNTER — Ambulatory Visit (HOSPITAL_COMMUNITY): Admission: RE | Admit: 2024-06-24 | Admitting: Orthopedic Surgery

## 2024-06-24 ENCOUNTER — Encounter (HOSPITAL_COMMUNITY): Payer: Self-pay | Admitting: Certified Registered"

## 2024-06-24 ENCOUNTER — Encounter (HOSPITAL_COMMUNITY): Payer: Self-pay | Admitting: Physician Assistant

## 2024-06-24 ENCOUNTER — Encounter (HOSPITAL_COMMUNITY): Payer: Self-pay | Admitting: Orthopedic Surgery

## 2024-06-24 ENCOUNTER — Observation Stay (HOSPITAL_COMMUNITY)

## 2024-06-24 ENCOUNTER — Telehealth: Payer: Self-pay

## 2024-06-24 ENCOUNTER — Encounter (HOSPITAL_COMMUNITY): Admission: RE | Disposition: A | Payer: Self-pay | Source: Home / Self Care | Attending: Orthopedic Surgery

## 2024-06-24 DIAGNOSIS — Z01818 Encounter for other preprocedural examination: Principal | ICD-10-CM

## 2024-06-24 DIAGNOSIS — Z96611 Presence of right artificial shoulder joint: Secondary | ICD-10-CM

## 2024-06-24 LAB — GLUCOSE, CAPILLARY
Glucose-Capillary: 92 mg/dL (ref 70–99)
Glucose-Capillary: 109 mg/dL — ABNORMAL HIGH (ref 70–99)
Glucose-Capillary: 116 mg/dL — ABNORMAL HIGH (ref 70–99)

## 2024-06-24 MED ORDER — METHOCARBAMOL 1000 MG/10ML IJ SOLN: 500.0000 mg | Freq: Four times a day (QID) | INTRAMUSCULAR | Status: DC | PRN

## 2024-06-24 MED ORDER — ACETAMINOPHEN 500 MG PO TABS
1000.0000 mg | ORAL_TABLET | Freq: Once | ORAL | Status: AC
  Administered 2024-06-24: 1000 mg via ORAL
  Filled 2024-06-24: qty 2

## 2024-06-24 MED ORDER — ZOLPIDEM TARTRATE 5 MG PO TABS: 10.0000 mg | ORAL_TABLET | Freq: Every evening | ORAL | Status: DC | PRN

## 2024-06-24 MED ORDER — CHLORHEXIDINE GLUCONATE 0.12 % MT SOLN
15.0000 mL | Freq: Once | OROMUCOSAL | Status: AC
  Administered 2024-06-24: 15 mL via OROMUCOSAL
  Filled 2024-06-24: qty 15

## 2024-06-24 MED ORDER — OXYCODONE HCL 5 MG PO TABS
10.0000 mg | ORAL_TABLET | ORAL | Status: DC | PRN
  Administered 2024-06-24 – 2024-06-25 (×3): 10 mg via ORAL
  Filled 2024-06-24 (×3): qty 2

## 2024-06-24 MED ORDER — DULOXETINE HCL 30 MG PO CPEP: 30.0000 mg | ORAL_CAPSULE | Freq: Every day | ORAL | Status: DC

## 2024-06-24 MED ORDER — OXYCODONE HCL 5 MG/5ML PO SOLN: 5.0000 mg | Freq: Once | ORAL | Status: DC | PRN

## 2024-06-24 MED ORDER — PHENYLEPHRINE HCL-NACL 20-0.9 MG/250ML-% IV SOLN
INTRAVENOUS | Status: DC | PRN
  Administered 2024-06-24: 10 ug/min via INTRAVENOUS
  Administered 2024-06-24: 25 ug/min via INTRAVENOUS

## 2024-06-24 MED ORDER — IRRISEPT - 450ML BOTTLE WITH 0.05% CHG IN STERILE WATER, USP 99.95% OPTIME
TOPICAL | Status: DC | PRN
  Administered 2024-06-24: 450 mL via TOPICAL

## 2024-06-24 MED ORDER — LACTATED RINGERS IV SOLN: INTRAVENOUS | Status: DC

## 2024-06-24 MED ORDER — TRAZODONE HCL 50 MG PO TABS
150.0000 mg | ORAL_TABLET | Freq: Every day | ORAL | Status: DC
  Administered 2024-06-24: 150 mg via ORAL
  Filled 2024-06-24: qty 3

## 2024-06-24 MED ORDER — PROPOFOL 10 MG/ML IV BOLUS
INTRAVENOUS | Status: DC | PRN
  Administered 2024-06-24: 150 mg via INTRAVENOUS

## 2024-06-24 MED ORDER — FENTANYL CITRATE (PF) 250 MCG/5ML IJ SOLN
INTRAMUSCULAR | Status: AC
  Filled 2024-06-24: qty 5

## 2024-06-24 MED ORDER — VANCOMYCIN HCL IN DEXTROSE 1-5 GM/200ML-% IV SOLN
1000.0000 mg | INTRAVENOUS | Status: AC
  Administered 2024-06-24: 1000 mg via INTRAVENOUS
  Filled 2024-06-24: qty 200

## 2024-06-24 MED ORDER — DULOXETINE HCL 60 MG PO CPEP
60.0000 mg | ORAL_CAPSULE | Freq: Two times a day (BID) | ORAL | Status: DC
  Administered 2024-06-24: 60 mg via ORAL
  Filled 2024-06-24: qty 1

## 2024-06-24 MED ORDER — EMTRICITABINE-TENOFOVIR AF 200-25 MG PO TABS
1.0000 | ORAL_TABLET | Freq: Every day | ORAL | Status: DC
  Administered 2024-06-24: 1 via ORAL
  Filled 2024-06-24 (×2): qty 1

## 2024-06-24 MED ORDER — SENNA 8.6 MG PO TABS
1.0000 | ORAL_TABLET | Freq: Every day | ORAL | Status: DC
  Administered 2024-06-24 – 2024-06-25 (×2): 8.6 mg via ORAL
  Filled 2024-06-24 (×2): qty 1

## 2024-06-24 MED ORDER — PANTOPRAZOLE SODIUM 40 MG PO TBEC
40.0000 mg | DELAYED_RELEASE_TABLET | Freq: Two times a day (BID) | ORAL | Status: DC
  Administered 2024-06-24 – 2024-06-25 (×2): 40 mg via ORAL
  Filled 2024-06-24 (×3): qty 1

## 2024-06-24 MED ORDER — BUPIVACAINE LIPOSOME 1.3 % IJ SUSP
INTRAMUSCULAR | Status: DC | PRN
  Administered 2024-06-24: 10 mL via PERINEURAL

## 2024-06-24 MED ORDER — DULOXETINE HCL 60 MG PO CPEP: 60.0000 mg | ORAL_CAPSULE | Freq: Every day | ORAL | Status: DC

## 2024-06-24 MED ORDER — PRASUGREL HCL 5 MG PO TABS
5.0000 mg | ORAL_TABLET | Freq: Every day | ORAL | Status: DC
  Filled 2024-06-24: qty 1

## 2024-06-24 MED ORDER — 0.9 % SODIUM CHLORIDE (POUR BTL) OPTIME
TOPICAL | Status: DC | PRN
  Administered 2024-06-24 (×4): 1000 mL

## 2024-06-24 MED ORDER — MIDAZOLAM HCL (PF) 2 MG/2ML IJ SOLN: 2.0000 mg | Freq: Once | INTRAMUSCULAR | Status: AC

## 2024-06-24 MED ORDER — FENTANYL CITRATE (PF) 250 MCG/5ML IJ SOLN
INTRAMUSCULAR | Status: DC | PRN
  Administered 2024-06-24 (×2): 50 ug via INTRAVENOUS

## 2024-06-24 MED ORDER — LIDOCAINE 2% (20 MG/ML) 5 ML SYRINGE
INTRAMUSCULAR | Status: AC
  Filled 2024-06-24: qty 5

## 2024-06-24 MED ORDER — ORAL CARE MOUTH RINSE: 15.0000 mL | Freq: Once | OROMUCOSAL | Status: AC

## 2024-06-24 MED ORDER — PHENOL 1.4 % MT LIQD: 1.0000 | OROMUCOSAL | Status: DC | PRN

## 2024-06-24 MED ORDER — INSULIN ASPART 100 UNIT/ML IJ SOLN: 0.0000 [IU] | INTRAMUSCULAR | Status: DC | PRN

## 2024-06-24 MED ORDER — SODIUM CHLORIDE 0.9 % IV SOLN: Freq: Once | INTRAVENOUS | Status: AC

## 2024-06-24 MED ORDER — DULOXETINE HCL 60 MG PO CPEP
90.0000 mg | ORAL_CAPSULE | Freq: Every day | ORAL | Status: DC
  Administered 2024-06-25: 90 mg via ORAL
  Filled 2024-06-24 (×2): qty 1

## 2024-06-24 MED ORDER — CEFAZOLIN SODIUM-DEXTROSE 2-4 GM/100ML-% IV SOLN
2.0000 g | Freq: Three times a day (TID) | INTRAVENOUS | Status: DC
  Administered 2024-06-24 – 2024-06-25 (×2): 2 g via INTRAVENOUS
  Filled 2024-06-24 (×2): qty 100

## 2024-06-24 MED ORDER — MENTHOL 3 MG MT LOZG: 1.0000 | LOZENGE | OROMUCOSAL | Status: DC | PRN

## 2024-06-24 MED ORDER — OXYCODONE HCL 5 MG PO TABS: 5.0000 mg | ORAL_TABLET | Freq: Once | ORAL | Status: DC | PRN

## 2024-06-24 MED ORDER — ACETAMINOPHEN 325 MG PO TABS
325.0000 mg | ORAL_TABLET | Freq: Four times a day (QID) | ORAL | Status: DC | PRN
  Administered 2024-06-25: 650 mg via ORAL
  Filled 2024-06-24: qty 2

## 2024-06-24 MED ORDER — BUPIVACAINE HCL (PF) 0.5 % IJ SOLN
INTRAMUSCULAR | Status: DC | PRN
  Administered 2024-06-24: 15 mL via PERINEURAL

## 2024-06-24 MED ORDER — GABAPENTIN 400 MG PO CAPS
800.0000 mg | ORAL_CAPSULE | Freq: Three times a day (TID) | ORAL | Status: DC
  Administered 2024-06-24 – 2024-06-25 (×2): 800 mg via ORAL
  Filled 2024-06-24 (×3): qty 2

## 2024-06-24 MED ORDER — PANCRELIPASE (LIP-PROT-AMYL) 40000-126000 UNITS PO CPEP: 120000.0000 [IU] | ORAL_CAPSULE | ORAL | Status: DC

## 2024-06-24 MED ORDER — EPHEDRINE 5 MG/ML INJ
INTRAVENOUS | Status: AC
  Filled 2024-06-24: qty 5

## 2024-06-24 MED ORDER — METOCLOPRAMIDE HCL 5 MG/ML IJ SOLN: 5.0000 mg | Freq: Three times a day (TID) | INTRAMUSCULAR | Status: DC | PRN

## 2024-06-24 MED ORDER — NALOXONE HCL 4 MG/0.1ML NA LIQD: 1.0000 | Freq: Every day | NASAL | Status: DC | PRN

## 2024-06-24 MED ORDER — CEFAZOLIN SODIUM-DEXTROSE 2-4 GM/100ML-% IV SOLN
2.0000 g | INTRAVENOUS | Status: AC
  Administered 2024-06-24: 2 g via INTRAVENOUS
  Filled 2024-06-24: qty 100

## 2024-06-24 MED ORDER — ACETAMINOPHEN 500 MG PO TABS
1000.0000 mg | ORAL_TABLET | Freq: Four times a day (QID) | ORAL | Status: DC
  Administered 2024-06-24 – 2024-06-25 (×3): 1000 mg via ORAL
  Filled 2024-06-24 (×3): qty 2

## 2024-06-24 MED ORDER — POVIDONE-IODINE 10 % EX SWAB
2.0000 | Freq: Once | CUTANEOUS | Status: AC
  Administered 2024-06-24: 2 via TOPICAL

## 2024-06-24 MED ORDER — VANCOMYCIN HCL 1000 MG IV SOLR
INTRAVENOUS | Status: AC
  Filled 2024-06-24: qty 20

## 2024-06-24 MED ORDER — MIDAZOLAM HCL 2 MG/2ML IJ SOLN
INTRAMUSCULAR | Status: AC
  Administered 2024-06-24: 2 mg via INTRAVENOUS
  Filled 2024-06-24: qty 2

## 2024-06-24 MED ORDER — ROCURONIUM BROMIDE 10 MG/ML (PF) SYRINGE
PREFILLED_SYRINGE | INTRAVENOUS | Status: AC
  Filled 2024-06-24: qty 10

## 2024-06-24 MED ORDER — HYDROMORPHONE HCL 1 MG/ML IJ SOLN: 0.5000 mg | INTRAMUSCULAR | Status: DC | PRN

## 2024-06-24 MED ORDER — METHOCARBAMOL 500 MG PO TABS
500.0000 mg | ORAL_TABLET | Freq: Four times a day (QID) | ORAL | Status: DC | PRN
  Administered 2024-06-24 – 2024-06-25 (×2): 500 mg via ORAL
  Filled 2024-06-24 (×2): qty 1

## 2024-06-24 MED ORDER — PANCRELIPASE (LIP-PROT-AMYL) 36000-114000 UNITS PO CPEP
120000.0000 [IU] | ORAL_CAPSULE | Freq: Three times a day (TID) | ORAL | Status: DC
  Administered 2024-06-25: 120000 [IU] via ORAL
  Filled 2024-06-24 (×2): qty 1

## 2024-06-24 MED ORDER — PROPOFOL 10 MG/ML IV BOLUS
INTRAVENOUS | Status: AC
  Filled 2024-06-24: qty 20

## 2024-06-24 MED ORDER — ROCURONIUM BROMIDE 10 MG/ML (PF) SYRINGE
PREFILLED_SYRINGE | INTRAVENOUS | Status: DC | PRN
  Administered 2024-06-24: 20 mg via INTRAVENOUS
  Administered 2024-06-24: 70 mg via INTRAVENOUS

## 2024-06-24 MED ORDER — ASPIRIN 81 MG PO TBEC
81.0000 mg | DELAYED_RELEASE_TABLET | Freq: Every day | ORAL | Status: DC
  Administered 2024-06-25: 81 mg via ORAL
  Filled 2024-06-24: qty 1

## 2024-06-24 MED ORDER — FAMOTIDINE 20 MG PO TABS
40.0000 mg | ORAL_TABLET | Freq: Every day | ORAL | Status: DC
  Administered 2024-06-24: 40 mg via ORAL
  Filled 2024-06-24: qty 2

## 2024-06-24 MED ORDER — LIDOCAINE 2% (20 MG/ML) 5 ML SYRINGE
INTRAMUSCULAR | Status: DC | PRN
  Administered 2024-06-24: 40 mg via INTRAVENOUS

## 2024-06-24 MED ORDER — CELECOXIB 200 MG PO CAPS: 200.0000 mg | ORAL_CAPSULE | Freq: Two times a day (BID) | ORAL | Status: DC

## 2024-06-24 MED ORDER — VANCOMYCIN HCL 1000 MG IV SOLR
INTRAVENOUS | Status: DC | PRN
  Administered 2024-06-24: 1000 mg via TOPICAL

## 2024-06-24 MED ORDER — ARIPIPRAZOLE 5 MG PO TABS
15.0000 mg | ORAL_TABLET | Freq: Every day | ORAL | Status: DC
  Administered 2024-06-24: 15 mg via ORAL
  Filled 2024-06-24 (×2): qty 1

## 2024-06-24 MED ORDER — ONDANSETRON HCL 4 MG PO TABS: 4.0000 mg | ORAL_TABLET | Freq: Four times a day (QID) | ORAL | Status: DC | PRN

## 2024-06-24 MED ORDER — ONDANSETRON HCL 4 MG/2ML IJ SOLN
INTRAMUSCULAR | Status: DC | PRN
  Administered 2024-06-24: 4 mg via INTRAVENOUS

## 2024-06-24 MED ORDER — POVIDONE-IODINE 7.5 % EX SOLN
Freq: Once | CUTANEOUS | Status: AC
  Filled 2024-06-24: qty 118

## 2024-06-24 MED ORDER — EPHEDRINE SULFATE-NACL 50-0.9 MG/10ML-% IV SOSY
PREFILLED_SYRINGE | INTRAVENOUS | Status: DC | PRN
  Administered 2024-06-24: 5 mg via INTRAVENOUS
  Administered 2024-06-24: 10 mg via INTRAVENOUS
  Administered 2024-06-24 (×2): 5 mg via INTRAVENOUS

## 2024-06-24 MED ORDER — FENTANYL CITRATE (PF) 100 MCG/2ML IJ SOLN
INTRAMUSCULAR | Status: AC
  Administered 2024-06-24: 100 ug via INTRAVENOUS
  Filled 2024-06-24: qty 2

## 2024-06-24 MED ORDER — CLONAZEPAM 0.5 MG PO TABS: 0.5000 mg | ORAL_TABLET | Freq: Every day | ORAL | Status: DC | PRN

## 2024-06-24 MED ORDER — TAMSULOSIN HCL 0.4 MG PO CAPS
0.4000 mg | ORAL_CAPSULE | Freq: Two times a day (BID) | ORAL | Status: DC
  Administered 2024-06-24 – 2024-06-25 (×2): 0.4 mg via ORAL
  Filled 2024-06-24 (×2): qty 1

## 2024-06-24 MED ORDER — FENTANYL CITRATE (PF) 100 MCG/2ML IJ SOLN: 25.0000 ug | INTRAMUSCULAR | Status: DC | PRN

## 2024-06-24 MED ORDER — PANCRELIPASE (LIP-PROT-AMYL) 36000-114000 UNITS PO CPEP: 72000.0000 [IU] | ORAL_CAPSULE | Freq: Two times a day (BID) | ORAL | Status: DC | PRN

## 2024-06-24 MED ORDER — FUROSEMIDE 20 MG PO TABS
20.0000 mg | ORAL_TABLET | ORAL | Status: DC
  Administered 2024-06-25: 20 mg via ORAL
  Filled 2024-06-24: qty 1

## 2024-06-24 MED ORDER — SUGAMMADEX SODIUM 200 MG/2ML IV SOLN
INTRAVENOUS | Status: DC | PRN
  Administered 2024-06-24: 200 mg via INTRAVENOUS

## 2024-06-24 MED ORDER — PREDNISONE 20 MG PO TABS
20.0000 mg | ORAL_TABLET | Freq: Every day | ORAL | Status: DC
  Administered 2024-06-25: 20 mg via ORAL
  Filled 2024-06-24: qty 1

## 2024-06-24 MED ORDER — PHENYLEPHRINE 80 MCG/ML (10ML) SYRINGE FOR IV PUSH (FOR BLOOD PRESSURE SUPPORT)
PREFILLED_SYRINGE | INTRAVENOUS | Status: DC | PRN
  Administered 2024-06-24: 80 ug via INTRAVENOUS

## 2024-06-24 MED ORDER — FENTANYL CITRATE (PF) 100 MCG/2ML IJ SOLN: 100.0000 ug | Freq: Once | INTRAMUSCULAR | Status: AC

## 2024-06-24 MED ORDER — METOCLOPRAMIDE HCL 5 MG PO TABS: 5.0000 mg | ORAL_TABLET | Freq: Three times a day (TID) | ORAL | Status: DC | PRN

## 2024-06-24 MED ORDER — AMISULPRIDE (ANTIEMETIC) 5 MG/2ML IV SOLN: 10.0000 mg | Freq: Once | INTRAVENOUS | Status: DC | PRN

## 2024-06-24 MED ORDER — DONEPEZIL HCL 10 MG PO TABS
10.0000 mg | ORAL_TABLET | Freq: Every morning | ORAL | Status: DC
  Administered 2024-06-25: 10 mg via ORAL
  Filled 2024-06-24: qty 1

## 2024-06-24 NOTE — Transfer of Care (Signed)
 Immediate Anesthesia Transfer of Care Note  Patient: Travis [name].  Procedure(s) Performed: RIGHT REVERSE TOTAL SHOULDER ARTHROPLASTY (Right: Shoulder)  Patient Location: PACU  Anesthesia Type:General and Regional  Level of Consciousness: awake, alert , oriented, and patient cooperative  Airway & Oxygen  Therapy: Patient Spontanous Breathing  Post-op Assessment: Report given to RN and Post -op Vital signs reviewed and stable  Post vital signs: Reviewed and stable  Last Vitals:  Vitals Value Taken Time  BP 128/59 06/24/24 15:00  Temp    Pulse 69 06/24/24 15:03  Resp 17 06/24/24 15:03  SpO2 100 % 06/24/24 15:03  Vitals shown include unfiled device data.  Last Pain:  Vitals:   06/24/24 1110  TempSrc:   PainSc: 0-No pain         Complications: No notable events documented.

## 2024-06-24 NOTE — Anesthesia Postprocedure Evaluation (Signed)
"   Anesthesia Post Note  Patient: Travis BROMWELL Sr.  Procedure(s) Performed: RIGHT REVERSE TOTAL SHOULDER ARTHROPLASTY (Right: Shoulder)     Patient location during evaluation: PACU Anesthesia Type: Regional and General Level of consciousness: awake and alert Pain management: pain level controlled Vital Signs Assessment: post-procedure vital signs reviewed and stable Respiratory status: spontaneous breathing, nonlabored ventilation, respiratory function stable and patient connected to nasal cannula oxygen  Cardiovascular status: blood pressure returned to baseline and stable Postop Assessment: no apparent nausea or vomiting Anesthetic complications: no   No notable events documented.  Last Vitals:  Vitals:   06/24/24 1500 06/24/24 1515  BP: (!) 128/59 (!) 131/59  Pulse: 67 64  Resp: 16 15  Temp:    SpO2: 100% 100%    Last Pain:  Vitals:   06/24/24 1457  TempSrc:   PainSc: 0-No pain   Pain Goal:                   [name]      "

## 2024-06-24 NOTE — Anesthesia Procedure Notes (Signed)
 Anesthesia Regional Block: Interscalene brachial plexus block   Pre-Anesthetic Checklist: , timeout performed,  Correct Patient, Correct Site, Correct Laterality,  Correct Procedure, Correct Position, site marked,  Risks and benefits discussed,  Pre-op evaluation,  At surgeon's request and post-op pain management  Laterality: Right  Prep: Maximum Sterile Barrier Precautions used, chloraprep       Needles:  Injection technique: Single-shot  Needle Type: Echogenic Stimulator Needle     Needle Length: 4cm  Needle Gauge: 21     Additional Needles:   Procedures:,,,, ultrasound used (permanent image in chart),,    Narrative:  Start time: 06/24/2024 10:55 AM End time: 06/24/2024 10:58 AM Injection made incrementally with aspirations every 5 mL. Anesthesiologist: Niels Marien CROME, MD

## 2024-06-24 NOTE — Telephone Encounter (Signed)
 [facility] patient advocate calling for patient with questions and concerns about the CPM and it's cost Rosina [phone]

## 2024-06-24 NOTE — Brief Op Note (Signed)
" ° °  06/24/2024  2:57 PM  PATIENT:  Travis FORBES [place] Sr.  74 y.o. male  PRE-OPERATIVE DIAGNOSIS:  osteoarthritis right shoulder  POST-OPERATIVE DIAGNOSIS:  osteoarthritis right shoulder  PROCEDURE:  Procedures: RIGHT REVERSE TOTAL SHOULDER ARTHROPLASTY  SURGEON:  Surgeon(s): Addie Cordella Hamilton, MD  ASSISTANT: Magnant PA  ANESTHESIA: General  EBL: 125 ml    No intake/output data recorded.  BLOOD ADMINISTERED: none  DRAINS: None  LOCAL MEDICATIONS USED: Vancomycin   SPECIMEN:  No Specimen  COUNTS:  YES  TOURNIQUET:  * No tourniquets in log *  DICTATION: .Other Dictation: Dictation Number [id number]  PLAN OF CARE: Admit for overnight observation  PATIENT DISPOSITION:  PACU - hemodynamically stable              "

## 2024-06-24 NOTE — Anesthesia Procedure Notes (Signed)
 Procedure Name: Intubation Date/Time: 06/24/2024 11:44 AM  Performed by: Marva Lonni PARAS, CRNAPre-anesthesia Checklist: Patient identified, Emergency Drugs available, Suction available and Patient being monitored Patient Re-evaluated:Patient Re-evaluated prior to induction Oxygen  Delivery Method: Circle System Utilized Preoxygenation: Pre-oxygenation with 100% oxygen  Induction Type: IV induction Ventilation: Mask ventilation without difficulty Laryngoscope Size: Glidescope and 3 Grade View: Grade I Tube type: Oral Tube size: 7.5 mm Number of attempts: 1 Airway Equipment and Method: Rigid stylet and Video-laryngoscopy Placement Confirmation: ETT inserted through vocal cords under direct vision, positive ETCO2 and breath sounds checked- equal and bilateral Secured at: 23 cm Tube secured with: Tape Dental Injury: Teeth and Oropharynx as per pre-operative assessment  Comments: Glidescope used d/t limited neck movement from previous ACDF surgery -- Grade I view obtained with Glidescope S4 blade

## 2024-06-25 MED ORDER — OXYCODONE HCL 10 MG PO TABS: 10.0000 mg | ORAL_TABLET | ORAL | 0 refills | Status: AC | PRN

## 2024-06-25 MED ORDER — CHLORHEXIDINE GLUCONATE 4 % EX SOLN: CUTANEOUS | 1 refills | Status: AC

## 2024-06-25 MED ORDER — SENNA 8.6 MG PO TABS: 1.0000 | ORAL_TABLET | Freq: Every day | ORAL | 0 refills | Status: AC

## 2024-06-25 MED ORDER — MUPIROCIN 2 % EX OINT: 1.0000 | TOPICAL_OINTMENT | Freq: Two times a day (BID) | CUTANEOUS | 0 refills | Status: AC

## 2024-06-25 NOTE — Progress Notes (Signed)
" °  Subjective: Travis [name]. is a 74 y.o. male s/p right RSA.  They are POD 1.  Pt's pain is controlled, block still in effect but wearing off.  Patient denies any complaints of chest pain, shortness of breath, abdominal pain.  Eating breakfast comfortably in bed.  Objective: Vital signs in last 24 hours: Temp:  [97.7 F (36.5 C)-98.4 F (36.9 C)] 98.4 F (36.9 C) (02/04 0733) Pulse Rate:  [55-72] 61 (02/04 0733) Resp:  [12-20] 17 (02/04 0733) BP: (112-147)/(53-69) 124/56 (02/04 0733) SpO2:  [95 %-100 %] 100 % (02/04 0733) Weight:  [98.6 kg] 98.6 kg (02/03 0855)  Intake/Output from previous day: 02/03 0701 - 02/04 0700 In: 1930 [P.O.:480; I.V.:1450] Out: 1300 [Urine:1200; Blood:100] Intake/Output this shift: No intake/output data recorded.  Exam:  No gross blood or drainage overlying the dressing.  There is some mild spotting of blood at the superior aspect of the dressing.  Plan to outline this with a marker and he will keep an eye on this at home; he may return to the office if this expands and we can change dressing 2+ radial pulse of the operative extremity Postoperative physical exam somewhat limited by interscalene block but intact EPL, FPL, finger abduction, elbow flexion   Labs: No results for input(s): HGB in the last 72 hours. No results for input(s): WBC, RBC, HCT, PLT in the last 72 hours. No results for input(s): NA, K, CL, CO2, BUN, CREATININE, GLUCOSE, CALCIUM  in the last 72 hours. No results for input(s): LABPT, INR in the last 72 hours.  Assessment/Plan: Pt is POD 1 s/p right RSA    -Plan to discharge to home today after working with occupational therapy  -Okay to weight-bear with walker using both arms or weight-bear with his cane in his left arm  -No lifting with the operative arm  -Follow-up with Dr. Addie in clinic 2 weeks postoperatively     [facility] 06/25/2024, 7:40 AM       "

## 2024-06-25 NOTE — Evaluation (Signed)
 Occupational Therapy Evaluation Patient Details Name: Travis [name] Sr. MRN: [id number] DOB: 12-Dec-1950 Today's Date: 06/25/2024   History of Present Illness   Patient is a 74 yo male presenting to the hospital for  R reverse total shoulder on 06/24/24. PMH of RLE abscess, L reverse TSA on 03/07/21, R BKA, R ankle fusion, scolsis, S/P lubar fusion,  Bil TKA, Bil THA, DM, HTN, CAD, cardiac stents, PVD, angina, anxiety, depression, GERD, substance abuse, hepatits C,  and fibromyalgia.     Clinical Impressions Prior to this admission, patient living with his girlfriend, does not drive, and uses a cane for ambulation. Patient has progressed in his indpendence with his ADLs but does have assist when needed. Currently, patient with increased R shoulder pain, and requiring significant assist for bed mobility (will be sleeping in a recliner at home) and mod A for ADLs. Patient with excellent recall of shoulder precautions from previous shoulder surgery, but requiring more assist due to his prosthetic leg and completing lower body dressing with one hand. OT recommending HHOT at discharge, OT will continue to follow acutely.     If plan is discharge home, recommend the following:   A lot of help with walking and/or transfers;A lot of help with bathing/dressing/bathroom;Assist for transportation;Assistance with cooking/housework     Functional Status Assessment   Patient has had a recent decline in their functional status and demonstrates the ability to make significant improvements in function in a reasonable and predictable amount of time.     Equipment Recommendations   None recommended by OT (patient has DME needed)     Recommendations for Other Services         Precautions/Restrictions   Precautions Precautions: Sternal Precaution Booklet Issued: Yes (comment) Recall of Precautions/Restrictions: Intact Precaution/Restrictions Comments: AROM to elbow, wrist, and hand  permitted, sling at all times except for ADLs and exercies, ER 0 to 30, pendulums permitted Restrictions Weight Bearing Restrictions Per Provider Order: Yes RUE Weight Bearing Per Provider Order: Non weight bearing     Mobility Bed Mobility Overal bed mobility: Needs Assistance Bed Mobility: Supine to Sit     Supine to sit: Max assist     General bed mobility comments: heavy max A to ensure patient did not use RUE, will be sleeping in a recliner at home    Transfers Overall transfer level: Needs assistance Equipment used: 1 person hand held assist, Rolling walker (2 wheels) Transfers: Sit to/from Stand Sit to Stand: Min assist, Mod assist           General transfer comment: RW present for increased stability (RUE in sling) however decreased ability to come into standing requiring elevated surface and min-mod A to stand      Balance Overall balance assessment: Needs assistance Sitting-balance support: Single extremity supported, Feet supported Sitting balance-Leahy Scale: Fair Sitting balance - Comments: did not challenge   Standing balance support: Single extremity supported, During functional activity, Reliant on assistive device for balance Standing balance-Leahy Scale: Poor Standing balance comment: reliant on external assist                           ADL either performed or assessed with clinical judgement   ADL Overall ADL's : Needs assistance/impaired Eating/Feeding: Set up;Sitting   Grooming: Set up;Sitting   Upper Body Bathing: Moderate assistance;Sitting   Lower Body Bathing: Maximal assistance;Sitting/lateral leans;Sit to/from stand   Upper Body Dressing : Moderate assistance;Sitting   Lower Body  Dressing: Maximal assistance;Sit to/from stand;Sitting/lateral leans   Toilet Transfer: Minimal assistance;Moderate assistance;Ambulation Toilet Transfer Details (indicate cue type and reason): simulated Toileting- Clothing Manipulation and  Hygiene: Minimal assistance;Sitting/lateral lean;Sit to/from stand       Functional mobility during ADLs: Moderate assistance;Cueing for safety;Cueing for sequencing General ADL Comments: Prior to this admission, patient living with his girlfriend, does not drive, and uses a cane for ambulation. Patient has progressed in his indpendence with his ADLs but does have assist when needed. Currently, patient with increased R shoulder pain, and requiring significant assist for bed mobility (will be sleeping in a recliner at home) and mod A for ADLs. Patient with excellent recall of shoulder precautions from previous shoulder surgery, but requiring more assist due to his prosthetic leg and completing lower body dressing with one hand. OT recommending HHOT at discharge, OT will continue to follow acutely.     Vision Baseline Vision/History: 1 Wears glasses Ability to See in Adequate Light: 0 Adequate Patient Visual Report: No change from baseline Vision Assessment?: No apparent visual deficits     Perception Perception: Not tested       Praxis Praxis: Not tested       Pertinent Vitals/Pain Pain Assessment Pain Assessment: Faces Faces Pain Scale: Hurts whole lot Pain Location: R shoulder Pain Descriptors / Indicators: Discomfort, Guarding, Grimacing Pain Intervention(s): Limited activity within patient's tolerance, Monitored during session, Premedicated before session, Repositioned     Extremity/Trunk Assessment Upper Extremity Assessment Upper Extremity Assessment: RUE deficits/detail RUE Deficits / Details: R shoulder in sling, sensation intact, able to complete elbow, wrist, and hand exercises with good demonstration RUE: Shoulder pain with ROM;Shoulder pain at rest RUE Sensation: WNL RUE Coordination: decreased fine motor;decreased gross motor   Lower Extremity Assessment Lower Extremity Assessment: Defer to PT evaluation   Cervical / Trunk Assessment Cervical / Trunk Assessment:  Kyphotic (minimally)   Communication Communication Communication: No apparent difficulties   Cognition Arousal: Alert Behavior During Therapy: WFL for tasks assessed/performed Cognition: No apparent impairments                               Following commands: Intact       Cueing  General Comments   Cueing Techniques: Verbal cues      Exercises Exercises: Shoulder Shoulder Exercises Pendulum Exercise: AROM, Right, 5 reps Elbow Flexion: AAROM, 10 reps, Right Elbow Extension: AAROM, 10 reps, Right Wrist Flexion: Right, 10 reps, AROM Wrist Extension: AROM, 10 reps, Right Digit Composite Flexion: AROM, 10 reps, Right Composite Extension: AROM, 10 reps, Right Neck Flexion: AROM, 5 reps, Seated Neck Extension: AROM, 5 reps, Seated   Shoulder Instructions Shoulder Instructions Donning/doffing shirt without moving shoulder: Moderate assistance Method for sponge bathing under operated UE: Moderate assistance Donning/doffing sling/immobilizer: Moderate assistance Correct positioning of sling/immobilizer: Moderate assistance Pendulum exercises (written home exercise program): Set-up ROM for elbow, wrist and digits of operated UE: Set-up Sling wearing schedule (on at all times/off for ADL's): Set-up Proper positioning of operated UE when showering: Set-up Positioning of UE while sleeping: Set-up    Home Living Family/patient expects to be discharged to:: Private residence Living Arrangements: Spouse/significant other Available Help at Discharge: Available 24 hours/day;Friend(s) Type of Home: Apartment Home Access: Level entry     Home Layout: One level     Bathroom Shower/Tub: Producer, Television/film/video: Handicapped height Bathroom Accessibility: Yes   Home Equipment: Agricultural Consultant (2 wheels);Rollator (4 wheels);Cane -  single point;Shower seat;Grab bars - toilet;Grab bars - tub/shower   Additional Comments: Plans to sleep in a recliner       Prior Functioning/Environment Prior Level of Function : Needs assist;History of Falls (last six months)             Mobility Comments: uses a cane, has a rollator and RW if needed ADLs Comments: has been mod I, but has assist if needed    OT Problem List: Decreased strength;Decreased range of motion;Decreased activity tolerance;Impaired balance (sitting and/or standing);Decreased coordination;Impaired UE functional use;Pain   OT Treatment/Interventions: Self-care/ADL training;Therapeutic exercise;Energy conservation;DME and/or AE instruction;Manual therapy;Therapeutic activities;Patient/family education;Balance training      OT Goals(Current goals can be found in the care plan section)   Acute Rehab OT Goals Patient Stated Goal: to get better OT Goal Formulation: With patient Time For Goal Achievement: 07/09/24 Potential to Achieve Goals: Good ADL Goals Pt Will Perform Upper Body Bathing: with modified independence;sitting Pt Will Perform Lower Body Bathing: with modified independence;sit to/from stand;sitting/lateral leans Pt Will Perform Upper Body Dressing: with modified independence;sitting Pt Will Perform Lower Body Dressing: with modified independence;sit to/from stand;sitting/lateral leans Pt Will Transfer to Toilet: with modified independence;ambulating;regular height toilet Pt Will Perform Toileting - Clothing Manipulation and hygiene: with modified independence;sitting/lateral leans;sit to/from stand Pt/caregiver will Perform Home Exercise Program: Increased ROM;Right Upper extremity;With written HEP provided;Independently   OT Frequency:  7X/week    Co-evaluation              AM-PAC OT 6 Clicks Daily Activity     Outcome Measure Help from another person eating meals?: A Little Help from another person taking care of personal grooming?: A Little Help from another person toileting, which includes using toliet, bedpan, or urinal?: A Lot Help from another  person bathing (including washing, rinsing, drying)?: A Lot Help from another person to put on and taking off regular upper body clothing?: A Lot Help from another person to put on and taking off regular lower body clothing?: A Lot 6 Click Score: 14   End of Session Equipment Utilized During Treatment: Rolling walker (2 wheels);Other (comment) (Sling) Nurse Communication: Mobility status (sitting EOB)  Activity Tolerance: Patient tolerated treatment well Patient left: in bed;with call bell/phone within reach  OT Visit Diagnosis: Unsteadiness on feet (R26.81);Other abnormalities of gait and mobility (R26.89);Repeated falls (R29.6);Muscle weakness (generalized) (M62.81);History of falling (Z91.81);Pain Pain - Right/Left: Right Pain - part of body: Shoulder                Time: 9162-9089 OT Time Calculation (min): 33 min Charges:  OT General Charges $OT Visit: 1 Visit OT Evaluation $OT Eval Moderate Complexity: 1 Mod OT Treatments $Self Care/Home Management : 8-22 mins  Ronal Gift E. Prentiss Hammett, OTR/L Acute Rehabilitation Services [phone]   Ronal Gift Salt 06/25/2024, 9:58 AM

## 2024-06-25 NOTE — Evaluation (Signed)
 Physical Therapy Evaluation  Patient Details Name: Travis KAUTZMAN Sr. MRN: [id number] DOB: 28-Feb-1951 Today's Date: 06/25/2024  History of Present Illness  Patient is a 73 yo male presenting to the hospital for  R reverse total shoulder on 06/24/24. PMH of RLE abscess, L reverse TSA on 03/07/21, R BKA, R ankle fusion, scolsis, S/P lubar fusion,  Bil TKA, Bil THA, DM, HTN, CAD, cardiac stents, PVD, angina, anxiety, depression, substance abuse, hepatits C, and fibromyalgia.   Clinical Impression  Pt admitted with above diagnosis. Pt currently with functional limitations due to the deficits listed below (see PT Problem List). At the time of PT eval pt was able to perform transfers and ambulation with CGA and SPC for support. Pt antalgic due to R BKA and prosthetic. Recommend pt limit ambulation distance unless with therapies, as he is at a high risk for falls at this time. Pt reports he can utilize his wheelchair when needed at d/c. Pt will benefit from acute skilled PT to increase their independence and safety with mobility to allow discharge.           If plan is discharge home, recommend the following: A little help with walking and/or transfers;A little help with bathing/dressing/bathroom;Assistance with cooking/housework;Assist for transportation   Can travel by private vehicle        Equipment Recommendations None recommended by PT  Recommendations for Other Services       Functional Status Assessment Patient has had a recent decline in their functional status and demonstrates the ability to make significant improvements in function in a reasonable and predictable amount of time.     Precautions / Restrictions Precautions Precautions: Shoulder Shoulder Interventions: Shoulder sling/immobilizer Precaution Booklet Issued: Yes (comment) Recall of Precautions/Restrictions: Intact Precaution/Restrictions Comments: AROM to elbow, wrist, and hand permitted, sling at all times except for  ADLs and exercies, ER 0 to 30, pendulums permitted Restrictions Weight Bearing Restrictions Per Provider Order: Yes RUE Weight Bearing Per Provider Order: Non weight bearing      Mobility  Bed Mobility Overal bed mobility: Needs Assistance Bed Mobility: Supine to Sit     Supine to sit: Min assist     General bed mobility comments: HOB elevated. Min assist for support as pt elevated trunk to full sitting position.    Transfers Overall transfer level: Needs assistance Equipment used: Straight cane Transfers: Sit to/from Stand Sit to Stand: Contact guard assist           General transfer comment: SPC to power up to full stand. No assist required but close guard for safety provided.    Ambulation/Gait Ambulation/Gait assistance: Contact guard assist Gait Distance (Feet): 25 Feet Assistive device: Straight cane Gait Pattern/deviations: Antalgic, Trunk flexed, Wide base of support, Decreased weight shift to right Gait velocity: Decreased Gait velocity interpretation: <1.31 ft/sec, indicative of household ambulator   General Gait Details: VC's for improved posture. Pt using cane in L hand however used to using it in te R hand. Pt reports he has a wheelchair he can use when needed and to get in the house today.  Stairs            Wheelchair Mobility     Tilt Bed    Modified Rankin (Stroke Patients Only)       Balance Overall balance assessment: Needs assistance Sitting-balance support: Single extremity supported, Feet supported Sitting balance-Leahy Scale: Fair Sitting balance - Comments: did not challenge   Standing balance support: Single extremity supported, During functional activity,  Reliant on assistive device for balance Standing balance-Leahy Scale: Poor Standing balance comment: reliant on external assist                             Pertinent Vitals/Pain Pain Assessment Pain Assessment: Faces Faces Pain Scale: Hurts even more Pain  Location: R shoulder Pain Descriptors / Indicators: Discomfort, Guarding, Grimacing Pain Intervention(s): Limited activity within patient's tolerance, Monitored during session, Repositioned    Home Living Family/patient expects to be discharged to:: Private residence Living Arrangements: Spouse/significant other Available Help at Discharge: Available 24 hours/day;Friend(s) Type of Home: Apartment Home Access: Level entry       Home Layout: One level Home Equipment: Agricultural Consultant (2 wheels);Rollator (4 wheels);Cane - single point;Shower seat;Grab bars - toilet;Grab bars - tub/shower Additional Comments: Plans to sleep in a recliner    Prior Function Prior Level of Function : Needs assist;History of Falls (last six months)             Mobility Comments: uses a cane, has a rollator and RW if needed ADLs Comments: has been mod I, but has assist if needed     Extremity/Trunk Assessment   Upper Extremity Assessment Upper Extremity Assessment: RUE deficits/detail RUE Deficits / Details: R shoulder in sling, sensation intact, able to complete elbow, wrist, and hand exercises with good demonstration RUE: Shoulder pain with ROM;Shoulder pain at rest RUE Sensation: WNL RUE Coordination: decreased fine motor;decreased gross motor    Lower Extremity Assessment Lower Extremity Assessment: RLE deficits/detail RLE Deficits / Details: R BKA with prosthesis donned    Cervical / Trunk Assessment Cervical / Trunk Assessment: Kyphotic (minimally)  Communication   Communication Communication: No apparent difficulties    Cognition Arousal: Alert Behavior During Therapy: WFL for tasks assessed/performed   PT - Cognitive impairments: No apparent impairments                         Following commands: Intact       Cueing Cueing Techniques: Verbal cues     General Comments      Exercises     Assessment/Plan    PT Assessment Patient needs continued PT services   PT Problem List Decreased strength;Decreased range of motion;Decreased activity tolerance;Decreased balance;Decreased mobility;Decreased knowledge of use of DME;Decreased safety awareness;Decreased knowledge of precautions;Pain       PT Treatment Interventions DME instruction;Gait training;Stair training;Functional mobility training;Therapeutic activities;Therapeutic exercise;Balance training;Patient/family education    PT Goals (Current goals can be found in the Care Plan section)  Acute Rehab PT Goals Patient Stated Goal: Home today PT Goal Formulation: With patient Time For Goal Achievement: 07/02/24 Potential to Achieve Goals: Good    Frequency Min 3X/week     Co-evaluation               AM-PAC PT 6 Clicks Mobility  Outcome Measure Help needed turning from your back to your side while in a flat bed without using bedrails?: A Little Help needed moving from lying on your back to sitting on the side of a flat bed without using bedrails?: A Little Help needed moving to and from a bed to a chair (including a wheelchair)?: A Little Help needed standing up from a chair using your arms (e.g., wheelchair or bedside chair)?: A Little Help needed to walk in hospital room?: A Little Help needed climbing 3-5 steps with a railing? : A Little 6 Click Score: 18  End of Session Equipment Utilized During Treatment: Gait belt Activity Tolerance: Patient tolerated treatment well Patient left: in bed;with call bell/phone within reach Nurse Communication: Mobility status PT Visit Diagnosis: Unsteadiness on feet (R26.81);Pain Pain - Right/Left: Right Pain - part of body: Shoulder    Time: 8962-8950 PT Time Calculation (min) (ACUTE ONLY): 12 min   Charges:   PT Evaluation $PT Eval Low Complexity: 1 Low   PT General Charges $$ ACUTE PT VISIT: 1 Visit         Leita Sable, PT, DPT Acute Rehabilitation Services Secure Chat Preferred Office: [phone]   Leita JONETTA Sable 06/25/2024, 12:04 PM

## 2024-06-25 NOTE — TOC Transition Note (Signed)
 Transition of Care [facility]) - Discharge Note   Patient Details  Name: Travis DAHLSTROM Sr. MRN: [id number] Date of Birth: 1950/11/06  Transition of Care [facility]) CM/SW Contact:  Andrez JULIANNA George, RN Phone Number: 06/25/2024, 10:11 AM   Clinical Narrative:     Pt is discharging home with resumption of home health services through [facility]. Information on the AVS. Pt was active with RN,PT prior to admission. [facility] aware OT has been added.  Pt has transportation home.  Final next level of care: Home w Home Health Services Barriers to Discharge: No Barriers Identified   Patient Goals and CMS Choice   CMS Medicare.gov Compare Post Acute Care list provided to:: Patient Choice offered to / list presented to : Patient      Discharge Placement                       Discharge Plan and Services Additional resources added to the After Visit Summary for                            [facility] Arranged: PT, OT HH Agency: [facility] Date [facility] Agency Contacted: 06/25/24 Time HH Agency Contacted: 1011 Representative spoke with at [facility] Agency: [place]  Social Drivers of Health (SDOH) Interventions SDOH Screenings   Food Insecurity: No Food Insecurity (05/15/2024)  Housing: High Risk (05/15/2024)  Transportation Needs: No Transportation Needs (05/15/2024)  Utilities: At Risk (05/15/2024)  Depression (PHQ2-9): Medium Risk (08/15/2023)  Financial Resource Strain: High Risk (10/10/2022)   Received from [facility]  Physical Activity: Unknown (10/10/2022)   Received from [facility]  Social Connections: Moderately Isolated (05/15/2024)  Stress: Stress Concern Present (10/10/2022)   Received from [facility]  Tobacco Use: Medium Risk (06/24/2024)     Readmission Risk Interventions     No data to display

## 2024-06-25 NOTE — Care Management Obs Status (Signed)
 MEDICARE OBSERVATION STATUS NOTIFICATION   Patient Details  Name: Travis COYE Sr. MRN: [id number] Date of Birth: [birth year]   Medicare Observation Status Notification Given:  Yes Obs notice signed and copy given.   Nashay Brickley 06/25/2024, 9:09 AM

## 2024-06-26 ENCOUNTER — Encounter (HOSPITAL_COMMUNITY): Payer: Self-pay | Admitting: Orthopedic Surgery

## 2024-06-27 ENCOUNTER — Telehealth: Payer: Self-pay | Admitting: Orthopedic Surgery

## 2024-06-27 ENCOUNTER — Encounter: Admitting: Surgical

## 2024-06-27 NOTE — Telephone Encounter (Signed)
 IC LMVM providing verbal orders.

## 2024-06-27 NOTE — Telephone Encounter (Signed)
 Travis [name] called. She is an OT [facility]. She would like verbal OT orders for evaluation. Her cb# [phone]

## 2024-07-09 ENCOUNTER — Encounter: Admitting: Orthopedic Surgery

## 2024-10-24 ENCOUNTER — Ambulatory Visit: Admitting: Cardiovascular Disease
